# Patient Record
Sex: Female | Born: 1950 | Race: White | Hispanic: No | Smoking: Never smoker
Health system: Southern US, Community
[De-identification: ages and names within clinical notes are randomized; demographics above are authoritative.]

## PROBLEM LIST (undated history)

## (undated) DIAGNOSIS — D649 Anemia, unspecified: Secondary | ICD-10-CM

## (undated) DIAGNOSIS — A0472 Enterocolitis due to Clostridium difficile, not specified as recurrent: Secondary | ICD-10-CM

## (undated) DIAGNOSIS — E039 Hypothyroidism, unspecified: Secondary | ICD-10-CM

## (undated) DIAGNOSIS — T783XXA Angioneurotic edema, initial encounter: Secondary | ICD-10-CM

## (undated) DIAGNOSIS — M542 Cervicalgia: Secondary | ICD-10-CM

## (undated) DIAGNOSIS — T4145XA Adverse effect of unspecified anesthetic, initial encounter: Secondary | ICD-10-CM

## (undated) DIAGNOSIS — K589 Irritable bowel syndrome without diarrhea: Secondary | ICD-10-CM

## (undated) DIAGNOSIS — E7800 Pure hypercholesterolemia, unspecified: Secondary | ICD-10-CM

## (undated) DIAGNOSIS — R0902 Hypoxemia: Secondary | ICD-10-CM

## (undated) DIAGNOSIS — G4730 Sleep apnea, unspecified: Secondary | ICD-10-CM

## (undated) DIAGNOSIS — R6510 Systemic inflammatory response syndrome (SIRS) of non-infectious origin without acute organ dysfunction: Secondary | ICD-10-CM

## (undated) DIAGNOSIS — R51 Headache: Secondary | ICD-10-CM

## (undated) DIAGNOSIS — S92352D Displaced fracture of fifth metatarsal bone, left foot, subsequent encounter for fracture with routine healing: Secondary | ICD-10-CM

## (undated) DIAGNOSIS — G8929 Other chronic pain: Secondary | ICD-10-CM

## (undated) DIAGNOSIS — Z86010 Personal history of colonic polyps: Secondary | ICD-10-CM

## (undated) DIAGNOSIS — J479 Bronchiectasis, uncomplicated: Secondary | ICD-10-CM

## (undated) DIAGNOSIS — I82409 Acute embolism and thrombosis of unspecified deep veins of unspecified lower extremity: Secondary | ICD-10-CM

## (undated) DIAGNOSIS — M545 Low back pain: Secondary | ICD-10-CM

## (undated) DIAGNOSIS — T8859XA Other complications of anesthesia, initial encounter: Secondary | ICD-10-CM

## (undated) DIAGNOSIS — K31819 Angiodysplasia of stomach and duodenum without bleeding: Secondary | ICD-10-CM

## (undated) DIAGNOSIS — J189 Pneumonia, unspecified organism: Secondary | ICD-10-CM

## (undated) DIAGNOSIS — F32A Depression, unspecified: Secondary | ICD-10-CM

## (undated) DIAGNOSIS — C50919 Malignant neoplasm of unspecified site of unspecified female breast: Secondary | ICD-10-CM

## (undated) DIAGNOSIS — J45909 Unspecified asthma, uncomplicated: Secondary | ICD-10-CM

## (undated) DIAGNOSIS — F329 Major depressive disorder, single episode, unspecified: Secondary | ICD-10-CM

## (undated) DIAGNOSIS — M1990 Unspecified osteoarthritis, unspecified site: Secondary | ICD-10-CM

## (undated) DIAGNOSIS — K5790 Diverticulosis of intestine, part unspecified, without perforation or abscess without bleeding: Secondary | ICD-10-CM

## (undated) DIAGNOSIS — R0609 Other forms of dyspnea: Secondary | ICD-10-CM

## (undated) DIAGNOSIS — T7840XA Allergy, unspecified, initial encounter: Secondary | ICD-10-CM

## (undated) DIAGNOSIS — E0500 Thyrotoxicosis with diffuse goiter without thyrotoxic crisis or storm: Secondary | ICD-10-CM

## (undated) DIAGNOSIS — M797 Fibromyalgia: Secondary | ICD-10-CM

## (undated) DIAGNOSIS — R0600 Dyspnea, unspecified: Secondary | ICD-10-CM

## (undated) DIAGNOSIS — M0600 Rheumatoid arthritis without rheumatoid factor, unspecified site: Secondary | ICD-10-CM

## (undated) DIAGNOSIS — K219 Gastro-esophageal reflux disease without esophagitis: Secondary | ICD-10-CM

## (undated) DIAGNOSIS — J398 Other specified diseases of upper respiratory tract: Secondary | ICD-10-CM

## (undated) DIAGNOSIS — K449 Diaphragmatic hernia without obstruction or gangrene: Secondary | ICD-10-CM

## (undated) DIAGNOSIS — I82401 Acute embolism and thrombosis of unspecified deep veins of right lower extremity: Secondary | ICD-10-CM

## (undated) DIAGNOSIS — J069 Acute upper respiratory infection, unspecified: Secondary | ICD-10-CM

## (undated) DIAGNOSIS — H269 Unspecified cataract: Secondary | ICD-10-CM

## (undated) DIAGNOSIS — F419 Anxiety disorder, unspecified: Secondary | ICD-10-CM

## (undated) DIAGNOSIS — I2510 Atherosclerotic heart disease of native coronary artery without angina pectoris: Secondary | ICD-10-CM

## (undated) DIAGNOSIS — J4540 Moderate persistent asthma, uncomplicated: Secondary | ICD-10-CM

## (undated) DIAGNOSIS — I509 Heart failure, unspecified: Secondary | ICD-10-CM

## (undated) HISTORY — DX: Anxiety disorder, unspecified: F41.9

## (undated) HISTORY — DX: Rheumatoid arthritis without rheumatoid factor, unspecified site: M06.00

## (undated) HISTORY — PX: SPINE SURGERY: SHX786

## (undated) HISTORY — PX: BREAST SURGERY: SHX581

## (undated) HISTORY — DX: Irritable bowel syndrome, unspecified: K58.9

## (undated) HISTORY — DX: Allergy, unspecified, initial encounter: T78.40XA

## (undated) HISTORY — PX: OTHER SURGICAL HISTORY: SHX169

## (undated) HISTORY — DX: Diverticulosis of intestine, part unspecified, without perforation or abscess without bleeding: K57.90

## (undated) HISTORY — PX: EYE SURGERY: SHX253

## (undated) HISTORY — PX: KNEE ARTHROPLASTY: SHX992

## (undated) HISTORY — PX: NASAL SEPTUM SURGERY: SHX37

## (undated) HISTORY — DX: Personal history of colonic polyps: Z86.010

## (undated) HISTORY — DX: Other chronic pain: G89.29

## (undated) HISTORY — PX: CARPOMETACARPEL (CMC) FUSION OF THUMB WITH AUTOGRAFT FROM RADIUS: SHX5767

## (undated) HISTORY — DX: Gastro-esophageal reflux disease without esophagitis: K21.9

## (undated) HISTORY — DX: Diaphragmatic hernia without obstruction or gangrene: K44.9

## (undated) HISTORY — DX: Moderate persistent asthma, uncomplicated: J45.40

## (undated) HISTORY — PX: COLONOSCOPY: SHX174

## (undated) HISTORY — PX: ABDOMINAL HYSTERECTOMY: SHX81

## (undated) HISTORY — DX: Angiodysplasia of stomach and duodenum without bleeding: K31.819

## (undated) HISTORY — DX: Hypothyroidism, unspecified: E03.9

## (undated) HISTORY — DX: Angioneurotic edema, initial encounter: T78.3XXA

## (undated) HISTORY — PX: ANTERIOR AND POSTERIOR REPAIR: SHX5121

## (undated) HISTORY — DX: Acute upper respiratory infection, unspecified: J06.9

## (undated) HISTORY — PX: CHOLECYSTECTOMY: SHX55

## (undated) HISTORY — PX: TONSILLECTOMY: SUR1361

## (undated) HISTORY — DX: Other specified diseases of upper respiratory tract: J39.8

## (undated) HISTORY — PX: DEBRIDEMENT TENNIS ELBOW: SHX1442

## (undated) HISTORY — DX: Acute embolism and thrombosis of unspecified deep veins of unspecified lower extremity: I82.409

## (undated) HISTORY — DX: Unspecified asthma, uncomplicated: J45.909

## (undated) HISTORY — DX: Hypoxemia: R09.02

## (undated) HISTORY — DX: Cervicalgia: M54.2

## (undated) HISTORY — DX: Bronchiectasis, uncomplicated: J47.9

## (undated) HISTORY — DX: Anemia, unspecified: D64.9

## (undated) HISTORY — DX: Heart failure, unspecified: I50.9

## (undated) HISTORY — PX: APPENDECTOMY: SHX54

## (undated) HISTORY — PX: ESOPHAGOGASTRODUODENOSCOPY: SHX1529

## (undated) HISTORY — DX: Unspecified cataract: H26.9

## (undated) HISTORY — PX: JOINT REPLACEMENT: SHX530

## (undated) HISTORY — DX: Malignant neoplasm of unspecified site of unspecified female breast: C50.919

---
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
## 1978-10-21 HISTORY — PX: NASAL SEPTUM SURGERY: SHX37

## 1986-02-19 HISTORY — PX: VESICOVAGINAL FISTULA CLOSURE W/ TAH: SUR271

## 1988-10-20 HISTORY — PX: ANTERIOR AND POSTERIOR REPAIR: SHX5121

## 1996-02-20 DIAGNOSIS — C50919 Malignant neoplasm of unspecified site of unspecified female breast: Secondary | ICD-10-CM

## 1996-02-20 HISTORY — DX: Malignant neoplasm of unspecified site of unspecified female breast: C50.919

## 1996-02-20 HISTORY — PX: BREAST LUMPECTOMY: SHX2

## 1997-07-30 ENCOUNTER — Other Ambulatory Visit: Admission: RE | Admit: 1997-07-30 | Discharge: 1997-07-30 | Payer: Self-pay | Admitting: Obstetrics and Gynecology

## 1997-10-27 ENCOUNTER — Encounter: Admission: RE | Admit: 1997-10-27 | Discharge: 1998-01-25 | Payer: Self-pay | Admitting: *Deleted

## 1998-01-27 ENCOUNTER — Encounter: Admission: RE | Admit: 1998-01-27 | Discharge: 1998-04-27 | Payer: Self-pay | Admitting: *Deleted

## 1998-08-02 ENCOUNTER — Other Ambulatory Visit: Admission: RE | Admit: 1998-08-02 | Discharge: 1998-08-02 | Payer: Self-pay | Admitting: Obstetrics and Gynecology

## 1998-08-03 ENCOUNTER — Encounter: Admission: RE | Admit: 1998-08-03 | Discharge: 1998-08-18 | Payer: Self-pay | Admitting: *Deleted

## 1999-02-23 ENCOUNTER — Ambulatory Visit: Admission: RE | Admit: 1999-02-23 | Discharge: 1999-02-23 | Payer: Self-pay | Admitting: Internal Medicine

## 1999-07-26 ENCOUNTER — Encounter: Payer: Self-pay | Admitting: Hematology and Oncology

## 1999-07-26 ENCOUNTER — Ambulatory Visit (HOSPITAL_COMMUNITY): Admission: RE | Admit: 1999-07-26 | Discharge: 1999-07-26 | Payer: Self-pay | Admitting: Hematology and Oncology

## 1999-08-03 ENCOUNTER — Encounter: Admission: RE | Admit: 1999-08-03 | Discharge: 1999-08-03 | Payer: Self-pay | Admitting: Hematology and Oncology

## 1999-08-03 ENCOUNTER — Encounter: Payer: Self-pay | Admitting: Hematology and Oncology

## 1999-08-03 ENCOUNTER — Other Ambulatory Visit: Admission: RE | Admit: 1999-08-03 | Discharge: 1999-08-03 | Payer: Self-pay | Admitting: *Deleted

## 1999-10-18 ENCOUNTER — Encounter: Payer: Self-pay | Admitting: Hematology and Oncology

## 1999-10-18 ENCOUNTER — Ambulatory Visit (HOSPITAL_COMMUNITY): Admission: RE | Admit: 1999-10-18 | Discharge: 1999-10-18 | Payer: Self-pay | Admitting: Hematology and Oncology

## 2000-06-05 ENCOUNTER — Encounter: Admission: RE | Admit: 2000-06-05 | Discharge: 2000-09-03 | Payer: Self-pay | Admitting: Neurological Surgery

## 2000-08-05 ENCOUNTER — Encounter: Payer: Self-pay | Admitting: *Deleted

## 2000-08-05 ENCOUNTER — Encounter: Admission: RE | Admit: 2000-08-05 | Discharge: 2000-08-05 | Payer: Self-pay | Admitting: *Deleted

## 2000-08-05 ENCOUNTER — Other Ambulatory Visit: Admission: RE | Admit: 2000-08-05 | Discharge: 2000-08-05 | Payer: Self-pay | Admitting: *Deleted

## 2000-09-04 ENCOUNTER — Encounter: Admission: RE | Admit: 2000-09-04 | Discharge: 2000-09-17 | Payer: Self-pay | Admitting: Neurological Surgery

## 2000-10-22 ENCOUNTER — Encounter: Admission: RE | Admit: 2000-10-22 | Discharge: 2000-11-11 | Payer: Self-pay | Admitting: *Deleted

## 2000-11-29 ENCOUNTER — Encounter: Payer: Self-pay | Admitting: Neurological Surgery

## 2000-11-29 ENCOUNTER — Ambulatory Visit (HOSPITAL_COMMUNITY): Admission: RE | Admit: 2000-11-29 | Discharge: 2000-11-29 | Payer: Self-pay | Admitting: Neurological Surgery

## 2000-12-03 ENCOUNTER — Encounter: Payer: Self-pay | Admitting: Internal Medicine

## 2001-01-17 ENCOUNTER — Encounter: Payer: Self-pay | Admitting: Internal Medicine

## 2001-02-03 ENCOUNTER — Encounter: Payer: Self-pay | Admitting: Neurosurgery

## 2001-02-03 ENCOUNTER — Ambulatory Visit (HOSPITAL_COMMUNITY): Admission: RE | Admit: 2001-02-03 | Discharge: 2001-02-03 | Payer: Self-pay | Admitting: Neurosurgery

## 2001-02-10 ENCOUNTER — Encounter: Payer: Self-pay | Admitting: Internal Medicine

## 2001-02-10 ENCOUNTER — Ambulatory Visit (HOSPITAL_COMMUNITY): Admission: RE | Admit: 2001-02-10 | Discharge: 2001-02-10 | Payer: Self-pay | Admitting: Gastroenterology

## 2001-02-19 HISTORY — PX: CERVICAL FUSION: SHX112

## 2001-03-05 ENCOUNTER — Encounter: Payer: Self-pay | Admitting: Neurosurgery

## 2001-03-10 ENCOUNTER — Encounter: Payer: Self-pay | Admitting: Neurosurgery

## 2001-03-10 ENCOUNTER — Ambulatory Visit (HOSPITAL_COMMUNITY): Admission: RE | Admit: 2001-03-10 | Discharge: 2001-03-11 | Payer: Self-pay | Admitting: Neurosurgery

## 2001-05-13 ENCOUNTER — Encounter: Payer: Self-pay | Admitting: Neurosurgery

## 2001-05-13 ENCOUNTER — Ambulatory Visit (HOSPITAL_COMMUNITY): Admission: RE | Admit: 2001-05-13 | Discharge: 2001-05-13 | Payer: Self-pay | Admitting: Neurosurgery

## 2001-08-06 ENCOUNTER — Encounter: Admission: RE | Admit: 2001-08-06 | Discharge: 2001-08-06 | Payer: Self-pay | Admitting: *Deleted

## 2001-08-06 ENCOUNTER — Other Ambulatory Visit: Admission: RE | Admit: 2001-08-06 | Discharge: 2001-08-06 | Payer: Self-pay | Admitting: Obstetrics and Gynecology

## 2001-08-06 ENCOUNTER — Encounter: Payer: Self-pay | Admitting: *Deleted

## 2001-10-20 HISTORY — PX: CERVICAL DISCECTOMY: SHX98

## 2002-02-19 HISTORY — PX: POSTERIOR CERVICAL FUSION/FORAMINOTOMY: SHX5038

## 2002-08-10 ENCOUNTER — Encounter: Admission: RE | Admit: 2002-08-10 | Discharge: 2002-08-10 | Payer: Self-pay | Admitting: Obstetrics and Gynecology

## 2002-08-10 ENCOUNTER — Other Ambulatory Visit: Admission: RE | Admit: 2002-08-10 | Discharge: 2002-08-10 | Payer: Self-pay | Admitting: Obstetrics and Gynecology

## 2002-08-10 ENCOUNTER — Encounter: Payer: Self-pay | Admitting: Obstetrics and Gynecology

## 2002-11-19 ENCOUNTER — Encounter: Admission: RE | Admit: 2002-11-19 | Discharge: 2002-11-19 | Payer: Self-pay | Admitting: Neurosurgery

## 2002-11-19 ENCOUNTER — Encounter: Payer: Self-pay | Admitting: Neurosurgery

## 2003-04-12 ENCOUNTER — Encounter: Admission: RE | Admit: 2003-04-12 | Discharge: 2003-04-12 | Payer: Self-pay | Admitting: Neurosurgery

## 2003-08-12 ENCOUNTER — Encounter: Admission: RE | Admit: 2003-08-12 | Discharge: 2003-08-12 | Payer: Self-pay | Admitting: Obstetrics and Gynecology

## 2004-02-29 ENCOUNTER — Encounter: Admission: RE | Admit: 2004-02-29 | Discharge: 2004-02-29 | Payer: Self-pay | Admitting: Neurosurgery

## 2004-03-08 ENCOUNTER — Ambulatory Visit: Payer: Self-pay | Admitting: Internal Medicine

## 2004-04-26 ENCOUNTER — Ambulatory Visit: Payer: Self-pay | Admitting: Internal Medicine

## 2004-05-05 ENCOUNTER — Ambulatory Visit: Payer: Self-pay | Admitting: Internal Medicine

## 2004-06-05 ENCOUNTER — Ambulatory Visit: Payer: Self-pay | Admitting: Internal Medicine

## 2004-08-14 ENCOUNTER — Encounter: Admission: RE | Admit: 2004-08-14 | Discharge: 2004-08-14 | Payer: Self-pay | Admitting: Obstetrics and Gynecology

## 2004-11-06 ENCOUNTER — Ambulatory Visit: Payer: Self-pay | Admitting: Internal Medicine

## 2005-03-14 ENCOUNTER — Encounter: Payer: Self-pay | Admitting: Internal Medicine

## 2005-04-20 ENCOUNTER — Encounter: Payer: Self-pay | Admitting: Internal Medicine

## 2005-04-20 LAB — HM COLONOSCOPY

## 2005-06-05 ENCOUNTER — Ambulatory Visit (HOSPITAL_COMMUNITY): Admission: RE | Admit: 2005-06-05 | Discharge: 2005-06-05 | Payer: Self-pay | Admitting: Family Medicine

## 2005-08-23 ENCOUNTER — Encounter: Admission: RE | Admit: 2005-08-23 | Discharge: 2005-08-23 | Payer: Self-pay | Admitting: Obstetrics and Gynecology

## 2005-10-27 IMAGING — CR DG MYELOGRAM CERVICAL
5 series · 5 of 5 positions shown · IV contrast (omnipaque)
Comparison: none

CLINICAL DATA: Neck pain.  Bilateral shoulder and arm pain.  Back pain.  Bilateral leg pain in a patient with prior cervical fusion.
 CERVICAL MYELOGRAM
 A lumbar puncture was performed from a left sided approach to the midline at L2-3 using a 22 gauge spinal needle.  8 cc of Omnipaque 300 were instilled and directed into the cervical region by patient positioning.  
 The spinal canal appears widely patent.   I do not see any anterior extradural defects.  Ample subarachnoid space surrounds the cervical spinal cord.  The patient has a fusion done via anterior cervical diskectomy with anterior plate and screws at C3-4 which appears grossly solid.  The patient has had prior anterior and posterior fusion at C6-7 which appears grossly solid.  The nerve root sleeves fill normally bilaterally.  
 IMPRESSION
 Normal cervical myelogram in a patient who has had prior fusion at C3-4 and C6-7.  The spinal canal appears widely patent and there is no evidence of nerve root compression.
 POST MYELOGRAM CT SCAN ? CERVICAL SPINE
 Spiral scanning was performed from the skull base to T1.
 Foramen magnum and C1-2:  No abnormality.
 C2-3:  Normal interspace.  No facet disease.
 C3-4:  Prior anterior cervical diskectomy and fusion with an anterior plate.  The fusion appears solid.  The spinal canal and neural foramen are widely patent.  The facet joint on the left is solidly fused. There may also be fusion of the facet joint on the right.  
 C4-5:  There is a minor disk bulge that causes slight indentation upon the ventral subarachnoid space, but does not compress or deform the cord.  I do not see any foraminal compromise. There appears to be free exiting of the C5 nerve roots.  The facets do not show any degenerative change.  
 C5-6:  There is no disk bulge or herniation.  The canal and foramina are widely patent.  No evidence of facet disease. 
 C6-7:  The patient has had an old anterior fusion which appears solid.  There is a posterior fusion with wires around the spinous processes.  The facets appear solidly fused.  The spinal canal and foramina are widely patent.  
 C7-T1:  Normal interspace without evidence of canal or foraminal compromise.  The facets appear normal. 
 1.  Solid fusion at C3-4 with wide patency of the canal and foramina.
 2.  Small disk bulge at C4-5, but no herniation, stenosis, or foraminal compromise.  The facets appear normal. 
 3.  The C5-6 interspace appears normal.
 4.  Good appearance at the C6-7 level which appears solidly fused both anterior and posterior with wide patency of the canal and foramina.
 5.  The C7-T1 disk space is normal without evidence of secondary degenerative changes.
 CT MULTIPLANAR REFORMATION
 Sagittal and coronal reformations were done which aid in depiction of the above described findings.

[view not recorded (1 of 5)]
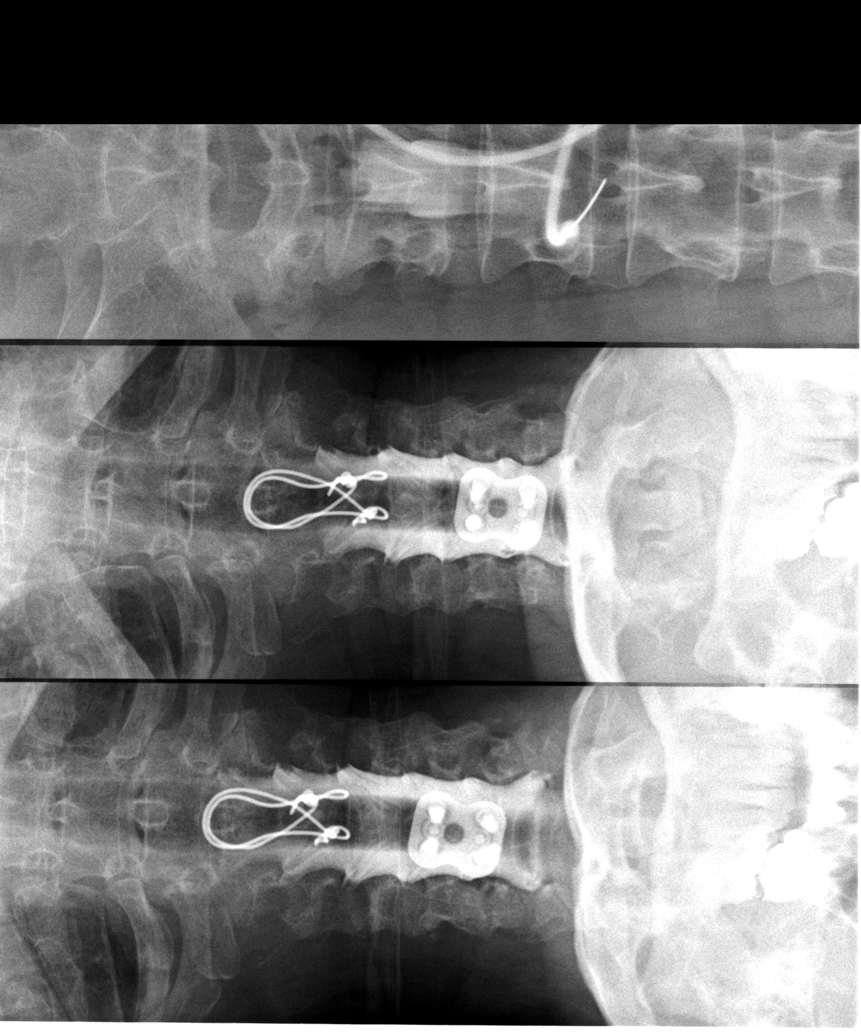

[view not recorded (2 of 5)]
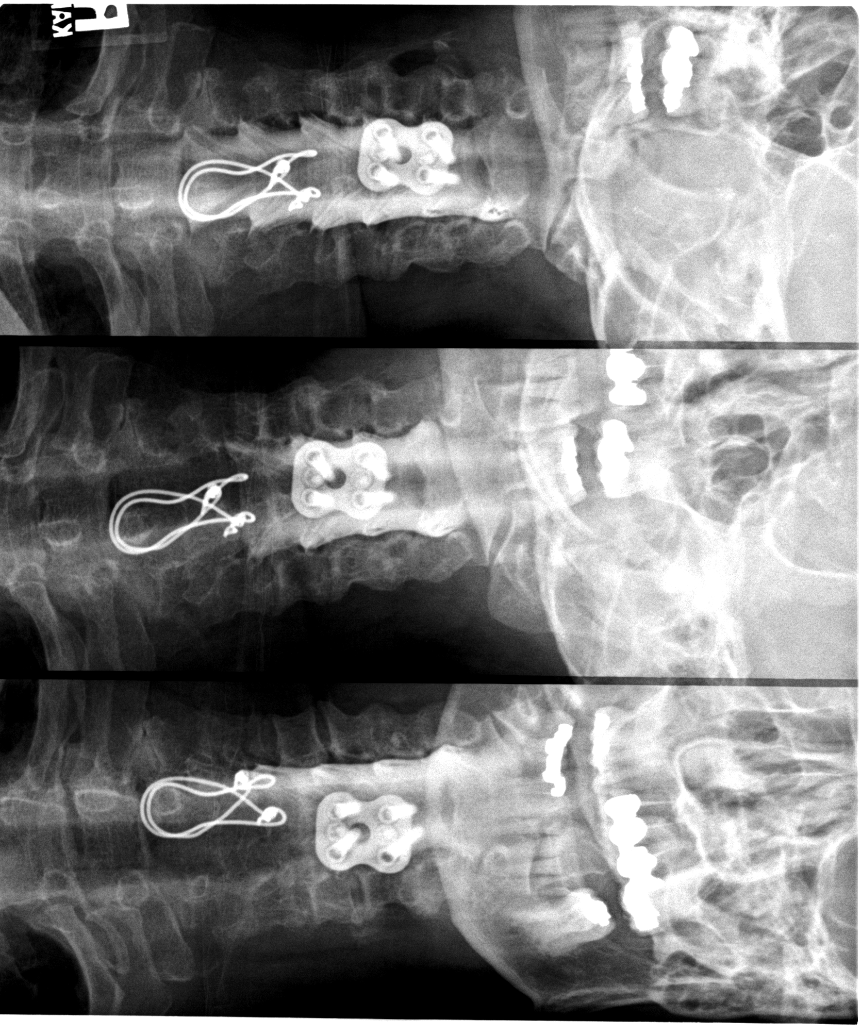

[view not recorded (3 of 5)]
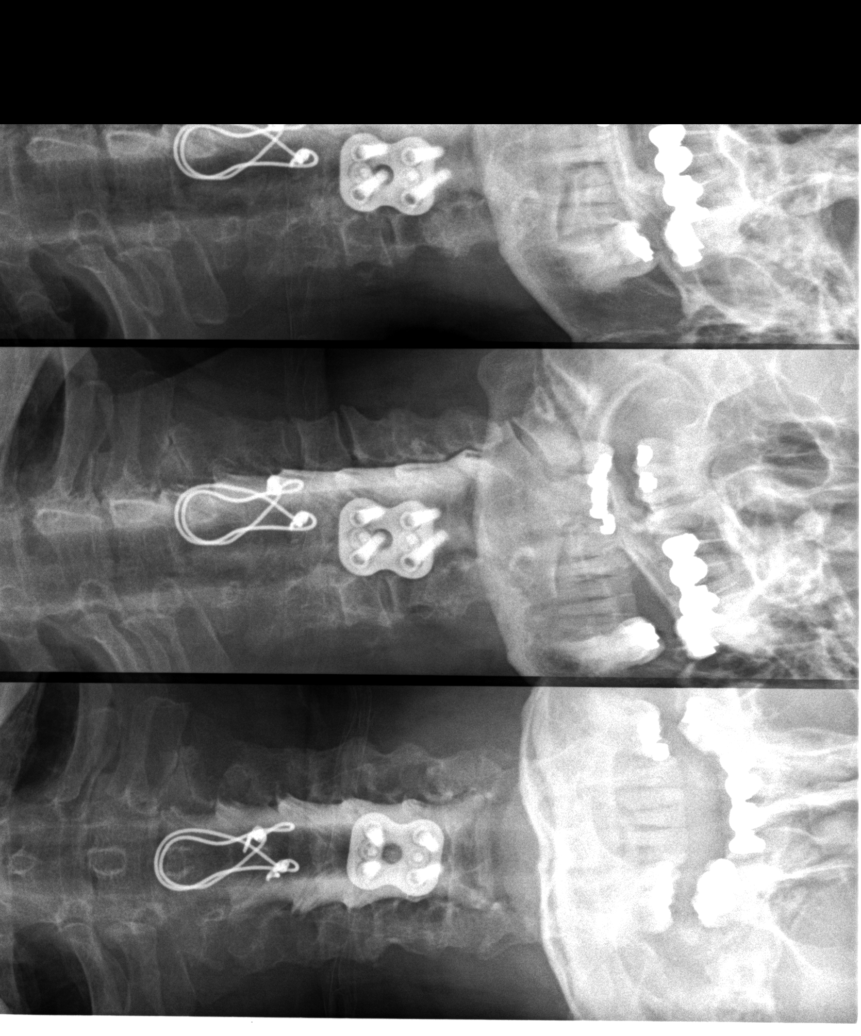

[view not recorded (4 of 5)]
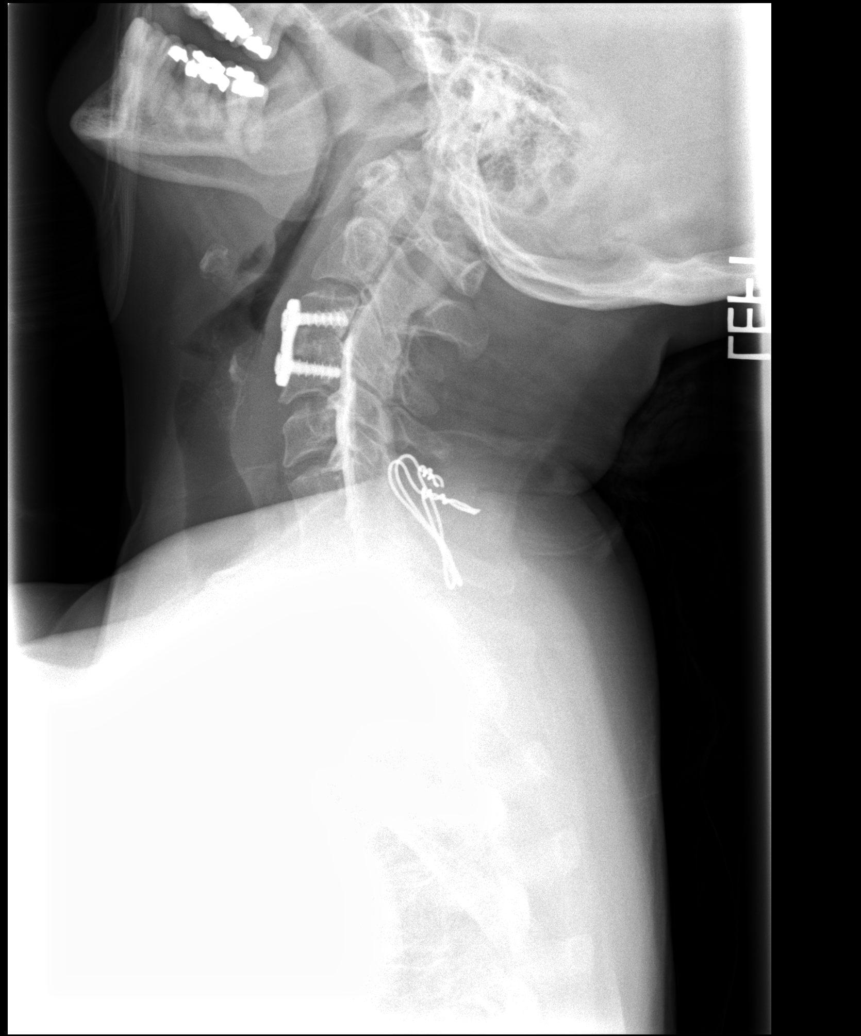

[view not recorded (5 of 5)]
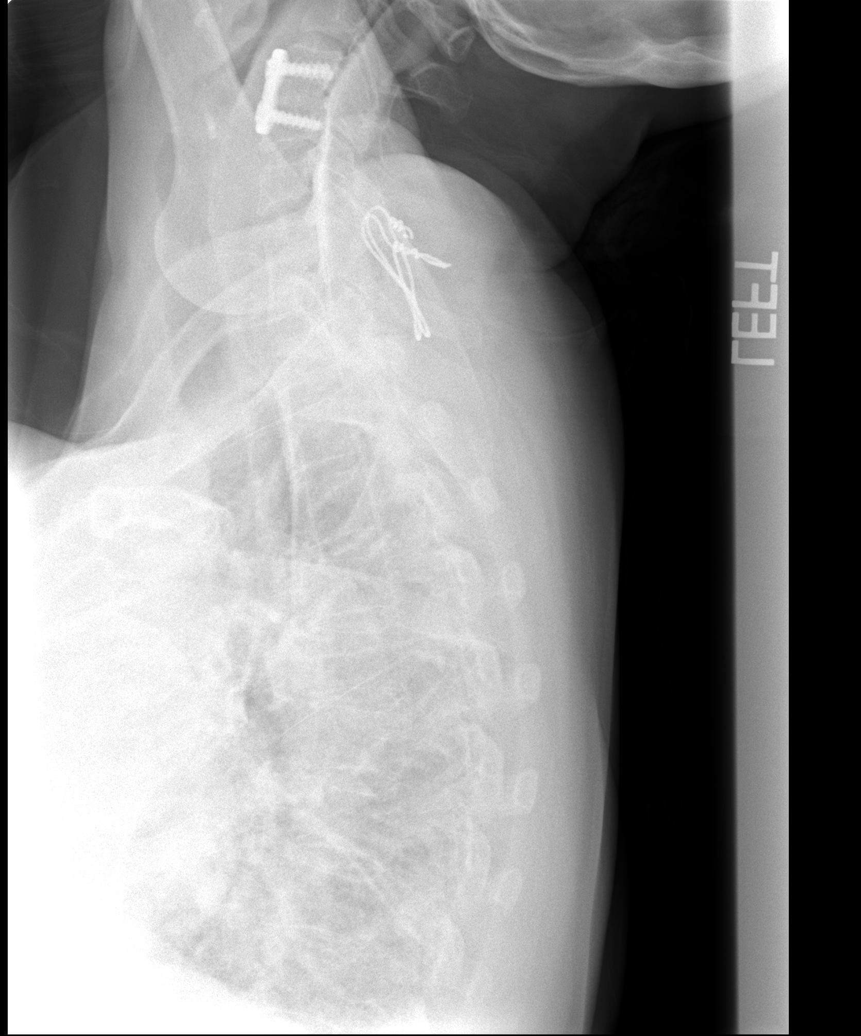

[5 of 5 positions shown; findings below may reference images not displayed]

## 2005-12-10 ENCOUNTER — Ambulatory Visit (HOSPITAL_COMMUNITY): Admission: RE | Admit: 2005-12-10 | Discharge: 2005-12-10 | Payer: Self-pay | Admitting: Family Medicine

## 2006-02-06 ENCOUNTER — Ambulatory Visit (HOSPITAL_COMMUNITY): Admission: RE | Admit: 2006-02-06 | Discharge: 2006-02-06 | Payer: Self-pay | Admitting: Anesthesiology

## 2006-09-02 ENCOUNTER — Encounter: Admission: RE | Admit: 2006-09-02 | Discharge: 2006-09-02 | Payer: Self-pay | Admitting: Obstetrics and Gynecology

## 2006-09-05 ENCOUNTER — Ambulatory Visit (HOSPITAL_COMMUNITY): Admission: RE | Admit: 2006-09-05 | Discharge: 2006-09-05 | Payer: Self-pay | Admitting: Family Medicine
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
## 2006-10-02 ENCOUNTER — Telehealth: Payer: Self-pay | Admitting: Internal Medicine

## 2006-11-12 DIAGNOSIS — M810 Age-related osteoporosis without current pathological fracture: Secondary | ICD-10-CM | POA: Insufficient documentation

## 2006-11-12 DIAGNOSIS — K219 Gastro-esophageal reflux disease without esophagitis: Secondary | ICD-10-CM | POA: Insufficient documentation

## 2006-11-12 DIAGNOSIS — J3089 Other allergic rhinitis: Secondary | ICD-10-CM | POA: Insufficient documentation

## 2006-11-12 DIAGNOSIS — E0590 Thyrotoxicosis, unspecified without thyrotoxic crisis or storm: Secondary | ICD-10-CM | POA: Insufficient documentation

## 2006-11-12 DIAGNOSIS — Z853 Personal history of malignant neoplasm of breast: Secondary | ICD-10-CM | POA: Insufficient documentation

## 2006-11-12 DIAGNOSIS — M542 Cervicalgia: Secondary | ICD-10-CM | POA: Insufficient documentation

## 2006-11-12 DIAGNOSIS — J302 Other seasonal allergic rhinitis: Secondary | ICD-10-CM | POA: Insufficient documentation

## 2007-01-06 ENCOUNTER — Encounter: Payer: Self-pay | Admitting: Internal Medicine

## 2007-03-27 ENCOUNTER — Encounter: Payer: Self-pay | Admitting: Internal Medicine

## 2007-03-31 ENCOUNTER — Ambulatory Visit (HOSPITAL_COMMUNITY): Admission: RE | Admit: 2007-03-31 | Discharge: 2007-03-31 | Payer: Self-pay | Admitting: Gastroenterology

## 2007-03-31 ENCOUNTER — Encounter: Payer: Self-pay | Admitting: Internal Medicine

## 2007-04-02 ENCOUNTER — Encounter: Payer: Self-pay | Admitting: Internal Medicine

## 2007-04-02 ENCOUNTER — Ambulatory Visit (HOSPITAL_COMMUNITY): Admission: RE | Admit: 2007-04-02 | Discharge: 2007-04-02 | Payer: Self-pay | Admitting: Gastroenterology

## 2007-04-11 ENCOUNTER — Encounter: Payer: Self-pay | Admitting: Internal Medicine

## 2007-04-11 ENCOUNTER — Ambulatory Visit (HOSPITAL_COMMUNITY): Admission: RE | Admit: 2007-04-11 | Discharge: 2007-04-11 | Payer: Self-pay | Admitting: Gastroenterology

## 2007-04-15 ENCOUNTER — Encounter: Payer: Self-pay | Admitting: Internal Medicine

## 2007-04-22 ENCOUNTER — Encounter: Payer: Self-pay | Admitting: Internal Medicine

## 2007-04-24 ENCOUNTER — Encounter: Payer: Self-pay | Admitting: Internal Medicine

## 2007-05-02 ENCOUNTER — Encounter: Payer: Self-pay | Admitting: Internal Medicine

## 2007-06-11 ENCOUNTER — Ambulatory Visit (HOSPITAL_COMMUNITY): Admission: RE | Admit: 2007-06-11 | Discharge: 2007-06-12 | Payer: Self-pay | Admitting: Surgery

## 2007-06-11 ENCOUNTER — Encounter (INDEPENDENT_AMBULATORY_CARE_PROVIDER_SITE_OTHER): Payer: Self-pay | Admitting: Surgery

## 2007-06-11 ENCOUNTER — Encounter: Payer: Self-pay | Admitting: Internal Medicine

## 2007-07-02 ENCOUNTER — Encounter: Payer: Self-pay | Admitting: Internal Medicine

## 2007-07-08 ENCOUNTER — Encounter: Payer: Self-pay | Admitting: Internal Medicine

## 2007-08-19 ENCOUNTER — Encounter: Payer: Self-pay | Admitting: Internal Medicine

## 2007-09-03 ENCOUNTER — Encounter: Admission: RE | Admit: 2007-09-03 | Discharge: 2007-09-03 | Payer: Self-pay | Admitting: Obstetrics and Gynecology

## 2007-09-10 ENCOUNTER — Encounter: Payer: Self-pay | Admitting: Internal Medicine

## 2007-09-10 ENCOUNTER — Encounter: Admission: RE | Admit: 2007-09-10 | Discharge: 2007-09-10 | Payer: Self-pay | Admitting: Obstetrics and Gynecology

## 2007-09-18 ENCOUNTER — Encounter: Admission: RE | Admit: 2007-09-18 | Discharge: 2007-09-18 | Payer: Self-pay | Admitting: Obstetrics and Gynecology

## 2007-10-21 ENCOUNTER — Ambulatory Visit (HOSPITAL_COMMUNITY): Admission: RE | Admit: 2007-10-21 | Discharge: 2007-10-21 | Payer: Self-pay | Admitting: Family Medicine

## 2007-12-16 ENCOUNTER — Encounter: Payer: Self-pay | Admitting: Internal Medicine
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
## 2008-02-03 ENCOUNTER — Encounter: Payer: Self-pay | Admitting: Internal Medicine

## 2008-02-20 LAB — HM DEXA SCAN

## 2008-03-12 ENCOUNTER — Encounter: Admission: RE | Admit: 2008-03-12 | Discharge: 2008-03-12 | Payer: Self-pay | Admitting: Obstetrics and Gynecology

## 2008-04-20 ENCOUNTER — Ambulatory Visit (HOSPITAL_BASED_OUTPATIENT_CLINIC_OR_DEPARTMENT_OTHER): Admission: RE | Admit: 2008-04-20 | Discharge: 2008-04-21 | Payer: Self-pay | Admitting: Orthopedic Surgery

## 2008-06-04 ENCOUNTER — Encounter: Payer: Self-pay | Admitting: Internal Medicine

## 2008-06-26 IMAGING — CT CT CHEST W/O CM
2 of 4 series · 15 of 36 positions shown, 18 images · IV contrast (agent unspecified)
Comparison: none

CLINICAL DATA: Multiple lung nodules seen on prior CT scan of 06/05/05. 
 CHEST CT WITHOUT CONTRAST:
TECHNIQUE: Multidetector CT imaging of the chest was performed following the standard protocol without IV contrast.

[Series 2: chest routine 5.0 b40f · axial · 0.59mm/px · z∈[-338,-68]mm · 12 of 64 slices shown, 15 images]
[im 5/64  mediastinal]
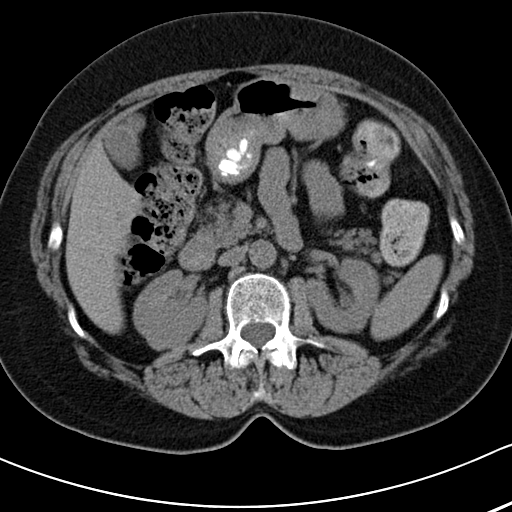
[im 5/64  lung]
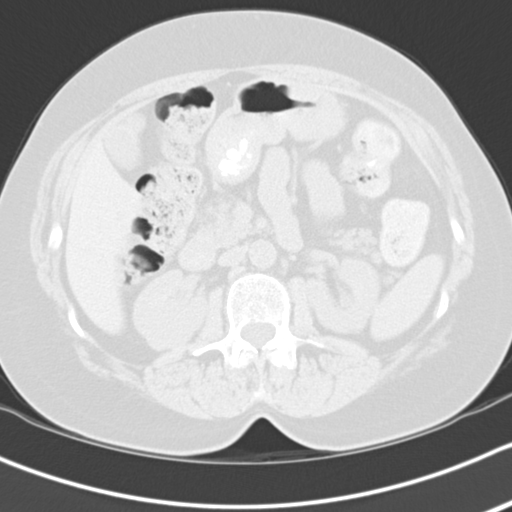
[im 10/64  lung]
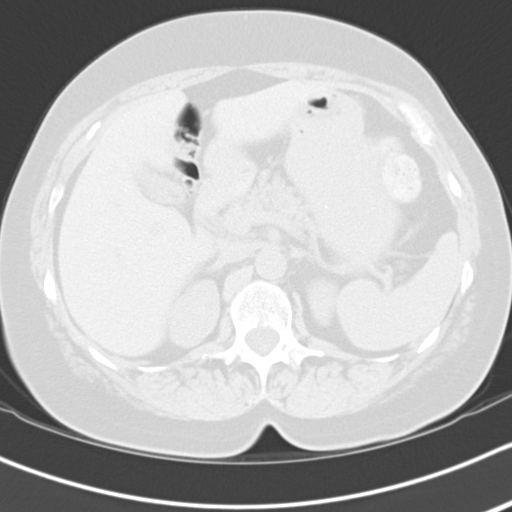
[im 14/64  lung]
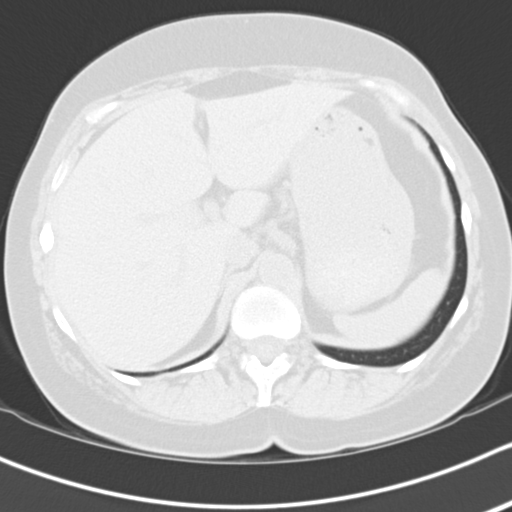
[im 19/64  lung]
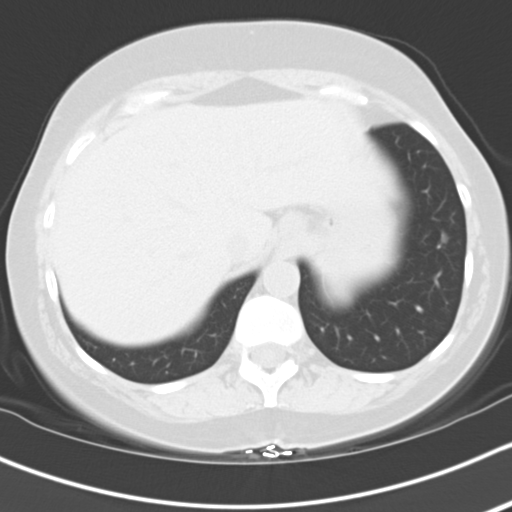
[im 23/64  mediastinal]
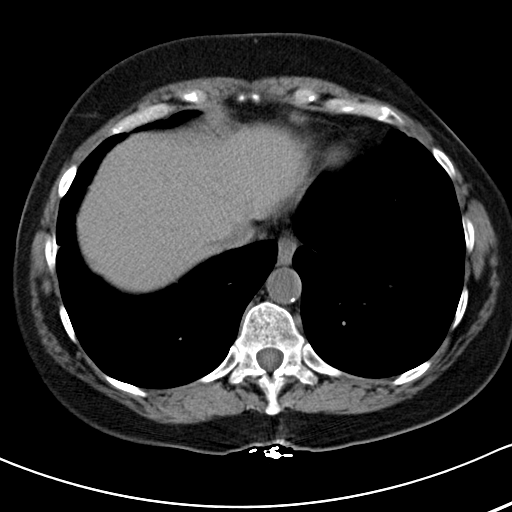
[im 23/64  lung]
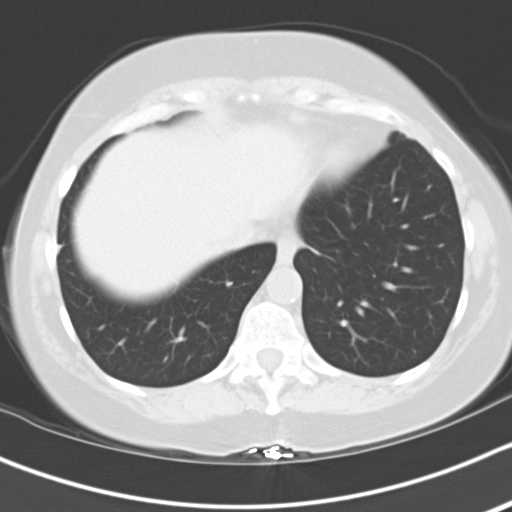
[im 28/64  lung]
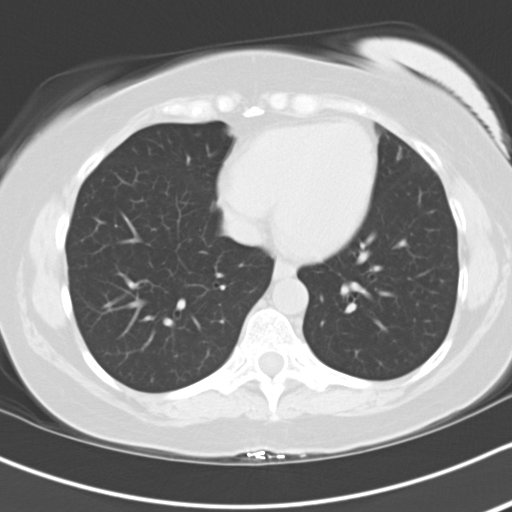
[im 37/64  lung]
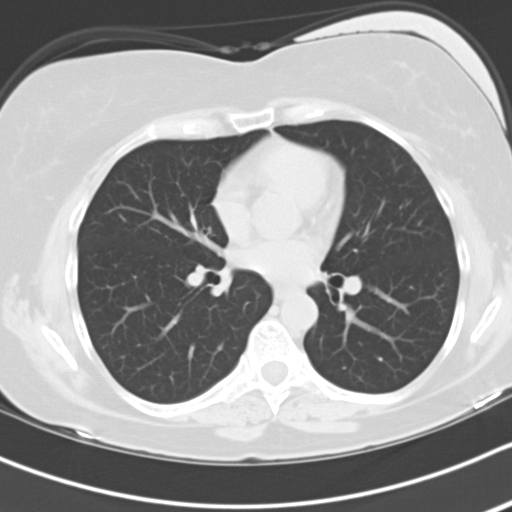
[im 41/64  lung]
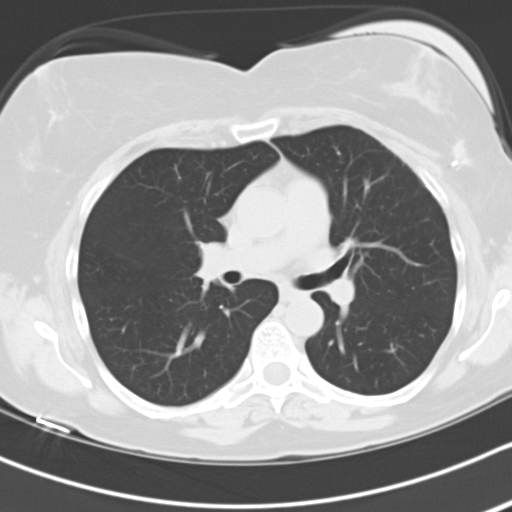
[im 46/64  mediastinal]
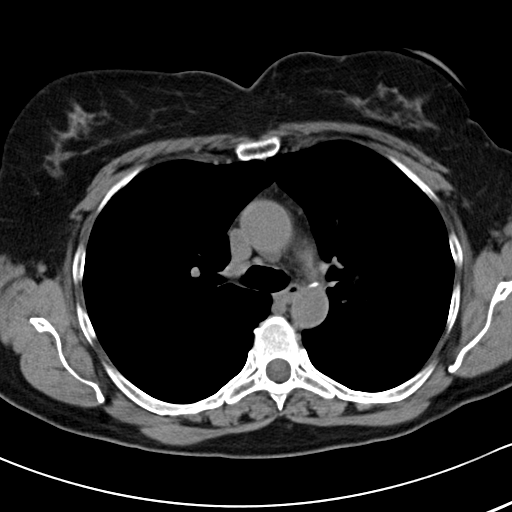
[im 46/64  lung]
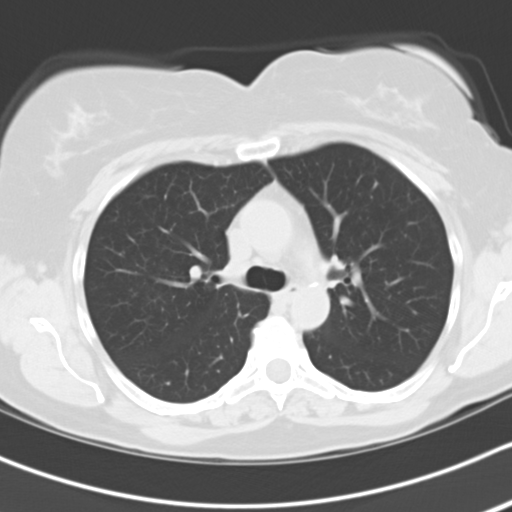
[im 50/64  lung]
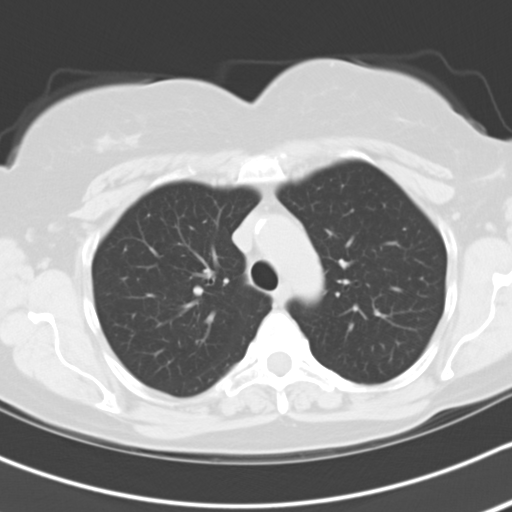
[im 55/64  lung]
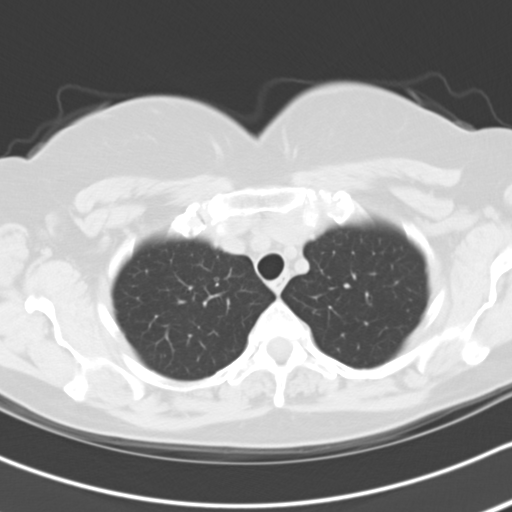
[im 59/64  lung]
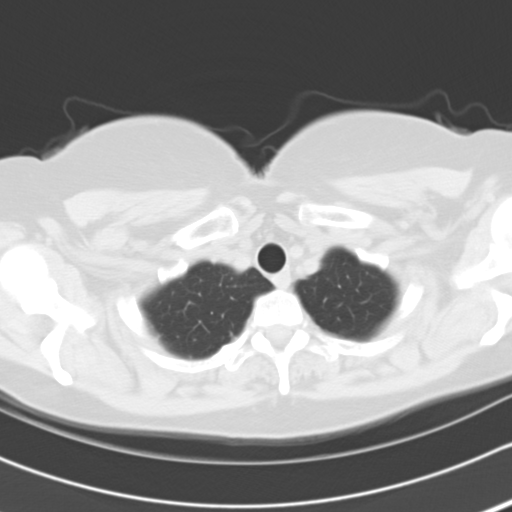

[Series 5: mpr coronal chest · coronal · 0.63mm/px · 3 of 64 slices shown]
[im 13/64  lung]
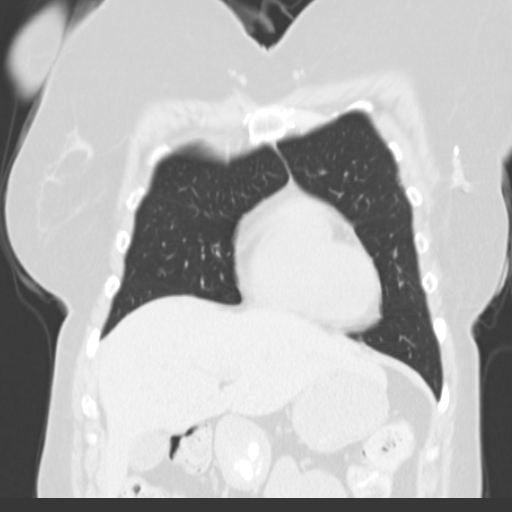
[im 26/64  lung]
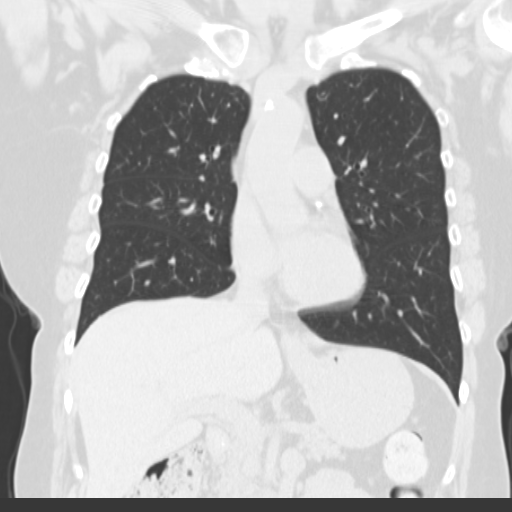
[im 38/64  lung]
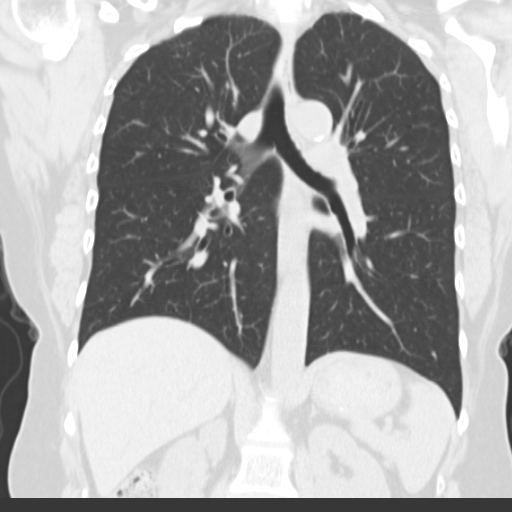

[15 of 36 positions shown; findings below may reference images not displayed]

FINDINGS: Again noted are several small, poorly defined, pulmonary nodules bilaterally with the largest measuring 7 mm in diameter in the left lower lobe on image #47.  There is a stable 4 mm nodule in the lateral aspect of the right lower lobe on image #36.  There are several other vague tiny areas of density in both lungs.  Evidence of previous left breast lumpectomy.  No hilar or mediastinal adenopathy.  Heart size is normal.  Old, well healed fracture of the lateral aspect of the right 9th rib.
IMPRESSION: Stable small pulmonary nodules.  Recommend one additional follow-up in nine months to determine ongoing stability.

## 2008-07-02 ENCOUNTER — Encounter: Payer: Self-pay | Admitting: Internal Medicine

## 2008-08-03 ENCOUNTER — Ambulatory Visit (HOSPITAL_BASED_OUTPATIENT_CLINIC_OR_DEPARTMENT_OTHER): Admission: RE | Admit: 2008-08-03 | Discharge: 2008-08-04 | Payer: Self-pay | Admitting: Orthopedic Surgery

## 2008-08-03 ENCOUNTER — Encounter (INDEPENDENT_AMBULATORY_CARE_PROVIDER_SITE_OTHER): Payer: Self-pay | Admitting: Orthopedic Surgery

## 2008-08-20 ENCOUNTER — Encounter: Admission: RE | Admit: 2008-08-20 | Discharge: 2008-08-20 | Payer: Self-pay | Admitting: Family Medicine

## 2008-08-23 IMAGING — CR DG HAND COMPLETE 3+V*L*
2 series · 2 of 2 positions shown · non-contrast
Comparison: None.

CLINICAL DATA: Bilateral hand pain, especially in the thumbs.
 LEFT HAND ? 3 VIEW:

[view not recorded (1 of 2)]
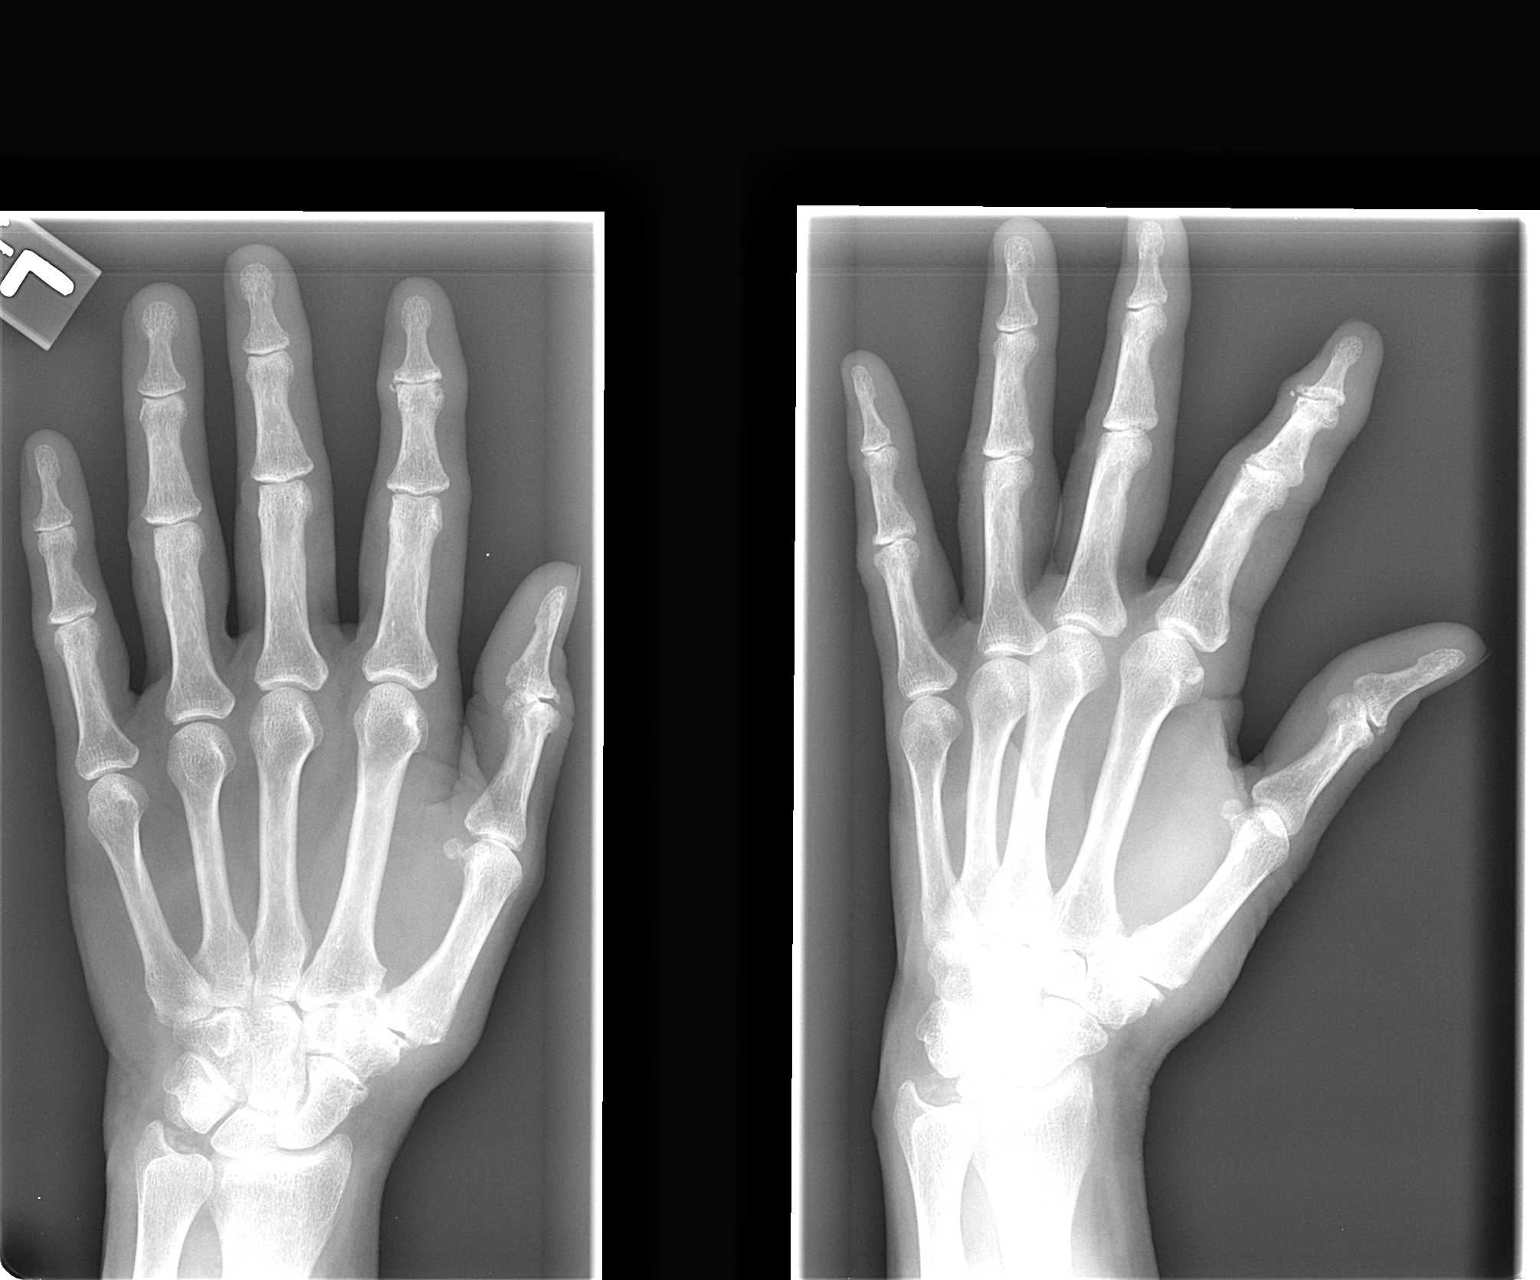

[view not recorded (2 of 2)]
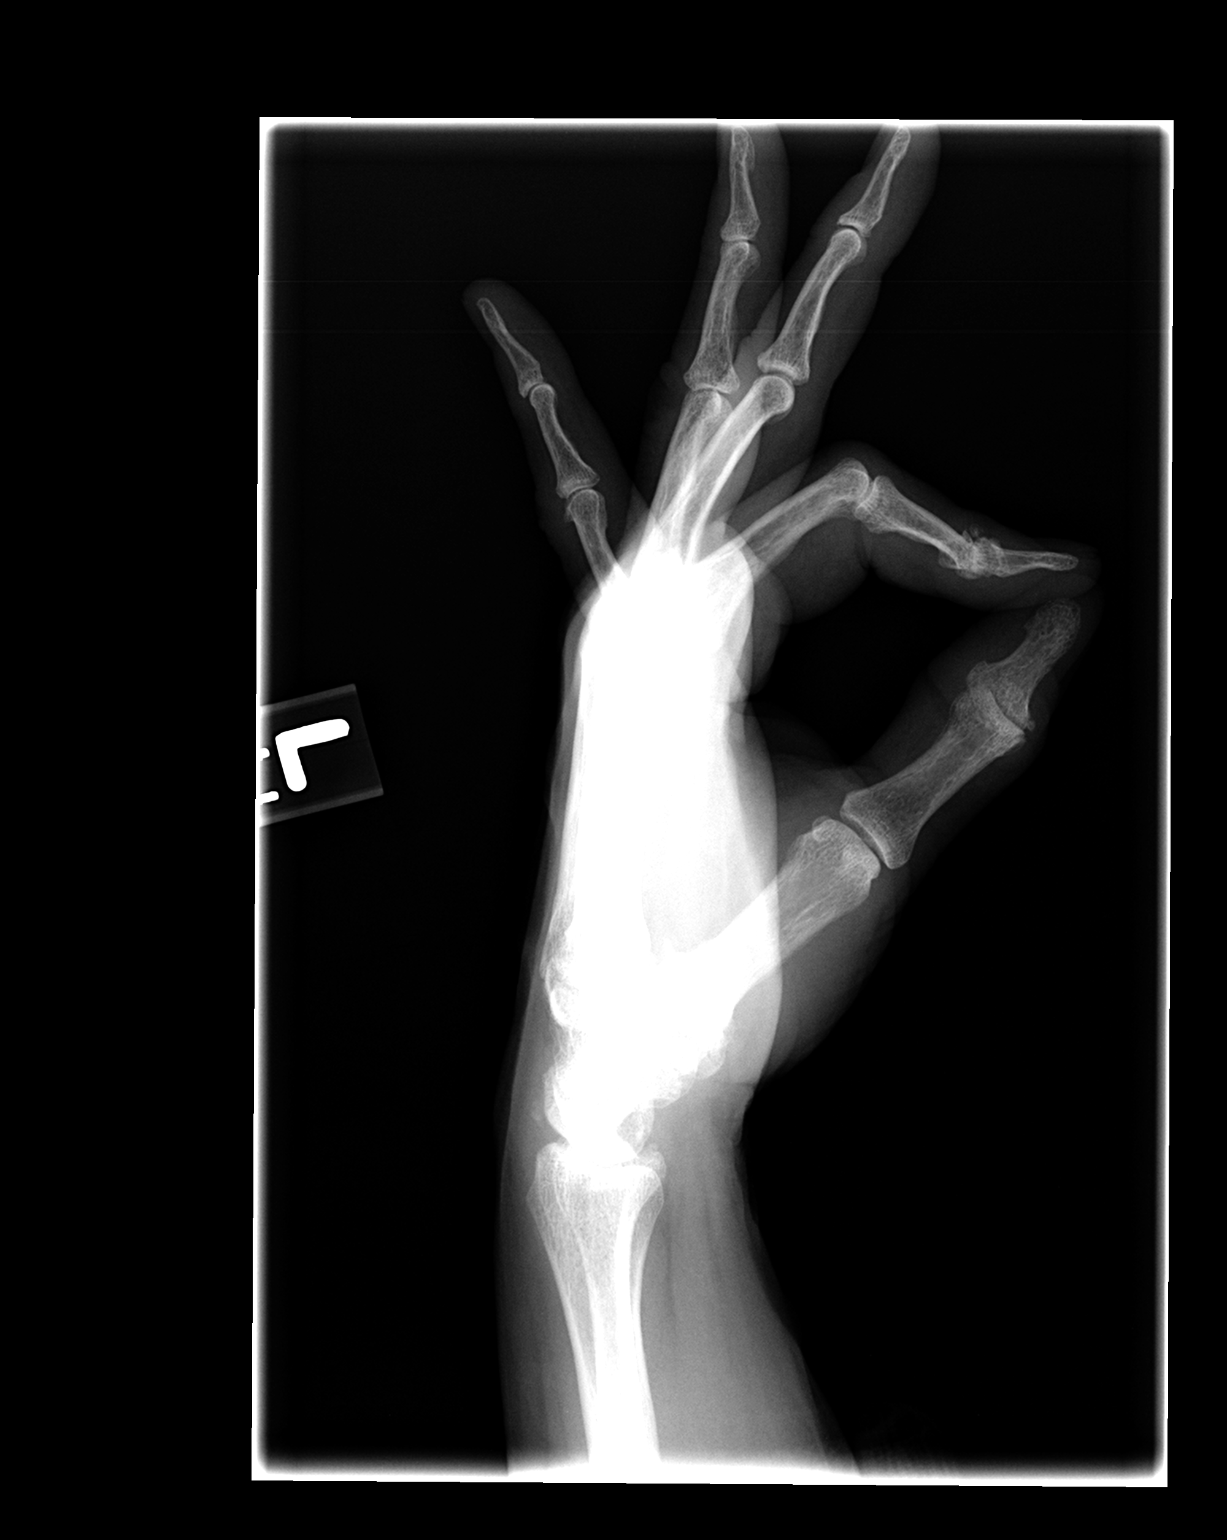

[2 of 2 positions shown; findings below may reference images not displayed]

FINDINGS: There are mild to moderate degenerative changes of the first carpometacarpal joint. There are also some early degenerative changes of the DIP joints, especially the second finger. No evidence for inflammatory arthritis. Soft tissues normal. 
 Also noted is some chondrocalcinosis and/or periarticular calcifications between the ulna and the adjacent carpal bones.  This raises the question of pseudogout.
IMPRESSION: 1.  There are changes of osteoarthritis as described above.
 2.  There are articular or periarticular calcifications raising the question of pseudogout.
 RIGHT HAND ? 3 VIEW:
FINDINGS: There are mild degenerative changes of the first MCP joint and some degenerative changes of the DIP joints, similar to the left hand. There are no soft tissue calcifications as were noted on the left.  There are no erosions to suggest synovitis or inflammatory arthritis.
IMPRESSION: Mild to moderate changes of osteoarthritis as described above.

## 2008-08-23 IMAGING — CR DG HAND COMPLETE 3+V*R*
2 series · 2 of 2 positions shown · non-contrast
Comparison: None.

CLINICAL DATA: Bilateral hand pain, especially in the thumbs.
 LEFT HAND ? 3 VIEW:

[view not recorded (1 of 2)]
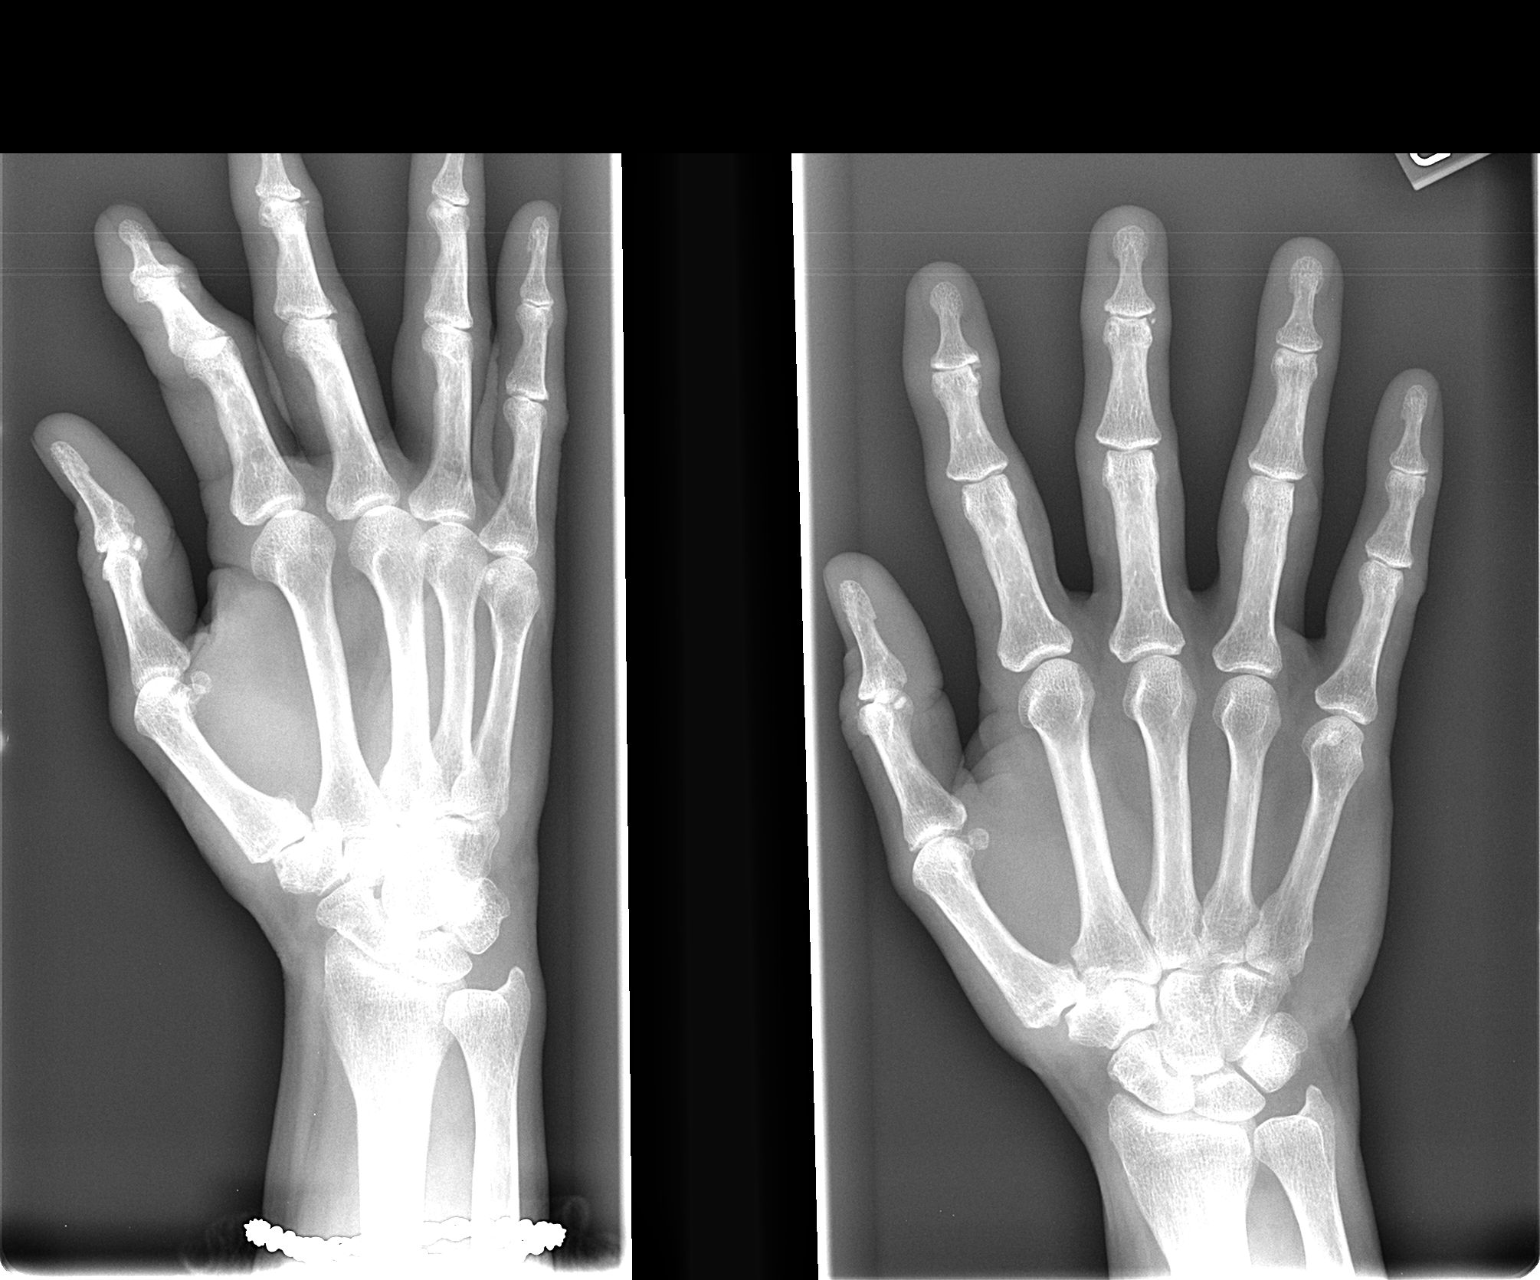

[view not recorded (2 of 2)]
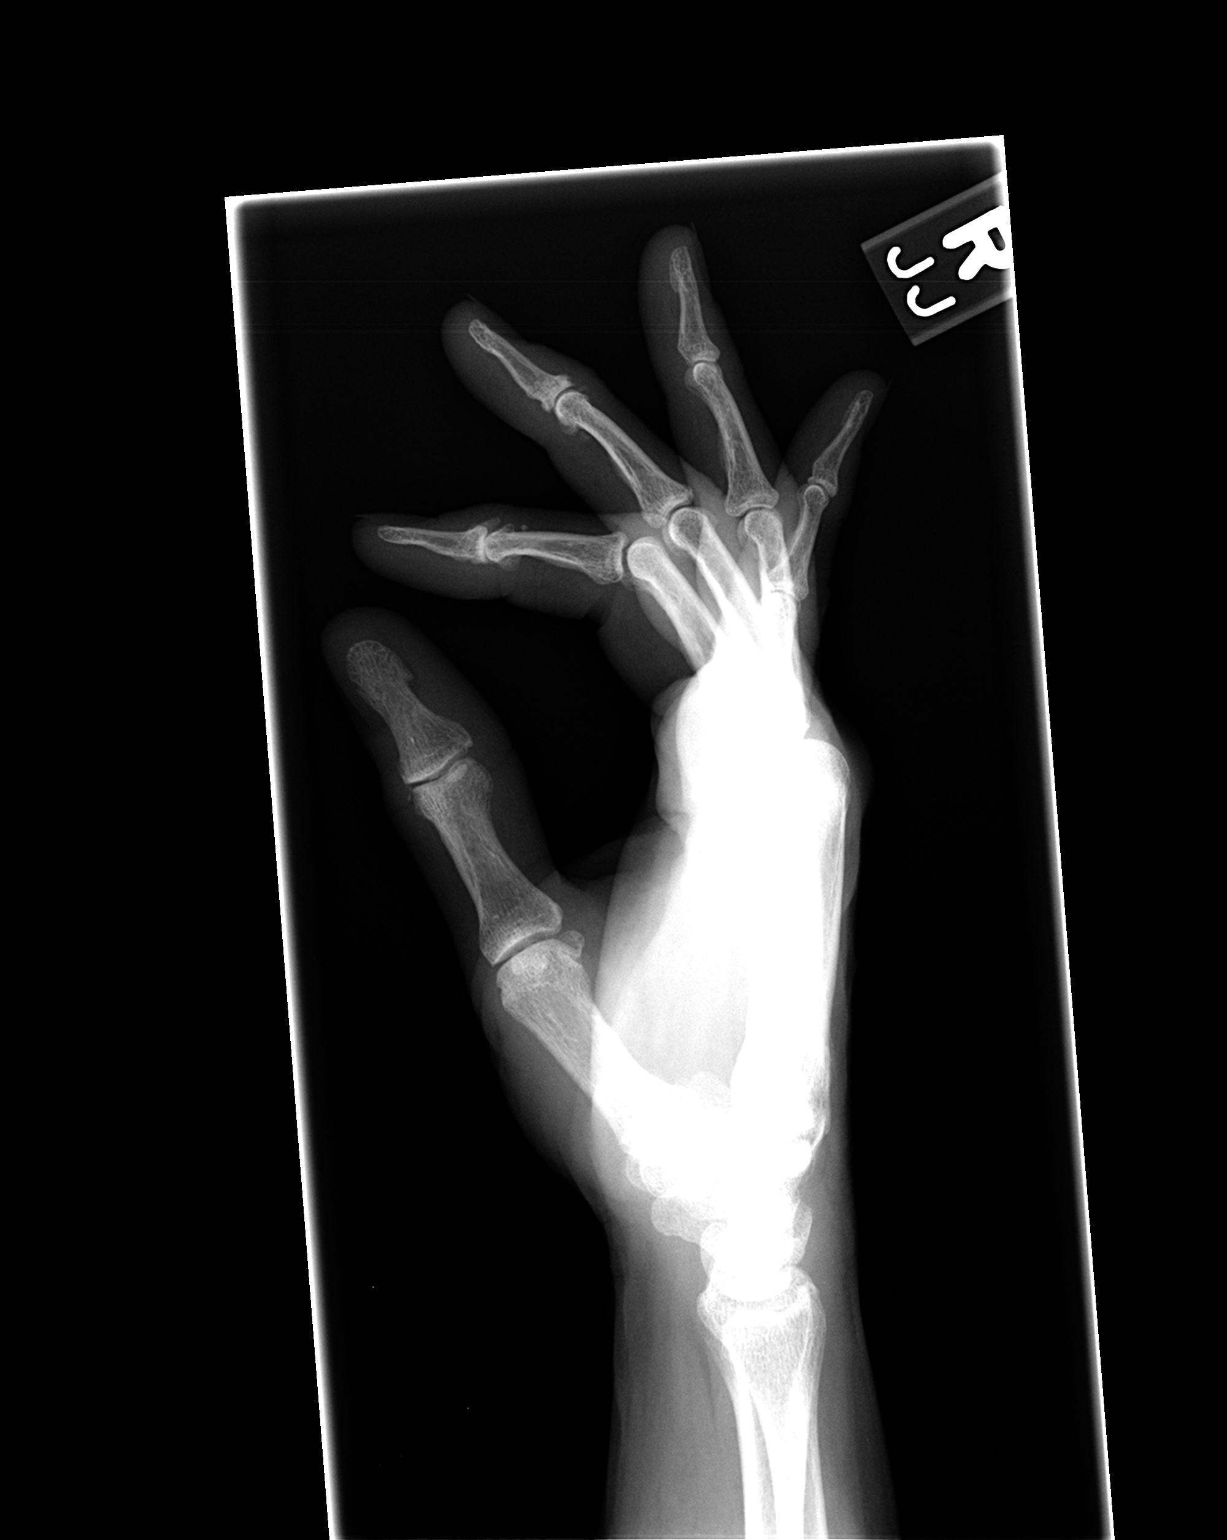

[2 of 2 positions shown; findings below may reference images not displayed]

FINDINGS: There are mild to moderate degenerative changes of the first carpometacarpal joint. There are also some early degenerative changes of the DIP joints, especially the second finger. No evidence for inflammatory arthritis. Soft tissues normal. 
 Also noted is some chondrocalcinosis and/or periarticular calcifications between the ulna and the adjacent carpal bones.  This raises the question of pseudogout.
IMPRESSION: 1.  There are changes of osteoarthritis as described above.
 2.  There are articular or periarticular calcifications raising the question of pseudogout.
 RIGHT HAND ? 3 VIEW:
FINDINGS: There are mild degenerative changes of the first MCP joint and some degenerative changes of the DIP joints, similar to the left hand. There are no soft tissue calcifications as were noted on the left.  There are no erosions to suggest synovitis or inflammatory arthritis.
IMPRESSION: Mild to moderate changes of osteoarthritis as described above.

## 2008-09-03 ENCOUNTER — Encounter: Admission: RE | Admit: 2008-09-03 | Discharge: 2008-09-03 | Payer: Self-pay | Admitting: Obstetrics and Gynecology

## 2008-11-24 ENCOUNTER — Ambulatory Visit (HOSPITAL_COMMUNITY): Admission: RE | Admit: 2008-11-24 | Discharge: 2008-11-24 | Payer: Self-pay | Admitting: Family Medicine

## 2009-02-17 ENCOUNTER — Ambulatory Visit (HOSPITAL_COMMUNITY): Admission: RE | Admit: 2009-02-17 | Discharge: 2009-02-17 | Payer: Self-pay | Admitting: Internal Medicine

## 2009-03-22 IMAGING — CT CT CHEST W/O CM
2 of 3 series · 16 of 36 positions shown, 20 images · non-contrast
Comparison: 12/10/2005.

CLINICAL DATA: Followup lung nodules

CHEST CT WITHOUT CONTRAST

[Series 2: chest routine 5.0 b40f · axial · 0.63mm/px · z∈[-300,-25]mm · 13 of 63 slices shown, 17 images]
[im 5/63  mediastinal]
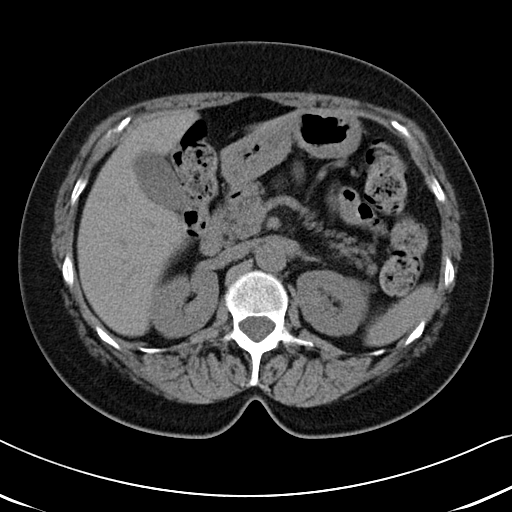
[im 5/63  lung]
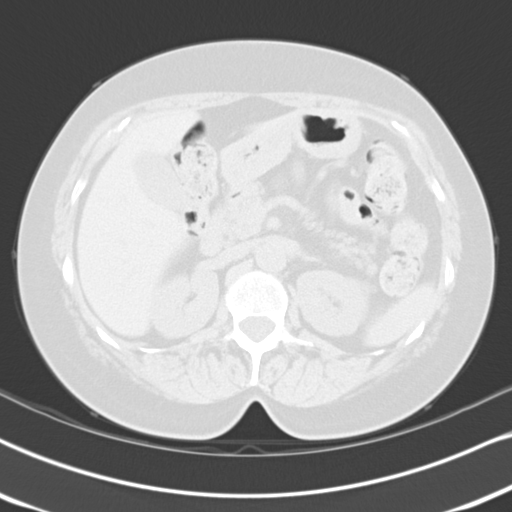
[im 10/63  lung]
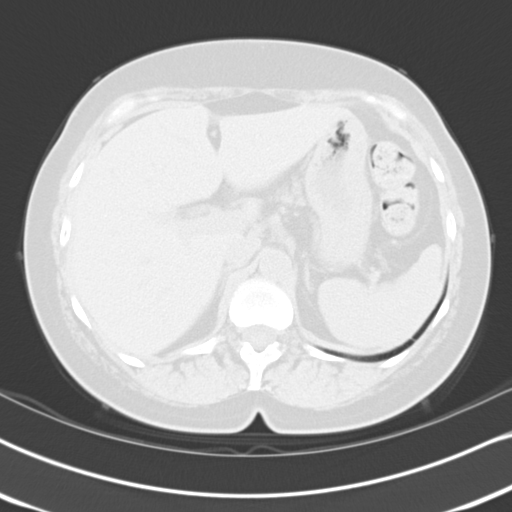
[im 14/63  lung]
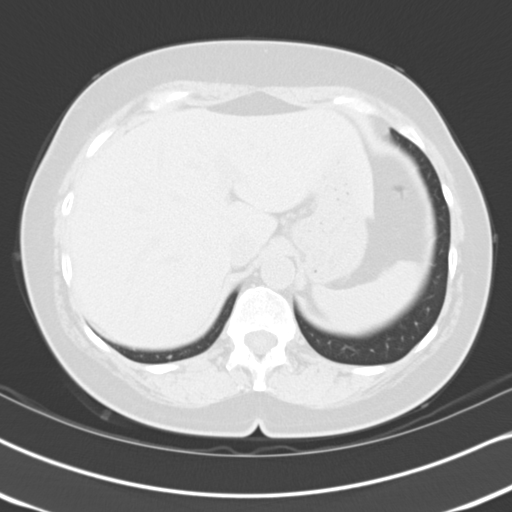
[im 19/63  lung]
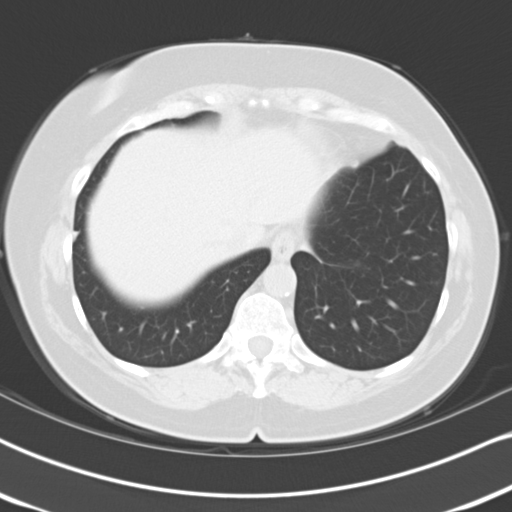
[im 23/63  mediastinal]
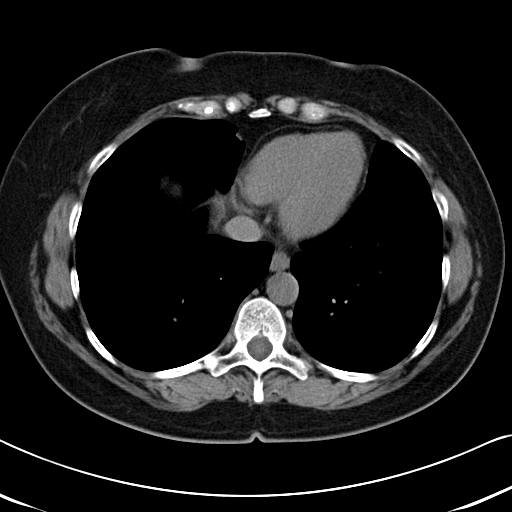
[im 23/63  lung]
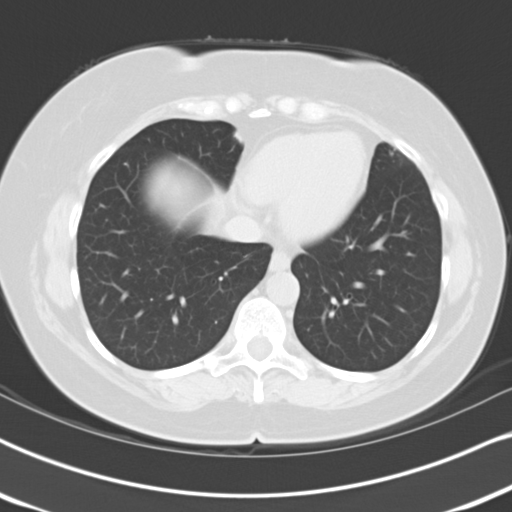
[im 28/63  lung]
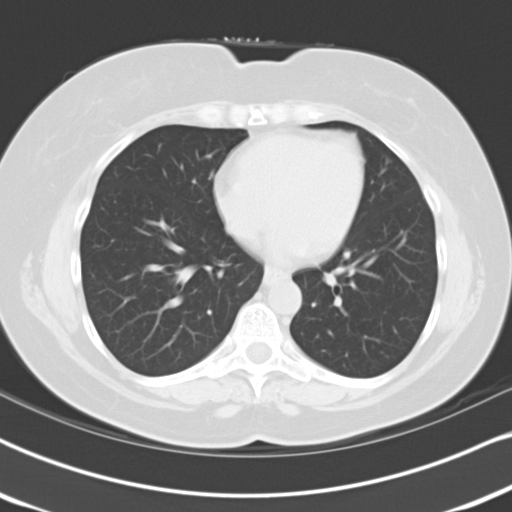
[im 33/63  lung]
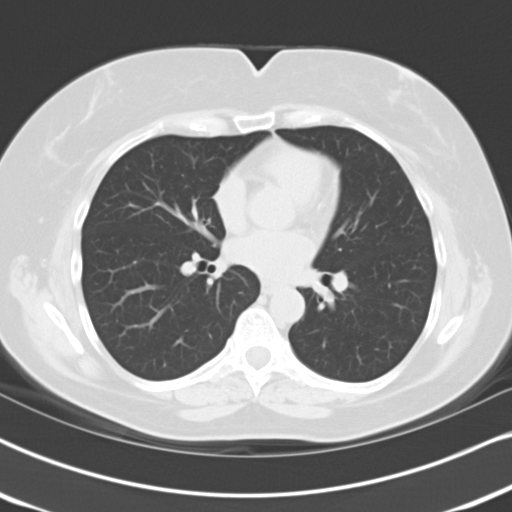
[im 37/63  lung]
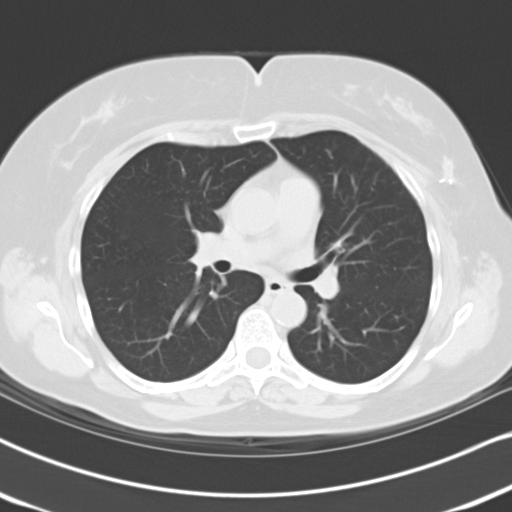
[im 42/63  mediastinal]
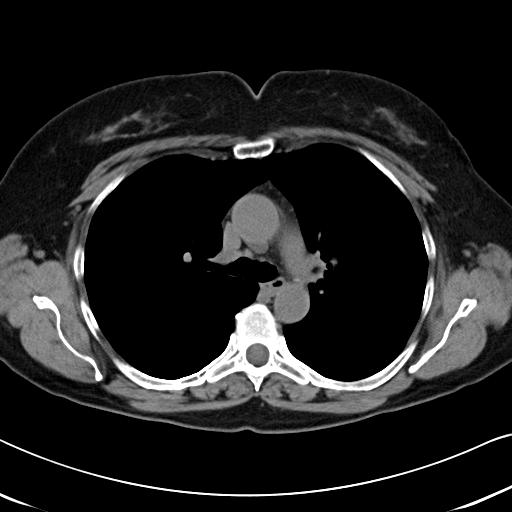
[im 42/63  lung]
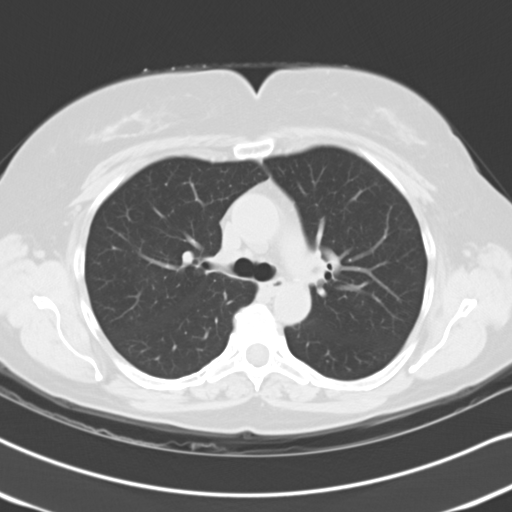
[im 46/63  lung]
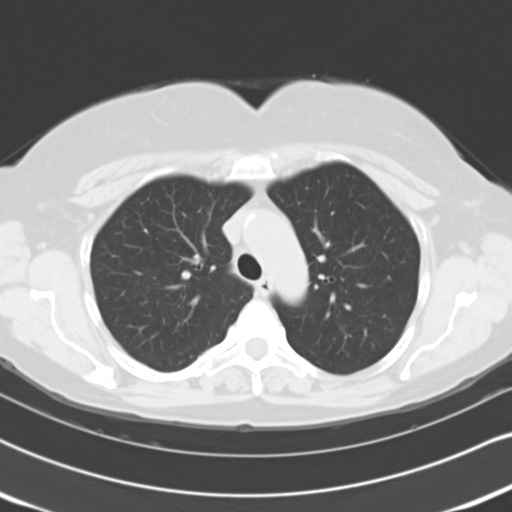
[im 51/63  lung]
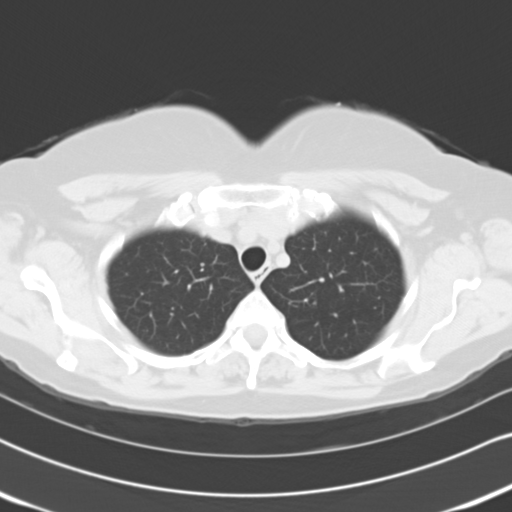
[im 56/63  lung]
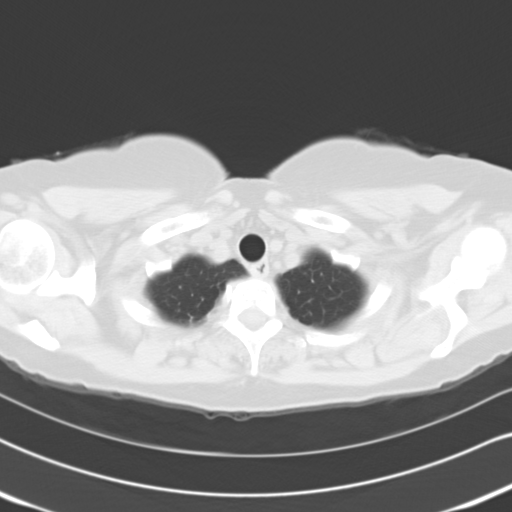
[im 60/63  mediastinal]
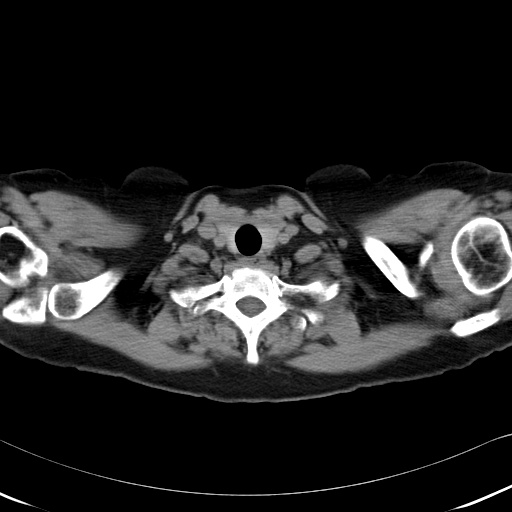
[im 60/63  lung]
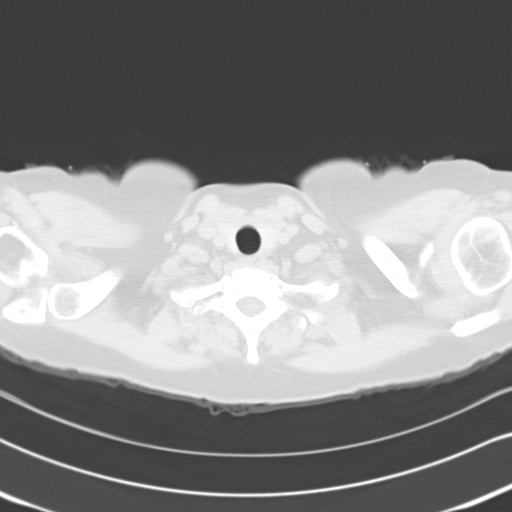

[Series 602: <mpr thick range> · coronal · 0.63mm/px · 3 of 66 slices shown]
[im 14/66  lung]
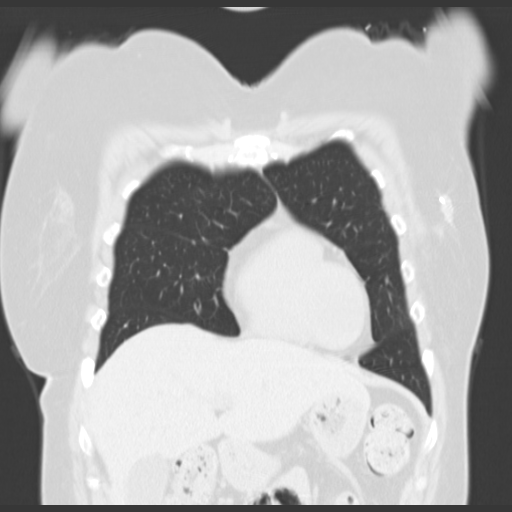
[im 27/66  lung]
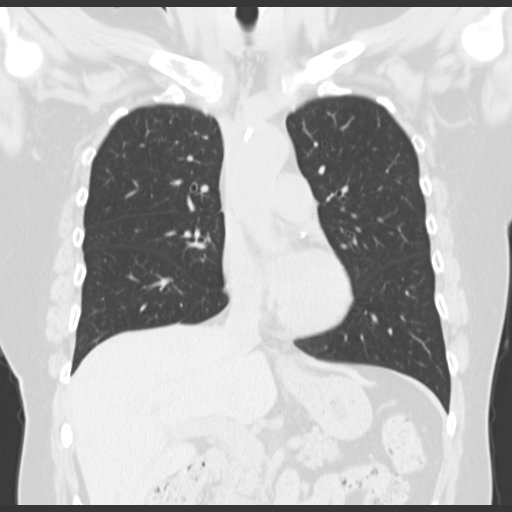
[im 40/66  lung]
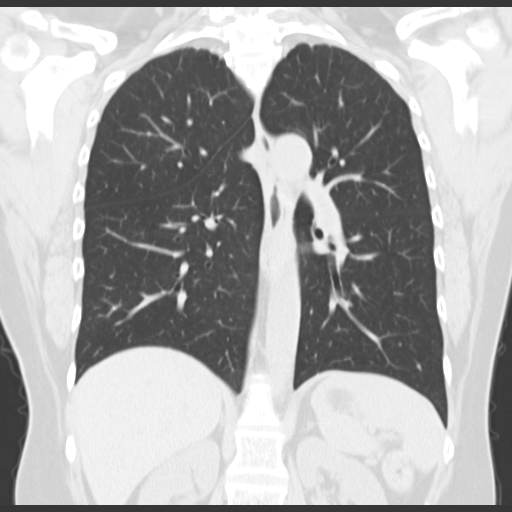

[16 of 36 positions shown; findings below may reference images not displayed]

FINDINGS: The patient is noted to be status post left axillary lymph node
dissection. 

No enlarged axillary, supraclavicular, mediastinal, or hilar adenopathy.

No pericardial or pleural effusion.

Again noted are several small pulmonary nodules bilaterally. Right lower lobe
pulmonary nodule measures 4 mm, image 38. Unchanged from prior exam. 7.1 mm left
lower lobe nodule, image 47. This is unchanged compared to prior exam. Several
other small tiny densities are noted in both lungs, similar to prior exam.

Gallstone is incidentally noted in the upper abdomen.

IMPRESSION

Stable small pulmonary nodules. Followup examination in 12 to 18 months is
recommended.

## 2009-08-19 LAB — HM MAMMOGRAPHY

## 2009-08-19 LAB — HM PAP SMEAR

## 2009-09-05 ENCOUNTER — Encounter: Admission: RE | Admit: 2009-09-05 | Discharge: 2009-09-05 | Payer: Self-pay | Admitting: Obstetrics and Gynecology

## 2009-10-15 IMAGING — CT CT ABDOMEN W/ CM
2 of 6 series · 16 of 46 positions shown, 18 images · IV contrast (omnipaque)
Comparison: None.

CLINICAL DATA: Nausea and weight loss. 
 ABDOMEN CT WITH CONTRAST:
TECHNIQUE: Multidetector CT imaging of the abdomen was performed following the standard protocol during bolus administration of intravenous contrast.
 Contrast:  Oral contrast and 100 cc Omnipaque 300 IV.

[Series 2: abd 5.0 b40s · axial · 0.62mm/px · z∈[-246,-66]mm · 13 of 44 slices shown, 15 images]
[im 4/44  soft-tissue]
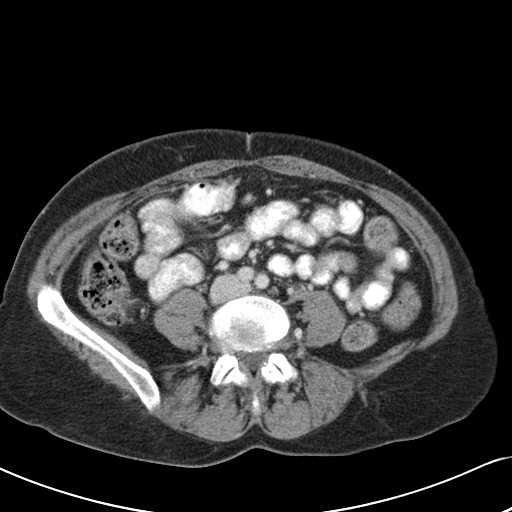
[im 4/44  bone]
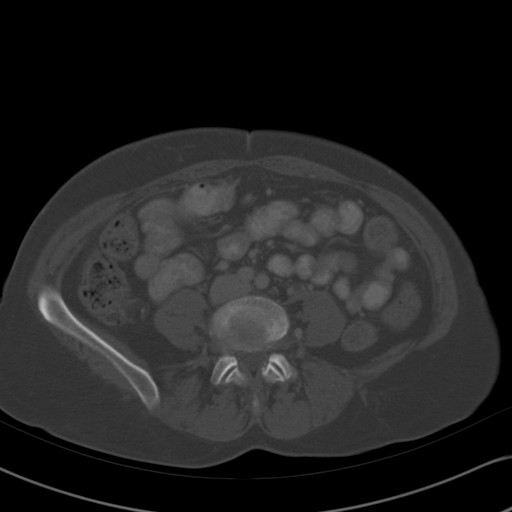
[im 7/44  soft-tissue]
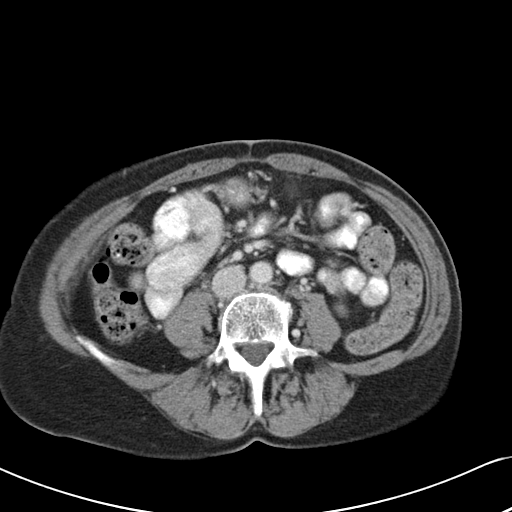
[im 10/44  soft-tissue]
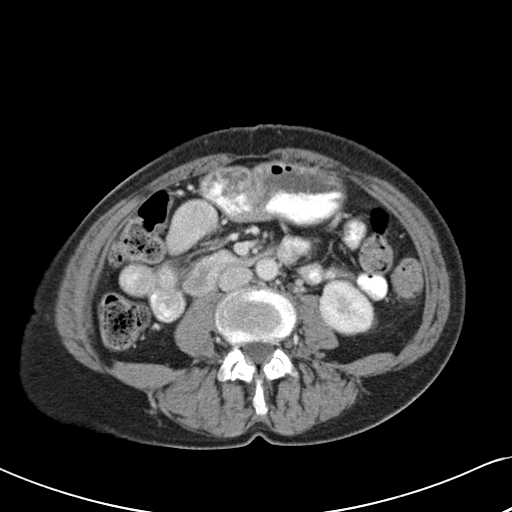
[im 13/44  soft-tissue]
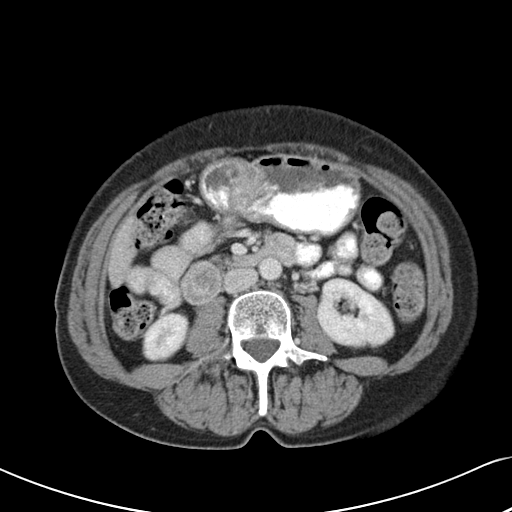
[im 16/44  soft-tissue]
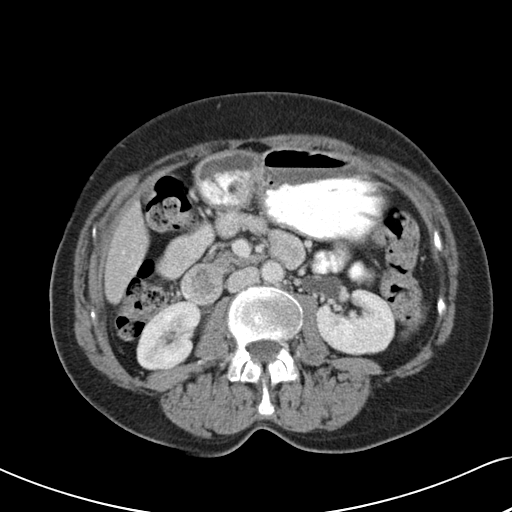
[im 19/44  soft-tissue]
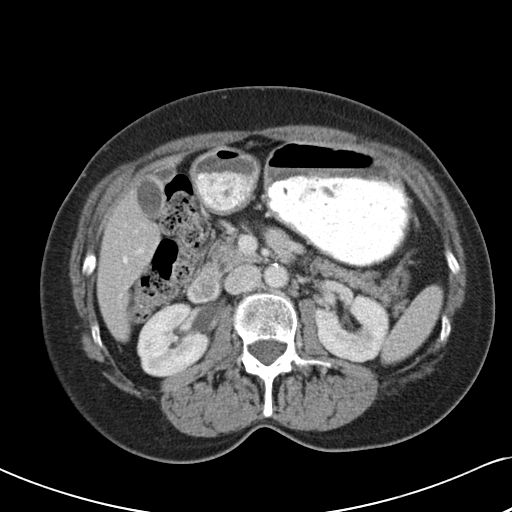
[im 22/44  soft-tissue]
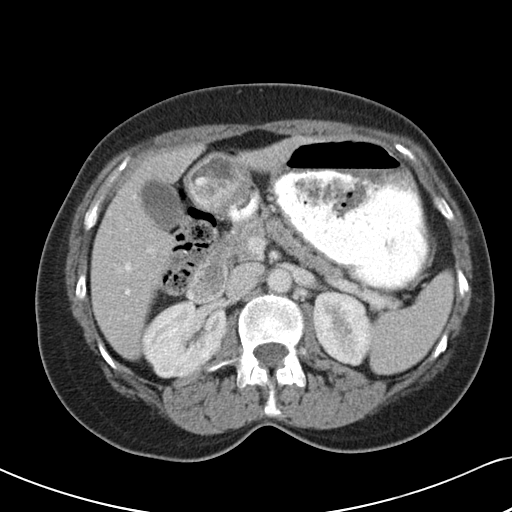
[im 25/44  soft-tissue]
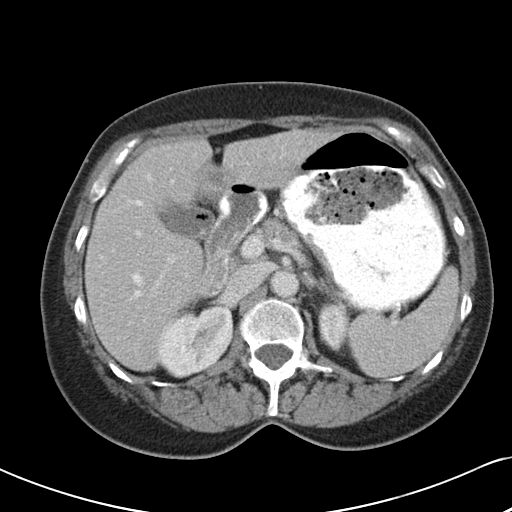
[im 28/44  soft-tissue]
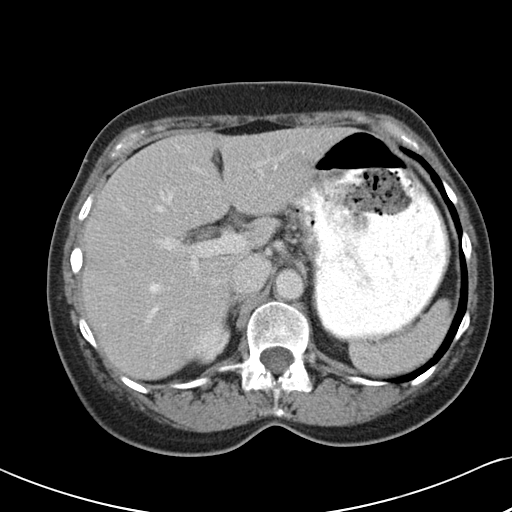
[im 28/44  bone]
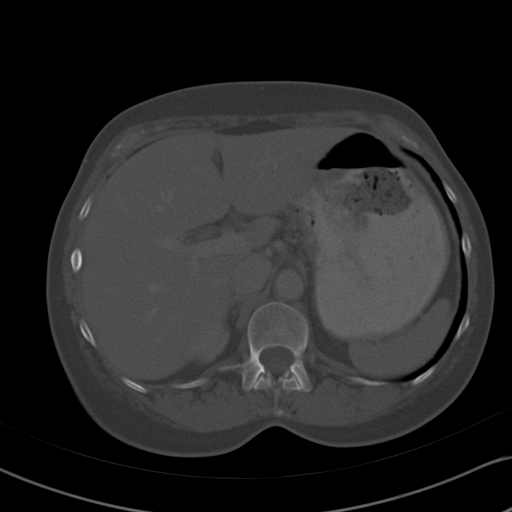
[im 31/44  soft-tissue]
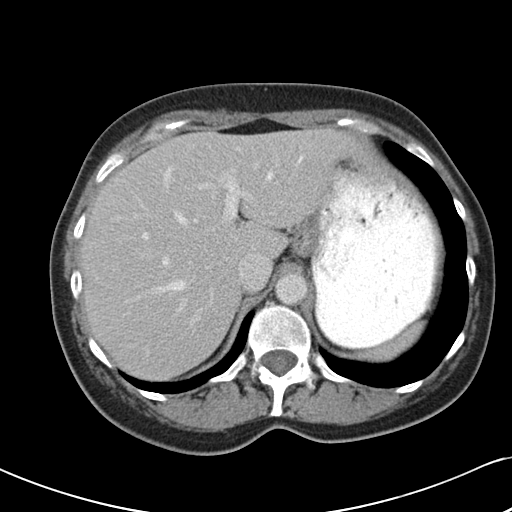
[im 34/44  soft-tissue]
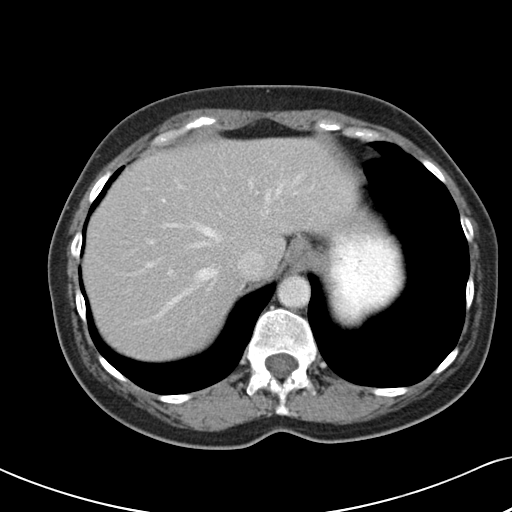
[im 37/44  soft-tissue]
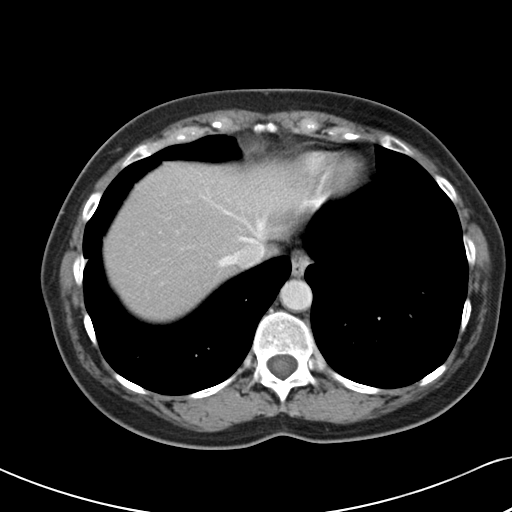
[im 40/44  soft-tissue]
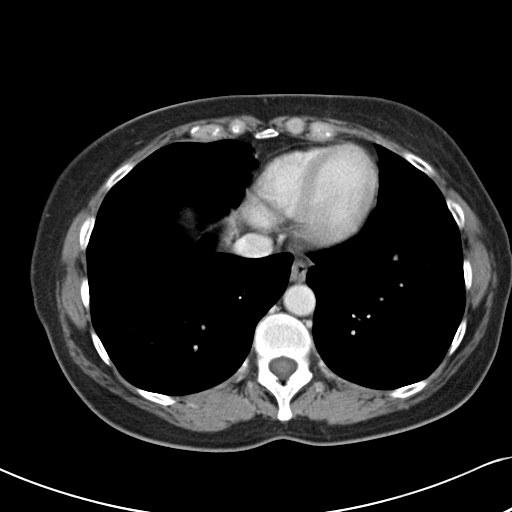

[Series 602: <mpr thick range> · coronal · 0.62mm/px · 3 of 74 slices shown]
[im 19/74  soft-tissue]
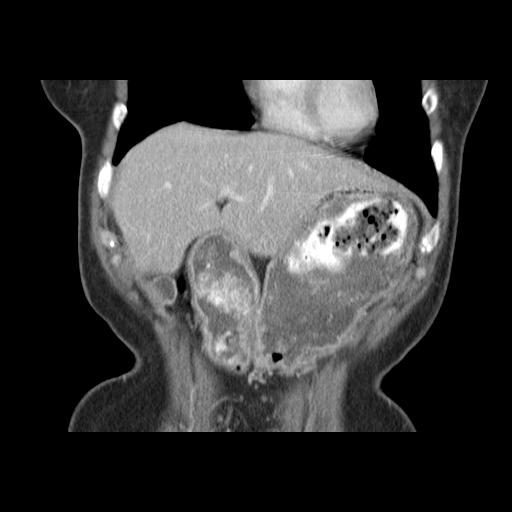
[im 37/74  soft-tissue]
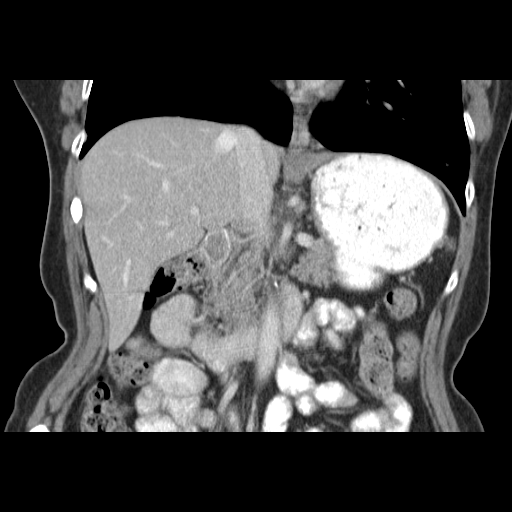
[im 55/74  soft-tissue]
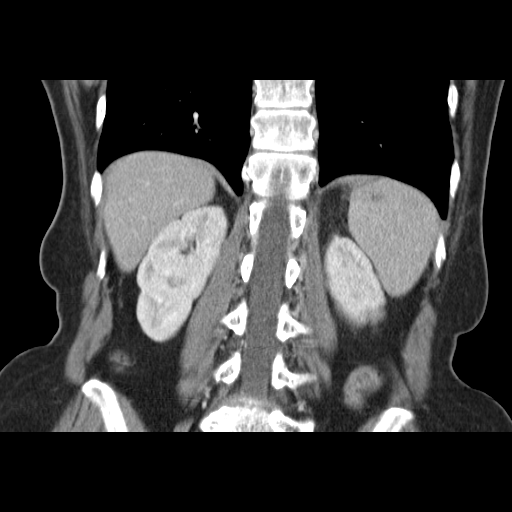

[16 of 46 positions shown; findings below may reference images not displayed]

FINDINGS: Images of the lung bases are unremarkable.  The enhanced liver, spleen, pancreas and adrenal glands are unremarkable.  The kidneys show symmetrical size and enhancement without evidence of focal mass.  Mild bilateral prominent extrarenal collecting system is noted without evidence of hydronephrosis or hydroureter.  Moderate amount of debris and contrast within stomach without evidence of gastric outlet obstruction. The gallbladder shows small amount of air and two foci measures about 6 mm. I cannot exclude air trapped within noncalcified gallstones.  Correlation with gallbladder ultrasound is recommended. No gallbladder wall thickening or pericholecystic fluid is noted.
 There is no bowel obstruction.   There is no ascites or free air.  Stool noted throughout the colon.   There is no adenopathy.  
 No destructive bony lesions are noted.
IMPRESSION: 1.  There is no bowel obstruction.   There is no ascites or free air.  There is no adenopathy.  
 2.  Tiny amount of air noted and two foci within gallbladder. I cannot exclude air trapped within noncalcified gallstones.  Correlation with gallbladder ultrasound is recommended. 
 3.  There is no thickening of gallbladder wall or pericholecystic fluid.  
 4.  No destructive bony lesions are seen.

## 2009-10-25 ENCOUNTER — Encounter: Payer: Self-pay | Admitting: Internal Medicine

## 2009-10-26 IMAGING — US US ABDOMEN COMPLETE
1 series · 14 of 25 positions shown · non-contrast
Comparison: CT 03/31/07

CLINICAL DATA: Nausea.  Evaluate gallstones.
 ABDOMEN ULTRASOUND:
TECHNIQUE: Complete abdominal ultrasound examination was performed including evaluation of the liver, gallbladder, bile ducts, pancreas, kidneys, spleen, IVC, and abdominal aorta.

[Series 1: unknown · 0.25mm/px · 14 of 61 slices shown]
[im 1/61]
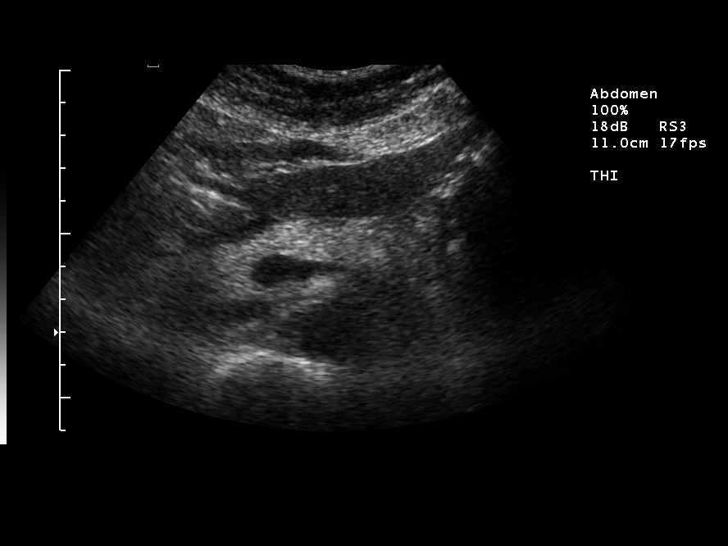
[im 6/61]
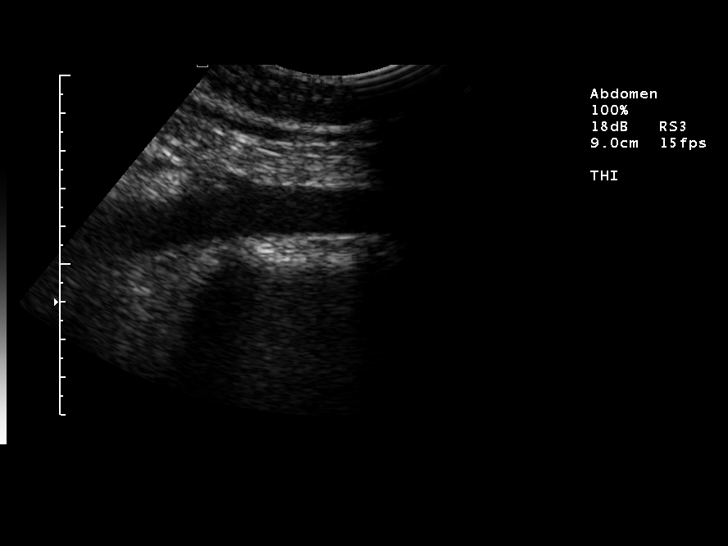
[im 11/61]
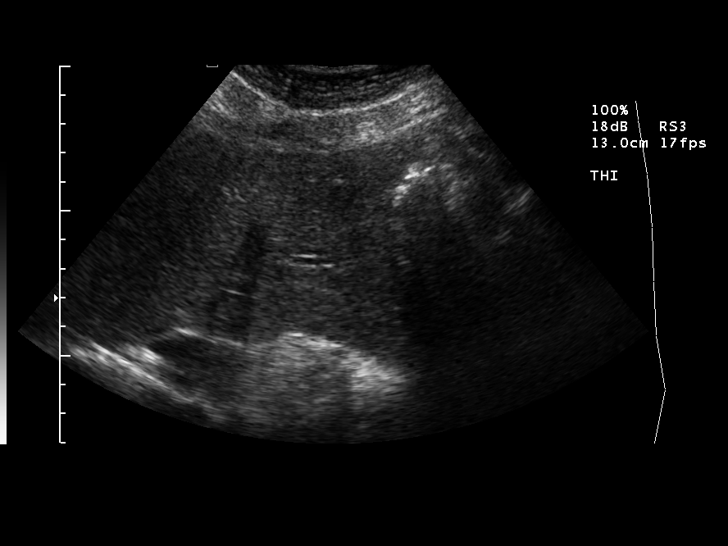
[im 16/61]
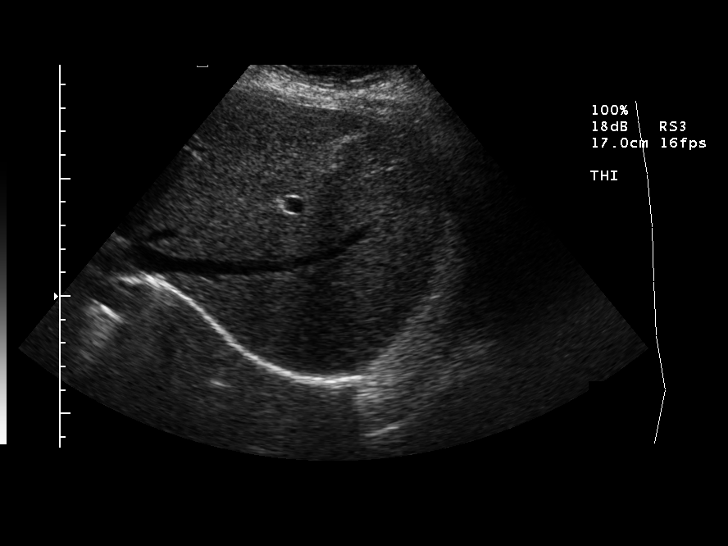
[im 21/61]
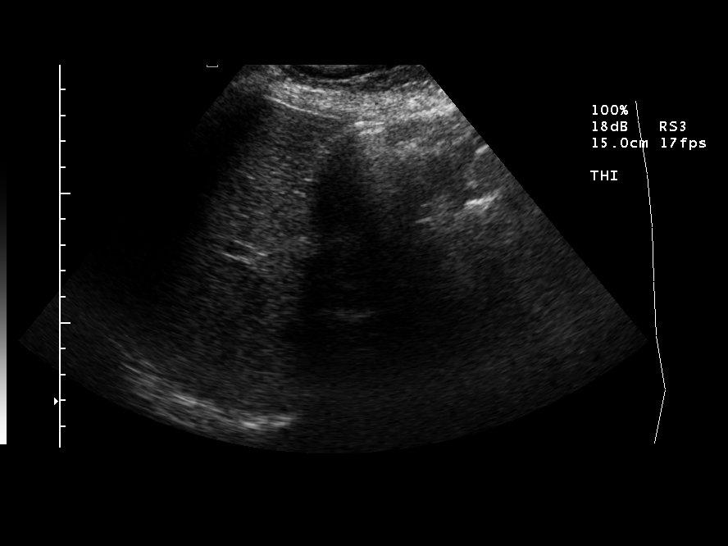
[im 23/61]
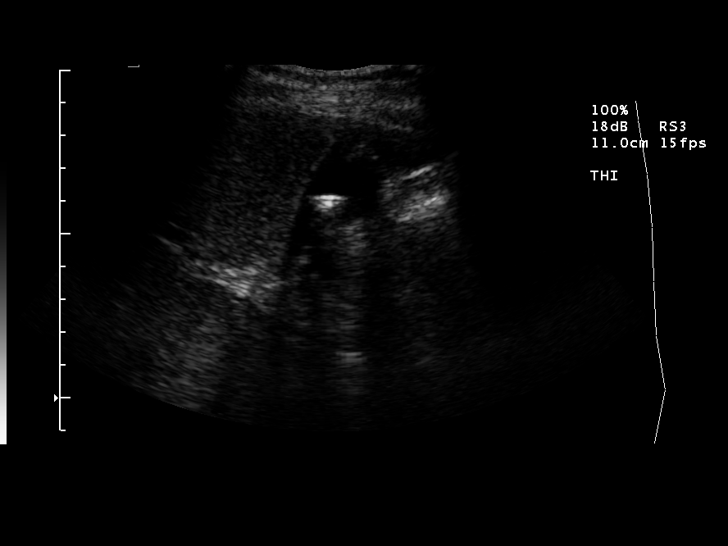
[im 28/61]
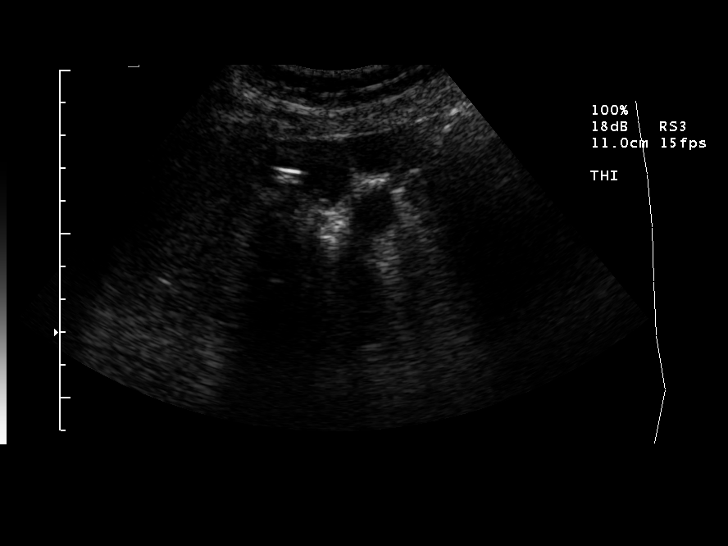
[im 33/61]
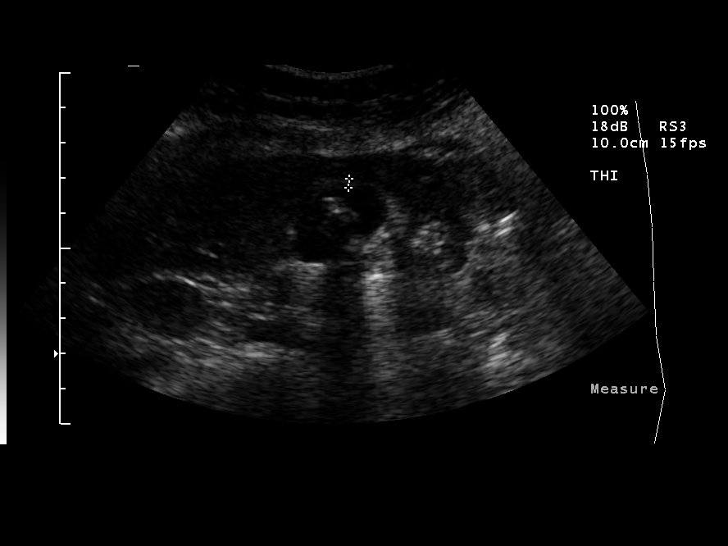
[im 38/61]
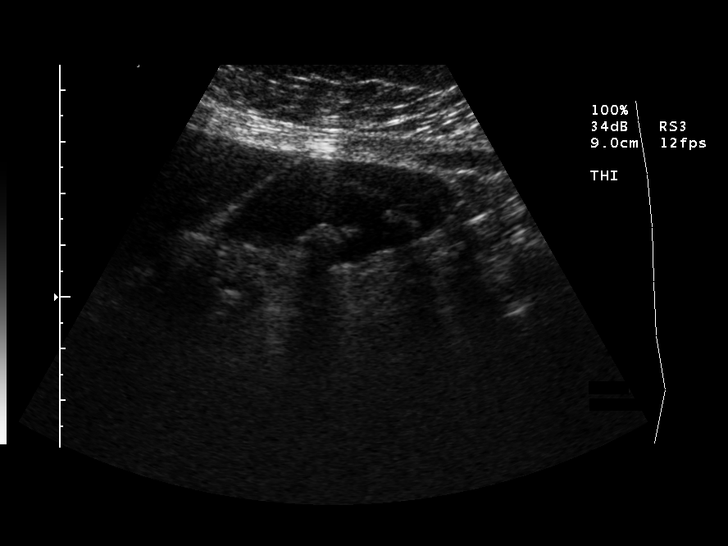
[im 41/61]
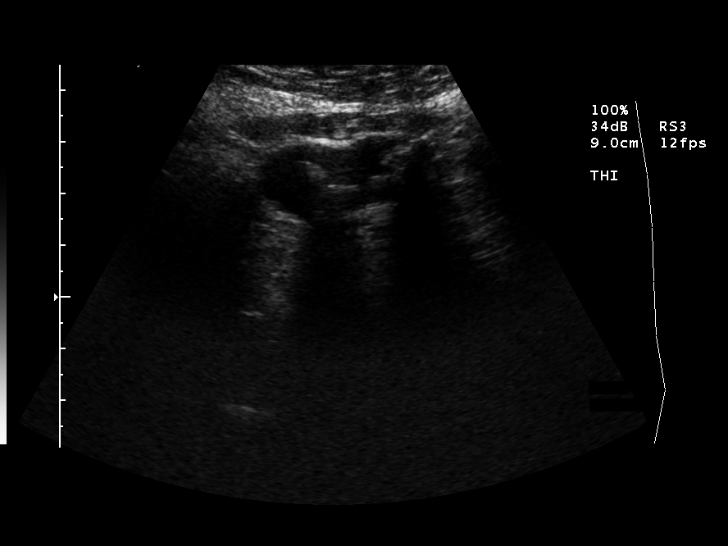
[im 46/61]
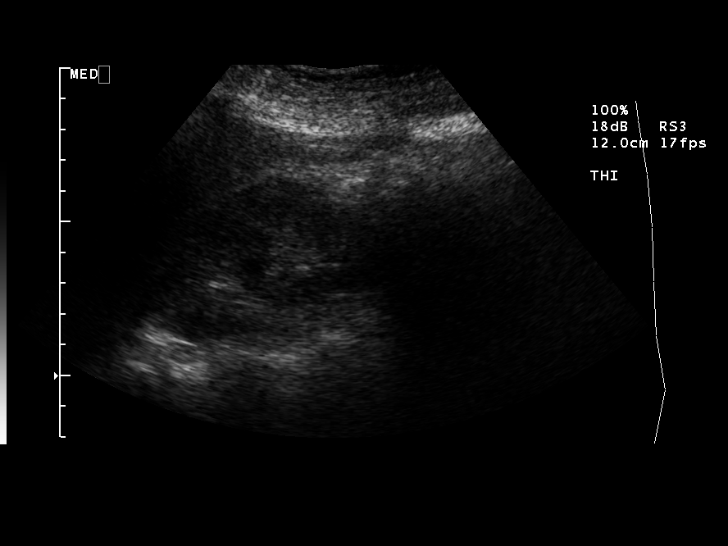
[im 51/61]
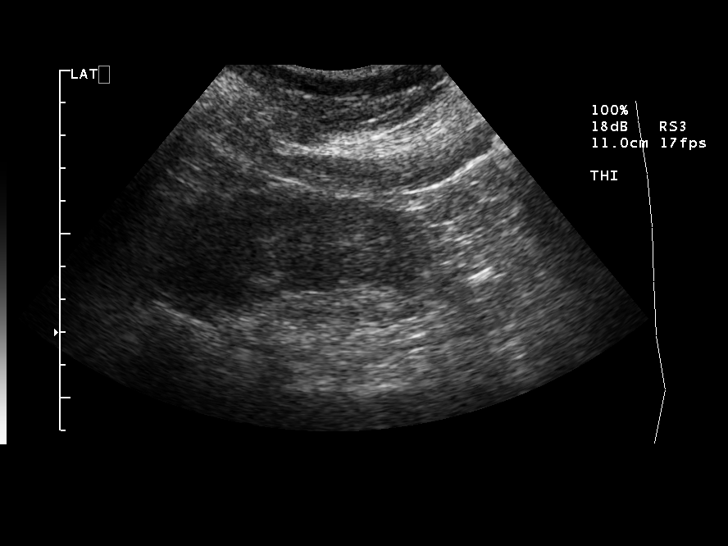
[im 56/61]
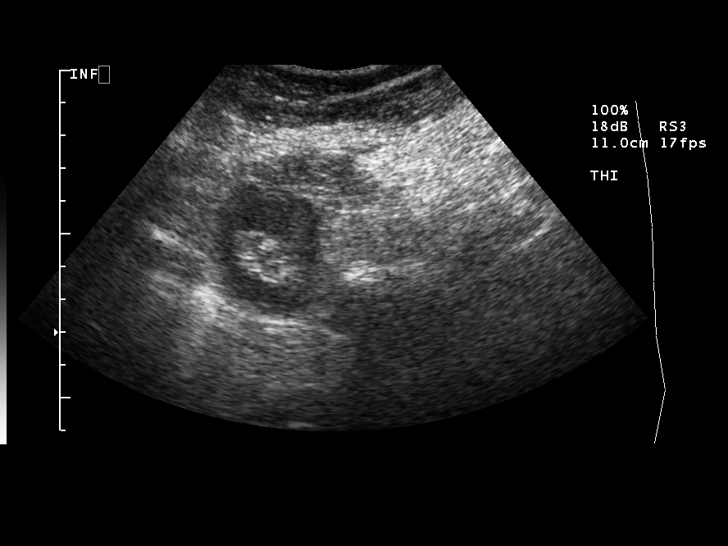
[im 61/61]
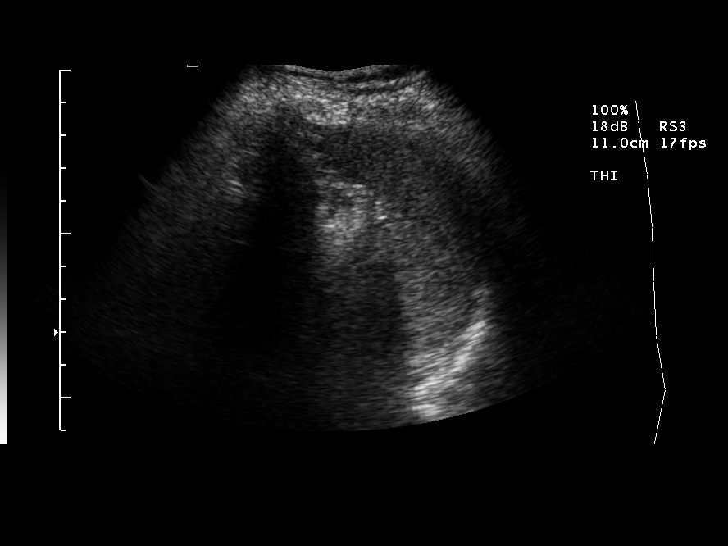

[14 of 25 positions shown; findings below may reference images not displayed]

FINDINGS: Multiple shadowing stones are identified within the gallbladder lumen.  
 No gallbladder wall thickening.  The gallbladder wall measures 2.6 mm.  No pericholecystic fluid.  
 The sonographer reports tenderness over the gallbladder area.  
 Common bile duct normal in caliber measuring 4.2 mm. 
 Liver negative.
 IVC patent.
 Pancreas is unremarkable.
 Spleen normal size measuring 7.7 cm.
 The right and left kidneys are unremarkable. 
 The abdominal aorta is nondilated.
IMPRESSION: 1.  Gallstones.  
 2.  Positive sonographic Murphy?s sign.

## 2009-10-27 ENCOUNTER — Encounter: Payer: Self-pay | Admitting: Cardiology

## 2009-10-27 LAB — CONVERTED CEMR LAB
ALT: 26 units/L
AST: 25 units/L
Albumin: 4.4 g/dL
Alkaline Phosphatase: 42 units/L
Total Bilirubin: 0.5 mg/dL

## 2009-11-01 ENCOUNTER — Encounter: Payer: Self-pay | Admitting: Internal Medicine

## 2009-11-01 LAB — CONVERTED CEMR LAB
Hemoglobin: 12.4 g/dL
MCV: 9 fL
Platelets: 203 10*3/uL
WBC: 9.2 10*3/uL

## 2009-11-10 ENCOUNTER — Encounter (INDEPENDENT_AMBULATORY_CARE_PROVIDER_SITE_OTHER): Payer: Self-pay | Admitting: *Deleted

## 2009-11-24 ENCOUNTER — Ambulatory Visit: Payer: Self-pay | Admitting: Cardiology

## 2009-11-24 ENCOUNTER — Telehealth: Payer: Self-pay | Admitting: Cardiology

## 2009-11-24 ENCOUNTER — Encounter: Payer: Self-pay | Admitting: Cardiology

## 2009-12-07 ENCOUNTER — Encounter: Payer: Self-pay | Admitting: Internal Medicine

## 2009-12-12 ENCOUNTER — Encounter: Admission: RE | Admit: 2009-12-12 | Discharge: 2009-12-12 | Payer: Self-pay | Admitting: Family Medicine

## 2009-12-14 ENCOUNTER — Telehealth (INDEPENDENT_AMBULATORY_CARE_PROVIDER_SITE_OTHER): Payer: Self-pay

## 2009-12-15 ENCOUNTER — Ambulatory Visit: Payer: Self-pay

## 2009-12-15 ENCOUNTER — Ambulatory Visit (HOSPITAL_COMMUNITY): Admission: RE | Admit: 2009-12-15 | Discharge: 2009-12-15 | Payer: Self-pay | Admitting: Cardiology

## 2009-12-15 ENCOUNTER — Encounter: Payer: Self-pay | Admitting: Cardiology

## 2009-12-15 ENCOUNTER — Ambulatory Visit: Payer: Self-pay | Admitting: Cardiology

## 2009-12-23 ENCOUNTER — Telehealth: Payer: Self-pay | Admitting: Critical Care Medicine

## 2009-12-23 ENCOUNTER — Ambulatory Visit: Payer: Self-pay | Admitting: Critical Care Medicine

## 2009-12-23 DIAGNOSIS — E785 Hyperlipidemia, unspecified: Secondary | ICD-10-CM | POA: Insufficient documentation

## 2009-12-26 ENCOUNTER — Telehealth (INDEPENDENT_AMBULATORY_CARE_PROVIDER_SITE_OTHER): Payer: Self-pay | Admitting: *Deleted

## 2009-12-26 IMAGING — RF DG CHOLANGIOGRAM OPERATIVE
1 series · 4 of 4 positions shown · non-contrast
Comparison: None

CLINICAL DATA: Gallstones.

INTRAOPERATIVE CHOLANGIOGRAM
TECHNIQUE: Multiple fluoroscopic spot radiographs were obtained
during intraoperative cholangiogram and are submitted for
interpretation post-operatively.

[Series 1: run · 4 of 106 frames shown]
[frame 16/106]
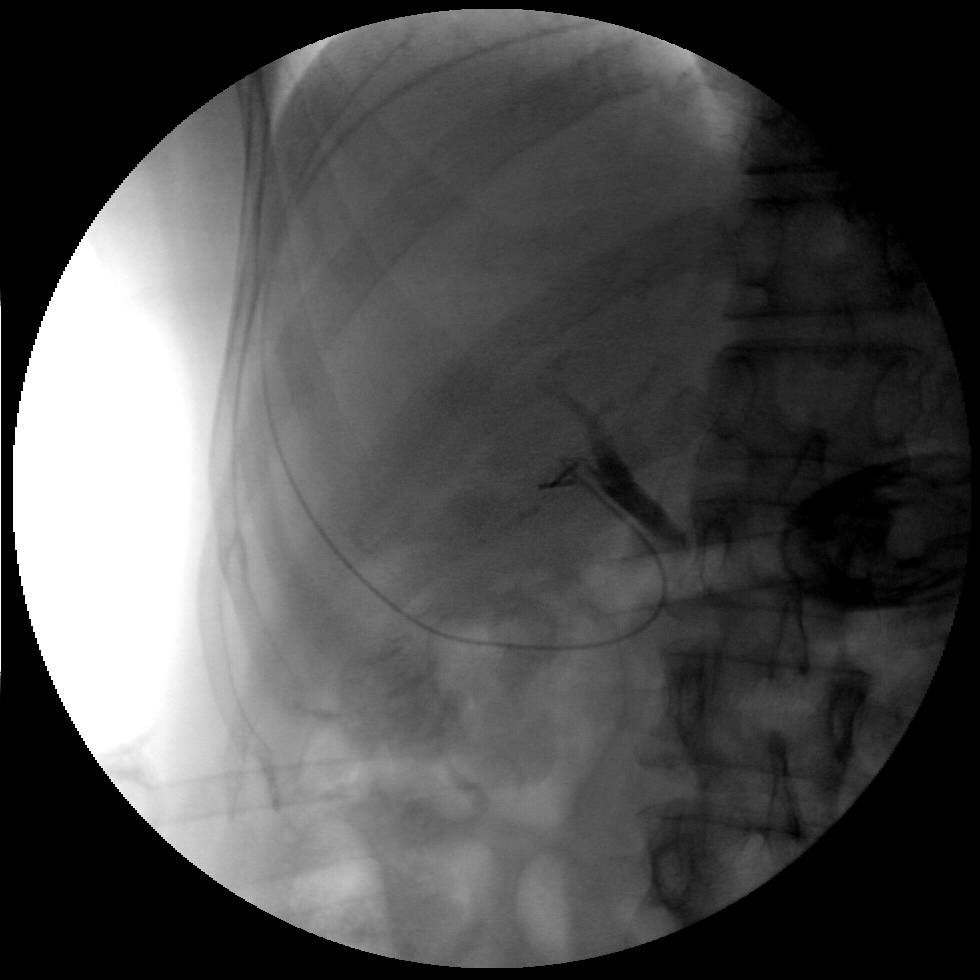
[frame 54/106]
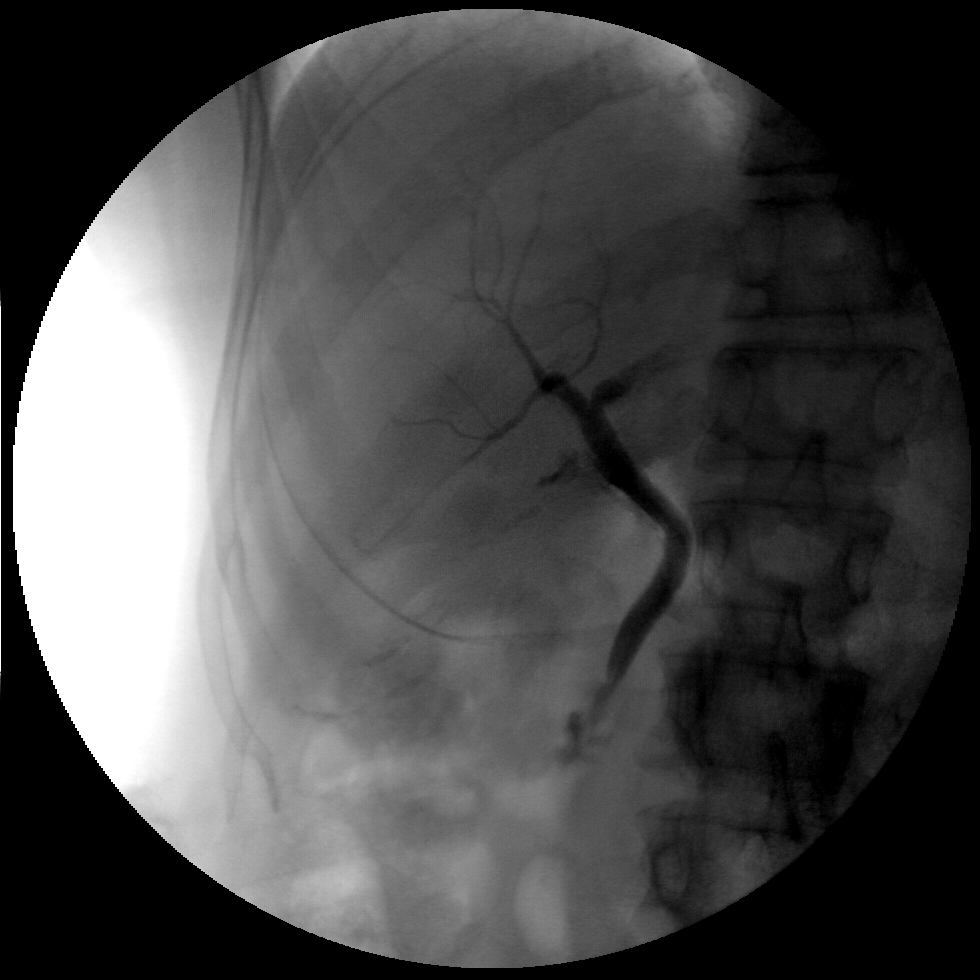
[frame 58/106]
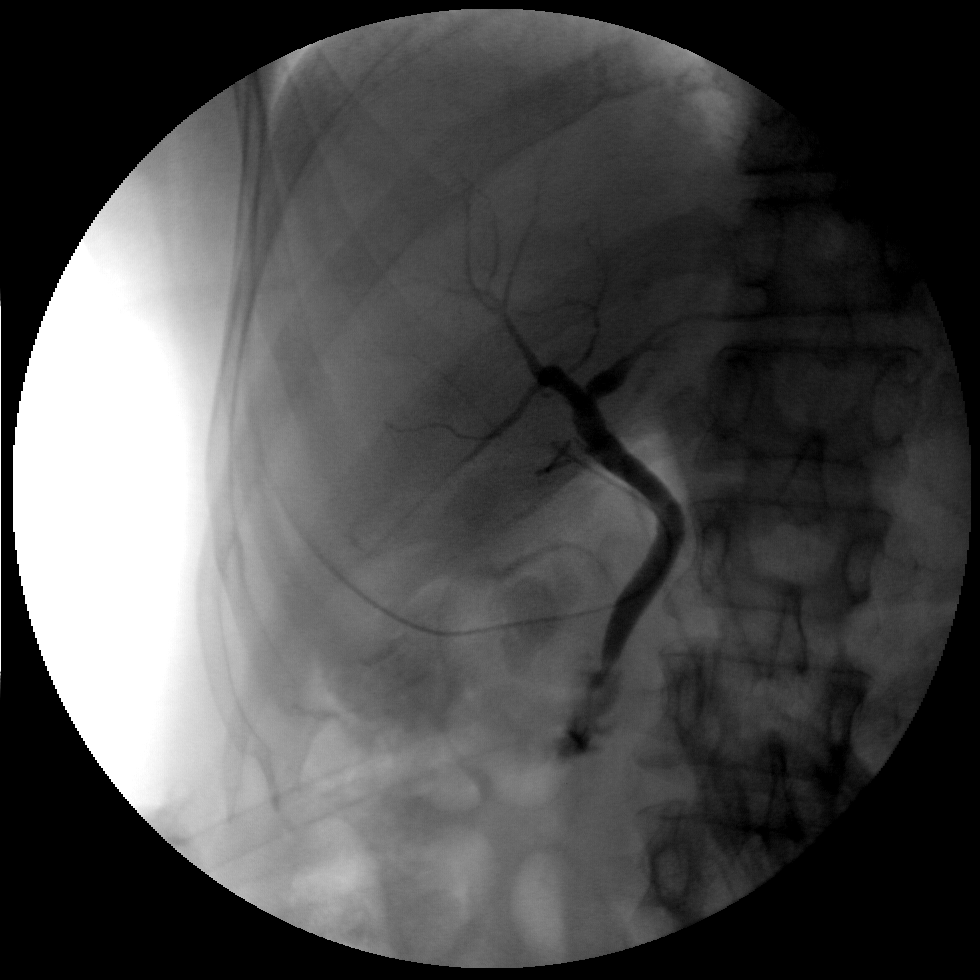
[frame 91/106]
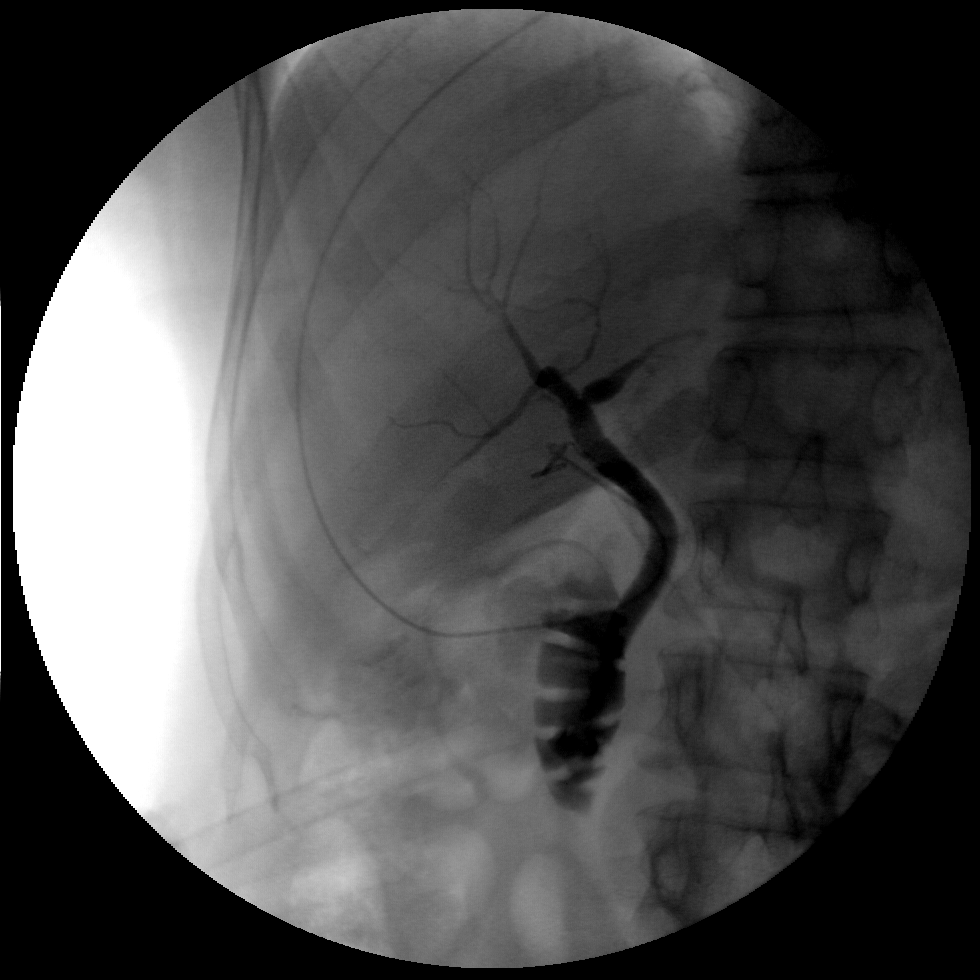

[4 of 4 positions shown; findings below may reference images not displayed]

FINDINGS: Intraoperative cholangiogram shows opacification of the
biliary tree.  No filling defects.
IMPRESSION: Please see above.

## 2009-12-27 ENCOUNTER — Encounter: Payer: Self-pay | Admitting: Cardiology

## 2009-12-27 ENCOUNTER — Ambulatory Visit: Payer: Self-pay | Admitting: Cardiology

## 2009-12-27 ENCOUNTER — Ambulatory Visit: Payer: Self-pay | Admitting: Internal Medicine

## 2009-12-27 ENCOUNTER — Encounter (HOSPITAL_COMMUNITY)
Admission: RE | Admit: 2009-12-27 | Discharge: 2010-02-18 | Payer: Self-pay | Source: Home / Self Care | Attending: Cardiology | Admitting: Cardiology

## 2009-12-27 ENCOUNTER — Ambulatory Visit: Payer: Self-pay

## 2009-12-29 LAB — CONVERTED CEMR LAB: IgE (Immunoglobulin E), Serum: 102.8 intl units/mL (ref 0.0–180.0)

## 2010-01-02 ENCOUNTER — Ambulatory Visit: Payer: Self-pay | Admitting: Critical Care Medicine

## 2010-01-02 ENCOUNTER — Encounter: Payer: Self-pay | Admitting: Adult Health

## 2010-01-02 ENCOUNTER — Ambulatory Visit: Payer: Self-pay | Admitting: Internal Medicine

## 2010-01-02 DIAGNOSIS — K589 Irritable bowel syndrome without diarrhea: Secondary | ICD-10-CM | POA: Insufficient documentation

## 2010-01-04 ENCOUNTER — Telehealth: Payer: Self-pay | Admitting: Internal Medicine

## 2010-01-23 ENCOUNTER — Ambulatory Visit: Payer: Self-pay | Admitting: Critical Care Medicine

## 2010-01-23 ENCOUNTER — Encounter: Payer: Self-pay | Admitting: Critical Care Medicine

## 2010-01-23 DIAGNOSIS — J45901 Unspecified asthma with (acute) exacerbation: Secondary | ICD-10-CM | POA: Insufficient documentation

## 2010-01-30 ENCOUNTER — Telehealth (INDEPENDENT_AMBULATORY_CARE_PROVIDER_SITE_OTHER): Payer: Self-pay | Admitting: *Deleted

## 2010-02-07 ENCOUNTER — Telehealth (INDEPENDENT_AMBULATORY_CARE_PROVIDER_SITE_OTHER): Payer: Self-pay | Admitting: *Deleted

## 2010-02-17 ENCOUNTER — Ambulatory Visit (HOSPITAL_COMMUNITY)
Admission: RE | Admit: 2010-02-17 | Discharge: 2010-02-17 | Payer: Self-pay | Source: Home / Self Care | Attending: Family Medicine | Admitting: Family Medicine

## 2010-02-19 HISTORY — PX: CATARACT EXTRACTION W/ INTRAOCULAR LENS  IMPLANT, BILATERAL: SHX1307

## 2010-02-21 ENCOUNTER — Telehealth (INDEPENDENT_AMBULATORY_CARE_PROVIDER_SITE_OTHER): Payer: Self-pay | Admitting: *Deleted

## 2010-02-21 ENCOUNTER — Encounter: Payer: Self-pay | Admitting: Internal Medicine

## 2010-02-22 ENCOUNTER — Ambulatory Visit
Admission: RE | Admit: 2010-02-22 | Discharge: 2010-02-22 | Payer: Self-pay | Source: Home / Self Care | Attending: Internal Medicine | Admitting: Internal Medicine

## 2010-02-27 ENCOUNTER — Ambulatory Visit
Admission: RE | Admit: 2010-02-27 | Discharge: 2010-02-27 | Payer: Self-pay | Source: Home / Self Care | Attending: Critical Care Medicine | Admitting: Critical Care Medicine

## 2010-02-27 ENCOUNTER — Telehealth (INDEPENDENT_AMBULATORY_CARE_PROVIDER_SITE_OTHER): Payer: Self-pay | Admitting: *Deleted

## 2010-03-07 ENCOUNTER — Telehealth (INDEPENDENT_AMBULATORY_CARE_PROVIDER_SITE_OTHER): Payer: Self-pay | Admitting: *Deleted

## 2010-03-09 ENCOUNTER — Ambulatory Visit
Admission: RE | Admit: 2010-03-09 | Discharge: 2010-03-09 | Payer: Self-pay | Source: Home / Self Care | Attending: Critical Care Medicine | Admitting: Critical Care Medicine

## 2010-03-10 ENCOUNTER — Encounter: Payer: Self-pay | Admitting: Critical Care Medicine

## 2010-03-11 ENCOUNTER — Encounter: Payer: Self-pay | Admitting: Neurosurgery

## 2010-03-13 ENCOUNTER — Encounter: Payer: Self-pay | Admitting: Critical Care Medicine

## 2010-03-21 NOTE — Letter (Signed)
Summary: [facility]   Imported By: Lester Runnells 01/09/2010 11:22:12  _____________________________________________________________________  External Attachment:    Type:   Image     Comment:   External Document

## 2010-03-21 NOTE — Progress Notes (Signed)
Summary: Stress Echo Pre-Procedure  Phone Note Outgoing Call Call back at Home Phone [phone]   Call placed by: Irean Hong, RN,  December 14, 2009 2:00 PM Summary of Call: Left message with information on Stress Echo for tomorrow. Patsy Edwards,RN.

## 2010-03-21 NOTE — Letter (Signed)
Summary: Phone call/[facility]  Phone call/[facility]   Imported By: Lester Storrs 01/09/2010 11:27:55  _____________________________________________________________________  External Attachment:    Type:   Image     Comment:   External Document

## 2010-03-21 NOTE — Assessment & Plan Note (Signed)
Summary: Cardiology Nuclear Testing  Nuclear Med Background Indications for Stress Test: Evaluation for Ischemia   History: Echo  History Comments: 12/15/09 Stress Echo:nondiagnostic, EF=75%  Symptoms: Chest Pressure, Dizziness, DOE, Fatigue, Light-Headedness, Palpitations, SOB    Nuclear Pre-Procedure Caffeine/Decaff Intake: NONE NPO After: 7:00 AM Lungs: clear IV 0.9% NS with Angio Cath: 22g     IV Site: R Wrist IV Started by: Cathlyn Parsons, RN Chest Size (in) 34     Cup Size B     Height (in): 61 Weight (lb): 110 BMI: 20.86  Nuclear Med Study 1 or 2 day study:  1 day     Stress Test Type:  Treadmill/Lexiscan Reading MD:  Willa Rough, MD     Referring MD:  D.McLean Resting Radionuclide:  Technetium 33m Tetrofosmin     Resting Radionuclide Dose:  10.9 mCi  Stress Radionuclide:  Technetium 16m Tetrofosmin     Stress Radionuclide Dose:  33 mCi   Stress Protocol   Lexiscan: 0.4 mg   Stress Test Technologist:  Milana Na, EMT-P     Nuclear Technologist:  Doyne Keel, CNMT  Rest Procedure  Myocardial perfusion imaging was performed at rest 45 minutes following the intravenous administration of Technetium 54m Tetrofosmin.  Stress Procedure  The patient received IV Lexiscan 0.4 mg over 15-seconds with concurrent low level exercise and then Technetium 46m Tetrofosmin was injected at 30-seconds while the patient continued walking one more minute.  There were no significant changes with Lexiscan.  Quantitative spect images were obtained after a 45 minute delay.  QPS Raw Data Images:  Normal; no motion artifact; normal heart/lung ratio. Stress Images:  Normal homogeneous uptake in all areas of the myocardium. Rest Images:  Normal homogeneous uptake in all areas of the myocardium. Subtraction (SDS):  No evidence of ischemia. Transient Ischemic Dilatation:  1.15  (Normal <1.22)  Lung/Heart Ratio:  .27  (Normal <0.45)  Quantitative Gated Spect Images QGS EDV:  52  ml QGS ESV:  13 ml QGS EF:  75 % QGS cine images:  Normal motion  Findings Normal nuclear study      Overall Impression  Exercise Capacity: Lexiscan with no exercise. BP Response: Normal blood pressure response. Clinical Symptoms: SOB ECG Impression: No significant ST segment change suggestive of ischemia. Overall Impression: Normal stress nuclear study.  Appended Document: Cardiology Nuclear Testing normal study  Appended Document: Cardiology Nuclear Testing [facility]  Appended Document: Cardiology Nuclear Testing voice mail  Appended Document: Cardiology Nuclear Testing [facility]  Appended Document: Cardiology Nuclear Testing [facility]   Appended Document: Cardiology Nuclear Testing pt given results

## 2010-03-21 NOTE — Letter (Signed)
Summary: Ensure letter/[facility]  Ensure letter/[facility]   Imported By: Lester Liberty 01/09/2010 11:31:52  _____________________________________________________________________  External Attachment:    Type:   Image     Comment:   External Document
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
## 2010-03-21 NOTE — Letter (Signed)
Summary: [name]  [facility]   Imported By: Lester Farragut 01/09/2010 11:03:17  _____________________________________________________________________  External Attachment:    Type:   Image     Comment:   External Document

## 2010-03-21 NOTE — Letter (Signed)
Summary: Ebony Scott   Imported By: Lester  01/09/2010 11:16:26  _____________________________________________________________________  External Attachment:    Type:   Image     Comment:   External Document

## 2010-03-21 NOTE — Progress Notes (Signed)
Summary: Ebony [facility] [place] office visit     New Problems: DYSPNEA ON EXERTION (ICD-786.09)   New Problems: DYSPNEA ON EXERTION (ICD-786.09) New/Updated Medications: PROPYLTHIOURACIL 50 MG TABS (PROPYLTHIOURACIL) two times a day ULTRA NATALCARE 90-1 MG TABS (PRENATAL VIT-DSS-FE CBN-FA) once daily CREON 12000 UNIT CPEP (PANCRELIPASE (LIP-PROT-AMYL)) once daily ALLEGRA-D ALLERGY & CONGESTION 60-120 MG XR12H-TAB (FEXOFENADINE-PSEUDOEPHEDRINE) once daily VITAMIN D3 1000 UNIT CAPS (CHOLECALCIFEROL) once daily NEURONTIN 800 MG TABS (GABAPENTIN) once dail (pt has 900mg  on Rx list) NYSTATIN-TRIAMCINOLONE 100000-0.1 UNIT/GM-% CREA (NYSTATIN-TRIAMCINOLONE) once daily, as needed LOVAZA 1 GM CAPS (OMEGA-3-ACID ETHYL ESTERS) once daily LIDODERM 5 % PTCH (LIDOCAINE) evry 24 hr, as needed ALPHA-LIPOIC ACID 600 MG CAPS (ALPHA-LIPOIC ACID)  MELATONIN 3 MG CAPS (MELATONIN) (pt taking 9mg ) once daily KOREAN GINSENG 1000 MG TABS (GINSENG) once daily CHELATED POTASSIUM 99 MG TABS (POTASSIUM) once daily (dose unkown) * MAGIC MOUTH WASH once daily, as needed VITAMIN B COMPLEX  CAPS (B COMPLEX VITAMINS) once daily, (1000mg ) OLIVE LEAF EXTRACT 250 MG CAPS (OLIVE LEAF) once daily (dose unknown) TYLENOL EXTRA STRENGTH 500 MG TABS (ACETAMINOPHEN) as needed CITRACAL MAXIMUM 315-250 MG-UNIT TABS (CALCIUM CITRATE-VITAMIN D) 2 once daily * BIOFREEZE as needed as needed for pain PRO-FLORA CONCENTRATE  CAPS (PROBIOTIC PRODUCT) once daily   Patient Instructions: 1)  Your physician has requested that you have a stress echocardiogram. For further information please visit [email or web address]  Please follow instruction sheet as given. 2)  Your physician recommends that you schedule a follow-up appointment as needed with Dr Shirlee Latch.    Visit Type:  Follow-up  CC:  tired and sob.     Current Medications (verified): 1)  Astelin 137 Mcg/spray  Soln (Azelastine Hcl) .... As Needed 2)  Nasonex 50 Mcg/act  Susp  (Mometasone Furoate) .... As Needed 3)  Vagifem 25 Mcg  Tabs (Estradiol) .... 2-3 Times Per Week 4)  Crestor 10 Mg  Tabs (Rosuvastatin Calcium) .... Take 1 Tablet By Mouth At Bedtime 5)  Propylthiouracil 50 Mg Tabs (Propylthiouracil) .... Two Times A Day 6)  Ultra Natalcare 90-1 Mg Tabs (Prenatal Vit-Dss-Fe Cbn-Fa) .... Once Daily 7)  Creon 12000 Unit Cpep (Pancrelipase (Lip-Prot-Amyl)) .... Once Daily 8)  Allegra-D Allergy & Congestion 60-120 Mg Xr12h-Tab (Fexofenadine-Pseudoephedrine) .... Once Daily 9)  Vitamin D3 1000 Unit Caps (Cholecalciferol) .... Once Daily 10)  Neurontin 800 Mg Tabs (Gabapentin) .... Once Dail (Pt Has 900mg  On Rx List) 11)  Nystatin-Triamcinolone 100000-0.1 Unit/gm-% Crea (Nystatin-Triamcinolone) .... Once Daily, As Needed 12)  Lovaza 1 Gm Caps (Omega-3-Acid Ethyl Esters) .... Once Daily 13)  Lidoderm 5 % Ptch (Lidocaine) .... Evry 24 Hr, As Needed 14)  Alpha-Lipoic Acid 600 Mg Caps (Alpha-Lipoic Acid) 15)  Melatonin 3 Mg Caps (Melatonin) .... (Pt Taking 9mg ) Once Daily 16)  Korean Ginseng 1000 Mg Tabs (Ginseng) .... Once Daily 17)  Chelated Potassium 99 Mg Tabs (Potassium) .... Once Daily (Dose Unkown) 18)  Magic Mouth Wash .... Once Daily, As Needed 19)  Vitamin B Complex  Caps (B Complex Vitamins) .... Once Daily, (1000mg ) 20)  Olive Leaf Extract 250 Mg Caps (Olive Leaf) .... Once Daily (Dose Unknown) 21)  Tylenol Extra Strength 500 Mg Tabs (Acetaminophen) .... As Needed 22)  Citracal Maximum 315-250 Mg-Unit Tabs (Calcium Citrate-Vitamin D) .... 2 Once Daily 23)  Biofreeze .... As Needed As Needed For Pain 24)  Pro-Flora Concentrate  Caps (Probiotic Product) .... Once Daily  Allergies (verified): 1)  ! Morphine Sulfate Cr (Morphine Sulfate) 2)  ! Penicillin V Potassium (Penicillin V Potassium) 3)  !  Tetracycline Hcl (Tetracycline Hcl) 4)  ! Lortab 10 (Hydrocodone-Acetaminophen) 5)  ! Paxil (Paroxetine Hcl)   Vital Signs:  Patient profile:   60 year old  female Height:      62 inches Weight:      114 pounds BMI:     20.93 Pulse rate:   72 / minute BP sitting:   122 / 64  (left arm) Cuff size:   regular  Vitals Entered By: Caralee Ates CMA (November 24, 2009 4:13 PM)

## 2010-03-21 NOTE — Procedures (Signed)
Summary: EGD: Gastritis  EGD: Gastritis   Imported By: Francee Piccolo CMA (AAMA) 12/30/2009 09:58:21  _____________________________________________________________________  External Attachment:    Type:   Image     Comment:   External Document

## 2010-03-21 NOTE — Assessment & Plan Note (Signed)
Summary: epigastric pain, gas...e.m   History of Present Illness Visit Type: Initial Consult Primary GI MD: Stan Head MD [facility] Primary Provider: Rudi Heap, MD  Requesting Provider: Leo Grosser, MD Chief Complaint: Bloating  History of Present Illness:   60 yo ww with IBS and predominant problems with gas and bloating over the years. She has been seen and treated by Dr. Matthias Hughs over the years. extensive records reviewed.  In early September 2011 she had a respiratory illness treated with antibiotics and steroids. She returned and had more antibiotics. Subsequently she started an inhaler - Symbicort. Failed stress test but passed nuclear test. She has seen Dr. Delford Field. ? Hypersensitivity, allergy problems.  Avelox rx 11/4, upset her stomach quite a bit.  She does not eat a lot due to gas and bloating. she drinks a mixture of apple juice and water instead of straight water. Align (1 year) and  ultimate probiotic formula (3 years) used. Still has bloating and belching problems. She thinks she is lactose intolerant but has not been tested.  Using lactose-free milk if at all.  She self-started potasium gluconate and some OTC homeopathibg supplement with quinine for leg cramps recently. If she takes her medications on an empty stomach her stomach will bother her.   GI Review of Systems    Reports belching, bloating, and  chest pain.      Denies abdominal pain, acid reflux, dysphagia with liquids, dysphagia with solids, heartburn, loss of appetite, nausea, vomiting, vomiting blood, weight loss, and  weight gain.        Denies anal fissure, black tarry stools, change in bowel habit, constipation, diarrhea, diverticulosis, fecal incontinence, heme positive stool, hemorrhoids, irritable bowel syndrome, jaundice, light color stool, liver problems, rectal bleeding, and  rectal pain.    Clinical Reports Reviewed:  Colonoscopy:  04/20/2005:  Normal Performed by Dr.  Matthias Hughs  02/10/2001:  Internal hemorrhoids Performed by Dr. Matthias Hughs  EGD:  01/06/2007:  Normal Biopsies taken in gastric antrum and Duodenum-BENIGN GASTRIC/DUODENAL MUCOSAL  04/20/2005:  Gastritis Performed by Dr. Matthias Hughs  [place] of Abdomen  Procedure date:  04/11/2007  Findings:      Gallstones + sono Murphy's sign  Gastric Emptying Study  Procedure date:  04/02/2007  Findings:      26.6% retention at 2 hours  NORMAL  CT of Abdomen  Procedure date:  03/31/2007  Findings:      Two foci in gallbladder with ? of air in gallbladder or suspected stones Otherwise negative   Current Medications (verified): 1)  Astelin 137 Mcg/spray  Soln (Azelastine Hcl) .... As Needed 2)  Nasonex 50 Mcg/act  Susp (Mometasone Furoate) .... 2 Sprays Each Nostril Once Daily 3)  Vagifem 25 Mcg  Tabs (Estradiol) .... As Needed 4)  Crestor 10 Mg  Tabs (Rosuvastatin Calcium) .... Take 1 Tablet By Mouth At Bedtime 5)  Propylthiouracil 50 Mg Tabs (Propylthiouracil) .... Two Times A Day 6)  Creon 12000 Unit Cpep (Pancrelipase (Lip-Prot-Amyl)) .... Once Daily 7)  Allegra-D Allergy & Congestion 60-120 Mg Xr12h-Tab (Fexofenadine-Pseudoephedrine) .... Once Daily 8)  Vitamin D3 1000 Unit Caps (Cholecalciferol) .... Once Daily 9)  Neurontin 600 Mg Tabs (Gabapentin) .... Once Daily 10)  Nystatin-Triamcinolone 100000-0.1 Unit/gm-% Crea (Nystatin-Triamcinolone) .... Once Daily, As Needed 11)  Lidoderm 5 % Ptch (Lidocaine) .... Evry 24 Hr, As Needed 12)  Alpha-Lipoic Acid 600 Mg Caps (Alpha-Lipoic Acid) .... Once Daily 13)  Melatonin 3 Mg Caps (Melatonin) .... (Pt Taking 9mg ) Once Daily 14)  Ra Potassium  Gluconate 595 Mg Tabs (Potassium Gluconate) .... One Tablet By Mouth Once Daily 15)  Magic Mouth Wash .... Once Daily, As Needed 16)  Vitamin B Complex  Caps (B Complex Vitamins) .... Once Daily, (1000mg ) 17)  Tylenol Extra Strength 500 Mg Tabs (Acetaminophen) .... 2 Two Times A Day 18)  Citracal Maximum  315-250 Mg-Unit Tabs (Calcium Citrate-Vitamin D) .... 2 Once Daily 19)  Biofreeze .... As Needed As Needed For Pain 20)  Pro-Flora Concentrate  Caps (Probiotic Product) .... Once Daily 21)  Systane Ultra 0.4-0.3 % Soln (Polyethyl Glycol-Propyl Glycol) .... 2 Gtts Each Eye Two Times A Day 22)  Align  Caps (Probiotic Product) .... Once Daily 23)  Ayr Saline Nasal  Gel (Saline) .... Once Daily 24)  Hyland's Leg Cramps .... Once Daily 25)  Qvar 40 Mcg/act  Aers (Beclomethasone Dipropionate) .... Two Puffs Twice Daily 26)  Aerochamber Mv  Misc (Spacer/aero-Holding Chambers) .... Use With Qvar 27)  Ginseng Root   Powd (Ginseng Root) .... Once Daily 28)  Super Probiotic  Caps (Probiotic Product) .... Will Take One Tablet By Mouth When Not Taking Align 29)  Vinate Multi Tablet(Dosage Unknown) .... One Tablet By Mouth Once Daily 30)  Lactose Fast Acting Relief 9000 Unit Tabs (Lactase) .... As Directed 31)  Glucosamine-Chondroitin  Caps (Glucosamine-Chondroit-Vit C-Mn) .... One Tablet By Mouth Once Daily  Allergies (verified): 1)  ! Morphine Sulfate Cr (Morphine Sulfate) 2)  ! Penicillin V Potassium (Penicillin V Potassium) 3)  ! Tetracycline Hcl (Tetracycline Hcl) 4)  ! Lortab 10 (Hydrocodone-Acetaminophen) 5)  ! Paxil (Paroxetine Hcl) 6)  ! Vioxx 7)  ! Oxycodone Hcl 8)  ! * Shrimp 9)  ! * Milk 10)  ! Levaquin 11)  ! * Cotton Seed 12)  ! * Egg 13)  ! * Strawberries 14)  ! * Soy  Past History:  Past Medical History: chronic neck pain Allergic rhinitis Breast cancer--Left GERD Hypothyroidism Osteoporosis - Reclast Tx Hiatal Hernia Diverticulosis Hemorrhoids Depression Hyperlipidemia  Past Surgical History: Reviewed history from 12/30/2009 and no changes required. discectomy  C3-C4  2003 discectomy  C6-T  09/03 Hysterectomy  1988 Tonsillectomy, adenoidectomy as child deviated septum  1980's ant post repair  1990's Lumpectomy  1998 L breast Cholecystectomy R. Elbow  surgery L. Knee surgery R. 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  Daily Caffeine Use: 2-3 daily   Review of Systems       The patient complains of allergy/sinus, arthritis/joint pain, back pain, change in vision, fatigue, muscle pains/cramps, shortness of breath, and vision changes.         All other ROS negative except as per HPI.   Vital Signs:  Patient profile:   60 year old female Height:      61 inches Weight:      112 pounds BMI:     21.24 BSA:     1.48 Pulse rate:   76 / minute Pulse rhythm:   regular BP sitting:   124 / 68  (left arm) Cuff size:   regular  Vitals Entered By: Ok Anis CMA (January 02, 2010 9:00 AM)  Physical  Exam  General:  Well developed, well nourished, no acute distress. Head:  Normocephalic and atraumatic. Eyes:  PERRLA, no icterus. Mouth:  No deformity or lesions, dentition normal. Neck:  Supple; no masses or thyromegaly. Lungs:  Clear throughout to auscultation. Heart:  Regular rate and rhythm; no murmurs, rubs,  or bruits. Abdomen:  Soft, nontender and nondistended. No masses, hepatosplenomegaly or hernias noted. Normal bowel sounds. Extremities:  no edema Cervical Nodes:  No significant cervical or supraclavicular adenopathy.  Inguinal Nodes:  No significant inguinal adenopathy. Psych:  Alert and cooperative. Normal mood and affect.  extensive records of operations, office notes (primary and GI care), xray reports, EGD, colonoscopy, labs, pathology reviwed  Impression & Recommendations:  Problem # 1:  IRRITABLE BOWEL SYNDROME  (ICD-564.1) Assessment New This is a complicated issue. Previously followed by Dr. Matthias Hughs with excellent care as far as I can tell. However when I told her she had this she said "I do?". she is on ancreatic enzymes, celiac testing looks negative but did not see an IgA levels. She now has respiratory allergy issues.  I think bacterial overgrowth, food sensitivity (FODMAPS) , ? lactose - I will recommend she try low FODMAPS diet. consider nreath testing for bacterial overgrowth vs. empiric Abx trial pending results of diet changes. She is on too many supplements Will ask her to stop what was not prescribed  and regroup. i will call her with recommendations. I think she should   Problem # 2:  ABDOMINAL BLOATING (ICD-787.3) Assessment: New A prominent part of her IBS problems. FODMAP diet seems worth a try.  Problem # 3:  LOOSE STOOLS (ICD-787.91) Soft stools chronically. IBS seems most likely.  Patient Instructions: 1)  Irritable Bowel Syndrome seems to be your problem. There are multiple possible causes as we discussed.  2)  Look at FODMAP diet information on the internet as we discussed. 3)  Keep a food diary with associated symptoms over the next week. 4)  Dr. Leone Payor will call you to discuss the plan for you by next week. 5)  Hold all supplements unless recommended or prescribed by a health care provider (MD, Nurse Practitioner, Physician Assistant). 6)  Please make a list today of all of the supplements you have at home and are taking. 7)  Copy sent to : Rudi Heap, MD 8)  The medication list was reviewed and reconciled.  All changed / newly prescribed medications were explained.  A complete medication list was provided to the patient / caregiver.

## 2010-03-21 NOTE — Letter (Signed)
Summary: [facility]   Imported By: Lester Pima 01/09/2010 11:12:28  _____________________________________________________________________  External Attachment:    Type:   Image     Comment:   External Document

## 2010-03-21 NOTE — Letter (Signed)
Summary: [facility]   Imported By: Lester Islandia 01/09/2010 11:33:31  _____________________________________________________________________  External Attachment:    Type:   Image     Comment:   External Document

## 2010-03-21 NOTE — Assessment & Plan Note (Signed)
Summary: Pulmonary OV   Copy to:  Dr. Lynnea Ferrier Primary Marjo Grosvenor/Referring Klay Sobotka:  Dr. Candie Echevaria  CC:  Follow up.  Pt states breathing is unchanged.  States she still has SOB with very little activity.  Cough - prod at times with yellow/gray mucus..  History of Present Illness:  Initial Pulmonary Consultation 12/23/09   12/23/09-     This is a 60 year old female who presents with dyspnea.  The patient complains of shortness of breath,   The patient reports a history of allergic rhinitis and cancer:.  The pt is dyspneic if does any activity There is a productive cough,  was present in early sept.  rx abx and pred and stopped the cough The pred  helped some but still heavy in chest There is hx of mold exposure in church basement 2/ to 04/2009.  felt more fatigued  8hr shift.  There was  wetness in the basement.  pipe backup  09/2009 in church basement The pt has been on symbicort for 3 weeks without much improvement. >>symbicort changed to QVAR, CT sinus neg. RAST /IGE, Hypersensitivty/fungal panel pending  01/02/10--Presents for follow up and med review. Pt presents today with >30 med w/ rx and otc/herbal products. Was seen by GI the am w/ recs to hold  all herbal products that have not been recommended by medical providers. We reviewed all her meds and organized them into a med calendar. She has seen no change in her breathing since last ov. Denies chest pain,  orthopnea, hemoptysis, fever, n/v/d, edema, headache. She has an extensive list of herbals, we left on the ones rec by her MD and held all other one  January 23, 2010 10:50 AM At the last OV was pt initial consult 12/23/09 then pt went to NP for med calendar. Now cough is less.  Cough is not as often.  Had skin testing at [facility]yrs : reactions to mold, dust, gress , trees, cat dander,  various food allergies.  Has had allergy immunotherapy for years and helped then stopped.   Dyspnea is unchanged. cannot vacuum a room without  dyspnea. If dusts wears a mask and is no different.  Asthma History    Initial Asthma Severity Rating:    Age range: 12+ years    Symptoms: >2 days/week; not daily    Nighttime Awakenings: 0-2/month    Interferes w/ normal activity: minor limitations    SABA use (not for EIB): >2 days/week but not >1X/day    Exacerbations requiring oral systemic steroids: 0-1/year    Asthma Severity Assessment: Mild Persistent   Current Medications (verified): 1)  Qvar 40 Mcg/act  Aers (Beclomethasone Dipropionate) .... Two Puffs Twice Daily 2)  Nasonex 50 Mcg/act  Susp (Mometasone Furoate) .... 2 Sprays Each Nostril  Every Morning 3)  Allegra-D Allergy & Congestion 60-120 Mg Xr12h-Tab (Fexofenadine-Pseudoephedrine) .... Once Daily 4)  Vinate Ultra  Tabs (Prenatal Vit-Dss-Fe Cbn-Fa) .... Take 1 Tablet By Mouth Once A Day 5)  Mucinex 600 Mg Xr12h-Tab (Guaifenesin) .... Take 1 Tablet By Mouth Once A Day 6)  Citracal Maximum 315-250 Mg-Unit Tabs (Calcium Citrate-Vitamin D) .... Take 1 Tablet By Mouth Two Times A Day 7)  Vitamin D 2000 Unit Tabs (Cholecalciferol) .... 1/2 Tab By Mouth At Bedtime 8)  Melatonin 3 Mg Tabs (Melatonin) .... 3 Tabs By Mouth At Bedtime 9)  Propylthiouracil 50 Mg Tabs (Propylthiouracil) .... Take 1 Tablet By Mouth Two Times A Day 10)  Crestor 20 Mg  Tabs (Rosuvastatin Calcium) .... 1/2 Tab By Mouth At Bedtime 11)  Alpha-Lipoic Acid 600 Mg Caps (Alpha-Lipoic Acid) .... Once Daily 12)  Gabapentin 600 Mg Tabs (Gabapentin) .... Take 1 Tablet By Mouth Once A Day 13)  Creon 12000 Unit Cpep (Pancrelipase (Lip-Prot-Amyl)) .... Take 1 Capsule By Mouth Three Times A Day 14)  Align  Caps (Probiotic Product) .... Once Daily 15)  Tylenol Extra Strength 500 Mg Tabs (Acetaminophen) .... 2 Tabs By Mouth Two Times A Day 16)  Systane Ultra 0.4-0.3 % Soln (Polyethyl Glycol-Propyl Glycol) .Marland Kitchen.. 1 Drop Each Eye  Every Morning and At Bedtime 17)  Lidoderm 5 % Ptch (Lidocaine) .... Apply Patch in The  Morning and Remove At Bedtime As Needed 18)  Lactaid 3000 Unit Tabs (Lactase) .... Per Box As Needed 19)  Astelin 137 Mcg/spray  Soln (Azelastine Hcl) .... 2 Puffs Each Nostril Two Times A Day As Needed 20)  Nystatin-Triamcinolone 100000-0.1 Unit/gm-% Crea (Nystatin-Triamcinolone) .... Apply As Needed 21)  Ayr Saline Nasal  Gel (Saline) .... Apply As Needed 22)  Beano  Tabs (Alpha-D-Galactosidase) .... Per Box 23)  Gas-X Extra Strength 125 Mg Caps (Simethicone) .... Per Box 24)  Proctozone-Hc 2.5 % Crea (Hydrocortisone) .... As Needed 25)  Aerochamber Mv  Misc (Spacer/aero-Holding Chambers) .... Use With Qvar 26)  Metronidazole 250 Mg Tabs (Metronidazole) .... Take 1 Tablet By Mouth Three Times A Day 27)  Clotrimazole 1 % Soln (Clotrimazole) .... Two Times A Day  Allergies (verified): 1)  ! Morphine Sulfate Cr (Morphine Sulfate) 2)  ! Penicillin V Potassium (Penicillin V Potassium) 3)  ! Tetracycline Hcl (Tetracycline Hcl) 4)  ! Lortab 10 (Hydrocodone-Acetaminophen) 5)  ! Paxil (Paroxetine Hcl) 6)  ! Vioxx 7)  ! Oxycodone Hcl 8)  ! * Shrimp 9)  ! * Milk 10)  ! Levaquin 11)  ! * Cotton Seed 12)  ! * Egg 13)  ! * Strawberries 14)  ! * Soy  Past History:  Past medical, surgical, family and social histories (including risk factors) reviewed, and no changes noted (except as noted below).  Past Medical History: chronic neck pain Allergic rhinitis moderate persistent asthma    -FeV1 72% 2011    -IgE 102 2011    -CT sinus Neg 2011 Breast cancer GERD Hypothyroidism Osteoporosis Hiatal Hernia Diverticulosis Hemorrhoids COMPLEX MED REGIMEN-Meds reviewed with pt education and computerized med calendar completed 01/02/10   Past Surgical History: Reviewed history from 12/30/2009 and no changes required. discectomy  C3-C4  2003 discectomy  C6-T  09/03 Hysterectomy  1988 Tonsillectomy, adenoidectomy as child deviated septum  1980's ant post repair  1990's Lumpectomy  1998 L  breast Cholecystectomy R. Elbow surgery L. Knee surgery R. Hand surgery  Family History: Reviewed history from 01/02/2010 and no changes required. allergies - mother, father heart disease - mother, father arthritis - mother, father mother deceased from lung ca Family History of Colon Cancer: Maternal Half Uncle, and Half Maternal Aunt  Family History of Colitis: Daughter   Social History: Reviewed history from 01/02/2010 and no changes required. 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  Daily Caffeine Use: 2-3 daily   Review of Systems       The patient complains of shortness of breath with activity.  The patient denies shortness of breath at rest, productive cough, non-productive cough, coughing up blood, chest pain, irregular heartbeats, acid heartburn, indigestion, loss  of appetite, weight change, abdominal pain, difficulty swallowing, sore throat, tooth/dental problems, headaches, nasal congestion/difficulty breathing through nose, sneezing, itching, ear ache, anxiety, depression, hand/feet swelling, joint stiffness or pain, rash, change in color of mucus, and fever.    Vital Signs:  Patient profile:   60 year old female Height:      61 inches Weight:      111.13 pounds BMI:     21.07 O2 Sat:      99 % on Room air Temp:     98.0 degrees F oral Pulse rate:   75 / minute BP sitting:   130 / 84  (right arm) Cuff size:   regular  Vitals Entered By: Gweneth Dimitri RN (January 23, 2010 10:43 AM)  O2 Flow:  Room air CC: Follow up.  Pt states breathing is unchanged.  States she still has SOB with very little activity.  Cough - prod at times with yellow/gray mucus. Comments Medications reviewed with patient Daytime contact number verified with patient. Gweneth Dimitri RN  January 23, 2010 10:44 AM    Physical Exam  Additional Exam:  Gen: Pleasant,  well-nourished, in no distress,  normal affect ENT: No lesions,  mouth clear,  oropharynx clear, no postnasal drip Neck: No JVD, no TMG, no carotid bruits Lungs: No use of accessory muscles, no dullness to percussion, distant BS Cardiovascular: RRR, heart sounds normal, no murmur or gallops, no peripheral edema Abdomen: soft and NT, no HSM,  BS normal Musculoskeletal: No deformities, no cyanosis or clubbing Neuro: alert, non focal Skin: Warm, no lesions or rashes    Impression & Recommendations:  Problem # 1:  EXTRINSIC ASTHMA, UNSPECIFIED (ICD-493.00) Assessment Deteriorated Moderate persistent asthma with mild airflow obstruction and elevated IgE on testing , not improved despite ICS plan d/c all oil based vitamins>>>done simplify med regimen/obtain med calendar:  done d/c qvar start Dulera 200  two puff two times a day pulse prednisone  may yet need xolair  Medications Added to Medication List This Visit: 1)  Dulera 200-5 Mcg/act Aero (Mometasone furo-formoterol fum) .... 2 puffs twice per day 2)  Nasonex 50 Mcg/act Susp (Mometasone furoate) .... 2 sprays each nostril  every morning 3)  Melatonin 3 Mg Tabs (Melatonin) .... 3 tabs by mouth at bedtime 4)  Metronidazole 250 Mg Tabs (Metronidazole) .... Take 1 tablet by mouth three times a day 5)  Clotrimazole 1 % Soln (Clotrimazole) .... Two times a day 6)  Prednisone 10 Mg Tabs (Prednisone) .... Take as directed 4 each am x3days, 3 x 3days, 2 x 3days, 1 x 3days then stop  Complete Medication List: 1)  Dulera 200-5 Mcg/act Aero (Mometasone furo-formoterol fum) .... 2 puffs twice per day 2)  Nasonex 50 Mcg/act Susp (Mometasone furoate) .... 2 sprays each nostril  every morning 3)  Allegra-d Allergy & Congestion 60-120 Mg Xr12h-tab (Fexofenadine-pseudoephedrine) .... Once daily 4)  Vinate Ultra Tabs (Prenatal vit-dss-fe cbn-fa) .... Take 1 tablet by mouth once a day 5)  Mucinex 600 Mg Xr12h-tab (Guaifenesin) .... Take 1 tablet by  mouth once a day 6)  Citracal Maximum 315-250 Mg-unit Tabs (Calcium citrate-vitamin d) .... Take 1 tablet by mouth two times a day 7)  Vitamin D 2000 Unit Tabs (Cholecalciferol) .... 1/2 tab by mouth at bedtime 8)  Melatonin 3 Mg Tabs (Melatonin) .... 3 tabs by mouth at bedtime 9)  Propylthiouracil 50 Mg Tabs (Propylthiouracil) .... Take 1 tablet by mouth two times a day 10)  Crestor 20  Mg Tabs (Rosuvastatin calcium) .... 1/2 tab by mouth at bedtime 11)  Alpha-lipoic Acid 600 Mg Caps (Alpha-lipoic acid) .... Once daily 12)  Gabapentin 600 Mg Tabs (Gabapentin) .... Take 1 tablet by mouth once a day 13)  Creon 12000 Unit Cpep (Pancrelipase (lip-prot-amyl)) .... Take 1 capsule by mouth three times a day 14)  Align Caps (Probiotic product) .... Once daily 15)  Tylenol Extra Strength 500 Mg Tabs (Acetaminophen) .... 2 tabs by mouth two times a day 16)  Systane Ultra 0.4-0.3 % Soln (Polyethyl glycol-propyl glycol) .Marland Kitchen.. 1 drop each eye  every morning and at bedtime 17)  Lidoderm 5 % Ptch (Lidocaine) .... Apply patch in the morning and remove at bedtime as needed 18)  Lactaid 3000 Unit Tabs (Lactase) .... Per box as needed 19)  Astelin 137 Mcg/spray Soln (Azelastine hcl) .... 2 puffs each nostril two times a day as needed 20)  Nystatin-triamcinolone 100000-0.1 Unit/gm-% Crea (Nystatin-triamcinolone) .... Apply as needed 21)  Ayr Saline Nasal Gel (Saline) .... Apply as needed 22)  Beano Tabs (Alpha-d-galactosidase) .... Per box 23)  Gas-x Extra Strength 125 Mg Caps (Simethicone) .... Per box 24)  Proctozone-hc 2.5 % Crea (Hydrocortisone) .... As needed 25)  Aerochamber Mv Misc (Spacer/aero-holding chambers) .... Use with qvar 26)  Clotrimazole 1 % Soln (Clotrimazole) .... Two times a day 27)  Prednisone 10 Mg Tabs (Prednisone) .... Take as directed 4 each am x3days, 3 x 3days, 2 x 3days, 1 x 3days then stop  Other Orders: Spirometry w/Graph (94010) Est. Patient Level V (16109)  Patient  Instructions: 1)  Stop Qvar 2)  Start Dulera two puff twice a day, use aerochamber 3)  check peak flow rates twice a day and record results 4)  Prednisone 10mg  4 each am x3days, 3 x 3days, 2 x 3days, 1 x 3days then stop 5)  We may consider Xolair for you , will see how you respond to [facility] 6)  Return 1 month  Prescriptions: PREDNISONE 10 MG  TABS (PREDNISONE) Take as directed 4 each am x3days, 3 x 3days, 2 x 3days, 1 x 3days then stop  #30 x 6   Entered and Authorized by:   Storm Frisk MD   Signed by:   Storm Frisk MD on 01/23/2010   Method used:   Electronically to        [facility]* (retail)       [address] [place], [place]  [address]       Ph: [phone] or [phone]       Fax: [phone]   RxID:   [id number] DULERA 200-5 MCG/ACT AERO (MOMETASONE FURO-FORMOTEROL FUM) 2 puffs twice per day  #1 x 6   Entered and Authorized by:   Storm Frisk MD   Signed by:   Storm Frisk MD on 01/23/2010   Method used:   Electronically to        [facility]* (retail)       [address] [place], [place]  [address]       Ph: [phone] or [phone]       Fax: [phone]   RxID:   [id number]     Appended Document: Pulmonary OV fax don Christell Constant

## 2010-03-21 NOTE — Letter (Signed)
Summary: [facility]   Imported By: Lester Garnet 01/09/2010 11:15:20  _____________________________________________________________________  External Attachment:    Type:   Image     Comment:   External Document

## 2010-03-21 NOTE — Assessment & Plan Note (Signed)
Summary: Pulmonary Consultation   Copy to:  Dr. Lynnea Ferrier Primary Provider/Referring Provider:  Dr. Candie Echevaria  CC:  Pulmonary Consult for DOE. and dyspnea.  History of Present Illness: Pulmonary Consultation      This is a 60 year old female who presents with dyspnea.  The patient complains of shortness of breath, but denies history of diagnosed COPD, chest tightness, chest pain worse with breathing and coughing, wheezing, cough, mucous production, nocturnal awakening, exercise induced symptoms, and congestion.  The cough is described as insignificant.  The dyspnea is described as dyspnea at rest and dyspnea with exertion.  The patient reports a history of allergic rhinitis and cancer:.  The patient denies any history of asthma, COPD, sleep disordered breathing, obstructive sleep apnea, heart disease, thyroid disease, anemia, collagen vascular disease, osteporosis, kyphoscoliosis, occupational exposure: , and cancer: .  Prior effective therapies include oral inhaled steroids and antibiotics.    The pt is dyspneic if does any activity There is a productive cough,  was present in early sept.  rx abx and pred and stopped the cough The pred  helped some but still heavy in chest There is hx of mold exposure in church basement 2/ to 04/2009.  felt more fatigued  8hr shift.  There was  wetness in the basement.  pipe backup  09/2009 in church basement The pt has been on symbicort for 3 weeks without much improvement.   Preventive Screening-Counseling & Management  Alcohol-Tobacco     Smoking Status: never     Passive Smoke Exposure: yes  Current Medications (verified): 1)  Astelin 137 Mcg/spray  Soln (Azelastine Hcl) .... As Needed 2)  Nasonex 50 Mcg/act  Susp (Mometasone Furoate) .... 2 Sprays Each Nostril Once Daily 3)  Vagifem 25 Mcg  Tabs (Estradiol) .... As Needed 4)  Crestor 10 Mg  Tabs (Rosuvastatin Calcium) .... Take 1 Tablet By Mouth At Bedtime 5)  Propylthiouracil 50 Mg Tabs  (Propylthiouracil) .... Two Times A Day 6)  Ultra Natalcare 90-1 Mg Tabs (Prenatal Vit-Dss-Fe Cbn-Fa) .... Once Daily 7)  Creon 12000 Unit Cpep (Pancrelipase (Lip-Prot-Amyl)) .... Once Daily 8)  Allegra-D Allergy & Congestion 60-120 Mg Xr12h-Tab (Fexofenadine-Pseudoephedrine) .... Once Daily 9)  Vitamin D3 1000 Unit Caps (Cholecalciferol) .... Once Daily 10)  Neurontin 600 Mg Tabs (Gabapentin) .... Once Daily 11)  Nystatin-Triamcinolone 100000-0.1 Unit/gm-% Crea (Nystatin-Triamcinolone) .... Once Daily, As Needed 12)  Lovaza 1 Gm Caps (Omega-3-Acid Ethyl Esters) .... Once Daily 13)  Lidoderm 5 % Ptch (Lidocaine) .... Evry 24 Hr, As Needed 14)  Alpha-Lipoic Acid 600 Mg Caps (Alpha-Lipoic Acid) .... Once Daily 15)  Melatonin 3 Mg Caps (Melatonin) .... (Pt Taking 9mg ) Once Daily 16)  Chelated Potassium 99 Mg Tabs (Potassium) .... Once Daily (Dose Unkown) 17)  Magic Mouth Wash .... Once Daily, As Needed 18)  Vitamin B Complex  Caps (B Complex Vitamins) .... Once Daily, (1000mg ) 19)  Olive Leaf Extract 250 Mg Caps (Olive Leaf) .... Once Daily (Dose Unknown) 20)  Tylenol Extra Strength 500 Mg Tabs (Acetaminophen) .... 2 Two Times A Day 21)  Citracal Maximum 315-250 Mg-Unit Tabs (Calcium Citrate-Vitamin D) .... 2 Once Daily 22)  Biofreeze .... As Needed As Needed For Pain 23)  Pro-Flora Concentrate  Caps (Probiotic Product) .... Once Daily 24)  Systane Ultra 0.4-0.3 % Soln (Polyethyl Glycol-Propyl Glycol) .... 2 Gtts Each Eye Two Times A Day 25)  Evening Primrose Oil 1000 Mg Caps (Evening Primrose Oil) .... Two Times A Day 26)  Align  Caps (Probiotic Product) .... Once Daily 27)  Ayr Saline Nasal  Gel (Saline) .... Once Daily 28)  Hyland's Leg Cramps .... Once Daily 29)  Symbicort 160-4.5 Mcg/act Aero (Budesonide-Formoterol Fumarate) .... Inhale 2 Puffs Two Times A Day  Allergies (verified): 1)  ! Morphine Sulfate Cr (Morphine Sulfate) 2)  ! Penicillin V Potassium (Penicillin V Potassium) 3)  !  Tetracycline Hcl (Tetracycline Hcl) 4)  ! Lortab 10 (Hydrocodone-Acetaminophen) 5)  ! Paxil (Paroxetine Hcl) 6)  ! Vioxx 7)  ! * Oxycodone  Past History:  Past medical, surgical, family and social histories (including risk factors) reviewed, and no changes noted (except as noted below).  Past Medical History: Reviewed history from 11/12/2006 and no changes required. chronic neck pain Allergic rhinitis Breast cancer, hx of GERD Hypothyroidism Osteoporosis  Past Surgical History: discectomy  C3-C4  2003 discectomy  C6-T  09/03 Hysterectomy  1988 Tonsillectomy, adenoidectomy as child deviated septum  1980's ant post repair  1990's Lumpectomy  1998 L breast Cholecystectomy  Family History: Reviewed history and no changes required. allergies - mother, father heart disease - mother, father arthritis - mother, father mother deceased from lung ca  Social History: Reviewed history and no changes required. 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  The patient denies shortness of breath with activity, non-productive cough, coughing up blood, chest pain, irregular heartbeats, acid heartburn, indigestion, difficulty swallowing, sore throat, tooth/dental problems, headaches, nasal congestion/difficulty breathing through nose, itching, hand/feet swelling, rash, change in color of mucus, and fever.        See HPI for Pulmonary, Cardiac, General, and ENT review of systems.  Vital Signs:  Patient profile:   60 year old female Height:      61 inches Weight:      114.13 pounds BMI:     21.64 O2 Sat:      100 % on Room air Temp:     97.4 degrees F oral Pulse  rate:   76 / minute BP sitting:   118 / 72  (right arm) Cuff size:   regular  Vitals Entered By: Gweneth Dimitri RN (December 23, 2009 8:54 AM)  O2 Flow:  Room air CC: Pulmonary Consult for DOE., dyspnea Comments Medications reviewed with patient Daytime contact number verified with patient. Gweneth Dimitri RN  December 23, 2009 8:54 AM    Physical Exam  Additional Exam:  Gen: Pleasant, well-nourished, in no distress,  normal affect ENT: No lesions,  mouth clear,  oropharynx clear, no postnasal drip Neck: No JVD, no TMG, no carotid bruits Lungs: No use of accessory muscles, no dullness to percussion, distant BSi Cardiovascular: RRR, heart sounds normal, no murmur or gallops, no peripheral edema Abdomen: soft and NT, no HSM,  BS normal Musculoskeletal: No deformities, no cyanosis or clubbing Neuro: alert, non focal Skin: Warm, no lesions or rashes    Echocardiogram  Procedure date:  12/15/2009  Findings:      Normal Lv function and  non dx  stress echo need stress myoview  CXR  Procedure date:  11/08/2009  Findings:      NAD  CT of Chest  Procedure date:  10/12/2009  Findings:      Neg for pulmonary embolus, no other acute process. small bilat pulm nodules unchanged over time   Pulmonary Function Test Date: 12/07/2009 Gender: Female  Pre-Spirometry FVC    Value: 2.30 L/min   % Pred: 86 % FEV1    Value: 1.81 L     % Pred: 83 % FEV1/FVC  Value: 79 %     Pred: 98 %    FEF 25-75  Value: 1.83 L/min   % Pred: 77 %  Evaluation: normal  Impression & Recommendations:  Problem # 1:  DYSPNEA ON EXERTION (ICD-786.09) Assessment Deteriorated Suspect dyspnea on basis of reactive airway disease on basis of atopic factors.  Hx of mold exposure.  RAST neg but IgE elevated >100  Pt with chronic inflammation in sinuses also an issue plan d/c symbicort trial qvar check sinus CT  check hypersens profile note rast neg but IgE elevated stop all olive leaf extracts and  all oil based vitamins  Her updated medication list for this problem includes:    Qvar 40 Mcg/act Aers (Beclomethasone dipropionate) .Marland Kitchen..Marland Kitchen Two puffs twice daily  Orders: New Patient Level V [id number]) T-"RAST" (Allergy Full Profile) IGE [phone]) T-Fungal Panel Immuno ([id number]) T-Hypersens Panel ([id number])  Medications Added to Medication List This Visit: 1)  Nasonex 50 Mcg/act Susp (Mometasone furoate) .... 2 sprays each nostril once daily 2)  Vagifem 25 Mcg Tabs (Estradiol) .... As needed 3)  Neurontin 600 Mg Tabs (Gabapentin) .... Once daily 4)  Lovaza 1 Gm Caps (Omega-3-acid ethyl esters) .... Hold 5)  Alpha-lipoic Acid 600 Mg Caps (Alpha-lipoic acid) .... Once daily 6)  Olive Leaf Extract 250 Mg Caps (Olive leaf) .... Hold 7)  Tylenol Extra Strength 500 Mg Tabs (Acetaminophen) .... 2 two times a day 8)  Systane Ultra 0.4-0.3 % Soln (Polyethyl glycol-propyl glycol) .... 2 gtts each eye two times a day 9)  Evening Primrose Oil 1000 Mg Caps (Evening primrose oil) .... Two times a day 10)  Align Caps (Probiotic product) .... Once daily 11)  Ayr Saline Nasal Gel (Saline) .... Once daily 12)  Hyland's Leg Cramps  .... Once daily 13)  Qvar 40 Mcg/act Aers (Beclomethasone dipropionate) .... Two puffs twice daily 14)  Aerochamber Mv Misc (Spacer/aero-holding chambers) .... Use with qvar  Complete Medication List: 1)  Astelin 137 Mcg/spray Soln (Azelastine hcl) .... As needed 2)  Nasonex 50 Mcg/act Susp (Mometasone furoate) .... 2 sprays each nostril once daily 3)  Vagifem 25 Mcg Tabs (Estradiol) .... As needed 4)  Crestor 10 Mg Tabs (Rosuvastatin calcium) .... Take 1 tablet by mouth at bedtime 5)  Propylthiouracil 50 Mg Tabs (Propylthiouracil) .... Two times a day 6)  Ultra Natalcare 90-1 Mg Tabs (Prenatal vit-dss-fe cbn-fa) .... Once daily 7)  Creon 12000 Unit Cpep (Pancrelipase (lip-prot-amyl)) .... Once daily 8)  Allegra-d Allergy & Congestion 60-120 Mg Xr12h-tab  (Fexofenadine-pseudoephedrine) .... Once daily 9)  Vitamin D3 1000 Unit Caps (Cholecalciferol) .... Once daily 10)  Neurontin 600 Mg Tabs (Gabapentin) .... Once daily 11)  Nystatin-triamcinolone 100000-0.1 Unit/gm-% Crea (Nystatin-triamcinolone) .... Once daily, as needed 12)  Lovaza 1 Gm Caps (Omega-3-acid ethyl esters) .... Hold 13)  Lidoderm 5 % Ptch (Lidocaine) .... Evry 24 hr, as needed 14)  Alpha-lipoic Acid 600 Mg Caps (Alpha-lipoic acid) .... Once daily 15)  Melatonin 3 Mg Caps (Melatonin) .... (pt taking 9mg ) once daily 16)  Chelated Potassium 99 Mg Tabs (Potassium) .... Once daily (dose unkown) 17)  Magic Mouth Wash  .Marland KitchenMarland KitchenMarland Kitchen  Once daily, as needed 18)  Vitamin B Complex Caps (B complex vitamins) .... Once daily, (1000mg ) 19)  Olive Leaf Extract 250 Mg Caps (Olive leaf) .... Hold 20)  Tylenol Extra Strength 500 Mg Tabs (Acetaminophen) .... 2 two times a day 21)  Citracal Maximum 315-250 Mg-unit Tabs (Calcium citrate-vitamin d) .... 2 once daily 22)  Biofreeze  .... As needed as needed for pain 23)  Pro-flora Concentrate Caps (Probiotic product) .... Once daily 24)  Systane Ultra 0.4-0.3 % Soln (Polyethyl glycol-propyl glycol) .... 2 gtts each eye two times a day 25)  Evening Primrose Oil 1000 Mg Caps (Evening primrose oil) .... Two times a day 26)  Align Caps (Probiotic product) .... Once daily 27)  Ayr Saline Nasal Gel (Saline) .... Once daily 28)  Hyland's Leg Cramps  .... Once daily 29)  Qvar 40 Mcg/act Aers (Beclomethasone dipropionate) .... Two puffs twice daily 30)  Aerochamber Mv Misc (Spacer/aero-holding chambers) .... Use with qvar 31)  Avelox 400 Mg Tabs (Moxifloxacin hcl) .... By mouth daily  Other Orders: Radiology Referral (Radiology)  Patient Instructions: 1)  Stop symbicort 2)  Start Qvar two puff twice daily, use aerochamber 3)  Stop olive leaf, lovaza capsules 4)  use non oil based vitamin D 5)  A Sinus CT scan will be obtained 6)  Labs today 7)  Return one  month, I will call with results Prescriptions: AEROCHAMBER MV  MISC (SPACER/AERO-HOLDING CHAMBERS) Use with qvar  #1 x 0   Entered and Authorized by:   Storm Frisk MD   Signed by:   Storm Frisk MD on 12/23/2009   Method used:   Print then Give to Patient   RxID:   [id number] QVAR 40 MCG/ACT  AERS (BECLOMETHASONE DIPROPIONATE) Two puffs twice daily  #1 x 6   Entered and Authorized by:   Storm Frisk MD   Signed by:   Storm Frisk MD on 12/23/2009   Method used:   Print then Give to Patient   RxID:   [id number]    Immunization History:  Influenza Immunization History:    Influenza:  historical (11/08/2009)   Appended Document: Pulmonary Consultation fax Candie Echevaria  Appended Document: Pulmonary Consultation Ms. Akter sees Dr. Vernon Prey at the [facility].  Not Dr. Candie Echevaria.

## 2010-03-21 NOTE — Letter (Signed)
Summary: [facility]  [facility]   Imported By: Lester [place] 01/09/2010 11:29:11  _____________________________________________________________________  External Attachment:    Type:   Image     Comment:   External Document

## 2010-03-21 NOTE — Letter (Signed)
Summary: Ebony Scott   Imported By: Lester [place] 01/09/2010 11:34:39  _____________________________________________________________________  External Attachment:    Type:   Image     Comment:   External Document

## 2010-03-21 NOTE — Progress Notes (Signed)
Summary: Avelox RX  Phone Note Outgoing Call   Reason for Call: Get patient information Summary of Call: Pt left without her antibiotic she needs avelox 400mg  daily x 5 days  I sent rx to her pharmacy Initial call taken by: Storm Frisk MD,  December 23, 2009 11:49 AM  Follow-up for Phone Call        Called, spoke with pt.  She was informed of above and verbalized understanding.   Follow-up by: Gweneth Dimitri RN,  December 23, 2009 12:08 PM    New/Updated Medications: AVELOX 400 MG  TABS (MOXIFLOXACIN HCL) By mouth daily Prescriptions: AVELOX 400 MG  TABS (MOXIFLOXACIN HCL) By mouth daily  #5 x 0   Entered and Authorized by:   Storm Frisk MD   Signed by:   Storm Frisk MD on 12/23/2009   Method used:   Electronically to        [facility]* (retail)       [address] [place], [place]  [address]       Ph: [phone] or [phone]       Fax: [phone]   RxID:   [id number]

## 2010-03-21 NOTE — Letter (Signed)
Summary: Ebony Scott   Imported By: Lester [place] 01/09/2010 11:23:32  _____________________________________________________________________  External Attachment:    Type:   Image     Comment:   External Document

## 2010-03-21 NOTE — Letter (Signed)
Summary: Treatment/[facility]  Treatment/[facility]   Imported By: Lester [place] 01/09/2010 11:26:19  _____________________________________________________________________  External Attachment:    Type:   Image     Comment:   External Document

## 2010-03-21 NOTE — Progress Notes (Signed)
Summary: IBS follow-up  Phone Note Outgoing Call Call back at [phone]   Summary of Call: Please call patient - let her know I need to see if dietitian can help [place] with FODMAPS diet for IBS ask her if she has had a chance to read about that and understand any of it Iva Boop MD, [facility]  January 06, 2010 1:48 PM    Follow-up for Phone Call        Left message for patient to call back Darcey Nora RN, [facility]  January 06, 2010 1:56 PM  Left message for patient to call back Darcey Nora RN, [facility]  January 09, 2010 4:24 PM  She reports she has done some reading on it, and has a little understanding of it.  She is still reading up on it.  She did register for [email or web address] for their newsletter.  She doesn't feel that according to the website she really has IBS.  She says her main complaint is bloating.  She has started a food diary.  She is doing the best she can with it. She has adjusted her diet the best as she can with it.  Doesn't seem to be helping with her symptoms. Does she need a REV Follow-up by: Darcey Nora RN, CGRN,  January 10, 2010 1:18 PM  Additional Follow-up for Phone Call Additional follow up Details #1::        remind her that IBS has a variety of manifestations and is a clinical dx - the label is not so important and we can disagree - its ok Bloating can be due to bacterial overgrowth etc I think it is sensible to try metronidazole 250 mg three times a day x 7 days to see if it helps her if she agrees REV 6 weeks or so Additional Follow-up by: Iva Boop MD, Clementeen Graham,  January 10, 2010 3:18 PM    Additional Follow-up for Phone Call Additional follow up Details #2::    Patient  aware of Dr. Marvell Fuller recommendations. Patient advised to avoid alcohol while on Flagyl. REV scheduled for 02/22/2010 at 1:30pm. Follow-up by: Darcey Nora RN, CGRN,  January 10, 2010 4:47 PM  New/Updated Medications: METRONIDAZOLE 250 MG TABS (METRONIDAZOLE) 1 by mouth three times a  day Prescriptions: METRONIDAZOLE 250 MG TABS (METRONIDAZOLE) 1 by mouth three times a day  #21 x 0   Entered by:   Darcey Nora RN, CGRN   Authorized by:   Iva Boop MD, [facility]   Signed by:   Darcey Nora RN, CGRN on 01/10/2010   Method used:   Electronically to        [facility]* (retail)       [address] [place], [place]  [address]       Ph: [phone] or [phone]       Fax: [phone]   RxID:   [id number]

## 2010-03-21 NOTE — Letter (Signed)
Summary: Ebony Scott   Imported By: Lester [place] 01/09/2010 11:17:51  _____________________________________________________________________  External Attachment:    Type:   Image     Comment:   External Document

## 2010-03-21 NOTE — Procedures (Signed)
Summary: Colonoscopy: Normal  Colonoscopy: Normal   Imported By: Francee Piccolo CMA (AAMA) 12/30/2009 09:57:17  _____________________________________________________________________  External Attachment:    Type:   Image     Comment:   External Document

## 2010-03-21 NOTE — Progress Notes (Signed)
Summary: Nuclear pre procedure  Phone Note Outgoing Call Call back at [facility] Phone [phone]   Call placed by: Rea College, CMA,  December 26, 2009 4:15 PM Call placed to: Patient Summary of Call: Reviewed information on Myoview Information Sheet (see scanned document for further details).  Arline Asp spoke with patient.      Nuclear Med Background Indications for Stress Test: Evaluation for Ischemia   History: Echo  History Comments: 12/15/09 Stress Echo:nondiagnostic, EF=75%  Symptoms: DOE    Nuclear Pre-Procedure Height (in): 62

## 2010-03-21 NOTE — Procedures (Signed)
Summary: EGD: Normal  EGD: Normal   Imported By: Francee Piccolo CMA (AAMA) 12/30/2009 09:59:29  _____________________________________________________________________  External Attachment:    Type:   Image     Comment:   External Document

## 2010-03-21 NOTE — Letter (Signed)
Summary: Med List & Allergies  Med List & Allergies   Imported By: Marylou Mccoy 12/08/2009 11:45:35  _____________________________________________________________________  External Attachment:    Type:   Image     Comment:   External Document

## 2010-03-21 NOTE — Op Note (Signed)
Summary: Colonoscopy: Dr. Matthias Hughs:                          [facility]  Patient:    Ebony Scott, Ebony Scott Visit Number: [id number] MRN: [id number]          Service Type: END Location: ENDO Attending Physician:  Rich Brave Dictated by:   Florencia Reasons, M.D. Proc. Date: 02/10/01 Admit Date:  02/10/2001   CC:         Cordelia Pen A. Rosalio Macadamia, M.D.   Operative Report  PROCEDURE:  Colonoscopy.  INDICATION:  This is a 60 year old female with intermittent rectal bleeding.  FINDINGS:  Small internal hemorrhoids.  DESCRIPTION OF PROCEDURE:  The nature, purpose, and risks of the procedure have been discussed with the patient and provided written consent.  SEDATION:  Fentanyl 100 mcg and Versed 10 mg IV prior to and during the course of the procedure without arrhythmias or desaturation.  The Olympus adjustable tension pediatric video colonoscope was advanced in a cautious fashion around the colon, taking out loops as I went, so as to conserve scope and reduce trauma to the bowel wall. Ultimately, the cecum was reached and the small intestine was entered for a distance of perhaps 15 cm after which pullback was performed. The quality of the prep was excellent so it was felt that all areas were seen.  This was a normal examination. No polyps, cancer, colitis, vascular malformations, or diverticulosis were noted. Retroflexion in the rectum was basically normal. Prominent hypertrophy to anal papillae and mild to moderate internal hemorrhoids were observed during pullout through the anal canal.  The patient tolerated the procedure well and there were no apparent complications.  IMPRESSION:  Internal hemorrhoids, otherwise normal screening colonoscopy.  PLAN:  I would consider a followup screening colonoscopy in five years, especially if the patient continues to have small volume hematochezia. Dictated by:   Florencia Reasons, M.D. Attending Physician:  Rich Brave DD:  02/10/01 TD:  02/11/01 Job: [id number]   Colonoscopy  Procedure date:  02/10/2001  Findings:      Internal hemorrhoids Performed by Dr. Matthias Hughs

## 2010-03-21 NOTE — Letter (Signed)
Summary: Rhoderick Moody   Imported By: Lester [place] 01/09/2010 11:30:33  _____________________________________________________________________  External Attachment:    Type:   Image     Comment:   External Document

## 2010-03-21 NOTE — Letter (Signed)
Summary: Ebony Scott   Imported By: Lester [place] 01/09/2010 11:24:37  _____________________________________________________________________  External Attachment:    Type:   Image     Comment:   External Document

## 2010-03-21 NOTE — Letter (Signed)
Summary: New Patient letter  [facility]  [address] [place], [place] [address]   Phone: [phone]  Fax: [phone]       11/10/2009 MRN: [id number]  Ebony Scott [address] [place], [place]  [address]  Dear Ebony Scott,  Welcome to the Gastroenterology Division at [facility].    You are scheduled to see Dr.  Stan Head on January 02, 2010 at 9:00am on the 3rd floor at [facility], [address]  We ask that you try to arrive at our office 15 minutes prior to your appointment time to allow for check-in.  We would like you to complete the enclosed self-administered evaluation form prior to your visit and bring it with you on the day of your appointment.  We will review it with you.  Also, please bring a complete list of all your medications or, if you prefer, bring the medication bottles and we will list them.  Please bring your insurance card so that we may make a copy of it.  If your insurance requires a referral to see a specialist, please bring your referral form from your primary care physician.  Co-payments are due at the time of your visit and may be paid by cash, check or credit card.     Your office visit will consist of a consult with your physician (includes a physical exam), any laboratory testing he/she may order, scheduling of any necessary diagnostic testing (e.g. x-ray, ultrasound, CT-scan), and scheduling of a procedure (e.g. Endoscopy, Colonoscopy) if required.  Please allow enough time on your schedule to allow for any/all of these possibilities.    If you cannot keep your appointment, please call [phone] to cancel or reschedule prior to your appointment date.  This allows [place] the opportunity to schedule an appointment for another patient in need of care.  If you do not cancel or reschedule by 5 p.m. the business day prior to your appointment date, you will be charged a $50.00 late cancellation/no-show fee.    Thank you for  choosing [facility] for your medical needs.  We appreciate the opportunity to care for you.  Please visit [place] at our website  to learn more about our practice.                     Sincerely,                                                             The Gastroenterology Division

## 2010-03-21 NOTE — Letter (Signed)
Summary: [facility]   Imported By: Lester [place] 01/09/2010 11:13:49  _____________________________________________________________________  External Attachment:    Type:   Image     Comment:   External Document

## 2010-03-21 NOTE — Letter (Signed)
Summary: Ebony Scott   Imported By: Lester [place] 01/09/2010 11:36:24  _____________________________________________________________________  External Attachment:    Type:   Image     Comment:   External Document

## 2010-03-21 NOTE — Letter (Signed)
Summary: Medications/Patient  Medications/Patient   Imported By: Sherian Rein 01/10/2010 07:55:52  _____________________________________________________________________  External Attachment:    Type:   Image     Comment:   External Document

## 2010-03-21 NOTE — Progress Notes (Signed)
Summary: Dyspnea not improving on qvar  Phone Note Call from Patient Call back at [phone]   Caller: Patient Call For: wright Summary of Call: Pt states she saw PW on 11/4 and was given qvar , not feeling any better, pls advise. Also wants to know if her fax was received Friday.//[facility] OKAY TO LEAVE A MSG  Initial call taken by: Darletta Moll,  December 26, 2009 2:00 PM  Follow-up for Phone Call        Fax was recieved.  It is regarding olive leaf extract being d/c'ed adn questioning why this was done b/c this is not an oil- I placed in PW lookat.  I called the pt- had to [facility]. Vernie Murders  December 26, 2009 2:58 PM  Pt called back.  She was seen on 12/23/09 and was started on qvar  40 and states that this is not helping at all.  Breathing is just the same, no better or worse.  I offered ov for tommorrow for eval, she refused stating that she just wants to know how long it typically takes to show improvement on qvar. Also she wants to know why olive leaf cap was stopped b/c it does not contain oil (this is what fax in your lookat is regarding). Pls advise thanks! Follow-up by: Vernie Murders,  December 26, 2009 3:06 PM  Additional Follow-up for Phone Call Additional follow up Details #1::        she should still stay off olive leaf extract regardless she has not been on qvar long enough schedule her with Tammy parrett to go over all meds bring all meds in a bag for review with NP needs more instruction on Qvar technique as well  tell her rast study shows severe allergies as well with high IgE levels from mold exposure.  this will not be fixed overnite   Additional Follow-up by: Storm Frisk MD,  December 26, 2009 5:22 PM    Additional Follow-up for Phone Call Additional follow up Details #2::    called spoke with patient, advised of PEW's recs as stated above.  pt verbalized her understanding.  appt scheduled with TP 11.14.11 for med calendar.  pt aware  to bring all med bottles.  pt verbalized her understanding on the lab results and the continued use of qvar. Boone Master CNA/MA  December 26, 2009 5:40 PM

## 2010-03-21 NOTE — Letter (Signed)
Summary: Phone note/[facility]  Phone note/[facility]   Imported By: Lester [place] 01/09/2010 11:20:42  _____________________________________________________________________  External Attachment:    Type:   Image     Comment:   External Document

## 2010-03-23 ENCOUNTER — Telehealth: Payer: Self-pay | Admitting: Critical Care Medicine

## 2010-03-23 NOTE — Progress Notes (Signed)
Summary: mouth soreness > MMW and f/u w/ PEW  Phone Note Call from Patient Call back at [phone]   Caller: Patient Call For: wright Summary of Call: Pt states her mouth stays sore all the time ever since she starting taking dulera on 11/4 (her initial ov) wants to know if there's anything she can take to help the soreness pls addvise.//[facility] Initial call taken by: Darletta Moll,  February 07, 2010 8:50 AM  Follow-up for Phone Call        called spoke with patient.  she c/o persistant mouth soreness since beginning dulera 12.12.11.  states flosses and brushes everyday; rinsing and brushing 2-3x daily. also c/o  prod cough with dark yellow/green mucus that she states began "loosening up" when she began the dulera.  throat becomes sore and "swollen" in the evenings - declines difficulty breathing / swallowing w/ the swelling.  PEW not in office today, will forward to doc of the day.  Allergies (verified):  1)  ! Morphine Sulfate Cr (Morphine Sulfate) 2)  ! Penicillin V Potassium (Penicillin V Potassium) 3)  ! Tetracycline Hcl (Tetracycline Hcl) 4)  ! Lortab 10 (Hydrocodone-Acetaminophen) 5)  ! Paxil (Paroxetine Hcl) 6)  ! Vioxx 7)  ! Oxycodone Hcl 8)  ! * Shrimp 9)  ! * Milk 10)  ! Levaquin 11)  ! * Cotton Seed 12)  ! * Egg 13)  ! * Strawberries 14)  ! * Soy Follow-up by: Boone Master CNA/MA,  February 07, 2010 10:31 AM  Additional Follow-up for Phone Call Additional follow up Details #1::        per SN: rec MMW #4oz, 1 tsp gargle and swallow four times a day as needed refill x5.  have her f/u w/ PEW. Boone Master CNA/MA  February 07, 2010 12:13 PM   next appt w/ PEW scheduled for 1.9.12.  called spoke with patient, advised of SN's recs as stated above.  rx sent to pt's verified pharmacy.  pt asked if she may take the plain mucinex or if she should try one with a decongestant in it.  advised pt that decongestants can raise BP, and pt stated that her BP was elevated  at last ov.  pt stated she will continue with the plain mucinex.  i did advise pt that she may use delsym otc for her cough.  pt okay with this.  she will keep her 1.9.12 appt w/ PEW and call sooner if needed.  pt also asked if she could call back if it will be more cost effective for her to have a larger quantity of the MMW as opposed to the 4oz. Boone Master CNA/MA  February 07, 2010 12:25 PM     New/Updated Medications: FIRST-DUKES MOUTHWASH  SUSP (DIPHENHYD-HYDROCORT-NYSTATIN) gargle and swallow 1 teaspoon four times a day as needed Prescriptions: FIRST-DUKES MOUTHWASH  SUSP (DIPHENHYD-HYDROCORT-NYSTATIN) gargle and swallow 1 teaspoon four times a day as needed  #4oz x 5   Entered by:   Boone Master CNA/MA   Authorized by:   Storm Frisk MD   Signed by:   Boone Master CNA/MA on 02/07/2010   Method used:   Electronically to        [facility]* (retail)       [address] [place], [place]  [address]       Ph: [phone] or [phone]  Fax: [phone]   RxID:   [id number]   Appended Document: mouth soreness > MMW and f/u w/ PEW Patient said pharmacy requests that she be prescribed one month supply of Magic Mouthwash.    Appended Document: mouth soreness > MMW and f/u w/ PEW also obtain an aerochamber for the pt if not one already obtained  Appended Document: mouth soreness > MMW and f/u w/ PEW Called and spoke with pt.  She states already has an aerochamber and is using this.  I also called the pharmacy and advised that they should just give her 1 month supply- qs to help save on copays. Pt aware this was done.

## 2010-03-23 NOTE — Progress Notes (Signed)
Summary: ?s for PW-LMOMTCB x 1  Phone Note Call from Patient   Caller: Patient Call For: wright Summary of Call: pt is taking prednisone and dulera (among other meds). ankles were swollen last night but better today. her skin, however is really dry now and nails are breaking/ splitting. can meds be the cause of this? pt says she is also experiencing short term memory. cell [id number]. note: pt also wants to know if dr Delford Field thinks it's ok for her to get a dog.  Initial call taken by: Tivis Ringer, CNA,  January 30, 2010 9:31 AM  Follow-up for Phone Call        Called, spoke with pt.  She has several questions for PW: 1.  Since starting dulera she is shaking "terrible" for several hours after each dose.  Would like to know if this should improve over time. 2.  Still coughing up mucus.  The amount of mucus is unchanged but it does have more yellow in it now.  Would like to know if she needs abx for this. 3.  Still having "a lot of SOB," very dry skin, and nails are breaking more.  ? if this could be side effect from dulera and prednisone. 4.  Swollen ankles last night.  Swelling better this am.  Would like to know if this is normal for her condition bc she has "never had this happen before." 5.  Thinking about getting an inside dog.  ? if PW thinks this will be ok or not at this time.    Informed PW is out of the office until tomorrow evening but will forward message to him to address.  Pt ok with this and has an appt with PCP tomorrow and will also discuss this with him as well. Follow-up by: Gweneth Dimitri RN,  January 30, 2010 11:41 AM  Additional Follow-up for Phone Call Additional follow up Details #1::        1. Swelling is from prednisone, should resolve as dose is reduced  2. cough is from dulera,  and pred.  we want this, it helps get mucus out.  No ABX indicated  3. Dry skin and nail issues are unrelated to med or condition.  speak to PCP re that issue  4. shaking is side  effect of dulera.  if persists reduce dulera to one puff two times a day  Additional Follow-up by: Storm Frisk MD,  January 30, 2010 11:52 AM    Additional Follow-up for Phone Call Additional follow up Details #2::    Called, spoke with pt.  She was informed of above per PW.  She verbalized understanding.  States she will reduce dulera to one puff two times a day today.  Will call office back if sxs do not improve.  She also would like to know PW's recs on getting the inside dog and forgot to mention earilier that she is having short term memory loss.  Onset late Sept - Oct and has gotten worse.  Advised she should speak with PCP at tomorrow's appt re: memory loss.  She verbalized understanding.    Dr. Delford Field, pls advise on your thoughts about pt getting a dog.  Thanks!  do NOT advise pt getting a dog just yet  PW Follow-up by: Gweneth Dimitri RN,  January 30, 2010 12:02 PM  Additional Follow-up for Phone Call Additional follow up Details #3:: Details for Additional Follow-up Action Taken: [facility]  January 31, 2010 1:54 PM  Spoke with pt and notified to not get a dog now per PW.  Pt verbalized understanding. Additional Follow-up by: Vernie Murders,  January 31, 2010 4:35 PM  New/Updated Medications: DULERA 200-5 MCG/ACT AERO (MOMETASONE FURO-FORMOTEROL FUM) 1 puff twice per day

## 2010-03-23 NOTE — Op Note (Signed)
Summary: Velora Heckler MD  Velora Heckler MD   Imported By: Lester [place] 02/08/2010 10:05:25  _____________________________________________________________________  External Attachment:    Type:   Image     Comment:   External Document

## 2010-03-23 NOTE — Letter (Signed)
Summary: Food Diary/Patient  Food Diary/Patient   Imported By: Sherian Rein 03/06/2010 07:44:49  _____________________________________________________________________  External Attachment:    Type:   Image     Comment:   External Document

## 2010-03-23 NOTE — Progress Notes (Signed)
Summary: laryngitis > stop dulera x7days and ov  Phone Note Call from Patient Call back at [facility] Phone [phone]   Caller: Patient Call For: Dr. Delford Field Summary of Call: [name] phoned and stated that she has had laryngitis for 3 days. She isn't sure if it is a side effect from her [employer]. Patient can be reached on her cell  [phone] Initial call taken by: Vedia Coffer,  February 21, 2010 9:23 AM  Follow-up for Phone Call        Spoke with pt and she is c/o laryngitis x 3 days. She was given MMW on 02-07-10 for sores inhermouth. THe sores have gotten better. She states she rinses, brushes and gargles after each dose of dulera. Pt does has some MMW at home. Pt staets he feels fine otherwise. She wants to know could this be due to dulera.   Please advise. Carron Curie CMA  February 21, 2010 10:52 AM   Additional Follow-up for Phone Call Additional follow up Details #1::        this could be due to dulera stop dulera for 7days will need ov in jan to regroup Additional Follow-up by: Storm Frisk MD,  February 21, 2010 1:19 PM    Additional Follow-up for Phone Call Additional follow up Details #2::    called spoke with patient, advised of PEW's recs as stated above.  pt verbalized her understanding.  pt has upcoming appt w/ PEW on 1.9.12 and she requests to keep this appt rather than rsc for a later date this month. Boone Master CNA/MA  February 21, 2010 2:13 PM

## 2010-03-23 NOTE — Assessment & Plan Note (Addendum)
Summary: Pulmonary OV   Copy to:  Dr. Lynnea Ferrier Primary Provider/Referring Provider:  Dr. Candie Echevaria  CC:  Follow up to regroup.  Marland Kitchen  History of Present Illness:  Initial Pulmonary Consultation 12/23/09   12/23/09-     This is a 60 year old female who presents with dyspnea.  The patient complains of shortness of breath,   The patient reports a history of allergic rhinitis and cancer:.  The pt is dyspneic if does any activity There is a productive cough,  was present in early sept.  rx abx and pred and stopped the cough The pred  helped some but still heavy in chest There is hx of [place] exposure [place] 2/ to 04/2009.  felt more fatigued  8hr shift.  There was  wetness in the basement.  pipe backup  09/2009 in [facility] [place] The pt has been on symbicort for 3 weeks without much improvement. >>symbicort changed to QVAR, CT sinus neg. RAST /IGE, Hypersensitivty/fungal panel pending  01/02/10--Presents for follow up and med review. Pt presents today with >30 med w/ rx and otc/herbal products. Was seen by GI the am w/ recs to hold  all herbal products that have not been recommended by medical providers. We reviewed all her meds and organized them into a med calendar. She has seen no change in her breathing since last ov. Denies chest pain,  orthopnea, hemoptysis, fever, n/v/d, edema, headache. She has an extensive list of herbals, we left on the ones rec by her MD and held all other one  January 23, 2010 10:50 AM At the last OV was pt initial consult 12/23/09 then pt went to NP for med calendar. Now cough is less.  Cough is not as often.  Had skin testing at [facility] 65yrs : reactions to mold, dust, gress , trees, cat dander,  various food allergies.  Has had allergy immunotherapy for years and helped then stopped.   Dyspnea is unchanged. cannot vacuum a room without dyspnea. If dusts wears a mask and is no different.February 27, 2010 11:45 AM Pt still with nose bleeds and gets out clots and  is hoarse Pt is worse since stopped dulera Pt was improving on dulera first of jan.   Pt notes pfr 200s  then increased to close to 300 while on dulera but not able to tolerate dulera.  did reduce dose to 1 puff two times a day on dulera wiht less jitteriness.  March 09, 2010 2:29 PM Still dyspneic,  used qvar last night.  was ok on dulera. symbicort>>qvar>>>dulera>>qvar no time off  home assessment: no mold found.  windex issue,  liquids or wipes better vs powders ,  mask if dust and vacuums,  hepafilters on vacuums, washed with soap and water  vacuum,  mattress issues  qvar does not cause as much throat irritation  as dulera   Current Medications (verified): 1)  Allegra-D Allergy & Congestion 60-120 Mg Xr12h-Tab (Fexofenadine-Pseudoephedrine) .... Once Daily 2)  Vinate Ultra  Tabs (Prenatal Vit-Dss-Fe Cbn-Fa) .... Take 1 Tablet By Mouth Once A Day 3)  Mucinex 600 Mg Xr12h-Tab (Guaifenesin) .... Take 1 Tablet By Mouth Two Times A Day 4)  Citracal Maximum 315-250 Mg-Unit Tabs (Calcium Citrate-Vitamin D) .... Take 1 Tablet By Mouth Two Times A Day 5)  Vitamin D 2000 Unit Tabs (Cholecalciferol) .... 1/2 Tab By Mouth At Bedtime 6)  Melatonin 3 Mg Tabs (Melatonin) .... 3 Tabs By Mouth At Bedtime 7)  Propylthiouracil  50 Mg Tabs (Propylthiouracil) .... Take 1 Tablet By Mouth Two Times A Day 8)  Crestor 20 Mg Tabs (Rosuvastatin Calcium) .... 1/2 Tab By Mouth At Bedtime 9)  Alpha-Lipoic Acid 600 Mg Caps (Alpha-Lipoic Acid) .... Once Daily 10)  Gabapentin 600 Mg Tabs (Gabapentin) .... Take 1 Tablet By Mouth Once A Day 11)  Align  Caps (Probiotic Product) .... Once Daily 12)  Tylenol Extra Strength 500 Mg Tabs (Acetaminophen) .... 2 Tabs By Mouth Two Times A Day 13)  Systane Ultra 0.4-0.3 % Soln (Polyethyl Glycol-Propyl Glycol) .Marland Kitchen.. 1-2 Drops Each Eye  Every Morning and At Bedtime 14)  Lidoderm 5 % Ptch (Lidocaine) .... Apply Patch in The Morning and Remove At Bedtime As Needed 15)  Lactaid 3000  Unit Tabs (Lactase) .... Take 3-4 Tabs With Dairy Liquids and Solids 16)  Nystatin-Triamcinolone 100000-0.1 Unit/gm-% Crea (Nystatin-Triamcinolone) .... Apply As Needed 17)  Ayr Saline Nasal  Gel (Saline) .... At Bedtime 18)  Beano  Tabs (Alpha-D-Galactosidase) .... Per Box 19)  Gas-X Extra Strength 125 Mg Caps (Simethicone) .... Per Box 20)  Proctozone-Hc 2.5 % Crea (Hydrocortisone) .... As Needed 21)  Clotrimazole 1 % Soln (Clotrimazole) .... Two Times A Day 22)  First-Dukes Mouthwash  Susp (Diphenhyd-Hydrocort-Nystatin) .... Gargle and Swallow 1 Teaspoon Four Times A Day As Needed 23)  Refresh P.m.  Oint (Artificial Tear Ointment) .... Apply Small Amount Under Eye Lids At Bedtime 24)  Triple Antibiotic [id number] Oint (Neomycin-Bacitracin-Polymyxin) .... Apply in Eash Nostril At Bedtime 25)  Cool Mist Humidifier  Misc (Humidifiers) .... At Bedtime 26)  Creon 24000 Unit Cpep (Pancrelipase (Lip-Prot-Amyl)) .... Once With Each Mea  Allergies (verified): 1)  ! Morphine Sulfate Cr (Morphine Sulfate) 2)  ! Penicillin V Potassium (Penicillin V Potassium) 3)  ! Tetracycline Hcl (Tetracycline Hcl) 4)  ! Lortab 10 (Hydrocodone-Acetaminophen) 5)  ! Paxil (Paroxetine Hcl) 6)  ! Vioxx 7)  ! Oxycodone Hcl 8)  ! * Shrimp 9)  ! * Milk 10)  ! Levaquin 11)  ! * Cotton Seed 12)  ! * Dust 13)  ! * Molds 14)  ! * Grasses 15)  ! * Trees 16)  ! * Weeds 17)  ! * Egg 18)  ! * Strawberries 19)  ! * Soy  Past History:  Past medical, surgical, family and social histories (including risk factors) reviewed, and no changes noted (except as noted below).  Past Medical History: Reviewed history from 02/22/2010 and no changes required. chronic neck pain Allergic rhinitis moderate persistent asthma    -FeV1 72% 2011    -IgE 102 2011    -CT sinus Neg 2011 Breast cancer GERD Hypothyroidism Osteoporosis Hiatal Hernia Diverticulosis Hemorrhoids COMPLEX MED REGIMEN-Meds reviewed with pt education and  computerized med calendar completed 01/02/10 asthma  IBS  Past Surgical History: Reviewed history from 12/30/2009 and no changes required. discectomy  C3-C4  2003 discectomy  C6-T  09/03 Hysterectomy  1988 Tonsillectomy, adenoidectomy as child deviated septum  1980's ant post repair  1990's Lumpectomy  1998 L breast Cholecystectomy R. Elbow surgery L. Knee surgery R. Hand surgery  Family History: Reviewed history from 01/02/2010 and no changes required. allergies - mother, father heart disease - mother, father arthritis - mother, father mother deceased from lung ca Family History of Colon Cancer: Maternal Half Uncle, and Half Maternal Aunt  Family History of Colitis: Daughter   Social History: Reviewed history from 02/22/2010 and no changes required. Single, 2 biological, 1 step son Disabled, retired Nurse, mental health  Dental Hygentist 1 glasses of wine at bedtime twice a week Patient has never smoked.  Daily Caffeine Use: 1 per day  Review of Systems       The patient complains of shortness of breath with activity, productive cough, and non-productive cough.  The patient denies shortness of breath at rest, coughing up blood, chest pain, irregular heartbeats, acid heartburn, indigestion, loss of appetite, weight change, abdominal pain, difficulty swallowing, sore throat, tooth/dental problems, headaches, nasal congestion/difficulty breathing through nose, sneezing, itching, ear ache, anxiety, depression, hand/feet swelling, joint stiffness or pain, rash, change in color of mucus, and fever.    Vital Signs:  Patient profile:   60 year old female Height:      61 inches Weight:      110 pounds BMI:     20.86 O2 Sat:      100 % on Room air Temp:     97.7 degrees F oral Pulse rate:   75 / minute BP sitting:   140 / 72  (right arm) Cuff size:   regular  Vitals Entered By: Gweneth Dimitri RN (March 09, 2010 2:13 PM)  O2 Flow:  Room air CC: Follow up to regroup.   Comments  Medications reviewed with patient Daytime contact number verified with patient. Gweneth Dimitri RN  March 09, 2010 2:13 PM    Physical Exam  Additional Exam:  Gen: Pleasant, well-nourished, in no distress,  normal affect ENT: ulcerations on ant nasal septum bilateral ,   mouth clear,  oropharynx clear, no postnasal drip Neck: No JVD, no TMG, no carotid bruits Lungs: No use of accessory muscles, no dullness to percussion, distant BS Cardiovascular: RRR, heart sounds normal, no murmur or gallops, no peripheral edema Abdomen: soft and NT, no HSM,  BS normal Musculoskeletal: No deformities, no cyanosis or clubbing Neuro: alert, non focal Skin: Warm, no lesions or rashes    Impression & Recommendations:  Problem # 1:  EXTRINSIC ASTHMA, UNSPECIFIED (ICD-493.00) Assessment Deteriorated  moderate persistent asthma with good response to dulera, but side effects of upper airway irritation even with spacer, qvar also caused hoarseness and not as effective as dulera plan resume dulera one puff daily home environ assessment revealed dust issues, no mold start xolair wiht high IgE levels and mult pos allergies   Medications Added to Medication List This Visit: 1)  Ayr Saline Nasal Gel (Saline) .... At bedtime 2)  Triple Antibiotic [id number] Oint (Neomycin-bacitracin-polymyxin) .... Apply in eash nostril at bedtime 3)  Cool Mist Humidifier Misc (Humidifiers) .... At bedtime 4)  Creon 24000 Unit Cpep (Pancrelipase (lip-prot-amyl)) .... Once with each mea 5)  Dulera 200-5 Mcg/act Aero (Mometasone furo-formoterol fum) .... One puff daily  Complete Medication List: 1)  Allegra-d Allergy & Congestion 60-120 Mg Xr12h-tab (Fexofenadine-pseudoephedrine) .... Once daily 2)  Vinate Ultra Tabs (Prenatal vit-dss-fe cbn-fa) .... Take 1 tablet by mouth once a day 3)  Mucinex 600 Mg Xr12h-tab (Guaifenesin) .... Take 1 tablet by mouth two times a day 4)  Citracal Maximum 315-250 Mg-unit Tabs (Calcium  citrate-vitamin d) .... Take 1 tablet by mouth two times a day 5)  Vitamin D 2000 Unit Tabs (Cholecalciferol) .... 1/2 tab by mouth at bedtime 6)  Melatonin 3 Mg Tabs (Melatonin) .... 3 tabs by mouth at bedtime 7)  Propylthiouracil 50 Mg Tabs (Propylthiouracil) .... Take 1 tablet by mouth two times a day 8)  Crestor 20 Mg Tabs (Rosuvastatin calcium) .... 1/2 tab by mouth at bedtime 9)  Alpha-lipoic Acid 600  Mg Caps (Alpha-lipoic acid) .... Once daily 10)  Gabapentin 600 Mg Tabs (Gabapentin) .... Take 1 tablet by mouth once a day 11)  Align Caps (Probiotic product) .... Once daily 12)  Tylenol Extra Strength 500 Mg Tabs (Acetaminophen) .... 2 tabs by mouth two times a day 13)  Systane Ultra 0.4-0.3 % Soln (Polyethyl glycol-propyl glycol) .Marland Kitchen.. 1-2 drops each eye  every morning and at bedtime 14)  Lidoderm 5 % Ptch (Lidocaine) .... Apply patch in the morning and remove at bedtime as needed 15)  Lactaid 3000 Unit Tabs (Lactase) .... Take 3-4 tabs with dairy liquids and solids 16)  Nystatin-triamcinolone 100000-0.1 Unit/gm-% Crea (Nystatin-triamcinolone) .... Apply as needed 17)  Ayr Saline Nasal Gel (Saline) .... At bedtime 18)  Beano Tabs (Alpha-d-galactosidase) .... Per box 19)  Gas-x Extra Strength 125 Mg Caps (Simethicone) .... Per box 20)  Proctozone-hc 2.5 % Crea (Hydrocortisone) .... As needed 21)  Clotrimazole 1 % Soln (Clotrimazole) .... Two times a day 22)  First-dukes Mouthwash Susp (Diphenhyd-hydrocort-nystatin) .... Gargle and swallow 1 teaspoon four times a day as needed 23)  Refresh P.m. Oint (Artificial tear ointment) .... Apply small amount under eye lids at bedtime 24)  Triple Antibiotic [id number] Oint (Neomycin-bacitracin-polymyxin) .... Apply in eash nostril at bedtime 25)  Cool Mist Humidifier Misc (Humidifiers) .... At bedtime 26)  Creon 24000 Unit Cpep (Pancrelipase (lip-prot-amyl)) .... Once with each mea 27)  Dulera 200-5 Mcg/act Aero (Mometasone furo-formoterol fum) ....  One puff daily  Other Orders: Est. Patient Level V (16109) Misc. Referral (Misc. Ref)  Patient Instructions: 1)  Start Dulera one puff daily 2)  Stop Qvar 3)  Change allegra D to plain allegra 4)  Use mucinex D as needed  5)  Xolair injections will be ordered 6)  Return two months  Appended Document: Pulmonary OV fax Candie Echevaria

## 2010-03-23 NOTE — Progress Notes (Signed)
Summary: ? recommendations for asthma  Phone Note Call from Patient Call back at [phone]   Caller: Patient Call For: wright Reason for Call: Talk to Nurse Summary of Call: Home environmental evaluation with Minerva Fester w/ [facility] was done and showed no signs of mold.  Patient calling to ask what next step would be for asthma?  Does she need to start xolair? Initial call taken by: Lehman Prom,  March 07, 2010 9:30 AM  Follow-up for Phone Call        Spoke withpt and she staes [facility] came out yesterday and did home evaluation and did not find any mold. She states there recs were to wear a mask when she dusts and vacuums.  Pt has an appt on 04-14-10 as a 6 week follow-up, but she wants to know what to do in the meantime.  She also states that she has not seen any difference on the qvar. She states she still has hoarseness and has trouble "catching her breath." with the qvar. Pt states seh is frustrated because she is not getting better. Please advsie does  pt need a sooner appt then 2-24 to review? Carron Curie CMA  March 07, 2010 11:59 AM  Pt called again says she forgot to mention that on ov 1/9 Dr. Delford Field noticed something on the left side of her throat and she can't remember what he said it was, also states that her left ear has been hurting for the last few days pls advise.   Additional Follow-up for Phone Call Additional follow up Details #1::        lets bring pt in to regroup and so I can recheck ears/throat have her stop qvar for now she may benefit from [place].  will discuss at OV      Additional Follow-up for Phone Call Additional follow up Details #2::    Called, spoke with pt.  She was informed of above and verbalized understanding.  ROV scheduled for tomorrow at 2:30pm in [place] with PW to regroup -- pt aware.  Gweneth Dimitri RN  March 08, 2010 11:19 AM

## 2010-03-23 NOTE — Assessment & Plan Note (Signed)
Summary: Pulmonary OV   Copy to:  Dr. Lynnea Ferrier Primary Provider/Referring Provider:  Dr. Candie Echevaria  CC:  1 month follow up.  hoarseness and heaviness in chest x 3-4 days.  Prod cough with dark yellow mucus.  nose bleeds x several wks..  History of Present Illness:  Initial Pulmonary Consultation 12/23/09   12/23/09-     This is a 60 year old female who presents with dyspnea.  The patient complains of shortness of breath,   The patient reports a history of allergic rhinitis and cancer:.  The pt is dyspneic if does any activity There is a productive cough,  was present in early sept.  rx abx and pred and stopped the cough The pred  helped some but still heavy in chest There is hx [place].  felt more fatigued  8hr shift.  There was  wetness in the basement.  pipe backup  09[place]2009 [place] The pt has been on symbicort for 3 weeks without much improvement. >>symbicort changed to QVAR, CT sinus neg. RAST /IGE, Hypersensitivty/fungal panel pending  01/02/10--Presents for follow up and med review. Pt presents today with >30 med w/ rx and otc/herbal products. Was seen by GI the am w/ recs to hold  all herbal products that have not been recommended by medical providers. We reviewed all her meds and organized them into a med calendar. She has seen no change in her breathing since last ov. Denies chest pain,  orthopnea, hemoptysis, fever, n/v/d, edema, headache. She has an extensive list of herbals, we left on the ones rec by her MD and held all other one  January 23, 2010 10:50 AM At the last OV was pt initial consult 12/23/09 then pt went to NP for med calendar. Now cough is less.  Cough is not as often.  Had skin testing at [facility] 78yrs : reactions to mold, dust, gress , trees, cat dander,  various food allergies.  Has had allergy immunotherapy for years and helped then stopped.   Dyspnea is unchanged. cannot vacuum a room without dyspnea. If dusts  wears a mask and is no different.February 27, 2010 11:45 AM Pt still with nose bleeds and gets out clots and is hoarse Pt is worse since stopped dulera Pt was improving on dulera first of jan.   Pt notes pfr 200s  then increased to close to 300 while on dulera but not able to tolerate dulera.  did reduce dose to 1 puff two times a day on dulera wiht less jitteriness.    Preventive Screening-Counseling & Management  Alcohol-Tobacco     Smoking Status: never     Passive Smoke Exposure: yes  Current Medications (verified): 1)  Nasonex 50 Mcg/act  Susp (Mometasone Furoate) .... 2 Sprays Each Nostril  Every Morning 2)  Allegra-D Allergy & Congestion 60-120 Mg Xr12h-Tab (Fexofenadine-Pseudoephedrine) .... Once Daily 3)  Vinate Ultra  Tabs (Prenatal Vit-Dss-Fe Cbn-Fa) .... Take 1 Tablet By Mouth Once A Day 4)  Mucinex 600 Mg Xr12h-Tab (Guaifenesin) .... Take 1 Tablet By Mouth Two Times A Day 5)  Citracal Maximum 315-250 Mg-Unit Tabs (Calcium Citrate-Vitamin D) .... Take 1 Tablet By Mouth Two Times A Day 6)  Vitamin D 2000 Unit Tabs (Cholecalciferol) .... 1/2 Tab By Mouth At Bedtime 7)  Melatonin 3 Mg Tabs (Melatonin) .... 3 Tabs By Mouth At Bedtime 8)  Propylthiouracil 50 Mg Tabs (Propylthiouracil) .... Take 1 Tablet By Mouth Two  Times A Day 9)  Crestor 20 Mg Tabs (Rosuvastatin Calcium) .... 1/2 Tab By Mouth At Bedtime 10)  Alpha-Lipoic Acid 600 Mg Caps (Alpha-Lipoic Acid) .... Once Daily 11)  Gabapentin 600 Mg Tabs (Gabapentin) .... Take 1 Tablet By Mouth Once A Day 12)  Align  Caps (Probiotic Product) .... Once Daily 13)  Tylenol Extra Strength 500 Mg Tabs (Acetaminophen) .... 2 Tabs By Mouth Two Times A Day 14)  Systane Ultra 0.4-0.3 % Soln (Polyethyl Glycol-Propyl Glycol) .Marland Kitchen.. 1-2 Drops Each Eye  Every Morning and At Bedtime 15)  Lidoderm 5 % Ptch (Lidocaine) .... Apply Patch in The Morning and Remove At Bedtime As Needed 16)  Lactaid 3000 Unit Tabs (Lactase) .... Take 3-4 Tabs With Dairy  Liquids and Solids 17)  Astelin 137 Mcg/spray  Soln (Azelastine Hcl) .... 2 Puffs Each Nostril Two Times A Day As Needed 18)  Nystatin-Triamcinolone 100000-0.1 Unit/gm-% Crea (Nystatin-Triamcinolone) .... Apply As Needed 19)  Ayr Saline Nasal  Gel (Saline) .... Apply As Needed 20)  Beano  Tabs (Alpha-D-Galactosidase) .... Per Box 21)  Gas-X Extra Strength 125 Mg Caps (Simethicone) .... Per Box 22)  Proctozone-Hc 2.5 % Crea (Hydrocortisone) .... As Needed 23)  Clotrimazole 1 % Soln (Clotrimazole) .... Two Times A Day 24)  First-Dukes Mouthwash  Susp (Diphenhyd-Hydrocort-Nystatin) .... Gargle and Swallow 1 Teaspoon Four Times A Day As Needed 25)  Refresh P.m.  Oint (Artificial Tear Ointment) .... Apply Small Amount Under Eye Lids At Bedtime  Allergies (verified): 1)  ! Morphine Sulfate Cr (Morphine Sulfate) 2)  ! Penicillin V Potassium (Penicillin V Potassium) 3)  ! Tetracycline Hcl (Tetracycline Hcl) 4)  ! Lortab 10 (Hydrocodone-Acetaminophen) 5)  ! Paxil (Paroxetine Hcl) 6)  ! Vioxx 7)  ! Oxycodone Hcl 8)  ! * Shrimp 9)  ! * Milk 10)  ! Levaquin 11)  ! * Cotton Seed 12)  ! * Dust 13)  ! * Molds 14)  ! * Grasses 15)  ! * Trees 16)  ! * Weeds 17)  ! * Egg 18)  ! * Strawberries 19)  ! * Soy  Past History:  Past medical, surgical, family and social histories (including risk factors) reviewed, and no changes noted (except as noted below).  Past Medical History: Reviewed history from 02/22/2010 and no changes required. chronic neck pain Allergic rhinitis moderate persistent asthma    -FeV1 72% 2011    -IgE 102 2011    -CT sinus Neg 2011 Breast cancer GERD Hypothyroidism Osteoporosis Hiatal Hernia Diverticulosis Hemorrhoids COMPLEX MED REGIMEN-Meds reviewed with pt education and computerized med calendar completed 01/02/10 asthma  IBS  Past Surgical History: Reviewed history from 12/30/2009 and no changes required. discectomy  C3-C4  2003 discectomy  C6-T   09/03 Hysterectomy  1988 Tonsillectomy, adenoidectomy as child deviated septum  1980's ant post repair  1990's Lumpectomy  1998 L breast Cholecystectomy R. Elbow surgery L. Knee surgery R. Hand surgery  Family History: Reviewed history from 01/02/2010 and no changes required. allergies - mother, father heart disease - mother, father arthritis - mother, father mother deceased from lung ca Family History of Colon Cancer: Maternal Half Uncle, and Half Maternal Aunt  Family History of Colitis: Daughter   Social History: Reviewed history from 02/22/2010 and no changes required. 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  Daily Caffeine Use: 1 per day  Review of Systems  The patient complains of shortness of breath with activity and non-productive cough.  The patient denies shortness of breath at rest, productive cough, coughing up blood, chest pain, irregular heartbeats, acid heartburn, indigestion, loss of appetite, weight change, abdominal pain, difficulty swallowing, sore throat, tooth/dental problems, headaches, nasal congestion/difficulty breathing through nose, sneezing, itching, ear ache, anxiety, depression, hand/feet swelling, joint stiffness or pain, rash, change in color of mucus, and fever.    Vital Signs:  Patient profile:   60 year old female Height:      61 inches Weight:      112 pounds BMI:     21.24 O2 Sat:      98 % on Room air Temp:     97.7 degrees F oral Pulse rate:   76 / minute BP sitting:   116 / 76  (right arm) Cuff size:   regular  Vitals Entered By: Gweneth Dimitri RN (February 27, 2010 11:41 AM)  O2 Flow:  Room air CC: 1 month follow up.  hoarseness and heaviness in chest x 3-4 days.  Prod cough with dark yellow mucus.  nose bleeds x several wks.  Does patient need assistance? Functional Status Social activities Comments Medications  reviewed with patient Daytime contact number verified with patient. Gweneth Dimitri RN  February 27, 2010 11:41 AM    Physical Exam  Additional Exam:  Gen: Pleasant, well-nourished, in no distress,  normal affect ENT: ulcerations on ant nasal septum bilateral ,   mouth clear,  oropharynx clear, no postnasal drip Neck: No JVD, no TMG, no carotid bruits Lungs: No use of accessory muscles, no dullness to percussion, distant BS Cardiovascular: RRR, heart sounds normal, no murmur or gallops, no peripheral edema Abdomen: soft and NT, no HSM,  BS normal Musculoskeletal: No deformities, no cyanosis or clubbing Neuro: alert, non focal Skin: Warm, no lesions or rashes    Impression & Recommendations:  Problem # 1:  EXTRINSIC ASTHMA, UNSPECIFIED (ICD-493.00) Assessment Unchanged moderate persistent asthma with good response to dulera, but side effects of upper airway irritation even with spacer plan  trial on qvar again and stop dulera  stop nasonex with nasal irritation home environmental study for mold check  Medications Added to Medication List This Visit: 1)  Qvar 40 Mcg/act Aers (Beclomethasone dipropionate) .... Two puffs twice daily use spacer  Other Orders: Est. Patient Level V (04540) DME Referral (DME)  Patient Instructions: 1)  A home environmental evaluation with Minerva Fester of [facility] will be obtained 2)  Stop Dulera 3)  Try Qvar one  puff twice daily  use sample 4)  Stop nasonex 5)  Continue to use saline salt based nasal gel at least twice daily 6)  Return 6 weeks  Appended Document: Pulmonary OV fax Candie Echevaria

## 2010-03-23 NOTE — Assessment & Plan Note (Signed)
Summary: FU/IBS/cb   History of Present Illness Visit Type: Follow-up Visit Primary GI MD: Stan Head MD [facility] Primary Provider: Dr. Candie Echevaria Requesting Provider: na Chief Complaint: Follow up IBS History of Present Illness:   Patient states that she is doing much better and she has been keeping a diary of her foods since November. Patient has a list of questions.  She believes that the metronidazole made things better.  She had been on Preavcid for years due to a diagnosis of acid reflux but stopped that over a year ago and has not had heartburn or signifant indigestion.       GI Review of Systems      Denies abdominal pain, acid reflux, belching, bloating, chest pain, dysphagia with liquids, dysphagia with solids, heartburn, loss of appetite, nausea, vomiting, vomiting blood, weight loss, and  weight gain.        Denies anal fissure, black tarry stools, change in bowel habit, constipation, diarrhea, diverticulosis, fecal incontinence, heme positive stool, hemorrhoids, irritable bowel syndrome, jaundice, light color stool, liver problems, rectal bleeding, and  rectal pain.    Current Medications (verified): 1)  Dulera 200-5 Mcg/act Aero (Mometasone Furo-Formoterol Fum) .Marland Kitchen.. 1 Puff Twice Per Day (Holding) 2)  Nasonex 50 Mcg/act  Susp (Mometasone Furoate) .... 2 Sprays Each Nostril  Every Morning 3)  Allegra-D Allergy & Congestion 60-120 Mg Xr12h-Tab (Fexofenadine-Pseudoephedrine) .... Once Daily 4)  Vinate Ultra  Tabs (Prenatal Vit-Dss-Fe Cbn-Fa) .... Take 1 Tablet By Mouth Once A Day 5)  Mucinex 600 Mg Xr12h-Tab (Guaifenesin) .... Take 1 Tablet By Mouth Two Times A Day 6)  Citracal Maximum 315-250 Mg-Unit Tabs (Calcium Citrate-Vitamin D) .... Take 1 Tablet By Mouth Two Times A Day 7)  Vitamin D 2000 Unit Tabs (Cholecalciferol) .... 1/2 Tab By Mouth At Bedtime 8)  Melatonin 3 Mg Tabs (Melatonin) .... 3 Tabs By Mouth At Bedtime 9)  Propylthiouracil 50 Mg Tabs  (Propylthiouracil) .... Take 1 Tablet By Mouth Two Times A Day 10)  Crestor 20 Mg Tabs (Rosuvastatin Calcium) .... 1/2 Tab By Mouth At Bedtime 11)  Alpha-Lipoic Acid 600 Mg Caps (Alpha-Lipoic Acid) .... Once Daily 12)  Gabapentin 600 Mg Tabs (Gabapentin) .... Take 1 Tablet By Mouth Once A Day 13)  Creon 12000 Unit Cpep (Pancrelipase (Lip-Prot-Amyl)) .... Take 1 Capsule By Mouth Three Times A Day 14)  Align  Caps (Probiotic Product) .... Once Daily 15)  Tylenol Extra Strength 500 Mg Tabs (Acetaminophen) .... 2 Tabs By Mouth Two Times A Day 16)  Systane Ultra 0.4-0.3 % Soln (Polyethyl Glycol-Propyl Glycol) .Marland Kitchen.. 1-2 Drops Each Eye  Every Morning and At Bedtime 17)  Lidoderm 5 % Ptch (Lidocaine) .... Apply Patch in The Morning and Remove At Bedtime As Needed 18)  Lactaid 3000 Unit Tabs (Lactase) .... Take 3-4 Tabs With Dairy Liquids and Solids 19)  Astelin 137 Mcg/spray  Soln (Azelastine Hcl) .... 2 Puffs Each Nostril Two Times A Day As Needed 20)  Nystatin-Triamcinolone 100000-0.1 Unit/gm-% Crea (Nystatin-Triamcinolone) .... Apply As Needed 21)  Ayr Saline Nasal  Gel (Saline) .... Apply As Needed 22)  Beano  Tabs (Alpha-D-Galactosidase) .... Per Box 23)  Gas-X Extra Strength 125 Mg Caps (Simethicone) .... Per Box 24)  Proctozone-Hc 2.5 % Crea (Hydrocortisone) .... As Needed 25)  Aerochamber Mv  Misc (Spacer/aero-Holding Chambers) .... Use With Qvar 26)  Clotrimazole 1 % Soln (Clotrimazole) .... Two Times A Day 27)  First-Dukes Mouthwash  Susp (Diphenhyd-Hydrocort-Nystatin) .... Gargle and Swallow 1 Teaspoon Four Times  A Day As Needed 28)  Refresh P.m.  Oint (Artificial Tear Ointment) .... Apply Small Amount Under Eye Lids At Bedtime  Allergies (verified): 1)  ! Morphine Sulfate Cr (Morphine Sulfate) 2)  ! Penicillin V Potassium (Penicillin V Potassium) 3)  ! Tetracycline Hcl (Tetracycline Hcl) 4)  ! Lortab 10 (Hydrocodone-Acetaminophen) 5)  ! Paxil (Paroxetine Hcl) 6)  ! Vioxx 7)  !  Oxycodone Hcl 8)  ! * Shrimp 9)  ! * Milk 10)  ! Levaquin 11)  ! * Cotton Seed 12)  ! * Dust 13)  ! * Molds 14)  ! * Grasses 15)  ! * Trees 16)  ! * Weeds 17)  ! * Egg 18)  ! * Strawberries 19)  ! * Soy  Past History:  Past Medical History: chronic neck pain Allergic rhinitis moderate persistent asthma    -FeV1 72% 2011    -IgE 102 2011    -CT sinus Neg 2011 Breast cancer GERD Hypothyroidism Osteoporosis Hiatal Hernia Diverticulosis Hemorrhoids COMPLEX MED REGIMEN-Meds reviewed with pt education and computerized med calendar completed 01/02/10 asthma  IBS  Past Surgical History: Reviewed history from 12/30/2009 and no changes required. discectomy  C3-C4  2003 discectomy  C6-T  09/03 Hysterectomy  1988 Tonsillectomy, adenoidectomy as child deviated septum  1980's ant post repair  1990's Lumpectomy  1998 L breast Cholecystectomy R. Elbow surgery L. Knee surgery R. Hand surgery  Family History: Reviewed history from 01/02/2010 and no changes required. 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  Daily Caffeine Use: 1 per day  Review of Systems        recent cough and now with laryngitis attributed to [facility]  Vital Signs:  Patient profile:   60 year old female Height:      61 inches Weight:      112.2 pounds BMI:     21.28 Pulse rate:   88 / minute Pulse rhythm:   regular BP sitting:   140 / 60  (left arm) Cuff size:   regular  Vitals Entered By: Harlow Mares CMA Duncan Dull) (February 22, 2010 1:31 PM)  Physical Exam  General:  Well developed, well nourished, no acute distress.   Impression &  Recommendations:  Problem # 1:  IRRITABLE BOWEL SYNDROME (ICD-564.1) Assessment Improved She appears to have improved due to metronidazole therapy. this was an empiric treatment for possible small bowel bacterial overgrowth. she will see if she can stop Creon, continue align for now. I gacve her an rx for metronidazole 250 mg three times a day x 7 days to use when/if significant and persistent gas and bloating recur. She will call us to let me know when and if she uses that. She could need a cyclical regimen and we will determine that pending her overall course.  Problem # 2:  ABDOMINAL BLOATING (ICD-787.3) Assessment: Improved  Patient Instructions: 1)  Please stop Creon.  2)  If abdominal pain, bloating, and diarrhea occur please resume Creon along with the prescription for metranidazole. Please contact our office when you start metranidazole but otherwise follow-up as needed.  3)  The medication list was reviewed and reconciled.  All  changed / newly prescribed medications were explained.  A complete medication list was provided to the patient / caregiver. Prescriptions: METRONIDAZOLE 250 MG  TABS (METRONIDAZOLE) Take 1 three times a day for 7 days for bloating and gas  #30 x 0   Entered and Authorized by:   Iva Boop MD, [facility]   Signed by:   Iva Boop MD, [facility] on 02/22/2010   Method used:   Print then Give to Patient   RxID:   [id number]

## 2010-03-23 NOTE — Progress Notes (Signed)
Summary: Qvar same as in original prescript  Phone Note Call from Patient   Caller: Patient Call For: dr Delford Field Summary of Call: Patient was seen today and Dr. Delford Field gave her the Qvar 40 micro grams inhailer, she stated that this was the same inhailer that she was put on originally.  The Qvar did not help at time and this is the samed dose. She wants to know if you want her to try this agian or if he wants her to take something else. Patient can reached [phone] Initial call taken by: Vedia Coffer,  February 27, 2010 1:32 PM  Follow-up for Phone Call        Pt wants to let Dr. Delford Field know that she has tried Qvar 40 in the past and did not have any relief. Pt has tried and failed Symbicort, Dulera, and Qvar Please advise. Thanks. Zackery Barefoot CMA  February 27, 2010 3:05 PM.  Additional Follow-up for Phone Call Additional follow up Details #1::        try the qvar again Additional Follow-up by: Storm Frisk MD,  February 27, 2010 8:20 PM    Additional Follow-up for Phone Call Additional follow up Details #2::    Pt instructed  to try Qvar again per Dr Delford Field. Abigail Miyamoto RN  February 28, 2010 8:42 AM

## 2010-03-29 NOTE — Miscellaneous (Signed)
Summary: [facility]  [facility]   Imported By: Lester [place] 03/24/2010 09:24:02  _____________________________________________________________________  External Attachment:    Type:   Image     Comment:   External Document

## 2010-03-29 NOTE — Progress Notes (Signed)
Summary: regarding zolair injections  Phone Note Call from Patient   Caller: Patient Call For: Corisa Montini Summary of Call: Patient phoned stated that Dr Delford Field wants her to start taking Zolair injections and she has spoken to several people one from [facility] and one from [employer] regarding this medication. Viviann Spare with [employer] called her back last night and he told her that he faxed the forms last night. The patient wants to know about how much the Zolair injections will cost her. She can be reached [phone] Initial call taken by: Vedia Coffer,  March 23, 2010 9:40 AM  Follow-up for Phone Call        Called and spoke with patient and advised her of the xolair process. Mailed pt out a [employer]-Pay Assistance form along with a DVD on Xolair. Pt will complete the form and mail this back in to me. Advised pt that xolair approval process with [place] is going to be approx. 2 wks, b/c [place] a prior [place]. Once all this has been approved, I will get pt in for demostration of the epi pen, have pt sign waiver and tour the allergy area. Pt is aware of above and will contact me if she has any questions once she reviews the DVD. Rhonda Cobb  March 23, 2010 11:40 AM

## 2010-04-04 ENCOUNTER — Telehealth (INDEPENDENT_AMBULATORY_CARE_PROVIDER_SITE_OTHER): Payer: Self-pay | Admitting: *Deleted

## 2010-04-04 IMAGING — MR MR BREAST BILAT WO/W CM
4 of 13 series · 11 of 48 positions shown · IV contrast (multihance)
Comparison: Studies from the [REDACTED] [HOSPITAL]
09/10/2007, 09/03/2007, 09/02/2006, 08/23/2005, 08/14/2004

CLINICAL DATA: History of left breast cancer 0882.  Possible mass
in the right breast on recent screening mammogram.

BILATERAL BREAST MRI WITH AND WITHOUT CONTRAST
TECHNIQUE: Multiplanar, multisequence MR images of both breasts
were obtained prior to and following the intravenous administration
of 10cc of Multihance.  Three dimensional images were evaluated at
the independent DynaCad workstation.

[Series 2: T2 · axial · 3.0mm · 0.42mm/px · z∈[-92,+70]mm · 3 of 55 slices shown]
[im 1/55]
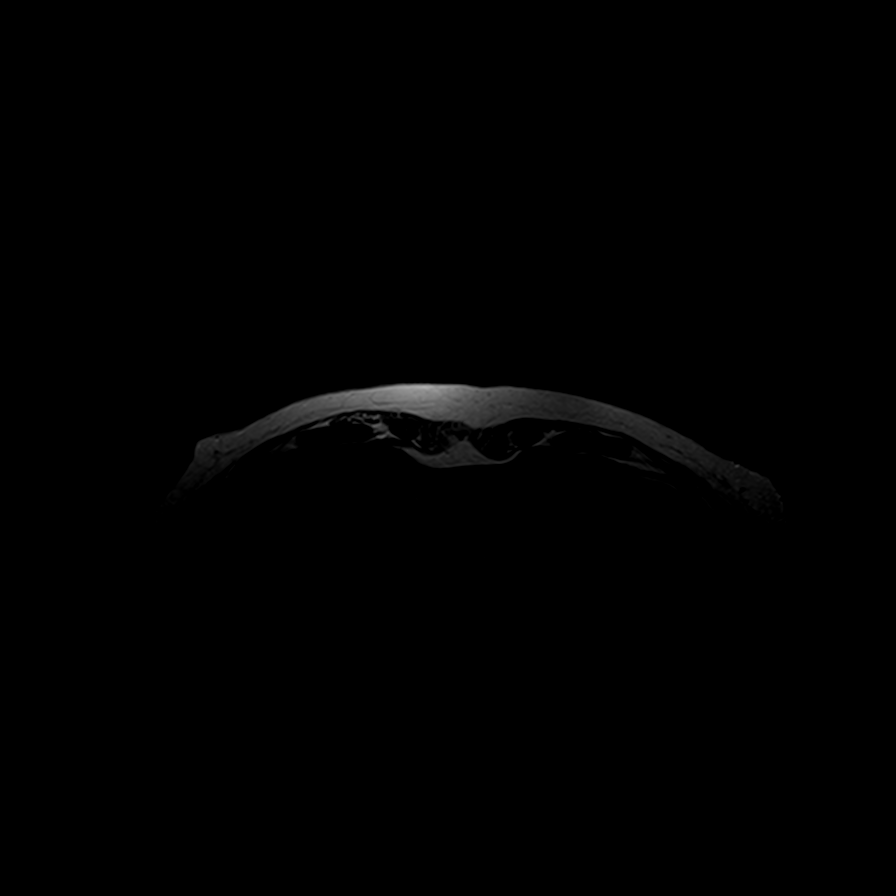
[im 28/55]
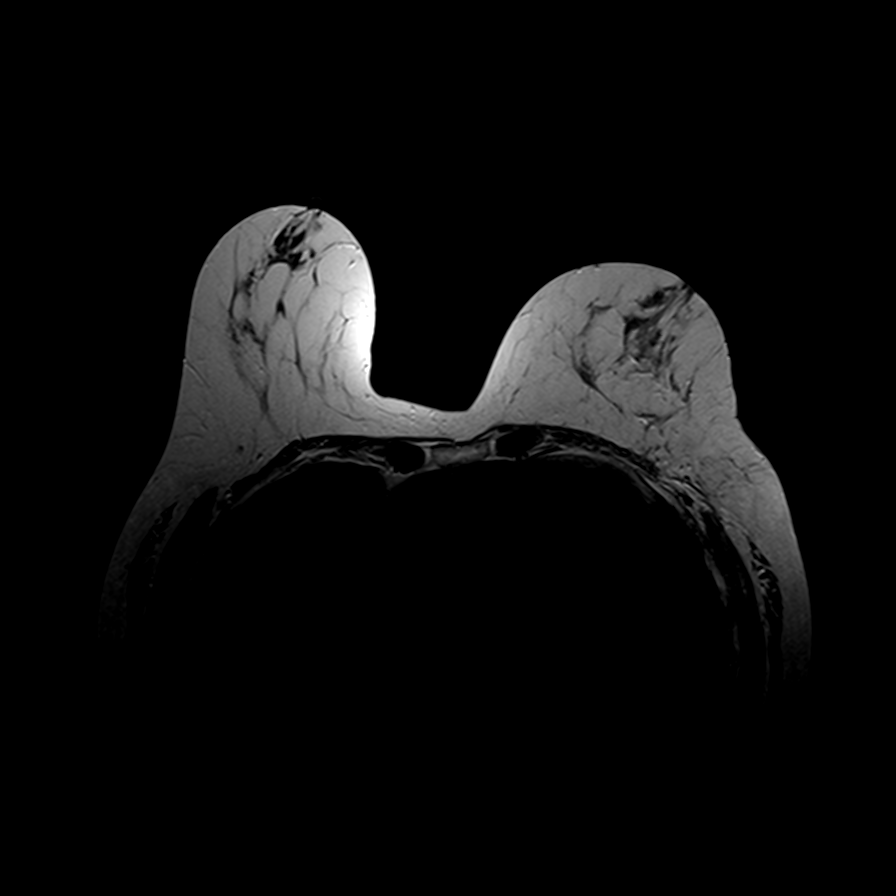
[im 55/55]
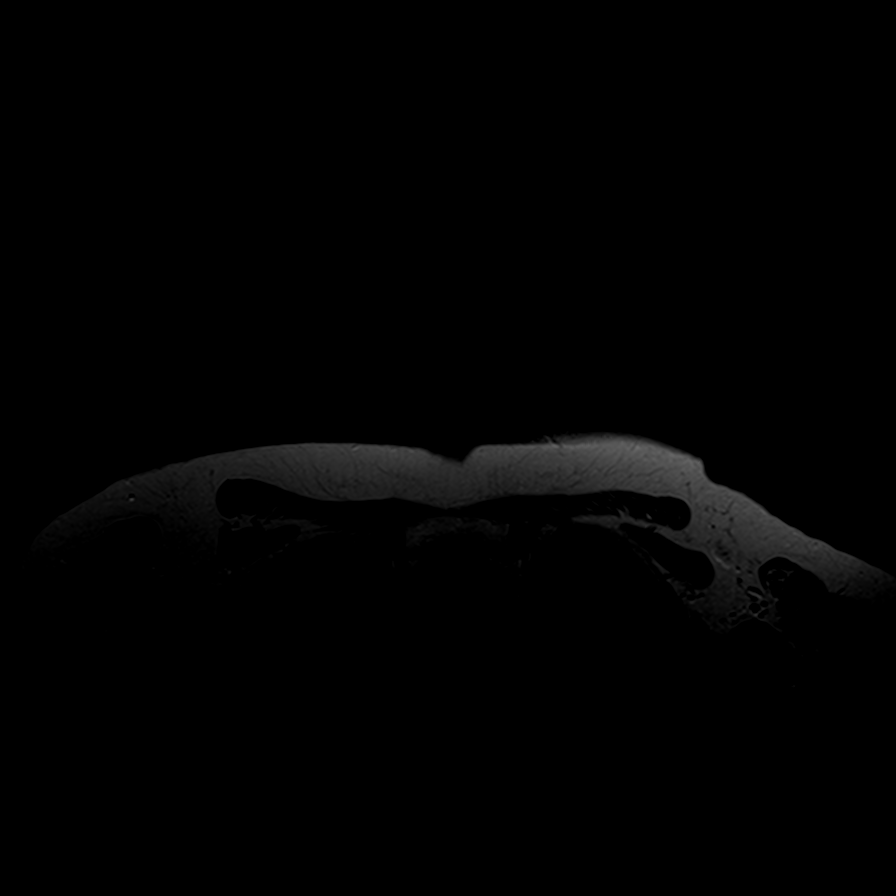

[Series 3: t2_tirm_tra ipat (a-p) · axial · 3.0mm · 0.74mm/px · z∈[-92,+70]mm · 2 of 55 slices shown]
[im 1/55]
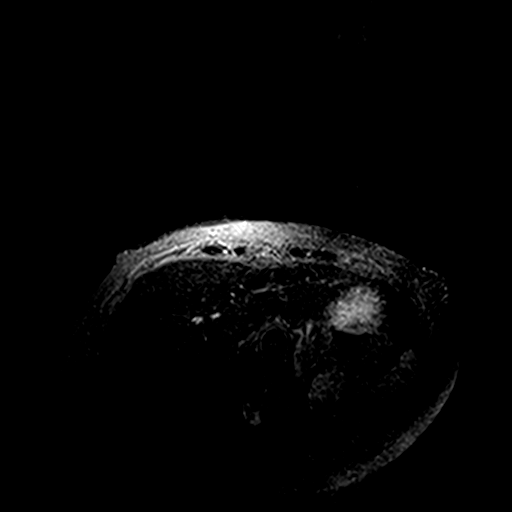
[im 55/55]
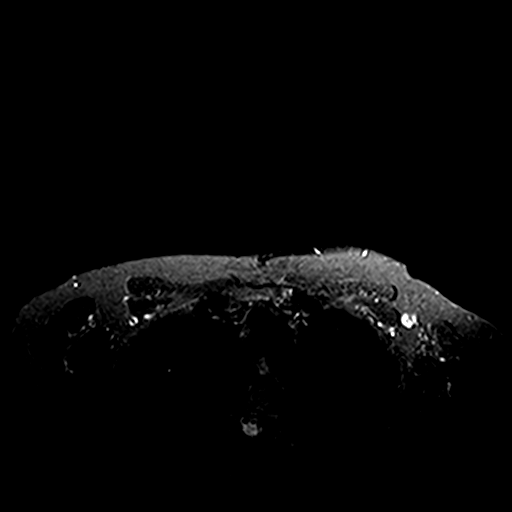

[Series 4: fl3d pre-cm no · axial · non-contrast · 1.2mm · 0.59mm/px · z∈[-96,+75]mm · 3 of 144 slices shown]
[im 1/144]
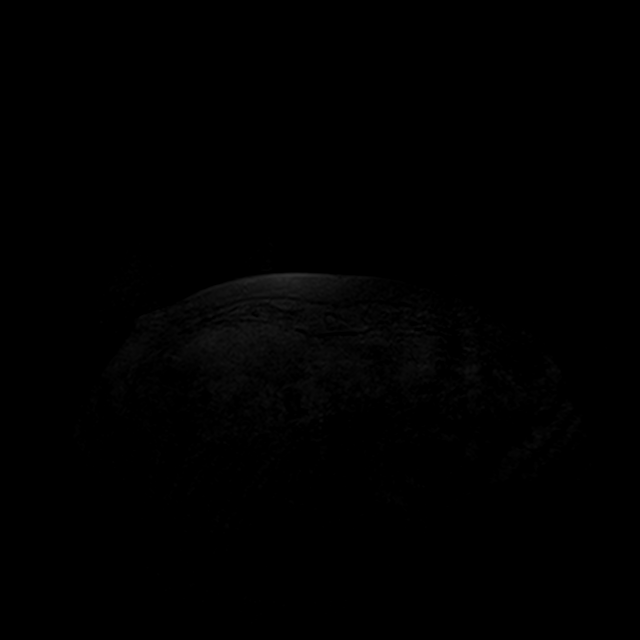
[im 72/144]
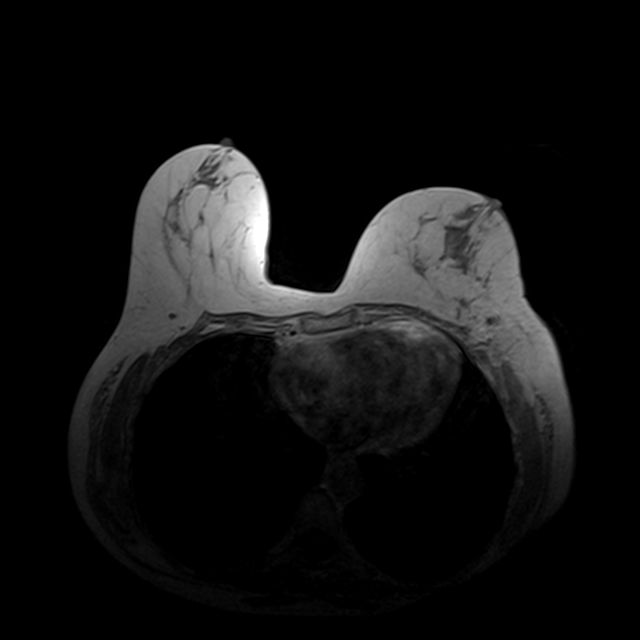
[im 144/144]
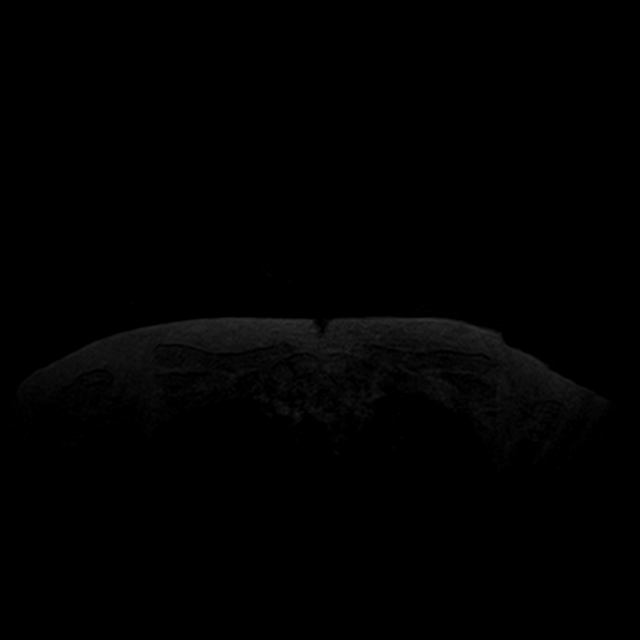

[Series 5: fl3d pre fs-cm · axial · non-contrast · 1.2mm · 0.59mm/px · z∈[-96,+75]mm · 3 of 144 slices shown]
[im 1/144]
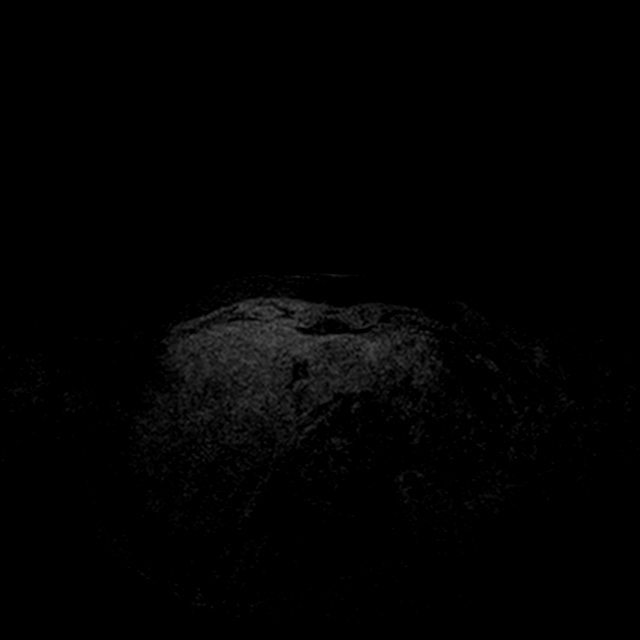
[im 72/144]
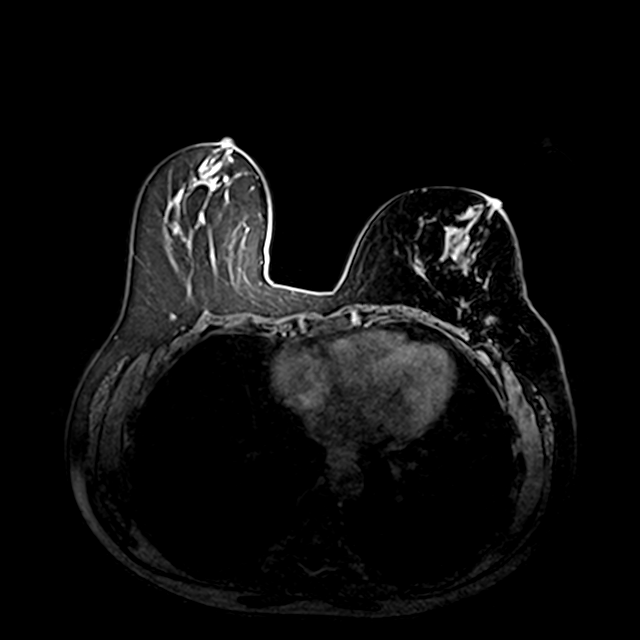
[im 144/144]
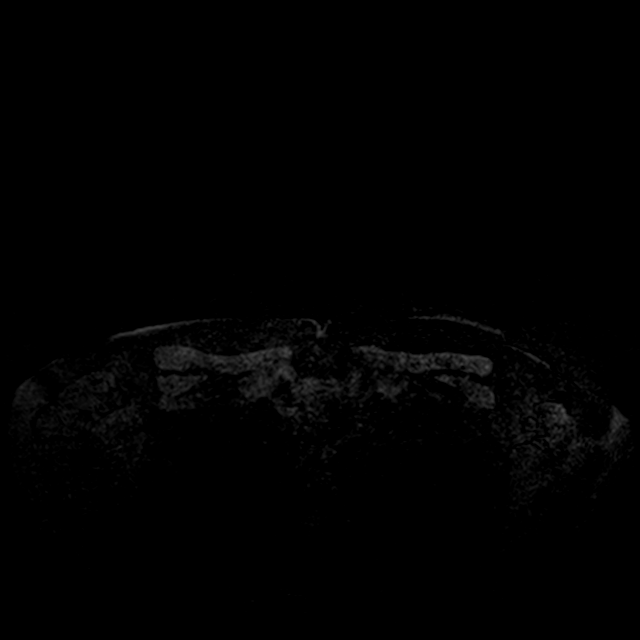

[11 of 48 positions shown; findings below may reference images not displayed]

FINDINGS: Postoperative change is seen in the lateral portion of
the left breast.  No suspicious enhancement is identified within
either breast.  Specifically, in the upper portion of the right
breast, no suspicious enhancement or mass is identified.  There is
minimal background parenchymal enhancement.  No abnormal internal
mammary or axillary lymph nodes are identified.
IMPRESSION: Expected postoperative changes on the left.  No evidence for
malignancy in either breast.  Recommendation of Dr. Monrroy, [place]
up right mammogram is recommended in 6 months.

THREE-DIMENSIONAL MR IMAGE RENDERING ON INDEPENDENT WORKSTATION:

Three-dimensional MR images were rendered by post-processing of the
original MR data on an independent workstation.  The three-
dimensional MR images were interpreted, and findings were reported
in the accompanying complete MRI report for this study.

BI-RADS CATEGORY 1:  Negative.

## 2010-04-11 ENCOUNTER — Encounter: Payer: Self-pay | Admitting: Critical Care Medicine

## 2010-04-12 ENCOUNTER — Telehealth (INDEPENDENT_AMBULATORY_CARE_PROVIDER_SITE_OTHER): Payer: Self-pay | Admitting: *Deleted

## 2010-04-12 NOTE — Progress Notes (Addendum)
Summary: jury duty  Phone Note Call from Patient   Caller: Patient Call For: wright Summary of Call: pt needs a letter asap "briefly stating" that dr Delford Field does not advise pt to do jury duty. pt is scheduled for duty 3/21 but needs this w/in a week please. mail to pt's home address (which i have verified with pt). then please call her to confirm this has been done. pt cell [phone] Initial call taken by: Tivis Ringer, CNA,  April 04, 2010 3:00 PM  Follow-up for Phone Call        Dr Delford Field, are you willing to do a letter for this pt to excuse her from jury duty? Pls advise, and if so I will have her bring her jury summons.  Follow-up by: Vernie Murders,  April 04, 2010 3:21 PM  Additional Follow-up for Phone Call Additional follow up Details #1::        yes I will do so Additional Follow-up by: Storm Frisk MD,  April 04, 2010 3:33 PM    Additional Follow-up for Phone Call Additional follow up Details #2::    Spoke with pt and advised that this is okay per PW, and we need her to bring the jury summons here to the office.  She states that she can not stop by, but will fax to [phone] number to Dr Lynelle Doctor attn.  Follow-up by: Vernie Murders,  April 04, 2010 3:37 PM   Appended Document: jury duty Letter completed and signed by Dr. Delford Field.  Letter mailed to the address provided on jury summons paperwork -- [employer], [address], [place], [place] [address] along with jury summons papers that were faxed by pt.  Pt is aware of this and requesting copy of letter to be mailed to her home address -- address verified.  Letter placed in mail to pt's home address as well -- pt aware.  Copy of jury summons paperwork given to front desk staff to scan into pt's chart.

## 2010-04-14 ENCOUNTER — Ambulatory Visit: Payer: Self-pay | Admitting: Critical Care Medicine

## 2010-04-14 ENCOUNTER — Encounter: Payer: Self-pay | Admitting: Critical Care Medicine

## 2010-04-18 NOTE — Letter (Signed)
Summary: [facility] for Xolair/[employer]  [facility] for X[facility]   Imported By: Sherian Rein 04/12/2010 12:40:43  _____________________________________________________________________  External Attachment:    Type:   Image     Comment:   External Document

## 2010-04-18 NOTE — Letter (Signed)
Summary: [facility]  [address]   [place], [place] [address]   Phone: [phone]  Fax: [phone]    04/14/2010  To whom it may concern:  Re:  Ebony Scott      [address]      [place], [place]  [address]  Ms. Yanes is a patient of mine I am following for moderate to severe asthma.  Due to her pulmonary disease, it would difficult for her to sit in a confined room for a long period of time for jury duty.  Please take into consideration when reviewing her request for jury duty.  If I can be of any further assistance, please do not hesitate to call my office.     Sincerely,      Shan Levans, MD

## 2010-04-18 NOTE — Medication Information (Signed)
Summary: Xolair/[employer]  Xolair/[employer]   Imported By: Sherian Rein 04/12/2010 12:41:41  _____________________________________________________________________  External Attachment:    Type:   Image     Comment:   External Document

## 2010-04-18 NOTE — Letter (Signed)
Summary: External Correspondence  External Correspondence   Imported By: Valinda Hoar 04/14/2010 11:42:06  _____________________________________________________________________  External Attachment:    Type:   Image     Comment:   External Document

## 2010-04-18 NOTE — Progress Notes (Signed)
Summary: epipen rx  Phone Note Call from Patient   Caller: Patient Call For: wright Summary of Call: pt has an appt 3/14. she wants to begin her xolair shot at that time- (per rhonda this will be ok). pt says she needs a rx for epipen to have at that time. [facility]. pt # [phone] Initial call taken by: Tivis Ringer, CNA,  April 12, 2010 4:59 PM  Follow-up for Phone Call        PW is this ok to send in for pt to have epipen?  please advise. thanks Randell Loop CMA  April 12, 2010 5:03 PM   Additional Follow-up for Phone Call Additional follow up Details #1::        this is ok Additional Follow-up by: Storm Frisk MD,  April 12, 2010 5:49 PM    Additional Follow-up for Phone Call Additional follow up Details #2::    called and spoke with pt.  pt aware rx sent to pharmacy. Megan Reynolds LPN  April 13, 2010 9:30 AM   New/Updated Medications: EPIPEN 2-PAK 0.3 MG/0.3ML DEVI (EPINEPHRINE) use as directed Prescriptions: EPIPEN 2-PAK 0.3 MG/0.3ML DEVI (EPINEPHRINE) use as directed  #1 x 2   Entered by:   Arman Filter LPN   Authorized by:   Storm Frisk MD   Signed by:   Arman Filter LPN on 09/81/1914   Method used:   Electronically to        [facility]* (retail)       [address] [place], [place]  [address]       Ph: [phone] or [phone]       Fax: [phone]   RxID:   [id number]

## 2010-05-03 ENCOUNTER — Encounter: Payer: Self-pay | Admitting: Critical Care Medicine

## 2010-05-03 ENCOUNTER — Ambulatory Visit (INDEPENDENT_AMBULATORY_CARE_PROVIDER_SITE_OTHER): Payer: Medicare Other | Admitting: Critical Care Medicine

## 2010-05-03 ENCOUNTER — Telehealth (INDEPENDENT_AMBULATORY_CARE_PROVIDER_SITE_OTHER): Payer: Self-pay | Admitting: *Deleted

## 2010-05-03 DIAGNOSIS — J309 Allergic rhinitis, unspecified: Secondary | ICD-10-CM

## 2010-05-03 DIAGNOSIS — J45909 Unspecified asthma, uncomplicated: Secondary | ICD-10-CM

## 2010-05-04 ENCOUNTER — Telehealth (INDEPENDENT_AMBULATORY_CARE_PROVIDER_SITE_OTHER): Payer: Self-pay | Admitting: *Deleted

## 2010-05-05 ENCOUNTER — Encounter: Payer: Self-pay | Admitting: Critical Care Medicine

## 2010-05-07 IMAGING — CT CT CHEST W/O CM
2 of 4 series · 15 of 36 positions shown, 18 images · non-contrast
Comparison: 09/05/2006

CLINICAL DATA: Evaluate lung nodules

CT CHEST WITHOUT CONTRAST
TECHNIQUE: Multidetector CT imaging of the chest was performed
following the standard protocol without IV contrast.

[Series 2: chest routine 5.0 b40f · axial · 0.61mm/px · z∈[-421,-156]mm · 12 of 63 slices shown, 15 images]
[im 5/63  mediastinal]
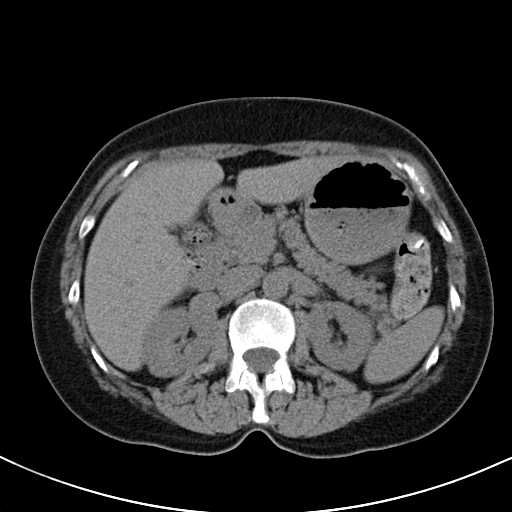
[im 5/63  lung]
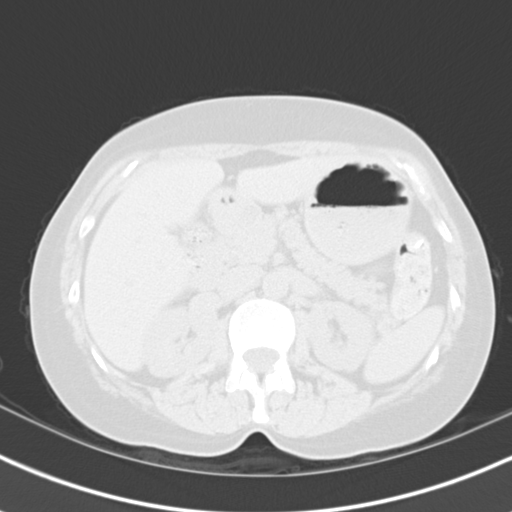
[im 9/63  lung]
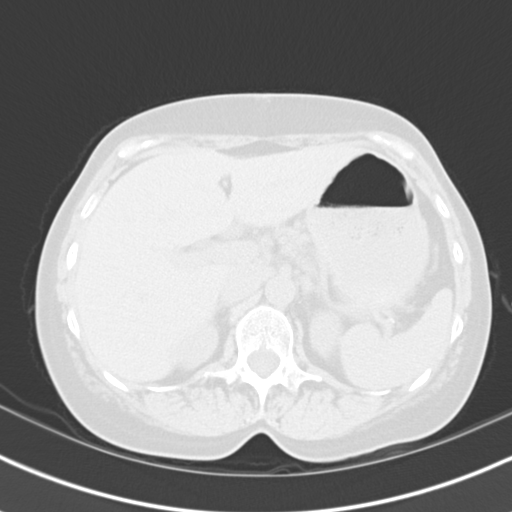
[im 14/63  lung]
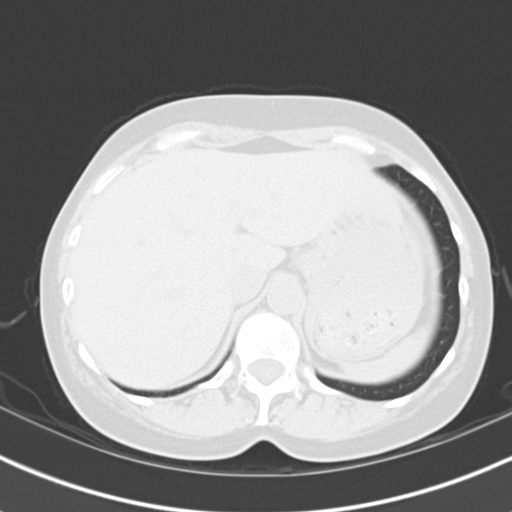
[im 18/63  lung]
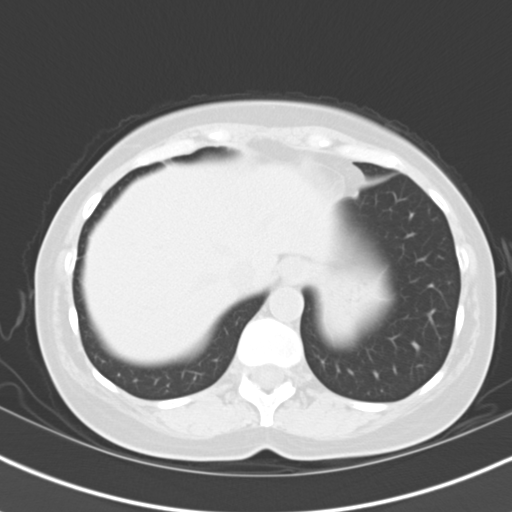
[im 23/63  mediastinal]
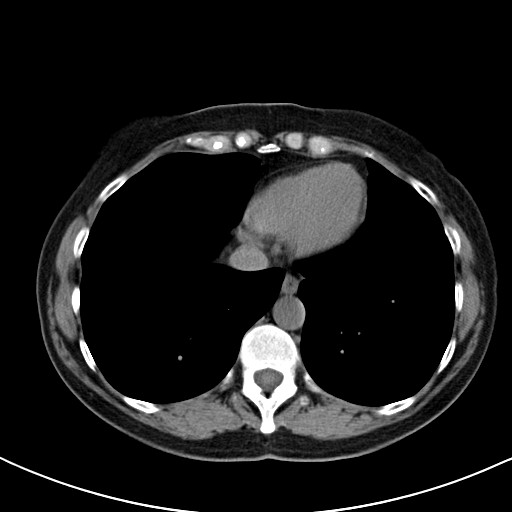
[im 23/63  lung]
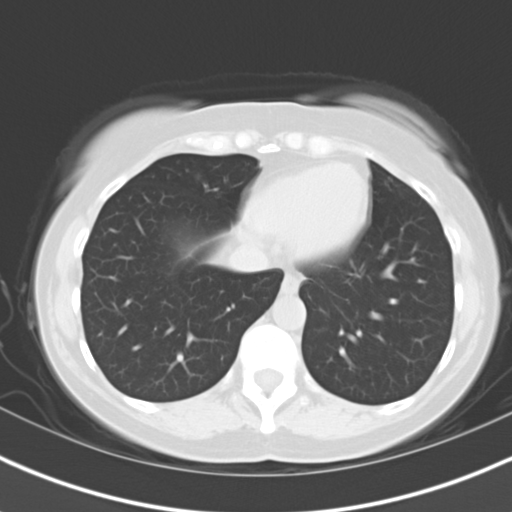
[im 27/63  lung]
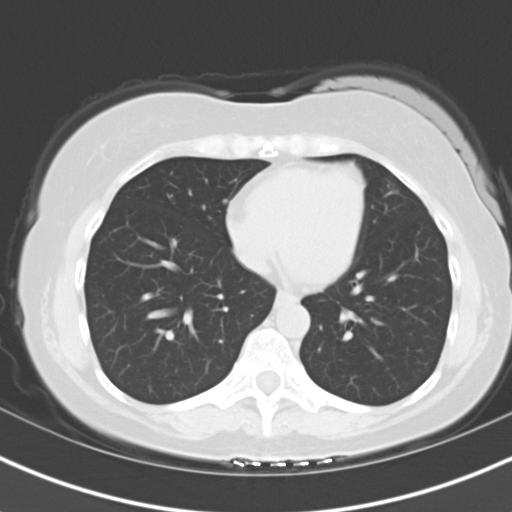
[im 36/63  lung]
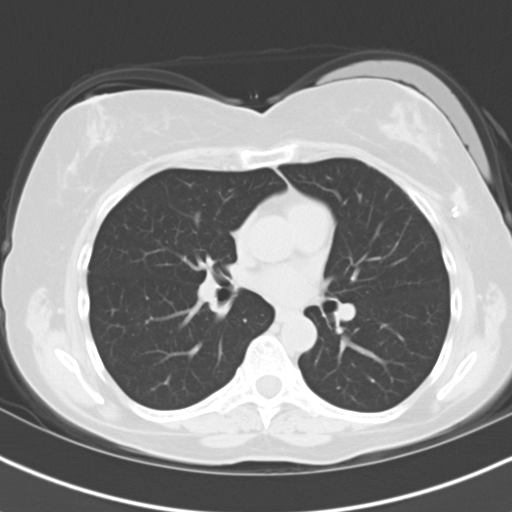
[im 40/63  lung]
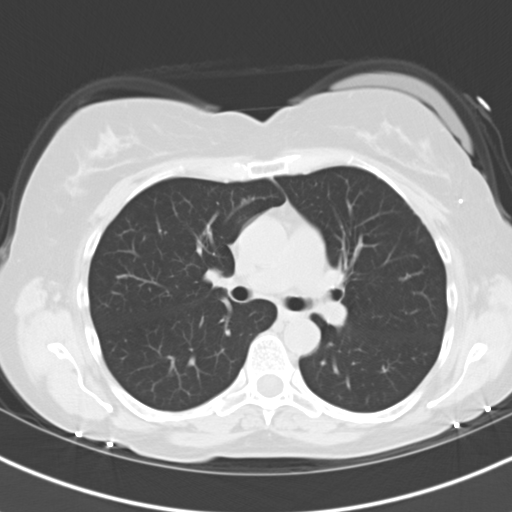
[im 45/63  mediastinal]
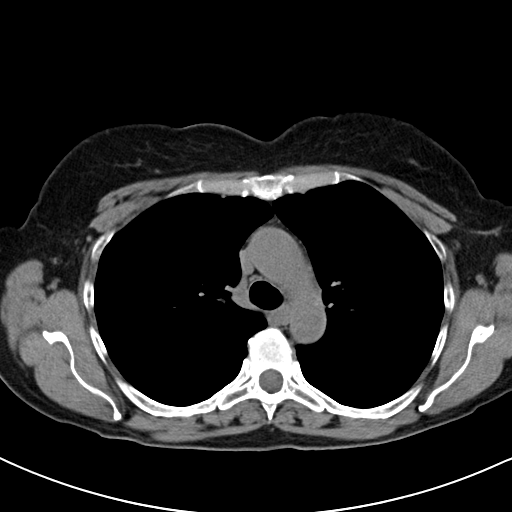
[im 45/63  lung]
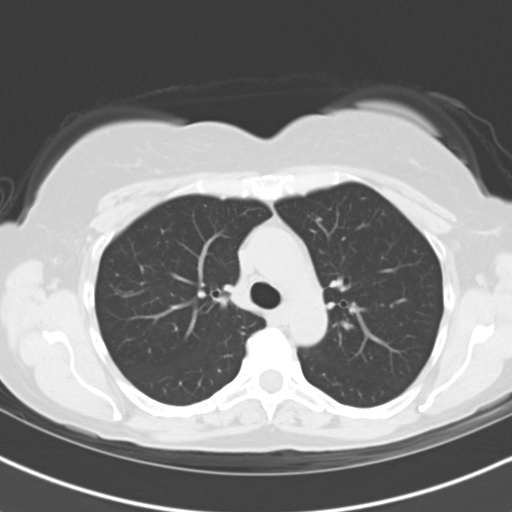
[im 49/63  lung]
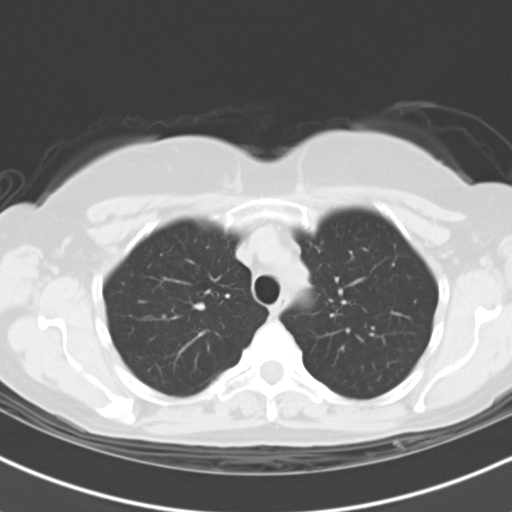
[im 54/63  lung]
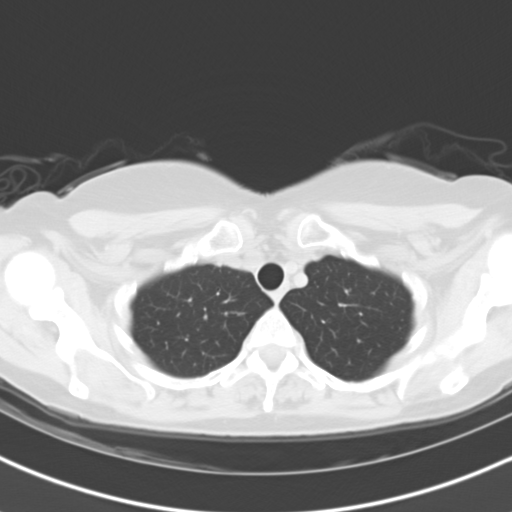
[im 58/63  lung]
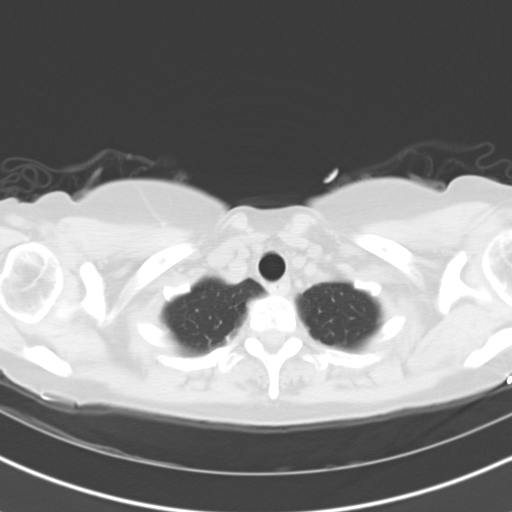

[Series 602: <mpr thick range> · coronal · 0.61mm/px · 3 of 71 slices shown]
[im 15/71  lung]
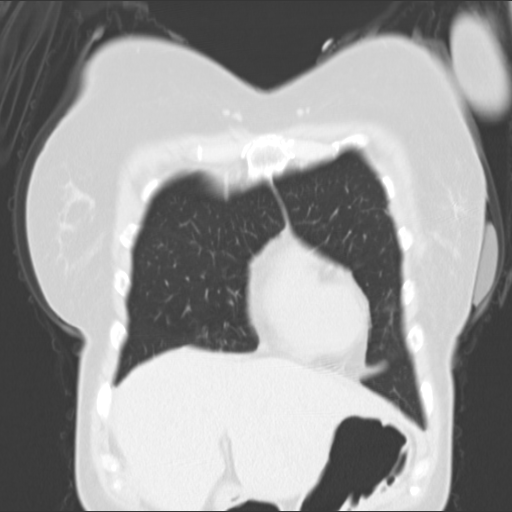
[im 29/71  lung]
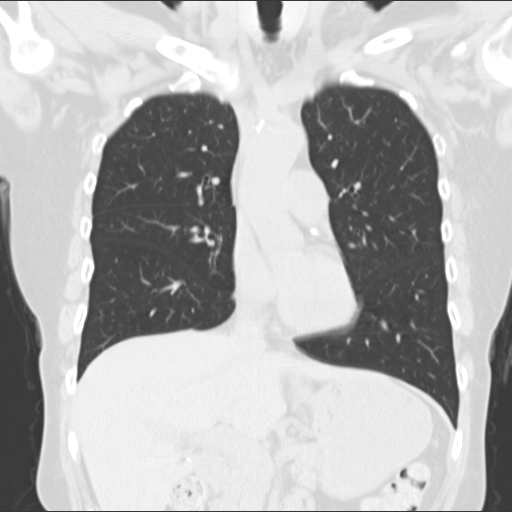
[im 43/71  lung]
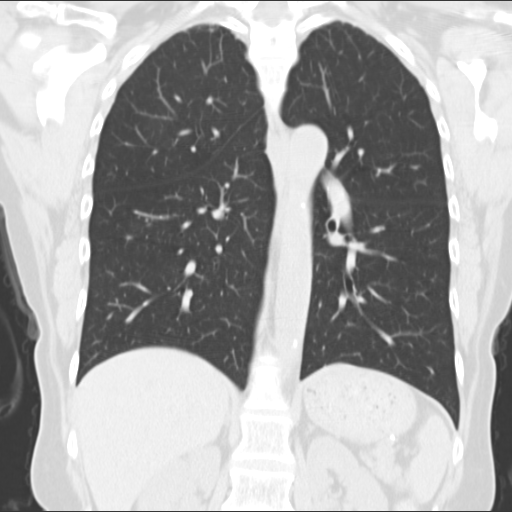

[15 of 36 positions shown; findings below may reference images not displayed]

FINDINGS: There are no enlarged axillary or supraclavicular lymph
nodes.

There is no enlarged mediastinal or hilar lymph nodes.

No enlarged mediastinal or hilar lymph nodes.

No pericardial or pleural fluid.

Stable noncalcified nodule within the left upper lobe measuring 2
mm, image 15.

Lingular nodule measures 2 mm, image 33.  This is unchanged from
previous exam.

Left base nodule is stable measuring 5-6 mm, image 47.

Right base nodule is also stable measuring 45 mm, image 38.

No new or enlarging pulmonary nodules or masses.

Review of the visualized osseous structures is unremarkable.

The the patient is status post cholecystectomy.
IMPRESSION: 1.  Stable small bilateral pulmonary nodules.  These remain
nonspecific. Given the stability of these nodules these are likely
benign.  A follow-up examination in 12 months is advised to ensure
continued stability.

## 2010-05-08 ENCOUNTER — Telehealth: Payer: Self-pay | Admitting: Critical Care Medicine

## 2010-05-09 NOTE — Assessment & Plan Note (Signed)
Summary: Pulmonary OV   Copy to:  Dr. Lynnea Ferrier Primary Provider/Referring Provider:  Dr. Candie Echevaria  CC:  2 month follow up/first xolair injection.  Pt states breathing is slightly worse at times.  Chest tightness.  Nonprod cough.  .  History of Present Illness:  Initial Pulmonary Consultation 12/23/09   February 27, 2010 11:45 AM Pt still with nose bleeds and gets out clots and is hoarse Pt is worse since stopped dulera Pt was improving on dulera first of jan.   Pt notes pfr 200s  then increased to close to 300 while on dulera but not able to tolerate dulera.  did reduce dose to 1 puff two times a day on dulera wiht less jitteriness.  March 09, 2010 2:29 PM Still dyspneic,  used qvar last night.  was ok on dulera. symbicort>>qvar>>>dulera>>qvar no time off  home assessment: no mold found.  windex issue,  liquids or wipes better vs powders ,  mask if dust and vacuums,  hepafilters on vacuums, washed with soap and water  vacuum,  mattress issues  qvar does not cause as much throat irritation  as dulera   May 03, 2010 2:18 PM Since January, still dyspneic,  now pulled back muscle.  Not doing much. some cough, no mucus.  Pt is dyspneic  dulera one puff daily.  Now is better on dulera but not as good on twice daily but is less hoarse on one puff daily Uses reclast once a year, usu dec.  Had breast CA in 1998    Current Medications (verified): 1)  Benadryl 25 Mg Caps (Diphenhydramine Hcl) .... Take 1 Capsule By Mouth Three Times A Day 2)  Vinate Ultra  Tabs (Prenatal Vit-Dss-Fe Cbn-Fa) .... Take 1 Tablet By Mouth Once A Day 3)  Mucinex 600 Mg Xr12h-Tab (Guaifenesin) .... Take 1 Tablet By Mouth Two Times A Day 4)  Citracal Maximum 315-250 Mg-Unit Tabs (Calcium Citrate-Vitamin D) .... Take 1 Tablet By Mouth Two Times A Day 5)  Vitamin D 2000 Unit Tabs (Cholecalciferol) .... 1/2 Tab By Mouth At Bedtime 6)  Melatonin 3 Mg Tabs (Melatonin) .... 3 Tabs By Mouth At Bedtime 7)   Propylthiouracil 50 Mg Tabs (Propylthiouracil) .... Take 1 Tablet By Mouth Two Times A Day 8)  Crestor 20 Mg Tabs (Rosuvastatin Calcium) .... 1/2 Tab By Mouth At Bedtime 9)  Alpha-Lipoic Acid 600 Mg Caps (Alpha-Lipoic Acid) .... Once Daily 10)  Gabapentin 600 Mg Tabs (Gabapentin) .... Take 1 Tablet By Mouth Once A Day 11)  Align  Caps (Probiotic Product) .... Once Daily 12)  Tylenol Extra Strength 500 Mg Tabs (Acetaminophen) .... 2 Tabs By Mouth Three Times A Day 13)  Systane Ultra 0.4-0.3 % Soln (Polyethyl Glycol-Propyl Glycol) .Marland Kitchen.. 1-2 Drops Each Eye  Every Morning and At Bedtime 14)  Lidoderm 5 % Ptch (Lidocaine) .... Apply Patch in The Morning and Remove At Bedtime As Needed 15)  Lactaid 3000 Unit Tabs (Lactase) .... Take 3-4 Tabs With Dairy Liquids and Solids 16)  Nystatin-Triamcinolone 100000-0.1 Unit/gm-% Crea (Nystatin-Triamcinolone) .... Apply As Needed 17)  Ayr Saline Nasal  Gel (Saline) .... At Bedtime 18)  Beano  Tabs (Alpha-D-Galactosidase) .... Per Box 19)  Gas-X Extra Strength 125 Mg Caps (Simethicone) .... Per Box 20)  Proctozone-Hc 2.5 % Crea (Hydrocortisone) .... As Needed 21)  Tea Tree Oil  Oil (Tea Tree Oil) .Marland Kitchen.. 1-2 Times Daily 22)  First-Dukes Mouthwash  Susp (Diphenhyd-Hydrocort-Nystatin) .... Gargle and Swallow 1 Teaspoon Four Times A  Day As Needed 23)  Refresh P.m.  Oint (Artificial Tear Ointment) .... Apply Small Amount Under Eye Lids At Bedtime 24)  Triple Antibiotic [phone] Oint (Neomycin-Bacitracin-Polymyxin) .... Apply in Eash Nostril At Bedtime As Needed 25)  Cool Mist Humidifier  Misc (Humidifiers) .... At Bedtime 26)  Creon 24000 Unit Cpep (Pancrelipase (Lip-Prot-Amyl)) .... Once With Each Mea 27)  Dulera 200-5 Mcg/act Aero (Mometasone Furo-Formoterol Fum) .... One Puff Daily 28)  Epipen 2-Pak 0.3 Mg/0.60ml Devi (Epinephrine) .... Use As Directed 29)  Skelaxin 800 Mg Tabs (Metaxalone) .... 2-4 Times Daily As Needed 30)  Naproxen Sodium 220 Mg Tabs (Naproxen  Sodium) .... 2-3 Times Daily As Needed  Allergies (verified): 1)  ! Morphine Sulfate Cr (Morphine Sulfate) 2)  ! Penicillin V Potassium (Penicillin V Potassium) 3)  ! Tetracycline Hcl (Tetracycline Hcl) 4)  ! Lortab 10 (Hydrocodone-Acetaminophen) 5)  ! Paxil (Paroxetine Hcl) 6)  ! Vioxx 7)  ! Oxycodone Hcl 8)  ! * Shrimp 9)  ! * Milk 10)  ! Levaquin 11)  ! * Cotton Seed 12)  ! * Dust 13)  ! * Molds 14)  ! * Grasses 15)  ! * Trees 16)  ! * Weeds 17)  ! * Egg 18)  ! * Strawberries 19)  ! * Soy  Past History:  Past medical, surgical, family and social histories (including risk factors) reviewed, and no changes noted (except as noted below).  Past Medical History: chronic neck pain Allergic rhinitis moderate persistent asthma    -FeV1 72% 2011    -IgE 102 2011    -CT sinus Neg 2011 Breast cancer   -in remission GERD Hypothyroidism Osteoporosis   -on reclast yearly Hiatal Hernia Diverticulosis Hemorrhoids COMPLEX MED REGIMEN-Meds reviewed with pt education and computerized med calendar completed 01/02/10 asthma  IBS  Past Surgical History: Reviewed history from 12/30/2009 and no changes required. discectomy  C3-C4  2003 discectomy  C6-T  09/03 Hysterectomy  1988 Tonsillectomy, adenoidectomy as child deviated septum  1980's ant post repair  1990's Lumpectomy  1998 L breast Cholecystectomy R. Elbow surgery L. Knee surgery R. Hand surgery  Family History: Reviewed history from 01/02/2010 and no changes required. allergies - mother, father heart disease - mother, father arthritis - mother, father mother deceased from lung ca Family History of Colon Cancer: Maternal Half Uncle, and Half Maternal Aunt  Family History of Colitis: Daughter   Social History: Reviewed history from 02/22/2010 and no changes required. Single, 2 biological, 1 step son Disabled, retired Aeronautical engineer Hygentist 1 glasses of wine at bedtime twice a week Patient has never  smoked.  Daily Caffeine Use: 1 per day  Review of Systems       The patient complains of shortness of breath with activity and non-productive cough.  The patient denies shortness of breath at rest, productive cough, coughing up blood, chest pain, irregular heartbeats, acid heartburn, indigestion, loss of appetite, weight change, abdominal pain, difficulty swallowing, sore throat, tooth/dental problems, headaches, nasal congestion/difficulty breathing through nose, sneezing, itching, ear ache, anxiety, depression, hand/feet swelling, joint stiffness or pain, rash, change in color of mucus, and fever.    Vital Signs:  Patient profile:   60 year old female Height:      61 inches Weight:      113.25 pounds BMI:     21.48 O2 Sat:      97 % on Room air Temp:     98.0 degrees F oral Pulse rate:   70 /  minute BP sitting:   114 / 60  (right arm) Cuff size:   regular  Vitals Entered By: Gweneth Dimitri RN (May 03, 2010 2:09 PM)  O2 Flow:  Room air CC: 2 month follow up/first xolair injection.  Pt states breathing is slightly worse at times.  Chest tightness.  Nonprod cough.   Comments Medications reviewed with patient Daytime contact number verified with patient. Gweneth Dimitri RN  May 03, 2010 2:10 PM    Physical Exam  Additional Exam:  Gen: Pleasant, well-nourished, in no distress,  normal affect ENT: ulcerations on ant nasal septum bilateral ,   mouth clear,  oropharynx clear, no postnasal drip Neck: No JVD, no TMG, no carotid bruits Lungs: No use of accessory muscles, no dullness to percussion, distant BS Cardiovascular: RRR, heart sounds normal, no murmur or gallops, no peripheral edema Abdomen: soft and NT, no HSM,  BS normal Musculoskeletal: No deformities, no cyanosis or clubbing Neuro: alert, non focal Skin: Warm, no lesions or rashes    Impression & Recommendations:  Problem # 1:  EXTRINSIC ASTHMA, UNSPECIFIED (ICD-493.00) Assessment Unchanged  moderate persistent  asthma with good response to dulera, but side effects of upper airway irritation even with spacer, qvar also caused hoarseness and not as effective as dulera plan cont dulera start xolair today  Medications Added to Medication List This Visit: 1)  Benadryl 25 Mg Caps (Diphenhydramine hcl) .... Take 1 capsule by mouth three times a day 2)  Tylenol Extra Strength 500 Mg Tabs (Acetaminophen) .... 2 tabs by mouth three times a day 3)  Tea Tree Oil Oil (Tea tree oil) .Marland Kitchen.. 1-2 times daily 4)  Triple Antibiotic [phone] Oint (Neomycin-bacitracin-polymyxin) .... Apply in eash nostril at bedtime as needed 5)  Skelaxin 800 Mg Tabs (Metaxalone) .... 2-4 times daily as needed 6)  Naproxen Sodium 220 Mg Tabs (Naproxen sodium) .... 2-3 times daily as needed 7)  Reclast 5 Mg/169ml Soln (Zoledronic acid) .... Once yearly 8)  Mucinex D 60-600 Mg Xr12h-tab (Pseudoephedrine-guaifenesin) .... One by mouth two times a day as needed 9)  Xolair 150 Mg Solr (Omalizumab) .Marland KitchenMarland KitchenMarland Kitchen 150   sq every  4 weeks  Complete Medication List: 1)  Benadryl 25 Mg Caps (Diphenhydramine hcl) .... Take 1 capsule by mouth three times a day 2)  Vinate Ultra Tabs (Prenatal vit-dss-fe cbn-fa) .... Take 1 tablet by mouth once a day 3)  Mucinex 600 Mg Xr12h-tab (Guaifenesin) .... Take 1 tablet by mouth two times a day 4)  Citracal Maximum 315-250 Mg-unit Tabs (Calcium citrate-vitamin d) .... Take 1 tablet by mouth two times a day 5)  Vitamin D 2000 Unit Tabs (Cholecalciferol) .... 1/2 tab by mouth at bedtime 6)  Melatonin 3 Mg Tabs (Melatonin) .... 3 tabs by mouth at bedtime 7)  Propylthiouracil 50 Mg Tabs (Propylthiouracil) .... Take 1 tablet by mouth two times a day 8)  Crestor 20 Mg Tabs (Rosuvastatin calcium) .... 1/2 tab by mouth at bedtime 9)  Alpha-lipoic Acid 600 Mg Caps (Alpha-lipoic acid) .... Once daily 10)  Gabapentin 600 Mg Tabs (Gabapentin) .... Take 1 tablet by mouth once a day 11)  Align Caps (Probiotic product) .... Once  daily 12)  Tylenol Extra Strength 500 Mg Tabs (Acetaminophen) .... 2 tabs by mouth three times a day 13)  Systane Ultra 0.4-0.3 % Soln (Polyethyl glycol-propyl glycol) .Marland Kitchen.. 1-2 drops each eye  every morning and at bedtime 14)  Lidoderm 5 % Ptch (Lidocaine) .... Apply patch in the morning and remove  at bedtime as needed 15)  Lactaid 3000 Unit Tabs (Lactase) .... Take 3-4 tabs with dairy liquids and solids 16)  Nystatin-triamcinolone 100000-0.1 Unit/gm-% Crea (Nystatin-triamcinolone) .... Apply as needed 17)  Ayr Saline Nasal Gel (Saline) .... At bedtime 18)  Beano Tabs (Alpha-d-galactosidase) .... Per box 19)  Gas-x Extra Strength 125 Mg Caps (Simethicone) .... Per box 20)  Proctozone-hc 2.5 % Crea (Hydrocortisone) .... As needed 21)  Tea Tree Oil Oil (Tea tree oil) .Marland Kitchen.. 1-2 times daily 22)  First-dukes Mouthwash Susp (Diphenhyd-hydrocort-nystatin) .... Gargle and swallow 1 teaspoon four times a day as needed 23)  Refresh P.m. Oint (Artificial tear ointment) .... Apply small amount under eye lids at bedtime 24)  Triple Antibiotic [phone] Oint (Neomycin-bacitracin-polymyxin) .... Apply in eash nostril at bedtime as needed 25)  Cool Mist Humidifier Misc (Humidifiers) .... At bedtime 26)  Creon 24000 Unit Cpep (Pancrelipase (lip-prot-amyl)) .... Once with each mea 27)  Dulera 200-5 Mcg/act Aero (Mometasone furo-formoterol fum) .... One puff daily 28)  Epipen 2-pak 0.3 Mg/0.60ml Devi (Epinephrine) .... Use as directed 29)  Skelaxin 800 Mg Tabs (Metaxalone) .... 2-4 times daily as needed 30)  Naproxen Sodium 220 Mg Tabs (Naproxen sodium) .... 2-3 times daily as needed 31)  Reclast 5 Mg/172ml Soln (Zoledronic acid) .... Once yearly 32)  Mucinex D 60-600 Mg Xr12h-tab (Pseudoephedrine-guaifenesin) .... One by mouth two times a day as needed 33)  Xolair 150 Mg Solr (Omalizumab) .Marland KitchenMarland KitchenMarland Kitchen 150   sq every  4 weeks  Other Orders: Est. Patient Level IV (16109) Xolair (omalizumab) 150mg   (U0454) Administration xolair injection [phone])  Patient Instructions: 1)  Use Hepa filter masks to clean 2)  No change in medications 3)  Return 2 months Prescriptions: MUCINEX D 60-600 MG XR12H-TAB (PSEUDOEPHEDRINE-GUAIFENESIN) one by mouth two times a day as needed  #60 x 4   Entered and Authorized by:   Storm Frisk MD   Signed by:   Storm Frisk MD on 05/03/2010   Method used:   Print then Give to Patient   RxID:   [id number]    Medication Administration  Injection # 1:    Medication: Xolair (omalizumab) 150mg     Diagnosis: EXTRINSIC ASTHMA, UNSPECIFIED (ICD-493.00)    Route: SQ    Site: L deltoid    Exp Date: 04/2013    Lot #: [id number]    Mfr: Salome Spotted    Comments: 1.2 ml in left arm charged 150 mg charged [id number] and  29528    Given by: TAMMY SCOTT IN [facility]  Orders Added: 1)  Est. Patient Level IV [41324] 2)  Xolair (omalizumab) 150mg  [J2357] 3)  Administration xolair injection [40102]

## 2010-05-09 NOTE — Letter (Signed)
Summary: [facility]  [address]   [place], [place] [address]   Phone: [phone]  Fax: [phone]    05/05/2010  Ebony Scott [address] [place], [place]  [address]  [place]  To whom it may concern:  The above patient will benefit from a Meile vacuum cleaner, meile filters, and Meile bags.   Sincerely yours,      Shan Levans, MD

## 2010-05-09 NOTE — Miscellaneous (Signed)
Summary: Letter for vacuum cleaner  Clinical Lists Changes To whom it may concern:   This pt would benefit from the Miele hepa filters for her vacuum cleaner.   Sincerely yours     Shan Levans MD

## 2010-05-09 NOTE — Progress Notes (Signed)
Summary: wants clarification on mask type--75m okay to use  Phone Note Call from Patient   Caller: Patient Call For: WRIGHT Summary of Call: Patient phoned stated that she could not find a mask with a Hepa filter and the only one that [employer] had made her look like an alien. She is at [facility] and they have a N95 made by 48M and she wants to know if this is what he had in mind. Dhe can be reached at [phone] Initial call taken by: Vedia Coffer,  May 04, 2010 12:01 PM  Follow-up for Phone Call        Called and spoke with pt and she states she has been to every hardware store to find a hepa filter mask and has been unable to find it. Pt states Dr. Delford Field recommended this type of mask for using whe she dusts/ vacuum's around the house. 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 Pt wants clarification on what type of mask she needs and where she can buy it from or if Dr. Delford Field has other recommendations on what type of mask she can use. Dr. Delford Field please advise. Thanks Carver Fila  May 04, 2010 2:52 PM   Additional Follow-up for Phone Call Additional follow up Details #1::        the 48M mask is fine Additional Follow-up by: Storm Frisk MD,  May 04, 2010 3:07 PM    Additional Follow-up for Phone Call Additional follow up Details #2::    called and informed pt 48M was fine to use and she verbalized understanding and had no other questions Carver Fila  May 04, 2010 3:15 PM

## 2010-05-09 NOTE — Progress Notes (Signed)
Summary: re: letter for vacuum cleaner  Phone Note Call from Patient Call back at Home Phone [phone] P PH     Caller: Patient Call For: wright Summary of Call: pt asks that dr Delford Field add the following to the letter re: vaccum. she says: the Meile vaccum cleaner, Miel filter and 2 bags. pt does not have vaccum cleaner yet. mail letter to her home address. call pt if needed at home # Initial call taken by: Tivis Ringer, CNA,  May 03, 2010 3:07 PM  Follow-up for Phone Call        [facility].Michel Bickers [facility]  May 03, 2010 4:28 PM pt returned call. call cell [phone]. Tivis Ringer, CNA  May 04, 2010 8:45 AM  Spoke with pt and she staes she has not bought the Meile vacuum yet so she needs the following info added to letter written yesterday: The above patient will benefit from a Meile vacuum cleaner, meile filters and Meile bags. Please advsie if ok to add this to letter. Thanks.Carron Curie CMA  May 04, 2010 9:31 AM   Additional Follow-up for Phone Call Additional follow up Details #1::        crystal  I will not do do this, i do not have the time for that if you want to copy paste letter into new space and add ok with me Additional Follow-up by: Storm Frisk MD,  May 04, 2010 1:14 PM    Additional Follow-up for Phone Call Additional follow up Details #2::    Letter completed and placed in PW's to do folder for signature. Gweneth Dimitri RN  May 05, 2010 9:26 AM  Letter signed by Dr. Delford Field.  Pt requesting letter to be mailed to home address -- verified.  Letter placed in mail -- pt aware. Follow-up by: Gweneth Dimitri RN,  May 05, 2010 2:48 PM

## 2010-05-18 NOTE — Progress Notes (Signed)
Summary: asthma/tylenol  Phone Note Call from Patient Call back at Home Phone [phone] Call back at [phone]   Caller: Patient Call For: Daveigh Batty Reason for Call: Talk to Nurse Summary of Call: Patient states she read an article over the weekend which stated that tylenol can cause asthma.  Since she takes a lot of tylenol arthritis could this have had an effect on her asthma dx. Initial call taken by: Lehman Prom,  May 08, 2010 11:27 AM  Follow-up for Phone Call        Called and spoke with pt and she states she read an article in the newspaper over the weekend that states that too much intake of tylenol can icnrease your risk of developing asthma. Pt states she takes extra strength tylenol arthritis 4 tablets a day and has taken thsi for years. Pt wants to know if this could have caused her to have asthma and as well as if she needs to be taking something other than the tylenol. Dr. Delford Field please advise thanks Carver Fila  May 08, 2010 1:34 PM   Additional Follow-up for Phone Call Additional follow up Details #1::        i doubt this has anything to do with her asthma Additional Follow-up by: Storm Frisk MD,  May 08, 2010 1:41 PM    Additional Follow-up for Phone Call Additional follow up Details #2::    called and spoke with pt and she is aware of PW recs of the tylenol.  pt voiced her understanding and stated that she just wanted to make sure before she cont with the tylenol Randell Loop CMA  May 08, 2010 1:56 PM

## 2010-05-18 NOTE — Medication Information (Signed)
Summary: Xolair order/[facility]  Xolair order/[facility]   Imported By: Lester [place] 05/08/2010 10:59:27  _____________________________________________________________________  External Attachment:    Type:   Image     Comment:   External Document

## 2010-05-18 NOTE — Miscellaneous (Signed)
Summary: Xolair  Program  Xolair  Program   Imported By: Lester [place] 05/08/2010 11:06:06  _____________________________________________________________________  External Attachment:    Type:   Image     Comment:   External Document

## 2010-05-29 LAB — POCT HEMOGLOBIN-HEMACUE: Hemoglobin: 11.8 g/dL — ABNORMAL LOW (ref 12.0–15.0)

## 2010-06-01 LAB — POCT HEMOGLOBIN-HEMACUE: Hemoglobin: 12 g/dL (ref 12.0–15.0)

## 2010-06-07 ENCOUNTER — Ambulatory Visit (INDEPENDENT_AMBULATORY_CARE_PROVIDER_SITE_OTHER): Payer: Medicare Other

## 2010-06-07 DIAGNOSIS — J45909 Unspecified asthma, uncomplicated: Secondary | ICD-10-CM

## 2010-06-07 MED ORDER — OMALIZUMAB 150 MG ~~LOC~~ SOLR
150.0000 mg | Freq: Once | SUBCUTANEOUS | Status: AC
  Administered 2010-06-07: 150 mg via SUBCUTANEOUS

## 2010-06-20 ENCOUNTER — Telehealth: Payer: Self-pay | Admitting: Critical Care Medicine

## 2010-06-20 NOTE — Telephone Encounter (Signed)
Per Dr. Kriste Basque, pt should see Dr. Delford Field beofre next scheduled xolair injection. Follow-up appts for PW and xolair are on 5/18 and I have instructed pt to see PW first on that day and he will decide if she should continue on xolair. Per Sn she can use Tylenol PM or try OTC melatonin to help her sleep. Pt understood all instructions and will call if she has any further problems. Will forward to PW so he is aware.

## 2010-06-20 NOTE — Telephone Encounter (Signed)
Pt states she had her 2nd xolair injection on April 18th and has noticed that she feels more fatigued, slight increase in sob and increased hair loss. She would like to know if this is normal when starting xolair or if she should stop medication. She is due for next injection on 5/16. Dr. Delford Field is out of the office and will forward to doc of the day, Dr. Kriste Basque for recs.

## 2010-06-21 NOTE — Telephone Encounter (Signed)
Spoke w/ pt and advised her of this. Pt verbalized understanding and had no further questions

## 2010-06-21 NOTE — Telephone Encounter (Signed)
None of these are side effects of xolair, will discuss at next ov

## 2010-06-25 ENCOUNTER — Telehealth: Payer: Self-pay | Admitting: Pulmonary Disease

## 2010-06-25 NOTE — Telephone Encounter (Signed)
Sick with sore throat, cough , green phlegm Z-pak & prednisone called in to pharmacy

## 2010-07-03 ENCOUNTER — Telehealth: Payer: Self-pay | Admitting: Critical Care Medicine

## 2010-07-03 ENCOUNTER — Encounter: Payer: Self-pay | Admitting: *Deleted

## 2010-07-03 MED ORDER — MOXIFLOXACIN HCL 400 MG PO TABS: 400.0000 mg | ORAL_TABLET | Freq: Every day | ORAL | Status: AC

## 2010-07-03 NOTE — Telephone Encounter (Signed)
Spoke w/ pt and she c/o coughing up thick yellow to green phlem, scratchy throat, chest tightness, ear popping when she swallows. Pt states she has been drinking de caffinated hot tea w/ honey. Pt states she was rx'd pednisone and a zpak 06/24/10 by RA w/ the same symptoms. Pt states she still is not getting any better. Pt is coming in 5/16 at 1:45 to see Dr. Delford Field Please advise Dr. Delford Field. Thanks  Allergies  Allergen Reactions  . Eggs Or Egg-Derived Products   . Hydrocodone-Acetaminophen     REACTION: unspecified  . Levofloxacin     REACTION: GI upset  . Milk-Related Compounds   . Molds & Smuts   . Morphine Sulfate     REACTION: pruritis  . Oxycodone Hcl   . Paroxetine     REACTION: unspecified  . Penicillins     REACTION: rash  . Rofecoxib     REACTION: feet swelling  . Tetracycline Hcl     REACTION: unspecified    Carver Fila, CMA

## 2010-07-03 NOTE — Telephone Encounter (Signed)
Spoke with pt and notified that rx will be sent for avelox. Pt verbalized understanding.

## 2010-07-03 NOTE — Telephone Encounter (Signed)
Call in avelox 400mg /d x 5days 

## 2010-07-04 ENCOUNTER — Encounter: Payer: Self-pay | Admitting: Critical Care Medicine

## 2010-07-04 NOTE — Op Note (Signed)
NAME:  Ebony Scott, Ebony Scott                  ACCOUNT NO.:  [id number]   MEDICAL RECORD NO.:  [id number]          PATIENT TYPE:  AMB   LOCATION:  DSC                          FACILITY:  [facility]   PHYSICIAN:  Katy Fitch. Sypher, M.D. DATE OF BIRTH:  [birth year]   DATE OF PROCEDURE:  08/03/2008  DATE OF DISCHARGE:                               OPERATIVE REPORT   PREOPERATIVE DIAGNOSIS:  Pantrapezial arthritis, left thumb.   POSTOPERATIVE DIAGNOSIS:  Pantrapezial arthritis.   OPERATION:  1. Resection of left trapezium with synovectomy and removal of loose      bodies, left thumb carpometacarpal joint.  2. Suture suspensionplasty with palmaris longus knotted interposition      graft.  3. Flexor carpi radialis intermetacarpal ligament reconstruction with      interposition of remaining flexor carpi radialis tendon tail.   OPERATING SURGEON:  Katy Fitch. Sypher, MD   ASSISTANT:  Marveen Reeks Dasnoit, PA-C   ANESTHESIA:  General by LMA supplemented by a left interscalene block.   SUPERVISING ANESTHESIOLOGIST:  Sheldon Silvan, MD   INDICATIONS:  Roger Shelter. 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 Thyra Breed for evaluation and  management of bilateral thumb pain.   We treated Ms. Pierotti for nearly 2 years with nonoperative management;  however, her pain persisted and she ultimately reached a point where she  was ready to proceed with reconstruction of her thumb carpometacarpal  joints.   She is status post reconstruction of her right thumb carpometacarpal  joint and is quite pleased with her early result.  She requested that a  similar procedure be performed on the left.   On the right, due to the fact that she is right-hand dominant, we did  use a GraftJacket autograft into position.   On the left, she has a significant palmaris longus tendon and we will  likely use her palmaris as an alternative tendon interposition graft.   Preoperatively, she was advised  of potential risks and benefits of  surgery.   She was interviewed by Dr. Ivin Booty in the holding area and had a  preoperative regional block placed to provide postoperative analgesia  for the left upper extremity.   After informed consent, she is brought to the operating room at this  time.   PROCEDURE:  Treana Lacour. Marquis is brought to the operating room and placed in  supine position on the operating table.   Due to a PENICILLIN allergy, she is provided vancomycin 1 g IV slowly  beginning in the holding area.  She was brought to room 8 of the [facility] and placed in supine position on the operating table and  under Dr. Ivin Booty' supervision, general anesthesia by LMA technique  induced.   The left arm was prepped with Betadine soap solution and sterilely  draped.  After exsanguination of left arm with an Esmarch bandage, the  arterial tourniquet was applied to the proximal brachium and inflated to  220 mmHg.  The procedure commenced with a curvilinear Wagner incision  exposing the thenar muscles.  Care was taken to gently retract the  radial superficial sensory branches.  The interval between the extensor  pollicis brevis and abductor pollicis longus tendon slips was sharply  incised and the capsule of the [facility] joint identified.  With great care,  the capsule of the [facility] joint was elevated off the trapezium followed by  subperiosteal exposure of the trapezium.  The trapezium was morselized  and removed piecemeal.  There are large osteophytes presenting between  the thumb and index metacarpals.  After complete synovectomy, the flexor  carpi radialis was identified and carefully protected.   Two drill holes were created, one to the base of the index metacarpal  aiming dorsal ulnarly towards the insertion of the extensor carpi  radialis longus and one to the base of thumb metacarpal with a 45-degree  angle from 1 cm distal to the articular surface on its dorsal side  through the  base of the thumb metacarpal.   One-half of the flexor carpi radialis was harvested from the  musculotendinous junction through a second incision, brought distally  with blunt technique, being split to the insertion at the index  metacarpal and the cavity created by trapezium excision.  The palmaris  longus was harvested with extension of the Wagner incision utilizing a  Brand tendon stripper.  The palmaris longus was stripped off muscle  fiber, knotted repeatedly, creating about 1.2 cubic mL of tendon  material.  This was sutured repeatedly to increase its bulk and  strength.   The flexor carpi radialis was invaginated into the index metacarpal  drill hole using a 3-0 FiberWire suture ultimately anchored to the  extensor carpi radialis longus.  A #1 Vicryl was placed through the  drill hole through the index metacarpal after being secured at the  extensor carpi radialis longus and was used as a suture suspension to  support the thumb metacarpal in an anatomic height.  This suture was  brought through the base of the thumb metacarpal from proximal to distal  and ultimately tensioned and sutured to the periosteum of the thumb  metacarpal.  The flexor carpi radialis free tail was then brought to the  base of thumb metacarpal, sutured also to the thenar musculature and the  periosteum of the thumb metacarpal and the abductor pollicis longus  tendon insertion.  This created a very satisfactory intermetacarpal  ligament reconstruction, maintaining the thumb in an anatomic height.  The tail of flexor carpi radialis was then brought through the knotted  palmaris longus wrapped around the intact flexor carpi radialis, and  used to interpose the knotted tendon graft inferior to the metacarpal  base.  Multiple sutures of 3-0 Ethibond were used to position the  knotted palmaris longus as a proper interposition graft.   After irrigation and ensuring that no bone scrap remained behind, the   capsule was meticulously repaired with figure-of-eight sutures of 3-0  Ethibond.  The insertion of the abductor pollicis longus was reinforced  with the tail of the Vicryl suture at the base of the thumb metacarpal.   The wounds were irrigated and repaired with intradermal 3-0 Prolene and  Steri-Strips.  There were no apparent complications.   Ms. Canlas tolerated the surgery and anesthesia well.  She was placed in a  compressive dressing after release of the tourniquet followed by thumb  spica splint.  She was transferred to recovery room with stable signs.  For aftercare,  she will be provided analgesic medication in the form of p.o. and  IV  Dilaudid with special attention to Benadryl due to the fact she has a  histamine response to narcotic analgesics.  She will also be provided a  second dose of vancomycin in the morning.      Katy Fitch Sypher, M.D.  Electronically Signed     RVS/MEDQ  D:  08/03/2008  T:  08/04/2008  Job:  [id number]   cc:   Thyra Breed, MD  Ernestina Penna, M.D.

## 2010-07-04 NOTE — Assessment & Plan Note (Signed)
[facility]                            CARDIOLOGY OFFICE NOTE   Ebony Scott, Ebony Scott                         MRN:          [id number]  DATE:11/28/2009                            DOB:          [birth year]    PRIMARY CARE PHYSICIAN:  Ernestina Penna, MD, [facility].   HISTORY OF PRESENT ILLNESS:  This is a 60 year old with a history of  hyperthyroidism, prior breast cancer, hyperlipidemia, and irritable  bowel syndrome who presents for cardiology evaluation.  The patient says  that over the last year or so, she has noted shortness of breath with  less exertion.  She is able to walk 1.5 miles at a time for exercise and  she does not get short of breath if she walks slowly.  However, she does  somewhat heavier activity such as carrying groceries up steps or  vacuuming.  She does get significant dyspnea.  This has been  progressively worse especially over the last several months.  She  attributes this to breathing mold while working on a [employer] for  [employer].  She says she has also noted labored breathing when she  has been working on the [employer] for [employer].  She also does have  generalized fatigue.  She thinks this is worse over the last few months  as well.  She had TSH done recently that was normal.  She has not had  any acute illnesses recently except for an upper respiratory infection  in September 2011, for which she was treated with antibiotics and  steroids.  She did have a stress test about 2 years ago that was a  treadmill stress test that was normal.   PAST MEDICAL HISTORY:  1. Hypothyroidism.  2. Chronic neck pain.  3. Gastritis.  4. Prior breast cancer.  5. Depression.  6. History of pulmonary nodules.  7. Cholecystectomy.  8. Hyperlipidemia.  9. Irritable bowel syndrome.  10.Exercise treadmill test 2 years ago that was normal.   FAMILY HISTORY:  The patient father had a stroke.  He also had  bypass  surgery.  She thinks in his 59s.  He had a ruptured AAA.  Mother had  diabetes.  Brother had bypass surgery and heart attack at age of 18.   SOCIAL HISTORY:  The patient is a retired IT consultant.  She  does not smoke.  She does have children.   LABORATORY DATA:  Most recent labs September 2011, LDL 59, HDL 96, TSH  normal.  Her potassium 4.4, creatinine 0.95.  EKG was reviewed today  shows normal sinus rhythm.  It is a normal EKG.   REVIEW OF SYSTEMS:  All systems were reviewed and were negative except  as noted in history of present illness.   CURRENT MEDICATIONS:  1. Propylthiouracil 50 mg b.i.d.  2. Creon.  3. Neurontin.  4. Crestor 10 mg daily.  5. Lovaza.   PHYSICAL EXAMINATION:  VITALS:  Blood pressure 122/64, heart rate 72 and  regular.  GENERAL:  This is a well-developed female, in no apparent distress.  NEUROLOGIC:  Alert and oriented x3.  Normal affect.  NECK:  There is no JVD.  No thyromegaly or thyroid nodule.  HEENT:  Normal exam.  CARDIOVASCULAR:  Heart regular, S1 and S2.  No S3.  No S4.  No murmur.  There is normal PMI, 2+ posterior tibial pulses bilaterally.  No carotid  bruit.  ABDOMEN:  Soft and nontender.  No hepatosplenomegaly.  Normal bowel  sounds.  EXTREMITIES:  No clubbing or cyanosis.  SKIN:  Normal exam.  MUSCULOSKELETAL:  Normal exam.  LUNGS:  Clear to auscultation bilaterally with normal respiratory  effort.   IMPRESSION:  This is a 60 year old with a history of hyperlipidemia and  hyperthyroidism and depression who presents for evaluation of gradually  worsening exertional dyspnea.  She also has significant fatigue.  Her  cardiac risk factors include as mentioned hyperlipidemia.  Also, she  does have a family history of premature coronary artery disease and the  brother had a heart attack when he was about 62.  The patient's lipids  are under good control with an LDL of 59 and HDL of 96.  We will  continue her on her current  doses of Crestor and Lovaza.  She does have  rather nonspecific symptoms.  However, she does feel like she has  developed more exertional dyspnea that occurs with fairly heavy exertion  and certainly this could be due to deconditioning and weight gain.  Also, it is possible that she could have had an allergic reaction to  mold [employer].  She has been working on.  However,  given her risk factors, I do think it would be reasonable to do a stress  echocardiogram to make sure that she does not have evidence for coronary  artery disease.  We will set this up for her.     Marca Ancona, MD     DM/MedQ  DD: 11/28/2009  DT: 11/29/2009  Job #: [id number]   cc:   Ernestina Penna, M.D.

## 2010-07-04 NOTE — Op Note (Signed)
NAME:  Ebony Scott, Ebony Scott                  ACCOUNT NO.:  [id number]   MEDICAL RECORD NO.:  [id number]          PATIENT TYPE:  AMB   LOCATION:  DSC                          FACILITY:  [facility]   PHYSICIAN:  Katy Fitch. Sypher, M.D. DATE OF BIRTH:  07-15-1950   DATE OF PROCEDURE:  04/20/2008  DATE OF DISCHARGE:                               OPERATIVE REPORT   PREOPERATIVE DIAGNOSIS:  End-stage pantrapezial arthrosis right thumb  carpometacarpal, scaphotrapeziotrapezoid and trapezium-trapezoid joint.   POSTOPERATIVE DIAGNOSIS:  End-stage pantrapezial arthrosis right thumb  carpometacarpal, scaphotrapeziotrapezoid and trapezium-trapezoid joint  with identification of multiple loose bodies.   OPERATION:  1. Excision of right trapezium.  2. Synovectomy and removal of multiple loose bodies from      carpometacarpal joint right thumb.  3. Reconstruction of intermetacarpal ligament between index metacarpal      and thumb metacarpal utilizing a distally based one-half flexor      carpi radialis tendon graft harvested at the musculotendinous      junction.  4. A 4 x 2 cm jelly roll graft jacket into position with augmentation      with the free tail of the flexor carpi radialis.   SURGEON:  Katy Fitch. Sypher, MD   ASSISTANT:  Annye Rusk, PA-C   ANESTHESIA:  General by LMA supplemented by a right infraclavicular  block.   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   She has had a chronically painful right thumb CMC joint for more than 3  years.   We have had a lengthy dialogue regarding a thumb pain for 2 years.  She  was initially referred by her primary pain management physician, Dr.  Thyra Breed for evaluation and management of her hands.  We had  successfully treated her thumb pain for 2 years with serial steroid  injection, splinting and activity  modification.   Ebony Scott has multiple consequential drug allergies and has had  difficulties managing her pain with medication.  She ultimately  requested that we proceed with reconstruction of her thumb at this time.   Preoperatively, we advised her that we would perform a conventional  trapeziectomy, intermetacarpal ligament reconstruction and essentially a  hematoma distraction arthroplasty.  However, given her history of  chronic pain and inability to tolerate nonsteroidal medication, I  suggested that we also had a graft jacket interposition arthroplasty  secured to the trapezoid with a suture anchor.   This was in anticipation of a possible gradual attenuation of  intermetacarpal ligament and with few treatment options for her pain, we  essentially are performing a reconstruction using a belt, suspenders and  glue.  We will create a fall back position should she have a failure of  her intermetacarpal ligament reconstruction.   After informed consent, she is brought to the operating room at this  time.   PROCEDURE:  Ebony Scott is brought to the operating room and placed in  supine position upon the operating table.   Dr.  Crews had placed an infraclavicular block in the holding area.  She  was brought to room 8 of the [facility], placed in supine  position upon the operating table and under Dr. Ivin Booty direct  supervision, general anesthesia by LMA technique induced.   The right arm was prepped with Betadine soap solution and sterilely  draped.  A pneumatic tourniquet was applied to the proximal right  brachium.  Following exsanguination of the right arm with an Esmarch  bandage, the arterial tourniquet was inflated to 220 mmHg.  Vancomycin 1  gram had been started in the holding area as a prophylactic antibiotic  and was being dipped in over 90 minutes.   The procedure commenced with exposure of the carpometacarpal joint with  a standard Wagner incision.  Care was  taken to identify the abductor  pollicis longus tendon slips, the radial superficial sensory branches  and the extensor pollicis brevis.  The interval between the extensor  pollicis brevis and abductor pollicis longus was elevated sharply,  exposing the carpometacarpal joint of the thumb.  The trapezium was  exposed subperiosteally, morselized with an osteotome and removed in  piecemeal with a rongeur.   Multiple loose bodies were extracted from the carpometacarpal joint.  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   After complete trapeziectomy, the wound was inspected for ligament  injury and necrotic ligament was removed as well as remnants from prior  steroid injections.   A 3-mm drill hole was created through the base of the index metacarpal  through the articular facet for the thumb and a similar drill hole  created through the base of the thumb metacarpal.  These were started  with a bone awl and enlarged to 3-mm with sequential hand drilling.  A  one-half flexor carpi radialis tendon graft measuring 5 mm in width was  harvested from the musculotendinous junction and brought with a Noralyn Pick  tendon-passing forceps into the cavity created by trapeziectomy.  It was  split to its insertion at the index metacarpal and invaginated into a  drill hole in the metacarpal with a 3-0 FiberWire suture attached to the  extensor carpi radialis longus dorsally.  This invaginated the flexor  carpi radialis a centimeter into the index metacarpal creating a stable  anchor followed by threading of the distal tail of the flexor carpi  radialis through the base of the thumb metacarpal from proximal to  distal looping back and through the abductor pollicis longus insertion.  This was tensioned to provide a suspension plasty in intermetacarpal  ligament.   The free tail will be incorporated with a graft jacket construct.   A  Biomet suture anchor was then placed with great care into the  trapezoid to anchor a jelly roll anchovy of graft jacket.  A 2 x 4 cm  graft jacket was soaked for 16 minutes of sterile saline followed by  rolling it over triple thickness and rolling it into a jelly roll.  This  was secured with corner sutures of 3-0 Ethibond and then placed with the  two-tailed Biomet anchor against the trapezoid beneath the thumb  metacarpal and deep to the flexor carpi radialis intact tendon.   This was properly positioned and secured by knotting the Biomet anchor.  The flexor carpi radialis was then woven around itself back through a lo  left in the suture on the graft jacket and secured beneath the  thumb  metacarpal with a second set of square knots securing the anchovy  directly beneath the thumb metacarpal.   A very satisfactory construct was achieved with a 1.5 cubic centimeter  graft jacket interposition material and the remnant of the tail of the  flexor carpi radialis woven around itself and secured to the graft  jacket with suture.  A very satisfactory soft tissue interposition that  should revascularize was created.  Very satisfactory suspension of the  thumb and an anatomic height was achieved.   The wound was irrigated followed by repair of the periosteum and closure  of the CMC capsule with interrupted suture of 3-0 Ethibond.  The skin  wounds were then repaired with intradermal 3-0 Prolene and Steri-Strips.  Ms. Pruden was placed in compressive dressing with Xeroflo sterile gauze  and a thumb spica splint.  There were no apparent complications.   I had a lengthy interview with Ms. Haldeman prior to surgery in the holding  area.  We tried to identify pain medications that would be tolerable for  her.  She reported experiencing no nausea or difficult side effects on  Tylenol with Codeine No. 3.   I advised her that we will try Tylenol with Codeine No. 3 as a primary  pain medication and will  consider using Dilaudid with premedication of  Benadryl.   Her medical records do document that she has received Dilaudid both  orally and IV without serious consequence despite the fact that it is  listed on her chart.  She does not have discrete recollection of her  response to Dilaudid in the past.   We will not provide additional antibiotics due to her multiple drug  allergies but she should be adequately covered with 1 gram of vancomycin  preoperatively.      Katy Fitch Sypher, M.D.  Electronically Signed     RVS/MEDQ  D:  04/20/2008  T:  04/21/2008  Job:  [id number]   cc:   Loraine Leriche L. Vear Clock, M.D.

## 2010-07-04 NOTE — Op Note (Signed)
Ebony Scott, Ebony Scott                  ACCOUNT NO.:  [id number]   MEDICAL RECORD NO.:  [id number]          PATIENT TYPE:  OIB   LOCATION:  0098                         FACILITY:  [facility]   PHYSICIAN:  Velora Heckler, MD      DATE OF BIRTH:  08-27-1950   DATE OF PROCEDURE:  06/11/2007  DATE OF DISCHARGE:                               OPERATIVE REPORT   PREOPERATIVE DIAGNOSIS:  Symptomatic cholelithiasis.   POSTOPERATIVE DIAGNOSIS:  Symptomatic cholelithiasis.   PROCEDURE:  Laparoscopic cholecystectomy with intraoperative  cholangiography.   SURGEON:  Velora Heckler, MD, FACS   ASSISTANT:  Anselm Pancoast. Zachery Dakins, MD, FACS   ANESTHESIA:  General.   ESTIMATED BLOOD LOSS:  Minimal.   PREPARATION:  Betadine.   COMPLICATIONS:  None.   INDICATIONS:  The patient is a 60 year old white female from [place],  [place].  She developed nausea in November 2008.  She has  experienced a 15-20 pound weight loss over 4 months.  She had an  extensive workup including laboratory studies, abdominal ultrasound, CT  scan, and upper endoscopy.  The only significant pathologic finding was  cholelithiasis.  The patient was referred to general surgery for  cholecystectomy.   BODY OF REPORT:  The procedure is done in OR #11 at the [facility].  The patient is brought to the operating room,  placed in a supine position on the operating room table.  Following  administration of general anesthesia, the patient is prepped and draped  in the usual strict aseptic fashion.  After ascertaining that an  adequate level of anesthesia had been achieved, a previous  infraumbilical incision is reopened with a #15 blade.  Dissection is  carried through subcutaneous tissues.  Fascia is incised in the midline  and the peritoneal cavity is entered cautiously.  There are a few  adhesions to the anterior abdominal wall, which are gently taken down.  A 0 Vicryl pursestring suture is placed in the  fascia.  A Hasson cannula  is introduced and secured with a pursestring suture.  Abdomen is  insufflated with carbon dioxide.  Laparoscope is introduced and the  abdomen explored.  Operative ports are placed along the right costal  margin in the midline, midclavicular line, and the anterior axillary  line.  Fundus of the gallbladder is grasped and retracted cephalad.  There are no significant adhesions to the gallbladder.  Peritoneum is  incised at the neck of the gallbladder and cystic duct is dissected out  along its length.  A clip is placed at the neck of the gallbladder.  Cystic duct is incised and clear yellow bile emanates from the cystic  duct.  A Cook cholangiography catheter is introduced through a stab  wound in the right upper quadrant.  It is inserted into the cystic duct  and secured with a Ligaclip.  Using C-arm fluoroscopy, real-time  cholangiography was performed.  There is rapid filling of a normal-  caliber common bile duct.  There is free flow distally into the duodenum  without filling defect or obstruction.  There  is reflux of contrast into  the right and left hepatic ductal systems.  The clip is withdrawn and  Cook catheter is removed from the peritoneal cavity.  The cystic duct is  triply clipped and divided.  The cystic artery is dissected out, doubly  clipped, and divided.  Gallbladder is then excised from the gallbladder  bed using the hook electrocautery for hemostasis.  Gallbladder is placed  in an EndoCatch bag and withdrawn through the umbilical port without  difficulty.  It contains multiple gallstones.  The right upper quadrant  is irrigated with warm saline, which is evacuated.  Good hemostasis is  noted.  Ports are removed under direct vision and good hemostasis was  noted at all port sites.  Pneumoperitoneum is released.  The 0 Vicryl  pursestring suture at the umbilicus is tied securely.  All wounds are  anesthetized with local anesthetic.  All wounds  are closed with  interrupted 4-0 Monocryl subcuticular sutures.  The wounds are washed  and dried and Benzoin and Steri-Strips are applied.  Sterile dressings  are applied.  The patient is awakened from anesthesia and brought to the  recovery room in stable condition.  The patient tolerated the procedure  well.      Vel[place] Heckler, MD  Electronically Signed     TMG/MEDQ  D:  06/11/2007  T:  06/11/2007  Job:  [id number]   cc:   Bernette Redbird, M.D.  Fax: [phone]   Ernestina Penna, M.D.  Fax: [phone]   Cordelia Pen A. Rosalio Macadamia, M.D.  Fax: [phone]

## 2010-07-05 ENCOUNTER — Encounter: Payer: Self-pay | Admitting: *Deleted

## 2010-07-05 ENCOUNTER — Encounter: Payer: Self-pay | Admitting: Critical Care Medicine

## 2010-07-05 ENCOUNTER — Ambulatory Visit (INDEPENDENT_AMBULATORY_CARE_PROVIDER_SITE_OTHER): Payer: Medicare Other

## 2010-07-05 ENCOUNTER — Ambulatory Visit (INDEPENDENT_AMBULATORY_CARE_PROVIDER_SITE_OTHER): Payer: Medicare Other | Admitting: Critical Care Medicine

## 2010-07-05 DIAGNOSIS — J45909 Unspecified asthma, uncomplicated: Secondary | ICD-10-CM

## 2010-07-05 DIAGNOSIS — J309 Allergic rhinitis, unspecified: Secondary | ICD-10-CM

## 2010-07-05 MED ORDER — AZELASTINE HCL 0.15 % NA SOLN: 2.0000 | Freq: Every day | NASAL | Status: DC

## 2010-07-05 MED ORDER — OMALIZUMAB 150 MG ~~LOC~~ SOLR
150.0000 mg | Freq: Once | SUBCUTANEOUS | Status: AC
  Administered 2010-07-05: 150 mg via SUBCUTANEOUS

## 2010-07-05 MED ORDER — MOMETASONE FUROATE 50 MCG/ACT NA SUSP: 2.0000 | Freq: Every day | NASAL | Status: DC

## 2010-07-05 NOTE — Patient Instructions (Signed)
Finish avelox [employer] three times a day   Until off avelox No other medication changes Return 2 months

## 2010-07-05 NOTE — Progress Notes (Signed)
Subjective:    Patient ID: Ebony Scott, female    DOB: 1950/12/26, 60 y.o.   MRN: [id number]  HPI 60 y.o. WF atopic moderate persistent asthma, elevated IgE. Started Xolair 05/03/10 Initial Pulmonary Consultation 12/23/09   February 27, 2010 11:45 AM Pt still with nose bleeds and gets out clots and is hoarse Pt is worse since stopped dulera Pt was improving on dulera first of jan.   Pt notes pfr 200s  then increased to close to 300 while on dulera but not able to tolerate dulera.  did reduce dose to 1 puff two times a day on dulera wiht less jitteriness.  March 09, 2010 2:29 PM Still dyspneic,  used qvar last night.  was ok on dulera. symbicort>>qvar>>>dulera>>qvar no time off  home assessment: no mold found.  windex issue,  liquids or wipes better vs powders ,  mask if dust and vacuums,  hepafilters on vacuums, washed with soap and water  vacuum,  mattress issues  qvar does not cause as much throat irritation  as dulera   May 03, 2010 2:18 PM Since January, still dyspneic,  now pulled back muscle.  Not doing much. some cough, no mucus.  Pt is dyspneic  dulera one puff daily.  Now is better on dulera but not as good on twice daily but is less hoarse on one puff daily Uses reclast once a year, usu dec.  Had breast CA in 1998 moderate persistent asthma    -FeV1 72% 2011    -IgE 102 2011    -CT sinus Neg 2011 07/06/2010 Pt first got ill about 06/23/10 with increase cough ?allergies.  Then by 06/25/10 worse, Called in and [facility] called in zpak and pred. Pt not much better with that , then expanded to avelox x 5 days.   Prior to this PFR 250-290.   Still with dyspnea.   Review of Systems Constitutional:   No  weight loss, night sweats,  Fevers, chills, fatigue, lassitude. HEENT:   No headaches,  Difficulty swallowing,  Tooth/dental problems,  Sore throat,                No sneezing, itching, ear ache, nasal congestion, post nasal drip,   CV:  No chest pain,  Orthopnea, PND, swelling in  lower extremities, anasarca, dizziness, palpitations  GI  No heartburn, indigestion, abdominal pain, nausea, vomiting, diarrhea, change in bowel habits, loss of appetite  Resp: No shortness of breath with exertion or at rest.  No excess mucus, no productive cough,  No non-productive cough,  No coughing up of blood.  No change in color of mucus.  No wheezing.  No chest wall deformity  Skin: no rash or lesions.  GU: no dysuria, change in color of urine, no urgency or frequency.  No flank pain.  MS:  No joint pain or swelling.  No decreased range of motion.  No back pain.  Psych:  No change in mood or affect. No depression or anxiety.  No memory loss.     Objective:   Physical Exam Filed Vitals:   07/05/10 1359  BP: 134/68  Pulse: 75  Temp: 98.1 F (36.7 C)  TempSrc: Oral  Height: 5\' 1"  (1.549 m)  Weight: 111 lb 9.6 oz (50.621 kg)  SpO2: 98%    Gen: Pleasant, well-nourished, in no distress,  normal affect  ENT: No lesions,  mouth clear,  oropharynx clear, mod  postnasal drip, nares with nasal inflammation  Neck: No JVD, no TMG,  no carotid bruits  Lungs: No use of accessory muscles, no dullness to percussion, exp wheezes, poor airflow  Cardiovascular: RRR, heart sounds normal, no murmur or gallops, no peripheral edema  Abdomen: soft and NT, no HSM,  BS normal  Musculoskeletal: No deformities, no cyanosis or clubbing  Neuro: alert, non focal  Skin: Warm, no lesions or rashes      PFT Conversion 01/23/2010  FVC   FVC PREDICT 2.920  FVC  % Predicted 69.78  FEV1   FEV1 PREDICT 2.251  FEV % Predicted 72.77  FEV1/FVC   FEV1/FVC PRE 77.846  FEV1/FVC%EXP 103.280  FeF 25-75   FEF % EXPEC 79.429  PFT RSLT     Assessment & Plan:   Asthma with allergic rhinitis Moderate persistent asthma with flare d/t recent tracheobronchitis exacerbated by allergic rhinitis and nasal inflammation Plan 5days avelox 400mg /d Align tid while on abx then qd No change in inhaled or  maintenance medications. Return in   2months      Updated Medication List Outpatient Encounter Prescriptions as of 07/05/2010  Medication Sig Dispense Refill  . acetaminophen (TYLENOL) 500 MG tablet 2 tablet 2 times a day      . Aloe-Sodium Chloride (AYR SALINE NASAL GEL NA) by Nasal route. Per box        . Alpha-D-Galactosidase (BEANO) TABS Take by mouth. As needed      . Alpha-Lipoic Acid 600 MG CAPS Take by mouth. Once a day       . Alum & Mag Hydroxide-Simeth (MAGIC MOUTHWASH) SOLN Take by mouth. Gargle and swallow 1 tsp 4 times a day as needed       . Artificial Tear Ointment (REFRESH P.M. OP) Apply to eye. Apply small amount under eye lids at bedtime       . Calcium Citrate-Vitamin D (CITRACAL MAXIMUM) 315-250 MG-UNIT TABS Take by mouth. 1 capsule twice a day       . Cholecalciferol (VITAMIN D) 2000 UNITS CAPS Take by mouth. 1/2 at bedtime       . EPINEPHrine (EPIPEN 2-PAK) 0.3 mg/0.3 mL DEVI As directed       . fexofenadine (ALLEGRA) 180 MG tablet Take 180 mg by mouth daily.        Marland Kitchen gabapentin (NEURONTIN) 600 MG tablet Once a day       . Humidifiers (COOL MIST HUMIDIFIER) MISC by Does not apply route. At bedtime       . hydrocortisone (PROCTOZONE-HC) 2.5 % rectal cream As needed       . lactase (LACTAID) 3000 UNITS tablet 3-4 tabs with dairy liquids and solids       . lidocaine (LIDODERM) 5 % Place 1 patch onto the skin daily as needed. Remove & Discard patch within 12 hours or as directed by MD      . Melatonin 3 MG CAPS Take by mouth. 3 tabs at bedtime      . Mometasone Furo-Formoterol Fum (DULERA) 100-5 MCG/ACT AERO Inhale into the lungs. 1 puff daily       . moxifloxacin (AVELOX) 400 MG tablet Take 1 tablet (400 mg total) by mouth daily.  5 tablet  0  . neomycin-bacitracin-polymyxin (TRIPLE ANTIBIOTIC) ophthalmic ointment Apply as needed       . nystatin-triamcinolone (MYCOLOG II) cream Apply as needed       . omalizumab (XOLAIR) 150 MG injection 150 SQ every 4 weeks      .  Pancrelipase, Lip-Prot-Amyl, (CREON) 24000 UNITS CPEP Take by mouth.  1 with each meal       . Polyethyl Glycol-Propyl Glycol (SYSTANE ULTRA) 0.4-0.3 % SOLN Apply to eye. 1-2 drops each eye every morning and at bedtime       . Prenatal Vit-DSS-Fe Cbn-FA (VINATE ULTRA) TABS Take by mouth. Once a day       . Probiotic Product (ALIGN) 4 MG CAPS Take by mouth. Once a day       . propylthiouracil (PTU) 50 MG tablet 1 tablet twice a day       . pseudoephedrine-guaifenesin (MUCINEX D) 60-600 MG per tablet Take 1 tablet by mouth every 12 (twelve) hours.        . rosuvastatin (CRESTOR) 20 MG tablet Take 10 mg by mouth daily.        . Simethicone (GAS-X EXTRA STRENGTH) 125 MG CAPS Take by mouth. Per box       . zoledronic acid (RECLAST) 5 MG/100ML SOLN Once a year       . Azelastine HCl (ASTEPRO) 0.15 % SOLN 2 sprays by Nasal route daily.  30 mL  6  . metaxalone (SKELAXIN) 800 MG tablet 2-4 times daily as needed       . mometasone (NASONEX) 50 MCG/ACT nasal spray 2 sprays by Nasal route daily.  17 g  6  . DISCONTD: diphenhydrAMINE (BENADRYL) 25 mg capsule 1 tablet twice a day       . DISCONTD: guaiFENesin (MUCINEX) 600 MG 12 hr tablet Take 600 mg by mouth 2 (two) times daily.        Marland Kitchen DISCONTD: Naproxen Sodium 220 MG CAPS Take by mouth. 2-3 tablets as needed       . DISCONTD: pseudoephedrine-guaifenesin (MUCINEX D) 60-600 MG per tablet 1 tablet twice a day as needed       . DISCONTD: Tea Tree Oil OIL by Does not apply route. Apply as needed        No facility-administered encounter medications on file as of 07/05/2010.

## 2010-07-06 NOTE — Assessment & Plan Note (Addendum)
Moderate persistent asthma with flare d/t recent tracheobronchitis exacerbated by allergic rhinitis and nasal inflammation Plan 5days avelox 400mg /d Align tid while on abx then qd No change in inhaled or maintenance medications. Return in   2months

## 2010-07-07 NOTE — Op Note (Signed)
[facility]  Patient:    Ebony Scott, Ebony Scott Visit Number: [id number] MRN: [id number]          Service Type: END Location: ENDO Attending Physician:  Rich Brave Dictated by:   Florencia Reasons, M.D. Proc. Date: 02/10/01 Admit Date:  02/10/2001   CC:         Cordelia Pen A. Rosalio Macadamia, M.D.   Operative Report  PROCEDURE:  Colonoscopy.  INDICATION:  This is a 60 year old female with intermittent rectal bleeding.  FINDINGS:  Small internal hemorrhoids.  DESCRIPTION OF PROCEDURE:  The nature, purpose, and risks of the procedure have been discussed with the patient and provided written consent.  SEDATION:  Fentanyl 100 mcg and Versed 10 mg IV prior to and during the course of the procedure without arrhythmias or desaturation.  The Olympus adjustable tension pediatric video colonoscope was advanced in a cautious fashion around the colon, taking out loops as I went, so as to conserve scope and reduce trauma to the bowel wall. Ultimately, the cecum was reached and the small intestine was entered for a distance of perhaps 15 cm after which pullback was performed. The quality of the prep was excellent so it was felt that all areas were seen.  This was a normal examination. No polyps, cancer, colitis, vascular malformations, or diverticulosis were noted. Retroflexion in the rectum was basically normal. Prominent hypertrophy to anal papillae and mild to moderate internal hemorrhoids were observed during pullout through the anal canal.  The patient tolerated the procedure well and there were no apparent complications.  IMPRESSION:  Internal hemorrhoids, otherwise normal screening colonoscopy.  PLAN:  I would consider a followup screening colonoscopy in five years, especially if the patient continues to have small volume hematochezia. Dictated by:   Florencia Reasons, M.D. Attending Physician:  Rich Brave DD:  02/10/01 TD:   02/11/01 Job: [id number]

## 2010-07-07 NOTE — Op Note (Signed)
[place]  Patient:    Ebony Scott, Ebony Scott Visit Number: [id number] MRN: [id number]          Service Type: DSU Location: 3000 [id number] Attending Physician:  Donn Pierini Dictated by:   Julio Sicks, M.D. Proc. 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  OPERATION PERFORMED:  C3-4 anterior cervical diskectomy and fusion with allograft and anterior plating.  SURGEON:  Julio Sicks, M.D.  ASSISTANT:  Reinaldo Meeker, M.D.  ANESTHESIA:  General orotracheal.  INDICATIONS FOR PROCEDURE:  The patient is a 60 year old female who has been suffering neck pain chronically for greater than one year failing all conservative management.  MRI scanning demonstrates evidence of small central disk herniation at C3-4 with some associated spondylosis.  There was high signal posteriorly within her posterior cervical ligaments.  On bending films, the patient has evidence of ligamentous instability at C3-4 with 3 mm of subluxation between flexion and extension.  She also has evidence of sclerosis of her facet joints at C3-4 also suggestive of instability.  We discussed options available for management including the possibility of undergoing C3-4 anterior cervical diskectomy and fusion, allograft anterior plating for hopeful improvement of her symptoms.  The patient is aware of her risks and benefits and wishes to proceed.  DESCRIPTION OF PROCEDURE:  The patient was taken to the operating room and placed on the operating table in supine position.  After an adequate level of anesthesia was achieved, the patient was positioned supine with the neck slightly extended and held in place with halter traction.  The patients anterior cervical region was shaved  and prepped sterilely.  A 10 blade was used to make a linear incision overlying the C3-4 interspace.  This was carried down sharply to the platysma.  The platysma was then divided vertically and dissection proceeded along the medial border of the sternocleidomastoid muscle and carotid sheath on the right.  The trachea and esophagus were mobilized and retracted towards the left.  Prevertebral fascia was stripped off the anterior spinal column.  The longus colli muscles were then elevated bilaterally using electrocautery.  Deep self-retaining retractor was placed.  Intraoperative fluoroscopy was used and the C3-4 level was confirmed.  The disk space was then incised with a 15 blade in a rectangular fashion.  A wide disk space clean-out was then achieved using pituitary rongeurs, forward and backward angled Carlens curets, Kerrison rongeurs and a high speed drill.  All elements of the disk were removed down to the posterior annulus.  Microscope was brought into the field and used throughout the remainder of the diskectomy.  The remaining aspects of annulus and osteophytes were removed using a high speed drill down to the level of the posterior longitudinal ligament.  The posterior longitudinal ligament was then elevated and resected in piecemeal fashion using Kerrison rongeurs.  The underlying thecal sac was identified.  A decompression then proceeded out to the neural foramina bilaterally.  Both C4 nerve roots were identified and found to be free of any compression.  A wide central decompression was achieved.  There was no evidence of injury to thecal sac or nerve roots.  The wound was then  copiously irrigated with antibiotic solution.  Gelfoam was placed topically for hemostasis which was found to be good.  A 7 mm patellar wedge allograft was then impacted into place, recessed approximately 1 mm from the anterior vertical surfaces.  The microscope was removed.  A 21 mm Atlantis  anterior cervical plate was then placed over the C3 and C4 level.  It was then attached under fluoroscopic guidance by using two 13 mm variable angle screws at C3 and at C4.  All four screws were given a final tightening and found to be solidly within bone.  Locking screws were engaged at both levels.  Retraction system was then removed.  Final images revealed good position of the bone grafts and hardware at the proper operative level with normal alignment of the spine.  Wound was inspected for hemostasis which was found to be good.  It was then irrigated one final time and then closed in a routine fashion. Steri-Strips and sterile dressings were then applied.  There were no apparent complications.  The patient tolerated the procedure well and he returned to the recovery room postoperatively. Dictated by:   Julio Sicks, M.D. Attending Physician:  Donn Pierini DD:  03/10/01 TD:  03/11/01 Job: [id number]

## 2010-07-10 ENCOUNTER — Other Ambulatory Visit: Payer: Self-pay | Admitting: Obstetrics and Gynecology

## 2010-07-10 DIAGNOSIS — Z1231 Encounter for screening mammogram for malignant neoplasm of breast: Secondary | ICD-10-CM

## 2010-07-18 ENCOUNTER — Telehealth: Payer: Self-pay | Admitting: Critical Care Medicine

## 2010-07-18 NOTE — Telephone Encounter (Signed)
Pt states she has noticed pain in her back and neck area since she took her xolair shot on 5/16, pt states any area she has arthritis has been more painful, also pt states she has a dry cough occ she will get something up but not often at all when she does it is mostly clear, she would like to know your opinion on volunteering at the hospital. Pt aware dr Delford Field not back until tomorrow and she is fine to wait until then--pls advise

## 2010-07-18 NOTE — Telephone Encounter (Signed)
Ok to Agricultural consultant and this is NOT due to [employer].  True/true  But unrelated

## 2010-07-19 NOTE — Telephone Encounter (Signed)
Pt aware of recs. Ebony Scott, CMA  

## 2010-08-02 ENCOUNTER — Ambulatory Visit (INDEPENDENT_AMBULATORY_CARE_PROVIDER_SITE_OTHER): Payer: Medicare Other

## 2010-08-02 DIAGNOSIS — J45909 Unspecified asthma, uncomplicated: Secondary | ICD-10-CM

## 2010-08-02 MED ORDER — OMALIZUMAB 150 MG ~~LOC~~ SOLR
150.0000 mg | Freq: Once | SUBCUTANEOUS | Status: AC
  Administered 2010-08-02: 150 mg via SUBCUTANEOUS

## 2010-08-09 ENCOUNTER — Encounter: Payer: Self-pay | Admitting: Family Medicine

## 2010-08-30 ENCOUNTER — Ambulatory Visit: Payer: Medicare Other | Admitting: Critical Care Medicine

## 2010-08-30 ENCOUNTER — Ambulatory Visit: Payer: Medicare Other

## 2010-09-04 ENCOUNTER — Ambulatory Visit (INDEPENDENT_AMBULATORY_CARE_PROVIDER_SITE_OTHER): Payer: Medicare Other

## 2010-09-04 DIAGNOSIS — J45909 Unspecified asthma, uncomplicated: Secondary | ICD-10-CM

## 2010-09-04 MED ORDER — OMALIZUMAB 150 MG ~~LOC~~ SOLR
150.0000 mg | Freq: Once | SUBCUTANEOUS | Status: AC
  Administered 2010-09-04: 150 mg via SUBCUTANEOUS

## 2010-09-08 ENCOUNTER — Ambulatory Visit: Payer: Medicare Other

## 2010-09-14 ENCOUNTER — Other Ambulatory Visit: Payer: Self-pay | Admitting: Critical Care Medicine

## 2010-09-18 ENCOUNTER — Ambulatory Visit
Admission: RE | Admit: 2010-09-18 | Discharge: 2010-09-18 | Disposition: A | Discharge status: Home / Self Care | Payer: Medicare Other | Source: Ambulatory Visit | Attending: Obstetrics and Gynecology | Admitting: Obstetrics and Gynecology

## 2010-09-18 DIAGNOSIS — Z1231 Encounter for screening mammogram for malignant neoplasm of breast: Secondary | ICD-10-CM

## 2010-09-20 ENCOUNTER — Ambulatory Visit (INDEPENDENT_AMBULATORY_CARE_PROVIDER_SITE_OTHER): Payer: Medicare Other | Admitting: Critical Care Medicine

## 2010-09-20 ENCOUNTER — Encounter: Payer: Self-pay | Admitting: *Deleted

## 2010-09-20 ENCOUNTER — Encounter: Payer: Self-pay | Admitting: Critical Care Medicine

## 2010-09-20 VITALS — BP 120/68 | HR 78 | Temp 98.5°F | Ht 60.0 in | Wt 113.8 lb

## 2010-09-20 DIAGNOSIS — J45909 Unspecified asthma, uncomplicated: Secondary | ICD-10-CM

## 2010-09-20 MED ORDER — MOMETASONE FURO-FORMOTEROL FUM 100-5 MCG/ACT IN AERO: 1.0000 | INHALATION_SPRAY | Freq: Two times a day (BID) | RESPIRATORY_TRACT | Status: DC

## 2010-09-20 NOTE — Progress Notes (Signed)
Subjective:    Patient ID: Ebony Scott, female    DOB: 07-Jul-1950, 60 y.o.   MRN: [id number]  HPI  60 y.o. WF atopic moderate persistent asthma, elevated IgE. Started Xolair 05/03/10 Initial Pulmonary Consultation 12/23/09   February 27, 2010 11:45 AM Pt still with nose bleeds and gets out clots and is hoarse Pt is worse since stopped dulera Pt was improving on dulera first of jan.   Pt notes pfr 200s  then increased to close to 300 while on dulera but not able to tolerate dulera.  did reduce dose to 1 puff two times a day on dulera wiht less jitteriness.  March 09, 2010 2:29 PM Still dyspneic,  used qvar last night.  was ok on dulera. symbicort>>qvar>>>dulera>>qvar no time off  home assessment: no mold found.  windex issue,  liquids or wipes better vs powders ,  mask if dust and vacuums,  hepafilters on vacuums, washed with soap and water  vacuum,  mattress issues  qvar does not cause as much throat irritation  as dulera   May 03, 2010 2:18 PM Since January, still dyspneic,  now pulled back muscle.  Not doing much. some cough, no mucus.  Pt is dyspneic  dulera one puff daily.  Now is better on dulera but not as good on twice daily but is less hoarse on one puff daily Uses reclast once a year, usu dec.  Had breast CA in 1998 moderate persistent asthma    -FeV1 72% 2011    -IgE 102 2011    -CT sinus Neg 2011 07/05/10 Pt first got ill about 06/23/10 with increase cough ?allergies.  Then by 06/25/10 worse, Called in and [facility] called in zpak and pred. Pt not much better with that , then expanded to avelox x 5 days.   Prior to this PFR 250-290.   Still with dyspnea.   09/20/2010 ? If better or worse.  PFR 250-300    Still feels dyspneic exertion only.  Still fatigued.  Code Orange stays inside.  Tries to water the plants. Coughs daily worse in the evenings.  7/29 had excess mucus only time. Lots of fatigue after xolair and achiness On Xolair x 5 months.  Past Medical History  Diagnosis  Date  . Chronic neck pain   . Allergic rhinitis   . Moderate persistent asthma     -FeV1 72% 2011, -IgE 102 2011, CT sinus Neg 2011  . Breast cancer     in remission  . GERD (gastroesophageal reflux disease)   . Hypothyroidism   . Osteoporosis     on reclast yearly  . Hiatal hernia   . Diverticulosis   . Hemorrhoids   . Asthma   . IBS (irritable bowel syndrome)      Family History  Problem Relation Age of Onset  . Allergies Mother   . Allergies Father   . Heart disease Mother   . Heart disease Father   . Arthritis Mother   . Arthritis Father   . Lung cancer Mother   . Colon cancer      Maternal half uncle  . Colon cancer      Maternal half aunt  . Colitis Daughter      History   Social History  . Marital Status: Divorced    Spouse Name: N/A    Number of Children: N/A  . Years of Education: N/A   Occupational History  . 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 Smoking status: Never Smoker   . Smokeless tobacco: Never Used  . Alcohol Use: 1.2 oz/week    2 Glasses of wine per week  . Drug Use: Not on file  . Sexually Active: Not on file   Other Topics Concern  . Not on file   Social History Narrative  . No narrative on file     Allergies  Allergen Reactions  . Dust Mite Extract   . Eggs Or Egg-Derived Products   . Hydrocodone-Acetaminophen     REACTION: unspecified  . Levofloxacin     REACTION: GI upset  . Milk-Related Compounds   . Molds & Smuts   . Morphine Sulfate     REACTION: pruritis  . Oxycodone Hcl   . Paroxetine     REACTION: unspecified  . Penicillins     REACTION: rash  . Rofecoxib     REACTION: feet swelling  . Shrimp Flavor   . Strawberry   . Tetracycline Hcl     REACTION: unspecified  . Tree Extract      Outpatient Prescriptions Prior to Visit  Medication Sig Dispense Refill  . Aloe-Sodium Chloride (AYR SALINE NASAL GEL NA) by Nasal route. Per box        .  Alpha-D-Galactosidase (BEANO) TABS Take by mouth. As needed      . Alpha-Lipoic Acid 600 MG CAPS Take by mouth. Once a day       . Alum & Mag Hydroxide-Simeth (MAGIC MOUTHWASH) SOLN Take by mouth. Gargle and swallow 1 tsp 4 times a day as needed       . Artificial Tear Ointment (REFRESH P.M. OP) Apply to eye. Apply small amount under eye lids at bedtime       . Calcium Citrate-Vitamin D (CITRACAL MAXIMUM) 315-250 MG-UNIT TABS Take by mouth. 1 capsule twice a day       . Cholecalciferol (VITAMIN D) 2000 UNITS CAPS Take by mouth. 1/2 at bedtime       . EPINEPHrine (EPIPEN 2-PAK) 0.3 mg/0.3 mL DEVI As directed       . fexofenadine (ALLEGRA) 180 MG tablet Take 180 mg by mouth daily.        Marland Kitchen gabapentin (NEURONTIN) 600 MG tablet Once a day       . Humidifiers (COOL MIST HUMIDIFIER) MISC by Does not apply route. At bedtime       . hydrocortisone (PROCTOZONE-HC) 2.5 % rectal cream As needed       . lactase (LACTAID) 3000 UNITS tablet 3-4 tabs with dairy liquids and solids       . lidocaine (LIDODERM) 5 % Place 1 patch onto the skin daily as needed. Remove & Discard patch within 12 hours or as directed by MD      . Melatonin 3 MG CAPS Take by mouth. 3 tabs at bedtime      . mometasone (NASONEX) 50 MCG/ACT nasal spray 2 sprays by Nasal route daily.  17 g  6  . MUCINEX D 60-600 MG per tablet TAKE 1 TABLET BY MOUTH TWICE DAILY AS   NEEDED  60 each  1  . neomycin-bacitracin-polymyxin (TRIPLE ANTIBIOTIC) ophthalmic ointment Apply as needed       . nystatin-triamcinolone (MYCOLOG II) cream Apply as needed       . omalizumab (XOLAIR) 150 MG injection 150 SQ every 4 weeks      . Pancrelipase, Lip-Prot-Amyl, (CREON) 24000 UNITS CPEP Take by mouth.  1 with each meal       . Polyethyl Glycol-Propyl Glycol (SYSTANE ULTRA) 0.4-0.3 % SOLN Apply to eye. 1-2 drops each eye every morning and at bedtime       . Prenatal Vit-DSS-Fe Cbn-FA (VINATE ULTRA) TABS Take by mouth. Once a day       . Probiotic Product (ALIGN) 4 MG  CAPS Take by mouth. Once a day       . propylthiouracil (PTU) 50 MG tablet 1 tablet twice a day       . rosuvastatin (CRESTOR) 20 MG tablet Take 10 mg by mouth daily.        . Simethicone (GAS-X EXTRA STRENGTH) 125 MG CAPS Take by mouth. Per box       . zoledronic acid (RECLAST) 5 MG/100ML SOLN Once a year       . Azelastine HCl (ASTEPRO) 0.15 % SOLN 2 sprays by Nasal route daily.  30 mL  6  . Mometasone Furo-Formoterol Fum (DULERA) 100-5 MCG/ACT AERO Inhale into the lungs. 1 puff daily       . acetaminophen (TYLENOL) 500 MG tablet 2 tablet 2 times a day      . metaxalone (SKELAXIN) 800 MG tablet 2-4 times daily as needed          Review of Systems  Constitutional:   No  weight loss, night sweats,  Fevers, chills, fatigue, lassitude. HEENT:   No headaches,  Difficulty swallowing,  Tooth/dental problems,  Sore throat,                No sneezing, itching, ear ache, nasal congestion, post nasal drip,   CV:  No chest pain,  Orthopnea, PND, swelling in lower extremities, anasarca, dizziness, palpitations  GI  No heartburn, indigestion, abdominal pain, nausea, vomiting, diarrhea, change in bowel habits, loss of appetite  Resp: Notes  shortness of breath with exertion or at rest.  No excess mucus, no productive cough,  No non-productive cough,  No coughing up of blood.  No change in color of mucus.  No wheezing.  No chest wall deformity  Skin: no rash or lesions.  GU: no dysuria, change in color of urine, no urgency or frequency.  No flank pain.  MS:  No joint pain or swelling.  No decreased range of motion.  No back pain.  Psych:  No change in mood or affect. No depression or anxiety.  No memory loss.     Objective:   Physical Exam  Filed Vitals:   09/20/10 1142  BP: 120/68  Pulse: 78  Temp: 98.5 F (36.9 C)  TempSrc: Oral  Height: 5' (1.524 m)  Weight: 113 lb 12.8 oz (51.619 kg)  SpO2: 96%    Gen: Pleasant, well-nourished, in no distress,  normal affect  ENT: No lesions,   mouth clear,  oropharynx clear, mod  postnasal drip, nares with nasal inflammation  Neck: No JVD, no TMG, no carotid bruits  Lungs: No use of accessory muscles, no dullness to percussion, exp wheezes, poor airflow  Cardiovascular: RRR, heart sounds normal, no murmur or gallops, no peripheral edema  Abdomen: soft and NT, no HSM,  BS normal  Musculoskeletal: No deformities, no cyanosis or clubbing  Neuro: alert, non focal  Skin: Warm, no lesions or rashes      PFT Conversion 01/23/2010  FVC   FVC PREDICT 2.920  FVC  % Predicted 69.78  FEV1   FEV1 PREDICT 2.251  FEV % Predicted 72.77  FEV1/FVC  FEV1/FVC PRE 77.846  FEV1/FVC%EXP 103.280  FeF 25-75   FEF % EXPEC 79.429  PFT RSLT     Assessment & Plan:   Asthma with allergic rhinitis Improved with Nathanial Rancher and environmental controls Plan Cont xolair x 4months Discussed environmental issues Change dulera to one puff bid      Updated Medication List Outpatient Encounter Prescriptions as of 09/20/2010  Medication Sig Dispense Refill  . acetaminophen (TYLENOL ARTHRITIS PAIN) 650 MG CR tablet Take 1,300 mg by mouth 2 (two) times daily.        . Aloe-Sodium Chloride (AYR SALINE NASAL GEL NA) by Nasal route. Per box        . Alpha-D-Galactosidase (BEANO) TABS Take by mouth. As needed      . Alpha-Lipoic Acid 600 MG CAPS Take by mouth. Once a day       . Alum & Mag Hydroxide-Simeth (MAGIC MOUTHWASH) SOLN Take by mouth. Gargle and swallow 1 tsp 4 times a day as needed       . Artificial Tear Ointment (REFRESH P.M. OP) Apply to eye. Apply small amount under eye lids at bedtime       . Azelastine HCl 0.15 % SOLN Place 1 spray into the nose daily.        . Calcium Citrate-Vitamin D (CITRACAL MAXIMUM) 315-250 MG-UNIT TABS Take by mouth. 1 capsule twice a day       . Cholecalciferol (VITAMIN D) 2000 UNITS CAPS Take by mouth. 1/2 at bedtime       . EPINEPHrine (EPIPEN 2-PAK) 0.3 mg/0.3 mL DEVI As directed       .  fexofenadine (ALLEGRA) 180 MG tablet Take 180 mg by mouth daily.        Marland Kitchen gabapentin (NEURONTIN) 600 MG tablet Once a day       . Humidifiers (COOL MIST HUMIDIFIER) MISC by Does not apply route. At bedtime       . hydrocortisone (PROCTOZONE-HC) 2.5 % rectal cream As needed       . lactase (LACTAID) 3000 UNITS tablet 3-4 tabs with dairy liquids and solids       . lidocaine (LIDODERM) 5 % Place 1 patch onto the skin daily as needed. Remove & Discard patch within 12 hours or as directed by MD      . Melatonin 3 MG CAPS Take by mouth. 3 tabs at bedtime      . mometasone (NASONEX) 50 MCG/ACT nasal spray 2 sprays by Nasal route daily.  17 g  6  . mometasone-formoterol (DULERA) 100-5 MCG/ACT AERO Inhale 1 puff into the lungs 2 (two) times daily. 1 puff daily      . MUCINEX D 60-600 MG per tablet TAKE 1 TABLET BY MOUTH TWICE DAILY AS   NEEDED  60 each  1  . neomycin-bacitracin-polymyxin (TRIPLE ANTIBIOTIC) ophthalmic ointment Apply as needed       . nystatin-triamcinolone (MYCOLOG II) cream Apply as needed       . omalizumab (XOLAIR) 150 MG injection 150 SQ every 4 weeks      . Pancrelipase, Lip-Prot-Amyl, (CREON) 24000 UNITS CPEP Take by mouth. 1 with each meal       . Polyethyl Glycol-Propyl Glycol (SYSTANE ULTRA) 0.4-0.3 % SOLN Apply to eye. 1-2 drops each eye every morning and at bedtime       . Prenatal Vit-DSS-Fe Cbn-FA (VINATE ULTRA) TABS Take by mouth. Once a day       . Probiotic Product (ALIGN) 4 MG CAPS  Take by mouth. Once a day       . propylthiouracil (PTU) 50 MG tablet 1 tablet twice a day       . rosuvastatin (CRESTOR) 20 MG tablet Take 10 mg by mouth daily.        . Simethicone (GAS-X EXTRA STRENGTH) 125 MG CAPS Take by mouth. Per box       . zoledronic acid (RECLAST) 5 MG/100ML SOLN Once a year       . DISCONTD: Azelastine HCl (ASTEPRO) 0.15 % SOLN 2 sprays by Nasal route daily.  30 mL  6  . DISCONTD: Mometasone Furo-Formoterol Fum (DULERA) 100-5 MCG/ACT AERO Inhale into the lungs. 1  puff daily       . DISCONTD: acetaminophen (TYLENOL) 500 MG tablet 2 tablet 2 times a day      . DISCONTD: metaxalone (SKELAXIN) 800 MG tablet 2-4 times daily as needed

## 2010-09-20 NOTE — Assessment & Plan Note (Signed)
Improved with Nathanial Rancher and environmental controls Plan Cont xolair x 4months Discussed environmental issues Change dulera to one puff bid

## 2010-09-20 NOTE — Patient Instructions (Signed)
Change dulera to one puff twice daily Stay on xolair for now Return 4 months

## 2010-09-27 ENCOUNTER — Ambulatory Visit: Payer: Medicare Other

## 2010-10-04 ENCOUNTER — Ambulatory Visit (INDEPENDENT_AMBULATORY_CARE_PROVIDER_SITE_OTHER): Payer: Medicare Other

## 2010-10-04 DIAGNOSIS — J45909 Unspecified asthma, uncomplicated: Secondary | ICD-10-CM

## 2010-10-05 MED ORDER — OMALIZUMAB 150 MG ~~LOC~~ SOLR
150.0000 mg | Freq: Once | SUBCUTANEOUS | Status: AC
  Administered 2010-10-05: 150 mg via SUBCUTANEOUS

## 2010-10-06 ENCOUNTER — Telehealth: Payer: Self-pay | Admitting: Critical Care Medicine

## 2010-10-06 ENCOUNTER — Encounter: Payer: Self-pay | Admitting: Pulmonary Disease

## 2010-10-06 ENCOUNTER — Ambulatory Visit (INDEPENDENT_AMBULATORY_CARE_PROVIDER_SITE_OTHER): Payer: Medicare Other | Admitting: Pulmonary Disease

## 2010-10-06 VITALS — BP 96/62 | HR 70 | Temp 98.1°F | Ht 61.0 in | Wt 110.8 lb

## 2010-10-06 DIAGNOSIS — J45909 Unspecified asthma, uncomplicated: Secondary | ICD-10-CM

## 2010-10-06 NOTE — Telephone Encounter (Signed)
Called, spoke with pt.  She c/o prod cough with thick, dark green mucus.  Started yesterday evening.  Has noticed chills as well.  OV scheduled with VS for today at 1:30 -- pt aware.

## 2010-10-06 NOTE — Progress Notes (Signed)
Subjective:    Patient ID: Ebony Scott, female    DOB: [birth year], 60 y.o.   MRN: [id number]  HPI 60 y.o. WF atopic moderate persistent asthma, elevated IgE. Started Xolair 05/03/10  Initial Pulmonary Consultation 12/23/09  She was mowing the lawn yesterday.  She wore a mask.  She developed a cough with yellow to green sputum today.  She also had sinus fullness over her left eye.  She denied fever, wheeze, hemoptysis, chest pain, abdominal pain, rash, or gland swelling.  She has been using dulera one puff bid but not nasal irrigation.  Past Medical History  Diagnosis Date  . Chronic neck pain   . Allergic rhinitis   . Moderate persistent asthma     -FeV1 72% 2011, -IgE 102 2011, CT sinus Neg 2011  . Breast cancer     in remission  . GERD (gastroesophageal reflux disease)   . Hypothyroidism   . Osteoporosis     on reclast yearly  . Hiatal hernia   . Diverticulosis   . Hemorrhoids   . Asthma   . IBS (irritable bowel syndrome)      Family History  Problem Relation Age of Onset  . Allergies Mother   . Allergies Father   . Heart disease Mother   . Heart disease Father   . Arthritis Mother   . Arthritis Father   . Lung cancer Mother   . Colon cancer      Maternal half uncle  . Colon cancer      Maternal half aunt  . Colitis Daughter     Review of Systems     Objective:   Physical Exam  BP 96/62  Pulse 70  Temp(Src) 98.1 F (36.7 C) (Oral)  Ht 5\' 1"  (1.549 m)  Wt 110 lb 12.8 oz (50.259 kg)  BMI 20.94 kg/m2  SpO2 99%  HEENT - pale nasal mucosa, no sinus tenderness, TM clear, no oral exudate, no LAN Chest - no wheeze/rales Cardiac - s1 s2 regular Abd - soft, nontender Ext - no edema Neuro - normal strength, CN intact Skin - no rash Psych - normal mood/behavior     Assessment & Plan:   Asthma with allergic rhinitis She has mild flare of her asthma and rhinits after mowing lawn.  I don't think she has a bacterial infection.  She does not need antibiotics,  prednisone, or chest xray at this time.  Advised her to use dulera two puffs bid and nasal irrigation for next two to three days.  She is to continue her other allergy and asthma regimen.  She is to call if her symptoms progress.    Updated Medication List Outpatient Encounter Prescriptions as of 10/06/2010  Medication Sig Dispense Refill  . Aloe-Sodium Chloride (AYR SALINE NASAL GEL NA) by Nasal route. Per box        . Alpha-D-Galactosidase (BEANO) TABS Take by mouth. As needed      . Alpha-Lipoic Acid 600 MG CAPS Take by mouth. Once a day       . Alum & Mag Hydroxide-Simeth (MAGIC MOUTHWASH) SOLN Take by mouth. Gargle and swallow 1 tsp 4 times a day as needed       . Artificial Tear Ointment (REFRESH P.M. OP) Apply to eye. Apply small amount under eye lids at bedtime       . Azelastine HCl 0.15 % SOLN Place 1 spray into the nose daily.        . Calcium Citrate-Vitamin D (CITRACAL  MAXIMUM) 315-250 MG-UNIT TABS Take by mouth. 1 capsule twice a day       . Cholecalciferol (VITAMIN D) 2000 UNITS CAPS Take by mouth. 1/2 at bedtime       . conjugated estrogens (PREMARIN) vaginal cream Once a  week       . EPINEPHrine (EPIPEN 2-PAK) 0.3 mg/0.3 mL DEVI As directed       . fexofenadine (ALLEGRA) 180 MG tablet Take 180 mg by mouth daily.        Marland Kitchen gabapentin (NEURONTIN) 600 MG tablet Once a day       . Humidifiers (COOL MIST HUMIDIFIER) MISC by Does not apply route. At bedtime       . hydrocortisone (PROCTOZONE-HC) 2.5 % rectal cream As needed       . lactase (LACTAID) 3000 UNITS tablet 3-4 tabs with dairy liquids and solids       . Melatonin 3 MG CAPS Take by mouth. 3 tabs at bedtime      . mometasone (NASONEX) 50 MCG/ACT nasal spray 2 sprays by Nasal route daily.  17 g  6  . mometasone-formoterol (DULERA) 100-5 MCG/ACT AERO Inhale 1 puff into the lungs 2 (two) times daily. 1 puff daily      . MUCINEX D 60-600 MG per tablet TAKE 1 TABLET BY MOUTH TWICE DAILY AS   NEEDED  60 each  1  .  neomycin-bacitracin-polymyxin (TRIPLE ANTIBIOTIC) ophthalmic ointment Apply as needed       . nystatin-triamcinolone (MYCOLOG II) cream Apply as needed       . omalizumab (XOLAIR) 150 MG injection 150 SQ every 4 weeks      . Pancrelipase, Lip-Prot-Amyl, (CREON) 24000 UNITS CPEP Take by mouth. 1 with each meal       . Polyethyl Glycol-Propyl Glycol (SYSTANE ULTRA) 0.4-0.3 % SOLN Apply to eye. 1-2 drops each eye every morning and at bedtime       . Prenatal Vit-DSS-Fe Cbn-FA (VINATE ULTRA) TABS Take by mouth. Once a day       . Probiotic Product (ALIGN) 4 MG CAPS Take by mouth. Once a day       . propylthiouracil (PTU) 50 MG tablet 1 tablet twice a day       . rosuvastatin (CRESTOR) 20 MG tablet Take 10 mg by mouth daily.        . Simethicone (GAS-X EXTRA STRENGTH) 125 MG CAPS Take by mouth. Per box       . zoledronic acid (RECLAST) 5 MG/100ML SOLN Once a year       . acetaminophen (TYLENOL ARTHRITIS PAIN) 650 MG CR tablet Take 1,300 mg by mouth 2 (two) times daily.        Marland Kitchen lidocaine (LIDODERM) 5 % Place 1 patch onto the skin daily as needed. Remove & Discard patch within 12 hours or as directed by MD       Facility-Administered Encounter Medications as of 10/06/2010  Medication Dose Route Frequency Provider Last Rate Last Dose  . omalizumab Geoffry Paradise) injection 150 mg  150 mg Subcutaneous Once Shan Levans, MD   150 mg at 10/05/10 1629

## 2010-10-06 NOTE — Assessment & Plan Note (Signed)
She has mild flare of her asthma and rhinits after mowing lawn.  I don't think she has a bacterial infection.  She does not need antibiotics, prednisone, or chest xray at this time.  Advised her to use dulera two puffs bid and nasal irrigation for next two to three days.  She is to continue her other allergy and asthma regimen.  She is to call if her symptoms progress.

## 2010-10-06 NOTE — Patient Instructions (Signed)
Dulera two puffs twice per day for next two days Nasal irrigation once or twice per day for next two days Call if symptoms get worse Follow up with Dr. Delford Field in December

## 2010-11-08 ENCOUNTER — Ambulatory Visit (INDEPENDENT_AMBULATORY_CARE_PROVIDER_SITE_OTHER): Payer: Medicare Other

## 2010-11-08 DIAGNOSIS — J45909 Unspecified asthma, uncomplicated: Secondary | ICD-10-CM

## 2010-11-08 MED ORDER — OMALIZUMAB 150 MG ~~LOC~~ SOLR
150.0000 mg | Freq: Once | SUBCUTANEOUS | Status: AC
  Administered 2010-11-08: 150 mg via SUBCUTANEOUS

## 2010-11-14 LAB — URINALYSIS, ROUTINE W REFLEX MICROSCOPIC
Bilirubin Urine: NEGATIVE
Glucose, UA: NEGATIVE
Hgb urine dipstick: NEGATIVE
Ketones, ur: 15 — AB
Nitrite: NEGATIVE
Protein, ur: NEGATIVE
Specific Gravity, Urine: 1.041 — ABNORMAL HIGH
Urobilinogen, UA: 0.2
pH: 5.5

## 2010-11-14 LAB — COMPREHENSIVE METABOLIC PANEL
ALT: 26
AST: 28
Albumin: 3.8
Alkaline Phosphatase: 39
BUN: 23
CO2: 28
Calcium: 9.5
Chloride: 105
Creatinine, Ser: 0.89
GFR calc Af Amer: >60
GFR calc non Af Amer: >60
Glucose, Bld: 64 — ABNORMAL LOW
Potassium: 4
Sodium: 141
Total Bilirubin: 1.2
Total Protein: 6.6

## 2010-11-14 LAB — CBC
HCT: 36.3
Hemoglobin: 12.3
MCHC: 33.8
MCV: 97.7
Platelets: 202
RBC: 3.72 — ABNORMAL LOW
RDW: 12.3
WBC: 4.6

## 2010-11-14 LAB — DIFFERENTIAL
Basophils Absolute: 0
Basophils Relative: 1
Eosinophils Absolute: 0.4
Eosinophils Relative: 8 — ABNORMAL HIGH
Lymphocytes Relative: 34
Lymphs Abs: 1.6
Monocytes Absolute: 0.5
Monocytes Relative: 12
Neutro Abs: 2.1
Neutrophils Relative %: 46

## 2010-11-14 LAB — PREGNANCY, URINE: Preg Test, Ur: NEGATIVE

## 2010-11-14 LAB — PROTIME-INR
INR: 0.9
Prothrombin Time: 12.4

## 2010-11-22 ENCOUNTER — Other Ambulatory Visit: Payer: Self-pay | Admitting: Critical Care Medicine

## 2010-12-13 ENCOUNTER — Telehealth: Payer: Self-pay | Admitting: Critical Care Medicine

## 2010-12-13 ENCOUNTER — Ambulatory Visit (INDEPENDENT_AMBULATORY_CARE_PROVIDER_SITE_OTHER): Payer: Medicare Other

## 2010-12-13 DIAGNOSIS — J45909 Unspecified asthma, uncomplicated: Secondary | ICD-10-CM

## 2010-12-13 MED ORDER — OMALIZUMAB 150 MG ~~LOC~~ SOLR
150.0000 mg | Freq: Once | SUBCUTANEOUS | Status: AC
  Administered 2010-12-13: 150 mg via SUBCUTANEOUS

## 2010-12-13 NOTE — Telephone Encounter (Signed)
Called, spoke with pt.  States next month she may be out of town and would like to know if it would mess her up getting the xolair injection either 1 week early or 1 week late.  States when we call back with this answer - ok to leave a detailed message on cell VM in she does not answer.  Dr. Delford Field, pls advise if this would be ok.  Thanks!

## 2010-12-13 NOTE — Telephone Encounter (Signed)
Not a problem either way

## 2010-12-14 NOTE — Telephone Encounter (Signed)
Called and spoke with pt.  Pt aware of PW's response.

## 2010-12-20 ENCOUNTER — Telehealth: Payer: Self-pay | Admitting: Critical Care Medicine

## 2010-12-20 MED ORDER — MAGIC MOUTHWASH: 5.0000 mL | Freq: Three times a day (TID) | ORAL | Status: DC

## 2010-12-20 NOTE — Telephone Encounter (Signed)
Called in medication to [facility] and pt is aware.

## 2010-12-20 NOTE — Telephone Encounter (Signed)
I am ok with this call in #[id number]  5-10ML swish gargle expectorate tid

## 2010-12-20 NOTE — Telephone Encounter (Signed)
Pt states she is rinsing after use, using her aero chamber,brushes teeth and still having thrush and sores in mouth. Would like to have MMW called to pharmacy. Pt aware PW is still seeing patients. Please advise.

## 2010-12-25 ENCOUNTER — Telehealth: Payer: Self-pay | Admitting: Critical Care Medicine

## 2010-12-25 NOTE — Telephone Encounter (Signed)
Spoke with Rhonda-she and I will work on this matter together in the morning as nothing can be done at this time.

## 2010-12-27 ENCOUNTER — Telehealth: Payer: Self-pay | Admitting: Critical Care Medicine

## 2010-12-27 NOTE — Telephone Encounter (Signed)
Pt aware Florentina Addison and Bjorn Loser are working on this. Pls advise.

## 2010-12-27 NOTE — Telephone Encounter (Signed)
Pt calling again in reference to previous message can be reached at [phone].Ebony Scott

## 2010-12-27 NOTE — Telephone Encounter (Signed)
Error.Ebony Scott ° °

## 2011-01-09 ENCOUNTER — Ambulatory Visit (INDEPENDENT_AMBULATORY_CARE_PROVIDER_SITE_OTHER): Payer: Medicare Other

## 2011-01-09 DIAGNOSIS — J45909 Unspecified asthma, uncomplicated: Secondary | ICD-10-CM

## 2011-01-09 MED ORDER — OMALIZUMAB 150 MG ~~LOC~~ SOLR
150.0000 mg | Freq: Once | SUBCUTANEOUS | Status: AC
  Administered 2011-01-09: 150 mg via SUBCUTANEOUS

## 2011-01-09 NOTE — Telephone Encounter (Signed)
Katie, do you know if anything has been done regarding PA for Xolair?

## 2011-01-09 NOTE — Telephone Encounter (Signed)
I spoke with patient today in our office; she is aware that I am working on this for her as Ebony Scott is out of the office at this time. Pt is understanding of this.

## 2011-01-17 NOTE — Telephone Encounter (Signed)
Called and spoke with [facility], who stated that they did not have a prior authorization on file or even started for this patient's xolair. Information given and clinical information was faxed in to [facility]. Waiting on authorization. Rhonda J Cobb

## 2011-01-17 NOTE — Telephone Encounter (Signed)
I spoke with the patient and she wants to know about  The bills she has received for 7/12, 8/12 and 9/12. She said that we were working to see if this would be covered since her authorization ran out at that time. Rhonda pls advise.

## 2011-01-17 NOTE — Telephone Encounter (Signed)
Authorization obtained for Xolair from 01/16/11-01/06/12 for 13 visits. Copy of authorization notification sent down to be scanned. Tammy S given copy as well and instructed that she may go ahead and order xolair. Pt will need to be scheduled an appointment for xolair injection once medication has been ordered.

## 2011-01-19 NOTE — Telephone Encounter (Signed)
Called and spoke with patient and advised her of the above. Advised patient that if she was sent a statement from [place] in regards to her xolair injection to bring it in to [place] and we will file any balance with [employer]. Pt voiced understanding.

## 2011-01-19 NOTE — Telephone Encounter (Signed)
Called and spoke with Ebony Scott at [facility]. Per [employer] will not allow them to back date authorization. New authorization has been obtained for DOS: 01/16/11-01/16/12. Prior authorization was from 03/24/10 - 09/20/10. Patient has [employer] assistance which may cover any amount that is not covered under Medicare.

## 2011-01-29 ENCOUNTER — Ambulatory Visit: Payer: Medicare Other | Admitting: Critical Care Medicine

## 2011-01-30 ENCOUNTER — Other Ambulatory Visit (HOSPITAL_COMMUNITY): Payer: Self-pay | Admitting: *Deleted

## 2011-01-31 ENCOUNTER — Encounter (HOSPITAL_COMMUNITY)
Admission: RE | Admit: 2011-01-31 | Discharge: 2011-01-31 | Disposition: A | Discharge status: Home / Self Care | Payer: Medicare Other | Source: Ambulatory Visit | Attending: Critical Care Medicine | Admitting: Critical Care Medicine

## 2011-01-31 ENCOUNTER — Encounter: Payer: Self-pay | Admitting: Critical Care Medicine

## 2011-01-31 ENCOUNTER — Ambulatory Visit (INDEPENDENT_AMBULATORY_CARE_PROVIDER_SITE_OTHER): Payer: Medicare Other | Admitting: Critical Care Medicine

## 2011-01-31 ENCOUNTER — Ambulatory Visit: Payer: Medicare Other | Admitting: Critical Care Medicine

## 2011-01-31 VITALS — BP 130/70 | HR 80 | Temp 98.0°F | Ht 61.0 in | Wt 104.2 lb

## 2011-01-31 DIAGNOSIS — M810 Age-related osteoporosis without current pathological fracture: Secondary | ICD-10-CM | POA: Insufficient documentation

## 2011-01-31 DIAGNOSIS — J45909 Unspecified asthma, uncomplicated: Secondary | ICD-10-CM

## 2011-01-31 MED ORDER — ZOLEDRONIC ACID 5 MG/100ML IV SOLN
5.0000 mg | Freq: Once | INTRAVENOUS | Status: AC
Start: 2011-01-31 — End: 2011-01-31
  Administered 2011-01-31: 5 mg via INTRAVENOUS
  Filled 2011-01-31: qty 100

## 2011-01-31 MED ORDER — AZITHROMYCIN 250 MG PO TABS: 250.0000 mg | ORAL_TABLET | Freq: Every day | ORAL | Status: AC

## 2011-01-31 MED ORDER — MOMETASONE FURO-FORMOTEROL FUM 100-5 MCG/ACT IN AERO: 2.0000 | INHALATION_SPRAY | Freq: Two times a day (BID) | RESPIRATORY_TRACT | Status: DC

## 2011-01-31 MED ORDER — MAGIC MOUTHWASH: 5.0000 mL | Freq: Three times a day (TID) | ORAL | Status: DC

## 2011-01-31 NOTE — Patient Instructions (Signed)
Azithromycin 250mg  Take two once then one daily until gone Refills on dulera and magic mouthwash will be obtained Return 3 months

## 2011-01-31 NOTE — Progress Notes (Signed)
Subjective:    Patient ID: Ebony Scott, female    DOB: 04-04-1950, 60 y.o.   MRN: [id number]  Cough Associated symptoms include shortness of breath.  Shortness of Breath   60 y.o. WF atopic moderate persistent asthma, elevated IgE. Started Xolair 05/03/10  Initial Pulmonary Consultation 12/23/09  She was mowing the lawn yesterday.  She wore a mask.  She developed a cough with yellow to green sputum today.  She also had sinus fullness over her left eye.  She denied fever, wheeze, hemoptysis, chest pain, abdominal pain, rash, or gland swelling.  She has been using dulera one puff bid but not nasal irrigation.  01/31/2011 Pt last seen 8/12 by VS for flare . Now is coughing more, notes more sinus drainage.  Has drops on L eye and coughs every evening. Mucus now is gray to yellow.  Changed color two weeks ago. Notes pndrip and ear pain with sinus pressure.  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   PFR are better   Past Medical History  Diagnosis Date  . Chronic neck pain   . Allergic rhinitis   . Moderate persistent asthma     -FeV1 72% 2011, -IgE 102 2011, CT sinus Neg 2011  . Breast cancer     in remission  . GERD (gastroesophageal reflux disease)   . Hypothyroidism   . Osteoporosis     on reclast yearly  . Hiatal hernia   . Diverticulosis   . Hemorrhoids   . Asthma   . IBS (irritable bowel syndrome)      Family History  Problem Relation Age of Onset  . Allergies Mother   . Allergies Father   . Heart disease Mother   . Heart disease Father   . Arthritis Mother   . Arthritis Father   . Lung cancer Mother   . Colon cancer      Maternal half uncle  . Colon cancer      Maternal half aunt  . Colitis Daughter     Review of Systems  Respiratory: Positive for cough and shortness of breath.        Objective:   Physical Exam   BP 130/70  Pulse 80  Temp(Src) 98 F (36.7 C) (Oral)  Ht 5\' 1"  (1.549 m)  Wt 104 lb 3.2 oz (47.265 kg)  BMI 19.69 kg/m2   SpO2 96%  HEENT - pale nasal mucosa, no sinus tenderness, TM clear, no oral exudate, no LAN Chest - no wheeze/rales Cardiac - s1 s2 regular Abd - soft, nontender Ext - no edema Neuro - normal strength, CN intact Skin - no rash Psych - normal mood/behavior     Assessment & Plan:   Asthma with allergic rhinitis Mild asthma flare d/t sinusitis R>L Plan Azithromycin x 5days Nasal rinse  Cont xolair, this has helped      Updated Medication List Outpatient Encounter Prescriptions as of 01/31/2011  Medication Sig Dispense Refill  . acetaminophen (TYLENOL ARTHRITIS PAIN) 650 MG CR tablet Take 1,300 mg by mouth 2 (two) times daily.        . Aloe-Sodium Chloride (AYR SALINE NASAL GEL NA) by Nasal route. Per box        . Alpha-D-Galactosidase (BEANO) TABS Take by mouth. As needed      . Alpha-Lipoic Acid 600 MG CAPS Take by mouth. Once a day       . Alum & Mag Hydroxide-Simeth (MAGIC MOUTHWASH) SOLN Take by mouth. Gargle  and swallow 1 tsp 4 times a day as needed       . Alum & Mag Hydroxide-Simeth (MAGIC MOUTHWASH) SOLN Take 5-10 mLs by mouth 3 (three) times daily.  180 mL  2  . Artificial Tear Ointment (REFRESH P.M. OP) Apply to eye. Apply small amount under eye lids at bedtime       . Azelastine HCl 0.15 % SOLN Place 1 spray into the nose daily.        . brimonidine (ALPHAGAN) 0.2 % ophthalmic solution Place 1 drop into the left eye Twice daily.      Marland Kitchen BROMDAY 0.09 % SOLN Place 1 drop into the left eye Once daily.      . Calcium Citrate-Vitamin D (CITRACAL MAXIMUM) 315-250 MG-UNIT TABS Take by mouth. 1 capsule twice a day       . Cholecalciferol (VITAMIN D) 2000 UNITS CAPS Take by mouth. 1/2 at bedtime       . EPINEPHrine (EPIPEN 2-PAK) 0.3 mg/0.3 mL DEVI As directed       . ESTRACE VAGINAL 0.1 MG/GM vaginal cream Twice a week      . fexofenadine (ALLEGRA) 180 MG tablet Take 180 mg by mouth daily.        Marland Kitchen gabapentin (NEURONTIN) 600 MG tablet Once a day       . hydrocortisone  (PROCTOZONE-HC) 2.5 % rectal cream As needed       . lactase (LACTAID) 3000 UNITS tablet 3-4 tabs with dairy liquids and solids       . lidocaine (LIDODERM) 5 % Place 1 patch onto the skin daily as needed. Remove & Discard patch within 12 hours or as directed by MD      . Melatonin 3 MG CAPS Take by mouth. 3 tabs at bedtime      . methimazole (TAPAZOLE) 5 MG tablet Take 5 mg by mouth 3 (three) times daily.        . mometasone (NASONEX) 50 MCG/ACT nasal spray 2 sprays by Nasal route daily.  17 g  6  . mometasone-formoterol (DULERA) 100-5 MCG/ACT AERO Inhale 2 puffs into the lungs 2 (two) times daily. 1 puff daily  1 Inhaler  6  . neomycin-bacitracin-polymyxin (TRIPLE ANTIBIOTIC) ophthalmic ointment Apply as needed       . nystatin-triamcinolone (MYCOLOG II) cream Apply as needed       . omalizumab (XOLAIR) 150 MG injection 150 SQ every 4 weeks      . Pancrelipase, Lip-Prot-Amyl, (CREON) 24000 UNITS CPEP Take by mouth. 1 with each meal       . Polyethyl Glycol-Propyl Glycol (SYSTANE ULTRA) 0.4-0.3 % SOLN Apply to eye. 1-2 drops each eye every morning and at bedtime       . Prenatal Vit-DSS-Fe Cbn-FA (VINATE ULTRA) TABS Take by mouth. Once a day       . Probiotic Product (ALIGN) 4 MG CAPS Take by mouth. Once a day       . pseudoephedrine-guaifenesin (MUCINEX D) 60-600 MG per tablet        . rosuvastatin (CRESTOR) 20 MG tablet Take 10 mg by mouth daily.        . Simethicone (GAS-X EXTRA STRENGTH) 125 MG CAPS Take by mouth. Per box       . zoledronic acid (RECLAST) 5 MG/100ML SOLN Once a year       . DISCONTD: Alum & Mag Hydroxide-Simeth (MAGIC MOUTHWASH) SOLN Take 5-10 mLs by mouth 3 (three) times daily.  180 mL  0  . DISCONTD: mometasone-formoterol (DULERA) 100-5 MCG/ACT AERO Inhale 1 puff into the lungs 2 (two) times daily. 1 puff daily      . DISCONTD: MUCINEX D 60-600 MG per tablet TAKE 1 TABLET BY MOUTH TWICE DAILY AS   NEEDED  60 each  3  . azithromycin (ZITHROMAX) 250 MG tablet Take 1 tablet  (250 mg total) by mouth daily. Take two once then one daily until gone  6 each  0  . propylthiouracil (PTU) 50 MG tablet 1 tablet twice a day       . DISCONTD: conjugated estrogens (PREMARIN) vaginal cream Once a  week       . DISCONTD: Humidifiers (COOL MIST HUMIDIFIER) MISC by Does not apply route. At bedtime        Facility-Administered Encounter Medications as of 01/31/2011  Medication Dose Route Frequency Provider Last Rate Last Dose  . zoledronic acid (RECLAST) injection 5 mg  5 mg Intravenous Once Leo Grosser, MD   5 mg at 01/31/11 1203

## 2011-01-31 NOTE — Assessment & Plan Note (Signed)
Mild asthma flare d/t sinusitis R>L Plan Azithromycin x 5days Nasal rinse  Cont xolair, this has helped

## 2011-02-01 ENCOUNTER — Telehealth: Payer: Self-pay | Admitting: Critical Care Medicine

## 2011-02-01 MED ORDER — MAGIC MOUTHWASH: 5.0000 mL | Freq: Three times a day (TID) | ORAL | Status: DC

## 2011-02-01 NOTE — Telephone Encounter (Signed)
Pt states she saw PW on 12/12 and she received all of her prescriptions but the MMW. The pharmacy did not have this one. I have resent the rx to her pharmacy. Pt aware.

## 2011-02-05 ENCOUNTER — Telehealth: Payer: Self-pay | Admitting: Critical Care Medicine

## 2011-02-05 MED ORDER — DOXYCYCLINE HYCLATE 100 MG PO TABS: 100.0000 mg | ORAL_TABLET | Freq: Two times a day (BID) | ORAL | Status: AC

## 2011-02-05 NOTE — Telephone Encounter (Signed)
I spoke with pt and made her aware of PW recs. She voiced her understanding and was fine with doxycyline being called in for her. Pt aware if not improving then she needs OV. rx has been sent

## 2011-02-05 NOTE — Telephone Encounter (Signed)
I spoke with pt and she c/o sore throat, cough w/ yellow thick phlem, left ear pain, lymph node on left side is swollen/tender, chills and sweats, feels exhausted, also feels like "bricks is sitting her chest". Pt states she finished the zpak yesterday but feels she needs a different abx called in for her. Pt denies any nausea, vomiting, and doesn;t know if she is running a fever. Please advise Dr. Delford Field, thanks  Allergies  Allergen Reactions  . Dust Mite Extract   . Eggs Or Egg-Derived Products   . Hydrocodone-Acetaminophen     REACTION: unspecified  . Levofloxacin     REACTION: GI upset  . Milk-Related Compounds   . Molds & Smuts   . Morphine Sulfate     REACTION: pruritis  . Oxycodone Hcl   . Paroxetine     REACTION: unspecified  . Penicillins     REACTION: rash  . Rofecoxib     REACTION: feet swelling  . Shrimp Flavor   . Strawberry   . Tetracycline Hcl     REACTION: unspecified  . Tree Extract

## 2011-02-05 NOTE — Telephone Encounter (Signed)
Call in doxycycline 100mg  bid x 7days OV with TP if unimproving

## 2011-02-07 ENCOUNTER — Ambulatory Visit (INDEPENDENT_AMBULATORY_CARE_PROVIDER_SITE_OTHER): Payer: Medicare Other

## 2011-02-07 DIAGNOSIS — J45909 Unspecified asthma, uncomplicated: Secondary | ICD-10-CM

## 2011-02-07 MED ORDER — OMALIZUMAB 150 MG ~~LOC~~ SOLR
150.0000 mg | Freq: Once | SUBCUTANEOUS | Status: AC
  Administered 2011-02-07: 150 mg via SUBCUTANEOUS

## 2011-03-05 ENCOUNTER — Telehealth: Payer: Self-pay | Admitting: Critical Care Medicine

## 2011-03-05 NOTE — Telephone Encounter (Signed)
Spoke with patient-aware that our PCC's would have this information;l will forward to their box to help assist [place] in this matter. Pt was very understanding and will await a phone call back.

## 2011-03-07 ENCOUNTER — Ambulatory Visit (INDEPENDENT_AMBULATORY_CARE_PROVIDER_SITE_OTHER): Payer: Medicare Other

## 2011-03-07 DIAGNOSIS — J45909 Unspecified asthma, uncomplicated: Secondary | ICD-10-CM

## 2011-03-07 IMAGING — MR MR HEAD WO/W CM
10 of 13 series · 30 of 48 positions shown · IV contrast (multihance)
Comparison: None available

CLINICAL DATA: Vertigo and dizziness.  Normal hearing.  History of
breast cancer.

MRI HEAD WITHOUT AND WITH CONTRAST
TECHNIQUE: Multiplanar, multiecho pulse sequences of the brain and
surrounding structures were obtained according to standard protocol
without and with intravenous contrast
Contrast: 9 ml Multihance

[Series 2: t1_se_sag · sagittal · 5.0mm · 0.45mm/px · 1 of 19 slices shown]
[im 1/19]
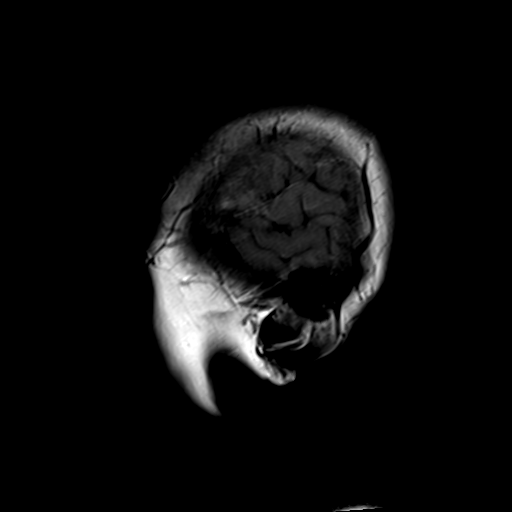

[Series 3: ep2d_diff_(id)_trace · axial · 5.0mm · 1.80mm/px · z∈[-24,+106]mm · 4 of 42 slices shown]
[im 1/42]
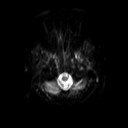
[im 14/42]
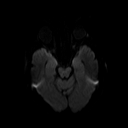
[im 28/42]
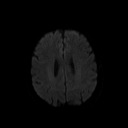
[im 42/42]
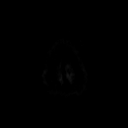

[Series 4: ep2d_diff_(id)_trace_adc · axial · 5.0mm · 1.80mm/px · z∈[-24,+106]mm · 3 of 21 slices shown]
[im 1/21]
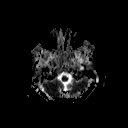
[im 11/21]
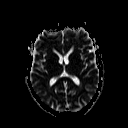
[im 21/21]
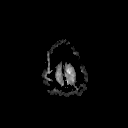

[Series 5: FLAIR · axial · 5.0mm · 0.45mm/px · z∈[-24,+106]mm · 3 of 21 slices shown]
[im 1/21]
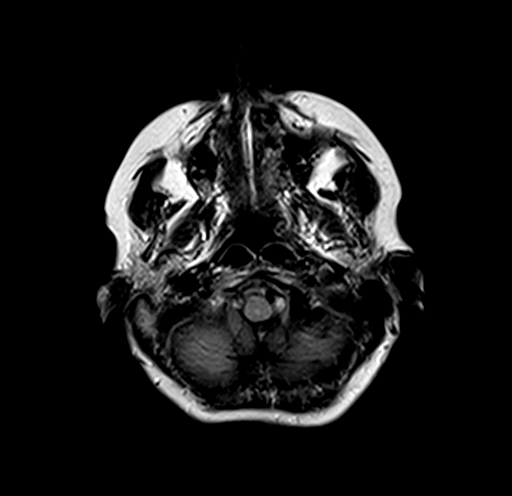
[im 11/21]
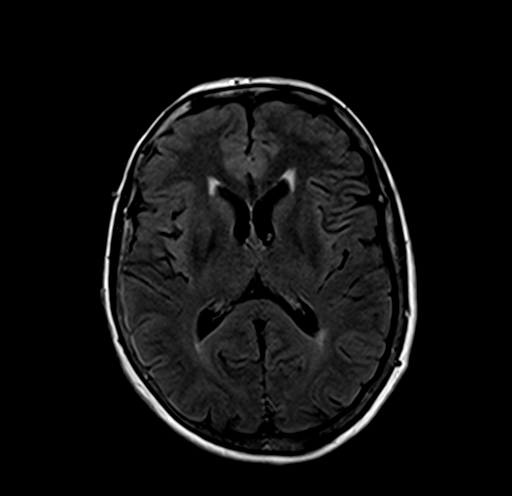
[im 21/21]
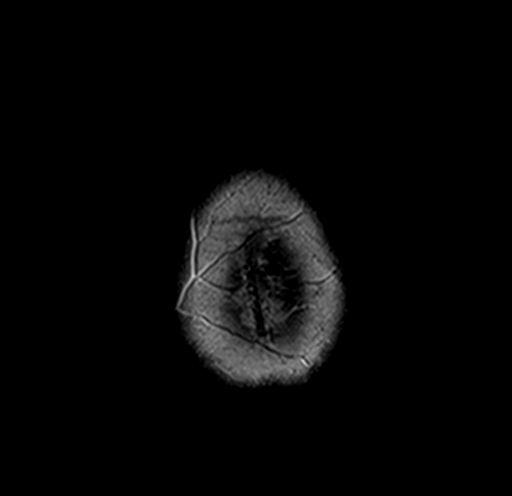

[Series 6: t2_tse_tra_512 · axial · 5.0mm · 0.60mm/px · z∈[-25,+105]mm · 3 of 21 slices shown]
[im 1/21]
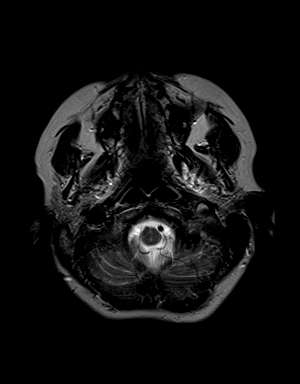
[im 11/21]
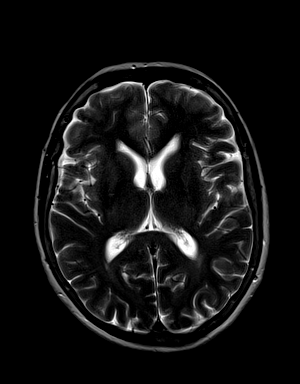
[im 21/21]
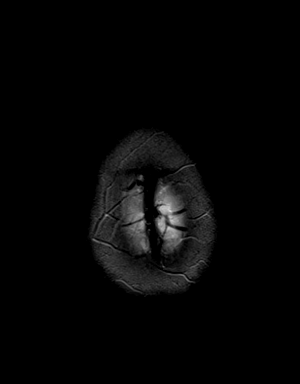

[Series 7: t1_mpr_tra · axial · 2.0mm · 0.45mm/px · z∈[-31,+111]mm · 8 of 72 slices shown]
[im 1/72]
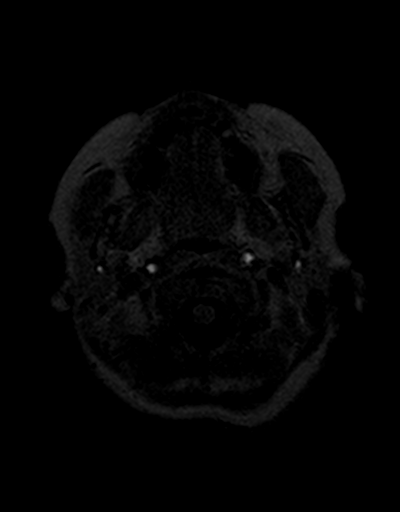
[im 9/72]
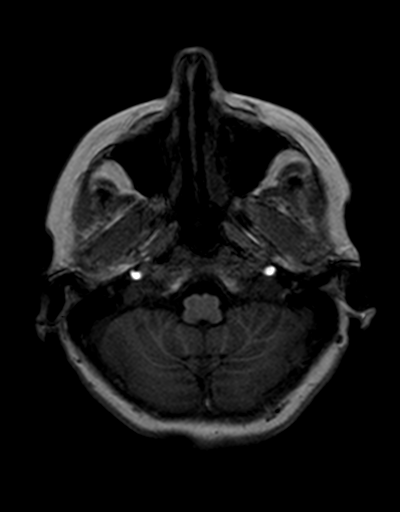
[im 18/72]
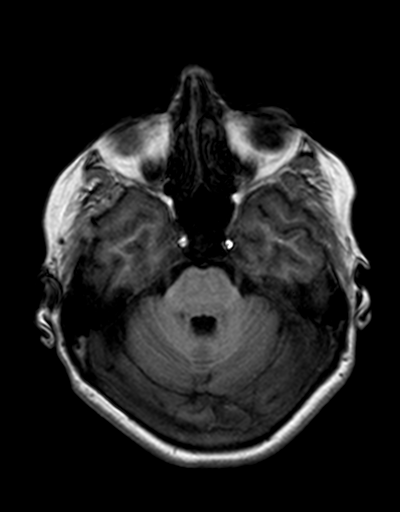
[im 27/72]
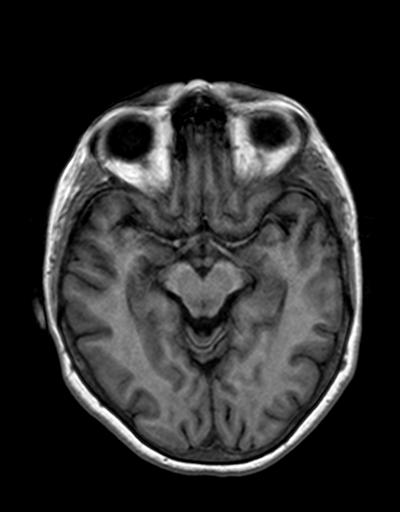
[im 45/72]
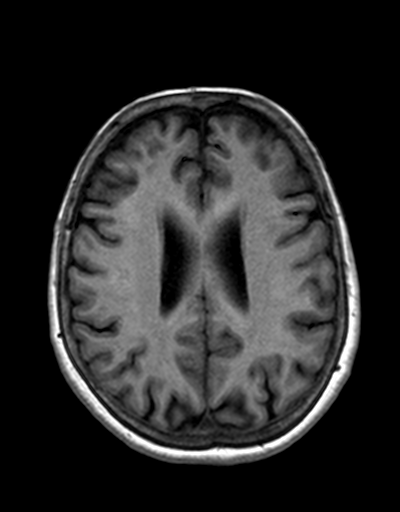
[im 54/72]
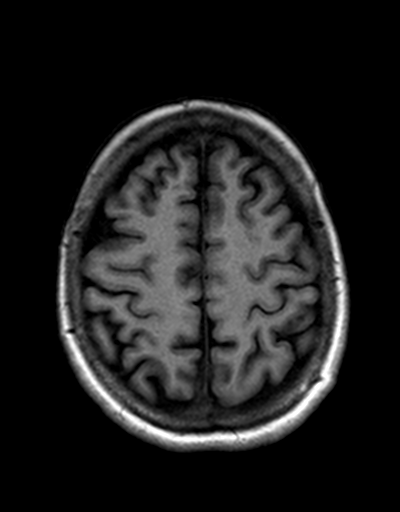
[im 63/72]
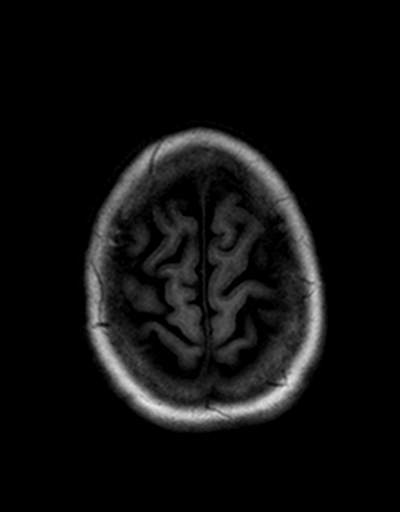
[im 72/72]
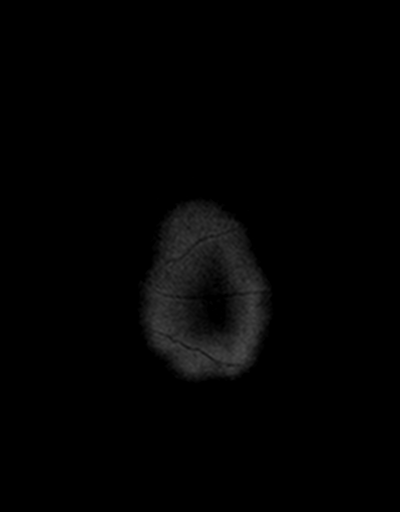

[Series 8: GRE · axial · 5.0mm · 0.45mm/px · z∈[-25,+105]mm · 3 of 21 slices shown]
[im 1/21]
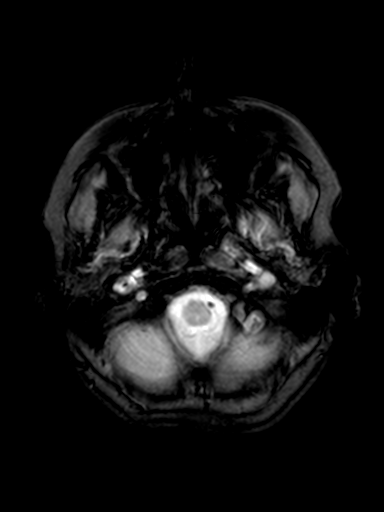
[im 11/21]
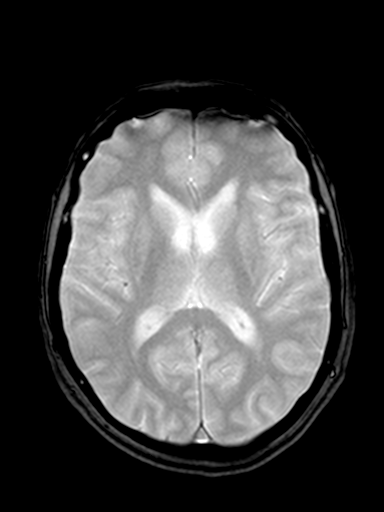
[im 21/21]
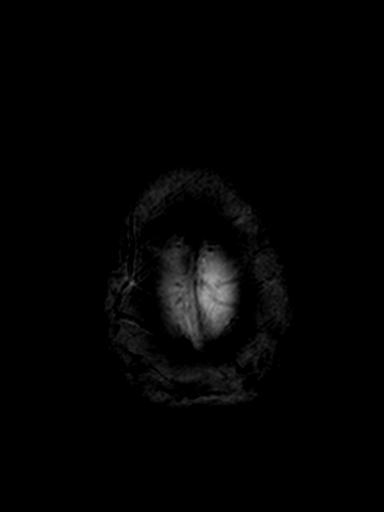

[Series 9: T2 · coronal · non-contrast · 5.0mm · 0.45mm/px · 3 of 22 slices shown]
[im 1/22]
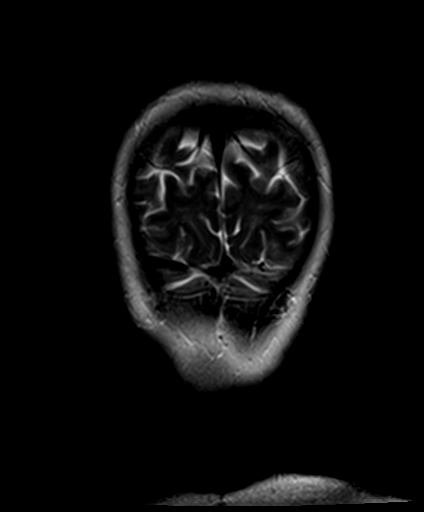
[im 11/22]
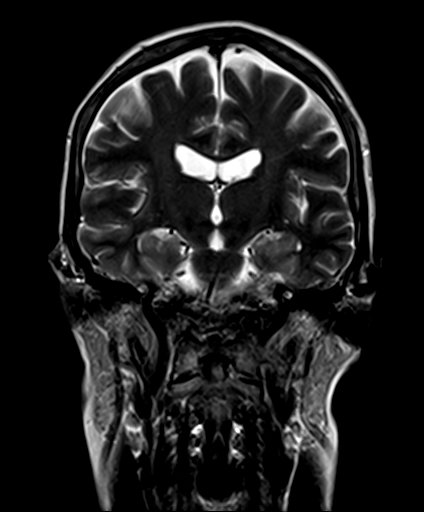
[im 22/22]
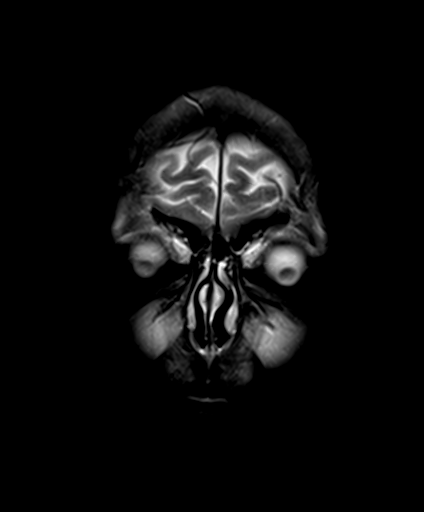

[Series 13: T1 post-contrast · axial · 3.0mm · 0.35mm/px · 1 of 12 slices shown (1 of 2)]
[im 1/12]
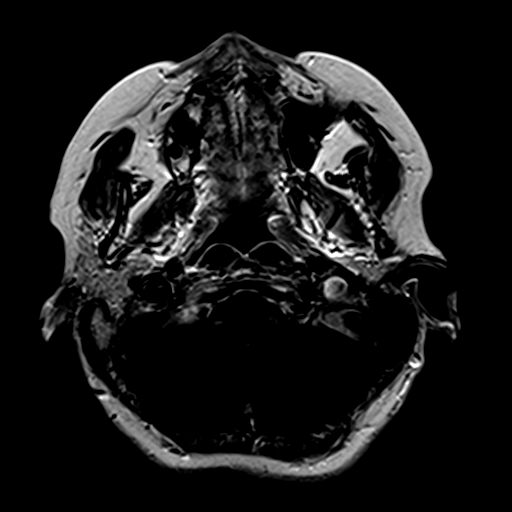

[Series 14: T1 post-contrast · coronal · 3.0mm · 0.35mm/px · 1 of 12 slices shown (2 of 2)]
[im 1/12]
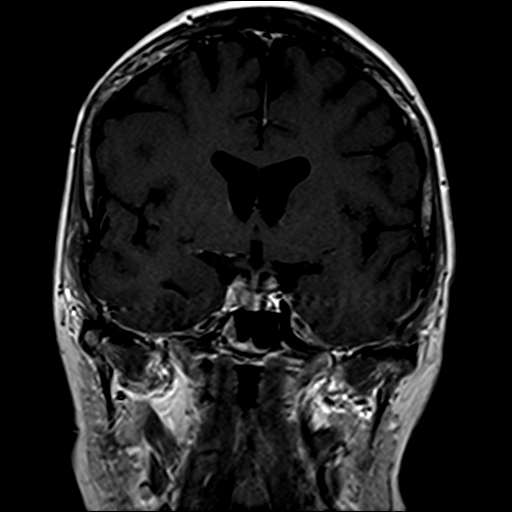

[30 of 48 positions shown; findings below may reference images not displayed]

FINDINGS: None available.  No acute intracranial abnormality is
present.  Specifically, there is no evidence for acute infarct,
hemorrhage, mass, hydrocephalus, or extra-axial fluid collection.
Minimal periventricular subcortical white matter changes likely
within normal limits for age.  Flow is present in the major
intracranial arteries.

Thin cut, high-resolution images demonstrate no definite
enhancement within the labyrinthine structures or vestibule.  No
other pathologic enhancement is seen.  The globes and orbits are
intact.  The paranasal sinuses and mastoid air cells are clear.
IMPRESSION: 1.  No acute intracranial abnormality.
2.  No focal lesion to explain dizziness.

## 2011-03-07 MED ORDER — OMALIZUMAB 150 MG ~~LOC~~ SOLR
150.0000 mg | Freq: Once | SUBCUTANEOUS | Status: AC
  Administered 2011-03-07: 150 mg via SUBCUTANEOUS

## 2011-03-08 NOTE — Telephone Encounter (Signed)
Spoke with patient and advised her that paperwork for [employer] was received and faxed to [employer] this morning. Confirmation received. Pt advised that she should receive notification from [employer] with 7-10 business days. Advised her that if she hasn't received this to call us back. Pt voiced understanding.

## 2011-03-20 ENCOUNTER — Other Ambulatory Visit: Payer: Self-pay | Admitting: Critical Care Medicine

## 2011-04-11 ENCOUNTER — Ambulatory Visit (INDEPENDENT_AMBULATORY_CARE_PROVIDER_SITE_OTHER): Payer: Medicare Other

## 2011-04-11 DIAGNOSIS — J45909 Unspecified asthma, uncomplicated: Secondary | ICD-10-CM

## 2011-04-11 MED ORDER — OMALIZUMAB 150 MG ~~LOC~~ SOLR
150.0000 mg | Freq: Once | SUBCUTANEOUS | Status: AC
  Administered 2011-04-11: 150 mg via SUBCUTANEOUS

## 2011-05-04 ENCOUNTER — Ambulatory Visit (INDEPENDENT_AMBULATORY_CARE_PROVIDER_SITE_OTHER): Payer: Medicare Other

## 2011-05-04 ENCOUNTER — Ambulatory Visit (INDEPENDENT_AMBULATORY_CARE_PROVIDER_SITE_OTHER): Payer: Medicare Other | Admitting: Critical Care Medicine

## 2011-05-04 ENCOUNTER — Encounter: Payer: Self-pay | Admitting: Critical Care Medicine

## 2011-05-04 VITALS — BP 140/78 | HR 71 | Temp 97.7°F | Ht 61.0 in | Wt 103.8 lb

## 2011-05-04 DIAGNOSIS — J45909 Unspecified asthma, uncomplicated: Secondary | ICD-10-CM

## 2011-05-04 MED ORDER — MAGIC MOUTHWASH: 5.0000 mL | Freq: Three times a day (TID) | ORAL | Status: DC | PRN

## 2011-05-04 MED ORDER — AZELASTINE HCL 0.15 % NA SOLN: 1.0000 | Freq: Every day | NASAL | Status: DC

## 2011-05-04 MED ORDER — FEXOFENADINE HCL 180 MG PO TABS: ORAL_TABLET | ORAL | Status: DC

## 2011-05-04 MED ORDER — OMALIZUMAB 150 MG ~~LOC~~ SOLR
300.0000 mg | Freq: Once | SUBCUTANEOUS | Status: AC
  Administered 2011-05-04: 300 mg via SUBCUTANEOUS

## 2011-05-04 MED ORDER — AZITHROMYCIN 250 MG PO TABS: 250.0000 mg | ORAL_TABLET | Freq: Every day | ORAL | Status: DC

## 2011-05-04 NOTE — Patient Instructions (Signed)
Use mouth wash as needed three times a day Hold Allegra for 7days Use nasonex Hold astelin Stay on Xolair Use Dulera 1 puff twice a day Check peak flows twice daily Return 4 months

## 2011-05-04 NOTE — Progress Notes (Signed)
Subjective:    Patient ID: Ebony Scott, female    DOB: August 04, 1950, 61 y.o.   MRN: [id number]  Cough Associated symptoms include shortness of breath.  Shortness of Breath   61 y.o. WF atopic moderate persistent asthma, elevated IgE. Started Xolair 05/03/10  Initial Pulmonary Consultation 12/23/09  She was mowing the lawn yesterday.  She wore a mask.  She developed a cough with yellow to green sputum today.  She also had sinus fullness over her left eye.  She denied fever, wheeze, hemoptysis, chest pain, abdominal pain, rash, or gland swelling.  She has been using dulera one puff bid but not nasal irrigation.  01/31/11 Pt last seen 8/12 by VS for flare . Now is coughing more, notes more sinus drainage.  Has drops on L eye and coughs every evening. Mucus now is gray to yellow.  Changed color two weeks ago. Notes pndrip and ear pain with sinus pressure.  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   PFR are better   05/04/2011 Worse over the past few weeks.  Notes more chest tightness, more dyspnea daily. Had to stop the astelin at night.  Notes facial pressure. Notes some headaches. Notes pn drip and cough up yellow.  Notes more dyspnea up and down steps PFR 280-300   Past Medical History  Diagnosis Date  . Chronic neck pain   . Allergic rhinitis   . Moderate persistent asthma     -FeV1 72% 2011, -IgE 102 2011, CT sinus Neg 2011  . Breast cancer     in remission  . GERD (gastroesophageal reflux disease)   . Hypothyroidism   . Osteoporosis     on reclast yearly  . Hiatal hernia   . Diverticulosis   . Hemorrhoids   . Asthma   . IBS (irritable bowel syndrome)      Family History  Problem Relation Age of Onset  . Allergies Mother   . Allergies Father   . Heart disease Mother   . Heart disease Father   . Arthritis Mother   . Arthritis Father   . Lung cancer Mother   . Colon cancer      Maternal half uncle  . Colon cancer      Maternal half aunt  . Colitis  Daughter     Review of Systems  Respiratory: Positive for cough and shortness of breath.        Objective:   Physical Exam   BP 140/78  Pulse 71  Temp(Src) 97.7 F (36.5 C) (Oral)  Ht 5\' 1"  (1.549 m)  Wt 103 lb 12.8 oz (47.083 kg)  BMI 19.61 kg/m2  SpO2 100%  HEENT - pale nasal mucosa, no sinus tenderness, TM clear, no oral exudate, no LAN Chest - no wheeze/rales Cardiac - s1 s2 regular Abd - soft, nontender Ext - no edema Neuro - normal strength, CN intact Skin - no rash Psych - normal mood/behavior     Assessment & Plan:   Asthma with allergic rhinitis Severe persistent asthma with positive response to Xolair therapy resulting in reduction in exacerbation rate Plan Use mouth wash as needed three times a day Hold Allegra for 7days Use nasonex Hold astelin Stay on Xolair Use Dulera 1 puff twice a day Check peak flows twice daily Return 4 months     note early sinusitis Plan Administer azithromycin for 5 days  Updated Medication List Outpatient Encounter Prescriptions as of 05/04/2011  Medication Sig Dispense Refill  .  acetaminophen (TYLENOL ARTHRITIS PAIN) 650 MG CR tablet Take 1,300 mg by mouth 2 (two) times daily.        . Aloe-Sodium Chloride (AYR SALINE NASAL GEL NA) by Nasal route. Per box        . Alpha-D-Galactosidase (BEANO) TABS Take by mouth. As needed      . Alpha-Lipoic Acid 600 MG CAPS Take by mouth. Once a day       . Alum & Mag Hydroxide-Simeth (MAGIC MOUTHWASH) SOLN Take 5-10 mLs by mouth 3 (three) times daily as needed.  240 mL  3  . Artificial Tear Ointment (REFRESH P.M. OP) Apply to eye. Apply small amount under eye lids at bedtime       . Calcium Citrate-Vitamin D (CITRACAL MAXIMUM) 315-250 MG-UNIT TABS Take by mouth. 1 capsule twice a day       . Cholecalciferol (VITAMIN D) 2000 UNITS CAPS Take by mouth. 1/2 at bedtime       . EPINEPHrine (EPIPEN 2-PAK) 0.3 mg/0.3 mL DEVI As directed       . ESTRACE VAGINAL 0.1 MG/GM vaginal cream  once a week      . fexofenadine (ALLEGRA) 180 MG tablet HOLD for one week      . gabapentin (NEURONTIN) 600 MG tablet Once a day       . hydrocortisone (PROCTOZONE-HC) 2.5 % rectal cream As needed       . lactase (LACTAID) 3000 UNITS tablet 3-4 tabs with dairy liquids and solids       . lidocaine (LIDODERM) 5 % Place 1 patch onto the skin daily as needed. Remove & Discard patch within 12 hours or as directed by MD      . Melatonin 3 MG CAPS Take by mouth. 3 tabs at bedtime      . mometasone (NASONEX) 50 MCG/ACT nasal spray 2 sprays by Nasal route daily.  17 g  6  . mometasone-formoterol (DULERA) 100-5 MCG/ACT AERO Inhale 1 puff into the lungs 2 (two) times daily.      Marland Kitchen MUCINEX D 60-600 MG per tablet TAKE 1 TABLET BY MOUTH TWICE A DAY AS   NEEDED.  72 each  3  . neomycin-bacitracin-polymyxin (TRIPLE ANTIBIOTIC) ophthalmic ointment Apply as needed       . nystatin-triamcinolone (MYCOLOG II) cream Apply as needed       . omalizumab (XOLAIR) 150 MG injection 150 SQ every 4 weeks      . Pancrelipase, Lip-Prot-Amyl, (CREON) 24000 UNITS CPEP Take by mouth. 1 with each meal       . Polyethyl Glycol-Propyl Glycol (SYSTANE ULTRA) 0.4-0.3 % SOLN Apply to eye. 1-2 drops each eye every morning and at bedtime       . Prenatal Vit-DSS-Fe Cbn-FA (VINATE ULTRA) TABS Take by mouth. Once a day       . Probiotic Product (ALIGN) 4 MG CAPS Take by mouth. Once a day       . propylthiouracil (PTU) 50 MG tablet 1 tablet twice a day       . rosuvastatin (CRESTOR) 20 MG tablet Take 10 mg by mouth daily.        . Simethicone (GAS-X EXTRA STRENGTH) 125 MG CAPS Take by mouth. Per box       . zoledronic acid (RECLAST) 5 MG/100ML SOLN Once a year       . DISCONTD: Alum & Mag Hydroxide-Simeth (MAGIC MOUTHWASH) SOLN Take by mouth. Gargle and swallow 1 tsp 4 times a day as needed       .  DISCONTD: Alum & Mag Hydroxide-Simeth (MAGIC MOUTHWASH) SOLN Take 5-10 mLs by mouth 3 (three) times daily.  180 mL  2  . DISCONTD: Alum & Mag  Hydroxide-Simeth (MAGIC MOUTHWASH) SOLN Take 5-10 mLs by mouth 3 (three) times daily as needed.      Marland Kitchen DISCONTD: fexofenadine (ALLEGRA) 180 MG tablet Take 180 mg by mouth daily.        Marland Kitchen DISCONTD: mometasone-formoterol (DULERA) 100-5 MCG/ACT AERO Inhale 2 puffs into the lungs 2 (two) times daily. 1 puff daily  1 Inhaler  6  . DISCONTD: pseudoephedrine-guaifenesin (MUCINEX D) 60-600 MG per tablet        . Azelastine HCl 0.15 % SOLN Place 1 spray into the nose daily. HOLD for two weeks      . azithromycin (ZITHROMAX) 250 MG tablet Take 1 tablet (250 mg total) by mouth daily. Take two once then one daily until gone  6 each  0  . DISCONTD: Azelastine HCl 0.15 % SOLN Place 1 spray into the nose daily.        Marland Kitchen DISCONTD: brimonidine (ALPHAGAN) 0.2 % ophthalmic solution Place 1 drop into the left eye Twice daily.      Marland Kitchen DISCONTD: BROMDAY 0.09 % SOLN Place 1 drop into the left eye Once daily.      Marland Kitchen DISCONTD: methimazole (TAPAZOLE) 5 MG tablet Take 5 mg by mouth 3 (three) times daily.         No facility-administered encounter medications on file as of 05/04/2011.

## 2011-05-04 NOTE — Assessment & Plan Note (Signed)
Severe persistent asthma with positive response to Xolair therapy resulting in reduction in exacerbation rate Plan Use mouth wash as needed three times a day Hold Allegra for 7days Use nasonex Hold astelin Stay on Xolair Use Dulera 1 puff twice a day Check peak flows twice daily Return 4 months

## 2011-05-07 ENCOUNTER — Telehealth: Payer: Self-pay | Admitting: Critical Care Medicine

## 2011-05-07 MED ORDER — AZITHROMYCIN 250 MG PO TABS: 250.0000 mg | ORAL_TABLET | Freq: Every day | ORAL | Status: AC

## 2011-05-07 NOTE — Telephone Encounter (Signed)
I spoke with patient-she is aware to take RX as directed on package-2 on day 1 and 1 there after for 4 days.

## 2011-05-07 NOTE — Telephone Encounter (Signed)
Called and spoke with pt. Pt states she was just seen by PW on Friday 3/15.  States she was dx with sinus infection and given rx for z pak which she will finish tomorrow.  Pt is wondering if she needs another round sent into pharmacy.  States she is still having a lot of pain/pressure in face/eyes, head and chest congestion, coughing up gray to yellow colored sputum, hot flashes but unsure if she has a fever, tightness in chest and increased sob.  Please advise if ok to refill z pak?  Also, pt is wanting to know if she should be using a neti pot.  States PW recommended she use nasal saline spray tid x 2 weeks but was unsure if the netipot would be better.  Should she use this after her nasonex?    Pt also wanted to know how long she "should wait when she gets sick" before contacting the office for an appt.  PW, please advise.  Thanks!

## 2011-05-07 NOTE — Telephone Encounter (Signed)
refil zpak x 1 Neti pot is better, use before nasonex

## 2011-05-07 NOTE — Telephone Encounter (Signed)
I spoke with pt and is aware of pw recs and verbalized understanding and zpak has been sent to the pharmacy

## 2011-05-11 ENCOUNTER — Telehealth: Payer: Self-pay | Admitting: Critical Care Medicine

## 2011-05-11 NOTE — Telephone Encounter (Signed)
Pt advised and she states she will continue with meds and see how she feels next week. Carron Curie, CMA

## 2011-05-11 NOTE — Telephone Encounter (Signed)
No other ideas.  ?urgent care ? Work in [employer] this PM   Use neti pot only once daily

## 2011-05-11 NOTE — Telephone Encounter (Signed)
Pt still having SOB, no worse, chest tightness,ears feel full, prod cough. She says bloody nasal drainage is better. Using humidifier at night, Netti Pot before Nasonex and on 3rd day of zpak. She wants to know if she should be using Netti Pot twice daily or just once and if PW has any other recs because she feels she is not improving. Pt has not started the Allegra. Pls advise. Allergies  Allergen Reactions  . Dust Mite Extract   . Eggs Or Egg-Derived Products   . Hydrocodone-Acetaminophen     REACTION: unspecified  . Levofloxacin     REACTION: GI upset  . Milk-Related Compounds   . Molds & Smuts   . Morphine Sulfate     REACTION: pruritis  . Oxycodone Hcl   . Paroxetine     REACTION: unspecified  . Penicillins     REACTION: rash  . Rofecoxib     REACTION: feet swelling  . Shrimp Flavor   . Soy Allergy     Limited soy d/t cancer  . Strawberry   . Tetracycline Hcl     REACTION: unspecified  . Tree Extract

## 2011-05-17 ENCOUNTER — Telehealth: Payer: Self-pay | Admitting: Critical Care Medicine

## 2011-05-17 MED ORDER — CLARITHROMYCIN 500 MG PO TABS: 500.0000 mg | ORAL_TABLET | Freq: Two times a day (BID) | ORAL | Status: AC

## 2011-05-17 NOTE — Telephone Encounter (Signed)
Choice: see t parrett today  Or call in biaxin 500mg  bid x 7days

## 2011-05-17 NOTE — Telephone Encounter (Signed)
I spoke with pt and she would like the abx called in for her and if she is not feeling any better she will call for ov. I have sent rx into the pharmacy and needed nothing further

## 2011-05-17 NOTE — Telephone Encounter (Signed)
Pt c/o sinus pain and pressure on the left side of her face, still has blood-streaked nasal drainage. She finished the Zpak on Sun., 3/24 and is still using the [employer] daily and Nasonex. She has not started Astepro. She says her left ear also feels full, like there is fluid in there. Pls advise. Allergies  Allergen Reactions  . Dust Mite Extract   . Eggs Or Egg-Derived Products   . Hydrocodone-Acetaminophen     REACTION: unspecified  . Levofloxacin     REACTION: GI upset  . Milk-Related Compounds   . Molds & Smuts   . Morphine Sulfate     REACTION: pruritis  . Oxycodone Hcl   . Paroxetine     REACTION: unspecified  . Penicillins     REACTION: rash  . Rofecoxib     REACTION: feet swelling  . Shrimp Flavor   . Soy Allergy     Limited soy d/t cancer  . Strawberry   . Tetracycline Hcl     REACTION: unspecified  . Tree Extract

## 2011-05-22 ENCOUNTER — Other Ambulatory Visit: Payer: Self-pay | Admitting: Obstetrics and Gynecology

## 2011-05-22 DIAGNOSIS — Z1231 Encounter for screening mammogram for malignant neoplasm of breast: Secondary | ICD-10-CM

## 2011-05-29 ENCOUNTER — Ambulatory Visit (HOSPITAL_BASED_OUTPATIENT_CLINIC_OR_DEPARTMENT_OTHER)
Admission: RE | Admit: 2011-05-29 | Discharge: 2011-05-29 | Disposition: A | Discharge status: Home / Self Care | Payer: Medicare Other | Source: Ambulatory Visit | Attending: Adult Health | Admitting: Adult Health

## 2011-05-29 ENCOUNTER — Ambulatory Visit (INDEPENDENT_AMBULATORY_CARE_PROVIDER_SITE_OTHER): Payer: Medicare Other | Admitting: Adult Health

## 2011-05-29 ENCOUNTER — Telehealth: Payer: Self-pay | Admitting: Critical Care Medicine

## 2011-05-29 ENCOUNTER — Other Ambulatory Visit: Payer: Self-pay | Admitting: Adult Health

## 2011-05-29 ENCOUNTER — Encounter: Payer: Self-pay | Admitting: Adult Health

## 2011-05-29 DIAGNOSIS — R059 Cough, unspecified: Secondary | ICD-10-CM

## 2011-05-29 DIAGNOSIS — R935 Abnormal findings on diagnostic imaging of other abdominal regions, including retroperitoneum: Secondary | ICD-10-CM

## 2011-05-29 DIAGNOSIS — R05 Cough: Secondary | ICD-10-CM | POA: Insufficient documentation

## 2011-05-29 DIAGNOSIS — Z853 Personal history of malignant neoplasm of breast: Secondary | ICD-10-CM

## 2011-05-29 DIAGNOSIS — J45909 Unspecified asthma, uncomplicated: Secondary | ICD-10-CM

## 2011-05-29 DIAGNOSIS — R9389 Abnormal findings on diagnostic imaging of other specified body structures: Secondary | ICD-10-CM

## 2011-05-29 MED ORDER — CEFDINIR 300 MG PO CAPS: 300.0000 mg | ORAL_CAPSULE | Freq: Two times a day (BID) | ORAL | Status: AC

## 2011-05-29 MED ORDER — FIRST-DUKES MOUTHWASH MT SUSP: OROMUCOSAL | Status: DC

## 2011-05-29 MED ORDER — PREDNISONE 10 MG PO TABS: ORAL_TABLET | ORAL | Status: DC

## 2011-05-29 NOTE — Assessment & Plan Note (Signed)
Recurrent flare with associated sinusitis and bronchitis Check xray today  If not improving after abx, would repeat CT sinus and /or refer to ENT   Plan;  Omnicef 300mg  Twice daily  For 10 days  Mucinex DM Twice daily  As needed  Cough/congestion  Saline nasal rinses As needed   Prednisone taper over next week.  Please contact office for sooner follow up if symptoms do not improve or worsen or seek emergency care  follow up Dr. Delford Field  As planned and As needed   Increase Dulera 200/64mcg 2 puffs Twice daily  Until sample is done then back to dose 2 puffs Twice daily

## 2011-05-29 NOTE — Progress Notes (Signed)
Orders only encounter to order the following:  Result Note     Will need to set up for CT chest with IV contrast to evaluate ? Left lung mass  Pt aware .  Please call with time/date.  Please send [employer] Mouthwash # 8 oz 1 tsp every 6 hr As needed , swish/swallow , 1 refill

## 2011-05-29 NOTE — Telephone Encounter (Signed)
I spoke with pt and she stated she is very SOB. I advised pt PW was not in the office but TP was in our [facility] office if she would like to go out there and see her this AM. She is scheduled to see her at 11:30 for an evaluation I gave pt directions and phone # just in case. She voiced her understanding and needed nothing further

## 2011-05-29 NOTE — Progress Notes (Signed)
Subjective:    Patient ID: Ebony Scott, female    DOB: 10/02/1950, 61 y.o.   MRN: [id number]  Cough Associated symptoms include shortness of breath.  Shortness of Breath   61 y.o. WF atopic moderate persistent asthma, elevated IgE. Started Xolair 05/03/10  Initial Pulmonary Consultation 12/23/09  She was mowing the lawn yesterday.  She wore a mask.  She developed a cough with yellow to green sputum today.  She also had sinus fullness over her left eye.  She denied fever, wheeze, hemoptysis, chest pain, abdominal pain, rash, or gland swelling.  She has been using dulera one puff bid but not nasal irrigation.  01/31/11 Pt last seen 8/12 by VS for flare . Now is coughing more, notes more sinus drainage.  Has drops on L eye and coughs every evening. Mucus now is gray to yellow.  Changed color two weeks ago. Notes pndrip and ear pain with sinus pressure.  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   PFR are better   05/04/11  Worse over the past few weeks.  Notes more chest tightness, more dyspnea daily. Had to stop the astelin at night.  Notes facial pressure. Notes some headaches. Notes pn drip and cough up yellow.  Notes more dyspnea up and down steps PFR 280-300   >>Rx zpack   05/29/2011 Acute OV  Complains of sob, sinus pressure, nose bleeds, and cough with yellow/green mucus mostly in the morning. Also pain under right breast and right side of her back that started x 1 week ago.  OTC not helping. Finished zpack with some help but never cleared completely.  Had CT sinus in 12/2009 which was nml.  No hemoptysis , chest pain or hemoptysis.      Past Medical History  Diagnosis Date  . Chronic neck pain   . Allergic rhinitis   . Moderate persistent asthma     -FeV1 72% 2011, -IgE 102 2011, CT sinus Neg 2011  . Breast cancer     in remission  . GERD (gastroesophageal reflux disease)   . Hypothyroidism   . Osteoporosis     on reclast yearly  . Hiatal hernia   .  Diverticulosis   . Hemorrhoids   . Asthma   . IBS (irritable bowel syndrome)      Family History  Problem Relation Age of Onset  . Allergies Mother   . Allergies Father   . Heart disease Mother   . Heart disease Father   . Arthritis Mother   . Arthritis Father   . Lung cancer Mother   . Colon cancer      Maternal half uncle  . Colon cancer      Maternal half aunt  . Colitis Daughter     Review of Systems  Respiratory: Positive for cough and shortness of breath.   Constitutional:   No  weight loss, night sweats,  Fevers, chills,  +fatigue, or  lassitude.  HEENT:   No headaches,  Difficulty swallowing,  Tooth/dental problems, or  Sore throat,                No sneezing, itching, ear ache,  +nasal congestion, post nasal drip,   CV:  No chest pain,  Orthopnea, PND, swelling in lower extremities, anasarca, dizziness, palpitations, syncope.   GI  No heartburn, indigestion, abdominal pain, nausea, vomiting, diarrhea, change in bowel habits, loss of appetite, bloody stools.   Resp:  ,  No coughing up  of blood.   No chest wall deformity  Skin: no rash or lesions.  GU: no dysuria, change in color of urine, no urgency or frequency.  No flank pain, no hematuria   MS:  No joint pain or swelling.  No decreased range of motion.  No back pain.  Psych:  No change in mood or affect. No depression or anxiety.  No memory loss.    '     Objective:   Physical Exam   BP 128/70  Pulse 69  Temp(Src) 97.7 F (36.5 C) (Oral)  Ht 5\' 1"  (1.549 m)  Wt 104 lb (47.174 kg)  BMI 19.65 kg/m2  SpO2 97%  HEENT - pale nasal mucosa, no sinus tenderness, TM clear, no oral exudate, no LAN Chest - coarse BS w/ no wheezing.  Cardiac - s1 s2 regular Abd - soft, nontender Ext - no edema Neuro - normal strength,  Skin - no rash Psych - normal mood/behavior     Assessment & Plan:   No problem-specific assessment & plan notes found for this encounter.   Updated Medication List Outpatient  Encounter Prescriptions as of 05/29/2011  Medication Sig Dispense Refill  . acetaminophen (TYLENOL ARTHRITIS PAIN) 650 MG CR tablet Take 1,300 mg by mouth 2 (two) times daily.        . Aloe-Sodium Chloride (AYR SALINE NASAL GEL NA) by Nasal route. Per box        . Alpha-D-Galactosidase (BEANO) TABS Take by mouth. As needed      . Alpha-Lipoic Acid 600 MG CAPS Take by mouth. Once a day       . Alum & Mag Hydroxide-Simeth (MAGIC MOUTHWASH) SOLN Take 5-10 mLs by mouth 3 (three) times daily as needed.  240 mL  3  . Artificial Tear Ointment (REFRESH P.M. OP) Apply to eye. Apply small amount under eye lids at bedtime       . Azelastine HCl 0.15 % SOLN Place 1 spray into the nose daily.      . Calcium Citrate-Vitamin D (CITRACAL MAXIMUM) 315-250 MG-UNIT TABS Take by mouth. 1 capsule twice a day       . Cholecalciferol (VITAMIN D) 2000 UNITS CAPS Take by mouth. 1/2 at bedtime       . EPINEPHrine (EPIPEN 2-PAK) 0.3 mg/0.3 mL DEVI As directed       . ESTRACE VAGINAL 0.1 MG/GM vaginal cream once a week      . fexofenadine (ALLEGRA) 180 MG tablet       . gabapentin (NEURONTIN) 600 MG tablet Once a day       . hydrocortisone (PROCTOZONE-HC) 2.5 % rectal cream As needed       . lactase (LACTAID) 3000 UNITS tablet 3-4 tabs with dairy liquids and solids       . lidocaine (LIDODERM) 5 % Place 1 patch onto the skin daily as needed. Remove & Discard patch within 12 hours or as directed by MD      . Melatonin 3 MG CAPS Take by mouth. 3 tabs at bedtime      . mometasone (NASONEX) 50 MCG/ACT nasal spray 2 sprays by Nasal route daily.  17 g  6  . mometasone-formoterol (DULERA) 100-5 MCG/ACT AERO Inhale 1 puff into the lungs 2 (two) times daily.      Marland Kitchen MUCINEX D 60-600 MG per tablet TAKE 1 TABLET BY MOUTH TWICE A DAY AS   NEEDED.  72 each  3  . neomycin-bacitracin-polymyxin (TRIPLE ANTIBIOTIC) ophthalmic ointment Apply  as needed       . nystatin-triamcinolone (MYCOLOG II) cream Apply as needed       . omalizumab  (XOLAIR) 150 MG injection 150 SQ every 4 weeks      . Pancrelipase, Lip-Prot-Amyl, (CREON) 24000 UNITS CPEP Take by mouth. 1 with each meal       . Polyethyl Glycol-Propyl Glycol (SYSTANE ULTRA) 0.4-0.3 % SOLN Apply to eye. 1-2 drops each eye every morning and at bedtime       . Prenatal Vit-DSS-Fe Cbn-FA (VINATE ULTRA) TABS Take by mouth. Once a day       . Probiotic Product (ALIGN) 4 MG CAPS Take by mouth. Once a day       . propylthiouracil (PTU) 50 MG tablet 1 tablet twice a day       . rosuvastatin (CRESTOR) 20 MG tablet Take 10 mg by mouth daily.        . Simethicone (GAS-X EXTRA STRENGTH) 125 MG CAPS Take by mouth. Per box       . zoledronic acid (RECLAST) 5 MG/100ML SOLN Once a year       . DISCONTD: Azelastine HCl 0.15 % SOLN Place 1 spray into the nose daily. HOLD for two weeks      . DISCONTD: fexofenadine (ALLEGRA) 180 MG tablet HOLD for one week      . clarithromycin (BIAXIN) 500 MG tablet Take 1 tablet (500 mg total) by mouth 2 (two) times daily.  14 tablet  0

## 2011-05-29 NOTE — Patient Instructions (Signed)
Omnicef 300mg  Twice daily  For 10 days  Mucinex DM Twice daily  As needed  Cough/congestion  Saline nasal rinses As needed   Prednisone taper over next week.  Please contact office for sooner follow up if symptoms do not improve or worsen or seek emergency care  follow up Dr. Delford Field  As planned and As needed   Increase Dulera 200/5mcg 2 puffs Twice daily  Until sample is done then back to dose 2 puffs Twice daily

## 2011-05-30 ENCOUNTER — Telehealth: Payer: Self-pay | Admitting: *Deleted

## 2011-05-30 ENCOUNTER — Other Ambulatory Visit (INDEPENDENT_AMBULATORY_CARE_PROVIDER_SITE_OTHER): Payer: Medicare Other

## 2011-05-30 DIAGNOSIS — R9389 Abnormal findings on diagnostic imaging of other specified body structures: Secondary | ICD-10-CM

## 2011-05-30 DIAGNOSIS — R918 Other nonspecific abnormal finding of lung field: Secondary | ICD-10-CM

## 2011-05-30 LAB — BASIC METABOLIC PANEL
BUN: 23 mg/dL (ref 6–23)
CO2: 26 mEq/L (ref 19–32)
Calcium: 9.7 mg/dL (ref 8.4–10.5)
Chloride: 107 mEq/L (ref 96–112)
Creatinine, Ser: 0.9 mg/dL (ref 0.4–1.2)
GFR: 70.78 mL/min (ref >60.00)
Glucose, Bld: 97 mg/dL (ref 70–99)
Potassium: 3.6 mEq/L (ref 3.5–5.1)
Sodium: 144 mEq/L (ref 135–145)

## 2011-05-30 NOTE — Telephone Encounter (Signed)
Patient called and left voice message stating she was seen by Tammy Parrett yesterday. Her message states that she has a yeast infection and she would like to know if a Rx could be sent to her pharmacy.

## 2011-05-31 ENCOUNTER — Other Ambulatory Visit: Payer: Self-pay | Admitting: Adult Health

## 2011-05-31 ENCOUNTER — Ambulatory Visit (INDEPENDENT_AMBULATORY_CARE_PROVIDER_SITE_OTHER)
Admission: RE | Admit: 2011-05-31 | Discharge: 2011-05-31 | Disposition: A | Discharge status: Home / Self Care | Payer: Medicare Other | Source: Ambulatory Visit | Attending: Adult Health | Admitting: Adult Health

## 2011-05-31 ENCOUNTER — Telehealth: Payer: Self-pay | Admitting: Critical Care Medicine

## 2011-05-31 ENCOUNTER — Encounter: Payer: Self-pay | Admitting: Adult Health

## 2011-05-31 DIAGNOSIS — J189 Pneumonia, unspecified organism: Secondary | ICD-10-CM | POA: Insufficient documentation

## 2011-05-31 DIAGNOSIS — R918 Other nonspecific abnormal finding of lung field: Secondary | ICD-10-CM

## 2011-05-31 DIAGNOSIS — R9389 Abnormal findings on diagnostic imaging of other specified body structures: Secondary | ICD-10-CM

## 2011-05-31 MED ORDER — MOXIFLOXACIN HCL 400 MG PO TABS: 400.0000 mg | ORAL_TABLET | Freq: Every day | ORAL | Status: AC

## 2011-05-31 MED ORDER — IOHEXOL 300 MG/ML  SOLN
80.0000 mL | Freq: Once | INTRAMUSCULAR | Status: AC | PRN
  Administered 2011-05-31: 80 mL via INTRAVENOUS

## 2011-05-31 NOTE — Telephone Encounter (Signed)
Spoke with pt and she called her GYN and has an rx for the diflucan.  Nothing further needed.

## 2011-05-31 NOTE — Telephone Encounter (Signed)
Called and spoke with pt and she just wanted to make sure that it was ok to cont to use the delsym for the cough.  Explained to the pt to follow the bottle instructions for the delsym.  She also stated that once the rain blows over that she may feel up to getting out and doing a little walking.  She wanted to make PW aware.  Will sign and forward this message to PW.

## 2011-05-31 NOTE — Telephone Encounter (Signed)
LMOM for pt TCB 

## 2011-05-31 NOTE — Telephone Encounter (Signed)
Yeast infection ?Vaginal   If yes Diflucan 150mg  x 1 dose # 1 no refills If not improving will need GYN follow up  Please contact office for sooner follow up if symptoms do not improve or worsen or seek emergency care

## 2011-06-01 ENCOUNTER — Telehealth: Payer: Self-pay | Admitting: Critical Care Medicine

## 2011-06-01 NOTE — Telephone Encounter (Signed)
We do not know the pna type She should stop the neti pot and astelin She should stay inside for the weekend

## 2011-06-01 NOTE — Telephone Encounter (Signed)
Called and spoke with pt and she had some questions about her PNA.  She wanted to know what type of bacteria she has and if she should stay inside and not be around people.  She stated that  She was confused about the PNA and if she has bacterial or not.  She has been using the neti pot but this has started to burn in the last few days and wanted to know if this was normal with the PNA.  She also stated that she has stopped the astelin due to this burning her nose. Pt requested that these questions be forwarded to PW and she is aware that he is not in the office today and it may be Monday before she gets a call back and was ok with this.  PW please advise.  Thanks  Allergies  Allergen Reactions  . Dust Mite Extract   . Eggs Or Egg-Derived Products   . Hydrocodone-Acetaminophen     REACTION: unspecified  . Levofloxacin     REACTION: GI upset  . Milk-Related Compounds   . Molds & Smuts   . Morphine Sulfate     REACTION: pruritis  . Oxycodone Hcl   . Paroxetine     REACTION: unspecified  . Penicillins     REACTION: rash  . Rofecoxib     REACTION: feet swelling  . Shrimp Flavor   . Soy Allergy     Limited soy d/t cancer  . Strawberry   . Tetracycline Hcl     REACTION: unspecified  . Tree Extract

## 2011-06-01 NOTE — Telephone Encounter (Signed)
Spoke with pt and notified of recs per PW. Pt verbalized understanding and states nothing further needed.  

## 2011-06-05 ENCOUNTER — Encounter: Payer: Self-pay | Admitting: Adult Health

## 2011-06-05 ENCOUNTER — Ambulatory Visit (INDEPENDENT_AMBULATORY_CARE_PROVIDER_SITE_OTHER): Payer: Medicare Other | Admitting: Adult Health

## 2011-06-05 ENCOUNTER — Ambulatory Visit (HOSPITAL_BASED_OUTPATIENT_CLINIC_OR_DEPARTMENT_OTHER)
Admission: RE | Admit: 2011-06-05 | Discharge: 2011-06-05 | Disposition: A | Discharge status: Home / Self Care | Payer: Medicare Other | Source: Ambulatory Visit | Attending: Adult Health | Admitting: Adult Health

## 2011-06-05 ENCOUNTER — Telehealth: Payer: Self-pay | Admitting: Critical Care Medicine

## 2011-06-05 VITALS — BP 122/70 | HR 78 | Temp 97.8°F | Ht 61.0 in | Wt 108.0 lb

## 2011-06-05 DIAGNOSIS — R0602 Shortness of breath: Secondary | ICD-10-CM | POA: Insufficient documentation

## 2011-06-05 DIAGNOSIS — Z09 Encounter for follow-up examination after completed treatment for conditions other than malignant neoplasm: Secondary | ICD-10-CM | POA: Insufficient documentation

## 2011-06-05 DIAGNOSIS — J449 Chronic obstructive pulmonary disease, unspecified: Secondary | ICD-10-CM

## 2011-06-05 DIAGNOSIS — J4489 Other specified chronic obstructive pulmonary disease: Secondary | ICD-10-CM | POA: Insufficient documentation

## 2011-06-05 DIAGNOSIS — J189 Pneumonia, unspecified organism: Secondary | ICD-10-CM

## 2011-06-05 NOTE — Assessment & Plan Note (Addendum)
Bilateral pneumonia  Patient is having a slow recovery after a prolonged asthmatic exacerbation. She has now finished her steroid taper. She is continuing her asthmatic regimen. On the chest x-ray today. Her infiltrates have completely cleared. Have advised her to finish her Avelox to 2 persistent symptomology. Continue with good nutrition, sleep, and advance activity as tolerated. She is to follow back up with Dr. Delford Field in 2 weeks and as needed.

## 2011-06-05 NOTE — Telephone Encounter (Signed)
Called, spoke with pt.  Pt states she finished the prednisone this morning and has taken 5 days of avelox but still hasn't improved "much if any."  Reports she has a heavy feeling in chest, chest and shoulders feel tender to touch, coughing but not much - has been taking delsym and mucinex dm, feels weak, feels hot but not sure if this is from age or fever, and still having SOB.  OV scheduled with TP for this am at 10:15 in [place] -- pt aware.  Pt also has questions regarding xolair injection scheduled for tomorrow -- advised to discuss this with TP during visit.

## 2011-06-05 NOTE — Progress Notes (Signed)
Subjective:    Patient ID: Ebony Scott, female    DOB: 08-26-1950, 61 y.o.   MRN: [id number]  Cough Associated symptoms include shortness of breath.  Shortness of Breath   61 y.o. WF atopic moderate persistent asthma, elevated IgE. Started Xolair 05/03/10  Initial Pulmonary Consultation 12/23/09  She was mowing the lawn yesterday.  She wore a mask.  She developed a cough with yellow to green sputum today.  She also had sinus fullness over her left eye.  She denied fever, wheeze, hemoptysis, chest pain, abdominal pain, rash, or gland swelling.  She has been using dulera one puff bid but not nasal irrigation.  01/31/11 Pt last seen 8/12 by VS for flare . Now is coughing more, notes more sinus drainage.  Has drops on L eye and coughs every evening. Mucus now is gray to yellow.  Changed color two weeks ago. Notes pndrip and ear pain with sinus pressure.  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   PFR are better   05/04/11  Worse over the past few weeks.  Notes more chest tightness, more dyspnea daily. Had to stop the astelin at night.  Notes facial pressure. Notes some headaches. Notes pn drip and cough up yellow.  Notes more dyspnea up and down steps PFR 280-300   >>Rx zpack   05/29/11  Acute OV  Complains of sob, sinus pressure, nose bleeds, and cough with yellow/green mucus mostly in the morning. Also pain under right breast and right side of her back that started x 1 week ago.  OTC not helping. Finished zpack with some help but never cleared completely.  Had CT sinus in 12/2009 which was nml.  No hemoptysis , chest pain or hemoptysis.  >>CXR done , abn >CT chest w/ multilobar PNA >Rx Avelox   06/05/2011 Acute OV  Returns for persistent symptoms with cough , congestion . The patient was seen last week for asthmatic exacerbation. Despite having multiple antibiotic therapies. Chest x-ray showed a left-sided consolidation. A subsequent CT of the chest was done and showed multi  lobar infiltrates, consistent with pneumonia. Patient was treated with a ten-day course of Avelox along with a prednisone taper. A. She returns today reporting that she is feeling some better however, continues to feel very weak, with achiness along her shoulders, neck. She still has not regained her energy and feels like her taste buds are not back to normal. She denies any hemoptysis, chest pain, orthopnea, PND, or leg swelling. Patient has a good appetite, with no nausea, vomiting. Chest x-ray today, shows complete clearance of the, pulmonary infiltrates.    Past Medical History  Diagnosis Date  . Chronic neck pain   . Allergic rhinitis   . Moderate persistent asthma     -FeV1 72% 2011, -IgE 102 2011, CT sinus Neg 2011  . Breast cancer     in remission  . GERD (gastroesophageal reflux disease)   . Hypothyroidism   . Osteoporosis     on reclast yearly  . Hiatal hernia   . Diverticulosis   . Hemorrhoids   . Asthma   . IBS (irritable bowel syndrome)      Family History  Problem Relation Age of Onset  . Allergies Mother   . Allergies Father   . Heart disease Mother   . Heart disease Father   . Arthritis Mother   . Arthritis Father   . Lung cancer Mother   . Colon cancer  Maternal half uncle  . Colon cancer      Maternal half aunt  . Colitis Daughter     Review of Systems  Respiratory: Positive for cough and shortness of breath.   Constitutional:   No  weight loss, night sweats,  Fevers, chills,  +fatigue, or  lassitude.  HEENT:   No headaches,  Difficulty swallowing,  Tooth/dental problems, or  Sore throat,                No sneezing, itching, ear ache,  +nasal congestion, post nasal drip,   CV:  No chest pain,  Orthopnea, PND, swelling in lower extremities, anasarca, dizziness, palpitations, syncope.   GI  No heartburn, indigestion, abdominal pain, nausea, vomiting, diarrhea, change in bowel habits, loss of appetite, bloody stools.   Resp:  ,  No coughing up of  blood.   No chest wall deformity  Skin: no rash or lesions.  GU: no dysuria, change in color of urine, no urgency or frequency.  No flank pain, no hematuria   MS:  No joint pain or swelling.  No decreased range of motion.  No back pain.  Psych:  No change in mood or affect. No depression or anxiety.  No memory loss.    '     Objective:   Physical Exam   BP 122/70  Pulse 78  Temp(Src) 97.8 F (36.6 C) (Oral)  Ht 5\' 1"  (1.549 m)  Wt 108 lb (48.988 kg)  BMI 20.41 kg/m2  SpO2 100%  HEENT - pale nasal mucosa, no sinus tenderness, TM clear, no oral exudate, no LAN Chest - coarse BS w/ no wheezing.  Cardiac - s1 s2 regular Abd - soft, nontender Ext - no edema Neuro - normal strength,  Skin - no rash Psych - normal mood/behavior     Assessment & Plan:   No problem-specific assessment & plan notes found for this encounter.   Updated Medication List Outpatient Encounter Prescriptions as of 06/05/2011  Medication Sig Dispense Refill  . acetaminophen (TYLENOL ARTHRITIS PAIN) 650 MG CR tablet Take 1,300 mg by mouth 2 (two) times daily.        . Aloe-Sodium Chloride (AYR SALINE NASAL GEL NA) by Nasal route. Per box        . Alpha-D-Galactosidase (BEANO) TABS Take by mouth daily.       . Alpha-Lipoic Acid 600 MG CAPS Take by mouth. Once a day       . Alum & Mag Hydroxide-Simeth (MAGIC MOUTHWASH) SOLN Take 5-10 mLs by mouth 3 (three) times daily as needed.  240 mL  3  . Artificial Tear Ointment (REFRESH P.M. OP) Apply to eye. Apply small amount under eye lids at bedtime       . Azelastine HCl 0.15 % SOLN Place 1 spray into the nose daily.      . Calcium Citrate-Vitamin D (CITRACAL MAXIMUM) 315-250 MG-UNIT TABS Take by mouth. 1 capsule twice a day       . Cholecalciferol (VITAMIN D) 2000 UNITS CAPS Take by mouth. 1/2 at bedtime       . EPINEPHrine (EPIPEN 2-PAK) 0.3 mg/0.3 mL DEVI As directed       . ESTRACE VAGINAL 0.1 MG/GM vaginal cream once a week      . fexofenadine  (ALLEGRA) 180 MG tablet Take 180 mg by mouth daily.       Marland Kitchen gabapentin (NEURONTIN) 600 MG tablet Once a day       . hydrocortisone (PROCTOZONE-HC)  2.5 % rectal cream As needed       . lactase (LACTAID) 3000 UNITS tablet 3-4 tabs with dairy liquids and solids       . lidocaine (LIDODERM) 5 % Place 1 patch onto the skin daily as needed. Remove & Discard patch within 12 hours or as directed by MD      . Melatonin 3 MG CAPS Take 9 mg by mouth. 3 tabs at bedtime      . mometasone (NASONEX) 50 MCG/ACT nasal spray 2 sprays by Nasal route daily.  17 g  6  . mometasone-formoterol (DULERA) 100-5 MCG/ACT AERO Inhale 1 puff into the lungs 2 (two) times daily.      Marland Kitchen moxifloxacin (AVELOX) 400 MG tablet Take 1 tablet (400 mg total) by mouth daily.  10 tablet  0  . neomycin-bacitracin-polymyxin (TRIPLE ANTIBIOTIC) ophthalmic ointment Apply as needed       . nystatin-triamcinolone (MYCOLOG II) cream Apply as needed       . omalizumab (XOLAIR) 150 MG injection 150 SQ every 4 weeks      . Pancrelipase, Lip-Prot-Amyl, (CREON) 24000 UNITS CPEP Take by mouth. 1 with each meal       . Polyethyl Glycol-Propyl Glycol (SYSTANE ULTRA) 0.4-0.3 % SOLN Apply to eye. 1-2 drops each eye every morning and at bedtime       . Prenatal Vit-DSS-Fe Cbn-FA (VINATE ULTRA) TABS Take by mouth. Once a day       . Probiotic Product (ALIGN) 4 MG CAPS Take by mouth. Once a day       . propylthiouracil (PTU) 50 MG tablet 1 tablet twice a day       . Pseudoephedrine HCl (SUDAFED PO) Take by mouth daily.      . pseudoephedrine-guaifenesin (MUCINEX D) 60-600 MG per tablet       . rosuvastatin (CRESTOR) 20 MG tablet Take 10 mg by mouth daily.        . Simethicone (GAS-X EXTRA STRENGTH) 125 MG CAPS Take by mouth. Per box       . zoledronic acid (RECLAST) 5 MG/100ML SOLN Once a year       . DISCONTD: MUCINEX D 60-600 MG per tablet TAKE 1 TABLET BY MOUTH TWICE A DAY AS   NEEDED.  72 each  3  . cefdinir (OMNICEF) 300 MG capsule Take 1 capsule  (300 mg total) by mouth 2 (two) times daily.  20 capsule  0  . Diphenhyd-Hydrocort-Nystatin (FIRST-DUKES MOUTHWASH) SUSP Swish, gargle and swallow every 6 hours as needed.  240 mL  1  . predniSONE (DELTASONE) 10 MG tablet 4 tabs for 2 days, then 3 tabs for 2 days, 2 tabs for 2 days, then 1 tab for 2 days, then stop  20 tablet  0

## 2011-06-05 NOTE — Patient Instructions (Signed)
Finish Avelox .  Mucinex DM Twice daily  As needed  Cough/congestion  Saline nasal rinses As needed   Increase fluid intake .  Advance activity as tolerated.  Please contact office for sooner follow up if symptoms do not improve or worsen or seek emergency care  follow up Dr. Delford Field  As planned in 2 weeks and As needed

## 2011-06-06 ENCOUNTER — Ambulatory Visit: Payer: Medicare Other

## 2011-06-07 ENCOUNTER — Telehealth: Payer: Self-pay | Admitting: Critical Care Medicine

## 2011-06-07 MED ORDER — FLUCONAZOLE 100 MG PO TABS: ORAL_TABLET | ORAL | Status: DC

## 2011-06-07 NOTE — Telephone Encounter (Signed)
Patient notified of prescription and instructions for use. Pt requests this be sent to [facility].

## 2011-06-07 NOTE — Telephone Encounter (Signed)
Pt states she is still having a "raw" feeling in her throat and chest. She wonders if there is thrush in her throat or esophagus. She states TP looked at this when she saw her on 06/05/11 but did not see anything. She also says the soft tissue of her upper chest and shoulders feel like someone has beat her and she really thinks this was all caused by the Prednisone she had been taking. She wants recs from Dr. Delford Field. Pls advise.  Allergies  Allergen Reactions  . Dust Mite Extract   . Eggs Or Egg-Derived Products   . Hydrocodone-Acetaminophen     REACTION: unspecified  . Levofloxacin     REACTION: GI upset  . Milk-Related Compounds   . Molds & Smuts   . Morphine Sulfate     REACTION: pruritis  . Oxycodone Hcl   . Paroxetine     REACTION: unspecified  . Penicillins     REACTION: rash  . Rofecoxib     REACTION: feet swelling  . Shrimp Flavor   . Soy Allergy     Limited soy d/t cancer  . Strawberry   . Tetracycline Hcl     REACTION: unspecified  . Tree Extract

## 2011-06-07 NOTE — Telephone Encounter (Signed)
Call in empiric fluconazole 100mg  Take two once then one daily until gone  #6 Return TP if not better as I am out until May

## 2011-06-11 IMAGING — CT CT CHEST W/O CM
2 of 4 series · 15 of 36 positions shown, 18 images · non-contrast
Comparison: 10/21/2007 and other prior studies dating back to
06/05/2005

CLINICAL DATA: Breast carcinoma.  Follow up indeterminate pulmonary
nodules.

CT CHEST WITHOUT CONTRAST
TECHNIQUE: Multidetector CT imaging of the chest was performed
following the standard protocol without IV contrast.

[Series 2: chest w/o st · axial · non-contrast · 0.55mm/px · z∈[-281,-11]mm · 12 of 64 slices shown, 15 images]
[im 5/64  mediastinal]
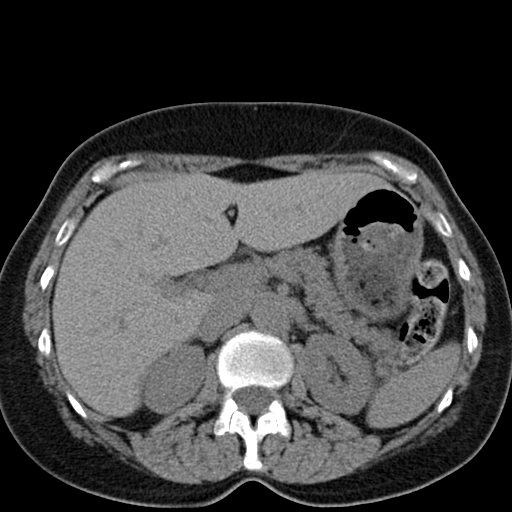
[im 5/64  lung]
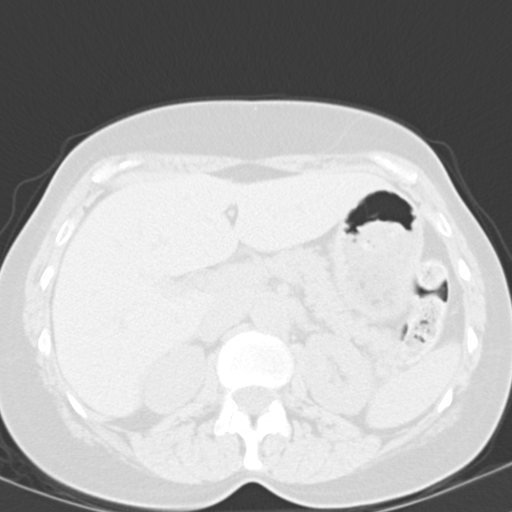
[im 10/64  lung]
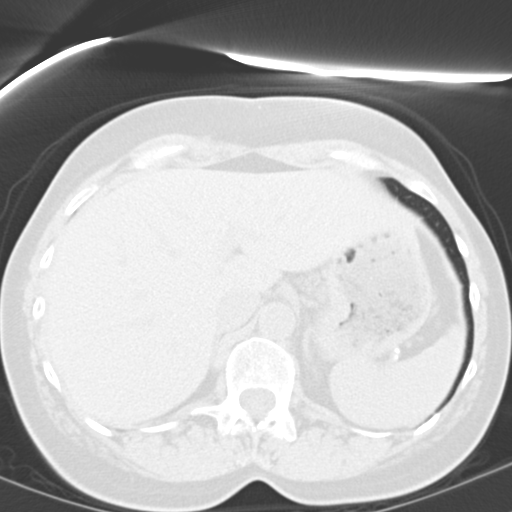
[im 15/64  lung]
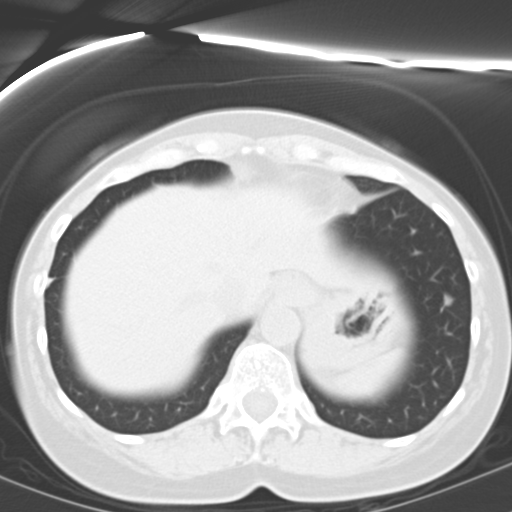
[im 20/64  lung]
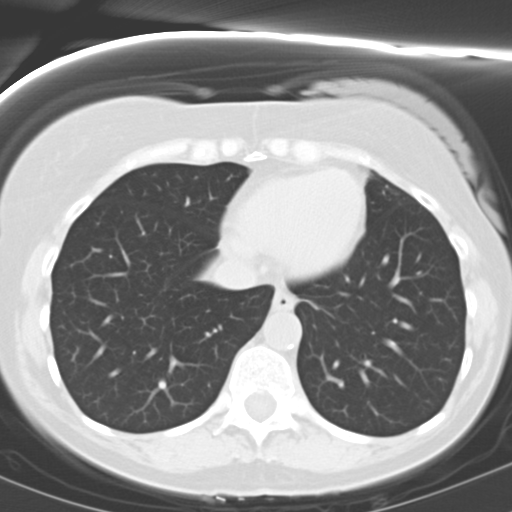
[im 25/64  mediastinal]
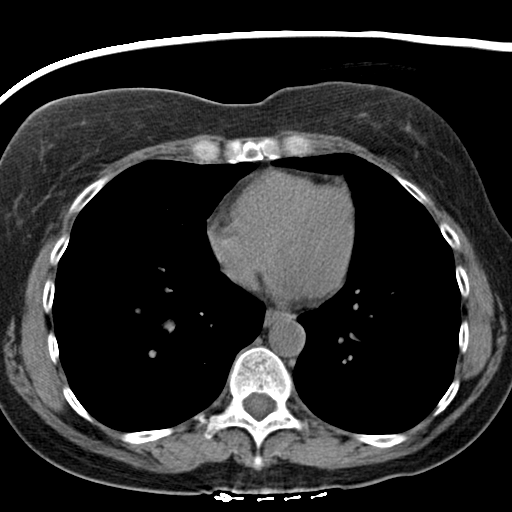
[im 25/64  lung]
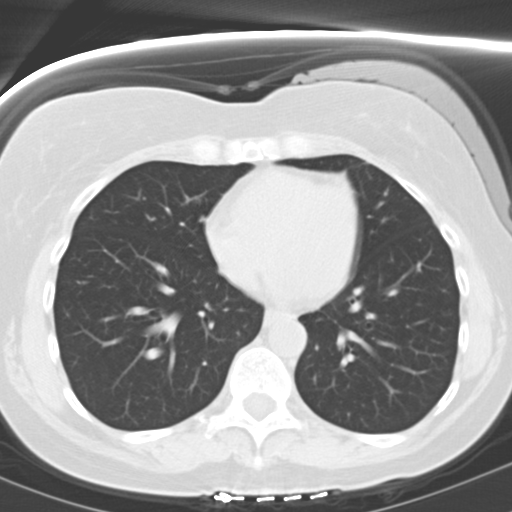
[im 30/64  lung]
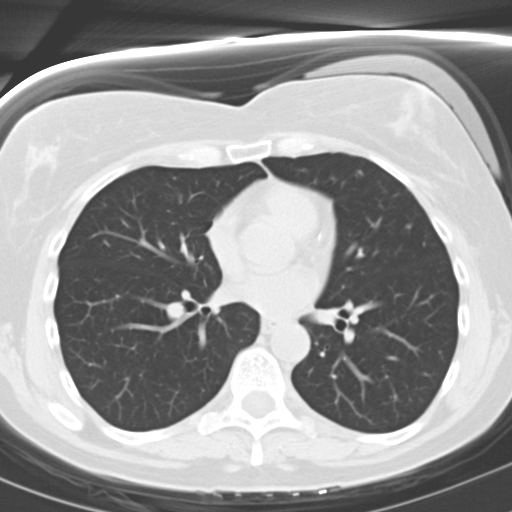
[im 34/64  lung]
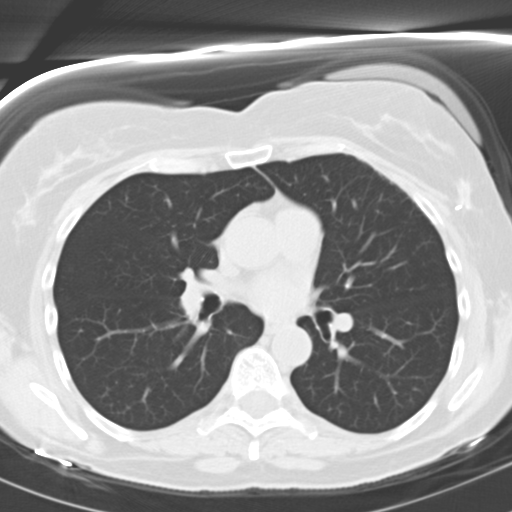
[im 39/64  lung]
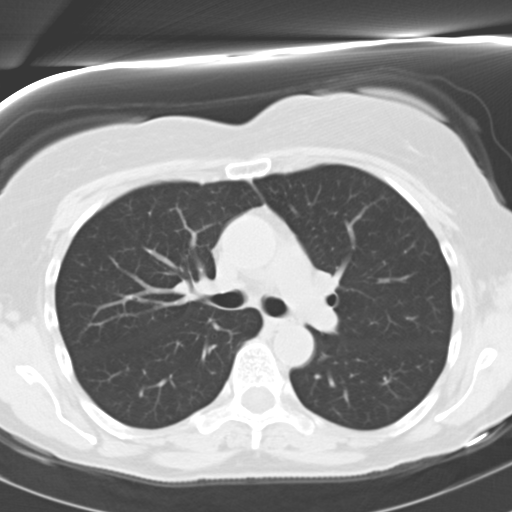
[im 44/64  mediastinal]
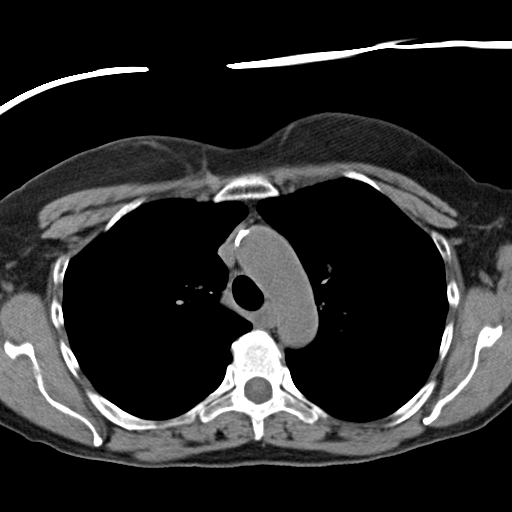
[im 44/64  lung]
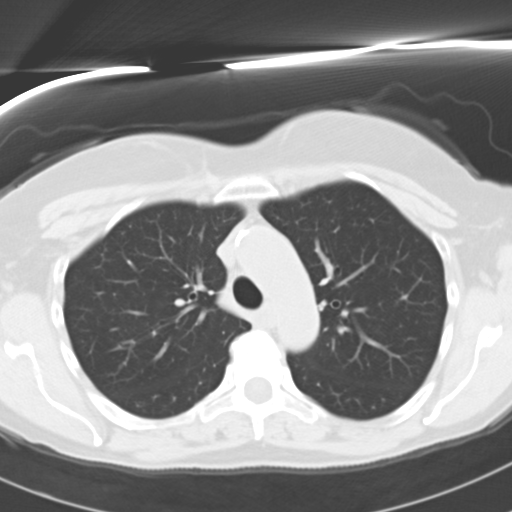
[im 49/64  lung]
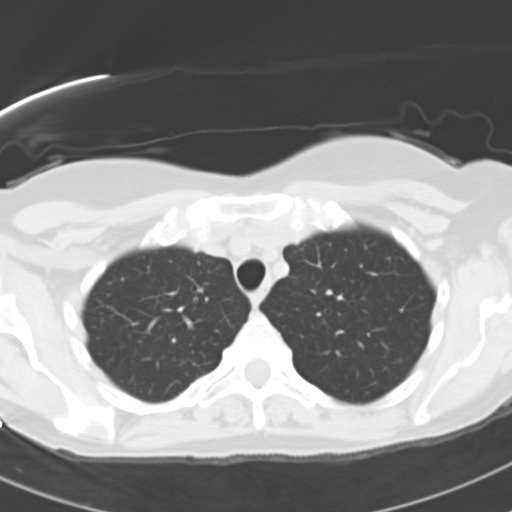
[im 54/64  lung]
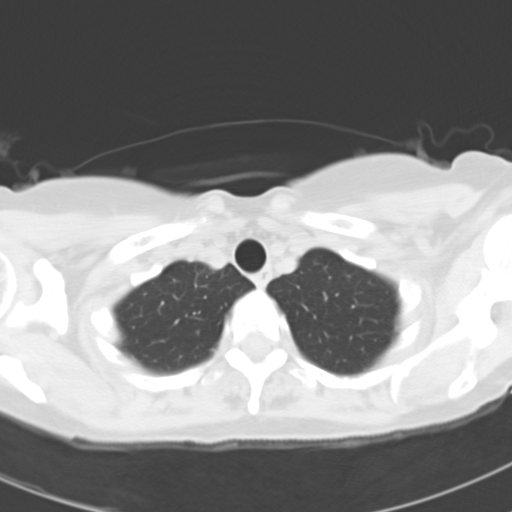
[im 59/64  lung]
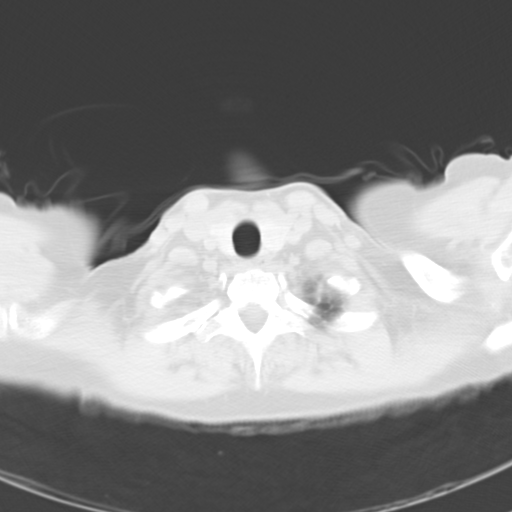

[Series 602: <mpr thick range> · coronal · 0.62mm/px · 3 of 69 slices shown]
[im 14/69  lung]
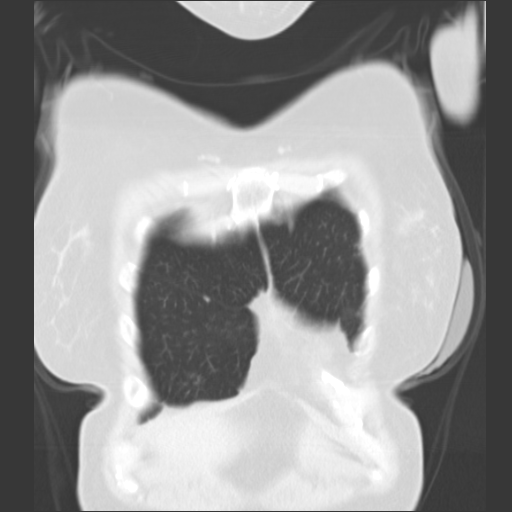
[im 28/69  lung]
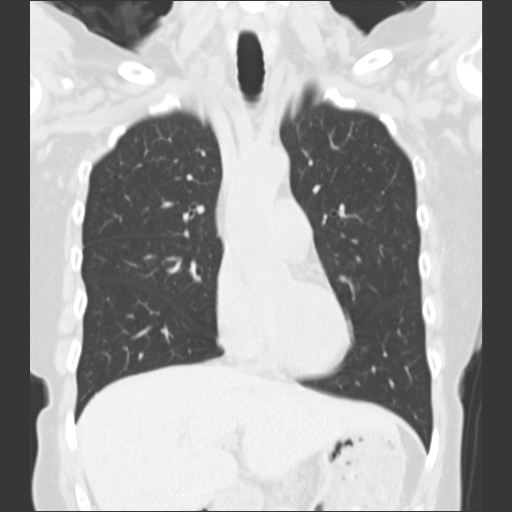
[im 41/69  lung]
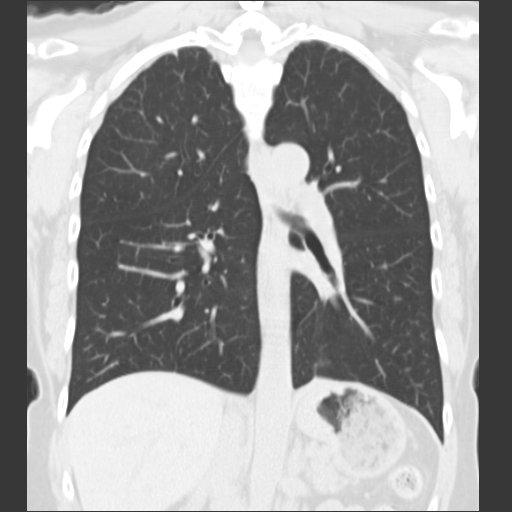

[15 of 36 positions shown; findings below may reference images not displayed]

FINDINGS: Small bilateral noncalcified pulmonary nodules are again
seen, largest in the left lower lobe measuring 6 mm.  These remain
stable since previous studies dating back to 3774, consistent with
a benign, postinflammatory etiology.  No new or enlarging pulmonary
nodules or masses are identified.  There is no evidence of acute
pulmonary infiltrate.

There is no evidence of pleural or pericardial effusion.  The no
hilar or mediastinal masses are identified on this noncontrast
study.  There is no evidence of pleural or pericardial effusion.
Visualized upper abdominal structures including adrenal glands are
normal in appearance.
IMPRESSION: Stable tiny benign pulmonary nodules.  No evidence of metastatic
disease or other acute findings.

## 2011-06-12 ENCOUNTER — Other Ambulatory Visit: Payer: Self-pay | Admitting: Critical Care Medicine

## 2011-06-13 ENCOUNTER — Ambulatory Visit (INDEPENDENT_AMBULATORY_CARE_PROVIDER_SITE_OTHER): Payer: Medicare Other

## 2011-06-13 DIAGNOSIS — J45909 Unspecified asthma, uncomplicated: Secondary | ICD-10-CM

## 2011-06-15 MED ORDER — OMALIZUMAB 150 MG ~~LOC~~ SOLR
150.0000 mg | Freq: Once | SUBCUTANEOUS | Status: AC
  Administered 2011-06-15: 150 mg via SUBCUTANEOUS

## 2011-06-21 ENCOUNTER — Telehealth: Payer: Self-pay | Admitting: Critical Care Medicine

## 2011-06-21 ENCOUNTER — Encounter: Payer: Self-pay | Admitting: Critical Care Medicine

## 2011-06-21 ENCOUNTER — Ambulatory Visit (INDEPENDENT_AMBULATORY_CARE_PROVIDER_SITE_OTHER): Payer: Medicare Other | Admitting: Critical Care Medicine

## 2011-06-21 VITALS — BP 128/72 | HR 86 | Temp 97.9°F | Ht 61.0 in | Wt 106.0 lb

## 2011-06-21 DIAGNOSIS — J45909 Unspecified asthma, uncomplicated: Secondary | ICD-10-CM

## 2011-06-21 DIAGNOSIS — J189 Pneumonia, unspecified organism: Secondary | ICD-10-CM

## 2011-06-21 NOTE — Patient Instructions (Addendum)
No more antibiotics When magic mouthwash runs out, begin Biotene mouth rinse daily Reduce Netipot use to three times weekly, once during those days Consider a job not in the hospital environment Stay on [place], [place], nasonex Return 2 months [place] office

## 2011-06-21 NOTE — Telephone Encounter (Signed)
Called, spoke with pt.  I apologized for any confusion on this earlier.  Advised, per PW, she does need to have labs.  Order has been placed.  She is aware to go to [place] office for this.

## 2011-06-21 NOTE — Telephone Encounter (Signed)
She needs IgG, IgA  IgM assay Please put in order and she can come to [facility] for this

## 2011-06-21 NOTE — Assessment & Plan Note (Signed)
Bilateral atypical bronchopneumonia now resolved No further antibiotics indicated

## 2011-06-21 NOTE — Telephone Encounter (Signed)
I spoke with pt and she states that PW had mentioned for her to have blood work but the nurse never mentioned it to her so she forgot until now. Dr. Delford Field was pt to have labs done, please advise thanks

## 2011-06-21 NOTE — Assessment & Plan Note (Addendum)
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

## 2011-06-21 NOTE — Progress Notes (Signed)
Subjective:    Patient ID: Ebony Scott, female    DOB: 05/16/1950, 61 y.o.   MRN: [id number]  HPI 61 y.o.. WF atopic moderate persistent asthma, elevated IgE. Started Xolair 05/03/10  Initial Pulmonary Consultation 12/23/09  She was mowing the lawn yesterday.  She wore a mask.  She developed a cough with yellow to green sputum today.  She also had sinus fullness over her left eye.  She denied fever, wheeze, hemoptysis, chest pain, abdominal pain, rash, or gland swelling.  She has been using dulera one puff bid but not nasal irrigation.  01/31/11 Pt last seen 8/12 by VS for flare . Now is coughing more, notes more sinus drainage.  Has drops on L eye and coughs every evening. Mucus now is gray to yellow.  Changed color two weeks ago. Notes pndrip and ear pain with sinus pressure.  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   PFR are better   05/04/11  Worse over the past few weeks.  Notes more chest tightness, more dyspnea daily. Had to stop the astelin at night.  Notes facial pressure. Notes some headaches. Notes pn drip and cough up yellow.  Notes more dyspnea up and down steps PFR 280-300   >>Rx zpack   05/29/11  Acute OV  Complains of sob, sinus pressure, nose bleeds, and cough with yellow/green mucus mostly in the morning. Also pain under right breast and right side of her back that started x 1 week ago.  OTC not helping. Finished zpack with some help but never cleared completely.  Had CT sinus in 12/2009 which was nml.  No hemoptysis , chest pain or hemoptysis.  >>CXR done , abn >CT chest w/ multilobar PNA >Rx Avelox   06/05/2011 Acute OV  Returns for persistent symptoms with cough , congestion . The patient was seen last week for asthmatic exacerbation. Despite having multiple antibiotic therapies. Chest x-ray showed a left-sided consolidation. A subsequent CT of the chest was done and showed multi lobar infiltrates, consistent with pneumonia. Patient was treated with a  ten-day course of Avelox along with a prednisone taper. A. She returns today reporting that she is feeling some better however, continues to feel very weak, with achiness along her shoulders, neck. She still has not regained her energy and feels like her taste buds are not back to normal. She denies any hemoptysis, chest pain, orthopnea, PND, or leg swelling. Patient has a good appetite, with no nausea, vomiting. Chest x-ray today, shows complete clearance of the, pulmonary infiltrates.   06/21/2011 Pt was dx bilateral pna in 3/13 and  4/13 and saw NP several occasions.  Rx: Zpak, then avelox plus pred pulse Last CXR 4/16 : clear Then added diflucan and this helped the mouth. Now still with dyspnea.  Pt will stay inside.  Occ cough,  No more yellow green mucus. Did not sl specks that are yellow.  PFM: 300 to 280.    No real chest pain.  IF breathe in feels sl tightness and pain.  Pt had pleurisy with pna.      Past Medical History  Diagnosis Date  . Chronic neck pain   . Allergic rhinitis   . Moderate persistent asthma     -FeV1 72% 2011, -IgE 102 2011, CT sinus Neg 2011  . Breast cancer     in remission  . GERD (gastroesophageal reflux disease)   . Hypothyroidism   . Osteoporosis     on reclast  yearly  . Hiatal hernia   . Diverticulosis   . Hemorrhoids   . Asthma   . IBS (irritable bowel syndrome)      Family History  Problem Relation Age of Onset  . Allergies Mother   . Allergies Father   . Heart disease Mother   . Heart disease Father   . Arthritis Mother   . Arthritis Father   . Lung cancer Mother   . Colon cancer      Maternal half uncle  . Colon cancer      Maternal half aunt  . Colitis Daughter     Review of SystemsConstitutional:   No  weight loss, night sweats,  Fevers, chills,  +fatigue, or  lassitude.  HEENT:   No headaches,  Difficulty swallowing,  Tooth/dental problems, or  Sore throat,                No sneezing, itching, ear ache,  +nasal congestion,  post nasal drip,   CV:  No chest pain,  Orthopnea, PND, swelling in lower extremities, anasarca, dizziness, palpitations, syncope.   GI  No heartburn, indigestion, abdominal pain, nausea, vomiting, diarrhea, change in bowel habits, loss of appetite, bloody stools.   Resp:  ,  No coughing up of blood.   No chest wall deformity  Skin: no rash or lesions.  GU: no dysuria, change in color of urine, no urgency or frequency.  No flank pain, no hematuria   MS:  No joint pain or swelling.  No decreased range of motion.  No back pain.  Psych:  No change in mood or affect. No depression or anxiety.  No memory loss.    '     Objective:   Physical Exam   BP 128/72  Pulse 86  Temp(Src) 97.9 F (36.6 C) (Oral)  Ht 5\' 1"  (1.549 m)  Wt 106 lb (48.081 kg)  BMI 20.03 kg/m2  SpO2 97%  HEENT - pale nasal mucosa, no sinus tenderness, TM clear, no oral exudate, no LAN Chest - coarse BS w/ no wheezing.  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 acetaminophen (TYLENOL ARTHRITIS PAIN) 650 MG CR tablet Take 1,300 mg by mouth 2 (two) times daily.        . Aloe-Sodium Chloride (AYR SALINE NASAL GEL NA) by Nasal route. Per box        . Alpha-D-Galactosidase (BEANO) TABS Take by mouth daily.       . Alpha-Lipoic Acid 600 MG CAPS Take by mouth. Once a day       . Alum & Mag Hydroxide-Simeth (MAGIC MOUTHWASH) SOLN Take 5-10 mLs by mouth 3 (three) times daily as needed.  240 mL  3  . Artificial Tear  Ointment (REFRESH P.M. OP) Apply to eye. Apply small amount under eye lids at bedtime       . Azelastine HCl 0.15 % SOLN Place 1 spray into the nose daily.      . Calcium Citrate-Vitamin D (CITRACAL MAXIMUM) 315-250 MG-UNIT TABS Take  by mouth. 1 capsule twice a day       . Cholecalciferol (VITAMIN D) 2000 UNITS CAPS Take by mouth. 1/2 at bedtime       . EPINEPHrine (EPIPEN 2-PAK) 0.3 mg/0.3 mL DEVI As directed       . ESTRACE VAGINAL 0.1 MG/GM vaginal cream once a week      . fexofenadine (ALLEGRA) 180 MG tablet Take 180 mg by mouth daily.       Marland Kitchen gabapentin (NEURONTIN) 600 MG tablet Once a day       . hydrocortisone (PROCTOZONE-HC) 2.5 % rectal cream As needed       . lactase (LACTAID) 3000 UNITS tablet 3-4 tabs with dairy liquids and solids       . lidocaine (LIDODERM) 5 % Place 1 patch onto the skin daily as needed. Remove & Discard patch within 12 hours or as directed by MD      . Melatonin 3 MG CAPS Take 9 mg by mouth. 3 tabs at bedtime      . mometasone-formoterol (DULERA) 100-5 MCG/ACT AERO Inhale 1 puff into the lungs 2 (two) times daily.      Marland Kitchen NASONEX 50 MCG/ACT nasal spray USE 2 SPRAYS IN EACH NOSTRIL DAILY  17 g  2  . neomycin-bacitracin-polymyxin (TRIPLE ANTIBIOTIC) ophthalmic ointment Apply as needed       . nystatin-triamcinolone (MYCOLOG II) cream Apply as needed       . omalizumab (XOLAIR) 150 MG injection 150 SQ every 4 weeks      . Pancrelipase, Lip-Prot-Amyl, (CREON) 24000 UNITS CPEP Take by mouth. 1 with each meal       . Polyethyl Glycol-Propyl Glycol (SYSTANE ULTRA) 0.4-0.3 % SOLN Apply to eye. 1-2 drops each eye every morning and at bedtime       . Prenatal Vit-DSS-Fe Cbn-FA (VINATE ULTRA) TABS Take by mouth. Once a day       . Probiotic Product (ALIGN) 4 MG CAPS Take by mouth. Once a day       . propylthiouracil (PTU) 50 MG tablet 1 tablet twice a day       . Pseudoephedrine HCl (SUDAFED PO) Take by mouth daily.      . pseudoephedrine-guaifenesin (MUCINEX D) 60-600 MG  per tablet Take 1 tablet by mouth 2 (two) times daily.       . rosuvastatin (CRESTOR) 20 MG tablet Take 10 mg by mouth daily.        . Simethicone (GAS-X EXTRA STRENGTH) 125 MG CAPS Take by mouth. Per box       . zoledronic acid (RECLAST) 5 MG/100ML SOLN Once a year       . DISCONTD: Diphenhyd-Hydrocort-Nystatin (FIRST-DUKES MOUTHWASH) SUSP Swish, gargle and swallow every 6 hours as needed.  240 mL  1  . DISCONTD: fluconazole (DIFLUCAN) 100 MG tablet Take 2 tablets on day 1 then 1 tablet daily until gone  6 tablet  0  . DISCONTD: predniSONE (DELTASONE) 10 MG tablet 4 tabs for 2 days, then 3 tabs for 2 days, 2 tabs for 2 days, then 1 tab for 2 days, then stop  20 tablet  0

## 2011-06-28 ENCOUNTER — Other Ambulatory Visit: Payer: Medicare Other

## 2011-06-28 DIAGNOSIS — J45909 Unspecified asthma, uncomplicated: Secondary | ICD-10-CM

## 2011-06-29 LAB — IGG, IGA, IGM
IgA: 134 mg/dL (ref 69–380)
IgG (Immunoglobin G), Serum: 623 mg/dL — ABNORMAL LOW (ref 690–1700)
IgM, Serum: 125 mg/dL (ref 52–322)

## 2011-06-29 NOTE — Progress Notes (Signed)
Quick Note:  Lab results printed and faxed to Dr. Caren Macadam office at [phone]. ______

## 2011-06-29 NOTE — Progress Notes (Signed)
Quick Note:  Called, spoke with pt. I informed her of lab results and recs per Dr. Delford Field. She verbalized understanding of this and requesting these results to be faxed to Dr. Tanya Nones, PCP. ______

## 2011-06-29 NOTE — Progress Notes (Signed)
Quick Note:  Call pt and tell her labs are ok, No change in medications Her immunoglobulins are normal ______

## 2011-07-11 ENCOUNTER — Ambulatory Visit (INDEPENDENT_AMBULATORY_CARE_PROVIDER_SITE_OTHER): Payer: Medicare Other

## 2011-07-11 ENCOUNTER — Other Ambulatory Visit: Payer: Self-pay | Admitting: Critical Care Medicine

## 2011-07-11 DIAGNOSIS — J45909 Unspecified asthma, uncomplicated: Secondary | ICD-10-CM

## 2011-07-11 MED ORDER — OMALIZUMAB 150 MG ~~LOC~~ SOLR
150.0000 mg | Freq: Once | SUBCUTANEOUS | Status: AC
  Administered 2011-07-11: 150 mg via SUBCUTANEOUS

## 2011-08-07 ENCOUNTER — Telehealth: Payer: Self-pay | Admitting: Critical Care Medicine

## 2011-08-07 NOTE — Telephone Encounter (Signed)
Called spoke with patient who stated that she faxed a request to our office (at [phone]) on 6.11.13 requesting a letter be composed regarding her past pulmonary issues and the resolution/improvement of her symptoms for long-term care insurance.  Informed pt that PEW is not in the office today but will return tomorrow afternoon.  Pt asked if she could fax another copy - additional copy received via the triage fax.    Placed in PEW's lookat and message forwarded to PEW and Crystal.

## 2011-08-08 ENCOUNTER — Ambulatory Visit (INDEPENDENT_AMBULATORY_CARE_PROVIDER_SITE_OTHER): Payer: Medicare Other

## 2011-08-08 DIAGNOSIS — J45909 Unspecified asthma, uncomplicated: Secondary | ICD-10-CM

## 2011-08-08 NOTE — Telephone Encounter (Signed)
i responded to this and it is in crystal folder

## 2011-08-09 ENCOUNTER — Encounter: Payer: Self-pay | Admitting: *Deleted

## 2011-08-09 MED ORDER — OMALIZUMAB 150 MG ~~LOC~~ SOLR
150.0000 mg | Freq: Once | SUBCUTANEOUS | Status: AC
  Administered 2011-08-09: 150 mg via SUBCUTANEOUS

## 2011-08-09 NOTE — Telephone Encounter (Signed)
Letter has been completed and placed in mail.  Pt is aware and I have verified her home address.  Nothing further needed at this time.

## 2011-08-14 ENCOUNTER — Telehealth: Payer: Self-pay | Admitting: Critical Care Medicine

## 2011-08-14 NOTE — Telephone Encounter (Signed)
Pt states never received the letter we mailed her on 08-09-11 so I verified address, mailed another copy and faxed to her at [phone] per her request.    Spoke with pt. She states that she had xolair inj on 08/08/11 and since then has had severe pain- starts in the nape of her neck and radiates down her spine. She states that she is also having pain in her hips the past several days and also relates this to inj. She has been on xolair for approx 1 yr, and states that she has always noticed some joint pain, but not to this extent and not in her back. Please advise CDY, thanks!

## 2011-08-14 NOTE — Telephone Encounter (Signed)
Spoke with pt and notified of recs per CDY. She verbalized understanding and states no questions. She will keep planned rov with PW for 08-22-11 and call sooner if needed. Will forward to PW as FYI.

## 2011-08-14 NOTE — Telephone Encounter (Signed)
Pain like this sounds more like a pinched nerve, rather than a generalized arthritis. I doubt it is from her xolair.  However- recommend no more Xolair until she sees Dr Delford Field- scheduled in July. Meanwhile suggest heating pad, ibuprofen.

## 2011-08-22 ENCOUNTER — Ambulatory Visit (INDEPENDENT_AMBULATORY_CARE_PROVIDER_SITE_OTHER): Payer: Medicare Other | Admitting: Critical Care Medicine

## 2011-08-22 ENCOUNTER — Encounter: Payer: Self-pay | Admitting: Critical Care Medicine

## 2011-08-22 VITALS — BP 120/88 | HR 89 | Temp 98.0°F | Ht 61.0 in | Wt 107.8 lb

## 2011-08-22 DIAGNOSIS — J45909 Unspecified asthma, uncomplicated: Secondary | ICD-10-CM

## 2011-08-22 NOTE — Progress Notes (Signed)
Subjective:    Patient ID: Ebony Scott, female    DOB: 19-Jan-1951, 61 y.o.   MRN: [id number]  HPI  61 y.o.. WF atopic moderate persistent asthma, elevated IgE. Started Xolair 05/03/10  Initial Pulmonary Consultation 12/23/09   08/22/2011 Pt had a letter given for LTC insurance.  Memory is better.  Since last OV PFR 280-300.  Pt felt better then last few days is coughing more.  Mold issues. Yellow mucus, cough is more.  Dyspnea is harder for few days with all the rain.   Past Medical History  Diagnosis Date  . Chronic neck pain   . Allergic rhinitis   . Moderate persistent asthma     -FeV1 72% 2011, -IgE 102 2011, CT sinus Neg 2011  . Breast cancer     in remission  . GERD (gastroesophageal reflux disease)   . Hypothyroidism   . Osteoporosis     on reclast yearly  . Hiatal hernia   . Diverticulosis   . Hemorrhoids   . Asthma   . IBS (irritable bowel syndrome)      Family History  Problem Relation Age of Onset  . Allergies Mother   . Allergies Father   . Heart disease Mother   . Heart disease Father   . Arthritis Mother   . Arthritis Father   . Lung cancer Mother   . Colon cancer      Maternal half uncle  . Colon cancer      Maternal half aunt  . Colitis Daughter     Review of Systems Constitutional:   No  weight loss, night sweats,  Fevers, chills,  +fatigue, or  lassitude.  HEENT:   No headaches,  Difficulty swallowing,  Tooth/dental problems, or  Sore throat,                No sneezing, itching, ear ache,  +nasal congestion, post nasal drip,   CV:  No chest pain,  Orthopnea, PND, swelling in lower extremities, anasarca, dizziness, palpitations, syncope.   GI  No heartburn, indigestion, abdominal pain, nausea, vomiting, diarrhea, change in bowel habits, loss of appetite, bloody stools.   Resp:  ,  No coughing up of blood.   No chest wall deformity  Skin: no rash or lesions.  GU: no dysuria, change in color of urine, no urgency or frequency.  No flank  pain, no hematuria   MS:  No joint pain or swelling.  No decreased range of motion.  No back pain.  Psych:  No change in mood or affect. No depression or anxiety.  No memory loss.    '     Objective:   Physical Exam   BP 120/88  Pulse 89  Temp 98 F (36.7 C) (Oral)  Ht 5\' 1"  (1.549 m)  Wt 107 lb 12.8 oz (48.898 kg)  BMI 20.37 kg/m2  SpO2 90%  HEENT - pale nasal mucosa, no sinus tenderness, TM clear, no oral exudate, no LAN Chest - coarse BS w/ no wheezing.  Cardiac - s1 s2 regular Abd - soft, nontender Ext - no edema Neuro - normal strength,  Skin - no rash Psych - normal mood/behavior     Assessment & Plan:   Asthma with allergic rhinitis Severe persistent asthma with significant atopic features positive response to Xolair therapy Plan Maintain inhaled medications as prescribed Maintain Xolair therapy Note the patient questioned whether she had thrush in the oral cavity but she does not have thrush at this time  and does not require therapy at this time    Updated Medication List Outpatient Encounter Prescriptions as of 08/22/2011  Medication Sig Dispense Refill  . acetaminophen (TYLENOL ARTHRITIS PAIN) 650 MG CR tablet Take 1,300 mg by mouth 2 (two) times daily.        . Aloe-Sodium Chloride (AYR SALINE NASAL GEL NA) by Nasal route. Per box        . Alpha-D-Galactosidase (BEANO) TABS Take by mouth daily.       . Alpha-Lipoic Acid 600 MG CAPS Take by mouth. Once a day       . Alum & Mag Hydroxide-Simeth (MAGIC MOUTHWASH) SOLN Take 5-10 mLs by mouth 3 (three) times daily as needed.  240 mL  3  . Artificial Tear Ointment (REFRESH P.M. OP) Apply to eye as needed. Apply small amount under eye lids at bedtime      . Azelastine HCl 0.15 % SOLN Place 1 spray into the nose daily.      . Calcium Citrate-Vitamin D (CITRACAL MAXIMUM) 315-250 MG-UNIT TABS Take by mouth. 1 capsule twice a day       . Cholecalciferol (VITAMIN D) 2000 UNITS CAPS Take by mouth. 1/2 at bedtime        . EPIPEN 2-PAK 0.3 MG/0.3ML DEVI USE AS DIRECTED  2 each  2  . ESTRACE VAGINAL 0.1 MG/GM vaginal cream once a week      . gabapentin (NEURONTIN) 600 MG tablet Once a day       . hydrocortisone (PROCTOZONE-HC) 2.5 % rectal cream As needed       . lactase (LACTAID) 3000 UNITS tablet 3-4 tabs with dairy liquids and solids       . levocetirizine (XYZAL) 5 MG tablet Take 5 mg by mouth daily.      Marland Kitchen lidocaine (LIDODERM) 5 % Place 1 patch onto the skin daily as needed. Remove & Discard patch within 12 hours or as directed by MD      . Melatonin 3 MG CAPS Take 9 mg by mouth. 3 tabs at bedtime      . mometasone-formoterol (DULERA) 100-5 MCG/ACT AERO Inhale 1 puff into the lungs 2 (two) times daily.      . montelukast (SINGULAIR) 10 MG tablet Daily.      Marland Kitchen NASONEX 50 MCG/ACT nasal spray USE 2 SPRAYS IN EACH NOSTRIL DAILY  17 g  2  . neomycin-bacitracin-polymyxin (TRIPLE ANTIBIOTIC) ophthalmic ointment Apply as needed       . nystatin-triamcinolone (MYCOLOG II) cream Apply as needed       . omalizumab (XOLAIR) 150 MG injection 150 SQ every 4 weeks      . Pancrelipase, Lip-Prot-Amyl, (CREON) 24000 UNITS CPEP Take by mouth. 1 with each meal       . Polyethyl Glycol-Propyl Glycol (SYSTANE ULTRA) 0.4-0.3 % SOLN Apply to eye. 1-2 drops each eye every morning and at bedtime       . Prenatal Vit-DSS-Fe Cbn-FA (VINATE ULTRA) TABS Take by mouth. Once a day       . Probiotic Product (ALIGN) 4 MG CAPS Take by mouth. Once a day       . propylthiouracil (PTU) 50 MG tablet 1 tablet twice a day       . Pseudoephedrine HCl (SUDAFED PO) Take by mouth as needed.       . pseudoephedrine-guaifenesin (MUCINEX D) 60-600 MG per tablet       . Simethicone (GAS-X EXTRA STRENGTH) 125 MG CAPS Take by mouth.  Per box       . zoledronic acid (RECLAST) 5 MG/100ML SOLN Once a year       . DISCONTD: MUCINEX D 60-600 MG per tablet TAKE 1 TABLET BY MOUTH TWICE DAILY AS   NEEDED  60 each  2  . DISCONTD: fexofenadine (ALLEGRA) 180 MG  tablet Take 180 mg by mouth daily.       Marland Kitchen DISCONTD: pseudoephedrine-guaifenesin (MUCINEX D) 60-600 MG per tablet Take 1 tablet by mouth 2 (two) times daily.       Marland Kitchen DISCONTD: rosuvastatin (CRESTOR) 20 MG tablet Take 10 mg by mouth daily. ON HOLD

## 2011-08-22 NOTE — Patient Instructions (Addendum)
No change in medications. Return in   3 months 

## 2011-08-22 NOTE — Assessment & Plan Note (Signed)
Severe persistent asthma with significant atopic features positive response to Xolair therapy Plan Maintain inhaled medications as prescribed Maintain Xolair therapy Note the patient questioned whether she had thrush in the oral cavity but she does not have thrush at this time and does not require therapy at this time

## 2011-08-24 ENCOUNTER — Telehealth: Payer: Self-pay | Admitting: Critical Care Medicine

## 2011-08-24 NOTE — Telephone Encounter (Signed)
I spoke with pt and she states she has been having a lot of joint pain, hand and wrist pain x yesterday. Pt states she could hardly walk. She is taking 3-4 tablets of aleve daily. I advised pt she needed to call her PCP for recs. She states she will call them. Nothing further was neede

## 2011-08-27 ENCOUNTER — Other Ambulatory Visit: Payer: Self-pay | Admitting: Family Medicine

## 2011-08-27 ENCOUNTER — Ambulatory Visit
Admission: RE | Admit: 2011-08-27 | Discharge: 2011-08-27 | Disposition: A | Discharge status: Home / Self Care | Payer: Medicare Other | Source: Ambulatory Visit | Attending: Family Medicine | Admitting: Family Medicine

## 2011-08-27 DIAGNOSIS — R52 Pain, unspecified: Secondary | ICD-10-CM

## 2011-09-05 ENCOUNTER — Encounter: Payer: Self-pay | Admitting: *Deleted

## 2011-09-05 ENCOUNTER — Telehealth: Payer: Self-pay | Admitting: *Deleted

## 2011-09-05 NOTE — Telephone Encounter (Signed)
Received fax from pt requesting a letter for an stating she can join an exercise facility.  I spoke with pt -- she wanted this letter to mention that she may not exercise at times throughout the year when the allergens are high.  I have redone the letter to include this and mailed to pt's home address which I did verify.  She is aware of this.  Note:  Pt also requested a letter stating it is necessary for her to pay for smoking cessation supplies for her son to quit smoking.  Per Dr. Delford Field, we cannot do this letter. Pt is aware.

## 2011-09-11 ENCOUNTER — Ambulatory Visit (INDEPENDENT_AMBULATORY_CARE_PROVIDER_SITE_OTHER): Payer: Medicare Other

## 2011-09-11 DIAGNOSIS — J45909 Unspecified asthma, uncomplicated: Secondary | ICD-10-CM

## 2011-09-11 MED ORDER — OMALIZUMAB 150 MG ~~LOC~~ SOLR
150.0000 mg | Freq: Once | SUBCUTANEOUS | Status: AC
  Administered 2011-09-11: 150 mg via SUBCUTANEOUS

## 2011-09-19 ENCOUNTER — Ambulatory Visit
Admission: RE | Admit: 2011-09-19 | Discharge: 2011-09-19 | Disposition: A | Discharge status: Home / Self Care | Payer: Medicare Other | Source: Ambulatory Visit | Attending: Obstetrics and Gynecology | Admitting: Obstetrics and Gynecology

## 2011-09-19 DIAGNOSIS — Z1231 Encounter for screening mammogram for malignant neoplasm of breast: Secondary | ICD-10-CM

## 2011-09-19 LAB — HM MAMMOGRAPHY

## 2011-10-03 ENCOUNTER — Other Ambulatory Visit: Payer: Self-pay | Admitting: Critical Care Medicine

## 2011-10-08 ENCOUNTER — Ambulatory Visit: Payer: Medicare Other

## 2011-10-09 ENCOUNTER — Ambulatory Visit (INDEPENDENT_AMBULATORY_CARE_PROVIDER_SITE_OTHER): Payer: Medicare Other

## 2011-10-09 DIAGNOSIS — J45909 Unspecified asthma, uncomplicated: Secondary | ICD-10-CM

## 2011-10-09 MED ORDER — OMALIZUMAB 150 MG ~~LOC~~ SOLR
150.0000 mg | Freq: Once | SUBCUTANEOUS | Status: AC
  Administered 2011-10-09: 150 mg via SUBCUTANEOUS

## 2011-11-06 ENCOUNTER — Encounter: Payer: Self-pay | Admitting: Critical Care Medicine

## 2011-11-07 ENCOUNTER — Ambulatory Visit (INDEPENDENT_AMBULATORY_CARE_PROVIDER_SITE_OTHER): Payer: Medicare Other

## 2011-11-07 DIAGNOSIS — J45909 Unspecified asthma, uncomplicated: Secondary | ICD-10-CM

## 2011-11-07 MED ORDER — OMALIZUMAB 150 MG ~~LOC~~ SOLR
150.0000 mg | Freq: Once | SUBCUTANEOUS | Status: AC
  Administered 2011-11-07: 150 mg via SUBCUTANEOUS

## 2011-11-20 DIAGNOSIS — M797 Fibromyalgia: Secondary | ICD-10-CM

## 2011-11-20 HISTORY — DX: Fibromyalgia: M79.7

## 2011-11-23 ENCOUNTER — Telehealth: Payer: Self-pay | Admitting: Critical Care Medicine

## 2011-11-23 ENCOUNTER — Encounter: Payer: Self-pay | Admitting: *Deleted

## 2011-11-23 NOTE — Telephone Encounter (Signed)
letter completed.  Spoke with pt.  She would like it mailed to her home address.  I have verified this and placed letter in mail -- pt aware.

## 2011-11-23 NOTE — Telephone Encounter (Signed)
Pt states she had the mold testing done in her home and a lot of mold was found in her ducts. She states the company is highly recommending she have an Museum/gallery curator team come out to due further testing. She states they will get samples if needed to see what kind of mold she has. Pt is requesting a letter stating the Air quality testing is medically necessary and that all recommendations from the Air Quality Team should be followed through with as well. Pt has an upcoming appt on 11-28-11 with PW and will bring report with her at that time. Please advise if ok to do letter. Carron Curie, CMA

## 2011-11-23 NOTE — Telephone Encounter (Signed)
This is ok, crystal, can you compose such letter?

## 2011-11-28 ENCOUNTER — Ambulatory Visit (INDEPENDENT_AMBULATORY_CARE_PROVIDER_SITE_OTHER): Payer: Medicare Other | Admitting: Critical Care Medicine

## 2011-11-28 ENCOUNTER — Telehealth: Payer: Self-pay | Admitting: Critical Care Medicine

## 2011-11-28 ENCOUNTER — Ambulatory Visit (INDEPENDENT_AMBULATORY_CARE_PROVIDER_SITE_OTHER)
Admission: RE | Admit: 2011-11-28 | Discharge: 2011-11-28 | Disposition: A | Discharge status: Home / Self Care | Payer: Medicare Other | Source: Ambulatory Visit | Attending: Critical Care Medicine | Admitting: Critical Care Medicine

## 2011-11-28 ENCOUNTER — Encounter: Payer: Self-pay | Admitting: Critical Care Medicine

## 2011-11-28 VITALS — BP 130/80 | HR 69 | Temp 98.4°F | Ht 61.25 in | Wt 107.8 lb

## 2011-11-28 DIAGNOSIS — J189 Pneumonia, unspecified organism: Secondary | ICD-10-CM | POA: Insufficient documentation

## 2011-11-28 DIAGNOSIS — J209 Acute bronchitis, unspecified: Secondary | ICD-10-CM

## 2011-11-28 DIAGNOSIS — Z23 Encounter for immunization: Secondary | ICD-10-CM

## 2011-11-28 MED ORDER — MOXIFLOXACIN HCL 400 MG PO TABS: 400.0000 mg | ORAL_TABLET | Freq: Every day | ORAL | Status: DC

## 2011-11-28 MED ORDER — GUAIFENESIN ER 600 MG PO TB12: 1200.0000 mg | ORAL_TABLET | Freq: Two times a day (BID) | ORAL | Status: DC

## 2011-11-28 NOTE — Assessment & Plan Note (Addendum)
Recurrent RUL pneumonia with asthma flare ?atopic or mold component?  Plan Avelox daily for 5days No other changes in medications No prednisone Pneumovax

## 2011-11-28 NOTE — Telephone Encounter (Signed)
cxr showed early pneumonia RUL.  Stay on meds as Rx this OV

## 2011-11-28 NOTE — Progress Notes (Signed)
Subjective:    Patient ID: Ebony Scott, female    DOB: [birth year], 61 y.o.   MRN: [id number]  HPI  61 y.o.. WF atopic moderate persistent asthma, elevated IgE. Started Xolair 05/03/10  Initial Pulmonary Consultation 12/23/09   08/22/2011 Pt had a letter given for LTC insurance.  Memory is better.  Since last OV PFR 280-300.  Pt felt better then last few days is coughing more.  Mold issues. Yellow mucus, cough is more.  Dyspnea is harder for few days with all the rain.    11/28/2011 Dark yellow mucus, more cough before furnace cleaned.  HVAC cleaned one week ago.   More dysppnea and worse.  PFR:  260-300 , generally lower in past few weeks. Mold spore levels high now.   No real nasal congestion, chest feels tight and mucus is from lungs. Mucus out of nose is clear.    Past Medical History  Diagnosis Date  . Chronic neck pain   . Allergic rhinitis   . Moderate persistent asthma     -FeV1 72% 2011, -IgE 102 2011, CT sinus Neg 2011  . Breast cancer     in remission  . GERD (gastroesophageal reflux disease)   . Hypothyroidism   . Osteoporosis     on reclast yearly  . Hiatal hernia   . Diverticulosis   . Hemorrhoids   . Asthma   . IBS (irritable bowel syndrome)      Family History  Problem Relation Age of Onset  . Allergies Mother   . Allergies Father   . Heart disease Mother   . Heart disease Father   . Arthritis Mother   . Arthritis Father   . Lung cancer Mother   . Colon cancer      Maternal half uncle  . Colon cancer      Maternal half aunt  . Colitis Daughter     Review of Systems Constitutional:   No  weight loss, night sweats,  Fevers, chills,  +fatigue, or  lassitude.  HEENT:   No headaches,  Difficulty swallowing,  Tooth/dental problems, or  Sore throat,                No sneezing, itching, ear ache,  +nasal congestion, post nasal drip,   CV:  No chest pain,  Orthopnea, PND, swelling in lower extremities, anasarca, dizziness, palpitations, syncope.    GI  No heartburn, indigestion, abdominal pain, nausea, vomiting, diarrhea, change in bowel habits, loss of appetite, bloody stools.   Resp:  ,  No coughing up of blood.   No chest wall deformity  Skin: no rash or lesions.  GU: no dysuria, change in color of urine, no urgency or frequency.  No flank pain, no hematuria   MS:  No joint pain or swelling.  No decreased range of motion.  No back pain.  Psych:  No change in mood or affect. No depression or anxiety.  No memory loss.    '     Objective:   Physical Exam   BP 130/80  Pulse 69  Temp 98.4 F (36.9 C) (Oral)  Ht 5' 1.25" (1.556 m)  Wt 107 lb 12.8 oz (48.898 kg)  BMI 20.20 kg/m2  SpO2 100%  HEENT - pale nasal mucosa, no sinus tenderness, TM clear, no oral exudate, no LAN Chest - wheeze RUL and scattered rales Cardiac - s1 s2 regular Abd - soft, nontender Ext - no edema Neuro - normal strength,  Skin - no  rash Psych - normal mood/behavior  Dg Chest 2 View  11/28/2011  *RADIOLOGY REPORT*  Clinical Data: Cough, shortness of breath, congestion, asthma, bronchitis  CHEST - 2 VIEW  Comparison: 06/05/2011  Findings: Normal heart size, mediastinal contours, and pulmonary vascularity. Lungs minimally hyperexpanded with a vague area of opacity in the right upper lobe, could represent subtle infiltrate though underlying etiologies not excluded. Minimal interstitial prominence at the lower lungs. No pleural effusion or pneumothorax. No acute osseous findings. Surgical clips upper outer left breast.  IMPRESSION: Questionable vague area of infiltrate in the right upper lobe. However radiographic follow-up until resolution is recommended to ensure resolution and exclude underlying pulmonary nodule.   Original Report Authenticated By: Lollie Marrow, M.D.        Assessment & Plan:   Pneumonia Recurrent RUL pneumonia with asthma flare ?atopic or mold component?  Plan Avelox daily for 5days No other changes in medications No  prednisone Pneumovax    Updated Medication List Outpatient Encounter Prescriptions as of 11/28/2011  Medication Sig Dispense Refill  . acetaminophen (TYLENOL ARTHRITIS PAIN) 650 MG CR tablet Take 1,300 mg by mouth 2 (two) times daily.        . Aloe-Sodium Chloride (AYR SALINE NASAL GEL NA) by Nasal route. Per box        . Alpha-D-Galactosidase (BEANO) TABS Take by mouth daily.       . Alpha-Lipoic Acid 600 MG CAPS Take by mouth. Once a day       . Alum & Mag Hydroxide-Simeth (MAGIC MOUTHWASH) SOLN Take 5-10 mLs by mouth 3 (three) times daily as needed.  240 mL  3  . Artificial Tear Ointment (REFRESH P.M. OP) Apply to eye as needed. Apply small amount under eye lids at bedtime      . Azelastine HCl 0.15 % SOLN Place 1 spray into the nose daily.      . Calcium Citrate-Vitamin D (CITRACAL MAXIMUM) 315-250 MG-UNIT TABS Take by mouth. 1 capsule twice a day       . Cholecalciferol (VITAMIN D) 2000 UNITS CAPS Take by mouth. 1/2 at bedtime       . DULERA 100-5 MCG/ACT AERO INHALE 2 PUFFS BY MOUTH TWICE A DAY.  1 Inhaler  5  . EPIPEN 2-PAK 0.3 MG/0.3ML DEVI USE AS DIRECTED  2 each  2  . ESTRACE VAGINAL 0.1 MG/GM vaginal cream once a week      . gabapentin (NEURONTIN) 600 MG tablet Once a day       . hydrocortisone (PROCTOZONE-HC) 2.5 % rectal cream As needed       . lactase (LACTAID) 3000 UNITS tablet 3-4 tabs with dairy liquids and solids       . levocetirizine (XYZAL) 5 MG tablet Take 5 mg by mouth daily.      Marland Kitchen lidocaine (LIDODERM) 5 % Place 1 patch onto the skin daily as needed. Remove & Discard patch within 12 hours or as directed by MD      . Melatonin 3 MG CAPS Take 9 mg by mouth. 3 tabs at bedtime      . montelukast (SINGULAIR) 10 MG tablet Daily.      Marland Kitchen NASONEX 50 MCG/ACT nasal spray USE 2 SPRAYS IN EACH NOSTRIL DAILY  17 g  2  . neomycin-bacitracin-polymyxin (TRIPLE ANTIBIOTIC) ophthalmic ointment Apply as needed       . nystatin-triamcinolone (MYCOLOG II) cream Apply as needed       .  omalizumab (XOLAIR) 150 MG injection  150 SQ every 4 weeks      . Pancrelipase, Lip-Prot-Amyl, (CREON) 24000 UNITS CPEP Take by mouth. 1 with each meal       . Polyethyl Glycol-Propyl Glycol (SYSTANE ULTRA) 0.4-0.3 % SOLN Apply to eye. 1-2 drops each eye every morning and at bedtime       . Prenatal Vit-DSS-Fe Cbn-FA (VINATE ULTRA) TABS Take by mouth. Once a day       . Probiotic Product (ALIGN) 4 MG CAPS Take by mouth. Once a day       . propylthiouracil (PTU) 50 MG tablet 1 tablet twice a day       . Pseudoephedrine HCl (SUDAFED PO) Take by mouth as needed.       . Simethicone (GAS-X EXTRA STRENGTH) 125 MG CAPS Take by mouth. Per box       . zoledronic acid (RECLAST) 5 MG/100ML SOLN Once a year       . DISCONTD: mometasone-formoterol (DULERA) 100-5 MCG/ACT AERO Inhale 2 puffs into the lungs 2 (two) times daily.       Marland Kitchen DISCONTD: pseudoephedrine-guaifenesin (MUCINEX D) 60-600 MG per tablet       . guaiFENesin (MUCINEX) 600 MG 12 hr tablet Take 2 tablets (1,200 mg total) by mouth 2 (two) times daily.  60 tablet  2  . moxifloxacin (AVELOX) 400 MG tablet Take 1 tablet (400 mg total) by mouth daily.  5 tablet  0

## 2011-11-28 NOTE — Patient Instructions (Addendum)
Avelox daily for 5days No other changes in medications Chest xray today Change to plain mucinex Pneumovax today Return 1 month

## 2011-11-28 NOTE — Telephone Encounter (Signed)
Spoke with patient- aware that PW will review and advise as soon as possible. Pt was very understanding of this and will await a phone call from our office.   PW please advise. Thanks

## 2011-11-28 NOTE — Telephone Encounter (Signed)
I spoke with patient about results and she verbalized understanding and had no questions 

## 2011-11-29 ENCOUNTER — Encounter: Payer: Self-pay | Admitting: Family Medicine

## 2011-11-30 ENCOUNTER — Encounter: Payer: Self-pay | Admitting: Family Medicine

## 2011-12-04 ENCOUNTER — Telehealth: Payer: Self-pay | Admitting: Critical Care Medicine

## 2011-12-04 NOTE — Telephone Encounter (Addendum)
Last OV 11-28-11 with Dr. Delford Field. I spoke with the pt and she states she finished course of Avelox yesterday on 12-03-11. Pt states SOB is improved, but she is still having a dry cough, chest tightness, fatigue, and overall "does not feel well." Pt is concerned that she is still having symptoms because " this is her second case of pneumonia in the last 6 months.  Pt wanted to know should she wait a few more days for symptoms to resolve or is more medication needed.  Pt also states she is scheduled to get her xolair shot tomorrow and wanted to know if it was ok to receive it with these symptoms. Please advise.Carron Curie, CMA Allergies  Allergen Reactions  . Dust Mite Extract   . Eggs Or Egg-Derived Products   . Hydrocodone-Acetaminophen     REACTION: unspecified  . Levofloxacin     REACTION: GI upset  . Milk-Related Compounds   . Molds & Smuts   . Morphine Sulfate     REACTION: pruritis  . Other     Grass and weeds  . Oxycodone Hcl   . Paroxetine     REACTION: unspecified  . Penicillins     REACTION: rash  . Rofecoxib     REACTION: feet swelling  . Shrimp Flavor   . Soy Allergy     Limited soy d/t cancer  . Strawberry   . Tetracycline Hcl     REACTION: unspecified  . Tree Extract

## 2011-12-04 NOTE — Telephone Encounter (Signed)
Can have Xolair as she has finished full course of ABX  follow up as planned and As needed   Please contact office for sooner follow up if symptoms do not improve or worsen or seek emergency care

## 2011-12-04 NOTE — Telephone Encounter (Signed)
Called spoke with patient, advised of TP's recs as stated below.  Pt okay with these recs and verbalized her understanding.  Pt to call if her symptoms do not improve or worsen.  Will sign and forward to PW as FYI.

## 2011-12-05 ENCOUNTER — Ambulatory Visit (INDEPENDENT_AMBULATORY_CARE_PROVIDER_SITE_OTHER): Payer: Medicare Other

## 2011-12-05 DIAGNOSIS — J45909 Unspecified asthma, uncomplicated: Secondary | ICD-10-CM

## 2011-12-05 NOTE — Telephone Encounter (Signed)
i agree with TP recs

## 2011-12-06 ENCOUNTER — Encounter: Payer: Self-pay | Admitting: Critical Care Medicine

## 2011-12-06 MED ORDER — OMALIZUMAB 150 MG ~~LOC~~ SOLR
150.0000 mg | Freq: Once | SUBCUTANEOUS | Status: AC
  Administered 2011-12-06: 150 mg via SUBCUTANEOUS

## 2011-12-06 NOTE — Telephone Encounter (Signed)
Per last ov w/ PW: Patient Instructions     Avelox daily for 5days  No other changes in medications  Chest xray today  Change to plain mucinex  Pneumovax today  Return 1 month    Dr Delford Field, per pt's email  I am still not feeling very well. Doing a lot of coughing, not much yellow coming up.  Am very tired as well. Will this pass in a few days or do I need to do more.  I am following all procedures as Dr Delford Field instructed, including the nettie pot daily and have changed to regular Mucinex. Chest is till tight and shortness of breath continues.  Any advise?   Please advise, thanks.

## 2011-12-07 ENCOUNTER — Encounter: Payer: Self-pay | Admitting: Critical Care Medicine

## 2011-12-07 MED ORDER — GEMIFLOXACIN MESYLATE 320 MG PO TABS: 320.0000 mg | ORAL_TABLET | Freq: Every day | ORAL | Status: DC

## 2011-12-07 NOTE — Telephone Encounter (Signed)
Ok to add these allergies to the pt allergy list  Call in [place] 320mg  daily for 5 more days

## 2011-12-07 NOTE — Telephone Encounter (Signed)
I have sent this into the pharmacy. 

## 2012-01-01 ENCOUNTER — Ambulatory Visit: Payer: Medicare Other | Admitting: Critical Care Medicine

## 2012-01-02 ENCOUNTER — Ambulatory Visit (INDEPENDENT_AMBULATORY_CARE_PROVIDER_SITE_OTHER): Payer: Medicare Other | Admitting: Critical Care Medicine

## 2012-01-02 ENCOUNTER — Ambulatory Visit (INDEPENDENT_AMBULATORY_CARE_PROVIDER_SITE_OTHER): Payer: Medicare Other

## 2012-01-02 ENCOUNTER — Other Ambulatory Visit: Payer: Medicare Other

## 2012-01-02 ENCOUNTER — Encounter: Payer: Self-pay | Admitting: Critical Care Medicine

## 2012-01-02 VITALS — BP 118/68 | HR 74 | Temp 98.1°F | Ht 61.25 in | Wt 110.0 lb

## 2012-01-02 DIAGNOSIS — J45909 Unspecified asthma, uncomplicated: Secondary | ICD-10-CM

## 2012-01-02 DIAGNOSIS — J189 Pneumonia, unspecified organism: Secondary | ICD-10-CM

## 2012-01-02 MED ORDER — MAGIC MOUTHWASH: 5.0000 mL | Freq: Three times a day (TID) | ORAL | Status: DC | PRN

## 2012-01-02 MED ORDER — OMALIZUMAB 150 MG ~~LOC~~ SOLR
150.0000 mg | Freq: Once | SUBCUTANEOUS | Status: AC
  Administered 2012-01-02: 150 mg via SUBCUTANEOUS

## 2012-01-02 NOTE — Assessment & Plan Note (Signed)
Moderate to severe persistent asthma with significant atopic features stable at this time Significant prior mold exposure in the home environment Plan Magic mouthwash refilled Labs today to check mold sensitivity No change in medications Return 2 months

## 2012-01-02 NOTE — Progress Notes (Signed)
Subjective:    Patient ID: Ebony Scott, female    DOB: [birth year], 61 y.o.   MRN: [id number]  HPI  61 y.o.. WF atopic moderate persistent asthma, elevated IgE. Started Xolair 05/03/10  Initial Pulmonary Consultation 12/23/09  01/02/2012 Pt is improved. Still with cough not as much.  Rx Factive: 2weeks ago finished. No chest pain. No wheezing  PFR 250>>>300. No f/c/s.   Past Medical History  Diagnosis Date  . Chronic neck pain   . Allergic rhinitis   . Moderate persistent asthma     -FeV1 72% 2011, -IgE 102 2011, CT sinus Neg 2011  . Breast cancer     in remission  . GERD (gastroesophageal reflux disease)   . Hypothyroidism   . Osteoporosis     on reclast yearly  . Hiatal hernia   . Diverticulosis   . Hemorrhoids   . Asthma   . IBS (irritable bowel syndrome)      Family History  Problem Relation Age of Onset  . Allergies Mother   . Allergies Father   . Heart disease Mother   . Heart disease Father   . Arthritis Mother   . Arthritis Father   . Lung cancer Mother   . Colon cancer      Maternal half uncle  . Colon cancer      Maternal half aunt  . Colitis Daughter     Review of Systems Constitutional:   No  weight loss, night sweats,  Fevers, chills,  +fatigue, or  lassitude.  HEENT:   No headaches,  Difficulty swallowing,  Tooth/dental problems, or  Sore throat,                No sneezing, itching, ear ache,  +nasal congestion, post nasal drip,   CV:  No chest pain,  Orthopnea, PND, swelling in lower extremities, anasarca, dizziness, palpitations, syncope.   GI  No heartburn, indigestion, abdominal pain, nausea, vomiting, diarrhea, change in bowel habits, loss of appetite, bloody stools.   Resp:  ,  No coughing up of blood.   No chest wall deformity  Skin: no rash or lesions.  GU: no dysuria, change in color of urine, no urgency or frequency.  No flank pain, no hematuria   MS:  No joint pain or swelling.  No decreased range of motion.  No back  pain.  Psych:  No change in mood or affect. No depression or anxiety.  No memory loss.    '     Objective:   Physical Exam   BP 118/68  Pulse 74  Temp 98.1 F (36.7 C) (Oral)  Ht 5' 1.25" (1.556 m)  Wt 110 lb (49.896 kg)  BMI 20.62 kg/m2  SpO2 98%  HEENT - pale nasal mucosa, no sinus tenderness, TM clear, no oral exudate, no LAN Chest - clearer Cardiac - s1 s2 regular Abd - soft, nontender Ext - no edema Neuro - normal strength,  Skin - no rash Psych - normal mood/behavior  No results found.     Assessment & Plan:   Asthma with allergic rhinitis Moderate to severe persistent asthma with significant atopic features stable at this time Significant prior mold exposure in the home environment Plan Magic mouthwash refilled Labs today to check mold sensitivity No change in medications Return 2 months   Pneumonia History of pneumonia right lower lobe now resolved    Updated Medication List Outpatient Encounter Prescriptions as of 01/02/2012  Medication Sig Dispense Refill  . acetaminophen (  TYLENOL ARTHRITIS PAIN) 650 MG CR tablet Take 1,300 mg by mouth 2 (two) times daily.        . Aloe-Sodium Chloride (AYR SALINE NASAL GEL NA) by Nasal route. Per box        . Alpha-D-Galactosidase (BEANO) TABS Take by mouth daily.       . Alpha-Lipoic Acid 600 MG CAPS Take by mouth. Once a day       . Alum & Mag Hydroxide-Simeth (MAGIC MOUTHWASH) SOLN Take 5-10 mLs by mouth 3 (three) times daily as needed.  240 mL  3  . Artificial Tear Ointment (REFRESH P.M. OP) Apply to eye as needed. Apply small amount under eye lids at bedtime      . Azelastine HCl 0.15 % SOLN Place 1 spray into the nose daily.      . Calcium Citrate-Vitamin D (CITRACAL MAXIMUM) 315-250 MG-UNIT TABS Take by mouth. 1 capsule twice a day       . Cholecalciferol (VITAMIN D) 2000 UNITS CAPS Take by mouth. 1/2 at bedtime       . EPIPEN 2-PAK 0.3 MG/0.3ML DEVI USE AS DIRECTED  2 each  2  . ESTRACE VAGINAL 0.1  MG/GM vaginal cream once a week      . gabapentin (NEURONTIN) 600 MG tablet Once a day      . guaiFENesin (MUCINEX) 600 MG 12 hr tablet Take 600 mg by mouth 2 (two) times daily.      . hydrocortisone (PROCTOZONE-HC) 2.5 % rectal cream As needed       . lactase (LACTAID) 3000 UNITS tablet 3-4 tabs with dairy liquids and solids       . levocetirizine (XYZAL) 5 MG tablet Take 5 mg by mouth daily.      Marland Kitchen lidocaine (LIDODERM) 5 % Place 1 patch onto the skin daily as needed. Remove & Discard patch within 12 hours or as directed by MD      . Melatonin 3 MG CAPS Take 9 mg by mouth. 3 tabs at bedtime      . mometasone-formoterol (DULERA) 100-5 MCG/ACT AERO       . montelukast (SINGULAIR) 10 MG tablet Daily.      Marland Kitchen NASONEX 50 MCG/ACT nasal spray USE 2 SPRAYS IN EACH NOSTRIL DAILY  17 g  2  . neomycin-bacitracin-polymyxin (TRIPLE ANTIBIOTIC) ophthalmic ointment Apply as needed       . nystatin-triamcinolone (MYCOLOG II) cream Apply as needed       . omalizumab (XOLAIR) 150 MG injection 150 SQ every 4 weeks      . Pancrelipase, Lip-Prot-Amyl, (CREON) 24000 UNITS CPEP Take by mouth. 1 with each meal       . Polyethyl Glycol-Propyl Glycol (SYSTANE ULTRA) 0.4-0.3 % SOLN Apply to eye. 1-2 drops each eye every morning and at bedtime       . Prenatal Vit-Sel-Fe Fum-FA (VINATE M) 27-1 MG TABS Take 1 tablet by mouth daily.      . Probiotic Product (ALIGN) 4 MG CAPS Take by mouth. Once a day       . propylthiouracil (PTU) 50 MG tablet 1 tablet twice a day       . Simethicone (GAS-X EXTRA STRENGTH) 125 MG CAPS Take by mouth. Per box       . zoledronic acid (RECLAST) 5 MG/100ML SOLN Once a year       . [DISCONTINUED] Alum & Mag Hydroxide-Simeth (MAGIC MOUTHWASH) SOLN Take 5-10 mLs by mouth 3 (three) times daily as needed.  240 mL  3  . [DISCONTINUED] DULERA 100-5 MCG/ACT AERO INHALE 2 PUFFS BY MOUTH TWICE A DAY.  1 Inhaler  5  . [DISCONTINUED] guaiFENesin (MUCINEX) 600 MG 12 hr tablet Take 2 tablets (1,200 mg total)  by mouth 2 (two) times daily.  60 tablet  2  . [DISCONTINUED] Prenatal Vit-DSS-Fe Cbn-FA (VINATE ULTRA) TABS Take by mouth. Once a day       . [DISCONTINUED] gemifloxacin (FACTIVE) 320 MG tablet Take 1 tablet (320 mg total) by mouth daily.  5 tablet  0  . [DISCONTINUED] moxifloxacin (AVELOX) 400 MG tablet Take 1 tablet (400 mg total) by mouth daily.  5 tablet  0  . [DISCONTINUED] Pseudoephedrine HCl (SUDAFED PO) Take by mouth as needed.

## 2012-01-02 NOTE — Patient Instructions (Addendum)
Magic mouthwash refilled Labs today to check mold sensitivity No change in medications Return 2 months

## 2012-01-02 NOTE — Assessment & Plan Note (Signed)
History of pneumonia right lower lobe now resolved

## 2012-01-03 LAB — ALLERGY FULL PROFILE
Allergen, D pternoyssinus,d7: 0.32 kU/L — ABNORMAL HIGH
Allergen,Goose feathers, e70: 0.21 kU/L — ABNORMAL HIGH
Alternaria Alternata: 0.2 kU/L — ABNORMAL HIGH
Aspergillus fumigatus, IgG: 3.92 kU/L — ABNORMAL HIGH
Bahia Grass: 0.56 kU/L — ABNORMAL HIGH
Bermuda Grass: 0.27 kU/L — ABNORMAL HIGH
Box Elder IgE: 0.25 kU/L — ABNORMAL HIGH
Candida Albicans: 0.25 kU/L — ABNORMAL HIGH
Cat Dander: <0.1 kU/L
Common Ragweed: 0.42 kU/L — ABNORMAL HIGH
Curvularia lunata: 0.14 kU/L — ABNORMAL HIGH
D. farinae: 0.25 kU/L — ABNORMAL HIGH
Dog Dander: 0.15 kU/L — ABNORMAL HIGH
Elm IgE: 0.21 kU/L — ABNORMAL HIGH
Fescue: 0.55 kU/L — ABNORMAL HIGH
G005 Rye, Perennial: 0.57 kU/L — ABNORMAL HIGH
G009 Red Top: 0.47 kU/L — ABNORMAL HIGH
Goldenrod: 0.24 kU/L — ABNORMAL HIGH
Helminthosporium halodes: 0.14 kU/L — ABNORMAL HIGH
House Dust Hollister: 0.17 kU/L — ABNORMAL HIGH
IgE (Immunoglobulin E), Serum: 825.8 IU/mL — ABNORMAL HIGH (ref 0.0–180.0)
Lamb's Quarters: 0.3 kU/L — ABNORMAL HIGH
Oak: 0.3 kU/L — ABNORMAL HIGH
Plantain: 0.31 kU/L — ABNORMAL HIGH
Stemphylium Botryosum: 0.14 kU/L — ABNORMAL HIGH
Sycamore Tree: 0.34 kU/L — ABNORMAL HIGH
Timothy Grass: 0.49 kU/L — ABNORMAL HIGH

## 2012-01-07 LAB — FUNGAL ANTIBODIES PANEL, ID-BLOOD
Aspergillus Flavus Antibodies: NEGATIVE
Aspergillus Niger Antibodies: NEGATIVE
Aspergillus fumigatus: NEGATIVE
Blastomyces Abs, Qn, DID: NEGATIVE
Coccidioides Antibody ID: NEGATIVE
Histoplasma Antibody, ID: NEGATIVE

## 2012-01-24 ENCOUNTER — Telehealth: Payer: Self-pay | Admitting: Critical Care Medicine

## 2012-01-24 NOTE — Telephone Encounter (Signed)
Needs CXR and OV tomorrow  ??TP

## 2012-01-24 NOTE — Telephone Encounter (Signed)
Called and spoke with pt and she stated that she is having nosebleeds, cough with yellow sputum, chest tightness, feeling sick and she is really worried about getting PNA again since she has already had this 2 times this year.  Pt is requesting recs from PW on how to proceed with this.  PW please advise. Thanks  Allergies  Allergen Reactions  . Dust Mite Extract   . Eggs Or Egg-Derived Products   . Hydrocodone-Acetaminophen     REACTION: unspecified  . Levofloxacin     REACTION: GI upset  . Milk-Related Compounds   . Molds & Smuts   . Morphine Sulfate     REACTION: pruritis  . Other     Grass and weeds  . Oxycodone Hcl   . Paroxetine     REACTION: unspecified  . Penicillins     REACTION: rash  . Rofecoxib     REACTION: feet swelling  . Shrimp Flavor   . Soy Allergy     Limited soy d/t cancer  . Strawberry   . Tetracycline Hcl     REACTION: unspecified  . Tree Extract

## 2012-01-24 NOTE — Telephone Encounter (Signed)
Talked with pt and she agrees to come in and be seen. Appointment tomorrow with TP at 11:45am.

## 2012-01-25 ENCOUNTER — Ambulatory Visit (INDEPENDENT_AMBULATORY_CARE_PROVIDER_SITE_OTHER)
Admission: RE | Admit: 2012-01-25 | Discharge: 2012-01-25 | Disposition: A | Discharge status: Home / Self Care | Payer: Medicare Other | Source: Ambulatory Visit | Attending: Adult Health | Admitting: Adult Health

## 2012-01-25 ENCOUNTER — Encounter: Payer: Self-pay | Admitting: Adult Health

## 2012-01-25 ENCOUNTER — Ambulatory Visit (INDEPENDENT_AMBULATORY_CARE_PROVIDER_SITE_OTHER): Payer: Medicare Other | Admitting: Adult Health

## 2012-01-25 VITALS — BP 122/64 | HR 64 | Temp 97.3°F | Ht 61.25 in | Wt 110.2 lb

## 2012-01-25 DIAGNOSIS — J189 Pneumonia, unspecified organism: Secondary | ICD-10-CM

## 2012-01-25 DIAGNOSIS — J209 Acute bronchitis, unspecified: Secondary | ICD-10-CM

## 2012-01-25 MED ORDER — CEFDINIR 300 MG PO CAPS: 300.0000 mg | ORAL_CAPSULE | Freq: Two times a day (BID) | ORAL | Status: DC

## 2012-01-25 NOTE — Patient Instructions (Addendum)
Omnicef 300mg  Twice daily for 7 days .  Mucinex Twice daily  As needed  Congestion  Delsym 2 tsp Twice daily  As needed  Cough  Saline nasal rinses As needed   Please contact office for sooner follow up if symptoms do not improve or worsen or seek emergency care

## 2012-01-25 NOTE — Progress Notes (Signed)
Subjective:    Patient ID: Ebony Scott, female    DOB: [birth year], 61 y.o.   MRN: [id number]  HPI  61 y.o.. WF atopic moderate persistent asthma, elevated IgE. Started Xolair 05/03/10  Initial Pulmonary Consultation 12/23/09  01/25/2012 Acute OV  Complains of tightness/heaviness in chest, head congestion/pressure w/ yellow mucus, prod cough w/ yellow mucus x3days. No hemoptysis or fever. No edema.  Is having her carpet removed in her house  Recent labs last month with elevated IGE at ~800 and very positive RAST  Past Medical History  Diagnosis Date  . Chronic neck pain   . Allergic rhinitis   . Moderate persistent asthma     -FeV1 72% 2011, -IgE 102 2011, CT sinus Neg 2011  . Breast cancer     in remission  . GERD (gastroesophageal reflux disease)   . Hypothyroidism   . Osteoporosis     on reclast yearly  . Hiatal hernia   . Diverticulosis   . Hemorrhoids   . Asthma   . IBS (irritable bowel syndrome)      Family History  Problem Relation Age of Onset  . Allergies Mother   . Allergies Father   . Heart disease Mother   . Heart disease Father   . Arthritis Mother   . Arthritis Father   . Lung cancer Mother   . Colon cancer      Maternal half uncle  . Colon cancer      Maternal half aunt  . Colitis Daughter     Review of Systems Constitutional:   No  weight loss, night sweats,  Fevers, chills,  +fatigue, or  lassitude.  HEENT:   No headaches,  Difficulty swallowing,  Tooth/dental problems, or  Sore throat,                No sneezing, itching, ear ache,  +nasal congestion, post nasal drip,   CV:  No chest pain,  Orthopnea, PND, swelling in lower extremities, anasarca, dizziness, palpitations, syncope.   GI  No heartburn, indigestion, abdominal pain, nausea, vomiting, diarrhea, change in bowel habits, loss of appetite, bloody stools.   Resp:  ,  No coughing up of blood.   No chest wall deformity  Skin: no rash or lesions.  GU: no dysuria, change in color of  urine, no urgency or frequency.  No flank pain, no hematuria   MS:  No joint pain or swelling.  No decreased range of motion.  No back pain.  Psych:  No change in mood or affect. No depression or anxiety.  No memory loss.    '     Objective:   Physical Exam   BP 122/64  Pulse 64  Temp 97.3 F (36.3 C) (Oral)  Ht 5' 1.25" (1.556 m)  Wt 110 lb 3.2 oz (49.986 kg)  BMI 20.65 kg/m2  SpO2 97%  GEN: A/Ox3; pleasant , NAD, well nourished   HEENT:  [place]/AT,  EACs-clear, TMs-wnl, NOSE-clear drainage , THROAT-clear, no lesions, no postnasal drip or exudate noted.   NECK:  Supple w/ fair ROM; no JVD; normal carotid impulses w/o bruits; no thyromegaly or nodules palpated; no lymphadenopathy.  RESP  Clear  P & A; w/o, wheezes/ rales/ or rhonchi.no accessory muscle use, no dullness to percussion  CARD:  RRR, no m/r/g  , no peripheral edema, pulses intact, no cyanosis or clubbing.  GI:   Soft & nt; nml bowel sounds; no organomegaly or masses detected.  Musco: Warm bil, no  deformities or joint swelling noted.   Neuro: alert, no focal deficits noted.    Skin: Warm, no lesions or rashes   Dg Chest 2 View  01/25/2012  *RADIOLOGY REPORT*  Clinical Data: Cough, chest heaviness, dizziness, asthma, question pneumonia  CHEST - 2 VIEW  Comparison: 11/28/2011  Findings: Normal heart size, mediastinal contours, and pulmonary vascularity. Atherosclerotic calcification aorta. Lungs appear slightly hyperinflated with minimal peribronchial thickening likely related to patient's history of asthma. No pulmonary infiltrate, pleural effusion or pneumothorax. Surgical clips left breast. Bones appear demineralized. Prior cervical spine surgery.  IMPRESSION: No acute abnormalities.   Original Report Authenticated By: Ulyses Southward, M.D.        Assessment & Plan:

## 2012-01-25 NOTE — Assessment & Plan Note (Signed)
Flare with AR flare  She has multiple drug allergies, has taken omnicef in past without difficulty  Plan  Omnicef 300mg  Twice daily for 7 days .  Mucinex Twice daily  As needed  Congestion  Delsym 2 tsp Twice daily  As needed  Cough  Saline nasal rinses As needed   Please contact office for sooner follow up if symptoms do not improve or worsen or seek emergency care

## 2012-01-30 ENCOUNTER — Ambulatory Visit (INDEPENDENT_AMBULATORY_CARE_PROVIDER_SITE_OTHER): Payer: Medicare Other

## 2012-01-30 DIAGNOSIS — J45909 Unspecified asthma, uncomplicated: Secondary | ICD-10-CM

## 2012-01-31 DIAGNOSIS — J45909 Unspecified asthma, uncomplicated: Secondary | ICD-10-CM

## 2012-01-31 MED ORDER — OMALIZUMAB 150 MG ~~LOC~~ SOLR
150.0000 mg | Freq: Once | SUBCUTANEOUS | Status: AC
  Administered 2012-01-31: 150 mg via SUBCUTANEOUS

## 2012-02-14 ENCOUNTER — Other Ambulatory Visit (HOSPITAL_COMMUNITY): Payer: Self-pay | Admitting: *Deleted

## 2012-02-15 ENCOUNTER — Encounter (HOSPITAL_COMMUNITY)
Admission: RE | Admit: 2012-02-15 | Discharge: 2012-02-15 | Disposition: A | Discharge status: Home / Self Care | Payer: Medicare Other | Source: Ambulatory Visit | Attending: Family Medicine | Admitting: Family Medicine

## 2012-02-15 DIAGNOSIS — M810 Age-related osteoporosis without current pathological fracture: Secondary | ICD-10-CM | POA: Insufficient documentation

## 2012-02-15 MED ORDER — ZOLEDRONIC ACID 5 MG/100ML IV SOLN
INTRAVENOUS | Status: AC
  Administered 2012-02-15: 5 mg via INTRAVENOUS
  Filled 2012-02-15: qty 100

## 2012-02-15 MED ORDER — ZOLEDRONIC ACID 5 MG/100ML IV SOLN
5.0000 mg | Freq: Once | INTRAVENOUS | Status: AC
  Administered 2012-02-15: 5 mg via INTRAVENOUS

## 2012-02-20 ENCOUNTER — Inpatient Hospital Stay (HOSPITAL_COMMUNITY)
Admission: EM | Admit: 2012-02-20 | Discharge: 2012-02-26 | DRG: 312 | Disposition: A | Discharge status: Home Health | Payer: Medicare Other | Attending: Internal Medicine | Admitting: Internal Medicine

## 2012-02-20 ENCOUNTER — Emergency Department (HOSPITAL_COMMUNITY): Payer: Medicare Other

## 2012-02-20 ENCOUNTER — Encounter (HOSPITAL_COMMUNITY): Payer: Self-pay | Admitting: Emergency Medicine

## 2012-02-20 DIAGNOSIS — K219 Gastro-esophageal reflux disease without esophagitis: Secondary | ICD-10-CM | POA: Diagnosis present

## 2012-02-20 DIAGNOSIS — M109 Gout, unspecified: Secondary | ICD-10-CM

## 2012-02-20 DIAGNOSIS — IMO0001 Reserved for inherently not codable concepts without codable children: Secondary | ICD-10-CM | POA: Diagnosis present

## 2012-02-20 DIAGNOSIS — J309 Allergic rhinitis, unspecified: Secondary | ICD-10-CM

## 2012-02-20 DIAGNOSIS — Z981 Arthrodesis status: Secondary | ICD-10-CM

## 2012-02-20 DIAGNOSIS — K589 Irritable bowel syndrome without diarrhea: Secondary | ICD-10-CM

## 2012-02-20 DIAGNOSIS — M1120 Other chondrocalcinosis, unspecified site: Secondary | ICD-10-CM | POA: Diagnosis present

## 2012-02-20 DIAGNOSIS — E0590 Thyrotoxicosis, unspecified without thyrotoxic crisis or storm: Secondary | ICD-10-CM | POA: Diagnosis present

## 2012-02-20 DIAGNOSIS — E871 Hypo-osmolality and hyponatremia: Secondary | ICD-10-CM | POA: Diagnosis not present

## 2012-02-20 DIAGNOSIS — K449 Diaphragmatic hernia without obstruction or gangrene: Secondary | ICD-10-CM | POA: Diagnosis present

## 2012-02-20 DIAGNOSIS — E7800 Pure hypercholesterolemia, unspecified: Secondary | ICD-10-CM | POA: Diagnosis present

## 2012-02-20 DIAGNOSIS — Z79899 Other long term (current) drug therapy: Secondary | ICD-10-CM

## 2012-02-20 DIAGNOSIS — D649 Anemia, unspecified: Secondary | ICD-10-CM | POA: Diagnosis present

## 2012-02-20 DIAGNOSIS — J069 Acute upper respiratory infection, unspecified: Secondary | ICD-10-CM

## 2012-02-20 DIAGNOSIS — J209 Acute bronchitis, unspecified: Secondary | ICD-10-CM

## 2012-02-20 DIAGNOSIS — M542 Cervicalgia: Secondary | ICD-10-CM

## 2012-02-20 DIAGNOSIS — M810 Age-related osteoporosis without current pathological fracture: Secondary | ICD-10-CM

## 2012-02-20 DIAGNOSIS — R55 Syncope and collapse: Principal | ICD-10-CM | POA: Diagnosis present

## 2012-02-20 DIAGNOSIS — I82409 Acute embolism and thrombosis of unspecified deep veins of unspecified lower extremity: Secondary | ICD-10-CM | POA: Diagnosis present

## 2012-02-20 DIAGNOSIS — Z853 Personal history of malignant neoplasm of breast: Secondary | ICD-10-CM

## 2012-02-20 DIAGNOSIS — M2550 Pain in unspecified joint: Secondary | ICD-10-CM

## 2012-02-20 DIAGNOSIS — I824Z9 Acute embolism and thrombosis of unspecified deep veins of unspecified distal lower extremity: Secondary | ICD-10-CM | POA: Diagnosis not present

## 2012-02-20 DIAGNOSIS — J45909 Unspecified asthma, uncomplicated: Secondary | ICD-10-CM

## 2012-02-20 DIAGNOSIS — E039 Hypothyroidism, unspecified: Secondary | ICD-10-CM

## 2012-02-20 DIAGNOSIS — M79605 Pain in left leg: Secondary | ICD-10-CM | POA: Diagnosis present

## 2012-02-20 DIAGNOSIS — E785 Hyperlipidemia, unspecified: Secondary | ICD-10-CM | POA: Diagnosis present

## 2012-02-20 DIAGNOSIS — M79673 Pain in unspecified foot: Secondary | ICD-10-CM

## 2012-02-20 HISTORY — DX: Low back pain: M54.5

## 2012-02-20 HISTORY — DX: Fibromyalgia: M79.7

## 2012-02-20 HISTORY — DX: Pneumonia, unspecified organism: J18.9

## 2012-02-20 HISTORY — DX: Dyspnea, unspecified: R06.00

## 2012-02-20 HISTORY — DX: Thyrotoxicosis with diffuse goiter without thyrotoxic crisis or storm: E05.00

## 2012-02-20 HISTORY — DX: Headache: R51

## 2012-02-20 HISTORY — DX: Unspecified osteoarthritis, unspecified site: M19.90

## 2012-02-20 HISTORY — DX: Other complications of anesthesia, initial encounter: T88.59XA

## 2012-02-20 HISTORY — DX: Major depressive disorder, single episode, unspecified: F32.9

## 2012-02-20 HISTORY — DX: Other forms of dyspnea: R06.09

## 2012-02-20 HISTORY — DX: Pure hypercholesterolemia, unspecified: E78.00

## 2012-02-20 HISTORY — DX: Depression, unspecified: F32.A

## 2012-02-20 HISTORY — DX: Adverse effect of unspecified anesthetic, initial encounter: T41.45XA

## 2012-02-20 HISTORY — DX: Other chronic pain: G89.29

## 2012-02-20 LAB — COMPREHENSIVE METABOLIC PANEL
ALT: 32 U/L (ref 0–35)
AST: 29 U/L (ref 0–37)
Albumin: 3.6 g/dL (ref 3.5–5.2)
Alkaline Phosphatase: 50 U/L (ref 39–117)
BUN: 14 mg/dL (ref 6–23)
CO2: 22 mEq/L (ref 19–32)
Calcium: 9.8 mg/dL (ref 8.4–10.5)
Chloride: 102 mEq/L (ref 96–112)
Creatinine, Ser: 1.02 mg/dL (ref 0.50–1.10)
GFR calc Af Amer: 66 mL/min — ABNORMAL LOW (ref >90)
GFR calc non Af Amer: 57 mL/min — ABNORMAL LOW (ref >90)
Glucose, Bld: 90 mg/dL (ref 70–99)
Potassium: 4.1 mEq/L (ref 3.5–5.1)
Sodium: 138 mEq/L (ref 135–145)
Total Bilirubin: 0.9 mg/dL (ref 0.3–1.2)
Total Protein: 7.5 g/dL (ref 6.0–8.3)

## 2012-02-20 LAB — CBC
HCT: 34.1 % — ABNORMAL LOW (ref 36.0–46.0)
Hemoglobin: 11 g/dL — ABNORMAL LOW (ref 12.0–15.0)
MCH: 31.3 pg (ref 26.0–34.0)
MCHC: 32.3 g/dL (ref 30.0–36.0)
MCV: 96.9 fL (ref 78.0–100.0)
Platelets: 207 10*3/uL (ref 150–400)
RBC: 3.52 MIL/uL — ABNORMAL LOW (ref 3.87–5.11)
RDW: 13.1 % (ref 11.5–15.5)
WBC: 10 10*3/uL (ref 4.0–10.5)

## 2012-02-20 LAB — RAPID URINE DRUG SCREEN, HOSP PERFORMED
Amphetamines: NOT DETECTED
Barbiturates: NOT DETECTED
Benzodiazepines: NOT DETECTED
Cocaine: NOT DETECTED
Opiates: POSITIVE — AB
Tetrahydrocannabinol: NOT DETECTED

## 2012-02-20 LAB — URINALYSIS, ROUTINE W REFLEX MICROSCOPIC
Bilirubin Urine: NEGATIVE
Glucose, UA: NEGATIVE mg/dL
Hgb urine dipstick: NEGATIVE
Ketones, ur: 15 mg/dL — AB
Nitrite: NEGATIVE
Protein, ur: NEGATIVE mg/dL
Specific Gravity, Urine: 1.018 (ref 1.005–1.030)
Urobilinogen, UA: 0.2 mg/dL (ref 0.0–1.0)
pH: 7 (ref 5.0–8.0)

## 2012-02-20 LAB — TROPONIN I: Troponin I: <0.3 ng/mL (ref <0.30)

## 2012-02-20 LAB — URINE MICROSCOPIC-ADD ON

## 2012-02-20 LAB — INFLUENZA PANEL BY PCR (TYPE A & B)
H1N1 flu by pcr: NOT DETECTED
Influenza A By PCR: NEGATIVE
Influenza B By PCR: NEGATIVE

## 2012-02-20 MED ORDER — ENOXAPARIN SODIUM 30 MG/0.3ML ~~LOC~~ SOLN
30.0000 mg | SUBCUTANEOUS | Status: DC
  Administered 2012-02-20: 30 mg via SUBCUTANEOUS
  Filled 2012-02-20 (×2): qty 0.3

## 2012-02-20 MED ORDER — ONDANSETRON HCL 4 MG/2ML IJ SOLN: 4.0000 mg | Freq: Four times a day (QID) | INTRAMUSCULAR | Status: DC | PRN

## 2012-02-20 MED ORDER — HYDROMORPHONE HCL PF 1 MG/ML IJ SOLN
1.0000 mg | Freq: Once | INTRAMUSCULAR | Status: AC
  Administered 2012-02-20: 1 mg via INTRAVENOUS
  Filled 2012-02-20: qty 1

## 2012-02-20 MED ORDER — VINATE M 27-1 MG PO TABS: 1.0000 | ORAL_TABLET | Freq: Every day | ORAL | Status: DC

## 2012-02-20 MED ORDER — AZELASTINE HCL 0.15 % NA SOLN: 1.0000 | Freq: Every day | NASAL | Status: DC

## 2012-02-20 MED ORDER — GUAIFENESIN ER 600 MG PO TB12
600.0000 mg | ORAL_TABLET | Freq: Two times a day (BID) | ORAL | Status: DC
  Administered 2012-02-20 – 2012-02-26 (×12): 600 mg via ORAL
  Filled 2012-02-20 (×14): qty 1

## 2012-02-20 MED ORDER — AZELASTINE HCL 0.1 % NA SOLN
1.0000 | Freq: Every day | NASAL | Status: DC
  Administered 2012-02-20 – 2012-02-22 (×2): 1 via NASAL
  Filled 2012-02-20: qty 30

## 2012-02-20 MED ORDER — SODIUM CHLORIDE 0.9 % IJ SOLN
3.0000 mL | Freq: Two times a day (BID) | INTRAMUSCULAR | Status: DC
  Administered 2012-02-20 – 2012-02-26 (×11): 3 mL via INTRAVENOUS

## 2012-02-20 MED ORDER — ADULT MULTIVITAMIN W/MINERALS CH
1.0000 | ORAL_TABLET | Freq: Every day | ORAL | Status: DC
  Administered 2012-02-21 – 2012-02-26 (×6): 1 via ORAL
  Filled 2012-02-20 (×8): qty 1

## 2012-02-20 MED ORDER — FLUTICASONE PROPIONATE 50 MCG/ACT NA SUSP
2.0000 | Freq: Every day | NASAL | Status: DC
  Administered 2012-02-21 – 2012-02-26 (×6): 2 via NASAL
  Filled 2012-02-20: qty 16

## 2012-02-20 MED ORDER — PANCRELIPASE (LIP-PROT-AMYL) 12000-38000 UNITS PO CPEP
1.0000 | ORAL_CAPSULE | Freq: Three times a day (TID) | ORAL | Status: DC
  Administered 2012-02-21 – 2012-02-25 (×9): 1 via ORAL
  Filled 2012-02-20 (×19): qty 1

## 2012-02-20 MED ORDER — ONDANSETRON HCL 4 MG PO TABS
4.0000 mg | ORAL_TABLET | Freq: Four times a day (QID) | ORAL | Status: DC | PRN
  Administered 2012-02-21 – 2012-02-22 (×3): 4 mg via ORAL
  Filled 2012-02-20 (×3): qty 1

## 2012-02-20 MED ORDER — SODIUM CHLORIDE 0.9 % IV BOLUS (SEPSIS)
1000.0000 mL | Freq: Once | INTRAVENOUS | Status: AC
  Administered 2012-02-20: 1000 mL via INTRAVENOUS

## 2012-02-20 MED ORDER — GABAPENTIN 600 MG PO TABS
600.0000 mg | ORAL_TABLET | Freq: Every day | ORAL | Status: DC
  Administered 2012-02-20 – 2012-02-25 (×5): 600 mg via ORAL
  Filled 2012-02-20 (×9): qty 1

## 2012-02-20 MED ORDER — ACETAMINOPHEN 500 MG PO TABS
1000.0000 mg | ORAL_TABLET | Freq: Two times a day (BID) | ORAL | Status: DC
  Administered 2012-02-20 – 2012-02-26 (×12): 1000 mg via ORAL
  Filled 2012-02-20 (×15): qty 2

## 2012-02-20 MED ORDER — MAGIC MOUTHWASH
5.0000 mL | Freq: Three times a day (TID) | ORAL | Status: DC | PRN
  Administered 2012-02-20 – 2012-02-22 (×2): 10 mL via ORAL
  Administered 2012-02-23: 5 mL via ORAL
  Administered 2012-02-23: 10 mL via ORAL
  Administered 2012-02-24: 5 mL via ORAL
  Filled 2012-02-20 (×6): qty 10

## 2012-02-20 MED ORDER — ACETAMINOPHEN ER 650 MG PO TBCR: 1300.0000 mg | EXTENDED_RELEASE_TABLET | Freq: Two times a day (BID) | ORAL | Status: DC

## 2012-02-20 MED ORDER — LIDOCAINE 5 % EX PTCH
1.0000 | MEDICATED_PATCH | Freq: Every day | CUTANEOUS | Status: DC | PRN
  Administered 2012-02-20: 1 via TRANSDERMAL
  Filled 2012-02-20 (×2): qty 1

## 2012-02-20 MED ORDER — SODIUM CHLORIDE 0.9 % IV BOLUS (SEPSIS)
500.0000 mL | Freq: Once | INTRAVENOUS | Status: AC
  Administered 2012-02-20: 500 mL via INTRAVENOUS

## 2012-02-20 MED ORDER — SODIUM CHLORIDE 0.9 % IV SOLN: INTRAVENOUS | Status: DC

## 2012-02-20 MED ORDER — IBUPROFEN 200 MG PO TABS
600.0000 mg | ORAL_TABLET | Freq: Once | ORAL | Status: AC
  Administered 2012-02-20: 600 mg via ORAL
  Filled 2012-02-20: qty 1

## 2012-02-20 MED ORDER — DIPHENHYDRAMINE HCL 50 MG/ML IJ SOLN
25.0000 mg | Freq: Once | INTRAMUSCULAR | Status: AC
  Administered 2012-02-20: 25 mg via INTRAVENOUS
  Filled 2012-02-20: qty 1

## 2012-02-20 MED ORDER — TRAMADOL HCL 50 MG PO TABS
50.0000 mg | ORAL_TABLET | ORAL | Status: DC | PRN
  Administered 2012-02-20 – 2012-02-26 (×19): 50 mg via ORAL
  Filled 2012-02-20 (×20): qty 1

## 2012-02-20 MED ORDER — PROPYLTHIOURACIL 50 MG PO TABS
50.0000 mg | ORAL_TABLET | Freq: Two times a day (BID) | ORAL | Status: DC
  Administered 2012-02-20 – 2012-02-26 (×12): 50 mg via ORAL
  Filled 2012-02-20 (×14): qty 1

## 2012-02-20 MED ORDER — LEVOCETIRIZINE DIHYDROCHLORIDE 5 MG PO TABS: 5.0000 mg | ORAL_TABLET | Freq: Every day | ORAL | Status: DC

## 2012-02-20 MED ORDER — MOMETASONE FURO-FORMOTEROL FUM 100-5 MCG/ACT IN AERO
1.0000 | INHALATION_SPRAY | Freq: Two times a day (BID) | RESPIRATORY_TRACT | Status: DC
  Administered 2012-02-20 – 2012-02-26 (×11): 1 via RESPIRATORY_TRACT
  Filled 2012-02-20: qty 8.8

## 2012-02-20 MED ORDER — LORATADINE 10 MG PO TABS
10.0000 mg | ORAL_TABLET | Freq: Every day | ORAL | Status: DC
  Administered 2012-02-20 – 2012-02-22 (×3): 10 mg via ORAL
  Filled 2012-02-20 (×5): qty 1

## 2012-02-20 MED ORDER — ONDANSETRON HCL 4 MG/2ML IJ SOLN
4.0000 mg | Freq: Once | INTRAMUSCULAR | Status: AC
  Administered 2012-02-20: 4 mg via INTRAVENOUS
  Filled 2012-02-20: qty 2

## 2012-02-20 MED ORDER — MONTELUKAST SODIUM 10 MG PO TABS
10.0000 mg | ORAL_TABLET | Freq: Every day | ORAL | Status: DC
  Administered 2012-02-22 – 2012-02-23 (×2): 10 mg via ORAL
  Filled 2012-02-20 (×4): qty 1

## 2012-02-20 NOTE — H&P (Signed)
[facility] History and Physical  Ebony Scott:[id number] DOB: 1950-03-29 DOA: 02/20/2012  Referring physician: ED physician PCP: Leo Grosser, MD   Chief Complaint: Syncope  HPI:  Pt is 80 female who presents to ED with main concern of new onset lightheadedness and subsequent fainting that occurred earlier today, prior to the admission. Pt denies any specific prodromal symptoms, or any similar symptoms in the past. She denies chest pain, shortness of breath, no fevers, chills, no abdominal or urinary concerns. She reports feeling well at the moment and denies any focal neurological deficits. She reports no recent changes in medications but endorses getting over recent flu like illness.   Assessment and Plan:  Syncopal event - unclear etiology at this time - will admit the pt to telemetry floor - obtain CE's, TSH, UDS, 12 lead EKG - no electrolyte abnormalities noted - PT evaluation in AM  Code Status: Full Family Communication: Pt at bedside Disposition Plan: PT evaluation   Review of Systems:  Constitutional: Negative for fever, chills and malaise/fatigue. Negative for diaphoresis.  HENT: Negative for hearing loss, ear pain, nosebleeds, congestion, sore throat, neck pain, tinnitus and ear discharge.   Eyes: Negative for blurred vision, double vision, photophobia, pain, discharge and redness.  Respiratory: Negative for cough, hemoptysis, sputum production, shortness of breath, wheezing and stridor.   Cardiovascular: Negative for chest pain, palpitations, orthopnea, claudication and leg swelling.  Gastrointestinal: Negative for nausea, vomiting and abdominal pain. Negative for heartburn, constipation, blood in stool and melena.  Genitourinary: Negative for dysuria, urgency, frequency, hematuria and flank pain.  Musculoskeletal: Negative for myalgias, back pain, joint pain and falls.  Skin: Negative for itching and rash.  Neurological: Positive for dizziness. Negative  for tingling, tremors, sensory change, speech change, focal weakness, loss of consciousness and headaches.  Endo/Heme/Allergies: Negative for environmental allergies and polydipsia. Does not bruise/bleed easily.  Psychiatric/Behavioral: Negative for suicidal ideas. The patient is not nervous/anxious.      Past Medical History  Diagnosis Date  . Chronic neck pain   . Allergic rhinitis   . Moderate persistent asthma     -FeV1 72% 2011, -IgE 102 2011, CT sinus Neg 2011  . Breast cancer     in remission  . GERD (gastroesophageal reflux disease)   . Hypothyroidism   . Osteoporosis     on reclast yearly  . Hiatal hernia   . Diverticulosis   . Hemorrhoids   . Asthma   . IBS (irritable bowel syndrome)     Past Surgical History  Procedure Date  . Cervical discectomy 2003    C3-C4  . Cervical discectomy 10/2001    C6-T  . Vesicovaginal fistula closure w/ tah 1988  . Tonsillectomy and adenoidectomy     as a child  . Nasal septum surgery 1980's  . Anterior and posterior repair 1990's  . Breast lumpectomy 1998    left  . Elbow surgery     right  . Knee surgery     left  . Hand surgery     right  . Cataract extraction 2012    left eye    Social History:  reports that she has never smoked. She has never used smokeless tobacco. She reports that she drinks about 1.2 ounces of alcohol per week. Her drug history not on file.  Allergies  Allergen Reactions  . Dust Mite Extract Other (See Comments)    Reaction unknown  . Eggs Or Egg-Derived Products   . Hydrocodone-Acetaminophen  REACTION: unspecified  . Levofloxacin     REACTION: GI upset  . Milk-Related Compounds   . Molds & Smuts   . Morphine Sulfate     REACTION: pruritis  . Other     Grass and weeds  . Oxycodone Hcl   . Paroxetine     REACTION: unspecified  . Penicillins     REACTION: rash  . Rofecoxib     REACTION: feet swelling  . Shrimp Flavor   . Soy Allergy     Limited soy d/t cancer  . Strawberry     . Tetracycline Hcl     REACTION: unspecified  . Tree Extract     Family History  Problem Relation Age of Onset  . Allergies Mother   . Allergies Father   . Heart disease Mother   . Heart disease Father   . Arthritis Mother   . Arthritis Father   . Lung cancer Mother   . Colon cancer      Maternal half uncle  . Colon cancer      Maternal half aunt  . Colitis Daughter     Prior to Admission medications   Medication Sig Start Date End Date Taking? Authorizing Provider  acetaminophen (TYLENOL ARTHRITIS PAIN) 650 MG CR tablet Take 1,300 mg by mouth 2 (two) times daily.     Yes Historical Provider, MD  Alpha-D-Galactosidase Charlyne Quale) TABS Take 1 tablet by mouth daily.    Yes Historical Provider, MD  Alpha-Lipoic Acid 600 MG CAPS Take 600 mg by mouth daily. Once a day   Yes Historical Provider, MD  Alum & Mag Hydroxide-Simeth (MAGIC MOUTHWASH) SOLN Take 5-10 mLs by mouth 3 (three) times daily as needed. For irritation 01/02/12  Yes Storm Frisk, MD  Azelastine HCl 0.15 % SOLN Place 1 spray into the nose daily. 05/04/11  Yes Storm Frisk, MD  Calcium Citrate-Vitamin D (CITRACAL MAXIMUM) 315-250 MG-UNIT TABS Take 1 tablet by mouth 2 (two) times daily. 1 capsule twice a day   Yes Historical Provider, MD  Cholecalciferol (VITAMIN D) 2000 UNITS CAPS Take 0.5 capsules by mouth at bedtime. 1/2 at bedtime   Yes Historical Provider, MD  EPINEPHrine (EPIPEN JR) 0.15 MG/0.3ML injection Inject 0.15 mg into the muscle as needed. For reaction   Yes Historical Provider, MD  gabapentin (NEURONTIN) 600 MG tablet Take 600 mg by mouth daily. Once a day   Yes Historical Provider, MD  guaiFENesin (MUCINEX) 600 MG 12 hr tablet Take 600 mg by mouth 2 (two) times daily. 11/28/11  Yes Storm Frisk, MD  lactase (LACTAID) 3000 UNITS tablet Take 3 tablets by mouth as needed. 3-4 tabs with dairy liquids and solids   Yes Historical Provider, MD  levocetirizine (XYZAL) 5 MG tablet Take 5 mg by mouth daily.    Yes Historical Provider, MD  lidocaine (LIDODERM) 5 % Place 1 patch onto the skin daily as needed. Remove & Discard patch within 12 hours or as directed by MD   Yes Historical Provider, MD  Melatonin 3 MG CAPS Take 9 mg by mouth at bedtime. 3 tabs at bedtime   Yes Historical Provider, MD  mometasone (NASONEX) 50 MCG/ACT nasal spray Place 2 sprays into the nose daily.   Yes Historical Provider, MD  mometasone-formoterol (DULERA) 100-5 MCG/ACT AERO Inhale 1 puff into the lungs 2 (two) times daily.  10/03/11  Yes Storm Frisk, MD  montelukast (SINGULAIR) 10 MG tablet Take 10 mg by mouth Daily.  08/08/11  Yes Historical Provider, MD  neomycin-bacitracin-polymyxin (TRIPLE ANTIBIOTIC) ophthalmic ointment Apply as needed    Yes Historical Provider, MD  nystatin-triamcinolone (MYCOLOG II) cream Apply 1 application topically 2 (two) times daily as needed. For irritation   Yes Historical Provider, MD  omalizumab Geoffry Paradise) 150 MG injection Inject 150 mg into the skin every 28 (twenty-eight) days. 150 SQ every 4 weeks 05/03/10  Yes Historical Provider, MD  Pancrelipase, Lip-Prot-Amyl, (CREON) 24000 UNITS CPEP Take by mouth. 1 with each meal    Yes Historical Provider, MD  Polyethyl Glycol-Propyl Glycol (SYSTANE ULTRA) 0.4-0.3 % SOLN Apply 1 drop to eye 2 (two) times daily as needed. 1-2 drops each eye every morning and at bedtime   Yes Historical Provider, MD  Prenatal Vit-Sel-Fe Fum-FA (VINATE M) 27-1 MG TABS Take 1 tablet by mouth daily.   Yes Historical Provider, MD  Probiotic Product (ALIGN) 4 MG CAPS Take 1 tablet by mouth daily. Once a day   Yes Historical Provider, MD  propylthiouracil (PTU) 50 MG tablet Take 50 mg by mouth 2 (two) times daily.    Yes Historical Provider, MD  Simethicone (GAS-X EXTRA STRENGTH) 125 MG CAPS Take 2 each by mouth daily as needed. For gas relief   Yes Historical Provider, MD  zoledronic acid (RECLAST) 5 MG/100ML SOLN Inject 5 mg into the vein once. Once a year   Yes Historical  Provider, MD    Physical Exam: Filed Vitals:   02/20/12 1430 02/20/12 1431 02/20/12 1530 02/20/12 1537  BP: 124/63  113/49 113/49  Pulse: 83  80   Temp:  98.4 F (36.9 C)    TempSrc:  Oral    Resp:    16  SpO2: 98%  93% 94%    Physical Exam  Constitutional: Appears well-developed and well-nourished. No distress.  HENT: Normocephalic. External right and left ear normal. Oropharynx is clear and moist.  Eyes: Conjunctivae and EOM are normal. PERRLA, no scleral icterus.  Neck: Normal ROM. Neck supple. No JVD. No tracheal deviation. No thyromegaly.  CVS: RRR, S1/S2 +, no murmurs, no gallops, no carotid bruit.  Pulmonary: Effort and breath sounds normal, no stridor, rhonchi, wheezes, rales.  Abdominal: Soft. BS +,  no distension, tenderness, rebound or guarding.  Musculoskeletal: Normal range of motion. No edema and no tenderness.  Lymphadenopathy: No lymphadenopathy noted, cervical, inguinal. Neuro: Alert. Normal reflexes, muscle tone coordination. No cranial nerve deficit. Skin: Skin is warm and dry. No rash noted. Not diaphoretic. No erythema. No pallor.  Psychiatric: Normal mood and affect. Behavior, judgment, thought content normal.   Labs on Admission:  Basic Metabolic Panel:  Lab 02/20/12 1610  NA 138  K 4.1  CL 102  CO2 22  GLUCOSE 90  BUN 14  CREATININE 1.02  CALCIUM 9.8  MG --  PHOS --   Liver Function Tests:  Lab 02/20/12 1152  AST 29  ALT 32  ALKPHOS 50  BILITOT 0.9  PROT 7.5  ALBUMIN 3.6   CBC:  Lab 02/20/12 1152  WBC 10.0  NEUTROABS --  HGB 11.0*  HCT 34.1*  MCV 96.9  PLT 207   Radiological Exams on Admission: Dg Chest 2 View 02/20/2012   No evidence for acute cardiopulmonary abnormality.    Dg Foot Complete Right 02/20/2012   1.  Degenerative changes.  2. No evidence for acute  abnormality.       EKG: Normal sinus rhythm, no ST/T wave changes  Debbora Presto, MD  [facility] Pager [phone]  If 7PM-7AM,  please contact  night-coverage [email or web address] Password [facility] 02/20/2012, 4:48 PM

## 2012-02-20 NOTE — ED Notes (Signed)
Pt knows that urine is needed 

## 2012-02-20 NOTE — ED Notes (Signed)
Pt arrives via EMS, with reportedly flu-like symptoms since Thursday. Had reclast shot on Friday and reports severe pain that began on Saturday. Pt c/o pain to right knee, ankle, foot and hip. States she was walking to the kitchen this AM to get pain medication and woke up in the floor. Pt does not recall event. Awake, alert, oriented at present, moving all extremities. NAD

## 2012-02-20 NOTE — ED Provider Notes (Addendum)
History     CSN: [id number]  Arrival date & time 02/20/12  1119   First MD Initiated Contact with Patient 02/20/12 1140      Chief Complaint  Patient presents with  . Loss of Consciousness    (Consider location/radiation/quality/duration/timing/severity/associated sxs/prior treatment) Patient is a 62 y.o. female presenting with syncope. The history is provided by the patient.  Loss of Consciousness Pertinent negatives include no chest pain, no abdominal pain, no headaches and no shortness of breath.  pt states had fainting episode at home today. States recent 'flu' symptoms w non productive cough, nasal congestion, achy all over. ?fevers. States symptoms present for past week. States has been eating and drinking relatively little. States this am got up to walk to kitchen, felt lightheaded/faint, ?brief loc. No palpitations or fast heart beat. No chest pain or discomfort. No unusual doe or fatigue. States compliant w meds no recent change in meds. Had reclast iv last week - states has had multiple x in past without adverse rxn. C/o right foot pain x few days. Constant dull. Mid foot. No numbness/weakness. Hx arthritis. No hx gout. Denies chills/sweats.   Past Medical History  Diagnosis Date  . Chronic neck pain   . Allergic rhinitis   . Moderate persistent asthma     -FeV1 72% 2011, -IgE 102 2011, CT sinus Neg 2011  . Breast cancer     in remission  . GERD (gastroesophageal reflux disease)   . Hypothyroidism   . Osteoporosis     on reclast yearly  . Hiatal hernia   . Diverticulosis   . Hemorrhoids   . Asthma   . IBS (irritable bowel syndrome)     Past Surgical History  Procedure Date  . Cervical discectomy 2003    C3-C4  . Cervical discectomy 10/2001    C6-T  . Vesicovaginal fistula closure w/ tah 1988  . Tonsillectomy and adenoidectomy     as a child  . Nasal septum surgery 1980's  . Anterior and posterior repair 1990's  . Breast lumpectomy 1998    left  . Elbow  surgery     right  . Knee surgery     left  . Hand surgery     right  . Cataract extraction 2012    left eye    Family History  Problem Relation Age of Onset  . Allergies Mother   . Allergies Father   . Heart disease Mother   . Heart disease Father   . Arthritis Mother   . Arthritis Father   . Lung cancer Mother   . Colon cancer      Maternal half uncle  . Colon cancer      Maternal half aunt  . Colitis Daughter     History  Substance Use Topics  . Smoking status: Never Smoker   . Smokeless tobacco: Never Used  . Alcohol Use: 1.2 oz/week    2 Glasses of wine per week    OB History    Grav Para Term Preterm Abortions TAB SAB Ect Mult Living                  Review of Systems  Constitutional: Negative for fever and chills.  HENT: Negative for neck pain and neck stiffness.   Eyes: Negative for redness and visual disturbance.  Respiratory: Positive for cough. Negative for shortness of breath.   Cardiovascular: Positive for syncope. Negative for chest pain.  Gastrointestinal: Negative for vomiting, abdominal pain and  diarrhea.  Genitourinary: Negative for dysuria and flank pain.  Musculoskeletal: Negative for back pain.  Skin: Negative for rash.  Neurological: Negative for weakness, numbness and headaches.  Hematological: Does not bruise/bleed easily.  Psychiatric/Behavioral: Negative for confusion.    Allergies  Dust mite extract; Eggs or egg-derived products; Hydrocodone-acetaminophen; Levofloxacin; Milk-related compounds; Molds & smuts; Morphine sulfate; Other; Oxycodone hcl; Paroxetine; Penicillins; Rofecoxib; Shrimp flavor; Soy allergy; Strawberry; Tetracycline hcl; and Tree extract  Home Medications   Current Outpatient Rx  Name  Route  Sig  Dispense  Refill  . ACETAMINOPHEN ER 650 MG PO TBCR   Oral   Take 1,300 mg by mouth 2 (two) times daily.           Marland Kitchen BEANO PO TABS   Oral   Take by mouth daily.          . ALPHA-LIPOIC ACID 600 MG PO  CAPS   Oral   Take by mouth. Once a day          . MAGIC MOUTHWASH   Oral   Take 5-10 mLs by mouth 3 (three) times daily as needed.   240 mL   3   . AZELASTINE HCL 0.15 % NA SOLN   Nasal   Place 1 spray into the nose daily.         Marland Kitchen CALCIUM CITRATE-VITAMIN D 315-250 MG-UNIT PO TABS   Oral   Take by mouth. 1 capsule twice a day          . VITAMIN D 2000 UNITS PO CAPS   Oral   Take by mouth. 1/2 at bedtime          . EPIPEN 2-PAK 0.3 MG/0.3ML IJ DEVI      USE AS DIRECTED   2 each   2   . ESTRACE 0.1 MG/GM VA CREA      once a week         . GABAPENTIN 600 MG PO TABS      Once a day         . GUAIFENESIN ER 600 MG PO TB12   Oral   Take 600 mg by mouth 2 (two) times daily.         Marland Kitchen HYDROCORTISONE 2.5 % RE CREA      As needed          . LACTASE 3000 UNITS PO TABS      3-4 tabs with dairy liquids and solids          . LEVOCETIRIZINE DIHYDROCHLORIDE 5 MG PO TABS   Oral   Take 5 mg by mouth daily.         Marland Kitchen LIDOCAINE 5 % EX PTCH   Transdermal   Place 1 patch onto the skin daily as needed. Remove & Discard patch within 12 hours or as directed by MD         . MELATONIN 3 MG PO CAPS   Oral   Take 9 mg by mouth. 3 tabs at bedtime         . MOMETASONE FURO-FORMOTEROL FUM 100-5 MCG/ACT IN AERO               . MONTELUKAST SODIUM 10 MG PO TABS      Daily.         . NEOMYCIN-BACITRACIN ZN-POLYMYX 5-400-10000 OP OINT      Apply as needed          . NYSTATIN-TRIAMCINOLONE 100000-0.1 UNIT/GM-% EX CREA  Apply as needed          . OMALIZUMAB 150 MG [place] SOLR      150 SQ every 4 weeks         . PANCRELIPASE (LIP-PROT-AMYL) 24000 UNITS PO CPEP   Oral   Take by mouth. 1 with each meal          . POLYETHYL GLYCOL-PROPYL GLYCOL 0.4-0.3 % OP SOLN   Ophthalmic   Apply to eye. 1-2 drops each eye every morning and at bedtime          . VINATE M 27-1 MG PO TABS   Oral   Take 1 tablet by mouth daily.         Marland Kitchen ALIGN  4 MG PO CAPS   Oral   Take by mouth. Once a day          . PROPYLTHIOURACIL 50 MG PO TABS      1 tablet twice a day          . SIMETHICONE 125 MG PO CAPS   Oral   Take by mouth. Per box          . ZOLEDRONIC ACID 5 MG/100ML IV SOLN      Once a year            BP 105/69  Temp 98.9 F (37.2 C) (Oral)  Resp 16  SpO2 100%  Physical Exam  Nursing note and vitals reviewed. Constitutional: She is oriented to person, place, and time. She appears well-developed and well-nourished. No distress.  HENT:  Head: Atraumatic.  Nose: Nose normal.  Mouth/Throat: Oropharynx is clear and moist.  Eyes: Conjunctivae normal are normal. Pupils are equal, round, and reactive to light. No scleral icterus.  Neck: Neck supple. No tracheal deviation present.       No bruit  Cardiovascular: Normal rate, regular rhythm, normal heart sounds and intact distal pulses.   Pulmonary/Chest: Effort normal and breath sounds normal. No respiratory distress. She exhibits no tenderness.  Abdominal: Soft. Normal appearance and bowel sounds are normal. She exhibits no distension and no mass. There is no tenderness. There is no rebound and no guarding.  Genitourinary:       No cva tenderness  Musculoskeletal: She exhibits no edema and no tenderness.       Mild inc warmth and tenderness mid right foot. Skin intact. Dp/pt palp. Normal cap refill in toes. Good rom bil extremities, no other focal bony tenderness. CTLS spine, non tender, aligned, no step off.   Neurological: She is alert and oriented to person, place, and time.       Motor intact bil.   Skin: Skin is warm and dry. No rash noted. She is not diaphoretic.  Psychiatric: She has a normal mood and affect.    ED Course  Procedures (including critical care time)  Results for orders placed during the hospital encounter of 02/20/12  CBC      Component Value Range   WBC 10.0  4.0 - 10.5 K/uL   RBC 3.52 (*) 3.87 - 5.11 MIL/uL   Hemoglobin 11.0 (*)  12.0 - 15.0 g/dL   HCT 40.9 (*) 81.1 - 91.4 %   MCV 96.9  78.0 - 100.0 fL   MCH 31.3  26.0 - 34.0 pg   MCHC 32.3  30.0 - 36.0 g/dL   RDW 78.2  95.6 - 21.3 %   Platelets 207  150 - 400 K/uL  COMPREHENSIVE METABOLIC PANEL  Component Value Range   Sodium 138  135 - 145 mEq/L   Potassium 4.1  3.5 - 5.1 mEq/L   Chloride 102  96 - 112 mEq/L   CO2 22  19 - 32 mEq/L   Glucose, Bld 90  70 - 99 mg/dL   BUN 14  6 - 23 mg/dL   Creatinine, Ser 7.82  0.50 - 1.10 mg/dL   Calcium 9.8  8.4 - 95.6 mg/dL   Total Protein 7.5  6.0 - 8.3 g/dL   Albumin 3.6  3.5 - 5.2 g/dL   AST 29  0 - 37 U/L   ALT 32  0 - 35 U/L   Alkaline Phosphatase 50  39 - 117 U/L   Total Bilirubin 0.9  0.3 - 1.2 mg/dL   GFR calc non Af Amer 57 (*) >90 mL/min   GFR calc Af Amer 66 (*) >90 mL/min  URINALYSIS, ROUTINE W REFLEX MICROSCOPIC      Component Value Range   Color, Urine YELLOW  YELLOW   APPearance CLEAR  CLEAR   Specific Gravity, Urine 1.018  1.005 - 1.030   pH 7.0  5.0 - 8.0   Glucose, UA NEGATIVE  NEGATIVE mg/dL   Hgb urine dipstick NEGATIVE  NEGATIVE   Bilirubin Urine NEGATIVE  NEGATIVE   Ketones, ur 15 (*) NEGATIVE mg/dL   Protein, ur NEGATIVE  NEGATIVE mg/dL   Urobilinogen, UA 0.2  0.0 - 1.0 mg/dL   Nitrite NEGATIVE  NEGATIVE   Leukocytes, UA MODERATE (*) NEGATIVE  URINE MICROSCOPIC-ADD ON      Component Value Range   Squamous Epithelial / LPF MANY (*) RARE   WBC, UA 3-6  <3 WBC/hpf   Dg Chest 2 View  02/20/2012  *RADIOLOGY REPORT*  Clinical Data: Cough and chills.  Pain down right leg and right foot.  History of breast cancer.  Symptoms began on Friday night.  CHEST - 2 VIEW  Comparison: 01/25/2012  Findings: Cardiomediastinal silhouette is within normal limits. The lungs are free of focal consolidations and pleural effusions. No pulmonary edema.  Surgical clips overlie the left anterior chest wall/breast.  Surgical clips are identified in the upper abdomen. Visualized osseous structures have a normal  appearance.  IMPRESSION: No evidence for acute cardiopulmonary abnormality.   Original Report Authenticated By: Norva Pavlov, M.D.    Dg Chest 2 View  01/25/2012  *RADIOLOGY REPORT*  Clinical Data: Cough, chest heaviness, dizziness, asthma, question pneumonia  CHEST - 2 VIEW  Comparison: 11/28/2011  Findings: Normal heart size, mediastinal contours, and pulmonary vascularity. Atherosclerotic calcification aorta. Lungs appear slightly hyperinflated with minimal peribronchial thickening likely related to patient's history of asthma. No pulmonary infiltrate, pleural effusion or pneumothorax. Surgical clips left breast. Bones appear demineralized. Prior cervical spine surgery.  IMPRESSION: No acute abnormalities.   Original Report Authenticated By: Ulyses Southward, M.D.    Dg Foot Complete Right  02/20/2012  *RADIOLOGY REPORT*  Clinical Data: Pain in the right leg and foot.  History of breast cancer.  Chills.  RIGHT FOOT COMPLETE - 3+ VIEW  Comparison: 08/27/2011  Findings: There is no evidence for acute fracture or dislocation. No soft tissue foreign body or gas identified.  There is mild degenerative change in the hind foot at the first tarsometatarsal joint.  Small plantar are and Achilles spurs are identified.  IMPRESSION:  1.  Degenerative changes. 2. No evidence for acute  abnormality.   Original Report Authenticated By: Norva Pavlov, M.D.  MDM  Iv ns bolus. Dilaudid 1 mg iv for pain. zofran iv.  Reviewed nursing notes and prior charts for additional history.   Labs.   Date: 02/20/2012  Rate: 78  Rhythm: normal sinus rhythm  QRS Axis: normal  Intervals: normal  ST/T Wave abnormalities: normal  Conduction Disutrbances:none  Narrative Interpretation:   Old EKG Reviewed: unchanged  Pt denies any previous adverse rxn/acute phase rxn to reclast.   Discussed allergies w pt, pt states she can take nsaids.  ?inflamm arthritis right foot.   Motrin po.  Pt requests additional pain  meds, dilaudid 1 mg iv.    Vitals normal. Flu neg. cxr neg.   Discussed labs w pt. Pt requests additional ivf, 1 liter ns.   Requests medication for itching. No rash or hives noted. No wheezing. Benadryl iv.   Given syncope will admit for obs.   [facility] 8 tele     Suzi Roots, MD 02/20/12 1559  Suzi Roots, MD 02/20/12 [phone]

## 2012-02-21 ENCOUNTER — Observation Stay (HOSPITAL_COMMUNITY): Payer: Medicare Other

## 2012-02-21 DIAGNOSIS — M79605 Pain in left leg: Secondary | ICD-10-CM | POA: Diagnosis present

## 2012-02-21 DIAGNOSIS — R55 Syncope and collapse: Secondary | ICD-10-CM | POA: Diagnosis present

## 2012-02-21 LAB — URIC ACID: Uric Acid, Serum: 3.5 mg/dL (ref 2.4–7.0)

## 2012-02-21 LAB — BASIC METABOLIC PANEL
BUN: 16 mg/dL (ref 6–23)
CO2: 22 mEq/L (ref 19–32)
Calcium: 8.4 mg/dL (ref 8.4–10.5)
Chloride: 100 mEq/L (ref 96–112)
Creatinine, Ser: 1.04 mg/dL (ref 0.50–1.10)
GFR calc Af Amer: 65 mL/min — ABNORMAL LOW (ref >90)
GFR calc non Af Amer: 56 mL/min — ABNORMAL LOW (ref >90)
Glucose, Bld: 118 mg/dL — ABNORMAL HIGH (ref 70–99)
Potassium: 3.7 mEq/L (ref 3.5–5.1)
Sodium: 133 mEq/L — ABNORMAL LOW (ref 135–145)

## 2012-02-21 LAB — CBC
HCT: 30.5 % — ABNORMAL LOW (ref 36.0–46.0)
Hemoglobin: 10.2 g/dL — ABNORMAL LOW (ref 12.0–15.0)
MCH: 32.5 pg (ref 26.0–34.0)
MCHC: 33.4 g/dL (ref 30.0–36.0)
MCV: 97.1 fL (ref 78.0–100.0)
Platelets: 189 10*3/uL (ref 150–400)
RBC: 3.14 MIL/uL — ABNORMAL LOW (ref 3.87–5.11)
RDW: 13.2 % (ref 11.5–15.5)
WBC: 9.1 10*3/uL (ref 4.0–10.5)

## 2012-02-21 LAB — T4, FREE: Free T4: 1.02 ng/dL (ref 0.80–1.80)

## 2012-02-21 LAB — INFLUENZA PANEL BY PCR (TYPE A & B)
H1N1 flu by pcr: NOT DETECTED
Influenza A By PCR: NEGATIVE
Influenza B By PCR: NEGATIVE

## 2012-02-21 LAB — TSH: TSH: 5.295 u[IU]/mL — ABNORMAL HIGH (ref 0.350–4.500)

## 2012-02-21 LAB — TROPONIN I
Troponin I: <0.3 ng/mL (ref <0.30)
Troponin I: <0.3 ng/mL (ref <0.30)

## 2012-02-21 MED ORDER — ENOXAPARIN SODIUM 40 MG/0.4ML ~~LOC~~ SOLN
40.0000 mg | Freq: Every day | SUBCUTANEOUS | Status: DC
  Administered 2012-02-21 – 2012-02-22 (×2): 40 mg via SUBCUTANEOUS
  Filled 2012-02-21 (×4): qty 0.4

## 2012-02-21 MED ORDER — ACETAMINOPHEN 325 MG PO TABS
650.0000 mg | ORAL_TABLET | Freq: Four times a day (QID) | ORAL | Status: DC | PRN
  Administered 2012-02-22 – 2012-02-23 (×2): 650 mg via ORAL
  Filled 2012-02-21 (×2): qty 2

## 2012-02-21 MED ORDER — ENSURE COMPLETE PO LIQD
237.0000 mL | Freq: Every day | ORAL | Status: DC
  Administered 2012-02-21 – 2012-02-23 (×3): 237 mL via ORAL

## 2012-02-21 NOTE — Progress Notes (Signed)
INITIAL NUTRITION ASSESSMENT  DOCUMENTATION CODES Per approved criteria  -Not Applicable   INTERVENTION:  Ensure Complete daily (350 kcals, 13 gm protein per 8 fl oz bottle) RD to follow for nutrition care plan  NUTRITION DIAGNOSIS: Inadequate oral intake related to poor appetite as evidenced by patient report  Goal: Oral intake with meals & supplements to meet >/= 90% of estimated nutrition needs  Monitor:  PO & supplemental intake, weight, labs, I/O's  Reason for Assessment: Malnutrition Screening Tool Report  62 y.o. female  Admitting Dx: syncope  ASSESSMENT: Patient presented to ED with main concern of new onset lightheadedness and subsequent fainting; reports her appetite is poor; PTA had some nausea & vomiting; very little consumed per breakfast tray observation; no recent weight loss reported; amenable to Ensure Complete daily -- RD to order.  Height: Ht Readings from Last 1 Encounters:  02/20/12 5\' 1"  (1.549 m)    Weight: Wt Readings from Last 1 Encounters:  02/20/12 105 lb 12.8 oz (47.991 kg)    Ideal Body Weight: 105 lb  % Ideal Body Weight: 100%  Wt Readings from Last 10 Encounters:  02/20/12 105 lb 12.8 oz (47.991 kg)  02/15/12 110 lb (49.896 kg)  01/25/12 110 lb 3.2 oz (49.986 kg)  01/02/12 110 lb (49.896 kg)  11/28/11 107 lb 12.8 oz (48.898 kg)  08/22/11 107 lb 12.8 oz (48.898 kg)  06/21/11 106 lb (48.081 kg)  06/05/11 108 lb (48.988 kg)  05/29/11 104 lb (47.174 kg)  05/04/11 103 lb 12.8 oz (47.083 kg)    Usual Body Weight: 110 lb  % Usual Body Weight: 95%  BMI:  Body mass index is 19.99 kg/(m^2).  Estimated Nutritional Needs: Kcal: 1300-1500 Protein: 65-75 gm Fluid: > 1.5 L  Skin: Intact  Diet Order: General  EDUCATION NEEDS: -No education needs identified at this time   Intake/Output Summary (Last 24 hours) at 02/21/12 1058 Last data filed at 02/21/12 0800  Gross per 24 hour  Intake    720 ml  Output    350 ml  Net    370  ml    Last BM: 1/1  Labs:   Lab 02/21/12 0208 02/20/12 1152  NA 133* 138  K 3.7 4.1  CL 100 102  CO2 22 22  BUN 16 14  CREATININE 1.04 1.02  CALCIUM 8.4 9.8  MG -- --  PHOS -- --  GLUCOSE 118* 90    Scheduled Meds:   . acetaminophen  1,000 mg Oral BID  . azelastine  1 spray Each Nare Daily  . enoxaparin (LOVENOX) injection  30 mg Subcutaneous Q24H  . fluticasone  2 spray Each Nare Daily  . gabapentin  600 mg Oral Daily  . guaiFENesin  600 mg Oral BID  . lipase/protease/amylase  1 capsule Oral TID AC  . loratadine  10 mg Oral Daily  . mometasone-formoterol  1 puff Inhalation BID  . montelukast  10 mg Oral QHS  . multivitamin with minerals  1 tablet Oral Daily  . propylthiouracil  50 mg Oral BID  . sodium chloride  3 mL Intravenous Q12H    Continuous Infusions:   Past Medical History  Diagnosis Date  . Chronic neck pain   . Allergic rhinitis   . Moderate persistent asthma     -FeV1 72% 2011, -IgE 102 2011, CT sinus Neg 2011  . GERD (gastroesophageal reflux disease)   . Hypothyroidism   . Osteoporosis     on reclast yearly  . Hiatal  hernia   . Diverticulosis   . Hemorrhoids   . Asthma   . IBS (irritable bowel syndrome)   . Complication of anesthesia     "had hard time waking up from it several times" (02/20/2012)  . Hypercholesteremia   . Pneumonia 04/2011; ~ 11/2011    "double; single" (02/20/2012)  . Exertional dyspnea   . Graves disease   . Headache     "related to allergies; more at different times during the year" (02/20/2012)  . Arthritis     "mostly the hands" (02/20/2012)  . Fibromyalgia 11/2011  . Chronic lower back pain   . Depression     "some; don't take anything for it" (02/20/2012)  . Breast cancer 1998    in remission    Past Surgical History  Procedure Date  . Cervical fusion 2003    C3-C4  . Cervical discectomy 10/2001    C5-C6  . Vesicovaginal fistula closure w/ tah 1988  . Nasal septum surgery 1980's  . Anterior and posterior  repair 1990's  . Breast lumpectomy 1998    left  . Debridement tennis elbow ?1970's    right  . Knee arthroplasty ?1990's    "?right; w/cartilage repair" (02/20/2012)  . Carpometacarpel ([facility]) fusion of thumb with autograft from radius ~ 2009    "both thumbs" (02/20/2012)  . Cataract extraction w/ intraocular lens  implant, bilateral 2012  . Tonsillectomy ~ 1953  . Posterior cervical fusion/foraminotomy 2004    "failed initial fusion; rewired  anterior neck" (02/20/2012)    Kirkland Hun, RD, LDN Pager #: [phone] After-Hours Pager #: [phone]

## 2012-02-21 NOTE — Progress Notes (Signed)
[facility] PROGRESS NOTE  Ebony Scott KYH:[id number] DOB: 03/07/1950 DOA: 02/20/2012 PCP: Leo Grosser, MD  Assessment/Plan: Syncopal event  - Unclear etiology at this time  - No events on telemetry.  - Request 2-D echocardiogram.  - PT evaluation pending.  Right knee and ankle pain - X-ray of the right foot negative. - Will get x-ray of the right knee. - Suspicious for possible gout.  Check uric acid.   Fever - Etiology unclear. - Send for blood cultures x2. - Influenza PCR negative on 02/20/2012, strong clinical suspicion patient likely has upper respiratory viral illness. - Patient had a therapeutic window for Tamiflu.  If patient is hemodynamically unstable consider starting Tamiflu.  Abnormal TSH Check free T4.  Anemia Check anemia panel in the morning.  Hyponatremia Continue to monitor.  Code Status: Full code Family Communication: Patient updated, no family at bedside. Disposition Plan: Pending.   Consultants:  None.  Procedures:  None.  Antibiotics:  None.  HPI/Subjective: Complaining of right knee and ankle pain.  Objective: Filed Vitals:   02/20/12 2016 02/20/12 2035 02/21/12 0500 02/21/12 0817  BP: 123/62  118/60   Pulse: 80  80   Temp: 98.5 F (36.9 C)  99.5 F (37.5 C)   TempSrc: Oral  Oral   Resp: 17  18   Height:      Weight:      SpO2: 99% 91% 99% 98%    Intake/Output Summary (Last 24 hours) at 02/21/12 1319 Last data filed at 02/21/12 0800  Gross per 24 hour  Intake    720 ml  Output    350 ml  Net    370 ml   Filed Weights   02/20/12 1724  Weight: 47.991 kg (105 lb 12.8 oz)    Exam: Physical Exam: General: Awake, Oriented, No acute distress. HEENT: EOMI. Neck: Supple CV: S1 and S2 Lungs: Clear to ascultation bilaterally Abdomen: Soft, Nontender, Nondistended, +bowel sounds. Ext: Good pulses. Trace edema.  Right knee and ankle warm to touch.  No erythema noted.  Data Reviewed: Basic Metabolic  Panel:  Lab 02/21/12 0208 02/20/12 1152  NA 133* 138  K 3.7 4.1  CL 100 102  CO2 22 22  GLUCOSE 118* 90  BUN 16 14  CREATININE 1.04 1.02  CALCIUM 8.4 9.8  MG -- --  PHOS -- --   Liver Function Tests:  Lab 02/20/12 1152  AST 29  ALT 32  ALKPHOS 50  BILITOT 0.9  PROT 7.5  ALBUMIN 3.6   No results found for this basename: LIPASE:5,AMYLASE:5 in the last 168 hours No results found for this basename: AMMONIA:5 in the last 168 hours CBC:  Lab 02/21/12 0208 02/20/12 1152  WBC 9.1 10.0  NEUTROABS -- --  HGB 10.2* 11.0*  HCT 30.5* 34.1*  MCV 97.1 96.9  PLT 189 207   Cardiac Enzymes:  Lab 02/21/12 0208 02/20/12 2013  CKTOTAL -- --  CKMB -- --  CKMBINDEX -- --  TROPONINI <0.30 <0.30   BNP (last 3 results) No results found for this basename: PROBNP:3 in the last 8760 hours CBG: No results found for this basename: GLUCAP:5 in the last 168 hours  No results found for this or any previous visit (from the past 240 hour(s)).   Studies: Dg Chest 2 View  02/20/2012  *RADIOLOGY REPORT*  Clinical Data: Cough and chills.  Pain down right leg and right foot.  History of breast cancer.  Symptoms began on Friday night.  CHEST -  2 VIEW  Comparison: 01/25/2012  Findings: Cardiomediastinal silhouette is within normal limits. The lungs are free of focal consolidations and pleural effusions. No pulmonary edema.  Surgical clips overlie the left anterior chest wall/breast.  Surgical clips are identified in the upper abdomen. Visualized osseous structures have a normal appearance.  IMPRESSION: No evidence for acute cardiopulmonary abnormality.   Original Report Authenticated By: Norva Pavlov, M.D.    Dg Foot Complete Right  02/20/2012  *RADIOLOGY REPORT*  Clinical Data: Pain in the right leg and foot.  History of breast cancer.  Chills.  RIGHT FOOT COMPLETE - 3+ VIEW  Comparison: 08/27/2011  Findings: There is no evidence for acute fracture or dislocation. No soft tissue foreign body or gas  identified.  There is mild degenerative change in the hind foot at the first tarsometatarsal joint.  Small plantar are and Achilles spurs are identified.  IMPRESSION:  1.  Degenerative changes. 2. No evidence for acute  abnormality.   Original Report Authenticated By: Norva Pavlov, M.D.     Scheduled Meds:   . acetaminophen  1,000 mg Oral BID  . azelastine  1 spray Each Nare Daily  . enoxaparin (LOVENOX) injection  40 mg Subcutaneous QHS  . feeding supplement  237 mL Oral Q1500  . fluticasone  2 spray Each Nare Daily  . gabapentin  600 mg Oral Daily  . guaiFENesin  600 mg Oral BID  . lipase/protease/amylase  1 capsule Oral TID AC  . loratadine  10 mg Oral Daily  . mometasone-formoterol  1 puff Inhalation BID  . montelukast  10 mg Oral QHS  . multivitamin with minerals  1 tablet Oral Daily  . propylthiouracil  50 mg Oral BID  . sodium chloride  3 mL Intravenous Q12H   Continuous Infusions:   Principal Problem:  *Syncope Active Problems:  HYPOTHYROIDISM  HYPERLIPIDEMIA  GERD  Left leg pain    Time spent: 25 mins    Auden Tatar A  [facility] Pager [phone]. If 8PM-8AM, please contact night-coverage at [email or web address], password [facility] 02/21/2012, 1:19 PM  LOS: 1 day

## 2012-02-22 DIAGNOSIS — K589 Irritable bowel syndrome without diarrhea: Secondary | ICD-10-CM

## 2012-02-22 DIAGNOSIS — M79609 Pain in unspecified limb: Secondary | ICD-10-CM

## 2012-02-22 DIAGNOSIS — E039 Hypothyroidism, unspecified: Secondary | ICD-10-CM

## 2012-02-22 DIAGNOSIS — E785 Hyperlipidemia, unspecified: Secondary | ICD-10-CM

## 2012-02-22 DIAGNOSIS — I82409 Acute embolism and thrombosis of unspecified deep veins of unspecified lower extremity: Secondary | ICD-10-CM

## 2012-02-22 DIAGNOSIS — I517 Cardiomegaly: Secondary | ICD-10-CM

## 2012-02-22 DIAGNOSIS — J069 Acute upper respiratory infection, unspecified: Secondary | ICD-10-CM

## 2012-02-22 LAB — BASIC METABOLIC PANEL
BUN: 11 mg/dL (ref 6–23)
CO2: 23 mEq/L (ref 19–32)
Calcium: 8.7 mg/dL (ref 8.4–10.5)
Chloride: 98 mEq/L (ref 96–112)
Creatinine, Ser: 1.1 mg/dL (ref 0.50–1.10)
GFR calc Af Amer: 61 mL/min — ABNORMAL LOW (ref >90)
GFR calc non Af Amer: 52 mL/min — ABNORMAL LOW (ref >90)
Glucose, Bld: 102 mg/dL — ABNORMAL HIGH (ref 70–99)
Potassium: 3.9 mEq/L (ref 3.5–5.1)
Sodium: 134 mEq/L — ABNORMAL LOW (ref 135–145)

## 2012-02-22 LAB — CBC
HCT: 31.6 % — ABNORMAL LOW (ref 36.0–46.0)
Hemoglobin: 10.5 g/dL — ABNORMAL LOW (ref 12.0–15.0)
MCH: 32 pg (ref 26.0–34.0)
MCHC: 33.2 g/dL (ref 30.0–36.0)
MCV: 96.3 fL (ref 78.0–100.0)
Platelets: 197 10*3/uL (ref 150–400)
RBC: 3.28 MIL/uL — ABNORMAL LOW (ref 3.87–5.11)
RDW: 13.1 % (ref 11.5–15.5)
WBC: 10.9 10*3/uL — ABNORMAL HIGH (ref 4.0–10.5)

## 2012-02-22 LAB — IRON AND TIBC
Iron: <10 ug/dL — ABNORMAL LOW (ref 42–135)
UIBC: 241 ug/dL (ref 125–400)

## 2012-02-22 LAB — SYNOVIAL CELL COUNT + DIFF, W/ CRYSTALS
Lymphocytes-Synovial Fld: 1 % (ref 0–20)
Monocyte-Macrophage-Synovial Fluid: 25 % — ABNORMAL LOW (ref 50–90)
Neutrophil, Synovial: 74 % — ABNORMAL HIGH (ref 0–25)
WBC, Synovial: 42500 /mm3 — ABNORMAL HIGH (ref 0–200)

## 2012-02-22 LAB — URINALYSIS, ROUTINE W REFLEX MICROSCOPIC
Bilirubin Urine: NEGATIVE
Glucose, UA: NEGATIVE mg/dL
Hgb urine dipstick: NEGATIVE
Ketones, ur: NEGATIVE mg/dL
Leukocytes, UA: NEGATIVE
Nitrite: NEGATIVE
Protein, ur: 100 mg/dL — AB
Specific Gravity, Urine: 1.019 (ref 1.005–1.030)
Urobilinogen, UA: 1 mg/dL (ref 0.0–1.0)
pH: 6.5 (ref 5.0–8.0)

## 2012-02-22 LAB — RETICULOCYTES
RBC.: 3.28 MIL/uL — ABNORMAL LOW (ref 3.87–5.11)
Retic Count, Absolute: 26.2 10*3/uL (ref 19.0–186.0)
Retic Ct Pct: 0.8 % (ref 0.4–3.1)

## 2012-02-22 LAB — VITAMIN B12: Vitamin B-12: 648 pg/mL (ref 211–911)

## 2012-02-22 LAB — URINE MICROSCOPIC-ADD ON

## 2012-02-22 LAB — FOLATE: Folate: >20 ng/mL

## 2012-02-22 LAB — FERRITIN: Ferritin: 289 ng/mL (ref 10–291)

## 2012-02-22 LAB — SEDIMENTATION RATE: Sed Rate: 130 mm/hr — ABNORMAL HIGH (ref 0–22)

## 2012-02-22 MED ORDER — LIDOCAINE HCL 1 % IJ SOLN
20.0000 mL | Freq: Once | INTRAMUSCULAR | Status: AC
  Administered 2012-02-23: 20 mL
  Filled 2012-02-22: qty 20

## 2012-02-22 MED ORDER — TRIAMCINOLONE ACETONIDE 40 MG/ML IJ SUSP
40.0000 mg | Freq: Once | INTRAMUSCULAR | Status: AC
  Administered 2012-02-23: 40 mg via INTRA_ARTICULAR
  Filled 2012-02-22: qty 1

## 2012-02-22 MED ORDER — FENTANYL CITRATE 0.05 MG/ML IJ SOLN
25.0000 ug | INTRAMUSCULAR | Status: DC | PRN
  Administered 2012-02-22 – 2012-02-23 (×4): 25 ug via INTRAVENOUS
  Filled 2012-02-22 (×5): qty 2

## 2012-02-22 MED ORDER — LIDOCAINE HCL (PF) 1 % IJ SOLN
INTRAMUSCULAR | Status: AC
  Filled 2012-02-22: qty 5

## 2012-02-22 MED ORDER — LIDOCAINE HCL (PF) 1 % IJ SOLN
30.0000 mL | Freq: Once | INTRAMUSCULAR | Status: AC
  Administered 2012-02-22: 30 mL
  Filled 2012-02-22: qty 30

## 2012-02-22 NOTE — Evaluation (Signed)
Physical Therapy Evaluation Patient Details Name: Ebony Scott MRN: [id number] DOB: [birth year] Today's Date: 02/22/2012 Time: 4098-1191 PT Time Calculation (min): 20 min  PT Assessment / Plan / Recommendation Clinical Impression  Pt admitted s/p syncopal episode as well as RLE pain. X-rays negative thus far, possible gout infection? Evaluation limited secondary to patient with 10/10 pain with all LE weightbearing. Pt will benefit from skilled PT in the acute care setting in order to maximize functional mobility prior to d/c    PT Assessment  Patient needs continued PT services    Follow Up Recommendations  Other (comment) (TBD)    Does the patient have the potential to tolerate intense rehabilitation      Barriers to Discharge        Equipment Recommendations  Other (comment) (TBD)    Recommendations for Other Services     Frequency Min 3X/week    Precautions / Restrictions Precautions Precautions: Fall Restrictions Weight Bearing Restrictions: No   Pertinent Vitals/Pain 10/10 pain with LE weightbearing. RN aware      Mobility  Bed Mobility Bed Mobility: Supine to Sit;Sitting - Scoot to Edge of Bed Supine to Sit: 4: Min assist Sitting - Scoot to [place] of Bed: 4: Min guard Details for Bed Mobility Assistance: Min assist with RLE out of bed secondary to pain Transfers Transfers: Sit to Stand;Stand to Sit;Stand Pivot Transfers Sit to Stand: 2: Max assist;With upper extremity assist;From bed Stand to Sit: 2: Max assist;With upper extremity assist;To chair/3-in-1 Stand Pivot Transfers: 2: Max assist;With armrests Details for Transfer Assistance: Assist for support as pt unable to tolerate much weightbearing through LEs secondary to pain. Cues for hand placement and technique. Pt with good UE strength for transfer Ambulation/Gait Ambulation/Gait Assistance: Not tested (comment)    Shoulder Instructions     Exercises     PT Diagnosis: Acute pain;Difficulty walking  PT  Problem List: Decreased activity tolerance;Decreased mobility;Decreased knowledge of use of DME;Decreased safety awareness;Decreased knowledge of precautions;Pain PT Treatment Interventions: DME instruction;Gait training;Stair training;Functional mobility training;Patient/family education   PT Goals Acute Rehab PT Goals PT Goal Formulation: With patient Time For Goal Achievement: 03/07/12 Potential to Achieve Goals: Fair Pt will go Sit to Stand: with modified independence PT Goal: Sit to Stand - Progress: Goal set today Pt will go Stand to Sit: with modified independence PT Goal: Stand to Sit - Progress: Goal set today Pt will Transfer Bed to Chair/Chair to Bed: with supervision PT Transfer Goal: Bed to Chair/Chair to Bed - Progress: Goal set today Pt will Ambulate: 51 - 150 feet;with supervision;with least restrictive assistive device PT Goal: Ambulate - Progress: Goal set today Pt will Go Up / Down Stairs: Flight;with supervision;with rail(s) PT Goal: Up/Down Stairs - Progress: Goal set today  Visit Information  Last PT Received On: 02/22/12 Assistance Needed: +2    Subjective Data  Patient Stated Goal: to decrease leg pain   Prior Functioning  Home Living Lives With: Alone Available Help at Discharge: Family;Other (Comment) (son works at night) Type of Home: House Home Access: Stairs to enter Secretary/administrator of Steps: 8 Entrance Stairs-Rails: Right;Left Home Layout: Two level Alternate Level Stairs-Number of Steps: 12 Alternate Level Stairs-Rails: Right Bathroom Shower/Tub: Tub/shower unit;Curtain Bathroom Toilet: Handicapped height Bathroom Accessibility: Yes How Accessible: Accessible via walker Home Adaptive Equipment: Grab bars in shower Prior Function Level of Independence: Independent Able to Take Stairs?: Yes Driving: Yes Vocation: Retired Musician: No difficulties Dominant Hand: Right    Cognition  Overall  Cognitive Status:  Appears within functional limits for tasks assessed/performed Arousal/Alertness: Awake/alert Orientation Level: Appears intact for tasks assessed Behavior During Session: [facility] for tasks performed    Extremity/Trunk Assessment Right Lower Extremity Assessment RLE ROM/Strength/Tone: Unable to fully assess;Due to pain Left Lower Extremity Assessment LLE ROM/Strength/Tone: Unable to fully assess;Due to pain   Balance    End of Session PT - End of Session Equipment Utilized During Treatment: Gait belt Activity Tolerance: Patient limited by pain Patient left: in chair;with call bell/phone within reach;with nursing in room Nurse Communication: Mobility status  GP Functional Assessment Tool Used: clinical judgement Functional Limitation: Mobility: Walking and moving around Mobility: Walking and Moving Around Current Status ([id number]): At least 60 percent but less than 80 percent impaired, limited or restricted Mobility: Walking and Moving Around Goal Status [phone]): At least 1 percent but less than 20 percent impaired, limited or restricted   Milana Kidney 02/22/2012, 9:56 AM  02/22/2012 Milana Kidney DPT PAGER: [phone] OFFICE: [phone]

## 2012-02-22 NOTE — Consult Note (Signed)
Reason for Consult:Evaluate R knee swelling  Referring Physician: Dr. Idamae Lusher AAVA DELAND is an 62 y.o. female.  HPI: 3-4 day h/o r knee pain and swelling. Started in R ankle after reclast injection 1 wk ago.  Now with fevers/chills.  No h/o gout or pseudogout.  L knee also swollen and painful.  No recent cuts scrapes etc.  No other recent infection.  Past Medical History  Diagnosis Date  . Chronic neck pain   . Allergic rhinitis   . Moderate persistent asthma     -FeV1 72% 2011, -IgE 102 2011, CT sinus Neg 2011  . GERD (gastroesophageal reflux disease)   . Hypothyroidism   . Osteoporosis     on reclast yearly  . Hiatal hernia   . Diverticulosis   . Hemorrhoids   . Asthma   . IBS (irritable bowel syndrome)   . Complication of anesthesia     "had hard time waking up from it several times" (02/20/2012)  . Hypercholesteremia   . Pneumonia 04/2011; ~ 11/2011    "double; single" (02/20/2012)  . Exertional dyspnea   . Graves disease   . Headache     "related to allergies; more at different times during the year" (02/20/2012)  . Arthritis     "mostly the hands" (02/20/2012)  . Fibromyalgia 11/2011  . Chronic lower back pain   . Depression     "some; don't take anything for it" (02/20/2012)  . Breast cancer 1998    in remission    Past Surgical History  Procedure Date  . Cervical fusion 2003    C3-C4  . Cervical discectomy 10/2001    C5-C6  . Vesicovaginal fistula closure w/ tah 1988  . Nasal septum surgery 1980's  . Anterior and posterior repair 1990's  . Breast lumpectomy 1998    left  . Debridement tennis elbow ?1970's    right  . Knee arthroplasty ?1990's    "?right; w/cartilage repair" (02/20/2012)  . Carpometacarpel ([facility]) fusion of thumb with autograft from radius ~ 2009    "both thumbs" (02/20/2012)  . Cataract extraction w/ intraocular lens  implant, bilateral 2012  . Tonsillectomy ~ 1953  . Posterior cervical fusion/foraminotomy 2004    "failed initial fusion; rewired   anterior neck" (02/20/2012)    Family History  Problem Relation Age of Onset  . Allergies Mother   . Allergies Father   . Heart disease Mother   . Heart disease Father   . Arthritis Mother   . Arthritis Father   . Lung cancer Mother   . Colon cancer      Maternal half uncle  . Colon cancer      Maternal half aunt  . Colitis Daughter     Social History:  reports that she has never smoked. She has never used smokeless tobacco. She reports that she drinks alcohol. She reports that she does not use illicit drugs.  Allergies:  Allergies  Allergen Reactions  . Dust Mite Extract Shortness Of Breath and Other (See Comments)    "sneezing" (02/20/2012)  . Molds & Smuts Shortness Of Breath  . Morphine Sulfate Itching  . Other Shortness Of Breath and Other (See Comments)    Grass and weeds "sneezing; filled sinuses" (02/20/2012)  . Penicillins Rash and Other (See Comments)    "welts" (02/20/2012)  . Rofecoxib Swelling    Celebrex REACTION: feet swelling  . Shrimp Flavor Anaphylaxis    ALL SHELLFISH  . Tetracycline Hcl Nausea And Vomiting  .  Dilaudid (Hydromorphone Hcl) Itching  . Hydrocodone-Acetaminophen Nausea And Vomiting  . Levofloxacin Other (See Comments)    REACTION: GI upset  . Milk-Related Compounds Other (See Comments)    "I'm lactose intolerant" (02/20/2012)  . Oxycodone Hcl Nausea And Vomiting  . Paroxetine Nausea And Vomiting    Paxil   . Soy Allergy Other (See Comments)    Limited soy d/t "ESTROGEN POSITIVE" cancer  . Eggs Or Egg-Derived Products Other (See Comments)    "can eat fried egg q once in awhile; allergist told me I'm allergic; never had a reaction after eating an egg" (02/20/2012)  . Strawberry Other (See Comments)    "tested and told I was allergic to it; never experienced a reaction to it" (02/20/2012)  . Tree Extract Other (See Comments)    "tested and told I was allergic to it; never experienced a reaction to it" (02/20/2012)    Medications: I have reviewed  the patient's current medications.  Results for orders placed during the hospital encounter of 02/20/12 (from the past 48 hour(s))  TSH     Status: Abnormal   Collection Time   02/20/12  8:12 PM      Component Value Range Comment   TSH 5.295 (*) 0.350 - 4.500 uIU/mL   TROPONIN I     Status: Normal   Collection Time   02/20/12  8:13 PM      Component Value Range Comment   Troponin I <0.30  <0.30 ng/mL   URINE RAPID DRUG SCREEN (HOSP PERFORMED)     Status: Abnormal   Collection Time   02/20/12  8:49 PM      Component Value Range Comment   Opiates POSITIVE (*) NONE DETECTED    Cocaine NONE DETECTED  NONE DETECTED    Benzodiazepines NONE DETECTED  NONE DETECTED    Amphetamines NONE DETECTED  NONE DETECTED    Tetrahydrocannabinol NONE DETECTED  NONE DETECTED    Barbiturates NONE DETECTED  NONE DETECTED   BASIC METABOLIC PANEL     Status: Abnormal   Collection Time   02/21/12  2:08 AM      Component Value Range Comment   Sodium 133 (*) 135 - 145 mEq/L    Potassium 3.7  3.5 - 5.1 mEq/L    Chloride 100  96 - 112 mEq/L    CO2 22  19 - 32 mEq/L    Glucose, Bld 118 (*) 70 - 99 mg/dL    BUN 16  6 - 23 mg/dL    Creatinine, Ser 1.61  0.50 - 1.10 mg/dL    Calcium 8.4  8.4 - 09.6 mg/dL    GFR calc non Af Amer 56 (*) >90 mL/min    GFR calc Af Amer 65 (*) >90 mL/min   CBC     Status: Abnormal   Collection Time   02/21/12  2:08 AM      Component Value Range Comment   WBC 9.1  4.0 - 10.5 K/uL    RBC 3.14 (*) 3.87 - 5.11 MIL/uL    Hemoglobin 10.2 (*) 12.0 - 15.0 g/dL    HCT 04.5 (*) 40.9 - 46.0 %    MCV 97.1  78.0 - 100.0 fL    MCH 32.5  26.0 - 34.0 pg    MCHC 33.4  30.0 - 36.0 g/dL    RDW 81.1  91.4 - 78.2 %    Platelets 189  150 - 400 K/uL   TROPONIN I     Status: Normal  Collection Time   02/21/12  2:08 AM      Component Value Range Comment   Troponin I <0.30  <0.30 ng/mL   TROPONIN I     Status: Normal   Collection Time   02/21/12 12:20 PM      Component Value Range Comment   Troponin I  <0.30  <0.30 ng/mL   INFLUENZA PANEL BY PCR     Status: Normal   Collection Time   02/21/12  3:17 PM      Component Value Range Comment   Influenza A By PCR NEGATIVE  NEGATIVE    Influenza B By PCR NEGATIVE  NEGATIVE    H1N1 flu by pcr NOT DETECTED  NOT DETECTED   T4, FREE     Status: Normal   Collection Time   02/21/12  4:26 PM      Component Value Range Comment   Free T4 1.02  0.80 - 1.80 ng/dL   URIC ACID     Status: Normal   Collection Time   02/21/12  4:26 PM      Component Value Range Comment   Uric Acid, Serum 3.5  2.4 - 7.0 mg/dL   CULTURE, BLOOD (ROUTINE X 2)     Status: Normal (Preliminary result)   Collection Time   02/21/12  4:26 PM      Component Value Range Comment   Specimen Description BLOOD RIGHT HAND      Special Requests BOTTLES DRAWN AEROBIC ONLY 10CC      Culture  Setup Time 02/21/2012 21:39      Culture        Value:        BLOOD CULTURE RECEIVED NO GROWTH TO DATE CULTURE WILL BE HELD FOR 5 DAYS BEFORE ISSUING A FINAL NEGATIVE REPORT   Report Status PENDING     CBC     Status: Abnormal   Collection Time   02/22/12  4:50 AM      Component Value Range Comment   WBC 10.9 (*) 4.0 - 10.5 K/uL    RBC 3.28 (*) 3.87 - 5.11 MIL/uL    Hemoglobin 10.5 (*) 12.0 - 15.0 g/dL    HCT 16.1 (*) 09.6 - 46.0 %    MCV 96.3  78.0 - 100.0 fL    MCH 32.0  26.0 - 34.0 pg    MCHC 33.2  30.0 - 36.0 g/dL    RDW 04.5  40.9 - 81.1 %    Platelets 197  150 - 400 K/uL   BASIC METABOLIC PANEL     Status: Abnormal   Collection Time   02/22/12  4:50 AM      Component Value Range Comment   Sodium 134 (*) 135 - 145 mEq/L    Potassium 3.9  3.5 - 5.1 mEq/L    Chloride 98  96 - 112 mEq/L    CO2 23  19 - 32 mEq/L    Glucose, Bld 102 (*) 70 - 99 mg/dL    BUN 11  6 - 23 mg/dL    Creatinine, Ser 9.14  0.50 - 1.10 mg/dL    Calcium 8.7  8.4 - 78.2 mg/dL    GFR calc non Af Amer 52 (*) >90 mL/min    GFR calc Af Amer 61 (*) >90 mL/min   VITAMIN B12     Status: Normal   Collection Time   02/22/12  4:50  AM      Component Value Range Comment   Vitamin B-12  648  211 - 911 pg/mL   FOLATE     Status: Normal   Collection Time   02/22/12  4:50 AM      Component Value Range Comment   Folate >20.0     IRON AND TIBC     Status: Abnormal   Collection Time   02/22/12  4:50 AM      Component Value Range Comment   Iron <10 (*) 42 - 135 ug/dL    TIBC Not calculated due to Iron <10.  250 - 470 ug/dL    Saturation Ratios Not calculated due to Iron <10.  20 - 55 %    UIBC 241  125 - 400 ug/dL   FERRITIN     Status: Normal   Collection Time   02/22/12  4:50 AM      Component Value Range Comment   Ferritin 289  10 - 291 ng/mL   RETICULOCYTES     Status: Abnormal   Collection Time   02/22/12  4:50 AM      Component Value Range Comment   Retic Ct Pct 0.8  0.4 - 3.1 %    RBC. 3.28 (*) 3.87 - 5.11 MIL/uL    Retic Count, Manual 26.2  19.0 - 186.0 K/uL   SEDIMENTATION RATE     Status: Abnormal   Collection Time   02/22/12  3:40 PM      Component Value Range Comment   Sed Rate 130 (*) 0 - 22 mm/hr     Dg Knee Complete 4 Views Right  02/21/2012  *RADIOLOGY REPORT*  Clinical Data: Right knee pain and swelling.  RIGHT KNEE - COMPLETE 4+ VIEW  Comparison: None.  Findings: There is extensive chondrocalcinosis.  There is a joint effusion.  There is soft tissue swelling of the distal quadriceps tendon.  There are calcifications in the posterior aspect of the joint which may represent loose bodies.  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  Joint effusion.  Abnormal enlargement of the distal quadriceps tendon.   Original Report Authenticated By: Francene Boyers, M.D.     Review of Systems  Constitutional: Positive for fever and chills.  Musculoskeletal: Positive for joint pain.  All other systems reviewed and are negative.   Blood pressure 126/64, pulse 93, temperature 102.7 F (39.3 C), temperature source Oral, resp. rate 20, height 5\' 1"  (1.549 m), weight 47.991 kg (105 lb 12.8 oz),  SpO2 97.00%. Physical Exam  Constitutional: She is oriented to person, place, and time. She appears well-developed and well-nourished.  HENT:  Head: Atraumatic.  Eyes: EOM are normal.  Cardiovascular: Intact distal pulses.   Respiratory: Effort normal.  Musculoskeletal:       Right knee: She exhibits decreased range of motion, swelling, effusion and erythema. tenderness found.       Left knee: She exhibits swelling and effusion. tenderness found.       Right ankle: She exhibits decreased range of motion and swelling. tenderness.  Neurological: She is alert and oriented to person, place, and time.  Skin: Skin is warm and dry.  Psychiatric: She has a normal mood and affect.    Assessment/Plan: R knee swelling/warmth likely gout/pseudogout flair.  ? Precipitated by reclast? Aspirated at bedside, sent for cx/gs/crystals/cell count STAT NPO p MN in case infected though clinical suspicion low Could do oral or intraarticular steroid if gout/pseudogout. Will follow closely  Procedure: after sterile prep sup lat portal position infiltrated with 3 cc lidocaine 1%.  18G needle used  to asp 50cc cloudy yellow fluid.  Light bandage applied, with ACE. Tolerate procedure well.    Ebony Scott 02/22/2012, 5:31 PM

## 2012-02-22 NOTE — Progress Notes (Signed)
  Echocardiogram 2D Echocardiogram has been performed.  Ellender Hose A 02/22/2012, 11:47 AM

## 2012-02-22 NOTE — Progress Notes (Signed)
Patient ID: Ebony Scott  female  ZOX:[id number]    DOB: 1950-10-25    DOA: 02/20/2012  PCP: Leo Grosser, MD  Assessment/Plan: Principal Problem:  *Syncope - 2-D echo showed EF of 60-65% with normal wall motion  Fevers: unclear etiology: possibly viral illness, r/o septic arthritis  - Influenza panel negative, CXR neg for PNA - ordered ESR, CRP, UA and culture - hold off on antibiotics until right knee joint tap for cultures, crystals  - she also had reclast injection this week before this admission which also can cause fevers/  influenza like sx/ arthalgias/ myalgias as side effect (per patient never had these symptoms before with reclast injections)    Active Problems: Hyperthyroidism: - cont PTU   GERD: cont PPI   FEVERS WITH RIGHT KNEE pain with effusion - Ortho consulted, d/w Dr Ave Filter, could be gout/pseudogout vs septic joint (spiking temps, leukocytosis) - hold off on steroids, antibiotics until the tap    Left leg pain - ordered Dopplers for r/o DVT - multiple drug allergies, ordered fentanyl PRN  DVT Prophylaxis: lovenox  Code Status: FC  Disposition:    Subjective: C/o significant pain in right knee and left leg, states cannot ambulate due to pain  Objective: Weight change:   Intake/Output Summary (Last 24 hours) at 02/22/12 1520 Last data filed at 02/22/12 1404  Gross per 24 hour  Intake    720 ml  Output    150 ml  Net    570 ml   Blood pressure 126/64, pulse 93, temperature 102.7 F (39.3 C), temperature source Oral, resp. rate 20, height 5\' 1"  (1.549 m), weight 47.991 kg (105 lb 12.8 oz), SpO2 97.00%.  Physical Exam: General: Alert and awake, oriented x3, not in any acute distress. HEENT: anicteric sclera, pupils reactive to light and accommodation, EOMI CVS: S1-S2 clear, no murmur rubs or gallops Chest: clear to auscultation bilaterally, no wheezing, rales or rhonchi Abdomen: soft nontender, nondistended, normal bowel sounds, no  organomegaly Extremities: right knee tender and swollen Neuro: Cranial nerves II-XII intact, no focal neurological deficits  Lab Results: Basic Metabolic Panel:  Lab 02/22/12 8119 02/21/12 0208  NA 134* 133*  K 3.9 3.7  CL 98 100  CO2 23 22  GLUCOSE 102* 118*  BUN 11 16  CREATININE 1.10 1.04  CALCIUM 8.7 8.4  MG -- --  PHOS -- --   Liver Function Tests:  Lab 02/20/12 1152  AST 29  ALT 32  ALKPHOS 50  BILITOT 0.9  PROT 7.5  ALBUMIN 3.6   CBC:  Lab 02/22/12 0450 02/21/12 0208  WBC 10.9* 9.1  NEUTROABS -- --  HGB 10.5* 10.2*  HCT 31.6* 30.5*  MCV 96.3 97.1  PLT 197 189   Cardiac Enzymes:  Lab 02/21/12 1220 02/21/12 0208 02/20/12 2013  CKTOTAL -- -- --  CKMB -- -- --  CKMBINDEX -- -- --  TROPONINI <0.30 <0.30 <0.30   BNP: No components found with this basename: POCBNP:2 CBG: No results found for this basename: GLUCAP:5 in the last 168 hours   Micro Results: Recent Results (from the past 240 hour(s))  CULTURE, BLOOD (ROUTINE X 2)     Status: Normal (Preliminary result)   Collection Time   02/21/12  4:26 PM      Component Value Range Status Comment   Specimen Description BLOOD RIGHT HAND   Final    Special Requests BOTTLES DRAWN AEROBIC ONLY 10CC   Final    Culture  Setup Time 02/21/2012 21:39  Final    Culture     Final    Value:        BLOOD CULTURE RECEIVED NO GROWTH TO DATE CULTURE WILL BE HELD FOR 5 DAYS BEFORE ISSUING A FINAL NEGATIVE REPORT   Report Status PENDING   Incomplete     Studies/Results: Dg Chest 2 View  02/20/2012  *RADIOLOGY REPORT*  Clinical Data: Cough and chills.  Pain down right leg and right foot.  History of breast cancer.  Symptoms began on Friday night.  CHEST - 2 VIEW  Comparison: 01/25/2012  Findings: Cardiomediastinal silhouette is within normal limits. The lungs are free of focal consolidations and pleural effusions. No pulmonary edema.  Surgical clips overlie the left anterior chest wall/breast.  Surgical clips are  identified in the upper abdomen. Visualized osseous structures have a normal appearance.  IMPRESSION: No evidence for acute cardiopulmonary abnormality.   Original Report Authenticated By: Norva Pavlov, M.D.    Dg Chest 2 View  01/25/2012  *RADIOLOGY REPORT*  Clinical Data: Cough, chest heaviness, dizziness, asthma, question pneumonia  CHEST - 2 VIEW  Comparison: 11/28/2011  Findings: Normal heart size, mediastinal contours, and pulmonary vascularity. Atherosclerotic calcification aorta. Lungs appear slightly hyperinflated with minimal peribronchial thickening likely related to patient's history of asthma. No pulmonary infiltrate, pleural effusion or pneumothorax. Surgical clips left breast. Bones appear demineralized. Prior cervical spine surgery.  IMPRESSION: No acute abnormalities.   Original Report Authenticated By: Ulyses Southward, M.D.    Dg Knee Complete 4 Views Right  02/21/2012  *RADIOLOGY REPORT*  Clinical Data: Right knee pain and swelling.  RIGHT KNEE - COMPLETE 4+ VIEW  Comparison: None.  Findings: There is extensive chondrocalcinosis.  There is a joint effusion.  There is soft tissue swelling of the distal quadriceps tendon.  There are calcifications in the posterior aspect of the joint which may represent loose bodies.  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  Joint effusion.  Abnormal enlargement of the distal quadriceps tendon.   Original Report Authenticated By: Francene Boyers, M.D.    Dg Foot Complete Right  02/20/2012  *RADIOLOGY REPORT*  Clinical Data: Pain in the right leg and foot.  History of breast cancer.  Chills.  RIGHT FOOT COMPLETE - 3+ VIEW  Comparison: 08/27/2011  Findings: There is no evidence for acute fracture or dislocation. No soft tissue foreign body or gas identified.  There is mild degenerative change in the hind foot at the first tarsometatarsal joint.  Small plantar are and Achilles spurs are identified.  IMPRESSION:  1.  Degenerative  changes. 2. No evidence for acute  abnormality.   Original Report Authenticated By: Norva Pavlov, M.D.     Medications: Scheduled Meds:   . acetaminophen  1,000 mg Oral BID  . azelastine  1 spray Each Nare Daily  . enoxaparin (LOVENOX) injection  40 mg Subcutaneous QHS  . feeding supplement  237 mL Oral Q1500  . fluticasone  2 spray Each Nare Daily  . gabapentin  600 mg Oral Daily  . guaiFENesin  600 mg Oral BID  . lipase/protease/amylase  1 capsule Oral TID AC  . loratadine  10 mg Oral Daily  . mometasone-formoterol  1 puff Inhalation BID  . montelukast  10 mg Oral QHS  . multivitamin with minerals  1 tablet Oral Daily  . propylthiouracil  50 mg Oral BID  . sodium chloride  3 mL Intravenous Q12H      LOS: 2 days   Greco Gastelum M.D. [facility] 02/22/2012,  3:20 PM Pager: [phone]  If 7PM-7AM, please contact night-coverage [email or web address] Password [email or web address]

## 2012-02-23 DIAGNOSIS — M79609 Pain in unspecified limb: Secondary | ICD-10-CM

## 2012-02-23 LAB — URINE CULTURE
Colony Count: NO GROWTH
Culture: NO GROWTH

## 2012-02-23 LAB — BASIC METABOLIC PANEL
BUN: 13 mg/dL (ref 6–23)
CO2: 23 mEq/L (ref 19–32)
Calcium: 8.3 mg/dL — ABNORMAL LOW (ref 8.4–10.5)
Chloride: 99 mEq/L (ref 96–112)
Creatinine, Ser: 1.12 mg/dL — ABNORMAL HIGH (ref 0.50–1.10)
GFR calc Af Amer: 59 mL/min — ABNORMAL LOW (ref >90)
GFR calc non Af Amer: 51 mL/min — ABNORMAL LOW (ref >90)
Glucose, Bld: 99 mg/dL (ref 70–99)
Potassium: 4.2 mEq/L (ref 3.5–5.1)
Sodium: 136 mEq/L (ref 135–145)

## 2012-02-23 LAB — CBC
HCT: 31.1 % — ABNORMAL LOW (ref 36.0–46.0)
Hemoglobin: 10.4 g/dL — ABNORMAL LOW (ref 12.0–15.0)
MCH: 32 pg (ref 26.0–34.0)
MCHC: 33.4 g/dL (ref 30.0–36.0)
MCV: 95.7 fL (ref 78.0–100.0)
Platelets: 227 10*3/uL (ref 150–400)
RBC: 3.25 MIL/uL — ABNORMAL LOW (ref 3.87–5.11)
RDW: 12.8 % (ref 11.5–15.5)
WBC: 11.2 10*3/uL — ABNORMAL HIGH (ref 4.0–10.5)

## 2012-02-23 LAB — PROTIME-INR
INR: 1.17 (ref 0.00–1.49)
Prothrombin Time: 14.7 seconds (ref 11.6–15.2)

## 2012-02-23 LAB — C-REACTIVE PROTEIN: CRP: 23.8 mg/dL — ABNORMAL HIGH (ref <0.60)

## 2012-02-23 MED ORDER — PATIENT'S GUIDE TO USING COUMADIN BOOK
Freq: Once | Status: AC
  Administered 2012-02-23: 20:00:00
  Filled 2012-02-23: qty 1

## 2012-02-23 MED ORDER — SIMETHICONE 80 MG PO CHEW
80.0000 mg | CHEWABLE_TABLET | Freq: Four times a day (QID) | ORAL | Status: DC | PRN
  Administered 2012-02-23 – 2012-02-25 (×4): 80 mg via ORAL
  Filled 2012-02-23 (×5): qty 1

## 2012-02-23 MED ORDER — MONTELUKAST SODIUM 10 MG PO TABS
10.0000 mg | ORAL_TABLET | Freq: Every day | ORAL | Status: DC
  Administered 2012-02-24 – 2012-02-26 (×3): 10 mg via ORAL
  Filled 2012-02-23 (×3): qty 1

## 2012-02-23 MED ORDER — AZELASTINE HCL 0.1 % NA SOLN
1.0000 | Freq: Every day | NASAL | Status: DC
  Administered 2012-02-24 – 2012-02-25 (×2): 1 via NASAL
  Filled 2012-02-23: qty 30

## 2012-02-23 MED ORDER — ENOXAPARIN SODIUM 60 MG/0.6ML ~~LOC~~ SOLN
1.0000 mg/kg | Freq: Two times a day (BID) | SUBCUTANEOUS | Status: DC
  Administered 2012-02-23 – 2012-02-25 (×4): 50 mg via SUBCUTANEOUS
  Filled 2012-02-23 (×7): qty 0.6

## 2012-02-23 MED ORDER — LORATADINE 10 MG PO TABS
10.0000 mg | ORAL_TABLET | Freq: Every day | ORAL | Status: DC
  Administered 2012-02-23 – 2012-02-25 (×3): 10 mg via ORAL
  Filled 2012-02-23 (×4): qty 1

## 2012-02-23 MED ORDER — WARFARIN - PHARMACIST DOSING INPATIENT: Freq: Every day | Status: DC

## 2012-02-23 MED ORDER — WARFARIN SODIUM 5 MG PO TABS
5.0000 mg | ORAL_TABLET | Freq: Every day | ORAL | Status: DC
  Administered 2012-02-23 – 2012-02-24 (×2): 5 mg via ORAL
  Filled 2012-02-23 (×3): qty 1

## 2012-02-23 MED ORDER — TRIAMCINOLONE ACETONIDE 40 MG/ML IJ SUSP
80.0000 mg | Freq: Once | INTRAMUSCULAR | Status: AC
  Administered 2012-02-23: 80 mg via INTRA_ARTICULAR
  Filled 2012-02-23: qty 2

## 2012-02-23 MED ORDER — WARFARIN VIDEO
Freq: Once | Status: AC
  Administered 2012-02-23: 20:00:00

## 2012-02-23 NOTE — Progress Notes (Signed)
Patient ID: Ebony Scott  female  RUE:[id number]    DOB: [birth year]    DOA: 02/20/2012  PCP: Leo Grosser, MD  Assessment/Plan: Principal Problem:  *Syncope: likely vasovagal - 2-D echo showed EF of 60-65% with normal wall motion  Fevers: unclear etiology, possibly viral illness, vs sec to reclast side effect - Influenza panel negative, CXR neg for PNA - ordered ESR, CRP, UA and culture - hold off on antibiotics until right knee joint tap for cultures, crystals  - she also had reclast injection this week before this admission which also can cause fevers/  influenza like sx/ arthalgias/ myalgias as side effect (per patient never had these symptoms before with reclast injections)    Hyperthyroidism: - cont PTU   GERD: cont PPI   RIGHT KNEE, left knee and Rt ankle pain with effusion - Highly appreciate Dr Veda Canning assistance, multiple joint involvement, 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 Less likely septic joint, s/p steroid injections today.  - will start PT tomorrow   Left leg pain - ordered Dopplers for r/o DVT, still pending - multiple drug allergies, ordered fentanyl PRN  DVT Prophylaxis: lovenox  Code Status: FC  Disposition:    Subjective: C/o still significant pain in right knee and left knee, right ankle pain, feels miserable today, does not want PT to work with her until she feels better  Objective: Weight change:   Intake/Output Summary (Last 24 hours) at 02/23/12 1316 Last data filed at 02/23/12 0700  Gross per 24 hour  Intake    720 ml  Output    500 ml  Net    220 ml   Blood pressure 111/40, pulse 78, temperature 99.1 F (37.3 C), temperature source Oral, resp. rate 20, height 5\' 1"  (1.549 m), weight 47.991 kg (105 lb 12.8 oz), SpO2 99.00%.  Physical Exam: General: Alert and awake, oriented x3, not in any acute distress. HEENT: anicteric sclera, pupils reactive to light and accommodation, EOMI CVS: S1-S2 clear, no murmur rubs or gallops Chest: clear to  auscultation bilaterally, no wheezing, rales or rhonchi Abdomen: soft nontender, nondistended, normal bowel sounds, no organomegaly Extremities: right knee bandage intact, left knee tender and swollen   Lab Results: Basic Metabolic Panel:  Lab 02/23/12 1478 02/22/12 0450  NA 136 134*  K 4.2 3.9  CL 99 98  CO2 23 23  GLUCOSE 99 102*  BUN 13 11  CREATININE 1.12* 1.10  CALCIUM 8.3* 8.7  MG -- --  PHOS -- --   Liver Function Tests:  Lab 02/20/12 1152  AST 29  ALT 32  ALKPHOS 50  BILITOT 0.9  PROT 7.5  ALBUMIN 3.6   CBC:  Lab 02/23/12 0640 02/22/12 0450  WBC 11.2* 10.9*  NEUTROABS -- --  HGB 10.4* 10.5*  HCT 31.1* 31.6*  MCV 95.7 96.3  PLT 227 197   Cardiac Enzymes:  Lab 02/21/12 1220 02/21/12 0208 02/20/12 2013  CKTOTAL -- -- --  CKMB -- -- --  CKMBINDEX -- -- --  TROPONINI <0.30 <0.30 <0.30   BNP: No components found with this basename: POCBNP:2 CBG: No results found for this basename: GLUCAP:5 in the last 168 hours   Micro Results: Recent Results (from the past 240 hour(s))  CULTURE, BLOOD (ROUTINE X 2)     Status: Normal (Preliminary result)   Collection Time   02/21/12  4:26 PM      Component Value Range Status Comment   Specimen Description BLOOD RIGHT HAND   Final    Special Requests BOTTLES  DRAWN AEROBIC ONLY 10CC   Final    Culture  Setup Time 02/21/2012 21:39   Final    Culture     Final    Value:        BLOOD CULTURE RECEIVED NO GROWTH TO DATE CULTURE WILL BE HELD FOR 5 DAYS BEFORE ISSUING A FINAL NEGATIVE REPORT   Report Status PENDING   Incomplete   BODY FLUID CULTURE     Status: Normal (Preliminary result)   Collection Time   02/22/12  5:57 PM      Component Value Range Status Comment   Specimen Description FLUID SYNOVIAL RIGHT KNEE   Final    Special Requests NONE   Final    Gram Stain     Final    Value: ABUNDANT WBC WBC PRESENT,BOTH PMN AND MONONUCLEAR     NO SQUAMOUS EPITHELIAL CELLS SEEN     NO ORGANISMS SEEN   Culture PENDING    Incomplete    Report Status PENDING   Incomplete     Studies/Results: Dg Chest 2 View  02/20/2012  *RADIOLOGY REPORT*  Clinical Data: Cough and chills.  Pain down right leg and right foot.  History of breast cancer.  Symptoms began on Friday night.  CHEST - 2 VIEW  Comparison: 01/25/2012  Findings: Cardiomediastinal silhouette is within normal limits. The lungs are free of focal consolidations and pleural effusions. No pulmonary edema.  Surgical clips overlie the left anterior chest wall/breast.  Surgical clips are identified in the upper abdomen. Visualized osseous structures have a normal appearance.  IMPRESSION: No evidence for acute cardiopulmonary abnormality.   Original Report Authenticated By: Norva Pavlov, M.D.    Dg Chest 2 View  01/25/2012  *RADIOLOGY REPORT*  Clinical Data: Cough, chest heaviness, dizziness, asthma, question pneumonia  CHEST - 2 VIEW  Comparison: 11/28/2011  Findings: Normal heart size, mediastinal contours, and pulmonary vascularity. Atherosclerotic calcification aorta. Lungs appear slightly hyperinflated with minimal peribronchial thickening likely related to patient's history of asthma. No pulmonary infiltrate, pleural effusion or pneumothorax. Surgical clips left breast. Bones appear demineralized. Prior cervical spine surgery.  IMPRESSION: No acute abnormalities.   Original Report Authenticated By: Ulyses Southward, M.D.    Dg Knee Complete 4 Views Right  02/21/2012  *RADIOLOGY REPORT*  Clinical Data: Right knee pain and swelling.  RIGHT KNEE - COMPLETE 4+ VIEW  Comparison: None.  Findings: There is extensive chondrocalcinosis.  There is a joint effusion.  There is soft tissue swelling of the distal quadriceps tendon.  There are calcifications in the posterior aspect of the joint which may represent loose bodies.  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  Joint effusion.  Abnormal enlargement of the distal quadriceps tendon.   Original Report  Authenticated By: Francene Boyers, M.D.    Dg Foot Complete Right  02/20/2012  *RADIOLOGY REPORT*  Clinical Data: Pain in the right leg and foot.  History of breast cancer.  Chills.  RIGHT FOOT COMPLETE - 3+ VIEW  Comparison: 08/27/2011  Findings: There is no evidence for acute fracture or dislocation. No soft tissue foreign body or gas identified.  There is mild degenerative change in the hind foot at the first tarsometatarsal joint.  Small plantar are and Achilles spurs are identified.  IMPRESSION:  1.  Degenerative changes. 2. No evidence for acute  abnormality.   Original Report Authenticated By: Norva Pavlov, M.D.     Medications: Scheduled Meds:    . acetaminophen  1,000 mg Oral BID  . azelastine  1 spray Each Nare QHS  . enoxaparin (LOVENOX) injection  40 mg Subcutaneous QHS  . feeding supplement  237 mL Oral Q1500  . fluticasone  2 spray Each Nare Daily  . gabapentin  600 mg Oral Daily  . guaiFENesin  600 mg Oral BID  . lidocaine  20 mL Other Once  . lipase/protease/amylase  1 capsule Oral TID AC  . loratadine  10 mg Oral QHS  . mometasone-formoterol  1 puff Inhalation BID  . montelukast  10 mg Oral Daily  . multivitamin with minerals  1 tablet Oral Daily  . propylthiouracil  50 mg Oral BID  . sodium chloride  3 mL Intravenous Q12H  . triamcinolone acetonide  40 mg Intra-articular Once  . triamcinolone acetonide  80 mg Intra-articular Once      LOS: 3 days   Shonique Pelphrey M.D. [facility] 02/23/2012, 1:16 PM Pager: [phone]  If 7PM-7AM, please contact night-coverage [email or web address] Password [email or web address]

## 2012-02-23 NOTE — Progress Notes (Signed)
PATIENT ID: Ebony Scott        Subjective: Reports right knee feeling much better today. Continued pain and swelling in left knee and right ankle. Difficulty sleeping last night due to pain. No other complaints or concerns.  Objective:  Filed Vitals:   02/23/12 0404  BP: 111/40  Pulse: 78  Temp:   Resp: 20    No skin color changes Right knee with no swelling, erythema, warmth. Nontender to palpation.  Right ankle with mod swelling. No erythema, warmth. Exquisitely tender to palpation, especially lateral aspect.  Left knee with mod swelling, lrg effusion. No erythema, warmth. Diffusely tender to palpation.  NVID     Labs:   [facility] 02/23/12 0640 02/22/12 0450 02/21/12 0208 02/20/12 1152  HGB 10.4* 10.5* 10.2* 11.0*   Basename 02/23/12 0640 02/22/12 0450  WBC 11.2* 10.9*  RBC 3.25* 3.28*  HCT 31.1* 31.6*  PLT 227 197   Basename 02/23/12 0640 02/22/12 0450  NA 136 134*  K 4.2 3.9  CL 99 98  CO2 23 23  BUN 13 11  CREATININE 1.12* 1.10  GLUCOSE 99 102*  CALCIUM 8.3* 8.7    Assessment and Plan: Right knee symptoms improved after aspiration yesterday, will inject with corticosteroid today. Synovial fluid consistent with pseudogout, no organisms seen. Left knee pain and swelling, also likely pseudogout, no evidence of infection, will aspirate and inject with corticosteroid today. Wrap with ace. 60cc aspirated, cloudy yellow fluid, will not send for labs Right ankle pain and swelling, no evidence of infection, will aspirate and inject with corticosteroid today and wrap with ace for compression. 5cc aspirate, cloudy yellow, will not send for labs Will continue to follow and see if symptoms improve with aspiration and steroid injection.  Procedure note: Right knee corticosteroid injection The right knee was sterily prepped and landmarks identified. A mixture of 1cc Kenalog and 7cc lidocaine 1% was injected into the joint through the superior lateral portal position without  resistance. The needle was removed. Light bandage applied. Tolerated procedure well.   Procedure note: Left knee aspiration and corticosteroid injection The left knee was sterily prepped and landmarks identified.  3 cc of lidocaine 1% was injected into the knee joint through the sup lateral portal position. A 18g needed was then introduced into the joint and 60cc of cloudy yellow fluid was aspirated. A mixture of 1cc Kenalog and 7cc lidocaine 1% was injected into the joint through the superior lateral portal position without resistance. The needle was removed. Light bandage applied, wrapped with ACE. Tolerated procedure well.  Prodecure note: Right ankle aspiration and corticosteroid injection The right ankle was sterily prepped and landmarks identified.  A 18g needed was then introduced into the joint and 5cc of cloudy yellow fluid was aspirated. A mixture of 1cc Kenalog and 5cc lidocaine 1% was injected into the joint through the lateral portal position.The needle was removed. Light bandage applied, wrapped with ACE. Tolerated procedure well.   VTE proph: per primary team

## 2012-02-23 NOTE — Progress Notes (Signed)
VASCULAR LAB PRELIMINARY  PRELIMINARY  PRELIMINARY  PRELIMINARY  Bilateral lower extremity venous Dopplers completed.    Preliminary report:  There is acute, occlusive DVT noted in the left posterior tibial and peroneal veins.  Kareem Cathey, RVT 02/23/2012, 3:11 PM

## 2012-02-23 NOTE — Progress Notes (Signed)
ANTICOAGULATION CONSULT NOTE - Initial Consult  Pharmacy Consult for coumadin Indication: DVT  Allergies  Allergen Reactions  . Dust Mite Extract Shortness Of Breath and Other (See Comments)    "sneezing" (02/20/2012)  . Molds & Smuts Shortness Of Breath  . Morphine Sulfate Itching  . Other Shortness Of Breath and Other (See Comments)    Grass and weeds "sneezing; filled sinuses" (02/20/2012)  . Penicillins Rash and Other (See Comments)    "welts" (02/20/2012)  . Rofecoxib Swelling    Celebrex REACTION: feet swelling  . Shrimp Flavor Anaphylaxis    ALL SHELLFISH  . Tetracycline Hcl Nausea And Vomiting  . Dilaudid (Hydromorphone Hcl) Itching  . Hydrocodone-Acetaminophen Nausea And Vomiting  . Levofloxacin Other (See Comments)    REACTION: GI upset  . Milk-Related Compounds Other (See Comments)    "I'm lactose intolerant" (02/20/2012)  . Oxycodone Hcl Nausea And Vomiting  . Paroxetine Nausea And Vomiting    Paxil   . Soy Allergy Other (See Comments)    Limited soy d/t "ESTROGEN POSITIVE" cancer  . Eggs Or Egg-Derived Products Other (See Comments)    "can eat fried egg q once in awhile; allergist told me I'm allergic; never had a reaction after eating an egg" (02/20/2012)  . Strawberry Other (See Comments)    "tested and told I was allergic to it; never experienced a reaction to it" (02/20/2012)  . Tree Extract Other (See Comments)    "tested and told I was allergic to it; never experienced a reaction to it" (02/20/2012)    Patient Measurements: Height: 5\' 1"  (154.9 cm) Weight: 105 lb 12.8 oz (47.991 kg) IBW/kg (Calculated) : 47.8    Vital Signs: Temp: 99.1 F (37.3 C) (01/04 1447) Temp src: Oral (01/04 1447) BP: 120/70 mmHg (01/04 1447) Pulse Rate: 80  (01/04 1447)  Labs:  Basename 02/23/12 0640 02/22/12 0450 02/21/12 1220 02/21/12 0208 02/20/12 2013  HGB 10.4* 10.5* -- -- --  HCT 31.1* 31.6* -- 30.5* --  PLT 227 197 -- 189 --  APTT -- -- -- -- --  LABPROT -- -- -- -- --   INR -- -- -- -- --  HEPARINUNFRC -- -- -- -- --  CREATININE 1.12* 1.10 -- 1.04 --  CKTOTAL -- -- -- -- --  CKMB -- -- -- -- --  TROPONINI -- -- <0.30 <0.30 <0.30    Estimated Creatinine Clearance: 38.8 ml/min (by C-G formula based on Cr of 1.12).   Medical History: Past Medical History  Diagnosis Date  . Chronic neck pain   . Allergic rhinitis   . Moderate persistent asthma     -FeV1 72% 2011, -IgE 102 2011, CT sinus Neg 2011  . GERD (gastroesophageal reflux disease)   . Hypothyroidism   . Osteoporosis     on reclast yearly  . Hiatal hernia   . Diverticulosis   . Hemorrhoids   . Asthma   . IBS (irritable bowel syndrome)   . Complication of anesthesia     "had hard time waking up from it several times" (02/20/2012)  . Hypercholesteremia   . Pneumonia 04/2011; ~ 11/2011    "double; single" (02/20/2012)  . Exertional dyspnea   . Graves disease   . Headache     "related to allergies; more at different times during the year" (02/20/2012)  . Arthritis     "mostly the hands" (02/20/2012)  . Fibromyalgia 11/2011  . Chronic lower back pain   . Depression     "some; don't  take anything for it" (02/20/2012)  . Breast cancer 1998    in remission    Medications:  Prescriptions prior to admission  Medication Sig Dispense Refill  . acetaminophen (TYLENOL ARTHRITIS PAIN) 650 MG CR tablet Take 1,300 mg by mouth 2 (two) times daily.        . Alpha-D-Galactosidase (BEANO) TABS Take 1 tablet by mouth daily.       . Alpha-Lipoic Acid 600 MG CAPS Take 600 mg by mouth daily. Once a day      . Alum & Mag Hydroxide-Simeth (MAGIC MOUTHWASH) SOLN Take 5-10 mLs by mouth 3 (three) times daily as needed. For irritation      . Azelastine HCl 0.15 % SOLN Place 1 spray into the nose daily.      . Calcium Citrate-Vitamin D (CITRACAL MAXIMUM) 315-250 MG-UNIT TABS Take 1 tablet by mouth 2 (two) times daily. 1 capsule twice a day      . Cholecalciferol (VITAMIN D) 2000 UNITS CAPS Take 0.5 capsules by mouth  at bedtime. 1/2 at bedtime      . EPINEPHrine (EPIPEN JR) 0.15 MG/0.3ML injection Inject 0.15 mg into the muscle as needed. For reaction      . gabapentin (NEURONTIN) 600 MG tablet Take 600 mg by mouth daily. Once a day      . guaiFENesin (MUCINEX) 600 MG 12 hr tablet Take 600 mg by mouth 2 (two) times daily.      Marland Kitchen lactase (LACTAID) 3000 UNITS tablet Take 3 tablets by mouth as needed. 3-4 tabs with dairy liquids and solids      . levocetirizine (XYZAL) 5 MG tablet Take 5 mg by mouth daily.      Marland Kitchen lidocaine (LIDODERM) 5 % Place 1 patch onto the skin daily as needed. Remove & Discard patch within 12 hours or as directed by MD      . Melatonin 3 MG CAPS Take 9 mg by mouth at bedtime. 3 tabs at bedtime      . mometasone (NASONEX) 50 MCG/ACT nasal spray Place 2 sprays into the nose daily.      . mometasone-formoterol (DULERA) 100-5 MCG/ACT AERO Inhale 1 puff into the lungs 2 (two) times daily.       . montelukast (SINGULAIR) 10 MG tablet Take 10 mg by mouth Daily.       Marland Kitchen neomycin-bacitracin-polymyxin (TRIPLE ANTIBIOTIC) ophthalmic ointment Apply as needed       . nystatin-triamcinolone (MYCOLOG II) cream Apply 1 application topically 2 (two) times daily as needed. For irritation      . omalizumab (XOLAIR) 150 MG injection Inject 150 mg into the skin every 28 (twenty-eight) days. 150 SQ every 4 weeks      . Pancrelipase, Lip-Prot-Amyl, (CREON) 24000 UNITS CPEP Take by mouth. 1 with each meal       . Polyethyl Glycol-Propyl Glycol (SYSTANE ULTRA) 0.4-0.3 % SOLN Apply 1 drop to eye 2 (two) times daily as needed. 1-2 drops each eye every morning and at bedtime      . Prenatal Vit-Sel-Fe Fum-FA (VINATE M) 27-1 MG TABS Take 1 tablet by mouth daily.      . Probiotic Product (ALIGN) 4 MG CAPS Take 1 tablet by mouth daily. Once a day      . propylthiouracil (PTU) 50 MG tablet Take 50 mg by mouth 2 (two) times daily.       . Simethicone (GAS-X EXTRA STRENGTH) 125 MG CAPS Take 2 each by mouth daily as needed.  For  gas relief      . zoledronic acid (RECLAST) 5 MG/100ML SOLN Inject 5 mg into the vein once. Once a year        Assessment: Ms. Bonneville is a 62 yo F admitted with syncope to start full dose LMWH and coumadin today for new L DVT.  She is s/p aspiration of her R knee for swelling/warmth on 02/22/12.  She had a reclast injection earlier this week prior to admission.  She has been on DVT prophylaxis dose of LMWH since 1/1 at 2200. Now she will start full dose for DVT treatment. Today is day # 1/5 of minimum required overlap for VTE treatment.  Her H/H is 10.4/31.1 which is stable.  Her PLTC is 227 which is stable.  Her coumadin score =2.    Goal of Therapy:  INR 2-3 Monitor platelets by anticoagulation protocol: Yes   Plan:  1. Coumadin 5 mg po daily 2. LMWH 1 mg/kg sq q12h per MD 3. Daily INR, including today for baseline 4. Coumadin book and video for pt education Herby Abraham, Pharm.D. [phone] 02/23/2012 5:07 PM

## 2012-02-24 ENCOUNTER — Observation Stay (HOSPITAL_COMMUNITY): Payer: Medicare Other

## 2012-02-24 DIAGNOSIS — M1120 Other chondrocalcinosis, unspecified site: Secondary | ICD-10-CM | POA: Diagnosis present

## 2012-02-24 DIAGNOSIS — I82409 Acute embolism and thrombosis of unspecified deep veins of unspecified lower extremity: Secondary | ICD-10-CM | POA: Diagnosis present

## 2012-02-24 DIAGNOSIS — M109 Gout, unspecified: Secondary | ICD-10-CM

## 2012-02-24 DIAGNOSIS — K219 Gastro-esophageal reflux disease without esophagitis: Secondary | ICD-10-CM

## 2012-02-24 LAB — BASIC METABOLIC PANEL
BUN: 25 mg/dL — ABNORMAL HIGH (ref 6–23)
CO2: 23 mEq/L (ref 19–32)
Calcium: 9.1 mg/dL (ref 8.4–10.5)
Chloride: 98 mEq/L (ref 96–112)
Creatinine, Ser: 0.89 mg/dL (ref 0.50–1.10)
GFR calc Af Amer: 78 mL/min — ABNORMAL LOW (ref >90)
GFR calc non Af Amer: 68 mL/min — ABNORMAL LOW (ref >90)
Glucose, Bld: 164 mg/dL — ABNORMAL HIGH (ref 70–99)
Potassium: 4.1 mEq/L (ref 3.5–5.1)
Sodium: 135 mEq/L (ref 135–145)

## 2012-02-24 LAB — CBC
HCT: 30.8 % — ABNORMAL LOW (ref 36.0–46.0)
Hemoglobin: 10.6 g/dL — ABNORMAL LOW (ref 12.0–15.0)
MCH: 32.5 pg (ref 26.0–34.0)
MCHC: 34.4 g/dL (ref 30.0–36.0)
MCV: 94.5 fL (ref 78.0–100.0)
Platelets: 306 10*3/uL (ref 150–400)
RBC: 3.26 MIL/uL — ABNORMAL LOW (ref 3.87–5.11)
RDW: 12.6 % (ref 11.5–15.5)
WBC: 14.8 10*3/uL — ABNORMAL HIGH (ref 4.0–10.5)

## 2012-02-24 LAB — PROTIME-INR
INR: 1.07 (ref 0.00–1.49)
Prothrombin Time: 13.8 seconds (ref 11.6–15.2)

## 2012-02-24 MED ORDER — IOHEXOL 350 MG/ML SOLN
65.0000 mL | Freq: Once | INTRAVENOUS | Status: AC | PRN
  Administered 2012-02-24: 65 mL via INTRAVENOUS

## 2012-02-24 MED ORDER — MAGIC MOUTHWASH
5.0000 mL | Freq: Four times a day (QID) | ORAL | Status: DC
  Administered 2012-02-24 – 2012-02-26 (×9): 5 mL via ORAL
  Filled 2012-02-24 (×11): qty 5

## 2012-02-24 NOTE — Progress Notes (Signed)
NCM sent benefit check for Xarelto.    CARE MANAGEMENT NOTE 02/24/2012  Patient:  Ebony Scott, Ebony Scott   Account Number:  [id number]  Date Initiated:  02/24/2012  Documentation initiated by:  [facility]  Subjective/Objective Assessment:     Action/Plan:   lives alone, HH   Anticipated DC Date:  02/24/2012   Anticipated DC Plan:  HOME/SELF CARE      DC Planning Services  CM consult      Choice offered to / List presented to:             Status of service:  In process, will continue to follow Medicare Important Message given?   (If response is "NO", the following Medicare IM given date fields will be blank) Date Medicare IM given:   Date Additional Medicare IM given:    Discharge Disposition:    Per UR Regulation:    If discussed at Long Length of Stay Meetings, dates discussed:    Comments:  02/24/2011 1830 NCM spoke to pt and she was able to provide insurance info. NCM requested Unit RN fax new insurance info down to admission. States she lives at home alone and son helps at home but he works at night. Spoke to pt about home alert bracelet in case of emergency. States she had it for her mother. Pt states she uses [employer], [phone] or [address] [facility] [place] # [phone]. Will need to verify [facility] has Xarelto at d/c.

## 2012-02-24 NOTE — Progress Notes (Signed)
Patient ID: Ebony Scott  female  YNW:[id number]    DOB: 1950/04/03    DOA: 02/20/2012  PCP: Leo Grosser, MD  Assessment/Plan: Principal Problem:  *Syncope: likely vasovagal - 2-D echo showed EF of 60-65% with normal wall motion  Fevers: unclear etiology, possibly viral illness, vs sec to reclast side effect, resolved - Influenza panel negative, CXR neg for PNA - ordered ESR, CRP, Urine cultures showed no growth - she also had reclast injection this week before this admission which also can cause fevers/  influenza like sx/ arthalgias/ myalgias as side effect (per patient never had these symptoms before with reclast injections)    Hyperthyroidism: - cont PTU   GERD: cont PPI   RIGHT KNEE, left knee and Rt ankle pain with effusion: feels better - Highly appreciate Dr Veda Canning assistance, multiple joint involvement, 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 Less likely septic joint, s/p steroid injections  Left leg acute DVT - Started on Coumadin and full dose Lovenox - CT angiogram of the chest did not show any acute PE, did show right apical groundglass opacity, followup CT in 3 months. May need biopsy and further workup, placed pulmonology referral patient at the time of discharge. - case management for looking into xarelto  DVT Prophylaxis: lovenox  Code Status: FC  Disposition:    Subjective: Pain significantly improved in the knee joints however patient is complaining of chest pain  Objective: Weight change:   Intake/Output Summary (Last 24 hours) at 02/24/12 1204 Last data filed at 02/24/12 0300  Gross per 24 hour  Intake    480 ml  Output    700 ml  Net   -220 ml   Blood pressure 124/73, pulse 80, temperature 97.7 F (36.5 C), temperature source Oral, resp. rate 18, height 5\' 1"  (1.549 m), weight 47.991 kg (105 lb 12.8 oz), SpO2 98.00%.  Physical Exam: General: Alert and awake, oriented x3, not in any acute distress. HEENT: anicteric sclera, pupils reactive to light and  accommodation, EOMI CVS: S1-S2 clear, no murmur rubs or gallops Chest: CTAB Abdomen: soft NT, ND, NBS Extremities: right knee bandage intact, left knee and right ankle bandage intact   Lab Results: Basic Metabolic Panel:  Lab 02/24/12 6578 02/23/12 0640  NA 135 136  K 4.1 4.2  CL 98 99  CO2 23 23  GLUCOSE 164* 99  BUN 25* 13  CREATININE 0.89 1.12*  CALCIUM 9.1 8.3*  MG -- --  PHOS -- --   Liver Function Tests:  Lab 02/20/12 1152  AST 29  ALT 32  ALKPHOS 50  BILITOT 0.9  PROT 7.5  ALBUMIN 3.6   CBC:  Lab 02/24/12 0730 02/23/12 0640  WBC 14.8* 11.2*  NEUTROABS -- --  HGB 10.6* 10.4*  HCT 30.8* 31.1*  MCV 94.5 95.7  PLT 306 227   Cardiac Enzymes:  Lab 02/21/12 1220 02/21/12 0208 02/20/12 2013  CKTOTAL -- -- --  CKMB -- -- --  CKMBINDEX -- -- --  TROPONINI <0.30 <0.30 <0.30   BNP: No components found with this basename: POCBNP:2 CBG: No results found for this basename: GLUCAP:5 in the last 168 hours   Micro Results: Recent Results (from the past 240 hour(s))  CULTURE, BLOOD (ROUTINE X 2)     Status: Normal (Preliminary result)   Collection Time   02/21/12  4:18 PM      Component Value Range Status Comment   Specimen Description BLOOD RIGHT FOREARM   Final    Special Requests BOTTLES DRAWN AEROBIC ONLY  5CC   Final    Culture  Setup Time 02/22/2012 03:46   Final    Culture     Final    Value:        BLOOD CULTURE RECEIVED NO GROWTH TO DATE CULTURE WILL BE HELD FOR 5 DAYS BEFORE ISSUING A FINAL NEGATIVE REPORT   Report Status PENDING   Incomplete   CULTURE, BLOOD (ROUTINE X 2)     Status: Normal (Preliminary result)   Collection Time   02/21/12  4:26 PM      Component Value Range Status Comment   Specimen Description BLOOD RIGHT HAND   Final    Special Requests BOTTLES DRAWN AEROBIC ONLY 10CC   Final    Culture  Setup Time 02/21/2012 21:39   Final    Culture     Final    Value:        BLOOD CULTURE RECEIVED NO GROWTH TO DATE CULTURE WILL BE HELD FOR 5  DAYS BEFORE ISSUING A FINAL NEGATIVE REPORT   Report Status PENDING   Incomplete   URINE CULTURE     Status: Normal   Collection Time   02/22/12  4:52 PM      Component Value Range Status Comment   Specimen Description URINE, RANDOM   Final    Special Requests NONE   Final    Culture  Setup Time 02/22/2012 18:23   Final    Colony Count NO GROWTH   Final    Culture NO GROWTH   Final    Report Status 02/23/2012 FINAL   Final   BODY FLUID CULTURE     Status: Normal (Preliminary result)   Collection Time   02/22/12  5:57 PM      Component Value Range Status Comment   Specimen Description FLUID SYNOVIAL RIGHT KNEE   Final    Special Requests NONE   Final    Gram Stain     Final    Value: ABUNDANT WBC WBC PRESENT,BOTH PMN AND MONONUCLEAR     NO SQUAMOUS EPITHELIAL CELLS SEEN     NO ORGANISMS SEEN   Culture NO GROWTH 1 DAY   Final    Report Status PENDING   Incomplete     Studies/Results: Dg Chest 2 View  02/20/2012  *RADIOLOGY REPORT*  Clinical Data: Cough and chills.  Pain down right leg and right foot.  History of breast cancer.  Symptoms began on Friday night.  CHEST - 2 VIEW  Comparison: 01/25/2012  Findings: Cardiomediastinal silhouette is within normal limits. The lungs are free of focal consolidations and pleural effusions. No pulmonary edema.  Surgical clips overlie the left anterior chest wall/breast.  Surgical clips are identified in the upper abdomen. Visualized osseous structures have a normal appearance.  IMPRESSION: No evidence for acute cardiopulmonary abnormality.   Original Report Authenticated By: Norva Pavlov, M.D.    Dg Chest 2 View  01/25/2012  *RADIOLOGY REPORT*  Clinical Data: Cough, chest heaviness, dizziness, asthma, question pneumonia  CHEST - 2 VIEW  Comparison: 11/28/2011  Findings: Normal heart size, mediastinal contours, and pulmonary vascularity. Atherosclerotic calcification aorta. Lungs appear slightly hyperinflated with minimal peribronchial thickening likely  related to patient's history of asthma. No pulmonary infiltrate, pleural effusion or pneumothorax. Surgical clips left breast. Bones appear demineralized. Prior cervical spine surgery.  IMPRESSION: No acute abnormalities.   Original Report Authenticated By: Ulyses Southward, M.D.    Dg Knee Complete 4 Views Right  02/21/2012  *RADIOLOGY REPORT*  Clinical Data: Right  knee pain and swelling.  RIGHT KNEE - COMPLETE 4+ VIEW  Comparison: None.  Findings: There is extensive chondrocalcinosis.  There is a joint effusion.  There is soft tissue swelling of the distal quadriceps tendon.  There are calcifications in the posterior aspect of the joint which may represent loose bodies.  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  Joint effusion.  Abnormal enlargement of the distal quadriceps tendon.   Original Report Authenticated By: Francene Boyers, M.D.    Dg Foot Complete Right  02/20/2012  *RADIOLOGY REPORT*  Clinical Data: Pain in the right leg and foot.  History of breast cancer.  Chills.  RIGHT FOOT COMPLETE - 3+ VIEW  Comparison: 08/27/2011  Findings: There is no evidence for acute fracture or dislocation. No soft tissue foreign body or gas identified.  There is mild degenerative change in the hind foot at the first tarsometatarsal joint.  Small plantar are and Achilles spurs are identified.  IMPRESSION:  1.  Degenerative changes. 2. No evidence for acute  abnormality.   Original Report Authenticated By: Norva Pavlov, M.D.     Medications: Scheduled Meds:    . acetaminophen  1,000 mg Oral BID  . azelastine  1 spray Each Nare QHS  . enoxaparin (LOVENOX) injection  1 mg/kg Subcutaneous Q12H  . feeding supplement  237 mL Oral Q1500  . fluticasone  2 spray Each Nare Daily  . gabapentin  600 mg Oral Daily  . guaiFENesin  600 mg Oral BID  . lipase/protease/amylase  1 capsule Oral TID AC  . loratadine  10 mg Oral QHS  . magic mouthwash  5 mL Oral QID  . mometasone-formoterol  1 puff  Inhalation BID  . montelukast  10 mg Oral Daily  . multivitamin with minerals  1 tablet Oral Daily  . propylthiouracil  50 mg Oral BID  . sodium chloride  3 mL Intravenous Q12H  . warfarin  5 mg Oral q1800  . Warfarin - Pharmacist Dosing Inpatient   Does not apply q1800      LOS: 4 days   Anavictoria Wilk M.D. [facility] 02/24/2012, 12:04 PM Pager: [phone]  If 7PM-7AM, please contact night-coverage [email or web address] Password [email or web address]

## 2012-02-24 NOTE — Progress Notes (Signed)
PATIENT ID: Ebony Scott        Subjective: Reports doing well today. Told me it was found she had a DVT in left lower leg, denies calf soreness/pain. Reports that bilat knee pain and right ankle pain has significantly improved since injections. Reports left knee and right foot feel "tight" but not painful. Denies fever, chills, symptoms of infection.   Objective:  Filed Vitals:   02/24/12 0441  BP: 124/73  Pulse: 80  Temp: 97.7 F (36.5 C)  Resp: 18     Well developed well-nourished, NAD Awake alert orientatedx3 R knee with no swelling, erythema, warmth. Near full ROM with minimal discomfort. nontender to palpation R ankle with no swelling, ACE removed and reapplied. There is some mod swelling distal to placement of the ACE wrap, near toes. No erythema, warmth. Minimal tenderness to palpation. Near full ROM of ankles, limited by stiffness. Wiggles toes. L knee with some swelling, small effusion. Mild discomfort to palpation medial joint line. Near full ROM and minimal discomfort. No erythema, warmth.  No calf tenderness to palpation bilat.   Labs:   [facility] 02/24/12 0730 02/23/12 0640 02/22/12 0450  HGB 10.6* 10.4* 10.5*   Basename 02/24/12 0730 02/23/12 0640  WBC 14.8* 11.2*  RBC 3.26* 3.25*  HCT 30.8* 31.1*  PLT 306 227   Basename 02/24/12 0730 02/23/12 0640  NA 135 136  K 4.1 4.2  CL 98 99  CO2 23 23  BUN 25* 13  CREATININE 0.89 1.12*  GLUCOSE 164* 99  CALCIUM 9.1 8.3*    Assessment and Plan: Right knee, left knee and right ankle pseudogout s/p aspiration and corticosteroid injection, doing well with improvement of symptoms.  Some left knee and right foot swelling, much improved from previous but will continue to monitor. ACE reapplied WBC count up to 14.8, asymptomatic and no evidence of infxn on exam, no fever, will continue to monitor  L LE DVT- now on full dose DVT tx per primary team  VTE proph: per primary team

## 2012-02-24 NOTE — Progress Notes (Signed)
ANTICOAGULATION CONSULT NOTE -follow up  Pharmacy Consult for coumadin Indication: DVT  Allergies  Allergen Reactions  . Dust Mite Extract Shortness Of Breath and Other (See Comments)    "sneezing" (02/20/2012)  . Molds & Smuts Shortness Of Breath  . Morphine Sulfate Itching  . Other Shortness Of Breath and Other (See Comments)    Grass and weeds "sneezing; filled sinuses" (02/20/2012)  . Penicillins Rash and Other (See Comments)    "welts" (02/20/2012)  . Rofecoxib Swelling    Celebrex REACTION: feet swelling  . Shrimp Flavor Anaphylaxis    ALL SHELLFISH  . Tetracycline Hcl Nausea And Vomiting  . Dilaudid (Hydromorphone Hcl) Itching  . Hydrocodone-Acetaminophen Nausea And Vomiting  . Levofloxacin Other (See Comments)    REACTION: GI upset  . Oxycodone Hcl Nausea And Vomiting  . Paroxetine Nausea And Vomiting    Paxil   . Soy Allergy Other (See Comments)    Limited soy d/t "ESTROGEN POSITIVE" cancer  . Tree Extract Other (See Comments)    "tested and told I was allergic to it; never experienced a reaction to it" (02/20/2012)    Patient Measurements: Height: 5\' 1"  (154.9 cm) Weight: 105 lb 12.8 oz (47.991 kg) IBW/kg (Calculated) : 47.8    Vital Signs: Temp: 97.6 F (36.4 C) (01/05 1434) Temp src: Oral (01/05 1434) BP: 128/54 mmHg (01/05 1434) Pulse Rate: 71  (01/05 1434)  Labs:  Basename 02/24/12 0730 02/23/12 1748 02/23/12 0640 02/22/12 0450  HGB 10.6* -- 10.4* --  HCT 30.8* -- 31.1* 31.6*  PLT 306 -- 227 197  APTT -- -- -- --  LABPROT 13.8 14.7 -- --  INR 1.07 1.17 -- --  HEPARINUNFRC -- -- -- --  CREATININE 0.89 -- 1.12* 1.10  CKTOTAL -- -- -- --  CKMB -- -- -- --  TROPONINI -- -- -- --    Estimated Creatinine Clearance: 48.8 ml/min (by C-G formula based on Cr of 0.89).    Assessment: Ebony Scott is a 62 yo F admitted with syncope. She was started full dose LMWH and coumadin 02/23/12  for new L DVT.  She is s/p aspiration of her R knee for swelling/warmth on  02/22/12.  She had a reclast injection earlier this week prior to admission.  Her CT was negative for PE. Today is day # 2/5 of minimum required overlap for VTE treatment.  Her H/H and PLTC are stable with no reports of bleeding She watched the coumadin video and was given the coumadin education booklet last night Her INR is still baseline today which is expected after only 1 dose of coumadin.    Her coumadin score =2.    Goal of Therapy:  INR 2-3 Monitor platelets by anticoagulation protocol: Yes   Plan:  1. Coumadin 5 mg po daily 2. LMWH 1 mg/kg sq q12h per MD 3. Daily INR  Herby Abraham, Pharm.D. [phone] 02/24/2012 2:53 PM

## 2012-02-24 NOTE — Progress Notes (Signed)
Patient complained of abdominal discomfort and gas mainly on her left side.  She states that she has been actively passing gas but needed something to relieve her discomfort. MD notified and orders received.  Patient was given simethicone which she stated reduced her discomfort. Will continue to monitor.

## 2012-02-25 DIAGNOSIS — M810 Age-related osteoporosis without current pathological fracture: Secondary | ICD-10-CM

## 2012-02-25 LAB — BASIC METABOLIC PANEL
BUN: 33 mg/dL — ABNORMAL HIGH (ref 6–23)
CO2: 22 mEq/L (ref 19–32)
Calcium: 9.2 mg/dL (ref 8.4–10.5)
Chloride: 99 mEq/L (ref 96–112)
Creatinine, Ser: 0.93 mg/dL (ref 0.50–1.10)
GFR calc Af Amer: 74 mL/min — ABNORMAL LOW (ref >90)
GFR calc non Af Amer: 64 mL/min — ABNORMAL LOW (ref >90)
Glucose, Bld: 141 mg/dL — ABNORMAL HIGH (ref 70–99)
Potassium: 4.3 mEq/L (ref 3.5–5.1)
Sodium: 134 mEq/L — ABNORMAL LOW (ref 135–145)

## 2012-02-25 LAB — CBC
HCT: 30.1 % — ABNORMAL LOW (ref 36.0–46.0)
Hemoglobin: 10.2 g/dL — ABNORMAL LOW (ref 12.0–15.0)
MCH: 32.3 pg (ref 26.0–34.0)
MCHC: 33.9 g/dL (ref 30.0–36.0)
MCV: 95.3 fL (ref 78.0–100.0)
Platelets: 317 10*3/uL (ref 150–400)
RBC: 3.16 MIL/uL — ABNORMAL LOW (ref 3.87–5.11)
RDW: 13.3 % (ref 11.5–15.5)
WBC: 15.7 10*3/uL — ABNORMAL HIGH (ref 4.0–10.5)

## 2012-02-25 LAB — PROTIME-INR
INR: 1.29 (ref 0.00–1.49)
Prothrombin Time: 15.8 seconds — ABNORMAL HIGH (ref 11.6–15.2)

## 2012-02-25 MED ORDER — WARFARIN SODIUM 5 MG PO TABS
5.0000 mg | ORAL_TABLET | Freq: Once | ORAL | Status: DC
  Filled 2012-02-25: qty 1

## 2012-02-25 MED ORDER — RIVAROXABAN 15 MG PO TABS
15.0000 mg | ORAL_TABLET | Freq: Two times a day (BID) | ORAL | Status: DC
  Administered 2012-02-25 – 2012-02-26 (×2): 15 mg via ORAL
  Filled 2012-02-25 (×4): qty 1

## 2012-02-25 MED ORDER — RIVAROXABAN 20 MG PO TABS: 20.0000 mg | ORAL_TABLET | Freq: Every day | ORAL | Status: DC

## 2012-02-25 NOTE — Progress Notes (Signed)
ANTICOAGULATION CONSULT NOTE - Follow Up Consult  Pharmacy Consult for coumadin Indication: DVT  Allergies  Allergen Reactions  . Dust Mite Extract Shortness Of Breath and Other (See Comments)    "sneezing" (02/20/2012)  . Molds & Smuts Shortness Of Breath  . Morphine Sulfate Itching  . Other Shortness Of Breath and Other (See Comments)    Grass and weeds "sneezing; filled sinuses" (02/20/2012)  . Penicillins Rash and Other (See Comments)    "welts" (02/20/2012)  . Rofecoxib Swelling    Celebrex REACTION: feet swelling  . Shrimp Flavor Anaphylaxis    ALL SHELLFISH  . Tetracycline Hcl Nausea And Vomiting  . Dilaudid (Hydromorphone Hcl) Itching  . Hydrocodone-Acetaminophen Nausea And Vomiting  . Levofloxacin Other (See Comments)    REACTION: GI upset  . Oxycodone Hcl Nausea And Vomiting  . Paroxetine Nausea And Vomiting    Paxil   . Soy Allergy Other (See Comments)    Limited soy d/t "ESTROGEN POSITIVE" cancer  . Tree Extract Other (See Comments)    "tested and told I was allergic to it; never experienced a reaction to it" (02/20/2012)    Patient Measurements: Height: 5\' 1"  (154.9 cm) Weight: 105 lb 12.8 oz (47.991 kg) IBW/kg (Calculated) : 47.8    Vital Signs: Temp: 97.9 F (36.6 C) (01/06 0421) Temp src: Oral (01/06 0421) BP: 121/56 mmHg (01/06 0421) Pulse Rate: 68  (01/06 0421)  Labs:  Basename 02/25/12 0458 02/24/12 0730 02/23/12 1748 02/23/12 0640  HGB 10.2* 10.6* -- --  HCT 30.1* 30.8* -- 31.1*  PLT 317 306 -- 227  APTT -- -- -- --  LABPROT 15.8* 13.8 14.7 --  INR 1.29 1.07 1.17 --  HEPARINUNFRC -- -- -- --  CREATININE 0.93 0.89 -- 1.12*  CKTOTAL -- -- -- --  CKMB -- -- -- --  TROPONINI -- -- -- --    Estimated Creatinine Clearance: 46.7 ml/min (by C-G formula based on Cr of 0.93).  Assessment: Patient is a 62 y.o F on anticoagulation overlap day #3 for new DVT.  INR is subtherapeutic at 1.29.  Patient may be transitioned to xarelto if insurance  approves medication.  Will dose current anticoagulations for now until further orders from MD to change to xarelto.  Goal of Therapy:  INR 2-3 Monitor platelets by anticoagulation protocol: Yes   Plan:  1) coumadin 5mg  PO x1 2) no change for lovenox  Elnoria Livingston P 02/25/2012,10:51 AM

## 2012-02-25 NOTE — Progress Notes (Signed)
PATIENT ID: Ebony Scott        Subjective: Patient reports she continues to feel better. Has been up and walking to bathroom etc without problems. Denies fever, chills, or symptoms of infection. Denies knee and ankle pain.   Objective:  Filed Vitals:   02/25/12 0421  BP: 121/56  Pulse: 68  Temp: 97.9 F (36.6 C)  Resp: 18     Well developed well nourished Right and left knee with no swelling, erythema, warmth. No skin color changes. Full, painless ROM. Nontender to palpation.  Right ankle/foot with mild swelling, much improved from previous. No erythema, warmth, skin color changes. Near full ROM, painless. Nontender to palpation.  No calf tenderness Ambulates with nonantalgic gait  Labs:   Basename 02/25/12 0458 02/24/12 0730 02/23/12 0640  HGB 10.2* 10.6* 10.4*   Basename 02/25/12 0458 02/24/12 0730  WBC 15.7* 14.8*  RBC 3.16* 3.26*  HCT 30.1* 30.8*  PLT 317 306   Basename 02/25/12 0458 02/24/12 0730  NA 134* 135  K 4.3 4.1  CL 99 98  CO2 22 23  BUN 33* 25*  CREATININE 0.93 0.89  GLUCOSE 141* 164*  CALCIUM 9.2 9.1    Assessment and Plan: L and R knee and R ankle pain and swelling, likely pseudogout- much improved, ambulating in room, near full ROM, no pain Per patient will get up with PT today Continued elevated WBC, asymptomatic and with improvement of joints it is unlikely that it is related, no concern for joint infection  weightbearing as tolerated Does not need to f/u with [place], can contact with problems or concerns.  VTE proph: per primary team

## 2012-02-25 NOTE — Progress Notes (Signed)
Physical Therapy Treatment Patient Details Name: Ebony Scott MRN: [id number] DOB: [birth year] Today's Date: 02/25/2012 Time: 1518-1600 PT Time Calculation (min): 42 min  PT Assessment / Plan / Recommendation Comments on Treatment Session  Patient anxious about her current condition and dependence despite much improvement over last session.  Now able to tolerate ambulation and even stairs.  Still unsteady and complained of increased knee pain after ambulation, but with cane acceptable to d/c home with son assist for stairs and with HHPT.  Will also need seat for her tub.    Follow Up Recommendations  Home health PT;Supervision - Intermittent (assist for stairs)                 Equipment Recommendations  Cane;Other (comment) (tub seat)        Frequency Min 3X/week   Plan Discharge plan needs to be updated    Precautions / Restrictions Precautions Precautions: Fall   Pertinent Vitals/Pain 6/10 in knees after activity    Mobility  Bed Mobility Details for Bed Mobility Assistance: Not tested, patient in bathroom upon my arrival Transfers Sit to Stand: 6: Modified independent (Device/Increase time) Stand to Sit: 6: Modified independent (Device/Increase time) Ambulation/Gait Ambulation/Gait Assistance: 4: Min assist;4: Min guard Ambulation Distance (Feet): 200 Feet (and 120') Assistive device: None;Straight cane Ambulation/Gait Assistance Details: ambulated without device, unsteady so min assist provided with occasional hand held assist; then with single point cane with minguard with good sequencing and cues for posture, eyes forward. Gait Pattern: Decreased stride length;Trunk flexed;Narrow base of support General Gait Details: Patient educated on footwear, use of cane and lighting for decreased fall risk.  Also encouraged to slow down and pre plan activities so son can help her when he is there. Stairs: Yes Stairs Assistance: 4: Min assist Stair Management Technique: One rail  Left;Other (comment);Step to pattern;Forwards (and hand held on right) Number of Stairs: 13        PT Goals Acute Rehab PT Goals Pt will go Sit to Stand: with modified independence PT Goal: Sit to Stand - Progress: Met Pt will go Stand to Sit: with modified independence PT Goal: Stand to Sit - Progress: Met Pt will Ambulate: 51 - 150 feet;with supervision;with least restrictive assistive device PT Goal: Ambulate - Progress: Progressing toward goal Pt will Go Up / Down Stairs: Flight;with supervision;with rail(s) PT Goal: Up/Down Stairs - Progress: Progressing toward goal  Visit Information  Last PT Received On: 02/25/12    Subjective Data  Subjective: Think they will send me home tomorrow.  Able to walk, just unsteady.  Son will help me in, but will need to manage without him mostly.   Cognition  Overall Cognitive Status: Appears within functional limits for tasks assessed/performed Arousal/Alertness: Awake/alert Orientation Level: Appears intact for tasks assessed Behavior During Session: Anxious         End of Session PT - End of Session Equipment Utilized During Treatment: Gait belt Activity Tolerance: Patient tolerated treatment well Patient left: in bed;with call bell/phone within reach   GP     [facility] 02/25/2012, 5:28 PM [place], [place] [phone] 02/25/2012

## 2012-02-25 NOTE — Care Management Note (Addendum)
    Page 1 of 2   02/26/2012     11:55:07 AM   CARE MANAGEMENT NOTE 02/26/2012  Patient:  Ebony Scott, Ebony Scott   Account Number:  [id number]  Date Initiated:  02/24/2012  Documentation initiated by:  [facility]  Subjective/Objective Assessment:     Action/Plan:   lives alone, HH   Anticipated DC Date:  02/26/2012   Anticipated DC Plan:  HOME W HOME HEALTH SERVICES      DC Planning Services  CM consult      [facility] Choice  HOME HEALTH   Choice offered to / List presented to:  C-1 Patient        HH arranged  HH-1 RN  HH-2 PT      [facility] agency  [facility]   Status of service:  Completed, signed off Medicare Important Message given?   (If response is "NO", the following Medicare IM given date fields will be blank) Date Medicare IM given:   Date Additional Medicare IM given:    Discharge Disposition:    Per UR Regulation:    If discussed at Long Length of Stay Meetings, dates discussed:    Comments:  02/26/12 1154 debbie Lavern Maslow rn,bsn gave pt copy of approval letter from [facility] for xarelto. copay also left in chart.  02/25/12 12:40p debbie Zyla Dascenzo rn,bsn pt has 45.00 copay for xarelto but will need prior [id number] wil leave sticky not for md.he [facility] has 15mg  adn 20mg  in stock. went over list of [facility] agencies and no pref. ref to [facility] for hhrn and phyt ther eval.  02/24/2011 1830 NCM spoke to pt and she was able to provide insurance info. NCM requested Unit RN fax new insurance info down to admission. States she lives at home alone and son helps at home but he works at night. Spoke to pt about home alert bracelet in case of emergency. States she had it for her mother. Pt states she uses [employer], [phone] or [address] [facility] # [phone]. Will need to verify [facility] has Xarelto at d/c.

## 2012-02-25 NOTE — Progress Notes (Signed)
ANTICOAGULATION CONSULT NOTE - Follow Up Consult  Pharmacy Consult for xarelto Indication: DVT  Allergies  Allergen Reactions  . Dust Mite Extract Shortness Of Breath and Other (See Comments)    "sneezing" (02/20/2012)  . Molds & Smuts Shortness Of Breath  . Morphine Sulfate Itching  . Other Shortness Of Breath and Other (See Comments)    Grass and weeds "sneezing; filled sinuses" (02/20/2012)  . Penicillins Rash and Other (See Comments)    "welts" (02/20/2012)  . Rofecoxib Swelling    Celebrex REACTION: feet swelling  . Shrimp Flavor Anaphylaxis    ALL SHELLFISH  . Tetracycline Hcl Nausea And Vomiting  . Dilaudid (Hydromorphone Hcl) Itching  . Hydrocodone-Acetaminophen Nausea And Vomiting  . Levofloxacin Other (See Comments)    REACTION: GI upset  . Oxycodone Hcl Nausea And Vomiting  . Paroxetine Nausea And Vomiting    Paxil   . Soy Allergy Other (See Comments)    Limited soy d/t "ESTROGEN POSITIVE" cancer  . Tree Extract Other (See Comments)    "tested and told I was allergic to it; never experienced a reaction to it" (02/20/2012)    Patient Measurements: Height: 5\' 1"  (154.9 cm) Weight: 105 lb 12.8 oz (47.991 kg) IBW/kg (Calculated) : 47.8    Vital Signs: Temp: 98 F (36.7 C) (01/06 1343) Temp src: Oral (01/06 1343) BP: 145/69 mmHg (01/06 1343) Pulse Rate: 78  (01/06 1343)  Labs:  Basename 02/25/12 0458 02/24/12 0730 02/23/12 1748 02/23/12 0640  HGB 10.2* 10.6* -- --  HCT 30.1* 30.8* -- 31.1*  PLT 317 306 -- 227  APTT -- -- -- --  LABPROT 15.8* 13.8 14.7 --  INR 1.29 1.07 1.17 --  HEPARINUNFRC -- -- -- --  CREATININE 0.93 0.89 -- 1.12*  CKTOTAL -- -- -- --  CKMB -- -- -- --  TROPONINI -- -- -- --    Estimated Creatinine Clearance: 46.7 ml/min (by C-G formula based on Cr of 0.93).  Assessment: Patient is a 62 y.o F on coumadin/enoxaparin change to change to xarelto.  Goal of Therapy:  INR 2-3 Monitor platelets by anticoagulation protocol: Yes     Plan:  - Xarelto 15mg  bid for 3 weeks followed by 20mg  once daily (with meals) -Will discontinue lovenox and daily INRs  Harland German, Pharm D 02/25/2012 5:26 PM

## 2012-02-25 NOTE — Progress Notes (Addendum)
Patient ID: Ebony Scott  female  ZOX:[id number]    DOB: Sep 19, 1950    DOA: 02/20/2012  PCP: Leo Grosser, MD   I called the prior-auth number and patient is now approved for xarelto # [id number] approved through Jan 6th, 2015  Will start Xarelto tonight. D/w pharmacy, PT eval pending. DC in AM.   Yousof Alderman M.D. [facility] 02/25/2012, 5:01 PM  Pager: [phone]     Assessment/Plan: Principal Problem:  *Syncope: likely vasovagal - 2-D echo showed EF of 60-65% with normal wall motion  Fevers: unclear etiology, possibly viral illness, vs sec to reclast side effect, resolved - Influenza panel negative, CXR neg for PNA - ordered ESR, CRP, Urine cultures showed no growth - she also had reclast injection this week before this admission which also can cause fevers/  influenza like sx/ arthalgias/ myalgias as side effect (per patient never had these symptoms before with reclast injections)    Hyperthyroidism: - cont PTU   GERD: cont PPI   RIGHT KNEE, left knee and Rt ankle pain with effusion: feels a whole lot better, ambulating in the room - Highly appreciate Dr Veda Canning assistance, multiple joint involvement, 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 Less likely septic joint, s/p steroid injections  Left leg acute DVT - Started on Coumadin and full dose Lovenox - CT angiogram of the chest did not show any acute PE, did show right apical groundglass opacity, followup CT in 3 months. May need biopsy and further workup, placed pulmonology referral patient at the time of discharge. - Discussed with case management today for xarelto approval through her insurance. If xarelto is improved we'll start her on Xarelto (15mg  BID x 21 days-> 15mg  daily) today  DVT Prophylaxis: lovenox + coumadin currently  Code Status: FC  Disposition: tomorrow AM, start PT today    Subjective: Pain significantly improved in the knee joints, swelling down, ambulating in the room  Objective: Weight change:    Intake/Output Summary (Last 24 hours) at 02/25/12 1040 Last data filed at 02/25/12 0300  Gross per 24 hour  Intake    360 ml  Output   1100 ml  Net   -740 ml   Blood pressure 121/56, pulse 68, temperature 97.9 F (36.6 C), temperature source Oral, resp. rate 18, height 5\' 1"  (1.549 m), weight 47.991 kg (105 lb 12.8 oz), SpO2 99.00%.  Physical Exam: General: Alert and awake, oriented x3, not in any acute distress. HEENT: anicteric sclera, pupils reactive to light and accommodation, EOMI CVS: S1-S2 clear, no murmur rubs or gallops Chest: CTAB Abdomen: soft NT, ND, NBS Extremities: Bilaterally knees swelling down   Lab Results: Basic Metabolic Panel:  Lab 02/25/12 8657 02/24/12 0730  NA 134* 135  K 4.3 4.1  CL 99 98  CO2 22 23  GLUCOSE 141* 164*  BUN 33* 25*  CREATININE 0.93 0.89  CALCIUM 9.2 9.1  MG -- --  PHOS -- --   Liver Function Tests:  Lab 02/20/12 1152  AST 29  ALT 32  ALKPHOS 50  BILITOT 0.9  PROT 7.5  ALBUMIN 3.6   CBC:  Lab 02/25/12 0458 02/24/12 0730  WBC 15.7* 14.8*  NEUTROABS -- --  HGB 10.2* 10.6*  HCT 30.1* 30.8*  MCV 95.3 94.5  PLT 317 306   Cardiac Enzymes:  Lab 02/21/12 1220 02/21/12 0208 02/20/12 2013  CKTOTAL -- -- --  CKMB -- -- --  CKMBINDEX -- -- --  TROPONINI <0.30 <0.30 <0.30   BNP: No components found with this basename: POCBNP:2  CBG: No results found for this basename: GLUCAP:5 in the last 168 hours   Micro Results: Recent Results (from the past 240 hour(s))  CULTURE, BLOOD (ROUTINE X 2)     Status: Normal (Preliminary result)   Collection Time   02/21/12  4:18 PM      Component Value Range Status Comment   Specimen Description BLOOD RIGHT FOREARM   Final    Special Requests BOTTLES DRAWN AEROBIC ONLY 5CC   Final    Culture  Setup Time 02/22/2012 03:46   Final    Culture     Final    Value:        BLOOD CULTURE RECEIVED NO GROWTH TO DATE CULTURE WILL BE HELD FOR 5 DAYS BEFORE ISSUING A FINAL NEGATIVE REPORT    Report Status PENDING   Incomplete   CULTURE, BLOOD (ROUTINE X 2)     Status: Normal (Preliminary result)   Collection Time   02/21/12  4:26 PM      Component Value Range Status Comment   Specimen Description BLOOD RIGHT HAND   Final    Special Requests BOTTLES DRAWN AEROBIC ONLY 10CC   Final    Culture  Setup Time 02/21/2012 21:39   Final    Culture     Final    Value:        BLOOD CULTURE RECEIVED NO GROWTH TO DATE CULTURE WILL BE HELD FOR 5 DAYS BEFORE ISSUING A FINAL NEGATIVE REPORT   Report Status PENDING   Incomplete   URINE CULTURE     Status: Normal   Collection Time   02/22/12  4:52 PM      Component Value Range Status Comment   Specimen Description URINE, RANDOM   Final    Special Requests NONE   Final    Culture  Setup Time 02/22/2012 18:23   Final    Colony Count NO GROWTH   Final    Culture NO GROWTH   Final    Report Status 02/23/2012 FINAL   Final   BODY FLUID CULTURE     Status: Normal (Preliminary result)   Collection Time   02/22/12  5:57 PM      Component Value Range Status Comment   Specimen Description FLUID SYNOVIAL RIGHT KNEE   Final    Special Requests NONE   Final    Gram Stain     Final    Value: ABUNDANT WBC WBC PRESENT,BOTH PMN AND MONONUCLEAR     NO SQUAMOUS EPITHELIAL CELLS SEEN     NO ORGANISMS SEEN   Culture NO GROWTH 2 DAYS   Final    Report Status PENDING   Incomplete     Studies/Results: Dg Chest 2 View  02/20/2012  *RADIOLOGY REPORT*  Clinical Data: Cough and chills.  Pain down right leg and right foot.  History of breast cancer.  Symptoms began on Friday night.  CHEST - 2 VIEW  Comparison: 01/25/2012  Findings: Cardiomediastinal silhouette is within normal limits. The lungs are free of focal consolidations and pleural effusions. No pulmonary edema.  Surgical clips overlie the left anterior chest wall/breast.  Surgical clips are identified in the upper abdomen. Visualized osseous structures have a normal appearance.  IMPRESSION: No evidence for acute  cardiopulmonary abnormality.   Original Report Authenticated By: Norva Pavlov, M.D.    Dg Chest 2 View  01/25/2012  *RADIOLOGY REPORT*  Clinical Data: Cough, chest heaviness, dizziness, asthma, question pneumonia  CHEST - 2 VIEW  Comparison: 11/28/2011  Findings: Normal heart size, mediastinal contours, and pulmonary vascularity. Atherosclerotic calcification aorta. Lungs appear slightly hyperinflated with minimal peribronchial thickening likely related to patient's history of asthma. No pulmonary infiltrate, pleural effusion or pneumothorax. Surgical clips left breast. Bones appear demineralized. Prior cervical spine surgery.  IMPRESSION: No acute abnormalities.   Original Report Authenticated By: Ulyses Southward, M.D.    Dg Knee Complete 4 Views Right  02/21/2012  *RADIOLOGY REPORT*  Clinical Data: Right knee pain and swelling.  RIGHT KNEE - COMPLETE 4+ VIEW  Comparison: None.  Findings: There is extensive chondrocalcinosis.  There is a joint effusion.  There is soft tissue swelling of the distal quadriceps tendon.  There are calcifications in the posterior aspect of the joint which may represent loose bodies.  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  Joint effusion.  Abnormal enlargement of the distal quadriceps tendon.   Original Report Authenticated By: Francene Boyers, M.D.    Dg Foot Complete Right  02/20/2012  *RADIOLOGY REPORT*  Clinical Data: Pain in the right leg and foot.  History of breast cancer.  Chills.  RIGHT FOOT COMPLETE - 3+ VIEW  Comparison: 08/27/2011  Findings: There is no evidence for acute fracture or dislocation. No soft tissue foreign body or gas identified.  There is mild degenerative change in the hind foot at the first tarsometatarsal joint.  Small plantar are and Achilles spurs are identified.  IMPRESSION:  1.  Degenerative changes. 2. No evidence for acute  abnormality.   Original Report Authenticated By: Norva Pavlov, M.D.      Medications: Scheduled Meds:    . acetaminophen  1,000 mg Oral BID  . azelastine  1 spray Each Nare QHS  . enoxaparin (LOVENOX) injection  1 mg/kg Subcutaneous Q12H  . feeding supplement  237 mL Oral Q1500  . fluticasone  2 spray Each Nare Daily  . gabapentin  600 mg Oral Daily  . guaiFENesin  600 mg Oral BID  . lipase/protease/amylase  1 capsule Oral TID AC  . loratadine  10 mg Oral QHS  . magic mouthwash  5 mL Oral QID  . mometasone-formoterol  1 puff Inhalation BID  . montelukast  10 mg Oral Daily  . multivitamin with minerals  1 tablet Oral Daily  . propylthiouracil  50 mg Oral BID  . sodium chloride  3 mL Intravenous Q12H  . warfarin  5 mg Oral q1800  . Warfarin - Pharmacist Dosing Inpatient   Does not apply q1800      LOS: 5 days   Ebony Scott M.D. [facility] 02/25/2012, 10:40 AM Pager: [phone]  If 7PM-7AM, please contact night-coverage [email or web address] Password [email or web address]

## 2012-02-26 DIAGNOSIS — M1120 Other chondrocalcinosis, unspecified site: Secondary | ICD-10-CM

## 2012-02-26 LAB — BODY FLUID CULTURE: Culture: NO GROWTH

## 2012-02-26 MED ORDER — RIVAROXABAN 15 MG PO TABS: 15.0000 mg | ORAL_TABLET | Freq: Two times a day (BID) | ORAL | Status: DC

## 2012-02-26 MED ORDER — RIVAROXABAN 20 MG PO TABS: 20.0000 mg | ORAL_TABLET | Freq: Every day | ORAL | Status: DC

## 2012-02-26 MED ORDER — MECLIZINE HCL 32 MG PO TABS: 32.0000 mg | ORAL_TABLET | Freq: Three times a day (TID) | ORAL | Status: DC | PRN

## 2012-02-26 NOTE — Evaluation (Signed)
Occupational Therapy Evaluation Patient Details Name: Ebony Scott MRN: [id number] DOB: [birth year] Today's Date: 02/26/2012 Time: 2130-8657 OT Time Calculation (min): 40 min  OT Assessment / Plan / Recommendation Clinical Impression  Pleasant 62 yr old femal admitted with pseudogout in her knees and ankles.  Now has somewaht resolved and pt presents with general weakness and slight a slight decrease in dynamic balance.  Overall feel with use of a cane for balance and shower seat in her tub she could be modified independent.  No further OT needs.  Feel PT can address higher level balance deficits.    OT Assessment  Patient does not need any further OT services    Follow Up Recommendations  No OT follow up       Equipment Recommendations  Tub/shower seat          Precautions / Restrictions Precautions Precautions: Fall Restrictions Weight Bearing Restrictions: No   Pertinent Vitals/Pain Pain 2/10 in the right knee, O2 sats 97% and HR 72 BPM    ADL  Eating/Feeding: Performed;Independent Where Assessed - Eating/Feeding: Edge of bed Grooming: Performed;Supervision/safety Where Assessed - Grooming: Unsupported standing Upper Body Bathing: Simulated;Set up Where Assessed - Upper Body Bathing: Unsupported sitting Lower Body Bathing: Simulated;Set up Where Assessed - Lower Body Bathing: Unsupported sit to stand Upper Body Dressing: Simulated;Set up Where Assessed - Upper Body Dressing: Unsupported sit to stand Lower Body Dressing: Simulated;Set up Where Assessed - Lower Body Dressing: Unsupported sit to stand Toilet Transfer: Performed;Minimal assistance Toilet Transfer Method: Other (comment) (ambulate without assistive device) Toilet Transfer Equipment: Comfort height toilet Toileting - Clothing Manipulation and Hygiene: Performed;Supervision/safety Where Assessed - Toileting Clothing Manipulation and Hygiene: Sit to stand from 3-in-1 or toilet Tub/Shower Transfer: Simulated;Min  guard Tub/Shower Transfer Method: Ambulating Transfers/Ambulation Related to ADLs: Pt is overall min assist hand held for mobility in the room. ADL Comments: Pt overall supervision level for most selfcare tasks, with min assist for balance without use of assistive device.  Feel she could be modified independent with use of DME and cane.  Recommend shower seat for home.      Visit Information  Last OT Received On: 02/26/12 Assistance Needed: +1    Subjective Data  Subjective: I feel a lot better but I was dizzy last night it could have been the medicine. Patient Stated Goal: Get back to doing everything for hersellf   Prior Functioning     Home Living Lives With: Alone Available Help at Discharge: Family;Other (Comment) (son works at night) Type of Home: House Home Access: Stairs to enter Secretary/administrator of Steps: 8 Entrance Stairs-Rails: Right;Left Home Layout: Two level Alternate Level Stairs-Number of Steps: 12 Alternate Level Stairs-Rails: Right Bathroom Shower/Tub: Tub/shower unit;Curtain Bathroom Toilet: Handicapped height Bathroom Accessibility: Yes How Accessible: Accessible via walker Home Adaptive Equipment: Grab bars in shower Prior Function Level of Independence: Independent Able to Take Stairs?: Yes Driving: Yes Vocation: Retired Musician: No difficulties Dominant Hand: Right         Vision/Perception Vision - Assessment Eye Alignment: Within Functional Limits Vision Assessment: Vision not tested Perception Perception: Within Functional Limits Praxis Praxis: Intact   Cognition  Overall Cognitive Status: Appears within functional limits for tasks assessed/performed Arousal/Alertness: Awake/alert Orientation Level: Appears intact for tasks assessed Behavior During Session: [facility] for tasks performed    Extremity/Trunk Assessment Right Upper Extremity Assessment RUE ROM/Strength/Tone: Within functional levels RUE  Sensation: WFL - Light Touch RUE Coordination: WFL - gross/fine motor Left Upper Extremity Assessment LUE  ROM/Strength/Tone: Within functional levels LUE Sensation: WFL - Light Touch LUE Coordination: WFL - gross/fine motor Trunk Assessment Trunk Assessment: Normal     Mobility Bed Mobility Bed Mobility: Supine to Sit Supine to Sit: 7: Independent Transfers Transfers: Sit to Stand Sit to Stand: 6: Modified independent (Device/Increase time);Without upper extremity assist;From toilet Stand to Sit: 6: Modified independent (Device/Increase time);To toilet;Without upper extremity assist           Balance Balance Balance Assessed: Yes Dynamic Standing Balance Dynamic Standing - Balance Support: No upper extremity supported Dynamic Standing - Level of Assistance: 4: Min assist High Level Balance High Level Balance Comments: Pt needs min steady assist for quick turns.   End of Session OT - End of Session Equipment Utilized During Treatment: Gait belt Activity Tolerance: Patient tolerated treatment well Patient left: Other (comment) (Left on toilet, nursing aware) Nurse Communication: Mobility status     Marolyn Urschel OTR/L Pager number [phone] 02/26/2012, 9:08 AM

## 2012-02-26 NOTE — Progress Notes (Signed)
Pharmacy: Xarelto  Educated patient on Xarelto (indication, dosing instructions, side effects, etc).  Patient verbalized understanding.  Dorna Leitz, PharmD, BCPS

## 2012-02-26 NOTE — Progress Notes (Signed)
Discharge instructions given to pt along with prescriptions. Pt verbalized understanding. Pt is stable for discharge. Will arrange for volunteers to wheel pt out to her ride.

## 2012-02-26 NOTE — Discharge Summary (Signed)
Physician Discharge Summary  Patient ID: Ebony Scott MRN: [id number] DOB/AGE: 1950-09-13 62 y.o.  Admit date: 02/20/2012 Discharge date: 02/26/2012  Discharge Diagnoses:  Principal Problem:  *Syncope Active Problems:  HYPOTHYROIDISM  HYPERLIPIDEMIA  GERD  Left leg pain  Pseudogout  DVT left leg     Medication List     As of 02/26/2012  9:39 AM    TAKE these medications         ALIGN 4 MG Caps   Take 1 tablet by mouth daily. Once a day      Alpha-Lipoic Acid 600 MG Caps   Take 600 mg by mouth daily. Once a day      Azelastine HCl 0.15 % Soln   Place 1 spray into the nose daily.      BEANO Tabs   Take 1 tablet by mouth daily.      CITRACAL MAXIMUM 315-250 MG-UNIT Tabs   Generic drug: Calcium Citrate-Vitamin D   Take 1 tablet by mouth 2 (two) times daily. 1 capsule twice a day      CREON 24000 UNITS Cpep   Generic drug: Pancrelipase (Lip-Prot-Amyl)   Take by mouth. 1 with each meal      DULERA 100-5 MCG/ACT Aero   Generic drug: mometasone-formoterol   Inhale 1 puff into the lungs 2 (two) times daily.      EPINEPHrine 0.15 MG/0.3ML injection   Commonly known as: EPIPEN JR   Inject 0.15 mg into the muscle as needed. For reaction      gabapentin 600 MG tablet   Commonly known as: NEURONTIN   Take 600 mg by mouth daily. Once a day      GAS-X EXTRA STRENGTH 125 MG Caps   Generic drug: Simethicone   Take 2 each by mouth daily as needed. For gas relief      guaiFENesin 600 MG 12 hr tablet   Commonly known as: MUCINEX   Take 600 mg by mouth 2 (two) times daily.      lactase 3000 UNITS tablet   Commonly known as: LACTAID   Take 3 tablets by mouth as needed. 3-4 tabs with dairy liquids and solids      levocetirizine 5 MG tablet   Commonly known as: XYZAL   Take 5 mg by mouth daily.      lidocaine 5 %   Commonly known as: LIDODERM   Place 1 patch onto the skin daily as needed. Remove & Discard patch within 12 hours or as directed by MD      magic mouthwash  Soln   Take 5-10 mLs by mouth 3 (three) times daily as needed. For irritation      Melatonin 3 MG Caps   Take 9 mg by mouth at bedtime. 3 tabs at bedtime      mometasone 50 MCG/ACT nasal spray   Commonly known as: NASONEX   Place 2 sprays into the nose daily.      montelukast 10 MG tablet   Commonly known as: SINGULAIR   Take 10 mg by mouth Daily.      nystatin-triamcinolone cream   Commonly known as: MYCOLOG II   Apply 1 application topically 2 (two) times daily as needed. For irritation      propylthiouracil 50 MG tablet   Commonly known as: PTU   Take 50 mg by mouth 2 (two) times daily.      RECLAST 5 MG/100ML Soln   Generic drug: zoledronic acid   Inject 5  mg into the vein once. Once a year      Rivaroxaban 15 MG Tabs tablet   Commonly known as: XARELTO   Take 1 tablet (15 mg total) by mouth 2 (two) times daily with a meal.      Rivaroxaban 20 MG Tabs   Commonly known as: XARELTO   Take 1 tablet (20 mg total) by mouth daily with supper.      SYSTANE ULTRA 0.4-0.3 % Soln   Generic drug: Polyethyl Glycol-Propyl Glycol   Apply 1 drop to eye 2 (two) times daily as needed. 1-2 drops each eye every morning and at bedtime      TRIPLE ANTIBIOTIC ophthalmic ointment   Generic drug: neomycin-bacitracin-polymyxin   Apply as needed      TYLENOL ARTHRITIS PAIN 650 MG CR tablet   Generic drug: acetaminophen   Take 1,300 mg by mouth 2 (two) times daily.      Vinate M 27-1 MG Tabs   Take 1 tablet by mouth daily.      Vitamin D 2000 UNITS Caps   Take 0.5 capsules by mouth at bedtime. 1/2 at bedtime      XOLAIR 150 MG injection   Generic drug: omalizumab   Inject 150 mg into the skin every 28 (twenty-eight) days. 150 SQ every 4 weeks            Discharge Orders    Future Appointments: Provider: Department: Dept Phone: Center:   03/05/2012 11:00 AM [facility] [phone] None   03/05/2012 11:30 AM Storm Frisk, MD [facility] [phone] None      Disposition: home  Discharged Condition: stable  Consults: Treatment Team:  Mable Paris, MD  Labs:   Results for orders placed during the hospital encounter of 02/20/12 (from the past 48 hour(s))  CBC     Status: Abnormal   Collection Time   02/25/12  4:58 AM      Component Value Range Comment   WBC 15.7 (*) 4.0 - 10.5 K/uL    RBC 3.16 (*) 3.87 - 5.11 MIL/uL    Hemoglobin 10.2 (*) 12.0 - 15.0 g/dL    HCT 41.3 (*) 24.4 - 46.0 %    MCV 95.3  78.0 - 100.0 fL    MCH 32.3  26.0 - 34.0 pg    MCHC 33.9  30.0 - 36.0 g/dL    RDW 01.0  27.2 - 53.6 %    Platelets 317  150 - 400 K/uL   BASIC METABOLIC PANEL     Status: Abnormal   Collection Time   02/25/12  4:58 AM      Component Value Range Comment   Sodium 134 (*) 135 - 145 mEq/L    Potassium 4.3  3.5 - 5.1 mEq/L    Chloride 99  96 - 112 mEq/L    CO2 22  19 - 32 mEq/L    Glucose, Bld 141 (*) 70 - 99 mg/dL    BUN 33 (*) 6 - 23 mg/dL    Creatinine, Ser 6.44  0.50 - 1.10 mg/dL    Calcium 9.2  8.4 - 03.4 mg/dL    GFR calc non Af Amer 64 (*) >90 mL/min    GFR calc Af Amer 74 (*) >90 mL/min   PROTIME-INR     Status: Abnormal   Collection Time   02/25/12  4:58 AM      Component Value Range Comment   Prothrombin Time 15.8 (*) 11.6 -  15.2 seconds    INR 1.29  0.00 - 1.49     Diagnostics:  Dg Chest 2 View  02/20/2012  *RADIOLOGY REPORT*  Clinical Data: Cough and chills.  Pain down right leg and right foot.  History of breast cancer.  Symptoms began on Friday night.  CHEST - 2 VIEW  Comparison: 01/25/2012  Findings: Cardiomediastinal silhouette is within normal limits. The lungs are free of focal consolidations and pleural effusions. No pulmonary edema.  Surgical clips overlie the left anterior chest wall/breast.  Surgical clips are identified in the upper abdomen. Visualized osseous structures have a normal appearance.  IMPRESSION: No evidence for acute cardiopulmonary abnormality.   Original  Report Authenticated By: Norva Pavlov, M.D.    Ct Angio Chest Pe W/cm &/or Wo Cm  02/24/2012  *RADIOLOGY REPORT*  Clinical Data: Chest discomfort in a patient with confirmed deep venous thrombosis.  Prior history of breast cancer.  Current history of Graves disease.  CT ANGIOGRAPHY CHEST  Technique:  Multidetector CT imaging of the chest using the standard protocol during bolus administration of intravenous contrast. Multiplanar reconstructed images including MIPs were obtained and reviewed to evaluate the vascular anatomy.  Lung windows were reconstructed at contiguous 1 mm intervals.  Contrast: .  Comparison: CT chest 05/31/2011, 11/24/2009, 10/21/2007.  CTA chest 12/12/2009.  Findings: Contrast opacification of pulmonary arteries is very good. No filling defects within either main pulmonary artery or their branches in either lung to suggest pulmonary embolism.  Heart size upper normal and stable.  Moderate LAD coronary atherosclerosis.  Moderate atherosclerosis involving the thoracic and upper abdominal aorta without aneurysm or dissection.  Focal ground-glass opacity in the right apex measuring approximately 1.4 x 1.7 x 2.5 cm (series 5, image 13 and coronal image 68); this does appear to have a pure ground-glass appearance on the 1 mm reconstructed images.  Micronodular peribronchovascular opacity in the inferior right upper lobe in the site of the prior bronchopneumonia in April, 2013; there is less opacity on the current examination.  Similar changes in the lingula which have improved since the prior examination.  No pleural effusions.  No pulmonary parenchymal nodules or masses.  No significant mediastinal, hilar, or axillary lymphadenopathy. Visualized thyroid gland unremarkable.  Visualized upper abdomen unremarkable for the early arterial phase of enhancement.  Bone window images unremarkable.  IMPRESSION:  1.  No evidence of pulmonary embolism. 2.  Right apical ground-glass opacity with measurements  given above, new since the most recent examination from April, 2013. This is likely inflammatory.  However, adenocarcinoma cannot be excluded.  Followup CT is recommended in 3 months, with continued annual surveillance for a minimum of 3 years if it persists on that examination.  These recommendations are taken from "Recommendations for the Management of Subsolid Pulmonary Nodules Detected at CT:  A Statement from the Fleischner Society" Radiology 2013; 266:1, 304- 317.  3.  Residual slight peribronchovascular opacity in the inferior right upper lobe and in the lingula at the site of prior bronchopneumonia in April, 2013, consistent with post-inflammatory change.   Original Report Authenticated By: Hulan Saas, M.D.    Dg Knee Complete 4 Views Right  02/21/2012  *RADIOLOGY REPORT*  Clinical Data: Right knee pain and swelling.  RIGHT KNEE - COMPLETE 4+ VIEW  Comparison: None.  Findings: There is extensive chondrocalcinosis.  There is a joint effusion.  There is soft tissue swelling of the distal quadriceps tendon.  There are calcifications in the posterior aspect of the joint which may represent  loose bodies.  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  Joint effusion.  Abnormal enlargement of the distal quadriceps tendon.   Original Report Authenticated By: Francene Boyers, M.D.    Dg Foot Complete Right  02/20/2012  *RADIOLOGY REPORT*  Clinical Data: Pain in the right leg and foot.  History of breast cancer.  Chills.  RIGHT FOOT COMPLETE - 3+ VIEW  Comparison: 08/27/2011  Findings: There is no evidence for acute fracture or dislocation. No soft tissue foreign body or gas identified.  There is mild degenerative change in the hind foot at the first tarsometatarsal joint.  Small plantar are and Achilles spurs are identified.  IMPRESSION:  1.  Degenerative changes. 2. No evidence for acute  abnormality.   Original Report Authenticated By: Norva Pavlov, M.D.    Procedures:  Right  knee arthrocentesis showing INTRACELLULAR CALCIUM PYROPHOSPHATE CRYSTALS. Procedure note: Right knee corticosteroid injection  The right knee was sterily prepped and landmarks identified. A mixture of 1cc Kenalog and 7cc lidocaine 1% was injected into the joint through the superior lateral portal position without resistance. The needle was removed. Light bandage applied. Tolerated procedure well.  Procedure note: Left knee aspiration and corticosteroid injection  The left knee was sterily prepped and landmarks identified. 3 cc of lidocaine 1% was injected into the knee joint through the sup lateral portal position. A 18g needed was then introduced into the joint and 60cc of cloudy yellow fluid was aspirated. A mixture of 1cc Kenalog and 7cc lidocaine 1% was injected into the joint through the superior lateral portal position without resistance. The needle was removed. Light bandage applied, wrapped with ACE. Tolerated procedure well.  Prodecure note: Right ankle aspiration and corticosteroid injection  The right ankle was sterily prepped and landmarks identified. A 18g needed was then introduced into the joint and 5cc of cloudy yellow fluid was aspirated. A mixture of 1cc Kenalog and 5cc lidocaine 1% was injected into the joint through the lateral portal position.The needle was removed. Light bandage applied, wrapped with ACE. Tolerated procedure well.   Preliminary Doppler of the legs showed acute occlusive DVT in the left posterior tibial and peroneal veins.   echocardiogram  - Left ventricle: The cavity size was normal. Systolic function was normal. The estimated ejection fraction was in the range of 60% to 65%. Wall motion was normal; there were no regional wall motion abnormalities. Left ventricular diastolic function parameters were normal. - Aortic valve: There was no stenosis. - Mitral valve: Trivial regurgitation. - Left atrium: The atrium was mildly dilated. - Right ventricle: The  cavity size was normal. Systolic function was normal. - Pulmonary arteries: No complete TR doppler jet so unable to estimate PA systolic pressure. - Inferior vena cava: The vessel was normal in size; the respirophasic diameter changes were in the normal range (= 50%); findings are consistent with normal central venous pressure. Impressions:  - No significant abnormalities.  EKG: NSR, age indeterminate anterior infarct  Full Code   Hospital Course: See H&P for complete admission details. The patient is a 62 year old white female who presented to the emergency room with a syncopal episode. Echocardiogram showed normal ejection fraction. Ultimately she was felt to have vasovagal syncope.  During the hospitalization, she developed knee and ankle pain and fever. Orthopedics was consulted and tapped then injected the involved joints. Crystal exam was consistent with pseudogout. After injection, her pain improved. Fever workup was essentially negative. The fever may have been related to the previous week last injection  prior to admission. Patient also had Dopplers of the legs and was found to have a DVT of the left leg. She was started on xarelto by Dr. Isidoro Donning. By the time of discharge she was able to ambulate care. Her various pains had improved. She was afebrile. She had had no further syncopal episodes. Total time on the day of discharge greater than 30 minutes per  Discharge Exam:  Blood pressure 134/58, pulse 72, temperature 97.7 F (36.5 C), temperature source Oral, resp. rate 17, height 5\' 1"  (1.549 m), weight 47.991 kg (105 lb 12.8 oz), SpO2 96.00%. Lungs clear to auscultation bilaterally without wheeze rhonchi or rales Cardiovascular regular rate rhythm without murmurs calcium   Signed: Cera Rorke L 02/26/2012, 9:39 AM

## 2012-02-27 LAB — CULTURE, BLOOD (ROUTINE X 2): Culture: NO GROWTH

## 2012-02-28 LAB — CULTURE, BLOOD (ROUTINE X 2): Culture: NO GROWTH

## 2012-03-03 ENCOUNTER — Telehealth: Payer: Self-pay | Admitting: Critical Care Medicine

## 2012-03-03 NOTE — Telephone Encounter (Signed)
Pt had reaction to reclast injection an was put in hospital  Worried about getting the xolair injeciton advised pt to wait until After she see's CY and see what he wants her to do about her  xolair injeciton the patient verbally understood nothing further needed.

## 2012-03-05 ENCOUNTER — Ambulatory Visit (INDEPENDENT_AMBULATORY_CARE_PROVIDER_SITE_OTHER): Payer: Medicare Other | Admitting: Critical Care Medicine

## 2012-03-05 ENCOUNTER — Ambulatory Visit: Payer: Medicare Other

## 2012-03-05 ENCOUNTER — Encounter: Payer: Self-pay | Admitting: Critical Care Medicine

## 2012-03-05 VITALS — BP 146/78 | HR 67 | Temp 98.5°F | Ht 61.0 in | Wt 104.2 lb

## 2012-03-05 DIAGNOSIS — J019 Acute sinusitis, unspecified: Secondary | ICD-10-CM

## 2012-03-05 DIAGNOSIS — J45909 Unspecified asthma, uncomplicated: Secondary | ICD-10-CM

## 2012-03-05 MED ORDER — AZITHROMYCIN 250 MG PO TABS: 250.0000 mg | ORAL_TABLET | Freq: Every day | ORAL | Status: DC

## 2012-03-05 MED ORDER — MOMETASONE FUROATE 50 MCG/ACT NA SUSP: NASAL | Status: DC

## 2012-03-05 NOTE — Progress Notes (Signed)
Subjective:    Patient ID: Ebony Scott, female    DOB: 1950-06-03, 62 y.o.   MRN: [id number]  HPI  62 y.o.. WF atopic moderate persistent asthma, elevated IgE. Started Xolair 05/03/10  Initial Pulmonary Consultation 12/23/09  01/25/12 Acute OV  Complains of tightness/heaviness in chest, head congestion/pressure w/ yellow mucus, prod cough w/ yellow mucus x3days. No hemoptysis or fever. No edema.  Is having her carpet removed in her house  Recent labs last month with elevated IGE at ~800 and very positive RAST   03/05/2012 Pt in hosp 1/1- 1/7 Pt had reclast on 12/27, then severe chills. Assoc GI issues, R foot swollen Left leg developed DVT in hospital. Pt with sores in mouth and nose and nasal discharge. Mucus now is minimal. No yellow mucus. Pt feels dried out.  No real wheezes. No fever now. Pt notes night sweats. PFR 200- 250   Past Medical History  Diagnosis Date  . Chronic neck pain   . Allergic rhinitis   . Moderate persistent asthma     -FeV1 72% 2011, -IgE 102 2011, CT sinus Neg 2011  . GERD (gastroesophageal reflux disease)   . Hypothyroidism   . Osteoporosis     on reclast yearly  . Hiatal hernia   . Diverticulosis   . Hemorrhoids   . Asthma   . IBS (irritable bowel syndrome)   . Complication of anesthesia     "had hard time waking up from it several times" (02/20/2012)  . Hypercholesteremia   . Pneumonia 04/2011; ~ 11/2011    "double; single" (02/20/2012)  . Exertional dyspnea   . Graves disease   . Headache     "related to allergies; more at different times during the year" (02/20/2012)  . Arthritis     "mostly the hands" (02/20/2012)  . Fibromyalgia 11/2011  . Chronic lower back pain   . Depression     "some; don't take anything for it" (02/20/2012)  . Breast cancer 1998    in remission     Family History  Problem Relation Age of Onset  . Allergies Mother   . Allergies Father   . Heart disease Mother   . Heart disease Father   . Arthritis Mother   .  Arthritis Father   . Lung cancer Mother   . Colon cancer      Maternal half uncle  . Colon cancer      Maternal half aunt  . Colitis Daughter     Review of Systems Constitutional:   No  weight loss, night sweats,  Fevers, chills,  +fatigue, or  lassitude.  HEENT:   No headaches,  Difficulty swallowing,  Tooth/dental problems, or  Sore throat,                No sneezing, itching, ear ache,  +nasal congestion, post nasal drip,   CV:  No chest pain,  Orthopnea, PND, swelling in lower extremities, anasarca, dizziness, palpitations, syncope.   GI  No heartburn, indigestion, abdominal pain, nausea, vomiting, diarrhea, change in bowel habits, loss of appetite, bloody stools.   Resp:  ,  No coughing up of blood.   No chest wall deformity  Skin: no rash or lesions.  GU: no dysuria, change in color of urine, no urgency or frequency.  No flank pain, no hematuria   MS:  No joint pain or swelling.  No decreased range of motion.  No back pain.  Psych:  No change in mood or affect.  No depression or anxiety.  No memory loss.    '     Objective:   Physical Exam   BP 146/78  Pulse 67  Temp 98.5 F (36.9 C) (Oral)  Ht 5\' 1"  (1.549 m)  Wt 104 lb 3.2 oz (47.265 kg)  BMI 19.69 kg/m2  SpO2 97%  GEN: A/Ox3; pleasant , NAD, well nourished   HEENT:  [place]/AT,  EACs-clear, TMs-wnl, NOSE-clear drainage , THROAT-clear, no lesions, no postnasal drip or exudate noted.   NECK:  Supple w/ fair ROM; no JVD; normal carotid impulses w/o bruits; no thyromegaly or nodules palpated; no lymphadenopathy.  RESP  Clear  P & A; w/o, wheezes/ rales/ or rhonchi.no accessory muscle use, no dullness to percussion  CARD:  RRR, no m/r/g  , no peripheral edema, pulses intact, no cyanosis or clubbing.  GI:   Soft & nt; nml bowel sounds; no organomegaly or masses detected.  Musco: Warm bil, no deformities or joint swelling noted.   Neuro: alert, no focal deficits noted.    Skin: Warm, no lesions or  rashes   No results found.     Assessment & Plan:   Severe persistent asthma with significant atopic features Severe persistent asthma with significant atopic features and mold hypersensitivity Chronic mold exposure in the home environment Very high IgE levels noted Partial response to Xolair therapy Residual  tracheobronchitis Plan Hold nasonex for one week then resume Take azithromycin 250 mg Take two once then one daily until gone Use magic mouth wash only once daily A referral to allergy dr young will be made Resume Xolair end of January Return 2 months    Updated Medication List Outpatient Encounter Prescriptions as of 03/05/2012  Medication Sig Dispense Refill  . acetaminophen (TYLENOL ARTHRITIS PAIN) 650 MG CR tablet Take 1,300 mg by mouth 2 (two) times daily.        . Alpha-D-Galactosidase (BEANO) TABS Take 1 tablet by mouth daily.       . Alpha-Lipoic Acid 600 MG CAPS Take 600 mg by mouth daily. Once a day      . Alum & Mag Hydroxide-Simeth (MAGIC MOUTHWASH) SOLN Take 5-10 mLs by mouth 3 (three) times daily as needed. For irritation      . Azelastine HCl 0.15 % SOLN Place 1 spray into the nose daily.      . Calcium Citrate-Vitamin D (CITRACAL MAXIMUM) 315-250 MG-UNIT TABS Take 1 tablet by mouth 2 (two) times daily. 1 capsule twice a day      . Cholecalciferol (VITAMIN D) 2000 UNITS CAPS Take 0.5 capsules by mouth at bedtime. 1/2 at bedtime      . EPINEPHrine (EPIPEN JR) 0.15 MG/0.3ML injection Inject 0.15 mg into the muscle as needed. For reaction      . gabapentin (NEURONTIN) 600 MG tablet Take 600 mg by mouth daily. Once a day      . guaiFENesin (MUCINEX) 600 MG 12 hr tablet Take 600 mg by mouth 2 (two) times daily.      Marland Kitchen lactase (LACTAID) 3000 UNITS tablet Take 3 tablets by mouth as needed. 3-4 tabs with dairy liquids and solids      . levocetirizine (XYZAL) 5 MG tablet Take 5 mg by mouth daily.      Marland Kitchen lidocaine (LIDODERM) 5 % Place 1 patch onto the skin daily as  needed. Remove & Discard patch within 12 hours or as directed by MD      . meclizine (ANTIVERT) 32 MG tablet Take 1  tablet (32 mg total) by mouth 3 (three) times daily as needed.  10 tablet  0  . Melatonin 3 MG CAPS Take 9 mg by mouth at bedtime. 3 tabs at bedtime      . mometasone (NASONEX) 50 MCG/ACT nasal spray HOLD for one week then resume  17 g    . mometasone-formoterol (DULERA) 100-5 MCG/ACT AERO Inhale 1 puff into the lungs 2 (two) times daily.       . montelukast (SINGULAIR) 10 MG tablet Take 10 mg by mouth Daily.       Marland Kitchen neomycin-bacitracin-polymyxin (TRIPLE ANTIBIOTIC) ophthalmic ointment Apply as needed       . nystatin-triamcinolone (MYCOLOG II) cream Apply 1 application topically 2 (two) times daily as needed. For irritation      . omalizumab (XOLAIR) 150 MG injection Inject 150 mg into the skin every 28 (twenty-eight) days. 150 SQ every 4 weeks      . Pancrelipase, Lip-Prot-Amyl, (CREON) 24000 UNITS CPEP Take by mouth. 1 with each meal       . Polyethyl Glycol-Propyl Glycol (SYSTANE ULTRA) 0.4-0.3 % SOLN Apply 1 drop to eye 2 (two) times daily as needed. 1-2 drops each eye every morning and at bedtime      . Prenatal Vit-Sel-Fe Fum-FA (VINATE M) 27-1 MG TABS Take 1 tablet by mouth daily.      . Probiotic Product (ALIGN) 4 MG CAPS Take 1 tablet by mouth daily. Once a day      . propylthiouracil (PTU) 50 MG tablet Take 50 mg by mouth 2 (two) times daily.       . Rivaroxaban (XARELTO) 15 MG TABS tablet Take 1 tablet (15 mg total) by mouth 2 (two) times daily with a meal.  21 tablet  0  . Simethicone (GAS-X EXTRA STRENGTH) 125 MG CAPS Take 2 each by mouth daily as needed. For gas relief      . zoledronic acid (RECLAST) 5 MG/100ML SOLN Inject 5 mg into the vein once. Once a year      . [DISCONTINUED] mometasone (NASONEX) 50 MCG/ACT nasal spray Place 2 sprays into the nose daily.      Marland Kitchen azithromycin (ZITHROMAX) 250 MG tablet Take 1 tablet (250 mg total) by mouth daily. Take two once then  one daily until gone  6 each  0  . Rivaroxaban (XARELTO) 20 MG TABS Take 1 tablet (20 mg total) by mouth daily with supper.  30 tablet  0

## 2012-03-05 NOTE — Assessment & Plan Note (Signed)
Severe persistent asthma with significant atopic features and mold hypersensitivity Chronic mold exposure in the home environment Very high IgE levels noted Partial response to Xolair therapy Residual  tracheobronchitis Plan Hold nasonex for one week then resume Take azithromycin 250 mg Take two once then one daily until gone Use magic mouth wash only once daily A referral to allergy dr young will be made Resume Xolair end of January Return 2 months

## 2012-03-05 NOTE — Patient Instructions (Addendum)
Hold nasonex for one week then resume Take azithromycin 250 mg Take two once then one daily until gone Use magic mouth wash only once daily A referral to allergy dr young will be made Resume Xolair end of January Return 2 months

## 2012-03-10 ENCOUNTER — Telehealth: Payer: Self-pay | Admitting: Critical Care Medicine

## 2012-03-10 NOTE — Telephone Encounter (Signed)
No further recs:  See PCP MD Re this

## 2012-03-10 NOTE — Telephone Encounter (Signed)
Pt is aware. Nothing further was needed 

## 2012-03-10 NOTE — Telephone Encounter (Signed)
Pt was last seen on 03/06/11:  Patient Instructions     Hold nasonex for one week then resume  Take azithromycin 250 mg Take two once then one daily until gone  Use magic mouth wash only once daily  A referral to allergy dr young will be made  Resume Xolair end of January  Return 2 months    ----  Pt states she has not been using the nasonex (will restart tomorrow), finished the z pak yesterday, and used the MMW qd but the sores in her mouth and nose are not getting better.  Pt reports she is now using MMW 3-4 times daily with only a short period of relief and triple antibiotic ointment in her nose.  States she has tried peroxide for both her mouth and nose sores with no relief and states she is brushing and rinsing mouth really well after using dulera.  She would like to know if Dr. Delford Field has any further recs on these sores.  Dr. Delford Field, pls advise.  Thank you.

## 2012-03-12 ENCOUNTER — Ambulatory Visit (INDEPENDENT_AMBULATORY_CARE_PROVIDER_SITE_OTHER): Payer: Medicare Other | Admitting: Pulmonary Disease

## 2012-03-12 ENCOUNTER — Encounter: Payer: Self-pay | Admitting: Pulmonary Disease

## 2012-03-12 ENCOUNTER — Ambulatory Visit (INDEPENDENT_AMBULATORY_CARE_PROVIDER_SITE_OTHER)
Admission: RE | Admit: 2012-03-12 | Discharge: 2012-03-12 | Disposition: A | Discharge status: Home / Self Care | Payer: Medicare Other | Source: Ambulatory Visit | Attending: Pulmonary Disease | Admitting: Pulmonary Disease

## 2012-03-12 ENCOUNTER — Telehealth: Payer: Self-pay | Admitting: Critical Care Medicine

## 2012-03-12 VITALS — BP 110/60 | HR 73 | Temp 98.1°F | Ht 61.25 in | Wt 108.8 lb

## 2012-03-12 DIAGNOSIS — J189 Pneumonia, unspecified organism: Secondary | ICD-10-CM

## 2012-03-12 DIAGNOSIS — J45909 Unspecified asthma, uncomplicated: Secondary | ICD-10-CM

## 2012-03-12 DIAGNOSIS — J209 Acute bronchitis, unspecified: Secondary | ICD-10-CM

## 2012-03-12 MED ORDER — PREDNISONE 10 MG PO TABS: ORAL_TABLET | ORAL | Status: DC

## 2012-03-12 MED ORDER — CEFDINIR 300 MG PO CAPS: 600.0000 mg | ORAL_CAPSULE | Freq: Two times a day (BID) | ORAL | Status: DC

## 2012-03-12 NOTE — Telephone Encounter (Signed)
Spoke with pt She states that she is still having prod cough since the last visit with PW She states bringing up large amounts of yellow sputum, increased SOB and has discomfort in her back that makes her think she has PNA OV with KC at 4:15 pm today

## 2012-03-12 NOTE — Assessment & Plan Note (Signed)
The patient's history is most consistent with acute bronchitis, and possibly a superimposed sinusitis.  Her history really does not fit with influenza at this time.  I would like to treat her with a course of antibiotics and see if she improves.

## 2012-03-12 NOTE — Patient Instructions (Addendum)
Will treat with omnicef 300mg  take 2 each day for 7 days. Will treat with a short course of prednisone to help your breathing, Will check cxr to make sure no pneumonia Keep followup with Dr. Delford Field, but call if you are not getting better.

## 2012-03-12 NOTE — Assessment & Plan Note (Signed)
The patient does not have any acute bronchospasm on exam, but does feel like her breathing is more labored than usual.  I will check a chest x-ray today, and give her a rapid prednisone taper given the severity of her known asthma.

## 2012-03-12 NOTE — Progress Notes (Signed)
  Subjective:    Patient ID: Ebony Scott, female    DOB: 1950/09/09, 62 y.o.   MRN: [id number]  HPI The patient comes in today for an acute sick visit.  She was in the hospital earlier in the month with a questionable reaction to reclast.  She gives a one-week history of increasing chest congestion, cough with discolored mucus, as well as purulent sinus drainage and pressure.  She has been compliant with her sinus rinses, and staying on her asthma medication.  She states that her temperature has been 1-1/2 above baseline, but it is is not meet the definition of a true fever.  She does not have a sore throat, nor typical aches consistent with influenza.   Review of Systems  Constitutional: Positive for fever ( low grade) and fatigue. Negative for unexpected weight change.  HENT: Positive for nosebleeds ( d/t sores in nose), congestion, rhinorrhea, sneezing, mouth sores ( and in nose.Marland KitchenMarland KitchenUsing Magic MouthWash), postnasal drip and sinus pressure. Negative for ear pain, sore throat, trouble swallowing and dental problem.   Eyes: Negative for redness and itching.  Respiratory: Positive for cough and shortness of breath. Negative for chest tightness and wheezing.   Cardiovascular: Negative for palpitations and leg swelling.  Gastrointestinal: Negative for nausea and vomiting.  Genitourinary: Negative for dysuria.  Musculoskeletal: Positive for back pain, joint swelling and arthralgias.  Skin: Negative for rash.  Neurological: Positive for weakness. Negative for headaches.  Hematological: Does not bruise/bleed easily.  Psychiatric/Behavioral: Negative for dysphoric mood. The patient is not nervous/anxious.        Objective:   Physical Exam Well-developed female in no acute distress Nose without purulence, mild inflammation involving the nasal mucosa, but no definite crusting. Oropharynx clear, no exudates Neck without lymphadenopathy or thyromegaly Chest with totally clear breath sounds, no active  wheezing Cardiac exam with regular rate and rhythm Lower extremities without significant edema, no cyanosis Alert and oriented, moves all 4 extremities.       Assessment & Plan:

## 2012-03-13 ENCOUNTER — Telehealth: Payer: Self-pay | Admitting: Critical Care Medicine

## 2012-03-13 NOTE — Telephone Encounter (Signed)
i spoke with pt and is aware of PW response. She voiced her understanding and needed nothing further

## 2012-03-13 NOTE — Telephone Encounter (Signed)
There is no data to suggest an increase in dosing. Stay the same

## 2012-03-13 NOTE — Telephone Encounter (Signed)
Called spoke with patient, advised of 1.22.14 cxr results as stated by [facility].  Pt stated that d/t the increase in her IgE levels from 108 61yrs ago to 825 this past November, if her xolair dose should be increased.  Has pending injection next week.  Dr Delford Field please advise, thanks.

## 2012-03-14 ENCOUNTER — Telehealth: Payer: Self-pay | Admitting: Internal Medicine

## 2012-03-14 NOTE — Telephone Encounter (Signed)
Health maintenance updated

## 2012-03-18 ENCOUNTER — Ambulatory Visit: Payer: Medicare Other

## 2012-03-19 ENCOUNTER — Ambulatory Visit: Payer: Medicare Other

## 2012-03-19 ENCOUNTER — Telehealth: Payer: Self-pay | Admitting: *Deleted

## 2012-03-19 NOTE — Telephone Encounter (Signed)
Pt came in for monthly xolair on Mar 18, 2012.  Pt was asked by Tammy S the routine xolair injection questions: 1.  Have you had an insurance change? 2.  Have you been in the hospital in the past 10 days? 3.  Do you have a fever? 4.  Do you have a cough? Pt replied no to all 4 of these questions and signed the form.  Tammy S then advised pt that she was going to mix the injection and would be back to give it.  Pt did not voice any further questions or concerns until Tammy S was getting ready to give pt the injection.  Pt then stated, "Do you think I should wait after this injection since I had a severe reaction to reclast?"  Tammy S then found out that pt had a reclast injection 1-2 wks ago and had a severe reaction to it.  Pt stated to Tammy that she ran a fever for several days and was put on 2 abx bc of it.  Pt reported last fever was on Friday, Mar 14, 2012.  Tammy S then came to discuss this with Dr. Delford Field and myself.  Per Dr. Delford Field, pt needed to wait 1 more wk before getting the xolair injection.  Tammy S notified pt of this and scheduled pt to come back in on Tues, Mar 25, 2012 for xolair injection.  Xolair was mixed but was not given.

## 2012-03-21 ENCOUNTER — Telehealth: Payer: Self-pay | Admitting: Critical Care Medicine

## 2012-03-21 NOTE — Telephone Encounter (Signed)
Postpone xolair x 2 more weeks

## 2012-03-21 NOTE — Telephone Encounter (Signed)
Called and spoke with pt and she is aware of PW recs to hold the xolair x 2 wks.   Pt stated that she has appt with CY on 2/11 and she will schedule her xolair on 2-12.  Pt voiced her understanding and nothing further is needed.

## 2012-03-21 NOTE — Telephone Encounter (Signed)
I spoke with pt and she stated she is due for xolair injection next week. She is wanting to know if she should post pone this bc she feels she is still having a reaction to the reclast. C/o body aches, cough, feels exhausted, temp ranges from 98.5-99.0. Please advise Dr. Delford Field thanks

## 2012-03-25 ENCOUNTER — Ambulatory Visit: Payer: Medicare Other

## 2012-04-01 ENCOUNTER — Ambulatory Visit (INDEPENDENT_AMBULATORY_CARE_PROVIDER_SITE_OTHER): Payer: Medicare Other | Admitting: Internal Medicine

## 2012-04-01 ENCOUNTER — Encounter: Payer: Self-pay | Admitting: Internal Medicine

## 2012-04-01 ENCOUNTER — Other Ambulatory Visit (INDEPENDENT_AMBULATORY_CARE_PROVIDER_SITE_OTHER): Payer: Medicare Other

## 2012-04-01 ENCOUNTER — Ambulatory Visit: Payer: Medicare Other

## 2012-04-01 VITALS — BP 142/62 | HR 73 | Ht 61.25 in | Wt 111.8 lb

## 2012-04-01 DIAGNOSIS — J45909 Unspecified asthma, uncomplicated: Secondary | ICD-10-CM

## 2012-04-01 LAB — CBC WITH DIFFERENTIAL/PLATELET
Basophils Absolute: 0 10*3/uL (ref 0.0–0.1)
Basophils Relative: 0.3 % (ref 0.0–3.0)
Eosinophils Absolute: 0.2 10*3/uL (ref 0.0–0.7)
Eosinophils Relative: 2.6 % (ref 0.0–5.0)
HCT: 33.7 % — ABNORMAL LOW (ref 36.0–46.0)
Hemoglobin: 11.3 g/dL — ABNORMAL LOW (ref 12.0–15.0)
Lymphocytes Relative: 30.2 % (ref 12.0–46.0)
Lymphs Abs: 2.1 10*3/uL (ref 0.7–4.0)
MCHC: 33.6 g/dL (ref 30.0–36.0)
MCV: 99.2 fl (ref 78.0–100.0)
Monocytes Absolute: 0.8 10*3/uL (ref 0.1–1.0)
Monocytes Relative: 11.6 % (ref 3.0–12.0)
Neutro Abs: 3.8 10*3/uL (ref 1.4–7.7)
Neutrophils Relative %: 55.3 % (ref 43.0–77.0)
Platelets: 221 10*3/uL (ref 150.0–400.0)
RBC: 3.4 Mil/uL — ABNORMAL LOW (ref 3.87–5.11)
RDW: 16 % — ABNORMAL HIGH (ref 11.5–14.6)
WBC: 6.8 10*3/uL (ref 4.5–10.5)

## 2012-04-01 NOTE — Progress Notes (Signed)
Quick Note:  Spoke with patient-aware of results. ______ 

## 2012-04-01 NOTE — Patient Instructions (Addendum)
Order- lab- Allergy Profile, CBC w diff                    Dx allergic asthma  Order- Schedule PFT  I suggest you stay off Xolair now and we will watch as the season progresses, till next office visit with me.  You and Dr Delford Field should talk about whether a follow-up CT chest is needed for the Right Upper Lobe density seen in January.

## 2012-04-01 NOTE — Progress Notes (Signed)
04/01/12- 18 yoF never smoker referred courtesy of Dr Delford Field for allergy evaluation  Has been tested previously by Dr Elesa Massed in early 80's([facility]); Dr Charolotte Eke in [place](took 14 shots per week for allergies) Atopic moderate persistent asthma, elevated IgE. Started Xolair 05/03/10 She refers to allergies and asthma diagnosed 12/23/2009 with elevated IgE. She is trying to avoid all allergens she is aware of. Perennial rhinitis-"  mold -the old school and  her church is musty". Elevated IgE. She stays in. Had mold in the furnace so had ducts cleaned, carpet removed, hardwood floor. Complains of feeling exhausted, short of breath with easy fatigue. Hospitalized in December 2013 for reaction attributed to Reclast with chills, fever, foot and knee pains, diarrhea, vomiting, knee effusion diagnosis pseudogout, DVT. Chronic antihistamines. Was on Xolair for 2 years question helped, stopped 2 months ago.. Afraid to go out of the house or to be in crowds. No recent wheeze. Normal spirometry 10/01/2010. Complains of shortness of breath, not wheeze. Using Dulera 1puff twice daily with spacer. Allergy Profile 01/02/2012-total IgE 825.8 with specific elevations for all common allergens tested. Fungal Antibodies 01/02/2012-negative for fungal allergens  Prior to Admission medications   Medication Sig Start Date End Date Taking? Authorizing Provider  acetaminophen (TYLENOL ARTHRITIS PAIN) 650 MG CR tablet Take 1,300 mg by mouth 2 (two) times daily.     Yes Historical Provider, MD  Alpha-D-Galactosidase Charlyne Quale) TABS Take 1 tablet by mouth daily.    Yes Historical Provider, MD  Alpha-Lipoic Acid 600 MG CAPS Take 600 mg by mouth daily. Once a day   Yes Historical Provider, MD  Alum & Mag Hydroxide-Simeth (MAGIC MOUTHWASH) SOLN Take 5-10 mLs by mouth 3 (three) times daily as needed. For irritation 01/02/12  Yes Storm Frisk, MD  Azelastine HCl 0.15 % SOLN Place 1 spray into the nose daily. 05/04/11  Yes  Storm Frisk, MD  Calcium Citrate-Vitamin D (CITRACAL MAXIMUM) 315-250 MG-UNIT TABS Take 1 tablet by mouth 2 (two) times daily. 1 capsule twice a day   Yes Historical Provider, MD  Cholecalciferol (VITAMIN D) 2000 UNITS CAPS Take 0.5 capsules by mouth at bedtime. 1/2 at bedtime   Yes Historical Provider, MD  EPINEPHrine (EPIPEN JR) 0.15 MG/0.3ML injection Inject 0.15 mg into the muscle as needed. For reaction   Yes Historical Provider, MD  gabapentin (NEURONTIN) 600 MG tablet Take 600 mg by mouth daily. Once a day   Yes Historical Provider, MD  guaiFENesin (MUCINEX) 600 MG 12 hr tablet Take 600 mg by mouth 2 (two) times daily. 11/28/11  Yes Storm Frisk, MD  lactase (LACTAID) 3000 UNITS tablet Take 3 tablets by mouth as needed. 3-4 tabs with dairy liquids and solids   Yes Historical Provider, MD  levocetirizine (XYZAL) 5 MG tablet Take 5 mg by mouth daily.   Yes Historical Provider, MD  lidocaine (LIDODERM) 5 % Place 1 patch onto the skin daily as needed. Remove & Discard patch within 12 hours or as directed by MD   Yes Historical Provider, MD  Melatonin 3 MG CAPS Take 9 mg by mouth at bedtime. 3 tabs at bedtime   Yes Historical Provider, MD  mometasone-formoterol (DULERA) 100-5 MCG/ACT AERO Inhale 1 puff into the lungs 2 (two) times daily.  10/03/11  Yes Storm Frisk, MD  montelukast (SINGULAIR) 10 MG tablet Take 10 mg by mouth Daily.  08/08/11  Yes Historical Provider, MD  neomycin-bacitracin-polymyxin (NEOSPORIN) ointment Apply 1 application topically every 12 (twelve) hours. apply to eye  Yes Historical Provider, MD  nystatin-triamcinolone (MYCOLOG II) cream Apply 1 application topically 2 (two) times daily as needed. For irritation   Yes Historical Provider, MD  Pancrelipase, Lip-Prot-Amyl, (CREON) 24000 UNITS CPEP Take by mouth. 1 with each meal    Yes Historical Provider, MD  Polyethyl Glycol-Propyl Glycol (SYSTANE ULTRA) 0.4-0.3 % SOLN Apply 1 drop to eye 2 (two) times daily as  needed. 1-2 drops each eye every morning and at bedtime   Yes Historical Provider, MD  Prenatal Vit-Sel-Fe Fum-FA (VINATE M) 27-1 MG TABS Take 1 tablet by mouth daily.   Yes Historical Provider, MD  Probiotic Product (ALIGN) 4 MG CAPS Take 1 tablet by mouth daily. Once a day   Yes Historical Provider, MD  PROCTOZONE-HC 2.5 % rectal cream Apply as needed as directed 03/27/12  Yes Historical Provider, MD  propylthiouracil (PTU) 50 MG tablet Take 50 mg by mouth 2 (two) times daily.    Yes Historical Provider, MD  Rivaroxaban (XARELTO) 20 MG TABS Take 1 tablet (20 mg total) by mouth daily with supper. 02/26/12  Yes Christiane Ha, MD  Simethicone (GAS-X EXTRA STRENGTH) 125 MG CAPS Take 2 each by mouth daily as needed. For gas relief   Yes Historical Provider, MD  mometasone (NASONEX) 50 MCG/ACT nasal spray HOLD for one week then resume 03/05/12   Storm Frisk, MD  omalizumab Geoffry Paradise) 150 MG injection Inject 150 mg into the skin every 28 (twenty-eight) days. 150 SQ every 4 weeks 05/03/10   Historical Provider, MD   Past Medical History  Diagnosis Date  . Chronic neck pain   . Allergic rhinitis   . Moderate persistent asthma     -FeV1 72% 2011, -IgE 102 2011, CT sinus Neg 2011  . GERD (gastroesophageal reflux disease)   . Hypothyroidism   . Osteoporosis     on reclast yearly  . Hiatal hernia   . Diverticulosis   . Hemorrhoids   . Asthma   . IBS (irritable bowel syndrome)   . Complication of anesthesia     "had hard time waking up from it several times" (02/20/2012)  . Hypercholesteremia   . Pneumonia 04/2011; ~ 11/2011    "double; single" (02/20/2012)  . Exertional dyspnea   . Graves disease   . Headache     "related to allergies; more at different times during the year" (02/20/2012)  . Arthritis     "mostly the hands" (02/20/2012)  . Fibromyalgia 11/2011  . Chronic lower back pain   . Depression     "some; don't take anything for it" (02/20/2012)  . Breast cancer 1998    in remission    Past Surgical History  Procedure Laterality Date  . Cervical fusion  2003    C3-C4  . Cervical discectomy  10/2001    C5-C6  . Vesicovaginal fistula closure w/ tah  1988  . Nasal septum surgery  1980's  . Anterior and posterior repair  1990's  . Breast lumpectomy  1998    left  . Debridement tennis elbow  ?1970's    right  . Knee arthroplasty  ?1990's    "?right; w/cartilage repair" (02/20/2012)  . Carpometacarpel ([facility]) fusion of thumb with autograft from radius  ~ 2009    "both thumbs" (02/20/2012)  . Cataract extraction w/ intraocular lens  implant, bilateral  2012  . Tonsillectomy  ~ 1953  . Posterior cervical fusion/foraminotomy  2004    "failed initial fusion; rewired  anterior neck" (02/20/2012)  Family History  Problem Relation Age of Onset  . Allergies Mother   . Allergies Father   . Heart disease Mother   . Heart disease Father   . Arthritis Mother   . Arthritis Father   . Lung cancer Mother   . Colon cancer      Maternal half uncle  . Colon cancer      Maternal half aunt  . Colitis Daughter    History   Social History  . Marital Status: Divorced    Spouse Name: N/A    Number of Children: N/A  . Years of Education: N/A   Occupational History  . Disabled     Retired Audiological scientist   Social History Main Topics  . Smoking status: Never Smoker   . Smokeless tobacco: Never Used  . Alcohol Use: 0.0 oz/week     Comment: 02/20/2012 "couple glasses of wine/6 months"  . Drug Use: No  . Sexually Active: No   Other Topics Concern  . Not on file   Social History Narrative  . No narrative on file   ROS-see HPI Constitutional:   No-   weight loss, night sweats, fevers, chills, +fatigue, +lassitude. HEENT:   No-  headaches, difficulty swallowing, tooth/dental problems, sore throat,       + sneezing, itching, +ear ache, +nasal congestion, post nasal drip,  CV:  No-   chest pain, orthopnea, PND, swelling in lower extremities, anasarca,                                   dizziness, palpitations Resp: +  shortness of breath with exertion or at rest.              +  productive cough,  No non-productive cough,  No- coughing up of blood.              No-   change in color of mucus.  No- wheezing.   Skin: No-   rash or lesions. GI:  No-   heartburn, indigestion, abdominal pain, nausea, vomiting, diarrhea,                 change in bowel habits, loss of appetite GU: No-   dysuria, change in color of urine, no urgency or frequency.  No- flank pain. MS:  No-   joint pain or swelling.  No- decreased range of motion.  No- back pain. Neuro-     nothing unusual Psych:  No- change in mood or affect. No depression or anxiety.  No memory loss.  OBJ- Physical Exam General- Alert, Oriented, Affect-appropriate, Distress- none acute. Medium build. Skin- rash-none, lesions- none, excoriation- none Lymphadenopathy- none Head- atraumatic            Eyes- Gross vision intact, PERRLA, conjunctivae and secretions clear            Ears- Hearing, canals-normal            Nose- Clear, no-Septal dev, mucus, polyps, erosion, perforation             Throat- Mallampati II , mucosa clear , drainage- none, tonsils- atrophic Neck- flexible , trachea midline, no stridor , thyroid nl, carotid no bruit Chest - symmetrical excursion , unlabored           Heart/CV- RRR , no murmur , no gallop  , no rub, nl s1 s2                           -  JVD- none , edema- none, stasis changes- none, varices- none           Lung- clear to P&A, wheeze- none, +mild dry cough , dullness-none, rub- none           Chest wall-  Abd- tender-no, distended-no, bowel sounds-present, HSM- no Br/ Gen/ Rectal- Not done, not indicated Extrem- cyanosis- none, clubbing, none, atrophy- none, strength- nl Neuro- grossly intact to observation

## 2012-04-02 LAB — ALLERGY FULL PROFILE
Allergen, D pternoyssinus,d7: <0.1 kU/L
Allergen,Goose feathers, e70: <0.1 kU/L
Alternaria Alternata: <0.1 kU/L
Aspergillus fumigatus, m3: 1.92 kU/L — ABNORMAL HIGH
Bahia Grass: <0.1 kU/L
Bermuda Grass: <0.1 kU/L
Box Elder IgE: <0.1 kU/L
Candida Albicans: <0.1 kU/L
Cat Dander: <0.1 kU/L
Common Ragweed: 0.14 kU/L — ABNORMAL HIGH
Curvularia lunata: <0.1 kU/L
D. farinae: <0.1 kU/L
Dog Dander: <0.1 kU/L
Elm IgE: <0.1 kU/L
Fescue: <0.1 kU/L
G005 Rye, Perennial: <0.1 kU/L
G009 Red Top: <0.1 kU/L
Goldenrod: <0.1 kU/L
Helminthosporium halodes: <0.1 kU/L
House Dust Hollister: <0.1 kU/L
IgE (Immunoglobulin E), Serum: 409.9 IU/mL — ABNORMAL HIGH (ref 0.0–180.0)
Lamb's Quarters: <0.1 kU/L
Oak: <0.1 kU/L
Plantain: 0.11 kU/L — ABNORMAL HIGH
Stemphylium Botryosum: <0.1 kU/L
Sycamore Tree: <0.1 kU/L
Timothy Grass: <0.1 kU/L

## 2012-04-06 ENCOUNTER — Encounter: Payer: Self-pay | Admitting: Internal Medicine

## 2012-04-06 NOTE — Assessment & Plan Note (Signed)
Physical exam today is less impressive than her description of symptoms or her measured elevated IgE levels. I think her elevated IgE may have been measuring Xolair-bound IgE. Plan-PFT, allergy profile after 2 months off of Xolair for reassessment.

## 2012-04-08 ENCOUNTER — Telehealth: Payer: Self-pay | Admitting: *Deleted

## 2012-04-08 NOTE — Progress Notes (Signed)
Quick Note:  lmomtcb on pt's home and cell #s ______ 

## 2012-04-08 NOTE — Progress Notes (Signed)
Quick Note:  Spoke with pt regarding lab results and recs per Dr. Delford Field. She verbalized understanding of this and had further questions on xolair. I have created a phone msg to address this. ______

## 2012-04-08 NOTE — Telephone Encounter (Signed)
Spoke with pt to inform her of lab results per Dr. Delford Field:  Notes Recorded by Storm Frisk, MD on 04/03/2012 at 1:58 PM Call pt and tell her allergy testing still shows mold allergy, May need to stay on xolair therapy   ------  Pt states it has been 2 months since her last xoliar injection and would like to know if she should go ahead and get another one.  She would like to get it this coming Thursday when here for PFT if she needs it.  Dr. Delford Field, pls advise.  Thank you.

## 2012-04-08 NOTE — Telephone Encounter (Signed)
She needs to restart Xolair

## 2012-04-09 NOTE — Telephone Encounter (Signed)
Called, spoke with pt.  Informed her PW states she does need to restart Xolair.  Pt would like to get this done tomorrow.  I have placed her of xolair scheduled.  She is aware and voiced no further questions or concerns at this time.

## 2012-04-10 ENCOUNTER — Ambulatory Visit (INDEPENDENT_AMBULATORY_CARE_PROVIDER_SITE_OTHER): Payer: Medicare Other

## 2012-04-10 ENCOUNTER — Ambulatory Visit (INDEPENDENT_AMBULATORY_CARE_PROVIDER_SITE_OTHER): Payer: Medicare Other | Admitting: Critical Care Medicine

## 2012-04-10 DIAGNOSIS — J45909 Unspecified asthma, uncomplicated: Secondary | ICD-10-CM

## 2012-04-10 LAB — PULMONARY FUNCTION TEST

## 2012-04-10 MED ORDER — OMALIZUMAB 150 MG ~~LOC~~ SOLR
150.0000 mg | Freq: Once | SUBCUTANEOUS | Status: AC
  Administered 2012-04-10: 150 mg via SUBCUTANEOUS

## 2012-04-10 NOTE — Progress Notes (Signed)
PFT done today. 

## 2012-04-17 ENCOUNTER — Telehealth: Payer: Self-pay | Admitting: Critical Care Medicine

## 2012-04-17 DIAGNOSIS — J45909 Unspecified asthma, uncomplicated: Secondary | ICD-10-CM

## 2012-04-17 NOTE — Telephone Encounter (Signed)
Call pt and tell her PFTs show obstruction in airways due to asthma Stay on all therapies as prescribed

## 2012-04-17 NOTE — Telephone Encounter (Signed)
Spoke with pt and notified of results per Dr. Wright. Pt verbalized understanding and denied any questions.  

## 2012-04-17 NOTE — Telephone Encounter (Signed)
LMTCB

## 2012-04-18 ENCOUNTER — Encounter: Payer: Self-pay | Admitting: Family Medicine

## 2012-04-22 ENCOUNTER — Encounter: Payer: Self-pay | Admitting: Critical Care Medicine

## 2012-04-22 ENCOUNTER — Encounter: Payer: Self-pay | Admitting: Family Medicine

## 2012-04-22 NOTE — Telephone Encounter (Signed)
PW  Pt sent in an email and wanted to know about the reclast and she had some questions. i did reply to the pt and she is aware that you are out of the office until 04/28/12.  Please advise on the following:  Pt wanted to know if there are any particular problems with reclast? Also wanted to know why she would have a reaction that sent her to the hospital after her 3rd infusion.    Please advise. thanks

## 2012-04-25 NOTE — Telephone Encounter (Signed)
Spoke with Altamease Oiler, he has already spoken with patient regarding Reclast

## 2012-04-28 ENCOUNTER — Encounter: Payer: Self-pay | Admitting: Critical Care Medicine

## 2012-05-02 ENCOUNTER — Encounter: Payer: Self-pay | Admitting: Critical Care Medicine

## 2012-05-02 ENCOUNTER — Ambulatory Visit (INDEPENDENT_AMBULATORY_CARE_PROVIDER_SITE_OTHER): Payer: Medicare Other | Admitting: Critical Care Medicine

## 2012-05-02 VITALS — BP 118/72 | HR 80 | Temp 97.5°F | Ht 61.25 in | Wt 111.0 lb

## 2012-05-02 DIAGNOSIS — J189 Pneumonia, unspecified organism: Secondary | ICD-10-CM

## 2012-05-02 DIAGNOSIS — J45909 Unspecified asthma, uncomplicated: Secondary | ICD-10-CM

## 2012-05-02 MED ORDER — EPINEPHRINE 0.15 MG/0.3ML IJ DEVI: 0.1500 mg | INTRAMUSCULAR | Status: DC | PRN

## 2012-05-02 NOTE — Patient Instructions (Addendum)
Stay on Xolair No other medication changes Return 3 months CT Scan in April 2014
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
## 2012-05-03 NOTE — Assessment & Plan Note (Addendum)
severe persistent asthma with atopic features. Mold exposure, high IgE, mold hyper sensitivity Lung nodule needs f/u CT Scan Plan  no change in inhaled meds Cont xolair F/u Ct chest

## 2012-05-06 ENCOUNTER — Encounter: Payer: Self-pay | Admitting: Internal Medicine

## 2012-05-06 ENCOUNTER — Ambulatory Visit (INDEPENDENT_AMBULATORY_CARE_PROVIDER_SITE_OTHER): Payer: Medicare Other | Admitting: Internal Medicine

## 2012-05-06 VITALS — BP 110/68 | Ht 61.25 in | Wt 111.8 lb

## 2012-05-06 DIAGNOSIS — J3089 Other allergic rhinitis: Secondary | ICD-10-CM

## 2012-05-06 DIAGNOSIS — J309 Allergic rhinitis, unspecified: Secondary | ICD-10-CM

## 2012-05-06 DIAGNOSIS — J302 Other seasonal allergic rhinitis: Secondary | ICD-10-CM

## 2012-05-06 DIAGNOSIS — J45909 Unspecified asthma, uncomplicated: Secondary | ICD-10-CM
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
## 2012-05-06 NOTE — Patient Instructions (Addendum)
Watch for water damage/ mold in your home.  Follow up with Dr Delford Field on the PFT and CT scan results.  Agree with dust mask for house cleaning and dusty yard work  Agree with continuing Xolair  Try a nasal decongestant occasionally, like Sudafed-PE/ phenylephrine otc, for sinus pressure and headache

## 2012-05-08 ENCOUNTER — Ambulatory Visit (INDEPENDENT_AMBULATORY_CARE_PROVIDER_SITE_OTHER): Payer: Medicare Other

## 2012-05-08 DIAGNOSIS — J45909 Unspecified asthma, uncomplicated: Secondary | ICD-10-CM

## 2012-05-09 ENCOUNTER — Encounter: Payer: Self-pay | Admitting: Family Medicine

## 2012-05-09 ENCOUNTER — Ambulatory Visit (INDEPENDENT_AMBULATORY_CARE_PROVIDER_SITE_OTHER): Payer: Medicare Other | Admitting: Family Medicine

## 2012-05-09 VITALS — BP 120/70 | HR 72 | Temp 98.1°F | Resp 16 | Wt 110.0 lb

## 2012-05-09 DIAGNOSIS — E039 Hypothyroidism, unspecified: Secondary | ICD-10-CM

## 2012-05-09 DIAGNOSIS — E785 Hyperlipidemia, unspecified: Secondary | ICD-10-CM

## 2012-05-09 DIAGNOSIS — E0590 Thyrotoxicosis, unspecified without thyrotoxic crisis or storm: Secondary | ICD-10-CM

## 2012-05-09 DIAGNOSIS — I82409 Acute embolism and thrombosis of unspecified deep veins of unspecified lower extremity: Secondary | ICD-10-CM

## 2012-05-09 DIAGNOSIS — M11871 Other specified crystal arthropathies, right ankle and foot: Secondary | ICD-10-CM

## 2012-05-09 DIAGNOSIS — M11279 Other chondrocalcinosis, unspecified ankle and foot: Secondary | ICD-10-CM

## 2012-05-09 DIAGNOSIS — I82402 Acute embolism and thrombosis of unspecified deep veins of left lower extremity: Secondary | ICD-10-CM

## 2012-05-09 MED ORDER — OMALIZUMAB 150 MG ~~LOC~~ SOLR
150.0000 mg | Freq: Once | SUBCUTANEOUS | Status: AC
  Administered 2012-05-09: 150 mg via SUBCUTANEOUS

## 2012-05-09 NOTE — Progress Notes (Signed)
Subjective:     Patient ID: Ebony Scott, female   DOB: 1950/10/15, 62 y.o.   MRN: [id number]  HPI Pleasant 62 year old female here to discuss several medical problems #1 DVT-she was diagnosed with a left leg DVT during hospitalization in January of 2014.  She is on 3 months so xarelto 20 mg by mouth daily.  She is having occasional hematochezia.  However guaiac stool cards are negative x3 at the end of February.  She will complete therapy for DVT in the first week of April.  She denies chest pain or shortness of breath. #2 pseudogout-  during that hospitalization she developed an acute pseudogout attack in the right knee and right ankle.  Was confirmed with joint aspiration.  She has continued to have pain in the right ankle ever since.  The pain is mild, made worse with prolonged standing or walking.  She is currently on no therapy or preventative for pseudogout. #3 hyperthyroidism-she is currently on PTU 50 mg twice a day.  She denies any palpitations tachycardia or increased anxiety. #4 hyperlipidemia-we discontinued Crestor approximately 6 months ago, been monitoring her diet ever since and is due to recheck a CMP and fasting lipid panel now.  Review of Systems  Constitutional: Positive for fever, chills and fatigue. Negative for activity change.  HENT: Positive for congestion, facial swelling, rhinorrhea and postnasal drip.   Eyes: Negative.   Respiratory: Negative.   Cardiovascular: Negative.   Gastrointestinal: Negative.   Musculoskeletal: Positive for joint swelling and arthralgias. Negative for myalgias.  Allergic/Immunologic: Positive for immunocompromised state.       Objective:   Physical Exam  Constitutional: She appears well-developed and well-nourished.  HENT:  Head: Normocephalic and atraumatic.  Eyes: Conjunctivae and EOM are normal. Pupils are equal, round, and reactive to light.  Neck: Normal range of motion. Neck supple. No thyromegaly present.  Cardiovascular: Normal  rate, regular rhythm and normal heart sounds.   No murmur heard. Pulmonary/Chest: Effort normal and breath sounds normal.  Abdominal: Soft.  Musculoskeletal: Normal range of motion. She exhibits no edema and no tenderness.       Assessment:     Hyperthyroidism Pseudogout Hyperlipidemia Trace leg edema     Plan:    1. Pseudogout of ankle or foot, right Discussed daily prevention with colchicine.  However I warned patient about GI side effects including nausea vomiting and diarrhea.  She elects to take no therapy at the present time.  She'll use when necessary naproxen - CBC with Differential; Future  2. Other and unspecified hyperlipidemia Goal LDL is less than 130 - Basic Metabolic Panel; Future - Hepatic Function Panel; Future - Lipid Panel; Future  3. DVT (deep venous thrombosis), left Discontinues xarelto in April having successfully completed 3 months of anti-coagulation. - CBC with Differential; Future  4. Unspecified hypothyroidism  - TSH; Future Patient has hyperthyroidism managed with PTU 50 mg by mouth twice a day.  She is asymptomatic we will follow the TSH to assure proper dose.

## 2012-05-10 NOTE — Assessment & Plan Note (Signed)
Not sure of the significance of elevated aspergillus IgE but question possibility of chronic allergic fungal sinusitis. Okay to try off and on antihistamines and decongestants as discussed

## 2012-05-10 NOTE — Assessment & Plan Note (Signed)
Consider possibility of allergic bronchopulmonary aspergillosis noting elevated Aspergillus IgE. Pending followup chest CT.

## 2012-05-14 ENCOUNTER — Encounter: Payer: Self-pay | Admitting: Critical Care Medicine

## 2012-05-22 ENCOUNTER — Other Ambulatory Visit: Payer: Self-pay | Admitting: Obstetrics and Gynecology

## 2012-05-22 DIAGNOSIS — M8580 Other specified disorders of bone density and structure, unspecified site: Secondary | ICD-10-CM

## 2012-05-22 DIAGNOSIS — Z9889 Other specified postprocedural states: Secondary | ICD-10-CM

## 2012-05-22 DIAGNOSIS — M810 Age-related osteoporosis without current pathological fracture: Secondary | ICD-10-CM

## 2012-05-22 DIAGNOSIS — Z853 Personal history of malignant neoplasm of breast: Secondary | ICD-10-CM

## 2012-05-22 DIAGNOSIS — Z1231 Encounter for screening mammogram for malignant neoplasm of breast: Secondary | ICD-10-CM

## 2012-05-26 ENCOUNTER — Encounter: Payer: Self-pay | Admitting: Family Medicine

## 2012-06-05 ENCOUNTER — Ambulatory Visit (INDEPENDENT_AMBULATORY_CARE_PROVIDER_SITE_OTHER)
Admission: RE | Admit: 2012-06-05 | Discharge: 2012-06-05 | Disposition: A | Discharge status: Home / Self Care | Payer: Medicare Other | Source: Ambulatory Visit | Attending: Critical Care Medicine | Admitting: Critical Care Medicine

## 2012-06-05 ENCOUNTER — Other Ambulatory Visit (INDEPENDENT_AMBULATORY_CARE_PROVIDER_SITE_OTHER): Payer: Medicare Other

## 2012-06-05 ENCOUNTER — Telehealth: Payer: Self-pay | Admitting: Critical Care Medicine

## 2012-06-05 DIAGNOSIS — M11871 Other specified crystal arthropathies, right ankle and foot: Secondary | ICD-10-CM

## 2012-06-05 DIAGNOSIS — E039 Hypothyroidism, unspecified: Secondary | ICD-10-CM

## 2012-06-05 DIAGNOSIS — Z0000 Encounter for general adult medical examination without abnormal findings: Secondary | ICD-10-CM

## 2012-06-05 DIAGNOSIS — J45909 Unspecified asthma, uncomplicated: Secondary | ICD-10-CM

## 2012-06-05 DIAGNOSIS — J189 Pneumonia, unspecified organism: Secondary | ICD-10-CM

## 2012-06-05 DIAGNOSIS — E785 Hyperlipidemia, unspecified: Secondary | ICD-10-CM

## 2012-06-05 DIAGNOSIS — I82402 Acute embolism and thrombosis of unspecified deep veins of left lower extremity: Secondary | ICD-10-CM

## 2012-06-05 LAB — HEPATIC FUNCTION PANEL
ALT: 21 U/L (ref 0–35)
AST: 31 U/L (ref 0–37)
Albumin: 4.4 g/dL (ref 3.5–5.2)
Alkaline Phosphatase: 41 U/L (ref 39–117)
Bilirubin, Direct: 0.1 mg/dL (ref 0.0–0.3)
Indirect Bilirubin: 0.5 mg/dL (ref 0.0–0.9)
Total Bilirubin: 0.6 mg/dL (ref 0.3–1.2)
Total Protein: 6.9 g/dL (ref 6.0–8.3)

## 2012-06-05 LAB — CBC WITH DIFFERENTIAL/PLATELET
Basophils Absolute: 0.1 10*3/uL (ref 0.0–0.1)
Basophils Relative: 2 % — ABNORMAL HIGH (ref 0–1)
Eosinophils Absolute: 0.6 10*3/uL (ref 0.0–0.7)
Eosinophils Relative: 11 % — ABNORMAL HIGH (ref 0–5)
HCT: 37.1 % (ref 36.0–46.0)
Hemoglobin: 12.4 g/dL (ref 12.0–15.0)
Lymphocytes Relative: 33 % (ref 12–46)
Lymphs Abs: 1.9 10*3/uL (ref 0.7–4.0)
MCH: 32.5 pg (ref 26.0–34.0)
MCHC: 33.4 g/dL (ref 30.0–36.0)
MCV: 97.4 fL (ref 78.0–100.0)
Monocytes Absolute: 0.7 10*3/uL (ref 0.1–1.0)
Monocytes Relative: 12 % (ref 3–12)
Neutro Abs: 2.5 10*3/uL (ref 1.7–7.7)
Neutrophils Relative %: 42 % — ABNORMAL LOW (ref 43–77)
Platelets: 311 10*3/uL (ref 150–400)
RBC: 3.81 MIL/uL — ABNORMAL LOW (ref 3.87–5.11)
RDW: 13.8 % (ref 11.5–15.5)
WBC: 5.8 10*3/uL (ref 4.0–10.5)

## 2012-06-05 LAB — LIPID PANEL
Cholesterol: 221 mg/dL — ABNORMAL HIGH (ref 0–200)
HDL: 79 mg/dL (ref >39)
LDL Cholesterol: 115 mg/dL — ABNORMAL HIGH (ref 0–99)
Total CHOL/HDL Ratio: 2.8 Ratio
Triglycerides: 134 mg/dL (ref <150)
VLDL: 27 mg/dL (ref 0–40)

## 2012-06-05 LAB — BASIC METABOLIC PANEL
BUN: 19 mg/dL (ref 6–23)
CO2: 27 mEq/L (ref 19–32)
Calcium: 10.6 mg/dL — ABNORMAL HIGH (ref 8.4–10.5)
Chloride: 105 mEq/L (ref 96–112)
Creat: 0.98 mg/dL (ref 0.50–1.10)
Glucose, Bld: 82 mg/dL (ref 70–99)
Potassium: 5.4 mEq/L — ABNORMAL HIGH (ref 3.5–5.3)
Sodium: 141 mEq/L (ref 135–145)

## 2012-06-05 LAB — TSH: TSH: 3.741 u[IU]/mL (ref 0.350–4.500)

## 2012-06-05 NOTE — Telephone Encounter (Signed)
Pt given results of CT Chest.

## 2012-06-09 ENCOUNTER — Other Ambulatory Visit: Payer: Self-pay | Admitting: Family Medicine

## 2012-06-09 ENCOUNTER — Ambulatory Visit: Payer: Medicare Other

## 2012-06-09 ENCOUNTER — Encounter: Payer: Self-pay | Admitting: Family Medicine

## 2012-06-10 ENCOUNTER — Encounter: Payer: Self-pay | Admitting: *Deleted

## 2012-06-10 ENCOUNTER — Telehealth: Payer: Self-pay | Admitting: *Deleted

## 2012-06-10 ENCOUNTER — Ambulatory Visit: Payer: Self-pay | Admitting: Family Medicine

## 2012-06-10 ENCOUNTER — Ambulatory Visit (INDEPENDENT_AMBULATORY_CARE_PROVIDER_SITE_OTHER): Payer: Medicare Other

## 2012-06-10 ENCOUNTER — Ambulatory Visit
Admission: RE | Admit: 2012-06-10 | Discharge: 2012-06-10 | Disposition: A | Discharge status: Home / Self Care | Payer: Medicare Other | Source: Ambulatory Visit | Attending: Family Medicine | Admitting: Family Medicine

## 2012-06-10 ENCOUNTER — Ambulatory Visit (INDEPENDENT_AMBULATORY_CARE_PROVIDER_SITE_OTHER): Payer: Medicare Other | Admitting: *Deleted

## 2012-06-10 VITALS — BP 102/68 | HR 62 | Temp 98.8°F | Resp 18 | Ht 61.25 in | Wt 108.0 lb

## 2012-06-10 DIAGNOSIS — J45909 Unspecified asthma, uncomplicated: Secondary | ICD-10-CM

## 2012-06-10 DIAGNOSIS — M25571 Pain in right ankle and joints of right foot: Secondary | ICD-10-CM

## 2012-06-10 DIAGNOSIS — M25579 Pain in unspecified ankle and joints of unspecified foot: Secondary | ICD-10-CM

## 2012-06-10 DIAGNOSIS — M1120 Other chondrocalcinosis, unspecified site: Secondary | ICD-10-CM

## 2012-06-10 DIAGNOSIS — M1189 Other specified crystal arthropathies, multiple sites: Secondary | ICD-10-CM

## 2012-06-10 DIAGNOSIS — E0590 Thyrotoxicosis, unspecified without thyrotoxic crisis or storm: Secondary | ICD-10-CM

## 2012-06-10 DIAGNOSIS — E785 Hyperlipidemia, unspecified: Secondary | ICD-10-CM

## 2012-06-10 MED ORDER — COLCHICINE 0.6 MG PO TABS: 0.6000 mg | ORAL_TABLET | Freq: Every day | ORAL | Status: DC

## 2012-06-10 MED ORDER — PSEUDOEPHEDRINE-GUAIFENESIN ER 60-600 MG PO TB12: 1.0000 | ORAL_TABLET | Freq: Two times a day (BID) | ORAL | Status: DC

## 2012-06-10 NOTE — Telephone Encounter (Signed)
Pt faxed letter requesting [place] to write letter for her to purchase a HVAC system and a home generator. Per letter, she would like this mailed to her home address on file Per PW: this is ok.   Letter has been completed and placed in PW's look at at [facility] office for signature. Will inform pt once completed.

## 2012-06-10 NOTE — Progress Notes (Signed)
Subjective:    Patient ID: Ebony Scott, female    DOB: [birth year], 62 y.o.   MRN: [id number]  HPI  Patient continues to complain of pain in her right ankle. She was hospitalized for DVT in her left leg. During that hospital admission she developed severe pain in her knee as well as her ankle. Orthopedics consult. They performed an arthrocentesis which revealed pseudogout. She's continued to have pain in that ankle a daily basis and presents. She reports sharp stinging pain in the ankle with prolonged standing and walking. She also complains of like burning paresthesias in the foot.  She is here today for followup of her hyperlipidemia. We have held Crestor now for several months. She recently had a fasting lipid panel which revealed an elevated total cholesterol, but an HDL of 79 and LDL of 115.  Chest has hypothyroidism. She takes PTU twice a day to control that. We recently obtained a TSH which was in therapeutic limits. She denies any palpitations, heat intolerance, fever, or weight loss. Past Medical History  Diagnosis Date  . Chronic neck pain   . Allergic rhinitis   . Moderate persistent asthma     -FeV1 72% 2011, -IgE 102 2011, CT sinus Neg 2011  . GERD (gastroesophageal reflux disease)   . Hypothyroidism   . Osteoporosis     on reclast yearly  . Hiatal hernia   . Diverticulosis   . Hemorrhoids   . Asthma   . IBS (irritable bowel syndrome)   . Complication of anesthesia     "had hard time waking up from it several times" (02/20/2012)  . Hypercholesteremia   . Pneumonia 04/2011; ~ 11/2011    "double; single" (02/20/2012)  . Exertional dyspnea   . Graves disease   . Headache     "related to allergies; more at different times during the year" (02/20/2012)  . Arthritis     "mostly the hands" (02/20/2012)  . Fibromyalgia 11/2011  . Chronic lower back pain   . Depression     "some; don't take anything for it" (02/20/2012)  . Breast cancer 1998    in remission  . Anxiety    Current  Outpatient Prescriptions on File Prior to Visit  Medication Sig Dispense Refill  . acetaminophen (TYLENOL ARTHRITIS PAIN) 650 MG CR tablet Take 1,300 mg by mouth 2 (two) times daily.        . Alpha-D-Galactosidase (BEANO) TABS Take 1 tablet by mouth daily.       . Alpha-Lipoic Acid 600 MG CAPS Take 600 mg by mouth daily. Once a day      . Alum & Mag Hydroxide-Simeth (MAGIC MOUTHWASH) SOLN Take 5-10 mLs by mouth 3 (three) times daily as needed. For irritation      . Azelastine HCl 0.15 % SOLN Place 1 spray into the nose daily.      . Calcium Citrate-Vitamin D (CITRACAL MAXIMUM) 315-250 MG-UNIT TABS Take 1 tablet by mouth 2 (two) times daily.       . Cholecalciferol (VITAMIN D) 2000 UNITS CAPS Take 0.5 capsules by mouth at bedtime. 1/2 at bedtime      . EPINEPHrine (EPIPEN JR) 0.15 MG/0.3ML injection Inject 0.3 mLs (0.15 mg total) into the muscle as needed. For reaction  1 each  4  . gabapentin (NEURONTIN) 600 MG tablet Take 600 mg by mouth daily. Once a day      . guaiFENesin (MUCINEX) 600 MG 12 hr tablet Take 600 mg by mouth 2 (two)  times daily.      Marland Kitchen lactase (LACTAID) 3000 UNITS tablet Take 3 tablets by mouth as needed. 3-4 tabs with dairy liquids and solids      . levocetirizine (XYZAL) 5 MG tablet Take 5 mg by mouth daily.      Marland Kitchen lidocaine (LIDODERM) 5 % Place 1 patch onto the skin daily as needed. Remove & Discard patch within 12 hours or as directed by MD      . Melatonin 3 MG CAPS Take 9 mg by mouth at bedtime. 3 tabs at bedtime      . mometasone (NASONEX) 50 MCG/ACT nasal spray HOLD for one week then resume  17 g    . mometasone-formoterol (DULERA) 100-5 MCG/ACT AERO Inhale 1 puff into the lungs 2 (two) times daily.       . montelukast (SINGULAIR) 10 MG tablet Take 10 mg by mouth Daily.       . naproxen sodium (ANAPROX) 220 MG tablet Take 220 mg by mouth at bedtime as needed.      . neomycin-bacitracin-polymyxin (NEOSPORIN) ointment Apply 1 application topically every 12 (twelve) hours.  Use on/in nose as directed as needed      . nystatin-triamcinolone (MYCOLOG II) cream Apply 1 application topically 2 (two) times daily as needed. For irritation      . omalizumab (XOLAIR) 150 MG injection Inject 150 mg into the skin every 28 (twenty-eight) days. 150 SQ every 4 weeks      . Pancrelipase, Lip-Prot-Amyl, (CREON) 24000 UNITS CPEP Take by mouth. 1 with each meal       . Polyethyl Glycol-Propyl Glycol (SYSTANE ULTRA) 0.4-0.3 % SOLN Apply 1 drop to eye 2 (two) times daily as needed. 1-2 drops each eye every morning and at bedtime      . Prenatal Vit-Sel-Fe Fum-FA (VINATE M) 27-1 MG TABS Take 1 tablet by mouth daily.      . Probiotic Product (ALIGN) 4 MG CAPS Take 1 tablet by mouth daily. Once a day      . PROCTOZONE-HC 2.5 % rectal cream Apply as needed as directed      . propylthiouracil (PTU) 50 MG tablet TAKE ONE TABLET BY MOUTH TWICE DAILY  60 tablet  5  . Rivaroxaban (XARELTO) 20 MG TABS Take 1 tablet (20 mg total) by mouth daily with supper.  30 tablet  0  . Simethicone (GAS-X EXTRA STRENGTH) 125 MG CAPS Take 2 each by mouth daily as needed. For gas relief      . Sodium Chloride-Sodium Bicarb (CLASSIC NETI POT SINUS WASH NA) Place into the nose.      . [DISCONTINUED] methimazole (TAPAZOLE) 5 MG tablet Take 5 mg by mouth 3 (three) times daily.         No current facility-administered medications on file prior to visit.   scoliosis Allergies  Allergen Reactions  . Dust Mite Extract Shortness Of Breath and Other (See Comments)    "sneezing" (02/20/2012)  . Molds & Smuts Shortness Of Breath  . Morphine Sulfate Itching  . Other Shortness Of Breath and Other (See Comments)    Grass and weeds "sneezing; filled sinuses" (02/20/2012)  . Penicillins Rash and Other (See Comments)    "welts" (02/20/2012)  . Reclast (Zoledronic Acid)     Put in hospital  . Rofecoxib Swelling    Celebrex REACTION: feet swelling  . Shrimp Flavor Anaphylaxis    ALL SHELLFISH  . Tetracycline Hcl Nausea  And Vomiting  . Dilaudid (Hydromorphone Hcl) Itching  .  Hydrocodone-Acetaminophen Nausea And Vomiting  . Levofloxacin Other (See Comments)    REACTION: GI upset  . Oxycodone Hcl Nausea And Vomiting  . Paroxetine Nausea And Vomiting    Paxil   . Soy Allergy Other (See Comments)    Limited soy d/t "ESTROGEN POSITIVE" cancer  . Tree Extract Other (See Comments)    "tested and told I was allergic to it; never experienced a reaction to it" (02/20/2012)     Review of Systems Review of systems is otherwise negative.    Objective:   Physical Exam  Constitutional: She appears well-developed and well-nourished.  Cardiovascular: Normal rate, regular rhythm and normal heart sounds.  Exam reveals no gallop and no friction rub.   No murmur heard. Pulmonary/Chest: Effort normal and breath sounds normal. No respiratory distress. She has no wheezes. She has no rales.  Abdominal: Soft. Bowel sounds are normal. She exhibits no distension and no mass. There is no tenderness. There is no rebound and no guarding.  Musculoskeletal:       Right ankle: She exhibits normal range of motion, no swelling and no ecchymosis. Tenderness. AITFL tenderness found. No lateral malleolus, no medial malleolus, no posterior TFL, no head of 5th metatarsal and no proximal fibula tenderness found. Achilles tendon normal.          Assessment & Plan:  1. Pain in joint, ankle and foot, right I suspect pseudogout. We'll obtain x-rays to rule out skeletal pathology. If x-rays are negative, would consider starting daily colchicine 0.6 mg to suppress pseudogout - DG Ankle Complete Right; Future  2. Pseudogout involving multiple joints See plan dictated above.  3. HLD (hyperlipidemia) Fasting lipid panel shows excellent control. I see no indication to continue statin therapy. We will recheck lipid panel in 6 months. The present time continue low-fat diet with daily aerobic exercise  4. Hyperthyroidism Currently well  controlled with propylthiouracil.  Continue the present dose and recheck a TSH in 6 months.

## 2012-06-10 NOTE — Addendum Note (Signed)
Addended by: Lynnea Ferrier T on: 06/10/2012 01:14 PM   Modules accepted: Orders

## 2012-06-11 MED ORDER — OMALIZUMAB 150 MG ~~LOC~~ SOLR
150.0000 mg | Freq: Once | SUBCUTANEOUS | Status: AC
  Administered 2012-06-11: 150 mg via SUBCUTANEOUS

## 2012-06-11 NOTE — Telephone Encounter (Signed)
No changes offered at this time

## 2012-06-11 NOTE — Telephone Encounter (Signed)
Called, spoke with pt.  Informed her of below per Dr. Delford Field. She verbalized understanding of this and voiced no further questions or concerns at this time.

## 2012-06-11 NOTE — Telephone Encounter (Signed)
Letter signed by PW and placed in mail to pt's home address.  She is aware.  Pt states she was advised there is more inflammation on the last CT done a few weeks ago.  States she is having noticing more SOB when trying to do activities around her house like vacuuming, her chest feels heavier, and is coughing more in the afternoons and evenings.  Reports cough has only been prod about 2-3 times with a small amount of yellow mucus.  I offered OV .  Pt states she is unable to come in now and would like to know if PW thinks there is anything she can do to help with this.  Dr. Delford Field, pls advise.  Thank you.

## 2012-06-16 ENCOUNTER — Encounter: Payer: Self-pay | Admitting: Family Medicine

## 2012-06-28 IMAGING — CT CT ANGIO CHEST
2 of 6 series · 18 of 36 positions shown · IV contrast ([ID] [id number])
Comparison: CT chest 11/24/2008 and 10/21/2007.

CLINICAL DATA: Shortness of breath.  Remote history of breast
cancer.

CT ANGIOGRAPHY CHEST WITH CONTRAST
TECHNIQUE: Multidetector CT imaging of the chest was performed
using the standard protocol during bolus administration of
intravenous contrast.  Multiplanar CT image reconstructions
including MIPs were obtained to evaluate the vascular anatomy.
Contrast:  100 ml Pmnipaque-VGG.

[Series 6: pe thin 1.25 · axial · 0.57mm/px · z∈[-300,-56]mm · 17 of 221 slices shown]
[im 13/221  lung]
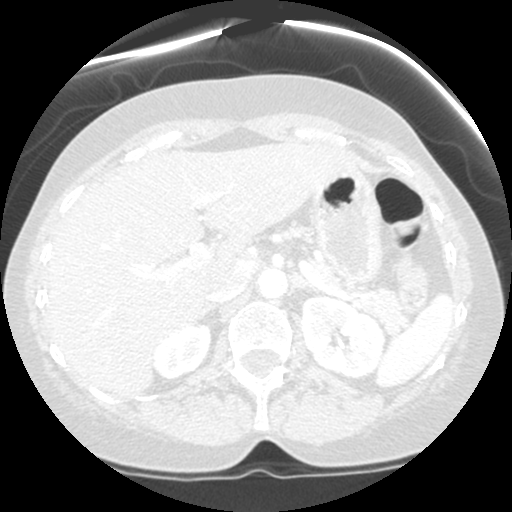
[im 25/221  mediastinal]
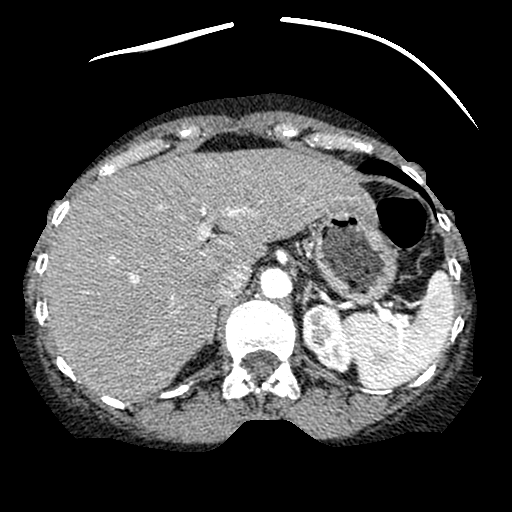
[im 37/221  lung]
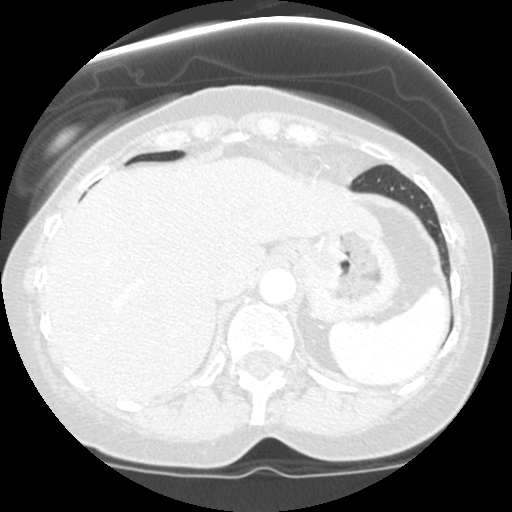
[im 49/221  mediastinal]
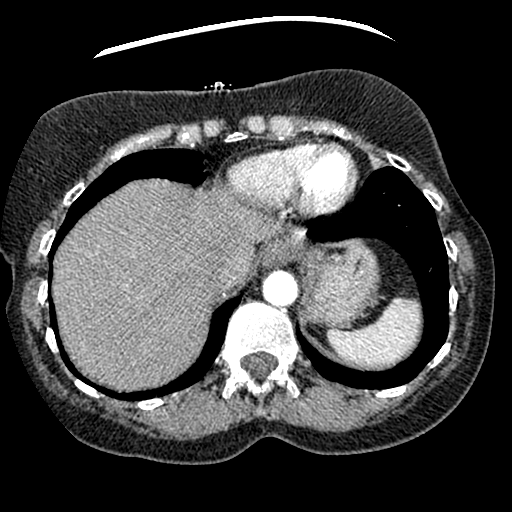
[im 62/221  lung]
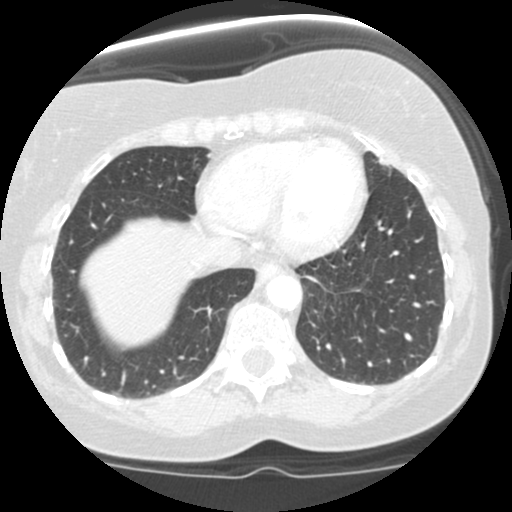
[im 74/221  mediastinal]
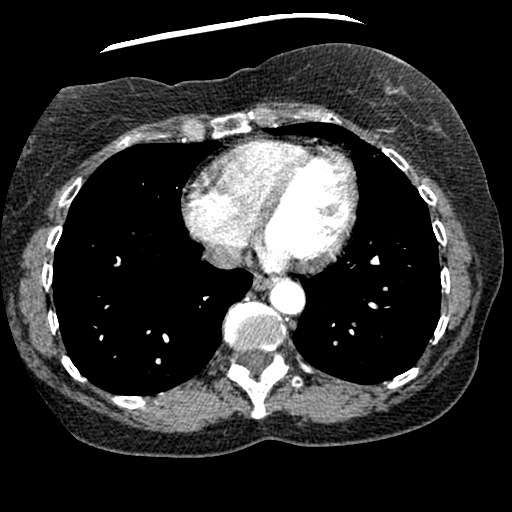
[im 86/221  lung]
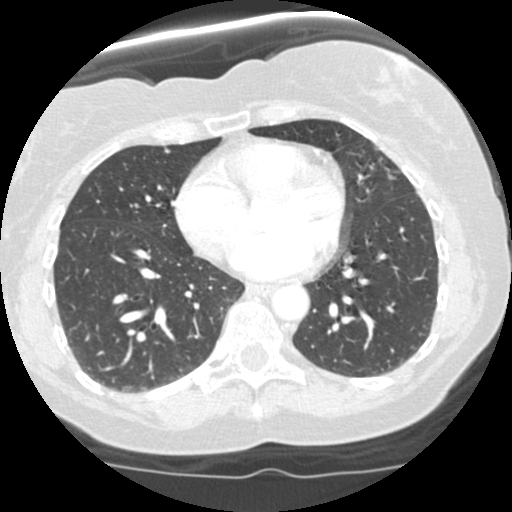
[im 98/221  mediastinal]
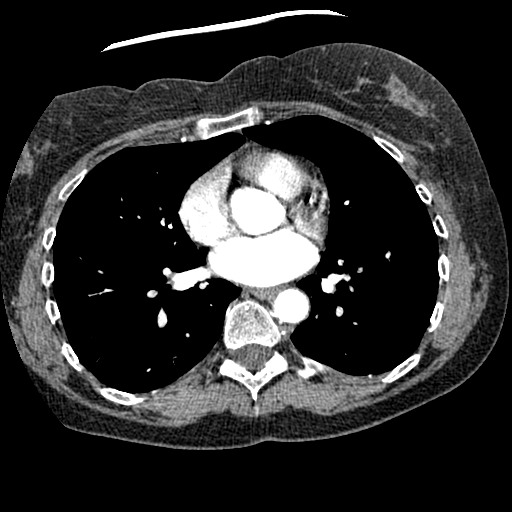
[im 111/221  lung]
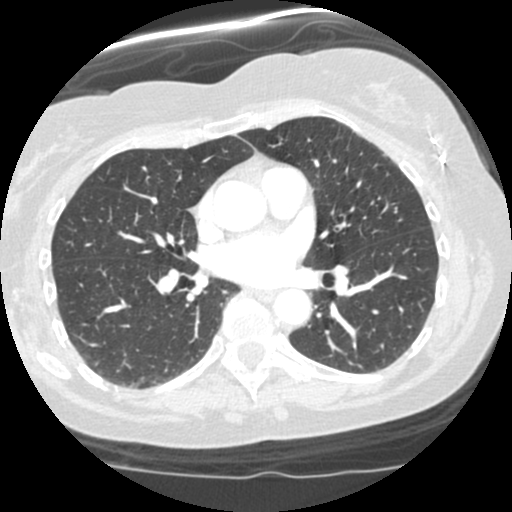
[im 123/221  mediastinal]
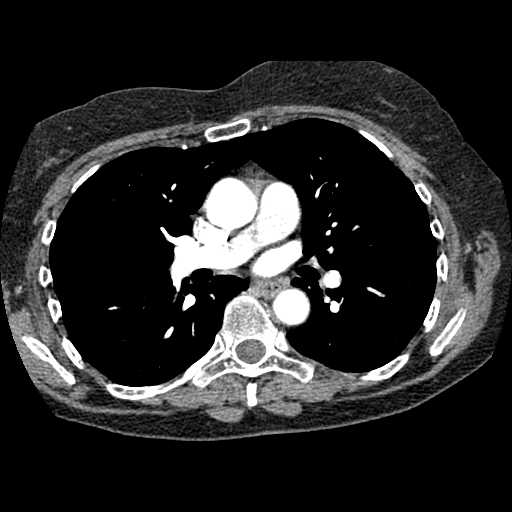
[im 135/221  lung]
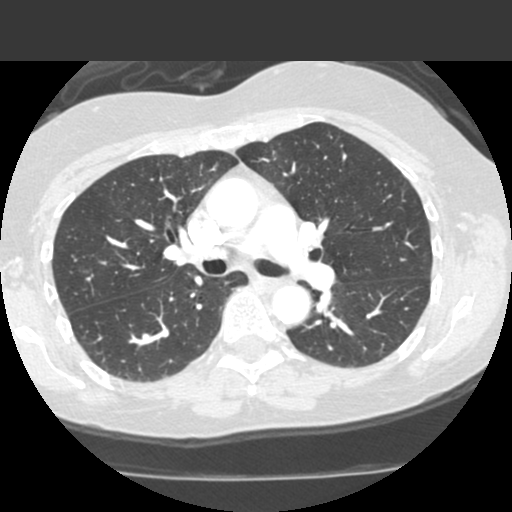
[im 147/221  mediastinal]
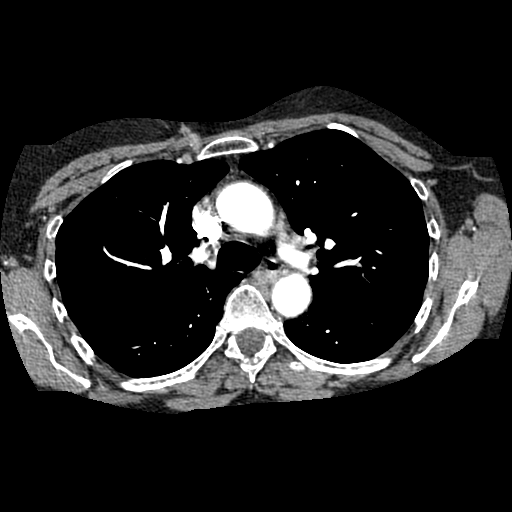
[im 159/221  lung]
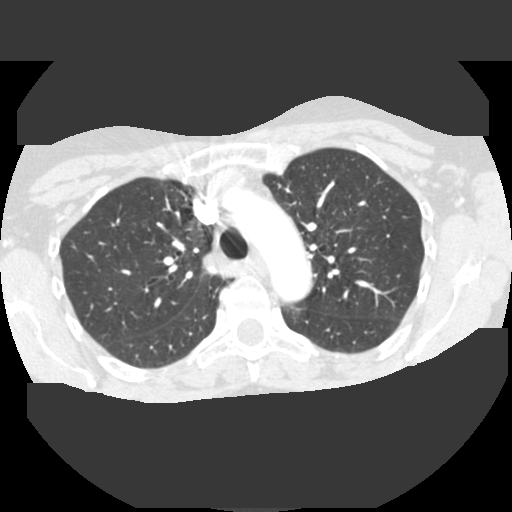
[im 172/221  mediastinal]
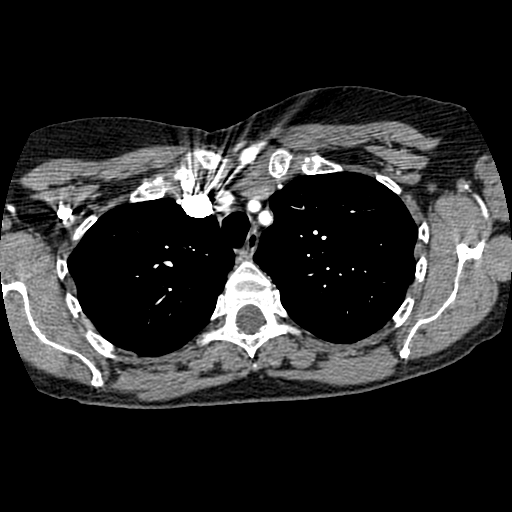
[im 184/221  lung]
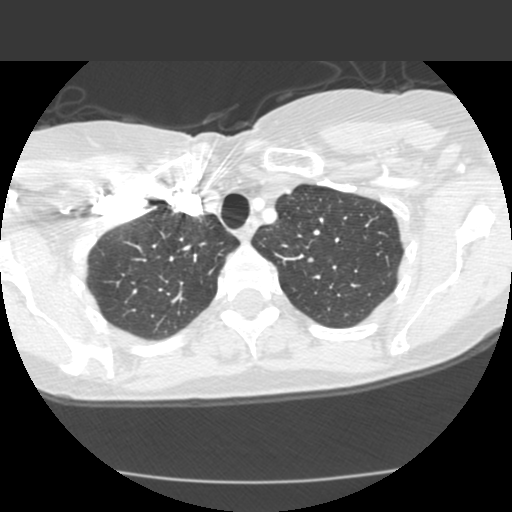
[im 196/221  mediastinal]
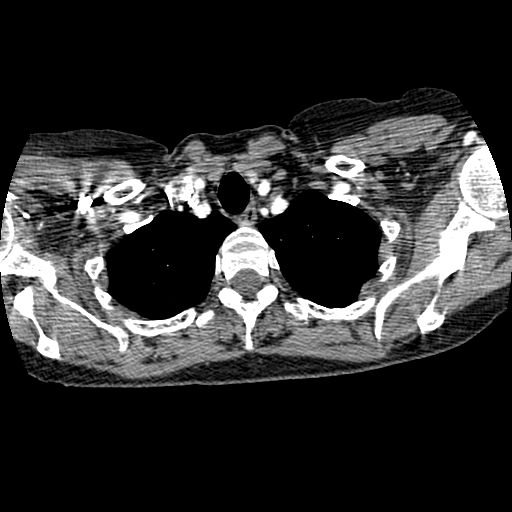
[im 208/221  lung]
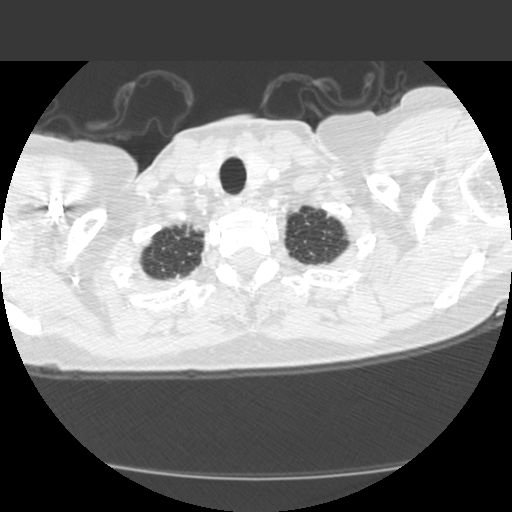

[Series 105: cor · coronal · 0.57mm/px · 1 of 96 slices shown]
[im 48/96  mediastinal]
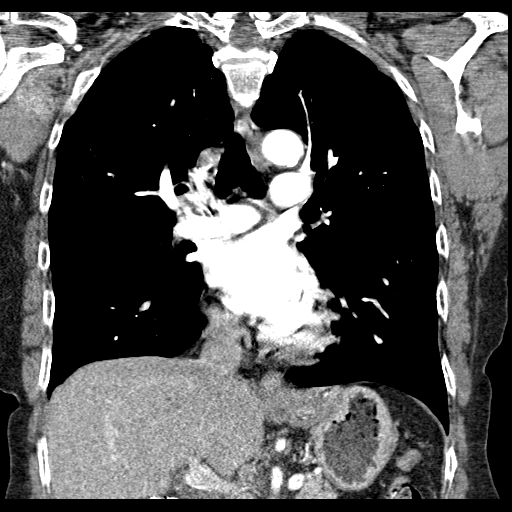

[18 of 36 positions shown; findings below may reference images not displayed]

FINDINGS: No pulmonary embolus is identified.  There is no
axillary, hilar or mediastinal lymphadenopathy.  No pleural or
pericardial effusion.

Lungs demonstrate an unchanged 0.6 cm left lower lobe nodule on
image 83 and and unchanged 0.5 cm right lower lobe nodule on image
72.  No new or enlarging pulmonary nodules are identified.  The
lungs are otherwise unremarkable.

Incidentally imaged upper abdomen shows postoperative change of
cholecystectomy.  The liver is low attenuating compatible with
fatty change.  Abdominal contents are otherwise unremarkable.  No
focal bony abnormality.

Review of the MIP images confirms the above findings.
IMPRESSION: 1.  Negative for pulmonary embolus or acute cardiopulmonary
disease.
2.  Unchanged small bilateral pulmonary nodules.  Given stability
for over 2 years, no follow-up is indicated.
3.  Mild appearing fatty infiltration of the liver.

## 2012-07-02 ENCOUNTER — Other Ambulatory Visit: Payer: Self-pay | Admitting: Family Medicine

## 2012-07-02 MED ORDER — PANCRELIPASE (LIP-PROT-AMYL) 24000-76000 UNITS PO CPEP: 24000.0000 [IU] | ORAL_CAPSULE | Freq: Every day | ORAL | Status: DC

## 2012-07-02 NOTE — Telephone Encounter (Signed)
Script refilled

## 2012-07-08 ENCOUNTER — Ambulatory Visit (INDEPENDENT_AMBULATORY_CARE_PROVIDER_SITE_OTHER): Payer: Medicare Other

## 2012-07-08 DIAGNOSIS — J45909 Unspecified asthma, uncomplicated: Secondary | ICD-10-CM

## 2012-07-09 MED ORDER — OMALIZUMAB 150 MG ~~LOC~~ SOLR
300.0000 mg | Freq: Once | SUBCUTANEOUS | Status: AC
  Administered 2012-07-09: 300 mg via SUBCUTANEOUS

## 2012-07-09 MED ORDER — OMALIZUMAB 150 MG ~~LOC~~ SOLR
150.0000 mg | Freq: Once | SUBCUTANEOUS | Status: AC
  Administered 2012-07-09: 150 mg via SUBCUTANEOUS

## 2012-07-13 IMAGING — CT CT PARANASAL SINUSES LIMITED
1 series · 16 of 24 positions shown, 20 images · non-contrast
Comparison: None.

CLINICAL DATA: Maxillary and frontal pain.  History of nasal septum
repair and sinus surgery.

CT PARANASAL SINUS LIMITED WITHOUT CONTRAST
TECHNIQUE: Multidetector CT images of the paranasal sinuses were
obtained in a single plane without contrast.

[Series 3: ltd sinus 3.0 h30s · axial · 0.20mm/px · z∈[+1112,+1210]mm · 16 of 24 slices shown, 20 images]
[im 2/24  brain]
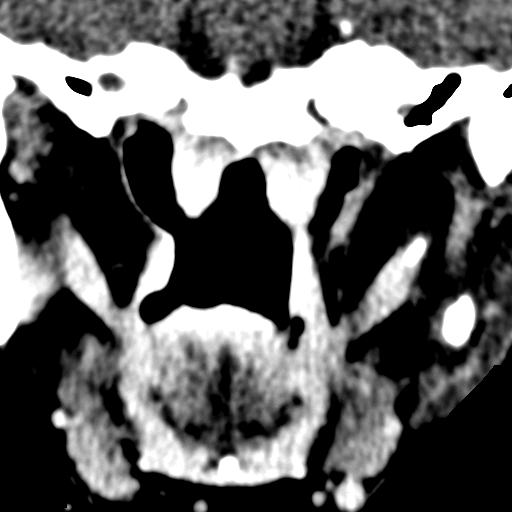
[im 2/24  bone]
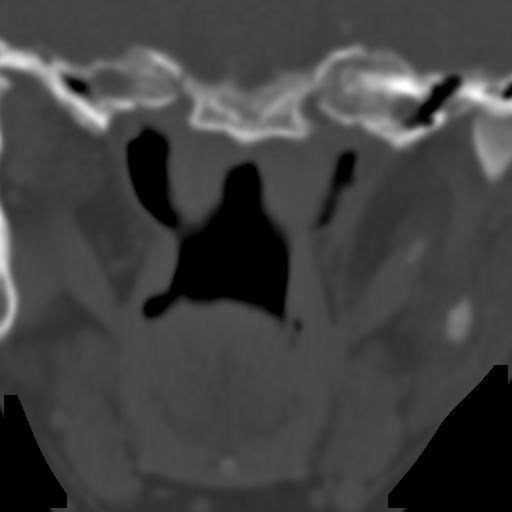
[im 4/24  bone]
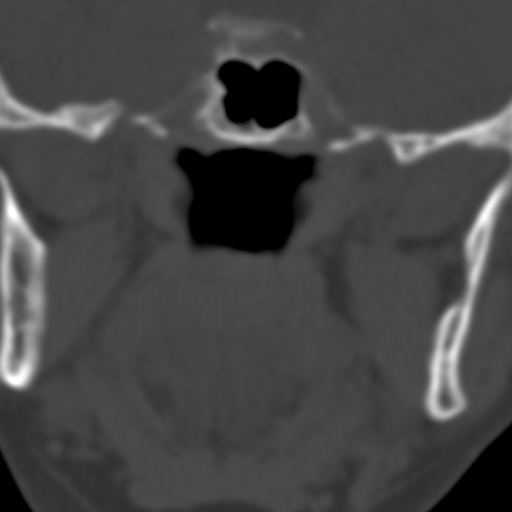
[im 5/24  bone]
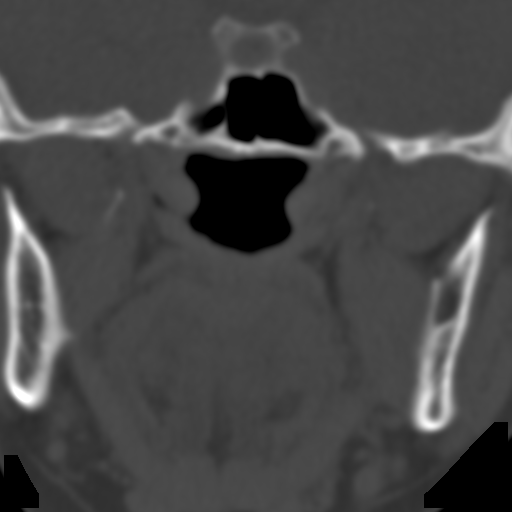
[im 6/24  bone]
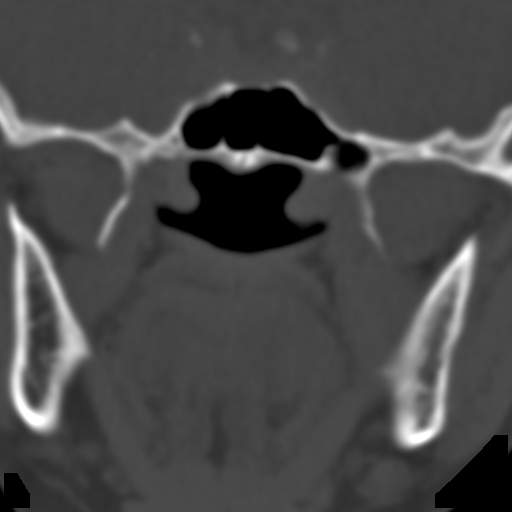
[im 8/24  brain]
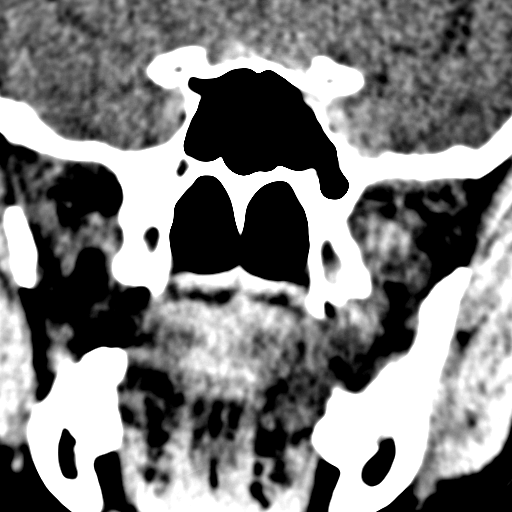
[im 8/24  bone]
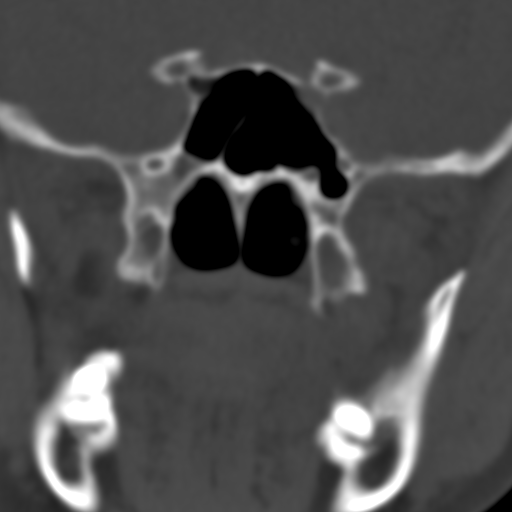
[im 9/24  bone]
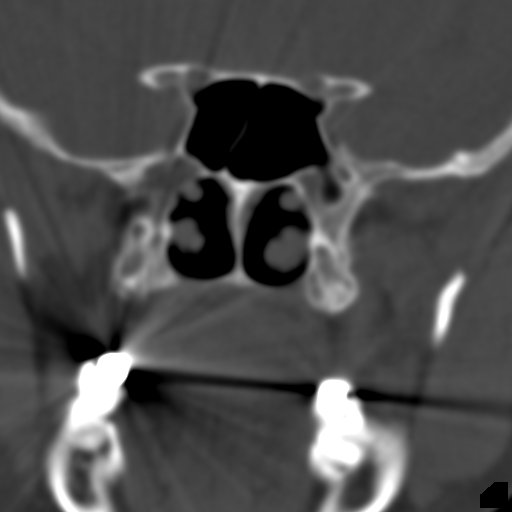
[im 10/24  bone]
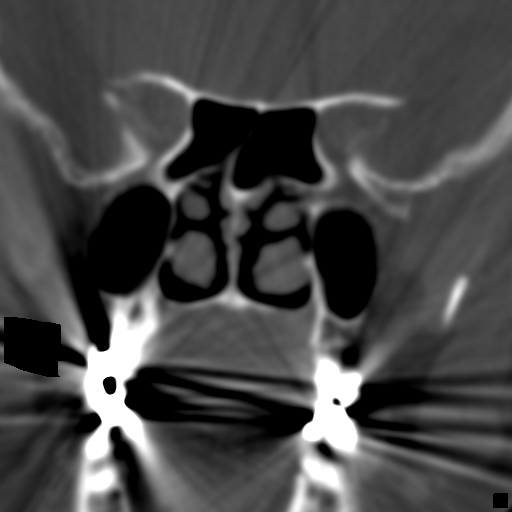
[im 12/24  bone]
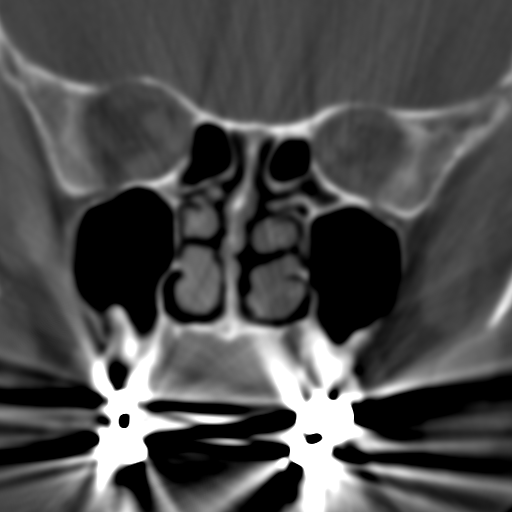
[im 13/24  brain]
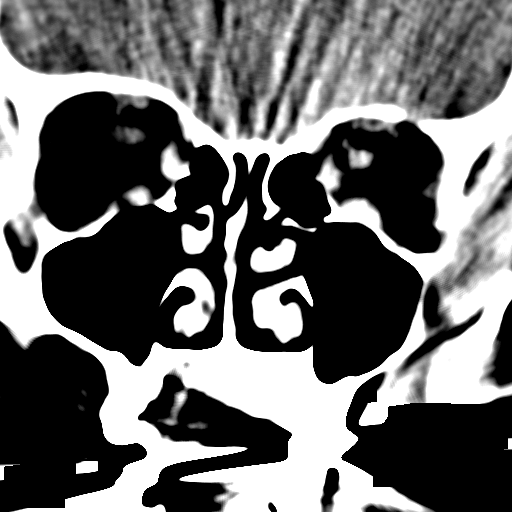
[im 13/24  bone]
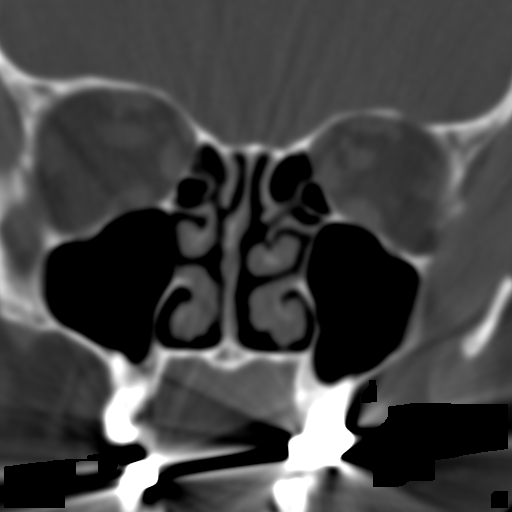
[im 15/24  bone]
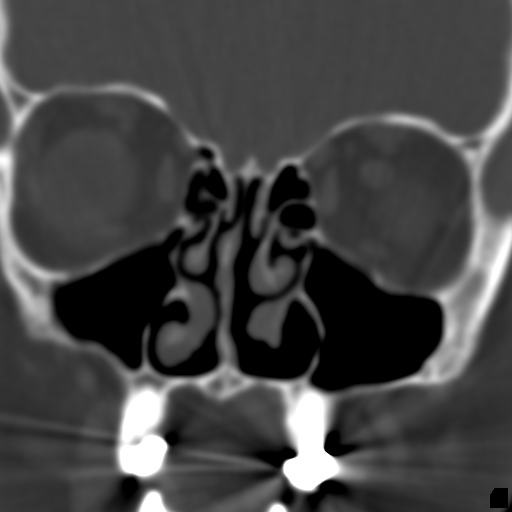
[im 16/24  bone]
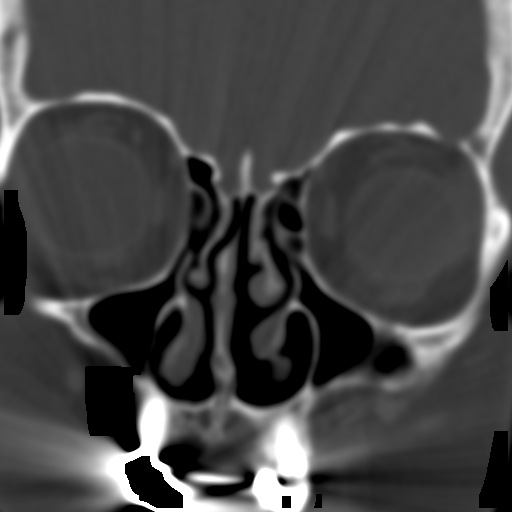
[im 17/24  bone]
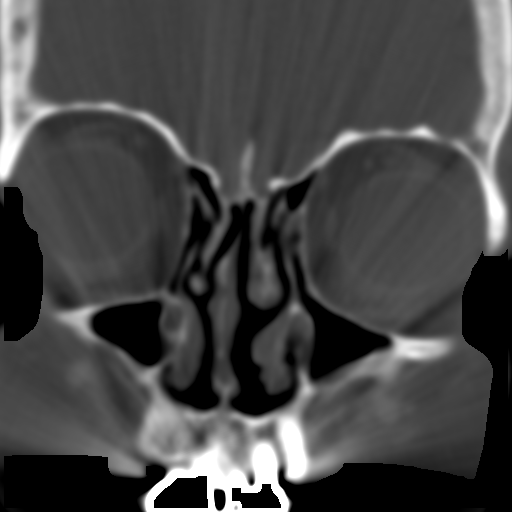
[im 19/24  brain]
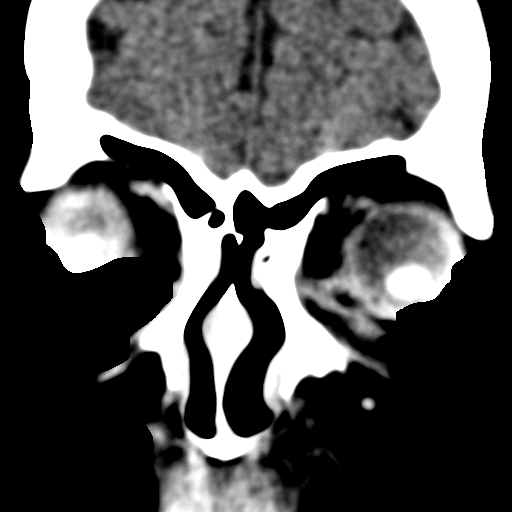
[im 19/24  bone]
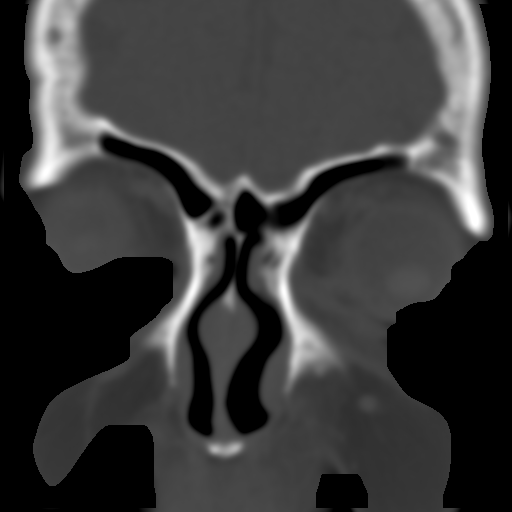
[im 20/24  bone]
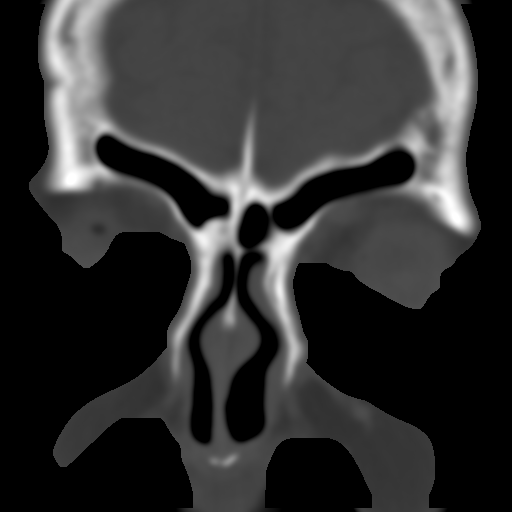
[im 21/24  bone]
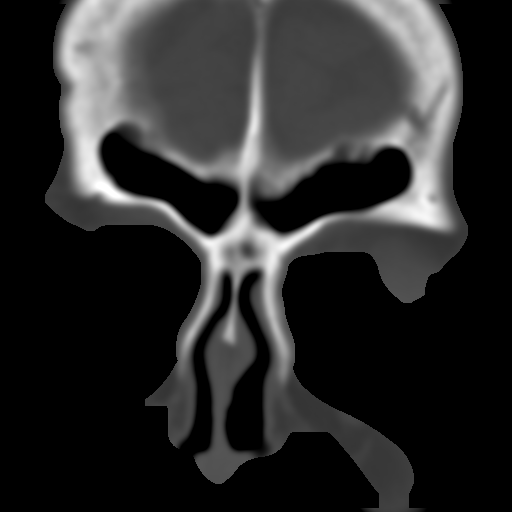
[im 23/24  bone]
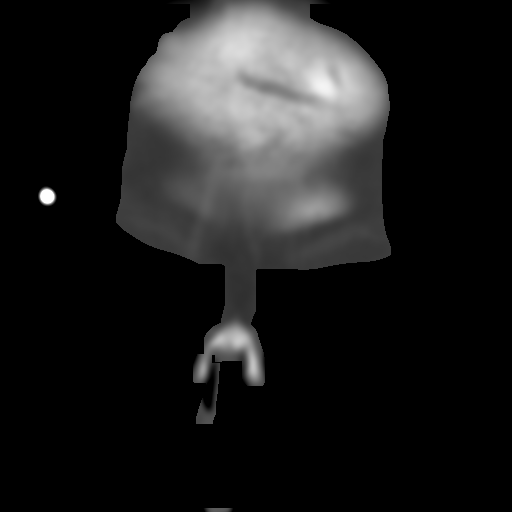

[16 of 24 positions shown; findings below may reference images not displayed]

FINDINGS: Dental amalgam obscures detail.  The paranasal sinuses
are clear.  Ostiomeatal units are patent bilaterally.  No
visualized fracture line.  Orbits are unremarkable.
IMPRESSION: Normal exam.

## 2012-07-30 ENCOUNTER — Other Ambulatory Visit: Payer: Self-pay | Admitting: Critical Care Medicine

## 2012-08-05 ENCOUNTER — Encounter: Payer: Self-pay | Admitting: Critical Care Medicine

## 2012-08-05 ENCOUNTER — Ambulatory Visit (INDEPENDENT_AMBULATORY_CARE_PROVIDER_SITE_OTHER): Payer: Medicare Other

## 2012-08-05 ENCOUNTER — Ambulatory Visit (INDEPENDENT_AMBULATORY_CARE_PROVIDER_SITE_OTHER): Payer: Medicare Other | Admitting: Critical Care Medicine

## 2012-08-05 VITALS — BP 102/68 | HR 89 | Temp 98.2°F | Ht 61.0 in | Wt 109.0 lb

## 2012-08-05 DIAGNOSIS — J4550 Severe persistent asthma, uncomplicated: Secondary | ICD-10-CM

## 2012-08-05 DIAGNOSIS — J45909 Unspecified asthma, uncomplicated: Secondary | ICD-10-CM

## 2012-08-05 NOTE — Patient Instructions (Addendum)
Trial Dymista one puff twice  Daily in place of flonase/nasonex and astepro No other medication changes Return 3 months

## 2012-08-05 NOTE — Progress Notes (Signed)
Subjective:    Patient ID: Ebony Scott, female    DOB: 08-Sep-1950, 62 y.o.   MRN: [id number]  HPI 62 y.o.. WF atopic moderate persistent asthma, elevated IgE. Started Xolair 05/03/10  Initial Pulmonary Consultation 12/23/09  08/05/2012 Chief Complaint  Patient presents with  . Follow-up    Pt c/o productive cough with yellow phlegm in mornings.    CT 06/05/12: Resolved R apical GG infiltrate but new area in base of RUL  Working on the house except steps and flooring.  Porch leaking>>repaired. Wall out of bathroom full of mold. Near bedroom (not the pts)  Under neath dining room All repaired now.    Past Medical History  Diagnosis Date  . Chronic neck pain   . Allergic rhinitis   . Moderate persistent asthma     -FeV1 72% 2011, -IgE 102 2011, CT sinus Neg 2011  . GERD (gastroesophageal reflux disease)   . Hypothyroidism   . Osteoporosis     on reclast yearly  . Hiatal hernia   . Diverticulosis   . Hemorrhoids   . Asthma   . IBS (irritable bowel syndrome)   . Complication of anesthesia     "had hard time waking up from it several times" (02/20/2012)  . Hypercholesteremia   . Pneumonia 04/2011; ~ 11/2011    "double; single" (02/20/2012)  . Exertional dyspnea   . Graves disease   . Headache(784.0)     "related to allergies; more at different times during the year" (02/20/2012)  . Arthritis     "mostly the hands" (02/20/2012)  . Fibromyalgia 11/2011  . Chronic lower back pain   . Depression     "some; don't take anything for it" (02/20/2012)  . Breast cancer 1998    in remission  . Anxiety      Family History  Problem Relation Age of Onset  . Allergies Mother   . Allergies Father   . Heart disease Mother   . Heart disease Father   . Arthritis Mother   . Arthritis Father   . Lung cancer Mother   . Colon cancer      Maternal half uncle  . Colon cancer      Maternal half aunt  . Colitis Daughter     Review of SystemsConstitutional:   No  weight loss, night sweats,   Fevers, chills,  +fatigue, or  lassitude.  HEENT:   No headaches,  Difficulty swallowing,  Tooth/dental problems, or  Sore throat,                No sneezing, itching, ear ache,  +nasal congestion, post nasal drip,   CV:  No chest pain,  Orthopnea, PND, swelling in lower extremities, anasarca, dizziness, palpitations, syncope.   GI  No heartburn, indigestion, abdominal pain, nausea, vomiting, diarrhea, change in bowel habits, loss of appetite, bloody stools.   Resp:  ,  No coughing up of blood.   No chest wall deformity  Skin: no rash or lesions.  GU: no dysuria, change in color of urine, no urgency or frequency.  No flank pain, no hematuria   MS:  No joint pain or swelling.  No decreased range of motion.  No back pain.  Psych:  No change in mood or affect. No depression or anxiety.  No memory loss.    '     Objective:   Physical Exam   BP 102/68  Pulse 89  Temp(Src) 98.2 F (36.8 C) (Oral)  Ht 5'  1" (1.549 m)  Wt 109 lb (49.442 kg)  BMI 20.61 kg/m2  SpO2 96%  GEN: A/Ox3; pleasant , NAD, well nourished   HEENT:  [place]/AT,  EACs-clear, TMs-wnl, NOSE-clear drainage , THROAT-clear, no lesions, no postnasal drip or exudate noted.   NECK:  Supple w/ fair ROM; no JVD; normal carotid impulses w/o bruits; no thyromegaly or nodules palpated; no lymphadenopathy.  RESP  Clear  P & A; w/o, wheezes/ rales/ or rhonchi.no accessory muscle use, no dullness to percussion  CARD:  RRR, no m/r/g  , no peripheral edema, pulses intact, no cyanosis or clubbing.  GI:   Soft & nt; nml bowel sounds; no organomegaly or masses detected.  Musco: Warm bil, no deformities or joint swelling noted.   Neuro: alert, no focal deficits noted.    Skin: Warm, no lesions or rashes   No results found.     Assessment & Plan:   Severe persistent asthma with significant atopic features Severe persistent asthma with significant atopic features and elevated IgE levels with specific antibodies toward  Aspergillus and associated right upper lobe tree in bud inflammatory pattern. No significant environmental exposure with Aspergillus in the home. Significant mold seen in the home with recent efforts to improve this situation The patient is somewhat better now back on Xolair Plan Switch Flonase and Astelin to Dymista a 1 inhalation twice daily No additional medication changes Continue Xolair    Updated Medication List Outpatient Encounter Prescriptions as of 08/05/2012  Medication Sig Dispense Refill  . acetaminophen (TYLENOL ARTHRITIS PAIN) 650 MG CR tablet Take 1,300 mg by mouth 2 (two) times daily.        . Alpha-D-Galactosidase (BEANO) TABS Take 1 tablet by mouth daily.       . Alpha-Lipoic Acid 600 MG CAPS Take 600 mg by mouth daily. Once a day      . Alum & Mag Hydroxide-Simeth (MAGIC MOUTHWASH) SOLN Take 5-10 mLs by mouth 3 (three) times daily as needed. For irritation      . Azelastine HCl 0.15 % SOLN Place 1 spray into the nose daily.      . Calcium Citrate-Vitamin D (CITRACAL MAXIMUM) 315-250 MG-UNIT TABS Take 1 tablet by mouth 2 (two) times daily.       . Cholecalciferol (VITAMIN D) 2000 UNITS CAPS Take 0.5 capsules by mouth at bedtime. 1/2 at bedtime      . EPINEPHrine (EPIPEN JR) 0.15 MG/0.3ML injection Inject 0.3 mLs (0.15 mg total) into the muscle as needed. For reaction  1 each  4  . gabapentin (NEURONTIN) 600 MG tablet Take 600 mg by mouth daily. Once a day      . guaiFENesin (MUCINEX) 600 MG 12 hr tablet Take 600 mg by mouth 2 (two) times daily. Mucinex D      . lactase (LACTAID) 3000 UNITS tablet Take 3 tablets by mouth as needed. 3-4 tabs with dairy liquids and solids      . levocetirizine (XYZAL) 5 MG tablet Take 5 mg by mouth daily.      Marland Kitchen lidocaine (LIDODERM) 5 % Place 1 patch onto the skin daily as needed. Remove & Discard patch within 12 hours or as directed by MD      . Melatonin 3 MG CAPS Take 9 mg by mouth at bedtime. 3 tabs at bedtime      . mometasone (NASONEX)  50 MCG/ACT nasal spray Place 2 sprays into the nose daily. HOLD for one week then resume      .  mometasone-formoterol (DULERA) 100-5 MCG/ACT AERO Inhale 1 puff into the lungs 2 (two) times daily.       . montelukast (SINGULAIR) 10 MG tablet Take 10 mg by mouth Daily.       Marland Kitchen neomycin-bacitracin-polymyxin (NEOSPORIN) ointment Apply 1 application topically every 12 (twelve) hours. Use on/in nose as directed as needed      . nystatin-triamcinolone (MYCOLOG II) cream Apply 1 application topically 2 (two) times daily as needed. For irritation      . omalizumab (XOLAIR) 150 MG injection Inject 150 mg into the skin every 28 (twenty-eight) days. 150 SQ every 4 weeks      . Pancrelipase, Lip-Prot-Amyl, (CREON) 24000 UNITS CPEP Take 1 capsule (24,000 Units total) by mouth daily. 1 with each meal  180 capsule  1  . Polyethyl Glycol-Propyl Glycol (SYSTANE ULTRA) 0.4-0.3 % SOLN Apply 1 drop to eye 2 (two) times daily as needed. 1-2 drops each eye every morning and at bedtime      . Prenatal Vit-Sel-Fe Fum-FA (VINATE M) 27-1 MG TABS Take 1 tablet by mouth daily.      . Probiotic Product (ALIGN) 4 MG CAPS Take 1 tablet by mouth daily. Once a day      . PROCTOZONE-HC 2.5 % rectal cream Apply as needed as directed      . propylthiouracil (PTU) 50 MG tablet TAKE ONE TABLET BY MOUTH TWICE DAILY  60 tablet  5  . pseudoephedrine-guaifenesin (MUCINEX D) 60-600 MG per tablet Take 1 tablet by mouth every 12 (twelve) hours.  30 tablet  3  . Simethicone (GAS-X EXTRA STRENGTH) 125 MG CAPS Take 2 each by mouth daily as needed. For gas relief      . Sodium Chloride-Sodium Bicarb (CLASSIC NETI POT SINUS WASH NA) Place into the nose. Daily After AM walks      . [DISCONTINUED] DULERA 100-5 MCG/ACT AERO INHALE 2 PUFFS BY MOUTH TWICE DAILY  13 g  5  . [DISCONTINUED] mometasone (NASONEX) 50 MCG/ACT nasal spray HOLD for one week then resume  17 g    . [DISCONTINUED] colchicine 0.6 MG tablet Take 1 tablet (0.6 mg total) by mouth daily.   30 tablet  1  . [DISCONTINUED] naproxen sodium (ANAPROX) 220 MG tablet Take 220 mg by mouth at bedtime as needed.      . [DISCONTINUED] Rivaroxaban (XARELTO) 20 MG TABS Take 1 tablet (20 mg total) by mouth daily with supper.  30 tablet  0   No facility-administered encounter medications on file as of 08/05/2012.

## 2012-08-05 NOTE — Assessment & Plan Note (Signed)
Severe persistent asthma with significant atopic features and elevated IgE levels with specific antibodies toward Aspergillus and associated right upper lobe tree in bud inflammatory pattern. No significant environmental exposure with Aspergillus in the home. Significant mold seen in the home with recent efforts to improve this situation The patient is somewhat better now back on Xolair Plan Switch Flonase and Astelin to Dymista a 1 inhalation twice daily No additional medication changes Continue Xolair

## 2012-08-06 MED ORDER — OMALIZUMAB 150 MG ~~LOC~~ SOLR
150.0000 mg | Freq: Once | SUBCUTANEOUS | Status: AC
  Administered 2012-08-06: 150 mg via SUBCUTANEOUS

## 2012-08-18 ENCOUNTER — Encounter: Payer: Self-pay | Admitting: Critical Care Medicine

## 2012-09-03 ENCOUNTER — Ambulatory Visit (INDEPENDENT_AMBULATORY_CARE_PROVIDER_SITE_OTHER): Payer: Medicare Other

## 2012-09-03 DIAGNOSIS — J45909 Unspecified asthma, uncomplicated: Secondary | ICD-10-CM

## 2012-09-08 MED ORDER — OMALIZUMAB 150 MG ~~LOC~~ SOLR
150.0000 mg | Freq: Once | SUBCUTANEOUS | Status: AC
  Administered 2012-09-08: 150 mg via SUBCUTANEOUS

## 2012-09-19 ENCOUNTER — Ambulatory Visit
Admission: RE | Admit: 2012-09-19 | Discharge: 2012-09-19 | Disposition: A | Discharge status: Home / Self Care | Payer: Medicare Other | Source: Ambulatory Visit | Attending: Obstetrics and Gynecology | Admitting: Obstetrics and Gynecology

## 2012-09-19 DIAGNOSIS — M810 Age-related osteoporosis without current pathological fracture: Secondary | ICD-10-CM

## 2012-09-19 DIAGNOSIS — Z1231 Encounter for screening mammogram for malignant neoplasm of breast: Secondary | ICD-10-CM

## 2012-09-19 DIAGNOSIS — Z853 Personal history of malignant neoplasm of breast: Secondary | ICD-10-CM

## 2012-09-19 DIAGNOSIS — Z9889 Other specified postprocedural states: Secondary | ICD-10-CM

## 2012-09-19 DIAGNOSIS — M8580 Other specified disorders of bone density and structure, unspecified site: Secondary | ICD-10-CM

## 2012-09-23 ENCOUNTER — Encounter: Payer: Self-pay | Admitting: Family Medicine

## 2012-09-29 ENCOUNTER — Telehealth: Payer: Self-pay | Admitting: Family Medicine

## 2012-09-29 NOTE — Telephone Encounter (Signed)
1000 is sufficient

## 2012-09-29 NOTE — Telephone Encounter (Signed)
Pt received a letter from [place] about her bone density telling her to take 2000 units of Vit D3 and 1200 of Calcium. She also received a letter from [facility] about her bone density telling her to take 1000 units of Vit D3 and 1200-1500 of Calcium.  She used to be on 2000 units of Vit D3, but then Dr. Christell Constant changed her to 1000 units of Vit D3 because of some study that came out saying that too much a day of it could be harmful to her.  She would like some clarification on what you think she should be taking.

## 2012-09-29 NOTE — Telephone Encounter (Signed)
Patient aware.

## 2012-10-01 ENCOUNTER — Ambulatory Visit: Payer: Medicare Other

## 2012-10-02 ENCOUNTER — Ambulatory Visit (INDEPENDENT_AMBULATORY_CARE_PROVIDER_SITE_OTHER): Payer: Medicare Other

## 2012-10-02 DIAGNOSIS — J45909 Unspecified asthma, uncomplicated: Secondary | ICD-10-CM

## 2012-10-13 MED ORDER — OMALIZUMAB 150 MG ~~LOC~~ SOLR
150.0000 mg | Freq: Once | SUBCUTANEOUS | Status: AC
  Administered 2012-10-13: 150 mg via SUBCUTANEOUS

## 2012-10-28 ENCOUNTER — Other Ambulatory Visit: Payer: Self-pay | Admitting: Family Medicine

## 2012-10-29 ENCOUNTER — Encounter: Payer: Self-pay | Admitting: Family Medicine

## 2012-10-29 ENCOUNTER — Ambulatory Visit (INDEPENDENT_AMBULATORY_CARE_PROVIDER_SITE_OTHER): Payer: Medicare Other | Admitting: Family Medicine

## 2012-10-29 VITALS — BP 120/60 | HR 80 | Temp 97.3°F | Resp 20 | Wt 109.0 lb

## 2012-10-29 DIAGNOSIS — J019 Acute sinusitis, unspecified: Secondary | ICD-10-CM | POA: Insufficient documentation

## 2012-10-29 DIAGNOSIS — R42 Dizziness and giddiness: Secondary | ICD-10-CM | POA: Insufficient documentation

## 2012-10-29 MED ORDER — AZITHROMYCIN 250 MG PO TABS: ORAL_TABLET | ORAL | Status: DC

## 2012-10-29 NOTE — Assessment & Plan Note (Signed)
I think that this is secondary to the sinus pressure and drainage. She does have Antivert at home but she is to hold off on this as this causes a lot of somnolence for her and we will treat the sinuses first.

## 2012-10-29 NOTE — Assessment & Plan Note (Signed)
I think her sinus pressure and drainage is causing some of her dizzy spells. I think this is started out secondary to the dust in irritants in the air as she is very susceptible to allergens. I will have her use her Mucinex D twice a day she typically takes in the morning. Continue all other allergy medications. If she does not improve by Friday I have sent over azithromycin which is one of the few antibiotics it she's not allergic to.

## 2012-10-29 NOTE — Patient Instructions (Signed)
Use Mucinex D twice a day for next 3 days Put off xolair shot for 1 week Start zpak Friday if not improved Plenty of fluids

## 2012-10-29 NOTE — Progress Notes (Signed)
  Subjective:    Patient ID: Ebony Scott, female    DOB: 09/25/1950, 62 y.o.   MRN: [id number]  HPI  Patient here with sinus pressure and drainage dizziness for the past 4 days. She has history of severe asthma as well as severe allergies she's currently on Xolair and multiple other allergy medications including nasal sprays as well as Singulair and allergy pills. She's had her home cleaned out secondary to some damage and there was mold noted in the floor and on the X. The home that which is been cleaned up by professionals. She states she was on the other side of the home cleaning up some dust and debris when she bent over and had a dizzy episode on Monday and Tuesday. She's not had any dizzy episodes today. She denies any headache or shortness of breath the   Review of Systems - per above    GEN- + fatigue, fever, weight loss,weakness, recent illness HEENT- denies eye drainage, change in vision, +nasal discharge, CVS- denies chest pain, palpitations RESP- denies SOB, cough, wheeze Neuro- denies headache, +dizziness, syncope, seizure activity       Objective:   Physical Exam  GEN- NAD, alert and oriented x3, fatigued appearing HEENT- PERRL, EOMI, non injected sclera, pink conjunctiva, MMM, oropharynxclear, TM clear bilat no effusion, + maxillary sinus tenderness, inflammed turbinates,  Nasal drainage  Neck- Supple, no LAD CVS- RRR, no murmur RESP-CTAB, no wheeze EXT- No edema Pulses- Radial 2+ Neuro- CNII-XII in tact, no focal deficits        Assessment & Plan:

## 2012-10-30 ENCOUNTER — Ambulatory Visit: Payer: Medicare Other

## 2012-10-31 ENCOUNTER — Encounter: Payer: Self-pay | Admitting: Family Medicine

## 2012-10-31 ENCOUNTER — Ambulatory Visit (INDEPENDENT_AMBULATORY_CARE_PROVIDER_SITE_OTHER): Payer: Medicare Other | Admitting: Family Medicine

## 2012-10-31 VITALS — BP 136/68 | HR 86 | Temp 98.3°F | Resp 16 | Wt 109.0 lb

## 2012-10-31 DIAGNOSIS — H8110 Benign paroxysmal vertigo, unspecified ear: Secondary | ICD-10-CM

## 2012-10-31 NOTE — Progress Notes (Signed)
Subjective:    Patient ID: Ebony Scott, female    DOB: 01/09/1951, 62 y.o.   MRN: [id number]  HPI  Patient reports one week of vertigo with turning her head or standing up. The room begins to spin she becomes nauseated. She experiences no nystagmus. The symptoms last seconds to minutes. They've been gradually subside. They're then re\re triggered by head motion or position changes. She denies any unilateral hearing loss. She denies any tinnitus. She denies any fever. She denies any ear pain. Past Medical History  Diagnosis Date  . Chronic neck pain   . Allergic rhinitis   . Moderate persistent asthma     -FeV1 72% 2011, -IgE 102 2011, CT sinus Neg 2011  . GERD (gastroesophageal reflux disease)   . Hypothyroidism   . Osteoporosis     on reclast yearly  . Hiatal hernia   . Diverticulosis   . Hemorrhoids   . Asthma   . IBS (irritable bowel syndrome)   . Complication of anesthesia     "had hard time waking up from it several times" (02/20/2012)  . Hypercholesteremia   . Pneumonia 04/2011; ~ 11/2011    "double; single" (02/20/2012)  . Exertional dyspnea   . Graves disease   . Headache(784.0)     "related to allergies; more at different times during the year" (02/20/2012)  . Arthritis     "mostly the hands" (02/20/2012)  . Fibromyalgia 11/2011  . Chronic lower back pain   . Depression     "some; don't take anything for it" (02/20/2012)  . Breast cancer 1998    in remission  . Anxiety   . DVT (deep venous thrombosis)    Current Outpatient Prescriptions on File Prior to Visit  Medication Sig Dispense Refill  . acetaminophen (TYLENOL ARTHRITIS PAIN) 650 MG CR tablet Take 1,300 mg by mouth 2 (two) times daily.        . Alpha-D-Galactosidase (BEANO) TABS Take 1 tablet by mouth daily.       . Alpha-Lipoic Acid 600 MG CAPS Take 600 mg by mouth daily. Once a day      . Alum & Mag Hydroxide-Simeth (MAGIC MOUTHWASH) SOLN Take 5-10 mLs by mouth 3 (three) times daily as needed. For irritation       . Azelastine HCl 0.15 % SOLN Place 1 spray into the nose daily.      Marland Kitchen azithromycin (ZITHROMAX) 250 MG tablet Take 2 tablet x 1 day, then 1 tablet daily for 4 days  6 tablet  1  . Calcium Citrate-Vitamin D (CITRACAL MAXIMUM) 315-250 MG-UNIT TABS Take 1 tablet by mouth 2 (two) times daily.       . Cholecalciferol (VITAMIN D) 2000 UNITS CAPS Take 0.5 capsules by mouth at bedtime. 1/2 at bedtime      . CREON 24000 UNITS CPEP TAKE 1 CAPSULE BY MOUTH DAILY 3-4 TIMES DAILY(WITH EACH MEAL)  180 each  2  . EPINEPHrine (EPIPEN JR) 0.15 MG/0.3ML injection Inject 0.3 mLs (0.15 mg total) into the muscle as needed. For reaction  1 each  4  . gabapentin (NEURONTIN) 600 MG tablet Take 600 mg by mouth daily. Once a day      . guaiFENesin (MUCINEX) 600 MG 12 hr tablet Take 600 mg by mouth daily at 2 PM daily at 2 PM. PM      . lactase (LACTAID) 3000 UNITS tablet Take 3 tablets by mouth as needed. 3-4 tabs with dairy liquids and solids      .  levocetirizine (XYZAL) 5 MG tablet Take 5 mg by mouth daily.      Marland Kitchen lidocaine (LIDODERM) 5 % Place 1 patch onto the skin daily as needed. Remove & Discard patch within 12 hours or as directed by MD      . Melatonin 3 MG CAPS Take 9 mg by mouth at bedtime. 3 tabs at bedtime      . mometasone (NASONEX) 50 MCG/ACT nasal spray Place 2 sprays into the nose daily. HOLD for one week then resume      . mometasone-formoterol (DULERA) 100-5 MCG/ACT AERO Inhale 1 puff into the lungs 2 (two) times daily.       . montelukast (SINGULAIR) 10 MG tablet Take 10 mg by mouth Daily.       Marland Kitchen neomycin-bacitracin-polymyxin (NEOSPORIN) ointment Apply 1 application topically every 12 (twelve) hours. Use on/in nose as directed as needed      . nystatin-triamcinolone (MYCOLOG II) cream Apply 1 application topically 2 (two) times daily as needed. For irritation      . omalizumab (XOLAIR) 150 MG injection Inject 150 mg into the skin every 28 (twenty-eight) days. 150 SQ every 4 weeks      . Polyethyl  Glycol-Propyl Glycol (SYSTANE ULTRA) 0.4-0.3 % SOLN Apply 1 drop to eye 2 (two) times daily as needed. 1-2 drops each eye every morning and at bedtime      . Prenatal Vit-Sel-Fe Fum-FA (VINATE M) 27-1 MG TABS Take 1 tablet by mouth daily.      . Probiotic Product (ALIGN) 4 MG CAPS Take 1 tablet by mouth daily. Once a day      . PROCTOZONE-HC 2.5 % rectal cream Apply as needed as directed      . propylthiouracil (PTU) 50 MG tablet TAKE ONE TABLET BY MOUTH TWICE DAILY  60 tablet  5  . pseudoephedrine-guaifenesin (MUCINEX D) 60-600 MG per tablet Take 1 tablet by mouth daily.      . Simethicone (GAS-X EXTRA STRENGTH) 125 MG CAPS Take 2 each by mouth daily as needed. For gas relief      . Sodium Chloride-Sodium Bicarb (CLASSIC NETI POT SINUS WASH NA) Place into the nose. Daily After AM walks      . [DISCONTINUED] methimazole (TAPAZOLE) 5 MG tablet Take 5 mg by mouth 3 (three) times daily.         No current facility-administered medications on file prior to visit.   Allergies  Allergen Reactions  . Dust Mite Extract Shortness Of Breath and Other (See Comments)    "sneezing" (02/20/2012)  . Molds & Smuts Shortness Of Breath  . Morphine Sulfate Itching  . Other Shortness Of Breath and Other (See Comments)    Grass and weeds "sneezing; filled sinuses" (02/20/2012)  . Penicillins Rash and Other (See Comments)    "welts" (02/20/2012)  . Reclast [Zoledronic Acid]     Put in hospital  . Rofecoxib Swelling    Celebrex REACTION: feet swelling  . Shrimp Flavor Anaphylaxis    ALL SHELLFISH  . Tetracycline Hcl Nausea And Vomiting  . Dilaudid [Hydromorphone Hcl] Itching  . Hydrocodone-Acetaminophen Nausea And Vomiting  . Levofloxacin Other (See Comments)    REACTION: GI upset  . Oxycodone Hcl Nausea And Vomiting  . Paroxetine Nausea And Vomiting    Paxil   . Soy Allergy Other (See Comments)    Limited soy d/t "ESTROGEN POSITIVE" cancer  . Tree Extract Other (See Comments)    "tested and told I was  allergic to  it; never experienced a reaction to it" (02/20/2012)   History   Social History  . Marital Status: Divorced    Spouse Name: N/A    Number of Children: N/A  . Years of Education: N/A   Occupational History  . Disabled     Retired Audiological scientist   Social History Main Topics  . Smoking status: Never Smoker   . Smokeless tobacco: Never Used  . Alcohol Use: 0.0 oz/week     Comment: 02/20/2012 "couple glasses of wine/6 months"  . Drug Use: No  . Sexual Activity: No   Other Topics Concern  . Not on file   Social History Narrative  . No narrative on file     Review of Systems  All other systems reviewed and are negative.       Objective:   Physical Exam  Vitals reviewed. Constitutional: She is oriented to person, place, and time.  HENT:  Right Ear: External ear normal.  Left Ear: External ear normal.  Nose: Nose normal.  Mouth/Throat: Oropharynx is clear and moist. No oropharyngeal exudate.  Eyes: Conjunctivae and EOM are normal. Pupils are equal, round, and reactive to light.  Neck: Neck supple.  Cardiovascular: Normal rate, regular rhythm and normal heart sounds.   Pulmonary/Chest: Effort normal and breath sounds normal. No respiratory distress. She has no wheezes. She has no rales.  Lymphadenopathy:    She has no cervical adenopathy.  Neurological: She is alert and oriented to person, place, and time. She has normal reflexes. She displays normal reflexes. No cranial nerve deficit. She exhibits normal muscle tone. Coordination normal.   Dix-Hallpike maneuver is positive by turning her head to the left. She experiences vertical nystagmus with the maneuver..        Assessment & Plan:  1. BPPV (benign paroxysmal positional vertigo) I tried to reassure the patient that she would be okay. I anticipate self-limiting resolution of her period of one to 2 weeks. I recommended meclizine 25 mg by mouth every 6 hours when necessary vertigo. Also gave  the patient a handout on how to perform Epley maneuvers at home.  She would still like to see her ENT doctor for a second opinion which I will gladly arrange.

## 2012-11-05 ENCOUNTER — Ambulatory Visit (INDEPENDENT_AMBULATORY_CARE_PROVIDER_SITE_OTHER): Payer: Medicare Other

## 2012-11-05 ENCOUNTER — Telehealth: Payer: Self-pay | Admitting: Internal Medicine

## 2012-11-05 DIAGNOSIS — J45909 Unspecified asthma, uncomplicated: Secondary | ICD-10-CM

## 2012-11-05 MED ORDER — OMALIZUMAB 150 MG ~~LOC~~ SOLR
150.0000 mg | Freq: Once | SUBCUTANEOUS | Status: AC
  Administered 2012-11-05: 150 mg via SUBCUTANEOUS

## 2012-11-05 NOTE — Telephone Encounter (Signed)
Pt is aware that she can come in for her Xolair injection. Nothing further was needed.

## 2012-11-05 NOTE — Telephone Encounter (Signed)
Called, spoke with pt.  She is on her 2nd zpak from Dr. Caren Macadam office for a sinus infection, has recently started meclizine 25 mg 1 tab qid, and took 1 difflucan this morning d/t signs of a yeast infection from abx.  Pt is scheduled to come in this morning for xolair.  She would like to know, prior to coming in, if it is still ok to get xolair injection given above meds she is taking.  Dr. Maple Hudson, pls advise.  Thank you.

## 2012-11-18 ENCOUNTER — Other Ambulatory Visit: Payer: Self-pay | Admitting: Family Medicine

## 2012-11-18 DIAGNOSIS — H8110 Benign paroxysmal vertigo, unspecified ear: Secondary | ICD-10-CM

## 2012-11-25 ENCOUNTER — Other Ambulatory Visit: Payer: Self-pay | Admitting: Family Medicine

## 2012-11-25 NOTE — Telephone Encounter (Signed)
Medication refilled per protocol. 

## 2012-12-03 ENCOUNTER — Ambulatory Visit (INDEPENDENT_AMBULATORY_CARE_PROVIDER_SITE_OTHER): Payer: Medicare Other | Admitting: Neurology

## 2012-12-03 ENCOUNTER — Encounter: Payer: Self-pay | Admitting: Neurology

## 2012-12-03 ENCOUNTER — Ambulatory Visit (INDEPENDENT_AMBULATORY_CARE_PROVIDER_SITE_OTHER): Payer: Medicare Other

## 2012-12-03 VITALS — BP 136/68 | HR 63 | Ht 62.0 in | Wt 111.0 lb

## 2012-12-03 DIAGNOSIS — H8110 Benign paroxysmal vertigo, unspecified ear: Secondary | ICD-10-CM

## 2012-12-03 DIAGNOSIS — J45909 Unspecified asthma, uncomplicated: Secondary | ICD-10-CM

## 2012-12-03 MED ORDER — OMALIZUMAB 150 MG ~~LOC~~ SOLR
150.0000 mg | Freq: Once | SUBCUTANEOUS | Status: AC
  Administered 2012-12-03: 150 mg via SUBCUTANEOUS

## 2012-12-03 NOTE — Patient Instructions (Signed)
Benign Positional Vertigo Vertigo means you feel like you or your surroundings are moving when they are not. Benign positional vertigo is the most common form of vertigo. Benign means that the cause of your condition is not serious. Benign positional vertigo is more common in older adults. CAUSES  Benign positional vertigo is the result of an upset in the labyrinth system. This is an area in the middle ear that helps control your balance. This may be caused by a viral infection, head injury, or repetitive motion. However, often no specific cause is found. SYMPTOMS  Symptoms of benign positional vertigo occur when you move your head or eyes in different directions. Some of the symptoms may include:  Loss of balance and falls.  Vomiting.  Blurred vision.  Dizziness.  Nausea.  Involuntary eye movements (nystagmus). DIAGNOSIS  Benign positional vertigo is usually diagnosed by physical exam. If the specific cause of your benign positional vertigo is unknown, your caregiver may perform imaging tests, such as magnetic resonance imaging (MRI) or computed tomography (CT). TREATMENT  Your caregiver may recommend movements or procedures to correct the benign positional vertigo. Medicines such as meclizine, benzodiazepines, and medicines for nausea may be used to treat your symptoms. In rare cases, if your symptoms are caused by certain conditions that affect the inner ear, you may need surgery. HOME CARE INSTRUCTIONS   Follow your caregiver's instructions.  Move slowly. Do not make sudden body or head movements.  Avoid driving.  Avoid operating heavy machinery.  Avoid performing any tasks that would be dangerous to you or others during a vertigo episode.  Drink enough fluids to keep your urine clear or pale yellow. SEEK IMMEDIATE MEDICAL CARE IF:   You develop problems with walking, weakness, numbness, or using your arms, hands, or legs.  You have difficulty speaking.  You develop  severe headaches.  Your nausea or vomiting continues or gets worse.  You develop visual changes.  Your family or friends notice any behavioral changes.  Your condition gets worse.  You have a fever.  You develop a stiff neck or sensitivity to light. MAKE SURE YOU:   Understand these instructions.  Will watch your condition.  Will get help right away if you are not doing well or get worse. Document Released: 11/13/2005 Document Revised: 04/30/2011 Document Reviewed: 10/26/2010 [employer] Patient Information 2014 [employer]  

## 2012-12-03 NOTE — Progress Notes (Signed)
[facility]  PATIENT: Ebony Scott DOB: 02/07/1951  HISTORICAL  Ebony Scott is a 62 years old right-handed Caucasian female, referred by her primary care physician Dr. Tanya Nones for evaluation of vertigo.  She had a past medical history of asthma, hypothyroidism, history of breast cancer,  She began to have vertigo since September 2014, she got up, leaning towards the left side, had a sudden onset of dizzy spells, nystagmus, improved by lying still, she was given Meclazine, her symptoms have much improved afterwards.  But with sudden positional change, such as turning her head leaning her body forward, turning to the left side,  she still has transient vertigo, she denies hearing loss, no significant gait difficulty.  She had a similar episode about couple years ago, lasting for a few weeks, it was also happened when she was turning towards the left side,   REVIEW OF SYSTEMS: Full 14 system review of systems performed and notable only for fatigue, spinning sensation shortness of breath, easy bruising, joint pain, joint swelling, allergy, headaches, dizziness, insomnia, decreased energy, disinterested in activities  ALLERGIES: Allergies  Allergen Reactions  . Dust Mite Extract Shortness Of Breath and Other (See Comments)    "sneezing" (02/20/2012)  . Molds & Smuts Shortness Of Breath  . Morphine Sulfate Itching  . Other Shortness Of Breath and Other (See Comments)    Grass and weeds "sneezing; filled sinuses" (02/20/2012)  . Penicillins Rash and Other (See Comments)    "welts" (02/20/2012)  . Reclast [Zoledronic Acid]     Put in hospital  . Rofecoxib Swelling    Celebrex REACTION: feet swelling  . Shrimp Flavor Anaphylaxis    ALL SHELLFISH  . Tetracycline Hcl Nausea And Vomiting  . Dilaudid [Hydromorphone Hcl] Itching  . Hydrocodone-Acetaminophen Nausea And Vomiting  . Levofloxacin Other (See Comments)    REACTION: GI upset  . Oxycodone Hcl Nausea And Vomiting  .  Paroxetine Nausea And Vomiting    Paxil   . Soy Allergy Other (See Comments)    Limited soy d/t "ESTROGEN POSITIVE" cancer  . Tree Extract Other (See Comments)    "tested and told I was allergic to it; never experienced a reaction to it" (02/20/2012)    HOME MEDICATIONS: Outpatient Prescriptions Prior to Visit  Medication Sig Dispense Refill  . acetaminophen (TYLENOL ARTHRITIS PAIN) 650 MG CR tablet Take 1,300 mg by mouth 2 (two) times daily.        . Alpha-D-Galactosidase (BEANO) TABS Take 1 tablet by mouth daily.       . Alpha-Lipoic Acid 600 MG CAPS Take 600 mg by mouth daily. Once a day      . Alum & Mag Hydroxide-Simeth (MAGIC MOUTHWASH) SOLN Take 5-10 mLs by mouth 3 (three) times daily as needed. For irritation      . Azelastine HCl 0.15 % SOLN Place 1 spray into the nose daily.      Marland Kitchen azithromycin (ZITHROMAX) 250 MG tablet Take 2 tablet x 1 day, then 1 tablet daily for 4 days  6 tablet  1  . Calcium Citrate-Vitamin D (CITRACAL MAXIMUM) 315-250 MG-UNIT TABS Take 1 tablet by mouth 2 (two) times daily.       . Cholecalciferol (VITAMIN D) 2000 UNITS CAPS Take 0.5 capsules by mouth at bedtime. 1/2 at bedtime      . CREON 24000 UNITS CPEP TAKE 1 CAPSULE BY MOUTH DAILY 3-4 TIMES DAILY(WITH EACH MEAL)  180 each  2  . CREON 24000 UNITS CPEP TAKE  1 CAPSULE BY MOUTH DAILY 3-4 TIMES DAILY(WITH EACH MEAL)  180 each  11  . EPINEPHrine (EPIPEN JR) 0.15 MG/0.3ML injection Inject 0.3 mLs (0.15 mg total) into the muscle as needed. For reaction  1 each  4  . gabapentin (NEURONTIN) 600 MG tablet Take 600 mg by mouth daily. Once a day      . guaiFENesin (MUCINEX) 600 MG 12 hr tablet Take 600 mg by mouth daily at 2 PM daily at 2 PM. PM      . lactase (LACTAID) 3000 UNITS tablet Take 3 tablets by mouth as needed. 3-4 tabs with dairy liquids and solids      . levocetirizine (XYZAL) 5 MG tablet Take 5 mg by mouth daily.      Marland Kitchen lidocaine (LIDODERM) 5 % Place 1 patch onto the skin daily as needed. Remove &  Discard patch within 12 hours or as directed by MD      . Melatonin 3 MG CAPS Take 9 mg by mouth at bedtime. 3 tabs at bedtime      . mometasone (NASONEX) 50 MCG/ACT nasal spray Place 2 sprays into the nose daily. HOLD for one week then resume      . mometasone-formoterol (DULERA) 100-5 MCG/ACT AERO Inhale 1 puff into the lungs 2 (two) times daily.       . montelukast (SINGULAIR) 10 MG tablet TAKE 1 TABLET BY MOUTH ONCE DAILY  30 tablet  11  . neomycin-bacitracin-polymyxin (NEOSPORIN) ointment Apply 1 application topically every 12 (twelve) hours. Use on/in nose as directed as needed      . nystatin-triamcinolone (MYCOLOG II) cream Apply 1 application topically 2 (two) times daily as needed. For irritation      . omalizumab (XOLAIR) 150 MG injection Inject 150 mg into the skin every 28 (twenty-eight) days. 150 SQ every 4 weeks      . Polyethyl Glycol-Propyl Glycol (SYSTANE ULTRA) 0.4-0.3 % SOLN Apply 1 drop to eye 2 (two) times daily as needed. 1-2 drops each eye every morning and at bedtime      . Prenatal Vit-Sel-Fe Fum-FA (VINATE M) 27-1 MG TABS Take 1 tablet by mouth daily.      . Probiotic Product (ALIGN) 4 MG CAPS Take 1 tablet by mouth daily. Once a day      . PROCTOZONE-HC 2.5 % rectal cream Apply as needed as directed      . propylthiouracil (PTU) 50 MG tablet TAKE 1 TABLET BY MOUTH TWICE DAILY  60 tablet  11  . pseudoephedrine-guaifenesin (MUCINEX D) 60-600 MG per tablet Take 1 tablet by mouth daily.      . Simethicone (GAS-X EXTRA STRENGTH) 125 MG CAPS Take 2 each by mouth daily as needed. For gas relief      . Sodium Chloride-Sodium Bicarb (CLASSIC NETI POT SINUS WASH NA) Place into the nose. Daily After AM walks       No facility-administered medications prior to visit.    PAST MEDICAL HISTORY: Past Medical History  Diagnosis Date  . Chronic neck pain   . Allergic rhinitis   . Moderate persistent asthma     -FeV1 72% 2011, -IgE 102 2011, CT sinus Neg 2011  . GERD  (gastroesophageal reflux disease)   . Hypothyroidism   . Osteoporosis     on reclast yearly  . Hiatal hernia   . Diverticulosis   . Hemorrhoids   . Asthma   . IBS (irritable bowel syndrome)   . Complication of anesthesia     "  had hard time waking up from it several times" (02/20/2012)  . Hypercholesteremia   . Pneumonia 04/2011; ~ 11/2011    "double; single" (02/20/2012)  . Exertional dyspnea   . Graves disease   . Headache(784.0)     "related to allergies; more at different times during the year" (02/20/2012)  . Arthritis     "mostly the hands" (02/20/2012)  . Fibromyalgia 11/2011  . Chronic lower back pain   . Depression     "some; don't take anything for it" (02/20/2012)  . Breast cancer 1998    in remission  . Anxiety   . DVT (deep venous thrombosis)     PAST SURGICAL HISTORY: Past Surgical History  Procedure Laterality Date  . Cervical fusion  2003    C3-C4  . Cervical discectomy  10/2001    C5-C6  . Vesicovaginal fistula closure w/ tah  1988  . Nasal septum surgery  1980's  . Anterior and posterior repair  1990's  . Breast lumpectomy  1998    left  . Debridement tennis elbow  ?1970's    right  . Knee arthroplasty  ?1990's    "?right; w/cartilage repair" (02/20/2012)  . Carpometacarpel (cmc) fusion of thumb with autograft from radius  ~ 2009    "both thumbs" (02/20/2012)  . Cataract extraction w/ intraocular lens  implant, bilateral  2012  . Tonsillectomy  ~ 1953  . Posterior cervical fusion/foraminotomy  2004    "failed initial fusion; rewired  anterior neck" (02/20/2012)    FAMILY HISTORY: Family History  Problem Relation Age of Onset  . Allergies Mother   . Allergies Father   . Heart disease Mother   . Heart disease Father   . Arthritis Mother   . Arthritis Father   . Lung cancer Mother   . Colon cancer      Maternal half uncle  . Colon cancer      Maternal half aunt  . Colitis Daughter     SOCIAL HISTORY:  History   Social History  . Marital Status:  Divorced    Spouse Name: N/A    Number of Children: 2  . Years of Education: college   Occupational History  . Disabled     Retired Audiological scientist  .     Social History Main Topics  . Smoking status: Never Smoker   . Smokeless tobacco: Never Used  . Alcohol Use: 0.0 oz/week     Comment: 02/20/2012 "couple glasses of wine/6 months"  . Drug Use: No  . Sexual Activity: No   Other Topics Concern  . Not on file   Social History Narrative   Patient lives at home alone. Patient  divorced.    Patient has her BS degree.           PHYSICAL EXAM   Filed Vitals:   12/03/12 1118  BP: 136/68  Pulse: 63  Height: 5\' 2"  (1.575 m)  Weight: 111 lb (50.349 kg)   Body mass index is 20.3 kg/(m^2).   Generalized: In no acute distress  Neck: Supple, no carotid bruits   Cardiac: Regular rate rhythm  Pulmonary: Clear to auscultation bilaterally  Musculoskeletal: No deformity  Neurological examination  Repositional maneuver: With her left ear dependent position, she had transient rotatory nystagmus, vertigo, habituate quickly,  Mentation: Alert oriented to time, place, history taking, and causual conversation  Cranial nerve II-XII: Pupils were equal round reactive to light extraocular movements were full, visual field were full on  confrontational test. facial sensation and strength were normal. hearing was intact to finger rubbing bilaterally. Uvula tongue midline.  head turning and shoulder shrug and were normal and symmetric.Tongue protrusion into cheek strength was normal.  Motor: normal tone, bulk and strength.  Sensory: Intact to fine touch, pinprick, preserved vibratory sensation, and proprioception at toes.  Coordination: Normal finger to nose, heel-to-shin bilaterally there was no truncal ataxia  Gait: Rising up from seated position without assistance, normal stance, without trunk ataxia, moderate stride, good arm swing, smooth turning, able to perform  tiptoe, and heel walking without difficulty.   Romberg signs: Negative  Deep tendon reflexes: Brachioradialis 2/2, biceps 2/2, triceps 2/2, patellar 2/2, Achilles 2/2, plantar responses were flexor bilaterally.   DIAGNOSTIC DATA (LABS, IMAGING, TESTING) - I reviewed patient records, labs, notes, testing and imaging myself where available.  Lab Results  Component Value Date   WBC 5.8 06/05/2012   HGB 12.4 06/05/2012   HCT 37.1 06/05/2012   MCV 97.4 06/05/2012   PLT 311 06/05/2012      Component Value Date/Time   NA 141 06/05/2012 0857   K 5.4* 06/05/2012 0857   CL 105 06/05/2012 0857   CO2 27 06/05/2012 0857   GLUCOSE 82 06/05/2012 0857   BUN 19 06/05/2012 0857   CREATININE 0.98 06/05/2012 0857   CREATININE 0.93 02/25/2012 0458   CALCIUM 10.6* 06/05/2012 0857   PROT 6.9 06/05/2012 0857   ALBUMIN 4.4 06/05/2012 0857   AST 31 06/05/2012 0857   ALT 21 06/05/2012 0857   ALKPHOS 41 06/05/2012 0857   BILITOT 0.6 06/05/2012 0857   GFRNONAA 64* 02/25/2012 0458   GFRAA 74* 02/25/2012 0458   Lab Results  Component Value Date   CHOL 221* 06/05/2012   HDL 79 06/05/2012   LDLCALC 115* 06/05/2012   TRIG 134 06/05/2012   CHOLHDL 2.8 06/05/2012   No results found for this basename: HGBA1C   Lab Results  Component Value Date   VITAMINB12 648 02/22/2012   Lab Results  Component Value Date   TSH 3.741 06/05/2012      ASSESSMENT AND PLAN   63 years old Caucasian female, with acute onset of vertigo, triggered by turning her head to the left side, she also complains of dizzines with sudden positional change, I was able to induce vertigo, with repositional maneuver.  1. Most consistent with benign positional vertigo. 2 I will complete evaluation with MRI of the brain with her vascular risk factor to rule out brainstem, cerebellum structural lesion 3. I will also refer her to vestibular rehabilitation    Levert Feinstein, M.D. Ph.D.  [facility] [address], [address] [place], [place]  [address] [phone]

## 2012-12-08 ENCOUNTER — Other Ambulatory Visit: Payer: Medicare Other

## 2012-12-08 DIAGNOSIS — M810 Age-related osteoporosis without current pathological fracture: Secondary | ICD-10-CM

## 2012-12-08 DIAGNOSIS — E785 Hyperlipidemia, unspecified: Secondary | ICD-10-CM

## 2012-12-08 DIAGNOSIS — E039 Hypothyroidism, unspecified: Secondary | ICD-10-CM

## 2012-12-08 DIAGNOSIS — Z79899 Other long term (current) drug therapy: Secondary | ICD-10-CM

## 2012-12-08 LAB — COMPLETE METABOLIC PANEL WITH GFR
ALT: 24 U/L (ref 0–35)
AST: 29 U/L (ref 0–37)
Albumin: 4.3 g/dL (ref 3.5–5.2)
Alkaline Phosphatase: 39 U/L (ref 39–117)
BUN: 16 mg/dL (ref 6–23)
CO2: 28 mEq/L (ref 19–32)
Calcium: 9.9 mg/dL (ref 8.4–10.5)
Chloride: 106 mEq/L (ref 96–112)
Creat: 1.03 mg/dL (ref 0.50–1.10)
GFR, Est African American: 66 mL/min
GFR, Est Non African American: 58 mL/min — ABNORMAL LOW
Glucose, Bld: 85 mg/dL (ref 70–99)
Potassium: 4.4 mEq/L (ref 3.5–5.3)
Sodium: 141 mEq/L (ref 135–145)
Total Bilirubin: 0.6 mg/dL (ref 0.3–1.2)
Total Protein: 6.8 g/dL (ref 6.0–8.3)

## 2012-12-08 LAB — LIPID PANEL
Cholesterol: 272 mg/dL — ABNORMAL HIGH (ref 0–200)
HDL: 73 mg/dL (ref >39)
LDL Cholesterol: 158 mg/dL — ABNORMAL HIGH (ref 0–99)
Total CHOL/HDL Ratio: 3.7 Ratio
Triglycerides: 205 mg/dL — ABNORMAL HIGH (ref <150)
VLDL: 41 mg/dL — ABNORMAL HIGH (ref 0–40)

## 2012-12-08 LAB — CBC WITH DIFFERENTIAL/PLATELET
Basophils Absolute: 0.1 10*3/uL (ref 0.0–0.1)
Basophils Relative: 1 % (ref 0–1)
Eosinophils Absolute: 0.4 10*3/uL (ref 0.0–0.7)
Eosinophils Relative: 7 % — ABNORMAL HIGH (ref 0–5)
HCT: 34.3 % — ABNORMAL LOW (ref 36.0–46.0)
Hemoglobin: 11.6 g/dL — ABNORMAL LOW (ref 12.0–15.0)
Lymphocytes Relative: 40 % (ref 12–46)
Lymphs Abs: 2.2 10*3/uL (ref 0.7–4.0)
MCH: 32.8 pg (ref 26.0–34.0)
MCHC: 33.8 g/dL (ref 30.0–36.0)
MCV: 96.9 fL (ref 78.0–100.0)
Monocytes Absolute: 0.7 10*3/uL (ref 0.1–1.0)
Monocytes Relative: 13 % — ABNORMAL HIGH (ref 3–12)
Neutro Abs: 2.1 10*3/uL (ref 1.7–7.7)
Neutrophils Relative %: 39 % — ABNORMAL LOW (ref 43–77)
Platelets: 232 10*3/uL (ref 150–400)
RBC: 3.54 MIL/uL — ABNORMAL LOW (ref 3.87–5.11)
RDW: 13.6 % (ref 11.5–15.5)
WBC: 5.5 10*3/uL (ref 4.0–10.5)

## 2012-12-08 LAB — TSH: TSH: 6.839 u[IU]/mL — ABNORMAL HIGH (ref 0.350–4.500)

## 2012-12-09 ENCOUNTER — Ambulatory Visit: Payer: Medicare Other | Attending: Neurology | Admitting: Physical Therapy

## 2012-12-09 DIAGNOSIS — H8110 Benign paroxysmal vertigo, unspecified ear: Secondary | ICD-10-CM | POA: Insufficient documentation

## 2012-12-09 DIAGNOSIS — IMO0001 Reserved for inherently not codable concepts without codable children: Secondary | ICD-10-CM | POA: Insufficient documentation

## 2012-12-09 DIAGNOSIS — R269 Unspecified abnormalities of gait and mobility: Secondary | ICD-10-CM | POA: Insufficient documentation

## 2012-12-09 LAB — VITAMIN D 25 HYDROXY (VIT D DEFICIENCY, FRACTURES): Vit D, 25-Hydroxy: 67 ng/mL (ref 30–89)

## 2012-12-11 ENCOUNTER — Ambulatory Visit: Payer: Medicare Other | Admitting: Physical Therapy

## 2012-12-15 ENCOUNTER — Telehealth: Payer: Self-pay | Admitting: Family Medicine

## 2012-12-15 DIAGNOSIS — E785 Hyperlipidemia, unspecified: Secondary | ICD-10-CM

## 2012-12-15 DIAGNOSIS — Z79899 Other long term (current) drug therapy: Secondary | ICD-10-CM

## 2012-12-15 DIAGNOSIS — E039 Hypothyroidism, unspecified: Secondary | ICD-10-CM

## 2012-12-15 MED ORDER — ROSUVASTATIN CALCIUM 10 MG PO TABS: 10.0000 mg | ORAL_TABLET | Freq: Every day | ORAL | Status: DC

## 2012-12-15 NOTE — Telephone Encounter (Signed)
Pt aware of labs and new meds.  Has appt tomorrow

## 2012-12-15 NOTE — Telephone Encounter (Signed)
Message copied by Donne Anon on Mon Dec 15, 2012  3:37 PM ------      Message from: Lynnea Ferrier      Created: Tue Dec 09, 2012  7:30 AM       Patient is on too much PTU.  Decrease to 25 bid and recheck in 6 weeks.  Also, LDL is up to 158.  Would she be willing to resume crestor? ------

## 2012-12-16 ENCOUNTER — Encounter: Payer: Self-pay | Admitting: Critical Care Medicine

## 2012-12-16 ENCOUNTER — Other Ambulatory Visit: Payer: Medicare Other

## 2012-12-16 ENCOUNTER — Ambulatory Visit (INDEPENDENT_AMBULATORY_CARE_PROVIDER_SITE_OTHER): Payer: Medicare Other | Admitting: Family Medicine

## 2012-12-16 ENCOUNTER — Encounter: Payer: Self-pay | Admitting: Family Medicine

## 2012-12-16 ENCOUNTER — Ambulatory Visit (INDEPENDENT_AMBULATORY_CARE_PROVIDER_SITE_OTHER): Payer: Medicare Other | Admitting: Critical Care Medicine

## 2012-12-16 VITALS — BP 142/86 | HR 68 | Temp 98.2°F | Resp 18 | Wt 110.0 lb

## 2012-12-16 VITALS — BP 116/70 | HR 70 | Temp 98.0°F | Ht 61.25 in | Wt 111.4 lb

## 2012-12-16 DIAGNOSIS — E785 Hyperlipidemia, unspecified: Secondary | ICD-10-CM

## 2012-12-16 DIAGNOSIS — J45909 Unspecified asthma, uncomplicated: Secondary | ICD-10-CM

## 2012-12-16 DIAGNOSIS — E0590 Thyrotoxicosis, unspecified without thyrotoxic crisis or storm: Secondary | ICD-10-CM

## 2012-12-16 MED ORDER — MOMETASONE FURO-FORMOTEROL FUM 100-5 MCG/ACT IN AERO: 1.0000 | INHALATION_SPRAY | Freq: Two times a day (BID) | RESPIRATORY_TRACT | Status: DC

## 2012-12-16 NOTE — Patient Instructions (Signed)
No change in medications Return 4 months 

## 2012-12-16 NOTE — Progress Notes (Signed)
Subjective:    Patient ID: Ebony Scott, female    DOB: April 25, 1950, 62 y.o.   MRN: [id number]  HPI 7 history of hyperlipidemia, K. by family history of coronary artery disease. She had quit taking the Crestor approximately one year ago. Her most recent laboratory results revealed an LDL cholesterol of 158. She also has a history of hypothyroidism. She is currently taking PTU 50 mg by mouth twice a day. Her most recent TSH was elevated. She reports fatigue. Lab on 12/08/2012  Component Date Value Range Status  . Cholesterol 12/08/2012 272* 0 - 200 mg/dL Final   Comment: ATP III Classification:                                < 200        mg/dL        Desirable                               200 - 239     mg/dL        Borderline High                               >= 240        mg/dL        High                             . Triglycerides 12/08/2012 205* <150 mg/dL Final  . HDL 57/84/6962 73  >39 mg/dL Final  . Total CHOL/HDL Ratio 12/08/2012 3.7   Final  . VLDL 12/08/2012 41* 0 - 40 mg/dL Final  . LDL Cholesterol 12/08/2012 158* 0 - 99 mg/dL Final   Comment:                            Total Cholesterol/HDL Ratio:CHD Risk                                                 Coronary Heart Disease Risk Table                                                                 Men       Women                                   1/2 Average Risk              3.4        3.3                                       Average Risk              5.0  4.4                                    2X Average Risk              9.6        7.1                                    3X Average Risk             23.4       11.0                          Use the calculated Patient Ratio above and the CHD Risk table                           to determine the patient's CHD Risk.                          ATP III Classification (LDL):                                < 100        mg/dL         Optimal                               100 -  129     mg/dL         Near or Above Optimal                               130 - 159     mg/dL         Borderline High                               160 - 189     mg/dL         High                                > 190        mg/dL         Very High                             . WBC 12/08/2012 5.5  4.0 - 10.5 K/uL Final  . RBC 12/08/2012 3.54* 3.87 - 5.11 MIL/uL Final  . Hemoglobin 12/08/2012 11.6* 12.0 - 15.0 g/dL Final  . HCT 16/11/9602 34.3* 36.0 - 46.0 % Final  . MCV 12/08/2012 96.9  78.0 - 100.0 fL Final  . MCH 12/08/2012 32.8  26.0 - 34.0 pg Final  . MCHC 12/08/2012 33.8  30.0 - 36.0 g/dL Final  . RDW 54/10/8117 13.6  11.5 - 15.5 % Final  . Platelets 12/08/2012 232  150 - 400 K/uL Final  . Neutrophils Relative % 12/08/2012 39* 43 - 77 % Final  .  Neutro Abs 12/08/2012 2.1  1.7 - 7.7 K/uL Final  . Lymphocytes Relative 12/08/2012 40  12 - 46 % Final  . Lymphs Abs 12/08/2012 2.2  0.7 - 4.0 K/uL Final  . Monocytes Relative 12/08/2012 13* 3 - 12 % Final  . Monocytes Absolute 12/08/2012 0.7  0.1 - 1.0 K/uL Final  . Eosinophils Relative 12/08/2012 7* 0 - 5 % Final  . Eosinophils Absolute 12/08/2012 0.4  0.0 - 0.7 K/uL Final  . Basophils Relative 12/08/2012 1  0 - 1 % Final  . Basophils Absolute 12/08/2012 0.1  0.0 - 0.1 K/uL Final  . Smear Review 12/08/2012 Criteria for review not met   Final  . TSH 12/08/2012 6.839* 0.350 - 4.500 uIU/mL Final  . Vit D, 25-Hydroxy 12/08/2012 67  30 - 89 ng/mL Final   Comment: This assay accurately quantifies Vitamin D, which is the sum of the                          25-Hydroxy forms of Vitamin D2 and D3.  Studies have shown that the                          optimum concentration of 25-Hydroxy Vitamin D is 30 ng/mL or higher.                           Concentrations of Vitamin D between 20 and 29 ng/mL are considered to                          be insufficient and concentrations less than 20 ng/mL are considered                          to be deficient  for Vitamin D.  . Sodium 12/08/2012 141  135 - 145 mEq/L Final  . Potassium 12/08/2012 4.4  3.5 - 5.3 mEq/L Final  . Chloride 12/08/2012 106  96 - 112 mEq/L Final  . CO2 12/08/2012 28  19 - 32 mEq/L Final  . Glucose, Bld 12/08/2012 85  70 - 99 mg/dL Final  . BUN 16/11/9602 16  6 - 23 mg/dL Final  . Creat 54/10/8117 1.03  0.50 - 1.10 mg/dL Final  . Total Bilirubin 12/08/2012 0.6  0.3 - 1.2 mg/dL Final  . Alkaline Phosphatase 12/08/2012 39  39 - 117 U/L Final  . AST 12/08/2012 29  0 - 37 U/L Final  . ALT 12/08/2012 24  0 - 35 U/L Final  . Total Protein 12/08/2012 6.8  6.0 - 8.3 g/dL Final  . Albumin 14/78/2956 4.3  3.5 - 5.2 g/dL Final  . Calcium 21/30/8657 9.9  8.4 - 10.5 mg/dL Final  . GFR, Est African American 12/08/2012 66   Final  . GFR, Est Non African American 12/08/2012 58*  Final   Comment:                            The estimated GFR is a calculation valid for adults (>=90 years old)                          that uses the CKD-EPI algorithm to adjust for age and sex. It is  not to be used for children, pregnant women, hospitalized patients,                             patients on dialysis, or with rapidly changing kidney function.                          According to the NKDEP, eGFR >89 is normal, 60-89 shows mild                          impairment, 30-59 shows moderate impairment, 15-29 shows severe                          impairment and <15 is ESRD.                              Past Medical History  Diagnosis Date  . Chronic neck pain   . Allergic rhinitis   . Moderate persistent asthma     -FeV1 72% 2011, -IgE 102 2011, CT sinus Neg 2011  . GERD (gastroesophageal reflux disease)   . Hypothyroidism   . Osteoporosis     on reclast yearly  . Hiatal hernia   . Diverticulosis   . Hemorrhoids   . Asthma   . IBS (irritable bowel syndrome)   . Complication of anesthesia     "had hard time waking up from it several times" (02/20/2012)  .  Hypercholesteremia   . Pneumonia 04/2011; ~ 11/2011    "double; single" (02/20/2012)  . Exertional dyspnea   . Graves disease   . Headache(784.0)     "related to allergies; more at different times during the year" (02/20/2012)  . Arthritis     "mostly the hands" (02/20/2012)  . Fibromyalgia 11/2011  . Chronic lower back pain   . Depression     "some; don't take anything for it" (02/20/2012)  . Breast cancer 1998    in remission  . Anxiety   . DVT (deep venous thrombosis)    Current Outpatient Prescriptions on File Prior to Visit  Medication Sig Dispense Refill  . acetaminophen (TYLENOL ARTHRITIS PAIN) 650 MG CR tablet Take 1,300 mg by mouth 2 (two) times daily.        . Alpha-D-Galactosidase (BEANO) TABS Take 1 tablet by mouth daily.       . Alpha-Lipoic Acid 600 MG CAPS Take 600 mg by mouth daily. Once a day      . Alum & Mag Hydroxide-Simeth (MAGIC MOUTHWASH) SOLN Take 5-10 mLs by mouth 3 (three) times daily as needed. For irritation      . Azelastine HCl 0.15 % SOLN Place 1 spray into the nose daily.      . Calcium Citrate-Vitamin D (CITRACAL MAXIMUM) 315-250 MG-UNIT TABS Take 1 tablet by mouth 2 (two) times daily.       . Cholecalciferol (VITAMIN D) 2000 UNITS CAPS Take 0.5 capsules by mouth at bedtime. 1/2 at bedtime      . CREON 24000 UNITS CPEP TAKE 1 CAPSULE BY MOUTH DAILY 3-4 TIMES DAILY(WITH EACH MEAL)  180 each  11  . EPINEPHrine (EPIPEN JR) 0.15 MG/0.3ML injection Inject 0.3 mLs (0.15 mg total) into the muscle as needed. For reaction  1 each  4  . fluconazole (DIFLUCAN) 150 MG tablet Take 150  mg by mouth daily as needed.       . gabapentin (NEURONTIN) 600 MG tablet Take 600 mg by mouth daily as needed.       . lactase (LACTAID) 3000 UNITS tablet Take 3 tablets by mouth as needed. 3-4 tabs with dairy liquids and solids      . levocetirizine (XYZAL) 5 MG tablet Take 5 mg by mouth daily.      Marland Kitchen lidocaine (LIDODERM) 5 % Place 1 patch onto the skin daily as needed. Remove & Discard  patch within 12 hours or as directed by MD      . Melatonin 3 MG CAPS Take 9 mg by mouth at bedtime. 3 tabs at bedtime      . mometasone (NASONEX) 50 MCG/ACT nasal spray Place 2 sprays into the nose daily.       . mometasone-formoterol (DULERA) 100-5 MCG/ACT AERO Inhale 1 puff into the lungs 2 (two) times daily.  1 Inhaler  6  . montelukast (SINGULAIR) 10 MG tablet TAKE 1 TABLET BY MOUTH ONCE DAILY  30 tablet  11  . neomycin-bacitracin-polymyxin (NEOSPORIN) ointment Apply 1 application topically every 12 (twelve) hours. Use on/in nose as directed as needed      . nystatin-triamcinolone (MYCOLOG II) cream Apply 1 application topically 2 (two) times daily as needed. For irritation      . omalizumab (XOLAIR) 150 MG injection Inject 150 mg into the skin every 28 (twenty-eight) days. 150 SQ every 4 weeks      . Polyethyl Glycol-Propyl Glycol (SYSTANE ULTRA) 0.4-0.3 % SOLN Apply 1 drop to eye 2 (two) times daily as needed. 1-2 drops each eye every morning and at bedtime      . Prenatal Vit-Sel-Fe Fum-FA (VINATE M) 27-1 MG TABS Take 1 tablet by mouth daily.      . Probiotic Product (ALIGN) 4 MG CAPS Take 1 tablet by mouth daily. Once a day      . PROCTOZONE-HC 2.5 % rectal cream Apply as needed as directed      . propylthiouracil (PTU) 50 MG tablet take 1/2 tablet (25 mg) twice daily      . pseudoephedrine-guaifenesin (MUCINEX D) 60-600 MG per tablet Take 1 tablet by mouth every 12 (twelve) hours.       . rosuvastatin (CRESTOR) 10 MG tablet Take 1 tablet (10 mg total) by mouth daily.  30 tablet  1  . Simethicone (GAS-X EXTRA STRENGTH) 125 MG CAPS Take 2 each by mouth daily as needed. For gas relief      . Sodium Chloride-Sodium Bicarb (CLASSIC NETI POT SINUS WASH NA) Place into the nose. Daily After AM walks      . [DISCONTINUED] methimazole (TAPAZOLE) 5 MG tablet Take 5 mg by mouth 3 (three) times daily.         No current facility-administered medications on file prior to visit.   Allergies   Allergen Reactions  . Dust Mite Extract Shortness Of Breath and Other (See Comments)    "sneezing" (02/20/2012)  . Molds & Smuts Shortness Of Breath  . Morphine Sulfate Itching  . Other Shortness Of Breath and Other (See Comments)    Grass and weeds "sneezing; filled sinuses" (02/20/2012)  . Penicillins Rash and Other (See Comments)    "welts" (02/20/2012)  . Reclast [Zoledronic Acid]     Put in hospital  . Rofecoxib Swelling    Celebrex REACTION: feet swelling  . Shrimp Flavor Anaphylaxis    ALL SHELLFISH  . Tetracycline Hcl  Nausea And Vomiting  . Dilaudid [Hydromorphone Hcl] Itching  . Hydrocodone-Acetaminophen Nausea And Vomiting  . Levofloxacin Other (See Comments)    REACTION: GI upset  . Oxycodone Hcl Nausea And Vomiting  . Paroxetine Nausea And Vomiting    Paxil   . Soy Allergy Other (See Comments)    Limited soy d/t "ESTROGEN POSITIVE" cancer  . Tree Extract Other (See Comments)    "tested and told I was allergic to it; never experienced a reaction to it" (02/20/2012)   History   Social History  . Marital Status: Divorced    Spouse Name: N/A    Number of Children: 2  . Years of Education: college   Occupational History  . Disabled     Retired Audiological scientist  .     Social History Main Topics  . Smoking status: Never Smoker   . Smokeless tobacco: Never Used  . Alcohol Use: 0.0 oz/week     Comment: 02/20/2012 "couple glasses of wine/6 months"  . Drug Use: No  . Sexual Activity: No   Other Topics Concern  . Not on file   Social History Narrative   Patient lives at home alone. Patient  divorced.    Patient has her BS degree.            Review of Systems  All other systems reviewed and are negative.       Objective:   Physical Exam  Vitals reviewed. Constitutional: She appears well-developed and well-nourished.  Neck: Neck supple. No thyromegaly present.  Cardiovascular: Normal rate, regular rhythm and normal heart sounds.   No  murmur heard. Pulmonary/Chest: Effort normal and breath sounds normal. No respiratory distress. She has no wheezes. She has no rales.  Abdominal: Soft. Bowel sounds are normal.  Musculoskeletal: She exhibits no edema.          Assessment & Plan:  1. HLD (hyperlipidemia) Resume Crestor. Recheck fasting lipid panel in 3 months. Decrease saturated fat in diet. Decrease consumption of fatty meats. 2. Hyperthyroidism Decreased PTU 25 mg by mouth twice a day. Recheck TSH in 6 weeks. If still elevated discontinue PTU.

## 2012-12-16 NOTE — Progress Notes (Signed)
Subjective:    Patient ID: Ebony Scott, female    DOB: 08-04-1950, 62 y.o.   MRN: [id number]  HPI  62 y.o.. WF atopic moderate persistent asthma, elevated IgE. Started Xolair 05/03/10  Initial Pulmonary Consultation 12/23/09  12/16/2012 Chief Complaint  Patient presents with  . Follow-up    Has good days and bad days.  Peak flow readings range from 250-280.  Has left side back sharp/dull pain with inhalation and chest tightness.  Coughing some in the evenings - occas prod with yellow mucus.  Pt notes L sided pain sharp, worse with deep breath.  No muscle soreness. Coughs some every night. Most of house repairs repeat, more mold in carpet.   Best PFR 300, drifting into 250.  Not using humidifier yet.   Pt got emycin/diflucan in 10/2012 per PCP.  Now off meclizine   Past Medical History  Diagnosis Date  . Chronic neck pain   . Allergic rhinitis   . Moderate persistent asthma     -FeV1 72% 2011, -IgE 102 2011, CT sinus Neg 2011  . GERD (gastroesophageal reflux disease)   . Hypothyroidism   . Osteoporosis     on reclast yearly  . Hiatal hernia   . Diverticulosis   . Hemorrhoids   . Asthma   . IBS (irritable bowel syndrome)   . Complication of anesthesia     "had hard time waking up from it several times" (02/20/2012)  . Hypercholesteremia   . Pneumonia 04/2011; ~ 11/2011    "double; single" (02/20/2012)  . Exertional dyspnea   . Graves disease   . Headache(784.0)     "related to allergies; more at different times during the year" (02/20/2012)  . Arthritis     "mostly the hands" (02/20/2012)  . Fibromyalgia 11/2011  . Chronic lower back pain   . Depression     "some; don't take anything for it" (02/20/2012)  . Breast cancer 1998    in remission  . Anxiety   . DVT (deep venous thrombosis)      Family History  Problem Relation Age of Onset  . Allergies Mother   . Allergies Father   . Heart disease Mother   . Heart disease Father   . Arthritis Mother   . Arthritis Father   .  Lung cancer Mother   . Colon cancer      Maternal half uncle  . Colon cancer      Maternal half aunt  . Colitis Daughter     Review of Systems Constitutional:   No  weight loss, night sweats,  Fevers, chills,  +fatigue, or  lassitude.  HEENT:   No headaches,  Difficulty swallowing,  Tooth/dental problems, or  Sore throat,                No sneezing, itching, ear ache,  +nasal congestion, post nasal drip,   CV:  No chest pain,  Orthopnea, PND, swelling in lower extremities, anasarca, dizziness, palpitations, syncope.   GI  No heartburn, indigestion, abdominal pain, nausea, vomiting, diarrhea, change in bowel habits, loss of appetite, bloody stools.   Resp:  ,  No coughing up of blood.   No chest wall deformity  Skin: no rash or lesions.  GU: no dysuria, change in color of urine, no urgency or frequency.  No flank pain, no hematuria   MS:  No joint pain or swelling.  No decreased range of motion.  No back pain.  Psych:  No change in  mood or affect. No depression or anxiety.  No memory loss.      Objective:   Physical Exam   BP 116/70  Pulse 70  Temp(Src) 98 F (36.7 C) (Oral)  Ht 5' 1.25" (1.556 m)  Wt 111 lb 6.4 oz (50.531 kg)  BMI 20.87 kg/m2  SpO2 97%  GEN: A/Ox3; pleasant , NAD, well nourished   HEENT:  [place]/AT,  EACs-clear, TMs-wnl, NOSE-clear drainage , THROAT-clear, no lesions, no postnasal drip or exudate noted.   NECK:  Supple w/ fair ROM; no JVD; normal carotid impulses w/o bruits; no thyromegaly or nodules palpated; no lymphadenopathy.  RESP  Clear  P & A; w/o, wheezes/ rales/ or rhonchi.no accessory muscle use, no dullness to percussion  CARD:  RRR, no m/r/g  , no peripheral edema, pulses intact, no cyanosis or clubbing.  GI:   Soft & nt; nml bowel sounds; no organomegaly or masses detected.  Musco: Warm bil, no deformities or joint swelling noted.   Neuro: alert, no focal deficits noted.    Skin: Warm, no lesions or rashes   No results  found.     Assessment & Plan:   Severe persistent asthma with significant atopic features Severe persistent asthma improved with mold abatement in the home Plan No change in inhaled or maintenance medications. Return in  3 months    Updated Medication List Outpatient Encounter Prescriptions as of 12/16/2012  Medication Sig Dispense Refill  . acetaminophen (TYLENOL ARTHRITIS PAIN) 650 MG CR tablet Take 1,300 mg by mouth 2 (two) times daily.        . Alpha-D-Galactosidase (BEANO) TABS Take 1 tablet by mouth daily.       . Alpha-Lipoic Acid 600 MG CAPS Take 600 mg by mouth daily. Once a day      . Alum & Mag Hydroxide-Simeth (MAGIC MOUTHWASH) SOLN Take 5-10 mLs by mouth 3 (three) times daily as needed. For irritation      . Azelastine HCl 0.15 % SOLN Place 1 spray into the nose daily.      . Calcium Citrate-Vitamin D (CITRACAL MAXIMUM) 315-250 MG-UNIT TABS Take 1 tablet by mouth 2 (two) times daily.       . Cholecalciferol (VITAMIN D) 2000 UNITS CAPS Take 0.5 capsules by mouth at bedtime. 1/2 at bedtime      . CREON 24000 UNITS CPEP TAKE 1 CAPSULE BY MOUTH DAILY 3-4 TIMES DAILY(WITH EACH MEAL)  180 each  11  . EPINEPHrine (EPIPEN JR) 0.15 MG/0.3ML injection Inject 0.3 mLs (0.15 mg total) into the muscle as needed. For reaction  1 each  4  . gabapentin (NEURONTIN) 600 MG tablet Take 600 mg by mouth daily as needed.       . lactase (LACTAID) 3000 UNITS tablet Take 3 tablets by mouth as needed. 3-4 tabs with dairy liquids and solids      . levocetirizine (XYZAL) 5 MG tablet Take 5 mg by mouth daily.      Marland Kitchen lidocaine (LIDODERM) 5 % Place 1 patch onto the skin daily as needed. Remove & Discard patch within 12 hours or as directed by MD      . Melatonin 3 MG CAPS Take 9 mg by mouth at bedtime. 3 tabs at bedtime      . mometasone (NASONEX) 50 MCG/ACT nasal spray Place 2 sprays into the nose daily.       . mometasone-formoterol (DULERA) 100-5 MCG/ACT AERO Inhale 1 puff into the lungs 2 (two)  times daily.  1  Inhaler  6  . montelukast (SINGULAIR) 10 MG tablet TAKE 1 TABLET BY MOUTH ONCE DAILY  30 tablet  11  . neomycin-bacitracin-polymyxin (NEOSPORIN) ointment Apply 1 application topically every 12 (twelve) hours. Use on/in nose as directed as needed      . nystatin-triamcinolone (MYCOLOG II) cream Apply 1 application topically 2 (two) times daily as needed. For irritation      . omalizumab (XOLAIR) 150 MG injection Inject 150 mg into the skin every 28 (twenty-eight) days. 150 SQ every 4 weeks      . Polyethyl Glycol-Propyl Glycol (SYSTANE ULTRA) 0.4-0.3 % SOLN Apply 1 drop to eye 2 (two) times daily as needed. 1-2 drops each eye every morning and at bedtime      . Prenatal Vit-Sel-Fe Fum-FA (VINATE M) 27-1 MG TABS Take 1 tablet by mouth daily.      . Probiotic Product (ALIGN) 4 MG CAPS Take 1 tablet by mouth daily. Once a day      . PROCTOZONE-HC 2.5 % rectal cream Apply as needed as directed      . propylthiouracil (PTU) 50 MG tablet take 1/2 tablet (25 mg) twice daily      . pseudoephedrine-guaifenesin (MUCINEX D) 60-600 MG per tablet Take 1 tablet by mouth every 12 (twelve) hours.       . rosuvastatin (CRESTOR) 10 MG tablet Take 1 tablet (10 mg total) by mouth daily.  30 tablet  1  . Simethicone (GAS-X EXTRA STRENGTH) 125 MG CAPS Take 2 each by mouth daily as needed. For gas relief      . Sodium Chloride-Sodium Bicarb (CLASSIC NETI POT SINUS WASH NA) Place into the nose. Daily After AM walks      . [DISCONTINUED] CREON 24000 UNITS CPEP TAKE 1 CAPSULE BY MOUTH DAILY 3-4 TIMES DAILY(WITH EACH MEAL)  180 each  2  . [DISCONTINUED] mometasone-formoterol (DULERA) 100-5 MCG/ACT AERO Inhale 1 puff into the lungs 2 (two) times daily.       . fluconazole (DIFLUCAN) 150 MG tablet Take 150 mg by mouth daily as needed.       . [DISCONTINUED] azithromycin (ZITHROMAX) 250 MG tablet Take 2 tablet x 1 day, then 1 tablet daily for 4 days  6 tablet  1  . [DISCONTINUED] guaiFENesin (MUCINEX) 600 MG 12  hr tablet Take 600 mg by mouth daily at 2 PM daily at 2 PM. PM      . [DISCONTINUED] meclizine (ANTIVERT) 25 MG tablet Take 25 mg by mouth 3 (three) times daily as needed.       No facility-administered encounter medications on file as of 12/16/2012.

## 2012-12-17 ENCOUNTER — Ambulatory Visit
Admission: RE | Admit: 2012-12-17 | Discharge: 2012-12-17 | Disposition: A | Discharge status: Home / Self Care | Payer: Medicare Other | Source: Ambulatory Visit | Attending: Neurology | Admitting: Neurology

## 2012-12-17 DIAGNOSIS — R42 Dizziness and giddiness: Secondary | ICD-10-CM

## 2012-12-17 DIAGNOSIS — H8110 Benign paroxysmal vertigo, unspecified ear: Secondary | ICD-10-CM

## 2012-12-17 LAB — IGE: IgE (Immunoglobulin E), Serum: 206.6 IU/mL — ABNORMAL HIGH (ref 0.0–180.0)

## 2012-12-18 ENCOUNTER — Telehealth: Payer: Self-pay | Admitting: Neurology

## 2012-12-18 NOTE — Assessment & Plan Note (Signed)
Severe persistent asthma improved with mold abatement in the home Plan No change in inhaled or maintenance medications. Return in  3 months

## 2012-12-18 NOTE — Telephone Encounter (Signed)
Thanks for participate in the survey. MRI brain was essentially normal. You were seen by your pulmonologist Dr. Delford Field recently, who can better address your questions of possible pneumonia, Please also check with your primary care Dr. Tanya Nones for your thyroid issues.   Dr. Terrace Arabia   ----- Message -----  From: Ebony Scott  Sent: 12/18/2012 9:54 AM EDT  To: Levert Feinstein, MD  Subject: Questionnaire Submission   Patient Questionnaire Submission  --------------------------------   Questionnaire: Questionnaire   Question: How are you feeling after your recent visit?  Answer: okay, still have back and side pain   Question: Does the recommended course of treatment seem to be helping your symptoms?  Answer: I am also seeming to have some confusion and not able to focus as well as I have in the past., could change in ptu dose cause this???   Question: Are you experiencing any side effects from your recommended treatment?  Answer: no, but worry I have early pneumonia statring   Question: is there anything else you would like to ask your physician?  Answer: should I have xray to rule out pneumonia??

## 2012-12-22 ENCOUNTER — Telehealth: Payer: Self-pay | Admitting: Critical Care Medicine

## 2012-12-22 ENCOUNTER — Telehealth: Payer: Self-pay | Admitting: *Deleted

## 2012-12-23 NOTE — Telephone Encounter (Signed)
Will forward to Chantel to assist with this as I am unaware of any information that [facility] needs.

## 2012-12-23 NOTE — Telephone Encounter (Signed)
LMTRC

## 2012-12-23 NOTE — Telephone Encounter (Signed)
Pt wanted to know if she should be taking any iron or any other supplements due to her hgb being low or her monocytes being elevated?

## 2012-12-25 ENCOUNTER — Telehealth: Payer: Self-pay | Admitting: Critical Care Medicine

## 2012-12-25 ENCOUNTER — Telehealth: Payer: Self-pay | Admitting: Family Medicine

## 2012-12-25 ENCOUNTER — Telehealth: Payer: Self-pay | Admitting: *Deleted

## 2012-12-25 DIAGNOSIS — R0789 Other chest pain: Secondary | ICD-10-CM

## 2012-12-25 MED ORDER — VINATE M 27-1 MG PO TABS: 1.0000 | ORAL_TABLET | Freq: Two times a day (BID) | ORAL | Status: DC

## 2012-12-25 NOTE — Telephone Encounter (Signed)
I spoke with pt. She reports she is still having the pain on left side-goes into back. It is better than from last OV with Dr. Delford Field. She has been taking naproxen, tylenol and using a heating pad. Her PCP did not know what this was related too. Pt is wanting to know if she needs a CXR? Please advise Dr. Delford Field thanks

## 2012-12-25 NOTE — Telephone Encounter (Signed)
The monocyctes are mildly elevated and not related to iron or any medical problem.  Hgb is 11.6 and likely related to chronic disease.  She could try ferous sulfate 325 poqday.

## 2012-12-25 NOTE — Telephone Encounter (Signed)
Ebony Scott - Questionnaire Submission ','[facility] Detail >>          Questionnaire Submission  Doretha Goding  MRN: [id number] DOB: [birth year]     Pt Home: [phone]     Sent: Morey Hummingbird December 25, 2012 12:33 PM   Entered: [phone]                           Current View: Showing all answers Show Only Relevant Answers    Legend: Scores, Non-relevant Questions     Patient Responses     Chl Mychart After Visit Questionnaire    Question 12/25/2012 12:33 PM   How are you feeling after your recent visit? left side still hurting   Does the recommended course of treatment seem to be helping your symptoms? not really   Are you experiencing any side effects from your recommended treatment? not taking any medication   is there anything else you would like to ask your physician? do I need to have an xray since symptom is not going away??         Message    Patient Questionnaire Submission   --------------------------------      Questionnaire: Questionnaire      Question: How are you feeling after your recent visit?   Answer: left side still hurting      Question: Does the recommended course of treatment seem to be helping your symptoms?   Answer: not really      Question: Are you experiencing any side effects from your recommended treatment?   Answer: not taking any medication      Question: is there anything else you would like to ask your physician?   Answer: do I need to have an xray since symptom is not going away??      ----- Message -----   From: Shan Levans, MD   Sent: 12/18/2012 6:30 AM   To: Ebony Scott   Subject: Questionnaire       To ensure we are providing you the highest quality healthcare, we'd like to know how you are feeling after your recent visit. At your earliest convenience, please complete the brief follow-up assessment by clicking the Task: Questionnaire link listed above.      Thank you for your time in helping [place] improve our  services and for partnering with [place] in your wellness and care.      Sincerely,      Your Care Team

## 2012-12-25 NOTE — Telephone Encounter (Signed)
Pt is aware forms have been faxed. 

## 2012-12-25 NOTE — Telephone Encounter (Signed)
Medication refilled per protocol. 

## 2012-12-25 NOTE — Telephone Encounter (Signed)
We received a [facility] fax form from pt.  This has been completed.  Pw has signed it.  I have given this to [place], per our discussion.  She will f/u with [facility] on this.

## 2012-12-25 NOTE — Telephone Encounter (Signed)
error 

## 2012-12-25 NOTE — Telephone Encounter (Signed)
PW is off this afternoon. Please advise Dr. Maple Hudson thanks

## 2012-12-25 NOTE — Telephone Encounter (Signed)
Pt advised and order placed. Emmary Culbreath, CMA  

## 2012-12-25 NOTE — Telephone Encounter (Signed)
Ok to order CXR- result to Dr Delford Field- for Dx atypical Left chest pain

## 2012-12-26 ENCOUNTER — Ambulatory Visit (INDEPENDENT_AMBULATORY_CARE_PROVIDER_SITE_OTHER)
Admission: RE | Admit: 2012-12-26 | Discharge: 2012-12-26 | Disposition: A | Discharge status: Home / Self Care | Payer: Medicare Other | Source: Ambulatory Visit | Attending: Critical Care Medicine | Admitting: Critical Care Medicine

## 2012-12-26 DIAGNOSIS — R0789 Other chest pain: Secondary | ICD-10-CM

## 2012-12-26 NOTE — Telephone Encounter (Signed)
..  Patient aware per vm 

## 2012-12-29 ENCOUNTER — Telehealth: Payer: Self-pay | Admitting: Critical Care Medicine

## 2012-12-29 NOTE — Telephone Encounter (Signed)
Tell her CXR is normal    She needs an OV this week with Tammy parrett

## 2012-12-29 NOTE — Telephone Encounter (Signed)
Pt aware that once PW results the cxr we will contact her. Pt would like Dr Delford Field to know that she is having increased SOB and fatigue. No better since last OV.   Dr Delford Field, please advise on results. Thanks.

## 2012-12-30 ENCOUNTER — Other Ambulatory Visit: Payer: Self-pay | Admitting: Family Medicine

## 2012-12-30 ENCOUNTER — Encounter: Payer: Self-pay | Admitting: Adult Health

## 2012-12-30 ENCOUNTER — Ambulatory Visit (INDEPENDENT_AMBULATORY_CARE_PROVIDER_SITE_OTHER): Payer: Medicare Other | Admitting: Adult Health

## 2012-12-30 VITALS — BP 122/64 | HR 77 | Temp 98.7°F | Ht 61.25 in | Wt 112.0 lb

## 2012-12-30 DIAGNOSIS — J45909 Unspecified asthma, uncomplicated: Secondary | ICD-10-CM

## 2012-12-30 MED ORDER — AZITHROMYCIN 250 MG PO TABS: ORAL_TABLET | ORAL | Status: AC

## 2012-12-30 MED ORDER — AZELASTINE HCL 0.15 % NA SOLN: 2.0000 | Freq: Every day | NASAL | Status: DC

## 2012-12-30 MED ORDER — MOMETASONE FUROATE 50 MCG/ACT NA SUSP: 2.0000 | Freq: Every day | NASAL | Status: DC

## 2012-12-30 NOTE — Telephone Encounter (Signed)
Pt aware of results. appt made for today. Nothing further needed

## 2012-12-30 NOTE — Progress Notes (Signed)
Subjective:    Patient ID: Ebony Scott, female    DOB: [birth year], 62 y.o.   MRN: [id number]  HPI  62 y.o.. WF atopic moderate persistent asthma, elevated IgE. Started Xolair 05/03/10  Initial Pulmonary Consultation 12/23/09   12/30/2012 Acute OV  Complains of prod cough with thick yellow mucus, chest tightness, increased DOE, increased fatigue x6days - denies f/c/s, hemoptysis, nausea, vomiting, edema Mucinex is not helping No chest pain or orthpnea.     Past Medical History  Diagnosis Date  . Chronic neck pain   . Allergic rhinitis   . Moderate persistent asthma     -FeV1 72% 2011, -IgE 102 2011, CT sinus Neg 2011  . GERD (gastroesophageal reflux disease)   . Hypothyroidism   . Osteoporosis     on reclast yearly  . Hiatal hernia   . Diverticulosis   . Hemorrhoids   . Asthma   . IBS (irritable bowel syndrome)   . Complication of anesthesia     "had hard time waking up from it several times" (02/20/2012)  . Hypercholesteremia   . Pneumonia 04/2011; ~ 11/2011    "double; single" (02/20/2012)  . Exertional dyspnea   . Graves disease   . Headache(784.0)     "related to allergies; more at different times during the year" (02/20/2012)  . Arthritis     "mostly the hands" (02/20/2012)  . Fibromyalgia 11/2011  . Chronic lower back pain   . Depression     "some; don't take anything for it" (02/20/2012)  . Breast cancer 1998    in remission  . Anxiety   . DVT (deep venous thrombosis)      Family History  Problem Relation Age of Onset  . Allergies Mother   . Allergies Father   . Heart disease Mother   . Heart disease Father   . Arthritis Mother   . Arthritis Father   . Lung cancer Mother   . Colon cancer      Maternal half uncle  . Colon cancer      Maternal half aunt  . Colitis Daughter     Review of Systems Constitutional:   No  weight loss, night sweats,  Fevers, chills,  +fatigue, or  lassitude.  HEENT:   No headaches,  Difficulty swallowing,  Tooth/dental problems,  or  Sore throat,                No sneezing, itching, ear ache,  +nasal congestion, post nasal drip,   CV:  No chest pain,  Orthopnea, PND, swelling in lower extremities, anasarca, dizziness, palpitations, syncope.   GI  No heartburn, indigestion, abdominal pain, nausea, vomiting, diarrhea, change in bowel habits, loss of appetite, bloody stools.   Resp:  ,  No coughing up of blood.   No chest wall deformity  Skin: no rash or lesions.  GU: no dysuria, change in color of urine, no urgency or frequency.  No flank pain, no hematuria   MS:  No joint pain or swelling.  No decreased range of motion.  No back pain.  Psych:  No change in mood or affect. No depression or anxiety.  No memory loss.      Objective:   Physical Exam   BP 122/64  Pulse 77  Temp(Src) 98.7 F (37.1 C) (Oral)  Ht 5' 1.25" (1.556 m)  Wt 112 lb (50.803 kg)  BMI 20.98 kg/m2  SpO2 96%  GEN: A/Ox3; pleasant , NAD, well nourished   HEENT:  [place]/AT,  EACs-clear, TMs-wnl, NOSE-clear drainage , THROAT-clear, no lesions, no postnasal drip or exudate noted.   NECK:  Supple w/ fair ROM; no JVD; normal carotid impulses w/o bruits; no thyromegaly or nodules palpated; no lymphadenopathy.  RESP  Clear  P & A; w/o, wheezes/ rales/ or rhonchi.no accessory muscle use, no dullness to percussion  CARD:  RRR, no m/r/g  , no peripheral edema, pulses intact, no cyanosis or clubbing.  GI:   Soft & nt; nml bowel sounds; no organomegaly or masses detected.  Musco: Warm bil, no deformities or joint swelling noted.   Neuro: alert, no focal deficits noted.    Skin: Warm, no lesions or rashes   No results found.     Assessment & Plan:   Severe persistent asthma with significant atopic features Mild flare with URI /AR   Plan  Zpack Take as directed.  Mucinex Twice daily  As needed  Congestion  Delsym 2 tsp Twice daily  As needed  Cough  Saline nasal rinses As needed   Please contact office for sooner follow up if  symptoms do not improve or worsen or seek emergency care       Updated Medication List Outpatient Encounter Prescriptions as of 12/30/2012  Medication Sig  . acetaminophen (TYLENOL ARTHRITIS PAIN) 650 MG CR tablet Take 1,300 mg by mouth 2 (two) times daily.    . Alpha-D-Galactosidase (BEANO) TABS Take 1 tablet by mouth daily.   . Alpha-Lipoic Acid 600 MG CAPS Take 600 mg by mouth daily. Once a day  . Alum & Mag Hydroxide-Simeth (MAGIC MOUTHWASH) SOLN Take 5-10 mLs by mouth 3 (three) times daily as needed. For irritation  . Azelastine HCl (ASTEPRO) 0.15 % SOLN Place 2 sprays into the nose daily.  . Azelastine HCl 0.15 % SOLN Place 1 spray into the nose daily.  . Calcium Citrate-Vitamin D (CITRACAL MAXIMUM) 315-250 MG-UNIT TABS Take 1 tablet by mouth 2 (two) times daily.   . Cholecalciferol (VITAMIN D) 2000 UNITS CAPS 1/2 at bedtime  . CREON 24000 UNITS CPEP TAKE 1 CAPSULE BY MOUTH DAILY 3-4 TIMES DAILY(WITH EACH MEAL)  . EPINEPHrine (EPIPEN JR) 0.15 MG/0.3ML injection Inject 0.3 mLs (0.15 mg total) into the muscle as needed. For reaction  . fluconazole (DIFLUCAN) 150 MG tablet Take 150 mg by mouth daily as needed.   . gabapentin (NEURONTIN) 600 MG tablet Take 600 mg by mouth daily as needed.   . lactase (LACTAID) 3000 UNITS tablet Take 3 tablets by mouth as needed. 3-4 tabs with dairy liquids and solids  . levocetirizine (XYZAL) 5 MG tablet Take 5 mg by mouth daily.  Marland Kitchen lidocaine (LIDODERM) 5 % Place 1 patch onto the skin daily as needed. Remove & Discard patch within 12 hours or as directed by MD  . Melatonin 3 MG CAPS Take 9 mg by mouth at bedtime. 3 tabs at bedtime  . mometasone (NASONEX) 50 MCG/ACT nasal spray Place 2 sprays into the nose daily.  . mometasone-formoterol (DULERA) 100-5 MCG/ACT AERO Inhale 1 puff into the lungs 2 (two) times daily.  . montelukast (SINGULAIR) 10 MG tablet TAKE 1 TABLET BY MOUTH ONCE DAILY  . neomycin-bacitracin-polymyxin (NEOSPORIN) ointment Apply 1  application topically every 12 (twelve) hours. Use on/in nose as directed as needed  . nystatin-triamcinolone (MYCOLOG II) cream Apply 1 application topically 2 (two) times daily as needed. For irritation  . omalizumab (XOLAIR) 150 MG injection Inject 150 mg into the skin every 28 (twenty-eight) days. 150 SQ every  4 weeks  . Polyethyl Glycol-Propyl Glycol (SYSTANE ULTRA) 0.4-0.3 % SOLN Apply 1 drop to eye 2 (two) times daily as needed. 1-2 drops each eye every morning and at bedtime  . Prenatal Vit-Sel-Fe Fum-FA (VINATE M) 27-1 MG TABS Take 1 tablet by mouth 2 (two) times daily.  . Probiotic Product (ALIGN) 4 MG CAPS Take 1 tablet by mouth daily. Once a day  . PROCTOZONE-HC 2.5 % rectal cream Apply as needed as directed  . propylthiouracil (PTU) 50 MG tablet take 1/2 tablet (25 mg) twice daily  . pseudoephedrine-guaifenesin (MUCINEX D) 60-600 MG per tablet Take 1 tablet by mouth every 12 (twelve) hours.   . rosuvastatin (CRESTOR) 10 MG tablet Take 1 tablet (10 mg total) by mouth daily.  . Simethicone (GAS-X EXTRA STRENGTH) 125 MG CAPS Take 2 each by mouth daily as needed. For gas relief  . Sodium Chloride-Sodium Bicarb (CLASSIC NETI POT SINUS WASH NA) Place into the nose. Daily After AM walks  . azithromycin (ZITHROMAX Z-PAK) 250 MG tablet Take 2 tablets (500 mg) on  Day 1,  followed by 1 tablet (250 mg) once daily on Days 2 through 5.

## 2012-12-30 NOTE — Telephone Encounter (Signed)
meds refilled 

## 2012-12-30 NOTE — Assessment & Plan Note (Signed)
Mild flare with URI /AR   Plan  Zpack Take as directed.  Mucinex Twice daily  As needed  Congestion  Delsym 2 tsp Twice daily  As needed  Cough  Saline nasal rinses As needed   Please contact office for sooner follow up if symptoms do not improve or worsen or seek emergency care

## 2012-12-30 NOTE — Patient Instructions (Signed)
Zpack Take as directed.  Mucinex Twice daily  As needed  Congestion  Delsym 2 tsp Twice daily  As needed  Cough  Saline nasal rinses As needed   Please contact office for sooner follow up if symptoms do not improve or worsen or seek emergency care    

## 2013-01-01 ENCOUNTER — Ambulatory Visit (INDEPENDENT_AMBULATORY_CARE_PROVIDER_SITE_OTHER): Payer: Medicare Other

## 2013-01-01 DIAGNOSIS — J45909 Unspecified asthma, uncomplicated: Secondary | ICD-10-CM

## 2013-01-02 MED ORDER — OMALIZUMAB 150 MG ~~LOC~~ SOLR
150.0000 mg | Freq: Once | SUBCUTANEOUS | Status: AC
  Administered 2013-01-02: 150 mg via SUBCUTANEOUS

## 2013-01-27 ENCOUNTER — Telehealth: Payer: Self-pay | Admitting: Family Medicine

## 2013-01-27 NOTE — Telephone Encounter (Signed)
Dr Tanya Nones has ordered Prolia injection for patient.  Authorization has gone through and medication is in office.  Patient called to come get injection.  She states coming for lab work on Monday and will get injection then.

## 2013-01-28 ENCOUNTER — Telehealth: Payer: Self-pay | Admitting: *Deleted

## 2013-01-29 ENCOUNTER — Ambulatory Visit: Payer: Medicare Other

## 2013-01-29 ENCOUNTER — Other Ambulatory Visit: Payer: Self-pay | Admitting: Family Medicine

## 2013-01-30 ENCOUNTER — Other Ambulatory Visit: Payer: Self-pay | Admitting: *Deleted

## 2013-01-30 MED ORDER — MAGIC MOUTHWASH: 5.0000 mL | Freq: Three times a day (TID) | ORAL | Status: DC | PRN

## 2013-02-01 ENCOUNTER — Other Ambulatory Visit: Payer: Self-pay | Admitting: Family Medicine

## 2013-02-01 MED ORDER — HYDROCORTISONE 2.5 % RE CREA: TOPICAL_CREAM | RECTAL | Status: DC

## 2013-02-01 NOTE — Telephone Encounter (Signed)
Rx Refilled  

## 2013-02-02 ENCOUNTER — Encounter: Payer: Self-pay | Admitting: Neurology

## 2013-02-02 ENCOUNTER — Ambulatory Visit (INDEPENDENT_AMBULATORY_CARE_PROVIDER_SITE_OTHER): Payer: Medicare Other | Admitting: Neurology

## 2013-02-02 ENCOUNTER — Other Ambulatory Visit: Payer: Medicare Other

## 2013-02-02 ENCOUNTER — Ambulatory Visit (INDEPENDENT_AMBULATORY_CARE_PROVIDER_SITE_OTHER): Payer: Medicare Other

## 2013-02-02 VITALS — BP 122/74 | HR 76 | Ht 62.0 in | Wt 111.0 lb

## 2013-02-02 DIAGNOSIS — E785 Hyperlipidemia, unspecified: Secondary | ICD-10-CM

## 2013-02-02 DIAGNOSIS — Z79899 Other long term (current) drug therapy: Secondary | ICD-10-CM

## 2013-02-02 DIAGNOSIS — R42 Dizziness and giddiness: Secondary | ICD-10-CM

## 2013-02-02 DIAGNOSIS — E039 Hypothyroidism, unspecified: Secondary | ICD-10-CM

## 2013-02-02 DIAGNOSIS — J45909 Unspecified asthma, uncomplicated: Secondary | ICD-10-CM

## 2013-02-02 LAB — HEPATIC FUNCTION PANEL
ALT: 18 U/L (ref 0–35)
AST: 23 U/L (ref 0–37)
Albumin: 4.3 g/dL (ref 3.5–5.2)
Alkaline Phosphatase: 38 U/L — ABNORMAL LOW (ref 39–117)
Bilirubin, Direct: 0.1 mg/dL (ref 0.0–0.3)
Indirect Bilirubin: 0.4 mg/dL (ref 0.0–0.9)
Total Bilirubin: 0.5 mg/dL (ref 0.3–1.2)
Total Protein: 6.7 g/dL (ref 6.0–8.3)

## 2013-02-02 LAB — LIPID PANEL
Cholesterol: 181 mg/dL (ref 0–200)
HDL: 74 mg/dL (ref >39)
LDL Cholesterol: 82 mg/dL (ref 0–99)
Total CHOL/HDL Ratio: 2.4 Ratio
Triglycerides: 126 mg/dL (ref <150)
VLDL: 25 mg/dL (ref 0–40)

## 2013-02-02 LAB — TSH: TSH: 4.374 u[IU]/mL (ref 0.350–4.500)

## 2013-02-02 MED ORDER — OMALIZUMAB 150 MG ~~LOC~~ SOLR
150.0000 mg | Freq: Once | SUBCUTANEOUS | Status: AC
  Administered 2013-02-02: 150 mg via SUBCUTANEOUS

## 2013-02-02 NOTE — Progress Notes (Signed)
[facility]  PATIENT: Ebony Scott DOB: 11/01/1950  HISTORICAL  [name] is a 62 years old right-handed Caucasian female, referred by her primary care physician Dr. Tanya Nones for evaluation of vertigo.  She had a past medical history of asthma, hypothyroidism, history of breast cancer,  She began to have vertigo since September 2014, she got up, leaning towards the left side, had a sudden onset of dizzy spells, nystagmus, improved by lying still, she was given Meclazine, her symptoms have much improved afterwards.  But with sudden positional change, such as turning her head leaning her body forward, turning to the left side,  she still has transient vertigo, she denies hearing loss, no significant gait difficulty.  She had a similar episode about couple years ago, lasting for a few weeks, it was also happened when she was turning towards the left side.  UPDATE 02/02/2013:  She reported total recovery, after vestibular rehab, she had repositional manauver. She no longer has dizziness, we have reviewed MRI films together, there is mild subcortical gliosis, no acute lesions,   REVIEW OF SYSTEMS: Full 14 system review of systems performed and notable only for  fatigue, neck pain, neck stiffness, loss of vision, shortness of breath, cold intolerance, flushing, insomnia, foot energy, joint swelling,  ALLERGIES: Allergies  Allergen Reactions  . Dust Mite Extract Shortness Of Breath and Other (See Comments)    "sneezing" (02/20/2012)  . Molds & Smuts Shortness Of Breath  . Morphine Sulfate Itching  . Other Shortness Of Breath and Other (See Comments)    Grass and weeds "sneezing; filled sinuses" (02/20/2012)  . Penicillins Rash and Other (See Comments)    "welts" (02/20/2012)  . Reclast [Zoledronic Acid]     Put in hospital  . Rofecoxib Swelling    Celebrex REACTION: feet swelling  . Shrimp Flavor Anaphylaxis    ALL SHELLFISH  . Tetracycline Hcl Nausea And Vomiting  .  Dilaudid [Hydromorphone Hcl] Itching  . Hydrocodone-Acetaminophen Nausea And Vomiting  . Levofloxacin Other (See Comments)    REACTION: GI upset  . Oxycodone Hcl Nausea And Vomiting  . Paroxetine Nausea And Vomiting    Paxil   . Soy Allergy Other (See Comments)    Limited soy d/t "ESTROGEN POSITIVE" cancer  . Tree Extract Other (See Comments)    "tested and told I was allergic to it; never experienced a reaction to it" (02/20/2012)    HOME MEDICATIONS: Outpatient Prescriptions Prior to Visit  Medication Sig Dispense Refill  . acetaminophen (TYLENOL ARTHRITIS PAIN) 650 MG CR tablet Take 1,300 mg by mouth 2 (two) times daily.        . Alpha-D-Galactosidase (BEANO) TABS Take 1 tablet by mouth daily.       . Alpha-Lipoic Acid 600 MG CAPS Take 600 mg by mouth daily. Once a day      . Alum & Mag Hydroxide-Simeth (MAGIC MOUTHWASH) SOLN Take 5-10 mLs by mouth 3 (three) times daily as needed. For irritation  240 mL  0  . Azelastine HCl (ASTEPRO) 0.15 % SOLN Place 2 sprays into the nose daily.  30 mL  11  . Calcium Citrate-Vitamin D (CITRACAL MAXIMUM) 315-250 MG-UNIT TABS Take 1 tablet by mouth 2 (two) times daily.       . Cholecalciferol (VITAMIN D) 2000 UNITS CAPS 1/2 at bedtime      . CREON 24000 UNITS CPEP TAKE 1 CAPSULE BY MOUTH DAILY 3-4 TIMES DAILY(WITH EACH MEAL)  180 each  11  . CRESTOR  10 MG tablet TAKE 1 TABLET BY MOUTH DAILY  30 tablet  5  . denosumab (PROLIA) 60 MG/ML SOLN injection Inject 60 mg into the skin every 6 (six) months. Administer in upper arm, thigh, or abdomen      . EPINEPHrine (EPIPEN JR) 0.15 MG/0.3ML injection Inject 0.3 mLs (0.15 mg total) into the muscle as needed. For reaction  1 each  4  . fluconazole (DIFLUCAN) 150 MG tablet Take 150 mg by mouth daily as needed.       . gabapentin (NEURONTIN) 600 MG tablet Take 600 mg by mouth daily as needed.       . hydrocortisone (PROCTOZONE-HC) 2.5 % rectal cream Apply as needed as directed  30 g  5  . lactase (LACTAID) 3000  UNITS tablet Take 3 tablets by mouth as needed. 3-4 tabs with dairy liquids and solids      . levocetirizine (XYZAL) 5 MG tablet TAKE 1 TABLET BY MOUTH ONCE DAILY  30 tablet  11  . lidocaine (LIDODERM) 5 % Place 1 patch onto the skin daily as needed. Remove & Discard patch within 12 hours or as directed by MD      . Melatonin 3 MG CAPS Take 9 mg by mouth at bedtime. 3 tabs at bedtime      . mometasone (NASONEX) 50 MCG/ACT nasal spray Place 2 sprays into the nose daily.  17 g  11  . mometasone-formoterol (DULERA) 100-5 MCG/ACT AERO Inhale 1 puff into the lungs 2 (two) times daily.  1 Inhaler  6  . montelukast (SINGULAIR) 10 MG tablet TAKE 1 TABLET BY MOUTH ONCE DAILY  30 tablet  11  . neomycin-bacitracin-polymyxin (NEOSPORIN) ointment Apply 1 application topically every 12 (twelve) hours. Use on/in nose as directed as needed      . nystatin-triamcinolone ointment (MYCOLOG) APPLY TWICE DAILY UP TO 10 DAYS  30 g  0  . omalizumab (XOLAIR) 150 MG injection Inject 150 mg into the skin every 28 (twenty-eight) days. 150 SQ every 4 weeks      . Polyethyl Glycol-Propyl Glycol (SYSTANE ULTRA) 0.4-0.3 % SOLN Apply 1 drop to eye 2 (two) times daily as needed. 1-2 drops each eye every morning and at bedtime      . Prenatal Vit-Sel-Fe Fum-FA (VINATE M) 27-1 MG TABS Take 1 tablet by mouth 2 (two) times daily.  60 each  11  . Probiotic Product (ALIGN) 4 MG CAPS Take 1 tablet by mouth daily. Once a day      . propylthiouracil (PTU) 50 MG tablet take 1/2 tablet (25 mg) twice daily      . pseudoephedrine-guaifenesin (MUCINEX D) 60-600 MG per tablet Take 1 tablet by mouth every 12 (twelve) hours.       . Simethicone (GAS-X EXTRA STRENGTH) 125 MG CAPS Take 2 each by mouth daily as needed. For gas relief      . Sodium Chloride-Sodium Bicarb (CLASSIC NETI POT SINUS WASH NA) Place into the nose. Daily After AM walks      . Azelastine HCl 0.15 % SOLN Place 1 spray into the nose daily.      Marland Kitchen nystatin-triamcinolone (MYCOLOG  II) cream Apply 1 application topically 2 (two) times daily as needed. For irritation       No facility-administered medications prior to visit.    PAST MEDICAL HISTORY: Past Medical History  Diagnosis Date  . Chronic neck pain   . Allergic rhinitis   . Moderate persistent asthma     -  FeV1 72% 2011, -IgE 102 2011, CT sinus Neg 2011  . GERD (gastroesophageal reflux disease)   . Hypothyroidism   . Osteoporosis     on reclast yearly  . Hiatal hernia   . Diverticulosis   . Hemorrhoids   . Asthma   . IBS (irritable bowel syndrome)   . Complication of anesthesia     "had hard time waking up from it several times" (02/20/2012)  . Hypercholesteremia   . Pneumonia 04/2011; ~ 11/2011    "double; single" (02/20/2012)  . Exertional dyspnea   . Graves disease   . Headache(784.0)     "related to allergies; more at different times during the year" (02/20/2012)  . Arthritis     "mostly the hands" (02/20/2012)  . Fibromyalgia 11/2011  . Chronic lower back pain   . Depression     "some; don't take anything for it" (02/20/2012)  . Breast cancer 1998    in remission  . Anxiety   . DVT (deep venous thrombosis)     PAST SURGICAL HISTORY: Past Surgical History  Procedure Laterality Date  . Cervical fusion  2003    C3-C4  . Cervical discectomy  10/2001    C5-C6  . Vesicovaginal fistula closure w/ tah  1988  . Nasal septum surgery  1980's  . Anterior and posterior repair  1990's  . Breast lumpectomy  1998    left  . Debridement tennis elbow  ?1970's    right  . Knee arthroplasty  ?1990's    "?right; w/cartilage repair" (02/20/2012)  . Carpometacarpel (cmc) fusion of thumb with autograft from radius  ~ 2009    "both thumbs" (02/20/2012)  . Cataract extraction w/ intraocular lens  implant, bilateral  2012  . Tonsillectomy  ~ 1953  . Posterior cervical fusion/foraminotomy  2004    "failed initial fusion; rewired  anterior neck" (02/20/2012)    FAMILY HISTORY: Family History  Problem Relation  Age of Onset  . Allergies Mother   . Heart disease Mother   . Arthritis Mother   . Lung cancer Mother   . Allergies Father   . Heart disease Father   . Arthritis Father   . Colon cancer      Maternal half aunt/Maternal half uncle  . Colitis Daughter     SOCIAL HISTORY:  History   Social History  . Marital Status: Divorced    Spouse Name: N/A    Number of Children: 2  . Years of Education: college   Occupational History  . Disabled     Retired Audiological scientist  .     Social History Main Topics  . Smoking status: Never Smoker   . Smokeless tobacco: Never Used  . Alcohol Use: 0.0 oz/week     Comment: 02/20/2012 "couple glasses of wine/6 months"  . Drug Use: No  . Sexual Activity: No   Other Topics Concern  . Not on file   Social History Narrative   Patient lives at home alone. Patient  divorced.    Patient has her BS degree.   Right handed.   Caffeine- sometimes coffee.              PHYSICAL EXAM   Filed Vitals:   02/02/13 1045  BP: 122/74  Pulse: 76  Height: 5\' 2"  (1.575 m)  Weight: 111 lb (50.349 kg)   Body mass index is 20.3 kg/(m^2).   Generalized: In no acute distress  Neck: Supple, no carotid bruits  Cardiac: Regular rate rhythm  Pulmonary: Clear to auscultation bilaterally  Musculoskeletal: No deformity  Neurological examination  Mentation: Alert oriented to time, place, history taking, and causual conversation  Cranial nerve II-XII: Pupils were equal round reactive to light extraocular movements were full, visual field were full on confrontational test. facial sensation and strength were normal. hearing was intact to finger rubbing bilaterally. Uvula tongue midline.  head turning and shoulder shrug and were normal and symmetric.Tongue protrusion into cheek strength was normal.  Motor: normal tone, bulk and strength.  Sensory: Intact to fine touch, pinprick, preserved vibratory sensation, and proprioception at  toes.  Coordination: Normal finger to nose, heel-to-shin bilaterally there was no truncal ataxia  Gait: Rising up from seated position without assistance, normal stance, without trunk ataxia, moderate stride, good arm swing, smooth turning, able to perform tiptoe, and heel walking without difficulty.   Romberg signs: Negative  Deep tendon reflexes: Brachioradialis 2/2, biceps 2/2, triceps 2/2, patellar 2/2, Achilles 2/2, plantar responses were flexor bilaterally.   DIAGNOSTIC DATA (LABS, IMAGING, TESTING) - I reviewed patient records, labs, notes, testing and imaging myself where available.  Lab Results  Component Value Date   WBC 5.5 12/08/2012   HGB 11.6* 12/08/2012   HCT 34.3* 12/08/2012   MCV 96.9 12/08/2012   PLT 232 12/08/2012      Component Value Date/Time   NA 141 12/08/2012 0950   K 4.4 12/08/2012 0950   CL 106 12/08/2012 0950   CO2 28 12/08/2012 0950   GLUCOSE 85 12/08/2012 0950   BUN 16 12/08/2012 0950   CREATININE 1.03 12/08/2012 0950   CREATININE 0.93 02/25/2012 0458   CALCIUM 9.9 12/08/2012 0950   PROT 6.8 12/08/2012 0950   ALBUMIN 4.3 12/08/2012 0950   AST 29 12/08/2012 0950   ALT 24 12/08/2012 0950   ALKPHOS 39 12/08/2012 0950   BILITOT 0.6 12/08/2012 0950   GFRNONAA 64* 02/25/2012 0458   GFRAA 74* 02/25/2012 0458   Lab Results  Component Value Date   CHOL 272* 12/08/2012   HDL 73 12/08/2012   LDLCALC 158* 12/08/2012   TRIG 205* 12/08/2012   CHOLHDL 3.7 12/08/2012   No results found for this basename: HGBA1C   Lab Results  Component Value Date   VITAMINB12 648 02/22/2012   Lab Results  Component Value Date   TSH 6.839* 12/08/2012      ASSESSMENT AND PLAN   62 years old Caucasian female, with acute onset of vertigo, triggered by turning her head to the left side, she also complains of dizzines with sudden positional change, I was able to induce vertigo, with repositional maneuver, this is most consistent with benign positional vertigo. her  symptoms has much improved with repositioning maneuver,    we have reviewed MRI films together, there is mild subcortical gliosis, no acute lesions,  Will only return to clinic if new issues arise,     Levert Feinstein, M.D. Ph.D.  [facility] [address], [address] [place], [place] [address] [phone]

## 2013-02-04 ENCOUNTER — Encounter: Payer: Self-pay | Admitting: Family Medicine

## 2013-02-17 ENCOUNTER — Telehealth: Payer: Self-pay | Admitting: Family Medicine

## 2013-02-17 ENCOUNTER — Other Ambulatory Visit: Payer: Self-pay | Admitting: Family Medicine

## 2013-02-17 MED ORDER — MAGIC MOUTHWASH: 5.0000 mL | Freq: Three times a day (TID) | ORAL | Status: DC | PRN

## 2013-02-17 MED ORDER — GABAPENTIN 600 MG PO TABS: 600.0000 mg | ORAL_TABLET | Freq: Every day | ORAL | Status: DC | PRN

## 2013-02-17 NOTE — Telephone Encounter (Signed)
Medication refilled per protocol. 

## 2013-02-24 ENCOUNTER — Ambulatory Visit: Payer: Medicare Other | Admitting: *Deleted

## 2013-02-24 DIAGNOSIS — M810 Age-related osteoporosis without current pathological fracture: Secondary | ICD-10-CM

## 2013-02-27 ENCOUNTER — Telehealth: Payer: Self-pay | Admitting: Critical Care Medicine

## 2013-02-27 NOTE — Telephone Encounter (Signed)
According tot he pt records she had pneumonia vaccine in 11-2011. I advised the notice happened because she just turned 41 yesterday and that the recs are to have a booster after 63. I advised the pt of the new pna vaccine that is available now and advised that she should wait until OV on 04-09-13 and discuss all of this with him at that time. I advised the nurse can give her any vaccines she needs at that time. Wales Bing, CMA

## 2013-03-02 ENCOUNTER — Ambulatory Visit (INDEPENDENT_AMBULATORY_CARE_PROVIDER_SITE_OTHER): Payer: Medicare Other

## 2013-03-02 DIAGNOSIS — J45909 Unspecified asthma, uncomplicated: Secondary | ICD-10-CM

## 2013-03-04 ENCOUNTER — Telehealth: Payer: Self-pay | Admitting: *Deleted

## 2013-03-05 ENCOUNTER — Telehealth: Payer: Self-pay | Admitting: Critical Care Medicine

## 2013-03-05 MED ORDER — VINATE M 27-1 MG PO TABS: 1.0000 | ORAL_TABLET | Freq: Two times a day (BID) | ORAL | Status: DC

## 2013-03-05 MED ORDER — HYDROCORTISONE 2.5 % RE CREA: TOPICAL_CREAM | RECTAL | Status: DC

## 2013-03-05 MED ORDER — BUDESONIDE-FORMOTEROL FUMARATE 160-4.5 MCG/ACT IN AERO: 2.0000 | INHALATION_SPRAY | Freq: Two times a day (BID) | RESPIRATORY_TRACT | Status: DC

## 2013-03-05 MED ORDER — MOMETASONE FURO-FORMOTEROL FUM 100-5 MCG/ACT IN AERO: 1.0000 | INHALATION_SPRAY | Freq: Two times a day (BID) | RESPIRATORY_TRACT | Status: DC

## 2013-03-05 NOTE — Telephone Encounter (Signed)
Try symbicort 160 two puff bid

## 2013-03-05 NOTE — Telephone Encounter (Signed)
Error in escribe.  Rx's called in

## 2013-03-05 NOTE — Telephone Encounter (Signed)
Called [facility] and they stated that the [place] just states it is not covered.  i have called the pts insurance company and have been changed to 4 different people and have been on the phone for almost 30 minutes.  They stated that the medication is not covered ---PW please advise if we can change to another inhaler.  Thanks  Allergies  Allergen Reactions  . Dust Mite Extract Shortness Of Breath and Other (See Comments)    "sneezing" (02/20/2012)  . Molds & Smuts Shortness Of Breath  . Morphine Sulfate Itching  . Other Shortness Of Breath and Other (See Comments)    Grass and weeds "sneezing; filled sinuses" (02/20/2012)  . Penicillins Rash and Other (See Comments)    "welts" (02/20/2012)  . Reclast [Zoledronic Acid]     Put in hospital  . Rofecoxib Swelling    Celebrex REACTION: feet swelling  . Shrimp Flavor Anaphylaxis    ALL SHELLFISH  . Tetracycline Hcl Nausea And Vomiting  . Dilaudid [Hydromorphone Hcl] Itching  . Hydrocodone-Acetaminophen Nausea And Vomiting  . Levofloxacin Other (See Comments)    REACTION: GI upset  . Oxycodone Hcl Nausea And Vomiting  . Paroxetine Nausea And Vomiting    Paxil   . Soy Allergy Other (See Comments)    Limited soy d/t "ESTROGEN POSITIVE" cancer  . Tree Extract Other (See Comments)    "tested and told I was allergic to it; never experienced a reaction to it" (02/20/2012)

## 2013-03-05 NOTE — Telephone Encounter (Signed)
Rx Refilled  

## 2013-03-05 NOTE — Telephone Encounter (Signed)
I called the pt to let her know that her insurance will not cover [facility] and that we will need to change her inhaler. She argued with me that she herself had called her insurance company and they told her that if we completed a PA, Dulera would be covered.   I spoke with Marliss Czar, who called the pt's insurance company this morning and she states that her insurance company would not even do a PA, THEY WILL NOT COVER DULERA.  I advised the pt again, that Ruthe Mannan will not be covered by her insurance plan. She continued to argue that she talked with her insurance company and they told her that they would. I explained to the pt that Leigh spent 30 minutes and spoke to 5 different people and they would not let her even initiate a PA. Pt then stated that Symbicort didn't help her the last time she was on it, what will we do if it doesn't work this time? Advised her that we will discuss that with PW when the time comes.  Symbicort will be sent to her pharmacy.

## 2013-03-06 ENCOUNTER — Telehealth: Payer: Self-pay | Admitting: Critical Care Medicine

## 2013-03-06 MED ORDER — OMALIZUMAB 150 MG ~~LOC~~ SOLR
150.0000 mg | Freq: Once | SUBCUTANEOUS | Status: AC
  Administered 2013-03-06: 150 mg via SUBCUTANEOUS

## 2013-03-06 NOTE — Telephone Encounter (Signed)
Spoke with pt. She reports she spoke with her insurance this AM and was advised by [place] in customer service that [place] just needs a PA. I advised her a nurse had already called yesterday regarding the PA 1/151/5. She reports I need to call [phone] and speak with someone bc she was told a different story. She reports she has already tried symbicort and it did not work for her.   ATC # and was on hold x 15 min and never reached anyone wcb

## 2013-03-09 NOTE — Telephone Encounter (Signed)
I called and spoke with Ebony Scott at [phone]; he states that the patient has a benefit max of 13 doses of Dulera in a 24 day period;the only way to get this changed is to call [employer] customer service directly. I have the number from patients insurance card in [facility] and will call later this afternoon to get this worked out if possible.

## 2013-03-10 NOTE — Telephone Encounter (Signed)
Calling to check on the status of prior authorization for dulera.

## 2013-03-10 NOTE — Telephone Encounter (Signed)
After holding for 16 min. I spoke with Lattie Haw from [facility]. I was advised we needed to speak with [facility] pharmacy benefits department to get an override on this. That # is [phone].  I spoke with Jasmine from [employer]. She reports this medication does not need an override. She is showing it needs a PA.  I was then transferred to [place] a PA rep. This medication was approved from 03/10/13-03/10/14. Case #: [id number].  Called pt and LMTCB x1 to make her aware of approval.

## 2013-03-10 NOTE — Telephone Encounter (Signed)
Called customer service # on card [phone] after getting through prompts I was on hold x 15 minutes, no answer. I called and advised the pt that we are working on this and asked if she needs samples. She states she has several days of medication so she will wait. I advised we will let her know once we get this issue straight. WCB. [place] Bing, CMA

## 2013-03-12 ENCOUNTER — Encounter: Payer: Self-pay | Admitting: Internal Medicine

## 2013-03-12 NOTE — Telephone Encounter (Signed)
Thank you :)

## 2013-03-13 NOTE — Telephone Encounter (Signed)
Spoke with pt and notified of approval  Nothing further needed

## 2013-03-19 ENCOUNTER — Encounter: Payer: Self-pay | Admitting: Critical Care Medicine

## 2013-03-19 NOTE — Telephone Encounter (Signed)
Dr. Joya Gaskins, in patient chart it is documented pt was giving PNA vaccine 11/28/2011. Please advise thanks

## 2013-04-01 ENCOUNTER — Encounter: Payer: Self-pay | Admitting: Family Medicine

## 2013-04-01 ENCOUNTER — Ambulatory Visit (INDEPENDENT_AMBULATORY_CARE_PROVIDER_SITE_OTHER): Payer: Medicare Other | Admitting: Family Medicine

## 2013-04-01 VITALS — BP 120/62 | HR 76 | Temp 98.3°F | Resp 16 | Ht 62.0 in | Wt 112.0 lb

## 2013-04-01 DIAGNOSIS — J019 Acute sinusitis, unspecified: Secondary | ICD-10-CM

## 2013-04-01 MED ORDER — PREDNISONE 20 MG PO TABS: ORAL_TABLET | ORAL | Status: DC

## 2013-04-01 MED ORDER — AZITHROMYCIN 250 MG PO TABS: ORAL_TABLET | ORAL | Status: DC

## 2013-04-01 MED ORDER — PSEUDOEPHEDRINE-GUAIFENESIN ER 60-600 MG PO TB12: 1.0000 | ORAL_TABLET | Freq: Two times a day (BID) | ORAL | Status: DC

## 2013-04-01 NOTE — Progress Notes (Signed)
Subjective:    Patient ID: Ebony Scott, female    DOB: [birth year], 63 y.o.   MRN: [id number]  HPI  Last 5-6 days the patient has had pressure and pain in her left greater than right maxillary sinus, rhinorrhea and congestion, postnasal drip, and cough productive of yellow sputum. The intensity of the cough is worsening. The cough itself as well as the. The patient reports subjective fevers and chills. She reports headaches. She denies any nausea vomiting or diarrhea. She denies any sore throat with nausea. She denies any flulike muscle aches.  She is wheezing slightly on exam. Past Medical History  Diagnosis Date  . Chronic neck pain   . Allergic rhinitis   . Moderate persistent asthma     -FeV1 72% 2011, -IgE 102 2011, CT sinus Neg 2011  . GERD (gastroesophageal reflux disease)   . Hypothyroidism   . Osteoporosis     on reclast yearly  . Hiatal hernia   . Diverticulosis   . Hemorrhoids   . Asthma   . IBS (irritable bowel syndrome)   . Complication of anesthesia     "had hard time waking up from it several times" (02/20/2012)  . Hypercholesteremia   . Pneumonia 04/2011; ~ 11/2011    "double; single" (02/20/2012)  . Exertional dyspnea   . Graves disease   . Headache(784.0)     "related to allergies; more at different times during the year" (02/20/2012)  . Arthritis     "mostly the hands" (02/20/2012)  . Fibromyalgia 11/2011  . Chronic lower back pain   . Depression     "some; don't take anything for it" (02/20/2012)  . Breast cancer 1998    in remission  . Anxiety   . DVT (deep venous thrombosis)    Current Outpatient Prescriptions on File Prior to Visit  Medication Sig Dispense Refill  . acetaminophen (TYLENOL ARTHRITIS PAIN) 650 MG CR tablet Take 1,300 mg by mouth 2 (two) times daily.        . Alpha-D-Galactosidase (BEANO) TABS Take 1 tablet by mouth daily.       . Alpha-Lipoic Acid 600 MG CAPS Take 600 mg by mouth daily. Once a day      . Alum & Mag Hydroxide-Simeth (MAGIC  MOUTHWASH) SOLN Take 5-10 mLs by mouth 3 (three) times daily as needed. For irritation  240 mL  0  . Azelastine HCl (ASTEPRO) 0.15 % SOLN Place 2 sprays into the nose daily.  30 mL  11  . budesonide-formoterol (SYMBICORT) 160-4.5 MCG/ACT inhaler Inhale 2 puffs into the lungs 2 (two) times daily.  1 Inhaler  6  . Calcium Citrate-Vitamin D (CITRACAL MAXIMUM) 315-250 MG-UNIT TABS Take 1 tablet by mouth 2 (two) times daily.       . Cholecalciferol (VITAMIN D) 2000 UNITS CAPS 1/2 at bedtime      . CREON 24000 UNITS CPEP TAKE 1 CAPSULE BY MOUTH DAILY 3-4 TIMES DAILY(WITH EACH MEAL)  180 each  11  . CRESTOR 10 MG tablet TAKE 1 TABLET BY MOUTH DAILY  30 tablet  5  . denosumab (PROLIA) 60 MG/ML SOLN injection Inject 60 mg into the skin every 6 (six) months. Administer in upper arm, thigh, or abdomen      . EPINEPHrine (EPIPEN JR) 0.15 MG/0.3ML injection Inject 0.3 mLs (0.15 mg total) into the muscle as needed. For reaction  1 each  4  . fluconazole (DIFLUCAN) 150 MG tablet Take 150 mg by mouth daily as needed.       Marland Kitchen  gabapentin (NEURONTIN) 600 MG tablet Take 1 tablet (600 mg total) by mouth daily as needed.  90 tablet  0  . hydrocortisone (PROCTOZONE-HC) 2.5 % rectal cream Apply as needed as directed  30 g  5  . lactase (LACTAID) 3000 UNITS tablet Take 3 tablets by mouth as needed. 3-4 tabs with dairy liquids and solids      . levocetirizine (XYZAL) 5 MG tablet TAKE 1 TABLET BY MOUTH ONCE DAILY  30 tablet  11  . lidocaine (LIDODERM) 5 % Place 1 patch onto the skin daily as needed. Remove & Discard patch within 12 hours or as directed by MD      . Melatonin 3 MG CAPS Take 9 mg by mouth at bedtime. 3 tabs at bedtime      . mometasone (NASONEX) 50 MCG/ACT nasal spray Place 2 sprays into the nose daily.  17 g  11  . mometasone-formoterol (DULERA) 100-5 MCG/ACT AERO Inhale 1 puff into the lungs 2 (two) times daily.  1 Inhaler  6  . montelukast (SINGULAIR) 10 MG tablet TAKE 1 TABLET BY MOUTH ONCE DAILY  30  tablet  11  . neomycin-bacitracin-polymyxin (NEOSPORIN) ointment Apply 1 application topically every 12 (twelve) hours. Use on/in nose as directed as needed      . nystatin-triamcinolone ointment (MYCOLOG) APPLY TWICE DAILY UP TO 10 DAYS  30 g  0  . omalizumab (XOLAIR) 150 MG injection Inject 150 mg into the skin every 28 (twenty-eight) days. 150 SQ every 4 weeks      . Polyethyl Glycol-Propyl Glycol (SYSTANE ULTRA) 0.4-0.3 % SOLN Apply 1 drop to eye 2 (two) times daily as needed. 1-2 drops each eye every morning and at bedtime      . Prenatal Vit-Sel-Fe Fum-FA (VINATE M) 27-1 MG TABS Take 1 tablet by mouth 2 (two) times daily.  60 each  11  . Probiotic Product (ALIGN) 4 MG CAPS Take 1 tablet by mouth daily. Once a day      . propylthiouracil (PTU) 50 MG tablet take 1/2 tablet (25 mg) twice daily      . pseudoephedrine-guaifenesin (MUCINEX D) 60-600 MG per tablet Take 1 tablet by mouth every 12 (twelve) hours.       . Simethicone (GAS-X EXTRA STRENGTH) 125 MG CAPS Take 2 each by mouth daily as needed. For gas relief      . Sodium Chloride-Sodium Bicarb (CLASSIC NETI POT SINUS WASH NA) Place into the nose. Daily After AM walks      . [DISCONTINUED] methimazole (TAPAZOLE) 5 MG tablet Take 5 mg by mouth 3 (three) times daily.         No current facility-administered medications on file prior to visit.   Allergies  Allergen Reactions  . Dust Mite Extract Shortness Of Breath and Other (See Comments)    "sneezing" (02/20/2012)  . Molds & Smuts Shortness Of Breath  . Morphine Sulfate Itching  . Other Shortness Of Breath and Other (See Comments)    Grass and weeds "sneezing; filled sinuses" (02/20/2012)  . Penicillins Rash and Other (See Comments)    "welts" (02/20/2012)  . Reclast [Zoledronic Acid]     Put in hospital  . Rofecoxib Swelling    Celebrex REACTION: feet swelling  . Shrimp Flavor Anaphylaxis    ALL SHELLFISH  . Tetracycline Hcl Nausea And Vomiting  . Dilaudid [Hydromorphone Hcl]  Itching  . Hydrocodone-Acetaminophen Nausea And Vomiting  . Levofloxacin Other (See Comments)    REACTION: GI upset  .  Oxycodone Hcl Nausea And Vomiting  . Paroxetine Nausea And Vomiting    Paxil   . Soy Allergy Other (See Comments)    Limited soy d/t "ESTROGEN POSITIVE" cancer  . Tree Extract Other (See Comments)    "tested and told I was allergic to it; never experienced a reaction to it" (02/20/2012)   History   Social History  . Marital Status: Divorced    Spouse Name: N/A    Number of Children: 2  . Years of Education: college   Occupational History  . Disabled     Retired Print production planner  .     Social History Main Topics  . Smoking status: Never Smoker   . Smokeless tobacco: Never Used  . Alcohol Use: 0.0 oz/week     Comment: 02/20/2012 "couple glasses of wine/6 months"  . Drug Use: No  . Sexual Activity: No   Other Topics Concern  . Not on file   Social History Narrative   Patient lives at home alone. Patient  divorced.    Patient has her BS degree.   Right handed.   Caffeine- sometimes coffee.              Review of Systems  All other systems reviewed and are negative.       Objective:   Physical Exam  Vitals reviewed. HENT:  Right Ear: Tympanic membrane, external ear and ear canal normal.  Left Ear: Tympanic membrane, external ear and ear canal normal.  Nose: Mucosal edema and rhinorrhea present. Right sinus exhibits maxillary sinus tenderness and frontal sinus tenderness. Left sinus exhibits maxillary sinus tenderness.  Mouth/Throat: Oropharynx is clear and moist. No oropharyngeal exudate.  Neck: Neck supple. No thyromegaly present.  Cardiovascular: Normal rate, regular rhythm and normal heart sounds.   No murmur heard. Pulmonary/Chest: Effort normal. No respiratory distress. She has wheezes. She has no rales. She exhibits no tenderness.  Lymphadenopathy:    She has no cervical adenopathy.          Assessment & Plan:  1.  Acute rhinosinusitis I gave the patient is a Z pack. Also recommended Mucinex D 1 tablet by mouth twice a day. I recommended nasal saline 4 times a day. I gave the patient a prescription for prednisone and instructed her to fill this prescription only if her wheezing worsens. However, she will to avoid filling that prescription unless necessary. - azithromycin (ZITHROMAX) 250 MG tablet; 2 tabs poqday1, 1 tab poqday 2-5  Dispense: 6 tablet; Refill: 0 - predniSONE (DELTASONE) 20 MG tablet; 3 tabs poqday 1-2, 2 tabs poqday 3-4, 1 tab poqday 5-6  Dispense: 12 tablet; Refill: 0 - pseudoephedrine-guaifenesin (MUCINEX D) 60-600 MG per tablet; Take 1 tablet by mouth every 12 (twelve) hours.  Dispense: 60 tablet; Refill: 1

## 2013-04-06 ENCOUNTER — Telehealth: Payer: Self-pay | Admitting: Family Medicine

## 2013-04-06 MED ORDER — AZITHROMYCIN 250 MG PO TABS: ORAL_TABLET | ORAL | Status: DC

## 2013-04-06 NOTE — Telephone Encounter (Signed)
She can have another z-pack.  Did she take prednisone, I would recommend that she does.

## 2013-04-06 NOTE — Telephone Encounter (Signed)
Med sent to pharm and pt aware per vm

## 2013-04-06 NOTE — Telephone Encounter (Signed)
Call back number is [phone] Pharmacy [facility] PT is needing another round of the anitbotics (alot of allergies, not feeling better from last week,finished zpac on Sunday)

## 2013-04-08 ENCOUNTER — Encounter: Payer: Self-pay | Admitting: Family Medicine

## 2013-04-09 ENCOUNTER — Ambulatory Visit: Payer: Medicare Other | Admitting: Critical Care Medicine

## 2013-04-09 ENCOUNTER — Ambulatory Visit: Payer: Medicare Other

## 2013-04-13 ENCOUNTER — Telehealth: Payer: Self-pay | Admitting: Critical Care Medicine

## 2013-04-13 ENCOUNTER — Other Ambulatory Visit: Payer: Self-pay | Admitting: Family Medicine

## 2013-04-13 ENCOUNTER — Telehealth: Payer: Self-pay | Admitting: Family Medicine

## 2013-04-13 MED ORDER — FLUCONAZOLE 150 MG PO TABS: 150.0000 mg | ORAL_TABLET | Freq: Once | ORAL | Status: DC

## 2013-04-13 NOTE — Telephone Encounter (Signed)
I e-scribed diflucan.

## 2013-04-13 NOTE — Telephone Encounter (Signed)
Called and spoke w/ pt. She is due for xolair on Thursday. She has a yeast infection and will start on diflucan. She may come in sooner to get this not sure yet. Depends on if she is up to it. FYI for [place].

## 2013-04-13 NOTE — Telephone Encounter (Signed)
Has completed both rounds of ABX.  Now has terrible yeast infection.  Needs Diflucan.  Can she have more than #1

## 2013-04-13 NOTE — Telephone Encounter (Signed)
Left patient message that Rx sent by provider

## 2013-04-14 ENCOUNTER — Other Ambulatory Visit: Payer: Self-pay | Admitting: Family Medicine

## 2013-04-14 MED ORDER — MAGIC MOUTHWASH: 5.0000 mL | Freq: Three times a day (TID) | ORAL | Status: DC | PRN

## 2013-04-14 NOTE — Telephone Encounter (Signed)
Rx Refilled  

## 2013-04-15 ENCOUNTER — Ambulatory Visit (INDEPENDENT_AMBULATORY_CARE_PROVIDER_SITE_OTHER): Payer: Medicare Other

## 2013-04-15 DIAGNOSIS — J45909 Unspecified asthma, uncomplicated: Secondary | ICD-10-CM

## 2013-04-16 ENCOUNTER — Ambulatory Visit: Payer: Medicare Other

## 2013-04-17 MED ORDER — OMALIZUMAB 150 MG ~~LOC~~ SOLR
150.0000 mg | Freq: Once | SUBCUTANEOUS | Status: AC
  Administered 2013-04-17: 150 mg via SUBCUTANEOUS

## 2013-04-20 ENCOUNTER — Encounter: Payer: Self-pay | Admitting: Family Medicine

## 2013-04-20 ENCOUNTER — Ambulatory Visit (INDEPENDENT_AMBULATORY_CARE_PROVIDER_SITE_OTHER): Payer: Medicare Other | Admitting: Family Medicine

## 2013-04-20 ENCOUNTER — Ambulatory Visit: Payer: Medicare Other | Admitting: Critical Care Medicine

## 2013-04-20 ENCOUNTER — Telehealth: Payer: Self-pay | Admitting: Family Medicine

## 2013-04-20 VITALS — BP 118/58 | HR 82 | Temp 98.1°F | Resp 22 | Ht 60.5 in | Wt 111.0 lb

## 2013-04-20 DIAGNOSIS — J45909 Unspecified asthma, uncomplicated: Secondary | ICD-10-CM

## 2013-04-20 DIAGNOSIS — H60399 Other infective otitis externa, unspecified ear: Secondary | ICD-10-CM

## 2013-04-20 DIAGNOSIS — H6092 Unspecified otitis externa, left ear: Secondary | ICD-10-CM

## 2013-04-20 MED ORDER — NEOMYCIN-POLYMYXIN-HC 3.5-10000-1 OT SOLN: 3.0000 [drp] | Freq: Four times a day (QID) | OTIC | Status: DC

## 2013-04-20 MED ORDER — FLUCONAZOLE 150 MG PO TABS: ORAL_TABLET | ORAL | Status: DC

## 2013-04-20 MED ORDER — ANTIPYRINE-BENZOCAINE 5.4-1.4 % OT SOLN: 3.0000 [drp] | OTIC | Status: DC | PRN

## 2013-04-20 NOTE — Patient Instructions (Signed)
Use ear drops as prescribed for 1 week  Continue the mucinex D  Call if ear not better- [facility] Nose [facility] Throat  F/U with Dr. Caren Griffins as needed

## 2013-04-20 NOTE — Telephone Encounter (Signed)
Called pt back and states that her ear estatchian tube is inflammed and irrated and sore and goes down to her throat, feels worse at night, has been dealing with for several weeks. Having SOB, pt has appt today to see Dr. Buelah Manis.

## 2013-04-20 NOTE — Progress Notes (Signed)
Patient ID: Ebony Scott, female   DOB: 1950-03-20, 63 y.o.   MRN: [id number]     Subjective:    Patient ID: Ebony Scott, female    DOB: 08-03-1950, 63 y.o.   MRN: [id number]  Patient presents for Illness  Patient here with left ear pain for the past 3 weeks. She has a history of chronic recurrent sinusitis and always has difficulty with sinus drainage and pressure. She was treated for sinusitis back in February with 2 rounds of Z-Pak as she recently completed prednisone about a week ago. She's not had any discharge from her ear but states that the pain has been worsening and feels muffled on that side. She did use some sweet oil in the ear which helped a little. She's not had any fever associated. She also has a history of asthma with allergic rhinitis and atopy. She's had some mild wheezing but otherwise no cough or shortness of breath.   Review Of Systems: per above  GEN- denies fatigue, fever, weight loss,weakness, recent illness HEENT- denies eye drainage, change in vision, nasal discharge, CVS- denies chest pain, palpitations RESP- denies SOB, cough, +wheeze Neuro- denies headache, dizziness, syncope, seizure activity       Objective:    BP 118/58  Pulse 82  Temp(Src) 98.1 F (36.7 C)  Resp 22  Ht 5' 0.5" (1.537 m)  Wt 111 lb (50.349 kg)  BMI 21.31 kg/m2  SpO2 96% GEN- NAD, alert and oriented x3 HEENT- PERRL, EOMI, non injected sclera, pink conjunctiva, MMM, oropharynx clear, TM clear bilat no effusion, Left ear canal mild erythema small amount of  white discharge + mild maxillary sinus tenderness,nares clear Neck- Supple, no LAD CVS- RRR, no murmur RESP-CTAB EXT- No edema Pulses- Radial 2+          Assessment & Plan:      Problem List Items Addressed This Visit   Severe persistent asthma with significant atopic features     I see no evidence of exacerbation and she is status post a course of prednisone as well as azithromycin. She can continue her routine  inhaler     Other Visit Diagnoses   Left otitis externa    -  Primary    She does have some mild erythema and discharge in the left ear. I do not see any otitis media. Ear drops given, refer ENT if not better    Relevant Medications       neomycin-polymyxin-hydrocortisone (CORTISPORIN) otic solution       antipyrine-benzocaine (AURALGAN) otic solution       Note: This dictation was prepared with Dragon dictation along with smaller phrase technology. Any transcriptional errors that result from this process are unintentional.

## 2013-04-20 NOTE — Assessment & Plan Note (Signed)
I see no evidence of exacerbation and she is status post a course of prednisone as well as azithromycin. She can continue her routine inhaler

## 2013-04-20 NOTE — Telephone Encounter (Signed)
Call back number is [phone] Pharmacy [facility] Pt left ear is bothering her more than normal and she seems to think that the gland is still swollen

## 2013-04-27 ENCOUNTER — Encounter: Payer: Self-pay | Admitting: Critical Care Medicine

## 2013-04-27 ENCOUNTER — Ambulatory Visit (INDEPENDENT_AMBULATORY_CARE_PROVIDER_SITE_OTHER): Payer: Medicare Other | Admitting: Critical Care Medicine

## 2013-04-27 ENCOUNTER — Telehealth: Payer: Self-pay | Admitting: Family Medicine

## 2013-04-27 ENCOUNTER — Ambulatory Visit (INDEPENDENT_AMBULATORY_CARE_PROVIDER_SITE_OTHER)
Admission: RE | Admit: 2013-04-27 | Discharge: 2013-04-27 | Disposition: A | Discharge status: Home / Self Care | Payer: Medicare Other | Source: Ambulatory Visit | Attending: Critical Care Medicine | Admitting: Critical Care Medicine

## 2013-04-27 VITALS — BP 118/80 | HR 79 | Temp 97.5°F | Ht 61.75 in | Wt 112.2 lb

## 2013-04-27 DIAGNOSIS — H6090 Unspecified otitis externa, unspecified ear: Secondary | ICD-10-CM

## 2013-04-27 DIAGNOSIS — J45909 Unspecified asthma, uncomplicated: Secondary | ICD-10-CM

## 2013-04-27 DIAGNOSIS — J189 Pneumonia, unspecified organism: Secondary | ICD-10-CM

## 2013-04-27 DIAGNOSIS — J019 Acute sinusitis, unspecified: Secondary | ICD-10-CM

## 2013-04-27 DIAGNOSIS — B370 Candidal stomatitis: Secondary | ICD-10-CM

## 2013-04-27 DIAGNOSIS — J209 Acute bronchitis, unspecified: Secondary | ICD-10-CM

## 2013-04-27 MED ORDER — CLINDAMYCIN HCL 300 MG PO CAPS: 300.0000 mg | ORAL_CAPSULE | Freq: Three times a day (TID) | ORAL | Status: DC

## 2013-04-27 MED ORDER — PREDNISONE 10 MG PO TABS: ORAL_TABLET | ORAL | Status: DC

## 2013-04-27 MED ORDER — FLUCONAZOLE 100 MG PO TABS: ORAL_TABLET | ORAL | Status: DC

## 2013-04-27 NOTE — Telephone Encounter (Signed)
ok 

## 2013-04-27 NOTE — Telephone Encounter (Signed)
Pt is wanting to be referred to an ENT Dr. Judeen Hammans or Thornell Mule (not sure of spelling) she was told by Dr Buelah Manis if she wasn't any better to go to an ENT Call back number is [phone]

## 2013-04-27 NOTE — Telephone Encounter (Signed)
MD please advise

## 2013-04-27 NOTE — Patient Instructions (Signed)
Clindamycin 300mg  3 times daily for 7days Increase Align to three times daily at meals while on clindamycin Diflucan 100mg  Take two once then one daily until gone Prednisone 10mg  Take 4 for two days three for two days two for two days one for two days A Chest xray was obtained Obtain ENT evaluation Return 2-3 weeks

## 2013-04-27 NOTE — Progress Notes (Signed)
Subjective:    Patient ID: Ebony Scott, female    DOB: 1950-11-29, 63 y.o.   MRN: [id number]  HPI 63 y.o.. WF atopic moderate persistent asthma, elevated IgE. Started Xolair 05/03/10  Initial Pulmonary Consultation 12/23/09   04/27/2013 Chief Complaint  Patient presents with  . Asthma    follow-up. Pt states she has not felt well for over 1 month. Pt has been on antibiotics from PCP.  Pt states that she has sores in her mouth and her tongue is white, requesting a refil on Magic Mouthwash. Pt states she has been having increased SOB, productive cough with yellow phelgm, discomfort in her back and neck, wheezing as well.   Pt ill for one month. Symptoms : sinus infxn, LN swollen, wheezing, Rx Zpak, then later pred pulse, and another Zpak. Pt not well.  Rx ear drop L ear qid.  L ear pain.   Pt cont to wheeze, still dyspnea, sores on mouth, coughing up mucus, pain into back area.  No CXR. Mucus now is yellow to clear. Blood out of nose. Notes sores on tongue. Notes fever>>100degrees.   L ear still hurts.   [facility]  Past Medical History  Diagnosis Date  . Chronic neck pain   . Allergic rhinitis   . Moderate persistent asthma     -FeV1 72% 2011, -IgE 102 2011, CT sinus Neg 2011  . GERD (gastroesophageal reflux disease)   . Hypothyroidism   . Osteoporosis     on reclast yearly  . Hiatal hernia   . Diverticulosis   . Hemorrhoids   . Asthma   . IBS (irritable bowel syndrome)   . Complication of anesthesia     "had hard time waking up from it several times" (02/20/2012)  . Hypercholesteremia   . Pneumonia 04/2011; ~ 11/2011    "double; single" (02/20/2012)  . Exertional dyspnea   . Graves disease   . Headache(784.0)     "related to allergies; more at different times during the year" (02/20/2012)  . Arthritis     "mostly the hands" (02/20/2012)  . Fibromyalgia 11/2011  . Chronic lower back pain   . Depression     "some; don't take anything for it" (02/20/2012)  . Breast cancer 1998    in  remission  . Anxiety   . DVT (deep venous thrombosis)      Family History  Problem Relation Age of Onset  . Allergies Mother   . Heart disease Mother   . Arthritis Mother   . Lung cancer Mother   . Allergies Father   . Heart disease Father   . Arthritis Father   . Colon cancer      Maternal half aunt/Maternal half uncle  . Colitis Daughter     Review of SystemsConstitutional:   No  weight loss, night sweats,  Fevers, chills,  +fatigue, or  lassitude.  HEENT:   No headaches,  Difficulty swallowing,  Tooth/dental problems, or  Sore throat,                No sneezing, itching, ear ache,  +nasal congestion, post nasal drip,   CV:  No chest pain,  Orthopnea, PND, swelling in lower extremities, anasarca, dizziness, palpitations, syncope.   GI  No heartburn, indigestion, abdominal pain, nausea, vomiting, diarrhea, change in bowel habits, loss of appetite, bloody stools.   Resp:  ,  No coughing up of blood.   No chest wall deformity  Skin: no rash or lesions.  GU: no dysuria, change in color of urine, no urgency or frequency.  No flank pain, no hematuria   MS:  No joint pain or swelling.  No decreased range of motion.  No back pain.  Psych:  No change in mood or affect. No depression or anxiety.  No memory loss.      Objective:   Physical Exam   BP 118/80  Pulse 79  Temp(Src) 97.5 F (36.4 C) (Oral)  Ht 5' 1.75" (1.568 m)  Wt 112 lb 3.2 oz (50.894 kg)  BMI 20.70 kg/m2  SpO2 97%  GEN: A/Ox3; pleasant , NAD, well nourished   HEENT:  [place]/AT,  EACs-clear, TMs-wnl, NOSE-clear drainage , THROAT, nasal purulence Oral candidiasis  NECK:  Supple w/ fair ROM; no JVD; normal carotid impulses w/o bruits; no thyromegaly or nodules palpated; no lymphadenopathy.  RESP  Clear  P & A; expired wheeze, no accessory muscle use, no dullness to percussion  CARD:  RRR, no m/r/g  , no peripheral edema, pulses intact, no cyanosis or clubbing.  GI:   Soft & nt; nml bowel sounds; no  organomegaly or masses detected.  Musco: Warm bil, no deformities or joint swelling noted.   Neuro: alert, no focal deficits noted.    Skin: Warm, no lesions or rashes   Dg Chest 2 View  04/27/2013   CLINICAL DATA:  Cough and shortness of breath  EXAM: CHEST  2 VIEW  COMPARISON:  12/26/2012  FINDINGS: The cardiac shadow is stable. The lungs are clear bilaterally. Postsurgical changes are noted in the left breast and cervical spine. No acute bony abnormality is seen.  IMPRESSION: No active cardiopulmonary disease.   Electronically Signed   By: Inez Catalina M.D.   On: 04/27/2013 16:12       Assessment & Plan:   Severe persistent asthma with significant atopic features Severe persistent asthma , neg CXR for PNA Recent exacerbation, post viral Now mild acute sinusitis Mild oral candidiasis Plan Clindamycin 300mg  3 times daily for 7days Increase Align to three times daily at meals while on clindamycin Diflucan 100mg  Take two once then one daily until gone Prednisone 10mg  Take 4 for two days three for two days two for two days one for two days     Updated Medication List Outpatient Encounter Prescriptions as of 04/27/2013  Medication Sig  . acetaminophen (TYLENOL ARTHRITIS PAIN) 650 MG CR tablet Take 1,300 mg by mouth 2 (two) times daily.    . Alpha-D-Galactosidase (BEANO) TABS Take 1 tablet by mouth daily.   . Alpha-Lipoic Acid 600 MG CAPS Take 600 mg by mouth daily. Once a day  . Alum & Mag Hydroxide-Simeth (MAGIC MOUTHWASH) SOLN Take 5-10 mLs by mouth 3 (three) times daily as needed. For irritation  . Azelastine HCl (ASTEPRO) 0.15 % SOLN Place 2 sprays into the nose daily.  . Calcium Citrate-Vitamin D (CITRACAL MAXIMUM) 315-250 MG-UNIT TABS Take 1 tablet by mouth 2 (two) times daily.   . Cholecalciferol (VITAMIN D) 2000 UNITS CAPS 1/2 at bedtime  . CREON 24000 UNITS CPEP TAKE 1 CAPSULE BY MOUTH DAILY 3-4 TIMES DAILY(WITH EACH MEAL)  . CRESTOR 10 MG tablet TAKE 1 TABLET BY MOUTH  DAILY  . denosumab (PROLIA) 60 MG/ML SOLN injection Inject 60 mg into the skin every 6 (six) months. Administer in upper arm, thigh, or abdomen  . EPINEPHrine (EPIPEN JR) 0.15 MG/0.3ML injection Inject 0.3 mLs (0.15 mg total) into the muscle as needed. For reaction  . gabapentin (NEURONTIN) 600  MG tablet Take 1 tablet (600 mg total) by mouth daily as needed.  . hydrocortisone (PROCTOZONE-HC) 2.5 % rectal cream Apply as needed as directed  . lactase (LACTAID) 3000 UNITS tablet Take 3 tablets by mouth as needed. 3-4 tabs with dairy liquids and solids  . levocetirizine (XYZAL) 5 MG tablet TAKE 1 TABLET BY MOUTH ONCE DAILY  . lidocaine (LIDODERM) 5 % Place 1 patch onto the skin daily as needed. Remove & Discard patch within 12 hours or as directed by MD  . Melatonin 3 MG CAPS Take 9 mg by mouth at bedtime. 3 tabs at bedtime  . mometasone (NASONEX) 50 MCG/ACT nasal spray Place 2 sprays into the nose daily.  . mometasone-formoterol (DULERA) 100-5 MCG/ACT AERO Inhale 1 puff into the lungs 2 (two) times daily.  . montelukast (SINGULAIR) 10 MG tablet TAKE 1 TABLET BY MOUTH ONCE DAILY  . neomycin-bacitracin-polymyxin (NEOSPORIN) ointment Apply 1 application topically every 12 (twelve) hours. Use on/in nose as directed as needed  . omalizumab (XOLAIR) 150 MG injection Inject 150 mg into the skin every 28 (twenty-eight) days. 150 SQ every 4 weeks  . Polyethyl Glycol-Propyl Glycol (SYSTANE ULTRA) 0.4-0.3 % SOLN Apply 1 drop to eye 2 (two) times daily as needed. 1-2 drops each eye every morning and at bedtime  . Prenatal Vit-Sel-Fe Fum-FA (VINATE M) 27-1 MG TABS Take 1 tablet by mouth 2 (two) times daily.  . Probiotic Product (ALIGN) 4 MG CAPS Take 1 tablet by mouth daily. Once a day  . propylthiouracil (PTU) 50 MG tablet take 1/2 tablet (25 mg) twice daily  . pseudoephedrine-guaifenesin (MUCINEX D) 60-600 MG per tablet Take 1 tablet by mouth every 12 (twelve) hours.   . Simethicone (GAS-X EXTRA STRENGTH) 125  MG CAPS Take 2 each by mouth daily as needed. For gas relief  . Sodium Chloride-Sodium Bicarb (CLASSIC NETI POT SINUS WASH NA) Place into the nose. Daily After AM walks  . clindamycin (CLEOCIN) 300 MG capsule Take 1 capsule (300 mg total) by mouth 3 (three) times daily.  . fluconazole (DIFLUCAN) 100 MG tablet Take two once then one daily until gone  . predniSONE (DELTASONE) 10 MG tablet Take 4 for two days three for two days two for two days one for two days  . [DISCONTINUED] antipyrine-benzocaine (AURALGAN) otic solution Place 3-4 drops into the left ear every 2 (two) hours as needed for ear pain.  . [DISCONTINUED] fluconazole (DIFLUCAN) 150 MG tablet Take 1 tablet x 1 day, then repeat on day 3 and 7  . [DISCONTINUED] neomycin-polymyxin-hydrocortisone (CORTISPORIN) otic solution Place 3 drops into the left ear 4 (four) times daily.  . [DISCONTINUED] pseudoephedrine-guaifenesin (MUCINEX D) 60-600 MG per tablet Take 1 tablet by mouth every 12 (twelve) hours.

## 2013-04-28 NOTE — Telephone Encounter (Signed)
Referral ordered

## 2013-04-28 NOTE — Assessment & Plan Note (Signed)
Severe persistent asthma , neg CXR for PNA Recent exacerbation, post viral Now mild acute sinusitis Mild oral candidiasis Plan Clindamycin 300mg  3 times daily for 7days Increase Align to three times daily at meals while on clindamycin Diflucan 100mg  Take two once then one daily until gone Prednisone 10mg  Take 4 for two days three for two days two for two days one for two days

## 2013-04-30 ENCOUNTER — Telehealth: Payer: Self-pay | Admitting: Critical Care Medicine

## 2013-04-30 NOTE — Telephone Encounter (Signed)
Called spoke with pt. She reports she is still having a lot of chest tx, ears still bothering her, still coughing (but not bad enough for cough syrup). She has been wearing mask when outside but wind aggravates her cough/ears. Can't use netti pot bc it puts pressure on ears. She is taking the prednisone, abx and diflucan. Using her inhalers as directed. She also uses her nasal sprays and the nose bleeds are better now. Still mucinex D. Any other recs? Please advise Dr. Annamaria Boots in PW's absence. thanks  Allergies  Allergen Reactions  . Dust Mite Extract Shortness Of Breath and Other (See Comments)    "sneezing" (02/20/2012)  . Molds & Smuts Shortness Of Breath  . Morphine Sulfate Itching  . Other Shortness Of Breath and Other (See Comments)    Grass and weeds "sneezing; filled sinuses" (02/20/2012)  . Penicillins Rash and Other (See Comments)    "welts" (02/20/2012)  . Reclast [Zoledronic Acid]     Put in hospital  . Rofecoxib Swelling    Celebrex REACTION: feet swelling  . Shrimp Flavor Anaphylaxis    ALL SHELLFISH  . Tetracycline Hcl Nausea And Vomiting  . Dilaudid [Hydromorphone Hcl] Itching  . Hydrocodone-Acetaminophen Nausea And Vomiting  . Levofloxacin Other (See Comments)    REACTION: GI upset  . Oxycodone Hcl Nausea And Vomiting  . Paroxetine Nausea And Vomiting    Paxil   . Soy Allergy Other (See Comments)    Limited soy d/t "ESTROGEN POSITIVE" cancer  . Tree Extract Other (See Comments)    "tested and told I was allergic to it; never experienced a reaction to it" (02/20/2012)    Current Outpatient Prescriptions on File Prior to Visit  Medication Sig Dispense Refill  . acetaminophen (TYLENOL ARTHRITIS PAIN) 650 MG CR tablet Take 1,300 mg by mouth 2 (two) times daily.        . Alpha-D-Galactosidase (BEANO) TABS Take 1 tablet by mouth daily.       . Alpha-Lipoic Acid 600 MG CAPS Take 600 mg by mouth daily. Once a day      . Alum & Mag Hydroxide-Simeth (MAGIC MOUTHWASH) SOLN Take  5-10 mLs by mouth 3 (three) times daily as needed. For irritation  240 mL  0  . Azelastine HCl (ASTEPRO) 0.15 % SOLN Place 2 sprays into the nose daily.  30 mL  11  . Calcium Citrate-Vitamin D (CITRACAL MAXIMUM) 315-250 MG-UNIT TABS Take 1 tablet by mouth 2 (two) times daily.       . Cholecalciferol (VITAMIN D) 2000 UNITS CAPS 1/2 at bedtime      . clindamycin (CLEOCIN) 300 MG capsule Take 1 capsule (300 mg total) by mouth 3 (three) times daily.  21 capsule  0  . CREON 24000 UNITS CPEP TAKE 1 CAPSULE BY MOUTH DAILY 3-4 TIMES DAILY(WITH EACH MEAL)  180 each  11  . CRESTOR 10 MG tablet TAKE 1 TABLET BY MOUTH DAILY  30 tablet  5  . denosumab (PROLIA) 60 MG/ML SOLN injection Inject 60 mg into the skin every 6 (six) months. Administer in upper arm, thigh, or abdomen      . EPINEPHrine (EPIPEN JR) 0.15 MG/0.3ML injection Inject 0.3 mLs (0.15 mg total) into the muscle as needed. For reaction  1 each  4  . fluconazole (DIFLUCAN) 100 MG tablet Take two once then one daily until gone  7 tablet  0  . gabapentin (NEURONTIN) 600 MG tablet Take 1 tablet (600 mg total) by mouth daily as  needed.  90 tablet  0  . hydrocortisone (PROCTOZONE-HC) 2.5 % rectal cream Apply as needed as directed  30 g  5  . lactase (LACTAID) 3000 UNITS tablet Take 3 tablets by mouth as needed. 3-4 tabs with dairy liquids and solids      . levocetirizine (XYZAL) 5 MG tablet TAKE 1 TABLET BY MOUTH ONCE DAILY  30 tablet  11  . lidocaine (LIDODERM) 5 % Place 1 patch onto the skin daily as needed. Remove & Discard patch within 12 hours or as directed by MD      . Melatonin 3 MG CAPS Take 9 mg by mouth at bedtime. 3 tabs at bedtime      . mometasone (NASONEX) 50 MCG/ACT nasal spray Place 2 sprays into the nose daily.  17 g  11  . mometasone-formoterol (DULERA) 100-5 MCG/ACT AERO Inhale 1 puff into the lungs 2 (two) times daily.  1 Inhaler  6  . montelukast (SINGULAIR) 10 MG tablet TAKE 1 TABLET BY MOUTH ONCE DAILY  30 tablet  11  .  neomycin-bacitracin-polymyxin (NEOSPORIN) ointment Apply 1 application topically every 12 (twelve) hours. Use on/in nose as directed as needed      . omalizumab (XOLAIR) 150 MG injection Inject 150 mg into the skin every 28 (twenty-eight) days. 150 SQ every 4 weeks      . Polyethyl Glycol-Propyl Glycol (SYSTANE ULTRA) 0.4-0.3 % SOLN Apply 1 drop to eye 2 (two) times daily as needed. 1-2 drops each eye every morning and at bedtime      . predniSONE (DELTASONE) 10 MG tablet Take 4 for two days three for two days two for two days one for two days  20 tablet  0  . Prenatal Vit-Sel-Fe Fum-FA (VINATE M) 27-1 MG TABS Take 1 tablet by mouth 2 (two) times daily.  60 each  11  . Probiotic Product (ALIGN) 4 MG CAPS Take 1 tablet by mouth daily. Once a day      . propylthiouracil (PTU) 50 MG tablet take 1/2 tablet (25 mg) twice daily      . pseudoephedrine-guaifenesin (MUCINEX D) 60-600 MG per tablet Take 1 tablet by mouth every 12 (twelve) hours.       . Simethicone (GAS-X EXTRA STRENGTH) 125 MG CAPS Take 2 each by mouth daily as needed. For gas relief      . Sodium Chloride-Sodium Bicarb (CLASSIC NETI POT SINUS WASH NA) Place into the nose. Daily After AM walks      . [DISCONTINUED] methimazole (TAPAZOLE) 5 MG tablet Take 5 mg by mouth 3 (three) times daily.         No current facility-administered medications on file prior to visit.

## 2013-04-30 NOTE — Telephone Encounter (Signed)
Pt aware of recs per [facility] Pt is requesting another rec of decongestant--pt has been using Mucinex-D x 2-54months--no improvement Pt wanting to know if there is another type of decongestant that can be recommended or anything to speed up the process of her congestion.  Pt okay with waiting for response until tomorrow morning ( 05/01/13) Please advise Dr Annamaria Boots. Thanks.

## 2013-04-30 NOTE — Telephone Encounter (Signed)
She is on the right medicines. Appropriate to wear a mask when outdoors. Would wear cotton in ears if the outside wind bothers. See if this helps through the weekend. Consider a decongestant like Mucinex-D

## 2013-04-30 NOTE — Telephone Encounter (Signed)
Questionnaire copied below:    Ebony Scott - Questionnaire Submission ','[facility] Detail >>          Questionnaire Submission  Ebony Scott  MRN: [id number] DOB: Apr 23, 1950     Pt Home: [phone]     Sent: Thu April 30, 2013 7:32 AM   Entered: [phone]                           Current View: Showing all answers Show Only Relevant Answers    Legend: Scores, Non-relevant Questions     Patient Responses     Chl Mychart After Visit Questionnaire    Question 04/30/2013 7:32 AM   How are you feeling after your recent visit? not much better, yet.Still short of breath and sinus are still pretty bad.   Does the recommended course of treatment seem to be helping your symptoms? somewhat, maybe just need more time   Are you experiencing any side effects from your recommended treatment? yes,GI and they have started already!   is there anything else you would like to ask your physician? Do I need anything else for my heavy chest and difficulty breathing? or do I just need to wait a little longer?         Message    Patient Questionnaire Submission   --------------------------------      Questionnaire: Questionnaire      Question: How are you feeling after your recent visit?   Answer: not much better, yet.Still short of breath and sinus are still pretty bad.      Question: Does the recommended course of treatment seem to be helping your symptoms?   Answer: somewhat, maybe just need more time      Question: Are you experiencing any side effects from your recommended treatment?   Answer: yes,GI and they have started already!      Question: is there anything else you would like to ask your physician?   Answer: Do I need anything else for my heavy chest and difficulty breathing? or do I just need to wait a little longer?      ----- Message -----   From: Asencion Noble, MD   Sent: 04/28/2013 6:44 AM   To: Ebony Scott   Subject: Questionnaire       To ensure  we are providing you the highest quality healthcare, we'd like to know how you are feeling after your recent visit. At your earliest convenience, please complete the brief follow-up assessment by clicking the Task: Questionnaire link listed above.      Thank you for your time in helping [place] improve our services and for partnering with [place] in your wellness and care.      Sincerely,      Your Care Team

## 2013-05-01 NOTE — Telephone Encounter (Signed)
Pt aware. Nothing further needed 

## 2013-05-01 NOTE — Telephone Encounter (Signed)
i have no other suggestions. mucinex D best there is

## 2013-05-04 ENCOUNTER — Other Ambulatory Visit: Payer: Self-pay | Admitting: Critical Care Medicine

## 2013-05-06 NOTE — Telephone Encounter (Signed)
Received refill request for Diflucan.  Did pt mean to request? Called, spoke with pt.  Pt states the pharm filled this yesterday -- was authorized by Dr. Samella Parr office.  Pt also states she was seen Dr. Thornell Mule who also gave her nystatin suspension to use along with the Diflucan.  Pt states nothing further is needed at this time.  She will call back if anything further is needed.

## 2013-05-13 ENCOUNTER — Ambulatory Visit (INDEPENDENT_AMBULATORY_CARE_PROVIDER_SITE_OTHER): Payer: Medicare Other

## 2013-05-13 DIAGNOSIS — J45909 Unspecified asthma, uncomplicated: Secondary | ICD-10-CM

## 2013-05-18 ENCOUNTER — Encounter: Payer: Self-pay | Admitting: *Deleted

## 2013-05-18 ENCOUNTER — Encounter: Payer: Self-pay | Admitting: Critical Care Medicine

## 2013-05-18 ENCOUNTER — Ambulatory Visit (INDEPENDENT_AMBULATORY_CARE_PROVIDER_SITE_OTHER): Payer: Medicare Other | Admitting: Critical Care Medicine

## 2013-05-18 VITALS — BP 120/80 | HR 80 | Temp 97.8°F | Ht 61.25 in | Wt 116.0 lb

## 2013-05-18 DIAGNOSIS — J45909 Unspecified asthma, uncomplicated: Secondary | ICD-10-CM

## 2013-05-18 DIAGNOSIS — Z23 Encounter for immunization: Secondary | ICD-10-CM

## 2013-05-18 DIAGNOSIS — J329 Chronic sinusitis, unspecified: Secondary | ICD-10-CM

## 2013-05-18 MED ORDER — OMALIZUMAB 150 MG ~~LOC~~ SOLR
150.0000 mg | Freq: Once | SUBCUTANEOUS | Status: AC
  Administered 2013-05-18: 150 mg via SUBCUTANEOUS

## 2013-05-18 NOTE — Assessment & Plan Note (Signed)
Severe persistent asthma with significant atopic features Recent exacerbation precipitated by acute on chronic sinusitis rule out persistent sinusitis at this time Plan Prevnar 13 today A CT Sinus will be obtained No change in medications

## 2013-05-18 NOTE — Progress Notes (Signed)
Subjective:    Patient ID: Ebony Scott, female    DOB: Oct 26, 1950, 63 y.o.   MRN: [id number]  HPI 63 y.o.. WF atopic moderate persistent asthma, elevated IgE. Started Xolair 05/03/10  Initial Pulmonary Consultation 12/23/09   04/27/2013 Chief Complaint  Patient presents with  . Asthma    follow-up. Pt states she has not felt well for over 1 month. Pt has been on antibiotics from PCP.  Pt states that she has sores in her mouth and her tongue is white, requesting a refil on Magic Mouthwash. Pt states she has been having increased SOB, productive cough with yellow phelgm, discomfort in her back and neck, wheezing as well.   Pt ill for one month. Symptoms : sinus infxn, LN swollen, wheezing, Rx Zpak, then later pred pulse, and another Zpak. Pt not well.  Rx ear drop L ear qid.  L ear pain.   Pt cont to wheeze, still dyspnea, sores on mouth, coughing up mucus, pain into back area.  No CXR. Mucus now is yellow to clear. Blood out of nose. Notes sores on tongue. Notes fever>>100degrees.   L ear still hurts.   [facility]  05/18/2013 Chief Complaint  Patient presents with  . Asthma    Breathing is worse since last OV. Reports SOB, chest tightness, coughing and sneezing.  At last ov we rec: Clindamycin 300mg  3 times daily for 7days Increase Align to three times daily at meals while on clindamycin Diflucan 100mg  Take two once then one daily until gone Prednisone 10mg  Take 4 for two days three for two days two for two days one for two days  Pt finished ABX and prednisone.  Now is worse.  Pt saw ENT: ear was ok, was inflammed.  Rx Eye drops to the ear.  This did not help the ear pain f/u 05/26/13. neti pot makes the L ear worse.  Pt notes pain over L eye and face and L ear.   Pt notes more dyspnea .   The cleocin helped the sinuses.  Did have frequent stools on the cleocin PFR 250-275 (low)    Diflucan helped but still got candida in mouth  Needs prevnar 13     Past Medical History  Diagnosis  Date  . Chronic neck pain   . Allergic rhinitis   . Moderate persistent asthma     -FeV1 72% 2011, -IgE 102 2011, CT sinus Neg 2011  . GERD (gastroesophageal reflux disease)   . Hypothyroidism   . Osteoporosis     on reclast yearly  . Hiatal hernia   . Diverticulosis   . Hemorrhoids   . Asthma   . IBS (irritable bowel syndrome)   . Complication of anesthesia     "had hard time waking up from it several times" (02/20/2012)  . Hypercholesteremia   . Pneumonia 04/2011; ~ 11/2011    "double; single" (02/20/2012)  . Exertional dyspnea   . Graves disease   . Headache(784.0)     "related to allergies; more at different times during the year" (02/20/2012)  . Arthritis     "mostly the hands" (02/20/2012)  . Fibromyalgia 11/2011  . Chronic lower back pain   . Depression     "some; don't take anything for it" (02/20/2012)  . Breast cancer 1998    in remission  . Anxiety   . DVT (deep venous thrombosis)      Family History  Problem Relation Age of Onset  . Allergies Mother   .  Heart disease Mother   . Arthritis Mother   . Lung cancer Mother   . Allergies Father   . Heart disease Father   . Arthritis Father   . Colon cancer      Maternal half aunt/Maternal half uncle  . Colitis Daughter     Review of SystemsConstitutional:   No  weight loss, night sweats,  Fevers, chills,  +fatigue, or  lassitude.  HEENT:   No headaches,  Difficulty swallowing,  Tooth/dental problems, or  Sore throat,                No sneezing, itching, ear ache,  +nasal congestion, post nasal drip,   CV:  No chest pain,  Orthopnea, PND, swelling in lower extremities, anasarca, dizziness, palpitations, syncope.   GI  No heartburn, indigestion, abdominal pain, nausea, vomiting, diarrhea, change in bowel habits, loss of appetite, bloody stools.   Resp:  ,  No coughing up of blood.   No chest wall deformity  Skin: no rash or lesions.  GU: no dysuria, change in color of urine, no urgency or frequency.  No flank  pain, no hematuria   MS:  No joint pain or swelling.  No decreased range of motion.  No back pain.  Psych:  No change in mood or affect. No depression or anxiety.  No memory loss.      Objective:   Physical Exam   BP 120/80  Pulse 80  Temp(Src) 97.8 F (36.6 C) (Oral)  Ht 5' 1.25" (1.556 m)  Wt 116 lb (52.617 kg)  BMI 21.73 kg/m2  SpO2 95%  GEN: A/Ox3; pleasant , NAD, well nourished   HEENT:  [place]/AT,  EACs-clear, TMs-wnl, NOSE-clear drainage , THROAT, nasal purulence Oral candidiasis  NECK:  Supple w/ fair ROM; no JVD; normal carotid impulses w/o bruits; no thyromegaly or nodules palpated; no lymphadenopathy.  RESP  Clear  P & A; expired wheeze, no accessory muscle use, no dullness to percussion  CARD:  RRR, no m/r/g  , no peripheral edema, pulses intact, no cyanosis or clubbing.  GI:   Soft & nt; nml bowel sounds; no organomegaly or masses detected.  Musco: Warm bil, no deformities or joint swelling noted.   Neuro: alert, no focal deficits noted.    Skin: Warm, no lesions or rashes   No results found.     Assessment & Plan:   Severe persistent asthma with significant atopic features Severe persistent asthma with significant atopic features Recent exacerbation precipitated by acute on chronic sinusitis rule out persistent sinusitis at this time Plan Prevnar 13 today A CT Sinus will be obtained No change in medications     Updated Medication List Outpatient Encounter Prescriptions as of 05/18/2013  Medication Sig  . acetaminophen (TYLENOL ARTHRITIS PAIN) 650 MG CR tablet Take 1,300 mg by mouth 2 (two) times daily.    . Alpha-D-Galactosidase (BEANO) TABS Take 1 tablet by mouth daily.   . Alpha-Lipoic Acid 600 MG CAPS Take 600 mg by mouth daily. Once a day  . Alum & Mag Hydroxide-Simeth (MAGIC MOUTHWASH) SOLN Take 5-10 mLs by mouth 3 (three) times daily as needed. For irritation  . Azelastine HCl (ASTEPRO) 0.15 % SOLN Place 2 sprays into the nose daily.  .  Calcium Citrate-Vitamin D (CITRACAL MAXIMUM) 315-250 MG-UNIT TABS Take 1 tablet by mouth 2 (two) times daily.   . Cholecalciferol (VITAMIN D) 2000 UNITS CAPS 1/2 at bedtime  . CREON 24000 UNITS CPEP TAKE 1 CAPSULE BY MOUTH DAILY  3-4 TIMES DAILY(WITH EACH MEAL)  . CRESTOR 10 MG tablet TAKE 1 TABLET BY MOUTH DAILY  . denosumab (PROLIA) 60 MG/ML SOLN injection Inject 60 mg into the skin every 6 (six) months. Administer in upper arm, thigh, or abdomen  . EPINEPHrine (EPIPEN JR) 0.15 MG/0.3ML injection Inject 0.3 mLs (0.15 mg total) into the muscle as needed. For reaction  . gabapentin (NEURONTIN) 600 MG tablet Take 1 tablet (600 mg total) by mouth daily as needed.  . hydrocortisone (PROCTOZONE-HC) 2.5 % rectal cream Apply as needed as directed  . lactase (LACTAID) 3000 UNITS tablet Take 3 tablets by mouth as needed. 3-4 tabs with dairy liquids and solids  . levocetirizine (XYZAL) 5 MG tablet TAKE 1 TABLET BY MOUTH ONCE DAILY  . lidocaine (LIDODERM) 5 % Place 1 patch onto the skin daily as needed. Remove & Discard patch within 12 hours or as directed by MD  . Melatonin 3 MG CAPS Take 9 mg by mouth at bedtime. 3 tabs at bedtime  . mometasone (NASONEX) 50 MCG/ACT nasal spray Place 2 sprays into the nose daily.  . mometasone-formoterol (DULERA) 100-5 MCG/ACT AERO Inhale 1 puff into the lungs 2 (two) times daily.  . montelukast (SINGULAIR) 10 MG tablet TAKE 1 TABLET BY MOUTH ONCE DAILY  . neomycin-bacitracin-polymyxin (NEOSPORIN) ointment Apply 1 application topically every 12 (twelve) hours. Use on/in nose as directed as needed  . omalizumab (XOLAIR) 150 MG injection Inject 150 mg into the skin every 28 (twenty-eight) days. 150 SQ every 4 weeks  . Polyethyl Glycol-Propyl Glycol (SYSTANE ULTRA) 0.4-0.3 % SOLN Apply 1 drop to eye 2 (two) times daily as needed. 1-2 drops each eye every morning and at bedtime  . Prenatal Vit-Sel-Fe Fum-FA (VINATE M) 27-1 MG TABS Take 1 tablet by mouth 2 (two) times daily.  .  Probiotic Product (ALIGN) 4 MG CAPS Take 1 tablet by mouth daily. Once a day  . propylthiouracil (PTU) 50 MG tablet take 1/2 tablet (25 mg) twice daily  . pseudoephedrine-guaifenesin (MUCINEX D) 60-600 MG per tablet Take 1 tablet by mouth every 12 (twelve) hours.   . Simethicone (GAS-X EXTRA STRENGTH) 125 MG CAPS Take 2 each by mouth daily as needed. For gas relief  . Sodium Chloride-Sodium Bicarb (CLASSIC NETI POT SINUS WASH NA) Place into the nose. Daily After AM walks  . [DISCONTINUED] clindamycin (CLEOCIN) 300 MG capsule Take 1 capsule (300 mg total) by mouth 3 (three) times daily.  . [DISCONTINUED] fluconazole (DIFLUCAN) 100 MG tablet Take two once then one daily until gone  . [DISCONTINUED] predniSONE (DELTASONE) 10 MG tablet Take 4 for two days three for two days two for two days one for two days

## 2013-05-18 NOTE — Patient Instructions (Addendum)
Prevnar 13 today A CT Sinus will be obtained No change in medications I will call with result, will likely need Sinus Specialist referral ( since Dr Elwyn Reach does not do sinuses) Return 2 months on schedule

## 2013-05-21 ENCOUNTER — Ambulatory Visit (INDEPENDENT_AMBULATORY_CARE_PROVIDER_SITE_OTHER)
Admission: RE | Admit: 2013-05-21 | Discharge: 2013-05-21 | Disposition: A | Discharge status: Home / Self Care | Payer: Medicare Other | Source: Ambulatory Visit | Attending: Critical Care Medicine | Admitting: Critical Care Medicine

## 2013-05-21 DIAGNOSIS — J329 Chronic sinusitis, unspecified: Secondary | ICD-10-CM

## 2013-05-21 NOTE — Progress Notes (Signed)
Quick Note:  Notify the patient that the CT Scan Sinus Xray is negative for chronic sinusitis. No change in medications are recommended. Continue current meds as prescribed at last office visit ______

## 2013-05-25 ENCOUNTER — Telehealth: Payer: Self-pay | Admitting: Critical Care Medicine

## 2013-05-25 DIAGNOSIS — J302 Other seasonal allergic rhinitis: Secondary | ICD-10-CM

## 2013-05-25 DIAGNOSIS — J3089 Other allergic rhinitis: Secondary | ICD-10-CM

## 2013-05-25 NOTE — Telephone Encounter (Signed)
Called and made pt aware.  She wants to know if we can refer to see someone. She reports she can't keep going on like this and symptoms not getting better. Please advise Dr. Joya Gaskins thanks

## 2013-05-25 NOTE — Telephone Encounter (Signed)
Called made pt aware of recs. Referral placed

## 2013-05-25 NOTE — Telephone Encounter (Signed)
She does not have a sinus infection Likely inflammation from allergies  Stay on current meds No new ideas or Rx to offer this pt

## 2013-05-25 NOTE — Telephone Encounter (Signed)
Ok with me to refer to allergy MD...suggest Kozlow I dont have anything else I can offer her

## 2013-05-25 NOTE — Telephone Encounter (Signed)
      Result Notes    Notes Recorded by Elsie Stain, MD on 05/21/2013 at 3:31 PM Notify the patient that the CT Scan Sinus Xray is negative for chronic sinusitis. No change in medications are recommended. Continue current meds as prescribed at last office visit   --------  Called, spoke with pt.  Informed her of above results and recs.  She verbalized understanding.  Reports her symptoms have not improved since last OV.  Still having sinus pressure around eyes, SOB, feels extremely fatigued, and is coughing more at times from baseline.  Cough is prod but unsure of color or mucus.  Pt would like to know how she should proceed.  Dr. Joya Gaskins, pls advise. Thank you.

## 2013-05-25 NOTE — Progress Notes (Signed)
Quick Note:  Called, spoke with pt. Informed her of results and recs per Dr. Joya Gaskins. She verbalized understanding. Symptoms have not improved. Please see phone msg from 05/25/13 for further information. ______

## 2013-05-31 ENCOUNTER — Emergency Department (HOSPITAL_COMMUNITY)
Admission: EM | Admit: 2013-05-31 | Discharge: 2013-05-31 | Disposition: A | Discharge status: Home / Self Care | Payer: Medicare Other | Source: Home / Self Care | Attending: Family Medicine | Admitting: Family Medicine

## 2013-05-31 ENCOUNTER — Encounter (HOSPITAL_COMMUNITY): Payer: Self-pay | Admitting: Emergency Medicine

## 2013-05-31 DIAGNOSIS — S60012A Contusion of left thumb without damage to nail, initial encounter: Secondary | ICD-10-CM

## 2013-05-31 DIAGNOSIS — IMO0002 Reserved for concepts with insufficient information to code with codable children: Secondary | ICD-10-CM

## 2013-05-31 DIAGNOSIS — S6000XA Contusion of unspecified finger without damage to nail, initial encounter: Secondary | ICD-10-CM

## 2013-05-31 MED ORDER — TRAMADOL HCL 50 MG PO TABS
50.0000 mg | ORAL_TABLET | Freq: Four times a day (QID) | ORAL | Status: DC | PRN
Start: 2013-05-31 — End: 2013-07-17

## 2013-05-31 NOTE — ED Notes (Signed)
Pt reports injury to left hand between thumb and wrist onset yesterday. Pt reports she hit her hand on a table. Hx of thumb surgery. Pt uses hand brace with no relief. Pt is alert and oriented and in no acute distress.

## 2013-05-31 NOTE — Discharge Instructions (Signed)
Wear splint for comfort, use ice and medication as needed, see dr sypher if further problems.

## 2013-05-31 NOTE — ED Provider Notes (Signed)
CSN: [id number]     Arrival date & time 05/31/13  5361 History   First MD Initiated Contact with Patient 05/31/13 0920     Chief Complaint  Patient presents with  . Hand Pain   (Consider location/radiation/quality/duration/timing/severity/associated sxs/prior Treatment) Patient is a 63 y.o. female presenting with hand injury. The history is provided by the patient.  Hand Injury Location:  Hand Hand location:  [place] of L hand Pain details:    Quality:  Aching   Radiates to:  Does not radiate   Severity:  Moderate   Onset quality:  Sudden (struck hand on under side of cabinet yest, , can't take the pain.)   Timing:  Constant Chronicity:  New Dislocation: no   Associated symptoms: no numbness     Past Medical History  Diagnosis Date  . Chronic neck pain   . Allergic rhinitis   . Moderate persistent asthma     -FeV1 72% 2011, -IgE 102 2011, CT sinus Neg 2011  . GERD (gastroesophageal reflux disease)   . Hypothyroidism   . Osteoporosis     on reclast yearly  . Hiatal hernia   . Diverticulosis   . Hemorrhoids   . Asthma   . IBS (irritable bowel syndrome)   . Complication of anesthesia     "had hard time waking up from it several times" (02/20/2012)  . Hypercholesteremia   . Pneumonia 04/2011; ~ 11/2011    "double; single" (02/20/2012)  . Exertional dyspnea   . Graves disease   . Headache(784.0)     "related to allergies; more at different times during the year" (02/20/2012)  . Arthritis     "mostly the hands" (02/20/2012)  . Fibromyalgia 11/2011  . Chronic lower back pain   . Depression     "some; don't take anything for it" (02/20/2012)  . Breast cancer 1998    in remission  . Anxiety   . DVT (deep venous thrombosis)    Past Surgical History  Procedure Laterality Date  . Cervical fusion  2003    C3-C4  . Cervical discectomy  10/2001    C5-C6  . Vesicovaginal fistula closure w/ tah  1988  . Nasal septum surgery  1980's  . Anterior and posterior repair  1990's  .  Breast lumpectomy  1998    left  . Debridement tennis elbow  ?1970's    right  . Knee arthroplasty  ?1990's    "?right; w/cartilage repair" (02/20/2012)  . Carpometacarpel ([facility]) fusion of thumb with autograft from radius  ~ 2009    "both thumbs" (02/20/2012)  . Cataract extraction w/ intraocular lens  implant, bilateral  2012  . Tonsillectomy  ~ 1953  . Posterior cervical fusion/foraminotomy  2004    "failed initial fusion; rewired  anterior neck" (02/20/2012)   Family History  Problem Relation Age of Onset  . Allergies Mother   . Heart disease Mother   . Arthritis Mother   . Lung cancer Mother   . Allergies Father   . Heart disease Father   . Arthritis Father   . Colon cancer      Maternal half aunt/Maternal half uncle  . Colitis Daughter    History  Substance Use Topics  . Smoking status: Never Smoker   . Smokeless tobacco: Never Used  . Alcohol Use: 0.0 oz/week     Comment: 02/20/2012 "couple glasses of wine/6 months"   OB History   Grav Para Term Preterm Abortions TAB SAB Ect Mult Living  Review of Systems  Constitutional: Negative.   Musculoskeletal: Negative for joint swelling.  Skin: Positive for wound.    Allergies  Dust mite extract; Molds & smuts; Morphine sulfate; Other; Penicillins; Reclast; Rofecoxib; Shrimp flavor; Tetracycline hcl; Dilaudid; Hydrocodone-acetaminophen; Levofloxacin; Oxycodone hcl; Paroxetine; Soy allergy; and Tree extract  Home Medications   Current Outpatient Rx  Name  Route  Sig  Dispense  Refill  . acetaminophen (TYLENOL ARTHRITIS PAIN) 650 MG CR tablet   Oral   Take 1,300 mg by mouth 2 (two) times daily.           . Alpha-D-Galactosidase (BEANO) TABS   Oral   Take 1 tablet by mouth daily.          . Alpha-Lipoic Acid 600 MG CAPS   Oral   Take 600 mg by mouth daily. Once a day         . Alum & Mag Hydroxide-Simeth (MAGIC MOUTHWASH) SOLN   Oral   Take 5-10 mLs by mouth 3 (three) times daily as needed. For  irritation   240 mL   0   . Azelastine HCl (ASTEPRO) 0.15 % SOLN   Nasal   Place 2 sprays into the nose daily.   30 mL   11   . Calcium Citrate-Vitamin D (CITRACAL MAXIMUM) 315-250 MG-UNIT TABS   Oral   Take 1 tablet by mouth 2 (two) times daily.          . Cholecalciferol (VITAMIN D) 2000 UNITS CAPS      1/2 at bedtime         . CREON 24000 UNITS CPEP      TAKE 1 CAPSULE BY MOUTH DAILY 3-4 TIMES DAILY(WITH EACH MEAL)   180 each   11   . CRESTOR 10 MG tablet      TAKE 1 TABLET BY MOUTH DAILY   30 tablet   5   . denosumab (PROLIA) 60 MG/ML SOLN injection   Subcutaneous   Inject 60 mg into the skin every 6 (six) months. Administer in upper arm, thigh, or abdomen         . EPINEPHrine (EPIPEN JR) 0.15 MG/0.3ML injection   Intramuscular   Inject 0.3 mLs (0.15 mg total) into the muscle as needed. For reaction   1 each   4   . gabapentin (NEURONTIN) 600 MG tablet   Oral   Take 1 tablet (600 mg total) by mouth daily as needed.   90 tablet   0   . hydrocortisone (PROCTOZONE-HC) 2.5 % rectal cream      Apply as needed as directed   30 g   5   . lactase (LACTAID) 3000 UNITS tablet   Oral   Take 3 tablets by mouth as needed. 3-4 tabs with dairy liquids and solids         . levocetirizine (XYZAL) 5 MG tablet      TAKE 1 TABLET BY MOUTH ONCE DAILY   30 tablet   11   . lidocaine (LIDODERM) 5 %   Transdermal   Place 1 patch onto the skin daily as needed. Remove & Discard patch within 12 hours or as directed by MD         . Melatonin 3 MG CAPS   Oral   Take 9 mg by mouth at bedtime. 3 tabs at bedtime         . mometasone (NASONEX) 50 MCG/ACT nasal spray   Nasal   Place 2 sprays into  the nose daily.   17 g   11   . mometasone-formoterol (DULERA) 100-5 MCG/ACT AERO   Inhalation   Inhale 1 puff into the lungs 2 (two) times daily.   1 Inhaler   6   . montelukast (SINGULAIR) 10 MG tablet      TAKE 1 TABLET BY MOUTH ONCE DAILY   30 tablet    11   . neomycin-bacitracin-polymyxin (NEOSPORIN) ointment   Topical   Apply 1 application topically every 12 (twelve) hours. Use on/in nose as directed as needed         . omalizumab (XOLAIR) 150 MG injection   Subcutaneous   Inject 150 mg into the skin every 28 (twenty-eight) days. 150 SQ every 4 weeks         . Polyethyl Glycol-Propyl Glycol (SYSTANE ULTRA) 0.4-0.3 % SOLN   Ophthalmic   Apply 1 drop to eye 2 (two) times daily as needed. 1-2 drops each eye every morning and at bedtime         . Prenatal Vit-Sel-Fe Fum-FA (VINATE M) 27-1 MG TABS   Oral   Take 1 tablet by mouth 2 (two) times daily.   60 each   11   . Probiotic Product (ALIGN) 4 MG CAPS   Oral   Take 1 tablet by mouth daily. Once a day         . propylthiouracil (PTU) 50 MG tablet      take 1/2 tablet (25 mg) twice daily         . pseudoephedrine-guaifenesin (MUCINEX D) 60-600 MG per tablet   Oral   Take 1 tablet by mouth every 12 (twelve) hours.          . Simethicone (GAS-X EXTRA STRENGTH) 125 MG CAPS   Oral   Take 2 each by mouth daily as needed. For gas relief         . Sodium Chloride-Sodium Bicarb (CLASSIC NETI POT SINUS WASH NA)   Nasal   Place into the nose. Daily After AM walks         . traMADol (ULTRAM) 50 MG tablet   Oral   Take 1 tablet (50 mg total) by mouth every 6 (six) hours as needed. For pain   8 tablet   0    BP 148/77  Pulse 74  Temp(Src) 97.8 F (36.6 C) (Oral)  Resp 16  SpO2 95% Physical Exam  Nursing note and vitals reviewed. Constitutional: She is oriented to person, place, and time. She appears well-developed and well-nourished. She appears distressed.  Musculoskeletal: She exhibits tenderness.       Hands: Neurological: She is alert and oriented to person, place, and time.  Skin: Skin is warm and dry.    ED Course  Procedures (including critical care time) Labs Review Labs Reviewed - No data to display Imaging Review No results found.   MDM    1. Contusion of left thumb without damage to nail        Billy Fischer, MD 05/31/13 [phone]

## 2013-06-02 ENCOUNTER — Other Ambulatory Visit: Payer: Self-pay | Admitting: Family Medicine

## 2013-06-02 MED ORDER — PSEUDOEPHEDRINE-GUAIFENESIN ER 60-600 MG PO TB12: 1.0000 | ORAL_TABLET | Freq: Two times a day (BID) | ORAL | Status: DC

## 2013-06-02 NOTE — Telephone Encounter (Signed)
Rx Refilled  

## 2013-06-04 ENCOUNTER — Other Ambulatory Visit: Payer: Self-pay | Admitting: Family Medicine

## 2013-06-04 MED ORDER — GABAPENTIN 600 MG PO TABS: 600.0000 mg | ORAL_TABLET | Freq: Every day | ORAL | Status: DC | PRN

## 2013-06-04 NOTE — Telephone Encounter (Signed)
Med refilled.

## 2013-06-08 ENCOUNTER — Encounter: Payer: Self-pay | Admitting: Critical Care Medicine

## 2013-06-15 ENCOUNTER — Ambulatory Visit (INDEPENDENT_AMBULATORY_CARE_PROVIDER_SITE_OTHER): Payer: Medicare Other

## 2013-06-15 DIAGNOSIS — J45909 Unspecified asthma, uncomplicated: Secondary | ICD-10-CM

## 2013-06-16 ENCOUNTER — Encounter: Payer: Self-pay | Admitting: Critical Care Medicine

## 2013-06-17 MED ORDER — OMALIZUMAB 150 MG ~~LOC~~ SOLR
150.0000 mg | Freq: Once | SUBCUTANEOUS | Status: AC
  Administered 2013-06-17: 150 mg via SUBCUTANEOUS

## 2013-07-07 ENCOUNTER — Telehealth: Payer: Self-pay | Admitting: Family Medicine

## 2013-07-07 ENCOUNTER — Telehealth: Payer: Self-pay | Admitting: Critical Care Medicine

## 2013-07-07 MED ORDER — FLUCONAZOLE 100 MG PO TABS: ORAL_TABLET | ORAL | Status: DC

## 2013-07-07 NOTE — Telephone Encounter (Signed)
Wanted to verify due date for next Prolia injection.  Next injection due 08/24/13.

## 2013-07-07 NOTE — Telephone Encounter (Signed)
There is no relation ship between [employer] and allergy test results Call in diflucan 100mg  Take two once then one daily until gone #7 No other recs

## 2013-07-07 NOTE — Telephone Encounter (Signed)
Message copied by Olena Mater on Tue Jul 07, 2013 11:16 AM ------      Message from: Lenore Manner      Created: Tue Jul 07, 2013 10:26 AM      Regarding: Prolia      Contact: [phone]       Pt has a question about the prolia ------

## 2013-07-07 NOTE — Telephone Encounter (Signed)
Spoke with the pt and notified of recs per PW  She verbalized understanding  Nothing further needed

## 2013-07-07 NOTE — Telephone Encounter (Signed)
Pt states that she is having a humidity issues in her home. Bought dehumidifier over there weekend. Pt states that humidity/moisture in bedroom is 70%-- pt reports that her peak flow meter levels are around 250 which is decreased from her normal levels. Pt feels her increased breathing issues are d/t humidity level in home being to so high. Pt reports that technician is coming out to check HVAC system to make sure there is no blockage in the system and to also check for mold. Pt states that she is wearing a mask when she goes outside to avoid upsetting her airways further.  Pt states that she still has yeast in her mouth--would like recs -- Nystatin Mouthwash?  Patient saw Dr Vanita Ingles had allergy testing 06/16/13 -- only allergen that showed up on test was an allergy to mold Pt states that she had Xolair injection 2 days prior to having allergy testing done. Pt states that 1-2 years ago she had a lot more allergen that showed up on the allergy testing and this time there is only 1 allergen. Could getting Xolair so close to allergy testing have caused "false" results?  Please advise Dr Joya Gaskins. Thanks.

## 2013-07-09 ENCOUNTER — Other Ambulatory Visit: Payer: Self-pay

## 2013-07-09 DIAGNOSIS — Z1231 Encounter for screening mammogram for malignant neoplasm of breast: Secondary | ICD-10-CM

## 2013-07-15 ENCOUNTER — Ambulatory Visit: Payer: Medicare Other

## 2013-07-16 ENCOUNTER — Ambulatory Visit: Payer: Medicare Other

## 2013-07-17 ENCOUNTER — Ambulatory Visit (INDEPENDENT_AMBULATORY_CARE_PROVIDER_SITE_OTHER): Payer: Medicare Other | Admitting: Critical Care Medicine

## 2013-07-17 ENCOUNTER — Encounter: Payer: Self-pay | Admitting: Critical Care Medicine

## 2013-07-17 ENCOUNTER — Encounter: Payer: Self-pay | Admitting: *Deleted

## 2013-07-17 ENCOUNTER — Ambulatory Visit (INDEPENDENT_AMBULATORY_CARE_PROVIDER_SITE_OTHER): Payer: Medicare Other

## 2013-07-17 VITALS — BP 144/74 | HR 75 | Temp 98.1°F | Ht 61.25 in | Wt 111.0 lb

## 2013-07-17 DIAGNOSIS — J45909 Unspecified asthma, uncomplicated: Secondary | ICD-10-CM

## 2013-07-17 MED ORDER — ALBUTEROL SULFATE HFA 108 (90 BASE) MCG/ACT IN AERS: 1.0000 | INHALATION_SPRAY | Freq: Four times a day (QID) | RESPIRATORY_TRACT | Status: DC | PRN

## 2013-07-17 MED ORDER — NYSTATIN 100000 UNIT/ML MT SUSP: 5.0000 mL | Freq: Four times a day (QID) | OROMUCOSAL | Status: DC | PRN

## 2013-07-17 NOTE — Patient Instructions (Addendum)
Nystatin as needed No other changes Return 3 months

## 2013-07-17 NOTE — Progress Notes (Signed)
Subjective:    Patient ID: Ebony Scott, female    DOB: 03-12-1950, 63 y.o.   MRN: [id number]  HPI 63 y.o.. WF atopic moderate persistent asthma, elevated IgE. Started Xolair 05/03/10  Initial Pulmonary Consultation 12/23/09   04/27/2013 Chief Complaint  Patient presents with  . Asthma    follow-up. Pt states she has not felt well for over 1 month. Pt has been on antibiotics from PCP.  Pt states that she has sores in her mouth and her tongue is white, requesting a refil on Magic Mouthwash. Pt states she has been having increased SOB, productive cough with yellow phelgm, discomfort in her back and neck, wheezing as well.   Pt ill for one month. Symptoms : sinus infxn, LN swollen, wheezing, Rx Zpak, then later pred pulse, and another Zpak. Pt not well.  Rx ear drop L ear qid.  L ear pain.   Pt cont to wheeze, still dyspnea, sores on mouth, coughing up mucus, pain into back area.  No CXR. Mucus now is yellow to clear. Blood out of nose. Notes sores on tongue. Notes fever>>100degrees.   L ear still hurts.   [facility]  05/18/2013 Chief Complaint  Patient presents with  . Asthma    Breathing is worse since last OV. Reports SOB, chest tightness, coughing and sneezing.  At last ov we rec: Clindamycin 300mg  3 times daily for 7days Increase Align to three times daily at meals while on clindamycin Diflucan 100mg  Take two once then one daily until gone Prednisone 10mg  Take 4 for two days three for two days two for two days one for two days  Pt finished ABX and prednisone.  Now is worse.  Pt saw ENT: ear was ok, was inflammed.  Rx Eye drops to the ear.  This did not help the ear pain f/u 05/26/13. neti pot makes the L ear worse.  Pt notes pain over L eye and face and L ear.   Pt notes more dyspnea .   The cleocin helped the sinuses.  Did have frequent stools on the cleocin PFR 250-275 (low)    Diflucan helped but still got candida in mouth  Needs prevnar 13  07/17/2013 Chief Complaint  Patient  presents with  . Asthma    Breathing has good days and bad days. Reports hot weather causes issues at time. Doesn't know if Xolair is working.  Good and bad days now.  To the end of April, hot water leaking in garage, had this replaced.  Humidity increased during this period. Now has dehumidifiers running.  45-50% range.   Needs a letter for this equipment.   PFR 250-275 range.   CT Sinus was done.   Pt saw [facility] and only showed mold allergies PUL ASTHMA HISTORY 07/17/2013 08/22/2011 01/31/2011 09/20/2010  Symptoms 0-2 days/week Daily 0-2 days/week Daily  Nighttime awakenings 0-2/month 0-2/month 0-2/month -  Interference with activity Minor limitations Some limitations No limitations -  SABA use 0-2 days/wk 0-2 days/wk 0-2 days/wk -  Exacerbations requiring oral steroids 0-1 / year 0-1 / year 0-1 / year -     Review of SystemsConstitutional:   No  weight loss, night sweats,  Fevers, chills,  +fatigue, or  lassitude.  HEENT:   No headaches,  Difficulty swallowing,  Tooth/dental problems, or  Sore throat,                No sneezing, itching, ear ache,  +nasal congestion, post nasal drip,   CV:  No chest pain,  Orthopnea, PND, swelling in lower extremities, anasarca, dizziness, palpitations, syncope.   GI  No heartburn, indigestion, abdominal pain, nausea, vomiting, diarrhea, change in bowel habits, loss of appetite, bloody stools.   Resp:  ,  No coughing up of blood.   No chest wall deformity  Skin: no rash or lesions.  GU: no dysuria, change in color of urine, no urgency or frequency.  No flank pain, no hematuria   MS:  No joint pain or swelling.  No decreased range of motion.  No back pain.  Psych:  No change in mood or affect. No depression or anxiety.  No memory loss.      Objective:   Physical Exam   BP 144/74  Pulse 75  Temp(Src) 98.1 F (36.7 C) (Oral)  Ht 5' 1.25" (1.556 m)  Wt 111 lb (50.349 kg)  BMI 20.80 kg/m2  SpO2 97%  GEN: A/Ox3; pleasant , NAD, well  nourished   HEENT:  [place]/AT,  EACs-clear, TMs-wnl, NOSE-clear drainage , THROAT, nasal purulence Oral candidiasis  NECK:  Supple w/ fair ROM; no JVD; normal carotid impulses w/o bruits; no thyromegaly or nodules palpated; no lymphadenopathy.  RESP  Clear  P & A; expired wheeze, no accessory muscle use, no dullness to percussion  CARD:  RRR, no m/r/g  , no peripheral edema, pulses intact, no cyanosis or clubbing.  GI:   Soft & nt; nml bowel sounds; no organomegaly or masses detected.  Musco: Warm bil, no deformities or joint swelling noted.   Neuro: alert, no focal deficits noted.    Skin: Warm, no lesions or rashes   No results found.     Assessment & Plan:   Severe persistent asthma with significant atopic features Severe persistent asthma with significant atopic features Plan No change in inhaled or maintenance medications. Nystatin oral mouthwash    Updated Medication List Outpatient Encounter Prescriptions as of 07/17/2013  Medication Sig  . acetaminophen (TYLENOL ARTHRITIS PAIN) 650 MG CR tablet Take 1,300 mg by mouth 2 (two) times daily.    . Alpha-D-Galactosidase (BEANO) TABS Take 1 tablet by mouth daily.   . Alpha-Lipoic Acid 600 MG CAPS Take 600 mg by mouth daily. Once a day  . Azelastine HCl (ASTEPRO) 0.15 % SOLN Place 2 sprays into the nose daily.  . Calcium Citrate-Vitamin D (CITRACAL MAXIMUM) 315-250 MG-UNIT TABS Take 1 tablet by mouth 2 (two) times daily.   . Cholecalciferol (VITAMIN D) 2000 UNITS CAPS 1/2 at bedtime  . CREON 24000 UNITS CPEP TAKE 1 CAPSULE BY MOUTH DAILY 3-4 TIMES DAILY(WITH EACH MEAL)  . CRESTOR 10 MG tablet TAKE 1 TABLET BY MOUTH DAILY  . cyproheptadine (PERIACTIN) 4 MG tablet Take 2 tablets by mouth daily.  Marland Kitchen denosumab (PROLIA) 60 MG/ML SOLN injection Inject 60 mg into the skin every 6 (six) months. Administer in upper arm, thigh, or abdomen  . EPINEPHrine (EPIPEN JR) 0.15 MG/0.3ML injection Inject 0.3 mLs (0.15 mg total) into the muscle  as needed. For reaction  . gabapentin (NEURONTIN) 600 MG tablet Take 1 tablet (600 mg total) by mouth daily as needed.  . hydrocortisone (PROCTOZONE-HC) 2.5 % rectal cream Apply as needed as directed  . lactase (LACTAID) 3000 UNITS tablet Take 3 tablets by mouth as needed. 3-4 tabs with dairy liquids and solids  . lidocaine (LIDODERM) 5 % Place 1 patch onto the skin daily as needed. Remove & Discard patch within 12 hours or as directed by MD  . Melatonin 3  MG CAPS Take 9 mg by mouth at bedtime. 3 tabs at bedtime  . mometasone (NASONEX) 50 MCG/ACT nasal spray Place 2 sprays into the nose daily.  . mometasone-formoterol (DULERA) 100-5 MCG/ACT AERO Inhale 1 puff into the lungs 2 (two) times daily.  . montelukast (SINGULAIR) 10 MG tablet TAKE 1 TABLET BY MOUTH ONCE DAILY  . neomycin-bacitracin-polymyxin (NEOSPORIN) ointment Apply 1 application topically every 12 (twelve) hours. Use on/in nose as directed as needed  . omalizumab (XOLAIR) 150 MG injection Inject 150 mg into the skin every 28 (twenty-eight) days. 150 SQ every 4 weeks  . Polyethyl Glycol-Propyl Glycol (SYSTANE ULTRA) 0.4-0.3 % SOLN Apply 1 drop to eye 2 (two) times daily as needed. 1-2 drops each eye every morning and at bedtime  . Prenatal Vit-Sel-Fe Fum-FA (VINATE M) 27-1 MG TABS Take 1 tablet by mouth 2 (two) times daily.  . Probiotic Product (ALIGN) 4 MG CAPS Take 1 tablet by mouth daily. Once a day  . propylthiouracil (PTU) 50 MG tablet take 1/2 tablet (25 mg) twice daily  . pseudoephedrine-guaifenesin (MUCINEX D) 60-600 MG per tablet Take 1 tablet by mouth every 12 (twelve) hours.  . Simethicone (GAS-X EXTRA STRENGTH) 125 MG CAPS Take 2 each by mouth daily as needed. For gas relief  . albuterol (PROAIR HFA) 108 (90 BASE) MCG/ACT inhaler Inhale 1-2 puffs into the lungs every 6 (six) hours as needed for wheezing or shortness of breath.  . nystatin (MYCOSTATIN) 100000 UNIT/ML suspension Take 5 mLs (500,000 Units total) by mouth 4 (four)  times daily as needed.  . [DISCONTINUED] Alum & Mag Hydroxide-Simeth (MAGIC MOUTHWASH) SOLN Take 5-10 mLs by mouth 3 (three) times daily as needed. For irritation  . [DISCONTINUED] fluconazole (DIFLUCAN) 100 MG tablet 2 today, then 1 daily  . [DISCONTINUED] levocetirizine (XYZAL) 5 MG tablet TAKE 1 TABLET BY MOUTH ONCE DAILY  . [DISCONTINUED] Sodium Chloride-Sodium Bicarb (CLASSIC NETI POT SINUS [place] NA) Place into the nose. Daily After AM walks  . [DISCONTINUED] traMADol (ULTRAM) 50 MG tablet Take 1 tablet (50 mg total) by mouth every 6 (six) hours as needed. For pain

## 2013-07-18 NOTE — Assessment & Plan Note (Signed)
Severe persistent asthma with significant atopic features Plan No change in inhaled or maintenance medications. Nystatin oral mouthwash

## 2013-07-21 MED ORDER — OMALIZUMAB 150 MG ~~LOC~~ SOLR
150.0000 mg | Freq: Once | SUBCUTANEOUS | Status: AC
  Administered 2013-07-21: 150 mg via SUBCUTANEOUS

## 2013-07-22 ENCOUNTER — Encounter: Payer: Self-pay | Admitting: Internal Medicine

## 2013-07-27 ENCOUNTER — Other Ambulatory Visit: Payer: Self-pay | Admitting: Critical Care Medicine

## 2013-07-27 ENCOUNTER — Other Ambulatory Visit: Payer: Self-pay | Admitting: Family Medicine

## 2013-07-27 MED ORDER — PSEUDOEPHEDRINE-GUAIFENESIN ER 60-600 MG PO TB12: 1.0000 | ORAL_TABLET | Freq: Two times a day (BID) | ORAL | Status: DC

## 2013-07-27 MED ORDER — ROSUVASTATIN CALCIUM 10 MG PO TABS: ORAL_TABLET | ORAL | Status: DC

## 2013-07-27 NOTE — Telephone Encounter (Signed)
Rx Refilled  

## 2013-08-04 ENCOUNTER — Other Ambulatory Visit: Payer: Medicare Other

## 2013-08-04 ENCOUNTER — Telehealth: Payer: Self-pay | Admitting: Family Medicine

## 2013-08-04 DIAGNOSIS — Z79899 Other long term (current) drug therapy: Secondary | ICD-10-CM

## 2013-08-04 DIAGNOSIS — E785 Hyperlipidemia, unspecified: Secondary | ICD-10-CM

## 2013-08-04 DIAGNOSIS — Z0000 Encounter for general adult medical examination without abnormal findings: Secondary | ICD-10-CM

## 2013-08-04 DIAGNOSIS — E038 Other specified hypothyroidism: Secondary | ICD-10-CM

## 2013-08-04 LAB — CBC WITH DIFFERENTIAL/PLATELET
Basophils Absolute: 0.1 10*3/uL (ref 0.0–0.1)
Basophils Relative: 1 % (ref 0–1)
Eosinophils Absolute: 0.6 10*3/uL (ref 0.0–0.7)
Eosinophils Relative: 10 % — ABNORMAL HIGH (ref 0–5)
HCT: 33.5 % — ABNORMAL LOW (ref 36.0–46.0)
Hemoglobin: 11.3 g/dL — ABNORMAL LOW (ref 12.0–15.0)
Lymphocytes Relative: 31 % (ref 12–46)
Lymphs Abs: 1.9 10*3/uL (ref 0.7–4.0)
MCH: 32.8 pg (ref 26.0–34.0)
MCHC: 33.7 g/dL (ref 30.0–36.0)
MCV: 97.1 fL (ref 78.0–100.0)
Monocytes Absolute: 0.7 10*3/uL (ref 0.1–1.0)
Monocytes Relative: 12 % (ref 3–12)
Neutro Abs: 2.8 10*3/uL (ref 1.7–7.7)
Neutrophils Relative %: 46 % (ref 43–77)
Platelets: 221 10*3/uL (ref 150–400)
RBC: 3.45 MIL/uL — ABNORMAL LOW (ref 3.87–5.11)
RDW: 13.9 % (ref 11.5–15.5)
WBC: 6.1 10*3/uL (ref 4.0–10.5)

## 2013-08-04 LAB — LIPID PANEL
Cholesterol: 169 mg/dL (ref 0–200)
HDL: 94 mg/dL (ref >39)
LDL Cholesterol: 62 mg/dL (ref 0–99)
Total CHOL/HDL Ratio: 1.8 Ratio
Triglycerides: 65 mg/dL (ref <150)
VLDL: 13 mg/dL (ref 0–40)

## 2013-08-04 LAB — BASIC METABOLIC PANEL
BUN: 29 mg/dL — ABNORMAL HIGH (ref 6–23)
CO2: 28 mEq/L (ref 19–32)
Calcium: 9.7 mg/dL (ref 8.4–10.5)
Chloride: 103 mEq/L (ref 96–112)
Creat: 1.03 mg/dL (ref 0.50–1.10)
Glucose, Bld: 88 mg/dL (ref 70–99)
Potassium: 4.7 mEq/L (ref 3.5–5.3)
Sodium: 139 mEq/L (ref 135–145)

## 2013-08-04 LAB — TSH: TSH: 4.036 u[IU]/mL (ref 0.350–4.500)

## 2013-08-04 MED ORDER — HYDROCORTISONE 2.5 % RE CREA: TOPICAL_CREAM | RECTAL | Status: DC

## 2013-08-04 NOTE — Telephone Encounter (Signed)
°  [facility]   pT is needing a refill on hydrocortisone (PROCTOZONE-HC) 2.5 % rectal cream   Please call once sent to pharmacy

## 2013-08-04 NOTE — Telephone Encounter (Signed)
Med sent to pharm 

## 2013-08-14 ENCOUNTER — Ambulatory Visit (INDEPENDENT_AMBULATORY_CARE_PROVIDER_SITE_OTHER): Payer: Medicare Other

## 2013-08-14 ENCOUNTER — Encounter: Payer: Self-pay | Admitting: Family Medicine

## 2013-08-14 ENCOUNTER — Ambulatory Visit (INDEPENDENT_AMBULATORY_CARE_PROVIDER_SITE_OTHER): Payer: Medicare Other | Admitting: Family Medicine

## 2013-08-14 VITALS — BP 130/78 | HR 72 | Temp 98.0°F | Resp 14 | Ht 62.0 in | Wt 111.0 lb

## 2013-08-14 DIAGNOSIS — J45909 Unspecified asthma, uncomplicated: Secondary | ICD-10-CM

## 2013-08-14 DIAGNOSIS — M810 Age-related osteoporosis without current pathological fracture: Secondary | ICD-10-CM

## 2013-08-14 DIAGNOSIS — Z0000 Encounter for general adult medical examination without abnormal findings: Secondary | ICD-10-CM

## 2013-08-14 MED ORDER — DENOSUMAB 60 MG/ML ~~LOC~~ SOLN
60.0000 mg | Freq: Once | SUBCUTANEOUS | Status: AC
  Administered 2013-08-14: 60 mg via SUBCUTANEOUS

## 2013-08-14 MED ORDER — OMALIZUMAB 150 MG ~~LOC~~ SOLR
150.0000 mg | Freq: Once | SUBCUTANEOUS | Status: AC
  Administered 2013-08-14: 150 mg via SUBCUTANEOUS

## 2013-08-14 NOTE — Progress Notes (Signed)
Subjective:    Patient ID: Ebony Scott, female    DOB: [birth year], 63 y.o.   MRN: [id number]  HPI Subjective:   Patient is here today for complete physical exam. Her preventive care is up to date. Her next colonoscopy is not due until 2017. She's not due for a pneumonia vaccine until 2018. She has had her shingles vaccine as well as her tetanus vaccine. Her bone density is due in 2016. Her mammogram and Pap smear are performed her gynecologist. She has no other medical concerns today. The remainder of her review of systems is negative.  Review Past Medical/Family/Social: Past Medical History  Diagnosis Date  . Chronic neck pain   . Allergic rhinitis   . Moderate persistent asthma     -FeV1 72% 2011, -IgE 102 2011, CT sinus Neg 2011  . GERD (gastroesophageal reflux disease)   . Hypothyroidism   . Osteoporosis     on reclast yearly  . Hiatal hernia   . Diverticulosis   . Hemorrhoids   . Asthma   . IBS (irritable bowel syndrome)   . Complication of anesthesia     "had hard time waking up from it several times" (02/20/2012)  . Hypercholesteremia   . Pneumonia 04/2011; ~ 11/2011    "double; single" (02/20/2012)  . Exertional dyspnea   . Graves disease   . Headache(784.0)     "related to allergies; more at different times during the year" (02/20/2012)  . Arthritis     "mostly the hands" (02/20/2012)  . Fibromyalgia 11/2011  . Chronic lower back pain   . Depression     "some; don't take anything for it" (02/20/2012)  . Breast cancer 1998    in remission  . Anxiety   . DVT (deep venous thrombosis)    Past Surgical History  Procedure Laterality Date  . Cervical fusion  2003    C3-C4  . Cervical discectomy  10/2001    C5-C6  . Vesicovaginal fistula closure w/ tah  1988  . Nasal septum surgery  1980's  . Anterior and posterior repair  1990's  . Breast lumpectomy  1998    left  . Debridement tennis elbow  ?1970's    right  . Knee arthroplasty  ?1990's    "?right; w/cartilage  repair" (02/20/2012)  . Carpometacarpel ([facility]) fusion of thumb with autograft from radius  ~ 2009    "both thumbs" (02/20/2012)  . Cataract extraction w/ intraocular lens  implant, bilateral  2012  . Tonsillectomy  ~ 1953  . Posterior cervical fusion/foraminotomy  2004    "failed initial fusion; rewired  anterior neck" (02/20/2012)   Current Outpatient Prescriptions on File Prior to Visit  Medication Sig Dispense Refill  . acetaminophen (TYLENOL ARTHRITIS PAIN) 650 MG CR tablet Take 1,300 mg by mouth 2 (two) times daily.        Marland Kitchen albuterol (PROAIR HFA) 108 (90 BASE) MCG/ACT inhaler Inhale 1-2 puffs into the lungs every 6 (six) hours as needed for wheezing or shortness of breath.  1 Inhaler  3  . Alpha-D-Galactosidase (BEANO) TABS Take 1 tablet by mouth daily.       . Alpha-Lipoic Acid 600 MG CAPS Take 600 mg by mouth daily. Once a day      . Azelastine HCl (ASTEPRO) 0.15 % SOLN Place 2 sprays into the nose daily.  30 mL  11  . Calcium Citrate-Vitamin D (CITRACAL MAXIMUM) 315-250 MG-UNIT TABS Take 1 tablet by mouth 2 (two) times daily.       Marland Kitchen  Cholecalciferol (VITAMIN D) 2000 UNITS CAPS 1/2 at bedtime      . CREON 24000 UNITS CPEP TAKE 1 CAPSULE BY MOUTH DAILY 3-4 TIMES DAILY(WITH EACH MEAL)  180 each  11  . cyproheptadine (PERIACTIN) 4 MG tablet Take 2 tablets by mouth daily.      Marland Kitchen denosumab (PROLIA) 60 MG/ML SOLN injection Inject 60 mg into the skin every 6 (six) months. Administer in upper arm, thigh, or abdomen      . DULERA 100-5 MCG/ACT AERO INHALE 1 PUFF BY MOUTH TWICE DAILY  13 g  5  . EPINEPHrine (EPIPEN JR) 0.15 MG/0.3ML injection Inject 0.3 mLs (0.15 mg total) into the muscle as needed. For reaction  1 each  4  . gabapentin (NEURONTIN) 600 MG tablet Take 1 tablet (600 mg total) by mouth daily as needed.  90 tablet  3  . hydrocortisone (PROCTOZONE-HC) 2.5 % rectal cream Apply as needed as directed  30 g  11  . lactase (LACTAID) 3000 UNITS tablet Take 3 tablets by mouth as needed. 3-4 tabs  with dairy liquids and solids      . lidocaine (LIDODERM) 5 % Place 1 patch onto the skin daily as needed. Remove & Discard patch within 12 hours or as directed by MD      . Melatonin 3 MG CAPS Take 9 mg by mouth at bedtime. 3 tabs at bedtime      . mometasone (NASONEX) 50 MCG/ACT nasal spray Place 2 sprays into the nose daily.  17 g  11  . mometasone-formoterol (DULERA) 100-5 MCG/ACT AERO Inhale 1 puff into the lungs 2 (two) times daily.  1 Inhaler  6  . montelukast (SINGULAIR) 10 MG tablet TAKE 1 TABLET BY MOUTH ONCE DAILY  30 tablet  11  . neomycin-bacitracin-polymyxin (NEOSPORIN) ointment Apply 1 application topically every 12 (twelve) hours. Use on/in nose as directed as needed      . nystatin (MYCOSTATIN) 100000 UNIT/ML suspension Take 5 mLs (500,000 Units total) by mouth 4 (four) times daily as needed.  180 mL  4  . omalizumab (XOLAIR) 150 MG injection Inject 150 mg into the skin every 28 (twenty-eight) days. 150 SQ every 4 weeks      . Polyethyl Glycol-Propyl Glycol (SYSTANE ULTRA) 0.4-0.3 % SOLN Apply 1 drop to eye 2 (two) times daily as needed. 1-2 drops each eye every morning and at bedtime      . Prenatal Vit-Sel-Fe Fum-FA (VINATE M) 27-1 MG TABS Take 1 tablet by mouth 2 (two) times daily.  60 each  11  . Probiotic Product (ALIGN) 4 MG CAPS Take 1 tablet by mouth daily. Once a day      . propylthiouracil (PTU) 50 MG tablet take 1/2 tablet (25 mg) twice daily      . pseudoephedrine-guaifenesin (MUCINEX D) 60-600 MG per tablet Take 1 tablet by mouth every 12 (twelve) hours.  60 tablet  3  . rosuvastatin (CRESTOR) 10 MG tablet TAKE 1 TABLET BY MOUTH DAILY  30 tablet  11  . Simethicone (GAS-X EXTRA STRENGTH) 125 MG CAPS Take 2 each by mouth daily as needed. For gas relief      . [DISCONTINUED] methimazole (TAPAZOLE) 5 MG tablet Take 5 mg by mouth 3 (three) times daily.         No current facility-administered medications on file prior to visit.   Allergies  Allergen Reactions  . Dust  Mite Extract Shortness Of Breath and Other (See Comments)    "sneezing" (  02/20/2012)  . Molds & Smuts Shortness Of Breath  . Morphine Sulfate Itching  . Other Shortness Of Breath and Other (See Comments)    Grass and weeds "sneezing; filled sinuses" (02/20/2012)  . Penicillins Rash and Other (See Comments)    "welts" (02/20/2012)  . Reclast [Zoledronic Acid]     Put in hospital  . Rofecoxib Swelling    Celebrex REACTION: feet swelling  . Shrimp Flavor Anaphylaxis    ALL SHELLFISH  . Tetracycline Hcl Nausea And Vomiting  . Dilaudid [Hydromorphone Hcl] Itching  . Hydrocodone-Acetaminophen Nausea And Vomiting  . Levofloxacin Other (See Comments)    REACTION: GI upset  . Oxycodone Hcl Nausea And Vomiting  . Paroxetine Nausea And Vomiting    Paxil   . Soy Allergy Other (See Comments)    Limited soy d/t "ESTROGEN POSITIVE" cancer  . Tree Extract Other (See Comments)    "tested and told I was allergic to it; never experienced a reaction to it" (02/20/2012)   History   Social History  . Marital Status: Divorced    Spouse Name: N/A    Number of Children: 2  . Years of Education: college   Occupational History  . Disabled     Retired Print production planner  .     Social History Main Topics  . Smoking status: Never Smoker   . Smokeless tobacco: Never Used  . Alcohol Use: 0.0 oz/week     Comment: 02/20/2012 "couple glasses of wine/6 months"  . Drug Use: No  . Sexual Activity: No   Other Topics Concern  . Not on file   Social History Narrative   Patient lives at home alone. Patient  divorced.    Patient has her BS degree.   Right handed.   Caffeine- sometimes coffee.            Family History  Problem Relation Age of Onset  . Allergies Mother   . Heart disease Mother   . Arthritis Mother   . Lung cancer Mother   . Allergies Father   . Heart disease Father   . Arthritis Father   . Colon cancer      Maternal half aunt/Maternal half uncle  . Colitis Daughter        Risk Factors  Current exercise habits:  Dietary issues discussed:   Cardiac risk factors: Obesity (BMI >= 30 kg/m2).   Depression Screen  (Note: if answer to either of the following is "Yes", a more complete depression screening is indicated)  Over the past two weeks, have you felt down, depressed or hopeless? No Over the past two weeks, have you felt little interest or pleasure in doing things? No Have you lost interest or pleasure in daily life? No Do you often feel hopeless? No Do you cry easily over simple problems? No   Activities of Daily Living  In your present state of health, do you have any difficulty performing the following activities?:  Driving? No  Managing money? No  Feeding yourself? No  Getting from bed to chair? No  Climbing a flight of stairs? No  Preparing food and eating?: No  Bathing or showering? No  Getting dressed: No  Getting to the toilet? No  Using the toilet:No  Moving around from place to place: No  In the past year have you fallen or had a near fall?:No  Are you sexually active? No  Do you have more than one partner? No   Hearing Difficulties: No  Do you often ask people to speak up or repeat themselves? No  Do you experience ringing or noises in your ears? No Do you have difficulty understanding soft or whispered voices? No  Do you feel that you have a problem with memory? No Do you often misplace items? No  Do you feel safe at home? Yes  Cognitive Testing  Alert? Yes Normal Appearance?Yes  Oriented to person? Yes Place? Yes  Time? Yes  Recall of three objects? Yes  Can perform simple calculations? Yes  Displays appropriate judgment?Yes  Can read the correct time from a watch face?Yes    Screening Tests / Date Colonoscopy           2007          Zostavax UTD Mammogram 09/2012 Influenza Vaccine UTD Tetanus/tdap 2007 Pneumovax 2013 Prevnar 2015       Review of Systems  All other systems reviewed and are  negative.      Objective:   Physical Exam  Vitals reviewed. Constitutional: She is oriented to person, place, and time. She appears well-developed and well-nourished. No distress.  HENT:  Head: Normocephalic and atraumatic.  Right Ear: External ear normal.  Left Ear: External ear normal.  Nose: Nose normal.  Mouth/Throat: Oropharynx is clear and moist. No oropharyngeal exudate.  Eyes: Conjunctivae and EOM are normal. Pupils are equal, round, and reactive to light. Right eye exhibits no discharge. Left eye exhibits no discharge. No scleral icterus.  Neck: Normal range of motion. Neck supple. No JVD present. No tracheal deviation present. No thyromegaly present.  Cardiovascular: Normal rate, regular rhythm, normal heart sounds and intact distal pulses.  Exam reveals no gallop and no friction rub.   No murmur heard. Pulmonary/Chest: Effort normal and breath sounds normal. No stridor. No respiratory distress. She has no wheezes. She has no rales. She exhibits no tenderness.  Abdominal: Soft. Bowel sounds are normal. She exhibits no distension and no mass. There is no tenderness. There is no rebound and no guarding.  Musculoskeletal: Normal range of motion. She exhibits no edema and no tenderness.  Lymphadenopathy:    She has no cervical adenopathy.  Neurological: She is alert and oriented to person, place, and time. She has normal reflexes. She displays normal reflexes. No cranial nerve deficit. She exhibits normal muscle tone. Coordination normal.  Skin: Skin is warm. No rash noted. She is not diaphoretic. No erythema. No pallor.  Psychiatric: She has a normal mood and affect. Her behavior is normal. Judgment and thought content normal.          Assessment & Plan:  1. Routine general medical examination at a health care facility Patient's physical exam is completely normal.  Preventative care is up-to-date. date. Her next colonoscopy is not due until 2017. She's not due for a pneumonia  vaccine until 2018. She has had her shingles vaccine as well as her tetanus vaccine. Her bone density is due in 2016. Her mammogram and Pap smear are performed her gynecologist. Her lab work is listed below and is excellent: Lab on 08/04/2013  Component Date Value Ref Range Status  . WBC 08/04/2013 6.1  4.0 - 10.5 K/uL Final  . RBC 08/04/2013 3.45* 3.87 - 5.11 MIL/uL Final  . Hemoglobin 08/04/2013 11.3* 12.0 - 15.0 g/dL Final  . HCT 08/04/2013 33.5* 36.0 - 46.0 % Final  . MCV 08/04/2013 97.1  78.0 - 100.0 fL Final  . MCH 08/04/2013 32.8  26.0 - 34.0 pg Final  . MCHC  08/04/2013 33.7  30.0 - 36.0 g/dL Final  . RDW 08/04/2013 13.9  11.5 - 15.5 % Final  . Platelets 08/04/2013 221  150 - 400 K/uL Final  . Neutrophils Relative % 08/04/2013 46  43 - 77 % Final  . Neutro Abs 08/04/2013 2.8  1.7 - 7.7 K/uL Final  . Lymphocytes Relative 08/04/2013 31  12 - 46 % Final  . Lymphs Abs 08/04/2013 1.9  0.7 - 4.0 K/uL Final  . Monocytes Relative 08/04/2013 12  3 - 12 % Final  . Monocytes Absolute 08/04/2013 0.7  0.1 - 1.0 K/uL Final  . Eosinophils Relative 08/04/2013 10* 0 - 5 % Final  . Eosinophils Absolute 08/04/2013 0.6  0.0 - 0.7 K/uL Final  . Basophils Relative 08/04/2013 1  0 - 1 % Final  . Basophils Absolute 08/04/2013 0.1  0.0 - 0.1 K/uL Final  . Smear Review 08/04/2013 Criteria for review not met   Final  . Cholesterol 08/04/2013 169  0 - 200 mg/dL Final   Comment: ATP III Classification:                                < 200        mg/dL        Desirable                               200 - 239     mg/dL        Borderline High                               >= 240        mg/dL        High                             . Triglycerides 08/04/2013 65  <150 mg/dL Final  . HDL 08/04/2013 94  >39 mg/dL Final  . Total CHOL/HDL Ratio 08/04/2013 1.8   Final  . VLDL 08/04/2013 13  0 - 40 mg/dL Final  . LDL Cholesterol 08/04/2013 62  0 - 99 mg/dL Final   Comment:                            Total  Cholesterol/HDL Ratio:CHD Risk                                                 Coronary Heart Disease Risk Table                                                                 Men       Women                                   1/2 Average Risk  3.4        3.3                                       Average Risk              5.0        4.4                                    2X Average Risk              9.6        7.1                                    3X Average Risk             23.4       11.0                          Use the calculated Patient Ratio above and the CHD Risk table                           to determine the patient's CHD Risk.                          ATP III Classification (LDL):                                < 100        mg/dL         Optimal                               100 - 129     mg/dL         Near or Above Optimal                               130 - 159     mg/dL         Borderline High                               160 - 189     mg/dL         High                                > 190        mg/dL         Very High                             . TSH 08/04/2013 4.036  0.350 - 4.500 uIU/mL Final  . Sodium 08/04/2013 139  135 - 145 mEq/L Final  . Potassium 08/04/2013 4.7  3.5 - 5.3 mEq/L Final  . Chloride 08/04/2013 103  96 -  112 mEq/L Final  . CO2 08/04/2013 28  19 - 32 mEq/L Final  . Glucose, Bld 08/04/2013 88  70 - 99 mg/dL Final  . BUN 08/04/2013 29* 6 - 23 mg/dL Final  . Creat 08/04/2013 1.03  0.50 - 1.10 mg/dL Final  . Calcium 08/04/2013 9.7  8.4 - 10.5 mg/dL Final   Regular anticipatory guidance is provided. Followup in 6 months to recheck cholesterol.  Medicare Attestation  I have personally reviewed:  The patient's medical and social history  Their use of alcohol, tobacco or illicit drugs  Their current medications and supplements  The patient's functional ability including ADLs,fall risks, home safety risks, cognitive, and hearing and visual  impairment  Diet and physical activities  Evidence for depression or mood disorders  The patient's weight, height, BMI, and visual acuity have been recorded in the chart. I have made referrals, counseling, and provided education to the patient based on review of the above and I have provided the patient with a written personalized care plan for preventive services.

## 2013-08-31 ENCOUNTER — Other Ambulatory Visit: Payer: Self-pay | Admitting: Family Medicine

## 2013-08-31 ENCOUNTER — Telehealth: Payer: Self-pay | Admitting: *Deleted

## 2013-08-31 MED ORDER — LIDOCAINE 5 % EX PTCH: 1.0000 | MEDICATED_PATCH | Freq: Every day | CUTANEOUS | Status: DC | PRN

## 2013-08-31 NOTE — Telephone Encounter (Signed)
Received PA determination.   PA denied.   Please advise.  

## 2013-08-31 NOTE — Telephone Encounter (Signed)
Received e-mail requesting PA for Lidoderm Patches.   PA submitted.

## 2013-09-01 ENCOUNTER — Encounter: Payer: Self-pay | Admitting: *Deleted

## 2013-09-01 NOTE — Telephone Encounter (Signed)
Notify patient lidoderm has been refused by insurance and PA failed.

## 2013-09-01 NOTE — Telephone Encounter (Signed)
Call placed to patient and patient made aware.  

## 2013-09-01 NOTE — Telephone Encounter (Signed)
Patient requesting appeal for Lidoderm patches.   Letter sent to insurance.

## 2013-09-02 NOTE — Telephone Encounter (Signed)
Received call requesting more information about PA.   Information given via phone call.

## 2013-09-10 NOTE — Telephone Encounter (Signed)
Received PA appeal determination.   Appeal denied.   Patient made aware and requested MD to advise on alternative.

## 2013-09-11 ENCOUNTER — Ambulatory Visit (INDEPENDENT_AMBULATORY_CARE_PROVIDER_SITE_OTHER): Payer: Medicare Other

## 2013-09-11 DIAGNOSIS — J45901 Unspecified asthma with (acute) exacerbation: Secondary | ICD-10-CM

## 2013-09-11 DIAGNOSIS — J4531 Mild persistent asthma with (acute) exacerbation: Secondary | ICD-10-CM

## 2013-09-11 NOTE — Telephone Encounter (Signed)
There is no equivalent alternative to lidoderm patches.  She can use NSAIDS for analgesia or tylenol.

## 2013-09-11 NOTE — Telephone Encounter (Signed)
Call placed to patient and patient made aware.   Patient states that she has already filed a new appeal with insurance.   Insurance will contact office if they need more information.

## 2013-09-15 ENCOUNTER — Telehealth: Payer: Self-pay | Admitting: Critical Care Medicine

## 2013-09-15 ENCOUNTER — Encounter: Payer: Self-pay | Admitting: Critical Care Medicine

## 2013-09-15 ENCOUNTER — Encounter: Payer: Self-pay | Admitting: Adult Health

## 2013-09-15 ENCOUNTER — Ambulatory Visit (INDEPENDENT_AMBULATORY_CARE_PROVIDER_SITE_OTHER): Payer: Medicare Other | Admitting: Adult Health

## 2013-09-15 ENCOUNTER — Ambulatory Visit (INDEPENDENT_AMBULATORY_CARE_PROVIDER_SITE_OTHER)
Admission: RE | Admit: 2013-09-15 | Discharge: 2013-09-15 | Disposition: A | Discharge status: Home / Self Care | Payer: Medicare Other | Source: Ambulatory Visit | Attending: Adult Health | Admitting: Adult Health

## 2013-09-15 VITALS — BP 124/74 | HR 89 | Temp 98.0°F | Ht 61.25 in | Wt 110.2 lb

## 2013-09-15 DIAGNOSIS — J4531 Mild persistent asthma with (acute) exacerbation: Secondary | ICD-10-CM

## 2013-09-15 DIAGNOSIS — J45901 Unspecified asthma with (acute) exacerbation: Secondary | ICD-10-CM

## 2013-09-15 MED ORDER — OMALIZUMAB 150 MG ~~LOC~~ SOLR
150.0000 mg | Freq: Once | SUBCUTANEOUS | Status: AC
  Administered 2013-09-15: 150 mg via SUBCUTANEOUS

## 2013-09-15 MED ORDER — AZITHROMYCIN 250 MG PO TABS: ORAL_TABLET | ORAL | Status: AC

## 2013-09-15 MED ORDER — AZELASTINE HCL 0.1 % NA SOLN: 2.0000 | Freq: Every day | NASAL | Status: DC

## 2013-09-15 NOTE — Telephone Encounter (Signed)
7.28.15 mychart msg from pt: Message     Please add that I have had the Pneumonia vaccine in your office. It is still sowing on my chart that it is past due.    Thank you, Ebony Scott    This was taken care of at the 7.28.15 ov w/ TP Will sign off

## 2013-09-15 NOTE — Telephone Encounter (Signed)
Pt c/o pain in right side of back lung area.  Worse with deep breath.  Chest tightness.  Prod cough (yellow).  Pt given appt with TP today at 2:30

## 2013-09-15 NOTE — Patient Instructions (Addendum)
Zpack Take as directed.  Mucinex Twice daily  As needed  Congestion  Delsym 2 tsp Twice daily  As needed  Cough  Saline nasal rinses As needed   Please contact office for sooner follow up if symptoms do not improve or worsen or seek emergency care

## 2013-09-15 NOTE — Progress Notes (Signed)
Subjective:    Patient ID: Ebony Scott, female    DOB: 07/31/1950, 63 y.o.   MRN: [id number]  HPI  62 y.o.. WF atopic moderate persistent asthma, elevated IgE. Started Xolair 05/03/10  Initial Pulmonary Consultation 12/23/09   04/27/2013 Chief Complaint  Patient presents with  . Asthma    follow-up. Pt states she has not felt well for over 1 month. Pt has been on antibiotics from PCP.  Pt states that she has sores in her mouth and her tongue is white, requesting a refil on Magic Mouthwash. Pt states she has been having increased SOB, productive cough with yellow phelgm, discomfort in her back and neck, wheezing as well.   Pt ill for one month. Symptoms : sinus infxn, LN swollen, wheezing, Rx Zpak, then later pred pulse, and another Zpak. Pt not well.  Rx ear drop L ear qid.  L ear pain.   Pt cont to wheeze, still dyspnea, sores on mouth, coughing up mucus, pain into back area.  No CXR. Mucus now is yellow to clear. Blood out of nose. Notes sores on tongue. Notes fever>>100degrees.   L ear still hurts.   [facility]  05/18/2013 Chief Complaint  Patient presents with  . Asthma    Breathing is worse since last OV. Reports SOB, chest tightness, coughing and sneezing.  At last ov we rec: Clindamycin 300mg  3 times daily for 7days Increase Align to three times daily at meals while on clindamycin Diflucan 100mg  Take two once then one daily until gone Prednisone 10mg  Take 4 for two days three for two days two for two days one for two days  Pt finished ABX and prednisone.  Now is worse.  Pt saw ENT: ear was ok, was inflammed.  Rx Eye drops to the ear.  This did not help the ear pain f/u 05/26/13. neti pot makes the L ear worse.  Pt notes pain over L eye and face and L ear.   Pt notes more dyspnea .   The cleocin helped the sinuses.  Did have frequent stools on the cleocin PFR 250-275 (low)    Diflucan helped but still got candida in mouth  Needs prevnar 13  07/17/2013 Chief Complaint  Patient  presents with  . Asthma    Breathing has good days and bad days. Reports hot weather causes issues at time. Doesn't know if Xolair is working.  Good and bad days now.  To the end of April, hot water leaking in garage, had this replaced.  Humidity increased during this period. Now has dehumidifiers running.  45-50% range.   Needs a letter for this equipment.   PFR 250-275 range.   CT Sinus was done.   Pt saw [facility] and only showed mold allergies PUL ASTHMA HISTORY 07/17/2013 08/22/2011 01/31/2011 09/20/2010  Symptoms 0-2 days/week Daily 0-2 days/week Daily  Nighttime awakenings 0-2/month 0-2/month 0-2/month -  Interference with activity Minor limitations Some limitations No limitations -  SABA use 0-2 days/wk 0-2 days/wk 0-2 days/wk -  Exacerbations requiring oral steroids 0-1 / year 0-1 / year 0-1 / year -    09/15/2013 Acute OV  Complains of increased SOB, right lung discomfort x2-3 days with prod cough with yellow mucus, chills/sweats at night.  Denies any hemoptysis, wheezing, tightness, fever.  No recent travel or abx use Concerned she has PNA.  No otc used for tx.  CXR w/ no acute process noted    Review of Systems Constitutional:   No  weight  loss, night sweats,  Fevers, chills,  +fatigue, or  lassitude.  HEENT:   No headaches,  Difficulty swallowing,  Tooth/dental problems, or  Sore throat,                No sneezing, itching, ear ache,  +nasal congestion, post nasal drip,   CV:  No chest pain,  Orthopnea, PND, swelling in lower extremities, anasarca, dizziness, palpitations, syncope.   GI  No heartburn, indigestion, abdominal pain, nausea, vomiting, diarrhea, change in bowel habits, loss of appetite, bloody stools.   Resp:  ,  No coughing up of blood.   No chest wall deformity  Skin: no rash or lesions.  GU: no dysuria, change in color of urine, no urgency or frequency.  No flank pain, no hematuria   MS:  No joint pain or swelling.  No decreased range of motion.  No back  pain.  Psych:  No change in mood or affect. No depression or anxiety.  No memory loss.      Objective:   Physical Exam   BP 124/74  Pulse 89  Temp(Src) 98 F (36.7 C) (Oral)  Ht 5' 1.25" (1.556 m)  Wt 110 lb 3.2 oz (49.986 kg)  BMI 20.65 kg/m2  SpO2 96%  GEN: A/Ox3; pleasant , NAD, well nourished   HEENT:  [place]/AT,  EACs-clear, TMs-wnl, NOSE-clear drainage , THROAT, nasal purulence   NECK:  Supple w/ fair ROM; no JVD; normal carotid impulses w/o bruits; no thyromegaly or nodules palpated; no lymphadenopathy.  RESP  Clear  P & A; no accessory muscle use, no dullness to percussion  CARD:  RRR, no m/r/g  , no peripheral edema, pulses intact, no cyanosis or clubbing.  GI:   Soft & nt; nml bowel sounds; no organomegaly or masses detected.  Musco: Warm bil, no deformities or joint swelling noted.   Neuro: alert, no focal deficits noted.    Skin: Warm, no lesions or rashes  CXR 09/15/13 >No radiographic evidence of active cardiopulmonary disease.   No results found.     Assessment & Plan:   No problem-specific assessment & plan notes found for this encounter.   Updated Medication List Outpatient Encounter Prescriptions as of 09/15/2013  Medication Sig  . acetaminophen (TYLENOL ARTHRITIS PAIN) 650 MG CR tablet Take 650 mg by mouth 2 (two) times daily.   Marland Kitchen albuterol (PROAIR HFA) 108 (90 BASE) MCG/ACT inhaler Inhale 1-2 puffs into the lungs every 6 (six) hours as needed for wheezing or shortness of breath.  . Alpha-D-Galactosidase (BEANO) TABS Take 1 tablet by mouth daily.   . Alpha-Lipoic Acid 600 MG CAPS Take 600 mg by mouth daily. Once a day  . Azelastine HCl (ASTEPRO) 0.15 % SOLN Place 2 sprays into the nose daily.  . Calcium Citrate-Vitamin D (CITRACAL MAXIMUM) 315-250 MG-UNIT TABS Take 1 tablet by mouth 2 (two) times daily.   . Cholecalciferol (VITAMIN D) 2000 UNITS CAPS 1/2 at bedtime  . CREON 24000 UNITS CPEP TAKE 1 CAPSULE BY MOUTH DAILY 3-4 TIMES DAILY(WITH EACH  MEAL)  . cyproheptadine (PERIACTIN) 4 MG tablet Take 4 mg by mouth daily.   Marland Kitchen denosumab (PROLIA) 60 MG/ML SOLN injection Inject 60 mg into the skin every 6 (six) months. Administer in upper arm, thigh, or abdomen  . dexlansoprazole (DEXILANT) 60 MG capsule Take 60 mg by mouth daily.  . DULERA 100-5 MCG/ACT AERO INHALE 1 PUFF BY MOUTH TWICE DAILY  . EPINEPHrine (EPIPEN JR) 0.15 MG/0.3ML injection Inject 0.3 mLs (0.15  mg total) into the muscle as needed. For reaction  . gabapentin (NEURONTIN) 600 MG tablet Take 1 tablet (600 mg total) by mouth daily as needed.  . hydrocortisone (PROCTOZONE-HC) 2.5 % rectal cream Apply as needed as directed  . lactase (LACTAID) 3000 UNITS tablet Take 3 tablets by mouth as needed. 3-4 tabs with dairy liquids and solids  . lidocaine (LIDODERM) 5 % Place 1 patch onto the skin daily as needed. Remove & Discard patch within 12 hours or as directed by MD  . Melatonin 3 MG CAPS Take 9 mg by mouth at bedtime. 3 tabs at bedtime  . mometasone (NASONEX) 50 MCG/ACT nasal spray Place 2 sprays into the nose daily.  . montelukast (SINGULAIR) 10 MG tablet TAKE 1 TABLET BY MOUTH ONCE DAILY  . neomycin-bacitracin-polymyxin (NEOSPORIN) ointment Apply 1 application topically every 12 (twelve) hours. Use on/in nose as directed as needed  . nystatin (MYCOSTATIN) 100000 UNIT/ML suspension Take 5 mLs (500,000 Units total) by mouth 4 (four) times daily as needed.  Marland Kitchen omalizumab (XOLAIR) 150 MG injection Inject 150 mg into the skin every 28 (twenty-eight) days. 150 SQ every 4 weeks  . Polyethyl Glycol-Propyl Glycol (SYSTANE ULTRA) 0.4-0.3 % SOLN Apply 1 drop to eye 2 (two) times daily as needed. 1-2 drops each eye every morning and at bedtime  . Prenatal Vit-Sel-Fe Fum-FA (VINATE M) 27-1 MG TABS Take 1 tablet by mouth daily.  . Probiotic Product (ALIGN) 4 MG CAPS Take 1 tablet by mouth daily. Once a day  . propylthiouracil (PTU) 50 MG tablet take 1/2 tablet (25 mg) twice daily  .  pseudoephedrine-guaifenesin (MUCINEX D) 60-600 MG per tablet Take 1 tablet by mouth every 12 (twelve) hours.  . rosuvastatin (CRESTOR) 10 MG tablet TAKE 1 TABLET BY MOUTH DAILY  . Simethicone (GAS-X EXTRA STRENGTH) 125 MG CAPS Take 2 each by mouth daily as needed. For gas relief  . [DISCONTINUED] Prenatal Vit-Sel-Fe Fum-FA (VINATE M) 27-1 MG TABS Take 1 tablet by mouth 2 (two) times daily.  . [DISCONTINUED] mometasone-formoterol (DULERA) 100-5 MCG/ACT AERO Inhale 1 puff into the lungs 2 (two) times daily.

## 2013-09-18 NOTE — Assessment & Plan Note (Signed)
Mild flare with URI  No sign of PNA on cxr   Plan  Zpack Take as directed.  Mucinex Twice daily  As needed  Congestion  Delsym 2 tsp Twice daily  As needed  Cough  Saline nasal rinses As needed   Please contact office for sooner follow up if symptoms do not improve or worsen or seek emergency care

## 2013-09-21 ENCOUNTER — Telehealth: Payer: Self-pay | Admitting: Critical Care Medicine

## 2013-09-21 ENCOUNTER — Ambulatory Visit
Admission: RE | Admit: 2013-09-21 | Discharge: 2013-09-21 | Disposition: A | Discharge status: Home / Self Care | Payer: Medicare Other | Source: Ambulatory Visit

## 2013-09-21 DIAGNOSIS — Z1231 Encounter for screening mammogram for malignant neoplasm of breast: Secondary | ICD-10-CM

## 2013-09-21 MED ORDER — FLUCONAZOLE 100 MG PO TABS: ORAL_TABLET | ORAL | Status: DC

## 2013-09-21 MED ORDER — DOXYCYCLINE HYCLATE 100 MG PO TABS: 100.0000 mg | ORAL_TABLET | Freq: Two times a day (BID) | ORAL | Status: DC

## 2013-09-21 MED ORDER — PREDNISONE 10 MG PO TABS: ORAL_TABLET | ORAL | Status: DC

## 2013-09-21 NOTE — Telephone Encounter (Signed)
Call in doxycycline 100mg  bid x 7days Call in prednisone 10mg  Take 4 for two days three for two days two for two days one for two days #20

## 2013-09-21 NOTE — Telephone Encounter (Signed)
Please advise regarding diflucan rx- is it 100 mg or 150 mg? Also quantity? As pt has been on both in the past. Thanks Dr. Joya Gaskins

## 2013-09-21 NOTE — Telephone Encounter (Signed)
Pt is also schd for a xolair on 8/21. Pt would like to know if she can get her flu shot that day as well.  Please call back on. [phone]

## 2013-09-21 NOTE — Telephone Encounter (Signed)
Yes to both.

## 2013-09-21 NOTE — Telephone Encounter (Signed)
All requested meds have been sent to pt pharmacy Doxycycline 100mg  bid x 7days #14 Prednisone 10mg  Take 4 x 2 days, then 3 x 2, then 2 x 2, then 1 x 2 then stop #20 Diflucan 100mg  Take 2 today, then 1 daily until complete #7  Pt aware that Rx's sent to [facility]. Pt aware also that Dr Joya Gaskins okay'd her to have flu vaccine with Xolair.  Nothing further needed.

## 2013-09-21 NOTE — Telephone Encounter (Signed)
Called made pt aware of recs. She reports since this is her second ABX being called in she wants RX called in for yeast as well. Please advise thanks

## 2013-09-21 NOTE — Telephone Encounter (Signed)
Per OV w/ TP 09/15/13; Patient Instructions      Zpack Take as directed.   Mucinex Twice daily  As needed  Congestion   Delsym 2 tsp Twice daily  As needed  Cough   Saline nasal rinses As needed    Please contact office for sooner follow up if symptoms do not improve or worsen or seek emergency ca  ---  Called spoke with pt. Pt finished ZPAK Saturday. Pt reports when she still takes a deep breathe she has pain right lung into her back side. Still c/o chest tx, slight prod cough w/ clear phlem. Pt wants to know what to do since she is still having the pain. Pt did have CXR done same day and did not show PNA. Please advise Dr. Joya Gaskins thanks  Allergies  Allergen Reactions  . Dust Mite Extract Shortness Of Breath and Other (See Comments)    "sneezing" (02/20/2012)  . Molds & Smuts Shortness Of Breath  . Morphine Sulfate Itching  . Other Shortness Of Breath and Other (See Comments)    Grass and weeds "sneezing; filled sinuses" (02/20/2012)  . Penicillins Rash and Other (See Comments)    "welts" (02/20/2012)  . Reclast [Zoledronic Acid]     Put in hospital  . Rofecoxib Swelling    Celebrex REACTION: feet swelling  . Shrimp Flavor Anaphylaxis    ALL SHELLFISH  . Tetracycline Hcl Nausea And Vomiting  . Dilaudid [Hydromorphone Hcl] Itching  . Hydrocodone-Acetaminophen Nausea And Vomiting  . Levofloxacin Other (See Comments)    REACTION: GI upset  . Oxycodone Hcl Nausea And Vomiting  . Paroxetine Nausea And Vomiting    Paxil   . Soy Allergy Other (See Comments)    Limited soy d/t "ESTROGEN POSITIVE" cancer  . Tree Extract Other (See Comments)    "tested and told I was allergic to it; never experienced a reaction to it" (02/20/2012)

## 2013-09-21 NOTE — Telephone Encounter (Signed)
100mg  diflucan

## 2013-09-22 NOTE — Telephone Encounter (Signed)
[employer] planner completed for Nasonex.  No additional rx needed as part of form is the nasonex rx for this program. Application and form signed by PW.   I have mailed this to the address on Form per pt's request - this program does not accept faxed applications. Copy of allergy list and med list mailed with application. Pt aware and voiced no further questions or concerns at this time.

## 2013-09-29 ENCOUNTER — Ambulatory Visit: Payer: Medicare Other | Admitting: Family Medicine

## 2013-09-30 ENCOUNTER — Telehealth: Payer: Self-pay | Admitting: Critical Care Medicine

## 2013-09-30 MED ORDER — NYSTATIN 100000 UNIT/ML MT SUSP: 5.0000 mL | Freq: Four times a day (QID) | OROMUCOSAL | Status: DC | PRN

## 2013-09-30 MED ORDER — FLUCONAZOLE 100 MG PO TABS: ORAL_TABLET | ORAL | Status: DC

## 2013-09-30 NOTE — Telephone Encounter (Signed)
Per 09/21/13; Diflucan 100mg  Take 2 today, then 1 daily until complete #7 --  Called spoke w/ pt. She reports she needs a refill nystatin and diflucan. C/o yeast in left ear (eustacian tube), back of throat, and vaginal area. She reports she has pain in her upper torso which she reports is associated when she has fungus/yeast. She reports she always gets this when she finishes abx's. Pt was given doxy 09/21/13. PW on vacation. Please advise TP thanks  [facility]  Allergies  Allergen Reactions  . Dust Mite Extract Shortness Of Breath and Other (See Comments)    "sneezing" (02/20/2012)  . Molds & Smuts Shortness Of Breath  . Morphine Sulfate Itching  . Other Shortness Of Breath and Other (See Comments)    Grass and weeds "sneezing; filled sinuses" (02/20/2012)  . Penicillins Rash and Other (See Comments)    "welts" (02/20/2012)  . Reclast [Zoledronic Acid]     Put in hospital  . Rofecoxib Swelling    Celebrex REACTION: feet swelling  . Shrimp Flavor Anaphylaxis    ALL SHELLFISH  . Tetracycline Hcl Nausea And Vomiting  . Dilaudid [Hydromorphone Hcl] Itching  . Hydrocodone-Acetaminophen Nausea And Vomiting  . Levofloxacin Other (See Comments)    REACTION: GI upset  . Oxycodone Hcl Nausea And Vomiting  . Paroxetine Nausea And Vomiting    Paxil   . Soy Allergy Other (See Comments)    Limited soy d/t "ESTROGEN POSITIVE" cancer  . Tree Extract Other (See Comments)    "tested and told I was allergic to it; never experienced a reaction to it" (02/20/2012)

## 2013-09-30 NOTE — Telephone Encounter (Signed)
Per TP: okay to refill the nystatin suspension and diflucan as last filled by PW, no refills on either.  If symptoms do not improve, will need to follow up with PCP.  Thanks.

## 2013-09-30 NOTE — Telephone Encounter (Signed)
Pt aware that refills of Nystatin and Diflucan 100mg  have been sent to [facility].  Aware of rec's per TP. Nothing further needed.

## 2013-10-01 ENCOUNTER — Telehealth: Payer: Self-pay | Admitting: *Deleted

## 2013-10-01 NOTE — Telephone Encounter (Signed)
Pt called wanting to know if she can get referral to Ebony Scott for pain mgmt for the chronic pain in spine. Would you like to see her first to discuss? Or proceed with referral?

## 2013-10-01 NOTE — Telephone Encounter (Signed)
i'd like to see her to discuss first.

## 2013-10-07 ENCOUNTER — Other Ambulatory Visit (HOSPITAL_COMMUNITY): Payer: Self-pay | Admitting: Allergy and Immunology

## 2013-10-07 DIAGNOSIS — R1310 Dysphagia, unspecified: Secondary | ICD-10-CM

## 2013-10-08 ENCOUNTER — Ambulatory Visit (HOSPITAL_COMMUNITY)
Admission: RE | Admit: 2013-10-08 | Discharge: 2013-10-08 | Disposition: A | Discharge status: Home / Self Care | Payer: Medicare Other | Source: Ambulatory Visit | Attending: Allergy and Immunology | Admitting: Allergy and Immunology

## 2013-10-08 DIAGNOSIS — Z9889 Other specified postprocedural states: Secondary | ICD-10-CM | POA: Diagnosis not present

## 2013-10-08 DIAGNOSIS — R1313 Dysphagia, pharyngeal phase: Secondary | ICD-10-CM | POA: Diagnosis not present

## 2013-10-08 DIAGNOSIS — K219 Gastro-esophageal reflux disease without esophagitis: Secondary | ICD-10-CM | POA: Diagnosis not present

## 2013-10-08 DIAGNOSIS — J45909 Unspecified asthma, uncomplicated: Secondary | ICD-10-CM | POA: Diagnosis not present

## 2013-10-08 DIAGNOSIS — R1310 Dysphagia, unspecified: Secondary | ICD-10-CM | POA: Diagnosis present

## 2013-10-09 ENCOUNTER — Ambulatory Visit (INDEPENDENT_AMBULATORY_CARE_PROVIDER_SITE_OTHER): Payer: Medicare Other | Admitting: Family Medicine

## 2013-10-09 ENCOUNTER — Encounter: Payer: Self-pay | Admitting: Family Medicine

## 2013-10-09 ENCOUNTER — Ambulatory Visit
Admission: RE | Admit: 2013-10-09 | Discharge: 2013-10-09 | Disposition: A | Discharge status: Home / Self Care | Payer: Medicare Other | Source: Ambulatory Visit | Attending: Family Medicine | Admitting: Family Medicine

## 2013-10-09 ENCOUNTER — Ambulatory Visit (INDEPENDENT_AMBULATORY_CARE_PROVIDER_SITE_OTHER): Payer: Medicare Other

## 2013-10-09 VITALS — BP 130/68 | HR 78 | Temp 97.9°F | Resp 16 | Ht 62.0 in | Wt 112.0 lb

## 2013-10-09 DIAGNOSIS — M797 Fibromyalgia: Secondary | ICD-10-CM

## 2013-10-09 DIAGNOSIS — IMO0001 Reserved for inherently not codable concepts without codable children: Secondary | ICD-10-CM

## 2013-10-09 DIAGNOSIS — M545 Low back pain, unspecified: Secondary | ICD-10-CM

## 2013-10-09 DIAGNOSIS — J45909 Unspecified asthma, uncomplicated: Secondary | ICD-10-CM

## 2013-10-09 DIAGNOSIS — M542 Cervicalgia: Secondary | ICD-10-CM

## 2013-10-09 MED ORDER — OMALIZUMAB 150 MG ~~LOC~~ SOLR
150.0000 mg | Freq: Once | SUBCUTANEOUS | Status: AC
  Administered 2013-10-09: 150 mg via SUBCUTANEOUS

## 2013-10-09 NOTE — Progress Notes (Signed)
Subjective:    Patient ID: Ebony Scott, female    DOB: 1950/12/14, 63 y.o.   MRN: [id number]  HPI Patient has a history of chronic neck pain. She has a history of a fusion at C3-C4 as well as a discectomy at C5-C6.  At that time she sought assistance with pain management in a pain clinic where she received epidural steroid injections. Patient has been relatively well controlled for years on lidoderm patches.  Recently her insurance refuse Lidoderm patches. Ever since that time the patient reports continued chronic constant neck pain. She's also complaining of chronic pain in her low back. The pain does not radiate into her arms or legs. She has no symptoms of cauda equina syndrome. She has no symptoms of weakness in her arms or her legs. The pain is constant dull. She also complains of pains all over her body. She does have a history of fibromyalgia.  However the pain did not dramatically return until she was no longer able to give her Lidoderm patches.  Past Medical History  Diagnosis Date  . Chronic neck pain   . Allergic rhinitis   . Moderate persistent asthma     -FeV1 72% 2011, -IgE 102 2011, CT sinus Neg 2011  . GERD (gastroesophageal reflux disease)   . Hypothyroidism   . Osteoporosis     on reclast yearly  . Hiatal hernia   . Diverticulosis   . Hemorrhoids   . Asthma   . IBS (irritable bowel syndrome)   . Complication of anesthesia     "had hard time waking up from it several times" (02/20/2012)  . Hypercholesteremia   . Pneumonia 04/2011; ~ 11/2011    "double; single" (02/20/2012)  . Exertional dyspnea   . Graves disease   . Headache(784.0)     "related to allergies; more at different times during the year" (02/20/2012)  . Arthritis     "mostly the hands" (02/20/2012)  . Fibromyalgia 11/2011  . Chronic lower back pain   . Depression     "some; don't take anything for it" (02/20/2012)  . Breast cancer 1998    in remission  . Anxiety   . DVT (deep venous thrombosis)    Past  Surgical History  Procedure Laterality Date  . Cervical fusion  2003    C3-C4  . Cervical discectomy  10/2001    C5-C6  . Vesicovaginal fistula closure w/ tah  1988  . Nasal septum surgery  1980's  . Anterior and posterior repair  1990's  . Breast lumpectomy  1998    left  . Debridement tennis elbow  ?1970's    right  . Knee arthroplasty  ?1990's    "?right; w/cartilage repair" (02/20/2012)  . Carpometacarpel (cm[facility]) fusion of thumb with autograft from radius  ~ 2009    "both thumbs" (02/20/2012)  . Cataract extraction w/ intraocular lens  implant, bilateral  2012  . Tonsillectomy  ~ 1953  . Posterior cervical fusion/foraminotomy  2004    "failed initial fusion; rewired  anterior neck" (02/20/2012)   Current Outpatient Prescriptions on File Prior to Visit  Medication Sig Dispense Refill  . acetaminophen (TYLENOL ARTHRITIS PAIN) 650 MG CR tablet Take 650 mg by mouth 2 (two) times daily.       Marland Kitchen albuterol (PROAIR HFA) 108 (90 BASE) MCG/ACT inhaler Inhale 1-2 puffs into the lungs every 6 (six) hours as needed for wheezing or shortness of breath.  1 Inhaler  3  . Alpha-D-Galactosidase (BEANO)  TABS Take 1 tablet by mouth daily.       . Alpha-Lipoic Acid 600 MG CAPS Take 600 mg by mouth daily. Once a day      . azelastine (ASTELIN) 0.1 % nasal spray Place 2 sprays into both nostrils daily. Use in each nostril as directed  30 mL  6  . Calcium Citrate-Vitamin D (CITRACAL MAXIMUM) 315-250 MG-UNIT TABS Take 1 tablet by mouth 2 (two) times daily.       . Cholecalciferol (VITAMIN D) 2000 UNITS CAPS 1/2 at bedtime      . CREON 24000 UNITS CPEP TAKE 1 CAPSULE BY MOUTH DAILY 3-4 TIMES DAILY(WITH EACH MEAL)  180 each  11  . cyproheptadine (PERIACTIN) 4 MG tablet Take 4 mg by mouth daily.       Marland Kitchen denosumab (PROLIA) 60 MG/ML SOLN injection Inject 60 mg into the skin every 6 (six) months. Administer in upper arm, thigh, or abdomen      . dexlansoprazole (DEXILANT) 60 MG capsule Take 60 mg by mouth daily.        . DULERA 100-5 MCG/ACT AERO INHALE 1 PUFF BY MOUTH TWICE DAILY  13 g  5  . EPINEPHrine (EPIPEN JR) 0.15 MG/0.3ML injection Inject 0.3 mLs (0.15 mg total) into the muscle as needed. For reaction  1 each  4  . fluconazole (DIFLUCAN) 100 MG tablet Take 2 tabs today, then 1 daily until complete  7 tablet  0  . fluconazole (DIFLUCAN) 100 MG tablet Take 2 today and then 1 daily until complete.  7 tablet  0  . gabapentin (NEURONTIN) 600 MG tablet Take 1 tablet (600 mg total) by mouth daily as needed.  90 tablet  3  . hydrocortisone (PROCTOZONE-HC) 2.5 % rectal cream Apply as needed as directed  30 g  11  . lactase (LACTAID) 3000 UNITS tablet Take 3 tablets by mouth as needed. 3-4 tabs with dairy liquids and solids      . lidocaine (LIDODERM) 5 % Place 1 patch onto the skin daily as needed. Remove & Discard patch within 12 hours or as directed by MD  30 patch  11  . Melatonin 3 MG CAPS Take 9 mg by mouth at bedtime. 3 tabs at bedtime      . mometasone (NASONEX) 50 MCG/ACT nasal spray Place 2 sprays into the nose daily.  17 g  11  . montelukast (SINGULAIR) 10 MG tablet TAKE 1 TABLET BY MOUTH ONCE DAILY  30 tablet  11  . neomycin-bacitracin-polymyxin (NEOSPORIN) ointment Apply 1 application topically every 12 (twelve) hours. Use on/in nose as directed as needed      . nystatin (MYCOSTATIN) 100000 UNIT/ML suspension Take 5 mLs (500,000 Units total) by mouth 4 (four) times daily as needed.  180 mL  0  . omalizumab (XOLAIR) 150 MG injection Inject 150 mg into the skin every 28 (twenty-eight) days. 150 SQ every 4 weeks      . Polyethyl Glycol-Propyl Glycol (SYSTANE ULTRA) 0.4-0.3 % SOLN Apply 1 drop to eye 2 (two) times daily as needed. 1-2 drops each eye every morning and at bedtime      . Prenatal Vit-Sel-Fe Fum-FA (VINATE M) 27-1 MG TABS Take 1 tablet by mouth daily.      . Probiotic Product (ALIGN) 4 MG CAPS Take 1 tablet by mouth daily. Once a day      . propylthiouracil (PTU) 50 MG tablet take 1/2 tablet  (25 mg) twice daily      .  pseudoephedrine-guaifenesin (MUCINEX D) 60-600 MG per tablet Take 1 tablet by mouth every 12 (twelve) hours.  60 tablet  3  . rosuvastatin (CRESTOR) 10 MG tablet TAKE 1 TABLET BY MOUTH DAILY  30 tablet  11  . Simethicone (GAS-X EXTRA STRENGTH) 125 MG CAPS Take 2 each by mouth daily as needed. For gas relief      . [DISCONTINUED] methimazole (TAPAZOLE) 5 MG tablet Take 5 mg by mouth 3 (three) times daily.         No current facility-administered medications on file prior to visit.   Allergies  Allergen Reactions  . Dust Mite Extract Shortness Of Breath and Other (See Comments)    "sneezing" (02/20/2012)  . Molds & Smuts Shortness Of Breath  . Morphine Sulfate Itching  . Other Shortness Of Breath and Other (See Comments)    Grass and weeds "sneezing; filled sinuses" (02/20/2012)  . Penicillins Rash and Other (See Comments)    "welts" (02/20/2012)  . Reclast [Zoledronic Acid]     Put in hospital  . Rofecoxib Swelling    Celebrex REACTION: feet swelling  . Shrimp Flavor Anaphylaxis    ALL SHELLFISH  . Tetracycline Hcl Nausea And Vomiting  . Dilaudid [Hydromorphone Hcl] Itching  . Hydrocodone-Acetaminophen Nausea And Vomiting  . Levofloxacin Other (See Comments)    REACTION: GI upset  . Oxycodone Hcl Nausea And Vomiting  . Paroxetine Nausea And Vomiting    Paxil   . Soy Allergy Other (See Comments)    Limited soy d/t "ESTROGEN POSITIVE" cancer  . Tree Extract Other (See Comments)    "tested and told I was allergic to it; never experienced a reaction to it" (02/20/2012)   History   Social History  . Marital Status: Divorced    Spouse Name: N/A    Number of Children: 2  . Years of Education: college   Occupational History  . Disabled     Retired Print production planner  .     Social History Main Topics  . Smoking status: Never Smoker   . Smokeless tobacco: Never Used  . Alcohol Use: 0.0 oz/week     Comment: 02/20/2012 "couple glasses of wine/6  months"  . Drug Use: No  . Sexual Activity: No   Other Topics Concern  . Not on file   Social History Narrative   Patient lives at home alone. Patient  divorced.    Patient has her BS degree.   Right handed.   Caffeine- sometimes coffee.               Review of Systems  All other systems reviewed and are negative.      Objective:   Physical Exam  Vitals reviewed. Cardiovascular: Normal rate, regular rhythm and normal heart sounds.   Pulmonary/Chest: Effort normal and breath sounds normal. No respiratory distress. She has no wheezes. She has no rales.  Musculoskeletal:       Cervical back: She exhibits decreased range of motion, tenderness and pain. She exhibits no bony tenderness.       Lumbar back: She exhibits decreased range of motion, tenderness and pain. She exhibits no bony tenderness.          Assessment & Plan:  Fibromyalgia - Plan: DG Cervical Spine Complete, DG Lumbar Spine Complete  Neck pain  Midline low back pain without sciatica  I really believe the patient's pain is more likely fibromyalgia given the fact that only recently worsened after she discontinued Lidoderm. I do  not believe that the Lidoderm to be successful in treating any severe cervical degenerative disc disease. Furthermore would not explain the pain worsened in her lower back primary issue she had when her upper back. Therefore I recommended x-rays of the cervical spine and lumbar spine. Hip x-ray showed a definite areas of where spondylosis and degenerative disease could be causing pain I would be glad to refer the patient to pain clinic for possible steroid injections. However, his x-rays are relatively benign, I would consider Lyrica or Cymbalta for management of her chronic pain.  We will switch the patient from gabapentin to Lyrica.  Further plan depending upon the results of her xrays.

## 2013-10-12 ENCOUNTER — Other Ambulatory Visit: Payer: Self-pay | Admitting: *Deleted

## 2013-10-12 ENCOUNTER — Ambulatory Visit
Admission: RE | Admit: 2013-10-12 | Discharge: 2013-10-12 | Disposition: A | Discharge status: Home / Self Care | Payer: Medicare Other | Source: Ambulatory Visit | Attending: Family Medicine | Admitting: Family Medicine

## 2013-10-12 DIAGNOSIS — M545 Low back pain, unspecified: Secondary | ICD-10-CM

## 2013-10-12 NOTE — Procedures (Signed)
Objective Swallowing Evaluation: Modified Barium Swallowing Study  Patient Details  Name: Ebony Scott MRN: [id number] Date of Birth: 1950-12-25  Today's Date: 10/08/2013 Time: 1200-1230 SLP Time Calculation (min): 30 min  Past Medical History:  Past Medical History  Diagnosis Date  . Chronic neck pain   . Allergic rhinitis   . Moderate persistent asthma     -FeV1 72% 2011, -IgE 102 2011, CT sinus Neg 2011  . GERD (gastroesophageal reflux disease)   . Hypothyroidism   . Osteoporosis     on reclast yearly  . Hiatal hernia   . Diverticulosis   . Hemorrhoids   . Asthma   . IBS (irritable bowel syndrome)   . Complication of anesthesia     "had hard time waking up from it several times" (02/20/2012)  . Hypercholesteremia   . Pneumonia 04/2011; ~ 11/2011    "double; single" (02/20/2012)  . Exertional dyspnea   . Graves disease   . Headache(784.0)     "related to allergies; more at different times during the year" (02/20/2012)  . Arthritis     "mostly the hands" (02/20/2012)  . Fibromyalgia 11/2011  . Chronic lower back pain   . Depression     "some; don't take anything for it" (02/20/2012)  . Breast cancer 1998    in remission  . Anxiety   . DVT (deep venous thrombosis)    Past Surgical History:  Past Surgical History  Procedure Laterality Date  . Cervical fusion  2003    C3-C4  . Cervical discectomy  10/2001    C5-C6  . Vesicovaginal fistula closure w/ tah  1988  . Nasal septum surgery  1980's  . Anterior and posterior repair  1990's  . Breast lumpectomy  1998    left  . Debridement tennis elbow  ?1970's    right  . Knee arthroplasty  ?1990's    "?right; w/cartilage repair" (02/20/2012)  . Carpometacarpel (cmc) fusion of thumb with autograft from radius  ~ 2009    "both thumbs" (02/20/2012)  . Cataract extraction w/ intraocular lens  implant, bilateral  2012  . Tonsillectomy  ~ 1953  . Posterior cervical fusion/foraminotomy  2004    "failed initial fusion; rewired  anterior  neck" (02/20/2012)   HPI:        Assessment / Plan / Recommendation Clinical Impression  Dysphagia Diagnosis: Mild pharyngeal phase dysphagia Clinical impression: Pt demonstrates a mild structural oropharyngeal dysphagia resulting from presence of cervical hardware narrowing pharyngeal cavity and impeding epiglottic deflection, In 25% of trials epiglottic does not pass contact with the posterior pharyngeal wall, making airway closure slightly incomplete resulting in very trace silent penetration to the cords of thin liquids. Penetrates clear with subsequent swallows. There were no significant weakness or residuals post swallow. Oral and velar function WNL. No obvious cause for pt report of nasal regurgitation of solids. Did discuss avoiding dry, grainy textures, consuming soft moist solids, following solids with liquids to aid in transit of bolus and following pills with a bite of puree or pudding texture to aid in esophageal clearance. Overall, risk of significant aspiration is low and pt is recommended to continue consuming thin liquids and solids of her choice.     Treatment Recommendation  No treatment recommended at this time    Diet Recommendation Regular;Thin liquid   Liquid Administration via: Cup Medication Administration: Whole meds with liquid (follow iwth a bite of puree) Supervision: Patient able to self feed Compensations: Small sips/bites;Follow solids with  liquid Postural Changes and/or Swallow Maneuvers: Seated upright 90 degrees    Other  Recommendations Oral Care Recommendations: Patient independent with oral care   Follow Up Recommendations  None    Frequency and Duration        Pertinent Vitals/Pain NA    SLP Swallow Goals     General Type of Study: Modified Barium Swallowing Study Reason for Referral: Objectively evaluate swallowing function Diet Prior to this Study: Regular Temperature Spikes Noted: N/A Respiratory Status: Room air History of Recent  Intubation: No Behavior/Cognition: Alert;Cooperative;Pleasant mood Oral Cavity - Dentition: Adequate natural dentition Oral Motor / Sensory Function: Within functional limits Self-Feeding Abilities: Able to feed self Patient Positioning: Upright in chair Baseline Vocal Quality: Clear Volitional Cough: Strong Volitional Swallow: Able to elicit Anatomy:  (Cervical hardwre at C3 -C4) Pharyngeal Secretions: Not observed secondary MBS    Reason for Referral Objectively evaluate swallowing function   Oral Phase Oral Preparation/Oral Phase Oral Phase: WFL   Pharyngeal Phase Pharyngeal Phase Pharyngeal Phase: Impaired Pharyngeal - Thin Pharyngeal - Thin Cup: Reduced epiglottic inversion;Reduced airway/laryngeal closure;Penetration/Aspiration during swallow Penetration/Aspiration details (thin cup): Material enters airway, CONTACTS cords and not ejected out;Material does not enter airway Pharyngeal - Thin Straw: Reduced epiglottic inversion;Reduced airway/laryngeal closure;Penetration/Aspiration during swallow Penetration/Aspiration details (thin straw): Material enters airway, CONTACTS cords and not ejected out;Material does not enter airway Pharyngeal - Solids Pharyngeal - Puree: Within functional limits Pharyngeal - Regular: Within functional limits Pharyngeal - Pill: Within functional limits  Cervical Esophageal Phase    GO    Cervical Esophageal Phase Cervical Esophageal Phase: Impaired Cervical Esophageal Phase - Comment Cervical Esophageal Comment: Esophageal sweep showed pill lodged mid esophagus, transited with bite of puree, but not sips of liquids. No radiologist present to confirm.     Functional Assessment Tool Used: clinical judgement Functional Limitations: Swallowing Swallow Current Status (T9030): At least 1 percent but less than 20 percent impaired, limited or restricted Swallow Goal Status [phone]): At least 1 percent but less than 20 percent impaired, limited or  restricted Swallow Discharge Status [phone]): At least 1 percent but less than 20 percent impaired, limited or restricted    Meliah Appleman, Julina Altmann 10/12/2013, 7:52 AM

## 2013-10-13 ENCOUNTER — Other Ambulatory Visit: Payer: Self-pay | Admitting: Family Medicine

## 2013-10-13 ENCOUNTER — Encounter: Payer: Self-pay | Admitting: Family Medicine

## 2013-10-13 MED ORDER — PREGABALIN 75 MG PO CAPS: 75.0000 mg | ORAL_CAPSULE | Freq: Three times a day (TID) | ORAL | Status: DC

## 2013-10-14 ENCOUNTER — Telehealth: Payer: Self-pay | Admitting: Family Medicine

## 2013-10-14 NOTE — Telephone Encounter (Signed)
Called and spoke to pt and informed her that WTP would be in in the morning and she should get a reply back from him then

## 2013-10-14 NOTE — Telephone Encounter (Signed)
Patient left dr pickard an email about maybe having a kidney stone. She says she is in continuous pain. Would like a call back from you . [phone]

## 2013-10-27 ENCOUNTER — Telehealth: Payer: Self-pay | Admitting: Family Medicine

## 2013-10-27 DIAGNOSIS — M542 Cervicalgia: Principal | ICD-10-CM

## 2013-10-27 DIAGNOSIS — G8929 Other chronic pain: Secondary | ICD-10-CM

## 2013-10-27 NOTE — Telephone Encounter (Signed)
Patient would like referral to dr mark phillips she says the lyrica is not working  Please call her back at [phone]

## 2013-10-27 NOTE — Telephone Encounter (Signed)
OK to do-

## 2013-10-27 NOTE — Telephone Encounter (Signed)
ok 

## 2013-10-27 NOTE — Telephone Encounter (Signed)
Referral placed.

## 2013-10-29 ENCOUNTER — Telehealth: Payer: Self-pay | Admitting: Family Medicine

## 2013-10-29 NOTE — Telephone Encounter (Signed)
Patient is calling to check on her referral to dr mark phillips for pain management  [phone]

## 2013-10-29 NOTE — Telephone Encounter (Signed)
Called pt back and she is aware that referral has been placed to [facility] on the 10/27/13 and she should be hearing from them about appt.Pt is going to call herself and see if she can make appt

## 2013-11-02 ENCOUNTER — Telehealth: Payer: Self-pay | Admitting: Family Medicine

## 2013-11-02 NOTE — Telephone Encounter (Signed)
I am not sure what they would be referring to other than her blood sugar.

## 2013-11-02 NOTE — Telephone Encounter (Signed)
She was not sure either and discussed several possibilities and pt will make an appt for further evaluation.

## 2013-11-02 NOTE — Telephone Encounter (Signed)
Patient is calling about recurrent yeast infections, and she talked to her pharmacy, her pharmacy told her to call us and ask about some certain bloodwork she may or may not have had here  [phone]

## 2013-11-06 ENCOUNTER — Ambulatory Visit: Payer: Medicare Other

## 2013-11-06 ENCOUNTER — Encounter: Payer: Self-pay | Admitting: Critical Care Medicine

## 2013-11-06 ENCOUNTER — Ambulatory Visit (INDEPENDENT_AMBULATORY_CARE_PROVIDER_SITE_OTHER): Payer: Medicare Other | Admitting: Critical Care Medicine

## 2013-11-06 VITALS — BP 126/74 | HR 70 | Temp 97.0°F | Ht 61.25 in | Wt 114.0 lb

## 2013-11-06 DIAGNOSIS — J4531 Mild persistent asthma with (acute) exacerbation: Secondary | ICD-10-CM

## 2013-11-06 DIAGNOSIS — J45901 Unspecified asthma with (acute) exacerbation: Secondary | ICD-10-CM

## 2013-11-06 DIAGNOSIS — Z23 Encounter for immunization: Secondary | ICD-10-CM

## 2013-11-06 MED ORDER — NYSTATIN 100000 UNIT/ML MT SUSP: OROMUCOSAL | Status: DC

## 2013-11-06 MED ORDER — MOMETASONE FUROATE 50 MCG/ACT NA SUSP: 2.0000 | Freq: Two times a day (BID) | NASAL | Status: DC

## 2013-11-06 MED ORDER — METHYLPREDNISOLONE ACETATE 80 MG/ML IJ SUSP
80.0000 mg | Freq: Once | INTRAMUSCULAR | Status: AC
  Administered 2013-11-06: 80 mg via INTRAMUSCULAR

## 2013-11-06 NOTE — Patient Instructions (Signed)
Depomedrol 80mg  IM given Flu shot given Increase nasonex two puff twice daily No other medication changes Continue to rinse sinuses twice daily Wear mask outside Return 2 months

## 2013-11-06 NOTE — Progress Notes (Signed)
Subjective:    Patient ID: Ebony Scott, female    DOB: 1950/08/07, 63 y.o.   MRN: [id number]  HPI  63 y.o.. WF atopic moderate persistent asthma, elevated IgE. Started Xolair 05/03/10  Initial Pulmonary Consultation 12/23/09    PUL ASTHMA HISTORY 11/07/2013 07/17/2013 08/22/2011 01/31/2011 09/20/2010  Symptoms Throughout the day 0-2 days/week Daily 0-2 days/week Daily  Nighttime awakenings 3-4/month 0-2/month 0-2/month 0-2/month -  Interference with activity Some limitations Minor limitations Some limitations No limitations -  SABA use Daily 0-2 days/wk 0-2 days/wk 0-2 days/wk -  Exacerbations requiring oral steroids 2 or more / year 0-1 / year 0-1 / year 0-1 / year -   11/06/2013 Chief Complaint  Patient presents with  . Follow-up    Sinus pressure, HA, hoarseness, increased SOB, chest  and fatigue x 1-2 wks.    Rx ABX 08/2013 and neg CXR.  Pt did better, but now pressure in face, forehead, more labored breathing and more fatigued, not sleeping. Notes hoarsness.  Now for 1-2 weeks.  No nasal discharge, some min yellow mucus. Notes some R pain but not as bad as before.   PFR 250 straightline   Review of Systems Constitutional:   No  weight loss, night sweats,  Fevers, chills,  ++fatigue, or  lassitude.  HEENT:   No headaches,  Difficulty swallowing,  Tooth/dental problems, or  Sore throat,                +++ sneezing, itching, ear ache,  ++nasal congestion, +++post nasal drip,   CV:  No chest pain,  Orthopnea, PND, swelling in lower extremities, anasarca, dizziness, palpitations, syncope.   GI  No heartburn, indigestion, abdominal pain, nausea, vomiting, diarrhea, change in bowel habits, loss of appetite, bloody stools.   Resp:  ,  No coughing up of blood.   No chest wall deformity  Skin: no rash or lesions.  GU: no dysuria, change in color of urine, no urgency or frequency.  No flank pain, no hematuria   MS:  No joint pain or swelling.  No decreased range of motion.  No back  pain.  Psych:  No change in mood or affect. No depression or anxiety.  No memory loss.      Objective:   Physical Exam   BP 126/74  Pulse 70  Temp(Src) 97 F (36.1 C) (Oral)  Ht 5' 1.25" (1.556 m)  Wt 114 lb (51.71 kg)  BMI 21.36 kg/m2  SpO2 99%  GEN: A/Ox3; pleasant , NAD, well nourished   HEENT:  [employer]/AT,  EACs-clear, TMs-wnl, NOSE-clear drainage , THROAT, nasal purulence   NECK:  Supple w/ fair ROM; no JVD; normal carotid impulses w/o bruits; no thyromegaly or nodules palpated; no lymphadenopathy.  RESP  Clear  P & A; no accessory muscle use, no dullness to percussion  CARD:  RRR, no m/r/g  , no peripheral edema, pulses intact, no cyanosis or clubbing.  GI:   Soft & nt; nml bowel sounds; no organomegaly or masses detected.  Musco: Warm bil, no deformities or joint swelling noted.   Neuro: alert, no focal deficits noted.    Skin: Warm, no lesions or rashes  CXR 09/15/13 >No radiographic evidence of active cardiopulmonary disease.   No results found.     Assessment & Plan:   Severe persistent asthma with significant atopic features Severe persistent asthma with significant atopic features Now with mild exacerbation secondary to allergic trigger Plan Depomedrol 80mg  IM given Flu shot given Increase nasonex  two puff twice daily No other medication changes Continue to rinse sinuses twice daily Wear mask outside Return 2 months     Updated Medication List Outpatient Encounter Prescriptions as of 11/06/2013  Medication Sig  . acetaminophen (TYLENOL ARTHRITIS PAIN) 650 MG CR tablet Take 650 mg by mouth 2 (two) times daily.   Marland Kitchen albuterol (PROAIR HFA) 108 (90 BASE) MCG/ACT inhaler Inhale 1-2 puffs into the lungs every 6 (six) hours as needed for wheezing or shortness of breath.  . Alpha-D-Galactosidase (BEANO) TABS Take 1 tablet by mouth daily.   . Alpha-Lipoic Acid 600 MG CAPS Take 600 mg by mouth daily. Once a day  . azelastine (ASTELIN) 0.1 % nasal spray  Place 2 sprays into both nostrils daily. Use in each nostril as directed  . Calcium Citrate-Vitamin D (CITRACAL MAXIMUM) 315-250 MG-UNIT TABS Take 1 tablet by mouth 2 (two) times daily.   . Cholecalciferol (VITAMIN D) 2000 UNITS CAPS 1/2 at bedtime  . CREON 24000 UNITS CPEP TAKE 1 CAPSULE BY MOUTH DAILY 3-4 TIMES DAILY(WITH EACH MEAL)  . cyproheptadine (PERIACTIN) 4 MG tablet Take 4 mg by mouth daily.   Marland Kitchen denosumab (PROLIA) 60 MG/ML SOLN injection Inject 60 mg into the skin every 6 (six) months. Administer in upper arm, thigh, or abdomen  . dexlansoprazole (DEXILANT) 60 MG capsule Take 60 mg by mouth daily.  . DULERA 100-5 MCG/ACT AERO INHALE 1 PUFF BY MOUTH TWICE DAILY  . EPINEPHrine (EPIPEN JR) 0.15 MG/0.3ML injection Inject 0.3 mLs (0.15 mg total) into the muscle as needed. For reaction  . gabapentin (NEURONTIN) 600 MG tablet Take 1 tablet by mouth daily.  . hydrocortisone (PROCTOZONE-HC) 2.5 % rectal cream Apply as needed as directed  . lactase (LACTAID) 3000 UNITS tablet Take 3 tablets by mouth as needed. 3-4 tabs with dairy liquids and solids  . Melatonin 3 MG CAPS Take 9 mg by mouth at bedtime. 3 tabs at bedtime  . mometasone (NASONEX) 50 MCG/ACT nasal spray Place 2 sprays into the nose 2 (two) times daily.  . montelukast (SINGULAIR) 10 MG tablet TAKE 1 TABLET BY MOUTH ONCE DAILY  . neomycin-bacitracin-polymyxin (NEOSPORIN) ointment Apply 1 application topically every 12 (twelve) hours. Use on/in nose as directed as needed  . nystatin-triamcinolone ointment (MYCOLOG) as needed.  Marland Kitchen omalizumab (XOLAIR) 150 MG injection Inject 150 mg into the skin every 28 (twenty-eight) days. 150 SQ every 4 weeks  . Polyethyl Glycol-Propyl Glycol (SYSTANE ULTRA) 0.4-0.3 % SOLN Apply 1 drop to eye 2 (two) times daily as needed. 1-2 drops each eye every morning and at bedtime  . Prenatal Vit-Sel-Fe Fum-FA (VINATE M) 27-1 MG TABS Take 1 tablet by mouth daily.  . Probiotic Product (ALIGN) 4 MG CAPS Take 1 tablet  by mouth daily. Once a day  . propylthiouracil (PTU) 50 MG tablet take 1/2 tablet (25 mg) twice daily  . pseudoephedrine-guaifenesin (MUCINEX D) 60-600 MG per tablet Take 1 tablet by mouth every 12 (twelve) hours.  . rosuvastatin (CRESTOR) 10 MG tablet TAKE 1 TABLET BY MOUTH DAILY  . Simethicone (GAS-X EXTRA STRENGTH) 125 MG CAPS Take 2 each by mouth daily as needed. For gas relief  . [DISCONTINUED] mometasone (NASONEX) 50 MCG/ACT nasal spray Place 2 sprays into the nose daily.  Marland Kitchen lidocaine (LIDODERM) 5 % Place 1 patch onto the skin daily as needed. Remove & Discard patch within 12 hours or as directed by MD  . nystatin (MYCOSTATIN) 100000 UNIT/ML suspension Swish, Gargle, and Expectorate 10 mL three times  daily  . [DISCONTINUED] fluconazole (DIFLUCAN) 100 MG tablet Take 2 tabs today, then 1 daily until complete  . [DISCONTINUED] fluconazole (DIFLUCAN) 100 MG tablet Take 2 today and then 1 daily until complete.  . [DISCONTINUED] nystatin (MYCOSTATIN) 100000 UNIT/ML suspension Take 5 mLs (500,000 Units total) by mouth 4 (four) times daily as needed.  . [DISCONTINUED] pregabalin (LYRICA) 75 MG capsule Take 1 capsule (75 mg total) by mouth 3 (three) times daily.  . [EXPIRED] methylPREDNISolone acetate (DEPO-MEDROL) injection 80 mg

## 2013-11-07 NOTE — Assessment & Plan Note (Signed)
Severe persistent asthma with significant atopic features Now with mild exacerbation secondary to allergic trigger Plan Depomedrol 80mg  IM given Flu shot given Increase nasonex two puff twice daily No other medication changes Continue to rinse sinuses twice daily Wear mask outside Return 2 months

## 2013-11-10 ENCOUNTER — Telehealth: Payer: Self-pay | Admitting: Internal Medicine

## 2013-11-10 NOTE — Telephone Encounter (Signed)
Spoke with the pt  She states that she was using her netti pot bid per PW's instructions, but this was causing ear pain and pressure and so she decreased to once per day and this has helped some  She states that she was advised a while back by ENT to not use netti pot anymore due to ear pain, but she wants to follow PW's recs  She is going to not use again until she hears back from [place]  PW- please advise, thanks!

## 2013-11-11 NOTE — Telephone Encounter (Signed)
Stop neti pot Use simple saline nasal spray two spray ea nostril tid

## 2013-11-11 NOTE — Telephone Encounter (Signed)
Called pt. Aware of recs. Nothing further needed

## 2013-11-16 ENCOUNTER — Other Ambulatory Visit: Payer: Self-pay | Admitting: Family Medicine

## 2013-11-16 MED ORDER — MONTELUKAST SODIUM 10 MG PO TABS: ORAL_TABLET | ORAL | Status: DC

## 2013-11-16 MED ORDER — PSEUDOEPHEDRINE-GUAIFENESIN ER 60-600 MG PO TB12: 1.0000 | ORAL_TABLET | Freq: Two times a day (BID) | ORAL | Status: DC

## 2013-11-16 NOTE — Telephone Encounter (Signed)
Med sent to pharm 

## 2013-11-18 ENCOUNTER — Other Ambulatory Visit: Payer: Self-pay | Admitting: Critical Care Medicine

## 2013-11-18 MED ORDER — OMALIZUMAB 150 MG ~~LOC~~ SOLR
150.0000 mg | Freq: Once | SUBCUTANEOUS | Status: AC
  Administered 2013-11-18: 150 mg via SUBCUTANEOUS

## 2013-11-18 NOTE — Addendum Note (Signed)
Addended by: Clayborne Dana C on: 11/18/2013 02:20 PM   Modules accepted: Orders

## 2013-12-04 ENCOUNTER — Ambulatory Visit (INDEPENDENT_AMBULATORY_CARE_PROVIDER_SITE_OTHER): Payer: Medicare Other

## 2013-12-04 DIAGNOSIS — J4531 Mild persistent asthma with (acute) exacerbation: Secondary | ICD-10-CM

## 2013-12-08 ENCOUNTER — Other Ambulatory Visit: Payer: Self-pay | Admitting: *Deleted

## 2013-12-08 MED ORDER — PANCRELIPASE (LIP-PROT-AMYL) 24000-76000 UNITS PO CPEP: ORAL_CAPSULE | ORAL | Status: DC

## 2013-12-08 NOTE — Telephone Encounter (Signed)
Received fax requesting refill on Creon.   Refill appropriate and filled per protocol.

## 2013-12-09 MED ORDER — OMALIZUMAB 150 MG ~~LOC~~ SOLR
150.0000 mg | Freq: Once | SUBCUTANEOUS | Status: AC
  Administered 2013-12-09: 150 mg via SUBCUTANEOUS

## 2013-12-13 IMAGING — CR DG CHEST 2V
2 series · 2 of 2 positions shown · non-contrast
Comparison: CT 12/12/2009.

CLINICAL DATA: History of asthma.  History of increasing coughing.
Remote history of breast carcinoma.

CHEST - 2 VIEW

[w chest pa]
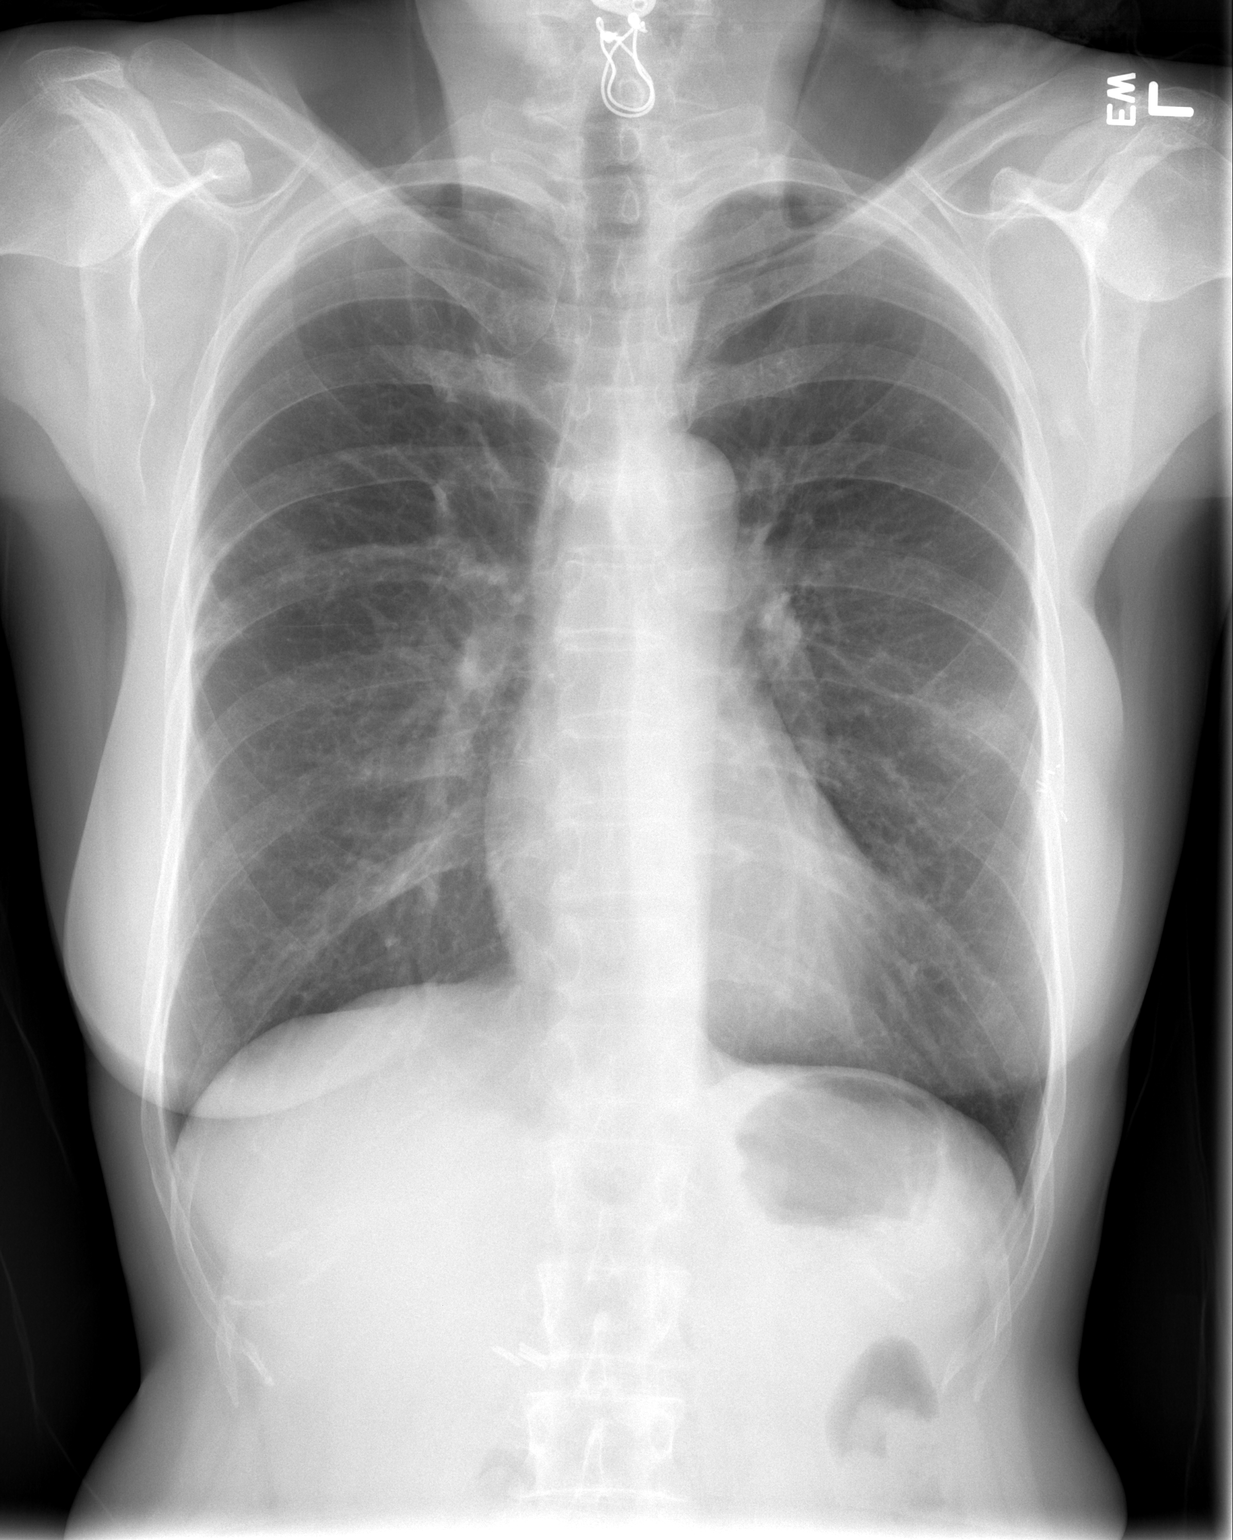

[w chest lat]
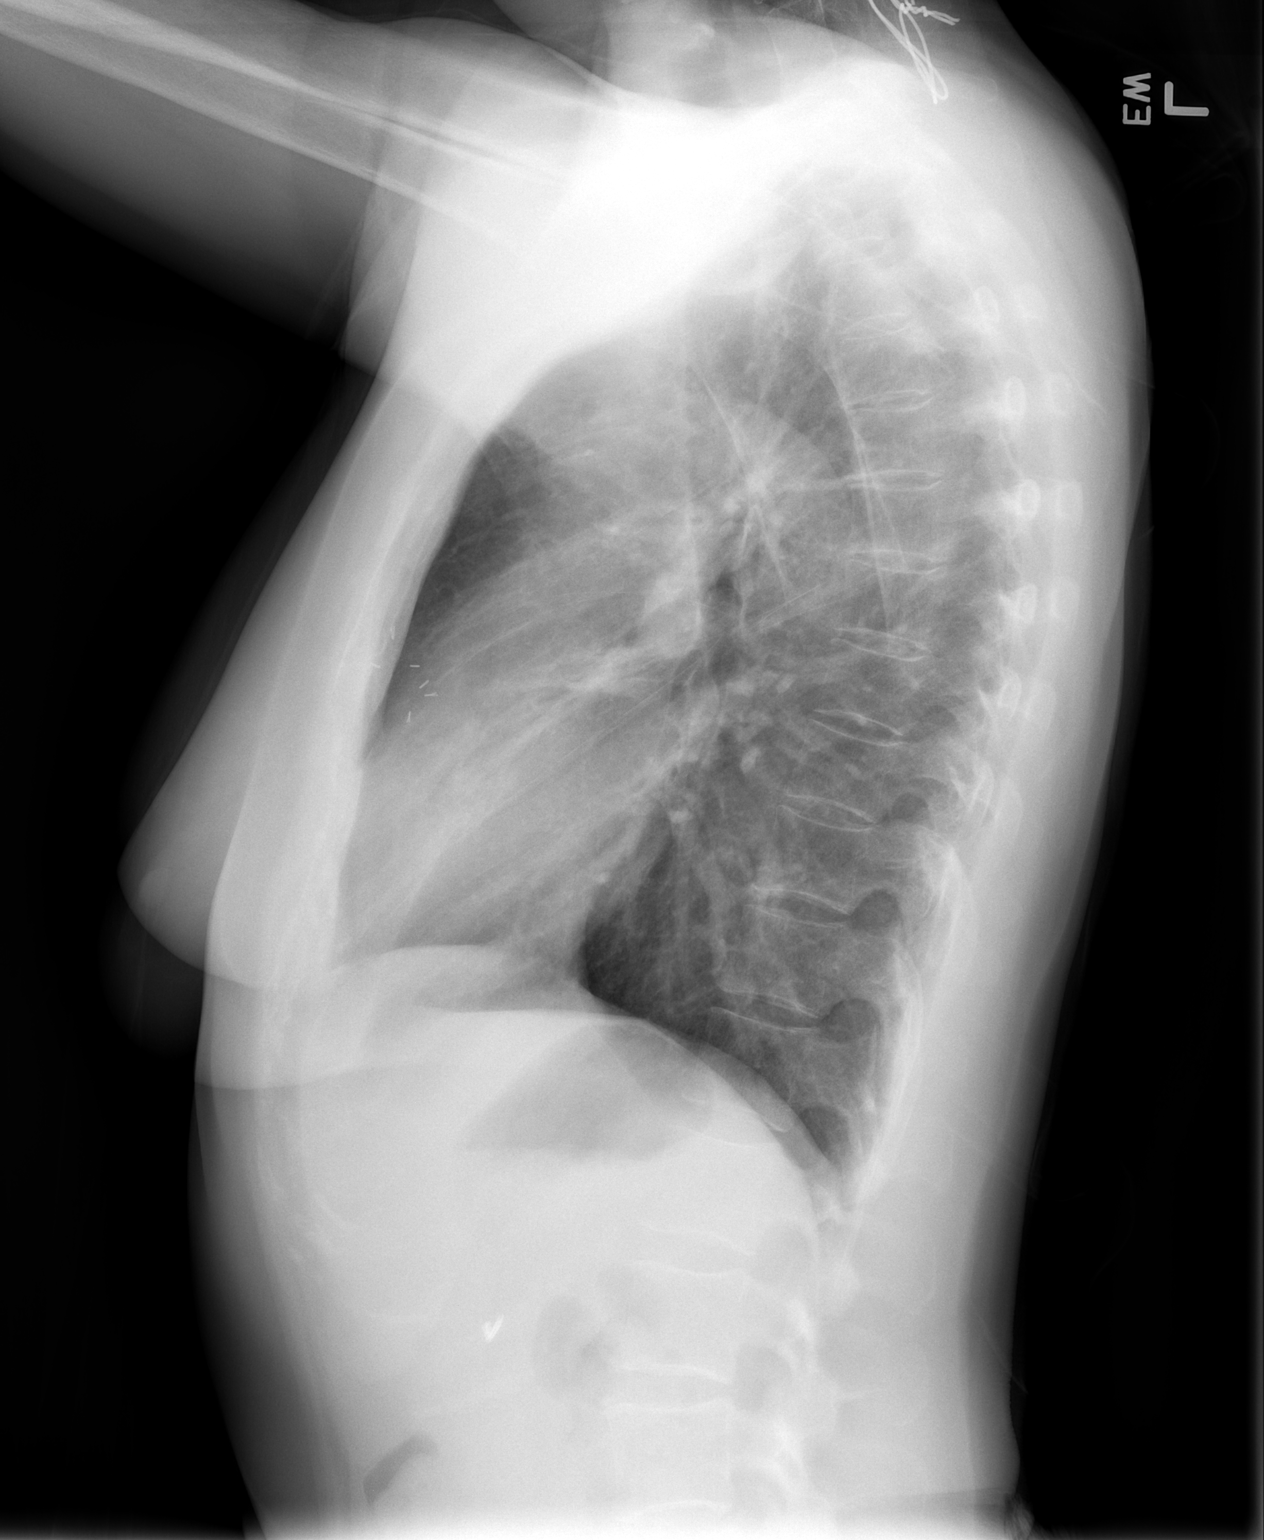

[2 of 2 positions shown; findings below may reference images not displayed]

FINDINGS: The cardiac silhouette is normal size and shape.
Mediastinal and hilar contours appear stable.  No right pulmonary
infiltrates or nodules were evident. On the PA image there is a
vague 2 cm area of rounded opacity projecting over the posterior
lateral aspect of the left eighth rib. No pleural abnormality is
evident.  Previous lower cervical surgery been performed with wires
in place.  Surgical clips are present in the left breast.  There is
slightly osteopenic appearance of bones.  Cholecystectomy clips are
present.
IMPRESSION: History of increasing coughing, breast carcinoma, and asthma.  On
the PA image there is a somewhat vague rounded area of opacity
projecting over the posterior aspect of the left eighth rib. It is
possible this could reflect superimposition of ribs, breast tissue,
and pulmonary vessels. However, a mass or rounded infiltrative
density cannot be excluded. Recommend CT of the chest with IV
contrast for additional evaluation.

## 2013-12-15 ENCOUNTER — Encounter: Payer: Self-pay | Admitting: Family Medicine

## 2013-12-15 ENCOUNTER — Ambulatory Visit (INDEPENDENT_AMBULATORY_CARE_PROVIDER_SITE_OTHER): Payer: Medicare Other | Admitting: Family Medicine

## 2013-12-15 VITALS — BP 128/68 | HR 60 | Temp 97.9°F | Resp 16 | Ht 62.0 in | Wt 112.0 lb

## 2013-12-15 DIAGNOSIS — B852 Pediculosis, unspecified: Secondary | ICD-10-CM

## 2013-12-15 DIAGNOSIS — M545 Low back pain, unspecified: Secondary | ICD-10-CM

## 2013-12-15 IMAGING — CT CT CHEST W/ CM
2 of 6 series · 15 of 36 positions shown, 19 images · IV contrast (Omnipaque 300)
Comparison: Chest radiograph 05/29/2011 and CT chest 12/12/2009.

CLINICAL DATA: Productive cough and shortness of breath with chest
tightness.  Possible lung mass.  History of breast cancer.

CT CHEST WITH CONTRAST
TECHNIQUE: Multidetector CT imaging of the chest was performed
following the standard protocol during bolus administration of
intravenous contrast.
Contrast: 80mL OMNIPAQUE IOHEXOL 300 MG/ML  SOLN

[Series 2: chest routine with · axial · 0.61mm/px · z∈[-224,+16]mm · 13 of 54 slices shown, 17 images]
[im 3/54  mediastinal]
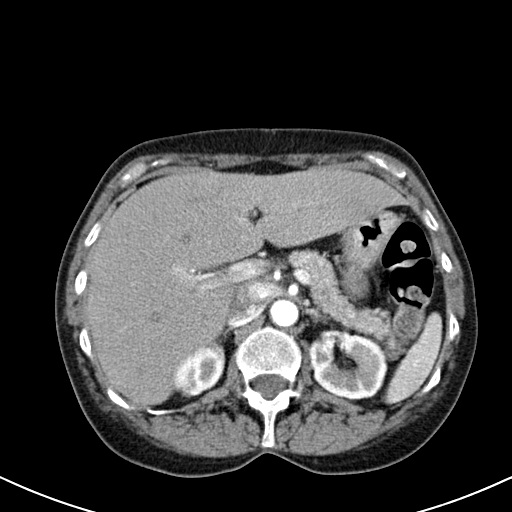
[im 3/54  lung]
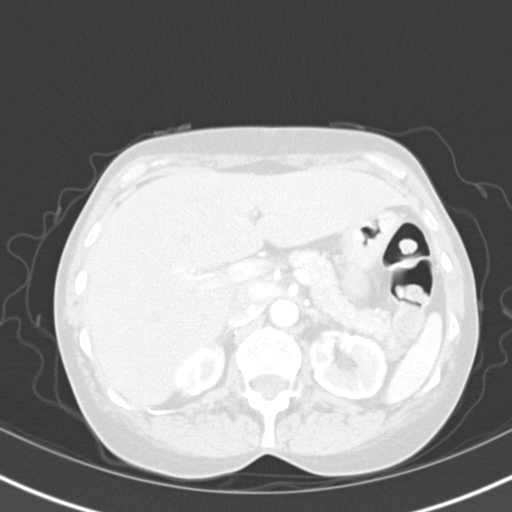
[im 8/54  lung]
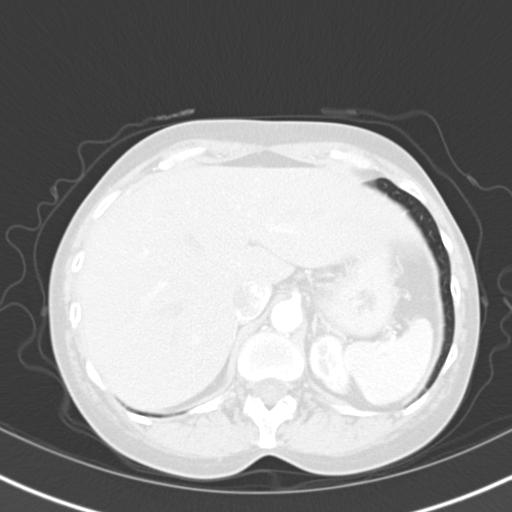
[im 13/54  lung]
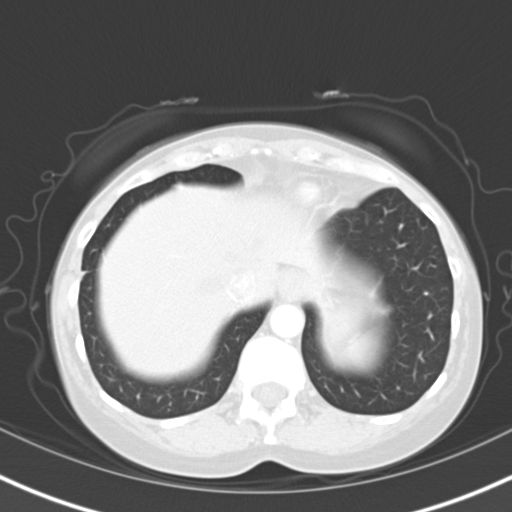
[im 15/54  lung]
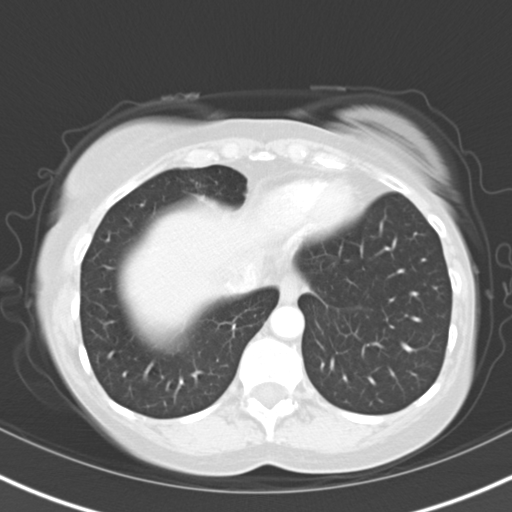
[im 20/54  mediastinal]
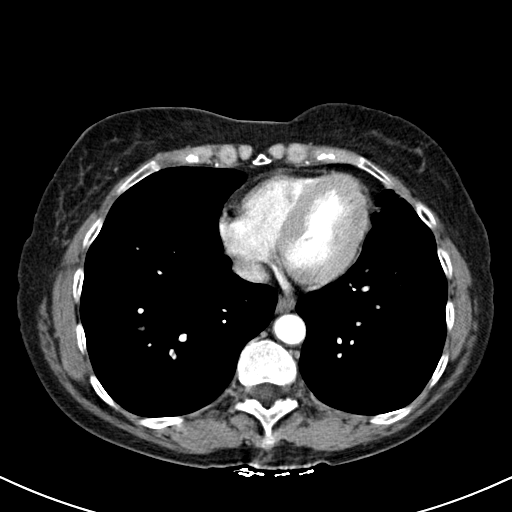
[im 20/54  lung]
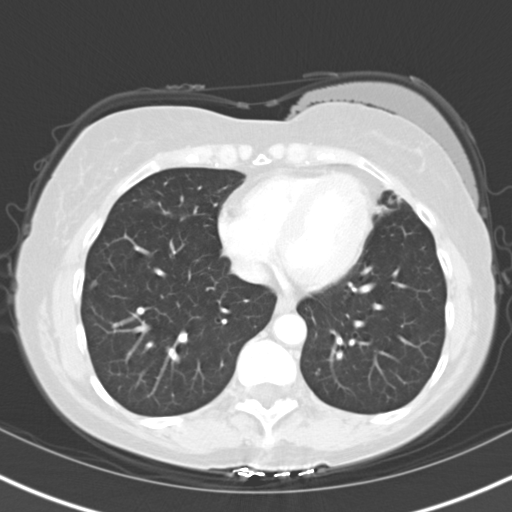
[im 22/54  lung]
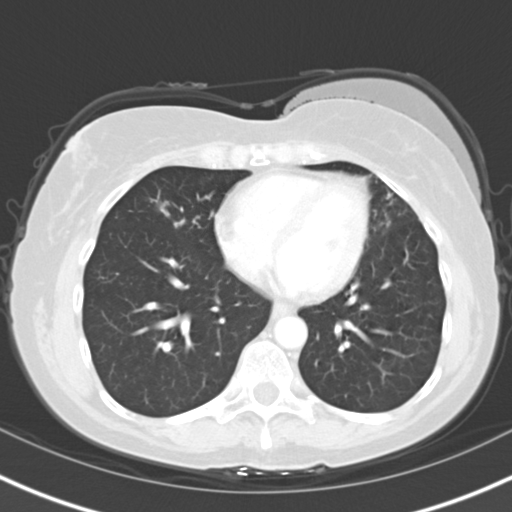
[im 27/54  lung]
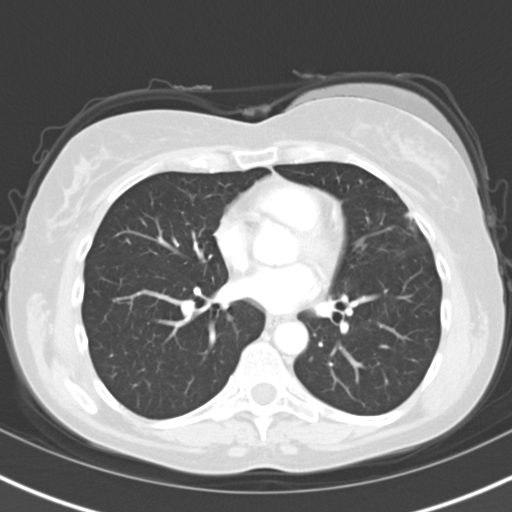
[im 32/54  lung]
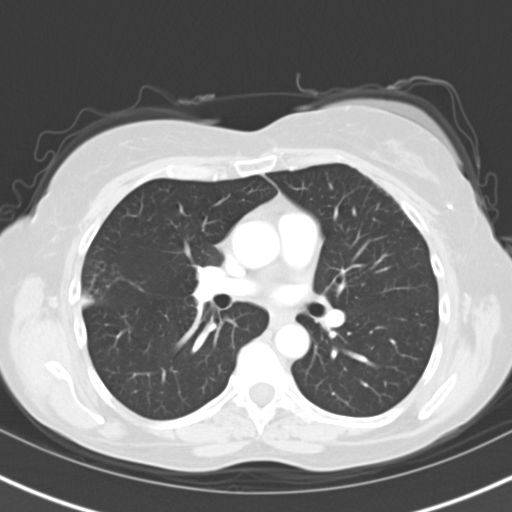
[im 34/54  mediastinal]
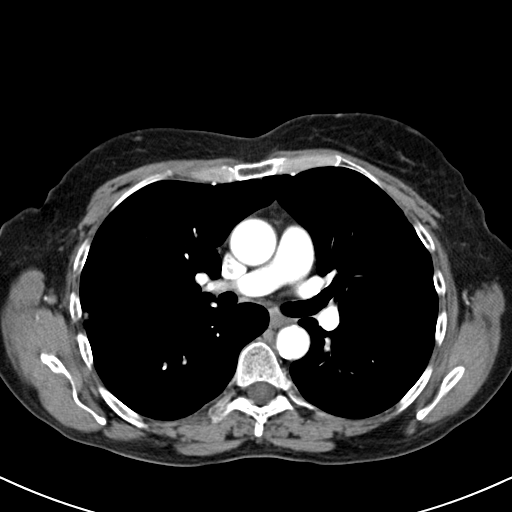
[im 34/54  lung]
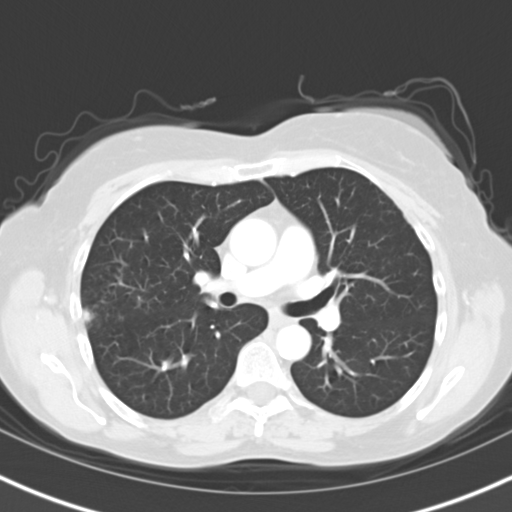
[im 39/54  lung]
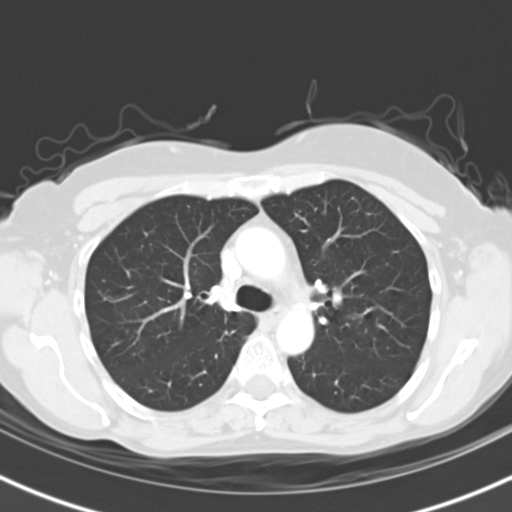
[im 41/54  lung]
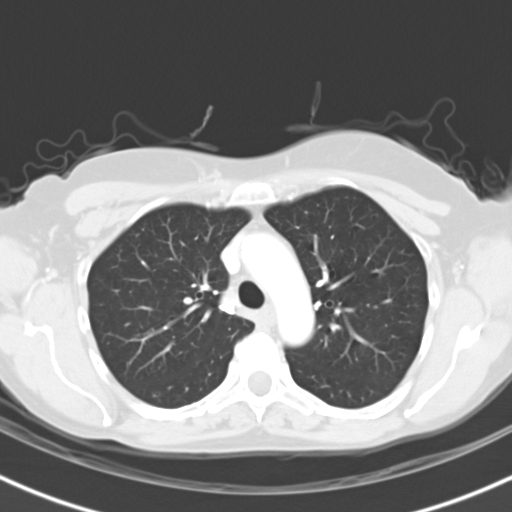
[im 46/54  lung]
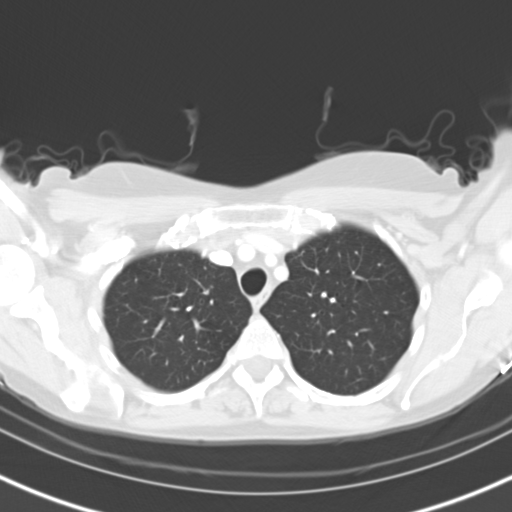
[im 51/54  mediastinal]
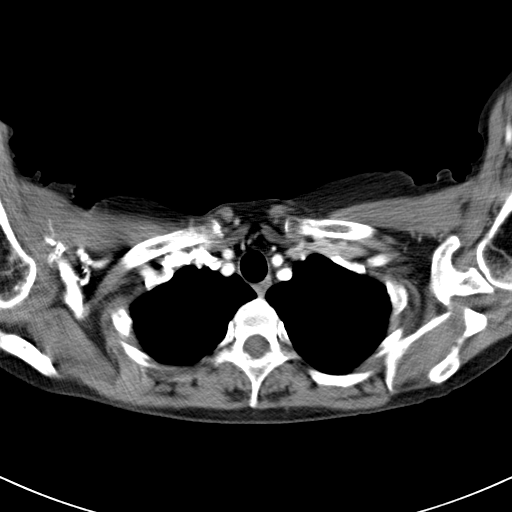
[im 51/54  lung]
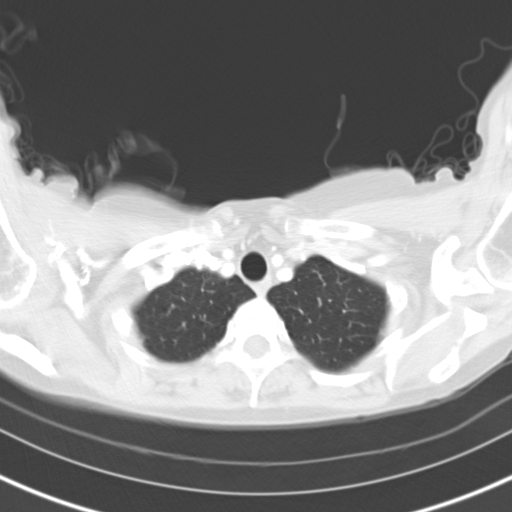

[Series 602: cor · coronal · 0.61mm/px · 2 of 85 slices shown]
[im 29/85  lung]
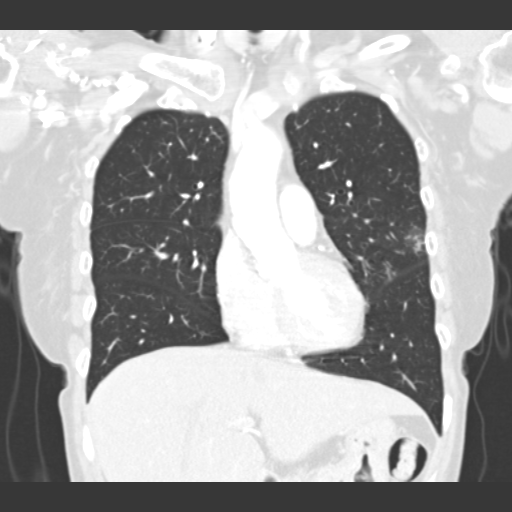
[im 57/85  lung]
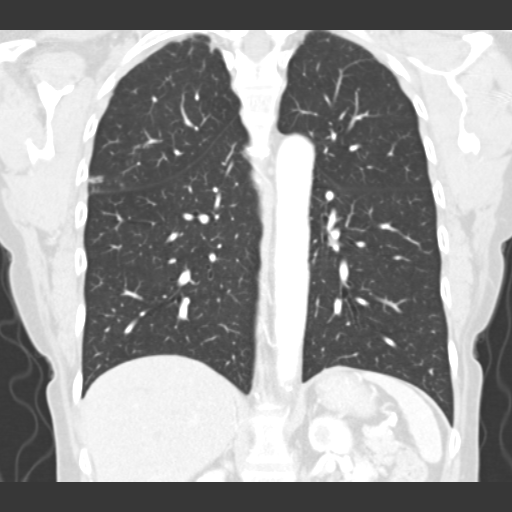

[15 of 36 positions shown; findings below may reference images not displayed]

FINDINGS: No pathologically enlarged mediastinal, hilar, axillary
or internal mammary lymph nodes.  Coronary artery calcification.
Heart size normal.  No pericardial effusion.

Minimal biapical pleural parenchymal scarring.  Peribronchovascular
nodularity and airspace disease is seen in all lobes of both lungs,
with the exception of the left lower lobe.  Findings are worst in
the lingula and account for the abnormality on recent chest
radiograph.  No worrisome pulmonary nodules.  No pleural fluid.
Airway is unremarkable.

Incidental imaging of the upper abdomen shows no acute findings.
No worrisome lytic or sclerotic lesions.
IMPRESSION: Multilobar peribronchovascular nodularity and airspace disease is
indicative of bronchopneumonia.  Atypical etiologies are
considered.

## 2013-12-15 MED ORDER — KETOROLAC TROMETHAMINE 10 MG PO TABS: 10.0000 mg | ORAL_TABLET | Freq: Four times a day (QID) | ORAL | Status: DC | PRN

## 2013-12-15 MED ORDER — PERMETHRIN 0.5 % AERO: INHALATION_SPRAY | Freq: Once | Status: DC

## 2013-12-15 NOTE — Progress Notes (Signed)
Subjective:    Patient ID: Ebony Scott, female    DOB: 16-Nov-1950, 63 y.o.   MRN: [id number]  HPI Patient was exposed to lice at her grandchild's birthday party. She did hug the affected individual. They did not share any items. On examination today I see no evidence of lice or nits in her hair. Patient does have dandruff but this is the only thing present on examination. She does complain of midline low back pain to the left of her lower paraspinal area. She also has symptoms of piriformis syndrome with sciatica-like symptoms whenever she crosses her left leg over her right leg. She denies any recent injury. She denies any numbness or weakness in the legs. Past Medical History  Diagnosis Date  . Chronic neck pain   . Allergic rhinitis   . Moderate persistent asthma     -FeV1 72% 2011, -IgE 102 2011, CT sinus Neg 2011  . GERD (gastroesophageal reflux disease)   . Hypothyroidism   . Osteoporosis     on reclast yearly  . Hiatal hernia   . Diverticulosis   . Hemorrhoids   . Asthma   . IBS (irritable bowel syndrome)   . Complication of anesthesia     "had hard time waking up from it several times" (02/20/2012)  . Hypercholesteremia   . Pneumonia 04/2011; ~ 11/2011    "double; single" (02/20/2012)  . Exertional dyspnea   . Graves disease   . Headache(784.0)     "related to allergies; more at different times during the year" (02/20/2012)  . Arthritis     "mostly the hands" (02/20/2012)  . Fibromyalgia 11/2011  . Chronic lower back pain   . Depression     "some; don't take anything for it" (02/20/2012)  . Breast cancer 1998    in remission  . Anxiety   . DVT (deep venous thrombosis)    Past Surgical History  Procedure Laterality Date  . Cervical fusion  2003    C3-C4  . Cervical discectomy  10/2001    C5-C6  . Vesicovaginal fistula closure w/ tah  1988  . Nasal septum surgery  1980's  . Anterior and posterior repair  1990's  . Breast lumpectomy  1998    left  . Debridement tennis  elbow  ?1970's    right  . Knee arthroplasty  ?1990's    "?right; w/cartilage repair" (02/20/2012)  . Carpometacarpel ([facility]) fusion of thumb with autograft from radius  ~ 2009    "both thumbs" (02/20/2012)  . Cataract extraction w/ intraocular lens  implant, bilateral  2012  . Tonsillectomy  ~ 1953  . Posterior cervical fusion/foraminotomy  2004    "failed initial fusion; rewired  anterior neck" (02/20/2012)   Current Outpatient Prescriptions on File Prior to Visit  Medication Sig Dispense Refill  . acetaminophen (TYLENOL ARTHRITIS PAIN) 650 MG CR tablet Take 650 mg by mouth 2 (two) times daily.       Marland Kitchen albuterol (PROAIR HFA) 108 (90 BASE) MCG/ACT inhaler Inhale 1-2 puffs into the lungs every 6 (six) hours as needed for wheezing or shortness of breath.  1 Inhaler  3  . Alpha-D-Galactosidase (BEANO) TABS Take 1 tablet by mouth daily.       . Alpha-Lipoic Acid 600 MG CAPS Take 600 mg by mouth daily. Once a day      . azelastine (ASTELIN) 0.1 % nasal spray Place 2 sprays into both nostrils daily. Use in each nostril as directed  30 mL  6  . Calcium Citrate-Vitamin D (CITRACAL MAXIMUM) 315-250 MG-UNIT TABS Take 1 tablet by mouth 2 (two) times daily.       . Cholecalciferol (VITAMIN D) 2000 UNITS CAPS 1/2 at bedtime      . cyproheptadine (PERIACTIN) 4 MG tablet Take 4 mg by mouth daily.       Marland Kitchen denosumab (PROLIA) 60 MG/ML SOLN injection Inject 60 mg into the skin every 6 (six) months. Administer in upper arm, thigh, or abdomen      . dexlansoprazole (DEXILANT) 60 MG capsule Take 60 mg by mouth daily.      . DULERA 100-5 MCG/ACT AERO INHALE 1 PUFF BY MOUTH TWICE DAILY  13 g  5  . EPINEPHrine (EPIPEN JR) 0.15 MG/0.3ML injection Inject 0.3 mLs (0.15 mg total) into the muscle as needed. For reaction  1 each  4  . gabapentin (NEURONTIN) 600 MG tablet Take 1 tablet by mouth daily.      . hydrocortisone (PROCTOZONE-HC) 2.5 % rectal cream Apply as needed as directed  30 g  11  . lactase (LACTAID) 3000 UNITS  tablet Take 3 tablets by mouth as needed. 3-4 tabs with dairy liquids and solids      . lidocaine (LIDODERM) 5 % Place 1 patch onto the skin daily as needed. Remove & Discard patch within 12 hours or as directed by MD  30 patch  11  . Melatonin 3 MG CAPS Take 9 mg by mouth at bedtime. 3 tabs at bedtime      . mometasone (NASONEX) 50 MCG/ACT nasal spray Place 2 sprays into the nose 2 (two) times daily.  17 g  11  . montelukast (SINGULAIR) 10 MG tablet TAKE 1 TABLET BY MOUTH ONCE DAILY  30 tablet  11  . neomycin-bacitracin-polymyxin (NEOSPORIN) ointment Apply 1 application topically every 12 (twelve) hours. Use on/in nose as directed as needed      . nystatin (MYCOSTATIN) 100000 UNIT/ML suspension SWISH,GARLE AND SPIT WITH 10ML(2 TEASPOONSFUL) BY MOUTH THREE TIMES DAILY  180 mL  0  . nystatin-triamcinolone ointment (MYCOLOG) as needed.      Marland Kitchen omalizumab (XOLAIR) 150 MG injection Inject 150 mg into the skin every 28 (twenty-eight) days. 150 SQ every 4 weeks      . Pancrelipase, Lip-Prot-Amyl, (CREON) 24000 UNITS CPEP TAKE 1 CAPSULE BY MOUTH DAILY 3-4 TIMES DAILY(WITH EACH MEAL)  180 each  11  . Polyethyl Glycol-Propyl Glycol (SYSTANE ULTRA) 0.4-0.3 % SOLN Apply 1 drop to eye 2 (two) times daily as needed. 1-2 drops each eye every morning and at bedtime      . Prenatal Vit-Sel-Fe Fum-FA (VINATE M) 27-1 MG TABS Take 1 tablet by mouth daily.      . Probiotic Product (ALIGN) 4 MG CAPS Take 1 tablet by mouth daily. Once a day      . propylthiouracil (PTU) 50 MG tablet take 1/2 tablet (25 mg) twice daily      . pseudoephedrine-guaifenesin (MUCINEX D) 60-600 MG per tablet Take 1 tablet by mouth every 12 (twelve) hours.  60 tablet  3  . rosuvastatin (CRESTOR) 10 MG tablet TAKE 1 TABLET BY MOUTH DAILY  30 tablet  11  . Simethicone (GAS-X EXTRA STRENGTH) 125 MG CAPS Take 2 each by mouth daily as needed. For gas relief      . [DISCONTINUED] methimazole (TAPAZOLE) 5 MG tablet Take 5 mg by mouth 3 (three) times  daily.         No current facility-administered medications  on file prior to visit.   Allergies  Allergen Reactions  . Dust Mite Extract Shortness Of Breath and Other (See Comments)    "sneezing" (02/20/2012)  . Molds & Smuts Shortness Of Breath  . Morphine Sulfate Itching  . Other Shortness Of Breath and Other (See Comments)    Grass and weeds "sneezing; filled sinuses" (02/20/2012)  . Penicillins Rash and Other (See Comments)    "welts" (02/20/2012)  . Reclast [Zoledronic Acid]     Put in hospital  . Rofecoxib Swelling    Celebrex REACTION: feet swelling  . Shrimp Flavor Anaphylaxis    ALL SHELLFISH  . Tetracycline Hcl Nausea And Vomiting  . Dilaudid [Hydromorphone Hcl] Itching  . Hydrocodone-Acetaminophen Nausea And Vomiting  . Levofloxacin Other (See Comments)    REACTION: GI upset  . Oxycodone Hcl Nausea And Vomiting  . Paroxetine Nausea And Vomiting    Paxil   . Soy Allergy Other (See Comments)    Limited soy d/t "ESTROGEN POSITIVE" cancer  . Tree Extract Other (See Comments)    "tested and told I was allergic to it; never experienced a reaction to it" (02/20/2012)   History   Social History  . Marital Status: Divorced    Spouse Name: N/A    Number of Children: 2  . Years of Education: college   Occupational History  . Disabled     Retired Print production planner  .     Social History Main Topics  . Smoking status: Never Smoker   . Smokeless tobacco: Never Used  . Alcohol Use: 0.0 oz/week     Comment: 02/20/2012 "couple glasses of wine/6 months"  . Drug Use: No  . Sexual Activity: No   Other Topics Concern  . Not on file   Social History Narrative   Patient lives at home alone. Patient  divorced.    Patient has her BS degree.   Right handed.   Caffeine- sometimes coffee.               Review of Systems  All other systems reviewed and are negative.      Objective:   Physical Exam  Vitals reviewed. Cardiovascular: Normal rate and  regular rhythm.   Pulmonary/Chest: Effort normal and breath sounds normal.  Musculoskeletal:       Lumbar back: She exhibits decreased range of motion, tenderness, pain and spasm. She exhibits no bony tenderness.          Assessment & Plan:  Lice - Plan: pyrethrins-piperonyl butoxide 0.5 % bottle  Midline low back pain without sciatica - Plan: ketorolac (TORADOL) 10 MG tablet  I see no evidence of lice today on examination but I will give the patient a prescription for RID in the event that she discovers any infestation later.  A believe the patient has injured a muscle in her lower back. She may also be dealing with piriformis syndrome area and also the patient on Toradol 10 mg every 6 hours when necessary pain. I advised her to avoid all other NSAIDs while she is on this medication.Marland Kitchen

## 2013-12-20 IMAGING — CR DG CHEST 2V
2 series · 2 of 2 positions shown · non-contrast
Comparison: 05/29/2011

CLINICAL DATA: Follow-up pneumonia.  Shortness of breath.

CHEST - 2 VIEW

[w chest pa]
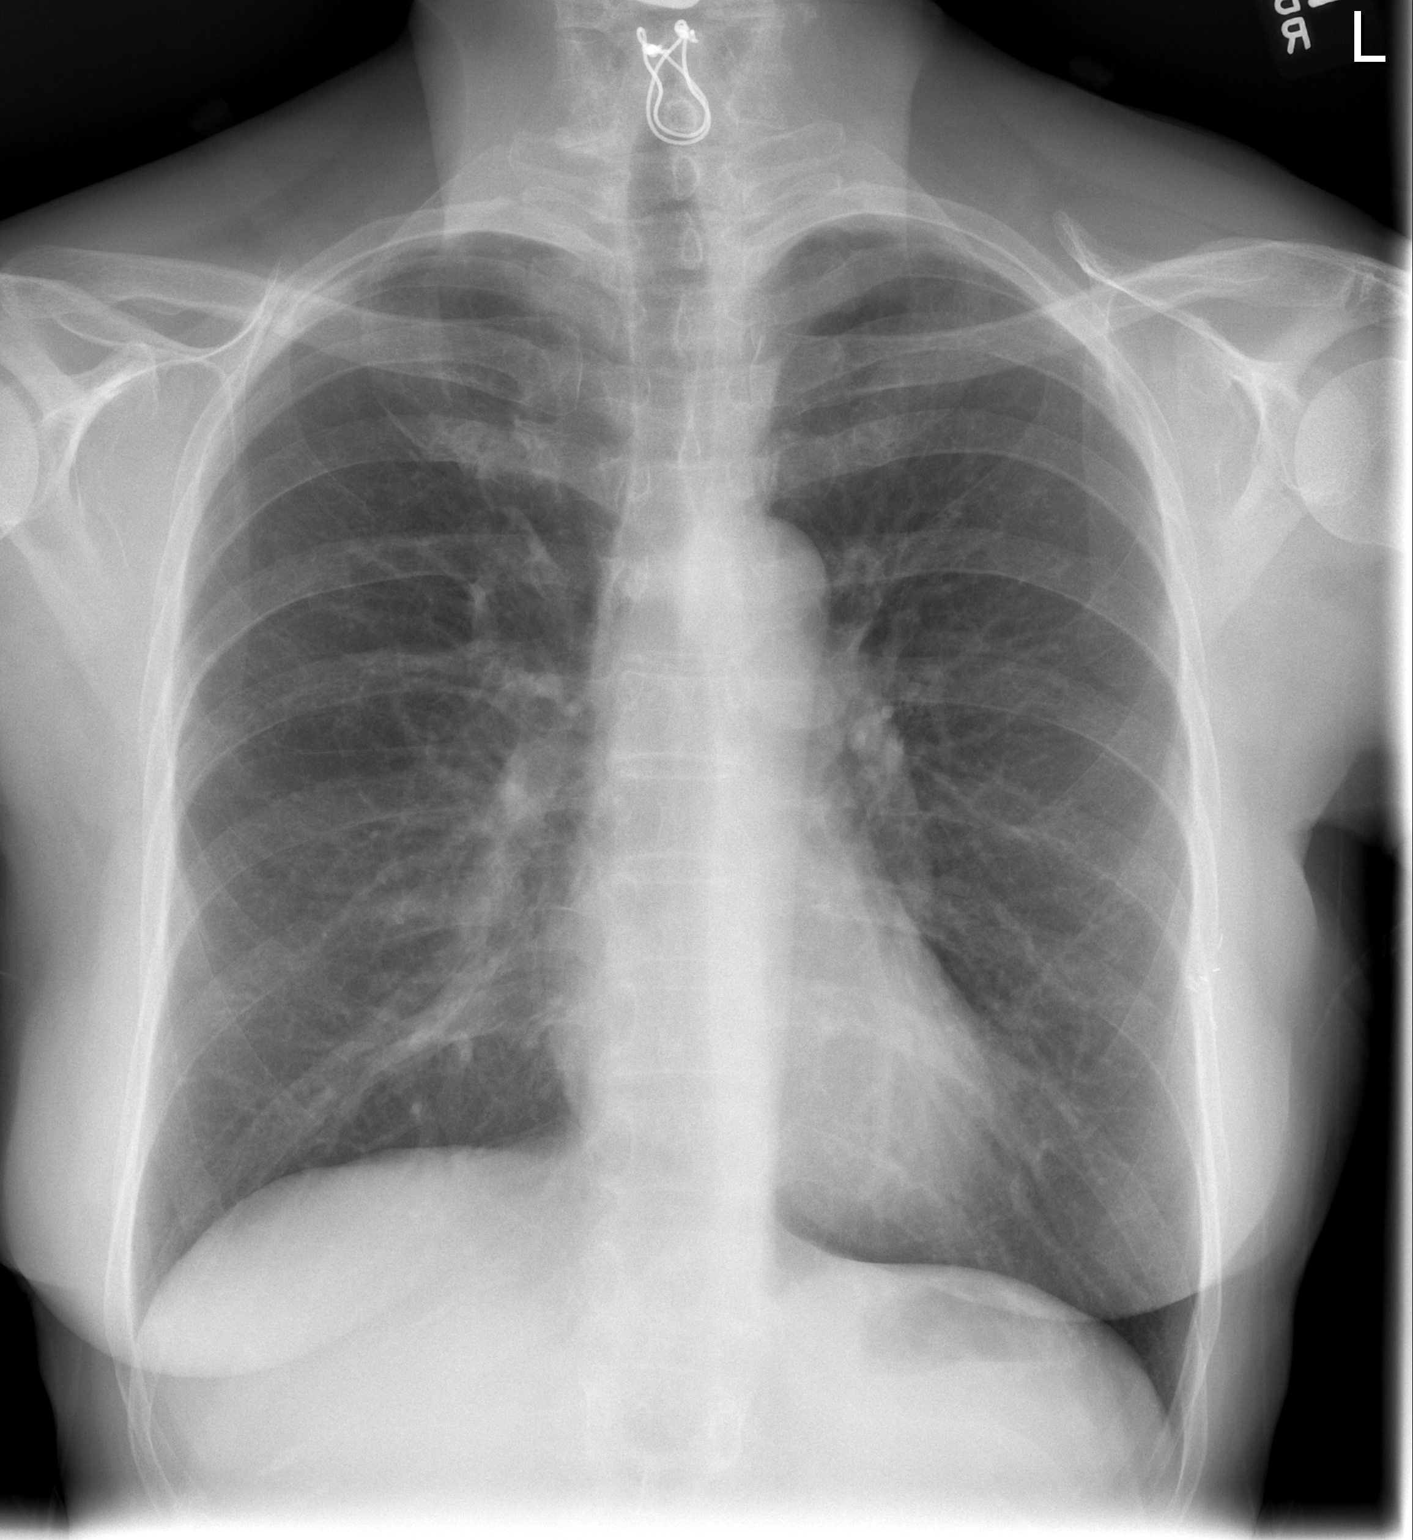

[w chest lat]
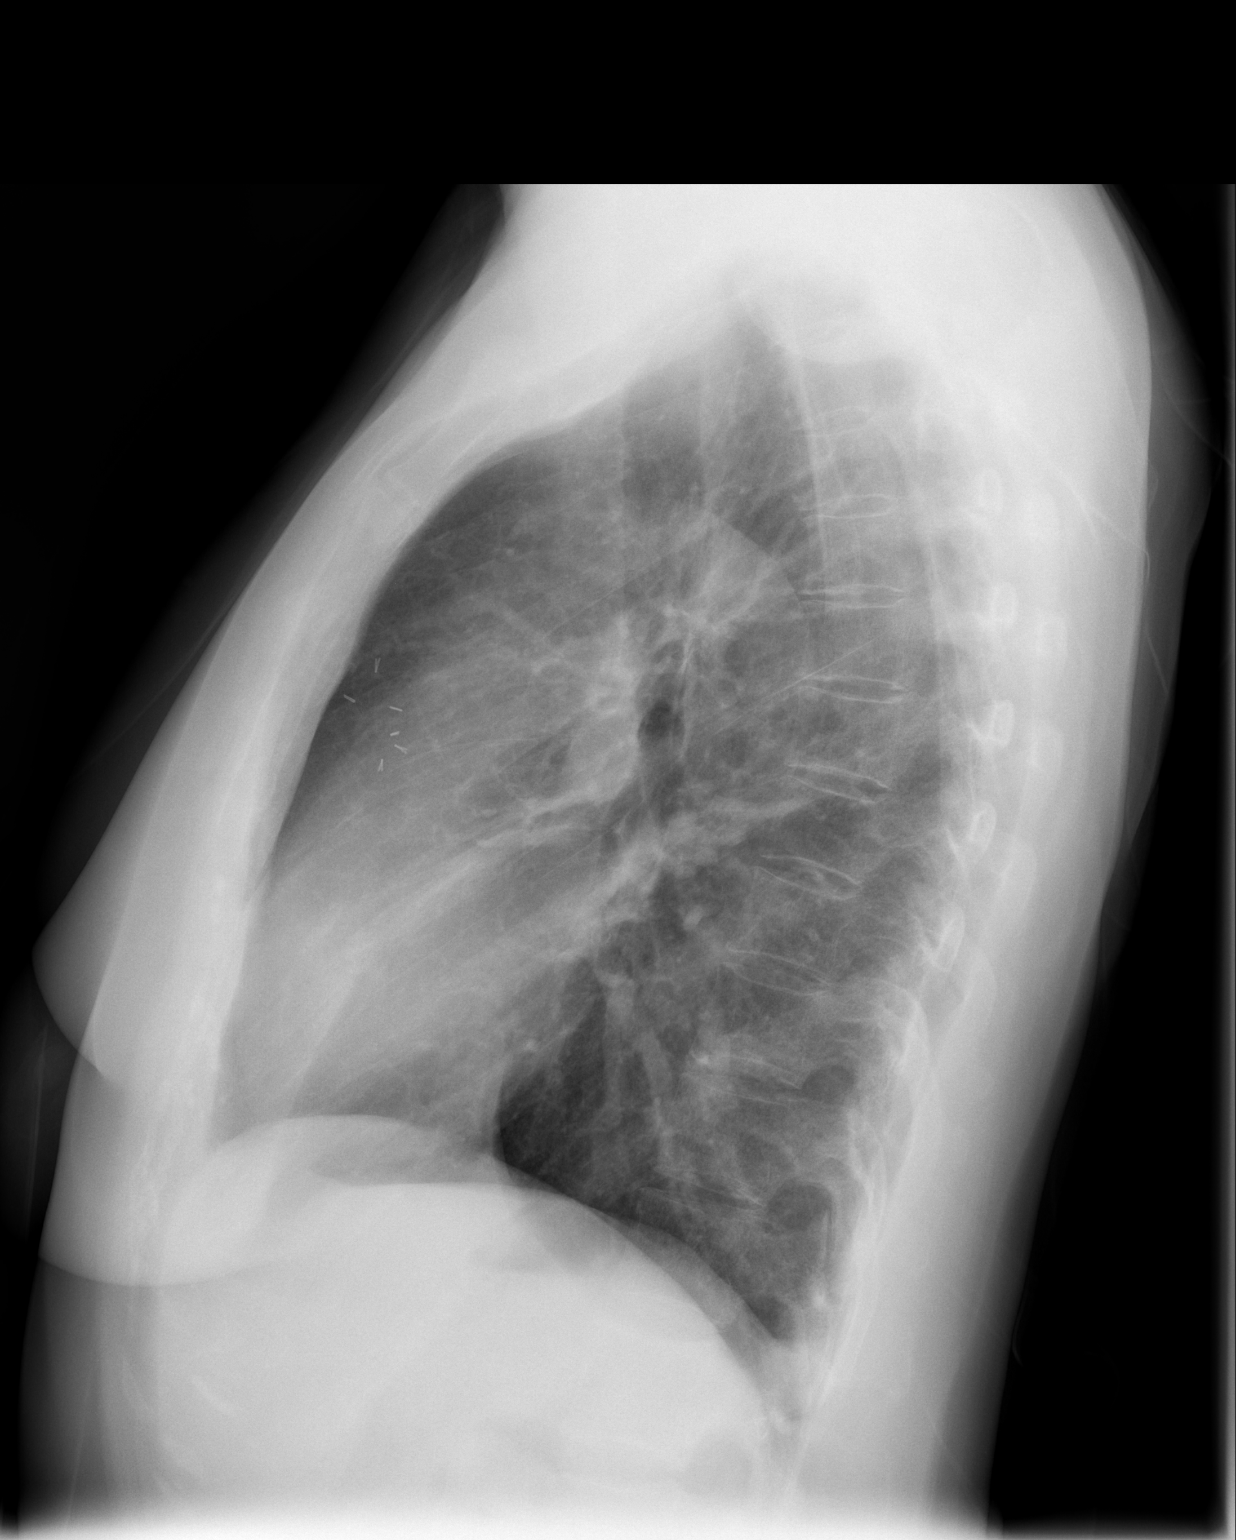

[2 of 2 positions shown; findings below may reference images not displayed]

FINDINGS: Mid lung airspace opacities bilaterally have resolved
since prior study. There is hyperinflation of the lungs compatible
with COPD.  Heart and mediastinal contours are within normal
limits.  No focal opacities or effusions.  No acute bony
abnormality.
IMPRESSION: Interval clearance of bilateral mid lung airspace
opacities/pneumonia.

COPD.

No active disease.

## 2013-12-23 ENCOUNTER — Ambulatory Visit (INDEPENDENT_AMBULATORY_CARE_PROVIDER_SITE_OTHER): Payer: Medicare Other | Admitting: Physician Assistant

## 2013-12-23 ENCOUNTER — Encounter: Payer: Self-pay | Admitting: Physician Assistant

## 2013-12-23 VITALS — BP 152/76 | HR 80 | Temp 98.0°F | Resp 18 | Wt 115.0 lb

## 2013-12-23 DIAGNOSIS — J020 Streptococcal pharyngitis: Secondary | ICD-10-CM

## 2013-12-23 DIAGNOSIS — J029 Acute pharyngitis, unspecified: Secondary | ICD-10-CM

## 2013-12-23 LAB — RAPID STREP SCREEN (MED CTR MEBANE ONLY): Streptococcus, Group A Screen (Direct): POSITIVE — AB

## 2013-12-23 MED ORDER — FLUCONAZOLE 150 MG PO TABS: 150.0000 mg | ORAL_TABLET | Freq: Once | ORAL | Status: DC

## 2013-12-23 MED ORDER — AZITHROMYCIN 500 MG PO TABS: 500.0000 mg | ORAL_TABLET | Freq: Every day | ORAL | Status: DC

## 2013-12-23 NOTE — Progress Notes (Signed)
Patient ID: Ebony Scott MRN: [id number], DOB: [birth year], 63 y.o. Date of Encounter: 12/23/2013, 3:08 PM    Chief Complaint:  Chief Complaint  Patient presents with  . sore throat     HPI: 63 y.o. year old female H she has had sore throat for several days. Only has mild nasal congestion. A little bit of postnasal drainage but not any chest congestion or deep cough. Has had symptomatic fevers and chills but has not actually checked it with a thermometer. No other complaints.     Home Meds:   Outpatient Prescriptions Prior to Visit  Medication Sig Dispense Refill  . acetaminophen (TYLENOL ARTHRITIS PAIN) 650 MG CR tablet Take 650 mg by mouth 2 (two) times daily.     Marland Kitchen albuterol (PROAIR HFA) 108 (90 BASE) MCG/ACT inhaler Inhale 1-2 puffs into the lungs every 6 (six) hours as needed for wheezing or shortness of breath. 1 Inhaler 3  . Alpha-D-Galactosidase (BEANO) TABS Take 1 tablet by mouth daily.     . Alpha-Lipoic Acid 600 MG CAPS Take 600 mg by mouth daily. Once a day    . azelastine (ASTELIN) 0.1 % nasal spray Place 2 sprays into both nostrils daily. Use in each nostril as directed 30 mL 6  . Calcium Citrate-Vitamin D (CITRACAL MAXIMUM) 315-250 MG-UNIT TABS Take 1 tablet by mouth 2 (two) times daily.     . Cholecalciferol (VITAMIN D) 2000 UNITS CAPS 1/2 at bedtime    . cyproheptadine (PERIACTIN) 4 MG tablet Take 4 mg by mouth daily.     Marland Kitchen denosumab (PROLIA) 60 MG/ML SOLN injection Inject 60 mg into the skin every 6 (six) months. Administer in upper arm, thigh, or abdomen    . dexlansoprazole (DEXILANT) 60 MG capsule Take 60 mg by mouth daily.    . DULERA 100-5 MCG/ACT AERO INHALE 1 PUFF BY MOUTH TWICE DAILY 13 g 5  . EPINEPHrine (EPIPEN JR) 0.15 MG/0.3ML injection Inject 0.3 mLs (0.15 mg total) into the muscle as needed. For reaction 1 each 4  . gabapentin (NEURONTIN) 600 MG tablet Take 1 tablet by mouth daily.    . hydrocortisone (PROCTOZONE-HC) 2.5 % rectal cream Apply as needed  as directed 30 g 11  . ketorolac (TORADOL) 10 MG tablet Take 1 tablet (10 mg total) by mouth every 6 (six) hours as needed. 20 tablet 0  . lactase (LACTAID) 3000 UNITS tablet Take 3 tablets by mouth as needed. 3-4 tabs with dairy liquids and solids    . lidocaine (LIDODERM) 5 % Place 1 patch onto the skin daily as needed. Remove & Discard patch within 12 hours or as directed by MD 30 patch 11  . Melatonin 3 MG CAPS Take 9 mg by mouth at bedtime. 3 tabs at bedtime    . mometasone (NASONEX) 50 MCG/ACT nasal spray Place 2 sprays into the nose 2 (two) times daily. 17 g 11  . montelukast (SINGULAIR) 10 MG tablet TAKE 1 TABLET BY MOUTH ONCE DAILY 30 tablet 11  . neomycin-bacitracin-polymyxin (NEOSPORIN) ointment Apply 1 application topically every 12 (twelve) hours. Use on/in nose as directed as needed    . nystatin (MYCOSTATIN) 100000 UNIT/ML suspension SWISH,GARLE AND SPIT WITH 10ML(2 TEASPOONSFUL) BY MOUTH THREE TIMES DAILY 180 mL 0  . nystatin-triamcinolone ointment (MYCOLOG) as needed.    Marland Kitchen omalizumab (XOLAIR) 150 MG injection Inject 150 mg into the skin every 28 (twenty-eight) days. 150 SQ every 4 weeks    . Pancrelipase, Lip-Prot-Amyl, (CREON) 24000 UNITS  CPEP TAKE 1 CAPSULE BY MOUTH DAILY 3-4 TIMES DAILY(WITH EACH MEAL) 180 each 11  . Polyethyl Glycol-Propyl Glycol (SYSTANE ULTRA) 0.4-0.3 % SOLN Apply 1 drop to eye 2 (two) times daily as needed. 1-2 drops each eye every morning and at bedtime    . Prenatal Vit-Sel-Fe Fum-FA (VINATE M) 27-1 MG TABS Take 1 tablet by mouth daily.    . Probiotic Product (ALIGN) 4 MG CAPS Take 1 tablet by mouth daily. Once a day    . propylthiouracil (PTU) 50 MG tablet take 1/2 tablet (25 mg) twice daily    . pseudoephedrine-guaifenesin (MUCINEX D) 60-600 MG per tablet Take 1 tablet by mouth every 12 (twelve) hours. 60 tablet 3  . pyrethrins-piperonyl butoxide 0.5 % bottle Apply topically once. 150 mL 0  . rosuvastatin (CRESTOR) 10 MG tablet TAKE 1 TABLET BY MOUTH  DAILY 30 tablet 11  . Simethicone (GAS-X EXTRA STRENGTH) 125 MG CAPS Take 2 each by mouth daily as needed. For gas relief     No facility-administered medications prior to visit.    Allergies:  Allergies  Allergen Reactions  . Dust Mite Extract Shortness Of Breath and Other (See Comments)    "sneezing" (02/20/2012)  . Molds & Smuts Shortness Of Breath  . Morphine Sulfate Itching  . Other Shortness Of Breath and Other (See Comments)    Grass and weeds "sneezing; filled sinuses" (02/20/2012)  . Penicillins Rash and Other (See Comments)    "welts" (02/20/2012)  . Reclast [Zoledronic Acid]     Put in hospital  . Rofecoxib Swelling    Celebrex REACTION: feet swelling  . Shrimp Flavor Anaphylaxis    ALL SHELLFISH  . Tetracycline Hcl Nausea And Vomiting  . Dilaudid [Hydromorphone Hcl] Itching  . Hydrocodone-Acetaminophen Nausea And Vomiting  . Levofloxacin Other (See Comments)    REACTION: GI upset  . Oxycodone Hcl Nausea And Vomiting  . Paroxetine Nausea And Vomiting    Paxil   . Soy Allergy Other (See Comments)    Limited soy d/t "ESTROGEN POSITIVE" cancer  . Tree Extract Other (See Comments)    "tested and told I was allergic to it; never experienced a reaction to it" (02/20/2012)      Review of Systems: See HPI for pertinent ROS. All other ROS negative.    Physical Exam: Blood pressure 152/76, pulse 80, temperature 98 F (36.7 C), temperature source Oral, resp. rate 18, weight 115 lb (52.164 kg)., Body mass index is 21.03 kg/(m^2). General: WNWD WF. Appears in no acute distress. HEENT: Normocephalic, atraumatic, eyes without discharge, sclera non-icteric, nares are without discharge. Bilateral auditory canals clear, TM's are without perforation, pearly grey and translucent with reflective cone of light bilaterally. Oral cavity moist, posterior pharynx with very mild erythema, no exudate, and no peritonsillar abscess.  Neck: Supple. No thyromegaly. She reports tenderness with  palpation of bilateral tonsillar and cervical nodes but I feel no enlarged nodes. Lungs: Clear bilaterally to auscultation without wheezes, rales, or rhonchi. Breathing is unlabored. Heart: Regular rhythm. No murmurs, rubs, or gallops. Msk:  Strength and tone normal for age. Extremities/Skin: Warm and dry.  No rashes. Neuro: Alert and oriented X 3. Moves all extremities spontaneously. Gait is normal. CNII-XII grossly in tact. Psych:  Responds to questions appropriately with a normal affect.   Results for orders placed or performed in visit on 12/23/13  Rapid Strep Screen  Result Value Ref Range   Source THROAT    Streptococcus, Group A Screen (Direct) POS (A) NEGATIVE  ASSESSMENT AND PLAN:  63 y.o. year old female with  1. Strep pharyngitis She has penicillin allergy so we'll have to use azithromycin which is second line treatment for strep. She is to take the antibiotic as directed and completed. Can use over-the-counter lozenges sprays Tylenol or Motrin as needed for pain relief. She is also concerned of Yeast infection and needs something for this so I have sent and Diflucan 1 to take at the end of the antibiotic. Follow up as needed. - azithromycin (ZITHROMAX) 500 MG tablet; Take 1 tablet (500 mg total) by mouth daily.  Dispense: 5 tablet; Refill: 0 - fluconazole (DIFLUCAN) 150 MG tablet; Take 1 tablet (150 mg total) by mouth once.  Dispense: 1 tablet; Refill: 0  2. Sorethroat - Rapid Strep Screen   Signed, Olean Ree [place], [place], [facility] 12/23/2013 3:08 PM

## 2013-12-28 ENCOUNTER — Telehealth: Payer: Self-pay | Admitting: Family Medicine

## 2013-12-28 DIAGNOSIS — J020 Streptococcal pharyngitis: Secondary | ICD-10-CM

## 2013-12-28 NOTE — Telephone Encounter (Signed)
Any suggestions before return call?

## 2013-12-28 NOTE — Telephone Encounter (Signed)
Patient is still having issues with her throat would like a call back as to what to do  [phone]

## 2013-12-28 NOTE — Telephone Encounter (Signed)
I am not sure as to what she is referring.

## 2013-12-29 ENCOUNTER — Telehealth: Payer: Self-pay | Admitting: Family Medicine

## 2013-12-29 MED ORDER — AZITHROMYCIN 500 MG PO TABS: 500.0000 mg | ORAL_TABLET | Freq: Every day | ORAL | Status: DC

## 2013-12-29 MED ORDER — FLUCONAZOLE 150 MG PO TABS: 150.0000 mg | ORAL_TABLET | Freq: Once | ORAL | Status: DC

## 2013-12-29 NOTE — Telephone Encounter (Signed)
Pt called questions answered

## 2013-12-29 NOTE — Telephone Encounter (Signed)
RX's to pharmacy and pt aware of provider recommendations

## 2013-12-29 NOTE — Telephone Encounter (Signed)
Patient has some questions about the diflucan that was called in for her please call her back at [phone]

## 2013-12-29 NOTE — Telephone Encounter (Signed)
Pt saw MBD for this - sending note to MBD. Pt states that she finished the antibx Sunday and Monday she started with the sore throat again. Not sure if she has a fever or not because her forehead thermometer would not register. She is also wondering if she is contagious or not at this point. Please call on cell [phone]

## 2013-12-29 NOTE — Telephone Encounter (Signed)
Tell her to take another round of treatment.  Send Rx for:  Azithromycin 500mg  po QD x 5 days. Send 2 more Diflucan.  Regarding being contagious; Tell her to stay at home for 2 days.

## 2013-12-30 ENCOUNTER — Other Ambulatory Visit: Payer: Self-pay | Admitting: Critical Care Medicine

## 2014-01-01 ENCOUNTER — Other Ambulatory Visit: Payer: Self-pay | Admitting: Family Medicine

## 2014-01-01 MED ORDER — NYSTATIN 100000 UNIT/ML MT SUSP: OROMUCOSAL | Status: DC

## 2014-01-01 MED ORDER — PROPYLTHIOURACIL 50 MG PO TABS: ORAL_TABLET | ORAL | Status: DC

## 2014-01-01 NOTE — Telephone Encounter (Signed)
Meed sent to pharm

## 2014-01-04 ENCOUNTER — Encounter: Payer: Self-pay | Admitting: Critical Care Medicine

## 2014-01-04 ENCOUNTER — Telehealth: Payer: Self-pay | Admitting: Family Medicine

## 2014-01-04 ENCOUNTER — Ambulatory Visit (INDEPENDENT_AMBULATORY_CARE_PROVIDER_SITE_OTHER): Payer: Medicare Other

## 2014-01-04 ENCOUNTER — Ambulatory Visit (INDEPENDENT_AMBULATORY_CARE_PROVIDER_SITE_OTHER): Payer: Medicare Other | Admitting: Critical Care Medicine

## 2014-01-04 VITALS — BP 124/68 | HR 75 | Temp 98.2°F | Ht 61.25 in | Wt 115.2 lb

## 2014-01-04 DIAGNOSIS — E039 Hypothyroidism, unspecified: Secondary | ICD-10-CM

## 2014-01-04 DIAGNOSIS — J309 Allergic rhinitis, unspecified: Secondary | ICD-10-CM

## 2014-01-04 DIAGNOSIS — M810 Age-related osteoporosis without current pathological fracture: Secondary | ICD-10-CM

## 2014-01-04 DIAGNOSIS — M1120 Other chondrocalcinosis, unspecified site: Secondary | ICD-10-CM

## 2014-01-04 DIAGNOSIS — E785 Hyperlipidemia, unspecified: Secondary | ICD-10-CM

## 2014-01-04 DIAGNOSIS — J302 Other seasonal allergic rhinitis: Secondary | ICD-10-CM

## 2014-01-04 DIAGNOSIS — Z79899 Other long term (current) drug therapy: Secondary | ICD-10-CM

## 2014-01-04 DIAGNOSIS — J3089 Other allergic rhinitis: Secondary | ICD-10-CM

## 2014-01-04 DIAGNOSIS — J4550 Severe persistent asthma, uncomplicated: Secondary | ICD-10-CM

## 2014-01-04 NOTE — Telephone Encounter (Signed)
Patient is calling to talk with you about her prolia shot and about when she needs to come in for her bloodowork  [phone]

## 2014-01-04 NOTE — Progress Notes (Signed)
Subjective:    Patient ID: Ebony Scott, female    DOB: 1950/03/15, 63 y.o.   MRN: [id number]  HPI 62 y.o.. WF atopic moderate persistent asthma, elevated IgE. Started Xolair 05/03/10     PUL ASTHMA HISTORY 01/05/2014 11/07/2013 07/17/2013 08/22/2011 01/31/2011 09/20/2010  Symptoms Daily Throughout the day 0-2 days/week Daily 0-2 days/week Daily  Nighttime awakenings 3-4/month 3-4/month 0-2/month 0-2/month 0-2/month -  Interference with activity Some limitations Some limitations Minor limitations Some limitations No limitations -  SABA use > 2 days/wk--not > 1 x/day Daily 0-2 days/wk 0-2 days/wk 0-2 days/wk -  Exacerbations requiring oral steroids 0-1 / year 2 or more / year 0-1 / year 0-1 / year 0-1 / year -    01/04/2014 Chief Complaint  Patient presents with  . 2 month follow up    increased SOB and chest tightness x 1 month.  Cough with occas yellow mucus.    Notes more dyspnea past few weeks, had strep throat and Rx abx and diflucan.  Notes some cough with yellow mucus. Notes some sneezing.  Saw ENT: ear issues. Allergies issues. Zyrtec: in addition to other meds Nose is running.    Review of SystemsConstitutional:   No  weight loss, night sweats,  Fevers, chills,  +fatigue, or  lassitude.  HEENT:   No headaches,  Difficulty swallowing,  Tooth/dental problems, or  Sore throat,                +sneezing, itching, ear ache,  +nasal congestion, +post nasal drip,   CV:  No chest pain,  Orthopnea, PND, swelling in lower extremities, anasarca, dizziness, palpitations, syncope.   GI  No heartburn, indigestion, abdominal pain, nausea, vomiting, diarrhea, change in bowel habits, loss of appetite, bloody stools.   Resp:  ,  No coughing up of blood.   No chest wall deformity  Skin: no rash or lesions.  GU: no dysuria, change in color of urine, no urgency or frequency.  No flank pain, no hematuria   MS:  No joint pain or swelling.  No decreased range of motion.  No back pain.  Psych:  No  change in mood or affect. No depression or anxiety.  No memory loss.      Objective:   Physical Exam  BP 124/68 mmHg  Pulse 75  Temp(Src) 98.2 F (36.8 C) (Oral)  Ht 5' 1.25" (1.556 m)  Wt 115 lb 3.2 oz (52.254 kg)  BMI 21.58 kg/m2  SpO2 97%  GEN: A/Ox3; pleasant , NAD, well nourished   HEENT:  [place]/AT,  EACs-clear, TMs-wnl, NOSE-clear drainage , THROAT, nasal purulence   NECK:  Supple w/ fair ROM; no JVD; normal carotid impulses w/o bruits; no thyromegaly or nodules palpated; no lymphadenopathy.  RESP  Clear  P & A; no accessory muscle use, no dullness to percussion  CARD:  RRR, no m/r/g  , no peripheral edema, pulses intact, no cyanosis or clubbing.  GI:   Soft & nt; nml bowel sounds; no organomegaly or masses detected.  Musco: Warm bil, no deformities or joint swelling noted.   Neuro: alert, no focal deficits noted.    Skin: Warm, no lesions or rashes   No results found.     Assessment & Plan:   Severe persistent asthma with significant atopic features Severe persistent asthma with significant atopic features now with increasing postnasal drainage and dry nares mucosa Plan Use saline nasal spray three sprays each nostril three times daily for one week then reduce to  as needed Increase nasonex two puff ea nostril twice daily No other changes Return 3 months     Updated Medication List Outpatient Encounter Prescriptions as of 01/04/2014  Medication Sig  . acetaminophen (TYLENOL ARTHRITIS PAIN) 650 MG CR tablet Take 650 mg by mouth 2 (two) times daily.   Marland Kitchen albuterol (PROAIR HFA) 108 (90 BASE) MCG/ACT inhaler Inhale 1-2 puffs into the lungs every 6 (six) hours as needed for wheezing or shortness of breath.  . Alpha-D-Galactosidase (BEANO) TABS Take 1 tablet by mouth daily.   . Alpha-Lipoic Acid 600 MG CAPS Take 600 mg by mouth daily. Once a day  . azelastine (ASTELIN) 0.1 % nasal spray Place 2 sprays into both nostrils daily. Use in each nostril as directed  .  Calcium Citrate-Vitamin D (CITRACAL MAXIMUM) 315-250 MG-UNIT TABS Take 1 tablet by mouth 2 (two) times daily.   . cetirizine (ZYRTEC) 10 MG tablet Take 10 mg by mouth daily.  . Cholecalciferol (VITAMIN D) 2000 UNITS CAPS 1/2 at bedtime  . cyproheptadine (PERIACTIN) 4 MG tablet Take 4 mg by mouth daily.   Marland Kitchen denosumab (PROLIA) 60 MG/ML SOLN injection Inject 60 mg into the skin every 6 (six) months. Administer in upper arm, thigh, or abdomen  . dexlansoprazole (DEXILANT) 60 MG capsule Take 60 mg by mouth daily.  . DULERA 100-5 MCG/ACT AERO INHALE 1 PUFF BY MOUTH TWICE DAILY  . EPINEPHrine (EPIPEN JR) 0.15 MG/0.3ML injection Inject 0.3 mLs (0.15 mg total) into the muscle as needed. For reaction  . gabapentin (NEURONTIN) 600 MG tablet Take 1 tablet by mouth daily.  . hydrocortisone (PROCTOZONE-HC) 2.5 % rectal cream Apply as needed as directed  . lactase (LACTAID) 3000 UNITS tablet Take 3 tablets by mouth as needed. 3-4 tabs with dairy liquids and solids  . Melatonin 3 MG CAPS Take 9 mg by mouth at bedtime. 3 tabs at bedtime  . mometasone (NASONEX) 50 MCG/ACT nasal spray Place 2 sprays into the nose 2 (two) times daily.  . montelukast (SINGULAIR) 10 MG tablet TAKE 1 TABLET BY MOUTH ONCE DAILY  . neomycin-bacitracin-polymyxin (NEOSPORIN) ointment Apply 1 application topically every 12 (twelve) hours. Use on/in nose as directed as needed  . nystatin-triamcinolone ointment (MYCOLOG) as needed.  Marland Kitchen omalizumab (XOLAIR) 150 MG injection Inject 150 mg into the skin every 28 (twenty-eight) days. 150 SQ every 4 weeks  . Pancrelipase, Lip-Prot-Amyl, (CREON) 24000 UNITS CPEP TAKE 1 CAPSULE BY MOUTH DAILY 3-4 TIMES DAILY(WITH EACH MEAL)  . Polyethyl Glycol-Propyl Glycol (SYSTANE ULTRA) 0.4-0.3 % SOLN Apply 1 drop to eye 2 (two) times daily as needed. 1-2 drops each eye every morning and at bedtime  . Prenatal Vit-Sel-Fe Fum-FA (VINATE M) 27-1 MG TABS Take 1 tablet by mouth daily.  . Probiotic Product (ALIGN) 4 MG  CAPS Take 1 tablet by mouth daily. Once a day  . propylthiouracil (PTU) 50 MG tablet take 1/2 tablet (25 mg) twice daily  . pseudoephedrine-guaifenesin (MUCINEX D) 60-600 MG per tablet Take 1 tablet by mouth every 12 (twelve) hours.  . rosuvastatin (CRESTOR) 10 MG tablet TAKE 1 TABLET BY MOUTH DAILY  . Simethicone (GAS-X EXTRA STRENGTH) 125 MG CAPS Take 2 each by mouth daily as needed. For gas relief  . lidocaine (LIDODERM) 5 % Place 1 patch onto the skin daily as needed. Remove & Discard patch within 12 hours or as directed by MD  . [DISCONTINUED] azithromycin (ZITHROMAX) 500 MG tablet Take 1 tablet (500 mg total) by mouth daily.  . [DISCONTINUED]  fluconazole (DIFLUCAN) 150 MG tablet Take 1 tablet (150 mg total) by mouth once. As directed  . [DISCONTINUED] ketorolac (TORADOL) 10 MG tablet Take 1 tablet (10 mg total) by mouth every 6 (six) hours as needed.  . [DISCONTINUED] nystatin (MYCOSTATIN) 100000 UNIT/ML suspension SWISH,GARLE AND SPIT WITH 10ML(2 TEASPOONSFUL) BY MOUTH THREE TIMES DAILY  . [DISCONTINUED] pyrethrins-piperonyl butoxide 0.5 % bottle Apply topically once.

## 2014-01-04 NOTE — Patient Instructions (Signed)
Use saline nasal spray three sprays each nostril three times daily for one week then reduce to as needed Increase nasonex two puff ea nostril twice daily No other changes Return 3 months

## 2014-01-05 NOTE — Telephone Encounter (Signed)
Wanted to know when due to be seen by provider and have routine labs.  Also when next Prolia shot due.  She is due for 6 month visit mid December.  Made appt for her lab work (future orders placed) and appt with provider.  Prolia due 12/26, as this falls over the holiday we can give to her at time of OV.

## 2014-01-05 NOTE — Assessment & Plan Note (Signed)
Severe persistent asthma with significant atopic features now with increasing postnasal drainage and dry nares mucosa Plan Use saline nasal spray three sprays each nostril three times daily for one week then reduce to as needed Increase nasonex two puff ea nostril twice daily No other changes Return 3 months

## 2014-01-06 ENCOUNTER — Ambulatory Visit: Payer: Medicare Other | Admitting: Critical Care Medicine

## 2014-01-08 MED ORDER — OMALIZUMAB 150 MG ~~LOC~~ SOLR
150.0000 mg | Freq: Once | SUBCUTANEOUS | Status: AC
  Administered 2014-01-08: 150 mg via SUBCUTANEOUS

## 2014-01-28 ENCOUNTER — Other Ambulatory Visit: Payer: Self-pay | Admitting: *Deleted

## 2014-01-28 MED ORDER — VINATE M 27-1 MG PO TABS: 1.0000 | ORAL_TABLET | Freq: Every day | ORAL | Status: DC

## 2014-01-28 NOTE — Telephone Encounter (Signed)
Received fax requesting refill on MVI.  Refill appropriate and filled per protocol. 

## 2014-02-03 ENCOUNTER — Ambulatory Visit: Payer: Medicare Other

## 2014-02-04 ENCOUNTER — Ambulatory Visit (INDEPENDENT_AMBULATORY_CARE_PROVIDER_SITE_OTHER): Payer: Medicare Other

## 2014-02-04 ENCOUNTER — Telehealth: Payer: Self-pay | Admitting: Critical Care Medicine

## 2014-02-04 ENCOUNTER — Ambulatory Visit: Payer: Medicare Other | Admitting: Family Medicine

## 2014-02-04 ENCOUNTER — Other Ambulatory Visit (INDEPENDENT_AMBULATORY_CARE_PROVIDER_SITE_OTHER): Payer: Medicare Other | Admitting: Family Medicine

## 2014-02-04 DIAGNOSIS — J4520 Mild intermittent asthma, uncomplicated: Secondary | ICD-10-CM

## 2014-02-04 DIAGNOSIS — Z79899 Other long term (current) drug therapy: Secondary | ICD-10-CM

## 2014-02-04 DIAGNOSIS — M1120 Other chondrocalcinosis, unspecified site: Secondary | ICD-10-CM

## 2014-02-04 DIAGNOSIS — E785 Hyperlipidemia, unspecified: Secondary | ICD-10-CM

## 2014-02-04 DIAGNOSIS — M810 Age-related osteoporosis without current pathological fracture: Secondary | ICD-10-CM

## 2014-02-04 DIAGNOSIS — E039 Hypothyroidism, unspecified: Secondary | ICD-10-CM

## 2014-02-04 LAB — CBC WITH DIFFERENTIAL/PLATELET
Basophils Absolute: 0.1 10*3/uL (ref 0.0–0.1)
Basophils Relative: 1 % (ref 0–1)
Eosinophils Absolute: 0.3 10*3/uL (ref 0.0–0.7)
Eosinophils Relative: 4 % (ref 0–5)
HCT: 36.4 % (ref 36.0–46.0)
Hemoglobin: 11.8 g/dL — ABNORMAL LOW (ref 12.0–15.0)
Lymphocytes Relative: 37 % (ref 12–46)
Lymphs Abs: 2.4 10*3/uL (ref 0.7–4.0)
MCH: 32.3 pg (ref 26.0–34.0)
MCHC: 32.4 g/dL (ref 30.0–36.0)
MCV: 99.7 fL (ref 78.0–100.0)
MPV: 10.1 fL (ref 9.4–12.4)
Monocytes Absolute: 0.6 10*3/uL (ref 0.1–1.0)
Monocytes Relative: 10 % (ref 3–12)
Neutro Abs: 3.1 10*3/uL (ref 1.7–7.7)
Neutrophils Relative %: 48 % (ref 43–77)
Platelets: 224 10*3/uL (ref 150–400)
RBC: 3.65 MIL/uL — ABNORMAL LOW (ref 3.87–5.11)
RDW: 13.3 % (ref 11.5–15.5)
WBC: 6.4 10*3/uL (ref 4.0–10.5)

## 2014-02-04 LAB — COMPLETE METABOLIC PANEL WITH GFR
ALT: 32 U/L (ref 0–35)
AST: 40 U/L — ABNORMAL HIGH (ref 0–37)
Albumin: 4.3 g/dL (ref 3.5–5.2)
Alkaline Phosphatase: 29 U/L — ABNORMAL LOW (ref 39–117)
BUN: 19 mg/dL (ref 6–23)
CO2: 24 mEq/L (ref 19–32)
Calcium: 10 mg/dL (ref 8.4–10.5)
Chloride: 104 mEq/L (ref 96–112)
Creat: 1.03 mg/dL (ref 0.50–1.10)
GFR, Est African American: 66 mL/min
GFR, Est Non African American: 57 mL/min — ABNORMAL LOW
Glucose, Bld: 87 mg/dL (ref 70–99)
Potassium: 4.1 mEq/L (ref 3.5–5.3)
Sodium: 141 mEq/L (ref 135–145)
Total Bilirubin: 0.6 mg/dL (ref 0.2–1.2)
Total Protein: 6.7 g/dL (ref 6.0–8.3)

## 2014-02-04 LAB — TSH: TSH: 3.87 u[IU]/mL (ref 0.350–4.500)

## 2014-02-04 LAB — LIPID PANEL
Cholesterol: 172 mg/dL (ref 0–200)
HDL: 85 mg/dL (ref >39)
LDL Cholesterol: 68 mg/dL (ref 0–99)
Total CHOL/HDL Ratio: 2 Ratio
Triglycerides: 97 mg/dL (ref <150)
VLDL: 19 mg/dL (ref 0–40)

## 2014-02-04 MED ORDER — DENOSUMAB 60 MG/ML ~~LOC~~ SOLN
60.0000 mg | Freq: Once | SUBCUTANEOUS | Status: AC
  Administered 2014-02-04: 60 mg via SUBCUTANEOUS

## 2014-02-05 MED ORDER — OMALIZUMAB 150 MG ~~LOC~~ SOLR
150.0000 mg | Freq: Once | SUBCUTANEOUS | Status: AC
  Administered 2014-02-04: 150 mg via SUBCUTANEOUS

## 2014-02-05 NOTE — Telephone Encounter (Addendum)
Received [employer] Geophysicist/field seismologist for [employer]. Form placed in Dr. Ileana Roup folder to sign.

## 2014-02-08 ENCOUNTER — Encounter: Payer: Self-pay | Admitting: Family Medicine

## 2014-02-08 ENCOUNTER — Telehealth: Payer: Self-pay | Admitting: Critical Care Medicine

## 2014-02-08 ENCOUNTER — Ambulatory Visit (INDEPENDENT_AMBULATORY_CARE_PROVIDER_SITE_OTHER): Payer: Medicare Other | Admitting: Family Medicine

## 2014-02-08 VITALS — BP 128/76 | HR 76 | Temp 98.0°F | Resp 14 | Ht 62.0 in | Wt 113.0 lb

## 2014-02-08 DIAGNOSIS — E785 Hyperlipidemia, unspecified: Secondary | ICD-10-CM

## 2014-02-08 MED ORDER — AZITHROMYCIN 250 MG PO TABS: ORAL_TABLET | ORAL | Status: DC

## 2014-02-08 MED ORDER — OXYCODONE HCL 5 MG PO TABS: 5.0000 mg | ORAL_TABLET | ORAL | Status: DC | PRN

## 2014-02-08 NOTE — Progress Notes (Signed)
Subjective:    Patient ID: Ebony Scott, female    DOB: 1950-05-17, 63 y.o.   MRN: [id number]  HPI A she is here today for follow-up of her hyperlipidemia. She is currently on Crestor 10 mg by mouth daily. Her most recent lab work as listed below: Lab on 02/04/2014  Component Date Value Ref Range Status  . TSH 02/04/2014 3.870  0.350 - 4.500 uIU/mL Final  . Sodium 02/04/2014 141  135 - 145 mEq/L Final  . Potassium 02/04/2014 4.1  3.5 - 5.3 mEq/L Final  . Chloride 02/04/2014 104  96 - 112 mEq/L Final  . CO2 02/04/2014 24  19 - 32 mEq/L Final  . Glucose, Bld 02/04/2014 87  70 - 99 mg/dL Final  . BUN 02/04/2014 19  6 - 23 mg/dL Final  . Creat 02/04/2014 1.03  0.50 - 1.10 mg/dL Final  . Total Bilirubin 02/04/2014 0.6  0.2 - 1.2 mg/dL Final  . Alkaline Phosphatase 02/04/2014 29* 39 - 117 U/L Final  . AST 02/04/2014 40* 0 - 37 U/L Final  . ALT 02/04/2014 32  0 - 35 U/L Final  . Total Protein 02/04/2014 6.7  6.0 - 8.3 g/dL Final  . Albumin 02/04/2014 4.3  3.5 - 5.2 g/dL Final  . Calcium 02/04/2014 10.0  8.4 - 10.5 mg/dL Final  . GFR, Est African American 02/04/2014 66   Final  . GFR, Est Non African American 02/04/2014 57*  Final   Comment:   The estimated GFR is a calculation valid for adults (>=41 years old) that uses the CKD-EPI algorithm to adjust for age and sex. It is   not to be used for children, pregnant women, hospitalized patients,    patients on dialysis, or with rapidly changing kidney function. According to the NKDEP, eGFR >89 is normal, 60-89 shows mild impairment, 30-59 shows moderate impairment, 15-29 shows severe impairment and <15 is ESRD.     Marland Kitchen Cholesterol 02/04/2014 172  0 - 200 mg/dL Final   Comment: ATP III Classification:       < 200        mg/dL        Desirable      200 - 239     mg/dL        Borderline High      >= 240        mg/dL        High     . Triglycerides 02/04/2014 97  <150 mg/dL Final  . HDL 02/04/2014 85  >39 mg/dL Final  . Total CHOL/HDL  Ratio 02/04/2014 2.0   Final  . VLDL 02/04/2014 19  0 - 40 mg/dL Final  . LDL Cholesterol 02/04/2014 68  0 - 99 mg/dL Final   Comment:   Total Cholesterol/HDL Ratio:CHD Risk                        Coronary Heart Disease Risk Table                                        Men       Women          1/2 Average Risk              3.4        3.3  Average Risk              5.0        4.4           2X Average Risk              9.6        7.1           3X Average Risk             23.4       11.0 Use the calculated Patient Ratio above and the CHD Risk table  to determine the patient's CHD Risk. ATP III Classification (LDL):       < 100        mg/dL         Optimal      100 - 129     mg/dL         Near or Above Optimal      130 - 159     mg/dL         Borderline High      160 - 189     mg/dL         High       > 190        mg/dL         Very High     . WBC 02/04/2014 6.4  4.0 - 10.5 K/uL Final  . RBC 02/04/2014 3.65* 3.87 - 5.11 MIL/uL Final  . Hemoglobin 02/04/2014 11.8* 12.0 - 15.0 g/dL Final  . HCT 02/04/2014 36.4  36.0 - 46.0 % Final  . MCV 02/04/2014 99.7  78.0 - 100.0 fL Final  . MCH 02/04/2014 32.3  26.0 - 34.0 pg Final  . MCHC 02/04/2014 32.4  30.0 - 36.0 g/dL Final  . RDW 02/04/2014 13.3  11.5 - 15.5 % Final  . Platelets 02/04/2014 224  150 - 400 K/uL Final  . MPV 02/04/2014 10.1  9.4 - 12.4 fL Final  . Neutrophils Relative % 02/04/2014 48  43 - 77 % Final  . Neutro Abs 02/04/2014 3.1  1.7 - 7.7 K/uL Final  . Lymphocytes Relative 02/04/2014 37  12 - 46 % Final  . Lymphs Abs 02/04/2014 2.4  0.7 - 4.0 K/uL Final  . Monocytes Relative 02/04/2014 10  3 - 12 % Final  . Monocytes Absolute 02/04/2014 0.6  0.1 - 1.0 K/uL Final  . Eosinophils Relative 02/04/2014 4  0 - 5 % Final  . Eosinophils Absolute 02/04/2014 0.3  0.0 - 0.7 K/uL Final  . Basophils Relative 02/04/2014 1  0 - 1 % Final  . Basophils Absolute 02/04/2014 0.1  0.0 - 0.1 K/uL Final  . Smear Review 02/04/2014  Criteria for review not met   Final   Her liver function tests have risen slightly. In addition to the Crestor, she is taking Tylenol as well as naproxen on a daily basis. She is taking Tylenol and naproxen due to her chronic low back pain and neck pain. She is yet to hear from her pain clinic. Otherwise she is doing well. Her mammogram is up-to-date. Her bone density is up-to-date. Her colonoscopy is not due until 2007. Her immunizations are up-to-date. Past Medical History  Diagnosis Date  . Chronic neck pain   . Allergic rhinitis   . Moderate persistent asthma     -FeV1 72% 2011, -IgE 102 2011, CT sinus Neg 2011  . GERD (gastroesophageal reflux disease)   .  Hypothyroidism   . Osteoporosis     on reclast yearly  . Hiatal hernia   . Diverticulosis   . Hemorrhoids   . Asthma   . IBS (irritable bowel syndrome)   . Complication of anesthesia     "had hard time waking up from it several times" (02/20/2012)  . Hypercholesteremia   . Pneumonia 04/2011; ~ 11/2011    "double; single" (02/20/2012)  . Exertional dyspnea   . Graves disease   . Headache(784.0)     "related to allergies; more at different times during the year" (02/20/2012)  . Arthritis     "mostly the hands" (02/20/2012)  . Fibromyalgia 11/2011  . Chronic lower back pain   . Depression     "some; don't take anything for it" (02/20/2012)  . Breast cancer 1998    in remission  . Anxiety   . DVT (deep venous thrombosis)    Past Surgical History  Procedure Laterality Date  . Cervical fusion  2003    C3-C4  . Cervical discectomy  10/2001    C5-C6  . Vesicovaginal fistula closure w/ tah  1988  . Nasal septum surgery  1980's  . Anterior and posterior repair  1990's  . Breast lumpectomy  1998    left  . Debridement tennis elbow  ?1970's    right  . Knee arthroplasty  ?1990's    "?right; w/cartilage repair" (02/20/2012)  . Carpometacarpel ([facility]) fusion of thumb with autograft from radius  ~ 2009    "both thumbs" (02/20/2012)  .  Cataract extraction w/ intraocular lens  implant, bilateral  2012  . Tonsillectomy  ~ 1953  . Posterior cervical fusion/foraminotomy  2004    "failed initial fusion; rewired  anterior neck" (02/20/2012)   Current Outpatient Prescriptions on File Prior to Visit  Medication Sig Dispense Refill  . acetaminophen (TYLENOL ARTHRITIS PAIN) 650 MG CR tablet Take 650 mg by mouth 2 (two) times daily.     Marland Kitchen albuterol (PROAIR HFA) 108 (90 BASE) MCG/ACT inhaler Inhale 1-2 puffs into the lungs every 6 (six) hours as needed for wheezing or shortness of breath. 1 Inhaler 3  . Alpha-D-Galactosidase (BEANO) TABS Take 1 tablet by mouth daily.     . Alpha-Lipoic Acid 600 MG CAPS Take 600 mg by mouth daily. Once a day    . azelastine (ASTELIN) 0.1 % nasal spray Place 2 sprays into both nostrils daily. Use in each nostril as directed 30 mL 6  . Calcium Citrate-Vitamin D (CITRACAL MAXIMUM) 315-250 MG-UNIT TABS Take 1 tablet by mouth 2 (two) times daily.     . cetirizine (ZYRTEC) 10 MG tablet Take 10 mg by mouth daily.    . Cholecalciferol (VITAMIN D) 2000 UNITS CAPS 1/2 at bedtime    . cyproheptadine (PERIACTIN) 4 MG tablet Take 4 mg by mouth daily.     Marland Kitchen denosumab (PROLIA) 60 MG/ML SOLN injection Inject 60 mg into the skin every 6 (six) months. Administer in upper arm, thigh, or abdomen    . dexlansoprazole (DEXILANT) 60 MG capsule Take 60 mg by mouth daily.    . DULERA 100-5 MCG/ACT AERO INHALE 1 PUFF BY MOUTH TWICE DAILY 13 g 6  . EPINEPHrine (EPIPEN JR) 0.15 MG/0.3ML injection Inject 0.3 mLs (0.15 mg total) into the muscle as needed. For reaction 1 each 4  . gabapentin (NEURONTIN) 600 MG tablet Take 1 tablet by mouth daily.    . hydrocortisone (PROCTOZONE-HC) 2.5 % rectal cream Apply as needed as directed  30 g 11  . lactase (LACTAID) 3000 UNITS tablet Take 3 tablets by mouth as needed. 3-4 tabs with dairy liquids and solids    . lidocaine (LIDODERM) 5 % Place 1 patch onto the skin daily as needed. Remove & Discard  patch within 12 hours or as directed by MD 30 patch 11  . Melatonin 3 MG CAPS Take 9 mg by mouth at bedtime. 3 tabs at bedtime    . mometasone (NASONEX) 50 MCG/ACT nasal spray Place 2 sprays into the nose 2 (two) times daily. 17 g 11  . montelukast (SINGULAIR) 10 MG tablet TAKE 1 TABLET BY MOUTH ONCE DAILY 30 tablet 11  . neomycin-bacitracin-polymyxin (NEOSPORIN) ointment Apply 1 application topically every 12 (twelve) hours. Use on/in nose as directed as needed    . nystatin-triamcinolone ointment (MYCOLOG) as needed.    Marland Kitchen omalizumab (XOLAIR) 150 MG injection Inject 150 mg into the skin every 28 (twenty-eight) days. 150 SQ every 4 weeks    . Pancrelipase, Lip-Prot-Amyl, (CREON) 24000 UNITS CPEP TAKE 1 CAPSULE BY MOUTH DAILY 3-4 TIMES DAILY(WITH EACH MEAL) 180 each 11  . Polyethyl Glycol-Propyl Glycol (SYSTANE ULTRA) 0.4-0.3 % SOLN Apply 1 drop to eye 2 (two) times daily as needed. 1-2 drops each eye every morning and at bedtime    . Prenatal Vit-Sel-Fe Fum-FA (VINATE M) 27-1 MG TABS Take 1 tablet by mouth daily. 30 each 11  . Probiotic Product (ALIGN) 4 MG CAPS Take 1 tablet by mouth daily. Once a day    . propylthiouracil (PTU) 50 MG tablet take 1/2 tablet (25 mg) twice daily 30 tablet 5  . pseudoephedrine-guaifenesin (MUCINEX D) 60-600 MG per tablet Take 1 tablet by mouth every 12 (twelve) hours. 60 tablet 3  . rosuvastatin (CRESTOR) 10 MG tablet TAKE 1 TABLET BY MOUTH DAILY 30 tablet 11  . Simethicone (GAS-X EXTRA STRENGTH) 125 MG CAPS Take 2 each by mouth daily as needed. For gas relief    . [DISCONTINUED] methimazole (TAPAZOLE) 5 MG tablet Take 5 mg by mouth 3 (three) times daily.       No current facility-administered medications on file prior to visit.   Allergies  Allergen Reactions  . Dust Mite Extract Shortness Of Breath and Other (See Comments)    "sneezing" (02/20/2012)  . Molds & Smuts Shortness Of Breath  . Morphine Sulfate Itching  . Other Shortness Of Breath and Other (See  Comments)    Grass and weeds "sneezing; filled sinuses" (02/20/2012)  . Penicillins Rash and Other (See Comments)    "welts" (02/20/2012)  . Reclast [Zoledronic Acid]     Put in hospital  . Rofecoxib Swelling    Celebrex REACTION: feet swelling  . Shrimp Flavor Anaphylaxis    ALL SHELLFISH  . Tetracycline Hcl Nausea And Vomiting  . Dilaudid [Hydromorphone Hcl] Itching  . Hydrocodone-Acetaminophen Nausea And Vomiting  . Levofloxacin Other (See Comments)    REACTION: GI upset  . Oxycodone Hcl Nausea And Vomiting  . Paroxetine Nausea And Vomiting    Paxil   . Soy Allergy Other (See Comments)    Limited soy d/t "ESTROGEN POSITIVE" cancer  . Tree Extract Other (See Comments)    "tested and told I was allergic to it; never experienced a reaction to it" (02/20/2012)   History   Social History  . Marital Status: Divorced    Spouse Name: N/A    Number of Children: 2  . Years of Education: college   Occupational History  . Disabled  Retired Print production planner  .     Social History Main Topics  . Smoking status: Never Smoker   . Smokeless tobacco: Never Used  . Alcohol Use: 0.0 oz/week     Comment: 02/20/2012 "couple glasses of wine/6 months"  . Drug Use: No  . Sexual Activity: No   Other Topics Concern  . Not on file   Social History Narrative   Patient lives at home alone. Patient  divorced.    Patient has her BS degree.   Right handed.   Caffeine- sometimes coffee.               Review of Systems  All other systems reviewed and are negative.      Objective:   Physical Exam  Constitutional: She appears well-developed and well-nourished.  Cardiovascular: Normal rate, regular rhythm, normal heart sounds and intact distal pulses.   No murmur heard. Pulmonary/Chest: Effort normal and breath sounds normal. No respiratory distress. She has no wheezes. She has no rales.  Abdominal: Soft. Bowel sounds are normal. She exhibits no distension. There is no  tenderness. There is no rebound and no guarding.  Musculoskeletal: She exhibits no edema.  Vitals reviewed.         Assessment & Plan:  HLD (hyperlipidemia)  I believe the slight elevation in her liver function tests is due to polypharmacy from accommodation of the Crestor, Tylenol, naproxen. I recommended she decrease the Crestor to 5 mg a day. I would also like her to try to reduce her use of Tylenol as much as possible. I did give the patient prescription for oxycodone 5 mg to be taken every 6 hours as needed for severe pain. I gave her 30 tablets with 0 refills until she can get in to see the pain clinic. Recheck fasting lab work in 3 months. Patient also appears to have a viral upper respiratory infection. I cautioned the patient to allow tincture of time. I do not believe she needs antibiotics at this point. I did give her a prescription for a Z-Pak given the fact is the Christmas holiday in case her symptoms worsen but I explained to the patient she is not to take that yet

## 2014-02-08 NOTE — Telephone Encounter (Signed)
Dr. Joya Gaskins, Received faxed letter request from pt.  She is requesting a letter recommending that she close in the back of her home to stop the intrusion of mold that grows in that area.  She has attached a sample letter request.  Please review and advise if you are able to provide this.  Thank you.

## 2014-02-08 NOTE — Telephone Encounter (Signed)
Spoke with pt.  Calling to let us know she faxed a letter over the weekend regarding mold and having her porch closed in.  She would like this to be reviewed by Dr. Joya Gaskins.  Pt aware I will him review letter once received.  She verbalized understanding.

## 2014-02-08 NOTE — Telephone Encounter (Signed)
Spoke with pt.  She will refax the sample letter.  Will await fax.

## 2014-02-08 NOTE — Telephone Encounter (Signed)
Nasonex pt assistance application placed in the mail to: [place] [address]. This was mailed per pt's request.  Pt aware.

## 2014-02-08 NOTE — Telephone Encounter (Signed)
i am ok with the letter and can sign if it is produced

## 2014-02-09 ENCOUNTER — Encounter: Payer: Self-pay | Admitting: *Deleted

## 2014-02-09 NOTE — Telephone Encounter (Signed)
Pt calling stating that she sent in info requested and mad some revisions to it.Ebony Scott

## 2014-02-09 NOTE — Telephone Encounter (Signed)
Letter composed.  Will have PW review and sign when he returns to office next wk.  Once completed, will place in mail to pt's verified home address per pt's request.

## 2014-02-15 NOTE — Telephone Encounter (Signed)
Pt sheduled for OV with PW 04/19/14 at [place] Pt aware that PW not back in office until 02/17/14 - will place in mail as soon as it is signed. Pt expressed understanding. Will send to [place] as FYI. Nothing further needed.

## 2014-02-15 NOTE — Telephone Encounter (Signed)
Pt is calling back about a letter needs a hardcopy    [phone]

## 2014-02-17 MED ORDER — EPINEPHRINE 0.3 MG/0.3ML IJ SOAJ: 0.3000 mg | Freq: Once | INTRAMUSCULAR | Status: DC

## 2014-02-17 NOTE — Telephone Encounter (Addendum)
Letter signed by Dr. Joya Gaskins.  I have placed this in the mail.  Pt aware.  Pt requesting refill for epipen to [place].  Epipen Jr on pt's med list.  Rx should be as Epipen 0.3mg  (ok'd per Dr. Joya Gaskins).  Rx sent -- pt aware and voiced no further questions or concerns at this time.

## 2014-02-17 NOTE — Addendum Note (Signed)
Addended by: Raymondo Band D on: 02/17/2014 10:46 AM   Modules accepted: Orders, Medications

## 2014-02-22 ENCOUNTER — Other Ambulatory Visit: Payer: Self-pay | Admitting: Family Medicine

## 2014-02-22 MED ORDER — PSEUDOEPHEDRINE-GUAIFENESIN ER 60-600 MG PO TB12: 1.0000 | ORAL_TABLET | Freq: Two times a day (BID) | ORAL | Status: DC

## 2014-02-22 MED ORDER — PROPYLTHIOURACIL 50 MG PO TABS: ORAL_TABLET | ORAL | Status: DC

## 2014-02-22 MED ORDER — VINATE M 27-1 MG PO TABS: 1.0000 | ORAL_TABLET | Freq: Every day | ORAL | Status: DC

## 2014-02-22 NOTE — Telephone Encounter (Signed)
Meds requested sent to pharm

## 2014-03-01 ENCOUNTER — Telehealth: Payer: Self-pay | Admitting: Family Medicine

## 2014-03-01 NOTE — Telephone Encounter (Signed)
[phone] PT was referred to one pain clinic that she has been to years ago, and they called her and told her that they would not take her as a pt and she is needing to be referred to another pain clinic

## 2014-03-02 ENCOUNTER — Telehealth: Payer: Self-pay | Admitting: Family Medicine

## 2014-03-02 NOTE — Telephone Encounter (Signed)
I called patient back after seeing the summary of benefits that her insurance faxed to [place] on 01/22/2014. It states on here that he patients responsibility would be $50 for one syringe of prolia $0 office visit and $10 Administration fee. Patient wanted to make sure I wasn't using her insurance information that she sent into [place] on 02/22/14. I tried to explain to the patient that we submit the pre-authorization prior to her getting the shot and the summary of benefit was received back to our office on 01/22/14. She said it was ridiculous that she had to pay the $10 for the administration fee but since she owed it she would pay it. She thought we were trying to say she didn't pay copay at her last visit. I tried to explain to her no the $10 that was due was on this summary of benefit and it was the administration fee.

## 2014-03-02 NOTE — Telephone Encounter (Signed)
Referred to [facility] they work EPIC wq, and contacted pt to inform her of referral sent to someone else.

## 2014-03-02 NOTE — Telephone Encounter (Signed)
Patient is calling to speak with you regarding her bill she says she does not owe the copay for the prolia shot she received  [phone]

## 2014-03-03 ENCOUNTER — Encounter: Payer: Self-pay | Admitting: Family Medicine

## 2014-03-04 ENCOUNTER — Telehealth: Payer: Self-pay | Admitting: Critical Care Medicine

## 2014-03-04 ENCOUNTER — Ambulatory Visit (INDEPENDENT_AMBULATORY_CARE_PROVIDER_SITE_OTHER): Payer: Medicare Other

## 2014-03-04 DIAGNOSIS — J4550 Severe persistent asthma, uncomplicated: Secondary | ICD-10-CM | POA: Diagnosis not present

## 2014-03-04 NOTE — Telephone Encounter (Signed)
Received fax for PA for [facility]. Completed form and faxed back ( [phone]) to initiate a PA for [facility] as this is for continuation of therapy and pt has tried and failed Symbicort.   Will forward to Crystal follow.

## 2014-03-05 MED ORDER — OMALIZUMAB 150 MG ~~LOC~~ SOLR
150.0000 mg | Freq: Once | SUBCUTANEOUS | Status: AC
  Administered 2014-03-04: 150 mg via SUBCUTANEOUS

## 2014-03-05 NOTE — Telephone Encounter (Signed)
Received APPROVAL letter for [facility] - placed in scan folder. Approved through 03/05/2015 under Medicare Part D Pt and [facility] pharm aware of approval. Pt is to let office know if she has trouble when trying to pick up next rx from pharm.

## 2014-03-13 IMAGING — CR DG FOOT COMPLETE 3+V*R*
3 series · 3 of 3 positions shown · non-contrast
Comparison: None.

CLINICAL DATA: Pain at first metatarsal phalangeal joint.  No known
injury.

RIGHT FOOT COMPLETE - 3+ VIEW

[view not recorded (1 of 3)]
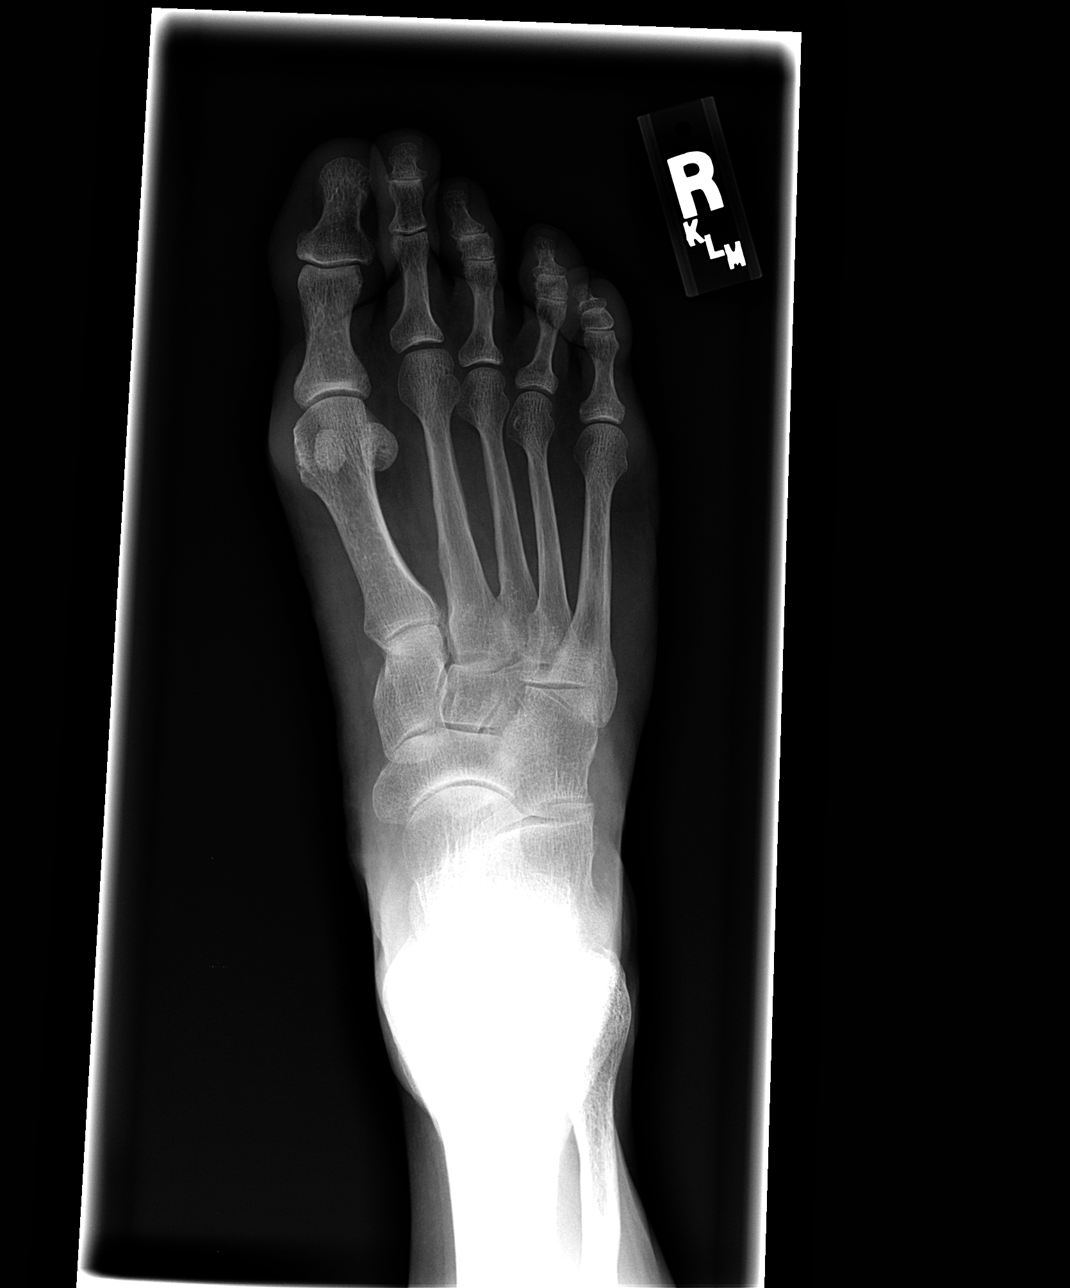

[view not recorded (2 of 3)]
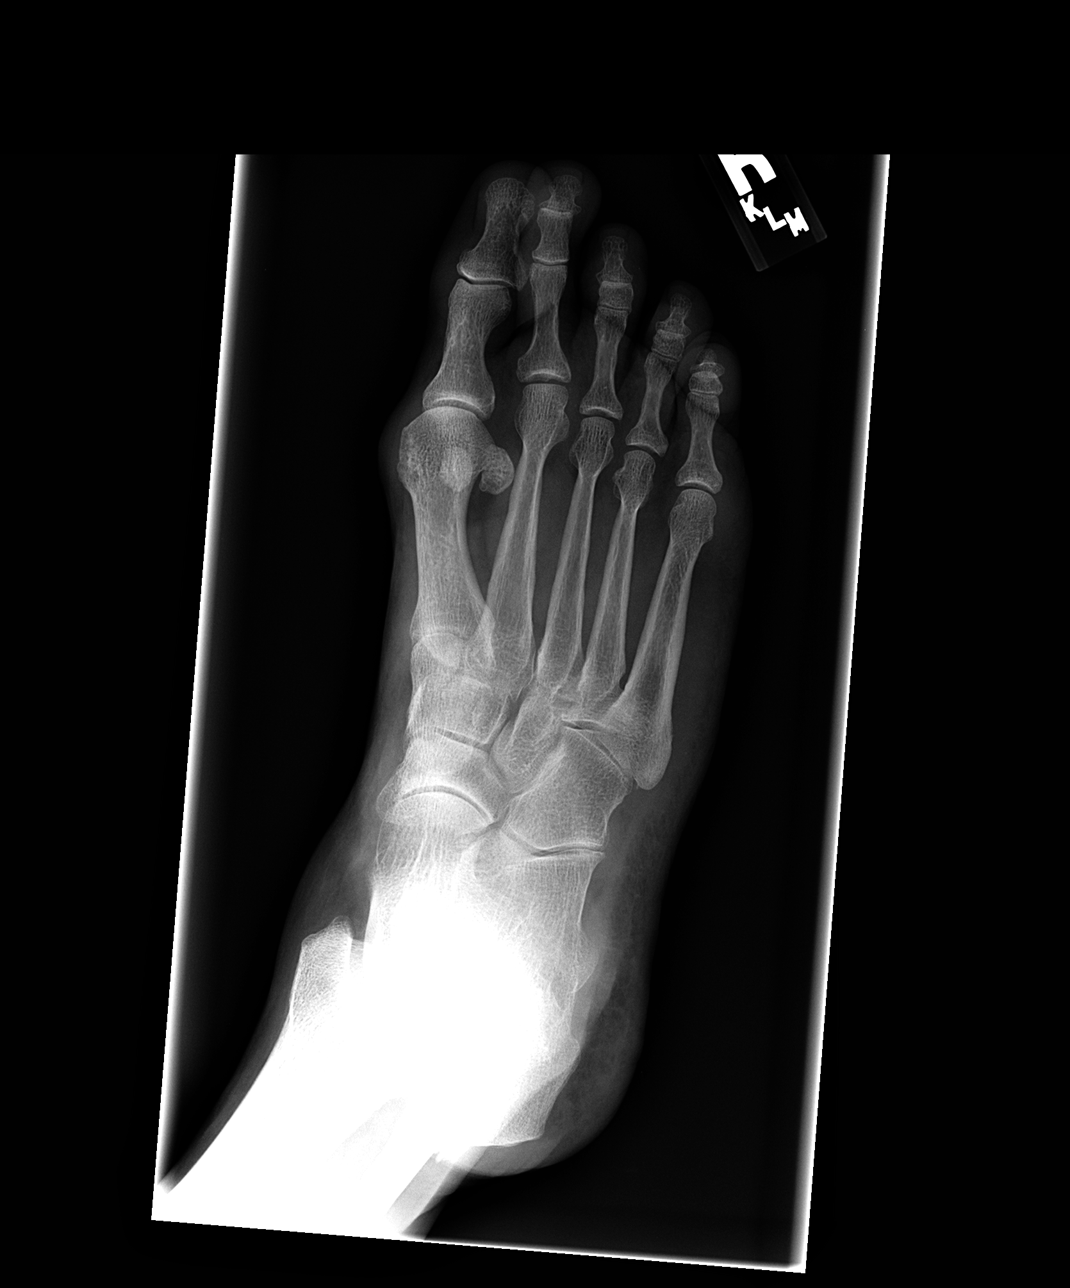

[view not recorded (3 of 3)]
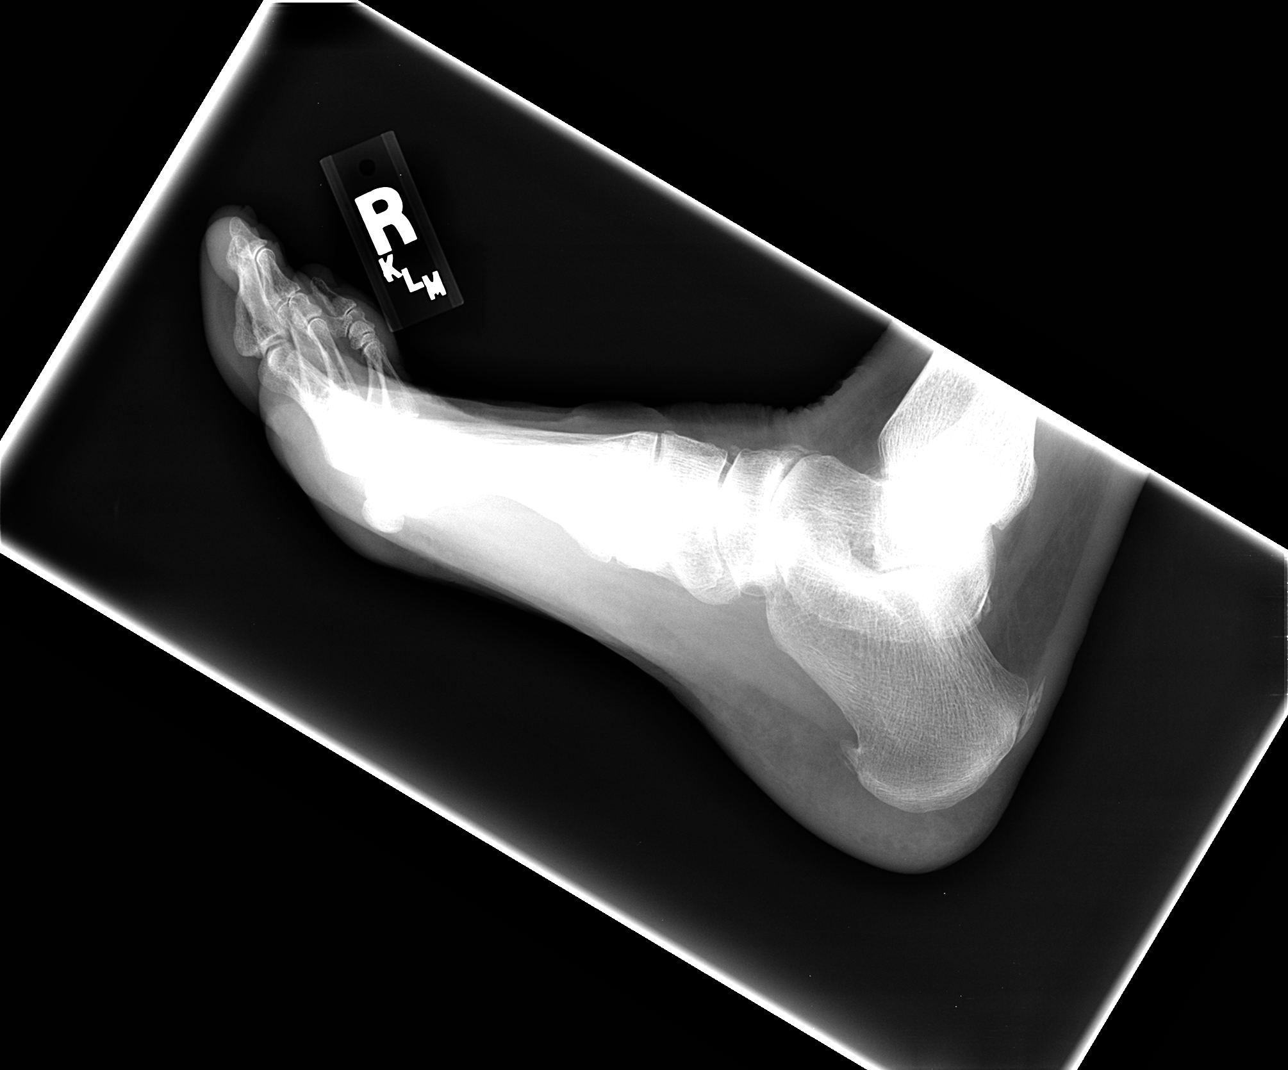

[3 of 3 positions shown; findings below may reference images not displayed]

FINDINGS: The mineralization and alignment are normal.  There is no
evidence of acute fracture or dislocation.  There are mild
degenerative changes of the first metatarsal phalangeal joint.
There is medial soft tissue prominence which may represent early
bunion formation.  There is no significant hallux valgus deformity.
There are small calcaneal spurs.
IMPRESSION: Mild degenerative changes at the first metatarsal phalangeal joint.
No acute osseous findings.

## 2014-03-17 ENCOUNTER — Telehealth: Payer: Self-pay | Admitting: Critical Care Medicine

## 2014-03-17 NOTE — Telephone Encounter (Signed)
Sample is up front for pick up. Pt is aware.

## 2014-03-22 ENCOUNTER — Telehealth: Payer: Self-pay | Admitting: Family Medicine

## 2014-03-22 NOTE — Telephone Encounter (Signed)
Patient would like to ask some questions about a stomach virus she has  [phone]

## 2014-03-23 NOTE — Telephone Encounter (Signed)
Called pt and she had spoken to [facility] yesterday and was advised on the virus.

## 2014-03-24 ENCOUNTER — Telehealth: Payer: Self-pay | Admitting: Family Medicine

## 2014-03-24 MED ORDER — COLCHICINE 0.6 MG PO TABS: 0.6000 mg | ORAL_TABLET | Freq: Every day | ORAL | Status: DC

## 2014-03-24 NOTE — Telephone Encounter (Signed)
Med sent to pharm 

## 2014-03-26 ENCOUNTER — Telehealth: Payer: Self-pay | Admitting: Critical Care Medicine

## 2014-03-26 MED ORDER — MOMETASONE FUROATE 50 MCG/ACT NA SUSP: 2.0000 | Freq: Two times a day (BID) | NASAL | Status: DC

## 2014-03-26 NOTE — Telephone Encounter (Signed)
There was a fax that was also sent over from the company

## 2014-03-26 NOTE — Telephone Encounter (Signed)
Spoke with pt, states the pharmacy called her stating that the date on her rx was questionable if it was from December 2015 or 2016 and needed this resent to them.  Fax and order number listed below.  States requests for this have been faxed to our office with no response.  I have printed this and put it in PWs look at for PW to sign and to be faxed back.  Forwarding to Crystal to follow up on.

## 2014-03-29 NOTE — Telephone Encounter (Signed)
Signed nasonex rx faxed to pt assistance.  Pt aware.

## 2014-03-31 ENCOUNTER — Ambulatory Visit: Payer: Medicare Other | Admitting: Critical Care Medicine

## 2014-03-31 ENCOUNTER — Encounter: Payer: Self-pay | Admitting: Physician Assistant

## 2014-03-31 ENCOUNTER — Ambulatory Visit (INDEPENDENT_AMBULATORY_CARE_PROVIDER_SITE_OTHER): Payer: 59 | Admitting: Physician Assistant

## 2014-03-31 VITALS — BP 114/66 | HR 80 | Temp 98.1°F | Resp 18 | Wt 108.0 lb

## 2014-03-31 DIAGNOSIS — R197 Diarrhea, unspecified: Secondary | ICD-10-CM

## 2014-03-31 DIAGNOSIS — K589 Irritable bowel syndrome without diarrhea: Secondary | ICD-10-CM

## 2014-03-31 DIAGNOSIS — R110 Nausea: Secondary | ICD-10-CM

## 2014-03-31 DIAGNOSIS — R5383 Other fatigue: Secondary | ICD-10-CM

## 2014-03-31 LAB — URINALYSIS, ROUTINE W REFLEX MICROSCOPIC
Bilirubin Urine: NEGATIVE
Glucose, UA: NEGATIVE mg/dL
Hgb urine dipstick: NEGATIVE
Ketones, ur: 15 mg/dL — AB
Leukocytes, UA: NEGATIVE
Nitrite: NEGATIVE
Protein, ur: NEGATIVE mg/dL
Specific Gravity, Urine: 1.01 (ref 1.005–1.030)
Urobilinogen, UA: 0.2 mg/dL (ref 0.0–1.0)
pH: 5.5 (ref 5.0–8.0)

## 2014-03-31 MED ORDER — PROMETHAZINE HCL 25 MG PO TABS: 25.0000 mg | ORAL_TABLET | Freq: Three times a day (TID) | ORAL | Status: DC | PRN

## 2014-03-31 NOTE — Progress Notes (Signed)
Patient ID: Ebony Scott MRN: [id number], DOB: January 02, 1951, 64 y.o. Date of Encounter: @DATE @  Chief Complaint:  Chief Complaint  Patient presents with  . sick x 13 days    bouts of nausea, diarrhea, fever    HPI: 64 y.o. year old female  presents with above complaints.   Says in the beginning of this illness she had some diarrhea. At that point started eating just chicken noodle soup and crackers and applesauce. The diarrhea resolved. However during this whole time she has felt very fatigued with very little energy. "Just hasn't felt well." Says that she does have an appetite and wants to eat but then is afraid to eat much because is afraid she will feel sick. Has had some nausea but the nausea has not been really bad. Has had no vomiting. Got tired of eating nothing but chicken noodle soup crackers etc. so then started eating some other foods. She has been keeping a log of all of this reviews those notes to tell me today. Says that when she started eating some chicken green beans and carrots then this when the diarrhea started to come back again. Also having a lot of gas.   Past Medical History  Diagnosis Date  . Chronic neck pain   . Allergic rhinitis   . Moderate persistent asthma     -FeV1 72% 2011, -IgE 102 2011, CT sinus Neg 2011  . GERD (gastroesophageal reflux disease)   . Hypothyroidism   . Osteoporosis     on reclast yearly  . Hiatal hernia   . Diverticulosis   . Hemorrhoids   . Asthma   . IBS (irritable bowel syndrome)   . Complication of anesthesia     "had hard time waking up from it several times" (02/20/2012)  . Hypercholesteremia   . Pneumonia 04/2011; ~ 11/2011    "double; single" (02/20/2012)  . Exertional dyspnea   . Graves disease   . Headache(784.0)     "related to allergies; more at different times during the year" (02/20/2012)  . Arthritis     "mostly the hands" (02/20/2012)  . Fibromyalgia 11/2011  . Chronic lower back pain   . Depression     "some;  don't take anything for it" (02/20/2012)  . Breast cancer 1998    in remission  . Anxiety   . DVT (deep venous thrombosis)      Home Meds: Outpatient Prescriptions Prior to Visit  Medication Sig Dispense Refill  . acetaminophen (TYLENOL ARTHRITIS PAIN) 650 MG CR tablet Take 650 mg by mouth 2 (two) times daily.     Marland Kitchen albuterol (PROAIR HFA) 108 (90 BASE) MCG/ACT inhaler Inhale 1-2 puffs into the lungs every 6 (six) hours as needed for wheezing or shortness of breath. 1 Inhaler 3  . Alpha-D-Galactosidase (BEANO) TABS Take 1 tablet by mouth daily.     . Alpha-Lipoic Acid 600 MG CAPS Take 600 mg by mouth daily. Once a day    . azelastine (ASTELIN) 0.1 % nasal spray Place 2 sprays into both nostrils daily. Use in each nostril as directed 30 mL 6  . azithromycin (ZITHROMAX) 250 MG tablet 2 tabs poqday1, 1 tab poqday 2-5 6 tablet 0  . Calcium Citrate-Vitamin D (CITRACAL MAXIMUM) 315-250 MG-UNIT TABS Take 1 tablet by mouth 2 (two) times daily.     . cetirizine (ZYRTEC) 10 MG tablet Take 10 mg by mouth daily.    . Cholecalciferol (VITAMIN D) 2000 UNITS CAPS 1/2 at bedtime    .  colchicine 0.6 MG tablet Take 1 tablet (0.6 mg total) by mouth daily. 30 tablet 1  . cyproheptadine (PERIACTIN) 4 MG tablet Take 4 mg by mouth daily.     Marland Kitchen denosumab (PROLIA) 60 MG/ML SOLN injection Inject 60 mg into the skin every 6 (six) months. Administer in upper arm, thigh, or abdomen    . dexlansoprazole (DEXILANT) 60 MG capsule Take 60 mg by mouth daily.    . DULERA 100-5 MCG/ACT AERO INHALE 1 PUFF BY MOUTH TWICE DAILY 13 g 6  . EPINEPHrine (EPIPEN 2-PAK) 0.3 mg/0.3 mL IJ SOAJ injection Inject 0.3 mLs (0.3 mg total) into the muscle once. 1 Device 4  . gabapentin (NEURONTIN) 600 MG tablet Take 1 tablet by mouth daily.    . hydrocortisone (PROCTOZONE-HC) 2.5 % rectal cream Apply as needed as directed 30 g 11  . lactase (LACTAID) 3000 UNITS tablet Take 3 tablets by mouth as needed. 3-4 tabs with dairy liquids and solids      . lidocaine (LIDODERM) 5 % Place 1 patch onto the skin daily as needed. Remove & Discard patch within 12 hours or as directed by MD 30 patch 11  . Melatonin 3 MG CAPS Take 9 mg by mouth at bedtime. 3 tabs at bedtime    . mometasone (NASONEX) 50 MCG/ACT nasal spray Place 2 sprays into the nose 2 (two) times daily. 17 g 11  . montelukast (SINGULAIR) 10 MG tablet TAKE 1 TABLET BY MOUTH ONCE DAILY 30 tablet 11  . neomycin-bacitracin-polymyxin (NEOSPORIN) ointment Apply 1 application topically every 12 (twelve) hours. Use on/in nose as directed as needed    . nystatin-triamcinolone ointment (MYCOLOG) as needed.    Marland Kitchen omalizumab (XOLAIR) 150 MG injection Inject 150 mg into the skin every 28 (twenty-eight) days. 150 SQ every 4 weeks    . oxyCODONE (ROXICODONE) 5 MG immediate release tablet Take 1 tablet (5 mg total) by mouth every 4 (four) hours as needed for severe pain. 30 tablet 0  . Pancrelipase, Lip-Prot-Amyl, (CREON) 24000 UNITS CPEP TAKE 1 CAPSULE BY MOUTH DAILY 3-4 TIMES DAILY(WITH EACH MEAL) 180 each 11  . Polyethyl Glycol-Propyl Glycol (SYSTANE ULTRA) 0.4-0.3 % SOLN Apply 1 drop to eye 2 (two) times daily as needed. 1-2 drops each eye every morning and at bedtime    . Prenatal Vit-Sel-Fe Fum-FA (VINATE M) 27-1 MG TABS Take 1 tablet by mouth daily. 30 each 11  . Probiotic Product (ALIGN) 4 MG CAPS Take 1 tablet by mouth daily. Once a day    . propylthiouracil (PTU) 50 MG tablet take 1/2 tablet (25 mg) twice daily 30 tablet 5  . pseudoephedrine-guaifenesin (MUCINEX D) 60-600 MG per tablet Take 1 tablet by mouth every 12 (twelve) hours. 60 tablet 3  . rosuvastatin (CRESTOR) 10 MG tablet TAKE 1 TABLET BY MOUTH DAILY 30 tablet 11  . Simethicone (GAS-X EXTRA STRENGTH) 125 MG CAPS Take 2 each by mouth daily as needed. For gas relief     No facility-administered medications prior to visit.    Allergies:  Allergies  Allergen Reactions  . Dust Mite Extract Shortness Of Breath and Other (See  Comments)    "sneezing" (02/20/2012)  . Molds & Smuts Shortness Of Breath  . Morphine Sulfate Itching  . Other Shortness Of Breath and Other (See Comments)    Grass and weeds "sneezing; filled sinuses" (02/20/2012)  . Penicillins Rash and Other (See Comments)    "welts" (02/20/2012)  . Reclast [Zoledronic Acid]     Put  in hospital  . Rofecoxib Swelling    Celebrex REACTION: feet swelling  . Shrimp Flavor Anaphylaxis    ALL SHELLFISH  . Tetracycline Hcl Nausea And Vomiting  . Dilaudid [Hydromorphone Hcl] Itching  . Hydrocodone-Acetaminophen Nausea And Vomiting  . Levofloxacin Other (See Comments)    REACTION: GI upset  . Oxycodone Hcl Nausea And Vomiting  . Paroxetine Nausea And Vomiting    Paxil   . Soy Allergy Other (See Comments)    Limited soy d/t "ESTROGEN POSITIVE" cancer  . Tree Extract Other (See Comments)    "tested and told I was allergic to it; never experienced a reaction to it" (02/20/2012)    History   Social History  . Marital Status: Divorced    Spouse Name: N/A  . Number of Children: 2  . Years of Education: college   Occupational History  . Disabled     Retired Print production planner  .     Social History Main Topics  . Smoking status: Never Smoker   . Smokeless tobacco: Never Used  . Alcohol Use: 0.0 oz/week     Comment: 02/20/2012 "couple glasses of wine/6 months"  . Drug Use: No  . Sexual Activity: No   Other Topics Concern  . Not on file   Social History Narrative   Patient lives at home alone. Patient  divorced.    Patient has her BS degree.   Right handed.   Caffeine- sometimes coffee.             Family History  Problem Relation Age of Onset  . Allergies Mother   . Heart disease Mother   . Arthritis Mother   . Lung cancer Mother   . Allergies Father   . Heart disease Father   . Arthritis Father   . Colon cancer      Maternal half aunt/Maternal half uncle  . Colitis Daughter      Review of Systems:  See HPI for  pertinent ROS. All other ROS negative.    Physical Exam: Blood pressure 114/66, pulse 80, temperature 98.1 F (36.7 C), temperature source Oral, resp. rate 18, weight 108 lb (48.988 kg)., Body mass index is 19.75 kg/(m^2). General: WNWD WF. Appears in no acute distress. Neck: Supple. No thyromegaly. No lymphadenopathy. Lungs: Clear bilaterally to auscultation without wheezes, rales, or rhonchi. Breathing is unlabored. Heart: RRR with S1 S2. No murmurs, rubs, or gallops. Abdomen: Soft,  non-distended with normoactive bowel sounds. No hepatomegaly. No rebound/guarding. No obvious abdominal masses.VERY MINIMAL tenderness with palpation --just left of and superior to--umbilicus. NO other area of tenderness.  Musculoskeletal:  Strength and tone normal for age. Extremities/Skin: Warm and dry.  Neuro: Alert and oriented X 3. Moves all extremities spontaneously. Gait is normal. CNII-XII grossly in tact. Psych:  Responds to questions appropriately with a normal affect.     ASSESSMENT AND PLAN:  64 y.o. year old female with  1. Diarrhea - CBC with Differential/Platelet - COMPLETE METABOLIC PANEL WITH GFR - TSH - H. pylori breath test - Celiac panel 10  2. Nausea without vomiting - CBC with Differential/Platelet - COMPLETE METABOLIC PANEL WITH GFR - TSH - H. pylori breath test - Celiac panel 10 - promethazine (PHENERGAN) 25 MG tablet; Take 1 tablet (25 mg total) by mouth every 8 (eight) hours as needed for nausea or vomiting.  Dispense: 30 tablet; Refill: 0 - Urinalysis, Routine w reflex microscopic  3. Other fatigue - CBC with Differential/Platelet - COMPLETE METABOLIC PANEL  WITH GFR - TSH - H. pylori breath test - Celiac panel 10 - Urinalysis, Routine w reflex microscopic  4. Irritable bowel syndrome  She has history of irritable bowel syndrome. He has a very long history of medical problems. He has a very long list of medications. She has a very long list of allergy  issues  On her current chronic medicine list she is already on Dexilant and is on align probiotic. Also on her list are Lactaid, Gas-X, and Beano. We'll check labs to check for other types of medical problems. Otherwise this may just be a flare of her IBS. Will follow up with her once we get lab results. In the meantime she can use Phenergan as needed for nausea and can use over-the-counter Imodium as needed for diarrhea. I have told her to stay with chicken noodle soup,  ginger ale. Avoid heavy foods that her system has to digest.   Signed, [address] [place], [place], [facility] 03/31/2014 4:01 PM

## 2014-04-01 ENCOUNTER — Telehealth: Payer: Self-pay | Admitting: Family Medicine

## 2014-04-01 DIAGNOSIS — R197 Diarrhea, unspecified: Secondary | ICD-10-CM

## 2014-04-01 DIAGNOSIS — K589 Irritable bowel syndrome without diarrhea: Secondary | ICD-10-CM

## 2014-04-01 LAB — CBC WITH DIFFERENTIAL/PLATELET
Basophils Absolute: 0 10*3/uL (ref 0.0–0.1)
Basophils Relative: 0 % (ref 0–1)
Eosinophils Absolute: 0.2 10*3/uL (ref 0.0–0.7)
Eosinophils Relative: 2 % (ref 0–5)
HCT: 35.2 % — ABNORMAL LOW (ref 36.0–46.0)
Hemoglobin: 11.5 g/dL — ABNORMAL LOW (ref 12.0–15.0)
Lymphocytes Relative: 24 % (ref 12–46)
Lymphs Abs: 2 10*3/uL (ref 0.7–4.0)
MCH: 32 pg (ref 26.0–34.0)
MCHC: 32.7 g/dL (ref 30.0–36.0)
MCV: 98.1 fL (ref 78.0–100.0)
MPV: 10.6 fL (ref 8.6–12.4)
Monocytes Absolute: 0.8 10*3/uL (ref 0.1–1.0)
Monocytes Relative: 9 % (ref 3–12)
Neutro Abs: 5.5 10*3/uL (ref 1.7–7.7)
Neutrophils Relative %: 65 % (ref 43–77)
Platelets: 249 10*3/uL (ref 150–400)
RBC: 3.59 MIL/uL — ABNORMAL LOW (ref 3.87–5.11)
RDW: 12.7 % (ref 11.5–15.5)
WBC: 8.5 10*3/uL (ref 4.0–10.5)

## 2014-04-01 LAB — COMPLETE METABOLIC PANEL WITH GFR
ALT: 27 U/L (ref 0–35)
AST: 29 U/L (ref 0–37)
Albumin: 4.2 g/dL (ref 3.5–5.2)
Alkaline Phosphatase: 35 U/L — ABNORMAL LOW (ref 39–117)
BUN: 16 mg/dL (ref 6–23)
CO2: 23 mEq/L (ref 19–32)
Calcium: 9.7 mg/dL (ref 8.4–10.5)
Chloride: 105 mEq/L (ref 96–112)
Creat: 1.01 mg/dL (ref 0.50–1.10)
GFR, Est African American: 67 mL/min
GFR, Est Non African American: 58 mL/min — ABNORMAL LOW
Glucose, Bld: 89 mg/dL (ref 70–99)
Potassium: 4.4 mEq/L (ref 3.5–5.3)
Sodium: 141 mEq/L (ref 135–145)
Total Bilirubin: 0.6 mg/dL (ref 0.2–1.2)
Total Protein: 6.6 g/dL (ref 6.0–8.3)

## 2014-04-01 LAB — CELIAC PANEL 10
Endomysial Screen: NEGATIVE
Gliadin IgA: 3 Units (ref <20)
Gliadin IgG: 2 Units (ref <20)
IgA: 136 mg/dL (ref 69–380)
Tissue Transglut Ab: 1 U/mL (ref <6)
Tissue Transglutaminase Ab, IgA: 1 U/mL (ref <4)

## 2014-04-01 LAB — H. PYLORI BREATH TEST: H. pylori Breath Test: NOT DETECTED

## 2014-04-01 LAB — TSH: TSH: 1.527 u[IU]/mL (ref 0.350–4.500)

## 2014-04-01 MED ORDER — LIDOCAINE 5 % EX PTCH: 1.0000 | MEDICATED_PATCH | Freq: Every day | CUTANEOUS | Status: DC | PRN

## 2014-04-01 MED ORDER — ELUXADOLINE 100 MG PO TABS: 1.0000 | ORAL_TABLET | Freq: Two times a day (BID) | ORAL | Status: DC

## 2014-04-01 NOTE — Telephone Encounter (Signed)
rx sent

## 2014-04-01 NOTE — Telephone Encounter (Signed)
-----   Message from Orlena Sheldon, PA-C sent at 04/01/2014  2:51 PM EST ----- Anemia stable. All other labs normal. She has a history of irritable bowel syndrome documented in her chart. Her symptoms may just be a flare of this IBS. There is a new prescription medication that can be used for diarrhea predominant IBS. Can see if patient wants to try using this medication.-- Viberzi 100mg  1 po BID  # 60 +2 If she does not want to try this prescription medication,  she can simply continue using over-the-counter medicines as needed for the diarrhea and continue ginger ale and chicken noodle soup type foods for symptom management.

## 2014-04-01 NOTE — Telephone Encounter (Signed)
Pt aware of lab results.  Does want to try new medication.  Rx to pharmacy.

## 2014-04-01 NOTE — Telephone Encounter (Signed)
Patient is calling to see if blood work results were in  [phone]

## 2014-04-01 NOTE — Telephone Encounter (Signed)
Ordered aptient last year Lidoderm patches for her back pain.  Insurance would not approve (condition did not warrant medication) and was not on formulary.  Today states, per her insurance, is now on formulary.  Wants an new Rx sent for the Lidoderm patches.

## 2014-04-01 NOTE — Telephone Encounter (Signed)
Ok please send RX.

## 2014-04-02 ENCOUNTER — Telehealth: Payer: Self-pay | Admitting: Family Medicine

## 2014-04-02 NOTE — Telephone Encounter (Signed)
Pt told by insurance Viberzi denied and if approved cost would $1,000.  They recommended Amitiza or Linzess.  Pt cost only $35.  Pt prefers Linzess due to once daily dose.

## 2014-04-02 NOTE — Telephone Encounter (Signed)
rec'd PA request for Viberzi.  Request submitted through "CoverMyMeds"  Case [id number].

## 2014-04-02 NOTE — Telephone Encounter (Signed)
rx printed instead of going escribed, called into pharmacy this morning and pt made aware

## 2014-04-02 NOTE — Telephone Encounter (Signed)
Patient is calling to let you know that the rx that you discussed and needed to be called in yesterday, was not at pharmacy please call her back at [phone]

## 2014-04-02 NOTE — Telephone Encounter (Signed)
rec'd DENIAL for the Lidoderm patches.  Insurance states patient does not have a qualifying diagnosis.  Pt made aware.  Pharmacy made aware.

## 2014-04-02 NOTE — Telephone Encounter (Signed)
rec'd PA request for Lidoderm patches.  Has been submitted through "CoverMyMeds"  Case [id number]

## 2014-04-06 ENCOUNTER — Telehealth: Payer: Self-pay | Admitting: Internal Medicine

## 2014-04-06 ENCOUNTER — Ambulatory Visit (INDEPENDENT_AMBULATORY_CARE_PROVIDER_SITE_OTHER): Payer: Medicare Other

## 2014-04-06 DIAGNOSIS — J4550 Severe persistent asthma, uncomplicated: Secondary | ICD-10-CM

## 2014-04-06 NOTE — Telephone Encounter (Signed)
Patient with 2 1/2 weeks of nausea.  She has had a workup at her primary care and they can't find a cause.  She will come see Tye Savoy RNP tomorrow at 2:00

## 2014-04-06 NOTE — Telephone Encounter (Signed)
Pt calling about Rx for medication for IBS.  Is miserable.

## 2014-04-06 NOTE — Telephone Encounter (Signed)
Told about Viberzi vs Amitiza/Linzess.  Pt concern was something for nausea.  Asked her about phenergan ordered at office visit.  Stated she never got that.  Pharmacist said it would react with one of her other meds and might cause heart palpitations.  I asked her what med was that.  She seemed to think it was the Mucinex D.  I told her don't take that if need to use nausea med.  Be sure with pharmacist to be sure that was the one and then try the phenergan for nausea.  She also asked dif we thought good idea to see her GI provider.  Yes, by all means call them and schedule a visit.

## 2014-04-06 NOTE — Telephone Encounter (Signed)
Viberzi is for Diarrhea. The other meds treat constipation. She definitely cannot use those!!! There are no other Rx meds available to treat diarrhea. Will have to use otc meds for diarrhea. Can order GI Referral if she wants .

## 2014-04-07 ENCOUNTER — Ambulatory Visit (INDEPENDENT_AMBULATORY_CARE_PROVIDER_SITE_OTHER): Payer: Medicare Other | Admitting: Nurse Practitioner

## 2014-04-07 ENCOUNTER — Encounter: Payer: Self-pay | Admitting: Nurse Practitioner

## 2014-04-07 VITALS — BP 110/58 | HR 69 | Ht 61.0 in | Wt 107.0 lb

## 2014-04-07 DIAGNOSIS — R110 Nausea: Secondary | ICD-10-CM

## 2014-04-07 NOTE — Progress Notes (Signed)
HPI :  Patient is a 64 year old female known remotely to Dr. Carlean Purl. She is on multiple medications / supplements and has multiple drug allergies.  On Jan 30th patient had diarrhea and nausea. No further diarrhea but still has daily nausea resulting in a 5 pound weight loss. Not able to eat or drink much because of the nausea. No recent med changes. Given phenergan prescription but pharmacy advised not to take it because she was on Mucinex. Tried some OTC anti-nausea med but caused too soft of stools. She has taken Naproxen a few times recently but no significant NSAID use.    On Dexilant for two months (Allergist started it) but paitent doesn't know exactly why. She had a modified barium swallowing study recently. Occasionally blows rice from sinus. Occasional solid food dysphagia.  H.pylori breath test negative. Hgb 11.5 (baseline). Celiac panel negative . TSH normal.   Patient had an upper endoscopy for nausea and weight loss in 2008 by [facility]. Exam basically normal. Patient tells me she had been fine between then and now   Past Medical History  Diagnosis Date  . Chronic neck pain   . Allergic rhinitis   . Moderate persistent asthma     -FeV1 72% 2011, -IgE 102 2011, CT sinus Neg 2011  . GERD (gastroesophageal reflux disease)   . Hypothyroidism   . Osteoporosis     on reclast yearly  . Hiatal hernia   . Diverticulosis   . Hemorrhoids   . Asthma   . IBS (irritable bowel syndrome)   . Complication of anesthesia     "had hard time waking up from it several times" (02/20/2012)  . Hypercholesteremia   . Pneumonia 04/2011; ~ 11/2011    "double; single" (02/20/2012)  . Exertional dyspnea   . Graves disease   . Headache(784.0)     "related to allergies; more at different times during the year" (02/20/2012)  . Arthritis     "mostly the hands" (02/20/2012)  . Fibromyalgia 11/2011  . Chronic lower back pain   . Depression     "some; don't take anything for it" (02/20/2012)    . Breast cancer 1998    in remission  . Anxiety   . DVT (deep venous thrombosis)     Family History  Problem Relation Age of Onset  . Allergies Mother   . Heart disease Mother   . Arthritis Mother   . Lung cancer Mother   . Allergies Father   . Heart disease Father   . Arthritis Father   . Colon cancer      Maternal half aunt/Maternal half uncle  . Colitis Daughter    History  Substance Use Topics  . Smoking status: Never Smoker   . Smokeless tobacco: Never Used  . Alcohol Use: 0.0 oz/week     Comment: 02/20/2012 "couple glasses of wine/6 months"   Current Outpatient Prescriptions  Medication Sig Dispense Refill  . acetaminophen (TYLENOL ARTHRITIS PAIN) 650 MG CR tablet Take 650 mg by mouth 2 (two) times daily.     Marland Kitchen albuterol (PROAIR HFA) 108 (90 BASE) MCG/ACT inhaler Inhale 1-2 puffs into the lungs every 6 (six) hours as needed for wheezing or shortness of breath. 1 Inhaler 3  . Alpha-D-Galactosidase (BEANO) TABS Take 1 tablet by mouth daily.     . Alpha-Lipoic Acid 600 MG CAPS Take 600 mg by mouth daily. Once a day    . azelastine (ASTELIN) 0.1 % nasal spray Place  2 sprays into both nostrils daily. Use in each nostril as directed 30 mL 6  . Calcium Citrate-Vitamin D (CITRACAL MAXIMUM) 315-250 MG-UNIT TABS Take 1 tablet by mouth 2 (two) times daily.     . cetirizine (ZYRTEC) 10 MG tablet Take 10 mg by mouth daily.    . Cholecalciferol (VITAMIN D) 2000 UNITS CAPS 1/2 at bedtime    . cyproheptadine (PERIACTIN) 4 MG tablet Take 4 mg by mouth daily.     Marland Kitchen denosumab (PROLIA) 60 MG/ML SOLN injection Inject 60 mg into the skin every 6 (six) months. Administer in upper arm, thigh, or abdomen    . dexlansoprazole (DEXILANT) 60 MG capsule Take 60 mg by mouth daily.    . DULERA 100-5 MCG/ACT AERO INHALE 1 PUFF BY MOUTH TWICE DAILY 13 g 6  . EPINEPHrine (EPIPEN 2-PAK) 0.3 mg/0.3 mL IJ SOAJ injection Inject 0.3 mLs (0.3 mg total) into the muscle once. 1 Device 4  . gabapentin  (NEURONTIN) 600 MG tablet Take 1 tablet by mouth daily.    . hydrocortisone (PROCTOZONE-HC) 2.5 % rectal cream Apply as needed as directed 30 g 11  . lactase (LACTAID) 3000 UNITS tablet Take 3 tablets by mouth as needed. 3-4 tabs with dairy liquids and solids    . lidocaine (LIDODERM) 5 % Place 1 patch onto the skin daily as needed. Remove & Discard patch within 12 hours or as directed by MD 30 patch 11  . Melatonin 3 MG CAPS Take 9 mg by mouth at bedtime. 3 tabs at bedtime    . mometasone (NASONEX) 50 MCG/ACT nasal spray Place 2 sprays into the nose 2 (two) times daily. 17 g 11  . montelukast (SINGULAIR) 10 MG tablet TAKE 1 TABLET BY MOUTH ONCE DAILY 30 tablet 11  . neomycin-bacitracin-polymyxin (NEOSPORIN) ointment Apply 1 application topically every 12 (twelve) hours. Use on/in nose as directed as needed    . nystatin-triamcinolone ointment (MYCOLOG) as needed.    Marland Kitchen omalizumab (XOLAIR) 150 MG injection Inject 150 mg into the skin every 28 (twenty-eight) days. 150 SQ every 4 weeks    . Pancrelipase, Lip-Prot-Amyl, (CREON) 24000 UNITS CPEP TAKE 1 CAPSULE BY MOUTH DAILY 3-4 TIMES DAILY(WITH EACH MEAL) 180 each 11  . Polyethyl Glycol-Propyl Glycol (SYSTANE ULTRA) 0.4-0.3 % SOLN Apply 1 drop to eye 2 (two) times daily as needed. 1-2 drops each eye every morning and at bedtime    . Prenatal Vit-Sel-Fe Fum-FA (VINATE M) 27-1 MG TABS Take 1 tablet by mouth daily. 30 each 11  . Probiotic Product (ALIGN) 4 MG CAPS Take 1 tablet by mouth daily. Once a day    . propylthiouracil (PTU) 50 MG tablet take 1/2 tablet (25 mg) twice daily 30 tablet 5  . pseudoephedrine-guaifenesin (MUCINEX D) 60-600 MG per tablet Take 1 tablet by mouth every 12 (twelve) hours. 60 tablet 3  . rosuvastatin (CRESTOR) 10 MG tablet TAKE 1 TABLET BY MOUTH DAILY 30 tablet 11  . Simethicone (GAS-X EXTRA STRENGTH) 125 MG CAPS Take 2 each by mouth daily as needed. For gas relief    . promethazine (PHENERGAN) 25 MG tablet Take 1 tablet (25  mg total) by mouth every 8 (eight) hours as needed for nausea or vomiting. (Patient not taking: Reported on 04/07/2014) 30 tablet 0  . [DISCONTINUED] methimazole (TAPAZOLE) 5 MG tablet Take 5 mg by mouth 3 (three) times daily.       No current facility-administered medications for this visit.   Allergies  Allergen Reactions  .  Dust Mite Extract Shortness Of Breath and Other (See Comments)    "sneezing" (02/20/2012)  . Molds & Smuts Shortness Of Breath  . Morphine Sulfate Itching  . Other Shortness Of Breath and Other (See Comments)    Grass and weeds "sneezing; filled sinuses" (02/20/2012)  . Penicillins Rash and Other (See Comments)    "welts" (02/20/2012)  . Reclast [Zoledronic Acid]     Put in hospital  . Rofecoxib Swelling    Celebrex REACTION: feet swelling  . Shrimp Flavor Anaphylaxis    ALL SHELLFISH  . Tetracycline Hcl Nausea And Vomiting  . Dilaudid [Hydromorphone Hcl] Itching  . Hydrocodone-Acetaminophen Nausea And Vomiting  . Levofloxacin Other (See Comments)    REACTION: GI upset  . Oxycodone Hcl Nausea And Vomiting  . Paroxetine Nausea And Vomiting    Paxil   . Soy Allergy Other (See Comments)    Limited soy d/t "ESTROGEN POSITIVE" cancer  . Tree Extract Other (See Comments)    "tested and told I was allergic to it; never experienced a reaction to it" (02/20/2012)    Review of Systems: All systems reviewed and negative except where noted in HPI.   Physical Exam: BP 110/58 mmHg  Pulse 69  Ht 5\' 1"  (1.549 m)  Wt 107 lb (48.535 kg)  BMI 20.23 kg/m2  SpO2 99% Constitutional: Pleasant,well-developed, white female in no acute distress. HEENT: Normocephalic and atraumatic. Conjunctivae are normal. No scleral icterus. Neck supple.  Cardiovascular: Normal rate, regular rhythm.  Pulmonary/chest: Effort normal and breath sounds normal. No wheezing, rales or rhonchi. Abdominal: Soft, nondistended, nontender. Bowel sounds active throughout. There are no masses palpable. No  hepatomegaly. Extremities: no edema Lymphadenopathy: No cervical adenopathy noted. Neurological: Alert and oriented to person place and time. Skin: Skin is warm and dry. No rashes noted. Psychiatric: Normal mood and affect. Behavior is normal.   ASSESSMENT AND PLAN:   64 year old female referred for evaluation of nausea. She is on a chronic PPI. Etiology unclear . She is status post remote cholecystectomy . No recent medication changes. Recent labs unrevealing. For further evaluation will schedule her for upper endoscopy.  The benefits, risks, and potential complications of EGD with possible biopsies were discussed with the patient and she agrees to proceed.

## 2014-04-07 NOTE — Patient Instructions (Addendum)
You have been scheduled for an endoscopy. Please follow written instructions given to you at your visit today. If you use inhalers (even only as needed), please bring them with you on the day of your procedure. Your physician has requested that you go to www.[email or web address].com and enter the access code given to you at your visit today. This web site gives a general overview about your procedure. However, you should still follow specific instructions given to you by our office regarding your preparation for the procedure.   PO:EUMPNT [name]

## 2014-04-08 DIAGNOSIS — R110 Nausea: Secondary | ICD-10-CM | POA: Insufficient documentation

## 2014-04-08 MED ORDER — OMALIZUMAB 150 MG ~~LOC~~ SOLR
150.0000 mg | Freq: Once | SUBCUTANEOUS | Status: AC
  Administered 2014-04-06: 150 mg via SUBCUTANEOUS

## 2014-04-08 NOTE — Progress Notes (Signed)
Agree with Ms. Guenther's assessment and plan. Gatha Mayer, MD, [facility]  EGD to look for GI cause of nausea and reassure is ok. Medications, anxiety/depression are other possible causes of the nausea. Fibromyalgia increases chances of functional GI disturbance.

## 2014-04-09 ENCOUNTER — Telehealth: Payer: Self-pay | Admitting: Family Medicine

## 2014-04-09 NOTE — Telephone Encounter (Signed)
No, those are high dose narcotic pain patches.  That would be best handled by a pain clinic.

## 2014-04-09 NOTE — Telephone Encounter (Signed)
Has been calling around to different patient assistance programs about her Lidoderm patches.  Has found out that they just will not be covered by her insurance unless she has post herpatic pain.  Then someone at [facility] program suggested she try Fentanyl patches for her back pain.  Now she wants to try that???  Please advise!!

## 2014-04-12 NOTE — Telephone Encounter (Signed)
Pt understands reason for denial.  Is waiting on appt for pain management now.

## 2014-04-19 ENCOUNTER — Ambulatory Visit: Payer: Medicare Other | Admitting: Critical Care Medicine

## 2014-04-21 ENCOUNTER — Telehealth: Payer: Self-pay | Admitting: Family Medicine

## 2014-04-21 MED ORDER — GABAPENTIN 600 MG PO TABS: 600.0000 mg | ORAL_TABLET | Freq: Every day | ORAL | Status: DC

## 2014-04-21 NOTE — Telephone Encounter (Signed)
Medication refilled per protocol. 

## 2014-04-22 ENCOUNTER — Telehealth: Payer: Self-pay | Admitting: Family Medicine

## 2014-04-22 DIAGNOSIS — R110 Nausea: Secondary | ICD-10-CM

## 2014-04-22 MED ORDER — PROMETHAZINE HCL 25 MG PO TABS: 25.0000 mg | ORAL_TABLET | Freq: Three times a day (TID) | ORAL | Status: DC | PRN

## 2014-04-22 NOTE — Telephone Encounter (Signed)
Medication refilled per protocol. 

## 2014-04-26 ENCOUNTER — Telehealth: Payer: Self-pay | Admitting: Internal Medicine

## 2014-04-26 ENCOUNTER — Ambulatory Visit: Payer: Medicare Other | Admitting: Critical Care Medicine

## 2014-04-26 NOTE — Telephone Encounter (Signed)
Patient advised that she should move up her appt.  She wants to discuss with her ride prior to rescheduling.  She is advised to continue the phenergan and a bland diet.  She will check with her ride and call back to reschedule.

## 2014-04-30 ENCOUNTER — Ambulatory Visit (AMBULATORY_SURGERY_CENTER): Payer: Medicare Other | Admitting: Internal Medicine

## 2014-04-30 ENCOUNTER — Encounter: Payer: Self-pay | Admitting: Internal Medicine

## 2014-04-30 VITALS — BP 141/68 | HR 63 | Temp 97.2°F | Resp 20 | Ht 61.0 in | Wt 107.0 lb

## 2014-04-30 DIAGNOSIS — R110 Nausea: Secondary | ICD-10-CM

## 2014-04-30 MED ORDER — SODIUM CHLORIDE 0.9 % IV SOLN: 500.0000 mL | INTRAVENOUS | Status: DC

## 2014-04-30 MED ORDER — MIRTAZAPINE 15 MG PO TBDP: 15.0000 mg | ORAL_TABLET | Freq: Every day | ORAL | Status: DC

## 2014-04-30 NOTE — Progress Notes (Signed)
Report to PACU, RN, vss, BBS= Clear.  

## 2014-04-30 NOTE — Op Note (Signed)
[place]  [address] [place], [id number]   ENDOSCOPY PROCEDURE REPORT  PATIENT: Ebony Scott, Ebony Scott  MR#: [id number] BIRTHDATE: 1950/09/03 , 52  yrs. old GENDER: female ENDOSCOPIST: Gatha Mayer, MD, [facility] PROCEDURE DATE:  04/30/2014 PROCEDURE:  EGD, diagnostic ASA CLASS:     Class III INDICATIONS:  nausea. MEDICATIONS: Propofol 100 mg IV, Monitored anesthesia care, and Lidocaine 100 mg IV TOPICAL ANESTHETIC: none  DESCRIPTION OF PROCEDURE: After the risks benefits and alternatives of the procedure were thoroughly explained, informed consent was obtained.  The LB [id number] endoscope was introduced through the mouth and advanced to the second portion of the duodenum , Without limitations.  The instrument was slowly withdrawn as the mucosa was fully examined.    EXAM: The esophagus and gastroesophageal junction were completely normal in appearance.  The stomach was entered and closely examined.The antrum, angularis, and lesser curvature were well visualized, including a retroflexed view of the cardia and fundus. The stomach wall was normally distensable.  The scope passed easily through the pylorus into the duodenum.  Retroflexed views revealed no abnormalities.     The scope was then withdrawn from the patient and the procedure completed.  COMPLICATIONS: There were no immediate complications.  ENDOSCOPIC IMPRESSION: Normal appearing esophagus and GE junction, the stomach was well visualized and normal in appearance, normal appearing duodenum  RECOMMENDATIONS: Mirtazipine 15 mg qhs to treat insomnia, nausea and anorexia see me in 6- 8 weeks stop melatonin   eSigned:  Gatha Mayer, MD, [facility] 04/30/2014 9:52 AM    CC: Karis Juba, PA, Allena Katz, MD and The Patient

## 2014-04-30 NOTE — Patient Instructions (Addendum)
The endoscopy was normal. I suspect the nausea is not originating from the gastrointestinal tract. Could be medication side effects, or possibly stress reaction causing physical symptoms.  I want you to try a medication called mirtazipine (Remeron). It will help you sleep, stop nausea and improve appetite. I am sending prescription to the pharmacy. STOP MELATONIN (I took it off your list) You could add it back but I bet you will sleep well on the new medication. Please call next week and get an appointment to see me in about 6-8 weeks. Call back sooner as needed.  I appreciate the opportunity to care for you. Gatha Mayer, MD, FACG   YOU HAD AN ENDOSCOPIC PROCEDURE TODAY AT [facility]:   Refer to the procedure report that was given to you for any specific questions about what was found during the examination.  If the procedure report does not answer your questions, please call your gastroenterologist to clarify.  If you requested that your care partner not be given the details of your procedure findings, then the procedure report has been included in a sealed envelope for you to review at your convenience later.  YOU SHOULD EXPECT: Some feelings of bloating in the abdomen. Passage of more gas than usual.  Walking can help get rid of the air that was put into your GI tract during the procedure and reduce the bloating. If you had a lower endoscopy (such as a colonoscopy or flexible sigmoidoscopy) you may notice spotting of blood in your stool or on the toilet paper. If you underwent a bowel prep for your procedure, you may not have a normal bowel movement for a few days.  Please Note:  You might notice some irritation and congestion in your nose or some drainage.  This is from the oxygen used during your procedure.  There is no need for concern and it should clear up in a day or so.  SYMPTOMS TO REPORT IMMEDIATELY:     Following upper endoscopy (EGD)  Vomiting  of blood or coffee ground material  New chest pain or pain under the shoulder blades  Painful or persistently difficult swallowing  New shortness of breath  Fever of 100F or higher  Black, tarry-looking stools  For urgent or emergent issues, a gastroenterologist can be reached at any hour by calling [phone].   DIET: Your first meal following the procedure should be a small meal and then it is ok to progress to your normal diet. Heavy or fried foods are harder to digest and may make you feel nauseous or bloated.  Likewise, meals heavy in dairy and vegetables can increase bloating.  Drink plenty of fluids but you should avoid alcoholic beverages for 24 hours.  ACTIVITY:  You should plan to take it easy for the rest of today and you should NOT DRIVE or use heavy machinery until tomorrow (because of the sedation medicines used during the test).    FOLLOW UP: Our staff will call the number listed on your records the next business day following your procedure to check on you and address any questions or concerns that you may have regarding the information given to you following your procedure. If we do not reach you, we will leave a message.  However, if you are feeling well and you are not experiencing any problems, there is no need to return our call.  We will assume that you have returned to your regular daily activities without incident.  If any biopsies were taken you will be contacted by phone or by letter within the next 1-3 weeks.  Please call us at [phone] if you have not heard about the biopsies in 3 weeks.    SIGNATURES/CONFIDENTIALITY: You and/or your care partner have signed paperwork which will be entered into your electronic medical record.  These signatures attest to the fact that that the information above on your After Visit Summary has been reviewed and is understood.  Full responsibility of the confidentiality of this discharge information lies with you and/or your  care-partner.

## 2014-05-03 ENCOUNTER — Telehealth: Payer: Self-pay | Admitting: *Deleted

## 2014-05-03 ENCOUNTER — Ambulatory Visit (INDEPENDENT_AMBULATORY_CARE_PROVIDER_SITE_OTHER): Payer: Medicare Other | Admitting: Critical Care Medicine

## 2014-05-03 ENCOUNTER — Encounter: Payer: Self-pay | Admitting: Critical Care Medicine

## 2014-05-03 VITALS — BP 142/78 | HR 67 | Temp 97.1°F | Ht 61.75 in | Wt 106.8 lb

## 2014-05-03 DIAGNOSIS — R112 Nausea with vomiting, unspecified: Secondary | ICD-10-CM

## 2014-05-03 DIAGNOSIS — J4550 Severe persistent asthma, uncomplicated: Secondary | ICD-10-CM

## 2014-05-03 NOTE — Telephone Encounter (Signed)
Would not expect the mirtazipine to relieve sxs over a weekend Needs to build up in system

## 2014-05-03 NOTE — Patient Instructions (Signed)
Stop Creon and periactin to see if nausea is any better No other changes Return 3 months Ask Dr Neldon Mc for any swallow ?s since he ordered the swallow study

## 2014-05-03 NOTE — Telephone Encounter (Signed)
Called pt. Back and informed her that Dr. Carlean Purl stated that the medication he gave her had to build up in her system before she would see results., pt. Verbalized understanding.

## 2014-05-03 NOTE — Progress Notes (Signed)
Subjective:    Patient ID: Ebony Scott, female    DOB: 09/26/1950, 64 y.o.   MRN: [id number]  HPI 64 y.o.. WF atopic moderate persistent asthma, elevated IgE. Started Xolair 05/03/10     PUL ASTHMA HISTORY 05/03/2014 01/05/2014 11/07/2013 07/17/2013 08/22/2011 01/31/2011 09/20/2010  Symptoms >2 days/week Daily Throughout the day 0-2 days/week Daily 0-2 days/week Daily  Nighttime awakenings 0-2/month 3-4/month 3-4/month 0-2/month 0-2/month 0-2/month -  Interference with activity Minor limitations Some limitations Some limitations Minor limitations Some limitations No limitations -  SABA use - > 2 days/wk--not > 1 x/day Daily 0-2 days/wk 0-2 days/wk 0-2 days/wk -  Exacerbations requiring oral steroids - 0-1 / year 2 or more / year 0-1 / year 0-1 / year 0-1 / year -   05/03/2014 Chief Complaint  Patient presents with  . Follow-up    SOB, had endoscopic done 04/30/2014, still feeling nauseated.  Some tightness in chest.  some cough, especially in the mornings.  A lot of yellow mucus comes out in the morning  Not seen since 12/2013.  Has been followed by [facility] and Rx several ABX.  All labs redone for allergy testing: pos mold only.  Rx additional allergy med.   Hx of GI issues 03/20/14; Nausea, GI symptoms.  Recent EGD normal.  Mirtazipine rx for insomnia and nausea.  No GI pathology    Review of SystemsConstitutional:   No  weight loss, night sweats,  Fevers, chills,  +fatigue, or  lassitude.  HEENT:   No headaches,  Difficulty swallowing,  Tooth/dental problems, or  Sore throat,                +sneezing, itching, ear ache,  +nasal congestion, +post nasal drip,   CV:  No chest pain,  Orthopnea, PND, swelling in lower extremities, anasarca, dizziness, palpitations, syncope.   GI  No heartburn, indigestion, abdominal pain.    ++++nausea,    NO vomiting, diarrhea, change in bowel habits, ++++loss of appetite,   Resp:  ,  No coughing up of blood.   No chest wall deformity  Skin: no rash or  lesions.  GU: no dysuria, change in color of urine, no urgency or frequency.  No flank pain, no hematuria   MS:  No joint pain or swelling.  No decreased range of motion.  No back pain.  Psych:  No change in mood or affect. No depression or anxiety.  No memory loss.      Objective:   Physical Exam  BP 142/78 mmHg  Pulse 67  Temp(Src) 97.1 F (36.2 C) (Oral)  Ht 5' 1.75" (1.568 m)  Wt 106 lb 12.8 oz (48.444 kg)  BMI 19.70 kg/m2  SpO2 99%  GEN: A/Ox3; pleasant , NAD, well nourished   HEENT:  [place]/AT,  EACs-clear, TMs-wnl, NOSE-clear drainage , THROAT, nasal purulence   NECK:  Supple w/ fair ROM; no JVD; normal carotid impulses w/o bruits; no thyromegaly or nodules palpated; no lymphadenopathy.  RESP  Clear  P & A; no accessory muscle use, no dullness to percussion  CARD:  RRR, no m/r/g  , no peripheral edema, pulses intact, no cyanosis or clubbing.  GI:   Soft & nt; nml bowel sounds; no organomegaly or masses detected.  Musco: Warm bil, no deformities or joint swelling noted.   Neuro: alert, no focal deficits noted.    Skin: Warm, no lesions or rashes   No results found.     Assessment & Plan:   Severe persistent asthma  with significant atopic features Severe persistent asthma with multiple atopic features but primarily mold-induced allergies with moderate airway obstruction and elevated IgE levels on Xolair therapy Patient on multiple antihistamines at this time and now has developed increased nausea and I'm concerned that the Periactin and the Creon is contributing to this nausea There is no overt history of pancreatitis or pancreatic insufficiency that I can discern Patient's airway function is relatively stable at this time Plan Stop Creon and periactin to see if nausea is any better No other changes Return 3 months      Updated Medication List Outpatient Encounter Prescriptions as of 05/03/2014  Medication Sig  . acetaminophen (TYLENOL ARTHRITIS PAIN)  650 MG CR tablet Take 650 mg by mouth 2 (two) times daily.   Marland Kitchen albuterol (PROAIR HFA) 108 (90 BASE) MCG/ACT inhaler Inhale 1-2 puffs into the lungs every 6 (six) hours as needed for wheezing or shortness of breath.  . Alpha-D-Galactosidase (BEANO) TABS Take 1 tablet by mouth daily.   . Alpha-Lipoic Acid 600 MG CAPS Take 600 mg by mouth daily. Once a day  . azelastine (ASTELIN) 0.1 % nasal spray Place 2 sprays into both nostrils daily. Use in each nostril as directed  . Calcium Citrate-Vitamin D (CITRACAL MAXIMUM) 315-250 MG-UNIT TABS Take 1 tablet by mouth 2 (two) times daily.   . cetirizine (ZYRTEC) 10 MG tablet Take 10 mg by mouth daily.  . Cholecalciferol (VITAMIN D) 2000 UNITS CAPS 1/2 at bedtime  . colchicine 0.6 MG tablet   . denosumab (PROLIA) 60 MG/ML SOLN injection Inject 60 mg into the skin every 6 (six) months. Administer in upper arm, thigh, or abdomen  . dexlansoprazole (DEXILANT) 60 MG capsule Take 60 mg by mouth daily.  . DULERA 100-5 MCG/ACT AERO INHALE 1 PUFF BY MOUTH TWICE DAILY  . EPINEPHrine (EPIPEN 2-PAK) 0.3 mg/0.3 mL IJ SOAJ injection Inject 0.3 mLs (0.3 mg total) into the muscle once.  . gabapentin (NEURONTIN) 600 MG tablet Take 1 tablet (600 mg total) by mouth daily.  . hydrocortisone (PROCTOZONE-HC) 2.5 % rectal cream Apply as needed as directed  . lactase (LACTAID) 3000 UNITS tablet Take 3 tablets by mouth as needed. 3-4 tabs with dairy liquids and solids  . lidocaine (LIDODERM) 5 % Place 1 patch onto the skin daily as needed. Remove & Discard patch within 12 hours or as directed by MD  . mirtazapine (REMERON SOL-TAB) 15 MG disintegrating tablet Take 1 tablet (15 mg total) by mouth at bedtime.  . mometasone (NASONEX) 50 MCG/ACT nasal spray Place 2 sprays into the nose 2 (two) times daily.  . montelukast (SINGULAIR) 10 MG tablet TAKE 1 TABLET BY MOUTH ONCE DAILY  . neomycin-bacitracin-polymyxin (NEOSPORIN) ointment Apply 1 application topically every 12 (twelve) hours.  Use on/in nose as directed as needed  . nystatin (MYCOSTATIN) 100000 UNIT/ML suspension Use as directed 5 mLs in the mouth or throat. Swish and spit out twice daily  . nystatin-triamcinolone ointment (MYCOLOG) as needed.  Marland Kitchen omalizumab (XOLAIR) 150 MG injection Inject 150 mg into the skin every 28 (twenty-eight) days. 150 SQ every 4 weeks  . Polyethyl Glycol-Propyl Glycol (SYSTANE ULTRA) 0.4-0.3 % SOLN Apply 1 drop to eye 2 (two) times daily as needed. 1-2 drops each eye every morning and at bedtime  . Prenatal Vit-Sel-Fe Fum-FA (VINATE M) 27-1 MG TABS Take 1 tablet by mouth daily.  . Probiotic Product (ALIGN) 4 MG CAPS Take 1 tablet by mouth daily. Once a day  . promethazine (PHENERGAN)  25 MG tablet Take 1 tablet (25 mg total) by mouth every 8 (eight) hours as needed for nausea or vomiting.  . propylthiouracil (PTU) 50 MG tablet take 1/2 tablet (25 mg) twice daily  . pseudoephedrine-guaifenesin (MUCINEX D) 60-600 MG per tablet Take 1 tablet by mouth every 12 (twelve) hours.  . rosuvastatin (CRESTOR) 10 MG tablet TAKE 1 TABLET BY MOUTH DAILY  . Simethicone (GAS-X EXTRA STRENGTH) 125 MG CAPS Take 2 each by mouth daily as needed. For gas relief  . [DISCONTINUED] cyproheptadine (PERIACTIN) 4 MG tablet Take 4 mg by mouth daily.   . [DISCONTINUED] Pancrelipase, Lip-Prot-Amyl, (CREON) 24000 UNITS CPEP TAKE 1 CAPSULE BY MOUTH DAILY 3-4 TIMES DAILY(WITH EACH MEAL)

## 2014-05-03 NOTE — Telephone Encounter (Signed)
  Follow up Call-  Call back number 04/30/2014  Post procedure Call Back phone  # [phone]  Permission to leave phone message Yes     Patient questions:  Do you have a fever, pain , or abdominal swelling? No. Pain Score  0 *  Have you tolerated food without any problems? No.  Have you been able to return to your normal activities? Yes.    Do you have any questions about your discharge instructions: Diet   No. Medications  No. Follow up visit  No.  Do you have questions or concerns about your Care? No.  Actions: * If pain score is 4 or above: No action needed, pain <4.  Pt. Stated that she is still having nausea with regular food,but denies abdominal pain and swelling.

## 2014-05-04 NOTE — Assessment & Plan Note (Signed)
Severe persistent asthma with multiple atopic features but primarily mold-induced allergies with moderate airway obstruction and elevated IgE levels on Xolair therapy Patient on multiple antihistamines at this time and now has developed increased nausea and I'm concerned that the Periactin and the Creon is contributing to this nausea There is no overt history of pancreatitis or pancreatic insufficiency that I can discern Patient's airway function is relatively stable at this time Plan Stop Creon and periactin to see if nausea is any better No other changes Return 3 months

## 2014-05-07 ENCOUNTER — Other Ambulatory Visit: Payer: Medicare Other

## 2014-05-07 DIAGNOSIS — Z0000 Encounter for general adult medical examination without abnormal findings: Secondary | ICD-10-CM

## 2014-05-07 DIAGNOSIS — E785 Hyperlipidemia, unspecified: Secondary | ICD-10-CM

## 2014-05-07 DIAGNOSIS — E559 Vitamin D deficiency, unspecified: Secondary | ICD-10-CM

## 2014-05-07 LAB — LIPID PANEL
Cholesterol: 154 mg/dL (ref 0–200)
HDL: 68 mg/dL (ref >=46)
LDL Cholesterol: 70 mg/dL (ref 0–99)
Total CHOL/HDL Ratio: 2.3 Ratio
Triglycerides: 80 mg/dL (ref <150)
VLDL: 16 mg/dL (ref 0–40)

## 2014-05-07 LAB — COMPREHENSIVE METABOLIC PANEL
ALT: 22 U/L (ref 0–35)
AST: 25 U/L (ref 0–37)
Albumin: 4 g/dL (ref 3.5–5.2)
Alkaline Phosphatase: 31 U/L — ABNORMAL LOW (ref 39–117)
BUN: 14 mg/dL (ref 6–23)
CO2: 25 mEq/L (ref 19–32)
Calcium: 9 mg/dL (ref 8.4–10.5)
Chloride: 105 mEq/L (ref 96–112)
Creat: 0.72 mg/dL (ref 0.50–1.10)
Glucose, Bld: 82 mg/dL (ref 70–99)
Potassium: 4.1 mEq/L (ref 3.5–5.3)
Sodium: 141 mEq/L (ref 135–145)
Total Bilirubin: 0.4 mg/dL (ref 0.2–1.2)
Total Protein: 6.5 g/dL (ref 6.0–8.3)

## 2014-05-07 LAB — CBC WITH DIFFERENTIAL/PLATELET
Basophils Absolute: 0.1 10*3/uL (ref 0.0–0.1)
Basophils Relative: 1 % (ref 0–1)
Eosinophils Absolute: 0.5 10*3/uL (ref 0.0–0.7)
Eosinophils Relative: 9 % — ABNORMAL HIGH (ref 0–5)
HCT: 36.1 % (ref 36.0–46.0)
Hemoglobin: 11.7 g/dL — ABNORMAL LOW (ref 12.0–15.0)
Lymphocytes Relative: 39 % (ref 12–46)
Lymphs Abs: 2.3 10*3/uL (ref 0.7–4.0)
MCH: 32.3 pg (ref 26.0–34.0)
MCHC: 32.4 g/dL (ref 30.0–36.0)
MCV: 99.7 fL (ref 78.0–100.0)
MPV: 10.7 fL (ref 8.6–12.4)
Monocytes Absolute: 0.9 10*3/uL (ref 0.1–1.0)
Monocytes Relative: 15 % — ABNORMAL HIGH (ref 3–12)
Neutro Abs: 2.2 10*3/uL (ref 1.7–7.7)
Neutrophils Relative %: 36 % — ABNORMAL LOW (ref 43–77)
Platelets: 206 10*3/uL (ref 150–400)
RBC: 3.62 MIL/uL — ABNORMAL LOW (ref 3.87–5.11)
RDW: 13.7 % (ref 11.5–15.5)
WBC: 6 10*3/uL (ref 4.0–10.5)

## 2014-05-07 LAB — TSH: TSH: 3.264 u[IU]/mL (ref 0.350–4.500)

## 2014-05-08 LAB — VITAMIN D 25 HYDROXY (VIT D DEFICIENCY, FRACTURES): Vit D, 25-Hydroxy: 53 ng/mL (ref 30–100)

## 2014-05-11 ENCOUNTER — Encounter: Payer: Self-pay | Admitting: Family Medicine

## 2014-05-11 ENCOUNTER — Ambulatory Visit: Payer: Medicare Other | Admitting: Family Medicine

## 2014-05-11 ENCOUNTER — Ambulatory Visit (INDEPENDENT_AMBULATORY_CARE_PROVIDER_SITE_OTHER): Payer: Medicare Other | Admitting: Family Medicine

## 2014-05-11 ENCOUNTER — Telehealth: Payer: Self-pay | Admitting: Critical Care Medicine

## 2014-05-11 ENCOUNTER — Ambulatory Visit (INDEPENDENT_AMBULATORY_CARE_PROVIDER_SITE_OTHER): Payer: Medicare Other

## 2014-05-11 VITALS — BP 128/60 | HR 82 | Temp 98.0°F | Resp 18 | Ht 61.7 in | Wt 108.0 lb

## 2014-05-11 DIAGNOSIS — Z0000 Encounter for general adult medical examination without abnormal findings: Secondary | ICD-10-CM

## 2014-05-11 DIAGNOSIS — J4550 Severe persistent asthma, uncomplicated: Secondary | ICD-10-CM | POA: Diagnosis not present

## 2014-05-11 NOTE — Progress Notes (Signed)
Subjective:    Patient ID: Ebony Scott, female    DOB: Nov 13, 1950, 64 y.o.   MRN: [id number]  HPI Has recently been seen by her gastroenterologist and has undergone an endoscopy due for early satiety and nausea. The endoscopy was relatively normal. Her gastroenterologist started her on Remeron at night to try to stimulate her appetite. She is only been on the medication for 11 days. Her weight is relatively stable at the present time. She denies any fevers or chills weight loss melena or hematochezia. Her mammogram and bone density tests are scheduled for later in August. She is also scheduled for a colonoscopy later this year. Her Pap smear is up-to-date. Wt Readings from Last 3 Encounters:  05/11/14 108 lb (48.988 kg)  05/03/14 106 lb 12.8 oz (48.444 kg)  04/30/14 107 lb (48.535 kg)  Her most recent lab work as listed below: Lab on 05/07/2014  Component Date Value Ref Range Status  . WBC 05/07/2014 6.0  4.0 - 10.5 K/uL Final  . RBC 05/07/2014 3.62* 3.87 - 5.11 MIL/uL Final  . Hemoglobin 05/07/2014 11.7* 12.0 - 15.0 g/dL Final  . HCT 05/07/2014 36.1  36.0 - 46.0 % Final  . MCV 05/07/2014 99.7  78.0 - 100.0 fL Final  . MCH 05/07/2014 32.3  26.0 - 34.0 pg Final  . MCHC 05/07/2014 32.4  30.0 - 36.0 g/dL Final  . RDW 05/07/2014 13.7  11.5 - 15.5 % Final  . Platelets 05/07/2014 206  150 - 400 K/uL Final  . MPV 05/07/2014 10.7  8.6 - 12.4 fL Final  . Neutrophils Relative % 05/07/2014 36* 43 - 77 % Final  . Neutro Abs 05/07/2014 2.2  1.7 - 7.7 K/uL Final  . Lymphocytes Relative 05/07/2014 39  12 - 46 % Final  . Lymphs Abs 05/07/2014 2.3  0.7 - 4.0 K/uL Final  . Monocytes Relative 05/07/2014 15* 3 - 12 % Final  . Monocytes Absolute 05/07/2014 0.9  0.1 - 1.0 K/uL Final  . Eosinophils Relative 05/07/2014 9* 0 - 5 % Final  . Eosinophils Absolute 05/07/2014 0.5  0.0 - 0.7 K/uL Final  . Basophils Relative 05/07/2014 1  0 - 1 % Final  . Basophils Absolute 05/07/2014 0.1  0.0 - 0.1 K/uL Final  .  Smear Review 05/07/2014 Criteria for review not met   Final  . Sodium 05/07/2014 141  135 - 145 mEq/L Final  . Potassium 05/07/2014 4.1  3.5 - 5.3 mEq/L Final  . Chloride 05/07/2014 105  96 - 112 mEq/L Final  . CO2 05/07/2014 25  19 - 32 mEq/L Final  . Glucose, Bld 05/07/2014 82  70 - 99 mg/dL Final  . BUN 05/07/2014 14  6 - 23 mg/dL Final  . Creat 05/07/2014 0.72  0.50 - 1.10 mg/dL Final  . Total Bilirubin 05/07/2014 0.4  0.2 - 1.2 mg/dL Final  . Alkaline Phosphatase 05/07/2014 31* 39 - 117 U/L Final  . AST 05/07/2014 25  0 - 37 U/L Final  . ALT 05/07/2014 22  0 - 35 U/L Final  . Total Protein 05/07/2014 6.5  6.0 - 8.3 g/dL Final  . Albumin 05/07/2014 4.0  3.5 - 5.2 g/dL Final  . Calcium 05/07/2014 9.0  8.4 - 10.5 mg/dL Final  . Cholesterol 05/07/2014 154  0 - 200 mg/dL Final   Comment: ATP III Classification:       < 200        mg/dL  Desirable      200 - 239     mg/dL        Borderline High      >= 240        mg/dL        High     . Triglycerides 05/07/2014 80  <150 mg/dL Final  . HDL 05/07/2014 68  >=46 mg/dL Final   ** Please note change in reference range(s). **  . Total CHOL/HDL Ratio 05/07/2014 2.3   Final  . VLDL 05/07/2014 16  0 - 40 mg/dL Final  . LDL Cholesterol 05/07/2014 70  0 - 99 mg/dL Final   Comment:   Total Cholesterol/HDL Ratio:CHD Risk                        Coronary Heart Disease Risk Table                                        Men       Women          1/2 Average Risk              3.4        3.3              Average Risk              5.0        4.4           2X Average Risk              9.6        7.1           3X Average Risk             23.4       11.0 Use the calculated Patient Ratio above and the CHD Risk table  to determine the patient's CHD Risk. ATP III Classification (LDL):       < 100        mg/dL         Optimal      100 - 129     mg/dL         Near or Above Optimal      130 - 159     mg/dL         Borderline High      160 - 189      mg/dL         High       > 190        mg/dL         Very High     . TSH 05/07/2014 3.264  0.350 - 4.500 uIU/mL Final  . Vit D, 25-Hydroxy 05/07/2014 53  30 - 100 ng/mL Final   Comment: Vitamin D Status           25-OH Vitamin D        Deficiency                <20 ng/mL        Insufficiency         20 - 29 ng/mL        Optimal             > or = 30 ng/mL   For 25-OH Vitamin D testing  on patients on D2-supplementation and patients for whom quantitation of D2 and D3 fractions is required, the [facility] 25-OH VIT D, (D2,D3), LC/MS/MS is recommended: order code [phone] (patients > 2 yrs).    Past Medical History  Diagnosis Date  . Chronic neck pain   . Allergic rhinitis   . Moderate persistent asthma     -FeV1 72% 2011, -IgE 102 2011, CT sinus Neg 2011  . GERD (gastroesophageal reflux disease)   . Hypothyroidism   . Osteoporosis     on reclast yearly  . Hiatal hernia   . Diverticulosis   . Hemorrhoids   . Asthma   . IBS (irritable bowel syndrome)   . Complication of anesthesia     "had hard time waking up from it several times" (02/20/2012)  . Hypercholesteremia   . Pneumonia 04/2011; ~ 11/2011    "double; single" (02/20/2012)  . Exertional dyspnea   . Graves disease   . Headache(784.0)     "related to allergies; more at different times during the year" (02/20/2012)  . Arthritis     "mostly the hands" (02/20/2012)  . Fibromyalgia 11/2011  . Chronic lower back pain   . Depression     "some; don't take anything for it" (02/20/2012)  . Breast cancer 1998    in remission  . Anxiety   . DVT (deep venous thrombosis)   . Allergy     SEASONAL  . Anemia   . Cataract     REMOVED   Past Surgical History  Procedure Laterality Date  . Cervical fusion  2003    C3-C4  . Cervical discectomy  10/2001    C5-C6  . Vesicovaginal fistula closure w/ tah  1988  . Nasal septum surgery  1980's  . Anterior and posterior repair  1990's  . Breast lumpectomy  1998    left  . Debridement  tennis elbow  ?1970's    right  . Knee arthroplasty  ?1990's    "?right; w/cartilage repair" (02/20/2012)  . Carpometacarpel ([facility]) fusion of thumb with autograft from radius  ~ 2009    "both thumbs" (02/20/2012)  . Cataract extraction w/ intraocular lens  implant, bilateral  2012  . Tonsillectomy  ~ 1953  . Posterior cervical fusion/foraminotomy  2004    "failed initial fusion; rewired  anterior neck" (02/20/2012)  . Colonoscopy    . Appendectomy    . Cholecystectomy    . Hysterectomy     Current Outpatient Prescriptions on File Prior to Visit  Medication Sig Dispense Refill  . acetaminophen (TYLENOL ARTHRITIS PAIN) 650 MG CR tablet Take 650 mg by mouth 2 (two) times daily.     Marland Kitchen albuterol (PROAIR HFA) 108 (90 BASE) MCG/ACT inhaler Inhale 1-2 puffs into the lungs every 6 (six) hours as needed for wheezing or shortness of breath. 1 Inhaler 3  . Alpha-D-Galactosidase (BEANO) TABS Take 1 tablet by mouth daily.     . Alpha-Lipoic Acid 600 MG CAPS Take 600 mg by mouth daily. Once a day    . azelastine (ASTELIN) 0.1 % nasal spray Place 2 sprays into both nostrils daily. Use in each nostril as directed 30 mL 6  . Calcium Citrate-Vitamin D (CITRACAL MAXIMUM) 315-250 MG-UNIT TABS Take 1 tablet by mouth 2 (two) times daily.     . cetirizine (ZYRTEC) 10 MG tablet Take 10 mg by mouth daily.    . Cholecalciferol (VITAMIN D) 2000 UNITS CAPS 1/2 at bedtime    . colchicine 0.6 MG tablet     .  denosumab (PROLIA) 60 MG/ML SOLN injection Inject 60 mg into the skin every 6 (six) months. Administer in upper arm, thigh, or abdomen    . dexlansoprazole (DEXILANT) 60 MG capsule Take 60 mg by mouth daily.    . DULERA 100-5 MCG/ACT AERO INHALE 1 PUFF BY MOUTH TWICE DAILY 13 g 6  . EPINEPHrine (EPIPEN 2-PAK) 0.3 mg/0.3 mL IJ SOAJ injection Inject 0.3 mLs (0.3 mg total) into the muscle once. 1 Device 4  . gabapentin (NEURONTIN) 600 MG tablet Take 1 tablet (600 mg total) by mouth daily. 90 tablet 3  . hydrocortisone  (PROCTOZONE-HC) 2.5 % rectal cream Apply as needed as directed 30 g 11  . lactase (LACTAID) 3000 UNITS tablet Take 3 tablets by mouth as needed. 3-4 tabs with dairy liquids and solids    . lidocaine (LIDODERM) 5 % Place 1 patch onto the skin daily as needed. Remove & Discard patch within 12 hours or as directed by MD 30 patch 11  . mirtazapine (REMERON SOL-TAB) 15 MG disintegrating tablet Take 1 tablet (15 mg total) by mouth at bedtime. 30 tablet 2  . mometasone (NASONEX) 50 MCG/ACT nasal spray Place 2 sprays into the nose 2 (two) times daily. 17 g 11  . montelukast (SINGULAIR) 10 MG tablet TAKE 1 TABLET BY MOUTH ONCE DAILY 30 tablet 11  . neomycin-bacitracin-polymyxin (NEOSPORIN) ointment Apply 1 application topically every 12 (twelve) hours. Use on/in nose as directed as needed    . nystatin (MYCOSTATIN) 100000 UNIT/ML suspension Use as directed 5 mLs in the mouth or throat. Swish and spit out twice daily    . nystatin-triamcinolone ointment (MYCOLOG) as needed.    Marland Kitchen omalizumab (XOLAIR) 150 MG injection Inject 150 mg into the skin every 28 (twenty-eight) days. 150 SQ every 4 weeks    . Polyethyl Glycol-Propyl Glycol (SYSTANE ULTRA) 0.4-0.3 % SOLN Apply 1 drop to eye 2 (two) times daily as needed. 1-2 drops each eye every morning and at bedtime    . Prenatal Vit-Sel-Fe Fum-FA (VINATE M) 27-1 MG TABS Take 1 tablet by mouth daily. 30 each 11  . Probiotic Product (ALIGN) 4 MG CAPS Take 1 tablet by mouth daily. Once a day    . promethazine (PHENERGAN) 25 MG tablet Take 1 tablet (25 mg total) by mouth every 8 (eight) hours as needed for nausea or vomiting. 30 tablet 0  . propylthiouracil (PTU) 50 MG tablet take 1/2 tablet (25 mg) twice daily 30 tablet 5  . pseudoephedrine-guaifenesin (MUCINEX D) 60-600 MG per tablet Take 1 tablet by mouth every 12 (twelve) hours. 60 tablet 3  . rosuvastatin (CRESTOR) 10 MG tablet TAKE 1 TABLET BY MOUTH DAILY 30 tablet 11  . Simethicone (GAS-X EXTRA STRENGTH) 125 MG CAPS  Take 2 each by mouth daily as needed. For gas relief    . [DISCONTINUED] methimazole (TAPAZOLE) 5 MG tablet Take 5 mg by mouth 3 (three) times daily.       No current facility-administered medications on file prior to visit.   Allergies  Allergen Reactions  . Dust Mite Extract Shortness Of Breath and Other (See Comments)    "sneezing" (02/20/2012)  . Molds & Smuts Shortness Of Breath  . Morphine Sulfate Itching  . Other Shortness Of Breath and Other (See Comments)    Grass and weeds "sneezing; filled sinuses" (02/20/2012)  . Penicillins Rash and Other (See Comments)    "welts" (02/20/2012)  . Reclast [Zoledronic Acid]     Put in hospital  . Rofecoxib  Swelling    Celebrex REACTION: feet swelling  . Shrimp Flavor Anaphylaxis    ALL SHELLFISH  . Tetracycline Hcl Nausea And Vomiting  . Dilaudid [Hydromorphone Hcl] Itching  . Hydrocodone-Acetaminophen Nausea And Vomiting  . Levofloxacin Other (See Comments)    REACTION: GI upset  . Oxycodone Hcl Nausea And Vomiting  . Paroxetine Nausea And Vomiting    Paxil   . Tree Extract Other (See Comments)    "tested and told I was allergic to it; never experienced a reaction to it" (02/20/2012)   History   Social History  . Marital Status: Divorced    Spouse Name: N/A  . Number of Children: 2  . Years of Education: college   Occupational History  . Disabled     Retired Print production planner  .     Social History Main Topics  . Smoking status: Never Smoker   . Smokeless tobacco: Never Used  . Alcohol Use: 0.0 oz/week    0 Standard drinks or equivalent per week     Comment: 02/20/2012 "couple glasses of wine/6 months"  . Drug Use: No  . Sexual Activity: No   Other Topics Concern  . Not on file   Social History Narrative   Patient lives at home alone. Patient  divorced.    Patient has her BS degree.   Right handed.   Caffeine- sometimes coffee.            Family History  Problem Relation Age of Onset  . Allergies  Mother   . Heart disease Mother   . Arthritis Mother   . Lung cancer Mother   . Diabetes Mother   . Allergies Father   . Heart disease Father   . Arthritis Father   . Colon cancer      Maternal half aunt/Maternal half uncle  . Colitis Daughter   . Diabetes Maternal Grandfather      Review of Systems  All other systems reviewed and are negative.      Objective:   Physical Exam  Constitutional: She is oriented to person, place, and time. She appears well-developed and well-nourished. No distress.  HENT:  Head: Normocephalic and atraumatic.  Right Ear: External ear normal.  Left Ear: External ear normal.  Nose: Nose normal.  Mouth/Throat: Oropharynx is clear and moist. No oropharyngeal exudate.  Eyes: Conjunctivae and EOM are normal. Pupils are equal, round, and reactive to light. Right eye exhibits no discharge. Left eye exhibits no discharge. No scleral icterus.  Neck: Normal range of motion. Neck supple. No JVD present. No tracheal deviation present. No thyromegaly present.  Cardiovascular: Normal rate, regular rhythm and intact distal pulses.  Exam reveals no gallop and no friction rub.   Murmur heard. Pulmonary/Chest: Effort normal and breath sounds normal. No stridor. No respiratory distress. She has no wheezes. She has no rales. She exhibits no tenderness.  Abdominal: Soft. Bowel sounds are normal. She exhibits no distension and no mass. There is no tenderness. There is no rebound and no guarding.  Musculoskeletal: Normal range of motion. She exhibits no edema or tenderness.  Lymphadenopathy:    She has no cervical adenopathy.  Neurological: She is alert and oriented to person, place, and time. She has normal reflexes. She displays normal reflexes. No cranial nerve deficit. She exhibits normal muscle tone. Coordination normal.  Skin: Skin is warm. No rash noted. She is not diaphoretic. No erythema. No pallor.  Psychiatric: She has a normal mood and affect. Her  behavior is  normal. Judgment and thought content normal.  Vitals reviewed.         Assessment & Plan:  Routine general medical examination at a health care facility  Since physical exam is completely normal. Her immunizations are up-to-date. Her cancer screening is up-to-date. Patient will get her mammogram and bone density in August. Her Pap smear is up-to-date. Her colonoscopy is scheduled for later this year. Patient's lab work is excellent. I recommended she discontinue Crestor. Also recommended she discontinue gabapentin. I would like to try to decrease the polypharmacy which may be contributing to her nausea. I believe her nausea and early satiety is likely anxiety related. If he continues to worsen we may need to focus our efforts on better controlling her anxiety.

## 2014-05-11 NOTE — Telephone Encounter (Signed)
Noted  

## 2014-05-12 ENCOUNTER — Encounter: Payer: Self-pay | Admitting: Physical Medicine & Rehabilitation

## 2014-05-12 MED ORDER — OMALIZUMAB 150 MG ~~LOC~~ SOLR
150.0000 mg | Freq: Once | SUBCUTANEOUS | Status: AC
  Administered 2014-05-11: 150 mg via SUBCUTANEOUS

## 2014-05-17 ENCOUNTER — Ambulatory Visit (INDEPENDENT_AMBULATORY_CARE_PROVIDER_SITE_OTHER): Payer: Medicare Other | Admitting: Family Medicine

## 2014-05-17 ENCOUNTER — Encounter: Payer: Self-pay | Admitting: Family Medicine

## 2014-05-17 VITALS — BP 110/60 | HR 76 | Temp 98.1°F | Resp 18 | Ht 62.0 in | Wt 106.0 lb

## 2014-05-17 DIAGNOSIS — M545 Low back pain, unspecified: Secondary | ICD-10-CM

## 2014-05-17 LAB — URINALYSIS, ROUTINE W REFLEX MICROSCOPIC
Bilirubin Urine: NEGATIVE
Glucose, UA: NEGATIVE mg/dL
Hgb urine dipstick: NEGATIVE
Leukocytes, UA: NEGATIVE
Nitrite: NEGATIVE
Protein, ur: NEGATIVE mg/dL
Specific Gravity, Urine: 1.01 (ref 1.005–1.030)
Urobilinogen, UA: 0.2 mg/dL (ref 0.0–1.0)
pH: 6.5 (ref 5.0–8.0)

## 2014-05-17 MED ORDER — PREDNISONE 20 MG PO TABS: ORAL_TABLET | ORAL | Status: DC

## 2014-05-17 MED ORDER — TIZANIDINE HCL 4 MG PO CAPS: 4.0000 mg | ORAL_CAPSULE | Freq: Three times a day (TID) | ORAL | Status: DC | PRN

## 2014-05-17 NOTE — Progress Notes (Signed)
Subjective:    Patient ID: Ebony Scott, female    DOB: 1950/12/12, 63 y.o.   MRN: [id number]  HPI Patient symptoms began Friday. She developed the sudden onset of right-sided lower back pain. It hurts to flex at the waist. It hurts to bend over. It hurts to twist at the waist. She denies any falls or injuries. She also has pain behind her right knee. She has no pain with flexion or extension of the knee. She does have some pain and tenderness to palpation in the posterior hamstring. She has pain in the right hip with internal and ext rotation. She has a negative straight leg raise. She has normal reflexes in the right leg. She has no muscle weakness in the right leg. There is no rash or evidence of shingles. She also has pain in the plantar aspect of the left foot near the insertion of the plantar fascia on the calcaneus. Urinalysis is negative for any sign of urinary tract infection. There is no evidence of blood, leukocyte esterase, or nitrites. Past Medical History  Diagnosis Date  . Chronic neck pain   . Allergic rhinitis   . Moderate persistent asthma     -FeV1 72% 2011, -IgE 102 2011, CT sinus Neg 2011  . GERD (gastroesophageal reflux disease)   . Hypothyroidism   . Osteoporosis     on reclast yearly  . Hiatal hernia   . Diverticulosis   . Hemorrhoids   . Asthma   . IBS (irritable bowel syndrome)   . Complication of anesthesia     "had hard time waking up from it several times" (02/20/2012)  . Hypercholesteremia   . Pneumonia 04/2011; ~ 11/2011    "double; single" (02/20/2012)  . Exertional dyspnea   . Graves disease   . Headache(784.0)     "related to allergies; more at different times during the year" (02/20/2012)  . Arthritis     "mostly the hands" (02/20/2012)  . Fibromyalgia 11/2011  . Chronic lower back pain   . Depression     "some; don't take anything for it" (02/20/2012)  . Breast cancer 1998    in remission  . Anxiety   . DVT (deep venous thrombosis)   . Allergy    SEASONAL  . Anemia   . Cataract     REMOVED   Past Surgical History  Procedure Laterality Date  . Cervical fusion  2003    C3-C4  . Cervical discectomy  10/2001    C5-C6  . Vesicovaginal fistula closure w/ tah  1988  . Nasal septum surgery  1980's  . Anterior and posterior repair  1990's  . Breast lumpectomy  1998    left  . Debridement tennis elbow  ?1970's    right  . Knee arthroplasty  ?1990's    "?right; w/cartilage repair" (02/20/2012)  . Carpometacarpel (cmc) fusion of thumb with autograft from radius  ~ 2009    "both thumbs" (02/20/2012)  . Cataract extraction w/ intraocular lens  implant, bilateral  2012  . Tonsillectomy  ~ 1953  . Posterior cervical fusion/foraminotomy  2004    "failed initial fusion; rewired  anterior neck" (02/20/2012)  . Colonoscopy    . Appendectomy    . Cholecystectomy    . Hysterectomy     Current Outpatient Prescriptions on File Prior to Visit  Medication Sig Dispense Refill  . acetaminophen (TYLENOL ARTHRITIS PAIN) 650 MG CR tablet Take 650 mg by mouth 2 (two) times daily.     Marland Kitchen  albuterol (PROAIR HFA) 108 (90 BASE) MCG/ACT inhaler Inhale 1-2 puffs into the lungs every 6 (six) hours as needed for wheezing or shortness of breath. 1 Inhaler 3  . Alpha-D-Galactosidase (BEANO) TABS Take 1 tablet by mouth daily.     . Alpha-Lipoic Acid 600 MG CAPS Take 600 mg by mouth daily. Once a day    . azelastine (ASTELIN) 0.1 % nasal spray Place 2 sprays into both nostrils daily. Use in each nostril as directed 30 mL 6  . Calcium Citrate-Vitamin D (CITRACAL MAXIMUM) 315-250 MG-UNIT TABS Take 1 tablet by mouth 2 (two) times daily.     . cetirizine (ZYRTEC) 10 MG tablet Take 10 mg by mouth daily.    . Cholecalciferol (VITAMIN D) 2000 UNITS CAPS 1/2 at bedtime    . colchicine 0.6 MG tablet     . cyproheptadine (PERIACTIN) 4 MG tablet     . denosumab (PROLIA) 60 MG/ML SOLN injection Inject 60 mg into the skin every 6 (six) months. Administer in upper arm, thigh, or  abdomen    . dexlansoprazole (DEXILANT) 60 MG capsule Take 60 mg by mouth daily.    . DULERA 100-5 MCG/ACT AERO INHALE 1 PUFF BY MOUTH TWICE DAILY 13 g 6  . EPINEPHrine (EPIPEN 2-PAK) 0.3 mg/0.3 mL IJ SOAJ injection Inject 0.3 mLs (0.3 mg total) into the muscle once. 1 Device 4  . gabapentin (NEURONTIN) 600 MG tablet Take 1 tablet (600 mg total) by mouth daily. 90 tablet 3  . hydrocortisone (PROCTOZONE-HC) 2.5 % rectal cream Apply as needed as directed 30 g 11  . lactase (LACTAID) 3000 UNITS tablet Take 3 tablets by mouth as needed. 3-4 tabs with dairy liquids and solids    . lidocaine (LIDODERM) 5 % Place 1 patch onto the skin daily as needed. Remove & Discard patch within 12 hours or as directed by MD 30 patch 11  . mirtazapine (REMERON SOL-TAB) 15 MG disintegrating tablet Take 1 tablet (15 mg total) by mouth at bedtime. 30 tablet 2  . mometasone (NASONEX) 50 MCG/ACT nasal spray Place 2 sprays into the nose 2 (two) times daily. 17 g 11  . montelukast (SINGULAIR) 10 MG tablet TAKE 1 TABLET BY MOUTH ONCE DAILY 30 tablet 11  . neomycin-bacitracin-polymyxin (NEOSPORIN) ointment Apply 1 application topically every 12 (twelve) hours. Use on/in nose as directed as needed    . nystatin (MYCOSTATIN) 100000 UNIT/ML suspension Use as directed 5 mLs in the mouth or throat. Swish and spit out twice daily    . nystatin-triamcinolone ointment (MYCOLOG) as needed.    Marland Kitchen omalizumab (XOLAIR) 150 MG injection Inject 150 mg into the skin every 28 (twenty-eight) days. 150 SQ every 4 weeks    . Polyethyl Glycol-Propyl Glycol (SYSTANE ULTRA) 0.4-0.3 % SOLN Apply 1 drop to eye 2 (two) times daily as needed. 1-2 drops each eye every morning and at bedtime    . Prenatal Vit-Sel-Fe Fum-FA (VINATE M) 27-1 MG TABS Take 1 tablet by mouth daily. 30 each 11  . Probiotic Product (ALIGN) 4 MG CAPS Take 1 tablet by mouth daily. Once a day    . promethazine (PHENERGAN) 25 MG tablet Take 1 tablet (25 mg total) by mouth every 8  (eight) hours as needed for nausea or vomiting. 30 tablet 0  . propylthiouracil (PTU) 50 MG tablet take 1/2 tablet (25 mg) twice daily 30 tablet 5  . pseudoephedrine-guaifenesin (MUCINEX D) 60-600 MG per tablet Take 1 tablet by mouth every 12 (twelve) hours. [place]  tablet 3  . rosuvastatin (CRESTOR) 10 MG tablet TAKE 1 TABLET BY MOUTH DAILY 30 tablet 11  . Simethicone (GAS-X EXTRA STRENGTH) 125 MG CAPS Take 2 each by mouth daily as needed. For gas relief    . [DISCONTINUED] methimazole (TAPAZOLE) 5 MG tablet Take 5 mg by mouth 3 (three) times daily.       No current facility-administered medications on file prior to visit.   Allergies  Allergen Reactions  . Dust Mite Extract Shortness Of Breath and Other (See Comments)    "sneezing" (02/20/2012)  . Molds & Smuts Shortness Of Breath  . Morphine Sulfate Itching  . Other Shortness Of Breath and Other (See Comments)    Grass and weeds "sneezing; filled sinuses" (02/20/2012)  . Penicillins Rash and Other (See Comments)    "welts" (02/20/2012)  . Reclast [Zoledronic Acid]     Put in hospital  . Rofecoxib Swelling    Celebrex REACTION: feet swelling  . Shrimp Flavor Anaphylaxis    ALL SHELLFISH  . Tetracycline Hcl Nausea And Vomiting  . Dilaudid [Hydromorphone Hcl] Itching  . Hydrocodone-Acetaminophen Nausea And Vomiting  . Levofloxacin Other (See Comments)    REACTION: GI upset  . Oxycodone Hcl Nausea And Vomiting  . Paroxetine Nausea And Vomiting    Paxil   . Tree Extract Other (See Comments)    "tested and told I was allergic to it; never experienced a reaction to it" (02/20/2012)   History   Social History  . Marital Status: Divorced    Spouse Name: N/A  . Number of Children: 2  . Years of Education: college   Occupational History  . Disabled     Retired Print production planner  .     Social History Main Topics  . Smoking status: Never Smoker   . Smokeless tobacco: Never Used  . Alcohol Use: 0.0 oz/week    0 Standard  drinks or equivalent per week     Comment: 02/20/2012 "couple glasses of wine/6 months"  . Drug Use: No  . Sexual Activity: No   Other Topics Concern  . Not on file   Social History Narrative   Patient lives at home alone. Patient  divorced.    Patient has her BS degree.   Right handed.   Caffeine- sometimes coffee.               Review of Systems  All other systems reviewed and are negative.      Objective:   Physical Exam  Cardiovascular: Normal rate, regular rhythm and normal heart sounds.   Pulmonary/Chest: Effort normal and breath sounds normal. No respiratory distress. She has no wheezes. She has no rales.  Abdominal: Soft. Bowel sounds are normal. She exhibits no distension. There is no tenderness. There is no rebound and no guarding.  Musculoskeletal:       Right knee: She exhibits normal range of motion, no swelling, no effusion, no erythema, normal alignment, no LCL laxity, no bony tenderness, normal meniscus and no MCL laxity. No tenderness found. No medial joint line, no lateral joint line, no MCL and no LCL tenderness noted.       Left ankle: She exhibits normal range of motion.       Lumbar back: She exhibits decreased range of motion, tenderness, pain and spasm. She exhibits no bony tenderness.       Right upper leg: She exhibits tenderness. She exhibits no bony tenderness.       Left foot:  There is tenderness. There is normal range of motion.  Vitals reviewed.         Assessment & Plan:  Bilateral low back pain without sciatica - Plan: Urinalysis, Routine w reflex microscopic, tiZANidine (ZANAFLEX) 4 MG capsule, predniSONE (DELTASONE) 20 MG tablet  I believe the patient has strained her lower back and has strained a lumbar paraspinal muscle. Her pain is severe. It is so severe she consider going to the emergency room. Therefore I recommended she use oxycodone 1 pill every 6 hours as needed for pain. I will also start the patient on prednisone taper pack as  an anti-inflammatory for the muscle irritation and inflammation that I believe she is suffering from. She can also use tizanidine 4 mg every 8 hours as needed for muscle spasms. I recommended relative rest and heat be applied to the area. A blue she also has plantar fasciitis in her left foot which is likely developed due to compensating as she walks for the pain in her right lower back and in her right upper  thigh.

## 2014-05-18 ENCOUNTER — Telehealth: Payer: Self-pay | Admitting: Critical Care Medicine

## 2014-05-18 NOTE — Telephone Encounter (Signed)
Rec'd from [facility] forward 5 pages to Dr. Joya Gaskins

## 2014-05-31 ENCOUNTER — Encounter: Payer: Medicare Other | Admitting: Internal Medicine

## 2014-05-31 ENCOUNTER — Encounter: Payer: Self-pay | Admitting: Physical Medicine & Rehabilitation

## 2014-05-31 ENCOUNTER — Encounter: Payer: Medicare Other | Attending: Physical Medicine & Rehabilitation

## 2014-05-31 ENCOUNTER — Other Ambulatory Visit: Payer: Self-pay | Admitting: Physical Medicine & Rehabilitation

## 2014-05-31 ENCOUNTER — Ambulatory Visit (HOSPITAL_BASED_OUTPATIENT_CLINIC_OR_DEPARTMENT_OTHER): Payer: Medicare Other | Admitting: Physical Medicine & Rehabilitation

## 2014-05-31 VITALS — BP 128/66 | HR 79 | Resp 14

## 2014-05-31 DIAGNOSIS — M961 Postlaminectomy syndrome, not elsewhere classified: Secondary | ICD-10-CM

## 2014-05-31 DIAGNOSIS — Z79899 Other long term (current) drug therapy: Secondary | ICD-10-CM | POA: Diagnosis not present

## 2014-05-31 DIAGNOSIS — Z5181 Encounter for therapeutic drug level monitoring: Secondary | ICD-10-CM | POA: Diagnosis not present

## 2014-05-31 DIAGNOSIS — G894 Chronic pain syndrome: Secondary | ICD-10-CM

## 2014-05-31 DIAGNOSIS — M47816 Spondylosis without myelopathy or radiculopathy, lumbar region: Secondary | ICD-10-CM | POA: Insufficient documentation

## 2014-05-31 NOTE — Patient Instructions (Signed)
We'll try physical therapy for back and neck symptoms  We'll schedule for lumbar medial branch blocks, we'll check for insurance approval and estimate out of pocket costs
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
## 2014-06-01 ENCOUNTER — Telehealth: Payer: Self-pay | Admitting: *Deleted

## 2014-06-01 LAB — PRESCRIPTION MONITORING PROFILE (SOLSTAS)
Amphetamine/Meth: NEGATIVE ng/mL
Barbiturate Screen, Urine: NEGATIVE ng/mL
Benzodiazepine Screen, Urine: NEGATIVE ng/mL
Buprenorphine, Urine: NEGATIVE ng/mL
Cannabinoid Scrn, Ur: NEGATIVE ng/mL
Carisoprodol, Urine: NEGATIVE ng/mL
Cocaine Metabolites: NEGATIVE ng/mL
Creatinine, Urine: 17.81 mg/dL — ABNORMAL LOW (ref >20.0)
Fentanyl, Ur: NEGATIVE ng/mL
MDMA URINE: NEGATIVE ng/mL
Meperidine, Ur: NEGATIVE ng/mL
Methadone Screen, Urine: NEGATIVE ng/mL
Nitrites, Initial: NEGATIVE ug/mL
Opiate Screen, Urine: NEGATIVE ng/mL
Oxycodone Screen, Ur: NEGATIVE ng/mL
Propoxyphene: NEGATIVE ng/mL
Tapentadol, urine: NEGATIVE ng/mL
Tramadol Scrn, Ur: NEGATIVE ng/mL
Zolpidem, Urine: NEGATIVE ng/mL
pH, Initial: 5.5 pH (ref 4.5–8.9)

## 2014-06-01 LAB — PMP ALCOHOL METABOLITE (ETG): Ethyl Glucuronide (EtG): NEGATIVE ng/mL

## 2014-06-01 MED ORDER — LIDOCAINE 5 % EX OINT: 1.0000 "application " | TOPICAL_OINTMENT | CUTANEOUS | Status: DC | PRN

## 2014-06-01 NOTE — Telephone Encounter (Signed)
Ebony Scott had appt with Ebony Scott yesterday and he plans to do a back injection next month.  She is having pain in her neck an it has worsened.  She is taking gabapentin and arthritis strength tylenol and using heat.  It is not working She is asking what he has in mind about her neck.  She used to use lidocaine patches but insurance will not cover them and she does not want narcotics.

## 2014-06-01 NOTE — Telephone Encounter (Signed)
I notified Meyer.  She said you were not going to do trigger points in her neck because of all the hardware (was she talking about cervical nerve blocks?)  She will try the lidocaine ointment (I placed order and appears to agree with insurance plan). If this does not work perhaps we can try for the lidoderm patches again after a failure.

## 2014-06-01 NOTE — Telephone Encounter (Signed)
I'm planning trigger point injections for the neck She could try some lidocaine cream or ointment 5% applied to the neck 2 or 3 times per day

## 2014-06-04 ENCOUNTER — Telehealth: Payer: Self-pay | Admitting: Family Medicine

## 2014-06-04 DIAGNOSIS — G8929 Other chronic pain: Secondary | ICD-10-CM

## 2014-06-04 DIAGNOSIS — M542 Cervicalgia: Principal | ICD-10-CM

## 2014-06-04 NOTE — Telephone Encounter (Signed)
Pt called stating that she went to her pain mgmt appointment with Dr. Letta Pate and stated that she does not want to go back to them d/t having to pay a facility and doctor fee of 211.00+each time she goes to them and can not afford this. Pt wants to know if you can refer to another pain mgmt that does not charge this kind of fee. Pt called her insurance co and they told her a couple of places that would not charge. But wants your opinion on these providers before referral is made.  Dr. Leandrew Koyanagi [address]  Dr. Arvilla Market at [facility]  Or [facility] Dr.Scheuler or [facility]  Please advise!

## 2014-06-07 NOTE — Telephone Encounter (Signed)
We have patients who see Arvilla Market and like that clinic.

## 2014-06-07 NOTE — Telephone Encounter (Signed)
Referral placed to Dr. Ace Gins

## 2014-06-08 ENCOUNTER — Ambulatory Visit (INDEPENDENT_AMBULATORY_CARE_PROVIDER_SITE_OTHER): Payer: Medicare Other

## 2014-06-08 DIAGNOSIS — J4550 Severe persistent asthma, uncomplicated: Secondary | ICD-10-CM | POA: Diagnosis not present

## 2014-06-09 MED ORDER — OMALIZUMAB 150 MG ~~LOC~~ SOLR
150.0000 mg | Freq: Once | SUBCUTANEOUS | Status: AC
  Administered 2014-06-08: 150 mg via SUBCUTANEOUS

## 2014-06-09 NOTE — Progress Notes (Signed)
Urine drug screen for this encounter is consistent for having no prescribed medication

## 2014-06-14 IMAGING — CR DG CHEST 2V
2 series · 2 of 2 positions shown · non-contrast
Comparison: 06/05/2011

CLINICAL DATA: Cough, shortness of breath, congestion, asthma,
bronchitis

CHEST - 2 VIEW

[view not recorded (1 of 2)]
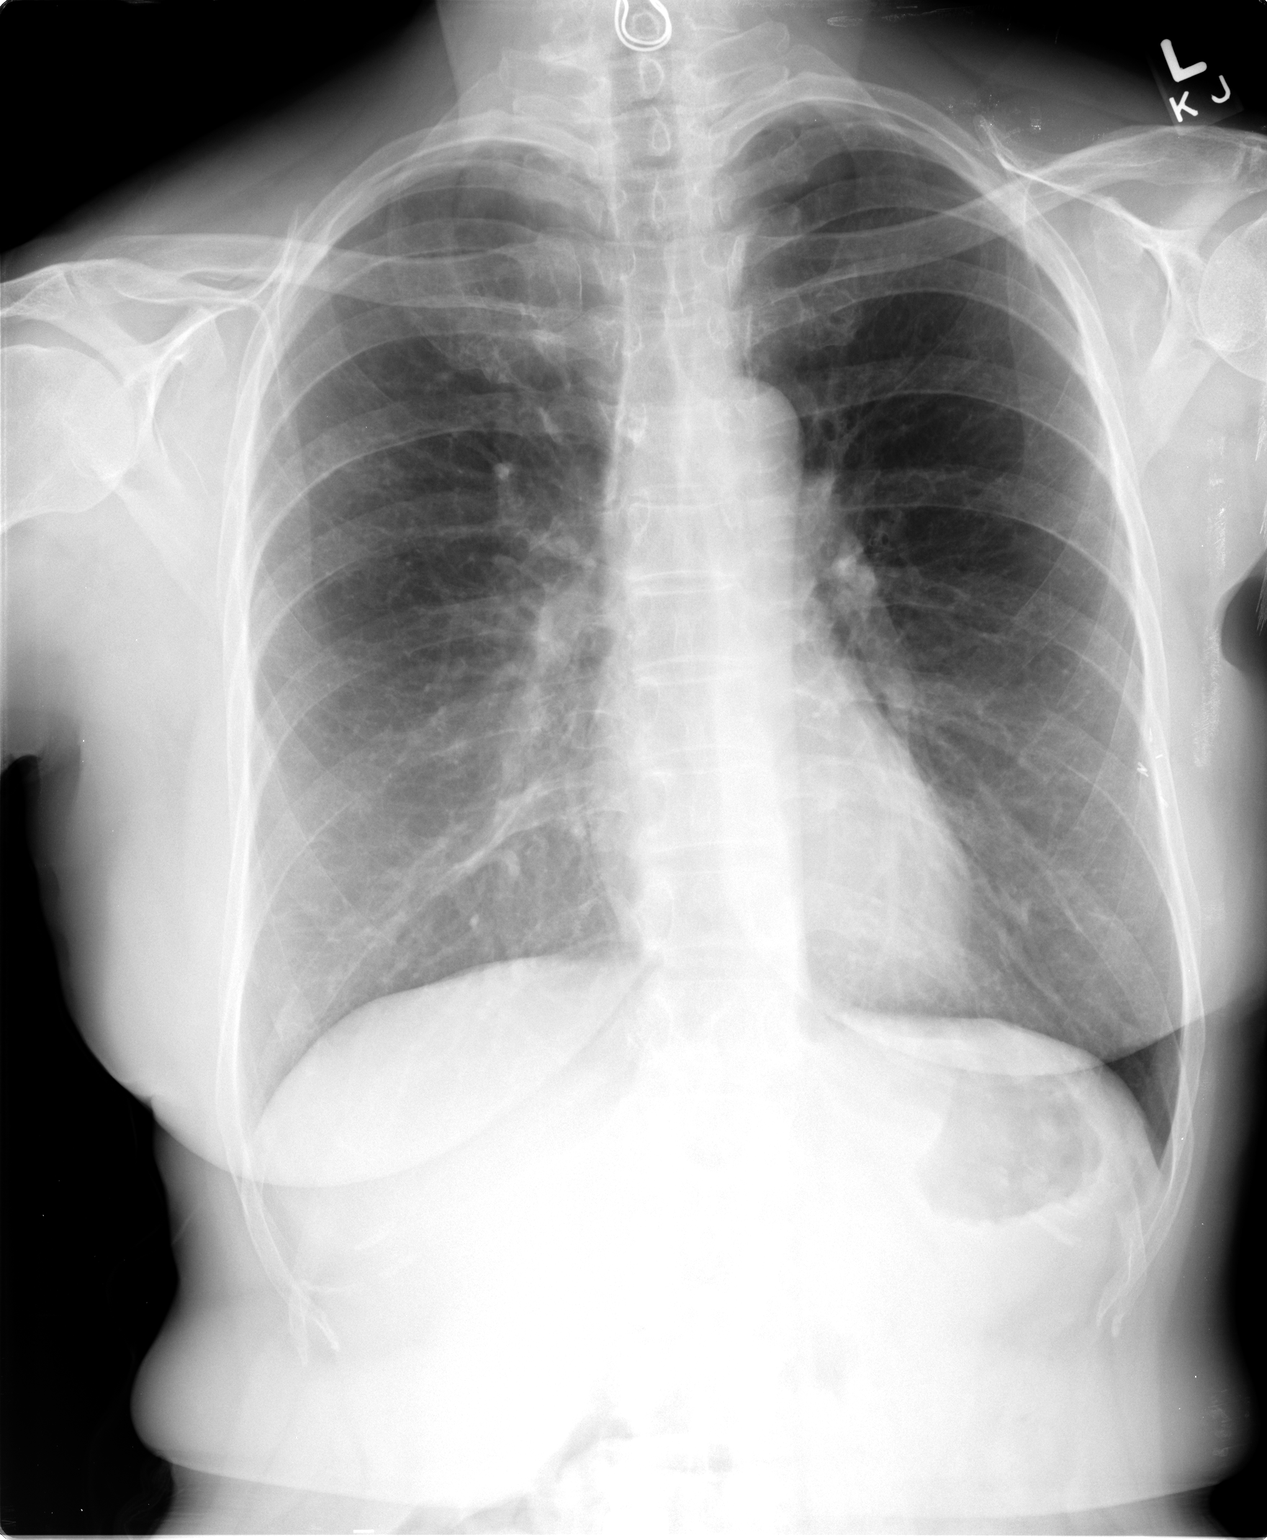

[view not recorded (2 of 2)]
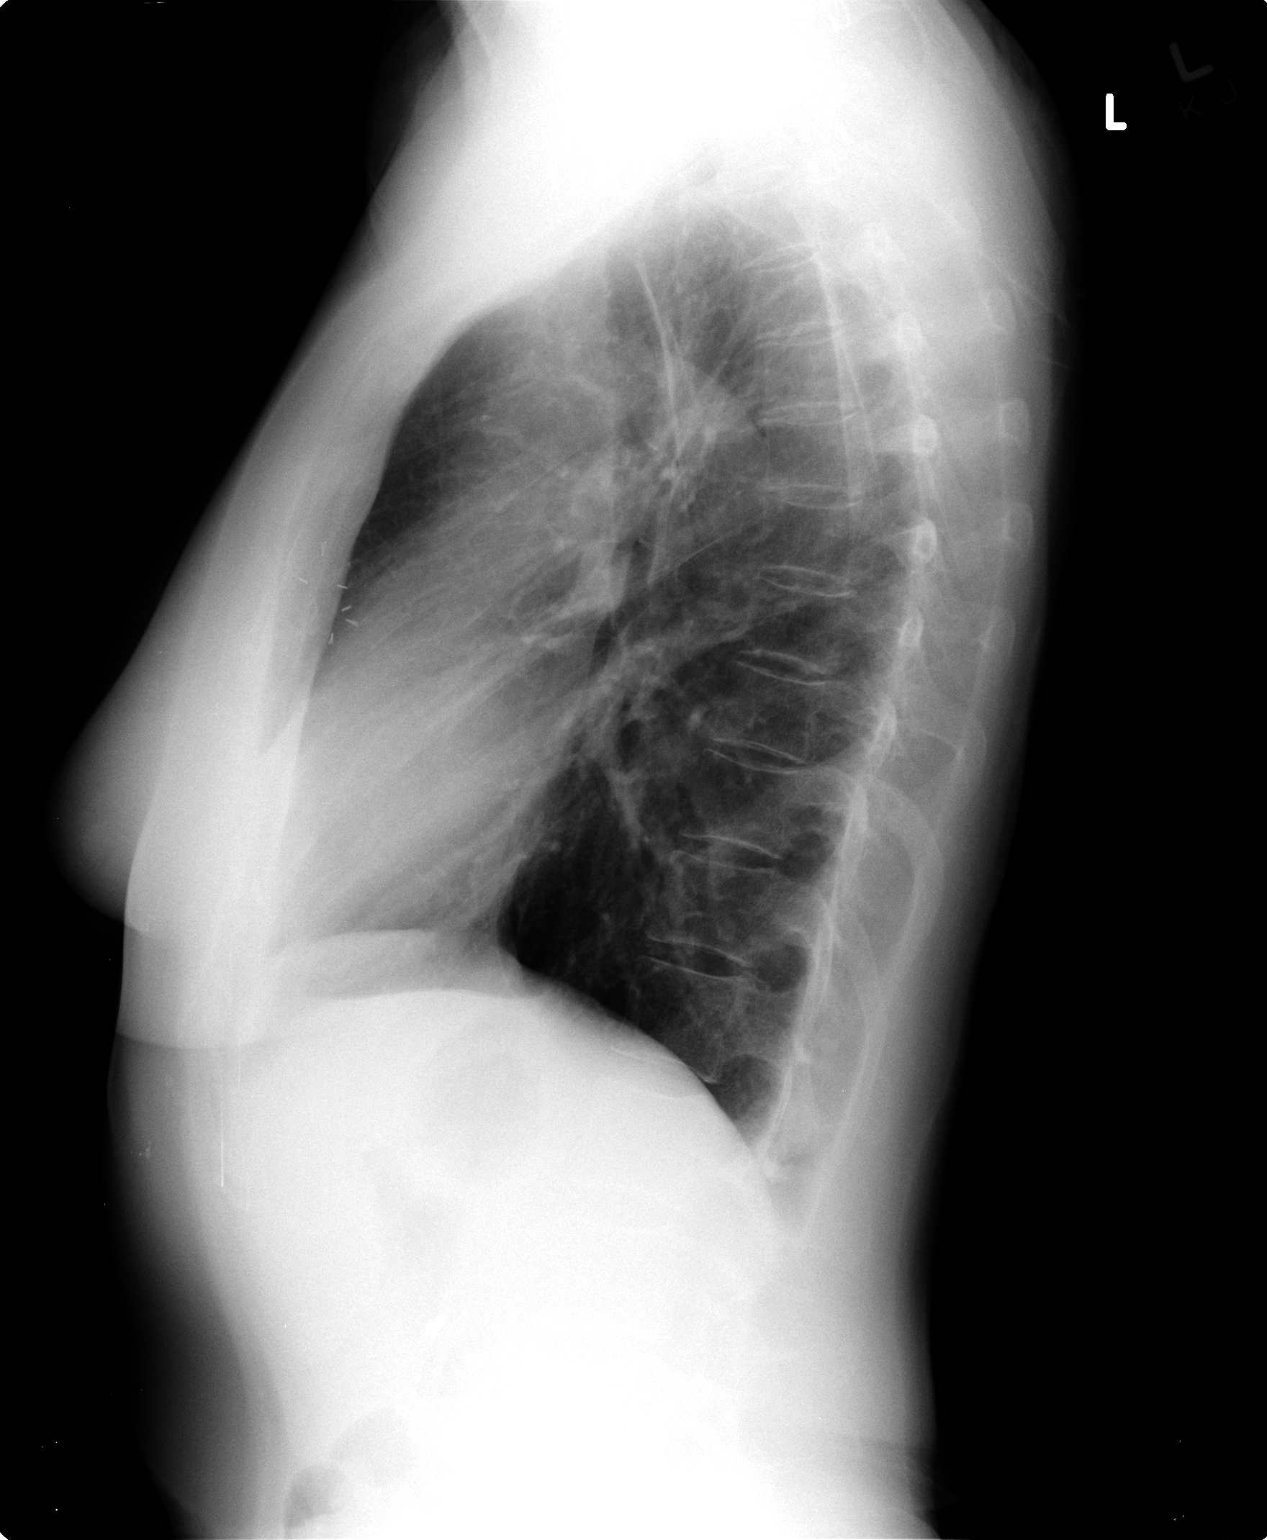

[2 of 2 positions shown; findings below may reference images not displayed]

FINDINGS: Normal heart size, mediastinal contours, and pulmonary vascularity.
Lungs minimally hyperexpanded with a vague area of opacity in the
right upper lobe, could represent subtle infiltrate though
underlying etiologies not excluded.
Minimal interstitial prominence at the lower lungs.
No pleural effusion or pneumothorax.
No acute osseous findings.
Surgical clips upper outer left breast.
IMPRESSION: Questionable vague area of infiltrate in the right upper lobe.
However radiographic follow-up until resolution is recommended to
ensure resolution and exclude underlying pulmonary nodule.

## 2014-06-15 ENCOUNTER — Other Ambulatory Visit: Payer: Self-pay | Admitting: Obstetrics and Gynecology

## 2014-06-15 DIAGNOSIS — M8580 Other specified disorders of bone density and structure, unspecified site: Secondary | ICD-10-CM

## 2014-06-16 ENCOUNTER — Other Ambulatory Visit: Payer: Self-pay | Admitting: Family Medicine

## 2014-06-16 MED ORDER — HYDROCORTISONE 2.5 % RE CREA: TOPICAL_CREAM | RECTAL | Status: DC

## 2014-06-17 ENCOUNTER — Telehealth: Payer: Self-pay | Admitting: Family Medicine

## 2014-06-17 ENCOUNTER — Telehealth: Payer: Self-pay | Admitting: *Deleted

## 2014-06-17 NOTE — Telephone Encounter (Signed)
Patient is calling to speak with you about a referral and sending records to another doctor  [phone]

## 2014-06-17 NOTE — Telephone Encounter (Signed)
Derinda has called back to let Zella Ball know that the lidocaine ointment that she prescribed is not working for her because she is unable to reach the spot that needs to be addressed.  At least with the lidoderm patched she was able to lean up against a wall to get them in place on her back.  She would like [place] to try again to get them approved as they improved the quality of her life.

## 2014-06-21 NOTE — Telephone Encounter (Signed)
Contacted pt back and she had inquired about her records to make sure they were sent to Dr. Ace Gins office for review before her appt on Wednesday may 4. I informed the pt that all the records that pertinent to her visit was already faxed when I faxed her referral to Dr. Ace Gins.

## 2014-06-23 ENCOUNTER — Encounter: Payer: Self-pay | Admitting: Internal Medicine

## 2014-06-23 ENCOUNTER — Encounter: Payer: Self-pay | Admitting: *Deleted

## 2014-06-23 ENCOUNTER — Ambulatory Visit (INDEPENDENT_AMBULATORY_CARE_PROVIDER_SITE_OTHER): Payer: Medicare Other | Admitting: Internal Medicine

## 2014-06-23 ENCOUNTER — Telehealth: Payer: Self-pay | Admitting: *Deleted

## 2014-06-23 VITALS — BP 152/70 | HR 88 | Ht 61.25 in | Wt 110.6 lb

## 2014-06-23 DIAGNOSIS — K589 Irritable bowel syndrome without diarrhea: Secondary | ICD-10-CM | POA: Diagnosis not present

## 2014-06-23 DIAGNOSIS — R110 Nausea: Secondary | ICD-10-CM

## 2014-06-23 DIAGNOSIS — G4700 Insomnia, unspecified: Secondary | ICD-10-CM

## 2014-06-23 MED ORDER — MIRTAZAPINE 30 MG PO TABS
30.0000 mg | ORAL_TABLET | Freq: Every day | ORAL | Status: DC
Start: 2014-06-23 — End: 2014-07-12

## 2014-06-23 NOTE — Progress Notes (Signed)
Subjective:    Patient ID: Ebony Scott, female    DOB: 1950-02-29, 64 y.o.   MRN: [id number] Chief complaint: Nausea follow-up, IBS with multiple bowel movements HPI The patient is seen after upper endoscopy in March. It was normal. Because of insomnia anorexia and nausea I started Remeron 15 mg generic at bedtime. She has improved sleep. Nausea is less. She is complaining about having up to 7 bowel movements though formed often small stools in a day at times. She recently saw a new pain specialist, Dr. Ace Gins and is due for possible injections into her spine tomorrow. She was given samples of a 3 mg doxepin medication to help her sleep and reduce pain. She is also been given samples of a diclofenac to take. Prescription is for both of these.  Wt Readings from Last 3 Encounters:  06/23/14 110 lb 9.6 oz (50.168 kg)  05/17/14 106 lb (48.081 kg)  05/11/14 108 lb (48.988 kg)   Allergies  Allergen Reactions  . Dust Mite Extract Shortness Of Breath and Other (See Comments)    "sneezing" (02/20/2012)  . Molds & Smuts Shortness Of Breath  . Morphine Sulfate Itching  . Other Shortness Of Breath and Other (See Comments)    Grass and weeds "sneezing; filled sinuses" (02/20/2012)  . Penicillins Rash and Other (See Comments)    "welts" (02/20/2012)  . Reclast [Zoledronic Acid]     Put in hospital  . Rofecoxib Swelling    Celebrex REACTION: feet swelling  . Shrimp Flavor Anaphylaxis    ALL SHELLFISH  . Tetracycline Hcl Nausea And Vomiting  . Dilaudid [Hydromorphone Hcl] Itching  . Hydrocodone-Acetaminophen Nausea And Vomiting  . Levofloxacin Other (See Comments)    REACTION: GI upset  . Oxycodone Hcl Nausea And Vomiting  . Paroxetine Nausea And Vomiting    Paxil   . Tree Extract Other (See Comments)    "tested and told I was allergic to it; never experienced a reaction to it" (02/20/2012)   Outpatient Prescriptions Prior to Visit  Medication Sig Dispense Refill  . acetaminophen (TYLENOL  ARTHRITIS PAIN) 650 MG CR tablet Take 650 mg by mouth 2 (two) times daily.     Marland Kitchen albuterol (PROAIR HFA) 108 (90 BASE) MCG/ACT inhaler Inhale 1-2 puffs into the lungs every 6 (six) hours as needed for wheezing or shortness of breath. 1 Inhaler 3  . Alpha-D-Galactosidase (BEANO) TABS Take 1 tablet by mouth daily.     . Alpha-Lipoic Acid 600 MG CAPS Take 600 mg by mouth daily. Once a day    . azelastine (ASTELIN) 0.1 % nasal spray Place 2 sprays into both nostrils daily. Use in each nostril as directed 30 mL 6  . Calcium Citrate-Vitamin D (CITRACAL MAXIMUM) 315-250 MG-UNIT TABS Take 1 tablet by mouth 2 (two) times daily.     . cetirizine (ZYRTEC) 10 MG tablet Take 10 mg by mouth daily.    . Cholecalciferol (VITAMIN D) 2000 UNITS CAPS 1/2 at bedtime    . colchicine 0.6 MG tablet     . denosumab (PROLIA) 60 MG/ML SOLN injection Inject 60 mg into the skin every 6 (six) months. Administer in upper arm, thigh, or abdomen    . dexlansoprazole (DEXILANT) 60 MG capsule Take 60 mg by mouth daily.    . DULERA 100-5 MCG/ACT AERO INHALE 1 PUFF BY MOUTH TWICE DAILY 13 g 6  . EPINEPHrine (EPIPEN 2-PAK) 0.3 mg/0.3 mL IJ SOAJ injection Inject 0.3 mLs (0.3 mg total) into the muscle  once. 1 Device 4  . gabapentin (NEURONTIN) 600 MG tablet Take 1 tablet (600 mg total) by mouth daily. 90 tablet 3  . hydrocortisone (PROCTOZONE-HC) 2.5 % rectal cream Apply as needed as directed 30 g 11  . lactase (LACTAID) 3000 UNITS tablet Take 3 tablets by mouth as needed. 3-4 tabs with dairy liquids and solids    . lidocaine (XYLOCAINE) 5 % ointment Apply 1 application topically as needed. To neck 35.44 g 0  . mometasone (NASONEX) 50 MCG/ACT nasal spray Place 2 sprays into the nose 2 (two) times daily. 17 g 11  . montelukast (SINGULAIR) 10 MG tablet TAKE 1 TABLET BY MOUTH ONCE DAILY 30 tablet 11  . neomycin-bacitracin-polymyxin (NEOSPORIN) ointment Apply 1 application topically every 12 (twelve) hours. Use on/in nose as directed as  needed    . nystatin (MYCOSTATIN) 100000 UNIT/ML suspension Use as directed 5 mLs in the mouth or throat. Swish and spit out twice daily    . nystatin-triamcinolone ointment (MYCOLOG) as needed.    Marland Kitchen omalizumab (XOLAIR) 150 MG injection Inject 150 mg into the skin every 28 (twenty-eight) days. 150 SQ every 4 weeks    . Polyethyl Glycol-Propyl Glycol (SYSTANE ULTRA) 0.4-0.3 % SOLN Apply 1 drop to eye 2 (two) times daily as needed. 1-2 drops each eye every morning and at bedtime    . Prenatal Vit-Sel-Fe Fum-FA (VINATE M) 27-1 MG TABS Take 1 tablet by mouth daily. 30 each 11  . Probiotic Product (ALIGN) 4 MG CAPS Take 1 tablet by mouth daily. Once a day    . propylthiouracil (PTU) 50 MG tablet take 1/2 tablet (25 mg) twice daily 30 tablet 5  . pseudoephedrine-guaifenesin (MUCINEX D) 60-600 MG per tablet Take 1 tablet by mouth every 12 (twelve) hours. 60 tablet 3  . Simethicone (GAS-X EXTRA STRENGTH) 125 MG CAPS Take 2 each by mouth daily as needed. For gas relief    . tiZANidine (ZANAFLEX) 4 MG capsule Take 1 capsule (4 mg total) by mouth 3 (three) times daily as needed for muscle spasms. 30 capsule 0  . mirtazapine (REMERON SOL-TAB) 15 MG disintegrating tablet Take 1 tablet (15 mg total) by mouth at bedtime. 30 tablet 2  . cyproheptadine (PERIACTIN) 4 MG tablet     . predniSONE (DELTASONE) 20 MG tablet 3 tabs poqday 1-2, 2 tabs poqday 3-4, 1 tab poqday 5-6 (Patient not taking: Reported on 05/31/2014) 12 tablet 0  . promethazine (PHENERGAN) 25 MG tablet Take 1 tablet (25 mg total) by mouth every 8 (eight) hours as needed for nausea or vomiting. 30 tablet 0  . rosuvastatin (CRESTOR) 10 MG tablet TAKE 1 TABLET BY MOUTH DAILY (Patient not taking: Reported on 05/31/2014) 30 tablet 11   No facility-administered medications prior to visit.   Past Medical History  Diagnosis Date  . Chronic neck pain   . Allergic rhinitis   . Moderate persistent asthma     -FeV1 72% 2011, -IgE 102 2011, CT sinus Neg 2011    . GERD (gastroesophageal reflux disease)   . Hypothyroidism   . Osteoporosis     on reclast yearly  . Hiatal hernia   . Diverticulosis   . Hemorrhoids   . Asthma   . IBS (irritable bowel syndrome)   . Complication of anesthesia     "had hard time waking up from it several times" (02/20/2012)  . Hypercholesteremia   . Pneumonia 04/2011; ~ 11/2011    "double; single" (02/20/2012)  . Exertional dyspnea   .  Graves disease   . Headache(784.0)     "related to allergies; more at different times during the year" (02/20/2012)  . Arthritis     "mostly the hands" (02/20/2012)  . Fibromyalgia 11/2011  . Chronic lower back pain   . Depression     "some; don't take anything for it" (02/20/2012)  . Breast cancer 1998    in remission  . Anxiety   . DVT (deep venous thrombosis)   . Allergy     SEASONAL  . Anemia   . Cataract     REMOVED   Past Surgical History  Procedure Laterality Date  . Cervical fusion  2003    C3-C4  . Cervical discectomy  10/2001    C5-C6  . Vesicovaginal fistula closure w/ tah  1988  . Nasal septum surgery  1980's  . Anterior and posterior repair  1990's  . Breast lumpectomy  1998    left  . Debridement tennis elbow  ?1970's    right  . Knee arthroplasty  ?1990's    "?right; w/cartilage repair" (02/20/2012)  . Carpometacarpel ([facility]) fusion of thumb with autograft from radius  ~ 2009    "both thumbs" (02/20/2012)  . Cataract extraction w/ intraocular lens  implant, bilateral  2012  . Tonsillectomy  ~ 1953  . Posterior cervical fusion/foraminotomy  2004    "failed initial fusion; rewired  anterior neck" (02/20/2012)  . Colonoscopy    . Appendectomy    . Cholecystectomy    . Hysterectomy    . Esophagogastroduodenoscopy     History   Social History  . Marital Status: Divorced    Spouse Name: N/A  . Number of Children: 2  . Years of Education: college   Occupational History  . Disabled     Retired Print production planner  .     Social History Main  Topics  . Smoking status: Never Smoker   . Smokeless tobacco: Never Used  . Alcohol Use: 0.0 oz/week    0 Standard drinks or equivalent per week     Comment: 02/20/2012 "couple glasses of wine/6 months"  . Drug Use: No  . Sexual Activity: No   Other Topics Concern  . None   Social History Narrative   Patient lives at home alone. Patient  divorced.    Patient has her BS degree.   Right handed.   Caffeine- sometimes coffee.            Family History  Problem Relation Age of Onset  . Allergies Mother   . Heart disease Mother   . Arthritis Mother   . Lung cancer Mother   . Diabetes Mother   . Allergies Father   . Heart disease Father   . Arthritis Father   . Colon cancer      Maternal half aunt/Maternal half uncle  . Colitis Daughter   . Diabetes Maternal Grandfather     Review of Systems Still has a fair amount of anxiety summer which is about medical bills, she is concerned that our billing did not get it right for her recent EGD and has questions about that.    Objective:   Physical Exam BP 152/70 mmHg  Pulse 88  Ht 5' 1.25" (1.556 m)  Wt 110 lb 9.6 oz (50.168 kg)  BMI 20.72 kg/m2 Thin elderly white woman looking younger than stated age in no acute distress       Assessment & Plan:   1. Nausea without vomiting  2. Insomnia   3. IBS (irritable bowel syndrome)    I think she is improved and will increase Remeron generic to 30 mg at bedtime to help her sleep. She may not need the doxepin with this and I told her to talk to Dr. Ace Gins about this. Would try to reduce polypharmacy which is an issue with her.  She will try FODMAPS diet for what I think is IBS problems.  I will see her again in about 2-3 months sooner if needed.  Cc: Dr. Ace Gins

## 2014-06-23 NOTE — Telephone Encounter (Signed)
Submitted PA for Lidoderm patches # 60 - apply one patch to back q12h - patient cannot tolerate NSAIDs or narcotics.

## 2014-06-23 NOTE — Patient Instructions (Addendum)
Today you have been given a FODMAP to read and follow.   Dr. Carlean Purl is changing your dosage on your mirtazapine, increasing it to 30mg  every day at bedtime.    We will see you at your next appointment on July 12th at 10:30AM    I appreciate the opportunity to care for you. Silvano Rusk, M.D., [facility]

## 2014-06-23 NOTE — Telephone Encounter (Signed)
Recd fax denial for lidoderm patches - will try for an appeal due to the patients inability to tolerate NSAIDs and Narcotics.

## 2014-06-29 ENCOUNTER — Telehealth: Payer: Self-pay | Admitting: Internal Medicine

## 2014-06-29 NOTE — Telephone Encounter (Signed)
Answered questions about the FODMAP. She will call back with any other questions regarding the diet.

## 2014-07-05 ENCOUNTER — Telehealth: Payer: Self-pay | Admitting: Internal Medicine

## 2014-07-05 NOTE — Telephone Encounter (Signed)
Patient wanted to let Dr. Carlean Purl know that Dr. Joya Gaskins took her off Creon back in March. She forgot to tell him. She wants to know if she should restart this. Please, advise.

## 2014-07-06 ENCOUNTER — Ambulatory Visit: Payer: Medicare Other | Admitting: Physical Medicine & Rehabilitation

## 2014-07-06 ENCOUNTER — Ambulatory Visit (INDEPENDENT_AMBULATORY_CARE_PROVIDER_SITE_OTHER): Payer: Medicare Other

## 2014-07-06 DIAGNOSIS — J4520 Mild intermittent asthma, uncomplicated: Secondary | ICD-10-CM | POA: Diagnosis not present

## 2014-07-06 MED ORDER — OMALIZUMAB 150 MG ~~LOC~~ SOLR
150.0000 mg | Freq: Once | SUBCUTANEOUS | Status: AC
  Administered 2014-07-06: 150 mg via SUBCUTANEOUS

## 2014-07-06 NOTE — Telephone Encounter (Signed)
Left a message for patient to call back. 

## 2014-07-06 NOTE — Telephone Encounter (Signed)
Pt called back. Can nurse please call back tomorrow around 9ish or after 3pm?

## 2014-07-06 NOTE — Telephone Encounter (Signed)
Restarting that makes sense since nausea still a problem (he stopped to see if nausea would be better) Ebony Scott can help with bloating

## 2014-07-07 ENCOUNTER — Telehealth: Payer: Self-pay | Admitting: Critical Care Medicine

## 2014-07-07 NOTE — Telephone Encounter (Signed)
Left message for patient to call back  

## 2014-07-07 NOTE — Telephone Encounter (Signed)
Patient notified

## 2014-07-07 NOTE — Telephone Encounter (Signed)
#   vials:1 Ordered date:07/07/14 Shipping Date:07/09/14

## 2014-07-09 NOTE — Telephone Encounter (Signed)
#   Vials:1 Arrival Date:07/08/14 Lot #:[id number] Exp Date:12/19

## 2014-07-12 ENCOUNTER — Telehealth: Payer: Self-pay

## 2014-07-12 DIAGNOSIS — G4700 Insomnia, unspecified: Secondary | ICD-10-CM

## 2014-07-12 DIAGNOSIS — R110 Nausea: Secondary | ICD-10-CM

## 2014-07-12 MED ORDER — MIRTAZAPINE 30 MG PO TABS
30.0000 mg | ORAL_TABLET | Freq: Every day | ORAL | Status: DC
Start: 2014-07-12 — End: 2014-08-19

## 2014-07-12 NOTE — Telephone Encounter (Signed)
Received fax requesting refill on generic remeron 15mg  , will route to Dr Carlean Purl to advise.

## 2014-07-12 NOTE — Telephone Encounter (Signed)
Refill 6

## 2014-07-20 ENCOUNTER — Telehealth: Payer: Self-pay | Admitting: Internal Medicine

## 2014-07-20 MED ORDER — RIFAXIMIN 550 MG PO TABS: 550.0000 mg | ORAL_TABLET | Freq: Three times a day (TID) | ORAL | Status: DC

## 2014-07-20 NOTE — Telephone Encounter (Signed)
Patient reports that she has tried the FODMAP diet and has spent "hundreds of dollars" purchasing the foods and is not any better at all.  She reports that she has terrible bloating, going to the bathroom 3-4 times a day.  "I can't leave the house I am miserable".  Dr. Carlean Purl please advise the next step

## 2014-07-20 NOTE — Telephone Encounter (Signed)
Patient notified of recommendations except lotronex Patient was already terribly worried about potential side effects of xifaxan I did not discuss the possibility of lotronex with her.  Dr. Carlean Purl notified that I did not discuss with her due to how upset she was over her current symptoms and possible side effects.  She is notified that she will be contacted by [facility] or myself once they hear back from her insurance company.

## 2014-07-20 NOTE — Telephone Encounter (Signed)
OK let's try the [employer] route  She has limited funds so may still be too expensive but can see.  If she does not have a f/u please add one next available I think  She is on so many medications and supplements and some of those could be playing a role.  I would also send her Lotronex info of refer her to the website as that may be needed for her if these next steps do not work.

## 2014-07-20 NOTE — Telephone Encounter (Signed)
Please see if we have enough Xifaxan samples to treat her 550 mg tid x 14 d

## 2014-07-20 NOTE — Telephone Encounter (Signed)
We have about 10 and the rep does not have any more for a while.  We can send it through [facility] and see what happens.  They are very limited in the samples they are providing for xifaxan and are not giving [place] more than about 4 boxes a month.

## 2014-07-23 ENCOUNTER — Telehealth: Payer: Self-pay | Admitting: Internal Medicine

## 2014-07-23 NOTE — Telephone Encounter (Signed)
Metronidazole 250 mg tid x 14 days

## 2014-07-23 NOTE — Telephone Encounter (Signed)
Dr., Carlean Purl please advise alternative to xifaxan.

## 2014-07-26 MED ORDER — METRONIDAZOLE 250 MG PO TABS: 250.0000 mg | ORAL_TABLET | Freq: Three times a day (TID) | ORAL | Status: DC

## 2014-07-26 NOTE — Telephone Encounter (Signed)
Patient notified of recommendations Sent rx to her pharmacy

## 2014-07-29 ENCOUNTER — Telehealth: Payer: Self-pay | Admitting: Internal Medicine

## 2014-07-29 NOTE — Telephone Encounter (Signed)
Patient is asking about billing question she discussed with you.  Did you get a chance to look into this? She states that she has sores in her mouth that she takes nystatin for.  Now they are worse since starting on the metronidazole.   I advised her to continue on the metronidazole since this is a problem that she has had prior to staring the medication She feels that the antibiotics are killing off too much and letting the yeast grow in her mouth She is also concerned that she is having large BMs since starting on metronidazole.   I questioned if she was having diarrhea and she denies. She is aware that you are out of town until next week.  Please advise also about billing question.  I sent a staff message to Alisia Ferrari to make sure that the billing and coding was correct and have CCd you on it

## 2014-07-30 ENCOUNTER — Telehealth: Payer: Self-pay

## 2014-07-30 NOTE — Telephone Encounter (Signed)
Patient is upset about her bill from her EGD.  She states that the problem with the billing is that we are billing it as an "[facility]" and this has gone to her deductible.   I explained to the patient that we are a freestanding outpatient surgery center and reviewed with her the section of her paperwork that she still had at home.  I reviewed under the section "Your financial responsibility" where is indicates it is the patients responsibility to contact their insurance and inform them that the procedure will be performed at a ambulatory surgery center.  She says her insurance quoted her $100, I questioned if she informed them of the ambulatory surgery center.  She states that she did not see that part.  She has filed an appeal with her insurance.  I told her I hope that the appeal goes her way.

## 2014-07-30 NOTE — Telephone Encounter (Signed)
I will call her next week about all of this Thanks

## 2014-07-30 NOTE — Telephone Encounter (Signed)
-----   Message from Gatha Mayer, MD sent at 07/30/2014 10:50 AM EDT ----- We have done this and she had a deductible for the facility fee   PJ had explained that to her for me ----- Message -----    From: Marlon Pel, RN    Sent: 07/29/2014  11:51 AM      To: Jeris Penta, MD  Will you please look at the billing and coding of her procedure in the [facility] from March.  Patient is questioning the coding.  She says she was charged incorrectly.     Thanks    [employer]

## 2014-08-03 ENCOUNTER — Ambulatory Visit (INDEPENDENT_AMBULATORY_CARE_PROVIDER_SITE_OTHER): Payer: Medicare Other | Admitting: Critical Care Medicine

## 2014-08-03 ENCOUNTER — Encounter: Payer: Self-pay | Admitting: Critical Care Medicine

## 2014-08-03 ENCOUNTER — Ambulatory Visit: Payer: Medicare Other

## 2014-08-03 VITALS — BP 138/64 | HR 83 | Temp 98.3°F | Ht 61.25 in | Wt 114.4 lb

## 2014-08-03 DIAGNOSIS — J4540 Moderate persistent asthma, uncomplicated: Secondary | ICD-10-CM

## 2014-08-03 DIAGNOSIS — J4551 Severe persistent asthma with (acute) exacerbation: Secondary | ICD-10-CM | POA: Diagnosis not present

## 2014-08-03 MED ORDER — MOMETASONE FURO-FORMOTEROL FUM 100-5 MCG/ACT IN AERO: 2.0000 | INHALATION_SPRAY | Freq: Two times a day (BID) | RESPIRATORY_TRACT | Status: DC

## 2014-08-03 NOTE — Progress Notes (Signed)
Subjective:    Patient ID: Ebony Scott, female    DOB: [birth year], 64 y.o.   MRN: [id number]  HPI 08/03/2014 Chief Complaint  Patient presents with  . 3 month follow up    increased SOB with any exertion x 2 wks, chest tightness with deep inhalations, cough with yellow mucus, sinus pressure, and HA.   Noting more dyspnea for 2 weeks with poor air quality.  Mold evaluation:  Only allergy.  PFR down from 320 to 250  Pt denies any significant sore throat, nasal congestion or excess secretions, fever, chills, sweats, unintended weight loss, pleurtic or exertional chest pain, orthopnea PND, or leg swelling Pt denies any increase in rescue therapy over baseline, denies waking up needing it or having any early am or nocturnal exacerbations of coughing/wheezing/or dyspnea. Pt also denies any obvious fluctuation in symptoms with  weather or environmental change or other alleviating or aggravating factors   Current Medications, Allergies, Complete Past Medical History, Past Surgical History, Family History, and Social History were reviewed in [place] record per todays encounter:  08/03/2014   Review of Systems  Constitutional: Positive for fatigue.  HENT: Positive for postnasal drip, rhinorrhea and voice change. Negative for ear pain, sinus pressure, sore throat and trouble swallowing.   Eyes: Negative.   Respiratory: Positive for cough, choking, chest tightness, shortness of breath and wheezing. Negative for apnea and stridor.   Cardiovascular: Negative.  Negative for chest pain, palpitations and leg swelling.  Gastrointestinal: Negative.  Negative for nausea, vomiting, abdominal pain and abdominal distention.  Genitourinary: Negative.   Musculoskeletal: Negative.  Negative for myalgias and arthralgias.  Skin: Negative.  Negative for rash.  Allergic/Immunologic: Negative.  Negative for environmental allergies and food allergies.  Neurological: Negative.  Negative for  dizziness, syncope, weakness and headaches.  Hematological: Negative.  Negative for adenopathy. Does not bruise/bleed easily.  Psychiatric/Behavioral: Negative.  Negative for sleep disturbance and agitation. The patient is not nervous/anxious.        Objective:   Physical Exam Filed Vitals:   08/03/14 1159  BP: 138/64  Pulse: 83  Temp: 98.3 F (36.8 C)  TempSrc: Oral  Height: 5' 1.25" (1.556 m)  Weight: 114 lb 6.4 oz (51.891 kg)  SpO2: 97%    Gen: Pleasant, well-nourished, in no distress,  normal affect  ENT: No lesions,  mouth clear,  oropharynx clear, no postnasal drip  Neck: No JVD, no TMG, no carotid bruits  Lungs: No use of accessory muscles, no dullness to percussion,exp wheezes  Cardiovascular: RRR, heart sounds normal, no murmur or gallops, no peripheral edema  Abdomen: soft and NT, no HSM,  BS normal  Musculoskeletal: No deformities, no cyanosis or clubbing  Neuro: alert, non focal  Skin: Warm, no lesions or rashes  No results found.        Assessment & Plan:  I personally reviewed all images and lab data in the [facility] system as well as any outside material available during this office visit and agree with the  radiology impressions.   Severe persistent asthma with significant atopic features Severe persistent asthma with flare d/t environmental triggers with high ozone levels and mold exposure  Note very unstable airway Plan Increase dulera 100 two puff bid with spacer  No systemic steroids needed    Nichele was seen today for 3 month follow up.  Diagnoses and all orders for this visit:  Asthma with allergic rhinitis, severe persistent, with acute exacerbation  Other orders -  mometasone-formoterol (DULERA) 100-5 MCG/ACT AERO; Inhale 2 puffs into the lungs 2 (two) times daily.    I had an extended discussion with the patient and or family lasting 10 minutes of a 25 minute visit including:  tx options, need to increase inhaled meds, proper use  of pfm and associated need for monitoring : yellow zone 250  Green zone 320+

## 2014-08-03 NOTE — Patient Instructions (Signed)
Increase dulera two puff twice daily, use new spacer No other medication changes Return 2 months

## 2014-08-04 ENCOUNTER — Telehealth: Payer: Self-pay | Admitting: *Deleted

## 2014-08-04 ENCOUNTER — Encounter: Payer: Self-pay | Admitting: *Deleted

## 2014-08-04 MED ORDER — OMALIZUMAB 150 MG ~~LOC~~ SOLR
150.0000 mg | Freq: Once | SUBCUTANEOUS | Status: AC
  Administered 2014-08-03: 150 mg via SUBCUTANEOUS

## 2014-08-04 NOTE — Telephone Encounter (Signed)
Patient called to report that lidocaine ointment does not offer relief for her neck pain. She says it lasts for approx 20 minutes after each application. :Pt was denied the lidoderm patch.  She is personally filing an appeal. She has asked her PCP to file an appeal. She is asking if we would write a letter of appeal too.

## 2014-08-04 NOTE — Addendum Note (Signed)
Addended by: Len Blalock on: 08/04/2014 12:13 PM   Modules accepted: Orders

## 2014-08-04 NOTE — Assessment & Plan Note (Addendum)
Severe persistent asthma with flare d/t environmental triggers with high ozone levels and mold exposure  Note very unstable airway Plan Increase dulera 100 two puff bid with spacer  No systemic steroids needed

## 2014-08-05 ENCOUNTER — Ambulatory Visit (INDEPENDENT_AMBULATORY_CARE_PROVIDER_SITE_OTHER): Payer: Medicare Other | Admitting: *Deleted

## 2014-08-05 ENCOUNTER — Telehealth: Payer: Self-pay | Admitting: Internal Medicine

## 2014-08-05 DIAGNOSIS — M810 Age-related osteoporosis without current pathological fracture: Secondary | ICD-10-CM

## 2014-08-05 MED ORDER — DENOSUMAB 60 MG/ML ~~LOC~~ SOLN
60.0000 mg | Freq: Once | SUBCUTANEOUS | Status: AC
  Administered 2014-08-05: 60 mg via SUBCUTANEOUS

## 2014-08-05 NOTE — Patient Instructions (Signed)
Return in 6 months for next injection.  

## 2014-08-05 NOTE — Telephone Encounter (Signed)
Appealing denial for Lidoderm patches for patient.

## 2014-08-05 NOTE — Telephone Encounter (Signed)
My Chart message to patient Would not take supplement

## 2014-08-06 ENCOUNTER — Telehealth: Payer: Self-pay | Admitting: Critical Care Medicine

## 2014-08-06 NOTE — Telephone Encounter (Signed)
Clarification for pt on Dulera to do 2 puffs BID and that it can take a couple of weeks to see a difference. Also informed pt that if she is having SOB she can use her Proair as needed but for her to call us if needed or if she feels Ruthe Mannan is not helping her. Nothing further needed at this time.

## 2014-08-09 ENCOUNTER — Telehealth: Payer: Self-pay | Admitting: Family Medicine

## 2014-08-09 ENCOUNTER — Telehealth: Payer: Self-pay | Admitting: Internal Medicine

## 2014-08-09 ENCOUNTER — Other Ambulatory Visit: Payer: Self-pay | Admitting: Family Medicine

## 2014-08-09 MED ORDER — HYDROCORTISONE 2.5 % RE CREA: TOPICAL_CREAM | RECTAL | Status: DC

## 2014-08-09 MED ORDER — COLCHICINE 0.6 MG PO TABS: 0.6000 mg | ORAL_TABLET | ORAL | Status: DC | PRN

## 2014-08-09 NOTE — Telephone Encounter (Signed)
Sent me a letter - dissatisfied that I recommended counseling as part of her treatment.  Thinks I am telling her its "all in her head"  I am in no way suggesting that but think some of her sxs could be related to stressors.

## 2014-08-09 NOTE — Telephone Encounter (Signed)
Medication called/sent to requested pharmacy  

## 2014-08-09 NOTE — Telephone Encounter (Signed)
Patient would like dr pickard to call in rx for proctozon if possible  In the ointment if possible  [facility]  [phone]

## 2014-08-09 NOTE — Telephone Encounter (Signed)
Medication refilled per protocol. 

## 2014-08-09 NOTE — Telephone Encounter (Signed)
#   vials:1 Ordered date:08/06/14 Shipping Date:08/09/14

## 2014-08-10 NOTE — Telephone Encounter (Signed)
#   Vials:1 Arrival Date:08/10/14 Lot #:[id number] Exp Date:1/20

## 2014-08-11 ENCOUNTER — Other Ambulatory Visit: Payer: Self-pay | Admitting: Family Medicine

## 2014-08-11 IMAGING — CR DG CHEST 2V
2 series · 2 of 2 positions shown · non-contrast
Comparison: 11/28/2011

CLINICAL DATA: Cough, chest heaviness, dizziness, asthma, question
pneumonia

CHEST - 2 VIEW

[view not recorded (1 of 2)]
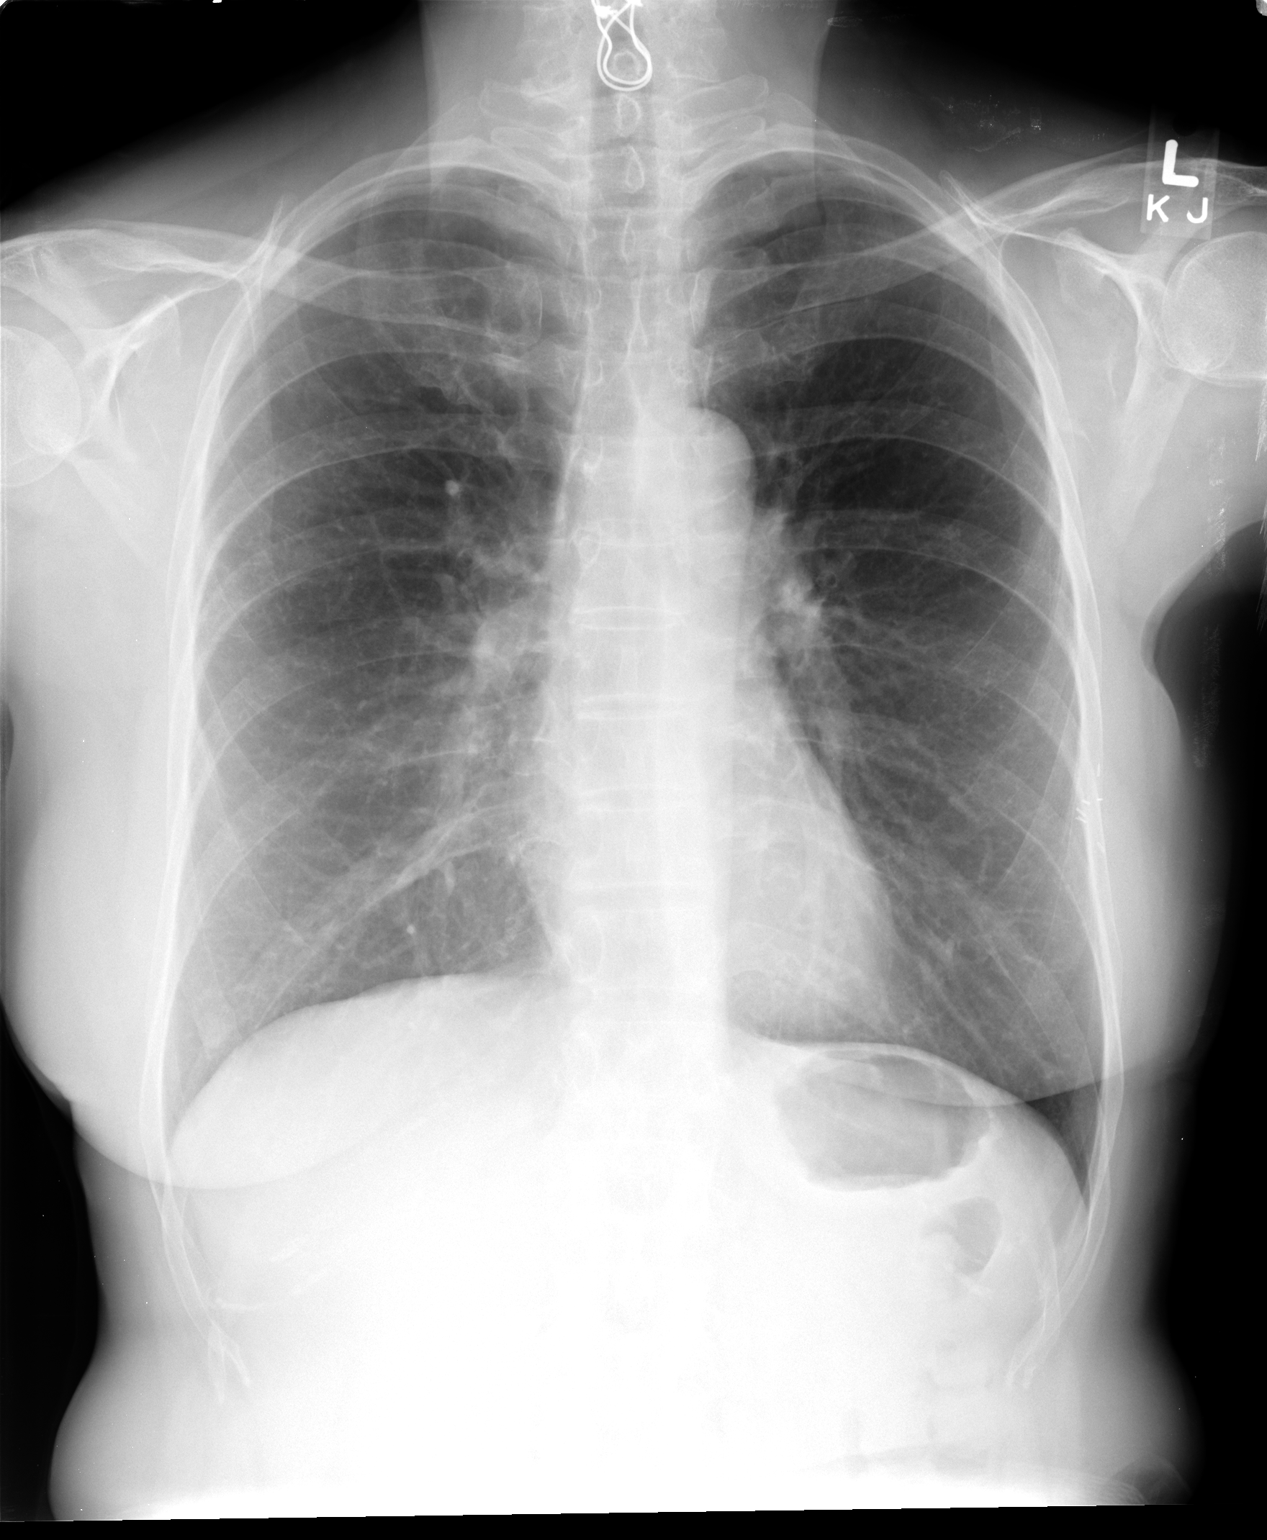

[view not recorded (2 of 2)]
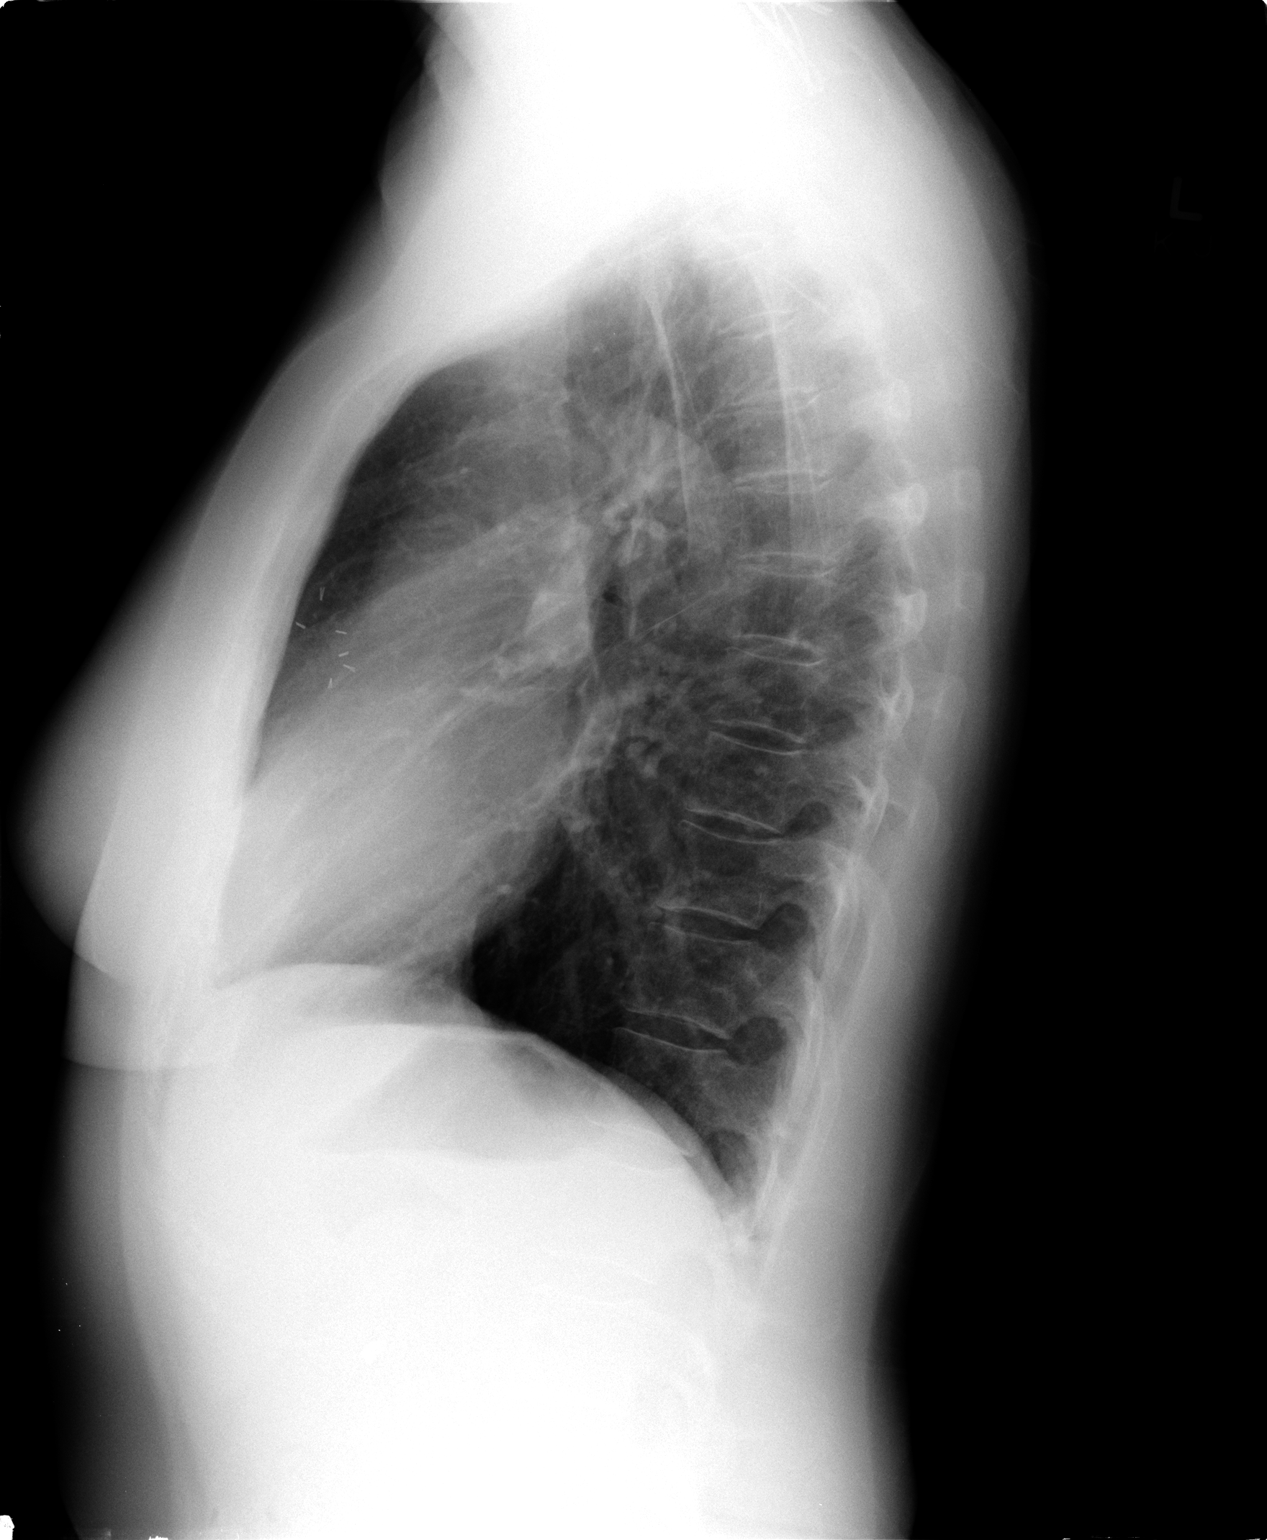

[2 of 2 positions shown; findings below may reference images not displayed]

FINDINGS: Normal heart size, mediastinal contours, and pulmonary vascularity.
Atherosclerotic calcification aorta.
Lungs appear slightly hyperinflated with minimal peribronchial
thickening likely related to patient's history of asthma.
No pulmonary infiltrate, pleural effusion or pneumothorax.
Surgical clips left breast.
Bones appear demineralized.
Prior cervical spine surgery.
IMPRESSION: No acute abnormalities.

## 2014-08-11 MED ORDER — PSEUDOEPHEDRINE-GUAIFENESIN ER 60-600 MG PO TB12: 1.0000 | ORAL_TABLET | Freq: Two times a day (BID) | ORAL | Status: DC

## 2014-08-11 NOTE — Telephone Encounter (Signed)
Medication refilled per protocol. 

## 2014-08-12 ENCOUNTER — Other Ambulatory Visit: Payer: Self-pay | Admitting: Family Medicine

## 2014-08-12 MED ORDER — LIDOCAINE 5 % EX OINT
1.0000 | TOPICAL_OINTMENT | CUTANEOUS | Status: DC | PRN
Start: 2014-08-12 — End: 2014-08-31

## 2014-08-12 NOTE — Telephone Encounter (Signed)
Medication refilled per protocol. 

## 2014-08-13 ENCOUNTER — Encounter: Payer: Self-pay | Admitting: Family Medicine

## 2014-08-16 NOTE — Telephone Encounter (Signed)
Ms Vandervoort called because she got her denial letter in the mail.  She says that she has gone to talk with the Birder Robson for this district and he took her information and wanted to know if we wanted his name to talk with him.  I told her no that the appeals have been exhausted on our part and if she does not meet the criteria for the medication there is nothing more we can do, and if he can help her that will be great but we would not be in contact with him.  I suggested there are patches otc that have lidocaine in them now and she should try and look for them if she would like to try them.

## 2014-08-19 ENCOUNTER — Telehealth: Payer: Self-pay | Admitting: Internal Medicine

## 2014-08-19 MED ORDER — MIRTAZAPINE 30 MG PO TBDP: 30.0000 mg | ORAL_TABLET | Freq: Every day | ORAL | Status: DC

## 2014-08-19 NOTE — Telephone Encounter (Signed)
Patient informed that rx sent in and she said thank you Dr. Carlean Purl.

## 2014-08-19 NOTE — Telephone Encounter (Signed)
Please advise Sir? 

## 2014-08-19 NOTE — Telephone Encounter (Signed)
Yes - if that is an option or she could do 2 15 mg dissolving

## 2014-08-27 ENCOUNTER — Telehealth: Payer: Self-pay | Admitting: Family Medicine

## 2014-08-27 MED ORDER — LIDOCAINE 5 % EX PTCH: 1.0000 | MEDICATED_PATCH | CUTANEOUS | Status: DC

## 2014-08-27 NOTE — Telephone Encounter (Signed)
Pt wants [place] to try to order her Lidoderm patches again.  Will need to do PA.  Has been working with her Birder Robson to get approved.  Needs denial letter to have them continue to assist her.  Will reorder patches and proceed with process at patient request.

## 2014-08-27 NOTE — Telephone Encounter (Signed)
ok 

## 2014-08-31 ENCOUNTER — Encounter: Payer: Self-pay | Admitting: Internal Medicine

## 2014-08-31 ENCOUNTER — Ambulatory Visit (INDEPENDENT_AMBULATORY_CARE_PROVIDER_SITE_OTHER): Payer: Medicare Other | Admitting: Internal Medicine

## 2014-08-31 ENCOUNTER — Ambulatory Visit (INDEPENDENT_AMBULATORY_CARE_PROVIDER_SITE_OTHER): Payer: Medicare Other

## 2014-08-31 VITALS — BP 140/76 | HR 68 | Ht 61.25 in | Wt 118.8 lb

## 2014-08-31 DIAGNOSIS — R110 Nausea: Secondary | ICD-10-CM

## 2014-08-31 DIAGNOSIS — J4520 Mild intermittent asthma, uncomplicated: Secondary | ICD-10-CM

## 2014-08-31 DIAGNOSIS — K589 Irritable bowel syndrome without diarrhea: Secondary | ICD-10-CM

## 2014-08-31 MED ORDER — OMALIZUMAB 150 MG ~~LOC~~ SOLR
150.0000 mg | Freq: Once | SUBCUTANEOUS | Status: AC
  Administered 2014-08-31: 150 mg via SUBCUTANEOUS

## 2014-08-31 NOTE — Progress Notes (Signed)
   Subjective:    Patient ID: Ebony Scott, female    DOB: 1951-02-02, 64 y.o.   MRN: [id number] Cc: diarrhea HPI Better off FODMAP and after metronidazole Rx. Could not afford Xifaxan $200 copay Wt Readings from Last 3 Encounters:  08/31/14 118 lb 12.8 oz (53.887 kg)  08/03/14 114 lb 6.4 oz (51.891 kg)  06/23/14 110 lb 9.6 oz (50.168 kg)   Hemorrhoid issues with diarrhea - Tx w/ ointment Has to help defecation - hx hysterectomy and A and P repair Gets hot and fainty rare at times with defaction  BM's formed and less frequent 1x day  Denies sig new stressors A little down from breathing problems "very labored x 1 month"  Brought extensive food diaries - lists gas and burps and stools  Gets some epigastric pressure with eating Medications, allergies, past medical history, past surgical history, family history and social history are reviewed and updated in the EMR.  Review of Systems As above Mouth sores with inhaler rinses w/ nystatin    Objective:   Physical Exam @BP  140/76 mmHg  Pulse 68  Ht 5' 1.25" (1.556 m)  Wt 118 lb 12.8 oz (53.887 kg)  BMI 22.26 kg/m2@  General:  NAD Eyes:   anicteric Lungs:  clear Heart:: S1S2 no rubs, murmurs or gallops Abdomen:  soft and nontender, BS+ Ext:   no edema, cyanosis or clubbing    Data Reviewed:  Pulmonary notes     Assessment & Plan:  IRRITABLE BOWEL SYNDROME better overall, question from metronidazole or changing off the FODMAPS diet. At any rate she is significantly improved. We'll return in 3 months.  Nausea without vomiting Much better Continue mirtazipine

## 2014-08-31 NOTE — Patient Instructions (Addendum)
   I am glad you are feeling better.   Follow up with Dr .Carlean Purl in 3 months, Recall put in.    I appreciate the opportunity to care for you. Silvano Rusk, MD, [facility]

## 2014-08-31 NOTE — Assessment & Plan Note (Signed)
Much better Continue mirtazipine

## 2014-08-31 NOTE — Assessment & Plan Note (Addendum)
better overall, question from metronidazole or changing off the FODMAPS diet. At any rate she is significantly improved. We'll return in 3 months.

## 2014-08-31 NOTE — Telephone Encounter (Signed)
Unable to submit through Cover My meds.  [employer] form sent to me by [facility] and has been faxed to [employer].  Awaiting response

## 2014-09-01 ENCOUNTER — Encounter: Payer: Self-pay | Admitting: Critical Care Medicine

## 2014-09-01 ENCOUNTER — Encounter: Payer: Self-pay | Admitting: Internal Medicine

## 2014-09-01 NOTE — Telephone Encounter (Signed)
[facility] office has contacted me back.  Faxing denial to his Constituent Liaison, Osa Craver, at [phone].

## 2014-09-01 NOTE — Telephone Encounter (Signed)
Rec'd denial for Lidoderm patches.  I contacted patient.  She gave me name of contact at her Congressman's office to inform.  Will contact them and see what we need to do with denial so they can help her.

## 2014-09-02 NOTE — Telephone Encounter (Signed)
PW please advise. Thanks! 

## 2014-09-03 ENCOUNTER — Encounter: Payer: Self-pay | Admitting: Internal Medicine

## 2014-09-03 DIAGNOSIS — R1310 Dysphagia, unspecified: Secondary | ICD-10-CM | POA: Insufficient documentation

## 2014-09-06 IMAGING — CR DG FOOT COMPLETE 3+V*R*
3 series · 3 of 3 positions shown · non-contrast
Comparison: 08/27/2011

CLINICAL DATA: Pain in the right leg and foot.  History of breast
cancer.  Chills.

RIGHT FOOT COMPLETE - 3+ VIEW

[x foot ap right]
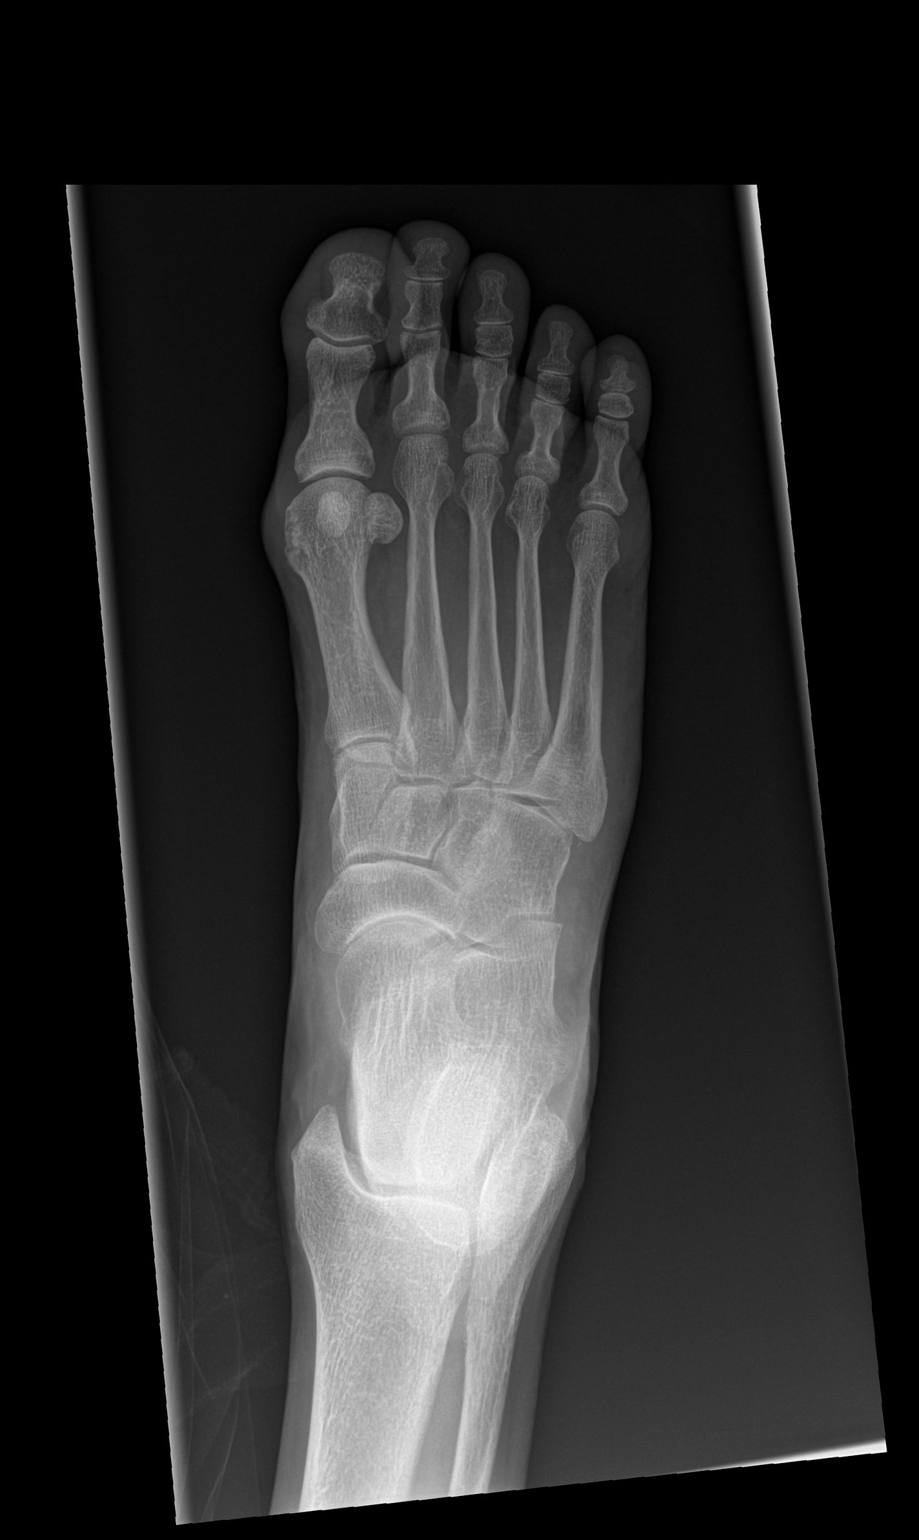

[x foot obl right]
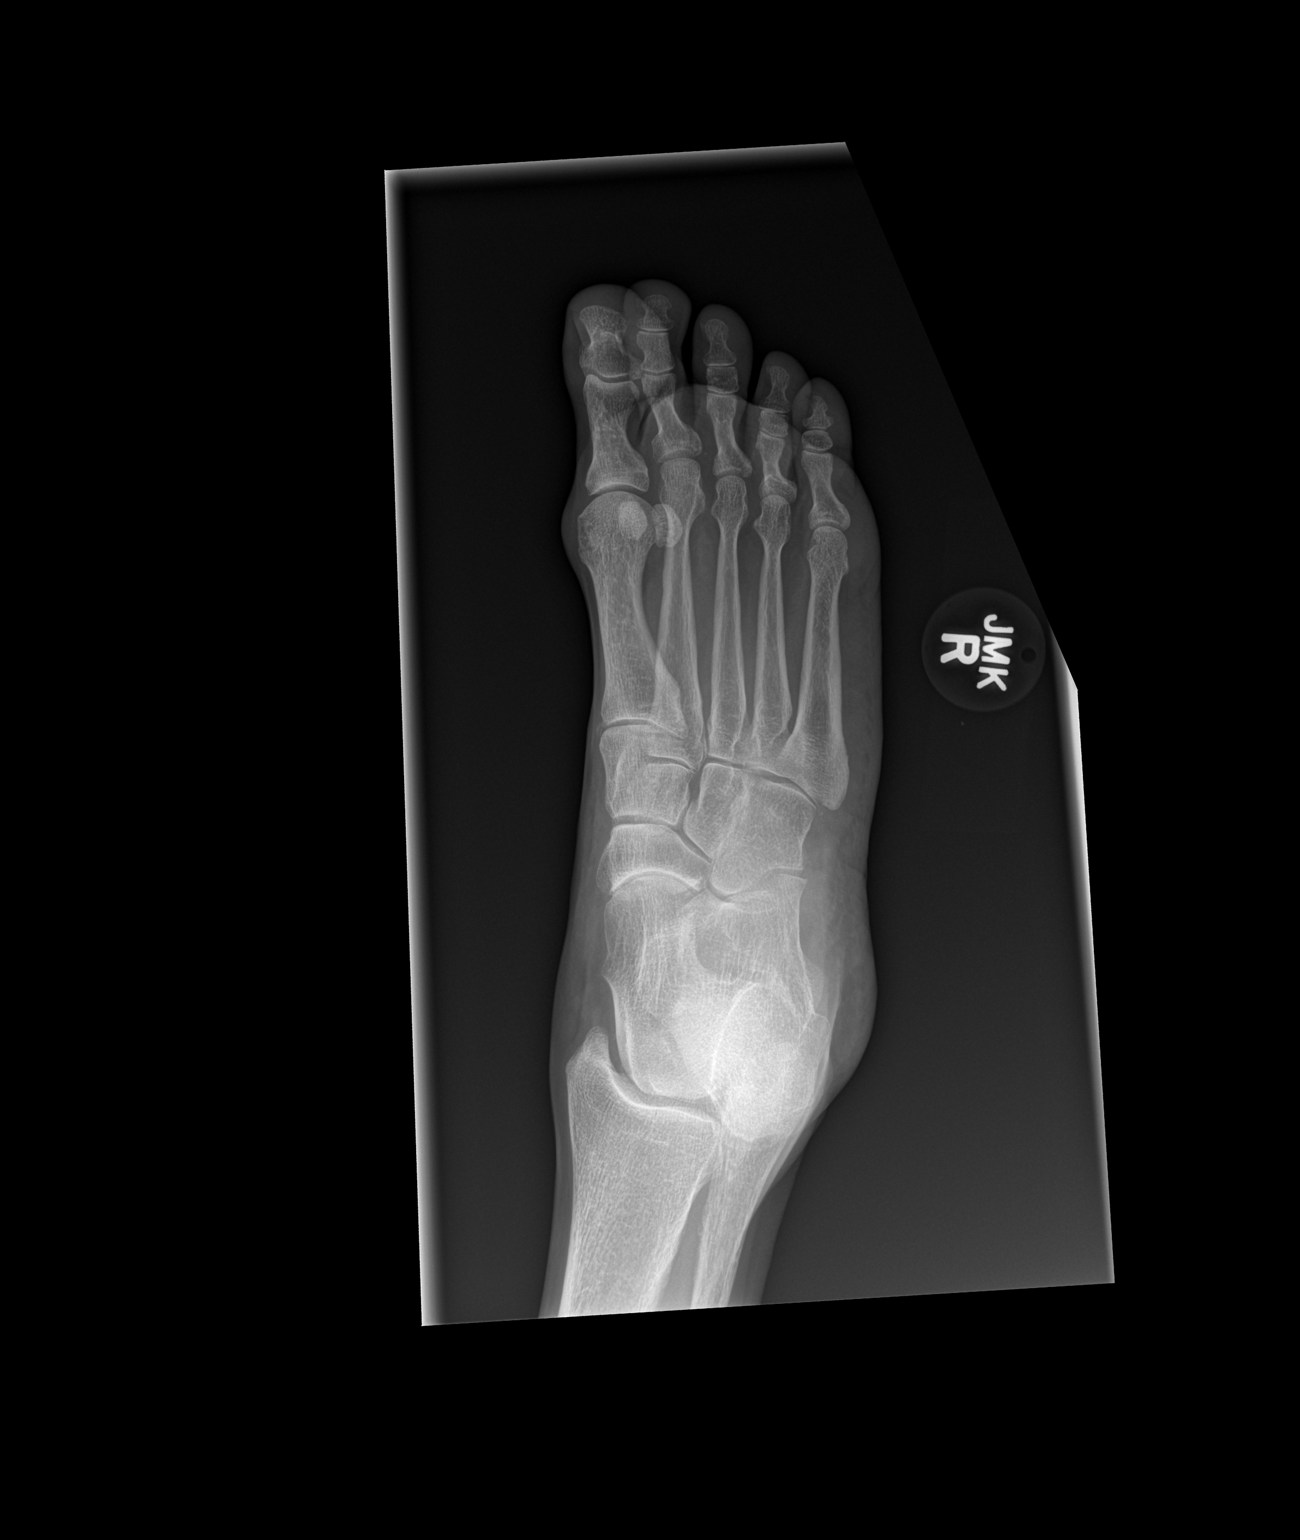

[x foot lat right]
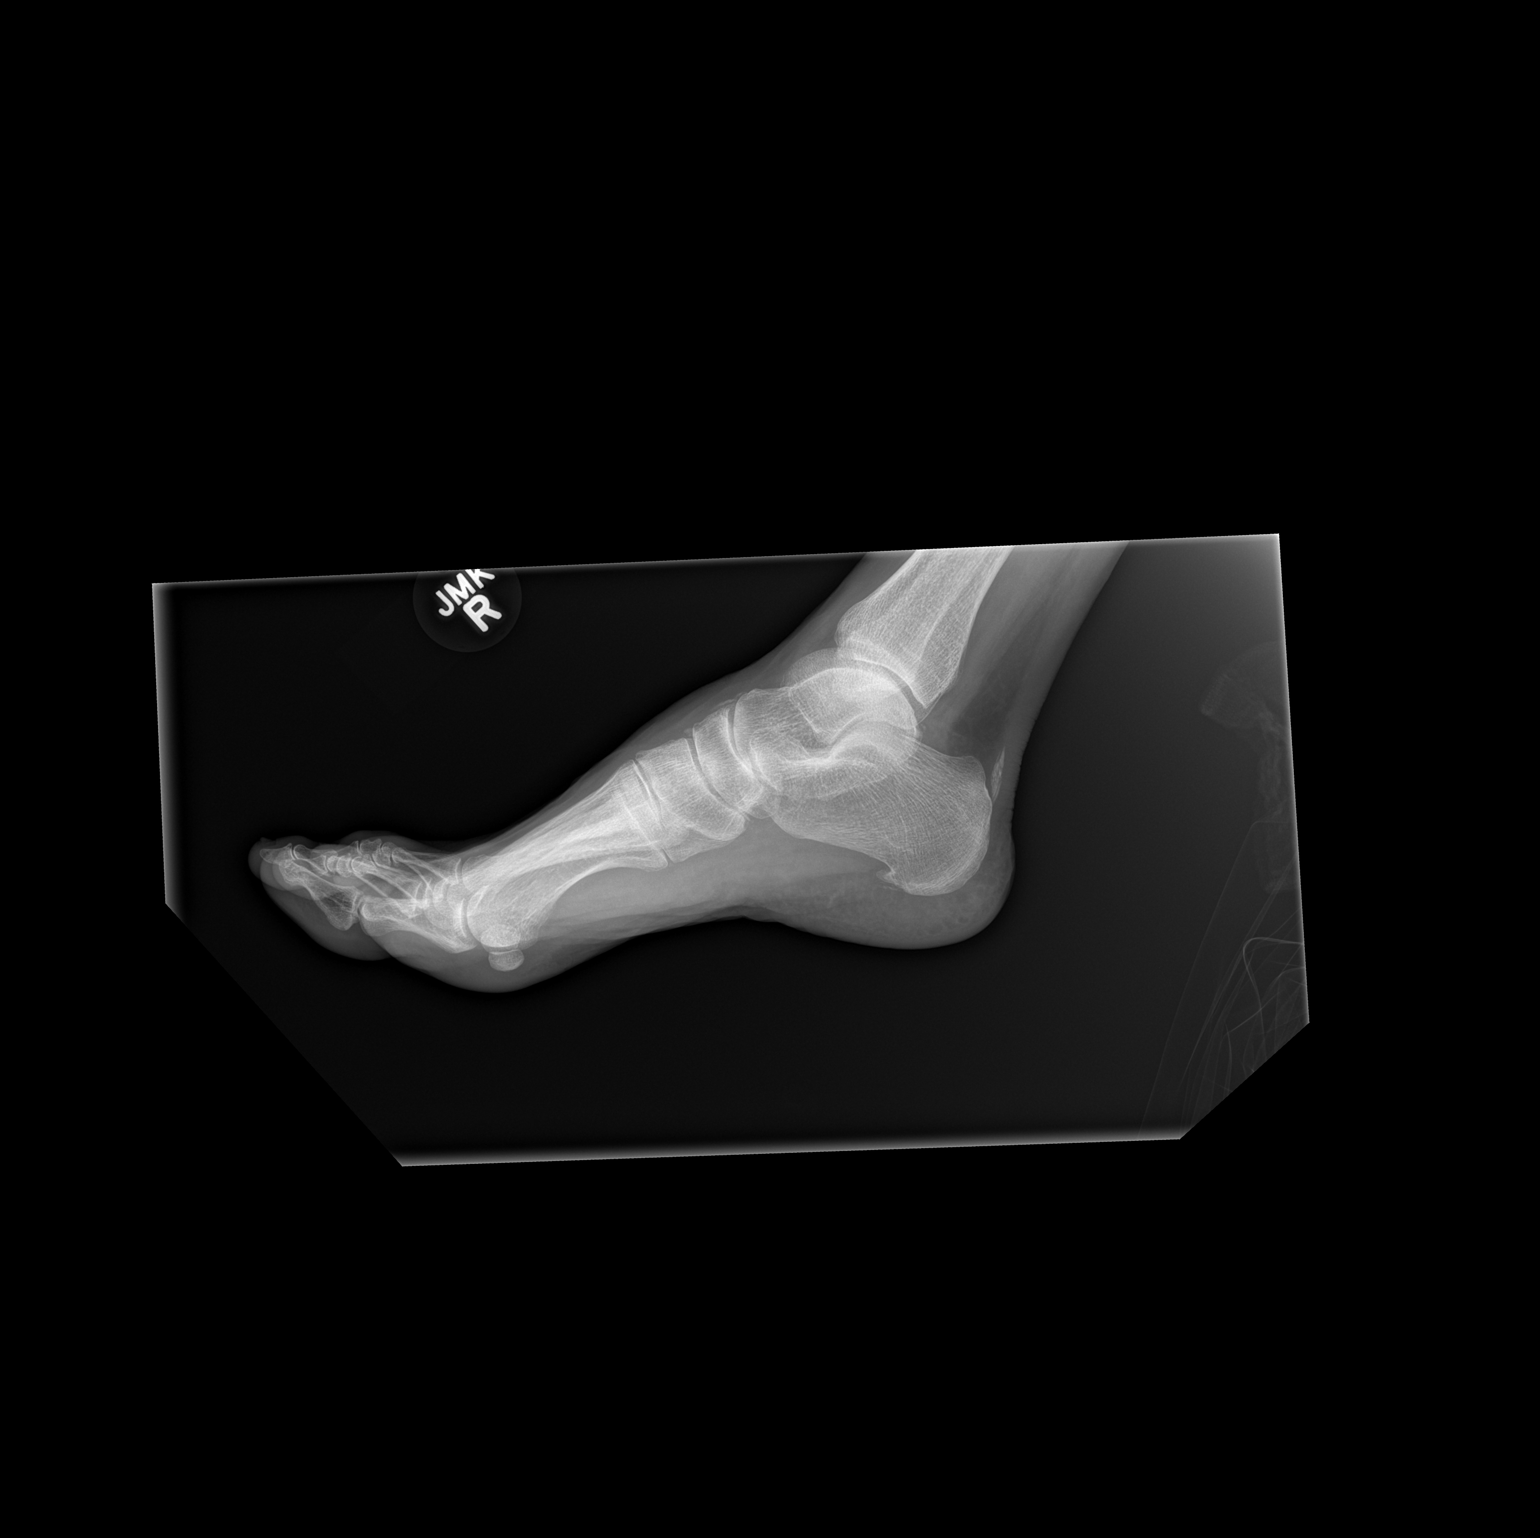

[3 of 3 positions shown; findings below may reference images not displayed]

FINDINGS: There is no evidence for acute fracture or dislocation.
No soft tissue foreign body or gas identified.  There is mild
degenerative change in the hind foot at the first tarsometatarsal
joint.  Small plantar are and Achilles spurs are identified.
IMPRESSION: 1.  Degenerative changes.
2. No evidence for acute  abnormality.

## 2014-09-06 IMAGING — CR DG CHEST 2V
2 series · 2 of 2 positions shown · non-contrast
Comparison: 01/25/2012

CLINICAL DATA: Cough and chills.  Pain down right leg and right
foot.  History of breast cancer.  Symptoms began on [REDACTED] night.

CHEST - 2 VIEW

[w chest lat]
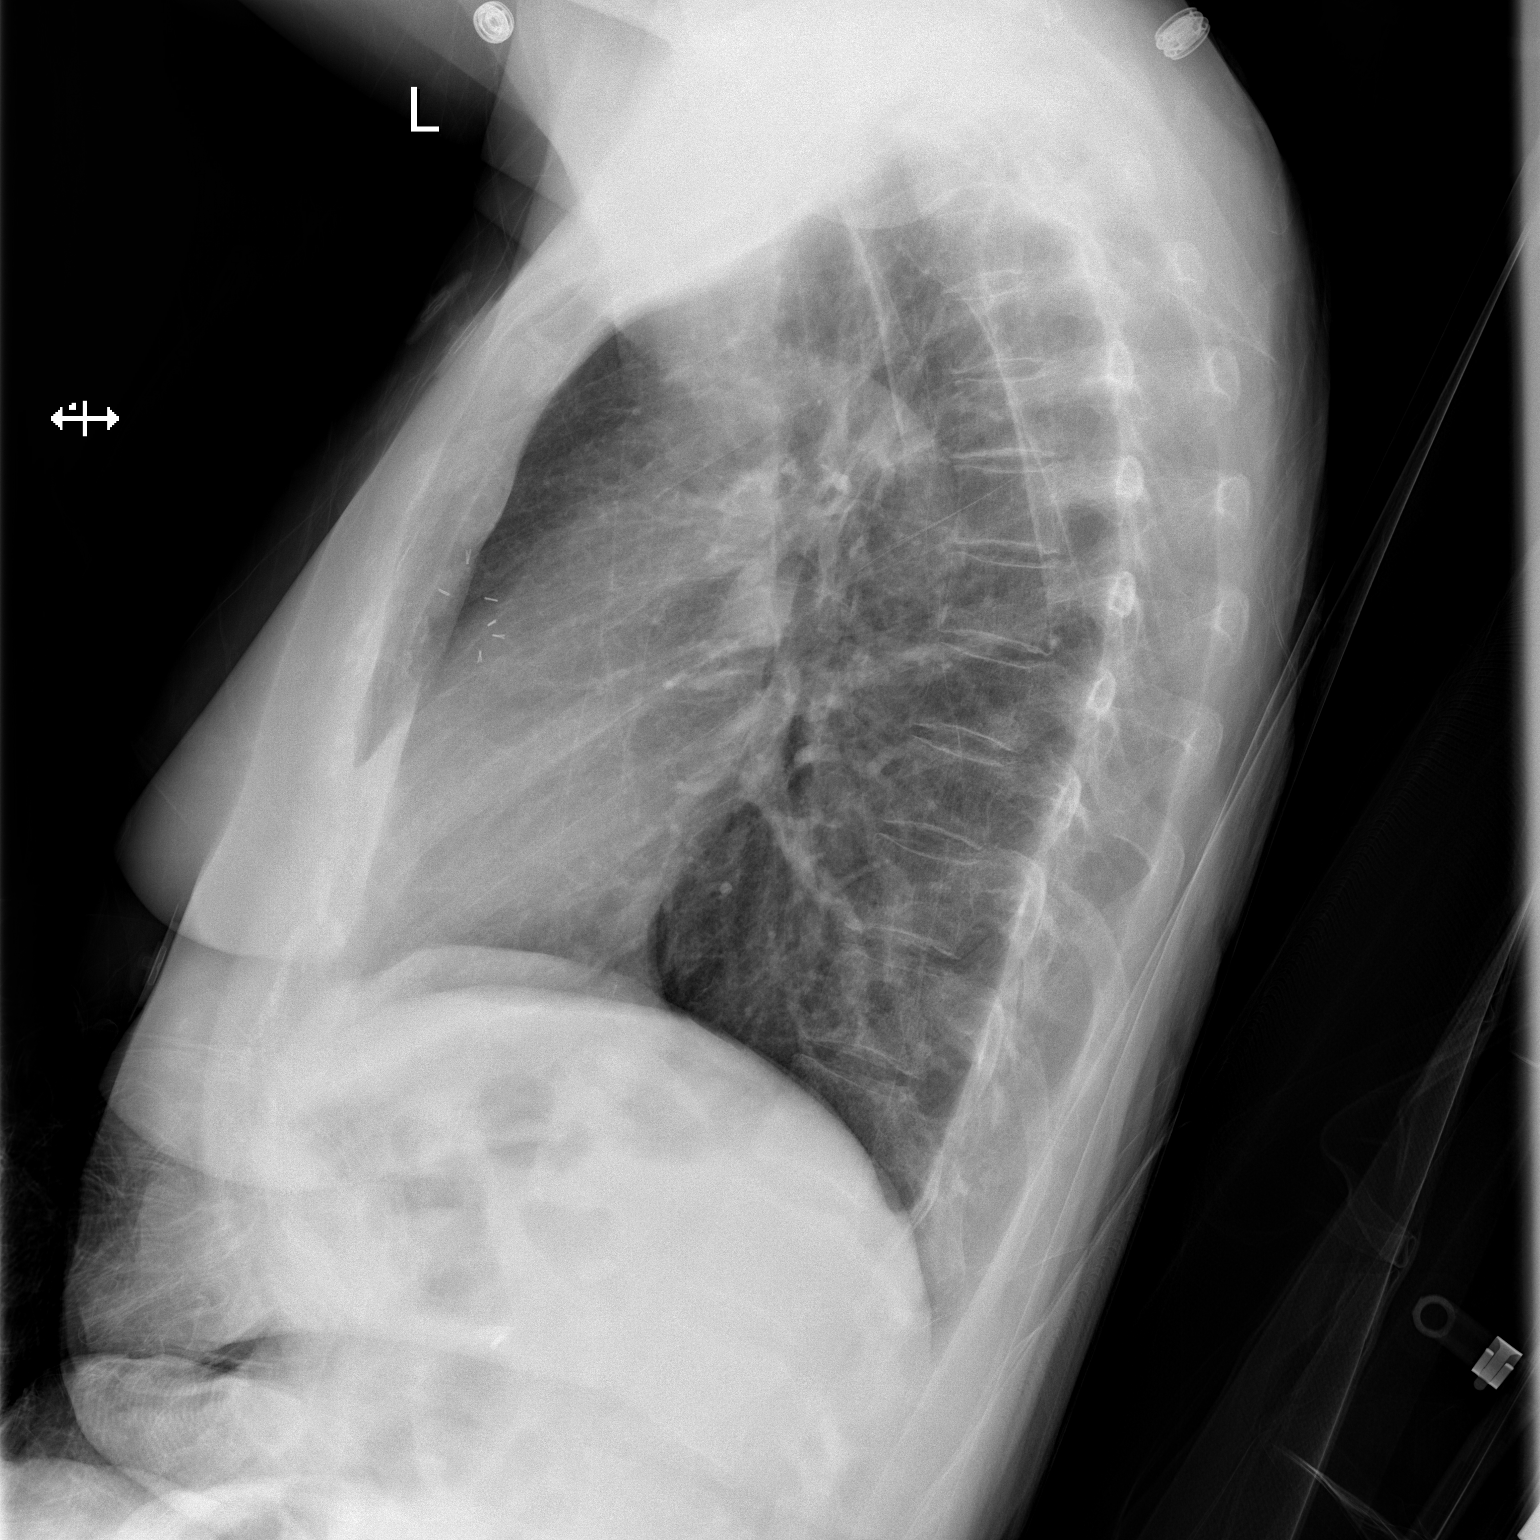

[x chest ap]
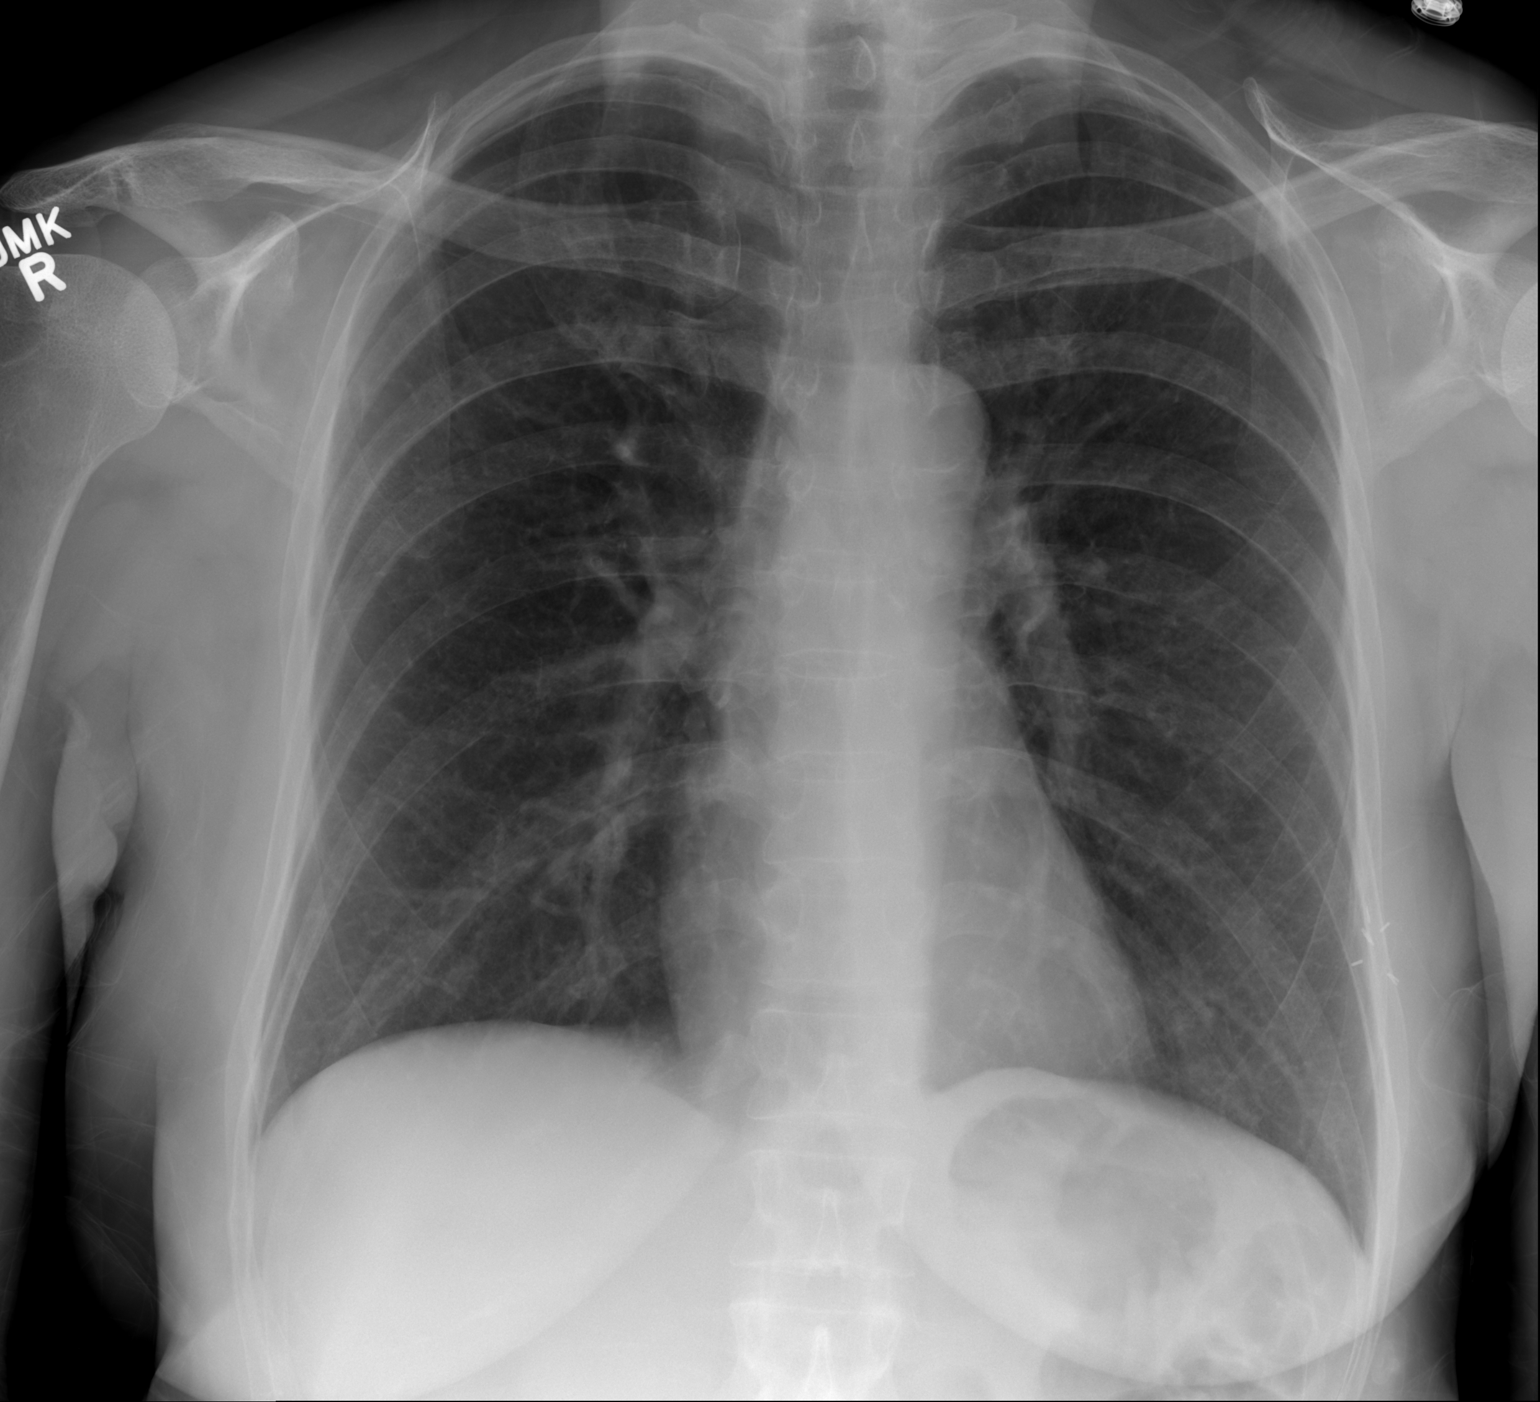

[2 of 2 positions shown; findings below may reference images not displayed]

FINDINGS: Cardiomediastinal silhouette is within normal limits.
The lungs are free of focal consolidations and pleural effusions.
No pulmonary edema.  Surgical clips overlie the left anterior chest
wall/breast.  Surgical clips are identified in the upper abdomen.
Visualized osseous structures have a normal appearance.
IMPRESSION: No evidence for acute cardiopulmonary abnormality.

## 2014-09-07 IMAGING — CR DG KNEE COMPLETE 4+V*R*
4 series · 4 of 4 positions shown · non-contrast
Comparison: None.

CLINICAL DATA: Right knee pain and swelling.

RIGHT KNEE - COMPLETE 4+ VIEW

[x knee ap right (1 of 3)]
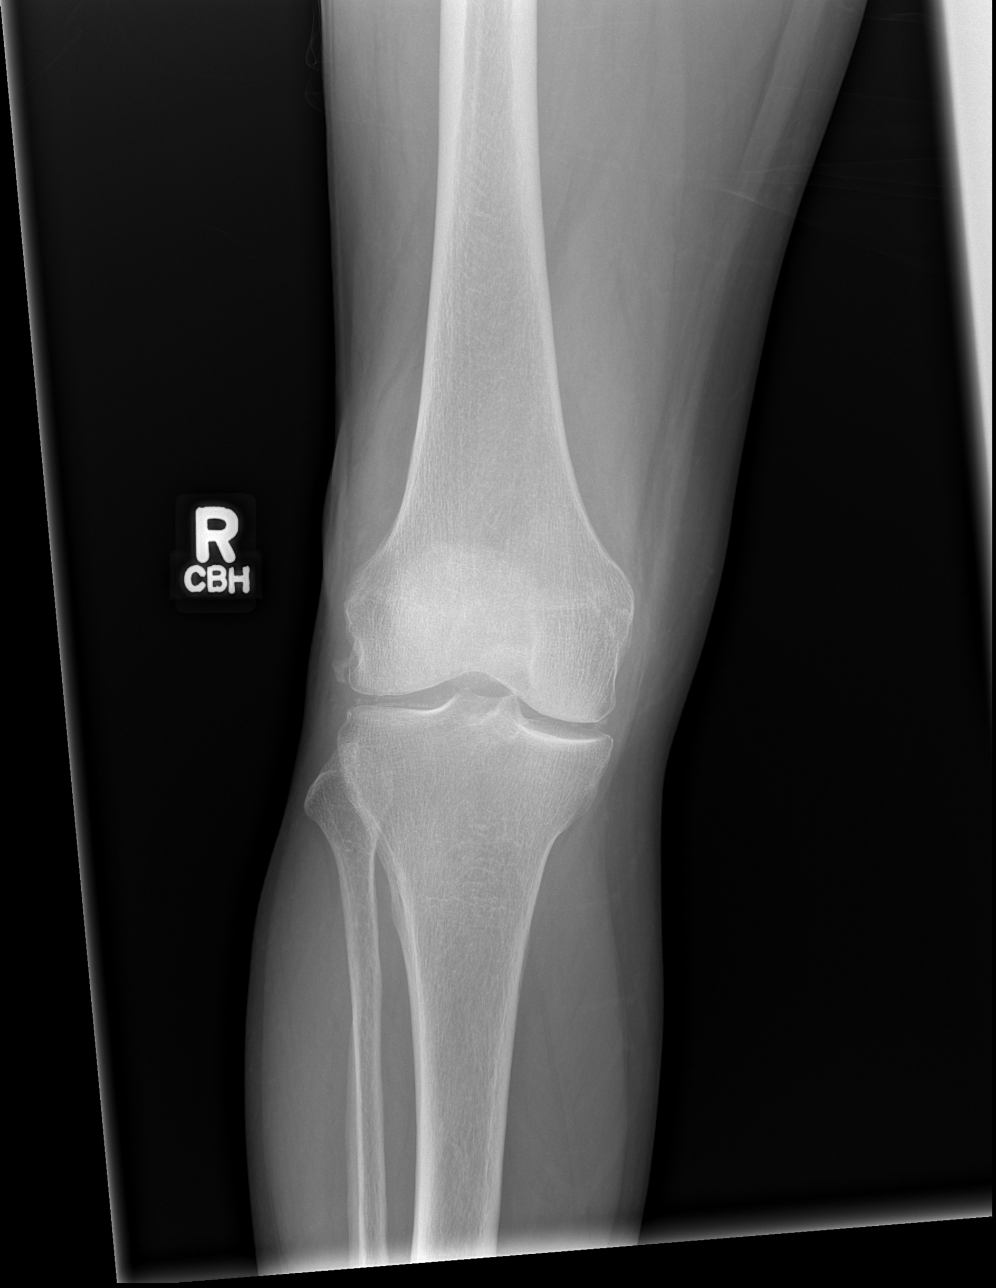

[x knee ap right (2 of 3)]
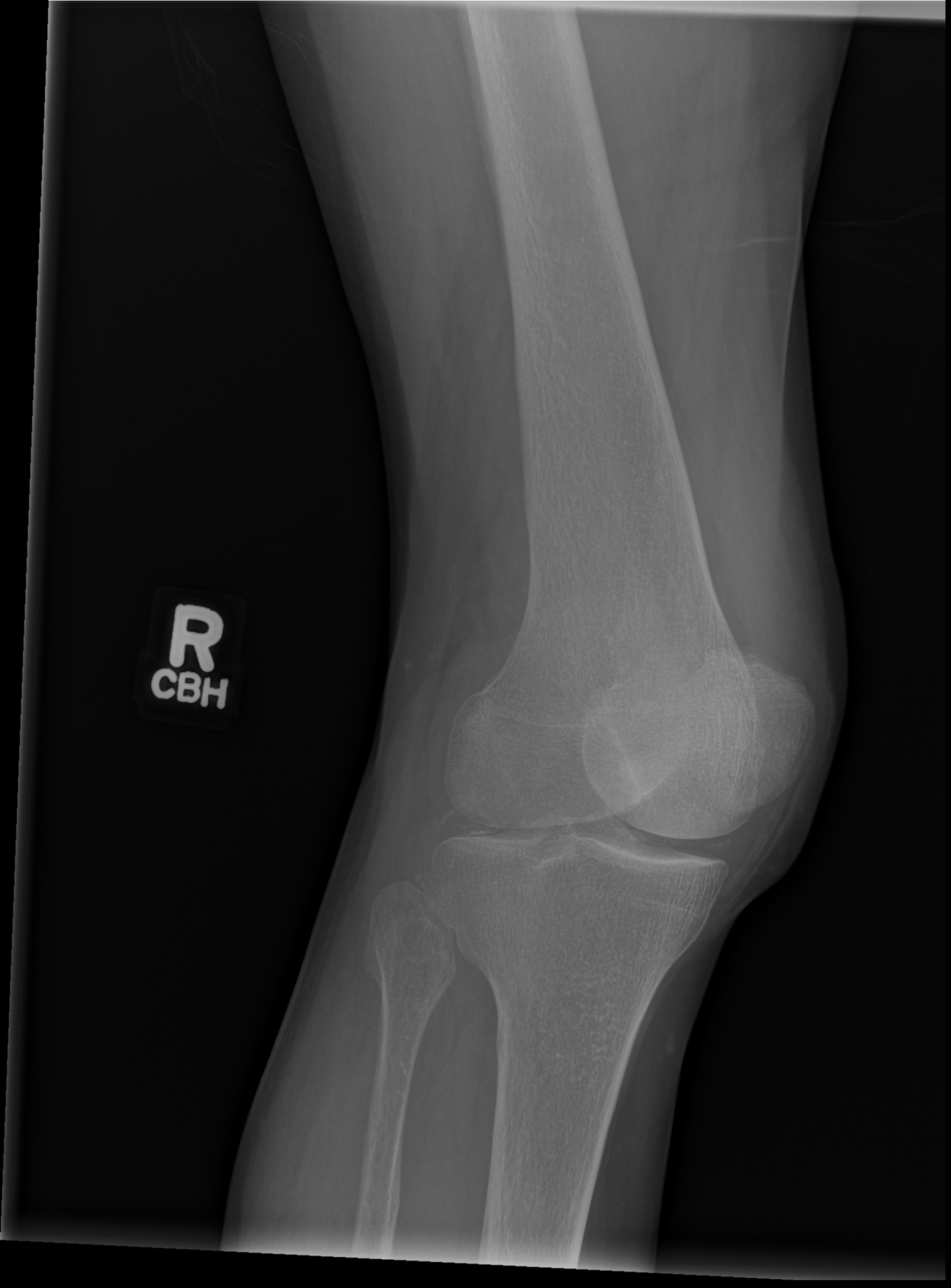

[x knee ap right (3 of 3)]
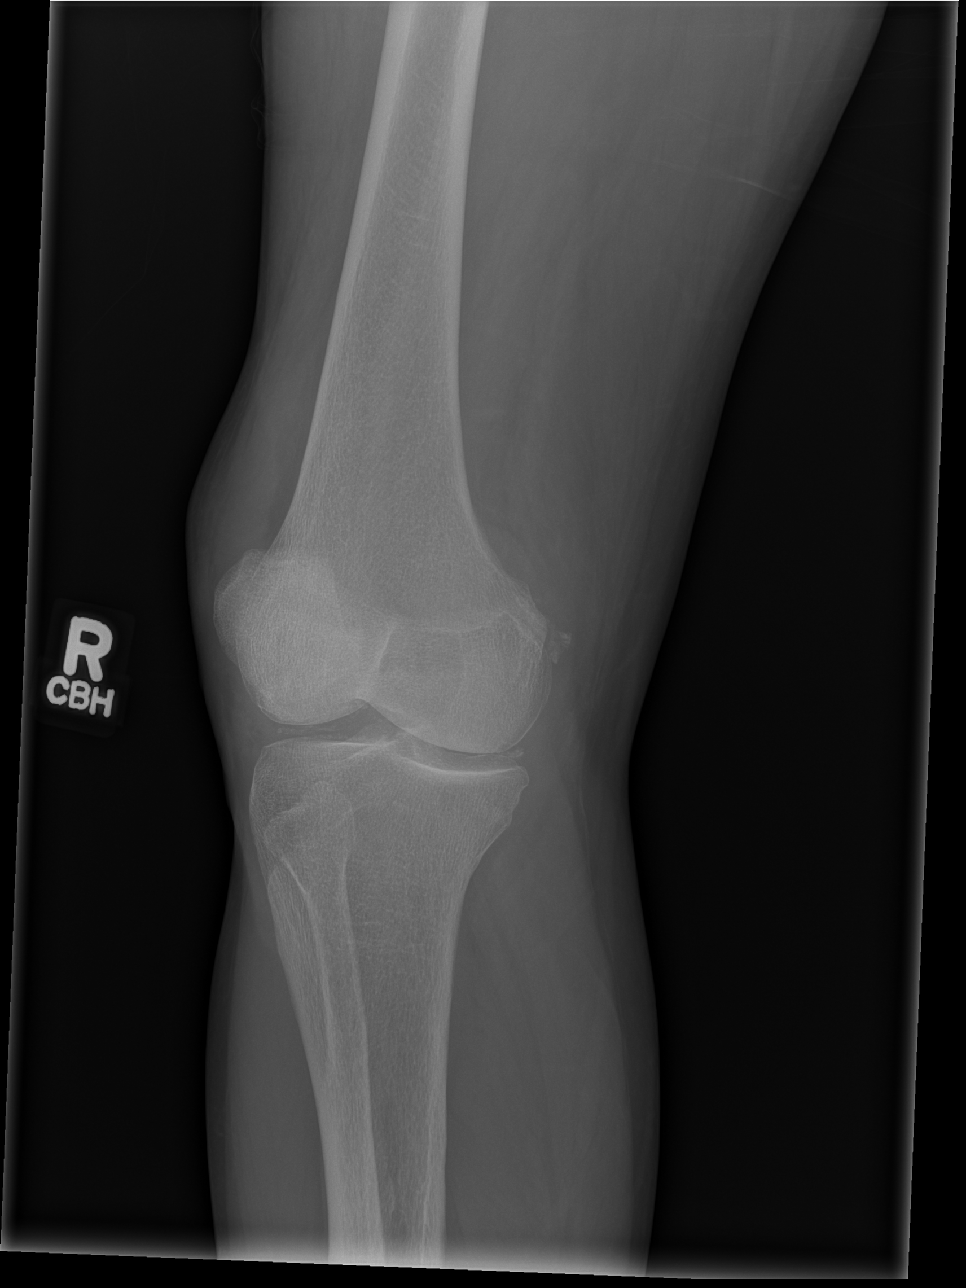

[x knee lat right]
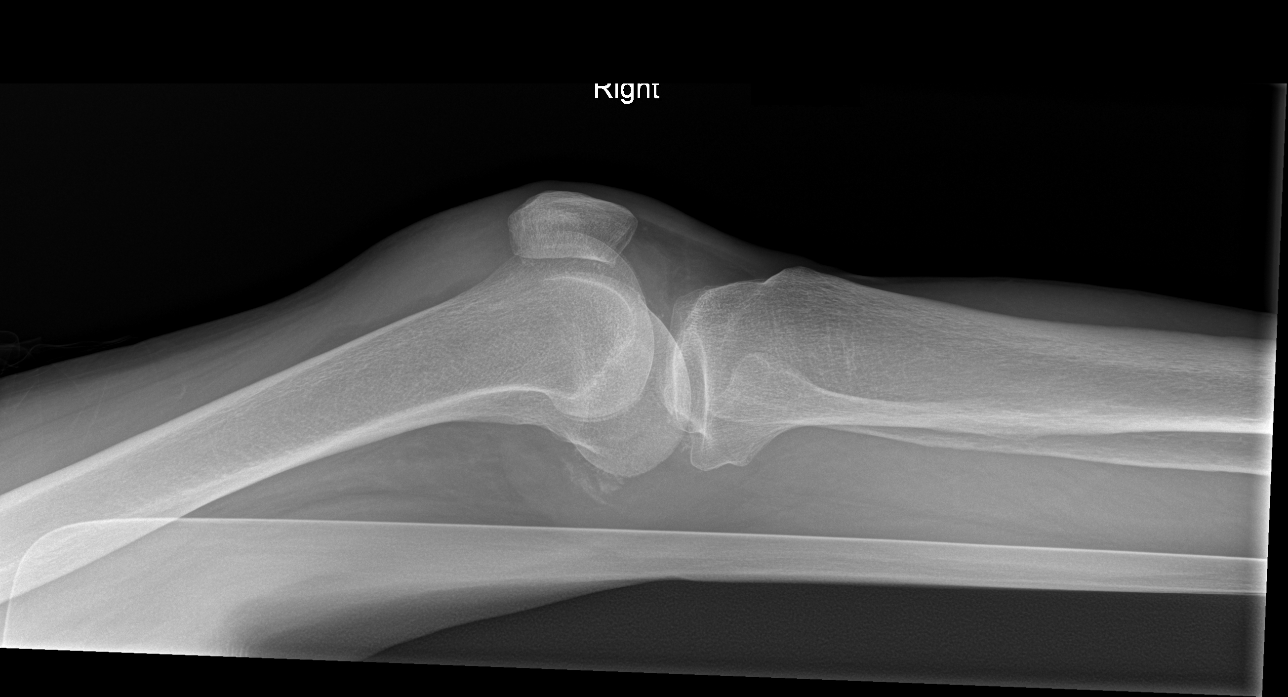

[4 of 4 positions shown; findings below may reference images not displayed]

FINDINGS: There is extensive chondrocalcinosis.  There is a joint
effusion.  There is soft tissue swelling of the distal quadriceps
tendon.  There are calcifications in the posterior aspect of the
joint which may represent loose bodies.
IMPRESSION: Probable calcium pyrophosphate deposition disease arthritis.  Joint
effusion.  Abnormal enlargement of the distal quadriceps tendon.

## 2014-09-08 ENCOUNTER — Other Ambulatory Visit: Payer: Self-pay | Admitting: Adult Health

## 2014-09-10 IMAGING — CT CT ANGIO CHEST
2 of 8 series · 16 of 36 positions shown · IV contrast (APPLIED)
Comparison: CT chest 05/31/2011, 11/24/2009, 10/21/2007.  CTA chest
12/12/2009.

CLINICAL DATA: Chest discomfort in a patient with confirmed deep
venous thrombosis.  Prior history of breast cancer.  Current
history of Graves disease.

CT ANGIOGRAPHY CHEST
TECHNIQUE: Multidetector CT imaging of the chest using the
standard protocol during bolus administration of intravenous
contrast. Multiplanar reconstructed images including MIPs were
obtained and reviewed to evaluate the vascular anatomy.  Lung
windows were reconstructed at contiguous 1 mm intervals.
Contrast: .

[Series 6: pulm embolism 1.0 b25f st · axial · 0.49mm/px · z∈[-232,-6]mm · 15 of 260 slices shown]
[im 17/260  lung]
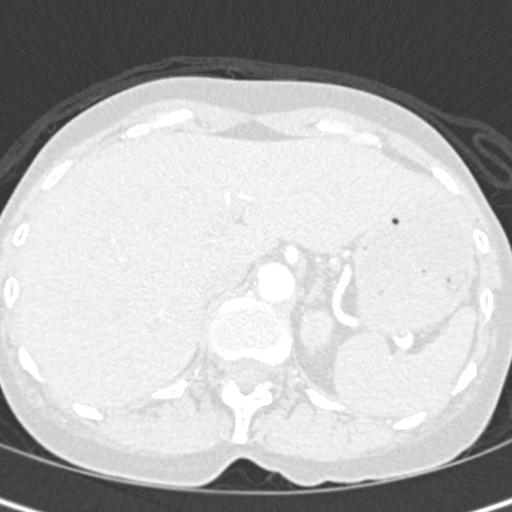
[im 33/260  mediastinal]
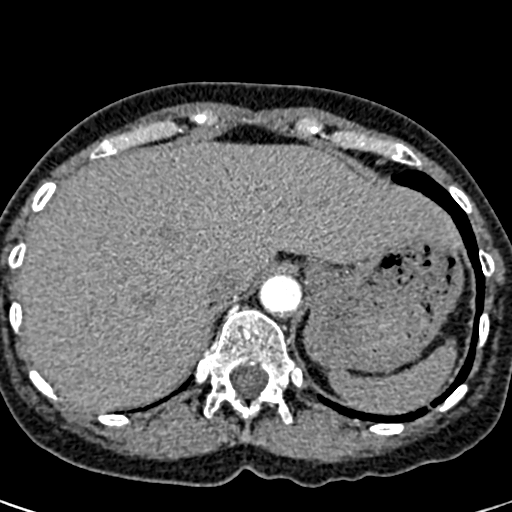
[im 49/260  lung]
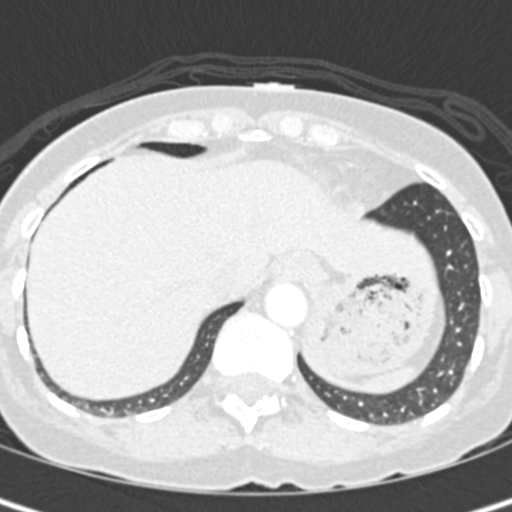
[im 65/260  mediastinal]
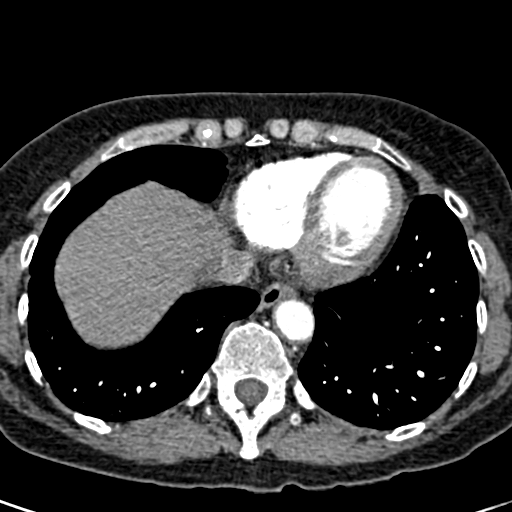
[im 81/260  lung]
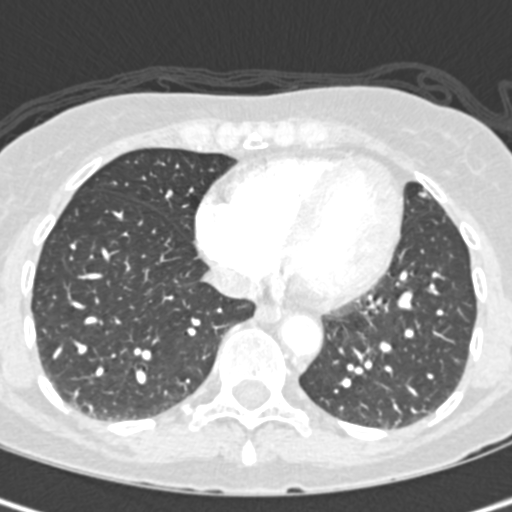
[im 98/260  mediastinal]
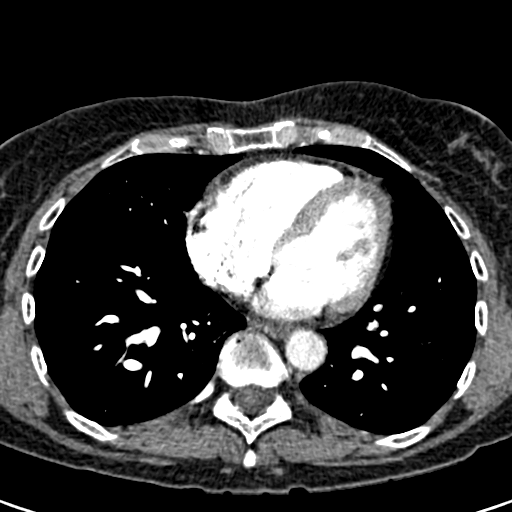
[im 114/260  lung]
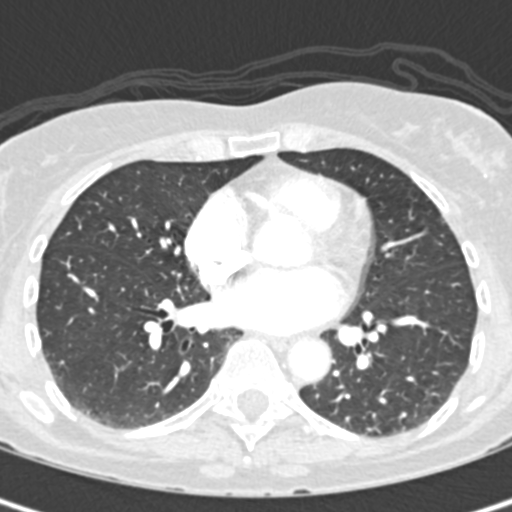
[im 130/260  mediastinal]
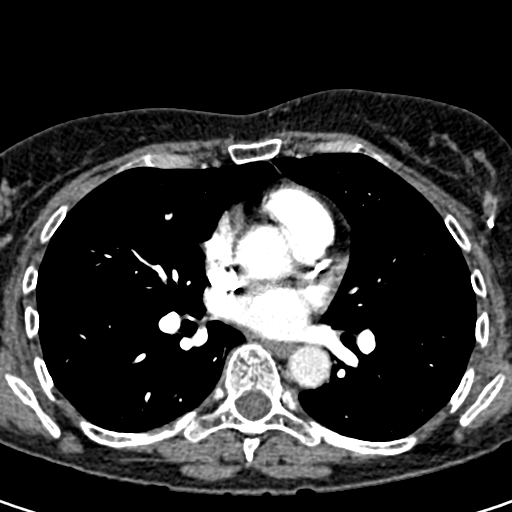
[im 146/260  lung]
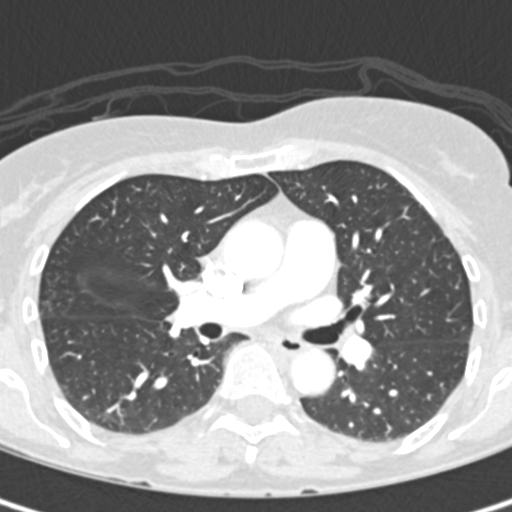
[im 162/260  mediastinal]
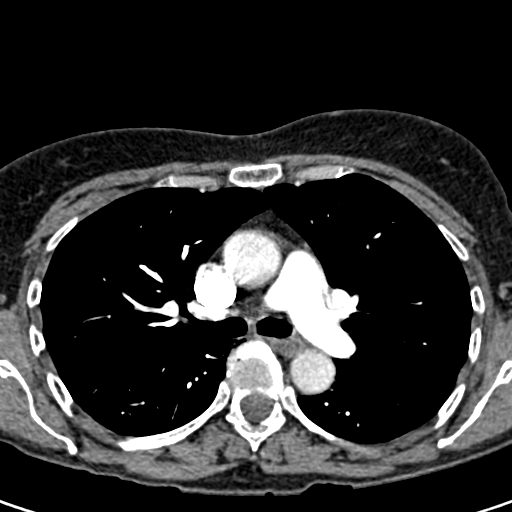
[im 179/260  lung]
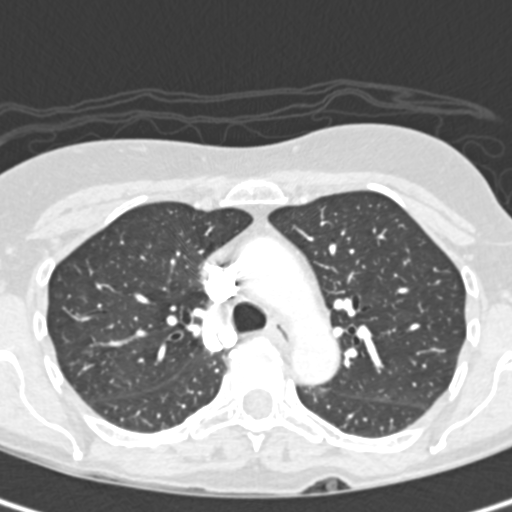
[im 195/260  mediastinal]
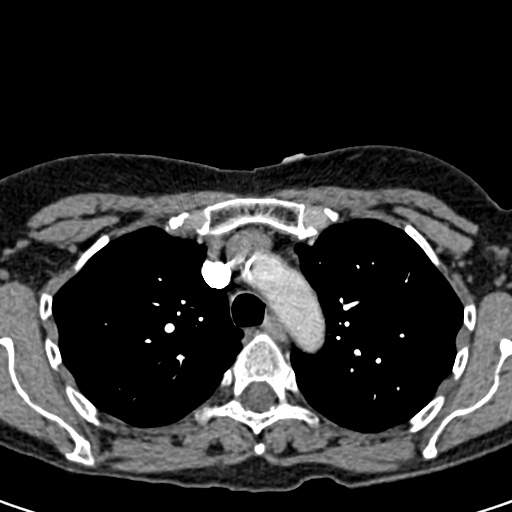
[im 211/260  lung]
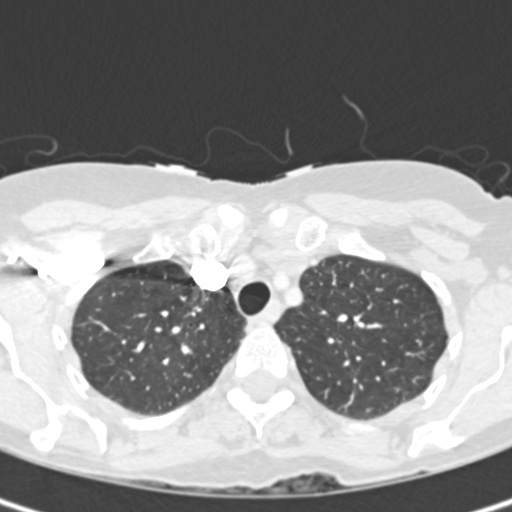
[im 227/260  mediastinal]
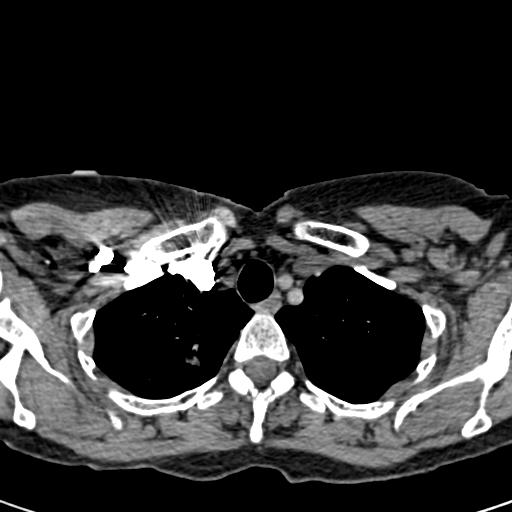
[im 243/260  lung]
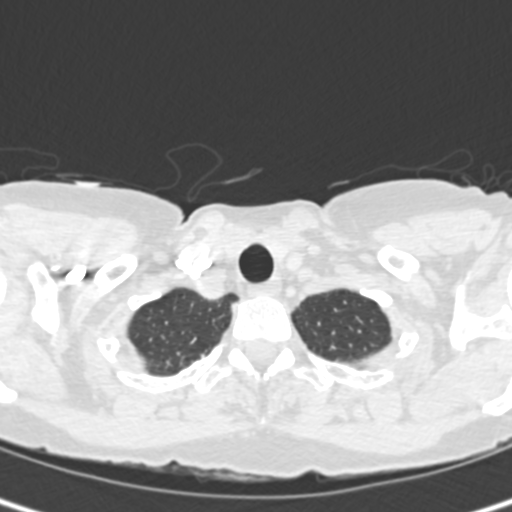

[Series 8: coronals · coronal · 0.63mm/px · 1 of 97 slices shown]
[im 49/97  mediastinal]
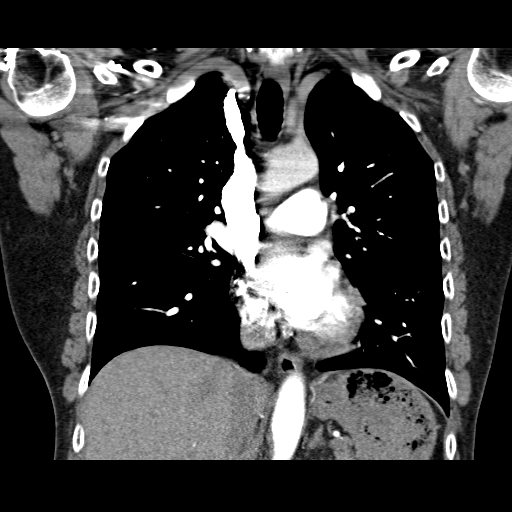

[16 of 36 positions shown; findings below may reference images not displayed]

FINDINGS: Contrast opacification of pulmonary arteries is very
good. No filling defects within either main pulmonary artery or
their branches in either lung to suggest pulmonary embolism.  Heart
size upper normal and stable.  Moderate LAD coronary
atherosclerosis.  Moderate atherosclerosis involving the thoracic
and upper abdominal aorta without aneurysm or dissection.

Focal ground-glass opacity in the right apex measuring
approximately 1.4 x 1.7 x 2.5 cm (series 5, image 13 and coronal
image 68); this does appear to have a pure ground-glass appearance
on the 1 mm reconstructed images.  Micronodular peribronchovascular
opacity in the inferior right upper lobe in the site of the prior
bronchopneumonia in May 2011; there is less opacity on the
current examination.  Similar changes in the lingula which have
improved since the prior examination.  No pleural effusions.  No
pulmonary parenchymal nodules or masses.

No significant mediastinal, hilar, or axillary lymphadenopathy.
Visualized thyroid gland unremarkable.  Visualized upper abdomen
unremarkable for the early arterial phase of enhancement.  Bone
window images unremarkable.
IMPRESSION: 1.  No evidence of pulmonary embolism.
2.  Right apical ground-glass opacity with measurements given
above, new since the most recent examination from May 2011. This
is likely inflammatory.  However, adenocarcinoma cannot be
excluded.  Followup CT is recommended in 3 months, with continued
annual surveillance for a minimum of 3 years if it persists on that
examination.

These recommendations are taken from "Recommendations for the
Management of Subsolid Pulmonary Nodules Detected at CT:  A
[DATE].  Residual slight peribronchovascular opacity in the inferior
right upper lobe and in the lingula at the site of prior
bronchopneumonia in May 2011, consistent with post-inflammatory
change.

## 2014-09-13 ENCOUNTER — Encounter: Payer: Self-pay | Admitting: Internal Medicine

## 2014-09-22 ENCOUNTER — Telehealth: Payer: Self-pay | Admitting: Critical Care Medicine

## 2014-09-22 NOTE — Telephone Encounter (Signed)
#   vials:1 Ordered date:09/22/14 Shipping Date:09/23/14

## 2014-09-23 ENCOUNTER — Ambulatory Visit
Admission: RE | Admit: 2014-09-23 | Discharge: 2014-09-23 | Disposition: A | Discharge status: Home / Self Care | Payer: Medicare Other | Source: Ambulatory Visit | Attending: Obstetrics and Gynecology | Admitting: Obstetrics and Gynecology

## 2014-09-23 DIAGNOSIS — M8580 Other specified disorders of bone density and structure, unspecified site: Secondary | ICD-10-CM

## 2014-09-23 LAB — HM DEXA SCAN

## 2014-09-23 NOTE — Telephone Encounter (Signed)
#   Vials:1 Arrival Date:09/23/14 Lot #:[id number] Exp Date:2/20

## 2014-09-27 ENCOUNTER — Encounter: Payer: Self-pay | Admitting: Family Medicine

## 2014-09-27 IMAGING — CR DG CHEST 2V
2 series · 2 of 2 positions shown · non-contrast
Comparison: Chest CT dated 02/24/2012 and chest x-ray dated
02/20/2012

CLINICAL DATA: Pneumonia.  Cough, fever, and shortness of breath.
Back pain.

CHEST - 2 VIEW

[view not recorded (1 of 2)]
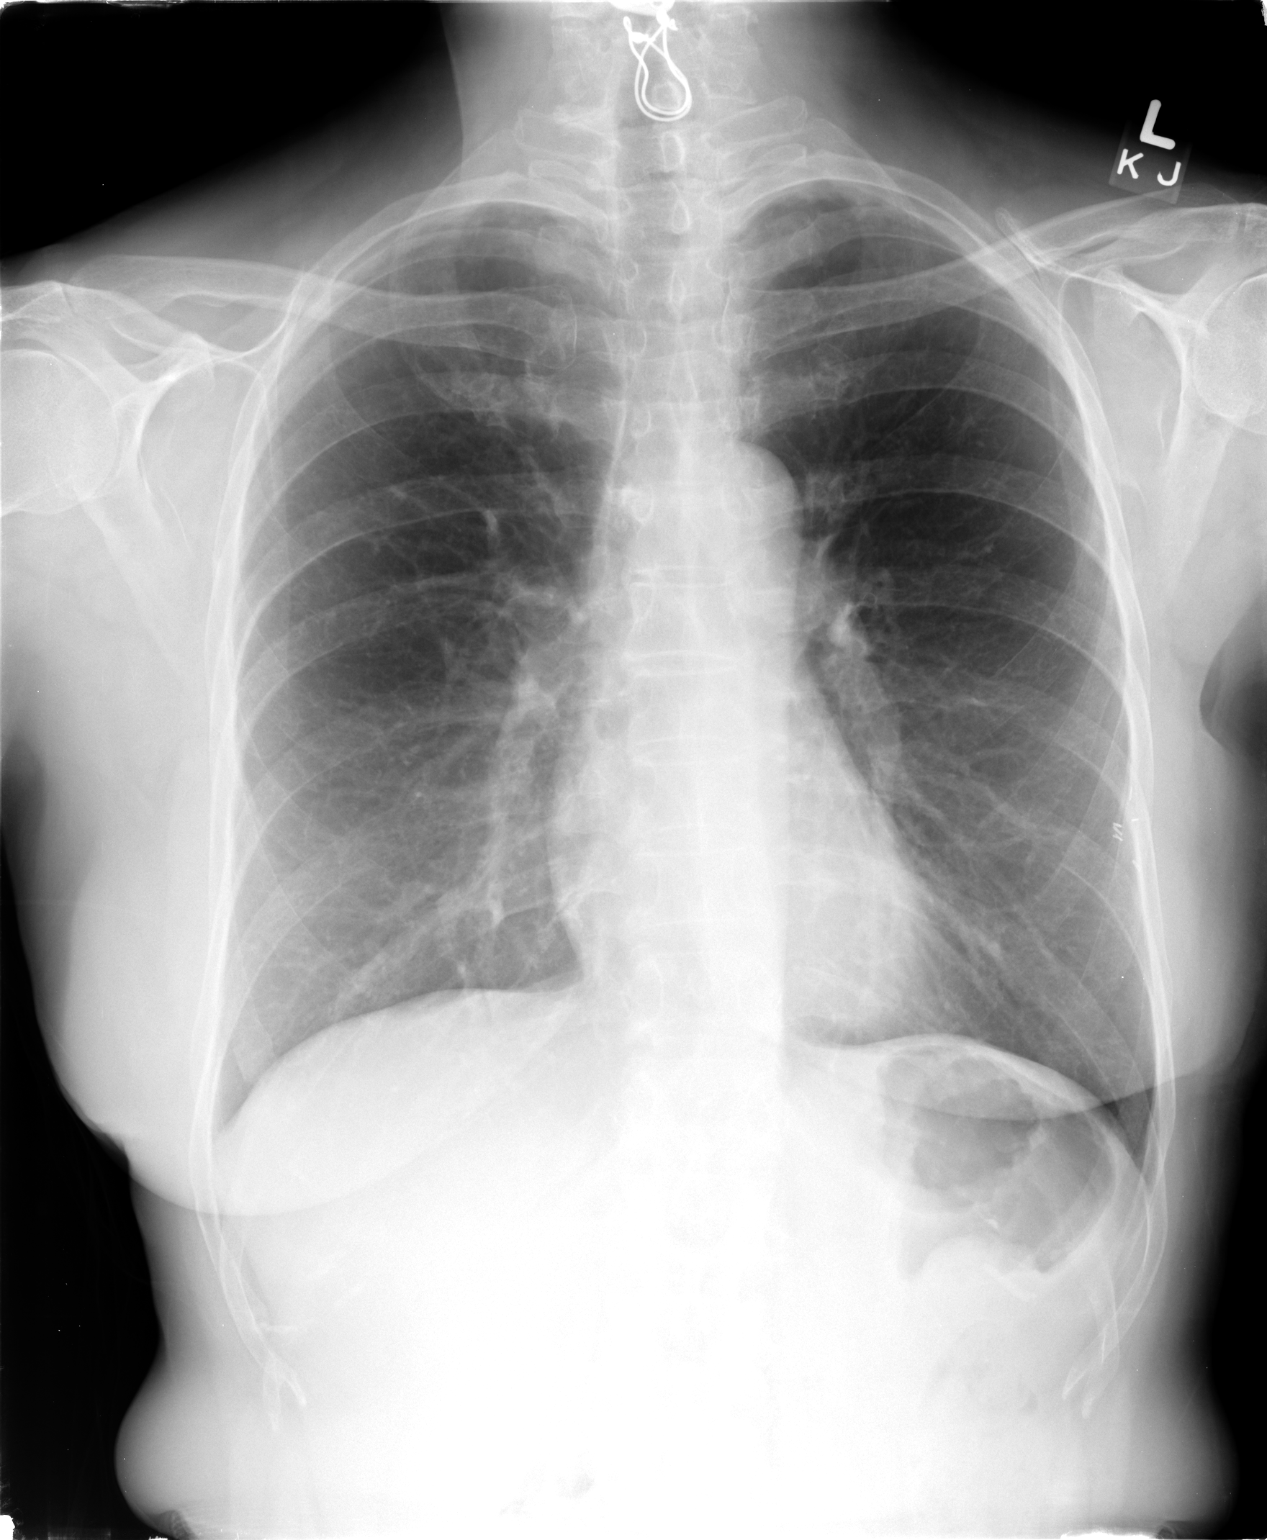

[view not recorded (2 of 2)]
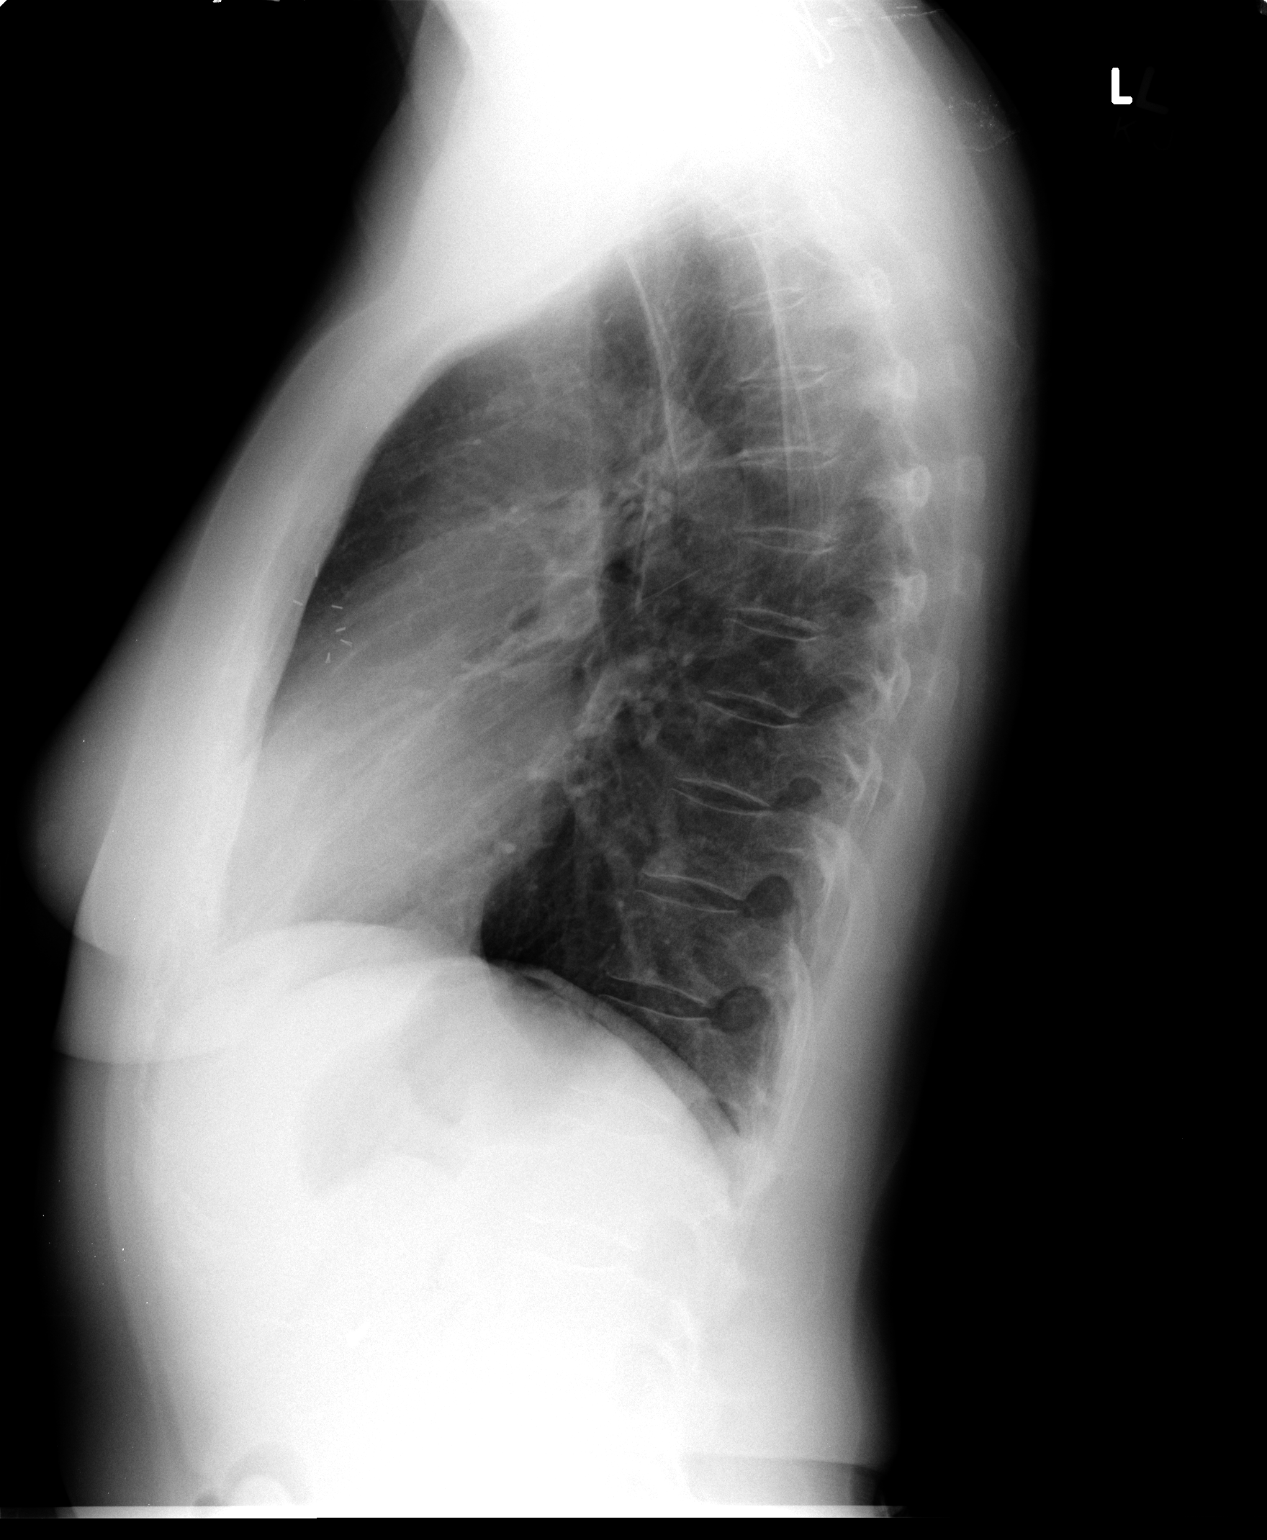

[2 of 2 positions shown; findings below may reference images not displayed]

FINDINGS: There are no infiltrates or effusions.  Heart size and
vascularity are normal.  No osseous abnormality.  The small patchy
infiltrate present in the right lung apex on the prior CT scan is
not visible on the current exam.
IMPRESSION: No acute disease.

## 2014-09-29 ENCOUNTER — Other Ambulatory Visit: Payer: Self-pay | Admitting: Family Medicine

## 2014-09-29 NOTE — Telephone Encounter (Signed)
[employer] calling with clinical questions about Lidoderm patches.  Denial is being appealed. Questions answered.

## 2014-09-30 ENCOUNTER — Encounter: Payer: Self-pay | Admitting: Family Medicine

## 2014-10-04 ENCOUNTER — Ambulatory Visit (INDEPENDENT_AMBULATORY_CARE_PROVIDER_SITE_OTHER): Payer: Medicare Other | Admitting: Critical Care Medicine

## 2014-10-04 ENCOUNTER — Encounter: Payer: Self-pay | Admitting: Critical Care Medicine

## 2014-10-04 VITALS — BP 140/72 | HR 76 | Temp 97.7°F | Ht 61.0 in | Wt 114.6 lb

## 2014-10-04 DIAGNOSIS — J4540 Moderate persistent asthma, uncomplicated: Secondary | ICD-10-CM | POA: Diagnosis not present

## 2014-10-04 DIAGNOSIS — J4551 Severe persistent asthma with (acute) exacerbation: Secondary | ICD-10-CM | POA: Diagnosis not present

## 2014-10-04 MED ORDER — MOMETASONE FUROATE 50 MCG/ACT NA SUSP: 2.0000 | Freq: Two times a day (BID) | NASAL | Status: DC

## 2014-10-04 MED ORDER — CETIRIZINE HCL 10 MG PO TABS: ORAL_TABLET | ORAL | Status: DC

## 2014-10-04 MED ORDER — AZITHROMYCIN 250 MG PO TABS: ORAL_TABLET | ORAL | Status: DC

## 2014-10-04 MED ORDER — AZELASTINE HCL 0.1 % NA SOLN: NASAL | Status: DC

## 2014-10-04 MED ORDER — OMALIZUMAB 150 MG ~~LOC~~ SOLR
150.0000 mg | Freq: Once | SUBCUTANEOUS | Status: AC
  Administered 2014-10-04: 150 mg via SUBCUTANEOUS

## 2014-10-04 NOTE — Patient Instructions (Signed)
Azithromycin Take two once then one daily until gone Hold astelin and zyrtec while on antibiotic then resume when finish antibiotic Work on [employer] inhaler technique Return 3 months with Dr Ashok Cordia

## 2014-10-04 NOTE — Progress Notes (Signed)
Subjective:    Patient ID: Ebony Scott, female    DOB: [birth year], 64 y.o.   MRN: [id number]  HPI 10/04/2014 Chief Complaint  Patient presents with  . 2 month follow up    Pt states her breathing is unchanged since last OV. Pt c/o DOE, prod cough, PND, sore throat, maloderous mucus in nasal cavity.    Not going out of the house, stays exhausted.  No real wheeze, remains severely dyspneic on exertion.  Notes gagging in the AM with the inhaler. Coughs up thick mucus.   Notes foul odor in nasal cavity.tongue is sore. Noted chills no fever.  Pt note significant sore throat, nasal congestion or excess secretions, but NO  fever, chills, sweats, unintended weight loss, pleurtic or exertional chest pain, orthopnea PND, or leg swelling Pt denies any increase in rescue therapy over baseline, denies waking up needing it or having any early am or nocturnal exacerbations of coughing/wheezing/or dyspnea. Pt also denies any obvious fluctuation in symptoms with  weather or environmental change or other alleviating or aggravating factors  pfr 250  Current Medications, Allergies, Complete Past Medical History, Past Surgical History, Family History, and Social History were reviewed in [place] record per todays encounter:  10/04/2014  Review of Systems  Constitutional: Positive for fatigue.  HENT: Positive for postnasal drip, sinus pressure and sore throat. Negative for ear pain, rhinorrhea, trouble swallowing and voice change.   Eyes: Negative.   Respiratory: Positive for cough, chest tightness, shortness of breath and wheezing. Negative for apnea, choking and stridor.   Cardiovascular: Negative.  Negative for chest pain, palpitations and leg swelling.  Gastrointestinal: Negative.  Negative for nausea, vomiting, abdominal pain and abdominal distention.  Genitourinary: Negative.   Musculoskeletal: Negative.  Negative for myalgias and arthralgias.  Skin: Negative.  Negative for  rash.  Allergic/Immunologic: Negative.  Negative for environmental allergies and food allergies.  Neurological: Negative.  Negative for dizziness, syncope, weakness and headaches.  Hematological: Negative.  Negative for adenopathy. Does not bruise/bleed easily.  Psychiatric/Behavioral: Negative.  Negative for sleep disturbance and agitation. The patient is not nervous/anxious.        Objective:   Physical Exam Filed Vitals:   10/04/14 1028  BP: 140/72  Pulse: 76  Temp: 97.7 F (36.5 C)  TempSrc: Oral  Height: 5\' 1"  (1.549 m)  Weight: 114 lb 9.6 oz (51.982 kg)  SpO2: 100%    Gen: Pleasant, well-nourished, in no distress,  normal affect  ENT: No lesions,  mouth clear,  oropharynx clear,+++ postnasal drip, R nares purulence  Neck: No JVD, no TMG, no carotid bruits  Lungs: No use of accessory muscles, no dullness to percussion,insp exp wheezes  Cardiovascular: RRR, heart sounds normal, no murmur or gallops, no peripheral edema  Abdomen: soft and NT, no HSM,  BS normal  Musculoskeletal: No deformities, no cyanosis or clubbing  Neuro: alert, non focal  Skin: Warm, no lesions or rashes  No results found.        Assessment & Plan:  I personally reviewed all images and lab data in the [facility] system as well as any outside material available during this office visit and agree with the  radiology impressions.   Severe persistent asthma with significant atopic features actue sinusitis and bronchitis with asthma flare Plan Azithromycin x 5d Hold antihistamines while on abx Cont inhaled meds    Ebony Scott was seen today for 2 month follow up.  Diagnoses and all orders for this visit:  Extrinsic asthma, moderate persistent, uncomplicated -     omalizumab Arvid Right) injection 150 mg; Inject 150 mg into the skin once.  Asthma with allergic rhinitis, severe persistent, with acute exacerbation  Other orders -     azithromycin (ZITHROMAX) 250 MG tablet; Take two once then one  daily until gone -     cetirizine (ZYRTEC) 10 MG tablet; HOLD while on azithromycin -     azelastine (ASTELIN) 0.1 % nasal spray; HOLD while on antibiotic then resume Use in each nostril as directed -     Discontinue: mometasone (NASONEX) 50 MCG/ACT nasal spray; Place 2 sprays into the nose 2 (two) times daily. -     Discontinue: mometasone (NASONEX) 50 MCG/ACT nasal spray; Place 2 sprays into the nose 2 (two) times daily. -     mometasone (NASONEX) 50 MCG/ACT nasal spray; Place 2 sprays into the nose 2 (two) times daily.

## 2014-10-05 ENCOUNTER — Other Ambulatory Visit (HOSPITAL_COMMUNITY): Payer: Self-pay | Admitting: Allergy and Immunology

## 2014-10-05 ENCOUNTER — Encounter: Payer: Self-pay | Admitting: Internal Medicine

## 2014-10-05 DIAGNOSIS — R0602 Shortness of breath: Secondary | ICD-10-CM

## 2014-10-05 NOTE — Assessment & Plan Note (Signed)
actue sinusitis and bronchitis with asthma flare Plan Azithromycin x 5d Hold antihistamines while on abx Cont inhaled meds

## 2014-10-06 ENCOUNTER — Ambulatory Visit (HOSPITAL_COMMUNITY): Payer: Medicare Other | Attending: Cardiovascular Disease

## 2014-10-06 ENCOUNTER — Other Ambulatory Visit: Payer: Self-pay

## 2014-10-06 DIAGNOSIS — I351 Nonrheumatic aortic (valve) insufficiency: Secondary | ICD-10-CM | POA: Diagnosis not present

## 2014-10-06 DIAGNOSIS — R0602 Shortness of breath: Secondary | ICD-10-CM

## 2014-10-08 ENCOUNTER — Other Ambulatory Visit: Payer: Self-pay

## 2014-10-08 ENCOUNTER — Other Ambulatory Visit: Payer: Self-pay | Admitting: *Deleted

## 2014-10-08 ENCOUNTER — Other Ambulatory Visit: Payer: Self-pay | Admitting: Critical Care Medicine

## 2014-10-08 DIAGNOSIS — J302 Other seasonal allergic rhinitis: Secondary | ICD-10-CM

## 2014-10-08 DIAGNOSIS — J3089 Other allergic rhinitis: Secondary | ICD-10-CM

## 2014-10-08 MED ORDER — MONTELUKAST SODIUM 10 MG PO TABS: ORAL_TABLET | ORAL | Status: DC

## 2014-10-08 MED ORDER — MIRTAZAPINE 30 MG PO TBDP: 30.0000 mg | ORAL_TABLET | Freq: Every day | ORAL | Status: DC

## 2014-10-08 NOTE — Telephone Encounter (Signed)
Refill approved by Barb Merino, RN, [place]

## 2014-10-11 NOTE — Telephone Encounter (Signed)
Appeal also denied.  Pt aware.  Have faxed copy of denial to her Birder Robson per patient request

## 2014-10-18 ENCOUNTER — Telehealth: Payer: Self-pay | Admitting: Critical Care Medicine

## 2014-10-18 NOTE — Telephone Encounter (Addendum)
#   vials:1 Ordered date:10/19/14 Shipping Date:10/20/14

## 2014-10-21 ENCOUNTER — Telehealth: Payer: Self-pay | Admitting: Critical Care Medicine

## 2014-10-21 NOTE — Telephone Encounter (Signed)
ATC pt and received fast busy signal wcb 

## 2014-10-22 MED ORDER — FLUTTER DEVI: Status: DC

## 2014-10-22 NOTE — Telephone Encounter (Signed)
Pt returned call [phone]

## 2014-10-22 NOTE — Telephone Encounter (Signed)
Spoke with pt, aware of recs.  Pt does not have a flutter valve, so I've placed one up front for pt.  She is aware to pick this up at front desk.    Forwarding to Chantel to discuss [employer] billing info.

## 2014-10-22 NOTE — Telephone Encounter (Signed)
Chantel helps with the [employer] billing information; will need to speak with her on Tuesday 10-26-14.

## 2014-10-22 NOTE — Telephone Encounter (Signed)
Regarding pt's concern about congestion, per Dr. Joya Gaskins, please make sure she has a flutter valve to use.  If not, please issue her one.

## 2014-10-22 NOTE — Telephone Encounter (Signed)
Patient received bill from [employer].   Regarding 08/31/14 Xolair injection  They are requesting copy of primary plans EOD  To Katie ------------------------------------  Patient says she has a lot of thick congestion in throat, constantly clearing throat and trying to cough up the mucus, draining all the time making her feel like she is going to choke.  She went to see Dr. Roswell Nickel, he doubled the strength of the [facility] to 228mcg.  She wants to know if there is anything that will help with the congestion?    Pharmacy: [facility]  Allergies  Allergen Reactions  . Dust Mite Extract Shortness Of Breath and Other (See Comments)    "sneezing" (02/20/2012)  . Molds & Smuts Shortness Of Breath  . Morphine Sulfate Itching  . Other Shortness Of Breath and Other (See Comments)    Grass and weeds "sneezing; filled sinuses" (02/20/2012)  . Penicillins Rash and Other (See Comments)    "welts" (02/20/2012)  . Reclast [Zoledronic Acid]     Put in hospital  . Rofecoxib Swelling    Celebrex REACTION: feet swelling  . Shrimp Flavor Anaphylaxis    ALL SHELLFISH  . Tetracycline Hcl Nausea And Vomiting  . Dilaudid [Hydromorphone Hcl] Itching  . Hydrocodone-Acetaminophen Nausea And Vomiting  . Levofloxacin Other (See Comments)    REACTION: GI upset  . Oxycodone Hcl Nausea And Vomiting  . Paroxetine Nausea And Vomiting    Paxil   . Tree Extract Other (See Comments)    "tested and told I was allergic to it; never experienced a reaction to it" (02/20/2012)   Current Outpatient Prescriptions on File Prior to Visit  Medication Sig Dispense Refill  . acetaminophen (TYLENOL ARTHRITIS PAIN) 650 MG CR tablet Take 650 mg by mouth 2 (two) times daily.     Marland Kitchen albuterol (PROAIR HFA) 108 (90 BASE) MCG/ACT inhaler Inhale 1-2 puffs into the lungs every 6 (six) hours as needed for wheezing or shortness of breath. 1 Inhaler 3  . Alpha-D-Galactosidase (BEANO) TABS Take 1 tablet by mouth daily.      . Alpha-Lipoic Acid 600 MG CAPS Take 600 mg by mouth daily. Once a day    . azelastine (ASTELIN) 0.1 % nasal spray HOLD while on antibiotic then resume Use in each nostril as directed 30 mL 2  . azithromycin (ZITHROMAX) 250 MG tablet Take two once then one daily until gone 6 tablet 0  . Calcium Citrate-Vitamin D (CITRACAL MAXIMUM) 315-250 MG-UNIT TABS Take 1 tablet by mouth 2 (two) times daily.     . cetirizine (ZYRTEC) 10 MG tablet HOLD while on azithromycin    . Cholecalciferol (VITAMIN D) 2000 UNITS CAPS 1/2 at bedtime    . colchicine 0.6 MG tablet Take 1 tablet (0.6 mg total) by mouth as needed. 30 tablet 2  . denosumab (PROLIA) 60 MG/ML SOLN injection Inject 60 mg into the skin every 6 (six) months. Administer in upper arm, thigh, or abdomen    . dexlansoprazole (DEXILANT) 60 MG capsule Take 60 mg by mouth daily.    . DULERA 100-5 MCG/ACT AERO INHALE 1 PUFF BY MOUTH TWICE DAILY 13 g 3  . EPINEPHrine (EPIPEN 2-PAK) 0.3 mg/0.3 mL IJ SOAJ injection Inject 0.3 mLs (0.3 mg total) into the muscle once. 1 Device 4  . gabapentin (NEURONTIN) 600 MG tablet Take 1 tablet (600 mg total) by mouth daily. 90 tablet 3  . hydrocortisone (PROCTOZONE-HC) 2.5 % rectal cream Apply as needed as directed 30 g 11  .  lactase (LACTAID) 3000 UNITS tablet Take 3 tablets by mouth as needed. 3-4 tabs with dairy liquids and solids    . mirtazapine (REMERON SOLTAB) 30 MG disintegrating tablet Take 1 tablet (30 mg total) by mouth at bedtime. 30 tablet 1  . mometasone (NASONEX) 50 MCG/ACT nasal spray Place 2 sprays into the nose 2 (two) times daily. 102 g 3  . mometasone-formoterol (DULERA) 100-5 MCG/ACT AERO Inhale 2 puffs into the lungs 2 (two) times daily. 13 g 6  . montelukast (SINGULAIR) 10 MG tablet TAKE 1 TABLET BY MOUTH ONCE DAILY 30 tablet 11  . neomycin-bacitracin-polymyxin (NEOSPORIN) ointment Apply 1 application topically every 12 (twelve) hours. Use on/in nose as directed as needed    . nystatin (MYCOSTATIN)  100000 UNIT/ML suspension Use as directed 5 mLs in the mouth or throat. Swish and spit out twice daily    . nystatin-triamcinolone ointment (MYCOLOG) as needed.    Marland Kitchen omalizumab (XOLAIR) 150 MG injection Inject 150 mg into the skin every 28 (twenty-eight) days. 150 SQ every 4 weeks    . Pancrelipase, Lip-Prot-Amyl, (CREON PO) Take 1 tablet by mouth 3 (three) times daily. CREON 24000-76000-120000 U Takes with meals    . Polyethyl Glycol-Propyl Glycol (SYSTANE ULTRA) 0.4-0.3 % SOLN Apply 1 drop to eye 2 (two) times daily as needed. 1-2 drops each eye every morning and at bedtime    . Prenatal Vit-Sel-Fe Fum-FA (VINATE M) 27-1 MG TABS Take 1 tablet by mouth daily. 30 each 11  . Probiotic Product (ALIGN) 4 MG CAPS Take 1 tablet by mouth daily. Once a day    . propylthiouracil (PTU) 50 MG tablet take 1/2 tablet (25 mg) twice daily 30 tablet 5  . pseudoephedrine-guaifenesin (MUCINEX D) 60-600 MG per tablet Take 1 tablet by mouth every 12 (twelve) hours. 60 tablet 3  . SILENOR 6 MG TABS     . Simethicone (GAS-X EXTRA STRENGTH) 125 MG CAPS Take 2 each by mouth daily as needed. For gas relief    . ZORVOLEX 18 MG CAPS Take 1 capsule by mouth as needed.    . [DISCONTINUED] methimazole (TAPAZOLE) 5 MG tablet Take 5 mg by mouth 3 (three) times daily.       No current facility-administered medications on file prior to visit.

## 2014-10-22 NOTE — Telephone Encounter (Signed)
lmtcb

## 2014-10-25 NOTE — Telephone Encounter (Signed)
Noted and agree. 

## 2014-10-26 ENCOUNTER — Telehealth: Payer: Self-pay | Admitting: Critical Care Medicine

## 2014-10-26 NOTE — Telephone Encounter (Signed)
Reviewed how to use flutter valve with pt.   rx for flutter valve placed in PW's look-at cubby. Will forward to Crystal to make aware and ensure the rx is signed.

## 2014-10-27 ENCOUNTER — Telehealth: Payer: Self-pay | Admitting: Critical Care Medicine

## 2014-10-27 NOTE — Telephone Encounter (Signed)
Will forward to [facility] per her request.

## 2014-10-27 NOTE — Telephone Encounter (Signed)
Made in error

## 2014-10-27 NOTE — Telephone Encounter (Signed)
I have spoken with Chantel and the information to [employer] has been faxed. Thanks.

## 2014-10-27 NOTE — Telephone Encounter (Signed)
Pt has not heard anything and would like to talk to [facility] in regards to PAN. For the 7/12 visit, the need more information.

## 2014-10-27 NOTE — Telephone Encounter (Signed)
Spoke with patient-she is aware that Ebony Scott faxed all information to [employer] today. Nothing more needed at this time.

## 2014-10-27 NOTE — Telephone Encounter (Signed)
Flutter Valve rx signed by PW and placed in [facility] folder.

## 2014-10-29 ENCOUNTER — Encounter: Payer: Self-pay | Admitting: Internal Medicine

## 2014-10-29 ENCOUNTER — Telehealth: Payer: Self-pay | Admitting: Internal Medicine

## 2014-11-01 ENCOUNTER — Ambulatory Visit (INDEPENDENT_AMBULATORY_CARE_PROVIDER_SITE_OTHER): Payer: Medicare Other

## 2014-11-01 DIAGNOSIS — J4520 Mild intermittent asthma, uncomplicated: Secondary | ICD-10-CM

## 2014-11-01 MED ORDER — OMALIZUMAB 150 MG ~~LOC~~ SOLR
150.0000 mg | Freq: Once | SUBCUTANEOUS | Status: AC
  Administered 2014-11-01: 150 mg via SUBCUTANEOUS

## 2014-11-01 NOTE — Telephone Encounter (Signed)
If she is still having dysphagia a Ba swallow w/ tablet

## 2014-11-01 NOTE — Telephone Encounter (Signed)
Left message for patient to call back  

## 2014-11-01 NOTE — Telephone Encounter (Signed)
Dr. Carlean Purl she is asking about a swallowing test.  Just want to confirm it is was a barium swallow you wanted? Please advise

## 2014-11-01 NOTE — Telephone Encounter (Signed)
Patient wants wait on BS until she pays off her bill.  She wants to know if she could have done at another facility not owned by [facility].  She will check with her insurance and call us back if she finds a facility that has better insurance coverage for her.  She will call back if she is interested

## 2014-11-02 ENCOUNTER — Telehealth: Payer: Self-pay | Admitting: Family Medicine

## 2014-11-02 NOTE — Telephone Encounter (Signed)
PA submitted through [email or web address]  

## 2014-11-03 NOTE — Telephone Encounter (Signed)
PA was denied - faxed to [facility]

## 2014-11-04 ENCOUNTER — Telehealth: Payer: Self-pay | Admitting: Critical Care Medicine

## 2014-11-05 NOTE — Telephone Encounter (Signed)
#   Vials:1 Arrival Date:11-05-14 Lot #:[id number] [phone]

## 2014-11-05 NOTE — Telephone Encounter (Signed)
#   vials:1  Ordered date:11-04-14 Shipping Date:11-05-14

## 2014-11-09 ENCOUNTER — Ambulatory Visit (INDEPENDENT_AMBULATORY_CARE_PROVIDER_SITE_OTHER): Payer: Medicare Other | Admitting: Family Medicine

## 2014-11-09 ENCOUNTER — Other Ambulatory Visit: Payer: Medicare Other

## 2014-11-09 DIAGNOSIS — Z23 Encounter for immunization: Secondary | ICD-10-CM | POA: Diagnosis not present

## 2014-11-09 DIAGNOSIS — E039 Hypothyroidism, unspecified: Secondary | ICD-10-CM

## 2014-11-09 DIAGNOSIS — E785 Hyperlipidemia, unspecified: Secondary | ICD-10-CM

## 2014-11-09 DIAGNOSIS — Z79899 Other long term (current) drug therapy: Secondary | ICD-10-CM

## 2014-11-09 LAB — CBC WITH DIFFERENTIAL/PLATELET
Basophils Absolute: 0 10*3/uL (ref 0.0–0.1)
Basophils Relative: 0 % (ref 0–1)
Eosinophils Absolute: 0.2 10*3/uL (ref 0.0–0.7)
Eosinophils Relative: 3 % (ref 0–5)
HCT: 36.9 % (ref 36.0–46.0)
Hemoglobin: 12.4 g/dL (ref 12.0–15.0)
Lymphocytes Relative: 30 % (ref 12–46)
Lymphs Abs: 2 10*3/uL (ref 0.7–4.0)
MCH: 33.2 pg (ref 26.0–34.0)
MCHC: 33.6 g/dL (ref 30.0–36.0)
MCV: 98.9 fL (ref 78.0–100.0)
MPV: 9.5 fL (ref 8.6–12.4)
Monocytes Absolute: 0.8 10*3/uL (ref 0.1–1.0)
Monocytes Relative: 12 % (ref 3–12)
Neutro Abs: 3.7 10*3/uL (ref 1.7–7.7)
Neutrophils Relative %: 55 % (ref 43–77)
Platelets: 233 10*3/uL (ref 150–400)
RBC: 3.73 MIL/uL — ABNORMAL LOW (ref 3.87–5.11)
RDW: 13.3 % (ref 11.5–15.5)
WBC: 6.7 10*3/uL (ref 4.0–10.5)

## 2014-11-09 LAB — COMPLETE METABOLIC PANEL WITH GFR
ALT: 31 U/L — ABNORMAL HIGH (ref 6–29)
AST: 30 U/L (ref 10–35)
Albumin: 4.1 g/dL (ref 3.6–5.1)
Alkaline Phosphatase: 29 U/L — ABNORMAL LOW (ref 33–130)
BUN: 27 mg/dL — ABNORMAL HIGH (ref 7–25)
CO2: 27 mmol/L (ref 20–31)
Calcium: 9.3 mg/dL (ref 8.6–10.4)
Chloride: 104 mmol/L (ref 98–110)
Creat: 0.98 mg/dL (ref 0.50–0.99)
GFR, Est African American: 70 mL/min (ref >=60)
GFR, Est Non African American: 60 mL/min (ref >=60)
Glucose, Bld: 82 mg/dL (ref 70–99)
Potassium: 4.4 mmol/L (ref 3.5–5.3)
Sodium: 141 mmol/L (ref 135–146)
Total Bilirubin: 0.6 mg/dL (ref 0.2–1.2)
Total Protein: 6.5 g/dL (ref 6.1–8.1)

## 2014-11-09 LAB — LIPID PANEL
Cholesterol: 282 mg/dL — ABNORMAL HIGH (ref 125–200)
HDL: 80 mg/dL (ref >=46)
LDL Cholesterol: 181 mg/dL — ABNORMAL HIGH (ref <130)
Total CHOL/HDL Ratio: 3.5 Ratio (ref <=5.0)
Triglycerides: 103 mg/dL (ref <150)
VLDL: 21 mg/dL (ref <30)

## 2014-11-09 LAB — TSH: TSH: 4.374 u[IU]/mL (ref 0.350–4.500)

## 2014-11-11 ENCOUNTER — Telehealth: Payer: Self-pay | Admitting: Critical Care Medicine

## 2014-11-11 ENCOUNTER — Encounter: Payer: Self-pay | Admitting: Family Medicine

## 2014-11-11 ENCOUNTER — Ambulatory Visit (INDEPENDENT_AMBULATORY_CARE_PROVIDER_SITE_OTHER): Payer: Medicare Other | Admitting: Family Medicine

## 2014-11-11 VITALS — BP 118/70 | HR 74 | Temp 98.5°F | Resp 18 | Ht 62.0 in | Wt 116.0 lb

## 2014-11-11 DIAGNOSIS — E0590 Thyrotoxicosis, unspecified without thyrotoxic crisis or storm: Secondary | ICD-10-CM | POA: Diagnosis not present

## 2014-11-11 DIAGNOSIS — E785 Hyperlipidemia, unspecified: Secondary | ICD-10-CM | POA: Diagnosis not present

## 2014-11-11 MED ORDER — ATORVASTATIN CALCIUM 40 MG PO TABS: 40.0000 mg | ORAL_TABLET | Freq: Every day | ORAL | Status: DC

## 2014-11-11 NOTE — Telephone Encounter (Signed)
Spoke with the pt  She states that she spoke with [employer] today regarding her pt assistance for Nasonex, and that they advised her they had to fax form back due to missing info  She believes that they just need him License number added  She also wants the prescription to be for 6 devices rather than the usual 3 since she takes 2 sprays bid  CJ, please advise if you have received form thanks

## 2014-11-11 NOTE — Progress Notes (Signed)
Subjective:    Patient ID: Ebony Scott, female    DOB: 09-May-1950, 64 y.o.   MRN: [id number]  HPI 05/11/14 Has recently been seen by her gastroenterologist and has undergone an endoscopy due for early satiety and nausea. The endoscopy was relatively normal. Her gastroenterologist started her on Remeron at night to try to stimulate her appetite. She is only been on the medication for 11 days. Her weight is relatively stable at the present time. She denies any fevers or chills weight loss melena or hematochezia. Her mammogram and bone density tests are scheduled for later in August. She is also scheduled for a colonoscopy later this year. Her Pap smear is up-to-date. Wt Readings from Last 3 Encounters:  10/04/14 114 lb 9.6 oz (51.982 kg)  08/31/14 118 lb 12.8 oz (53.887 kg)  08/03/14 114 lb 6.4 oz (51.891 kg)  At that time, my plan was: Physical exam is completely normal. Her immunizations are up-to-date. Her cancer screening is up-to-date. Patient will get her mammogram and bone density in August. Her Pap smear is up-to-date. Her colonoscopy is scheduled for later this year. Patient's lab work is excellent. I recommended she discontinue Crestor. Also recommended she discontinue gabapentin. I would like to try to decrease the polypharmacy which may be contributing to her nausea. I believe her nausea and early satiety is likely anxiety related. If he continues to worsen we may need to focus our efforts on better controlling her anxiety.  11/11/14 Patient is here today for follow up.  Labs show a significant elevation in her LDL cholesterol, more than I would have expected.  Otherwise she is doing well. She continues to complain of stomach upset which she is working with her GI doctor on. She also complains of chronic shortness of breath. She recently had an echocardiogram performed which revealed grade 1 diastolic dysfunction. Patient has no substantial swelling in her legs. I still believe her shortness  of breath is primarily due to her respiratory issues, complicated by anxiety Her most recent lab work as listed below: Lab on 11/09/2014  Component Date Value Ref Range Status  . Sodium 11/09/2014 141  135 - 146 mmol/L Final  . Potassium 11/09/2014 4.4  3.5 - 5.3 mmol/L Final  . Chloride 11/09/2014 104  98 - 110 mmol/L Final  . CO2 11/09/2014 27  20 - 31 mmol/L Final  . Glucose, Bld 11/09/2014 82  70 - 99 mg/dL Final  . BUN 11/09/2014 27* 7 - 25 mg/dL Final  . Creat 11/09/2014 0.98  0.50 - 0.99 mg/dL Final  . Total Bilirubin 11/09/2014 0.6  0.2 - 1.2 mg/dL Final  . Alkaline Phosphatase 11/09/2014 29* 33 - 130 U/L Final  . AST 11/09/2014 30  10 - 35 U/L Final  . ALT 11/09/2014 31* 6 - 29 U/L Final  . Total Protein 11/09/2014 6.5  6.1 - 8.1 g/dL Final  . Albumin 11/09/2014 4.1  3.6 - 5.1 g/dL Final  . Calcium 11/09/2014 9.3  8.6 - 10.4 mg/dL Final  . GFR, Est African American 11/09/2014 70  >=60 mL/min Final  . GFR, Est Non African American 11/09/2014 60  >=60 mL/min Final   Comment:   The estimated GFR is a calculation valid for adults (>=41 years old) that uses the CKD-EPI algorithm to adjust for age and sex. It is   not to be used for children, pregnant women, hospitalized patients,    patients on dialysis, or with rapidly changing kidney function. According to the  NKDEP, eGFR >89 is normal, 60-89 shows mild impairment, 30-59 shows moderate impairment, 15-29 shows severe impairment and <15 is ESRD.     . TSH 11/09/2014 4.374  0.350 - 4.500 uIU/mL Final   Comment:   Footnotes:  (1) ** Please note change in unit of measure and reference range(s). **     . Cholesterol 11/09/2014 282* 125 - 200 mg/dL Final  . Triglycerides 11/09/2014 103  <150 mg/dL Final  . HDL 61/68/3729 80  >=46 mg/dL Final  . Total CHOL/HDL Ratio 11/09/2014 3.5  <=0.2 Ratio Final  . VLDL 11/09/2014 21  <30 mg/dL Final  . LDL Cholesterol 11/09/2014 181* <130 mg/dL Final   Comment:   Total Cholesterol/HDL  Ratio:CHD Risk                        Coronary Heart Disease Risk Table                                        Men       Women          1/2 Average Risk              3.4        3.3              Average Risk              5.0        4.4           2X Average Risk              9.6        7.1           3X Average Risk             23.4       11.0 Use the calculated Patient Ratio above and the CHD Risk table  to determine the patient's CHD Risk.   . WBC 11/09/2014 6.7  4.0 - 10.5 K/uL Final  . RBC 11/09/2014 3.73* 3.87 - 5.11 MIL/uL Final  . Hemoglobin 11/09/2014 12.4  12.0 - 15.0 g/dL Final  . HCT 01/04/5207 36.9  36.0 - 46.0 % Final  . MCV 11/09/2014 98.9  78.0 - 100.0 fL Final  . MCH 11/09/2014 33.2  26.0 - 34.0 pg Final  . MCHC 11/09/2014 33.6  30.0 - 36.0 g/dL Final  . RDW 04/13/3610 13.3  11.5 - 15.5 % Final  . Platelets 11/09/2014 233  150 - 400 K/uL Final  . MPV 11/09/2014 9.5  8.6 - 12.4 fL Final  . Neutrophils Relative % 11/09/2014 55  43 - 77 % Final  . Neutro Abs 11/09/2014 3.7  1.7 - 7.7 K/uL Final  . Lymphocytes Relative 11/09/2014 30  12 - 46 % Final  . Lymphs Abs 11/09/2014 2.0  0.7 - 4.0 K/uL Final  . Monocytes Relative 11/09/2014 12  3 - 12 % Final  . Monocytes Absolute 11/09/2014 0.8  0.1 - 1.0 K/uL Final  . Eosinophils Relative 11/09/2014 3  0 - 5 % Final  . Eosinophils Absolute 11/09/2014 0.2  0.0 - 0.7 K/uL Final  . Basophils Relative 11/09/2014 0  0 - 1 % Final  . Basophils Absolute 11/09/2014 0.0  0.0 - 0.1 K/uL Final  . Smear Review 11/09/2014 Criteria for review not met  Final   Past Medical History  Diagnosis Date  . Chronic neck pain   . Allergic rhinitis   . Moderate persistent asthma     -FeV1 72% 2011, -IgE 102 2011, CT sinus Neg 2011  . GERD (gastroesophageal reflux disease)   . Hypothyroidism   . Osteoporosis     on reclast yearly  . Hiatal hernia   . Diverticulosis   . Hemorrhoids   . Asthma   . IBS (irritable bowel syndrome)   . Complication  of anesthesia     "had hard time waking up from it several times" (02/20/2012)  . Hypercholesteremia   . Pneumonia 04/2011; ~ 11/2011    "double; single" (02/20/2012)  . Exertional dyspnea   . Graves disease   . Headache(784.0)     "related to allergies; more at different times during the year" (02/20/2012)  . Arthritis     "mostly the hands" (02/20/2012)  . Fibromyalgia 11/2011  . Chronic lower back pain   . Depression     "some; don't take anything for it" (02/20/2012)  . Breast cancer 1998    in remission  . Anxiety   . DVT (deep venous thrombosis)   . Allergy     SEASONAL  . Anemia   . Cataract     REMOVED   Past Surgical History  Procedure Laterality Date  . Cervical fusion  2003    C3-C4  . Cervical discectomy  10/2001    C5-C6  . Vesicovaginal fistula closure w/ tah  1988  . Nasal septum surgery  1980's  . Anterior and posterior repair  1990's  . Breast lumpectomy  1998    left  . Debridement tennis elbow  ?1970's    right  . Knee arthroplasty  ?1990's    "?right; w/cartilage repair" (02/20/2012)  . Carpometacarpel ([facility]) fusion of thumb with autograft from radius  ~ 2009    "both thumbs" (02/20/2012)  . Cataract extraction w/ intraocular lens  implant, bilateral  2012  . Tonsillectomy  ~ 1953  . Posterior cervical fusion/foraminotomy  2004    "failed initial fusion; rewired  anterior neck" (02/20/2012)  . Colonoscopy    . Appendectomy    . Cholecystectomy    . Hysterectomy    . Esophagogastroduodenoscopy     Current Outpatient Prescriptions on File Prior to Visit  Medication Sig Dispense Refill  . acetaminophen (TYLENOL ARTHRITIS PAIN) 650 MG CR tablet Take 650 mg by mouth 2 (two) times daily.     Marland Kitchen albuterol (PROAIR HFA) 108 (90 BASE) MCG/ACT inhaler Inhale 1-2 puffs into the lungs every 6 (six) hours as needed for wheezing or shortness of breath. 1 Inhaler 3  . Alpha-D-Galactosidase (BEANO) TABS Take 1 tablet by mouth daily.     . Alpha-Lipoic Acid 600 MG CAPS Take  600 mg by mouth daily. Once a day    . azelastine (ASTELIN) 0.1 % nasal spray HOLD while on antibiotic then resume Use in each nostril as directed 30 mL 2  . azithromycin (ZITHROMAX) 250 MG tablet Take two once then one daily until gone 6 tablet 0  . Calcium Citrate-Vitamin D (CITRACAL MAXIMUM) 315-250 MG-UNIT TABS Take 1 tablet by mouth 2 (two) times daily.     . cetirizine (ZYRTEC) 10 MG tablet HOLD while on azithromycin    . Cholecalciferol (VITAMIN D) 2000 UNITS CAPS 1/2 at bedtime    . colchicine 0.6 MG tablet Take 1 tablet (0.6 mg total) by mouth as needed. [place]  tablet 2  . denosumab (PROLIA) 60 MG/ML SOLN injection Inject 60 mg into the skin every 6 (six) months. Administer in upper arm, thigh, or abdomen    . dexlansoprazole (DEXILANT) 60 MG capsule Take 60 mg by mouth daily.    . DULERA 100-5 MCG/ACT AERO INHALE 1 PUFF BY MOUTH TWICE DAILY 13 g 3  . EPINEPHrine (EPIPEN 2-PAK) 0.3 mg/0.3 mL IJ SOAJ injection Inject 0.3 mLs (0.3 mg total) into the muscle once. 1 Device 4  . gabapentin (NEURONTIN) 600 MG tablet Take 1 tablet (600 mg total) by mouth daily. 90 tablet 3  . hydrocortisone (PROCTOZONE-HC) 2.5 % rectal cream Apply as needed as directed 30 g 11  . lactase (LACTAID) 3000 UNITS tablet Take 3 tablets by mouth as needed. 3-4 tabs with dairy liquids and solids    . mirtazapine (REMERON SOLTAB) 30 MG disintegrating tablet Take 1 tablet (30 mg total) by mouth at bedtime. 30 tablet 1  . mometasone (NASONEX) 50 MCG/ACT nasal spray Place 2 sprays into the nose 2 (two) times daily. 102 g 3  . mometasone-formoterol (DULERA) 100-5 MCG/ACT AERO Inhale 2 puffs into the lungs 2 (two) times daily. 13 g 6  . montelukast (SINGULAIR) 10 MG tablet TAKE 1 TABLET BY MOUTH ONCE DAILY 30 tablet 11  . neomycin-bacitracin-polymyxin (NEOSPORIN) ointment Apply 1 application topically every 12 (twelve) hours. Use on/in nose as directed as needed    . nystatin (MYCOSTATIN) 100000 UNIT/ML suspension Use as  directed 5 mLs in the mouth or throat. Swish and spit out twice daily    . nystatin-triamcinolone ointment (MYCOLOG) as needed.    Marland Kitchen omalizumab (XOLAIR) 150 MG injection Inject 150 mg into the skin every 28 (twenty-eight) days. 150 SQ every 4 weeks    . Pancrelipase, Lip-Prot-Amyl, (CREON PO) Take 1 tablet by mouth 3 (three) times daily. CREON 24000-76000-120000 U Takes with meals    . Polyethyl Glycol-Propyl Glycol (SYSTANE ULTRA) 0.4-0.3 % SOLN Apply 1 drop to eye 2 (two) times daily as needed. 1-2 drops each eye every morning and at bedtime    . Prenatal Vit-Sel-Fe Fum-FA (VINATE M) 27-1 MG TABS Take 1 tablet by mouth daily. 30 each 11  . Probiotic Product (ALIGN) 4 MG CAPS Take 1 tablet by mouth daily. Once a day    . propylthiouracil (PTU) 50 MG tablet take 1/2 tablet (25 mg) twice daily 30 tablet 5  . pseudoephedrine-guaifenesin (MUCINEX D) 60-600 MG per tablet Take 1 tablet by mouth every 12 (twelve) hours. 60 tablet 3  . Respiratory Therapy Supplies (FLUTTER) DEVI Use as directed 1 each 0  . SILENOR 6 MG TABS     . Simethicone (GAS-X EXTRA STRENGTH) 125 MG CAPS Take 2 each by mouth daily as needed. For gas relief    . ZORVOLEX 18 MG CAPS Take 1 capsule by mouth as needed.    . [DISCONTINUED] methimazole (TAPAZOLE) 5 MG tablet Take 5 mg by mouth 3 (three) times daily.       No current facility-administered medications on file prior to visit.   Allergies  Allergen Reactions  . Dust Mite Extract Shortness Of Breath and Other (See Comments)    "sneezing" (02/20/2012)  . Molds & Smuts Shortness Of Breath  . Morphine Sulfate Itching  . Other Shortness Of Breath and Other (See Comments)    Grass and weeds "sneezing; filled sinuses" (02/20/2012)  . Penicillins Rash and Other (See Comments)    "welts" (02/20/2012)  . Reclast [Zoledronic Acid]  Put in hospital  . Rofecoxib Swelling    Celebrex REACTION: feet swelling  . Shrimp Flavor Anaphylaxis    ALL SHELLFISH  . Tetracycline Hcl  Nausea And Vomiting  . Dilaudid [Hydromorphone Hcl] Itching  . Hydrocodone-Acetaminophen Nausea And Vomiting  . Levofloxacin Other (See Comments)    REACTION: GI upset  . Oxycodone Hcl Nausea And Vomiting  . Paroxetine Nausea And Vomiting    Paxil   . Tree Extract Other (See Comments)    "tested and told I was allergic to it; never experienced a reaction to it" (02/20/2012)   Social History   Social History  . Marital Status: Divorced    Spouse Name: N/A  . Number of Children: 2  . Years of Education: college   Occupational History  . Disabled     Retired Print production planner  .     Social History Main Topics  . Smoking status: Never Smoker   . Smokeless tobacco: Never Used  . Alcohol Use: 0.0 oz/week    0 Standard drinks or equivalent per week     Comment: 02/20/2012 "couple glasses of wine/6 months"  . Drug Use: No  . Sexual Activity: No   Other Topics Concern  . Not on file   Social History Narrative   Patient lives at home alone. Patient  divorced.    Patient has her BS degree.   Right handed.   Caffeine- sometimes coffee.            Family History  Problem Relation Age of Onset  . Allergies Mother   . Heart disease Mother   . Arthritis Mother   . Lung cancer Mother   . Diabetes Mother   . Allergies Father   . Heart disease Father   . Arthritis Father   . Colon cancer      Maternal half aunt/Maternal half uncle  . Colitis Daughter   . Diabetes Maternal Grandfather      Review of Systems  All other systems reviewed and are negative.      Objective:   Physical Exam  Constitutional: She is oriented to person, place, and time. She appears well-developed and well-nourished. No distress.  HENT:  Head: Normocephalic and atraumatic.  Right Ear: External ear normal.  Left Ear: External ear normal.  Nose: Nose normal.  Mouth/Throat: Oropharynx is clear and moist. No oropharyngeal exudate.  Eyes: Conjunctivae and EOM are normal. Pupils are  equal, round, and reactive to light. Right eye exhibits no discharge. Left eye exhibits no discharge. No scleral icterus.  Neck: Normal range of motion. Neck supple. No JVD present. No tracheal deviation present. No thyromegaly present.  Cardiovascular: Normal rate, regular rhythm and intact distal pulses.  Exam reveals no gallop and no friction rub.   Murmur heard. Pulmonary/Chest: Effort normal and breath sounds normal. No stridor. No respiratory distress. She has no wheezes. She has no rales. She exhibits no tenderness.  Abdominal: Soft. Bowel sounds are normal. She exhibits no distension and no mass. There is no tenderness. There is no rebound and no guarding.  Musculoskeletal: Normal range of motion. She exhibits no edema or tenderness.  Lymphadenopathy:    She has no cervical adenopathy.  Neurological: She is alert and oriented to person, place, and time. She has normal reflexes. No cranial nerve deficit. She exhibits normal muscle tone. Coordination normal.  Skin: Skin is warm. No rash noted. She is not diaphoretic. No erythema. No pallor.  Psychiatric: She has a normal  mood and affect. Her behavior is normal. Judgment and thought content normal.  Vitals reviewed.         Assessment & Plan:  HLD (hyperlipidemia) - Plan: atorvastatin (LIPITOR) 40 MG tablet  Hyperthyroidism  Start Lipitor 40 mg by mouth daily and recheck fasting lipid panel in 3 months. TSH is within the therapeutic range and therefore I'll make no changes in her propylthiouracil.

## 2014-11-12 ENCOUNTER — Other Ambulatory Visit: Payer: Self-pay | Admitting: *Deleted

## 2014-11-12 MED ORDER — ALBUTEROL SULFATE HFA 108 (90 BASE) MCG/ACT IN AERS: 1.0000 | INHALATION_SPRAY | Freq: Four times a day (QID) | RESPIRATORY_TRACT | Status: DC | PRN

## 2014-11-12 NOTE — Telephone Encounter (Signed)
Refill appropriate and filled per protocol. 

## 2014-11-17 NOTE — Telephone Encounter (Signed)
Cystal, did you receive this form? thanks

## 2014-11-17 NOTE — Telephone Encounter (Signed)
I have not received a fax from [employer].   Called [employer] at [phone], spoke with [place].  Was advised they mailed the original to our office in Aug.  They are not able to fax these forms.  Per Vivi, we can reprint the form, have pt and MD completely fill out and sign and send back to them.   Forms reprinted.  Pt will need to sign. Forms left at front for pt signature.  lmomtcb for pt to advise of this.    Note: After pt signs form, please return to me to complete.

## 2014-11-18 NOTE — Telephone Encounter (Signed)
LMTCB

## 2014-11-18 NOTE — Telephone Encounter (Signed)
Patient came in and signed [employer] Patient Assistance Program Enrollment Form and left with me.  I will place this in Dr. Bettina Gavia folder for [employer].

## 2014-11-18 NOTE — Telephone Encounter (Signed)
Will forward to [place] per her request

## 2014-11-18 NOTE — Telephone Encounter (Signed)
Patient came in earlier today and filled out paperwork.  She just has called back and has questions for Crystal.  She is looking for appeal form?  States foundation sent this to Dr. Joya Gaskins. Please call her at [phone].

## 2014-11-19 NOTE — Telephone Encounter (Signed)
Pt assistance application for nasonex was signed by TP in PW's absence.  I mailed this to [employer].  Pt is aware.  Nothing further needed at this time.

## 2014-11-26 ENCOUNTER — Telehealth: Payer: Self-pay

## 2014-11-26 NOTE — Telephone Encounter (Signed)
Patient called to let us know she didn't have the rhinoscopy done, Dr Redmond Baseman was able to look down her nose instead. She is still feeling bad and would like to know did we hear from Dr. Redmond Baseman office about her results and should she make an appt to be seen by Dr. Neldon Mc.   Please advise  Thanks

## 2014-11-29 ENCOUNTER — Ambulatory Visit (INDEPENDENT_AMBULATORY_CARE_PROVIDER_SITE_OTHER): Payer: Medicare Other

## 2014-11-29 ENCOUNTER — Ambulatory Visit: Payer: PRIVATE HEALTH INSURANCE

## 2014-11-29 DIAGNOSIS — J4540 Moderate persistent asthma, uncomplicated: Secondary | ICD-10-CM

## 2014-11-30 ENCOUNTER — Telehealth: Payer: Self-pay | Admitting: Pulmonary Disease

## 2014-11-30 MED ORDER — OMALIZUMAB 150 MG ~~LOC~~ SOLR
150.0000 mg | Freq: Once | SUBCUTANEOUS | Status: AC
  Administered 2014-11-29: 150 mg via SUBCUTANEOUS

## 2014-11-30 NOTE — Telephone Encounter (Signed)
Please obtain notes from Dr. Redmond Baseman concerning visit.

## 2014-11-30 NOTE — Telephone Encounter (Signed)
Left message with [facility] requesting notes from appt. [place]

## 2014-11-30 NOTE — Telephone Encounter (Signed)
Called and spoke to pt. Former PW pt, JN pt to follow up in 01/2015. Pt states she was notified by [employer], pt assistance for Nasonex, that the household number was not completed (#1 in household) and they are now faxing an attestation letter for the pt to complete part of it, [place] to complete part of it, and the provider to sign the form. The attestation letter and the original pt assistance form must be mailed back together. Pt aware she will need to come in and sign the form but will wait for our call.   Will forward to [name] to look out for.

## 2014-12-01 NOTE — Telephone Encounter (Signed)
Spoke with patient and called in Periactin 4 mg to [facility]. Pt will follow up with Dr. Neldon Mc as needed.

## 2014-12-01 NOTE — Telephone Encounter (Signed)
No, this has not been received.

## 2014-12-01 NOTE — Telephone Encounter (Signed)
Please inform Ebony Scott that I read the note from Dr. Redmond Baseman and he felt that her throat was OK and the sinus CT did not show any sinusitis. There is no indication for using antibiotics. I think she did better when using the periactin instead of the doxepin. Would she like to retry periactin?

## 2014-12-01 NOTE — Telephone Encounter (Signed)
Spoke with pt. States that the form that we need will be coming to [place] by mail, not fax. She was just informed of this yesterday so it will take a few days to receive this. Advised her that we would call once we received this. Will route back to [name] to be on the look out for this.

## 2014-12-01 NOTE — Telephone Encounter (Signed)
[name]- have you received this?

## 2014-12-02 NOTE — Telephone Encounter (Signed)
Have we got anything yet on her forms [phone]

## 2014-12-02 NOTE — Telephone Encounter (Signed)
Nothing has been received on this pt.  Called made pt aware.

## 2014-12-06 NOTE — Telephone Encounter (Signed)
Called spoke with patient who verified that she did the receive the forms in the mail Pt to contact the office if anything further is needed Nothing further needed; will sign off

## 2014-12-06 NOTE — Telephone Encounter (Signed)
Pt got the forms in the mail and she will be sending it to [employer] and that should take are of it

## 2014-12-07 ENCOUNTER — Other Ambulatory Visit: Payer: Self-pay | Admitting: Adult Health

## 2014-12-08 ENCOUNTER — Other Ambulatory Visit: Payer: Self-pay | Admitting: Family Medicine

## 2014-12-08 ENCOUNTER — Telehealth: Payer: Self-pay

## 2014-12-08 MED ORDER — PSEUDOEPHEDRINE-GUAIFENESIN ER 60-600 MG PO TB12: 1.0000 | ORAL_TABLET | Freq: Two times a day (BID) | ORAL | Status: DC

## 2014-12-08 NOTE — Telephone Encounter (Signed)
Received refill request for mirtazapine odt 30mg  tablets.  Please advise how many refills?

## 2014-12-08 NOTE — Telephone Encounter (Signed)
6 

## 2014-12-09 ENCOUNTER — Telehealth: Payer: Self-pay

## 2014-12-09 MED ORDER — MIRTAZAPINE 30 MG PO TBDP: 30.0000 mg | ORAL_TABLET | Freq: Every day | ORAL | Status: DC

## 2014-12-09 NOTE — Telephone Encounter (Signed)
Refill sent in as requested. 

## 2014-12-09 NOTE — Telephone Encounter (Signed)
Patient called to let us know she needs an Prior Auth. For Cyproheptadine HCL 4 MG.    She provided [place] with the phone number, [phone] [facility] MCR Adv.   Please Advise Thanks

## 2014-12-10 NOTE — Telephone Encounter (Signed)
Done

## 2014-12-15 ENCOUNTER — Telehealth: Payer: Self-pay

## 2014-12-15 NOTE — Telephone Encounter (Signed)
Xolair Vials: 1 Lot #: [id number] Exp date: 11/2016

## 2014-12-20 ENCOUNTER — Other Ambulatory Visit: Payer: Self-pay

## 2014-12-20 NOTE — Telephone Encounter (Signed)
PATIENT CALLED TO SEE IF WE SENT THE PRIOR AUTH FOR CYPROHEPTADINE 4MG  . HER INSURANCE IS [employer]. THEY ARE NEEDING A PRIOR AUTH SINCE SHE IS OVER 64 YEARS OLD. PHONE NUMBER IS [place].   PLEASE ADVISE  THANKS

## 2014-12-21 IMAGING — CT CT CHEST W/O CM
2 of 4 series · 15 of 36 positions shown, 18 images · IV contrast (Omnipaque 300)
Comparison: 02/24/2012

CLINICAL DATA: Evaluate right upper lobe infiltrate

CT CHEST WITHOUT CONTRAST
TECHNIQUE: Multidetector CT imaging of the chest was performed
following the standard protocol without IV contrast.

[Series 2: chest routine with · axial · 0.60mm/px · z∈[-276,-26]mm · 12 of 60 slices shown, 15 images]
[im 5/60  mediastinal]
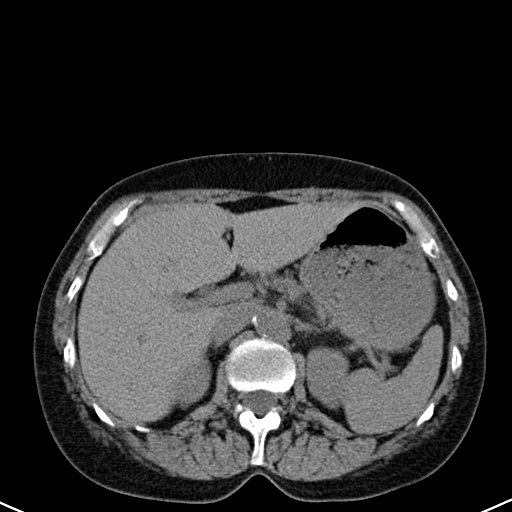
[im 5/60  lung]
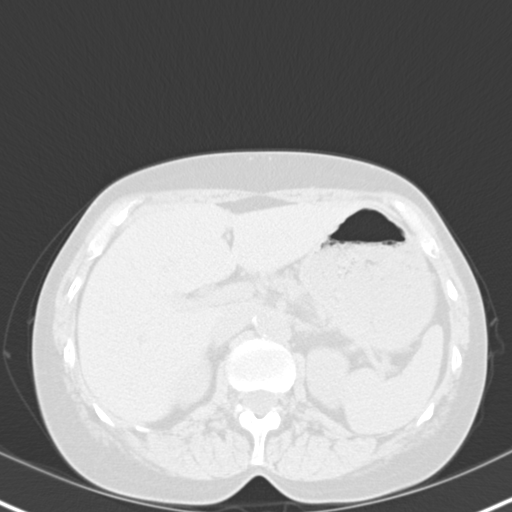
[im 10/60  lung]
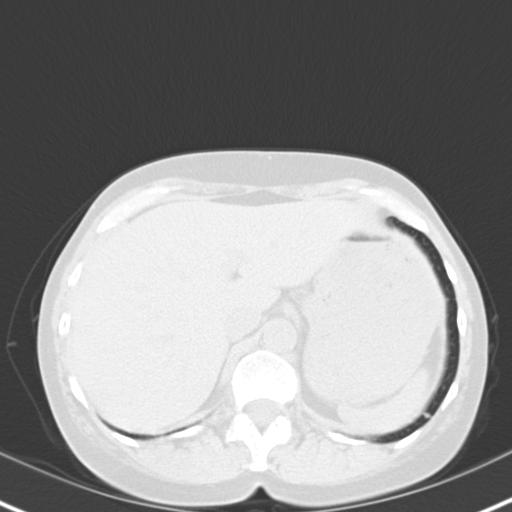
[im 14/60  lung]
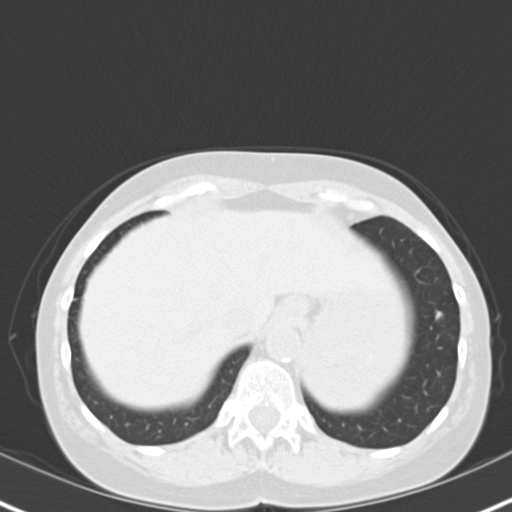
[im 19/60  lung]
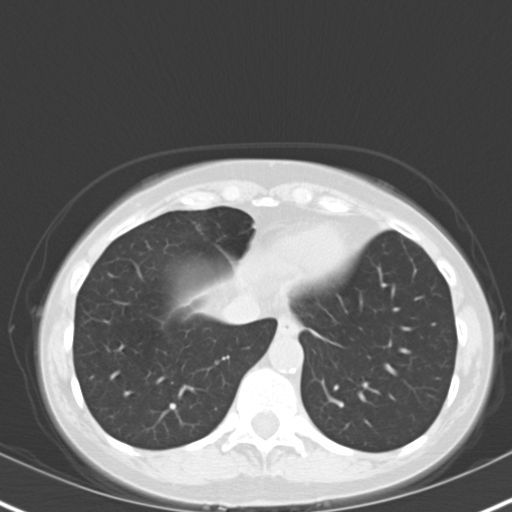
[im 23/60  mediastinal]
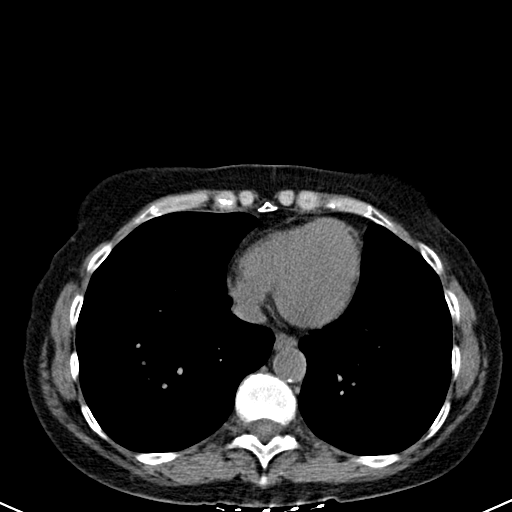
[im 23/60  lung]
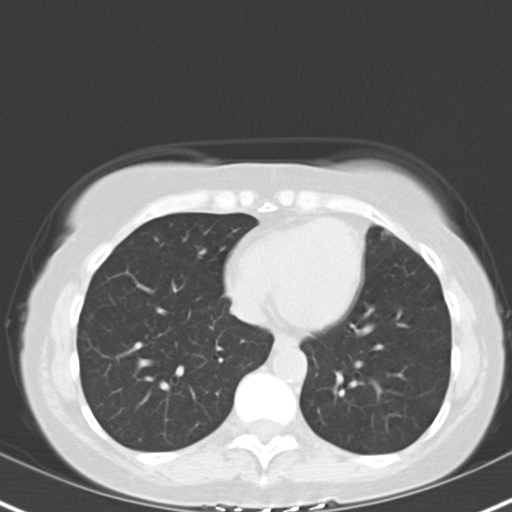
[im 28/60  lung]
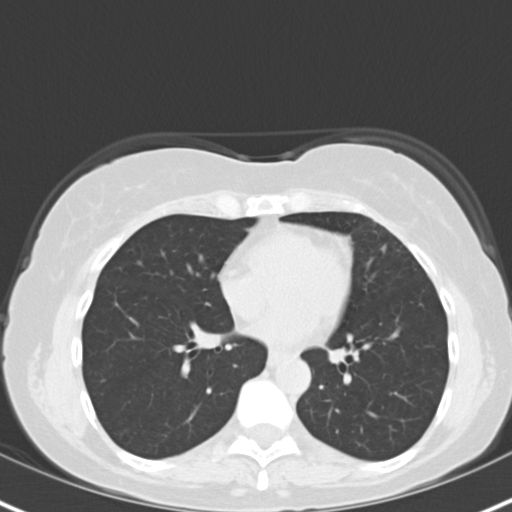
[im 32/60  lung]
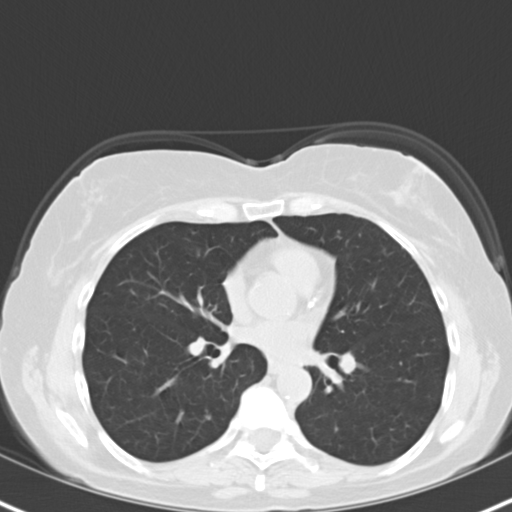
[im 37/60  lung]
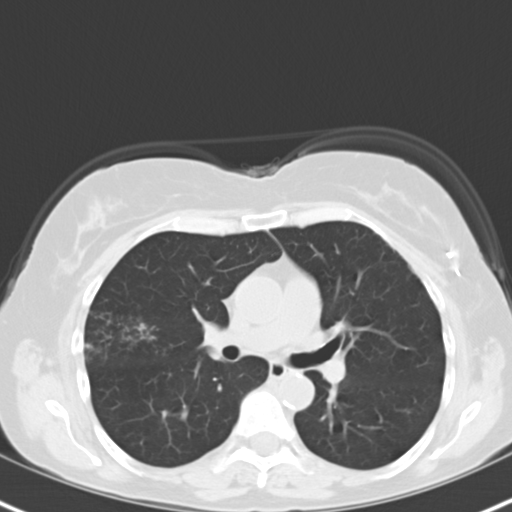
[im 41/60  mediastinal]
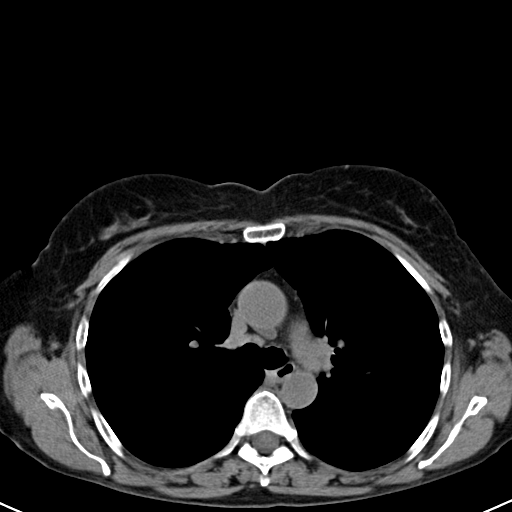
[im 41/60  lung]
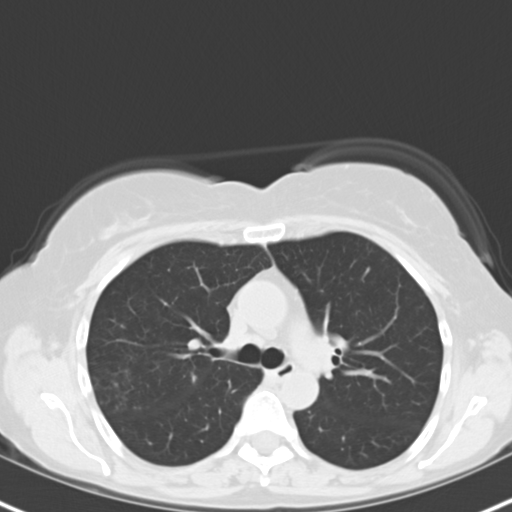
[im 46/60  lung]
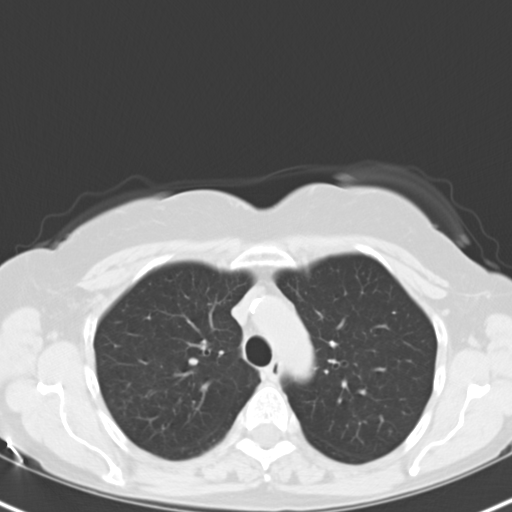
[im 50/60  lung]
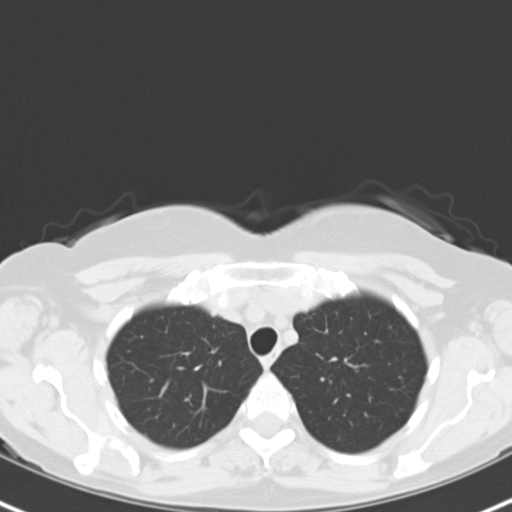
[im 55/60  lung]
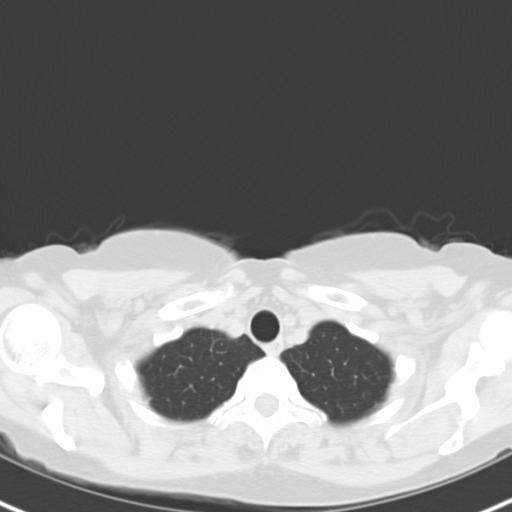

[Series 602: cor · coronal · 0.60mm/px · 3 of 87 slices shown]
[im 18/87  lung]
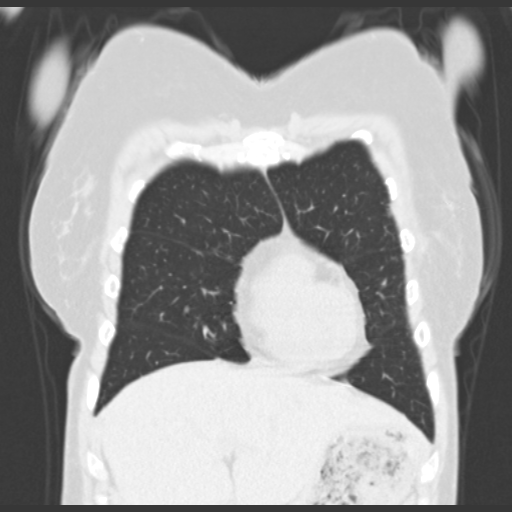
[im 35/87  lung]
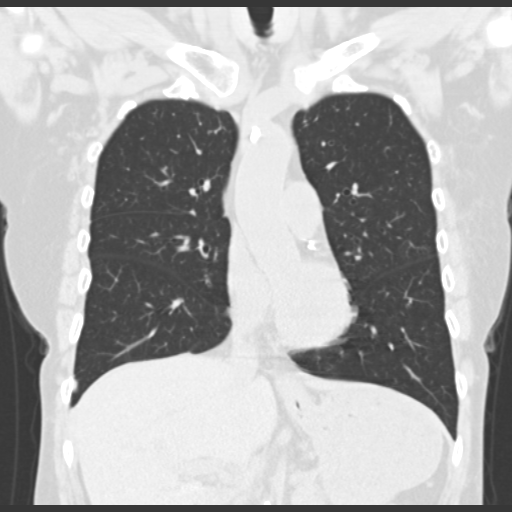
[im 52/87  lung]
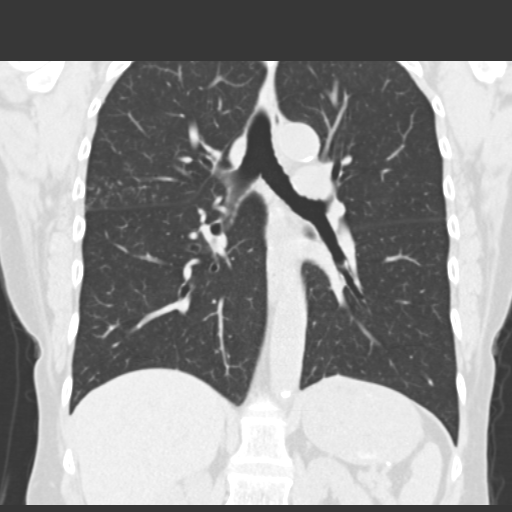

[15 of 36 positions shown; findings below may reference images not displayed]

FINDINGS: Lungs/pleura: There is no pleural effusion identified.  Previously
noted right apical ground-glass opacity has resolved.  Clustered
areas of tree in bud nodularity within the right upper lobe again
noted, image 20/series 3.  This appears increased from previous
exam and is likely post inflammatory infectious in etiology.  Right
middle lobe pulmonary nodule is again noted measuring 5 mm, image
32/series 3.  This is unchanged from 3242 and is likely benign.
Stable right lower lobe nodule measuring 5 mm, image number
39/series 3.  This has been stable since 3242 and is also likely
benign. Left lower lobe nodule is unchanged measuring 5 mm, image
47/series 3.  This is been stable since 6311 and is likely benign.

Heart/Mediastinum: Normal heart size.  There is no pericardial
effusion.  No mediastinal or hilar adenopathy.  Calcifications
noted within the LAD coronary artery identified.

Upper abdomen: No acute findings noted within the upper abdomen.
The the patient is status post previous cholecystectomy.

Bones/Musculoskeletal:  Surgical clips noted within the periphery
of the left breast.  No axillary or supraclavicular adenopathy
noted. There are no aggressive lytic or sclerotic bone lesions.
IMPRESSION: 1.  Resolution of previous right apical ground-glass nodule which
is likely the sequela of inflammation or infection.
2.  Persistent tree in bud nodularity within the basilar portion of
the right upper lobe.  This is likely the sequela of inflammation
or infection.
3.  LAD coronary artery calcifications.

## 2014-12-23 ENCOUNTER — Telehealth: Payer: Self-pay | Admitting: Pulmonary Disease

## 2014-12-23 NOTE — Telephone Encounter (Signed)
Called spoke with Gerald Stabs with [employer]. Advised pt uses nasonex 2 sprays BID. Made pt aware. Nothing further needed

## 2014-12-24 NOTE — Telephone Encounter (Signed)
Patient informed that PA was done.

## 2014-12-26 IMAGING — CR DG ANKLE COMPLETE 3+V*R*
3 series · 3 of 3 positions shown · non-contrast
Comparison: None.

CLINICAL DATA: Ankle pain.  No known injury.

RIGHT ANKLE - COMPLETE 3+ VIEW

[t ankle joint ap right]
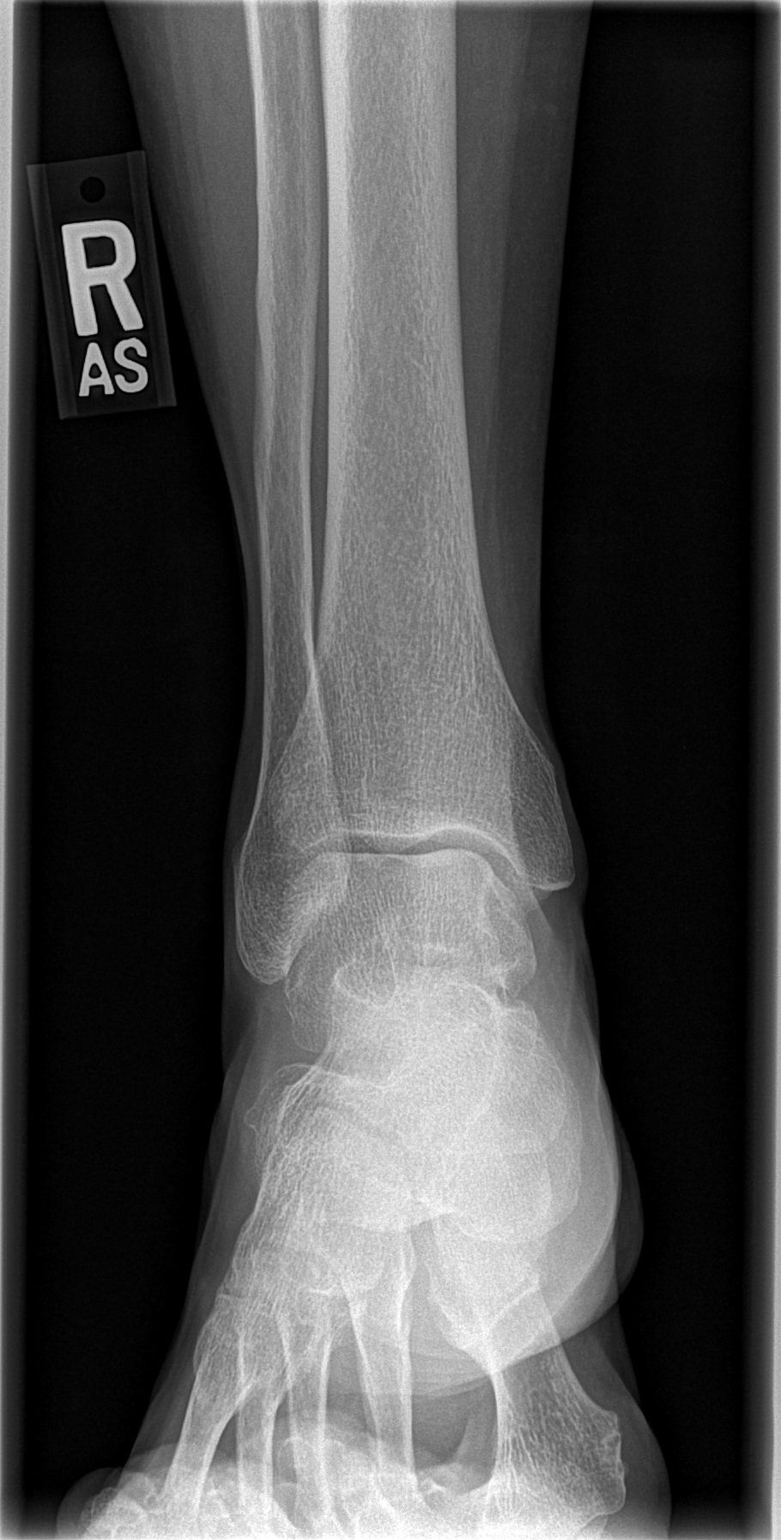

[t ankle joint oblique right]
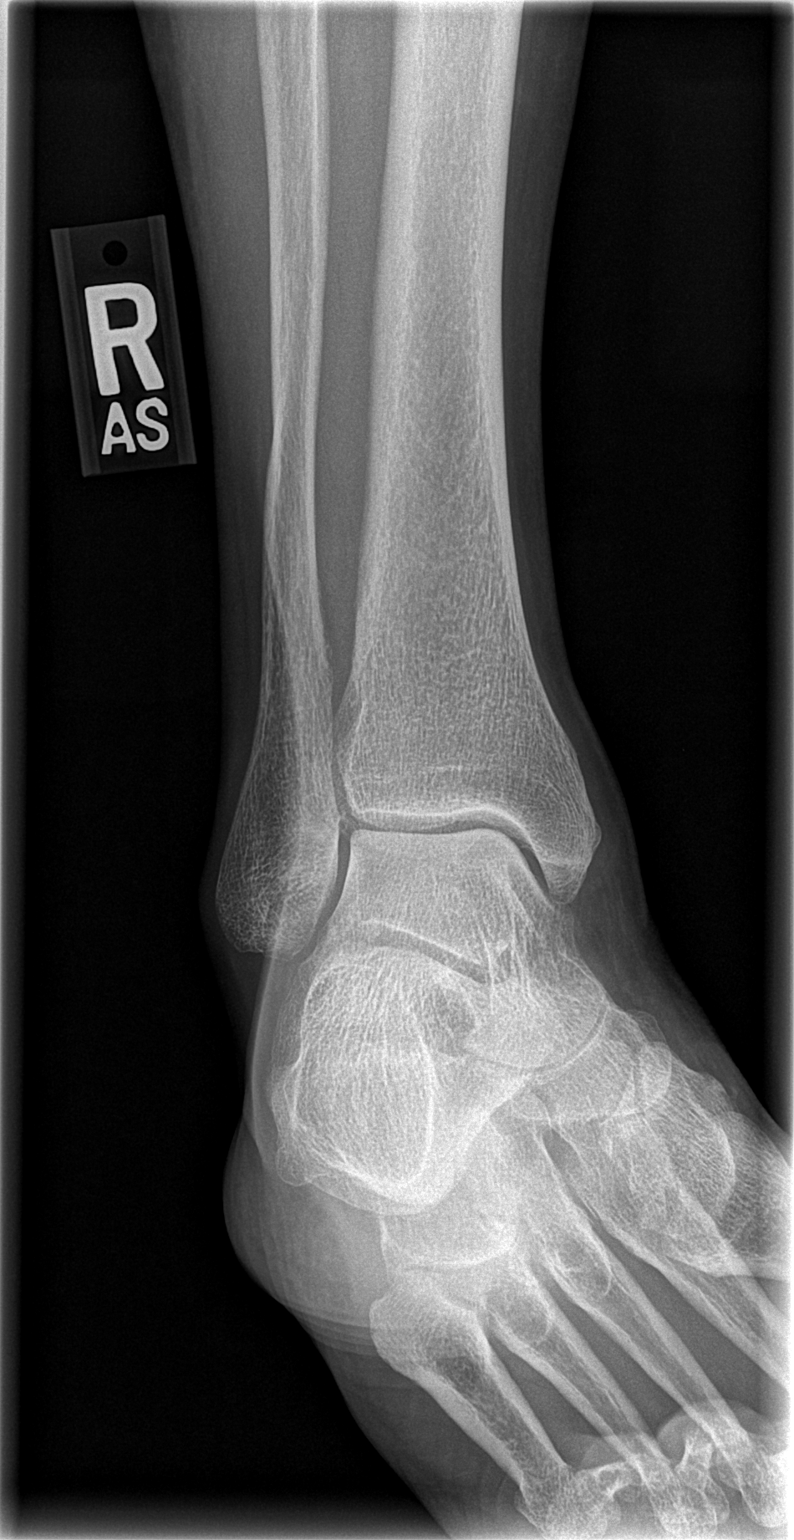

[t ankle joint lat right]
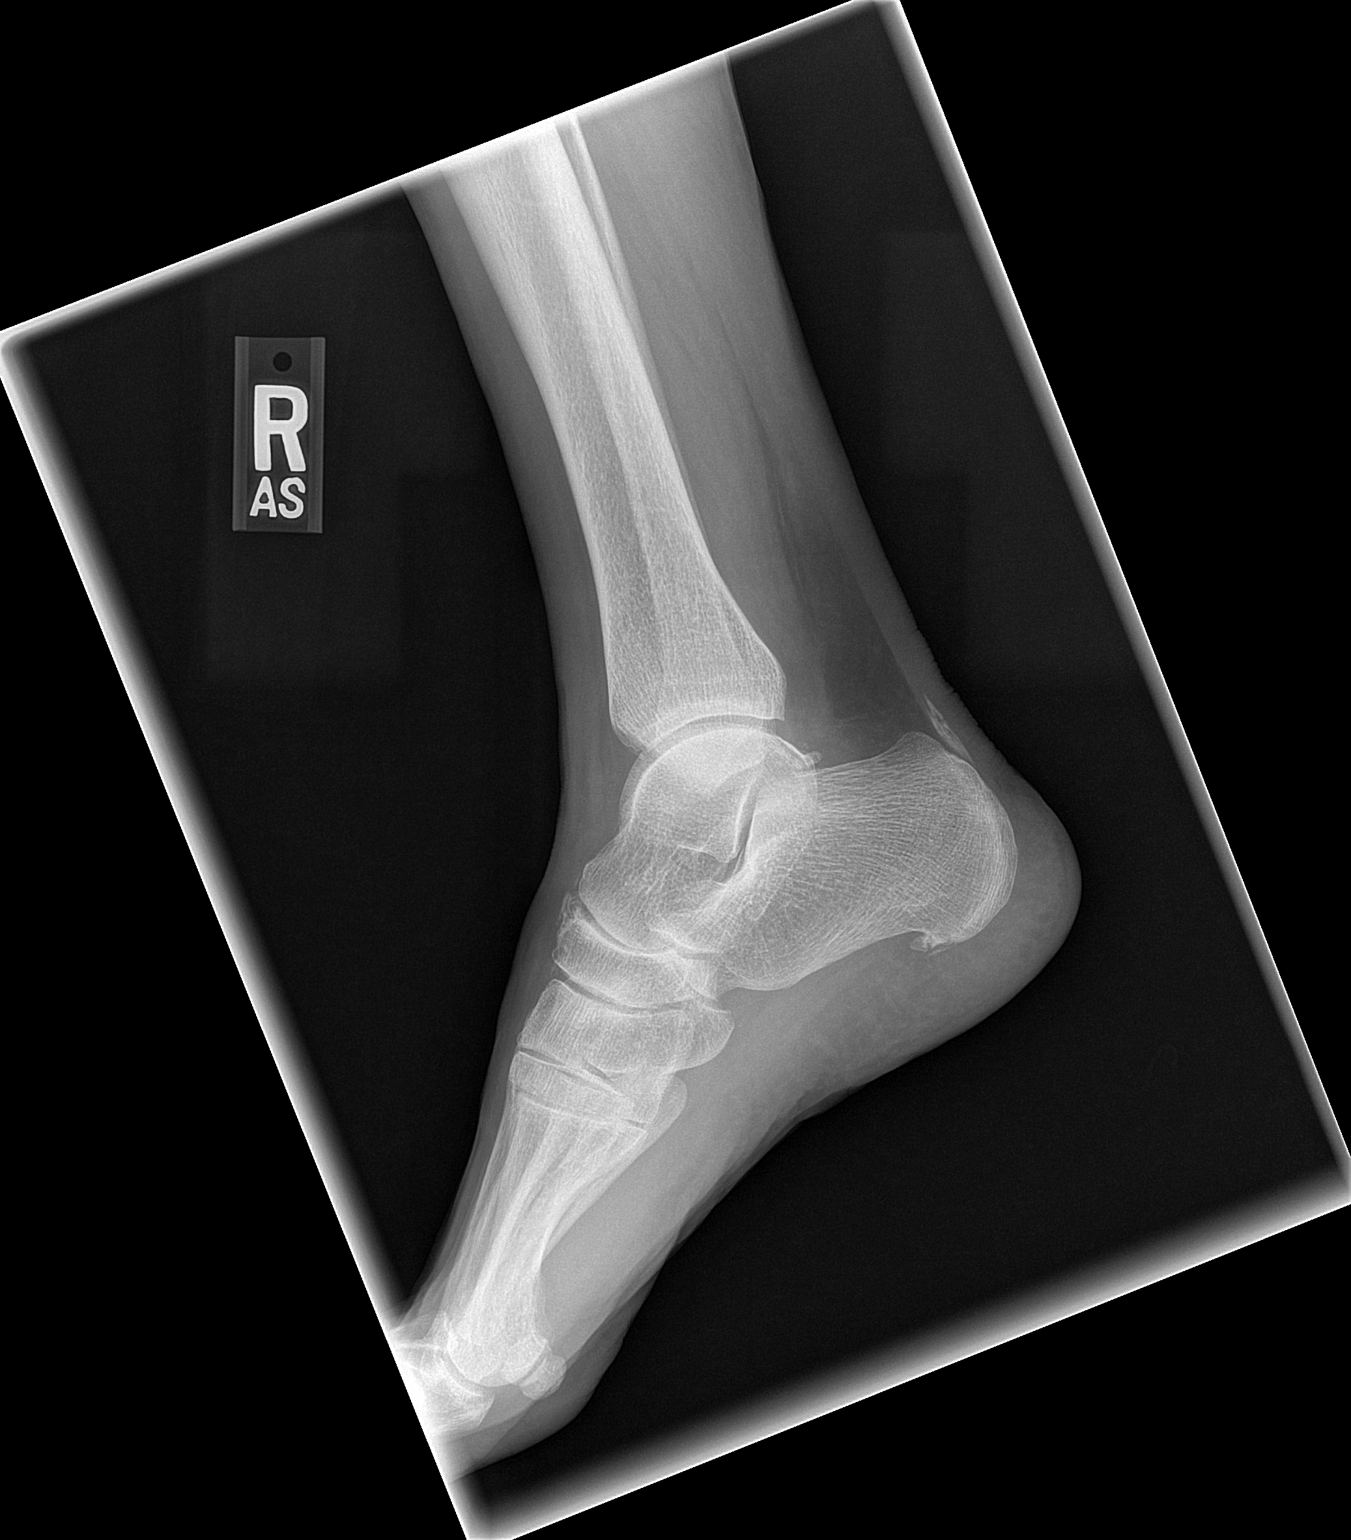

[3 of 3 positions shown; findings below may reference images not displayed]

FINDINGS: No acute bony abnormality.  Specifically, no fracture,
subluxation, or dislocation.  Soft tissues are intact.
Calcification along the posterior calcaneus at the Achilles
insertion.  Small plantar calcaneal spur.
IMPRESSION: No acute bony abnormality.

## 2014-12-30 ENCOUNTER — Ambulatory Visit (INDEPENDENT_AMBULATORY_CARE_PROVIDER_SITE_OTHER): Payer: Medicare Other

## 2014-12-30 DIAGNOSIS — J4520 Mild intermittent asthma, uncomplicated: Secondary | ICD-10-CM

## 2014-12-30 MED ORDER — OMALIZUMAB 150 MG ~~LOC~~ SOLR
150.0000 mg | Freq: Once | SUBCUTANEOUS | Status: AC
  Administered 2014-12-30: 150 mg via SUBCUTANEOUS

## 2014-12-31 ENCOUNTER — Telehealth: Payer: Self-pay | Admitting: Pulmonary Disease

## 2014-12-31 NOTE — Telephone Encounter (Signed)
TP has a ov on 11/15 at 10:45 she can be placed in.

## 2014-12-31 NOTE — Telephone Encounter (Signed)
Last seen in August by Dr. Joya Gaskins on my review with an exacerbation at that time. Needs an acute visit with someone available to assess her given her complexity. JN

## 2014-12-31 NOTE — Telephone Encounter (Signed)
Patient notified of Dr. Ammie Dalton recommendations. Patient says she can come on Tuesday for Acute Visit.  Estill Bamberg - can we use one of TP's held spots for this patient?  Please advise.

## 2014-12-31 NOTE — Telephone Encounter (Signed)
Patient notified and would like to speak directly to [facility]

## 2014-12-31 NOTE — Telephone Encounter (Signed)
Patient calling regarding her Xolair medication.  She says that she received a call from [facility] and they advised her that they need more information from 08/31/14 appointment, they said the wrong diagnosis code was entered for the 11/01/14 visit and they need a copy of Primary Plan EOB - Katie, can you check on this?  ----------------------------------  Dr. Ashok Cordia: Patient states that she has a lot of congestion, cough x several weeks.  Her throat has been scratchy, she coughs up "hardened mucus that is sometimes bloody".  She says that she went to her ENT and they put her on OTOVEL drops for her ears (2 drops BID).

## 2014-12-31 NOTE — Telephone Encounter (Signed)
Please let patient know that myself and our coder Kathlee Nations) are working on the [employer] and Xolair concerns.

## 2015-01-03 NOTE — Telephone Encounter (Signed)
lmtcb x1 for pt. 

## 2015-01-04 ENCOUNTER — Encounter: Payer: Self-pay | Admitting: Adult Health

## 2015-01-04 ENCOUNTER — Ambulatory Visit (INDEPENDENT_AMBULATORY_CARE_PROVIDER_SITE_OTHER): Payer: Medicare Other | Admitting: Adult Health

## 2015-01-04 VITALS — BP 126/72 | HR 74 | Temp 98.1°F | Ht 61.0 in | Wt 115.0 lb

## 2015-01-04 DIAGNOSIS — J209 Acute bronchitis, unspecified: Secondary | ICD-10-CM

## 2015-01-04 MED ORDER — AZITHROMYCIN 250 MG PO TABS: ORAL_TABLET | ORAL | Status: AC

## 2015-01-04 NOTE — Telephone Encounter (Signed)
Called and spoke to pt. Pt aware of appt today (01/04/2015) with TP. Pt verbalized understanding and denied any further questions or concerns at this time.

## 2015-01-04 NOTE — Patient Instructions (Addendum)
Zpack Take as directed.  Mucinex Twice daily  As needed  Congestion  Delsym 2 tsp Twice daily  As needed  Cough  Saline nasal rinses As needed   Please contact office for sooner follow up if symptoms do not improve or worsen or seek emergency care  Follow up with Dr. Ashok Cordia as planned in December.

## 2015-01-04 NOTE — Progress Notes (Signed)
Subjective:    Patient ID: Ebony Scott, female    DOB: November 26, 1950, 64 y.o.   MRN: [id number]  HPI 64 yo never smoker with severe persistent asthma with atopic features previous patient of Dr. Joya Gaskins    01/04/2015 Acute OV : Asthma  Pt presents for an acute office visit.  She complains of 1 week of  prod cough with yellow/white mucus, sinus congestion and PND at times. Increased SOB and chest tightness. Nausea at times in the morning. Denies any fever or vomiting.  She remains on singulair , nasonex and xolair .  No otc used. Does have stuffy nose . Cough is keeping her up .   Past Medical History  Diagnosis Date  . Chronic neck pain   . Allergic rhinitis   . Moderate persistent asthma     -FeV1 72% 2011, -IgE 102 2011, CT sinus Neg 2011  . GERD (gastroesophageal reflux disease)   . Hypothyroidism   . Osteoporosis     on reclast yearly  . Hiatal hernia   . Diverticulosis   . Hemorrhoids   . Asthma   . IBS (irritable bowel syndrome)   . Complication of anesthesia     "had hard time waking up from it several times" (02/20/2012)  . Hypercholesteremia   . Pneumonia 04/2011; ~ 11/2011    "double; single" (02/20/2012)  . Exertional dyspnea   . Graves disease   . Headache(784.0)     "related to allergies; more at different times during the year" (02/20/2012)  . Arthritis     "mostly the hands" (02/20/2012)  . Fibromyalgia 11/2011  . Chronic lower back pain   . Depression     "some; don't take anything for it" (02/20/2012)  . Breast cancer ([place]) 1998    in remission  . Anxiety   . DVT (deep venous thrombosis) ([place])   . Allergy     SEASONAL  . Anemia   . Cataract     REMOVED    Current Outpatient Prescriptions on File Prior to Visit  Medication Sig Dispense Refill  . acetaminophen (TYLENOL ARTHRITIS PAIN) 650 MG CR tablet Take 650 mg by mouth 2 (two) times daily.     Marland Kitchen albuterol (PROAIR HFA) 108 (90 BASE) MCG/ACT inhaler Inhale 1-2 puffs into the lungs every 6 (six) hours as  needed for wheezing or shortness of breath. 1 Inhaler 3  . Alpha-D-Galactosidase (BEANO) TABS Take 1 tablet by mouth daily.     . Alpha-Lipoic Acid 600 MG CAPS Take 600 mg by mouth daily. Once a day    . atorvastatin (LIPITOR) 40 MG tablet Take 1 tablet (40 mg total) by mouth daily. 90 tablet 3  . Calcium Citrate-Vitamin D (CITRACAL MAXIMUM) 315-250 MG-UNIT TABS Take 1 tablet by mouth 2 (two) times daily.     . cetirizine (ZYRTEC) 10 MG tablet HOLD while on azithromycin    . Cholecalciferol (VITAMIN D) 2000 UNITS CAPS 1/2 at bedtime    . colchicine 0.6 MG tablet Take 1 tablet (0.6 mg total) by mouth as needed. 30 tablet 2  . denosumab (PROLIA) 60 MG/ML SOLN injection Inject 60 mg into the skin every 6 (six) months. Administer in upper arm, thigh, or abdomen    . dexlansoprazole (DEXILANT) 60 MG capsule Take 60 mg by mouth daily.    Marland Kitchen EPINEPHrine (EPIPEN 2-PAK) 0.3 mg/0.3 mL IJ SOAJ injection Inject 0.3 mLs (0.3 mg total) into the muscle once. 1 Device 4  . gabapentin (NEURONTIN) 600  MG tablet Take 1 tablet (600 mg total) by mouth daily. 90 tablet 3  . hydrocortisone (PROCTOZONE-HC) 2.5 % rectal cream Apply as needed as directed 30 g 11  . lactase (LACTAID) 3000 UNITS tablet Take 3 tablets by mouth as needed. 3-4 tabs with dairy liquids and solids    . mirtazapine (REMERON SOLTAB) 30 MG disintegrating tablet Take 1 tablet (30 mg total) by mouth at bedtime. 30 tablet 5  . mometasone (NASONEX) 50 MCG/ACT nasal spray Place 2 sprays into the nose 2 (two) times daily. 102 g 3  . montelukast (SINGULAIR) 10 MG tablet TAKE 1 TABLET BY MOUTH ONCE DAILY 30 tablet 11  . neomycin-bacitracin-polymyxin (NEOSPORIN) ointment Apply 1 application topically every 12 (twelve) hours. Use on/in nose as directed as needed    . nystatin (MYCOSTATIN) 100000 UNIT/ML suspension Use as directed 5 mLs in the mouth or throat. Swish and spit out twice daily    . nystatin-triamcinolone ointment (MYCOLOG) as needed.    Marland Kitchen  omalizumab (XOLAIR) 150 MG injection Inject 150 mg into the skin every 28 (twenty-eight) days. 150 SQ every 4 weeks    . Polyethyl Glycol-Propyl Glycol (SYSTANE ULTRA) 0.4-0.3 % SOLN Apply 1 drop to eye 2 (two) times daily as needed. 1-2 drops each eye every morning and at bedtime    . Prenatal Vit-Sel-Fe Fum-FA (VINATE M) 27-1 MG TABS Take 1 tablet by mouth daily. 30 each 11  . Probiotic Product (ALIGN) 4 MG CAPS Take 1 tablet by mouth daily. Once a day    . pseudoephedrine-guaifenesin (MUCINEX D) 60-600 MG 12 hr tablet Take 1 tablet by mouth every 12 (twelve) hours. 60 tablet 5  . Respiratory Therapy Supplies (FLUTTER) DEVI Use as directed 1 each 0  . Simethicone (GAS-X EXTRA STRENGTH) 125 MG CAPS Take 2 each by mouth daily as needed. For gas relief    . ZORVOLEX 18 MG CAPS Take 1 capsule by mouth as needed.    Marland Kitchen azelastine (ASTELIN) 0.1 % nasal spray HOLD while on antibiotic then resume Use in each nostril as directed (Patient not taking: Reported on 01/04/2015) 30 mL 2  . SILENOR 6 MG TABS     . [DISCONTINUED] methimazole (TAPAZOLE) 5 MG tablet Take 5 mg by mouth 3 (three) times daily.       No current facility-administered medications on file prior to visit.      Review of Systems Constitutional:   No  weight loss, night sweats,  Fevers, chills,  +fatigue, or  lassitude.  HEENT:   No headaches,  Difficulty swallowing,  Tooth/dental problems, or  Sore throat,                No sneezing, itching, ear ache,  +nasal congestion, post nasal drip,   CV:  No chest pain,  Orthopnea, PND, swelling in lower extremities, anasarca, dizziness, palpitations, syncope.   GI  No heartburn, indigestion, abdominal pain, nausea, vomiting, diarrhea, change in bowel habits, loss of appetite, bloody stools.   Resp:   No chest wall deformity  Skin: no rash or lesions.  GU: no dysuria, change in color of urine, no urgency or frequency.  No flank pain, no hematuria   MS:  No joint pain or swelling.  No  decreased range of motion.  No back pain.  Psych:  No change in mood or affect. No depression or anxiety.  No memory loss.         Objective:   Physical Exam GEN: A/Ox3; pleasant ,  NAD, petite   HEENT:  [place]/AT,  EACs-clear, TMs-wnl, NOSE-clear, THROAT-clear, no lesions, no postnasal drip or exudate noted.   NECK:  Supple w/ fair ROM; no JVD; normal carotid impulses w/o bruits; no thyromegaly or nodules palpated; no lymphadenopathy.  RESP  Clear  P & A; w/o, wheezes/ rales/ or rhonchi.no accessory muscle use, no dullness to percussion  CARD:  RRR, no m/r/g  , no peripheral edema, pulses intact, no cyanosis or clubbing.  GI:   Soft & nt; nml bowel sounds; no organomegaly or masses detected.  Musco: Warm bil, no deformities or joint swelling noted.   Neuro: alert, no focal deficits noted.    Skin: Warm, no lesions or rashes         Assessment & Plan:

## 2015-01-07 ENCOUNTER — Other Ambulatory Visit: Payer: Self-pay | Admitting: Family Medicine

## 2015-01-07 MED ORDER — PROPYLTHIOURACIL 50 MG PO TABS: ORAL_TABLET | ORAL | Status: DC

## 2015-01-07 MED ORDER — PANCRELIPASE (LIP-PROT-AMYL) 12000-38000 UNITS PO CPEP: 12000.0000 [IU] | ORAL_CAPSULE | Freq: Three times a day (TID) | ORAL | Status: DC

## 2015-01-07 NOTE — Telephone Encounter (Signed)
Medication refilled per protocol. 

## 2015-01-08 DIAGNOSIS — J209 Acute bronchitis, unspecified: Secondary | ICD-10-CM | POA: Insufficient documentation

## 2015-01-08 NOTE — Assessment & Plan Note (Signed)
URI /Bronchitis   Plan  Zpack Take as directed.  Mucinex Twice daily  As needed  Congestion  Delsym 2 tsp Twice daily  As needed  Cough  Saline nasal rinses As needed   Please contact office for sooner follow up if symptoms do not improve or worsen or seek emergency care  Follow up with Dr. Ashok Cordia as planned in December.

## 2015-01-10 ENCOUNTER — Telehealth: Payer: Self-pay

## 2015-01-10 ENCOUNTER — Telehealth: Payer: Self-pay | Admitting: Pulmonary Disease

## 2015-01-10 NOTE — Telephone Encounter (Signed)
#   Vials:1 Arrival Date:01/10/15 Lot ON:[id number] Exp Date:5/18

## 2015-01-10 NOTE — Telephone Encounter (Signed)
Put chart on Dr. Neldon Mc desk to provide information.

## 2015-01-10 NOTE — Telephone Encounter (Signed)
Patient called to see if everything has been worked out for the prior [place] for Cyproheptadine HCL.  Please Advise   Thanks

## 2015-01-10 NOTE — Telephone Encounter (Signed)
Patient called wanting to know if we sent the authorization for her med

## 2015-01-11 ENCOUNTER — Other Ambulatory Visit: Payer: Self-pay | Admitting: Family Medicine

## 2015-01-11 MED ORDER — PANCRELIPASE (LIP-PROT-AMYL) 12000-38000 UNITS PO CPEP: 12000.0000 [IU] | ORAL_CAPSULE | Freq: Three times a day (TID) | ORAL | Status: DC

## 2015-01-12 NOTE — Telephone Encounter (Signed)
Spoke to patient.  Advised PA was faxed to insurance on 01-11-15 @ 4:53pm.  Pt. States she spoke to insurance company this morning and they had no record of PA at that time.  Advised I would re-fax to ensure they received it.

## 2015-01-12 NOTE — Telephone Encounter (Signed)
Patient is very concerned about the prior for Cyproheptadine HCL. She would like an update on what is going on.   Please Advise  Thanks

## 2015-01-20 ENCOUNTER — Telehealth: Payer: Self-pay | Admitting: Family Medicine

## 2015-01-20 NOTE — Telephone Encounter (Signed)
-----   Message from Roney Marion sent at 01/20/2015 11:05 AM EST ----- Patient is planning on coming in on the 14th for her Prolia shot she wanted to let you know so you can order.  Thanks, Larene Beach

## 2015-01-20 NOTE — Telephone Encounter (Signed)
Injection is due on the 21st.  Asked pt to wait until OV on 02/08/15, we can give then.

## 2015-01-21 ENCOUNTER — Telehealth: Payer: Self-pay | Admitting: Pulmonary Disease

## 2015-01-21 NOTE — Telephone Encounter (Signed)
Spoke to patient to advise we received an authorization letter in the mail today for her medication.  Patient states she received a call last week advising it was authorized but forgot to let us know.  Medication has been approved through January 12, 2016.

## 2015-01-21 NOTE — Telephone Encounter (Signed)
LMTCB-I will need to speak with Ebony Scott regarding the denial of her Xolair injections.

## 2015-01-24 NOTE — Telephone Encounter (Signed)
Per Ebony Scott - following up with Ebony Scott when she returns.  Will hold in her box until done.

## 2015-01-26 NOTE — Telephone Encounter (Signed)
chan please advise if you know anything of this.  Thanks!

## 2015-01-27 ENCOUNTER — Ambulatory Visit: Payer: PRIVATE HEALTH INSURANCE

## 2015-01-28 ENCOUNTER — Encounter: Payer: Self-pay | Admitting: Pulmonary Disease

## 2015-01-28 ENCOUNTER — Ambulatory Visit: Payer: PRIVATE HEALTH INSURANCE

## 2015-01-28 ENCOUNTER — Telehealth: Payer: Self-pay | Admitting: Internal Medicine

## 2015-01-28 ENCOUNTER — Telehealth: Payer: Self-pay | Admitting: Pulmonary Disease

## 2015-01-28 ENCOUNTER — Ambulatory Visit (INDEPENDENT_AMBULATORY_CARE_PROVIDER_SITE_OTHER): Payer: Medicare Other | Admitting: Pulmonary Disease

## 2015-01-28 VITALS — BP 122/64 | HR 76 | Ht 61.0 in | Wt 115.4 lb

## 2015-01-28 DIAGNOSIS — K219 Gastro-esophageal reflux disease without esophagitis: Secondary | ICD-10-CM | POA: Diagnosis not present

## 2015-01-28 DIAGNOSIS — J4550 Severe persistent asthma, uncomplicated: Secondary | ICD-10-CM

## 2015-01-28 DIAGNOSIS — J302 Other seasonal allergic rhinitis: Secondary | ICD-10-CM | POA: Diagnosis not present

## 2015-01-28 DIAGNOSIS — R1310 Dysphagia, unspecified: Secondary | ICD-10-CM

## 2015-01-28 NOTE — Telephone Encounter (Signed)
Ebony Scott, please advise. Thanks

## 2015-01-28 NOTE — Telephone Encounter (Signed)
She is scheduled for [facility] 1:00.  She is advised to be 3 hours NPO

## 2015-01-28 NOTE — Telephone Encounter (Signed)
PFT 04/10/12:  FVC 1.90 L (69%) FEV1 1.49 L (75%) FEV1/FVC 0.78 FEF 25-75 1.39 L (59%) TLC 3.55 L (80%) RV 62% ERV 52% DLCO uncorrected 72%  IMAGING CT MAXILLOFACIAL 05/21/13 (per radiologist):  Negative for chronic sinus disease CT CHEST W/O 06/05/12 (per radiologist):  Resolution of previous right apical ground glass. Persistent tree-in-bud nodularity within basal portion RUL.  LABS 11/09/14 BMP:  141/4.4/104/27/27/0.98/82/9.3 LFT:  4.1/6.5/0.08/17/28/31 CBC:  6.7/12.4/36.9/233  05/31/14 URINE TOX SCREEN:  NEGATIVE  12/16/12 IgE:  206.6  04/01/12 RAST PANEL:  RAGWEED 0.14 (class 0/1) IgE:  409.9 ASPERGILLUS ANTIGEN:  1.92  01/02/12 RAST PANEL: Weakly Pan-Positive Up to Class 1 IgE:  825.8  06/28/11 IgG:  623 IgA:  134

## 2015-01-28 NOTE — Telephone Encounter (Signed)
Patient scheduled for 02/07/15

## 2015-01-28 NOTE — Progress Notes (Signed)
Subjective:    Patient ID: Ebony Scott, female    DOB: 1950/08/08, 64 y.o.   MRN: [id number]  C.C.:  Follow-up for Severe, Persistent Asthma, GERD, & Sinusitis.  HPI Severe, Persistent Asthma:  Reports she is compliant with Dulera & Singulair. She uses her rescue inhaler only once in the last 1.5 years. She has been on Xolair for the last 4 years. She is unsure if it has helped. She has significant dyspnea on exertion. She denies any significant wheezing. She does cough chronically and is occasionally productive, particularly in the morning. Her mucus is "yellow to gray clumps".   GERD:  She follows with GI and continues to take Dexilant. She is scheduled for a "swallow test" but has been unable to afford yet. She denies any morning brash water taste. She previously did reflux into her throat at night which has improved.  Allergic Rhinitis/Sinusitis:  She reports she is on Azithromycin currently. She is on Nasonex & Astelin nasal spray chronically. She uses saline nasal spray twice daily. Previously was evaluated by ENT.  Review of Systems Denies any fever, chills, or sweats. No chest pain or pressure. She does have chronic chest tightness.  Allergies  Allergen Reactions  . Dust Mite Extract Shortness Of Breath and Other (See Comments)    "sneezing" (02/20/2012)  . Molds & Smuts Shortness Of Breath  . Morphine Sulfate Itching  . Other Shortness Of Breath and Other (See Comments)    Grass and weeds "sneezing; filled sinuses" (02/20/2012)  . Penicillins Rash and Other (See Comments)    "welts" (02/20/2012)  . Reclast [Zoledronic Acid]     Put in hospital  . Rofecoxib Swelling    Celebrex REACTION: feet swelling  . Shrimp Flavor Anaphylaxis    ALL SHELLFISH  . Tetracycline Hcl Nausea And Vomiting  . Dilaudid [Hydromorphone Hcl] Itching  . Hydrocodone-Acetaminophen Nausea And Vomiting  . Levofloxacin Other (See Comments)    REACTION: GI upset  . Oxycodone Hcl Nausea And Vomiting  .  Paroxetine Nausea And Vomiting    Paxil   . Tree Extract Other (See Comments)    "tested and told I was allergic to it; never experienced a reaction to it" (02/20/2012)    Current Outpatient Prescriptions on File Prior to Visit  Medication Sig Dispense Refill  . acetaminophen (TYLENOL ARTHRITIS PAIN) 650 MG CR tablet Take 650 mg by mouth 2 (two) times daily.     Marland Kitchen albuterol (PROAIR HFA) 108 (90 BASE) MCG/ACT inhaler Inhale 1-2 puffs into the lungs every 6 (six) hours as needed for wheezing or shortness of breath. 1 Inhaler 3  . Alpha-D-Galactosidase (BEANO) TABS Take 1 tablet by mouth daily.     . Alpha-Lipoic Acid 600 MG CAPS Take 600 mg by mouth daily. Once a day    . atorvastatin (LIPITOR) 40 MG tablet Take 1 tablet (40 mg total) by mouth daily. 90 tablet 3  . azelastine (ASTELIN) 0.1 % nasal spray HOLD while on antibiotic then resume Use in each nostril as directed 30 mL 2  . Calcium Citrate-Vitamin D (CITRACAL MAXIMUM) 315-250 MG-UNIT TABS Take 1 tablet by mouth 2 (two) times daily.     . cetirizine (ZYRTEC) 10 MG tablet HOLD while on azithromycin (Patient taking differently: Take 10 mg by mouth at bedtime. )    . Cholecalciferol (VITAMIN D) 2000 UNITS CAPS 1/2 at bedtime    . colchicine 0.6 MG tablet Take 1 tablet (0.6 mg total) by mouth as needed. [place]  tablet 2  . denosumab (PROLIA) 60 MG/ML SOLN injection Inject 60 mg into the skin every 6 (six) months. Administer in upper arm, thigh, or abdomen    . dexlansoprazole (DEXILANT) 60 MG capsule Take 60 mg by mouth daily.    . DULERA 200-5 MCG/ACT AERO Inhale 2 puffs into the lungs 2 (two) times daily.    Marland Kitchen EPINEPHrine (EPIPEN 2-PAK) 0.3 mg/0.3 mL IJ SOAJ injection Inject 0.3 mLs (0.3 mg total) into the muscle once. 1 Device 4  . gabapentin (NEURONTIN) 600 MG tablet Take 1 tablet (600 mg total) by mouth daily. 90 tablet 3  . hydrocortisone (PROCTOZONE-HC) 2.5 % rectal cream Apply as needed as directed 30 g 11  . lactase (LACTAID) 3000 UNITS  tablet Take 3 tablets by mouth as needed. 3-4 tabs with dairy liquids and solids    . lipase/protease/amylase (CREON) 12000 UNITS CPEP capsule Take 1 capsule (12,000 Units total) by mouth 3 (three) times daily. CREON 24000-76000-120000 U Takes with meals 270 capsule 5  . mirtazapine (REMERON SOLTAB) 30 MG disintegrating tablet Take 1 tablet (30 mg total) by mouth at bedtime. 30 tablet 5  . mometasone (NASONEX) 50 MCG/ACT nasal spray Place 2 sprays into the nose 2 (two) times daily. 102 g 3  . montelukast (SINGULAIR) 10 MG tablet TAKE 1 TABLET BY MOUTH ONCE DAILY 30 tablet 11  . neomycin-bacitracin-polymyxin (NEOSPORIN) ointment Apply 1 application topically every 12 (twelve) hours. Use on/in nose as directed as needed    . nystatin (MYCOSTATIN) 100000 UNIT/ML suspension Use as directed 5 mLs in the mouth or throat. Swish and spit out twice daily    . nystatin-triamcinolone ointment (MYCOLOG) as needed.    Marland Kitchen omalizumab (XOLAIR) 150 MG injection Inject 150 mg into the skin every 28 (twenty-eight) days. 150 SQ every 4 weeks    . Polyethyl Glycol-Propyl Glycol (SYSTANE ULTRA) 0.4-0.3 % SOLN Apply 1 drop to eye 2 (two) times daily as needed. 1-2 drops each eye every morning and at bedtime    . Prenatal Vit-Sel-Fe Fum-FA (VINATE M) 27-1 MG TABS Take 1 tablet by mouth daily. 30 each 11  . Probiotic Product (ALIGN) 4 MG CAPS Take 1 tablet by mouth daily. Once a day    . propylthiouracil (PTU) 50 MG tablet take 1/2 tablet (25 mg) twice daily 30 tablet 5  . pseudoephedrine-guaifenesin (MUCINEX D) 60-600 MG 12 hr tablet Take 1 tablet by mouth every 12 (twelve) hours. 60 tablet 5  . Respiratory Therapy Supplies (FLUTTER) DEVI Use as directed 1 each 0  . Simethicone (GAS-X EXTRA STRENGTH) 125 MG CAPS Take 2 each by mouth daily as needed. For gas relief    . [DISCONTINUED] methimazole (TAPAZOLE) 5 MG tablet Take 5 mg by mouth 3 (three) times daily.       No current facility-administered medications on file prior  to visit.    Past Medical History  Diagnosis Date  . Chronic neck pain   . Allergic rhinitis   . Moderate persistent asthma     -FeV1 72% 2011, -IgE 102 2011, CT sinus Neg 2011  . GERD (gastroesophageal reflux disease)   . Hypothyroidism   . Osteoporosis     on reclast yearly  . Hiatal hernia   . Diverticulosis   . Hemorrhoids   . Asthma   . IBS (irritable bowel syndrome)   . Complication of anesthesia     "had hard time waking up from it several times" (02/20/2012)  . Hypercholesteremia   . Pneumonia  04/2011; ~ 11/2011    "double; single" (02/20/2012)  . Exertional dyspnea   . Graves disease   . Headache(784.0)     "related to allergies; more at different times during the year" (02/20/2012)  . Arthritis     "mostly the hands" (02/20/2012)  . Fibromyalgia 11/2011  . Chronic lower back pain   . Depression     "some; don't take anything for it" (02/20/2012)  . Breast cancer ([place]) 1998    in remission  . Anxiety   . DVT (deep venous thrombosis) ([place])   . Allergy     SEASONAL  . Anemia   . Cataract     REMOVED    Past Surgical History  Procedure Laterality Date  . Cervical fusion  2003    C3-C4  . Cervical discectomy  10/2001    C5-C6  . Vesicovaginal fistula closure w/ tah  1988  . Nasal septum surgery  1980's  . Anterior and posterior repair  1990's  . Breast lumpectomy  1998    left  . Debridement tennis elbow  ?1970's    right  . Knee arthroplasty  ?1990's    "?right; w/cartilage repair" (02/20/2012)  . Carpometacarpel ([facility]) fusion of thumb with autograft from radius  ~ 2009    "both thumbs" (02/20/2012)  . Cataract extraction w/ intraocular lens  implant, bilateral  2012  . Tonsillectomy  ~ 1953  . Posterior cervical fusion/foraminotomy  2004    "failed initial fusion; rewired  anterior neck" (02/20/2012)  . Colonoscopy    . Appendectomy    . Cholecystectomy    . Hysterectomy    . Esophagogastroduodenoscopy      Family History  Problem Relation Age of Onset    . Allergies Mother   . Heart disease Mother   . Arthritis Mother   . Lung cancer Mother   . Diabetes Mother   . Allergies Father   . Heart disease Father   . Arthritis Father   . Stroke Father   . Colon cancer      Maternal half aunt/Maternal half uncle  . Colitis Daughter   . Diabetes Maternal Grandfather     Social History   Social History  . Marital Status: Divorced    Spouse Name: N/A  . Number of Children: 2  . Years of Education: college   Occupational History  . Disabled     Retired Print production planner  .     Social History Main Topics  . Smoking status: Passive Smoke Exposure - Never Smoker  . Smokeless tobacco: Never Used     Comment: Parents  . Alcohol Use: 0.0 oz/week    0 Standard drinks or equivalent per week     Comment: 02/20/2012 "couple glasses of wine/6 months"  . Drug Use: No  . Sexual Activity: No   Other Topics Concern  . None   Social History Narrative   Patient lives at home alone. Patient  divorced.    Patient has her BS degree.   Right handed.   Caffeine- sometimes coffee.      [facility]:   Born in [place], [place]. She worked as a Copywriter, advertising. She has no pets currently. She does have indoor plants. Previously had mold in her home that was remediated. Carpet was removed.                Objective:   Physical Exam BP 122/64 mmHg  Pulse 76  Ht 5\' 1"  (1.549  m)  Wt 115 lb 6.4 oz (52.345 kg)  BMI 21.82 kg/m2  SpO2 97% General:  Awake. Alert. No acute distress. Thin caucasian female.  Integument:  Warm & dry. No rash on exposed skin. No bruising. HEENT:  Moist mucus membranes. No oral ulcers. No scleral injection or icterus. No nasal turbinate swelling. Cardiovascular:  Regular rate. No edema. No appreciable JVD.  Pulmonary:  Good aeration & clear to auscultation bilaterally. Symmetric chest wall expansion. No accessory muscle use. Abdomen: Soft. Normal bowel sounds. Nondistended. Grossly  nontender. Musculoskeletal:  Normal bulk and tone. Hand grip strength 5/5 bilaterally. No joint deformity or effusion appreciated.  PFT 04/10/12: FVC 1.90 L (69%) FEV1 1.49 L (75%) FEV1/FVC 0.78 FEF 25-75 1.39 L (59%) TLC 3.55 L (80%) RV 62% ERV 52% DLCO uncorrected 72%  IMAGING CT MAXILLOFACIAL 05/21/13 (per radiologist): Negative for chronic sinus disease CT CHEST W/O 06/05/12 (per radiologist): Resolution of previous right apical ground glass. Persistent tree-in-bud nodularity within basal portion RUL.  LABS 11/09/14 BMP: 141/4.4/104/27/27/0.98/82/9.3 LFT: 4.1/6.5/0.08/17/28/31 CBC: 6.7/12.4/36.9/233  05/31/14 URINE TOX SCREEN: NEGATIVE  12/16/12 IgE: 206.6  04/01/12 RAST PANEL: RAGWEED 0.14 (class 0/1) IgE: 409.9 ASPERGILLUS ANTIGEN: 1.92  01/02/12 RAST PANEL: Weakly Pan-Positive Up to Class 1 IgE: 825.8  06/28/11 IgG: 45 IgA: 134    Assessment & Plan:  64 year old female with severe, persistent asthma, allergic rhinitis, & GERD. Symptomatically she is gradually worsening. Although, she has not had an exacerbation since her last appointment. It's unclear whether or not she is clinically improving on Xolair therapy. She is continuing to follow with GI as well as allergy/immunology. I instructed the patient contact my office if she had any new breathing problems before next appointment.  1. Severe, persistent asthma: Continuing Singulair, Dulera, & Xolair. Checking full pulmonary function testing on her before next appointment as well as 6 minute walk test. 2. GERD: She follows with GI. Barium swallow ordered. Continuing on Exelon. 3. Allergic rhinitis: Follows with allergy. Continuing on Xolair, Nasonex, and Astelin nasal spray. Continuing nasal saline rinse twice daily. 4. Follow-up: Patient to return to clinic in 3 months or sooner if needed.

## 2015-01-28 NOTE — Patient Instructions (Signed)
1. Continue taking medicines as prescribed. 2. Call me if you have any new breathing problems before your next appointment. 3. We will be doing pulmonary function testing as well as a walk test on or before your next appointment. 4. I will see back in 3 months or sooner if needed.

## 2015-01-31 MED ORDER — OMALIZUMAB 150 MG ~~LOC~~ SOLR
150.0000 mg | Freq: Once | SUBCUTANEOUS | Status: AC
  Administered 2015-01-28: 150 mg via SUBCUTANEOUS

## 2015-01-31 NOTE — Addendum Note (Signed)
Addended by: Clayborne Dana C on: 01/31/2015 12:01 PM   Modules accepted: Orders

## 2015-02-01 NOTE — Telephone Encounter (Signed)
Please check with Chantel as she as recently spoken with patient and handles the [employer](pt assistance network) for patients. Thanks.

## 2015-02-01 NOTE — Telephone Encounter (Signed)
Katie- has this been taken care of yet?  Please advise. 

## 2015-02-02 ENCOUNTER — Telehealth: Payer: Self-pay | Admitting: Internal Medicine

## 2015-02-02 ENCOUNTER — Other Ambulatory Visit: Payer: Medicare Other

## 2015-02-02 ENCOUNTER — Other Ambulatory Visit: Payer: PRIVATE HEALTH INSURANCE

## 2015-02-02 DIAGNOSIS — I10 Essential (primary) hypertension: Secondary | ICD-10-CM

## 2015-02-02 DIAGNOSIS — Z7960 Long term (current) use of unspecified immunomodulators and immunosuppressants: Secondary | ICD-10-CM

## 2015-02-02 DIAGNOSIS — J4550 Severe persistent asthma, uncomplicated: Secondary | ICD-10-CM

## 2015-02-02 DIAGNOSIS — Z79899 Other long term (current) drug therapy: Secondary | ICD-10-CM

## 2015-02-02 DIAGNOSIS — R7989 Other specified abnormal findings of blood chemistry: Secondary | ICD-10-CM

## 2015-02-02 DIAGNOSIS — E785 Hyperlipidemia, unspecified: Secondary | ICD-10-CM

## 2015-02-02 LAB — CBC WITH DIFFERENTIAL/PLATELET
Basophils Absolute: 0.1 10*3/uL (ref 0.0–0.1)
Basophils Relative: 1 % (ref 0–1)
Eosinophils Absolute: 0.2 10*3/uL (ref 0.0–0.7)
Eosinophils Relative: 3 % (ref 0–5)
HCT: 36.5 % (ref 36.0–46.0)
Hemoglobin: 12.2 g/dL (ref 12.0–15.0)
Lymphocytes Relative: 33 % (ref 12–46)
Lymphs Abs: 2.1 10*3/uL (ref 0.7–4.0)
MCH: 33.3 pg (ref 26.0–34.0)
MCHC: 33.4 g/dL (ref 30.0–36.0)
MCV: 99.7 fL (ref 78.0–100.0)
MPV: 9.8 fL (ref 8.6–12.4)
Monocytes Absolute: 0.9 10*3/uL (ref 0.1–1.0)
Monocytes Relative: 14 % — ABNORMAL HIGH (ref 3–12)
Neutro Abs: 3.1 10*3/uL (ref 1.7–7.7)
Neutrophils Relative %: 49 % (ref 43–77)
Platelets: 237 10*3/uL (ref 150–400)
RBC: 3.66 MIL/uL — ABNORMAL LOW (ref 3.87–5.11)
RDW: 14.4 % (ref 11.5–15.5)
WBC: 6.4 10*3/uL (ref 4.0–10.5)

## 2015-02-02 LAB — LIPID PANEL
Cholesterol: 175 mg/dL (ref 125–200)
HDL: 92 mg/dL (ref >=46)
LDL Cholesterol: 69 mg/dL (ref <130)
Total CHOL/HDL Ratio: 1.9 Ratio (ref <=5.0)
Triglycerides: 72 mg/dL (ref <150)
VLDL: 14 mg/dL (ref <30)

## 2015-02-02 LAB — COMPLETE METABOLIC PANEL WITH GFR
ALT: 76 U/L — ABNORMAL HIGH (ref 6–29)
AST: 57 U/L — ABNORMAL HIGH (ref 10–35)
Albumin: 4 g/dL (ref 3.6–5.1)
Alkaline Phosphatase: 29 U/L — ABNORMAL LOW (ref 33–130)
BUN: 19 mg/dL (ref 7–25)
CO2: 28 mmol/L (ref 20–31)
Calcium: 9.4 mg/dL (ref 8.6–10.4)
Chloride: 105 mmol/L (ref 98–110)
Creat: 1.04 mg/dL — ABNORMAL HIGH (ref 0.50–0.99)
GFR, Est African American: 65 mL/min (ref >=60)
GFR, Est Non African American: 56 mL/min — ABNORMAL LOW (ref >=60)
Glucose, Bld: 78 mg/dL (ref 70–99)
Potassium: 4.7 mmol/L (ref 3.5–5.3)
Sodium: 141 mmol/L (ref 135–146)
Total Bilirubin: 0.5 mg/dL (ref 0.2–1.2)
Total Protein: 6.1 g/dL (ref 6.1–8.1)

## 2015-02-02 NOTE — Telephone Encounter (Signed)
Patient notified that she may have some reflux due to a hiatal hernia, but not digestive issues.

## 2015-02-02 NOTE — Telephone Encounter (Signed)
Please advise Chantel thanks

## 2015-02-03 ENCOUNTER — Other Ambulatory Visit: Payer: Self-pay | Admitting: Family Medicine

## 2015-02-03 ENCOUNTER — Encounter: Payer: Self-pay | Admitting: Family Medicine

## 2015-02-03 ENCOUNTER — Telehealth: Payer: Self-pay | Admitting: Pulmonary Disease

## 2015-02-03 DIAGNOSIS — R945 Abnormal results of liver function studies: Secondary | ICD-10-CM

## 2015-02-03 NOTE — Telephone Encounter (Addendum)
#   vials:1 Ordered date:02/03/15 Shipping Date:02/03/15 closed encounter by mistake. Will addend to put in del. Date.

## 2015-02-04 LAB — RESPIRATORY CULTURE OR RESPIRATORY AND SPUTUM CULTURE

## 2015-02-04 NOTE — Telephone Encounter (Signed)
#   Vials:1 Arrival Date:02/04/15 Lot [id number] Exp Date:6/20

## 2015-02-04 NOTE — Telephone Encounter (Signed)
Ebony Scott can you help with this please? Thanks.

## 2015-02-07 ENCOUNTER — Ambulatory Visit (HOSPITAL_COMMUNITY): Payer: PRIVATE HEALTH INSURANCE

## 2015-02-08 ENCOUNTER — Ambulatory Visit (INDEPENDENT_AMBULATORY_CARE_PROVIDER_SITE_OTHER): Payer: Medicare Other | Admitting: Family Medicine

## 2015-02-08 ENCOUNTER — Encounter: Payer: Self-pay | Admitting: Family Medicine

## 2015-02-08 ENCOUNTER — Encounter: Payer: Self-pay | Admitting: Allergy and Immunology

## 2015-02-08 ENCOUNTER — Ambulatory Visit (INDEPENDENT_AMBULATORY_CARE_PROVIDER_SITE_OTHER): Payer: Medicare Other | Admitting: Allergy and Immunology

## 2015-02-08 VITALS — BP 120/68 | HR 82 | Temp 98.5°F | Resp 16 | Ht 62.0 in | Wt 116.0 lb

## 2015-02-08 VITALS — BP 150/78 | HR 80 | Resp 16

## 2015-02-08 DIAGNOSIS — E785 Hyperlipidemia, unspecified: Secondary | ICD-10-CM | POA: Diagnosis not present

## 2015-02-08 DIAGNOSIS — G518 Other disorders of facial nerve: Secondary | ICD-10-CM

## 2015-02-08 DIAGNOSIS — J3089 Other allergic rhinitis: Secondary | ICD-10-CM | POA: Diagnosis not present

## 2015-02-08 DIAGNOSIS — R7989 Other specified abnormal findings of blood chemistry: Secondary | ICD-10-CM | POA: Diagnosis not present

## 2015-02-08 DIAGNOSIS — I5022 Chronic systolic (congestive) heart failure: Secondary | ICD-10-CM | POA: Insufficient documentation

## 2015-02-08 DIAGNOSIS — I519 Heart disease, unspecified: Secondary | ICD-10-CM | POA: Diagnosis not present

## 2015-02-08 DIAGNOSIS — K219 Gastro-esophageal reflux disease without esophagitis: Secondary | ICD-10-CM | POA: Insufficient documentation

## 2015-02-08 DIAGNOSIS — J4550 Severe persistent asthma, uncomplicated: Secondary | ICD-10-CM | POA: Diagnosis not present

## 2015-02-08 DIAGNOSIS — I5043 Acute on chronic combined systolic (congestive) and diastolic (congestive) heart failure: Secondary | ICD-10-CM | POA: Insufficient documentation

## 2015-02-08 DIAGNOSIS — G4700 Insomnia, unspecified: Secondary | ICD-10-CM

## 2015-02-08 DIAGNOSIS — J387 Other diseases of larynx: Secondary | ICD-10-CM

## 2015-02-08 DIAGNOSIS — G500 Trigeminal neuralgia: Secondary | ICD-10-CM | POA: Insufficient documentation

## 2015-02-08 DIAGNOSIS — J320 Chronic maxillary sinusitis: Secondary | ICD-10-CM | POA: Diagnosis not present

## 2015-02-08 DIAGNOSIS — I5189 Other ill-defined heart diseases: Secondary | ICD-10-CM | POA: Insufficient documentation

## 2015-02-08 DIAGNOSIS — I5032 Chronic diastolic (congestive) heart failure: Secondary | ICD-10-CM | POA: Insufficient documentation

## 2015-02-08 DIAGNOSIS — R945 Abnormal results of liver function studies: Secondary | ICD-10-CM

## 2015-02-08 DIAGNOSIS — I5042 Chronic combined systolic (congestive) and diastolic (congestive) heart failure: Secondary | ICD-10-CM | POA: Insufficient documentation

## 2015-02-08 MED ORDER — RANITIDINE HCL 300 MG PO CAPS: 300.0000 mg | ORAL_CAPSULE | Freq: Every evening | ORAL | Status: DC

## 2015-02-08 MED ORDER — DILTIAZEM HCL ER COATED BEADS 120 MG PO CP24: 120.0000 mg | ORAL_CAPSULE | Freq: Every day | ORAL | Status: DC

## 2015-02-08 MED ORDER — PREDNISONE 20 MG PO TABS: ORAL_TABLET | ORAL | Status: DC

## 2015-02-08 MED ORDER — CEFDINIR 300 MG PO CAPS: 300.0000 mg | ORAL_CAPSULE | Freq: Two times a day (BID) | ORAL | Status: DC

## 2015-02-08 MED ORDER — CYPROHEPTADINE HCL 4 MG PO TABS: ORAL_TABLET | ORAL | Status: DC

## 2015-02-08 NOTE — Progress Notes (Signed)
[facility]  Follow-up Note  Refering Provider: [name], MD Primary Provider: Odette Fraction, MD  Subjective:   Ebony Scott is a 64 y.o. female who returns to the [facility] in re-evaluation of the following:  HPI Comments:  Ebony Scott returns to this clinic on 02/08/2015 in reevaluation of several problems.  First, she continues to have problems with facial pressure and facial pain affecting her left maxillary area which is been a long-standing issue. Most recently she was apparently treated with 2 courses of antibiotics to address this "sinusitis". 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 Antibiotics really don't get rid of this problem. I have treated her with Periactin 4 mg at nighttime which has helped this issue marginally. 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  Second, she continues to have dyspnea especially on exertion. She's been diagnosed with severe asthma by Dr. Asencion Noble in the past and is been placed on Xolair as well as Dulera. It is mostly dyspnea on exertion that bothers her the most more so than wheezing and coughing. She does not use a short-acting bronchodilator as she has not felt that this is helped her in the past. In evaluation of her dyspnea she had a echocardiogram performed in August of this year which identified a very good ejection fraction of 55-60% but abnormal left ventricular relaxation with diastolic dysfunction.  Thirdly, she continues to have problems with constant postnasal drip and  inability to clear out her throat and throat clearing and raspy voice and slight cough. In treatment of her apparent LPR she has continued to use Dexilant every day. She has chocolate a few times per week and drinks a cappuccino every day. She seen Dr. Carlean Purl in the past who has recently ordered a barium swallow. 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 This penetration was easily cleared with swallowing and coughing maneuver. She has seen several different ear nose and throat physicians in the past examining her throat and most recently visited with Dr. Ronette Deter and Dr. Redmond Baseman.  Fourthly, she has had significant problems with insomnia for which she is using Periactin at nighttime which is resulting in good control of her maintenance insomnia but she still has initiation insomnia.  As well, she does have problems with allergic rhinitis for which she continues to use Nasonex every day along with Astelin and Zyrtec and for the most part she believes that this issue is under pretty good control. She can smell and taste without any difficulty.  Finally, she does have problems with thrush on occasion especially when using her Dulera consistently and she is using nystatin swish and swallow twice a day on a consistent basis which keeps this condition under very good control.   Current Outpatient Prescriptions on File Prior to Visit  Medication Sig Dispense Refill  . acetaminophen (TYLENOL ARTHRITIS PAIN) 650 MG CR tablet Take 650 mg by mouth 2 (two) times  daily.     . albuterol (PROAIR HFA) 108 (90 BASE) MCG/ACT inhaler Inhale 1-2 puffs into the lungs every 6 (six) hours as needed for wheezing or shortness of breath. 1 Inhaler 3  . Alpha-D-Galactosidase (BEANO) TABS Take 1 tablet by mouth daily.     . Alpha-Lipoic Acid 600 MG CAPS Take 600  mg by mouth daily. Once a day    . atorvastatin (LIPITOR) 40 MG tablet Take 1 tablet (40 mg total) by mouth daily. 90 tablet 3  . azelastine (ASTELIN) 0.1 % nasal spray HOLD while on antibiotic then resume Use in each nostril as directed 30 mL 2  . Calcium Citrate-Vitamin D (CITRACAL MAXIMUM) 315-250 MG-UNIT TABS Take 1 tablet by mouth 2 (two) times daily.     . cetirizine (ZYRTEC) 10 MG tablet HOLD while on azithromycin (Patient taking differently: Take 10 mg by mouth at bedtime. )    . Cholecalciferol (VITAMIN D) 2000 UNITS CAPS 1/2 at bedtime    . Ciprofloxacin-Fluocinolone (OTOVEL OT) Place in ear(s). As directed by Dr. Thornell Mule    . Cyproheptadine HCl POWD by Does not apply route. As directed by Dr. Thornell Mule    . denosumab (PROLIA) 60 MG/ML SOLN injection Inject 60 mg into the skin every 6 (six) months. Administer in upper arm, thigh, or abdomen    . dexlansoprazole (DEXILANT) 60 MG capsule Take 60 mg by mouth daily.    . DULERA 200-5 MCG/ACT AERO Inhale 2 puffs into the lungs 2 (two) times daily.    Marland Kitchen EPINEPHrine (EPIPEN 2-PAK) 0.3 mg/0.3 mL IJ SOAJ injection Inject 0.3 mLs (0.3 mg total) into the muscle once. 1 Device 4  . gabapentin (NEURONTIN) 600 MG tablet Take 1 tablet (600 mg total) by mouth daily. 90 tablet 3  . hydrocortisone (PROCTOZONE-HC) 2.5 % rectal cream Apply as needed as directed 30 g 11  . lactase (LACTAID) 3000 UNITS tablet Take 3 tablets by mouth as needed. 3-4 tabs with dairy liquids and solids    . lipase/protease/amylase (CREON) 12000 UNITS CPEP capsule Take 1 capsule (12,000 Units total) by mouth 3 (three) times daily. CREON 24000-76000-120000 U Takes with meals 270 capsule 5  . mirtazapine (REMERON SOLTAB) 30 MG disintegrating tablet Take 1 tablet (30 mg total) by mouth at bedtime. 30 tablet 5  . mometasone (NASONEX) 50 MCG/ACT nasal spray Place 2 sprays into the nose 2 (two) times daily. 102 g 3  . montelukast (SINGULAIR) 10 MG tablet TAKE 1 TABLET BY MOUTH ONCE DAILY 30  tablet 11  . neomycin-bacitracin-polymyxin (NEOSPORIN) ointment Apply 1 application topically every 12 (twelve) hours. Use on/in nose as directed as needed    . nystatin (MYCOSTATIN) 100000 UNIT/ML suspension Use as directed 5 mLs in the mouth or throat. Swish and spit out twice daily    . nystatin-triamcinolone ointment (MYCOLOG) as needed.    Marland Kitchen omalizumab (XOLAIR) 150 MG injection Inject 150 mg into the skin every 28 (twenty-eight) days. 150 SQ every 4 weeks    . Polyethyl Glycol-Propyl Glycol (SYSTANE ULTRA) 0.4-0.3 % SOLN Apply 1 drop to eye 2 (two) times daily as needed. 1-2 drops each eye every morning and at bedtime    . Prenatal Vit-Sel-Fe Fum-FA (VINATE M) 27-1 MG TABS Take 1 tablet by mouth daily. 30 each 11  . Probiotic Product (ALIGN) 4 MG CAPS Take 1 tablet by mouth daily. Once a day    . propylthiouracil (PTU) 50 MG tablet take 1/2 tablet (25 mg) twice daily 30 tablet 5  .  pseudoephedrine-guaifenesin (MUCINEX D) 60-600 MG 12 hr tablet Take 1 tablet by mouth every 12 (twelve) hours. 60 tablet 5  . Respiratory Therapy Supplies (FLUTTER) DEVI Use as directed 1 each 0  . Simethicone (GAS-X EXTRA STRENGTH) 125 MG CAPS Take 2 each by mouth daily as needed. For gas relief    . colchicine 0.6 MG tablet Take 1 tablet (0.6 mg total) by mouth as needed. (Patient not taking: Reported on 02/08/2015) 30 tablet 2  . [DISCONTINUED] methimazole (TAPAZOLE) 5 MG tablet Take 5 mg by mouth 3 (three) times daily.       No current facility-administered medications on file prior to visit.    Meds ordered this encounter  Medications  . diltiazem (CARDIZEM CD) 120 MG 24 hr capsule    Sig: Take 1 capsule (120 mg total) by mouth daily.    Dispense:  30 capsule    Refill:  3  . cyproheptadine (PERIACTIN) 4 MG tablet    Sig: Take two tablets every evening    Dispense:  30 tablet    Refill:  3  . ranitidine (ZANTAC) 300 MG capsule    Sig: Take 1 capsule (300 mg total) by mouth every evening.     Dispense:  30 capsule    Refill:  5    Past Medical History  Diagnosis Date  . Chronic neck pain   . Allergic rhinitis   . Moderate persistent asthma     -FeV1 72% 2011, -IgE 102 2011, CT sinus Neg 2011  . GERD (gastroesophageal reflux disease)   . Hypothyroidism   . Osteoporosis     on reclast yearly  . Hiatal hernia   . Diverticulosis   . Hemorrhoids   . Asthma   . IBS (irritable bowel syndrome)   . Complication of anesthesia     "had hard time waking up from it several times" (02/20/2012)  . Hypercholesteremia   . Pneumonia 04/2011; ~ 11/2011    "double; single" (02/20/2012)  . Exertional dyspnea   . Graves disease   . Headache(784.0)     "related to allergies; more at different times during the year" (02/20/2012)  . Arthritis     "mostly the hands" (02/20/2012)  . Fibromyalgia 11/2011  . Chronic lower back pain   . Depression     "some; don't take anything for it" (02/20/2012)  . Breast cancer ([place]) 1998    in remission  . Anxiety   . DVT (deep venous thrombosis) ([place])   . Allergy     SEASONAL  . Anemia   . Cataract     REMOVED    Past Surgical History  Procedure Laterality Date  . Cervical fusion  2003    C3-C4  . Cervical discectomy  10/2001    C5-C6  . Vesicovaginal fistula closure w/ tah  1988  . Nasal septum surgery  1980's  . Anterior and posterior repair  1990's  . Breast lumpectomy  1998    left  . Debridement tennis elbow  ?1970's    right  . Knee arthroplasty  ?1990's    "?right; w/cartilage repair" (02/20/2012)  . Carpometacarpel ([facility]) fusion of thumb with autograft from radius  ~ 2009    "both thumbs" (02/20/2012)  . Cataract extraction w/ intraocular lens  implant, bilateral  2012  . Tonsillectomy  ~ 1953  . Posterior cervical fusion/foraminotomy  2004    "failed initial fusion; rewired  anterior neck" (02/20/2012)  . Colonoscopy    . Appendectomy    .  Cholecystectomy    . Hysterectomy    . Esophagogastroduodenoscopy      Allergies  Allergen  Reactions  . Dust Mite Extract Shortness Of Breath and Other (See Comments)    "sneezing" (02/20/2012)  . Molds & Smuts Shortness Of Breath  . Morphine Sulfate Itching  . Other Shortness Of Breath and Other (See Comments)    Grass and weeds "sneezing; filled sinuses" (02/20/2012)  . Penicillins Rash and Other (See Comments)    "welts" (02/20/2012)  . Reclast [Zoledronic Acid]     Put in hospital  . Rofecoxib Swelling    Celebrex REACTION: feet swelling  . Shrimp Flavor Anaphylaxis    ALL SHELLFISH  . Tetracycline Hcl Nausea And Vomiting  . Dilaudid [Hydromorphone Hcl] Itching  . Hydrocodone-Acetaminophen Nausea And Vomiting  . Levofloxacin Other (See Comments)    REACTION: GI upset  . Oxycodone Hcl Nausea And Vomiting  . Paroxetine Nausea And Vomiting    Paxil   . Tree Extract Other (See Comments)    "tested and told I was allergic to it; never experienced a reaction to it" (02/20/2012)    Review of Systems  Constitutional: Negative for fever, chills and fatigue.  HENT: Positive for congestion and sinus pressure. Negative for ear pain, facial swelling, hearing loss, nosebleeds, postnasal drip, rhinorrhea, sneezing, sore throat, tinnitus, trouble swallowing and voice change.   Eyes: Negative for pain, discharge, redness and itching.  Respiratory: Positive for cough and shortness of breath. Negative for chest tightness and wheezing.   Cardiovascular: Negative for chest pain and leg swelling.  Gastrointestinal: Negative for nausea, vomiting and abdominal pain.  Skin: Negative for rash.  Allergic/Immunologic: Negative.   Neurological: Positive for headaches. Negative for dizziness.  Hematological: Negative for adenopathy. Does not bruise/bleed easily.     Objective:   Filed Vitals:   02/08/15 1014  BP: 150/78  Pulse: 80  Resp: 16          Physical Exam  Constitutional: She appears well-developed and well-nourished. No distress.  Slightly raspy voice  HENT:  Head:  Normocephalic and atraumatic. Head is without right periorbital erythema and without left periorbital erythema.  Right Ear: Tympanic membrane, external ear and ear canal normal. No drainage or tenderness. No foreign bodies. Tympanic membrane is not injected, not scarred, not perforated, not erythematous, not retracted and not bulging. No middle ear effusion.  Left Ear: Tympanic membrane, external ear and ear canal normal. No drainage or tenderness. No foreign bodies. Tympanic membrane is not injected, not scarred, not perforated, not erythematous, not retracted and not bulging.  No middle ear effusion.  Nose: Nose normal. No mucosal edema, rhinorrhea, nose lacerations or sinus tenderness.  No foreign bodies.  Mouth/Throat: Oropharynx is clear and moist. No oropharyngeal exudate, posterior oropharyngeal edema, posterior oropharyngeal erythema or tonsillar abscesses.  Eyes: Lids are normal. Right eye exhibits no chemosis, no discharge and no exudate. No foreign body present in the right eye. Left eye exhibits no chemosis, no discharge and no exudate. No foreign body present in the left eye. Right conjunctiva is not injected. Left conjunctiva is not injected.  Neck: Neck supple. No tracheal tenderness present. No tracheal deviation and no edema present. No thyroid mass and no thyromegaly present.  Cardiovascular: Normal rate, regular rhythm, S1 normal and S2 normal.  Exam reveals no gallop.   No murmur heard. Pulmonary/Chest: No accessory muscle usage or stridor. No respiratory distress. She has no wheezes. She has no rhonchi. She has no rales.  Abdominal:  Soft.  Lymphadenopathy:       Head (right side): No tonsillar adenopathy present.       Head (left side): No tonsillar adenopathy present.    She has no cervical adenopathy.  Neurological: She is alert.  Skin: No rash noted. She is not diaphoretic.  Psychiatric: She has a normal mood and affect. Her behavior is normal.    Diagnostics:     Spirometry was performed and demonstrated an FEV1 of 1.66 at 78 % of predicted.  The patient had an Asthma Control Test with the following results: ACT Total Score: 13.    Assessment and Plan:   1. Severe persistent asthma, uncomplicated   2. Facial pain syndrome   3. Insomnia   4. Other allergic rhinitis   5. LPRD (laryngopharyngeal reflux disease)   6. Diastolic dysfunction      1. Treat facial pain and sleep dysfunction:   A. increase Periactin 4 mg tablet to 2 tablets at bedtime  2. Treat laryngopharyngeal reflux:   A. continue Dexilant 60 mg in the morning  B. start ranitidine 300 mg in the evening  3. Treat diastolic dysfunction:   A. Diltiazem CD 120 mg one tablet one time per day. Check blood pressure  4. Continue Dulera 200 2 inhalations twice a day  5. Continue Nasonex one spray each nostril twice a day  6. Continue montelukast 10 mg daily  7. Continue nystatin swish and swallow twice a day  8. Continue Xolair and EpiPen as prescribed by Dr. Ashok Cordia  9. Use bronchodilator and Astelin and Zyrtec if needed  10. Return to clinic in 4 weeks or earlier if problem  I have made an attempt to address each one of Carroll's problems during today's visit. Her chronic facial pain syndrome will be addressed by increasing her Periactin and we'll address her LPR by adding and ranitidine to her Dexilant. I will give her a trial of diltiazem to see if this helps her diastolic dysfunction and her shortness of breath when she exerts herself. She can continue to use anti-inflammatory medications for her atopic respiratory disease and we'll see what type of response we get over the course of the next 4 weeks.   Allena Katz, MD [place]

## 2015-02-08 NOTE — Patient Instructions (Addendum)
  1. Treat facial pain and sleep dysfunction:   A. increase Periactin 4 mg tablet to 2 tablets at bedtime  2. Treat laryngopharyngeal reflux:   A. continue Dexilant 60 mg in the morning  B. start ranitidine 300 mg in the evening  3. Treat diastolic dysfunction:   A. Diltiazem CD 120 mg one tablet one time per day. Check blood pressure  4. Continue Dulera 200 2 inhalations twice a day  5. Continue Nasonex one spray each nostril twice a day  6. Continue montelukast 10 mg daily  7. Continue nystatin swish and swallow twice a day  8. Continue Xolair and EpiPen as prescribed by Dr. Ashok Cordia  9. Use bronchodilator and Astelin and Zyrtec if needed  10. Return to clinic in 4 weeks or earlier if problem

## 2015-02-08 NOTE — Progress Notes (Signed)
Subjective:    Patient ID: Ebony Scott, female    DOB: 11/13/1950, 64 y.o.   MRN: [id number]  HPI 05/11/14 Has recently been seen by her gastroenterologist and has undergone an endoscopy due for early satiety and nausea. The endoscopy was relatively normal. Her gastroenterologist started her on Remeron at night to try to stimulate her appetite. She is only been on the medication for 11 days. Her weight is relatively stable at the present time. She denies any fevers or chills weight loss melena or hematochezia. Her mammogram and bone density tests are scheduled for later in August. She is also scheduled for a colonoscopy later this year. Her Pap smear is up-to-date. Wt Readings from Last 3 Encounters:  02/08/15 116 lb (52.617 kg)  01/28/15 115 lb 6.4 oz (52.345 kg)  01/04/15 115 lb (52.164 kg)  At that time, my plan was: Physical exam is completely normal. Her immunizations are up-to-date. Her cancer screening is up-to-date. Patient will get her mammogram and bone density in August. Her Pap smear is up-to-date. Her colonoscopy is scheduled for later this year. Patient's lab work is excellent. I recommended she discontinue Crestor. Also recommended she discontinue gabapentin. I would like to try to decrease the polypharmacy which may be contributing to her nausea. I believe her nausea and early satiety is likely anxiety related. If he continues to worsen we may need to focus our efforts on better controlling her anxiety.  11/11/14 Patient is here today for follow up.  Labs show a significant elevation in her LDL cholesterol, more than I would have expected.  Otherwise she is doing well. She continues to complain of stomach upset which she is working with her GI doctor on. She also complains of chronic shortness of breath. She recently had an echocardiogram performed which revealed grade 1 diastolic dysfunction. Patient has no substantial swelling in her legs. I still believe her shortness of breath is  primarily due to her respiratory issues, complicated by anxiety.  At that time, my plan was: Start Lipitor 40 mg by mouth daily and recheck fasting lipid panel in 3 months. TSH is within the therapeutic range and therefore I'll make no changes in her propylthiouracil.  02/08/15 Patient is here today for recheck. Her cholesterol is much better since starting Lipitor. Unfortunately her liver function tests have risen slightly. She does not drink any alcohol. She denies any use of Tylenol. The only change in her medication regimen has been Lipitor.  She states that she has had a sinus infection since November. She's been treated with 2 different rounds of azithromycin without success. She continues to have pain in her right maxillary sinus. She is having postnasal drip. She reports a sinus headache and subjective fevers and chills. She also reports occasional epistaxis and near constant pressure in her right maxillary sinus.   she does have a listed allergy to penicillin however this was just a rash and not an anaphylactic reaction. She is never tried cephalosporins  Her most recent lab work as listed below: Lab on 02/02/2015  Component Date Value Ref Range Status  . Sodium 02/02/2015 141  135 - 146 mmol/L Final  . Potassium 02/02/2015 4.7  3.5 - 5.3 mmol/L Final  . Chloride 02/02/2015 105  98 - 110 mmol/L Final  . CO2 02/02/2015 28  20 - 31 mmol/L Final  . Glucose, Bld 02/02/2015 78  70 - 99 mg/dL Final  . BUN 02/02/2015 19  7 - 25 mg/dL Final  .  Creat 02/02/2015 1.04* 0.50 - 0.99 mg/dL Final  . Total Bilirubin 02/02/2015 0.5  0.2 - 1.2 mg/dL Final  . Alkaline Phosphatase 02/02/2015 29* 33 - 130 U/L Final  . AST 02/02/2015 57* 10 - 35 U/L Final  . ALT 02/02/2015 76* 6 - 29 U/L Final  . Total Protein 02/02/2015 6.1  6.1 - 8.1 g/dL Final  . Albumin 02/02/2015 4.0  3.6 - 5.1 g/dL Final  . Calcium 02/02/2015 9.4  8.6 - 10.4 mg/dL Final  . GFR, Est African American 02/02/2015 65  >=60 mL/min Final  .  GFR, Est Non African American 02/02/2015 56* >=60 mL/min Final   Comment:   The estimated GFR is a calculation valid for adults (>=64 years old) that uses the CKD-EPI algorithm to adjust for age and sex. It is   not to be used for children, pregnant women, hospitalized patients,    patients on dialysis, or with rapidly changing kidney function. According to the NKDEP, eGFR >89 is normal, 60-89 shows mild impairment, 30-59 shows moderate impairment, 15-29 shows severe impairment and <15 is ESRD.     Marland Kitchen Cholesterol 02/02/2015 175  125 - 200 mg/dL Final  . Triglycerides 02/02/2015 72  <150 mg/dL Final  . HDL 02/02/2015 92  >=46 mg/dL Final  . Total CHOL/HDL Ratio 02/02/2015 1.9  <=5.0 Ratio Final  . VLDL 02/02/2015 14  <30 mg/dL Final  . LDL Cholesterol 02/02/2015 69  <130 mg/dL Final   Comment:   Total Cholesterol/HDL Ratio:CHD Risk                        Coronary Heart Disease Risk Table                                        Men       Women          1/2 Average Risk              3.4        3.3              Average Risk              5.0        4.4           2X Average Risk              9.6        7.1           3X Average Risk             23.4       11.0 Use the calculated Patient Ratio above and the CHD Risk table  to determine the patient's CHD Risk.   . WBC 02/02/2015 6.4  4.0 - 10.5 K/uL Final  . RBC 02/02/2015 3.66* 3.87 - 5.11 MIL/uL Final  . Hemoglobin 02/02/2015 12.2  12.0 - 15.0 g/dL Final  . HCT 02/02/2015 36.5  36.0 - 46.0 % Final  . MCV 02/02/2015 99.7  78.0 - 100.0 fL Final  . MCH 02/02/2015 33.3  26.0 - 34.0 pg Final  . MCHC 02/02/2015 33.4  30.0 - 36.0 g/dL Final  . RDW 02/02/2015 14.4  11.5 - 15.5 % Final  . Platelets 02/02/2015 237  150 - 400 K/uL Final  . MPV 02/02/2015 9.8  8.6 - 12.4 fL  Final  . Neutrophils Relative % 02/02/2015 49  43 - 77 % Final  . Neutro Abs 02/02/2015 3.1  1.7 - 7.7 K/uL Final  . Lymphocytes Relative 02/02/2015 33  12 - 46 % Final  .  Lymphs Abs 02/02/2015 2.1  0.7 - 4.0 K/uL Final  . Monocytes Relative 02/02/2015 14* 3 - 12 % Final  . Monocytes Absolute 02/02/2015 0.9  0.1 - 1.0 K/uL Final  . Eosinophils Relative 02/02/2015 3  0 - 5 % Final  . Eosinophils Absolute 02/02/2015 0.2  0.0 - 0.7 K/uL Final  . Basophils Relative 02/02/2015 1  0 - 1 % Final  . Basophils Absolute 02/02/2015 0.1  0.0 - 0.1 K/uL Final  . Smear Review 02/02/2015 Criteria for review not met   Final  Lab on 02/02/2015  Component Date Value Ref Range Status  . Preliminary Report 02/02/2015 Culture will be examined for 6 weeks before    Preliminary  . Preliminary Report 02/02/2015 issuing a Final Report.   Preliminary  . Acid Fast Smear 02/02/2015 No Acid Fast Bacilli Seen   Preliminary  . Culture 02/02/2015    Preliminary                   Value:  Microscopic findings suggest that this specimen   is not representative of lower respiratory   secretions.Suggest recollection if indicated.   . Gram Stain 02/02/2015 Abundant Squamous Epithelial Cells Present   Final  . Organism ID, Bacteria 02/02/2015   Microscopic findings suggest that this specimen   Final  . Organism ID, Bacteria 02/02/2015   is not representative of lower respiratory   Final   Comment: Critical Results Called to,Read Back By and Verified With: JOYCE AT [id number] ON [id number] BY [place]   . Organism ID, Bacteria 02/02/2015   secretions.Suggest recollection if indicated.   Final  . Smear Result 02/02/2015 No Yeast or Fungal Elements Seen   Preliminary  . Preliminary Report 02/02/2015 [facility]   Preliminary   Past Medical History  Diagnosis Date  . Chronic neck pain   . Allergic rhinitis   . Moderate persistent asthma     -FeV1 72% 2011, -IgE 102 2011, CT sinus Neg 2011  . GERD (gastroesophageal reflux disease)   . Hypothyroidism   . Osteoporosis     on reclast yearly  . Hiatal hernia   . Diverticulosis   . Hemorrhoids   . Asthma   . IBS (irritable bowel syndrome)   .  Complication of anesthesia     "had hard time waking up from it several times" (02/20/2012)  . Hypercholesteremia   . Pneumonia 04/2011; ~ 11/2011    "double; single" (02/20/2012)  . Exertional dyspnea   . Graves disease   . Headache(784.0)     "related to allergies; more at different times during the year" (02/20/2012)  . Arthritis     "mostly the hands" (02/20/2012)  . Fibromyalgia 11/2011  . Chronic lower back pain   . Depression     "some; don't take anything for it" (02/20/2012)  . Breast cancer ([place]) 1998    in remission  . Anxiety   . DVT (deep venous thrombosis) ([place])   . Allergy     SEASONAL  . Anemia   . Cataract     REMOVED   Past Surgical History  Procedure Laterality Date  . Cervical fusion  2003    C3-C4  . Cervical discectomy  10/2001    C5-C6  .  Vesicovaginal fistula closure w/ tah  1988  . Nasal septum surgery  1980's  . Anterior and posterior repair  1990's  . Breast lumpectomy  1998    left  . Debridement tennis elbow  ?1970's    right  . Knee arthroplasty  ?1990's    "?right; w/cartilage repair" (02/20/2012)  . Carpometacarpel ([facility]) fusion of thumb with autograft from radius  ~ 2009    "both thumbs" (02/20/2012)  . Cataract extraction w/ intraocular lens  implant, bilateral  2012  . Tonsillectomy  ~ 1953  . Posterior cervical fusion/foraminotomy  2004    "failed initial fusion; rewired  anterior neck" (02/20/2012)  . Colonoscopy    . Appendectomy    . Cholecystectomy    . Hysterectomy    . Esophagogastroduodenoscopy     Current Outpatient Prescriptions on File Prior to Visit  Medication Sig Dispense Refill  . acetaminophen (TYLENOL ARTHRITIS PAIN) 650 MG CR tablet Take 650 mg by mouth 2 (two) times daily.     Marland Kitchen albuterol (PROAIR HFA) 108 (90 BASE) MCG/ACT inhaler Inhale 1-2 puffs into the lungs every 6 (six) hours as needed for wheezing or shortness of breath. 1 Inhaler 3  . Alpha-D-Galactosidase (BEANO) TABS Take 1 tablet by mouth daily.     . Alpha-Lipoic  Acid 600 MG CAPS Take 600 mg by mouth daily. Once a day    . azelastine (ASTELIN) 0.1 % nasal spray HOLD while on antibiotic then resume Use in each nostril as directed 30 mL 2  . Calcium Citrate-Vitamin D (CITRACAL MAXIMUM) 315-250 MG-UNIT TABS Take 1 tablet by mouth 2 (two) times daily.     . cetirizine (ZYRTEC) 10 MG tablet HOLD while on azithromycin (Patient taking differently: Take 10 mg by mouth at bedtime. )    . Cholecalciferol (VITAMIN D) 2000 UNITS CAPS 1/2 at bedtime    . Ciprofloxacin-Fluocinolone (OTOVEL OT) Place in ear(s). As directed by Dr. Thornell Mule    . colchicine 0.6 MG tablet Take 1 tablet (0.6 mg total) by mouth as needed. 30 tablet 2  . Cyproheptadine HCl POWD by Does not apply route. As directed by Dr. Thornell Mule    . denosumab (PROLIA) 60 MG/ML SOLN injection Inject 60 mg into the skin every 6 (six) months. Administer in upper arm, thigh, or abdomen    . dexlansoprazole (DEXILANT) 60 MG capsule Take 60 mg by mouth daily.    . DULERA 200-5 MCG/ACT AERO Inhale 2 puffs into the lungs 2 (two) times daily.    Marland Kitchen EPINEPHrine (EPIPEN 2-PAK) 0.3 mg/0.3 mL IJ SOAJ injection Inject 0.3 mLs (0.3 mg total) into the muscle once. 1 Device 4  . gabapentin (NEURONTIN) 600 MG tablet Take 1 tablet (600 mg total) by mouth daily. 90 tablet 3  . hydrocortisone (PROCTOZONE-HC) 2.5 % rectal cream Apply as needed as directed 30 g 11  . lactase (LACTAID) 3000 UNITS tablet Take 3 tablets by mouth as needed. 3-4 tabs with dairy liquids and solids    . lipase/protease/amylase (CREON) 12000 UNITS CPEP capsule Take 1 capsule (12,000 Units total) by mouth 3 (three) times daily. CREON 24000-76000-120000 U Takes with meals 270 capsule 5  . mirtazapine (REMERON SOLTAB) 30 MG disintegrating tablet Take 1 tablet (30 mg total) by mouth at bedtime. 30 tablet 5  . mometasone (NASONEX) 50 MCG/ACT nasal spray Place 2 sprays into the nose 2 (two) times daily. 102 g 3  . montelukast (SINGULAIR) 10 MG tablet TAKE 1 TABLET BY  MOUTH ONCE DAILY 30  tablet 11  . neomycin-bacitracin-polymyxin (NEOSPORIN) ointment Apply 1 application topically every 12 (twelve) hours. Use on/in nose as directed as needed    . nystatin (MYCOSTATIN) 100000 UNIT/ML suspension Use as directed 5 mLs in the mouth or throat. Swish and spit out twice daily    . nystatin-triamcinolone ointment (MYCOLOG) as needed.    Marland Kitchen omalizumab (XOLAIR) 150 MG injection Inject 150 mg into the skin every 28 (twenty-eight) days. 150 SQ every 4 weeks    . Polyethyl Glycol-Propyl Glycol (SYSTANE ULTRA) 0.4-0.3 % SOLN Apply 1 drop to eye 2 (two) times daily as needed. 1-2 drops each eye every morning and at bedtime    . Prenatal Vit-Sel-Fe Fum-FA (VINATE M) 27-1 MG TABS Take 1 tablet by mouth daily. 30 each 11  . Probiotic Product (ALIGN) 4 MG CAPS Take 1 tablet by mouth daily. Once a day    . propylthiouracil (PTU) 50 MG tablet take 1/2 tablet (25 mg) twice daily 30 tablet 5  . pseudoephedrine-guaifenesin (MUCINEX D) 60-600 MG 12 hr tablet Take 1 tablet by mouth every 12 (twelve) hours. 60 tablet 5  . Respiratory Therapy Supplies (FLUTTER) DEVI Use as directed 1 each 0  . Simethicone (GAS-X EXTRA STRENGTH) 125 MG CAPS Take 2 each by mouth daily as needed. For gas relief    . atorvastatin (LIPITOR) 40 MG tablet Take 1 tablet (40 mg total) by mouth daily. (Patient not taking: Reported on 02/08/2015) 90 tablet 3  . [DISCONTINUED] methimazole (TAPAZOLE) 5 MG tablet Take 5 mg by mouth 3 (three) times daily.       No current facility-administered medications on file prior to visit.   Allergies  Allergen Reactions  . Dust Mite Extract Shortness Of Breath and Other (See Comments)    "sneezing" (02/20/2012)  . Molds & Smuts Shortness Of Breath  . Morphine Sulfate Itching  . Other Shortness Of Breath and Other (See Comments)    Grass and weeds "sneezing; filled sinuses" (02/20/2012)  . Penicillins Rash and Other (See Comments)    "welts" (02/20/2012)  . Reclast [Zoledronic  Acid]     Put in hospital  . Rofecoxib Swelling    Celebrex REACTION: feet swelling  . Shrimp Flavor Anaphylaxis    ALL SHELLFISH  . Tetracycline Hcl Nausea And Vomiting  . Dilaudid [Hydromorphone Hcl] Itching  . Hydrocodone-Acetaminophen Nausea And Vomiting  . Levofloxacin Other (See Comments)    REACTION: GI upset  . Oxycodone Hcl Nausea And Vomiting  . Paroxetine Nausea And Vomiting    Paxil   . Tree Extract Other (See Comments)    "tested and told I was allergic to it; never experienced a reaction to it" (02/20/2012)   Social History   Social History  . Marital Status: Divorced    Spouse Name: N/A  . Number of Children: 2  . Years of Education: college   Occupational History  . Disabled     Retired Print production planner  .     Social History Main Topics  . Smoking status: Passive Smoke Exposure - Never Smoker  . Smokeless tobacco: Never Used     Comment: Parents  . Alcohol Use: 0.0 oz/week    0 Standard drinks or equivalent per week     Comment: 02/20/2012 "couple glasses of wine/6 months"  . Drug Use: No  . Sexual Activity: No   Other Topics Concern  . Not on file   Social History Narrative   Patient lives at home alone. Patient  divorced.    Patient has her BS degree.   Right handed.   Caffeine- sometimes coffee.      [facility]:   Born in [place], [place]. She worked as a Copywriter, advertising. She has no pets currently. She does have indoor plants. Previously had mold in her home that was remediated. Carpet was removed.             Family History  Problem Relation Age of Onset  . Allergies Mother   . Heart disease Mother   . Arthritis Mother   . Lung cancer Mother   . Diabetes Mother   . Allergies Father   . Heart disease Father   . Arthritis Father   . Stroke Father   . Colon cancer      Maternal half aunt/Maternal half uncle  . Colitis Daughter   . Diabetes Maternal Grandfather      Review of Systems  All other systems reviewed  and are negative.      Objective:   Physical Exam  Constitutional: She is oriented to person, place, and time. She appears well-developed and well-nourished. No distress.  HENT:  Head: Normocephalic and atraumatic.  Right Ear: External ear normal.  Left Ear: External ear normal.  Nose: Mucosal edema and rhinorrhea present. Right sinus exhibits no maxillary sinus tenderness and no frontal sinus tenderness. Left sinus exhibits maxillary sinus tenderness and frontal sinus tenderness.  Mouth/Throat: Oropharynx is clear and moist. No oropharyngeal exudate.  Eyes: Conjunctivae and EOM are normal. Pupils are equal, round, and reactive to light. Right eye exhibits no discharge. Left eye exhibits no discharge. No scleral icterus.  Neck: Normal range of motion. Neck supple. No thyromegaly present.  Cardiovascular: Normal rate, regular rhythm and intact distal pulses.  Exam reveals no gallop and no friction rub.   Murmur heard. Pulmonary/Chest: Effort normal and breath sounds normal. No respiratory distress. She has no wheezes. She has no rales. She exhibits no tenderness.  Abdominal: Soft. Bowel sounds are normal. She exhibits no distension. There is no tenderness. There is no rebound.  Musculoskeletal: Normal range of motion. She exhibits no edema or tenderness.  Neurological: She is alert and oriented to person, place, and time. She has normal reflexes. No cranial nerve deficit. She exhibits normal muscle tone. Coordination normal.  Skin: Skin is warm. She is not diaphoretic.  Vitals reviewed.         Assessment & Plan:  Chronic maxillary sinusitis - Plan: cefdinir (OMNICEF) 300 MG capsule, predniSONE (DELTASONE) 20 MG tablet  HLD (hyperlipidemia)  Elevated LFTs  I am concerned that the elevations in her liver function test or possibly due to the Lipitor. Therefore I will have the patient temporarily discontinue Lipitor and recheck liver function test after 2 weeks. That being said the  Lipitor seems to be controlling her cholesterol.  If liver function test persistently remain elevated, I will also obtain a viral hepatitis panel and consider a right upper quadrant ultrasound. I will treat the patient with Omnicef and a prednisone taper pack for her chronic sinusitis. Symptoms persist I would proceed with a CT scan of her sinuses. Due to her long allergy list, my choices are limited as to how to treat her sinus infection. I will try Omnicef but I warned the patient to watch closely for any signs of allergic reaction

## 2015-02-08 NOTE — Telephone Encounter (Signed)
Ebony Scott states that she is no longer doing handling this type of problem. [name] can you please advise.

## 2015-02-09 NOTE — Telephone Encounter (Signed)
Ebony Scott continues to follow patients with PAN; she and I are meeting this afternoon regarding this patient.

## 2015-02-10 NOTE — Telephone Encounter (Signed)
Pt calling again to check on status if pan update pt can be reached @ [phone] wanting to know if dec charges have been made to the pan foundation please advise.Ebony Scott

## 2015-02-10 NOTE — Telephone Encounter (Signed)
I called the patient and told her we are working on this unfortunatly billing has added PAN as one of her Insurance's and it is causing issues

## 2015-02-10 NOTE — Telephone Encounter (Signed)
Patient says she received another EOB for November 10th.   She said she needs to re-apply for PAN, grant runs out 02/26/15 ------------------------------------ Advised patient that [facility] are still working on this.  To Raquel James for follow up

## 2015-02-14 ENCOUNTER — Encounter: Payer: Self-pay | Admitting: Family Medicine

## 2015-02-14 ENCOUNTER — Encounter: Payer: Self-pay | Admitting: Allergy and Immunology

## 2015-02-15 ENCOUNTER — Other Ambulatory Visit: Payer: Self-pay | Admitting: Family Medicine

## 2015-02-15 ENCOUNTER — Ambulatory Visit (INDEPENDENT_AMBULATORY_CARE_PROVIDER_SITE_OTHER): Payer: Medicare Other | Admitting: Family Medicine

## 2015-02-15 ENCOUNTER — Ambulatory Visit (HOSPITAL_COMMUNITY)
Admission: RE | Admit: 2015-02-15 | Discharge: 2015-02-15 | Disposition: A | Discharge status: Home / Self Care | Payer: Medicare Other | Source: Ambulatory Visit | Attending: Internal Medicine | Admitting: Internal Medicine

## 2015-02-15 ENCOUNTER — Other Ambulatory Visit: Payer: Medicare Other

## 2015-02-15 DIAGNOSIS — K449 Diaphragmatic hernia without obstruction or gangrene: Secondary | ICD-10-CM | POA: Diagnosis not present

## 2015-02-15 DIAGNOSIS — R1310 Dysphagia, unspecified: Secondary | ICD-10-CM | POA: Insufficient documentation

## 2015-02-15 DIAGNOSIS — R945 Abnormal results of liver function studies: Secondary | ICD-10-CM

## 2015-02-15 DIAGNOSIS — M810 Age-related osteoporosis without current pathological fracture: Secondary | ICD-10-CM

## 2015-02-15 DIAGNOSIS — M4312 Spondylolisthesis, cervical region: Secondary | ICD-10-CM | POA: Diagnosis not present

## 2015-02-15 LAB — BASIC METABOLIC PANEL
BUN: 23 mg/dL (ref 7–25)
CO2: 28 mmol/L (ref 20–31)
Calcium: 9.5 mg/dL (ref 8.6–10.4)
Chloride: 102 mmol/L (ref 98–110)
Creat: 1.18 mg/dL — ABNORMAL HIGH (ref 0.50–0.99)
Glucose, Bld: 115 mg/dL — ABNORMAL HIGH (ref 70–99)
Potassium: 3.4 mmol/L — ABNORMAL LOW (ref 3.5–5.3)
Sodium: 141 mmol/L (ref 135–146)

## 2015-02-15 MED ORDER — MAGNESIUM HYDROXIDE 400 MG/5ML PO SUSP
ORAL | Status: AC
  Filled 2015-02-15: qty 60

## 2015-02-15 MED ORDER — DENOSUMAB 60 MG/ML ~~LOC~~ SOLN
60.0000 mg | Freq: Once | SUBCUTANEOUS | Status: AC
  Administered 2015-02-15: 60 mg via SUBCUTANEOUS

## 2015-02-15 NOTE — Telephone Encounter (Signed)
INFORMED PT DR Neldon Mc OUT OF OFFICE WILL ADDRESS WHEN HE RETURNS

## 2015-02-16 ENCOUNTER — Telehealth: Payer: Self-pay | Admitting: Family Medicine

## 2015-02-16 ENCOUNTER — Encounter: Payer: Self-pay | Admitting: Family Medicine

## 2015-02-16 MED ORDER — FLUCONAZOLE 150 MG PO TABS: 150.0000 mg | ORAL_TABLET | Freq: Once | ORAL | Status: DC

## 2015-02-16 NOTE — Telephone Encounter (Signed)
Patient is needing a rx for diflucan if possible     [facility]

## 2015-02-16 NOTE — Telephone Encounter (Signed)
Er WTP OK to refill - med sent to [facility]

## 2015-02-17 LAB — HEPATIC FUNCTION PANEL
ALT: 40 U/L — ABNORMAL HIGH (ref 6–29)
AST: 28 U/L (ref 10–35)
Albumin: 4.1 g/dL (ref 3.6–5.1)
Alkaline Phosphatase: 31 U/L — ABNORMAL LOW (ref 33–130)
Bilirubin, Direct: 0.1 mg/dL (ref <=0.2)
Indirect Bilirubin: 0.4 mg/dL (ref 0.2–1.2)
Total Bilirubin: 0.5 mg/dL (ref 0.2–1.2)
Total Protein: 6.4 g/dL (ref 6.1–8.1)

## 2015-02-17 NOTE — Progress Notes (Signed)
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

## 2015-02-18 ENCOUNTER — Encounter: Payer: Self-pay | Admitting: Family Medicine

## 2015-02-18 ENCOUNTER — Other Ambulatory Visit: Payer: Self-pay | Admitting: Family Medicine

## 2015-02-18 DIAGNOSIS — E785 Hyperlipidemia, unspecified: Secondary | ICD-10-CM

## 2015-02-18 DIAGNOSIS — Z79899 Other long term (current) drug therapy: Secondary | ICD-10-CM

## 2015-02-18 MED ORDER — PRAVASTATIN SODIUM 20 MG PO TABS: 20.0000 mg | ORAL_TABLET | Freq: Every day | ORAL | Status: DC

## 2015-02-18 NOTE — Telephone Encounter (Addendum)
Pt rec'd her lab results via [facility] and has a few questions about them. Please call [phone]

## 2015-02-20 NOTE — Progress Notes (Signed)
Quick Note:  My chart message re: MBS showed same - 2015 ______

## 2015-02-25 ENCOUNTER — Encounter: Payer: Self-pay | Admitting: Family Medicine

## 2015-02-25 ENCOUNTER — Ambulatory Visit (INDEPENDENT_AMBULATORY_CARE_PROVIDER_SITE_OTHER): Payer: Medicare Other

## 2015-02-25 DIAGNOSIS — J4540 Moderate persistent asthma, uncomplicated: Secondary | ICD-10-CM

## 2015-02-25 NOTE — Telephone Encounter (Signed)
This encounter was created in error - please disregard.

## 2015-02-28 LAB — FUNGUS CULTURE W SMEAR: Smear Result: NONE SEEN

## 2015-02-28 MED ORDER — OMALIZUMAB 150 MG ~~LOC~~ SOLR
150.0000 mg | Freq: Once | SUBCUTANEOUS | Status: AC
  Administered 2015-02-25: 150 mg via SUBCUTANEOUS

## 2015-03-01 ENCOUNTER — Encounter: Payer: Self-pay | Admitting: Family Medicine

## 2015-03-04 ENCOUNTER — Encounter: Payer: Self-pay | Admitting: Pulmonary Disease

## 2015-03-07 NOTE — Telephone Encounter (Deleted)
JN can you please advise.

## 2015-03-08 ENCOUNTER — Encounter: Payer: Self-pay | Admitting: Internal Medicine

## 2015-03-08 ENCOUNTER — Ambulatory Visit (INDEPENDENT_AMBULATORY_CARE_PROVIDER_SITE_OTHER): Payer: Medicare Other | Admitting: Allergy and Immunology

## 2015-03-08 ENCOUNTER — Encounter: Payer: Self-pay | Admitting: Allergy and Immunology

## 2015-03-08 VITALS — BP 138/72 | HR 80 | Resp 16

## 2015-03-08 DIAGNOSIS — J3089 Other allergic rhinitis: Secondary | ICD-10-CM

## 2015-03-08 DIAGNOSIS — I5189 Other ill-defined heart diseases: Secondary | ICD-10-CM

## 2015-03-08 DIAGNOSIS — G4700 Insomnia, unspecified: Secondary | ICD-10-CM

## 2015-03-08 DIAGNOSIS — J4550 Severe persistent asthma, uncomplicated: Secondary | ICD-10-CM

## 2015-03-08 DIAGNOSIS — J387 Other diseases of larynx: Secondary | ICD-10-CM | POA: Diagnosis not present

## 2015-03-08 DIAGNOSIS — I519 Heart disease, unspecified: Secondary | ICD-10-CM

## 2015-03-08 DIAGNOSIS — G500 Trigeminal neuralgia: Secondary | ICD-10-CM

## 2015-03-08 DIAGNOSIS — G518 Other disorders of facial nerve: Secondary | ICD-10-CM

## 2015-03-08 DIAGNOSIS — K219 Gastro-esophageal reflux disease without esophagitis: Secondary | ICD-10-CM

## 2015-03-08 NOTE — Patient Instructions (Signed)
  1. Treat facial pain and sleep dysfunction:   A. Continue Periactin 4 mg tablet to 2 tablets at bedtime  2. Treat laryngopharyngeal reflux:   A. Continue Dexilant 60 mg in the morning  B. Continue ranitidine 300 mg in the evening  4. Continue Dulera 200 2 inhalations twice a day  5. Continue Nasonex one spray each nostril twice a day  6. Continue montelukast 10 mg daily  7. Continue nystatin swish and swallow twice a day  8. Continue Xolair and EpiPen as prescribed by Dr. Ashok Cordia  9. Use bronchodilator and Astelin and Zyrtec if needed  10. Return to clinic in 12 weeks or earlier if problem

## 2015-03-08 NOTE — Telephone Encounter (Signed)
I contacted the patient directly.  Dr. Carlean Purl did send her a message on the results from 02/15/15.  I read her the response that was sent on 02/20/2015 at 5:30 pm.  All questions answered.  She is very concerned about her episode of "nasal regurgitation" .  She reports she had food in her nose that she blew out.  Dr. Carlean Purl she asks if there is anyone she should see for this?  She has not had another episode since 02/17/15, but is "very disturbed by it.

## 2015-03-08 NOTE — Progress Notes (Signed)
[facility]  Follow-up Note  Referring Provider: [name], MD Primary Provider: Odette Fraction, MD Date of Office Visit: 03/08/2015  Subjective:   Ebony Scott is a 65 y.o. female who returns to the [facility] in re-evaluation of the following:  HPI Comments: Ebony Scott returns to this clinic on 03/08/2015 in reevaluation of multiple issues. 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 Ebony Scott did have prednisone and Ceftin ear administered by Dr. Dennard Schaumann. Ebony Scott has continued to use her higher dose of Periactin. Ebony Scott thinks that Ebony Scott has less throat clearing and raspy voice and coughing. Ebony Scott continues to use a combination of DEXA wants and ranitidine for her reflux. Ebony Scott thinks her asthma is stable. Ebony Scott continues to use her Dulera 200  2 inhalations twice a day. Ebony Scott still can exert herself very well. Ebony Scott gets dyspneic whenever Ebony Scott exerts herself. Ebony Scott was intolerant of diltiazem administered for her diastolic dysfunction syndrome 4 Ebony Scott developed significant swelling of her lower extremities when using this medication. Ebony Scott thinks that her insomnia is better. As stated above Ebony Scott continues on Periactin 8 mg at nighttime. Ebony Scott's not been having any problems with her nose recently. Ebony Scott did have an episode of nasal regurgitation recently. This is a relatively infrequent problem but certainly has happened in the past. Her barium swallow obtained in December identified Incomplete epiglottic inversion during swallowing, with laryngeal penetration of thick liquid to the level of the vocal cords which basically correlates with the modified barium swallow Ebony Scott had performed back in August 2015 area.   Current Outpatient Prescriptions on File Prior to Visit  Medication Sig Dispense Refill  . acetaminophen (TYLENOL ARTHRITIS PAIN) 650 MG CR tablet Take 650 mg by mouth 2 (two) times daily.     Marland Kitchen albuterol (PROAIR HFA)  108 (90 BASE) MCG/ACT inhaler Inhale 1-2 puffs into the lungs every 6 (six) hours as needed for wheezing or shortness of breath. 1 Inhaler 3  . Alpha-D-Galactosidase (BEANO) TABS Take 1 tablet by mouth daily.     . Alpha-Lipoic Acid 600 MG CAPS Take 600 mg by mouth daily. Once a day    . Calcium Citrate-Vitamin D (CITRACAL MAXIMUM) 315-250 MG-UNIT TABS Take 1 tablet by mouth 2 (two) times daily.     . Cholecalciferol (VITAMIN D) 2000 UNITS CAPS 1/2 at bedtime    . Ciprofloxacin-Fluocinolone (OTOVEL OT) Place in ear(s). As directed by Dr. Thornell Mule    . colchicine 0.6 MG tablet Take 1 tablet (0.6 mg total) by mouth as needed. 30 tablet 2  . cyproheptadine (PERIACTIN) 4 MG tablet Take two tablets every evening 30 tablet 3  . Cyproheptadine HCl POWD by Does not apply route. As directed by Dr. Thornell Mule    . denosumab (PROLIA) 60 MG/ML SOLN injection Inject 60 mg into the skin every 6 (six) months. Administer in upper arm, thigh, or abdomen    . dexlansoprazole (DEXILANT) 60 MG capsule Take 60 mg by mouth daily.    . DULERA 200-5 MCG/ACT AERO Inhale 2 puffs into the lungs 2 (two) times daily.    Marland Kitchen EPINEPHrine (EPIPEN 2-PAK) 0.3 mg/0.3 mL IJ SOAJ injection Inject 0.3 mLs (0.3 mg total) into the muscle once. 1 Device 4  . gabapentin (NEURONTIN) 600 MG tablet Take 1 tablet (600 mg total) by mouth daily. 90 tablet 3  . lactase (LACTAID) 3000 UNITS tablet Take 3 tablets by mouth as needed. 3-4 tabs  with dairy liquids and solids    . lipase/protease/amylase (CREON) 12000 UNITS CPEP capsule Take 1 capsule (12,000 Units total) by mouth 3 (three) times daily. CREON 24000-76000-120000 U Takes with meals 270 capsule 5  . mirtazapine (REMERON SOLTAB) 30 MG disintegrating tablet Take 1 tablet (30 mg total) by mouth at bedtime. 30 tablet 5  . mometasone (NASONEX) 50 MCG/ACT nasal spray Place 2 sprays into the nose 2 (two) times daily. 102 g 3  . montelukast (SINGULAIR) 10 MG tablet TAKE 1 TABLET BY MOUTH ONCE DAILY 30 tablet  11  . omalizumab (XOLAIR) 150 MG injection Inject 150 mg into the skin every 28 (twenty-eight) days. 150 SQ every 4 weeks    . Polyethyl Glycol-Propyl Glycol (SYSTANE ULTRA) 0.4-0.3 % SOLN Apply 1 drop to eye 2 (two) times daily as needed. 1-2 drops each eye every morning and at bedtime    . pravastatin (PRAVACHOL) 20 MG tablet Take 1 tablet (20 mg total) by mouth daily. 90 tablet 3  . predniSONE (DELTASONE) 20 MG tablet 3 tabs poqday 1-2, 2 tabs poqday 3-4, 1 tab poqday 5-6 12 tablet 0  . Prenatal Vit-Sel-Fe Fum-FA (VINATE M) 27-1 MG TABS Take 1 tablet by mouth daily. 30 each 11  . Probiotic Product (ALIGN) 4 MG CAPS Take 1 tablet by mouth daily. Once a day    . propylthiouracil (PTU) 50 MG tablet take 1/2 tablet (25 mg) twice daily 30 tablet 5  . pseudoephedrine-guaifenesin (MUCINEX D) 60-600 MG 12 hr tablet Take 1 tablet by mouth every 12 (twelve) hours. 60 tablet 5  . Respiratory Therapy Supplies (FLUTTER) DEVI Use as directed 1 each 0  . Simethicone (GAS-X EXTRA STRENGTH) 125 MG CAPS Take 2 each by mouth daily as needed. For gas relief    . azelastine (ASTELIN) 0.1 % nasal spray HOLD while on antibiotic then resume Use in each nostril as directed (Patient not taking: Reported on 03/08/2015) 30 mL 2  . cetirizine (ZYRTEC) 10 MG tablet HOLD while on azithromycin (Patient not taking: Reported on 03/08/2015)    . fluconazole (DIFLUCAN) 150 MG tablet Take 1 tablet (150 mg total) by mouth once. (Patient not taking: Reported on 03/08/2015) 1 tablet 0  . hydrocortisone (PROCTOZONE-HC) 2.5 % rectal cream Apply as needed as directed (Patient not taking: Reported on 03/08/2015) 30 g 11  . neomycin-bacitracin-polymyxin (NEOSPORIN) ointment Apply 1 application topically every 12 (twelve) hours. Use on/in nose as directed as needed    . nystatin (MYCOSTATIN) 100000 UNIT/ML suspension Use as directed 5 mLs in the mouth or throat. Reported on 03/08/2015    . nystatin-triamcinolone ointment (MYCOLOG) as needed.  Reported on 03/08/2015    . [DISCONTINUED] methimazole (TAPAZOLE) 5 MG tablet Take 5 mg by mouth 3 (three) times daily.       No current facility-administered medications on file prior to visit.    No orders of the defined types were placed in this encounter.    Past Medical History  Diagnosis Date  . Chronic neck pain   . Allergic rhinitis   . Moderate persistent asthma     -FeV1 72% 2011, -IgE 102 2011, CT sinus Neg 2011  . GERD (gastroesophageal reflux disease)   . Hypothyroidism   . Osteoporosis     on reclast yearly  . Hiatal hernia   . Diverticulosis   . Hemorrhoids   . Asthma   . IBS (irritable bowel syndrome)   . Complication of anesthesia     "had hard time waking  up from it several times" (02/20/2012)  . Hypercholesteremia   . Pneumonia 04/2011; ~ 11/2011    "double; single" (02/20/2012)  . Exertional dyspnea   . Graves disease   . Headache(784.0)     "related to allergies; more at different times during the year" (02/20/2012)  . Arthritis     "mostly the hands" (02/20/2012)  . Fibromyalgia 11/2011  . Chronic lower back pain   . Depression     "some; don't take anything for it" (02/20/2012)  . Breast cancer ([place]) 1998    in remission  . Anxiety   . DVT (deep venous thrombosis) ([place])   . Allergy     SEASONAL  . Anemia   . Cataract     REMOVED    Past Surgical History  Procedure Laterality Date  . Cervical fusion  2003    C3-C4  . Cervical discectomy  10/2001    C5-C6  . Vesicovaginal fistula closure w/ tah  1988  . Nasal septum surgery  1980's  . Anterior and posterior repair  1990's  . Breast lumpectomy  1998    left  . Debridement tennis elbow  ?1970's    right  . Knee arthroplasty  ?1990's    "?right; w/cartilage repair" (02/20/2012)  . Carpometacarpel ([facility]) fusion of thumb with autograft from radius  ~ 2009    "both thumbs" (02/20/2012)  . Cataract extraction w/ intraocular lens  implant, bilateral  2012  . Tonsillectomy  ~ 1953  . Posterior cervical  fusion/foraminotomy  2004    "failed initial fusion; rewired  anterior neck" (02/20/2012)  . Colonoscopy    . Appendectomy    . Cholecystectomy    . Hysterectomy    . Esophagogastroduodenoscopy      Allergies  Allergen Reactions  . Dust Mite Extract Shortness Of Breath and Other (See Comments)    "sneezing" (02/20/2012)  . Molds & Smuts Shortness Of Breath  . Morphine Sulfate Itching  . Other Shortness Of Breath and Other (See Comments)    Grass and weeds "sneezing; filled sinuses" (02/20/2012)  . Penicillins Rash and Other (See Comments)    "welts" (02/20/2012)  . Reclast [Zoledronic Acid]     Put in hospital  . Rofecoxib Swelling    Celebrex REACTION: feet swelling  . Shrimp Flavor Anaphylaxis    ALL SHELLFISH  . Tetracycline Hcl Nausea And Vomiting  . Dilaudid [Hydromorphone Hcl] Itching  . Hydrocodone-Acetaminophen Nausea And Vomiting  . Levofloxacin Other (See Comments)    REACTION: GI upset  . Oxycodone Hcl Nausea And Vomiting  . Paroxetine Nausea And Vomiting    Paxil   . Diltiazem Swelling  . Tree Extract Other (See Comments)    "tested and told I was allergic to it; never experienced a reaction to it" (02/20/2012)    Review of systems negative except as noted in HPI / PMHx or noted below:  Review of Systems  Constitutional: Negative.   HENT: Negative.   Eyes: Negative.   Respiratory: Negative.   Cardiovascular: Negative.   Gastrointestinal: Negative.   Genitourinary: Negative.   Musculoskeletal: Negative.   Skin: Negative.   Neurological: Negative.   Endo/Heme/Allergies: Negative.   Psychiatric/Behavioral: Negative.      Objective:   Filed Vitals:   03/08/15 1113  BP: 138/72  Pulse: 80  Resp: 16          Physical Exam  Constitutional: Ebony Scott is well-developed, well-nourished, and in no distress. No distress.  Slightly raspy voice  HENT:  Head: Normocephalic.  Right Ear: Tympanic membrane, external ear and ear canal normal.  Left Ear: Tympanic  membrane, external ear and ear canal normal.  Nose: Nose normal. No mucosal edema or rhinorrhea.  Mouth/Throat: Uvula is midline, oropharynx is clear and moist and mucous membranes are normal. No oropharyngeal exudate.  Eyes: Conjunctivae are normal.  Neck: Trachea normal. No tracheal tenderness present. No tracheal deviation present. No thyromegaly present.  Cardiovascular: Normal rate, regular rhythm, S1 normal, S2 normal and normal heart sounds.   No murmur heard. Pulmonary/Chest: Breath sounds normal. No stridor. No respiratory distress. Ebony Scott has no wheezes. Ebony Scott has no rales.  Musculoskeletal: Ebony Scott exhibits no edema.  Lymphadenopathy:       Head (right side): No tonsillar adenopathy present.       Head (left side): No tonsillar adenopathy present.    Ebony Scott has no cervical adenopathy.    Ebony Scott has no axillary adenopathy.  Neurological: Ebony Scott is alert. Gait normal.  Skin: No rash noted. Ebony Scott is not diaphoretic. No erythema. Nails show no clubbing.  Psychiatric: Mood and affect normal.    Diagnostics:    Spirometry was performed and demonstrated an FEV1 of 1.56 at 76 % of predicted.  The patient had an Asthma Control Test with the following results: ACT Total Score: 17.    Assessment and Plan:   1. Severe persistent asthma, uncomplicated   2. Facial pain syndrome   3. Insomnia   4. Other allergic rhinitis   5. LPRD (laryngopharyngeal reflux disease)   6. Diastolic dysfunction     1. Treat facial pain and sleep dysfunction:   A. Continue Periactin 4 mg tablet to 2 tablets at bedtime  2. Treat laryngopharyngeal reflux:   A. Continue Dexilant 60 mg in the morning  B. Continue ranitidine 300 mg in the evening  4. Continue Dulera 200 2 inhalations twice a day  5. Continue Nasonex one spray each nostril twice a day  6. Continue montelukast 10 mg daily  7. Continue nystatin swish and swallow twice a day  8. Continue Xolair and EpiPen as prescribed by Dr. Ashok Cordia  9. Use  bronchodilator and Astelin and Zyrtec if needed  10. Return to clinic in 12 weeks or earlier if problem  It appears as though Ebony Scott is doing better in some regard concerning her facial pain syndrome and insomnia and allergic rhinitis and reflux. The main issue that probably needs a little bit more evaluation at some point is the fact that Ebony Scott still very dyspneic on exertion which may be or may not be tied up with diastolic dysfunction. Initial attempts at addressing this issue were not very successful secondary to significant angioedema of her lower extremities when using her calcium channel blocker. Ebony Scott is not very interested in pursuing further evaluation or treatment for this issue at this point. The other main issue is the fact that Ebony Scott had nasal regurgitation recently. Her modified barium swallow and her most recent esophageal study did suggest that Ebony Scott has some degree of missed coordination regarding her pharyngeal and laryngeal structures. I'm not sure that anything can be done for this condition at this point in time. Ebony Scott's going to touch base with Dr. Carlean Purl about whether or not there is anything that needs to be done further concerning her esophageal issue. Allena Katz, MD [place]

## 2015-03-15 ENCOUNTER — Other Ambulatory Visit: Payer: Self-pay

## 2015-03-15 MED ORDER — MOMETASONE FURO-FORMOTEROL FUM 200-5 MCG/ACT IN AERO: 2.0000 | INHALATION_SPRAY | Freq: Two times a day (BID) | RESPIRATORY_TRACT | Status: DC

## 2015-03-16 ENCOUNTER — Telehealth: Payer: Self-pay | Admitting: Pulmonary Disease

## 2015-03-16 ENCOUNTER — Other Ambulatory Visit: Payer: Self-pay | Admitting: Family Medicine

## 2015-03-16 ENCOUNTER — Encounter: Payer: Self-pay | Admitting: Allergy and Immunology

## 2015-03-16 MED ORDER — VINATE M 27-1 MG PO TABS: 1.0000 | ORAL_TABLET | Freq: Every day | ORAL | Status: DC

## 2015-03-16 NOTE — Telephone Encounter (Signed)
Medication called/sent to requested pharmacy  

## 2015-03-16 NOTE — Telephone Encounter (Signed)
#   vials:1 Ordered date:01/24/16 Shipping Date:01/25/16 

## 2015-03-17 NOTE — Telephone Encounter (Signed)
#   Vials:1 Arrival Date:03/17/15 Lot [id number] Exp Date:6/20

## 2015-03-18 ENCOUNTER — Telehealth: Payer: Self-pay | Admitting: Pulmonary Disease

## 2015-03-18 LAB — AFB CULTURE WITH SMEAR (NOT AT ARMC): Acid Fast Smear: NONE SEEN

## 2015-03-18 NOTE — Telephone Encounter (Signed)
Patient notified of Dr. Ammie Dalton recommendations. Patient says that she will wait and schedule an appointment next week if she is not better after the weekend. Advised patient that if she gets worse during the weekend to seek out urgent care. Nothing further needed.

## 2015-03-18 NOTE — Telephone Encounter (Signed)
Needs to be evaluated given her multiple allergies and chronic problems with GI upset & sinusitis. JN

## 2015-03-18 NOTE — Telephone Encounter (Signed)
Spoke with pt, c/o increased sinus and chest congestion, PND, hoarseness, prod cough with some yellow mucus, nausea, upset stomach, diarrhea Xfew days.  Has taken delsym to help with cough, nothing to help with stomach.  Pt uses [facility].  JN please advise on recs.  Thanks.

## 2015-03-25 ENCOUNTER — Ambulatory Visit (INDEPENDENT_AMBULATORY_CARE_PROVIDER_SITE_OTHER): Payer: Medicare Other

## 2015-03-25 DIAGNOSIS — J4540 Moderate persistent asthma, uncomplicated: Secondary | ICD-10-CM

## 2015-03-25 MED ORDER — OMALIZUMAB 150 MG ~~LOC~~ SOLR
150.0000 mg | SUBCUTANEOUS | Status: DC
  Administered 2015-03-25: 150 mg via SUBCUTANEOUS

## 2015-03-25 NOTE — Telephone Encounter (Signed)
Spoke with pt, states that her allergist suggested that a medication could be given to her to help her "cough deeper" to get a better sputum sample.   JN please advise on your recs.  Thanks!

## 2015-03-25 NOTE — Telephone Encounter (Signed)
Pt has not heard anything and following up on this. [phone]

## 2015-04-06 ENCOUNTER — Telehealth: Payer: Self-pay | Admitting: Pulmonary Disease

## 2015-04-06 NOTE — Telephone Encounter (Signed)
#   vials:1 Ordered date:04/06/15 Shipping Date:04/07/15

## 2015-04-07 NOTE — Telephone Encounter (Signed)
#   Vials:1 Arrival Date:04/07/15 Lot [id number] Exp Date:11/20

## 2015-04-08 ENCOUNTER — Other Ambulatory Visit: Payer: Self-pay | Admitting: Neurology

## 2015-04-08 MED ORDER — NYSTATIN 100000 UNIT/ML MT SUSP: 5.0000 mL | Freq: Four times a day (QID) | OROMUCOSAL | Status: DC

## 2015-04-08 MED ORDER — DEXLANSOPRAZOLE 60 MG PO CPDR: 60.0000 mg | DELAYED_RELEASE_CAPSULE | Freq: Every day | ORAL | Status: DC

## 2015-04-11 ENCOUNTER — Telehealth: Payer: Self-pay | Admitting: Pulmonary Disease

## 2015-04-14 NOTE — Telephone Encounter (Signed)
Liz please advise if you have spoken with this pt.  Thanks  

## 2015-04-14 NOTE — Telephone Encounter (Signed)
Called, left msg on her voicemail to call back.

## 2015-04-18 NOTE — Telephone Encounter (Signed)
Kathlee Nations, has this been taken care of yet?  Please advise.

## 2015-04-19 NOTE — Telephone Encounter (Signed)
This has been taken care of.  Ebony Scott

## 2015-04-22 ENCOUNTER — Ambulatory Visit: Payer: Medicare Other

## 2015-04-25 ENCOUNTER — Ambulatory Visit (INDEPENDENT_AMBULATORY_CARE_PROVIDER_SITE_OTHER): Payer: Medicare Other

## 2015-04-25 DIAGNOSIS — J4540 Moderate persistent asthma, uncomplicated: Secondary | ICD-10-CM

## 2015-04-25 MED ORDER — OMALIZUMAB 150 MG ~~LOC~~ SOLR
150.0000 mg | Freq: Once | SUBCUTANEOUS | Status: AC
  Administered 2015-04-25: 150 mg via SUBCUTANEOUS

## 2015-04-29 ENCOUNTER — Ambulatory Visit (INDEPENDENT_AMBULATORY_CARE_PROVIDER_SITE_OTHER): Payer: Medicare Other | Admitting: Pulmonary Disease

## 2015-04-29 ENCOUNTER — Encounter: Payer: Self-pay | Admitting: Pulmonary Disease

## 2015-04-29 VITALS — BP 142/78 | HR 82 | Ht 62.0 in | Wt 116.0 lb

## 2015-04-29 DIAGNOSIS — J3089 Other allergic rhinitis: Secondary | ICD-10-CM

## 2015-04-29 DIAGNOSIS — K219 Gastro-esophageal reflux disease without esophagitis: Secondary | ICD-10-CM

## 2015-04-29 DIAGNOSIS — J4550 Severe persistent asthma, uncomplicated: Secondary | ICD-10-CM | POA: Diagnosis not present

## 2015-04-29 LAB — PULMONARY FUNCTION TEST
DL/VA % pred: 81 %
DL/VA: 3.61 ml/min/mmHg/L
DLCO cor % pred: 62 %
DLCO cor: 12.87 ml/min/mmHg
DLCO unc % pred: 63 %
DLCO unc: 12.99 ml/min/mmHg
FEF 25-75 Post: 2.23 L/sec
FEF 25-75 Pre: 2.59 L/sec
FEF2575-%Change-Post: -13 %
FEF2575-%Pred-Post: 118 %
FEF2575-%Pred-Pre: 137 %
FEV1-%Change-Post: -3 %
FEV1-%Pred-Post: 90 %
FEV1-%Pred-Pre: 92 %
FEV1-Post: 1.89 L
FEV1-Pre: 1.95 L
FEV1FVC-%Change-Post: -3 %
FEV1FVC-%Pred-Pre: 112 %
FEV6-%Change-Post: 0 %
FEV6-%Pred-Post: 85 %
FEV6-%Pred-Pre: 84 %
FEV6-Post: 2.25 L
FEV6-Pre: 2.24 L
FEV6FVC-%Pred-Post: 104 %
FEV6FVC-%Pred-Pre: 104 %
FVC-%Change-Post: 0 %
FVC-%Pred-Post: 81 %
FVC-%Pred-Pre: 81 %
FVC-Post: 2.25 L
FVC-Pre: 2.24 L
Post FEV1/FVC ratio: 84 %
Post FEV6/FVC ratio: 100 %
Pre FEV1/FVC ratio: 87 %
Pre FEV6/FVC Ratio: 100 %
RV % pred: 83 %
RV: 1.65 L
TLC % pred: 84 %
TLC: 3.91 L

## 2015-04-29 NOTE — Patient Instructions (Addendum)
   Restart your Zyrtec daily.  Continue taking your Singulair & Dulera as prescribed.  I will see you back in 3 months or sooner if needed. Call me if you have any new breathing problems before your next appointment.  TESTS ORDERED: 1. Spirometry with bronchodilator challenge & DLCO at next appointment 2. Six-minute walk test at next appointment

## 2015-04-29 NOTE — Progress Notes (Signed)
PFT done today. 

## 2015-04-29 NOTE — Progress Notes (Signed)
Subjective:    Patient ID: Ebony Scott, female    DOB: 11-24-1950, 65 y.o.   MRN: [id number]  C.C.:  Follow-up for Severe, Persistent Asthma, GERD, & Allergic Rhinitis.  HPI Severe, Persistent Asthma:  Currently on Xolair for 4 years with questionable symptomatic benefit. Peak flows are consistently around 275. She reports nocturnal awakening with coughing productive of a yellow mucus. She is unsure if she is having actual wheezing. She does report her dyspnea has worsened some. Hasn't used her rescue inhaler but once since she got it. She continues to be compliant with her [facility]. She has been taking Delsym at night to help with her cough. No exacerbations since the end of December.   GERD:  Follows w/ GI & is treated with Dexilant & Ranitidine. She denies any significant reflux or dyspepsia. No morning brash water taste.   Allergic Rhinitis:  Previously was evaluated by ENT & follows with Dr. Neldon Mc. She came off [place] in December. She has continued to take Singulair & Nasonex. She has had increased sinus congestion & pressure starting in the last few weeks.   Review of Systems No chest pain or pressure. Denies any subjective fever but does have occasional chills & rare sweats.   Allergies  Allergen Reactions  . Dust Mite Extract Shortness Of Breath and Other (See Comments)    "sneezing" (02/20/2012)  . Molds & Smuts Shortness Of Breath  . Morphine Sulfate Itching  . Other Shortness Of Breath and Other (See Comments)    Grass and weeds "sneezing; filled sinuses" (02/20/2012)  . Penicillins Rash and Other (See Comments)    "welts" (02/20/2012)  . Reclast [Zoledronic Acid]     Put in hospital  . Rofecoxib Swelling    Celebrex REACTION: feet swelling  . Shrimp Flavor Anaphylaxis    ALL SHELLFISH  . Tetracycline Hcl Nausea And Vomiting  . Dilaudid [Hydromorphone Hcl] Itching  . Hydrocodone-Acetaminophen Nausea And Vomiting  . Levofloxacin Other (See Comments)    REACTION: GI  upset  . Oxycodone Hcl Nausea And Vomiting  . Paroxetine Nausea And Vomiting    Paxil   . Diltiazem Swelling  . Tree Extract Other (See Comments)    "tested and told I was allergic to it; never experienced a reaction to it" (02/20/2012)    Current Outpatient Prescriptions on File Prior to Visit  Medication Sig Dispense Refill  . acetaminophen (TYLENOL ARTHRITIS PAIN) 650 MG CR tablet Take 650 mg by mouth 2 (two) times daily.     Marland Kitchen albuterol (PROAIR HFA) 108 (90 BASE) MCG/ACT inhaler Inhale 1-2 puffs into the lungs every 6 (six) hours as needed for wheezing or shortness of breath. 1 Inhaler 3  . Alpha-D-Galactosidase (BEANO) TABS Take 1 tablet by mouth daily.     . Alpha-Lipoic Acid 600 MG CAPS Take 600 mg by mouth daily. Once a day    . Calcium Citrate-Vitamin D (CITRACAL MAXIMUM) 315-250 MG-UNIT TABS Take 1 tablet by mouth 2 (two) times daily.     . Cholecalciferol (VITAMIN D) 2000 UNITS CAPS 1/2 at bedtime    . Ciprofloxacin-Fluocinolone (OTOVEL OT) Place in ear(s). As directed by Dr. Thornell Mule    . colchicine 0.6 MG tablet Take 1 tablet (0.6 mg total) by mouth as needed. 30 tablet 2  . cyproheptadine (PERIACTIN) 4 MG tablet Take two tablets every evening 30 tablet 3  . Cyproheptadine HCl POWD by Does not apply route. As directed by Dr. Thornell Mule    .  denosumab (PROLIA) 60 MG/ML SOLN injection Inject 60 mg into the skin every 6 (six) months. Administer in upper arm, thigh, or abdomen    . dexlansoprazole (DEXILANT) 60 MG capsule Take 1 capsule (60 mg total) by mouth daily. 30 capsule 5  . EPINEPHrine (EPIPEN 2-PAK) 0.3 mg/0.3 mL IJ SOAJ injection Inject 0.3 mLs (0.3 mg total) into the muscle once. 1 Device 4  . gabapentin (NEURONTIN) 600 MG tablet Take 1 tablet (600 mg total) by mouth daily. 90 tablet 3  . hydrocortisone (PROCTOZONE-HC) 2.5 % rectal cream Apply as needed as directed 30 g 11  . lactase (LACTAID) 3000 UNITS tablet Take 3 tablets by mouth as needed. 3-4 tabs with dairy liquids and  solids    . lipase/protease/amylase (CREON) 12000 UNITS CPEP capsule Take 1 capsule (12,000 Units total) by mouth 3 (three) times daily. CREON 24000-76000-120000 U Takes with meals 270 capsule 5  . mirtazapine (REMERON SOLTAB) 30 MG disintegrating tablet Take 1 tablet (30 mg total) by mouth at bedtime. 30 tablet 5  . mometasone (NASONEX) 50 MCG/ACT nasal spray Place 2 sprays into the nose 2 (two) times daily. 102 g 3  . mometasone-formoterol (DULERA) 200-5 MCG/ACT AERO Inhale 2 puffs into the lungs 2 (two) times daily. 1 Inhaler 5  . montelukast (SINGULAIR) 10 MG tablet TAKE 1 TABLET BY MOUTH ONCE DAILY 30 tablet 11  . neomycin-bacitracin-polymyxin (NEOSPORIN) ointment Apply 1 application topically every 12 (twelve) hours. Use on/in nose as directed as needed    . nystatin (MYCOSTATIN) 100000 UNIT/ML suspension Use as directed 5 mLs (500,000 Units total) in the mouth or throat 4 (four) times daily. Reported on 03/08/2015 473 mL 3  . nystatin-triamcinolone ointment (MYCOLOG) as needed. Reported on 03/08/2015    . omalizumab (XOLAIR) 150 MG injection Inject 150 mg into the skin every 28 (twenty-eight) days. 150 SQ every 4 weeks    . Polyethyl Glycol-Propyl Glycol (SYSTANE ULTRA) 0.4-0.3 % SOLN Apply 1 drop to eye 2 (two) times daily as needed. 1-2 drops each eye every morning and at bedtime    . pravastatin (PRAVACHOL) 20 MG tablet Take 1 tablet (20 mg total) by mouth daily. 90 tablet 3  . Prenatal Vit-Sel-Fe Fum-FA (VINATE M) 27-1 MG TABS Take 1 tablet by mouth daily. 30 each 11  . Probiotic Product (ALIGN) 4 MG CAPS Take 1 tablet by mouth daily. Once a day    . propylthiouracil (PTU) 50 MG tablet take 1/2 tablet (25 mg) twice daily 30 tablet 5  . pseudoephedrine-guaifenesin (MUCINEX D) 60-600 MG 12 hr tablet Take 1 tablet by mouth every 12 (twelve) hours. 60 tablet 5  . ranitidine (ZANTAC) 300 MG tablet Take 1 tablet by mouth daily.    Marland Kitchen Respiratory Therapy Supplies (FLUTTER) DEVI Use as directed 1  each 0  . Simethicone (GAS-X EXTRA STRENGTH) 125 MG CAPS Take 2 each by mouth daily as needed. For gas relief    . [DISCONTINUED] methimazole (TAPAZOLE) 5 MG tablet Take 5 mg by mouth 3 (three) times daily.       No current facility-administered medications on file prior to visit.    Past Medical History  Diagnosis Date  . Chronic neck pain   . Allergic rhinitis   . Moderate persistent asthma     -FeV1 72% 2011, -IgE 102 2011, CT sinus Neg 2011  . GERD (gastroesophageal reflux disease)   . Hypothyroidism   . Osteoporosis     on reclast yearly  . Hiatal hernia   .  Diverticulosis   . Hemorrhoids   . Asthma   . IBS (irritable bowel syndrome)   . Complication of anesthesia     "had hard time waking up from it several times" (02/20/2012)  . Hypercholesteremia   . Pneumonia 04/2011; ~ 11/2011    "double; single" (02/20/2012)  . Exertional dyspnea   . Graves disease   . Headache(784.0)     "related to allergies; more at different times during the year" (02/20/2012)  . Arthritis     "mostly the hands" (02/20/2012)  . Fibromyalgia 11/2011  . Chronic lower back pain   . Depression     "some; don't take anything for it" (02/20/2012)  . Breast cancer ([place]) 1998    in remission  . Anxiety   . DVT (deep venous thrombosis) ([place])   . Allergy     SEASONAL  . Anemia   . Cataract     REMOVED    Past Surgical History  Procedure Laterality Date  . Cervical fusion  2003    C3-C4  . Cervical discectomy  10/2001    C5-C6  . Vesicovaginal fistula closure w/ tah  1988  . Nasal septum surgery  1980's  . Anterior and posterior repair  1990's  . Breast lumpectomy  1998    left  . Debridement tennis elbow  ?1970's    right  . Knee arthroplasty  ?1990's    "?right; w/cartilage repair" (02/20/2012)  . Carpometacarpel ([facility]) fusion of thumb with autograft from radius  ~ 2009    "both thumbs" (02/20/2012)  . Cataract extraction w/ intraocular lens  implant, bilateral  2012  . Tonsillectomy  ~ 1953    . Posterior cervical fusion/foraminotomy  2004    "failed initial fusion; rewired  anterior neck" (02/20/2012)  . Colonoscopy    . Appendectomy    . Cholecystectomy    . Hysterectomy    . Esophagogastroduodenoscopy      Family History  Problem Relation Age of Onset  . Allergies Mother   . Heart disease Mother   . Arthritis Mother   . Lung cancer Mother   . Diabetes Mother   . Allergies Father   . Heart disease Father   . Arthritis Father   . Stroke Father   . Colon cancer      Maternal half aunt/Maternal half uncle  . Colitis Daughter   . Diabetes Maternal Grandfather     Social History   Social History  . Marital Status: Divorced    Spouse Name: N/A  . Number of Children: 2  . Years of Education: college   Occupational History  . Disabled     Retired Print production planner  .     Social History Main Topics  . Smoking status: Passive Smoke Exposure - Never Smoker  . Smokeless tobacco: Never Used     Comment: Parents  . Alcohol Use: 0.0 oz/week    0 Standard drinks or equivalent per week     Comment: 02/20/2012 "couple glasses of wine/6 months"  . Drug Use: No  . Sexual Activity: No   Other Topics Concern  . None   Social History Narrative   Patient lives at home alone. Patient  divorced.    Patient has her BS degree.   Right handed.   Caffeine- sometimes coffee.      [facility]:   Born in [place], [place]. She worked as a Copywriter, advertising. She has no pets currently. She does have indoor plants.  Previously had mold in her home that was remediated. Carpet was removed.                Objective:   Physical Exam BP 142/78 mmHg  Pulse 82  Ht 5\' 2"  (1.575 m)  Wt 116 lb (52.617 kg)  BMI 21.21 kg/m2  SpO2 96% General:  Awake.No acute distress. Thin, caucasian female.  Integument:  Warm & dry. No rash on exposed skin.  HEENT:  Moist mucus membranes. No oral ulcers. Minimal nasal turbinate swelling. Cardiovascular:  Regular rate. No edema. No  appreciable JVD.  Pulmonary:  Clear bilaterally to auscultation. Symmetric chest wall expansion. Normal work of breathing on room air. Abdomen: Soft. Normal bowel sounds. Nondistended. Grossly nontender. Musculoskeletal:  Normal bulk and tone. No joint deformity or effusion appreciated.  PFT 04/29/15: FVC 2.24 L (81%) FEV1 1.95 L (92%) FEV1/FVC 0.87 FEF 25-75 2.59 L (137%) no bronchodilator response TLC 3.91 L (84%) RV 83% ERV 61% DLCO corrected 62% (hemoglobin 13.7) 04/10/12:FVC 1.90 L (69%) FEV1 1.49 L (75%) FEV1/FVC 0.78 FEF 25-75 1.39 L (59%)                                                TLC 3.55 L (80%) RV 62% ERV 52% DLCO uncorrected 72%  IMAGING CT MAXILLOFACIAL 05/21/13 (per radiologist): Negative for chronic sinus disease CT CHEST W/O 06/05/12 (per radiologist): Resolution of previous right apical ground glass. Persistent tree-in-bud nodularity within basal portion RUL.  LABS 11/09/14 BMP: 141/4.4/104/27/27/0.98/82/9.3 LFT: 4.1/6.5/0.08/17/28/31 CBC: 6.7/12.4/36.9/233  05/31/14 URINE TOX SCREEN: NEGATIVE  12/16/12 IgE: 206.6  04/01/12 RAST PANEL: RAGWEED 0.14 (class 0/1) IgE: 409.9 ASPERGILLUS ANTIGEN: 1.92  01/02/12 RAST PANEL: Weakly Pan-Positive Up to Class 1 IgE: 825.8  06/28/11 IgG: 53 IgA: 134    Assessment & Plan:  65 year old female with severe, persistent asthma, allergic rhinitis, & GERD. Patient is appearance increasing cough which is likely due to her allergic rhinitis from increasing pollen counts. Zyrtec was stopped in December and I suspect she would benefit from restarting this medication now. Reflux seems to be relatively well-controlled at this time. I do believe that her asthma is well-controlled as well given the stability in her peak flows as well as the improvement in her spirometry when compared with previous testing from 2014 which I believe is likely secondary to Xolair use. She continues to exhibit no evidence of airway obstruction or  significant bronchodilator response. Interestingly her lung volumes are normal but her carbon monoxide diffusion capacity corrected for hemoglobin is slightly decreased which is of unclear significance. I instructed the patient to contact my office if she had any new breathing problems before her next appointment as I would be happy to see her sooner.  1. Severe, persistent asthma: Continuing Singulair, Dulera, & Xolair without change. Checking spirometry with bronchodilator challenge & DLCO next appointment. Consider decreasing dose of inhaled steroid at next appointment if peak flows and spirometry remains stable. 6 minute walk test at next appointment. 2. Allergic rhinitis: Patient continuing on Xolair, Nasonex, & Singulair. Advised her to restart Zyrtec daily. She is concerned about prior nasal aspiration event and plans on scheduling an appointment with ENT for further evaluation. 3. GERD: Patient continuing on Dexilant Zantac. Follows with GI. & 4. Immunizations: Patient received influenza vaccine September 2016, Prevnar March 2015, & Pneumovax October 2013.  5. Follow-up: return  to clinic in 3 months or sooner if needed.   Sonia Baller Ashok Cordia, M.D. [facility] Pager:  [phone] After 3pm or if no response, call [phone] 11:39 AM 04/29/2015

## 2015-05-03 ENCOUNTER — Other Ambulatory Visit: Payer: Self-pay | Admitting: *Deleted

## 2015-05-03 ENCOUNTER — Telehealth: Payer: Self-pay

## 2015-05-03 ENCOUNTER — Other Ambulatory Visit: Payer: Self-pay

## 2015-05-03 MED ORDER — CYPROHEPTADINE HCL 4 MG PO TABS: ORAL_TABLET | ORAL | Status: DC

## 2015-05-03 MED ORDER — GABAPENTIN 600 MG PO TABS: 600.0000 mg | ORAL_TABLET | Freq: Every day | ORAL | Status: DC

## 2015-05-03 NOTE — Telephone Encounter (Signed)
Patient last seen in January. She went to the pharmacy to pick up cyproheptadine (PERIACTIN) 4 MG tablet and seen that it was only 30 Days instead of 60 days. Dr. Neldon Mc wants her to take it 2xs a day and a 30 day supply would only last 15 days.  Please Advise  Thanks  [facility]

## 2015-05-03 NOTE — Telephone Encounter (Signed)
Received fax requesting refill on gabapentin.   Refill appropriate and filled per protocol. 

## 2015-05-03 NOTE — Telephone Encounter (Signed)
Sent in refill for periactin qt of 60 informed pt of it.

## 2015-05-12 ENCOUNTER — Telehealth: Payer: Self-pay | Admitting: Pulmonary Disease

## 2015-05-12 NOTE — Telephone Encounter (Signed)
#   vials:1  Ordered date:05/12/15 Shipping Date:3/24 or 05/16/15

## 2015-05-13 NOTE — Telephone Encounter (Signed)
#   Vials:1 Arrival Date:05/13/2015  Lot [id number] Exp Date:12/2018

## 2015-05-23 ENCOUNTER — Ambulatory Visit: Payer: Medicare Other

## 2015-05-26 ENCOUNTER — Encounter: Payer: Self-pay | Admitting: Family Medicine

## 2015-05-26 ENCOUNTER — Ambulatory Visit (INDEPENDENT_AMBULATORY_CARE_PROVIDER_SITE_OTHER): Payer: Medicare Other | Admitting: Family Medicine

## 2015-05-26 VITALS — BP 102/50 | HR 76 | Temp 99.0°F | Resp 16 | Ht 62.0 in | Wt 112.0 lb

## 2015-05-26 DIAGNOSIS — E785 Hyperlipidemia, unspecified: Secondary | ICD-10-CM | POA: Diagnosis not present

## 2015-05-26 DIAGNOSIS — M25562 Pain in left knee: Secondary | ICD-10-CM

## 2015-05-26 NOTE — Progress Notes (Signed)
Subjective:    Patient ID: Ebony Scott, female    DOB: 1950/04/27, 65 y.o.   MRN: [id number]  HPI  She presents with one-week of severe left knee pain. The pain began shortly after she received epidural steroid injections at her pain specialist Dr. She waited a week but the pain has intensified in her left knee. Now the left knee is swollen with an effusion and also very warm to the touch. She can barely walk on it. It hurts to put weight on it. She denies any injury. Patient had similar symptoms in the past. She was hospitalized in 2014 and joint fluid analysis revealed calcium pyrophosphate crystals consistent with pseudogout. The reaction she was experiencing at that time is very much similar to the reaction she is experiencing now. There is no evidence of septic arthritis in that the patient has not had any instrumentation done to the knee. She has no symptoms of a systemic infection. There is no erythema in the knee. Past Medical History  Diagnosis Date  . Chronic neck pain   . Allergic rhinitis   . Moderate persistent asthma     -FeV1 72% 2011, -IgE 102 2011, CT sinus Neg 2011  . GERD (gastroesophageal reflux disease)   . Hypothyroidism   . Osteoporosis     on reclast yearly  . Hiatal hernia   . Diverticulosis   . Hemorrhoids   . Asthma   . IBS (irritable bowel syndrome)   . Complication of anesthesia     "had hard time waking up from it several times" (02/20/2012)  . Hypercholesteremia   . Pneumonia 04/2011; ~ 11/2011    "double; single" (02/20/2012)  . Exertional dyspnea   . Graves disease   . Headache(784.0)     "related to allergies; more at different times during the year" (02/20/2012)  . Arthritis     "mostly the hands" (02/20/2012)  . Fibromyalgia 11/2011  . Chronic lower back pain   . Depression     "some; don't take anything for it" (02/20/2012)  . Breast cancer ([place]) 1998    in remission  . Anxiety   . DVT (deep venous thrombosis) ([place])   . Allergy     SEASONAL  .  Anemia   . Cataract     REMOVED   Past Surgical History  Procedure Laterality Date  . Cervical fusion  2003    C3-C4  . Cervical discectomy  10/2001    C5-C6  . Vesicovaginal fistula closure w/ tah  1988  . Nasal septum surgery  1980's  . Anterior and posterior repair  1990's  . Breast lumpectomy  1998    left  . Debridement tennis elbow  ?1970's    right  . Knee arthroplasty  ?1990's    "?right; w/cartilage repair" (02/20/2012)  . Carpometacarpel ([facility]) fusion of thumb with autograft from radius  ~ 2009    "both thumbs" (02/20/2012)  . Cataract extraction w/ intraocular lens  implant, bilateral  2012  . Tonsillectomy  ~ 1953  . Posterior cervical fusion/foraminotomy  2004    "failed initial fusion; rewired  anterior neck" (02/20/2012)  . Colonoscopy    . Appendectomy    . Cholecystectomy    . Hysterectomy    . Esophagogastroduodenoscopy     Current Outpatient Prescriptions on File Prior to Visit  Medication Sig Dispense Refill  . acetaminophen (TYLENOL ARTHRITIS PAIN) 650 MG CR tablet Take 650 mg by mouth 2 (two) times daily.     Marland Kitchen  albuterol (PROAIR HFA) 108 (90 BASE) MCG/ACT inhaler Inhale 1-2 puffs into the lungs every 6 (six) hours as needed for wheezing or shortness of breath. 1 Inhaler 3  . Alpha-D-Galactosidase (BEANO) TABS Take 1 tablet by mouth daily.     . Alpha-Lipoic Acid 600 MG CAPS Take 600 mg by mouth daily. Once a day    . Calcium Citrate-Vitamin D (CITRACAL MAXIMUM) 315-250 MG-UNIT TABS Take 1 tablet by mouth 2 (two) times daily.     . Cholecalciferol (VITAMIN D) 2000 UNITS CAPS 1/2 at bedtime    . Ciprofloxacin-Fluocinolone (OTOVEL OT) Place in ear(s). As directed by Dr. Thornell Mule    . colchicine 0.6 MG tablet Take 1 tablet (0.6 mg total) by mouth as needed. 30 tablet 2  . cyproheptadine (PERIACTIN) 4 MG tablet Take two tablets every evening 60 tablet 3  . denosumab (PROLIA) 60 MG/ML SOLN injection Inject 60 mg into the skin every 6 (six) months. Administer in upper  arm, thigh, or abdomen    . dexlansoprazole (DEXILANT) 60 MG capsule Take 1 capsule (60 mg total) by mouth daily. 30 capsule 5  . EPINEPHrine (EPIPEN 2-PAK) 0.3 mg/0.3 mL IJ SOAJ injection Inject 0.3 mLs (0.3 mg total) into the muscle once. 1 Device 4  . gabapentin (NEURONTIN) 600 MG tablet Take 1 tablet (600 mg total) by mouth daily. 90 tablet 3  . hydrocortisone (PROCTOZONE-HC) 2.5 % rectal cream Apply as needed as directed 30 g 11  . lactase (LACTAID) 3000 UNITS tablet Take 3 tablets by mouth as needed. 3-4 tabs with dairy liquids and solids    . lipase/protease/amylase (CREON) 12000 UNITS CPEP capsule Take 1 capsule (12,000 Units total) by mouth 3 (three) times daily. CREON 24000-76000-120000 U Takes with meals 270 capsule 5  . mirtazapine (REMERON SOLTAB) 30 MG disintegrating tablet Take 1 tablet (30 mg total) by mouth at bedtime. 30 tablet 5  . mometasone (NASONEX) 50 MCG/ACT nasal spray Place 2 sprays into the nose 2 (two) times daily. 102 g 3  . mometasone-formoterol (DULERA) 200-5 MCG/ACT AERO Inhale 2 puffs into the lungs 2 (two) times daily. 1 Inhaler 5  . montelukast (SINGULAIR) 10 MG tablet TAKE 1 TABLET BY MOUTH ONCE DAILY 30 tablet 11  . neomycin-bacitracin-polymyxin (NEOSPORIN) ointment Apply 1 application topically every 12 (twelve) hours. Use on/in nose as directed as needed    . nystatin (MYCOSTATIN) 100000 UNIT/ML suspension Use as directed 5 mLs (500,000 Units total) in the mouth or throat 4 (four) times daily. Reported on 03/08/2015 473 mL 3  . nystatin-triamcinolone ointment (MYCOLOG) as needed. Reported on 03/08/2015    . omalizumab (XOLAIR) 150 MG injection Inject 150 mg into the skin every 28 (twenty-eight) days. 150 SQ every 4 weeks    . Polyethyl Glycol-Propyl Glycol (SYSTANE ULTRA) 0.4-0.3 % SOLN Apply 1 drop to eye 2 (two) times daily as needed. 1-2 drops each eye every morning and at bedtime    . pravastatin (PRAVACHOL) 20 MG tablet Take 1 tablet (20 mg total) by mouth  daily. 90 tablet 3  . Prenatal Vit-Sel-Fe Fum-FA (VINATE M) 27-1 MG TABS Take 1 tablet by mouth daily. 30 each 11  . Probiotic Product (ALIGN) 4 MG CAPS Take 1 tablet by mouth daily. Once a day    . propylthiouracil (PTU) 50 MG tablet take 1/2 tablet (25 mg) twice daily 30 tablet 5  . pseudoephedrine-guaifenesin (MUCINEX D) 60-600 MG 12 hr tablet Take 1 tablet by mouth every 12 (twelve) hours. 60 tablet 5  .  ranitidine (ZANTAC) 300 MG tablet Take 1 tablet by mouth daily.    Marland Kitchen Respiratory Therapy Supplies (FLUTTER) DEVI Use as directed 1 each 0  . Simethicone (GAS-X EXTRA STRENGTH) 125 MG CAPS Take 2 each by mouth daily as needed. For gas relief    . [DISCONTINUED] methimazole (TAPAZOLE) 5 MG tablet Take 5 mg by mouth 3 (three) times daily.       No current facility-administered medications on file prior to visit.   Allergies  Allergen Reactions  . Dust Mite Extract Shortness Of Breath and Other (See Comments)    "sneezing" (02/20/2012)  . Molds & Smuts Shortness Of Breath  . Morphine Sulfate Itching  . Other Shortness Of Breath and Other (See Comments)    Grass and weeds "sneezing; filled sinuses" (02/20/2012)  . Penicillins Rash and Other (See Comments)    "welts" (02/20/2012)  . Reclast [Zoledronic Acid]     Put in hospital  . Rofecoxib Swelling    Celebrex REACTION: feet swelling  . Shrimp Flavor Anaphylaxis    ALL SHELLFISH  . Tetracycline Hcl Nausea And Vomiting  . Dilaudid [Hydromorphone Hcl] Itching  . Hydrocodone-Acetaminophen Nausea And Vomiting  . Levofloxacin Other (See Comments)    REACTION: GI upset  . Oxycodone Hcl Nausea And Vomiting  . Paroxetine Nausea And Vomiting    Paxil   . Diltiazem Swelling  . Tree Extract Other (See Comments)    "tested and told I was allergic to it; never experienced a reaction to it" (02/20/2012)   Social History   Social History  . Marital Status: Divorced    Spouse Name: N/A  . Number of Children: 2  . Years of Education: college     Occupational History  . Disabled     Retired Print production planner  .     Social History Main Topics  . Smoking status: Passive Smoke Exposure - Never Smoker  . Smokeless tobacco: Never Used     Comment: Parents  . Alcohol Use: 0.0 oz/week    0 Standard drinks or equivalent per week     Comment: 02/20/2012 "couple glasses of wine/6 months"  . Drug Use: No  . Sexual Activity: No   Other Topics Concern  . Not on file   Social History Narrative   Patient lives at home alone. Patient  divorced.    Patient has her BS degree.   Right handed.   Caffeine- sometimes coffee.       Pulmonary:   Born in [place], [place]. She worked as a Copywriter, advertising. She has no pets currently. She does have indoor plants. Previously had mold in her home that was remediated. Carpet was removed.               Review of Systems  All other systems reviewed and are negative.      Objective:   Physical Exam  Cardiovascular: Normal rate and regular rhythm.   Pulmonary/Chest: Effort normal and breath sounds normal.  Musculoskeletal:       Left knee: She exhibits decreased range of motion, swelling and effusion. She exhibits no erythema. Tenderness found. Medial joint line and lateral joint line tenderness noted.  Vitals reviewed.         Assessment & Plan:  Left knee pain  Hyperlipidemia - Plan: COMPLETE METABOLIC PANEL WITH GFR, Lipid panel  Given the level of the patient's pain and her past medical history, I believe this is acute pseudogout. Using sterile technique, I aspirated the  left knee through an 18-gauge needle and was able to aspirate 10 mL of yellow serous fluid. This fluid will be sent for cell count, crystal analysis, and culture. Through the same needle then, I injected 2 mL of 0.1% lidocaine, 2 mL of Marcaine, and 2 mL of 40 mg per mL Kenalog. I explained to the patient that it would be dangerous to go ahead and inject her knee until we have ruled out infection.  However the patient is willing to do this because it is hard for her to come back to the clinic as she does not have transportation and I believe this is truly pseudogout. Patient tolerated the procedure well with no complication. Return fasting for a CMP and fasting lipid panel to monitor her cholesterol and her liver function test.

## 2015-05-27 ENCOUNTER — Encounter: Payer: Self-pay | Admitting: Pulmonary Disease

## 2015-05-27 LAB — SYNOVIAL CELL COUNT + DIFF, W/ CRYSTALS
Basophils, %: 0 %
Eosinophils-Synovial: 0 % (ref 0–2)
Lymphocytes-Synovial Fld: 2 % (ref 0–74)
Monocyte/Macrophage: 4 % (ref 0–69)
Neutrophil, Synovial: 94 % — ABNORMAL HIGH (ref 0–24)
Synoviocytes, %: 0 % (ref 0–15)
WBC, Synovial: 13325 cells/uL — ABNORMAL HIGH (ref <150)

## 2015-05-30 ENCOUNTER — Other Ambulatory Visit: Payer: Medicare Other

## 2015-05-30 ENCOUNTER — Ambulatory Visit (INDEPENDENT_AMBULATORY_CARE_PROVIDER_SITE_OTHER): Payer: Medicare Other

## 2015-05-30 DIAGNOSIS — J4540 Moderate persistent asthma, uncomplicated: Secondary | ICD-10-CM | POA: Diagnosis not present

## 2015-05-30 LAB — BODY FLUID CULTURE
Gram Stain: NONE SEEN
Organism ID, Bacteria: NO GROWTH

## 2015-05-31 ENCOUNTER — Encounter: Payer: Self-pay | Admitting: Family Medicine

## 2015-05-31 LAB — COMPLETE METABOLIC PANEL WITH GFR
ALT: 33 U/L — ABNORMAL HIGH (ref 6–29)
AST: 26 U/L (ref 10–35)
Albumin: 3.8 g/dL (ref 3.6–5.1)
Alkaline Phosphatase: 40 U/L (ref 33–130)
BUN: 27 mg/dL — ABNORMAL HIGH (ref 7–25)
CO2: 25 mmol/L (ref 20–31)
Calcium: 9.1 mg/dL (ref 8.6–10.4)
Chloride: 104 mmol/L (ref 98–110)
Creat: 1.09 mg/dL — ABNORMAL HIGH (ref 0.50–0.99)
GFR, Est African American: 61 mL/min (ref >=60)
GFR, Est Non African American: 53 mL/min — ABNORMAL LOW (ref >=60)
Glucose, Bld: 78 mg/dL (ref 70–99)
Potassium: 4.6 mmol/L (ref 3.5–5.3)
Sodium: 142 mmol/L (ref 135–146)
Total Bilirubin: 0.4 mg/dL (ref 0.2–1.2)
Total Protein: 6.7 g/dL (ref 6.1–8.1)

## 2015-05-31 LAB — LIPID PANEL
Cholesterol: 210 mg/dL — ABNORMAL HIGH (ref 125–200)
HDL: 101 mg/dL (ref >=46)
LDL Cholesterol: 86 mg/dL (ref <130)
Total CHOL/HDL Ratio: 2.1 Ratio (ref <=5.0)
Triglycerides: 116 mg/dL (ref <150)
VLDL: 23 mg/dL (ref <30)

## 2015-05-31 MED ORDER — OMALIZUMAB 150 MG ~~LOC~~ SOLR
150.0000 mg | Freq: Once | SUBCUTANEOUS | Status: AC
  Administered 2015-05-30: 150 mg via SUBCUTANEOUS

## 2015-06-02 ENCOUNTER — Telehealth: Payer: Self-pay

## 2015-06-02 ENCOUNTER — Other Ambulatory Visit: Payer: Self-pay | Admitting: *Deleted

## 2015-06-02 MED ORDER — PSEUDOEPHEDRINE-GUAIFENESIN ER 60-600 MG PO TB12: 1.0000 | ORAL_TABLET | Freq: Two times a day (BID) | ORAL | Status: DC

## 2015-06-02 NOTE — Telephone Encounter (Signed)
Requesting refill on her Mirtazapine, will forward to Dr Carlean Purl to advise.

## 2015-06-02 NOTE — Telephone Encounter (Signed)
Received fax requesting refill on Mucinex D.  Refill appropriate and filled per protocol.

## 2015-06-03 ENCOUNTER — Encounter: Payer: Self-pay | Admitting: Family Medicine

## 2015-06-03 ENCOUNTER — Ambulatory Visit (INDEPENDENT_AMBULATORY_CARE_PROVIDER_SITE_OTHER): Payer: Medicare Other | Admitting: Family Medicine

## 2015-06-03 VITALS — BP 110/68 | HR 92 | Temp 100.1°F | Resp 18 | Ht 62.0 in

## 2015-06-03 DIAGNOSIS — R509 Fever, unspecified: Secondary | ICD-10-CM

## 2015-06-03 LAB — COMPLETE METABOLIC PANEL WITH GFR
ALT: 35 U/L — ABNORMAL HIGH (ref 6–29)
AST: 26 U/L (ref 10–35)
Albumin: 3.7 g/dL (ref 3.6–5.1)
Alkaline Phosphatase: 43 U/L (ref 33–130)
BUN: 21 mg/dL (ref 7–25)
CO2: 26 mmol/L (ref 20–31)
Calcium: 9.3 mg/dL (ref 8.6–10.4)
Chloride: 101 mmol/L (ref 98–110)
Creat: 1.15 mg/dL — ABNORMAL HIGH (ref 0.50–0.99)
GFR, Est African American: 57 mL/min — ABNORMAL LOW (ref >=60)
GFR, Est Non African American: 49 mL/min — ABNORMAL LOW (ref >=60)
Glucose, Bld: 102 mg/dL — ABNORMAL HIGH (ref 70–99)
Potassium: 4 mmol/L (ref 3.5–5.3)
Sodium: 138 mmol/L (ref 135–146)
Total Bilirubin: 0.9 mg/dL (ref 0.2–1.2)
Total Protein: 6.4 g/dL (ref 6.1–8.1)

## 2015-06-03 LAB — CBC
HCT: 33.2 % — ABNORMAL LOW (ref 35.0–45.0)
Hemoglobin: 11.1 g/dL — ABNORMAL LOW (ref 12.0–15.0)
MCH: 33.2 pg — ABNORMAL HIGH (ref 27.0–33.0)
MCHC: 33.4 g/dL (ref 32.0–36.0)
MCV: 99.4 fL (ref 80.0–100.0)
Platelets: 279 10*3/uL (ref 140–400)
RBC: 3.34 MIL/uL — ABNORMAL LOW (ref 3.80–5.10)
RDW: 13.1 % (ref 11.0–15.0)
WBC: 16.4 10*3/uL — ABNORMAL HIGH (ref 3.8–10.8)

## 2015-06-03 MED ORDER — DOXYCYCLINE HYCLATE 100 MG PO TABS: 100.0000 mg | ORAL_TABLET | Freq: Two times a day (BID) | ORAL | Status: DC

## 2015-06-03 MED ORDER — METHYLPREDNISOLONE ACETATE 40 MG/ML IJ SUSP
60.0000 mg | Freq: Once | INTRAMUSCULAR | Status: AC
  Administered 2015-06-03: 60 mg via INTRAMUSCULAR

## 2015-06-03 MED ORDER — PREDNISONE 20 MG PO TABS: ORAL_TABLET | ORAL | Status: DC

## 2015-06-03 NOTE — Progress Notes (Signed)
Subjective:    Patient ID: Ebony Scott, female    DOB: October 22, 1950, 65 y.o.   MRN: [id number]  HPI  05/26/15 She presents with one-week of severe left knee pain. The pain began shortly after she received epidural steroid injections at her pain specialist. She waited a week but the pain has intensified in her left knee. Now the left knee is swollen with an effusion and also very warm to the touch. She can barely walk on it. It hurts to put weight on it. She denies any injury. Patient had similar symptoms in the past. She was hospitalized in 2014 and joint fluid analysis revealed calcium pyrophosphate crystals consistent with pseudogout. The reaction she was experiencing at that time is very much similar to the reaction she is experiencing now. There is no evidence of septic arthritis in that the patient has not had any instrumentation done to the knee. She has no symptoms of a systemic infection. There is no erythema in the knee.  At that time, my plan was: Given the level of the patient's pain and her past medical history, I believe this is acute pseudogout. Using sterile technique, I aspirated the left knee through an 18-gauge needle and was able to aspirate 10 mL of yellow serous fluid. This fluid will be sent for cell count, crystal analysis, and culture. Through the same needle then, I injected 2 mL of 0.1% lidocaine, 2 mL of Marcaine, and 2 mL of 40 mg per mL Kenalog. I explained to the patient that it would be dangerous to go ahead and inject her knee until we have ruled out infection. However the patient is willing to do this because it is hard for her to come back to the clinic as she does not have transportation and I believe this is truly pseudogout. Patient tolerated the procedure well with no complication. Return fasting for a CMP and fasting lipid panel to monitor her cholesterol and her liver function test.  06/03/15 Culture and graim stain were negative for septic arthritis.    Patient states  that her left knee became much better after the cortisone shot.  however 2 days ago, the patient developed diffuse pain all over her body particularly in her neck and shoulders and lower back and tailbone. She is also now hurting in her knees and her ankles. She has a low-grade fever to 100.1. She denies any recent tick bites. She denies any rashes. She does have normal range of motion in her neck but it is sore and painful. She has had a similar systemic reaction to pseudogout in the past. Please see the hospitalization from 2014 area and however I am also concerned about systemic illness such as possible tickborne illness as well as complications from epidural steroid injections. Clinically she does not appear to have meningitis and she does not appear toxic. I believe the likelihood of meningitis secondary to an epidural steroid injection is highly unlikely given her presentation. Suspect pseudogout particularly given the reaction the patient had to the shot in her knee. Polymyalgia rheumatica is also a possibility Past Medical History  Diagnosis Date  . Chronic neck pain   . Allergic rhinitis   . Moderate persistent asthma     -FeV1 72% 2011, -IgE 102 2011, CT sinus Neg 2011  . GERD (gastroesophageal reflux disease)   . Hypothyroidism   . Osteoporosis     on reclast yearly  . Hiatal hernia   . Diverticulosis   . Hemorrhoids   .  Asthma   . IBS (irritable bowel syndrome)   . Complication of anesthesia     "had hard time waking up from it several times" (02/20/2012)  . Hypercholesteremia   . Pneumonia 04/2011; ~ 11/2011    "double; single" (02/20/2012)  . Exertional dyspnea   . Graves disease   . Headache(784.0)     "related to allergies; more at different times during the year" (02/20/2012)  . Arthritis     "mostly the hands" (02/20/2012)  . Fibromyalgia 11/2011  . Chronic lower back pain   . Depression     "some; don't take anything for it" (02/20/2012)  . Breast cancer ([facility]) 1998    in  remission  . Anxiety   . DVT (deep venous thrombosis) ([employer])   . Allergy     SEASONAL  . Anemia   . Cataract     REMOVED   Past Surgical History  Procedure Laterality Date  . Cervical fusion  2003    C3-C4  . Cervical discectomy  10/2001    C5-C6  . Vesicovaginal fistula closure w/ tah  1988  . Nasal septum surgery  1980's  . Anterior and posterior repair  1990's  . Breast lumpectomy  1998    left  . Debridement tennis elbow  ?1970's    right  . Knee arthroplasty  ?1990's    "?right; w/cartilage repair" (02/20/2012)  . Carpometacarpel ([facility]) fusion of thumb with autograft from radius  ~ 2009    "both thumbs" (02/20/2012)  . Cataract extraction w/ intraocular lens  implant, bilateral  2012  . Tonsillectomy  ~ 1953  . Posterior cervical fusion/foraminotomy  2004    "failed initial fusion; rewired  anterior neck" (02/20/2012)  . Colonoscopy    . Appendectomy    . Cholecystectomy    . Hysterectomy    . Esophagogastroduodenoscopy     Current Outpatient Prescriptions on File Prior to Visit  Medication Sig Dispense Refill  . acetaminophen (TYLENOL ARTHRITIS PAIN) 650 MG CR tablet Take 650 mg by mouth 2 (two) times daily.     Marland Kitchen albuterol (PROAIR HFA) 108 (90 BASE) MCG/ACT inhaler Inhale 1-2 puffs into the lungs every 6 (six) hours as needed for wheezing or shortness of breath. 1 Inhaler 3  . Alpha-D-Galactosidase (BEANO) TABS Take 1 tablet by mouth daily.     . Alpha-Lipoic Acid 600 MG CAPS Take 600 mg by mouth daily. Once a day    . Calcium Citrate-Vitamin D (CITRACAL MAXIMUM) 315-250 MG-UNIT TABS Take 1 tablet by mouth 2 (two) times daily.     . Cholecalciferol (VITAMIN D) 2000 UNITS CAPS 1/2 at bedtime    . Ciprofloxacin-Fluocinolone (OTOVEL OT) Place in ear(s). As directed by Dr. Thornell Mule    . colchicine 0.6 MG tablet Take 1 tablet (0.6 mg total) by mouth as needed. 30 tablet 2  . cyproheptadine (PERIACTIN) 4 MG tablet Take two tablets every evening 60 tablet 3  . denosumab (PROLIA)  60 MG/ML SOLN injection Inject 60 mg into the skin every 6 (six) months. Administer in upper arm, thigh, or abdomen    . dexlansoprazole (DEXILANT) 60 MG capsule Take 1 capsule (60 mg total) by mouth daily. 30 capsule 5  . EPINEPHrine (EPIPEN 2-PAK) 0.3 mg/0.3 mL IJ SOAJ injection Inject 0.3 mLs (0.3 mg total) into the muscle once. 1 Device 4  . gabapentin (NEURONTIN) 600 MG tablet Take 1 tablet (600 mg total) by mouth daily. 90 tablet 3  . hydrocortisone (PROCTOZONE-HC) 2.5 %  rectal cream Apply as needed as directed 30 g 11  . lactase (LACTAID) 3000 UNITS tablet Take 3 tablets by mouth as needed. 3-4 tabs with dairy liquids and solids    . lipase/protease/amylase (CREON) 12000 UNITS CPEP capsule Take 1 capsule (12,000 Units total) by mouth 3 (three) times daily. CREON 24000-76000-120000 U Takes with meals 270 capsule 5  . mirtazapine (REMERON SOLTAB) 30 MG disintegrating tablet Take 1 tablet (30 mg total) by mouth at bedtime. 30 tablet 5  . mometasone (NASONEX) 50 MCG/ACT nasal spray Place 2 sprays into the nose 2 (two) times daily. 102 g 3  . mometasone-formoterol (DULERA) 200-5 MCG/ACT AERO Inhale 2 puffs into the lungs 2 (two) times daily. 1 Inhaler 5  . montelukast (SINGULAIR) 10 MG tablet TAKE 1 TABLET BY MOUTH ONCE DAILY 30 tablet 11  . neomycin-bacitracin-polymyxin (NEOSPORIN) ointment Apply 1 application topically every 12 (twelve) hours. Use on/in nose as directed as needed    . nystatin (MYCOSTATIN) 100000 UNIT/ML suspension Use as directed 5 mLs (500,000 Units total) in the mouth or throat 4 (four) times daily. Reported on 03/08/2015 473 mL 3  . nystatin-triamcinolone ointment (MYCOLOG) as needed. Reported on 03/08/2015    . omalizumab (XOLAIR) 150 MG injection Inject 150 mg into the skin every 28 (twenty-eight) days. 150 SQ every 4 weeks    . Polyethyl Glycol-Propyl Glycol (SYSTANE ULTRA) 0.4-0.3 % SOLN Apply 1 drop to eye 2 (two) times daily as needed. 1-2 drops each eye every morning and  at bedtime    . pravastatin (PRAVACHOL) 20 MG tablet Take 1 tablet (20 mg total) by mouth daily. 90 tablet 3  . Prenatal Vit-Sel-Fe Fum-FA (VINATE M) 27-1 MG TABS Take 1 tablet by mouth daily. 30 each 11  . Probiotic Product (ALIGN) 4 MG CAPS Take 1 tablet by mouth daily. Once a day    . propylthiouracil (PTU) 50 MG tablet take 1/2 tablet (25 mg) twice daily 30 tablet 5  . pseudoephedrine-guaifenesin (MUCINEX D) 60-600 MG 12 hr tablet Take 1 tablet by mouth every 12 (twelve) hours. 60 tablet 5  . ranitidine (ZANTAC) 300 MG tablet Take 1 tablet by mouth daily.    Marland Kitchen Respiratory Therapy Supplies (FLUTTER) DEVI Use as directed 1 each 0  . Simethicone (GAS-X EXTRA STRENGTH) 125 MG CAPS Take 2 each by mouth daily as needed. For gas relief    . [DISCONTINUED] methimazole (TAPAZOLE) 5 MG tablet Take 5 mg by mouth 3 (three) times daily.       No current facility-administered medications on file prior to visit.   Allergies  Allergen Reactions  . Dust Mite Extract Shortness Of Breath and Other (See Comments)    "sneezing" (02/20/2012)  . Molds & Smuts Shortness Of Breath  . Morphine Sulfate Itching  . Other Shortness Of Breath and Other (See Comments)    Grass and weeds "sneezing; filled sinuses" (02/20/2012)  . Penicillins Rash and Other (See Comments)    "welts" (02/20/2012)  . Reclast [Zoledronic Acid]     Put in hospital  . Rofecoxib Swelling    Celebrex REACTION: feet swelling  . Shrimp Flavor Anaphylaxis    ALL SHELLFISH  . Tetracycline Hcl Nausea And Vomiting  . Dilaudid [Hydromorphone Hcl] Itching  . Hydrocodone-Acetaminophen Nausea And Vomiting  . Levofloxacin Other (See Comments)    REACTION: GI upset  . Oxycodone Hcl Nausea And Vomiting  . Paroxetine Nausea And Vomiting    Paxil   . Diltiazem Swelling  . Tree Extract Other (  See Comments)    "tested and told I was allergic to it; never experienced a reaction to it" (02/20/2012)   Social History   Social History  . Marital  Status: Divorced    Spouse Name: N/A  . Number of Children: 2  . Years of Education: college   Occupational History  . Disabled     Retired Print production planner  .     Social History Main Topics  . Smoking status: Passive Smoke Exposure - Never Smoker  . Smokeless tobacco: Never Used     Comment: Parents  . Alcohol Use: 0.0 oz/week    0 Standard drinks or equivalent per week     Comment: 02/20/2012 "couple glasses of wine/6 months"  . Drug Use: No  . Sexual Activity: No   Other Topics Concern  . Not on file   Social History Narrative   Patient lives at home alone. Patient  divorced.    Patient has her BS degree.   Right handed.   Caffeine- sometimes coffee.      [facility]:   Born in [place], [place]. She worked as a Copywriter, advertising. She has no pets currently. She does have indoor plants. Previously had mold in her home that was remediated. Carpet was removed.               Review of Systems  All other systems reviewed and are negative.      Objective:   Physical Exam  Cardiovascular: Normal rate and regular rhythm.   Pulmonary/Chest: Effort normal and breath sounds normal.  Musculoskeletal:       Right shoulder: She exhibits decreased range of motion.       Left shoulder: She exhibits decreased range of motion.       Right knee: She exhibits no erythema. Tenderness found. Medial joint line and lateral joint line tenderness noted.       Left knee: She exhibits decreased range of motion. She exhibits no swelling, no effusion and no erythema. Tenderness found. Medial joint line and lateral joint line tenderness noted.       Right ankle: She exhibits decreased range of motion.       Left ankle: She exhibits decreased range of motion.       Cervical back: She exhibits decreased range of motion and tenderness.       Lumbar back: She exhibits decreased range of motion and pain. She exhibits no bony tenderness and no spasm.  Vitals reviewed.           Assessment & Plan:  Fever, unspecified - Plan: COMPLETE METABOLIC PANEL WITH GFR, B. burgdorfi antibodies by WB, [place] spotted fvr abs pnl(IgG+IgM), CBC w/[facility] & 3 Part Diff, Culture, blood (single), Culture, blood (single)  Ultimately we are going to have to make a decision based on incomplete information. The question is whether this is pseudogout similar to what she experienced in 2014 which would benefit from systemic steroids, polymyalgia rheumatica which would benefit from systemic steroids, or some type of systemic infection such as a tickborne illness versus bacteremia. I will check blood cultures today. I will check Lyme titers along with [facility] spotted fever titers. I asked pointed the patient that this will not be back in any time frame to help [place] make a decision. I will obtain a stat CBC. If her white blood cell count is elevated, I would recommend ER evaluation and imaging of the involved joints to evaluate for possible possible  signs of osteomyelitis and bacteremia. If her white blood cell count is normal, I would suggest trying systemic steroids over the weekend with a low threshold to go to the hospital should her symptoms worsen for presumptive treatment of pseudogout  White blood cell count is elevated at 16. We discussed the differential diagnosis including bacteremia with developing osteomyelitis versus tickborne illness versus pseudogout. Clinically the patient does not appear toxic or sick. I truly believe them a hard this is pseudogout. Therefore I started the patient on a prednisone taper pack in addition to doxycycline 100 mg by mouth twice a day until we receive the tickborne titers back. Patient is aware of the risk of doing this including worsening of a systemic infection. She will go the hospital immediately over the weekend if she worsens. However she is in agreement to try prednisone and she also agrees this feels like her previous systemic pseudogout.

## 2015-06-03 NOTE — Addendum Note (Signed)
Addended by: Haskel Khan A on: 06/03/2015 03:58 PM   Modules accepted: Orders

## 2015-06-06 ENCOUNTER — Encounter: Payer: Self-pay | Admitting: Family Medicine

## 2015-06-06 ENCOUNTER — Ambulatory Visit (INDEPENDENT_AMBULATORY_CARE_PROVIDER_SITE_OTHER): Payer: Medicare Other | Admitting: Family Medicine

## 2015-06-06 VITALS — BP 130/60 | HR 78 | Temp 98.4°F | Resp 18 | Ht 62.0 in | Wt 116.0 lb

## 2015-06-06 DIAGNOSIS — M1120 Other chondrocalcinosis, unspecified site: Secondary | ICD-10-CM

## 2015-06-06 DIAGNOSIS — M1180 Other specified crystal arthropathies, unspecified site: Secondary | ICD-10-CM | POA: Diagnosis not present

## 2015-06-06 LAB — ROCKY MTN SPOTTED FVR ABS PNL(IGG+IGM)
RMSF IgG: NOT DETECTED
RMSF IgM: NOT DETECTED

## 2015-06-06 MED ORDER — MIRTAZAPINE 30 MG PO TBDP: 30.0000 mg | ORAL_TABLET | Freq: Every day | ORAL | Status: DC

## 2015-06-06 NOTE — Progress Notes (Signed)
Subjective:    Patient ID: Ebony Scott, female    DOB: 1950/07/23, 65 y.o.   MRN: [id number]  HPI  05/26/15 She presents with one-week of severe left knee pain. The pain began shortly after she received epidural steroid injections at her pain specialist. She waited a week but the pain has intensified in her left knee. Now the left knee is swollen with an effusion and also very warm to the touch. She can barely walk on it. It hurts to put weight on it. She denies any injury. Patient had similar symptoms in the past. She was hospitalized in 2014 and joint fluid analysis revealed calcium pyrophosphate crystals consistent with pseudogout. The reaction she was experiencing at that time is very much similar to the reaction she is experiencing now. There is no evidence of septic arthritis in that the patient has not had any instrumentation done to the knee. She has no symptoms of a systemic infection. There is no erythema in the knee.  At that time, my plan was: Given the level of the patient's pain and her past medical history, I believe this is acute pseudogout. Using sterile technique, I aspirated the left knee through an 18-gauge needle and was able to aspirate 10 mL of yellow serous fluid. This fluid will be sent for cell count, crystal analysis, and culture. Through the same needle then, I injected 2 mL of 0.1% lidocaine, 2 mL of Marcaine, and 2 mL of 40 mg per mL Kenalog. I explained to the patient that it would be dangerous to go ahead and inject her knee until we have ruled out infection. However the patient is willing to do this because it is hard for her to come back to the clinic as she does not have transportation and I believe this is truly pseudogout. Patient tolerated the procedure well with no complication. Return fasting for a CMP and fasting lipid panel to monitor her cholesterol and her liver function test.  06/03/15 Culture and graim stain were negative for septic arthritis.    Patient states  that her left knee became much better after the cortisone shot.  however 2 days ago, the patient developed diffuse pain all over her body particularly in her neck and shoulders and lower back and tailbone. She is also now hurting in her knees and her ankles. She has a low-grade fever to 100.1. She denies any recent tick bites. She denies any rashes. She does have normal range of motion in her neck but it is sore and painful. She has had a similar systemic reaction to pseudogout in the past. Please see the hospitalization from 2014 area and however I am also concerned about systemic illness such as possible tickborne illness as well as complications from epidural steroid injections. Clinically she does not appear to have meningitis and she does not appear toxic. I believe the likelihood of meningitis secondary to an epidural steroid injection is highly unlikely given her presentation. Suspect pseudogout particularly given the reaction the patient had to the shot in her knee. Polymyalgia rheumatica is also a possibility.  At that time, my plan was: Ultimately we are going to have to make a decision based on incomplete information. The question is whether this is pseudogout similar to what she experienced in 2014 which would benefit from systemic steroids, polymyalgia rheumatica which would benefit from systemic steroids, or some type of systemic infection such as a tickborne illness versus bacteremia. I will check blood cultures today. I will  check Lyme titers along with [facility] spotted fever titers. I asked pointed the patient that this will not be back in any time frame to help [place] make a decision. I will obtain a stat CBC. If her white blood cell count is elevated, I would recommend ER evaluation and imaging of the involved joints to evaluate for possible possible signs of osteomyelitis and bacteremia. If her white blood cell count is normal, I would suggest trying systemic steroids over the weekend with a  low threshold to go to the hospital should her symptoms worsen for presumptive treatment of pseudogout  White blood cell count is elevated at 16. We discussed the differential diagnosis including bacteremia with developing osteomyelitis versus tickborne illness versus pseudogout. Clinically the patient does not appear toxic or sick. I truly believe them a hard this is pseudogout. Therefore I started the patient on a prednisone taper pack in addition to doxycycline 100 mg by mouth twice a day until we receive the tickborne titers back. Patient is aware of the risk of doing this including worsening of a systemic infection. She will go the hospital immediately over the weekend if she worsens. However she is in agreement to try prednisone and she also agrees this feels like her previous systemic pseudogout.  06/06/15 Thankfully, the patient is almost 100% better. Blood cultures are negative. Tickborne titers are still pending. Past Medical History  Diagnosis Date  . Chronic neck pain   . Allergic rhinitis   . Moderate persistent asthma     -FeV1 72% 2011, -IgE 102 2011, CT sinus Neg 2011  . GERD (gastroesophageal reflux disease)   . Hypothyroidism   . Osteoporosis     on reclast yearly  . Hiatal hernia   . Diverticulosis   . Hemorrhoids   . Asthma   . IBS (irritable bowel syndrome)   . Complication of anesthesia     "had hard time waking up from it several times" (02/20/2012)  . Hypercholesteremia   . Pneumonia 04/2011; ~ 11/2011    "double; single" (02/20/2012)  . Exertional dyspnea   . Graves disease   . Headache(784.0)     "related to allergies; more at different times during the year" (02/20/2012)  . Arthritis     "mostly the hands" (02/20/2012)  . Fibromyalgia 11/2011  . Chronic lower back pain   . Depression     "some; don't take anything for it" (02/20/2012)  . Breast cancer ([place]) 1998    in remission  . Anxiety   . DVT (deep venous thrombosis) ([place])   . Allergy     SEASONAL  .  Anemia   . Cataract     REMOVED   Past Surgical History  Procedure Laterality Date  . Cervical fusion  2003    C3-C4  . Cervical discectomy  10/2001    C5-C6  . Vesicovaginal fistula closure w/ tah  1988  . Nasal septum surgery  1980's  . Anterior and posterior repair  1990's  . Breast lumpectomy  1998    left  . Debridement tennis elbow  ?1970's    right  . Knee arthroplasty  ?1990's    "?right; w/cartilage repair" (02/20/2012)  . Carpometacarpel ([facility]) fusion of thumb with autograft from radius  ~ 2009    "both thumbs" (02/20/2012)  . Cataract extraction w/ intraocular lens  implant, bilateral  2012  . Tonsillectomy  ~ 1953  . Posterior cervical fusion/foraminotomy  2004    "failed initial fusion; rewired  anterior neck" (02/20/2012)  . Colonoscopy    . Appendectomy    . Cholecystectomy    . Hysterectomy    . Esophagogastroduodenoscopy     Current Outpatient Prescriptions on File Prior to Visit  Medication Sig Dispense Refill  . acetaminophen (TYLENOL ARTHRITIS PAIN) 650 MG CR tablet Take 650 mg by mouth 2 (two) times daily.     Marland Kitchen albuterol (PROAIR HFA) 108 (90 BASE) MCG/ACT inhaler Inhale 1-2 puffs into the lungs every 6 (six) hours as needed for wheezing or shortness of breath. 1 Inhaler 3  . Alpha-D-Galactosidase (BEANO) TABS Take 1 tablet by mouth daily.     . Alpha-Lipoic Acid 600 MG CAPS Take 600 mg by mouth daily. Once a day    . Calcium Citrate-Vitamin D (CITRACAL MAXIMUM) 315-250 MG-UNIT TABS Take 1 tablet by mouth 2 (two) times daily.     . Cholecalciferol (VITAMIN D) 2000 UNITS CAPS 1/2 at bedtime    . Ciprofloxacin-Fluocinolone (OTOVEL OT) Place in ear(s). As directed by Dr. Thornell Mule    . colchicine 0.6 MG tablet Take 1 tablet (0.6 mg total) by mouth as needed. 30 tablet 2  . cyproheptadine (PERIACTIN) 4 MG tablet Take two tablets every evening 60 tablet 3  . denosumab (PROLIA) 60 MG/ML SOLN injection Inject 60 mg into the skin every 6 (six) months. Administer in upper  arm, thigh, or abdomen    . dexlansoprazole (DEXILANT) 60 MG capsule Take 1 capsule (60 mg total) by mouth daily. 30 capsule 5  . doxycycline (VIBRA-TABS) 100 MG tablet Take 1 tablet (100 mg total) by mouth 2 (two) times daily. 20 tablet 0  . EPINEPHrine (EPIPEN 2-PAK) 0.3 mg/0.3 mL IJ SOAJ injection Inject 0.3 mLs (0.3 mg total) into the muscle once. 1 Device 4  . gabapentin (NEURONTIN) 600 MG tablet Take 1 tablet (600 mg total) by mouth daily. 90 tablet 3  . hydrocortisone (PROCTOZONE-HC) 2.5 % rectal cream Apply as needed as directed 30 g 11  . lactase (LACTAID) 3000 UNITS tablet Take 3 tablets by mouth as needed. 3-4 tabs with dairy liquids and solids    . lipase/protease/amylase (CREON) 12000 UNITS CPEP capsule Take 1 capsule (12,000 Units total) by mouth 3 (three) times daily. CREON 24000-76000-120000 U Takes with meals 270 capsule 5  . mirtazapine (REMERON SOLTAB) 30 MG disintegrating tablet Take 1 tablet (30 mg total) by mouth at bedtime. 30 tablet 11  . mometasone (NASONEX) 50 MCG/ACT nasal spray Place 2 sprays into the nose 2 (two) times daily. 102 g 3  . mometasone-formoterol (DULERA) 200-5 MCG/ACT AERO Inhale 2 puffs into the lungs 2 (two) times daily. 1 Inhaler 5  . montelukast (SINGULAIR) 10 MG tablet TAKE 1 TABLET BY MOUTH ONCE DAILY 30 tablet 11  . neomycin-bacitracin-polymyxin (NEOSPORIN) ointment Apply 1 application topically every 12 (twelve) hours. Use on/in nose as directed as needed    . nystatin (MYCOSTATIN) 100000 UNIT/ML suspension Use as directed 5 mLs (500,000 Units total) in the mouth or throat 4 (four) times daily. Reported on 03/08/2015 473 mL 3  . nystatin-triamcinolone ointment (MYCOLOG) as needed. Reported on 03/08/2015    . omalizumab (XOLAIR) 150 MG injection Inject 150 mg into the skin every 28 (twenty-eight) days. 150 SQ every 4 weeks    . Polyethyl Glycol-Propyl Glycol (SYSTANE ULTRA) 0.4-0.3 % SOLN Apply 1 drop to eye 2 (two) times daily as needed. 1-2 drops each  eye every morning and at bedtime    . pravastatin (PRAVACHOL) 20 MG tablet Take  1 tablet (20 mg total) by mouth daily. 90 tablet 3  . predniSONE (DELTASONE) 20 MG tablet 3 tabs poqday 1-2, 2 tabs poqday 3-4, 1 tab poqday 5-6 12 tablet 0  . Prenatal Vit-Sel-Fe Fum-FA (VINATE M) 27-1 MG TABS Take 1 tablet by mouth daily. 30 each 11  . Probiotic Product (ALIGN) 4 MG CAPS Take 1 tablet by mouth daily. Once a day    . propylthiouracil (PTU) 50 MG tablet take 1/2 tablet (25 mg) twice daily 30 tablet 5  . pseudoephedrine-guaifenesin (MUCINEX D) 60-600 MG 12 hr tablet Take 1 tablet by mouth every 12 (twelve) hours. 60 tablet 5  . ranitidine (ZANTAC) 300 MG tablet Take 1 tablet by mouth daily.    Marland Kitchen Respiratory Therapy Supplies (FLUTTER) DEVI Use as directed 1 each 0  . Simethicone (GAS-X EXTRA STRENGTH) 125 MG CAPS Take 2 each by mouth daily as needed. For gas relief    . [DISCONTINUED] methimazole (TAPAZOLE) 5 MG tablet Take 5 mg by mouth 3 (three) times daily.       No current facility-administered medications on file prior to visit.   Allergies  Allergen Reactions  . Dust Mite Extract Shortness Of Breath and Other (See Comments)    "sneezing" (02/20/2012)  . Molds & Smuts Shortness Of Breath  . Morphine Sulfate Itching  . Other Shortness Of Breath and Other (See Comments)    Grass and weeds "sneezing; filled sinuses" (02/20/2012)  . Penicillins Rash and Other (See Comments)    "welts" (02/20/2012)  . Reclast [Zoledronic Acid]     Put in hospital  . Rofecoxib Swelling    Celebrex REACTION: feet swelling  . Shrimp Flavor Anaphylaxis    ALL SHELLFISH  . Tetracycline Hcl Nausea And Vomiting  . Dilaudid [Hydromorphone Hcl] Itching  . Hydrocodone-Acetaminophen Nausea And Vomiting  . Levofloxacin Other (See Comments)    REACTION: GI upset  . Oxycodone Hcl Nausea And Vomiting  . Paroxetine Nausea And Vomiting    Paxil   . Diltiazem Swelling  . Tree Extract Other (See Comments)    "tested and  told I was allergic to it; never experienced a reaction to it" (02/20/2012)   Social History   Social History  . Marital Status: Divorced    Spouse Name: N/A  . Number of Children: 2  . Years of Education: college   Occupational History  . Disabled     Retired Print production planner  .     Social History Main Topics  . Smoking status: Passive Smoke Exposure - Never Smoker  . Smokeless tobacco: Never Used     Comment: Parents  . Alcohol Use: 0.0 oz/week    0 Standard drinks or equivalent per week     Comment: 02/20/2012 "couple glasses of wine/6 months"  . Drug Use: No  . Sexual Activity: No   Other Topics Concern  . Not on file   Social History Narrative   Patient lives at home alone. Patient  divorced.    Patient has her BS degree.   Right handed.   Caffeine- sometimes coffee.      [facility]:   Born in [place], [place]. She worked as a Copywriter, advertising. She has no pets currently. She does have indoor plants. Previously had mold in her home that was remediated. Carpet was removed.               Review of Systems  All other systems reviewed and are negative.  Objective:   Physical Exam  Cardiovascular: Normal rate and regular rhythm.   Pulmonary/Chest: Effort normal and breath sounds normal.  Musculoskeletal:       Right shoulder: She exhibits normal range of motion.       Left shoulder: She exhibits normal range of motion.       Right knee: She exhibits no erythema. No tenderness found. No medial joint line and no lateral joint line tenderness noted.       Left knee: She exhibits normal range of motion, no swelling, no effusion and no erythema. No tenderness found. No medial joint line and no lateral joint line tenderness noted.       Right ankle: She exhibits normal range of motion.       Left ankle: She exhibits normal range of motion.  Vitals reviewed.         Assessment & Plan:  Patient appears to be having an acute flare of  pseudogout which is now essentially resolved. Complete prednisone. Begin colchicine 0.6 mg by mouth daily for the next 2 weeks and then discontinue colchicine if pain does not return. Continue doxycycline until tick borne titers have returned however I do not believe this is likely the cause

## 2015-06-06 NOTE — Telephone Encounter (Signed)
Refill x 1 year 

## 2015-06-07 ENCOUNTER — Encounter: Payer: Self-pay | Admitting: Allergy and Immunology

## 2015-06-07 ENCOUNTER — Ambulatory Visit (INDEPENDENT_AMBULATORY_CARE_PROVIDER_SITE_OTHER): Payer: Medicare Other | Admitting: Allergy and Immunology

## 2015-06-07 VITALS — BP 140/70 | HR 80 | Resp 18

## 2015-06-07 DIAGNOSIS — J4550 Severe persistent asthma, uncomplicated: Secondary | ICD-10-CM

## 2015-06-07 DIAGNOSIS — J3089 Other allergic rhinitis: Secondary | ICD-10-CM | POA: Diagnosis not present

## 2015-06-07 DIAGNOSIS — G500 Trigeminal neuralgia: Secondary | ICD-10-CM

## 2015-06-07 DIAGNOSIS — J387 Other diseases of larynx: Secondary | ICD-10-CM | POA: Diagnosis not present

## 2015-06-07 DIAGNOSIS — G518 Other disorders of facial nerve: Secondary | ICD-10-CM | POA: Diagnosis not present

## 2015-06-07 DIAGNOSIS — K219 Gastro-esophageal reflux disease without esophagitis: Secondary | ICD-10-CM

## 2015-06-07 LAB — LYME ABY, WSTRN BLT IGG & IGM W/BANDS
B burgdorferi IgG Abs (IB): NEGATIVE
B burgdorferi IgM Abs (IB): NEGATIVE
Lyme Disease 18 kD IgG: NONREACTIVE
Lyme Disease 23 kD IgG: NONREACTIVE
Lyme Disease 23 kD IgM: NONREACTIVE
Lyme Disease 28 kD IgG: NONREACTIVE
Lyme Disease 30 kD IgG: NONREACTIVE
Lyme Disease 39 kD IgG: NONREACTIVE
Lyme Disease 39 kD IgM: NONREACTIVE
Lyme Disease 41 kD IgG: NONREACTIVE
Lyme Disease 41 kD IgM: NONREACTIVE
Lyme Disease 45 kD IgG: NONREACTIVE
Lyme Disease 58 kD IgG: NONREACTIVE
Lyme Disease 66 kD IgG: NONREACTIVE
Lyme Disease 93 kD IgG: NONREACTIVE

## 2015-06-07 NOTE — Progress Notes (Signed)
Follow-up Note  Referring Provider: [name], MD Primary Provider: Odette Fraction, MD Date of Office Visit: 06/07/2015  Subjective:   Ebony Scott (DOB: 05/30/1950) is a 65 y.o. female who returns to the [facility] on 06/07/2015 in re-evaluation of the following:  HPI: Ebony Scott returns to this clinic in reevaluation of her multiple issues revolving around her respiratory tract and GI tract. I last saw her in this clinic on 03/08/2015.  Her sleeping has been much better while consistently using cyproheptadine 8 mg at bedtime. As well, the issues revolving around her face and sinus pain are much better while utilizing this dose of cyproheptadine. She still occasionally has some aching affecting the left side of her face overriding her left maxillary sinus cavity but it is certainly better on this medication.  She has had some issues with nasal congestion and some sneezing and some occasional rhinorrhea for which Dr. Ashok Cordia gave her Zyrtec but a full 10 mg of Zyrtec makes her extremely dry and she cannot tolerate this dryness and she's going to try 5 mg to see if this helps with her symptoms. She continues on a nasal steroid consistently.  Her GI issue is going okay although she certainly continues to have intermittent bouts of nasopharyngeal regurgitation. Fortunately, these are relatively infrequent and she does not have any suggestion of recurrent aspiration that's ongoing at this point in time. She's discussed this issue with Dr. Thornell Mule, Dr. Redmond Baseman, and Dr. Carlean Purl who have discussed various etiologic factors giving rise to this issue including possible Zenker's diverticulum, severe esophageal dysmotility such as achalasia or scleroderma, and possible neurological source of inadequate coordination of swallowing maneuver. Given the fact that she has been stable regarding this issue and there is never been an issue tied up with any life-threatening aspiration events it  does not sound as though anyone is extremely interested in having her undergo any further evaluation or therapy for this condition other than to treat her with her usual reflux medications.   She continues to use nystatin swish and swallow to treat what appears to be recurrent thrush.  Her asthma is been doing quite well and she has not required a systemic steroid to treat this condition while consistently using Xolair and a combination ICS/long-acting bronchodilator prescribed by Dr. Milinda Hirschfeld. She still has pretty significant dyspnea on exertion which may also be tied up with her diastolic dysfunction but at this point time she does not want any further evaluation or treatment for this condition. She is somewhat limited in her ability to exercise because of musculoskeletal issues in any regard. Most recently she did have a flareup of what appears to be pseudogout that is required the administration of systemic steroids and colchicine and coverage for possible Lyme disease with doxycycline.    Medication List           albuterol 108 (90 Base) MCG/ACT inhaler  Commonly known as:  PROAIR HFA  Inhale 1-2 puffs into the lungs every 6 (six) hours as needed for wheezing or shortness of breath.     ALIGN 4 MG Caps  Take 1 tablet by mouth daily. Once a day     Alpha-Lipoic Acid 600 MG Caps  Take 600 mg by mouth daily. Once a day     BEANO Tabs  Take 1 tablet by mouth daily.     CITRACAL MAXIMUM 315-250 MG-UNIT Tabs  Generic drug:  Calcium Citrate-Vitamin D  Take 1 tablet by mouth  2 (two) times daily.     colchicine 0.6 MG tablet  Take 1 tablet (0.6 mg total) by mouth as needed.     cyproheptadine 4 MG tablet  Commonly known as:  PERIACTIN  Take two tablets every evening     denosumab 60 MG/ML Soln injection  Commonly known as:  PROLIA  Inject 60 mg into the skin every 6 (six) months. Administer in upper arm, thigh, or abdomen     doxycycline 100 MG tablet  Commonly known as:  VIBRA-TABS   Take 1 tablet (100 mg total) by mouth 2 (two) times daily.     EPINEPHrine 0.3 mg/0.3 mL Soaj injection  Commonly known as:  EPIPEN 2-PAK  Inject 0.3 mLs (0.3 mg total) into the muscle once.     FLUTTER Devi  Use as directed     gabapentin 600 MG tablet  Commonly known as:  NEURONTIN  Take 1 tablet (600 mg total) by mouth daily.     GAS-X EXTRA STRENGTH 125 MG Caps  Generic drug:  Simethicone  Take 2 each by mouth daily as needed. For gas relief     hydrocortisone 2.5 % rectal cream  Commonly known as:  PROCTOZONE-HC  Apply as needed as directed     lactase 3000 units tablet  Commonly known as:  LACTAID  Take 3 tablets by mouth as needed. 3-4 tabs with dairy liquids and solids     lipase/protease/amylase 12000 units Cpep capsule  Commonly known as:  CREON  Take 1 capsule (12,000 Units total) by mouth 3 (three) times daily. CREON 24000-76000-120000 U Takes with meals     mirtazapine 30 MG disintegrating tablet  Commonly known as:  REMERON SOLTAB  Take 1 tablet (30 mg total) by mouth at bedtime.     mometasone 50 MCG/ACT nasal spray  Commonly known as:  NASONEX  Place 2 sprays into the nose 2 (two) times daily.     mometasone-formoterol 200-5 MCG/ACT Aero  Commonly known as:  DULERA  Inhale 2 puffs into the lungs 2 (two) times daily.     montelukast 10 MG tablet  Commonly known as:  SINGULAIR  TAKE 1 TABLET BY MOUTH ONCE DAILY     neomycin-bacitracin-polymyxin ointment  Commonly known as:  NEOSPORIN  Apply 1 application topically every 12 (twelve) hours. Use on/in nose as directed as needed     nystatin 100000 UNIT/ML suspension  Commonly known as:  MYCOSTATIN  Use as directed 5 mLs (500,000 Units total) in the mouth or throat 4 (four) times daily. Reported on 03/08/2015     nystatin-triamcinolone ointment  Commonly known as:  MYCOLOG  as needed. Reported on 03/08/2015     OTOVEL OT  Place in ear(s). As directed by Dr. Thornell Mule     propylthiouracil 50 MG tablet    Commonly known as:  PTU  take 1/2 tablet (25 mg) twice daily     pseudoephedrine-guaifenesin 60-600 MG 12 hr tablet  Commonly known as:  MUCINEX D  Take 1 tablet by mouth every 12 (twelve) hours.     ranitidine 300 MG tablet  Commonly known as:  ZANTAC  Take 1 tablet by mouth daily.     SYSTANE ULTRA 0.4-0.3 % Soln  Generic drug:  Polyethyl Glycol-Propyl Glycol  Apply 1 drop to eye 2 (two) times daily as needed. 1-2 drops each eye every morning and at bedtime     TYLENOL ARTHRITIS PAIN 650 MG CR tablet  Generic drug:  acetaminophen  Take 650  mg by mouth 2 (two) times daily.     VINATE M 27-1 MG Tabs  Take 1 tablet by mouth daily.     Vitamin D 2000 units Caps  1/2 at bedtime     XOLAIR 150 MG injection  Generic drug:  omalizumab  Inject 150 mg into the skin every 28 (twenty-eight) days. 150 SQ every 4 weeks        Past Medical History  Diagnosis Date  . Chronic neck pain   . Allergic rhinitis   . Moderate persistent asthma     -FeV1 72% 2011, -IgE 102 2011, CT sinus Neg 2011  . GERD (gastroesophageal reflux disease)   . Hypothyroidism   . Osteoporosis     on reclast yearly  . Hiatal hernia   . Diverticulosis   . Hemorrhoids   . Asthma   . IBS (irritable bowel syndrome)   . Complication of anesthesia     "had hard time waking up from it several times" (02/20/2012)  . Hypercholesteremia   . Pneumonia 04/2011; ~ 11/2011    "double; single" (02/20/2012)  . Exertional dyspnea   . Graves disease   . Headache(784.0)     "related to allergies; more at different times during the year" (02/20/2012)  . Arthritis     "mostly the hands" (02/20/2012)  . Fibromyalgia 11/2011  . Chronic lower back pain   . Depression     "some; don't take anything for it" (02/20/2012)  . Breast cancer ([place]) 1998    in remission  . Anxiety   . DVT (deep venous thrombosis) ([place])   . Allergy     SEASONAL  . Anemia   . Cataract     REMOVED    Past Surgical History  Procedure Laterality  Date  . Cervical fusion  2003    C3-C4  . Cervical discectomy  10/2001    C5-C6  . Vesicovaginal fistula closure w/ tah  1988  . Nasal septum surgery  1980's  . Anterior and posterior repair  1990's  . Breast lumpectomy  1998    left  . Debridement tennis elbow  ?1970's    right  . Knee arthroplasty  ?1990's    "?right; w/cartilage repair" (02/20/2012)  . Carpometacarpel ([facility]) fusion of thumb with autograft from radius  ~ 2009    "both thumbs" (02/20/2012)  . Cataract extraction w/ intraocular lens  implant, bilateral  2012  . Tonsillectomy  ~ 1953  . Posterior cervical fusion/foraminotomy  2004    "failed initial fusion; rewired  anterior neck" (02/20/2012)  . Colonoscopy    . Appendectomy    . Cholecystectomy    . Hysterectomy    . Esophagogastroduodenoscopy      Allergies  Allergen Reactions  . Dust Mite Extract Shortness Of Breath and Other (See Comments)    "sneezing" (02/20/2012)  . Molds & Smuts Shortness Of Breath  . Morphine Sulfate Itching  . Other Shortness Of Breath and Other (See Comments)    Grass and weeds "sneezing; filled sinuses" (02/20/2012)  . Penicillins Rash and Other (See Comments)    "welts" (02/20/2012)  . Reclast [Zoledronic Acid]     Put in hospital  . Rofecoxib Swelling    Celebrex REACTION: feet swelling  . Shrimp Flavor Anaphylaxis    ALL SHELLFISH  . Tetracycline Hcl Nausea And Vomiting  . Dilaudid [Hydromorphone Hcl] Itching  . Hydrocodone-Acetaminophen Nausea And Vomiting  . Levofloxacin Other (See Comments)    REACTION: GI upset  .  Oxycodone Hcl Nausea And Vomiting  . Paroxetine Nausea And Vomiting    Paxil   . Diltiazem Swelling  . Tree Extract Other (See Comments)    "tested and told I was allergic to it; never experienced a reaction to it" (02/20/2012)    Review of systems negative except as noted in HPI / PMHx or noted below:  Review of Systems  Constitutional: Negative.   HENT: Negative.   Eyes: Negative.   Respiratory:  Negative.   Cardiovascular: Negative.   Gastrointestinal: Negative.   Genitourinary: Negative.   Musculoskeletal: Negative.   Skin: Negative.   Neurological: Negative.   Endo/Heme/Allergies: Negative.   Psychiatric/Behavioral: Negative.      Objective:   Filed Vitals:   06/07/15 1014  BP: 140/70  Pulse: 80  Resp: 18          Physical Exam  Constitutional: She is well-developed, well-nourished, and in no distress.  HENT:  Head: Normocephalic.  Right Ear: Tympanic membrane, external ear and ear canal normal.  Left Ear: Tympanic membrane, external ear and ear canal normal.  Nose: Nose normal. No mucosal edema or rhinorrhea.  Mouth/Throat: Uvula is midline, oropharynx is clear and moist and mucous membranes are normal. No oropharyngeal exudate.  Eyes: Conjunctivae are normal.  Neck: Trachea normal. No tracheal tenderness present. No tracheal deviation present. No thyromegaly present.  Cardiovascular: Normal rate, regular rhythm, S1 normal, S2 normal and normal heart sounds.   No murmur heard. Pulmonary/Chest: Breath sounds normal. No stridor. No respiratory distress. She has no wheezes. She has no rales.  Musculoskeletal: She exhibits no edema.  Lymphadenopathy:       Head (right side): No tonsillar adenopathy present.       Head (left side): No tonsillar adenopathy present.    She has no cervical adenopathy.  Neurological: She is alert. Gait normal.  Skin: No rash noted. She is not diaphoretic. No erythema. Nails show no clubbing.  Psychiatric: Mood and affect normal.    Diagnostics:    Spirometry was performed and demonstrated an FEV1 of 1.66 at 79 % of predicted.  The patient had an Asthma Control Test with the following results:  .    Assessment and Plan:   1. Severe persistent asthma, uncomplicated   2. Facial pain syndrome   3. Other allergic rhinitis   4. LPRD (laryngopharyngeal reflux disease)     1. Treat facial pain and sleep dysfunction:   A.  Continue Periactin 4 mg tablet  2 tablets at bedtime  2. Treat laryngopharyngeal reflux:   A. Continue Dexilant 60 mg in the morning  B. Continue ranitidine 300 mg in the evening  4. Continue Dulera 200 2 inhalations twice a day  5. Continue Nasonex one spray each nostril twice a day  6. Continue montelukast 10 mg daily  7. Continue nystatin swish and swallow twice a day  8. Continue Xolair and EpiPen as prescribed by Dr. Ashok Cordia  9. Use bronchodilator and Astelin and Zyrtec if needed  10. Return to clinic in 12 weeks or earlier if problem  Overall I think that Spikes is doing relatively well and I'm going to continue to have her use Periactin and her other medications as noted above and regroup to see her back in this clinic in approximately 12 weeks or earlier if there is a problem. She appears to have a very good understanding of her disease state and how to properly use her medications. She will contact me during the interval should there  be a problem.  Allena Katz, MD [place]

## 2015-06-07 NOTE — Patient Instructions (Signed)
  1. Treat facial pain and sleep dysfunction:   A. Continue Periactin 4 mg tablet  2 tablets at bedtime  2. Treat laryngopharyngeal reflux:   A. Continue Dexilant 60 mg in the morning  B. Continue ranitidine 300 mg in the evening  4. Continue Dulera 200 2 inhalations twice a day  5. Continue Nasonex one spray each nostril twice a day  6. Continue montelukast 10 mg daily  7. Continue nystatin swish and swallow twice a day  8. Continue Xolair and EpiPen as prescribed by Dr. Ashok Cordia  9. Use bronchodilator and Astelin and Zyrtec if needed  10. Return to clinic in 12 weeks or earlier if problem

## 2015-06-09 ENCOUNTER — Encounter: Payer: Self-pay | Admitting: Family Medicine

## 2015-06-10 LAB — CULTURE, BLOOD (SINGLE): Organism ID, Bacteria: NO GROWTH

## 2015-06-14 ENCOUNTER — Telehealth: Payer: Self-pay | Admitting: Pulmonary Disease

## 2015-06-14 ENCOUNTER — Telehealth: Payer: Self-pay | Admitting: Family Medicine

## 2015-06-14 MED ORDER — NYSTATIN 100000 UNIT/ML MT SUSP: 5.0000 mL | Freq: Four times a day (QID) | OROMUCOSAL | Status: DC

## 2015-06-14 MED ORDER — FLUCONAZOLE 150 MG PO TABS: 150.0000 mg | ORAL_TABLET | Freq: Once | ORAL | Status: DC

## 2015-06-14 NOTE — Telephone Encounter (Signed)
[facility] patient is getting a yeast infection all over including mouth, because of all the meds she has been taking, can something be called in, she is requesting a 3 dose rx if possible, she says one is not enough  [phone] (H) If any questions

## 2015-06-14 NOTE — Telephone Encounter (Signed)
rx 's to pharmacy and pt aware

## 2015-06-14 NOTE — Telephone Encounter (Signed)
Ok with diflucan 150 pox 1, may repeat in 1 week.  I would use nystati swixh and swallow for thrush 1 tsp poq6 hrs for 1 week.

## 2015-06-15 NOTE — Telephone Encounter (Addendum)
#   Vials:1 Arrival Date:06/15/15 Lot [id number] Exp Date:11/20  I didn't realize the xolair order sheet was in here until it was too late to put the order in. Katie aware.

## 2015-06-15 NOTE — Telephone Encounter (Addendum)
#   vials:1 Ordered date:06/14/15 Shipping Date:06/15/15

## 2015-06-27 ENCOUNTER — Ambulatory Visit (INDEPENDENT_AMBULATORY_CARE_PROVIDER_SITE_OTHER): Payer: Medicare Other

## 2015-06-27 DIAGNOSIS — J4540 Moderate persistent asthma, uncomplicated: Secondary | ICD-10-CM | POA: Diagnosis not present

## 2015-06-27 MED ORDER — OMALIZUMAB 150 MG ~~LOC~~ SOLR
150.0000 mg | Freq: Once | SUBCUTANEOUS | Status: AC
  Administered 2015-06-27: 150 mg via SUBCUTANEOUS

## 2015-07-04 ENCOUNTER — Telehealth: Payer: Self-pay | Admitting: Pulmonary Disease

## 2015-07-04 NOTE — Telephone Encounter (Signed)
#   vials:1 Ordered date:07/01/15 Per [name] ok to wait. Shipping Date:07/05/15

## 2015-07-05 NOTE — Telephone Encounter (Signed)
#   Vials:1 Arrival Date:07/05/15 Lot [id number] Exp Date:12//20

## 2015-07-08 ENCOUNTER — Ambulatory Visit (INDEPENDENT_AMBULATORY_CARE_PROVIDER_SITE_OTHER): Payer: Medicare Other | Admitting: Family Medicine

## 2015-07-08 ENCOUNTER — Encounter: Payer: Self-pay | Admitting: Family Medicine

## 2015-07-08 VITALS — BP 136/80 | HR 72 | Temp 98.1°F | Resp 18 | Wt 116.0 lb

## 2015-07-08 DIAGNOSIS — M1120 Other chondrocalcinosis, unspecified site: Secondary | ICD-10-CM

## 2015-07-08 DIAGNOSIS — M1180 Other specified crystal arthropathies, unspecified site: Secondary | ICD-10-CM

## 2015-07-08 MED ORDER — NYSTATIN-TRIAMCINOLONE 100000-0.1 UNIT/GM-% EX OINT: TOPICAL_OINTMENT | Freq: Two times a day (BID) | CUTANEOUS | Status: DC

## 2015-07-08 MED ORDER — COLCHICINE 0.6 MG PO TABS: 0.6000 mg | ORAL_TABLET | Freq: Every day | ORAL | Status: DC

## 2015-07-08 MED ORDER — FLUCONAZOLE 150 MG PO TABS: 150.0000 mg | ORAL_TABLET | Freq: Once | ORAL | Status: DC

## 2015-07-08 NOTE — Progress Notes (Signed)
Subjective:    Patient ID: Ebony Scott, female    DOB: 1950/07/23, 65 y.o.   MRN: [id number]  HPI  05/26/15 She presents with one-week of severe left knee pain. The pain began shortly after she received epidural steroid injections at her pain specialist. She waited a week but the pain has intensified in her left knee. Now the left knee is swollen with an effusion and also very warm to the touch. She can barely walk on it. It hurts to put weight on it. She denies any injury. Patient had similar symptoms in the past. She was hospitalized in 2014 and joint fluid analysis revealed calcium pyrophosphate crystals consistent with pseudogout. The reaction she was experiencing at that time is very much similar to the reaction she is experiencing now. There is no evidence of septic arthritis in that the patient has not had any instrumentation done to the knee. She has no symptoms of a systemic infection. There is no erythema in the knee.  At that time, my plan was: Given the level of the patient's pain and her past medical history, I believe this is acute pseudogout. Using sterile technique, I aspirated the left knee through an 18-gauge needle and was able to aspirate 10 mL of yellow serous fluid. This fluid will be sent for cell count, crystal analysis, and culture. Through the same needle then, I injected 2 mL of 0.1% lidocaine, 2 mL of Marcaine, and 2 mL of 40 mg per mL Kenalog. I explained to the patient that it would be dangerous to go ahead and inject her knee until we have ruled out infection. However the patient is willing to do this because it is hard for her to come back to the clinic as she does not have transportation and I believe this is truly pseudogout. Patient tolerated the procedure well with no complication. Return fasting for a CMP and fasting lipid panel to monitor her cholesterol and her liver function test.  06/03/15 Culture and graim stain were negative for septic arthritis.    Patient states  that her left knee became much better after the cortisone shot.  however 2 days ago, the patient developed diffuse pain all over her body particularly in her neck and shoulders and lower back and tailbone. She is also now hurting in her knees and her ankles. She has a low-grade fever to 100.1. She denies any recent tick bites. She denies any rashes. She does have normal range of motion in her neck but it is sore and painful. She has had a similar systemic reaction to pseudogout in the past. Please see the hospitalization from 2014 area and however I am also concerned about systemic illness such as possible tickborne illness as well as complications from epidural steroid injections. Clinically she does not appear to have meningitis and she does not appear toxic. I believe the likelihood of meningitis secondary to an epidural steroid injection is highly unlikely given her presentation. Suspect pseudogout particularly given the reaction the patient had to the shot in her knee. Polymyalgia rheumatica is also a possibility.  At that time, my plan was: Ultimately we are going to have to make a decision based on incomplete information. The question is whether this is pseudogout similar to what she experienced in 2014 which would benefit from systemic steroids, polymyalgia rheumatica which would benefit from systemic steroids, or some type of systemic infection such as a tickborne illness versus bacteremia. I will check blood cultures today. I will  check Lyme titers along with [facility] spotted fever titers. I asked pointed the patient that this will not be back in any time frame to help [place] make a decision. I will obtain a stat CBC. If her white blood cell count is elevated, I would recommend ER evaluation and imaging of the involved joints to evaluate for possible possible signs of osteomyelitis and bacteremia. If her white blood cell count is normal, I would suggest trying systemic steroids over the weekend with a  low threshold to go to the hospital should her symptoms worsen for presumptive treatment of pseudogout  White blood cell count is elevated at 16. We discussed the differential diagnosis including bacteremia with developing osteomyelitis versus tickborne illness versus pseudogout. Clinically the patient does not appear toxic or sick. I truly believe them a hard this is pseudogout. Therefore I started the patient on a prednisone taper pack in addition to doxycycline 100 mg by mouth twice a day until we receive the tickborne titers back. Patient is aware of the risk of doing this including worsening of a systemic infection. She will go the hospital immediately over the weekend if she worsens. However she is in agreement to try prednisone and she also agrees this feels like her previous systemic pseudogout.  06/06/15 Thankfully, the patient is almost 100% better. Blood cultures are negative. Tickborne titers are still pending.  At that time, my plan was: Patient appears to be having an acute flare of pseudogout which is now essentially resolved. Complete prednisone. Begin colchicine 0.6 mg by mouth daily for the next 2 weeks and then discontinue colchicine if pain does not return. Continue doxycycline until tick borne titers have returned however I do not believe this is likely the cause  07/08/15 Symptoms completely went away after finishing prednisone. However recently she started having pain again in both feet and in both knees. She started taking colchicine immediately 1 pill a day and the pain now is completely better. This occurred 2 days ago. But she believes she was having another episode of pseudogout beginning to start. Past Medical History  Diagnosis Date  . Chronic neck pain   . Allergic rhinitis   . Moderate persistent asthma     -FeV1 72% 2011, -IgE 102 2011, CT sinus Neg 2011  . GERD (gastroesophageal reflux disease)   . Hypothyroidism   . Osteoporosis     on reclast yearly  . Hiatal  hernia   . Diverticulosis   . Hemorrhoids   . Asthma   . IBS (irritable bowel syndrome)   . Complication of anesthesia     "had hard time waking up from it several times" (02/20/2012)  . Hypercholesteremia   . Pneumonia 04/2011; ~ 11/2011    "double; single" (02/20/2012)  . Exertional dyspnea   . Graves disease   . Headache(784.0)     "related to allergies; more at different times during the year" (02/20/2012)  . Arthritis     "mostly the hands" (02/20/2012)  . Fibromyalgia 11/2011  . Chronic lower back pain   . Depression     "some; don't take anything for it" (02/20/2012)  . Breast cancer ([place]) 1998    in remission  . Anxiety   . DVT (deep venous thrombosis) ([place])   . Allergy     SEASONAL  . Anemia   . Cataract     REMOVED   Past Surgical History  Procedure Laterality Date  . Cervical fusion  2003    C3-C4  .  Cervical discectomy  10/2001    C5-C6  . Vesicovaginal fistula closure w/ tah  1988  . Nasal septum surgery  1980's  . Anterior and posterior repair  1990's  . Breast lumpectomy  1998    left  . Debridement tennis elbow  ?1970's    right  . Knee arthroplasty  ?1990's    "?right; w/cartilage repair" (02/20/2012)  . Carpometacarpel ([facility]) fusion of thumb with autograft from radius  ~ 2009    "both thumbs" (02/20/2012)  . Cataract extraction w/ intraocular lens  implant, bilateral  2012  . Tonsillectomy  ~ 1953  . Posterior cervical fusion/foraminotomy  2004    "failed initial fusion; rewired  anterior neck" (02/20/2012)  . Colonoscopy    . Appendectomy    . Cholecystectomy    . Hysterectomy    . Esophagogastroduodenoscopy     Current Outpatient Prescriptions on File Prior to Visit  Medication Sig Dispense Refill  . acetaminophen (TYLENOL ARTHRITIS PAIN) 650 MG CR tablet Take 650 mg by mouth 2 (two) times daily.     Marland Kitchen albuterol (PROAIR HFA) 108 (90 BASE) MCG/ACT inhaler Inhale 1-2 puffs into the lungs every 6 (six) hours as needed for wheezing or shortness of breath. 1  Inhaler 3  . Alpha-D-Galactosidase (BEANO) TABS Take 1 tablet by mouth daily.     . Alpha-Lipoic Acid 600 MG CAPS Take 600 mg by mouth daily. Once a day    . Calcium Citrate-Vitamin D (CITRACAL MAXIMUM) 315-250 MG-UNIT TABS Take 1 tablet by mouth 2 (two) times daily.     . Cholecalciferol (VITAMIN D) 2000 UNITS CAPS 1/2 at bedtime    . Ciprofloxacin-Fluocinolone (OTOVEL OT) Place in ear(s). As directed by Dr. Thornell Mule    . colchicine 0.6 MG tablet Take 1 tablet (0.6 mg total) by mouth as needed. 30 tablet 2  . cyproheptadine (PERIACTIN) 4 MG tablet Take two tablets every evening 60 tablet 3  . denosumab (PROLIA) 60 MG/ML SOLN injection Inject 60 mg into the skin every 6 (six) months. Administer in upper arm, thigh, or abdomen    . doxycycline (VIBRA-TABS) 100 MG tablet Take 1 tablet (100 mg total) by mouth 2 (two) times daily. 20 tablet 0  . EPINEPHrine (EPIPEN 2-PAK) 0.3 mg/0.3 mL IJ SOAJ injection Inject 0.3 mLs (0.3 mg total) into the muscle once. 1 Device 4  . fluconazole (DIFLUCAN) 150 MG tablet Take 1 tablet (150 mg total) by mouth once. And repeat in 1 week 2 tablet 0  . gabapentin (NEURONTIN) 600 MG tablet Take 1 tablet (600 mg total) by mouth daily. 90 tablet 3  . hydrocortisone (PROCTOZONE-HC) 2.5 % rectal cream Apply as needed as directed 30 g 11  . lactase (LACTAID) 3000 UNITS tablet Take 3 tablets by mouth as needed. 3-4 tabs with dairy liquids and solids    . lipase/protease/amylase (CREON) 12000 UNITS CPEP capsule Take 1 capsule (12,000 Units total) by mouth 3 (three) times daily. CREON 24000-76000-120000 U Takes with meals 270 capsule 5  . mirtazapine (REMERON SOLTAB) 30 MG disintegrating tablet Take 1 tablet (30 mg total) by mouth at bedtime. 30 tablet 11  . mometasone (NASONEX) 50 MCG/ACT nasal spray Place 2 sprays into the nose 2 (two) times daily. 102 g 3  . mometasone-formoterol (DULERA) 200-5 MCG/ACT AERO Inhale 2 puffs into the lungs 2 (two) times daily. 1 Inhaler 5  .  montelukast (SINGULAIR) 10 MG tablet TAKE 1 TABLET BY MOUTH ONCE DAILY 30 tablet 11  . neomycin-bacitracin-polymyxin (NEOSPORIN)  ointment Apply 1 application topically every 12 (twelve) hours. Use on/in nose as directed as needed    . nystatin (MYCOSTATIN) 100000 UNIT/ML suspension Use as directed 5 mLs (500,000 Units total) in the mouth or throat 4 (four) times daily. Reported on 03/08/2015 473 mL 3  . nystatin-triamcinolone ointment (MYCOLOG) as needed. Reported on 03/08/2015    . omalizumab (XOLAIR) 150 MG injection Inject 150 mg into the skin every 28 (twenty-eight) days. 150 SQ every 4 weeks    . Polyethyl Glycol-Propyl Glycol (SYSTANE ULTRA) 0.4-0.3 % SOLN Apply 1 drop to eye 2 (two) times daily as needed. 1-2 drops each eye every morning and at bedtime    . Prenatal Vit-Sel-Fe Fum-FA (VINATE M) 27-1 MG TABS Take 1 tablet by mouth daily. 30 each 11  . Probiotic Product (ALIGN) 4 MG CAPS Take 1 tablet by mouth daily. Once a day    . propylthiouracil (PTU) 50 MG tablet take 1/2 tablet (25 mg) twice daily 30 tablet 5  . pseudoephedrine-guaifenesin (MUCINEX D) 60-600 MG 12 hr tablet Take 1 tablet by mouth every 12 (twelve) hours. 60 tablet 5  . ranitidine (ZANTAC) 300 MG tablet Take 1 tablet by mouth daily.    Marland Kitchen Respiratory Therapy Supplies (FLUTTER) DEVI Use as directed 1 each 0  . Simethicone (GAS-X EXTRA STRENGTH) 125 MG CAPS Take 2 each by mouth daily as needed. For gas relief    . [DISCONTINUED] methimazole (TAPAZOLE) 5 MG tablet Take 5 mg by mouth 3 (three) times daily.       No current facility-administered medications on file prior to visit.   Allergies  Allergen Reactions  . Dust Mite Extract Shortness Of Breath and Other (See Comments)    "sneezing" (02/20/2012)  . Molds & Smuts Shortness Of Breath  . Morphine Sulfate Itching  . Other Shortness Of Breath and Other (See Comments)    Grass and weeds "sneezing; filled sinuses" (02/20/2012)  . Penicillins Rash and Other (See Comments)     "welts" (02/20/2012)  . Reclast [Zoledronic Acid]     Put in hospital  . Rofecoxib Swelling    Celebrex REACTION: feet swelling  . Shrimp Flavor Anaphylaxis    ALL SHELLFISH  . Tetracycline Hcl Nausea And Vomiting  . Dilaudid [Hydromorphone Hcl] Itching  . Hydrocodone-Acetaminophen Nausea And Vomiting  . Levofloxacin Other (See Comments)    REACTION: GI upset  . Oxycodone Hcl Nausea And Vomiting  . Paroxetine Nausea And Vomiting    Paxil   . Diltiazem Swelling  . Tree Extract Other (See Comments)    "tested and told I was allergic to it; never experienced a reaction to it" (02/20/2012)   Social History   Social History  . Marital Status: Divorced    Spouse Name: N/A  . Number of Children: 2  . Years of Education: college   Occupational History  . Disabled     Retired Print production planner  .     Social History Main Topics  . Smoking status: Passive Smoke Exposure - Never Smoker  . Smokeless tobacco: Never Used     Comment: Parents  . Alcohol Use: 0.0 oz/week    0 Standard drinks or equivalent per week     Comment: 02/20/2012 "couple glasses of wine/6 months"  . Drug Use: No  . Sexual Activity: No   Other Topics Concern  . Not on file   Social History Narrative   Patient lives at home alone. Patient  divorced.  Patient has her BS degree.   Right handed.   Caffeine- sometimes coffee.      [facility]:   Born in [place], [place]. She worked as a Copywriter, advertising. She has no pets currently. She does have indoor plants. Previously had mold in her home that was remediated. Carpet was removed.               Review of Systems  All other systems reviewed and are negative.      Objective:   Physical Exam  Cardiovascular: Normal rate and regular rhythm.   Pulmonary/Chest: Effort normal and breath sounds normal.  Musculoskeletal:       Right shoulder: She exhibits normal range of motion.       Left shoulder: She exhibits normal range of motion.        Right knee: She exhibits no erythema. No tenderness found. No medial joint line and no lateral joint line tenderness noted.       Left knee: She exhibits normal range of motion, no swelling, no effusion and no erythema. No tenderness found. No medial joint line and no lateral joint line tenderness noted.       Right ankle: She exhibits normal range of motion.       Left ankle: She exhibits normal range of motion.  Vitals reviewed.         Assessment & Plan:  Pseudogout I recommended colchicine 0.6 mg by mouth daily as a prophylaxis. During outbreaks she is to take 1.2 mg immediately and then another 0.6 mg tablet within 2 hours if the pain persists. She is then to take 0.6 mg by mouth twice a day every day until the pain completely resolves and then resume 0.6 mg by mouth daily as a prophylaxis. She continues to have outbreaks even on this strategy, I recommend a rheumatology consultation. She also has a yeast infection for which she is requesting Diflucan and Lotrisone cream.

## 2015-07-13 IMAGING — CR DG CHEST 2V
2 series · 2 of 2 positions shown · non-contrast
Comparison: CT chest 06/05/2012.

CLINICAL DATA: Left-sided chest pain.

EXAM:
CHEST  2 VIEW

[view not recorded (1 of 2)]
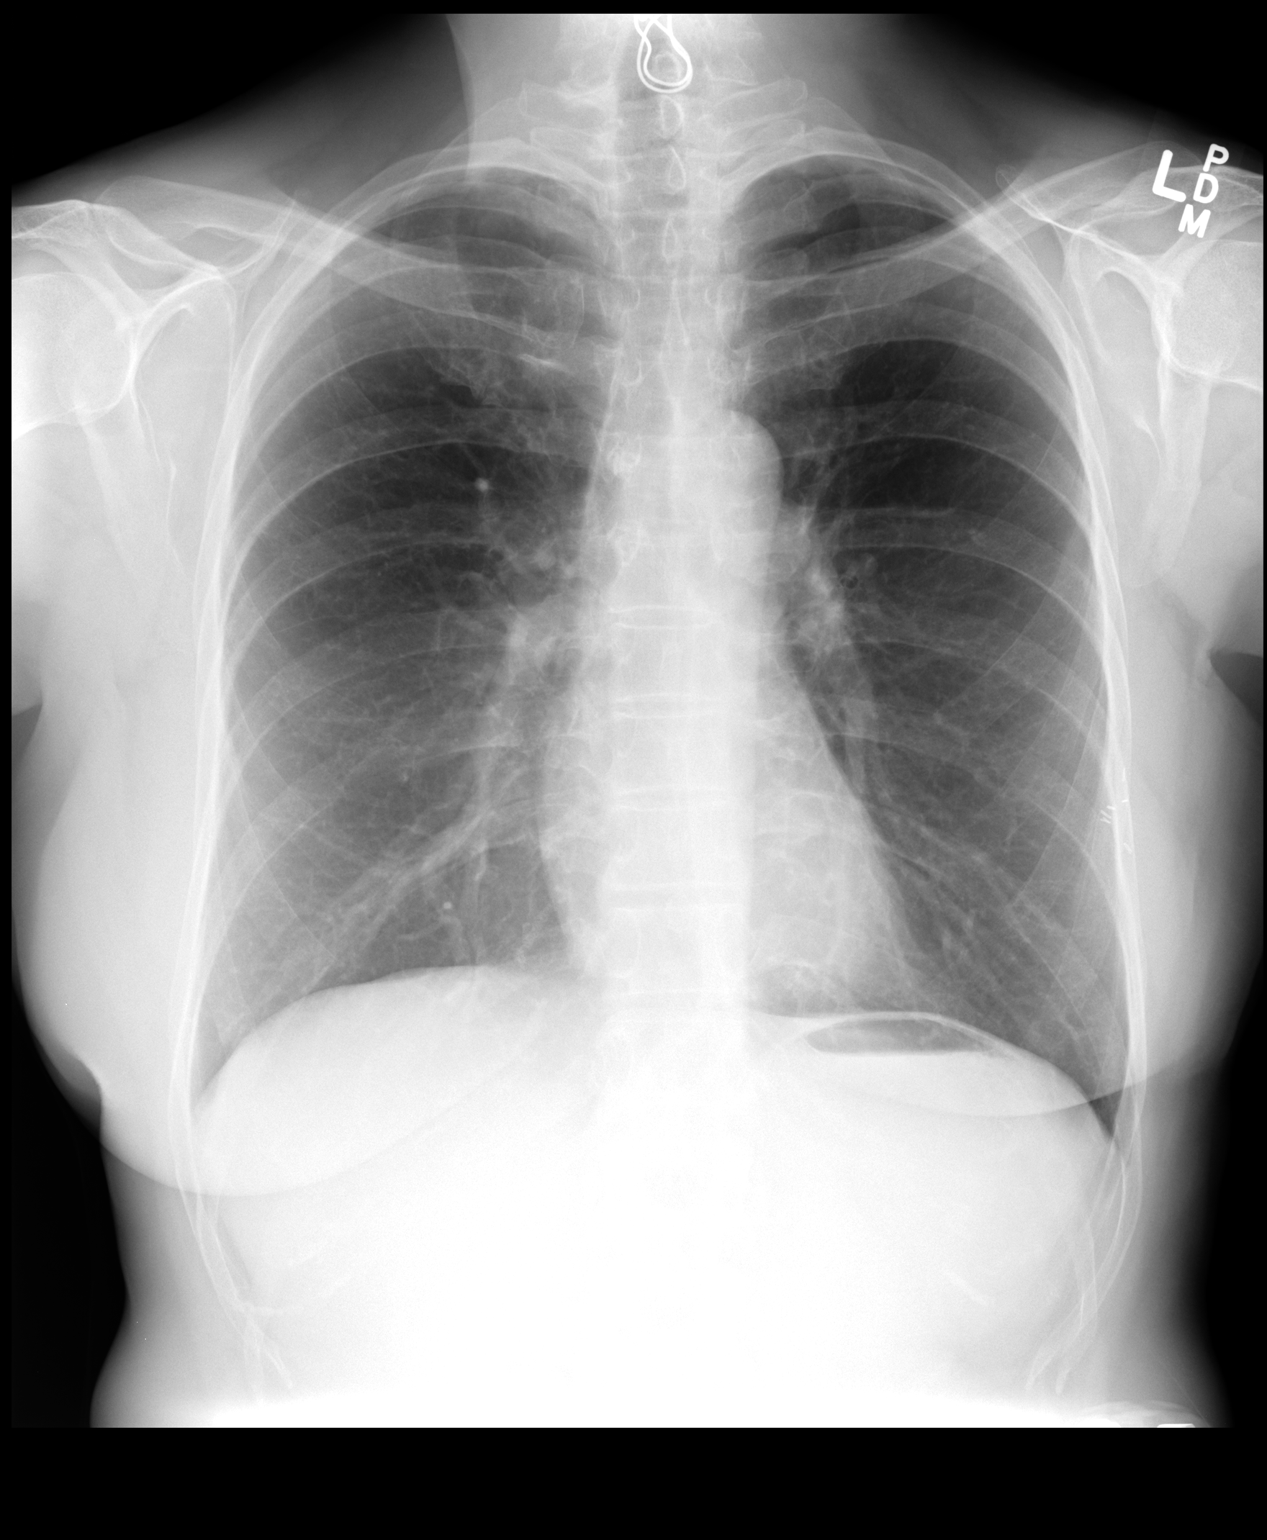

[view not recorded (2 of 2)]
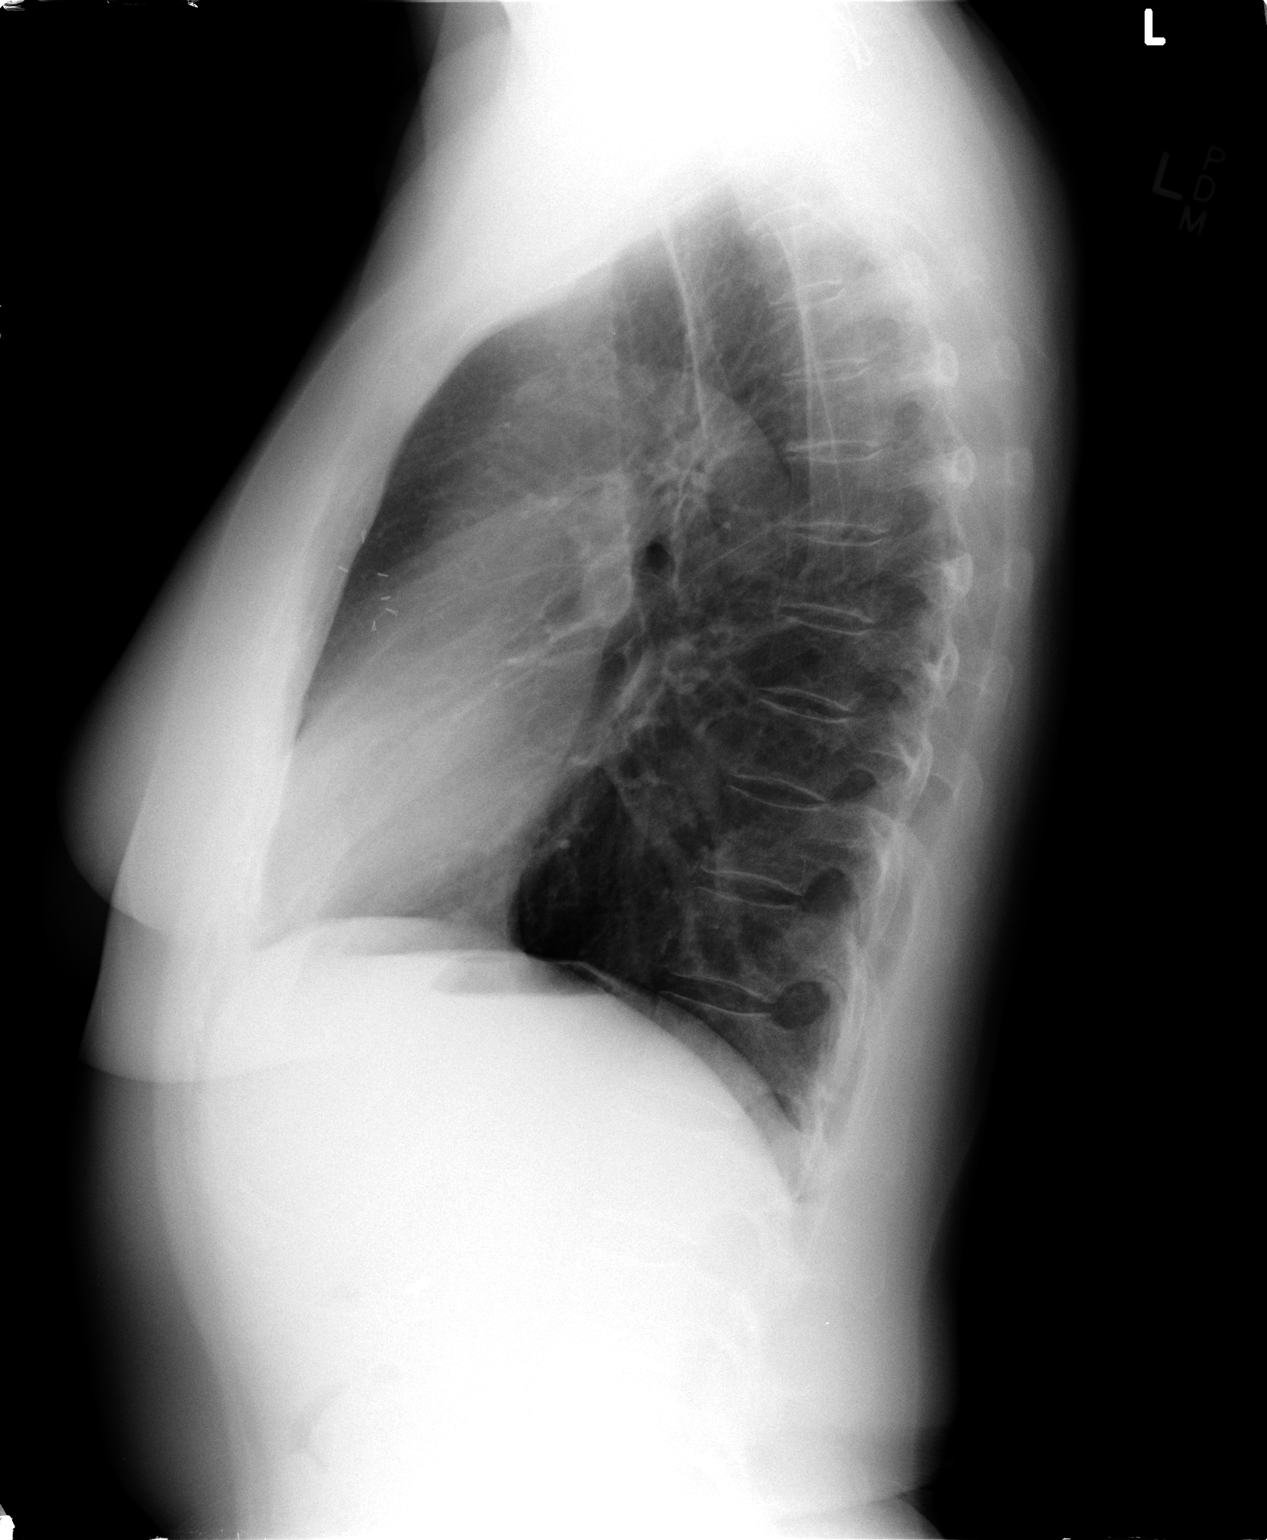

[2 of 2 positions shown; findings below may reference images not displayed]

FINDINGS: The heart size and mediastinal contours are within normal limits.
Both lungs are clear. The visualized skeletal structures are
unremarkable.
IMPRESSION: No active cardiopulmonary disease.

## 2015-07-25 ENCOUNTER — Telehealth: Payer: Self-pay | Admitting: Family Medicine

## 2015-07-25 ENCOUNTER — Ambulatory Visit (INDEPENDENT_AMBULATORY_CARE_PROVIDER_SITE_OTHER): Payer: Medicare Other

## 2015-07-25 DIAGNOSIS — J4540 Moderate persistent asthma, uncomplicated: Secondary | ICD-10-CM | POA: Diagnosis not present

## 2015-07-25 MED ORDER — OMALIZUMAB 150 MG ~~LOC~~ SOLR
150.0000 mg | Freq: Once | SUBCUTANEOUS | Status: AC
  Administered 2015-07-25: 150 mg via SUBCUTANEOUS

## 2015-07-25 NOTE — Telephone Encounter (Signed)
Patient calling to see if a specific day would be ok to get her prolia shot  Please call her at [phone]

## 2015-07-25 NOTE — Telephone Encounter (Signed)
Any day is fine.   Please call patient.

## 2015-07-28 ENCOUNTER — Telehealth: Payer: Self-pay | Admitting: Family Medicine

## 2015-07-28 NOTE — Telephone Encounter (Signed)
Pt would like to have her Prolia shot ordered so that she can have the injection when she comes in for her CPE on 6/23.

## 2015-07-29 NOTE — Telephone Encounter (Signed)
Prolia is available at this time.

## 2015-08-01 ENCOUNTER — Telehealth: Payer: Self-pay | Admitting: Pulmonary Disease

## 2015-08-01 ENCOUNTER — Encounter: Payer: Self-pay | Admitting: Family Medicine

## 2015-08-01 NOTE — Telephone Encounter (Signed)
#   vials:1 Ordered date:08/01/15 Shipping Date:08/02/15

## 2015-08-03 NOTE — Telephone Encounter (Signed)
#   Vials:1 Arrival Date:08/03/15 Lot [id number] Exp Date:12/20

## 2015-08-04 ENCOUNTER — Other Ambulatory Visit: Payer: Self-pay | Admitting: *Deleted

## 2015-08-04 MED ORDER — RANITIDINE HCL 300 MG PO TABS: 300.0000 mg | ORAL_TABLET | Freq: Every day | ORAL | Status: DC

## 2015-08-08 ENCOUNTER — Ambulatory Visit (INDEPENDENT_AMBULATORY_CARE_PROVIDER_SITE_OTHER): Payer: Medicare Other | Admitting: Pulmonary Disease

## 2015-08-08 ENCOUNTER — Encounter: Payer: Self-pay | Admitting: Family Medicine

## 2015-08-08 VITALS — BP 132/76 | HR 80 | Temp 97.3°F | Ht 61.25 in | Wt 117.0 lb

## 2015-08-08 DIAGNOSIS — K219 Gastro-esophageal reflux disease without esophagitis: Secondary | ICD-10-CM

## 2015-08-08 DIAGNOSIS — J309 Allergic rhinitis, unspecified: Secondary | ICD-10-CM

## 2015-08-08 DIAGNOSIS — J4550 Severe persistent asthma, uncomplicated: Secondary | ICD-10-CM

## 2015-08-08 LAB — PULMONARY FUNCTION TEST
DL/VA % pred: 94 %
DL/VA: 4.19 ml/min/mmHg/L
DLCO cor % pred: 66 %
DLCO cor: 13.76 ml/min/mmHg
DLCO unc % pred: 60 %
DLCO unc: 12.36 ml/min/mmHg
FEF 25-75 Post: 2.74 L/sec
FEF 25-75 Pre: 2.41 L/sec
FEF2575-%Change-Post: 13 %
FEF2575-%Pred-Post: 147 %
FEF2575-%Pred-Pre: 129 %
FEV1-%Change-Post: 8 %
FEV1-%Pred-Post: 90 %
FEV1-%Pred-Pre: 82 %
FEV1-Post: 1.89 L
FEV1-Pre: 1.73 L
FEV1FVC-%Change-Post: 4 %
FEV1FVC-%Pred-Pre: 109 %
FEV6-%Change-Post: 4 %
FEV6-%Pred-Post: 82 %
FEV6-%Pred-Pre: 79 %
FEV6-Post: 2.17 L
FEV6-Pre: 2.08 L
FEV6FVC-%Pred-Post: 104 %
FEV6FVC-%Pred-Pre: 104 %
FVC-%Change-Post: 4 %
FVC-%Pred-Post: 78 %
FVC-%Pred-Pre: 75 %
FVC-Post: 2.17 L
FVC-Pre: 2.08 L
Post FEV1/FVC ratio: 87 %
Post FEV6/FVC ratio: 100 %
Pre FEV1/FVC ratio: 83 %
Pre FEV6/FVC Ratio: 100 %

## 2015-08-08 NOTE — Progress Notes (Signed)
PFT done today. 

## 2015-08-08 NOTE — Progress Notes (Signed)
Test reviewed.  

## 2015-08-08 NOTE — Patient Instructions (Addendum)
   Continue taking your inhalers and medications as prescribed.  I'm starting you on Spiriva. Inhale 2 puffs once daily. Call me if you have a problem on this medication. Call me for prescription if this seems to significantly help your cough.  Make sure your primary care physician down a blood count to test your hemoglobin (CBC) at your appointment this week.  Try using Guaifenesin 600mg  instead of Mucinex D twice daily to help with your mucus and secretions.  I will see you back in 3 months or sooner if needed.  Call me if you have any new questions or concerns.   TESTS ORDERED: 1. Spirometry with bronchodilator response at next appointment 2. Maxillofacial CT Scan w/o
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
## 2015-08-12 ENCOUNTER — Encounter: Payer: Self-pay | Admitting: Family Medicine

## 2015-08-12 ENCOUNTER — Encounter: Payer: Medicare Other | Admitting: Family Medicine

## 2015-08-12 ENCOUNTER — Ambulatory Visit (INDEPENDENT_AMBULATORY_CARE_PROVIDER_SITE_OTHER): Payer: Medicare Other | Admitting: Family Medicine

## 2015-08-12 ENCOUNTER — Ambulatory Visit (INDEPENDENT_AMBULATORY_CARE_PROVIDER_SITE_OTHER)
Admission: RE | Admit: 2015-08-12 | Discharge: 2015-08-12 | Disposition: A | Discharge status: Home / Self Care | Payer: Medicare Other | Source: Ambulatory Visit | Attending: Pulmonary Disease | Admitting: Pulmonary Disease

## 2015-08-12 VITALS — BP 136/80 | HR 76 | Temp 98.7°F | Resp 16 | Ht 62.0 in | Wt 116.0 lb

## 2015-08-12 DIAGNOSIS — J309 Allergic rhinitis, unspecified: Secondary | ICD-10-CM

## 2015-08-12 DIAGNOSIS — Z0000 Encounter for general adult medical examination without abnormal findings: Secondary | ICD-10-CM | POA: Diagnosis not present

## 2015-08-12 DIAGNOSIS — E0590 Thyrotoxicosis, unspecified without thyrotoxic crisis or storm: Secondary | ICD-10-CM

## 2015-08-12 DIAGNOSIS — M810 Age-related osteoporosis without current pathological fracture: Secondary | ICD-10-CM | POA: Diagnosis not present

## 2015-08-12 DIAGNOSIS — E785 Hyperlipidemia, unspecified: Secondary | ICD-10-CM

## 2015-08-12 LAB — LIPID PANEL
Cholesterol: 200 mg/dL (ref 125–200)
HDL: 79 mg/dL (ref >=46)
LDL Cholesterol: 97 mg/dL (ref <130)
Total CHOL/HDL Ratio: 2.5 Ratio (ref <=5.0)
Triglycerides: 120 mg/dL (ref <150)
VLDL: 24 mg/dL (ref <30)

## 2015-08-12 LAB — CBC WITH DIFFERENTIAL/PLATELET
Basophils Absolute: 0 cells/uL (ref 0–200)
Basophils Relative: 0 %
Eosinophils Absolute: 608 cells/uL — ABNORMAL HIGH (ref 15–500)
Eosinophils Relative: 8 %
HCT: 38.9 % (ref 35.0–45.0)
Hemoglobin: 12.5 g/dL (ref 12.0–15.0)
Lymphocytes Relative: 26 %
Lymphs Abs: 1976 cells/uL (ref 850–3900)
MCH: 32.4 pg (ref 27.0–33.0)
MCHC: 32.1 g/dL (ref 32.0–36.0)
MCV: 100.8 fL — ABNORMAL HIGH (ref 80.0–100.0)
MPV: 10.5 fL (ref 7.5–12.5)
Monocytes Absolute: 988 cells/uL — ABNORMAL HIGH (ref 200–950)
Monocytes Relative: 13 %
Neutro Abs: 4028 cells/uL (ref 1500–7800)
Neutrophils Relative %: 53 %
Platelets: 250 10*3/uL (ref 140–400)
RBC: 3.86 MIL/uL (ref 3.80–5.10)
RDW: 14.2 % (ref 11.0–15.0)
WBC: 7.6 10*3/uL (ref 3.8–10.8)

## 2015-08-12 LAB — COMPLETE METABOLIC PANEL WITH GFR
ALT: 34 U/L — ABNORMAL HIGH (ref 6–29)
AST: 31 U/L (ref 10–35)
Albumin: 4.1 g/dL (ref 3.6–5.1)
Alkaline Phosphatase: 36 U/L (ref 33–130)
BUN: 22 mg/dL (ref 7–25)
CO2: 25 mmol/L (ref 20–31)
Calcium: 9 mg/dL (ref 8.6–10.4)
Chloride: 103 mmol/L (ref 98–110)
Creat: 1.04 mg/dL — ABNORMAL HIGH (ref 0.50–0.99)
GFR, Est African American: 64 mL/min (ref >=60)
GFR, Est Non African American: 56 mL/min — ABNORMAL LOW (ref >=60)
Glucose, Bld: 87 mg/dL (ref 70–99)
Potassium: 4.5 mmol/L (ref 3.5–5.3)
Sodium: 141 mmol/L (ref 135–146)
Total Bilirubin: 0.4 mg/dL (ref 0.2–1.2)
Total Protein: 6.5 g/dL (ref 6.1–8.1)

## 2015-08-12 LAB — TSH: TSH: 2.76 mIU/L

## 2015-08-12 MED ORDER — DENOSUMAB 60 MG/ML ~~LOC~~ SOLN
60.0000 mg | Freq: Once | SUBCUTANEOUS | Status: AC
  Administered 2015-08-12: 60 mg via SUBCUTANEOUS

## 2015-08-12 MED ORDER — HYDROCORTISONE 2.5 % RE CREA: TOPICAL_CREAM | RECTAL | Status: DC

## 2015-08-12 MED ORDER — PREDNISONE 20 MG PO TABS: ORAL_TABLET | ORAL | Status: DC

## 2015-08-12 MED ORDER — NYSTATIN-TRIAMCINOLONE 100000-0.1 UNIT/GM-% EX OINT: TOPICAL_OINTMENT | Freq: Two times a day (BID) | CUTANEOUS | Status: DC

## 2015-08-12 NOTE — Addendum Note (Signed)
Addended by: Shary Decamp B on: 08/12/2015 09:46 AM   Modules accepted: Orders

## 2015-08-12 NOTE — Progress Notes (Signed)
Subjective:    Patient ID: Ebony Scott, female    DOB: 08-22-1950, 65 y.o.   MRN: [id number]  HPI Patient is here today for complete physical exam. She declines hepatitis C screening. She also declines a colonoscopy due to cost. Her mammogram is scheduled for August with her GYN. Past medical history significant for hysterectomy and therefore she does not require a Pap smear. Bone density test is up-to-date and she is due for her poorly injection today. Immunizations are up-to-date.: Immunization History  Administered Date(s) Administered  . DTaP 08/18/2013  . Hepatitis A 09/04/2007, 03/02/2008  . Hepatitis B 01/07/1985, 02/06/1985, 08/17/1985  . Influenza Split 11/13/2010, 11/22/2011, 10/20/2012  . Influenza Whole 11/14/2009, 11/21/2011  . Influenza,inj,Quad PF,36+ Mos 11/06/2013, 11/09/2014  . Meningococcal Conjugate 09/04/2007  . Pneumococcal Conjugate-13 05/18/2013  . Pneumococcal Polysaccharide-23 01/05/1994, 11/28/2011  . Td 07/24/1995, 03/16/2005  . Tdap 08/18/2013  . Zoster 03/02/2008   Past Medical History  Diagnosis Date  . Chronic neck pain   . Allergic rhinitis   . Moderate persistent asthma     -FeV1 72% 2011, -IgE 102 2011, CT sinus Neg 2011  . GERD (gastroesophageal reflux disease)   . Hypothyroidism   . Osteoporosis     on reclast yearly  . Hiatal hernia   . Diverticulosis   . Hemorrhoids   . Asthma   . IBS (irritable bowel syndrome)   . Complication of anesthesia     "had hard time waking up from it several times" (02/20/2012)  . Hypercholesteremia   . Pneumonia 04/2011; ~ 11/2011    "double; single" (02/20/2012)  . Exertional dyspnea   . Graves disease   . Headache(784.0)     "related to allergies; more at different times during the year" (02/20/2012)  . Arthritis     "mostly the hands" (02/20/2012)  . Fibromyalgia 11/2011  . Chronic lower back pain   . Depression     "some; don't take anything for it" (02/20/2012)  . Breast cancer ([place]) 1998    in  remission  . Anxiety   . DVT (deep venous thrombosis) ([place])   . Allergy     SEASONAL  . Anemia   . Cataract     REMOVED   Past Surgical History  Procedure Laterality Date  . Cervical fusion  2003    C3-C4  . Cervical discectomy  10/2001    C5-C6  . Vesicovaginal fistula closure w/ tah  1988  . Nasal septum surgery  1980's  . Anterior and posterior repair  1990's  . Breast lumpectomy  1998    left  . Debridement tennis elbow  ?1970's    right  . Knee arthroplasty  ?1990's    "?right; w/cartilage repair" (02/20/2012)  . Carpometacarpel ([facility]) fusion of thumb with autograft from radius  ~ 2009    "both thumbs" (02/20/2012)  . Cataract extraction w/ intraocular lens  implant, bilateral  2012  . Tonsillectomy  ~ 1953  . Posterior cervical fusion/foraminotomy  2004    "failed initial fusion; rewired  anterior neck" (02/20/2012)  . Colonoscopy    . Appendectomy    . Cholecystectomy    . Hysterectomy    . Esophagogastroduodenoscopy     Current Outpatient Prescriptions on File Prior to Visit  Medication Sig Dispense Refill  . acetaminophen (TYLENOL ARTHRITIS PAIN) 650 MG CR tablet Take 650 mg by mouth 2 (two) times daily.     Marland Kitchen albuterol (PROAIR HFA) 108 (90 BASE) MCG/ACT inhaler Inhale  1-2 puffs into the lungs every 6 (six) hours as needed for wheezing or shortness of breath. 1 Inhaler 3  . Alpha-D-Galactosidase (BEANO) TABS Take 1 tablet by mouth daily.     . Alpha-Lipoic Acid 600 MG CAPS Take 600 mg by mouth daily. Once a day    . Calcium Citrate-Vitamin D (CITRACAL MAXIMUM) 315-250 MG-UNIT TABS Take 1 tablet by mouth 2 (two) times daily.     . cetirizine (ZYRTEC) 10 MG tablet Take 1/2 tablet daily    . Cholecalciferol (VITAMIN D) 2000 UNITS CAPS 1/2 at bedtime    . Ciprofloxacin-Fluocinolone (OTOVEL OT) Place in ear(s). As directed by Dr. Thornell Mule    . colchicine 0.6 MG tablet Take 1 tablet (0.6 mg total) by mouth daily. 90 tablet 3  . cyproheptadine (PERIACTIN) 4 MG tablet Take two  tablets every evening 60 tablet 3  . denosumab (PROLIA) 60 MG/ML SOLN injection Inject 60 mg into the skin every 6 (six) months. Administer in upper arm, thigh, or abdomen    . dexlansoprazole (DEXILANT) 60 MG capsule Take 60 mg by mouth daily.    Marland Kitchen EPINEPHrine (EPIPEN 2-PAK) 0.3 mg/0.3 mL IJ SOAJ injection Inject 0.3 mLs (0.3 mg total) into the muscle once. 1 Device 4  . gabapentin (NEURONTIN) 600 MG tablet Take 1 tablet (600 mg total) by mouth daily. 90 tablet 3  . lactase (LACTAID) 3000 UNITS tablet Take 3 tablets by mouth as needed. 3-4 tabs with dairy liquids and solids    . lipase/protease/amylase (CREON) 12000 UNITS CPEP capsule Take 1 capsule (12,000 Units total) by mouth 3 (three) times daily. CREON 24000-76000-120000 U Takes with meals 270 capsule 5  . mirtazapine (REMERON SOLTAB) 30 MG disintegrating tablet Take 1 tablet (30 mg total) by mouth at bedtime. 30 tablet 11  . mometasone (NASONEX) 50 MCG/ACT nasal spray Place 2 sprays into the nose 2 (two) times daily. 102 g 3  . mometasone-formoterol (DULERA) 200-5 MCG/ACT AERO Inhale 2 puffs into the lungs 2 (two) times daily. 1 Inhaler 5  . montelukast (SINGULAIR) 10 MG tablet TAKE 1 TABLET BY MOUTH ONCE DAILY 30 tablet 11  . neomycin-bacitracin-polymyxin (NEOSPORIN) ointment Apply 1 application topically every 12 (twelve) hours. Use on/in nose as directed as needed    . nystatin (MYCOSTATIN) 100000 UNIT/ML suspension Use as directed 5 mLs (500,000 Units total) in the mouth or throat 4 (four) times daily. Reported on 03/08/2015 473 mL 3  . omalizumab (XOLAIR) 150 MG injection Inject 150 mg into the skin every 28 (twenty-eight) days. 150 SQ every 4 weeks    . Polyethyl Glycol-Propyl Glycol (SYSTANE ULTRA) 0.4-0.3 % SOLN Apply 1 drop to eye 2 (two) times daily as needed. 1-2 drops each eye every morning and at bedtime    . pravastatin (PRAVACHOL) 20 MG tablet Take 20 mg by mouth daily.    . Prenatal Vit-Sel-Fe Fum-FA (VINATE M) 27-1 MG TABS Take  1 tablet by mouth daily. 30 each 11  . Probiotic Product (ALIGN) 4 MG CAPS Take 1 tablet by mouth daily. Once a day    . propylthiouracil (PTU) 50 MG tablet take 1/2 tablet (25 mg) twice daily 30 tablet 5  . pseudoephedrine-guaifenesin (MUCINEX D) 60-600 MG 12 hr tablet Take 1 tablet by mouth every 12 (twelve) hours. 60 tablet 5  . ranitidine (ZANTAC) 300 MG tablet Take 1 tablet (300 mg total) by mouth daily. 30 tablet 5  . Respiratory Therapy Supplies (FLUTTER) DEVI Use as directed 1 each 0  .  Simethicone (GAS-X EXTRA STRENGTH) 125 MG CAPS Take 2 each by mouth daily as needed. For gas relief    . [DISCONTINUED] methimazole (TAPAZOLE) 5 MG tablet Take 5 mg by mouth 3 (three) times daily.       No current facility-administered medications on file prior to visit.   Allergies  Allergen Reactions  . Dust Mite Extract Shortness Of Breath and Other (See Comments)    "sneezing" (02/20/2012)  . Molds & Smuts Shortness Of Breath  . Morphine Sulfate Itching  . Other Shortness Of Breath and Other (See Comments)    Grass and weeds "sneezing; filled sinuses" (02/20/2012)  . Penicillins Rash and Other (See Comments)    "welts" (02/20/2012)  . Reclast [Zoledronic Acid]     Put in hospital  . Rofecoxib Swelling    Celebrex REACTION: feet swelling  . Shrimp Flavor Anaphylaxis    ALL SHELLFISH  . Tetracycline Hcl Nausea And Vomiting  . Dilaudid [Hydromorphone Hcl] Itching  . Hydrocodone-Acetaminophen Nausea And Vomiting  . Levofloxacin Other (See Comments)    REACTION: GI upset  . Oxycodone Hcl Nausea And Vomiting  . Paroxetine Nausea And Vomiting    Paxil   . Diltiazem Swelling  . Tree Extract Other (See Comments)    "tested and told I was allergic to it; never experienced a reaction to it" (02/20/2012)   Social History   Social History  . Marital Status: Divorced    Spouse Name: N/A  . Number of Children: 2  . Years of Education: college   Occupational History  . Disabled     Retired  Print production planner  .     Social History Main Topics  . Smoking status: Passive Smoke Exposure - Never Smoker  . Smokeless tobacco: Never Used     Comment: Parents  . Alcohol Use: 0.0 oz/week    0 Standard drinks or equivalent per week     Comment: 02/20/2012 "couple glasses of wine/6 months"  . Drug Use: No  . Sexual Activity: No   Other Topics Concern  . Not on file   Social History Narrative   Patient lives at home alone. Patient  divorced.    Patient has her BS degree.   Right handed.   Caffeine- sometimes coffee.      [facility]:   Born in [place], [place]. She worked as a Copywriter, advertising. She has no pets currently. She does have indoor plants. Previously had mold in her home that was remediated. Carpet was removed.             Family History  Problem Relation Age of Onset  . Allergies Mother   . Heart disease Mother   . Arthritis Mother   . Lung cancer Mother   . Diabetes Mother   . Allergies Father   . Heart disease Father   . Arthritis Father   . Stroke Father   . Colon cancer      Maternal half aunt/Maternal half uncle  . Colitis Daughter   . Diabetes Maternal Grandfather       Review of Systems  All other systems reviewed and are negative.      Objective:   Physical Exam  Constitutional: She is oriented to person, place, and time. She appears well-developed and well-nourished.  HENT:  Head: Normocephalic and atraumatic.  Right Ear: External ear normal.  Left Ear: External ear normal.  Nose: Nose normal.  Mouth/Throat: Oropharynx is clear and moist. No oropharyngeal exudate.  Eyes: Conjunctivae and EOM are normal. Pupils are equal, round, and reactive to light. Right eye exhibits no discharge. Left eye exhibits no discharge. No scleral icterus.  Neck: Normal range of motion. Neck supple. No JVD present. No tracheal deviation present. No thyromegaly present.  Cardiovascular: Normal rate, regular rhythm, normal heart sounds and  intact distal pulses.  Exam reveals no gallop and no friction rub.   No murmur heard. Pulmonary/Chest: Effort normal and breath sounds normal. No stridor. No respiratory distress. She has no wheezes. She has no rales. She exhibits no tenderness.  Abdominal: Soft. Bowel sounds are normal. She exhibits no distension and no mass. There is no tenderness. There is no rebound and no guarding.  Musculoskeletal: Normal range of motion. She exhibits no edema or tenderness.  Lymphadenopathy:    She has no cervical adenopathy.  Neurological: She is alert and oriented to person, place, and time. She has normal reflexes. She displays normal reflexes. No cranial nerve deficit. She exhibits normal muscle tone. Coordination normal.  Skin: Skin is warm. No rash noted. She is not diaphoretic. No erythema. No pallor.  Psychiatric: She has a normal mood and affect. Her behavior is normal. Judgment and thought content normal.  Vitals reviewed.         Assessment & Plan:  Routine general medical examination at a health care facility - Plan: CBC with Differential/Platelet, COMPLETE METABOLIC PANEL WITH GFR  Hyperthyroidism - Plan: TSH  HLD (hyperlipidemia) - Plan: CBC with Differential/Platelet, COMPLETE METABOLIC PANEL WITH GFR, Lipid panel  Physical exam today is normal. Regular anticipatory guidance is provided. Cancer screening is up-to-date. Immunizations are up-to-date. She declines a colonoscopy and hepatitis C screening. I will check a CBC, CMP, fasting lipid panel. I will monitor her TSH and her treatment for hyperthyroidism. I did give the patient a prescription for prednisone in case she gets a pseudogout flare while I'm out of the office next week. I believe the likelihood of this is unlikely. Also refilled her hemorrhoid cream.

## 2015-08-15 ENCOUNTER — Telehealth: Payer: Self-pay

## 2015-08-15 NOTE — Telephone Encounter (Signed)
Spoke with pt to give her CT results. She states that she has been using Spiriva since her LOV and her breathing has not improved. Pt would like to know what else she can do or if there is another medication she should try.   JN please advise. Thanks!

## 2015-08-16 ENCOUNTER — Encounter: Payer: Self-pay | Admitting: Family Medicine

## 2015-08-16 NOTE — Telephone Encounter (Signed)
Please ask her if she is having any snoring or witnessed apneic spells while sleeping. Ask her if she is having any morning headaches, taking daytime naps, or dozes off easily. I'm suspicious she may have sleep apnea that may be making her asthma worse. Let me know then I will decide on an alternative inhaler. Thanks.

## 2015-08-17 NOTE — Telephone Encounter (Signed)
Called spoke with pt. She reports she does not believe she snores and has not had any witnessed apneic spells. She denies having any morning HA, does not take daytime naps and does not doze off easily. Please advise Dr. Ashok Cordia thanks

## 2015-08-18 NOTE — Telephone Encounter (Signed)
If we have a sample of Tudorza give her one to try with instructions to do 1 inhalation twice daily. Thanks.

## 2015-08-18 NOTE — Telephone Encounter (Signed)
Called spoke with pt. Left sample of tudorza for pick up. Pt will need to be shown how to use this. Nothing further needed

## 2015-08-22 ENCOUNTER — Ambulatory Visit (INDEPENDENT_AMBULATORY_CARE_PROVIDER_SITE_OTHER): Payer: Medicare Other

## 2015-08-22 DIAGNOSIS — J4540 Moderate persistent asthma, uncomplicated: Secondary | ICD-10-CM

## 2015-08-22 MED ORDER — OMALIZUMAB 150 MG ~~LOC~~ SOLR
150.0000 mg | Freq: Once | SUBCUTANEOUS | Status: AC
  Administered 2015-08-22: 150 mg via SUBCUTANEOUS

## 2015-09-01 ENCOUNTER — Telehealth: Payer: Self-pay | Admitting: Pulmonary Disease

## 2015-09-01 NOTE — Telephone Encounter (Signed)
#   vials:1 Ordered date:09/01/15 Shipping Date:09/01/15

## 2015-09-02 ENCOUNTER — Telehealth: Payer: Self-pay | Admitting: Pulmonary Disease

## 2015-09-02 DIAGNOSIS — J302 Other seasonal allergic rhinitis: Secondary | ICD-10-CM

## 2015-09-02 DIAGNOSIS — J3089 Other allergic rhinitis: Secondary | ICD-10-CM

## 2015-09-02 MED ORDER — PREDNISONE 10 MG PO TABS: ORAL_TABLET | ORAL | Status: DC

## 2015-09-02 NOTE — Telephone Encounter (Signed)
Patient aware of Dr. Ammie Dalton recommendations. Orders for Sputum Cx/ AFB/ Fungus has been placed. Patient will collect sputum and bring to lab same day per lab instructions. Rx for Prednisone sent to pharmacy. Patient aware. Nothing further needed.

## 2015-09-02 NOTE — Telephone Encounter (Signed)
Spoke with pt to relay below recs- pt refused, saying she could not afford another office visit and is requesting recs over the phone. JN please advise.  Thanks

## 2015-09-02 NOTE — Telephone Encounter (Signed)
Let's schedule the patient to be seen next week by available provider. If her breathing gets worse over the weekend she should call back or be seen in the emergency department. Thanks.

## 2015-09-02 NOTE — Telephone Encounter (Signed)
#   Vials:1 Arrival Date:09/02/15 Lot [id number] Exp Date:12/20

## 2015-09-02 NOTE — Telephone Encounter (Signed)
Let's order a sputum culture for AFB, Fungus, & Bacteria again. Please send in a Rx for Prednisone 40mg  daily x 4 days, then 30mg  daily x4 days, then 20mg  daily x4 days, then 10mg  daily x4 days. Thanks.

## 2015-09-02 NOTE — Telephone Encounter (Signed)
Called and spoke with pt and she stated that the tudorza is not helping her.  She stated that she feels that her breathing is a little bit worse.  She is still coughing with yellow sputum.  Pt stated that she has not had any fever.  She stated that she was to call on Monday to give Dr. Ashok Cordia an update but the pt felt that she could not wait until then.  Please advise. Thanks  Allergies  Allergen Reactions  . Dust Mite Extract Shortness Of Breath and Other (See Comments)    "sneezing" (02/20/2012)  . Molds & Smuts Shortness Of Breath  . Morphine Sulfate Itching  . Other Shortness Of Breath and Other (See Comments)    Grass and weeds "sneezing; filled sinuses" (02/20/2012)  . Penicillins Rash and Other (See Comments)    "welts" (02/20/2012)  . Reclast [Zoledronic Acid]     Put in hospital  . Rofecoxib Swelling    Celebrex REACTION: feet swelling  . Shrimp Flavor Anaphylaxis    ALL SHELLFISH  . Tetracycline Hcl Nausea And Vomiting  . Dilaudid [Hydromorphone Hcl] Itching  . Hydrocodone-Acetaminophen Nausea And Vomiting  . Levofloxacin Other (See Comments)    REACTION: GI upset  . Oxycodone Hcl Nausea And Vomiting  . Paroxetine Nausea And Vomiting    Paxil   . Diltiazem Swelling  . Tree Extract Other (See Comments)    "tested and told I was allergic to it; never experienced a reaction to it" (02/20/2012)

## 2015-09-05 ENCOUNTER — Telehealth: Payer: Self-pay | Admitting: Pulmonary Disease

## 2015-09-05 NOTE — Telephone Encounter (Signed)
Spoke with pt, states that she is having trouble producing 1 oz of mucus for her sputum sample, requesting something to help her produce more mucus.  I advised that she did not need 1 oz of mucus, just needed to produce approx a quarter sized amount of mucus.  Pt stated that I was incorrect and that she was told by the lab to produce 1 oz of mucus, and asked me to double check with the lab.  I spoke with Cassie in the lab who verified that a quarter sized amount of mucus is sufficient. lmtcb X1 for pt to make aware of recs.

## 2015-09-06 ENCOUNTER — Encounter: Payer: Self-pay | Admitting: Allergy and Immunology

## 2015-09-06 ENCOUNTER — Ambulatory Visit (INDEPENDENT_AMBULATORY_CARE_PROVIDER_SITE_OTHER): Payer: Medicare Other | Admitting: Allergy and Immunology

## 2015-09-06 ENCOUNTER — Other Ambulatory Visit: Payer: Medicare Other

## 2015-09-06 VITALS — BP 172/78 | HR 84 | Resp 20

## 2015-09-06 DIAGNOSIS — J3089 Other allergic rhinitis: Secondary | ICD-10-CM

## 2015-09-06 DIAGNOSIS — J4551 Severe persistent asthma with (acute) exacerbation: Secondary | ICD-10-CM

## 2015-09-06 DIAGNOSIS — G500 Trigeminal neuralgia: Secondary | ICD-10-CM

## 2015-09-06 DIAGNOSIS — G518 Other disorders of facial nerve: Secondary | ICD-10-CM | POA: Diagnosis not present

## 2015-09-06 DIAGNOSIS — K219 Gastro-esophageal reflux disease without esophagitis: Secondary | ICD-10-CM

## 2015-09-06 DIAGNOSIS — J302 Other seasonal allergic rhinitis: Secondary | ICD-10-CM

## 2015-09-06 DIAGNOSIS — J387 Other diseases of larynx: Secondary | ICD-10-CM | POA: Diagnosis not present

## 2015-09-06 MED ORDER — MUPIROCIN 2 % EX OINT: TOPICAL_OINTMENT | CUTANEOUS | Status: DC

## 2015-09-06 NOTE — Patient Instructions (Addendum)
  1. 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 Continue Periactin 4 mg tablet  2 tablets at bedtime  2. 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 Continue Dexilant 60 mg in the morning  B. Continue ranitidine 300 mg in the evening  4. Continue Dulera 200 2 inhalations twice a day  5. Continue Nasonex one spray each nostril twice a day but decrease to 1 time per day if nasal irritation or bleeding  6. Continue montelukast 10 mg daily  7. Continue nystatin swish and spit twice a day  8. Continue Xolair and EpiPen as prescribed by Dr. Ashok Cordia  9. Use bronchodilator and Astelin and Zyrtec if needed  10. Use prednisone 10 mg tablet, 2 tablets once a day for 10 days  11. Use Bactroban ointment applied inside nose 2-3 times a day when irritated preceded by nasal saline spray  12. Return to clinic in 12 weeks or earlier if problem  13. Obtain fall flu vaccine

## 2015-09-06 NOTE — Telephone Encounter (Signed)
Called and spoke with pt. Aware of below. Nothing further needed

## 2015-09-06 NOTE — Progress Notes (Signed)
Follow-up Note  Referring Provider: [name], MD Primary Provider: Odette Fraction, MD Date of Office Visit: 09/06/2015  Subjective:   Ebony Scott (DOB: 06-07-1950) is a 65 y.o. female who returns to the [facility] on 09/06/2015 in re-evaluation of the following:  HPI: Aysia returns to this clinic in evaluation of her facial pain syndrome, allergic rhinoconjunctivitis, reflux-induced respiratory disease, and asthma followed by Dr. Ashok Cordia treated with Xolair. I have not seen her in his clinic since April 2017.  The big issue for [name] at this point in time his loss of control regarding her asthma. Apparently sometime in the early part of June she started to develop problems with coughing and being out of breath and she would not have the energy to really do much and cannot really exert herself to any large degree. She has been working with Dr. Ashok Cordia who has added Spiriva, added Caprice Renshaw, has examined her sputum, and recommended prednisone. The 2 anticholinergic agents actually made her worse. She has not used her prednisone because she does not want to use such a high dose of prednisone as recommended by Dr. Milinda Hirschfeld. She does not use a short acting bronchodilator because her previous pulmonologist recommended that she only uses a short acting bronchodilator whenever her peak flow drops below 250 and she presents a chart today with measurements of her peak flow on a consistent basis graft out showing that she never dropped below 250. She continues on Dulera 200 consistently. She still occasionally uses nystatin swish and spit to treat what appears to be recurrent thrush.  Regarding her other issues, Edie is actually had very good control of her sleep and facial pain issue while using cyproheptadine 8 mg at bedtime. She still occasionally has a little bit of irritation and tenderness affecting her left maxillary sinus. She sometimes gets circles around her eyes. She has  had some problems with epistaxis recently. She does continue to use a nasal steroid twice a day and continues on montelukast as well.  Her reflux is actually doing relatively well. She's not had any episodes of nasopharyngeal regurgitation. She continues on the Dexilant and ranitidine.    Medication List           albuterol 108 (90 Base) MCG/ACT inhaler  Commonly known as:  PROAIR HFA  Inhale 1-2 puffs into the lungs every 6 (six) hours as needed for wheezing or shortness of breath.     ALIGN 4 MG Caps  Take 1 tablet by mouth daily. Once a day     Alpha-Lipoic Acid 600 MG Caps  Take 600 mg by mouth daily. Once a day     BEANO Tabs  Take 1 tablet by mouth daily.     cetirizine 10 MG tablet  Commonly known as:  ZYRTEC  Take 1/2 tablet daily     CITRACAL MAXIMUM 315-250 MG-UNIT Tabs  Generic drug:  Calcium Citrate-Vitamin D  Take 1 tablet by mouth 2 (two) times daily.     colchicine 0.6 MG tablet  Take 1 tablet (0.6 mg total) by mouth daily.     cyproheptadine 4 MG tablet  Commonly known as:  PERIACTIN  Take two tablets every evening     denosumab 60 MG/ML Soln injection  Commonly known as:  PROLIA  Inject 60 mg into the skin every 6 (six) months. Administer in upper arm, thigh, or abdomen     DEXILANT 60 MG capsule  Generic drug:  dexlansoprazole  Take 60 mg by mouth daily.     EPINEPHrine 0.3 mg/0.3 mL Soaj injection  Commonly known as:  EPIPEN 2-PAK  Inject 0.3 mLs (0.3 mg total) into the muscle once.     EYE DROPS OP  Apply to eye.     FLUTTER Devi  Use as directed     gabapentin 600 MG tablet  Commonly known as:  NEURONTIN  Take 1 tablet (600 mg total) by mouth daily.     GAS-X EXTRA STRENGTH 125 MG Caps  Generic drug:  Simethicone  Take 2 each by mouth daily as needed. For gas relief     hydrocortisone 2.5 % rectal cream  Commonly known as:  PROCTOZONE-HC  Apply as needed as directed     lactase 3000 units tablet  Commonly known as:  LACTAID    Take 3 tablets by mouth as needed. 3-4 tabs with dairy liquids and solids     lipase/protease/amylase 12000 units Cpep capsule  Commonly known as:  CREON  Take 1 capsule (12,000 Units total) by mouth 3 (three) times daily. CREON 24000-76000-120000 U Takes with meals     mirtazapine 30 MG disintegrating tablet  Commonly known as:  REMERON SOLTAB  Take 1 tablet (30 mg total) by mouth at bedtime.     mometasone 50 MCG/ACT nasal spray  Commonly known as:  NASONEX  Place 2 sprays into the nose 2 (two) times daily.     mometasone-formoterol 200-5 MCG/ACT Aero  Commonly known as:  DULERA  Inhale 2 puffs into the lungs 2 (two) times daily.     montelukast 10 MG tablet  Commonly known as:  SINGULAIR  TAKE 1 TABLET BY MOUTH ONCE DAILY     MUCINEX PO  Take by mouth 2 (two) times daily.     neomycin-bacitracin-polymyxin ointment  Commonly known as:  NEOSPORIN  Apply 1 application topically every 12 (twelve) hours. Use on/in nose as directed as needed     nystatin 100000 UNIT/ML suspension  Commonly known as:  MYCOSTATIN  Use as directed 5 mLs (500,000 Units total) in the mouth or throat 4 (four) times daily. Reported on 03/08/2015     nystatin-triamcinolone ointment  Commonly known as:  MYCOLOG  Apply topically 2 (two) times daily. Reported on 03/08/2015     OTOVEL OT  Place in ear(s). As directed by Dr. Thornell Mule     pravastatin 20 MG tablet  Commonly known as:  PRAVACHOL  Take 20 mg by mouth daily.     predniSONE 20 MG tablet  Commonly known as:  DELTASONE  3 tabs poqday 1-2, 2 tabs poqday 3-4, 1 tab poqday 5-6     predniSONE 10 MG tablet  Commonly known as:  DELTASONE  Take 40mg  daily x 4 days, 30mg  x 4 days, 20mg  x 4 days, 10mg  x 4 days, then STOP     propylthiouracil 50 MG tablet  Commonly known as:  PTU  take 1/2 tablet (25 mg) twice daily     ranitidine 300 MG tablet  Commonly known as:  ZANTAC  Take 1 tablet (300 mg total) by mouth daily.     SYSTANE ULTRA 0.4-0.3 %  Soln  Generic drug:  Polyethyl Glycol-Propyl Glycol  Apply 1 drop to eye 2 (two) times daily as needed. 1-2 drops each eye every morning and at bedtime     TYLENOL ARTHRITIS PAIN 650 MG CR tablet  Generic drug:  acetaminophen  Take 650 mg by mouth 2 (two) times daily.  VINATE M 27-1 MG Tabs  Take 1 tablet by mouth daily.     Vitamin D 2000 units Caps  1/2 at bedtime     XOLAIR 150 MG injection  Generic drug:  omalizumab  Inject 150 mg into the skin every 28 (twenty-eight) days. 150 SQ every 4 weeks        Past Medical History  Diagnosis Date  . Chronic neck pain   . Allergic rhinitis   . Moderate persistent asthma     -FeV1 72% 2011, -IgE 102 2011, CT sinus Neg 2011  . GERD (gastroesophageal reflux disease)   . Hypothyroidism   . Osteoporosis     on reclast yearly  . Hiatal hernia   . Diverticulosis   . Hemorrhoids   . Asthma   . IBS (irritable bowel syndrome)   . Complication of anesthesia     "had hard time waking up from it several times" (02/20/2012)  . Hypercholesteremia   . Pneumonia 04/2011; ~ 11/2011    "double; single" (02/20/2012)  . Exertional dyspnea   . Graves disease   . Headache(784.0)     "related to allergies; more at different times during the year" (02/20/2012)  . Arthritis     "mostly the hands" (02/20/2012)  . Fibromyalgia 11/2011  . Chronic lower back pain   . Depression     "some; don't take anything for it" (02/20/2012)  . Breast cancer ([place]) 1998    in remission  . Anxiety   . DVT (deep venous thrombosis) ([place])   . Allergy     SEASONAL  . Anemia   . Cataract     REMOVED    Past Surgical History  Procedure Laterality Date  . Cervical fusion  2003    C3-C4  . Cervical discectomy  10/2001    C5-C6  . Vesicovaginal fistula closure w/ tah  1988  . Nasal septum surgery  1980's  . Anterior and posterior repair  1990's  . Breast lumpectomy  1998    left  . Debridement tennis elbow  ?1970's    right  . Knee arthroplasty  ?1990's     "?right; w/cartilage repair" (02/20/2012)  . Carpometacarpel ([facility]) fusion of thumb with autograft from radius  ~ 2009    "both thumbs" (02/20/2012)  . Cataract extraction w/ intraocular lens  implant, bilateral  2012  . Tonsillectomy  ~ 1953  . Posterior cervical fusion/foraminotomy  2004    "failed initial fusion; rewired  anterior neck" (02/20/2012)  . Colonoscopy    . Appendectomy    . Cholecystectomy    . Hysterectomy    . Esophagogastroduodenoscopy      Allergies  Allergen Reactions  . Dust Mite Extract Shortness Of Breath and Other (See Comments)    "sneezing" (02/20/2012)  . Molds & Smuts Shortness Of Breath  . Morphine Sulfate Itching  . Other Shortness Of Breath and Other (See Comments)    Grass and weeds "sneezing; filled sinuses" (02/20/2012)  . Penicillins Rash and Other (See Comments)    "welts" (02/20/2012)  . Reclast [Zoledronic Acid]     Put in hospital  . Rofecoxib Swelling    Celebrex REACTION: feet swelling  . Shrimp Flavor Anaphylaxis    ALL SHELLFISH  . Tetracycline Hcl Nausea And Vomiting  . Dilaudid [Hydromorphone Hcl] Itching  . Hydrocodone-Acetaminophen Nausea And Vomiting  . Levofloxacin Other (See Comments)    REACTION: GI upset  . Oxycodone Hcl Nausea And Vomiting  . Paroxetine Nausea And  Vomiting    Paxil   . Diltiazem Swelling  . Tree Extract Other (See Comments)    "tested and told I was allergic to it; never experienced a reaction to it" (02/20/2012)    Review of systems negative except as noted in HPI / PMHx or noted below:  Review of Systems  Constitutional: Negative.   HENT: Negative.   Eyes: Negative.   Respiratory: Negative.   Cardiovascular: Negative.   Gastrointestinal: Negative.   Genitourinary: Negative.   Musculoskeletal: Negative.   Skin: Negative.   Neurological: Negative.   Endo/Heme/Allergies: Negative.   Psychiatric/Behavioral: Negative.      Objective:   Filed Vitals:   09/06/15 1115  BP: 172/78  Pulse: 84  Resp:  20          Physical Exam  Constitutional: She is well-developed, well-nourished, and in no distress.  HENT:  Head: Normocephalic.  Right Ear: Tympanic membrane, external ear and ear canal normal.  Left Ear: Tympanic membrane, external ear and ear canal normal.  Nose: Mucosal edema (Erythematous excoriated nasal mucosa bilaterally) present. No rhinorrhea.  Mouth/Throat: Uvula is midline, oropharynx is clear and moist and mucous membranes are normal. No oropharyngeal exudate.  Eyes: Conjunctivae are normal.  Neck: Trachea normal. No tracheal tenderness present. No tracheal deviation present. No thyromegaly present.  Cardiovascular: Normal rate, regular rhythm, S1 normal, S2 normal and normal heart sounds.   No murmur heard. Pulmonary/Chest: Breath sounds normal. No stridor. No respiratory distress. She has no wheezes. She has no rales.  Musculoskeletal: She exhibits no edema.  Lymphadenopathy:       Head (right side): No tonsillar adenopathy present.       Head (left side): No tonsillar adenopathy present.    She has no cervical adenopathy.  Neurological: She is alert. Gait normal.  Skin: No rash noted. She is not diaphoretic. No erythema. Nails show no clubbing.  Psychiatric: Mood and affect normal.    Diagnostics:    Spirometry was performed and demonstrated an FEV1 of 1.68 at 80 % of predicted.  The patient had an Asthma Control Test with the following results: ACT Total Score: 10.    Assessment and Plan:   1. Asthma, not well controlled, severe persistent, with acute exacerbation   2. Other allergic rhinitis   3. Facial pain syndrome   4. LPRD (laryngopharyngeal reflux disease)     1. 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 Continue Periactin 4 mg tablet  2 tablets at bedtime  2. 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 Continue Dexilant 60 mg in the morning  B. Continue ranitidine 300 mg in the evening  4. Continue Dulera 200 2  inhalations twice a day  5. Continue Nasonex one spray each nostril twice a day but decrease to 1 time per day if nasal irritation or bleeding  6. Continue montelukast 10 mg daily  7. Continue nystatin swish and spit twice a day If needed  8. Continue Xolair and EpiPen as prescribed by Dr. Ashok Cordia  9. Use bronchodilator and Astelin and Zyrtec if needed  10. Use prednisone 10 mg tablet, 2 tablets once a day for 10 days  11. Use Bactroban ointment applied inside nose 2-3 times a day when irritated preceded by nasal saline spray  12. Return to clinic in 12 weeks or earlier if problem  13. Obtain fall flu vaccine  Several issues with Onie need to be addressed today. First, I've given her a systemic steroids  at a relatively low dose and she appeared to be agreeable to using this dose instead of the high dose recommended by Dr. Ashok Cordia. She has significant problems with systemic steroids and she would like to minimize the side effects by not taking a dose above 20 mg. Second, she appears to have some irritation of her nasal mucosa and she will decrease her dose of Nasonex and I will have her use Bactroban ointment to clear up this issue. All of her other issues appear to be stable and she'll continue to use Periactin for her facial pain and sleep dysfunction and continue to use a proton pump inhibitor and an H2 receptor blocker for her LPR and we'll continue to use medical therapy prescribed by Dr. Ashok Cordia for her asthma. I'll see her back in this clinic in 12 weeks or earlier if there is a problem.  Allena Katz, MD [facility]

## 2015-09-06 NOTE — Telephone Encounter (Signed)
Pt returning call would like a call back before you start seeing pt can be reached @ hm # [phone] has a doc appoint this morning ok to leave a msg if no answer.Ebony Scott

## 2015-09-07 ENCOUNTER — Telehealth: Payer: Self-pay | Admitting: Pulmonary Disease

## 2015-09-07 ENCOUNTER — Other Ambulatory Visit: Payer: Self-pay | Admitting: *Deleted

## 2015-09-07 MED ORDER — MOMETASONE FURO-FORMOTEROL FUM 200-5 MCG/ACT IN AERO
2.0000 | INHALATION_SPRAY | Freq: Two times a day (BID) | RESPIRATORY_TRACT | Status: DC
Start: 2015-09-07 — End: 2016-03-16

## 2015-09-07 NOTE — Telephone Encounter (Signed)
Spoke with pt and she thought we had called her with sputum culture results. Advised pt that since this was just given to the lab yesterday there will not be any results for several weeks. Pt voiced understanding. Nothing further needed.

## 2015-09-09 LAB — RESPIRATORY CULTURE OR RESPIRATORY AND SPUTUM CULTURE: Organism ID, Bacteria: NORMAL

## 2015-09-12 ENCOUNTER — Telehealth: Payer: Self-pay | Admitting: Pulmonary Disease

## 2015-09-12 NOTE — Telephone Encounter (Signed)
Called spoke with Terri at [facility]. She states that they needed clarification that Sputum is the only speciemen sent. I reviewed the orders with her. She voiced understanding and had no further questions. Nothing further needed.

## 2015-09-20 ENCOUNTER — Ambulatory Visit (INDEPENDENT_AMBULATORY_CARE_PROVIDER_SITE_OTHER): Payer: Medicare Other

## 2015-09-20 DIAGNOSIS — J4540 Moderate persistent asthma, uncomplicated: Secondary | ICD-10-CM

## 2015-09-20 MED ORDER — OMALIZUMAB 150 MG ~~LOC~~ SOLR
150.0000 mg | SUBCUTANEOUS | Status: DC
  Administered 2015-09-20: 150 mg via SUBCUTANEOUS

## 2015-09-22 ENCOUNTER — Telehealth: Payer: Self-pay | Admitting: Family Medicine

## 2015-09-22 NOTE — Telephone Encounter (Signed)
error 

## 2015-09-26 LAB — HM MAMMOGRAPHY

## 2015-09-28 ENCOUNTER — Telehealth: Payer: Self-pay | Admitting: Pulmonary Disease

## 2015-09-28 NOTE — Telephone Encounter (Signed)
#   vials:1 Ordered date:09/28/15 Shipping Date:09/29/15

## 2015-09-29 NOTE — Telephone Encounter (Signed)
#   Vials:1 Arrival Date:09/1015 Lot [id number] Exp Date:04/2019

## 2015-10-07 ENCOUNTER — Encounter: Payer: Self-pay | Admitting: *Deleted

## 2015-10-08 ENCOUNTER — Telehealth: Payer: Self-pay | Admitting: Pulmonary Disease

## 2015-10-08 NOTE — Telephone Encounter (Addendum)
Called by [facility] lab that the sputum cultures from 09/06/15 is positive for AFB in liquid culture. Final ID is pending. A review of the chart does not show any risk factors for TB. I will send this message to Dr. Ashok Cordia for follow up.

## 2015-10-11 ENCOUNTER — Other Ambulatory Visit: Payer: Self-pay | Admitting: *Deleted

## 2015-10-11 MED ORDER — DEXLANSOPRAZOLE 60 MG PO CPDR: 60.0000 mg | DELAYED_RELEASE_CAPSULE | Freq: Every day | ORAL | 5 refills | Status: DC

## 2015-10-11 MED ORDER — CYPROHEPTADINE HCL 4 MG PO TABS: ORAL_TABLET | ORAL | 5 refills | Status: DC

## 2015-10-12 ENCOUNTER — Other Ambulatory Visit: Payer: Self-pay | Admitting: Family Medicine

## 2015-10-12 DIAGNOSIS — J302 Other seasonal allergic rhinitis: Secondary | ICD-10-CM

## 2015-10-12 DIAGNOSIS — J3089 Other allergic rhinitis: Secondary | ICD-10-CM

## 2015-10-12 LAB — AFB CULTURE WITH SMEAR (NOT AT ARMC)

## 2015-10-12 LAB — FUNGUS CULTURE W SMEAR

## 2015-10-12 MED ORDER — PROPYLTHIOURACIL 50 MG PO TABS: ORAL_TABLET | ORAL | 5 refills | Status: DC

## 2015-10-12 MED ORDER — MONTELUKAST SODIUM 10 MG PO TABS: ORAL_TABLET | ORAL | 11 refills | Status: DC

## 2015-10-12 MED ORDER — PRAVASTATIN SODIUM 20 MG PO TABS: 20.0000 mg | ORAL_TABLET | Freq: Every day | ORAL | 1 refills | Status: DC

## 2015-10-12 NOTE — Telephone Encounter (Signed)
Medication called/sent to requested pharmacy  

## 2015-10-19 ENCOUNTER — Ambulatory Visit (INDEPENDENT_AMBULATORY_CARE_PROVIDER_SITE_OTHER): Payer: Medicare Other

## 2015-10-19 DIAGNOSIS — J4540 Moderate persistent asthma, uncomplicated: Secondary | ICD-10-CM | POA: Diagnosis not present

## 2015-10-19 MED ORDER — OMALIZUMAB 150 MG ~~LOC~~ SOLR
150.0000 mg | SUBCUTANEOUS | Status: DC
  Administered 2015-10-19: 150 mg via SUBCUTANEOUS

## 2015-10-21 ENCOUNTER — Telehealth: Payer: Self-pay | Admitting: Pulmonary Disease

## 2015-10-21 DIAGNOSIS — J4550 Severe persistent asthma, uncomplicated: Secondary | ICD-10-CM

## 2015-10-21 NOTE — Telephone Encounter (Signed)
Patient had this done 08/12/15

## 2015-10-21 NOTE — Telephone Encounter (Signed)
[phone], pt cb

## 2015-10-21 NOTE — Telephone Encounter (Signed)
Called and spoke with pt about her results.  She voiced her understanding and I have placed the order for the CT scan.  Pt is asking if she needs to keep the appt on 9/20 for the PFT?  She stated that she is having all of the tests done and they are coming up negative and she is having lots of costs out of pocket.  She stated that she cannot afford these tests.    JN please advise. thanks

## 2015-10-21 NOTE — Telephone Encounter (Signed)
Patient returning call. She can be reached at [phone]

## 2015-10-21 NOTE — Telephone Encounter (Signed)
Per result note from [facility]:  Please let the patient know that her sputum grew out a non-tuberculous mycobacteria. This could certainly be contributing to some of her symptoms. Her last CT Chest was in April 2014. If she's ok with it then I would like to repeat a CT Chest w/o Contrast to take a look at her lung tissue and see if there is any suggestion of this infection to guide our next step which could involve a bronchoscopy to get a more direct culture from her lungs and confirm her sputum culture since this is the first time this organism has been cultured. Thanks.  I have called and lmomtcb for pt.

## 2015-10-22 NOTE — Telephone Encounter (Signed)
That's fine we can hold off on her PFTs for now.

## 2015-10-25 ENCOUNTER — Telehealth: Payer: Self-pay | Admitting: Pulmonary Disease

## 2015-10-25 NOTE — Telephone Encounter (Signed)
Spoke with patient, cancelled PFT on 11/09/15.  Appt with JN kept as scheduled at 10:15 Nothing further needed.

## 2015-10-25 NOTE — Telephone Encounter (Signed)
Spoke with pt and clarified her sputum results as Mycobacteria per Dr Ashok Cordia.  PT would like to know if she is contagious In any way and if she is ok to be around her 67 yr old great nephew.  Please advise.

## 2015-10-26 ENCOUNTER — Telehealth: Payer: Self-pay | Admitting: Pulmonary Disease

## 2015-10-26 NOTE — Telephone Encounter (Signed)
Right now the only recommendations as far as being contagious is that you should not be around people who are immunosuppressed on medications for cancer, arthritis, etc. Or if they have an underlying lung disease such as cystic fibrosis.

## 2015-10-26 NOTE — Telephone Encounter (Signed)
Spoke with pt, aware of JN's recs.  Nothing further needed.

## 2015-10-27 NOTE — Telephone Encounter (Signed)
#   vials:1 Ordered date:10/26/15 Shipping Date:10/26/15 I didn't get the sheet to them in until today.

## 2015-10-27 NOTE — Telephone Encounter (Signed)
#   Vials:1 Arrival Date:10/27/15 Lot [id number] Exp Date:3/21

## 2015-11-03 ENCOUNTER — Ambulatory Visit (INDEPENDENT_AMBULATORY_CARE_PROVIDER_SITE_OTHER)
Admission: RE | Admit: 2015-11-03 | Discharge: 2015-11-03 | Disposition: A | Discharge status: Home / Self Care | Payer: Medicare Other | Source: Ambulatory Visit | Attending: Pulmonary Disease | Admitting: Pulmonary Disease

## 2015-11-03 DIAGNOSIS — R918 Other nonspecific abnormal finding of lung field: Secondary | ICD-10-CM | POA: Insufficient documentation

## 2015-11-03 DIAGNOSIS — J4550 Severe persistent asthma, uncomplicated: Secondary | ICD-10-CM | POA: Diagnosis not present

## 2015-11-09 ENCOUNTER — Ambulatory Visit (INDEPENDENT_AMBULATORY_CARE_PROVIDER_SITE_OTHER): Payer: Medicare Other | Admitting: Pulmonary Disease

## 2015-11-09 ENCOUNTER — Telehealth: Payer: Self-pay | Admitting: Pulmonary Disease

## 2015-11-09 VITALS — BP 150/70 | HR 67 | Wt 125.0 lb

## 2015-11-09 DIAGNOSIS — Z23 Encounter for immunization: Secondary | ICD-10-CM

## 2015-11-09 DIAGNOSIS — K219 Gastro-esophageal reflux disease without esophagitis: Secondary | ICD-10-CM

## 2015-11-09 DIAGNOSIS — R918 Other nonspecific abnormal finding of lung field: Secondary | ICD-10-CM

## 2015-11-09 DIAGNOSIS — J309 Allergic rhinitis, unspecified: Secondary | ICD-10-CM | POA: Diagnosis not present

## 2015-11-09 DIAGNOSIS — J4550 Severe persistent asthma, uncomplicated: Secondary | ICD-10-CM | POA: Diagnosis not present

## 2015-11-09 NOTE — Patient Instructions (Signed)
   Call me if you have any new breathing problems or questions before your next appointment.  Your sputum specimen should not be refrigerated & should be dropped off at the lab within 4 hours.  The procedure we are talking about is a bronchoscopy with lavage and transbronchial biopsies. It may require general anesthesia given your Morphine allergy.   I will see you back in 3 months or sooner if needed.   TESTS ORDERED: 1. Sputum Culture for AFB, Fungus, & Bacteria.

## 2015-11-09 NOTE — Telephone Encounter (Signed)
Called spoke with pt. JN answered patient questions regarding biopsy as she was on the phone. Nothing further needed

## 2015-11-09 NOTE — Progress Notes (Signed)
Subjective:    Patient ID: Ebony Scott, female    DOB: Jun 26, 1950, 65 y.o.   MRN: [id number]  C.C.:  Follow-up for Lung Opacities, Severe, Persistent Asthma, GERD, & Allergic Rhinitis.  HPI  Lung Opacities: Tree-in-bud seen in right upper lobe as well as linear opacities and right middle lobe & lingula. Sputum culture with significant epithelial cells but AFB culture positive for Mycobacterium gordonae. Denies any hemoptysis.   Severe, Persistent Asthma:  On Xolair therapy. Given sample of Spiriva Respimat to try at last appointment and Tudorza following that. She reports these don't seem to help her cough. Patient had increasing cough & was given a Rx for a short Prednisone taper in July. She only took 20mg  daily & feels it did help some. She reports she does continue to have a cough that is productive and seems to be worsening. She continues to have dyspnea. She hasn't used her rescue inhaler very frequently. She reports her mucus is yellow at times.  GERD:  Follows w/ GI & is treated with Dexilant & Ranitidine. She denies any reflux or dyspepsia. No morning brash water taste. Compliant with her medications.   Allergic Rhinitis:  Previously was evaluated by ENT & follows with Dr. Neldon Mc. On Zyrtec, Singulair, & Nasonex. Patient recommended to try Guaifenesin at last appointment to help with mucus.  Review of Systems She reports she has had sweats but no subjective fever or chills. She is continuing to have hot flashes. She denies any chest tightness, pain, or pressure. She does report some sores in her mouth as well as intermittent thrush.   Allergies  Allergen Reactions  . Dust Mite Extract Shortness Of Breath and Other (See Comments)    "sneezing" (02/20/2012)  . Molds & Smuts Shortness Of Breath  . Morphine Sulfate Itching  . Other Shortness Of Breath and Other (See Comments)    Grass and weeds "sneezing; filled sinuses" (02/20/2012)  . Penicillins Rash and Other (See Comments)   "welts" (02/20/2012)  . Reclast [Zoledronic Acid]     Put in hospital  . Rofecoxib Swelling    Celebrex REACTION: feet swelling  . Shrimp Flavor Anaphylaxis    ALL SHELLFISH  . Tetracycline Hcl Nausea And Vomiting  . Dilaudid [Hydromorphone Hcl] Itching  . Hydrocodone-Acetaminophen Nausea And Vomiting  . Levofloxacin Other (See Comments)    REACTION: GI upset  . Oxycodone Hcl Nausea And Vomiting  . Paroxetine Nausea And Vomiting    Paxil   . Diltiazem Swelling  . Tree Extract Other (See Comments)    "tested and told I was allergic to it; never experienced a reaction to it" (02/20/2012)    Current Outpatient Prescriptions on File Prior to Visit  Medication Sig Dispense Refill  . acetaminophen (TYLENOL ARTHRITIS PAIN) 650 MG CR tablet Take 650 mg by mouth 2 (two) times daily.     Marland Kitchen albuterol (PROAIR HFA) 108 (90 BASE) MCG/ACT inhaler Inhale 1-2 puffs into the lungs every 6 (six) hours as needed for wheezing or shortness of breath. 1 Inhaler 3  . Alpha-D-Galactosidase (BEANO) TABS Take 1 tablet by mouth daily.     . Alpha-Lipoic Acid 600 MG CAPS Take 600 mg by mouth daily. Once a day    . Calcium Citrate-Vitamin D (CITRACAL MAXIMUM) 315-250 MG-UNIT TABS Take 1 tablet by mouth 2 (two) times daily.     . cetirizine (ZYRTEC) 10 MG tablet Take 1/2 tablet daily    . Cholecalciferol (VITAMIN D) 2000 UNITS CAPS 1/2 at  bedtime    . Ciprofloxacin-Fluocinolone (OTOVEL OT) Place in ear(s). As directed by Dr. Thornell Mule    . colchicine 0.6 MG tablet Take 1 tablet (0.6 mg total) by mouth daily. 90 tablet 3  . cyproheptadine (PERIACTIN) 4 MG tablet Take two tablets every evening 60 tablet 5  . denosumab (PROLIA) 60 MG/ML SOLN injection Inject 60 mg into the skin every 6 (six) months. Administer in upper arm, thigh, or abdomen    . dexlansoprazole (DEXILANT) 60 MG capsule Take 1 capsule (60 mg total) by mouth daily. 30 capsule 5  . EPINEPHrine (EPIPEN 2-PAK) 0.3 mg/0.3 mL IJ SOAJ injection Inject 0.3 mLs  (0.3 mg total) into the muscle once. 1 Device 4  . gabapentin (NEURONTIN) 600 MG tablet Take 1 tablet (600 mg total) by mouth daily. 90 tablet 3  . GuaiFENesin (MUCINEX PO) Take by mouth 2 (two) times daily.    . hydrocortisone (PROCTOZONE-HC) 2.5 % rectal cream Apply as needed as directed 30 g 11  . lactase (LACTAID) 3000 UNITS tablet Take 3 tablets by mouth as needed. 3-4 tabs with dairy liquids and solids    . lipase/protease/amylase (CREON) 12000 UNITS CPEP capsule Take 1 capsule (12,000 Units total) by mouth 3 (three) times daily. CREON 24000-76000-120000 U Takes with meals 270 capsule 5  . mirtazapine (REMERON SOLTAB) 30 MG disintegrating tablet Take 1 tablet (30 mg total) by mouth at bedtime. 30 tablet 11  . mometasone (NASONEX) 50 MCG/ACT nasal spray Place 2 sprays into the nose 2 (two) times daily. 102 g 3  . mometasone-formoterol (DULERA) 200-5 MCG/ACT AERO Inhale 2 puffs into the lungs 2 (two) times daily. 1 Inhaler 5  . montelukast (SINGULAIR) 10 MG tablet TAKE 1 TABLET BY MOUTH ONCE DAILY 30 tablet 11  . mupirocin ointment (BACTROBAN) 2 % Apply inside nose two to three times a day when needed. 22 g 0  . neomycin-bacitracin-polymyxin (NEOSPORIN) ointment Apply 1 application topically every 12 (twelve) hours. Use on/in nose as directed as needed    . nystatin (MYCOSTATIN) 100000 UNIT/ML suspension Use as directed 5 mLs (500,000 Units total) in the mouth or throat 4 (four) times daily. Reported on 03/08/2015 473 mL 3  . nystatin-triamcinolone ointment (MYCOLOG) Apply topically 2 (two) times daily. Reported on 03/08/2015 30 g 1  . omalizumab (XOLAIR) 150 MG injection Inject 150 mg into the skin every 28 (twenty-eight) days. 150 SQ every 4 weeks    . Polyethyl Glycol-Propyl Glycol (SYSTANE ULTRA) 0.4-0.3 % SOLN Apply 1 drop to eye 2 (two) times daily as needed. 1-2 drops each eye every morning and at bedtime    . pravastatin (PRAVACHOL) 20 MG tablet Take 1 tablet (20 mg total) by mouth daily. 90  tablet 1  . Prenatal Vit-Sel-Fe Fum-FA (VINATE M) 27-1 MG TABS Take 1 tablet by mouth daily. 30 each 11  . Probiotic Product (ALIGN) 4 MG CAPS Take 1 tablet by mouth daily. Once a day    . propylthiouracil (PTU) 50 MG tablet take 1/2 tablet (25 mg) twice daily 30 tablet 5  . ranitidine (ZANTAC) 300 MG tablet Take 1 tablet (300 mg total) by mouth daily. 30 tablet 5  . Respiratory Therapy Supplies (FLUTTER) DEVI Use as directed 1 each 0  . Simethicone (GAS-X EXTRA STRENGTH) 125 MG CAPS Take 2 each by mouth daily as needed. For gas relief    . [DISCONTINUED] methimazole (TAPAZOLE) 5 MG tablet Take 5 mg by mouth 3 (three) times daily.  Current Facility-Administered Medications on File Prior to Visit  Medication Dose Route Frequency Provider Last Rate Last Dose  . omalizumab Arvid Right) injection 150 mg  150 mg Subcutaneous Q14 Days Javier Glazier, MD   150 mg at 09/20/15 1633  . omalizumab Arvid Right) injection 150 mg  150 mg Subcutaneous Q14 Days Javier Glazier, MD   150 mg at 10/19/15 1618    Past Medical History:  Diagnosis Date  . Allergic rhinitis   . Allergy    SEASONAL  . Anemia   . Anxiety   . Arthritis    "mostly the hands" (02/20/2012)  . Asthma   . Breast cancer ([place]) 1998   in remission  . Cataract    REMOVED  . Chronic lower back pain   . Chronic neck pain   . Complication of anesthesia    "had hard time waking up from it several times" (02/20/2012)  . Depression    "some; don't take anything for it" (02/20/2012)  . Diverticulosis   . DVT (deep venous thrombosis) ([place])   . Exertional dyspnea   . Fibromyalgia 11/2011  . GERD (gastroesophageal reflux disease)   . Graves disease   . Headache(784.0)    "related to allergies; more at different times during the year" (02/20/2012)  . Hemorrhoids   . Hiatal hernia   . Hypercholesteremia   . Hypothyroidism   . IBS (irritable bowel syndrome)   . Moderate persistent asthma    -FeV1 72% 2011, -IgE 102 2011, CT sinus Neg  2011  . Osteoporosis    on reclast yearly  . Pneumonia 04/2011; ~ 11/2011   "double; single" (02/20/2012)    Past Surgical History:  Procedure Laterality Date  . ANTERIOR AND POSTERIOR REPAIR  1990's  . APPENDECTOMY    . BREAST LUMPECTOMY  1998   left  . CARPOMETACARPEL ([place]) FUSION OF THUMB WITH AUTOGRAFT FROM RADIUS  ~ 2009   "both thumbs" (02/20/2012)  . CATARACT EXTRACTION W/ INTRAOCULAR LENS  IMPLANT, BILATERAL  2012  . CERVICAL DISCECTOMY  10/2001   C5-C6  . CERVICAL FUSION  2003   C3-C4  . CHOLECYSTECTOMY    . COLONOSCOPY    . DEBRIDEMENT TENNIS ELBOW  ?1970's   right  . ESOPHAGOGASTRODUODENOSCOPY    . HYSTERECTOMY    . KNEE ARTHROPLASTY  ?1990's   "?right; w/cartilage repair" (02/20/2012)  . NASAL SEPTUM SURGERY  1980's  . POSTERIOR CERVICAL FUSION/FORAMINOTOMY  2004   "failed initial fusion; rewired  anterior neck" (02/20/2012)  . TONSILLECTOMY  ~ 1953  . VESICOVAGINAL FISTULA CLOSURE W/ TAH  1988    Family History  Problem Relation Age of Onset  . Allergies Mother   . Heart disease Mother   . Arthritis Mother   . Lung cancer Mother   . Diabetes Mother   . Allergies Father   . Heart disease Father   . Arthritis Father   . Stroke Father   . Colon cancer      Maternal half aunt/Maternal half uncle  . Colitis Daughter   . Diabetes Maternal Grandfather     Social History   Social History  . Marital status: Divorced    Spouse name: N/A  . Number of children: 2  . Years of education: college   Occupational History  . Disabled     Retired Print production planner  .  Retired   Social History Main Topics  . Smoking status: Passive Smoke Exposure - Never Smoker  . Smokeless  tobacco: Never Used     Comment: Parents  . Alcohol use 0.0 oz/week     Comment: 02/20/2012 "couple glasses of wine/6 months"  . Drug use: No  . Sexual activity: No   Other Topics Concern  . Not on file   Social History Narrative   Patient lives at home alone. Patient   divorced.    Patient has her BS degree.   Right handed.   Caffeine- sometimes coffee.       Pulmonary:   Born in [place], [place]. She worked as a Copywriter, advertising. She has no pets currently. She does have indoor plants. Previously had mold in her home that was remediated. Carpet was removed.                Objective:   Physical Exam BP (!) 150/70 (BP Location: Left Arm, Cuff Size: Normal)   Pulse 67   Wt 125 lb (56.7 kg)   SpO2 99%   BMI 22.86 kg/m  General:  Awake. Alert. Thin, caucasian female.  Integument:  Warm & dry. No rash on exposed skin.  HEENT:  Moist mucus membranes. No oral ulcers. Minimal nasal turbinate swelling bilaterally. Cardiovascular:  Regular rate & rhythm. No edema. Normal S1 & S2. Pulmonary:  No accessory muscle use on room air. Clear on auscultation. Speaking in complete sentences. Abdomen: Soft. Normal bowel sounds. Nontender. Musculoskeletal:  Normal bulk and tone. No joint deformity or effusion appreciated.  PFT 08/08/15: FVC 2.08 L (75%) FEV1 1.73 L (82%) FEV1/FVC 0.83 FEF 25-75 2.41 L (129%) no bronchodilator response                                                                 DLCO corrected 66% (hemoglobin 10.5) 04/29/15: FVC 2.24 L (81%) FEV1 1.95 L (92%) FEV1/FVC 0.87 FEF 25-75 2.59 L (137%) no bronchodilator response TLC 3.91 L (84%) RV 83% ERV 61% DLCO corrected 62% (hemoglobin 13.7) 04/10/12:FVC 1.90 L (69%) FEV1 1.49 L (75%) FEV1/FVC 0.78 FEF 25-75 1.39 L (59%)                                                TLC 3.55 L (80%) RV 62% ERV 52% DLCO uncorrected 72%  6MWT 08/08/15:  Walked 267 meters / Baseline Sat 99% on RA / Nadir Sat 97% on RA  IMAGING CT CHEST W/O 11/03/15 (personally reviewed by me): No pleural effusion or thickening. No pathologic mediastinal adenopathy. No pericardial effusion. Tree-in-bud opacification within right upper lobe as well as patchy opacification linearly in right middle lobe and inferior lingula.  CT  MAXILLOFACIAL W/O 08/12/15 (per radiologist): Paranasal sinuses clear without mucosal thickening or air-fluid level.  CT MAXILLOFACIAL 05/21/13 (per radiologist): Negative for chronic sinus disease.  CT CHEST W/O 06/05/12 (per radiologist): Resolution of previous right apical ground glass. Persistent tree-in-bud nodularity within basal portion RUL.  MICROBIOLOGY Sputum Ctx (09/06/15):  Abundant Squamous Epithelials / Oral Flora / Mycobacterium gordonae / Fungus negative   LABS 11/09/14 BMP: 141/4.4/104/27/27/0.98/82/9.3 LFT: 4.1/6.5/0.08/17/28/31 CBC: 6.7/12.4/36.9/233  05/31/14 URINE TOX SCREEN: NEGATIVE  12/16/12 IgE: 206.6  04/01/12 RAST PANEL: RAGWEED 0.14 (class 0/1) IgE:  409.9 ASPERGILLUS ANTIGEN: 1.92  01/02/12 RAST PANEL: Weakly Pan-Positive Up to Class 1 IgE: 825.8  06/28/11 IgG: 27 IgA: 134    Assessment & Plan:  65 y.o. female with severe, persistent asthma, allergic rhinitis, & GERD. Patient's lung opacities and tree-in-bud opacity within right upper lobe could likely represent an atypical infection such as nontuberculous mycobacteria. 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 We did discuss performing bronchoscopy and I reviewed her chest CT scan with her today. Given her allergy to morphine this would need to be performed with general anesthesia. Patient is going to check with her insurance company to determine what caused her would be.  1. 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 Repeating sputum culture for AFB, fungus, and bacteria. Patient inquiring as to cost for bronchoscopy with lavage, & transbronchial biopsies under general anesthesia prior to scheduling. 2. Severe, Persistent Asthma: Continuing Singulair, Xolair, & Pro Air as prescribed. No changes. 3. Allergic Rhinitis: Patient continuing on Zyrtec, Nasonex, &  Singulair. No changes. 4. GERD: Continuing on Dexilant Zantac. Follows with GI. 5. Health Maintenance: S/P Prevnar March 2015, Tdap June 2015, & Pneumovax October 2013.  Administering high-dose influenza vaccine today. 6. Follow-up: Return to clinic in  3 months or sooner if needed.  Sonia Baller Ashok Cordia, M.D. [facility] Pager:  [phone] After 3pm or if no response, call [phone] 10:57 AM 11/09/15

## 2015-11-10 ENCOUNTER — Other Ambulatory Visit: Payer: PRIVATE HEALTH INSURANCE

## 2015-11-11 ENCOUNTER — Telehealth: Payer: Self-pay | Admitting: Pulmonary Disease

## 2015-11-11 NOTE — Telephone Encounter (Signed)
Spoke with pt. She reports she turned in a sputum sample yesterday. She wants to know if we should wait until these results come back or go ahead and schedule bronch? Pt reports she can do the date of 10/5. Please advise Dr. Ashok Cordia thanks

## 2015-11-14 NOTE — Telephone Encounter (Signed)
We can wait to see what the result returns with for now. Have her check back in 2-3 weeks to see if we have anything. Thanks.

## 2015-11-14 NOTE — Telephone Encounter (Signed)
Spoke with pt. She is aware of JN's recommendation. Nothing further was needed at this time.

## 2015-11-15 LAB — RESPIRATORY CULTURE OR RESPIRATORY AND SPUTUM CULTURE

## 2015-11-16 ENCOUNTER — Ambulatory Visit (INDEPENDENT_AMBULATORY_CARE_PROVIDER_SITE_OTHER): Payer: Medicare Other

## 2015-11-16 ENCOUNTER — Telehealth: Payer: Self-pay | Admitting: Pulmonary Disease

## 2015-11-16 DIAGNOSIS — J4540 Moderate persistent asthma, uncomplicated: Secondary | ICD-10-CM | POA: Diagnosis not present

## 2015-11-16 NOTE — Telephone Encounter (Signed)
Ebony Scott with [place] lab called with a report from [employer] as unable to run sputum culture d/t not having enough sputum in the sample.  Ebony Scott stated that there was too much saliva but not enough mucus.   Will send to Dr Ashok Cordia as Ebony Scott,

## 2015-11-17 NOTE — Telephone Encounter (Signed)
Called and spoke with the pt and notified of response from Sylvia  She states that the hospital cost is what is concerning her and keeping her from wanting to do procedure at this time  She is wondering if he could prescribe a med to help with producing sputum easier  She is taking mucinex bid already, and had a very hard time with trying to produce sample for [place]  JN, please advise thanks!

## 2015-11-17 NOTE — Telephone Encounter (Signed)
Please let the patient know her sputum specimen wasn't representative of material from her lower airways. If she's ok with it then we should proceed with bronchoscopy as planned but it is up to her. She will need a driver as she will not be able to drive after the procedure and it will be with general anesthesia as we discussed. Ask her for a list of days that will work over the next couple of weeks and I will look at my schedule. Thanks.

## 2015-11-18 ENCOUNTER — Encounter: Payer: Self-pay | Admitting: Pulmonary Disease

## 2015-11-18 MED ORDER — OMALIZUMAB 150 MG ~~LOC~~ SOLR
150.0000 mg | Freq: Once | SUBCUTANEOUS | Status: AC
  Administered 2015-11-16: 150 mg via SUBCUTANEOUS

## 2015-11-18 NOTE — Telephone Encounter (Signed)
LMTCB x1 for pt.  

## 2015-11-18 NOTE — Telephone Encounter (Signed)
I'm not sure if she has a flutter valve or acapella at home but if she doesn't we should prescribe her one and have her use this twice daily.

## 2015-11-21 NOTE — Telephone Encounter (Signed)
Spoke with pt. She is aware of JN's recommendation. She is already has a flutter valve. Pt was very upset with me. She is frustrated with JN. Feels like he is determined to do this procedure and not help her in any other way. She had several questions about the procedure and I answered them to be best of my ability. Pt continued to ask questions that would need to be answered by respiratory and when I advised her of this she became upset with me again. Will route this message to JN to make him aware of how unhappy this pt is.

## 2015-11-21 NOTE — Telephone Encounter (Signed)
LMTCB

## 2015-11-21 NOTE — Telephone Encounter (Signed)
Personally called the patient to address her multiple concerns. Firstly she questioned the need for an purpose of the bronchoscopy which I have discussed with her previously. We then addressed the fact that she could have an underlying atypical mycobacterial infection causing 80/contributing to her symptoms. She does report that she see started her flutter valve she has had improved mucus clearance. Because of this we will repeat a sputum culture for AFB, fungus, and bacteria. With regards to her concerns over her ongoing Xolair therapy, she questions the potential side effects of Xolair including possible TIA, blood clots, malignancy, etc. With her lengthy medical history the patient has had a DVT in the past and is concerned that Xolair poses a risk to her. I did advise her that she could stop Xolair any time that she wishes to given the cost/benefit ratio. We also discussed a referral to a tertiary care center to have her case evaluated by an outside pulmonologist. She is going to consider both stopping Xolair and a referral to a tertiary center for a second opinion.

## 2015-11-21 NOTE — Telephone Encounter (Signed)
Patient returning our call - pr  °

## 2015-11-21 NOTE — Telephone Encounter (Signed)
This message has been handled by telephone. Nothing further was needed.

## 2015-11-22 ENCOUNTER — Other Ambulatory Visit: Payer: PRIVATE HEALTH INSURANCE

## 2015-11-22 ENCOUNTER — Other Ambulatory Visit: Payer: Self-pay

## 2015-11-22 ENCOUNTER — Other Ambulatory Visit: Payer: Self-pay | Admitting: Pulmonary Disease

## 2015-11-22 DIAGNOSIS — J4550 Severe persistent asthma, uncomplicated: Secondary | ICD-10-CM

## 2015-11-23 LAB — AFB CULTURE WITH SMEAR (NOT AT ARMC)

## 2015-11-24 ENCOUNTER — Telehealth: Payer: Self-pay | Admitting: Pulmonary Disease

## 2015-11-24 ENCOUNTER — Encounter: Payer: Self-pay | Admitting: Pulmonary Disease

## 2015-11-24 NOTE — Telephone Encounter (Signed)
JN - Please see pt's email. Thanks!

## 2015-11-24 NOTE — Telephone Encounter (Signed)
#   vials:1 Ordered date:11/24/15 Shipping Date:11/25/15

## 2015-11-25 LAB — RESPIRATORY CULTURE OR RESPIRATORY AND SPUTUM CULTURE: Organism ID, Bacteria: NORMAL

## 2015-11-25 NOTE — Telephone Encounter (Signed)
#   Vials:1 Arrival Date:11/25/15 Lot [id number] Exp Date:4/21

## 2015-11-28 ENCOUNTER — Encounter: Payer: Self-pay | Admitting: Family Medicine

## 2015-11-28 DIAGNOSIS — Z79899 Other long term (current) drug therapy: Secondary | ICD-10-CM

## 2015-11-28 DIAGNOSIS — Z1159 Encounter for screening for other viral diseases: Secondary | ICD-10-CM

## 2015-11-28 DIAGNOSIS — E7849 Other hyperlipidemia: Secondary | ICD-10-CM

## 2015-11-28 DIAGNOSIS — E038 Other specified hypothyroidism: Secondary | ICD-10-CM

## 2015-11-28 DIAGNOSIS — I10 Essential (primary) hypertension: Secondary | ICD-10-CM

## 2015-11-30 ENCOUNTER — Telehealth: Payer: Self-pay | Admitting: Pulmonary Disease

## 2015-11-30 ENCOUNTER — Encounter: Payer: Self-pay | Admitting: Family Medicine

## 2015-11-30 NOTE — Addendum Note (Signed)
Addended by: Shary Decamp B on: 11/30/2015 08:02 AM   Modules accepted: Orders

## 2015-11-30 NOTE — Telephone Encounter (Signed)
Called spoke with pt. She is requesting her sputum sample results. I explained to her that we have not received any recs from [facility] and that I would send a message to [facility] for results and recs. She voiced understanding and had no further questions.    JN please advise

## 2015-12-01 NOTE — Telephone Encounter (Signed)
Spoke with pt and advised of Sputum results.  Pt verbalized understanding.  Pt wants to know if the Mycobacterium could affect other parts of the body as well.  She has been having some tooth sensitivity and was curious about this. Please advise

## 2015-12-01 NOTE — Telephone Encounter (Signed)
Still nothing has grown. We will contact her if it does. She should wait a couple of weeks before checking back unless she has new or worsening symptoms.

## 2015-12-02 ENCOUNTER — Encounter: Payer: Self-pay | Admitting: Family Medicine

## 2015-12-06 IMAGING — CT CT PARANASAL SINUSES LIMITED
1 of 5 series · 11 of 30 positions shown, 14 images · non-contrast
Comparison: None.

CLINICAL DATA: Chronic sinus like symptoms.

EXAM:
CT PARANASAL SINUS LIMITED WITHOUT CONTRAST
TECHNIQUE: Non-contiguous multidetector CT images of the paranasal sinuses were
obtained in a single plane without contrast.

[Series 7: ltd sinus 3.0 h30s · axial · 0.35mm/px · z∈[-58,+34]mm · 11 of 18 slices shown, 14 images]
[im 2/18  brain]
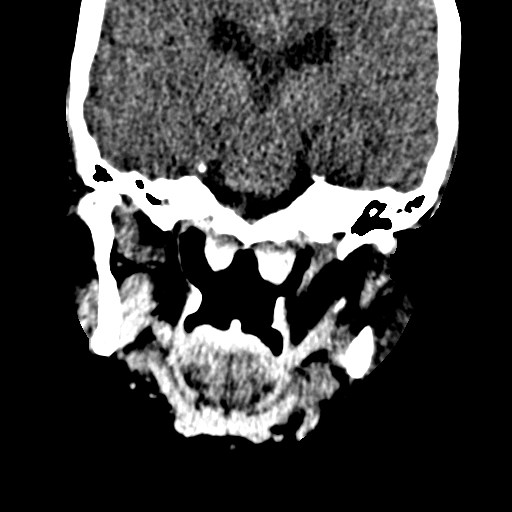
[im 2/18  bone]
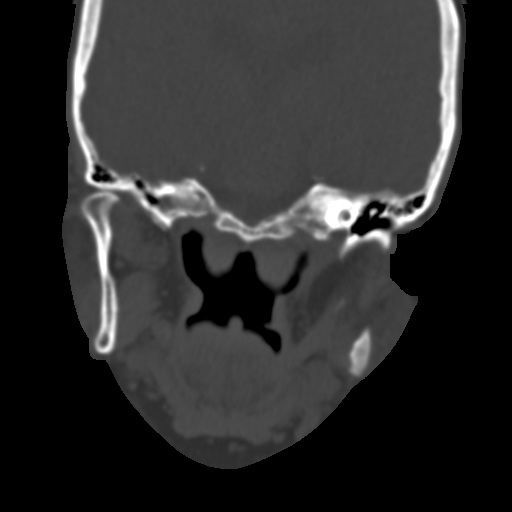
[im 3/18  bone]
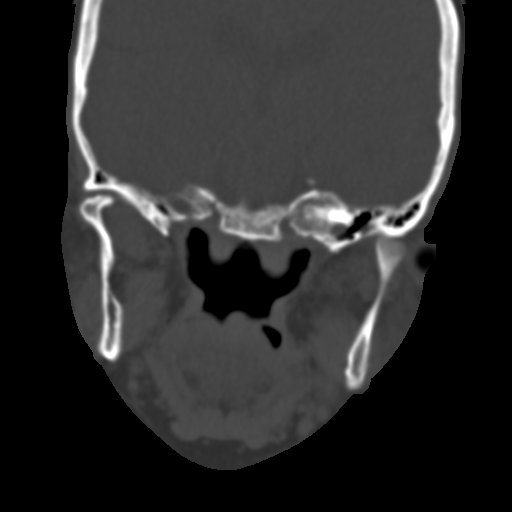
[im 5/18  bone]
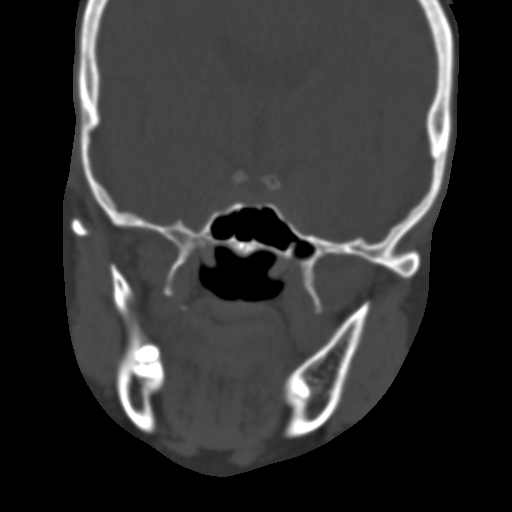
[im 6/18  bone]
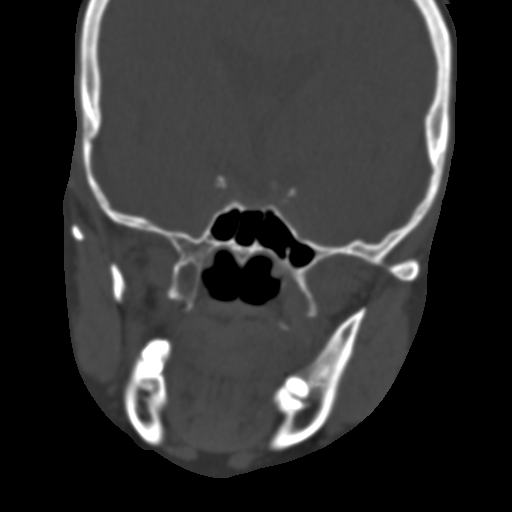
[im 8/18  brain]
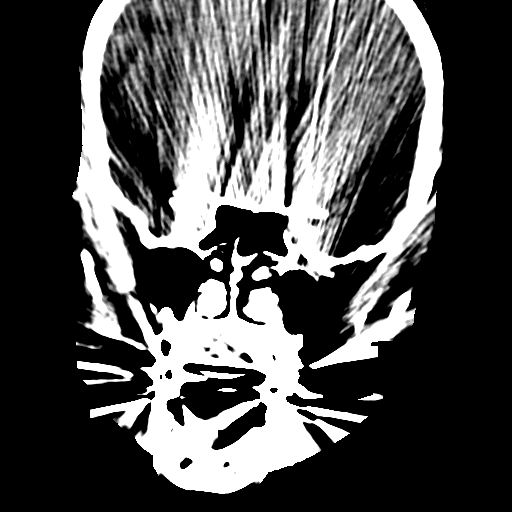
[im 8/18  bone]
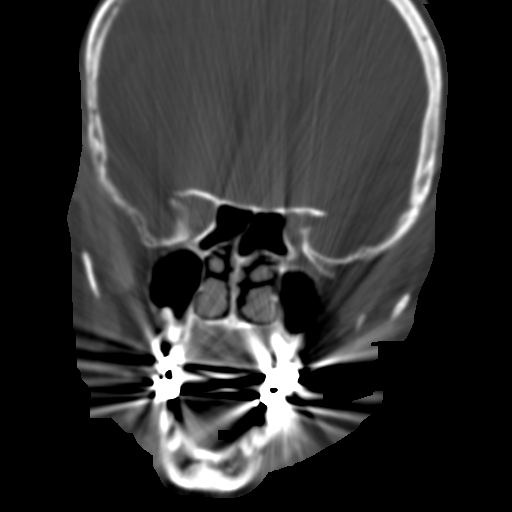
[im 9/18  bone]
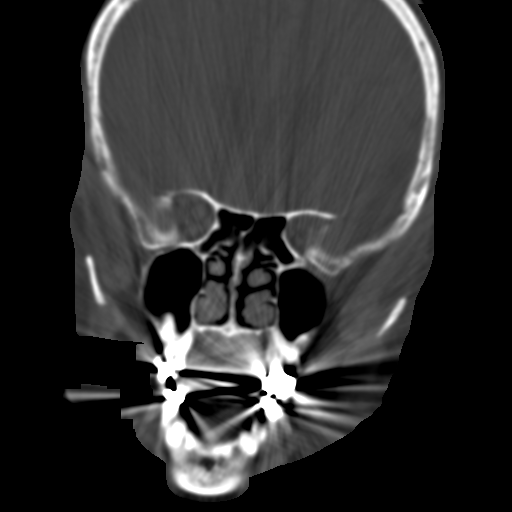
[im 10/18  bone]
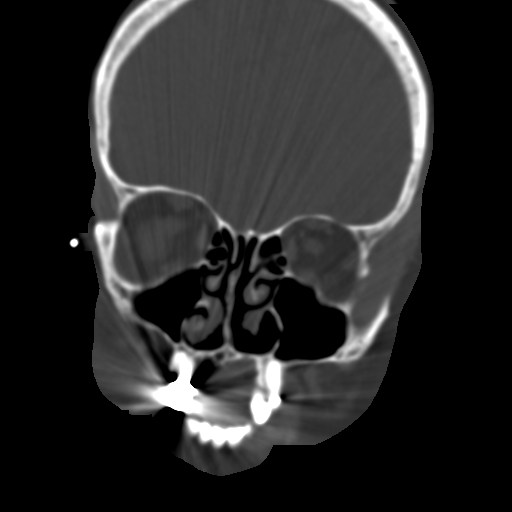
[im 12/18  bone]
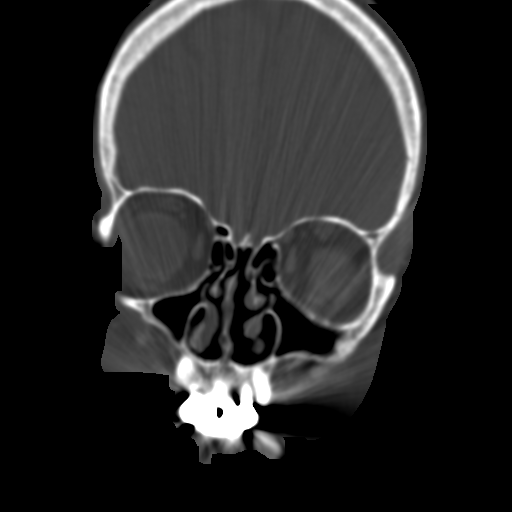
[im 13/18  brain]
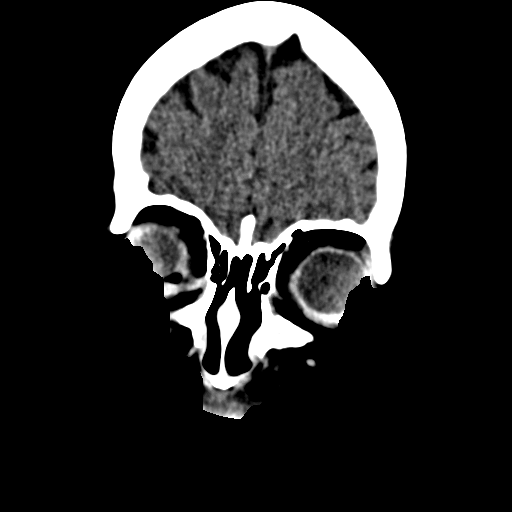
[im 13/18  bone]
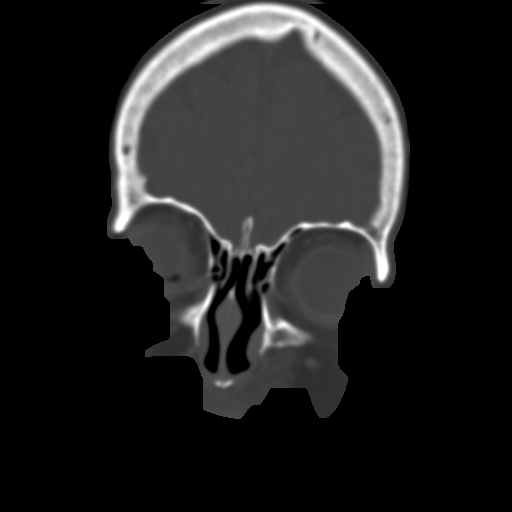
[im 15/18  bone]
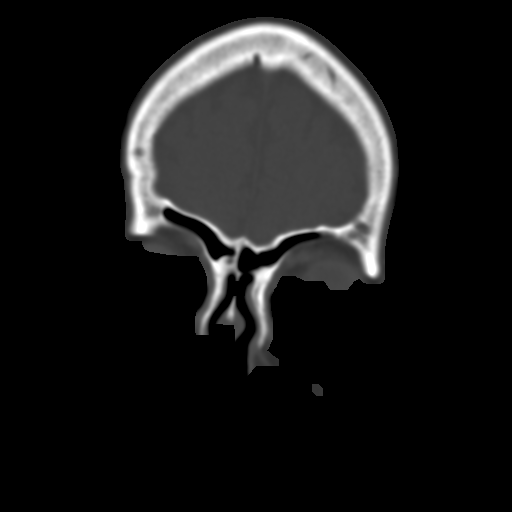
[im 16/18  bone]
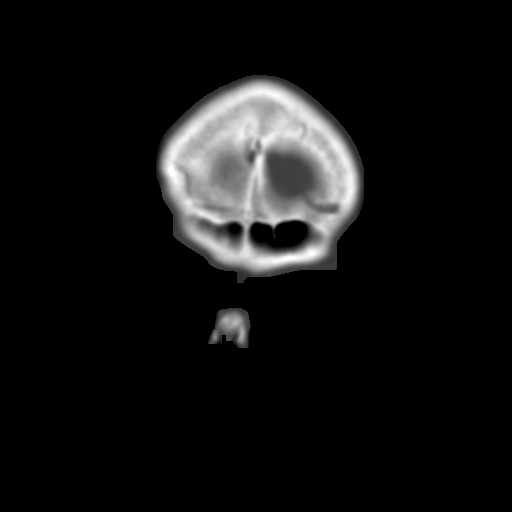

[11 of 30 positions shown; findings below may reference images not displayed]

FINDINGS: Both divisions of the sphenoid sinus are clear.

Frontal sinuses are clear.

Maxillary sinuses are clear

No ethmoid sinus fluid.

Mild nasal septal deviation left-to-right. No visible concha
bullosa. Grossly negative intracranial compartment.
IMPRESSION: Negative for acute or significant chronic sinus disease.

## 2015-12-06 NOTE — Telephone Encounter (Signed)
Spoke with pt. She is wanting results from her sputum sample and would also like her other question answered from 12/01/15 (see documentation below).

## 2015-12-06 NOTE — Telephone Encounter (Signed)
Have we heard anything else on her sputum test [phone]

## 2015-12-07 ENCOUNTER — Other Ambulatory Visit: Payer: Medicare Other

## 2015-12-07 ENCOUNTER — Encounter: Payer: Self-pay | Admitting: Allergy and Immunology

## 2015-12-07 ENCOUNTER — Ambulatory Visit (INDEPENDENT_AMBULATORY_CARE_PROVIDER_SITE_OTHER): Payer: Medicare Other | Admitting: Allergy and Immunology

## 2015-12-07 VITALS — BP 130/70 | HR 72 | Resp 18

## 2015-12-07 DIAGNOSIS — Z79899 Other long term (current) drug therapy: Secondary | ICD-10-CM

## 2015-12-07 DIAGNOSIS — G500 Trigeminal neuralgia: Secondary | ICD-10-CM | POA: Diagnosis not present

## 2015-12-07 DIAGNOSIS — K219 Gastro-esophageal reflux disease without esophagitis: Secondary | ICD-10-CM

## 2015-12-07 DIAGNOSIS — J4550 Severe persistent asthma, uncomplicated: Secondary | ICD-10-CM | POA: Diagnosis not present

## 2015-12-07 DIAGNOSIS — E038 Other specified hypothyroidism: Secondary | ICD-10-CM

## 2015-12-07 DIAGNOSIS — E7849 Other hyperlipidemia: Secondary | ICD-10-CM

## 2015-12-07 DIAGNOSIS — I10 Essential (primary) hypertension: Secondary | ICD-10-CM

## 2015-12-07 DIAGNOSIS — R635 Abnormal weight gain: Secondary | ICD-10-CM | POA: Diagnosis not present

## 2015-12-07 DIAGNOSIS — J3089 Other allergic rhinitis: Secondary | ICD-10-CM | POA: Diagnosis not present

## 2015-12-07 DIAGNOSIS — Z1159 Encounter for screening for other viral diseases: Secondary | ICD-10-CM

## 2015-12-07 LAB — CBC WITH DIFFERENTIAL/PLATELET
Basophils Absolute: 65 cells/uL (ref 0–200)
Basophils Relative: 1 %
Eosinophils Absolute: 455 cells/uL (ref 15–500)
Eosinophils Relative: 7 %
HCT: 36 % (ref 35.0–45.0)
Hemoglobin: 11.9 g/dL — ABNORMAL LOW (ref 12.0–15.0)
Lymphocytes Relative: 37 %
Lymphs Abs: 2405 cells/uL (ref 850–3900)
MCH: 32.6 pg (ref 27.0–33.0)
MCHC: 33.1 g/dL (ref 32.0–36.0)
MCV: 98.6 fL (ref 80.0–100.0)
MPV: 10.3 fL (ref 7.5–12.5)
Monocytes Absolute: 715 cells/uL (ref 200–950)
Monocytes Relative: 11 %
Neutro Abs: 2860 cells/uL (ref 1500–7800)
Neutrophils Relative %: 44 %
Platelets: 228 10*3/uL (ref 140–400)
RBC: 3.65 MIL/uL — ABNORMAL LOW (ref 3.80–5.10)
RDW: 14 % (ref 11.0–15.0)
WBC: 6.5 10*3/uL (ref 3.8–10.8)

## 2015-12-07 LAB — COMPREHENSIVE METABOLIC PANEL
ALT: 36 U/L — ABNORMAL HIGH (ref 6–29)
AST: 36 U/L — ABNORMAL HIGH (ref 10–35)
Albumin: 3.9 g/dL (ref 3.6–5.1)
Alkaline Phosphatase: 39 U/L (ref 33–130)
BUN: 21 mg/dL (ref 7–25)
CO2: 28 mmol/L (ref 20–31)
Calcium: 9.2 mg/dL (ref 8.6–10.4)
Chloride: 106 mmol/L (ref 98–110)
Creat: 1.22 mg/dL — ABNORMAL HIGH (ref 0.50–0.99)
Glucose, Bld: 88 mg/dL (ref 70–99)
Potassium: 4.2 mmol/L (ref 3.5–5.3)
Sodium: 140 mmol/L (ref 135–146)
Total Bilirubin: 0.6 mg/dL (ref 0.2–1.2)
Total Protein: 6.2 g/dL (ref 6.1–8.1)

## 2015-12-07 LAB — LIPID PANEL
Cholesterol: 243 mg/dL — ABNORMAL HIGH (ref 125–200)
HDL: 78 mg/dL (ref >=46)
LDL Cholesterol: 127 mg/dL (ref <130)
Total CHOL/HDL Ratio: 3.1 Ratio (ref <=5.0)
Triglycerides: 188 mg/dL — ABNORMAL HIGH (ref <150)
VLDL: 38 mg/dL — ABNORMAL HIGH (ref <30)

## 2015-12-07 LAB — TSH: TSH: 2.83 mIU/L

## 2015-12-07 LAB — HEPATITIS C ANTIBODY: HCV Ab: NEGATIVE

## 2015-12-07 NOTE — Telephone Encounter (Signed)
Pt called back about results...pt [phone].Ebony Scott

## 2015-12-07 NOTE — Patient Instructions (Addendum)
  1. 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 Continue Periactin 4 mg tablet  2 tablets at bedtime  2. 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 Continue Dexilant 60 mg in the morning  B. Continue ranitidine 300 mg in the evening  4. Continue to treat inflammation:   A. Continue Dulera 200 2 inhalations twice a day  B. Continue Nasonex one spray each nostril 1-2 times a day   C. Continue montelukast 10 mg daily  5. Continue nystatin swish and spit twice a day  6. Continue Xolair and EpiPen as prescribed by Dr. Ashok Cordia  7. Use bronchodilator and Astelin and Zyrtec if needed  8. Return to clinic in 12 weeks or earlier if problem  9. Obtain fall flu vaccine  9. Use bronchodilator and Astelin and Zyrtec if needed  10. Return to clinic in 12 weeks or earlier if problem  11. Obtain fall flu vaccine

## 2015-12-07 NOTE — Telephone Encounter (Signed)
Spoke with pt and made her aware of JN recommendations.  Pt states call next week to f/u. Nothing further needed.

## 2015-12-07 NOTE — Progress Notes (Signed)
Follow-up Note  Referring Provider: [name], MD Primary Provider: Odette Fraction, MD Date of Office Visit: 12/07/2015  Subjective:   Ebony Scott (DOB: [birth year]) is a 65 y.o. 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 Ashok Cordia treated with Xolair. I've not seen her in this clinic since July 2017.  Ebony Scott is undergoing evaluation with Dr. 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 Apparently she had a positive Mycobacterium sputum smear on one occasion and then 2 negatives all with no growth to date. She does have a fair amount of shortness of breath especially dyspnea on exertion. She's gained 15 pounds of weight because she does not exercise. She states that she cannot exercise because she gets "exhausted". Certainly there is a period of shortness of breath when she exercises but she can't really describe a recovery time and when she does exercises she just gets exhausted and gives up.  Her nose is doing relatively well. It does not sound as though she has required an antibiotic for an episode of sinusitis. Her facial pain issue is actually under pretty good control while using her cyproheptadine at nighttime. As well, her sleep is going well while using this medication.  Her reflux is under pretty good control at this point in time.  She's been having a lot of bruising and thin skin and this is becoming an upsetting issue for her at this point. She has not had any systemic steroids in several months. She does continue to use [facility] on a consistent basis.    Medication List      albuterol 108 (90 Base) MCG/ACT inhaler Commonly known as:  PROAIR HFA Inhale  1-2 puffs into the lungs every 6 (six) hours as needed for wheezing or shortness of breath.   ALIGN 4 MG Caps Take 1 tablet by mouth daily. Once a day   Alpha-Lipoic Acid 600 MG Caps Take 600 mg by mouth daily. Once a day   ASTEPRO 0.15 % Soln Generic drug:  Azelastine HCl Place 1 spray into both nostrils at bedtime.   BEANO Tabs Take 1 tablet by mouth daily.   cetirizine 10 MG tablet Commonly known as:  ZYRTEC Take 1/2 tablet daily   CITRACAL MAXIMUM 315-250 MG-UNIT Tabs Generic drug:  Calcium Citrate-Vitamin D Take 1 tablet by mouth 2 (two) times daily.   colchicine 0.6 MG tablet Take 1 tablet (0.6 mg total) by mouth daily.   cyproheptadine 4 MG tablet Commonly known as:  PERIACTIN Take two tablets every evening   denosumab 60 MG/ML Soln injection Commonly known as:  PROLIA Inject 60 mg into the skin every 6 (six) months. Administer in upper arm, thigh, or abdomen   dexlansoprazole 60 MG capsule Commonly known as:  DEXILANT Take 1 capsule (60 mg total) by mouth daily.   EPINEPHrine 0.3 mg/0.3 mL Soaj injection Commonly known as:  EPIPEN 2-PAK Inject 0.3 mLs (0.3 mg total) into the muscle once.   FLUTTER Devi Use as directed   gabapentin 600 MG tablet Commonly known as:  NEURONTIN Take 1 tablet (600 mg total) by mouth daily.   GAS-X EXTRA STRENGTH 125 MG Caps Generic drug:  Simethicone Take 2 each by mouth daily as  needed. For gas relief   hydrocortisone 2.5 % rectal cream Commonly known as:  PROCTOZONE-HC Apply as needed as directed   lactase 3000 units tablet Commonly known as:  LACTAID Take 3 tablets by mouth as needed. 3-4 tabs with dairy liquids and solids   lipase/protease/amylase 12000 units Cpep capsule Commonly known as:  CREON Take 1 capsule (12,000 Units total) by mouth 3 (three) times daily. CREON 24000-76000-120000 U Takes with meals   mirtazapine 30 MG disintegrating tablet Commonly known as:  REMERON SOLTAB Take 1 tablet (30 mg total)  by mouth at bedtime.   mometasone 50 MCG/ACT nasal spray Commonly known as:  NASONEX Place 2 sprays into the nose 2 (two) times daily.   mometasone-formoterol 200-5 MCG/ACT Aero Commonly known as:  DULERA Inhale 2 puffs into the lungs 2 (two) times daily.   montelukast 10 MG tablet Commonly known as:  SINGULAIR TAKE 1 TABLET BY MOUTH ONCE DAILY   MUCINEX PO Take by mouth 2 (two) times daily.   mupirocin ointment 2 % Commonly known as:  BACTROBAN Apply inside nose two to three times a day when needed.   neomycin-bacitracin-polymyxin ointment Commonly known as:  NEOSPORIN Apply 1 application topically every 12 (twelve) hours. Use on/in nose as directed as needed   nystatin 100000 UNIT/ML suspension Commonly known as:  MYCOSTATIN Use as directed 5 mLs (500,000 Units total) in the mouth or throat 4 (four) times daily. Reported on 03/08/2015   nystatin-triamcinolone ointment Commonly known as:  MYCOLOG Apply topically 2 (two) times daily. Reported on 03/08/2015   OTOVEL OT Place in ear(s). As directed by Dr. Thornell Mule   pravastatin 20 MG tablet Commonly known as:  PRAVACHOL Take 1 tablet (20 mg total) by mouth daily.   propylthiouracil 50 MG tablet Commonly known as:  PTU take 1/2 tablet (25 mg) twice daily   ranitidine 300 MG tablet Commonly known as:  ZANTAC Take 1 tablet (300 mg total) by mouth daily.   SOOTHE OP Apply to eye as needed.   TYLENOL ARTHRITIS PAIN 650 MG CR tablet Generic drug:  acetaminophen Take 650 mg by mouth 2 (two) times daily.   VINATE M 27-1 MG Tabs Take 1 tablet by mouth daily.   Vitamin D 2000 units Caps 1/2 at bedtime   XOLAIR 150 MG injection Generic drug:  omalizumab Inject 150 mg into the skin every 28 (twenty-eight) days. 150 SQ every 4 weeks       Past Medical History:  Diagnosis Date  . Allergic rhinitis   . Allergy    SEASONAL  . Anemia   . Anxiety   . Arthritis    "mostly the hands" (02/20/2012)  . Asthma   . Breast  cancer ([place]) 1998   in remission  . Cataract    REMOVED  . Chronic lower back pain   . Chronic neck pain   . Complication of anesthesia    "had hard time waking up from it several times" (02/20/2012)  . Depression    "some; don't take anything for it" (02/20/2012)  . Diverticulosis   . DVT (deep venous thrombosis) ([place])   . Exertional dyspnea   . Fibromyalgia 11/2011  . GERD (gastroesophageal reflux disease)   . Graves disease   . Headache(784.0)    "related to allergies; more at different times during the year" (02/20/2012)  . Hemorrhoids   . Hiatal hernia   . Hypercholesteremia   . Hypothyroidism   . IBS (irritable bowel syndrome)   . Moderate  persistent asthma    -FeV1 72% 2011, -IgE 102 2011, CT sinus Neg 2011  . Osteoporosis    on reclast yearly  . Pneumonia 04/2011; ~ 11/2011   "double; single" (02/20/2012)    Past Surgical History:  Procedure Laterality Date  . ANTERIOR AND POSTERIOR REPAIR  1990's  . APPENDECTOMY    . BREAST LUMPECTOMY  1998   left  . CARPOMETACARPEL ([place]) FUSION OF THUMB WITH AUTOGRAFT FROM RADIUS  ~ 2009   "both thumbs" (02/20/2012)  . CATARACT EXTRACTION W/ INTRAOCULAR LENS  IMPLANT, BILATERAL  2012  . CERVICAL DISCECTOMY  10/2001   C5-C6  . CERVICAL FUSION  2003   C3-C4  . CHOLECYSTECTOMY    . COLONOSCOPY    . DEBRIDEMENT TENNIS ELBOW  ?1970's   right  . ESOPHAGOGASTRODUODENOSCOPY    . HYSTERECTOMY    . KNEE ARTHROPLASTY  ?1990's   "?right; w/cartilage repair" (02/20/2012)  . NASAL SEPTUM SURGERY  1980's  . POSTERIOR CERVICAL FUSION/FORAMINOTOMY  2004   "failed initial fusion; rewired  anterior neck" (02/20/2012)  . TONSILLECTOMY  ~ 1953  . VESICOVAGINAL FISTULA CLOSURE W/ TAH  1988    Allergies  Allergen Reactions  . Dust Mite Extract Shortness Of Breath and Other (See Comments)    "sneezing" (02/20/2012)  . Molds & Smuts Shortness Of Breath  . Morphine Sulfate Itching  . Other Shortness Of Breath and Other (See Comments)    Grass and  weeds "sneezing; filled sinuses" (02/20/2012)  . Penicillins Rash and Other (See Comments)    "welts" (02/20/2012)  . Reclast [Zoledronic Acid]     Put in hospital  . Rofecoxib Swelling    Celebrex REACTION: feet swelling  . Shrimp Flavor Anaphylaxis    ALL SHELLFISH  . Tetracycline Hcl Nausea And Vomiting  . Dilaudid [Hydromorphone Hcl] Itching  . Hydrocodone-Acetaminophen Nausea And Vomiting  . Levofloxacin Other (See Comments)    REACTION: GI upset  . Oxycodone Hcl Nausea And Vomiting  . Paroxetine Nausea And Vomiting    Paxil   . Diltiazem Swelling  . Tree Extract Other (See Comments)    "tested and told I was allergic to it; never experienced a reaction to it" (02/20/2012)    Review of systems negative except as noted in HPI / PMHx or noted below:  Review of Systems  Constitutional: Negative.   HENT: Negative.   Eyes: Negative.   Respiratory: Negative.   Cardiovascular: Negative.   Gastrointestinal: Negative.   Genitourinary: Negative.   Musculoskeletal: Negative.   Skin: Negative.   Neurological: Negative.   Endo/Heme/Allergies: Negative.   Psychiatric/Behavioral: Negative.      Objective:   Vitals:   12/07/15 1042  BP: 130/70  Pulse: 72  Resp: 18          Physical Exam  Constitutional: She is well-developed, well-nourished, and in no distress.  HENT:  Head: Normocephalic.  Right Ear: Tympanic membrane, external ear and ear canal normal.  Left Ear: Tympanic membrane, external ear and ear canal normal.  Nose: Nose normal. No mucosal edema or rhinorrhea.  Mouth/Throat: Uvula is midline, oropharynx is clear and moist and mucous membranes are normal. No oropharyngeal exudate.  Eyes: Conjunctivae are normal.  Neck: Trachea normal. No tracheal tenderness present. No tracheal deviation present. No thyromegaly present.  Cardiovascular: Normal rate, regular rhythm, S1 normal, S2 normal and normal heart sounds.   No murmur heard. Pulmonary/Chest: Breath sounds  normal. No stridor. No respiratory distress. She has no wheezes. She has no  rales.  Musculoskeletal: She exhibits no edema.  Lymphadenopathy:       Head (right side): No tonsillar adenopathy present.       Head (left side): No tonsillar adenopathy present.    She has no cervical adenopathy.  Neurological: She is alert. Gait normal.  Skin: No rash noted. She is not diaphoretic. No erythema. Nails show no clubbing.  Psychiatric: Mood and affect normal.    Diagnostics:    Spirometry was performed and demonstrated an FEV1 of 1.49 at 71 % of predicted.  Assessment and Plan:   1. Severe persistent asthma, uncomplicated   2. Other allergic rhinitis   3. Facial pain syndrome   4. LPRD (laryngopharyngeal reflux disease)   5. Weight gain     1. 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 Continue Periactin 4 mg tablet  2 tablets at bedtime  2. 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 Continue Dexilant 60 mg in the morning  B. Continue ranitidine 300 mg in the evening  4. Continue to treat inflammation:   A. Continue Dulera 200 2 inhalations twice a day  B. Continue Nasonex one spray each nostril 1-2 times a day   C. Continue montelukast 10 mg daily  5. Continue nystatin swish and spit twice a day  6. Continue Xolair and EpiPen as prescribed by Dr. Ashok Cordia  7. Use bronchodilator and Astelin and Zyrtec if needed  8. Return to clinic in 12 weeks or earlier if problem  9. Obtain fall flu vaccine  Matina appears to be stable from the standpoint of her facial pain syndrome and her reflux disease although certainly some of her other eosinophil driven respiratory tract inflammation and possible infectious disease is still an active issue being addressed by Dr. Ashok Cordia. She has gained 15 pounds of weight and unfortunately this may be secondary to the use of her Periactin and I did have a talk with her today about how this drug can act as a appetite stimulant. In  addition, she's been having bruising and some of that may be secondary to the use of Dulera and I once again informed her about the potential side effect of using this medication. Given that she's had a good response to both medications I think she needs to remain on both of these medications at this point. Of course, receiving systemic steroids 3 months ago also may have contributed to some of her skin fragility and I did inform her about that possibility as well. I will see her back in this clinic in 12 weeks or earlier if there is a problem.  Allena Katz, MD [place]

## 2015-12-12 LAB — FUNGUS CULTURE W SMEAR

## 2015-12-14 ENCOUNTER — Ambulatory Visit (INDEPENDENT_AMBULATORY_CARE_PROVIDER_SITE_OTHER): Payer: Medicare Other

## 2015-12-14 ENCOUNTER — Telehealth: Payer: Self-pay | Admitting: Pulmonary Disease

## 2015-12-14 ENCOUNTER — Encounter: Payer: Self-pay | Admitting: Family Medicine

## 2015-12-14 DIAGNOSIS — J4520 Mild intermittent asthma, uncomplicated: Secondary | ICD-10-CM | POA: Diagnosis not present

## 2015-12-14 MED ORDER — OMALIZUMAB 150 MG ~~LOC~~ SOLR
150.0000 mg | SUBCUTANEOUS | Status: DC
  Administered 2015-12-14: 150 mg via SUBCUTANEOUS

## 2015-12-14 NOTE — Telephone Encounter (Signed)
  Javier Glazier, MD  10:56 AM  Note    Still nothing has grown. We will contact her if it does. She should wait a couple of weeks before checking back unless she has new or worsening symptoms.     Pt aware that we will call her as soon as we hear back about her results. Per the Micro results, these are still in the preliminary stage and we wont know anything for another 1-3 weeks. Pt expressed understanding. Nothing further needed.

## 2015-12-16 ENCOUNTER — Telehealth: Payer: Self-pay | Admitting: Pulmonary Disease

## 2015-12-16 NOTE — Telephone Encounter (Signed)
#   vials:1 Ordered date:12/16/15 Shipping Date:12/19/15

## 2015-12-19 NOTE — Telephone Encounter (Signed)
#   Vials:1 Arrival Date:12/19/15 Lot [id number] Exp Date:4/21

## 2015-12-22 ENCOUNTER — Encounter: Payer: Self-pay | Admitting: Family Medicine

## 2015-12-22 ENCOUNTER — Ambulatory Visit (INDEPENDENT_AMBULATORY_CARE_PROVIDER_SITE_OTHER): Payer: Medicare Other | Admitting: Family Medicine

## 2015-12-22 VITALS — BP 112/70 | HR 64 | Temp 98.6°F | Resp 18 | Ht 62.0 in | Wt 128.0 lb

## 2015-12-22 DIAGNOSIS — E0590 Thyrotoxicosis, unspecified without thyrotoxic crisis or storm: Secondary | ICD-10-CM

## 2015-12-22 DIAGNOSIS — I10 Essential (primary) hypertension: Secondary | ICD-10-CM | POA: Diagnosis not present

## 2015-12-22 DIAGNOSIS — E7800 Pure hypercholesterolemia, unspecified: Secondary | ICD-10-CM

## 2015-12-22 DIAGNOSIS — M2550 Pain in unspecified joint: Secondary | ICD-10-CM

## 2015-12-22 MED ORDER — HYDROCORTISONE 2.5 % RE CREA: TOPICAL_CREAM | RECTAL | 11 refills | Status: DC

## 2015-12-22 MED ORDER — PREDNISONE 20 MG PO TABS: ORAL_TABLET | ORAL | 0 refills | Status: DC

## 2015-12-22 NOTE — Progress Notes (Signed)
Subjective:    Patient ID: Ebony Scott, female    DOB: 18-Jul-1950, 65 y.o.   MRN: [id number]  HPI 05/26/15 She presents with one-week of severe left knee pain. The pain began shortly after she received epidural steroid injections at her pain specialist. She waited a week but the pain has intensified in her left knee. Now the left knee is swollen with an effusion and also very warm to the touch. She can barely walk on it. It hurts to put weight on it. She denies any injury. Patient had similar symptoms in the past. She was hospitalized in 2014 and joint fluid analysis revealed calcium pyrophosphate crystals consistent with pseudogout. The reaction she was experiencing at that time is very much similar to the reaction she is experiencing now. There is no evidence of septic arthritis in that the patient has not had any instrumentation done to the knee. She has no symptoms of a systemic infection. There is no erythema in the knee.  At that time, my plan was: Given the level of the patient's pain and her past medical history, I believe this is acute pseudogout. Using sterile technique, I aspirated the left knee through an 18-gauge needle and was able to aspirate 10 mL of yellow serous fluid. This fluid will be sent for cell count, crystal analysis, and culture. Through the same needle then, I injected 2 mL of 0.1% lidocaine, 2 mL of Marcaine, and 2 mL of 40 mg per mL Kenalog. I explained to the patient that it would be dangerous to go ahead and inject her knee until we have ruled out infection. However the patient is willing to do this because it is hard for her to come back to the clinic as she does not have transportation and I believe this is truly pseudogout. Patient tolerated the procedure well with no complication. Return fasting for a CMP and fasting lipid panel to monitor her cholesterol and her liver function test.  06/03/15 Culture and graim stain were negative for septic arthritis.    Patient states  that her left knee became much better after the cortisone shot.  however 2 days ago, the patient developed diffuse pain all over her body particularly in her neck and shoulders and lower back and tailbone. She is also now hurting in her knees and her ankles. She has a low-grade fever to 100.1. She denies any recent tick bites. She denies any rashes. She does have normal range of motion in her neck but it is sore and painful. She has had a similar systemic reaction to pseudogout in the past. Please see the hospitalization from 2014 area and however I am also concerned about systemic illness such as possible tickborne illness as well as complications from epidural steroid injections. Clinically she does not appear to have meningitis and she does not appear toxic. I believe the likelihood of meningitis secondary to an epidural steroid injection is highly unlikely given her presentation. Suspect pseudogout particularly given the reaction the patient had to the shot in her knee. Polymyalgia rheumatica is also a possibility.  At that time, my plan was: Ultimately we are going to have to make a decision based on incomplete information. The question is whether this is pseudogout similar to what she experienced in 2014 which would benefit from systemic steroids, polymyalgia rheumatica which would benefit from systemic steroids, or some type of systemic infection such as a tickborne illness versus bacteremia. I will check blood cultures today. I will check  Lyme titers along with [facility] spotted fever titers. I asked pointed the patient that this will not be back in any time frame to help [place] make a decision. I will obtain a stat CBC. If her white blood cell count is elevated, I would recommend ER evaluation and imaging of the involved joints to evaluate for possible possible signs of osteomyelitis and bacteremia. If her white blood cell count is normal, I would suggest trying systemic steroids over the weekend with a  low threshold to go to the hospital should her symptoms worsen for presumptive treatment of pseudogout  White blood cell count is elevated at 16. We discussed the differential diagnosis including bacteremia with developing osteomyelitis versus tickborne illness versus pseudogout. Clinically the patient does not appear toxic or sick. I truly believe them a hard this is pseudogout. Therefore I started the patient on a prednisone taper pack in addition to doxycycline 100 mg by mouth twice a day until we receive the tickborne titers back. Patient is aware of the risk of doing this including worsening of a systemic infection. She will go the hospital immediately over the weekend if she worsens. However she is in agreement to try prednisone and she also agrees this feels like her previous systemic pseudogout.  06/06/15 Thankfully, the patient is almost 100% better. Blood cultures are negative. Tickborne titers are still pending.  At that time, my plan was: Patient appears to be having an acute flare of pseudogout which is now essentially resolved. Complete prednisone. Begin colchicine 0.6 mg by mouth daily for the next 2 weeks and then discontinue colchicine if pain does not return. Continue doxycycline until tick borne titers have returned however I do not believe this is likely the cause  07/08/15 Symptoms completely went away after finishing prednisone. However recently she started having pain again in both feet and in both knees. She started taking colchicine immediately 1 pill a day and the pain now is completely better. This occurred 2 days ago. But she believes she was having another episode of pseudogout beginning to start.  At that time, my plan was: I recommended colchicine 0.6 mg by mouth daily as a prophylaxis. During outbreaks she is to take 1.2 mg immediately and then another 0.6 mg tablet within 2 hours if the pain persists. She is then to take 0.6 mg by mouth twice a day every day until the pain  completely resolves and then resume 0.6 mg by mouth daily as a prophylaxis. She continues to have outbreaks even on this strategy, I recommend a rheumatology consultation. She also has a yeast infection for which she is requesting Diflucan and Lotrisone cream.  12/22/15 Over the last 3 weeks, the patient has developed a nagging pain in her lower back, lateral left hip, and posterior left knee. The joints ache and throb. They will wake her up from sleep. The pain is more than simple arthritis. She denies any fevers and chills. There is no edema in the left leg. There is no erythema. She has pain with range of motion in the left hip as well as range of motion in the left knee. Pain is similar to her previous exacerbations of pseudogout although the symptoms could also possibly be sciatica. She is also here today to follow-up her chronic medical conditions including hyperlipidemia and hyperthyroidism. Her blood pressure today is well controlled. Her most recent lab work as listed below: Appointment on 12/07/2015  Component Date Value Ref Range Status  . WBC 12/07/2015 6.5  3.8 - 10.8 K/uL Final  . RBC 12/07/2015 3.65* 3.80 - 5.10 MIL/uL Final  . Hemoglobin 12/07/2015 11.9* 12.0 - 15.0 g/dL Final  . HCT 12/07/2015 36.0  35.0 - 45.0 % Final  . MCV 12/07/2015 98.6  80.0 - 100.0 fL Final  . MCH 12/07/2015 32.6  27.0 - 33.0 pg Final  . MCHC 12/07/2015 33.1  32.0 - 36.0 g/dL Final  . RDW 12/07/2015 14.0  11.0 - 15.0 % Final  . Platelets 12/07/2015 228  140 - 400 K/uL Final  . MPV 12/07/2015 10.3  7.5 - 12.5 fL Final  . Neutro Abs 12/07/2015 2860  1,500 - 7,800 cells/uL Final  . Lymphs Abs 12/07/2015 2405  850 - 3,900 cells/uL Final  . Monocytes Absolute 12/07/2015 715  200 - 950 cells/uL Final  . Eosinophils Absolute 12/07/2015 455  15 - 500 cells/uL Final  . Basophils Absolute 12/07/2015 65  0 - 200 cells/uL Final  . Neutrophils Relative % 12/07/2015 44  % Final  . Lymphocytes Relative 12/07/2015 37  %  Final  . Monocytes Relative 12/07/2015 11  % Final  . Eosinophils Relative 12/07/2015 7  % Final  . Basophils Relative 12/07/2015 1  % Final  . Smear Review 12/07/2015 Criteria for review not met   Final  . Sodium 12/07/2015 140  135 - 146 mmol/L Final  . Potassium 12/07/2015 4.2  3.5 - 5.3 mmol/L Final  . Chloride 12/07/2015 106  98 - 110 mmol/L Final  . CO2 12/07/2015 28  20 - 31 mmol/L Final  . Glucose, Bld 12/07/2015 88  70 - 99 mg/dL Final  . BUN 12/07/2015 21  7 - 25 mg/dL Final  . Creat 12/07/2015 1.22* 0.50 - 0.99 mg/dL Final   Comment:   For patients > or = 67 years of age: The upper reference limit for Creatinine is approximately 13% higher for people identified as African-American.     . Total Bilirubin 12/07/2015 0.6  0.2 - 1.2 mg/dL Final  . Alkaline Phosphatase 12/07/2015 39  33 - 130 U/L Final  . AST 12/07/2015 36* 10 - 35 U/L Final  . ALT 12/07/2015 36* 6 - 29 U/L Final  . Total Protein 12/07/2015 6.2  6.1 - 8.1 g/dL Final  . Albumin 12/07/2015 3.9  3.6 - 5.1 g/dL Final  . Calcium 12/07/2015 9.2  8.6 - 10.4 mg/dL Final  . Cholesterol 12/07/2015 243* 125 - 200 mg/dL Final  . Triglycerides 12/07/2015 188* <150 mg/dL Final  . HDL 12/07/2015 78  >=46 mg/dL Final  . Total CHOL/HDL Ratio 12/07/2015 3.1  <=5.0 Ratio Final  . VLDL 12/07/2015 38* <30 mg/dL Final  . LDL Cholesterol 12/07/2015 127  <130 mg/dL Final   Comment:   Total Cholesterol/HDL Ratio:CHD Risk                        Coronary Heart Disease Risk Table                                        Men       Women          1/2 Average Risk              3.4        3.3              Average Risk  5.0        4.4           2X Average Risk              9.6        7.1           3X Average Risk             23.4       11.0 Use the calculated Patient Ratio above and the CHD Risk table  to determine the patient's CHD Risk.   Marland Kitchen TSH 12/07/2015 2.83  mIU/L Final   Comment:   Reference Range   > or = 20 Years   0.40-4.50   Pregnancy Range First trimester  0.26-2.66 Second trimester 0.55-2.73 Third trimester  0.43-2.91     . HCV Ab 12/07/2015 NEGATIVE  NEGATIVE Final  Orders Only on 11/22/2015  Component Date Value Ref Range Status  . Gram Stain 11/25/2015 Few   Final  . Gram Stain 11/25/2015 WBC present-predominately PMN   Final  . Gram Stain 11/25/2015 Few Squamous Epithelial Cells Present   Final  . Gram Stain 11/25/2015 Moderate Gram Positive Cocci In Pairs In Chains This specimen is acceptable for Sputum Culture   Final  . Organism ID, Bacteria 11/25/2015 Normal Oropharyngeal Flora   Final  . Source: 11/28/2015 SPUTUM   Final  . Acid Fast Bacilli (AFB) Culture 11/28/2015     Final   Comment:   MYCOBACTERIA, CULTURE, WITH FLUOROCHROME SMEAR       MICRO NUMBER:      [id number]   TEST STATUS:       PRELIMINARY   SPECIMEN SOURCE:   SPUTUM   SPECIMEN QUALITY:  ADEQUATE   SMEAR:             No acid fast bacilli seen.   RESULT:            Culture results to follow. Final reports of                      negative cultures can be expected in approximately                      six weeks. Positive cultures are reported                      immediately.   Appointment on 11/22/2015  Component Date Value Ref Range Status  . Source: 11/28/2015 Not Provided   Final  . Fungus (Mycology) Culture 11/28/2015     Preliminary   Comment:   CULTURE, FUNGUS W/SMEAR NOT HAIR, SKIN, BLOOD       MICRO NUMBER:      [id number]   TEST STATUS:       PRELIMINARY   SPECIMEN SOURCE:   BRONCHIAL WASHING   SPECIMEN QUALITY:  ADEQUATE   SMEAR:             No fungal elements seen.   RESULT:            Culture in progress     Past Medical History:  Diagnosis Date  . Allergic rhinitis   . Allergy    SEASONAL  . Anemia   . Anxiety   . Arthritis    "mostly the hands" (02/20/2012)  . Asthma   . Breast cancer (HCC) 1998   in remission  . Cataract    REMOVED  . Chronic lower back  pain   . Chronic neck pain   .  Complication of anesthesia    "had hard time waking up from it several times" (02/20/2012)  . Depression    "some; don't take anything for it" (02/20/2012)  . Diverticulosis   . DVT (deep venous thrombosis) ([place])   . Exertional dyspnea   . Fibromyalgia 11/2011  . GERD (gastroesophageal reflux disease)   . Graves disease   . Headache(784.0)    "related to allergies; more at different times during the year" (02/20/2012)  . Hemorrhoids   . Hiatal hernia   . Hypercholesteremia   . Hypothyroidism   . IBS (irritable bowel syndrome)   . Moderate persistent asthma    -FeV1 72% 2011, -IgE 102 2011, CT sinus Neg 2011  . Osteoporosis    on reclast yearly  . Pneumonia 04/2011; ~ 11/2011   "double; single" (02/20/2012)   Past Surgical History:  Procedure Laterality Date  . ANTERIOR AND POSTERIOR REPAIR  1990's  . APPENDECTOMY    . BREAST LUMPECTOMY  1998   left  . CARPOMETACARPEL ([place]) FUSION OF THUMB WITH AUTOGRAFT FROM RADIUS  ~ 2009   "both thumbs" (02/20/2012)  . CATARACT EXTRACTION W/ INTRAOCULAR LENS  IMPLANT, BILATERAL  2012  . CERVICAL DISCECTOMY  10/2001   C5-C6  . CERVICAL FUSION  2003   C3-C4  . CHOLECYSTECTOMY    . COLONOSCOPY    . DEBRIDEMENT TENNIS ELBOW  ?1970's   right  . ESOPHAGOGASTRODUODENOSCOPY    . HYSTERECTOMY    . KNEE ARTHROPLASTY  ?1990's   "?right; w/cartilage repair" (02/20/2012)  . NASAL SEPTUM SURGERY  1980's  . POSTERIOR CERVICAL FUSION/FORAMINOTOMY  2004   "failed initial fusion; rewired  anterior neck" (02/20/2012)  . TONSILLECTOMY  ~ 1953  . VESICOVAGINAL FISTULA CLOSURE W/ TAH  1988   Current Outpatient Prescriptions on File Prior to Visit  Medication Sig Dispense Refill  . acetaminophen (TYLENOL ARTHRITIS PAIN) 650 MG CR tablet Take 650 mg by mouth 2 (two) times daily.     Marland Kitchen albuterol (PROAIR HFA) 108 (90 BASE) MCG/ACT inhaler Inhale 1-2 puffs into the lungs every 6 (six) hours as needed for wheezing or shortness of breath. 1 Inhaler 3  .  Alpha-D-Galactosidase (BEANO) TABS Take 1 tablet by mouth daily.     . Alpha-Lipoic Acid 600 MG CAPS Take 600 mg by mouth daily. Once a day    . Azelastine HCl (ASTEPRO) 0.15 % SOLN Place 1 spray into both nostrils at bedtime.    . Calcium Citrate-Vitamin D (CITRACAL MAXIMUM) 315-250 MG-UNIT TABS Take 1 tablet by mouth 2 (two) times daily.     . cetirizine (ZYRTEC) 10 MG tablet Take 1/2 tablet daily    . Cholecalciferol (VITAMIN D) 2000 UNITS CAPS 1/2 at bedtime    . Ciprofloxacin-Fluocinolone (OTOVEL OT) Place in ear(s). As directed by Dr. Thornell Mule    . colchicine 0.6 MG tablet Take 1 tablet (0.6 mg total) by mouth daily. 90 tablet 3  . cyproheptadine (PERIACTIN) 4 MG tablet Take two tablets every evening 60 tablet 5  . denosumab (PROLIA) 60 MG/ML SOLN injection Inject 60 mg into the skin every 6 (six) months. Administer in upper arm, thigh, or abdomen    . dexlansoprazole (DEXILANT) 60 MG capsule Take 1 capsule (60 mg total) by mouth daily. 30 capsule 5  . EPINEPHrine (EPIPEN 2-PAK) 0.3 mg/0.3 mL IJ SOAJ injection Inject 0.3 mLs (0.3 mg total) into the muscle once. 1 Device 4  .  gabapentin (NEURONTIN) 600 MG tablet Take 1 tablet (600 mg total) by mouth daily. 90 tablet 3  . GuaiFENesin (MUCINEX PO) Take by mouth 2 (two) times daily.    . hydrocortisone (PROCTOZONE-HC) 2.5 % rectal cream Apply as needed as directed 30 g 11  . lactase (LACTAID) 3000 UNITS tablet Take 3 tablets by mouth as needed. 3-4 tabs with dairy liquids and solids    . lipase/protease/amylase (CREON) 12000 UNITS CPEP capsule Take 1 capsule (12,000 Units total) by mouth 3 (three) times daily. CREON 24000-76000-120000 U Takes with meals 270 capsule 5  . mirtazapine (REMERON SOLTAB) 30 MG disintegrating tablet Take 1 tablet (30 mg total) by mouth at bedtime. 30 tablet 11  . mometasone (NASONEX) 50 MCG/ACT nasal spray Place 2 sprays into the nose 2 (two) times daily. 102 g 3  . mometasone-formoterol (DULERA) 200-5 MCG/ACT AERO Inhale  2 puffs into the lungs 2 (two) times daily. 1 Inhaler 5  . montelukast (SINGULAIR) 10 MG tablet TAKE 1 TABLET BY MOUTH ONCE DAILY 30 tablet 11  . mupirocin ointment (BACTROBAN) 2 % Apply inside nose two to three times a day when needed. 22 g 0  . neomycin-bacitracin-polymyxin (NEOSPORIN) ointment Apply 1 application topically every 12 (twelve) hours. Use on/in nose as directed as needed    . nystatin (MYCOSTATIN) 100000 UNIT/ML suspension Use as directed 5 mLs (500,000 Units total) in the mouth or throat 4 (four) times daily. Reported on 03/08/2015 473 mL 3  . nystatin-triamcinolone ointment (MYCOLOG) Apply topically 2 (two) times daily. Reported on 03/08/2015 30 g 1  . omalizumab (XOLAIR) 150 MG injection Inject 150 mg into the skin every 28 (twenty-eight) days. 150 SQ every 4 weeks    . pravastatin (PRAVACHOL) 20 MG tablet Take 1 tablet (20 mg total) by mouth daily. 90 tablet 1  . Prenatal Vit-Sel-Fe Fum-FA (VINATE M) 27-1 MG TABS Take 1 tablet by mouth daily. 30 each 11  . Probiotic Product (ALIGN) 4 MG CAPS Take 1 tablet by mouth daily. Once a day    . Propylene Glycol-Glycerin (SOOTHE OP) Apply to eye as needed.    . propylthiouracil (PTU) 50 MG tablet take 1/2 tablet (25 mg) twice daily (Patient taking differently: Take 50 mg by mouth daily. take 1/2 tablet (25 mg) twice daily) 30 tablet 5  . ranitidine (ZANTAC) 300 MG tablet Take 1 tablet (300 mg total) by mouth daily. 30 tablet 5  . Respiratory Therapy Supplies (FLUTTER) DEVI Use as directed 1 each 0  . Simethicone (GAS-X EXTRA STRENGTH) 125 MG CAPS Take 2 each by mouth daily as needed. For gas relief    . [DISCONTINUED] methimazole (TAPAZOLE) 5 MG tablet Take 5 mg by mouth 3 (three) times daily.       Current Facility-Administered Medications on File Prior to Visit  Medication Dose Route Frequency Provider Last Rate Last Dose  . omalizumab Arvid Right) injection 150 mg  150 mg Subcutaneous Q14 Days Javier Glazier, MD   150 mg at 09/20/15  1633  . omalizumab Arvid Right) injection 150 mg  150 mg Subcutaneous Q14 Days Javier Glazier, MD   150 mg at 10/19/15 1618  . omalizumab Arvid Right) injection 150 mg  150 mg Subcutaneous Q14 Days Javier Glazier, MD   150 mg at 12/14/15 1217   Allergies  Allergen Reactions  . Dust Mite Extract Shortness Of Breath and Other (See Comments)    "sneezing" (02/20/2012)  . Molds & Smuts Shortness Of Breath  . Morphine Sulfate Itching  .  Other Shortness Of Breath and Other (See Comments)    Grass and weeds "sneezing; filled sinuses" (02/20/2012)  . Penicillins Rash and Other (See Comments)    "welts" (02/20/2012)  . Reclast [Zoledronic Acid]     Put in hospital  . Rofecoxib Swelling    Celebrex REACTION: feet swelling  . Shrimp Flavor Anaphylaxis    ALL SHELLFISH  . Tetracycline Hcl Nausea And Vomiting  . Dilaudid [Hydromorphone Hcl] Itching  . Hydrocodone-Acetaminophen Nausea And Vomiting  . Levofloxacin Other (See Comments)    REACTION: GI upset  . Oxycodone Hcl Nausea And Vomiting  . Paroxetine Nausea And Vomiting    Paxil   . Diltiazem Swelling  . Tree Extract Other (See Comments)    "tested and told I was allergic to it; never experienced a reaction to it" (02/20/2012)   Social History   Social History  . Marital status: Divorced    Spouse name: N/A  . Number of children: 2  . Years of education: college   Occupational History  . Disabled     Retired Print production planner  .  Retired   Social History Main Topics  . Smoking status: Passive Smoke Exposure - Never Smoker  . Smokeless tobacco: Never Used     Comment: Parents  . Alcohol use 0.0 oz/week     Comment: 02/20/2012 "couple glasses of wine/6 months"  . Drug use: No  . Sexual activity: No   Other Topics Concern  . Not on file   Social History Narrative   Patient lives at home alone. Patient  divorced.    Patient has her BS degree.   Right handed.   Caffeine- sometimes coffee.      [facility]:     Born in [place], [place]. She worked as a Copywriter, advertising. She has no pets currently. She does have indoor plants. Previously had mold in her home that was remediated. Carpet was removed.               Review of Systems  All other systems reviewed and are negative.      Objective:   Physical Exam  Cardiovascular: Normal rate and regular rhythm.   Pulmonary/Chest: Effort normal and breath sounds normal.  Musculoskeletal:       Right shoulder: She exhibits normal range of motion.       Left shoulder: She exhibits normal range of motion.       Right knee: She exhibits no erythema. No tenderness found. No medial joint line and no lateral joint line tenderness noted.       Left knee: She exhibits normal range of motion, no swelling, no effusion and no erythema. No tenderness found. No medial joint line and no lateral joint line tenderness noted.       Right ankle: She exhibits normal range of motion.       Left ankle: She exhibits normal range of motion.  Vitals reviewed.         Assessment & Plan:  Polyarthralgia  Essential hypertension  Pure hypercholesterolemia  Hyperthyroidism  The polyarthralgias could be due to pseudogout versus left-sided sciatica with neuropathic pain. I recommended trying a prednisone taper pack and reassessing in 2 weeks. Blood pressures well controlled. Cholesterol is outstanding. TSH reveals that her current dose of propylthiouracil 50 mg daily is sufficient. I will make no changes in her medication and recheck in 2 weeks

## 2015-12-26 ENCOUNTER — Encounter: Payer: Self-pay | Admitting: Pulmonary Disease

## 2015-12-26 ENCOUNTER — Telehealth: Payer: Self-pay | Admitting: Pulmonary Disease

## 2015-12-26 ENCOUNTER — Encounter: Payer: Self-pay | Admitting: Family Medicine

## 2015-12-26 MED ORDER — EPINEPHRINE 0.3 MG/0.3ML IJ SOAJ: 0.3000 mg | Freq: Once | INTRAMUSCULAR | 11 refills | Status: AC

## 2015-12-26 NOTE — Telephone Encounter (Signed)
Pt is aware that Rx has been sent to [facility] as requested. Nothing more needed at this time.

## 2015-12-26 NOTE — Telephone Encounter (Signed)
Pt asking for sputum culture results >> AFB results are preliminary, sputum culture and fungus are final JN please advise, thank you.

## 2015-12-26 NOTE — Telephone Encounter (Signed)
Yes the results have not yet finalized.

## 2015-12-27 ENCOUNTER — Telehealth: Payer: Self-pay | Admitting: Pulmonary Disease

## 2015-12-27 NOTE — Telephone Encounter (Signed)
lmtcb

## 2015-12-28 ENCOUNTER — Other Ambulatory Visit: Payer: Self-pay | Admitting: Family Medicine

## 2015-12-28 NOTE — Telephone Encounter (Signed)
Spoke with pt who states she dropped nasonex paper work in early October. Pt states she spoke with [employer] yesterday who states they have not received any paper work on this pt.  Will route to [facility] to f/u on this.

## 2015-12-28 NOTE — Telephone Encounter (Signed)
error 

## 2015-12-28 NOTE — Telephone Encounter (Signed)
lmtcb x2 for pt. 

## 2015-12-29 NOTE — Telephone Encounter (Signed)
Patient would like to speak to someone about the paperwork, pt states the paperwork was dropped off, and will be out of medication soon..the paperwork is to stay in the original state, no copies can be sent. Patient can be reached at..[phone]...ok to leave a message on voicemail.Ebony Scott

## 2015-12-29 NOTE — Telephone Encounter (Signed)
lmtcb X1 for pt. Original forms were mailed approx 1week ago.  These original forms were mailed because they cannot be faxed to [employer].   JN had been out of office for a couple of weeks and the delay was d/t him not being in office to sign forms until 12/06/15.

## 2015-12-29 NOTE — Telephone Encounter (Signed)
Spoke with pt. States that she is going to call [employer] patient assistance on Monday to see if they have received the forms.

## 2015-12-29 NOTE — Telephone Encounter (Signed)
Pt returned call let her know that we don't carry nasonex sample and she was fine, but was concerned about being out of med's soon, please advise.Hillery Hunter

## 2015-12-29 NOTE — Telephone Encounter (Signed)
Patient called back advised that forms were mailed - pt upset it took so long and that she is going to be out of medicine before this application can be processed. She would like samples if possible -pr

## 2015-12-29 NOTE — Telephone Encounter (Addendum)
lmtcb x1 for pt. Unfortunately we do not carry samples of Nasonex anymore.

## 2015-12-30 ENCOUNTER — Encounter: Payer: Self-pay | Admitting: Family Medicine

## 2016-01-01 ENCOUNTER — Encounter: Payer: Self-pay | Admitting: Family Medicine

## 2016-01-02 ENCOUNTER — Encounter: Payer: Self-pay | Admitting: Physician Assistant

## 2016-01-02 ENCOUNTER — Ambulatory Visit (INDEPENDENT_AMBULATORY_CARE_PROVIDER_SITE_OTHER): Payer: Medicare Other | Admitting: Physician Assistant

## 2016-01-02 VITALS — BP 118/52 | HR 92 | Temp 99.2°F | Resp 18 | Wt 126.0 lb

## 2016-01-02 DIAGNOSIS — B9689 Other specified bacterial agents as the cause of diseases classified elsewhere: Secondary | ICD-10-CM

## 2016-01-02 DIAGNOSIS — J988 Other specified respiratory disorders: Secondary | ICD-10-CM | POA: Diagnosis not present

## 2016-01-02 MED ORDER — AZITHROMYCIN 250 MG PO TABS: ORAL_TABLET | ORAL | 0 refills | Status: DC

## 2016-01-02 MED ORDER — FLUCONAZOLE 150 MG PO TABS: 150.0000 mg | ORAL_TABLET | Freq: Once | ORAL | 0 refills | Status: AC

## 2016-01-02 NOTE — Progress Notes (Signed)
Patient ID: Ebony Scott MRN: [id number], DOB: 09-12-1950, 65 y.o. Date of Encounter: 01/02/2016, 11:12 AM    Chief Complaint:  Chief Complaint  Patient presents with  . Cough    yellowish green mucus  . Fever    x5 days     HPI: 65 y.o. year old female presents with above.   Says she has been having congestion in her head and nose as well as her chest. Says that her temperature has been reading 2.6 higher than normal. She is getting yellowish-green mucus out of her nose and also coughing up yellowish-green mucus/phlegm. She's had no significant sore throat or ear ache or sinus pain. Reviewed with her that she has multiple allergies and antibiotic allergies include penicillin tetracycline and Levaquin. She says that azithromycin usually works well for her.     Home Meds:   Outpatient Medications Prior to Visit  Medication Sig Dispense Refill  . acetaminophen (TYLENOL ARTHRITIS PAIN) 650 MG CR tablet Take 650 mg by mouth 2 (two) times daily.     Marland Kitchen albuterol (PROAIR HFA) 108 (90 BASE) MCG/ACT inhaler Inhale 1-2 puffs into the lungs every 6 (six) hours as needed for wheezing or shortness of breath. 1 Inhaler 3  . Alpha-D-Galactosidase (BEANO) TABS Take 1 tablet by mouth daily.     . Alpha-Lipoic Acid 600 MG CAPS Take 600 mg by mouth daily. Once a day    . Azelastine HCl (ASTEPRO) 0.15 % SOLN Place 1 spray into both nostrils at bedtime.    . Calcium Citrate-Vitamin D (CITRACAL MAXIMUM) 315-250 MG-UNIT TABS Take 1 tablet by mouth 2 (two) times daily.     . cetirizine (ZYRTEC) 10 MG tablet Take 1/2 tablet daily    . Cholecalciferol (VITAMIN D) 2000 UNITS CAPS 1/2 at bedtime    . Ciprofloxacin-Fluocinolone (OTOVEL OT) Place in ear(s). As directed by Dr. Thornell Mule    . colchicine 0.6 MG tablet Take 1 tablet (0.6 mg total) by mouth daily. 90 tablet 3  . cyproheptadine (PERIACTIN) 4 MG tablet Take two tablets every evening 60 tablet 5  . denosumab (PROLIA) 60 MG/ML SOLN injection Inject  60 mg into the skin every 6 (six) months. Administer in upper arm, thigh, or abdomen    . dexlansoprazole (DEXILANT) 60 MG capsule Take 1 capsule (60 mg total) by mouth daily. 30 capsule 5  . gabapentin (NEURONTIN) 600 MG tablet Take 1 tablet (600 mg total) by mouth daily. 90 tablet 3  . GuaiFENesin (MUCINEX PO) Take by mouth 2 (two) times daily.    . hydrocortisone (PROCTOZONE-HC) 2.5 % rectal cream Apply as needed as directed 30 g 11  . lactase (LACTAID) 3000 UNITS tablet Take 3 tablets by mouth as needed. 3-4 tabs with dairy liquids and solids    . lipase/protease/amylase (CREON) 12000 UNITS CPEP capsule Take 1 capsule (12,000 Units total) by mouth 3 (three) times daily. CREON 24000-76000-120000 U Takes with meals 270 capsule 5  . mirtazapine (REMERON SOLTAB) 30 MG disintegrating tablet Take 1 tablet (30 mg total) by mouth at bedtime. 30 tablet 11  . mometasone (NASONEX) 50 MCG/ACT nasal spray Place 2 sprays into the nose 2 (two) times daily. 102 g 3  . mometasone-formoterol (DULERA) 200-5 MCG/ACT AERO Inhale 2 puffs into the lungs 2 (two) times daily. 1 Inhaler 5  . montelukast (SINGULAIR) 10 MG tablet TAKE 1 TABLET BY MOUTH ONCE DAILY 30 tablet 11  . mupirocin ointment (BACTROBAN) 2 % Apply inside nose two to three times  a day when needed. 22 g 0  . neomycin-bacitracin-polymyxin (NEOSPORIN) ointment Apply 1 application topically every 12 (twelve) hours. Use on/in nose as directed as needed    . nystatin (MYCOSTATIN) 100000 UNIT/ML suspension USE AS DIRECTED 5MLS IN MOUTH OR THROAT 4 TIMES DAILY 473 mL 1  . nystatin-triamcinolone ointment (MYCOLOG) Apply topically 2 (two) times daily. Reported on 03/08/2015 30 g 1  . omalizumab (XOLAIR) 150 MG injection Inject 150 mg into the skin every 28 (twenty-eight) days. 150 SQ every 4 weeks    . pravastatin (PRAVACHOL) 20 MG tablet Take 1 tablet (20 mg total) by mouth daily. 90 tablet 1  . Prenatal Vit-Sel-Fe Fum-FA (VINATE M) 27-1 MG TABS Take 1 tablet by  mouth daily. 30 each 11  . Probiotic Product (ALIGN) 4 MG CAPS Take 1 tablet by mouth daily. Once a day    . Propylene Glycol-Glycerin (SOOTHE OP) Apply to eye as needed.    . propylthiouracil (PTU) 50 MG tablet take 1/2 tablet (25 mg) twice daily (Patient taking differently: Take 50 mg by mouth daily. take 1/2 tablet (25 mg) twice daily) 30 tablet 5  . ranitidine (ZANTAC) 300 MG tablet Take 1 tablet (300 mg total) by mouth daily. 30 tablet 5  . Respiratory Therapy Supplies (FLUTTER) DEVI Use as directed 1 each 0  . Simethicone (GAS-X EXTRA STRENGTH) 125 MG CAPS Take 2 each by mouth daily as needed. For gas relief    . predniSONE (DELTASONE) 20 MG tablet 3 tabs poqday 1-2, 2 tabs poqday 3-4, 1 tab poqday 5-6 (Patient not taking: Reported on 01/02/2016) 12 tablet 0   Facility-Administered Medications Prior to Visit  Medication Dose Route Frequency Provider Last Rate Last Dose  . omalizumab Arvid Right) injection 150 mg  150 mg Subcutaneous Q14 Days Javier Glazier, MD   150 mg at 09/20/15 1633  . omalizumab Arvid Right) injection 150 mg  150 mg Subcutaneous Q14 Days Javier Glazier, MD   150 mg at 10/19/15 1618  . omalizumab Arvid Right) injection 150 mg  150 mg Subcutaneous Q14 Days Javier Glazier, MD   150 mg at 12/14/15 1217    Allergies:  Allergies  Allergen Reactions  . Dust Mite Extract Shortness Of Breath and Other (See Comments)    "sneezing" (02/20/2012)  . Molds & Smuts Shortness Of Breath  . Morphine Sulfate Itching  . Other Shortness Of Breath and Other (See Comments)    Grass and weeds "sneezing; filled sinuses" (02/20/2012)  . Penicillins Rash and Other (See Comments)    "welts" (02/20/2012)  . Reclast [Zoledronic Acid]     Put in hospital  . Rofecoxib Swelling    Celebrex REACTION: feet swelling  . Shrimp Flavor Anaphylaxis    ALL SHELLFISH  . Tetracycline Hcl Nausea And Vomiting  . Dilaudid [Hydromorphone Hcl] Itching  . Hydrocodone-Acetaminophen Nausea And Vomiting  .  Levofloxacin Other (See Comments)    REACTION: GI upset  . Oxycodone Hcl Nausea And Vomiting  . Paroxetine Nausea And Vomiting    Paxil   . Diltiazem Swelling  . Tree Extract Other (See Comments)    "tested and told I was allergic to it; never experienced a reaction to it" (02/20/2012)      Review of Systems: See HPI for pertinent ROS. All other ROS negative.    Physical Exam: Blood pressure (!) 118/52, pulse 92, temperature 99.2 F (37.3 C), temperature source Oral, resp. rate 18, weight 126 lb (57.2 kg), SpO2 97 %., Body mass index  is 23.05 kg/m. General:  WNWD WF. Appears in no acute distress. HEENT: Normocephalic, atraumatic, eyes without discharge, sclera non-icteric, nares are without discharge. Bilateral auditory canals clear, TM's are without perforation, pearly grey and translucent with reflective cone of light bilaterally. Oral cavity moist, posterior pharynx without exudate, erythema, peritonsillar abscess, or post nasal drip. No tenderness with percussion to frontal or maxillary sinuses bilaterally.  Neck: Supple. No thyromegaly. No lymphadenopathy. Lungs: Clear bilaterally to auscultation without wheezes, rales, or rhonchi. Breathing is unlabored. Heart: Regular rhythm. No murmurs, rubs, or gallops. Msk:  Strength and tone normal for age. Extremities/Skin: Warm and dry.  Neuro: Alert and oriented X 3. Moves all extremities spontaneously. Gait is normal. CNII-XII grossly in tact. Psych:  Responds to questions appropriately with a normal affect.     ASSESSMENT AND PLAN:  65 y.o. year old female with  1. Bacterial respiratory infection She is to take the azithromycin as directed. She states that antibiotic usually causes yeast infection so giving her Diflucan to take at the completion of antibiotic if needed. She is to follow-up if symptoms do not resolve within 1 week after completion of antibiotic. - azithromycin (ZITHROMAX) 250 MG tablet; Day 1: Take 2 daily. Days 2-5:  Take 1 daily.  Dispense: 6 tablet; Refill: 0 - fluconazole (DIFLUCAN) 150 MG tablet; Take 1 tablet (150 mg total) by mouth once.  Dispense: 1 tablet; Refill: 0   Signed, [address] [place], [place], [facility] 01/02/2016 11:12 AM

## 2016-01-04 ENCOUNTER — Encounter: Payer: Self-pay | Admitting: Pulmonary Disease

## 2016-01-04 MED ORDER — ALBUTEROL SULFATE HFA 108 (90 BASE) MCG/ACT IN AERS: 1.0000 | INHALATION_SPRAY | Freq: Four times a day (QID) | RESPIRATORY_TRACT | 5 refills | Status: DC | PRN

## 2016-01-04 NOTE — Telephone Encounter (Signed)
Will forward to [facility] for lab results Proair sent to pharmacy E-mail sent to pt informing her of the above

## 2016-01-04 NOTE — Telephone Encounter (Signed)
The fungal culture and bacterial cultures have finalized as negative. I am still awaiting the final report from the AFB culture. Please let the patient know this. Thank you for sending in the Pro-Air.

## 2016-01-05 ENCOUNTER — Telehealth: Payer: Self-pay

## 2016-01-05 MED ORDER — PREDNISONE 20 MG PO TABS: ORAL_TABLET | ORAL | 0 refills | Status: DC

## 2016-01-05 MED ORDER — CEFDINIR 300 MG PO CAPS: 300.0000 mg | ORAL_CAPSULE | Freq: Two times a day (BID) | ORAL | 0 refills | Status: DC

## 2016-01-05 NOTE — Telephone Encounter (Signed)
Rx filled pt aware 

## 2016-01-05 NOTE — Telephone Encounter (Signed)
Pt states she does not feel any better has not noticed any improvement in her symptoms and is asking for another round of antibiotics.  Still coughing up green mucus

## 2016-01-05 NOTE — Telephone Encounter (Signed)
Omnicef 300 mg 1 by mouth twice a day #20+0 Prednisone 20 mg--take 3 daily for 2 days then take 2 daily for 2 days then take 1 daily for 2 days dispense #12 +0

## 2016-01-06 NOTE — Telephone Encounter (Signed)
JN please advise. Thanks. 

## 2016-01-09 ENCOUNTER — Telehealth: Payer: Self-pay | Admitting: Pulmonary Disease

## 2016-01-09 NOTE — Telephone Encounter (Signed)
Spoke with who states she is currently on prednisone & abx for UTI. Pt is due for her xolair on 01/11/16, pt will not be finished with abx until 01/17/16. Pt also is concerned as to what her next steps are since the sputum culture did not show anything.   JN please advise if it's okay for pt to have injection on 01/11/16. Thanks.

## 2016-01-09 NOTE — Telephone Encounter (Signed)
Called and spoke to pt. Pt upset that she has not recevieved her Nasonex from [employer] pt assistance. Per previous phone note from 11.7.17, the forms were mailed out early November. I informed pt that it may take up to several weeks for [employer] to receive and process forms. Called [employer] at [phone] and was advised they have not received the forms yet but to call back in a few days to check for an update. Called and spoke to pt. Informed her of the update, pt verbalized understanding and states she will call [employer] back on 11.24.17 and see if it was received then. Pt aware to contact us back if they have not received the forms. Will forward to [facility] as FYI. Will sign off.

## 2016-01-10 NOTE — Telephone Encounter (Signed)
LMOMTCB x 1 

## 2016-01-10 NOTE — Telephone Encounter (Signed)
The Xolair injection should be fine. My next step in her workup would be a bronchoscopy as we had discussed before depending on how her cough is doing.

## 2016-01-11 ENCOUNTER — Ambulatory Visit (INDEPENDENT_AMBULATORY_CARE_PROVIDER_SITE_OTHER): Payer: Medicare Other

## 2016-01-11 DIAGNOSIS — J4520 Mild intermittent asthma, uncomplicated: Secondary | ICD-10-CM | POA: Diagnosis not present

## 2016-01-11 NOTE — Telephone Encounter (Signed)
Pt returned call and I let her know that it was fine for her to take injection, she was also checking on status of bronch, she said that cough was better. Pt also said that she needed a spacer for her dulera inhaler when she comes in for her injection if we have them please advise.Ebony Scott

## 2016-01-11 NOTE — Telephone Encounter (Signed)
Called and spoke to pt. Informed her that the next step is the bronch per JN. Pt verbalized understanding and states she is willing to do the bronch. She states her cough is much improved, no longer producing green mucus since she has been taking prednisone x 7 days. Pt states she has also taken 2 rounds of abx recently. Pt is also requesting a spacer.   Dr. Ashok Cordia please advise on bronch specifics if ready to schedule and if for pt to have spacer. Thanks.

## 2016-01-11 NOTE — Telephone Encounter (Signed)
I would hold off on bronchoscopy if her cough has improved. If it returns/worsens then she should call and we will proceed.

## 2016-01-13 ENCOUNTER — Telehealth: Payer: Self-pay

## 2016-01-13 NOTE — Telephone Encounter (Signed)
Fine by me 

## 2016-01-13 NOTE — Telephone Encounter (Signed)
Spoke with pt. And gave her JN recc. Pt. Understood and nothing further is needed at this time.

## 2016-01-13 NOTE — Telephone Encounter (Signed)
JN  Pt. Called wanting to know if it was ok with you, if she get a rx for a new spacer

## 2016-01-16 LAB — AFB CULTURE WITH SMEAR (NOT AT ARMC)

## 2016-01-16 LAB — FUNGUS CULTURE W SMEAR

## 2016-01-16 MED ORDER — OMALIZUMAB 150 MG ~~LOC~~ SOLR
150.0000 mg | SUBCUTANEOUS | Status: DC
  Administered 2016-01-11: 150 mg via SUBCUTANEOUS

## 2016-01-16 MED ORDER — AEROCHAMBER MV MISC: 0 refills | Status: DC

## 2016-01-16 NOTE — Telephone Encounter (Addendum)
Spoke with pt. She is aware that JN is fine with her having a new spacer. Pt would like this sent to her local pharmacy. This has been sent in. Nothing further was needed.

## 2016-01-16 NOTE — Addendum Note (Signed)
Addended by: Desmond Dike C on: 01/16/2016 09:36 AM   Modules accepted: Orders

## 2016-01-19 ENCOUNTER — Telehealth: Payer: Self-pay | Admitting: Pulmonary Disease

## 2016-01-19 NOTE — Telephone Encounter (Signed)
lmtcb

## 2016-01-20 NOTE — Telephone Encounter (Signed)
JN  Please advise  Pt. Called and stated the script for her spacer was going to cost her $13 out of pocket. Is it ok if we give her one from our office? Also she wanted the results from her AFB culture.

## 2016-01-20 NOTE — Telephone Encounter (Signed)
Patient called back - can be reached at [phone] -pr

## 2016-01-22 NOTE — Telephone Encounter (Signed)
If we have more than a couple extra then ok. The AFB from her sputum culture on 10/3 was negative. I don't see a new one since then.

## 2016-01-23 MED ORDER — AEROCHAMBER MV MISC: 0 refills | Status: DC

## 2016-01-23 NOTE — Telephone Encounter (Signed)
Spoke with pt, advised patient that we can leave a spacer for pickup for her, but these are still billed through insurance and she will still be responsible for any amount her insurance does not cover.  Pt expressed understanding.  Spacer left up front for pickup.  Patient also aware of AFB culture results.  Pt is confused as to why her breathing is worsening if she does not have AFB in sputum.  Pt is requesting further explanation/recs from [place].    JN please advise.  Thanks.

## 2016-01-24 ENCOUNTER — Telehealth: Payer: Self-pay | Admitting: Pulmonary Disease

## 2016-01-24 NOTE — Telephone Encounter (Signed)
#   vials:1 Ordered date:01/24/16 Shipping Date:01/25/16

## 2016-01-25 ENCOUNTER — Encounter: Payer: Self-pay | Admitting: Pulmonary Disease

## 2016-01-25 NOTE — Telephone Encounter (Signed)
Dr Ashok Cordia, Pt sent the following email:  Thank you for approving the new spacer. When your nurse told my that you got a new test back from my sputum test was negative. I have reveal questions: 1. The nurse said you got back the newest lab back today, does the mean that I Definitely do not have mycobacteria in my lungs?? What was the white showed you showed me on my lungscan??? 2. Did the 4th sputumhave enough lung cells,/ other bacteria to give you what you needed for myresults. Did the  Does that mean s.,to give you what you need for them,' 3)Since my lung tests, my breathing hasn't got any better. What is your plan to get me better? I hope you have a very blessed day' Ebony Scott  Please advise what to tell the pt, thanks

## 2016-01-25 NOTE — Telephone Encounter (Signed)
#   Vials:1 Arrival Date:01/25/16 Lot [id number] Exp Date:5/21

## 2016-01-26 ENCOUNTER — Other Ambulatory Visit: Payer: Self-pay | Admitting: Adult Health

## 2016-01-26 ENCOUNTER — Telehealth: Payer: Self-pay | Admitting: Family Medicine

## 2016-01-26 ENCOUNTER — Other Ambulatory Visit: Payer: Self-pay

## 2016-01-26 ENCOUNTER — Telehealth: Payer: Self-pay | Admitting: *Deleted

## 2016-01-26 MED ORDER — RANITIDINE HCL 300 MG PO TABS: 300.0000 mg | ORAL_TABLET | Freq: Every day | ORAL | 5 refills | Status: DC

## 2016-01-26 MED ORDER — PANCRELIPASE (LIP-PROT-AMYL) 12000-38000 UNITS PO CPEP: 12000.0000 [IU] | ORAL_CAPSULE | Freq: Three times a day (TID) | ORAL | 5 refills | Status: DC

## 2016-01-26 NOTE — Telephone Encounter (Signed)
See the response in my earlier note.

## 2016-01-26 NOTE — Telephone Encounter (Signed)
Received fax requesting refill on Creon.   Refill appropriate and filled per protocol.

## 2016-01-26 NOTE — Telephone Encounter (Signed)
Analyce CALLING TO MAKE SURE WE ARE PREPARED TO GIVE THE PROLIA SHOT TO HER  [phone]

## 2016-01-26 NOTE — Telephone Encounter (Signed)
She has severe, persistent asthma. I do not ever expect that to go away. Without symptoms her NTM is unlikely to be active but this too is not impossible. At this time it may be helpful to have her evaluated at a tertiary center as a second opinion. My bias would be to refer her to [facility] or [place], but it's entirely up to her.

## 2016-01-26 NOTE — Telephone Encounter (Signed)
Spoke with the pt and notified of recs per JN  She will keep next ov to discuss in further detail

## 2016-01-26 NOTE — Telephone Encounter (Signed)
See other phone note  Pt aware of all recs per JN and they will be discussed in more detail at upcoming visit

## 2016-01-27 ENCOUNTER — Encounter: Payer: Self-pay | Admitting: Family Medicine

## 2016-01-27 ENCOUNTER — Encounter: Payer: Self-pay | Admitting: Internal Medicine

## 2016-01-27 NOTE — Telephone Encounter (Signed)
Left pt message no problem to get her Prolia the week in between Christmas and New years

## 2016-02-02 ENCOUNTER — Ambulatory Visit: Payer: PRIVATE HEALTH INSURANCE | Admitting: Pulmonary Disease

## 2016-02-03 ENCOUNTER — Telehealth: Payer: Self-pay | Admitting: Pulmonary Disease

## 2016-02-03 MED ORDER — MOMETASONE FUROATE 50 MCG/ACT NA SUSP: 2.0000 | Freq: Two times a day (BID) | NASAL | 0 refills | Status: DC

## 2016-02-03 NOTE — Telephone Encounter (Signed)
Patient stated she need Nasonex today.

## 2016-02-03 NOTE — Telephone Encounter (Signed)
Spoke with pt. She needs to have a prescription sent to her local pharmacy for Nasonex. This has been taken care of. Nothing further was needed.

## 2016-02-08 ENCOUNTER — Ambulatory Visit: Payer: PRIVATE HEALTH INSURANCE

## 2016-02-09 ENCOUNTER — Other Ambulatory Visit: Payer: Self-pay | Admitting: Family Medicine

## 2016-02-09 ENCOUNTER — Ambulatory Visit (INDEPENDENT_AMBULATORY_CARE_PROVIDER_SITE_OTHER): Payer: Medicare Other | Admitting: Pulmonary Disease

## 2016-02-09 ENCOUNTER — Telehealth: Payer: Self-pay | Admitting: Allergy and Immunology

## 2016-02-09 ENCOUNTER — Encounter: Payer: Self-pay | Admitting: Pulmonary Disease

## 2016-02-09 ENCOUNTER — Ambulatory Visit (INDEPENDENT_AMBULATORY_CARE_PROVIDER_SITE_OTHER)
Admission: RE | Admit: 2016-02-09 | Discharge: 2016-02-09 | Disposition: A | Discharge status: Home / Self Care | Payer: Medicare Other | Source: Ambulatory Visit | Attending: Pulmonary Disease | Admitting: Pulmonary Disease

## 2016-02-09 ENCOUNTER — Ambulatory Visit (INDEPENDENT_AMBULATORY_CARE_PROVIDER_SITE_OTHER): Payer: Medicare Other

## 2016-02-09 VITALS — BP 144/82 | HR 85 | Temp 98.1°F | Ht 62.0 in | Wt 128.0 lb

## 2016-02-09 DIAGNOSIS — J4550 Severe persistent asthma, uncomplicated: Secondary | ICD-10-CM | POA: Diagnosis not present

## 2016-02-09 DIAGNOSIS — R918 Other nonspecific abnormal finding of lung field: Secondary | ICD-10-CM

## 2016-02-09 DIAGNOSIS — K219 Gastro-esophageal reflux disease without esophagitis: Secondary | ICD-10-CM | POA: Diagnosis not present

## 2016-02-09 DIAGNOSIS — J4520 Mild intermittent asthma, uncomplicated: Secondary | ICD-10-CM

## 2016-02-09 DIAGNOSIS — R05 Cough: Secondary | ICD-10-CM | POA: Diagnosis not present

## 2016-02-09 DIAGNOSIS — R059 Cough, unspecified: Secondary | ICD-10-CM

## 2016-02-09 NOTE — Patient Instructions (Signed)
   Continue taking your medications as prescribed.  I will see you back in 3 months.  We are referring you to [facility] for a second-opinion on your asthma.  Call me if your cough gets worse or becomes more productive.  TESTS ORDERED: 1. Sputum Culture for AFB, Fungus, & Bacteria (hospital lab) 2. CXR PA/LAT today

## 2016-02-09 NOTE — Progress Notes (Signed)
Subjective:    Patient ID: Ebony Scott, female    DOB: [birth year], 65 y.o.   MRN: [id number]  C.C.:  Follow-up for Lung Opacities, Severe, Persistent Asthma, GERD, & Chronic Allergic Rhinitis.  HPI  Tree-in-Bud Lung Opacities: Seen in right upper lobe as well as linear opacities and right middle lobe & lingula. 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 She reports her cough resolved after 2 courses of steroids and antibiotics. Denies any hemoptysis.   Severe, Persistent Asthma:  On Xolair, Dulera, and Singulair therapy. Previously tried [place] without help. She started to notice a "raspy cough" the last couple of nights that seems to be progressively worsening. She reports she is producing minimal mucus and a "trace yellow" tinge. She reports her usual dyspnea. Denies any increased wheezing above her baseline. She will wake up at night coughing frequently. She reports she has only used her rescue inhaler once. Patient's peak flows are still in the upper 200s and at her baseline by her chart.   GERD:  Follows w/ GI & is treated with Dexilant & Ranitidine. She reports she has had some dysphagia lately. No reflux or dyspepsia. No morning brash water taste.   Chronic Allergic Rhinitis:  Previously was evaluated by ENT & follows with Dr. Neldon Mc. On Zyrtec, Singulair, & Nasonex. Reports some intermittent sinus drainage the last week with occasional epistaxis. No sinus pressure or pain.   Review of Systems She reports some hot flashes the last few nights. No fever or sweats. She reports her usual chest tightness. No chest pain or pressure.   Allergies  Allergen Reactions  . Dust Mite Extract Shortness Of Breath and Other (See Comments)    "sneezing" (02/20/2012)  . Molds & Smuts Shortness Of Breath  . Morphine Sulfate Itching  . Other Shortness Of Breath and Other (See Comments)   Grass and weeds "sneezing; filled sinuses" (02/20/2012)  . Penicillins Rash and Other (See Comments)    "welts" (02/20/2012)  . Reclast [Zoledronic Acid]     Put in hospital  . Rofecoxib Swelling    Celebrex REACTION: feet swelling  . Shrimp Flavor Anaphylaxis    ALL SHELLFISH  . Tetracycline Hcl Nausea And Vomiting  . Dilaudid [Hydromorphone Hcl] Itching  . Hydrocodone-Acetaminophen Nausea And Vomiting  . Levofloxacin Other (See Comments)    REACTION: GI upset  . Oxycodone Hcl Nausea And Vomiting  . Paroxetine Nausea And Vomiting    Paxil   . Diltiazem Swelling  . Tree Extract Other (See Comments)    "tested and told I was allergic to it; never experienced a reaction to it" (02/20/2012)    Current Outpatient Prescriptions on File Prior to Visit  Medication Sig Dispense Refill  . acetaminophen (TYLENOL ARTHRITIS PAIN) 650 MG CR tablet Take 650 mg by mouth 2 (two) times daily.     Marland Kitchen albuterol (PROAIR HFA) 108 (90 Base) MCG/ACT inhaler Inhale 1-2 puffs into the lungs every 6 (six) hours as needed for wheezing or shortness of breath. 1 Inhaler 5  . Alpha-D-Galactosidase (BEANO) TABS Take 1 tablet by mouth daily.     . Alpha-Lipoic Acid 600 MG CAPS Take 600 mg by mouth daily. Once a day    . azelastine (ASTELIN) 0.1 % nasal spray USE 2 SPRAYS IN EACH NOSTRIL DAILY AS DIRECTED 30 mL 5  . Calcium Citrate-Vitamin D (CITRACAL MAXIMUM) 315-250 MG-UNIT TABS Take 1 tablet  by mouth 2 (two) times daily.     . cetirizine (ZYRTEC) 10 MG tablet Take 1/2 tablet daily    . Cholecalciferol (VITAMIN D) 2000 UNITS CAPS 1/2 at bedtime    . Ciprofloxacin-Fluocinolone (OTOVEL OT) Place in ear(s). As directed by Dr. Thornell Mule    . colchicine 0.6 MG tablet Take 1 tablet (0.6 mg total) by mouth daily. 90 tablet 3  . cyproheptadine (PERIACTIN) 4 MG tablet Take two tablets every evening 60 tablet 5  . denosumab (PROLIA) 60 MG/ML SOLN injection Inject 60 mg into the skin every 6 (six) months. Administer in upper arm,  thigh, or abdomen    . dexlansoprazole (DEXILANT) 60 MG capsule Take 1 capsule (60 mg total) by mouth daily. 30 capsule 5  . gabapentin (NEURONTIN) 600 MG tablet Take 1 tablet (600 mg total) by mouth daily. 90 tablet 3  . GuaiFENesin (MUCINEX PO) Take by mouth 2 (two) times daily.    . hydrocortisone (PROCTOZONE-HC) 2.5 % rectal cream Apply as needed as directed 30 g 11  . lactase (LACTAID) 3000 UNITS tablet Take 3 tablets by mouth as needed. 3-4 tabs with dairy liquids and solids    . lipase/protease/amylase (CREON) 12000 units CPEP capsule Take 1 capsule (12,000 Units total) by mouth 3 (three) times daily. CREON 24000-76000-120000 U Takes with meals 270 capsule 5  . mirtazapine (REMERON SOLTAB) 30 MG disintegrating tablet Take 1 tablet (30 mg total) by mouth at bedtime. 30 tablet 11  . mometasone (NASONEX) 50 MCG/ACT nasal spray Place 2 sprays into the nose 2 (two) times daily. 17 g 0  . mometasone-formoterol (DULERA) 200-5 MCG/ACT AERO Inhale 2 puffs into the lungs 2 (two) times daily. 1 Inhaler 5  . montelukast (SINGULAIR) 10 MG tablet TAKE 1 TABLET BY MOUTH ONCE DAILY 30 tablet 11  . mupirocin ointment (BACTROBAN) 2 % Apply inside nose two to three times a day when needed. 22 g 0  . nystatin (MYCOSTATIN) 100000 UNIT/ML suspension USE AS DIRECTED 5MLS IN MOUTH OR THROAT 4 TIMES DAILY 473 mL 1  . omalizumab (XOLAIR) 150 MG injection Inject 150 mg into the skin every 28 (twenty-eight) days. 150 SQ every 4 weeks    . pravastatin (PRAVACHOL) 20 MG tablet Take 1 tablet (20 mg total) by mouth daily. 90 tablet 1  . Prenatal Vit-Sel-Fe Fum-FA (VINATE M) 27-1 MG TABS Take 1 tablet by mouth daily. 30 each 11  . Probiotic Product (ALIGN) 4 MG CAPS Take 1 tablet by mouth daily. Once a day    . Propylene Glycol-Glycerin (SOOTHE OP) Apply to eye as needed.    . propylthiouracil (PTU) 50 MG tablet take 1/2 tablet (25 mg) twice daily (Patient taking differently: Take 50 mg by mouth daily. take 1/2 tablet (25  mg) twice daily) 30 tablet 5  . ranitidine (ZANTAC) 300 MG tablet Take 1 tablet (300 mg total) by mouth daily. 30 tablet 5  . Respiratory Therapy Supplies (FLUTTER) DEVI Use as directed 1 each 0  . Simethicone (GAS-X EXTRA STRENGTH) 125 MG CAPS Take 2 each by mouth daily as needed. For gas relief    . Spacer/Aero-Holding Chambers (AEROCHAMBER MV) inhaler Use as instructed 1 each 0  . neomycin-bacitracin-polymyxin (NEOSPORIN) ointment Apply 1 application topically every 12 (twelve) hours. Use on/in nose as directed as needed    . [DISCONTINUED] methimazole (TAPAZOLE) 5 MG tablet Take 5 mg by mouth 3 (three) times daily.       Current Facility-Administered Medications on File Prior to Visit  Medication Dose Route Frequency Provider Last Rate Last Dose  . omalizumab Arvid Right) injection 150 mg  150 mg Subcutaneous Q14 Days Javier Glazier, MD   150 mg at 09/20/15 1633  . omalizumab Arvid Right) injection 150 mg  150 mg Subcutaneous Q14 Days Javier Glazier, MD   150 mg at 10/19/15 1618  . omalizumab Arvid Right) injection 150 mg  150 mg Subcutaneous Q14 Days Javier Glazier, MD   150 mg at 12/14/15 1217  . omalizumab Arvid Right) injection 150 mg  150 mg Subcutaneous Q14 Days Javier Glazier, MD   150 mg at 01/11/16 1156    Past Medical History:  Diagnosis Date  . Allergic rhinitis   . Allergy    SEASONAL  . Anemia   . Anxiety   . Arthritis    "mostly the hands" (02/20/2012)  . Asthma   . Breast cancer ([place]) 1998   in remission  . Cataract    REMOVED  . Chronic lower back pain   . Chronic neck pain   . Complication of anesthesia    "had hard time waking up from it several times" (02/20/2012)  . Depression    "some; don't take anything for it" (02/20/2012)  . Diverticulosis   . DVT (deep venous thrombosis) ([place])   . Exertional dyspnea   . Fibromyalgia 11/2011  . GERD (gastroesophageal reflux disease)   . Graves disease   . Headache(784.0)    "related to allergies; more at different times  during the year" (02/20/2012)  . Hemorrhoids   . Hiatal hernia   . Hypercholesteremia   . Hypothyroidism   . IBS (irritable bowel syndrome)   . Moderate persistent asthma    -FeV1 72% 2011, -IgE 102 2011, CT sinus Neg 2011  . Osteoporosis    on reclast yearly  . Pneumonia 04/2011; ~ 11/2011   "double; single" (02/20/2012)    Past Surgical History:  Procedure Laterality Date  . ANTERIOR AND POSTERIOR REPAIR  1990's  . APPENDECTOMY    . BREAST LUMPECTOMY  1998   left  . CARPOMETACARPEL ([place]) FUSION OF THUMB WITH AUTOGRAFT FROM RADIUS  ~ 2009   "both thumbs" (02/20/2012)  . CATARACT EXTRACTION W/ INTRAOCULAR LENS  IMPLANT, BILATERAL  2012  . CERVICAL DISCECTOMY  10/2001   C5-C6  . CERVICAL FUSION  2003   C3-C4  . CHOLECYSTECTOMY    . COLONOSCOPY    . DEBRIDEMENT TENNIS ELBOW  ?1970's   right  . ESOPHAGOGASTRODUODENOSCOPY    . HYSTERECTOMY    . KNEE ARTHROPLASTY  ?1990's   "?right; w/cartilage repair" (02/20/2012)  . NASAL SEPTUM SURGERY  1980's  . POSTERIOR CERVICAL FUSION/FORAMINOTOMY  2004   "failed initial fusion; rewired  anterior neck" (02/20/2012)  . TONSILLECTOMY  ~ 1953  . VESICOVAGINAL FISTULA CLOSURE W/ TAH  1988    Family History  Problem Relation Age of Onset  . Allergies Mother   . Heart disease Mother   . Arthritis Mother   . Lung cancer Mother   . Diabetes Mother   . Allergies Father   . Heart disease Father   . Arthritis Father   . Stroke Father   . Colon cancer      Maternal half aunt/Maternal half uncle  . Colitis Daughter   . Diabetes Maternal Grandfather     Social History   Social History  . Marital status: Divorced    Spouse name: N/A  . Number of children: 2  . Years of education: college  Occupational History  . Disabled     Retired Print production planner  .  Retired   Social History Main Topics  . Smoking status: Passive Smoke Exposure - Never Smoker  . Smokeless tobacco: Never Used     Comment: Parents  . Alcohol use 0.0  oz/week     Comment: 02/20/2012 "couple glasses of wine/6 months"  . Drug use: No  . Sexual activity: No   Other Topics Concern  . None   Social History Narrative   Patient lives at home alone. Patient  divorced.    Patient has her BS degree.   Right handed.   Caffeine- sometimes coffee.      [facility]:   Born in [place], [place]. She worked as a Copywriter, advertising. She has no pets currently. She does have indoor plants. Previously had mold in her home that was remediated. Carpet was removed.                Objective:   Physical Exam BP (!) 144/82 (BP Location: Left Arm, Cuff Size: Normal)   Pulse 85   Temp 98.1 F (36.7 C) (Oral)   Ht 5\' 2"  (1.575 m)   Wt 128 lb (58.1 kg)   SpO2 98%   BMI 23.41 kg/m  General:  Awake. Caucasian female. Increasing central obesity. Integument:  Warm & dry. No rash on exposed skin.  HEENT:  Moist mucus membranes. No oral ulcers. No significant nasal turbinate swelling bilaterally. Cardiovascular:  Regular rate & rhythm. No edema. Normal S1 & S2. Pulmonary:  Good aeration bilaterally. Clear with auscultation. Normal work of breathing on room air. Abdomen: Soft. Normal bowel sounds. Nontender. Musculoskeletal:  Normal bulk and tone. No joint deformity or effusion appreciated.  PFT 08/08/15: FVC 2.08 L (75%) FEV1 1.73 L (82%) FEV1/FVC 0.83 FEF 25-75 2.41 L (129%) no bronchodilator response                                                                 DLCO corrected 66% (hemoglobin 10.5) 04/29/15: FVC 2.24 L (81%) FEV1 1.95 L (92%) FEV1/FVC 0.87 FEF 25-75 2.59 L (137%) no bronchodilator response TLC 3.91 L (84%) RV 83% ERV 61% DLCO corrected 62% (hemoglobin 13.7) 04/10/12:FVC 1.90 L (69%) FEV1 1.49 L (75%) FEV1/FVC 0.78 FEF 25-75 1.39 L (59%)                                                TLC 3.55 L (80%) RV 62% ERV 52% DLCO uncorrected 72%  6MWT 08/08/15:  Walked 267 meters / Baseline Sat 99% on RA / Nadir Sat 97% on RA  IMAGING CT  CHEST W/O 11/03/15 (previously reviewed by me): No pleural effusion or thickening. No pathologic mediastinal adenopathy. No pericardial effusion. Tree-in-bud opacification within right upper lobe as well as patchy opacification linearly in right middle lobe and inferior lingula.  CT MAXILLOFACIAL W/O 08/12/15 (per radiologist): Paranasal sinuses clear without mucosal thickening or air-fluid level.  CT MAXILLOFACIAL 05/21/13 (per radiologist): Negative for chronic sinus disease.  CT CHEST W/O 06/05/12 (per radiologist): Resolution of previous right apical ground glass. Persistent tree-in-bud nodularity within  basal portion RUL.  MICROBIOLOGY Sputum Ctx (11/22/15):  Normal Oral Flora / AFB negative / Fungus negative  Sputum Ctx (09/06/15):  Abundant Squamous Epithelials / Oral Flora / Mycobacterium gordonae / Fungus negative   LABS 11/09/14 BMP: 141/4.4/104/27/27/0.98/82/9.3 LFT: 4.1/6.5/0.08/17/28/31 CBC: 6.7/12.4/36.9/233  05/31/14 URINE TOX SCREEN: NEGATIVE  12/16/12 IgE: 206.6  04/01/12 RAST PANEL: RAGWEED 0.14 (class 0/1) IgE: 409.9 ASPERGILLUS ANTIGEN: 1.92  01/02/12 RAST PANEL: Weakly Pan-Positive Up to Class 1 IgE: 825.8  06/28/11 IgG: 623 IgA: 134    Assessment & Plan:  65 y.o. female with severe, persistent asthma, chronic allergic rhinitis, & GERD. Patient does have return of her cough which could indicate underlying atypical infection versus simple worsening of her underlying asthma for unclear reasons. I again reviewed the patient's home environment which has not yielded a potential source for her worsening. Her repeat sputum culture was reviewed again today and did not show any nontuberculous mycobacterial infection. We discussed possible bronchoscopy but given the patient is now and again producing mucus with her cough I feel that repeating a sputum culture is reasonable. I will hold off on performing bronchoscopy until the patient is evaluated by [facility]. I  instructed the patient contact my office if she had any new breathing problems or questions before next appointment.  1. Cough: Checking sputum culture for AFB, fungus, and bacteria. Checking chest x-ray PA/LAT today. Holding off on antibiotic therapy at this time.  2. Tree-in-Bud Lung Opacities: Possible nontuberculous mycobacterial colonization versus infection. Repeat chest x-ray PA/LAT today. Repeating sputum AFB and fungal cultures. 3. Severe, Persistent Asthma: Continuing Singulair, Dulera, Xolair, & Pro Air as prescribed. No changes at this time. Referring patient to [facility] for a second opinion and outside evaluation. 4. Chronic Allergic Rhinitis: Patient continuing on Zyrtec, Nasonex, & Singulair. No changes. 5. GERD: Continuing on Dexilant Zantac. Follows with GI. 6. Health Maintenance: S/P Influenza Vaccine September 2017, Prevnar March 2015, Tdap June 2015, & Pneumovax October 2013.   7. Follow-up: Return to clinic in 3 months or sooner if needed.  Sonia Baller Ashok Cordia, M.D. [facility] Pager:  [phone] After 3pm or if no response, call [phone] 1:50 PM 02/09/16

## 2016-02-09 NOTE — Telephone Encounter (Signed)
Please advise 

## 2016-02-09 NOTE — Telephone Encounter (Signed)
She need refills on Nystatin oral suspension. Can this be sent to the [facility] please? If you have questions please call her at [phone].

## 2016-02-10 ENCOUNTER — Telehealth: Payer: Self-pay | Admitting: Pulmonary Disease

## 2016-02-10 MED ORDER — OMALIZUMAB 150 MG ~~LOC~~ SOLR
150.0000 mg | SUBCUTANEOUS | Status: DC
Start: 2016-02-09 — End: 2016-05-09
  Administered 2016-02-10: 150 mg via SUBCUTANEOUS

## 2016-02-10 MED ORDER — NYSTATIN 100000 UNIT/ML MT SUSP: 5.0000 mL | Freq: Four times a day (QID) | OROMUCOSAL | 0 refills | Status: DC

## 2016-02-10 NOTE — Telephone Encounter (Signed)
Nystatin refilled. Made pt aware. Made sure she is rinsing/garling after use and using spacer. She states she is, but she continues to have the fungal issue.

## 2016-02-10 NOTE — Telephone Encounter (Signed)
Called and spoke with pt and she is aware of cxr results.

## 2016-02-10 NOTE — Telephone Encounter (Signed)
Please refill Nystatin oral solution.

## 2016-02-15 ENCOUNTER — Ambulatory Visit (INDEPENDENT_AMBULATORY_CARE_PROVIDER_SITE_OTHER): Payer: Medicare Other | Admitting: Family Medicine

## 2016-02-15 DIAGNOSIS — M810 Age-related osteoporosis without current pathological fracture: Secondary | ICD-10-CM | POA: Diagnosis not present

## 2016-02-15 MED ORDER — DENOSUMAB 60 MG/ML ~~LOC~~ SOLN
60.0000 mg | Freq: Once | SUBCUTANEOUS | Status: AC
  Administered 2016-02-15: 60 mg via SUBCUTANEOUS

## 2016-02-17 ENCOUNTER — Other Ambulatory Visit: Payer: Self-pay | Admitting: Family Medicine

## 2016-02-17 MED ORDER — GABAPENTIN 600 MG PO TABS: 600.0000 mg | ORAL_TABLET | Freq: Every day | ORAL | 3 refills | Status: DC

## 2016-02-23 ENCOUNTER — Telehealth: Payer: Self-pay | Admitting: Pulmonary Disease

## 2016-02-24 NOTE — Telephone Encounter (Signed)
#   Vials:1 Arrival Date:02/24/16 Lot [id number] Exp Date:8/21

## 2016-02-24 NOTE — Telephone Encounter (Signed)
#   vials:1 Ordered date:02/23/16 Shipping Date:02/24/16 Forgot to put it in.

## 2016-02-29 ENCOUNTER — Telehealth: Payer: Self-pay | Admitting: Family Medicine

## 2016-02-29 ENCOUNTER — Encounter: Payer: Self-pay | Admitting: Family Medicine

## 2016-02-29 NOTE — Telephone Encounter (Signed)
Pt requesting her yearly letter for insurance concerning all her OTC medications.  Letter written and to provider for signature.

## 2016-02-29 NOTE — Telephone Encounter (Signed)
Patient inquiring about a letter that you write for her every year regarding her visits here would like to speak to you regarding this [phone] (M)

## 2016-03-01 ENCOUNTER — Telehealth: Payer: Self-pay | Admitting: Pulmonary Disease

## 2016-03-01 NOTE — Telephone Encounter (Signed)
Letter has been signed by provider and I have left message for patient as to how she wants to get it.

## 2016-03-01 NOTE — Telephone Encounter (Signed)
Pt states that she needs a disc made of her most recent chest scans from the last 6 months. I advised that she needed to contact the Medical Records Dept to have this disc made. Pt given the address to the office as well as the phone number to call and make the request. Pt states that the nurse that was working with Dr Ashok Cordia that last time she was here was supposed to fax all her scan reports down to [facility] and the patient is wanting to check and make sure this was done.   Daneil Dan you were working with Dr Ashok Cordia on this day, do you recall faxing any records? The patient states that she remembers the nurse having everything printed and ready to fax. If you do not recall we can contact [facility] (between 8am-5p) to see if any records were received. Thanks.

## 2016-03-02 NOTE — Telephone Encounter (Signed)
Spoke with pt. She states that she contacted our medical records department. Pt was told that she would need to fill out another records release in order to have her most recent CT and CXR's placed on discs. States that she will be going this morning to take care of this. Copies of the reports of these scans will be faxed to [facility] at [phone].

## 2016-03-06 ENCOUNTER — Encounter: Payer: Self-pay | Admitting: Family Medicine

## 2016-03-08 ENCOUNTER — Ambulatory Visit: Payer: PRIVATE HEALTH INSURANCE

## 2016-03-12 ENCOUNTER — Other Ambulatory Visit: Payer: Self-pay | Admitting: Family Medicine

## 2016-03-13 ENCOUNTER — Encounter: Payer: Self-pay | Admitting: Allergy and Immunology

## 2016-03-13 ENCOUNTER — Ambulatory Visit: Payer: PRIVATE HEALTH INSURANCE

## 2016-03-13 ENCOUNTER — Ambulatory Visit (INDEPENDENT_AMBULATORY_CARE_PROVIDER_SITE_OTHER): Payer: Medicare Other | Admitting: Allergy and Immunology

## 2016-03-13 ENCOUNTER — Ambulatory Visit (INDEPENDENT_AMBULATORY_CARE_PROVIDER_SITE_OTHER): Payer: Medicare Other

## 2016-03-13 VITALS — BP 110/70 | HR 80 | Resp 18

## 2016-03-13 DIAGNOSIS — G500 Trigeminal neuralgia: Secondary | ICD-10-CM

## 2016-03-13 DIAGNOSIS — J4550 Severe persistent asthma, uncomplicated: Secondary | ICD-10-CM | POA: Diagnosis not present

## 2016-03-13 DIAGNOSIS — J3089 Other allergic rhinitis: Secondary | ICD-10-CM

## 2016-03-13 DIAGNOSIS — K219 Gastro-esophageal reflux disease without esophagitis: Secondary | ICD-10-CM

## 2016-03-13 DIAGNOSIS — J4520 Mild intermittent asthma, uncomplicated: Secondary | ICD-10-CM

## 2016-03-13 MED ORDER — OMALIZUMAB 150 MG ~~LOC~~ SOLR
150.0000 mg | SUBCUTANEOUS | Status: DC
  Administered 2016-03-13: 150 mg via SUBCUTANEOUS

## 2016-03-13 NOTE — Progress Notes (Signed)
Follow-up Note  Referring Provider: [name], MD Primary Provider: Odette Fraction, MD Date of Office Visit: 03/13/2016  Subjective:   Ebony Scott (DOB: Dec 24, 1950) is a 66 y.o. female who returns to the [facility] on 03/13/2016 in re-evaluation of the following:  HPI: Mekisha returns to this clinic in reevaluation of her allergic rhinoconjunctivitis, facial pain syndrome, reflux-induced respiratory disease, and asthma followed by Dr. Ashok Cordia and treated with Xolair. I last saw her in this clinic in October 2017.  She did require a systemic steroid and antibiotic in December for a head and chest flare that apparently was quite contagious as her friend contracted it at the same time that she did. She had a low-grade fever and some chills and it seems as though she responded appropriately to this therapy.   Otherwise, she is done relatively well with her respiratory tract and has not had a tremendous amount of problems with coughing. As well, she's not really been having any sputum production or chest pain but she still is somewhat short of breath on occasion. 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 She's now been referred to [facility] pulmonary Department for further evaluation regarding this issue and has an appointment on February 12 at [facility]. She still continues to use therapy directed by Dr. Ashok Cordia including the administration of Xolair. Rarely does she use a short acting bronchodilator if ever.  Her nose has been doing relatively well. Her facial pain issue is doing well. She continues on cyproheptadine at nighttime and her sleep is doing well. She takes her cyproheptadine around 8 PM and she gets to bed around 12 PM or so and has a pretty good sleep on most nights.  She still  has some intermittent ear popping on occasion. She's going to be seeing Dr. Thornell Mule in May for this issue. She does not have any associated vertigo or tinnitus or hearing loss.  Her reflux is under very good control at this point in time. She's only had 2 obstructive events with swallowing since I've last seen in this clinic which resolved after several minutes.  The issue with her oral yeast overgrowth is well controlled while consistently using her nystatin oral solution 1 time per day.  She did receive the flu vaccine this year.  Allergies as of 03/13/2016      Reactions   Dust Mite Extract Shortness Of Breath, Other (See Comments)   "sneezing" (02/20/2012)   Molds & Smuts Shortness Of Breath   Morphine Sulfate Itching   Other Shortness Of Breath, Other (See Comments)   Grass and weeds "sneezing; filled sinuses" (02/20/2012)   Penicillins Rash, Other (See Comments)   "welts" (02/20/2012)   Reclast [zoledronic Acid]    Put in hospital   Rofecoxib Swelling   Celebrex REACTION: feet swelling   Shrimp Flavor Anaphylaxis   ALL SHELLFISH   Tetracycline Hcl Nausea And Vomiting   Dilaudid [hydromorphone Hcl] Itching   Hydrocodone-acetaminophen Nausea And Vomiting   Levofloxacin Other (See Comments)   REACTION: GI upset   Oxycodone Hcl Nausea And Vomiting   Paroxetine Nausea And Vomiting   Paxil   Diltiazem Swelling   Tree Extract Other (See Comments)   "tested and told I was allergic to it; never experienced a reaction to it" (02/20/2012)  Medication List      AEROCHAMBER MV inhaler Use as instructed   albuterol 108 (90 Base) MCG/ACT inhaler Commonly known as:  PROAIR HFA Inhale 1-2 puffs into the lungs every 6 (six) hours as needed for wheezing or shortness of breath.   ALIGN 4 MG Caps Take 1 tablet by mouth daily. Once a day   Alpha-Lipoic Acid 600 MG Caps Take 600 mg by mouth daily. Once a day   azelastine 0.1 % nasal spray Commonly known as:  ASTELIN USE 2 SPRAYS IN  EACH NOSTRIL DAILY AS DIRECTED   BEANO Tabs Take 1 tablet by mouth daily.   cetirizine 10 MG tablet Commonly known as:  ZYRTEC Take 1/2 tablet daily   CITRACAL MAXIMUM 315-250 MG-UNIT Tabs Generic drug:  Calcium Citrate-Vitamin D Take 1 tablet by mouth 2 (two) times daily.   colchicine 0.6 MG tablet TAKE 1 TABLET BY MOUTH DAILY   cyproheptadine 4 MG tablet Commonly known as:  PERIACTIN Take two tablets every evening   denosumab 60 MG/ML Soln injection Commonly known as:  PROLIA Inject 60 mg into the skin every 6 (six) months. Administer in upper arm, thigh, or abdomen   dexlansoprazole 60 MG capsule Commonly known as:  DEXILANT Take 1 capsule (60 mg total) by mouth daily.   FLUTTER Devi Use as directed   gabapentin 600 MG tablet Commonly known as:  NEURONTIN Take 1 tablet (600 mg total) by mouth daily.   GAS-X EXTRA STRENGTH 125 MG Caps Generic drug:  Simethicone Take 2 each by mouth daily as needed. For gas relief   hydrocortisone 2.5 % rectal cream Commonly known as:  PROCTOZONE-HC Apply as needed as directed   lactase 3000 units tablet Commonly known as:  LACTAID Take 3 tablets by mouth as needed. 3-4 tabs with dairy liquids and solids   lipase/protease/amylase 12000 units Cpep capsule Commonly known as:  CREON Take 1 capsule (12,000 Units total) by mouth 3 (three) times daily. CREON 24000-76000-120000 U Takes with meals   mirtazapine 30 MG disintegrating tablet Commonly known as:  REMERON SOLTAB Take 1 tablet (30 mg total) by mouth at bedtime.   mometasone 50 MCG/ACT nasal spray Commonly known as:  NASONEX Place 2 sprays into the nose 2 (two) times daily.   mometasone-formoterol 200-5 MCG/ACT Aero Commonly known as:  DULERA Inhale 2 puffs into the lungs 2 (two) times daily.   montelukast 10 MG tablet Commonly known as:  SINGULAIR TAKE 1 TABLET BY MOUTH ONCE DAILY   MUCINEX PO Take by mouth 2 (two) times daily.   mupirocin ointment 2 % Commonly  known as:  BACTROBAN Apply inside nose two to three times a day when needed.   nystatin 100000 UNIT/ML suspension Commonly known as:  MYCOSTATIN Use as directed 5 mLs (500,000 Units total) in the mouth or throat 4 (four) times daily.   nystatin-triamcinolone ointment Commonly known as:  MYCOLOG APPLY TOPICALLY TWICE DAILY   pravastatin 20 MG tablet Commonly known as:  PRAVACHOL Take 1 tablet (20 mg total) by mouth daily.   propylthiouracil 50 MG tablet Commonly known as:  PTU take 1/2 tablet (25 mg) twice daily   ranitidine 300 MG tablet Commonly known as:  ZANTAC Take 1 tablet (300 mg total) by mouth daily.   SIMPLY SALINE 0.9 % Aers Generic drug:  Saline Place into the nose.   SOOTHE OP Apply to eye as needed.   TYLENOL ARTHRITIS PAIN 650 MG CR tablet Generic drug:  acetaminophen Take 650 mg  by mouth 2 (two) times daily.   VINATE M 27-1 MG Tabs Take 1 tablet by mouth daily.   Vitamin D 2000 units Caps 1/2 at bedtime   XOLAIR 150 MG injection Generic drug:  omalizumab Inject 150 mg into the skin every 28 (twenty-eight) days. 150 SQ every 4 weeks       Past Medical History:  Diagnosis Date  . Allergic rhinitis   . Allergy    SEASONAL  . Anemia   . Anxiety   . Arthritis    "mostly the hands" (02/20/2012)  . Asthma   . Breast cancer ([place]) 1998   in remission  . Cataract    REMOVED  . Chronic lower back pain   . Chronic neck pain   . Complication of anesthesia    "had hard time waking up from it several times" (02/20/2012)  . Depression    "some; don't take anything for it" (02/20/2012)  . Diverticulosis   . DVT (deep venous thrombosis) ([place])   . Exertional dyspnea   . Fibromyalgia 11/2011  . GERD (gastroesophageal reflux disease)   . Graves disease   . Headache(784.0)    "related to allergies; more at different times during the year" (02/20/2012)  . Hemorrhoids   . Hiatal hernia   . Hypercholesteremia   . Hypothyroidism   . IBS (irritable bowel  syndrome)   . Moderate persistent asthma    -FeV1 72% 2011, -IgE 102 2011, CT sinus Neg 2011  . Osteoporosis    on reclast yearly  . Pneumonia 04/2011; ~ 11/2011   "double; single" (02/20/2012)    Past Surgical History:  Procedure Laterality Date  . ANTERIOR AND POSTERIOR REPAIR  1990's  . APPENDECTOMY    . BREAST LUMPECTOMY  1998   left  . CARPOMETACARPEL ([place]) FUSION OF THUMB WITH AUTOGRAFT FROM RADIUS  ~ 2009   "both thumbs" (02/20/2012)  . CATARACT EXTRACTION W/ INTRAOCULAR LENS  IMPLANT, BILATERAL  2012  . CERVICAL DISCECTOMY  10/2001   C5-C6  . CERVICAL FUSION  2003   C3-C4  . CHOLECYSTECTOMY    . COLONOSCOPY    . DEBRIDEMENT TENNIS ELBOW  ?1970's   right  . ESOPHAGOGASTRODUODENOSCOPY    . HYSTERECTOMY    . KNEE ARTHROPLASTY  ?1990's   "?right; w/cartilage repair" (02/20/2012)  . NASAL SEPTUM SURGERY  1980's  . POSTERIOR CERVICAL FUSION/FORAMINOTOMY  2004   "failed initial fusion; rewired  anterior neck" (02/20/2012)  . TONSILLECTOMY  ~ 1953  . VESICOVAGINAL FISTULA CLOSURE W/ TAH  1988    Review of systems negative except as noted in HPI / PMHx or noted below:  Review of Systems  Constitutional: Negative.   HENT: Negative.   Eyes: Negative.   Respiratory: Negative.   Cardiovascular: Negative.   Gastrointestinal: Negative.   Genitourinary: Negative.   Musculoskeletal: Negative.   Skin: Negative.   Neurological: Negative.   Endo/Heme/Allergies: Negative.   Psychiatric/Behavioral: Negative.      Objective:   Vitals:   03/13/16 0946  BP: 110/70  Pulse: 80  Resp: 18          Physical Exam  Constitutional: She is well-developed, well-nourished, and in no distress.  HENT:  Head: Normocephalic.  Right Ear: Tympanic membrane, external ear and ear canal normal.  Left Ear: Tympanic membrane, external ear and ear canal normal.  Nose: Nose normal. No mucosal edema or rhinorrhea.  Mouth/Throat: Uvula is midline, oropharynx is clear and moist and mucous  membranes are normal. No  oropharyngeal exudate.  Eyes: Conjunctivae are normal.  Neck: Trachea normal. No tracheal tenderness present. No tracheal deviation present. No thyromegaly present.  Cardiovascular: Normal rate, regular rhythm, S1 normal, S2 normal and normal heart sounds.   No murmur heard. Pulmonary/Chest: Breath sounds normal. No stridor. No respiratory distress. She has no wheezes. She has no rales.  Musculoskeletal: She exhibits no edema.  Lymphadenopathy:       Head (right side): No tonsillar adenopathy present.       Head (left side): No tonsillar adenopathy present.    She has no cervical adenopathy.  Neurological: She is alert. Gait normal.  Skin: No rash noted. She is not diaphoretic. No erythema. Nails show no clubbing.  Psychiatric: Mood and affect normal.    Diagnostics:    Spirometry was performed and demonstrated an FEV1 of 1.46 at 69 % of predicted.  Assessment and Plan:   1. Severe persistent asthma, uncomplicated   2. Other allergic rhinitis   3. Facial pain syndrome   4. LPRD (laryngopharyngeal reflux disease)     1. 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 Continue Periactin 4 mg tablet  2 tablets at bedtime  2. 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 Continue Dexilant 60 mg in the morning  B. Continue ranitidine 300 mg in the evening  4. Continue to treat inflammation:   A. Continue Dulera 200 2 inhalations twice a day  B. Continue Nasonex one spray each nostril 1-2 times a day   C. Continue montelukast 10 mg daily  5. Continue nystatin swish and spit twice a day  6. Continue Xolair and EpiPen as prescribed by Dr. Ashok Cordia  7. Use bronchodilator and Astelin and Zyrtec if needed  8. Return to clinic in June 2018 or earlier if problem   Overall Mayona appears to be doing relatively well regarding her upper airway disease and her facial pain syndrome and her reflux. We'll continue to have her use therapy for reflux  and her cyproheptadine and a nasal steroid and montelukast to address the inflammatory component and the pain component of this issue. Her asthma also appears to be stable with therapy prescribed by Dr. Ashok Cordia. She'll be undergoing further evaluation for her chest CT scan abnormality and positive Mycobacterium culture at [facility] this upcoming month. I will see her back in this clinic in approximately 6 months or earlier if there is a problem.  Allena Katz, MD [place]

## 2016-03-13 NOTE — Progress Notes (Signed)
Documentation and charges have been completed by Lindsay Lemons, CMA based on the Xolair documentation sheet completed by Tammy Scott.  

## 2016-03-13 NOTE — Patient Instructions (Addendum)
  1. 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 Continue Periactin 4 mg tablet  2 tablets at bedtime  2. 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 Continue Dexilant 60 mg in the morning  B. Continue ranitidine 300 mg in the evening  4. Continue to treat inflammation:   A. Continue Dulera 200 2 inhalations twice a day  B. Continue Nasonex one spray each nostril 1-2 times a day   C. Continue montelukast 10 mg daily  5. Continue nystatin swish and spit twice a day  6. Continue Xolair and EpiPen as prescribed by Dr. Ashok Cordia  7. Use bronchodilator and Astelin and Zyrtec if needed  8. Return to clinic in June 2018 or earlier if problem

## 2016-03-15 ENCOUNTER — Telehealth: Payer: Self-pay | Admitting: Internal Medicine

## 2016-03-15 ENCOUNTER — Ambulatory Visit (AMBULATORY_SURGERY_CENTER): Payer: Self-pay | Admitting: *Deleted

## 2016-03-15 DIAGNOSIS — Z1211 Encounter for screening for malignant neoplasm of colon: Secondary | ICD-10-CM

## 2016-03-15 MED ORDER — POLYETHYLENE GLYCOL 3350 17 GM/SCOOP PO POWD: 1.0000 | Freq: Once | ORAL | Status: DC

## 2016-03-15 NOTE — Progress Notes (Signed)
Pt denies allergies to eggs or soy products. Denies difficulty with sedation or anesthesia. Denies any diet or weight loss medications. Denies use of supplemental oxygen.  Emmi instructions given for procedure.  

## 2016-03-16 ENCOUNTER — Other Ambulatory Visit: Payer: Self-pay

## 2016-03-16 MED ORDER — POLYETHYLENE GLYCOL 3350 17 GM/SCOOP PO POWD: 1.0000 | Freq: Once | ORAL | 0 refills | Status: AC

## 2016-03-16 MED ORDER — MOMETASONE FURO-FORMOTEROL FUM 200-5 MCG/ACT IN AERO: 2.0000 | INHALATION_SPRAY | Freq: Two times a day (BID) | RESPIRATORY_TRACT | 5 refills | Status: DC

## 2016-03-16 MED ORDER — BISACODYL 5 MG PO TBEC: 5.0000 mg | DELAYED_RELEASE_TABLET | ORAL | 0 refills | Status: DC

## 2016-03-16 NOTE — Telephone Encounter (Signed)
Pt notified that Miralax and Dulcolax have been sent to her pharmacy

## 2016-03-21 ENCOUNTER — Telehealth: Payer: Self-pay | Admitting: Pulmonary Disease

## 2016-03-21 NOTE — Telephone Encounter (Signed)
#   vials:1 Ordered date:03/21/16 Shipping Date:03/21/16

## 2016-03-22 NOTE — Telephone Encounter (Signed)
#   Vials:1 Arrival Date:03/22/16 Lot [id number] Exp Date:8/21

## 2016-03-29 ENCOUNTER — Encounter: Payer: PRIVATE HEALTH INSURANCE | Admitting: Internal Medicine

## 2016-04-04 ENCOUNTER — Ambulatory Visit (AMBULATORY_SURGERY_CENTER): Payer: Medicare Other | Admitting: Internal Medicine

## 2016-04-04 ENCOUNTER — Encounter: Payer: Self-pay | Admitting: Internal Medicine

## 2016-04-04 VITALS — BP 151/63 | HR 75 | Temp 98.4°F | Resp 14 | Ht 62.0 in | Wt 124.0 lb

## 2016-04-04 DIAGNOSIS — Z1212 Encounter for screening for malignant neoplasm of rectum: Secondary | ICD-10-CM | POA: Diagnosis not present

## 2016-04-04 DIAGNOSIS — Z1211 Encounter for screening for malignant neoplasm of colon: Secondary | ICD-10-CM

## 2016-04-04 DIAGNOSIS — D124 Benign neoplasm of descending colon: Secondary | ICD-10-CM | POA: Diagnosis not present

## 2016-04-04 DIAGNOSIS — D123 Benign neoplasm of transverse colon: Secondary | ICD-10-CM | POA: Diagnosis not present

## 2016-04-04 MED ORDER — SODIUM CHLORIDE 0.9 % IV SOLN: 500.0000 mL | INTRAVENOUS | Status: DC

## 2016-04-04 NOTE — Progress Notes (Signed)
A/ox3, pleased with MAC, report to RN 

## 2016-04-04 NOTE — Op Note (Signed)
[facility] Patient Name: Ebony Scott Procedure Date: 04/04/2016 11:41 AM MRN: [id number] Endoscopist: Gatha Mayer , MD Age: 66 Referring MD:  Date of Birth: 1950/12/26 Gender: Female Account #: [id number] Procedure:                Colonoscopy Indications:              Screening for colorectal malignant neoplasm, Last                            colonoscopy: 2008 Medicines:                Propofol per Anesthesia, Monitored Anesthesia Care Procedure:                Pre-Anesthesia Assessment:                           - Prior to the procedure, a History and Physical                            was performed, and patient medications and                            allergies were reviewed. The patient's tolerance of                            previous anesthesia was also reviewed. The risks                            and benefits of the procedure and the sedation                            options and risks were discussed with the patient.                            All questions were answered, and informed consent                            was obtained. Prior Anticoagulants: The patient has                            taken no previous anticoagulant or antiplatelet                            agents. ASA Grade Assessment: II - A patient with                            mild systemic disease. After reviewing the risks                            and benefits, the patient was deemed in                            satisfactory condition to undergo the procedure.  After obtaining informed consent, the colonoscope                            was passed under direct vision. Throughout the                            procedure, the patient's blood pressure, pulse, and                            oxygen saturations were monitored continuously. The                            Model PCF-H190L [id number]) scope was introduced                            through the anus and  advanced to the the cecum,                            identified by appendiceal orifice and ileocecal                            valve. The colonoscopy was performed without                            difficulty. The patient tolerated the procedure                            well. The quality of the bowel preparation was                            good. The bowel preparation used was Miralax. The                            ileocecal valve, appendiceal orifice, and rectum                            were photographed. Scope In: 11:46:01 AM Scope Out: 12:04:37 PM Scope Withdrawal Time: 0 hours 14 minutes 14 seconds  Total Procedure Duration: 0 hours 18 minutes 36 seconds  Findings:                 The perianal and digital rectal examinations were                            normal.                           Three sessile polyps were found in the descending                            colon and transverse colon. The polyps were                            diminutive in size. These polyps were removed with  a cold snare. Resection and retrieval were                            complete. Verification of patient identification                            for the specimen was done. Estimated blood loss was                            minimal.                           The exam was otherwise without abnormality on                            direct and retroflexion views. Complications:            No immediate complications. Estimated Blood Loss:     Estimated blood loss was minimal. Impression:               - Three diminutive polyps in the descending colon                            and in the transverse colon, removed with a cold                            snare. Resected and retrieved.                           - The examination was otherwise normal on direct                            and retroflexion views. Recommendation:           - Patient has a contact number  available for                            emergencies. The signs and symptoms of potential                            delayed complications were discussed with the                            patient. Return to normal activities tomorrow.                            Written discharge instructions were provided to the                            patient.                           - Resume previous diet.                           - Continue present medications.                           -  Repeat colonoscopy is recommended. The                            colonoscopy date will be determined after pathology                            results from today's exam become available for                            review. Gatha Mayer, MD 04/04/2016 12:08:56 PM This report has been signed electronically.

## 2016-04-04 NOTE — Progress Notes (Signed)
Called to room to assist during endoscopic procedure.  Patient ID and intended procedure confirmed with present staff. Received instructions for my participation in the procedure from the performing physician.  

## 2016-04-04 NOTE — Patient Instructions (Addendum)
   I found and removed 3 small polyps that look benign.  I will let you know pathology results and when to have another routine colonoscopy by mail.  I appreciate the opportunity to care for you. Gatha Mayer, MD, FACG  YOU HAD AN ENDOSCOPIC PROCEDURE TODAY AT [facility]:   Refer to the procedure report that was given to you for any specific questions about what was found during the examination.  If the procedure report does not answer your questions, please call your gastroenterologist to clarify.  If you requested that your care partner not be given the details of your procedure findings, then the procedure report has been included in a sealed envelope for you to review at your convenience later.  YOU SHOULD EXPECT: Some feelings of bloating in the abdomen. Passage of more gas than usual.  Walking can help get rid of the air that was put into your GI tract during the procedure and reduce the bloating. If you had a lower endoscopy (such as a colonoscopy or flexible sigmoidoscopy) you may notice spotting of blood in your stool or on the toilet paper. If you underwent a bowel prep for your procedure, you may not have a normal bowel movement for a few days.  Please Note:  You might notice some irritation and congestion in your nose or some drainage.  This is from the oxygen used during your procedure.  There is no need for concern and it should clear up in a day or so.  SYMPTOMS TO REPORT IMMEDIATELY:   Following lower endoscopy (colonoscopy or flexible sigmoidoscopy):  Excessive amounts of blood in the stool  Significant tenderness or worsening of abdominal pains  Swelling of the abdomen that is new, acute  Fever of 100F or higher  For urgent or emergent issues, a gastroenterologist can be reached at any hour by calling [phone].   DIET:  We do recommend a small meal at first, but then you may proceed to your regular diet.  Drink plenty of fluids but you  should avoid alcoholic beverages for 24 hours.  ACTIVITY:  You should plan to take it easy for the rest of today and you should NOT DRIVE or use heavy machinery until tomorrow (because of the sedation medicines used during the test).    FOLLOW UP: Our staff will call the number listed on your records the next business day following your procedure to check on you and address any questions or concerns that you may have regarding the information given to you following your procedure. If we do not reach you, we will leave a message.  However, if you are feeling well and you are not experiencing any problems, there is no need to return our call.  We will assume that you have returned to your regular daily activities without incident.  If any biopsies were taken you will be contacted by phone or by letter within the next 1-3 weeks.  Please call us at [phone] if you have not heard about the biopsies in 3 weeks.    SIGNATURES/CONFIDENTIALITY: You and/or your care partner have signed paperwork which will be entered into your electronic medical record.  These signatures attest to the fact that that the information above on your After Visit Summary has been reviewed and is understood.  Full responsibility of the confidentiality of this discharge information lies with you and/or your care-partner.

## 2016-04-05 ENCOUNTER — Telehealth: Payer: Self-pay

## 2016-04-05 ENCOUNTER — Telehealth: Payer: Self-pay | Admitting: Internal Medicine

## 2016-04-05 NOTE — Telephone Encounter (Signed)
  Follow up Call-  Call back number 04/04/2016 04/30/2014  Post procedure Call Back phone  # [phone]  Permission to leave phone message Yes Yes  Some recent data might be hidden    Patient was called for follow up after her procedure on 04/04/2016. I spoke with the patient who reports that she was having neck and back pain from a previous injury. Irisa called Dr. Celesta Aver office and was advised which medications she could take that would not interfere with her post procedure. Please see today's office note.

## 2016-04-05 NOTE — Telephone Encounter (Signed)
If she can tale Aleve or ibuprofen she should try that - 2 Aleve or 3-4 ibuprofen  She has an allergy to celebrex so I am not sure if she can take these  I do not think her sxs should be from the procedure - I know temporal association but what she describes not consistent with what we usu see  ? Coming down with a virus

## 2016-04-05 NOTE — Telephone Encounter (Signed)
Patient reports severe pain from her "my neck to the bottom of my feet".  She reports that this pain is all over. "It hurts to move".  She reports that she has tried Tylenol with no relief.  She is asking what else she can take.

## 2016-04-05 NOTE — Telephone Encounter (Signed)
Patient notified

## 2016-04-05 NOTE — Telephone Encounter (Signed)
No answer. Left message that we will try to reach her again this afternoon.

## 2016-04-06 ENCOUNTER — Encounter: Payer: Self-pay | Admitting: Allergy and Immunology

## 2016-04-10 ENCOUNTER — Ambulatory Visit: Payer: PRIVATE HEALTH INSURANCE

## 2016-04-10 ENCOUNTER — Telehealth: Payer: Self-pay | Admitting: Pulmonary Disease

## 2016-04-10 MED ORDER — AZITHROMYCIN 250 MG PO TABS: ORAL_TABLET | ORAL | 0 refills | Status: DC

## 2016-04-10 MED ORDER — FLUCONAZOLE 150 MG PO TABS: 150.0000 mg | ORAL_TABLET | Freq: Every day | ORAL | 0 refills | Status: DC

## 2016-04-10 NOTE — Telephone Encounter (Signed)
Spoke with pt. Stated, "I am not physically well enough to get ready and drive over to your office."  JN - please advise. Thanks.

## 2016-04-10 NOTE — Telephone Encounter (Signed)
Please send in a Z-pack & Diflucan 150mg  tablet - take 1 po daily for a yeast infection (in case she needs it) - #1 pill without refills. She should call if she gets any worse. Thanks.

## 2016-04-10 NOTE — Telephone Encounter (Signed)
Spoke with pt. She is aware of JN's recommendations. Rxs have been sent in. Nothing further was needed.

## 2016-04-10 NOTE — Telephone Encounter (Signed)
Spoke with pt. States that she is not feeling well. Reports cough, fever, increased SOB. Cough is producing yellow/green mucus. Temp has been running around 101. Denies chest tightness, wheezing. Has been taking Delsym OTC with minimal relief. Pt would like to have something called in.  JN - please advise. Thanks.

## 2016-04-10 NOTE — Telephone Encounter (Signed)
Can we get her an appointment either today or tomorrow with someone rather than calling in a Rx? Thanks.

## 2016-04-12 ENCOUNTER — Encounter: Payer: Self-pay | Admitting: Internal Medicine

## 2016-04-12 DIAGNOSIS — Z86010 Personal history of colonic polyps: Secondary | ICD-10-CM

## 2016-04-12 DIAGNOSIS — Z860101 Personal history of adenomatous and serrated colon polyps: Secondary | ICD-10-CM | POA: Insufficient documentation

## 2016-04-12 HISTORY — DX: Personal history of colonic polyps: Z86.010

## 2016-04-12 HISTORY — DX: Personal history of adenomatous and serrated colon polyps: Z86.0101

## 2016-04-12 NOTE — Progress Notes (Signed)
3 small adenomas Recall 3-5 yrs (will send letter in 3) My Chart letter

## 2016-04-15 ENCOUNTER — Emergency Department (HOSPITAL_COMMUNITY): Payer: Medicare Other

## 2016-04-15 ENCOUNTER — Inpatient Hospital Stay (HOSPITAL_COMMUNITY)
Admission: EM | Admit: 2016-04-15 | Discharge: 2016-04-17 | DRG: 194 | Disposition: A | Discharge status: Home Health | Payer: Medicare Other | Attending: Family Medicine | Admitting: Family Medicine

## 2016-04-15 ENCOUNTER — Encounter (HOSPITAL_COMMUNITY): Payer: Self-pay

## 2016-04-15 DIAGNOSIS — J189 Pneumonia, unspecified organism: Secondary | ICD-10-CM | POA: Diagnosis present

## 2016-04-15 DIAGNOSIS — N183 Chronic kidney disease, stage 3 unspecified: Secondary | ICD-10-CM | POA: Diagnosis present

## 2016-04-15 DIAGNOSIS — E785 Hyperlipidemia, unspecified: Secondary | ICD-10-CM | POA: Diagnosis present

## 2016-04-15 DIAGNOSIS — Z888 Allergy status to other drugs, medicaments and biological substances status: Secondary | ICD-10-CM

## 2016-04-15 DIAGNOSIS — N1832 Chronic kidney disease, stage 3b: Secondary | ICD-10-CM | POA: Diagnosis present

## 2016-04-15 DIAGNOSIS — J1000 Influenza due to other identified influenza virus with unspecified type of pneumonia: Secondary | ICD-10-CM | POA: Diagnosis not present

## 2016-04-15 DIAGNOSIS — Z885 Allergy status to narcotic agent status: Secondary | ICD-10-CM

## 2016-04-15 DIAGNOSIS — Z880 Allergy status to penicillin: Secondary | ICD-10-CM

## 2016-04-15 DIAGNOSIS — Z853 Personal history of malignant neoplasm of breast: Secondary | ICD-10-CM | POA: Diagnosis not present

## 2016-04-15 DIAGNOSIS — Z96651 Presence of right artificial knee joint: Secondary | ICD-10-CM | POA: Diagnosis present

## 2016-04-15 DIAGNOSIS — J101 Influenza due to other identified influenza virus with other respiratory manifestations: Secondary | ICD-10-CM | POA: Diagnosis not present

## 2016-04-15 DIAGNOSIS — R531 Weakness: Secondary | ICD-10-CM

## 2016-04-15 DIAGNOSIS — J4551 Severe persistent asthma with (acute) exacerbation: Secondary | ICD-10-CM | POA: Diagnosis present

## 2016-04-15 DIAGNOSIS — Z8701 Personal history of pneumonia (recurrent): Secondary | ICD-10-CM

## 2016-04-15 DIAGNOSIS — J181 Lobar pneumonia, unspecified organism: Secondary | ICD-10-CM | POA: Diagnosis not present

## 2016-04-15 DIAGNOSIS — I5042 Chronic combined systolic (congestive) and diastolic (congestive) heart failure: Secondary | ICD-10-CM | POA: Diagnosis present

## 2016-04-15 DIAGNOSIS — Z86718 Personal history of other venous thrombosis and embolism: Secondary | ICD-10-CM | POA: Diagnosis not present

## 2016-04-15 DIAGNOSIS — M25562 Pain in left knee: Secondary | ICD-10-CM

## 2016-04-15 DIAGNOSIS — I5022 Chronic systolic (congestive) heart failure: Secondary | ICD-10-CM | POA: Diagnosis present

## 2016-04-15 DIAGNOSIS — E860 Dehydration: Secondary | ICD-10-CM | POA: Diagnosis present

## 2016-04-15 DIAGNOSIS — K219 Gastro-esophageal reflux disease without esophagitis: Secondary | ICD-10-CM | POA: Diagnosis present

## 2016-04-15 DIAGNOSIS — Z981 Arthrodesis status: Secondary | ICD-10-CM

## 2016-04-15 DIAGNOSIS — I5043 Acute on chronic combined systolic (congestive) and diastolic (congestive) heart failure: Secondary | ICD-10-CM | POA: Diagnosis present

## 2016-04-15 DIAGNOSIS — M797 Fibromyalgia: Secondary | ICD-10-CM | POA: Diagnosis present

## 2016-04-15 DIAGNOSIS — I5032 Chronic diastolic (congestive) heart failure: Secondary | ICD-10-CM | POA: Diagnosis present

## 2016-04-15 DIAGNOSIS — J45901 Unspecified asthma with (acute) exacerbation: Secondary | ICD-10-CM | POA: Diagnosis present

## 2016-04-15 DIAGNOSIS — M19049 Primary osteoarthritis, unspecified hand: Secondary | ICD-10-CM | POA: Diagnosis present

## 2016-04-15 DIAGNOSIS — Z91013 Allergy to seafood: Secondary | ICD-10-CM | POA: Diagnosis not present

## 2016-04-15 DIAGNOSIS — E0590 Thyrotoxicosis, unspecified without thyrotoxic crisis or storm: Secondary | ICD-10-CM | POA: Diagnosis present

## 2016-04-15 DIAGNOSIS — M1712 Unilateral primary osteoarthritis, left knee: Secondary | ICD-10-CM | POA: Diagnosis present

## 2016-04-15 DIAGNOSIS — E039 Hypothyroidism, unspecified: Secondary | ICD-10-CM | POA: Diagnosis present

## 2016-04-15 LAB — COMPREHENSIVE METABOLIC PANEL
ALT: 30 U/L (ref 14–54)
AST: 35 U/L (ref 15–41)
Albumin: 4 g/dL (ref 3.5–5.0)
Alkaline Phosphatase: 48 U/L (ref 38–126)
Anion gap: 10 (ref 5–15)
BUN: 21 mg/dL — ABNORMAL HIGH (ref 6–20)
CO2: 22 mmol/L (ref 22–32)
Calcium: 8.5 mg/dL — ABNORMAL LOW (ref 8.9–10.3)
Chloride: 106 mmol/L (ref 101–111)
Creatinine, Ser: 1.44 mg/dL — ABNORMAL HIGH (ref 0.44–1.00)
GFR calc Af Amer: 42 mL/min — ABNORMAL LOW (ref >60)
GFR calc non Af Amer: 36 mL/min — ABNORMAL LOW (ref >60)
Glucose, Bld: 116 mg/dL — ABNORMAL HIGH (ref 65–99)
Potassium: 3.7 mmol/L (ref 3.5–5.1)
Sodium: 138 mmol/L (ref 135–145)
Total Bilirubin: 0.5 mg/dL (ref 0.3–1.2)
Total Protein: 8.1 g/dL (ref 6.5–8.1)

## 2016-04-15 LAB — I-STAT TROPONIN, ED: Troponin i, poc: 0.01 ng/mL (ref 0.00–0.08)

## 2016-04-15 LAB — URINALYSIS, ROUTINE W REFLEX MICROSCOPIC
Bilirubin Urine: NEGATIVE
Glucose, UA: NEGATIVE mg/dL
Hgb urine dipstick: NEGATIVE
Ketones, ur: 5 mg/dL — AB
Leukocytes, UA: NEGATIVE
Nitrite: NEGATIVE
Protein, ur: NEGATIVE mg/dL
Specific Gravity, Urine: 1.017 (ref 1.005–1.030)
pH: 6 (ref 5.0–8.0)

## 2016-04-15 LAB — CBC WITH DIFFERENTIAL/PLATELET
Basophils Absolute: 0 10*3/uL (ref 0.0–0.1)
Basophils Relative: 0 %
Eosinophils Absolute: 0 10*3/uL (ref 0.0–0.7)
Eosinophils Relative: 0 %
HCT: 40.8 % (ref 36.0–46.0)
Hemoglobin: 14 g/dL (ref 12.0–15.0)
Lymphocytes Relative: 14 %
Lymphs Abs: 1.6 10*3/uL (ref 0.7–4.0)
MCH: 33 pg (ref 26.0–34.0)
MCHC: 34.3 g/dL (ref 30.0–36.0)
MCV: 96.2 fL (ref 78.0–100.0)
Monocytes Absolute: 1.2 10*3/uL — ABNORMAL HIGH (ref 0.1–1.0)
Monocytes Relative: 11 %
Neutro Abs: 8.5 10*3/uL — ABNORMAL HIGH (ref 1.7–7.7)
Neutrophils Relative %: 75 %
Platelets: 274 10*3/uL (ref 150–400)
RBC: 4.24 MIL/uL (ref 3.87–5.11)
RDW: 13.2 % (ref 11.5–15.5)
WBC: 11.3 10*3/uL — ABNORMAL HIGH (ref 4.0–10.5)

## 2016-04-15 LAB — INFLUENZA PANEL BY PCR (TYPE A & B)
Influenza A By PCR: NEGATIVE
Influenza B By PCR: POSITIVE — AB

## 2016-04-15 LAB — MAGNESIUM: Magnesium: 1.9 mg/dL (ref 1.7–2.4)

## 2016-04-15 LAB — BRAIN NATRIURETIC PEPTIDE: B Natriuretic Peptide: 15.6 pg/mL (ref 0.0–100.0)

## 2016-04-15 LAB — I-STAT CG4 LACTIC ACID, ED: Lactic Acid, Venous: 1.2 mmol/L (ref 0.5–1.9)

## 2016-04-15 LAB — D-DIMER, QUANTITATIVE: D-Dimer, Quant: 2.07 ug/mL-FEU — ABNORMAL HIGH (ref 0.00–0.50)

## 2016-04-15 MED ORDER — MONTELUKAST SODIUM 10 MG PO TABS
10.0000 mg | ORAL_TABLET | Freq: Every day | ORAL | Status: DC
  Administered 2016-04-16: 10 mg via ORAL
  Filled 2016-04-15: qty 1

## 2016-04-15 MED ORDER — RISAQUAD PO CAPS
1.0000 | ORAL_CAPSULE | Freq: Every day | ORAL | Status: DC
  Administered 2016-04-16 – 2016-04-17 (×2): 1 via ORAL
  Filled 2016-04-15 (×2): qty 1

## 2016-04-15 MED ORDER — PANTOPRAZOLE SODIUM 40 MG PO TBEC
40.0000 mg | DELAYED_RELEASE_TABLET | Freq: Every day | ORAL | Status: DC
  Administered 2016-04-16 – 2016-04-17 (×2): 40 mg via ORAL
  Filled 2016-04-15 (×2): qty 1

## 2016-04-15 MED ORDER — SODIUM CHLORIDE 0.9 % IV BOLUS (SEPSIS)
1000.0000 mL | Freq: Once | INTRAVENOUS | Status: AC
  Administered 2016-04-15: 1000 mL via INTRAVENOUS

## 2016-04-15 MED ORDER — GABAPENTIN 300 MG PO CAPS
600.0000 mg | ORAL_CAPSULE | Freq: Every day | ORAL | Status: DC
  Administered 2016-04-16: 600 mg via ORAL
  Filled 2016-04-15: qty 2

## 2016-04-15 MED ORDER — CYPROHEPTADINE HCL 4 MG PO TABS
8.0000 mg | ORAL_TABLET | Freq: Every day | ORAL | Status: DC
  Administered 2016-04-16: 8 mg via ORAL
  Filled 2016-04-15 (×2): qty 2

## 2016-04-15 MED ORDER — LORATADINE 10 MG PO TABS
10.0000 mg | ORAL_TABLET | Freq: Every day | ORAL | Status: DC
  Administered 2016-04-16 – 2016-04-17 (×2): 10 mg via ORAL
  Filled 2016-04-15 (×2): qty 1

## 2016-04-15 MED ORDER — FAMOTIDINE 20 MG PO TABS
40.0000 mg | ORAL_TABLET | Freq: Every day | ORAL | Status: DC
  Administered 2016-04-16: 40 mg via ORAL
  Filled 2016-04-15: qty 2

## 2016-04-15 MED ORDER — VANCOMYCIN HCL IN DEXTROSE 1-5 GM/200ML-% IV SOLN: 1000.0000 mg | Freq: Once | INTRAVENOUS | Status: DC

## 2016-04-15 MED ORDER — SODIUM CHLORIDE 0.9 % IV SOLN
INTRAVENOUS | Status: AC
  Administered 2016-04-15: 23:00:00 via INTRAVENOUS

## 2016-04-15 MED ORDER — HYDROCODONE-ACETAMINOPHEN 5-325 MG PO TABS
1.0000 | ORAL_TABLET | Freq: Four times a day (QID) | ORAL | Status: DC | PRN
  Administered 2016-04-15 – 2016-04-16 (×2): 1 via ORAL
  Filled 2016-04-15 (×2): qty 1

## 2016-04-15 MED ORDER — PROPYLTHIOURACIL 50 MG PO TABS
50.0000 mg | ORAL_TABLET | Freq: Every day | ORAL | Status: DC
  Administered 2016-04-16 – 2016-04-17 (×2): 50 mg via ORAL
  Filled 2016-04-15 (×2): qty 1

## 2016-04-15 MED ORDER — LEVOFLOXACIN IN D5W 750 MG/150ML IV SOLN
750.0000 mg | INTRAVENOUS | Status: DC
  Administered 2016-04-15: 750 mg via INTRAVENOUS
  Filled 2016-04-15: qty 150

## 2016-04-15 MED ORDER — CYCLOBENZAPRINE HCL 10 MG PO TABS
10.0000 mg | ORAL_TABLET | Freq: Once | ORAL | Status: AC
  Administered 2016-04-15: 10 mg via ORAL
  Filled 2016-04-15: qty 1

## 2016-04-15 MED ORDER — ALBUTEROL SULFATE (2.5 MG/3ML) 0.083% IN NEBU
2.5000 mg | INHALATION_SOLUTION | RESPIRATORY_TRACT | Status: DC | PRN
  Administered 2016-04-16 – 2016-04-17 (×2): 2.5 mg via RESPIRATORY_TRACT
  Filled 2016-04-15 (×2): qty 3

## 2016-04-15 MED ORDER — POLYVINYL ALCOHOL 1.4 % OP SOLN
1.0000 [drp] | OPHTHALMIC | Status: DC | PRN
  Filled 2016-04-15: qty 15

## 2016-04-15 MED ORDER — MOMETASONE FURO-FORMOTEROL FUM 200-5 MCG/ACT IN AERO
2.0000 | INHALATION_SPRAY | Freq: Two times a day (BID) | RESPIRATORY_TRACT | Status: DC
  Administered 2016-04-15 – 2016-04-17 (×4): 2 via RESPIRATORY_TRACT
  Filled 2016-04-15: qty 8.8

## 2016-04-15 MED ORDER — VANCOMYCIN HCL 500 MG IV SOLR: 500.0000 mg | INTRAVENOUS | Status: DC

## 2016-04-15 MED ORDER — ONDANSETRON HCL 4 MG/2ML IJ SOLN
4.0000 mg | Freq: Four times a day (QID) | INTRAMUSCULAR | Status: DC | PRN
  Administered 2016-04-15 – 2016-04-16 (×4): 4 mg via INTRAVENOUS
  Filled 2016-04-15 (×4): qty 2

## 2016-04-15 MED ORDER — GUAIFENESIN ER 600 MG PO TB12: 600.0000 mg | ORAL_TABLET | Freq: Two times a day (BID) | ORAL | Status: DC | PRN

## 2016-04-15 MED ORDER — IOPAMIDOL (ISOVUE-370) INJECTION 76%
INTRAVENOUS | Status: AC
  Administered 2016-04-15: 80 mL
  Filled 2016-04-15: qty 100

## 2016-04-15 MED ORDER — DEXTROSE 5 % IV SOLN: 1.0000 g | INTRAVENOUS | Status: DC

## 2016-04-15 MED ORDER — PANCRELIPASE (LIP-PROT-AMYL) 12000-38000 UNITS PO CPEP
12000.0000 [IU] | ORAL_CAPSULE | Freq: Three times a day (TID) | ORAL | Status: DC
  Administered 2016-04-16 – 2016-04-17 (×3): 12000 [IU] via ORAL
  Filled 2016-04-15 (×3): qty 1

## 2016-04-15 MED ORDER — METHYLPREDNISOLONE SODIUM SUCC 125 MG IJ SOLR
60.0000 mg | Freq: Three times a day (TID) | INTRAMUSCULAR | Status: DC
  Administered 2016-04-16 – 2016-04-17 (×4): 60 mg via INTRAVENOUS
  Filled 2016-04-15 (×4): qty 2

## 2016-04-15 MED ORDER — FLUTICASONE PROPIONATE 50 MCG/ACT NA SUSP
2.0000 | Freq: Every day | NASAL | Status: DC
  Administered 2016-04-16 – 2016-04-17 (×2): 2 via NASAL
  Filled 2016-04-15: qty 16

## 2016-04-15 MED ORDER — IPRATROPIUM-ALBUTEROL 0.5-2.5 (3) MG/3ML IN SOLN
3.0000 mL | Freq: Once | RESPIRATORY_TRACT | Status: AC
  Administered 2016-04-15: 3 mL via RESPIRATORY_TRACT
  Filled 2016-04-15: qty 3

## 2016-04-15 MED ORDER — ACETAMINOPHEN 325 MG PO TABS: 650.0000 mg | ORAL_TABLET | Freq: Four times a day (QID) | ORAL | Status: DC | PRN

## 2016-04-15 MED ORDER — ONDANSETRON HCL 4 MG/2ML IJ SOLN
4.0000 mg | Freq: Once | INTRAMUSCULAR | Status: AC
  Administered 2016-04-15: 4 mg via INTRAVENOUS
  Filled 2016-04-15: qty 2

## 2016-04-15 MED ORDER — METHYLPREDNISOLONE SODIUM SUCC 125 MG IJ SOLR
125.0000 mg | Freq: Once | INTRAMUSCULAR | Status: AC
  Administered 2016-04-15: 125 mg via INTRAVENOUS
  Filled 2016-04-15: qty 2

## 2016-04-15 MED ORDER — DEXTROSE 5 % IV SOLN: 2.0000 g | Freq: Once | INTRAVENOUS | Status: DC

## 2016-04-15 MED ORDER — HEPARIN SODIUM (PORCINE) 5000 UNIT/ML IJ SOLN
5000.0000 [IU] | Freq: Three times a day (TID) | INTRAMUSCULAR | Status: DC
  Administered 2016-04-15 – 2016-04-17 (×5): 5000 [IU] via SUBCUTANEOUS
  Filled 2016-04-15 (×5): qty 1

## 2016-04-15 MED ORDER — SIMETHICONE 80 MG PO CHEW
160.0000 mg | CHEWABLE_TABLET | Freq: Every day | ORAL | Status: DC | PRN
  Administered 2016-04-16: 160 mg via ORAL
  Filled 2016-04-15: qty 2

## 2016-04-15 MED ORDER — VITAMIN D3 25 MCG (1000 UNIT) PO TABS
1000.0000 [IU] | ORAL_TABLET | Freq: Every day | ORAL | Status: DC
  Administered 2016-04-16 – 2016-04-17 (×2): 1000 [IU] via ORAL
  Filled 2016-04-15 (×2): qty 1

## 2016-04-15 MED ORDER — PRAVASTATIN SODIUM 20 MG PO TABS
20.0000 mg | ORAL_TABLET | Freq: Every day | ORAL | Status: DC
  Administered 2016-04-16: 20 mg via ORAL
  Filled 2016-04-15: qty 1

## 2016-04-15 MED ORDER — MIRTAZAPINE 30 MG PO TBDP
30.0000 mg | ORAL_TABLET | Freq: Every day | ORAL | Status: DC
  Administered 2016-04-16: 30 mg via ORAL
  Filled 2016-04-15 (×2): qty 1

## 2016-04-15 NOTE — ED Provider Notes (Signed)
[facility] DEPT Provider Note   CSN: [id number] Arrival date & time: 04/15/16  1231     History   Chief Complaint Chief Complaint  Patient presents with  . Cough  . Generalized Body Aches    HPI Ebony Scott is a 66 y.o. female.  HPI   Presents with concern for shortness of breath. Has not been able to eat much this week, low appetite, nausea. Has been in bed, fatigued. Severe cough productive of yellow-green.  Finished azithromycin last night but didn't feel like it helped. Dr. Ashok Cordia was going to follow up with her if this did not work. Also seeing allergy/asthma physician.  Having pain in hands, ankles, back. Haven't taken regular medications because feeling sick.  Has had fevers for a week, over 100-102. Has been taking tylenol every 6 hours.  Last night had severe generalized weakness.  Breathing problems have been ongoing, worse over last 12 months and worse over the last week.  Daughter sick one week ago.    Past Medical History:  Diagnosis Date  . Allergic rhinitis   . Allergy    SEASONAL  . Anemia   . Anxiety    pt denies  . Arthritis    "mostly the hands" (02/20/2012)  . Asthma   . Breast cancer ([place]) 1998   in remission  . Cataract    REMOVED  . Chronic lower back pain   . Chronic neck pain   . Complication of anesthesia    "had hard time waking up from it several times" (02/20/2012)  . Depression    "some; don't take anything for it" (02/20/2012)  . Diverticulosis   . DVT (deep venous thrombosis) ([place])   . Exertional dyspnea   . Fibromyalgia 11/2011  . GERD (gastroesophageal reflux disease)   . Graves disease   . Headache(784.0)    "related to allergies; more at different times during the year" (02/20/2012)  . Hemorrhoids   . Hiatal hernia    back and neck  . Hx of adenomatous colonic polyps 04/12/2016  . Hypercholesteremia    good cholesterol is high  . Hypothyroidism   . IBS (irritable bowel syndrome)   . Moderate persistent asthma    -FeV1 72%  2011, -IgE 102 2011, CT sinus Neg 2011  . Osteoporosis    on reclast yearly  . Pneumonia 04/2011; ~ 11/2011   "double; single" (02/20/2012)    Patient Active Problem List   Diagnosis Date Noted  . Influenza B 04/15/2016  . CKD (chronic kidney disease), stage III 04/15/2016  . Hx of adenomatous colonic polyps 04/12/2016  . Cough 02/09/2016  . Opacity of lung on imaging study 11/03/2015  . Severe persistent asthma 02/08/2015  . Facial pain syndrome 02/08/2015  . Insomnia 02/08/2015  . Other allergic rhinitis 02/08/2015  . LPRD (laryngopharyngeal reflux disease) 02/08/2015  . Chronic diastolic CHF (congestive heart failure) ([place]) 02/08/2015  . Nausea without vomiting 04/08/2014  . Benign paroxysmal positional vertigo 12/03/2012  . Dizziness and giddiness 10/29/2012  . Pseudogout 02/24/2012  . CAP (community acquired pneumonia) 11/28/2011  . Pneumonia 05/31/2011  . Severe asthma with acute exacerbation 01/23/2010  . IRRITABLE BOWEL SYNDROME 01/02/2010  . HYPERLIPIDEMIA 12/23/2009  . Hyperthyroidism 11/12/2006  . Seasonal and perennial allergic rhinitis 11/12/2006  . GERD 11/12/2006  . NECK PAIN, CHRONIC 11/12/2006  . OSTEOPOROSIS 11/12/2006  . BREAST CANCER, HX OF 11/12/2006    Past Surgical History:  Procedure Laterality Date  . ANTERIOR AND POSTERIOR REPAIR  1990's  . APPENDECTOMY    . BREAST LUMPECTOMY  1998   left  . CARPOMETACARPEL ([place]) FUSION OF THUMB WITH AUTOGRAFT FROM RADIUS  ~ 2009   "both thumbs" (02/20/2012)  . CATARACT EXTRACTION W/ INTRAOCULAR LENS  IMPLANT, BILATERAL  2012  . CERVICAL DISCECTOMY  10/2001   C5-C6  . CERVICAL FUSION  2003   C3-C4  . CHOLECYSTECTOMY    . COLONOSCOPY    . DEBRIDEMENT TENNIS ELBOW  ?1970's   right  . ESOPHAGOGASTRODUODENOSCOPY    . HYSTERECTOMY    . KNEE ARTHROPLASTY  ?1990's   "?right; w/cartilage repair" (02/20/2012)  . NASAL SEPTUM SURGERY  1980's  . POSTERIOR CERVICAL FUSION/FORAMINOTOMY  2004   "failed initial fusion;  rewired  anterior neck" (02/20/2012)  . TONSILLECTOMY  ~ 1953  . VESICOVAGINAL FISTULA CLOSURE W/ TAH  1988    OB History    No data available       Home Medications    Prior to Admission medications   Medication Sig Start Date End Date Taking? Authorizing Provider  acetaminophen (TYLENOL ARTHRITIS PAIN) 650 MG CR tablet Take 1,300 mg by mouth 2 (two) times daily.    Yes Historical Provider, MD  Alpha-D-Galactosidase Satira Mccallum) TABS Take 1 tablet by mouth daily.    Yes Historical Provider, MD  Alpha-Lipoic Acid 600 MG CAPS Take 600 mg by mouth daily. Once a day   Yes Historical Provider, MD  azelastine (ASTELIN) 0.1 % nasal spray USE 2 SPRAYS IN EACH NOSTRIL DAILY AS DIRECTED 01/27/16  Yes Javier Glazier, MD  cetirizine (ZYRTEC) 10 MG tablet Take 1/2 tablet daily   Yes Historical Provider, MD  Cholecalciferol (VITAMIN D3) 2000 units capsule 1/2 at bedtime   Yes Historical Provider, MD  colchicine 0.6 MG tablet TAKE 1 TABLET BY MOUTH DAILY 03/12/16  Yes [name], MD  cyproheptadine (PERIACTIN) 4 MG tablet Take two tablets every evening 10/11/15  Yes Jiles Prows, MD  denosumab (PROLIA) 60 MG/ML SOLN injection Inject 60 mg into the skin every 6 (six) months. Administer in upper arm, thigh, or abdomen   Yes Historical Provider, MD  dexlansoprazole (DEXILANT) 60 MG capsule Take 1 capsule (60 mg total) by mouth daily. 10/11/15  Yes Jiles Prows, MD  gabapentin (NEURONTIN) 600 MG tablet Take 1 tablet (600 mg total) by mouth daily. 02/17/16  Yes [name], MD  GuaiFENesin (MUCINEX PO) Take by mouth 2 (two) times daily.   Yes Historical Provider, MD  lactase (LACTAID) 3000 UNITS tablet Take 3 tablets by mouth as needed. 3-4 tabs with dairy liquids and solids   Yes Historical Provider, MD  lipase/protease/amylase (CREON) 12000 units CPEP capsule Take 1 capsule (12,000 Units total) by mouth 3 (three) times daily. CREON 24000-76000-120000 U Takes with meals 01/26/16  Yes [name],  MD  mirtazapine (REMERON SOLTAB) 30 MG disintegrating tablet Take 1 tablet (30 mg total) by mouth at bedtime. 06/06/15  Yes Gatha Mayer, MD  mometasone (NASONEX) 50 MCG/ACT nasal spray Place 2 sprays into the nose 2 (two) times daily. 02/03/16  Yes Javier Glazier, MD  mometasone-formoterol [facility]) 200-5 MCG/ACT AERO Inhale 2 puffs into the lungs 2 (two) times daily. 03/16/16  Yes Jiles Prows, MD  montelukast (SINGULAIR) 10 MG tablet TAKE 1 TABLET BY MOUTH ONCE DAILY 10/12/15  Yes [name], MD  omalizumab Arvid Right) 150 MG injection Inject 150 mg into the skin every 28 (twenty-eight) days. 150 SQ every 4 weeks 05/03/10  Yes Historical Provider, MD  pravastatin (PRAVACHOL) 20 MG tablet Take 1 tablet (20 mg total) by mouth daily. 10/12/15  Yes [name], MD  Prenatal Vit-Sel-Fe Fum-FA (VINATE M) 27-1 MG TABS Take 1 tablet by mouth daily. 03/16/15  Yes [name], MD  PRESCRIPTION MEDICATION Place 1 capsule into both ears daily as needed. Pt has compounded capsule (powder) that she uses In her ears as needed;  CHLORAMP/SULFU/AMPHO B/ H DROC 100-100-5 mg capsule   Yes Historical Provider, MD  Probiotic Product (ALIGN) 4 MG CAPS Take 1 tablet by mouth daily. Once a day   Yes Historical Provider, MD  Propylene Glycol-Glycerin (SOOTHE OP) Apply to eye as needed.   Yes Historical Provider, MD  propylthiouracil (PTU) 50 MG tablet take 1/2 tablet (25 mg) twice daily Patient taking differently: Take 50 mg by mouth daily. take 1/2 tablet (25 mg) twice daily 10/12/15  Yes [name], MD  ranitidine (ZANTAC) 300 MG tablet Take 1 tablet (300 mg total) by mouth daily. 01/26/16  Yes Jiles Prows, MD  Simethicone (GAS-X EXTRA STRENGTH) 125 MG CAPS Take 2 each by mouth daily as needed. For gas relief   Yes Historical Provider, MD  fluconazole (DIFLUCAN) 150 MG tablet Take 1 tablet (150 mg total) by mouth daily. 04/10/16   Javier Glazier, MD  Respiratory Therapy Supplies (FLUTTER) DEVI Use as  directed Patient not taking: Reported on 03/15/2016 10/22/14   Elsie Stain, MD  Spacer/Aero-Holding Chambers (AEROCHAMBER MV) inhaler Use as instructed 01/23/16   Javier Glazier, MD    Family History Family History  Problem Relation Age of Onset  . Allergies Mother   . Heart disease Mother   . Arthritis Mother   . Lung cancer Mother   . Diabetes Mother   . Allergies Father   . Heart disease Father   . Arthritis Father   . Stroke Father   . Colon cancer      Maternal half aunt/Maternal half uncle  . Colitis Daughter   . Diabetes Maternal Grandfather     Social History Social History  Substance Use Topics  . Smoking status: Passive Smoke Exposure - Never Smoker  . Smokeless tobacco: Never Used     Comment: Parents  . Alcohol use 0.0 oz/week     Comment: 02/20/2012 "couple glasses of wine/6 months"     Allergies   Dust mite extract; Molds & smuts; Morphine sulfate; Other; Penicillins; Reclast [zoledronic acid]; Rofecoxib; Shrimp flavor; Tetracycline hcl; Dilaudid [hydromorphone hcl]; Hydrocodone-acetaminophen; Levofloxacin; Oxycodone hcl; Paroxetine; Diltiazem; and Tree extract   Review of Systems Review of Systems  Constitutional: Positive for appetite change, fatigue and fever.  HENT: Positive for congestion and sore throat (yesterday).   Respiratory: Positive for cough and shortness of breath. Negative for wheezing.   Cardiovascular: Negative for chest pain and leg swelling.  Gastrointestinal: Positive for diarrhea (3 times, liquid x 1 week) and nausea. Negative for abdominal pain, constipation and vomiting.  Genitourinary: Negative for dysuria.  Musculoskeletal: Positive for arthralgias and back pain (has chronic). Negative for myalgias.  Skin: Negative for rash.  Neurological: Negative for headaches.     Physical Exam Updated Vital Signs BP 114/88 (BP Location: Right Arm)   Pulse 75   Temp 98.4 F (36.9 C) (Oral)   Resp 18   Ht 5\' 1"  (1.549 m)   Wt 119  lb 7.8 oz (54.2 kg)   SpO2 95%   BMI 22.58 kg/m   Physical Exam  Constitutional: She is oriented to person, place, and time. She appears well-developed and well-nourished. No distress.  Appears fatigued  HENT:  Head: Normocephalic and atraumatic.  Mouth/Throat: No oropharyngeal exudate.  Normal TM  Eyes: Conjunctivae and EOM are normal. Pupils are equal, round, and reactive to light.  Neck: Normal range of motion. Neck supple.  Cardiovascular: Normal rate, regular rhythm, normal heart sounds and intact distal pulses.  Exam reveals no gallop and no friction rub.   No murmur heard. Pulmonary/Chest: Effort normal and breath sounds normal. No respiratory distress. She has no wheezes. She has no rales.  Abdominal: Soft. She exhibits no distension. There is no tenderness. There is no guarding.  Musculoskeletal: She exhibits no edema.       Left knee: She exhibits normal range of motion, no swelling, no effusion, no ecchymosis, no deformity and no erythema. Tenderness found.  Neurological: She is alert and oriented to person, place, and time.  Skin: Skin is warm and dry. No rash noted. She is not diaphoretic. No erythema.  Psychiatric: She has a normal mood and affect.  Nursing note and vitals reviewed.    ED Treatments / Results  Labs (all labs ordered are listed, but only abnormal results are displayed) Labs Reviewed  CBC WITH DIFFERENTIAL/PLATELET - Abnormal; Notable for the following:       Result Value   WBC 11.3 (*)    Neutro Abs 8.5 (*)    Monocytes Absolute 1.2 (*)    All other components within normal limits  COMPREHENSIVE METABOLIC PANEL - Abnormal; Notable for the following:    Glucose, Bld 116 (*)    BUN 21 (*)    Creatinine, Ser 1.44 (*)    Calcium 8.5 (*)    GFR calc non Af Amer 36 (*)    GFR calc Af Amer 42 (*)    All other components within normal limits  URINALYSIS, ROUTINE W REFLEX MICROSCOPIC - Abnormal; Notable for the following:    Ketones, ur 5 (*)    All  other components within normal limits  INFLUENZA PANEL BY PCR (TYPE A & B) - Abnormal; Notable for the following:    Influenza B By PCR POSITIVE (*)    All other components within normal limits  D-DIMER, QUANTITATIVE (NOT AT [facility]) - Abnormal; Notable for the following:    D-Dimer, Quant 2.07 (*)    All other components within normal limits  BASIC METABOLIC PANEL - Abnormal; Notable for the following:    Chloride 113 (*)    CO2 19 (*)    Glucose, Bld 168 (*)    Creatinine, Ser 1.02 (*)    Calcium 7.4 (*)    GFR calc non Af Amer 55 (*)    All other components within normal limits  CULTURE, BLOOD (ROUTINE X 2)  CULTURE, BLOOD (ROUTINE X 2)  CULTURE, EXPECTORATED SPUTUM-ASSESSMENT  BRAIN NATRIURETIC PEPTIDE  MAGNESIUM  STREP PNEUMONIAE URINARY ANTIGEN  HIV ANTIBODY (ROUTINE TESTING)  I-STAT CG4 LACTIC ACID, ED  Randolm Idol, ED    EKG  EKG Interpretation None       Radiology Dg Chest 2 View  Result Date: 04/15/2016 CLINICAL DATA:  Cough, congestion and body aches for 1 week. EXAM: CHEST  2 VIEW COMPARISON:  02/09/2016 and prior chest radiographs FINDINGS: The cardiomediastinal silhouette is unremarkable. There is no evidence of focal airspace disease, pulmonary edema, suspicious pulmonary nodule/mass, pleural effusion, or pneumothorax. No acute bony abnormalities are identified. Surgical clips overlying the left breast and  evidence of cervical spine fusion noted. IMPRESSION: No evidence of acute cardiopulmonary disease. Electronically Signed   By: Margarette Canada M.D.   On: 04/15/2016 14:08   Ct Angio Chest Pe W And/or Wo Contrast  Result Date: 04/15/2016 CLINICAL DATA:  Flu-like symptoms. Knees, nausea, diarrhea and cough. Generalized body aches 1 week. EXAM: CT ANGIOGRAPHY CHEST WITH CONTRAST TECHNIQUE: Multidetector CT imaging of the chest was performed using the standard protocol during bolus administration of intravenous contrast. Multiplanar CT image reconstructions and MIPs  were obtained to evaluate the vascular anatomy. CONTRAST:  80 mL Isovue 370 IV COMPARISON:  11/03/2015 and 06/05/2012 FINDINGS: Cardiovascular: Heart is normal in size. Calcified plaque over the left anterior descending coronary artery. Calcified plaque over the thoracic aorta. No evidence of pulmonary embolism. Mediastinum/Nodes: There is no evidence of hilar or mediastinal adenopathy. Remaining mediastinal structures are within normal. Lungs/Pleura: Lungs are adequately inflated and demonstrate patchy airspace opacification over the right lower lobe likely pneumonia. Minimal density over the medial right middle lobe likely atelectasis. Also minimal bibasilar dependent atelectasis. No evidence of pleural effusion. Airways are within normal. Upper Abdomen: Calcified plaque over the abdominal aorta. Musculoskeletal: Mild degenerate change of the spine. Review of the MIP images confirms the above findings. IMPRESSION: No evidence of pulmonary embolism. Patchy airspace process over the right lower lobe likely a pneumonia. Atherosclerotic coronary artery disease.  Aortic atherosclerosis. Electronically Signed   By: Marin Olp M.D.   On: 04/15/2016 19:17   Dg Knee Complete 4 Views Left  Result Date: 04/16/2016 CLINICAL DATA:  Left knee pain without recent injury. EXAM: LEFT KNEE - COMPLETE 4+ VIEW COMPARISON:  None. FINDINGS: No evidence of fracture, dislocation, or joint effusion. Moderate narrowing of medial joint space is noted. Chondrocalcinosis is noted medially and laterally. Soft tissues are unremarkable. IMPRESSION: Moderate degenerative joint disease. No acute abnormality seen in the left knee. Electronically Signed   By: Marijo Conception, M.D.   On: 04/16/2016 09:28    Procedures Procedures (including critical care time)  Medications Ordered in ED Medications  cholecalciferol (VITAMIN D) tablet 1,000 Units (1,000 Units Oral Given 04/16/16 1044)  mometasone-formoterol (DULERA) 200-5 MCG/ACT inhaler  2 puff (2 puffs Inhalation Given 04/16/16 0923)  fluticasone (FLONASE) 50 MCG/ACT nasal spray 2 spray (2 sprays Each Nare Given 04/16/16 1040)  famotidine (PEPCID) tablet 40 mg (40 mg Oral Not Given 04/15/16 2224)  lipase/protease/amylase (CREON) capsule 12,000 Units (12,000 Units Oral Given 04/16/16 1041)  polyvinyl alcohol (LIQUIFILM TEARS) 1.4 % ophthalmic solution 1 drop (not administered)  montelukast (SINGULAIR) tablet 10 mg (10 mg Oral Not Given 04/15/16 2225)  pravastatin (PRAVACHOL) tablet 20 mg (20 mg Oral Not Given 04/15/16 2225)  propylthiouracil (PTU) tablet 50 mg (50 mg Oral Given 04/16/16 1043)  cyproheptadine (PERIACTIN) 4 MG tablet 8 mg (8 mg Oral Not Given 04/15/16 2224)  pantoprazole (PROTONIX) EC tablet 40 mg (40 mg Oral Given 04/16/16 1043)  loratadine (CLARITIN) tablet 10 mg (10 mg Oral Given 04/16/16 1044)  mirtazapine (REMERON SOL-TAB) disintegrating tablet 30 mg (30 mg Oral Not Given 04/15/16 2225)  acidophilus (RISAQUAD) capsule 1 capsule (1 capsule Oral Given 04/16/16 1045)  simethicone (MYLICON) chewable tablet 160 mg (not administered)  heparin injection 5,000 Units (5,000 Units Subcutaneous Given 04/16/16 0605)  0.9 %  sodium chloride infusion ( Intravenous Stopped 04/16/16 1053)  albuterol (PROVENTIL) (2.5 MG/3ML) 0.083% nebulizer solution 2.5 mg (not administered)  methylPREDNISolone sodium succinate (SOLU-MEDROL) 125 mg/2 mL injection 60 mg (60 mg Intravenous Given  04/16/16 0137)  HYDROcodone-acetaminophen (NORCO/VICODIN) 5-325 MG per tablet 1-2 tablet (1 tablet Oral Given 04/16/16 0604)  ondansetron (ZOFRAN) injection 4 mg (4 mg Intravenous Given 04/16/16 1216)  guaiFENesin-dextromethorphan (ROBITUSSIN DM) 100-10 MG/5ML syrup 10 mL (10 mLs Oral Given 04/16/16 1053)  oseltamivir (TAMIFLU) capsule 30 mg (30 mg Oral Given 04/16/16 1045)  levofloxacin (LEVAQUIN) IVPB 750 mg (not administered)  gabapentin (NEURONTIN) capsule 600 mg (not administered)  diclofenac sodium (VOLTAREN) 1  % transdermal gel 2 g (not administered)  sodium chloride 0.9 % bolus 1,000 mL (0 mLs Intravenous Stopped 04/15/16 1922)  ipratropium-albuterol (DUONEB) 0.5-2.5 (3) MG/3ML nebulizer solution 3 mL (3 mLs Nebulization Given 04/15/16 1727)  ondansetron (ZOFRAN) injection 4 mg (4 mg Intravenous Given 04/15/16 1700)  methylPREDNISolone sodium succinate (SOLU-MEDROL) 125 mg/2 mL injection 125 mg (125 mg Intravenous Given 04/15/16 1700)  cyclobenzaprine (FLEXERIL) tablet 10 mg (10 mg Oral Given 04/15/16 1758)  iopamidol (ISOVUE-370) 76 % injection (80 mLs  Contrast Given 04/15/16 1853)     Initial Impression / Assessment and Plan / ED Course  I have reviewed the triage vital signs and the nursing notes.  Pertinent labs & imaging results that were available during my care of the patient were reviewed by me and considered in my medical decision making (see chart for details).    66 year old female with a history of asthma, breast cancer in remission, DVT in 2013 no longer on anticoagulation, hypercholesterolemia, hypothyroidism, chronic back pain, chronic respiratory problems for which she sees pulmonology and was referred to asthma and immunology, presents with concern for 1 week of worsening cough, shortness of breath, severe generalized weakness, diarrhea, decreased appetite.  For shortness of breath, patient without significant wheezing on exam however given hx of asthma and report of some improvement with nebulizers, will provide duoneb, steroid at this time.  Given dyspnea, checked ECG, troponin, BNP which were WNL.  DDimer elevated.  Given patient reporting fever every day for 1 week, did order blood cx, urine cx.  CXR shows no acute abnormality. Labs done given generalized weakness show no significant findings, borderline low Ca.    CT PE study shows no PE, however concern for pneumonia.  Influenza B test positive. Patient had trial of azithromycin as outpatient, and while she is not septic, she has  severe generalized weakness secondary to infection and may have post-flu pneumonia versus other failure of outpatient abx. Will cover for staph with vanc/cefepime. Pt admitted for further care.    Final Clinical Impressions(s) / ED Diagnoses   Final diagnoses:  Left knee pain  Pneumonia of right lower lobe due to infectious organism [facility])  Influenza B  Generalized weakness    New Prescriptions Current Discharge Medication List       Gareth Morgan, MD 04/16/16 1343

## 2016-04-15 NOTE — ED Notes (Signed)
Hospitalist at bedside 

## 2016-04-15 NOTE — ED Triage Notes (Signed)
Pt here with flu like symptoms.  Pt with weakness, nausea, diarrhea, cough.  Today with generalized body aches.  X 1 week.  Called MD last week and given abx treatment.  Not improved.

## 2016-04-15 NOTE — ED Notes (Signed)
ED Provider at bedside. 

## 2016-04-15 NOTE — Progress Notes (Signed)
Pharmacy Antibiotic Note  Ebony Scott is a 66 y.o. female admitted on 04/15/2016 with pneumonia.  Pharmacy has been consulted for cefepime and vancomycin dosing.  Noted penicillin allergy but appears to have tolerated cephalosporins in the past (cefdinir several courses).  Plan: Vancomycin 1g x 1 then 500 mg q24h.  Cefepime 2g x 1 then 1g q24h. F/u SCr for dose adjustments.     Temp (24hrs), Avg:98.2 F (36.8 C), Min:98.2 F (36.8 C), Max:98.2 F (36.8 C)   Recent Labs Lab 04/15/16 1652 04/15/16 1700 04/15/16 1706  WBC  --  11.3*  --   CREATININE 1.44*  --   --   LATICACIDVEN  --   --  1.20    Estimated Creatinine Clearance: 29.6 mL/min (by C-G formula based on SCr of 1.44 mg/dL (H)).    Allergies  Allergen Reactions  . Dust Mite Extract Shortness Of Breath and Other (See Comments)    "sneezing" (02/20/2012)  . Molds & Smuts Shortness Of Breath  . Morphine Sulfate Itching  . Other Shortness Of Breath and Other (See Comments)    Grass and weeds "sneezing; filled sinuses" (02/20/2012)  . Penicillins Rash and Other (See Comments)    "welts" (02/20/2012)  . Reclast [Zoledronic Acid]     Put in hospital  . Rofecoxib Swelling    Celebrex REACTION: feet swelling  . Shrimp Flavor Anaphylaxis    ALL SHELLFISH  . Tetracycline Hcl Nausea And Vomiting  . Dilaudid [Hydromorphone Hcl] Itching  . Hydrocodone-Acetaminophen Nausea And Vomiting  . Levofloxacin Other (See Comments)    REACTION: GI upset  . Oxycodone Hcl Nausea And Vomiting  . Paroxetine Nausea And Vomiting    Paxil   . Diltiazem Swelling  . Tree Extract Other (See Comments)    "tested and told I was allergic to it; never experienced a reaction to it" (02/20/2012)    Antimicrobials this admission: 2/25 vanc >>  2/25 cefepime >>   Dose adjustments this admission:   Microbiology results: 2/25 BCx: sent  Thank you for allowing pharmacy to be a part of this patient's care.  Hershal Coria 04/15/2016 9:04 PM

## 2016-04-15 NOTE — ED Notes (Signed)
Pt attempted to give a urine sample but was unable to, will try again after the pt gets more fluids

## 2016-04-15 NOTE — H&P (Signed)
History and Physical    Ebony Scott [id number] DOB: 12/24/1950 DOA: 04/15/2016  PCP: Odette Fraction, MD   Patient coming from: Home  Chief Complaint: Cough, dyspnea, fever, gen weakness, aches  HPI: Ebony Scott is a 66 y.o. female with medical history significant for severe persistent asthma with strong atopic component, chronic diastolic CHF, hyperthyroidism, and GERD who presents the emergency department with fevers, cough, generalized weakness, nausea, body aches, and exertional dyspnea. Patient reports that she developed fevers, chills, and upper respiratory symptoms approximately one week ago. This was accompanied by a generalized malaise and aches. Over the ensuing week, the aches and fevers have improved minimally, but her dyspnea, weakness, and cough has progressively worsened. She discussed this with her pulmonologist by phone and was prescribed a Z-Pak which she completed last night. She has not noted any significant improvement with the antibiotic. Patient denies any chest pain or palpitations and denies any lower extremity swelling or tenderness. She denies any headache, change in vision or hearing, loss of coordination, or focal numbness or weakness. Site from azithromycin, the patient has also tried Mucinex and her inhalers without significant improvement.  ED Course: Upon arrival to the ED, patient is found to be afebrile, saturating adequately on room air, with soft blood pressure, and vitals otherwise stable. Patient was dyspneic and working to breathe. Chest x-ray was negative for acute cardiopulmonary disease and chemistry panels notable for a serum creatinine 1.44, up from a baseline of roughly 1.2. CBC features a mild leukocytosis to 11,300. Lactic acid is reassuring at 1.20, troponin is within the normal limits, BNP is 15, and urine is unremarkable. Influenza PCR is positive for flu B. d-dimer is elevated to 2.07. CT AP study was obtained and notable for patchy airspace  disease in the right lower lobe concerning for pneumonia, but negative for PE. Cultures were obtained in the ED, 1 L of normal saline was given, duo nebs were administered, and she was also treated with 125 mg IV Solu-Medrol. Antibiotics were started as well. Blood pressure improved with the IV fluids and the patient continued to oxygenate adequately, but was significantly dyspneic with minimal exertion and demonstrates increased work of breathing while at rest. She will be admitted to the medical/surgical unit on evaluation and management of community-acquired pneumonia, positive for influenza PCR, and resulting exacerbation and her asthma.  Review of Systems:  All other systems reviewed and apart from HPI, are negative.  Past Medical History:  Diagnosis Date  . Allergic rhinitis   . Allergy    SEASONAL  . Anemia   . Anxiety    pt denies  . Arthritis    "mostly the hands" (02/20/2012)  . Asthma   . Breast cancer ([place]) 1998   in remission  . Cataract    REMOVED  . Chronic lower back pain   . Chronic neck pain   . Complication of anesthesia    "had hard time waking up from it several times" (02/20/2012)  . Depression    "some; don't take anything for it" (02/20/2012)  . Diverticulosis   . DVT (deep venous thrombosis) ([place])   . Exertional dyspnea   . Fibromyalgia 11/2011  . GERD (gastroesophageal reflux disease)   . Graves disease   . Headache(784.0)    "related to allergies; more at different times during the year" (02/20/2012)  . Hemorrhoids   . Hiatal hernia    back and neck  . Hx of adenomatous colonic polyps 04/12/2016  . Hypercholesteremia  good cholesterol is high  . Hypothyroidism   . IBS (irritable bowel syndrome)   . Moderate persistent asthma    -FeV1 72% 2011, -IgE 102 2011, CT sinus Neg 2011  . Osteoporosis    on reclast yearly  . Pneumonia 04/2011; ~ 11/2011   "double; single" (02/20/2012)    Past Surgical History:  Procedure Laterality Date  . ANTERIOR AND  POSTERIOR REPAIR  1990's  . APPENDECTOMY    . BREAST LUMPECTOMY  1998   left  . CARPOMETACARPEL ([place]) FUSION OF THUMB WITH AUTOGRAFT FROM RADIUS  ~ 2009   "both thumbs" (02/20/2012)  . CATARACT EXTRACTION W/ INTRAOCULAR LENS  IMPLANT, BILATERAL  2012  . CERVICAL DISCECTOMY  10/2001   C5-C6  . CERVICAL FUSION  2003   C3-C4  . CHOLECYSTECTOMY    . COLONOSCOPY    . DEBRIDEMENT TENNIS ELBOW  ?1970's   right  . ESOPHAGOGASTRODUODENOSCOPY    . HYSTERECTOMY    . KNEE ARTHROPLASTY  ?1990's   "?right; w/cartilage repair" (02/20/2012)  . NASAL SEPTUM SURGERY  1980's  . POSTERIOR CERVICAL FUSION/FORAMINOTOMY  2004   "failed initial fusion; rewired  anterior neck" (02/20/2012)  . TONSILLECTOMY  ~ 1953  . VESICOVAGINAL FISTULA CLOSURE W/ TAH  1988     reports that she is a non-smoker but has been exposed to tobacco smoke. She has never used smokeless tobacco. She reports that she drinks alcohol. She reports that she does not use drugs.  Allergies  Allergen Reactions  . Dust Mite Extract Shortness Of Breath and Other (See Comments)    "sneezing" (02/20/2012)  . Molds & Smuts Shortness Of Breath  . Morphine Sulfate Itching  . Other Shortness Of Breath and Other (See Comments)    Grass and weeds "sneezing; filled sinuses" (02/20/2012)  . Penicillins Rash and Other (See Comments)    "welts" (02/20/2012)  . Reclast [Zoledronic Acid]     Put in hospital  . Rofecoxib Swelling    Celebrex REACTION: feet swelling  . Shrimp Flavor Anaphylaxis    ALL SHELLFISH  . Tetracycline Hcl Nausea And Vomiting  . Dilaudid [Hydromorphone Hcl] Itching  . Hydrocodone-Acetaminophen Nausea And Vomiting  . Levofloxacin Other (See Comments)    REACTION: GI upset  . Oxycodone Hcl Nausea And Vomiting  . Paroxetine Nausea And Vomiting    Paxil   . Diltiazem Swelling  . Tree Extract Other (See Comments)    "tested and told I was allergic to it; never experienced a reaction to it" (02/20/2012)    Family History    Problem Relation Age of Onset  . Allergies Mother   . Heart disease Mother   . Arthritis Mother   . Lung cancer Mother   . Diabetes Mother   . Allergies Father   . Heart disease Father   . Arthritis Father   . Stroke Father   . Colon cancer      Maternal half aunt/Maternal half uncle  . Colitis Daughter   . Diabetes Maternal Grandfather      Prior to Admission medications   Medication Sig Start Date End Date Taking? Authorizing Provider  acetaminophen (TYLENOL ARTHRITIS PAIN) 650 MG CR tablet Take 1,300 mg by mouth 2 (two) times daily.    Yes Historical Provider, MD  Alpha-D-Galactosidase Satira Mccallum) TABS Take 1 tablet by mouth daily.    Yes Historical Provider, MD  Alpha-Lipoic Acid 600 MG CAPS Take 600 mg by mouth daily. Once a day   Yes Historical Provider, MD  azelastine (ASTELIN) 0.1 % nasal spray USE 2 SPRAYS IN EACH NOSTRIL DAILY AS DIRECTED 01/27/16  Yes Javier Glazier, MD  cetirizine (ZYRTEC) 10 MG tablet Take 1/2 tablet daily   Yes Historical Provider, MD  Cholecalciferol (VITAMIN D3) 2000 units capsule 1/2 at bedtime   Yes Historical Provider, MD  colchicine 0.6 MG tablet TAKE 1 TABLET BY MOUTH DAILY 03/12/16  Yes [name], MD  cyproheptadine (PERIACTIN) 4 MG tablet Take two tablets every evening 10/11/15  Yes Jiles Prows, MD  denosumab (PROLIA) 60 MG/ML SOLN injection Inject 60 mg into the skin every 6 (six) months. Administer in upper arm, thigh, or abdomen   Yes Historical Provider, MD  dexlansoprazole (DEXILANT) 60 MG capsule Take 1 capsule (60 mg total) by mouth daily. 10/11/15  Yes Jiles Prows, MD  gabapentin (NEURONTIN) 600 MG tablet Take 1 tablet (600 mg total) by mouth daily. 02/17/16  Yes [name], MD  GuaiFENesin (MUCINEX PO) Take by mouth 2 (two) times daily.   Yes Historical Provider, MD  lactase (LACTAID) 3000 UNITS tablet Take 3 tablets by mouth as needed. 3-4 tabs with dairy liquids and solids   Yes Historical Provider, MD   lipase/protease/amylase (CREON) 12000 units CPEP capsule Take 1 capsule (12,000 Units total) by mouth 3 (three) times daily. CREON 24000-76000-120000 U Takes with meals 01/26/16  Yes [name], MD  mirtazapine (REMERON SOLTAB) 30 MG disintegrating tablet Take 1 tablet (30 mg total) by mouth at bedtime. 06/06/15  Yes Gatha Mayer, MD  mometasone (NASONEX) 50 MCG/ACT nasal spray Place 2 sprays into the nose 2 (two) times daily. 02/03/16  Yes Javier Glazier, MD  mometasone-formoterol [facility]) 200-5 MCG/ACT AERO Inhale 2 puffs into the lungs 2 (two) times daily. 03/16/16  Yes Jiles Prows, MD  montelukast (SINGULAIR) 10 MG tablet TAKE 1 TABLET BY MOUTH ONCE DAILY 10/12/15  Yes [name], MD  omalizumab Arvid Right) 150 MG injection Inject 150 mg into the skin every 28 (twenty-eight) days. 150 SQ every 4 weeks 05/03/10  Yes Historical Provider, MD  pravastatin (PRAVACHOL) 20 MG tablet Take 1 tablet (20 mg total) by mouth daily. 10/12/15  Yes [name], MD  Prenatal Vit-Sel-Fe Fum-FA (VINATE M) 27-1 MG TABS Take 1 tablet by mouth daily. 03/16/15  Yes [name], MD  PRESCRIPTION MEDICATION Place 1 capsule into both ears daily as needed. Pt has compounded capsule (powder) that she uses In her ears as needed;  CHLORAMP/SULFU/AMPHO B/ H DROC 100-100-5 mg capsule   Yes Historical Provider, MD  Probiotic Product (ALIGN) 4 MG CAPS Take 1 tablet by mouth daily. Once a day   Yes Historical Provider, MD  Propylene Glycol-Glycerin (SOOTHE OP) Apply to eye as needed.   Yes Historical Provider, MD  propylthiouracil (PTU) 50 MG tablet take 1/2 tablet (25 mg) twice daily Patient taking differently: Take 50 mg by mouth daily. take 1/2 tablet (25 mg) twice daily 10/12/15  Yes [name], MD  ranitidine (ZANTAC) 300 MG tablet Take 1 tablet (300 mg total) by mouth daily. 01/26/16  Yes Jiles Prows, MD  Simethicone (GAS-X EXTRA STRENGTH) 125 MG CAPS Take 2 each by mouth daily as needed. For gas  relief   Yes Historical Provider, MD  fluconazole (DIFLUCAN) 150 MG tablet Take 1 tablet (150 mg total) by mouth daily. 04/10/16   Javier Glazier, MD  Respiratory Therapy Supplies (FLUTTER) DEVI Use as directed Patient not taking: Reported on 03/15/2016  10/22/14   Elsie Stain, MD  Spacer/Aero-Holding Josiah Lobo (AEROCHAMBER MV) inhaler Use as instructed 01/23/16   Javier Glazier, MD    Physical Exam: Vitals:   04/15/16 1451 04/15/16 1700 04/15/16 1727 04/15/16 1830  BP: (!) 114/50 (!) 127/50  (!) 129/54  Pulse: 78 78  86  Resp: 14 19  20   Temp:      TempSrc:      SpO2: 99% 98% 97% 94%      Constitutional: No acute distress, calm, in apparent discomfort Eyes: PERTLA, lids and conjunctivae normal ENMT: Mucous membranes are moist. Posterior pharynx clear of any exudate or lesions.   Neck: normal, supple, no masses, no thyromegaly Respiratory: Expiratory wheezes, prolonged expiratory phase. Coughing. No accessory muscle use.  Cardiovascular: S1 & S2 heard, regular rate and rhythm. No extremity edema. No significant JVD. Abdomen: No distension, no tenderness, no masses palpated. Bowel sounds normal.  Musculoskeletal: no clubbing / cyanosis. No joint deformity upper and lower extremities.   Skin: no significant rashes, lesions, ulcers. Warm, dry, well-perfused. Neurologic: CN 2-12 grossly intact. Sensation intact, DTR normal. Strength 5/5 in all 4 limbs.  Psychiatric: Normal judgment and insight. Alert and oriented x 3. Normal mood and affect.     Labs on Admission: I have personally reviewed following labs and imaging studies  CBC:  Recent Labs Lab 04/15/16 1700  WBC 11.3*  NEUTROABS 8.5*  HGB 14.0  HCT 40.8  MCV 96.2  PLT 123456   Basic Metabolic Panel:  Recent Labs Lab 04/15/16 1652  NA 138  K 3.7  CL 106  CO2 22  GLUCOSE 116*  BUN 21*  CREATININE 1.44*  CALCIUM 8.5*  MG 1.9   GFR: Estimated Creatinine Clearance: 29.6 mL/min (by C-G formula based on SCr  of 1.44 mg/dL (H)). Liver Function Tests:  Recent Labs Lab 04/15/16 1652  AST 35  ALT 30  ALKPHOS 48  BILITOT 0.5  PROT 8.1  ALBUMIN 4.0   No results for input(s): LIPASE, AMYLASE in the last 168 hours. No results for input(s): AMMONIA in the last 168 hours. Coagulation Profile: No results for input(s): INR, PROTIME in the last 168 hours. Cardiac Enzymes: No results for input(s): CKTOTAL, CKMB, CKMBINDEX, TROPONINI in the last 168 hours. BNP (last 3 results) No results for input(s): PROBNP in the last 8760 hours. HbA1C: No results for input(s): HGBA1C in the last 72 hours. CBG: No results for input(s): GLUCAP in the last 168 hours. Lipid Profile: No results for input(s): CHOL, HDL, LDLCALC, TRIG, CHOLHDL, LDLDIRECT in the last 72 hours. Thyroid Function Tests: No results for input(s): TSH, T4TOTAL, FREET4, T3FREE, THYROIDAB in the last 72 hours. Anemia Panel: No results for input(s): VITAMINB12, FOLATE, FERRITIN, TIBC, IRON, RETICCTPCT in the last 72 hours. Urine analysis:    Component Value Date/Time   COLORURINE YELLOW 04/15/2016 1919   APPEARANCEUR CLEAR 04/15/2016 1919   LABSPEC 1.017 04/15/2016 1919   PHURINE 6.0 04/15/2016 1919   GLUCOSEU NEGATIVE 04/15/2016 1919   HGBUR NEGATIVE 04/15/2016 [place] NEGATIVE 04/15/2016 1919   KETONESUR 5 (A) 04/15/2016 1919   PROTEINUR NEGATIVE 04/15/2016 1919   UROBILINOGEN 0.2 05/17/2014 1113   NITRITE NEGATIVE 04/15/2016 1919   LEUKOCYTESUR NEGATIVE 04/15/2016 1919   Sepsis Labs: @LABRCNTIP (procalcitonin:4,lacticidven:4) )No results found for this or any previous visit (from the past 240 hour(s)).   Radiological Exams on Admission: Dg Chest 2 View  Result Date: 04/15/2016 CLINICAL DATA:  Cough, congestion and body aches for 1 week.  EXAM: CHEST  2 VIEW COMPARISON:  02/09/2016 and prior chest radiographs FINDINGS: The cardiomediastinal silhouette is unremarkable. There is no evidence of focal airspace disease,  pulmonary edema, suspicious pulmonary nodule/mass, pleural effusion, or pneumothorax. No acute bony abnormalities are identified. Surgical clips overlying the left breast and evidence of cervical spine fusion noted. IMPRESSION: No evidence of acute cardiopulmonary disease. Electronically Signed   By: Margarette Canada M.D.   On: 04/15/2016 14:08   Ct Angio Chest Pe W And/or Wo Contrast  Result Date: 04/15/2016 CLINICAL DATA:  Flu-like symptoms. Knees, nausea, diarrhea and cough. Generalized body aches 1 week. EXAM: CT ANGIOGRAPHY CHEST WITH CONTRAST TECHNIQUE: Multidetector CT imaging of the chest was performed using the standard protocol during bolus administration of intravenous contrast. Multiplanar CT image reconstructions and MIPs were obtained to evaluate the vascular anatomy. CONTRAST:  80 mL Isovue 370 IV COMPARISON:  11/03/2015 and 06/05/2012 FINDINGS: Cardiovascular: Heart is normal in size. Calcified plaque over the left anterior descending coronary artery. Calcified plaque over the thoracic aorta. No evidence of pulmonary embolism. Mediastinum/Nodes: There is no evidence of hilar or mediastinal adenopathy. Remaining mediastinal structures are within normal. Lungs/Pleura: Lungs are adequately inflated and demonstrate patchy airspace opacification over the right lower lobe likely pneumonia. Minimal density over the medial right middle lobe likely atelectasis. Also minimal bibasilar dependent atelectasis. No evidence of pleural effusion. Airways are within normal. Upper Abdomen: Calcified plaque over the abdominal aorta. Musculoskeletal: Mild degenerate change of the spine. Review of the MIP images confirms the above findings. IMPRESSION: No evidence of pulmonary embolism. Patchy airspace process over the right lower lobe likely a pneumonia. Atherosclerotic coronary artery disease.  Aortic atherosclerosis. Electronically Signed   By: Marin Olp M.D.   On: 04/15/2016 19:17    EKG: Not performed, will  obtain as appropriate.   Assessment/Plan  1. CAP, influenza B - Pt developed flu-like illness a week ago with fevers, aches, malaise, cough, nausea - Over the ensuing days, the cough became more severe and she has become increasingly dyspneic  - She was given azithromycin by an outpatient physician, she completed it 04/14/16, but with no appreciable improvement - RLL pneumonia identified on imaging, mild leukocytosis noted, lactic acid reassuring  - Blood cultures were obtained in ED and patient is started on Levaquin in light of penicillin allergy  - She has tested positive for influenza B, but as symptoms began 1 week prior, Tamiflu not started; will maintain droplet precautions   - Supportive care with APAP, prn supplemental O2, nebs  2. Asthma with acute exacerbation  - Pt has severe persistent asthma with strong atopic component, followed by pulmonology  - She presents with exacerbation, likely precipitated by the infection  - She was given 125 mg IV Solu-Medrol in ED and will be continued on Solu-Medrol 60 mg IV q8h for now   - Continue nebs, Singulair, antihistamines   3. Chronic diastolic CHF  - Appears a little dehydrated on admission, was given 1 liter NS in ED and has been continued on gentle IVF hydration  - TTE (10/08/14) with EF 0000000, grade 1 diastolic dysfunction, no significant WMA or valvular disease - Does not require diuretic at home  - Follow wts and I/O    4. CKD stage III  - SCr is 1.44 on admission, up from 1.22 last October, and 1-1.1 prior to that  - She was given 1 liter NS in ED and is continued on gentle NS hydration  - Avoid nephrotoxins where  possible, renally-dose medications as appropriate, repeat chemistries in am   5. GERD - EGD (04/30/14) with normal-appearing esophagus, stomach, and duodenum - Managed at home with Zantac and Dexilant; will continue H2-blocker and PPI    6. Hyperthyroidism - Appears to be stable, will continue PTU   DVT  prophylaxis: sq heparin  Code Status: Full  Family Communication: Son updated at bedside with patient's permission  Disposition Plan: Admit to med-surg Consults called: None Admission status: Inpatient    Vianne Bulls, MD [facility] Pager [phone]  If 7PM-7AM, please contact night-coverage [email or web address] Password [facility]  04/15/2016, 9:27 PM

## 2016-04-15 NOTE — ED Notes (Signed)
Respiratory called for breathing treatment.

## 2016-04-15 NOTE — ED Notes (Signed)
Patient transported to CT 

## 2016-04-15 NOTE — Progress Notes (Signed)
New Admission Note:   Arrival Method: via stretcher from ED Mental Orientation: A&O x4 Assessment: Completed Skin: warm, dry, and intact IV: Left Antecubital 20g Peripheral IV with NS @ 174mL/hr Pain: c/o 7/10 sharp pain to Left knee Tubes: N/A Safety Measures: Educated on fall prevention safety plan, patient acknowledged and understood. Admission: Completed [address] Orientation: Patient has been orientated to the room, unit and staff.  Family: Daughter updated via phone  Orders have been reviewed and implemented. Will continue to monitor the patient. Call light has been placed within reach and bed alarm has been activated.   Dorothea Glassman, RN

## 2016-04-16 ENCOUNTER — Inpatient Hospital Stay (HOSPITAL_COMMUNITY): Payer: Medicare Other

## 2016-04-16 DIAGNOSIS — J181 Lobar pneumonia, unspecified organism: Secondary | ICD-10-CM

## 2016-04-16 DIAGNOSIS — E0590 Thyrotoxicosis, unspecified without thyrotoxic crisis or storm: Secondary | ICD-10-CM

## 2016-04-16 DIAGNOSIS — I5032 Chronic diastolic (congestive) heart failure: Secondary | ICD-10-CM

## 2016-04-16 DIAGNOSIS — K219 Gastro-esophageal reflux disease without esophagitis: Secondary | ICD-10-CM

## 2016-04-16 DIAGNOSIS — N183 Chronic kidney disease, stage 3 (moderate): Secondary | ICD-10-CM

## 2016-04-16 DIAGNOSIS — J4551 Severe persistent asthma with (acute) exacerbation: Secondary | ICD-10-CM

## 2016-04-16 DIAGNOSIS — J101 Influenza due to other identified influenza virus with other respiratory manifestations: Secondary | ICD-10-CM

## 2016-04-16 LAB — STREP PNEUMONIAE URINARY ANTIGEN: Strep Pneumo Urinary Antigen: NEGATIVE

## 2016-04-16 LAB — BASIC METABOLIC PANEL
Anion gap: 8 (ref 5–15)
BUN: 17 mg/dL (ref 6–20)
CO2: 19 mmol/L — ABNORMAL LOW (ref 22–32)
Calcium: 7.4 mg/dL — ABNORMAL LOW (ref 8.9–10.3)
Chloride: 113 mmol/L — ABNORMAL HIGH (ref 101–111)
Creatinine, Ser: 1.02 mg/dL — ABNORMAL HIGH (ref 0.44–1.00)
GFR calc Af Amer: >60 mL/min (ref >60)
GFR calc non Af Amer: 55 mL/min — ABNORMAL LOW (ref >60)
Glucose, Bld: 168 mg/dL — ABNORMAL HIGH (ref 65–99)
Potassium: 3.7 mmol/L (ref 3.5–5.1)
Sodium: 140 mmol/L (ref 135–145)

## 2016-04-16 MED ORDER — OSELTAMIVIR PHOSPHATE 30 MG PO CAPS
30.0000 mg | ORAL_CAPSULE | Freq: Two times a day (BID) | ORAL | Status: DC
  Filled 2016-04-16: qty 1

## 2016-04-16 MED ORDER — GUAIFENESIN-DM 100-10 MG/5ML PO SYRP
10.0000 mL | ORAL_SOLUTION | ORAL | Status: DC | PRN
  Administered 2016-04-16 – 2016-04-17 (×5): 10 mL via ORAL
  Filled 2016-04-16 (×5): qty 10

## 2016-04-16 MED ORDER — GABAPENTIN 300 MG PO CAPS: 600.0000 mg | ORAL_CAPSULE | Freq: Every day | ORAL | Status: DC

## 2016-04-16 MED ORDER — OSELTAMIVIR PHOSPHATE 30 MG PO CAPS
30.0000 mg | ORAL_CAPSULE | Freq: Every day | ORAL | Status: DC
  Administered 2016-04-16: 30 mg via ORAL
  Filled 2016-04-16 (×2): qty 1

## 2016-04-16 MED ORDER — DICLOFENAC SODIUM 1 % TD GEL
2.0000 g | Freq: Four times a day (QID) | TRANSDERMAL | Status: DC
  Administered 2016-04-16 – 2016-04-17 (×3): 2 g via TOPICAL
  Filled 2016-04-16: qty 100

## 2016-04-16 MED ORDER — DICLOFENAC SODIUM 50 MG PO TBEC: 50.0000 mg | DELAYED_RELEASE_TABLET | Freq: Two times a day (BID) | ORAL | Status: DC

## 2016-04-16 MED ORDER — LEVOFLOXACIN IN D5W 750 MG/150ML IV SOLN: 750.0000 mg | INTRAVENOUS | Status: DC

## 2016-04-16 MED ORDER — IBUPROFEN 200 MG PO TABS: 600.0000 mg | ORAL_TABLET | Freq: Four times a day (QID) | ORAL | Status: DC | PRN

## 2016-04-16 NOTE — Progress Notes (Signed)
PHARMACY NOTE:  ANTIMICROBIAL RENAL DOSAGE ADJUSTMENT  Current antimicrobial regimen includes a mismatch between antimicrobial dosage and estimated renal function.  As per policy approved by the Pharmacy & Therapeutics and Medical Executive Committees, the antimicrobial dosage will be adjusted accordingly.  Current antimicrobial dosage:  Levaquin 750mg  IV q24 and Tamfilu 30mg  PO BID  Indication: CAP and Flu  Renal Function:  Estimated Creatinine Clearance: 28.2 mL/min (by C-G formula based on SCr of 1.44 mg/dL (H)). []      On intermittent HD, scheduled: []      On CRRT    Antimicrobial dosage has been changed to:  Levaquin changed to 750mg  IV q48 and Tamiflu changed to 30mg  once daily  Additional comments:   Thank you for allowing pharmacy to be a part of this patient's care.  Kara Mead, [facility] 04/16/2016 8:38 AM

## 2016-04-16 NOTE — Progress Notes (Signed)
PROGRESS NOTE    Ebony Scott  [id number] DOB: 08-10-1950 DOA: 04/15/2016 PCP: Odette Fraction, MD   Brief Narrative:  Ebony Scott is a 66 y.o. -year-old female history significant for   Assessment & Plan:   Principal Problem:   Pneumonia Active Problems:   Hyperthyroidism   Severe asthma with acute exacerbation   GERD   CAP (community acquired pneumonia)   Chronic diastolic CHF (congestive heart failure) ([place])   Influenza B   CKD (chronic kidney disease), stage III   Community acquired pneumonia Right lower lobe. On room air. -continue Levaquin -flutter valve -sputum gram/culture pending -Robitussin  Influenza infection -start Tamiflu since patient has been admitted (renally adjusted)  Asthma with acute exacerbation Improving but not at baseline. Fear patient has a high risk of decompensation with asthma exacerbation in setting of two respiratory infections -continue IV steroids -continue albuterol -continue Dulera  Left knee pain Likely arthritis. No injury. Patient not wanting to bear weight because of pain -left knee x-ray -ibuprofen 600mg  q6 hours -PT/OT eval -Voltaren gel  Hyperlipidemia -continue pravastatin  CKD stage III Creatinine improved with IV fluids -will continue to avoid nephrotoxic agents and renally adjust medications  GERD -continue H2-blocker and PPI  Hyperthyroidism Stable. -continue PTU   DVT prophylaxis: Heparin Code Status: Full code Family Communication: Daughter on telephone Disposition Plan: Anticipate discharge home tomorrow   Consultants:   None  Procedures:   None  Antimicrobials:  Levaquin   Subjective: Patient still feels short of breath. She reports left knee pain and is unable to bear weight from pain.   Objective: Vitals:   04/15/16 2240 04/15/16 2246 04/16/16 0446 04/16/16 0923  BP:   114/88   Pulse:   75   Resp:   18   Temp:   98.4 F (36.9 C)   TempSrc:   Oral   SpO2:  98% 99% 95%   Weight: 54.2 kg (119 lb 7.8 oz)     Height: 5\' 1"  (1.549 m)       Intake/Output Summary (Last 24 hours) at 04/16/16 1054 Last data filed at 04/15/16 1922  Gross per 24 hour  Intake             1000 ml  Output                0 ml  Net             1000 ml   Filed Weights   04/15/16 2240  Weight: 54.2 kg (119 lb 7.8 oz)    Examination:  General exam: Appears calm and comfortable Respiratory system: Clear to auscultation. Respiratory effort normal. Cardiovascular system: S1 & S2 heard, RRR. No murmurs, rubs, gallops or clicks. Gastrointestinal system: Abdomen is nondistended, soft and nontender. Normal bowel sounds heard. Central nervous system: Alert and oriented. No focal neurological deficits. Extremities: No edema. No calf tenderness. Some medial joint tenderness of left knee. No laxity. Skin: No cyanosis. No rashes Psychiatry: Judgement and insight appear normal. Mood & affect appropriate.     Data Reviewed: I have personally reviewed following labs and imaging studies  CBC:  Recent Labs Lab 04/15/16 1700  WBC 11.3*  NEUTROABS 8.5*  HGB 14.0  HCT 40.8  MCV 96.2  PLT 123456   Basic Metabolic Panel:  Recent Labs Lab 04/15/16 1652 04/16/16 0935  NA 138 140  K 3.7 3.7  CL 106 113*  CO2 22 19*  GLUCOSE 116* 168*  BUN 21* 17  CREATININE  1.44* 1.02*  CALCIUM 8.5* 7.4*  MG 1.9  --    GFR: Estimated Creatinine Clearance: 39.8 mL/min (by C-G formula based on SCr of 1.02 mg/dL (H)). Liver Function Tests:  Recent Labs Lab 04/15/16 1652  AST 35  ALT 30  ALKPHOS 48  BILITOT 0.5  PROT 8.1  ALBUMIN 4.0   No results for input(s): LIPASE, AMYLASE in the last 168 hours. No results for input(s): AMMONIA in the last 168 hours. Coagulation Profile: No results for input(s): INR, PROTIME in the last 168 hours. Cardiac Enzymes: No results for input(s): CKTOTAL, CKMB, CKMBINDEX, TROPONINI in the last 168 hours. BNP (last 3 results) No results for input(s): PROBNP  in the last 8760 hours. HbA1C: No results for input(s): HGBA1C in the last 72 hours. CBG: No results for input(s): GLUCAP in the last 168 hours. Lipid Profile: No results for input(s): CHOL, HDL, LDLCALC, TRIG, CHOLHDL, LDLDIRECT in the last 72 hours. Thyroid Function Tests: No results for input(s): TSH, T4TOTAL, FREET4, T3FREE, THYROIDAB in the last 72 hours. Anemia Panel: No results for input(s): VITAMINB12, FOLATE, FERRITIN, TIBC, IRON, RETICCTPCT in the last 72 hours. Sepsis Labs:  Recent Labs Lab 04/15/16 1706  LATICACIDVEN 1.20    Recent Results (from the past 240 hour(s))  Blood culture (routine x 2)     Status: None (Preliminary result)   Collection Time: 04/15/16  4:55 PM  Result Value Ref Range Status   Specimen Description BLOOD LEFT ARM  Final   Special Requests BOTTLES DRAWN AEROBIC AND ANAEROBIC 5CC EA  Final   Culture PENDING  Incomplete   Report Status PENDING  Incomplete  Blood culture (routine x 2)     Status: None (Preliminary result)   Collection Time: 04/15/16  5:00 PM  Result Value Ref Range Status   Specimen Description BLOOD RIGHT FOREARM  Final   Special Requests IN PEDIATRIC BOTTLE 3CC  Final   Culture PENDING  Incomplete   Report Status PENDING  Incomplete         Radiology Studies: Dg Chest 2 View  Result Date: 04/15/2016 CLINICAL DATA:  Cough, congestion and body aches for 1 week. EXAM: CHEST  2 VIEW COMPARISON:  02/09/2016 and prior chest radiographs FINDINGS: The cardiomediastinal silhouette is unremarkable. There is no evidence of focal airspace disease, pulmonary edema, suspicious pulmonary nodule/mass, pleural effusion, or pneumothorax. No acute bony abnormalities are identified. Surgical clips overlying the left breast and evidence of cervical spine fusion noted. IMPRESSION: No evidence of acute cardiopulmonary disease. Electronically Signed   By: Margarette Canada M.D.   On: 04/15/2016 14:08   Ct Angio Chest Pe W And/or Wo Contrast  Result  Date: 04/15/2016 CLINICAL DATA:  Flu-like symptoms. Knees, nausea, diarrhea and cough. Generalized body aches 1 week. EXAM: CT ANGIOGRAPHY CHEST WITH CONTRAST TECHNIQUE: Multidetector CT imaging of the chest was performed using the standard protocol during bolus administration of intravenous contrast. Multiplanar CT image reconstructions and MIPs were obtained to evaluate the vascular anatomy. CONTRAST:  80 mL Isovue 370 IV COMPARISON:  11/03/2015 and 06/05/2012 FINDINGS: Cardiovascular: Heart is normal in size. Calcified plaque over the left anterior descending coronary artery. Calcified plaque over the thoracic aorta. No evidence of pulmonary embolism. Mediastinum/Nodes: There is no evidence of hilar or mediastinal adenopathy. Remaining mediastinal structures are within normal. Lungs/Pleura: Lungs are adequately inflated and demonstrate patchy airspace opacification over the right lower lobe likely pneumonia. Minimal density over the medial right middle lobe likely atelectasis. Also minimal bibasilar dependent atelectasis. No  evidence of pleural effusion. Airways are within normal. Upper Abdomen: Calcified plaque over the abdominal aorta. Musculoskeletal: Mild degenerate change of the spine. Review of the MIP images confirms the above findings. IMPRESSION: No evidence of pulmonary embolism. Patchy airspace process over the right lower lobe likely a pneumonia. Atherosclerotic coronary artery disease.  Aortic atherosclerosis. Electronically Signed   By: Marin Olp M.D.   On: 04/15/2016 19:17   Dg Knee Complete 4 Views Left  Result Date: 04/16/2016 CLINICAL DATA:  Left knee pain without recent injury. EXAM: LEFT KNEE - COMPLETE 4+ VIEW COMPARISON:  None. FINDINGS: No evidence of fracture, dislocation, or joint effusion. Moderate narrowing of medial joint space is noted. Chondrocalcinosis is noted medially and laterally. Soft tissues are unremarkable. IMPRESSION: Moderate degenerative joint disease. No acute  abnormality seen in the left knee. Electronically Signed   By: Marijo Conception, M.D.   On: 04/16/2016 09:28        Scheduled Meds: . acidophilus  1 capsule Oral Daily  . cholecalciferol  1,000 Units Oral Daily  . cyproheptadine  8 mg Oral QHS  . famotidine  40 mg Oral QHS  . fluticasone  2 spray Each Nare Daily  . gabapentin  600 mg Oral Daily  . heparin  5,000 Units Subcutaneous Q8H  . [START ON 04/17/2016] levofloxacin (LEVAQUIN) IV  750 mg Intravenous Q48H  . lipase/protease/amylase  12,000 Units Oral TID  . loratadine  10 mg Oral Daily  . methylPREDNISolone (SOLU-MEDROL) injection  60 mg Intravenous Q8H  . mirtazapine  30 mg Oral QHS  . mometasone-formoterol  2 puff Inhalation BID  . montelukast  10 mg Oral QHS  . oseltamivir  30 mg Oral Daily  . pantoprazole  40 mg Oral Daily  . pravastatin  20 mg Oral Daily  . propylthiouracil  50 mg Oral Daily   Continuous Infusions:   LOS: 1 day     Cordelia Poche, MD [facility] 04/16/2016, 10:54 AM Pager: [phone]  If 7PM-7AM, please contact night-coverage [email or web address] Password [facility] 04/16/2016, 10:54 AM

## 2016-04-17 LAB — HIV ANTIBODY (ROUTINE TESTING W REFLEX): HIV Screen 4th Generation wRfx: NONREACTIVE

## 2016-04-17 MED ORDER — GUAIFENESIN-DM 100-10 MG/5ML PO SYRP: 10.0000 mL | ORAL_SOLUTION | ORAL | 0 refills | Status: DC | PRN

## 2016-04-17 MED ORDER — ALBUTEROL SULFATE (2.5 MG/3ML) 0.083% IN NEBU: 2.5000 mg | INHALATION_SOLUTION | RESPIRATORY_TRACT | 0 refills | Status: DC | PRN

## 2016-04-17 MED ORDER — PREDNISONE 50 MG PO TABS: 50.0000 mg | ORAL_TABLET | Freq: Every day | ORAL | 0 refills | Status: AC

## 2016-04-17 MED ORDER — OSELTAMIVIR PHOSPHATE 30 MG PO CAPS: 30.0000 mg | ORAL_CAPSULE | Freq: Two times a day (BID) | ORAL | 0 refills | Status: AC

## 2016-04-17 MED ORDER — OSELTAMIVIR PHOSPHATE 30 MG PO CAPS
30.0000 mg | ORAL_CAPSULE | Freq: Two times a day (BID) | ORAL | Status: DC
  Administered 2016-04-17: 30 mg via ORAL
  Filled 2016-04-17: qty 1

## 2016-04-17 MED ORDER — LEVOFLOXACIN 750 MG PO TABS: 750.0000 mg | ORAL_TABLET | ORAL | 0 refills | Status: AC

## 2016-04-17 MED ORDER — DICLOFENAC SODIUM 1 % TD GEL: 2.0000 g | Freq: Four times a day (QID) | TRANSDERMAL | 0 refills | Status: DC

## 2016-04-17 NOTE — Discharge Summary (Signed)
Physician Discharge Summary  Ebony Scott [id number] DOB: 05-06-1950 DOA: 04/15/2016  PCP: Odette Fraction, MD  Admit date: 04/15/2016 Discharge date: 04/17/2016  Admitted From: Home Disposition: Home  Recommendations for Outpatient Follow-up:  1. Follow up with PCP in 1 week 2. Follow-up left knee pain 3. Please follow up on the following pending results: Blood culture  Home Health: Physical therapy Equipment/Devices: Nebulizer  Discharge Condition: Stable CODE STATUS: Full code   Brief/Interim Summary:  Admission HPI written by Mitzi Hansen, MD   Chief Complaint: Cough, dyspnea, fever, gen weakness, aches  HPI: Ebony Scott is a 66 y.o. female with medical history significant for severe persistent asthma with strong atopic component, chronic diastolic CHF, hyperthyroidism, and GERD who presents the emergency department with fevers, cough, generalized weakness, nausea, body aches, and exertional dyspnea. Patient reports that she developed fevers, chills, and upper respiratory symptoms approximately one week ago. This was accompanied by a generalized malaise and aches. Over the ensuing week, the aches and fevers have improved minimally, but her dyspnea, weakness, and cough has progressively worsened. She discussed this with her pulmonologist by phone and was prescribed a Z-Pak which she completed last night. She has not noted any significant improvement with the antibiotic. Patient denies any chest pain or palpitations and denies any lower extremity swelling or tenderness. She denies any headache, change in vision or hearing, loss of coordination, or focal numbness or weakness. Site from azithromycin, the patient has also tried Mucinex and her inhalers without significant improvement.  ED Course: Upon arrival to the ED, patient is found to be afebrile, saturating adequately on room air, with soft blood pressure, and vitals otherwise stable. Patient was dyspneic and working to  breathe. Chest x-ray was negative for acute cardiopulmonary disease and chemistry panels notable for a serum creatinine 1.44, up from a baseline of roughly 1.2. CBC features a mild leukocytosis to 11,300. Lactic acid is reassuring at 1.20, troponin is within the normal limits, BNP is 15, and urine is unremarkable. Influenza PCR is positive for flu B. d-dimer is elevated to 2.07. CT AP study was obtained and notable for patchy airspace disease in the right lower lobe concerning for pneumonia, but negative for PE. Cultures were obtained in the ED, 1 L of normal saline was given, duo nebs were administered, and she was also treated with 125 mg IV Solu-Medrol. Antibiotics were started as well. Blood pressure improved with the IV fluids and the patient continued to oxygenate adequately, but was significantly dyspneic with minimal exertion and demonstrates increased work of breathing while at rest. She will be admitted to the medical/surgical unit on evaluation and management of community-acquired pneumonia, positive for influenza PCR, and resulting exacerbation and her asthma.   Hospital course:  Community acquired pneumonia Right lower lobe. On room air. Started on Levaquin for treatment. This was dosed renally. Robitussin for symptomatic management. Afebrile. To continue advise regimen as an outpatient.  Influenza infection Symptoms started at least one week ago, however, since patient was admitted for this presentation, Tamiflu started. This was renally adjusted.  Asthma with acute exacerbation Significantly improved with albuterol treatments and steroids. Patient is still not quite at baseline. No wheezing on exam. Continued the liver. Wrote prescription for nebulizer discharge with albuterol vials.  Left knee pain Patient had issues barely on this knee secondary to pain. Knee x-ray was significant for moderate amounts of arthritis. Exam only significant for medial joint tenderness. Patient started  on Voltaren gel with improvement in symptoms.  PT evaluated patient and recommended home health physical therapy  Hyperlipidemia Continued pravastatin  CKD stage III Creatinine improved with IV fluids. 1.44 and admission down to 1.02 before discharge  GERD Continued H2-blocker and PPI  Hyperthyroidism Stable. Continued PTU  Discharge Diagnoses:  Principal Problem:   Pneumonia Active Problems:   Hyperthyroidism   Severe asthma with acute exacerbation   GERD   CAP (community acquired pneumonia)   Chronic diastolic CHF (congestive heart failure) ([place])   Influenza B   CKD (chronic kidney disease), stage III    Discharge Instructions   Allergies as of 04/17/2016      Reactions   Dust Mite Extract Shortness Of Breath, Other (See Comments)   "sneezing" (02/20/2012)   Molds & Smuts Shortness Of Breath   Morphine Sulfate Itching   Other Shortness Of Breath, Other (See Comments)   Grass and weeds "sneezing; filled sinuses" (02/20/2012)   Penicillins Rash, Other (See Comments)   "welts" (02/20/2012)   Reclast [zoledronic Acid]    Put in hospital   Rofecoxib Swelling   Celebrex REACTION: feet swelling   Shrimp Flavor Anaphylaxis   ALL SHELLFISH   Tetracycline Hcl Nausea And Vomiting   Dilaudid [hydromorphone Hcl] Itching   Hydrocodone-acetaminophen Nausea And Vomiting   Levofloxacin Other (See Comments)   REACTION: GI upset   Oxycodone Hcl Nausea And Vomiting   Paroxetine Nausea And Vomiting   Paxil   Diltiazem Swelling   Tree Extract Other (See Comments)   "tested and told I was allergic to it; never experienced a reaction to it" (02/20/2012)      Medication List    STOP taking these medications   MUCINEX PO   TYLENOL ARTHRITIS PAIN 650 MG CR tablet Generic drug:  acetaminophen     TAKE these medications   AEROCHAMBER MV inhaler Use as instructed   albuterol (2.5 MG/3ML) 0.083% nebulizer solution Commonly known as:  PROVENTIL Take 3 mLs (2.5 mg total) by  nebulization every 4 (four) hours as needed for wheezing or shortness of breath.   ALIGN 4 MG Caps Take 1 tablet by mouth daily. Once a day   Alpha-Lipoic Acid 600 MG Caps Take 600 mg by mouth daily. Once a day   azelastine 0.1 % nasal spray Commonly known as:  ASTELIN USE 2 SPRAYS IN EACH NOSTRIL DAILY AS DIRECTED   BEANO Tabs Take 1 tablet by mouth daily.   cetirizine 10 MG tablet Commonly known as:  ZYRTEC Take 1/2 tablet daily   colchicine 0.6 MG tablet TAKE 1 TABLET BY MOUTH DAILY   cyproheptadine 4 MG tablet Commonly known as:  PERIACTIN Take two tablets every evening   denosumab 60 MG/ML Soln injection Commonly known as:  PROLIA Inject 60 mg into the skin every 6 (six) months. Administer in upper arm, thigh, or abdomen   dexlansoprazole 60 MG capsule Commonly known as:  DEXILANT Take 1 capsule (60 mg total) by mouth daily.   diclofenac sodium 1 % Gel Commonly known as:  VOLTAREN Apply 2 g topically 4 (four) times daily.   fluconazole 150 MG tablet Commonly known as:  DIFLUCAN Take 1 tablet (150 mg total) by mouth daily.   FLUTTER Devi Use as directed   gabapentin 600 MG tablet Commonly known as:  NEURONTIN Take 1 tablet (600 mg total) by mouth daily.   GAS-X EXTRA STRENGTH 125 MG Caps Generic drug:  Simethicone Take 2 each by mouth daily as needed. For gas relief   guaiFENesin-dextromethorphan 100-10 MG/5ML syrup  Commonly known as:  ROBITUSSIN DM Take 10 mLs by mouth every 4 (four) hours as needed for cough.   lactase 3000 units tablet Commonly known as:  LACTAID Take 3 tablets by mouth as needed. 3-4 tabs with dairy liquids and solids   levofloxacin 750 MG tablet Commonly known as:  LEVAQUIN Take 1 tablet (750 mg total) by mouth every other day.   lipase/protease/amylase 12000 units Cpep capsule Commonly known as:  CREON Take 1 capsule (12,000 Units total) by mouth 3 (three) times daily. CREON 24000-76000-120000 U Takes with meals    mirtazapine 30 MG disintegrating tablet Commonly known as:  REMERON SOLTAB Take 1 tablet (30 mg total) by mouth at bedtime.   mometasone 50 MCG/ACT nasal spray Commonly known as:  NASONEX Place 2 sprays into the nose 2 (two) times daily.   mometasone-formoterol 200-5 MCG/ACT Aero Commonly known as:  DULERA Inhale 2 puffs into the lungs 2 (two) times daily.   montelukast 10 MG tablet Commonly known as:  SINGULAIR TAKE 1 TABLET BY MOUTH ONCE DAILY   oseltamivir 30 MG capsule Commonly known as:  TAMIFLU Take 1 capsule (30 mg total) by mouth 2 (two) times daily.   pravastatin 20 MG tablet Commonly known as:  PRAVACHOL Take 1 tablet (20 mg total) by mouth daily.   predniSONE 50 MG tablet Commonly known as:  DELTASONE Take 1 tablet (50 mg total) by mouth daily with breakfast. Start taking on:  04/18/2016   PRESCRIPTION MEDICATION Place 1 capsule into both ears daily as needed. Pt has compounded capsule (powder) that she uses In her ears as needed;  CHLORAMP/SULFU/AMPHO B/ H DROC 100-100-5 mg capsule   propylthiouracil 50 MG tablet Commonly known as:  PTU take 1/2 tablet (25 mg) twice daily What changed:  how much to take  how to take this  when to take this  additional instructions   ranitidine 300 MG tablet Commonly known as:  ZANTAC Take 1 tablet (300 mg total) by mouth daily.   SOOTHE OP Apply to eye as needed.   VINATE M 27-1 MG Tabs Take 1 tablet by mouth daily.   Vitamin D3 2000 units capsule 1/2 at bedtime   XOLAIR 150 MG injection Generic drug:  omalizumab Inject 150 mg into the skin every 28 (twenty-eight) days. 150 SQ every 4 weeks            Durable Medical Equipment        Start     Ordered   04/17/16 1403  For home use only DME Nebulizer machine  Once    Question:  Patient needs a nebulizer to treat with the following condition  Answer:  Asthma   04/17/16 1403     Follow-up Information    [facility] Follow up.    Contact information: [place] [address] [place], [place]. Schedule an appointment as soon as possible for a visit in 1 week(s).   Specialty:  Family Medicine Contact information: [id number] [place] [address] [phone]          Allergies  Allergen Reactions  . Dust Mite Extract Shortness Of Breath and Other (See Comments)    "sneezing" (02/20/2012)  . Molds & Smuts Shortness Of Breath  . Morphine Sulfate Itching  . Other Shortness Of Breath and Other (See Comments)    Grass and weeds "sneezing; filled sinuses" (02/20/2012)  . Penicillins Rash and  Other (See Comments)    "welts" (02/20/2012)  . Reclast [Zoledronic Acid]     Put in hospital  . Rofecoxib Swelling    Celebrex REACTION: feet swelling  . Shrimp Flavor Anaphylaxis    ALL SHELLFISH  . Tetracycline Hcl Nausea And Vomiting  . Dilaudid [Hydromorphone Hcl] Itching  . Hydrocodone-Acetaminophen Nausea And Vomiting  . Levofloxacin Other (See Comments)    REACTION: GI upset  . Oxycodone Hcl Nausea And Vomiting  . Paroxetine Nausea And Vomiting    Paxil   . Diltiazem Swelling  . Tree Extract Other (See Comments)    "tested and told I was allergic to it; never experienced a reaction to it" (02/20/2012)    Consultations:  None   Procedures/Studies: Dg Chest 2 View  Result Date: 04/15/2016 CLINICAL DATA:  Cough, congestion and body aches for 1 week. EXAM: CHEST  2 VIEW COMPARISON:  02/09/2016 and prior chest radiographs FINDINGS: The cardiomediastinal silhouette is unremarkable. There is no evidence of focal airspace disease, pulmonary edema, suspicious pulmonary nodule/mass, pleural effusion, or pneumothorax. No acute bony abnormalities are identified. Surgical clips overlying the left breast and evidence of cervical spine fusion noted. IMPRESSION: No evidence of acute cardiopulmonary disease. Electronically Signed   By: Margarette Canada M.D.   On:  04/15/2016 14:08   Ct Angio Chest Pe W And/or Wo Contrast  Result Date: 04/15/2016 CLINICAL DATA:  Flu-like symptoms. Knees, nausea, diarrhea and cough. Generalized body aches 1 week. EXAM: CT ANGIOGRAPHY CHEST WITH CONTRAST TECHNIQUE: Multidetector CT imaging of the chest was performed using the standard protocol during bolus administration of intravenous contrast. Multiplanar CT image reconstructions and MIPs were obtained to evaluate the vascular anatomy. CONTRAST:  80 mL Isovue 370 IV COMPARISON:  11/03/2015 and 06/05/2012 FINDINGS: Cardiovascular: Heart is normal in size. Calcified plaque over the left anterior descending coronary artery. Calcified plaque over the thoracic aorta. No evidence of pulmonary embolism. Mediastinum/Nodes: There is no evidence of hilar or mediastinal adenopathy. Remaining mediastinal structures are within normal. Lungs/Pleura: Lungs are adequately inflated and demonstrate patchy airspace opacification over the right lower lobe likely pneumonia. Minimal density over the medial right middle lobe likely atelectasis. Also minimal bibasilar dependent atelectasis. No evidence of pleural effusion. Airways are within normal. Upper Abdomen: Calcified plaque over the abdominal aorta. Musculoskeletal: Mild degenerate change of the spine. Review of the MIP images confirms the above findings. IMPRESSION: No evidence of pulmonary embolism. Patchy airspace process over the right lower lobe likely a pneumonia. Atherosclerotic coronary artery disease.  Aortic atherosclerosis. Electronically Signed   By: Marin Olp M.D.   On: 04/15/2016 19:17   Dg Knee Complete 4 Views Left  Result Date: 04/16/2016 CLINICAL DATA:  Left knee pain without recent injury. EXAM: LEFT KNEE - COMPLETE 4+ VIEW COMPARISON:  None. FINDINGS: No evidence of fracture, dislocation, or joint effusion. Moderate narrowing of medial joint space is noted. Chondrocalcinosis is noted medially and laterally. Soft tissues are  unremarkable. IMPRESSION: Moderate degenerative joint disease. No acute abnormality seen in the left knee. Electronically Signed   By: Marijo Conception, M.D.   On: 04/16/2016 09:28        Subjective: Patient reports improvement in cough. Still productive. No dyspnea at rest.  Discharge Exam: Vitals:   04/17/16 0624 04/17/16 1013  BP: (!) 147/74   Pulse: 90 78  Resp: 18 18  Temp: 98 F (36.7 C)    Vitals:   04/16/16 2153 04/16/16 2239 04/17/16 0624 04/17/16 1013  BP: 124/69  Marland Kitchen)  147/74   Pulse: 80  90 78  Resp: 16  18 18   Temp: 98.1 F (36.7 C)  98 F (36.7 C)   TempSrc: Oral  Oral   SpO2: 96% 97% 97% 96%  Weight:      Height:        General exam: Appears calm and comfortable Respiratory system: Clear to auscultation. Respiratory effort normal. Cardiovascular system: S1 & S2 heard, RRR. No murmurs, rubs, gallops or clicks. Gastrointestinal system: Abdomen is nondistended, soft and nontender. Normal bowel sounds heard. Central nervous system: Alert and oriented. No focal neurological deficits. Extremities: No edema. No calf tenderness. Some medial joint tenderness of left knee. No laxity. Skin: No cyanosis. No rashes Psychiatry: Judgement and insight appear normal. Mood & affect appropriate.   The results of significant diagnostics from this hospitalization (including imaging, microbiology, ancillary and laboratory) are listed below for reference.     Microbiology: Recent Results (from the past 240 hour(s))  Blood culture (routine x 2)     Status: None (Preliminary result)   Collection Time: 04/15/16  4:55 PM  Result Value Ref Range Status   Specimen Description BLOOD LEFT ARM  Final   Special Requests BOTTLES DRAWN AEROBIC AND ANAEROBIC 5CC EA  Final   Culture   Final    NO GROWTH 2 DAYS Performed at [facility], [address], [place], [place] [address]    Report Status PENDING  Incomplete  Blood culture (routine x 2)     Status: None (Preliminary  result)   Collection Time: 04/15/16  5:00 PM  Result Value Ref Range Status   Specimen Description BLOOD RIGHT FOREARM  Final   Special Requests IN PEDIATRIC BOTTLE 3CC  Final   Culture   Final    NO GROWTH 2 DAYS Performed at [facility], [place] [address], [place], [place] [address]    Report Status PENDING  Incomplete     Labs: BNP (last 3 results)  Recent Labs  04/15/16 1700  BNP [id number]   Basic Metabolic Panel:  Recent Labs Lab 04/15/16 1652 04/16/16 0935  NA 138 140  K 3.7 3.7  CL 106 113*  CO2 22 19*  GLUCOSE 116* 168*  BUN 21* 17  CREATININE 1.44* 1.02*  CALCIUM 8.5* 7.4*  MG 1.9  --    Liver Function Tests:  Recent Labs Lab 04/15/16 1652  AST 35  ALT 30  ALKPHOS 48  BILITOT 0.5  PROT 8.1  ALBUMIN 4.0   CBC:  Recent Labs Lab 04/15/16 1700  WBC 11.3*  NEUTROABS 8.5*  HGB 14.0  HCT 40.8  MCV 96.2  PLT 274   D-Dimer  Recent Labs  04/15/16 1652  DDIMER 2.07*   Urinalysis    Component Value Date/Time   COLORURINE YELLOW 04/15/2016 1919   APPEARANCEUR CLEAR 04/15/2016 1919   LABSPEC 1.017 04/15/2016 1919   PHURINE 6.0 04/15/2016 [place] NEGATIVE 04/15/2016 [place] NEGATIVE 04/15/2016 [place] NEGATIVE 04/15/2016 1919   KETONESUR 5 (A) 04/15/2016 1919   PROTEINUR NEGATIVE 04/15/2016 1919   UROBILINOGEN 0.2 05/17/2014 1113   NITRITE NEGATIVE 04/15/2016 1919   LEUKOCYTESUR NEGATIVE 04/15/2016 1919   Microbiology Recent Results (from the past 240 hour(s))  Blood culture (routine x 2)     Status: None (Preliminary result)   Collection Time: 04/15/16  4:55 PM  Result Value Ref Range Status   Specimen Description BLOOD LEFT ARM  Final   Special  Requests BOTTLES DRAWN AEROBIC AND ANAEROBIC 5CC EA  Final   Culture   Final    NO GROWTH 2 DAYS Performed at [facility], [place] [address], [place], [place] [address]    Report Status PENDING  Incomplete  Blood culture (routine x 2)     Status: None  (Preliminary result)   Collection Time: 04/15/16  5:00 PM  Result Value Ref Range Status   Specimen Description BLOOD RIGHT FOREARM  Final   Special Requests IN PEDIATRIC BOTTLE 3CC  Final   Culture   Final    NO GROWTH 2 DAYS Performed at [facility], [place] [address] [place], [place] [address]    Report Status PENDING  Incomplete     Time coordinating discharge: Over 30 minutes  SIGNED:   Cordelia Poche, MD [facility] 04/17/2016, 2:25 PM Pager [phone]  If 7PM-7AM, please contact night-coverage [email or web address] Password [email or web address]

## 2016-04-17 NOTE — Evaluation (Signed)
Occupational Therapy Evaluation Patient Details Name: Ebony Scott MRN: [id number] DOB: 23-Mar-1950 Today's Date: 04/17/2016    History of Present Illness Ebony Scott is a 66 y.o. female with medical history significant for severe persistent asthma with strong atopic component, chronic diastolic CHF, hyperthyroidism, and GERD who presents the emergency department with fevers, cough, generalized weakness, nausea, body aches, and exertional dyspnea, +flu B   Clinical Impression   OT eval and education complete. Pt has all needed DME     Follow Up Recommendations  No OT follow up;Supervision - Intermittent    Equipment Recommendations  None recommended by OT    Recommendations for Other Services       Precautions / Restrictions Precautions Precautions: Fall Restrictions Weight Bearing Restrictions: No      Mobility Bed Mobility Overal bed mobility: Modified Independent                Transfers Overall transfer level: Needs assistance   Transfers: Sit to/from Stand Sit to Stand: Supervision         General transfer comment: for safety    Balance Overall balance assessment: Needs assistance   Sitting balance-Leahy Scale: Good       Standing balance-Leahy Scale: Fair Standing balance comment: pt is very unsteady with wt shifting, LOB when reaching outside BOS but independent although delayed recovery             High level balance activites: Turns;Head turns High Level Balance Comments: min/guard for safety            ADL Overall ADL's : Needs assistance/impaired                     Lower Body Dressing: Supervision/safety;Sit to/from stand;Cueing for safety;Cueing for sequencing;Cueing for compensatory techniques   Toilet Transfer: Supervision/safety;RW;Ambulation;Cueing for sequencing;Cueing for safety   Toileting- Clothing Manipulation and Hygiene: Supervision/safety;Sit to/from stand;Cueing for safety;Cueing for sequencing          General ADL Comments: Pt overall S with ADL activity . OT educated pt on energy conservation strategies including taking breaks during ADL activity. Pt verbalized understanding.                    Pertinent Vitals/Pain Pain Assessment: 0-10 Pain Score: 3  Pain Location: with coughing Pain Descriptors / Indicators: Discomfort Pain Intervention(s): Monitored during session        Extremity/Trunk Assessment Upper Extremity Assessment Upper Extremity Assessment: Generalized weakness   Lower Extremity Assessment Lower Extremity Assessment: Generalized weakness   Cervical / Trunk Assessment Cervical / Trunk Assessment: Kyphotic   Communication Communication Communication: No difficulties   Cognition Arousal/Alertness: Awake/alert Behavior During Therapy: WFL for tasks assessed/performed Overall Cognitive Status: Within Functional Limits for tasks assessed                                Home Living Family/patient expects to be discharged to:: Private residence Living Arrangements: Alone   Type of Home: House Home Access: Stairs to enter Technical brewer of Steps: 14   Home Layout: Two level;Laundry or work area in Building surveyor of Steps: 12 Alternate Level Stairs-Rails: Right Bathroom Shower/Tub: Occupational psychologist: Handicapped height     Home Equipment: Nurse, children's - 2 wheels;Shower seat - built in          Prior Functioning/Environment Level of Independence: Independent  OT Goals(Current goals can be found in the care plan section) Acute Rehab OT Goals Patient Stated Goal: home soon, bulid back up to normal Time For Goal Achievement: 05/01/16  OT Frequency:                End of Session Equipment Utilized During Treatment: Rolling walker Nurse Communication: Mobility status  Activity Tolerance: Patient limited by fatigue Patient left: in  chair;with call bell/phone within reach  OT Visit Diagnosis: Unsteadiness on feet (R26.81)                ADL either performed or assessed with clinical judgement  Time: 1350-1412 OT Time Calculation (min): 22 min Charges:  OT General Charges $OT Visit: 1 Procedure OT Evaluation $OT Eval Moderate Complexity: 1 Procedure G-Codes:     Kari Baars, [employer]  Payton Mccallum D 04/17/2016, 2:26 PM

## 2016-04-17 NOTE — Care Management Note (Signed)
Case Management Note  Patient Details  Name: KAREM WEEDMAN MRN: [id number] Date of Birth: 1950-06-25  Subjective/Objective:     66 yo admitted with PNA               Action/Plan: Pt from home alone. PT recommendations reviewed with pt and choice offered for HHPT services. [facility] was chosen and [facility] rep contacted for referral. Order received. No other DC needs communicated.  Expected Discharge Date:                  Expected Discharge Plan:  [place]  In-House Referral:     Discharge planning Services  CM Consult  Post Acute Care Choice:  Home Health Choice offered to:  Patient  DME Arranged:    DME Agency:     HH Arranged:  PT [place]:  [place]  Status of Service:  In process, will continue to follow  If discussed at Long Length of Stay Meetings, dates discussed:    Additional CommentsLynnell Catalan, RN 04/17/2016, 1:14 PM  [phone]

## 2016-04-17 NOTE — Evaluation (Signed)
Physical Therapy Evaluation Patient Details Name: Ebony Scott MRN: [id number] DOB: August 07, 1950 Today's Date: 04/17/2016   History of Present Illness  Ebony Scott is a 66 y.o. female with medical history significant for severe persistent asthma with strong atopic component, chronic diastolic CHF, hyperthyroidism, and GERD who presents the emergency department with fevers, cough, generalized weakness, nausea, body aches, and exertional dyspnea, +flu B  Clinical Impression  Patient evaluated by Physical Therapy with no further acute PT needs identified. All education has been completed and the patient has no further questions. * See below for any follow-up Physical Therapy or equipment needs. PT is signing off. Thank you for this referral.  Pt is quite dyspneic today with minimal exertion; pt reports her son will be there to help with laundry and to help get "settled"; recommend pt use her RW for added gait stability and pt verbalizes agreement with this; also recommend HHPT at D/C; RN aware     Follow Up Recommendations Home health PT    Equipment Recommendations  None recommended by PT    Recommendations for Other Services       Precautions / Restrictions Precautions Precautions: Fall Restrictions Weight Bearing Restrictions: No      Mobility  Bed Mobility Overal bed mobility: Modified Independent                Transfers Overall transfer level: Needs assistance   Transfers: Sit to/from Stand Sit to Stand: Supervision         General transfer comment: for safety  Ambulation/Gait Ambulation/Gait assistance: Supervision Ambulation Distance (Feet): 50 Feet Assistive device: None;1 person hand held assist Gait Pattern/deviations: Step-through pattern;Narrow base of support;Drifts right/left;Trunk flexed     General Gait Details: cues for posture  Stairs            Wheelchair Mobility    Modified Rankin (Stroke Patients Only)       Balance Overall  balance assessment: Needs assistance   Sitting balance-Leahy Scale: Good       Standing balance-Leahy Scale: Fair Standing balance comment: pt is very unsteady with wt shifting, LOB when reaching outside BOS but independent although delayed recovery             High level balance activites: Turns;Head turns High Level Balance Comments: min/guard for safety             Pertinent Vitals/Pain Pain Assessment: 0-10 Pain Score: 2  Pain Location: with coughing Pain Descriptors / Indicators: Discomfort Pain Intervention(s): Monitored during session    Home Living Family/patient expects to be discharged to:: Private residence Living Arrangements: Alone   Type of Home: House Home Access: Stairs to enter   Technical brewer of Steps: 14 Home Layout: Two level;Laundry or work area in [place]: Nurse, children's - 2 wheels;Shower seat - built in      Prior Function Level of Independence: Independent               Journalist, newspaper        Extremity/Trunk Assessment   Upper Extremity Assessment Upper Extremity Assessment: Generalized weakness    Lower Extremity Assessment Lower Extremity Assessment: Generalized weakness    Cervical / Trunk Assessment Cervical / Trunk Assessment: Kyphotic  Communication   Communication: No difficulties  Cognition Arousal/Alertness: Awake/alert Behavior During Therapy: WFL for tasks assessed/performed Overall Cognitive Status: Within Functional Limits for tasks assessed  General Comments      Exercises     Assessment/Plan    PT Assessment All further PT needs can be met in the next venue of care  PT Problem List         PT Treatment Interventions      PT Goals (Current goals can be found in the Care Plan section)  Acute Rehab PT Goals Patient Stated Goal: home soon, bulid back up to normal PT Goal Formulation: All assessment and education complete, DC  therapy    Frequency     Barriers to discharge        Co-evaluation               End of Session Equipment Utilized During Treatment: Gait belt Activity Tolerance: Patient tolerated treatment well;Patient limited by fatigue Patient left: with call bell/phone within reach;in bed   PT Visit Diagnosis: Unsteadiness on feet (R26.81)         Time: 8350-7573 PT Time Calculation (min) (ACUTE ONLY): 20 min   Charges:   PT Evaluation $PT Eval Low Complexity: 1 Procedure     PT G Codes:         Ewart Carrera May 03, 2016, 10:51 AM

## 2016-04-17 NOTE — Progress Notes (Signed)
PHARMACY NOTE:  ANTIMICROBIAL RENAL DOSAGE ADJUSTMENT  Current antimicrobial regimen includes a mismatch between antimicrobial dosage and estimated renal function.  As per policy approved by the Pharmacy & Therapeutics and Medical Executive Committees, the antimicrobial dosage will be adjusted accordingly.  Current antimicrobial dosage: Tamfilu 30mg  PO BID  Indication: +Flu  Renal Function:  Estimated Creatinine Clearance: 39.8 mL/min (by C-G formula based on SCr of 1.02 mg/dL (H)). []      On intermittent HD, scheduled: []      On CRRT    Antimicrobial dosage has been changed to: Tamiflu changed to 30mg  for CrCl 30-60 ml/min  Additional comments:   Thank you for allowing pharmacy to be a part of this patient's care.  Kara Mead, [facility] 04/17/2016 8:37 AM

## 2016-04-17 NOTE — Discharge Instructions (Signed)
[name],  Your admitted to the hospital for management of your influenza and asthma exacerbation. Given treatment for this infection with Tamiflu. He will continue this as an outpatient. You were also given steroids and breathing treatments to help with her asthma. Physical therapy evaluate you and recommended home health physical therapy. Please follow-up with her primary care physician

## 2016-04-18 ENCOUNTER — Encounter: Payer: Self-pay | Admitting: *Deleted

## 2016-04-20 LAB — CULTURE, BLOOD (ROUTINE X 2)
Culture: NO GROWTH
Culture: NO GROWTH

## 2016-04-25 ENCOUNTER — Encounter: Payer: Self-pay | Admitting: Physician Assistant

## 2016-04-25 ENCOUNTER — Ambulatory Visit (INDEPENDENT_AMBULATORY_CARE_PROVIDER_SITE_OTHER): Payer: Medicare Other | Admitting: Physician Assistant

## 2016-04-25 VITALS — BP 130/70 | HR 80 | Temp 97.8°F | Resp 14 | Wt 125.0 lb

## 2016-04-25 DIAGNOSIS — J189 Pneumonia, unspecified organism: Secondary | ICD-10-CM

## 2016-04-25 DIAGNOSIS — Z09 Encounter for follow-up examination after completed treatment for conditions other than malignant neoplasm: Secondary | ICD-10-CM | POA: Diagnosis not present
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
## 2016-04-25 MED ORDER — HYDROCORTISONE 2.5 % RE CREA: 1.0000 "application " | TOPICAL_CREAM | Freq: Two times a day (BID) | RECTAL | 5 refills | Status: DC

## 2016-04-25 MED ORDER — COLCHICINE 0.6 MG PO TABS: 0.6000 mg | ORAL_TABLET | Freq: Two times a day (BID) | ORAL | 1 refills | Status: DC

## 2016-04-25 NOTE — Progress Notes (Signed)
Patient ID: LEVI CRASS MRN: [id number], DOB: [birth year], 66 y.o. Date of Encounter: @DATE @  Chief Complaint:  Chief Complaint  Patient presents with  . Hospitalization Follow-up    pneumonia/flu    HPI: 66 y.o. year old female  presents for f/u after recent hospitalization.   The following is copied from her hospital discharge summary: I have reviewed the following information:  Admit date: 04/15/2016 Discharge date: 04/17/2016  Admitted From: Home Disposition: Home  Recommendations for Outpatient Follow-up:  1. Follow up with PCP in 1 week 2. Follow-up left knee pain 3. Please follow up on the following pending results: Blood culture  Home Health: Physical therapy Equipment/Devices: Nebulizer  Discharge Condition: Stable CODE STATUS: Full code   Brief/Interim Summary:  Admission HPI written by Mitzi Hansen, MD   Chief Complaint: Cough, dyspnea, fever, gen weakness, aches  HPI: Ivyana Locey Reidis a 66 y.o.femalewith medical history significant for severe persistent asthma with strong atopic component, chronic diastolic CHF, hyperthyroidism, and GERD who presents the emergency department with fevers, cough, generalized weakness, nausea, body aches, and exertional dyspnea. Patient reports that she developed fevers, chills, and upper respiratory symptoms approximately one week ago. This was accompanied by a generalized malaise and aches. Over the ensuing week, the aches and fevers have improved minimally, but her dyspnea, weakness, and cough has progressively worsened. She discussed this with her pulmonologist by phone and was prescribed a Z-Pak which she completed last night. She has not noted any significant improvement with the antibiotic. Patient denies any chest pain or palpitations and denies any lower extremity swelling or tenderness. She denies any headache, change in vision or hearing, loss of coordination, or focal numbness or weakness. Site from  azithromycin, the patient has also tried Mucinex and her inhalers without significant improvement.  ED Course:Upon arrival to the ED, patient is found to be afebrile, saturating adequately on room air, with soft blood pressure, and vitals otherwise stable. Patient was dyspneic and working to breathe. Chest x-ray was negative for acute cardiopulmonary disease and chemistry panels notable for a serum creatinine 1.44, up from a baseline of roughly 1.2. CBC features a mild leukocytosis to 11,300. Lactic acid is reassuring at 1.20, troponin is within the normal limits, BNP is 15, and urine is unremarkable. Influenza PCR is positive for flu B. d-dimer is elevated to 2.07. CT AP study was obtained and notable for patchy airspace disease in the right lower lobe concerning for pneumonia, but negative for PE. Cultures were obtained in the ED, 1 L of normal saline was given, duo nebs were administered, and she was also treated with 125 mg IV Solu-Medrol. Antibiotics were started as well. Blood pressure improved with the IV fluids and the patient continued to oxygenate adequately, but was significantly dyspneic with minimal exertion and demonstrates increased work of breathing while at rest. She will be admitted to the medical/surgical unit on evaluation and management of community-acquired pneumonia, positive for influenza PCR, and resulting exacerbation and her asthma.  Hospital course:  Community acquired pneumonia Right lower lobe. On room air. Started on Levaquin for treatment. This was dosed renally. Robitussin for symptomatic management. Afebrile. To continue advise regimen as an outpatient.  Influenza infection Symptoms started at least one week ago, however, since patient was admitted for this presentation, Tamiflu started. This was renally adjusted.  Asthma with acute exacerbation Significantly improved with albuterol treatments and steroids. Patient is still not quite at baseline. No wheezing on  exam. Continued the liver. Wrote  prescription for nebulizer discharge with albuterol vials.  Left knee pain Patient had issues barely on this knee secondary to pain. Knee x-ray was significant for moderate amounts of arthritis. Exam only significant for medial joint tenderness. Patient started on Voltaren gel with improvement in symptoms. PT evaluated patient and recommended home health physical therapy  Hyperlipidemia Continued pravastatin  CKD stage III Creatinine improved with IV fluids. 1.44 and admission down to 1.02 before discharge  GERD Continued H2-blocker and PPI  Hyperthyroidism Stable. Continued PTU   TODAY----04/25/2016: Today patient states that she has taken all medications as directed at time of discharge home. She reports that she still has some mucus and phlegm but it is much better and significantly improving.  She brings in a note Book where she has written some questions to ask me. I have answered all of these questions.  She reports that she has noticed some swelling of her feet for me to look at. She reports that she has been feeling sore across her mid back and has been applying heat and using her Tylenol arthritis.  Also I did review the blood culture result which was pending at the time of discharge and this is negative.    Past Medical History:  Diagnosis Date  . Allergic rhinitis   . Allergy    SEASONAL  . Anemia   . Anxiety    pt denies  . Arthritis    "mostly the hands" (02/20/2012)  . Asthma   . Breast cancer ([place]) 1998   in remission  . Cataract    REMOVED  . Chronic lower back pain   . Chronic neck pain   . Complication of anesthesia    "had hard time waking up from it several times" (02/20/2012)  . Depression    "some; don't take anything for it" (02/20/2012)  . Diverticulosis   . DVT (deep venous thrombosis) ([place])   . Exertional dyspnea   . Fibromyalgia 11/2011  . GERD (gastroesophageal reflux disease)   . Graves disease   .  Headache(784.0)    "related to allergies; more at different times during the year" (02/20/2012)  . Hemorrhoids   . Hiatal hernia    back and neck  . Hx of adenomatous colonic polyps 04/12/2016  . Hypercholesteremia    good cholesterol is high  . Hypothyroidism   . IBS (irritable bowel syndrome)   . Moderate persistent asthma    -FeV1 72% 2011, -IgE 102 2011, CT sinus Neg 2011  . Osteoporosis    on reclast yearly  . Pneumonia 04/2011; ~ 11/2011   "double; single" (02/20/2012)     Home Meds: Outpatient Medications Prior to Visit  Medication Sig Dispense Refill  . albuterol (PROVENTIL) (2.5 MG/3ML) 0.083% nebulizer solution Take 3 mLs (2.5 mg total) by nebulization every 4 (four) hours as needed for wheezing or shortness of breath. 75 mL 0  . Alpha-D-Galactosidase (BEANO) TABS Take 1 tablet by mouth daily.     . Alpha-Lipoic Acid 600 MG CAPS Take 600 mg by mouth daily. Once a day    . azelastine (ASTELIN) 0.1 % nasal spray USE 2 SPRAYS IN EACH NOSTRIL DAILY AS DIRECTED 30 mL 5  . cetirizine (ZYRTEC) 10 MG tablet Take 1/2 tablet daily    . Cholecalciferol (VITAMIN D3) 2000 units capsule 1/2 at bedtime    . colchicine 0.6 MG tablet TAKE 1 TABLET BY MOUTH DAILY 90 tablet 1  . cyproheptadine (PERIACTIN) 4 MG tablet Take two tablets every evening  60 tablet 5  . denosumab (PROLIA) 60 MG/ML SOLN injection Inject 60 mg into the skin every 6 (six) months. Administer in upper arm, thigh, or abdomen    . dexlansoprazole (DEXILANT) 60 MG capsule Take 1 capsule (60 mg total) by mouth daily. 30 capsule 5  . diclofenac sodium (VOLTAREN) 1 % GEL Apply 2 g topically 4 (four) times daily. 1 Tube 0  . fluconazole (DIFLUCAN) 150 MG tablet Take 1 tablet (150 mg total) by mouth daily. 1 tablet 0  . gabapentin (NEURONTIN) 600 MG tablet Take 1 tablet (600 mg total) by mouth daily. 90 tablet 3  . guaiFENesin-dextromethorphan (ROBITUSSIN DM) 100-10 MG/5ML syrup Take 10 mLs by mouth every 4 (four) hours as needed for  cough. 118 mL 0  . lactase (LACTAID) 3000 UNITS tablet Take 3 tablets by mouth as needed. 3-4 tabs with dairy liquids and solids    . lipase/protease/amylase (CREON) 12000 units CPEP capsule Take 1 capsule (12,000 Units total) by mouth 3 (three) times daily. CREON 24000-76000-120000 U Takes with meals 270 capsule 5  . mirtazapine (REMERON SOLTAB) 30 MG disintegrating tablet Take 1 tablet (30 mg total) by mouth at bedtime. 30 tablet 11  . mometasone (NASONEX) 50 MCG/ACT nasal spray Place 2 sprays into the nose 2 (two) times daily. 17 g 0  . mometasone-formoterol (DULERA) 200-5 MCG/ACT AERO Inhale 2 puffs into the lungs 2 (two) times daily. 1 Inhaler 5  . montelukast (SINGULAIR) 10 MG tablet TAKE 1 TABLET BY MOUTH ONCE DAILY 30 tablet 11  . omalizumab (XOLAIR) 150 MG injection Inject 150 mg into the skin every 28 (twenty-eight) days. 150 SQ every 4 weeks    . pravastatin (PRAVACHOL) 20 MG tablet Take 1 tablet (20 mg total) by mouth daily. 90 tablet 1  . Prenatal Vit-Sel-Fe Fum-FA (VINATE M) 27-1 MG TABS Take 1 tablet by mouth daily. 30 each 11  . PRESCRIPTION MEDICATION Place 1 capsule into both ears daily as needed. Pt has compounded capsule (powder) that she uses In her ears as needed;  CHLORAMP/SULFU/AMPHO B/ H DROC 100-100-5 mg capsule    . Probiotic Product (ALIGN) 4 MG CAPS Take 1 tablet by mouth daily. Once a day    . Propylene Glycol-Glycerin (SOOTHE OP) Apply to eye as needed.    . propylthiouracil (PTU) 50 MG tablet take 1/2 tablet (25 mg) twice daily (Patient taking differently: Take 50 mg by mouth daily. take 1/2 tablet (25 mg) twice daily) 30 tablet 5  . ranitidine (ZANTAC) 300 MG tablet Take 1 tablet (300 mg total) by mouth daily. 30 tablet 5  . Respiratory Therapy Supplies (FLUTTER) DEVI Use as directed (Patient not taking: Reported on 03/15/2016) 1 each 0  . Simethicone (GAS-X EXTRA STRENGTH) 125 MG CAPS Take 2 each by mouth daily as needed. For gas relief    . Spacer/Aero-Holding  Chambers (AEROCHAMBER MV) inhaler Use as instructed 1 each 0   Facility-Administered Medications Prior to Visit  Medication Dose Route Frequency Provider Last Rate Last Dose  . omalizumab Arvid Right) injection 150 mg  150 mg Subcutaneous Q14 Days Javier Glazier, MD   150 mg at 09/20/15 1633  . omalizumab Arvid Right) injection 150 mg  150 mg Subcutaneous Q14 Days Javier Glazier, MD   150 mg at 10/19/15 1618  . omalizumab Arvid Right) injection 150 mg  150 mg Subcutaneous Q14 Days Javier Glazier, MD   150 mg at 12/14/15 1217  . omalizumab Arvid Right) injection 150 mg  150 mg Subcutaneous Q14  Days Javier Glazier, MD   150 mg at 01/11/16 1156  . omalizumab Arvid Right) injection 150 mg  150 mg Subcutaneous Q14 Days Javier Glazier, MD   150 mg at 02/10/16 1156  . omalizumab Arvid Right) injection 150 mg  150 mg Subcutaneous Q14 Days Collene Gobble, MD   150 mg at 03/13/16 1531    Allergies:  Allergies  Allergen Reactions  . Dust Mite Extract Shortness Of Breath and Other (See Comments)    "sneezing" (02/20/2012)  . Molds & Smuts Shortness Of Breath  . Morphine Sulfate Itching  . Other Shortness Of Breath and Other (See Comments)    Grass and weeds "sneezing; filled sinuses" (02/20/2012)  . Penicillins Rash and Other (See Comments)    "welts" (02/20/2012)  . Reclast [Zoledronic Acid]     Put in hospital  . Rofecoxib Swelling    Celebrex REACTION: feet swelling  . Shrimp Flavor Anaphylaxis    ALL SHELLFISH  . Tetracycline Hcl Nausea And Vomiting  . Dilaudid [Hydromorphone Hcl] Itching  . Hydrocodone-Acetaminophen Nausea And Vomiting  . Levofloxacin Other (See Comments)    REACTION: GI upset  . Oxycodone Hcl Nausea And Vomiting  . Paroxetine Nausea And Vomiting    Paxil   . Diltiazem Swelling  . Tree Extract Other (See Comments)    "tested and told I was allergic to it; never experienced a reaction to it" (02/20/2012)    Social History   Social History  . Marital status: Divorced    Spouse  name: N/A  . Number of children: 2  . Years of education: college   Occupational History  . Disabled     Retired Print production planner  .  Retired   Social History Main Topics  . Smoking status: Passive Smoke Exposure - Never Smoker  . Smokeless tobacco: Never Used     Comment: Parents  . Alcohol use 0.0 oz/week     Comment: 02/20/2012 "couple glasses of wine/6 months"  . Drug use: No  . Sexual activity: No   Other Topics Concern  . Not on file   Social History Narrative   Patient lives at home alone. Patient  divorced.    Patient has her BS degree.   Right handed.   Caffeine- sometimes coffee.      [facility]:   Born in [place], [place]. She worked as a Copywriter, advertising. She has no pets currently. She does have indoor plants. Previously had mold in her home that was remediated. Carpet was removed.              Family History  Problem Relation Age of Onset  . Allergies Mother   . Heart disease Mother   . Arthritis Mother   . Lung cancer Mother   . Diabetes Mother   . Allergies Father   . Heart disease Father   . Arthritis Father   . Stroke Father   . Colon cancer      Maternal half aunt/Maternal half uncle  . Colitis Daughter   . Diabetes Maternal Grandfather      Review of Systems:  See HPI for pertinent ROS. All other ROS negative.    Physical Exam: Blood pressure 130/70, pulse 80, temperature 97.8 F (36.6 C), temperature source Oral, resp. rate 14, weight 125 lb (56.7 kg), SpO2 98 %., Body mass index is 23.62 kg/m. General: WNWD WF. Appears in no acute distress. Neck: Supple. No thyromegaly. No lymphadenopathy. Lungs: Clear bilaterally to auscultation without  wheezes, rales, or rhonchi. Breathing is unlabored. Heart: RRR with S1 S2. No murmurs, rubs, or gallops. Musculoskeletal:  Strength and tone normal for age. Extremities/Skin: She has focal area of puffy swelling on same area on both feet----just posterior to medial malleolus. No edema  on dorsum of feet or up calf/shin.  Neuro: Alert and oriented X 3. Moves all extremities spontaneously. Gait is normal. CNII-XII grossly in tact. Psych:  Responds to questions appropriately with a normal affect.     ASSESSMENT AND PLAN:  66 y.o. year old female with   1. Hospital discharge follow-up  2. Community acquired pneumonia, unspecified laterality  Blood culture negative Reassured her that the area of puffiness on her feet is most likely related to the high-dose prednisone that was given at the hospital as well as IV fluids given at hospital. Should gradually resorb. Oxygen saturation is 98% on room air. Lungs are clear on exam. No changes in meds at this time.  F/U when due for routine OV, routine f/u.   [address] [place], [place], [facility] 04/25/2016 3:34 PM

## 2016-04-28 IMAGING — CR DG LUMBAR SPINE COMPLETE 4+V
5 series · 5 of 5 positions shown · non-contrast
Comparison: CT 03/31/2007

CLINICAL DATA: Back pain for several weeks with lower extremity
radiculopathy bilaterally. No trauma.

EXAM:
LUMBAR SPINE - COMPLETE 4+ VIEW

[t l-spine a.p.]
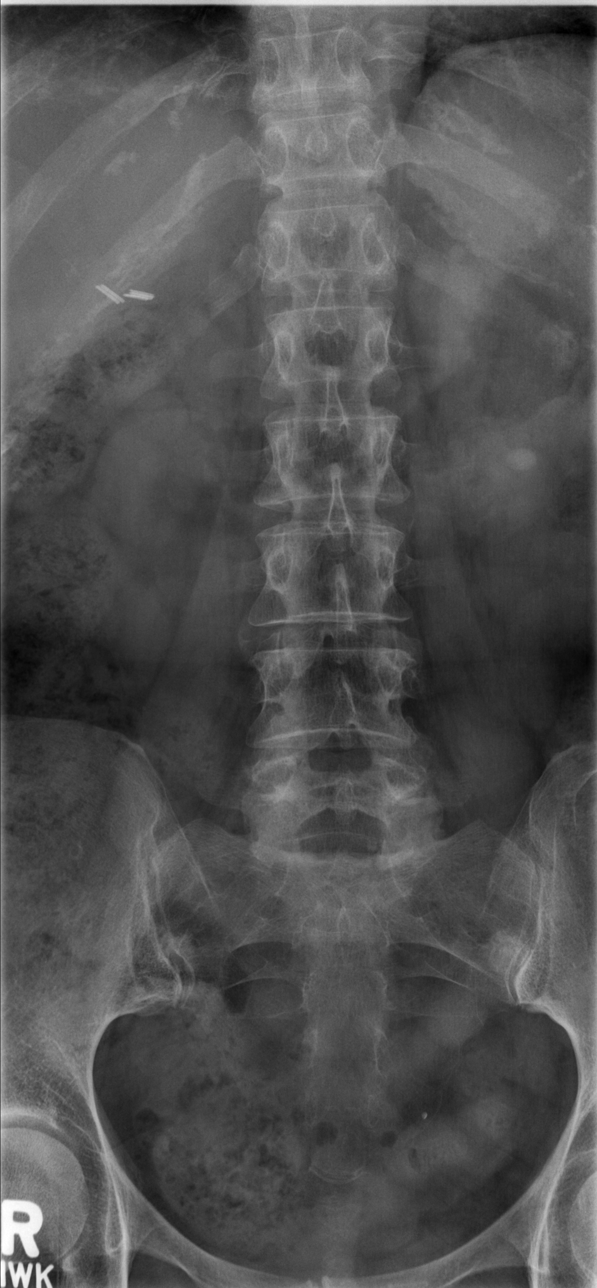

[t l-spine oblique exposure (1 of 2)]
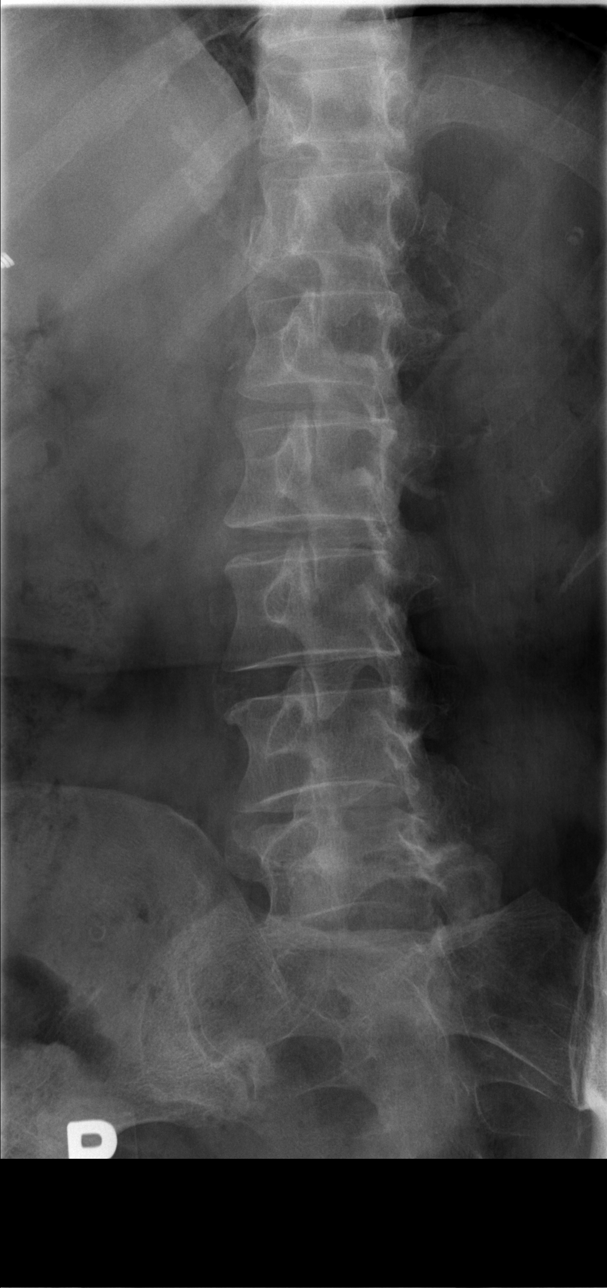

[t l-spine oblique exposure (2 of 2)]
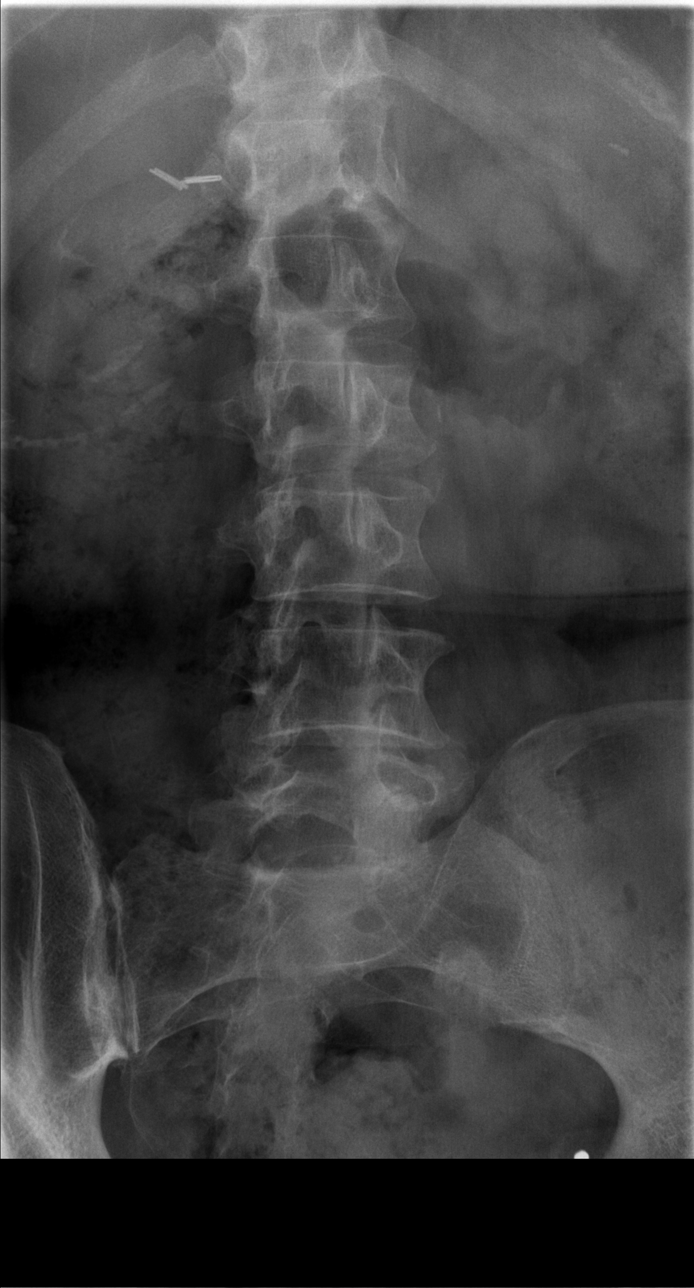

[t l-spine lat *]
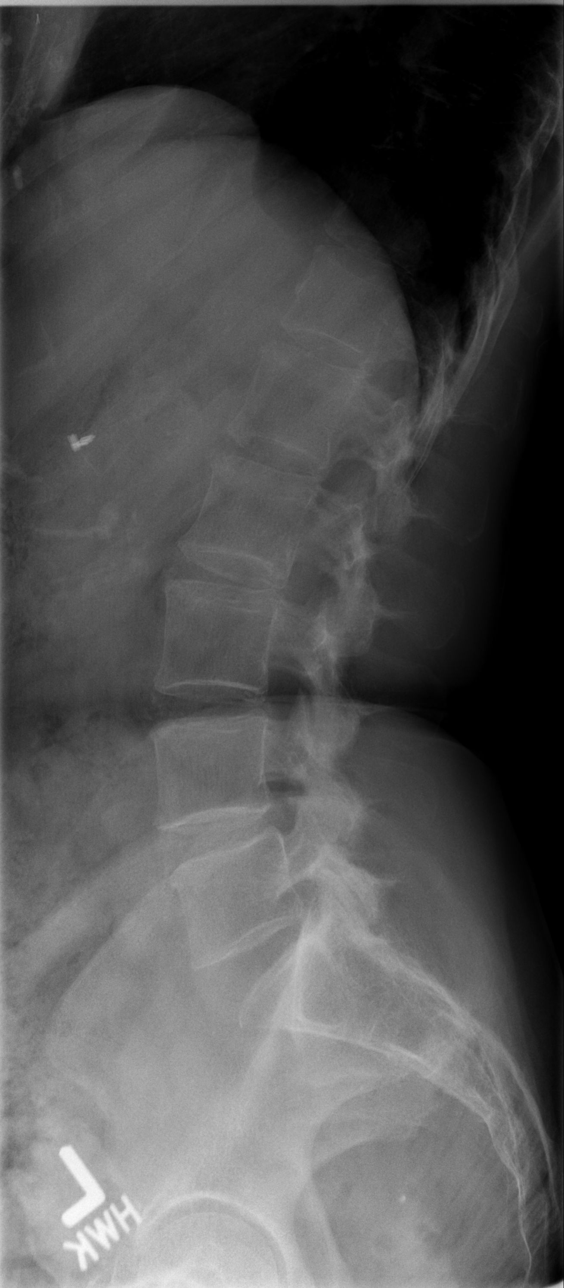

[t l-spine l5-s1 spot]
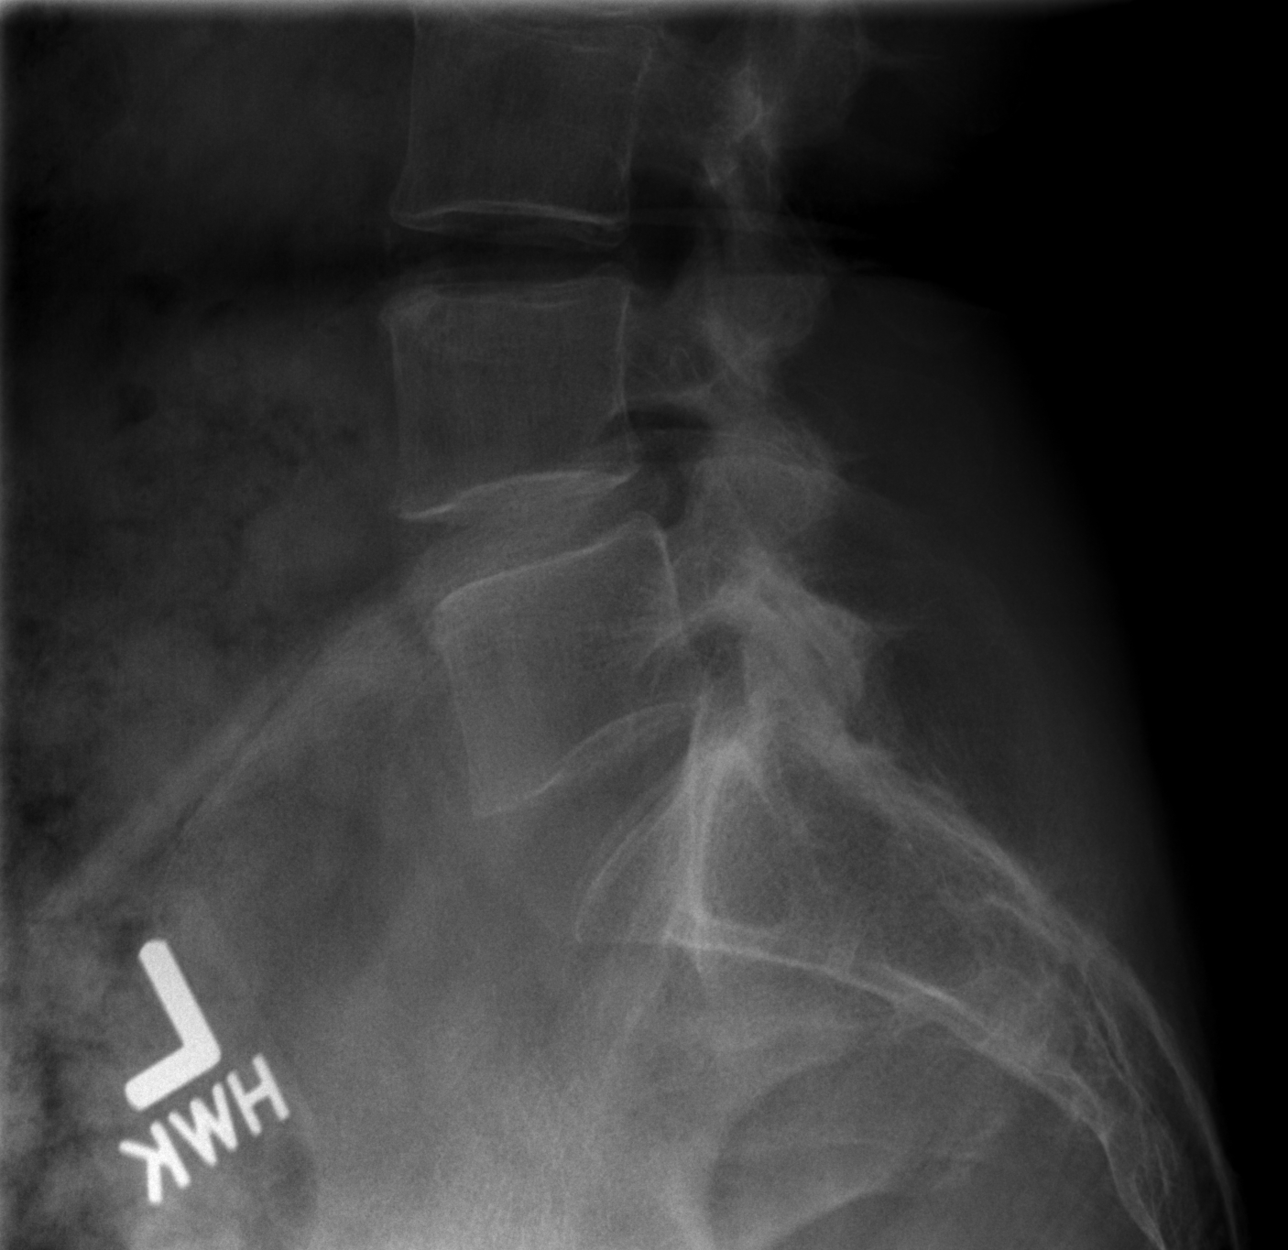

[5 of 5 positions shown; findings below may reference images not displayed]

FINDINGS: Vertebral body alignment, heights and disc space heights are within
normal. Facet arthropathy is present. There is minimal spondylosis
present. There is no compression fracture or subluxation. There are
surgical clips over the right upper quadrant. There is an oval 1 cm
density projected over the lower pole of the left kidney as cannot
exclude a stone.
IMPRESSION: Minimal spondylosis.  No acute findings.

Possible left renal stone.

## 2016-05-04 ENCOUNTER — Other Ambulatory Visit: Payer: Self-pay | Admitting: *Deleted

## 2016-05-04 ENCOUNTER — Other Ambulatory Visit: Payer: Self-pay | Admitting: Family Medicine

## 2016-05-04 MED ORDER — DEXLANSOPRAZOLE 60 MG PO CPDR: 60.0000 mg | DELAYED_RELEASE_CAPSULE | Freq: Every day | ORAL | 5 refills | Status: DC

## 2016-05-09 ENCOUNTER — Ambulatory Visit (INDEPENDENT_AMBULATORY_CARE_PROVIDER_SITE_OTHER): Payer: Medicare Other | Admitting: Pulmonary Disease

## 2016-05-09 ENCOUNTER — Encounter: Payer: Self-pay | Admitting: Pulmonary Disease

## 2016-05-09 ENCOUNTER — Other Ambulatory Visit: Payer: Self-pay | Admitting: Family Medicine

## 2016-05-09 ENCOUNTER — Other Ambulatory Visit: Payer: Self-pay | Admitting: *Deleted

## 2016-05-09 VITALS — BP 130/62 | HR 69 | Ht 62.0 in | Wt 125.0 lb

## 2016-05-09 DIAGNOSIS — R918 Other nonspecific abnormal finding of lung field: Secondary | ICD-10-CM | POA: Diagnosis not present

## 2016-05-09 DIAGNOSIS — R05 Cough: Secondary | ICD-10-CM

## 2016-05-09 DIAGNOSIS — J309 Allergic rhinitis, unspecified: Secondary | ICD-10-CM | POA: Diagnosis not present

## 2016-05-09 DIAGNOSIS — K219 Gastro-esophageal reflux disease without esophagitis: Secondary | ICD-10-CM | POA: Diagnosis not present

## 2016-05-09 DIAGNOSIS — J4550 Severe persistent asthma, uncomplicated: Secondary | ICD-10-CM

## 2016-05-09 DIAGNOSIS — R059 Cough, unspecified: Secondary | ICD-10-CM

## 2016-05-09 MED ORDER — NYSTATIN 100000 UNIT/ML MT SUSP: 5.0000 mL | Freq: Four times a day (QID) | OROMUCOSAL | 2 refills | Status: DC

## 2016-05-09 MED ORDER — VINATE M 27-1 MG PO TABS: 1.0000 | ORAL_TABLET | Freq: Every day | ORAL | 11 refills | Status: DC

## 2016-05-09 NOTE — Telephone Encounter (Signed)
Medication sent in. 

## 2016-05-09 NOTE — Telephone Encounter (Signed)
Patient is requesting a refill on her Nystatin? Is this okay? Please advise.

## 2016-05-09 NOTE — Telephone Encounter (Signed)
Please refill enough until next visit.

## 2016-05-09 NOTE — Patient Instructions (Signed)
   We are going to stop Xolair and start you on Nucala. Let me know if you have any new problems with the new medication.  Call me if you have any new breathing problems or questions before your next appointment.  TESTS ORDERED: 1. CXR PA/LAT in 2 weeks

## 2016-05-09 NOTE — Progress Notes (Signed)
Subjective:    Patient ID: Ebony Scott, female    DOB: 30-Jan-1951, 66 y.o.   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  HPI  Patient tested positive for influenza B in February and did have a nodular opacity in her right lower lobe on CT angiogram of the chest at that time.  Severe, persistent asthma: Patient previously on Xolair, Dulera, and Singulair. Previously tried on [place] without significant symptomatic benefit. Referred to [facility] at last appointment. Reviewing note from physician at [facility] they were unsure whether the referral was for her asthma or possible NTM infection. [facility] per her back on Spiriva Respimat. She feels like her dyspnea is worse than prior to her Influenza/Pneumonia that she had recently. She has been walking to her mailbox 150 feet which she is able to do in one trip but then has to rest. She is having trouble with dyspnea simply with vacuuming her house. She denies any wheezing. She hasn't had Xolair since January.   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 Not as evident on repeat CT angiogram of the chest. 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 She reports intermittent cough that is productive of a "viscous, white, foamy, thick" mucus.   Chronic allergic rhinitis: Previously evaluated by ENT & follows with Dr. Neldon Mc. Previously prescribed Zyrtec, Singulair, and Nasonex. She does feel her sinus congestion is more than her usual baseline. She reports she is producing mucus, predominantly from her left side. She does intermittently have epistaxis, mostly from the left side as well.   GERD: Currently follows with GI. Prescribed Dexilant and Zantac. She reports she is still having some intermittent dysphagia. Denies  any worsening in her baseline reflux or dyspepsia.   Review of Systems She reports some pain from her posterior thorax to around the side. She reports decreased energy & fatigue. No recent fever or chills.   Allergies  Allergen Reactions  . Dust Mite Extract Shortness Of Breath and Other (See Comments)    "sneezing" (02/20/2012)  . Molds & Smuts Shortness Of Breath  . Morphine Sulfate Itching  . Other Shortness Of Breath and Other (See Comments)    Grass and weeds "sneezing; filled sinuses" (02/20/2012)  . Penicillins Rash and Other (See Comments)    "welts" (02/20/2012)  . Reclast [Zoledronic Acid]     Put in hospital  . Rofecoxib Swelling    Celebrex REACTION: feet swelling  . Shrimp Flavor Anaphylaxis    ALL SHELLFISH  . Tetracycline Hcl Nausea And Vomiting  . Dilaudid [Hydromorphone Hcl] Itching  . Hydrocodone-Acetaminophen Nausea And Vomiting  . Levofloxacin Other (See Comments)    REACTION: GI upset  . Oxycodone Hcl Nausea And Vomiting  . Paroxetine Nausea And Vomiting    Paxil   . Diltiazem Swelling  . Tree Extract Other (See Comments)    "tested and told I was allergic to it; never experienced a reaction to it" (02/20/2012)    Current Outpatient Prescriptions on File Prior to Visit  Medication Sig Dispense Refill  . albuterol (PROVENTIL) (2.5 MG/3ML) 0.083% nebulizer solution Take 3 mLs (2.5 mg total) by nebulization every 4 (four) hours as needed for wheezing or shortness of breath. 75 mL 0  . Alpha-D-Galactosidase (BEANO) TABS Take 1 tablet by mouth daily.     . Alpha-Lipoic  Acid 600 MG CAPS Take 600 mg by mouth daily. Once a day    . azelastine (ASTELIN) 0.1 % nasal spray USE 2 SPRAYS IN EACH NOSTRIL DAILY AS DIRECTED 30 mL 5  . cetirizine (ZYRTEC) 10 MG tablet Take 1/2 tablet daily    . Cholecalciferol (VITAMIN D3) 2000 units capsule 1/2 at bedtime    . colchicine 0.6 MG tablet TAKE 1 TABLET BY MOUTH DAILY 90 tablet 1  . cyproheptadine (PERIACTIN) 4 MG tablet Take two  tablets every evening 60 tablet 5  . denosumab (PROLIA) 60 MG/ML SOLN injection Inject 60 mg into the skin every 6 (six) months. Administer in upper arm, thigh, or abdomen    . dexlansoprazole (DEXILANT) 60 MG capsule Take 1 capsule (60 mg total) by mouth daily. 30 capsule 5  . diclofenac sodium (VOLTAREN) 1 % GEL Apply 2 g topically 4 (four) times daily. 1 Tube 0  . gabapentin (NEURONTIN) 600 MG tablet Take 1 tablet (600 mg total) by mouth daily. 90 tablet 3  . lactase (LACTAID) 3000 UNITS tablet Take 3 tablets by mouth as needed. 3-4 tabs with dairy liquids and solids    . lipase/protease/amylase (CREON) 12000 units CPEP capsule Take 1 capsule (12,000 Units total) by mouth 3 (three) times daily. CREON 24000-76000-120000 U Takes with meals 270 capsule 5  . mirtazapine (REMERON SOLTAB) 30 MG disintegrating tablet Take 1 tablet (30 mg total) by mouth at bedtime. 30 tablet 11  . mometasone (NASONEX) 50 MCG/ACT nasal spray Place 2 sprays into the nose 2 (two) times daily. 17 g 0  . mometasone-formoterol (DULERA) 200-5 MCG/ACT AERO Inhale 2 puffs into the lungs 2 (two) times daily. 1 Inhaler 5  . montelukast (SINGULAIR) 10 MG tablet TAKE 1 TABLET BY MOUTH ONCE DAILY 30 tablet 11  . pravastatin (PRAVACHOL) 20 MG tablet TAKE 1 TABLET BY MOUTH DAILY 90 tablet 3  . Prenatal Vit-Sel-Fe Fum-FA (VINATE M) 27-1 MG TABS Take 1 tablet by mouth daily. 30 each 11  . PRESCRIPTION MEDICATION Place 1 capsule into both ears daily as needed. Pt has compounded capsule (powder) that she uses In her ears as needed;  CHLORAMP/SULFU/AMPHO B/ H DROC 100-100-5 mg capsule    . Probiotic Product (ALIGN) 4 MG CAPS Take 1 tablet by mouth daily. Once a day    . PROCTO-MED HC 2.5 % rectal cream Place 2.5 Tubes rectally daily.    Marland Kitchen Propylene Glycol-Glycerin (SOOTHE OP) Apply to eye as needed.    . propylthiouracil (PTU) 50 MG tablet take 1/2 tablet (25 mg) twice daily (Patient taking differently: Take 50 mg by mouth daily. take 1/2  tablet (25 mg) twice daily) 30 tablet 5  . ranitidine (ZANTAC) 300 MG tablet Take 1 tablet (300 mg total) by mouth daily. 30 tablet 5  . Respiratory Therapy Supplies (FLUTTER) DEVI Use as directed 1 each 0  . Simethicone (GAS-X EXTRA STRENGTH) 125 MG CAPS Take 2 each by mouth daily as needed. For gas relief    . Spacer/Aero-Holding Chambers (AEROCHAMBER MV) inhaler Use as instructed 1 each 0  . omalizumab (XOLAIR) 150 MG injection Inject 150 mg into the skin every 28 (twenty-eight) days. 150 SQ every 4 weeks    . [DISCONTINUED] methimazole (TAPAZOLE) 5 MG tablet Take 5 mg by mouth 3 (three) times daily.       Current Facility-Administered Medications on File Prior to Visit  Medication Dose Route Frequency Provider Last Rate Last Dose  . omalizumab Arvid Right) injection 150 mg  150  mg Subcutaneous Q14 Days Javier Glazier, MD   150 mg at 09/20/15 1633  . omalizumab Arvid Right) injection 150 mg  150 mg Subcutaneous Q14 Days Javier Glazier, MD   150 mg at 10/19/15 1618  . omalizumab Arvid Right) injection 150 mg  150 mg Subcutaneous Q14 Days Javier Glazier, MD   150 mg at 12/14/15 1217  . omalizumab Arvid Right) injection 150 mg  150 mg Subcutaneous Q14 Days Javier Glazier, MD   150 mg at 01/11/16 1156  . omalizumab Arvid Right) injection 150 mg  150 mg Subcutaneous Q14 Days Javier Glazier, MD   150 mg at 02/10/16 1156  . omalizumab Arvid Right) injection 150 mg  150 mg Subcutaneous Q14 Days Collene Gobble, MD   150 mg at 03/13/16 1531    Past Medical History:  Diagnosis Date  . Allergic rhinitis   . Allergy    SEASONAL  . Anemia   . Anxiety    pt denies  . Arthritis    "mostly the hands" (02/20/2012)  . Asthma   . Breast cancer ([place]) 1998   in remission  . Cataract    REMOVED  . Chronic lower back pain   . Chronic neck pain   . Complication of anesthesia    "had hard time waking up from it several times" (02/20/2012)  . Depression    "some; don't take anything for it" (02/20/2012)  .  Diverticulosis   . DVT (deep venous thrombosis) ([place])   . Exertional dyspnea   . Fibromyalgia 11/2011  . GERD (gastroesophageal reflux disease)   . Graves disease   . Headache(784.0)    "related to allergies; more at different times during the year" (02/20/2012)  . Hemorrhoids   . Hiatal hernia    back and neck  . Hx of adenomatous colonic polyps 04/12/2016  . Hypercholesteremia    good cholesterol is high  . Hypothyroidism   . IBS (irritable bowel syndrome)   . Moderate persistent asthma    -FeV1 72% 2011, -IgE 102 2011, CT sinus Neg 2011  . Osteoporosis    on reclast yearly  . Pneumonia 04/2011; ~ 11/2011   "double; single" (02/20/2012)    Past Surgical History:  Procedure Laterality Date  . ANTERIOR AND POSTERIOR REPAIR  1990's  . APPENDECTOMY    . BREAST LUMPECTOMY  1998   left  . CARPOMETACARPEL ([place]) FUSION OF THUMB WITH AUTOGRAFT FROM RADIUS  ~ 2009   "both thumbs" (02/20/2012)  . CATARACT EXTRACTION W/ INTRAOCULAR LENS  IMPLANT, BILATERAL  2012  . CERVICAL DISCECTOMY  10/2001   C5-C6  . CERVICAL FUSION  2003   C3-C4  . CHOLECYSTECTOMY    . COLONOSCOPY    . DEBRIDEMENT TENNIS ELBOW  ?1970's   right  . ESOPHAGOGASTRODUODENOSCOPY    . HYSTERECTOMY    . KNEE ARTHROPLASTY  ?1990's   "?right; w/cartilage repair" (02/20/2012)  . NASAL SEPTUM SURGERY  1980's  . POSTERIOR CERVICAL FUSION/FORAMINOTOMY  2004   "failed initial fusion; rewired  anterior neck" (02/20/2012)  . TONSILLECTOMY  ~ 1953  . VESICOVAGINAL FISTULA CLOSURE W/ TAH  1988    Family History  Problem Relation Age of Onset  . Allergies Mother   . Heart disease Mother   . Arthritis Mother   . Lung cancer Mother   . Diabetes Mother   . Allergies Father   . Heart disease Father   . Arthritis Father   . Stroke Father   . Colon cancer  Maternal half aunt/Maternal half uncle  . Colitis Daughter   . Diabetes Maternal Grandfather     Social History   Social History  . Marital status: Divorced     Spouse name: N/A  . Number of children: 2  . Years of education: college   Occupational History  . Disabled     Retired Print production planner  .  Retired   Social History Main Topics  . Smoking status: Passive Smoke Exposure - Never Smoker  . Smokeless tobacco: Never Used     Comment: Parents  . Alcohol use 0.0 oz/week     Comment: 02/20/2012 "couple glasses of wine/6 months"  . Drug use: No  . Sexual activity: No   Other Topics Concern  . None   Social History Narrative   Patient lives at home alone. Patient  divorced.    Patient has her BS degree.   Right handed.   Caffeine- sometimes coffee.      [facility]:   Born in [place], [place]. She worked as a Copywriter, advertising. She has no pets currently. She does have indoor plants. Previously had mold in her home that was remediated. Carpet was removed.                Objective:   Physical Exam BP 130/62 (BP Location: Right Arm, Cuff Size: Normal)   Pulse 69   Ht 5\' 2"  (1.575 m)   Wt 125 lb (56.7 kg)   SpO2 98%   BMI 22.86 kg/m   Gen.: No distress. Comfortable. Central obesity noted. Integument: No rash or bruising on exposed skin. Warm and dry. HEENT: Minimal nasal turbinate swelling. No oral ulcers. Moist mucous membranes. Abdominal: Protuberant. Normal bowel sounds. Soft. Cardiovascular: Regular rate and rhythm. No edema. Unable to appreciate JVD. Pulmonary: Good aeration bilaterally and clear to auscultation. No accessory muscle use on room air.  PFT 08/08/15: FVC 2.08 L (75%) FEV1 1.73 L (82%) FEV1/FVC 0.83 FEF 25-75 2.41 L (129%) no bronchodilator response                                                                 DLCO corrected 66% (hemoglobin 10.5) 04/29/15: FVC 2.24 L (81%) FEV1 1.95 L (92%) FEV1/FVC 0.87 FEF 25-75 2.59 L (137%) no bronchodilator response TLC 3.91 L (84%) RV 83% ERV 61% DLCO corrected 62% (hemoglobin 13.7) 04/10/12:FVC 1.90 L (69%) FEV1 1.49 L (75%) FEV1/FVC 0.78 FEF 25-75  1.39 L (59%)                                                TLC 3.55 L (80%) RV 62% ERV 52% DLCO uncorrected 72%  6MWT 08/08/15:  Walked 267 meters / Baseline Sat 99% on RA / Nadir Sat 97% on RA  IMAGING CTA CHEST 04/15/16 (personally reviewed by me):  No pulmonary embolism. Dependent subpleural atelectasis in the lower lobes. Patient does have patchy nodular opacification within right lower lobe. No pleural effusion appreciated. No pathologic mediastinal adenopathy. No pericardial effusion.  CT CHEST W/O 11/03/15 (previously reviewed by me): No pleural effusion or thickening. No pathologic mediastinal adenopathy. No pericardial  effusion. Tree-in-bud opacification within right upper lobe as well as patchy opacification linearly in right middle lobe and inferior lingula.  CT MAXILLOFACIAL W/O 08/12/15 (per radiologist): Paranasal sinuses clear without mucosal thickening or air-fluid level.  CT MAXILLOFACIAL 05/21/13 (per radiologist): Negative for chronic sinus disease.  CT CHEST W/O 06/05/12 (per radiologist): Resolution of previous right apical ground glass. Persistent tree-in-bud nodularity within basal portion RUL.  MICROBIOLOGY Sputum Ctx (11/22/15):  Normal Oral Flora / AFB negative / Fungus negative  Sputum Ctx (09/06/15):  Abundant Squamous Epithelials / Oral Flora / Mycobacterium gordonae / Fungus negative   LABS 12/07/15 CBC:  6.5/11.9/36.0/228 Eos:  455  11/09/14 BMP: 141/4.4/104/27/27/0.98/82/9.3 LFT: 4.1/6.5/0.08/17/28/31 CBC: 6.7/12.4/36.9/233  05/31/14 URINE TOX SCREEN: NEGATIVE  12/16/12 IgE: 206.6  04/01/12 RAST PANEL: RAGWEED 0.14 (class 0/1) IgE: 409.9 ASPERGILLUS ANTIGEN: 1.92  01/02/12 RAST PANEL: Weakly Pan-Positive Up to Class 1 IgE: 825.8  06/28/11 IgG: 623 IgA: 134    Assessment & Plan:  66 y.o. female with severe, persistent asthma, tree-in-bud opacities, chronic allergic rhinitis, & GERD. With the right lower lobe opacities on CT imaging  performed in February we will need to ensure that these resolved with repeat chest x-ray imaging. Her worsening in respiratory status is likely due to her recent influenza and pneumonia. I did inform the patient she would likely be slow to recovery. I feel it's reasonable to transition the patient over to an alternative biologic agent from [place]. I did caution the patient that there can be side effects with this medication class as well. Overall her chronic allergic rhinitis and reflux seem to be well-controlled. I instructed the patient contact my office if she had any new breathing problems or questions before next appointment.  1. Severe, persistent asthma:  Continuing patient on Spiriva, Dulera, and Singulair. Stopping Xolair. Starting [place] patient educated on potential risk of side effects and adverse reactions 2. 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 Question possible ABPA/Aspergillus. Holding off on bronchoscopy and repeat chest CT imaging at this time. 3. Chronic allergic rhinitis:  Continuing to follow with Dr. Neldon Mc. Continuing on Nasonex, Singulair, and Zyrtec. 4. GERD: minimal symptoms. Continuing on Exelon and Zantac.  5. Recent pneumonia: Repeat chest x-ray PA/LAT in 2 weeks to ensure resolution of right lower lobe opacities.  6. Health maintenance: Status post Influenza Vaccine September 2017, Prevnar March 2015, Tdap June 2015, & Pneumovax October 2013.   7. Follow-up: Return to clinic in  2 months or sooner if needed.  Sonia Baller Ashok Cordia, M.D. [facility] Pager:  [phone] After 3pm or if no response, call [phone] 11:38 AM 05/09/16

## 2016-05-10 ENCOUNTER — Telehealth: Payer: Self-pay | Admitting: Pulmonary Disease

## 2016-05-10 NOTE — Telephone Encounter (Signed)
Pt dropped off pt assistance forms that will need to be completed for the pt to get help with her medications.  These have been placed in the folder up front for JN.  Will forward to Carleigh to follow up on these forms.

## 2016-05-13 ENCOUNTER — Encounter: Payer: Self-pay | Admitting: Pulmonary Disease

## 2016-05-14 NOTE — Telephone Encounter (Signed)
Dear Dr. Ashok Cordia,   I have a few questions about the Bronchoscopy.  1. What are the risks of the bronchscopy?   2. In what percentage of patients do these risks occur?"   3. What are the risks of NOT doing a bronchscopy if I have one or both of the infections?  4. If I do have an active infection or mold, and it go unchecked, can it do any damage to my lungs?  5. In what percentage of patients do these infections eventually lead to debilitation or mortality?  6. Will these infections typically show up on an MRI?  The doctor at [facility] indicated that since my first MRI was clear she didn't recommend doing a bronochscopy.   I also dropped by the application for the financial aid for the [place], and left a message for your nurse that it needed to have immediate attention and so I hope she got up with you to finish filling out your part and has gotten it mailed in so we will know if I qualify for assistance or not. If I can't get help; I won't be able to take this new medication.   Thanks    JN please advise on pt's above listed questions.  Thanks!

## 2016-05-15 NOTE — Telephone Encounter (Signed)
Patient called checking on status of financial aid application for N[place] - she can be reached at [phone] - pt is okay to have message left on vm -pr

## 2016-05-15 NOTE — Telephone Encounter (Signed)
Dr. Ashok Cordia  Please Advise- Please see most recent email    Message -----  From: Ebony Scott. Ebony Scott  Sent: 3/26/20186:10 PM EDT  To: Tera Partridge, MD Subject: RE: Non-Urgent Medical Question  When will you have time to call me and discuss these questions.  I am home most of the time since I am still recovering.  If I know you are available I will make sure I am near the phone. I know i will have at least a $250.00 co pay for the hospital part and then thelabs and doctor charges for reading the samples tests. Are there any other medications you can use to keep me from coughing so I don't have to be put to sleep??? (I am allergic to Morphine in case you for got.).  My home number is: [phone] Thanks so much for your time and consideration.  Ebony Scott

## 2016-05-15 NOTE — Telephone Encounter (Signed)
Carleigh, please advise on the forms that the pt had dropped off for JN to complete and sign for financial aid application for N[place].  thanks

## 2016-05-21 ENCOUNTER — Telehealth: Payer: Self-pay | Admitting: Family Medicine

## 2016-05-21 NOTE — Telephone Encounter (Signed)
CB# [phone] Patient called to see if she needed fasting blood work this month and if so when?

## 2016-05-21 NOTE — Telephone Encounter (Signed)
Ebony Scott has received forms and his awaiting JN to return to sign.

## 2016-05-25 ENCOUNTER — Other Ambulatory Visit: Payer: Self-pay

## 2016-05-25 MED ORDER — CYPROHEPTADINE HCL 4 MG PO TABS: ORAL_TABLET | ORAL | 5 refills | Status: DC

## 2016-05-30 ENCOUNTER — Ambulatory Visit (INDEPENDENT_AMBULATORY_CARE_PROVIDER_SITE_OTHER)
Admission: RE | Admit: 2016-05-30 | Discharge: 2016-05-30 | Disposition: A | Discharge status: Home / Self Care | Payer: Medicare Other | Source: Ambulatory Visit | Attending: Pulmonary Disease | Admitting: Pulmonary Disease

## 2016-05-30 ENCOUNTER — Other Ambulatory Visit: Payer: Self-pay | Admitting: Family Medicine

## 2016-05-30 DIAGNOSIS — R05 Cough: Secondary | ICD-10-CM | POA: Diagnosis not present

## 2016-05-30 DIAGNOSIS — R059 Cough, unspecified: Secondary | ICD-10-CM

## 2016-05-30 NOTE — Telephone Encounter (Signed)
Appt made for labs °

## 2016-05-31 NOTE — Telephone Encounter (Signed)
Ebony Scott has this been completed?  May we sign off of this message?  thanks

## 2016-06-04 ENCOUNTER — Telehealth: Payer: Self-pay | Admitting: Pulmonary Disease

## 2016-06-04 NOTE — Telephone Encounter (Signed)
Spoke with patient and informed her that her paperwork was faxed to [facility]. Verbalized understanding that we are awaiting a decision. Pt did not have any questions regarding paperwork.

## 2016-06-04 NOTE — Telephone Encounter (Signed)
Spoke with patient and informed her that paperwork was fa

## 2016-06-04 NOTE — Telephone Encounter (Signed)
While speaking to patient regarding paperwork, she asked what type anesthesia will be used when she has a bronch. She wanted to know if her husband needs to drive her for procedure appointment.  JN please advise if it will be general or local. Thank you!

## 2016-06-04 NOTE — Telephone Encounter (Signed)
JN please advise. Thanks. 

## 2016-06-04 NOTE — Telephone Encounter (Signed)
Information was completed and faxed to [facility]. Will await for fax confirmation before contacting patient about information being sent.

## 2016-06-07 NOTE — Progress Notes (Signed)
Spoke with patient and informed her of results. Pt did not have any questions and verbalized understanding. Nothing further is needed.

## 2016-06-11 ENCOUNTER — Other Ambulatory Visit: Payer: Self-pay | Admitting: Family Medicine

## 2016-06-11 ENCOUNTER — Other Ambulatory Visit: Payer: Self-pay | Admitting: Allergy

## 2016-06-11 NOTE — Telephone Encounter (Signed)
The pt.'s signature and Financial info. Was not copied and then faxed to [employer]. They called 06/08/16, and I faxed it to them. We're still having some problems with the fax & phones.They probably faxed the confirmation but we haven't been able to receive faxes. I called [employer] spoke to a person, apparently the call dropped, I couldn't hear anyone on the line after that. (hung up)

## 2016-06-12 ENCOUNTER — Other Ambulatory Visit: Payer: Medicare Other

## 2016-06-13 NOTE — Telephone Encounter (Signed)
Please contact the patient. Please postpone her appointment until after she has received 2 injections of the new medication please. Thank you.

## 2016-06-13 NOTE — Telephone Encounter (Signed)
Please see my note previously dictated and let the patient know. Thank you.

## 2016-06-13 NOTE — Telephone Encounter (Signed)
It will be conscious sedation and therefore she will not be able to operate a motor vehicle. 24 hours. She will require someone to drive her to the procedure as well as from the procedure and be with her following. Thank you.

## 2016-06-14 ENCOUNTER — Other Ambulatory Visit: Payer: Medicare Other

## 2016-06-14 ENCOUNTER — Telehealth: Payer: Self-pay | Admitting: Pulmonary Disease

## 2016-06-14 DIAGNOSIS — E785 Hyperlipidemia, unspecified: Secondary | ICD-10-CM

## 2016-06-14 DIAGNOSIS — Z79899 Other long term (current) drug therapy: Secondary | ICD-10-CM

## 2016-06-14 LAB — COMPREHENSIVE METABOLIC PANEL
ALT: 25 U/L (ref 6–29)
AST: 26 U/L (ref 10–35)
Albumin: 4 g/dL (ref 3.6–5.1)
Alkaline Phosphatase: 31 U/L — ABNORMAL LOW (ref 33–130)
BUN: 18 mg/dL (ref 7–25)
CO2: 23 mmol/L (ref 20–31)
Calcium: 9.4 mg/dL (ref 8.6–10.4)
Chloride: 108 mmol/L (ref 98–110)
Creat: 1.08 mg/dL — ABNORMAL HIGH (ref 0.50–0.99)
Glucose, Bld: 85 mg/dL (ref 70–99)
Potassium: 4.2 mmol/L (ref 3.5–5.3)
Sodium: 143 mmol/L (ref 135–146)
Total Bilirubin: 0.4 mg/dL (ref 0.2–1.2)
Total Protein: 6 g/dL — ABNORMAL LOW (ref 6.1–8.1)

## 2016-06-14 LAB — LIPID PANEL
Cholesterol: 192 mg/dL (ref <200)
HDL: 70 mg/dL (ref >50)
LDL Cholesterol: 90 mg/dL (ref <100)
Total CHOL/HDL Ratio: 2.7 Ratio (ref <5.0)
Triglycerides: 160 mg/dL — ABNORMAL HIGH (ref <150)
VLDL: 32 mg/dL — ABNORMAL HIGH (ref <30)

## 2016-06-14 NOTE — Telephone Encounter (Signed)
Called spoke with patient and advised of JN's recommendations  Pt okay with this and voiced her understanding Pt did ask if we have heard if she has been approved for the grant for her Nucala - advised pt will check on this for her and let her know.  Pt requests to be emailed back. Routing to [employer] to assist with this process

## 2016-06-14 NOTE — Telephone Encounter (Signed)
[place] was called for update on copay assistance. Per Marliss Coots [facility] representative), pt was denied copay assistance through [employer] due to her having medicare; in order for to qualify she can't have government insurance. Per representative she had three options:  1. Continue to pay her copayment of $35 until her out of pocket deductible of $3300 was met; she currently has met $580.71. 2. File a PA with [employer] at [phone]. 3. See if the Patient [place] at [phone] has any funds that could help.   Will route to Tammy to follow up.

## 2016-06-14 NOTE — Telephone Encounter (Signed)
Spoke with patient and informed her she will need a driver when she has her bronchoscopy. Pt verbalized understanding. Nothing further is needed.

## 2016-06-15 ENCOUNTER — Encounter: Payer: Self-pay | Admitting: Family Medicine

## 2016-06-15 ENCOUNTER — Telehealth: Payer: Self-pay | Admitting: Family Medicine

## 2016-06-15 ENCOUNTER — Other Ambulatory Visit: Payer: Self-pay | Admitting: Family Medicine

## 2016-06-15 MED ORDER — LIDOCAINE 5 % EX PTCH: 1.0000 | MEDICATED_PATCH | CUTANEOUS | 5 refills | Status: DC

## 2016-06-15 NOTE — Telephone Encounter (Signed)
Called pt. And explained what was going on nothing further needed.

## 2016-06-15 NOTE — Telephone Encounter (Signed)
PA submitted through [email or web address] and received the following: [employer] is reviewing your PA request. Typically an electronic response will be received within 72 hours. To check for an update later, open this request from your dashboard

## 2016-06-15 NOTE — Telephone Encounter (Signed)
I spoke with pt., I'm going to file a p/a with [employer], if the nucala is still too expensive I'll call [employer] for assistance. Pt. Understood and was very kind when I told her it may take a while since I have been paralyzed by the fax machine not working for the last wk. I will get to it as soon as I can.

## 2016-06-18 MED ORDER — LIDOCAINE 5 % EX PTCH: 1.0000 | MEDICATED_PATCH | CUTANEOUS | 5 refills | Status: DC

## 2016-06-18 NOTE — Telephone Encounter (Signed)
PA was denied through insurance - pharm aware

## 2016-06-20 NOTE — Telephone Encounter (Signed)
Called [facility] p/a dept. The rep said pt's nucala approved and faxed 06/15/16. We hadn't received it because our fax machine was down. Rep. refaxed 06/20/16. Called pt. To give her an update, lmom. I told her once I get the fax I should be able to call and have the nucala shipped soon. Will call [facility] back and set up del. Once I get the p/a # and dates.  Pt. Called back letting me know she got my message. She said  [facility] had called her a couple of times and left a message for her to call back. She didn't call them back she said b/c she knew we were taking care of her Nucala. I told her to call  b/c they probably need more pt. Info. Than they'll alloow [place] to give to them. She's going to call them. Will wait for fax to find out where to go from here.   Called pt. To let her know [employer] denied her request for co-pay assistance. She received the letter. I advise her to call [employer] and she said she would try calling them today. If she's not able to get in touch with today, she'll call Monday.  Ebony Scott, Ebony Scott is going to be able to get funding from [employer] for the remainder of this year but with $100 of pocket and $25 to $35 for her injections, she'll be about $80 over budget. Their giving her $1000. Roughly she'll miss a month, that is if she's able to get funding next year and assuming that nucala helps her. Her question to Dr. Ashok Cordia is if she has to stop nucula will there be any serious side effects. JN had told pt. He had to d/c a pt.'s nucala due to headache. This concerns her because she lives alone and she afraid of getting a migraine. Please advise.

## 2016-07-02 NOTE — Telephone Encounter (Signed)
Patient calling back, please call [phone].

## 2016-07-03 NOTE — Telephone Encounter (Signed)
Nucala can cause a headache. Abrupt discontinuation is not known to cause a headache.

## 2016-07-03 NOTE — Telephone Encounter (Signed)
Called pt. And gave her JN's recs.. She has decided to start nucala. Nothing further needed, at this time.

## 2016-07-03 NOTE — Telephone Encounter (Signed)
Received from [employer] "Ebony Scott, Mrs. Culverhouse is going to be able to get funding from [employer] for the remainder of this year but with $100 of pocket and $25 to $35 for her injections, she'll be about $80 over budget. Their giving her $1000. Roughly she'll miss a month, that is if she's able to get funding next year and assuming that nucala helps her. Her question to Dr. Ashok Cordia is if she has to stop nucula will there be any serious side effects. JN had told pt. He had to d/c a pt.'s nucala due to headache. This concerns her because she lives alone and she afraid of getting a migraine. Please advise." JN please advise. Thank you!

## 2016-07-04 ENCOUNTER — Telehealth: Payer: Self-pay | Admitting: Pulmonary Disease

## 2016-07-04 NOTE — Telephone Encounter (Signed)
States it may be next week until she knows about insurance appeal.

## 2016-07-04 NOTE — Telephone Encounter (Signed)
I  spoke to Ebony Scott  And she decided to go with specialty pharm. Thru [employer]: $50 is her part for the medication and the $35 co-pay for an ov. I called [employer], the only summary of benefits they had was Part B $ 100 co-pay (pt. Cost for med.) The rep. Said she'd call me back. Waiting to hear if the rep. Was able to get the specialty pharm.( [employer]).

## 2016-07-05 ENCOUNTER — Telehealth: Payer: Self-pay | Admitting: Pulmonary Disease

## 2016-07-05 NOTE — Telephone Encounter (Signed)
Routing to Tammy Scott for follow-up. 

## 2016-07-09 ENCOUNTER — Other Ambulatory Visit: Payer: Self-pay

## 2016-07-09 MED ORDER — MIRTAZAPINE 30 MG PO TBDP: 30.0000 mg | ORAL_TABLET | Freq: Every day | ORAL | 5 refills | Status: DC

## 2016-07-09 NOTE — Telephone Encounter (Signed)
Tammy please see phone message from 07/05/16

## 2016-07-09 NOTE — Telephone Encounter (Signed)
Per Dr Carlean Purl okay to refill her mirtazapine.

## 2016-07-10 NOTE — Telephone Encounter (Signed)
TS to give updates today.

## 2016-07-10 NOTE — Telephone Encounter (Addendum)
I got the message about the P/A before I left. I called and asked [facility] to fax the P/A, it hadn't come when I left at 5:30 Thurs.. I faxed it to [facility] today. (07/10/16 upon my arival)

## 2016-07-10 NOTE — Telephone Encounter (Addendum)
I got the message about the P/A before I left I just forgot to document it. I just received the fax 07/10/16, when I returned from being off Fri. & Mon.. Faxed to [facility] 07/10/16. This may take a few days, will close note and create a new one as needed. Nothing further needed at this time.

## 2016-07-12 ENCOUNTER — Telehealth: Payer: Self-pay | Admitting: Pulmonary Disease

## 2016-07-12 NOTE — Telephone Encounter (Signed)
[facility] fax [place] a letter saying med. Was approved thru 02/18/17 under Part D. Called pt. To make her aware, voiced understanding. She is going to go into My Chart and send JN a message. Will leave note open til she gets a response. His answer will determine if she starts the med. Or not.

## 2016-07-13 ENCOUNTER — Encounter: Payer: Self-pay | Admitting: Pulmonary Disease

## 2016-07-15 ENCOUNTER — Encounter: Payer: Self-pay | Admitting: Pulmonary Disease

## 2016-07-17 ENCOUNTER — Encounter: Payer: Self-pay | Admitting: Pulmonary Disease

## 2016-07-17 NOTE — Telephone Encounter (Signed)
Please advise Dr Ashok Cordia of any rec's for patient on issues with Nucala.  Pt cannot afford to pay for injections out of pocket -$135/injection PLUS her maint inhalers Dulera and Spiriva.  Pt is not able to get any funding to help assist in payment.

## 2016-07-17 NOTE — Telephone Encounter (Signed)
Please see patient e-mail, pt is asking about Fasenra vs Nucala.  Dr Ashok Cordia, please advise. Thanks.

## 2016-07-18 NOTE — Telephone Encounter (Signed)
Sorry to bother you again this evening, I forgot to tell you I got a letter form [facility] today stating that I could have my Rx filled for the Nucala at my pharmacy.  So does that mean I will have to find someone to give it to me, maybe my daughter?? Or bring it to your office to get the shot???? Could my Primary Doctor's office give it to me????  Have a blessed evening, Ebony Scott   [facility] please advise if she can bring the medication to our office to have the injection or get it at her PCP or can her daughter give this medication to her?  Thanks

## 2016-07-18 NOTE — Telephone Encounter (Signed)
Don't know there's a financial advantage to her PCP administering it. Also not sure there's an advantage to her pharmacy filling the Rx either. Given that we don't know any potential reactions this should be administered, at least initially, in our office where we are familiar with it and there are acute PCCM doctors available if needed.

## 2016-07-20 ENCOUNTER — Ambulatory Visit: Payer: Medicare Other | Admitting: Pulmonary Disease

## 2016-07-23 NOTE — Telephone Encounter (Signed)
Faxed P/A JN had to fill out, as soon as Marily Lente gave it to me this afternoon. Fax was sent ok. I will wait to hear from [facility]. Hopefully they'll call ready to set up shipment. If pt. Thinks she can afford. $100 co-pay q month.

## 2016-07-26 NOTE — Telephone Encounter (Signed)
Patient called regarding clarification of getting Nucala at the pharmacy and finding someone to administer the drug - She can be reached at [phone] -pr

## 2016-07-27 ENCOUNTER — Telehealth: Payer: Self-pay | Admitting: Pulmonary Disease

## 2016-07-27 NOTE — Telephone Encounter (Signed)
Spoke with pt. States that her symptoms have not changed and wants to start Nucala. I spoke with Alroy Bailiff and was advised that she has been approved for Nucala but she has a $100.00 copay. Pt was already aware of this. Before the conversation was ended, she changed her mind and told me that she would like to think about this a little more before she commits to it. Nothing further was needed at this time.

## 2016-07-27 NOTE — Telephone Encounter (Signed)
In ph. Pt. Left for JN 07/27/16 she is going to think about it for a little while longer before making a decision on wether to start her nucala or not. It's $100 co-pay and with her other medicines it's hard to manage.(fixed income) She can't get help from [employer] b/c of gov. Ins.. Nothing further needed at this time. Closing.

## 2016-07-27 NOTE — Telephone Encounter (Signed)
I spoke to the pt. And reminded her about the e-mail she sent and JN responded to . Patient Email Open    07/17/2016 [facility]  Ebony Scott  Pulmonary Disease   Conversation: Non-Urgent Medical Question  (Newest Message First)  Jul 18, 2016        3:32 PM  Ebony Scott routed this conversation to Ebony Scott, Ebony Scott      3:31 PM  Note    Don't know there's a financial advantage to her PCP administering it. Also not sure there's an advantage to her pharmacy filling the Rx either. Given that we don't know any potential reactions this should be administered, at least initially, in our office where we are familiar with it and there are acute PCCM doctors available if needed.          3:24 PM  Ebony Scott  Ebony Scott  to Ebony Scott       3:24 PM  Ebony Scott,   I will get your message over to Dr. Ashok Cordia and once we hear back from him, we will email you back with his response. If you have any further questions, please let us know. Have a good evening.   [place] [facility] Triage Team    Last read by Ebony Scott at 4:05 PM on 07/18/2016.  Ebony Scott      3:22 PM  Note    Sorry to bother you again this evening, I forgot to tell you I got a letter form [facility] today stating that I could have my Rx filled for the Nucala at my pharmacy.  So does that mean I will have to find someone to give it to me, maybe my daughter?? Or bring it to your office to get the shot???? Could my Primary Doctor's office give it to me????  Have a blessed evening, Ebony Scott   [facility] please advise if she can bring the medication to our office to have the injection or get it at her PCP or can her daughter give this medication to her?  Thanks     Jul 17, 2016  Ebony Scott  to Ebony Scott       8:07 PM  Sorry to bother you again this  evening,  I forgot to tell you I got a letter form [facility] today stating that I could have my Rx filled for the [place] at my pharmacy.  So does that mean I will have to find someone to give it to me, maybe my daughter?? Or bring it to your office to get the shot????  Could my Primary Doctor's office give it to me????  Have a blessed evening,  Ebony Scott   This encounter is not signed. The conversation may still be ongoing.  Additional Documentation   Encounter Info:   Billing Info,   History,   Allergies,   Detailed Report     Orders Placed    None  Medication Renewals and Changes     None    Medication List  Visit Diagnoses     None    Problem List  She said she'd have to go back and look at it. JN suggested she continued to get her injection here.   I just spoke with Ebony Scott there is no difference in the price wether nucala gets  sent here or at home. (I called the pharmacy for her peace of mind.) I transferred her up front so she could leave a message with JN  discussing wether she needs to take nucala or not. She done okay without a biologic for 5 months. She's not a 100% though.

## 2016-07-29 ENCOUNTER — Encounter: Payer: Self-pay | Admitting: Pulmonary Disease

## 2016-07-30 NOTE — Telephone Encounter (Signed)
I could do Thursday July 5 in the morning at [facility]. If so go ahead and schedule it. Will need to do lavage and transbronchial biopsies with fluoro. Thanks.

## 2016-07-30 NOTE — Telephone Encounter (Signed)
JN -  Do these dates work for you to do a bronch? Thanks!

## 2016-08-01 ENCOUNTER — Ambulatory Visit (INDEPENDENT_AMBULATORY_CARE_PROVIDER_SITE_OTHER): Payer: Medicare Other | Admitting: Allergy and Immunology

## 2016-08-01 ENCOUNTER — Encounter: Payer: Self-pay | Admitting: Allergy and Immunology

## 2016-08-01 VITALS — BP 164/76 | HR 94 | Resp 18

## 2016-08-01 DIAGNOSIS — J3089 Other allergic rhinitis: Secondary | ICD-10-CM

## 2016-08-01 DIAGNOSIS — G500 Trigeminal neuralgia: Secondary | ICD-10-CM | POA: Diagnosis not present

## 2016-08-01 DIAGNOSIS — J4550 Severe persistent asthma, uncomplicated: Secondary | ICD-10-CM

## 2016-08-01 DIAGNOSIS — K219 Gastro-esophageal reflux disease without esophagitis: Secondary | ICD-10-CM | POA: Diagnosis not present

## 2016-08-01 NOTE — Patient Instructions (Addendum)
  1. 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 Continue Periactin 4 mg tablet  2 tablets at bedtime  2. 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 Continue Dexilant 60 mg in the morning  B. Continue ranitidine 300 mg in the evening  4. Continue to treat inflammation:   A. Continue Dulera 200 2 inhalations twice a day  B. Continue Nasonex one spray each nostril 1-2 times a day   C. Continue montelukast 10 mg daily  5. Continue nystatin swish and spit twice a day  6. Continue Xolair and EpiPen as prescribed by Dr. Ashok Cordia  7. Use bronchodilator and Astelin and Zyrtec if needed  8. Return to clinic in 6 months or earlier if problem

## 2016-08-01 NOTE — Progress Notes (Signed)
Follow-up Note  Referring Provider: [name], MD Primary Provider: [name], MD Date of Office Visit: 08/01/2016  Subjective:   Ebony Scott (DOB: Dec 30, 1950) is a 66 y.o. female who returns to the [facility] on 08/01/2016 in re-evaluation of the following:  HPI: Ebony Scott returns to this clinic in reevaluation of her allergic rhinoconjunctivitis, facial pain syndrome, reflux-induced respiratory disease, and asthma followed by Dr. Ashok Cordia. I have not seen her in this clinic since January 2018.  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 There is still the question of whether or not a bronchoscopy should be performed and Ebony Scott is working through that issue with Dr. Ashok Cordia.  Her asthma is under excellent control and she does not need to use a bronchodilator. She believes that she has had excellent breathing over the course of the past several months and she has discontinued her Xolair injections in January. She may actually have had improvement regarding her respiratory tract disease because she was admitted to the hospital for influenza B infection complicated by a respiratory tract pneumonia in February and received very high doses and prolonged doses of systemic steroids. She believes that her last dose of systemic steroid was somewhere towards the middle of March. She continues to use her nystatin swish and spit twice a day to prevent oral thrush while using her Dulera.  She's had very little problems with her nose. She has not been having any problems with her facial pain syndrome.  She still continues to have intermittent esophageal dysmotility manifested as obstruction that she must dislodge from her esophagus when eating rice and bread and french fries and chicken tenders about one or 2 times per month. She continues on  therapy for reflux and other than these obstructive events believes that her reflux is under very good control.  Ebony Scott developed a acute pain on the lateral aspect of her left foot yesterday while moving some furniture in her garage. She took ibuprofen and put a wrap on this area as well as ice and she is a little bit better today.  Allergies as of 08/01/2016      Reactions   Dust Mite Extract Shortness Of Breath, Other (See Comments)   "sneezing" (02/20/2012)   Molds & Smuts Shortness Of Breath   Morphine Sulfate Itching   Other Shortness Of Breath, Other (See Comments)   Grass and weeds "sneezing; filled sinuses" (02/20/2012)   Penicillins Rash, Other (See Comments)   "welts" (02/20/2012)   Reclast [zoledronic Acid]    Put in hospital   Rofecoxib Swelling   Celebrex REACTION: feet swelling   Shrimp Flavor Anaphylaxis   ALL SHELLFISH   Tetracycline Hcl Nausea And Vomiting   Dilaudid [hydromorphone Hcl] Itching   Hydrocodone-acetaminophen Nausea And Vomiting   Levofloxacin Other (See Comments)   REACTION: GI upset   Oxycodone Hcl Nausea And Vomiting   Paroxetine Nausea And Vomiting   Paxil   Diltiazem Swelling   Tree Extract Other (See Comments)   "tested and told I was allergic to it; never experienced a reaction to it" (02/20/2012)      Medication List      AEROCHAMBER MV inhaler Use as instructed   PROAIR HFA 108 (90 Base) MCG/ACT inhaler Generic drug:  albuterol Inhale 2 puffs into the lungs every 4 (four) hours as needed  for wheezing or shortness of breath.   albuterol (2.5 MG/3ML) 0.083% nebulizer solution Commonly known as:  PROVENTIL Take 3 mLs (2.5 mg total) by nebulization every 4 (four) hours as needed for wheezing or shortness of breath.   ALIGN 4 MG Caps Take 1 tablet by mouth daily. Once a day   Alpha-Lipoic Acid 600 MG Caps Take 600 mg by mouth daily. Once a day   azelastine 0.1 % nasal spray Commonly known as:  ASTELIN USE 2 SPRAYS IN EACH NOSTRIL DAILY  AS DIRECTED   BACTROBAN 2 % Generic drug:  mupirocin ointment Place 1 application into the nose as needed.   BEANO Tabs Take 1 tablet by mouth daily.   cetirizine 10 MG tablet Commonly known as:  ZYRTEC Take 1/2 tablet daily   colchicine 0.6 MG tablet TAKE 1 TABLET BY MOUTH DAILY   cyproheptadine 4 MG tablet Commonly known as:  PERIACTIN Take two tablets every evening   denosumab 60 MG/ML Soln injection Commonly known as:  PROLIA Inject 60 mg into the skin every 6 (six) months. Administer in upper arm, thigh, or abdomen   dexlansoprazole 60 MG capsule Commonly known as:  DEXILANT Take 1 capsule (60 mg total) by mouth daily.   diclofenac sodium 1 % Gel Commonly known as:  VOLTAREN Apply 2 g topically 4 (four) times daily.   EPIPEN 2-PAK IJ Inject as directed.   FLUTTER Devi Use as directed   gabapentin 600 MG tablet Commonly known as:  NEURONTIN Take 1 tablet (600 mg total) by mouth daily.   lactase 3000 units tablet Commonly known as:  LACTAID Take 3 tablets by mouth as needed. 3-4 tabs with dairy liquids and solids   lipase/protease/amylase 12000 units Cpep capsule Commonly known as:  CREON Take 1 capsule (12,000 Units total) by mouth 3 (three) times daily. CREON 24000-76000-120000 U Takes with meals   meclizine 25 MG tablet Commonly known as:  ANTIVERT Take 25 mg by mouth every 6 (six) hours as needed for dizziness.   mirtazapine 30 MG disintegrating tablet Commonly known as:  REMERON SOLTAB Take 1 tablet (30 mg total) by mouth at bedtime.   mometasone 50 MCG/ACT nasal spray Commonly known as:  NASONEX Place 2 sprays into the nose 2 (two) times daily.   mometasone-formoterol 200-5 MCG/ACT Aero Commonly known as:  DULERA Inhale 2 puffs into the lungs 2 (two) times daily.   montelukast 10 MG tablet Commonly known as:  SINGULAIR TAKE 1 TABLET BY MOUTH ONCE DAILY   MUCINEX D 60-600 MG 12 hr tablet Generic drug:  pseudoephedrine-guaifenesin Take 1  tablet by mouth daily.   MUCINEX PO Take by mouth daily.   nystatin 100000 UNIT/ML suspension Commonly known as:  MYCOSTATIN USE AS DIRECTED 5MLS IN MOUTH OR THROAT 4 TIMES DAILY   nystatin-triamcinolone ointment Commonly known as:  MYCOLOG Apply 1 application topically as needed.   pravastatin 20 MG tablet Commonly known as:  PRAVACHOL TAKE 1 TABLET BY MOUTH DAILY   PRESCRIPTION MEDICATION Place 1 capsule into both ears daily as needed. Pt has compounded capsule (powder) that she uses In her ears as needed;  CHLORAMP/SULFU/AMPHO B/ H DROC 100-100-5 mg capsule   PROCTO-MED HC 2.5 % rectal cream Generic drug:  hydrocortisone Place 2.5 Tubes rectally daily.   propylthiouracil 50 MG tablet Commonly known as:  PTU TAKE 1/2 TABLET BY MOUTH TWICE DAILY   ranitidine 300 MG tablet Commonly known as:  ZANTAC Take 1 tablet (300 mg total) by mouth daily.  SOOTHE OP Apply to eye as needed.   SPIRIVA RESPIMAT 2.5 MCG/ACT Aers Generic drug:  Tiotropium Bromide Monohydrate Inhale 2 puffs into the lungs daily.   TYLENOL ARTHRITIS EXT RELIEF PO Take 2 tablets by mouth 2 (two) times daily.   VINATE M 27-1 MG Tabs Take 1 tablet by mouth daily.   Vitamin D3 2000 units capsule 1/2 at bedtime       Past Medical History:  Diagnosis Date  . Allergic rhinitis   . Allergy    SEASONAL  . Anemia   . Anxiety    pt denies  . Arthritis    "mostly the hands" (02/20/2012)  . Asthma   . Breast cancer ([place]) 1998   in remission  . Cataract    REMOVED  . Chronic lower back pain   . Chronic neck pain   . Complication of anesthesia    "had hard time waking up from it several times" (02/20/2012)  . Depression    "some; don't take anything for it" (02/20/2012)  . Diverticulosis   . DVT (deep venous thrombosis) ([place])   . Exertional dyspnea   . Fibromyalgia 11/2011  . GERD (gastroesophageal reflux disease)   . Graves disease   . Headache(784.0)    "related to allergies; more at  different times during the year" (02/20/2012)  . Hemorrhoids   . Hiatal hernia    back and neck  . Hx of adenomatous colonic polyps 04/12/2016  . Hypercholesteremia    good cholesterol is high  . Hypothyroidism   . IBS (irritable bowel syndrome)   . Moderate persistent asthma    -FeV1 72% 2011, -IgE 102 2011, CT sinus Neg 2011  . Osteoporosis    on reclast yearly  . Pneumonia 04/2011; ~ 11/2011   "double; single" (02/20/2012)    Past Surgical History:  Procedure Laterality Date  . ANTERIOR AND POSTERIOR REPAIR  1990's  . APPENDECTOMY    . BREAST LUMPECTOMY  1998   left  . CARPOMETACARPEL ([place]) FUSION OF THUMB WITH AUTOGRAFT FROM RADIUS  ~ 2009   "both thumbs" (02/20/2012)  . CATARACT EXTRACTION W/ INTRAOCULAR LENS  IMPLANT, BILATERAL  2012  . CERVICAL DISCECTOMY  10/2001   C5-C6  . CERVICAL FUSION  2003   C3-C4  . CHOLECYSTECTOMY    . COLONOSCOPY    . DEBRIDEMENT TENNIS ELBOW  ?1970's   right  . ESOPHAGOGASTRODUODENOSCOPY    . HYSTERECTOMY    . KNEE ARTHROPLASTY  ?1990's   "?right; w/cartilage repair" (02/20/2012)  . NASAL SEPTUM SURGERY  1980's  . POSTERIOR CERVICAL FUSION/FORAMINOTOMY  2004   "failed initial fusion; rewired  anterior neck" (02/20/2012)  . TONSILLECTOMY  ~ 1953  . VESICOVAGINAL FISTULA CLOSURE W/ TAH  1988    Review of systems negative except as noted in HPI / PMHx or noted below:  Review of Systems  Constitutional: Negative.   HENT: Negative.   Eyes: Negative.   Respiratory: Negative.   Cardiovascular: Negative.   Gastrointestinal: Negative.   Genitourinary: Negative.   Musculoskeletal: Negative.   Skin: Negative.   Neurological: Negative.   Endo/Heme/Allergies: Negative.   Psychiatric/Behavioral: Negative.      Objective:   Vitals:   08/01/16 1104  BP: (!) 164/76  Pulse: 94  Resp: 18          Physical Exam  Constitutional: She is well-developed, well-nourished, and in no distress.  Slight buffalo hump  HENT:  Head: Normocephalic.    Right Ear: Tympanic membrane, external  ear and ear canal normal.  Left Ear: Tympanic membrane, external ear and ear canal normal.  Nose: Nose normal. No mucosal edema or rhinorrhea.  Mouth/Throat: Uvula is midline, oropharynx is clear and moist and mucous membranes are normal. No oropharyngeal exudate.  Eyes: Conjunctivae are normal.  Neck: Trachea normal. No tracheal tenderness present. No tracheal deviation present. No thyromegaly present.  Cardiovascular: Normal rate, regular rhythm, S1 normal, S2 normal and normal heart sounds.   No murmur heard. Pulmonary/Chest: Breath sounds normal. No stridor. No respiratory distress. She has no wheezes. She has no rales.  Musculoskeletal: She exhibits tenderness (Slight left lateral metatarsal tenderness without any obvious bony deformity or bruising). She exhibits no edema.  Lymphadenopathy:       Head (right side): No tonsillar adenopathy present.       Head (left side): No tonsillar adenopathy present.    She has no cervical adenopathy.  Neurological: She is alert. Gait normal.  Skin: No rash noted. She is not diaphoretic. No erythema. Nails show no clubbing.  Psychiatric: Mood and affect normal.    Diagnostics:    Spirometry was performed and demonstrated an FEV1 of 1.76 at 85 % of predicted.  The patient had an Asthma Control Test with the following results: ACT Total Score: 14.    Assessment and Plan:   1. Severe persistent asthma, uncomplicated   2. Other allergic rhinitis   3. LPRD (laryngopharyngeal reflux disease)   4. Facial pain syndrome     1. Continue to treat facial pain and sleep dysfunction:   A. Continue Periactin 4 mg tablet  2 tablets at bedtime  2. 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 Continue Dexilant 60 mg in the morning  B. Continue ranitidine 300 mg in the evening  4. Continue to treat inflammation:   A. Continue Dulera 200 2 inhalations twice a day  B. Continue Nasonex one spray each nostril  1-2 times a day   C. Continue montelukast 10 mg daily  5. Continue nystatin swish and spit twice a day  6. Continue Xolair and EpiPen as prescribed by Dr. Ashok Cordia  7. Use bronchodilator and Astelin and Zyrtec if needed  8. Return to clinic in 6 months or earlier if problem   Mirna appears to be doing relatively well regarding most of her medical issues and in fact her asthma is under excellent control at this point in time and even in the face of not utilizing omalizumab injections she has very good control of this issue. I suspect that her improvement comes because of her high dose and prolonged use of systemic steroids during her hospitalization in March for influenza with secondary pneumonia. The question at hand is whether or not she will require a biological agent as she goes through the remainder of this year. I informed Adelei that she should consider not going back on a biological agent and see what happens over the course of the next 6 months as she gets away from her systemic steroid administration earlier this spring. She does appear to have a small buffalo hump which is probably also a reflection of that systemic steroid use this spring. Concerning her foot, she probably has a hairline fracture of her metatarsal and she appears to be doing relatively well at this point with diminishing pain and good ability to ambulate without much difficulty while using a nonsteroidal anti-inflammatory drug and I just asked her to be very careful over the course the next 10-14 days without  torquing her foot to any large degree. Certainly if she has worsening issues with her foot she may require an x-ray and immobilization. I will see her back in this clinic in 6 months or earlier if there is a problem.  Allena Katz, MD Allergy / Immunology [place]

## 2016-08-02 ENCOUNTER — Telehealth: Payer: Self-pay | Admitting: Pulmonary Disease

## 2016-08-02 NOTE — Telephone Encounter (Signed)
See Patient Email (07/29/16). Spoke with pt. Advised her that we will contact the respiratory department in the morning as they are already closed.

## 2016-08-03 ENCOUNTER — Encounter: Payer: Self-pay | Admitting: Pulmonary Disease

## 2016-08-03 ENCOUNTER — Telehealth: Payer: Self-pay | Admitting: Pulmonary Disease

## 2016-08-03 NOTE — Telephone Encounter (Signed)
Spoke with patient, requested to speak with Tammy - call transferred.  Will send to TS to document.

## 2016-08-03 NOTE — Telephone Encounter (Signed)
Pt scheduled for bronch 08/23/16 @ 8am at [facility] Will send to [facility] to make sure time is good.  Will then need to contact the patient to make her aware

## 2016-08-03 NOTE — Telephone Encounter (Signed)
I was able to call in a verbal rx for nucala. I called pt. She is going to wait till her Arvid Right is settled for she can pursue nacala.   JN, please disregard previous ph. Note. Sorry to bother you. Have a great weekend.

## 2016-08-03 NOTE — Telephone Encounter (Signed)
I had lm with Mobile voicemail. Pt. Called back. I was letting her know I had sent a message to [facility] about a 3 month rx for her nucala. Also Kathlee Nations is working on old [facility] surgeon.

## 2016-08-03 NOTE — Telephone Encounter (Signed)
Pt aware of appt date/time. Aware of instructions pre-op.   -NPO after 12am the night before -Hold all morning meds until after procedure -Arrive about 57min-1hr prior to appt time -Please have transportation to the hospital and home as you will not be able to drive after procedure.  You may brush your teeth the morning of procedure but do not drink any of the water.  Nothing further needed.

## 2016-08-03 NOTE — Telephone Encounter (Signed)
Fine with me. Just make sure that you notify me of the confirmation after you speak with her so I can add it to my schedule. Thanks.

## 2016-08-03 NOTE — Telephone Encounter (Signed)
Randa Spike, CMA  4:35 PM  Ms. Joneen Caraway,  Dr. Ashok Cordia says that July 5th would work for him. We will contact the Respiratory Department in the morning as they are already closed.   Javier Glazier, MD  4:31 PM  Note    I could do Thursday July 5 in the morning at [facility]. If so go ahead and schedule it. Will need to do lavage and transbronchial biopsies with fluoro. Thanks.

## 2016-08-03 NOTE — Telephone Encounter (Signed)
Pt calling stating that she received a call from our office? And can be reached @ [phone] .Ebony Scott

## 2016-08-03 NOTE — Telephone Encounter (Signed)
Hey Dr. Ashok Cordia, as you may or may not remember Ebony Scott has a $100 co-pay every month.(fixed income) If you could please write a nucala rx for a 90 day supply it would save her $100  (cost $200 instead of $300) I was looking over the initial paperwork there is no check box for this option. (FYI) (100mg  vial qty of one X 3 for a 90 day supply, inj. SubQ q 28 days) I will leave encounter open until you respond. Thanks! Alroy Bailiff         I called [name] back to let her know, I asked Kathlee Nations about her Dec. & Patrecia Pace charges. Kathlee Nations said they are working on it. I explained to her it takes a while, there is a process. I told her Kathlee Nations would call her when she finds out something.

## 2016-08-10 NOTE — Telephone Encounter (Signed)
I called in the rx nucala 100 mg qty of 1 for a 28 days supply, give SubQ, 25G and 18G needles, no sterile water needed. Pt. Doesn't want to create another bill until her xolair charges are resolved. Nothing further needed at this time.

## 2016-08-16 ENCOUNTER — Encounter: Payer: Self-pay | Admitting: Family Medicine

## 2016-08-16 ENCOUNTER — Ambulatory Visit: Payer: Self-pay

## 2016-08-16 DIAGNOSIS — M79672 Pain in left foot: Secondary | ICD-10-CM

## 2016-08-17 ENCOUNTER — Ambulatory Visit (INDEPENDENT_AMBULATORY_CARE_PROVIDER_SITE_OTHER): Payer: Medicare Other

## 2016-08-17 ENCOUNTER — Ambulatory Visit
Admission: RE | Admit: 2016-08-17 | Discharge: 2016-08-17 | Disposition: A | Discharge status: Home / Self Care | Payer: Medicare Other | Source: Ambulatory Visit | Attending: Family Medicine | Admitting: Family Medicine

## 2016-08-17 DIAGNOSIS — M810 Age-related osteoporosis without current pathological fracture: Secondary | ICD-10-CM | POA: Diagnosis not present

## 2016-08-17 DIAGNOSIS — M79672 Pain in left foot: Secondary | ICD-10-CM

## 2016-08-17 MED ORDER — DENOSUMAB 60 MG/ML ~~LOC~~ SOLN
60.0000 mg | Freq: Once | SUBCUTANEOUS | Status: AC
  Administered 2016-08-17: 60 mg via SUBCUTANEOUS

## 2016-08-17 NOTE — Progress Notes (Signed)
Pt received prolia injection in her right arm subcutaneous. Pt tolerated well

## 2016-08-20 ENCOUNTER — Encounter: Payer: Self-pay | Admitting: Pulmonary Disease

## 2016-08-20 ENCOUNTER — Ambulatory Visit (INDEPENDENT_AMBULATORY_CARE_PROVIDER_SITE_OTHER): Payer: Medicare Other | Admitting: Family Medicine

## 2016-08-20 ENCOUNTER — Encounter: Payer: Self-pay | Admitting: Family Medicine

## 2016-08-20 VITALS — BP 132/68 | HR 78 | Temp 98.2°F | Resp 14 | Ht 62.0 in | Wt 127.0 lb

## 2016-08-20 DIAGNOSIS — I10 Essential (primary) hypertension: Secondary | ICD-10-CM

## 2016-08-20 DIAGNOSIS — E0590 Thyrotoxicosis, unspecified without thyrotoxic crisis or storm: Secondary | ICD-10-CM

## 2016-08-20 DIAGNOSIS — Z0000 Encounter for general adult medical examination without abnormal findings: Secondary | ICD-10-CM

## 2016-08-20 DIAGNOSIS — E7800 Pure hypercholesterolemia, unspecified: Secondary | ICD-10-CM | POA: Diagnosis not present

## 2016-08-20 DIAGNOSIS — M810 Age-related osteoporosis without current pathological fracture: Secondary | ICD-10-CM

## 2016-08-20 LAB — CBC WITH DIFFERENTIAL/PLATELET
Basophils Absolute: 0 cells/uL (ref 0–200)
Basophils Relative: 0 %
Eosinophils Absolute: 237 cells/uL (ref 15–500)
Eosinophils Relative: 3 %
HCT: 38.1 % (ref 35.0–45.0)
Hemoglobin: 12.7 g/dL (ref 12.0–15.0)
Lymphocytes Relative: 30 %
Lymphs Abs: 2370 cells/uL (ref 850–3900)
MCH: 33.5 pg — ABNORMAL HIGH (ref 27.0–33.0)
MCHC: 33.3 g/dL (ref 32.0–36.0)
MCV: 100.5 fL — ABNORMAL HIGH (ref 80.0–100.0)
MPV: 10.1 fL (ref 7.5–12.5)
Monocytes Absolute: 1264 cells/uL — ABNORMAL HIGH (ref 200–950)
Monocytes Relative: 16 %
Neutro Abs: 4029 cells/uL (ref 1500–7800)
Neutrophils Relative %: 51 %
Platelets: 260 10*3/uL (ref 140–400)
RBC: 3.79 MIL/uL — ABNORMAL LOW (ref 3.80–5.10)
RDW: 13.5 % (ref 11.0–15.0)
WBC: 7.9 10*3/uL (ref 3.8–10.8)

## 2016-08-20 LAB — COMPLETE METABOLIC PANEL WITH GFR
ALT: 35 U/L — ABNORMAL HIGH (ref 6–29)
AST: 30 U/L (ref 10–35)
Albumin: 4.1 g/dL (ref 3.6–5.1)
Alkaline Phosphatase: 36 U/L (ref 33–130)
BUN: 19 mg/dL (ref 7–25)
CO2: 23 mmol/L (ref 20–31)
Calcium: 9.6 mg/dL (ref 8.6–10.4)
Chloride: 108 mmol/L (ref 98–110)
Creat: 1.42 mg/dL — ABNORMAL HIGH (ref 0.50–0.99)
GFR, Est African American: 44 mL/min — ABNORMAL LOW (ref >=60)
GFR, Est Non African American: 38 mL/min — ABNORMAL LOW (ref >=60)
Glucose, Bld: 77 mg/dL (ref 70–99)
Potassium: 3.9 mmol/L (ref 3.5–5.3)
Sodium: 143 mmol/L (ref 135–146)
Total Bilirubin: 0.6 mg/dL (ref 0.2–1.2)
Total Protein: 6.5 g/dL (ref 6.1–8.1)

## 2016-08-20 LAB — TSH: TSH: 2.05 mIU/L

## 2016-08-20 LAB — LIPID PANEL
Cholesterol: 242 mg/dL — ABNORMAL HIGH (ref <200)
HDL: 83 mg/dL (ref >50)
LDL Cholesterol: 132 mg/dL — ABNORMAL HIGH (ref <100)
Total CHOL/HDL Ratio: 2.9 Ratio (ref <5.0)
Triglycerides: 136 mg/dL (ref <150)
VLDL: 27 mg/dL (ref <30)

## 2016-08-20 NOTE — Progress Notes (Signed)
Subjective:    Patient ID: Ebony Scott, female    DOB: [birth year], 66 y.o.   MRN: [id number]  HPI Patient is here today for complete physical exam. Her most recent colonoscopy was performed in February and was significant for 3 tubular adenomas. Repeat colonoscopy is recommended in 3-5 years. Her mammogram is scheduled for August with her GYN. Past medical history significant for hysterectomy and therefore she does not require a Pap smear. Bone density test is due and scheduled for later this year.  Is on prolia.  Recently suffered a stress fracture to her left metatarsal.   Immunizations are up-to-date.: Immunization History  Administered Date(s) Administered  . DTaP 08/18/2013  . Hepatitis A 09/04/2007, 03/02/2008  . Hepatitis B 01/07/1985, 02/06/1985, 08/17/1985  . Influenza Split 11/13/2010, 11/22/2011, 10/20/2012  . Influenza Whole 11/14/2009, 11/21/2011  . Influenza, High Dose Seasonal PF 11/09/2015  . Influenza,inj,Quad PF,36+ Mos 11/06/2013, 11/09/2014  . Meningococcal Conjugate 09/04/2007  . Pneumococcal Conjugate-13 05/18/2013  . Pneumococcal Polysaccharide-23 01/05/1994, 11/28/2011  . Td 07/24/1995, 03/16/2005  . Tdap 08/18/2013  . Zoster 03/02/2008   Past Medical History:  Diagnosis Date  . Allergic rhinitis   . Allergy    SEASONAL  . Anemia   . Anxiety    pt denies  . Arthritis    "mostly the hands" (02/20/2012)  . Asthma   . Breast cancer ([place]) 1998   in remission  . Cataract    REMOVED  . Chronic lower back pain   . Chronic neck pain   . Complication of anesthesia    "had hard time waking up from it several times" (02/20/2012)  . Depression    "some; don't take anything for it" (02/20/2012)  . Diverticulosis   . DVT (deep venous thrombosis) ([place])   . Exertional dyspnea   . Fibromyalgia 11/2011  . GERD (gastroesophageal reflux disease)   . Graves disease   . Headache(784.0)    "related to allergies; more at different times during the year" (02/20/2012)  .  Hemorrhoids   . Hiatal hernia    back and neck  . Hx of adenomatous colonic polyps 04/12/2016  . Hypercholesteremia    good cholesterol is high  . Hypothyroidism   . IBS (irritable bowel syndrome)   . Moderate persistent asthma    -FeV1 72% 2011, -IgE 102 2011, CT sinus Neg 2011  . Osteoporosis    on reclast yearly  . Pneumonia 04/2011; ~ 11/2011   "double; single" (02/20/2012)   Past Surgical History:  Procedure Laterality Date  . ANTERIOR AND POSTERIOR REPAIR  1990's  . APPENDECTOMY    . BREAST LUMPECTOMY  1998   left  . CARPOMETACARPEL ([place]) FUSION OF THUMB WITH AUTOGRAFT FROM RADIUS  ~ 2009   "both thumbs" (02/20/2012)  . CATARACT EXTRACTION W/ INTRAOCULAR LENS  IMPLANT, BILATERAL  2012  . CERVICAL DISCECTOMY  10/2001   C5-C6  . CERVICAL FUSION  2003   C3-C4  . CHOLECYSTECTOMY    . COLONOSCOPY    . DEBRIDEMENT TENNIS ELBOW  ?1970's   right  . ESOPHAGOGASTRODUODENOSCOPY    . HYSTERECTOMY    . KNEE ARTHROPLASTY  ?1990's   "?right; w/cartilage repair" (02/20/2012)  . NASAL SEPTUM SURGERY  1980's  . POSTERIOR CERVICAL FUSION/FORAMINOTOMY  2004   "failed initial fusion; rewired  anterior neck" (02/20/2012)  . TONSILLECTOMY  ~ 1953  . VESICOVAGINAL FISTULA CLOSURE W/ TAH  1988   Current Outpatient Prescriptions on File Prior to Visit  Medication  Sig Dispense Refill  . acetaminophen (TYLENOL) 650 MG CR tablet Take 1,300 mg by mouth 2 (two) times daily.    Marland Kitchen albuterol (PROAIR HFA) 108 (90 Base) MCG/ACT inhaler Inhale 2 puffs into the lungs every 4 (four) hours as needed for wheezing or shortness of breath.    . Alpha-D-Galactosidase (BEANO PO) Take 1 capsule by mouth 3 (three) times daily with meals.    . Alpha-Lipoic Acid 600 MG CAPS Take 600 mg by mouth daily.     Marland Kitchen alum & mag hydroxide-simeth (GELUSIL) 200-200-25 MG chewable tablet Chew 1 tablet by mouth every 6 (six) hours as needed for flatulence.    Marland Kitchen azelastine (ASTELIN) 0.1 % nasal spray USE 2 SPRAYS IN EACH NOSTRIL DAILY  AS DIRECTED (Patient taking differently: USE 1 SPRAYS IN EACH NOSTRIL AT BEDTIME) 30 mL 5  . cetirizine (ZYRTEC) 10 MG tablet Take 5 mg by mouth every evening.     . Cholecalciferol (VITAMIN D3) 10000 units TABS Take 1,000 Units by mouth daily.    . colchicine 0.6 MG tablet TAKE 1 TABLET BY MOUTH DAILY (Patient taking differently: TAKE 1 TABLET BY MOUTH EVERY EVENING) 90 tablet 1  . cyproheptadine (PERIACTIN) 4 MG tablet Take two tablets every evening (Patient taking differently: Take 8 mg by mouth every evening. ) 60 tablet 5  . denosumab (PROLIA) 60 MG/ML SOLN injection Inject 60 mg into the skin every 6 (six) months. Administer in upper arm, thigh, or abdomen    . dexlansoprazole (DEXILANT) 60 MG capsule Take 1 capsule (60 mg total) by mouth daily. 30 capsule 5  . diclofenac sodium (VOLTAREN) 1 % GEL Apply 2 g topically 4 (four) times daily. (Patient taking differently: Apply 2 g topically 4 (four) times daily as needed (PAIN). ) 1 Tube 0  . EPINEPHrine (EPIPEN 2-PAK IJ) Inject as directed.    . gabapentin (NEURONTIN) 600 MG tablet Take 1 tablet (600 mg total) by mouth daily. 90 tablet 3  . guaifenesin (HUMIBID E) 400 MG TABS tablet Take 400 mg by mouth 2 (two) times daily.    . Lactase (LACTAID FAST ACT) 9000 units TABS Take 18,000 Units by mouth 4 (four) times daily as needed (DAIRY).    Marland Kitchen lipase/protease/amylase (CREON) 12000 units CPEP capsule Take 1 capsule (12,000 Units total) by mouth 3 (three) times daily. CREON 24000-76000-120000 U Takes with meals 270 capsule 5  . meclizine (ANTIVERT) 25 MG tablet Take 25 mg by mouth every 6 (six) hours as needed for dizziness.    . mirtazapine (REMERON SOLTAB) 30 MG disintegrating tablet Take 1 tablet (30 mg total) by mouth at bedtime. 30 tablet 5  . mometasone (NASONEX) 50 MCG/ACT nasal spray Place 2 sprays into the nose 2 (two) times daily. 17 g 0  . mometasone-formoterol (DULERA) 200-5 MCG/ACT AERO Inhale 2 puffs into the lungs 2 (two) times daily. 1  Inhaler 5  . montelukast (SINGULAIR) 10 MG tablet TAKE 1 TABLET BY MOUTH ONCE DAILY 30 tablet 11  . Multiple Minerals-Vitamins (CALCIUM CITRATE PLUS/MAGNESIUM PO) Take 1 tablet by mouth 2 (two) times daily.    . mupirocin ointment (BACTROBAN) 2 % Place 1 application into the nose 3 (three) times daily as needed (DRY NOSE).     Marland Kitchen neomycin-bacitracin-polymyxin (NEOSPORIN) ointment Apply 1 application topically 3 (three) times daily as needed for wound care. apply to eye    . nystatin (MYCOSTATIN) 100000 UNIT/ML suspension USE AS DIRECTED 5MLS IN MOUTH OR THROAT 4 TIMES DAILY (Patient taking differently: USE  AS DIRECTED 5MLS IN MOUTH OR THROAT EVERY EVENING) 473 mL 2  . nystatin-triamcinolone ointment (MYCOLOG) Apply 1 application topically 3 (three) times daily as needed (YEAST INFECTION).     Marland Kitchen Pancrelipase, Lip-Prot-Amyl, 24000-76000 units CPEP Take 1 capsule by mouth See admin instructions. Takes 1 capsule 3 times daily with meals and occasionally takes 1 capsule with large snacks  24000-76000-120000    . pravastatin (PRAVACHOL) 20 MG tablet TAKE 1 TABLET BY MOUTH DAILY 90 tablet 3  . Prenatal Vit-Sel-Fe Fum-FA (VINATE M) 27-1 MG TABS Take 1 tablet by mouth daily. 30 each 11  . PRESCRIPTION MEDICATION Place 2 puffs into both ears daily as needed. Pt has compounded capsule (powder) that she uses In her ears as needed; CS (X) AHCL    CHLORAMP/SULFU/AMPHO B/ H DROC 100-100-5 mg capsule    . Probiotic Product (ALIGN) 4 MG CAPS Take 1 tablet by mouth daily.     Marland Kitchen PROCTO-MED HC 2.5 % rectal cream Place 1 application rectally 4 (four) times daily as needed for hemorrhoids or itching.     Marland Kitchen Propylene Glycol-Glycerin (SOOTHE OP) Place 1 drop into both eyes 2 (two) times daily.     Marland Kitchen propylthiouracil (PTU) 50 MG tablet TAKE 1/2 TABLET BY MOUTH TWICE DAILY 30 tablet 3  . ranitidine (ZANTAC) 300 MG tablet Take 1 tablet (300 mg total) by mouth daily. (Patient taking differently: Take 300 mg by mouth every  evening. ) 30 tablet 5  . Respiratory Therapy Supplies (FLUTTER) DEVI Use as directed 1 each 0  . SIMPLY SALINE NA Place 1 spray into both nostrils at bedtime.    Marland Kitchen Spacer/Aero-Holding Chambers (AEROCHAMBER MV) inhaler Use as instructed 1 each 0  . Tiotropium Bromide Monohydrate (SPIRIVA RESPIMAT) 2.5 MCG/ACT AERS Inhale 2 puffs into the lungs daily.    . [DISCONTINUED] methimazole (TAPAZOLE) 5 MG tablet Take 5 mg by mouth 3 (three) times daily.       No current facility-administered medications on file prior to visit.    Allergies  Allergen Reactions  . Dust Mite Extract Shortness Of Breath and Other (See Comments)    "sneezing" (02/20/2012)  . Molds & Smuts Shortness Of Breath  . Morphine Sulfate Itching  . Other Shortness Of Breath and Other (See Comments)    Grass and weeds "sneezing; filled sinuses" (02/20/2012)  . Penicillins Rash and Other (See Comments)    "welts" (02/20/2012) Has patient had a PCN reaction causing immediate rash, facial/tongue/throat swelling, SOB or lightheadedness with hypotension: Unknown Has patient had a PCN reaction causing severe rash involving mucus membranes or skin necrosis: No Has patient had a PCN reaction that required hospitalization: No Has patient had a PCN reaction occurring within the last 10 years: No If all of the above answers are "NO", then may proceed with Cephalosporin use.   Marland Kitchen Reclast [Zoledronic Acid] Other (See Comments)    Fever, Put in hospital, dr said it was a reaction from a reaction   . Rofecoxib Swelling    Celebrex/vioxx REACTION: feet swelling  . Shrimp Flavor Anaphylaxis    ALL SHELLFISH  . Tetracycline Hcl Nausea And Vomiting  . Xolair [Omalizumab] Other (See Comments)    Caused Blood clot  . Dilaudid [Hydromorphone Hcl] Itching  . Hydrocodone-Acetaminophen Nausea And Vomiting  . Levofloxacin Other (See Comments)    REACTION: GI upset  . Oxycodone Hcl Nausea And Vomiting  . Paroxetine Nausea And Vomiting    Paxil     . Diltiazem Swelling  .  Tree Extract Other (See Comments)    "tested and told I was allergic to it; never experienced a reaction to it" (02/20/2012)   Social History   Social History  . Marital status: Divorced    Spouse name: N/A  . Number of children: 2  . Years of education: college   Occupational History  . Disabled     Retired Print production planner  .  Retired   Social History Main Topics  . Smoking status: Passive Smoke Exposure - Never Smoker  . Smokeless tobacco: Never Used     Comment: Parents  . Alcohol use 0.0 oz/week     Comment: 02/20/2012 "couple glasses of wine/6 months"  . Drug use: No  . Sexual activity: No   Other Topics Concern  . Not on file   Social History Narrative   Patient lives at home alone. Patient  divorced.    Patient has her BS degree.   Right handed.   Caffeine- sometimes coffee.      [facility]:   Born in [place], [place]. She worked as a Copywriter, advertising. She has no pets currently. She does have indoor plants. Previously had mold in her home that was remediated. Carpet was removed.             Family History  Problem Relation Age of Onset  . Allergies Mother   . Heart disease Mother   . Arthritis Mother   . Lung cancer Mother   . Diabetes Mother   . Allergies Father   . Heart disease Father   . Arthritis Father   . Stroke Father   . Colon cancer Unknown        Maternal half aunt/Maternal half uncle  . Colitis Daughter   . Diabetes Maternal Grandfather       Review of Systems  All other systems reviewed and are negative.      Objective:   Physical Exam  Constitutional: She is oriented to person, place, and time. She appears well-developed and well-nourished.  HENT:  Head: Normocephalic and atraumatic.  Right Ear: External ear normal.  Left Ear: External ear normal.  Nose: Nose normal.  Mouth/Throat: Oropharynx is clear and moist. No oropharyngeal exudate.  Eyes: Conjunctivae and EOM are normal.  Pupils are equal, round, and reactive to light. Right eye exhibits no discharge. Left eye exhibits no discharge. No scleral icterus.  Neck: Normal range of motion. Neck supple. No JVD present. No tracheal deviation present. No thyromegaly present.  Cardiovascular: Normal rate, regular rhythm, normal heart sounds and intact distal pulses.  Exam reveals no gallop and no friction rub.   No murmur heard. Pulmonary/Chest: Effort normal and breath sounds normal. No stridor. No respiratory distress. She has no wheezes. She has no rales. She exhibits no tenderness.  Abdominal: Soft. Bowel sounds are normal. She exhibits no distension and no mass. There is no tenderness. There is no rebound and no guarding.  Musculoskeletal: Normal range of motion. She exhibits no edema or tenderness.  Lymphadenopathy:    She has no cervical adenopathy.  Neurological: She is alert and oriented to person, place, and time. She has normal reflexes. No cranial nerve deficit. She exhibits normal muscle tone. Coordination normal.  Skin: Skin is warm. No rash noted. She is not diaphoretic. No erythema. No pallor.  Psychiatric: She has a normal mood and affect. Her behavior is normal. Judgment and thought content normal.  Vitals reviewed.         Assessment &  Plan:  Pure hypercholesterolemia - Plan: CBC with Differential/Platelet, COMPLETE METABOLIC PANEL WITH GFR, Lipid panel  Hyperthyroidism - Plan: TSH  Essential hypertension  Osteoporosis, unspecified osteoporosis type, unspecified pathological fracture presence  General medical exam  Physical exam today is normal. Regular anticipatory guidance is provided. Cancer screening is up-to-date. Immunizations are up-to-date. I will check a CBC, CMP, fasting lipid panel. I will monitor her TSH and her treatment for hyperthyroidism.  Her goal LDL cholesterol is less than 130. Her blood pressure today is well controlled. Mammogram is artery been scheduled. Bone density test  as early been scheduled. His not require Pap smear. Colonoscopy is up-to-date.

## 2016-08-21 ENCOUNTER — Other Ambulatory Visit: Payer: Self-pay | Admitting: Family Medicine

## 2016-08-21 ENCOUNTER — Encounter: Payer: Self-pay | Admitting: Family Medicine

## 2016-08-21 ENCOUNTER — Telehealth: Payer: Self-pay | Admitting: Family Medicine

## 2016-08-21 DIAGNOSIS — N289 Disorder of kidney and ureter, unspecified: Secondary | ICD-10-CM

## 2016-08-21 DIAGNOSIS — R7989 Other specified abnormal findings of blood chemistry: Secondary | ICD-10-CM

## 2016-08-21 DIAGNOSIS — R945 Abnormal results of liver function studies: Secondary | ICD-10-CM

## 2016-08-21 MED ORDER — LIDOCAINE 5 % EX PTCH: 1.0000 | MEDICATED_PATCH | CUTANEOUS | 3 refills | Status: DC

## 2016-08-21 NOTE — Telephone Encounter (Signed)
Called and spoke to pt and its $200 for 90 patches and she would like it to go to [facility]. Med sent.

## 2016-08-21 NOTE — Telephone Encounter (Signed)
Patient says this is very important  She says please send 200 lidocane patches to [facility]  [phone]

## 2016-08-23 ENCOUNTER — Encounter (HOSPITAL_COMMUNITY): Payer: Self-pay

## 2016-08-23 ENCOUNTER — Ambulatory Visit (HOSPITAL_COMMUNITY)
Admission: RE | Admit: 2016-08-23 | Discharge: 2016-08-23 | Disposition: A | Discharge status: Home / Self Care | Payer: Medicare Other | Source: Ambulatory Visit | Attending: Pulmonary Disease | Admitting: Pulmonary Disease

## 2016-08-23 ENCOUNTER — Encounter (HOSPITAL_COMMUNITY)
Admission: RE | Disposition: A | Discharge status: Home / Self Care | Payer: Self-pay | Source: Ambulatory Visit | Attending: Pulmonary Disease

## 2016-08-23 DIAGNOSIS — F329 Major depressive disorder, single episode, unspecified: Secondary | ICD-10-CM | POA: Insufficient documentation

## 2016-08-23 DIAGNOSIS — J4550 Severe persistent asthma, uncomplicated: Secondary | ICD-10-CM | POA: Insufficient documentation

## 2016-08-23 DIAGNOSIS — Z880 Allergy status to penicillin: Secondary | ICD-10-CM | POA: Diagnosis not present

## 2016-08-23 DIAGNOSIS — Z853 Personal history of malignant neoplasm of breast: Secondary | ICD-10-CM | POA: Insufficient documentation

## 2016-08-23 DIAGNOSIS — R918 Other nonspecific abnormal finding of lung field: Secondary | ICD-10-CM | POA: Diagnosis not present

## 2016-08-23 DIAGNOSIS — K219 Gastro-esophageal reflux disease without esophagitis: Secondary | ICD-10-CM | POA: Insufficient documentation

## 2016-08-23 DIAGNOSIS — Z86718 Personal history of other venous thrombosis and embolism: Secondary | ICD-10-CM | POA: Insufficient documentation

## 2016-08-23 DIAGNOSIS — E039 Hypothyroidism, unspecified: Secondary | ICD-10-CM | POA: Insufficient documentation

## 2016-08-23 DIAGNOSIS — M797 Fibromyalgia: Secondary | ICD-10-CM | POA: Insufficient documentation

## 2016-08-23 DIAGNOSIS — F419 Anxiety disorder, unspecified: Secondary | ICD-10-CM | POA: Diagnosis not present

## 2016-08-23 DIAGNOSIS — Z79899 Other long term (current) drug therapy: Secondary | ICD-10-CM | POA: Insufficient documentation

## 2016-08-23 HISTORY — PX: VIDEO BRONCHOSCOPY: SHX5072

## 2016-08-23 LAB — BODY FLUID CELL COUNT WITH DIFFERENTIAL
Lymphs, Fluid: 17 %
Lymphs, Fluid: 18 %
Monocyte-Macrophage-Serous Fluid: 56 % (ref 50–90)
Monocyte-Macrophage-Serous Fluid: 81 % (ref 50–90)
Neutrophil Count, Fluid: 1 % (ref 0–25)
Neutrophil Count, Fluid: 27 % — ABNORMAL HIGH (ref 0–25)
Total Nucleated Cell Count, Fluid: 54 cu mm (ref 0–1000)
Total Nucleated Cell Count, Fluid: 77 cu mm (ref 0–1000)

## 2016-08-23 SURGERY — BRONCHOSCOPY, WITH FLUOROSCOPY
Anesthesia: Moderate Sedation | Laterality: Bilateral

## 2016-08-23 MED ORDER — FENTANYL CITRATE (PF) 100 MCG/2ML IJ SOLN
INTRAMUSCULAR | Status: AC
  Filled 2016-08-23: qty 4

## 2016-08-23 MED ORDER — SODIUM CHLORIDE 0.9 % IV SOLN: Freq: Once | INTRAVENOUS | Status: DC

## 2016-08-23 MED ORDER — SODIUM CHLORIDE 0.9 % IV SOLN
Freq: Once | INTRAVENOUS | Status: AC
  Administered 2016-08-23: 10 mL/h via INTRAVENOUS

## 2016-08-23 MED ORDER — MIDAZOLAM HCL 10 MG/2ML IJ SOLN
INTRAMUSCULAR | Status: DC | PRN
  Administered 2016-08-23: 1 mg via INTRAVENOUS
  Administered 2016-08-23 (×2): 2 mg via INTRAVENOUS

## 2016-08-23 MED ORDER — LIDOCAINE HCL 1 % IJ SOLN
INTRAMUSCULAR | Status: DC | PRN
  Administered 2016-08-23: 10 mL

## 2016-08-23 MED ORDER — MIDAZOLAM HCL 5 MG/ML IJ SOLN
INTRAMUSCULAR | Status: AC
  Filled 2016-08-23: qty 2

## 2016-08-23 NOTE — Telephone Encounter (Signed)
Ebony Scott is due for pneumovax 23 after October

## 2016-08-23 NOTE — H&P (Signed)
[facility] Consult  Date of Procedure:  08/23/2016  Reason for Consult/Chief Complaint:  Severe, Persistent Asthma w/ Lung Nodules  History of Presenting Illness:  66 y.o. female with severe, persistent asthma. It has persisted cough or dyspnea. No chest pain or pressure. No belly pain or nausea. No diarrhea. No headache or vision changes.  Review of Systems:  No fever or chills. No rashes or bruising. A pertinent 14 point review of systems is negative except as per the history of presenting illness.  Allergies  Allergen Reactions  . Dust Mite Extract Shortness Of Breath and Other (See Comments)    "sneezing" (02/20/2012)  . Molds & Smuts Shortness Of Breath  . Morphine Sulfate Itching  . Other Shortness Of Breath and Other (See Comments)    Grass and weeds "sneezing; filled sinuses" (02/20/2012)  . Penicillins Rash and Other (See Comments)    "welts" (02/20/2012) Has patient had a PCN reaction causing immediate rash, facial/tongue/throat swelling, SOB or lightheadedness with hypotension: Unknown Has patient had a PCN reaction causing severe rash involving mucus membranes or skin necrosis: No Has patient had a PCN reaction that required hospitalization: No Has patient had a PCN reaction occurring within the last 10 years: No If all of the above answers are "NO", then may proceed with Cephalosporin use.   Marland Kitchen Reclast [Zoledronic Acid] Other (See Comments)    Fever, Put in hospital, dr said it was a reaction from a reaction   . Rofecoxib Swelling    Celebrex/vioxx REACTION: feet swelling  . Shrimp Flavor Anaphylaxis    ALL SHELLFISH  . Tetracycline Hcl Nausea And Vomiting  . Xolair [Omalizumab] Other (See Comments)    Caused Blood clot  . Dilaudid [Hydromorphone Hcl] Itching  . Hydrocodone-Acetaminophen Nausea And Vomiting  . Levofloxacin Other (See Comments)    REACTION: GI upset  . Oxycodone Hcl Nausea And Vomiting  . Paroxetine Nausea And Vomiting    Paxil   . Diltiazem Swelling  . Tree Extract Other (See Comments)    "tested and told I was allergic to it; never experienced a reaction to it" (02/20/2012)    No current facility-administered medications on file prior to encounter.    Current Outpatient Prescriptions on File Prior to Encounter  Medication Sig Dispense Refill  . Alpha-Lipoic Acid 600 MG CAPS Take 600 mg by mouth daily.     Marland Kitchen azelastine (ASTELIN) 0.1 % nasal spray USE 2 SPRAYS IN EACH NOSTRIL DAILY AS DIRECTED (Patient taking differently: USE 1 SPRAYS IN EACH NOSTRIL AT BEDTIME) 30 mL 5  . cetirizine (ZYRTEC) 10 MG tablet Take 5 mg by mouth every evening.     . colchicine 0.6 MG tablet TAKE 1 TABLET BY MOUTH DAILY (Patient taking differently: TAKE 1 TABLET BY MOUTH EVERY EVENING) 90 tablet 1  . cyproheptadine (PERIACTIN) 4 MG tablet Take two tablets every evening (Patient taking differently: Take 8 mg by mouth every evening. ) 60 tablet 5  . denosumab (PROLIA) 60 MG/ML SOLN injection Inject 60 mg into the skin every 6 (six) months. Administer in upper arm, thigh, or abdomen    . dexlansoprazole (DEXILANT) 60 MG capsule Take 1 capsule (60 mg total) by mouth daily. 30 capsule 5  . gabapentin (NEURONTIN) 600 MG tablet Take 1 tablet (600 mg total) by mouth daily. 90 tablet 3  . mirtazapine (REMERON SOLTAB) 30 MG disintegrating tablet Take 1 tablet (30 mg total) by mouth at bedtime. 30 tablet 5  . mometasone (NASONEX)  50 MCG/ACT nasal spray Place 2 sprays into the nose 2 (two) times daily. 17 g 0  . mometasone-formoterol (DULERA) 200-5 MCG/ACT AERO Inhale 2 puffs into the lungs 2 (two) times daily. 1 Inhaler 5  . montelukast (SINGULAIR) 10 MG tablet TAKE 1 TABLET BY MOUTH ONCE DAILY 30 tablet 11  . nystatin (MYCOSTATIN) 100000 UNIT/ML suspension USE AS DIRECTED 5MLS IN MOUTH OR THROAT 4 TIMES DAILY (Patient taking differently: USE AS DIRECTED 5MLS IN MOUTH OR THROAT EVERY EVENING) 473 mL 2  . pravastatin (PRAVACHOL) 20 MG tablet TAKE 1  TABLET BY MOUTH DAILY 90 tablet 3  . Prenatal Vit-Sel-Fe Fum-FA (VINATE M) 27-1 MG TABS Take 1 tablet by mouth daily. 30 each 11  . PRESCRIPTION MEDICATION Place 2 puffs into both ears daily as needed. Pt has compounded capsule (powder) that she uses In her ears as needed; CS (X) AHCL    CHLORAMP/SULFU/AMPHO B/ H DROC 100-100-5 mg capsule    . Probiotic Product (ALIGN) 4 MG CAPS Take 1 tablet by mouth daily.     Marland Kitchen PROCTO-MED HC 2.5 % rectal cream Place 1 application rectally 4 (four) times daily as needed for hemorrhoids or itching.     Marland Kitchen Propylene Glycol-Glycerin (SOOTHE OP) Place 1 drop into both eyes 2 (two) times daily.     Marland Kitchen propylthiouracil (PTU) 50 MG tablet TAKE 1/2 TABLET BY MOUTH TWICE DAILY 30 tablet 3  . ranitidine (ZANTAC) 300 MG tablet Take 1 tablet (300 mg total) by mouth daily. (Patient taking differently: Take 300 mg by mouth every evening. ) 30 tablet 5  . Tiotropium Bromide Monohydrate (SPIRIVA RESPIMAT) 2.5 MCG/ACT AERS Inhale 2 puffs into the lungs daily.    Marland Kitchen albuterol (PROAIR HFA) 108 (90 Base) MCG/ACT inhaler Inhale 2 puffs into the lungs every 4 (four) hours as needed for wheezing or shortness of breath.    . diclofenac sodium (VOLTAREN) 1 % GEL Apply 2 g topically 4 (four) times daily. (Patient taking differently: Apply 2 g topically 4 (four) times daily as needed (PAIN). ) 1 Tube 0  . EPINEPHrine (EPIPEN 2-PAK IJ) Inject as directed.    . lipase/protease/amylase (CREON) 12000 units CPEP capsule Take 1 capsule (12,000 Units total) by mouth 3 (three) times daily. CREON 24000-76000-120000 U Takes with meals 270 capsule 5  . meclizine (ANTIVERT) 25 MG tablet Take 25 mg by mouth every 6 (six) hours as needed for dizziness.    . mupirocin ointment (BACTROBAN) 2 % Place 1 application into the nose 3 (three) times daily as needed (DRY NOSE).     Marland Kitchen nystatin-triamcinolone ointment (MYCOLOG) Apply 1 application topically 3 (three) times daily as needed (YEAST INFECTION).     Marland Kitchen  Respiratory Therapy Supplies (FLUTTER) DEVI Use as directed 1 each 0  . Spacer/Aero-Holding Chambers (AEROCHAMBER MV) inhaler Use as instructed 1 each 0  . [DISCONTINUED] methimazole (TAPAZOLE) 5 MG tablet Take 5 mg by mouth 3 (three) times daily.        Past Medical History:  Diagnosis Date  . Allergic rhinitis   . Allergy    SEASONAL  . Anemia   . Anxiety    pt denies  . Arthritis    "mostly the hands" (02/20/2012)  . Asthma   . Breast cancer ([place]) 1998   in remission  . Cataract    REMOVED  . Chronic lower back pain   . Chronic neck pain   . Complication of anesthesia    "had hard time waking up from  it several times" (02/20/2012)  . Depression    "some; don't take anything for it" (02/20/2012)  . Diverticulosis   . DVT (deep venous thrombosis) ([place])   . Exertional dyspnea   . Fibromyalgia 11/2011  . GERD (gastroesophageal reflux disease)   . Graves disease   . Headache(784.0)    "related to allergies; more at different times during the year" (02/20/2012)  . Hemorrhoids   . Hiatal hernia    back and neck  . Hx of adenomatous colonic polyps 04/12/2016  . Hypercholesteremia    good cholesterol is high  . Hypothyroidism   . IBS (irritable bowel syndrome)   . Moderate persistent asthma    -FeV1 72% 2011, -IgE 102 2011, CT sinus Neg 2011  . Osteoporosis    on reclast yearly  . Pneumonia 04/2011; ~ 11/2011   "double; single" (02/20/2012)    Past Surgical History:  Procedure Laterality Date  . ANTERIOR AND POSTERIOR REPAIR  1990's  . APPENDECTOMY    . BREAST LUMPECTOMY  1998   left  . CARPOMETACARPEL ([place]) FUSION OF THUMB WITH AUTOGRAFT FROM RADIUS  ~ 2009   "both thumbs" (02/20/2012)  . CATARACT EXTRACTION W/ INTRAOCULAR LENS  IMPLANT, BILATERAL  2012  . CERVICAL DISCECTOMY  10/2001   C5-C6  . CERVICAL FUSION  2003   C3-C4  . CHOLECYSTECTOMY    . COLONOSCOPY    . DEBRIDEMENT TENNIS ELBOW  ?1970's   right  . ESOPHAGOGASTRODUODENOSCOPY    . HYSTERECTOMY    . KNEE  ARTHROPLASTY  ?1990's   "?right; w/cartilage repair" (02/20/2012)  . NASAL SEPTUM SURGERY  1980's  . POSTERIOR CERVICAL FUSION/FORAMINOTOMY  2004   "failed initial fusion; rewired  anterior neck" (02/20/2012)  . TONSILLECTOMY  ~ 1953  . VESICOVAGINAL FISTULA CLOSURE W/ TAH  1988    Family History  Problem Relation Age of Onset  . Allergies Mother   . Heart disease Mother   . Arthritis Mother   . Lung cancer Mother   . Diabetes Mother   . Allergies Father   . Heart disease Father   . Arthritis Father   . Stroke Father   . Colon cancer Unknown        Maternal half aunt/Maternal half uncle  . Colitis Daughter   . Diabetes Maternal Grandfather     Social History   Social History  . Marital status: Divorced    Spouse name: N/A  . Number of children: 2  . Years of education: college   Occupational History  . Disabled     Retired Print production planner  .  Retired   Social History Main Topics  . Smoking status: Passive Smoke Exposure - Never Smoker  . Smokeless tobacco: Never Used     Comment: Parents  . Alcohol use 0.0 oz/week     Comment: 02/20/2012 "couple glasses of wine/6 months"  . Drug use: No  . Sexual activity: No   Other Topics Concern  . None   Social History Narrative   Patient lives at home alone. Patient  divorced.    Patient has her BS degree.   Right handed.   Caffeine- sometimes coffee.      [facility]:   Born in [place], [place]. She worked as a Copywriter, advertising. She has no pets currently. She does have indoor plants. Previously had mold in her home that was remediated. Carpet was removed.              Temp:  [  98.6 F (37 C)] 98.6 F (37 C) (07/05 0745) Pulse Rate:  [82] 82 (07/05 0745) Resp:  [14-22] 14 (07/05 0805) BP: (137-209)/(82-91) 182/82 (07/05 0805) SpO2:  [97 %-98 %] 98 % (07/05 0805) Weight:  [127 lb (57.6 kg)] 127 lb (57.6 kg) (07/05 0745)  General:  Awake. Alert. No acute distress.  Integument:  Warm & dry. No  rash or bruising on exposed skin.  Extremities:  No cyanosis or clubbing.  Lymphatics:  No appreciated cervical or supraclavicular lymphadenoapthy. HEENT:  Moist mucus membranes. No oral ulcers. No scleral injection or icterus. Mallampati Class 2.  Cardiovascular:  Regular rate & rhythm. No edema. No JVD appreciated.  Pulmonary:  Good aeration & clear to auscultation bilaterally. Symmetric chest wall expansion. No accessory muscle use. Abdomen: Soft. Normal bowel sounds. Nondistended. Nontender. Musculoskeletal:  Normal bulk and tone. Hand grip strength 5/5 bilaterally. No joint deformity or effusion appreciated. Neurological:  Cranial nerves 2-12 grossly in tact. No meningismus. Moving all 4 extremities equally. Symmetric brachioradialis deep tendon reflexes. Psychiatric:  Mood and affect congruent. Speech normal rhythm, rate & tone.   CBC Latest Ref Rng & Units 08/20/2016 04/15/2016 12/07/2015  WBC 3.8 - 10.8 K/uL 7.9 11.3(H) 6.5  Hemoglobin 12.0 - 15.0 g/dL 12.7 14.0 11.9(L)  Hematocrit 35.0 - 45.0 % 38.1 40.8 36.0  Platelets 140 - 400 K/uL 260 274 228    BMP Latest Ref Rng & Units 08/20/2016 06/14/2016 04/16/2016  Glucose 70 - 99 mg/dL 77 85 168(H)  BUN 7 - 25 mg/dL 19 18 17   Creatinine 0.50 - 0.99 mg/dL 1.42(H) 1.08(H) 1.02(H)  Sodium 135 - 146 mmol/L 143 143 140  Potassium 3.5 - 5.3 mmol/L 3.9 4.2 3.7  Chloride 98 - 110 mmol/L 108 108 113(H)  CO2 20 - 31 mmol/L 23 23 19(L)  Calcium 8.6 - 10.4 mg/dL 9.6 9.4 7.4(L)   PFT 08/08/15: FVC 2.08 L (75%) FEV1 1.73 L (82%) FEV1/FVC 0.83 FEF 25-75 2.41 L (129%) no bronchodilator response                                                                 DLCO corrected 66% (hemoglobin 10.5) 04/29/15: FVC 2.24 L (81%) FEV1 1.95 L (92%) FEV1/FVC 0.87 FEF 25-75 2.59 L (137%) no bronchodilator response TLC 3.91 L (84%) RV 83% ERV 61% DLCO corrected 62% (hemoglobin 13.7) 04/10/12:FVC 1.90 L (69%) FEV1 1.49 L (75%) FEV1/FVC 0.78 FEF 25-75 1.39 L (59%)                                                 TLC 3.55 L (80%) RV 62% ERV 52% DLCO uncorrected 72%  6MWT 08/08/15:  Walked 267 meters / Baseline Sat 99% on RA / Nadir Sat 97% on RA  IMAGING CTA CHEST 04/15/16 (previously reviewed by me):  No pulmonary embolism. Dependent subpleural atelectasis in the lower lobes. Patient does have patchy nodular opacification within right lower lobe. No pleural effusion appreciated. No pathologic mediastinal adenopathy. No pericardial effusion.  CT CHEST W/O 11/03/15 (previously reviewed by me): No pleural effusion or thickening. No pathologic mediastinal adenopathy. No pericardial effusion. Tree-in-bud opacification  within right upper lobe as well as patchy opacification linearly in right middle lobe and inferior lingula.  CT MAXILLOFACIAL W/O 08/12/15 (per radiologist): Paranasal sinuses clear without mucosal thickening or air-fluid level.  CT MAXILLOFACIAL 05/21/13 (per radiologist): Negative for chronic sinus disease.  CT CHEST W/O 06/05/12 (per radiologist): Resolution of previous right apical ground glass. Persistent tree-in-bud nodularity within basal portion RUL.  MICROBIOLOGY Sputum Ctx (11/22/15):  Normal Oral Flora / AFB negative / Fungus negative  Sputum Ctx (09/06/15):  Abundant Squamous Epithelials / Oral Flora / Mycobacterium gordonae / Fungus negative   LABS 12/07/15 CBC:  6.5/11.9/36.0/228 Eos:  455  11/09/14 BMP: 141/4.4/104/27/27/0.98/82/9.3 LFT: 4.1/6.5/0.08/17/28/31 CBC: 6.7/12.4/36.9/233  05/31/14 URINE TOX SCREEN: NEGATIVE  12/16/12 IgE: 206.6  04/01/12 RAST PANEL: RAGWEED 0.14 (class 0/1) IgE: 409.9 ASPERGILLUS ANTIGEN: 1.92  01/02/12 RAST PANEL: Weakly Pan-Positive Up to Class 1 IgE: 825.8  06/28/11 IgG: 623 IgA: 134  ASSESSMENT/PLAN:  66 y.o. female with severe, persistent asthma and lung nodules. 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 Proceeding with bronchoscopy with lavage and  transbronchial biopsies. Patient with the risks of the procedure and consented to the procedure.  Sonia Baller Ashok Cordia, M.D. [facility] Pager:  [phone] After 3pm or if no response, call [phone] 8:08 AM 08/23/16

## 2016-08-23 NOTE — Progress Notes (Signed)
Video bronchoscopy performed.  Intervention bronchial washing.   No complications noted.  Will continue to monitor. 

## 2016-08-23 NOTE — Discharge Instructions (Signed)
Flexible Bronchoscopy, Care After These instructions give you information on caring for yourself after your procedure. Your doctor may also give you more specific instructions. Call your doctor if you have any problems or questions after your procedure. Follow these instructions at home:  Do not eat or drink anything for 2 hours after your procedure. If you try to eat or drink before the medicine wears off, food or drink could go into your lungs. You could also burn yourself.  After 2 hours have passed and when you can cough and gag normally, you may eat soft food and drink liquids slowly.  The day after the test, you may eat your normal diet.  You may do your normal activities.  Keep all doctor visits. Get help right away if:  You get more and more short of breath.  You get light-headed.  You feel like you are going to pass out (faint).  You have chest pain.  You have new problems that worry you.  You cough up more than a little blood.  You cough up more blood than before. This information is not intended to replace advice given to you by your health care provider. Make sure you discuss any questions you have with your health care provider. Document Released: 12/03/2008 Document Revised: 07/14/2015 Document Reviewed: 10/10/2012 Elsevier Interactive Patient Education  2017 [place] not eat or drink anything until 10:30am on 08/23/2016.

## 2016-08-23 NOTE — Op Note (Signed)
Video Bronchoscopy Procedure Note  Pre-Procedure Diagnoses: 1.  Severe, persistent asthma 2.  Lung nodules on chest CT/Tree-in-bud opacities  Post-Procedure Diagnoses: 1.  Severe, persistent asthma 2.  Lung nodules on chest CT/Tree-in-bud opacities  Procedures Performed: 1. Bronchoscopy with Airway Inspection 2. Bronchoalveolar Lavage  Consent:  Informed consent was obtained from the patient after discussing the risks and benefits of the procedure including bleeding, infection, pneumothorax, medication allergy, vocal chord injury, and potentially death.  Conscious Sedation:   Time of First Medication Administration:  8:12 am Time of Last Medication Administration:  8:27 am Time Physicial Left Room:  8:45 am  Medications Administered During Conscious Sedation: 1. Lidocaine 1% gargle 10cc 2. Lidocaine 1% 12cc via bronchoscope 3. Versed 5 mg IV  Description of Procedure: Patient was brought back to the endoscopy procedure room.  A time out was performed to identify the correct patient and procedure.  Lidocaine gargle was performed.  Patient was laid recumbent and conscious sedation was administered by respiratory therapist under my direction.  Bite block was inserted and towel was placed over the patient's eyes.  Flexible bronchoscope was then inserted into the posterior pharynx until vocal chords were in full view. There was no abnormality of the vocal chords or arytenoids.  A total of 6cc of Lidocaine was used to anesthetize the vocal chords.  The bronchoscope was then inserted between the vocal chords with ease into the proximal trachea.  Lidocaine was then used to anesthetize the patient's proximal airways.  An airway inspeciton was performed finding a moderate amount of thin, clear secretions. Patient's mucosa appeared largely normal with some small areas of petechial hemorrhage. There was some dilatation to the airways as well bilaterally.  The bronchoscope was then advanced into the  lateral segment of the right middle lobe.  A lavage was performed by instilling a total of 180 cc of sterile saline and aspirating a total of 120 cc of clear fluid.  The flexible bronchoscope was then repositioned to the patient's inferior segment of the lingula. A lavage was performed by instilling a total of 100 mL of sterile saline and aspirating a total of 88 mL of cloudy, pink-tinged fluid. Transbronchial biopsies were not performed due to the fluctuation in the patient's blood pressure and questionable accuracy of measurement from her leg due to the restriction on her upper extremities.  The flexible bronchoscope was then removed from the patient's airways after suctioning of the remaining secretions.  The bite block was removed and the patient was returned to the upright position.  Blood Loss:  None.  Complications:  None.  Bronchoalveolar Lavage: 1. Performed on the Right Middle Lobe:  Sent for cell count, cytology, Aspergillus Antigen, Fungal smear & culture, AFB smear & culture, and quantitative culture.  2. Performed on the Lingula:  Sent for cell count, cytology, Aspergillus Antigen, Fungal smear & culture, AFB smear & culture, and quantitative culture.  Post Procedure Instructions: Personally spoke with the patient's friend relaying the preliminary results of the procedure.  Instructed her that the patient is not to operate a car for 24 hours.  The patient should seek immediate medical attention if there is any persistent or progressive hemoptysis, difficulty breathing, or chest pain/discomfort.  The patient should also notify me immediately or seek medical attention for any purulent sputum production or fever occuring today or in the coming days.  The patient will be contacted by me once the final results of the studies are available.  The patient should call my  office if they have any questions.

## 2016-08-24 LAB — ACID FAST SMEAR (AFB, MYCOBACTERIA)
Acid Fast Smear: NEGATIVE
Acid Fast Smear: NEGATIVE

## 2016-08-25 LAB — CULTURE, BAL-QUANTITATIVE W GRAM STAIN: Culture: 6000 — AB

## 2016-08-25 LAB — CULTURE, BAL-QUANTITATIVE: Culture: 1000 — AB

## 2016-08-26 LAB — ASPERGILLUS ANTIGEN, BAL/SERUM: Aspergillus Ag, BAL/Serum: 0.09 Index (ref 0.00–0.49)

## 2016-08-27 ENCOUNTER — Encounter (HOSPITAL_COMMUNITY): Payer: Self-pay | Admitting: Pulmonary Disease

## 2016-08-28 ENCOUNTER — Telehealth: Payer: Self-pay | Admitting: Family Medicine

## 2016-08-28 DIAGNOSIS — S92902D Unspecified fracture of left foot, subsequent encounter for fracture with routine healing: Secondary | ICD-10-CM

## 2016-08-28 MED ORDER — DICLOFENAC SODIUM 75 MG PO TBEC: 75.0000 mg | DELAYED_RELEASE_TABLET | Freq: Two times a day (BID) | ORAL | 0 refills | Status: DC

## 2016-08-28 NOTE — Telephone Encounter (Signed)
For short period of time she can take diclofenac 75 bid and get repeat xray of foot

## 2016-08-28 NOTE — Telephone Encounter (Signed)
Pt called [facility] stating that she fell Sunday on her tile floor and now she is having back pain along with her broken foot pain and would like to know what she can take for the pain as Tylenol and the pain patches (lidocaine patch) are no help. (She can not take NSAIDs).

## 2016-08-28 NOTE — Telephone Encounter (Signed)
Patient aware of providers recommendations. Med sent to pharm and x-ray order entered.

## 2016-08-29 ENCOUNTER — Telehealth: Payer: Self-pay | Admitting: Pulmonary Disease

## 2016-08-29 DIAGNOSIS — R059 Cough, unspecified: Secondary | ICD-10-CM

## 2016-08-29 DIAGNOSIS — R05 Cough: Secondary | ICD-10-CM

## 2016-08-29 NOTE — Telephone Encounter (Signed)
Spoke with patient. She is aware of JN's recs. She is still concerned about the cough. Now she is saying the coughing has changed since the procedure. She's coughing more and it is productive with clear phlegm. She seems to think she has picked up a "super bug" from the hospital.    JN, please advise.

## 2016-08-29 NOTE — Telephone Encounter (Signed)
Pt had Bronch 08-23-16. Pt states she developed diarrhea Friday night and   a prod cough with clear mucus the day after procedure. Pt states she has felt a lot warmer with chills since procedure, but has not measured tempeture. Pt states her friend that brought her to her procedure also had diarrhea on Friday. Pt states diarrhea is improving, as she had a soft stool yesterday.   JN please advise. Thanks.

## 2016-08-29 NOTE — Telephone Encounter (Signed)
Spoke with patient-she is aware [facility] and will come by tomorrow to have tests done. Nothing more needed at this time.

## 2016-08-29 NOTE — Telephone Encounter (Signed)
Given what is described it's likely her friend shared a viral diarrheal illness with her. Nothing new to recommend at this time as long as her sputum quality and quantity isn't changing. Thanks.

## 2016-08-29 NOTE — Telephone Encounter (Signed)
Let's get a CXR PA/LAT and do a sputum culture for AFB, Fungus, & Bacteria please. Diagnosis Cough. Thanks.

## 2016-08-30 ENCOUNTER — Other Ambulatory Visit: Payer: Medicare Other

## 2016-08-30 ENCOUNTER — Ambulatory Visit (INDEPENDENT_AMBULATORY_CARE_PROVIDER_SITE_OTHER)
Admission: RE | Admit: 2016-08-30 | Discharge: 2016-08-30 | Disposition: A | Discharge status: Home / Self Care | Payer: Medicare Other | Source: Ambulatory Visit | Attending: Pulmonary Disease | Admitting: Pulmonary Disease

## 2016-08-30 ENCOUNTER — Telehealth: Payer: Self-pay | Admitting: Pulmonary Disease

## 2016-08-30 DIAGNOSIS — R059 Cough, unspecified: Secondary | ICD-10-CM

## 2016-08-30 DIAGNOSIS — R05 Cough: Secondary | ICD-10-CM

## 2016-08-30 NOTE — Telephone Encounter (Signed)
Ebony Scott, pt called and stated you two were in communication because their was a misprinted code for her xolair injections and she wanted to know if everything was taken care of

## 2016-08-30 NOTE — Telephone Encounter (Signed)
Thus far nothing has grown. J.

## 2016-08-30 NOTE — Telephone Encounter (Signed)
JN  Please Advise-  Pt called in wanting results of her bronch

## 2016-08-30 NOTE — Progress Notes (Signed)
Spoke with patient and informed her of results. Pt verbalized understanding. Nothing further is needed.

## 2016-08-31 NOTE — Telephone Encounter (Signed)
Called and spoke with pt and she is aware of results of bronch has not grown anything as of yet.  We will keep her posted on this  Pt also wanted to know if Kathlee Nations was able to check the charges of her xolair.  Will forward to Kathlee Nations to follow up on this and contact the pt.  Thanks

## 2016-09-02 ENCOUNTER — Encounter: Payer: Self-pay | Admitting: Pulmonary Disease

## 2016-09-02 ENCOUNTER — Encounter: Payer: Self-pay | Admitting: Family Medicine

## 2016-09-02 LAB — RESPIRATORY CULTURE OR RESPIRATORY AND SPUTUM CULTURE: Organism ID, Bacteria: NORMAL

## 2016-09-03 NOTE — Telephone Encounter (Signed)
I would not expect mouth sores as a result of her bronchoscopy this far out from her procedure. Thus far, nothing has grown except normal oral flora from her 7/12 sputum culture. Without an obvious pneumonia on X-ray imaging or truly purulent phlegm I do not think that the fever is coming from an infection from her procedure either. It may be that the same process causing her mouth ulcers is also subsequently causing this fever. As far as her hand swelling goes, is there any redness or erythema? It would probably be best to have her seen either in our office or with her PCP if another MD or NP cannot see her this week. Thanks.

## 2016-09-03 NOTE — Telephone Encounter (Signed)
JN please advise. Thanks. 

## 2016-09-04 ENCOUNTER — Other Ambulatory Visit: Payer: Medicare Other

## 2016-09-04 ENCOUNTER — Encounter: Payer: Self-pay | Admitting: Family Medicine

## 2016-09-04 ENCOUNTER — Ambulatory Visit
Admission: RE | Admit: 2016-09-04 | Discharge: 2016-09-04 | Disposition: A | Discharge status: Home / Self Care | Payer: Medicare Other | Source: Ambulatory Visit | Attending: Family Medicine | Admitting: Family Medicine

## 2016-09-04 ENCOUNTER — Ambulatory Visit (INDEPENDENT_AMBULATORY_CARE_PROVIDER_SITE_OTHER): Payer: Medicare Other | Admitting: Family Medicine

## 2016-09-04 VITALS — BP 140/80 | HR 78 | Temp 98.1°F | Resp 14 | Ht 62.0 in | Wt 129.0 lb

## 2016-09-04 DIAGNOSIS — M7989 Other specified soft tissue disorders: Secondary | ICD-10-CM

## 2016-09-04 DIAGNOSIS — H6012 Cellulitis of left external ear: Secondary | ICD-10-CM | POA: Diagnosis not present

## 2016-09-04 DIAGNOSIS — S92902D Unspecified fracture of left foot, subsequent encounter for fracture with routine healing: Secondary | ICD-10-CM

## 2016-09-04 LAB — COMPLETE METABOLIC PANEL WITH GFR
ALT: 33 U/L — ABNORMAL HIGH (ref 6–29)
AST: 37 U/L — ABNORMAL HIGH (ref 10–35)
Albumin: 3.7 g/dL (ref 3.6–5.1)
Alkaline Phosphatase: 47 U/L (ref 33–130)
BUN: 19 mg/dL (ref 7–25)
CO2: 22 mmol/L (ref 20–31)
Calcium: 9.3 mg/dL (ref 8.6–10.4)
Chloride: 108 mmol/L (ref 98–110)
Creat: 1.2 mg/dL — ABNORMAL HIGH (ref 0.50–0.99)
GFR, Est African American: 54 mL/min — ABNORMAL LOW (ref >=60)
GFR, Est Non African American: 47 mL/min — ABNORMAL LOW (ref >=60)
Glucose, Bld: 97 mg/dL (ref 70–99)
Potassium: 4.1 mmol/L (ref 3.5–5.3)
Sodium: 142 mmol/L (ref 135–146)
Total Bilirubin: 0.3 mg/dL (ref 0.2–1.2)
Total Protein: 6.1 g/dL (ref 6.1–8.1)

## 2016-09-04 MED ORDER — SULFAMETHOXAZOLE-TRIMETHOPRIM 800-160 MG PO TABS: 1.0000 | ORAL_TABLET | Freq: Two times a day (BID) | ORAL | 0 refills | Status: DC

## 2016-09-04 NOTE — Telephone Encounter (Signed)
Kathlee Nations please advise if you have spoken with this pt.  Thanks

## 2016-09-04 NOTE — Progress Notes (Signed)
Subjective:    Patient ID: Ebony Scott, female    DOB: 1950-12-10, 66 y.o.   MRN: [id number]  HPI Patient called yesterday stating that her left lower earlobe is red hot swollen and painful. She is here today to recheck this. She also reported significant swelling in both legs right greater than left. She was concerned that there may be kidney damage due to the diclofenac she's been taking for pain in her left foot. On examination today, there is no edema in either foot. There is no redness. There is no swelling. There is trace pretibial edema in the left leg above the Ace wrap that she has been wearing around her foot. But there is no pitting edema in the foot or ankles. Patient states that it's good in the morning and then accumulates by the end of the day. She has been drinking more fluid recently because she's been taking NSAIDs and she's been trying to "flush her kidneys"  Past Medical History:  Diagnosis Date  . Allergic rhinitis   . Allergy    SEASONAL  . Anemia   . Anxiety    pt denies  . Arthritis    "mostly the hands" (02/20/2012)  . Asthma   . Breast cancer ([place]) 1998   in remission  . Cataract    REMOVED  . Chronic lower back pain   . Chronic neck pain   . Complication of anesthesia    "had hard time waking up from it several times" (02/20/2012)  . Depression    "some; don't take anything for it" (02/20/2012)  . Diverticulosis   . DVT (deep venous thrombosis) ([place])   . Exertional dyspnea   . Fibromyalgia 11/2011  . GERD (gastroesophageal reflux disease)   . Graves disease   . Headache(784.0)    "related to allergies; more at different times during the year" (02/20/2012)  . Hemorrhoids   . Hiatal hernia    back and neck  . Hx of adenomatous colonic polyps 04/12/2016  . Hypercholesteremia    good cholesterol is high  . Hypothyroidism   . IBS (irritable bowel syndrome)   . Moderate persistent asthma    -FeV1 72% 2011, -IgE 102 2011, CT sinus Neg 2011  . Osteoporosis      on reclast yearly  . Pneumonia 04/2011; ~ 11/2011   "double; single" (02/20/2012)   Past Surgical History:  Procedure Laterality Date  . ANTERIOR AND POSTERIOR REPAIR  1990's  . APPENDECTOMY    . BREAST LUMPECTOMY  1998   left  . CARPOMETACARPEL ([place]) FUSION OF THUMB WITH AUTOGRAFT FROM RADIUS  ~ 2009   "both thumbs" (02/20/2012)  . CATARACT EXTRACTION W/ INTRAOCULAR LENS  IMPLANT, BILATERAL  2012  . CERVICAL DISCECTOMY  10/2001   C5-C6  . CERVICAL FUSION  2003   C3-C4  . CHOLECYSTECTOMY    . COLONOSCOPY    . DEBRIDEMENT TENNIS ELBOW  ?1970's   right  . ESOPHAGOGASTRODUODENOSCOPY    . HYSTERECTOMY    . KNEE ARTHROPLASTY  ?1990's   "?right; w/cartilage repair" (02/20/2012)  . NASAL SEPTUM SURGERY  1980's  . POSTERIOR CERVICAL FUSION/FORAMINOTOMY  2004   "failed initial fusion; rewired  anterior neck" (02/20/2012)  . TONSILLECTOMY  ~ 1953  . VESICOVAGINAL FISTULA CLOSURE W/ TAH  1988  . VIDEO BRONCHOSCOPY Bilateral 08/23/2016   Procedure: VIDEO BRONCHOSCOPY WITH FLUORO;  Surgeon: Javier Glazier, MD;  Location: [facility];  Service: Cardiopulmonary;  Laterality: Bilateral;   Current Outpatient  Prescriptions on File Prior to Visit  Medication Sig Dispense Refill  . acetaminophen (TYLENOL) 650 MG CR tablet Take 1,300 mg by mouth 2 (two) times daily.    Marland Kitchen albuterol (PROAIR HFA) 108 (90 Base) MCG/ACT inhaler Inhale 2 puffs into the lungs every 4 (four) hours as needed for wheezing or shortness of breath.    . Alpha-D-Galactosidase (BEANO PO) Take 1 capsule by mouth 3 (three) times daily with meals.    . Alpha-Lipoic Acid 600 MG CAPS Take 600 mg by mouth daily.     Marland Kitchen alum & mag hydroxide-simeth (GELUSIL) 200-200-25 MG chewable tablet Chew 1 tablet by mouth every 6 (six) hours as needed for flatulence.    Marland Kitchen azelastine (ASTELIN) 0.1 % nasal spray USE 2 SPRAYS IN EACH NOSTRIL DAILY AS DIRECTED (Patient taking differently: USE 1 SPRAYS IN EACH NOSTRIL AT BEDTIME) 30 mL 5  . cetirizine  (ZYRTEC) 10 MG tablet Take 5 mg by mouth every evening.     . Cholecalciferol (VITAMIN D3) 10000 units TABS Take 1,000 Units by mouth daily.    . colchicine 0.6 MG tablet TAKE 1 TABLET BY MOUTH DAILY (Patient taking differently: TAKE 1 TABLET BY MOUTH EVERY EVENING) 90 tablet 1  . cyproheptadine (PERIACTIN) 4 MG tablet Take two tablets every evening (Patient taking differently: Take 8 mg by mouth every evening. ) 60 tablet 5  . denosumab (PROLIA) 60 MG/ML SOLN injection Inject 60 mg into the skin every 6 (six) months. Administer in upper arm, thigh, or abdomen    . dexlansoprazole (DEXILANT) 60 MG capsule Take 1 capsule (60 mg total) by mouth daily. 30 capsule 5  . diclofenac (VOLTAREN) 75 MG EC tablet Take 1 tablet (75 mg total) by mouth 2 (two) times daily. 30 tablet 0  . gabapentin (NEURONTIN) 600 MG tablet Take 1 tablet (600 mg total) by mouth daily. 90 tablet 3  . guaifenesin (HUMIBID E) 400 MG TABS tablet Take 400 mg by mouth 2 (two) times daily.    . Lactase (LACTAID FAST ACT) 9000 units TABS Take 18,000 Units by mouth 4 (four) times daily as needed (DAIRY).    Marland Kitchen lidocaine (LIDODERM) 5 % Place 1 patch onto the skin daily. Remove & Discard patch within 12 hours or as directed by MD 90 patch 3  . lipase/protease/amylase (CREON) 12000 units CPEP capsule Take 1 capsule (12,000 Units total) by mouth 3 (three) times daily. CREON 24000-76000-120000 U Takes with meals 270 capsule 5  . meclizine (ANTIVERT) 25 MG tablet Take 25 mg by mouth every 6 (six) hours as needed for dizziness.    . mirtazapine (REMERON SOLTAB) 30 MG disintegrating tablet Take 1 tablet (30 mg total) by mouth at bedtime. 30 tablet 5  . mometasone (NASONEX) 50 MCG/ACT nasal spray Place 2 sprays into the nose 2 (two) times daily. 17 g 0  . mometasone-formoterol (DULERA) 200-5 MCG/ACT AERO Inhale 2 puffs into the lungs 2 (two) times daily. 1 Inhaler 5  . montelukast (SINGULAIR) 10 MG tablet TAKE 1 TABLET BY MOUTH ONCE DAILY 30 tablet  11  . Multiple Minerals-Vitamins (CALCIUM CITRATE PLUS/MAGNESIUM PO) Take 1 tablet by mouth 2 (two) times daily.    . mupirocin ointment (BACTROBAN) 2 % Place 1 application into the nose 3 (three) times daily as needed (DRY NOSE).     Marland Kitchen neomycin-bacitracin-polymyxin (NEOSPORIN) ointment Apply 1 application topically 3 (three) times daily as needed for wound care. apply to eye    . nystatin (MYCOSTATIN) 100000 UNIT/ML suspension USE  AS DIRECTED 5MLS IN MOUTH OR THROAT 4 TIMES DAILY (Patient taking differently: USE AS DIRECTED 5MLS IN MOUTH OR THROAT EVERY EVENING) 473 mL 2  . nystatin-triamcinolone ointment (MYCOLOG) Apply 1 application topically 3 (three) times daily as needed (YEAST INFECTION).     Marland Kitchen Pancrelipase, Lip-Prot-Amyl, 24000-76000 units CPEP Take 1 capsule by mouth See admin instructions. Takes 1 capsule 3 times daily with meals and occasionally takes 1 capsule with large snacks  24000-76000-120000    . pravastatin (PRAVACHOL) 20 MG tablet TAKE 1 TABLET BY MOUTH DAILY 90 tablet 3  . Prenatal Vit-Sel-Fe Fum-FA (VINATE M) 27-1 MG TABS Take 1 tablet by mouth daily. 30 each 11  . PRESCRIPTION MEDICATION Place 2 puffs into both ears daily as needed. Pt has compounded capsule (powder) that she uses In her ears as needed; CS (X) AHCL    CHLORAMP/SULFU/AMPHO B/ H DROC 100-100-5 mg capsule    . Probiotic Product (ALIGN) 4 MG CAPS Take 1 tablet by mouth daily.     Marland Kitchen PROCTO-MED HC 2.5 % rectal cream Place 1 application rectally 4 (four) times daily as needed for hemorrhoids or itching.     Marland Kitchen Propylene Glycol-Glycerin (SOOTHE OP) Place 1 drop into both eyes 2 (two) times daily.     Marland Kitchen propylthiouracil (PTU) 50 MG tablet TAKE 1/2 TABLET BY MOUTH TWICE DAILY 30 tablet 3  . ranitidine (ZANTAC) 300 MG tablet Take 1 tablet (300 mg total) by mouth daily. (Patient taking differently: Take 300 mg by mouth every evening. ) 30 tablet 5  . Respiratory Therapy Supplies (FLUTTER) DEVI Use as directed 1 each 0  .  SIMPLY SALINE NA Place 1 spray into both nostrils at bedtime.    Marland Kitchen Spacer/Aero-Holding Chambers (AEROCHAMBER MV) inhaler Use as instructed 1 each 0  . Tiotropium Bromide Monohydrate (SPIRIVA RESPIMAT) 2.5 MCG/ACT AERS Inhale 2 puffs into the lungs daily.    . diclofenac sodium (VOLTAREN) 1 % GEL Apply 2 g topically 4 (four) times daily. (Patient not taking: Reported on 09/04/2016) 1 Tube 0  . EPINEPHrine (EPIPEN 2-PAK IJ) Inject as directed.    . [DISCONTINUED] methimazole (TAPAZOLE) 5 MG tablet Take 5 mg by mouth 3 (three) times daily.       No current facility-administered medications on file prior to visit.    Allergies  Allergen Reactions  . Dust Mite Extract Shortness Of Breath and Other (See Comments)    "sneezing" (02/20/2012)  . Molds & Smuts Shortness Of Breath  . Morphine Sulfate Itching  . Other Shortness Of Breath and Other (See Comments)    Grass and weeds "sneezing; filled sinuses" (02/20/2012)  . Penicillins Rash and Other (See Comments)    "welts" (02/20/2012) Has patient had a PCN reaction causing immediate rash, facial/tongue/throat swelling, SOB or lightheadedness with hypotension: Unknown Has patient had a PCN reaction causing severe rash involving mucus membranes or skin necrosis: No Has patient had a PCN reaction that required hospitalization: No Has patient had a PCN reaction occurring within the last 10 years: No If all of the above answers are "NO", then may proceed with Cephalosporin use.   Marland Kitchen Reclast [Zoledronic Acid] Other (See Comments)    Fever, Put in hospital, dr said it was a reaction from a reaction   . Rofecoxib Swelling    Celebrex/vioxx REACTION: feet swelling  . Shrimp Flavor Anaphylaxis    ALL SHELLFISH  . Tetracycline Hcl Nausea And Vomiting  . Xolair [Omalizumab] Other (See Comments)    Caused Blood  clot  . Dilaudid [Hydromorphone Hcl] Itching  . Hydrocodone-Acetaminophen Nausea And Vomiting  . Levofloxacin Other (See Comments)    REACTION: GI  upset  . Oxycodone Hcl Nausea And Vomiting  . Paroxetine Nausea And Vomiting    Paxil   . Diltiazem Swelling  . Tree Extract Other (See Comments)    "tested and told I was allergic to it; never experienced a reaction to it" (02/20/2012)   Social History   Social History  . Marital status: Divorced    Spouse name: N/A  . Number of children: 2  . Years of education: college   Occupational History  . Disabled     Retired Print production planner  .  Retired   Social History Main Topics  . Smoking status: Passive Smoke Exposure - Never Smoker  . Smokeless tobacco: Never Used     Comment: Parents  . Alcohol use 0.0 oz/week     Comment: 02/20/2012 "couple glasses of wine/6 months"  . Drug use: No  . Sexual activity: No   Other Topics Concern  . Not on file   Social History Narrative   Patient lives at home alone. Patient  divorced.    Patient has her BS degree.   Right handed.   Caffeine- sometimes coffee.      [facility]:   Born in [place], [place]. She worked as a Copywriter, advertising. She has no pets currently. She does have indoor plants. Previously had mold in her home that was remediated. Carpet was removed.              Review of Systems  All other systems reviewed and are negative.      Objective:   Physical Exam  Constitutional: She appears well-developed and well-nourished.  HENT:  Head:    Cardiovascular: Normal rate, regular rhythm and normal heart sounds.   Pulmonary/Chest: Effort normal and breath sounds normal.  Musculoskeletal: She exhibits no edema.  Skin: No rash noted. There is erythema.  Vitals reviewed.   There is redness and swelling in the area outlined on the diagram      Assessment & Plan:  Leg swelling - Plan: COMPLETE METABOLIC PANEL WITH GFR  Cellulitis of left ear - Plan: sulfamethoxazole-trimethoprim (BACTRIM DS,SEPTRA DS) 800-160 MG tablet  I believe the patient has cellulitis in her left lower earlobe. Begin  Bactrim double strength tablets 1 by mouth twice a day for 7 days. I do not appreciate any significant edema in the legs. Patient's primary concern was regarding kidney failure or liver damage. We can certainly repeat a CMP to evaluate this however I believe the swelling is likely due to her increased fluid intake and venous insufficiency and dependent edema. Tried to reassure the patient that I see no significant edema on exam

## 2016-09-05 ENCOUNTER — Encounter: Payer: Self-pay | Admitting: Family Medicine

## 2016-09-05 DIAGNOSIS — S92902G Unspecified fracture of left foot, subsequent encounter for fracture with delayed healing: Secondary | ICD-10-CM

## 2016-09-06 ENCOUNTER — Encounter: Payer: Self-pay | Admitting: Family Medicine

## 2016-09-06 DIAGNOSIS — S92352A Displaced fracture of fifth metatarsal bone, left foot, initial encounter for closed fracture: Secondary | ICD-10-CM

## 2016-09-09 ENCOUNTER — Encounter: Payer: Self-pay | Admitting: Pulmonary Disease

## 2016-09-10 ENCOUNTER — Other Ambulatory Visit: Payer: Self-pay | Admitting: Family Medicine

## 2016-09-10 ENCOUNTER — Other Ambulatory Visit: Payer: Self-pay | Admitting: Allergy and Immunology

## 2016-09-10 MED ORDER — RANITIDINE HCL 300 MG PO TABS: 300.0000 mg | ORAL_TABLET | Freq: Every day | ORAL | 5 refills | Status: DC

## 2016-09-10 NOTE — Telephone Encounter (Signed)
1.  The reason your sputum culture is showing "normal orpharyngeal flora" is because the sample is primarily saliva. Even though you feel like it is coming from your lungs it is not because the Squamous Epithelial Cells do not exist in your lungs, only your mouth.  2.  I agree that attempting further sputum cultures would be of extremely low yield without an obvious pneumonia on chest x-ray in the future  3.  Thus far your cultures from your bronchoscopy have not grown any abnormal organisms. It will be a couple more weeks or so before they finalize.  4.  An abnormal colored fluid while by definition is abnormal but not to be concerned about unless you grow an organism from it. There can be different reasons for this.  5.  It could be the "sores" in your mouth were due to a reaction to the Lidocaine we had you gargle. Or it could have been due to superficial abrasions from the bronchoscope itself. As long as they're getting better, that's the main thing.   JN

## 2016-09-10 NOTE — Telephone Encounter (Signed)
Kathlee Nations please advise if you have spoken with the pt.  Thanks

## 2016-09-10 NOTE — Telephone Encounter (Signed)
JN  Please Advise-please see pt email

## 2016-09-11 ENCOUNTER — Other Ambulatory Visit: Payer: Self-pay | Admitting: *Deleted

## 2016-09-11 MED ORDER — COLCHICINE 0.6 MG PO TABS: 0.6000 mg | ORAL_TABLET | Freq: Every day | ORAL | 3 refills | Status: DC | PRN

## 2016-09-11 NOTE — Telephone Encounter (Signed)
Spoke to patient several times.  I am still working on her issue.

## 2016-09-11 NOTE — Telephone Encounter (Signed)
Received fax requesting refill on [place].   Prescription sent to pharmacy.

## 2016-09-12 ENCOUNTER — Telehealth: Payer: Self-pay | Admitting: Family Medicine

## 2016-09-12 NOTE — Telephone Encounter (Signed)
PA submitted through [email or web address] and received the following:   [facility] is reviewing your PA request. Typically an electronic response will be received within 72 hours. To check for an update later, open this request from your dashboard.

## 2016-09-13 MED ORDER — COLCHICINE 0.6 MG PO TABS: 0.6000 mg | ORAL_TABLET | Freq: Every day | ORAL | 5 refills | Status: DC | PRN

## 2016-09-13 NOTE — Telephone Encounter (Signed)
Received the following from insurance - N/A today  This medication was previously approved. You will be able to fill a prescription for this medication at your pharmacy. If your pharmacy has questions regarding the processing of your prescription, please have them call the [facility] pharmacy help desk at [phone].**Please note: Please contact your pharmacy to resubmit your claim using an alternative NDC which is properly listed with the FDA.   Information sent to pharm

## 2016-09-20 LAB — FUNGUS CULTURE WITH STAIN

## 2016-09-20 LAB — FUNGUS CULTURE RESULT

## 2016-09-20 LAB — FUNGAL ORGANISM REFLEX

## 2016-09-21 LAB — FUNGAL ORGANISM REFLEX

## 2016-09-21 LAB — FUNGUS CULTURE RESULT

## 2016-09-21 LAB — FUNGUS CULTURE WITH STAIN

## 2016-09-26 ENCOUNTER — Telehealth: Payer: Self-pay | Admitting: Pulmonary Disease

## 2016-09-26 NOTE — Telephone Encounter (Signed)
I'm sorry she doesn't recall this but there is clearly a phone note and my office note from 9/20 that documents my concern and I have had multiple discussions with the patient regarding these results. Her fungus cultures finalized as negative. Can you please call the lab to see where we are at with the AFB cultures from 7/5. They probably won't finalize for another 2 weeks given the 6 week culture time. Thanks.

## 2016-09-26 NOTE — Telephone Encounter (Signed)
Left message for patient to call back  

## 2016-09-26 NOTE — Telephone Encounter (Signed)
Spoke with patient. She stated that she had a sputum culture done last year on 09/06/15 and was advised that it came back negative. Since then, she has done numerous culture and was told that they are came back negative. She looked on MyChart at results from last year (09/06/15) and it mentioned a possible mycobacterium infection. She stated that no one has ever told her that and she is concerned.  She stated that she just had a bronchoscopy on July 5th of this year and she's still waiting on the results.   JN, please advise. Thanks!

## 2016-09-26 NOTE — Telephone Encounter (Signed)
Patient is returning phone call.  °

## 2016-09-27 LAB — HM DEXA SCAN

## 2016-09-27 NOTE — Telephone Encounter (Signed)
Had to call [facility] to find out when we should expect the results and they stated that it takes 6 weeks and it has only been 4 weeks. Previous message has been sent to [facility] in regards to the patient

## 2016-09-27 NOTE — Telephone Encounter (Signed)
JN  Spoke with pt who was very upset and has multiple questions and concerns and feeling like she has spent a lot of money for testing she would not actually need. She is requesting a phone call from you she is available today after 6pm and is available all day tomorrow. Tried to assist the patient and answer her questions but she would not allow me to.

## 2016-09-28 LAB — FUNGUS CULTURE W SMEAR

## 2016-10-01 NOTE — Telephone Encounter (Signed)
Attempted to call the patient today but there was no answer or voicemail. I will attempt again to contact the patient tomorrow.

## 2016-10-03 NOTE — Telephone Encounter (Signed)
Spoke with the patient at length discussing her culture results. Explained that it is likely a computer programming glitch that led to her sputum cultures from last year appearing on her "MyChart". We discussed the safety with doing transbronchial biopsies given the unstable nature of her blood pressure during her bronchoscopy and why they weren't done as well as the time necessary to allow for finalization of her culture results from her lavage. She is very upset and concerned about a bill for a Xolair injection from December of 2017 that evidently wasn't billed correctly and hasn't been covered under a grant due to the delay in appropriate documentation or rebilling and feels this was an error of the [facility] billing system which she should not be penalized for given that had it been submitted before July 20, 2016, it would have been covered. I expressed my frustration with our billing system and explained that as we are a part of the [facility] system there was no way to separate our office billing from them. She has already made Jeralyn Bennett in our office of aware of this issue. I will make our office manager aware of it as well, so that he can help assist with getting her an answer.   Please send this message over to [facility] directly and request he look into this and contact Mrs. Riera. Thank you.

## 2016-10-04 NOTE — Telephone Encounter (Signed)
Per Daneil Dan, message was discussed with Maryann Conners and placed on Keith's desk for when he returns to the office on 8.20.18

## 2016-10-05 ENCOUNTER — Encounter: Payer: Self-pay | Admitting: Family Medicine

## 2016-10-06 LAB — ACID FAST CULTURE WITH REFLEXED SENSITIVITIES (MYCOBACTERIA)
Acid Fast Culture: NEGATIVE
Acid Fast Culture: NEGATIVE

## 2016-10-07 ENCOUNTER — Encounter: Payer: Self-pay | Admitting: Family Medicine

## 2016-10-08 NOTE — Progress Notes (Signed)
Spoke with patient and made her aware of results. She verbalized understanding and did not have any questions. Nothing further is needed.

## 2016-10-09 ENCOUNTER — Encounter: Payer: Self-pay | Admitting: Pulmonary Disease

## 2016-10-09 NOTE — Telephone Encounter (Signed)
JN please advise. Thanks. 

## 2016-10-10 ENCOUNTER — Ambulatory Visit
Admission: RE | Admit: 2016-10-10 | Discharge: 2016-10-10 | Disposition: A | Discharge status: Home / Self Care | Payer: Medicare Other | Source: Ambulatory Visit | Attending: Family Medicine | Admitting: Family Medicine

## 2016-10-10 DIAGNOSIS — S92352A Displaced fracture of fifth metatarsal bone, left foot, initial encounter for closed fracture: Secondary | ICD-10-CM

## 2016-10-11 ENCOUNTER — Encounter: Payer: Self-pay | Admitting: Family Medicine

## 2016-10-11 ENCOUNTER — Other Ambulatory Visit: Payer: Self-pay

## 2016-10-11 MED ORDER — MOMETASONE FURO-FORMOTEROL FUM 200-5 MCG/ACT IN AERO: 2.0000 | INHALATION_SPRAY | Freq: Two times a day (BID) | RESPIRATORY_TRACT | 3 refills | Status: DC

## 2016-10-15 ENCOUNTER — Telehealth: Payer: Self-pay | Admitting: Allergy and Immunology

## 2016-10-15 NOTE — Telephone Encounter (Signed)
Just got done doing the pa

## 2016-10-15 NOTE — Telephone Encounter (Signed)
Pt called and said that she had to paid full price for her meds because we did not do approval for it she said she would like for [place] to work on that.

## 2016-10-15 NOTE — Telephone Encounter (Signed)
Lm for pt explaining that I just done the PA

## 2016-10-16 LAB — AFB CULTURE WITH SMEAR (NOT AT ARMC)

## 2016-10-25 NOTE — Telephone Encounter (Signed)
Spoke to patient and she stated that she will contact the insurance company to find out why they will not pay for her medication.

## 2016-10-25 NOTE — Telephone Encounter (Signed)
Prior auth for the cyproheptadine was denied. Please advise and thank you.

## 2016-10-25 NOTE — Telephone Encounter (Signed)
Please have Ebony Scott call around to independent pharmacies to check on cheaper prices for generic cyproheptadine and let her know that PA was denied by [employer].

## 2016-10-26 ENCOUNTER — Telehealth: Payer: Self-pay | Admitting: Allergy and Immunology

## 2016-10-26 NOTE — Telephone Encounter (Signed)
Patient called about a prescription for cyproheptadine. She called her insurance because it was denied. They told her she needed to file an appeal and she said she talked with a nurse last week about this and is wondering if this has been done yet.

## 2016-10-29 NOTE — Telephone Encounter (Signed)
Looks like Sherle Poe was working with this pt.

## 2016-10-30 NOTE — Telephone Encounter (Signed)
Patient has called in stating that she has some information that may be useful for her appeal. Please call patient at [phone]

## 2016-10-31 ENCOUNTER — Other Ambulatory Visit: Payer: Self-pay | Admitting: Family Medicine

## 2016-10-31 DIAGNOSIS — J302 Other seasonal allergic rhinitis: Secondary | ICD-10-CM

## 2016-10-31 DIAGNOSIS — J3089 Other allergic rhinitis: Secondary | ICD-10-CM

## 2016-11-06 ENCOUNTER — Other Ambulatory Visit: Payer: Self-pay | Admitting: Family Medicine

## 2016-11-06 ENCOUNTER — Ambulatory Visit
Admission: RE | Admit: 2016-11-06 | Discharge: 2016-11-06 | Disposition: A | Discharge status: Home / Self Care | Payer: Medicare Other | Source: Ambulatory Visit | Attending: Family Medicine | Admitting: Family Medicine

## 2016-11-06 ENCOUNTER — Ambulatory Visit (INDEPENDENT_AMBULATORY_CARE_PROVIDER_SITE_OTHER): Payer: Medicare Other

## 2016-11-06 ENCOUNTER — Encounter: Payer: Self-pay | Admitting: Family Medicine

## 2016-11-06 DIAGNOSIS — S92302D Fracture of unspecified metatarsal bone(s), left foot, subsequent encounter for fracture with routine healing: Secondary | ICD-10-CM

## 2016-11-06 DIAGNOSIS — Z23 Encounter for immunization: Secondary | ICD-10-CM | POA: Diagnosis not present

## 2016-11-06 NOTE — Progress Notes (Signed)
Patient received flu vaccine in right deltoid. Patient  tolerated well

## 2016-11-07 NOTE — Telephone Encounter (Signed)
Resubmitted the PA request online.

## 2016-11-08 ENCOUNTER — Other Ambulatory Visit: Payer: Self-pay

## 2016-11-08 ENCOUNTER — Telehealth: Payer: Self-pay | Admitting: Allergy and Immunology

## 2016-11-08 ENCOUNTER — Other Ambulatory Visit: Payer: Self-pay | Admitting: *Deleted

## 2016-11-08 MED ORDER — DEXLANSOPRAZOLE 60 MG PO CPDR: 60.0000 mg | DELAYED_RELEASE_CAPSULE | Freq: Every day | ORAL | 2 refills | Status: DC

## 2016-11-08 NOTE — Telephone Encounter (Signed)
Pt called and said pharmacy sent refills for Dexilant but has not heard anything. [facility] [phone].

## 2016-11-08 NOTE — Telephone Encounter (Signed)
Called and spoke with patient. Refills have been sent it to the pharmacy.

## 2016-11-09 ENCOUNTER — Other Ambulatory Visit: Payer: Self-pay | Admitting: Allergy and Immunology

## 2016-11-09 ENCOUNTER — Other Ambulatory Visit: Payer: Self-pay

## 2016-11-09 MED ORDER — MUPIROCIN 2 % EX OINT: 1.0000 "application " | TOPICAL_OINTMENT | Freq: Three times a day (TID) | CUTANEOUS | 1 refills | Status: DC | PRN

## 2016-11-09 NOTE — Telephone Encounter (Signed)
Patient needs a refill on mupirocin ointment called into [facility].

## 2016-11-09 NOTE — Telephone Encounter (Signed)
[place] and spoke with [place]. He advised that the patient had picked this prescription up 10-31-16 and insurance covered it. He also advised that they called the insurance company and spoke with them. It seems there was a mess up on the insurance company's end. This has been resolved, per [place], and should not require anything further on our end.

## 2016-11-09 NOTE — Telephone Encounter (Signed)
I called [facility] today because I had received a no PA required message this morning. When I checked back there was a message saying that a pa had been denied for the patient. The rep that I spoke with this afternoon said that she was showing a paid claim on today's date. She also advised that insurance was requiring the PA. She has sent me a denial packet so that I can complete and appeal. I will send that today and hopefully hear something back this afternoon.

## 2016-11-09 NOTE — Telephone Encounter (Signed)
Called patient this morning. Advised that per [facility] the medication is covered. Advised her to let pharmacy know this and have them contact insurance with any questions. I also let her know to call back with any further questions or concerns.

## 2016-11-09 NOTE — Telephone Encounter (Signed)
rx sent

## 2016-11-09 NOTE — Telephone Encounter (Signed)
Per covermymeds.[email or web address]:  N/A on September 19  This medication is on your plan's list of covered drugs. Prior authorization is not required at this time. If your pharmacy has questions regarding the processing of your prescription, please have them call the [facility] pharmacy help desk at [phone]. For additional information, the member can contact Member Services by calling the number on the back of their ID Card.   Patient needs to be notified of this. She will need to contact the pharmacy.

## 2016-11-09 NOTE — Telephone Encounter (Signed)
Received another PA request on covermymeds. I have resubmitted the PA again.

## 2016-11-19 ENCOUNTER — Encounter: Payer: Self-pay | Admitting: Family Medicine

## 2016-11-22 ENCOUNTER — Encounter: Payer: Self-pay | Admitting: Family Medicine

## 2016-11-22 ENCOUNTER — Other Ambulatory Visit: Payer: Medicare Other

## 2016-11-22 DIAGNOSIS — R945 Abnormal results of liver function studies: Secondary | ICD-10-CM

## 2016-11-22 DIAGNOSIS — R7989 Other specified abnormal findings of blood chemistry: Secondary | ICD-10-CM

## 2016-11-22 DIAGNOSIS — N289 Disorder of kidney and ureter, unspecified: Secondary | ICD-10-CM

## 2016-11-22 LAB — COMPREHENSIVE METABOLIC PANEL
AG Ratio: 1.6 (calc) (ref 1.0–2.5)
ALT: 34 U/L — ABNORMAL HIGH (ref 6–29)
AST: 29 U/L (ref 10–35)
Albumin: 3.9 g/dL (ref 3.6–5.1)
Alkaline phosphatase (APISO): 42 U/L (ref 33–130)
BUN/Creatinine Ratio: 20 (calc) (ref 6–22)
BUN: 26 mg/dL — ABNORMAL HIGH (ref 7–25)
CO2: 26 mmol/L (ref 20–32)
Calcium: 9.1 mg/dL (ref 8.6–10.4)
Chloride: 105 mmol/L (ref 98–110)
Creat: 1.31 mg/dL — ABNORMAL HIGH (ref 0.50–0.99)
Globulin: 2.5 g/dL (calc) (ref 1.9–3.7)
Glucose, Bld: 90 mg/dL (ref 65–99)
Potassium: 4.3 mmol/L (ref 3.5–5.3)
Sodium: 141 mmol/L (ref 135–146)
Total Bilirubin: 0.5 mg/dL (ref 0.2–1.2)
Total Protein: 6.4 g/dL (ref 6.1–8.1)

## 2016-11-26 ENCOUNTER — Encounter: Payer: Self-pay | Admitting: Family Medicine

## 2016-12-07 ENCOUNTER — Ambulatory Visit: Payer: Medicare Other | Admitting: Family Medicine

## 2016-12-07 ENCOUNTER — Other Ambulatory Visit: Payer: Self-pay | Admitting: Allergy and Immunology

## 2016-12-07 ENCOUNTER — Telehealth: Payer: Self-pay | Admitting: Pulmonary Disease

## 2016-12-07 ENCOUNTER — Other Ambulatory Visit: Payer: Self-pay | Admitting: Family Medicine

## 2016-12-07 MED ORDER — CYPROHEPTADINE HCL 4 MG PO TABS: ORAL_TABLET | ORAL | 0 refills | Status: DC

## 2016-12-07 NOTE — Telephone Encounter (Signed)
Spoke with Lanny Hurst and he has printed everything out for this pt.  I have called her and made her aware that these print outs have been placed in the mail to her.

## 2016-12-07 NOTE — Telephone Encounter (Addendum)
Received fax for refill on Cyproheptadine. Patient was last seen in June of 2018. Refill sent in

## 2016-12-07 NOTE — Telephone Encounter (Signed)
Will print this message out and give this information to [facility].

## 2016-12-10 ENCOUNTER — Encounter: Payer: Self-pay | Admitting: Allergy and Immunology

## 2016-12-14 ENCOUNTER — Other Ambulatory Visit: Payer: Self-pay

## 2016-12-14 MED ORDER — AZELASTINE HCL 0.1 % NA SOLN
NASAL | 5 refills | Status: DC
Start: 2016-12-14 — End: 2017-07-16

## 2016-12-14 NOTE — Telephone Encounter (Signed)
Patient called requesting refill of azelastine nasal spray.

## 2016-12-24 ENCOUNTER — Telehealth: Payer: Self-pay | Admitting: Pulmonary Disease

## 2016-12-24 NOTE — Telephone Encounter (Signed)
Per pt's chart, this has been an ongoing conversation since March of this year  Spoke with Alroy Bailiff who reported she has been waiting on patient's final decision on whether or not to begin [place].  Tammy will contact [facility] to see if pt's application/rx is still viable or if the process needs to be started again.  She will contact patient.  Called spoke with patient and discussed the above.  Pt voiced her understanding.  Will route to [employer].

## 2016-12-24 NOTE — Telephone Encounter (Signed)
Called [place] her p/a is good thru 02/18/17 apparently her rx is as well. Carley Hammed is going to call Mrs. Finnie and get her permission to ship. Then they should call us to set up a del.( 90 day supply) Date. Waiting.

## 2016-12-25 ENCOUNTER — Telehealth: Payer: Self-pay | Admitting: Pulmonary Disease

## 2016-12-26 ENCOUNTER — Telehealth: Payer: Self-pay | Admitting: Pulmonary Disease

## 2016-12-31 ENCOUNTER — Other Ambulatory Visit: Payer: Self-pay | Admitting: Internal Medicine

## 2016-12-31 ENCOUNTER — Telehealth: Payer: Self-pay | Admitting: Pulmonary Disease

## 2016-12-31 ENCOUNTER — Other Ambulatory Visit: Payer: Self-pay | Admitting: Family Medicine

## 2016-12-31 ENCOUNTER — Other Ambulatory Visit: Payer: Self-pay

## 2016-12-31 MED ORDER — CYPROHEPTADINE HCL 4 MG PO TABS: ORAL_TABLET | ORAL | 0 refills | Status: DC

## 2016-12-31 MED ORDER — MUPIROCIN 2 % EX OINT: 1.0000 "application " | TOPICAL_OINTMENT | Freq: Two times a day (BID) | CUTANEOUS | 0 refills | Status: DC

## 2016-12-31 NOTE — Telephone Encounter (Signed)
May I refill Sir, thank you. 

## 2016-12-31 NOTE — Telephone Encounter (Signed)
Refill x6

## 2016-12-31 NOTE — Telephone Encounter (Signed)
Mrs. Leap nucala came in 12/28/16. It was given to me this morning. I will call pt. And let her know her med. Came in.Will put in nucala smart text. Nothing further needed.

## 2016-12-31 NOTE — Telephone Encounter (Signed)
Arrival Date: 12/28/16   # of Vials: 3  3 month supply Lot #: [id number] (1), [id number] (2) Expiration Date: 10/2018 (1), 01/2019 (2)

## 2016-12-31 NOTE — Telephone Encounter (Signed)
Spoke with pt, aware of recs.  Pt asked several additional questions about headache mgmt, and I advised pt to follow up with her PCP regarding headache mgmt as we are a pulmonary clinic.  Pt expressed understanding.  Nothing further needed.

## 2016-12-31 NOTE — Telephone Encounter (Signed)
Vials: 3 Order Date: 12/26/16 Ship Date: 12/27/16 90 day supply. Busy with pt.s and p/a's, forgot to put order in.

## 2016-12-31 NOTE — Telephone Encounter (Signed)
Created in error

## 2016-12-31 NOTE — Telephone Encounter (Signed)
Tammy please if this has been taken care of.

## 2016-12-31 NOTE — Telephone Encounter (Signed)
Pt. Has decided to start her nucala. It has been approved since 07/2016. She was waiting for her xolair bill to be taken care of before she started nucala. The [employer] has been resolved. She has made her 1st nucala appt. For 01/04/17.  Mrs. Inscore asked about the headache that could be a side effect. She wants to know if there is a inj. She can get to counter a headache. She can only take otc for pain meds. For 2 days, after that her kidneys and liver start act up. She's concerned because she does all the cooking for Thanksgiving, she doesn't want deal with still having a headache too. (possibly)  Please advise.

## 2016-12-31 NOTE — Telephone Encounter (Signed)
There is no injectable pain medication that we can administer in the office to help with pain relief. Headache or otherwise.

## 2016-12-31 NOTE — Telephone Encounter (Signed)
Refilled mirtazapine as approved.

## 2017-01-02 ENCOUNTER — Telehealth: Payer: Self-pay | Admitting: Family Medicine

## 2017-01-02 NOTE — Telephone Encounter (Signed)
Patient calling to ask some questions about her prolia injections [phone]

## 2017-01-03 ENCOUNTER — Telehealth: Payer: Self-pay | Admitting: Pulmonary Disease

## 2017-01-03 NOTE — Telephone Encounter (Signed)
I would hold off on antibiotics for now. Go ahead with the injection. She hasn't wanted to do a repeat sputum culture and I cannot blame her because we haven't grown out anything from multiple previous attempts. For now I would just have her continue to monitor her symptoms and contact us if her volume of mucus increases or she develops any fever, hot/cold chills, or sweats.

## 2017-01-03 NOTE — Telephone Encounter (Signed)
Questions answered.
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
## 2017-01-04 ENCOUNTER — Ambulatory Visit (INDEPENDENT_AMBULATORY_CARE_PROVIDER_SITE_OTHER): Payer: Medicare Other

## 2017-01-04 DIAGNOSIS — J4550 Severe persistent asthma, uncomplicated: Secondary | ICD-10-CM

## 2017-01-04 NOTE — Telephone Encounter (Signed)
Pt is aware of JN's recommendations and voiced her understanding.  Nothing further needed.

## 2017-01-07 DIAGNOSIS — J4550 Severe persistent asthma, uncomplicated: Secondary | ICD-10-CM | POA: Diagnosis not present

## 2017-01-07 MED ORDER — MEPOLIZUMAB 100 MG ~~LOC~~ SOLR
100.0000 mg | SUBCUTANEOUS | Status: DC
  Administered 2017-01-07: 100 mg via SUBCUTANEOUS

## 2017-01-07 NOTE — Progress Notes (Signed)
Documentation of medication administration and charges of Nucala have been completed by Desmond Dike, CMA based on the [facility] documentation sheet completed by Maury Dus, RMA.

## 2017-01-13 ENCOUNTER — Encounter: Payer: Self-pay | Admitting: Pulmonary Disease

## 2017-01-14 ENCOUNTER — Encounter: Payer: Self-pay | Admitting: Family Medicine

## 2017-01-15 ENCOUNTER — Encounter: Payer: Self-pay | Admitting: Allergy and Immunology

## 2017-01-15 ENCOUNTER — Ambulatory Visit: Payer: Medicare Other | Admitting: Allergy and Immunology

## 2017-01-15 VITALS — BP 128/68 | HR 78 | Temp 97.8°F | Resp 19

## 2017-01-15 DIAGNOSIS — J4551 Severe persistent asthma with (acute) exacerbation: Secondary | ICD-10-CM | POA: Diagnosis not present

## 2017-01-15 DIAGNOSIS — G500 Trigeminal neuralgia: Secondary | ICD-10-CM | POA: Diagnosis not present

## 2017-01-15 DIAGNOSIS — J3089 Other allergic rhinitis: Secondary | ICD-10-CM

## 2017-01-15 DIAGNOSIS — J302 Other seasonal allergic rhinitis: Secondary | ICD-10-CM | POA: Diagnosis not present

## 2017-01-15 DIAGNOSIS — K219 Gastro-esophageal reflux disease without esophagitis: Secondary | ICD-10-CM | POA: Diagnosis not present

## 2017-01-15 MED ORDER — METHYLPREDNISOLONE ACETATE 80 MG/ML IJ SUSP
80.0000 mg | Freq: Once | INTRAMUSCULAR | Status: AC
  Administered 2017-01-15: 80 mg via INTRAMUSCULAR

## 2017-01-15 MED ORDER — MONTELUKAST SODIUM 10 MG PO TABS: 10.0000 mg | ORAL_TABLET | Freq: Every day | ORAL | 5 refills | Status: DC

## 2017-01-15 MED ORDER — CEFUROXIME AXETIL 250 MG PO TABS: 250.0000 mg | ORAL_TABLET | Freq: Two times a day (BID) | ORAL | 0 refills | Status: AC

## 2017-01-15 NOTE — Progress Notes (Signed)
Follow-up Note  Referring Provider: [name], MD Primary Provider: [name], MD Date of Office Visit: 01/15/2017  Subjective:   Ebony Scott (DOB: 1950/05/03) is a 65 y.o. female who returns to the [place] on 01/15/2017 in re-evaluation of the following:  HPI: Ebony Scott returns to this clinic in reevaluation of her allergic rhinoconjunctivitis, facial pain syndrome, reflux induced respiratory disease, and asthma followed by Dr. Ashok Cordia.  Her last visit to this clinic was 01 August 2016.  Unfortunately, over the course of the past month or so she has been having progressive problems with her asthma.  She is having more coughing and she is having more dyspnea on exertion and she has been having more issues with nasal congestion and itchy nose and yellow nasal discharge that is occasionally bloody.  She does not have any recurrent fevers or chest pain or significant headaches.  Her facial pain syndrome is under very good control on her current medical therapy.  She uses 8 mg of cyproheptadine every night which also helps with her sleep dysfunction.  She believes that her reflux is okay but she still has some degree of esophageal dysmotility with obstruction with consumption of rice and breads and several other types of foods that occasionally get hung up and take minutes to pass.  Overall prior to this event her asthma was under pretty good control and she has not required either a systemic steroid or an antibiotic to treat any type of respiratory tract issue.  Her chronic problem associated with thrush is under very good control with her daily use of oral nystatin.  She is now using NUCALA to replace her Xolair and has had one injection of NUCALA about 2 weeks ago.  Allergies as of 01/15/2017      Reactions   Dust Mite Extract Shortness Of Breath, Other (See Comments)   "sneezing" (02/20/2012)   Molds & Smuts Shortness Of Breath   Morphine Sulfate Itching   Other Shortness Of Breath, Other (See Comments)   Grass and weeds "sneezing; filled sinuses" (02/20/2012)   Penicillins Rash, Other (See Comments)   "welts" (02/20/2012) Has patient had a PCN reaction causing immediate rash, facial/tongue/throat swelling, SOB or lightheadedness with hypotension: Unknown Has patient had a PCN reaction causing severe rash involving mucus membranes or skin necrosis: No Has patient had a PCN reaction that required hospitalization: No Has patient had a PCN reaction occurring within the last 10 years: No If all of the above answers are "NO", then may proceed with Cephalosporin use.   Reclast [zoledronic Acid] Other (See Comments)   Fever, Put in hospital, dr said it was a reaction from a reaction    Rofecoxib Swelling   Celebrex/vioxx REACTION: feet swelling   Shrimp Flavor Anaphylaxis   ALL SHELLFISH   Tetracycline Hcl Nausea And Vomiting   Xolair [omalizumab] Other (See Comments)   Caused Blood clot   Dilaudid [hydromorphone Hcl] Itching   Hydrocodone-acetaminophen Nausea And Vomiting   Levofloxacin Other (See Comments)   REACTION: GI upset   Oxycodone Hcl Nausea And Vomiting   Paroxetine Nausea And Vomiting   Paxil   Diltiazem Swelling   Tree Extract Other (See Comments)   "tested and told I was allergic to it; never experienced a reaction to it" (02/20/2012)      Medication List      acetaminophen 650 MG CR tablet Commonly known as:  TYLENOL Take 1,300 mg by mouth 2 (two) times  daily.   AEROCHAMBER MV inhaler Use as instructed   ALIGN 4 MG Caps Take 1 tablet by mouth daily.   Alpha-Lipoic Acid 600 MG Caps Take 600 mg by mouth daily.   azelastine 0.1 % nasal spray Commonly known as:  ASTELIN USE 2 SPRAYS IN EACH NOSTRIL DAILY AS DIRECTED   BEANO PO Take 1 capsule by mouth 3 (three) times daily with meals.   CALCIUM CITRATE PLUS/MAGNESIUM PO Take 1 tablet by mouth 2 (two) times daily.   cetirizine 10 MG tablet Commonly known as:   ZYRTEC Take 5 mg by mouth every evening.   colchicine 0.6 MG tablet Take 1 tablet (0.6 mg total) by mouth daily as needed.   cyproheptadine 4 MG tablet Commonly known as:  PERIACTIN Take two tablets every evening   denosumab 60 MG/ML Soln injection Commonly known as:  PROLIA Inject 60 mg into the skin every 6 (six) months. Administer in upper arm, thigh, or abdomen   dexlansoprazole 60 MG capsule Commonly known as:  DEXILANT Take 1 capsule (60 mg total) by mouth daily.   diclofenac 75 MG EC tablet Commonly known as:  VOLTAREN Take 1 tablet (75 mg total) by mouth 2 (two) times daily.   diclofenac sodium 1 % Gel Commonly known as:  VOLTAREN Apply 2 g topically 4 (four) times daily.   EPIPEN 2-PAK IJ Inject as directed.   FLUTTER Devi Use as directed   gabapentin 600 MG tablet Commonly known as:  NEURONTIN Take 1 tablet (600 mg total) by mouth daily.   GELUSIL 200-200-25 MG chewable tablet Generic drug:  alum & mag hydroxide-simeth Chew 1 tablet by mouth every 6 (six) hours as needed for flatulence.   guaifenesin 400 MG Tabs tablet Commonly known as:  HUMIBID E Take 400 mg by mouth 2 (two) times daily.   LACTAID FAST ACT 9000 units Tabs Generic drug:  Lactase Take 18,000 Units by mouth 4 (four) times daily as needed (DAIRY).   lidocaine 5 % Commonly known as:  LIDODERM Place 1 patch onto the skin daily. Remove & Discard patch within 12 hours or as directed by MD   Pancrelipase (Lip-Prot-Amyl) 24000-76000 units Cpep Take 1 capsule by mouth See admin instructions. Takes 1 capsule 3 times daily with meals and occasionally takes 1 capsule with large snacks  24000-76000-120000   lipase/protease/amylase 12000 units Cpep capsule Commonly known as:  CREON Take 1 capsule (12,000 Units total) by mouth 3 (three) times daily. CREON 24000-76000-120000 U Takes with meals   meclizine 25 MG tablet Commonly known as:  ANTIVERT Take 25 mg by mouth every 6 (six) hours as needed  for dizziness.   mirtazapine 30 MG disintegrating tablet Commonly known as:  REMERON SOL-TAB DISOLVE 1 TABLET BY MOUTH EACH NIGHT AT BEDTIME   mometasone 50 MCG/ACT nasal spray Commonly known as:  NASONEX Place 2 sprays into the nose 2 (two) times daily.   mometasone-formoterol 200-5 MCG/ACT Aero Commonly known as:  DULERA Inhale 2 puffs into the lungs 2 (two) times daily.   montelukast 10 MG tablet Commonly known as:  SINGULAIR TAKE 1 TABLET BY MOUTH DAILY   MUCINEX D 60-600 MG 12 hr tablet Generic drug:  pseudoephedrine-guaifenesin TAKE 1 TABLET BY MOUTH EVERY 12 HOURS   mupirocin ointment 2 % Commonly known as:  BACTROBAN Place 1 application into the nose 3 (three) times daily as needed (DRY NOSE).   neomycin-bacitracin-polymyxin ointment Commonly known as:  NEOSPORIN Apply 1 application topically 3 (three) times daily as  needed for wound care. apply to eye   nystatin 100000 UNIT/ML suspension Commonly known as:  MYCOSTATIN USE AS DIRECTED 5MLS IN MOUTH OR THROAT 4 TIMES DAILY   nystatin-triamcinolone ointment Commonly known as:  MYCOLOG Apply 1 application topically 3 (three) times daily as needed (YEAST INFECTION).   pravastatin 20 MG tablet Commonly known as:  PRAVACHOL TAKE 1 TABLET BY MOUTH DAILY   PRESCRIPTION MEDICATION Place 2 puffs into both ears daily as needed. Pt has compounded capsule (powder) that she uses In her ears as needed; CS (X) AHCL    CHLORAMP/SULFU/AMPHO B/ H DROC 100-100-5 mg capsule   PROAIR HFA 108 (90 Base) MCG/ACT inhaler Generic drug:  albuterol Inhale 2 puffs into the lungs every 4 (four) hours as needed for wheezing or shortness of breath.   PROCTO-MED HC 2.5 % rectal cream Generic drug:  hydrocortisone APPLY AS NEEDED AS DIRECTED   propylthiouracil 50 MG tablet Commonly known as:  PTU TAKE 1/2 TABLET BY MOUTH TWICE DAILY   ranitidine 300 MG tablet Commonly known as:  ZANTAC Take 1 tablet (300 mg total) by mouth daily.     SIMPLY SALINE NA Place 1 spray into both nostrils at bedtime.   SOOTHE OP Place 1 drop into both eyes 2 (two) times daily.   SPIRIVA RESPIMAT 2.5 MCG/ACT Aers Generic drug:  Tiotropium Bromide Monohydrate Inhale 2 puffs into the lungs daily.   sulfamethoxazole-trimethoprim 800-160 MG tablet Commonly known as:  BACTRIM DS,SEPTRA DS Take 1 tablet by mouth 2 (two) times daily.   VINATE M 27-1 MG Tabs Take 1 tablet by mouth daily.   Vitamin D3 10000 units Tabs Take 1,000 Units by mouth daily.       Past Medical History:  Diagnosis Date  . Allergic rhinitis   . Allergy    SEASONAL  . Anemia   . Anxiety    pt denies  . Arthritis    "mostly the hands" (02/20/2012)  . Asthma   . Breast cancer ([place]) 1998   in remission  . Cataract    REMOVED  . Chronic lower back pain   . Chronic neck pain   . Complication of anesthesia    "had hard time waking up from it several times" (02/20/2012)  . Depression    "some; don't take anything for it" (02/20/2012)  . Diverticulosis   . DVT (deep venous thrombosis) ([place])   . Exertional dyspnea   . Fibromyalgia 11/2011  . GERD (gastroesophageal reflux disease)   . Graves disease   . Headache(784.0)    "related to allergies; more at different times during the year" (02/20/2012)  . Hemorrhoids   . Hiatal hernia    back and neck  . Hx of adenomatous colonic polyps 04/12/2016  . Hypercholesteremia    good cholesterol is high  . Hypothyroidism   . IBS (irritable bowel syndrome)   . Moderate persistent asthma    -FeV1 72% 2011, -IgE 102 2011, CT sinus Neg 2011  . Osteoporosis    on reclast yearly  . Pneumonia 04/2011; ~ 11/2011   "double; single" (02/20/2012)    Past Surgical History:  Procedure Laterality Date  . ANTERIOR AND POSTERIOR REPAIR  1990's  . APPENDECTOMY    . BREAST LUMPECTOMY  1998   left  . CARPOMETACARPEL ([place]) FUSION OF THUMB WITH AUTOGRAFT FROM RADIUS  ~ 2009   "both thumbs" (02/20/2012)  . CATARACT EXTRACTION W/  INTRAOCULAR LENS  IMPLANT, BILATERAL  2012  . CERVICAL DISCECTOMY  10/2001   C5-C6  . CERVICAL FUSION  2003   C3-C4  . CHOLECYSTECTOMY    . COLONOSCOPY    . DEBRIDEMENT TENNIS ELBOW  ?1970's   right  . ESOPHAGOGASTRODUODENOSCOPY    . HYSTERECTOMY    . KNEE ARTHROPLASTY  ?1990's   "?right; w/cartilage repair" (02/20/2012)  . NASAL SEPTUM SURGERY  1980's  . POSTERIOR CERVICAL FUSION/FORAMINOTOMY  2004   "failed initial fusion; rewired  anterior neck" (02/20/2012)  . TONSILLECTOMY  ~ 1953  . VESICOVAGINAL FISTULA CLOSURE W/ TAH  1988  . VIDEO BRONCHOSCOPY Bilateral 08/23/2016   Procedure: VIDEO BRONCHOSCOPY WITH FLUORO;  Surgeon: Javier Glazier, MD;  Location: [facility];  Service: Cardiopulmonary;  Laterality: Bilateral;    Review of systems negative except as noted in HPI / PMHx or noted below:  Review of Systems  Constitutional: Negative.   HENT: Negative.   Eyes: Negative.   Respiratory: Negative.   Cardiovascular: Negative.   Gastrointestinal: Negative.   Genitourinary: Negative.   Musculoskeletal: Negative.   Skin: Negative.   Neurological: Negative.   Endo/Heme/Allergies: Negative.   Psychiatric/Behavioral: Negative.      Objective:   Vitals:   01/15/17 0928  BP: 128/68  Pulse: 78  Resp: 19  Temp: 97.8 F (36.6 C)  SpO2: 97%          Physical Exam  Constitutional: She is well-developed, well-nourished, and in no distress.  Raspy voice  HENT:  Head: Normocephalic.  Right Ear: Tympanic membrane, external ear and ear canal normal.  Left Ear: Tympanic membrane, external ear and ear canal normal.  Nose: Nose normal. No mucosal edema or rhinorrhea.  Mouth/Throat: Uvula is midline, oropharynx is clear and moist and mucous membranes are normal. No oropharyngeal exudate.  Eyes: Conjunctivae are normal.  Neck: Trachea normal. No tracheal tenderness present. No tracheal deviation present. No thyromegaly present.  Cardiovascular: Normal rate, regular rhythm,  S1 normal, S2 normal and normal heart sounds.  No murmur heard. Pulmonary/Chest: Breath sounds normal. No stridor. No respiratory distress. She has no wheezes. She has no rales.  Musculoskeletal: She exhibits no edema.  Lymphadenopathy:       Head (right side): No tonsillar adenopathy present.       Head (left side): No tonsillar adenopathy present.    She has no cervical adenopathy.  Neurological: She is alert. Gait normal.  Skin: No rash noted. She is not diaphoretic. No erythema. Nails show no clubbing.  Psychiatric: Mood and affect normal.    Diagnostics:    Spirometry was performed and demonstrated an FEV1 of 1.16 at 55 % of predicted.  Following the administration of nebulized albuterol her FEV1 rose to 1.42 which was an increase in the FEV1 of 22%.  The patient had an Asthma Control Test with the following results: ACT Total Score: 10.    Assessment and Plan:   1. Asthma, not well controlled, severe persistent, with acute exacerbation   2. Seasonal and perennial allergic rhinitis   3. Facial pain syndrome   4. LPRD (laryngopharyngeal reflux disease)     1. 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 Continue Periactin 4 mg tablet  2 tablets at bedtime  2. 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 Continue Dexilant 60 mg in the morning  B. Continue ranitidine 300 mg in the evening  3. Continue to treat inflammation:   A. Continue Dulera 200 2 inhalations twice a day  B. Continue Nasonex one spray each nostril  1-2 times a day   C. Continue montelukast 10 mg daily  D. Spiriva respimat 2 inhalations one time per day  4. Continue to prevent thrush:   A. nystatin swish and spit twice a day  5. Continue Nucala and EpiPen as prescribed by Dr. Ashok Cordia  6. Use bronchodilator and Astelin and Zyrtec and nasal saline and nasal bactroban if needed  7. For this recent episode:   A. Depomedrol 80 IM delivered in clinic today  B. Ceftin 250 one tablet  two times per day for 10 days only  C.  Nebulized albuterol delivered in clinic today  8. Return to clinic in 6 months or earlier if problem   I am going to treat Archer for a exacerbation of her airway disease triggered by an infection with a systemic steroid and antibiotic as noted above and I will make the assumption that she will do well with this therapy as long as she continues to utilize a large collection of anti-inflammatory medications for her respiratory tract and treatment directed against reflux in addition to use of her biological agent.  She will continue on therapy for her facial pain syndrome and sleep dysfunction and also continue to prevent thrush with the therapy noted above.  I will see her back in this clinic in 6 months or earlier if there is a problem.  Allena Katz, MD Allergy / Immunology [place]

## 2017-01-15 NOTE — Patient Instructions (Addendum)
  1. 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 Continue Periactin 4 mg tablet  2 tablets at bedtime  2. 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 Continue Dexilant 60 mg in the morning  B. Continue ranitidine 300 mg in the evening  4. Continue to treat inflammation:   A. Continue Dulera 200 2 inhalations twice a day  B. Continue Nasonex one spray each nostril 1-2 times a day   C. Continue montelukast 10 mg daily  D. Spiriva respimat 2 inhalations one time per day  5. Continue to prevent thrush:   A. nystatin swish and spit twice a day  6. Continue Nucala and EpiPen as prescribed by Dr. Ashok Cordia  7. Use bronchodilator and Astelin and Zyrtec and nasal saline and nasal bactroban if needed  8. For this recent episode:   A. Depomedrol 80 IM delivered in clinic today  B. Ceftin 250 one tablet two times per day for 10 days only  C.  Nebulized albuterol delivered in clinic today  9. Return to clinic in 6 months or earlier if problem

## 2017-01-16 ENCOUNTER — Encounter: Payer: Self-pay | Admitting: Allergy and Immunology

## 2017-01-16 ENCOUNTER — Telehealth: Payer: Self-pay | Admitting: Allergy and Immunology

## 2017-01-16 NOTE — Telephone Encounter (Signed)
Patient received and antibiotic yesterday and a steroid shot - but it was never determined if she had pneumonia or not Does she?? Please call patient to answer any questions

## 2017-01-17 ENCOUNTER — Other Ambulatory Visit: Payer: Self-pay | Admitting: Allergy and Immunology

## 2017-01-17 ENCOUNTER — Other Ambulatory Visit: Payer: Self-pay | Admitting: Family Medicine

## 2017-01-17 ENCOUNTER — Encounter: Payer: Self-pay | Admitting: Family Medicine

## 2017-01-17 MED ORDER — MOMETASONE FURO-FORMOTEROL FUM 200-5 MCG/ACT IN AERO: 2.0000 | INHALATION_SPRAY | Freq: Two times a day (BID) | RESPIRATORY_TRACT | 3 refills | Status: DC

## 2017-01-17 MED ORDER — MUPIROCIN 2 % EX OINT: 1.0000 "application " | TOPICAL_OINTMENT | Freq: Three times a day (TID) | CUTANEOUS | 1 refills | Status: DC | PRN

## 2017-01-17 MED ORDER — DEXLANSOPRAZOLE 60 MG PO CPDR: 60.0000 mg | DELAYED_RELEASE_CAPSULE | Freq: Every day | ORAL | 2 refills | Status: DC

## 2017-01-17 MED ORDER — CYPROHEPTADINE HCL 4 MG PO TABS: ORAL_TABLET | ORAL | 0 refills | Status: DC

## 2017-01-17 MED ORDER — METHIMAZOLE 5 MG PO TABS: 5.0000 mg | ORAL_TABLET | Freq: Three times a day (TID) | ORAL | 1 refills | Status: DC

## 2017-01-17 NOTE — Telephone Encounter (Signed)
Please advise 

## 2017-01-17 NOTE — Telephone Encounter (Signed)
Please inform patient that there did not appear to be a pneumonia during her visit. A pneumonia means that one area of her lungs was involved and it appeared that most of her respiratory tracy was involved rather than just one area.

## 2017-01-17 NOTE — Telephone Encounter (Signed)
Received fax for several medication refills, patient was seen in office this week. Medications sent.

## 2017-01-17 NOTE — Telephone Encounter (Signed)
Spoke to patient advised of Kozlow's advise

## 2017-01-24 ENCOUNTER — Telehealth: Payer: Self-pay

## 2017-01-24 MED ORDER — AZITHROMYCIN 500 MG PO TABS: 500.0000 mg | ORAL_TABLET | Freq: Every day | ORAL | 0 refills | Status: DC

## 2017-01-24 NOTE — Telephone Encounter (Signed)
Dr Kozlow please advise 

## 2017-01-24 NOTE — Telephone Encounter (Signed)
Please have patient add azithromycin 500 mg one time per day for three days to her plan and see what happens at the end of 10 days

## 2017-01-24 NOTE — Telephone Encounter (Signed)
Patient was seen on 01/15/2017 by Dr. Neldon Mc. She is still not feeling well. She feels like she is still not breathing well. She has taken the 10 day round of antibiotics. She is wondering what else should she do.  Please Advise

## 2017-01-24 NOTE — Telephone Encounter (Signed)
Spoke to patient advised of plan prescription sent in

## 2017-01-29 ENCOUNTER — Ambulatory Visit: Payer: Medicare Other | Admitting: Allergy and Immunology

## 2017-02-01 ENCOUNTER — Ambulatory Visit (INDEPENDENT_AMBULATORY_CARE_PROVIDER_SITE_OTHER): Payer: Medicare Other

## 2017-02-01 DIAGNOSIS — J4550 Severe persistent asthma, uncomplicated: Secondary | ICD-10-CM | POA: Diagnosis not present

## 2017-02-04 MED ORDER — MEPOLIZUMAB 100 MG ~~LOC~~ SOLR
100.0000 mg | SUBCUTANEOUS | Status: DC
  Administered 2017-02-01: 100 mg via SUBCUTANEOUS

## 2017-02-04 NOTE — Progress Notes (Signed)
Documentation of medication administration and charges of Nucala have been completed by Lashaun Krapf, CMA based on the Nucala documentation sheet completed by Tammy Scott.   

## 2017-02-08 ENCOUNTER — Telehealth: Payer: Self-pay | Admitting: Adult Health

## 2017-02-08 ENCOUNTER — Ambulatory Visit: Payer: Medicare Other | Admitting: Adult Health

## 2017-02-08 ENCOUNTER — Encounter: Payer: Self-pay | Admitting: Adult Health

## 2017-02-08 DIAGNOSIS — J302 Other seasonal allergic rhinitis: Secondary | ICD-10-CM | POA: Diagnosis not present

## 2017-02-08 DIAGNOSIS — J4550 Severe persistent asthma, uncomplicated: Secondary | ICD-10-CM | POA: Diagnosis not present

## 2017-02-08 DIAGNOSIS — K219 Gastro-esophageal reflux disease without esophagitis: Secondary | ICD-10-CM | POA: Diagnosis not present

## 2017-02-08 DIAGNOSIS — J3089 Other allergic rhinitis: Secondary | ICD-10-CM | POA: Diagnosis not present

## 2017-02-08 NOTE — Progress Notes (Signed)
@Patient  ID: Ebony Scott, female    DOB: [birth year], 66 y.o.   MRN: [id number]  Chief Complaint  Patient presents with  . Follow-up    Asthma     Referring provider: [name], MD  HPI: 66 year old female followed for severe persistent asthma chronic rhinitis and abnormal CT chest  TEST  PFT 08/08/15: FVC 2.08 L (75%) FEV1 1.73 L (82%) FEV1/FVC 0.83 FEF 25-75 2.41 L (129%) no bronchodilator response                                                                 DLCO corrected 66% (hemoglobin 10.5) 04/29/15: FVC 2.24 L (81%) FEV1 1.95 L (92%) FEV1/FVC 0.87 FEF 25-75 2.59 L (137%) no bronchodilator response TLC 3.91 L (84%) RV 83% ERV 61% DLCO corrected 62% (hemoglobin 13.7) 04/10/12:FVC 1.90 L (69%) FEV1 1.49 L (75%) FEV1/FVC 0.78 FEF 25-75 1.39 L (59%)                                                TLC 3.55 L (80%) RV 62% ERV 52% DLCO uncorrected 72%  6MWT 08/08/15:  Walked 267 meters / Baseline Sat 99% on RA / Nadir Sat 97% on RA  IMAGING CTA CHEST 04/15/16 :  No pulmonary embolism. Dependent subpleural atelectasis in the lower lobes. Patient does have patchy nodular opacification within right lower lobe. No pleural effusion appreciated. No pathologic mediastinal adenopathy. No pericardial effusion.  CT CHEST W/O 11/03/15 : No pleural effusion or thickening. No pathologic mediastinal adenopathy. No pericardial effusion. Tree-in-bud opacification within right upper lobe as well as patchy opacification linearly in right middle lobe and inferior lingula.  CT MAXILLOFACIAL W/O 08/12/15 ( Paranasal sinuses clear without mucosal thickening or air-fluid level.  CT MAXILLOFACIAL 05/21/13 (): Negative for chronic sinus disease.  CT CHEST W/O 06/05/12 (): Resolution of previous right apical ground glass. Persistent tree-in-bud nodularity within basal portion RUL.  MICROBIOLOGY Sputum Ctx (11/22/15):  Normal Oral Flora / AFB negative / Fungus negative  Sputum Ctx (09/06/15):   Abundant Squamous Epithelials / Oral Flora / Mycobacterium gordonae / Fungus negative   LABS 12/07/15 CBC:  6.5/11.9/36.0/228 Eos:  455  11/09/14 BMP: 141/4.4/104/27/27/0.98/82/9.3 LFT: 4.1/6.5/0.08/17/28/31 CBC: 6.7/12.4/36.9/233  05/31/14 URINE TOX SCREEN: NEGATIVE  12/16/12 IgE: 206.6  04/01/12 RAST PANEL: RAGWEED 0.14 (class 0/1) IgE: 409.9 ASPERGILLUS ANTIGEN: 1.92  01/02/12 RAST PANEL: Weakly Pan-Positive Up to Class 1 IgE: 825.8  06/28/11 IgG: 623 IgA: 134   02/08/2017 Follow up : Asthma /Chronic rhinitis  Patient presents for a follow-up.  She was last seen March 2018.  Patient has known severe persistent asthma is on  [place].. She is on Nucala injections. Started Nov 2018 . Has not seen much change . Gets winded easily  Has asthma flare last month , tx with abx and steroids by allergist.  Cough and wheezing are resolved. But still feels weak and gets winded .  No fever, chest pain . Edema   Allergies  Allergen Reactions  . Dust Mite Extract Shortness Of Breath and Other (See Comments)    "sneezing" (02/20/2012)  . Molds &  Smuts Shortness Of Breath  . Morphine Sulfate Itching  . Other Shortness Of Breath and Other (See Comments)    Grass and weeds "sneezing; filled sinuses" (02/20/2012)  . Penicillins Rash and Other (See Comments)    "welts" (02/20/2012) Has patient had a PCN reaction causing immediate rash, facial/tongue/throat swelling, SOB or lightheadedness with hypotension: Unknown Has patient had a PCN reaction causing severe rash involving mucus membranes or skin necrosis: No Has patient had a PCN reaction that required hospitalization: No Has patient had a PCN reaction occurring within the last 10 years: No If all of the above answers are "NO", then may proceed with Cephalosporin use.   Marland Kitchen Reclast [Zoledronic Acid] Other (See Comments)    Fever, Put in hospital, dr said it was a reaction from a reaction   . Rofecoxib Swelling      Celebrex/vioxx REACTION: feet swelling  . Shrimp Flavor Anaphylaxis    ALL SHELLFISH  . Tetracycline Hcl Nausea And Vomiting  . Xolair [Omalizumab] Other (See Comments)    Caused Blood clot  . Dilaudid [Hydromorphone Hcl] Itching  . Hydrocodone-Acetaminophen Nausea And Vomiting  . Levofloxacin Other (See Comments)    REACTION: GI upset  . Oxycodone Hcl Nausea And Vomiting  . Paroxetine Nausea And Vomiting    Paxil   . Diltiazem Swelling  . Tree Extract Other (See Comments)    "tested and told I was allergic to it; never experienced a reaction to it" (02/20/2012)    Immunization History  Administered Date(s) Administered  . DTaP 08/18/2013  . Hepatitis A 09/04/2007, 03/02/2008  . Hepatitis B 01/07/1985, 02/06/1985, 08/17/1985  . Influenza Split 11/13/2010, 11/22/2011, 10/20/2012  . Influenza Whole 11/14/2009, 11/21/2011  . Influenza, High Dose Seasonal PF 11/09/2015  . Influenza,inj,Quad PF,6+ Mos 11/06/2013, 11/09/2014, 11/06/2016  . Meningococcal Conjugate 09/04/2007  . Pneumococcal Conjugate-13 05/18/2013  . Pneumococcal Polysaccharide-23 01/05/1994, 11/28/2011  . Td 07/24/1995, 03/16/2005  . Tdap 08/18/2013  . Zoster 03/02/2008    Past Medical History:  Diagnosis Date  . Allergic rhinitis   . Allergy    SEASONAL  . Anemia   . Anxiety    pt denies  . Arthritis    "mostly the hands" (02/20/2012)  . Asthma   . Breast cancer ([place]) 1998   in remission  . Cataract    REMOVED  . Chronic lower back pain   . Chronic neck pain   . Complication of anesthesia    "had hard time waking up from it several times" (02/20/2012)  . Depression    "some; don't take anything for it" (02/20/2012)  . Diverticulosis   . DVT (deep venous thrombosis) ([place])   . Exertional dyspnea   . Fibromyalgia 11/2011  . GERD (gastroesophageal reflux disease)   . Graves disease   . Headache(784.0)    "related to allergies; more at different times during the year" (02/20/2012)  . Hemorrhoids   .  Hiatal hernia    back and neck  . Hx of adenomatous colonic polyps 04/12/2016  . Hypercholesteremia    good cholesterol is high  . Hypothyroidism   . IBS (irritable bowel syndrome)   . Moderate persistent asthma    -FeV1 72% 2011, -IgE 102 2011, CT sinus Neg 2011  . Osteoporosis    on reclast yearly  . Pneumonia 04/2011; ~ 11/2011   "double; single" (02/20/2012)    Tobacco History: Social History   Tobacco Use  Smoking Status Passive Smoke Exposure - Never Smoker  Smokeless Tobacco Never  Used  Tobacco Comment   Parents   Counseling given: Not Answered Comment: Parents   Outpatient Encounter Medications as of 02/08/2017  Medication Sig  . acetaminophen (TYLENOL) 650 MG CR tablet Take 1,300 mg by mouth 2 (two) times daily.  Marland Kitchen albuterol (PROAIR HFA) 108 (90 Base) MCG/ACT inhaler Inhale 2 puffs into the lungs every 4 (four) hours as needed for wheezing or shortness of breath.  . Alpha-D-Galactosidase (BEANO PO) Take 1 capsule by mouth 3 (three) times daily with meals.  . Alpha-Lipoic Acid 600 MG CAPS Take 600 mg by mouth daily.   Marland Kitchen alum & mag hydroxide-simeth (GELUSIL) 200-200-25 MG chewable tablet Chew 1 tablet by mouth every 6 (six) hours as needed for flatulence.  Marland Kitchen azelastine (ASTELIN) 0.1 % nasal spray USE 2 SPRAYS IN EACH NOSTRIL DAILY AS DIRECTED  . cetirizine (ZYRTEC) 10 MG tablet Take 5 mg by mouth every evening.   . Cholecalciferol (VITAMIN D3) 10000 units TABS Take 1,000 Units by mouth daily.  . colchicine 0.6 MG tablet Take 1 tablet (0.6 mg total) by mouth daily as needed.  . cyproheptadine (PERIACTIN) 4 MG tablet Take two tablets every evening  . denosumab (PROLIA) 60 MG/ML SOLN injection Inject 60 mg into the skin every 6 (six) months. Administer in upper arm, thigh, or abdomen  . dexlansoprazole (DEXILANT) 60 MG capsule Take 1 capsule (60 mg total) by mouth daily.  . diclofenac (VOLTAREN) 75 MG EC tablet Take 1 tablet (75 mg total) by mouth 2 (two) times daily.  .  diclofenac sodium (VOLTAREN) 1 % GEL Apply 2 g topically 4 (four) times daily.  Marland Kitchen EPINEPHrine (EPIPEN 2-PAK IJ) Inject as directed.  . gabapentin (NEURONTIN) 600 MG tablet Take 1 tablet (600 mg total) by mouth daily.  Marland Kitchen guaifenesin (HUMIBID E) 400 MG TABS tablet Take 400 mg by mouth 2 (two) times daily.  . Lactase (LACTAID FAST ACT) 9000 units TABS Take 18,000 Units by mouth 4 (four) times daily as needed (DAIRY).  Marland Kitchen lidocaine (LIDODERM) 5 % Place 1 patch onto the skin daily. Remove & Discard patch within 12 hours or as directed by MD  . lipase/protease/amylase (CREON) 12000 units CPEP capsule Take 1 capsule (12,000 Units total) by mouth 3 (three) times daily. CREON 24000-76000-120000 U Takes with meals  . meclizine (ANTIVERT) 25 MG tablet Take 25 mg by mouth every 6 (six) hours as needed for dizziness.  . methimazole (TAPAZOLE) 5 MG tablet Take 1 tablet (5 mg total) by mouth 3 (three) times daily.  . mirtazapine (REMERON SOL-TAB) 30 MG disintegrating tablet DISOLVE 1 TABLET BY MOUTH EACH NIGHT AT BEDTIME  . mometasone (NASONEX) 50 MCG/ACT nasal spray Place 2 sprays into the nose 2 (two) times daily.  . mometasone-formoterol (DULERA) 200-5 MCG/ACT AERO Inhale 2 puffs into the lungs 2 (two) times daily.  . montelukast (SINGULAIR) 10 MG tablet Take 1 tablet (10 mg total) by mouth daily.  Marland Kitchen MUCINEX D 60-600 MG 12 hr tablet TAKE 1 TABLET BY MOUTH EVERY 12 HOURS  . Multiple Minerals-Vitamins (CALCIUM CITRATE PLUS/MAGNESIUM PO) Take 1 tablet by mouth 2 (two) times daily.  . mupirocin ointment (BACTROBAN) 2 % Place 1 application into the nose 3 (three) times daily as needed (DRY NOSE).  Marland Kitchen neomycin-bacitracin-polymyxin (NEOSPORIN) ointment Apply 1 application topically 3 (three) times daily as needed for wound care. apply to eye  . nystatin (MYCOSTATIN) 100000 UNIT/ML suspension USE AS DIRECTED 5MLS IN MOUTH OR THROAT 4 TIMES DAILY  . nystatin-triamcinolone ointment (MYCOLOG) Apply  1 application topically 3  (three) times daily as needed (YEAST INFECTION).   Marland Kitchen Pancrelipase, Lip-Prot-Amyl, 24000-76000 units CPEP Take 1 capsule by mouth See admin instructions. Takes 1 capsule 3 times daily with meals and occasionally takes 1 capsule with large snacks  24000-76000-120000  . pravastatin (PRAVACHOL) 20 MG tablet TAKE 1 TABLET BY MOUTH DAILY  . Prenatal Vit-Sel-Fe Fum-FA (VINATE M) 27-1 MG TABS Take 1 tablet by mouth daily.  Marland Kitchen PRESCRIPTION MEDICATION Place 2 puffs into both ears daily as needed. Pt has compounded capsule (powder) that she uses In her ears as needed; CS (X) AHCL    CHLORAMP/SULFU/AMPHO B/ H DROC 100-100-5 mg capsule  . Probiotic Product (ALIGN) 4 MG CAPS Take 1 tablet by mouth daily.   Marland Kitchen PROCTO-MED HC 2.5 % rectal cream APPLY AS NEEDED AS DIRECTED  . Propylene Glycol-Glycerin (SOOTHE OP) Place 1 drop into both eyes 2 (two) times daily.   Marland Kitchen propylthiouracil (PTU) 50 MG tablet TAKE 1/2 TABLET BY MOUTH TWICE DAILY  . ranitidine (ZANTAC) 300 MG tablet Take 1 tablet (300 mg total) by mouth daily.  Marland Kitchen Respiratory Therapy Supplies (FLUTTER) DEVI Use as directed  . SIMPLY SALINE NA Place 1 spray into both nostrils at bedtime.  Marland Kitchen Spacer/Aero-Holding Chambers (AEROCHAMBER MV) inhaler Use as instructed  . sulfamethoxazole-trimethoprim (BACTRIM DS,SEPTRA DS) 800-160 MG tablet Take 1 tablet by mouth 2 (two) times daily.  . Tiotropium Bromide Monohydrate (SPIRIVA RESPIMAT) 2.5 MCG/ACT AERS Inhale 2 puffs into the lungs daily.  . [DISCONTINUED] azithromycin (ZITHROMAX) 500 MG tablet Take 1 tablet (500 mg total) by mouth daily. (Patient not taking: Reported on 02/08/2017)   Facility-Administered Encounter Medications as of 02/08/2017  Medication  . Mepolizumab SOLR 100 mg  . Mepolizumab SOLR 100 mg     Review of Systems  Constitutional:   No  weight loss, night sweats,  Fevers, chills, fatigue, or  lassitude.  HEENT:   No headaches,  Difficulty swallowing,  Tooth/dental problems, or  Sore throat,                 No sneezing, itching, ear ache,  +nasal congestion, post nasal drip,   CV:  No chest pain,  Orthopnea, PND, swelling in lower extremities, anasarca, dizziness, palpitations, syncope.   GI  No heartburn, indigestion, abdominal pain, nausea, vomiting, diarrhea, change in bowel habits, loss of appetite, bloody stools.   Resp: .  No excess mucus, no productive cough,  No non-productive cough,  No coughing up of blood.  No change in color of mucus.  No wheezing.  No chest wall deformity  Skin: no rash or lesions.  GU: no dysuria, change in color of urine, no urgency or frequency.  No flank pain, no hematuria   MS:  No joint pain or swelling.  No decreased range of motion.  No back pain.    Physical Exam  BP 122/70 (BP Location: Right Arm, Cuff Size: Normal)   Pulse 78   Ht 5' 1.5" (1.562 m)   Wt 134 lb 3.2 oz (60.9 kg)   SpO2 97%   BMI 24.95 kg/m    GEN: A/Ox3; pleasant , NAD, well nourished    HEENT:  [place]/AT,  EACs-clear, TMs-wnl, NOSE-clear, THROAT-clear, no lesions, no postnasal drip or exudate noted.   NECK:  Supple w/ fair ROM; no JVD; normal carotid impulses w/o bruits; no thyromegaly or nodules palpated; no lymphadenopathy.    RESP  Clear  P & A; w/o, wheezes/ rales/ or rhonchi. no accessory  muscle use, no dullness to percussion  CARD:  RRR, no m/r/g, no peripheral edema, pulses intact, no cyanosis or clubbing.  GI:   Soft & nt; nml bowel sounds; no organomegaly or masses detected.   Musco: Warm bil, no deformities or joint swelling noted.   Neuro: alert, no focal deficits noted.    Skin: Warm, no lesions or rashes    Lab Results:  CBC  BMET   BNP    Component Value Date/Time   BNP 15.6 04/15/2016 1700    ProBNP No results found for: PROBNP  Imaging: No results found.   Assessment & Plan:   Severe persistent asthma Recent flare now resolving   Plan Patient Instructions  Continue on Dulera and Spiriva .  Continue on Nucala  injrections  Follow up with Dr. Lake Bells in 3 months and As needed  .      Seasonal and perennial allergic rhinitis Cont regimen  LPRD (laryngopharyngeal reflux disease) Cont on gerd diet and rx      Rexene Edison, NP 02/08/2017

## 2017-02-08 NOTE — Telephone Encounter (Signed)
Patient came to the office today for follow up w/ TP Brought patient assistance forms for her Nasonex - company requires it to be sent AFTER 12.31.18 for it to be valid for 2019  Forms placed in TP's lookat for signature and will send after the new year

## 2017-02-08 NOTE — Progress Notes (Signed)
Reviewed, agree 

## 2017-02-08 NOTE — Patient Instructions (Addendum)
Continue on Dulera and Spiriva .  Continue on Nucala injrections  Follow up with Dr. Lake Bells in 3 months and As needed  .

## 2017-02-08 NOTE — Assessment & Plan Note (Signed)
Recent flare now resolving   Plan Patient Instructions  Continue on Dulera and Spiriva .  Continue on Nucala injrections  Follow up with Dr. Lake Bells in 3 months and As needed  .

## 2017-02-08 NOTE — Telephone Encounter (Signed)
Nasonex patient assistance forms signed by TP Will keep in my lookat until after the new year as described below

## 2017-02-08 NOTE — Assessment & Plan Note (Signed)
Cont regimen

## 2017-02-08 NOTE — Assessment & Plan Note (Signed)
Cont on gerd diet and rx

## 2017-02-08 NOTE — Addendum Note (Signed)
Addended by: Parke Poisson E on: 02/08/2017 12:45 PM   Modules accepted: Orders

## 2017-02-10 ENCOUNTER — Encounter: Payer: Self-pay | Admitting: Adult Health

## 2017-02-11 ENCOUNTER — Other Ambulatory Visit: Payer: Self-pay | Admitting: Family Medicine

## 2017-02-11 ENCOUNTER — Telehealth: Payer: Self-pay

## 2017-02-11 NOTE — Telephone Encounter (Signed)
Hi Tammy,  Thanks for seeing me this week.   I have been trying to reapply for the [employer]. The asthma grant is still closed. I am supposed to get an email letting me know as soon as it opens, so I can reapply.   It would cover the copay for my Nucala and my Sprivia Resp.   I have been so blessed to have been accepted by this and the [employer].  When I called this week, the [employer] said that my [employer] should cover my Dulera, for no cost, too.  I gave your nurse the application for the [employer] and only listed the Nasonex at 6 for each refill.  I will need to do the same and for my Duler 4 per refill.  Can you please add Ruthe Mannan to my application and mail in on Feb 20, 2017?  Thanks so much for all your help. I pray that you have a very Merry Christmas and a blessed and Happy New Year.  Have a very blessed day,   Ebony Scott

## 2017-02-14 ENCOUNTER — Ambulatory Visit (INDEPENDENT_AMBULATORY_CARE_PROVIDER_SITE_OTHER): Payer: Medicare Other | Admitting: *Deleted

## 2017-02-14 ENCOUNTER — Other Ambulatory Visit: Payer: Self-pay

## 2017-02-14 DIAGNOSIS — M810 Age-related osteoporosis without current pathological fracture: Secondary | ICD-10-CM | POA: Diagnosis not present

## 2017-02-14 MED ORDER — DENOSUMAB 60 MG/ML ~~LOC~~ SOLN
60.0000 mg | Freq: Once | SUBCUTANEOUS | Status: AC
  Administered 2017-02-14: 60 mg via SUBCUTANEOUS

## 2017-02-14 NOTE — Telephone Encounter (Signed)
Dulera 200 added to the [employer] Patient Assistance form pt brought to her last ov Forms still in my lookat folder to be sent after Jan 1st 2019 Pt is aware the medication has been added Routing back to myself for continued follow up

## 2017-02-14 NOTE — Telephone Encounter (Signed)
Dulera 200 has been added to the [employer] Patient Assistance forms Forms still in my lookat to be faxed after Jan 1st 2019 E-mail sent to patient to make her aware

## 2017-02-15 ENCOUNTER — Ambulatory Visit: Payer: Medicare Other

## 2017-02-18 ENCOUNTER — Telehealth: Payer: Self-pay | Admitting: Family Medicine

## 2017-02-18 ENCOUNTER — Other Ambulatory Visit: Payer: Self-pay | Admitting: Family Medicine

## 2017-02-18 MED ORDER — ALBUTEROL SULFATE HFA 108 (90 BASE) MCG/ACT IN AERS: 2.0000 | INHALATION_SPRAY | RESPIRATORY_TRACT | 5 refills | Status: DC | PRN

## 2017-02-18 NOTE — Telephone Encounter (Signed)
Medication called/sent to requested pharmacy  

## 2017-02-18 NOTE — Telephone Encounter (Signed)
Pt also needs nystatin called in

## 2017-02-18 NOTE — Telephone Encounter (Signed)
Pt needs refill on gabapentin and proair sent to [facility]

## 2017-02-19 DIAGNOSIS — I739 Peripheral vascular disease, unspecified: Secondary | ICD-10-CM

## 2017-02-19 HISTORY — DX: Peripheral vascular disease, unspecified: I73.9

## 2017-02-20 ENCOUNTER — Other Ambulatory Visit: Payer: Medicare Other

## 2017-02-20 DIAGNOSIS — E785 Hyperlipidemia, unspecified: Secondary | ICD-10-CM

## 2017-02-20 DIAGNOSIS — Z79899 Other long term (current) drug therapy: Secondary | ICD-10-CM

## 2017-02-20 LAB — CBC WITH DIFFERENTIAL/PLATELET
Basophils Absolute: 38 cells/uL (ref 0–200)
Basophils Relative: 0.6 %
Eosinophils Absolute: 38 cells/uL (ref 15–500)
Eosinophils Relative: 0.6 %
HCT: 37.9 % (ref 35.0–45.0)
Hemoglobin: 12.8 g/dL (ref 11.7–15.5)
Lymphs Abs: 2234 cells/uL (ref 850–3900)
MCH: 32.9 pg (ref 27.0–33.0)
MCHC: 33.8 g/dL (ref 32.0–36.0)
MCV: 97.4 fL (ref 80.0–100.0)
MPV: 10.6 fL (ref 7.5–12.5)
Monocytes Relative: 14.8 %
Neutro Abs: 3142 cells/uL (ref 1500–7800)
Neutrophils Relative %: 49.1 %
Platelets: 230 10*3/uL (ref 140–400)
RBC: 3.89 10*6/uL (ref 3.80–5.10)
RDW: 13 % (ref 11.0–15.0)
Total Lymphocyte: 34.9 %
WBC mixed population: 947 cells/uL (ref 200–950)
WBC: 6.4 10*3/uL (ref 3.8–10.8)

## 2017-02-20 LAB — COMPREHENSIVE METABOLIC PANEL
AG Ratio: 1.9 (calc) (ref 1.0–2.5)
ALT: 45 U/L — ABNORMAL HIGH (ref 6–29)
AST: 49 U/L — ABNORMAL HIGH (ref 10–35)
Albumin: 4.3 g/dL (ref 3.6–5.1)
Alkaline phosphatase (APISO): 32 U/L — ABNORMAL LOW (ref 33–130)
BUN/Creatinine Ratio: 18 (calc) (ref 6–22)
BUN: 20 mg/dL (ref 7–25)
CO2: 27 mmol/L (ref 20–32)
Calcium: 9.5 mg/dL (ref 8.6–10.4)
Chloride: 106 mmol/L (ref 98–110)
Creat: 1.13 mg/dL — ABNORMAL HIGH (ref 0.50–0.99)
Globulin: 2.3 g/dL (calc) (ref 1.9–3.7)
Glucose, Bld: 85 mg/dL (ref 65–99)
Potassium: 4.5 mmol/L (ref 3.5–5.3)
Sodium: 141 mmol/L (ref 135–146)
Total Bilirubin: 0.9 mg/dL (ref 0.2–1.2)
Total Protein: 6.6 g/dL (ref 6.1–8.1)

## 2017-02-20 LAB — LIPID PANEL
Cholesterol: 278 mg/dL — ABNORMAL HIGH (ref <200)
HDL: 89 mg/dL (ref >50)
LDL Cholesterol (Calc): 159 mg/dL (calc) — ABNORMAL HIGH
Non-HDL Cholesterol (Calc): 189 mg/dL (calc) — ABNORMAL HIGH (ref <130)
Total CHOL/HDL Ratio: 3.1 (calc) (ref <5.0)
Triglycerides: 165 mg/dL — ABNORMAL HIGH (ref <150)

## 2017-02-21 ENCOUNTER — Encounter: Payer: Self-pay | Admitting: *Deleted

## 2017-02-21 NOTE — Telephone Encounter (Signed)
Forms have been faxed to [employer] sent to patient letting her know

## 2017-02-25 ENCOUNTER — Ambulatory Visit: Payer: Medicare Other | Admitting: Family Medicine

## 2017-02-25 ENCOUNTER — Encounter: Payer: Self-pay | Admitting: Family Medicine

## 2017-02-25 VITALS — BP 140/80 | HR 82 | Temp 98.1°F | Resp 16 | Ht 62.0 in | Wt 134.0 lb

## 2017-02-25 DIAGNOSIS — E0590 Thyrotoxicosis, unspecified without thyrotoxic crisis or storm: Secondary | ICD-10-CM | POA: Diagnosis not present

## 2017-02-25 DIAGNOSIS — E7800 Pure hypercholesterolemia, unspecified: Secondary | ICD-10-CM

## 2017-02-25 LAB — TSH: TSH: 2.86 mIU/L (ref 0.40–4.50)

## 2017-02-25 MED ORDER — DICLOFENAC SODIUM 75 MG PO TBEC: 75.0000 mg | DELAYED_RELEASE_TABLET | Freq: Two times a day (BID) | ORAL | 0 refills | Status: DC

## 2017-02-25 MED ORDER — ATORVASTATIN CALCIUM 20 MG PO TABS: 20.0000 mg | ORAL_TABLET | Freq: Every day | ORAL | 3 refills | Status: DC

## 2017-02-25 MED ORDER — DIAZEPAM 5 MG PO TABS: 5.0000 mg | ORAL_TABLET | Freq: Every evening | ORAL | 0 refills | Status: DC | PRN

## 2017-02-25 NOTE — Progress Notes (Signed)
Subjective:    Patient ID: Ebony Scott, female    DOB: 1950-04-05, 67 y.o.   MRN: [id number]  HPI Patient has a history of hyper thyroidism treated with PTU. Recently has switched to methimazole 5 mg 3 times a day. Since making the change, the patient reports weight gain although she has been on prednisone for her asthma in addition she also reports fatigue and decreased concentration with mild memory problems. She is due to recheck a TSH to see if her thyroid function is being overly suppressed by methimazole.  She is also on pravastatin 20 mg a day for hyperlipidemia. Her most recent liver first test and fasting lipid panel as listed below: Appointment on 02/20/2017  Component Date Value Ref Range Status  . Cholesterol 02/20/2017 278* <200 mg/dL Final  . HDL 02/20/2017 89  >50 mg/dL Final  . Triglycerides 02/20/2017 165* <150 mg/dL Final  . LDL Cholesterol (Calc) 02/20/2017 159* mg/dL (calc) Final   Comment: Reference range: <100 . Desirable range <100 mg/dL for primary prevention;   <70 mg/dL for patients with CHD or diabetic patients  with > or = 2 CHD risk factors. Marland Kitchen LDL-C is now calculated using the Martin-Hopkins  calculation, which is a validated novel method providing  better accuracy than the Friedewald equation in the  estimation of LDL-C.  Cresenciano Genre et al. Annamaria Helling. 2229;798(92): 2061-2068  (http://education.[facility].com/faq/FAQ164)   . Total CHOL/HDL Ratio 02/20/2017 3.1  <5.0 (calc) Final  . Non-HDL Cholesterol (Calc) 02/20/2017 189* <130 mg/dL (calc) Final   Comment: For patients with diabetes plus 1 major ASCVD risk  factor, treating to a non-HDL-C goal of <100 mg/dL  (LDL-C of <70 mg/dL) is considered a therapeutic  option.   . Glucose, Bld 02/20/2017 85  65 - 99 mg/dL Final   Comment: .            Fasting reference interval .   . BUN 02/20/2017 20  7 - 25 mg/dL Final  . Creat 02/20/2017 1.13* 0.50 - 0.99 mg/dL Final   Comment: For patients >49 years of  age, the reference limit for Creatinine is approximately 13% higher for people identified as African-American. .   Havery Moros Ratio 02/20/2017 18  6 - 22 (calc) Final  . Sodium 02/20/2017 141  135 - 146 mmol/L Final  . Potassium 02/20/2017 4.5  3.5 - 5.3 mmol/L Final  . Chloride 02/20/2017 106  98 - 110 mmol/L Final  . CO2 02/20/2017 27  20 - 32 mmol/L Final  . Calcium 02/20/2017 9.5  8.6 - 10.4 mg/dL Final  . Total Protein 02/20/2017 6.6  6.1 - 8.1 g/dL Final  . Albumin 02/20/2017 4.3  3.6 - 5.1 g/dL Final  . Globulin 02/20/2017 2.3  1.9 - 3.7 g/dL (calc) Final  . AG Ratio 02/20/2017 1.9  1.0 - 2.5 (calc) Final  . Total Bilirubin 02/20/2017 0.9  0.2 - 1.2 mg/dL Final  . Alkaline phosphatase (APISO) 02/20/2017 32* 33 - 130 U/L Final  . AST 02/20/2017 49* 10 - 35 U/L Final  . ALT 02/20/2017 45* 6 - 29 U/L Final  . WBC 02/20/2017 6.4  3.8 - 10.8 Thousand/uL Final  . RBC 02/20/2017 3.89  3.80 - 5.10 Million/uL Final  . Hemoglobin 02/20/2017 12.8  11.7 - 15.5 g/dL Final  . HCT 02/20/2017 37.9  35.0 - 45.0 % Final  . MCV 02/20/2017 97.4  80.0 - 100.0 fL Final  . MCH 02/20/2017 32.9  27.0 - 33.0 pg Final  .  MCHC 02/20/2017 33.8  32.0 - 36.0 g/dL Final  . RDW 02/20/2017 13.0  11.0 - 15.0 % Final  . Platelets 02/20/2017 230  140 - 400 Thousand/uL Final  . MPV 02/20/2017 10.6  7.5 - 12.5 fL Final  . Neutro Abs 02/20/2017 3,142  1,500 - 7,800 cells/uL Final  . Lymphs Abs 02/20/2017 2,234  850 - 3,900 cells/uL Final  . WBC mixed population 02/20/2017 947  200 - 950 cells/uL Final  . Eosinophils Absolute 02/20/2017 38  15 - 500 cells/uL Final  . Basophils Absolute 02/20/2017 38  0 - 200 cells/uL Final  . Neutrophils Relative % 02/20/2017 49.1  % Final  . Total Lymphocyte 02/20/2017 34.9  % Final  . Monocytes Relative 02/20/2017 14.8  % Final  . Eosinophils Relative 02/20/2017 0.6  % Final  . Basophils Relative 02/20/2017 0.6  % Final   Past Medical History:  Diagnosis Date  .  Allergic rhinitis   . Allergy    SEASONAL  . Anemia   . Anxiety    pt denies  . Arthritis    "mostly the hands" (02/20/2012)  . Asthma   . Breast cancer ([place]) 1998   in remission  . Cataract    REMOVED  . Chronic lower back pain   . Chronic neck pain   . Complication of anesthesia    "had hard time waking up from it several times" (02/20/2012)  . Depression    "some; don't take anything for it" (02/20/2012)  . Diverticulosis   . DVT (deep venous thrombosis) ([place])   . Exertional dyspnea   . Fibromyalgia 11/2011  . GERD (gastroesophageal reflux disease)   . Graves disease   . Headache(784.0)    "related to allergies; more at different times during the year" (02/20/2012)  . Hemorrhoids   . Hiatal hernia    back and neck  . Hx of adenomatous colonic polyps 04/12/2016  . Hypercholesteremia    good cholesterol is high  . Hypothyroidism   . IBS (irritable bowel syndrome)   . Moderate persistent asthma    -FeV1 72% 2011, -IgE 102 2011, CT sinus Neg 2011  . Osteoporosis    on reclast yearly  . Pneumonia 04/2011; ~ 11/2011   "double; single" (02/20/2012)   Past Surgical History:  Procedure Laterality Date  . ANTERIOR AND POSTERIOR REPAIR  1990's  . APPENDECTOMY    . BREAST LUMPECTOMY  1998   left  . CARPOMETACARPEL ([place]) FUSION OF THUMB WITH AUTOGRAFT FROM RADIUS  ~ 2009   "both thumbs" (02/20/2012)  . CATARACT EXTRACTION W/ INTRAOCULAR LENS  IMPLANT, BILATERAL  2012  . CERVICAL DISCECTOMY  10/2001   C5-C6  . CERVICAL FUSION  2003   C3-C4  . CHOLECYSTECTOMY    . COLONOSCOPY    . DEBRIDEMENT TENNIS ELBOW  ?1970's   right  . ESOPHAGOGASTRODUODENOSCOPY    . HYSTERECTOMY    . KNEE ARTHROPLASTY  ?1990's   "?right; w/cartilage repair" (02/20/2012)  . NASAL SEPTUM SURGERY  1980's  . POSTERIOR CERVICAL FUSION/FORAMINOTOMY  2004   "failed initial fusion; rewired  anterior neck" (02/20/2012)  . TONSILLECTOMY  ~ 1953  . VESICOVAGINAL FISTULA CLOSURE W/ TAH  1988  . VIDEO BRONCHOSCOPY  Bilateral 08/23/2016   Procedure: VIDEO BRONCHOSCOPY WITH FLUORO;  Surgeon: Javier Glazier, MD;  Location: [facility];  Service: Cardiopulmonary;  Laterality: Bilateral;    Current Outpatient Medications on File Prior to Visit  Medication Sig Dispense Refill  . acetaminophen (TYLENOL) 650  MG CR tablet Take 1,300 mg by mouth 2 (two) times daily.    Marland Kitchen albuterol (PROAIR HFA) 108 (90 Base) MCG/ACT inhaler Inhale 2 puffs into the lungs every 4 (four) hours as needed for wheezing or shortness of breath. 18 g 5  . Alpha-D-Galactosidase (BEANO PO) Take 1 capsule by mouth 3 (three) times daily with meals.    . Alpha-Lipoic Acid 600 MG CAPS Take 600 mg by mouth daily.     Marland Kitchen alum & mag hydroxide-simeth (GELUSIL) 200-200-25 MG chewable tablet Chew 1 tablet by mouth every 6 (six) hours as needed for flatulence.    Marland Kitchen azelastine (ASTELIN) 0.1 % nasal spray USE 2 SPRAYS IN EACH NOSTRIL DAILY AS DIRECTED 30 mL 5  . cetirizine (ZYRTEC) 10 MG tablet Take 5 mg by mouth every evening.     . Cholecalciferol (VITAMIN D3) 10000 units TABS Take 1,000 Units by mouth daily.    . Colchicine 0.6 MG CAPS TAKE 1 CAPSULE BY MOUTH DAILY AS NEEDED 30 capsule 2  . CREON 24000-76000 units CPEP TAKE 1 CAPSULE BY MOUTH THREE TIMES DAILY (WITH MEALS) 270 each 2  . cyproheptadine (PERIACTIN) 4 MG tablet Take two tablets every evening 60 tablet 0  . denosumab (PROLIA) 60 MG/ML SOLN injection Inject 60 mg into the skin every 6 (six) months. Administer in upper arm, thigh, or abdomen    . dexlansoprazole (DEXILANT) 60 MG capsule Take 1 capsule (60 mg total) by mouth daily. 30 capsule 2  . EPINEPHrine (EPIPEN 2-PAK IJ) Inject as directed.    . gabapentin (NEURONTIN) 600 MG tablet TAKE 1 TABLET BY MOUTH DAILY 90 tablet 3  . guaifenesin (HUMIBID E) 400 MG TABS tablet Take 400 mg by mouth 2 (two) times daily.    . Lactase (LACTAID FAST ACT) 9000 units TABS Take 18,000 Units by mouth 4 (four) times daily as needed (DAIRY).    Marland Kitchen  lidocaine (LIDODERM) 5 % Place 1 patch onto the skin daily. Remove & Discard patch within 12 hours or as directed by MD 90 patch 3  . meclizine (ANTIVERT) 25 MG tablet Take 25 mg by mouth every 6 (six) hours as needed for dizziness.    . Mepolizumab (NUCALA) 100 MG SOLR Inject 100 mg into the skin every 28 (twenty-eight) days.     . methimazole (TAPAZOLE) 5 MG tablet Take 1 tablet (5 mg total) by mouth 3 (three) times daily. 270 tablet 1  . mirtazapine (REMERON SOL-TAB) 30 MG disintegrating tablet DISOLVE 1 TABLET BY MOUTH EACH NIGHT AT BEDTIME 30 tablet 5  . mometasone (NASONEX) 50 MCG/ACT nasal spray Place 2 sprays into the nose 2 (two) times daily. 17 g 0  . mometasone-formoterol (DULERA) 200-5 MCG/ACT AERO Inhale 2 puffs into the lungs 2 (two) times daily. 1 Inhaler 3  . montelukast (SINGULAIR) 10 MG tablet Take 1 tablet (10 mg total) by mouth daily. 30 tablet 5  . Multiple Minerals-Vitamins (CALCIUM CITRATE PLUS/MAGNESIUM PO) Take 1 tablet by mouth 2 (two) times daily.    . mupirocin ointment (BACTROBAN) 2 % Place 1 application into the nose 3 (three) times daily as needed (DRY NOSE). 22 g 1  . nystatin (MYCOSTATIN) 100000 UNIT/ML suspension USE AS DIRECTED 5MLS IN MOUTH OR THROAT 4 TIMES DAILY 473 mL 2  . nystatin-triamcinolone ointment (MYCOLOG) Apply 1 application topically 3 (three) times daily as needed (YEAST INFECTION).     Marland Kitchen pravastatin (PRAVACHOL) 20 MG tablet TAKE 1 TABLET BY MOUTH DAILY 90 tablet 3  .  Prenatal Vit-Sel-Fe Fum-FA (VINATE M) 27-1 MG TABS Take 1 tablet by mouth daily. 30 each 11  . PRESCRIPTION MEDICATION Place 2 puffs into both ears daily as needed. Pt has compounded capsule (powder) that she uses In her ears as needed; CS (X) AHCL    CHLORAMP/SULFU/AMPHO B/ H DROC 100-100-5 mg capsule    . Probiotic Product (ALIGN) 4 MG CAPS Take 1 tablet by mouth daily.     Marland Kitchen PROCTO-MED HC 2.5 % rectal cream APPLY AS NEEDED AS DIRECTED 30 g 5  . Propylene Glycol-Glycerin (SOOTHE OP)  Place 1 drop into both eyes 2 (two) times daily.     . ranitidine (ZANTAC) 300 MG tablet Take 1 tablet (300 mg total) by mouth daily. 30 tablet 5  . Respiratory Therapy Supplies (FLUTTER) DEVI Use as directed 1 each 0  . SIMPLY SALINE NA Place 1 spray into both nostrils at bedtime.    Marland Kitchen Spacer/Aero-Holding Chambers (AEROCHAMBER MV) inhaler Use as instructed 1 each 0  . Tiotropium Bromide Monohydrate (SPIRIVA RESPIMAT) 2.5 MCG/ACT AERS Inhale 2 puffs into the lungs daily.     Current Facility-Administered Medications on File Prior to Visit  Medication Dose Route Frequency Provider Last Rate Last Dose  . Mepolizumab SOLR 100 mg  100 mg Subcutaneous Q28 days Javier Glazier, MD   100 mg at 01/07/17 0911  . Mepolizumab SOLR 100 mg  100 mg Subcutaneous Q28 days Parrett, Tammy S, NP   100 mg at 02/01/17 1351   Allergies  Allergen Reactions  . Dust Mite Extract Shortness Of Breath and Other (See Comments)    "sneezing" (02/20/2012)  . Molds & Smuts Shortness Of Breath  . Morphine Sulfate Itching  . Other Shortness Of Breath and Other (See Comments)    Grass and weeds "sneezing; filled sinuses" (02/20/2012)  . Penicillins Rash and Other (See Comments)    "welts" (02/20/2012) Has patient had a PCN reaction causing immediate rash, facial/tongue/throat swelling, SOB or lightheadedness with hypotension: Unknown Has patient had a PCN reaction causing severe rash involving mucus membranes or skin necrosis: No Has patient had a PCN reaction that required hospitalization: No Has patient had a PCN reaction occurring within the last 10 years: No If all of the above answers are "NO", then may proceed with Cephalosporin use.   Marland Kitchen Reclast [Zoledronic Acid] Other (See Comments)    Fever, Put in hospital, dr said it was a reaction from a reaction   . Rofecoxib Swelling    Celebrex/vioxx REACTION: feet swelling  . Shrimp Flavor Anaphylaxis    ALL SHELLFISH  . Tetracycline Hcl Nausea And Vomiting  . Xolair  [Omalizumab] Other (See Comments)    Caused Blood clot  . Dilaudid [Hydromorphone Hcl] Itching  . Hydrocodone-Acetaminophen Nausea And Vomiting  . Levofloxacin Other (See Comments)    REACTION: GI upset  . Oxycodone Hcl Nausea And Vomiting  . Paroxetine Nausea And Vomiting    Paxil   . Diltiazem Swelling  . Tree Extract Other (See Comments)    "tested and told I was allergic to it; never experienced a reaction to it" (02/20/2012)   Social History   Socioeconomic History  . Marital status: Divorced    Spouse name: Not on file  . Number of children: 2  . Years of education: college  . Highest education level: Not on file  Social Needs  . Financial resource strain: Not on file  . Food insecurity - worry: Not on file  . Food insecurity -  inability: Not on file  . Transportation needs - medical: Not on file  . Transportation needs - non-medical: Not on file  Occupational History  . Occupation: Disabled    Comment: Retired Engineer, production: RETIRED  Tobacco Use  . Smoking status: Passive Smoke Exposure - Never Smoker  . Smokeless tobacco: Never Used  . Tobacco comment: Parents  Substance and Sexual Activity  . Alcohol use: Yes    Alcohol/week: 0.0 oz    Comment: 02/20/2012 "couple glasses of wine/6 months"  . Drug use: No  . Sexual activity: No  Other Topics Concern  . Not on file  Social History Narrative   Patient lives at home alone. Patient  divorced.    Patient has her BS degree.   Right handed.   Caffeine- sometimes coffee.      [facility]:   Born in [place], [place]. She worked as a Copywriter, advertising. She has no pets currently. She does have indoor plants. Previously had mold in her home that was remediated. Carpet was removed.             Review of Systems  All other systems reviewed and are negative.      Objective:   Physical Exam  Constitutional: She appears well-developed and well-nourished. No distress.  HENT:  Nose:  Nose normal.  Mouth/Throat: Oropharynx is clear and moist. No oropharyngeal exudate.  Neck: Neck supple. No thyromegaly present.  Cardiovascular: Normal rate, regular rhythm and normal heart sounds.  Pulmonary/Chest: Effort normal. No respiratory distress. She has no wheezes. She has no rales.  Abdominal: Soft. Bowel sounds are normal.  Musculoskeletal: She exhibits no edema.  Skin: She is not diaphoretic.  Vitals reviewed.         Assessment & Plan:  Hyperthyroidism - Plan: TSH  Pure hypercholesterolemia  Check TSH. May need to discontinue methimazole if TSH is elevated suggesting over suppression. Over suppression would certainly explain the weight gain, fatigue, and maybe even contribute to some of her memory problems. LDL cholesterol is poorly controlled. Discontinue pravastatin and replace with Lipitor 20 mg a day and recheck liver function test and fasting lipid panel in 3 months

## 2017-02-27 ENCOUNTER — Encounter: Payer: Self-pay | Admitting: Family Medicine

## 2017-03-01 ENCOUNTER — Ambulatory Visit: Payer: Medicare Other

## 2017-03-06 ENCOUNTER — Ambulatory Visit (INDEPENDENT_AMBULATORY_CARE_PROVIDER_SITE_OTHER): Payer: Medicare Other

## 2017-03-06 ENCOUNTER — Encounter: Payer: Self-pay | Admitting: Allergy and Immunology

## 2017-03-06 ENCOUNTER — Telehealth: Payer: Self-pay | Admitting: Pulmonary Disease

## 2017-03-06 DIAGNOSIS — J4550 Severe persistent asthma, uncomplicated: Secondary | ICD-10-CM | POA: Diagnosis not present

## 2017-03-06 NOTE — Telephone Encounter (Signed)
LMTCB

## 2017-03-07 NOTE — Telephone Encounter (Signed)
I have located the pt's patient assistance forms. The Nasonex prescription has been updated. Pt is aware of this. Nothing further was needed.

## 2017-03-13 MED ORDER — MEPOLIZUMAB 100 MG ~~LOC~~ SOLR
100.0000 mg | SUBCUTANEOUS | Status: DC
  Administered 2017-03-06: 100 mg via SUBCUTANEOUS

## 2017-03-13 NOTE — Progress Notes (Signed)
Documentation of medication administration and charges of Nucala have been completed by Lindsay Lemons, CMA based on the Nucala documentation sheet completed by Tammy Scott.   

## 2017-03-29 ENCOUNTER — Telehealth: Payer: Self-pay | Admitting: Pulmonary Disease

## 2017-03-29 NOTE — Telephone Encounter (Signed)
Vials: 1 Order Date: 03/29/17 Ship Date: 03/31/17 This is not a typo, I checked with the rep. To make sure. Med. Is being shipped Sun. For Mon. Del.Marland Kitchen

## 2017-03-29 NOTE — Telephone Encounter (Signed)
Called pharm back, pt's nucala is coming in 04/01/17. I will put in the order in the comp.. Nothing further needed.

## 2017-04-01 ENCOUNTER — Other Ambulatory Visit: Payer: Self-pay

## 2017-04-01 MED ORDER — MUPIROCIN 2 % EX OINT: 1.0000 "application " | TOPICAL_OINTMENT | Freq: Three times a day (TID) | CUTANEOUS | 1 refills | Status: DC | PRN

## 2017-04-01 MED ORDER — CYPROHEPTADINE HCL 4 MG PO TABS: ORAL_TABLET | ORAL | 0 refills | Status: DC

## 2017-04-01 NOTE — Telephone Encounter (Signed)
Arrival Date: 04/01/17 # of Vials: 3 Lot #: [id number] Expiration Date: 04/2019

## 2017-04-02 ENCOUNTER — Telehealth: Payer: Self-pay | Admitting: Pulmonary Disease

## 2017-04-02 NOTE — Telephone Encounter (Signed)
Called pt. To let her know her nucala did come in. I answered her questions, pt is coming in tomorrow for her shot. Nothing further needed. Closing encounter.

## 2017-04-02 NOTE — Telephone Encounter (Signed)
TS please advise.  

## 2017-04-03 ENCOUNTER — Encounter: Payer: Self-pay | Admitting: Family Medicine

## 2017-04-03 ENCOUNTER — Ambulatory Visit (INDEPENDENT_AMBULATORY_CARE_PROVIDER_SITE_OTHER): Payer: Medicare Other

## 2017-04-03 DIAGNOSIS — J4550 Severe persistent asthma, uncomplicated: Secondary | ICD-10-CM

## 2017-04-04 MED ORDER — MEPOLIZUMAB 100 MG ~~LOC~~ SOLR
100.0000 mg | SUBCUTANEOUS | Status: DC
  Administered 2017-04-03: 100 mg via SUBCUTANEOUS

## 2017-04-04 NOTE — Progress Notes (Signed)
Documentation of medication administration and charges of Nucala have been completed by Lindsay Lemons, CMA based on the Nucala documentation sheet completed by Tammy Scott.   

## 2017-04-29 ENCOUNTER — Other Ambulatory Visit: Payer: Self-pay

## 2017-04-29 ENCOUNTER — Other Ambulatory Visit: Payer: Self-pay | Admitting: Family Medicine

## 2017-04-29 MED ORDER — CYPROHEPTADINE HCL 4 MG PO TABS: ORAL_TABLET | ORAL | 1 refills | Status: DC

## 2017-04-30 ENCOUNTER — Telehealth: Payer: Self-pay

## 2017-04-30 NOTE — Telephone Encounter (Signed)
Patient called regarding a rash she has that started on her right cheek a couple of days ago that has spread to her chin and around her neck. Patient states the center of the rash has a white spot in the middle. Patient initially stated she had gotten bit by an insect on her cheek, then  the patient states she was recently started on atorvastatin as well as methimazole in which she has now been on for about a month if not longer and feels the methimazole could be the cause of the rash.  Patient denies SOB but is having itching patient states she has not started using any new detergents, lotions or soaps.  Patient has tried otc cream for the rash but it is not working. Patient was instructed to stay on medication since she has been on it for over a month and the rash just started. Appointment has been scheduled for 05/01/2017 with Olean Ree.

## 2017-05-01 ENCOUNTER — Encounter: Payer: Self-pay | Admitting: Physician Assistant

## 2017-05-01 ENCOUNTER — Ambulatory Visit: Payer: Medicare Other

## 2017-05-01 ENCOUNTER — Ambulatory Visit: Payer: Medicare Other | Admitting: Physician Assistant

## 2017-05-01 VITALS — BP 130/80 | HR 71 | Temp 98.0°F | Resp 16 | Wt 135.0 lb

## 2017-05-01 DIAGNOSIS — L239 Allergic contact dermatitis, unspecified cause: Secondary | ICD-10-CM | POA: Diagnosis not present

## 2017-05-01 MED ORDER — PREDNISONE 20 MG PO TABS: ORAL_TABLET | ORAL | 0 refills | Status: DC

## 2017-05-01 NOTE — Progress Notes (Signed)
Patient ID: DAMIRA KEM MRN: [id number], DOB: 1950/03/31, 67 y.o. Date of Encounter: 05/01/2017, 12:10 PM    Chief Complaint:  Chief Complaint  Patient presents with  . Rash    on face and neck started 1 week ago      HPI: 67 y.o. year old female presents with above.  Reports that this started about 1 week ago.  Reports that at that time she noticed one little spot on her right cheek and at that time thought maybe it was a little bug bite. States that about 3-4 days ago started to get similar spots on her chin.  Then started to develop spots on her left cheek and then on her neck. Reports that there are no other areas of her skin affected.  Reports that the areas are itchy.  Reports that she has tried applying cortisone cream.  At another time tried some antibiotic ointment.  At another time put some alcohol on a swab and swabbed/wiped areas with alcohol.  Says that [employer] came to her house recently so she had them spray around her bed etc. in case there was any type of insects there causing this. She has no type of pets in her house.  We discussed the fact that she has asthma and multiple allergies. Reports that she has eaten no new or different foods.  Has used no new or different lotions soaps or other skin care products.       Home Meds:   Outpatient Medications Prior to Visit  Medication Sig Dispense Refill  . acetaminophen (TYLENOL) 650 MG CR tablet Take 1,300 mg by mouth 2 (two) times daily.    Marland Kitchen albuterol (PROAIR HFA) 108 (90 Base) MCG/ACT inhaler Inhale 2 puffs into the lungs every 4 (four) hours as needed for wheezing or shortness of breath. 18 g 5  . Alpha-D-Galactosidase (BEANO PO) Take 1 capsule by mouth 3 (three) times daily with meals.    . Alpha-Lipoic Acid 600 MG CAPS Take 600 mg by mouth daily.     Marland Kitchen alum & mag hydroxide-simeth (GELUSIL) 200-200-25 MG chewable tablet Chew 1 tablet by mouth every 6 (six) hours as needed for flatulence.    Marland Kitchen atorvastatin  (LIPITOR) 20 MG tablet Take 1 tablet (20 mg total) by mouth daily. 90 tablet 3  . azelastine (ASTELIN) 0.1 % nasal spray USE 2 SPRAYS IN EACH NOSTRIL DAILY AS DIRECTED 30 mL 5  . cetirizine (ZYRTEC) 10 MG tablet Take 5 mg by mouth every evening.     . Cholecalciferol (VITAMIN D3) 10000 units TABS Take 1,000 Units by mouth daily.    . Colchicine 0.6 MG CAPS TAKE 1 CAPSULE BY MOUTH DAILY AS NEEDED 30 capsule 2  . CREON 24000-76000 units CPEP TAKE 1 CAPSULE BY MOUTH THREE TIMES DAILY (WITH MEALS) 270 each 2  . cyproheptadine (PERIACTIN) 4 MG tablet Take two tablets every evening 60 tablet 1  . denosumab (PROLIA) 60 MG/ML SOLN injection Inject 60 mg into the skin every 6 (six) months. Administer in upper arm, thigh, or abdomen    . dexlansoprazole (DEXILANT) 60 MG capsule Take 1 capsule (60 mg total) by mouth daily. 30 capsule 2  . diclofenac (VOLTAREN) 75 MG EC tablet Take 1 tablet (75 mg total) by mouth 2 (two) times daily. 30 tablet 0  . EPINEPHrine (EPIPEN 2-PAK IJ) Inject as directed.    . gabapentin (NEURONTIN) 600 MG tablet TAKE 1 TABLET BY MOUTH DAILY 90 tablet 3  .  guaifenesin (HUMIBID E) 400 MG TABS tablet Take 400 mg by mouth 2 (two) times daily.    . Lactase (LACTAID FAST ACT) 9000 units TABS Take 18,000 Units by mouth 4 (four) times daily as needed (DAIRY).    Marland Kitchen lidocaine (LIDODERM) 5 % Place 1 patch onto the skin daily. Remove & Discard patch within 12 hours or as directed by MD 90 patch 3  . meclizine (ANTIVERT) 25 MG tablet Take 25 mg by mouth every 6 (six) hours as needed for dizziness.    . Mepolizumab (NUCALA) 100 MG SOLR Inject 100 mg into the skin every 28 (twenty-eight) days.     . methimazole (TAPAZOLE) 5 MG tablet Take 1 tablet (5 mg total) by mouth 3 (three) times daily. 270 tablet 1  . mirtazapine (REMERON SOL-TAB) 30 MG disintegrating tablet DISOLVE 1 TABLET BY MOUTH EACH NIGHT AT BEDTIME 30 tablet 5  . mometasone (NASONEX) 50 MCG/ACT nasal spray Place 2 sprays into the nose  2 (two) times daily. 17 g 0  . mometasone-formoterol (DULERA) 200-5 MCG/ACT AERO Inhale 2 puffs into the lungs 2 (two) times daily. 1 Inhaler 3  . montelukast (SINGULAIR) 10 MG tablet Take 1 tablet (10 mg total) by mouth daily. 30 tablet 5  . Multiple Minerals-Vitamins (CALCIUM CITRATE PLUS/MAGNESIUM PO) Take 1 tablet by mouth 2 (two) times daily.    Marland Kitchen nystatin (MYCOSTATIN) 100000 UNIT/ML suspension USE AS DIRECTED 5MLS IN MOUTH OR THROAT 4 TIMES DAILY 473 mL 2  . nystatin-triamcinolone ointment (MYCOLOG) Apply 1 application topically 3 (three) times daily as needed (YEAST INFECTION).     . Prenatal Vit-Fe Fumarate-FA (PREPLUS) 27-1 MG TABS TAKE 1 TABLET BY MOUTH DAILY 30 tablet 3  . PRESCRIPTION MEDICATION Place 2 puffs into both ears daily as needed. Pt has compounded capsule (powder) that she uses In her ears as needed; CS (X) AHCL    CHLORAMP/SULFU/AMPHO B/ H DROC 100-100-5 mg capsule    . Probiotic Product (ALIGN) 4 MG CAPS Take 1 tablet by mouth daily.     Marland Kitchen PROCTO-MED HC 2.5 % rectal cream APPLY AS NEEDED AS DIRECTED 30 g 5  . Propylene Glycol-Glycerin (SOOTHE OP) Place 1 drop into both eyes 2 (two) times daily.     . ranitidine (ZANTAC) 300 MG tablet Take 1 tablet (300 mg total) by mouth daily. 30 tablet 5  . Respiratory Therapy Supplies (FLUTTER) DEVI Use as directed 1 each 0  . SIMPLY SALINE NA Place 1 spray into both nostrils at bedtime.    Marland Kitchen Spacer/Aero-Holding Chambers (AEROCHAMBER MV) inhaler Use as instructed 1 each 0  . Tiotropium Bromide Monohydrate (SPIRIVA RESPIMAT) 2.5 MCG/ACT AERS Inhale 2 puffs into the lungs daily.    . diazepam (VALIUM) 5 MG tablet Take 1 tablet (5 mg total) by mouth at bedtime as needed (TMJ pain). (Patient not taking: Reported on 05/01/2017) 20 tablet 0  . mupirocin ointment (BACTROBAN) 2 % Place 1 application into the nose 3 (three) times daily as needed (DRY NOSE). (Patient not taking: Reported on 05/01/2017) 22 g 1   Facility-Administered Medications  Prior to Visit  Medication Dose Route Frequency Provider Last Rate Last Dose  . Mepolizumab SOLR 100 mg  100 mg Subcutaneous Q28 days Javier Glazier, MD   100 mg at 01/07/17 0911  . Mepolizumab SOLR 100 mg  100 mg Subcutaneous Q28 days Parrett, Tammy S, NP   100 mg at 02/01/17 1351  . Mepolizumab SOLR 100 mg  100 mg Subcutaneous Q28  days Juanito Doom, MD   100 mg at 03/06/17 1158  . Mepolizumab SOLR 100 mg  100 mg Subcutaneous Q28 days Simonne Maffucci B, MD   100 mg at 04/03/17 1122    Allergies:  Allergies  Allergen Reactions  . Dust Mite Extract Shortness Of Breath and Other (See Comments)    "sneezing" (02/20/2012)  . Molds & Smuts Shortness Of Breath  . Morphine Sulfate Itching  . Other Shortness Of Breath and Other (See Comments)    Grass and weeds "sneezing; filled sinuses" (02/20/2012)  . Penicillins Rash and Other (See Comments)    "welts" (02/20/2012) Has patient had a PCN reaction causing immediate rash, facial/tongue/throat swelling, SOB or lightheadedness with hypotension: Unknown Has patient had a PCN reaction causing severe rash involving mucus membranes or skin necrosis: No Has patient had a PCN reaction that required hospitalization: No Has patient had a PCN reaction occurring within the last 10 years: No If all of the above answers are "NO", then may proceed with Cephalosporin use.   Marland Kitchen Reclast [Zoledronic Acid] Other (See Comments)    Fever, Put in hospital, dr said it was a reaction from a reaction   . Rofecoxib Swelling    Celebrex/vioxx REACTION: feet swelling  . Shrimp Flavor Anaphylaxis    ALL SHELLFISH  . Tetracycline Hcl Nausea And Vomiting  . Xolair [Omalizumab] Other (See Comments)    Caused Blood clot  . Dilaudid [Hydromorphone Hcl] Itching  . Hydrocodone-Acetaminophen Nausea And Vomiting  . Levofloxacin Other (See Comments)    REACTION: GI upset  . Oxycodone Hcl Nausea And Vomiting  . Paroxetine Nausea And Vomiting    Paxil   . Diltiazem  Swelling  . Tree Extract Other (See Comments)    "tested and told I was allergic to it; never experienced a reaction to it" (02/20/2012)      Review of Systems: See HPI for pertinent ROS. All other ROS negative.    Physical Exam: Blood pressure 130/80, pulse 71, temperature 98 F (36.7 C), temperature source Oral, resp. rate 16, weight 61.2 kg (135 lb), SpO2 97 %., Body mass index is 24.69 kg/m. General:  WNWD WF. Appears in no acute distress. Neck: Supple. No thyromegaly. No lymphadenopathy. Lungs: Clear bilaterally to auscultation without wheezes, rales, or rhonchi. Breathing is unlabored. Heart: Regular rhythm. No murmurs, rubs, or gallops. Msk:  Strength and tone normal for age. Skin: She has some areas ( ~ 3 areas--each < 1cm diameter)) of pink erythema that are "splotchy" in appearance on her right cheek.  Similar areas on her left cheek ( ~ 2 -3 splotchy areas on left cheek).  Has a larger splotchy area on her left chin ( ~ 1 cm diameter).  Her left upper neck has multiple tiny pink papules ( each ~ 57mm) Neuro: Alert and oriented X 3. Moves all extremities spontaneously. Gait is normal. CNII-XII grossly in tact. Psych:  Responds to questions appropriately with a normal affect.     ASSESSMENT AND PLAN:  67 y.o. year old female with  1. Allergic dermatitis Reviewed that she is already on Zantac and also is on Zyrtec and Singulair. At this point will add prednisone taper.  Take the prednisone taper as directed.  Follow-up if rash worsens or does not resolve upon completion of this. - predniSONE (DELTASONE) 20 MG tablet; Take 3 daily for 2 days, then 2 daily for 2 days, then 1 daily for 2 days.  Dispense: 12 tablet; Refill: 0   Signed,  [address] [place], [place], [place] 05/01/2017 12:10 PM

## 2017-05-03 ENCOUNTER — Telehealth: Payer: Self-pay | Admitting: Pulmonary Disease

## 2017-05-03 MED ORDER — TIOTROPIUM BROMIDE MONOHYDRATE 2.5 MCG/ACT IN AERS: 2.0000 | INHALATION_SPRAY | Freq: Every day | RESPIRATORY_TRACT | 2 refills | Status: DC

## 2017-05-03 NOTE — Telephone Encounter (Signed)
Spoke with pt. She is needing a refill on Spiriva Respimat. Rx has been sent in. Nothing further was needed.

## 2017-05-06 NOTE — Telephone Encounter (Signed)
Patient aware nothing further needed. 

## 2017-05-06 NOTE — Telephone Encounter (Signed)
Needs OV for someone to look at the rash here to assess whether or not it could be related to the Nucala. Hold nucala until then

## 2017-05-06 NOTE — Telephone Encounter (Signed)
Take the shot on 3/20, see me on 3/28.  Hopefully no rash on that visit.

## 2017-05-06 NOTE — Telephone Encounter (Signed)
Called and spoke with patient, she states that her rash was on the neck and face. She has already had 4 injections and this will be her 5th. She is wanting to know if she needs to cancel the appointment for the injection or if it is ok to get it. Her PCP placed her on a medication and states that it is only dermatitis. BQ please advise, thanks.

## 2017-05-06 NOTE — Telephone Encounter (Signed)
Spoke with the pt and advised of Dr Anastasia Pall recs  She states that the rash is pretty much gone "just a couple of spots left" She feels sure it would be completely gone by tomorrow which is when we could see her  She has been on prednisone for it   She is scheduled for Nucala for 3/20 and consul with you on 05/16/17 Do you want her to just hold the nucala inj until she sees you and can discuss at her 3/28 visit?

## 2017-05-08 ENCOUNTER — Ambulatory Visit (INDEPENDENT_AMBULATORY_CARE_PROVIDER_SITE_OTHER): Payer: Medicare Other

## 2017-05-08 DIAGNOSIS — J4550 Severe persistent asthma, uncomplicated: Secondary | ICD-10-CM | POA: Diagnosis not present

## 2017-05-09 DIAGNOSIS — J4550 Severe persistent asthma, uncomplicated: Secondary | ICD-10-CM | POA: Diagnosis not present

## 2017-05-09 MED ORDER — MEPOLIZUMAB 100 MG ~~LOC~~ SOLR
100.0000 mg | SUBCUTANEOUS | Status: DC
  Administered 2017-05-09: 100 mg via SUBCUTANEOUS

## 2017-05-09 NOTE — Progress Notes (Signed)
Nucala injection documentation and charges entered by Maury Dus, RMA, based on injection sheet filled out by Alroy Bailiff during preparation and administration. This documentation process is due to office requirements.

## 2017-05-15 ENCOUNTER — Ambulatory Visit: Payer: Medicare Other | Admitting: Pulmonary Disease

## 2017-05-15 ENCOUNTER — Ambulatory Visit (INDEPENDENT_AMBULATORY_CARE_PROVIDER_SITE_OTHER)
Admission: RE | Admit: 2017-05-15 | Discharge: 2017-05-15 | Disposition: A | Discharge status: Home / Self Care | Payer: Medicare Other | Source: Ambulatory Visit | Attending: Pulmonary Disease | Admitting: Pulmonary Disease

## 2017-05-15 ENCOUNTER — Encounter: Payer: Self-pay | Admitting: Pulmonary Disease

## 2017-05-15 VITALS — BP 126/72 | HR 78 | Ht 61.5 in | Wt 134.0 lb

## 2017-05-15 DIAGNOSIS — R0600 Dyspnea, unspecified: Secondary | ICD-10-CM

## 2017-05-15 DIAGNOSIS — J4550 Severe persistent asthma, uncomplicated: Secondary | ICD-10-CM

## 2017-05-15 LAB — NITRIC OXIDE: Nitric Oxide: 15

## 2017-05-15 NOTE — Progress Notes (Signed)
Subjective:   PATIENT ID: Ebony Scott GENDER: female DOB: 06-Mar-1950, MRN: [id number]  Synopsis: Former patient of Dr. Ashok Cordia who has severe persistent asthma and chronic rhinitis.  She also has Dr  HPI  Chief Complaint  Patient presents with  . Follow-up    former [facility] pt being treated for severe asthma.      [name] was followed by my partner for years for severe persistent asthma.  She currently takes Spiriva, Symbicort, and injections with a biologic agent called Nucala.  She tells me that she has been keeping her peak flow readings at home and her peak flow has not been lower than 250 at home.  She says that she had trouble breathing in November and saw Dr. Carmelina Peal around that time and had some prednisone.  In February of last year she had influenza and was hospitalized for that.  She notes that she has diastolic heart failure.  She sees Dr. Aundra Dubin for this.    She says that in the last 2.5 years she has been struggling with DYSPNEA.  She notes exertional dyspnea when climbing stairs.  She has stopped exercising because of this and has gained weight.  She says that any walking she does is in the house.  She tries to stay busy in the house but she is not exercising routinely.  She says that she took some prednisone recently for some dermatitis.  She says that shen she took that she had more energy ans she was breathing better.    She says that she started [place] in November, perhaps her breahting is slighlty better. She says this is difficult to assess though because when she took prendisone she felt better.    She has been twisting and sucking on Spiriva BUT NEVER PRESSING THE BUTTON TO RELEASE THE MEDICINE.  Past Medical History:  Diagnosis Date  . Allergic rhinitis   . Allergy    SEASONAL  . Anemia   . Anxiety    pt denies  . Arthritis    "mostly the hands" (02/20/2012)  . Asthma   . Breast cancer ([place]) 1998   in remission  . Cataract    REMOVED  . Chronic lower back  pain   . Chronic neck pain   . Complication of anesthesia    "had hard time waking up from it several times" (02/20/2012)  . Depression    "some; don't take anything for it" (02/20/2012)  . Diverticulosis   . DVT (deep venous thrombosis) ([place])   . Exertional dyspnea   . Fibromyalgia 11/2011  . GERD (gastroesophageal reflux disease)   . Graves disease   . Headache(784.0)    "related to allergies; more at different times during the year" (02/20/2012)  . Hemorrhoids   . Hiatal hernia    back and neck  . Hx of adenomatous colonic polyps 04/12/2016  . Hypercholesteremia    good cholesterol is high  . Hypothyroidism   . IBS (irritable bowel syndrome)   . Moderate persistent asthma    -FeV1 72% 2011, -IgE 102 2011, CT sinus Neg 2011  . Osteoporosis    on reclast yearly  . Pneumonia 04/2011; ~ 11/2011   "double; single" (02/20/2012)     Family History  Problem Relation Age of Onset  . Allergies Mother   . Heart disease Mother   . Arthritis Mother   . Lung cancer Mother   . Diabetes Mother   . Allergies Father   . Heart  disease Father   . Arthritis Father   . Stroke Father   . Colon cancer Unknown        Maternal half aunt/Maternal half uncle  . Colitis Daughter   . Diabetes Maternal Grandfather      Social History   Socioeconomic History  . Marital status: Divorced    Spouse name: Not on file  . Number of children: 2  . Years of education: college  . Highest education level: Not on file  Occupational History  . Occupation: Disabled    Comment: Retired Engineer, production: RETIRED  Social Needs  . Financial resource strain: Not on file  . Food insecurity:    Worry: Not on file    Inability: Not on file  . Transportation needs:    Medical: Not on file    Non-medical: Not on file  Tobacco Use  . Smoking status: Passive Smoke Exposure - Never Smoker  . Smokeless tobacco: Never Used  . Tobacco comment: Parents  Substance and Sexual Activity  .  Alcohol use: Yes    Alcohol/week: 0.0 oz    Comment: 02/20/2012 "couple glasses of wine/6 months"  . Drug use: No  . Sexual activity: Never  Lifestyle  . Physical activity:    Days per week: Not on file    Minutes per session: Not on file  . Stress: Not on file  Relationships  . Social connections:    Talks on phone: Not on file    Gets together: Not on file    Attends religious service: Not on file    Active member of club or organization: Not on file    Attends meetings of clubs or organizations: Not on file    Relationship status: Not on file  . Intimate partner violence:    Fear of current or ex partner: Not on file    Emotionally abused: Not on file    Physically abused: Not on file    Forced sexual activity: Not on file  Other Topics Concern  . Not on file  Social History Narrative   Patient lives at home alone. Patient  divorced.    Patient has her BS degree.   Right handed.   Caffeine- sometimes coffee.      [facility]:   Born in [place], [place]. She worked as a Copywriter, advertising. She has no pets currently. She does have indoor plants. Previously had mold in her home that was remediated. Carpet was removed.            Allergies  Allergen Reactions  . Dust Mite Extract Shortness Of Breath and Other (See Comments)    "sneezing" (02/20/2012)  . Molds & Smuts Shortness Of Breath  . Morphine Sulfate Itching  . Other Shortness Of Breath and Other (See Comments)    Grass and weeds "sneezing; filled sinuses" (02/20/2012)  . Penicillins Rash and Other (See Comments)    "welts" (02/20/2012) Has patient had a PCN reaction causing immediate rash, facial/tongue/throat swelling, SOB or lightheadedness with hypotension: Unknown Has patient had a PCN reaction causing severe rash involving mucus membranes or skin necrosis: No Has patient had a PCN reaction that required hospitalization: No Has patient had a PCN reaction occurring within the last 10 years: No If all of the above  answers are "NO", then may proceed with Cephalosporin use.   Marland Kitchen Reclast [Zoledronic Acid] Other (See Comments)    Fever, Put in hospital, dr said it was a  reaction from a reaction   . Rofecoxib Swelling    Celebrex/vioxx REACTION: feet swelling  . Shrimp Flavor Anaphylaxis    ALL SHELLFISH  . Tetracycline Hcl Nausea And Vomiting  . Xolair [Omalizumab] Other (See Comments)    Caused Blood clot  . Dilaudid [Hydromorphone Hcl] Itching  . Hydrocodone-Acetaminophen Nausea And Vomiting  . Levofloxacin Other (See Comments)    REACTION: GI upset  . Oxycodone Hcl Nausea And Vomiting  . Paroxetine Nausea And Vomiting    Paxil   . Diltiazem Swelling  . Tree Extract Other (See Comments)    "tested and told I was allergic to it; never experienced a reaction to it" (02/20/2012)     Outpatient Medications Prior to Visit  Medication Sig Dispense Refill  . acetaminophen (TYLENOL) 650 MG CR tablet Take 1,300 mg by mouth 2 (two) times daily.    Marland Kitchen albuterol (PROAIR HFA) 108 (90 Base) MCG/ACT inhaler Inhale 2 puffs into the lungs every 4 (four) hours as needed for wheezing or shortness of breath. 18 g 5  . Alpha-D-Galactosidase (BEANO PO) Take 1 capsule by mouth 3 (three) times daily with meals.    . Alpha-Lipoic Acid 600 MG CAPS Take 600 mg by mouth daily.     Marland Kitchen alum & mag hydroxide-simeth (GELUSIL) 200-200-25 MG chewable tablet Chew 1 tablet by mouth every 6 (six) hours as needed for flatulence.    Marland Kitchen atorvastatin (LIPITOR) 20 MG tablet Take 1 tablet (20 mg total) by mouth daily. 90 tablet 3  . azelastine (ASTELIN) 0.1 % nasal spray USE 2 SPRAYS IN EACH NOSTRIL DAILY AS DIRECTED 30 mL 5  . cetirizine (ZYRTEC) 10 MG tablet Take 5 mg by mouth every evening.     . Cholecalciferol (VITAMIN D3) 10000 units TABS Take 1,000 Units by mouth daily.    . Colchicine 0.6 MG CAPS TAKE 1 CAPSULE BY MOUTH DAILY AS NEEDED 30 capsule 2  . CREON 24000-76000 units CPEP TAKE 1 CAPSULE BY MOUTH THREE TIMES DAILY (WITH  MEALS) 270 each 2  . cyproheptadine (PERIACTIN) 4 MG tablet Take two tablets every evening 60 tablet 1  . denosumab (PROLIA) 60 MG/ML SOLN injection Inject 60 mg into the skin every 6 (six) months. Administer in upper arm, thigh, or abdomen    . dexlansoprazole (DEXILANT) 60 MG capsule Take 1 capsule (60 mg total) by mouth daily. 30 capsule 2  . diazepam (VALIUM) 5 MG tablet Take 1 tablet (5 mg total) by mouth at bedtime as needed (TMJ pain). (Patient not taking: Reported on 05/01/2017) 20 tablet 0  . diclofenac (VOLTAREN) 75 MG EC tablet Take 1 tablet (75 mg total) by mouth 2 (two) times daily. 30 tablet 0  . EPINEPHrine (EPIPEN 2-PAK IJ) Inject as directed.    . gabapentin (NEURONTIN) 600 MG tablet TAKE 1 TABLET BY MOUTH DAILY 90 tablet 3  . guaifenesin (HUMIBID E) 400 MG TABS tablet Take 400 mg by mouth 2 (two) times daily.    . Lactase (LACTAID FAST ACT) 9000 units TABS Take 18,000 Units by mouth 4 (four) times daily as needed (DAIRY).    Marland Kitchen lidocaine (LIDODERM) 5 % Place 1 patch onto the skin daily. Remove & Discard patch within 12 hours or as directed by MD 90 patch 3  . meclizine (ANTIVERT) 25 MG tablet Take 25 mg by mouth every 6 (six) hours as needed for dizziness.    . methimazole (TAPAZOLE) 5 MG tablet Take 1 tablet (5 mg total) by mouth 3 (  three) times daily. 270 tablet 1  . mirtazapine (REMERON SOL-TAB) 30 MG disintegrating tablet DISOLVE 1 TABLET BY MOUTH EACH NIGHT AT BEDTIME 30 tablet 5  . mometasone (NASONEX) 50 MCG/ACT nasal spray Place 2 sprays into the nose 2 (two) times daily. 17 g 0  . mometasone-formoterol (DULERA) 200-5 MCG/ACT AERO Inhale 2 puffs into the lungs 2 (two) times daily. 1 Inhaler 3  . montelukast (SINGULAIR) 10 MG tablet Take 1 tablet (10 mg total) by mouth daily. 30 tablet 5  . Multiple Minerals-Vitamins (CALCIUM CITRATE PLUS/MAGNESIUM PO) Take 1 tablet by mouth 2 (two) times daily.    . mupirocin ointment (BACTROBAN) 2 % Place 1 application into the nose 3  (three) times daily as needed (DRY NOSE). (Patient not taking: Reported on 05/01/2017) 22 g 1  . nystatin (MYCOSTATIN) 100000 UNIT/ML suspension USE AS DIRECTED 5MLS IN MOUTH OR THROAT 4 TIMES DAILY 473 mL 2  . nystatin-triamcinolone ointment (MYCOLOG) Apply 1 application topically 3 (three) times daily as needed (YEAST INFECTION).     Marland Kitchen predniSONE (DELTASONE) 20 MG tablet Take 3 daily for 2 days, then 2 daily for 2 days, then 1 daily for 2 days. 12 tablet 0  . Prenatal Vit-Fe Fumarate-FA (PREPLUS) 27-1 MG TABS TAKE 1 TABLET BY MOUTH DAILY 30 tablet 3  . PRESCRIPTION MEDICATION Place 2 puffs into both ears daily as needed. Pt has compounded capsule (powder) that she uses In her ears as needed; CS (X) AHCL    CHLORAMP/SULFU/AMPHO B/ H DROC 100-100-5 mg capsule    . Probiotic Product (ALIGN) 4 MG CAPS Take 1 tablet by mouth daily.     Marland Kitchen PROCTO-MED HC 2.5 % rectal cream APPLY AS NEEDED AS DIRECTED 30 g 5  . Propylene Glycol-Glycerin (SOOTHE OP) Place 1 drop into both eyes 2 (two) times daily.     . ranitidine (ZANTAC) 300 MG tablet Take 1 tablet (300 mg total) by mouth daily. 30 tablet 5  . Respiratory Therapy Supplies (FLUTTER) DEVI Use as directed 1 each 0  . SIMPLY SALINE NA Place 1 spray into both nostrils at bedtime.    Marland Kitchen Spacer/Aero-Holding Chambers (AEROCHAMBER MV) inhaler Use as instructed 1 each 0  . Tiotropium Bromide Monohydrate (SPIRIVA RESPIMAT) 2.5 MCG/ACT AERS Inhale 2 puffs into the lungs daily. 1 Inhaler 2   Facility-Administered Medications Prior to Visit  Medication Dose Route Frequency Provider Last Rate Last Dose  . Mepolizumab SOLR 100 mg  100 mg Subcutaneous Q28 days Simonne Maffucci B, MD   100 mg at 05/09/17 0900    Review of Systems  Constitutional: Positive for malaise/fatigue. Negative for diaphoresis and weight loss.  HENT: Negative for congestion and ear pain.   Respiratory: Positive for shortness of breath. Negative for cough, wheezing and stridor.     Cardiovascular: Negative for palpitations, claudication and leg swelling.  Skin: Positive for rash.      Objective:  Physical Exam   Vitals:   05/15/17 0956  BP: 126/72  Pulse: 78  SpO2: 99%  Weight: 134 lb (60.8 kg)  Height: 5' 1.5" (1.562 m)    Gen: chronically ill, cushingoid appearing HENT: OP clear, TM's clear, neck supple PULM: CTA B, normal percussion CV: RRR, no mgr, trace edema GI: BS+, soft, nontender Derm: thin skin, no cyanosis or rash Psyche: normal mood and affect   CBC    Component Value Date/Time   WBC 6.4 02/20/2017 0808   RBC 3.89 02/20/2017 0808   HGB 12.8 02/20/2017 0808  HCT 37.9 02/20/2017 0808   PLT 230 02/20/2017 0808   MCV 97.4 02/20/2017 0808   MCH 32.9 02/20/2017 0808   MCHC 33.8 02/20/2017 0808   RDW 13.0 02/20/2017 0808   LYMPHSABS 2,234 02/20/2017 0808   MONOABS 1,264 (H) 08/20/2016 1103   EOSABS 38 02/20/2017 0808   BASOSABS 38 02/20/2017 0808   EXHALED NO: 04/2017 15 ppm  PFT 08/08/15: FVC 2.08 L (75%) FEV1 1.73 L (82%) FEV1/FVC 0.83 FEF 25-75 2.41 L (129%) no bronchodilator response DLCO corrected 66% (hemoglobin 10.5) 04/29/15: FVC 2.24 L (81%) FEV1 1.95 L (92%) FEV1/FVC 0.87 FEF 25-75 2.59 L (137%) no bronchodilator response TLC 3.91 L (84%) RV 83% ERV 61% DLCO corrected 62% (hemoglobin 13.7) 04/10/12:FVC 1.90 L (69%) FEV1 1.49 L (75%) FEV1/FVC 0.78 FEF 25-75 1.39 L (59%) TLC 3.55 L (80%) RV 62% ERV 52% DLCO uncorrected 72%  6MWT 08/08/15:  Walked 267 meters / Baseline Sat 99% on RA / Nadir Sat 97% on RA  IMAGING CT angiogram chest February 2018 images independently reviewed by me showing atelectatic changes in the bases of the lungs bilaterally, I question whether or not there is mild tracheobronchomalacia, no pulmonary embolism.  CT CHEST W/O 11/03/15: No pleural effusion or thickening. No pathologic mediastinal adenopathy. No pericardial effusion. Tree-in-bud opacification within right upper lobe as well as patchy  opacification linearly in right middle lobe and inferior lingula.  CT MAXILLOFACIAL W/O 08/12/15 (per radiologist): Paranasal sinuses clear without mucosal thickening or air-fluid level.  CT MAXILLOFACIAL 05/21/13 (per radiologist): Negative for chronic sinus disease.  CT CHEST W/O 06/05/12 (per radiologist): Resolution of previous right apical ground glass. Persistent tree-in-bud nodularity within basal portion RUL.  MICROBIOLOGY Sputum Ctx (11/22/15):  Normal Oral Flora / AFB negative / Fungus negative  Sputum Ctx (09/06/15):  Abundant Squamous Epithelials / Oral Flora / Mycobacterium gordonae / Fungus negative   LABS  February 2018 blood work at [facility]: IgE to Penicillium negative, cockroach negative, [place]berry negative, IgE to Aspergillus fumigatus was elevated, multiple other IgE labs were negative.  Alpha-1 antitrypsin testing that day was negative.  Apparently an Aspergillus panel was sent, though I cannot review these records in care everywhere.  11/09/14 BMP: 141/4.4/104/27/27/0.98/82/9.3 LFT: 4.1/6.5/0.08/17/28/31 CBC: 6.7/12.4/36.9/233  05/31/14 URINE TOX SCREEN: NEGATIVE  12/16/12 IgE: 206.6  04/01/12 RAST PANEL: RAGWEED 0.14 (class 0/1) IgE: 409.9 ASPERGILLUS ANTIGEN: 1.92  01/02/12 RAST PANEL: Weakly Pan-Positive Up to Class 1 IgE: 825.8  06/28/11 IgG: 623 IgA: 134  Records from her visit with Dr. Neldon Mc in November reviewed where she was treated for a mild flare of asthma with IM Solu-Medrol.  She had spirometry testing performed.  I looked at the data and the flow volume loop was not consistent with airflow obstruction.     Assessment & Plan:   Dyspnea, unspecified type - Plan: DG Chest 2 View  Severe persistent asthma, uncomplicated - Plan: Pulmonary function test, Nitric oxide  Discussion: Franziska presents to my clinic for evaluation of ongoing severe persistent asthma.  Objectively she has had mildly elevated serum eosinophils, essentially  normal-appearing chest imaging, and elevated serum IgE in the past, a specific IgE allergen to Aspergillus has been elevated as well.  Notably, I have never seen any evidence of airflow obstruction on my review of multiple pulmonary function testing including the raw data from the most recent spirometry testing.  She did have some sort of atelectasis noted on a CT angiogram of her chest from 2018.  I think it is reasonable to  continue treating her as if she has severe persistent asthma, though it would be nice to have at least some degree of airflow obstruction seen on lung function testing to make me feel more comfortable with this diagnosis.  Further, she has not responded to essentially the full force Western medicine has to offer for severe persistent asthma.  I think this has to beg the question whether or not there is something else causing her symptoms.  Plan: Severe persistent asthma: Continued Nucala injections as you are doing Continue Dulera Continue Spiriva, however moving forward make sure you press the button so that you actually get the medicine  Shortness of breath: As described today I think we need to get a repeat lung function test and chest imaging and question whether or not there could be something else making you short of breath. If either of these tests are abnormal I may repeat a high-resolution CT scan of your chest  It was nice meeting you today, I look forward to seeing you back in about 1-2 months to go over these results  25 minutes spent with her, 42 min visit.   Current Outpatient Medications:  .  acetaminophen (TYLENOL) 650 MG CR tablet, Take 1,300 mg by mouth 2 (two) times daily., Disp: , Rfl:  .  albuterol (PROAIR HFA) 108 (90 Base) MCG/ACT inhaler, Inhale 2 puffs into the lungs every 4 (four) hours as needed for wheezing or shortness of breath., Disp: 18 g, Rfl: 5 .  Alpha-D-Galactosidase (BEANO PO), Take 1 capsule by mouth 3 (three) times daily with meals.,  Disp: , Rfl:  .  Alpha-Lipoic Acid 600 MG CAPS, Take 600 mg by mouth daily. , Disp: , Rfl:  .  alum & mag hydroxide-simeth (GELUSIL) 200-200-25 MG chewable tablet, Chew 1 tablet by mouth every 6 (six) hours as needed for flatulence., Disp: , Rfl:  .  atorvastatin (LIPITOR) 20 MG tablet, Take 1 tablet (20 mg total) by mouth daily., Disp: 90 tablet, Rfl: 3 .  azelastine (ASTELIN) 0.1 % nasal spray, USE 2 SPRAYS IN EACH NOSTRIL DAILY AS DIRECTED, Disp: 30 mL, Rfl: 5 .  cetirizine (ZYRTEC) 10 MG tablet, Take 5 mg by mouth every evening. , Disp: , Rfl:  .  Cholecalciferol (VITAMIN D3) 10000 units TABS, Take 1,000 Units by mouth daily., Disp: , Rfl:  .  Colchicine 0.6 MG CAPS, TAKE 1 CAPSULE BY MOUTH DAILY AS NEEDED, Disp: 30 capsule, Rfl: 2 .  CREON 24000-76000 units CPEP, TAKE 1 CAPSULE BY MOUTH THREE TIMES DAILY (WITH MEALS), Disp: 270 each, Rfl: 2 .  cyproheptadine (PERIACTIN) 4 MG tablet, Take two tablets every evening, Disp: 60 tablet, Rfl: 1 .  denosumab (PROLIA) 60 MG/ML SOLN injection, Inject 60 mg into the skin every 6 (six) months. Administer in upper arm, thigh, or abdomen, Disp: , Rfl:  .  dexlansoprazole (DEXILANT) 60 MG capsule, Take 1 capsule (60 mg total) by mouth daily., Disp: 30 capsule, Rfl: 2 .  diazepam (VALIUM) 5 MG tablet, Take 1 tablet (5 mg total) by mouth at bedtime as needed (TMJ pain). (Patient not taking: Reported on 05/01/2017), Disp: 20 tablet, Rfl: 0 .  diclofenac (VOLTAREN) 75 MG EC tablet, Take 1 tablet (75 mg total) by mouth 2 (two) times daily., Disp: 30 tablet, Rfl: 0 .  EPINEPHrine (EPIPEN 2-PAK IJ), Inject as directed., Disp: , Rfl:  .  gabapentin (NEURONTIN) 600 MG tablet, TAKE 1 TABLET BY MOUTH DAILY, Disp: 90 tablet, Rfl: 3 .  guaifenesin (HUMIBID  E) 400 MG TABS tablet, Take 400 mg by mouth 2 (two) times daily., Disp: , Rfl:  .  Lactase (LACTAID FAST ACT) 9000 units TABS, Take 18,000 Units by mouth 4 (four) times daily as needed (DAIRY)., Disp: , Rfl:  .   lidocaine (LIDODERM) 5 %, Place 1 patch onto the skin daily. Remove & Discard patch within 12 hours or as directed by MD, Disp: 90 patch, Rfl: 3 .  meclizine (ANTIVERT) 25 MG tablet, Take 25 mg by mouth every 6 (six) hours as needed for dizziness., Disp: , Rfl:  .  methimazole (TAPAZOLE) 5 MG tablet, Take 1 tablet (5 mg total) by mouth 3 (three) times daily., Disp: 270 tablet, Rfl: 1 .  mirtazapine (REMERON SOL-TAB) 30 MG disintegrating tablet, DISOLVE 1 TABLET BY MOUTH EACH NIGHT AT BEDTIME, Disp: 30 tablet, Rfl: 5 .  mometasone (NASONEX) 50 MCG/ACT nasal spray, Place 2 sprays into the nose 2 (two) times daily., Disp: 17 g, Rfl: 0 .  mometasone-formoterol (DULERA) 200-5 MCG/ACT AERO, Inhale 2 puffs into the lungs 2 (two) times daily., Disp: 1 Inhaler, Rfl: 3 .  montelukast (SINGULAIR) 10 MG tablet, Take 1 tablet (10 mg total) by mouth daily., Disp: 30 tablet, Rfl: 5 .  Multiple Minerals-Vitamins (CALCIUM CITRATE PLUS/MAGNESIUM PO), Take 1 tablet by mouth 2 (two) times daily., Disp: , Rfl:  .  mupirocin ointment (BACTROBAN) 2 %, Place 1 application into the nose 3 (three) times daily as needed (DRY NOSE). (Patient not taking: Reported on 05/01/2017), Disp: 22 g, Rfl: 1 .  nystatin (MYCOSTATIN) 100000 UNIT/ML suspension, USE AS DIRECTED 5MLS IN MOUTH OR THROAT 4 TIMES DAILY, Disp: 473 mL, Rfl: 2 .  nystatin-triamcinolone ointment (MYCOLOG), Apply 1 application topically 3 (three) times daily as needed (YEAST INFECTION). , Disp: , Rfl:  .  predniSONE (DELTASONE) 20 MG tablet, Take 3 daily for 2 days, then 2 daily for 2 days, then 1 daily for 2 days., Disp: 12 tablet, Rfl: 0 .  Prenatal Vit-Fe Fumarate-FA (PREPLUS) 27-1 MG TABS, TAKE 1 TABLET BY MOUTH DAILY, Disp: 30 tablet, Rfl: 3 .  PRESCRIPTION MEDICATION, Place 2 puffs into both ears daily as needed. Pt has compounded capsule (powder) that she uses In her ears as needed; CS (X) AHCL    CHLORAMP/SULFU/AMPHO B/ H DROC 100-100-5 mg capsule, Disp: , Rfl:    .  Probiotic Product (ALIGN) 4 MG CAPS, Take 1 tablet by mouth daily. , Disp: , Rfl:  .  PROCTO-MED HC 2.5 % rectal cream, APPLY AS NEEDED AS DIRECTED, Disp: 30 g, Rfl: 5 .  Propylene Glycol-Glycerin (SOOTHE OP), Place 1 drop into both eyes 2 (two) times daily. , Disp: , Rfl:  .  ranitidine (ZANTAC) 300 MG tablet, Take 1 tablet (300 mg total) by mouth daily., Disp: 30 tablet, Rfl: 5 .  Respiratory Therapy Supplies (FLUTTER) DEVI, Use as directed, Disp: 1 each, Rfl: 0 .  SIMPLY SALINE NA, Place 1 spray into both nostrils at bedtime., Disp: , Rfl:  .  Spacer/Aero-Holding Chambers (AEROCHAMBER MV) inhaler, Use as instructed, Disp: 1 each, Rfl: 0 .  Tiotropium Bromide Monohydrate (SPIRIVA RESPIMAT) 2.5 MCG/ACT AERS, Inhale 2 puffs into the lungs daily., Disp: 1 Inhaler, Rfl: 2  Current Facility-Administered Medications:  Marland Kitchen  Mepolizumab SOLR 100 mg, 100 mg, Subcutaneous, Q28 days, Simonne Maffucci B, MD, 100 mg at 05/09/17 0900

## 2017-05-15 NOTE — Patient Instructions (Signed)
Severe persistent asthma: Continued Nucala injections as you are doing Continue Dulera Continue Spiriva, however moving forward make sure you press the button so that you actually get the medicine  Shortness of breath: As described today I think we need to get a repeat lung function test and chest imaging and question whether or not there could be something else making you short of breath. If either of these tests are abnormal I may repeat a high-resolution CT scan of your chest  It was nice meeting you today, I look forward to seeing you back in about 1-2 months to go over these results

## 2017-05-20 ENCOUNTER — Telehealth: Payer: Self-pay | Admitting: Pulmonary Disease

## 2017-05-20 NOTE — Telephone Encounter (Signed)
Called and spoke with patient. Patient stated that her order for Nasonex is not what she usually gets and would like for the Nasonex to be for 6 bottles and with 3 refills. Patient states that she uses 2 bottles of Nasonex a month.  Since this is not the typical amount. Dr. Lake Bells please advise about Rx. Thanks!

## 2017-05-20 NOTE — Telephone Encounter (Signed)
Called patient on numbers provided. Unable to reach. Left message for patient to give [place] a call back x1.

## 2017-05-20 NOTE — Telephone Encounter (Signed)
Pt is calling back [phone]

## 2017-05-21 ENCOUNTER — Encounter: Payer: Self-pay | Admitting: Pulmonary Disease

## 2017-05-21 NOTE — Telephone Encounter (Signed)
ATC pt, no answer. Left message for pt to call back.   I need the fax number for [employer]. Rx pended.

## 2017-05-21 NOTE — Telephone Encounter (Signed)
OK by me 

## 2017-05-22 MED ORDER — MOMETASONE FUROATE 50 MCG/ACT NA SUSP: 2.0000 | Freq: Two times a day (BID) | NASAL | 3 refills | Status: DC

## 2017-05-22 NOTE — Telephone Encounter (Signed)
Spoke with patient. She is aware that BQ is ok with the new prescription. Advised her that we can not fax RXs to the [employer] Patient Assistance program, so it will have to be mailed. Patient verbalized understanding.   RX has been placed in the mail to be sent to [employer].   Nothing else needed at time of call.

## 2017-05-27 ENCOUNTER — Telehealth: Payer: Self-pay | Admitting: Family Medicine

## 2017-05-27 ENCOUNTER — Encounter: Payer: Self-pay | Admitting: Family Medicine

## 2017-05-27 ENCOUNTER — Ambulatory Visit: Payer: Medicare Other | Admitting: Family Medicine

## 2017-05-27 VITALS — BP 136/76 | HR 80 | Temp 98.0°F | Resp 18 | Ht 62.0 in | Wt 134.0 lb

## 2017-05-27 DIAGNOSIS — M810 Age-related osteoporosis without current pathological fracture: Secondary | ICD-10-CM

## 2017-05-27 DIAGNOSIS — E0590 Thyrotoxicosis, unspecified without thyrotoxic crisis or storm: Secondary | ICD-10-CM | POA: Diagnosis not present

## 2017-05-27 DIAGNOSIS — E7800 Pure hypercholesterolemia, unspecified: Secondary | ICD-10-CM

## 2017-05-27 DIAGNOSIS — Z23 Encounter for immunization: Secondary | ICD-10-CM | POA: Diagnosis not present

## 2017-05-27 NOTE — Progress Notes (Signed)
Subjective:    Patient ID: Ebony Scott, female    DOB: 1950/05/30, 67 y.o.   MRN: [id number]  HPI  02/2017 Patient has a history of hyper thyroidism treated with PTU. Recently has switched to methimazole 5 mg 3 times a day. Since making the change, the patient reports weight gain although she has been on prednisone for her asthma in addition she also reports fatigue and decreased concentration with mild memory problems. She is due to recheck a TSH to see if her thyroid function is being overly suppressed by methimazole.  She is also on pravastatin 20 mg a day for hyperlipidemia. At that time, my plan was: Check TSH. May need to discontinue methimazole if TSH is elevated suggesting over suppression. Over suppression would certainly explain the weight gain, fatigue, and maybe even contribute to some of her memory problems. LDL cholesterol is poorly controlled. Discontinue pravastatin and replace with Lipitor 20 mg a day and recheck liver function test and fasting lipid panel in 3 months  05/27/17 Here for follow up.  Patient appears to be due for her final booster on Pneumovax 23.  Otherwise her preventative care is up-to-date.  She has been taking methimazole 5 mg twice a day.  She is due to recheck her TSH.  Previously she was on PTU.  Her TSH checked in January was actually a reflection of her taking PTU and not methimazole.  This will be the first TSH since making the change.  She denies any weight change, fatigue, palpitations.  She does report difficulty breathing that she is seeing her pulmonologist and has pulmonary function test scheduled for May 1.  At her last visit we did switch pravastatin to Lipitor to achieve better control of her LDL cholesterol.  She denies any myalgias or right upper quadrant pain.  She is due for her next Prolia injection for her osteoporosis in June. Past Medical History:  Diagnosis Date  . Allergic rhinitis   . Allergy    SEASONAL  . Anemia   . Anxiety    pt  denies  . Arthritis    "mostly the hands" (02/20/2012)  . Asthma   . Breast cancer ([place]) 1998   in remission  . Cataract    REMOVED  . Chronic lower back pain   . Chronic neck pain   . Complication of anesthesia    "had hard time waking up from it several times" (02/20/2012)  . Depression    "some; don't take anything for it" (02/20/2012)  . Diverticulosis   . DVT (deep venous thrombosis) ([place])   . Exertional dyspnea   . Fibromyalgia 11/2011  . GERD (gastroesophageal reflux disease)   . Graves disease   . Headache(784.0)    "related to allergies; more at different times during the year" (02/20/2012)  . Hemorrhoids   . Hiatal hernia    back and neck  . Hx of adenomatous colonic polyps 04/12/2016  . Hypercholesteremia    good cholesterol is high  . Hypothyroidism   . IBS (irritable bowel syndrome)   . Moderate persistent asthma    -FeV1 72% 2011, -IgE 102 2011, CT sinus Neg 2011  . Osteoporosis    on reclast yearly  . Pneumonia 04/2011; ~ 11/2011   "double; single" (02/20/2012)   Past Surgical History:  Procedure Laterality Date  . ANTERIOR AND POSTERIOR REPAIR  1990's  . APPENDECTOMY    . BREAST LUMPECTOMY  1998   left  . CARPOMETACARPEL ([place]) FUSION OF  THUMB WITH AUTOGRAFT FROM RADIUS  ~ 2009   "both thumbs" (02/20/2012)  . CATARACT EXTRACTION W/ INTRAOCULAR LENS  IMPLANT, BILATERAL  2012  . CERVICAL DISCECTOMY  10/2001   C5-C6  . CERVICAL FUSION  2003   C3-C4  . CHOLECYSTECTOMY    . COLONOSCOPY    . DEBRIDEMENT TENNIS ELBOW  ?1970's   right  . ESOPHAGOGASTRODUODENOSCOPY    . HYSTERECTOMY    . KNEE ARTHROPLASTY  ?1990's   "?right; w/cartilage repair" (02/20/2012)  . NASAL SEPTUM SURGERY  1980's  . POSTERIOR CERVICAL FUSION/FORAMINOTOMY  2004   "failed initial fusion; rewired  anterior neck" (02/20/2012)  . TONSILLECTOMY  ~ 1953  . VESICOVAGINAL FISTULA CLOSURE W/ TAH  1988  . VIDEO BRONCHOSCOPY Bilateral 08/23/2016   Procedure: VIDEO BRONCHOSCOPY WITH FLUORO;  Surgeon:  Javier Glazier, MD;  Location: [facility];  Service: Cardiopulmonary;  Laterality: Bilateral;    Current Outpatient Medications on File Prior to Visit  Medication Sig Dispense Refill  . acetaminophen (TYLENOL) 650 MG CR tablet Take 1,300 mg by mouth 2 (two) times daily.    Marland Kitchen albuterol (PROAIR HFA) 108 (90 Base) MCG/ACT inhaler Inhale 2 puffs into the lungs every 4 (four) hours as needed for wheezing or shortness of breath. 18 g 5  . Alpha-D-Galactosidase (BEANO PO) Take 1 capsule by mouth 3 (three) times daily with meals.    . Alpha-Lipoic Acid 600 MG CAPS Take 600 mg by mouth daily.     Marland Kitchen alum & mag hydroxide-simeth (GELUSIL) 200-200-25 MG chewable tablet Chew 1 tablet by mouth every 6 (six) hours as needed for flatulence.    Marland Kitchen atorvastatin (LIPITOR) 20 MG tablet Take 1 tablet (20 mg total) by mouth daily. 90 tablet 3  . azelastine (ASTELIN) 0.1 % nasal spray USE 2 SPRAYS IN EACH NOSTRIL DAILY AS DIRECTED 30 mL 5  . cetirizine (ZYRTEC) 10 MG tablet Take 5 mg by mouth every evening.     . Cholecalciferol (VITAMIN D3) 10000 units TABS Take 1,000 Units by mouth daily.    . Colchicine 0.6 MG CAPS TAKE 1 CAPSULE BY MOUTH DAILY AS NEEDED 30 capsule 2  . CREON 24000-76000 units CPEP TAKE 1 CAPSULE BY MOUTH THREE TIMES DAILY (WITH MEALS) 270 each 2  . cyproheptadine (PERIACTIN) 4 MG tablet Take two tablets every evening 60 tablet 1  . denosumab (PROLIA) 60 MG/ML SOLN injection Inject 60 mg into the skin every 6 (six) months. Administer in upper arm, thigh, or abdomen    . dexlansoprazole (DEXILANT) 60 MG capsule Take 1 capsule (60 mg total) by mouth daily. 30 capsule 2  . EPINEPHrine (EPIPEN 2-PAK IJ) Inject as directed.    . gabapentin (NEURONTIN) 600 MG tablet TAKE 1 TABLET BY MOUTH DAILY 90 tablet 3  . guaifenesin (HUMIBID E) 400 MG TABS tablet Take 400 mg by mouth 2 (two) times daily.    . Lactase (LACTAID FAST ACT) 9000 units TABS Take 18,000 Units by mouth 4 (four) times daily as needed  (DAIRY).    Marland Kitchen lidocaine (LIDODERM) 5 % Place 1 patch onto the skin daily. Remove & Discard patch within 12 hours or as directed by MD 90 patch 3  . meclizine (ANTIVERT) 25 MG tablet Take 25 mg by mouth every 6 (six) hours as needed for dizziness.    . methimazole (TAPAZOLE) 5 MG tablet Take 1 tablet (5 mg total) by mouth 3 (three) times daily. 270 tablet 1  . mirtazapine (REMERON SOL-TAB) 30 MG disintegrating  tablet DISOLVE 1 TABLET BY MOUTH EACH NIGHT AT BEDTIME 30 tablet 5  . mometasone (NASONEX) 50 MCG/ACT nasal spray Place 2 sprays into the nose 2 (two) times daily. 102 g 3  . mometasone-formoterol (DULERA) 200-5 MCG/ACT AERO Inhale 2 puffs into the lungs 2 (two) times daily. 1 Inhaler 3  . montelukast (SINGULAIR) 10 MG tablet Take 1 tablet (10 mg total) by mouth daily. 30 tablet 5  . Multiple Minerals-Vitamins (CALCIUM CITRATE PLUS/MAGNESIUM PO) Take 1 tablet by mouth 2 (two) times daily.    . mupirocin ointment (BACTROBAN) 2 % Place 1 application into the nose 3 (three) times daily as needed (DRY NOSE). 22 g 1  . nystatin (MYCOSTATIN) 100000 UNIT/ML suspension USE AS DIRECTED 5MLS IN MOUTH OR THROAT 4 TIMES DAILY 473 mL 2  . nystatin-triamcinolone ointment (MYCOLOG) Apply 1 application topically 3 (three) times daily as needed (YEAST INFECTION).     . Prenatal Vit-Fe Fumarate-FA (PREPLUS) 27-1 MG TABS TAKE 1 TABLET BY MOUTH DAILY 30 tablet 3  . PRESCRIPTION MEDICATION Place 2 puffs into both ears daily as needed. Pt has compounded capsule (powder) that she uses In her ears as needed; CS (X) AHCL    CHLORAMP/SULFU/AMPHO B/ H DROC 100-100-5 mg capsule    . Probiotic Product (ALIGN) 4 MG CAPS Take 1 tablet by mouth daily.     Marland Kitchen PROCTO-MED HC 2.5 % rectal cream APPLY AS NEEDED AS DIRECTED 30 g 5  . Propylene Glycol-Glycerin (SOOTHE OP) Place 1 drop into both eyes 2 (two) times daily.     . ranitidine (ZANTAC) 300 MG tablet Take 1 tablet (300 mg total) by mouth daily. 30 tablet 5  . Respiratory  Therapy Supplies (FLUTTER) DEVI Use as directed 1 each 0  . SIMPLY SALINE NA Place 1 spray into both nostrils at bedtime.    Marland Kitchen Spacer/Aero-Holding Chambers (AEROCHAMBER MV) inhaler Use as instructed 1 each 0  . Tiotropium Bromide Monohydrate (SPIRIVA RESPIMAT) 2.5 MCG/ACT AERS Inhale 2 puffs into the lungs daily. 1 Inhaler 2   Current Facility-Administered Medications on File Prior to Visit  Medication Dose Route Frequency Provider Last Rate Last Dose  . Mepolizumab SOLR 100 mg  100 mg Subcutaneous Q28 days Simonne Maffucci B, MD   100 mg at 05/09/17 0900   Allergies  Allergen Reactions  . Dust Mite Extract Shortness Of Breath and Other (See Comments)    "sneezing" (02/20/2012)  . Molds & Smuts Shortness Of Breath  . Morphine Sulfate Itching  . Other Shortness Of Breath and Other (See Comments)    Grass and weeds "sneezing; filled sinuses" (02/20/2012)  . Penicillins Rash and Other (See Comments)    "welts" (02/20/2012) Has patient had a PCN reaction causing immediate rash, facial/tongue/throat swelling, SOB or lightheadedness with hypotension: Unknown Has patient had a PCN reaction causing severe rash involving mucus membranes or skin necrosis: No Has patient had a PCN reaction that required hospitalization: No Has patient had a PCN reaction occurring within the last 10 years: No If all of the above answers are "NO", then may proceed with Cephalosporin use.   Marland Kitchen Reclast [Zoledronic Acid] Other (See Comments)    Fever, Put in hospital, dr said it was a reaction from a reaction   . Rofecoxib Swelling    Celebrex/vioxx REACTION: feet swelling  . Shrimp Flavor Anaphylaxis    ALL SHELLFISH  . Tetracycline Hcl Nausea And Vomiting  . Xolair [Omalizumab] Other (See Comments)    Caused Blood clot  .  Dilaudid [Hydromorphone Hcl] Itching  . Hydrocodone-Acetaminophen Nausea And Vomiting  . Levofloxacin Other (See Comments)    REACTION: GI upset  . Oxycodone Hcl Nausea And Vomiting  .  Paroxetine Nausea And Vomiting    Paxil   . Diltiazem Swelling  . Tree Extract Other (See Comments)    "tested and told I was allergic to it; never experienced a reaction to it" (02/20/2012)   Social History   Socioeconomic History  . Marital status: Divorced    Spouse name: Not on file  . Number of children: 2  . Years of education: college  . Highest education level: Not on file  Occupational History  . Occupation: Disabled    Comment: Retired Engineer, production: RETIRED  Social Needs  . Financial resource strain: Not on file  . Food insecurity:    Worry: Not on file    Inability: Not on file  . Transportation needs:    Medical: Not on file    Non-medical: Not on file  Tobacco Use  . Smoking status: Passive Smoke Exposure - Never Smoker  . Smokeless tobacco: Never Used  . Tobacco comment: Parents  Substance and Sexual Activity  . Alcohol use: Yes    Alcohol/week: 0.0 oz    Comment: 02/20/2012 "couple glasses of wine/6 months"  . Drug use: No  . Sexual activity: Never  Lifestyle  . Physical activity:    Days per week: Not on file    Minutes per session: Not on file  . Stress: Not on file  Relationships  . Social connections:    Talks on phone: Not on file    Gets together: Not on file    Attends religious service: Not on file    Active member of club or organization: Not on file    Attends meetings of clubs or organizations: Not on file    Relationship status: Not on file  . Intimate partner violence:    Fear of current or ex partner: Not on file    Emotionally abused: Not on file    Physically abused: Not on file    Forced sexual activity: Not on file  Other Topics Concern  . Not on file  Social History Narrative   Patient lives at home alone. Patient  divorced.    Patient has her BS degree.   Right handed.   Caffeine- sometimes coffee.      [facility]:   Born in [place], [place]. She worked as a Copywriter, advertising. She has no  pets currently. She does have indoor plants. Previously had mold in her home that was remediated. Carpet was removed.             Review of Systems  All other systems reviewed and are negative.      Objective:   Physical Exam  Constitutional: She appears well-developed and well-nourished. No distress.  HENT:  Nose: Nose normal.  Mouth/Throat: Oropharynx is clear and moist. No oropharyngeal exudate.  Neck: Neck supple. No thyromegaly present.  Cardiovascular: Normal rate, regular rhythm and normal heart sounds.  Pulmonary/Chest: Effort normal. No respiratory distress. She has no wheezes. She has no rales.  Abdominal: Soft. Bowel sounds are normal.  Musculoskeletal: She exhibits no edema.  Skin: She is not diaphoretic.  Vitals reviewed.         Assessment & Plan:  Hyperthyroidism - Plan: TSH  Pure hypercholesterolemia - Plan: COMPLETE METABOLIC PANEL WITH GFR, Lipid panel  Osteoporosis, unspecified  osteoporosis type, unspecified pathological fracture presence  Physical exam today is normal.  Check a TSH to ensure adequate suppression of her hyperthyroidism with methimazole 5 mg twice daily.  Check CMP and fasting lipid panel.  Goal LDL cholesterol be less than 130.  Patient will receive her Prolia injection in June.  She received a booster on Pneumovax 23 today.

## 2017-05-27 NOTE — Telephone Encounter (Signed)
Over the counter clearasil.

## 2017-05-27 NOTE — Telephone Encounter (Signed)
Patient called Ebony Scott stating that you had mentioned a medication for the spot on her face but you did not call anything in for it?

## 2017-05-27 NOTE — Addendum Note (Signed)
Addended by: Shary Decamp B on: 05/27/2017 11:25 AM   Modules accepted: Orders

## 2017-05-28 LAB — EXTRA LAV TOP TUBE

## 2017-05-28 LAB — COMPLETE METABOLIC PANEL WITH GFR
AG Ratio: 2.1 (calc) (ref 1.0–2.5)
ALT: 44 U/L — ABNORMAL HIGH (ref 6–29)
AST: 30 U/L (ref 10–35)
Albumin: 4.2 g/dL (ref 3.6–5.1)
Alkaline phosphatase (APISO): 41 U/L (ref 33–130)
BUN/Creatinine Ratio: 22 (calc) (ref 6–22)
BUN: 26 mg/dL — ABNORMAL HIGH (ref 7–25)
CO2: 24 mmol/L (ref 20–32)
Calcium: 9.8 mg/dL (ref 8.6–10.4)
Chloride: 109 mmol/L (ref 98–110)
Creat: 1.18 mg/dL — ABNORMAL HIGH (ref 0.50–0.99)
GFR, Est African American: 54 mL/min/{1.73_m2} — ABNORMAL LOW (ref >=60)
GFR, Est Non African American: 47 mL/min/{1.73_m2} — ABNORMAL LOW (ref >=60)
Globulin: 2 g/dL (calc) (ref 1.9–3.7)
Glucose, Bld: 87 mg/dL (ref 65–99)
Potassium: 4.2 mmol/L (ref 3.5–5.3)
Sodium: 142 mmol/L (ref 135–146)
Total Bilirubin: 0.4 mg/dL (ref 0.2–1.2)
Total Protein: 6.2 g/dL (ref 6.1–8.1)

## 2017-05-28 LAB — LIPID PANEL
Cholesterol: 194 mg/dL (ref <200)
HDL: 76 mg/dL (ref >50)
LDL Cholesterol (Calc): 92 mg/dL (calc)
Non-HDL Cholesterol (Calc): 118 mg/dL (calc) (ref <130)
Total CHOL/HDL Ratio: 2.6 (calc) (ref <5.0)
Triglycerides: 156 mg/dL — ABNORMAL HIGH (ref <150)

## 2017-05-28 LAB — TSH: TSH: 3 mIU/L (ref 0.40–4.50)

## 2017-05-28 NOTE — Telephone Encounter (Signed)
Pt aware of recommendation °

## 2017-05-29 ENCOUNTER — Other Ambulatory Visit: Payer: Self-pay | Admitting: Allergy and Immunology

## 2017-05-29 ENCOUNTER — Other Ambulatory Visit: Payer: Self-pay | Admitting: Family Medicine

## 2017-06-05 ENCOUNTER — Ambulatory Visit (INDEPENDENT_AMBULATORY_CARE_PROVIDER_SITE_OTHER): Payer: Medicare Other

## 2017-06-05 DIAGNOSIS — J4550 Severe persistent asthma, uncomplicated: Secondary | ICD-10-CM

## 2017-06-10 MED ORDER — MEPOLIZUMAB 100 MG ~~LOC~~ SOLR
100.0000 mg | Freq: Once | SUBCUTANEOUS | Status: AC
  Administered 2017-06-05: 100 mg via SUBCUTANEOUS

## 2017-06-17 ENCOUNTER — Telehealth: Payer: Self-pay | Admitting: Pulmonary Disease

## 2017-06-17 ENCOUNTER — Encounter: Payer: Self-pay | Admitting: Family Medicine

## 2017-06-17 NOTE — Telephone Encounter (Signed)
Called and spoke with patient, advised her that they do not want patients using inhalers 3 hours prior to the appointment time. She verbalized understanding.

## 2017-06-18 ENCOUNTER — Encounter: Payer: Self-pay | Admitting: *Deleted

## 2017-06-18 MED ORDER — ZOSTER VAC RECOMB ADJUVANTED 50 MCG/0.5ML IM SUSR: 0.5000 mL | Freq: Once | INTRAMUSCULAR | 1 refills | Status: AC

## 2017-06-19 ENCOUNTER — Ambulatory Visit: Payer: Medicare Other | Admitting: Pulmonary Disease

## 2017-06-19 ENCOUNTER — Ambulatory Visit (INDEPENDENT_AMBULATORY_CARE_PROVIDER_SITE_OTHER): Payer: Medicare Other | Admitting: Pulmonary Disease

## 2017-06-19 DIAGNOSIS — J4550 Severe persistent asthma, uncomplicated: Secondary | ICD-10-CM

## 2017-06-19 LAB — PULMONARY FUNCTION TEST
DL/VA % pred: 85 %
DL/VA: 3.78 ml/min/mmHg/L
DLCO unc % pred: 66 %
DLCO unc: 13.68 ml/min/mmHg
FEF 25-75 Post: 2.34 L/sec
FEF 25-75 Pre: 3.05 L/sec
FEF2575-%Change-Post: -23 %
FEF2575-%Pred-Post: 131 %
FEF2575-%Pred-Pre: 171 %
FEV1-%Change-Post: -4 %
FEV1-%Pred-Post: 84 %
FEV1-%Pred-Pre: 88 %
FEV1-Post: 1.72 L
FEV1-Pre: 1.8 L
FEV1FVC-%Change-Post: 2 %
FEV1FVC-%Pred-Pre: 118 %
FEV6-%Change-Post: -6 %
FEV6-%Pred-Post: 72 %
FEV6-%Pred-Pre: 77 %
FEV6-Post: 1.86 L
FEV6-Pre: 2 L
FEV6FVC-%Pred-Post: 104 %
FEV6FVC-%Pred-Pre: 104 %
FVC-%Change-Post: -6 %
FVC-%Pred-Post: 69 %
FVC-%Pred-Pre: 74 %
FVC-Post: 1.86 L
FVC-Pre: 2 L
Post FEV1/FVC ratio: 93 %
Post FEV6/FVC ratio: 100 %
Pre FEV1/FVC ratio: 90 %
Pre FEV6/FVC Ratio: 100 %
RV % pred: 86 %
RV: 1.76 L
TLC % pred: 85 %
TLC: 3.95 L

## 2017-06-19 NOTE — Progress Notes (Signed)
PFT completed today.  

## 2017-06-21 ENCOUNTER — Other Ambulatory Visit: Payer: Self-pay | Admitting: Allergy and Immunology

## 2017-06-21 ENCOUNTER — Other Ambulatory Visit: Payer: Self-pay | Admitting: Internal Medicine

## 2017-06-21 ENCOUNTER — Other Ambulatory Visit: Payer: Self-pay | Admitting: Family Medicine

## 2017-06-24 NOTE — Telephone Encounter (Signed)
May I refill Sir, you did colon 03/2016?

## 2017-06-26 NOTE — Telephone Encounter (Signed)
Dr Gessner refilled. 

## 2017-06-27 ENCOUNTER — Other Ambulatory Visit: Payer: Self-pay | Admitting: Family Medicine

## 2017-07-01 ENCOUNTER — Telehealth: Payer: Self-pay | Admitting: Pulmonary Disease

## 2017-07-01 NOTE — Telephone Encounter (Signed)
Spoke with nucala rep 1st. After 4 or 5 reps and 2 ph. Calls I was able to give the pharmacist a new rx with pt's new Dr. Edwina Barth. Rx will have to be processed, pharmacist said he would put a rush on it. I will call back 07/02/17 before 2:00 to see if they can get her med. Here by 07/03/17. I called pt. And made her aware. I told her if she didn't hear from me tomorrow to come in [place]. For her shot.

## 2017-07-02 ENCOUNTER — Telehealth: Payer: Self-pay | Admitting: Pulmonary Disease

## 2017-07-02 MED ORDER — PREDNISONE 10 MG PO TABS: ORAL_TABLET | ORAL | 0 refills | Status: DC

## 2017-07-02 NOTE — Telephone Encounter (Signed)
Called pt and advised message from the provider. Pt understood and verbalized understanding. Nothing further is needed.    Rx sent in. 

## 2017-07-02 NOTE — Telephone Encounter (Signed)
Continue nucala Prednisone taper: Take 40mg  po daily for 3 days, then take 30mg  po daily for 3 days, then take 20mg  po daily for two days, then take 10mg  po daily for 2 days Call if no ipmrovement

## 2017-07-02 NOTE — Telephone Encounter (Signed)
1 prefilled syringe Ordered date: 07/02/17 Shipping date: 07/03/17

## 2017-07-02 NOTE — Telephone Encounter (Signed)
Pt c/o prod cough with yellow mucus worsening X1 week.  Also notes stable sob with exertion.  Denies sinus congestion, fever, chest pain, chills, nausea, vomiting.  Has not taken anything to help with s/s.  Requesting an abx.   Pt is scheduled for a nucala injection tomorrow, wants to know if she can still come in to get this injection, or if this needs to be rescheduled.    Pharmacy: [facility].   BQ please advise.  Thanks.

## 2017-07-03 ENCOUNTER — Telehealth: Payer: Self-pay | Admitting: *Deleted

## 2017-07-03 ENCOUNTER — Ambulatory Visit: Payer: Medicare Other

## 2017-07-03 NOTE — Telephone Encounter (Signed)
Pt is calling back [phone]

## 2017-07-03 NOTE — Telephone Encounter (Signed)
1 Vial Order Date: 07/03/17 Shipping Date: 07/03/17

## 2017-07-03 NOTE — Telephone Encounter (Signed)
Called pt. Made her aware her nucala won't be in til tomorrow. Lmom, to call and ask one of the schedulers to reshedule her appt.Marland Kitchen

## 2017-07-03 NOTE — Telephone Encounter (Signed)
TS did you ever find out when the Nucala was coming in so we can advise pt?

## 2017-07-03 NOTE — Telephone Encounter (Signed)
Nucala has been ordered and is coming in 07/04/17. I made pt an appt for 07/04/17 at 3:30. Nothing further needed.

## 2017-07-04 ENCOUNTER — Telehealth: Payer: Self-pay | Admitting: Pulmonary Disease

## 2017-07-04 ENCOUNTER — Ambulatory Visit (INDEPENDENT_AMBULATORY_CARE_PROVIDER_SITE_OTHER): Payer: Medicare Other

## 2017-07-04 DIAGNOSIS — J4550 Severe persistent asthma, uncomplicated: Secondary | ICD-10-CM | POA: Diagnosis not present

## 2017-07-04 NOTE — Telephone Encounter (Signed)
1 vial Arrival Date:07/04/17 Lot #: [id number]

## 2017-07-04 NOTE — Telephone Encounter (Signed)
1 vial Arrival Date:07/04/2017 Lot #: [id number]

## 2017-07-05 MED ORDER — MEPOLIZUMAB 100 MG ~~LOC~~ SOLR
100.0000 mg | Freq: Once | SUBCUTANEOUS | Status: AC
  Administered 2017-07-04: 100 mg via SUBCUTANEOUS

## 2017-07-08 ENCOUNTER — Encounter: Payer: Self-pay | Admitting: Pulmonary Disease

## 2017-07-09 NOTE — Telephone Encounter (Signed)
BQ please advise on PFT results. Thanks

## 2017-07-11 NOTE — Telephone Encounter (Signed)
Called spoke with patient to offer appt with TP Pt declined appt d/t copay/finances Pt stated she will keep her appt with Dr Neldon Mc on 5/28 and of course the appt with BQ on 6/7  In the meantime, pt is wondering if there might be another test that might help pinpoint where her continued dyspnea is coming from? Pt stated that she had all mold removed from her house but is aware there is some mold on her back porch with the wet weather we had this fall/winter/spring.  She wonders if closing this in would help and if BQ would consider writing a letter for her explaining the medical necessity of this to help with cost.  Dr Lake Bells please advise, thank you.

## 2017-07-12 ENCOUNTER — Encounter: Payer: Self-pay | Admitting: Family Medicine

## 2017-07-16 ENCOUNTER — Encounter: Payer: Self-pay | Admitting: Allergy and Immunology

## 2017-07-16 ENCOUNTER — Ambulatory Visit: Payer: Medicare Other | Admitting: Allergy and Immunology

## 2017-07-16 VITALS — BP 120/78 | HR 76 | Temp 98.4°F | Resp 20

## 2017-07-16 DIAGNOSIS — J028 Acute pharyngitis due to other specified organisms: Secondary | ICD-10-CM

## 2017-07-16 DIAGNOSIS — J3089 Other allergic rhinitis: Secondary | ICD-10-CM

## 2017-07-16 DIAGNOSIS — G500 Trigeminal neuralgia: Secondary | ICD-10-CM

## 2017-07-16 DIAGNOSIS — K219 Gastro-esophageal reflux disease without esophagitis: Secondary | ICD-10-CM

## 2017-07-16 DIAGNOSIS — J4550 Severe persistent asthma, uncomplicated: Secondary | ICD-10-CM | POA: Diagnosis not present

## 2017-07-16 MED ORDER — MOMETASONE FURO-FORMOTEROL FUM 200-5 MCG/ACT IN AERO: 2.0000 | INHALATION_SPRAY | Freq: Two times a day (BID) | RESPIRATORY_TRACT | 5 refills | Status: DC

## 2017-07-16 MED ORDER — RANITIDINE HCL 300 MG PO TABS: 300.0000 mg | ORAL_TABLET | Freq: Every day | ORAL | 5 refills | Status: DC

## 2017-07-16 MED ORDER — DEXLANSOPRAZOLE 60 MG PO CPDR: 1.0000 | DELAYED_RELEASE_CAPSULE | Freq: Every day | ORAL | 5 refills | Status: DC

## 2017-07-16 MED ORDER — CYPROHEPTADINE HCL 4 MG PO TABS: ORAL_TABLET | ORAL | 5 refills | Status: DC

## 2017-07-16 MED ORDER — MONTELUKAST SODIUM 10 MG PO TABS: 10.0000 mg | ORAL_TABLET | Freq: Every day | ORAL | 5 refills | Status: DC

## 2017-07-16 MED ORDER — MUPIROCIN 2 % EX OINT: TOPICAL_OINTMENT | CUTANEOUS | 2 refills | Status: DC

## 2017-07-16 MED ORDER — TIOTROPIUM BROMIDE MONOHYDRATE 2.5 MCG/ACT IN AERS: 2.0000 | INHALATION_SPRAY | Freq: Every day | RESPIRATORY_TRACT | 5 refills | Status: DC

## 2017-07-16 MED ORDER — AZELASTINE HCL 0.1 % NA SOLN: NASAL | 5 refills | Status: DC

## 2017-07-16 MED ORDER — AZITHROMYCIN 250 MG PO TABS: ORAL_TABLET | ORAL | 0 refills | Status: DC

## 2017-07-16 NOTE — Patient Instructions (Addendum)
  1. 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 Continue Periactin 4 mg tablet  2 tablets at bedtime  2. 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 Continue Dexilant 60 mg in the morning  B. Continue ranitidine 300 mg in the evening  4. Continue to treat inflammation:   A. Dulera 200 2 inhalations twice a day  B. Nasonex one spray each nostril 1-2 times a day   C. montelukast 10 mg daily  D. Spiriva respimat 2 inhalations one time per day  5. Continue to prevent thrush:   A. nystatin swish and spit twice a day  6. Continue Nucala and EpiPen as prescribed by Dr. Lake Bells  7. Use bronchodilator and Astelin and Zyrtec and nasal saline and nasal bactroban if needed  8.  For this recent episode use azithromycin 250 mg 1 time per day for 6 days.  Further treatment?  9. Return to clinic in 6 months or earlier if problem

## 2017-07-16 NOTE — Progress Notes (Signed)
Follow-up Note  Referring Provider: [name], MD Primary Provider: [name], MD Date of Office Visit: 07/16/2017  Subjective:   Ebony Scott (DOB: May 19, 1950) is a 67 y.o. female who returns to the [facility] on 07/16/2017 in re-evaluation of the following:  HPI: Shakedra returns to this clinic in reevaluation of her allergic rhinoconjunctivitis and facial pain syndrome and reflux induced respiratory disease and asthma followed by Dr. Lake Bells and a recent issue with her throat.  Her last visit to this clinic was 15 January 2017.  She has developed a problem about 4 days ago with rather significant sore throat that has progressed the past several days.  It hurts to swallow.  She has burning in her throat and she has some burning in her upper chest and just feels bad in general.  She has not been having any fever and she has not been having any coughing and she has not been having any ugly nasal discharge.  2 weeks ago she was seen by Dr. Lake Bells for a cough with yellow sputum production and was given prednisone which she did finish this weekend.  That issue has improved tremendously.  She has visited with [facility] regarding second opinion of her pulmonary status.  She continues to use Nucala on a monthly basis and also continues on a large collection of anti-inflammatory medications for her airway.  She continues on daily nystatin to prevent thrush.  Her facial pain syndrome is doing great while using cyproheptadine.  She believes that her nose is doing relatively well at this point.  She still continues to use a nasal steroid and montelukast on a regular basis and also uses topical Bactroban in her nose just about every evening.  She believes that her reflux is under very good control at this point on her current therapy.  Allergies as of 07/16/2017      Reactions   Dust Mite Extract Shortness Of Breath, Other (See Comments)   "sneezing" (02/20/2012)   Molds & Smuts Shortness Of Breath   Morphine Sulfate Itching   Other Shortness Of Breath, Other (See Comments)   Grass and weeds "sneezing; filled sinuses" (02/20/2012)   Penicillins Rash, Other (See Comments)   "welts" (02/20/2012) Has patient had a PCN reaction causing immediate rash, facial/tongue/throat swelling, SOB or lightheadedness with hypotension: Unknown Has patient had a PCN reaction causing severe rash involving mucus membranes or skin necrosis: No Has patient had a PCN reaction that required hospitalization: No Has patient had a PCN reaction occurring within the last 10 years: No If all of the above answers are "NO", then may proceed with Cephalosporin use.   Reclast [zoledronic Acid] Other (See Comments)   Fever, Put in hospital, dr said it was a reaction from a reaction    Rofecoxib Swelling   Celebrex/vioxx REACTION: feet swelling   Shrimp Flavor Anaphylaxis   ALL SHELLFISH   Tetracycline Hcl Nausea And Vomiting   Xolair [omalizumab] Other (See Comments)   Caused Blood clot   Dilaudid [hydromorphone Hcl] Itching   Hydrocodone-acetaminophen Nausea And Vomiting   Levofloxacin Other (See Comments)   REACTION: GI upset   Oxycodone Hcl Nausea And Vomiting   Paroxetine Nausea And Vomiting   Paxil   Diltiazem Swelling   Tree Extract Other (See Comments)   "tested and told I was allergic to it; never experienced a reaction to it" (02/20/2012)      Medication List  acetaminophen 650 MG CR tablet Commonly known as:  TYLENOL Take 1,300 mg by mouth 2 (two) times daily.   AEROCHAMBER MV inhaler Use as instructed   albuterol 108 (90 Base) MCG/ACT inhaler Commonly known as:  PROAIR HFA Inhale 2 puffs into the lungs every 4 (four) hours as needed for wheezing or shortness of breath.   ALIGN 4 MG Caps Take 1 tablet by mouth daily.   Alpha-Lipoic Acid 600 MG Caps Take 600 mg by mouth daily.   atorvastatin 20 MG tablet Commonly known as:  LIPITOR Take 1 tablet (20  mg total) by mouth daily.   azelastine 0.1 % nasal spray Commonly known as:  ASTELIN USE 2 SPRAYS IN EACH NOSTRIL DAILY AS DIRECTED   BEANO PO Take 1 capsule by mouth 3 (three) times daily with meals.   CALCIUM CITRATE PLUS/MAGNESIUM PO Take 1 tablet by mouth 2 (two) times daily.   cetirizine 10 MG tablet Commonly known as:  ZYRTEC Take 5 mg by mouth every evening.   Colchicine 0.6 MG Caps TAKE 1 CAPSULE BY MOUTH DAILY AS NEEDED   CREON 24000-76000 units Cpep Generic drug:  Pancrelipase (Lip-Prot-Amyl) TAKE 1 CAPSULE BY MOUTH THREE TIMES DAILY (WITH MEALS)   cyproheptadine 4 MG tablet Commonly known as:  PERIACTIN TAKE 2 TABLETS BY MOUTH EVERY EVENING   denosumab 60 MG/ML Soln injection Commonly known as:  PROLIA Inject 60 mg into the skin every 6 (six) months. Administer in upper arm, thigh, or abdomen   dexlansoprazole 60 MG capsule Commonly known as:  DEXILANT Take 1 capsule (60 mg total) by mouth daily.   EPIPEN 2-PAK IJ Inject as directed.   FLUTTER Devi Use as directed   gabapentin 600 MG tablet Commonly known as:  NEURONTIN TAKE 1 TABLET BY MOUTH DAILY   GELUSIL 200-200-25 MG chewable tablet Generic drug:  alum & mag hydroxide-simeth Chew 1 tablet by mouth every 6 (six) hours as needed for flatulence.   guaifenesin 400 MG Tabs tablet Commonly known as:  HUMIBID E Take 400 mg by mouth 2 (two) times daily.   hydrocortisone 2.5 % rectal cream Commonly known as:  ANUSOL-HC APPLY AS NEEDED AS DIRECTED   LACTAID FAST ACT 9000 units Tabs Generic drug:  Lactase Take 18,000 Units by mouth 4 (four) times daily as needed (DAIRY).   lidocaine 5 % Commonly known as:  LIDODERM Place 1 patch onto the skin daily. Remove & Discard patch within 12 hours or as directed by MD   methimazole 5 MG tablet Commonly known as:  TAPAZOLE TAKE 1 TABLET BY MOUTH 3 TIMES DAILY   mirtazapine 30 MG disintegrating tablet Commonly known as:  REMERON SOL-TAB DISOLVE 1 TABLET  BY MOUTH DAILY AT BEDTIME   mometasone 50 MCG/ACT nasal spray Commonly known as:  NASONEX Place 2 sprays into the nose 2 (two) times daily.   mometasone-formoterol 200-5 MCG/ACT Aero Commonly known as:  DULERA Inhale 2 puffs into the lungs 2 (two) times daily.   montelukast 10 MG tablet Commonly known as:  SINGULAIR Take 1 tablet (10 mg total) by mouth daily.   mupirocin ointment 2 % Commonly known as:  BACTROBAN APPLY IN THE NOSE 3 TIMES DAILY AS NEEDED   nystatin 100000 UNIT/ML suspension Commonly known as:  MYCOSTATIN USE AS DIRECTED 5MLS IN MOUTH OR THROAT 4 TIMES DAILY   nystatin-triamcinolone ointment Commonly known as:  MYCOLOG Apply 1 application topically 3 (three) times daily as needed (YEAST INFECTION).   predniSONE 10 MG tablet  Commonly known as:  DELTASONE Take 40mg  po daily for 3 days, then take 30mg  po daily for 3 days, then take 20mg  po daily for two days, then take 10mg  po daily for 2 days   PREPLUS 27-1 MG Tabs TAKE 1 TABLET BY MOUTH DAILY   PRESCRIPTION MEDICATION Place 2 puffs into both ears daily as needed. Pt has compounded capsule (powder) that she uses In her ears as needed; CS (X) AHCL    CHLORAMP/SULFU/AMPHO B/ H DROC 100-100-5 mg capsule   ranitidine 300 MG tablet Commonly known as:  ZANTAC Take 1 tablet (300 mg total) by mouth daily.   SIMPLY SALINE NA Place 1 spray into both nostrils at bedtime.   SOOTHE OP Place 1 drop into both eyes 2 (two) times daily.   Tiotropium Bromide Monohydrate 2.5 MCG/ACT Aers Commonly known as:  SPIRIVA RESPIMAT Inhale 2 puffs into the lungs daily.   Vitamin D3 10000 units Tabs Take 1,000 Units by mouth daily.       Past Medical History:  Diagnosis Date  . Allergic rhinitis   . Allergy    SEASONAL  . Anemia   . Anxiety    pt denies  . Arthritis    "mostly the hands" (02/20/2012)  . Asthma   . Breast cancer ([place]) 1998   in remission  . Cataract    REMOVED  . Chronic lower back pain   .  Chronic neck pain   . Complication of anesthesia    "had hard time waking up from it several times" (02/20/2012)  . Depression    "some; don't take anything for it" (02/20/2012)  . Diverticulosis   . DVT (deep venous thrombosis) ([place])   . Exertional dyspnea   . Fibromyalgia 11/2011  . GERD (gastroesophageal reflux disease)   . Graves disease   . Headache(784.0)    "related to allergies; more at different times during the year" (02/20/2012)  . Hemorrhoids   . Hiatal hernia    back and neck  . Hx of adenomatous colonic polyps 04/12/2016  . Hypercholesteremia    good cholesterol is high  . Hypothyroidism   . IBS (irritable bowel syndrome)   . Moderate persistent asthma    -FeV1 72% 2011, -IgE 102 2011, CT sinus Neg 2011  . Osteoporosis    on reclast yearly  . Pneumonia 04/2011; ~ 11/2011   "double; single" (02/20/2012)    Past Surgical History:  Procedure Laterality Date  . ANTERIOR AND POSTERIOR REPAIR  1990's  . APPENDECTOMY    . BREAST LUMPECTOMY  1998   left  . CARPOMETACARPEL ([place]) FUSION OF THUMB WITH AUTOGRAFT FROM RADIUS  ~ 2009   "both thumbs" (02/20/2012)  . CATARACT EXTRACTION W/ INTRAOCULAR LENS  IMPLANT, BILATERAL  2012  . CERVICAL DISCECTOMY  10/2001   C5-C6  . CERVICAL FUSION  2003   C3-C4  . CHOLECYSTECTOMY    . COLONOSCOPY    . DEBRIDEMENT TENNIS ELBOW  ?1970's   right  . ESOPHAGOGASTRODUODENOSCOPY    . HYSTERECTOMY    . KNEE ARTHROPLASTY  ?1990's   "?right; w/cartilage repair" (02/20/2012)  . NASAL SEPTUM SURGERY  1980's  . POSTERIOR CERVICAL FUSION/FORAMINOTOMY  2004   "failed initial fusion; rewired  anterior neck" (02/20/2012)  . TONSILLECTOMY  ~ 1953  . VESICOVAGINAL FISTULA CLOSURE W/ TAH  1988  . VIDEO BRONCHOSCOPY Bilateral 08/23/2016   Procedure: VIDEO BRONCHOSCOPY WITH FLUORO;  Surgeon: Javier Glazier, MD;  Location: [facility];  Service: Cardiopulmonary;  Laterality: Bilateral;    Review of systems negative except as noted in HPI / PMHx or noted  below:  Review of Systems  Constitutional: Negative.   HENT: Negative.   Eyes: Negative.   Respiratory: Negative.   Cardiovascular: Negative.   Gastrointestinal: Negative.   Genitourinary: Negative.   Musculoskeletal: Negative.   Skin: Negative.   Neurological: Negative.   Endo/Heme/Allergies: Negative.   Psychiatric/Behavioral: Negative.      Objective:   Vitals:   07/16/17 1106  BP: 120/78  Pulse: 76  Resp: 20  Temp: 98.4 F (36.9 C)  SpO2: 96%          Physical Exam  HENT:  Head: Normocephalic.  Right Ear: Tympanic membrane, external ear and ear canal normal.  Left Ear: Tympanic membrane, external ear and ear canal normal.  Nose: Nose normal. No mucosal edema or rhinorrhea.  Mouth/Throat: Uvula is midline and mucous membranes are normal. Posterior oropharyngeal erythema present. No oropharyngeal exudate.  Eyes: Conjunctivae are normal.  Neck: Trachea normal. No tracheal tenderness present. No tracheal deviation present. No thyromegaly present.  Cardiovascular: Normal rate, regular rhythm, S1 normal, S2 normal and normal heart sounds.  No murmur heard. Pulmonary/Chest: Breath sounds normal. No stridor. No respiratory distress. She has no wheezes. She has no rales.  Musculoskeletal: She exhibits no edema.  Lymphadenopathy:       Head (right side): No tonsillar adenopathy present.       Head (left side): No tonsillar adenopathy present.    She has no cervical adenopathy.  Neurological: She is alert.  Skin: No rash noted. She is not diaphoretic. No erythema. Nails show no clubbing.    Diagnostics:    Spirometry was performed and demonstrated an FEV1 of 1.51 at 72 % of predicted.  The patient had an Asthma Control Test with the following results: ACT Total Score: 15.    Assessment and Plan:   1. Asthma, severe persistent, well-controlled   2. Pharyngitis due to other organism   3. Other allergic rhinitis   4. Facial pain syndrome   5. LPRD  (laryngopharyngeal reflux disease)     1. 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 Continue Periactin 4 mg tablet  2 tablets at bedtime  2. 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 Continue Dexilant 60 mg in the morning  B. Continue ranitidine 300 mg in the evening  4. Continue to treat inflammation:   A. Dulera 200 2 inhalations twice a day  B. Nasonex one spray each nostril 1-2 times a day   C. montelukast 10 mg daily  D. Spiriva respimat 2 inhalations one time per day  5. Continue to prevent thrush:   A. nystatin swish and spit twice a day  6. Continue Nucala and EpiPen as prescribed by Dr. Lake Bells  7. Use bronchodilator and Astelin and Zyrtec and nasal saline and nasal bactroban if needed  8.  For this recent episode use azithromycin 250 mg 1 time per day for 6 days.  Further treatment?  9. Return to clinic in 6 months or earlier if problem   Eleina appears to have developed an episode of pharyngitis.  She does have a fair amount of pain and redness in her throat and I will cover the possibility of streptococcal pharyngitis with azithromycin administration.  I am not really going to change any of her additional therapy which includes a large collection of anti-inflammatory medications for respiratory tract and treatment directed against reflux and  prevention for thrush as noted above.  She will keep in contact with me noting her response to this approach and I will see her back in this clinic in 6 months or earlier if there is a problem.   Allena Katz, MD Allergy / Immunology [place]

## 2017-07-17 ENCOUNTER — Encounter: Payer: Self-pay | Admitting: Allergy and Immunology

## 2017-07-22 ENCOUNTER — Encounter: Payer: Self-pay | Admitting: Family Medicine

## 2017-07-26 ENCOUNTER — Ambulatory Visit: Payer: Medicare Other | Admitting: Pulmonary Disease

## 2017-07-26 ENCOUNTER — Encounter: Payer: Self-pay | Admitting: Pulmonary Disease

## 2017-07-26 VITALS — BP 138/80 | HR 74 | Ht 61.0 in | Wt 137.0 lb

## 2017-07-26 DIAGNOSIS — J3089 Other allergic rhinitis: Secondary | ICD-10-CM

## 2017-07-26 DIAGNOSIS — R059 Cough, unspecified: Secondary | ICD-10-CM

## 2017-07-26 DIAGNOSIS — R0600 Dyspnea, unspecified: Secondary | ICD-10-CM | POA: Diagnosis not present

## 2017-07-26 DIAGNOSIS — R0602 Shortness of breath: Secondary | ICD-10-CM

## 2017-07-26 DIAGNOSIS — J4550 Severe persistent asthma, uncomplicated: Secondary | ICD-10-CM

## 2017-07-26 DIAGNOSIS — R942 Abnormal results of pulmonary function studies: Secondary | ICD-10-CM | POA: Diagnosis not present

## 2017-07-26 DIAGNOSIS — K219 Gastro-esophageal reflux disease without esophagitis: Secondary | ICD-10-CM

## 2017-07-26 DIAGNOSIS — R05 Cough: Secondary | ICD-10-CM

## 2017-07-26 DIAGNOSIS — J302 Other seasonal allergic rhinitis: Secondary | ICD-10-CM

## 2017-07-26 MED ORDER — VINATE M 27-1 MG PO TABS: 1.0000 | ORAL_TABLET | Freq: Every day | ORAL | 3 refills | Status: DC
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
## 2017-07-26 NOTE — Patient Instructions (Signed)
Severe persistent asthma: Continue Nucala for now Continue Dulera Continue Singulair Use albuterol as needed for chest tightness wheezing and shortness of breath  Dyspnea: This is the medical term for shortness of breath Because her lung function testing showed a decrease in the ability of oxygen to go from your lungs to the bloodstream and going to get a high-resolution CT scan of your chest as well as an echocardiogram to investigate this further  Sore throat: Continue taking your acid reflux treatment Rest your voice Recommend use hard candies and warm beverages.  Allergic rhinitis Continue nasonex  Follow up 2 months or sooner if needed

## 2017-07-28 ENCOUNTER — Encounter: Payer: Self-pay | Admitting: Pulmonary Disease

## 2017-07-29 NOTE — Telephone Encounter (Signed)
Received the following message from patient:    I am still having voice problems. I am drinking lots of warm liquids including decaff tea and honey, resting my voice and hard candy. No relief at this point. I ma coughing a lot and getting up some dis colored mucus a lot each day. My chest feels very heavy as well.   Do I need a new medication or just give it more time????  Thanks and have a very blessed day,  Ebony Scott   BQ, please advise. Thanks!

## 2017-08-01 ENCOUNTER — Encounter: Payer: Self-pay | Admitting: Allergy and Immunology

## 2017-08-01 ENCOUNTER — Ambulatory Visit: Payer: Medicare Other

## 2017-08-01 ENCOUNTER — Telehealth: Payer: Self-pay | Admitting: Allergy and Immunology

## 2017-08-01 DIAGNOSIS — J028 Acute pharyngitis due to other specified organisms: Secondary | ICD-10-CM

## 2017-08-01 NOTE — Telephone Encounter (Signed)
Please get a swab for strept in clinic

## 2017-08-01 NOTE — Telephone Encounter (Signed)
Spoke to patient states she having chest tightness, her chest feels raw, ear pain and sore throat. States she is the same like she was when she came in to see you on 07/16/17

## 2017-08-01 NOTE — Telephone Encounter (Signed)
Spoke to patient she is coming in today. Lab ordered and given to [facility]

## 2017-08-01 NOTE — Telephone Encounter (Signed)
Dr. Kozlow will you please advise?  °

## 2017-08-01 NOTE — Telephone Encounter (Signed)
Ebony Scott swabbed patient will send to lab

## 2017-08-01 NOTE — Telephone Encounter (Signed)
Patient was given antibiotic Patient is now experiencing hoarseness  What can she do?? She has completed meds - does she need a second round??

## 2017-08-01 NOTE — Telephone Encounter (Signed)
Please inform patient that if she no longer has a sore throat but just laryngitis we will wait for this to resolve.  Hopefully this will resolve over the course of the next 10 to 14 days.  If she still has a sore throat then we should get a swab for strep pharyngitis in the clinic.

## 2017-08-02 ENCOUNTER — Telehealth: Payer: Self-pay | Admitting: Allergy and Immunology

## 2017-08-02 NOTE — Telephone Encounter (Signed)
Pt called and would like a call as soon as the results of the strep test comes in

## 2017-08-04 LAB — RAPID STREP SCREEN (MED CTR MEBANE ONLY): Strep Gp A Ag, IA W/Reflex: NEGATIVE

## 2017-08-04 LAB — CULTURE, GROUP A STREP: Strep A Culture: NEGATIVE

## 2017-08-05 ENCOUNTER — Ambulatory Visit (INDEPENDENT_AMBULATORY_CARE_PROVIDER_SITE_OTHER)
Admission: RE | Admit: 2017-08-05 | Discharge: 2017-08-05 | Disposition: A | Discharge status: Home / Self Care | Payer: Medicare Other | Source: Ambulatory Visit | Attending: Pulmonary Disease | Admitting: Pulmonary Disease

## 2017-08-05 ENCOUNTER — Other Ambulatory Visit: Payer: Self-pay | Admitting: Family Medicine

## 2017-08-05 ENCOUNTER — Ambulatory Visit (HOSPITAL_COMMUNITY): Payer: Medicare Other | Attending: Pulmonary Disease

## 2017-08-05 ENCOUNTER — Ambulatory Visit: Payer: Medicare Other

## 2017-08-05 ENCOUNTER — Other Ambulatory Visit: Payer: Self-pay

## 2017-08-05 DIAGNOSIS — R942 Abnormal results of pulmonary function studies: Secondary | ICD-10-CM

## 2017-08-05 DIAGNOSIS — R0602 Shortness of breath: Secondary | ICD-10-CM | POA: Diagnosis not present

## 2017-08-05 MED ORDER — PERFLUTREN LIPID MICROSPHERE
1.0000 mL | INTRAVENOUS | Status: AC | PRN
  Administered 2017-08-05: 1 mL via INTRAVENOUS

## 2017-08-06 ENCOUNTER — Encounter: Payer: Self-pay | Admitting: Family Medicine

## 2017-08-10 DIAGNOSIS — R490 Dysphonia: Secondary | ICD-10-CM | POA: Insufficient documentation

## 2017-08-12 ENCOUNTER — Encounter: Payer: Self-pay | Admitting: Family Medicine

## 2017-08-12 ENCOUNTER — Telehealth: Payer: Self-pay | Admitting: *Deleted

## 2017-08-12 NOTE — Telephone Encounter (Signed)
I called pt back this afternoon.I asked SN, since he used to see Primary care pts.Ebony Scott pt who has an appt. Scheduled for Thurs. For her prolia shot, if she gets her nucala shot tomorrow is that enough time in between? "Yes"  Pt was on the phone so I told her SN said it would be fine. Nothing further needed.

## 2017-08-13 ENCOUNTER — Ambulatory Visit (INDEPENDENT_AMBULATORY_CARE_PROVIDER_SITE_OTHER): Payer: Medicare Other

## 2017-08-13 DIAGNOSIS — J4550 Severe persistent asthma, uncomplicated: Secondary | ICD-10-CM | POA: Diagnosis not present

## 2017-08-14 MED ORDER — MEPOLIZUMAB 100 MG ~~LOC~~ SOLR
100.0000 mg | Freq: Once | SUBCUTANEOUS | Status: AC
  Administered 2017-08-13: 100 mg via SUBCUTANEOUS

## 2017-08-15 ENCOUNTER — Ambulatory Visit: Payer: Medicare Other | Admitting: Family Medicine

## 2017-08-15 DIAGNOSIS — M810 Age-related osteoporosis without current pathological fracture: Secondary | ICD-10-CM | POA: Diagnosis not present

## 2017-08-15 MED ORDER — DENOSUMAB 60 MG/ML ~~LOC~~ SOSY
60.0000 mg | PREFILLED_SYRINGE | SUBCUTANEOUS | Status: DC
  Administered 2017-08-15: 60 mg via SUBCUTANEOUS

## 2017-08-16 ENCOUNTER — Ambulatory Visit: Payer: Medicare Other

## 2017-08-19 ENCOUNTER — Encounter: Payer: Self-pay | Admitting: Family Medicine

## 2017-08-19 ENCOUNTER — Ambulatory Visit: Payer: Medicare Other | Admitting: Family Medicine

## 2017-08-19 VITALS — BP 130/78 | HR 90 | Temp 98.2°F | Resp 16 | Ht 62.0 in | Wt 138.0 lb

## 2017-08-19 DIAGNOSIS — I2510 Atherosclerotic heart disease of native coronary artery without angina pectoris: Secondary | ICD-10-CM

## 2017-08-19 MED ORDER — ATORVASTATIN CALCIUM 40 MG PO TABS: 40.0000 mg | ORAL_TABLET | Freq: Every day | ORAL | 3 refills | Status: DC

## 2017-08-19 NOTE — Progress Notes (Signed)
Subjective:    Patient ID: Ebony Scott, female    DOB: [birth year], 67 y.o.   MRN: [id number]  HPI  Patient recently had a high-resolution CT scan performed of the chest for ongoing dyspnea on exertion ordered by her pulmonologist.  There was coincidental finding of coronary artery calcification.  The results are included below.  Patient denies any chest pain.  She does endorse some dyspnea on exertion.  She denies any orthopnea or paroxysmal nocturnal dyspnea.  IMPRESSION: 1. No findings to suggest interstitial lung disease. 2. Mild air trapping indicative of mild small airways disease. 3. Aortic atherosclerosis, in addition to left main and 2 vessel coronary artery disease. Please note that although the presence of coronary artery calcium documents the presence of coronary artery disease, the severity of this disease and any potential stenosis cannot be assessed on this non-gated CT examination. Assessment for potential risk factor modification, dietary therapy or pharmacologic therapy may be warranted, if clinically indicated. 4. Additional incidental findings, as above.    Past Medical History:  Diagnosis Date  . Allergic rhinitis   . Allergy    SEASONAL  . Anemia   . Anxiety    pt denies  . Arthritis    "mostly the hands" (02/20/2012)  . Asthma   . Breast cancer ([place]) 1998   in remission  . Cataract    REMOVED  . Chronic lower back pain   . Chronic neck pain   . Complication of anesthesia    "had hard time waking up from it several times" (02/20/2012)  . Depression    "some; don't take anything for it" (02/20/2012)  . Diverticulosis   . DVT (deep venous thrombosis) ([place])   . Exertional dyspnea   . Fibromyalgia 11/2011  . GERD (gastroesophageal reflux disease)   . Graves disease   . Headache(784.0)    "related to allergies; more at different times during the year" (02/20/2012)  . Hemorrhoids   . Hiatal hernia    back and neck  . Hx of adenomatous colonic polyps  04/12/2016  . Hypercholesteremia    good cholesterol is high  . Hypothyroidism   . IBS (irritable bowel syndrome)   . Moderate persistent asthma    -FeV1 72% 2011, -IgE 102 2011, CT sinus Neg 2011  . Osteoporosis    on reclast yearly  . Pneumonia 04/2011; ~ 11/2011   "double; single" (02/20/2012)   Past Surgical History:  Procedure Laterality Date  . ANTERIOR AND POSTERIOR REPAIR  1990's  . APPENDECTOMY    . BREAST LUMPECTOMY  1998   left  . CARPOMETACARPEL ([place]) FUSION OF THUMB WITH AUTOGRAFT FROM RADIUS  ~ 2009   "both thumbs" (02/20/2012)  . CATARACT EXTRACTION W/ INTRAOCULAR LENS  IMPLANT, BILATERAL  2012  . CERVICAL DISCECTOMY  10/2001   C5-C6  . CERVICAL FUSION  2003   C3-C4  . CHOLECYSTECTOMY    . COLONOSCOPY    . DEBRIDEMENT TENNIS ELBOW  ?1970's   right  . ESOPHAGOGASTRODUODENOSCOPY    . HYSTERECTOMY    . KNEE ARTHROPLASTY  ?1990's   "?right; w/cartilage repair" (02/20/2012)  . NASAL SEPTUM SURGERY  1980's  . POSTERIOR CERVICAL FUSION/FORAMINOTOMY  2004   "failed initial fusion; rewired  anterior neck" (02/20/2012)  . TONSILLECTOMY  ~ 1953  . VESICOVAGINAL FISTULA CLOSURE W/ TAH  1988  . VIDEO BRONCHOSCOPY Bilateral 08/23/2016   Procedure: VIDEO BRONCHOSCOPY WITH FLUORO;  Surgeon: Javier Glazier, MD;  Location: [facility];  Service:  Cardiopulmonary;  Laterality: Bilateral;    Current Outpatient Medications on File Prior to Visit  Medication Sig Dispense Refill  . acetaminophen (TYLENOL) 650 MG CR tablet Take 1,300 mg by mouth 2 (two) times daily.    Marland Kitchen albuterol (PROAIR HFA) 108 (90 Base) MCG/ACT inhaler Inhale 2 puffs into the lungs every 4 (four) hours as needed for wheezing or shortness of breath. 18 g 5  . Alpha-D-Galactosidase (BEANO PO) Take 1 capsule by mouth 3 (three) times daily with meals.    . Alpha-Lipoic Acid 600 MG CAPS Take 600 mg by mouth daily.     Marland Kitchen alum & mag hydroxide-simeth (GELUSIL) 200-200-25 MG chewable tablet Chew 1 tablet by mouth every 6  (six) hours as needed for flatulence.    Marland Kitchen atorvastatin (LIPITOR) 20 MG tablet Take 1 tablet (20 mg total) by mouth daily. 90 tablet 3  . azelastine (ASTELIN) 0.1 % nasal spray USE 2 SPRAYS IN EACH NOSTRIL DAILY AS DIRECTED 30 mL 5  . cetirizine (ZYRTEC) 10 MG tablet Take 5 mg by mouth every evening.     . Cholecalciferol (VITAMIN D3) 10000 units TABS Take 1,000 Units by mouth daily.    . Colchicine 0.6 MG CAPS TAKE 1 CAPSULE BY MOUTH DAILY AS NEEDED 30 capsule 2  . CREON 24000-76000 units CPEP TAKE 1 CAPSULE BY MOUTH THREE TIMES DAILY (WITH MEALS) 270 each 2  . cyproheptadine (PERIACTIN) 4 MG tablet TAKE 2 TABLETS BY MOUTH EVERY EVENING 60 tablet 5  . denosumab (PROLIA) 60 MG/ML SOLN injection Inject 60 mg into the skin every 6 (six) months. Administer in upper arm, thigh, or abdomen    . dexlansoprazole (DEXILANT) 60 MG capsule Take 1 capsule (60 mg total) by mouth daily. 30 capsule 5  . EPINEPHrine (EPIPEN 2-PAK IJ) Inject as directed.    . gabapentin (NEURONTIN) 600 MG tablet TAKE 1 TABLET BY MOUTH DAILY 90 tablet 3  . guaifenesin (HUMIBID E) 400 MG TABS tablet Take 400 mg by mouth 2 (two) times daily.    . hydrocortisone (ANUSOL-HC) 2.5 % rectal cream APPLY AS NEEDED AS DIRECTED 30 g 5  . Lactase (LACTAID FAST ACT) 9000 units TABS Take 18,000 Units by mouth 4 (four) times daily as needed (DAIRY).    Marland Kitchen lidocaine (LIDODERM) 5 % Place 1 patch onto the skin daily. Remove & Discard patch within 12 hours or as directed by MD 90 patch 3  . methimazole (TAPAZOLE) 5 MG tablet TAKE 1 TABLET BY MOUTH 3 TIMES DAILY 270 tablet 1  . mirtazapine (REMERON SOL-TAB) 30 MG disintegrating tablet DISOLVE 1 TABLET BY MOUTH DAILY AT BEDTIME 30 tablet 5  . mometasone (NASONEX) 50 MCG/ACT nasal spray Place 2 sprays into the nose 2 (two) times daily. 102 g 3  . mometasone-formoterol (DULERA) 200-5 MCG/ACT AERO Inhale 2 puffs into the lungs 2 (two) times daily. 1 Inhaler 5  . montelukast (SINGULAIR) 10 MG tablet Take  1 tablet (10 mg total) by mouth daily. 30 tablet 5  . Multiple Minerals-Vitamins (CALCIUM CITRATE PLUS/MAGNESIUM PO) Take 1 tablet by mouth 2 (two) times daily.    . mupirocin ointment (BACTROBAN) 2 % APPLY IN THE NOSE 3 TIMES DAILY AS NEEDED 22 g 2  . nystatin (MYCOSTATIN) 100000 UNIT/ML suspension USE AS DIRECTED 5MLS IN MOUTH OR THROAT 4 TIMES DAILY 473 mL 2  . nystatin-triamcinolone ointment (MYCOLOG) Apply 1 application topically 3 (three) times daily as needed (YEAST INFECTION).     . Prenatal Vit-Fe Fumarate-FA (PREPLUS)  27-1 MG TABS TAKE 1 TABLET BY MOUTH DAILY 30 tablet 3  . Prenatal Vit-Sel-Fe Fum-FA (VINATE M) 27-1 MG TABS Take 1 tablet by mouth daily. 90 each 3  . PRESCRIPTION MEDICATION Place 2 puffs into both ears daily as needed. Pt has compounded capsule (powder) that she uses In her ears as needed; CS (X) AHCL    CHLORAMP/SULFU/AMPHO B/ H DROC 100-100-5 mg capsule    . Probiotic Product (ALIGN) 4 MG CAPS Take 1 tablet by mouth daily.     Marland Kitchen Propylene Glycol-Glycerin (SOOTHE OP) Place 1 drop into both eyes 2 (two) times daily.     . ranitidine (ZANTAC) 300 MG tablet Take 1 tablet (300 mg total) by mouth daily. 30 tablet 5  . Respiratory Therapy Supplies (FLUTTER) DEVI Use as directed 1 each 0  . SIMPLY SALINE NA Place 1 spray into both nostrils at bedtime.    Marland Kitchen Spacer/Aero-Holding Chambers (AEROCHAMBER MV) inhaler Use as instructed 1 each 0  . Tiotropium Bromide Monohydrate (SPIRIVA RESPIMAT) 2.5 MCG/ACT AERS Inhale 2 puffs into the lungs daily. 1 Inhaler 5   Current Facility-Administered Medications on File Prior to Visit  Medication Dose Route Frequency Provider Last Rate Last Dose  . denosumab (PROLIA) injection 60 mg  60 mg Subcutaneous Q6 months [name], MD   60 mg at 08/15/17 1155  . Mepolizumab SOLR 100 mg  100 mg Subcutaneous Q28 days Simonne Maffucci B, MD   100 mg at 05/09/17 0900   Allergies  Allergen Reactions  . Dust Mite Extract Shortness Of Breath and  Other (See Comments)    "sneezing" (02/20/2012)  . Molds & Smuts Shortness Of Breath  . Morphine Sulfate Itching  . Other Shortness Of Breath and Other (See Comments)    Grass and weeds "sneezing; filled sinuses" (02/20/2012)  . Penicillins Rash and Other (See Comments)    "welts" (02/20/2012) Has patient had a PCN reaction causing immediate rash, facial/tongue/throat swelling, SOB or lightheadedness with hypotension: Unknown Has patient had a PCN reaction causing severe rash involving mucus membranes or skin necrosis: No Has patient had a PCN reaction that required hospitalization: No Has patient had a PCN reaction occurring within the last 10 years: No If all of the above answers are "NO", then may proceed with Cephalosporin use.   Marland Kitchen Reclast [Zoledronic Acid] Other (See Comments)    Fever, Put in hospital, dr said it was a reaction from a reaction   . Rofecoxib Swelling    Celebrex/vioxx REACTION: feet swelling  . Shrimp Flavor Anaphylaxis    ALL SHELLFISH  . Tetracycline Hcl Nausea And Vomiting  . Xolair [Omalizumab] Other (See Comments)    Caused Blood clot  . Dilaudid [Hydromorphone Hcl] Itching  . Hydrocodone-Acetaminophen Nausea And Vomiting  . Levofloxacin Other (See Comments)    REACTION: GI upset  . Oxycodone Hcl Nausea And Vomiting  . Paroxetine Nausea And Vomiting    Paxil   . Diltiazem Swelling  . Tree Extract Other (See Comments)    "tested and told I was allergic to it; never experienced a reaction to it" (02/20/2012)   Social History   Socioeconomic History  . Marital status: Divorced    Spouse name: Not on file  . Number of children: 2  . Years of education: college  . Highest education level: Not on file  Occupational History  . Occupation: Disabled    Comment: Retired Engineer, production: RETIRED  Social Needs  .  Financial resource strain: Not on file  . Food insecurity:    Worry: Not on file    Inability: Not on file  .  Transportation needs:    Medical: Not on file    Non-medical: Not on file  Tobacco Use  . Smoking status: Passive Smoke Exposure - Never Smoker  . Smokeless tobacco: Never Used  . Tobacco comment: Parents  Substance and Sexual Activity  . Alcohol use: Yes    Alcohol/week: 0.0 oz    Comment: 02/20/2012 "couple glasses of wine/6 months"  . Drug use: No  . Sexual activity: Never  Lifestyle  . Physical activity:    Days per week: Not on file    Minutes per session: Not on file  . Stress: Not on file  Relationships  . Social connections:    Talks on phone: Not on file    Gets together: Not on file    Attends religious service: Not on file    Active member of club or organization: Not on file    Attends meetings of clubs or organizations: Not on file    Relationship status: Not on file  . Intimate partner violence:    Fear of current or ex partner: Not on file    Emotionally abused: Not on file    Physically abused: Not on file    Forced sexual activity: Not on file  Other Topics Concern  . Not on file  Social History Narrative   Patient lives at home alone. Patient  divorced.    Patient has her BS degree.   Right handed.   Caffeine- sometimes coffee.      [facility]:   Born in [place], [place]. She worked as a Copywriter, advertising. She has no pets currently. She does have indoor plants. Previously had mold in her home that was remediated. Carpet was removed.             Review of Systems  All other systems reviewed and are negative.      Objective:   Physical Exam  Constitutional: She appears well-developed and well-nourished. No distress.  HENT:  Nose: Nose normal.  Mouth/Throat: Oropharynx is clear and moist. No oropharyngeal exudate.  Neck: Neck supple. No thyromegaly present.  Cardiovascular: Normal rate, regular rhythm and normal heart sounds.  Pulmonary/Chest: Effort normal. No respiratory distress. She has no wheezes. She has no rales.  Abdominal: Soft.  Bowel sounds are normal.  Musculoskeletal: She exhibits no edema.  Skin: She is not diaphoretic.  Vitals reviewed.         Assessment & Plan:  Coronary artery calcification seen on CAT scan - Plan: Ambulatory referral to Cardiology  Patient is otherwise asymptomatic.  I will consult cardiology given the calcification seen on her chest CT particularly in the left main, left circumflex, and LAD.  I recommended that she take aspirin 81 mg a day.  Also recommended that we intensify her statin and increase her Lipitor to 40 mg a day for plaque stabilization.  I also explained to the patient that we cannot categorize the severity of her coronary artery calcification on the CT scan and that most likely a stress test will be necessary to determine if these are flow-limiting lesions

## 2017-09-01 IMAGING — RF DG ESOPHAGUS
8 of 9 series · 20 of 24 positions shown · non-contrast
Comparison: 09/15/2013

CLINICAL DATA: Solid foods give sensation of sticking in the mid
esophagus, especially later within a meal.

EXAM:
ESOPHOGRAM / BARIUM SWALLOW / BARIUM TABLET STUDY
TECHNIQUE: Combined double contrast and single contrast examination performed
using effervescent crystals, thick barium liquid, and thin barium
liquid. The patient was observed with fluoroscopy swallowing a 13 mm
barium sulphate tablet.
FLUOROSCOPY TIME:  Radiation Exposure Index (as provided by the
fluoroscopic device): 168 microGy*m^2

[Series 1: cp_standard · 0.38mm/px · 2 of 90 frames shown (1 of 8)]
[frame 5/90]
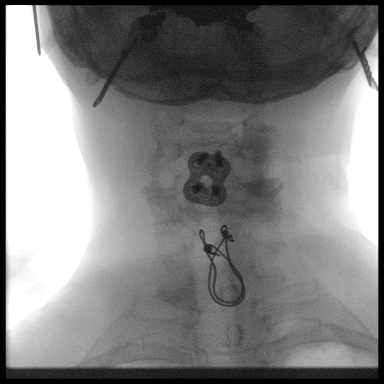
[frame 14/90]
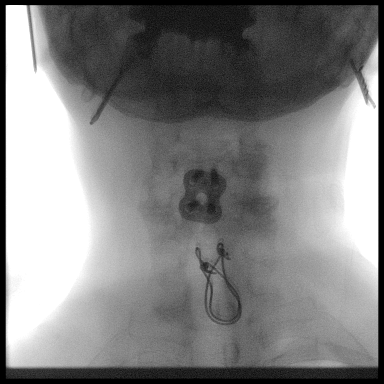

[Series 2: cp_standard · 0.38mm/px · 3 of 30 frames shown (2 of 8)]
[frame 5/30]
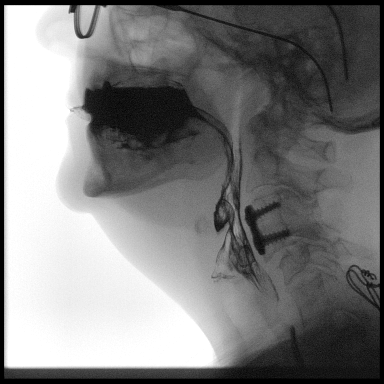
[frame 16/30]
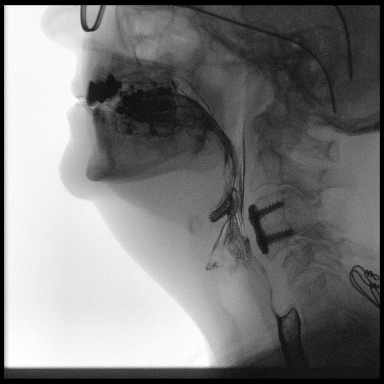
[frame 26/30]
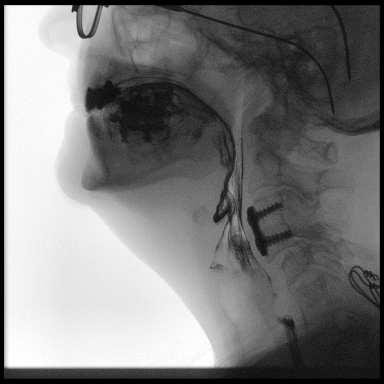

[Series 3: cp_standard · 0.37mm/px · 2 of 166 frames shown (3 of 8)]
[frame 15/166]
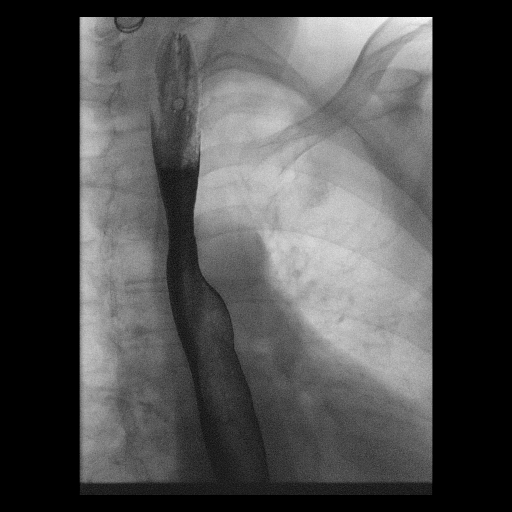
[frame 84/166]
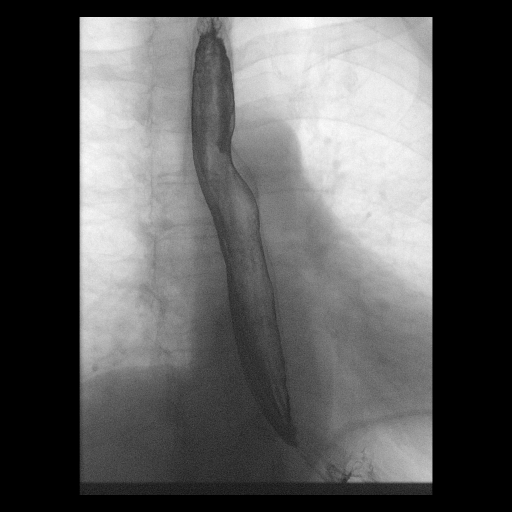

[Series 4: cp_standard · 0.38mm/px · 3 of 29 frames shown (4 of 8)]
[frame 15/29]
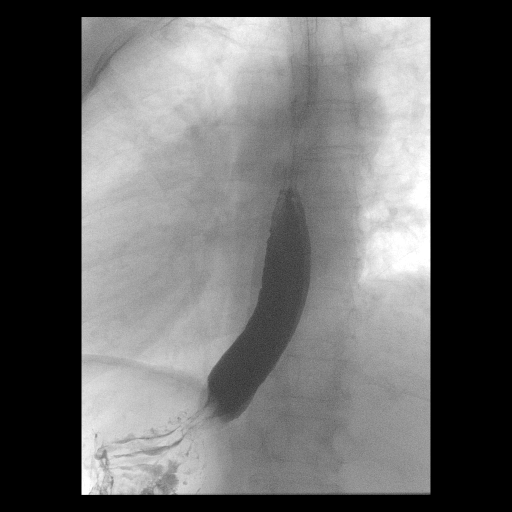
[frame 25/29]
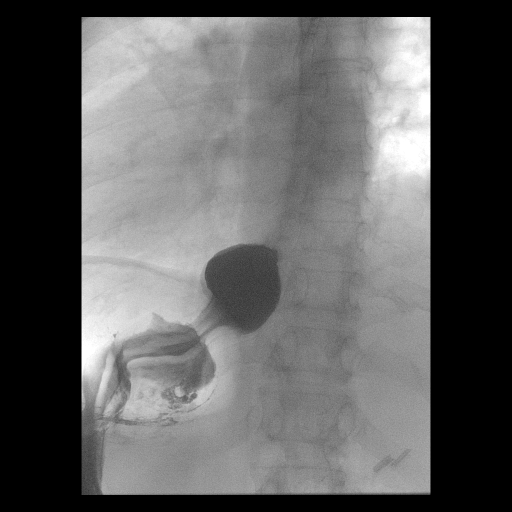
[frame 29/29]
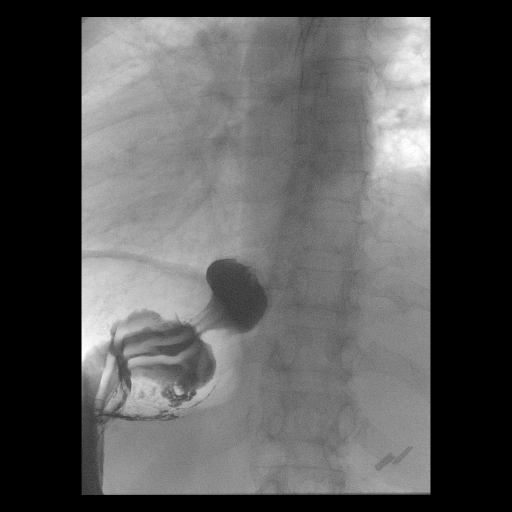

[Series 5: cp_standard · 0.38mm/px · 2 of 33 frames shown (5 of 8)]
[frame 5/33]
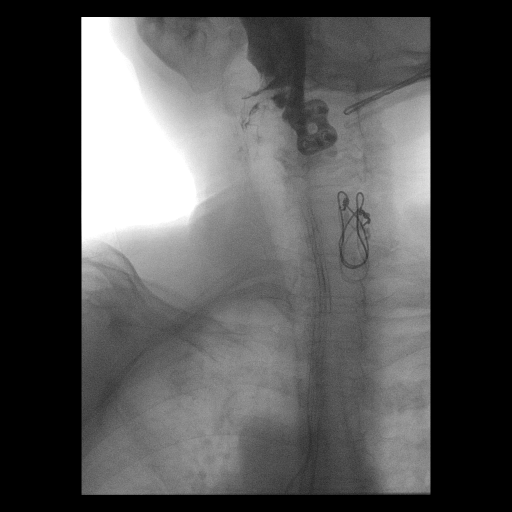
[frame 17/33]
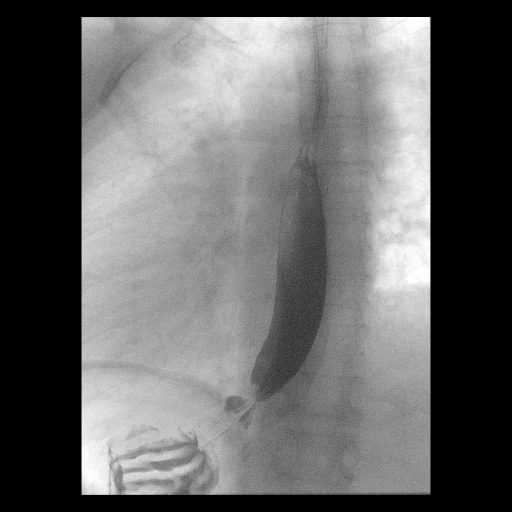

[Series 6: cp_standard · 0.38mm/px · 2 of 52 frames shown (6 of 8)]
[frame 27/52]
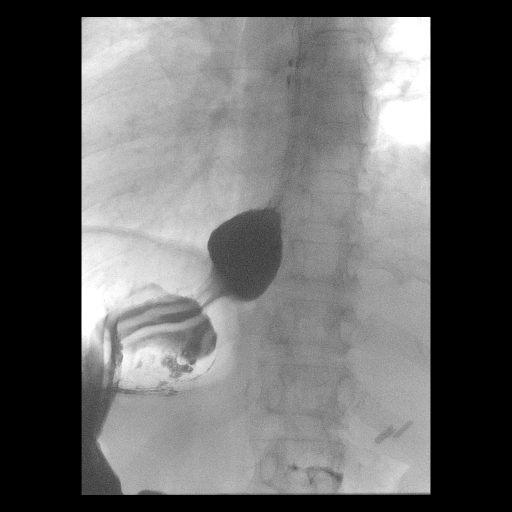
[frame 36/52]
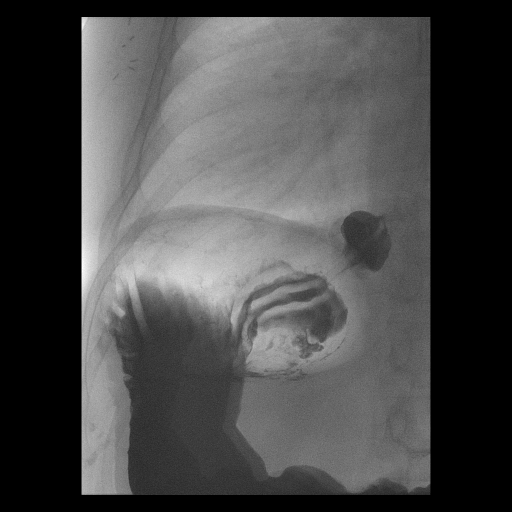

[Series 7: cp_standard · 0.38mm/px · 3 of 60 frames shown (7 of 8)]
[frame 8/60]
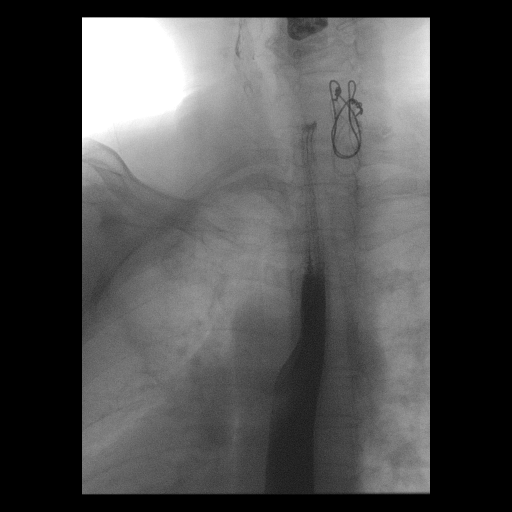
[frame 10/60]
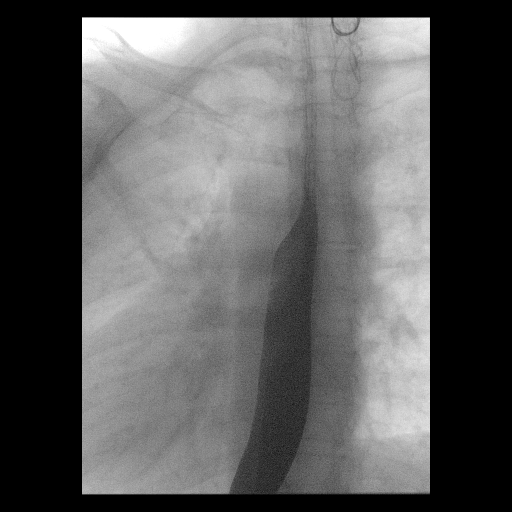
[frame 31/60]
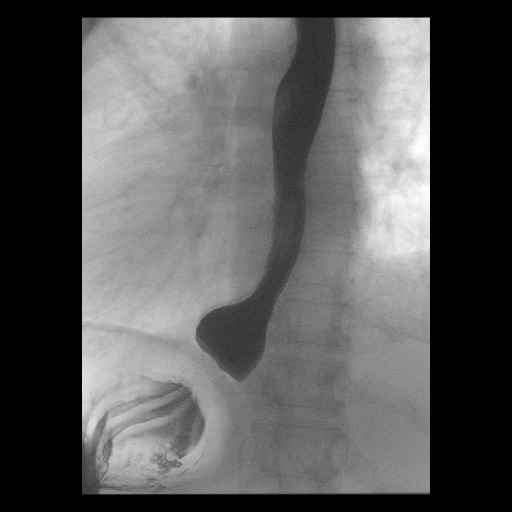

[Series 9: cp_standard · 0.40mm/px · 3 of 16 frames shown (8 of 8)]
[frame 3/16]
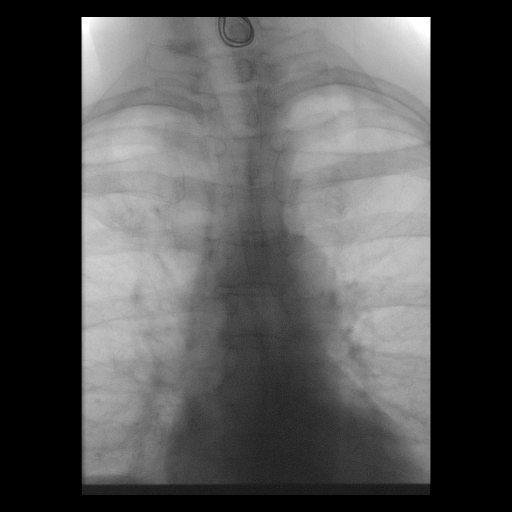
[frame 9/16]
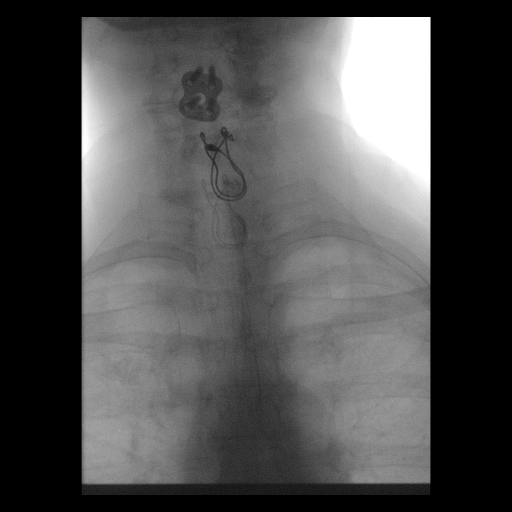
[frame 14/16]
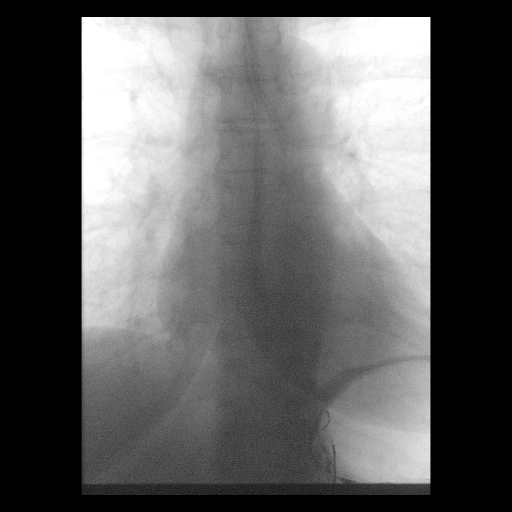

[20 of 24 positions shown; findings below may reference images not displayed]

FINDINGS: During the pharyngeal phase of swallowing, there is incomplete
epiglottic inversion with laryngeal penetration to the cords. No
frank tracheal aspiration is observed.

Anterior plate and screw fixation at C3-4. 4 mm of anterolisthesis
at C4-5. Interbody fusion at C5-6.

On double contrast esophagram, there is a small type 1 transient
hiatal hernia. No significant distal esophageal fold thickening,
esophageal ulceration, or significant esophageal mucosal
irregularity. No esophageal filling defect.

Esophageal caliber is normal. A 13 mm barium tablet passed without
difficulty into the stomach.

Primary peristaltic waves in the esophagus were normal on [DATE]
swallows.
IMPRESSION: 1. Small type 1 transient hiatal hernia.
2. Incomplete epiglottic inversion during swallowing, with laryngeal
penetration of thick liquid to the level of the vocal cords.
3. No stricture or dysmotility identified.
4. 4 mm anterolisthesis at C4-5.

## 2017-09-03 ENCOUNTER — Other Ambulatory Visit: Payer: Self-pay | Admitting: Family Medicine

## 2017-09-03 ENCOUNTER — Telehealth: Payer: Self-pay | Admitting: Pulmonary Disease

## 2017-09-03 NOTE — Telephone Encounter (Signed)
Patient called states she is returning Tammy Scott's call - She can be reached at [phone]

## 2017-09-03 NOTE — Telephone Encounter (Signed)
Called and lm with  mobile vm. I let her know she has a dose on hand for her next appt.. She won't need anymore until her Aug. Appt.. If I order it now it will be too soon. Will order nucala closer to Aug. Appt.. Nothing further needed.

## 2017-09-03 NOTE — Telephone Encounter (Signed)
Nothing further needed 

## 2017-09-03 NOTE — Telephone Encounter (Signed)
Pt called [facility] and told them I had left her a message telling her I have her dose for this month. I will  order her next inj. In Aug. Closer to that appt. I explained to the pt this is what I was told. I also told her as it was told to me that if the med was ordered too soon it would mess up the pt. Ins. (co-pays)

## 2017-09-04 ENCOUNTER — Ambulatory Visit: Payer: Medicare Other

## 2017-09-10 ENCOUNTER — Ambulatory Visit: Payer: Medicare Other

## 2017-09-12 ENCOUNTER — Ambulatory Visit (INDEPENDENT_AMBULATORY_CARE_PROVIDER_SITE_OTHER): Payer: Medicare Other

## 2017-09-12 DIAGNOSIS — J4550 Severe persistent asthma, uncomplicated: Secondary | ICD-10-CM

## 2017-09-17 MED ORDER — MEPOLIZUMAB 100 MG ~~LOC~~ SOLR
100.0000 mg | SUBCUTANEOUS | Status: DC
  Administered 2017-09-12: 100 mg via SUBCUTANEOUS

## 2017-09-17 NOTE — Progress Notes (Signed)
Documentation of medication administration and charges of Nucala have been completed by Desmond Dike, CMA based on the [facility] documentation sheet completed by Alroy Bailiff.

## 2017-09-20 ENCOUNTER — Telehealth: Payer: Self-pay | Admitting: Pulmonary Disease

## 2017-09-20 NOTE — Telephone Encounter (Signed)
Will route to TS to follow up on.

## 2017-09-20 NOTE — Telephone Encounter (Signed)
3 Vials Pt gets a 3 months supply. Order Date: 09/20/17 Shipping Date: 10/02/17

## 2017-09-24 NOTE — Telephone Encounter (Signed)
Arrival Date: 09/24/17 # of Vials: 3 (3 months supply) Lot #: [id number] Expiration Date: 02/2021

## 2017-09-26 ENCOUNTER — Ambulatory Visit (INDEPENDENT_AMBULATORY_CARE_PROVIDER_SITE_OTHER): Payer: Medicare Other | Admitting: Pulmonary Disease

## 2017-09-26 ENCOUNTER — Encounter: Payer: Self-pay | Admitting: Pulmonary Disease

## 2017-09-26 VITALS — BP 140/72 | HR 71 | Ht 61.0 in | Wt 138.0 lb

## 2017-09-26 DIAGNOSIS — K219 Gastro-esophageal reflux disease without esophagitis: Secondary | ICD-10-CM

## 2017-09-26 DIAGNOSIS — R05 Cough: Secondary | ICD-10-CM

## 2017-09-26 DIAGNOSIS — J4550 Severe persistent asthma, uncomplicated: Secondary | ICD-10-CM

## 2017-09-26 DIAGNOSIS — J3089 Other allergic rhinitis: Secondary | ICD-10-CM | POA: Diagnosis not present

## 2017-09-26 DIAGNOSIS — J302 Other seasonal allergic rhinitis: Secondary | ICD-10-CM

## 2017-09-26 DIAGNOSIS — R059 Cough, unspecified: Secondary | ICD-10-CM
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
## 2017-09-26 NOTE — Patient Instructions (Signed)
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

## 2017-09-30 ENCOUNTER — Other Ambulatory Visit: Payer: Self-pay | Admitting: Family Medicine

## 2017-10-07 NOTE — Telephone Encounter (Signed)
Redirect to [facility] who will be here 10/08/17 per elise who stated that he is here 10/08/17

## 2017-10-07 NOTE — Telephone Encounter (Signed)
Pt emailed BQ regarding upcoming appt on 10/10/17 Nucala injection this week. Pt reports rash on her face for the last week. Pt states she sent pictures for review, she has a rash that is red, raised on skin, itches and has worsened in last week. Pt has tried witch hazel, cortisone ointment, aloe gel and has washed with warm soapy water (white dial soap) and nothing is working at this time. Pt would like to know if she should keep her nucala injection this week, and what else to do about the rash?  BQ is out of the office today, routing message to DOD MR today.  MR please advise.

## 2017-10-09 ENCOUNTER — Telehealth: Payer: Self-pay | Admitting: Pulmonary Disease

## 2017-10-09 NOTE — Telephone Encounter (Signed)
Hold until next week

## 2017-10-09 NOTE — Telephone Encounter (Signed)
Spoke with pt, she has had a rash on her face for a week. She is scheduled for a Nucala shot tomorrow at 38: 62 but didn't know if she should take it  She has left a mychart message as well but MR wanted BQ to address when he got back. BQ please advise.   She has tried Witch hazel, cortisone, and aloe vera  Patient Instructions by Juanito Doom, MD at 09/26/2017 10:00 AM  Author: Juanito Doom, MD Author Type: Physician Filed: 09/26/2017 10:35 AM  Note Status: Signed Cosign: Cosign Not Required Encounter Date: 09/26/2017  Editor: Juanito Doom, MD (Physician)    Severe persistent asthma: Again, I am not convinced you have this When you come back in September if the cardiologist finds no evidence of a significant underlying cardiac problem we are going to stop Nucala In October, we will plan on stopping Dulera  Coronary calcification: Follow-up with cardiology  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

## 2017-10-09 NOTE — Telephone Encounter (Signed)
Called pt and advised message from the provider. Pt understood and verbalized understanding. Nothing further is needed.   Cancelled tomorrows appt for pt.

## 2017-10-10 ENCOUNTER — Ambulatory Visit: Payer: Medicare Other

## 2017-10-14 NOTE — Progress Notes (Signed)
Cardiology Office Note:    Date:  10/15/2017   ID:  Ebony Scott, DOB November 30, 1950, MRN [id number]  PCP:  [name], MD  Cardiologist:  Buford Dresser, MD PhD  Referring MD: [name], MD   Chief Complaint  Patient presents with  . New Patient (Initial Visit)    History of Present Illness:    Ebony Scott is a 67 y.o. female with a hx of asthma and other medical conditions listed below who is seen as a new consult at the request of Dr. Dennard Schaumann for evaluation and management of incidental coronary artery calcification seen on CT scan.  Patient concerns: shortness of breath (?asthma vs vocal cords vs heart, seeing pulmonary)--especially with bending over, concern re: issue with relaxation of her heart, calcium on CT scan.  Personal risk factors: Denies any history of MI, stroke, heart failure, PAD/PVD, diabetes. She denies any chest pain ever.   Patient thinks she had a stress test in the past just because of her age, though she is unsure of the indication. She saw Loralie Champagne in the past (not for heart failure, just because of location at [facility]). Per review of the notes, she was experiencing fatigue and shortness of breath. Attempted stress echo, but she was unable to reach her target heart rate. Lexiscan myocardial perfusion study done after was normal.  Has occasional leg cramps at night, but no claudication. Is very active at home, can climb flights of stairs 10-20 times/day. Vacuums and cleans her entire house. No chest pain. Does have shortness of breath. Two years ago she was walking 2 miles/day but since her throat issue hasn't been able to do that. She continues to have a cough productive of mucous, yellow in color. Coughs frequently throughout the day.  Father had 4V CABG and valve replacement at age 82; had a complication with ruptured aorta after surgery and passed away. Mother had lung cancer and diabetes. No other significant heart disease or risks.     Past Medical History:  Diagnosis Date  . Allergic rhinitis   . Allergy    SEASONAL  . Anemia   . Anxiety    pt denies  . Arthritis    "mostly the hands" (02/20/2012)  . Asthma   . Breast cancer ([place]) 1998   in remission  . Cataract    REMOVED  . Chronic lower back pain   . Chronic neck pain   . Complication of anesthesia    "had hard time waking up from it several times" (02/20/2012)  . Depression    "some; don't take anything for it" (02/20/2012)  . Diverticulosis   . DVT (deep venous thrombosis) ([place])   . Exertional dyspnea   . Fibromyalgia 11/2011  . GERD (gastroesophageal reflux disease)   . Graves disease   . Headache(784.0)    "related to allergies; more at different times during the year" (02/20/2012)  . Hemorrhoids   . Hiatal hernia    back and neck  . Hx of adenomatous colonic polyps 04/12/2016  . Hypercholesteremia    good cholesterol is high  . Hypothyroidism   . IBS (irritable bowel syndrome)   . Moderate persistent asthma    -FeV1 72% 2011, -IgE 102 2011, CT sinus Neg 2011  . Osteoporosis    on reclast yearly  . Pneumonia 04/2011; ~ 11/2011   "double; single" (02/20/2012)    Past Surgical History:  Procedure Laterality Date  . ANTERIOR AND POSTERIOR REPAIR  1990's  .  APPENDECTOMY    . BREAST LUMPECTOMY  1998   left  . CARPOMETACARPEL ([place]) FUSION OF THUMB WITH AUTOGRAFT FROM RADIUS  ~ 2009   "both thumbs" (02/20/2012)  . CATARACT EXTRACTION W/ INTRAOCULAR LENS  IMPLANT, BILATERAL  2012  . CERVICAL DISCECTOMY  10/2001   C5-C6  . CERVICAL FUSION  2003   C3-C4  . CHOLECYSTECTOMY    . COLONOSCOPY    . DEBRIDEMENT TENNIS ELBOW  ?1970's   right  . ESOPHAGOGASTRODUODENOSCOPY    . HYSTERECTOMY    . KNEE ARTHROPLASTY  ?1990's   "?right; w/cartilage repair" (02/20/2012)  . NASAL SEPTUM SURGERY  1980's  . POSTERIOR CERVICAL FUSION/FORAMINOTOMY  2004   "failed initial fusion; rewired  anterior neck" (02/20/2012)  . TONSILLECTOMY  ~ 1953  . VESICOVAGINAL  FISTULA CLOSURE W/ TAH  1988  . VIDEO BRONCHOSCOPY Bilateral 08/23/2016   Procedure: VIDEO BRONCHOSCOPY WITH FLUORO;  Surgeon: Javier Glazier, MD;  Location: [facility];  Service: Cardiopulmonary;  Laterality: Bilateral;    Current Medications: Current Outpatient Medications on File Prior to Visit  Medication Sig  . acetaminophen (TYLENOL) 650 MG CR tablet Take 1,300 mg by mouth 2 (two) times daily.  Marland Kitchen albuterol (PROAIR HFA) 108 (90 Base) MCG/ACT inhaler Inhale 2 puffs into the lungs every 4 (four) hours as needed for wheezing or shortness of breath.  . Alpha-D-Galactosidase (BEANO PO) Take 1 capsule by mouth 3 (three) times daily with meals.  . Alpha-Lipoic Acid 600 MG CAPS Take 600 mg by mouth daily.   Marland Kitchen alum & mag hydroxide-simeth (GELUSIL) 200-200-25 MG chewable tablet Chew 1 tablet by mouth every 6 (six) hours as needed for flatulence.  Marland Kitchen atorvastatin (LIPITOR) 40 MG tablet Take 1 tablet (40 mg total) by mouth daily.  Marland Kitchen azelastine (ASTELIN) 0.1 % nasal spray USE 2 SPRAYS IN EACH NOSTRIL DAILY AS DIRECTED  . calcium citrate-vitamin D (CITRACAL+D) 315-200 MG-UNIT tablet Take 1 tablet by mouth 2 (two) times daily.  . cetirizine (ZYRTEC) 10 MG tablet Take 5 mg by mouth every evening.   . Cholecalciferol (VITAMIN D3) 10000 units TABS Take 1,000 Units by mouth daily.  . Colchicine 0.6 MG CAPS TAKE 1 CAPSULE BY MOUTH DAILY AS NEEDED  . CREON 24000-76000 units CPEP TAKE 1 CAPSULE BY MOUTH THREE TIMES DAILY (WITH MEALS)  . cyproheptadine (PERIACTIN) 4 MG tablet TAKE 2 TABLETS BY MOUTH EVERY EVENING  . denosumab (PROLIA) 60 MG/ML SOLN injection Inject 60 mg into the skin every 6 (six) months. Administer in upper arm, thigh, or abdomen  . dexlansoprazole (DEXILANT) 60 MG capsule Take 1 capsule (60 mg total) by mouth daily.  Marland Kitchen EPINEPHrine (EPIPEN 2-PAK IJ) Inject as directed.  . gabapentin (NEURONTIN) 600 MG tablet TAKE 1 TABLET BY MOUTH DAILY  . guaifenesin (HUMIBID E) 400 MG TABS tablet Take  400 mg by mouth 2 (two) times daily.  . hydrocortisone (ANUSOL-HC) 2.5 % rectal cream APPLY AS NEEDED AS DIRECTED  . IRON PO TAKE 1 TABLET BY MOUTH ONCE DAILY  . Lactase (LACTAID FAST ACT) 9000 units TABS Take 18,000 Units by mouth 4 (four) times daily as needed (DAIRY).  Marland Kitchen lidocaine (LIDODERM) 5 % Place 1 patch onto the skin daily. Remove & Discard patch within 12 hours or as directed by MD  . methimazole (TAPAZOLE) 5 MG tablet TAKE 1 TABLET BY MOUTH 3 TIMES DAILY  . methimazole (TAPAZOLE) 5 MG tablet TAKE 1 TABLET BY MOUTH 3 TIMES DAILY  . mirtazapine (REMERON SOL-TAB) 30 MG  disintegrating tablet DISOLVE 1 TABLET BY MOUTH DAILY AT BEDTIME  . mometasone (NASONEX) 50 MCG/ACT nasal spray Place 2 sprays into the nose 2 (two) times daily.  . mometasone-formoterol (DULERA) 200-5 MCG/ACT AERO Inhale 2 puffs into the lungs 2 (two) times daily.  . montelukast (SINGULAIR) 10 MG tablet Take 1 tablet (10 mg total) by mouth daily.  . mupirocin ointment (BACTROBAN) 2 % APPLY IN THE NOSE 3 TIMES DAILY AS NEEDED  . nystatin (MYCOSTATIN) 100000 UNIT/ML suspension USE AS DIRECTED 5MLS IN MOUTH OR THROAT 4 TIMES DAILY  . nystatin-triamcinolone ointment (MYCOLOG) Apply 1 application topically 3 (three) times daily as needed (YEAST INFECTION).   . Prenatal Vit-Sel-Fe Fum-FA (VINATE M) 27-1 MG TABS Take 1 tablet by mouth daily.  Marland Kitchen PRESCRIPTION MEDICATION Place 2 puffs into both ears daily as needed. Pt has compounded capsule (powder) that she uses In her ears as needed; CS (X) AHCL    CHLORAMP/SULFU/AMPHO B/ H DROC 100-100-5 mg capsule  . Probiotic Product (ALIGN) 4 MG CAPS Take 1 tablet by mouth daily.   Marland Kitchen Propylene Glycol-Glycerin (SOOTHE OP) Place 1 drop into both eyes 2 (two) times daily.   . ranitidine (ZANTAC) 300 MG tablet Take 1 tablet (300 mg total) by mouth daily.  Marland Kitchen Respiratory Therapy Supplies (FLUTTER) DEVI Use as directed  . SIMPLY SALINE NA Place 1 spray into both nostrils at bedtime.  Marland Kitchen  Spacer/Aero-Holding Chambers (AEROCHAMBER MV) inhaler Use as instructed  . Tiotropium Bromide Monohydrate (SPIRIVA RESPIMAT) 2.5 MCG/ACT AERS Inhale 2 puffs into the lungs daily.   Current Facility-Administered Medications on File Prior to Visit  Medication  . denosumab (PROLIA) injection 60 mg  . Mepolizumab SOLR 100 mg  . Mepolizumab SOLR 100 mg     Allergies:   Dust mite extract; Molds & smuts; Morphine sulfate; Other; Penicillins; Reclast [zoledronic acid]; Rofecoxib; Shrimp flavor; Tetracycline hcl; Xolair [omalizumab]; Dilaudid [hydromorphone hcl]; Hydrocodone-acetaminophen; Levofloxacin; Oxycodone hcl; Paroxetine; Diltiazem; and Tree extract   Social History   Socioeconomic History  . Marital status: Divorced    Spouse name: Not on file  . Number of children: 2  . Years of education: college  . Highest education level: Not on file  Occupational History  . Occupation: Disabled    Comment: Retired Engineer, production: RETIRED  Social Needs  . Financial resource strain: Not on file  . Food insecurity:    Worry: Not on file    Inability: Not on file  . Transportation needs:    Medical: Not on file    Non-medical: Not on file  Tobacco Use  . Smoking status: Passive Smoke Exposure - Never Smoker  . Smokeless tobacco: Never Used  . Tobacco comment: Parents  Substance and Sexual Activity  . Alcohol use: Yes    Alcohol/week: 0.0 standard drinks    Comment: 02/20/2012 "couple glasses of wine/6 months"  . Drug use: No  . Sexual activity: Never  Lifestyle  . Physical activity:    Days per week: Not on file    Minutes per session: Not on file  . Stress: Not on file  Relationships  . Social connections:    Talks on phone: Not on file    Gets together: Not on file    Attends religious service: Not on file    Active member of club or organization: Not on file    Attends meetings of clubs or organizations: Not on file    Relationship status: Not on  file  Other Topics Concern  . Not on file  Social History Narrative   Patient lives at home alone. Patient  divorced.    Patient has her BS degree.   Right handed.   Caffeine- sometimes coffee.      [facility]:   Born in [place], [place]. She worked as a Copywriter, advertising. She has no pets currently. She does have indoor plants. Previously had mold in her home that was remediated. Carpet was removed.            Family History: The patient's family history includes Allergies in her father and mother; Arthritis in her father and mother; Colitis in her daughter; Colon cancer in her unknown relative; Diabetes in her maternal grandfather and mother; Heart disease in her father and mother; Lung cancer in her mother; Stroke in her father. Father had 4V CABG and valve replacement at age 39; had a complication with ruptured aorta after surgery and passed away. Mother had lung cancer and diabetes. No other significant heart disease or risks.   ROS:   Please see the history of present illness.  Additional pertinent ROS: Review of Systems  Constitutional: Negative for chills, diaphoresis and fever.  HENT: Positive for hearing loss. Negative for ear pain.   Eyes: Positive for blurred vision. Negative for pain.  Respiratory: Positive for cough and shortness of breath.   Cardiovascular: Negative for chest pain, palpitations, orthopnea, claudication, leg swelling and PND.  Gastrointestinal: Negative for abdominal pain, blood in stool and melena.  Genitourinary: Negative for dysuria and hematuria.  Musculoskeletal: Negative for falls and myalgias.  Skin: Negative for rash.  Neurological: Negative for focal weakness and loss of consciousness.  Endo/Heme/Allergies: Positive for environmental allergies. Does not bruise/bleed easily.    EKGs/Labs/Other Studies Reviewed:    The following studies were reviewed today: Echo 08/05/17 Study Conclusions  - Left ventricle: The cavity size was normal.  There was mild focal   basal hypertrophy of the septum. Systolic function was vigorous.   The estimated ejection fraction was in the range of 65% to 70%.   Wall motion was normal; there were no regional wall motion   abnormalities. Doppler parameters are consistent with abnormal   left ventricular relaxation (grade 1 diastolic dysfunction).  Impressions:  - Definity used; vigorous LV systolic function; mild diastolic   dysfunction.  CT chest high resolution 6.17.19 FINDINGS: Cardiovascular: Heart size is normal. There is no significant pericardial fluid, thickening or pericardial calcification. There is aortic atherosclerosis, as well as atherosclerosis of the great vessels of the mediastinum and the coronary arteries, including calcified atherosclerotic plaque in the left main, left anterior descending left circumflex coronary arteries.  IMPRESSION: 1. No findings to suggest interstitial lung disease. 2. Mild air trapping indicative of mild small airways disease. 3. Aortic atherosclerosis, in addition to left main and 2 vessel coronary artery disease. Please note that although the presence of coronary artery calcium documents the presence of coronary artery disease, the severity of this disease and any potential stenosis cannot be assessed on this non-gated CT examination. Assessment for potential risk factor modification, dietary therapy or pharmacologic therapy may be warranted, if clinically indicated. 4. Additional incidental findings, as above.  Stress 2011:  Stress echo 11/2009 Study Conclusions - Stress ECG conclusions: There were no stress arrhythmias or conduction abnormalities. The stress ECG was normal. - Staged echo: There was no echocardiographic evidence for stress-induced ischemia. Impressions: - Nondiagnostic stress echocardiogram with nochest pain, no ST changes; patient did not  achieve target heart rate; no stress-induced wall motion  abnormalities.  Had [place] 12/2009 due to nondiagnostic stress echo: Stress Procedure  The patient received IV Lexiscan 0.4 mg over 15-seconds with concurrent low level exercise and then Technetium 65mTetrofosmin was injected at 30-seconds while the patient continued walking one more minute.  There were no significant changes with Lexiscan.  Quantitative spect images were obtained after a 45 minute delay.  QPS Raw Data Images:  Normal; no motion artifact; normal heart/lung ratio. Stress Images:  Normal homogeneous uptake in all areas of the myocardium. Rest Images:  Normal homogeneous uptake in all areas of the myocardium. Subtraction (SDS):  No evidence of ischemia. Transient Ischemic Dilatation:  1.15  (Normal <1.22)  Lung/Heart Ratio:  .27  (Normal <0.45)  Quantitative Gated Spect Images QGS EDV:  52 ml QGS ESV:  13 ml QGS EF:  75 % QGS cine images:  Normal motion  Findings Normal nuclear study  EKG:  EKG is ordered today.  The ekg ordered today demonstrates normal sinus rhythm, pattern of prior septal infarct  Recent Labs: 02/20/2017: Hemoglobin 12.8; Platelets 230 05/27/2017: ALT 44; BUN 26; Creat 1.18; Potassium 4.2; Sodium 142; TSH 3.00  Recent Lipid Panel    Component Value Date/Time   CHOL 194 05/27/2017 0822   TRIG 156 (H) 05/27/2017 0822   HDL 76 05/27/2017 0822   CHOLHDL 2.6 05/27/2017 0822   VLDL 27 08/20/2016 1103   LDLCALC 92 05/27/2017 0822    Physical Exam:    VS:  BP 136/78   Pulse 72   Ht _0  (1.549 m)   Wt 138 lb 3.2 oz (62.7 kg)   BMI 26.11 kg/m     Wt Readings from Last 3 Encounters:  10/15/17 138 lb 3.2 oz (62.7 kg)  09/26/17 138 lb (62.6 kg)  08/19/17 138 lb (62.6 kg)     GEN: Well nourished, well developed in no acute distress. Slightly hoarse voice when speaking. HEENT: Normal NECK: No JVD; No carotid bruits LYMPHATICS: No lymphadenopathy CARDIAC: regular rhythm, normal S1 and S2, no murmurs, rubs, gallops. Radial and DP pulses  2+ bilaterally. RESPIRATORY:  Clear to auscultation without rales, wheezing or rhonchi  ABDOMEN: Soft, non-tender, non-distended MUSCULOSKELETAL:  No edema; No deformity  SKIN: Warm and dry NEUROLOGIC:  Alert and oriented x 3 PSYCHIATRIC:  Normal affect   ASSESSMENT:    1. SOB (shortness of breath)   2. Chronic diastolic CHF (congestive heart failure) ([place]   3. Coronary artery calcification seen on CT scan   4. Counseling on health promotion and disease prevention    PLAN:    1. Coronary calcium seen on CT scan, cardiovascular prevention: Coronary calcium seen on CT scan. Patient without symptoms of chest pain; shortness of breath (below) is multfactorial. She has had prior negative nuclear stress test, and her recent echo does not show any wall motion abnormalities. ASCVD risk score is below. No diabetes history.  We discussed statins for prevention, and she was recently adjusted from pravastatin to atorvastatin 40 mg. She is pending a repeat lipid panel for this. We discussed the current recommendations for aspirin and the fact that they are currently in flux. She currently prefers not to be on aspirin at this time.  -continue atorvastatin -no aspirin per patient preference -Lipids from April were on pravastatin. She is due for fasting lipid repeat In October. The 10-year ASCVD risk score (Mikey BussingDC Jr., et al., 2013) is: 8.8%   Values used to calculate  the score:     Age: 65 years     Sex: Female     Is Non-Hispanic African American: No     Diabetic: No     Tobacco smoker: No     Systolic Blood Pressure: 295 mmHg     Is BP treated: No     HDL Cholesterol: 76 mg/dL     Total Cholesterol: 194 mg/dL   2. Shortness of breath: she is still able to be very active, climbing multiple flights of stairs and doing significant housework, but she is not walking the 2 miles/day that she was previously. She chronologically relates her symptoms to the throat infection she has been dealing with.  She sees Dr. Lake Bells in pulmonology for possible severe persistent asthma (prior diagnosis) versus other etiology and is following with Dr. Joya Gaskins for dysphonia and laryngeal candidiasis.   I do not think that her coronary artery calcification is causing her symptoms. She has some dyspnea with exertion reported but is able to be extremely active when needed without limiting symptoms. Her HDL is excellent, and she was recently increased to a high intensity statin. Her echo is largely unremarkable except for some mild diastolic dysfunction.   She chronologically relates all of her worsening symptoms to her throat infection. Having just met her, I am not sure of her prior baseline, but she appears very functional overall.   If her speech therapy and treatment does not improve her breathing, it would be reasonable to stress her. We did discuss the likelihood of a false positive stress test, which is not inconsequential for her.   Not a good candidate for coronary CTA given the known calcifications. Would ideally like to exercise her to see what her tolerance is, but she has already stated that she had a bad experience on the treadmill and would only do a chemical stress test. She did not require aminophylline before and she understands the risk of exacerbation of her asthma if it is required.  Plan for follow up: Follow up in 2 mos. If symptoms not improving, will discuss chemical stress test.   Medication Adjustments/Labs and Tests Ordered: Current medicines are reviewed at length with the patient today.  Concerns regarding medicines are outlined above.  Orders Placed This Encounter  Procedures  . EKG 12-Lead   No orders of the defined types were placed in this encounter.   Patient Instructions  Medication Instructions: Your physician recommends that you continue on your current medications as directed.    If you need a refill on your cardiac medications before your next appointment, please  call your pharmacy.   Labwork: None  Procedures/Testing: None  Follow-Up: Your physician wants you to follow-up in 2 months with Dr. Harrell Gave.    Special Instructions:    Thank you for choosing Heartcare at [facility]!!       Signed, Buford Dresser, MD PhD 10/15/2017 2:07 PM    [place]

## 2017-10-15 ENCOUNTER — Encounter: Payer: Self-pay | Admitting: Cardiology

## 2017-10-15 ENCOUNTER — Ambulatory Visit: Payer: Medicare Other | Admitting: Cardiology

## 2017-10-15 VITALS — BP 136/78 | HR 72 | Ht 61.0 in | Wt 138.2 lb

## 2017-10-15 DIAGNOSIS — I5032 Chronic diastolic (congestive) heart failure: Secondary | ICD-10-CM

## 2017-10-15 DIAGNOSIS — I2510 Atherosclerotic heart disease of native coronary artery without angina pectoris: Secondary | ICD-10-CM | POA: Diagnosis not present

## 2017-10-15 DIAGNOSIS — R0602 Shortness of breath: Secondary | ICD-10-CM

## 2017-10-15 DIAGNOSIS — Z7189 Other specified counseling: Secondary | ICD-10-CM | POA: Diagnosis not present

## 2017-10-15 NOTE — Patient Instructions (Signed)
Medication Instructions: Your physician recommends that you continue on your current medications as directed.    If you need a refill on your cardiac medications before your next appointment, please call your pharmacy.   Labwork: None  Procedures/Testing: None  Follow-Up: Your physician wants you to follow-up in 2 months with Dr. Harrell Gave.    Special Instructions:    Thank you for choosing Heartcare at [facility]!!

## 2017-10-18 ENCOUNTER — Ambulatory Visit (INDEPENDENT_AMBULATORY_CARE_PROVIDER_SITE_OTHER): Payer: Medicare Other

## 2017-10-18 DIAGNOSIS — J4550 Severe persistent asthma, uncomplicated: Secondary | ICD-10-CM | POA: Diagnosis not present

## 2017-10-18 MED ORDER — MEPOLIZUMAB 100 MG ~~LOC~~ SOLR
100.0000 mg | SUBCUTANEOUS | Status: DC
  Administered 2017-10-18: 100 mg via SUBCUTANEOUS

## 2017-10-23 ENCOUNTER — Other Ambulatory Visit: Payer: Self-pay | Admitting: Family Medicine

## 2017-10-28 ENCOUNTER — Ambulatory Visit (INDEPENDENT_AMBULATORY_CARE_PROVIDER_SITE_OTHER): Payer: Medicare Other | Admitting: Pulmonary Disease

## 2017-10-28 ENCOUNTER — Encounter: Payer: Self-pay | Admitting: Pulmonary Disease

## 2017-10-28 ENCOUNTER — Encounter: Payer: Self-pay | Admitting: Family Medicine

## 2017-10-28 ENCOUNTER — Other Ambulatory Visit: Payer: Medicare Other

## 2017-10-28 VITALS — BP 128/76 | HR 72 | Ht 60.24 in | Wt 141.0 lb

## 2017-10-28 DIAGNOSIS — K219 Gastro-esophageal reflux disease without esophagitis: Secondary | ICD-10-CM

## 2017-10-28 DIAGNOSIS — J029 Acute pharyngitis, unspecified: Secondary | ICD-10-CM

## 2017-10-28 DIAGNOSIS — R0600 Dyspnea, unspecified: Secondary | ICD-10-CM

## 2017-10-28 DIAGNOSIS — J302 Other seasonal allergic rhinitis: Secondary | ICD-10-CM

## 2017-10-28 DIAGNOSIS — J45909 Unspecified asthma, uncomplicated: Secondary | ICD-10-CM

## 2017-10-28 DIAGNOSIS — J3089 Other allergic rhinitis: Secondary | ICD-10-CM

## 2017-10-28 LAB — STREP COMPLETE PANEL
Strep Pyogenes: NEGATIVE
Strep dysgalactiae: NEGATIVE

## 2017-10-28 LAB — NITRIC OXIDE: Nitric Oxide: 19
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
## 2017-10-28 NOTE — Addendum Note (Signed)
Addended by: Dolores Lory on: 10/28/2017 11:21 AM   Modules accepted: Orders

## 2017-10-28 NOTE — Patient Instructions (Signed)
Shortness of breath: I believe that this is due to deconditioning, otherwise being out of shape Start exercising more, you could try walking one quarter of a mile a day and gradually increasing that  Coronary calcification: Continue follow-up with cardiology  Acute sore throat: Today's physical exam seems to be consistent with a viral cause We will check his rapid strep test and a throat culture Increase use of salt water gargles  Asthma: As described today, I still do not think you have this condition. Stop Nucala Continue Dulera for now Spirometry test today Exhaled nitric oxide test today We will see you back in 1 month and we will repeat these to test.  If you have trouble breathing let us know.  Muscle tension dysphonia: Continue speech therapy as directed by Dr. Joya Gaskins  We will see you back in 1 month or sooner if needed  High-dose flu shot today

## 2017-10-28 NOTE — Addendum Note (Signed)
Addended by: Valerie Salts on: 10/28/2017 11:28 AM   Modules accepted: Orders

## 2017-10-28 NOTE — Addendum Note (Signed)
Addended by: Trenda Moots on: 0/04/7942 11:06 AM   Modules accepted: Orders

## 2017-11-15 ENCOUNTER — Ambulatory Visit: Payer: Medicare Other

## 2017-11-23 ENCOUNTER — Encounter: Payer: Self-pay | Admitting: Family Medicine

## 2017-11-25 ENCOUNTER — Other Ambulatory Visit: Payer: Self-pay | Admitting: Family Medicine

## 2017-11-27 ENCOUNTER — Ambulatory Visit (INDEPENDENT_AMBULATORY_CARE_PROVIDER_SITE_OTHER): Payer: Medicare Other | Admitting: Pulmonary Disease

## 2017-11-27 ENCOUNTER — Other Ambulatory Visit: Payer: Medicare Other

## 2017-11-27 ENCOUNTER — Encounter: Payer: Self-pay | Admitting: Pulmonary Disease

## 2017-11-27 ENCOUNTER — Encounter: Payer: Self-pay | Admitting: Family Medicine

## 2017-11-27 VITALS — BP 144/80 | HR 82 | Ht 61.0 in | Wt 134.8 lb

## 2017-11-27 DIAGNOSIS — J4550 Severe persistent asthma, uncomplicated: Secondary | ICD-10-CM | POA: Diagnosis not present

## 2017-11-27 DIAGNOSIS — R0600 Dyspnea, unspecified: Secondary | ICD-10-CM

## 2017-11-27 DIAGNOSIS — K219 Gastro-esophageal reflux disease without esophagitis: Secondary | ICD-10-CM

## 2017-11-27 DIAGNOSIS — Z23 Encounter for immunization: Secondary | ICD-10-CM

## 2017-11-27 DIAGNOSIS — J3089 Other allergic rhinitis: Secondary | ICD-10-CM

## 2017-11-27 DIAGNOSIS — J302 Other seasonal allergic rhinitis: Secondary | ICD-10-CM

## 2017-11-27 DIAGNOSIS — J45909 Unspecified asthma, uncomplicated: Secondary | ICD-10-CM | POA: Diagnosis not present

## 2017-11-27 LAB — NITRIC OXIDE: Nitric Oxide: 15
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
## 2017-11-27 NOTE — Patient Instructions (Signed)
Asthma: As stated previously I do not think you have this condition We will check spirometry testing today We will check exhaled nitric oxide testing today Decrease Dulera to 1 puff twice a day  Muscle tension dysphonia: Continue speech therapy as directed by Dr. Joya Gaskins  Shortness of breath: Multifactorial problem due to deconditioning, out of shape, muscle tension dysphonia  High-dose flu shot today  Follow-up 1 month, if symptoms are stable at that point we will further decrease Dulera.

## 2017-11-28 ENCOUNTER — Ambulatory Visit: Payer: Medicare Other | Admitting: Family Medicine

## 2017-12-02 ENCOUNTER — Other Ambulatory Visit: Payer: Self-pay | Admitting: Pulmonary Disease

## 2017-12-02 ENCOUNTER — Ambulatory Visit: Payer: Medicare Other | Admitting: Family Medicine

## 2017-12-02 ENCOUNTER — Other Ambulatory Visit: Payer: Medicare Other

## 2017-12-02 DIAGNOSIS — E0590 Thyrotoxicosis, unspecified without thyrotoxic crisis or storm: Secondary | ICD-10-CM

## 2017-12-02 DIAGNOSIS — I10 Essential (primary) hypertension: Secondary | ICD-10-CM

## 2017-12-02 DIAGNOSIS — E7800 Pure hypercholesterolemia, unspecified: Secondary | ICD-10-CM

## 2017-12-02 MED ORDER — EPINEPHRINE 0.3 MG/0.3ML IJ SOAJ: 0.3000 mg | Freq: Once | INTRAMUSCULAR | 1 refills | Status: DC | PRN

## 2017-12-03 LAB — LIPID PANEL
Cholesterol: 158 mg/dL (ref <200)
HDL: 64 mg/dL (ref >50)
LDL Cholesterol (Calc): 72 mg/dL (calc)
Non-HDL Cholesterol (Calc): 94 mg/dL (calc) (ref <130)
Total CHOL/HDL Ratio: 2.5 (calc) (ref <5.0)
Triglycerides: 134 mg/dL (ref <150)

## 2017-12-03 LAB — COMPLETE METABOLIC PANEL WITH GFR
AG Ratio: 1.8 (calc) (ref 1.0–2.5)
ALT: 49 U/L — ABNORMAL HIGH (ref 6–29)
AST: 42 U/L — ABNORMAL HIGH (ref 10–35)
Albumin: 3.9 g/dL (ref 3.6–5.1)
Alkaline phosphatase (APISO): 46 U/L (ref 33–130)
BUN/Creatinine Ratio: 18 (calc) (ref 6–22)
BUN: 22 mg/dL (ref 7–25)
CO2: 25 mmol/L (ref 20–32)
Calcium: 9.5 mg/dL (ref 8.6–10.4)
Chloride: 107 mmol/L (ref 98–110)
Creat: 1.19 mg/dL — ABNORMAL HIGH (ref 0.50–0.99)
GFR, Est African American: 54 mL/min/{1.73_m2} — ABNORMAL LOW (ref >=60)
GFR, Est Non African American: 47 mL/min/{1.73_m2} — ABNORMAL LOW (ref >=60)
Globulin: 2.2 g/dL (calc) (ref 1.9–3.7)
Glucose, Bld: 73 mg/dL (ref 65–99)
Potassium: 4 mmol/L (ref 3.5–5.3)
Sodium: 143 mmol/L (ref 135–146)
Total Bilirubin: 0.6 mg/dL (ref 0.2–1.2)
Total Protein: 6.1 g/dL (ref 6.1–8.1)

## 2017-12-03 LAB — CBC WITH DIFFERENTIAL/PLATELET
Basophils Absolute: 52 cells/uL (ref 0–200)
Basophils Relative: 0.8 %
Eosinophils Absolute: 72 cells/uL (ref 15–500)
Eosinophils Relative: 1.1 %
HCT: 39.2 % (ref 35.0–45.0)
Hemoglobin: 13.1 g/dL (ref 11.7–15.5)
Lymphs Abs: 2542 cells/uL (ref 850–3900)
MCH: 33.2 pg — ABNORMAL HIGH (ref 27.0–33.0)
MCHC: 33.4 g/dL (ref 32.0–36.0)
MCV: 99.2 fL (ref 80.0–100.0)
MPV: 10.4 fL (ref 7.5–12.5)
Monocytes Relative: 14.5 %
Neutro Abs: 2893 cells/uL (ref 1500–7800)
Neutrophils Relative %: 44.5 %
Platelets: 214 10*3/uL (ref 140–400)
RBC: 3.95 10*6/uL (ref 3.80–5.10)
RDW: 12.3 % (ref 11.0–15.0)
Total Lymphocyte: 39.1 %
WBC mixed population: 943 cells/uL (ref 200–950)
WBC: 6.5 10*3/uL (ref 3.8–10.8)

## 2017-12-03 LAB — TSH: TSH: 18.1 mIU/L — ABNORMAL HIGH (ref 0.40–4.50)

## 2017-12-05 ENCOUNTER — Encounter: Payer: Self-pay | Admitting: Family Medicine

## 2017-12-05 ENCOUNTER — Ambulatory Visit (INDEPENDENT_AMBULATORY_CARE_PROVIDER_SITE_OTHER): Payer: Medicare Other | Admitting: Family Medicine

## 2017-12-05 VITALS — BP 124/74 | HR 78 | Temp 98.3°F | Resp 14 | Ht 62.0 in | Wt 135.0 lb

## 2017-12-05 DIAGNOSIS — E0590 Thyrotoxicosis, unspecified without thyrotoxic crisis or storm: Secondary | ICD-10-CM | POA: Diagnosis not present

## 2017-12-05 DIAGNOSIS — R945 Abnormal results of liver function studies: Secondary | ICD-10-CM

## 2017-12-05 DIAGNOSIS — R7989 Other specified abnormal findings of blood chemistry: Secondary | ICD-10-CM

## 2017-12-05 DIAGNOSIS — E7800 Pure hypercholesterolemia, unspecified: Secondary | ICD-10-CM | POA: Diagnosis not present

## 2017-12-05 MED ORDER — DICLOFENAC SODIUM 1 % TD GEL: 2.0000 g | Freq: Four times a day (QID) | TRANSDERMAL | 3 refills | Status: DC

## 2017-12-05 NOTE — Progress Notes (Signed)
Subjective:    Patient ID: Ebony Scott, female    DOB: 08-14-1950, 67 y.o.   MRN: [id number]  HPI  Patient is here today for follow-up of her chronic medical problems.  Her most recent lab work is listed below: Lab on 12/02/2017  Component Date Value Ref Range Status  . WBC 12/02/2017 6.5  3.8 - 10.8 Thousand/uL Final  . RBC 12/02/2017 3.95  3.80 - 5.10 Million/uL Final  . Hemoglobin 12/02/2017 13.1  11.7 - 15.5 g/dL Final  . HCT 12/02/2017 39.2  35.0 - 45.0 % Final  . MCV 12/02/2017 99.2  80.0 - 100.0 fL Final  . MCH 12/02/2017 33.2* 27.0 - 33.0 pg Final  . MCHC 12/02/2017 33.4  32.0 - 36.0 g/dL Final  . RDW 12/02/2017 12.3  11.0 - 15.0 % Final  . Platelets 12/02/2017 214  140 - 400 Thousand/uL Final  . MPV 12/02/2017 10.4  7.5 - 12.5 fL Final  . Neutro Abs 12/02/2017 2,893  1,500 - 7,800 cells/uL Final  . Lymphs Abs 12/02/2017 2,542  850 - 3,900 cells/uL Final  . WBC mixed population 12/02/2017 943  200 - 950 cells/uL Final  . Eosinophils Absolute 12/02/2017 72  15 - 500 cells/uL Final  . Basophils Absolute 12/02/2017 52  0 - 200 cells/uL Final  . Neutrophils Relative % 12/02/2017 44.5  % Final  . Total Lymphocyte 12/02/2017 39.1  % Final  . Monocytes Relative 12/02/2017 14.5  % Final  . Eosinophils Relative 12/02/2017 1.1  % Final  . Basophils Relative 12/02/2017 0.8  % Final  . Glucose, Bld 12/02/2017 73  65 - 99 mg/dL Final   Comment: .            Fasting reference interval .   . BUN 12/02/2017 22  7 - 25 mg/dL Final  . Creat 12/02/2017 1.19* 0.50 - 0.99 mg/dL Final   Comment: For patients >69 years of age, the reference limit for Creatinine is approximately 13% higher for people identified as African-American. .   . GFR, Est Non African American 12/02/2017 47* > OR = 60 mL/min/1.40m2 Final  . GFR, Est African American 12/02/2017 54* > OR = 60 mL/min/1.81m2 Final  . BUN/Creatinine Ratio 12/02/2017 18  6 - 22 (calc) Final  . Sodium 12/02/2017 143  135 - 146 mmol/L  Final  . Potassium 12/02/2017 4.0  3.5 - 5.3 mmol/L Final  . Chloride 12/02/2017 107  98 - 110 mmol/L Final  . CO2 12/02/2017 25  20 - 32 mmol/L Final  . Calcium 12/02/2017 9.5  8.6 - 10.4 mg/dL Final  . Total Protein 12/02/2017 6.1  6.1 - 8.1 g/dL Final  . Albumin 12/02/2017 3.9  3.6 - 5.1 g/dL Final  . Globulin 12/02/2017 2.2  1.9 - 3.7 g/dL (calc) Final  . AG Ratio 12/02/2017 1.8  1.0 - 2.5 (calc) Final  . Total Bilirubin 12/02/2017 0.6  0.2 - 1.2 mg/dL Final  . Alkaline phosphatase (APISO) 12/02/2017 46  33 - 130 U/L Final  . AST 12/02/2017 42* 10 - 35 U/L Final  . ALT 12/02/2017 49* 6 - 29 U/L Final  . Cholesterol 12/02/2017 158  <200 mg/dL Final  . HDL 12/02/2017 64  >50 mg/dL Final  . Triglycerides 12/02/2017 134  <150 mg/dL Final  . LDL Cholesterol (Calc) 12/02/2017 72  mg/dL (calc) Final   Comment: Reference range: <100 . Desirable range <100 mg/dL for primary prevention;   <70 mg/dL for patients with CHD or diabetic  patients  with > or = 2 CHD risk factors. Marland Kitchen LDL-C is now calculated using the Martin-Hopkins  calculation, which is a validated novel method providing  better accuracy than the Friedewald equation in the  estimation of LDL-C.  Cresenciano Genre et al. Annamaria Helling. 3762;831(51): 2061-2068  (http://education.[facility].com/faq/FAQ164)   . Total CHOL/HDL Ratio 12/02/2017 2.5  <5.0 (calc) Final  . Non-HDL Cholesterol (Calc) 12/02/2017 94  <130 mg/dL (calc) Final   Comment: For patients with diabetes plus 1 major ASCVD risk  factor, treating to a non-HDL-C goal of <100 mg/dL  (LDL-C of <70 mg/dL) is considered a therapeutic  option.   Marland Kitchen TSH 12/02/2017 18.10* 0.40 - 4.50 mIU/L Final  Office Visit on 11/27/2017  Component Date Value Ref Range Status  . Nitric Oxide 11/27/2017 15   Final   TSH is substantially elevated.  She is taking methimazole twice a day for.  She has been on this medication for years long before I became her doctor.  This is the first time her TSH  is been elevated.  She reports fatigue.  She reports weight gain.  She reports some shortness of breath with activity.  She denies constipation.  Her liver function test are also elevated.  She is on Lipitor for hyperlipidemia.  Her cholesterol is excellent.  She is also taking Tylenol 2 extra strength tablets in the morning and 2 extra strength tablets in the evening.  She denies any alcohol use Past Medical History:  Diagnosis Date  . Allergic rhinitis   . Allergy    SEASONAL  . Anemia   . Anxiety    pt denies  . Arthritis    "mostly the hands" (02/20/2012)  . Asthma   . Breast cancer ([place]) 1998   in remission  . Cataract    REMOVED  . Chronic lower back pain   . Chronic neck pain   . Complication of anesthesia    "had hard time waking up from it several times" (02/20/2012)  . Depression    "some; don't take anything for it" (02/20/2012)  . Diverticulosis   . DVT (deep venous thrombosis) ([place])   . Exertional dyspnea   . Fibromyalgia 11/2011  . GERD (gastroesophageal reflux disease)   . Graves disease   . Headache(784.0)    "related to allergies; more at different times during the year" (02/20/2012)  . Hemorrhoids   . Hiatal hernia    back and neck  . Hx of adenomatous colonic polyps 04/12/2016  . Hypercholesteremia    good cholesterol is high  . Hypothyroidism   . IBS (irritable bowel syndrome)   . Moderate persistent asthma    -FeV1 72% 2011, -IgE 102 2011, CT sinus Neg 2011  . Osteoporosis    on reclast yearly  . Pneumonia 04/2011; ~ 11/2011   "double; single" (02/20/2012)   Past Surgical History:  Procedure Laterality Date  . ANTERIOR AND POSTERIOR REPAIR  1990's  . APPENDECTOMY    . BREAST LUMPECTOMY  1998   left  . CARPOMETACARPEL ([place]) FUSION OF THUMB WITH AUTOGRAFT FROM RADIUS  ~ 2009   "both thumbs" (02/20/2012)  . CATARACT EXTRACTION W/ INTRAOCULAR LENS  IMPLANT, BILATERAL  2012  . CERVICAL DISCECTOMY  10/2001   C5-C6  . CERVICAL FUSION  2003   C3-C4  .  CHOLECYSTECTOMY    . COLONOSCOPY    . DEBRIDEMENT TENNIS ELBOW  ?1970's   right  . ESOPHAGOGASTRODUODENOSCOPY    . HYSTERECTOMY    . KNEE  ARTHROPLASTY  ?1990's   "?right; w/cartilage repair" (02/20/2012)  . NASAL SEPTUM SURGERY  1980's  . POSTERIOR CERVICAL FUSION/FORAMINOTOMY  2004   "failed initial fusion; rewired  anterior neck" (02/20/2012)  . TONSILLECTOMY  ~ 1953  . VESICOVAGINAL FISTULA CLOSURE W/ TAH  1988  . VIDEO BRONCHOSCOPY Bilateral 08/23/2016   Procedure: VIDEO BRONCHOSCOPY WITH FLUORO;  Surgeon: Javier Glazier, MD;  Location: [facility];  Service: Cardiopulmonary;  Laterality: Bilateral;    Current Outpatient Medications on File Prior to Visit  Medication Sig Dispense Refill  . acetaminophen (TYLENOL) 650 MG CR tablet Take 1,300 mg by mouth 2 (two) times daily.    Marland Kitchen albuterol (PROAIR HFA) 108 (90 Base) MCG/ACT inhaler Inhale 2 puffs into the lungs every 4 (four) hours as needed for wheezing or shortness of breath. 18 g 5  . Alpha-D-Galactosidase (BEANO PO) Take 1 capsule by mouth 3 (three) times daily with meals.    . Alpha-Lipoic Acid 600 MG CAPS Take 600 mg by mouth daily.     Marland Kitchen alum & mag hydroxide-simeth (GELUSIL) 200-200-25 MG chewable tablet Chew 1 tablet by mouth every 6 (six) hours as needed for flatulence.    Marland Kitchen atorvastatin (LIPITOR) 40 MG tablet Take 1 tablet (40 mg total) by mouth daily. 90 tablet 3  . azelastine (ASTELIN) 0.1 % nasal spray USE 2 SPRAYS IN EACH NOSTRIL DAILY AS DIRECTED 30 mL 5  . calcium citrate-vitamin D (CITRACAL+D) 315-200 MG-UNIT tablet Take 1 tablet by mouth 2 (two) times daily.    . cetirizine (ZYRTEC) 10 MG tablet Take 5 mg by mouth every evening.     . Cholecalciferol (VITAMIN D3) 10000 units TABS Take 1,000 Units by mouth daily.    . Colchicine 0.6 MG CAPS TAKE 1 CAPSULE BY MOUTH DAILY AS NEEDED 30 capsule 2  . CREON 24000-76000 units CPEP TAKE 1 CAPSULE BY MOUTH THREE TIMES DAILY (WITH MEALS) 270 each 2  . cyproheptadine (PERIACTIN)  4 MG tablet TAKE 2 TABLETS BY MOUTH EVERY EVENING 60 tablet 5  . denosumab (PROLIA) 60 MG/ML SOLN injection Inject 60 mg into the skin every 6 (six) months. Administer in upper arm, thigh, or abdomen    . dexlansoprazole (DEXILANT) 60 MG capsule Take 1 capsule (60 mg total) by mouth daily. 30 capsule 5  . EPINEPHrine (EPIPEN 2-PAK) 0.3 mg/0.3 mL IJ SOAJ injection Inject 0.3 mLs (0.3 mg total) into the muscle Once PRN for up to 1 dose. 1 Device 1  . gabapentin (NEURONTIN) 600 MG tablet TAKE 1 TABLET BY MOUTH DAILY 90 tablet 3  . guaifenesin (HUMIBID E) 400 MG TABS tablet Take 400 mg by mouth 2 (two) times daily.    . hydrocortisone (ANUSOL-HC) 2.5 % rectal cream APPLY AS NEEDED AS DIRECTED 30 g 5  . IRON PO TAKE 1 TABLET BY MOUTH ONCE DAILY    . Lactase (LACTAID FAST ACT) 9000 units TABS Take 18,000 Units by mouth 4 (four) times daily as needed (DAIRY).    Marland Kitchen lidocaine (LIDODERM) 5 % Place 1 patch onto the skin daily. Remove & Discard patch within 12 hours or as directed by MD 90 patch 3  . methimazole (TAPAZOLE) 5 MG tablet TAKE 1 TABLET BY MOUTH 3 TIMES DAILY (Patient taking differently: 2 (two) times daily. ) 270 tablet 1  . mirtazapine (REMERON SOL-TAB) 30 MG disintegrating tablet DISOLVE 1 TABLET BY MOUTH DAILY AT BEDTIME 30 tablet 5  . mometasone (NASONEX) 50 MCG/ACT nasal spray Place 2 sprays into the  nose 2 (two) times daily. 102 g 3  . mometasone-formoterol (DULERA) 200-5 MCG/ACT AERO Inhale 2 puffs into the lungs 2 (two) times daily. (Patient taking differently: Inhale 1 puff into the lungs 2 (two) times daily. ) 1 Inhaler 5  . montelukast (SINGULAIR) 10 MG tablet Take 1 tablet (10 mg total) by mouth daily. 30 tablet 5  . mupirocin ointment (BACTROBAN) 2 % APPLY IN THE NOSE 3 TIMES DAILY AS NEEDED 22 g 2  . nystatin (MYCOSTATIN) 100000 UNIT/ML suspension USE AS DIRECTED 5MLS IN MOUTH OR THROAT 4 TIMES DAILY 473 mL 2  . nystatin-triamcinolone ointment (MYCOLOG) Apply 1 application topically 3  (three) times daily as needed (YEAST INFECTION).     . Prenatal Vit-Sel-Fe Fum-FA (VINATE M) 27-1 MG TABS Take 1 tablet by mouth daily. 90 each 3  . PRESCRIPTION MEDICATION Place 2 puffs into both ears daily as needed. Pt has compounded capsule (powder) that she uses In her ears as needed; CS (X) AHCL    CHLORAMP/SULFU/AMPHO B/ H DROC 100-100-5 mg capsule    . Probiotic Product (ALIGN) 4 MG CAPS Take 1 tablet by mouth daily.     Marland Kitchen Propylene Glycol-Glycerin (SOOTHE OP) Place 1 drop into both eyes 2 (two) times daily.     . ranitidine (ZANTAC) 300 MG tablet Take 1 tablet (300 mg total) by mouth daily. 30 tablet 5  . Respiratory Therapy Supplies (FLUTTER) DEVI Use as directed 1 each 0  . SIMPLY SALINE NA Place 1 spray into both nostrils at bedtime.    Marland Kitchen Spacer/Aero-Holding Chambers (AEROCHAMBER MV) inhaler Use as instructed 1 each 0  . Tiotropium Bromide Monohydrate (SPIRIVA RESPIMAT) 2.5 MCG/ACT AERS Inhale 2 puffs into the lungs daily. 1 Inhaler 5   Current Facility-Administered Medications on File Prior to Visit  Medication Dose Route Frequency Provider Last Rate Last Dose  . denosumab (PROLIA) injection 60 mg  60 mg Subcutaneous Q6 months [name], MD   60 mg at 08/15/17 1155  . Mepolizumab SOLR 100 mg  100 mg Subcutaneous Q28 days Simonne Maffucci B, MD   100 mg at 05/09/17 0900  . Mepolizumab SOLR 100 mg  100 mg Subcutaneous Q28 days Simonne Maffucci B, MD   100 mg at 09/12/17 1415  . Mepolizumab SOLR 100 mg  100 mg Subcutaneous Q28 days Simonne Maffucci B, MD   100 mg at 10/18/17 1224   Allergies  Allergen Reactions  . Dust Mite Extract Shortness Of Breath and Other (See Comments)    "sneezing" (02/20/2012)  . Molds & Smuts Shortness Of Breath  . Morphine Sulfate Itching  . Other Shortness Of Breath and Other (See Comments)    Grass and weeds "sneezing; filled sinuses" (02/20/2012)  . Penicillins Rash and Other (See Comments)    "welts" (02/20/2012) Has patient had a PCN  reaction causing immediate rash, facial/tongue/throat swelling, SOB or lightheadedness with hypotension: Unknown Has patient had a PCN reaction causing severe rash involving mucus membranes or skin necrosis: No Has patient had a PCN reaction that required hospitalization: No Has patient had a PCN reaction occurring within the last 10 years: No If all of the above answers are "NO", then may proceed with Cephalosporin use.   Marland Kitchen Reclast [Zoledronic Acid] Other (See Comments)    Fever, Put in hospital, dr said it was a reaction from a reaction   . Rofecoxib Swelling    Celebrex/vioxx REACTION: feet swelling  . Shrimp Flavor Anaphylaxis    ALL SHELLFISH  .  Tetracycline Hcl Nausea And Vomiting  . Xolair [Omalizumab] Other (See Comments)    Caused Blood clot  . Dilaudid [Hydromorphone Hcl] Itching  . Hydrocodone-Acetaminophen Nausea And Vomiting  . Levofloxacin Other (See Comments)    REACTION: GI upset  . Oxycodone Hcl Nausea And Vomiting  . Paroxetine Nausea And Vomiting    Paxil   . Diltiazem Swelling  . Tree Extract Other (See Comments)    "tested and told I was allergic to it; never experienced a reaction to it" (02/20/2012)   Social History   Socioeconomic History  . Marital status: Divorced    Spouse name: Not on file  . Number of children: 2  . Years of education: college  . Highest education level: Not on file  Occupational History  . Occupation: Disabled    Comment: Retired Engineer, production: RETIRED  Social Needs  . Financial resource strain: Not on file  . Food insecurity:    Worry: Not on file    Inability: Not on file  . Transportation needs:    Medical: Not on file    Non-medical: Not on file  Tobacco Use  . Smoking status: Passive Smoke Exposure - Never Smoker  . Smokeless tobacco: Never Used  . Tobacco comment: Parents  Substance and Sexual Activity  . Alcohol use: Yes    Alcohol/week: 0.0 standard drinks    Comment: 02/20/2012  "couple glasses of wine/6 months"  . Drug use: No  . Sexual activity: Never  Lifestyle  . Physical activity:    Days per week: Not on file    Minutes per session: Not on file  . Stress: Not on file  Relationships  . Social connections:    Talks on phone: Not on file    Gets together: Not on file    Attends religious service: Not on file    Active member of club or organization: Not on file    Attends meetings of clubs or organizations: Not on file    Relationship status: Not on file  . Intimate partner violence:    Fear of current or ex partner: Not on file    Emotionally abused: Not on file    Physically abused: Not on file    Forced sexual activity: Not on file  Other Topics Concern  . Not on file  Social History Narrative   Patient lives at home alone. Patient  divorced.    Patient has her BS degree.   Right handed.   Caffeine- sometimes coffee.      [facility]:   Born in [place], [place]. She worked as a Copywriter, advertising. She has no pets currently. She does have indoor plants. Previously had mold in her home that was remediated. Carpet was removed.             Review of Systems  All other systems reviewed and are negative.      Objective:   Physical Exam  Constitutional: She appears well-developed and well-nourished. No distress.  HENT:  Nose: Nose normal.  Mouth/Throat: Oropharynx is clear and moist. No oropharyngeal exudate.  Neck: Neck supple. No thyromegaly present.  Cardiovascular: Normal rate, regular rhythm and normal heart sounds.  Pulmonary/Chest: Effort normal. No respiratory distress. She has no wheezes. She has no rales.  Abdominal: Soft. Bowel sounds are normal.  Musculoskeletal: She exhibits no edema.  Skin: She is not diaphoretic.  Vitals reviewed.         Assessment & Plan:  Pure hypercholesterolemia  Hyperthyroidism  Elevated LFTs Cholesterol is now well managed.  I am not certain if the elevated LFTs are secondary to her  cholesterol medication versus Tylenol versus something else.  Therefore we will have the patient discontinue her Tylenol and recheck lab work in 1-1/2 weeks to recheck her ever function test.  Consider right upper quadrant ultrasound and viral hepatitis serologies if persistently elevated.  Discontinue methimazole and recheck TSH in 1 month.

## 2017-12-06 ENCOUNTER — Encounter: Payer: Self-pay | Admitting: Family Medicine

## 2017-12-12 MED ORDER — CYCLOBENZAPRINE HCL 5 MG PO TABS: 5.0000 mg | ORAL_TABLET | Freq: Three times a day (TID) | ORAL | 1 refills | Status: DC | PRN

## 2017-12-13 ENCOUNTER — Emergency Department (HOSPITAL_COMMUNITY): Payer: Medicare Other

## 2017-12-13 ENCOUNTER — Inpatient Hospital Stay (HOSPITAL_COMMUNITY): Payer: Medicare Other

## 2017-12-13 ENCOUNTER — Telehealth: Payer: Self-pay | Admitting: *Deleted

## 2017-12-13 ENCOUNTER — Other Ambulatory Visit: Payer: Self-pay

## 2017-12-13 ENCOUNTER — Inpatient Hospital Stay (HOSPITAL_COMMUNITY)
Admission: EM | Admit: 2017-12-13 | Discharge: 2017-12-24 | DRG: 175 | Disposition: A | Discharge status: Skilled Nursing Facility | Payer: Medicare Other | Attending: Internal Medicine | Admitting: Internal Medicine

## 2017-12-13 ENCOUNTER — Encounter (HOSPITAL_COMMUNITY): Payer: Self-pay | Admitting: Emergency Medicine

## 2017-12-13 DIAGNOSIS — S40211A Abrasion of right shoulder, initial encounter: Secondary | ICD-10-CM | POA: Diagnosis present

## 2017-12-13 DIAGNOSIS — D72829 Elevated white blood cell count, unspecified: Secondary | ICD-10-CM | POA: Diagnosis not present

## 2017-12-13 DIAGNOSIS — I82401 Acute embolism and thrombosis of unspecified deep veins of right lower extremity: Secondary | ICD-10-CM

## 2017-12-13 DIAGNOSIS — Z7951 Long term (current) use of inhaled steroids: Secondary | ICD-10-CM | POA: Diagnosis not present

## 2017-12-13 DIAGNOSIS — M1120 Other chondrocalcinosis, unspecified site: Secondary | ICD-10-CM | POA: Diagnosis present

## 2017-12-13 DIAGNOSIS — Z833 Family history of diabetes mellitus: Secondary | ICD-10-CM

## 2017-12-13 DIAGNOSIS — M25572 Pain in left ankle and joints of left foot: Secondary | ICD-10-CM

## 2017-12-13 DIAGNOSIS — I5032 Chronic diastolic (congestive) heart failure: Secondary | ICD-10-CM | POA: Diagnosis present

## 2017-12-13 DIAGNOSIS — Z853 Personal history of malignant neoplasm of breast: Secondary | ICD-10-CM

## 2017-12-13 DIAGNOSIS — L03116 Cellulitis of left lower limb: Secondary | ICD-10-CM | POA: Diagnosis present

## 2017-12-13 DIAGNOSIS — M11272 Other chondrocalcinosis, left ankle and foot: Secondary | ICD-10-CM | POA: Diagnosis present

## 2017-12-13 DIAGNOSIS — I5022 Chronic systolic (congestive) heart failure: Secondary | ICD-10-CM | POA: Diagnosis present

## 2017-12-13 DIAGNOSIS — A419 Sepsis, unspecified organism: Secondary | ICD-10-CM | POA: Diagnosis present

## 2017-12-13 DIAGNOSIS — E785 Hyperlipidemia, unspecified: Secondary | ICD-10-CM | POA: Diagnosis present

## 2017-12-13 DIAGNOSIS — Z7952 Long term (current) use of systemic steroids: Secondary | ICD-10-CM | POA: Diagnosis not present

## 2017-12-13 DIAGNOSIS — F329 Major depressive disorder, single episode, unspecified: Secondary | ICD-10-CM | POA: Diagnosis present

## 2017-12-13 DIAGNOSIS — R52 Pain, unspecified: Secondary | ICD-10-CM

## 2017-12-13 DIAGNOSIS — T796XXA Traumatic ischemia of muscle, initial encounter: Secondary | ICD-10-CM | POA: Diagnosis not present

## 2017-12-13 DIAGNOSIS — Y92009 Unspecified place in unspecified non-institutional (private) residence as the place of occurrence of the external cause: Secondary | ICD-10-CM | POA: Diagnosis not present

## 2017-12-13 DIAGNOSIS — Z90710 Acquired absence of both cervix and uterus: Secondary | ICD-10-CM

## 2017-12-13 DIAGNOSIS — D509 Iron deficiency anemia, unspecified: Secondary | ICD-10-CM | POA: Diagnosis present

## 2017-12-13 DIAGNOSIS — N939 Abnormal uterine and vaginal bleeding, unspecified: Secondary | ICD-10-CM

## 2017-12-13 DIAGNOSIS — F419 Anxiety disorder, unspecified: Secondary | ICD-10-CM | POA: Diagnosis present

## 2017-12-13 DIAGNOSIS — E0590 Thyrotoxicosis, unspecified without thyrotoxic crisis or storm: Secondary | ICD-10-CM | POA: Diagnosis not present

## 2017-12-13 DIAGNOSIS — I2699 Other pulmonary embolism without acute cor pulmonale: Secondary | ICD-10-CM | POA: Diagnosis present

## 2017-12-13 DIAGNOSIS — K219 Gastro-esophageal reflux disease without esophagitis: Secondary | ICD-10-CM | POA: Diagnosis present

## 2017-12-13 DIAGNOSIS — J45909 Unspecified asthma, uncomplicated: Secondary | ICD-10-CM | POA: Diagnosis present

## 2017-12-13 DIAGNOSIS — M25579 Pain in unspecified ankle and joints of unspecified foot: Secondary | ICD-10-CM

## 2017-12-13 DIAGNOSIS — M797 Fibromyalgia: Secondary | ICD-10-CM | POA: Diagnosis present

## 2017-12-13 DIAGNOSIS — R5381 Other malaise: Secondary | ICD-10-CM

## 2017-12-13 DIAGNOSIS — M6282 Rhabdomyolysis: Secondary | ICD-10-CM | POA: Diagnosis present

## 2017-12-13 DIAGNOSIS — G894 Chronic pain syndrome: Secondary | ICD-10-CM

## 2017-12-13 DIAGNOSIS — E039 Hypothyroidism, unspecified: Secondary | ICD-10-CM | POA: Diagnosis present

## 2017-12-13 DIAGNOSIS — R0902 Hypoxemia: Secondary | ICD-10-CM | POA: Diagnosis present

## 2017-12-13 DIAGNOSIS — K589 Irritable bowel syndrome without diarrhea: Secondary | ICD-10-CM | POA: Diagnosis present

## 2017-12-13 DIAGNOSIS — M25569 Pain in unspecified knee: Secondary | ICD-10-CM | POA: Diagnosis not present

## 2017-12-13 DIAGNOSIS — E0500 Thyrotoxicosis with diffuse goiter without thyrotoxic crisis or storm: Secondary | ICD-10-CM | POA: Diagnosis present

## 2017-12-13 DIAGNOSIS — N184 Chronic kidney disease, stage 4 (severe): Secondary | ICD-10-CM | POA: Diagnosis present

## 2017-12-13 DIAGNOSIS — E876 Hypokalemia: Secondary | ICD-10-CM | POA: Diagnosis present

## 2017-12-13 DIAGNOSIS — I82451 Acute embolism and thrombosis of right peroneal vein: Secondary | ICD-10-CM | POA: Diagnosis present

## 2017-12-13 DIAGNOSIS — E43 Unspecified severe protein-calorie malnutrition: Secondary | ICD-10-CM | POA: Diagnosis present

## 2017-12-13 DIAGNOSIS — Z791 Long term (current) use of non-steroidal anti-inflammatories (NSAID): Secondary | ICD-10-CM | POA: Diagnosis not present

## 2017-12-13 DIAGNOSIS — I5042 Chronic combined systolic (congestive) and diastolic (congestive) heart failure: Secondary | ICD-10-CM | POA: Diagnosis present

## 2017-12-13 DIAGNOSIS — E440 Moderate protein-calorie malnutrition: Secondary | ICD-10-CM

## 2017-12-13 DIAGNOSIS — W1830XA Fall on same level, unspecified, initial encounter: Secondary | ICD-10-CM | POA: Diagnosis present

## 2017-12-13 DIAGNOSIS — Z79899 Other long term (current) drug therapy: Secondary | ICD-10-CM | POA: Diagnosis not present

## 2017-12-13 DIAGNOSIS — I824Y1 Acute embolism and thrombosis of unspecified deep veins of right proximal lower extremity: Secondary | ICD-10-CM | POA: Diagnosis not present

## 2017-12-13 DIAGNOSIS — Z981 Arthrodesis status: Secondary | ICD-10-CM

## 2017-12-13 DIAGNOSIS — M810 Age-related osteoporosis without current pathological fracture: Secondary | ICD-10-CM | POA: Diagnosis present

## 2017-12-13 DIAGNOSIS — I776 Arteritis, unspecified: Secondary | ICD-10-CM | POA: Diagnosis present

## 2017-12-13 DIAGNOSIS — Z9049 Acquired absence of other specified parts of digestive tract: Secondary | ICD-10-CM

## 2017-12-13 DIAGNOSIS — I5043 Acute on chronic combined systolic (congestive) and diastolic (congestive) heart failure: Secondary | ICD-10-CM | POA: Diagnosis present

## 2017-12-13 DIAGNOSIS — R410 Disorientation, unspecified: Secondary | ICD-10-CM

## 2017-12-13 DIAGNOSIS — E739 Lactose intolerance, unspecified: Secondary | ICD-10-CM | POA: Diagnosis present

## 2017-12-13 DIAGNOSIS — T380X5A Adverse effect of glucocorticoids and synthetic analogues, initial encounter: Secondary | ICD-10-CM | POA: Diagnosis present

## 2017-12-13 DIAGNOSIS — E860 Dehydration: Secondary | ICD-10-CM | POA: Diagnosis present

## 2017-12-13 DIAGNOSIS — Z6824 Body mass index (BMI) 24.0-24.9, adult: Secondary | ICD-10-CM

## 2017-12-13 HISTORY — DX: Arteritis, unspecified: I77.6

## 2017-12-13 HISTORY — DX: Acute embolism and thrombosis of unspecified deep veins of right lower extremity: I82.401

## 2017-12-13 LAB — URINALYSIS, COMPLETE (UACMP) WITH MICROSCOPIC
Bilirubin Urine: NEGATIVE
Glucose, UA: NEGATIVE mg/dL
Ketones, ur: 20 mg/dL — AB
Leukocytes, UA: NEGATIVE
Nitrite: NEGATIVE
Protein, ur: 100 mg/dL — AB
Specific Gravity, Urine: 1.024 (ref 1.005–1.030)
pH: 5 (ref 5.0–8.0)

## 2017-12-13 LAB — CBC WITH DIFFERENTIAL/PLATELET
Abs Immature Granulocytes: 0.09 10*3/uL — ABNORMAL HIGH (ref 0.00–0.07)
Basophils Absolute: 0 10*3/uL (ref 0.0–0.1)
Basophils Relative: 0 %
Eosinophils Absolute: 0 10*3/uL (ref 0.0–0.5)
Eosinophils Relative: 0 %
HCT: 44.3 % (ref 36.0–46.0)
Hemoglobin: 14.5 g/dL (ref 12.0–15.0)
Immature Granulocytes: 1 %
Lymphocytes Relative: 6 %
Lymphs Abs: 0.8 10*3/uL (ref 0.7–4.0)
MCH: 32.5 pg (ref 26.0–34.0)
MCHC: 32.7 g/dL (ref 30.0–36.0)
MCV: 99.3 fL (ref 80.0–100.0)
Monocytes Absolute: 2.2 10*3/uL — ABNORMAL HIGH (ref 0.1–1.0)
Monocytes Relative: 17 %
Neutro Abs: 10.2 10*3/uL — ABNORMAL HIGH (ref 1.7–7.7)
Neutrophils Relative %: 76 %
Platelets: 201 10*3/uL (ref 150–400)
RBC: 4.46 MIL/uL (ref 3.87–5.11)
RDW: 12.8 % (ref 11.5–15.5)
WBC: 13.4 10*3/uL — ABNORMAL HIGH (ref 4.0–10.5)
nRBC: 0 % (ref 0.0–0.2)

## 2017-12-13 LAB — I-STAT CHEM 8, ED
BUN: 44 mg/dL — ABNORMAL HIGH (ref 8–23)
Calcium, Ion: 0.99 mmol/L — ABNORMAL LOW (ref 1.15–1.40)
Chloride: 103 mmol/L (ref 98–111)
Creatinine, Ser: 1.4 mg/dL — ABNORMAL HIGH (ref 0.44–1.00)
Glucose, Bld: 126 mg/dL — ABNORMAL HIGH (ref 70–99)
HCT: 46 % (ref 36.0–46.0)
Hemoglobin: 15.6 g/dL — ABNORMAL HIGH (ref 12.0–15.0)
Potassium: 4 mmol/L (ref 3.5–5.1)
Sodium: 138 mmol/L (ref 135–145)
TCO2: 27 mmol/L (ref 22–32)

## 2017-12-13 LAB — RAPID URINE DRUG SCREEN, HOSP PERFORMED
Amphetamines: NOT DETECTED
Barbiturates: NOT DETECTED
Benzodiazepines: NOT DETECTED
Cocaine: NOT DETECTED
Opiates: NOT DETECTED
Tetrahydrocannabinol: NOT DETECTED

## 2017-12-13 LAB — COMPREHENSIVE METABOLIC PANEL
ALT: 55 U/L — ABNORMAL HIGH (ref 0–44)
AST: 87 U/L — ABNORMAL HIGH (ref 15–41)
Albumin: 2.8 g/dL — ABNORMAL LOW (ref 3.5–5.0)
Alkaline Phosphatase: 72 U/L (ref 38–126)
Anion gap: 16 — ABNORMAL HIGH (ref 5–15)
BUN: 29 mg/dL — ABNORMAL HIGH (ref 8–23)
CO2: 22 mmol/L (ref 22–32)
Calcium: 8.5 mg/dL — ABNORMAL LOW (ref 8.9–10.3)
Chloride: 101 mmol/L (ref 98–111)
Creatinine, Ser: 1.47 mg/dL — ABNORMAL HIGH (ref 0.44–1.00)
GFR calc Af Amer: 41 mL/min — ABNORMAL LOW (ref >60)
GFR calc non Af Amer: 35 mL/min — ABNORMAL LOW (ref >60)
Glucose, Bld: 126 mg/dL — ABNORMAL HIGH (ref 70–99)
Potassium: 3.7 mmol/L (ref 3.5–5.1)
Sodium: 139 mmol/L (ref 135–145)
Total Bilirubin: 2.3 mg/dL — ABNORMAL HIGH (ref 0.3–1.2)
Total Protein: 6.7 g/dL (ref 6.5–8.1)

## 2017-12-13 LAB — I-STAT CG4 LACTIC ACID, ED
Lactic Acid, Venous: 2.28 mmol/L (ref 0.5–1.9)
Lactic Acid, Venous: 1.52 mmol/L (ref 0.5–1.9)

## 2017-12-13 LAB — CK: Total CK: 2446 U/L — ABNORMAL HIGH (ref 38–234)

## 2017-12-13 LAB — MAGNESIUM: Magnesium: 2.3 mg/dL (ref 1.7–2.4)

## 2017-12-13 LAB — PHOSPHORUS: Phosphorus: 2.1 mg/dL — ABNORMAL LOW (ref 2.5–4.6)

## 2017-12-13 LAB — TSH: TSH: 5.019 u[IU]/mL — ABNORMAL HIGH (ref 0.350–4.500)

## 2017-12-13 LAB — BRAIN NATRIURETIC PEPTIDE: B Natriuretic Peptide: 60.6 pg/mL (ref 0.0–100.0)

## 2017-12-13 MED ORDER — SODIUM CHLORIDE 0.9 % IV BOLUS
1000.0000 mL | Freq: Once | INTRAVENOUS | Status: AC
  Administered 2017-12-13: 1000 mL via INTRAVENOUS

## 2017-12-13 MED ORDER — ONDANSETRON HCL 4 MG PO TABS: 4.0000 mg | ORAL_TABLET | Freq: Four times a day (QID) | ORAL | Status: DC | PRN

## 2017-12-13 MED ORDER — METRONIDAZOLE IN NACL 5-0.79 MG/ML-% IV SOLN
500.0000 mg | Freq: Once | INTRAVENOUS | Status: AC
  Administered 2017-12-13: 500 mg via INTRAVENOUS
  Filled 2017-12-13: qty 100

## 2017-12-13 MED ORDER — METRONIDAZOLE IN NACL 5-0.79 MG/ML-% IV SOLN
500.0000 mg | Freq: Three times a day (TID) | INTRAVENOUS | Status: DC
  Administered 2017-12-13 – 2017-12-14 (×3): 500 mg via INTRAVENOUS
  Filled 2017-12-13 (×4): qty 100

## 2017-12-13 MED ORDER — SODIUM CHLORIDE 0.9 % IV SOLN
2.0000 g | Freq: Once | INTRAVENOUS | Status: AC
  Administered 2017-12-13: 2 g via INTRAVENOUS
  Filled 2017-12-13: qty 20

## 2017-12-13 MED ORDER — PANTOPRAZOLE SODIUM 40 MG PO TBEC
40.0000 mg | DELAYED_RELEASE_TABLET | Freq: Every day | ORAL | Status: DC
  Administered 2017-12-13 – 2017-12-24 (×12): 40 mg via ORAL
  Filled 2017-12-13 (×12): qty 1

## 2017-12-13 MED ORDER — HEPARIN BOLUS VIA INFUSION
4300.0000 [IU] | Freq: Once | INTRAVENOUS | Status: AC
  Administered 2017-12-13: 4300 [IU] via INTRAVENOUS
  Filled 2017-12-13: qty 4300

## 2017-12-13 MED ORDER — LACTATED RINGERS IV SOLN
INTRAVENOUS | Status: DC
  Administered 2017-12-13 – 2017-12-14 (×2): via INTRAVENOUS

## 2017-12-13 MED ORDER — SODIUM CHLORIDE 0.9 % IV SOLN
1.0000 g | INTRAVENOUS | Status: DC
  Administered 2017-12-14: 1 g via INTRAVENOUS
  Filled 2017-12-13: qty 10

## 2017-12-13 MED ORDER — SODIUM CHLORIDE 0.9 % IV SOLN
2.0000 g | INTRAVENOUS | Status: DC
  Filled 2017-12-13: qty 20

## 2017-12-13 MED ORDER — SODIUM CHLORIDE 0.9 % IV SOLN
Freq: Once | INTRAVENOUS | Status: AC
  Administered 2017-12-13: 13:00:00 via INTRAVENOUS

## 2017-12-13 MED ORDER — ONDANSETRON HCL 4 MG/2ML IJ SOLN: 4.0000 mg | Freq: Four times a day (QID) | INTRAMUSCULAR | Status: DC | PRN

## 2017-12-13 MED ORDER — ACETAMINOPHEN 650 MG RE SUPP: 650.0000 mg | Freq: Four times a day (QID) | RECTAL | Status: DC | PRN

## 2017-12-13 MED ORDER — SODIUM CHLORIDE 0.9 % IV SOLN: 500.0000 mg | INTRAVENOUS | Status: DC

## 2017-12-13 MED ORDER — PANCRELIPASE (LIP-PROT-AMYL) 12000-38000 UNITS PO CPEP
24000.0000 [IU] | ORAL_CAPSULE | Freq: Three times a day (TID) | ORAL | Status: DC
  Administered 2017-12-14 (×3): 24000 [IU] via ORAL
  Filled 2017-12-13 (×5): qty 2

## 2017-12-13 MED ORDER — FENTANYL CITRATE (PF) 100 MCG/2ML IJ SOLN
25.0000 ug | INTRAMUSCULAR | Status: AC | PRN
  Administered 2017-12-13 – 2017-12-14 (×3): 25 ug via INTRAVENOUS
  Filled 2017-12-13 (×3): qty 2

## 2017-12-13 MED ORDER — ACETAMINOPHEN 325 MG PO TABS
650.0000 mg | ORAL_TABLET | Freq: Four times a day (QID) | ORAL | Status: DC | PRN
  Administered 2017-12-14: 650 mg via ORAL
  Filled 2017-12-13: qty 2

## 2017-12-13 MED ORDER — IOPAMIDOL (ISOVUE-370) INJECTION 76%
100.0000 mL | Freq: Once | INTRAVENOUS | Status: AC | PRN
  Administered 2017-12-13: 100 mL via INTRAVENOUS

## 2017-12-13 MED ORDER — HEPARIN (PORCINE) IN NACL 100-0.45 UNIT/ML-% IJ SOLN
950.0000 [IU]/h | INTRAMUSCULAR | Status: DC
  Administered 2017-12-13: 1100 [IU]/h via INTRAVENOUS
  Administered 2017-12-14 – 2017-12-15 (×2): 950 [IU]/h via INTRAVENOUS
  Filled 2017-12-13 (×3): qty 250

## 2017-12-13 NOTE — H&P (Addendum)
History and Physical    Ebony Scott:[id number] DOB: [birth year] DOA: 12/13/2017  PCP: Ebony Frizzle, MD  Patient coming from: Home.  I have personally briefly reviewed patient's old medical records in [place]  Chief Complaint: Altered mental status.  HPI: Ebony Scott is a 67 y.o. female with medical history significant of seasonal allergies, anemia, anxiety, depression, asthma, history of breast cancer, history of cataracts, chronic back pain, chronic neck pain, diverticulosis, history history of DVT, fibromyalgia, GERD, Graves' disease, hyperthyroidism, history of headaches, hemorrhoids, hyperlipidemia, IBS, osteoporosis, history of pneumonia who was found by her daughter by her bedside confused, called her in vomit and feces.  She does not remember what happened or having any symptoms prior to losing consciousness.  She was last known to be well on Wednesday.  Today she would not answer the door for her daughter, who eventually was able to get in the house and called EMS.  ED Course: Initial vital signs temperature 100.4 F, pulse 93, respirations 19, blood pressure 174/76 mmHg and O2 sat 95% on room air.  The patient received a 1000 mL NS bolus, ceftriaxone and metronidazole IVPB in the ED.  Blood cultures x2 were drawn.  Her white count was 13.4 with 76% neutrophils, 6% lymphocytes and 17% monocytes.  Hemoglobin 14.5 g/dL and platelets 201.  Lactic acid was 2.28, but subsequently normalized at 1.52 mmol/L.  Troponin was normal, BNP was 60.6 pg/mL.  Total CK was 2446.  CMP shows normal electrolytes when calcium is corrected to albumin.  BUN 29, creatinine 1.47 and glucose 126 mg/dL.  Transaminases are both slightly elevated with AST at 87 and ALT of 55.  Total bilirubin was 2.3 mg/dL.  Chest radiograph did not show any significant abnormality.  CT head did not show any acute intracranial pathology.  Bilateral lower extremities duplex showed RLE DVT.  Review of  Systems: Unable to obtain.   Past Medical History:  Diagnosis Date  . Allergic rhinitis   . Allergy    SEASONAL  . Anemia   . Anxiety    pt denies  . Arthritis    "mostly the hands" (02/20/2012)  . Asthma   . Breast cancer ([place]) 1998   in remission  . Cataract    REMOVED  . Chronic lower back pain   . Chronic neck pain   . Complication of anesthesia    "had hard time waking up from it several times" (02/20/2012)  . Depression    "some; don't take anything for it" (02/20/2012)  . Diverticulosis   . DVT (deep venous thrombosis) ([place])   . Exertional dyspnea   . Fibromyalgia 11/2011  . GERD (gastroesophageal reflux disease)   . Graves disease   . Headache(784.0)    "related to allergies; more at different times during the year" (02/20/2012)  . Hemorrhoids   . Hiatal hernia    back and neck  . Hx of adenomatous colonic polyps 04/12/2016  . Hypercholesteremia    good cholesterol is high  . Hypothyroidism   . IBS (irritable bowel syndrome)   . Moderate persistent asthma    -FeV1 72% 2011, -IgE 102 2011, CT sinus Neg 2011  . Osteoporosis    on reclast yearly  . Pneumonia 04/2011; ~ 11/2011   "double; single" (02/20/2012)    Past Surgical History:  Procedure Laterality Date  . ANTERIOR AND POSTERIOR REPAIR  1990's  . APPENDECTOMY    . BREAST  LUMPECTOMY  1998   left  . CARPOMETACARPEL ([place]) FUSION OF THUMB WITH AUTOGRAFT FROM RADIUS  ~ 2009   "both thumbs" (02/20/2012)  . CATARACT EXTRACTION W/ INTRAOCULAR LENS  IMPLANT, BILATERAL  2012  . CERVICAL DISCECTOMY  10/2001   C5-C6  . CERVICAL FUSION  2003   C3-C4  . CHOLECYSTECTOMY    . COLONOSCOPY    . DEBRIDEMENT TENNIS ELBOW  ?1970's   right  . ESOPHAGOGASTRODUODENOSCOPY    . HYSTERECTOMY    . KNEE ARTHROPLASTY  ?1990's   "?right; w/cartilage repair" (02/20/2012)  . NASAL SEPTUM SURGERY  1980's  . POSTERIOR CERVICAL FUSION/FORAMINOTOMY  2004   "failed initial fusion; rewired  anterior neck" (02/20/2012)  . TONSILLECTOMY  ~  1953  . VESICOVAGINAL FISTULA CLOSURE W/ TAH  1988  . VIDEO BRONCHOSCOPY Bilateral 08/23/2016   Procedure: VIDEO BRONCHOSCOPY WITH FLUORO;  Surgeon: Javier Glazier, MD;  Location: [facility];  Service: Cardiopulmonary;  Laterality: Bilateral;     reports that she is a non-smoker but has been exposed to tobacco smoke. She has never used smokeless tobacco. She reports that she drinks alcohol. She reports that she does not use drugs.  Allergies  Allergen Reactions  . Dust Mite Extract Shortness Of Breath and Other (See Comments)    "sneezing" (02/20/2012)  . Molds & Smuts Shortness Of Breath  . Morphine Sulfate Itching  . Other Shortness Of Breath and Other (See Comments)    Grass and weeds "sneezing; filled sinuses" (02/20/2012)  . Penicillins Rash and Other (See Comments)    "welts" (02/20/2012) Has patient had a PCN reaction causing immediate rash, facial/tongue/throat swelling, SOB or lightheadedness with hypotension: Unknown Has patient had a PCN reaction causing severe rash involving mucus membranes or skin necrosis: No Has patient had a PCN reaction that required hospitalization: No Has patient had a PCN reaction occurring within the last 10 years: No If all of the above answers are "NO", then may proceed with Cephalosporin use.   Marland Kitchen Reclast [Zoledronic Acid] Other (See Comments)    Fever, Put in hospital, dr said it was a reaction from a reaction   . Rofecoxib Swelling    Celebrex/vioxx REACTION: feet swelling  . Shrimp Flavor Anaphylaxis    ALL SHELLFISH  . Tetracycline Hcl Nausea And Vomiting  . Xolair [Omalizumab] Other (See Comments)    Caused Blood clot  . Dilaudid [Hydromorphone Hcl] Itching  . Hydrocodone-Acetaminophen Nausea And Vomiting  . Levofloxacin Other (See Comments)    REACTION: GI upset  . Oxycodone Hcl Nausea And Vomiting  . Paroxetine Nausea And Vomiting    Paxil   . Diltiazem Swelling  . Tree Extract Other (See Comments)    "tested and told I was  allergic to it; never experienced a reaction to it" (02/20/2012)    Family History  Problem Relation Age of Onset  . Allergies Mother   . Heart disease Mother   . Arthritis Mother   . Lung cancer Mother   . Diabetes Mother   . Allergies Father   . Heart disease Father   . Arthritis Father   . Stroke Father   . Colon cancer Unknown        Maternal half aunt/Maternal half uncle  . Colitis Daughter   . Diabetes Maternal Grandfather    Prior to Admission medications   Medication Sig Start Date End Date Taking? Authorizing Provider  acetaminophen (TYLENOL) 650 MG CR tablet Take 1,300 mg by mouth 2 (two) times daily.  [provider]  albuterol (PROAIR HFA) 108 (90 Base) MCG/ACT inhaler Inhale 2 puffs into the lungs every 4 (four) hours as needed for wheezing or shortness of breath. 02/18/17   Ebony Frizzle, MD  Alpha-D-Galactosidase (BEANO PO) Take 1 capsule by mouth 3 (three) times daily with meals.    [provider]  Alpha-Lipoic Acid 600 MG CAPS Take 600 mg by mouth daily.     [provider]  alum & mag hydroxide-simeth (GELUSIL) 200-200-25 MG chewable tablet Chew 1 tablet by mouth every 6 (six) hours as needed for flatulence.    [provider]  atorvastatin (LIPITOR) 40 MG tablet Take 1 tablet (40 mg total) by mouth daily. 08/19/17   Ebony Frizzle, MD  azelastine (ASTELIN) 0.1 % nasal spray USE 2 SPRAYS IN EACH NOSTRIL DAILY AS DIRECTED Patient taking differently: Place 2 sprays into both nostrils daily.  07/16/17   Kozlow, Donnamarie Poag, MD  calcium citrate-vitamin D (CITRACAL+D) 315-200 MG-UNIT tablet Take 1 tablet by mouth 2 (two) times daily.    [provider]  cetirizine (ZYRTEC) 10 MG tablet Take 5 mg by mouth every evening.     [provider]  Cholecalciferol (VITAMIN D3) 10000 units TABS Take 1,000 Units by mouth daily.    [provider]  Colchicine 0.6 MG CAPS TAKE 1 CAPSULE BY MOUTH DAILY AS NEEDED Patient  taking differently: Take 0.6 mg by mouth daily.  11/25/17   Ebony Frizzle, MD  CREON 24000-76000 units CPEP TAKE 1 CAPSULE BY MOUTH THREE TIMES DAILY (WITH MEALS) Patient taking differently: Take 1 capsule by mouth 3 (three) times daily with meals.  02/11/17   Ebony Frizzle, MD  cyclobenzaprine (FLEXERIL) 5 MG tablet Take 1 tablet (5 mg total) by mouth 3 (three) times daily as needed for muscle spasms. 12/12/17   Ebony Frizzle, MD  cyproheptadine (PERIACTIN) 4 MG tablet TAKE 2 TABLETS BY MOUTH EVERY EVENING Patient taking differently: Take 8 mg by mouth every evening.  07/16/17   Kozlow, Donnamarie Poag, MD  denosumab (PROLIA) 60 MG/ML SOLN injection Inject 60 mg into the skin every 6 (six) months. Administer in upper arm, thigh, or abdomen    [provider]  dexlansoprazole (DEXILANT) 60 MG capsule Take 1 capsule (60 mg total) by mouth daily. 07/16/17   Kozlow, Donnamarie Poag, MD  diclofenac sodium (VOLTAREN) 1 % GEL Apply 2 g topically 4 (four) times daily. 12/05/17   Ebony Frizzle, MD  EPINEPHrine (EPIPEN 2-PAK) 0.3 mg/0.3 mL IJ SOAJ injection Inject 0.3 mLs (0.3 mg total) into the muscle Once PRN for up to 1 dose. Patient taking differently: Inject 0.3 mg into the muscle Once PRN (for allergic reaction).  12/02/17   Juanito Doom, MD  gabapentin (NEURONTIN) 600 MG tablet TAKE 1 TABLET BY MOUTH DAILY Patient taking differently: Take 600 mg by mouth daily.  02/18/17   Ebony Frizzle, MD  guaifenesin (HUMIBID E) 400 MG TABS tablet Take 400 mg by mouth 2 (two) times daily.    [provider]  hydrocortisone (ANUSOL-HC) 2.5 % rectal cream APPLY AS NEEDED AS DIRECTED Patient taking differently: Place 1 application rectally as needed for hemorrhoids.  06/28/17   Ebony Frizzle, MD  IRON PO Take 325 mg by mouth daily.  07/29/17   [provider]  Lactase (LACTAID FAST ACT) 9000 units TABS Take 18,000 Units by mouth 4 (four) times daily as needed (DAIRY).    [provider]  lidocaine (LIDODERM) 5 % Place 1 patch onto the skin daily. Remove & Discard patch within 12 hours or as directed by MD 08/21/16   Ebony Frizzle, MD  methimazole (TAPAZOLE) 5 MG tablet TAKE 1 TABLET BY MOUTH 3 TIMES DAILY Patient taking differently: Take 5 mg by mouth 3 (three) times daily.  09/30/17   Ebony Frizzle, MD  mirtazapine (REMERON SOL-TAB) 30 MG disintegrating tablet DISOLVE 1 TABLET BY MOUTH DAILY AT BEDTIME Patient taking differently: Take 30 mg by mouth at bedtime.  06/25/17   Gatha Mayer, MD  mometasone (NASONEX) 50 MCG/ACT nasal spray Place 2 sprays into the nose 2 (two) times daily. Patient taking differently: Place 2 sprays into the nose daily.  05/22/17   Juanito Doom, MD  mometasone-formoterol (DULERA) 200-5 MCG/ACT AERO Inhale 2 puffs into the lungs 2 (two) times daily. Patient taking differently: Inhale 1 puff into the lungs 2 (two) times daily.  07/16/17   Kozlow, Donnamarie Poag, MD  montelukast (SINGULAIR) 10 MG tablet Take 1 tablet (10 mg total) by mouth daily. 07/16/17   Kozlow, Donnamarie Poag, MD  mupirocin ointment (BACTROBAN) 2 % APPLY IN THE NOSE 3 TIMES DAILY AS NEEDED Patient taking differently: Place 1 application into the nose 3 (three) times daily as needed.  07/16/17   Kozlow, Donnamarie Poag, MD  nystatin (MYCOSTATIN) 100000 UNIT/ML suspension USE AS DIRECTED 5MLS IN MOUTH OR THROAT 4 TIMES DAILY Patient taking differently: Use as directed 5 mLs in the mouth or throat 4 (four) times daily.  10/23/17   Ebony Frizzle, MD  nystatin-triamcinolone ointment [facility]) Apply 1 application topically 3 (three) times daily as needed (YEAST INFECTION).     [provider]  Prenatal Vit-Sel-Fe Fum-FA (VINATE M) 27-1 MG TABS Take 1 tablet by mouth daily. 07/26/17   Ebony Frizzle, MD  PRESCRIPTION MEDICATION Place 2 puffs into both ears daily as needed. Pt has compounded capsule (powder) that she uses In her ears as needed; CS (X) AHCL    CHLORAMP/SULFU/AMPHO B/ H  DROC 100-100-5 mg capsule    [provider]  Probiotic Product (ALIGN) 4 MG CAPS Take 1 tablet by mouth daily.     [provider]  Propylene Glycol-Glycerin (SOOTHE OP) Place 1 drop into both eyes 2 (two) times daily.     [provider]  ranitidine (ZANTAC) 300 MG tablet Take 1 tablet (300 mg total) by mouth daily. 07/16/17   Kozlow, Donnamarie Poag, MD  Respiratory Therapy Supplies (FLUTTER) DEVI Use as directed 10/22/14   Elsie Stain, MD  SIMPLY SALINE NA Place 1 spray into both nostrils at bedtime.    [provider]  Spacer/Aero-Holding Chambers (AEROCHAMBER MV) inhaler Use as instructed 01/23/16   Javier Glazier, MD  Tiotropium Bromide Monohydrate (SPIRIVA RESPIMAT) 2.5 MCG/ACT AERS Inhale 2 puffs into the lungs daily. 07/16/17   Jiles Prows, MD    Physical Exam: Vitals:   12/13/17 1500 12/13/17 1600 12/13/17 1630 12/13/17 1701  BP: (!) 179/76 (!) 168/72 (!) 176/77 (!) 164/71  Pulse: 94 90 96 99  Resp: (!) 21 20 (!) 27 17  Temp:      TempSrc:      SpO2: 97% 96% 92% 98%  Weight:      Height:        Constitutional: NAD, calm, comfortable Eyes: PERRL, lids and conjunctivae normal ENMT: Mucous membranes and lips are very dry. Posterior pharynx clear of any exudate or lesions.  Neck: normal, supple, no masses, no thyromegaly Respiratory: Decreased breath sounds on bases, otherwise clear to auscultation bilaterally, no wheezing, no crackles. Normal respiratory effort. No accessory muscle use.  Cardiovascular: Regular rate and rhythm, no murmurs / rubs / gallops. No extremity edema. 2+ pedal pulses. No carotid bruits.  Abdomen: Soft, mild LLQ tenderness, no guarding/rebound/masses palpated. No hepatosplenomegaly. Bowel sounds positive.  Musculoskeletal: no clubbing / cyanosis. Good ROM, no contractures. Normal muscle tone.  Skin: Right shoulder erythema.  No rashes, lesions, ulcers. No induration on very limited dermatological examination.  Unable to  fully evaluate due to weakness. Neurologic: CN 2-12 grossly intact. Sensation intact, DTR normal. Strength 5/5 in all 4.  Psychiatric:  Alert and oriented x 2, partially oriented to time and situation.  Improving from earlier.  She knows who is the president.    Labs on Admission: I have personally reviewed following labs and imaging studies  CBC: Recent Labs  Lab 12/13/17 1051 12/13/17 1058  WBC 13.4*  --   NEUTROABS 10.2*  --   HGB 14.5 15.6*  HCT 44.3 46.0  MCV 99.3  --   PLT 201  --    Basic Metabolic Panel: Recent Labs  Lab 12/13/17 1051 12/13/17 1058  NA 139 138  K 3.7 4.0  CL 101 103  CO2 22  --   GLUCOSE 126* 126*  BUN 29* 44*  CREATININE 1.47* 1.40*  CALCIUM 8.5*  --    GFR: Estimated Creatinine Clearance: 32.6 mL/min (A) (by C-G formula based on SCr of 1.4 mg/dL (H)). Liver Function Tests: Recent Labs  Lab 12/13/17 1051  AST 87*  ALT 55*  ALKPHOS 72  BILITOT 2.3*  PROT 6.7  ALBUMIN 2.8*   No results for input(s): LIPASE, AMYLASE in the last 168 hours. No results for input(s): AMMONIA in the last 168 hours. Coagulation Profile: No results for input(s): INR, PROTIME in the last 168 hours. Cardiac Enzymes: Recent Labs  Lab 12/13/17 1051  CKTOTAL 2,446*   BNP (last 3 results) No results for input(s): PROBNP in the last 8760 hours. HbA1C: No results for input(s): HGBA1C in the last 72 hours. CBG: No results for input(s): GLUCAP in the last 168 hours. Lipid Profile: No results for input(s): CHOL, HDL, LDLCALC, TRIG, CHOLHDL, LDLDIRECT in the last 72 hours. Thyroid Function Tests: Recent Labs    12/13/17 1154  TSH 5.019*   Anemia Panel: No results for input(s): VITAMINB12, FOLATE, FERRITIN, TIBC, IRON, RETICCTPCT in the last 72 hours. Urine analysis:    Component Value Date/Time   COLORURINE AMBER (A) 12/13/2017 1441   APPEARANCEUR HAZY (A) 12/13/2017 1441   LABSPEC 1.024 12/13/2017 1441   PHURINE 5.0 12/13/2017 1441   GLUCOSEU NEGATIVE  12/13/2017 1441   HGBUR LARGE (A) 12/13/2017 1441   BILIRUBINUR NEGATIVE 12/13/2017 1441   KETONESUR 20 (A) 12/13/2017 1441   PROTEINUR 100 (A) 12/13/2017 1441   UROBILINOGEN 0.2 05/17/2014 1113   NITRITE NEGATIVE 12/13/2017 1441   LEUKOCYTESUR NEGATIVE 12/13/2017 1441    Radiological Exams on Admission: Ct Head Wo Contrast  Result Date: 12/13/2017 CLINICAL DATA:  Altered level of consciousness, unexplained EXAM: CT HEAD WITHOUT CONTRAST TECHNIQUE: Contiguous axial images were obtained from the base of the skull through the vertex without intravenous contrast. COMPARISON:  Brain MRI 12/17/2012 FINDINGS: Brain: No evidence of acute infarction, hemorrhage, hydrocephalus, extra-axial collection or mass lesion/mass effect. Cerebral volume loss, central predominant. Mild chronic small vessel ischemic change in the cerebral white matter. Vascular: No hyperdense vessel  or unexpected calcification. Skull: Negative Sinuses/Orbits: No acute finding.  Bilateral cataract resection. IMPRESSION: No acute or reversible finding. Mild atrophy and chronic small vessel ischemia. Electronically Signed   By: Monte Fantasia M.D.   On: 12/13/2017 13:13   Dg Chest Portable 1 View  Result Date: 12/13/2017 CLINICAL DATA:  Cough.  The patient was found down. EXAM: PORTABLE CHEST 1 VIEW COMPARISON:  Chest x-ray dated 05/15/2017 and chest CT dated 08/05/2017 FINDINGS: Heart size and vascularity are normal. No infiltrates or effusions. Slight elevation of the right hemidiaphragm without visible atelectasis. No bone abnormality. IMPRESSION: No significant abnormality. Electronically Signed   By: Lorriane Shire M.D.   On: 12/13/2017 11:57   08/05/2017 echocardiogram complete  ------------------------------------------------------------------- LV EF: 65% -   70%  ------------------------------------------------------------------- Indications:      SOB  (R06.02).  ------------------------------------------------------------------- History:   PMH:  Abnormal PFT.  ------------------------------------------------------------------- Study Conclusions  - Left ventricle: The cavity size was normal. There was mild focal   basal hypertrophy of the septum. Systolic function was vigorous.   The estimated ejection fraction was in the range of 65% to 70%.   Wall motion was normal; there were no regional wall motion   abnormalities. Doppler parameters are consistent with abnormal   left ventricular relaxation (grade 1 diastolic dysfunction).  Impressions:  - Definity used; vigorous LV systolic function; mild diastolic   dysfunction.  EKG: Independently reviewed.  Vent. rate 103 BPM PR interval * ms QRS duration 83 ms QT/QTc 335/439 ms P-R-T axes 55 21 -22 Sinus tachycardia Probable left atrial enlargement Anterior infarct, old Borderline repolarization abnormality  Assessment/Plan Principal Problem:   Sepsis due to undetermined organism ([place]) Suspicion for aspiration pneumonia. There is also suspicion for PE, CTA chest pending. Supplemental oxygen as needed. Continue IV fluids. Continue ceftriaxone and metronidazole for suspected aspiration. Monitor intake and output. Follow-up blood cultures and sensitivity.  Active Problems:   Acute deep vein thrombosis (DVT) of right lower extremity (HCC) Awaiting for CTA chest. Heparin infusion started.    Rhabdomyolysis Continue IV fluids. Monitor daily weights, intake and output. Consider furosemide as needed. Hold statin.    Chronic diastolic CHF (congestive heart failure) (HCC) No signs of decompensation at this time as the patient is actually dehydrated. Monitor daily weights, intake and output. Judicious use of IVF given rhabdomyolysis. Consider using furosemide as needed.    Hyperlipidemia Hold statin due to rhabdomyolysis.    GERD Famotidine 20 mg p.o. twice daily  once more alert. Protonix 40 mg p.o. daily once more alert.    Pseudogout Resume colchicine once more alert.    Hyperthyroidism Resume methimazole once more alert.    Hyperbilirubinemia Check RUQ ultrasound.     DVT prophylaxis: On heparin infusion. Code Status: Full code. Family Communication: Her daughter Cyril Mourning was present in the ED room. Disposition Plan: Admit for sepsis treatment, possible aspiration and rhabdomyolysis. Consults called: Admission status: Inpatient/stepdown.   Reubin Milan MD [facility] Pager [phone]  If 7PM-7AM, please contact night-coverage [email or web address] Password [email or web address]  12/13/2017, 5:12 PM

## 2017-12-13 NOTE — ED Notes (Signed)
Patient transported to CT 

## 2017-12-13 NOTE — ED Provider Notes (Signed)
[facility] EMERGENCY DEPARTMENT Provider Note   CSN: [id number] Arrival date & time: 12/13/17  6503     History   Chief Complaint Chief Complaint  Patient presents with  . Fall   Level 5 caveat due to altered mental status  HPI Ebony Scott is a 67 y.o. female with a PMH significant for anemia and Graves' disease who presents with altered mental status.  Her daughter reports that [name] last texted a friend of her daughter's on Tuesday night saying that her leg hurt.  Her daughter went to check on her this morning, and [name] did not answer as she was knocking on the door.  She called the alarm company, which enabled her to get into the house.  [name] was found on the floor beside her bed covered in vomit, urine, and feces.  She did not remember the events of the last 60 hours.  Patient's daughter noted abrasions along patient's bilateral posterior shoulders and back corresponding to the areas where she was lying on the floor.  No other signs of trauma were found.  Since then, patient has had varying levels of orientation but has been alert although sleepy.  She is reporting a cough and shoulder and back pain but no other symptoms.  Patient's daughter reports that [name] is fully functional at baseline, lives alone, and performs all IADLs independently.  She says this has never happened before, although she had significant weakness with some altered mentation when she had pneumonia in 2018.  Patient's daughter denies that her mother uses any drugs or alcohol.  She takes Flexeril at home, but does not use any opioids or benzodiazepines.    Past Medical History:  Diagnosis Date  . Allergic rhinitis   . Allergy    SEASONAL  . Anemia   . Anxiety    pt denies  . Arthritis    "mostly the hands" (02/20/2012)  . Asthma   . Breast cancer ([place]) 1998   in remission  . Cataract    REMOVED  . Chronic lower back pain   . Chronic neck pain   . Complication of  anesthesia    "had hard time waking up from it several times" (02/20/2012)  . Depression    "some; don't take anything for it" (02/20/2012)  . Diverticulosis   . DVT (deep venous thrombosis) ([place])   . Exertional dyspnea   . Fibromyalgia 11/2011  . GERD (gastroesophageal reflux disease)   . Graves disease   . Headache(784.0)    "related to allergies; more at different times during the year" (02/20/2012)  . Hemorrhoids   . Hiatal hernia    back and neck  . Hx of adenomatous colonic polyps 04/12/2016  . Hypercholesteremia    good cholesterol is high  . Hypothyroidism   . IBS (irritable bowel syndrome)   . Moderate persistent asthma    -FeV1 72% 2011, -IgE 102 2011, CT sinus Neg 2011  . Osteoporosis    on reclast yearly  . Pneumonia 04/2011; ~ 11/2011   "double; single" (02/20/2012)    Patient Active Problem List   Diagnosis Date Noted  . Sepsis due to undetermined organism ([place]) 12/13/2017  . Multiple lung nodules on CT   . CKD (chronic kidney disease), stage III ([place]) 04/15/2016  . Hx of adenomatous colonic polyps 04/12/2016  . Cough 02/09/2016  . Opacity of lung on imaging study 11/03/2015  . Severe persistent asthma 02/08/2015  . Facial pain  syndrome 02/08/2015  . Insomnia 02/08/2015  . Other allergic rhinitis 02/08/2015  . LPRD (laryngopharyngeal reflux disease) 02/08/2015  . Chronic diastolic CHF (congestive heart failure) ([place]) 02/08/2015  . Nausea without vomiting 04/08/2014  . Benign paroxysmal positional vertigo 12/03/2012  . Dizziness and giddiness 10/29/2012  . Pseudogout 02/24/2012  . Pneumonia 05/31/2011  . IRRITABLE BOWEL SYNDROME 01/02/2010  . HYPERLIPIDEMIA 12/23/2009  . Hyperthyroidism 11/12/2006  . Seasonal and perennial allergic rhinitis 11/12/2006  . GERD 11/12/2006  . NECK PAIN, CHRONIC 11/12/2006  . OSTEOPOROSIS 11/12/2006  . BREAST CANCER, HX OF 11/12/2006    Past Surgical History:  Procedure Laterality Date  . ANTERIOR AND POSTERIOR REPAIR  1990's   . APPENDECTOMY    . BREAST LUMPECTOMY  1998   left  . CARPOMETACARPEL ([place]) FUSION OF THUMB WITH AUTOGRAFT FROM RADIUS  ~ 2009   "both thumbs" (02/20/2012)  . CATARACT EXTRACTION W/ INTRAOCULAR LENS  IMPLANT, BILATERAL  2012  . CERVICAL DISCECTOMY  10/2001   C5-C6  . CERVICAL FUSION  2003   C3-C4  . CHOLECYSTECTOMY    . COLONOSCOPY    . DEBRIDEMENT TENNIS ELBOW  ?1970's   right  . ESOPHAGOGASTRODUODENOSCOPY    . HYSTERECTOMY    . KNEE ARTHROPLASTY  ?1990's   "?right; w/cartilage repair" (02/20/2012)  . NASAL SEPTUM SURGERY  1980's  . POSTERIOR CERVICAL FUSION/FORAMINOTOMY  2004   "failed initial fusion; rewired  anterior neck" (02/20/2012)  . TONSILLECTOMY  ~ 1953  . VESICOVAGINAL FISTULA CLOSURE W/ TAH  1988  . VIDEO BRONCHOSCOPY Bilateral 08/23/2016   Procedure: VIDEO BRONCHOSCOPY WITH FLUORO;  Surgeon: Javier Glazier, MD;  Location: [facility];  Service: Cardiopulmonary;  Laterality: Bilateral;     OB History   None      Home Medications    Prior to Admission medications   Medication Sig Start Date End Date Taking? Authorizing Provider  acetaminophen (TYLENOL) 650 MG CR tablet Take 1,300 mg by mouth 2 (two) times daily.    [provider]  albuterol (PROAIR HFA) 108 (90 Base) MCG/ACT inhaler Inhale 2 puffs into the lungs every 4 (four) hours as needed for wheezing or shortness of breath. 02/18/17   [name], MD  Alpha-D-Galactosidase (BEANO PO) Take 1 capsule by mouth 3 (three) times daily with meals.    [provider]  Alpha-Lipoic Acid 600 MG CAPS Take 600 mg by mouth daily.     [provider]  alum & mag hydroxide-simeth (GELUSIL) 200-200-25 MG chewable tablet Chew 1 tablet by mouth every 6 (six) hours as needed for flatulence.    [provider]  atorvastatin (LIPITOR) 40 MG tablet Take 1 tablet (40 mg total) by mouth daily. 08/19/17   [name], MD  azelastine (ASTELIN) 0.1 % nasal spray USE 2 SPRAYS IN EACH  NOSTRIL DAILY AS DIRECTED Patient taking differently: Place 2 sprays into both nostrils daily.  07/16/17   Kozlow, Donnamarie Poag, MD  calcium citrate-vitamin D (CITRACAL+D) 315-200 MG-UNIT tablet Take 1 tablet by mouth 2 (two) times daily.    [provider]  cetirizine (ZYRTEC) 10 MG tablet Take 5 mg by mouth every evening.     [provider]  Cholecalciferol (VITAMIN D3) 10000 units TABS Take 1,000 Units by mouth daily.    [provider]  Colchicine 0.6 MG CAPS TAKE 1 CAPSULE BY MOUTH DAILY AS NEEDED Patient taking differently: Take 0.6 mg by mouth daily.  11/25/17   [name], MD  CREON 24000-76000 units CPEP TAKE 1 CAPSULE BY MOUTH THREE TIMES DAILY (WITH MEALS) Patient taking differently: Take 1 capsule by mouth 3 (three) times daily with meals.  02/11/17   [name], MD  cyclobenzaprine (FLEXERIL) 5 MG tablet Take 1 tablet (5 mg total) by mouth 3 (three) times daily as needed for muscle spasms. 12/12/17   [name], MD  cyproheptadine (PERIACTIN) 4 MG tablet TAKE 2 TABLETS BY MOUTH EVERY EVENING Patient taking differently: Take 8 mg by mouth every evening.  07/16/17   Kozlow, Donnamarie Poag, MD  denosumab (PROLIA) 60 MG/ML SOLN injection Inject 60 mg into the skin every 6 (six) months. Administer in upper arm, thigh, or abdomen    [provider]  dexlansoprazole (DEXILANT) 60 MG capsule Take 1 capsule (60 mg total) by mouth daily. 07/16/17   Kozlow, Donnamarie Poag, MD  diclofenac sodium (VOLTAREN) 1 % GEL Apply 2 g topically 4 (four) times daily. 12/05/17   [name], MD  EPINEPHrine (EPIPEN 2-PAK) 0.3 mg/0.3 mL IJ SOAJ injection Inject 0.3 mLs (0.3 mg total) into the muscle Once PRN for up to 1 dose. Patient taking differently: Inject 0.3 mg into the muscle Once PRN (for allergic reaction).  12/02/17   Juanito Doom, MD  gabapentin (NEURONTIN) 600 MG tablet TAKE 1 TABLET BY MOUTH DAILY Patient taking differently: Take 600 mg by mouth daily.   02/18/17   [name], MD  guaifenesin (HUMIBID E) 400 MG TABS tablet Take 400 mg by mouth 2 (two) times daily.    [provider]  hydrocortisone (ANUSOL-HC) 2.5 % rectal cream APPLY AS NEEDED AS DIRECTED Patient taking differently: Place 1 application rectally as needed for hemorrhoids.  06/28/17   [name], MD  IRON PO Take 325 mg by mouth daily.  07/29/17   [provider]  Lactase (LACTAID FAST ACT) 9000 units TABS Take 18,000 Units by mouth 4 (four) times daily as needed (DAIRY).    [provider]  lidocaine (LIDODERM) 5 % Place 1 patch onto the skin daily. Remove & Discard patch within 12 hours or as directed by MD 08/21/16   [name], MD  methimazole (TAPAZOLE) 5 MG tablet TAKE 1 TABLET BY MOUTH 3 TIMES DAILY Patient taking differently: Take 5 mg by mouth 3 (three) times daily.  09/30/17   [name], MD  mirtazapine (REMERON SOL-TAB) 30 MG disintegrating tablet DISOLVE 1 TABLET BY MOUTH DAILY AT BEDTIME Patient taking differently: Take 30 mg by mouth at bedtime.  06/25/17   Gatha Mayer, MD  mometasone (NASONEX) 50 MCG/ACT nasal spray Place 2 sprays into the nose 2 (two) times daily. Patient taking differently: Place 2 sprays into the nose daily.  05/22/17   Juanito Doom, MD  mometasone-formoterol (DULERA) 200-5 MCG/ACT AERO Inhale 2 puffs into the lungs 2 (two) times daily. Patient taking differently: Inhale 1 puff into the lungs 2 (two) times daily.  07/16/17   Kozlow, Donnamarie Poag, MD  montelukast (SINGULAIR) 10 MG tablet Take 1 tablet (10 mg total) by mouth daily. 07/16/17   Kozlow, Donnamarie Poag, MD  mupirocin ointment (BACTROBAN) 2 % APPLY IN THE NOSE 3 TIMES DAILY AS NEEDED Patient taking differently: Place 1 application into the nose 3 (three) times daily as needed.  07/16/17   Kozlow, Donnamarie Poag, MD  nystatin (MYCOSTATIN) 100000 UNIT/ML suspension USE AS DIRECTED 5MLS IN MOUTH OR THROAT 4 TIMES DAILY Patient taking differently: Use as  directed  5 mLs in the mouth or throat 4 (four) times daily.  10/23/17   [name], MD  nystatin-triamcinolone ointment [facility]) Apply 1 application topically 3 (three) times daily as needed (YEAST INFECTION).     [provider]  Prenatal Vit-Sel-Fe Fum-FA (VINATE M) 27-1 MG TABS Take 1 tablet by mouth daily. 07/26/17   [name], MD  PRESCRIPTION MEDICATION Place 2 puffs into both ears daily as needed. Pt has compounded capsule (powder) that she uses In her ears as needed; CS (X) AHCL    CHLORAMP/SULFU/AMPHO B/ H DROC 100-100-5 mg capsule    [provider]  Probiotic Product (ALIGN) 4 MG CAPS Take 1 tablet by mouth daily.     [provider]  Propylene Glycol-Glycerin (SOOTHE OP) Place 1 drop into both eyes 2 (two) times daily.     [provider]  ranitidine (ZANTAC) 300 MG tablet Take 1 tablet (300 mg total) by mouth daily. 07/16/17   Kozlow, Donnamarie Poag, MD  Respiratory Therapy Supplies (FLUTTER) DEVI Use as directed 10/22/14   Elsie Stain, MD  SIMPLY SALINE NA Place 1 spray into both nostrils at bedtime.    [provider]  Spacer/Aero-Holding Chambers (AEROCHAMBER MV) inhaler Use as instructed 01/23/16   Javier Glazier, MD  Tiotropium Bromide Monohydrate (SPIRIVA RESPIMAT) 2.5 MCG/ACT AERS Inhale 2 puffs into the lungs daily. 07/16/17   Kozlow, Donnamarie Poag, MD    Family History Family History  Problem Relation Age of Onset  . Allergies Mother   . Heart disease Mother   . Arthritis Mother   . Lung cancer Mother   . Diabetes Mother   . Allergies Father   . Heart disease Father   . Arthritis Father   . Stroke Father   . Colon cancer Unknown        Maternal half aunt/Maternal half uncle  . Colitis Daughter   . Diabetes Maternal Grandfather     Social History Social History   Tobacco Use  . Smoking status: Passive Smoke Exposure - Never Smoker  . Smokeless tobacco: Never Used  . Tobacco comment: Parents  Substance Use  Topics  . Alcohol use: Yes    Alcohol/week: 0.0 standard drinks    Comment: 02/20/2012 "couple glasses of wine/6 months"  . Drug use: No     Allergies   Dust mite extract; Molds & smuts; Morphine sulfate; Other; Penicillins; Reclast [zoledronic acid]; Rofecoxib; Shrimp flavor; Tetracycline hcl; Xolair [omalizumab]; Dilaudid [hydromorphone hcl]; Hydrocodone-acetaminophen; Levofloxacin; Oxycodone hcl; Paroxetine; Diltiazem; and Tree extract   Review of Systems Review of Systems  Constitutional: Positive for activity change, chills, fatigue and fever.  HENT: Negative for congestion and rhinorrhea.   Respiratory: Positive for cough and shortness of breath.   Cardiovascular: Negative for chest pain.  Gastrointestinal: Positive for vomiting. Negative for abdominal pain.  Genitourinary: Negative for dysuria.  Musculoskeletal: Positive for back pain.  Neurological: Negative for speech difficulty.  Hematological: Bruises/bleeds easily.  Psychiatric/Behavioral: Positive for confusion.     Physical Exam Updated Vital Signs BP (!) 179/81   Pulse 97   Temp (!) 100.4 F (38 C) (Rectal)   Resp (!) 24   Ht 5\' 2"  (1.575 m)   Wt 61.2 kg   SpO2 95%   BMI 24.69 kg/m   Physical Exam  Constitutional: She appears well-developed and well-nourished. No distress.  HENT:  Head: Normocephalic and atraumatic.  Right Ear: External ear normal.  Left Ear: External ear normal.  Nose: Nose  normal.  Mouth/Throat: No oropharyngeal exudate.  Dry mucous membranes  Eyes: Pupils are equal, round, and reactive to light. Conjunctivae and EOM are normal. Right eye exhibits no discharge. Left eye exhibits no discharge. No scleral icterus.  Neck: Normal range of motion. No JVD present. No thyromegaly present.  Cardiovascular: Normal rate, regular rhythm, normal heart sounds and intact distal pulses.  No murmur heard. Homans sign positive on the right, no redness or swelling of right or left leg    Pulmonary/Chest: Effort normal. No respiratory distress. She has no wheezes. She has rales.  Abdominal: Soft. Bowel sounds are normal. She exhibits no distension. There is no tenderness.  Musculoskeletal: Normal range of motion. She exhibits no edema, tenderness or deformity.  Neurological: She is disoriented. No cranial nerve deficit or sensory deficit.  Full exam inhibited by patient's altered mental status.  Patient alert to self and partly to place.  Not oriented to time.  Frequently sleepy, intermittently alert.  Skin: Skin is warm and dry.  Bruising and superficial abrasions along patient's back and bilateral shoulders.  Psychiatric: She is slowed. Cognition and memory are impaired. She exhibits abnormal recent memory. She is inattentive.     ED Treatments / Results  Labs (all labs ordered are listed, but only abnormal results are displayed) Labs Reviewed  COMPREHENSIVE METABOLIC PANEL - Abnormal; Notable for the following components:      Result Value   Glucose, Bld 126 (*)    BUN 29 (*)    Creatinine, Ser 1.47 (*)    Calcium 8.5 (*)    Albumin 2.8 (*)    AST 87 (*)    ALT 55 (*)    Total Bilirubin 2.3 (*)    GFR calc non Af Amer 35 (*)    GFR calc Af Amer 41 (*)    Anion gap 16 (*)    All other components within normal limits  CBC WITH DIFFERENTIAL/PLATELET - Abnormal; Notable for the following components:   WBC 13.4 (*)    Neutro Abs 10.2 (*)    Monocytes Absolute 2.2 (*)    Abs Immature Granulocytes 0.09 (*)    All other components within normal limits  CK - Abnormal; Notable for the following components:   Total CK 2,446 (*)    All other components within normal limits  TSH - Abnormal; Notable for the following components:   TSH 5.019 (*)    All other components within normal limits  I-STAT CG4 LACTIC ACID, ED - Abnormal; Notable for the following components:   Lactic Acid, Venous 2.28 (*)    All other components within normal limits  I-STAT CHEM 8, ED -  Abnormal; Notable for the following components:   BUN 44 (*)    Creatinine, Ser 1.40 (*)    Glucose, Bld 126 (*)    Calcium, Ion 0.99 (*)    Hemoglobin 15.6 (*)    All other components within normal limits  URINE CULTURE  CULTURE, BLOOD (ROUTINE X 2)  CULTURE, BLOOD (ROUTINE X 2)  RAPID URINE DRUG SCREEN, HOSP PERFORMED  URINALYSIS, COMPLETE (UACMP) WITH MICROSCOPIC  BRAIN NATRIURETIC PEPTIDE  MAGNESIUM  PHOSPHORUS  I-STAT CG4 LACTIC ACID, ED    EKG None  Radiology Ct Head Wo Contrast  Result Date: 12/13/2017 CLINICAL DATA:  Altered level of consciousness, unexplained EXAM: CT HEAD WITHOUT CONTRAST TECHNIQUE: Contiguous axial images were obtained from the base of the skull through the vertex without intravenous contrast. COMPARISON:  Brain MRI 12/17/2012 FINDINGS: Brain:  No evidence of acute infarction, hemorrhage, hydrocephalus, extra-axial collection or mass lesion/mass effect. Cerebral volume loss, central predominant. Mild chronic small vessel ischemic change in the cerebral white matter. Vascular: No hyperdense vessel or unexpected calcification. Skull: Negative Sinuses/Orbits: No acute finding.  Bilateral cataract resection. IMPRESSION: No acute or reversible finding. Mild atrophy and chronic small vessel ischemia. Electronically Signed   By: Monte Fantasia M.D.   On: 12/13/2017 13:13   Dg Chest Portable 1 View  Result Date: 12/13/2017 CLINICAL DATA:  Cough.  The patient was found down. EXAM: PORTABLE CHEST 1 VIEW COMPARISON:  Chest x-ray dated 05/15/2017 and chest CT dated 08/05/2017 FINDINGS: Heart size and vascularity are normal. No infiltrates or effusions. Slight elevation of the right hemidiaphragm without visible atelectasis. No bone abnormality. IMPRESSION: No significant abnormality. Electronically Signed   By: Lorriane Shire M.D.   On: 12/13/2017 11:57    Procedures Procedures (including critical care time)  Medications Ordered in ED Medications  sodium chloride  0.9 % bolus 1,000 mL (0 mLs Intravenous Stopped 12/13/17 1255)  0.9 %  sodium chloride infusion ( Intravenous New Bag/Given 12/13/17 1302)  cefTRIAXone (ROCEPHIN) 2 g in sodium chloride 0.9 % 100 mL IVPB (0 g Intravenous Stopped 12/13/17 1412)  metroNIDAZOLE (FLAGYL) IVPB 500 mg (500 mg Intravenous New Bag/Given 12/13/17 1405)     Initial Impression / Assessment and Plan / ED Course  I have reviewed the triage vital signs and the nursing notes.  Pertinent labs & imaging results that were available during my care of the patient were reviewed by me and considered in my medical decision making (see chart for details).     Also patient's altered mental status is broad, but given her previous emesis, frequent cough, crackles heard on lung exam, and fever, we will cover for aspiration pneumonia with ceftriaxone and Flagyl, since patient has a penicillin allergy and cannot tolerate Zosyn.  Head CT is negative for acute abnormality.  CMP, CBC, CK, and lactic acid are consistent with mild rhabdomyolysis and dehydration.  We will follow UA, urine culture, blood cultures, and UDS.  Unfortunately, the antibiotics were administered before the urine was collected, so urine studies may be less helpful.  Given patient's daughter's report of patient mentioning leg pain a few days ago and positive Homans sign on exam, will obtain DVT studies of the bilateral lower extremities.  Patient began to have hypoxia into the mid to upper 80s on room air and continued to be hypoxic on 2 L of oxygen via nasal cannula.  She was placed on 3 L and had improvement in SPO2 to the 90s with periods in the upper 90s.  Given patient's leg pain, cough, and crackles with normal chest x-ray, will obtain a CTA with contrast to evaluate for lung or vascular abnormalities.  Tried hospitalist were consulted for admission of this patient.  Final Clinical Impressions(s) / ED Diagnoses   Final diagnoses:  Disorientation    ED Discharge  Orders    None       Kathrene Alu, MD 12/13/17 1537    Elnora Morrison, MD 12/13/17 1719

## 2017-12-13 NOTE — Progress Notes (Addendum)
ANTICOAGULATION CONSULT NOTE - Initial Consult  Pharmacy Consult for heparin Indication: DVT/small acute bilateral PE  Allergies  Allergen Reactions  . Dust Mite Extract Shortness Of Breath and Other (See Comments)    "sneezing" (02/20/2012)  . Molds & Smuts Shortness Of Breath  . Morphine Sulfate Itching  . Other Shortness Of Breath and Other (See Comments)    Grass and weeds "sneezing; filled sinuses" (02/20/2012)  . Penicillins Rash and Other (See Comments)    "welts" (02/20/2012) Has patient had a PCN reaction causing immediate rash, facial/tongue/throat swelling, SOB or lightheadedness with hypotension: Unknown Has patient had a PCN reaction causing severe rash involving mucus membranes or skin necrosis: No Has patient had a PCN reaction that required hospitalization: No Has patient had a PCN reaction occurring within the last 10 years: No If all of the above answers are "NO", then may proceed with Cephalosporin use.   Marland Kitchen Reclast [Zoledronic Acid] Other (See Comments)    Fever, Put in hospital, dr said it was a reaction from a reaction   . Rofecoxib Swelling    Celebrex/vioxx REACTION: feet swelling  . Shrimp Flavor Anaphylaxis    ALL SHELLFISH  . Tetracycline Hcl Nausea And Vomiting  . Xolair [Omalizumab] Other (See Comments)    Caused Blood clot  . Dilaudid [Hydromorphone Hcl] Itching  . Hydrocodone-Acetaminophen Nausea And Vomiting  . Levofloxacin Other (See Comments)    REACTION: GI upset  . Oxycodone Hcl Nausea And Vomiting  . Paroxetine Nausea And Vomiting    Paxil   . Diltiazem Swelling  . Tree Extract Other (See Comments)    "tested and told I was allergic to it; never experienced a reaction to it" (02/20/2012)    Patient Measurements: Height: 5\' 2"  (157.5 cm) Weight: 135 lb (61.2 kg) IBW/kg (Calculated) : 50.1 Heparin Dosing Weight:  61.2 kg  Vital Signs: Temp: 100.4 F (38 C) (10/25 1025) Temp Source: Rectal (10/25 1025) BP: 164/71 (10/25 1701) Pulse  Rate: 99 (10/25 1701)  Labs: Recent Labs    12/13/17 1051 12/13/17 1058  HGB 14.5 15.6*  HCT 44.3 46.0  PLT 201  --   CREATININE 1.47* 1.40*  CKTOTAL 2,446*  --     Estimated Creatinine Clearance: 32.6 mL/min (A) (by C-G formula based on SCr of 1.4 mg/dL (H)).   Medical History: Past Medical History:  Diagnosis Date  . Allergic rhinitis   . Allergy    SEASONAL  . Anemia   . Anxiety    pt denies  . Arthritis    "mostly the hands" (02/20/2012)  . Asthma   . Breast cancer ([place]) 1998   in remission  . Cataract    REMOVED  . Chronic lower back pain   . Chronic neck pain   . Complication of anesthesia    "had hard time waking up from it several times" (02/20/2012)  . Depression    "some; don't take anything for it" (02/20/2012)  . Diverticulosis   . DVT (deep venous thrombosis) ([place])   . Exertional dyspnea   . Fibromyalgia 11/2011  . GERD (gastroesophageal reflux disease)   . Graves disease   . Headache(784.0)    "related to allergies; more at different times during the year" (02/20/2012)  . Hemorrhoids   . Hiatal hernia    back and neck  . Hx of adenomatous colonic polyps 04/12/2016  . Hypercholesteremia    good cholesterol is high  . Hypothyroidism   . IBS (irritable bowel syndrome)   .  Moderate persistent asthma    -FeV1 72% 2011, -IgE 102 2011, CT sinus Neg 2011  . Osteoporosis    on reclast yearly  . Pneumonia 04/2011; ~ 11/2011   "double; single" (02/20/2012)    Medications:  Scheduled:  . denosumab  60 mg Subcutaneous Q6 months  . heparin  4,300 Units Intravenous Once  . [START ON 12/14/2017] lipase/protease/amylase  24,000 Units Oral TID AC  . Mepolizumab  100 mg Subcutaneous Q28 days  . Mepolizumab  100 mg Subcutaneous Q28 days  . Mepolizumab  100 mg Subcutaneous Q28 days  . pantoprazole  40 mg Oral Daily   Infusions:  . [START ON 12/14/2017] cefTRIAXone (ROCEPHIN)  IV    . heparin    . lactated ringers    . metronidazole      Assessment: Pt is  a 46 YOF presenting with altered mental status after falling at home. Pt does have history of DVT; no anticoagulation PTA. CTA reveals small acute pulmonary emboli in both lungs. Head CT is non-significant. Bilateral lower extremities duplex showed RLE DVT. Pharmacy has been consulted for heparin for DVT. Hgb - 15.6, Plt - 201  Goal of Therapy:  Heparin level 0.3-0.7 units/ml Monitor platelets by anticoagulation protocol: Yes   Plan:  Heparin 4300 unit bolus x1 then heparin gtt 1100 unit/hr 6-hour HL Daily HL and CBC  Mataya Kilduff J Dani Danis 12/13/2017,5:39 PM

## 2017-12-13 NOTE — Telephone Encounter (Signed)
Received PA determination.   PA denied.   Patient must try and fail (3) formulary alternatives, or provider must provide specific medical reasons why (3) of the covered drugs are not appropriate. Formulary alternatives include: Flurbiprofen Ketoprofen Lodine Nalfon Naprelan  MD please advise.

## 2017-12-13 NOTE — ED Triage Notes (Signed)
Per [facility] pt coming from home. Daughter found patient on floor right beside bed. Patient covered in emesis and feces. Unsure of how patient fell or when. Patient was last seen Tuesday by family. Pt had redness to right shoulder. C/o feeling cold and generalized pain.

## 2017-12-13 NOTE — Telephone Encounter (Signed)
Patient is currently in the hospital with what appears to be sepsis and rhabdomyolysis from unknown origin.  Please cancel any medication orders at the present time

## 2017-12-13 NOTE — Telephone Encounter (Signed)
Received request from pharmacy for PA on Flexeril.   PA submitted.   Dx: Muscle Spasms

## 2017-12-13 NOTE — Telephone Encounter (Signed)
[facility] is reviewing your PA request. Typically an electronic response will be received within 72 hours. To check for an update later, open this request from your dashboard.    You may close this dialog and return to your dashboard to perform other tasks.

## 2017-12-13 NOTE — Progress Notes (Addendum)
Bilateral lower extremity venous duplex completed. Right - Positive for an acut e DVT of the peroneal vein in the mid calf. Left - There is no evidence of  A DVT. Bilateral - There is no evidence of a Baker's cyst. Toma Copier, RVS 12/13/2017, 3:49 PM

## 2017-12-14 ENCOUNTER — Encounter (HOSPITAL_COMMUNITY): Payer: Self-pay

## 2017-12-14 LAB — HEPARIN LEVEL (UNFRACTIONATED)
Heparin Unfractionated: 0.94 IU/mL — ABNORMAL HIGH (ref 0.30–0.70)
Heparin Unfractionated: 0.61 IU/mL (ref 0.30–0.70)
Heparin Unfractionated: 0.57 IU/mL (ref 0.30–0.70)

## 2017-12-14 LAB — BASIC METABOLIC PANEL
Anion gap: 15 (ref 5–15)
BUN: 24 mg/dL — ABNORMAL HIGH (ref 8–23)
CO2: 21 mmol/L — ABNORMAL LOW (ref 22–32)
Calcium: 8 mg/dL — ABNORMAL LOW (ref 8.9–10.3)
Chloride: 105 mmol/L (ref 98–111)
Creatinine, Ser: 1.19 mg/dL — ABNORMAL HIGH (ref 0.44–1.00)
GFR calc Af Amer: 53 mL/min — ABNORMAL LOW (ref >60)
GFR calc non Af Amer: 45 mL/min — ABNORMAL LOW (ref >60)
Glucose, Bld: 103 mg/dL — ABNORMAL HIGH (ref 70–99)
Potassium: 3.7 mmol/L (ref 3.5–5.1)
Sodium: 141 mmol/L (ref 135–145)

## 2017-12-14 LAB — CBC WITH DIFFERENTIAL/PLATELET
Basophils Absolute: 0 10*3/uL (ref 0.0–0.1)
Basophils Relative: 0 %
Eosinophils Absolute: 0 10*3/uL (ref 0.0–0.5)
Eosinophils Relative: 0 %
HCT: 39.3 % (ref 36.0–46.0)
Hemoglobin: 12.5 g/dL (ref 12.0–15.0)
Lymphocytes Relative: 4 %
Lymphs Abs: 0.5 10*3/uL — ABNORMAL LOW (ref 0.7–4.0)
MCH: 31.8 pg (ref 26.0–34.0)
MCHC: 31.8 g/dL (ref 30.0–36.0)
MCV: 100 fL (ref 80.0–100.0)
Monocytes Absolute: 1.7 10*3/uL — ABNORMAL HIGH (ref 0.1–1.0)
Monocytes Relative: 14 %
Neutro Abs: 10.1 10*3/uL — ABNORMAL HIGH (ref 1.7–7.7)
Neutrophils Relative %: 82 %
Platelets: 203 10*3/uL (ref 150–400)
RBC: 3.93 MIL/uL (ref 3.87–5.11)
RDW: 13.1 % (ref 11.5–15.5)
WBC: 12.8 10*3/uL — ABNORMAL HIGH (ref 4.0–10.5)
nRBC: 0 % (ref 0.0–0.2)
nRBC: 0 /100 WBC

## 2017-12-14 LAB — URINE CULTURE: Culture: NO GROWTH

## 2017-12-14 LAB — MRSA PCR SCREENING: MRSA by PCR: NEGATIVE

## 2017-12-14 LAB — CK: Total CK: 1465 U/L — ABNORMAL HIGH (ref 38–234)

## 2017-12-14 LAB — HIV ANTIBODY (ROUTINE TESTING W REFLEX): HIV Screen 4th Generation wRfx: NONREACTIVE

## 2017-12-14 MED ORDER — CYCLOBENZAPRINE HCL 5 MG PO TABS
5.0000 mg | ORAL_TABLET | Freq: Three times a day (TID) | ORAL | Status: DC | PRN
  Administered 2017-12-15: 5 mg via ORAL
  Filled 2017-12-14: qty 1

## 2017-12-14 MED ORDER — GABAPENTIN 300 MG PO CAPS
300.0000 mg | ORAL_CAPSULE | Freq: Two times a day (BID) | ORAL | Status: DC
  Administered 2017-12-14 – 2017-12-15 (×2): 300 mg via ORAL
  Filled 2017-12-14 (×2): qty 1

## 2017-12-14 MED ORDER — METHIMAZOLE 5 MG PO TABS
5.0000 mg | ORAL_TABLET | Freq: Two times a day (BID) | ORAL | Status: DC
  Administered 2017-12-14 – 2017-12-15 (×2): 5 mg via ORAL
  Filled 2017-12-14 (×3): qty 1

## 2017-12-14 MED ORDER — MOMETASONE FURO-FORMOTEROL FUM 200-5 MCG/ACT IN AERO
1.0000 | INHALATION_SPRAY | Freq: Two times a day (BID) | RESPIRATORY_TRACT | Status: DC
  Administered 2017-12-15 – 2017-12-24 (×16): 1 via RESPIRATORY_TRACT
  Filled 2017-12-14: qty 8.8

## 2017-12-14 MED ORDER — TRAMADOL HCL 50 MG PO TABS
50.0000 mg | ORAL_TABLET | Freq: Four times a day (QID) | ORAL | Status: DC | PRN
  Administered 2017-12-15: 50 mg via ORAL
  Filled 2017-12-14: qty 1

## 2017-12-14 MED ORDER — SODIUM CHLORIDE 0.9 % IV SOLN
INTRAVENOUS | Status: DC
  Administered 2017-12-14 – 2017-12-16 (×5): via INTRAVENOUS

## 2017-12-14 MED ORDER — FENTANYL CITRATE (PF) 100 MCG/2ML IJ SOLN: 12.5000 ug | INTRAMUSCULAR | Status: DC | PRN

## 2017-12-14 MED ORDER — ACETAMINOPHEN 325 MG PO TABS
650.0000 mg | ORAL_TABLET | Freq: Four times a day (QID) | ORAL | Status: DC | PRN
  Administered 2017-12-15 – 2017-12-18 (×7): 650 mg via ORAL
  Filled 2017-12-14 (×7): qty 2

## 2017-12-14 MED ORDER — MIRTAZAPINE 30 MG PO TBDP
30.0000 mg | ORAL_TABLET | Freq: Every day | ORAL | Status: DC
  Administered 2017-12-14 – 2017-12-23 (×10): 30 mg via ORAL
  Filled 2017-12-14 (×12): qty 1

## 2017-12-14 MED ORDER — TIOTROPIUM BROMIDE MONOHYDRATE 2.5 MCG/ACT IN AERS: 2.0000 | INHALATION_SPRAY | Freq: Every day | RESPIRATORY_TRACT | Status: DC

## 2017-12-14 MED ORDER — LACTASE 3000 UNITS PO TABS
6000.0000 [IU] | ORAL_TABLET | Freq: Three times a day (TID) | ORAL | Status: DC
  Administered 2017-12-15 – 2017-12-24 (×24): 6000 [IU] via ORAL
  Filled 2017-12-14 (×32): qty 2

## 2017-12-14 MED ORDER — UMECLIDINIUM BROMIDE 62.5 MCG/INH IN AEPB
1.0000 | INHALATION_SPRAY | Freq: Every day | RESPIRATORY_TRACT | Status: DC
  Administered 2017-12-15 – 2017-12-24 (×9): 1 via RESPIRATORY_TRACT
  Filled 2017-12-14 (×2): qty 7

## 2017-12-14 MED ORDER — SIMETHICONE 80 MG PO CHEW
80.0000 mg | CHEWABLE_TABLET | Freq: Once | ORAL | Status: AC
  Administered 2017-12-14: 80 mg via ORAL
  Filled 2017-12-14: qty 1

## 2017-12-14 MED ORDER — MONTELUKAST SODIUM 10 MG PO TABS
10.0000 mg | ORAL_TABLET | Freq: Every day | ORAL | Status: DC
  Administered 2017-12-14 – 2017-12-24 (×11): 10 mg via ORAL
  Filled 2017-12-14 (×11): qty 1

## 2017-12-14 NOTE — Progress Notes (Signed)
RN paged Jeannette Corpus, NP to see if patient can have Simethicone for gas and Lactaid to help with dairy per patient's request, awaiting response.  P.J. Linus Mako, RN

## 2017-12-14 NOTE — Progress Notes (Addendum)
Pt arrived to unit by bed with daughter at bed side. Pt alert and oriented X 4. Pt restless with generalized pain of 7 on scale of 0-10. Pt have a laceration with bruise on the right shoulder and near the abdomen. Pt is resting after receiving pain med with daughter at bed side. Will continue to monitor.

## 2017-12-14 NOTE — Progress Notes (Signed)
[place] for Heparin Indication: DVT/small acute bilateral PE  Allergies  Allergen Reactions  . Dust Mite Extract Shortness Of Breath and Other (See Comments)    "sneezing" (02/20/2012)  . Molds & Smuts Shortness Of Breath  . Morphine Sulfate Itching  . Other Shortness Of Breath and Other (See Comments)    Grass and weeds "sneezing; filled sinuses" (02/20/2012)  . Penicillins Rash and Other (See Comments)    "welts" (02/20/2012) Has patient had a PCN reaction causing immediate rash, facial/tongue/throat swelling, SOB or lightheadedness with hypotension: Unknown Has patient had a PCN reaction causing severe rash involving mucus membranes or skin necrosis: No Has patient had a PCN reaction that required hospitalization: No Has patient had a PCN reaction occurring within the last 10 years: No If all of the above answers are "NO", then may proceed with Cephalosporin use.   Marland Kitchen Reclast [Zoledronic Acid] Other (See Comments)    Fever, Put in hospital, dr said it was a reaction from a reaction   . Rofecoxib Swelling    Vioxx REACTION: feet swelling  . Shrimp Flavor Anaphylaxis    ALL SHELLFISH  . Tetracycline Hcl Nausea And Vomiting  . Xolair [Omalizumab] Other (See Comments)    Caused Blood clot  . Dilaudid [Hydromorphone Hcl] Itching  . Hydrocodone-Acetaminophen Nausea And Vomiting  . Levofloxacin Other (See Comments)    REACTION: GI upset  . Oxycodone Hcl Nausea And Vomiting  . Paroxetine Nausea And Vomiting    Paxil   . Celecoxib Swelling    Feet swelling  . Diltiazem Swelling  . Tree Extract Other (See Comments)    "tested and told I was allergic to it; never experienced a reaction to it" (02/20/2012)    Patient Measurements: Height: 5\' 1"  (154.9 cm) Weight: 133 lb 2.5 oz (60.4 kg) IBW/kg (Calculated) : 47.8 Heparin Dosing Weight:  61.2 kg  Vital Signs: Temp: 98.6 F (37 C) (10/26 0207) Temp Source: Oral (10/26 0207) BP: 165/78  (10/26 0207) Pulse Rate: 93 (10/26 0207)  Labs: Recent Labs    12/13/17 1051 12/13/17 1058 12/14/17 0054  HGB 14.5 15.6*  --   HCT 44.3 46.0  --   PLT 201  --   --   HEPARINUNFRC  --   --  0.94*  CREATININE 1.47* 1.40*  --   CKTOTAL 2,446*  --   --     Estimated Creatinine Clearance: 31.6 mL/min (A) (by C-G formula based on SCr of 1.4 mg/dL (H)).   Medical History: Past Medical History:  Diagnosis Date  . Allergic rhinitis   . Allergy    SEASONAL  . Anemia   . Anxiety    pt denies  . Arthritis    "mostly the hands" (02/20/2012)  . Asthma   . Breast cancer ([place]) 1998   in remission  . Cataract    REMOVED  . Chronic lower back pain   . Chronic neck pain   . Complication of anesthesia    "had hard time waking up from it several times" (02/20/2012)  . Depression    "some; don't take anything for it" (02/20/2012)  . Diverticulosis   . DVT (deep venous thrombosis) ([place])   . Exertional dyspnea   . Fibromyalgia 11/2011  . GERD (gastroesophageal reflux disease)   . Graves disease   . Headache(784.0)    "related to allergies; more at different times during the year" (02/20/2012)  . Hemorrhoids   . Hiatal hernia  back and neck  . Hx of adenomatous colonic polyps 04/12/2016  . Hypercholesteremia    good cholesterol is high  . Hypothyroidism   . IBS (irritable bowel syndrome)   . Moderate persistent asthma    -FeV1 72% 2011, -IgE 102 2011, CT sinus Neg 2011  . Osteoporosis    on reclast yearly  . Pneumonia 04/2011; ~ 11/2011   "double; single" (02/20/2012)    Medications:  Scheduled:  . lipase/protease/amylase  24,000 Units Oral TID AC  . pantoprazole  40 mg Oral Daily   Infusions:  . cefTRIAXone (ROCEPHIN)  IV    . heparin 1,100 Units/hr (12/13/17 1811)  . lactated ringers 125 mL/hr at 12/13/17 1810  . metronidazole Stopped (12/14/17 0005)    Assessment: Pt is a 1 YOF presenting with altered mental status after falling at home. Pt does have history of DVT; no  anticoagulation PTA. CTA reveals small acute pulmonary emboli in both lungs. Head CT is non-significant. Bilateral lower extremities duplex showed RLE DVT. Pharmacy has been consulted for heparin for DVT. Hgb - 15.6, Plt - 201  10/26 AM update: initial heparin level high, no issues per RN.   Goal of Therapy:  Heparin level 0.3-0.7 units/ml Monitor platelets by anticoagulation protocol: Yes   Plan:  Dec heparin to 950 units/hr Check heparin level in 8 hours Daily HL and CBC  Ebony Scott 12/14/2017,2:11 AM

## 2017-12-14 NOTE — Progress Notes (Addendum)
[place] for Heparin Indication: DVT/small acute bilateral PE  Allergies  Allergen Reactions  . Dust Mite Extract Shortness Of Breath and Other (See Comments)    "sneezing" (02/20/2012)  . Molds & Smuts Shortness Of Breath  . Morphine Sulfate Itching  . Other Shortness Of Breath and Other (See Comments)    Grass and weeds "sneezing; filled sinuses" (02/20/2012)  . Penicillins Rash and Other (See Comments)    "welts" (02/20/2012) Has patient had a PCN reaction causing immediate rash, facial/tongue/throat swelling, SOB or lightheadedness with hypotension: Unknown Has patient had a PCN reaction causing severe rash involving mucus membranes or skin necrosis: No Has patient had a PCN reaction that required hospitalization: No Has patient had a PCN reaction occurring within the last 10 years: No If all of the above answers are "NO", then may proceed with Cephalosporin use.   Marland Kitchen Reclast [Zoledronic Acid] Other (See Comments)    Fever, Put in hospital, dr said it was a reaction from a reaction   . Rofecoxib Swelling    Vioxx REACTION: feet swelling  . Shrimp Flavor Anaphylaxis    ALL SHELLFISH  . Tetracycline Hcl Nausea And Vomiting  . Xolair [Omalizumab] Other (See Comments)    Caused Blood clot  . Dilaudid [Hydromorphone Hcl] Itching  . Hydrocodone-Acetaminophen Nausea And Vomiting  . Levofloxacin Other (See Comments)    REACTION: GI upset  . Oxycodone Hcl Nausea And Vomiting  . Paroxetine Nausea And Vomiting    Paxil   . Celecoxib Swelling    Feet swelling  . Diltiazem Swelling  . Tree Extract Other (See Comments)    "tested and told I was allergic to it; never experienced a reaction to it" (02/20/2012)    Patient Measurements: Height: 5\' 1"  (154.9 cm) Weight: 133 lb 2.5 oz (60.4 kg) IBW/kg (Calculated) : 47.8 Heparin Dosing Weight:  61.2 kg  Vital Signs: Temp: 98.8 F (37.1 C) (10/26 0800) Temp Source: Oral (10/26 0800) BP: 161/78  (10/26 0447) Pulse Rate: 87 (10/26 0447)  Labs: Recent Labs    12/13/17 1051 12/13/17 1058 12/14/17 0054 12/14/17 0434 12/14/17 1008  HGB 14.5 15.6*  --  12.5  --   HCT 44.3 46.0  --  39.3  --   PLT 201  --   --  203  --   HEPARINUNFRC  --   --  0.94*  --  0.61  CREATININE 1.47* 1.40*  --  1.19*  --   CKTOTAL 2,446*  --   --  1,465*  --     Estimated Creatinine Clearance: 37.2 mL/min (A) (by C-G formula based on SCr of 1.19 mg/dL (H)).   Medical History: Past Medical History:  Diagnosis Date  . Allergic rhinitis   . Allergy    SEASONAL  . Anemia   . Anxiety    pt denies  . Arthritis    "mostly the hands" (02/20/2012)  . Asthma   . Breast cancer ([place]) 1998   in remission  . Cataract    REMOVED  . Chronic lower back pain   . Chronic neck pain   . Complication of anesthesia    "had hard time waking up from it several times" (02/20/2012)  . Depression    "some; don't take anything for it" (02/20/2012)  . Diverticulosis   . DVT (deep venous thrombosis) ([place])   . Exertional dyspnea   . Fibromyalgia 11/2011  . GERD (gastroesophageal reflux disease)   . Graves disease   .  Headache(784.0)    "related to allergies; more at different times during the year" (02/20/2012)  . Hemorrhoids   . Hiatal hernia    back and neck  . Hx of adenomatous colonic polyps 04/12/2016  . Hypercholesteremia    good cholesterol is high  . Hypothyroidism   . IBS (irritable bowel syndrome)   . Moderate persistent asthma    -FeV1 72% 2011, -IgE 102 2011, CT sinus Neg 2011  . Osteoporosis    on reclast yearly  . Pneumonia 04/2011; ~ 11/2011   "double; single" (02/20/2012)    Medications:  Scheduled:  . lipase/protease/amylase  24,000 Units Oral TID AC  . pantoprazole  40 mg Oral Daily   Infusions:  . cefTRIAXone (ROCEPHIN)  IV    . heparin 950 Units/hr (12/14/17 0205)  . lactated ringers 125 mL/hr at 12/14/17 0448  . metronidazole 500 mg (12/14/17 0610)    Assessment: Pt is a 36 YOF  presenting with altered mental status after falling at home. Pt does have history of DVT; no anticoagulation PTA. CTA reveals small acute pulmonary emboli in both lungs. Head CT is non-significant. Bilateral lower extremities duplex showed RLE DVT.  Rate adjusted HL came back therapeutic this AM. We will repeat a level to confirm.   PM: HL came back therapeutic again this PM.  Cont same rate.   Goal of Therapy:  Heparin level 0.3-0.7 units/ml Monitor platelets by anticoagulation protocol: Yes   Plan:  Cont heparin to 950 units/hr Confirm heparin level in 8 hours Daily HL and CBC  Onnie Boer, PharmD, BCIDP, AAHIVP, CPP Infectious Disease Pharmacist Pager: [phone] 12/14/2017 11:07 AM

## 2017-12-14 NOTE — Progress Notes (Signed)
Day TEAM 1 - Stepdown/ICU TEAM  Ebony Scott  PZW:[id number] DOB: [birth year] DOA: 12/13/2017 PCP: [name], MD    Brief Narrative:  67 y.o. female with a hx of anxiety, depression, asthma, breast cancer, chronic back pain, chronic neck pain, diverticulosis, DVT, fibromyalgia, GERD, Graves' disease, hyperlipidemia, IBS, and osteoporosis who was found down by her daughter confused and soiled w/ vomit and feces. She does not remember what happened or having any symptoms prior to losing consciousness.  She was last known to be well on Wednesday.  Today she would not answer the door for her daughter, who eventually was able to get in the house and called EMS.  In the ED her vitals were unremarkable and a CXR did not show any significant abnormality.  CT head did not show any acute intracranial pathology.  Bilateral lower extremities duplex showed RLE DVT.  Significant Events: 10/25 admit after found down at home   Subjective: C/o diffuse generalized aching, as well as pain at site of abrasion to R posterior shoulder. Denies signif cp, n/v, or abdom pain aat this time. Is mildly SOB. Is hungry.   Assessment & Plan:  Acute B PE - Acute R LE DVT Cont heparin - transition to oral anticoagulant once clear pt has stabilized fully  SIRS - NOT SEPSIS No evidence of active infection - stop abx - SIRS due to PE and rhabdo    Low grade Rhabdomyolysis Cont to hydrate - watch renal function and UOP  Chronic diastolic CHF  No evidence of signif volume overload at this time - follow wgts and Is/Os as pt presently requires volume expansion   Hyperlipidemia Hold statin due to rhabdomyolysis.  GERD Cont home medical tx  Pseudogout Cont home medical tx   Hyperthyroidism Cont usual dose of methimazole   DVT prophylaxis: IV heparin  Code Status: FULL CODE Family Communication: spoke w/ daughter (an Therapist, sports at [employer]) at bedside  Disposition Plan: stable for transition to tele bed - begin  PT/OT 10/27  Consultants:  none  Antimicrobials:  Rocephin 10/25 > 10/26  Flagyl 10/25 > 10/26   Objective: Blood pressure (!) 161/78, pulse 89, temperature 98.3 F (36.8 C), temperature source Oral, resp. rate (!) 24, height 5\' 1"  (1.549 m), weight 60.4 kg, SpO2 96 %.  Intake/Output Summary (Last 24 hours) at 12/14/2017 1631 Last data filed at 12/14/2017 1300 Gross per 24 hour  Intake 1576.03 ml  Output -  Net 1576.03 ml   Filed Weights   12/13/17 1026 12/14/17 0207  Weight: 61.2 kg 60.4 kg    Examination: General: No acute respiratory distress Lungs: Clear to auscultation bilaterally without wheezes or crackles Cardiovascular: Regular rate and rhythm without murmur gallop or rub normal S1 and S2 Abdomen: Nontender, nondistended, soft, bowel sounds positive, no rebound, no ascites, no appreciable mass Extremities: No significant cyanosis, clubbing, or edema bilateral lower extremities  CBC: Recent Labs  Lab 12/13/17 1051 12/13/17 1058 12/14/17 0434  WBC 13.4*  --  12.8*  NEUTROABS 10.2*  --  10.1*  HGB 14.5 15.6* 12.5  HCT 44.3 46.0 39.3  MCV 99.3  --  100.0  PLT 201  --  423   Basic Metabolic Panel: Recent Labs  Lab 12/13/17 1051 12/13/17 1058 12/13/17 1849 12/14/17 0434  NA 139 138  --  141  K 3.7 4.0  --  3.7  CL 101 103  --  105  CO2 22  --   --  21*  GLUCOSE  126* 126*  --  103*  BUN 29* 44*  --  24*  CREATININE 1.47* 1.40*  --  1.19*  CALCIUM 8.5*  --   --  8.0*  MG  --   --  2.3  --   PHOS  --   --  2.1*  --    GFR: Estimated Creatinine Clearance: 37.2 mL/min (A) (by C-G formula based on SCr of 1.19 mg/dL (H)).  Liver Function Tests: Recent Labs  Lab 12/13/17 1051  AST 87*  ALT 55*  ALKPHOS 72  BILITOT 2.3*  PROT 6.7  ALBUMIN 2.8*    Cardiac Enzymes: Recent Labs  Lab 12/13/17 1051 12/14/17 0434  CKTOTAL 2,446* 1,465*    Recent Results (from the past 240 hour(s))  Blood Culture (routine x 2)     Status: None (Preliminary  result)   Collection Time: 12/13/17 11:38 AM  Result Value Ref Range Status   Specimen Description BLOOD RIGHT ANTECUBITAL  Final   Special Requests   Final    BOTTLES DRAWN AEROBIC AND ANAEROBIC Blood Culture results may not be optimal due to an excessive volume of blood received in culture bottles   Culture   Final    NO GROWTH 1 DAY Performed at [facility] [address], [place], [place] [address]    Report Status PENDING  Incomplete  Blood Culture (routine x 2)     Status: None (Preliminary result)   Collection Time: 12/13/17 11:53 AM  Result Value Ref Range Status   Specimen Description BLOOD BLOOD RIGHT HAND  Final   Special Requests   Final    BOTTLES DRAWN AEROBIC AND ANAEROBIC Blood Culture adequate volume   Culture   Final    NO GROWTH 1 DAY Performed at [facility], [place] [address] [place], [place] [address]    Report Status PENDING  Incomplete  Urine culture     Status: None   Collection Time: 12/13/17  2:41 PM  Result Value Ref Range Status   Specimen Description URINE, RANDOM  Final   Special Requests NONE  Final   Culture   Final    NO GROWTH Performed at [facility], [address] [place], [place] [address]    Report Status 12/14/2017 FINAL  Final  MRSA PCR Screening     Status: None   Collection Time: 12/14/17  2:45 AM  Result Value Ref Range Status   MRSA by PCR NEGATIVE NEGATIVE Final    Comment:        The GeneXpert MRSA Assay (FDA approved for NASAL specimens only), is one component of a comprehensive MRSA colonization surveillance program. It is not intended to diagnose MRSA infection nor to guide or monitor treatment for MRSA infections. Performed at [facility], [place] [address], [place], [place] [address]      Scheduled Meds: . lipase/protease/amylase  24,000 Units Oral TID AC  . pantoprazole  40 mg Oral Daily     LOS: 1 day   Cherene Altes, MD [facility] Office  [phone] Pager - Text  Page per Shea Evans  If 7PM-7AM, please contact night-coverage per Amion 12/14/2017, 4:31 PM

## 2017-12-15 ENCOUNTER — Inpatient Hospital Stay (HOSPITAL_COMMUNITY): Payer: Medicare Other

## 2017-12-15 DIAGNOSIS — I5032 Chronic diastolic (congestive) heart failure: Secondary | ICD-10-CM

## 2017-12-15 DIAGNOSIS — E0590 Thyrotoxicosis, unspecified without thyrotoxic crisis or storm: Secondary | ICD-10-CM

## 2017-12-15 DIAGNOSIS — K219 Gastro-esophageal reflux disease without esophagitis: Secondary | ICD-10-CM

## 2017-12-15 LAB — COMPREHENSIVE METABOLIC PANEL
ALT: 61 U/L — ABNORMAL HIGH (ref 0–44)
AST: 85 U/L — ABNORMAL HIGH (ref 15–41)
Albumin: 2 g/dL — ABNORMAL LOW (ref 3.5–5.0)
Alkaline Phosphatase: 58 U/L (ref 38–126)
Anion gap: 8 (ref 5–15)
BUN: 20 mg/dL (ref 8–23)
CO2: 21 mmol/L — ABNORMAL LOW (ref 22–32)
Calcium: 7.1 mg/dL — ABNORMAL LOW (ref 8.9–10.3)
Chloride: 109 mmol/L (ref 98–111)
Creatinine, Ser: 1.07 mg/dL — ABNORMAL HIGH (ref 0.44–1.00)
GFR calc Af Amer: 60 mL/min — ABNORMAL LOW (ref >60)
GFR calc non Af Amer: 52 mL/min — ABNORMAL LOW (ref >60)
Glucose, Bld: 98 mg/dL (ref 70–99)
Potassium: 3.3 mmol/L — ABNORMAL LOW (ref 3.5–5.1)
Sodium: 138 mmol/L (ref 135–145)
Total Bilirubin: 0.9 mg/dL (ref 0.3–1.2)
Total Protein: 5.2 g/dL — ABNORMAL LOW (ref 6.5–8.1)

## 2017-12-15 LAB — CBC
HCT: 32.3 % — ABNORMAL LOW (ref 36.0–46.0)
Hemoglobin: 10.7 g/dL — ABNORMAL LOW (ref 12.0–15.0)
MCH: 32.5 pg (ref 26.0–34.0)
MCHC: 33.1 g/dL (ref 30.0–36.0)
MCV: 98.2 fL (ref 80.0–100.0)
Platelets: 201 10*3/uL (ref 150–400)
RBC: 3.29 MIL/uL — ABNORMAL LOW (ref 3.87–5.11)
RDW: 13.4 % (ref 11.5–15.5)
WBC: 10.4 10*3/uL (ref 4.0–10.5)
nRBC: 0 % (ref 0.0–0.2)

## 2017-12-15 LAB — HEPARIN LEVEL (UNFRACTIONATED): Heparin Unfractionated: 0.47 IU/mL (ref 0.30–0.70)

## 2017-12-15 LAB — CK: Total CK: 559 U/L — ABNORMAL HIGH (ref 38–234)

## 2017-12-15 MED ORDER — POTASSIUM CHLORIDE CRYS ER 20 MEQ PO TBCR
40.0000 meq | EXTENDED_RELEASE_TABLET | Freq: Two times a day (BID) | ORAL | Status: AC
  Administered 2017-12-15 (×2): 40 meq via ORAL
  Filled 2017-12-15 (×2): qty 2

## 2017-12-15 MED ORDER — RIVAROXABAN 20 MG PO TABS: 20.0000 mg | ORAL_TABLET | Freq: Every day | ORAL | Status: DC

## 2017-12-15 MED ORDER — TRAMADOL HCL 50 MG PO TABS
100.0000 mg | ORAL_TABLET | Freq: Four times a day (QID) | ORAL | Status: DC | PRN
  Administered 2017-12-15 – 2017-12-16 (×3): 100 mg via ORAL
  Filled 2017-12-15 (×3): qty 2

## 2017-12-15 MED ORDER — GABAPENTIN 600 MG PO TABS
600.0000 mg | ORAL_TABLET | Freq: Every day | ORAL | Status: DC
  Administered 2017-12-15 – 2017-12-23 (×9): 600 mg via ORAL
  Filled 2017-12-15 (×9): qty 1

## 2017-12-15 MED ORDER — RIVAROXABAN 15 MG PO TABS
15.0000 mg | ORAL_TABLET | Freq: Two times a day (BID) | ORAL | Status: DC
  Administered 2017-12-15 – 2017-12-24 (×19): 15 mg via ORAL
  Filled 2017-12-15 (×19): qty 1

## 2017-12-15 NOTE — Progress Notes (Signed)
PROGRESS NOTE    Ebony Scott  FAO:[id number] DOB: [birth year] DOA: 12/13/2017 PCP: [name], MD    Brief Narrative:  67 y.o.femalewith a hx of anxiety, depression, asthma, breast cancer, chronic back pain, chronic neck pain, diverticulosis, DVT, fibromyalgia, GERD, Graves' disease, hyperlipidemia, IBS, and osteoporosis who was found down by her daughter confused and soiled w/ vomit and feces. She does not remember what happened or having any symptoms prior to losing consciousness. In the ED her vitals were unremarkable and a CXR did not show any significant abnormality. CT head did not show any acute intracranial pathology. Bilateral lower extremities duplex showed RLE DVT.  CT angiogram of the chest shows bilateral small pulmonary embolism.    Assessment & Plan:   Principal Problem: Acute bilateral PE Active Problems:   Hyperthyroidism   Hyperlipidemia   GERD   Pseudogout   Chronic diastolic CHF (congestive heart failure) (HCC)   Rhabdomyolysis   Acute deep vein thrombosis (DVT) of right lower extremity (HCC)   Acute bilateral PE and acute right lower extremity DVT She was started on IV heparin and transition to oral Xarelto today.   Sirs, sepsis ruled out No evidence of infection and antibiotics were stopped. There is probably from rhabdomyolysis and acute pulmonary embolism.   Acute rhabdomyolysis Renal function and urine output continues to improve with IV fluids recommend checking another CK level in the morning.  Pain control for lower extremity pain.    Chronic diastolic heart failure she appears euvolemic continue to monitor.   Hyper lipidemia holding statin due to acute rhabdomyolysis at this time.   History of hyper parathyroidism TSH is 5 patient's PCP recommended holding methimazole for 1 month from October 17.   GERD Continue with Protonix.      DVT prophylaxis: Heparin transition to Xarelto Code Status: Full code Family  Communication: Gust with daughter at bedside  disposition Plan: Pending clinical improvement  Consultants:   None   Procedures: None   Antimicrobials: None   Subjective: She reports persistent leg pain requesting extra strength Tylenol.  Discussed plan of care with the patient and her daughter at bedside  Objective: Vitals:   12/14/17 2132 12/15/17 0544 12/15/17 0819 12/15/17 1408  BP: 130/83 (!) 144/76  (!) 149/67  Pulse: 89 90  90  Resp:    20  Temp: 99.3 F (37.4 C) 99.1 F (37.3 C)  99.4 F (37.4 C)  TempSrc:    Oral  SpO2: 94% 94% 93% 93%  Weight:      Height:        Intake/Output Summary (Last 24 hours) at 12/15/2017 1645 Last data filed at 12/15/2017 1136 Gross per 24 hour  Intake 2449.98 ml  Output 600 ml  Net 1849.98 ml   Filed Weights   12/13/17 1026 12/14/17 0207  Weight: 61.2 kg 60.4 kg    Examination:  General exam: In mild to moderate distress from pain in the lower extremities Respiratory system: Clear to auscultation. Respiratory effort normal.  No wheezing or rhonchi Cardiovascular system: S1 & S2 heard, RRR. No JV No pedal edema. Gastrointestinal system: Abdomen is nondistended, soft and nontender. No organomegaly or masses felt. Normal bowel sounds heard. Central nervous system: Alert and oriented.  No focal deficits Extremities: Symmetric 5 x 5 power.  Bruised left knee, right calf swelling Skin: No rashes, lesions or ulcers Psychiatry: Anxious    Data Reviewed: I have personally reviewed following labs and imaging studies  CBC: Recent Labs  Lab  12/13/17 1051 12/13/17 1058 12/14/17 0434 12/15/17 0342  WBC 13.4*  --  12.8* 10.4  NEUTROABS 10.2*  --  10.1*  --   HGB 14.5 15.6* 12.5 10.7*  HCT 44.3 46.0 39.3 32.3*  MCV 99.3  --  100.0 98.2  PLT 201  --  203 916   Basic Metabolic Panel: Recent Labs  Lab 12/13/17 1051 12/13/17 1058 12/13/17 1849 12/14/17 0434 12/15/17 0342  NA 139 138  --  141 138  K 3.7 4.0  --  3.7  3.3*  CL 101 103  --  105 109  CO2 22  --   --  21* 21*  GLUCOSE 126* 126*  --  103* 98  BUN 29* 44*  --  24* 20  CREATININE 1.47* 1.40*  --  1.19* 1.07*  CALCIUM 8.5*  --   --  8.0* 7.1*  MG  --   --  2.3  --   --   PHOS  --   --  2.1*  --   --    GFR: Estimated Creatinine Clearance: 41.4 mL/min (A) (by C-G formula based on SCr of 1.07 mg/dL (H)). Liver Function Tests: Recent Labs  Lab 12/13/17 1051 12/15/17 0342  AST 87* 85*  ALT 55* 61*  ALKPHOS 72 58  BILITOT 2.3* 0.9  PROT 6.7 5.2*  ALBUMIN 2.8* 2.0*   No results for input(s): LIPASE, AMYLASE in the last 168 hours. No results for input(s): AMMONIA in the last 168 hours. Coagulation Profile: No results for input(s): INR, PROTIME in the last 168 hours. Cardiac Enzymes: Recent Labs  Lab 12/13/17 1051 12/14/17 0434 12/15/17 0342  CKTOTAL 2,446* 1,465* 559*   BNP (last 3 results) No results for input(s): PROBNP in the last 8760 hours. HbA1C: No results for input(s): HGBA1C in the last 72 hours. CBG: No results for input(s): GLUCAP in the last 168 hours. Lipid Profile: No results for input(s): CHOL, HDL, LDLCALC, TRIG, CHOLHDL, LDLDIRECT in the last 72 hours. Thyroid Function Tests: Recent Labs    12/13/17 1154  TSH 5.019*   Anemia Panel: No results for input(s): VITAMINB12, FOLATE, FERRITIN, TIBC, IRON, RETICCTPCT in the last 72 hours. Sepsis Labs: Recent Labs  Lab 12/13/17 1059 12/13/17 1629  LATICACIDVEN 2.28* 1.52    Recent Results (from the past 240 hour(s))  Blood Culture (routine x 2)     Status: None (Preliminary result)   Collection Time: 12/13/17 11:38 AM  Result Value Ref Range Status   Specimen Description BLOOD RIGHT ANTECUBITAL  Final   Special Requests   Final    BOTTLES DRAWN AEROBIC AND ANAEROBIC Blood Culture results may not be optimal due to an excessive volume of blood received in culture bottles   Culture   Final    NO GROWTH 2 DAYS Performed at [facility]  [address], [place], [place] [address]    Report Status PENDING  Incomplete  Blood Culture (routine x 2)     Status: None (Preliminary result)   Collection Time: 12/13/17 11:53 AM  Result Value Ref Range Status   Specimen Description BLOOD BLOOD RIGHT HAND  Final   Special Requests   Final    BOTTLES DRAWN AEROBIC AND ANAEROBIC Blood Culture adequate volume   Culture   Final    NO GROWTH 2 DAYS Performed at [facility], [place] [address] [place], [place] [address]    Report Status PENDING  Incomplete  Urine culture     Status:  None   Collection Time: 12/13/17  2:41 PM  Result Value Ref Range Status   Specimen Description URINE, RANDOM  Final   Special Requests NONE  Final   Culture   Final    NO GROWTH Performed at [facility], [address], [place], [place] [address]    Report Status 12/14/2017 FINAL  Final  MRSA PCR Screening     Status: None   Collection Time: 12/14/17  2:45 AM  Result Value Ref Range Status   MRSA by PCR NEGATIVE NEGATIVE Final    Comment:        The GeneXpert MRSA Assay (FDA approved for NASAL specimens only), is one component of a comprehensive MRSA colonization surveillance program. It is not intended to diagnose MRSA infection nor to guide or monitor treatment for MRSA infections. Performed at [facility], [place] [address], [place], [place] [address]          Radiology Studies: Ct Angio Chest Pe W And/or Wo Contrast  Addendum Date: 12/13/2017   ADDENDUM REPORT: 12/13/2017 18:40 ADDENDUM: Critical Value/emergent results were called by telephone at the time of interpretation on 12/13/2017 at 1804 hours to Dr. Tennis Must , who verbally acknowledged these results. Electronically Signed   By: Genevie Ann M.D.   On: 12/13/2017 18:40   Result Date: 12/13/2017 CLINICAL DATA:  67 year old female with right lower extremity DVT in the calf. Cough. Found down. EXAM: CT ANGIOGRAPHY CHEST WITH CONTRAST TECHNIQUE: Multidetector CT imaging of the  chest was performed using the standard protocol during bolus administration of intravenous contrast. Multiplanar CT image reconstructions and MIPs were obtained to evaluate the vascular anatomy. CONTRAST:  122mL ISOVUE-370 IOPAMIDOL (ISOVUE-370) INJECTION 76% COMPARISON:  High-resolution chest CT 08/05/2017 and earlier. FINDINGS: Cardiovascular: Adequate contrast bolus timing in the pulmonary arterial tree. Lower lobe respiratory motion. There is a small pulmonary embolus in a branch to the posterior left upper lobe seen on series 6, image 97. No other left lung PE is evident. No central or saddle embolus. There is positive thrombus in the right middle lobe pulmonary artery on series 6, image 122. No other right lung PE identified. Mild cardiomegaly. No pericardial effusion. Calcified aortic atherosclerosis. Calcified coronary artery atherosclerosis. Mediastinum/Nodes: Negative, no lymphadenopathy. Lungs/Pleura: Low lung volumes with atelectasis. No pulmonary infarct or consolidation. Trace if any pleural fluid. The major airways are patent. Upper Abdomen: Surgically absent gallbladder. Negative visible liver, spleen, right adrenal gland. Mild fluid and gas distended visible proximal stomach. Musculoskeletal: Partially visible postoperative changes to the C7 spinous process. Moderate to severe cervicothoracic junction facet degeneration is partially visible. No acute osseous abnormality identified. Review of the MIP images confirms the above findings. IMPRESSION: 1. Positive for small volume Acute Pulmonary Emboli in both lungs. 2. No pulmonary infarct identified. Trace if any pleural fluid. 3. Mild cardiomegaly. Calcified coronary artery and Aortic Atherosclerosis (ICD10-I70.0). Electronically Signed: By: Genevie Ann M.D. On: 12/13/2017 17:58   Dg Knee 3 Views Left  Result Date: 12/15/2017 CLINICAL DATA:  Patient reports knee pain . Says she fell on Friday. Says she twisted when she fell. EXAM: LEFT KNEE - 3 VIEW  COMPARISON:  None. FINDINGS: No fracture.  No bone lesion. Knee joint normally spaced and aligned. No significant arthropathic change. Chondrocalcinosis extends along the menisci. Minimal joint effusion. Soft tissues are unremarkable. IMPRESSION: 1. No fracture. 2. Minimal joint effusion. Electronically Signed   By: Lajean Manes M.D.   On: 12/15/2017 15:36   US  Abdomen Limited Ruq  Result Date: 12/13/2017 CLINICAL DATA:  67 year old female with hyperbilirubinemia. Cholecystectomy years ago. EXAM: ULTRASOUND ABDOMEN LIMITED RIGHT UPPER QUADRANT COMPARISON:  CTA chest today. FINDINGS: Gallbladder: Surgically absent Common bile duct: Diameter: 2-3 millimeters, normal. Liver: No focal lesion identified. Within normal limits in parenchymal echogenicity. No intrahepatic biliary ductal dilatation is evident on these images or the CTA chest today. Portal vein is patent on color Doppler imaging with normal direction of blood flow towards the liver. Other findings: Negative visible right kidney. IMPRESSION: Surgically absent gallbladder with no evidence of bile duct obstruction. Electronically Signed   By: Genevie Ann M.D.   On: 12/13/2017 18:48        Scheduled Meds: . gabapentin  300 mg Oral BID  . lactase  6,000 Units Oral TID WC  . methimazole  5 mg Oral BID  . mirtazapine  30 mg Oral QHS  . mometasone-formoterol  1 puff Inhalation BID  . montelukast  10 mg Oral Daily  . pantoprazole  40 mg Oral Daily  . potassium chloride  40 mEq Oral BID  . umeclidinium bromide  1 puff Inhalation Daily   Continuous Infusions: . sodium chloride 100 mL/hr at 12/15/17 0858  . heparin 950 Units/hr (12/14/17 1422)     LOS: 2 days    Time spent: 35 minutes.     Hosie Poisson, MD [facility] Pager [phone]   If 7PM-7AM, please contact night-coverage [email or web address] Password [email or web address] 12/15/2017, 4:45 PM

## 2017-12-15 NOTE — Plan of Care (Signed)
Patient sitting up in bed. Complaints of right leg hurting. Per Dr. Karleen Hampshire give Tylenol, order place for x-ray of right knee and Toradol has been increased to 100mg . Will continue to follow as needed.

## 2017-12-15 NOTE — Evaluation (Signed)
Occupational Therapy Evaluation Patient Details Name: Ebony Scott MRN: [id number] DOB: 07/25/1950 Today's Date: 12/15/2017    History of Present Illness PT is a 67 y.o. female with a hx of anxiety, depression, asthma, breast cancer, chronic back pain, chronic neck pain, diverticulosis, DVT, fibromyalgia, GERD, Graves' disease, hyperlipidemia, IBS, and osteoporosis who was found down by her daughter confused and soiled w/ vomit and feces. Last know to be well time on Wed. CT negative for intracranial pathology.  Found with R LE DVT and small B PE, SIRS and low grade rhabdomyolysis per internal medicine.    Clinical Impression   PTA patient independent and driving.  Admitted for above and limited by below decreased activity tolerance, pain, generalized weakness, and impaired balance.  Pt currently requires setup assist for grooming, min assist for UB ADLs, max to total assist for LB ADLs, and -mod max +2 assist for bed mobility (deferred transfers today for safety).  Believe patient will best benefit from intensive CIR level therapies in order to maximize return to PLOF and believe she will be able to progress towards a modified independent level.  Will continue to follow while admitted.    Follow Up Recommendations  CIR    Equipment Recommendations  Other (comment)(TBD at next venue of care)    Recommendations for Other Services Rehab consult     Precautions / Restrictions Precautions Precautions: Fall Restrictions Weight Bearing Restrictions: No      Mobility Bed Mobility Overal bed mobility: Needs Assistance Bed Mobility: Rolling;Sidelying to Sit;Sit to Sidelying Rolling: Mod assist;+2 for physical assistance Sidelying to sit: Max assist;+2 for physical assistance     Sit to sidelying: Total assist General bed mobility comments: assist for bed mobility due to weakness and pain in B LEs, mod assist for LEs rolling, max assist for LB movement and trunk control to  EOB  Transfers                 General transfer comment: deferred for safety, anticipate +2 assist    Balance Overall balance assessment: Needs assistance Sitting-balance support: No upper extremity supported;Feet supported Sitting balance-Leahy Scale: Fair Sitting balance - Comments: initally reliant on B UE support, able to progress towards 0 hand support statically                                    ADL either performed or assessed with clinical judgement   ADL Overall ADL's : Needs assistance/impaired     Grooming: Set up;Sitting   Upper Body Bathing: Minimal assistance;Sitting   Lower Body Bathing: Moderate assistance;Bed level   Upper Body Dressing : Minimal assistance;Sitting   Lower Body Dressing: Total assistance;Bed level;+2 for physical assistance     Toilet Transfer Details (indicate cue type and reason): deferred Toileting- Clothing Manipulation and Hygiene: Total assistance;+2 for physical assistance;Bed level       Functional mobility during ADLs: Maximal assistance;+2 for physical assistance(bed mobility only) General ADL Comments: pt significantly limited by decreased activity tolerance, pain in BLEs, and generalized weakness     Vision Baseline Vision/History: Wears glasses Wears Glasses: Reading only Patient Visual Report: Other (comment)(reports "black spot in vision" for 1 month ) Vision Assessment?: No apparent visual deficits Additional Comments: further assessment may be warrented     Perception     Praxis      Pertinent Vitals/Pain Pain Assessment: Faces Faces Pain Scale: Hurts even more Pain Location:  B LES Pain Descriptors / Indicators: Discomfort;Grimacing;Guarding;Moaning;Aching Pain Intervention(s): Limited activity within patient's tolerance;Repositioned;Monitored during session     Hand Dominance Right   Extremity/Trunk Assessment Upper Extremity Assessment Upper Extremity Assessment: Generalized  weakness   Lower Extremity Assessment Lower Extremity Assessment: Generalized weakness       Communication Communication Communication: No difficulties   Cognition Arousal/Alertness: Awake/alert Behavior During Therapy: WFL for tasks assessed/performed Overall Cognitive Status: Within Functional Limits for tasks assessed                                 General Comments: able to follow commands and engage with therapist, noted decreased recall of events leading to hospital.    General Comments  daughter present and supportive    Exercises     Shoulder Instructions      Home Living Family/patient expects to be discharged to:: Private residence Living Arrangements: Alone Available Help at Discharge: Family;Available PRN/intermittently Type of Home: House Home Access: Stairs to enter Entrance Stairs-Number of Steps: 14 Entrance Stairs-Rails: Left Home Layout: Two level;Able to live on main level with bedroom/bathroom Alternate Level Stairs-Number of Steps: flight  Alternate Level Stairs-Rails: Left Bathroom Shower/Tub: Walk-in shower   Bathroom Toilet: Handicapped height     Home Equipment: Environmental consultant - 2 wheels;Bedside commode;Shower seat - built in;Grab bars - tub/shower          Prior Functioning/Environment Level of Independence: Independent        Comments: retired, + driving, independent ADL/IADLs         OT Problem List: Decreased strength;Decreased range of motion;Decreased activity tolerance;Impaired balance (sitting and/or standing);Decreased safety awareness;Decreased knowledge of use of DME or AE;Decreased knowledge of precautions;Pain      OT Treatment/Interventions: Self-care/ADL training;Therapeutic exercise;Energy conservation;DME and/or AE instruction;Therapeutic activities;Patient/family education;Balance training    OT Goals(Current goals can be found in the care plan section) Acute Rehab OT Goals Patient Stated Goal: to feel  better OT Goal Formulation: With patient Time For Goal Achievement: 12/29/17 Potential to Achieve Goals: Good  OT Frequency: Min 2X/week   Barriers to D/C:            Co-evaluation              AM-PAC PT "6 Clicks" Daily Activity     Outcome Measure Help from another person eating meals?: A Little Help from another person taking care of personal grooming?: A Little Help from another person toileting, which includes using toliet, bedpan, or urinal?: Total Help from another person bathing (including washing, rinsing, drying)?: A Lot Help from another person to put on and taking off regular upper body clothing?: A Little Help from another person to put on and taking off regular lower body clothing?: Total 6 Click Score: 13   End of Session Nurse Communication: Mobility status  Activity Tolerance: Patient tolerated treatment well Patient left: in bed;with call bell/phone within reach;with bed alarm set;with family/visitor present  OT Visit Diagnosis: Other abnormalities of gait and mobility (R26.89);Muscle weakness (generalized) (M62.81);Pain Pain - Right/Left: (bil) Pain - part of body: Knee;Leg                Time: 6759-1638 OT Time Calculation (min): 30 min Charges:  OT General Charges $OT Visit: 1 Visit OT Evaluation $OT Eval Moderate Complexity: 1 Mod OT Treatments $Self Care/Home Management : 8-22 mins  Delight Stare, OT Acute Rehabilitation Services Pager [phone] Office [place] [address]  Shardea Cwynar 12/15/2017, 10:17 AM

## 2017-12-15 NOTE — Progress Notes (Addendum)
Rehab Admissions Coordinator Note:  Per PT and OT recommendations, Patient was screened by Jhonnie Garner for appropriateness for an Inpatient Acute Rehab Consult. Noted transfers OOB were deferred with workup ongoing for LLE pain. At this time, [facility] will follow for progress with therapies prior to requesting IP Rehab Consult Order.   Jhonnie Garner 12/15/2017, 4:16 PM  I can be reached at [phone].

## 2017-12-15 NOTE — Evaluation (Signed)
Physical Therapy Evaluation Patient Details Name: Ebony Scott MRN: [id number] DOB: August 09, 1950 Today's Date: 12/15/2017   History of Present Illness  PT is a 67 y.o. female with a hx of anxiety, depression, asthma, breast cancer, chronic back pain, chronic neck pain, diverticulosis, DVT, fibromyalgia, GERD, Graves' disease, hyperlipidemia, IBS, and osteoporosis who was found down by her daughter confused and soiled w/ vomit and feces. Last know to be well time on Wed. CT negative for intracranial pathology.  Found with R LE DVT and small B PE, SIRS and low grade rhabdomyolysis per internal medicine.     Clinical Impression  Pt presents with moderate to severe limitations to functional mobility due to pain, weakness, limited ROM, resulting in dependencies for basic mobility including changing positions and moving around.  Pt previously active and independent, motivated to return to same.  Will benefit from therapies to address deficits.  Recommend CIR consult to screen for potential admission; PT will initiate rehab in acute setting.      Follow Up Recommendations CIR    Equipment Recommendations  Rolling walker with 5" wheels    Recommendations for Other Services Rehab consult     Precautions / Restrictions Precautions Precautions: Fall Precaution Comments: bed alarm Restrictions Weight Bearing Restrictions: No      Mobility  Bed Mobility Overal bed mobility: Needs Assistance Bed Mobility: Rolling;Supine to Sit;Sit to Supine Rolling: Min assist(roll to left with rail assist) Sidelying to sit: Max assist;+2 for physical assistance Supine to sit: Max assist;HOB elevated Sit to supine: Max assist Sit to sidelying: Total assist General bed mobility comments: improved from earlier this date (see OT notes), with less severe pain and more localized to lower left leg.  assisted legs toward EOB and instructed on use of rails to roll/scoot to right EOB.  able to lift trunk into sitting  from [facility] at 40 degrees.  scooting to right with max assist, use of pads, and instruction for technique, with improveing use of UE to assist but still limited due to LE pain  Transfers                 General transfer comment: deferred as pt indicated MD wants to xray LLE; suspect will do well when premedicated and with RW or other assistive device once LLE cleared  Ambulation/Gait             General Gait Details: deferred  Stairs            Wheelchair Mobility    Modified Rankin (Stroke Patients Only)       Balance Overall balance assessment: Needs assistance Sitting-balance support: No upper extremity supported;Feet supported Sitting balance-Leahy Scale: Fair Sitting balance - Comments: able to sit unassisted with/without UE and no LOB, but needs guarding for weightshifting and scooting                                     Pertinent Vitals/Pain Pain Assessment: Faces Faces Pain Scale: Hurts even more Pain Location: mostly LLE at foot and ankle Pain Descriptors / Indicators: Aching;Guarding;Grimacing Pain Intervention(s): Limited activity within patient's tolerance;Monitored during session;Repositioned(limited WB as pt says xray to left LLE pending)    Home Living Family/patient expects to be discharged to:: Private residence Living Arrangements: Alone Available Help at Discharge: Family;Available PRN/intermittently Type of Home: House Home Access: Stairs to enter Entrance Stairs-Rails: Left Entrance Stairs-Number of Steps: 14 Home Layout: Two  level;Able to live on main level with bedroom/bathroom Home Equipment: Gilford Rile - 2 wheels;Bedside commode;Shower seat - built in;Grab bars - tub/shower Additional Comments: daughter willing to assist at dc per pt    Prior Function Level of Independence: Independent         Comments: retired, + driving, independent ADL/IADLs      Hand Dominance   Dominant Hand: Right    Extremity/Trunk  Assessment   Upper Extremity Assessment Upper Extremity Assessment: Defer to OT evaluation    Lower Extremity Assessment Lower Extremity Assessment: Generalized weakness;LLE deficits/detail LLE Deficits / Details: incr pain in ankle/foot and tender to touch and with any load bearing even in sitting LLE: Unable to fully assess due to pain    Cervical / Trunk Assessment Cervical / Trunk Assessment: Normal  Communication   Communication: No difficulties  Cognition Arousal/Alertness: Awake/alert Behavior During Therapy: WFL for tasks assessed/performed Overall Cognitive Status: Within Functional Limits for tasks assessed                                 General Comments: alert and interactive, has vague memories of events prior to admission,       General Comments General comments (skin integrity, edema, etc.): pt with multiple skin discoloration she attributes to fall;     Exercises     Assessment/Plan    PT Assessment Patient needs continued PT services  PT Problem List Pain;Decreased knowledge of use of DME;Decreased mobility;Decreased activity tolerance;Decreased range of motion;Decreased strength       PT Treatment Interventions Modalities;Patient/family education;Therapeutic exercise;Therapeutic activities;Functional mobility training;Gait training;DME instruction    PT Goals (Current goals can be found in the Care Plan section)  Acute Rehab PT Goals Patient Stated Goal: return to living alone/independently PT Goal Formulation: With patient Time For Goal Achievement: 12/29/17 Potential to Achieve Goals: Good    Frequency Min 3X/week   Barriers to discharge Decreased caregiver support lives alone    Co-evaluation               AM-PAC PT "6 Clicks" Daily Activity  Outcome Measure Difficulty turning over in bed (including adjusting bedclothes, sheets and blankets)?: A Lot Difficulty moving from lying on back to sitting on the side of the bed?  : A Lot Difficulty sitting down on and standing up from a chair with arms (e.g., wheelchair, bedside commode, etc,.)?: Unable Help needed moving to and from a bed to chair (including a wheelchair)?: Total Help needed walking in hospital room?: Total Help needed climbing 3-5 steps with a railing? : Total 6 Click Score: 8    End of Session   Activity Tolerance: Patient tolerated treatment well;Patient limited by pain Patient left: in bed;with call bell/phone within reach Nurse Communication: Mobility status PT Visit Diagnosis: Muscle weakness (generalized) (M62.81);History of falling (Z91.81);Difficulty in walking, not elsewhere classified (R26.2);Pain Pain - Right/Left: Left Pain - part of body: Ankle and joints of foot    Time: 1110-1141 PT Time Calculation (min) (ACUTE ONLY): 31 min   Charges:   PT Evaluation $PT Eval Low Complexity: 1 Low PT Treatments $Therapeutic Activity: 8-22 mins        Kearney Hard, PT, DPT, MS Board Certified Geriatric Clinical Specialist  Kearney Hard [facility] 12/15/2017, 11:59 AM

## 2017-12-15 NOTE — Progress Notes (Addendum)
[place] for Heparin Indication: DVT/small acute bilateral PE  Allergies  Allergen Reactions  . Dust Mite Extract Shortness Of Breath and Other (See Comments)    "sneezing" (02/20/2012)  . Molds & Smuts Shortness Of Breath  . Morphine Sulfate Itching  . Other Shortness Of Breath and Other (See Comments)    Grass and weeds "sneezing; filled sinuses" (02/20/2012)  . Penicillins Rash and Other (See Comments)    "welts" (02/20/2012) Has patient had a PCN reaction causing immediate rash, facial/tongue/throat swelling, SOB or lightheadedness with hypotension: Unknown Has patient had a PCN reaction causing severe rash involving mucus membranes or skin necrosis: No Has patient had a PCN reaction that required hospitalization: No Has patient had a PCN reaction occurring within the last 10 years: No If all of the above answers are "NO", then may proceed with Cephalosporin use.   Marland Kitchen Reclast [Zoledronic Acid] Other (See Comments)    Fever, Put in hospital, dr said it was a reaction from a reaction   . Rofecoxib Swelling    Vioxx REACTION: feet swelling  . Shrimp Flavor Anaphylaxis    ALL SHELLFISH  . Tetracycline Hcl Nausea And Vomiting  . Xolair [Omalizumab] Other (See Comments)    Caused Blood clot  . Dilaudid [Hydromorphone Hcl] Itching  . Hydrocodone-Acetaminophen Nausea And Vomiting  . Levofloxacin Other (See Comments)    REACTION: GI upset  . Oxycodone Hcl Nausea And Vomiting  . Paroxetine Nausea And Vomiting    Paxil   . Celecoxib Swelling    Feet swelling  . Diltiazem Swelling  . Lactose Intolerance (Gi) Other (See Comments)    Bloating and gas  . Tree Extract Other (See Comments)    "tested and told I was allergic to it; never experienced a reaction to it" (02/20/2012)    Patient Measurements: Height: 5\' 1"  (154.9 cm) Weight: 133 lb 2.5 oz (60.4 kg) IBW/kg (Calculated) : 47.8 Heparin Dosing Weight:  61.2 kg  Vital Signs: Temp: 99.4 F  (37.4 C) (10/27 1408) Temp Source: Oral (10/27 1408) BP: 149/67 (10/27 1408) Pulse Rate: 90 (10/27 1408)  Labs: Recent Labs    12/13/17 1051 12/13/17 1058  12/14/17 0434 12/14/17 1008 12/14/17 1817 12/15/17 0342  HGB 14.5 15.6*  --  12.5  --   --  10.7*  HCT 44.3 46.0  --  39.3  --   --  32.3*  PLT 201  --   --  203  --   --  201  HEPARINUNFRC  --   --    < >  --  0.61 0.57 0.47  CREATININE 1.47* 1.40*  --  1.19*  --   --  1.07*  CKTOTAL 2,446*  --   --  1,465*  --   --  559*   < > = values in this interval not displayed.    Estimated Creatinine Clearance: 41.4 mL/min (A) (by C-G formula based on SCr of 1.07 mg/dL (H)).   Medical History: Past Medical History:  Diagnosis Date  . Allergic rhinitis   . Allergy    SEASONAL  . Anemia   . Anxiety    pt denies  . Arthritis    "mostly the hands" (02/20/2012)  . Asthma   . Breast cancer ([place]) 1998   in remission  . Cataract    REMOVED  . Chronic lower back pain   . Chronic neck pain   . Complication of anesthesia    "had hard  time waking up from it several times" (02/20/2012)  . Depression    "some; don't take anything for it" (02/20/2012)  . Diverticulosis   . DVT (deep venous thrombosis) ([place])   . Exertional dyspnea   . Fibromyalgia 11/2011  . GERD (gastroesophageal reflux disease)   . Graves disease   . Headache(784.0)    "related to allergies; more at different times during the year" (02/20/2012)  . Hemorrhoids   . Hiatal hernia    back and neck  . Hx of adenomatous colonic polyps 04/12/2016  . Hypercholesteremia    good cholesterol is high  . Hypothyroidism   . IBS (irritable bowel syndrome)   . Moderate persistent asthma    -FeV1 72% 2011, -IgE 102 2011, CT sinus Neg 2011  . Osteoporosis    on reclast yearly  . Pneumonia 04/2011; ~ 11/2011   "double; single" (02/20/2012)    Medications:  Scheduled:  . gabapentin  300 mg Oral BID  . lactase  6,000 Units Oral TID WC  . methimazole  5 mg Oral BID  .  mirtazapine  30 mg Oral QHS  . mometasone-formoterol  1 puff Inhalation BID  . montelukast  10 mg Oral Daily  . pantoprazole  40 mg Oral Daily  . potassium chloride  40 mEq Oral BID  . umeclidinium bromide  1 puff Inhalation Daily   Infusions:  . sodium chloride 100 mL/hr at 12/15/17 0858  . heparin 950 Units/hr (12/14/17 1422)    Assessment: Pt is a 1 YOF presenting with altered mental status after falling at home. Pt does have history of DVT; no anticoagulation PTA. CTA reveals small acute pulmonary emboli in both lungs. Head CT is non-significant. Bilateral lower extremities duplex showed RLE DVT.  Heparin continues to be therapeutic. Plan to transition to PO once stable.   Goal of Therapy:  Heparin level 0.3-0.7 units/ml Monitor platelets by anticoagulation protocol: Yes   Plan:  Cont heparin to 950 units/hr Daily HL and CBC F/u with PO plan  Onnie Boer, PharmD, BCIDP, AAHIVP, CPP Infectious Disease Pharmacist Pager: [phone] 12/15/2017 2:24 PM  Addendum:  Plan to change to Xarelto today. Xarelto 15mg  BID x21d then 20mg  qday Monitor for bleeding  Onnie Boer, PharmD, BCIDP, AAHIVP, CPP Infectious Disease Pharmacist Pager: [phone] 12/15/2017 4:57 PM

## 2017-12-16 ENCOUNTER — Inpatient Hospital Stay (HOSPITAL_COMMUNITY): Payer: Medicare Other

## 2017-12-16 ENCOUNTER — Ambulatory Visit: Payer: Medicare Other | Admitting: Cardiology

## 2017-12-16 ENCOUNTER — Ambulatory Visit: Payer: Medicare Other | Admitting: Family Medicine

## 2017-12-16 DIAGNOSIS — M25569 Pain in unspecified knee: Secondary | ICD-10-CM

## 2017-12-16 DIAGNOSIS — T796XXA Traumatic ischemia of muscle, initial encounter: Secondary | ICD-10-CM

## 2017-12-16 DIAGNOSIS — E0500 Thyrotoxicosis with diffuse goiter without thyrotoxic crisis or storm: Secondary | ICD-10-CM

## 2017-12-16 DIAGNOSIS — G894 Chronic pain syndrome: Secondary | ICD-10-CM

## 2017-12-16 DIAGNOSIS — Z853 Personal history of malignant neoplasm of breast: Secondary | ICD-10-CM

## 2017-12-16 DIAGNOSIS — I824Y1 Acute embolism and thrombosis of unspecified deep veins of right proximal lower extremity: Secondary | ICD-10-CM

## 2017-12-16 DIAGNOSIS — D72829 Elevated white blood cell count, unspecified: Secondary | ICD-10-CM

## 2017-12-16 DIAGNOSIS — M797 Fibromyalgia: Secondary | ICD-10-CM

## 2017-12-16 LAB — IRON AND TIBC
Iron: 14 ug/dL — ABNORMAL LOW (ref 28–170)
Saturation Ratios: 10 % — ABNORMAL LOW (ref 10.4–31.8)
TIBC: 140 ug/dL — ABNORMAL LOW (ref 250–450)
UIBC: 126 ug/dL

## 2017-12-16 LAB — CBC
HCT: 31.7 % — ABNORMAL LOW (ref 36.0–46.0)
Hemoglobin: 10 g/dL — ABNORMAL LOW (ref 12.0–15.0)
MCH: 31.6 pg (ref 26.0–34.0)
MCHC: 31.5 g/dL (ref 30.0–36.0)
MCV: 100.3 fL — ABNORMAL HIGH (ref 80.0–100.0)
Platelets: 215 10*3/uL (ref 150–400)
RBC: 3.16 MIL/uL — ABNORMAL LOW (ref 3.87–5.11)
RDW: 14.3 % (ref 11.5–15.5)
WBC: 12.5 10*3/uL — ABNORMAL HIGH (ref 4.0–10.5)
nRBC: 0 % (ref 0.0–0.2)

## 2017-12-16 LAB — BASIC METABOLIC PANEL
Anion gap: 5 (ref 5–15)
BUN: 17 mg/dL (ref 8–23)
CO2: 21 mmol/L — ABNORMAL LOW (ref 22–32)
Calcium: 6.9 mg/dL — ABNORMAL LOW (ref 8.9–10.3)
Chloride: 111 mmol/L (ref 98–111)
Creatinine, Ser: 1.01 mg/dL — ABNORMAL HIGH (ref 0.44–1.00)
GFR calc Af Amer: >60 mL/min (ref >60)
GFR calc non Af Amer: 55 mL/min — ABNORMAL LOW (ref >60)
Glucose, Bld: 91 mg/dL (ref 70–99)
Potassium: 4.8 mmol/L (ref 3.5–5.1)
Sodium: 137 mmol/L (ref 135–145)

## 2017-12-16 LAB — RETICULOCYTES
Immature Retic Fract: 28.3 % — ABNORMAL HIGH (ref 2.3–15.9)
RBC.: 3.29 MIL/uL — ABNORMAL LOW (ref 3.87–5.11)
Retic Count, Absolute: 30.6 10*3/uL (ref 19.0–186.0)
Retic Ct Pct: 0.9 % (ref 0.4–3.1)

## 2017-12-16 LAB — FERRITIN: Ferritin: 712 ng/mL — ABNORMAL HIGH (ref 11–307)

## 2017-12-16 LAB — CK: Total CK: 327 U/L — ABNORMAL HIGH (ref 38–234)

## 2017-12-16 LAB — FOLATE: Folate: 23.3 ng/mL (ref >5.9)

## 2017-12-16 LAB — VITAMIN B12: Vitamin B-12: 3282 pg/mL — ABNORMAL HIGH (ref 180–914)

## 2017-12-16 LAB — PHOSPHORUS: Phosphorus: 1.2 mg/dL — ABNORMAL LOW (ref 2.5–4.6)

## 2017-12-16 MED ORDER — SODIUM PHOSPHATES 45 MMOLE/15ML IV SOLN
30.0000 mmol | Freq: Once | INTRAVENOUS | Status: AC
  Administered 2017-12-16: 30 mmol via INTRAVENOUS
  Filled 2017-12-16: qty 10

## 2017-12-16 MED ORDER — COLCHICINE 0.6 MG PO TABS
0.6000 mg | ORAL_TABLET | Freq: Two times a day (BID) | ORAL | Status: DC
  Administered 2017-12-16 – 2017-12-20 (×9): 0.6 mg via ORAL
  Filled 2017-12-16 (×8): qty 1

## 2017-12-16 NOTE — Discharge Instructions (Addendum)
Follow with Primary MD [name], MD in 7 days   Get CBC, CMP, 2 view Chest X ray -  checked  by Primary MD or SNF MD in 5-7 days    Activity: As tolerated with Full fall precautions use walker/cane & assistance as needed  Disposition SNF  Diet: Heart Healthy   Special Instructions: If you have smoked or chewed Tobacco  in the last 2 yrs please stop smoking, stop any regular Alcohol  and or any Recreational drug use.  On your next visit with your primary care physician please Get Medicines reviewed and adjusted.  Please request your Prim.MD to go over all Hospital Tests and Procedure/Radiological results at the follow up, please get all Hospital records sent to your Prim MD by signing hospital release before you go home.  If you experience worsening of your admission symptoms, develop shortness of breath, life threatening emergency, suicidal or homicidal thoughts you must seek medical attention immediately by calling 911 or calling your MD immediately  if symptoms less severe.  You Must read complete instructions/literature along with all the possible adverse reactions/side effects for all the Medicines you take and that have been prescribed to you. Take any new Medicines after you have completely understood and accpet all the possible adverse reactions/side effects.      Information on my medicine - XARELTO (rivaroxaban)   WHY WAS XARELTO PRESCRIBED FOR YOU? Xarelto was prescribed to treat blood clots that may have been found in the veins of your legs (deep vein thrombosis) or in your lungs (pulmonary embolism) and to reduce the risk of them occurring again.  What do you need to know about Xarelto? The starting dose is one 15 mg tablet taken TWICE daily with food for the FIRST 21 DAYS then on (enter date) 01/05/18  the dose is changed to one 20 mg tablet taken ONCE A DAY with your evening meal.  DO NOT stop taking Xarelto without talking to the health care provider who  prescribed the medication.  Refill your prescription for 20 mg tablets before you run out.  After discharge, you should have regular check-up appointments with your healthcare provider that is prescribing your Xarelto.  In the future your dose may need to be changed if your kidney function changes by a significant amount.  What do you do if you miss a dose? If you are taking Xarelto TWICE DAILY and you miss a dose, take it as soon as you remember. You may take two 15 mg tablets (total 30 mg) at the same time then resume your regularly scheduled 15 mg twice daily the next day.  If you are taking Xarelto ONCE DAILY and you miss a dose, take it as soon as you remember on the same day then continue your regularly scheduled once daily regimen the next day. Do not take two doses of Xarelto at the same time.   Important Safety Information Xarelto is a blood thinner medicine that can cause bleeding. You should call your healthcare provider right away if you experience any of the following: ? Bleeding from an injury or your nose that does not stop. ? Unusual colored urine (red or dark brown) or unusual colored stools (red or black). ? Unusual bruising for unknown reasons. ? A serious fall or if you hit your head (even if there is no bleeding).  Some medicines may interact with Xarelto and might increase your risk of bleeding while on Xarelto. To help avoid this, consult your  healthcare provider or pharmacist prior to using any new prescription or non-prescription medications, including herbals, vitamins, non-steroidal anti-inflammatory drugs (NSAIDs) and supplements.  This website has more information on Xarelto: [email or web address]

## 2017-12-16 NOTE — Progress Notes (Signed)
Physical Therapy Treatment Patient Details Name: Ebony Scott MRN: [id number] DOB: 08-09-1950 Today's Date: 12/16/2017    History of Present Illness PT is a 67 y.o. female with a hx of anxiety, depression, asthma, breast cancer, chronic back pain, chronic neck pain, diverticulosis, DVT, fibromyalgia, GERD, Graves' disease, hyperlipidemia, IBS, and osteoporosis who was found down by her daughter confused and soiled w/ vomit and feces. Last know to be well time on Wed. CT negative for intracranial pathology.  Found with R LE DVT and small B PE, SIRS and low grade rhabdomyolysis per internal medicine.     PT Comments    Pt limited again this session by pain in BLEs, L>R.  Continues to demo profound weakness in BLEs, decreased activity tolerance, and decreased sitting balance.  Was only able to come to EOB and complete minimal LE ROM at EOB 2/2 pain and weakness.  Would benefit from continued PT in acute setting to progress pt prior to f/u with rehab at next level of care.      Follow Up Recommendations  CIR     Equipment Recommendations  Other (comment)(to be determined at next level of care)    Recommendations for Other Services       Precautions / Restrictions Precautions Precautions: Fall Restrictions Weight Bearing Restrictions: No    Mobility  Bed Mobility Overal bed mobility: Needs Assistance Bed Mobility: Supine to Sit;Sit to Supine     Supine to sit: Max assist;HOB elevated Sit to supine: +2 for physical assistance(draw sheet)   General bed mobility comments: requiring significant time and assist to come to EOB 2/2 pain in BLEs  Transfers                 General transfer comment: deferred due to pain  Ambulation/Gait                 Stairs             Wheelchair Mobility    Modified Rankin (Stroke Patients Only)       Balance Overall balance assessment: Needs assistance Sitting-balance support: Bilateral upper extremity  supported Sitting balance-Leahy Scale: Poor Sitting balance - Comments: requires up to mod assist to maintain sitting balance EOB without LE support Postural control: Right lateral lean                                  Cognition Arousal/Alertness: Awake/alert Behavior During Therapy: WFL for tasks assessed/performed Overall Cognitive Status: Within Functional Limits for tasks assessed                                        Exercises      General Comments General comments (skin integrity, edema, etc.): daughter present during session      Pertinent Vitals/Pain Pain Assessment: 0-10 Pain Score: 4  Pain Location: BLEs, L>R Pain Descriptors / Indicators: Guarding;Grimacing;Moaning Pain Intervention(s): Limited activity within patient's tolerance;Monitored during session;Premedicated before session;Relaxation;Heat applied    Home Living                      Prior Function            PT Goals (current goals can now be found in the care plan section) Acute Rehab PT Goals Patient Stated Goal: to figure out why her ankles  hurt so badly PT Goal Formulation: With patient/family Time For Goal Achievement: 12/29/17 Potential to Achieve Goals: Good Progress towards PT goals: Progressing toward goals    Frequency    Min 4X/week      PT Plan Frequency needs to be updated    Co-evaluation              AM-PAC PT "6 Clicks" Daily Activity  Outcome Measure  Difficulty turning over in bed (including adjusting bedclothes, sheets and blankets)?: A Lot Difficulty moving from lying on back to sitting on the side of the bed? : Unable Difficulty sitting down on and standing up from a chair with arms (e.g., wheelchair, bedside commode, etc,.)?: Unable Help needed moving to and from a bed to chair (including a wheelchair)?: Total Help needed walking in hospital room?: Total Help needed climbing 3-5 steps with a railing? : Total 6 Click  Score: 7    End of Session   Activity Tolerance: Patient limited by fatigue;Patient limited by pain Patient left: in bed;with call bell/phone within reach;with family/visitor present Nurse Communication: Mobility status PT Visit Diagnosis: Unsteadiness on feet (R26.81);Muscle weakness (generalized) (M62.81);Difficulty in walking, not elsewhere classified (R26.2);History of falling (Z91.81) Pain - Right/Left: Left Pain - part of body: Ankle and joints of foot     Time: 1020-1055 PT Time Calculation (min) (ACUTE ONLY): 35 min  Charges:  $Therapeutic Exercise: 8-22 mins $Therapeutic Activity: 8-22 mins                        Michel Santee 12/16/2017, 11:04 AM

## 2017-12-16 NOTE — Care Management Important Message (Signed)
Important Message  Patient Details  Name: Ebony Scott MRN: [id number] Date of Birth: [birth year]   Medicare Important Message Given:  Yes    Orbie Pyo 12/16/2017, 4:26 PM

## 2017-12-16 NOTE — Progress Notes (Signed)
OT Cancellation Note  Patient Details Name: [name] MRN: [id number] DOB: Dec 03, 1950   Cancelled Treatment:    Reason Eval/Treat Not Completed: Fatigue/lethargy limiting ability to participate. Family present in room who reports pt has been sleeping for 2 hours but unable to remain alert for OT intervention.  OT will attempt again at next available time.   Gypsy Decant, MS, OTR/L 12/16/2017, 1:37 PM

## 2017-12-16 NOTE — Care Management (Signed)
WHO SPOKE WITH/ COMPANY/ PHONE NUMBER: [facility]  MEDICATION/ DOSE: Xarelto (ALL DOSES)  AUTH REQUIRED? IF SO, PHONE NUMBER: no  COVERED?: Yes, tier 2  CO-PAY FOR 30 DAY SUPPLY AT RETAIL PHARMACY: $0  CO-PAY FOR 90 DAY SUPPLY AT MAIL-ORDER PHARMACY: $0  ALTERNATIVES IF MEDICATION NOT COVERED:   ADDITIONAL NOTES:

## 2017-12-16 NOTE — Progress Notes (Signed)
PROGRESS NOTE    Ebony Scott  GGY:[id number] DOB: 1950/05/06 DOA: 12/13/2017 PCP: [name], MD    Brief Narrative:  67 y.o.femalewith a hx of anxiety, depression, asthma, breast cancer, chronic back pain, chronic neck pain, diverticulosis, DVT, fibromyalgia, GERD, Graves' disease, hyperlipidemia, IBS, and osteoporosis who was found down by her daughter confused and soiled w/ vomit and feces. She does not remember what happened or having any symptoms prior to losing consciousness. In the ED her vitals were unremarkable and a CXR did not show any significant abnormality. CT head did not show any acute intracranial pathology. Bilateral lower extremities duplex showed RLE DVT.  CT angiogram of the chest shows bilateral small pulmonary embolism.    Assessment & Plan:   Principal Problem: Acute bilateral PE Active Problems:   Hyperthyroidism   Hyperlipidemia   GERD   Pseudogout   Chronic diastolic CHF (congestive heart failure) (HCC)   Rhabdomyolysis   Acute deep vein thrombosis (DVT) of right lower extremity (HCC)   Acute bilateral PE and acute right lower extremity DVT She was started on IV heparin and transition to oral Xarelto today.   SIRS, sepsis ruled out No evidence of infection and antibiotics were stopped. There is probably from rhabdomyolysis and acute pulmonary embolism.   Acute rhabdomyolysis; Improving CK, but the levels have not normalized and she continues to have persistent bilateral  lower extremity pain and tenderness superimposed on left ankle pain and swelling.  Renal function and urine output continues to improve with IV fluids, recommend checking another CK level in the morning.  Pain control with oral tramadol and tylenol, will add IV fentanyl for severe and breakthrough pain.     Chronic diastolic heart failure she appears euvolemic continue to monitor.   Hyperlipidemia holding statin due to acute rhabdomyolysis at this time.   History  of hyper parathyroidism TSH is around 5,  patient's PCP recommended holding methimazole for 1 month from October 17.   GERD Continue with Protonix.   Severe left ankle pain and swelling,  Differential include, fracture vs gout vs pseudo gout flare up.  She will need x rays of the left ankle  to evaluate for fracture and check for effusion.  She was empirically started on colchicine, if her pain does not improve in the next 12 hours , she might need IV steroids.     Severe Hypophosphatemia: Ordered 3 runs of sodium phosphate and repeat phosphorus level in am. Nutrition will be consulted, will need to watch for refeeding syndrome.   Hypoalbuminemia:  Nutrition consulted for protein supplementation   Moderate to severe generalized weakness associated with lethargy  probably from laying on the floor from wednesday till Friday, continuous rhabdomyolysis and severe hypophosphatemia.  In view of the ongoing electrolyte deficiencies, requiring IV replacements, ongoing rhabdomyolysis and pt unable to bear any weight on the left ankle and unable to participate in therapy, due to lethargy, she will require in patient status.    Hypocalcemia:  Will need replacements    Elevated liver function panel: Due to rhabdomyolysis. Repeat panel in am.    Iron deficiency anemia:  Iron supplementation will be added.    Leukocytosis/ low grade fever:  Suspect from gout vs rule out septic arthritis. Get x rays of the left ankle to look for effusion,  CBC in am.    DVT prophylaxis: Heparin transitioned to Xarelto Code Status: Full code Family Communication: discussed with daughter at bedside  disposition Plan: Pending clinical improvement  Consultants:   None   Procedures: None   Antimicrobials: None   Subjective: Discussed the plan with the patient and the daughter over the phone.  Pt reports severe pain in the left ankle, not able to bear weight on the left ankle.  Lethargic,  briefly answers questions, and goes back to sleep, reports severe pain, and fatigue.   Objective: Vitals:   12/16/17 0658 12/16/17 0819 12/16/17 0853 12/16/17 1225  BP:   (!) 136/58   Pulse:   97   Resp:  17 18   Temp: 100.2 F (37.9 C) 100 F (37.8 C)  99.2 F (37.3 C)  TempSrc: Oral Oral  Oral  SpO2:  97% 97%   Weight:      Height:        Intake/Output Summary (Last 24 hours) at 12/16/2017 1346 Last data filed at 12/16/2017 0800 Gross per 24 hour  Intake -  Output 1100 ml  Net -1100 ml   Filed Weights   12/13/17 1026 12/14/17 0207  Weight: 61.2 kg 60.4 kg    Examination:  General exam: In mild to moderate distress from pain in the lleft ankle. Has a heating pad on the ankle.  Respiratory system: air entry diminished at bases, no wheezing or rhonchi.  Cardiovascular system: S1 & S2 heard, RRR. No JV No pedal edema. Gastrointestinal system: Abdomen is soft NT ND BS+ Central nervous system: lethargic, briefly opening eyes to answer questions, able to move all extremities.  Extremities: decreased strength in bilateral lower extremities. Left ankle swollen and tender .  Skin: No rashes, lesions or ulcers Psychiatry: tired.     Data Reviewed: I have personally reviewed following labs and imaging studies  CBC: Recent Labs  Lab 12/13/17 1051 12/13/17 1058 12/14/17 0434 12/15/17 0342 12/16/17 0414  WBC 13.4*  --  12.8* 10.4 12.5*  NEUTROABS 10.2*  --  10.1*  --   --   HGB 14.5 15.6* 12.5 10.7* 10.0*  HCT 44.3 46.0 39.3 32.3* 31.7*  MCV 99.3  --  100.0 98.2 100.3*  PLT 201  --  203 201 361   Basic Metabolic Panel: Recent Labs  Lab 12/13/17 1051 12/13/17 1058 12/13/17 1849 12/14/17 0434 12/15/17 0342 12/16/17 0414  NA 139 138  --  141 138 137  K 3.7 4.0  --  3.7 3.3* 4.8  CL 101 103  --  105 109 111  CO2 22  --   --  21* 21* 21*  GLUCOSE 126* 126*  --  103* 98 91  BUN 29* 44*  --  24* 20 17  CREATININE 1.47* 1.40*  --  1.19* 1.07* 1.01*  CALCIUM 8.5*   --   --  8.0* 7.1* 6.9*  MG  --   --  2.3  --   --   --   PHOS  --   --  2.1*  --   --  1.2*   GFR: Estimated Creatinine Clearance: 43.8 mL/min (A) (by C-G formula based on SCr of 1.01 mg/dL (H)). Liver Function Tests: Recent Labs  Lab 12/13/17 1051 12/15/17 0342  AST 87* 85*  ALT 55* 61*  ALKPHOS 72 58  BILITOT 2.3* 0.9  PROT 6.7 5.2*  ALBUMIN 2.8* 2.0*   No results for input(s): LIPASE, AMYLASE in the last 168 hours. No results for input(s): AMMONIA in the last 168 hours. Coagulation Profile: No results for input(s): INR, PROTIME in the last 168 hours. Cardiac Enzymes: Recent Labs  Lab 12/13/17 1051  12/14/17 0434 12/15/17 0342 12/16/17 0414  CKTOTAL 2,446* 1,465* 559* 327*   BNP (last 3 results) No results for input(s): PROBNP in the last 8760 hours. HbA1C: No results for input(s): HGBA1C in the last 72 hours. CBG: No results for input(s): GLUCAP in the last 168 hours. Lipid Profile: No results for input(s): CHOL, HDL, LDLCALC, TRIG, CHOLHDL, LDLDIRECT in the last 72 hours. Thyroid Function Tests: No results for input(s): TSH, T4TOTAL, FREET4, T3FREE, THYROIDAB in the last 72 hours. Anemia Panel: Recent Labs    12/16/17 0937 12/16/17 0953  VITAMINB12  --  3,282*  FOLATE  --  23.3  FERRITIN  --  712*  TIBC  --  140*  IRON  --  14*  RETICCTPCT 0.9  --    Sepsis Labs: Recent Labs  Lab 12/13/17 1059 12/13/17 1629  LATICACIDVEN 2.28* 1.52    Recent Results (from the past 240 hour(s))  Blood Culture (routine x 2)     Status: None (Preliminary result)   Collection Time: 12/13/17 11:38 AM  Result Value Ref Range Status   Specimen Description BLOOD RIGHT ANTECUBITAL  Final   Special Requests   Final    BOTTLES DRAWN AEROBIC AND ANAEROBIC Blood Culture results may not be optimal due to an excessive volume of blood received in culture bottles   Culture   Final    NO GROWTH 2 DAYS Performed at [facility], [place] [address], [place], [place]  [address]    Report Status PENDING  Incomplete  Blood Culture (routine x 2)     Status: None (Preliminary result)   Collection Time: 12/13/17 11:53 AM  Result Value Ref Range Status   Specimen Description BLOOD BLOOD RIGHT HAND  Final   Special Requests   Final    BOTTLES DRAWN AEROBIC AND ANAEROBIC Blood Culture adequate volume   Culture   Final    NO GROWTH 2 DAYS Performed at [facility], [place] [address] [place], [place] [address]    Report Status PENDING  Incomplete  Urine culture     Status: None   Collection Time: 12/13/17  2:41 PM  Result Value Ref Range Status   Specimen Description URINE, RANDOM  Final   Special Requests NONE  Final   Culture   Final    NO GROWTH Performed at [facility], [address] [place], [place] [address]    Report Status 12/14/2017 FINAL  Final  MRSA PCR Screening     Status: None   Collection Time: 12/14/17  2:45 AM  Result Value Ref Range Status   MRSA by PCR NEGATIVE NEGATIVE Final    Comment:        The GeneXpert MRSA Assay (FDA approved for NASAL specimens only), is one component of a comprehensive MRSA colonization surveillance program. It is not intended to diagnose MRSA infection nor to guide or monitor treatment for MRSA infections. Performed at [facility], [place] [address], [place],  [address]          Radiology Studies: Dg Knee 3 Views Left  Result Date: 12/15/2017 CLINICAL DATA:  Patient reports knee pain . Says she fell on Friday. Says she twisted when she fell. EXAM: LEFT KNEE - 3 VIEW COMPARISON:  None. FINDINGS: No fracture.  No bone lesion. Knee joint normally spaced and aligned. No significant arthropathic change. Chondrocalcinosis extends along the menisci. Minimal joint effusion. Soft tissues are unremarkable. IMPRESSION: 1. No fracture. 2. Minimal joint effusion.  Electronically Signed   By: Lajean Manes M.D.   On: 12/15/2017 15:36        Scheduled Meds: . colchicine  0.6 mg Oral  BID  . gabapentin  600 mg Oral QHS  . lactase  6,000 Units Oral TID WC  . mirtazapine  30 mg Oral QHS  . mometasone-formoterol  1 puff Inhalation BID  . montelukast  10 mg Oral Daily  . pantoprazole  40 mg Oral Daily  . rivaroxaban  15 mg Oral BID WC   Followed by  . [START ON 01/05/2018] rivaroxaban  20 mg Oral Q supper  . umeclidinium bromide  1 puff Inhalation Daily   Continuous Infusions: . sodium chloride 100 mL/hr at 12/16/17 0556  . sodium phosphate  Dextrose 5% IVPB 30 mmol (12/16/17 0951)     LOS: 3 days    Time spent: 35 minutes.     Hosie Poisson, MD [facility] Pager [phone]   If 7PM-7AM, please contact night-coverage [email or web address] Password [email or web address] 12/16/2017, 1:46 PM

## 2017-12-16 NOTE — Progress Notes (Signed)
IP rehab admissions - I met briefly with patient.  She is very sleepy and unable to sustain a conversation with me this afternoon.  She did say she would like to get her rehab in the hospital.  I will watch for tolerance and participation with therapies before I request acute inpatient rehab admission.  Call me for questions.  [phone]

## 2017-12-16 NOTE — Progress Notes (Signed)
Benefit check for Xarelto in progress; Aneta Mins [phone]

## 2017-12-16 NOTE — Consult Note (Signed)
Physical Medicine and Rehabilitation Consult Reason for Consult: Decreased functional mobility Referring Physician: [facility]   HPI: Ebony Scott is a 67 y.o. female with history of breast cancer, chronic back pain, history of DVT, fibromyalgia, Graves' disease, asthma.  Per chart review and family, patient lives alone.  2 level home with bed and bath on main 14 steps to entry.  Independent prior to admission and driving.  She does have a daughter in the area that works.  Presented 12/13/2017 with altered mental status covered in emesis and feces.  Temperature 100.4, blood pressure 174/76 lactic acid 2.28, troponin negative, total CK 2446, chest x-ray negative, cranial CT scan reviewed, unremarkable for acute intracranial process. Lower extremity Doppler showed DVT right lower extremity peroneal vein and mid calf.  Placed on intravenous heparin has been transitioned to Xarelto.  Placed on IV fluids for acute rhabdomyolysis.  She exhibited no other signs of fluid overload.  CK normalizing 327.  Therapy evaluation completed with recommendations of physical medicine rehab consult.   Review of Systems  Constitutional: Positive for fever and malaise/fatigue. Negative for chills.  HENT: Negative for hearing loss.   Eyes: Negative for blurred vision and double vision.  Respiratory: Negative for cough.        Shortness of breath with heavy exertion  Cardiovascular: Positive for leg swelling. Negative for chest pain and palpitations.  Gastrointestinal: Positive for constipation. Negative for nausea and vomiting.       GERD  Genitourinary: Negative for dysuria and hematuria.  Musculoskeletal: Positive for back pain, joint pain and myalgias.       Chronic neck pain  Skin: Negative for rash.  Neurological: Positive for weakness and headaches.  Psychiatric/Behavioral:       Anxiety  All other systems reviewed and are negative.  Past Medical History:  Diagnosis Date  . Allergic rhinitis   .  Allergy    SEASONAL  . Anemia   . Anxiety    pt denies  . Arthritis    "mostly the hands" (02/20/2012)  . Asthma   . Breast cancer ([place]) 1998   in remission  . Cataract    REMOVED  . Chronic lower back pain   . Chronic neck pain   . Complication of anesthesia    "had hard time waking up from it several times" (02/20/2012)  . Depression    "some; don't take anything for it" (02/20/2012)  . Diverticulosis   . DVT (deep venous thrombosis) ([place])   . Exertional dyspnea   . Fibromyalgia 11/2011  . GERD (gastroesophageal reflux disease)   . Graves disease   . Headache(784.0)    "related to allergies; more at different times during the year" (02/20/2012)  . Hemorrhoids   . Hiatal hernia    back and neck  . Hx of adenomatous colonic polyps 04/12/2016  . Hypercholesteremia    good cholesterol is high  . Hypothyroidism   . IBS (irritable bowel syndrome)   . Moderate persistent asthma    -FeV1 72% 2011, -IgE 102 2011, CT sinus Neg 2011  . Osteoporosis    on reclast yearly  . Pneumonia 04/2011; ~ 11/2011   "double; single" (02/20/2012)   Past Surgical History:  Procedure Laterality Date  . ANTERIOR AND POSTERIOR REPAIR  1990's  . APPENDECTOMY    . BREAST LUMPECTOMY  1998   left  . CARPOMETACARPEL ([place]) FUSION OF THUMB WITH AUTOGRAFT FROM RADIUS  ~ 2009   "both thumbs" (02/20/2012)  .  CATARACT EXTRACTION W/ INTRAOCULAR LENS  IMPLANT, BILATERAL  2012  . CERVICAL DISCECTOMY  10/2001   C5-C6  . CERVICAL FUSION  2003   C3-C4  . CHOLECYSTECTOMY    . COLONOSCOPY    . DEBRIDEMENT TENNIS ELBOW  ?1970's   right  . ESOPHAGOGASTRODUODENOSCOPY    . HYSTERECTOMY    . KNEE ARTHROPLASTY  ?1990's   "?right; w/cartilage repair" (02/20/2012)  . NASAL SEPTUM SURGERY  1980's  . POSTERIOR CERVICAL FUSION/FORAMINOTOMY  2004   "failed initial fusion; rewired  anterior neck" (02/20/2012)  . TONSILLECTOMY  ~ 1953  . VESICOVAGINAL FISTULA CLOSURE W/ TAH  1988  . VIDEO BRONCHOSCOPY Bilateral 08/23/2016    Procedure: VIDEO BRONCHOSCOPY WITH FLUORO;  Surgeon: Javier Glazier, MD;  Location: [facility];  Service: Cardiopulmonary;  Laterality: Bilateral;   Family History  Problem Relation Age of Onset  . Allergies Mother   . Heart disease Mother   . Arthritis Mother   . Lung cancer Mother   . Diabetes Mother   . Allergies Father   . Heart disease Father   . Arthritis Father   . Stroke Father   . Colon cancer Unknown        Maternal half aunt/Maternal half uncle  . Colitis Daughter   . Diabetes Maternal Grandfather    Social History:  reports that she is a non-smoker but has been exposed to tobacco smoke. She has never used smokeless tobacco. She reports that she drinks alcohol. She reports that she does not use drugs. Allergies:  Allergies  Allergen Reactions  . Dust Mite Extract Shortness Of Breath and Other (See Comments)    "sneezing" (02/20/2012)  . Molds & Smuts Shortness Of Breath  . Morphine Sulfate Itching  . Other Shortness Of Breath and Other (See Comments)    Grass and weeds "sneezing; filled sinuses" (02/20/2012)  . Penicillins Rash and Other (See Comments)    "welts" (02/20/2012) Has patient had a PCN reaction causing immediate rash, facial/tongue/throat swelling, SOB or lightheadedness with hypotension: Unknown Has patient had a PCN reaction causing severe rash involving mucus membranes or skin necrosis: No Has patient had a PCN reaction that required hospitalization: No Has patient had a PCN reaction occurring within the last 10 years: No If all of the above answers are "NO", then may proceed with Cephalosporin use.   Marland Kitchen Reclast [Zoledronic Acid] Other (See Comments)    Fever, Put in hospital, dr said it was a reaction from a reaction   . Rofecoxib Swelling    Vioxx REACTION: feet swelling  . Shrimp Flavor Anaphylaxis    ALL SHELLFISH  . Tetracycline Hcl Nausea And Vomiting  . Xolair [Omalizumab] Other (See Comments)    Caused Blood clot  . Dilaudid  [Hydromorphone Hcl] Itching  . Hydrocodone-Acetaminophen Nausea And Vomiting  . Levofloxacin Other (See Comments)    REACTION: GI upset  . Oxycodone Hcl Nausea And Vomiting  . Paroxetine Nausea And Vomiting    Paxil   . Celecoxib Swelling    Feet swelling  . Diltiazem Swelling  . Lactose Intolerance (Gi) Other (See Comments)    Bloating and gas  . Tree Extract Other (See Comments)    "tested and told I was allergic to it; never experienced a reaction to it" (02/20/2012)   Facility-Administered Medications Prior to Admission  Medication Dose Route Frequency Provider Last Rate Last Dose  . denosumab (PROLIA) injection 60 mg  60 mg Subcutaneous Q6 months [name], MD  60 mg at 08/15/17 1155  . [DISCONTINUED] Mepolizumab SOLR 100 mg  100 mg Subcutaneous Q28 days Simonne Maffucci B, MD   100 mg at 05/09/17 0900  . [DISCONTINUED] Mepolizumab SOLR 100 mg  100 mg Subcutaneous Q28 days Simonne Maffucci B, MD   100 mg at 09/12/17 1415  . [DISCONTINUED] Mepolizumab SOLR 100 mg  100 mg Subcutaneous Q28 days Simonne Maffucci B, MD   100 mg at 10/18/17 1224   Medications Prior to Admission  Medication Sig Dispense Refill  . albuterol (PROAIR HFA) 108 (90 Base) MCG/ACT inhaler Inhale 2 puffs into the lungs every 4 (four) hours as needed for wheezing or shortness of breath. 18 g 5  . Alpha-D-Galactosidase (BEANO PO) Take 1 capsule by mouth 3 (three) times daily as needed (gas/ bloating).     . Alpha-Lipoic Acid 600 MG CAPS Take 600 mg by mouth daily.     Marland Kitchen azelastine (ASTELIN) 0.1 % nasal spray USE 2 SPRAYS IN EACH NOSTRIL DAILY AS DIRECTED (Patient taking differently: Place 2 sprays into both nostrils daily. ) 30 mL 5  . CALCIUM-MAGNESIUM PO Take 1 tablet by mouth at bedtime.    . cetirizine (ZYRTEC) 10 MG tablet Take 5 mg by mouth every evening.     . Cholecalciferol (VITAMIN D3) 10000 units TABS Take 1,000 Units by mouth daily.    . Colchicine 0.6 MG CAPS TAKE 1 CAPSULE BY MOUTH DAILY AS  NEEDED (Patient taking differently: Take 0.6 mg by mouth every evening. ) 30 capsule 2  . CREON 24000-76000 units CPEP TAKE 1 CAPSULE BY MOUTH THREE TIMES DAILY (WITH MEALS) (Patient taking differently: Take 1 capsule by mouth 3 (three) times daily with meals. ) 270 each 2  . cyproheptadine (PERIACTIN) 4 MG tablet TAKE 2 TABLETS BY MOUTH EVERY EVENING (Patient taking differently: Take 8 mg by mouth at bedtime. ) 60 tablet 5  . denosumab (PROLIA) 60 MG/ML SOLN injection Inject 60 mg into the skin every 6 (six) months. Administer in upper arm, thigh, or abdomen    . dexlansoprazole (DEXILANT) 60 MG capsule Take 1 capsule (60 mg total) by mouth daily. 30 capsule 5  . diclofenac sodium (VOLTAREN) 1 % GEL Apply 2 g topically 4 (four) times daily. 100 g 3  . EPINEPHrine (EPIPEN 2-PAK) 0.3 mg/0.3 mL IJ SOAJ injection Inject 0.3 mLs (0.3 mg total) into the muscle Once PRN for up to 1 dose. (Patient taking differently: Inject 0.3 mg into the muscle once as needed (severe allergic reaction). ) 1 Device 1  . gabapentin (NEURONTIN) 300 MG capsule Take 300 mg by mouth 2 (two) times daily.    Marland Kitchen gabapentin (NEURONTIN) 600 MG tablet TAKE 1 TABLET BY MOUTH DAILY (Patient taking differently: Take 600 mg by mouth at bedtime. ) 90 tablet 3  . hydrocortisone (ANUSOL-HC) 2.5 % rectal cream APPLY AS NEEDED AS DIRECTED (Patient taking differently: Place 1 application rectally as needed for hemorrhoids. ) 30 g 5  . Lactase (LACTAID FAST ACT) 9000 units TABS Take 18,000 Units by mouth 4 (four) times daily as needed (dairy consumption).     Marland Kitchen lidocaine (LIDODERM) 5 % Place 1 patch onto the skin daily. Remove & Discard patch within 12 hours or as directed by MD (Patient taking differently: Place 1 patch onto the skin daily as needed (pain). Remove & Discard patch within 12 hours or as directed by MD) 90 patch 3  . mirtazapine (REMERON SOL-TAB) 30 MG disintegrating tablet DISOLVE 1 TABLET BY MOUTH  DAILY AT BEDTIME (Patient taking  differently: Take 30 mg by mouth at bedtime. ) 30 tablet 5  . mometasone (NASONEX) 50 MCG/ACT nasal spray Place 2 sprays into the nose 2 (two) times daily. (Patient taking differently: Place 2 sprays into the nose daily. ) 102 g 3  . mometasone-formoterol (DULERA) 200-5 MCG/ACT AERO Inhale 2 puffs into the lungs 2 (two) times daily. (Patient taking differently: Inhale 1 puff into the lungs 2 (two) times daily. ) 1 Inhaler 5  . montelukast (SINGULAIR) 10 MG tablet Take 1 tablet (10 mg total) by mouth daily. 30 tablet 5  . mupirocin ointment (BACTROBAN) 2 % APPLY IN THE NOSE 3 TIMES DAILY AS NEEDED (Patient taking differently: Place 1 application into the nose at bedtime. To prevent nose bleeds) 22 g 2  . nystatin (MYCOSTATIN) 100000 UNIT/ML suspension USE AS DIRECTED 5MLS IN MOUTH OR THROAT 4 TIMES DAILY (Patient taking differently: Use as directed 5 mLs in the mouth or throat at bedtime. Swish and spit) 473 mL 2  . nystatin-triamcinolone ointment (MYCOLOG) Apply 1 application topically 3 (three) times daily as needed (yeast infection).     . Prenatal Vit-Sel-Fe Fum-FA (VINATE M) 27-1 MG TABS Take 1 tablet by mouth daily. 90 each 3  . PRESCRIPTION MEDICATION Place 2 puffs into both ears once a week. Place capsule in dispenser and instill 2 puffs in each ear once a week after washing hair. CS (X) AHCL    CHLORAMP/SULFU/AMPHO B/ H DROC 100-100-5 mg capsule - compounded at [place]    . Probiotic Product (ALIGN) 4 MG CAPS Take 4 mg by mouth every evening.     Marland Kitchen Propylene Glycol-Glycerin (SOOTHE OP) Place 1 drop into both eyes 2 (two) times daily as needed (dry eyes).     . pseudoephedrine-guaifenesin (MUCINEX D) 60-600 MG 12 hr tablet Take 1 tablet by mouth 2 (two) times daily.    . ranitidine (ZANTAC) 300 MG tablet Take 1 tablet (300 mg total) by mouth daily. (Patient taking differently: Take 300 mg by mouth every evening. ) 30 tablet 5  . SIMPLY SALINE NA Place 1 spray into both nostrils at bedtime.     . Tiotropium Bromide Monohydrate (SPIRIVA RESPIMAT) 2.5 MCG/ACT AERS Inhale 2 puffs into the lungs daily. 1 Inhaler 5  . acetaminophen (TYLENOL) 650 MG CR tablet Take 1,300 mg by mouth 2 (two) times daily.     Marland Kitchen atorvastatin (LIPITOR) 40 MG tablet Take 1 tablet (40 mg total) by mouth daily. 90 tablet 3  . cyclobenzaprine (FLEXERIL) 5 MG tablet Take 1 tablet (5 mg total) by mouth 3 (three) times daily as needed for muscle spasms. 30 tablet 1  . methimazole (TAPAZOLE) 5 MG tablet TAKE 1 TABLET BY MOUTH 3 TIMES DAILY (Patient taking differently: Take 5 mg by mouth 2 (two) times daily. ) 270 tablet 1  . Respiratory Therapy Supplies (FLUTTER) DEVI Use as directed 1 each 0  . Spacer/Aero-Holding Chambers (AEROCHAMBER MV) inhaler Use as instructed 1 each 0    Home: Home Living Family/patient expects to be discharged to:: Private residence Living Arrangements: Alone Available Help at Discharge: Family, Available PRN/intermittently Type of Home: House Home Access: Stairs to enter Technical brewer of Steps: 14 Entrance Stairs-Rails: Left Home Layout: Two level, Able to live on main level with bedroom/bathroom Alternate Level Stairs-Number of Steps: flight  Alternate Level Stairs-Rails: Left Bathroom Shower/Tub: Multimedia programmer: Handicapped height Bathroom Accessibility: Yes Home Equipment: Environmental consultant - 2 wheels, Bedside commode, Shower  seat - built in, Grab bars - tub/shower Additional Comments: daughter willing to assist at dc per pt  Functional History: Prior Function Level of Independence: Independent Comments: retired, + driving, independent ADL/IADLs  Functional Status:  Mobility: Bed Mobility Overal bed mobility: Needs Assistance Bed Mobility: Rolling, Supine to Sit, Sit to Supine Rolling: Min assist(roll to left with rail assist) Sidelying to sit: Max assist, +2 for physical assistance Supine to sit: Max assist, HOB elevated Sit to supine: Max assist Sit to  sidelying: Total assist General bed mobility comments: improved from earlier this date (see OT notes), with less severe pain and more localized to lower left leg.  assisted legs toward EOB and instructed on use of rails to roll/scoot to right EOB.  able to lift trunk into sitting from [facility] at 40 degrees.  scooting to right with max assist, use of pads, and instruction for technique, with improveing use of UE to assist but still limited due to LE pain Transfers General transfer comment: deferred as pt indicated MD wants to xray LLE; suspect will do well when premedicated and with RW or other assistive device once LLE cleared Ambulation/Gait General Gait Details: deferred    ADL: ADL Overall ADL's : Needs assistance/impaired Grooming: Set up, Sitting Upper Body Bathing: Minimal assistance, Sitting Lower Body Bathing: Moderate assistance, Bed level Upper Body Dressing : Minimal assistance, Sitting Lower Body Dressing: Total assistance, Bed level, +2 for physical assistance Toilet Transfer Details (indicate cue type and reason): deferred Toileting- Clothing Manipulation and Hygiene: Total assistance, +2 for physical assistance, Bed level Functional mobility during ADLs: Maximal assistance, +2 for physical assistance(bed mobility only) General ADL Comments: pt significantly limited by decreased activity tolerance, pain in BLEs, and generalized weakness  Cognition: Cognition Overall Cognitive Status: Within Functional Limits for tasks assessed Orientation Level: Oriented to person, Oriented to place, Oriented to situation, Oriented to time Cognition Arousal/Alertness: Awake/alert Behavior During Therapy: [facility] for tasks assessed/performed Overall Cognitive Status: Within Functional Limits for tasks assessed General Comments: alert and interactive, has vague memories of events prior to admission,   Blood pressure 132/64, pulse 95, temperature 100.2 F (37.9 C), resp. rate 20, height 5\' 1"   (1.549 m), weight 60.4 kg, SpO2 96 %. Physical Exam  Vitals reviewed. Constitutional: She is oriented to person, place, and time. She appears well-developed and well-nourished. She appears distressed.  HENT:  Head: Normocephalic and atraumatic.  Eyes: EOM are normal. Right eye exhibits no discharge. Left eye exhibits no discharge.  Neck: Normal range of motion. Neck supple. No thyromegaly present.  Cardiovascular: Normal rate and regular rhythm.  Respiratory: Effort normal and breath sounds normal. No respiratory distress.  GI: Soft. Bowel sounds are normal. She exhibits no distension.  Musculoskeletal:  LE edema  Neurological: She is alert and oriented to person, place, and time.  Motor: B/l UE 4-/5 proximal to distal B/ LE: 2/5 proximal to distal (some pain inhibition)  Skin: Skin is warm and dry.  Scattered hematomas  Psychiatric: Her affect is blunt. She is slowed. She exhibits a depressed mood.    Results for orders placed or performed during the hospital encounter of 12/13/17 (from the past 24 hour(s))  CBC     Status: Abnormal   Collection Time: 12/16/17  4:14 AM  Result Value Ref Range   WBC 12.5 (H) 4.0 - 10.5 K/uL   RBC 3.16 (L) 3.87 - 5.11 MIL/uL   Hemoglobin 10.0 (L) 12.0 - 15.0 g/dL   HCT 31.7 (L) 36.0 - 46.0 %  MCV 100.3 (H) 80.0 - 100.0 fL   MCH 31.6 26.0 - 34.0 pg   MCHC 31.5 30.0 - 36.0 g/dL   RDW 14.3 11.5 - 15.5 %   Platelets 215 150 - 400 K/uL   nRBC 0.0 0.0 - 0.2 %  CK     Status: Abnormal   Collection Time: 12/16/17  4:14 AM  Result Value Ref Range   Total CK 327 (H) 38 - 234 U/L  Phosphorus     Status: Abnormal   Collection Time: 12/16/17  4:14 AM  Result Value Ref Range   Phosphorus 1.2 (L) 2.5 - 4.6 mg/dL  Basic metabolic panel     Status: Abnormal   Collection Time: 12/16/17  4:14 AM  Result Value Ref Range   Sodium 137 135 - 145 mmol/L   Potassium 4.8 3.5 - 5.1 mmol/L   Chloride 111 98 - 111 mmol/L   CO2 21 (L) 22 - 32 mmol/L   Glucose,  Bld 91 70 - 99 mg/dL   BUN 17 8 - 23 mg/dL   Creatinine, Ser 1.01 (H) 0.44 - 1.00 mg/dL   Calcium 6.9 (L) 8.9 - 10.3 mg/dL   GFR calc non Af Amer 55 (L) >60 mL/min   GFR calc Af Amer >60 >60 mL/min   Anion gap 5 5 - 15   *Note: Due to a large number of results and/or encounters for the requested time period, some results have not been displayed. A complete set of results can be found in Results Review.   Dg Knee 3 Views Left  Result Date: 12/15/2017 CLINICAL DATA:  Patient reports knee pain . Says she fell on Friday. Says she twisted when she fell. EXAM: LEFT KNEE - 3 VIEW COMPARISON:  None. FINDINGS: No fracture.  No bone lesion. Knee joint normally spaced and aligned. No significant arthropathic change. Chondrocalcinosis extends along the menisci. Minimal joint effusion. Soft tissues are unremarkable. IMPRESSION: 1. No fracture. 2. Minimal joint effusion. Electronically Signed   By: Lajean Manes M.D.   On: 12/15/2017 15:36    Assessment/Plan: Diagnosis: Debility Labs and images (see above) independently reviewed.  Records reviewed and summated above.  1. Does the need for close, 24 hr/day medical supervision in concert with the patient's rehab needs make it unreasonable for this patient to be served in a less intensive setting? Yes  2. Co-Morbidities requiring supervision/potential complications: rhabdomyolysis (cont to monitor, patient, continues to complain of pain), breast cancer, acute on chronic back pain (Biofeedback training with therapies to help reduce reliance on opiate pain medications, monitor pain control during therapies, and sedation at rest and titrate to maximum efficacy to ensure participation and gains in therapies), history of DVT (cont anticoagulation), fibromyalgia, Graves' disease, asthma, leukocytosis (repeat labs, cont to monitor for signs and symptoms of infection, further workup if indicated) 3. Due to safety, skin/wound care, disease management, medication  administration, pain management and patient education, does the patient require 24 hr/day rehab nursing? Yes 4. Does the patient require coordinated care of a physician, rehab nurse, PT (1-2 hrs/day, 5 days/week) and OT (1-2 hrs/day, 5 days/week) to address physical and functional deficits in the context of the above medical diagnosis(es)? Yes Addressing deficits in the following areas: balance, endurance, locomotion, strength, transferring, bathing, dressing, feeding, toileting and psychosocial support 5. Can the patient actively participate in an intensive therapy program of at least 3 hrs of therapy per day at least 5 days per week? Potentially 6. The potential for patient to  make measurable gains while on inpatient rehab is excellent 7. Anticipated functional outcomes upon discharge from inpatient rehab are min assist  with PT, min assist with OT, n/a with SLP. 8. Estimated rehab length of stay to reach the above functional goals is: 20-25 days. 9. Anticipated D/C setting: Other 10. Anticipated post D/C treatments: HH therapy and Home excercise program 11. Overall Rehab/Functional Prognosis: excellent  RECOMMENDATIONS: This patient's condition is appropriate for continued rehabilitative care in the following setting: CIR once able to tolerate 3 hours of therapy/day if caregiver support available as patient will require assistance. Patient has agreed to participate in recommended program. Potentially Note that insurance prior authorization may be required for reimbursement for recommended care.  Comment: Rehab Admissions Coordinator to follow up.   I have personally performed a face to face diagnostic evaluation, including, but not limited to relevant history and physical exam findings, of this patient and developed relevant assessment and plan.  Additionally, I have reviewed and concur with the physician assistant's documentation above.   Delice Lesch, MD, ABPMR Lavon Paganini Angiulli,  PA-C 12/16/2017

## 2017-12-17 ENCOUNTER — Inpatient Hospital Stay (HOSPITAL_COMMUNITY): Payer: Medicare Other

## 2017-12-17 DIAGNOSIS — M25572 Pain in left ankle and joints of left foot: Secondary | ICD-10-CM

## 2017-12-17 DIAGNOSIS — E440 Moderate protein-calorie malnutrition: Secondary | ICD-10-CM

## 2017-12-17 DIAGNOSIS — A419 Sepsis, unspecified organism: Secondary | ICD-10-CM

## 2017-12-17 LAB — CBC
HCT: 32.1 % — ABNORMAL LOW (ref 36.0–46.0)
Hemoglobin: 10.4 g/dL — ABNORMAL LOW (ref 12.0–15.0)
MCH: 32.2 pg (ref 26.0–34.0)
MCHC: 32.4 g/dL (ref 30.0–36.0)
MCV: 99.4 fL (ref 80.0–100.0)
Platelets: 263 10*3/uL (ref 150–400)
RBC: 3.23 MIL/uL — ABNORMAL LOW (ref 3.87–5.11)
RDW: 14.8 % (ref 11.5–15.5)
WBC: 17.3 10*3/uL — ABNORMAL HIGH (ref 4.0–10.5)
nRBC: 0.1 % (ref 0.0–0.2)

## 2017-12-17 LAB — HEMOGLOBIN AND HEMATOCRIT, BLOOD
HCT: 32.1 % — ABNORMAL LOW (ref 36.0–46.0)
Hemoglobin: 10.3 g/dL — ABNORMAL LOW (ref 12.0–15.0)

## 2017-12-17 LAB — BASIC METABOLIC PANEL
Anion gap: 8 (ref 5–15)
BUN: 13 mg/dL (ref 8–23)
CO2: 20 mmol/L — ABNORMAL LOW (ref 22–32)
Calcium: 6.5 mg/dL — ABNORMAL LOW (ref 8.9–10.3)
Chloride: 111 mmol/L (ref 98–111)
Creatinine, Ser: 1 mg/dL (ref 0.44–1.00)
GFR calc Af Amer: >60 mL/min (ref >60)
GFR calc non Af Amer: 56 mL/min — ABNORMAL LOW (ref >60)
Glucose, Bld: 96 mg/dL (ref 70–99)
Potassium: 4 mmol/L (ref 3.5–5.1)
Sodium: 139 mmol/L (ref 135–145)

## 2017-12-17 LAB — SYNOVIAL CELL COUNT + DIFF, W/ CRYSTALS
Eosinophils-Synovial: 0 % (ref 0–1)
Lymphocytes-Synovial Fld: 1 % (ref 0–20)
Monocyte-Macrophage-Synovial Fluid: 6 % — ABNORMAL LOW (ref 50–90)
Neutrophil, Synovial: 93 % — ABNORMAL HIGH (ref 0–25)
WBC, Synovial: 11500 /mm3 — ABNORMAL HIGH (ref 0–200)

## 2017-12-17 LAB — LACTIC ACID, PLASMA: Lactic Acid, Venous: 1 mmol/L (ref 0.5–1.9)

## 2017-12-17 LAB — MAGNESIUM: Magnesium: 1.7 mg/dL (ref 1.7–2.4)

## 2017-12-17 LAB — CK: Total CK: 162 U/L (ref 38–234)

## 2017-12-17 LAB — PROCALCITONIN: Procalcitonin: 6.03 ng/mL

## 2017-12-17 LAB — PHOSPHORUS: Phosphorus: 2.3 mg/dL — ABNORMAL LOW (ref 2.5–4.6)

## 2017-12-17 MED ORDER — TRAMADOL HCL 50 MG PO TABS
50.0000 mg | ORAL_TABLET | Freq: Four times a day (QID) | ORAL | Status: DC | PRN
  Administered 2017-12-23: 50 mg via ORAL
  Filled 2017-12-17 (×2): qty 1

## 2017-12-17 MED ORDER — CLINDAMYCIN PHOSPHATE 600 MG/50ML IV SOLN
600.0000 mg | Freq: Three times a day (TID) | INTRAVENOUS | Status: DC
  Administered 2017-12-17 – 2017-12-19 (×7): 600 mg via INTRAVENOUS
  Filled 2017-12-17 (×8): qty 50

## 2017-12-17 MED ORDER — FERROUS SULFATE 325 (65 FE) MG PO TABS
325.0000 mg | ORAL_TABLET | Freq: Every day | ORAL | Status: DC
  Administered 2017-12-18 – 2017-12-24 (×7): 325 mg via ORAL
  Filled 2017-12-17 (×8): qty 1

## 2017-12-17 MED ORDER — CEFAZOLIN SODIUM-DEXTROSE 1-4 GM/50ML-% IV SOLN: 1.0000 g | Freq: Three times a day (TID) | INTRAVENOUS | Status: DC

## 2017-12-17 MED ORDER — ENSURE ENLIVE PO LIQD
237.0000 mL | Freq: Three times a day (TID) | ORAL | Status: DC
  Administered 2017-12-17 – 2017-12-21 (×8): 237 mL via ORAL

## 2017-12-17 MED ORDER — SODIUM PHOSPHATES 45 MMOLE/15ML IV SOLN
30.0000 mmol | Freq: Once | INTRAVENOUS | Status: AC
  Administered 2017-12-17: 30 mmol via INTRAVENOUS
  Filled 2017-12-17: qty 10

## 2017-12-17 NOTE — Progress Notes (Addendum)
PROGRESS NOTE    Ebony Scott  LHT:[id number] DOB: 07/02/1950 DOA: 12/13/2017 PCP: [name], MD    Brief Narrative:  67 y.o.femalewith a hx of anxiety, depression, asthma, breast cancer, chronic back pain, chronic neck pain, diverticulosis, DVT, fibromyalgia, GERD, Graves' disease, hyperlipidemia, IBS, and osteoporosis who was found down by her daughter confused and soiled w/ vomit and feces. She does not remember what happened or having any symptoms prior to losing consciousness. In the ED her vitals were unremarkable and a CXR did not show any significant abnormality. CT head did not show any acute intracranial pathology. Bilateral lower extremities duplex showed RLE DVT.  CT angiogram of the chest shows bilateral small pulmonary embolism.    Assessment & Plan:   Principal Problem: Acute bilateral PE Active Problems:   Hyperthyroidism   Hyperlipidemia   GERD   Pseudogout   Chronic diastolic CHF (congestive heart failure) (HCC)   Rhabdomyolysis   Acute deep vein thrombosis (DVT) of right lower extremity (HCC)   Acute bilateral PE and acute right lower extremity DVT She was started on IV heparin and transition to oral Xarelto.    SIRS, sepsis ruled out  probably from rhabdomyolysis and acute pulmonary embolism.   Acute rhabdomyolysis; Improving CK, .  Renal function and urine output continues to improve with IV fluids, Pain control with oral tramadol and tylenol,.    Chronic diastolic heart failure  CXR shows small bilateral pleural effusions, with atelectasis, . She was slightly hypoxic last night requiring oxygen overnight. I would stop the fluids and monitor her and see if we can wean her off the oxygen in the next 24 hours.    Hyperlipidemia holding statin due to acute rhabdomyolysis at this time.   History of hyper parathyroidism TSH is around 5,  patient's PCP recommended holding methimazole for 1 month from October 17.   GERD Continue with  Protonix.   Severe left ankle pain and swelling,  Differential include, fracture vs gout vs pseudo gout flare up.  She will need x rays of the left ankle , done showed soft tissue swelling and chondrocalcinosis.  She was empirically started on colchicine, if her pain does not improve in the next 12 hours , she might need IV steroids.     Severe Hypophosphatemia:Moderate malnutrition :  Repleted, repeat level shows slight improvement but still low, replete with IV phos again today and monitor.  Nutrition will be consulted, will need to watch for refeeding syndrome.   Hypoalbuminemia:  Nutrition consulted for protein supplementation   Moderate to severe generalized weakness associated with lethargy  probably from laying on the floor from wednesday till Friday, continuous rhabdomyolysis and severe hypophosphatemia.  In view of the ongoing electrolyte deficiencies, requiring IV replacements, pt unable to bear any weight on the left ankle and unable to participate in therapy, due to lethargy, she will require in patient status.    Hypocalcemia:  Will need replacements    Elevated liver function panel: Pending panel today.    Iron deficiency anemia:  Iron supplementation will be added.    Leukocytosis/ low grade fever:  Suspect from gout, with superimposed cellulitis. . X rays show soft tissue swelling, will also add IV clindamycin for cellulitis .  CBC in am.    DVT prophylaxis: Heparin transitioned to Xarelto Code Status: Full code Family Communication: discussed with daughter over the phone.  disposition Plan: Pending clinical improvement  Consultants:   None   Procedures: None   Antimicrobials: None  Subjective: Discussed the plan with the patient and the daughter over the phone.  Pt reports severe pain in the left ankle, not able to bear weight on the left ankle.  Continues to have pain , still lethargic,but better than yesterday.  Needs to be oob to chair.  As per RN, she was hypoxic overnight requiring about 1 lit of [place] oxygen.  Incentive spirometry ordered for atelectasis.   Objective: Vitals:   12/16/17 1924 12/16/17 2155 12/17/17 0522 12/17/17 0742  BP:  (!) 160/64 (!) 145/76   Pulse:  (!) 112 (!) 101 97  Resp:  (!) 32 20 18  Temp:  98.1 F (36.7 C) 97.9 F (36.6 C)   TempSrc:  Oral Oral   SpO2: 91% 93% 95% 92%  Weight:      Height:        Intake/Output Summary (Last 24 hours) at 12/17/2017 1349 Last data filed at 12/17/2017 1012 Gross per 24 hour  Intake 980 ml  Output 1150 ml  Net -170 ml   Filed Weights   12/13/17 1026 12/14/17 0207  Weight: 61.2 kg 60.4 kg    Examination:  General exam: not in distress,but sleepy, and reports pain in the ankle has not resolved.  Respiratory system: diminished at bases, no wheezing though.  Cardiovascular system: S1 & S2 heard, RRR. No JVD  Gastrointestinal system: Abdomen is soft NT ND BS+ Central nervous system: lethargic, briefly opening eyes to answer questions, able to move all extremities.  Extremities: decreased strength in bilateral lower extremities. Left ankle swollen and tender with erythema . Marland Kitchen  Skin: No rashes, lesions or ulcers Psychiatry: sleepy, mood appropriate.     Data Reviewed: I have personally reviewed following labs and imaging studies  CBC: Recent Labs  Lab 12/13/17 1051 12/13/17 1058 12/14/17 0434 12/15/17 0342 12/16/17 0414 12/17/17 0530  WBC 13.4*  --  12.8* 10.4 12.5* 17.3*  NEUTROABS 10.2*  --  10.1*  --   --   --   HGB 14.5 15.6* 12.5 10.7* 10.0* 10.4*  HCT 44.3 46.0 39.3 32.3* 31.7* 32.1*  MCV 99.3  --  100.0 98.2 100.3* 99.4  PLT 201  --  203 201 215 458   Basic Metabolic Panel: Recent Labs  Lab 12/13/17 1051 12/13/17 1058 12/13/17 1849 12/14/17 0434 12/15/17 0342 12/16/17 0414 12/17/17 0530  NA 139 138  --  141 138 137 139  K 3.7 4.0  --  3.7 3.3* 4.8 4.0  CL 101 103  --  105 109 111 111  CO2 22  --   --  21* 21* 21* 20*    GLUCOSE 126* 126*  --  103* 98 91 96  BUN 29* 44*  --  24* 20 17 13   CREATININE 1.47* 1.40*  --  1.19* 1.07* 1.01* 1.00  CALCIUM 8.5*  --   --  8.0* 7.1* 6.9* 6.5*  MG  --   --  2.3  --   --   --  1.7  PHOS  --   --  2.1*  --   --  1.2* 2.3*   GFR: Estimated Creatinine Clearance: 44.3 mL/min (by C-G formula based on SCr of 1 mg/dL). Liver Function Tests: Recent Labs  Lab 12/13/17 1051 12/15/17 0342  AST 87* 85*  ALT 55* 61*  ALKPHOS 72 58  BILITOT 2.3* 0.9  PROT 6.7 5.2*  ALBUMIN 2.8* 2.0*   No results for input(s): LIPASE, AMYLASE in the last 168 hours. No results for input(s):  AMMONIA in the last 168 hours. Coagulation Profile: No results for input(s): INR, PROTIME in the last 168 hours. Cardiac Enzymes: Recent Labs  Lab 12/13/17 1051 12/14/17 0434 12/15/17 0342 12/16/17 0414 12/17/17 0530  CKTOTAL 2,446* 1,465* 559* 327* 162   BNP (last 3 results) No results for input(s): PROBNP in the last 8760 hours. HbA1C: No results for input(s): HGBA1C in the last 72 hours. CBG: No results for input(s): GLUCAP in the last 168 hours. Lipid Profile: No results for input(s): CHOL, HDL, LDLCALC, TRIG, CHOLHDL, LDLDIRECT in the last 72 hours. Thyroid Function Tests: No results for input(s): TSH, T4TOTAL, FREET4, T3FREE, THYROIDAB in the last 72 hours. Anemia Panel: Recent Labs    12/16/17 0937 12/16/17 0953  VITAMINB12  --  3,282*  FOLATE  --  23.3  FERRITIN  --  712*  TIBC  --  140*  IRON  --  14*  RETICCTPCT 0.9  --    Sepsis Labs: Recent Labs  Lab 12/13/17 1059 12/13/17 1629  LATICACIDVEN 2.28* 1.52    Recent Results (from the past 240 hour(s))  Blood Culture (routine x 2)     Status: None (Preliminary result)   Collection Time: 12/13/17 11:38 AM  Result Value Ref Range Status   Specimen Description BLOOD RIGHT ANTECUBITAL  Final   Special Requests   Final    BOTTLES DRAWN AEROBIC AND ANAEROBIC Blood Culture results may not be optimal due to an excessive  volume of blood received in culture bottles   Culture   Final    NO GROWTH 4 DAYS Performed at [facility], [place] [address], [place], [place] [address]    Report Status PENDING  Incomplete  Blood Culture (routine x 2)     Status: None (Preliminary result)   Collection Time: 12/13/17 11:53 AM  Result Value Ref Range Status   Specimen Description BLOOD BLOOD RIGHT HAND  Final   Special Requests   Final    BOTTLES DRAWN AEROBIC AND ANAEROBIC Blood Culture adequate volume   Culture   Final    NO GROWTH 4 DAYS Performed at [facility], [place] [address], [place], [place] [address]    Report Status PENDING  Incomplete  Urine culture     Status: None   Collection Time: 12/13/17  2:41 PM  Result Value Ref Range Status   Specimen Description URINE, RANDOM  Final   Special Requests NONE  Final   Culture   Final    NO GROWTH Performed at [facility], [address], [place], [place] [address]    Report Status 12/14/2017 FINAL  Final  MRSA PCR Screening     Status: None   Collection Time: 12/14/17  2:45 AM  Result Value Ref Range Status   MRSA by PCR NEGATIVE NEGATIVE Final    Comment:        The GeneXpert MRSA Assay (FDA approved for NASAL specimens only), is one component of a comprehensive MRSA colonization surveillance program. It is not intended to diagnose MRSA infection nor to guide or monitor treatment for MRSA infections. Performed at [facility], [place] [address] [place], [place] [address]          Radiology Studies: Dg Chest 2 View  Result Date: 12/17/2017 CLINICAL DATA:  Hypoxia. EXAM: CHEST - 2 VIEW COMPARISON:  Radiograph December 13, 2017. FINDINGS: The heart size and mediastinal contours are within normal limits. Minimal bibasilar subsegmental atelectasis is noted with small pleural effusions.  No pneumothorax is noted. The visualized skeletal structures are unremarkable. IMPRESSION: Minimal bibasilar subsegmental atelectasis with small  pleural effusions. Electronically Signed   By: Marijo Conception, M.D.   On: 12/17/2017 11:25   Dg Ankle Complete Left  Result Date: 12/16/2017 CLINICAL DATA:  Pain EXAM: LEFT ANKLE COMPLETE - 3+ VIEW COMPARISON:  None. FINDINGS: Frontal, oblique, and lateral views obtained. There is generalized soft tissue swelling. There is no acute fracture or joint effusion. There is mild generalized joint space narrowing. There is calcification adjacent to the distal tibial metaphysis medially which may represent residua of old trauma. There is mild chondrocalcinosis within the ankle joint medially. Ankle mortise appears intact. No erosive changes. IMPRESSION: Areas of osteoarthritic change noted. There is soft tissue swelling. Mild chondrocalcinosis is noted, a finding that may be seen with osteoarthritis or with calcium pyrophosphate deposition disease. No evident fracture.  Ankle mortise appears intact. Electronically Signed   By: Lowella Grip III M.D.   On: 12/16/2017 14:43   Dg Knee 3 Views Left  Result Date: 12/15/2017 CLINICAL DATA:  Patient reports knee pain . Says she fell on Friday. Says she twisted when she fell. EXAM: LEFT KNEE - 3 VIEW COMPARISON:  None. FINDINGS: No fracture.  No bone lesion. Knee joint normally spaced and aligned. No significant arthropathic change. Chondrocalcinosis extends along the menisci. Minimal joint effusion. Soft tissues are unremarkable. IMPRESSION: 1. No fracture. 2. Minimal joint effusion. Electronically Signed   By: Lajean Manes M.D.   On: 12/15/2017 15:36        Scheduled Meds: . colchicine  0.6 mg Oral BID  . feeding supplement (ENSURE ENLIVE)  237 mL Oral TID WC  . [START ON 12/18/2017] ferrous sulfate  325 mg Oral Q breakfast  . gabapentin  600 mg Oral QHS  . lactase  6,000 Units Oral TID WC  . mirtazapine  30 mg Oral QHS  . mometasone-formoterol  1 puff Inhalation BID  . montelukast  10 mg Oral Daily  . pantoprazole  40 mg Oral Daily  . rivaroxaban   15 mg Oral BID WC   Followed by  . [START ON 01/05/2018] rivaroxaban  20 mg Oral Q supper  . umeclidinium bromide  1 puff Inhalation Daily   Continuous Infusions: . clindamycin (CLEOCIN) IV 600 mg (12/17/17 1023)     LOS: 4 days    Time spent: 36 minutes.     Hosie Poisson, MD [facility] Pager [phone]   If 7PM-7AM, please contact night-coverage [email or web address] Password [facility] 12/17/2017, 1:49 PM    RN reported that she spiked fevers, .  Ordered Blood cultures, pro calcitonin and lactic acid.  Requested orthopedics for evaluation of septic arthritis of the left ankle.    Hosie Poisson MD [phone]

## 2017-12-17 NOTE — Procedures (Signed)
Procedure: Left ankle aspiration  Indication: Left ankle effusion(s)  Surgeon: Silvestre Gunner, PA-C  Assist: None  Anesthesia: None  EBL: None  Complications: None  Findings: After risks/benefits explained patient desires to undergo procedure. Consent obtained and time out performed. The left ankle was sterilely prepped and aspirated. 41ml cloudy yellow fluid obtained. Pt tolerated the procedure well.    Lisette Abu, PA-C Orthopedic Surgery [phone]

## 2017-12-17 NOTE — Progress Notes (Signed)
Initial Nutrition Assessment  DOCUMENTATION CODES:   Non-severe (moderate) malnutrition in context of chronic illness  INTERVENTION:   - Ensure Enlive po TID with meals, each supplement provides 350 kcal and 20 grams of protein  - Recommending feeding assistance with meals  Monitor magnesium, potassium, and phosphorus daily for at least 3 days, MD to replete as needed, as pt is at risk for refeeding syndrome given prolonged time without PO intake and protein-calorie malnutrition.  NUTRITION DIAGNOSIS:   Moderate Malnutrition related to chronic illness (Graves diease, IBS) as evidenced by moderate fat depletion, moderate muscle depletion.  GOAL:   Patient will meet greater than or equal to 90% of their needs  MONITOR:   PO intake, Supplement acceptance, Weight trends, I & O's, Labs  REASON FOR ASSESSMENT:   Consult Assessment of nutrition requirement/status  ASSESSMENT:   67 year old female who presented to the ED on 10/25 with AMS after a fall. PMH significant for anemia, Graves disease, breast cancer now in remission, DVT, GERD, hyperlipidemia, osteoporosis, and IBS. Pt found to have acute bilateral PE and acute right lower extremity DVT.  Noted therapies recommending CIR at this time.  Per MD note yesterday, will need to watch for refeeding syndrome.  Spoke with pt at bedside. Pt reports that she has not been eating much at meals due to pain medications making her doze off and miss meals. Pt reports that her daughter was upset with her yesterday as she did not finish one of her meal trays.  Pt reports that for breakfast, she consumed a few bites of eggs, orange juice, and a few bites of potatoes. Pt states that she has gout in both hands and both feet and therefore experiences pain when trying to feed herself. Recommend feeding assistance with meals.  Pt states that prior to admission, her appetite and eating habits were "normal." Pt reports typically eating 3 meals daily  and requiring no assistance with meal preparation or eating.  Breakfast: cappuccino with small donut or something sweet Lunch: sandwich Dinner: meat and vegetable  Pt shares that her weight has been higher than usual. Pt reports her UBW as 110 lbs. Per weight history in chart, pt's weight has been stable between 133-141 lbs over the past 10 months.  Meal Completion: 0-75% x last 5 meals  Medications reviewed and include: ferrous sulfate 325 mg daily, Lactaid 6000 units TID with meals, Remeron 30 mg daily, Protonix 40 mg , IV antibiotics  Labs reviewed: phosphorus 2.3 (L), vitamin B-12 3282 (H)  UOP: 1550 ml x 24 hours I/O's: +3.3 L since admit  NUTRITION - FOCUSED PHYSICAL EXAM:    Most Recent Value  Orbital Region  Moderate depletion  Upper Arm Region  Moderate depletion  Thoracic and Lumbar Region  Moderate depletion  Buccal Region  Moderate depletion  Temple Region  Mild depletion  Clavicle Bone Region  Moderate depletion  Clavicle and Acromion Bone Region  Moderate depletion  Scapular Bone Region  Unable to assess  Dorsal Hand  Moderate depletion  Patellar Region  Mild depletion  Anterior Thigh Region  Mild depletion  Posterior Calf Region  Mild depletion  Edema (RD Assessment)  Mild [BLE]  Hair  Reviewed  Eyes  Reviewed  Mouth  Reviewed  Skin  Reviewed  Nails  Reviewed       Diet Order:   Diet Order            Diet regular Room service appropriate? Yes; Fluid consistency: Thin  Diet effective  now              EDUCATION NEEDS:   No education needs have been identified at this time  Skin:  Skin Assessment: Reviewed RN Assessment  Last BM:  10/27 (medium type 5 )  Height:   Ht Readings from Last 1 Encounters:  12/14/17 5\' 1"  (1.549 m)    Weight:   Wt Readings from Last 1 Encounters:  12/14/17 60.4 kg    Ideal Body Weight:  47.73 kg  BMI:  Body mass index is 25.16 kg/m.  Estimated Nutritional Needs:   Kcal:  1550-1750  Protein:  75-90  grams  Fluid:  1.5-1.7 L    Gaynell Face, MS, RD, LDN Inpatient Clinical Dietitian Pager: [phone] Weekend/After Hours: [phone]

## 2017-12-17 NOTE — Progress Notes (Signed)
Physical Therapy Treatment Patient Details Name: Ebony Scott MRN: [id number] DOB: Aug 14, 1950 Today's Date: 12/17/2017    History of Present Illness PT is a 67 y.o. female with a hx of anxiety, depression, asthma, breast cancer, chronic back pain, chronic neck pain, diverticulosis, DVT, fibromyalgia, GERD, Graves' disease, hyperlipidemia, IBS, and osteoporosis who was found down by her daughter confused and soiled w/ vomit and feces. Last know to be well time on Wed. CT negative for intracranial pathology.  Found with R LE DVT and small B PE, SIRS and low grade rhabdomyolysis per internal medicine.     PT Comments    Pt was seen after aspiration and was expecting to feel much better.  However her L ankle was quite sensitive and did not get her up to side of bed as was attempted.  Her family worked with PT to scoot up in bed quickly which went fairly well.  Pt then was instructed in exercises to work on in bed in between therapy sessions.  Follow up acutely to progress with mobility including an AP transfer to the chair as tolerated, and will see how her pain can be managed to increase her stability with transfers.     Follow Up Recommendations  CIR     Equipment Recommendations       Recommendations for Other Services Rehab consult     Precautions / Restrictions Precautions Precautions: Fall Precaution Comments: bed alarm Restrictions Weight Bearing Restrictions: No    Mobility  Bed Mobility Overal bed mobility: Needs Assistance Bed Mobility: Supine to Sit   Sidelying to sit: Total assist Supine to sit: Total assist     General bed mobility comments: pt was not able to tolerate full transition to sit, unable to move LLE but had aspriation of joint this afternoon  Transfers                    Ambulation/Gait                 Stairs             Wheelchair Mobility    Modified Rankin (Stroke Patients Only)       Balance                                            Cognition Arousal/Alertness: Awake/alert Behavior During Therapy: WFL for tasks assessed/performed Overall Cognitive Status: Within Functional Limits for tasks assessed                                 General Comments: talked about her inability to remember what happened with her fall and being out      Exercises General Exercises - Lower Extremity Ankle Circles/Pumps: AROM;AAROM;Both;5 reps Quad Sets: AROM;Both;10 reps Gluteal Sets: AROM;Both;10 reps Hip ABduction/ADduction: AROM;AAROM;Both;10 reps    General Comments General comments (skin integrity, edema, etc.): had family present during the session      Pertinent Vitals/Pain Pain Assessment: 0-10 Pain Score: 7  Pain Location: BLEs, L>R Pain Descriptors / Indicators: Grimacing;Sore Pain Intervention(s): Limited activity within patient's tolerance;Monitored during session;Premedicated before session;Repositioned    Home Living                      Prior Function  PT Goals (current goals can now be found in the care plan section) Progress towards PT goals: Not progressing toward goals - comment    Frequency    Min 4X/week      PT Plan Current plan remains appropriate    Co-evaluation              AM-PAC PT "6 Clicks" Daily Activity  Outcome Measure  Difficulty turning over in bed (including adjusting bedclothes, sheets and blankets)?: Unable Difficulty moving from lying on back to sitting on the side of the bed? : Unable Difficulty sitting down on and standing up from a chair with arms (e.g., wheelchair, bedside commode, etc,.)?: Unable Help needed moving to and from a bed to chair (including a wheelchair)?: Total Help needed walking in hospital room?: Total Help needed climbing 3-5 steps with a railing? : Total 6 Click Score: 6    End of Session   Activity Tolerance: Patient limited by pain Patient left: in bed;with call  bell/phone within reach;with bed alarm set;with family/visitor present Nurse Communication: Mobility status PT Visit Diagnosis: Unsteadiness on feet (R26.81);Muscle weakness (generalized) (M62.81);Difficulty in walking, not elsewhere classified (R26.2);History of falling (Z91.81) Pain - Right/Left: Left Pain - part of body: Ankle and joints of foot     Time: 0569-7948 PT Time Calculation (min) (ACUTE ONLY): 25 min  Charges:  $Therapeutic Exercise: 8-22 mins $Therapeutic Activity: 8-22 mins                     Ramond Dial 12/17/2017, 5:47 PM   Mee Hives, PT MS Acute Rehab Dept. Number: [place] [facility] [place]

## 2017-12-17 NOTE — Consult Note (Signed)
Reason for Consult:Left ankle pain Referring Physician: V [name] is an 67 y.o. female.  HPI: Ebony Scott was admitted to the hospital 10/25 after she was found down by her daughter. She apparently had been down for up to 3d and was confused. She was diagnosed with SIRS and acute PE's. She notes that she has had left ankle pain since she regained awareness. X-rays showed nothing acute. Today her fever spike to 103 and her WBC rose to 17.3. Septic joint could not be ruled out and orthopedic surgery was consulted. 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  Past Medical History:  Diagnosis Date  . Allergic rhinitis   . Allergy    SEASONAL  . Anemia   . Anxiety    pt denies  . Arthritis    "mostly the hands" (02/20/2012)  . Asthma   . Breast cancer ([place]) 1998   in remission  . Cataract    REMOVED  . Chronic lower back pain   . Chronic neck pain   . Complication of anesthesia    "had hard time waking up from it several times" (02/20/2012)  . Depression    "some; don't take anything for it" (02/20/2012)  . Diverticulosis   . DVT (deep venous thrombosis) ([place])   . Exertional dyspnea   . Fibromyalgia 11/2011  . GERD (gastroesophageal reflux disease)   . Graves disease   . Headache(784.0)    "related to allergies; more at different times during the year" (02/20/2012)  . Hemorrhoids   . Hiatal hernia    back and neck  . Hx of adenomatous colonic polyps 04/12/2016  . Hypercholesteremia    good cholesterol is high  . Hypothyroidism   . IBS (irritable bowel syndrome)   . Moderate persistent asthma    -FeV1 72% 2011, -IgE 102 2011, CT sinus Neg 2011  . Osteoporosis    on reclast yearly  . Pneumonia 04/2011; ~ 11/2011   "double; single" (02/20/2012)    Past Surgical History:  Procedure Laterality Date  . ANTERIOR AND POSTERIOR REPAIR  1990's  . APPENDECTOMY    . BREAST LUMPECTOMY  1998   left  . CARPOMETACARPEL ([place]) FUSION OF THUMB WITH AUTOGRAFT  FROM RADIUS  ~ 2009   "both thumbs" (02/20/2012)  . CATARACT EXTRACTION W/ INTRAOCULAR LENS  IMPLANT, BILATERAL  2012  . CERVICAL DISCECTOMY  10/2001   C5-C6  . CERVICAL FUSION  2003   C3-C4  . CHOLECYSTECTOMY    . COLONOSCOPY    . DEBRIDEMENT TENNIS ELBOW  ?1970's   right  . ESOPHAGOGASTRODUODENOSCOPY    . HYSTERECTOMY    . KNEE ARTHROPLASTY  ?1990's   "?right; w/cartilage repair" (02/20/2012)  . NASAL SEPTUM SURGERY  1980's  . POSTERIOR CERVICAL FUSION/FORAMINOTOMY  2004   "failed initial fusion; rewired  anterior neck" (02/20/2012)  . TONSILLECTOMY  ~ 1953  . VESICOVAGINAL FISTULA CLOSURE W/ TAH  1988  . VIDEO BRONCHOSCOPY Bilateral 08/23/2016   Procedure: VIDEO BRONCHOSCOPY WITH FLUORO;  Surgeon: Javier Glazier, MD;  Location: [facility];  Service: Cardiopulmonary;  Laterality: Bilateral;    Family History  Problem Relation Age of Onset  . Allergies Mother   . Heart disease Mother   . Arthritis Mother   . Lung cancer Mother   . Diabetes Mother   . Allergies Father   . Heart disease Father   . Arthritis Father   . Stroke Father   . Colon cancer  Unknown        Maternal half aunt/Maternal half uncle  . Colitis Daughter   . Diabetes Maternal Grandfather     Social History:  reports that she is a non-smoker but has been exposed to tobacco smoke. She has never used smokeless tobacco. She reports that she drinks alcohol. She reports that she does not use drugs.  Allergies:  Allergies  Allergen Reactions  . Dust Mite Extract Shortness Of Breath and Other (See Comments)    "sneezing" (02/20/2012)  . Molds & Smuts Shortness Of Breath  . Morphine Sulfate Itching  . Other Shortness Of Breath and Other (See Comments)    Grass and weeds "sneezing; filled sinuses" (02/20/2012)  . Penicillins Rash and Other (See Comments)    "welts" (02/20/2012) Has patient had a PCN reaction causing immediate rash, facial/tongue/throat swelling, SOB or lightheadedness with hypotension:  Unknown Has patient had a PCN reaction causing severe rash involving mucus membranes or skin necrosis: No Has patient had a PCN reaction that required hospitalization: No Has patient had a PCN reaction occurring within the last 10 years: No If all of the above answers are "NO", then may proceed with Cephalosporin use.   Marland Kitchen Reclast [Zoledronic Acid] Other (See Comments)    Fever, Put in hospital, dr said it was a reaction from a reaction   . Rofecoxib Swelling    Vioxx REACTION: feet swelling  . Shrimp Flavor Anaphylaxis    ALL SHELLFISH  . Tetracycline Hcl Nausea And Vomiting  . Xolair [Omalizumab] Other (See Comments)    Caused Blood clot  . Dilaudid [Hydromorphone Hcl] Itching  . Hydrocodone-Acetaminophen Nausea And Vomiting  . Levofloxacin Other (See Comments)    REACTION: GI upset  . Oxycodone Hcl Nausea And Vomiting  . Paroxetine Nausea And Vomiting    Paxil   . Celecoxib Swelling    Feet swelling  . Diltiazem Swelling  . Lactose Intolerance (Gi) Other (See Comments)    Bloating and gas  . Tree Extract Other (See Comments)    "tested and told I was allergic to it; never experienced a reaction to it" (02/20/2012)    Medications: I have reviewed the patient's current medications.  Results for orders placed or performed during the hospital encounter of 12/13/17 (from the past 48 hour(s))  CBC     Status: Abnormal   Collection Time: 12/16/17  4:14 AM  Result Value Ref Range   WBC 12.5 (H) 4.0 - 10.5 K/uL   RBC 3.16 (L) 3.87 - 5.11 MIL/uL   Hemoglobin 10.0 (L) 12.0 - 15.0 g/dL   HCT 31.7 (L) 36.0 - 46.0 %   MCV 100.3 (H) 80.0 - 100.0 fL   MCH 31.6 26.0 - 34.0 pg   MCHC 31.5 30.0 - 36.0 g/dL   RDW 14.3 11.5 - 15.5 %   Platelets 215 150 - 400 K/uL   nRBC 0.0 0.0 - 0.2 %    Comment: Performed at [facility], [place] [address] [place], [place] [address]  CK     Status: Abnormal   Collection Time: 12/16/17  4:14 AM  Result Value Ref Range   Total CK 327 (H) 38 - 234  U/L    Comment: Performed at [facility], [place] [address], [place], [place] [address]  Phosphorus     Status: Abnormal   Collection Time: 12/16/17  4:14 AM  Result Value Ref Range   Phosphorus 1.2 (L) 2.5 - 4.6 mg/dL    Comment:  Performed at [facility], [place] [address] [place], [place] [address]  Basic metabolic panel     Status: Abnormal   Collection Time: 12/16/17  4:14 AM  Result Value Ref Range   Sodium 137 135 - 145 mmol/L   Potassium 4.8 3.5 - 5.1 mmol/L   Chloride 111 98 - 111 mmol/L   CO2 21 (L) 22 - 32 mmol/L   Glucose, Bld 91 70 - 99 mg/dL   BUN 17 8 - 23 mg/dL   Creatinine, Ser 1.01 (H) 0.44 - 1.00 mg/dL   Calcium 6.9 (L) 8.9 - 10.3 mg/dL   GFR calc non Af Amer 55 (L) >60 mL/min   GFR calc Af Amer >60 >60 mL/min    Comment: (NOTE) The eGFR has been calculated using the CKD EPI equation. This calculation has not been validated in all clinical situations. eGFR's persistently <60 mL/min signify possible Chronic Kidney Disease.    Anion gap 5 5 - 15    Comment: Performed at [facility] [address] [place], [place] [address]  Reticulocytes     Status: Abnormal   Collection Time: 12/16/17  9:37 AM  Result Value Ref Range   Retic Ct Pct 0.9 0.4 - 3.1 %   RBC. 3.29 (L) 3.87 - 5.11 MIL/uL   Retic Count, Absolute 30.6 19.0 - 186.0 K/uL   Immature Retic Fract 28.3 (H) 2.3 - 15.9 %    Comment: Performed at [facility] [address] [place], [place] [address]  Vitamin B12     Status: Abnormal   Collection Time: 12/16/17  9:53 AM  Result Value Ref Range   Vitamin B-12 3,282 (H) 180 - 914 pg/mL    Comment: (NOTE) This assay is not validated for testing neonatal or myeloproliferative syndrome specimens for Vitamin B12 levels. Performed at [facility], [place] [address] [place], [place] [address]   Folate     Status: None   Collection Time: 12/16/17  9:53 AM  Result Value Ref Range   Folate 23.3 >5.9 ng/mL    Comment: Performed at  [facility] [address], [place], [place] [address]  Iron and TIBC     Status: Abnormal   Collection Time: 12/16/17  9:53 AM  Result Value Ref Range   Iron 14 (L) 28 - 170 ug/dL   TIBC 140 (L) 250 - 450 ug/dL   Saturation Ratios 10 (L) 10.4 - 31.8 %   UIBC 126 ug/dL    Comment: Performed at [facility], [place] [address], [place], [place] [address]  Ferritin     Status: Abnormal   Collection Time: 12/16/17  9:53 AM  Result Value Ref Range   Ferritin 712 (H) 11 - 307 ng/mL    Comment: Performed at [facility], [place] [address] [place], [place] [address]  CBC     Status: Abnormal   Collection Time: 12/17/17  5:30 AM  Result Value Ref Range   WBC 17.3 (H) 4.0 - 10.5 K/uL   RBC 3.23 (L) 3.87 - 5.11 MIL/uL   Hemoglobin 10.4 (L) 12.0 - 15.0 g/dL   HCT 32.1 (L) 36.0 - 46.0 %   MCV 99.4 80.0 - 100.0 fL   MCH 32.2 26.0 - 34.0 pg   MCHC 32.4 30.0 - 36.0 g/dL   RDW 14.8 11.5 - 15.5 %   Platelets 263 150 - 400 K/uL   nRBC 0.1 0.0 - 0.2 %  Comment: Performed at [facility], [place] [address] [place], [place] [address]  CK     Status: None   Collection Time: 12/17/17  5:30 AM  Result Value Ref Range   Total CK 162 38 - 234 U/L    Comment: Performed at [facility], [place] [address], [place], [place] [address]  Basic metabolic panel     Status: Abnormal   Collection Time: 12/17/17  5:30 AM  Result Value Ref Range   Sodium 139 135 - 145 mmol/L   Potassium 4.0 3.5 - 5.1 mmol/L   Chloride 111 98 - 111 mmol/L   CO2 20 (L) 22 - 32 mmol/L   Glucose, Bld 96 70 - 99 mg/dL   BUN 13 8 - 23 mg/dL   Creatinine, Ser 1.00 0.44 - 1.00 mg/dL   Calcium 6.5 (L) 8.9 - 10.3 mg/dL   GFR calc non Af Amer 56 (L) >60 mL/min   GFR calc Af Amer >60 >60 mL/min    Comment: (NOTE) The eGFR has been calculated using the CKD EPI equation. This calculation has not been validated in all clinical situations. eGFR's persistently <60 mL/min signify possible Chronic Kidney Disease.     Anion gap 8 5 - 15    Comment: Performed at [facility] [address], [place], [place] [address]  Magnesium     Status: None   Collection Time: 12/17/17  5:30 AM  Result Value Ref Range   Magnesium 1.7 1.7 - 2.4 mg/dL    Comment: Performed at [facility] [address] [place], [place] [address]  Phosphorus     Status: Abnormal   Collection Time: 12/17/17  5:30 AM  Result Value Ref Range   Phosphorus 2.3 (L) 2.5 - 4.6 mg/dL    Comment: Performed at [facility] [address], [place], [place] [address]   *Note: Due to a large number of results and/or encounters for the requested time period, some results have not been displayed. A complete set of results can be found in Results Review.    Dg Chest 2 View  Result Date: 12/17/2017 CLINICAL DATA:  Hypoxia. EXAM: CHEST - 2 VIEW COMPARISON:  Radiograph December 13, 2017. FINDINGS: The heart size and mediastinal contours are within normal limits. Minimal bibasilar subsegmental atelectasis is noted with small pleural effusions. No pneumothorax is noted. The visualized skeletal structures are unremarkable. IMPRESSION: Minimal bibasilar subsegmental atelectasis with small pleural effusions. Electronically Signed   By: Marijo Conception, M.D.   On: 12/17/2017 11:25   Dg Ankle Complete Left  Result Date: 12/16/2017 CLINICAL DATA:  Pain EXAM: LEFT ANKLE COMPLETE - 3+ VIEW COMPARISON:  None. FINDINGS: Frontal, oblique, and lateral views obtained. There is generalized soft tissue swelling. There is no acute fracture or joint effusion. There is mild generalized joint space narrowing. There is calcification adjacent to the distal tibial metaphysis medially which may represent residua of old trauma. There is mild chondrocalcinosis within the ankle joint medially. Ankle mortise appears intact. No erosive changes. IMPRESSION: Areas of osteoarthritic change noted. There is soft tissue swelling. Mild chondrocalcinosis is noted, a finding  that may be seen with osteoarthritis or with calcium pyrophosphate deposition disease. No evident fracture.  Ankle mortise appears intact. Electronically Signed   By: Lowella Grip III M.D.   On: 12/16/2017 14:43    Review of Systems  Constitutional: Positive for fever. Negative for chills and weight loss.  HENT: Negative for  ear discharge, ear pain, hearing loss and tinnitus.   Eyes: Negative for blurred vision, double vision, photophobia and pain.  Respiratory: Negative for cough, sputum production and shortness of breath.   Cardiovascular: Negative for chest pain.  Gastrointestinal: Negative for abdominal pain, nausea and vomiting.  Genitourinary: Negative for dysuria, flank pain, frequency and urgency.  Musculoskeletal: Positive for joint pain (Left ankle). Negative for back pain, falls, myalgias and neck pain.  Neurological: Negative for dizziness, tingling, sensory change, focal weakness, loss of consciousness and headaches.  Endo/Heme/Allergies: Does not bruise/bleed easily.  Psychiatric/Behavioral: Negative for depression, memory loss and substance abuse. The patient is not nervous/anxious.    Blood pressure (!) 140/55, pulse (!) 102, temperature 100.3 F (37.9 C), temperature source Oral, resp. rate 18, height _0  (1.549 m), weight 60.4 kg, SpO2 96 %. Physical Exam  Constitutional: She appears well-developed and well-nourished. No distress.  HENT:  Head: Normocephalic and atraumatic.  Eyes: Conjunctivae are normal. Right eye exhibits no discharge. Left eye exhibits no discharge. No scleral icterus.  Neck: Normal range of motion.  Cardiovascular: Normal rate and regular rhythm.  Respiratory: Effort normal. No respiratory distress.  Musculoskeletal:  LLE No traumatic wounds, ecchymosis, or rash  Ankle severe TTP, severe pain with PROM (would only let me range <10 degrees), mild effusion. Some TTP extending to distal lower leg and onto forefoot.  No knee effusion  Knee  stable to varus/ valgus and anterior/posterior stress  Sens DPN, SPN, TN intact  Motor EHL, ext, flex, evers 2/5  DP 2+, PT 2+, 2+ edema, = BLE  Neurological: She is alert.  Skin: Skin is warm and dry. She is not diaphoretic.  Psychiatric: She has a normal mood and affect. Her behavior is normal.    Assessment/Plan: Left ankle pain -- Aspiration performed, cloudy but no outright pus. NPO for now until GS comes back. Suspect pseudogout/gout. Multiple medical problems including anxiety, depression, asthma, breast cancer, chronic back pain, chronic neck pain, diverticulosis, DVT, fibromyalgia, GERD, Graves' disease, hyperlipidemia, IBS,andosteoporosis -- per primary team    Lisette Abu, PA-C Orthopedic Surgery [phone] 12/17/2017, 3:10 PM

## 2017-12-17 NOTE — Progress Notes (Signed)
Pt's temp is 103. Gave her Tylenol. MD made aware.

## 2017-12-18 DIAGNOSIS — M25572 Pain in left ankle and joints of left foot: Secondary | ICD-10-CM

## 2017-12-18 LAB — COMPREHENSIVE METABOLIC PANEL
ALT: 122 U/L — ABNORMAL HIGH (ref 0–44)
AST: 203 U/L — ABNORMAL HIGH (ref 15–41)
Albumin: 1.4 g/dL — ABNORMAL LOW (ref 3.5–5.0)
Alkaline Phosphatase: 76 U/L (ref 38–126)
Anion gap: 6 (ref 5–15)
BUN: 16 mg/dL (ref 8–23)
CO2: 19 mmol/L — ABNORMAL LOW (ref 22–32)
Calcium: 6.4 mg/dL — CL (ref 8.9–10.3)
Chloride: 111 mmol/L (ref 98–111)
Creatinine, Ser: 0.93 mg/dL (ref 0.44–1.00)
GFR calc Af Amer: >60 mL/min (ref >60)
GFR calc non Af Amer: >60 mL/min (ref >60)
Glucose, Bld: 121 mg/dL — ABNORMAL HIGH (ref 70–99)
Potassium: 3.6 mmol/L (ref 3.5–5.1)
Sodium: 136 mmol/L (ref 135–145)
Total Bilirubin: 0.7 mg/dL (ref 0.3–1.2)
Total Protein: 5.2 g/dL — ABNORMAL LOW (ref 6.5–8.1)

## 2017-12-18 LAB — CBC
HCT: 30 % — ABNORMAL LOW (ref 36.0–46.0)
Hemoglobin: 10 g/dL — ABNORMAL LOW (ref 12.0–15.0)
MCH: 32.2 pg (ref 26.0–34.0)
MCHC: 33.3 g/dL (ref 30.0–36.0)
MCV: 96.5 fL (ref 80.0–100.0)
Platelets: 285 10*3/uL (ref 150–400)
RBC: 3.11 MIL/uL — ABNORMAL LOW (ref 3.87–5.11)
RDW: 15.1 % (ref 11.5–15.5)
WBC: 14.7 10*3/uL — ABNORMAL HIGH (ref 4.0–10.5)
nRBC: 0 % (ref 0.0–0.2)

## 2017-12-18 LAB — CULTURE, BLOOD (ROUTINE X 2)
Culture: NO GROWTH
Culture: NO GROWTH
Special Requests: ADEQUATE

## 2017-12-18 LAB — PHOSPHORUS: Phosphorus: 2.2 mg/dL — ABNORMAL LOW (ref 2.5–4.6)

## 2017-12-18 LAB — PROCALCITONIN: Procalcitonin: 6.25 ng/mL

## 2017-12-18 LAB — MAGNESIUM: Magnesium: 1.7 mg/dL (ref 1.7–2.4)

## 2017-12-18 MED ORDER — SODIUM PHOSPHATES 45 MMOLE/15ML IV SOLN
30.0000 mmol | Freq: Once | INTRAVENOUS | Status: AC
  Administered 2017-12-18: 30 mmol via INTRAVENOUS
  Filled 2017-12-18: qty 10

## 2017-12-18 MED ORDER — PREDNISONE 50 MG PO TABS
60.0000 mg | ORAL_TABLET | Freq: Every day | ORAL | Status: AC
  Administered 2017-12-19 – 2017-12-21 (×3): 60 mg via ORAL
  Filled 2017-12-18 (×3): qty 1

## 2017-12-18 MED ORDER — CALCIUM CARBONATE 1250 (500 CA) MG PO TABS
1.0000 | ORAL_TABLET | Freq: Two times a day (BID) | ORAL | Status: DC
  Administered 2017-12-18 – 2017-12-24 (×13): 500 mg via ORAL
  Filled 2017-12-18 (×13): qty 1

## 2017-12-18 MED ORDER — POTASSIUM & SODIUM PHOSPHATES 280-160-250 MG PO PACK
1.0000 | PACK | Freq: Three times a day (TID) | ORAL | Status: DC
  Administered 2017-12-18 – 2017-12-20 (×5): 1 via ORAL
  Filled 2017-12-18 (×9): qty 1

## 2017-12-18 MED ORDER — SIMETHICONE 80 MG PO CHEW
160.0000 mg | CHEWABLE_TABLET | Freq: Four times a day (QID) | ORAL | Status: DC | PRN
  Administered 2017-12-18 – 2017-12-19 (×2): 160 mg via ORAL
  Filled 2017-12-18 (×2): qty 2

## 2017-12-18 NOTE — Progress Notes (Signed)
Physical Therapy Treatment Patient Details Name: Ebony Scott MRN: [id number] DOB: Jun 22, 1950 Today's Date: 12/18/2017    History of Present Illness PT is a 67 y.o. female with a hx of anxiety, depression, asthma, breast cancer, chronic back pain, chronic neck pain, DVT, fibromyalgia, and Graves' disease, who was found down by her daughter confused and soiled w/ vomit and feces on 12/13/2017. Last know to be well time 12/11/2017. CT negative for intracranial pathology.  Found with R LE DVT and small B PE, SIRS and low grade rhabdomyolysis per internal medicine.     PT Comments    Pt presented alert, HOB elevated and sitting in the chair. Pt reported continued B LE pain with increased pain on the L. Pt stated that she had been in the chair for ~ 2 hours and still felt fatigued. Pt attempted sit to stand with +2 assist with the LLE held of the ground but was unable due to weakness and fatigue. Pt was then transferred via maximove. Pt required +2 max assist for rolling in the chair to apply lift pad under. Pt would benefit from skilled PT in order to increase strength, mobility, transfers, and functional activities.     Follow Up Recommendations  CIR     Equipment Recommendations  Rolling walker with 5" wheels    Recommendations for Other Services       Precautions / Restrictions Precautions Precautions: Fall Precaution Comments:  Hesitant to move/WB LLE Restrictions Weight Bearing Restrictions: No    Mobility  Bed Mobility Overal bed mobility: Needs Assistance Bed Mobility: Rolling Rolling: Max assist;+2 for physical assistance(one therapist holding LLE) Sidelying to sit: Max assist;+2 for physical assistance       General bed mobility comments: Perfromed rolling in chair to place lift pad under  Transfers Overall transfer level: Needs assistance   Transfers: Sit to/from Stand Sit to Stand: (unable from chair )          Ambulation/Gait                  Stairs             Wheelchair Mobility    Modified Rankin (Stroke Patients Only)       Balance Overall balance assessment: Needs assistance Sitting-balance support: Bilateral upper extremity supported Sitting balance-Leahy Scale: Poor Sitting balance - Comments: requires up to mod assist to maintain sitting balance EOB without LE support Postural control: Right lateral lean                                  Cognition Arousal/Alertness: Awake/alert Behavior During Therapy: WFL for tasks assessed/performed Overall Cognitive Status: Within Functional Limits for tasks assessed                                 General Comments: Pt was very fatigue from sitting in chair.       Exercises      General Comments        Pertinent Vitals/Pain Pain Assessment: Faces Pain Score: 7  Faces Pain Scale: Hurts even more Pain Location: BLEs, L>R Pain Descriptors / Indicators: Grimacing;Sore Pain Intervention(s): Limited activity within patient's tolerance;Monitored during session;Repositioned    Home Living                      Prior Function  PT Goals (current goals can now be found in the care plan section) Acute Rehab PT Goals Patient Stated Goal: To feel better.  PT Goal Formulation: With patient/family Time For Goal Achievement: 12/29/17 Potential to Achieve Goals: Fair Progress towards PT goals: Not progressing toward goals - comment(decreased mobility due to fatigue )    Frequency    Min 4X/week      PT Plan Current plan remains appropriate    Co-evaluation              AM-PAC PT "6 Clicks" Daily Activity  Outcome Measure  Difficulty turning over in bed (including adjusting bedclothes, sheets and blankets)?: Unable Difficulty moving from lying on back to sitting on the side of the bed? : Unable Difficulty sitting down on and standing up from a chair with arms (e.g., wheelchair, bedside commode,  etc,.)?: Unable Help needed moving to and from a bed to chair (including a wheelchair)?: Total Help needed walking in hospital room?: Total Help needed climbing 3-5 steps with a railing? : Total 6 Click Score: 6    End of Session Equipment Utilized During Treatment: Gait belt Activity Tolerance: Patient limited by pain;Patient limited by fatigue Patient left: in bed;with call bell/phone within reach;with bed alarm set Nurse Communication: Mobility status;Other (comment) PT Visit Diagnosis: Unsteadiness on feet (R26.81);Other abnormalities of gait and mobility (R26.89);Muscle weakness (generalized) (M62.81);History of falling (Z91.81);Pain Pain - Right/Left: Left Pain - part of body: Ankle and joints of foot     Time: 1359-1443 PT Time Calculation (min) (ACUTE ONLY): 44 min  Charges:  $Therapeutic Activity: 38-52 mins                     Wandra Feinstein, SPT Acute Rehab [phone] (pager) [phone] (office)   Lila Lufkin 12/18/2017, 5:32 PM

## 2017-12-18 NOTE — Clinical Social Work Note (Signed)
Clinical Social Work Assessment  Patient Details  Name: Ebony Scott MRN: [id number] Date of Birth: September 02, 1950  Date of referral:  12/18/17               Reason for consult:  Facility Placement                Permission sought to share information with:  Facility Sport and exercise psychologist, Family Supports Permission granted to share information::  Yes, Verbal Permission Granted  Name::        Agency::  SNFs  Relationship::     Contact Information:     Housing/Transportation Living arrangements for the past 2 months:  Single Family Home Source of Information:  Patient Patient Interpreter Needed:  None Criminal Activity/Legal Involvement Pertinent to Current Situation/Hospitalization:  No - Comment as needed Significant Relationships:  Adult Children, Friend Lives with:  Self Do you feel safe going back to the place where you live?  No Need for family participation in patient care:  No (Coment)  Care giving concerns:  CSW received consult for possible SNF placement at time of discharge if CIR is unable to accept her. CSW spoke with patient regarding PT recommendation of SNF placement at time of discharge. She gave CSW permission for her best friend and ex-husband to remain in the room. Patient reported that her family is currently unable to care for patient at their home given patient's current physical needs and fall risk. Patient expressed understanding of PT recommendation and is agreeable to SNF placement at time of discharge if she does not get accepted to CIR. CSW to continue to follow and assist with discharge planning needs.   Social Worker assessment / plan:  CSW spoke with patient concerning possibility of rehab at [facility] before returning home.  Employment status:  Retired Nurse, adult PT Recommendations:  [place] / Referral to community resources:  [place]  Patient/Family's Response to care:  Patient  recognizes need for rehab before returning home and is agreeable to a SNF in [facility] but prefers to go to [facility]. CSW explained CIR criteria. Her friend at bedside encouraged patient to star moving around more and eating so she can be approved for CIR.   Patient/Family's Understanding of and Emotional Response to Diagnosis, Current Treatment, and Prognosis:  Patient/family is realistic regarding therapy needs and expressed being hopeful for CIR placement. Patient expressed understanding of CSW role and discharge process as well as medical condition. No questions/concerns about plan or treatment.    Emotional Assessment Appearance:  Appears stated age Attitude/Demeanor/Rapport:  Gracious Affect (typically observed):  Accepting, Appropriate Orientation:  Oriented to Self, Oriented to Place, Oriented to  Time, Oriented to Situation Alcohol / Substance use:  Not Applicable Psych involvement (Current and /or in the community):  No (Comment)  Discharge Needs  Concerns to be addressed:  Care Coordination Readmission within the last 30 days:  No Current discharge risk:  Dependent with Mobility Barriers to Discharge:  Continued Medical Work up   [employer], LCSW 12/18/2017, 4:01 PM

## 2017-12-18 NOTE — Progress Notes (Signed)
Occupational Therapy Treatment Patient Details Name: Ebony Scott MRN: [id number] DOB: [birth year] Today's Date: 12/18/2017    History of present illness PT is a 67 y.o. female with a hx of anxiety, depression, asthma, breast cancer, chronic back pain, chronic neck pain, diverticulosis, DVT, fibromyalgia, GERD, Graves' disease, hyperlipidemia, IBS, and osteoporosis who was found down by her daughter confused and soiled w/ vomit and feces. Last know to be well time on Wed. CT negative for intracranial pathology.  Found with R LE DVT and small B PE, SIRS and low grade rhabdomyolysis per internal medicine.    OT comments  Pt progressing towards established OT goals and is highly motivated to participate in therapy. Pt requiring Max A +2 for bed mobility and then abel to maintain sitting balance at EOB with Min Guard A. Pt performing squat pivot to drop arm recliner with Max A and second person elevating LLE. Pt performing grooming tasks in recliner up supervision-Min A. Pt also reporting that she has pain and weakness in right hand; noting decreased grasp strength in right hand compared to left. Continue to recommend dc to CIR and will continue to follow acutely as admitted.    Follow Up Recommendations  CIR    Equipment Recommendations  Other (comment)(TBD at next venue of care)    Recommendations for Other Services Rehab consult    Precautions / Restrictions Precautions Precautions: Fall Precaution Comments: Gout at left foot/ankle Restrictions Weight Bearing Restrictions: No       Mobility Bed Mobility Overal bed mobility: Needs Assistance Bed Mobility: Rolling;Sidelying to Sit Rolling: Min assist(roll to left with rail assist) Sidelying to sit: Max assist;+2 for physical assistance       General bed mobility comments: Pt participating by reaching to left and rolling with Min A. Max A to bring BLEs over EOB and then elevate trunk  Transfers Overall transfer level: Needs  assistance   Transfers: Squat Pivot Transfers     Squat pivot transfers: Max assist;+2 physical assistance     General transfer comment: Max A to squat and scoot towards right with use of pad to lift hips off bed. Second person to elevate LLE. Pt able to use BUEs to push hips up and to right. scooting to drop arm recliner    Balance Overall balance assessment: Needs assistance Sitting-balance support: Bilateral upper extremity supported Sitting balance-Leahy Scale: Poor Sitting balance - Comments: requires up to mod assist to maintain sitting balance EOB without LE support Postural control: Right lateral lean                                 ADL either performed or assessed with clinical judgement   ADL Overall ADL's : Needs assistance/impaired     Grooming: Sitting;Brushing hair;Minimal assistance;Set up Grooming Details (indicate cue type and reason): While seated in recliner with support, pt brushing her hair. Min A to spray detangle spray in her hair. Pt brushing her hair and reporting difficulty maintaining grasp on comb with right hand. Noting decreased hand strength                 Toilet Transfer: Maximal assistance;+2 for physical assistance;Squat-pivot;Requires drop arm Toilet Transfer Details (indicate cue type and reason): Max A to squat and scoot towards right with use of pad to lift hips off bed. Second person to elevate LLE. Pt able to use BUEs to push hips up and to right. scooting to  drop arm recliner         Functional mobility during ADLs: Maximal assistance;+2 for physical assistance(squat and scoot) General ADL Comments: Pt highly motivated and wanting to get OOB.      Vision   Vision Assessment?: No apparent visual deficits   Perception     Praxis      Cognition Arousal/Alertness: Awake/alert Behavior During Therapy: WFL for tasks assessed/performed Overall Cognitive Status: Within Functional Limits for tasks assessed                                  General Comments: Following cues and highly motivated to participate in therapy        Exercises     Shoulder Instructions       General Comments      Pertinent Vitals/ Pain       Pain Assessment: 0-10 Pain Score: 7  Pain Location: BLEs, L>R Pain Descriptors / Indicators: Grimacing;Sore Pain Intervention(s): Monitored during session;Repositioned;Limited activity within patient's tolerance  Home Living                                          Prior Functioning/Environment              Frequency  Min 2X/week        Progress Toward Goals  OT Goals(current goals can now be found in the care plan section)  Progress towards OT goals: Progressing toward goals  Acute Rehab OT Goals Patient Stated Goal: to figure out why her ankles hurt so badly OT Goal Formulation: With patient Time For Goal Achievement: 12/29/17 Potential to Achieve Goals: Good ADL Goals Pt Will Perform Grooming: with modified independence;standing Pt Will Perform Lower Body Bathing: with modified independence;sit to/from stand;with adaptive equipment Pt Will Perform Lower Body Dressing: with modified independence;sit to/from stand;with adaptive equipment Pt Will Transfer to Toilet: with modified independence;stand pivot transfer;ambulating;bedside commode Pt Will Perform Toileting - Clothing Manipulation and hygiene: with modified independence;sit to/from stand Pt/caregiver will Perform Home Exercise Program: Both right and left upper extremity;With written HEP provided;Increased strength;With theraband  Plan Discharge plan remains appropriate    Co-evaluation                 AM-PAC PT "6 Clicks" Daily Activity     Outcome Measure   Help from another person eating meals?: A Little Help from another person taking care of personal grooming?: A Little Help from another person toileting, which includes using toliet, bedpan, or urinal?:  A Lot Help from another person bathing (including washing, rinsing, drying)?: A Lot Help from another person to put on and taking off regular upper body clothing?: A Little Help from another person to put on and taking off regular lower body clothing?: Total 6 Click Score: 14    End of Session Equipment Utilized During Treatment: Gait belt  OT Visit Diagnosis: Other abnormalities of gait and mobility (R26.89);Muscle weakness (generalized) (M62.81);Pain Pain - Right/Left: (bil) Pain - part of body: Knee;Leg   Activity Tolerance Patient tolerated treatment well   Patient Left with call bell/phone within reach;with family/visitor present;in chair   Nurse Communication Mobility status;Need for lift equipment;Other (comment)(right hand weakness and pain)        Time: 5465-6812 OT Time Calculation (min): 33 min  Charges: OT General Charges $OT Visit: 1  Visit OT Treatments $Self Care/Home Management : 23-37 mins  [place], OTR/L Acute Rehab Pager: [phone] Office: [place] 12/18/2017, 2:15 PM

## 2017-12-18 NOTE — Progress Notes (Signed)
PT Progress Note for Charges    12/18/17 1700  PT General Charges  $$ ACUTE PT VISIT 1 Visit  PT Treatments  $Therapeutic Activity 38-52 mins  Sherie Don, [place], DPT  Acute Rehabilitation Services Pager [phone] Office [phone]

## 2017-12-18 NOTE — Progress Notes (Signed)
PROGRESS NOTE    Ebony Scott  ZWC:[id number] DOB: 04/25/1950 DOA: 12/13/2017 PCP: [name], MD    Brief Narrative:  67 y.o.femalewith a hx of anxiety, depression, asthma, breast cancer, chronic back pain, chronic neck pain, diverticulosis, DVT, fibromyalgia, GERD, Graves' disease, hyperlipidemia, IBS, and osteoporosis who was found down by her daughter confused and soiled w/ vomit and feces. She does not remember what happened or having any symptoms prior to losing consciousness. In the ED her vitals were unremarkable and a CXR did not show any significant abnormality. CT head did not show any acute intracranial pathology. Bilateral lower extremities duplex showed RLE DVT.  CT angiogram of the chest shows bilateral small pulmonary embolism.    Assessment & Plan:   Principal Problem: Acute bilateral PE Active Problems:   Hyperthyroidism   Hyperlipidemia   GERD   Pseudogout   Chronic diastolic CHF (congestive heart failure) (HCC)   Rhabdomyolysis   Acute deep vein thrombosis (DVT) of right lower extremity (HCC)   Acute bilateral PE and acute right lower extremity DVT She was started on IV heparin and transition to oral Xarelto. She denies any sob or chest pain and has good sats on RA.    SIRS, sepsis ruled out  probably from rhabdomyolysis and acute pulmonary embolism.   Acute rhabdomyolysis; Improving CK, .  Renal function and urine output continues to improve with IV fluids, Pain control with oral tramadol and tylenol,. PT recommending CIR on discharge.     Chronic diastolic heart failure  CXR shows small bilateral pleural effusions, with atelectasis, she does not appear to be fluid overloaded.  Monitor.    Hyperlipidemia holding statin due to acute rhabdomyolysis at this time.   History of hyper parathyroidism TSH is around 5,  patient's PCP recommended holding methimazole for 1 month from October 17.   GERD Continue with Protonix.   Severe left  ankle pain and swelling,  Differential include, fracture vs gout vs pseudo gout flare up.   x rays of the left ankle , done showed soft tissue swelling and chondrocalcinosis.  She was empirically started on colchicine, if her pain does not improve in the next 12 hours , she might need IV steroids.     Severe Hypophosphatemia:Moderate malnutrition : Repleted, repeat levels are pending.   Nutrition will be consulted, will need to watch for refeeding syndrome.   Hypoalbuminemia:  Nutrition consulted for protein supplementation   Moderate to severe generalized weakness:  probably from laying on the floor from wednesday till Friday, continuous rhabdomyolysis and severe hypophosphatemia.  Improving, recommend PT evaluation today.    Hypocalcemia:  Will need replacements    Elevated liver function panel: Appear to be improving.    Iron deficiency anemia:  Iron supplementation will be added.    Leukocytosis fever of 103 yesterday, and left ankle swelling.  Suspect from gout, with superimposed cellulitis. . X rays show soft tissue swelling, will also add IV clindamycin for cellulitis . Orthopedics consulted and she underwent aspiration of the ankle joint, showing crystals and only 11,500, possibly pseudo vs gout flare up. Prednisone added to the regimen to as she continues to have severe pain in the ankle and unable to bear weight on the foot due to pain.    DVT prophylaxis: Heparin transitioned to Xarelto Code Status: Full code Family Communication: discussed with daughter over the phone.  disposition Plan: Pending clinical improvement  Consultants:  Orthopedics.    Procedures:  Left ankle aspiration.  Antimicrobials: clindamycin for cellulitis    Subjective: Discussed the plan with the patient and the daughter over the phone.  Pain is persistent in the left ankle, still swollen but erythema improved.  Incentive spirometry ordered for atelectasis.   Objective: Vitals:     12/18/17 0033 12/18/17 0542 12/18/17 0543 12/18/17 0812  BP:   135/61   Pulse:   87   Resp:   (!) 24   Temp: 99.7 F (37.6 C) (!) 100.4 F (38 C)    TempSrc: Oral Axillary    SpO2:   93% 94%  Weight:      Height:        Intake/Output Summary (Last 24 hours) at 12/18/2017 1033 Last data filed at 12/18/2017 0544 Gross per 24 hour  Intake 394.85 ml  Output 2350 ml  Net -1955.15 ml   Filed Weights   12/13/17 1026 12/14/17 0207  Weight: 61.2 kg 60.4 kg    Examination:  General exam: alert today, and still laying in the bed, not in distress.  Respiratory system: air entry fair, no wheezing or rhonchi.   Cardiovascular system: S1 & S2 heard, RRR. No JVD  Gastrointestinal system: Abdomen is soft non tender non distended bowel sounds good. Central nervous system: alert, oriented and answering questions appropriately.  Extremities: decreased strength in bilateral lower extremities. Ankle swelling and pain persistent, erythema over the ankle improving.  Skin: No rashes, lesions or ulcers Psychiatry: sleepy, mood appropriate.     Data Reviewed: I have personally reviewed following labs and imaging studies  CBC: Recent Labs  Lab 12/13/17 1051  12/14/17 0434 12/15/17 0342 12/16/17 0414 12/17/17 0530 12/17/17 2052 12/18/17 0256  WBC 13.4*  --  12.8* 10.4 12.5* 17.3*  --  14.7*  NEUTROABS 10.2*  --  10.1*  --   --   --   --   --   HGB 14.5   < > 12.5 10.7* 10.0* 10.4* 10.3* 10.0*  HCT 44.3   < > 39.3 32.3* 31.7* 32.1* 32.1* 30.0*  MCV 99.3  --  100.0 98.2 100.3* 99.4  --  96.5  PLT 201  --  203 201 215 263  --  285   < > = values in this interval not displayed.   Basic Metabolic Panel: Recent Labs  Lab 12/13/17 1051 12/13/17 1058 12/13/17 1849 12/14/17 0434 12/15/17 0342 12/16/17 0414 12/17/17 0530  NA 139 138  --  141 138 137 139  K 3.7 4.0  --  3.7 3.3* 4.8 4.0  CL 101 103  --  105 109 111 111  CO2 22  --   --  21* 21* 21* 20*  GLUCOSE 126* 126*  --  103*  98 91 96  BUN 29* 44*  --  24* 20 17 13   CREATININE 1.47* 1.40*  --  1.19* 1.07* 1.01* 1.00  CALCIUM 8.5*  --   --  8.0* 7.1* 6.9* 6.5*  MG  --   --  2.3  --   --   --  1.7  PHOS  --   --  2.1*  --   --  1.2* 2.3*   GFR: Estimated Creatinine Clearance: 44.3 mL/min (by C-G formula based on SCr of 1 mg/dL). Liver Function Tests: Recent Labs  Lab 12/13/17 1051 12/15/17 0342  AST 87* 85*  ALT 55* 61*  ALKPHOS 72 58  BILITOT 2.3* 0.9  PROT 6.7 5.2*  ALBUMIN 2.8* 2.0*   No results for input(s): LIPASE, AMYLASE in  the last 168 hours. No results for input(s): AMMONIA in the last 168 hours. Coagulation Profile: No results for input(s): INR, PROTIME in the last 168 hours. Cardiac Enzymes: Recent Labs  Lab 12/13/17 1051 12/14/17 0434 12/15/17 0342 12/16/17 0414 12/17/17 0530  CKTOTAL 2,446* 1,465* 559* 327* 162   BNP (last 3 results) No results for input(s): PROBNP in the last 8760 hours. HbA1C: No results for input(s): HGBA1C in the last 72 hours. CBG: No results for input(s): GLUCAP in the last 168 hours. Lipid Profile: No results for input(s): CHOL, HDL, LDLCALC, TRIG, CHOLHDL, LDLDIRECT in the last 72 hours. Thyroid Function Tests: No results for input(s): TSH, T4TOTAL, FREET4, T3FREE, THYROIDAB in the last 72 hours. Anemia Panel: Recent Labs    12/16/17 0937 12/16/17 0953  VITAMINB12  --  3,282*  FOLATE  --  23.3  FERRITIN  --  712*  TIBC  --  140*  IRON  --  14*  RETICCTPCT 0.9  --    Sepsis Labs: Recent Labs  Lab 12/13/17 1059 12/13/17 1629 12/17/17 1415 12/18/17 0256  PROCALCITON  --   --  6.03 6.25  LATICACIDVEN 2.28* 1.52 1.0  --     Recent Results (from the past 240 hour(s))  Blood Culture (routine x 2)     Status: None (Preliminary result)   Collection Time: 12/13/17 11:38 AM  Result Value Ref Range Status   Specimen Description BLOOD RIGHT ANTECUBITAL  Final   Special Requests   Final    BOTTLES DRAWN AEROBIC AND ANAEROBIC Blood Culture  results may not be optimal due to an excessive volume of blood received in culture bottles   Culture   Final    NO GROWTH 4 DAYS Performed at [facility], [place] [address], [place], [place] [address]    Report Status PENDING  Incomplete  Blood Culture (routine x 2)     Status: None (Preliminary result)   Collection Time: 12/13/17 11:53 AM  Result Value Ref Range Status   Specimen Description BLOOD BLOOD RIGHT HAND  Final   Special Requests   Final    BOTTLES DRAWN AEROBIC AND ANAEROBIC Blood Culture adequate volume   Culture   Final    NO GROWTH 4 DAYS Performed at [facility], [place] [address], [place], [place] [address]    Report Status PENDING  Incomplete  Urine culture     Status: None   Collection Time: 12/13/17  2:41 PM  Result Value Ref Range Status   Specimen Description URINE, RANDOM  Final   Special Requests NONE  Final   Culture   Final    NO GROWTH Performed at [facility], [address] [place], [place] [address]    Report Status 12/14/2017 FINAL  Final  MRSA PCR Screening     Status: None   Collection Time: 12/14/17  2:45 AM  Result Value Ref Range Status   MRSA by PCR NEGATIVE NEGATIVE Final    Comment:        The GeneXpert MRSA Assay (FDA approved for NASAL specimens only), is one component of a comprehensive MRSA colonization surveillance program. It is not intended to diagnose MRSA infection nor to guide or monitor treatment for MRSA infections. Performed at [facility], [place] [address] [place], [place] [address]   Body fluid culture     Status: None (Preliminary result)   Collection Time: 12/17/17  3:10 PM  Result Value Ref Range Status  Specimen Description SYNOVIAL  Final   Special Requests NONE  Final   Gram Stain   Final    FEW WBC PRESENT,BOTH PMN AND MONONUCLEAR NO ORGANISMS SEEN    Culture   Final    NO GROWTH < 24 HOURS Performed at [facility], [place] [address] [place], [place] [address]    Report  Status PENDING  Incomplete         Radiology Studies: Dg Chest 2 View  Result Date: 12/17/2017 CLINICAL DATA:  Hypoxia. EXAM: CHEST - 2 VIEW COMPARISON:  Radiograph December 13, 2017. FINDINGS: The heart size and mediastinal contours are within normal limits. Minimal bibasilar subsegmental atelectasis is noted with small pleural effusions. No pneumothorax is noted. The visualized skeletal structures are unremarkable. IMPRESSION: Minimal bibasilar subsegmental atelectasis with small pleural effusions. Electronically Signed   By: Marijo Conception, M.D.   On: 12/17/2017 11:25   Dg Ankle Complete Left  Result Date: 12/16/2017 CLINICAL DATA:  Pain EXAM: LEFT ANKLE COMPLETE - 3+ VIEW COMPARISON:  None. FINDINGS: Frontal, oblique, and lateral views obtained. There is generalized soft tissue swelling. There is no acute fracture or joint effusion. There is mild generalized joint space narrowing. There is calcification adjacent to the distal tibial metaphysis medially which may represent residua of old trauma. There is mild chondrocalcinosis within the ankle joint medially. Ankle mortise appears intact. No erosive changes. IMPRESSION: Areas of osteoarthritic change noted. There is soft tissue swelling. Mild chondrocalcinosis is noted, a finding that may be seen with osteoarthritis or with calcium pyrophosphate deposition disease. No evident fracture.  Ankle mortise appears intact. Electronically Signed   By: Lowella Grip III M.D.   On: 12/16/2017 14:43   US Pelvis Limited (transabdominal Only)  Result Date: 12/17/2017 CLINICAL DATA:  Vaginal bleeding. EXAM: TRANSABDOMINAL ULTRASOUND OF PELVIS TECHNIQUE: Transabdominal ultrasound examination of the pelvis was performed including evaluation of the uterus, ovaries, adnexal regions, and pelvic cul-de-sac. COMPARISON:  None. FINDINGS: Uterus Previous hysterectomy. Endometrium Previous hysterectomy. Right ovary Not visualized. Left ovary Not visualized.  Other findings: No abnormal free fluid. Possible echogenic debris within the dependent portion of the bladder which could be seen with cystitis. IMPRESSION: Prior hysterectomy. Nonvisualization of the ovaries. No free pelvic fluid. Possible echogenic debris within the dependent portion of the bladder which could be seen with cystitis. Electronically Signed   By: Marin Olp M.D.   On: 12/17/2017 23:46        Scheduled Meds: . colchicine  0.6 mg Oral BID  . feeding supplement (ENSURE ENLIVE)  237 mL Oral TID WC  . ferrous sulfate  325 mg Oral Q breakfast  . gabapentin  600 mg Oral QHS  . lactase  6,000 Units Oral TID WC  . mirtazapine  30 mg Oral QHS  . mometasone-formoterol  1 puff Inhalation BID  . montelukast  10 mg Oral Daily  . pantoprazole  40 mg Oral Daily  . rivaroxaban  15 mg Oral BID WC   Followed by  . [START ON 01/05/2018] rivaroxaban  20 mg Oral Q supper  . umeclidinium bromide  1 puff Inhalation Daily   Continuous Infusions: . clindamycin (CLEOCIN) IV 600 mg (12/18/17 0539)     LOS: 5 days    Time spent: 36 minutes.     Hosie Poisson, MD [facility] Pager [phone]   If 7PM-7AM, please contact night-coverage [email or web address] Password [facility] 12/18/2017, 10:33 AM    RN reported that she spiked fevers, .  Ordered Blood  cultures, pro calcitonin and lactic acid.  Requested orthopedics for evaluation of septic arthritis of the left ankle.    Hosie Poisson MD [phone]

## 2017-12-18 NOTE — Progress Notes (Signed)
Pt family approached desk stating that pt was very cold and shivering and would like to have her temperature taken. RN took temp twice both orally and axillary with results of both charted.   Oral: 98.3 F Axillary: 99.1 F  Primary RN notified

## 2017-12-18 NOTE — Progress Notes (Signed)
ANTICOAGULATION CONSULT NOTE - Initial Consult  Pharmacy Consult for rivaroxaban Indication: VTE  Allergies  Allergen Reactions  . Dust Mite Extract Shortness Of Breath and Other (See Comments)    "sneezing" (02/20/2012)  . Molds & Smuts Shortness Of Breath  . Morphine Sulfate Itching  . Other Shortness Of Breath and Other (See Comments)    Grass and weeds "sneezing; filled sinuses" (02/20/2012)  . Penicillins Rash and Other (See Comments)    "welts" (02/20/2012) Has patient had a PCN reaction causing immediate rash, facial/tongue/throat swelling, SOB or lightheadedness with hypotension: Unknown Has patient had a PCN reaction causing severe rash involving mucus membranes or skin necrosis: No Has patient had a PCN reaction that required hospitalization: No Has patient had a PCN reaction occurring within the last 10 years: No If all of the above answers are "NO", then may proceed with Cephalosporin use.   Marland Kitchen Reclast [Zoledronic Acid] Other (See Comments)    Fever, Put in hospital, dr said it was a reaction from a reaction   . Rofecoxib Swelling    Vioxx REACTION: feet swelling  . Shrimp Flavor Anaphylaxis    ALL SHELLFISH  . Tetracycline Hcl Nausea And Vomiting  . Xolair [Omalizumab] Other (See Comments)    Caused Blood clot  . Dilaudid [Hydromorphone Hcl] Itching  . Hydrocodone-Acetaminophen Nausea And Vomiting  . Levofloxacin Other (See Comments)    REACTION: GI upset  . Oxycodone Hcl Nausea And Vomiting  . Paroxetine Nausea And Vomiting    Paxil   . Celecoxib Swelling    Feet swelling  . Diltiazem Swelling  . Lactose Intolerance (Gi) Other (See Comments)    Bloating and gas  . Tree Extract Other (See Comments)    "tested and told I was allergic to it; never experienced a reaction to it" (02/20/2012)    Patient Measurements: Height: 5\' 1"  (154.9 cm) Weight: 133 lb 2.5 oz (60.4 kg) IBW/kg (Calculated) : 47.8   Vital Signs: Temp: 100.4 F (38 C) (10/30 0542) Temp  Source: Axillary (10/30 0542) BP: 135/61 (10/30 0543) Pulse Rate: 87 (10/30 0543)  Labs: Recent Labs    12/16/17 0414 12/17/17 0530 12/17/17 2052 12/18/17 0256  HGB 10.0* 10.4* 10.3* 10.0*  HCT 31.7* 32.1* 32.1* 30.0*  PLT 215 263  --  285  CREATININE 1.01* 1.00  --   --   CKTOTAL 327* 162  --   --     Estimated Creatinine Clearance: 44.3 mL/min (by C-G formula based on SCr of 1 mg/dL).   Medical History: Past Medical History:  Diagnosis Date  . Allergic rhinitis   . Allergy    SEASONAL  . Anemia   . Anxiety    pt denies  . Arthritis    "mostly the hands" (02/20/2012)  . Asthma   . Breast cancer ([place]) 1998   in remission  . Cataract    REMOVED  . Chronic lower back pain   . Chronic neck pain   . Complication of anesthesia    "had hard time waking up from it several times" (02/20/2012)  . Depression    "some; don't take anything for it" (02/20/2012)  . Diverticulosis   . DVT (deep venous thrombosis) ([place])   . Exertional dyspnea   . Fibromyalgia 11/2011  . GERD (gastroesophageal reflux disease)   . Graves disease   . Headache(784.0)    "related to allergies; more at different times during the year" (02/20/2012)  . Hemorrhoids   . Hiatal hernia  back and neck  . Hx of adenomatous colonic polyps 04/12/2016  . Hypercholesteremia    good cholesterol is high  . Hypothyroidism   . IBS (irritable bowel syndrome)   . Moderate persistent asthma    -FeV1 72% 2011, -IgE 102 2011, CT sinus Neg 2011  . Osteoporosis    on reclast yearly  . Pneumonia 04/2011; ~ 11/2011   "double; single" (02/20/2012)     Assessment: Pt is a 45 YOF presenting with altered mental status after falling at home. Pt does have history of DVT; no anticoagulation PTA. CTA reveals small acute pulmonary emboli in both lungs. Bilateral lower extremities duplex showed RLE DVT. Patient started on rivaroxaban per pharmacy. No bleeding noted.   Goal of Therapy:  Monitor platelets by anticoagulation  protocol: Yes   Plan:  Xarelto 15mg  BID x 21d then 20mg  qday Monitor for bleeding  Iona Stay A. Levada Dy, PharmD, [place] Pager: [phone] Please utilize Amion for appropriate phone number to reach the unit pharmacist ([place])   12/18/2017,9:57 AM

## 2017-12-18 NOTE — Progress Notes (Signed)
IP rehab admissions - I met briefly with patient.  She was busy with PT and had "toileting Needs" at the time.  I will see patient again in am to discuss rehab options.  Call me for questions.  [phone]

## 2017-12-18 NOTE — Progress Notes (Signed)
   Subjective:    Less pain in left ankle.  Has not been OOB with therapy yet.     Objective: Vital signs in last 24 hours: Temp:  [98.2 F (36.8 C)-103 F (39.4 C)] 100.4 F (38 C) (10/30 0542) Pulse Rate:  [87-102] 87 (10/30 0543) Resp:  [18-24] 24 (10/30 0543) BP: (131-151)/(55-74) 135/61 (10/30 0543) SpO2:  [92 %-96 %] 93 % (10/30 0543)  Intake/Output from previous day: 10/29 0701 - 10/30 0700 In: 574.9 [P.O.:350; IV Piggyback:224.9] Out: 3050 [Urine:3050] Intake/Output this shift: No intake/output data recorded.  Recent Labs    12/16/17 0414 12/17/17 0530 12/17/17 2052 12/18/17 0256  HGB 10.0* 10.4* 10.3* 10.0*   Recent Labs    12/17/17 0530 12/17/17 2052 12/18/17 0256  WBC 17.3*  --  14.7*  RBC 3.23*  --  3.11*  HCT 32.1* 32.1* 30.0*  PLT 263  --  285   Recent Labs    12/16/17 0414 12/17/17 0530  NA 137 139  K 4.8 4.0  CL 111 111  CO2 21* 20*  BUN 17 13  CREATININE 1.01* 1.00  GLUCOSE 91 96  CALCIUM 6.9* 6.5*   No results for input(s): LABPT, INR in the last 72 hours.  sensation intact to legs and feet. can wiggle toes. minimal ankle movement. left ankle not warm, no cellulitis.  Dg Chest 2 View  Result Date: 12/17/2017 CLINICAL DATA:  Hypoxia. EXAM: CHEST - 2 VIEW COMPARISON:  Radiograph December 13, 2017. FINDINGS: The heart size and mediastinal contours are within normal limits. Minimal bibasilar subsegmental atelectasis is noted with small pleural effusions. No pneumothorax is noted. The visualized skeletal structures are unremarkable. IMPRESSION: Minimal bibasilar subsegmental atelectasis with small pleural effusions. Electronically Signed   By: Marijo Conception, M.D.   On: 12/17/2017 11:25   US Pelvis Limited (transabdominal Only)  Result Date: 12/17/2017 CLINICAL DATA:  Vaginal bleeding. EXAM: TRANSABDOMINAL ULTRASOUND OF PELVIS TECHNIQUE: Transabdominal ultrasound examination of the pelvis was performed including evaluation of the uterus,  ovaries, adnexal regions, and pelvic cul-de-sac. COMPARISON:  None. FINDINGS: Uterus Previous hysterectomy. Endometrium Previous hysterectomy. Right ovary Not visualized. Left ovary Not visualized. Other findings: No abnormal free fluid. Possible echogenic debris within the dependent portion of the bladder which could be seen with cystitis. IMPRESSION: Prior hysterectomy. Nonvisualization of the ovaries. No free pelvic fluid. Possible echogenic debris within the dependent portion of the bladder which could be seen with cystitis. Electronically Signed   By: Marin Olp M.D.   On: 12/17/2017 23:46    Assessment/Plan:    Gram stain neg. Cultures neg.   Intracellular calcium pyrophosphate crystals noted on synovial fluid. Needs therapy for mobilization.   Ebony Scott 12/18/2017, 7:12 AM

## 2017-12-18 NOTE — Progress Notes (Signed)
CRITICAL VALUE ALERT  Critical Value:  6.4 calcium  Date & Time Notied:  12/18/17 at 1226  Provider Notified: Karleen Hampshire,   Orders Received/Actions taken: Okay. Continue to monitor pt.

## 2017-12-18 NOTE — Plan of Care (Signed)
  Problem: Activity: Goal: Risk for activity intolerance will decrease Outcome: Progressing   Problem: Elimination: Goal: Will not experience complications related to bowel motility Outcome: Progressing   Problem: Pain Managment: Goal: General experience of comfort will improve Outcome: Progressing   Problem: Safety: Goal: Ability to remain free from injury will improve Outcome: Progressing   

## 2017-12-18 NOTE — NC FL2 (Signed)
[place] LEVEL OF CARE SCREENING TOOL     IDENTIFICATION  Patient Name: Ebony Scott Birthdate: June 01, 1950 Sex: female Admission Date (Current Location): 12/13/2017  [facility] and [place] Number:  Herbalist and Address:  [facility] [place], [place] [address] [place], [place] [address]      Provider Number: [id number]  Attending Physician Name and Address:  Hosie Poisson, MD  Relative Name and Phone Number:  Herbie [place], son, [phone]    Current Level of Care: Hospital Recommended Level of Care: [place] Prior Approval Number:    Date Approved/Denied:   PASRR Number: [id number]  Discharge Plan: SNF    Current Diagnoses: Patient Active Problem List   Diagnosis Date Noted  . Acute left ankle pain   . Malnutrition of moderate degree 12/17/2017  . Knee pain   . History of breast cancer   . Chronic pain syndrome   . Fibromyalgia   . Graves disease   . Leukocytosis   . Sepsis due to undetermined organism ([place]) 12/13/2017  . Rhabdomyolysis 12/13/2017  . Acute deep vein thrombosis (DVT) of right lower extremity ([place]) 12/13/2017  . Multiple lung nodules on CT   . CKD (chronic kidney disease), stage III ([place]) 04/15/2016  . Hx of adenomatous colonic polyps 04/12/2016  . Cough 02/09/2016  . Opacity of lung on imaging study 11/03/2015  . Severe persistent asthma 02/08/2015  . Facial pain syndrome 02/08/2015  . Insomnia 02/08/2015  . Other allergic rhinitis 02/08/2015  . LPRD (laryngopharyngeal reflux disease) 02/08/2015  . Chronic diastolic CHF (congestive heart failure) ([place]) 02/08/2015  . Nausea without vomiting 04/08/2014  . Benign paroxysmal positional vertigo 12/03/2012  . Dizziness and giddiness 10/29/2012  . Pseudogout 02/24/2012  . Pneumonia 05/31/2011  . IRRITABLE BOWEL SYNDROME 01/02/2010  . Hyperlipidemia 12/23/2009  . Hyperthyroidism 11/12/2006  . Seasonal and perennial allergic rhinitis 11/12/2006  .  GERD 11/12/2006  . NECK PAIN, CHRONIC 11/12/2006  . OSTEOPOROSIS 11/12/2006  . BREAST CANCER, HX OF 11/12/2006    Orientation RESPIRATION BLADDER Height & Weight     Self, Time, Situation, Place  Normal Incontinent, External catheter Weight: 60.4 kg Height:  5\' 1"  (154.9 cm)  BEHAVIORAL SYMPTOMS/MOOD NEUROLOGICAL BOWEL NUTRITION STATUS      Continent Diet(Please see DC Summary)  AMBULATORY STATUS COMMUNICATION OF NEEDS Skin   Extensive Assist Verbally Normal                       Personal Care Assistance Level of Assistance  Bathing, Feeding, Dressing Bathing Assistance: Maximum assistance Feeding assistance: Independent Dressing Assistance: Limited assistance     Functional Limitations Info  Sight, Hearing, Speech Sight Info: Adequate Hearing Info: Adequate Speech Info: Adequate    SPECIAL CARE FACTORS FREQUENCY  PT (By licensed PT), OT (By licensed OT)     PT Frequency: 5x/week OT Frequency: 3x/week            Contractures Contractures Info: Not present    Additional Factors Info  Code Status, Allergies Code Status Info: Full Allergies Info: Allergies:  Dust Mite Extract, Molds & Smuts, Morphine Sulfate, Other, Penicillins, Reclast Zoledronic Acid, Rofecoxib, Shrimp Flavor, Tetracycline Hcl, Xolair Omalizumab, Dilaudid Hydromorphone Hcl, Hydrocodone-acetaminophen, Levofloxacin, Oxycodone Hcl, Paroxetine, Celecoxib, Diltiazem, Lactose Intolerance (Gi), Tree Extract           Current Medications (12/18/2017):  This is the current hospital active medication list Current Facility-Administered Medications  Medication Dose Route Frequency Provider Last  Rate Last Dose  . acetaminophen (TYLENOL) tablet 650 mg  650 mg Oral Q6H PRN Cherene Altes, MD   650 mg at 12/18/17 0607  . calcium carbonate (OS-CAL - dosed in mg of elemental calcium) tablet 500 mg of elemental calcium  1 tablet Oral BID WC Hosie Poisson, MD      . clindamycin (CLEOCIN) IVPB 600 mg  600  mg Intravenous Q8H Hosie Poisson, MD 100 mL/hr at 12/18/17 1506 600 mg at 12/18/17 1506  . colchicine tablet 0.6 mg  0.6 mg Oral BID Hosie Poisson, MD   0.6 mg at 12/18/17 0921  . feeding supplement (ENSURE ENLIVE) (ENSURE ENLIVE) liquid 237 mL  237 mL Oral TID WC Hosie Poisson, MD   237 mL at 12/18/17 1506  . ferrous sulfate tablet 325 mg  325 mg Oral Q breakfast Hosie Poisson, MD   325 mg at 12/18/17 0920  . gabapentin (NEURONTIN) tablet 600 mg  600 mg Oral QHS Hosie Poisson, MD   600 mg at 12/17/17 2146  . lactase (LACTAID) tablet 6,000 Units  6,000 Units Oral TID WC Blount, Lolita Cram, NP   6,000 Units at 12/17/17 0933  . mirtazapine (REMERON SOL-TAB) disintegrating tablet 30 mg  30 mg Oral QHS Cherene Altes, MD   30 mg at 12/17/17 2146  . mometasone-formoterol (DULERA) 200-5 MCG/ACT inhaler 1 puff  1 puff Inhalation BID Cherene Altes, MD   1 puff at 12/18/17 0809  . montelukast (SINGULAIR) tablet 10 mg  10 mg Oral Daily Cherene Altes, MD   10 mg at 12/18/17 0920  . ondansetron (ZOFRAN) tablet 4 mg  4 mg Oral Q6H PRN Reubin Milan, MD       Or  . ondansetron [facility]) injection 4 mg  4 mg Intravenous Q6H PRN Reubin Milan, MD      . pantoprazole (PROTONIX) EC tablet 40 mg  40 mg Oral Daily Reubin Milan, MD   40 mg at 12/18/17 0920  . potassium & sodium phosphates (PHOS-NAK) 280-160-250 MG packet 1 packet  1 packet Oral TID WC & HS Hosie Poisson, MD      . Derrill Memo ON 12/19/2017] predniSONE (DELTASONE) tablet 60 mg  60 mg Oral QAC breakfast Hosie Poisson, MD      . Rivaroxaban (XARELTO) tablet 15 mg  15 mg Oral BID WC Hosie Poisson, MD   15 mg at 12/18/17 0920   Followed by  . [START ON 01/05/2018] rivaroxaban (XARELTO) tablet 20 mg  20 mg Oral Q supper Hosie Poisson, MD      . simethicone (MYLICON) chewable tablet 160 mg  160 mg Oral QID PRN Schorr, Rhetta Mura, NP   160 mg at 12/18/17 0607  . sodium phosphate 30 mmol in dextrose 5 % 250 mL infusion  30 mmol  Intravenous Once Hosie Poisson, MD      . traMADol (ULTRAM) tablet 50 mg  50 mg Oral Q6H PRN Hosie Poisson, MD      . umeclidinium bromide (INCRUSE ELLIPTA) 62.5 MCG/INH 1 puff  1 puff Inhalation Daily Cherene Altes, MD   1 puff at 12/18/17 2409     Discharge Medications: Please see discharge summary for a list of discharge medications.  Relevant Imaging Results:  Relevant Lab Results:   Additional Information SSN: [id number]  [place], [place]

## 2017-12-18 NOTE — Care Management Note (Signed)
Case Management Note  Patient Details  Name: Ebony Scott MRN: [id number] Date of Birth: 09/03/1950  Subjective/Objective:    Acute bilateral PE, CHF, DVT                Action/Plan: Spoke to pt and states she lives alone. She has 14 steps in her home. She wants IP rehab. Notified attending for CIR consult. PT/OT recommended IP rehab. CSW following for SNF.   Expected Discharge Date:                Expected Discharge Plan:  [place]  In-House Referral:  Clinical Social Work  Discharge planning Services  CM Consult  Post Acute Care Choice:  NA Choice offered to:  NA  DME Arranged:  N/A DME Agency:  NA  HH Arranged:  NA HH Agency:  NA  Status of Service:  In process, will continue to follow  If discussed at Long Length of Stay Meetings, dates discussed:    Additional Comments:  Erenest Rasher, RN 12/18/2017, 2:20 PM

## 2017-12-19 LAB — CBC
HCT: 27.8 % — ABNORMAL LOW (ref 36.0–46.0)
Hemoglobin: 9.3 g/dL — ABNORMAL LOW (ref 12.0–15.0)
MCH: 31.8 pg (ref 26.0–34.0)
MCHC: 33.5 g/dL (ref 30.0–36.0)
MCV: 95.2 fL (ref 80.0–100.0)
Platelets: 327 10*3/uL (ref 150–400)
RBC: 2.92 MIL/uL — ABNORMAL LOW (ref 3.87–5.11)
RDW: 15.1 % (ref 11.5–15.5)
WBC: 13.2 10*3/uL — ABNORMAL HIGH (ref 4.0–10.5)
nRBC: 0 % (ref 0.0–0.2)

## 2017-12-19 LAB — URINALYSIS, ROUTINE W REFLEX MICROSCOPIC
Bilirubin Urine: NEGATIVE
Glucose, UA: NEGATIVE mg/dL
Ketones, ur: NEGATIVE mg/dL
Leukocytes, UA: NEGATIVE
Nitrite: NEGATIVE
Protein, ur: 30 mg/dL — AB
Specific Gravity, Urine: 1.017 (ref 1.005–1.030)
pH: 6 (ref 5.0–8.0)

## 2017-12-19 LAB — BASIC METABOLIC PANEL
Anion gap: 7 (ref 5–15)
BUN: 16 mg/dL (ref 8–23)
CO2: 20 mmol/L — ABNORMAL LOW (ref 22–32)
Calcium: 6.4 mg/dL — CL (ref 8.9–10.3)
Chloride: 110 mmol/L (ref 98–111)
Creatinine, Ser: 0.92 mg/dL (ref 0.44–1.00)
GFR calc Af Amer: >60 mL/min (ref >60)
GFR calc non Af Amer: >60 mL/min (ref >60)
Glucose, Bld: 97 mg/dL (ref 70–99)
Potassium: 3.7 mmol/L (ref 3.5–5.1)
Sodium: 137 mmol/L (ref 135–145)

## 2017-12-19 LAB — C DIFFICILE QUICK SCREEN W PCR REFLEX
C Diff antigen: NEGATIVE
C Diff interpretation: NOT DETECTED
C Diff toxin: NEGATIVE

## 2017-12-19 LAB — PHOSPHORUS: Phosphorus: 2.8 mg/dL (ref 2.5–4.6)

## 2017-12-19 LAB — PROCALCITONIN: Procalcitonin: 4.22 ng/mL

## 2017-12-19 MED ORDER — SACCHAROMYCES BOULARDII 250 MG PO CAPS
250.0000 mg | ORAL_CAPSULE | Freq: Two times a day (BID) | ORAL | Status: DC
  Administered 2017-12-19 – 2017-12-24 (×10): 250 mg via ORAL
  Filled 2017-12-19 (×10): qty 1

## 2017-12-19 MED ORDER — SALINE SPRAY 0.65 % NA SOLN
1.0000 | NASAL | Status: DC | PRN
  Administered 2017-12-19: 1 via NASAL
  Filled 2017-12-19: qty 44

## 2017-12-19 MED ORDER — CLINDAMYCIN HCL 300 MG PO CAPS
300.0000 mg | ORAL_CAPSULE | Freq: Three times a day (TID) | ORAL | Status: DC
  Administered 2017-12-20: 300 mg via ORAL
  Filled 2017-12-19 (×2): qty 1

## 2017-12-19 NOTE — Progress Notes (Signed)
Occupational Therapy Treatment Patient Details Name: Ebony Scott MRN: [id number] DOB: 30-Mar-1950 Today's Date: 12/19/2017    History of present illness PT is a 67 y.o. female with a hx of anxiety, depression, asthma, breast cancer, chronic back pain, chronic neck pain, DVT, fibromyalgia, and Graves' disease, who was found down by her daughter confused and soiled w/ vomit and feces on 12/13/2017. Last know to be well time 12/11/2017. CT negative for intracranial pathology.  Found with R LE DVT and small B PE, SIRS and low grade rhabdomyolysis per internal medicine.    OT comments  Session limited by diarrhea. Pt agreeable to OT then needed to have BM.  Pt requested bed pan due to urgency.  Pt did agree to BUE exercise but needed to stay on bed pan for extended time.    Follow Up Recommendations  CIR    Equipment Recommendations  Other (comment)(TBD at next venue of care)    Recommendations for Other Services Rehab consult    Precautions / Restrictions Precautions Precautions: Fall Precaution Comments:  Hesitant to move/WB LLE Restrictions Weight Bearing Restrictions: No       Mobility Bed Mobility Overal bed mobility: Needs Assistance Bed Mobility: Rolling Rolling: Mod assist(rolling to get on bed pan)          T               ADL either performed or assessed with clinical judgement   ADL Overall ADL's : Needs assistance/impaired                                       General ADL Comments: Upon OT entering room pt needed to have a BM. Encouraged pt to try [facility] but pt wanted bed pan due to fear of having an accident.  Pt mod A for rolling in bed to left for bed pan placement.               Cognition Arousal/Alertness: Awake/alert Behavior During Therapy: WFL for tasks assessed/performed Overall Cognitive Status: Within Functional Limits for tasks assessed                                          Exercises General Exercises  - Upper Extremity Shoulder Flexion: AROM;20 reps;Supine(HOB raised.  Performed while pt on bed pan) Elbow Flexion: AROM;20 reps Elbow Extension: AROM;20 reps;Supine   Shoulder Instructions       General Comments      Pertinent Vitals/ Pain       Pain Score: 4  Faces Pain Scale: Hurts little more Pain Location: L foot Pain Descriptors / Indicators: Discomfort;Sore Pain Intervention(s): Limited activity within patient's tolerance;Monitored during session     Prior Functioning/Environment              Frequency  Min 2X/week        Progress Toward Goals  OT Goals(current goals can now be found in the care plan section)  Progress towards OT goals: Progressing toward goals     Plan Discharge plan remains appropriate       AM-PAC PT "6 Clicks" Daily Activity     Outcome Measure   Help from another person eating meals?: A Little Help from another person taking care of personal grooming?: A Little Help from another person toileting, which  includes using toliet, bedpan, or urinal?: A Lot Help from another person bathing (including washing, rinsing, drying)?: A Lot Help from another person to put on and taking off regular upper body clothing?: A Little Help from another person to put on and taking off regular lower body clothing?: Total 6 Click Score: 14    End of Session    OT Visit Diagnosis: Other abnormalities of gait and mobility (R26.89);Muscle weakness (generalized) (M62.81);Pain Pain - Right/Left: (bil) Pain - part of body: Knee;Leg   Activity Tolerance Other (comment)(limited by diarrhea)   Patient Left with call bell/phone within reach;in bed(on bedpan- CNA aware)   Nurse Communication Mobility status(right hand weakness and pain)        Time: 6789-3810 OT Time Calculation (min): 10 min  Charges: OT General Charges $OT Visit: 1 Visit OT Treatments $Self Care/Home Management : 8-22 mins  Kari Baars, [place] [phone] Office- [place], Edwena Felty D 12/19/2017, 4:26 PM

## 2017-12-19 NOTE — Progress Notes (Signed)
CRITICAL VALUE ALERT  Critical Value:  Calcium 6.4  Date & Time Notied:  12/19/17 @ 1116  Provider Notified: Dr Karleen Hampshire @ 1120  Orders Received/Actions taken: Will await any new orders

## 2017-12-19 NOTE — Progress Notes (Addendum)
PROGRESS NOTE    Ebony Scott  GYB:[id number] DOB: 1950-07-24 DOA: 12/13/2017 PCP: [name], MD    Brief Narrative:  67 y.o.femalewith a hx of anxiety, depression, asthma, breast cancer, chronic back pain, chronic neck pain, diverticulosis, DVT, fibromyalgia, GERD, Graves' disease, hyperlipidemia, IBS, and osteoporosis who was found down by her daughter confused and soiled w/ vomit and feces. She does not remember what happened or having any symptoms prior to losing consciousness. In the ED her vitals were unremarkable and a CXR did not show any significant abnormality. CT head did not show any acute intracranial pathology. Bilateral lower extremities duplex showed RLE DVT.  CT angiogram of the chest shows bilateral small pulmonary embolism.    Assessment & Plan:   Principal Problem: Acute bilateral PE Active Problems:   Hyperthyroidism   Hyperlipidemia   GERD   Pseudogout   Chronic diastolic CHF (congestive heart failure) (HCC)   Rhabdomyolysis   Acute deep vein thrombosis (DVT) of right lower extremity (HCC)   Acute bilateral PE and acute right lower extremity DVT She was started on IV heparin and transition to oral Xarelto. She denies any sob or chest pain and has good sats on RA.    SIRS, sepsis ruled out  probably from rhabdomyolysis and acute pulmonary embolism.   Acute rhabdomyolysis; Improving CK, .  Renal function and urine output continues to improve with IV fluids, Pain control with oral tramadol and tylenol,. PT recommending CIR on discharge.     Chronic diastolic heart failure  CXR shows small bilateral pleural effusions, with atelectasis, she does not appear to be fluid overloaded.  Monitor.    Hyperlipidemia holding statin due to acute rhabdomyolysis at this time.   History of hyper parathyroidism TSH is around 5,  patient's PCP recommended holding methimazole for 1 month from October 17.   GERD Continue with Protonix.   Severe left  ankle pain and swelling,  Differential include, fracture vs gout vs pseudo gout flare up.   x rays of the left ankle , done showed soft tissue swelling and chondrocalcinosis.  She was empirically started on colchicine, with minimal improvement in the ankle pain, prednisone added to the regimen.  Pt reports her pain is better and is able to participate with therapy.  PT eval recommending CIR.  Consulted.     Severe Hypophosphatemia:Moderate malnutrition : Repleted, repeat levels are pending.   Nutrition will be consulted, will need to watch for refeeding syndrome.   Hypoalbuminemia:  Nutrition consulted for protein supplementation   Moderate to severe generalized weakness:  probably from laying on the floor from wednesday till Friday, continuous rhabdomyolysis and severe hypophosphatemia.  Improving, PT evaluation recommending CIR.     Hypocalcemia: corrected calcium level greater than 7.5.   Elevated liver function panel: US liver shows Surgically absent gallbladder with no evidence of bile duct obstruction.   Iron deficiency anemia:  Iron supplementation will be added.    Leukocytosis, fever  and left ankle swelling.  Suspect from gout, with superimposed cellulitis. . X rays show soft tissue swelling,  Added IV clindamycin for cellulitis . Orthopedics consulted and she underwent aspiration of the ankle joint, showing crystals and only 11,500, possibly pseudo vs gout flare up. Prednisone added to the regimen in addition to colchicine.  Day 3 of clindamycin, improving. Plan for a tleast 7 days of antibiotics.     DVT prophylaxis: Heparin transitioned to Xarelto Code Status: Full code Family Communication: discussed with daughter over the phone  on 10/29 .called daughter on 10/30, 10/31, but couldn't  reach daughter .  disposition Plan: Pending clinical improvement, pending CIR bed   Consultants:  Orthopedics.  CIR   Procedures:  Left ankle aspiration. BY Dr Lorin Mercy.     Antimicrobials: clindamycin for cellulitis    Subjective: Pt reports the  Ankle pain is better. No fever this am.    Objective: Vitals:   12/18/17 1931 12/18/17 2205 12/19/17 0545 12/19/17 0757  BP:  (!) 131/59 105/82   Pulse:  92 86   Resp:      Temp:  (!) 100.5 F (38.1 C) 98.2 F (36.8 C)   TempSrc:      SpO2: 98% 94% 95% 95%  Weight:      Height:        Intake/Output Summary (Last 24 hours) at 12/19/2017 0931 Last data filed at 12/19/2017 0300 Gross per 24 hour  Intake 1130 ml  Output 1600 ml  Net -470 ml   Filed Weights   12/13/17 1026 12/14/17 0207  Weight: 61.2 kg 60.4 kg    Examination:  General exam: alert and comfortable ,not in distress.  Respiratory system:clear to auscultation, no wheezing or rhonchi.  Cardiovascular system: S1 & S2 heard, RRR. No JVD , bilateral ankle edema.  Gastrointestinal system: Abdomen is soft NT ND BS+ Central nervous system: alert, oriented and answering questions appropriately.  Extremities: decreased strength in bilateral lower extremities.left ankle swelling more than rigth ankle swelling. Tenderness improving.  Skin: No rashes, lesions or ulcers Psychiatry:  mood appropriate.     Data Reviewed: I have personally reviewed following labs and imaging studies  CBC: Recent Labs  Lab 12/13/17 1051  12/14/17 0434 12/15/17 0342 12/16/17 0414 12/17/17 0530 12/17/17 2052 12/18/17 0256 12/19/17 0356  WBC 13.4*  --  12.8* 10.4 12.5* 17.3*  --  14.7* 13.2*  NEUTROABS 10.2*  --  10.1*  --   --   --   --   --   --   HGB 14.5   < > 12.5 10.7* 10.0* 10.4* 10.3* 10.0* 9.3*  HCT 44.3   < > 39.3 32.3* 31.7* 32.1* 32.1* 30.0* 27.8*  MCV 99.3  --  100.0 98.2 100.3* 99.4  --  96.5 95.2  PLT 201  --  203 201 215 263  --  285 327   < > = values in this interval not displayed.   Basic Metabolic Panel: Recent Labs  Lab 12/13/17 1849 12/14/17 0434 12/15/17 0342 12/16/17 0414 12/17/17 0530 12/18/17 1106  NA  --  141 138  137 139 136  K  --  3.7 3.3* 4.8 4.0 3.6  CL  --  105 109 111 111 111  CO2  --  21* 21* 21* 20* 19*  GLUCOSE  --  103* 98 91 96 121*  BUN  --  24* 20 17 13 16   CREATININE  --  1.19* 1.07* 1.01* 1.00 0.93  CALCIUM  --  8.0* 7.1* 6.9* 6.5* 6.4*  MG 2.3  --   --   --  1.7 1.7  PHOS 2.1*  --   --  1.2* 2.3* 2.2*   GFR: Estimated Creatinine Clearance: 47.6 mL/min (by C-G formula based on SCr of 0.93 mg/dL). Liver Function Tests: Recent Labs  Lab 12/13/17 1051 12/15/17 0342 12/18/17 1106  AST 87* 85* 203*  ALT 55* 61* 122*  ALKPHOS 72 58 76  BILITOT 2.3* 0.9 0.7  PROT 6.7 5.2* 5.2*  ALBUMIN 2.8* 2.0* 1.4*  No results for input(s): LIPASE, AMYLASE in the last 168 hours. No results for input(s): AMMONIA in the last 168 hours. Coagulation Profile: No results for input(s): INR, PROTIME in the last 168 hours. Cardiac Enzymes: Recent Labs  Lab 12/13/17 1051 12/14/17 0434 12/15/17 0342 12/16/17 0414 12/17/17 0530  CKTOTAL 2,446* 1,465* 559* 327* 162   BNP (last 3 results) No results for input(s): PROBNP in the last 8760 hours. HbA1C: No results for input(s): HGBA1C in the last 72 hours. CBG: No results for input(s): GLUCAP in the last 168 hours. Lipid Profile: No results for input(s): CHOL, HDL, LDLCALC, TRIG, CHOLHDL, LDLDIRECT in the last 72 hours. Thyroid Function Tests: No results for input(s): TSH, T4TOTAL, FREET4, T3FREE, THYROIDAB in the last 72 hours. Anemia Panel: Recent Labs    12/16/17 0937 12/16/17 0953  VITAMINB12  --  3,282*  FOLATE  --  23.3  FERRITIN  --  712*  TIBC  --  140*  IRON  --  14*  RETICCTPCT 0.9  --    Sepsis Labs: Recent Labs  Lab 12/13/17 1059 12/13/17 1629 12/17/17 1415 12/18/17 0256 12/19/17 0356  PROCALCITON  --   --  6.03 6.25 4.22  LATICACIDVEN 2.28* 1.52 1.0  --   --     Recent Results (from the past 240 hour(s))  Blood Culture (routine x 2)     Status: None   Collection Time: 12/13/17 11:38 AM  Result Value Ref Range  Status   Specimen Description BLOOD RIGHT ANTECUBITAL  Final   Special Requests   Final    BOTTLES DRAWN AEROBIC AND ANAEROBIC Blood Culture results may not be optimal due to an excessive volume of blood received in culture bottles   Culture   Final    NO GROWTH 5 DAYS Performed at [facility], [place] [address], [place], [place] [address]    Report Status 12/18/2017 FINAL  Final  Blood Culture (routine x 2)     Status: None   Collection Time: 12/13/17 11:53 AM  Result Value Ref Range Status   Specimen Description BLOOD BLOOD RIGHT HAND  Final   Special Requests   Final    BOTTLES DRAWN AEROBIC AND ANAEROBIC Blood Culture adequate volume   Culture   Final    NO GROWTH 5 DAYS Performed at [facility], [place] [address], [place], [place] [address]    Report Status 12/18/2017 FINAL  Final  Urine culture     Status: None   Collection Time: 12/13/17  2:41 PM  Result Value Ref Range Status   Specimen Description URINE, RANDOM  Final   Special Requests NONE  Final   Culture   Final    NO GROWTH Performed at [facility], [place] [address], [place], [place] [address]    Report Status 12/14/2017 FINAL  Final  MRSA PCR Screening     Status: None   Collection Time: 12/14/17  2:45 AM  Result Value Ref Range Status   MRSA by PCR NEGATIVE NEGATIVE Final    Comment:        The GeneXpert MRSA Assay (FDA approved for NASAL specimens only), is one component of a comprehensive MRSA colonization surveillance program. It is not intended to diagnose MRSA infection nor to guide or monitor treatment for MRSA infections. Performed at [facility], [place] [address] [place], [place] [address]   Culture, blood (Routine X 2) w Reflex to ID Panel     Status: None (  Preliminary result)   Collection Time: 12/17/17  2:20 PM  Result Value Ref Range Status   Specimen Description BLOOD LEFT ANTECUBITAL  Final   Special Requests   Final    BOTTLES DRAWN AEROBIC ONLY Blood Culture  adequate volume   Culture   Final    NO GROWTH < 24 HOURS Performed at [facility], [address], [place], [place] [address]    Report Status PENDING  Incomplete  Culture, blood (Routine X 2) w Reflex to ID Panel     Status: None (Preliminary result)   Collection Time: 12/17/17  2:26 PM  Result Value Ref Range Status   Specimen Description BLOOD LEFT HAND  Final   Special Requests   Final    BOTTLES DRAWN AEROBIC ONLY Blood Culture adequate volume   Culture   Final    NO GROWTH < 24 HOURS Performed at [facility], [place] [address], [place], [place] [address]    Report Status PENDING  Incomplete  Body fluid culture     Status: None (Preliminary result)   Collection Time: 12/17/17  3:10 PM  Result Value Ref Range Status   Specimen Description SYNOVIAL  Final   Special Requests NONE  Final   Gram Stain   Final    FEW WBC PRESENT,BOTH PMN AND MONONUCLEAR NO ORGANISMS SEEN    Culture   Final    NO GROWTH < 24 HOURS Performed at [facility], [place] [address], [place], [place] [address]    Report Status PENDING  Incomplete  C difficile quick scan w PCR reflex     Status: None   Collection Time: 12/19/17 12:30 AM  Result Value Ref Range Status   C Diff antigen NEGATIVE NEGATIVE Final   C Diff toxin NEGATIVE NEGATIVE Final   C Diff interpretation No C. difficile detected.  Final    Comment: Performed at [facility], [place] [address] [place], [place] [address]         Radiology Studies: Dg Chest 2 View  Result Date: 12/17/2017 CLINICAL DATA:  Hypoxia. EXAM: CHEST - 2 VIEW COMPARISON:  Radiograph December 13, 2017. FINDINGS: The heart size and mediastinal contours are within normal limits. Minimal bibasilar subsegmental atelectasis is noted with small pleural effusions. No pneumothorax is noted. The visualized skeletal structures are unremarkable. IMPRESSION: Minimal bibasilar subsegmental atelectasis with small pleural effusions. Electronically Signed    By: Marijo Conception, M.D.   On: 12/17/2017 11:25   US Pelvis Limited (transabdominal Only)  Result Date: 12/17/2017 CLINICAL DATA:  Vaginal bleeding. EXAM: TRANSABDOMINAL ULTRASOUND OF PELVIS TECHNIQUE: Transabdominal ultrasound examination of the pelvis was performed including evaluation of the uterus, ovaries, adnexal regions, and pelvic cul-de-sac. COMPARISON:  None. FINDINGS: Uterus Previous hysterectomy. Endometrium Previous hysterectomy. Right ovary Not visualized. Left ovary Not visualized. Other findings: No abnormal free fluid. Possible echogenic debris within the dependent portion of the bladder which could be seen with cystitis. IMPRESSION: Prior hysterectomy. Nonvisualization of the ovaries. No free pelvic fluid. Possible echogenic debris within the dependent portion of the bladder which could be seen with cystitis. Electronically Signed   By: Marin Olp M.D.   On: 12/17/2017 23:46        Scheduled Meds: . calcium carbonate  1 tablet Oral BID WC  . colchicine  0.6 mg Oral BID  . feeding supplement (ENSURE ENLIVE)  237 mL Oral TID WC  . ferrous sulfate  325 mg Oral Q breakfast  .  gabapentin  600 mg Oral QHS  . lactase  6,000 Units Oral TID WC  . mirtazapine  30 mg Oral QHS  . mometasone-formoterol  1 puff Inhalation BID  . montelukast  10 mg Oral Daily  . pantoprazole  40 mg Oral Daily  . potassium & sodium phosphates  1 packet Oral TID WC & HS  . predniSONE  60 mg Oral QAC breakfast  . rivaroxaban  15 mg Oral BID WC   Followed by  . [START ON 01/05/2018] rivaroxaban  20 mg Oral Q supper  . umeclidinium bromide  1 puff Inhalation Daily   Continuous Infusions: . clindamycin (CLEOCIN) IV 600 mg (12/19/17 0852)     LOS: 6 days    Time spent: 28 minutes.     Hosie Poisson, MD [facility] Pager [phone]   If 7PM-7AM, please contact night-coverage [email or web address] Password [email or web address] 12/19/2017, 9:31 AM

## 2017-12-19 NOTE — Progress Notes (Signed)
IP rehab admissions - I met with patient at the bedside.  She would like CIR.  I will open the case with insurance carrier and request acute inpatient rehab.  I will follow up after I hear back from insurance case manager.  Call me for questions.  [phone]

## 2017-12-20 LAB — URINE CULTURE

## 2017-12-20 LAB — BODY FLUID CULTURE: Culture: NO GROWTH

## 2017-12-20 LAB — URIC ACID: Uric Acid, Serum: 3.7 mg/dL (ref 2.5–7.1)

## 2017-12-20 MED ORDER — COLCHICINE 0.6 MG PO TABS
0.6000 mg | ORAL_TABLET | Freq: Every day | ORAL | Status: DC
  Administered 2017-12-21 – 2017-12-23 (×3): 0.6 mg via ORAL
  Filled 2017-12-20 (×4): qty 1

## 2017-12-20 MED ORDER — PRO-STAT SUGAR FREE PO LIQD
30.0000 mL | Freq: Three times a day (TID) | ORAL | Status: DC
  Administered 2017-12-20 – 2017-12-24 (×14): 30 mL via ORAL
  Filled 2017-12-20 (×13): qty 30

## 2017-12-20 MED ORDER — CALCIUM GLUCONATE-NACL 1-0.675 GM/50ML-% IV SOLN
1.0000 g | Freq: Once | INTRAVENOUS | Status: AC
  Administered 2017-12-20: 1000 mg via INTRAVENOUS
  Filled 2017-12-20: qty 50

## 2017-12-20 NOTE — Care Management Important Message (Signed)
Important Message  Patient Details  Name: Ebony Scott MRN: [id number] Date of Birth: 1951/02/03   Medicare Important Message Given:  Yes    Dalores Weger P Aviah Sorci 12/20/2017, 4:08 PM

## 2017-12-20 NOTE — Progress Notes (Addendum)
Physical Therapy Treatment Patient Details Name: Ebony Scott MRN: [id number] DOB: 1950/08/24 Today's Date: 12/20/2017    History of Present Illness PT is a 67 y.o. female with a hx of anxiety, depression, asthma, breast cancer, chronic back pain, chronic neck pain, DVT, fibromyalgia, and Graves' disease, who was found down by her daughter confused and soiled w/ vomit and feces on 12/13/2017. Last know to be well time 12/11/2017. CT negative for intracranial pathology.  Found with R LE DVT and small B PE, SIRS and low grade rhabdomyolysis per internal medicine.     PT Comments    Before PT arrival, nursing transferred pt to chair requiring Max A + 2 upon pt request. Pt seemed motivated and willing to participate in therapy today stating "I ready to get some strength back in my legs". Pt stated she had diarrhea for the past couple of days, and still has an upset stomach. Therapists transferred pt to bedside commode with Max A +2 to prevent pt LOB. Pt required max VC/TC to progress RW and for safety. Pt had diarrhea once on commode and stated she felt better after.  Pt participated in seated/reclined ex's once in chair. HEP was given to pt at end of session. Pt is making progress toward stated goals and would benefit from PT in order to maximize functional independence to be d/c to CIR.    Follow Up Recommendations  CIR     Equipment Recommendations  Rolling walker with 5" wheels    Recommendations for Other Services Rehab consult     Precautions / Restrictions Precautions Precautions: Fall Precaution Comments:  Hesitant to move/WB LLE Restrictions Weight Bearing Restrictions: No    Mobility  Bed Mobility                  Transfers Overall transfer level: Needs assistance Equipment used: Rolling walker (2 wheeled) Transfers: Sit to/from [employer] Transfers Sit to Stand: Max assist;+2 physical assistance Stand pivot transfers: Max assist;+2 physical assistance        General transfer comment: Max A + 2 to stand from chair and take steps toward bedside commode. VC/TC required for hand placement and progressing RW.  Ambulation/Gait Ambulation/Gait assistance: Max assist;+2 physical assistance   Assistive device: Rolling walker (2 wheeled) Gait Pattern/deviations: Step-to pattern;Decreased stride length;Narrow base of support;Trunk flexed;Decreased step length - left;Decreased stance time - right;Decreased step length - right;Decreased stance time - left;Decreased dorsiflexion - right     General Gait Details: Steps to and from [facility] 22ft x 2 assist to move RW.   Stairs             Wheelchair Mobility    Modified Rankin (Stroke Patients Only)       Balance Overall balance assessment: Needs assistance Sitting-balance support: Bilateral upper extremity supported Sitting balance-Leahy Scale: Poor       Standing balance-Leahy Scale: Poor Standing balance comment: Max A +2 to maintain balance with RW                            Cognition Arousal/Alertness: Awake/alert Behavior During Therapy: WFL for tasks assessed/performed Overall Cognitive Status: Within Functional Limits for tasks assessed                                        Exercises Total Joint Exercises Ankle Circles/Pumps: AROM;20 reps;Right;Left Quad  Sets: AROM;10 reps;Right;Left Gluteal Sets: AROM;10 reps;Right;Left Heel Slides: AROM;10 reps;Right;Left    General Comments General comments (skin integrity, edema, etc.): had family present during session      Pertinent Vitals/Pain Pain Assessment: No/denies pain(Stated her stomach felt uncomfortable and needed to use the restroom)    Home Living                      Prior Function            PT Goals (current goals can now be found in the care plan section) Acute Rehab PT Goals Patient Stated Goal: To feel better.  PT Goal Formulation: With patient/family Time For Goal  Achievement: 12/29/17 Potential to Achieve Goals: Fair Progress towards PT goals: Progressing toward goals    Frequency    Min 4X/week      PT Plan Current plan remains appropriate    Co-evaluation              AM-PAC PT "6 Clicks" Daily Activity  Outcome Measure  Difficulty turning over in bed (including adjusting bedclothes, sheets and blankets)?: Unable Difficulty moving from lying on back to sitting on the side of the bed? : Unable Difficulty sitting down on and standing up from a chair with arms (e.g., wheelchair, bedside commode, etc,.)?: A Lot Help needed moving to and from a bed to chair (including a wheelchair)?: A Lot Help needed walking in hospital room?: Total Help needed climbing 3-5 steps with a railing? : Total 6 Click Score: 8    End of Session Equipment Utilized During Treatment: Gait belt Activity Tolerance: Patient limited by fatigue Patient left: in chair;with call bell/phone within reach;with chair alarm set;with family/visitor present Nurse Communication: Mobility status PT Visit Diagnosis: Unsteadiness on feet (R26.81);Other abnormalities of gait and mobility (R26.89);Muscle weakness (generalized) (M62.81);History of falling (Z91.81);Pain Pain - Right/Left: Left Pain - part of body: Ankle and joints of foot     Time: 3903-0092 PT Time Calculation (min) (ACUTE ONLY): 30 min  Charges:  $Therapeutic Exercise: 8-22 mins $Therapeutic Activity: 8-22 mins                     [address] SPTA   [place] 12/20/2017, 1:04 PM

## 2017-12-20 NOTE — Progress Notes (Signed)
PROGRESS NOTE                                                                                                                                                                                                             Patient Demographics:    Ebony Scott, is a 67 y.o. female, DOB - Jan 05, 1951, WYO:[id number]  Admit date - 12/13/2017   Admitting Physician Reubin Milan, MD  Outpatient Primary MD for the patient is [name], Cammie Mcgee, MD  LOS - 7  Chief Complaint  Patient presents with  . Fall       Brief Narrative    67 y.o.femalewitha hx ofanxiety, depression, asthma, breast cancer, chronic back pain, chronic neck pain, diverticulosis, DVT, fibromyalgia, GERD, Graves' disease, hyperlipidemia, IBS,andosteoporosis who was founddownby her daughter confusedand soiled w/vomit and feces. She does not remember what happened or having any symptoms prior to losing consciousness.   In the ED her vitals were unremarkable and a CXRdid not show any significant abnormality. CT head did not show any acute intracranial pathology. Bilateral lower extremities duplex showed RLE DVT.  CT angiogram of the chest shows bilateral small pulmonary embolism.    Subjective:    Ebony Scott today has, No headache, No chest pain, No abdominal pain - No Nausea, No new weakness tingling or numbness, No Cough - SOB.    Assessment  & Plan :     1.  Right lower extremity acute DVT and small bilateral acute PEs.  Stable on Xarelto continue.  No shortness of breath or chest pain.  2.  SIRs.  Due to #1 above.  No infection stop on antibiotics.  3.  Fall at home, was on the floor for 2 to 3 days.  Patient does not have a good recollection of what happened, had rhabdomyolysis, head CT was nonacute, she is headache free with no focal deficits but does have generalized bilateral lower extremity weakness, CK have normalized, TSH is stable, commence aggressive PT mobilization and monitor.  If  diffuse weakness in the lower extremities persist will pursue MRI T and L-spine.  Upper extremity strength is stable.  Definitely require placement due to generalized weakness and deconditioning.  4.  History of hypothyroidism.  Recently taken off of methimazole.  TSH is stable at 5.  5.  Left foot/ankle pseudogout.  Improved with steroids and colchicine.  Stable uric acid.  X-ray stable.  6.  Severe protein calorie malnutrition.  Placed on pro-stat will monitor.  7.  Hypocalcemia.  Replaced will monitor.  8.  Hypophosphatemia.  Replaced and stable.  9.  Mildly elevated LFTs.  Due to rhabdomyolysis. Right upper quadrant ultrasound stable.  Will trend and monitor.  10.  Iron deficiency anemia.  On oral iron, outpatient PCP driven age-appropriate iron deficiency anemia work-up.  11.  Dyslipidemia started on hold due to elevated LFTs.  12.  Chronic diastolic CHF.  Recent EF 65% on recent echocardiogram.  Currently compensated will monitor.  13.  GERD.  On PPI continue.    Family Communication  : Granddaughter at bedside  Code Status : Full  Disposition Plan  : MedSurg  Consults  : None  Procedures  :    CT angiogram chest.  Showing small PE.  DVT Prophylaxis  : Xarelto  Lab Results  Component Value Date   PLT 327 12/19/2017    Diet :  Diet Order            Diet regular Room service appropriate? Yes; Fluid consistency: Thin  Diet effective now               Inpatient Medications Scheduled Meds: . calcium carbonate  1 tablet Oral BID WC  . clindamycin  300 mg Oral Q8H  . colchicine  0.6 mg Oral BID  . feeding supplement (ENSURE ENLIVE)  237 mL Oral TID WC  . feeding supplement (PRO-STAT SUGAR FREE 64)  30 mL Oral TID WC  . ferrous sulfate  325 mg Oral Q breakfast  . gabapentin  600 mg Oral QHS  . lactase  6,000 Units Oral TID WC  . mirtazapine  30 mg Oral QHS  . mometasone-formoterol  1 puff Inhalation BID  . montelukast  10 mg Oral Daily  . pantoprazole   40 mg Oral Daily  . potassium & sodium phosphates  1 packet Oral TID WC & HS  . predniSONE  60 mg Oral QAC breakfast  . rivaroxaban  15 mg Oral BID WC   Followed by  . [START ON 01/05/2018] rivaroxaban  20 mg Oral Q supper  . saccharomyces boulardii  250 mg Oral BID  . umeclidinium bromide  1 puff Inhalation Daily   Continuous Infusions: . calcium gluconate     PRN Meds:.acetaminophen, [DISCONTINUED] ondansetron **OR** ondansetron (ZOFRAN) IV, simethicone, sodium chloride, traMADol  Antibiotics  :   Anti-infectives (From admission, onward)   Start     Dose/Rate Route Frequency Ordered Stop   12/20/17 0600  clindamycin (CLEOCIN) capsule 300 mg     300 mg Oral Every 8 hours 12/19/17 1954     12/17/17 0930  clindamycin (CLEOCIN) IVPB 600 mg  Status:  Discontinued     600 mg 100 mL/hr over 30 Minutes Intravenous Every 8 hours 12/17/17 0929 12/19/17 1952   12/17/17 0915  ceFAZolin (ANCEF) IVPB 1 g/50 mL premix  Status:  Discontinued     1 g 100 mL/hr over 30 Minutes Intravenous Every 8 hours 12/17/17 0912 12/17/17 0928   12/14/17 1300  cefTRIAXone (ROCEPHIN) 1 g in sodium chloride 0.9 % 100 mL IVPB  Status:  Discontinued     1 g 200 mL/hr over 30 Minutes Intravenous Every 24 hours 12/13/17 1701 12/14/17 1639   12/13/17 2200  metroNIDAZOLE (FLAGYL) IVPB 500 mg  Status:  Discontinued     500 mg 100 mL/hr over 60 Minutes Intravenous Every 8 hours 12/13/17 1701 12/14/17 1639   12/13/17 1315  cefTRIAXone (ROCEPHIN) 2 g in sodium chloride 0.9 % 100 mL IVPB  2 g 200 mL/hr over 30 Minutes Intravenous  Once 12/13/17 1304 12/13/17 1412   12/13/17 1315  metroNIDAZOLE (FLAGYL) IVPB 500 mg     500 mg 100 mL/hr over 60 Minutes Intravenous  Once 12/13/17 1304 12/13/17 1512   12/13/17 1130  cefTRIAXone (ROCEPHIN) 2 g in sodium chloride 0.9 % 100 mL IVPB  Status:  Discontinued     2 g 200 mL/hr over 30 Minutes Intravenous Every 24 hours 12/13/17 1126 12/13/17 1204   12/13/17 1130  azithromycin  (ZITHROMAX) 500 mg in sodium chloride 0.9 % 250 mL IVPB  Status:  Discontinued     500 mg 250 mL/hr over 60 Minutes Intravenous Every 24 hours 12/13/17 1126 12/13/17 1204          Objective:   Vitals:   12/19/17 2200 12/20/17 0527 12/20/17 0801 12/20/17 0802  BP:  (!) 141/69    Pulse:  80    Resp:  17    Temp:  98.1 F (36.7 C)    TempSrc:  Oral    SpO2: 96% 94% 94% 94%  Weight:      Height:        Wt Readings from Last 3 Encounters:  12/14/17 60.4 kg  12/05/17 61.2 kg  11/27/17 61.1 kg    No intake or output data in the 24 hours ending 12/20/17 1158   Physical Exam  Awake Alert, Oriented X 3, No new F.N deficits, she is extremely weak in both lower extremity strength is 4/5, deep tendon reflexes are diminished, normal affect [place].AT,PERRAL Supple Neck,No JVD, No cervical lymphadenopathy appriciated.  Symmetrical Chest wall movement, Good air movement bilaterally, CTAB RRR,No Gallops,Rubs or new Murmurs, No Parasternal Heave +ve B.Sounds, Abd Soft, No tenderness, No organomegaly appriciated, No rebound - guarding or rigidity. No Cyanosis, Clubbing or edema, No new Rash or bruise       Data Review:    CBC Recent Labs  Lab 12/14/17 0434 12/15/17 0342 12/16/17 0414 12/17/17 0530 12/17/17 2052 12/18/17 0256 12/19/17 0356  WBC 12.8* 10.4 12.5* 17.3*  --  14.7* 13.2*  HGB 12.5 10.7* 10.0* 10.4* 10.3* 10.0* 9.3*  HCT 39.3 32.3* 31.7* 32.1* 32.1* 30.0* 27.8*  PLT 203 201 215 263  --  285 327  MCV 100.0 98.2 100.3* 99.4  --  96.5 95.2  MCH 31.8 32.5 31.6 32.2  --  32.2 31.8  MCHC 31.8 33.1 31.5 32.4  --  33.3 33.5  RDW 13.1 13.4 14.3 14.8  --  15.1 15.1  LYMPHSABS 0.5*  --   --   --   --   --   --   MONOABS 1.7*  --   --   --   --   --   --   EOSABS 0.0  --   --   --   --   --   --   BASOSABS 0.0  --   --   --   --   --   --     Chemistries  Recent Labs  Lab 12/13/17 1849  12/15/17 0342 12/16/17 0414 12/17/17 0530 12/18/17 1106 12/19/17 0944  NA  --     < > 138 137 139 136 137  K  --    < > 3.3* 4.8 4.0 3.6 3.7  CL  --    < > 109 111 111 111 110  CO2  --    < > 21* 21* 20* 19* 20*  GLUCOSE  --    < >  98 91 96 121* 97  BUN  --    < > 20 17 13 16 16   CREATININE  --    < > 1.07* 1.01* 1.00 0.93 0.92  CALCIUM  --    < > 7.1* 6.9* 6.5* 6.4* 6.4*  MG 2.3  --   --   --  1.7 1.7  --   AST  --   --  85*  --   --  203*  --   ALT  --   --  61*  --   --  122*  --   ALKPHOS  --   --  58  --   --  76  --   BILITOT  --   --  0.9  --   --  0.7  --    < > = values in this interval not displayed.   ------------------------------------------------------------------------------------------------------------------ No results for input(s): CHOL, HDL, LDLCALC, TRIG, CHOLHDL, LDLDIRECT in the last 72 hours.  No results found for: HGBA1C ------------------------------------------------------------------------------------------------------------------ No results for input(s): TSH, T4TOTAL, T3FREE, THYROIDAB in the last 72 hours.  Invalid input(s): FREET3 ------------------------------------------------------------------------------------------------------------------ No results for input(s): VITAMINB12, FOLATE, FERRITIN, TIBC, IRON, RETICCTPCT in the last 72 hours.  Coagulation profile No results for input(s): INR, PROTIME in the last 168 hours.  No results for input(s): DDIMER in the last 72 hours.  Cardiac Enzymes No results for input(s): CKMB, TROPONINI, MYOGLOBIN in the last 168 hours.  Invalid input(s): CK ------------------------------------------------------------------------------------------------------------------    Component Value Date/Time   BNP 60.6 12/13/2017 1051    Micro Results Recent Results (from the past 240 hour(s))  Blood Culture (routine x 2)     Status: None   Collection Time: 12/13/17 11:38 AM  Result Value Ref Range Status   Specimen Description BLOOD RIGHT ANTECUBITAL  Final   Special Requests   Final    BOTTLES  DRAWN AEROBIC AND ANAEROBIC Blood Culture results may not be optimal due to an excessive volume of blood received in culture bottles   Culture   Final    NO GROWTH 5 DAYS Performed at [facility], [place] [address], [place], [place] [address]    Report Status 12/18/2017 FINAL  Final  Blood Culture (routine x 2)     Status: None   Collection Time: 12/13/17 11:53 AM  Result Value Ref Range Status   Specimen Description BLOOD BLOOD RIGHT HAND  Final   Special Requests   Final    BOTTLES DRAWN AEROBIC AND ANAEROBIC Blood Culture adequate volume   Culture   Final    NO GROWTH 5 DAYS Performed at [facility], [place] [address], [place], [place] [address]    Report Status 12/18/2017 FINAL  Final  Urine culture     Status: None   Collection Time: 12/13/17  2:41 PM  Result Value Ref Range Status   Specimen Description URINE, RANDOM  Final   Special Requests NONE  Final   Culture   Final    NO GROWTH Performed at [facility], [place] [address], [place], [place] [address]    Report Status 12/14/2017 FINAL  Final  MRSA PCR Screening     Status: None   Collection Time: 12/14/17  2:45 AM  Result Value Ref Range Status   MRSA by PCR NEGATIVE NEGATIVE Final    Comment:        The GeneXpert MRSA Assay (FDA approved for NASAL specimens only), is one component of a comprehensive  MRSA colonization surveillance program. It is not intended to diagnose MRSA infection nor to guide or monitor treatment for MRSA infections. Performed at [facility], [place] [address], [place], [place] [address]   Culture, blood (Routine X 2) w Reflex to ID Panel     Status: None (Preliminary result)   Collection Time: 12/17/17  2:20 PM  Result Value Ref Range Status   Specimen Description BLOOD LEFT ANTECUBITAL  Final   Special Requests   Final    BOTTLES DRAWN AEROBIC ONLY Blood Culture adequate volume   Culture   Final    NO GROWTH 3 DAYS Performed at [facility], [place] [address], [place], [place] [address]    Report Status PENDING  Incomplete  Culture, blood (Routine X 2) w Reflex to ID Panel     Status: None (Preliminary result)   Collection Time: 12/17/17  2:26 PM  Result Value Ref Range Status   Specimen Description BLOOD LEFT HAND  Final   Special Requests   Final    BOTTLES DRAWN AEROBIC ONLY Blood Culture adequate volume   Culture   Final    NO GROWTH 3 DAYS Performed at [facility], [place] [address] [place], [place] [address]    Report Status PENDING  Incomplete  Body fluid culture     Status: None (Preliminary result)   Collection Time: 12/17/17  3:10 PM  Result Value Ref Range Status   Specimen Description SYNOVIAL  Final   Special Requests NONE  Final   Gram Stain   Final    FEW WBC PRESENT,BOTH PMN AND MONONUCLEAR NO ORGANISMS SEEN    Culture   Final    NO GROWTH 2 DAYS Performed at [facility], [place] [address], [place], [place] [address]    Report Status PENDING  Incomplete  Culture, Urine     Status: Abnormal   Collection Time: 12/18/17  8:58 AM  Result Value Ref Range Status   Specimen Description URINE, RANDOM  Final   Special Requests   Final    NONE Performed at [facility], [place] [address], [place], [place] [address]    Culture MULTIPLE SPECIES PRESENT, SUGGEST RECOLLECTION (A)  Final   Report Status 12/20/2017 FINAL  Final  C difficile quick scan w PCR reflex     Status: None   Collection Time: 12/19/17 12:30 AM  Result Value Ref Range Status   C Diff antigen NEGATIVE NEGATIVE Final   C Diff toxin NEGATIVE NEGATIVE Final   C Diff interpretation No C. difficile detected.  Final    Comment: Performed at [facility], [place] [address], [place], [place] [address]    Radiology Reports Dg Chest 2 View  Result Date: 12/17/2017 CLINICAL DATA:  Hypoxia. EXAM: CHEST - 2 VIEW COMPARISON:  Radiograph December 13, 2017. FINDINGS: The heart size and mediastinal contours are within normal limits. Minimal bibasilar  subsegmental atelectasis is noted with small pleural effusions. No pneumothorax is noted. The visualized skeletal structures are unremarkable. IMPRESSION: Minimal bibasilar subsegmental atelectasis with small pleural effusions. Electronically Signed   By: Marijo Conception, M.D.   On: 12/17/2017 11:25   Dg Ankle Complete Left  Result Date: 12/16/2017 CLINICAL DATA:  Pain EXAM: LEFT ANKLE COMPLETE - 3+ VIEW COMPARISON:  None. FINDINGS: Frontal, oblique, and lateral views obtained. There is generalized soft tissue swelling. There is no acute fracture or joint effusion. There is mild generalized joint space narrowing.  There is calcification adjacent to the distal tibial metaphysis medially which may represent residua of old trauma. There is mild chondrocalcinosis within the ankle joint medially. Ankle mortise appears intact. No erosive changes. IMPRESSION: Areas of osteoarthritic change noted. There is soft tissue swelling. Mild chondrocalcinosis is noted, a finding that may be seen with osteoarthritis or with calcium pyrophosphate deposition disease. No evident fracture.  Ankle mortise appears intact. Electronically Signed   By: Lowella Grip III M.D.   On: 12/16/2017 14:43   Ct Head Wo Contrast  Result Date: 12/13/2017 CLINICAL DATA:  Altered level of consciousness, unexplained EXAM: CT HEAD WITHOUT CONTRAST TECHNIQUE: Contiguous axial images were obtained from the base of the skull through the vertex without intravenous contrast. COMPARISON:  Brain MRI 12/17/2012 FINDINGS: Brain: No evidence of acute infarction, hemorrhage, hydrocephalus, extra-axial collection or mass lesion/mass effect. Cerebral volume loss, central predominant. Mild chronic small vessel ischemic change in the cerebral white matter. Vascular: No hyperdense vessel or unexpected calcification. Skull: Negative Sinuses/Orbits: No acute finding.  Bilateral cataract resection. IMPRESSION: No acute or reversible finding. Mild atrophy and  chronic small vessel ischemia. Electronically Signed   By: Monte Fantasia M.D.   On: 12/13/2017 13:13   Ct Angio Chest Pe W And/or Wo Contrast  Addendum Date: 12/13/2017   ADDENDUM REPORT: 12/13/2017 18:40 ADDENDUM: Critical Value/emergent results were called by telephone at the time of interpretation on 12/13/2017 at 1804 hours to Dr. Tennis Must , who verbally acknowledged these results. Electronically Signed   By: Genevie Ann M.D.   On: 12/13/2017 18:40   Result Date: 12/13/2017 CLINICAL DATA:  68 year old female with right lower extremity DVT in the calf. Cough. Found down. EXAM: CT ANGIOGRAPHY CHEST WITH CONTRAST TECHNIQUE: Multidetector CT imaging of the chest was performed using the standard protocol during bolus administration of intravenous contrast. Multiplanar CT image reconstructions and MIPs were obtained to evaluate the vascular anatomy. CONTRAST:  133mL ISOVUE-370 IOPAMIDOL (ISOVUE-370) INJECTION 76% COMPARISON:  High-resolution chest CT 08/05/2017 and earlier. FINDINGS: Cardiovascular: Adequate contrast bolus timing in the pulmonary arterial tree. Lower lobe respiratory motion. There is a small pulmonary embolus in a branch to the posterior left upper lobe seen on series 6, image 97. No other left lung PE is evident. No central or saddle embolus. There is positive thrombus in the right middle lobe pulmonary artery on series 6, image 122. No other right lung PE identified. Mild cardiomegaly. No pericardial effusion. Calcified aortic atherosclerosis. Calcified coronary artery atherosclerosis. Mediastinum/Nodes: Negative, no lymphadenopathy. Lungs/Pleura: Low lung volumes with atelectasis. No pulmonary infarct or consolidation. Trace if any pleural fluid. The major airways are patent. Upper Abdomen: Surgically absent gallbladder. Negative visible liver, spleen, right adrenal gland. Mild fluid and gas distended visible proximal stomach. Musculoskeletal: Partially visible postoperative changes to the  C7 spinous process. Moderate to severe cervicothoracic junction facet degeneration is partially visible. No acute osseous abnormality identified. Review of the MIP images confirms the above findings. IMPRESSION: 1. Positive for small volume Acute Pulmonary Emboli in both lungs. 2. No pulmonary infarct identified. Trace if any pleural fluid. 3. Mild cardiomegaly. Calcified coronary artery and Aortic Atherosclerosis (ICD10-I70.0). Electronically Signed: By: Genevie Ann M.D. On: 12/13/2017 17:58   US Pelvis Limited (transabdominal Only)  Result Date: 12/17/2017 CLINICAL DATA:  Vaginal bleeding. EXAM: TRANSABDOMINAL ULTRASOUND OF PELVIS TECHNIQUE: Transabdominal ultrasound examination of the pelvis was performed including evaluation of the uterus, ovaries, adnexal regions, and pelvic cul-de-sac. COMPARISON:  None. FINDINGS: Uterus Previous hysterectomy. Endometrium Previous hysterectomy. Right  ovary Not visualized. Left ovary Not visualized. Other findings: No abnormal free fluid. Possible echogenic debris within the dependent portion of the bladder which could be seen with cystitis. IMPRESSION: Prior hysterectomy. Nonvisualization of the ovaries. No free pelvic fluid. Possible echogenic debris within the dependent portion of the bladder which could be seen with cystitis. Electronically Signed   By: Marin Olp M.D.   On: 12/17/2017 23:46   Dg Chest Portable 1 View  Result Date: 12/13/2017 CLINICAL DATA:  Cough.  The patient was found down. EXAM: PORTABLE CHEST 1 VIEW COMPARISON:  Chest x-ray dated 05/15/2017 and chest CT dated 08/05/2017 FINDINGS: Heart size and vascularity are normal. No infiltrates or effusions. Slight elevation of the right hemidiaphragm without visible atelectasis. No bone abnormality. IMPRESSION: No significant abnormality. Electronically Signed   By: Lorriane Shire M.D.   On: 12/13/2017 11:57   Vas [place] Lower Extremity Venous (dvt) (only [facility])  Result Date: 12/13/2017  Lower Venous  Study Indications: Pain.  Performing Technologist: Toma Copier RVS  Examination Guidelines: A complete evaluation includes B-mode imaging, spectral Doppler, color Doppler, and power Doppler as needed of all accessible portions of each vessel. Bilateral testing is considered an integral part of a complete examination. Limited examinations for reoccurring indications may be performed as noted.  Right Venous Findings: +---------+---------------+---------+-----------+----------+-------+          CompressibilityPhasicitySpontaneityPropertiesSummary +---------+---------------+---------+-----------+----------+-------+ CFV      Full           Yes      Yes                          +---------+---------------+---------+-----------+----------+-------+ SFJ      Full                                                 +---------+---------------+---------+-----------+----------+-------+ FV Prox  Full           Yes      Yes                          +---------+---------------+---------+-----------+----------+-------+ FV Mid   Full                                                 +---------+---------------+---------+-----------+----------+-------+ FV DistalFull           Yes      Yes                          +---------+---------------+---------+-----------+----------+-------+ PFV      Full           Yes      Yes                          +---------+---------------+---------+-----------+----------+-------+ POP      Full           Yes      Yes                          +---------+---------------+---------+-----------+----------+-------+ PTV      Full                                                 +---------+---------------+---------+-----------+----------+-------+  PERO     Partial                                              +---------+---------------+---------+-----------+----------+-------+  Left Venous Findings:  +---------+---------------+---------+-----------+----------+-------+          CompressibilityPhasicitySpontaneityPropertiesSummary +---------+---------------+---------+-----------+----------+-------+ CFV      Full           Yes      Yes                          +---------+---------------+---------+-----------+----------+-------+ SFJ      Full                                                 +---------+---------------+---------+-----------+----------+-------+ FV Prox  Full           Yes      Yes                          +---------+---------------+---------+-----------+----------+-------+ FV Mid   Full                                                 +---------+---------------+---------+-----------+----------+-------+ FV DistalFull           Yes      Yes                          +---------+---------------+---------+-----------+----------+-------+ PFV      Full           Yes      Yes                          +---------+---------------+---------+-----------+----------+-------+ POP      Full           Yes      Yes                          +---------+---------------+---------+-----------+----------+-------+ PTV      Full                                                 +---------+---------------+---------+-----------+----------+-------+ PERO     Full                                                 +---------+---------------+---------+-----------+----------+-------+    Summary: Right: Findings consistent with acute deep vein thrombosis involving the right peroneal vein. No cystic structure found in the popliteal fossa. Left: There is no evidence of deep vein thrombosis in the lower extremity. No cystic structure found in the popliteal fossa.  *See table(s) above for measurements and observations. Electronically signed by Monica Martinez MD on 12/13/2017 at 6:03:15 PM.  Final    Dg Knee 3 Views Left  Result Date: 12/15/2017 CLINICAL DATA:  Patient  reports knee pain . Says she fell on Friday. Says she twisted when she fell. EXAM: LEFT KNEE - 3 VIEW COMPARISON:  None. FINDINGS: No fracture.  No bone lesion. Knee joint normally spaced and aligned. No significant arthropathic change. Chondrocalcinosis extends along the menisci. Minimal joint effusion. Soft tissues are unremarkable. IMPRESSION: 1. No fracture. 2. Minimal joint effusion. Electronically Signed   By: Lajean Manes M.D.   On: 12/15/2017 15:36   US Abdomen Limited Ruq  Result Date: 12/13/2017 CLINICAL DATA:  67 year old female with hyperbilirubinemia. Cholecystectomy years ago. EXAM: ULTRASOUND ABDOMEN LIMITED RIGHT UPPER QUADRANT COMPARISON:  CTA chest today. FINDINGS: Gallbladder: Surgically absent Common bile duct: Diameter: 2-3 millimeters, normal. Liver: No focal lesion identified. Within normal limits in parenchymal echogenicity. No intrahepatic biliary ductal dilatation is evident on these images or the CTA chest today. Portal vein is patent on color Doppler imaging with normal direction of blood flow towards the liver. Other findings: Negative visible right kidney. IMPRESSION: Surgically absent gallbladder with no evidence of bile duct obstruction. Electronically Signed   By: Genevie Ann M.D.   On: 12/13/2017 18:48    Time Spent in minutes  30   Lala Lund M.D on 12/20/2017 at 11:58 AM  To page go to [email or web address] - password [facility]

## 2017-12-21 LAB — COMPREHENSIVE METABOLIC PANEL
ALT: 262 U/L — ABNORMAL HIGH (ref 0–44)
AST: 223 U/L — ABNORMAL HIGH (ref 15–41)
Albumin: 1.6 g/dL — ABNORMAL LOW (ref 3.5–5.0)
Alkaline Phosphatase: 87 U/L (ref 38–126)
Anion gap: 6 (ref 5–15)
BUN: 27 mg/dL — ABNORMAL HIGH (ref 8–23)
CO2: 21 mmol/L — ABNORMAL LOW (ref 22–32)
Calcium: 7.9 mg/dL — ABNORMAL LOW (ref 8.9–10.3)
Chloride: 112 mmol/L — ABNORMAL HIGH (ref 98–111)
Creatinine, Ser: 0.93 mg/dL (ref 0.44–1.00)
GFR calc Af Amer: >60 mL/min (ref >60)
GFR calc non Af Amer: >60 mL/min (ref >60)
Glucose, Bld: 117 mg/dL — ABNORMAL HIGH (ref 70–99)
Potassium: 3.9 mmol/L (ref 3.5–5.1)
Sodium: 139 mmol/L (ref 135–145)
Total Bilirubin: 0.5 mg/dL (ref 0.3–1.2)
Total Protein: 5.3 g/dL — ABNORMAL LOW (ref 6.5–8.1)

## 2017-12-21 LAB — CBC
HCT: 29.4 % — ABNORMAL LOW (ref 36.0–46.0)
Hemoglobin: 9.8 g/dL — ABNORMAL LOW (ref 12.0–15.0)
MCH: 31.9 pg (ref 26.0–34.0)
MCHC: 33.3 g/dL (ref 30.0–36.0)
MCV: 95.8 fL (ref 80.0–100.0)
Platelets: 390 10*3/uL (ref 150–400)
RBC: 3.07 MIL/uL — ABNORMAL LOW (ref 3.87–5.11)
RDW: 15.4 % (ref 11.5–15.5)
WBC: 16 10*3/uL — ABNORMAL HIGH (ref 4.0–10.5)
nRBC: 0.1 % (ref 0.0–0.2)

## 2017-12-21 LAB — MAGNESIUM: Magnesium: 1.8 mg/dL (ref 1.7–2.4)

## 2017-12-21 LAB — PHOSPHORUS: Phosphorus: 2.3 mg/dL — ABNORMAL LOW (ref 2.5–4.6)

## 2017-12-21 LAB — CK: Total CK: 98 U/L (ref 38–234)

## 2017-12-21 LAB — BRAIN NATRIURETIC PEPTIDE: B Natriuretic Peptide: 117 pg/mL — ABNORMAL HIGH (ref 0.0–100.0)

## 2017-12-21 MED ORDER — K PHOS MONO-SOD PHOS DI & MONO 155-852-130 MG PO TABS
500.0000 mg | ORAL_TABLET | Freq: Three times a day (TID) | ORAL | Status: DC
  Administered 2017-12-21 – 2017-12-23 (×7): 500 mg via ORAL
  Filled 2017-12-21 (×8): qty 2

## 2017-12-21 MED ORDER — POTASSIUM PHOSPHATE MONOBASIC 500 MG PO TABS
500.0000 mg | ORAL_TABLET | Freq: Three times a day (TID) | ORAL | Status: DC
  Filled 2017-12-21 (×3): qty 1

## 2017-12-21 MED ORDER — POTASSIUM CHLORIDE CRYS ER 20 MEQ PO TBCR
20.0000 meq | EXTENDED_RELEASE_TABLET | Freq: Once | ORAL | Status: AC
  Administered 2017-12-21: 20 meq via ORAL
  Filled 2017-12-21: qty 1

## 2017-12-21 MED ORDER — FUROSEMIDE 10 MG/ML IJ SOLN
40.0000 mg | Freq: Once | INTRAMUSCULAR | Status: AC
  Administered 2017-12-21: 40 mg via INTRAVENOUS
  Filled 2017-12-21: qty 4

## 2017-12-21 MED ORDER — GLUCERNA SHAKE PO LIQD
237.0000 mL | Freq: Three times a day (TID) | ORAL | Status: DC
  Administered 2017-12-21 – 2017-12-24 (×6): 237 mL via ORAL

## 2017-12-21 NOTE — Progress Notes (Signed)
PROGRESS NOTE                                                                                                                                                                                                             Patient Demographics:    Ebony Scott, is a 67 y.o. female, DOB - 26-Sep-1950, XLK:[id number]  Admit date - 12/13/2017   Admitting Physician Reubin Milan, MD  Outpatient Primary MD for the patient is [name], Cammie Mcgee, MD  LOS - 8  Chief Complaint  Patient presents with  . Fall       Brief Narrative    67 y.o.femalewitha hx ofanxiety, depression, asthma, breast cancer, chronic back pain, chronic neck pain, diverticulosis, DVT, fibromyalgia, GERD, Graves' disease, hyperlipidemia, IBS,andosteoporosis who was founddownby her daughter confusedand soiled w/vomit and feces. She does not remember what happened or having any symptoms prior to losing consciousness.   In the ED her vitals were unremarkable and a CXRdid not show any significant abnormality. CT head did not show any acute intracranial pathology. Bilateral lower extremities duplex showed RLE DVT.  CT angiogram of the chest shows bilateral small pulmonary embolism.    Subjective:   Patient in bed, appears comfortable, denies any headache, no fever, no chest pain or pressure, no shortness of breath , no abdominal pain. No focal weakness.  Lower extremity weakness much improved.   Assessment  & Plan :     1.  Right lower extremity acute DVT and small bilateral acute PEs.  Stable on Xarelto continue.  No shortness of breath or chest pain.  2.  SIRs.  Due to #1 above.  No infection stop on antibiotics.  3.  Fall at home, was on the floor for 2 to 3 days.  Patient does not have a good recollection of what happened, had rhabdomyolysis, head CT was nonacute, she is headache free with no focal deficits but does have generalized bilateral lower extremity weakness, CK have normalized, TSH is  stable, she is now working with PT and lower extremity weakness has considerably improved, continue PT completely had ambulation and monitor, will require SNF for now.  4.  History of hypothyroidism.  Recently taken off of methimazole.  TSH is stable at 5.  5.  Left foot/ankle pseudogout.  Improved with steroids and colchicine.  Steroids stopped on 12/21/2017.  Stable uric acid.  X-ray stable.  6.  Severe protein calorie malnutrition.  Placed on pro-stat will monitor.  7.  Hypocalcemia.  Replaced  will monitor.  8.  Hypophosphatemia.  Replaced and stable.  9.  Mildly elevated LFTs.  Due to rhabdomyolysis. Right upper quadrant ultrasound stable.  Will trend and monitor.  10.  Iron deficiency anemia.  On oral iron, outpatient PCP driven age-appropriate iron deficiency anemia work-up.  11.  Dyslipidemia started on hold due to elevated LFTs.  12.  Chronic diastolic CHF.  Recent EF 65% on recent echocardiogram.  Currently compensated will monitor.  13.  GERD.  On PPI continue.  14.  Steroid induced leukocytosis.  Stable continue to monitor.  No fever.  No signs of infection.    Family Communication  : Granddaughter at bedside  Code Status : Full  Disposition Plan  : MedSurg  Consults  : None  Procedures  :    CT angiogram chest.  Showing small PE.  DVT Prophylaxis  : Xarelto  Lab Results  Component Value Date   PLT 390 12/21/2017    Diet :  Diet Order            Diet regular Room service appropriate? Yes; Fluid consistency: Thin  Diet effective now               Inpatient Medications Scheduled Meds: . calcium carbonate  1 tablet Oral BID WC  . colchicine  0.6 mg Oral Daily  . feeding supplement (ENSURE ENLIVE)  237 mL Oral TID WC  . feeding supplement (PRO-STAT SUGAR FREE 64)  30 mL Oral TID WC  . ferrous sulfate  325 mg Oral Q breakfast  . gabapentin  600 mg Oral QHS  . lactase  6,000 Units Oral TID WC  . mirtazapine  30 mg Oral QHS  . mometasone-formoterol   1 puff Inhalation BID  . montelukast  10 mg Oral Daily  . pantoprazole  40 mg Oral Daily  . rivaroxaban  15 mg Oral BID WC   Followed by  . [START ON 01/05/2018] rivaroxaban  20 mg Oral Q supper  . saccharomyces boulardii  250 mg Oral BID  . umeclidinium bromide  1 puff Inhalation Daily   Continuous Infusions:  PRN Meds:.acetaminophen, [DISCONTINUED] ondansetron **OR** ondansetron (ZOFRAN) IV, simethicone, sodium chloride, traMADol  Antibiotics  :   Anti-infectives (From admission, onward)   Start     Dose/Rate Route Frequency Ordered Stop   12/20/17 0600  clindamycin (CLEOCIN) capsule 300 mg  Status:  Discontinued     300 mg Oral Every 8 hours 12/19/17 1954 12/20/17 1200   12/17/17 0930  clindamycin (CLEOCIN) IVPB 600 mg  Status:  Discontinued     600 mg 100 mL/hr over 30 Minutes Intravenous Every 8 hours 12/17/17 0929 12/19/17 1952   12/17/17 0915  ceFAZolin (ANCEF) IVPB 1 g/50 mL premix  Status:  Discontinued     1 g 100 mL/hr over 30 Minutes Intravenous Every 8 hours 12/17/17 0912 12/17/17 0928   12/14/17 1300  cefTRIAXone (ROCEPHIN) 1 g in sodium chloride 0.9 % 100 mL IVPB  Status:  Discontinued     1 g 200 mL/hr over 30 Minutes Intravenous Every 24 hours 12/13/17 1701 12/14/17 1639   12/13/17 2200  metroNIDAZOLE (FLAGYL) IVPB 500 mg  Status:  Discontinued     500 mg 100 mL/hr over 60 Minutes Intravenous Every 8 hours 12/13/17 1701 12/14/17 1639   12/13/17 1315  cefTRIAXone (ROCEPHIN) 2 g in sodium chloride 0.9 % 100 mL IVPB     2 g 200 mL/hr over 30 Minutes Intravenous  Once 12/13/17 1304 12/13/17  1412   12/13/17 1315  metroNIDAZOLE (FLAGYL) IVPB 500 mg     500 mg 100 mL/hr over 60 Minutes Intravenous  Once 12/13/17 1304 12/13/17 1512   12/13/17 1130  cefTRIAXone (ROCEPHIN) 2 g in sodium chloride 0.9 % 100 mL IVPB  Status:  Discontinued     2 g 200 mL/hr over 30 Minutes Intravenous Every 24 hours 12/13/17 1126 12/13/17 1204   12/13/17 1130  azithromycin (ZITHROMAX) 500 mg  in sodium chloride 0.9 % 250 mL IVPB  Status:  Discontinued     500 mg 250 mL/hr over 60 Minutes Intravenous Every 24 hours 12/13/17 1126 12/13/17 1204          Objective:   Vitals:   12/20/17 2343 12/21/17 0531 12/21/17 0840 12/21/17 0843  BP: (!) 153/72 (!) 142/88    Pulse: 87 80    Resp:  12    Temp:  98.1 F (36.7 C)    TempSrc:      SpO2: 95% 98% 98% 98%  Weight:      Height:        Wt Readings from Last 3 Encounters:  12/14/17 60.4 kg  12/05/17 61.2 kg  11/27/17 61.1 kg     Intake/Output Summary (Last 24 hours) at 12/21/2017 1146 Last data filed at 12/21/2017 0532 Gross per 24 hour  Intake 410 ml  Output 603 ml  Net -193 ml     Physical Exam  Awake Alert, Oriented X 3, No new F.N deficits, strength much improved in her legs, normal affect Coleman.AT,PERRAL Supple Neck,No JVD, No cervical lymphadenopathy appriciated.  Symmetrical Chest wall movement, Good air movement bilaterally, CTAB RRR,No Gallops, Rubs or new Murmurs, No Parasternal Heave +ve B.Sounds, Abd Soft, No tenderness, No organomegaly appriciated, No rebound - guarding or rigidity. No Cyanosis, Clubbing or edema, No new Rash or bruise      Data Review:    CBC Recent Labs  Lab 12/16/17 0414 12/17/17 0530 12/17/17 2052 12/18/17 0256 12/19/17 0356 12/21/17 0237  WBC 12.5* 17.3*  --  14.7* 13.2* 16.0*  HGB 10.0* 10.4* 10.3* 10.0* 9.3* 9.8*  HCT 31.7* 32.1* 32.1* 30.0* 27.8* 29.4*  PLT 215 263  --  285 327 390  MCV 100.3* 99.4  --  96.5 95.2 95.8  MCH 31.6 32.2  --  32.2 31.8 31.9  MCHC 31.5 32.4  --  33.3 33.5 33.3  RDW 14.3 14.8  --  15.1 15.1 15.4    Chemistries  Recent Labs  Lab 12/15/17 0342 12/16/17 0414 12/17/17 0530 12/18/17 1106 12/19/17 0944 12/21/17 0237  NA 138 137 139 136 137 139  K 3.3* 4.8 4.0 3.6 3.7 3.9  CL 109 111 111 111 110 112*  CO2 21* 21* 20* 19* 20* 21*  GLUCOSE 98 91 96 121* 97 117*  BUN 20 17 13 16 16  27*  CREATININE 1.07* 1.01* 1.00 0.93 0.92 0.93    CALCIUM 7.1* 6.9* 6.5* 6.4* 6.4* 7.9*  MG  --   --  1.7 1.7  --  1.8  AST 85*  --   --  203*  --  223*  ALT 61*  --   --  122*  --  262*  ALKPHOS 58  --   --  76  --  87  BILITOT 0.9  --   --  0.7  --  0.5   ------------------------------------------------------------------------------------------------------------------ No results for input(s): CHOL, HDL, LDLCALC, TRIG, CHOLHDL, LDLDIRECT in the last 72 hours.  No results found for:  HGBA1C ------------------------------------------------------------------------------------------------------------------ No results for input(s): TSH, T4TOTAL, T3FREE, THYROIDAB in the last 72 hours.  Invalid input(s): FREET3 ------------------------------------------------------------------------------------------------------------------ No results for input(s): VITAMINB12, FOLATE, FERRITIN, TIBC, IRON, RETICCTPCT in the last 72 hours.  Coagulation profile No results for input(s): INR, PROTIME in the last 168 hours.  No results for input(s): DDIMER in the last 72 hours.  Cardiac Enzymes No results for input(s): CKMB, TROPONINI, MYOGLOBIN in the last 168 hours.  Invalid input(s): CK ------------------------------------------------------------------------------------------------------------------    Component Value Date/Time   BNP 117.0 (H) 12/21/2017 6962    Micro Results Recent Results (from the past 240 hour(s))  Blood Culture (routine x 2)     Status: None   Collection Time: 12/13/17 11:38 AM  Result Value Ref Range Status   Specimen Description BLOOD RIGHT ANTECUBITAL  Final   Special Requests   Final    BOTTLES DRAWN AEROBIC AND ANAEROBIC Blood Culture results may not be optimal due to an excessive volume of blood received in culture bottles   Culture   Final    NO GROWTH 5 DAYS Performed at [facility], [place] [address] [place], [place] [address]    Report Status 12/18/2017 FINAL  Final  Blood Culture (routine x 2)     Status:  None   Collection Time: 12/13/17 11:53 AM  Result Value Ref Range Status   Specimen Description BLOOD BLOOD RIGHT HAND  Final   Special Requests   Final    BOTTLES DRAWN AEROBIC AND ANAEROBIC Blood Culture adequate volume   Culture   Final    NO GROWTH 5 DAYS Performed at [facility], [place] [address], [place], [place] [address]    Report Status 12/18/2017 FINAL  Final  Urine culture     Status: None   Collection Time: 12/13/17  2:41 PM  Result Value Ref Range Status   Specimen Description URINE, RANDOM  Final   Special Requests NONE  Final   Culture   Final    NO GROWTH Performed at [facility], [place] [address], [place], [place] [address]    Report Status 12/14/2017 FINAL  Final  MRSA PCR Screening     Status: None   Collection Time: 12/14/17  2:45 AM  Result Value Ref Range Status   MRSA by PCR NEGATIVE NEGATIVE Final    Comment:        The GeneXpert MRSA Assay (FDA approved for NASAL specimens only), is one component of a comprehensive MRSA colonization surveillance program. It is not intended to diagnose MRSA infection nor to guide or monitor treatment for MRSA infections. Performed at [facility], [place] [address] [place], [place] [address]   Culture, blood (Routine X 2) w Reflex to ID Panel     Status: None (Preliminary result)   Collection Time: 12/17/17  2:20 PM  Result Value Ref Range Status   Specimen Description BLOOD LEFT ANTECUBITAL  Final   Special Requests   Final    BOTTLES DRAWN AEROBIC ONLY Blood Culture adequate volume   Culture   Final    NO GROWTH 4 DAYS Performed at [facility], [place] [address] [place], [place] [address]    Report Status PENDING  Incomplete  Culture, blood (Routine X 2) w Reflex to ID Panel     Status: None (Preliminary result)   Collection Time: 12/17/17  2:26 PM  Result Value Ref Range Status   Specimen Description BLOOD LEFT HAND  Final  Special Requests   Final    BOTTLES DRAWN AEROBIC ONLY Blood  Culture adequate volume   Culture   Final    NO GROWTH 4 DAYS Performed at [facility], [place] [address] [place], [place] [address]    Report Status PENDING  Incomplete  Body fluid culture     Status: None   Collection Time: 12/17/17  3:10 PM  Result Value Ref Range Status   Specimen Description SYNOVIAL  Final   Special Requests NONE  Final   Gram Stain   Final    FEW WBC PRESENT,BOTH PMN AND MONONUCLEAR NO ORGANISMS SEEN    Culture   Final    NO GROWTH 3 DAYS Performed at [facility], [place] [address] [place], [place] [address]    Report Status 12/20/2017 FINAL  Final  Culture, Urine     Status: Abnormal   Collection Time: 12/18/17  8:58 AM  Result Value Ref Range Status   Specimen Description URINE, RANDOM  Final   Special Requests   Final    NONE Performed at [facility], [place] [address], [place], [place] [address]    Culture MULTIPLE SPECIES PRESENT, SUGGEST RECOLLECTION (A)  Final   Report Status 12/20/2017 FINAL  Final  C difficile quick scan w PCR reflex     Status: None   Collection Time: 12/19/17 12:30 AM  Result Value Ref Range Status   C Diff antigen NEGATIVE NEGATIVE Final   C Diff toxin NEGATIVE NEGATIVE Final   C Diff interpretation No C. difficile detected.  Final    Comment: Performed at [facility], [place] [address] [place], [place] [address]    Radiology Reports Dg Chest 2 View  Result Date: 12/17/2017 CLINICAL DATA:  Hypoxia. EXAM: CHEST - 2 VIEW COMPARISON:  Radiograph December 13, 2017. FINDINGS: The heart size and mediastinal contours are within normal limits. Minimal bibasilar subsegmental atelectasis is noted with small pleural effusions. No pneumothorax is noted. The visualized skeletal structures are unremarkable. IMPRESSION: Minimal bibasilar subsegmental atelectasis with small pleural effusions. Electronically Signed   By: Marijo Conception, M.D.   On: 12/17/2017 11:25   Dg Ankle Complete Left  Result Date:  12/16/2017 CLINICAL DATA:  Pain EXAM: LEFT ANKLE COMPLETE - 3+ VIEW COMPARISON:  None. FINDINGS: Frontal, oblique, and lateral views obtained. There is generalized soft tissue swelling. There is no acute fracture or joint effusion. There is mild generalized joint space narrowing. There is calcification adjacent to the distal tibial metaphysis medially which may represent residua of old trauma. There is mild chondrocalcinosis within the ankle joint medially. Ankle mortise appears intact. No erosive changes. IMPRESSION: Areas of osteoarthritic change noted. There is soft tissue swelling. Mild chondrocalcinosis is noted, a finding that may be seen with osteoarthritis or with calcium pyrophosphate deposition disease. No evident fracture.  Ankle mortise appears intact. Electronically Signed   By: Lowella Grip III M.D.   On: 12/16/2017 14:43   Ct Head Wo Contrast  Result Date: 12/13/2017 CLINICAL DATA:  Altered level of consciousness, unexplained EXAM: CT HEAD WITHOUT CONTRAST TECHNIQUE: Contiguous axial images were obtained from the base of the skull through the vertex without intravenous contrast. COMPARISON:  Brain MRI 12/17/2012 FINDINGS: Brain: No evidence of acute infarction, hemorrhage, hydrocephalus, extra-axial collection or mass lesion/mass effect. Cerebral volume loss, central predominant. Mild chronic small vessel ischemic change in the cerebral white matter. Vascular: No hyperdense vessel or unexpected calcification. Skull: Negative Sinuses/Orbits: No acute  finding.  Bilateral cataract resection. IMPRESSION: No acute or reversible finding. Mild atrophy and chronic small vessel ischemia. Electronically Signed   By: Monte Fantasia M.D.   On: 12/13/2017 13:13   Ct Angio Chest Pe W And/or Wo Contrast  Addendum Date: 12/13/2017   ADDENDUM REPORT: 12/13/2017 18:40 ADDENDUM: Critical Value/emergent results were called by telephone at the time of interpretation on 12/13/2017 at 1804 hours to Dr.  Tennis Must , who verbally acknowledged these results. Electronically Signed   By: Genevie Ann M.D.   On: 12/13/2017 18:40   Result Date: 12/13/2017 CLINICAL DATA:  67 year old female with right lower extremity DVT in the calf. Cough. Found down. EXAM: CT ANGIOGRAPHY CHEST WITH CONTRAST TECHNIQUE: Multidetector CT imaging of the chest was performed using the standard protocol during bolus administration of intravenous contrast. Multiplanar CT image reconstructions and MIPs were obtained to evaluate the vascular anatomy. CONTRAST:  163mL ISOVUE-370 IOPAMIDOL (ISOVUE-370) INJECTION 76% COMPARISON:  High-resolution chest CT 08/05/2017 and earlier. FINDINGS: Cardiovascular: Adequate contrast bolus timing in the pulmonary arterial tree. Lower lobe respiratory motion. There is a small pulmonary embolus in a branch to the posterior left upper lobe seen on series 6, image 97. No other left lung PE is evident. No central or saddle embolus. There is positive thrombus in the right middle lobe pulmonary artery on series 6, image 122. No other right lung PE identified. Mild cardiomegaly. No pericardial effusion. Calcified aortic atherosclerosis. Calcified coronary artery atherosclerosis. Mediastinum/Nodes: Negative, no lymphadenopathy. Lungs/Pleura: Low lung volumes with atelectasis. No pulmonary infarct or consolidation. Trace if any pleural fluid. The major airways are patent. Upper Abdomen: Surgically absent gallbladder. Negative visible liver, spleen, right adrenal gland. Mild fluid and gas distended visible proximal stomach. Musculoskeletal: Partially visible postoperative changes to the C7 spinous process. Moderate to severe cervicothoracic junction facet degeneration is partially visible. No acute osseous abnormality identified. Review of the MIP images confirms the above findings. IMPRESSION: 1. Positive for small volume Acute Pulmonary Emboli in both lungs. 2. No pulmonary infarct identified. Trace if any pleural fluid.  3. Mild cardiomegaly. Calcified coronary artery and Aortic Atherosclerosis (ICD10-I70.0). Electronically Signed: By: Genevie Ann M.D. On: 12/13/2017 17:58   US Pelvis Limited (transabdominal Only)  Result Date: 12/17/2017 CLINICAL DATA:  Vaginal bleeding. EXAM: TRANSABDOMINAL ULTRASOUND OF PELVIS TECHNIQUE: Transabdominal ultrasound examination of the pelvis was performed including evaluation of the uterus, ovaries, adnexal regions, and pelvic cul-de-sac. COMPARISON:  None. FINDINGS: Uterus Previous hysterectomy. Endometrium Previous hysterectomy. Right ovary Not visualized. Left ovary Not visualized. Other findings: No abnormal free fluid. Possible echogenic debris within the dependent portion of the bladder which could be seen with cystitis. IMPRESSION: Prior hysterectomy. Nonvisualization of the ovaries. No free pelvic fluid. Possible echogenic debris within the dependent portion of the bladder which could be seen with cystitis. Electronically Signed   By: Marin Olp M.D.   On: 12/17/2017 23:46   Dg Chest Portable 1 View  Result Date: 12/13/2017 CLINICAL DATA:  Cough.  The patient was found down. EXAM: PORTABLE CHEST 1 VIEW COMPARISON:  Chest x-ray dated 05/15/2017 and chest CT dated 08/05/2017 FINDINGS: Heart size and vascularity are normal. No infiltrates or effusions. Slight elevation of the right hemidiaphragm without visible atelectasis. No bone abnormality. IMPRESSION: No significant abnormality. Electronically Signed   By: Lorriane Shire M.D.   On: 12/13/2017 11:57   Vas [place] Lower Extremity Venous (dvt) (only [facility])  Result Date: 12/13/2017  Lower Venous Study Indications: Pain.  Performing Technologist: [place]  Slaughter RVS  Examination Guidelines: A complete evaluation includes B-mode imaging, spectral Doppler, color Doppler, and power Doppler as needed of all accessible portions of each vessel. Bilateral testing is considered an integral part of a complete examination. Limited  examinations for reoccurring indications may be performed as noted.  Right Venous Findings: +---------+---------------+---------+-----------+----------+-------+          CompressibilityPhasicitySpontaneityPropertiesSummary +---------+---------------+---------+-----------+----------+-------+ CFV      Full           Yes      Yes                          +---------+---------------+---------+-----------+----------+-------+ SFJ      Full                                                 +---------+---------------+---------+-----------+----------+-------+ FV Prox  Full           Yes      Yes                          +---------+---------------+---------+-----------+----------+-------+ FV Mid   Full                                                 +---------+---------------+---------+-----------+----------+-------+ FV DistalFull           Yes      Yes                          +---------+---------------+---------+-----------+----------+-------+ PFV      Full           Yes      Yes                          +---------+---------------+---------+-----------+----------+-------+ POP      Full           Yes      Yes                          +---------+---------------+---------+-----------+----------+-------+ PTV      Full                                                 +---------+---------------+---------+-----------+----------+-------+ PERO     Partial                                              +---------+---------------+---------+-----------+----------+-------+  Left Venous Findings: +---------+---------------+---------+-----------+----------+-------+          CompressibilityPhasicitySpontaneityPropertiesSummary +---------+---------------+---------+-----------+----------+-------+ CFV      Full           Yes      Yes                          +---------+---------------+---------+-----------+----------+-------+ SFJ      Full                                                  +---------+---------------+---------+-----------+----------+-------+  FV Prox  Full           Yes      Yes                          +---------+---------------+---------+-----------+----------+-------+ FV Mid   Full                                                 +---------+---------------+---------+-----------+----------+-------+ FV DistalFull           Yes      Yes                          +---------+---------------+---------+-----------+----------+-------+ PFV      Full           Yes      Yes                          +---------+---------------+---------+-----------+----------+-------+ POP      Full           Yes      Yes                          +---------+---------------+---------+-----------+----------+-------+ PTV      Full                                                 +---------+---------------+---------+-----------+----------+-------+ PERO     Full                                                 +---------+---------------+---------+-----------+----------+-------+    Summary: Right: Findings consistent with acute deep vein thrombosis involving the right peroneal vein. No cystic structure found in the popliteal fossa. Left: There is no evidence of deep vein thrombosis in the lower extremity. No cystic structure found in the popliteal fossa.  *See table(s) above for measurements and observations. Electronically signed by Monica Martinez MD on 12/13/2017 at 6:03:15 PM.    Final    Dg Knee 3 Views Left  Result Date: 12/15/2017 CLINICAL DATA:  Patient reports knee pain . Says she fell on Friday. Says she twisted when she fell. EXAM: LEFT KNEE - 3 VIEW COMPARISON:  None. FINDINGS: No fracture.  No bone lesion. Knee joint normally spaced and aligned. No significant arthropathic change. Chondrocalcinosis extends along the menisci. Minimal joint effusion. Soft tissues are unremarkable. IMPRESSION: 1. No fracture. 2. Minimal joint  effusion. Electronically Signed   By: Lajean Manes M.D.   On: 12/15/2017 15:36   US Abdomen Limited Ruq  Result Date: 12/13/2017 CLINICAL DATA:  67 year old female with hyperbilirubinemia. Cholecystectomy years ago. EXAM: ULTRASOUND ABDOMEN LIMITED RIGHT UPPER QUADRANT COMPARISON:  CTA chest today. FINDINGS: Gallbladder: Surgically absent Common bile duct: Diameter: 2-3 millimeters, normal. Liver: No focal lesion identified. Within normal limits in parenchymal echogenicity. No intrahepatic biliary ductal dilatation is evident on these images or the CTA chest today. Portal vein is patent on color Doppler imaging with normal direction of blood  flow towards the liver. Other findings: Negative visible right kidney. IMPRESSION: Surgically absent gallbladder with no evidence of bile duct obstruction. Electronically Signed   By: Genevie Ann M.D.   On: 12/13/2017 18:48    Time Spent in minutes  30   Lala Lund M.D on 12/21/2017 at 11:46 AM  To page go to [email or web address] - password [facility]

## 2017-12-21 NOTE — Progress Notes (Signed)
CCMD called to notify that pt is brady in 30's. When pt assessed some of the lead sets were disconnected. Leads set placed back on pt and pt is in sinus rhythm HR in 80s. Pt asymptomatic entire time and VS stable. Will continue to monitor and treat pt per MD orders.

## 2017-12-21 NOTE — Progress Notes (Signed)
Physical Therapy Treatment Patient Details Name: Ebony Scott MRN: [id number] DOB: 03-Nov-1950 Today's Date: 12/21/2017    History of Present Illness PT is a 67 y.o. female with a hx of anxiety, depression, asthma, breast cancer, chronic back pain, chronic neck pain, DVT, fibromyalgia, and Graves' disease, who was found down by her daughter confused and soiled w/ vomit and feces on 12/13/2017. Last know to be well time 12/11/2017. CT negative for intracranial pathology.  Found with R LE DVT and small B PE, SIRS and low grade rhabdomyolysis per internal medicine.     PT Comments    Patient seen for mobility progression. Pt is making progress toward PT goals and tolerated ambulating  77ft with min/mod A +2 for safety. Continue to progress as tolerated.    Follow Up Recommendations  CIR     Equipment Recommendations  Rolling walker with 5" wheels    Recommendations for Other Services       Precautions / Restrictions Precautions Precautions: Fall    Mobility  Bed Mobility               General bed mobility comments: pt OOB in chair upon arrival  Transfers Overall transfer level: Needs assistance Equipment used: Rolling walker (2 wheeled) Transfers: Sit to/from Stand Sit to Stand: Min assist         General transfer comment: pt stood from recliner and BSC X2; cues for safe hand placement; assist to steady while standing; increased time to rise and achieve upright posture  Ambulation/Gait Ambulation/Gait assistance: Min assist;Mod assist;+2 safety/equipment Gait Distance (Feet): 25 Feet Assistive device: Rolling walker (2 wheeled) Gait Pattern/deviations: Decreased stride length;Trunk flexed;Decreased dorsiflexion - right;Step-through pattern;Decreased dorsiflexion - left Gait velocity: decreased   General Gait Details: multimodal cues for posture and vc for increased stride length and safe use of AD; pt limited by fatigue and SOB   Stairs              Wheelchair Mobility    Modified Rankin (Stroke Patients Only)       Balance Overall balance assessment: Needs assistance Sitting-balance support: Bilateral upper extremity supported;Feet supported Sitting balance-Leahy Scale: Poor     Standing balance support: Bilateral upper extremity supported Standing balance-Leahy Scale: Poor                              Cognition Arousal/Alertness: Awake/alert Behavior During Therapy: WFL for tasks assessed/performed Overall Cognitive Status: Within Functional Limits for tasks assessed                                        Exercises      General Comments        Pertinent Vitals/Pain Pain Assessment: Faces Faces Pain Scale: No hurt    Home Living                      Prior Function            PT Goals (current goals can now be found in the care plan section) Progress towards PT goals: Progressing toward goals    Frequency    Min 4X/week      PT Plan Current plan remains appropriate    Co-evaluation              AM-PAC PT "6 Clicks" Daily Activity  Outcome Measure  Difficulty turning over in bed (including adjusting bedclothes, sheets and blankets)?: Unable Difficulty moving from lying on back to sitting on the side of the bed? : Unable Difficulty sitting down on and standing up from a chair with arms (e.g., wheelchair, bedside commode, etc,.)?: Unable Help needed moving to and from a bed to chair (including a wheelchair)?: A Little Help needed walking in hospital room?: A Lot Help needed climbing 3-5 steps with a railing? : Total 6 Click Score: 9    End of Session Equipment Utilized During Treatment: Gait belt Activity Tolerance: Patient tolerated treatment well Patient left: in chair;with call bell/phone within reach;with chair alarm set;with family/visitor present Nurse Communication: Mobility status PT Visit Diagnosis: Unsteadiness on feet (R26.81);Other  abnormalities of gait and mobility (R26.89);Muscle weakness (generalized) (M62.81);History of falling (Z91.81);Pain Pain - Right/Left: Left Pain - part of body: Ankle and joints of foot     Time: 1047-1106 PT Time Calculation (min) (ACUTE ONLY): 19 min  Charges:  $Gait Training: 8-22 mins                     Earney Navy, PTA Acute Rehabilitation Services Pager: [phone] Office: [phone]     Darliss Cheney 12/21/2017, 1:09 PM

## 2017-12-22 LAB — COMPREHENSIVE METABOLIC PANEL
ALT: 231 U/L — ABNORMAL HIGH (ref 0–44)
AST: 125 U/L — ABNORMAL HIGH (ref 15–41)
Albumin: 1.9 g/dL — ABNORMAL LOW (ref 3.5–5.0)
Alkaline Phosphatase: 81 U/L (ref 38–126)
Anion gap: 9 (ref 5–15)
BUN: 34 mg/dL — ABNORMAL HIGH (ref 8–23)
CO2: 24 mmol/L (ref 22–32)
Calcium: 8.3 mg/dL — ABNORMAL LOW (ref 8.9–10.3)
Chloride: 106 mmol/L (ref 98–111)
Creatinine, Ser: 1.07 mg/dL — ABNORMAL HIGH (ref 0.44–1.00)
GFR calc Af Amer: 60 mL/min — ABNORMAL LOW (ref >60)
GFR calc non Af Amer: 52 mL/min — ABNORMAL LOW (ref >60)
Glucose, Bld: 108 mg/dL — ABNORMAL HIGH (ref 70–99)
Potassium: 3.5 mmol/L (ref 3.5–5.1)
Sodium: 139 mmol/L (ref 135–145)
Total Bilirubin: 0.6 mg/dL (ref 0.3–1.2)
Total Protein: 5.7 g/dL — ABNORMAL LOW (ref 6.5–8.1)

## 2017-12-22 LAB — CULTURE, BLOOD (ROUTINE X 2)
Culture: NO GROWTH
Culture: NO GROWTH
Special Requests: ADEQUATE
Special Requests: ADEQUATE

## 2017-12-22 LAB — PHOSPHORUS: Phosphorus: 4.2 mg/dL (ref 2.5–4.6)

## 2017-12-22 MED ORDER — HYDROCORTISONE 2.5 % RE CREA
TOPICAL_CREAM | Freq: Three times a day (TID) | RECTAL | Status: DC | PRN
  Administered 2017-12-23: 10:00:00 via RECTAL
  Filled 2017-12-22 (×2): qty 28.35

## 2017-12-22 MED ORDER — POTASSIUM CHLORIDE CRYS ER 20 MEQ PO TBCR
40.0000 meq | EXTENDED_RELEASE_TABLET | Freq: Once | ORAL | Status: AC
  Administered 2017-12-22: 40 meq via ORAL
  Filled 2017-12-22: qty 2

## 2017-12-22 MED ORDER — FUROSEMIDE 40 MG PO TABS
40.0000 mg | ORAL_TABLET | Freq: Once | ORAL | Status: AC
  Administered 2017-12-22: 40 mg via ORAL
  Filled 2017-12-22: qty 1

## 2017-12-22 NOTE — Progress Notes (Signed)
PROGRESS NOTE                                                                                                                                                                                                             Patient Demographics:    Ebony Scott, is a 67 y.o. female, DOB - Apr 17, 1950, GXQ:[id number]  Admit date - 12/13/2017   Admitting Physician Reubin Milan, MD  Outpatient Primary MD for the patient is [name], Cammie Mcgee, MD  LOS - 9  Chief Complaint  Patient presents with  . Fall       Brief Narrative    67 y.o.femalewitha hx ofanxiety, depression, asthma, breast cancer, chronic back pain, chronic neck pain, diverticulosis, DVT, fibromyalgia, GERD, Graves' disease, hyperlipidemia, IBS,andosteoporosis who was founddownby her daughter confusedand soiled w/vomit and feces. She does not remember what happened or having any symptoms prior to losing consciousness.   In the ED her vitals were unremarkable and a CXRdid not show any significant abnormality. CT head did not show any acute intracranial pathology. Bilateral lower extremities duplex showed RLE DVT.  CT angiogram of the chest shows bilateral small pulmonary embolism.    Subjective:   Patient in bed, appears comfortable, denies any headache, no fever, no chest pain or pressure, no shortness of breath , no abdominal pain. No focal weakness.   Assessment  & Plan :     1.  Right lower extremity acute DVT and small bilateral acute PEs.  Stable on Xarelto continue.  No shortness of breath or chest pain.  2.  SIRs.  Due to #1 above.  No infection stop on antibiotics.  3.  Fall at home, was on the floor for 2 to 3 days.  Patient does not have a good recollection of what happened, had rhabdomyolysis, head CT was nonacute, she is headache free with no focal deficits but does have generalized bilateral lower extremity weakness, CK have normalized, TSH is stable, she is now working with PT and lower  extremity weakness has considerably improved, continue PT completely had ambulation and monitor, will require SNF for now.  4.  History of hypothyroidism.  Recently taken off of methimazole.  TSH is stable at 5.  5.  Left foot/ankle pseudogout.  Improved with steroids and colchicine.  Steroids stopped on 12/21/2017.  Stable uric acid.  X-ray stable.  6.  Severe protein calorie malnutrition.  Placed on pro-stat will monitor.  7.  Hypocalcemia.  Replaced will monitor.  8.  Hypophosphatemia.  Replaced and stable.  9.  Mildly elevated LFTs.  Due to rhabdomyolysis + venous congestion, improved with Lasix, recheck in 1 week, RUQ [place] Stable.  10.  Iron deficiency anemia.  On oral iron, outpatient PCP driven age-appropriate iron deficiency anemia work-up.  11.  Dyslipidemia started on hold due to elevated LFTs.  12.  Chronic diastolic CHF.  Recent EF 65% on recent echocardiogram.  Currently compensated will monitor.  13.  GERD.  On PPI continue.  14.  Steroid induced leukocytosis.  Stable continue to monitor.  No fever.  No signs of infection.  15.  CKD 4.  Baseline creatinine close to 1.5 when back in the chart.  Monitor closely.     Family Communication  : Granddaughter at bedside  Code Status : Full  Disposition Plan  : MedSurg  Consults  : None  Procedures  :    CT angiogram chest.  Showing small PE.  DVT Prophylaxis  : Xarelto  Lab Results  Component Value Date   PLT 390 12/21/2017    Diet :  Diet Order            Diet regular Room service appropriate? Yes; Fluid consistency: Thin  Diet effective now               Inpatient Medications Scheduled Meds: . calcium carbonate  1 tablet Oral BID WC  . colchicine  0.6 mg Oral Daily  . feeding supplement (GLUCERNA SHAKE)  237 mL Oral TID BM  . feeding supplement (PRO-STAT SUGAR FREE 64)  30 mL Oral TID WC  . ferrous sulfate  325 mg Oral Q breakfast  . gabapentin  600 mg Oral QHS  . lactase  6,000 Units Oral TID WC   . mirtazapine  30 mg Oral QHS  . mometasone-formoterol  1 puff Inhalation BID  . montelukast  10 mg Oral Daily  . pantoprazole  40 mg Oral Daily  . phosphorus  500 mg Oral TID WC & HS  . rivaroxaban  15 mg Oral BID WC   Followed by  . [START ON 01/05/2018] rivaroxaban  20 mg Oral Q supper  . saccharomyces boulardii  250 mg Oral BID  . umeclidinium bromide  1 puff Inhalation Daily   Continuous Infusions:  PRN Meds:.acetaminophen, [DISCONTINUED] ondansetron **OR** ondansetron (ZOFRAN) IV, simethicone, sodium chloride, traMADol  Antibiotics  :   Anti-infectives (From admission, onward)   Start     Dose/Rate Route Frequency Ordered Stop   12/20/17 0600  clindamycin (CLEOCIN) capsule 300 mg  Status:  Discontinued     300 mg Oral Every 8 hours 12/19/17 1954 12/20/17 1200   12/17/17 0930  clindamycin (CLEOCIN) IVPB 600 mg  Status:  Discontinued     600 mg 100 mL/hr over 30 Minutes Intravenous Every 8 hours 12/17/17 0929 12/19/17 1952   12/17/17 0915  ceFAZolin (ANCEF) IVPB 1 g/50 mL premix  Status:  Discontinued     1 g 100 mL/hr over 30 Minutes Intravenous Every 8 hours 12/17/17 0912 12/17/17 0928   12/14/17 1300  cefTRIAXone (ROCEPHIN) 1 g in sodium chloride 0.9 % 100 mL IVPB  Status:  Discontinued     1 g 200 mL/hr over 30 Minutes Intravenous Every 24 hours 12/13/17 1701 12/14/17 1639   12/13/17 2200  metroNIDAZOLE (FLAGYL) IVPB 500 mg  Status:  Discontinued     500 mg 100 mL/hr over 60 Minutes Intravenous Every 8 hours 12/13/17 1701 12/14/17 1639   12/13/17 1315  cefTRIAXone (ROCEPHIN)  2 g in sodium chloride 0.9 % 100 mL IVPB     2 g 200 mL/hr over 30 Minutes Intravenous  Once 12/13/17 1304 12/13/17 1412   12/13/17 1315  metroNIDAZOLE (FLAGYL) IVPB 500 mg     500 mg 100 mL/hr over 60 Minutes Intravenous  Once 12/13/17 1304 12/13/17 1512   12/13/17 1130  cefTRIAXone (ROCEPHIN) 2 g in sodium chloride 0.9 % 100 mL IVPB  Status:  Discontinued     2 g 200 mL/hr over 30 Minutes  Intravenous Every 24 hours 12/13/17 1126 12/13/17 1204   12/13/17 1130  azithromycin (ZITHROMAX) 500 mg in sodium chloride 0.9 % 250 mL IVPB  Status:  Discontinued     500 mg 250 mL/hr over 60 Minutes Intravenous Every 24 hours 12/13/17 1126 12/13/17 1204          Objective:   Vitals:   12/21/17 2114 12/21/17 2237 12/22/17 0500 12/22/17 0759  BP:  131/69 (!) 144/82   Pulse:  84 75 83  Resp:  18  18  Temp:  98 F (36.7 C) 97.7 F (36.5 C)   TempSrc:  Oral Oral   SpO2: 95% 95% 97% 97%  Weight:      Height:        Wt Readings from Last 3 Encounters:  12/14/17 60.4 kg  12/05/17 61.2 kg  11/27/17 61.1 kg     Intake/Output Summary (Last 24 hours) at 12/22/2017 1024 Last data filed at 12/21/2017 1754 Gross per 24 hour  Intake -  Output 1800 ml  Net -1800 ml     Physical Exam  Awake Alert, Oriented X 3, No new F.N deficits, Normal affect, leg strength much improved 4/5 [place].AT,PERRAL Supple Neck,No JVD, No cervical lymphadenopathy appriciated.  Symmetrical Chest wall movement, Good air movement bilaterally, CTAB RRR,No Gallops, Rubs or new Murmurs, No Parasternal Heave +ve B.Sounds, Abd Soft, No tenderness, No organomegaly appriciated, No rebound - guarding or rigidity. No Cyanosis, Clubbing or edema, No new Rash or bruise    Data Review:    CBC Recent Labs  Lab 12/16/17 0414 12/17/17 0530 12/17/17 2052 12/18/17 0256 12/19/17 0356 12/21/17 0237  WBC 12.5* 17.3*  --  14.7* 13.2* 16.0*  HGB 10.0* 10.4* 10.3* 10.0* 9.3* 9.8*  HCT 31.7* 32.1* 32.1* 30.0* 27.8* 29.4*  PLT 215 263  --  285 327 390  MCV 100.3* 99.4  --  96.5 95.2 95.8  MCH 31.6 32.2  --  32.2 31.8 31.9  MCHC 31.5 32.4  --  33.3 33.5 33.3  RDW 14.3 14.8  --  15.1 15.1 15.4    Chemistries  Recent Labs  Lab 12/17/17 0530 12/18/17 1106 12/19/17 0944 12/21/17 0237 12/22/17 0314  NA 139 136 137 139 139  K 4.0 3.6 3.7 3.9 3.5  CL 111 111 110 112* 106  CO2 20* 19* 20* 21* 24  GLUCOSE 96 121*  97 117* 108*  BUN 13 16 16  27* 34*  CREATININE 1.00 0.93 0.92 0.93 1.07*  CALCIUM 6.5* 6.4* 6.4* 7.9* 8.3*  MG 1.7 1.7  --  1.8  --   AST  --  203*  --  223* 125*  ALT  --  122*  --  262* 231*  ALKPHOS  --  76  --  87 81  BILITOT  --  0.7  --  0.5 0.6   ------------------------------------------------------------------------------------------------------------------ No results for input(s): CHOL, HDL, LDLCALC, TRIG, CHOLHDL, LDLDIRECT in the last 72 hours.  No results found for: HGBA1C ------------------------------------------------------------------------------------------------------------------  No results for input(s): TSH, T4TOTAL, T3FREE, THYROIDAB in the last 72 hours.  Invalid input(s): FREET3 ------------------------------------------------------------------------------------------------------------------ No results for input(s): VITAMINB12, FOLATE, FERRITIN, TIBC, IRON, RETICCTPCT in the last 72 hours.  Coagulation profile No results for input(s): INR, PROTIME in the last 168 hours.  No results for input(s): DDIMER in the last 72 hours.  Cardiac Enzymes No results for input(s): CKMB, TROPONINI, MYOGLOBIN in the last 168 hours.  Invalid input(s): CK ------------------------------------------------------------------------------------------------------------------    Component Value Date/Time   BNP 117.0 (H) 12/21/2017 5456    Micro Results Recent Results (from the past 240 hour(s))  Blood Culture (routine x 2)     Status: None   Collection Time: 12/13/17 11:38 AM  Result Value Ref Range Status   Specimen Description BLOOD RIGHT ANTECUBITAL  Final   Special Requests   Final    BOTTLES DRAWN AEROBIC AND ANAEROBIC Blood Culture results may not be optimal due to an excessive volume of blood received in culture bottles   Culture   Final    NO GROWTH 5 DAYS Performed at [facility], [place] [address], [place], [place] [address]    Report Status 12/18/2017 FINAL   Final  Blood Culture (routine x 2)     Status: None   Collection Time: 12/13/17 11:53 AM  Result Value Ref Range Status   Specimen Description BLOOD BLOOD RIGHT HAND  Final   Special Requests   Final    BOTTLES DRAWN AEROBIC AND ANAEROBIC Blood Culture adequate volume   Culture   Final    NO GROWTH 5 DAYS Performed at [facility], [place] [address] [place], [place] [address]    Report Status 12/18/2017 FINAL  Final  Urine culture     Status: None   Collection Time: 12/13/17  2:41 PM  Result Value Ref Range Status   Specimen Description URINE, RANDOM  Final   Special Requests NONE  Final   Culture   Final    NO GROWTH Performed at [facility], [place] [address], [place], [place] [address]    Report Status 12/14/2017 FINAL  Final  MRSA PCR Screening     Status: None   Collection Time: 12/14/17  2:45 AM  Result Value Ref Range Status   MRSA by PCR NEGATIVE NEGATIVE Final    Comment:        The GeneXpert MRSA Assay (FDA approved for NASAL specimens only), is one component of a comprehensive MRSA colonization surveillance program. It is not intended to diagnose MRSA infection nor to guide or monitor treatment for MRSA infections. Performed at [facility], [place] [address] [place], [place] [address]   Culture, blood (Routine X 2) w Reflex to ID Panel     Status: None (Preliminary result)   Collection Time: 12/17/17  2:20 PM  Result Value Ref Range Status   Specimen Description BLOOD LEFT ANTECUBITAL  Final   Special Requests   Final    BOTTLES DRAWN AEROBIC ONLY Blood Culture adequate volume   Culture   Final    NO GROWTH 4 DAYS Performed at [facility], [place] [address] [place], [place] [address]    Report Status PENDING  Incomplete  Culture, blood (Routine X 2) w Reflex to ID Panel     Status: None (Preliminary result)   Collection Time: 12/17/17  2:26 PM  Result Value Ref Range Status   Specimen Description BLOOD LEFT HAND  Final   Special  Requests   Final    BOTTLES DRAWN AEROBIC ONLY Blood Culture adequate volume   Culture   Final    NO GROWTH 4 DAYS Performed at [facility], [place] [address], [place], [place] [address]    Report Status PENDING  Incomplete  Body fluid culture     Status: None   Collection Time: 12/17/17  3:10 PM  Result Value Ref Range Status   Specimen Description SYNOVIAL  Final   Special Requests NONE  Final   Gram Stain   Final    FEW WBC PRESENT,BOTH PMN AND MONONUCLEAR NO ORGANISMS SEEN    Culture   Final    NO GROWTH 3 DAYS Performed at [facility], [place] [address] [place], [place] [address]    Report Status 12/20/2017 FINAL  Final  Culture, Urine     Status: Abnormal   Collection Time: 12/18/17  8:58 AM  Result Value Ref Range Status   Specimen Description URINE, RANDOM  Final   Special Requests   Final    NONE Performed at [facility], [place] [address], [place], [place] [address]    Culture MULTIPLE SPECIES PRESENT, SUGGEST RECOLLECTION (A)  Final   Report Status 12/20/2017 FINAL  Final  C difficile quick scan w PCR reflex     Status: None   Collection Time: 12/19/17 12:30 AM  Result Value Ref Range Status   C Diff antigen NEGATIVE NEGATIVE Final   C Diff toxin NEGATIVE NEGATIVE Final   C Diff interpretation No C. difficile detected.  Final    Comment: Performed at [facility], [place] [address] [place], [place] [address]    Radiology Reports Dg Chest 2 View  Result Date: 12/17/2017 CLINICAL DATA:  Hypoxia. EXAM: CHEST - 2 VIEW COMPARISON:  Radiograph December 13, 2017. FINDINGS: The heart size and mediastinal contours are within normal limits. Minimal bibasilar subsegmental atelectasis is noted with small pleural effusions. No pneumothorax is noted. The visualized skeletal structures are unremarkable. IMPRESSION: Minimal bibasilar subsegmental atelectasis with small pleural effusions. Electronically Signed   By: Marijo Conception, M.D.   On: 12/17/2017 11:25    Dg Ankle Complete Left  Result Date: 12/16/2017 CLINICAL DATA:  Pain EXAM: LEFT ANKLE COMPLETE - 3+ VIEW COMPARISON:  None. FINDINGS: Frontal, oblique, and lateral views obtained. There is generalized soft tissue swelling. There is no acute fracture or joint effusion. There is mild generalized joint space narrowing. There is calcification adjacent to the distal tibial metaphysis medially which may represent residua of old trauma. There is mild chondrocalcinosis within the ankle joint medially. Ankle mortise appears intact. No erosive changes. IMPRESSION: Areas of osteoarthritic change noted. There is soft tissue swelling. Mild chondrocalcinosis is noted, a finding that may be seen with osteoarthritis or with calcium pyrophosphate deposition disease. No evident fracture.  Ankle mortise appears intact. Electronically Signed   By: Lowella Grip III M.D.   On: 12/16/2017 14:43   Ct Head Wo Contrast  Result Date: 12/13/2017 CLINICAL DATA:  Altered level of consciousness, unexplained EXAM: CT HEAD WITHOUT CONTRAST TECHNIQUE: Contiguous axial images were obtained from the base of the skull through the vertex without intravenous contrast. COMPARISON:  Brain MRI 12/17/2012 FINDINGS: Brain: No evidence of acute infarction, hemorrhage, hydrocephalus, extra-axial collection or mass lesion/mass effect. Cerebral volume loss, central predominant. Mild chronic small vessel ischemic change in the cerebral white matter. Vascular: No hyperdense vessel or unexpected calcification. Skull: Negative Sinuses/Orbits: No acute finding.  Bilateral cataract resection. IMPRESSION: No acute or reversible finding. Mild atrophy and chronic small vessel ischemia. Electronically Signed   By: Monte Fantasia M.D.   On: 12/13/2017 13:13   Ct Angio Chest Pe W And/or Wo Contrast  Addendum Date: 12/13/2017   ADDENDUM REPORT: 12/13/2017 18:40 ADDENDUM: Critical Value/emergent results were called by telephone at the time of  interpretation on 12/13/2017 at 1804 hours to Dr. Tennis Must , who verbally acknowledged these results. Electronically Signed   By: Genevie Ann M.D.   On: 12/13/2017 18:40   Result Date: 12/13/2017 CLINICAL DATA:  67 year old female with right lower extremity DVT in the calf. Cough. Found down. EXAM: CT ANGIOGRAPHY CHEST WITH CONTRAST TECHNIQUE: Multidetector CT imaging of the chest was performed using the standard protocol during bolus administration of intravenous contrast. Multiplanar CT image reconstructions and MIPs were obtained to evaluate the vascular anatomy. CONTRAST:  167mL ISOVUE-370 IOPAMIDOL (ISOVUE-370) INJECTION 76% COMPARISON:  High-resolution chest CT 08/05/2017 and earlier. FINDINGS: Cardiovascular: Adequate contrast bolus timing in the pulmonary arterial tree. Lower lobe respiratory motion. There is a small pulmonary embolus in a branch to the posterior left upper lobe seen on series 6, image 97. No other left lung PE is evident. No central or saddle embolus. There is positive thrombus in the right middle lobe pulmonary artery on series 6, image 122. No other right lung PE identified. Mild cardiomegaly. No pericardial effusion. Calcified aortic atherosclerosis. Calcified coronary artery atherosclerosis. Mediastinum/Nodes: Negative, no lymphadenopathy. Lungs/Pleura: Low lung volumes with atelectasis. No pulmonary infarct or consolidation. Trace if any pleural fluid. The major airways are patent. Upper Abdomen: Surgically absent gallbladder. Negative visible liver, spleen, right adrenal gland. Mild fluid and gas distended visible proximal stomach. Musculoskeletal: Partially visible postoperative changes to the C7 spinous process. Moderate to severe cervicothoracic junction facet degeneration is partially visible. No acute osseous abnormality identified. Review of the MIP images confirms the above findings. IMPRESSION: 1. Positive for small volume Acute Pulmonary Emboli in both lungs. 2. No  pulmonary infarct identified. Trace if any pleural fluid. 3. Mild cardiomegaly. Calcified coronary artery and Aortic Atherosclerosis (ICD10-I70.0). Electronically Signed: By: Genevie Ann M.D. On: 12/13/2017 17:58   US Pelvis Limited (transabdominal Only)  Result Date: 12/17/2017 CLINICAL DATA:  Vaginal bleeding. EXAM: TRANSABDOMINAL ULTRASOUND OF PELVIS TECHNIQUE: Transabdominal ultrasound examination of the pelvis was performed including evaluation of the uterus, ovaries, adnexal regions, and pelvic cul-de-sac. COMPARISON:  None. FINDINGS: Uterus Previous hysterectomy. Endometrium Previous hysterectomy. Right ovary Not visualized. Left ovary Not visualized. Other findings: No abnormal free fluid. Possible echogenic debris within the dependent portion of the bladder which could be seen with cystitis. IMPRESSION: Prior hysterectomy. Nonvisualization of the ovaries. No free pelvic fluid. Possible echogenic debris within the dependent portion of the bladder which could be seen with cystitis. Electronically Signed   By: Marin Olp M.D.   On: 12/17/2017 23:46   Dg Chest Portable 1 View  Result Date: 12/13/2017 CLINICAL DATA:  Cough.  The patient was found down. EXAM: PORTABLE CHEST 1 VIEW COMPARISON:  Chest x-ray dated 05/15/2017 and chest CT dated 08/05/2017 FINDINGS: Heart size and vascularity are normal. No infiltrates or effusions. Slight elevation of the right hemidiaphragm without visible atelectasis. No bone abnormality. IMPRESSION: No significant abnormality. Electronically Signed   By: Lorriane Shire M.D.   On: 12/13/2017 11:57   Vas [place] Lower Extremity Venous (dvt) (only [facility])  Result Date: 12/13/2017  Lower Venous Study Indications: Pain.  Performing Technologist: [place]  Examination Guidelines: A complete evaluation includes B-mode imaging, spectral Doppler, color Doppler, and power Doppler as needed of all accessible portions of each vessel. Bilateral testing is considered an  integral part of a complete examination. Limited examinations for reoccurring indications may be performed as noted.  Right Venous Findings: +---------+---------------+---------+-----------+----------+-------+          CompressibilityPhasicitySpontaneityPropertiesSummary +---------+---------------+---------+-----------+----------+-------+ CFV      Full           Yes      Yes                          +---------+---------------+---------+-----------+----------+-------+ SFJ      Full                                                 +---------+---------------+---------+-----------+----------+-------+ FV Prox  Full           Yes      Yes                          +---------+---------------+---------+-----------+----------+-------+ FV Mid   Full                                                 +---------+---------------+---------+-----------+----------+-------+ FV DistalFull           Yes      Yes                          +---------+---------------+---------+-----------+----------+-------+ PFV      Full           Yes      Yes                          +---------+---------------+---------+-----------+----------+-------+ POP      Full           Yes      Yes                          +---------+---------------+---------+-----------+----------+-------+ PTV      Full                                                 +---------+---------------+---------+-----------+----------+-------+ PERO     Partial                                              +---------+---------------+---------+-----------+----------+-------+  Left Venous Findings: +---------+---------------+---------+-----------+----------+-------+          CompressibilityPhasicitySpontaneityPropertiesSummary +---------+---------------+---------+-----------+----------+-------+ CFV      Full           Yes      Yes                           +---------+---------------+---------+-----------+----------+-------+ SFJ      Full                                                 +---------+---------------+---------+-----------+----------+-------+  FV Prox  Full           Yes      Yes                          +---------+---------------+---------+-----------+----------+-------+ FV Mid   Full                                                 +---------+---------------+---------+-----------+----------+-------+ FV DistalFull           Yes      Yes                          +---------+---------------+---------+-----------+----------+-------+ PFV      Full           Yes      Yes                          +---------+---------------+---------+-----------+----------+-------+ POP      Full           Yes      Yes                          +---------+---------------+---------+-----------+----------+-------+ PTV      Full                                                 +---------+---------------+---------+-----------+----------+-------+ PERO     Full                                                 +---------+---------------+---------+-----------+----------+-------+    Summary: Right: Findings consistent with acute deep vein thrombosis involving the right peroneal vein. No cystic structure found in the popliteal fossa. Left: There is no evidence of deep vein thrombosis in the lower extremity. No cystic structure found in the popliteal fossa.  *See table(s) above for measurements and observations. Electronically signed by Monica Martinez MD on 12/13/2017 at 6:03:15 PM.    Final    Dg Knee 3 Views Left  Result Date: 12/15/2017 CLINICAL DATA:  Patient reports knee pain . Says she fell on Friday. Says she twisted when she fell. EXAM: LEFT KNEE - 3 VIEW COMPARISON:  None. FINDINGS: No fracture.  No bone lesion. Knee joint normally spaced and aligned. No significant arthropathic change. Chondrocalcinosis extends along the  menisci. Minimal joint effusion. Soft tissues are unremarkable. IMPRESSION: 1. No fracture. 2. Minimal joint effusion. Electronically Signed   By: Lajean Manes M.D.   On: 12/15/2017 15:36   US Abdomen Limited Ruq  Result Date: 12/13/2017 CLINICAL DATA:  67 year old female with hyperbilirubinemia. Cholecystectomy years ago. EXAM: ULTRASOUND ABDOMEN LIMITED RIGHT UPPER QUADRANT COMPARISON:  CTA chest today. FINDINGS: Gallbladder: Surgically absent Common bile duct: Diameter: 2-3 millimeters, normal. Liver: No focal lesion identified. Within normal limits in parenchymal echogenicity. No intrahepatic biliary ductal dilatation is evident on these images or the CTA chest today. Portal vein is patent on color Doppler imaging with normal direction of blood  flow towards the liver. Other findings: Negative visible right kidney. IMPRESSION: Surgically absent gallbladder with no evidence of bile duct obstruction. Electronically Signed   By: Genevie Ann M.D.   On: 12/13/2017 18:48    Time Spent in minutes  30   Lala Lund M.D on 12/22/2017 at 10:24 AM  To page go to [email or web address] - password [email or web address] [facility]

## 2017-12-23 DIAGNOSIS — I82401 Acute embolism and thrombosis of unspecified deep veins of right lower extremity: Secondary | ICD-10-CM

## 2017-12-23 DIAGNOSIS — R5381 Other malaise: Secondary | ICD-10-CM

## 2017-12-23 LAB — PHOSPHORUS: Phosphorus: 5.6 mg/dL — ABNORMAL HIGH (ref 2.5–4.6)

## 2017-12-23 LAB — COMPREHENSIVE METABOLIC PANEL
ALT: 183 U/L — ABNORMAL HIGH (ref 0–44)
AST: 77 U/L — ABNORMAL HIGH (ref 15–41)
Albumin: 2.1 g/dL — ABNORMAL LOW (ref 3.5–5.0)
Alkaline Phosphatase: 73 U/L (ref 38–126)
Anion gap: 8 (ref 5–15)
BUN: 34 mg/dL — ABNORMAL HIGH (ref 8–23)
CO2: 28 mmol/L (ref 22–32)
Calcium: 7.8 mg/dL — ABNORMAL LOW (ref 8.9–10.3)
Chloride: 102 mmol/L (ref 98–111)
Creatinine, Ser: 1.08 mg/dL — ABNORMAL HIGH (ref 0.44–1.00)
GFR calc Af Amer: 59 mL/min — ABNORMAL LOW (ref >60)
GFR calc non Af Amer: 51 mL/min — ABNORMAL LOW (ref >60)
Glucose, Bld: 91 mg/dL (ref 70–99)
Potassium: 3.5 mmol/L (ref 3.5–5.1)
Sodium: 138 mmol/L (ref 135–145)
Total Bilirubin: 0.7 mg/dL (ref 0.3–1.2)
Total Protein: 5.6 g/dL — ABNORMAL LOW (ref 6.5–8.1)

## 2017-12-23 LAB — MAGNESIUM: Magnesium: 1.6 mg/dL — ABNORMAL LOW (ref 1.7–2.4)

## 2017-12-23 MED ORDER — RIVAROXABAN 20 MG PO TABS: 20.0000 mg | ORAL_TABLET | Freq: Every day | ORAL | Status: DC

## 2017-12-23 MED ORDER — RIVAROXABAN 15 MG PO TABS: 15.0000 mg | ORAL_TABLET | Freq: Two times a day (BID) | ORAL | 0 refills | Status: DC

## 2017-12-23 MED ORDER — POTASSIUM CHLORIDE CRYS ER 20 MEQ PO TBCR
20.0000 meq | EXTENDED_RELEASE_TABLET | Freq: Once | ORAL | Status: AC
  Administered 2017-12-23: 20 meq via ORAL
  Filled 2017-12-23: qty 1

## 2017-12-23 MED ORDER — METHYLPREDNISOLONE SODIUM SUCC 125 MG IJ SOLR
60.0000 mg | Freq: Once | INTRAMUSCULAR | Status: AC
  Administered 2017-12-23: 60 mg via INTRAVENOUS
  Filled 2017-12-23: qty 2

## 2017-12-23 MED ORDER — GLUCERNA SHAKE PO LIQD: 237.0000 mL | Freq: Three times a day (TID) | ORAL | 0 refills | Status: DC

## 2017-12-23 MED ORDER — MAGNESIUM SULFATE 2 GM/50ML IV SOLN
2.0000 g | Freq: Once | INTRAVENOUS | Status: AC
  Administered 2017-12-23: 2 g via INTRAVENOUS
  Filled 2017-12-23: qty 50

## 2017-12-23 MED ORDER — POTASSIUM CHLORIDE CRYS ER 20 MEQ PO TBCR
40.0000 meq | EXTENDED_RELEASE_TABLET | Freq: Once | ORAL | Status: AC
  Administered 2017-12-23: 40 meq via ORAL
  Filled 2017-12-23: qty 2

## 2017-12-23 MED ORDER — FUROSEMIDE 40 MG PO TABS
40.0000 mg | ORAL_TABLET | Freq: Once | ORAL | Status: AC
  Administered 2017-12-23: 40 mg via ORAL
  Filled 2017-12-23: qty 1

## 2017-12-23 NOTE — Progress Notes (Signed)
[facility] PHYSICAL MEDICINE & REHABILITATION PROGRESS NOTE  Subjective/Complaints: Patient seen sitting up in bed.  She states she slept well overnight.  She states she is hopeful about the prospect of coming to CIR.  ROS: Denies CP, SOB, nausea, vomiting, diarrhea.  Objective: Vital Signs: Blood pressure 133/65, pulse (!) 102, temperature 98.2 F (36.8 C), temperature source Oral, resp. rate 18, height 5\' 1"  (1.549 m), weight 60.4 kg, SpO2 100 %. No results found. Recent Labs    12/21/17 0237  WBC 16.0*  HGB 9.8*  HCT 29.4*  PLT 390   Recent Labs    12/22/17 0314 12/23/17 0638  NA 139 138  K 3.5 3.5  CL 106 102  CO2 24 28  GLUCOSE 108* 91  BUN 34* 34*  CREATININE 1.07* 1.08*  CALCIUM 8.3* 7.8*    Physical Exam: BP 133/65 (BP Location: Left Arm)   Pulse (!) 102   Temp 98.2 F (36.8 C) (Oral)   Resp 18   Ht 5\' 1"  (1.549 m)   Wt 60.4 kg   SpO2 100%   BMI 25.16 kg/m  Constitutional: No distress . Vital signs reviewed. HENT: Normocephalic.  Atraumatic. Eyes: EOMI. No discharge. Cardiovascular: RRR. No JVD. Respiratory: CTA Bilaterally. Normal effort. GI: BS +. Non-distended. Musc: No edema or tenderness in extremities. Neurology: Alert Motor: Bilateral upper extremities grossly 4-/5 proximal distant Bilateral lower extremities: Grossly 2+/5 proximal to distal  Assessment/Plan:  1.  Debility  Continue therapies  Discussed with Biomedical scientist for peer to peer.  Patient denied CIR.  Recommend SNF.  Patient prefers to go to [facility].  2.  Leukocytosis   Repeat labs, cont to monitor for signs and symptoms of infection, further workup if indicated  3.  DVT  Continue Xarelto  4.  Chronic back pain/fibromyalgia  Biofeedback training with therapies to help reduce reliance on opiate pain medications, monitor pain control during therapies, and sedation at rest and titrate to maximum efficacy to ensure participation and gains in therapies  LOS: 10 days A  FACE TO FACE EVALUATION WAS PERFORMED  Lareina Espino Lorie Phenix 12/23/2017, 4:20 PM

## 2017-12-23 NOTE — Progress Notes (Signed)
Physical Therapy Treatment Patient Details Name: Ebony Scott MRN: [id number] DOB: Jul 13, 1950 Today's Date: 12/23/2017    History of Present Illness PT is a 67 y.o. female with a hx of anxiety, depression, asthma, breast cancer, chronic back pain, chronic neck pain, DVT, fibromyalgia, and Graves' disease, who was found down by her daughter confused and soiled w/ vomit and feces on 12/13/2017. Last know to be well time 12/11/2017. CT negative for intracranial pathology.  Found with R LE DVT and small B PE, SIRS and low grade rhabdomyolysis per internal medicine.     PT Comments    Pt was limited today secondary to multiple episodes of diarrhea. Pt expressed that she is weak and wants to participate in PT to progress and get her strength back, but she has been limited due to constant BM's. Pt was able to transfer from bed to [facility] using RW and do four sit to stands from [facility]  that required Mod A + 2 only able to tolerate standing for 30 sec before asking to sit back down. With every sit to stand performed, pt would have another bout of diarrhea. Pt was left on [facility] with call bell in reach and NT was notified at end of session. Pt does seem motivated but is limited due to weakness, she would benefit from cont's PT in order to progress toward stated goals. Plan remains appropriate for CIR based on current function.      Follow Up Recommendations  CIR     Equipment Recommendations  Rolling walker with 5" wheels    Recommendations for Other Services Rehab consult     Precautions / Restrictions Precautions Precautions: Fall Precaution Comments:  Hesitant to move/WB LLE Restrictions Weight Bearing Restrictions: No    Mobility  Bed Mobility Overal bed mobility: Needs Assistance Bed Mobility: Rolling Rolling: Min assist Sidelying to sit: Mod assist Supine to sit: Mod assist     General bed mobility comments: pt laying in bed upon arrival. Pt required mod A to power up into sitting and  advancing LE off EOB  Transfers Overall transfer level: Needs assistance Equipment used: Rolling walker (2 wheeled) Transfers: Sit to/from Stand Sit to Stand: Mod assist;+2 physical assistance;+2 safety/equipment;From elevated surface(Bed was elevated to aide in standing) Stand pivot transfers: Max assist;+2 physical assistance       General transfer comment: Pt transfered from bed to [facility] with Mod A +2. Pt performed 3 sit to stands from [facility]; only able to stand for 30 sec each and requiring Mod A + 2 to prevent LOB.  Ambulation/Gait Ambulation/Gait assistance: Mod assist;+2 physical assistance;+2 safety/equipment Gait Distance (Feet): 4 Feet(x 2 from bed to [facility] then to chair) Assistive device: Rolling walker (2 wheeled) Gait Pattern/deviations: Decreased stride length;Trunk flexed;Decreased dorsiflexion - right;Step-through pattern;Decreased dorsiflexion - left     General Gait Details: multimodal cues for posture and safe use of AD; pt limited by fatigue and SOB. C/o of dizziness with standing that subsided    Marine scientist [place] (Stroke Patients Only)       Balance Overall balance assessment: Needs assistance Sitting-balance support: Bilateral upper extremity supported;Feet supported Sitting balance-Leahy Scale: Poor Sitting balance - Comments: requires up to mod assist to maintain sitting balance EOB without LE support Postural control: Right lateral lean Standing balance support: Bilateral upper extremity supported Standing balance-Leahy Scale: Poor Standing balance comment: Mod A +2 to maintain balance  with RW                            Cognition Arousal/Alertness: Awake/alert Behavior During Therapy: WFL for tasks assessed/performed Overall Cognitive Status: Within Functional Limits for tasks assessed                                 General Comments: Pt was limited today due to [facility]       Exercises      General Comments        Pertinent Vitals/Pain Pain Assessment: Faces Faces Pain Scale: Hurts little more Pain Location: L foot Pain Descriptors / Indicators: Discomfort;Sore Pain Intervention(s): Limited activity within patient's tolerance;Monitored during session;Repositioned    Home Living                      Prior Function            PT Goals (current goals can now be found in the care plan section) Acute Rehab PT Goals Patient Stated Goal: To feel better.  PT Goal Formulation: With patient/family Time For Goal Achievement: 12/29/17 Potential to Achieve Goals: Fair Progress towards PT goals: Progressing toward goals    Frequency    Min 4X/week      PT Plan Current plan remains appropriate    Co-evaluation              AM-PAC PT "6 Clicks" Daily Activity  Outcome Measure  Difficulty turning over in bed (including adjusting bedclothes, sheets and blankets)?: Unable Difficulty moving from lying on back to sitting on the side of the bed? : Unable Difficulty sitting down on and standing up from a chair with arms (e.g., wheelchair, bedside commode, etc,.)?: A Lot Help needed moving to and from a bed to chair (including a wheelchair)?: A Lot Help needed walking in hospital room?: A Lot Help needed climbing 3-5 steps with a railing? : Total 6 Click Score: 9    End of Session Equipment Utilized During Treatment: Gait belt Activity Tolerance: Patient limited by fatigue;Other (comment)(Treatment limited secondary to [facility]) Patient left: with call bell/phone within reach;Other (comment)(Pt left on BSC at end of session and NT called to assist pt when done with BM.) Nurse Communication: Mobility status PT Visit Diagnosis: Unsteadiness on feet (R26.81);Other abnormalities of gait and mobility (R26.89);Muscle weakness (generalized) (M62.81);History of falling (Z91.81);Pain Pain - Right/Left: Left Pain - part of body: Ankle and joints of  foot     Time: 2947-6546 PT Time Calculation (min) (ACUTE ONLY): 27 min  Charges:  $Therapeutic Activity: 23-37 mins                     [address], SPTA   [place] 12/23/2017, 12:00 PM

## 2017-12-23 NOTE — Discharge Summary (Signed)
Ebony Scott:[id number] DOB: 10-Sep-1950 DOA: 12/13/2017  PCP: [name], MD  Admit date: 12/13/2017  Discharge date: 12/23/2017  Admitted From: Home  Disposition:  SNF   Recommendations for Outpatient Follow-up:   Follow up with PCP in 1-2 weeks  PCP Please obtain BMP/CBC, 2 view CXR in 1week,  (see Discharge instructions)   PCP Please follow up on the following pending results:    Home Health: None   Equipment/Devices: None  Consultations: None Discharge Condition: Stable   CODE STATUS: Full   Diet Recommendation: Heart Healthy     Chief Complaint  Patient presents with  . Fall     Brief history of present illness from the day of admission and additional interim summary    67 y.o.femalewitha hx ofanxiety, depression, asthma, breast cancer, chronic back pain, chronic neck pain, diverticulosis, DVT, fibromyalgia, GERD, Graves' disease, hyperlipidemia, IBS,andosteoporosis who was founddownby her daughter confusedand soiled w/vomit and feces. She does not remember what happened or having any symptoms prior to losing consciousness.   In the ED her vitals were unremarkable and a CXRdid not show any significant abnormality. CT head did not show any acute intracranial pathology. Bilateral lower extremities duplex showed RLE DVT. CT angiogram of the chest shows bilateral small pulmonary embolism.                                                                 Hospital Course    1.  Right lower extremity acute DVT and small bilateral acute PEs.  Stable on Xarelto continue.  No shortness of breath or chest pain.  2.  SIRs.  Due to #1 above.  No infection stop on antibiotics.  3.  Fall at home, was on the floor for 2 to 3 days.  Patient does not have a good recollection of what  happened, had rhabdomyolysis, head CT was nonacute, she is headache free with no focal deficits but does have generalized bilateral lower extremity weakness, CK have normalized, TSH is stable, she is now working with PT and lower extremity weakness has considerably improved, continue PT her weakness is improving but will still qualify for SNF and will be discharged to SNF once bed is open.  4.  History of hypothyroidism.  Recently taken off of methimazole.  TSH is stable at 5.  To follow with PCP in 2 to 3 weeks for repeat TSH check.  5.  Left foot/ankle pseudogout.  Improved with steroids and colchicine.  Steroids stopped on 12/21/2017.  Stable uric acid.  X-ray stable.  Continue colchicine.  6.  Severe protein calorie malnutrition.  Placed on protein supplementation continue at SNF.  7.  Hypocalcemia, hypokalemia, hypomagnesemia, hypophosphatemia.    Adequately replaced kindly recheck at SNF in 2 to 3 days.  8.  Hypophosphatemia.  See #7 above.  9.  Mildly elevated LFTs.  Due to rhabdomyolysis + venous congestion, improved with Lasix, recheck in 1 week, RUQ [place] Stable.  10.  Iron deficiency anemia.  On oral iron, outpatient PCP driven age-appropriate iron deficiency anemia work-up.  11.  Dyslipidemia started on hold due to elevated LFTs.  12.  Chronic diastolic CHF.  Recent EF 65% on recent echocardiogram.  Currently compensated will monitor.  13.  GERD.  On PPI continue.  14.  Steroid induced leukocytosis.  Stable continue to monitor.  No fever.  No signs of infection.  15.  CKD 4.  Baseline creatinine close to 1.5 , stable  Monitor closely at SNF intermittently.    Discharge diagnosis     Principal Problem:   Sepsis due to undetermined organism [facility]) Active Problems:   Hyperthyroidism   Hyperlipidemia   GERD   Pseudogout   Chronic diastolic CHF (congestive heart failure) (HCC)   Rhabdomyolysis   Acute deep vein thrombosis (DVT) of right lower extremity (HCC)    Knee pain   History of breast cancer   Chronic pain syndrome   Fibromyalgia   Graves disease   Leukocytosis   Malnutrition of moderate degree   Acute left ankle pain    Discharge instructions    Discharge Instructions    Diet - low sodium heart healthy   Complete by:  As directed    Discharge instructions   Complete by:  As directed    Follow with Primary MD [name], MD in 7 days   Get CBC, CMP, 2 view Chest X ray -  checked  by Primary MD or SNF MD in 5-7 days    Activity: As tolerated with Full fall precautions use walker/cane & assistance as needed  Disposition SNF  Diet: Heart Healthy   Special Instructions: If you have smoked or chewed Tobacco  in the last 2 yrs please stop smoking, stop any regular Alcohol  and or any Recreational drug use.  On your next visit with your primary care physician please Get Medicines reviewed and adjusted.  Please request your Prim.MD to go over all Hospital Tests and Procedure/Radiological results at the follow up, please get all Hospital records sent to your Prim MD by signing hospital release before you go home.  If you experience worsening of your admission symptoms, develop shortness of breath, life threatening emergency, suicidal or homicidal thoughts you must seek medical attention immediately by calling 911 or calling your MD immediately  if symptoms less severe.  You Must read complete instructions/literature along with all the possible adverse reactions/side effects for all the Medicines you take and that have been prescribed to you. Take any new Medicines after you have completely understood and accpet all the possible adverse reactions/side effects.   Increase activity slowly   Complete by:  As directed       Discharge Medications   Allergies as of 12/23/2017      Reactions   Dust Mite Extract Shortness Of Breath, Other (See Comments)   "sneezing" (02/20/2012)   Molds & Smuts Shortness Of Breath   Morphine Sulfate  Itching   Other Shortness Of Breath, Other (See Comments)   Grass and weeds "sneezing; filled sinuses" (02/20/2012)   Penicillins Rash, Other (See Comments)   "welts" (02/20/2012) Has patient had a PCN reaction causing immediate rash, facial/tongue/throat swelling, SOB or lightheadedness with hypotension: Unknown Has patient had a PCN reaction causing severe rash involving mucus membranes or skin necrosis:  No Has patient had a PCN reaction that required hospitalization: No Has patient had a PCN reaction occurring within the last 10 years: No If all of the above answers are "NO", then may proceed with Cephalosporin use.   Reclast [zoledronic Acid] Other (See Comments)   Fever, Put in hospital, dr said it was a reaction from a reaction    Rofecoxib Swelling   Vioxx REACTION: feet swelling   Shrimp Flavor Anaphylaxis   ALL SHELLFISH   Tetracycline Hcl Nausea And Vomiting   Xolair [omalizumab] Other (See Comments)   Caused Blood clot   Dilaudid [hydromorphone Hcl] Itching   Hydrocodone-acetaminophen Nausea And Vomiting   Levofloxacin Other (See Comments)   REACTION: GI upset   Oxycodone Hcl Nausea And Vomiting   Paroxetine Nausea And Vomiting   Paxil   Celecoxib Swelling   Feet swelling   Diltiazem Swelling   Lactose Intolerance (gi) Other (See Comments)   Bloating and gas   Tree Extract Other (See Comments)   "tested and told I was allergic to it; never experienced a reaction to it" (02/20/2012)      Medication List    STOP taking these medications   cyclobenzaprine 5 MG tablet Commonly known as:  FLEXERIL   methimazole 5 MG tablet Commonly known as:  TAPAZOLE     TAKE these medications   acetaminophen 650 MG CR tablet Commonly known as:  TYLENOL Take 1,300 mg by mouth 2 (two) times daily.   AEROCHAMBER MV inhaler Use as instructed   albuterol 108 (90 Base) MCG/ACT inhaler Commonly known as:  PROVENTIL HFA;VENTOLIN HFA Inhale 2 puffs into the lungs every 4 (four) hours  as needed for wheezing or shortness of breath.   ALIGN 4 MG Caps Take 4 mg by mouth every evening.   Alpha-Lipoic Acid 600 MG Caps Take 600 mg by mouth daily.   atorvastatin 40 MG tablet Commonly known as:  LIPITOR Take 1 tablet (40 mg total) by mouth daily.   azelastine 0.1 % nasal spray Commonly known as:  ASTELIN USE 2 SPRAYS IN EACH NOSTRIL DAILY AS DIRECTED What changed:    how much to take  how to take this  when to take this  additional instructions   BEANO PO Take 1 capsule by mouth 3 (three) times daily as needed (gas/ bloating).   CALCIUM-MAGNESIUM PO Take 1 tablet by mouth at bedtime.   cetirizine 10 MG tablet Commonly known as:  ZYRTEC Take 5 mg by mouth every evening.   Colchicine 0.6 MG Caps TAKE 1 CAPSULE BY MOUTH DAILY AS NEEDED What changed:    how much to take  when to take this   CREON 24000-76000 units Cpep Generic drug:  Pancrelipase (Lip-Prot-Amyl) TAKE 1 CAPSULE BY MOUTH THREE TIMES DAILY (WITH MEALS) What changed:  See the new instructions.   cyproheptadine 4 MG tablet Commonly known as:  PERIACTIN TAKE 2 TABLETS BY MOUTH EVERY EVENING What changed:    how much to take  how to take this  when to take this  additional instructions   denosumab 60 MG/ML Soln injection Commonly known as:  PROLIA Inject 60 mg into the skin every 6 (six) months. Administer in upper arm, thigh, or abdomen   dexlansoprazole 60 MG capsule Commonly known as:  DEXILANT Take 1 capsule (60 mg total) by mouth daily.   diclofenac sodium 1 % Gel Commonly known as:  VOLTAREN Apply 2 g topically 4 (four) times daily.   EPINEPHrine 0.3 mg/0.3 mL  Soaj injection Commonly known as:  EPI-PEN Inject 0.3 mLs (0.3 mg total) into the muscle Once PRN for up to 1 dose. What changed:    when to take this  reasons to take this   feeding supplement (GLUCERNA SHAKE) Liqd Take 237 mLs by mouth 3 (three) times daily between meals.   FLUTTER Devi Use as  directed   gabapentin 300 MG capsule Commonly known as:  NEURONTIN Take 300 mg by mouth 2 (two) times daily. What changed:  Another medication with the same name was removed. Continue taking this medication, and follow the directions you see here.   hydrocortisone 2.5 % rectal cream Commonly known as:  ANUSOL-HC APPLY AS NEEDED AS DIRECTED What changed:  See the new instructions.   LACTAID FAST ACT 9000 units Tabs Generic drug:  Lactase Take 18,000 Units by mouth 4 (four) times daily as needed (dairy consumption).   lidocaine 5 % Commonly known as:  LIDODERM Place 1 patch onto the skin daily. Remove & Discard patch within 12 hours or as directed by MD What changed:    when to take this  reasons to take this   mirtazapine 30 MG disintegrating tablet Commonly known as:  REMERON SOL-TAB DISOLVE 1 TABLET BY MOUTH DAILY AT BEDTIME What changed:  See the new instructions.   mometasone 50 MCG/ACT nasal spray Commonly known as:  NASONEX Place 2 sprays into the nose 2 (two) times daily. What changed:  when to take this   mometasone-formoterol 200-5 MCG/ACT Aero Commonly known as:  DULERA Inhale 2 puffs into the lungs 2 (two) times daily. What changed:  how much to take   montelukast 10 MG tablet Commonly known as:  SINGULAIR Take 1 tablet (10 mg total) by mouth daily.   mupirocin ointment 2 % Commonly known as:  BACTROBAN APPLY IN THE NOSE 3 TIMES DAILY AS NEEDED What changed:    how much to take  how to take this  when to take this  additional instructions   nystatin 100000 UNIT/ML suspension Commonly known as:  MYCOSTATIN USE AS DIRECTED 5MLS IN MOUTH OR THROAT 4 TIMES DAILY What changed:  See the new instructions.   nystatin-triamcinolone ointment Commonly known as:  MYCOLOG Apply 1 application topically 3 (three) times daily as needed (yeast infection).   PRESCRIPTION MEDICATION Place 2 puffs into both ears once a week. Place capsule in dispenser and  instill 2 puffs in each ear once a week after washing hair. CS (X) AHCL    CHLORAMP/SULFU/AMPHO B/ H DROC 100-100-5 mg capsule - compounded at [facility]   pseudoephedrine-guaifenesin 60-600 MG 12 hr tablet Commonly known as:  MUCINEX D Take 1 tablet by mouth 2 (two) times daily.   ranitidine 300 MG tablet Commonly known as:  ZANTAC Take 1 tablet (300 mg total) by mouth daily. What changed:  when to take this   Rivaroxaban 15 MG Tabs tablet Commonly known as:  XARELTO Take 1 tablet (15 mg total) by mouth 2 (two) times daily with a meal for 21 days.   rivaroxaban 20 MG Tabs tablet Commonly known as:  XARELTO Take 1 tablet (20 mg total) by mouth daily with supper. Start taking on:  01/05/2018   SIMPLY SALINE NA Place 1 spray into both nostrils at bedtime.   SOOTHE OP Place 1 drop into both eyes 2 (two) times daily as needed (dry eyes).   Tiotropium Bromide Monohydrate 2.5 MCG/ACT Aers Inhale 2 puffs into the lungs daily.   VINATE  M 27-1 MG Tabs Take 1 tablet by mouth daily.   Vitamin D3 10000 units Tabs Take 1,000 Units by mouth daily.       Follow-up Information    [name], MD. Schedule an appointment as soon as possible for a visit in 1 week(s).   Specialty:  Family Medicine Contact information: [address] [place] [address] [phone]        Buford Dresser, MD .   Specialty:  Cardiology Contact information: [address] [place] [address] [phone]           Major procedures and Radiology Reports - PLEASE review detailed and final reports thoroughly  -     CT angiogram chest.  Showing small PE.  Dg Chest 2 View  Result Date: 12/17/2017 CLINICAL DATA:  Hypoxia. EXAM: CHEST - 2 VIEW COMPARISON:  Radiograph December 13, 2017. FINDINGS: The heart size and mediastinal contours are within normal limits. Minimal bibasilar subsegmental atelectasis is noted with small pleural effusions. No pneumothorax  is noted. The visualized skeletal structures are unremarkable. IMPRESSION: Minimal bibasilar subsegmental atelectasis with small pleural effusions. Electronically Signed   By: Marijo Conception, M.D.   On: 12/17/2017 11:25   Dg Ankle Complete Left  Result Date: 12/16/2017 CLINICAL DATA:  Pain EXAM: LEFT ANKLE COMPLETE - 3+ VIEW COMPARISON:  None. FINDINGS: Frontal, oblique, and lateral views obtained. There is generalized soft tissue swelling. There is no acute fracture or joint effusion. There is mild generalized joint space narrowing. There is calcification adjacent to the distal tibial metaphysis medially which may represent residua of old trauma. There is mild chondrocalcinosis within the ankle joint medially. Ankle mortise appears intact. No erosive changes. IMPRESSION: Areas of osteoarthritic change noted. There is soft tissue swelling. Mild chondrocalcinosis is noted, a finding that may be seen with osteoarthritis or with calcium pyrophosphate deposition disease. No evident fracture.  Ankle mortise appears intact. Electronically Signed   By: Lowella Grip III M.D.   On: 12/16/2017 14:43   Ct Head Wo Contrast  Result Date: 12/13/2017 CLINICAL DATA:  Altered level of consciousness, unexplained EXAM: CT HEAD WITHOUT CONTRAST TECHNIQUE: Contiguous axial images were obtained from the base of the skull through the vertex without intravenous contrast. COMPARISON:  Brain MRI 12/17/2012 FINDINGS: Brain: No evidence of acute infarction, hemorrhage, hydrocephalus, extra-axial collection or mass lesion/mass effect. Cerebral volume loss, central predominant. Mild chronic small vessel ischemic change in the cerebral white matter. Vascular: No hyperdense vessel or unexpected calcification. Skull: Negative Sinuses/Orbits: No acute finding.  Bilateral cataract resection. IMPRESSION: No acute or reversible finding. Mild atrophy and chronic small vessel ischemia. Electronically Signed   By: Monte Fantasia M.D.    On: 12/13/2017 13:13   Ct Angio Chest Pe W And/or Wo Contrast  Addendum Date: 12/13/2017   ADDENDUM REPORT: 12/13/2017 18:40 ADDENDUM: Critical Value/emergent results were called by telephone at the time of interpretation on 12/13/2017 at 1804 hours to Dr. Tennis Must , who verbally acknowledged these results. Electronically Signed   By: Genevie Ann M.D.   On: 12/13/2017 18:40   Result Date: 12/13/2017 CLINICAL DATA:  67 year old female with right lower extremity DVT in the calf. Cough. Found down. EXAM: CT ANGIOGRAPHY CHEST WITH CONTRAST TECHNIQUE: Multidetector CT imaging of the chest was performed using the standard protocol during bolus administration of intravenous contrast. Multiplanar CT image reconstructions and MIPs were obtained to evaluate the vascular anatomy. CONTRAST:  11mL ISOVUE-370 IOPAMIDOL (ISOVUE-370) INJECTION 76%  COMPARISON:  High-resolution chest CT 08/05/2017 and earlier. FINDINGS: Cardiovascular: Adequate contrast bolus timing in the pulmonary arterial tree. Lower lobe respiratory motion. There is a small pulmonary embolus in a branch to the posterior left upper lobe seen on series 6, image 97. No other left lung PE is evident. No central or saddle embolus. There is positive thrombus in the right middle lobe pulmonary artery on series 6, image 122. No other right lung PE identified. Mild cardiomegaly. No pericardial effusion. Calcified aortic atherosclerosis. Calcified coronary artery atherosclerosis. Mediastinum/Nodes: Negative, no lymphadenopathy. Lungs/Pleura: Low lung volumes with atelectasis. No pulmonary infarct or consolidation. Trace if any pleural fluid. The major airways are patent. Upper Abdomen: Surgically absent gallbladder. Negative visible liver, spleen, right adrenal gland. Mild fluid and gas distended visible proximal stomach. Musculoskeletal: Partially visible postoperative changes to the C7 spinous process. Moderate to severe cervicothoracic junction facet  degeneration is partially visible. No acute osseous abnormality identified. Review of the MIP images confirms the above findings. IMPRESSION: 1. Positive for small volume Acute Pulmonary Emboli in both lungs. 2. No pulmonary infarct identified. Trace if any pleural fluid. 3. Mild cardiomegaly. Calcified coronary artery and Aortic Atherosclerosis (ICD10-I70.0). Electronically Signed: By: Genevie Ann M.D. On: 12/13/2017 17:58   US Pelvis Limited (transabdominal Only)  Result Date: 12/17/2017 CLINICAL DATA:  Vaginal bleeding. EXAM: TRANSABDOMINAL ULTRASOUND OF PELVIS TECHNIQUE: Transabdominal ultrasound examination of the pelvis was performed including evaluation of the uterus, ovaries, adnexal regions, and pelvic cul-de-sac. COMPARISON:  None. FINDINGS: Uterus Previous hysterectomy. Endometrium Previous hysterectomy. Right ovary Not visualized. Left ovary Not visualized. Other findings: No abnormal free fluid. Possible echogenic debris within the dependent portion of the bladder which could be seen with cystitis. IMPRESSION: Prior hysterectomy. Nonvisualization of the ovaries. No free pelvic fluid. Possible echogenic debris within the dependent portion of the bladder which could be seen with cystitis. Electronically Signed   By: Marin Olp M.D.   On: 12/17/2017 23:46   Dg Chest Portable 1 View  Result Date: 12/13/2017 CLINICAL DATA:  Cough.  The patient was found down. EXAM: PORTABLE CHEST 1 VIEW COMPARISON:  Chest x-ray dated 05/15/2017 and chest CT dated 08/05/2017 FINDINGS: Heart size and vascularity are normal. No infiltrates or effusions. Slight elevation of the right hemidiaphragm without visible atelectasis. No bone abnormality. IMPRESSION: No significant abnormality. Electronically Signed   By: Lorriane Shire M.D.   On: 12/13/2017 11:57   Vas [place] Lower Extremity Venous (dvt) (only [facility])  Result Date: 12/13/2017  Lower Venous Study Indications: Pain.  Performing Technologist: Toma Copier  RVS  Examination Guidelines: A complete evaluation includes B-mode imaging, spectral Doppler, color Doppler, and power Doppler as needed of all accessible portions of each vessel. Bilateral testing is considered an integral part of a complete examination. Limited examinations for reoccurring indications may be performed as noted.  Right Venous Findings: +---------+---------------+---------+-----------+----------+-------+          CompressibilityPhasicitySpontaneityPropertiesSummary +---------+---------------+---------+-----------+----------+-------+ CFV      Full           Yes      Yes                          +---------+---------------+---------+-----------+----------+-------+ SFJ      Full                                                 +---------+---------------+---------+-----------+----------+-------+  FV Prox  Full           Yes      Yes                          +---------+---------------+---------+-----------+----------+-------+ FV Mid   Full                                                 +---------+---------------+---------+-----------+----------+-------+ FV DistalFull           Yes      Yes                          +---------+---------------+---------+-----------+----------+-------+ PFV      Full           Yes      Yes                          +---------+---------------+---------+-----------+----------+-------+ POP      Full           Yes      Yes                          +---------+---------------+---------+-----------+----------+-------+ PTV      Full                                                 +---------+---------------+---------+-----------+----------+-------+ PERO     Partial                                              +---------+---------------+---------+-----------+----------+-------+  Left Venous Findings: +---------+---------------+---------+-----------+----------+-------+           CompressibilityPhasicitySpontaneityPropertiesSummary +---------+---------------+---------+-----------+----------+-------+ CFV      Full           Yes      Yes                          +---------+---------------+---------+-----------+----------+-------+ SFJ      Full                                                 +---------+---------------+---------+-----------+----------+-------+ FV Prox  Full           Yes      Yes                          +---------+---------------+---------+-----------+----------+-------+ FV Mid   Full                                                 +---------+---------------+---------+-----------+----------+-------+ FV DistalFull           Yes      Yes                          +---------+---------------+---------+-----------+----------+-------+  PFV      Full           Yes      Yes                          +---------+---------------+---------+-----------+----------+-------+ POP      Full           Yes      Yes                          +---------+---------------+---------+-----------+----------+-------+ PTV      Full                                                 +---------+---------------+---------+-----------+----------+-------+ PERO     Full                                                 +---------+---------------+---------+-----------+----------+-------+    Summary: Right: Findings consistent with acute deep vein thrombosis involving the right peroneal vein. No cystic structure found in the popliteal fossa. Left: There is no evidence of deep vein thrombosis in the lower extremity. No cystic structure found in the popliteal fossa.  *See table(s) above for measurements and observations. Electronically signed by Monica Martinez MD on 12/13/2017 at 6:03:15 PM.    Final    Dg Knee 3 Views Left  Result Date: 12/15/2017 CLINICAL DATA:  Patient reports knee pain . Says she fell on Friday. Says she twisted when she fell.  EXAM: LEFT KNEE - 3 VIEW COMPARISON:  None. FINDINGS: No fracture.  No bone lesion. Knee joint normally spaced and aligned. No significant arthropathic change. Chondrocalcinosis extends along the menisci. Minimal joint effusion. Soft tissues are unremarkable. IMPRESSION: 1. No fracture. 2. Minimal joint effusion. Electronically Signed   By: Lajean Manes M.D.   On: 12/15/2017 15:36   US Abdomen Limited Ruq  Result Date: 12/13/2017 CLINICAL DATA:  67 year old female with hyperbilirubinemia. Cholecystectomy years ago. EXAM: ULTRASOUND ABDOMEN LIMITED RIGHT UPPER QUADRANT COMPARISON:  CTA chest today. FINDINGS: Gallbladder: Surgically absent Common bile duct: Diameter: 2-3 millimeters, normal. Liver: No focal lesion identified. Within normal limits in parenchymal echogenicity. No intrahepatic biliary ductal dilatation is evident on these images or the CTA chest today. Portal vein is patent on color Doppler imaging with normal direction of blood flow towards the liver. Other findings: Negative visible right kidney. IMPRESSION: Surgically absent gallbladder with no evidence of bile duct obstruction. Electronically Signed   By: Genevie Ann M.D.   On: 12/13/2017 18:48    Micro Results    Recent Results (from the past 240 hour(s))  Blood Culture (routine x 2)     Status: None   Collection Time: 12/13/17 11:38 AM  Result Value Ref Range Status   Specimen Description BLOOD RIGHT ANTECUBITAL  Final   Special Requests   Final    BOTTLES DRAWN AEROBIC AND ANAEROBIC Blood Culture results may not be optimal due to an excessive volume of blood received in culture bottles   Culture   Final    NO GROWTH 5 DAYS Performed at [facility] [address] [place], [place] [address]  Report Status 12/18/2017 FINAL  Final  Blood Culture (routine x 2)     Status: None   Collection Time: 12/13/17 11:53 AM  Result Value Ref Range Status   Specimen Description BLOOD BLOOD RIGHT HAND  Final   Special Requests    Final    BOTTLES DRAWN AEROBIC AND ANAEROBIC Blood Culture adequate volume   Culture   Final    NO GROWTH 5 DAYS Performed at [facility], [place] [address] [place], [place] [address]    Report Status 12/18/2017 FINAL  Final  Urine culture     Status: None   Collection Time: 12/13/17  2:41 PM  Result Value Ref Range Status   Specimen Description URINE, RANDOM  Final   Special Requests NONE  Final   Culture   Final    NO GROWTH Performed at [facility], [place] [address], [place], [place] [address]    Report Status 12/14/2017 FINAL  Final  MRSA PCR Screening     Status: None   Collection Time: 12/14/17  2:45 AM  Result Value Ref Range Status   MRSA by PCR NEGATIVE NEGATIVE Final    Comment:        The GeneXpert MRSA Assay (FDA approved for NASAL specimens only), is one component of a comprehensive MRSA colonization surveillance program. It is not intended to diagnose MRSA infection nor to guide or monitor treatment for MRSA infections. Performed at [facility], [place] [address] [place], [place] [address]   Culture, blood (Routine X 2) w Reflex to ID Panel     Status: None   Collection Time: 12/17/17  2:20 PM  Result Value Ref Range Status   Specimen Description BLOOD LEFT ANTECUBITAL  Final   Special Requests   Final    BOTTLES DRAWN AEROBIC ONLY Blood Culture adequate volume   Culture   Final    NO GROWTH 5 DAYS Performed at [facility], [place] [address] [place], [place] [address]    Report Status 12/22/2017 FINAL  Final  Culture, blood (Routine X 2) w Reflex to ID Panel     Status: None   Collection Time: 12/17/17  2:26 PM  Result Value Ref Range Status   Specimen Description BLOOD LEFT HAND  Final   Special Requests   Final    BOTTLES DRAWN AEROBIC ONLY Blood Culture adequate volume   Culture   Final    NO GROWTH 5 DAYS Performed at [facility], [place] [address], [place], [place] [address]    Report Status 12/22/2017 FINAL  Final    Body fluid culture     Status: None   Collection Time: 12/17/17  3:10 PM  Result Value Ref Range Status   Specimen Description SYNOVIAL  Final   Special Requests NONE  Final   Gram Stain   Final    FEW WBC PRESENT,BOTH PMN AND MONONUCLEAR NO ORGANISMS SEEN    Culture   Final    NO GROWTH 3 DAYS Performed at [facility], [place] [address], [place], [place] [address]    Report Status 12/20/2017 FINAL  Final  Culture, Urine     Status: Abnormal   Collection Time: 12/18/17  8:58 AM  Result Value Ref Range Status   Specimen Description URINE, RANDOM  Final   Special Requests   Final    NONE Performed at [facility], [place] [address], [place], [place] [address]  Culture MULTIPLE SPECIES PRESENT, SUGGEST RECOLLECTION (A)  Final   Report Status 12/20/2017 FINAL  Final  C difficile quick scan w PCR reflex     Status: None   Collection Time: 12/19/17 12:30 AM  Result Value Ref Range Status   C Diff antigen NEGATIVE NEGATIVE Final   C Diff toxin NEGATIVE NEGATIVE Final   C Diff interpretation No C. difficile detected.  Final    Comment: Performed at [facility], [place] [address] [place], [place] [address]    Today   Subjective    Ebony Scott today has no headache,no chest abdominal pain,no new weakness tingling or numbness, feels much better wants to go home today.    Objective   Blood pressure (!) 135/54, pulse 89, temperature 98.2 F (36.8 C), temperature source Oral, resp. rate 16, height 5\' 1"  (1.549 m), weight 60.4 kg, SpO2 93 %.   Intake/Output Summary (Last 24 hours) at 12/23/2017 0851 Last data filed at 12/23/2017 0700 Gross per 24 hour  Intake 120 ml  Output 1200 ml  Net -1080 ml    Exam Awake Alert, Oriented x 3, No new F.N deficits, Normal affect .AT,PERRAL Supple Neck,No JVD, No cervical lymphadenopathy appriciated.  Symmetrical Chest wall movement, Good air movement bilaterally, CTAB RRR,No Gallops,Rubs or new Murmurs, No Parasternal  Heave +ve B.Sounds, Abd Soft, Non tender, No organomegaly appriciated, No rebound -guarding or rigidity. No Cyanosis, Clubbing or edema, No new Rash or bruise   Data Review   CBC w Diff:  Lab Results  Component Value Date   WBC 16.0 (H) 12/21/2017   HGB 9.8 (L) 12/21/2017   HCT 29.4 (L) 12/21/2017   PLT 390 12/21/2017   LYMPHOPCT 4 12/14/2017   MONOPCT 14 12/14/2017   EOSPCT 0 12/14/2017   BASOPCT 0 12/14/2017    CMP:  Lab Results  Component Value Date   NA 138 12/23/2017   K 3.5 12/23/2017   CL 102 12/23/2017   CO2 28 12/23/2017   BUN 34 (H) 12/23/2017   CREATININE 1.08 (H) 12/23/2017   CREATININE 1.19 (H) 12/02/2017   PROT 5.6 (L) 12/23/2017   ALBUMIN 2.1 (L) 12/23/2017   BILITOT 0.7 12/23/2017   ALKPHOS 73 12/23/2017   AST 77 (H) 12/23/2017   ALT 183 (H) 12/23/2017  .   Total Time in preparing paper work, data evaluation and todays exam - 59 minutes  Lala Lund M.D on 12/23/2017 at 8:51 AM  [facility]   Office  [phone]

## 2017-12-23 NOTE — Progress Notes (Signed)
Physical Therapy Treatment Patient Details Name: Ebony Scott MRN: [id number] DOB: [birth year] Today's Date: 12/23/2017    History of Present Illness PT is a 67 y.o. female with a hx of anxiety, depression, asthma, breast cancer, chronic back pain, chronic neck pain, DVT, fibromyalgia, and Graves' disease, who was found down by her daughter confused and soiled w/ vomit and feces on 12/13/2017. Last know to be well time 12/11/2017. CT negative for intracranial pathology.  Found with R LE DVT and small B PE, SIRS and low grade rhabdomyolysis per internal medicine.     PT Comments    NT requested that therapist help to transfer pt from [facility] to chair. SPTA re-entered room to assist with transfer.Pt was able to participate in stand pivot transfer with Mod A +2  with RW to maintain balance and vc for safety with RW. Pt positioned in chair at end of session. Pt would benefit from cont'd PT in order to progress toward stated goals. Pt still seems appropriate for CIR d/c based on current function.   Follow Up Recommendations  CIR     Equipment Recommendations  Rolling walker with 5" wheels    Recommendations for Other Services Rehab consult     Precautions / Restrictions Precautions Precautions: Fall Precaution Comments:  Hesitant to move/WB LLE Restrictions Weight Bearing Restrictions: No    Mobility  Bed Mobility  Transfers Overall transfer level: Needs assistance Equipment used: Rolling walker (2 wheeled) Transfers: Sit to/from [employer] Transfers Sit to Stand: Mod assist;+2 physical assistance;+2 safety/equipment Stand pivot transfers: Mod assist;+2 physical assistance;+2 safety/equipment       General transfer comment: Stand pivot transfer from [facility] to chair, pt required Mod A + 2 to prevent LOB. Pt requiring vc for safety with AD.  Ambulation/Gait Ambulation/Gait assistance: Mod assist;+2 physical assistance;+2 safety/equipment Gait Distance (Feet): 4 Feet(x 2 from bed  to [facility] then to chair) Assistive device: Rolling walker (2 wheeled) Gait Pattern/deviations: Decreased stride length;Trunk flexed;Decreased dorsiflexion - right;Step-through pattern;Decreased dorsiflexion - left     General Gait Details: multimodal cues for posture and safe use of AD; pt limited by fatigue and SOB. C/o of dizziness with standing that subsided    Marine scientist [place] (Stroke Patients Only)       Balance Overall balance assessment: Needs assistance Sitting-balance support: Bilateral upper extremity supported;Feet supported Sitting balance-Leahy Scale: Poor Sitting balance - Comments: requires up to mod assist to maintain sitting balance EOB without LE support Postural control: Right lateral lean Standing balance support: Bilateral upper extremity supported Standing balance-Leahy Scale: Poor Standing balance comment: Mod A +2 to maintain balance with RW                            Cognition Arousal/Alertness: Awake/alert Behavior During Therapy: WFL for tasks assessed/performed Overall Cognitive Status: Within Functional Limits for tasks assessed                                 General Comments: Second treatment was completed because NT needed assist transfering pt from [facility] to chair.       Exercises      General Comments        Pertinent Vitals/Pain Pain Assessment: Faces Faces Pain Scale: Hurts little more Pain Location: L foot Pain Descriptors /  Indicators: Discomfort;Sore Pain Intervention(s): Limited activity within patient's tolerance;Monitored during session;Repositioned    Home Living                      Prior Function            PT Goals (current goals can now be found in the care plan section) Acute Rehab PT Goals Patient Stated Goal: To feel better.  PT Goal Formulation: With patient/family Time For Goal Achievement: 12/29/17 Potential to Achieve Goals:  Fair Progress towards PT goals: Progressing toward goals    Frequency    Min 4X/week      PT Plan Current plan remains appropriate    Co-evaluation              AM-PAC PT "6 Clicks" Daily Activity  Outcome Measure  Difficulty turning over in bed (including adjusting bedclothes, sheets and blankets)?: Unable Difficulty moving from lying on back to sitting on the side of the bed? : Unable Difficulty sitting down on and standing up from a chair with arms (e.g., wheelchair, bedside commode, etc,.)?: A Lot Help needed moving to and from a bed to chair (including a wheelchair)?: A Lot Help needed walking in hospital room?: A Lot Help needed climbing 3-5 steps with a railing? : Total 6 Click Score: 9    End of Session Equipment Utilized During Treatment: Gait belt Activity Tolerance: Patient limited by fatigue;Other (comment) Patient left: in chair;with chair alarm set;with call bell/phone within reach;with nursing/sitter in room Nurse Communication: Mobility status PT Visit Diagnosis: Unsteadiness on feet (R26.81);Other abnormalities of gait and mobility (R26.89);Muscle weakness (generalized) (M62.81);History of falling (Z91.81);Pain Pain - Right/Left: Left Pain - part of body: Ankle and joints of foot     Time: 6314-9702 PT Time Calculation (min) (ACUTE ONLY): 8 min  Charges:  $Therapeutic Activity: 8-22 mins                     [address], SPTA   [place] 12/23/2017, 2:11 PM

## 2017-12-23 NOTE — Progress Notes (Signed)
ANTICOAGULATION CONSULT NOTE - Follow Up Consult  Pharmacy Consult for Xarelto Indication: DVT and PE  Allergies  Allergen Reactions  . Dust Mite Extract Shortness Of Breath and Other (See Comments)    "sneezing" (02/20/2012)  . Molds & Smuts Shortness Of Breath  . Morphine Sulfate Itching  . Other Shortness Of Breath and Other (See Comments)    Grass and weeds "sneezing; filled sinuses" (02/20/2012)  . Penicillins Rash and Other (See Comments)    "welts" (02/20/2012) Has patient had a PCN reaction causing immediate rash, facial/tongue/throat swelling, SOB or lightheadedness with hypotension: Unknown Has patient had a PCN reaction causing severe rash involving mucus membranes or skin necrosis: No Has patient had a PCN reaction that required hospitalization: No Has patient had a PCN reaction occurring within the last 10 years: No If all of the above answers are "NO", then may proceed with Cephalosporin use.   Marland Kitchen Reclast [Zoledronic Acid] Other (See Comments)    Fever, Put in hospital, dr said it was a reaction from a reaction   . Rofecoxib Swelling    Vioxx REACTION: feet swelling  . Shrimp Flavor Anaphylaxis    ALL SHELLFISH  . Tetracycline Hcl Nausea And Vomiting  . Xolair [Omalizumab] Other (See Comments)    Caused Blood clot  . Dilaudid [Hydromorphone Hcl] Itching  . Hydrocodone-Acetaminophen Nausea And Vomiting  . Levofloxacin Other (See Comments)    REACTION: GI upset  . Oxycodone Hcl Nausea And Vomiting  . Paroxetine Nausea And Vomiting    Paxil   . Celecoxib Swelling    Feet swelling  . Diltiazem Swelling  . Lactose Intolerance (Gi) Other (See Comments)    Bloating and gas  . Tree Extract Other (See Comments)    "tested and told I was allergic to it; never experienced a reaction to it" (02/20/2012)    Patient Measurements: Height: 5\' 1"  (154.9 cm) Weight: 133 lb 2.5 oz (60.4 kg) IBW/kg (Calculated) : 47.8   Vital Signs: Temp: 98.2 F (36.8 C) (11/04 0531) Temp  Source: Oral (11/04 0531) BP: 135/54 (11/04 0531) Pulse Rate: 89 (11/04 0531)  Labs: Recent Labs    12/21/17 0237 12/22/17 0314 12/23/17 0638  HGB 9.8*  --   --   HCT 29.4*  --   --   PLT 390  --   --   CREATININE 0.93 1.07* 1.08*  CKTOTAL 98  --   --     Estimated Creatinine Clearance: 41 mL/min (A) (by C-G formula based on SCr of 1.08 mg/dL (H)).   Assessment:  Anticoag: new DVT/PE: none PTA. D/C heparin, new Xarelto start Hgb 9.8, PLT WNL,  Iron low - starting ferrous sulfate 325mg  10/30  Goal of Therapy:  Therapeutic oral anticoagulation   Plan:  Xarelto 15mg  BID x 21d then 20mg  qday. Awaiting rehab  Tiffaney Heimann S. Alford Highland, PharmD, [place] Clinical Staff Pharmacist Eilene Ghazi [place] 12/23/2017,1:50 PM

## 2017-12-23 NOTE — Progress Notes (Signed)
CSW received notification that insurance has denied CIR. CSW reached out to [facility] to see if they have bed available just in case CIR peer to peer is also denied.   Percell Locus Yvett Rossel LCSW [phone]

## 2017-12-23 NOTE — Progress Notes (Signed)
IP rehab admissions - We have received an initial denial for acute inpatient rehab admission.  I spoke with my rehab MD, Dr. Posey Pronto, and he will do a peer to peer review with insurance medical director to see if we can get approval for acute inpatient rehab admission.  I will update all once I hear back from insurance carrier.  Call me for questions.  [phone]

## 2017-12-24 NOTE — Progress Notes (Signed)
Nutrition Follow-up  DOCUMENTATION CODES:   Non-severe (moderate) malnutrition in context of chronic illness  INTERVENTION:   - Continue Glucerna Shake po TID, each supplement provides 220 kcal and 10 grams of protein  - Continue 30 ml Prostat BID, each supplement provides 100 kcals and 15 grams protein  NUTRITION DIAGNOSIS:   Moderate Malnutrition related to chronic illness (Graves diease, IBS) as evidenced by moderate fat depletion, moderate muscle depletion.  Ongoing  GOAL:   Patient will meet greater than or equal to 90% of their needs  Progressing  MONITOR:   PO intake, Supplement acceptance, Weight trends, I & O's, Labs  REASON FOR ASSESSMENT:   Consult Assessment of nutrition requirement/status  ASSESSMENT:   67 year old female who presented to the ED on 10/25 with AMS after a fall. PMH significant for anemia, Graves disease, breast cancer now in remission, DVT, GERD, hyperlipidemia, osteoporosis, and IBS. Pt found to have acute bilateral PE and acute right lower extremity DVT.  10/29 - aspiration of right ankle joint  Noted pt is awaiting SNF placement.  Spoke with pt at bedside. Pt resting in recliner with 75% completed lunch meal tray beside her.  Pt states that she is drinking Glucerna and Pro-stat. Pt prefers Glucerna over Ensure [place] as she believes Ensure Enlive caused her to have diarrhea. Will continue these supplements.  Pt very appreciative of the staff at [facility]. Pt states that she appreciates RD coming to check on her. Pt glad that she was able to walk 30 feet earlier today with PT.  Meal Completion: 10-100% x last 8 meals  Labs reviewed: Glucerna TID, Pro-stat 30 ml TID, ferrous sulfate 325 mg daily, Lactaid TID, calcium carbonate BID, Protonix 40 mg daily, Florastor daily  Labs reviewed: BUN 34 (H), creatinine 1.08 (H), phosphorus 5.6 (H), magnesium 1.6 (L)  Diet Order:   Diet Order            Diet - low sodium heart healthy        Diet  regular Room service appropriate? Yes; Fluid consistency: Thin  Diet effective now              EDUCATION NEEDS:   No education needs have been identified at this time  Skin:  Skin Assessment: Reviewed RN Assessment  Last BM:  11/4  Height:   Ht Readings from Last 1 Encounters:  12/14/17 5\' 1"  (1.549 m)    Weight:   Wt Readings from Last 1 Encounters:  12/14/17 60.4 kg    Ideal Body Weight:  47.73 kg  BMI:  Body mass index is 25.16 kg/m.  Estimated Nutritional Needs:   Kcal:  1550-1750  Protein:  75-90 grams  Fluid:  1.5-1.7 L    Gaynell Face, MS, RD, LDN Inpatient Clinical Dietitian Pager: [phone] Weekend/After Hours: [phone]

## 2017-12-24 NOTE — Clinical Social Work Placement (Signed)
   CLINICAL SOCIAL WORK PLACEMENT  NOTE  Date:  12/24/2017  Patient Details  Name: Ebony Scott MRN: [id number] Date of Birth: [birth year]  Clinical Social Work is seeking post-discharge placement for this patient at the [place] level of care (*CSW will initial, date and re-position this form in  chart as items are completed):  Yes   Patient/family provided with [facility] Work Department's list of facilities offering this level of care within the geographic area requested by the patient (or if unable, by the patient's family).  Yes   Patient/family informed of their freedom to choose among providers that offer the needed level of care, that participate in Medicare, Medicaid or managed care program needed by the patient, have an available bed and are willing to accept the patient.  Yes   Patient/family informed of [facility]'s ownership interest in [facility] and [facility], as well as of the fact that they are under no obligation to receive care at these facilities.  PASRR submitted to EDS on 12/20/17     PASRR number received on 12/20/17     Existing PASRR number confirmed on       FL2 transmitted to all facilities in geographic area requested by pt/family on 12/20/17     FL2 transmitted to all facilities within larger geographic area on       Patient informed that his/her managed care company has contracts with or will negotiate with certain facilities, including the following:        Yes   Patient/family informed of bed offers received.  Patient chooses bed at [facility]     Physician recommends and patient chooses bed at      Patient to be transferred to [facility] on 12/24/17.  Patient to be transferred to facility by [facility]     Patient family notified on 12/24/17 of transfer.  Name of family member notified:  Daughter, [name]     PHYSICIAN       Additional Comment:     _______________________________________________ Benard Halsted, LCSW 12/24/2017, 3:59 PM

## 2017-12-24 NOTE — Progress Notes (Signed)
Patient will DC to: [facility] Anticipated DC date: 12/24/17 Family notified: Daughter Transport by: Bess Kinds   Per MD patient ready for DC to [facility]. RN, patient, patient's family, and facility notified of DC. Discharge Summary and FL2 sent to facility. RN to call report prior to discharge [phone] Room 122A). DC packet on chart. Ambulance transport requested for patient.   CSW will sign off for now as social work intervention is no longer needed. Please consult [place] again if new needs arise.  Cedric Fishman, LCSW Clinical Social Worker [phone]

## 2017-12-24 NOTE — Progress Notes (Signed)
IP rehab admissions - Our rehab MD completed the peer to peer review yesterday afternoon.  The patient has been denied inpatient rehab by the medical director at the insurance carrier.  Her options will be SNF placement or home with [facility] therapies.  Call me for questions.  [phone]

## 2017-12-24 NOTE — Progress Notes (Signed)
[facility]                                                                                                                                                                         Patient Demographics  Ebony Scott, is a 67 y.o. female  WPY:[id number]  KNL:[id number]  DOB - [birth year]  Admit date - 12/13/2017  Admitting Physician Reubin Milan, MD  Outpatient Primary MD for the patient is [name], MD  LOS - 11   Chief Complaint  Patient presents with  . Fall        Assessment & Plan    Patient seen briefly today due for discharge soon per Discharge done yesterday by me, no further issues, Vital signs stable, patient feels fine. DC to SNF.    Medications  Scheduled Meds: . calcium carbonate  1 tablet Oral BID WC  . colchicine  0.6 mg Oral Daily  . feeding supplement (GLUCERNA SHAKE)  237 mL Oral TID BM  . feeding supplement (PRO-STAT SUGAR FREE 64)  30 mL Oral TID WC  . ferrous sulfate  325 mg Oral Q breakfast  . gabapentin  600 mg Oral QHS  . lactase  6,000 Units Oral TID WC  . mirtazapine  30 mg Oral QHS  . mometasone-formoterol  1 puff Inhalation BID  . montelukast  10 mg Oral Daily  . pantoprazole  40 mg Oral Daily  . rivaroxaban  15 mg Oral BID WC   Followed by  . [START ON 01/05/2018] rivaroxaban  20 mg Oral Q supper  . saccharomyces boulardii  250 mg Oral BID  . umeclidinium bromide  1 puff Inhalation Daily   Continuous Infusions: PRN Meds:.acetaminophen, hydrocortisone, [DISCONTINUED] ondansetron **OR** ondansetron (ZOFRAN) IV, simethicone, sodium chloride, traMADol    Time Spent in minutes   10 minutes   Ebony Scott M.D on 12/24/2017 at 7:13 AM  Between 7am to 7pm - Pager - [phone]  After 7pm go to [email or web address] - password [email or web address]  And look for the night coverage person covering for me after hours  [facility] Office  [phone]     Subjective:   Ebony Scott today has, No headache, No chest pain, No abdominal pain - No Nausea, No new weakness tingling or numbness, No Cough - SOB.   Objective:   Vitals:   12/23/17 0805 12/23/17 1518 12/23/17 2043 12/24/17 0412  BP:  133/65 131/65 (!) 155/75  Pulse:  (!) 102 92 87  Resp:  18 16 17   Temp:  98.2 F (36.8 C) 97.6 F (36.4 C) 97.6 F (36.4 C)  TempSrc:  Oral Oral Oral  SpO2: 93% 100% 95% 96%  Weight:      Height:  Wt Readings from Last 3 Encounters:  12/14/17 60.4 kg  12/05/17 61.2 kg  11/27/17 61.1 kg     Intake/Output Summary (Last 24 hours) at 12/24/2017 0713 Last data filed at 12/23/2017 2022 Gross per 24 hour  Intake -  Output 1600 ml  Net -1600 ml    Exam Awake Alert, Oriented X 3, No new F.N deficits, Normal affect [place].AT,PERRAL Supple Neck,No JVD, No cervical lymphadenopathy appriciated.  Symmetrical Chest wall movement, Good air movement bilaterally, CTAB RRR,No Gallops,Rubs or new Murmurs, No Parasternal Heave +ve B.Sounds, Abd Soft, Non tender, No organomegaly appriciated, No rebound - guarding or rigidity. No Cyanosis, Clubbing or edema, No new Rash or bruise     Data Review

## 2017-12-24 NOTE — Progress Notes (Signed)
CSW spoke with patient and her daughter at bedside to identify a SNF choice. CSW notified them that their first choice, [facility], is unable to offer a bed. They requested [facility] since it is the next closest to the patient's house. [facility] will start insurance authorization.  Percell Locus Nadina Fomby LCSW [phone]

## 2017-12-24 NOTE — Care Management Important Message (Signed)
Important Message  Patient Details  Name: Ebony Scott MRN: [id number] Date of Birth: 22-Feb-1950   Medicare Important Message Given:  Yes    Orbie Pyo 12/24/2017, 3:38 PM

## 2017-12-24 NOTE — Plan of Care (Signed)
Patient did very well with PT today. Walked out of room and transferred to the chair. She is smiling and states she feels so much better and is thankful for all of the help she has received since she has been admitted. She is anxious to go to rehab and is looking forward to continue to improve. Will continue to follow patient as needed.

## 2017-12-24 NOTE — Plan of Care (Addendum)
Nsg Discharge Note  Admit Date:  12/13/2017 Discharge date: 12/24/2017   Ebony Scott to be D/C'd Skilled nursing facility [facility]) per MD order.  AVS completed.  Copy for chart, and copy for patient signed, and dated. Patient/caregiver able to verbalize understanding.  Discharge Medication: Allergies as of 12/24/2017       Reactions   Dust Mite Extract Shortness Of Breath, Other (See Comments)   "sneezing" (02/20/2012)   Molds & Smuts Shortness Of Breath   Morphine Sulfate Itching   Other Shortness Of Breath, Other (See Comments)   Grass and weeds "sneezing; filled sinuses" (02/20/2012)   Penicillins Rash, Other (See Comments)   "welts" (02/20/2012) Has patient had a PCN reaction causing immediate rash, facial/tongue/throat swelling, SOB or lightheadedness with hypotension: Unknown Has patient had a PCN reaction causing severe rash involving mucus membranes or skin necrosis: No Has patient had a PCN reaction that required hospitalization: No Has patient had a PCN reaction occurring within the last 10 years: No If all of the above answers are "NO", then may proceed with Cephalosporin use.   Reclast [zoledronic Acid] Other (See Comments)   Fever, Put in hospital, dr said it was a reaction from a reaction    Rofecoxib Swelling   Vioxx REACTION: feet swelling   Shrimp Flavor Anaphylaxis   ALL SHELLFISH   Tetracycline Hcl Nausea And Vomiting   Xolair [omalizumab] Other (See Comments)   Caused Blood clot   Dilaudid [hydromorphone Hcl] Itching   Hydrocodone-acetaminophen Nausea And Vomiting   Levofloxacin Other (See Comments)   REACTION: GI upset   Oxycodone Hcl Nausea And Vomiting   Paroxetine Nausea And Vomiting   Paxil   Celecoxib Swelling   Feet swelling   Diltiazem Swelling   Lactose Intolerance (gi) Other (See Comments)   Bloating and gas   Tree Extract Other (See Comments)   "tested and told I was allergic to it; never experienced a reaction to it" (02/20/2012)         Medication List     STOP taking these medications    cyclobenzaprine 5 MG tablet Commonly known as:  FLEXERIL   methimazole 5 MG tablet Commonly known as:  TAPAZOLE       TAKE these medications    acetaminophen 650 MG CR tablet Commonly known as:  TYLENOL Take 1,300 mg by mouth 2 (two) times daily.   AEROCHAMBER MV inhaler Use as instructed   albuterol 108 (90 Base) MCG/ACT inhaler Commonly known as:  PROVENTIL HFA;VENTOLIN HFA Inhale 2 puffs into the lungs every 4 (four) hours as needed for wheezing or shortness of breath.   ALIGN 4 MG Caps Take 4 mg by mouth every evening.   Alpha-Lipoic Acid 600 MG Caps Take 600 mg by mouth daily.   atorvastatin 40 MG tablet Commonly known as:  LIPITOR Take 1 tablet (40 mg total) by mouth daily.   azelastine 0.1 % nasal spray Commonly known as:  ASTELIN USE 2 SPRAYS IN EACH NOSTRIL DAILY AS DIRECTED What changed:    how much to take  how to take this  when to take this  additional instructions   BEANO PO Take 1 capsule by mouth 3 (three) times daily as needed (gas/ bloating).   CALCIUM-MAGNESIUM PO Take 1 tablet by mouth at bedtime.   cetirizine 10 MG tablet Commonly known as:  ZYRTEC Take 5 mg by mouth every evening.   Colchicine 0.6 MG Caps TAKE 1 CAPSULE BY MOUTH DAILY AS NEEDED  What changed:    how much to take  when to take this   CREON 24000-76000 units Cpep Generic drug:  Pancrelipase (Lip-Prot-Amyl) TAKE 1 CAPSULE BY MOUTH THREE TIMES DAILY (WITH MEALS) What changed:  See the new instructions.   cyproheptadine 4 MG tablet Commonly known as:  PERIACTIN TAKE 2 TABLETS BY MOUTH EVERY EVENING What changed:    how much to take  how to take this  when to take this  additional instructions   denosumab 60 MG/ML Soln injection Commonly known as:  PROLIA Inject 60 mg into the skin every 6 (six) months. Administer in upper arm, thigh, or abdomen   dexlansoprazole 60 MG  capsule Commonly known as:  DEXILANT Take 1 capsule (60 mg total) by mouth daily.   diclofenac sodium 1 % Gel Commonly known as:  VOLTAREN Apply 2 g topically 4 (four) times daily.   EPINEPHrine 0.3 mg/0.3 mL Soaj injection Commonly known as:  EPI-PEN Inject 0.3 mLs (0.3 mg total) into the muscle Once PRN for up to 1 dose. What changed:    when to take this  reasons to take this   feeding supplement (GLUCERNA SHAKE) Liqd Take 237 mLs by mouth 3 (three) times daily between meals.   FLUTTER Devi Use as directed   gabapentin 300 MG capsule Commonly known as:  NEURONTIN Take 300 mg by mouth 2 (two) times daily. What changed:  Another medication with the same name was removed. Continue taking this medication, and follow the directions you see here.   hydrocortisone 2.5 % rectal cream Commonly known as:  ANUSOL-HC APPLY AS NEEDED AS DIRECTED What changed:  See the new instructions.   LACTAID FAST ACT 9000 units Tabs Generic drug:  Lactase Take 18,000 Units by mouth 4 (four) times daily as needed (dairy consumption).   lidocaine 5 % Commonly known as:  LIDODERM Place 1 patch onto the skin daily. Remove & Discard patch within 12 hours or as directed by MD What changed:    when to take this  reasons to take this   mirtazapine 30 MG disintegrating tablet Commonly known as:  REMERON SOL-TAB DISOLVE 1 TABLET BY MOUTH DAILY AT BEDTIME What changed:  See the new instructions.   mometasone 50 MCG/ACT nasal spray Commonly known as:  NASONEX Place 2 sprays into the nose 2 (two) times daily. What changed:  when to take this   mometasone-formoterol 200-5 MCG/ACT Aero Commonly known as:  DULERA Inhale 2 puffs into the lungs 2 (two) times daily. What changed:  how much to take   montelukast 10 MG tablet Commonly known as:  SINGULAIR Take 1 tablet (10 mg total) by mouth daily.   mupirocin ointment 2 % Commonly known as:  BACTROBAN APPLY IN THE NOSE 3 TIMES DAILY AS  NEEDED What changed:    how much to take  how to take this  when to take this  additional instructions   nystatin 100000 UNIT/ML suspension Commonly known as:  MYCOSTATIN USE AS DIRECTED 5MLS IN MOUTH OR THROAT 4 TIMES DAILY What changed:  See the new instructions.   nystatin-triamcinolone ointment Commonly known as:  MYCOLOG Apply 1 application topically 3 (three) times daily as needed (yeast infection).   PRESCRIPTION MEDICATION Place 2 puffs into both ears once a week. Place capsule in dispenser and instill 2 puffs in each ear once a week after washing hair. CS (X) AHCL    CHLORAMP/SULFU/AMPHO B/ H DROC 100-100-5 mg capsule - compounded at [facility]  pseudoephedrine-guaifenesin 60-600 MG 12 hr tablet Commonly known as:  MUCINEX D Take 1 tablet by mouth 2 (two) times daily.   ranitidine 300 MG tablet Commonly known as:  ZANTAC Take 1 tablet (300 mg total) by mouth daily. What changed:  when to take this   Rivaroxaban 15 MG Tabs tablet Commonly known as:  XARELTO Take 1 tablet (15 mg total) by mouth 2 (two) times daily with a meal for 21 days.   rivaroxaban 20 MG Tabs tablet Commonly known as:  XARELTO Take 1 tablet (20 mg total) by mouth daily with supper. Start taking on:  01/05/2018   SIMPLY SALINE NA Place 1 spray into both nostrils at bedtime.   SOOTHE OP Place 1 drop into both eyes 2 (two) times daily as needed (dry eyes).   Tiotropium Bromide Monohydrate 2.5 MCG/ACT Aers Inhale 2 puffs into the lungs daily.   VINATE M 27-1 MG Tabs Take 1 tablet by mouth daily.   Vitamin D3 10000 units Tabs Take 1,000 Units by mouth daily.        Discharge Assessment: Vitals:   12/24/17 0833 12/24/17 1435  BP:  140/72  Pulse:  89  Resp:  17  Temp:    SpO2: 96% 95%   Skin clean, dry and intact without evidence of skin break down, no evidence of skin tears noted. IV catheter discontinued intact. Site without signs and symptoms of complications - no  redness or edema noted at insertion site, patient denies c/o pain - only slight tenderness at site.  Dressing with slight pressure applied.  D/c Instructions-Education: Discharge instructions given to patient/family with verbalized understanding. D/c education completed with patient/family including follow up instructions, medication list, d/c activities limitations if indicated, with other d/c instructions as indicated by MD - patient able to verbalize understanding, all questions fully answered. Patient instructed to return to ED, call 911, or call MD for any changes in condition.  Patient discharging to SNF. Transport via [facility]. Report given to [facility], RN 12/24/2017 4:35 PM

## 2017-12-24 NOTE — Progress Notes (Signed)
[place] has insurance approval to admit patient today. RN notified.  Percell Locus Caldwell Kronenberger LCSW [phone]

## 2017-12-24 NOTE — Progress Notes (Signed)
Physical Therapy Treatment Patient Details Name: Ebony Scott MRN: [id number] DOB: April 16, 1950 Today's Date: 12/24/2017    History of Present Illness PT is a 67 y.o. female with a hx of anxiety, depression, asthma, breast cancer, chronic back pain, chronic neck pain, DVT, fibromyalgia, and Graves' disease, who was found down by her daughter confused and soiled w/ vomit and feces on 12/13/2017. Last know to be well time 12/11/2017. CT negative for intracranial pathology.  Found with R LE DVT and small B PE, SIRS and low grade rhabdomyolysis per internal medicine.     PT Comments    Pt tolerated treatment well. Therapist applied a brief on pt prior to gait training. Pt was able to ambulate 25 ft with Mod A +2 with a RW this session. Pt required VC/TC for safety with AD and maintaining upright posture. Pt was very motivated and appeared to be feeling better than pervious sessions. NT was called at end of session for purewick placement. Pt would benefit from continued PT in order to maximize function. Pt remains appropriate for CIR based on current functional status particularly with progression to gt training.    Follow Up Recommendations  CIR     Equipment Recommendations  Rolling walker with 5" wheels    Recommendations for Other Services Rehab consult     Precautions / Restrictions Precautions Precautions: Fall Precaution Comments:  Hesitant to move/WB LLE Restrictions Weight Bearing Restrictions: No    Mobility  Bed Mobility Overal bed mobility: Needs Assistance Bed Mobility: Rolling Rolling: Min assist Sidelying to sit: Min assist;HOB elevated       General bed mobility comments: Pt in bed upon arrival. Required VC for hand placement and assist advancing LE off EOB  Transfers Overall transfer level: Needs assistance Equipment used: Rolling walker (2 wheeled) Transfers: Sit to/from Stand Sit to Stand: Mod assist;+2 physical assistance         General transfer comment:   pt required Mod A + 2 to prevent LOB. Pt requiring vc for safety with AD.  Ambulation/Gait Ambulation/Gait assistance: Mod assist;+2 physical assistance;+2 safety/equipment Gait Distance (Feet): 25 Feet Assistive device: Rolling walker (2 wheeled) Gait Pattern/deviations: Decreased stride length;Trunk flexed;Decreased dorsiflexion - right;Decreased dorsiflexion - left;Antalgic;Decreased stance time - right;Decreased stance time - left;Step-to pattern;Decreased step length - right;Decreased step length - left;Narrow base of support     General Gait Details: multimodal cues for posture and safe use of AD. Close chair follow due to pt's fatigue and SOB. Pt very motivated to particpate this session.   Stairs             Wheelchair Mobility    Modified Rankin (Stroke Patients Only)       Balance Overall balance assessment: Needs assistance Sitting-balance support: Bilateral upper extremity supported;Feet supported Sitting balance-Leahy Scale: Fair Sitting balance - Comments: required min gaurd to maintain sitting balance on EOB  Postural control: Right lateral lean Standing balance support: Bilateral upper extremity supported Standing balance-Leahy Scale: Poor Standing balance comment: Mod A +2 to maintain balance with RW                            Cognition Arousal/Alertness: Awake/alert Behavior During Therapy: WFL for tasks assessed/performed Overall Cognitive Status: Within Functional Limits for tasks assessed  General Comments: Pt was very motivated this session      Exercises Total Joint Exercises Quad Sets: AROM;10 reps;Right;Left;Seated Gluteal Sets: AROM;10 reps;Right;Left;Seated Hip ABduction/ADduction: AROM;10 reps;Right;Left;Seated Long Arc Quad: AROM;10 reps;Seated;Right;Left Marching in Standing: AROM;10 reps;Seated;Right;Left    General Comments        Pertinent Vitals/Pain Pain Assessment:  No/denies pain Faces Pain Scale: Hurts a little bit Pain Location: L foot Pain Descriptors / Indicators: Discomfort;Sore Pain Intervention(s): Limited activity within patient's tolerance;Monitored during session    Home Living                      Prior Function            PT Goals (current goals can now be found in the care plan section) Acute Rehab PT Goals Patient Stated Goal: To feel better.  PT Goal Formulation: With patient/family Time For Goal Achievement: 12/29/17 Potential to Achieve Goals: Fair    Frequency    Min 4X/week      PT Plan Current plan remains appropriate    Co-evaluation              AM-PAC PT "6 Clicks" Daily Activity  Outcome Measure  Difficulty turning over in bed (including adjusting bedclothes, sheets and blankets)?: A Lot Difficulty moving from lying on back to sitting on the side of the bed? : A Lot Difficulty sitting down on and standing up from a chair with arms (e.g., wheelchair, bedside commode, etc,.)?: A Lot Help needed moving to and from a bed to chair (including a wheelchair)?: A Lot Help needed walking in hospital room?: A Lot Help needed climbing 3-5 steps with a railing? : Total 6 Click Score: 11    End of Session Equipment Utilized During Treatment: Gait belt Activity Tolerance: Patient tolerated treatment well Patient left: in chair;with chair alarm set;with call bell/phone within reach;with nursing/sitter in room Nurse Communication: Mobility status PT Visit Diagnosis: Unsteadiness on feet (R26.81);Other abnormalities of gait and mobility (R26.89);Muscle weakness (generalized) (M62.81);History of falling (Z91.81);Pain Pain - Right/Left: Left Pain - part of body: Ankle and joints of foot     Time: 0912-0935 PT Time Calculation (min) (ACUTE ONLY): 23 min  Charges:  $Gait Training: 8-22 mins $Therapeutic Activity: 8-22 mins                     [address] SPTA   [place] 12/24/2017, 1:06  PM

## 2018-01-03 ENCOUNTER — Ambulatory Visit: Payer: Medicare Other | Admitting: Pulmonary Disease

## 2018-01-03 ENCOUNTER — Ambulatory Visit: Payer: Medicare Other | Admitting: Family Medicine

## 2018-01-08 ENCOUNTER — Encounter: Payer: Self-pay | Admitting: Family Medicine

## 2018-01-08 ENCOUNTER — Other Ambulatory Visit: Payer: Self-pay | Admitting: Internal Medicine

## 2018-01-08 NOTE — Telephone Encounter (Signed)
May I refill Sir? 

## 2018-01-10 ENCOUNTER — Telehealth: Payer: Self-pay | Admitting: Pulmonary Disease

## 2018-01-10 NOTE — Telephone Encounter (Signed)
Per Dr Gessner okay to refill. 

## 2018-01-10 NOTE — Telephone Encounter (Signed)
I'll call Ebony Scott back Mon.Marland Kitchen

## 2018-01-14 ENCOUNTER — Encounter: Payer: Self-pay | Admitting: Pulmonary Disease

## 2018-01-14 ENCOUNTER — Observation Stay (HOSPITAL_COMMUNITY): Payer: Medicare Other

## 2018-01-14 ENCOUNTER — Other Ambulatory Visit: Payer: Self-pay

## 2018-01-14 ENCOUNTER — Telehealth: Payer: Self-pay | Admitting: Family Medicine

## 2018-01-14 ENCOUNTER — Inpatient Hospital Stay (HOSPITAL_COMMUNITY)
Admission: EM | Admit: 2018-01-14 | Discharge: 2018-01-23 | DRG: 371 | Disposition: A | Discharge status: Skilled Nursing Facility | Payer: Medicare Other | Attending: Internal Medicine | Admitting: Internal Medicine

## 2018-01-14 ENCOUNTER — Ambulatory Visit: Payer: Medicare Other | Admitting: Allergy and Immunology

## 2018-01-14 ENCOUNTER — Emergency Department (HOSPITAL_COMMUNITY): Payer: Medicare Other

## 2018-01-14 DIAGNOSIS — Z86010 Personal history of colonic polyps: Secondary | ICD-10-CM

## 2018-01-14 DIAGNOSIS — R17 Unspecified jaundice: Secondary | ICD-10-CM | POA: Diagnosis present

## 2018-01-14 DIAGNOSIS — M797 Fibromyalgia: Secondary | ICD-10-CM | POA: Diagnosis present

## 2018-01-14 DIAGNOSIS — M545 Low back pain: Secondary | ICD-10-CM | POA: Diagnosis present

## 2018-01-14 DIAGNOSIS — R059 Cough, unspecified: Secondary | ICD-10-CM

## 2018-01-14 DIAGNOSIS — E739 Lactose intolerance, unspecified: Secondary | ICD-10-CM | POA: Diagnosis present

## 2018-01-14 DIAGNOSIS — Z853 Personal history of malignant neoplasm of breast: Secondary | ICD-10-CM

## 2018-01-14 DIAGNOSIS — M25559 Pain in unspecified hip: Secondary | ICD-10-CM

## 2018-01-14 DIAGNOSIS — F32A Depression, unspecified: Secondary | ICD-10-CM | POA: Diagnosis present

## 2018-01-14 DIAGNOSIS — K219 Gastro-esophageal reflux disease without esophagitis: Secondary | ICD-10-CM | POA: Diagnosis present

## 2018-01-14 DIAGNOSIS — E43 Unspecified severe protein-calorie malnutrition: Secondary | ICD-10-CM | POA: Diagnosis present

## 2018-01-14 DIAGNOSIS — R402252 Coma scale, best verbal response, oriented, at arrival to emergency department: Secondary | ICD-10-CM | POA: Diagnosis present

## 2018-01-14 DIAGNOSIS — N183 Chronic kidney disease, stage 3 unspecified: Secondary | ICD-10-CM | POA: Diagnosis present

## 2018-01-14 DIAGNOSIS — I5042 Chronic combined systolic (congestive) and diastolic (congestive) heart failure: Secondary | ICD-10-CM | POA: Diagnosis present

## 2018-01-14 DIAGNOSIS — S92352D Displaced fracture of fifth metatarsal bone, left foot, subsequent encounter for fracture with routine healing: Secondary | ICD-10-CM

## 2018-01-14 DIAGNOSIS — Z880 Allergy status to penicillin: Secondary | ICD-10-CM

## 2018-01-14 DIAGNOSIS — I5022 Chronic systolic (congestive) heart failure: Secondary | ICD-10-CM | POA: Diagnosis present

## 2018-01-14 DIAGNOSIS — A0472 Enterocolitis due to Clostridium difficile, not specified as recurrent: Secondary | ICD-10-CM | POA: Diagnosis not present

## 2018-01-14 DIAGNOSIS — R402142 Coma scale, eyes open, spontaneous, at arrival to emergency department: Secondary | ICD-10-CM | POA: Diagnosis present

## 2018-01-14 DIAGNOSIS — E785 Hyperlipidemia, unspecified: Secondary | ICD-10-CM | POA: Diagnosis present

## 2018-01-14 DIAGNOSIS — Z9842 Cataract extraction status, left eye: Secondary | ICD-10-CM

## 2018-01-14 DIAGNOSIS — D539 Nutritional anemia, unspecified: Secondary | ICD-10-CM | POA: Diagnosis present

## 2018-01-14 DIAGNOSIS — S92351A Displaced fracture of fifth metatarsal bone, right foot, initial encounter for closed fracture: Secondary | ICD-10-CM | POA: Diagnosis present

## 2018-01-14 DIAGNOSIS — M19042 Primary osteoarthritis, left hand: Secondary | ICD-10-CM | POA: Diagnosis present

## 2018-01-14 DIAGNOSIS — Z91048 Other nonmedicinal substance allergy status: Secondary | ICD-10-CM

## 2018-01-14 DIAGNOSIS — Z8249 Family history of ischemic heart disease and other diseases of the circulatory system: Secondary | ICD-10-CM

## 2018-01-14 DIAGNOSIS — R5381 Other malaise: Secondary | ICD-10-CM | POA: Diagnosis present

## 2018-01-14 DIAGNOSIS — I82409 Acute embolism and thrombosis of unspecified deep veins of unspecified lower extremity: Secondary | ICD-10-CM | POA: Diagnosis present

## 2018-01-14 DIAGNOSIS — Z801 Family history of malignant neoplasm of trachea, bronchus and lung: Secondary | ICD-10-CM

## 2018-01-14 DIAGNOSIS — J3089 Other allergic rhinitis: Secondary | ICD-10-CM

## 2018-01-14 DIAGNOSIS — I5032 Chronic diastolic (congestive) heart failure: Secondary | ICD-10-CM | POA: Diagnosis present

## 2018-01-14 DIAGNOSIS — Z6825 Body mass index (BMI) 25.0-25.9, adult: Secondary | ICD-10-CM

## 2018-01-14 DIAGNOSIS — Z96651 Presence of right artificial knee joint: Secondary | ICD-10-CM | POA: Diagnosis present

## 2018-01-14 DIAGNOSIS — Z7901 Long term (current) use of anticoagulants: Secondary | ICD-10-CM

## 2018-01-14 DIAGNOSIS — E869 Volume depletion, unspecified: Secondary | ICD-10-CM | POA: Diagnosis present

## 2018-01-14 DIAGNOSIS — Z981 Arthrodesis status: Secondary | ICD-10-CM

## 2018-01-14 DIAGNOSIS — N1832 Chronic kidney disease, stage 3b: Secondary | ICD-10-CM | POA: Diagnosis present

## 2018-01-14 DIAGNOSIS — F329 Major depressive disorder, single episode, unspecified: Secondary | ICD-10-CM | POA: Diagnosis present

## 2018-01-14 DIAGNOSIS — Z823 Family history of stroke: Secondary | ICD-10-CM

## 2018-01-14 DIAGNOSIS — M25579 Pain in unspecified ankle and joints of unspecified foot: Secondary | ICD-10-CM | POA: Diagnosis not present

## 2018-01-14 DIAGNOSIS — E039 Hypothyroidism, unspecified: Secondary | ICD-10-CM | POA: Insufficient documentation

## 2018-01-14 DIAGNOSIS — Z86718 Personal history of other venous thrombosis and embolism: Secondary | ICD-10-CM

## 2018-01-14 DIAGNOSIS — M19041 Primary osteoarthritis, right hand: Secondary | ICD-10-CM | POA: Diagnosis present

## 2018-01-14 DIAGNOSIS — G8929 Other chronic pain: Secondary | ICD-10-CM | POA: Diagnosis present

## 2018-01-14 DIAGNOSIS — E0590 Thyrotoxicosis, unspecified without thyrotoxic crisis or storm: Secondary | ICD-10-CM | POA: Diagnosis present

## 2018-01-14 DIAGNOSIS — Z9841 Cataract extraction status, right eye: Secondary | ICD-10-CM

## 2018-01-14 DIAGNOSIS — R05 Cough: Secondary | ICD-10-CM

## 2018-01-14 DIAGNOSIS — K5790 Diverticulosis of intestine, part unspecified, without perforation or abscess without bleeding: Secondary | ICD-10-CM | POA: Diagnosis present

## 2018-01-14 DIAGNOSIS — Z9049 Acquired absence of other specified parts of digestive tract: Secondary | ICD-10-CM

## 2018-01-14 DIAGNOSIS — M810 Age-related osteoporosis without current pathological fracture: Secondary | ICD-10-CM | POA: Diagnosis present

## 2018-01-14 DIAGNOSIS — Z961 Presence of intraocular lens: Secondary | ICD-10-CM | POA: Diagnosis present

## 2018-01-14 DIAGNOSIS — Z833 Family history of diabetes mellitus: Secondary | ICD-10-CM

## 2018-01-14 DIAGNOSIS — Z885 Allergy status to narcotic agent status: Secondary | ICD-10-CM

## 2018-01-14 DIAGNOSIS — Z7951 Long term (current) use of inhaled steroids: Secondary | ICD-10-CM

## 2018-01-14 DIAGNOSIS — Z91013 Allergy to seafood: Secondary | ICD-10-CM

## 2018-01-14 DIAGNOSIS — R402362 Coma scale, best motor response, obeys commands, at arrival to emergency department: Secondary | ICD-10-CM | POA: Diagnosis present

## 2018-01-14 DIAGNOSIS — Z8261 Family history of arthritis: Secondary | ICD-10-CM

## 2018-01-14 DIAGNOSIS — I5043 Acute on chronic combined systolic (congestive) and diastolic (congestive) heart failure: Secondary | ICD-10-CM | POA: Diagnosis present

## 2018-01-14 DIAGNOSIS — Z90710 Acquired absence of both cervix and uterus: Secondary | ICD-10-CM

## 2018-01-14 DIAGNOSIS — M1120 Other chondrocalcinosis, unspecified site: Secondary | ICD-10-CM | POA: Diagnosis present

## 2018-01-14 DIAGNOSIS — W19XXXA Unspecified fall, initial encounter: Secondary | ICD-10-CM | POA: Diagnosis present

## 2018-01-14 DIAGNOSIS — M25552 Pain in left hip: Secondary | ICD-10-CM

## 2018-01-14 DIAGNOSIS — Z881 Allergy status to other antibiotic agents status: Secondary | ICD-10-CM

## 2018-01-14 HISTORY — DX: Enterocolitis due to Clostridium difficile, not specified as recurrent: A04.72

## 2018-01-14 LAB — COMPREHENSIVE METABOLIC PANEL
ALT: 61 U/L — ABNORMAL HIGH (ref 0–44)
AST: 69 U/L — ABNORMAL HIGH (ref 15–41)
Albumin: 3.3 g/dL — ABNORMAL LOW (ref 3.5–5.0)
Alkaline Phosphatase: 70 U/L (ref 38–126)
Anion gap: 12 (ref 5–15)
BUN: 12 mg/dL (ref 8–23)
CO2: 23 mmol/L (ref 22–32)
Calcium: 8.2 mg/dL — ABNORMAL LOW (ref 8.9–10.3)
Chloride: 103 mmol/L (ref 98–111)
Creatinine, Ser: 1.01 mg/dL — ABNORMAL HIGH (ref 0.44–1.00)
GFR calc Af Amer: >60 mL/min (ref >60)
GFR calc non Af Amer: 57 mL/min — ABNORMAL LOW (ref >60)
Glucose, Bld: 108 mg/dL — ABNORMAL HIGH (ref 70–99)
Potassium: 3.5 mmol/L (ref 3.5–5.1)
Sodium: 138 mmol/L (ref 135–145)
Total Bilirubin: 1.8 mg/dL — ABNORMAL HIGH (ref 0.3–1.2)
Total Protein: 7.3 g/dL (ref 6.5–8.1)

## 2018-01-14 LAB — I-STAT CG4 LACTIC ACID, ED
Lactic Acid, Venous: 0.88 mmol/L (ref 0.5–1.9)
Lactic Acid, Venous: 0.48 mmol/L — ABNORMAL LOW (ref 0.5–1.9)

## 2018-01-14 LAB — C DIFFICILE QUICK SCREEN W PCR REFLEX
C Diff antigen: POSITIVE — AB
C Diff toxin: NEGATIVE

## 2018-01-14 LAB — CBC WITH DIFFERENTIAL/PLATELET
Abs Immature Granulocytes: 0.05 10*3/uL (ref 0.00–0.07)
Basophils Absolute: 0 10*3/uL (ref 0.0–0.1)
Basophils Relative: 0 %
Eosinophils Absolute: 0 10*3/uL (ref 0.0–0.5)
Eosinophils Relative: 0 %
HCT: 33.3 % — ABNORMAL LOW (ref 36.0–46.0)
Hemoglobin: 10.3 g/dL — ABNORMAL LOW (ref 12.0–15.0)
Immature Granulocytes: 1 %
Lymphocytes Relative: 16 %
Lymphs Abs: 1.6 10*3/uL (ref 0.7–4.0)
MCH: 32.4 pg (ref 26.0–34.0)
MCHC: 30.9 g/dL (ref 30.0–36.0)
MCV: 104.7 fL — ABNORMAL HIGH (ref 80.0–100.0)
Monocytes Absolute: 1.7 10*3/uL — ABNORMAL HIGH (ref 0.1–1.0)
Monocytes Relative: 18 %
Neutro Abs: 6.1 10*3/uL (ref 1.7–7.7)
Neutrophils Relative %: 65 %
Platelets: 284 10*3/uL (ref 150–400)
RBC: 3.18 MIL/uL — ABNORMAL LOW (ref 3.87–5.11)
RDW: 14.9 % (ref 11.5–15.5)
WBC: 9.5 10*3/uL (ref 4.0–10.5)
nRBC: 0 % (ref 0.0–0.2)

## 2018-01-14 LAB — URINALYSIS, ROUTINE W REFLEX MICROSCOPIC
Bilirubin Urine: NEGATIVE
Glucose, UA: NEGATIVE mg/dL
Hgb urine dipstick: NEGATIVE
Ketones, ur: NEGATIVE mg/dL
Nitrite: NEGATIVE
Protein, ur: 30 mg/dL — AB
Specific Gravity, Urine: 1.014 (ref 1.005–1.030)
pH: 6 (ref 5.0–8.0)

## 2018-01-14 MED ORDER — VANCOMYCIN 50 MG/ML ORAL SOLUTION: 125.0000 mg | Freq: Four times a day (QID) | ORAL | Status: DC

## 2018-01-14 MED ORDER — VANCOMYCIN 50 MG/ML ORAL SOLUTION
125.0000 mg | Freq: Four times a day (QID) | ORAL | Status: DC
  Administered 2018-01-14 – 2018-01-23 (×35): 125 mg via ORAL
  Filled 2018-01-14 (×37): qty 2.5

## 2018-01-14 MED ORDER — ACETAMINOPHEN 325 MG PO TABS
650.0000 mg | ORAL_TABLET | Freq: Once | ORAL | Status: AC
  Administered 2018-01-14: 650 mg via ORAL
  Filled 2018-01-14: qty 2

## 2018-01-14 MED ORDER — ACETAMINOPHEN 325 MG PO TABS
650.0000 mg | ORAL_TABLET | Freq: Four times a day (QID) | ORAL | Status: DC | PRN
  Administered 2018-01-15 – 2018-01-20 (×7): 650 mg via ORAL
  Filled 2018-01-14 (×7): qty 2

## 2018-01-14 MED ORDER — POTASSIUM CHLORIDE IN NACL 20-0.9 MEQ/L-% IV SOLN
INTRAVENOUS | Status: DC
  Administered 2018-01-14 – 2018-01-18 (×6): via INTRAVENOUS
  Filled 2018-01-14 (×6): qty 1000

## 2018-01-14 MED ORDER — SIMETHICONE 80 MG PO CHEW
160.0000 mg | CHEWABLE_TABLET | Freq: Four times a day (QID) | ORAL | Status: DC | PRN
  Administered 2018-01-14: 160 mg via ORAL
  Filled 2018-01-14: qty 2

## 2018-01-14 MED ORDER — ONDANSETRON HCL 4 MG/2ML IJ SOLN
4.0000 mg | Freq: Four times a day (QID) | INTRAMUSCULAR | Status: DC | PRN
  Administered 2018-01-15: 4 mg via INTRAVENOUS
  Filled 2018-01-14: qty 2

## 2018-01-14 MED ORDER — SODIUM CHLORIDE 0.9 % IV SOLN
1000.0000 mL | INTRAVENOUS | Status: DC
  Administered 2018-01-14: 1000 mL via INTRAVENOUS

## 2018-01-14 MED ORDER — ACETAMINOPHEN 650 MG RE SUPP: 650.0000 mg | Freq: Four times a day (QID) | RECTAL | Status: DC | PRN

## 2018-01-14 MED ORDER — ONDANSETRON HCL 4 MG PO TABS: 4.0000 mg | ORAL_TABLET | Freq: Four times a day (QID) | ORAL | Status: DC | PRN

## 2018-01-14 MED ORDER — VANCOMYCIN 50 MG/ML ORAL SOLUTION
125.0000 mg | Freq: Four times a day (QID) | ORAL | Status: DC
  Filled 2018-01-14: qty 2.5

## 2018-01-14 NOTE — Telephone Encounter (Signed)
I called [facility].. I couldn't get through, due to high call volume and a holiday coming up. I looked in th fridge pt has 2 doses on hand. I'll keep trying.

## 2018-01-14 NOTE — Telephone Encounter (Signed)
Tammy, please advise if this has been taken care of for pt and if we can close the open encounter. Thanks!

## 2018-01-14 NOTE — H&P (Signed)
History and Physical    Ebony Scott VCB:[id number] DOB: Apr 09, 1950 DOA: 01/14/2018  PCP: [name], MD   Patient coming from: Home.  I have personally briefly reviewed patient's old medical records in [place]  Chief Complaint: Nausea vomiting and diarrhea.  HPI: Ebony Scott is a 67 y.o. female with medical history significant of allergic rhinitis, seasonal allergies, asthma, anemia, anxiety, depression, osteoarthritis, pseudogout, breast cancer in remission, cataracts, chronic lower back pain, chronic neck pain, bilateral DVTs, diverticulosis, fibromyalgia, GERD, Graves' disease, hyperthyroidism, headache, history of hemorrhoids, IBS, osteoporosis, history of pneumonia who was recently admitted to [facility], discharged to skilled for health care, then recently discharged home from the facility who is coming to the emergency department with complaints of progressively worse weakness, fever, chills, fatigue, decreased appetite, malaise, with multiple episodes diarrhea, nausea and vomiting several x2 days ago.  Frequent sneezing, but no rhinorrhea, sore throat, recent wheezing or hemoptysis.  She gets frequent dry cough which is occasionally productive of whitish sputum.  Denies chest pain, palpitations, diaphoresis, PND orthopnea.  She has had occasional dizziness.  No constipation, melena or hematochezia.  Denies dysuria, frequency or hematuria.  However, she has been urinating less than usual over the past 3 days.  No polyuria, polydipsia, polyphagia or blurred vision.  ED Course: Initial vital signs temperature 100.2 F, pulse 94, respirations 18, blood pressure 133/61 mmHg and O2 sat 97% on room air.  The patient received IV fluids, acetaminophen.  Urinalysis showed hazy appearance, had proteinuria 10 mg/dL, trace leukocyte esterase with 6-10 WBC per hpf and rare bacteria.  Her white count was 9.5, hemoglobin 10.3 g/dL and platelets 284.  CMP showed normal electrolytes 1 her calcium  was corrected to an albumin of 3.3 g/dL.  BUN was 12, creatinine 1.01 and glucose 180 mg/dL.  AST and ALT were mildly elevated at 69 and 61 respectively.  Alkaline phosphatase is normal and total bilirubin was 1.8 mg/dL.  Blood cultures x2 were drawn.  GI panel by PCR is pending.  C. difficile by PCR showed a positive antigen, but C. difficile toxin was negative.  Imaging: her chest radiograph shows a stable mild cardiomegaly without active lung disease.  Left foot x-ray shows incomplete healing of previously diagnosed transverse fracture through the base of the left fifth metatarsal.  Left ankle was negative.  Right foot shows suspected incomplete transverse fracture of the proximal fifth metatarsal shaft.  Right ankle shows diffuse soft tissue edema about the ankle.  Focal soft tissue density anteriorly may be joint effusion or more generalized subcutaneous edema.  There was no osseous abnormality.  Review of Systems: As per HPI otherwise 10 point review of systems negative.   Past Medical History:  Diagnosis Date  . Allergic rhinitis   . Allergy    SEASONAL  . Anemia   . Anxiety    pt denies  . Arthritis    "mostly the hands" (02/20/2012)  . Asthma   . Breast cancer ([place]) 1998   in remission  . Cataract    REMOVED  . Chronic lower back pain   . Chronic neck pain   . Complication of anesthesia    "had hard time waking up from it several times" (02/20/2012)  . Depression    "some; don't take anything for it" (02/20/2012)  . Diverticulosis   . DVT (deep venous thrombosis) ([place])   . Exertional dyspnea   . Fibromyalgia 11/2011  . GERD (gastroesophageal reflux disease)   .  Graves disease   . Headache(784.0)    "related to allergies; more at different times during the year" (02/20/2012)  . Hemorrhoids   . Hiatal hernia    back and neck  . Hx of adenomatous colonic polyps 04/12/2016  . Hypercholesteremia    good cholesterol is high  . Hypothyroidism   . IBS (irritable bowel syndrome)   .  Moderate persistent asthma    -FeV1 72% 2011, -IgE 102 2011, CT sinus Neg 2011  . Osteoporosis    on reclast yearly  . Pneumonia 04/2011; ~ 11/2011   "double; single" (02/20/2012)    Past Surgical History:  Procedure Laterality Date  . ANTERIOR AND POSTERIOR REPAIR  1990's  . APPENDECTOMY    . BREAST LUMPECTOMY  1998   left  . CARPOMETACARPEL ([place]) FUSION OF THUMB WITH AUTOGRAFT FROM RADIUS  ~ 2009   "both thumbs" (02/20/2012)  . CATARACT EXTRACTION W/ INTRAOCULAR LENS  IMPLANT, BILATERAL  2012  . CERVICAL DISCECTOMY  10/2001   C5-C6  . CERVICAL FUSION  2003   C3-C4  . CHOLECYSTECTOMY    . COLONOSCOPY    . DEBRIDEMENT TENNIS ELBOW  ?1970's   right  . ESOPHAGOGASTRODUODENOSCOPY    . HYSTERECTOMY    . KNEE ARTHROPLASTY  ?1990's   "?right; w/cartilage repair" (02/20/2012)  . NASAL SEPTUM SURGERY  1980's  . POSTERIOR CERVICAL FUSION/FORAMINOTOMY  2004   "failed initial fusion; rewired  anterior neck" (02/20/2012)  . TONSILLECTOMY  ~ 1953  . VESICOVAGINAL FISTULA CLOSURE W/ TAH  1988  . VIDEO BRONCHOSCOPY Bilateral 08/23/2016   Procedure: VIDEO BRONCHOSCOPY WITH FLUORO;  Surgeon: Javier Glazier, MD;  Location: [facility];  Service: Cardiopulmonary;  Laterality: Bilateral;     reports that she is a non-smoker but has been exposed to tobacco smoke. She has never used smokeless tobacco. She reports that she drinks alcohol. She reports that she does not use drugs.  Allergies  Allergen Reactions  . Dust Mite Extract Shortness Of Breath and Other (See Comments)    "sneezing" (02/20/2012)  . Molds & Smuts Shortness Of Breath  . Morphine Sulfate Itching  . Other Shortness Of Breath and Other (See Comments)    Grass and weeds "sneezing; filled sinuses" (02/20/2012)  . Penicillins Rash and Other (See Comments)    "welts" (02/20/2012) Has patient had a PCN reaction causing immediate rash, facial/tongue/throat swelling, SOB or lightheadedness with hypotension: Unknown Has patient had a PCN  reaction causing severe rash involving mucus membranes or skin necrosis: No Has patient had a PCN reaction that required hospitalization: No Has patient had a PCN reaction occurring within the last 10 years: No If all of the above answers are "NO", then may proceed with Cephalosporin use.   Marland Kitchen Reclast [Zoledronic Acid] Other (See Comments)    Fever, Put in hospital, dr said it was a reaction from a reaction   . Rofecoxib Swelling    Vioxx REACTION: feet swelling  . Shrimp Flavor Anaphylaxis    ALL SHELLFISH  . Tetracycline Hcl Nausea And Vomiting  . Xolair [Omalizumab] Other (See Comments)    Caused Blood clot  . Dilaudid [Hydromorphone Hcl] Itching  . Hydrocodone-Acetaminophen Nausea And Vomiting  . Levofloxacin Other (See Comments)    REACTION: GI upset  . Oxycodone Hcl Nausea And Vomiting  . Paroxetine Nausea And Vomiting    Paxil   . Celecoxib Swelling    Feet swelling  . Diltiazem Swelling  . Lactose Intolerance (Gi) Other (See Comments)  Bloating and gas  . Tree Extract Other (See Comments)    "tested and told I was allergic to it; never experienced a reaction to it" (02/20/2012)    Family History  Problem Relation Age of Onset  . Allergies Mother   . Heart disease Mother   . Arthritis Mother   . Lung cancer Mother   . Diabetes Mother   . Allergies Father   . Heart disease Father   . Arthritis Father   . Stroke Father   . Colon cancer Unknown        Maternal half aunt/Maternal half uncle  . Colitis Daughter   . Diabetes Maternal Grandfather    Prior to Admission medications   Medication Sig Start Date End Date Taking? Authorizing Provider  acetaminophen (TYLENOL) 650 MG CR tablet Take 1,300 mg by mouth 2 (two) times daily.    Yes [provider]  Alpha-D-Galactosidase (BEANO PO) Take 1 capsule by mouth 3 (three) times daily as needed (gas/ bloating).    Yes [provider]  Alpha-Lipoic Acid 600 MG CAPS Take 600 mg by mouth daily.    Yes  [provider]  azelastine (ASTELIN) 0.1 % nasal spray USE 2 SPRAYS IN EACH NOSTRIL DAILY AS DIRECTED Patient taking differently: Place 2 sprays into both nostrils daily.  07/16/17  Yes Kozlow, Donnamarie Poag, MD  CALCIUM-MAGNESIUM PO Take 1 tablet by mouth at bedtime.   Yes [provider]  cetirizine (ZYRTEC) 10 MG tablet Take 5 mg by mouth every evening.    Yes [provider]  Cholecalciferol (VITAMIN D3) 10000 units TABS Take 1,000 Units by mouth daily.   Yes [provider]  CREON 24000-76000 units CPEP TAKE 1 CAPSULE BY MOUTH THREE TIMES DAILY (WITH MEALS) Patient taking differently: Take 1 capsule by mouth 3 (three) times daily with meals.  02/11/17  Yes [name], MD  cyproheptadine (PERIACTIN) 4 MG tablet TAKE 2 TABLETS BY MOUTH EVERY EVENING Patient taking differently: Take 8 mg by mouth at bedtime.  07/16/17  Yes Kozlow, Donnamarie Poag, MD  dexlansoprazole (DEXILANT) 60 MG capsule Take 1 capsule (60 mg total) by mouth daily. 07/16/17  Yes Kozlow, Donnamarie Poag, MD  diclofenac sodium (VOLTAREN) 1 % GEL Apply 2 g topically 4 (four) times daily. Patient taking differently: Apply 2 g topically 2 (two) times daily.  12/05/17  Yes [name], MD  fluticasone (FLONASE) 50 MCG/ACT nasal spray Place 1 spray into both nostrils daily.  01/08/18  Yes [provider]  gabapentin (NEURONTIN) 300 MG capsule Take 300 mg by mouth 2 (two) times daily.   Yes [provider]  hydrocortisone (ANUSOL-HC) 2.5 % rectal cream APPLY AS NEEDED AS DIRECTED Patient taking differently: Place 1 application rectally as needed for hemorrhoids.  06/28/17  Yes [name], MD  mirtazapine (REMERON SOL-TAB) 30 MG disintegrating tablet DISSOLVE 1 TABLET IN MOUTH EVERY NIGHT AT BEDTIME Patient taking differently: Take 30 mg by mouth at bedtime.  01/10/18  Yes Gatha Mayer, MD  mometasone-formoterol (DULERA) 200-5 MCG/ACT AERO Inhale 2 puffs into the lungs 2 (two) times  daily. 07/16/17  Yes Kozlow, Donnamarie Poag, MD  montelukast (SINGULAIR) 10 MG tablet Take 1 tablet (10 mg total) by mouth daily. 07/16/17  Yes Kozlow, Donnamarie Poag, MD  mupirocin ointment (BACTROBAN) 2 % APPLY IN THE NOSE 3 TIMES DAILY AS NEEDED Patient taking differently: Place 1 application into the nose at bedtime. To prevent nose bleeds 07/16/17  Yes Kozlow,  Donnamarie Poag, MD  nystatin (MYCOSTATIN) 100000 UNIT/ML suspension USE AS DIRECTED 5MLS IN MOUTH OR THROAT 4 TIMES DAILY Patient taking differently: Use as directed 5 mLs in the mouth or throat at bedtime. Swish and spit 10/23/17  Yes [name], MD  omeprazole (PRILOSEC) 20 MG capsule Take 20 mg by mouth daily.  01/08/18  Yes [provider]  Prenatal Vit-Sel-Fe Fum-FA (VINATE M) 27-1 MG TABS Take 1 tablet by mouth daily. 07/26/17  Yes [name], MD  PRESCRIPTION MEDICATION Place 2 puffs into both ears once a week. Place capsule in dispenser and instill 2 puffs in each ear once a week after washing hair. CS (X) AHCL    CHLORAMP/SULFU/AMPHO B/ H DROC 100-100-5 mg capsule - compounded at [place]   Yes [provider]  Probiotic Product (ALIGN) 4 MG CAPS Take 4 mg by mouth every evening.    Yes [provider]  pseudoephedrine-guaifenesin (MUCINEX D) 60-600 MG 12 hr tablet Take 1 tablet by mouth daily.    Yes [provider]  ranitidine (ZANTAC) 300 MG tablet Take 1 tablet (300 mg total) by mouth daily. Patient taking differently: Take 300 mg by mouth every evening.  07/16/17  Yes Kozlow, Donnamarie Poag, MD  rivaroxaban (XARELTO) 20 MG TABS tablet Take 1 tablet (20 mg total) by mouth daily with supper. 01/05/18  Yes Thurnell Lose, MD  SIMPLY SALINE NA Place 1 spray into both nostrils at bedtime.   Yes [provider]  Tiotropium Bromide Monohydrate (SPIRIVA RESPIMAT) 2.5 MCG/ACT AERS Inhale 2 puffs into the lungs daily. 07/16/17  Yes Kozlow, Donnamarie Poag, MD  albuterol (PROAIR HFA) 108 (90 Base) MCG/ACT inhaler Inhale  2 puffs into the lungs every 4 (four) hours as needed for wheezing or shortness of breath. 02/18/17   [name], MD  atorvastatin (LIPITOR) 40 MG tablet Take 1 tablet (40 mg total) by mouth daily. 08/19/17   [name], MD  Colchicine 0.6 MG CAPS TAKE 1 CAPSULE BY MOUTH DAILY AS NEEDED Patient not taking: No sig reported 11/25/17   [name], MD  denosumab (PROLIA) 60 MG/ML SOLN injection Inject 60 mg into the skin every 6 (six) months. Administer in upper arm, thigh, or abdomen    [provider]  EPINEPHrine (EPIPEN 2-PAK) 0.3 mg/0.3 mL IJ SOAJ injection Inject 0.3 mLs (0.3 mg total) into the muscle Once PRN for up to 1 dose. Patient taking differently: Inject 0.3 mg into the muscle once as needed (severe allergic reaction).  12/02/17   Juanito Doom, MD  feeding supplement, GLUCERNA SHAKE, (GLUCERNA SHAKE) LIQD Take 237 mLs by mouth 3 (three) times daily between meals. Patient not taking: Reported on 01/14/2018 12/23/17   Thurnell Lose, MD  Lactase (LACTAID FAST ACT) 9000 units TABS Take 18,000 Units by mouth 4 (four) times daily as needed (dairy consumption).     [provider]  lidocaine (LIDODERM) 5 % Place 1 patch onto the skin daily. Remove & Discard patch within 12 hours or as directed by MD Patient taking differently: Place 1 patch onto the skin daily as needed (pain). Remove & Discard patch within 12 hours or as directed by MD 08/21/16   [name], MD  mometasone (NASONEX) 50 MCG/ACT nasal spray Place 2 sprays into the nose 2 (two) times daily. Patient taking differently: Place 2 sprays into the nose daily.  05/22/17   Juanito Doom, MD  nystatin-triamcinolone ointment Lilyan Gilford) Apply 1  application topically 3 (three) times daily as needed (yeast infection).     [provider]  Propylene Glycol-Glycerin (SOOTHE OP) Place 1 drop into both eyes 2 (two) times daily as needed (dry eyes).     [provider]  Respiratory  Therapy Supplies (FLUTTER) DEVI Use as directed 10/22/14   Elsie Stain, MD  Rivaroxaban (XARELTO) 15 MG TABS tablet Take 1 tablet (15 mg total) by mouth 2 (two) times daily with a meal for 21 days. 12/15/17 01/05/18  Thurnell Lose, MD  Spacer/Aero-Holding Chambers (AEROCHAMBER MV) inhaler Use as instructed 01/23/16   Javier Glazier, MD    Physical Exam: Vitals:   01/14/18 1800 01/14/18 1858 01/14/18 1900 01/14/18 1930  BP: 139/60  (!) 117/58 (!) 119/59  Pulse: 93  90 85  Resp: 20   16  Temp:  100.1 F (37.8 C)    TempSrc:  Oral    SpO2: 97%  97% 97%  Weight:      Height:        Constitutional: Looks acutely ill, but currently in NAD Eyes: PERRL, lids and conjunctivae are mildly injected. ENMT: Mucous membranes are dry. Posterior pharynx and some oral mucosa is showing whitish plaque.  No pharyngeal erythema. Neck: normal, supple, no masses, no thyromegaly Respiratory: Decreased breath sounds on bases, but otherwise clear to auscultation bilaterally, no wheezing, no crackles. Normal respiratory effort. No accessory muscle use.  Cardiovascular: Regular rate and rhythm, no murmurs / rubs / gallops. No extremity edema. 2+ pedal pulses. No carotid bruits.  Abdomen: Soft, mild diffuse tenderness, no guarding or rebound, no masses palpated. No hepatosplenomegaly. Bowel sounds positive.  Musculoskeletal: no clubbing / cyanosis.  Positive tenderness dorsal aspect of both feet with tenderness with passive ROM, right > left, no contractures. Normal muscle tone.  Skin: no rashes, lesions, ulcers on limited dermatological examination. Neurologic: CN 2-12 grossly intact. Sensation intact, DTR normal.  Generalized weakness.  Psychiatric: Normal judgment and insight. Alert and oriented x 3. Normal mood.   Labs on Admission: I have personally reviewed following labs and imaging studies  CBC: Recent Labs  Lab 01/14/18 1707  WBC 9.5  NEUTROABS 6.1  HGB 10.3*  HCT 33.3*  MCV 104.7*    PLT 161   Basic Metabolic Panel: Recent Labs  Lab 01/14/18 1707  NA 138  K 3.5  CL 103  CO2 23  GLUCOSE 108*  BUN 12  CREATININE 1.01*  CALCIUM 8.2*   GFR: Estimated Creatinine Clearance: 43.8 mL/min (A) (by C-G formula based on SCr of 1.01 mg/dL (H)). Liver Function Tests: Recent Labs  Lab 01/14/18 1707  AST 69*  ALT 61*  ALKPHOS 70  BILITOT 1.8*  PROT 7.3  ALBUMIN 3.3*   No results for input(s): LIPASE, AMYLASE in the last 168 hours. No results for input(s): AMMONIA in the last 168 hours. Coagulation Profile: No results for input(s): INR, PROTIME in the last 168 hours. Cardiac Enzymes: No results for input(s): CKTOTAL, CKMB, CKMBINDEX, TROPONINI in the last 168 hours. BNP (last 3 results) No results for input(s): PROBNP in the last 8760 hours. HbA1C: No results for input(s): HGBA1C in the last 72 hours. CBG: No results for input(s): GLUCAP in the last 168 hours. Lipid Profile: No results for input(s): CHOL, HDL, LDLCALC, TRIG, CHOLHDL, LDLDIRECT in the last 72 hours. Thyroid Function Tests: No results for input(s): TSH, T4TOTAL, FREET4, T3FREE, THYROIDAB in the last 72 hours. Anemia Panel: No results for input(s): VITAMINB12, FOLATE, FERRITIN, TIBC,  IRON, RETICCTPCT in the last 72 hours. Urine analysis:    Component Value Date/Time   COLORURINE YELLOW 01/14/2018 1845   APPEARANCEUR HAZY (A) 01/14/2018 1845   LABSPEC 1.014 01/14/2018 1845   PHURINE 6.0 01/14/2018 1845   GLUCOSEU NEGATIVE 01/14/2018 1845   HGBUR NEGATIVE 01/14/2018 1845   BILIRUBINUR NEGATIVE 01/14/2018 1845   KETONESUR NEGATIVE 01/14/2018 1845   PROTEINUR 30 (A) 01/14/2018 1845   UROBILINOGEN 0.2 05/17/2014 1113   NITRITE NEGATIVE 01/14/2018 1845   LEUKOCYTESUR TRACE (A) 01/14/2018 1845    Radiological Exams on Admission: Dg Chest 2 View  Result Date: 01/14/2018 CLINICAL DATA:  Weakness over the last 4 days, some shortness of breath, history of breast carcinoma diagnosed in 1998  EXAM: CHEST - 2 VIEW COMPARISON:  Chest x-ray 12/17/2017 FINDINGS: No active infiltrate is seen and there is no evidence of pleural effusion. Mediastinal and hilar contours are unremarkable. Mild cardiomegaly is stable. No acute bony abnormality is seen. IMPRESSION: Stable mild cardiomegaly.  No active lung disease. Electronically Signed   By: Ivar Drape M.D.   On: 01/14/2018 17:09   Dg Ankle Complete Left  Result Date: 01/14/2018 CLINICAL DATA:  Weakness over 4 days, left foot and ankle pain EXAM: LEFT ANKLE COMPLETE - 3+ VIEW COMPARISON:  Left ankle films of 12/16/2016 FINDINGS: The left ankle joint appears normal. Alignment is normal. No acute abnormality is seen. Again the nonunited fracture through the base of the left fifth metatarsal is present. IMPRESSION: Negative left ankle. Ninety night and fracture through the base of the left fifth metatarsal. Electronically Signed   By: Ivar Drape M.D.   On: 01/14/2018 17:12   Dg Foot Complete Left  Result Date: 01/14/2018 CLINICAL DATA:  Weakness over the last 4 days, left foot and ankle pain, history of breast carcinoma diagnosed in 1998 EXAM: LEFT FOOT - COMPLETE 3+ VIEW COMPARISON:  Left foot films of 11/06/2016 FINDINGS: There is incomplete healing of the previously noted transverse fracture through the base of the left fifth metatarsal. The fracture line is still somewhat visible. No acute fracture is seen. Alignment is normal. No erosion is seen. IMPRESSION: Incomplete healing of the previously diagnosed transverse fracture through the base of the left fifth metatarsal. Electronically Signed   By: Ivar Drape M.D.   On: 01/14/2018 17:10   08/05/2017 echocardiogram complete  ------------------------------------------------------------------- LV EF: 65% -   70%  ------------------------------------------------------------------- Indications:      SOB (R06.02).  ------------------------------------------------------------------- History:    PMH:  Abnormal PFT.  ------------------------------------------------------------------- Study Conclusions  - Left ventricle: The cavity size was normal. There was mild focal   basal hypertrophy of the septum. Systolic function was vigorous.   The estimated ejection fraction was in the range of 65% to 70%.   Wall motion was normal; there were no regional wall motion   abnormalities. Doppler parameters are consistent with abnormal   left ventricular relaxation (grade 1 diastolic dysfunction).  Impressions:  - Definity used; vigorous LV systolic function; mild diastolic   dysfunction.   EKG: Independently reviewed.  Vent. rate 93 BPM PR interval * ms QRS duration 79 ms QT/QTc 336/418 ms P-R-T axes 75 39 58 Sinus rhythm Probably anterior infarct, old  Assessment/Plan Principal Problem:   Clostridium difficile colitis   Clostridium difficile diarrhea Observation/MedSurg. Continue IV fluids. Vancomycin 125 mg p.o. 4 times a day. Continue home probiotics. Avoid opiates or anything else that we will slow GI transit.  Active Problems:   Closed fracture of fifth  metatarsal bone of right foot, initial encounter Will consult Orthopedic surgery in AM.      Chronic diastolic CHF (congestive heart failure) ([place]) No signs of decompensation at this time as the patient is actually volume depleted. Monitor daily weights, intake and output.    CKD (chronic kidney disease), stage III (HCC) Continue IV fluids. Monitor intake and output. Follow-up renal function electrolytes.    DVT (deep venous thrombosis) (HCC) On Xarelto.    Depression Continue Remeron 30 mg at bedtime, use as needed    Hyperlipidemia Statin is currently on hold due to weakness.    GERD Famotidine 20 mg p.o. twice daily once more alert. Protonix 40 mg p.o. daily once more alert.    Pseudogout Colchicine as needed.    Hyperthyroidism No longer on methimazole. Follow-up TSH as needed.     Hyperbilirubinemia Last month had RUQ ultrasound. Continue IV fluids and follow-up bilirubin level.   DVT prophylaxis: SCDs. Code Status: Full code. Family Communication: Her daughter was present in the ED room. Disposition Plan: Observation for IV hydration and C. difficile treatment. Consults called: Admission status: Observation/MedSurg.   Reubin Milan MD [facility] Pager [phone].  If 7PM-7AM, please contact night-coverage [email or web address] Password [facility]  01/14/2018, 9:41 PM

## 2018-01-14 NOTE — ED Provider Notes (Signed)
[facility] DEPT Provider Note   CSN: [id number] Arrival date & time: 01/14/18  1617     History   Chief Complaint Chief Complaint  Patient presents with  . Weakness    HPI Ebony Scott is a 67 y.o. female.  67 year old female presents with several days of generalized weakness with some associated pain to her left foot and ankle.  Had a fall several days ago and injured her right foot.  Has had pain characterizes sharp and worse with ambulation.  Also noted today she had a fever of 101.1.  She is had diarrhea times several weeks without an etiology.  Had antibiotics earlier this month she was hospitalized.  She notes increasing cough congestion with mild dyspnea.  Diarrhea described as being orange.  She has had diffuse abdominal cramping associated with her diarrhea and some increased flatus.  Denies any emesis.  Called EMS and received 1/2 L bolus of saline and transported here.     Past Medical History:  Diagnosis Date  . Allergic rhinitis   . Allergy    SEASONAL  . Anemia   . Anxiety    pt denies  . Arthritis    "mostly the hands" (02/20/2012)  . Asthma   . Breast cancer ([place]) 1998   in remission  . Cataract    REMOVED  . Chronic lower back pain   . Chronic neck pain   . Complication of anesthesia    "had hard time waking up from it several times" (02/20/2012)  . Depression    "some; don't take anything for it" (02/20/2012)  . Diverticulosis   . DVT (deep venous thrombosis) ([place])   . Exertional dyspnea   . Fibromyalgia 11/2011  . GERD (gastroesophageal reflux disease)   . Graves disease   . Headache(784.0)    "related to allergies; more at different times during the year" (02/20/2012)  . Hemorrhoids   . Hiatal hernia    back and neck  . Hx of adenomatous colonic polyps 04/12/2016  . Hypercholesteremia    good cholesterol is high  . Hypothyroidism   . IBS (irritable bowel syndrome)   . Moderate persistent asthma    -FeV1 72% 2011,  -IgE 102 2011, CT sinus Neg 2011  . Osteoporosis    on reclast yearly  . Pneumonia 04/2011; ~ 11/2011   "double; single" (02/20/2012)    Patient Active Problem List   Diagnosis Date Noted  . Debility   . Acute left ankle pain   . Malnutrition of moderate degree 12/17/2017  . Knee pain   . History of breast cancer   . Chronic pain syndrome   . Fibromyalgia   . Graves disease   . Leukocytosis   . Sepsis due to undetermined organism ([place]) 12/13/2017  . Rhabdomyolysis 12/13/2017  . Acute deep vein thrombosis (DVT) of right lower extremity ([place]) 12/13/2017  . Multiple lung nodules on CT   . CKD (chronic kidney disease), stage III ([place]) 04/15/2016  . Hx of adenomatous colonic polyps 04/12/2016  . Cough 02/09/2016  . Opacity of lung on imaging study 11/03/2015  . Severe persistent asthma 02/08/2015  . Facial pain syndrome 02/08/2015  . Insomnia 02/08/2015  . Other allergic rhinitis 02/08/2015  . LPRD (laryngopharyngeal reflux disease) 02/08/2015  . Chronic diastolic CHF (congestive heart failure) ([place]) 02/08/2015  . Nausea without vomiting 04/08/2014  . Benign paroxysmal positional vertigo 12/03/2012  . Dizziness and giddiness 10/29/2012  . Pseudogout 02/24/2012  . Pneumonia 05/31/2011  .  IRRITABLE BOWEL SYNDROME 01/02/2010  . Hyperlipidemia 12/23/2009  . Hyperthyroidism 11/12/2006  . Seasonal and perennial allergic rhinitis 11/12/2006  . GERD 11/12/2006  . NECK PAIN, CHRONIC 11/12/2006  . OSTEOPOROSIS 11/12/2006  . BREAST CANCER, HX OF 11/12/2006    Past Surgical History:  Procedure Laterality Date  . ANTERIOR AND POSTERIOR REPAIR  1990's  . APPENDECTOMY    . BREAST LUMPECTOMY  1998   left  . CARPOMETACARPEL ([place]) FUSION OF THUMB WITH AUTOGRAFT FROM RADIUS  ~ 2009   "both thumbs" (02/20/2012)  . CATARACT EXTRACTION W/ INTRAOCULAR LENS  IMPLANT, BILATERAL  2012  . CERVICAL DISCECTOMY  10/2001   C5-C6  . CERVICAL FUSION  2003   C3-C4  . CHOLECYSTECTOMY    . COLONOSCOPY     . DEBRIDEMENT TENNIS ELBOW  ?1970's   right  . ESOPHAGOGASTRODUODENOSCOPY    . HYSTERECTOMY    . KNEE ARTHROPLASTY  ?1990's   "?right; w/cartilage repair" (02/20/2012)  . NASAL SEPTUM SURGERY  1980's  . POSTERIOR CERVICAL FUSION/FORAMINOTOMY  2004   "failed initial fusion; rewired  anterior neck" (02/20/2012)  . TONSILLECTOMY  ~ 1953  . VESICOVAGINAL FISTULA CLOSURE W/ TAH  1988  . VIDEO BRONCHOSCOPY Bilateral 08/23/2016   Procedure: VIDEO BRONCHOSCOPY WITH FLUORO;  Surgeon: Javier Glazier, MD;  Location: [facility];  Service: Cardiopulmonary;  Laterality: Bilateral;     OB History   None      Home Medications    Prior to Admission medications   Medication Sig Start Date End Date Taking? Authorizing Provider  acetaminophen (TYLENOL) 650 MG CR tablet Take 1,300 mg by mouth 2 (two) times daily.     [provider]  albuterol (PROAIR HFA) 108 (90 Base) MCG/ACT inhaler Inhale 2 puffs into the lungs every 4 (four) hours as needed for wheezing or shortness of breath. 02/18/17   [name], MD  Alpha-D-Galactosidase (BEANO PO) Take 1 capsule by mouth 3 (three) times daily as needed (gas/ bloating).     [provider]  Alpha-Lipoic Acid 600 MG CAPS Take 600 mg by mouth daily.     [provider]  atorvastatin (LIPITOR) 40 MG tablet Take 1 tablet (40 mg total) by mouth daily. 08/19/17   [name], MD  azelastine (ASTELIN) 0.1 % nasal spray USE 2 SPRAYS IN EACH NOSTRIL DAILY AS DIRECTED Patient taking differently: Place 2 sprays into both nostrils daily.  07/16/17   Kozlow, Donnamarie Poag, MD  CALCIUM-MAGNESIUM PO Take 1 tablet by mouth at bedtime.    [provider]  cetirizine (ZYRTEC) 10 MG tablet Take 5 mg by mouth every evening.     [provider]  Cholecalciferol (VITAMIN D3) 10000 units TABS Take 1,000 Units by mouth daily.    [provider]  Colchicine 0.6 MG CAPS TAKE 1 CAPSULE BY MOUTH DAILY AS NEEDED Patient taking  differently: Take 0.6 mg by mouth every evening.  11/25/17   [name], MD  CREON 24000-76000 units CPEP TAKE 1 CAPSULE BY MOUTH THREE TIMES DAILY (WITH MEALS) Patient taking differently: Take 1 capsule by mouth 3 (three) times daily with meals.  02/11/17   [name], MD  cyproheptadine (PERIACTIN) 4 MG tablet TAKE 2 TABLETS BY MOUTH EVERY EVENING Patient taking differently: Take 8 mg by mouth at bedtime.  07/16/17   Kozlow, Donnamarie Poag, MD  denosumab (PROLIA) 60 MG/ML SOLN injection Inject 60 mg into the skin every 6 (six) months. Administer in upper arm, thigh,  or abdomen    [provider]  dexlansoprazole (DEXILANT) 60 MG capsule Take 1 capsule (60 mg total) by mouth daily. 07/16/17   Kozlow, Donnamarie Poag, MD  diclofenac sodium (VOLTAREN) 1 % GEL Apply 2 g topically 4 (four) times daily. 12/05/17   [name], MD  EPINEPHrine (EPIPEN 2-PAK) 0.3 mg/0.3 mL IJ SOAJ injection Inject 0.3 mLs (0.3 mg total) into the muscle Once PRN for up to 1 dose. Patient taking differently: Inject 0.3 mg into the muscle once as needed (severe allergic reaction).  12/02/17   Juanito Doom, MD  feeding supplement, GLUCERNA SHAKE, (GLUCERNA SHAKE) LIQD Take 237 mLs by mouth 3 (three) times daily between meals. 12/23/17   Thurnell Lose, MD  gabapentin (NEURONTIN) 300 MG capsule Take 300 mg by mouth 2 (two) times daily.    [provider]  hydrocortisone (ANUSOL-HC) 2.5 % rectal cream APPLY AS NEEDED AS DIRECTED Patient taking differently: Place 1 application rectally as needed for hemorrhoids.  06/28/17   [name], MD  Lactase (LACTAID FAST ACT) 9000 units TABS Take 18,000 Units by mouth 4 (four) times daily as needed (dairy consumption).     [provider]  lidocaine (LIDODERM) 5 % Place 1 patch onto the skin daily. Remove & Discard patch within 12 hours or as directed by MD Patient taking differently: Place 1 patch onto the skin daily as needed (pain). Remove &  Discard patch within 12 hours or as directed by MD 08/21/16   [name], MD  mirtazapine (REMERON SOL-TAB) 30 MG disintegrating tablet DISSOLVE 1 TABLET IN MOUTH EVERY NIGHT AT BEDTIME 01/10/18   Gatha Mayer, MD  mometasone (NASONEX) 50 MCG/ACT nasal spray Place 2 sprays into the nose 2 (two) times daily. Patient taking differently: Place 2 sprays into the nose daily.  05/22/17   Juanito Doom, MD  mometasone-formoterol (DULERA) 200-5 MCG/ACT AERO Inhale 2 puffs into the lungs 2 (two) times daily. Patient taking differently: Inhale 1 puff into the lungs 2 (two) times daily.  07/16/17   Kozlow, Donnamarie Poag, MD  montelukast (SINGULAIR) 10 MG tablet Take 1 tablet (10 mg total) by mouth daily. 07/16/17   Kozlow, Donnamarie Poag, MD  mupirocin ointment (BACTROBAN) 2 % APPLY IN THE NOSE 3 TIMES DAILY AS NEEDED Patient taking differently: Place 1 application into the nose at bedtime. To prevent nose bleeds 07/16/17   Kozlow, Donnamarie Poag, MD  nystatin (MYCOSTATIN) 100000 UNIT/ML suspension USE AS DIRECTED 5MLS IN MOUTH OR THROAT 4 TIMES DAILY Patient taking differently: Use as directed 5 mLs in the mouth or throat at bedtime. Swish and spit 10/23/17   [name], MD  nystatin-triamcinolone ointment [facility]) Apply 1 application topically 3 (three) times daily as needed (yeast infection).     [provider]  Prenatal Vit-Sel-Fe Fum-FA (VINATE M) 27-1 MG TABS Take 1 tablet by mouth daily. 07/26/17   [name], MD  PRESCRIPTION MEDICATION Place 2 puffs into both ears once a week. Place capsule in dispenser and instill 2 puffs in each ear once a week after washing hair. CS (X) AHCL    CHLORAMP/SULFU/AMPHO B/ H DROC 100-100-5 mg capsule - compounded at [facility]    [provider]  Probiotic Product (ALIGN) 4 MG CAPS Take 4 mg by mouth every evening.     [provider]  Propylene Glycol-Glycerin (SOOTHE OP) Place 1 drop into both eyes 2 (two) times daily as needed (dry eyes).  [provider]  pseudoephedrine-guaifenesin (MUCINEX D) 60-600 MG 12 hr tablet Take 1 tablet by mouth 2 (two) times daily.    [provider]  ranitidine (ZANTAC) 300 MG tablet Take 1 tablet (300 mg total) by mouth daily. Patient taking differently: Take 300 mg by mouth every evening.  07/16/17   Kozlow, Donnamarie Poag, MD  Respiratory Therapy Supplies (FLUTTER) DEVI Use as directed 10/22/14   Elsie Stain, MD  Rivaroxaban (XARELTO) 15 MG TABS tablet Take 1 tablet (15 mg total) by mouth 2 (two) times daily with a meal for 21 days. 12/15/17 01/05/18  Thurnell Lose, MD  rivaroxaban (XARELTO) 20 MG TABS tablet Take 1 tablet (20 mg total) by mouth daily with supper. 01/05/18   Thurnell Lose, MD  SIMPLY SALINE NA Place 1 spray into both nostrils at bedtime.    [provider]  Spacer/Aero-Holding Chambers (AEROCHAMBER MV) inhaler Use as instructed 01/23/16   Javier Glazier, MD  Tiotropium Bromide Monohydrate (SPIRIVA RESPIMAT) 2.5 MCG/ACT AERS Inhale 2 puffs into the lungs daily. 07/16/17   Kozlow, Donnamarie Poag, MD    Family History Family History  Problem Relation Age of Onset  . Allergies Mother   . Heart disease Mother   . Arthritis Mother   . Lung cancer Mother   . Diabetes Mother   . Allergies Father   . Heart disease Father   . Arthritis Father   . Stroke Father   . Colon cancer Unknown        Maternal half aunt/Maternal half uncle  . Colitis Daughter   . Diabetes Maternal Grandfather     Social History Social History   Tobacco Use  . Smoking status: Passive Smoke Exposure - Never Smoker  . Smokeless tobacco: Never Used  . Tobacco comment: Parents  Substance Use Topics  . Alcohol use: Yes    Alcohol/week: 0.0 standard drinks    Comment: 02/20/2012 "couple glasses of wine/6 months"  . Drug use: No     Allergies   Dust mite extract; Molds & smuts; Morphine sulfate; Other; Penicillins; Reclast [zoledronic acid]; Rofecoxib; Shrimp flavor;  Tetracycline hcl; Xolair [omalizumab]; Dilaudid [hydromorphone hcl]; Hydrocodone-acetaminophen; Levofloxacin; Oxycodone hcl; Paroxetine; Celecoxib; Diltiazem; Lactose intolerance (gi); and Tree extract   Review of Systems Review of Systems  All other systems reviewed and are negative.    Physical Exam Updated Vital Signs BP 133/61 (BP Location: Left Arm)   Pulse 94   Temp (!) 101.7 F (38.7 C) (Rectal)   Resp 18   Ht 1.549 m (5\' 1" )   Wt 60.4 kg   SpO2 97%   BMI 25.16 kg/m   Physical Exam  Constitutional: She is oriented to person, place, and time. She appears well-developed and well-nourished.  Non-toxic appearance. No distress.  HENT:  Head: Normocephalic and atraumatic.  Eyes: Pupils are equal, round, and reactive to light. Conjunctivae, EOM and lids are normal.  Neck: Normal range of motion. Neck supple. No tracheal deviation present. No thyroid mass present.  Cardiovascular: Normal rate, regular rhythm and normal heart sounds. Exam reveals no gallop.  No murmur heard. Pulmonary/Chest: Effort normal and breath sounds normal. No stridor. No respiratory distress. She has no decreased breath sounds. She has no wheezes. She has no rhonchi. She has no rales.  Abdominal: Soft. Normal appearance and bowel sounds are normal. She exhibits no distension. There is no tenderness. There is no rebound and no CVA tenderness.  Musculoskeletal: Normal range of motion. She exhibits no  edema or tenderness.       Feet:  Neurological: She is alert and oriented to person, place, and time. She has normal strength. No cranial nerve deficit or sensory deficit. GCS eye subscore is 4. GCS verbal subscore is 5. GCS motor subscore is 6.  Skin: Skin is warm and dry. No abrasion and no rash noted.  Psychiatric: She has a normal mood and affect. Her speech is normal and behavior is normal.  Nursing note and vitals reviewed.    ED Treatments / Results  Labs (all labs ordered are listed, but only  abnormal results are displayed) Labs Reviewed - No data to display  EKG None  Radiology No results found.  Procedures Procedures (including critical care time)  Medications Ordered in ED Medications  0.9 %  sodium chloride infusion (has no administration in time range)     Initial Impression / Assessment and Plan / ED Course  I have reviewed the triage vital signs and the nursing notes.  Pertinent labs & imaging results that were available during my care of the patient were reviewed by me and considered in my medical decision making (see chart for details).     She is C. difficile is positive.  Was IV hydrated here.  Started on oral vancomycin and will be admitted to the hospitalist service  Final Clinical Impressions(s) / ED Diagnoses   Final diagnoses:  None    ED Discharge Orders    None       Lacretia Leigh, MD 01/14/18 2031

## 2018-01-14 NOTE — Telephone Encounter (Signed)
[facility] called and states that pt has been having diarrhea for the past several days and has gone x 3 today. She is on Xeralto and was unsure what she could take She did stop taking colchicine as she thought that was causing it.

## 2018-01-14 NOTE — ED Notes (Signed)
Bed: WA08 Expected date:  Expected time:  Means of arrival:  Comments: EMS/weak 

## 2018-01-14 NOTE — Telephone Encounter (Signed)
noted 

## 2018-01-14 NOTE — ED Notes (Signed)
Patient transported to X-ray 

## 2018-01-14 NOTE — Telephone Encounter (Signed)
Home health [facility] LPN called back and now pt is running a fever of 100.1 and her left foot is really swollen and red with a pain scale of 6 out of 10. Pt has not eaten today and is very weak. Recommend that pt go to ER to be examined.

## 2018-01-14 NOTE — ED Notes (Signed)
ED TO INPATIENT HANDOFF REPORT  Name/Age/Gender Ebony Scott 67 y.o. female  Code Status    Code Status Orders  (From admission, onward)         Start     Ordered   01/14/18 2026  Full code  Continuous     01/14/18 2027        Code Status History    Date Active Date Inactive Code Status Order ID Comments User Context   12/13/2017 1706 12/25/2017 0103 Full Code [id number]  Reubin Milan, MD ED   04/15/2016 2127 04/17/2016 1943 Full Code [id number]  Vianne Bulls, MD ED   02/20/2012 1729 02/26/2012 1540 Full Code [id number]  Payton Emerald, RN Inpatient      Home/SNF/Other Home  Chief Complaint generalized weakness  Level of Care/Admitting Diagnosis ED Disposition    ED Disposition Condition [facility]: [facility] [[id number]]  Level of Care: Med-Surg [16]  Diagnosis: Clostridium difficile colitis [196222]  Admitting Physician: Reubin Milan [[id number]]  Attending Physician: Reubin Milan [[id number]]  PT Class (Do Not Modify): Observation [104]  PT Acc Code (Do Not Modify): Observation [10022]       Medical History Past Medical History:  Diagnosis Date  . Allergic rhinitis   . Allergy    SEASONAL  . Anemia   . Anxiety    pt denies  . Arthritis    "mostly the hands" (02/20/2012)  . Asthma   . Breast cancer ([place]) 1998   in remission  . Cataract    REMOVED  . Chronic lower back pain   . Chronic neck pain   . Complication of anesthesia    "had hard time waking up from it several times" (02/20/2012)  . Depression    "some; don't take anything for it" (02/20/2012)  . Diverticulosis   . DVT (deep venous thrombosis) ([place])   . Exertional dyspnea   . Fibromyalgia 11/2011  . GERD (gastroesophageal reflux disease)   . Graves disease   . Headache(784.0)    "related to allergies; more at different times during the year" (02/20/2012)  . Hemorrhoids   . Hiatal hernia    back and neck  . Hx of adenomatous  colonic polyps 04/12/2016  . Hypercholesteremia    good cholesterol is high  . Hypothyroidism   . IBS (irritable bowel syndrome)   . Moderate persistent asthma    -FeV1 72% 2011, -IgE 102 2011, CT sinus Neg 2011  . Osteoporosis    on reclast yearly  . Pneumonia 04/2011; ~ 11/2011   "double; single" (02/20/2012)    Allergies Allergies  Allergen Reactions  . Dust Mite Extract Shortness Of Breath and Other (See Comments)    "sneezing" (02/20/2012)  . Molds & Smuts Shortness Of Breath  . Morphine Sulfate Itching  . Other Shortness Of Breath and Other (See Comments)    Grass and weeds "sneezing; filled sinuses" (02/20/2012)  . Penicillins Rash and Other (See Comments)    "welts" (02/20/2012) Has patient had a PCN reaction causing immediate rash, facial/tongue/throat swelling, SOB or lightheadedness with hypotension: Unknown Has patient had a PCN reaction causing severe rash involving mucus membranes or skin necrosis: No Has patient had a PCN reaction that required hospitalization: No Has patient had a PCN reaction occurring within the last 10 years: No If all of the above answers are "NO", then may proceed with Cephalosporin use.   Marland Kitchen Reclast [Zoledronic Acid] Other (See Comments)  Fever, Put in hospital, dr said it was a reaction from a reaction   . Rofecoxib Swelling    Vioxx REACTION: feet swelling  . Shrimp Flavor Anaphylaxis    ALL SHELLFISH  . Tetracycline Hcl Nausea And Vomiting  . Xolair [Omalizumab] Other (See Comments)    Caused Blood clot  . Dilaudid [Hydromorphone Hcl] Itching  . Hydrocodone-Acetaminophen Nausea And Vomiting  . Levofloxacin Other (See Comments)    REACTION: GI upset  . Oxycodone Hcl Nausea And Vomiting  . Paroxetine Nausea And Vomiting    Paxil   . Celecoxib Swelling    Feet swelling  . Diltiazem Swelling  . Lactose Intolerance (Gi) Other (See Comments)    Bloating and gas  . Tree Extract Other (See Comments)    "tested and told I was allergic to  it; never experienced a reaction to it" (02/20/2012)    IV Location/Drains/Wounds Patient Lines/Drains/Airways Status   Active Line/Drains/Airways    Name:   Placement date:   Placement time:   Site:   Days:   Peripheral IV 12/23/17 Right;Anterior Forearm   12/23/17    1344    Forearm   22   Peripheral IV 01/14/18 Left Antecubital   01/14/18    1628    Antecubital   less than 1   Peripheral IV 01/14/18 Left Arm   01/14/18    1709    Arm   less than 1   External Urinary Catheter   12/22/17    1045    -   23          Labs/Imaging Results for orders placed or performed during the hospital encounter of 01/14/18 (from the past 48 hour(s))  Comprehensive metabolic panel     Status: Abnormal   Collection Time: 01/14/18  5:07 PM  Result Value Ref Range   Sodium 138 135 - 145 mmol/L   Potassium 3.5 3.5 - 5.1 mmol/L   Chloride 103 98 - 111 mmol/L   CO2 23 22 - 32 mmol/L   Glucose, Bld 108 (H) 70 - 99 mg/dL   BUN 12 8 - 23 mg/dL   Creatinine, Ser 1.01 (H) 0.44 - 1.00 mg/dL   Calcium 8.2 (L) 8.9 - 10.3 mg/dL   Total Protein 7.3 6.5 - 8.1 g/dL   Albumin 3.3 (L) 3.5 - 5.0 g/dL   AST 69 (H) 15 - 41 U/L   ALT 61 (H) 0 - 44 U/L   Alkaline Phosphatase 70 38 - 126 U/L   Total Bilirubin 1.8 (H) 0.3 - 1.2 mg/dL   GFR calc non Af Amer 57 (L) >60 mL/min   GFR calc Af Amer >60 >60 mL/min   Anion gap 12 5 - 15    Comment: Performed at [facility], [place] [address] [place], [place] [address]  CBC WITH DIFFERENTIAL     Status: Abnormal   Collection Time: 01/14/18  5:07 PM  Result Value Ref Range   WBC 9.5 4.0 - 10.5 K/uL   RBC 3.18 (L) 3.87 - 5.11 MIL/uL   Hemoglobin 10.3 (L) 12.0 - 15.0 g/dL   HCT 33.3 (L) 36.0 - 46.0 %   MCV 104.7 (H) 80.0 - 100.0 fL   MCH 32.4 26.0 - 34.0 pg   MCHC 30.9 30.0 - 36.0 g/dL   RDW 14.9 11.5 - 15.5 %   Platelets 284 150 - 400 K/uL   nRBC 0.0 0.0 - 0.2 %   Neutrophils Relative % 65 %  Neutro Abs 6.1 1.7 - 7.7 K/uL   Lymphocytes Relative 16 %    Lymphs Abs 1.6 0.7 - 4.0 K/uL   Monocytes Relative 18 %   Monocytes Absolute 1.7 (H) 0.1 - 1.0 K/uL   Eosinophils Relative 0 %   Eosinophils Absolute 0.0 0.0 - 0.5 K/uL   Basophils Relative 0 %   Basophils Absolute 0.0 0.0 - 0.1 K/uL   Immature Granulocytes 1 %   Abs Immature Granulocytes 0.05 0.00 - 0.07 K/uL    Comment: Performed at [facility], [place] [address] [place], [place] [address]  I-Stat CG4 Lactic Acid, ED     Status: None   Collection Time: 01/14/18  5:14 PM  Result Value Ref Range   Lactic Acid, Venous 0.88 0.5 - 1.9 mmol/L  Urinalysis, Routine w reflex microscopic     Status: Abnormal   Collection Time: 01/14/18  6:45 PM  Result Value Ref Range   Color, Urine YELLOW YELLOW   APPearance HAZY (A) CLEAR   Specific Gravity, Urine 1.014 1.005 - 1.030   pH 6.0 5.0 - 8.0   Glucose, UA NEGATIVE NEGATIVE mg/dL   Hgb urine dipstick NEGATIVE NEGATIVE   Bilirubin Urine NEGATIVE NEGATIVE   Ketones, ur NEGATIVE NEGATIVE mg/dL   Protein, ur 30 (A) NEGATIVE mg/dL   Nitrite NEGATIVE NEGATIVE   Leukocytes, UA TRACE (A) NEGATIVE   RBC / HPF 0-5 0 - 5 RBC/hpf   WBC, UA 6-10 0 - 5 WBC/hpf   Bacteria, UA RARE (A) NONE SEEN   Squamous Epithelial / LPF 6-10 0 - 5   Mucus PRESENT     Comment: Performed at [facility], [place] [address] [place], [place] [address]  C difficile quick scan w PCR reflex     Status: Abnormal   Collection Time: 01/14/18  7:03 PM  Result Value Ref Range   C Diff antigen POSITIVE (A) NEGATIVE   C Diff toxin NEGATIVE NEGATIVE   C Diff interpretation Results are indeterminate. See PCR results.     Comment: Performed at [facility], [place] [address] [place], [place] [address]  I-Stat CG4 Lactic Acid, ED     Status: Abnormal   Collection Time: 01/14/18  7:09 PM  Result Value Ref Range   Lactic Acid, Venous 0.48 (L) 0.5 - 1.9 mmol/L   *Note: Due to a large number of results and/or encounters for the  requested time period, some results have not been displayed. A complete set of results can be found in Results Review.   Dg Chest 2 View  Result Date: 01/14/2018 CLINICAL DATA:  Weakness over the last 4 days, some shortness of breath, history of breast carcinoma diagnosed in 1998 EXAM: CHEST - 2 VIEW COMPARISON:  Chest x-ray 12/17/2017 FINDINGS: No active infiltrate is seen and there is no evidence of pleural effusion. Mediastinal and hilar contours are unremarkable. Mild cardiomegaly is stable. No acute bony abnormality is seen. IMPRESSION: Stable mild cardiomegaly.  No active lung disease. Electronically Signed   By: Ivar Drape M.D.   On: 01/14/2018 17:09   Dg Ankle Complete Left  Result Date: 01/14/2018 CLINICAL DATA:  Weakness over 4 days, left foot and ankle pain EXAM: LEFT ANKLE COMPLETE - 3+ VIEW COMPARISON:  Left ankle films of 12/16/2016 FINDINGS: The left ankle joint appears normal. Alignment is normal. No acute abnormality is seen. Again the nonunited fracture through the base of the left fifth metatarsal is present. IMPRESSION: Negative left ankle.  Ninety night and fracture through the base of the left fifth metatarsal. Electronically Signed   By: Ivar Drape M.D.   On: 01/14/2018 17:12   Dg Foot Complete Left  Result Date: 01/14/2018 CLINICAL DATA:  Weakness over the last 4 days, left foot and ankle pain, history of breast carcinoma diagnosed in 1998 EXAM: LEFT FOOT - COMPLETE 3+ VIEW COMPARISON:  Left foot films of 11/06/2016 FINDINGS: There is incomplete healing of the previously noted transverse fracture through the base of the left fifth metatarsal. The fracture line is still somewhat visible. No acute fracture is seen. Alignment is normal. No erosion is seen. IMPRESSION: Incomplete healing of the previously diagnosed transverse fracture through the base of the left fifth metatarsal. Electronically Signed   By: Ivar Drape M.D.   On: 01/14/2018 17:10   None  Pending  Labs Unresulted Labs (From admission, onward)    Start     Ordered   01/14/18 1903  C. Diff by PCR, Reflexed  Once,   STAT     01/14/18 1903   01/14/18 1643  Gastrointestinal Panel by PCR , Stool  (Gastrointestinal Panel by PCR, Stool)  Once,   R     01/14/18 1643   01/14/18 1642  Urine culture  ONCE - STAT,   STAT     01/14/18 1643   01/14/18 1641  Blood Culture (routine x 2)  BLOOD CULTURE X 2,   STAT     01/14/18 1643          Vitals/Pain Today's Vitals   01/14/18 1857 01/14/18 1858 01/14/18 1900 01/14/18 1930  BP:   (!) 117/58 (!) 119/59  Pulse:   90 85  Resp:    16  Temp:  100.1 F (37.8 C)    TempSrc:  Oral    SpO2:   97% 97%  Weight:      Height:      PainSc: 0-No pain       Isolation Precautions Enteric precautions (UV disinfection)  Medications Medications  0.9 %  sodium chloride infusion (1,000 mLs Intravenous New Bag/Given 01/14/18 1723)  vancomycin (VANCOCIN) 50 mg/mL oral solution 125 mg (has no administration in time range)  0.9 % NaCl with KCl 20 mEq/ L  infusion (has no administration in time range)  ondansetron (ZOFRAN) tablet 4 mg (has no administration in time range)    Or  ondansetron (ZOFRAN) injection 4 mg (has no administration in time range)  acetaminophen (TYLENOL) tablet 650 mg (has no administration in time range)    Or  acetaminophen (TYLENOL) suppository 650 mg (has no administration in time range)  acetaminophen (TYLENOL) tablet 650 mg (650 mg Oral Given 01/14/18 1719)    Mobility Walks

## 2018-01-14 NOTE — ED Triage Notes (Signed)
Pt to ED with c/o of generalized weakness for the last 4 days. Pt was recently discharged from [facility] for DVTS and once home started to feel weaker than normal. Pt states she has been having N/V/D for the past 2 days. Pt is taking Xeraltto. Pt has had a fever today of 101.1. Pt was tested for C. Diff at nursing home before discharged and was negative. Pt was not given tylenol bc refused due to UC DR telling her she could not since on Xeraltto. Pt received 500 NS bolus en route

## 2018-01-15 ENCOUNTER — Encounter (HOSPITAL_COMMUNITY): Payer: Self-pay

## 2018-01-15 ENCOUNTER — Observation Stay (HOSPITAL_COMMUNITY): Payer: Medicare Other

## 2018-01-15 DIAGNOSIS — N183 Chronic kidney disease, stage 3 (moderate): Secondary | ICD-10-CM

## 2018-01-15 DIAGNOSIS — M25579 Pain in unspecified ankle and joints of unspecified foot: Secondary | ICD-10-CM | POA: Diagnosis present

## 2018-01-15 DIAGNOSIS — Z8249 Family history of ischemic heart disease and other diseases of the circulatory system: Secondary | ICD-10-CM | POA: Diagnosis not present

## 2018-01-15 DIAGNOSIS — Z8261 Family history of arthritis: Secondary | ICD-10-CM | POA: Diagnosis not present

## 2018-01-15 DIAGNOSIS — S92351A Displaced fracture of fifth metatarsal bone, right foot, initial encounter for closed fracture: Secondary | ICD-10-CM

## 2018-01-15 DIAGNOSIS — E43 Unspecified severe protein-calorie malnutrition: Secondary | ICD-10-CM | POA: Diagnosis present

## 2018-01-15 DIAGNOSIS — K5790 Diverticulosis of intestine, part unspecified, without perforation or abscess without bleeding: Secondary | ICD-10-CM | POA: Diagnosis present

## 2018-01-15 DIAGNOSIS — M1120 Other chondrocalcinosis, unspecified site: Secondary | ICD-10-CM | POA: Diagnosis not present

## 2018-01-15 DIAGNOSIS — M810 Age-related osteoporosis without current pathological fracture: Secondary | ICD-10-CM | POA: Diagnosis present

## 2018-01-15 DIAGNOSIS — G8929 Other chronic pain: Secondary | ICD-10-CM | POA: Diagnosis present

## 2018-01-15 DIAGNOSIS — Z7901 Long term (current) use of anticoagulants: Secondary | ICD-10-CM | POA: Diagnosis not present

## 2018-01-15 DIAGNOSIS — K219 Gastro-esophageal reflux disease without esophagitis: Secondary | ICD-10-CM | POA: Diagnosis present

## 2018-01-15 DIAGNOSIS — R17 Unspecified jaundice: Secondary | ICD-10-CM | POA: Diagnosis present

## 2018-01-15 DIAGNOSIS — Z981 Arthrodesis status: Secondary | ICD-10-CM | POA: Diagnosis not present

## 2018-01-15 DIAGNOSIS — M25552 Pain in left hip: Secondary | ICD-10-CM | POA: Diagnosis not present

## 2018-01-15 DIAGNOSIS — Z90710 Acquired absence of both cervix and uterus: Secondary | ICD-10-CM | POA: Diagnosis not present

## 2018-01-15 DIAGNOSIS — I5032 Chronic diastolic (congestive) heart failure: Secondary | ICD-10-CM

## 2018-01-15 DIAGNOSIS — Z96651 Presence of right artificial knee joint: Secondary | ICD-10-CM | POA: Diagnosis present

## 2018-01-15 DIAGNOSIS — Z853 Personal history of malignant neoplasm of breast: Secondary | ICD-10-CM | POA: Diagnosis not present

## 2018-01-15 DIAGNOSIS — S92352D Displaced fracture of fifth metatarsal bone, left foot, subsequent encounter for fracture with routine healing: Secondary | ICD-10-CM | POA: Diagnosis not present

## 2018-01-15 DIAGNOSIS — Z7951 Long term (current) use of inhaled steroids: Secondary | ICD-10-CM | POA: Diagnosis not present

## 2018-01-15 DIAGNOSIS — I2699 Other pulmonary embolism without acute cor pulmonale: Secondary | ICD-10-CM | POA: Diagnosis not present

## 2018-01-15 DIAGNOSIS — M797 Fibromyalgia: Secondary | ICD-10-CM | POA: Diagnosis present

## 2018-01-15 DIAGNOSIS — A0472 Enterocolitis due to Clostridium difficile, not specified as recurrent: Secondary | ICD-10-CM | POA: Diagnosis present

## 2018-01-15 DIAGNOSIS — W19XXXA Unspecified fall, initial encounter: Secondary | ICD-10-CM | POA: Diagnosis present

## 2018-01-15 DIAGNOSIS — E785 Hyperlipidemia, unspecified: Secondary | ICD-10-CM | POA: Diagnosis present

## 2018-01-15 DIAGNOSIS — Z9049 Acquired absence of other specified parts of digestive tract: Secondary | ICD-10-CM | POA: Diagnosis not present

## 2018-01-15 DIAGNOSIS — M19042 Primary osteoarthritis, left hand: Secondary | ICD-10-CM | POA: Diagnosis present

## 2018-01-15 DIAGNOSIS — Z86010 Personal history of colonic polyps: Secondary | ICD-10-CM | POA: Diagnosis not present

## 2018-01-15 DIAGNOSIS — M545 Low back pain: Secondary | ICD-10-CM | POA: Diagnosis present

## 2018-01-15 DIAGNOSIS — M19041 Primary osteoarthritis, right hand: Secondary | ICD-10-CM | POA: Diagnosis present

## 2018-01-15 HISTORY — DX: Displaced fracture of fifth metatarsal bone, right foot, initial encounter for closed fracture: S92.351A

## 2018-01-15 LAB — GASTROINTESTINAL PANEL BY PCR, STOOL (REPLACES STOOL CULTURE)

## 2018-01-15 LAB — COMPREHENSIVE METABOLIC PANEL
ALT: 51 U/L — ABNORMAL HIGH (ref 0–44)
AST: 52 U/L — ABNORMAL HIGH (ref 15–41)
Albumin: 2.6 g/dL — ABNORMAL LOW (ref 3.5–5.0)
Alkaline Phosphatase: 62 U/L (ref 38–126)
Anion gap: 7 (ref 5–15)
BUN: 10 mg/dL (ref 8–23)
CO2: 21 mmol/L — ABNORMAL LOW (ref 22–32)
Calcium: 7.4 mg/dL — ABNORMAL LOW (ref 8.9–10.3)
Chloride: 111 mmol/L (ref 98–111)
Creatinine, Ser: 0.87 mg/dL (ref 0.44–1.00)
GFR calc Af Amer: >60 mL/min (ref >60)
GFR calc non Af Amer: >60 mL/min (ref >60)
Glucose, Bld: 89 mg/dL (ref 70–99)
Potassium: 4.1 mmol/L (ref 3.5–5.1)
Sodium: 139 mmol/L (ref 135–145)
Total Bilirubin: 1.6 mg/dL — ABNORMAL HIGH (ref 0.3–1.2)
Total Protein: 5.8 g/dL — ABNORMAL LOW (ref 6.5–8.1)

## 2018-01-15 LAB — CBC WITH DIFFERENTIAL/PLATELET
Abs Immature Granulocytes: 0.04 10*3/uL (ref 0.00–0.07)
Basophils Absolute: 0 10*3/uL (ref 0.0–0.1)
Basophils Relative: 0 %
Eosinophils Absolute: 0 10*3/uL (ref 0.0–0.5)
Eosinophils Relative: 0 %
HCT: 28.6 % — ABNORMAL LOW (ref 36.0–46.0)
Hemoglobin: 8.8 g/dL — ABNORMAL LOW (ref 12.0–15.0)
Immature Granulocytes: 1 %
Lymphocytes Relative: 17 %
Lymphs Abs: 1.3 10*3/uL (ref 0.7–4.0)
MCH: 31.9 pg (ref 26.0–34.0)
MCHC: 30.8 g/dL (ref 30.0–36.0)
MCV: 103.6 fL — ABNORMAL HIGH (ref 80.0–100.0)
Monocytes Absolute: 1.3 10*3/uL — ABNORMAL HIGH (ref 0.1–1.0)
Monocytes Relative: 17 %
Neutro Abs: 5.2 10*3/uL (ref 1.7–7.7)
Neutrophils Relative %: 65 %
Platelets: 247 10*3/uL (ref 150–400)
RBC: 2.76 MIL/uL — ABNORMAL LOW (ref 3.87–5.11)
RDW: 14.7 % (ref 11.5–15.5)
WBC: 8 10*3/uL (ref 4.0–10.5)
nRBC: 0 % (ref 0.0–0.2)

## 2018-01-15 LAB — CLOSTRIDIUM DIFFICILE BY PCR, REFLEXED: Toxigenic C. Difficile by PCR: POSITIVE — AB

## 2018-01-15 MED ORDER — SALINE SPRAY 0.65 % NA SOLN: 1.0000 | NASAL | Status: DC | PRN

## 2018-01-15 MED ORDER — RIVAROXABAN 20 MG PO TABS
20.0000 mg | ORAL_TABLET | Freq: Every day | ORAL | Status: DC
  Administered 2018-01-15 – 2018-01-23 (×9): 20 mg via ORAL
  Filled 2018-01-15 (×10): qty 1

## 2018-01-15 MED ORDER — RIVAROXABAN 20 MG PO TABS: 20.0000 mg | ORAL_TABLET | Freq: Every day | ORAL | Status: DC

## 2018-01-15 MED ORDER — GABAPENTIN 300 MG PO CAPS
600.0000 mg | ORAL_CAPSULE | Freq: Every day | ORAL | Status: DC
  Administered 2018-01-15: 600 mg via ORAL
  Filled 2018-01-15 (×2): qty 2

## 2018-01-15 MED ORDER — SIMETHICONE 80 MG PO CHEW
160.0000 mg | CHEWABLE_TABLET | Freq: Four times a day (QID) | ORAL | Status: DC | PRN
  Administered 2018-01-19 – 2018-01-21 (×4): 160 mg via ORAL
  Filled 2018-01-15 (×4): qty 2

## 2018-01-15 MED ORDER — SODIUM CHLORIDE (PF) 0.9 % IJ SOLN
INTRAMUSCULAR | Status: AC
  Filled 2018-01-15: qty 50

## 2018-01-15 MED ORDER — MOMETASONE FURO-FORMOTEROL FUM 200-5 MCG/ACT IN AERO
1.0000 | INHALATION_SPRAY | Freq: Two times a day (BID) | RESPIRATORY_TRACT | Status: DC
  Administered 2018-01-16 – 2018-01-23 (×8): 1 via RESPIRATORY_TRACT
  Filled 2018-01-15: qty 8.8

## 2018-01-15 MED ORDER — MIRTAZAPINE 30 MG PO TBDP: 30.0000 mg | ORAL_TABLET | Freq: Two times a day (BID) | ORAL | Status: DC | PRN

## 2018-01-15 MED ORDER — PANCRELIPASE (LIP-PROT-AMYL) 24000-76000 UNITS PO CPEP: 1.0000 | ORAL_CAPSULE | Freq: Three times a day (TID) | ORAL | Status: DC

## 2018-01-15 MED ORDER — PANTOPRAZOLE SODIUM 40 MG PO TBEC: 40.0000 mg | DELAYED_RELEASE_TABLET | Freq: Every day | ORAL | Status: DC

## 2018-01-15 MED ORDER — TRAMADOL HCL 50 MG PO TABS: 50.0000 mg | ORAL_TABLET | Freq: Four times a day (QID) | ORAL | Status: DC | PRN

## 2018-01-15 MED ORDER — ONDANSETRON HCL 4 MG/2ML IJ SOLN: 4.0000 mg | Freq: Four times a day (QID) | INTRAMUSCULAR | Status: DC | PRN

## 2018-01-15 MED ORDER — UMECLIDINIUM BROMIDE 62.5 MCG/INH IN AEPB
1.0000 | INHALATION_SPRAY | Freq: Every day | RESPIRATORY_TRACT | Status: DC
  Administered 2018-01-16 – 2018-01-23 (×5): 1 via RESPIRATORY_TRACT
  Filled 2018-01-15: qty 7

## 2018-01-15 MED ORDER — ONDANSETRON HCL 4 MG PO TABS: 4.0000 mg | ORAL_TABLET | Freq: Four times a day (QID) | ORAL | Status: DC | PRN

## 2018-01-15 MED ORDER — GABAPENTIN 300 MG PO CAPS
300.0000 mg | ORAL_CAPSULE | Freq: Two times a day (BID) | ORAL | Status: DC
  Administered 2018-01-15 – 2018-01-23 (×17): 300 mg via ORAL
  Filled 2018-01-15 (×18): qty 1

## 2018-01-15 MED ORDER — PANTOPRAZOLE SODIUM 40 MG PO TBEC
40.0000 mg | DELAYED_RELEASE_TABLET | Freq: Every day | ORAL | Status: DC
  Administered 2018-01-15 – 2018-01-16 (×2): 40 mg via ORAL
  Filled 2018-01-15 (×2): qty 1

## 2018-01-15 MED ORDER — PRO-STAT SUGAR FREE PO LIQD
30.0000 mL | Freq: Three times a day (TID) | ORAL | Status: DC
  Administered 2018-01-15 – 2018-01-23 (×25): 30 mL via ORAL
  Filled 2018-01-15 (×21): qty 30

## 2018-01-15 MED ORDER — CALCIUM CARBONATE 1250 (500 CA) MG PO TABS
1.0000 | ORAL_TABLET | Freq: Two times a day (BID) | ORAL | Status: DC
  Administered 2018-01-15 – 2018-01-23 (×18): 500 mg via ORAL
  Filled 2018-01-15 (×18): qty 1

## 2018-01-15 MED ORDER — ALIGN 4 MG PO CAPS: 4.0000 mg | ORAL_CAPSULE | Freq: Every evening | ORAL | Status: DC

## 2018-01-15 MED ORDER — VITAMIN D 25 MCG (1000 UNIT) PO TABS
1000.0000 [IU] | ORAL_TABLET | Freq: Every day | ORAL | Status: DC
  Administered 2018-01-15 – 2018-01-23 (×9): 1000 [IU] via ORAL
  Filled 2018-01-15 (×10): qty 1

## 2018-01-15 MED ORDER — LACTASE 3000 UNITS PO TABS
6000.0000 [IU] | ORAL_TABLET | Freq: Three times a day (TID) | ORAL | Status: DC
  Administered 2018-01-15 – 2018-01-23 (×22): 6000 [IU] via ORAL
  Filled 2018-01-15 (×27): qty 2

## 2018-01-15 MED ORDER — IOHEXOL 300 MG/ML  SOLN
100.0000 mL | Freq: Once | INTRAMUSCULAR | Status: AC | PRN
  Administered 2018-01-15: 100 mL via INTRAVENOUS

## 2018-01-15 MED ORDER — RIVAROXABAN 15 MG PO TABS: 15.0000 mg | ORAL_TABLET | Freq: Two times a day (BID) | ORAL | Status: DC

## 2018-01-15 MED ORDER — HYDROCORTISONE 2.5 % RE CREA: TOPICAL_CREAM | Freq: Three times a day (TID) | RECTAL | Status: AC | PRN

## 2018-01-15 MED ORDER — IOHEXOL 300 MG/ML  SOLN
30.0000 mL | Freq: Once | INTRAMUSCULAR | Status: AC | PRN
  Administered 2018-01-15: 30 mL via ORAL

## 2018-01-15 MED ORDER — MIRTAZAPINE 30 MG PO TBDP
30.0000 mg | ORAL_TABLET | Freq: Every day | ORAL | Status: DC
  Administered 2018-01-15 – 2018-01-22 (×8): 30 mg via ORAL
  Filled 2018-01-15 (×9): qty 1

## 2018-01-15 MED ORDER — PANCRELIPASE (LIP-PROT-AMYL) 12000-38000 UNITS PO CPEP
24000.0000 [IU] | ORAL_CAPSULE | Freq: Three times a day (TID) | ORAL | Status: DC
  Administered 2018-01-15 – 2018-01-23 (×23): 24000 [IU] via ORAL
  Filled 2018-01-15 (×22): qty 2

## 2018-01-15 MED ORDER — NYSTATIN 100000 UNIT/ML MT SUSP
5.0000 mL | Freq: Every day | OROMUCOSAL | Status: DC
  Administered 2018-01-20 – 2018-01-22 (×2): 500000 [IU] via OROMUCOSAL
  Filled 2018-01-15 (×7): qty 5

## 2018-01-15 MED ORDER — MONTELUKAST SODIUM 10 MG PO TABS: 10.0000 mg | ORAL_TABLET | Freq: Every day | ORAL | Status: DC

## 2018-01-15 MED ORDER — MONTELUKAST SODIUM 10 MG PO TABS
10.0000 mg | ORAL_TABLET | Freq: Every day | ORAL | Status: DC
  Administered 2018-01-15 – 2018-01-21 (×7): 10 mg via ORAL
  Filled 2018-01-15 (×7): qty 1

## 2018-01-15 MED ORDER — SORBITOL 70 % SOLN
30.0000 mL | Freq: Every day | Status: DC | PRN
  Filled 2018-01-15: qty 30

## 2018-01-15 MED ORDER — SACCHAROMYCES BOULARDII 250 MG PO CAPS
250.0000 mg | ORAL_CAPSULE | Freq: Two times a day (BID) | ORAL | Status: DC
  Administered 2018-01-15 – 2018-01-23 (×17): 250 mg via ORAL
  Filled 2018-01-15 (×17): qty 1

## 2018-01-15 MED ORDER — ACETAMINOPHEN 325 MG PO TABS: 650.0000 mg | ORAL_TABLET | Freq: Four times a day (QID) | ORAL | Status: DC | PRN

## 2018-01-15 MED ORDER — FERROUS SULFATE 325 (65 FE) MG PO TABS
325.0000 mg | ORAL_TABLET | Freq: Every day | ORAL | Status: DC
  Administered 2018-01-17 – 2018-01-23 (×7): 325 mg via ORAL
  Filled 2018-01-15 (×9): qty 1

## 2018-01-15 MED ORDER — FAMOTIDINE 20 MG PO TABS
20.0000 mg | ORAL_TABLET | Freq: Every day | ORAL | Status: DC
  Administered 2018-01-15: 20 mg via ORAL
  Filled 2018-01-15: qty 1

## 2018-01-15 MED ORDER — GLUCERNA SHAKE PO LIQD
237.0000 mL | Freq: Three times a day (TID) | ORAL | Status: DC
  Administered 2018-01-16 – 2018-01-23 (×8): 237 mL via ORAL
  Filled 2018-01-15 (×27): qty 237

## 2018-01-15 NOTE — Progress Notes (Signed)
RRT paged by bedside RN in regards to patient's elevated temperature and concern with patient's orientation. Upon entering room, patient was alert and oriented x 4. Temperature was originally 60 F orally before tyelanol given but now 102.1 F oral. Advised RN to place ice packs on patient to help assist with decreasing temperature. Patient was in no distress, BP was 136/83. Call RRT if any other concerns develop or temperature not able to be controlled with interventions.

## 2018-01-15 NOTE — Progress Notes (Signed)
[facility] PROGRESS NOTE  Ebony Scott:[id number] DOB: 1951-01-10 DOA: 01/14/2018  PCP: [name], MD  Brief History/Interval Summary: 67 y.o. female with medical history significant of allergic rhinitis, seasonal allergies, asthma, anemia, anxiety, depression, osteoarthritis, pseudogout, breast cancer in remission, cataracts, chronic lower back pain, chronic neck pain, bilateral DVTs, diverticulosis, fibromyalgia, GERD, Graves' disease, hyperthyroidism, headache, history of hemorrhoids, IBS, osteoporosis, history of pneumonia who was recently admitted to [facility] acute PE and DVT.  She was started on anticoagulation.  Discharged to skilled nursing facility.  She did mention that she has been having diarrhea ever since that last hospitalization but presented with worsening GI symptoms with nausea vomiting and diarrhea.  She was found to be febrile.  C. difficile antigen was positive.  She was hospitalized for further management.  Reason for Visit: Possible C. difficile colitis.  Bilateral metatarsal fractures  Consultants: None yet  Procedures: None  Antibiotics: Oral vancomycin  Subjective/Interval History: Patient continues to feel fatigued.  Continues to have loose stool.  Continues to have abdominal pain with some nausea.  No vomiting.  ROS: Denies any headaches  Objective:  Vital Signs  Vitals:   01/14/18 1900 01/14/18 1930 01/14/18 2223 01/15/18 0356  BP: (!) 117/58 (!) 119/59 125/61 136/62  Pulse: 90 85 80 88  Resp:  16 16 16   Temp:    98.8 F (37.1 C)  TempSrc:    Oral  SpO2: 97% 97% 95% 98%  Weight:      Height:        Intake/Output Summary (Last 24 hours) at 01/15/2018 1355 Last data filed at 01/15/2018 1007 Gross per 24 hour  Intake 1581.23 ml  Output 400 ml  Net 1181.23 ml   Filed Weights   01/14/18 1632  Weight: 60.4 kg    General appearance: alert, cooperative, appears stated age, fatigued and no distress Resp: clear to auscultation  bilaterally Cardio: regular rate and rhythm, S1, S2 normal, no murmur, click, rub or gallop GI: Abdomen is soft.  Diffusely tender.  More so in the left lower quadrant.  No guarding.  No rigidity.  Bowel sounds present.  No masses organomegaly. Extremities: extremities normal, atraumatic, no cyanosis or edema Neurologic: Alert and oriented x3.  No obvious focal neurological deficits.  Lab Results:  Data Reviewed: I have personally reviewed following labs and imaging studies  CBC: Recent Labs  Lab 01/14/18 1707 01/15/18 0902  WBC 9.5 8.0  NEUTROABS 6.1 5.2  HGB 10.3* 8.8*  HCT 33.3* 28.6*  MCV 104.7* 103.6*  PLT 284 025    Basic Metabolic Panel: Recent Labs  Lab 01/14/18 1707 01/15/18 0902  NA 138 139  K 3.5 4.1  CL 103 111  CO2 23 21*  GLUCOSE 108* 89  BUN 12 10  CREATININE 1.01* 0.87  CALCIUM 8.2* 7.4*    GFR: Estimated Creatinine Clearance: 50.9 mL/min (by C-G formula based on SCr of 0.87 mg/dL).  Liver Function Tests: Recent Labs  Lab 01/14/18 1707 01/15/18 0902  AST 69* 52*  ALT 61* 51*  ALKPHOS 70 62  BILITOT 1.8* 1.6*  PROT 7.3 5.8*  ALBUMIN 3.3* 2.6*     Recent Results (from the past 240 hour(s))  Blood Culture (routine x 2)     Status: None (Preliminary result)   Collection Time: 01/14/18  5:07 PM  Result Value Ref Range Status   Specimen Description   Final    LEFT ANTECUBITAL Blood Culture results may not be optimal due to an  excessive volume of blood received in culture bottles BOTTLES DRAWN AEROBIC ONLY Performed at [facility] [address], [place], [place] [address]    Special Requests   Final    NONE Performed at [facility], [place] [address] [place], [place] [address]    Culture   Final    NO GROWTH < 24 HOURS Performed at [facility] [address] [place], [place] [address]    Report Status PENDING  Incomplete  Blood Culture (routine x 2)     Status: None (Preliminary result)     Collection Time: 01/14/18  5:17 PM  Result Value Ref Range Status   Specimen Description   Final    RIGHT ANTECUBITAL Blood Culture results may not be optimal due to an excessive volume of blood received in culture bottles BOTTLES DRAWN AEROBIC AND ANAEROBIC Performed at [facility] [address], [place], [place] [address]    Special Requests   Final    NONE Performed at [facility], [place] [address], [place], [place] [address]    Culture   Final    NO GROWTH < 24 HOURS Performed at [facility] [address], [place], [place] [address]    Report Status PENDING  Incomplete  C difficile quick scan w PCR reflex     Status: Abnormal   Collection Time: 01/14/18  7:03 PM  Result Value Ref Range Status   C Diff antigen POSITIVE (A) NEGATIVE Final   C Diff toxin NEGATIVE NEGATIVE Final   C Diff interpretation Results are indeterminate. See PCR results.  Final    Comment: Performed at [facility], [place] [address], [place], [place] [address]  C. Diff by PCR, Reflexed     Status: Abnormal   Collection Time: 01/14/18  7:03 PM  Result Value Ref Range Status   Toxigenic C. Difficile by PCR POSITIVE (A) NEGATIVE Final    Comment: Positive for toxigenic C. difficile with little to no toxin production. Only treat if clinical presentation suggests symptomatic illness. Performed at [facility], [place] [address] [place], [place] [address]       Radiology Studies: Dg Chest 2 View  Result Date: 01/14/2018 CLINICAL DATA:  Weakness over the last 4 days, some shortness of breath, history of breast carcinoma diagnosed in 1998 EXAM: CHEST - 2 VIEW COMPARISON:  Chest x-ray 12/17/2017 FINDINGS: No active infiltrate is seen and there is no evidence of pleural effusion. Mediastinal and hilar contours are unremarkable. Mild cardiomegaly is stable. No acute bony abnormality is seen. IMPRESSION: Stable mild cardiomegaly.  No active lung  disease. Electronically Signed   By: Ivar Drape M.D.   On: 01/14/2018 17:09   Dg Ankle 2 Views Right  Result Date: 01/14/2018 CLINICAL DATA:  Right foot and ankle pain. EXAM: RIGHT ANKLE - 2 VIEW COMPARISON:  Ankle radiograph 06/10/2012 FINDINGS: There is no evidence of fracture or dislocation. Ankle mortise is preserved. Mild talonavicular degenerative change. Plantar calcaneal spur and Achilles tendon enthesophyte. No bony destructive change or periosteal reaction. Diffuse soft tissue edema about the ankle. Increased density anteriorly may be generalized soft tissue edema or small effusion. IMPRESSION: 1. Diffuse soft tissue edema about the ankle. Focal soft tissue density anteriorly may be joint effusion or more generalized subcutaneous edema. 2. No acute osseous abnormality. Electronically Signed   By: Keith Rake M.D.   On: 01/14/2018 22:39   Dg Ankle Complete  Left  Result Date: 01/14/2018 CLINICAL DATA:  Weakness over 4 days, left foot and ankle pain EXAM: LEFT ANKLE COMPLETE - 3+ VIEW COMPARISON:  Left ankle films of 12/16/2016 FINDINGS: The left ankle joint appears normal. Alignment is normal. No acute abnormality is seen. Again the nonunited fracture through the base of the left fifth metatarsal is present. IMPRESSION: Negative left ankle. Ninety night and fracture through the base of the left fifth metatarsal. Electronically Signed   By: Ivar Drape M.D.   On: 01/14/2018 17:12   Dg Foot 2 Views Right  Result Date: 01/14/2018 CLINICAL DATA:  Right foot and ankle pain. EXAM: RIGHT FOOT - 2 VIEW COMPARISON:  Foot radiograph 02/20/2012 FINDINGS: Suspected incomplete transverse fracture of the proximal fifth metatarsal about the lateral cortex. No other fracture. Small osseous density about delay lateral cuboid is likely an accessory ossicle. Minimal hallux valgus, alignment is otherwise maintained. No periosteal reaction or bony destructive change. Plantar calcaneal spur and Achilles  tendon enthesophyte. Diffuse soft tissue edema. IMPRESSION: Suspected incomplete transverse fracture of the proximal fifth metatarsal shaft. Electronically Signed   By: Keith Rake M.D.   On: 01/14/2018 22:42   Dg Foot Complete Left  Result Date: 01/14/2018 CLINICAL DATA:  Weakness over the last 4 days, left foot and ankle pain, history of breast carcinoma diagnosed in 1998 EXAM: LEFT FOOT - COMPLETE 3+ VIEW COMPARISON:  Left foot films of 11/06/2016 FINDINGS: There is incomplete healing of the previously noted transverse fracture through the base of the left fifth metatarsal. The fracture line is still somewhat visible. No acute fracture is seen. Alignment is normal. No erosion is seen. IMPRESSION: Incomplete healing of the previously diagnosed transverse fracture through the base of the left fifth metatarsal. Electronically Signed   By: Ivar Drape M.D.   On: 01/14/2018 17:10   Dg Hip Port Unilat With Pelvis 1v Left  Result Date: 01/15/2018 CLINICAL DATA:  Left hip pain EXAM: DG HIP (WITH OR WITHOUT PELVIS) 1V PORT LEFT COMPARISON:  None. FINDINGS: Early spurring in the hip joints bilaterally. SI joints are symmetric and unremarkable. No acute bony abnormality. Specifically, no fracture, subluxation, or dislocation. IMPRESSION: Early spurring in the hip joints bilaterally. No acute bony abnormality. Electronically Signed   By: Rolm Baptise M.D.   On: 01/15/2018 11:07     Medications:  Scheduled: . calcium carbonate  1 tablet Oral BID WC  . cholecalciferol  1,000 Units Oral Daily  . famotidine  20 mg Oral QHS  . feeding supplement (GLUCERNA SHAKE)  237 mL Oral TID BM  . feeding supplement (PRO-STAT SUGAR FREE 64)  30 mL Oral TID WC  . ferrous sulfate  325 mg Oral Q breakfast  . gabapentin  300 mg Oral BID  . gabapentin  600 mg Oral QHS  . lactase  6,000 Units Oral TID WC  . lipase/protease/amylase  24,000 Units Oral TID WC  . mirtazapine  30 mg Oral QHS  . mometasone-formoterol  1  puff Inhalation BID  . montelukast  10 mg Oral Daily  . nystatin  5 mL Mouth/Throat QHS  . pantoprazole  40 mg Oral Daily  . rivaroxaban  20 mg Oral Q supper  . saccharomyces boulardii  250 mg Oral BID  . umeclidinium bromide  1 puff Inhalation Daily  . vancomycin  125 mg Oral QID   Continuous: . 0.9 % NaCl with KCl 20 mEq / L 100 mL/hr at 01/15/18 0160   Scott:[id number] **OR** acetaminophen, hydrocortisone, iohexol,  ondansetron **OR** ondansetron (ZOFRAN) IV, simethicone, sodium chloride, sorbitol, traMADol    Assessment/Plan:  C. difficile colitis Patient presented with nausea vomiting and profuse diarrhea.  Stool studies were done.  C. difficile antigen was positive.  Toxin negative.  PCR positive.  GI pathogen panel pending.  Patient with abdominal tenderness on examination.  Her WBC is normal.  She was febrile last night.  Proceed with CT scan of the abdomen and pelvis.  Continue with oral vancomycin.  Close fracture of the fifth metatarsal bone of the right foot as well as the left Noted on imaging studies done yesterday.  The patient was supposed to follow-up with orthopedic surgeon, Dr. Sharol Given next week.  We will need to consult with him while she is here.  Patient also mentioned left-sided hip pain when the left lower extremity was being examined.  There was limitation in the range of motion.  Hip x-ray however does not show any fracture.  Will wait on the CT abdomen and pelvis to see if that shows any musculoskeletal issue.  History of chronic diastolic CHF Patient actually is somewhat volume depleted.  Continue to monitor closely.  She is on IV fluids.  We will decrease the rate of fluids.  Chronic kidney disease stage III Renal function improved compared to last month.  Continue to monitor urine output.    Macrocytic anemia Drop in hemoglobin likely dilutional.  No evidence of overt bleeding.  Anemia panel from a month ago reviewed.  No clear-cut deficiencies identified.   TSH was 5.0 in October.  History of acute PE and DVT diagnosed recently Continue Xarelto  History of depression  continue Remeron  History of pseudogout Colchicine as needed  Hyperthyroidism No longer on methimazole. TSH was 5.0 in October.  Outpatient monitoring.  Hyperbilirubinemia  She had right upper quadrant ultrasound last month which did not show any acute findings.  She is status post cholecystectomy.  Severe protein calorie malnutrition Nutritional supplements   DVT Prophylaxis: On Xarelto    Code Status: Full code Family Communication: Discussed with the patient Disposition Plan: Management as outlined above.    LOS: 1 day   [facility] Pager [phone] 01/15/2018, 1:55 PM  If 7PM-7AM, please contact night-coverage at [email or web address], password [facility]

## 2018-01-15 NOTE — Progress Notes (Signed)
PT Cancellation Note  Patient Details Name: Ebony Scott MRN: [id number] DOB: February 18, 1951   Cancelled Treatment:    Reason Eval/Treat Not Completed: Patient not medically ready;Other (comment)(Pt with closed fracture of fifth metatarsal bone of right foot, awaiting surgery consult and WB status orders. PT to check back after ortho consult.)   Julien Girt, PT Acute Rehabilitation Services Pager [phone]  Office [phone]    Roxine Caddy D Elonda Husky 01/15/2018, 12:48 PM

## 2018-01-16 ENCOUNTER — Encounter (HOSPITAL_COMMUNITY): Payer: Self-pay

## 2018-01-16 DIAGNOSIS — I2699 Other pulmonary embolism without acute cor pulmonale: Secondary | ICD-10-CM

## 2018-01-16 LAB — CBC
HCT: 29 % — ABNORMAL LOW (ref 36.0–46.0)
Hemoglobin: 8.9 g/dL — ABNORMAL LOW (ref 12.0–15.0)
MCH: 31.8 pg (ref 26.0–34.0)
MCHC: 30.7 g/dL (ref 30.0–36.0)
MCV: 103.6 fL — ABNORMAL HIGH (ref 80.0–100.0)
Platelets: 256 10*3/uL (ref 150–400)
RBC: 2.8 MIL/uL — ABNORMAL LOW (ref 3.87–5.11)
RDW: 14.8 % (ref 11.5–15.5)
WBC: 7.9 10*3/uL (ref 4.0–10.5)
nRBC: 0 % (ref 0.0–0.2)

## 2018-01-16 LAB — SEDIMENTATION RATE: Sed Rate: 138 mm/hr — ABNORMAL HIGH (ref 0–22)

## 2018-01-16 LAB — URINE CULTURE

## 2018-01-16 LAB — BASIC METABOLIC PANEL
Anion gap: 8 (ref 5–15)
BUN: 10 mg/dL (ref 8–23)
CO2: 19 mmol/L — ABNORMAL LOW (ref 22–32)
Calcium: 7.7 mg/dL — ABNORMAL LOW (ref 8.9–10.3)
Chloride: 111 mmol/L (ref 98–111)
Creatinine, Ser: 0.93 mg/dL (ref 0.44–1.00)
GFR calc Af Amer: >60 mL/min (ref >60)
GFR calc non Af Amer: >60 mL/min (ref >60)
Glucose, Bld: 90 mg/dL (ref 70–99)
Potassium: 4.2 mmol/L (ref 3.5–5.1)
Sodium: 138 mmol/L (ref 135–145)

## 2018-01-16 LAB — C-REACTIVE PROTEIN: CRP: 15.8 mg/dL — ABNORMAL HIGH (ref <1.0)

## 2018-01-16 NOTE — Evaluation (Signed)
Occupational Therapy Evaluation Patient Details Name: CAITLYNNE HARBECK MRN: [id number] DOB: 01/13/1951 Today's Date: 01/16/2018    History of Present Illness pt was admitted with nausea/vomiting/diarrhea.  She was discharged to SNF earlier this month after hospitalization at [facility] and was home with therapies and home instead (2x day).  PMH:  anxiety, depression, breast CA, chronic neck and low back pain, bil DVTs, fibromyalgia   Clinical Impression   Pt was agreeable to bed level evaluation:  Awaiting clarification of bil LE weight bearing status.  She has had a fever and doesn't feel well.  Educated on level one theraband program to do on her own as she feels up to it, and she return demonstrated the 2 exercises we are starting with (FF; biceps).  Will continue to follow in acute setting and upgrade goals as able.      Follow Up Recommendations  Limited evaluation:  SNF(vs assistance for adls/mobilty and HHOT) vs CIR, if she has the endurance    Equipment Recommendations  None recommended by OT    Recommendations for Other Services       Precautions / Restrictions Precautions Precautions: Fall Precaution Comments: AWAITING CLARIFICATION FOR BIL LE WEIGHT BEARING STATUS; BIL 5th METATARSAL FXS Restrictions Weight Bearing Restrictions: No      Mobility Bed Mobility     Rolling: Min assist         General bed mobility comments: light min A to bil sides.    Transfers                      Balance                                           ADL either performed or assessed with clinical judgement   ADL       Grooming: Moderate assistance   Upper Body Bathing: Maximal assistance   Lower Body Bathing: Total assistance   Upper Body Dressing : Maximal assistance   Lower Body Dressing: Total assistance                 General ADL Comments: Pt seen from a bed level.  Pt has been able to feed herself but doesn't really have the energy for  much in the way of ADLs.  Still had a fever.  Did not feel up to sitting EOB today.  Agreeable to working with level one theraband on her own as she feels able     Vision         Perception     Praxis      Pertinent Vitals/Pain Pain Assessment: Faces Faces Pain Scale: Hurts even more Pain Location: all over     Hand Dominance     Extremity/Trunk Assessment Upper Extremity Assessment Upper Extremity Assessment: Generalized weakness(reports bil hand to shoulder pain.  Able to use functionally)           Communication Communication Communication: No difficulties   Cognition Arousal/Alertness: Awake/alert Behavior During Therapy: WFL for tasks assessed/performed Overall Cognitive Status: Within Functional Limits for tasks assessed                                     General Comments       Exercises General Exercises - Upper Extremity Shoulder Flexion: Both;Strengthening;Supine;Theraband  Theraband Level (Shoulder Flexion): Level 1 (Yellow) Elbow Flexion: Strengthening;Both;Theraband Theraband Level (Elbow Flexion): Level 1 (Yellow)   Shoulder Instructions      Home Living Family/patient expects to be discharged to:: Unsure Living Arrangements: Alone                 Bathroom Shower/Tub: Walk-in shower   Bathroom Toilet: Handicapped height     Home Equipment: Environmental consultant - 2 wheels;Bedside commode;Shower seat - built in;Grab bars - tub/shower          Prior Functioning/Environment Level of [facility]: Needs assistance        Comments: home instead was helping with adls and getting out of bed. They came 2x day        OT Problem List: Decreased strength;Decreased activity tolerance;Decreased safety awareness;Decreased knowledge of use of DME or AE;Decreased knowledge of precautions;Pain(balance NT)      OT Treatment/Interventions: Self-care/ADL training;Therapeutic exercise;Energy conservation;DME and/or AE instruction;Therapeutic  activities;Patient/family education(likely balance )    OT Goals(Current goals can be found in the care plan section) Acute Rehab OT Goals Patient Stated Goal: To feel better.  OT Goal Formulation: With patient Time For Goal Achievement: 01/30/18 Potential to Achieve Goals: Good ADL Goals Pt Will Transfer to Toilet: with min assist;stand pivot transfer;with transfer board;bedside commode(depending upon weight bearing status) Additional ADL Goal #1: pt will sit eob and perform UB adls and grooming with supervision Additional ADL Goal #2: pt will perform bed mobility at supervision level in preparation for adls Additional ADL Goal #3: pt will be independent with theraband HEP  OT Frequency: Min 2X/week   Barriers to D/C:            Co-evaluation              AM-PAC OT "6 Clicks" Daily Activity     Outcome Measure Help from another person eating meals?: A Little Help from another person taking care of personal grooming?: A Lot Help from another person toileting, which includes using toliet, bedpan, or urinal?: Total Help from another person bathing (including washing, rinsing, drying)?: A Lot Help from another person to put on and taking off regular upper body clothing?: A Lot Help from another person to put on and taking off regular lower body clothing?: Total 6 Click Score: 11   End of Session    Activity Tolerance: (limited eval by tolerance/not feeling well) Patient left: with call bell/phone within reach;in bed  OT Visit Diagnosis: Muscle weakness (generalized) (M62.81);Pain(throughout)                Time: 8657-8469 OT Time Calculation (min): 18 min Charges:  OT General Charges $OT Visit: 1 Visit OT Evaluation $OT Eval Low Complexity: [place], OTR/L Acute Rehabilitation Services [phone] [facility] pager [phone] office 01/16/2018  Nathanuel Cabreja 01/16/2018, 11:29 AM

## 2018-01-16 NOTE — Progress Notes (Signed)
[facility] PROGRESS NOTE  Ebony Scott NOM:[id number] DOB: [birth year] DOA: 01/14/2018  PCP: [name], MD  Brief History/Interval Summary: 67 y.o. female with medical history significant of allergic rhinitis, seasonal allergies, asthma, anemia, anxiety, depression, osteoarthritis, pseudogout, breast cancer in remission, cataracts, chronic lower back pain, chronic neck pain, bilateral DVTs, diverticulosis, fibromyalgia, GERD, Graves' disease, hyperthyroidism, headache, history of hemorrhoids, IBS, osteoporosis, history of pneumonia who was recently admitted to [facility] acute PE and DVT.  She was started on anticoagulation.  Discharged to skilled nursing facility.  She did mention that she has been having diarrhea ever since that last hospitalization but presented with worsening GI symptoms with nausea vomiting and diarrhea.  She was found to be febrile.  C. difficile antigen was positive.  She was hospitalized for further management.  Reason for Visit: Possible C. difficile colitis.  Bilateral metatarsal fractures  Consultants: Phone discussion with Dr. Sharol Given with orthopedics  Procedures: None  Antibiotics: Oral vancomycin  Subjective/Interval History: Patient continues to to have diarrhea although she tells me that it appears to be slowing down.  Only 2 episodes since last night.  Abdominal pain has improved.  She does complain of generalized body aches.    ROS: Denies any nausea or vomiting.  Objective:  Vital Signs  Vitals:   01/15/18 2249 01/16/18 0610 01/16/18 0814 01/16/18 0820  BP: 135/71 137/80    Pulse: 89 87    Resp: 16 20    Temp: 99.8 F (37.7 C) (!) 100.7 F (38.2 C)    TempSrc: Oral Oral    SpO2: 99% 97% 95% 95%  Weight:      Height:        Intake/Output Summary (Last 24 hours) at 01/16/2018 1201 Last data filed at 01/16/2018 1000 Gross per 24 hour  Intake 1581.52 ml  Output 750 ml  Net 831.52 ml   Filed Weights   01/14/18 1632 01/15/18 0828    Weight: 60.4 kg 60.2 kg    General appearance: Awake alert.  In no distress Resp: Normal effort at rest.  Clear to auscultation bilaterally Cardio: S1-S2 is normal regular.  No S3-S4.  No rubs murmurs or bruit GI: Abdomen is soft.  nondistended.  Less tender today compared to yesterday.  No rebound rigidity or guarding.  Bowel sounds present normal.  No masses organomegaly Extremities: No edema noted bilateral lower extremity Neurologic: Alert and oriented x3.  No focal neurological deficits.  Lab Results:  Data Reviewed: I have personally reviewed following labs and imaging studies  CBC: Recent Labs  Lab 01/14/18 1707 01/15/18 0902 01/16/18 0453  WBC 9.5 8.0 7.9  NEUTROABS 6.1 5.2  --   HGB 10.3* 8.8* 8.9*  HCT 33.3* 28.6* 29.0*  MCV 104.7* 103.6* 103.6*  PLT 284 247 962    Basic Metabolic Panel: Recent Labs  Lab 01/14/18 1707 01/15/18 0902 01/16/18 0453  NA 138 139 138  K 3.5 4.1 4.2  CL 103 111 111  CO2 23 21* 19*  GLUCOSE 108* 89 90  BUN 12 10 10   CREATININE 1.01* 0.87 0.93  CALCIUM 8.2* 7.4* 7.7*    GFR: Estimated Creatinine Clearance: 47.6 mL/min (by C-G formula based on SCr of 0.93 mg/dL).  Liver Function Tests: Recent Labs  Lab 01/14/18 1707 01/15/18 0902  AST 69* 52*  ALT 61* 51*  ALKPHOS 70 62  BILITOT 1.8* 1.6*  PROT 7.3 5.8*  ALBUMIN 3.3* 2.6*     Recent Results (from the past 240 hour(s))  Blood Culture (routine x 2)     Status: None (Preliminary result)   Collection Time: 01/14/18  5:07 PM  Result Value Ref Range Status   Specimen Description   Final    LEFT ANTECUBITAL Blood Culture results may not be optimal due to an excessive volume of blood received in culture bottles BOTTLES DRAWN AEROBIC ONLY Performed at [place] [address] [place], [place] [address]    Special Requests   Final    NONE Performed at [facility], [place] [address], [place], [place] [address]    Culture   Final     NO GROWTH 2 DAYS Performed at [place] [address], [place], [place] [address]    Report Status PENDING  Incomplete  Blood Culture (routine x 2)     Status: None (Preliminary result)   Collection Time: 01/14/18  5:17 PM  Result Value Ref Range Status   Specimen Description   Final    RIGHT ANTECUBITAL Blood Culture results may not be optimal due to an excessive volume of blood received in culture bottles BOTTLES DRAWN AEROBIC AND ANAEROBIC Performed at [place] [address], [place], [place] [address]    Special Requests   Final    NONE Performed at [facility], [place] [address], [place], [place] [address]    Culture   Final    NO GROWTH 2 DAYS Performed at [place] [address], [place], [place] [address]    Report Status PENDING  Incomplete  Urine culture     Status: Abnormal   Collection Time: 01/14/18  6:45 PM  Result Value Ref Range Status   Specimen Description   Final    URINE, CLEAN CATCH Performed at [facility], [place] [address], [place], [place] [address]    Special Requests   Final    NONE Performed at [facility], [place] [address], [place], [place] [address]    Culture MULTIPLE SPECIES PRESENT, SUGGEST RECOLLECTION (A)  Final   Report Status 01/16/2018 FINAL  Final  Gastrointestinal Panel by PCR , Stool     Status: None   Collection Time: 01/14/18  7:03 PM  Result Value Ref Range Status   Campylobacter species NOT DETECTED NOT DETECTED Final   Plesimonas shigelloides NOT DETECTED NOT DETECTED Final   Salmonella species NOT DETECTED NOT DETECTED Final   Yersinia enterocolitica NOT DETECTED NOT DETECTED Final   Vibrio species NOT DETECTED NOT DETECTED Final   Vibrio cholerae NOT DETECTED NOT DETECTED Final   Enteroaggregative E coli (EAEC) NOT DETECTED NOT DETECTED Final   Enteropathogenic E coli (EPEC) NOT DETECTED NOT DETECTED Final   Enterotoxigenic  E coli (ETEC) NOT DETECTED NOT DETECTED Final   Shiga like toxin producing E coli (STEC) NOT DETECTED NOT DETECTED Final   Shigella/Enteroinvasive E coli (EIEC) NOT DETECTED NOT DETECTED Final   Cryptosporidium NOT DETECTED NOT DETECTED Final   Cyclospora cayetanensis NOT DETECTED NOT DETECTED Final   Entamoeba histolytica NOT DETECTED NOT DETECTED Final   Giardia lamblia NOT DETECTED NOT DETECTED Final   Adenovirus F40/41 NOT DETECTED NOT DETECTED Final   Astrovirus NOT DETECTED NOT DETECTED Final   Norovirus GI/GII NOT DETECTED NOT DETECTED Final   Rotavirus A NOT DETECTED NOT DETECTED Final   Sapovirus (I, II, IV, and V) NOT DETECTED NOT DETECTED Final    Comment: Performed at [facility], [address] [place], [place] [address]  C difficile quick scan w PCR reflex     Status: Abnormal   Collection Time: 01/14/18  7:03 PM  Result Value Ref Range Status   C Diff antigen POSITIVE (A) NEGATIVE Final   C Diff toxin NEGATIVE NEGATIVE Final   C Diff interpretation Results are indeterminate. See PCR results.  Final    Comment: Performed at [facility], [place] [address], [place], [place] [address]  C. Diff by PCR, Reflexed     Status: Abnormal   Collection Time: 01/14/18  7:03 PM  Result Value Ref Range Status   Toxigenic C. Difficile by PCR POSITIVE (A) NEGATIVE Final    Comment: Positive for toxigenic C. difficile with little to no toxin production. Only treat if clinical presentation suggests symptomatic illness. Performed at [facility], [place] [address], [place],  [address]       Radiology Studies: Dg Chest 2 View  Result Date: 01/14/2018 CLINICAL DATA:  Weakness over the last 4 days, some shortness of breath, history of breast carcinoma diagnosed in 1998 EXAM: CHEST - 2 VIEW COMPARISON:  Chest x-ray 12/17/2017 FINDINGS: No active infiltrate is seen and there is no evidence of pleural effusion. Mediastinal and hilar contours are  unremarkable. Mild cardiomegaly is stable. No acute bony abnormality is seen. IMPRESSION: Stable mild cardiomegaly.  No active lung disease. Electronically Signed   By: Ivar Drape M.D.   On: 01/14/2018 17:09   Dg Ankle 2 Views Right  Result Date: 01/14/2018 CLINICAL DATA:  Right foot and ankle pain. EXAM: RIGHT ANKLE - 2 VIEW COMPARISON:  Ankle radiograph 06/10/2012 FINDINGS: There is no evidence of fracture or dislocation. Ankle mortise is preserved. Mild talonavicular degenerative change. Plantar calcaneal spur and Achilles tendon enthesophyte. No bony destructive change or periosteal reaction. Diffuse soft tissue edema about the ankle. Increased density anteriorly may be generalized soft tissue edema or small effusion. IMPRESSION: 1. Diffuse soft tissue edema about the ankle. Focal soft tissue density anteriorly may be joint effusion or more generalized subcutaneous edema. 2. No acute osseous abnormality. Electronically Signed   By: Keith Rake M.D.   On: 01/14/2018 22:39   Dg Ankle Complete Left  Result Date: 01/14/2018 CLINICAL DATA:  Weakness over 4 days, left foot and ankle pain EXAM: LEFT ANKLE COMPLETE - 3+ VIEW COMPARISON:  Left ankle films of 12/16/2016 FINDINGS: The left ankle joint appears normal. Alignment is normal. No acute abnormality is seen. Again the nonunited fracture through the base of the left fifth metatarsal is present. IMPRESSION: Negative left ankle. Ninety night and fracture through the base of the left fifth metatarsal. Electronically Signed   By: Ivar Drape M.D.   On: 01/14/2018 17:12   Ct Abdomen Pelvis W Contrast  Result Date: 01/15/2018 CLINICAL DATA:  nausea vomiting and profuse diarrhea. Stool studies were done. C. difficile antigen was positive. Toxin negative. PCR positive. GI pathogen panel pending. Patient with abdominal tenderness on examination. Her WBC is normal. She was febrile last night. EXAM: CT ABDOMEN AND PELVIS WITH CONTRAST TECHNIQUE:  Multidetector CT imaging of the abdomen and pelvis was performed using the standard protocol following bolus administration of intravenous contrast. CONTRAST:  171mL OMNIPAQUE IOHEXOL 300 MG/ML SOLN, 6mL OMNIPAQUE IOHEXOL 300 MG/ML SOLN COMPARISON:  03/31/2007 FINDINGS: Lower chest: Scattered coronary calcifications. No pleural or pericardial effusion. Subpleural linear scarring or atelectasis posteriorly in both lower lobes. Hepatobiliary: No focal liver abnormality is seen. Status post cholecystectomy. No biliary dilatation.  Pancreas: Unremarkable. No pancreatic ductal dilatation or surrounding inflammatory changes. Spleen: Normal in size without focal abnormality. Adrenals/Urinary Tract: Normal adrenals. No renal mass or hydronephrosis. No ureterectasis. Urinary bladder physiologically distended. Stomach/Bowel: Stomach and small bowel decompressed. Appendix surgically absent. The colon is nondilated, unremarkable. Vascular/Lymphatic: Scattered aortoiliac calcified plaque without aneurysm or stenosis. Portal vein patent. No abdominal or pelvic adenopathy. Reproductive: Status post hysterectomy. No adnexal masses. Other: No ascites.  No free air. Musculoskeletal: Mild spondylitic changes in the lower lumbar spine. No fracture or other acute bone abnormality. IMPRESSION: 1. No acute abdominal findings. 2. Coronary and aortoiliac  atherosclerosis (ICD10-170.0). Electronically Signed   By: Lucrezia Europe M.D.   On: 01/15/2018 17:17   Dg Foot 2 Views Right  Result Date: 01/14/2018 CLINICAL DATA:  Right foot and ankle pain. EXAM: RIGHT FOOT - 2 VIEW COMPARISON:  Foot radiograph 02/20/2012 FINDINGS: Suspected incomplete transverse fracture of the proximal fifth metatarsal about the lateral cortex. No other fracture. Small osseous density about delay lateral cuboid is likely an accessory ossicle. Minimal hallux valgus, alignment is otherwise maintained. No periosteal reaction or bony destructive change. Plantar  calcaneal spur and Achilles tendon enthesophyte. Diffuse soft tissue edema. IMPRESSION: Suspected incomplete transverse fracture of the proximal fifth metatarsal shaft. Electronically Signed   By: Keith Rake M.D.   On: 01/14/2018 22:42   Dg Foot Complete Left  Result Date: 01/14/2018 CLINICAL DATA:  Weakness over the last 4 days, left foot and ankle pain, history of breast carcinoma diagnosed in 1998 EXAM: LEFT FOOT - COMPLETE 3+ VIEW COMPARISON:  Left foot films of 11/06/2016 FINDINGS: There is incomplete healing of the previously noted transverse fracture through the base of the left fifth metatarsal. The fracture line is still somewhat visible. No acute fracture is seen. Alignment is normal. No erosion is seen. IMPRESSION: Incomplete healing of the previously diagnosed transverse fracture through the base of the left fifth metatarsal. Electronically Signed   By: Ivar Drape M.D.   On: 01/14/2018 17:10   Dg Hip Port Unilat With Pelvis 1v Left  Result Date: 01/15/2018 CLINICAL DATA:  Left hip pain EXAM: DG HIP (WITH OR WITHOUT PELVIS) 1V PORT LEFT COMPARISON:  None. FINDINGS: Early spurring in the hip joints bilaterally. SI joints are symmetric and unremarkable. No acute bony abnormality. Specifically, no fracture, subluxation, or dislocation. IMPRESSION: Early spurring in the hip joints bilaterally. No acute bony abnormality. Electronically Signed   By: Rolm Baptise M.D.   On: 01/15/2018 11:07     Medications:  Scheduled: . calcium carbonate  1 tablet Oral BID WC  . cholecalciferol  1,000 Units Oral Daily  . famotidine  20 mg Oral QHS  . feeding supplement (GLUCERNA SHAKE)  237 mL Oral TID BM  . feeding supplement (PRO-STAT SUGAR FREE 64)  30 mL Oral TID WC  . ferrous sulfate  325 mg Oral Q breakfast  . gabapentin  300 mg Oral BID  . gabapentin  600 mg Oral QHS  . lactase  6,000 Units Oral TID WC  . lipase/protease/amylase  24,000 Units Oral TID WC  . mirtazapine  30 mg Oral QHS  .  mometasone-formoterol  1 puff Inhalation BID  . montelukast  10 mg Oral Daily  . nystatin  5 mL Mouth/Throat QHS  . pantoprazole  40 mg Oral Daily  . rivaroxaban  20 mg Oral Q supper  . saccharomyces boulardii  250 mg Oral BID  . umeclidinium bromide  1 puff Inhalation Daily  . vancomycin  125 mg Oral QID   Continuous: . 0.9 % NaCl with KCl 20 mEq / L 50 mL/hr at 01/15/18 2255   PFX:[id number] **OR** acetaminophen, hydrocortisone, ondansetron **OR** ondansetron (ZOFRAN) IV, simethicone, sodium chloride, sorbitol, traMADol    Assessment/Plan:  C. difficile colitis Patient presented with nausea vomiting and profuse diarrhea.  Stool studies were done.  C. difficile antigen was positive.  Toxin negative.  PCR positive.  GI pathogen panel unremarkable.  Since patient had significant abdominal tenderness a CT scan was done.  This did not show any acute findings.  Diarrhea hopefully is slowing down now.  Continue oral vancomycin.  She was noted to be febrile yesterday.   Close fracture of the fifth metatarsal bone of the right foot as well as the left The patient is supposed to follow-up with orthopedic surgeon, Dr. Sharol Given next week.  Her appointment is on 12/4.  Discussed with Dr. Sharol Given today who recommends postop shoes bilaterally and weightbearing as tolerated.  PT and OT evaluation.  No hip fracture noted on imaging studies.  Spurring was noted.   History of chronic diastolic CHF Patient was somewhat volume depleted.  Cut back on IV fluids if diarrhea continues to improve.  Chronic kidney disease stage III Renal function improved compared to last month.  Continue to monitor urine output.    Macrocytic anemia Drop in hemoglobin likely dilutional.  No evidence of overt bleeding.  Anemia panel from a month ago reviewed.  No clear-cut deficiencies identified.  TSH was 5.0 in October.  Hemoglobin remains stable.  History of acute PE and DVT diagnosed recently Continue Xarelto  History of  depression  continue Remeron  History of pseudogout Colchicine as needed  Hyperthyroidism No longer on methimazole. TSH was 5.0 in October.  Outpatient monitoring.  Hyperbilirubinemia  She had right upper quadrant ultrasound last month which did not show any acute findings.  She is status post cholecystectomy.  Severe protein calorie malnutrition Nutritional supplements   DVT Prophylaxis: On Xarelto    Code Status: Full code Family Communication: Discussed with the patient Disposition Plan: Management as outlined above.  PT and OT evaluation.  See discussion above.    LOS: 2 days   [facility] Pager [phone] 01/16/2018, 12:01 PM  If 7PM-7AM, please contact night-coverage at [email or web address], password [facility]

## 2018-01-17 ENCOUNTER — Inpatient Hospital Stay (HOSPITAL_COMMUNITY): Payer: Medicare Other

## 2018-01-17 ENCOUNTER — Encounter (HOSPITAL_COMMUNITY): Payer: Self-pay

## 2018-01-17 DIAGNOSIS — M25552 Pain in left hip: Secondary | ICD-10-CM

## 2018-01-17 LAB — COMPREHENSIVE METABOLIC PANEL
ALT: 48 U/L — ABNORMAL HIGH (ref 0–44)
AST: 42 U/L — ABNORMAL HIGH (ref 15–41)
Albumin: 2.5 g/dL — ABNORMAL LOW (ref 3.5–5.0)
Alkaline Phosphatase: 64 U/L (ref 38–126)
Anion gap: 10 (ref 5–15)
BUN: 13 mg/dL (ref 8–23)
CO2: 21 mmol/L — ABNORMAL LOW (ref 22–32)
Calcium: 8.5 mg/dL — ABNORMAL LOW (ref 8.9–10.3)
Chloride: 107 mmol/L (ref 98–111)
Creatinine, Ser: 0.85 mg/dL (ref 0.44–1.00)
GFR calc Af Amer: >60 mL/min (ref >60)
GFR calc non Af Amer: >60 mL/min (ref >60)
Glucose, Bld: 96 mg/dL (ref 70–99)
Potassium: 4.5 mmol/L (ref 3.5–5.1)
Sodium: 138 mmol/L (ref 135–145)
Total Bilirubin: 0.8 mg/dL (ref 0.3–1.2)
Total Protein: 6.2 g/dL — ABNORMAL LOW (ref 6.5–8.1)

## 2018-01-17 LAB — CBC
HCT: 30.4 % — ABNORMAL LOW (ref 36.0–46.0)
Hemoglobin: 9.3 g/dL — ABNORMAL LOW (ref 12.0–15.0)
MCH: 32.1 pg (ref 26.0–34.0)
MCHC: 30.6 g/dL (ref 30.0–36.0)
MCV: 104.8 fL — ABNORMAL HIGH (ref 80.0–100.0)
Platelets: 274 10*3/uL (ref 150–400)
RBC: 2.9 MIL/uL — ABNORMAL LOW (ref 3.87–5.11)
RDW: 14.8 % (ref 11.5–15.5)
WBC: 9.9 10*3/uL (ref 4.0–10.5)
nRBC: 0 % (ref 0.0–0.2)

## 2018-01-17 MED ORDER — LORAZEPAM 2 MG/ML IJ SOLN: 0.5000 mg | Freq: Once | INTRAMUSCULAR | Status: DC | PRN

## 2018-01-17 MED ORDER — IBUPROFEN 200 MG PO TABS
600.0000 mg | ORAL_TABLET | Freq: Three times a day (TID) | ORAL | Status: AC
  Administered 2018-01-17 – 2018-01-19 (×6): 600 mg via ORAL
  Filled 2018-01-17 (×6): qty 3

## 2018-01-17 NOTE — Evaluation (Signed)
Physical Therapy Evaluation Patient Details Name: Ebony Scott MRN: [id number] DOB: June 03, 1950 Today's Date: 01/17/2018   History of Present Illness  pt was admitted with nausea/vomiting/diarrhea.  She was discharged to SNF earlier this month after hospitalization at [facility] and  home from [facility] 01/08/18 with HHPT/OT and home instead (2x day).  PMH:  anxiety, depression, breast CA, chronic neck and low back pain, bil DVTs, fibromyalgia; xrays of ankles on this admission revealed bil 5th metatarsal fxs; MRI of left  hip pending at time of PT eval  Clinical Impression  Pt admitted with above diagnosis. Pt currently with functional limitations due to the deficits listed below (see PT Problem List).  eval limited d/t pending MRI to evaluate left hip pain; pt presents with significant weakness now exacerbated by bed rest; will continue to follow in acute setting; recommend SNF vs home pending progress as pt has  Limited home support;  Pt will benefit from skilled PT to increase their independence and safety with mobility to allow discharge to the venue listed below.        Follow Up Recommendations SNF;Home health PT(pending progress)    Equipment Recommendations  None recommended by PT    Recommendations for Other Services       Precautions / Restrictions Precautions Precautions: Fall Precaution Comments: left hip pain--MRI pending Restrictions Weight Bearing Restrictions: No Other Position/Activity Restrictions: bil WBAT with post op shoes      Mobility  Bed Mobility               General bed mobility comments: pt able to roll with min assist with OT, roll initiated however only partial roll d/t left hip pain (pt has been turning to use the bed pan with nursing)  Transfers                    Ambulation/Gait                Stairs            Wheelchair Mobility    Modified Rankin (Stroke Patients Only)       Balance                                             Pertinent Vitals/Pain Pain Assessment: Faces Faces Pain Scale: Hurts a little bit Pain Location: all over Pain Descriptors / Indicators: Discomfort;Sore Pain Intervention(s): Monitored during session    Home Living Family/patient expects to be discharged to:: Unsure Living Arrangements: Alone   Type of Home: House Home Access: Stairs to enter Entrance Stairs-Rails: Left Entrance Stairs-Number of Steps: 14 Home Layout: Two level;Able to live on main level with bedroom/bathroom Home Equipment: Gilford Rile - 2 wheels;Bedside commode;Shower seat - built in;Grab bars - tub/shower Additional Comments: daughter willing to assist at [employer] per pt--dtr lives in [place]    Prior Function           Comments: prior to this adm home instead was helping with adls and getting out of bed. They came 2x day     Hand Dominance        Extremity/Trunk Assessment   Upper Extremity Assessment Upper Extremity Assessment: Defer to OT evaluation    Lower Extremity Assessment Lower Extremity Assessment: RLE deficits/detail RLE Deficits / Details: AROM grossly WFL; strength at least 3+/5 LLE Deficits / Details: df 1/5, pf 2/5,  PROM limited, unable to bring ankle to neutral passively; hip NT d/t pending MRI/painful       Communication   Communication: No difficulties  Cognition Arousal/Alertness: Awake/alert Behavior During Therapy: WFL for tasks assessed/performed Overall Cognitive Status: Within Functional Limits for tasks assessed                                        General Comments      Exercises     Assessment/Plan    PT Assessment Patient needs continued PT services  PT Problem List Pain;Decreased knowledge of use of DME;Decreased mobility;Decreased activity tolerance;Decreased range of motion;Decreased strength       PT Treatment Interventions Modalities;Patient/family education;Therapeutic exercise;Therapeutic  activities;Functional mobility training;Gait training;DME instruction    PT Goals (Current goals can be found in the Care Plan section)  Acute Rehab PT Goals PT Goal Formulation: With patient Time For Goal Achievement: 01/31/18 Potential to Achieve Goals: Fair    Frequency Min 3X/week   Barriers to discharge        Co-evaluation               AM-PAC PT "6 Clicks" Mobility  Outcome Measure Help needed turning from your back to your side while in a flat bed without using bedrails?: A Lot Help needed moving from lying on your back to sitting on the side of a flat bed without using bedrails?: A Lot Help needed moving to and from a bed to a chair (including a wheelchair)?: A Lot Help needed standing up from a chair using your arms (e.g., wheelchair or bedside chair)?: A Lot Help needed to walk in hospital room?: Total Help needed climbing 3-5 steps with a railing? : Total 6 Click Score: 10    End of Session Equipment Utilized During Treatment: Gait belt Activity Tolerance: Patient limited by pain Patient left: in bed;with call bell/phone within reach   PT Visit Diagnosis: Unsteadiness on feet (R26.81);Other abnormalities of gait and mobility (R26.89);Muscle weakness (generalized) (M62.81);History of falling (Z91.81);Pain Pain - Right/Left: Left Pain - part of body: Hip;Ankle and joints of foot    Time: 1135-1145 PT Time Calculation (min) (ACUTE ONLY): 10 min   Charges:   PT Evaluation $PT Eval Low Complexity: 1 Low          Kenyon Ana, PT  Pager: [phone] Acute Rehab Dept [facility]): [phone]   01/17/2018   [facility] 01/17/2018, 1:10 PM

## 2018-01-17 NOTE — Telephone Encounter (Signed)
I was able to get thru to 2 reps today. The 1st rep transferred me to the 2nd one, order had already been set for del. Sometime in Dec.. I cancelled that order b/c pt has 2 doses on hand. Pt is currently in the hosp.. Nothing further needed.

## 2018-01-17 NOTE — Progress Notes (Signed)
[facility] PROGRESS NOTE  Ebony Scott FBP:[id number] DOB: [birth year] DOA: 01/14/2018  PCP: [name], MD  Brief History/Interval Summary: 67 y.o. female with medical history significant of allergic rhinitis, seasonal allergies, asthma, anemia, anxiety, depression, osteoarthritis, pseudogout, breast cancer in remission, cataracts, chronic lower back pain, chronic neck pain, bilateral DVTs, diverticulosis, fibromyalgia, GERD, Graves' disease, hyperthyroidism, headache, history of hemorrhoids, IBS, osteoporosis, history of pneumonia who was recently admitted to [facility] acute PE and DVT.  She was started on anticoagulation.  Discharged to skilled nursing facility.  She did mention that she has been having diarrhea ever since that last hospitalization but presented with worsening GI symptoms with nausea vomiting and diarrhea.  She was found to be febrile.  C. difficile antigen was positive.  She was hospitalized for further management.  Patient was started on oral vancomycin.  She continues to have diarrhea.  Continues to have fever.  Reason for Visit: C. difficile.  Bilateral metatarsal fractures  Consultants: Phone discussion with Dr. Sharol Given with orthopedics  Procedures: None  Antibiotics: Oral vancomycin 11/26  Subjective/Interval History: Continues to feel unwell.  She says she had multiple loose stools yesterday.  Total about 7 over the last 24 hours.  Per nursing staff she has not had any over the course of the night.  Patient continues to have significant pain in her left hip which she states is completely new for her.  She also mentions a cough with yellowish sputum.    ROS: She denies any nausea or vomiting.  Objective:  Vital Signs  Vitals:   01/16/18 1404 01/16/18 1525 01/16/18 2055 01/17/18 0600  BP: (!) 154/76  (!) 150/74 (!) 146/72  Pulse: 96  93 87  Resp: 17  (!) 22 (!) 21  Temp: (!) 103 F (39.4 C) (!) 102.6 F (39.2 C) (!) 102.6 F (39.2 C) 99.5 F (37.5 C)   TempSrc: Oral Oral Oral Oral  SpO2: 94%  97% 96%  Weight:      Height:        Intake/Output Summary (Last 24 hours) at 01/17/2018 1139 Last data filed at 01/17/2018 0200 Gross per 24 hour  Intake 1032.67 ml  Output 800 ml  Net 232.67 ml   Filed Weights   01/14/18 1632 01/15/18 0828  Weight: 60.4 kg 60.2 kg    General appearance: She is awake alert.  In no distress Resp: Normal effort at rest.  Perhaps a few crackles in the left lower lung.  No wheezing or rhonchi. Cardio: S1-S2 is normal regular.  No S3-S4.  No rubs murmurs or bruit GI: Abdomen remains soft.  Nondistended.  Nontender today on palpation.  Bowel sounds present normal.  No masses organomegaly.   Extremities: No edema noted.  Restricted range of motion of the left hip.  Painful. Neurologic: Alert and oriented x3.  No focal neurological deficits.  Lab Results:  Data Reviewed: I have personally reviewed following labs and imaging studies  CBC: Recent Labs  Lab 01/14/18 1707 01/15/18 0902 01/16/18 0453 01/17/18 0501  WBC 9.5 8.0 7.9 9.9  NEUTROABS 6.1 5.2  --   --   HGB 10.3* 8.8* 8.9* 9.3*  HCT 33.3* 28.6* 29.0* 30.4*  MCV 104.7* 103.6* 103.6* 104.8*  PLT 284 247 256 824    Basic Metabolic Panel: Recent Labs  Lab 01/14/18 1707 01/15/18 0902 01/16/18 0453 01/17/18 0501  NA 138 139 138 138  K 3.5 4.1 4.2 4.5  CL 103 111 111 107  CO2 23 21*  19* 21*  GLUCOSE 108* 89 90 96  BUN 12 10 10 13   CREATININE 1.01* 0.87 0.93 0.85  CALCIUM 8.2* 7.4* 7.7* 8.5*    GFR: Estimated Creatinine Clearance: 52.1 mL/min (by C-G formula based on SCr of 0.85 mg/dL).  Liver Function Tests: Recent Labs  Lab 01/14/18 1707 01/15/18 0902 01/17/18 0501  AST 69* 52* 42*  ALT 61* 51* 48*  ALKPHOS 70 62 64  BILITOT 1.8* 1.6* 0.8  PROT 7.3 5.8* 6.2*  ALBUMIN 3.3* 2.6* 2.5*     Recent Results (from the past 240 hour(s))  Blood Culture (routine x 2)     Status: None (Preliminary result)   Collection Time:  01/14/18  5:07 PM  Result Value Ref Range Status   Specimen Description   Final    LEFT ANTECUBITAL Blood Culture results may not be optimal due to an excessive volume of blood received in culture bottles BOTTLES DRAWN AEROBIC ONLY Performed at [facility] [address] [place], [place] [address]    Special Requests   Final    NONE Performed at [facility], [place] [address], [place], [place] [address]    Culture   Final    NO GROWTH 3 DAYS Performed at [facility], [place] [address], [place], [place] [address]    Report Status PENDING  Incomplete  Blood Culture (routine x 2)     Status: None (Preliminary result)   Collection Time: 01/14/18  5:17 PM  Result Value Ref Range Status   Specimen Description   Final    RIGHT ANTECUBITAL Blood Culture results may not be optimal due to an excessive volume of blood received in culture bottles BOTTLES DRAWN AEROBIC AND ANAEROBIC Performed at [facility] [address], [place], [place] [address]    Special Requests   Final    NONE Performed at [facility], [place] [address], [place], [place] [address]    Culture   Final    NO GROWTH 3 DAYS Performed at [facility], [place] [address], [place], [place] [address]    Report Status PENDING  Incomplete  Urine culture     Status: Abnormal   Collection Time: 01/14/18  6:45 PM  Result Value Ref Range Status   Specimen Description   Final    URINE, CLEAN CATCH Performed at [facility], [place] [address], [place], [place] [address]    Special Requests   Final    NONE Performed at [facility], [place] [address] [place], [place] [address]    Culture MULTIPLE SPECIES PRESENT, SUGGEST RECOLLECTION (A)  Final   Report Status 01/16/2018 FINAL  Final  Gastrointestinal Panel by PCR , Stool     Status: None   Collection Time: 01/14/18  7:03 PM  Result Value Ref Range Status    Campylobacter species NOT DETECTED NOT DETECTED Final   Plesimonas shigelloides NOT DETECTED NOT DETECTED Final   Salmonella species NOT DETECTED NOT DETECTED Final   Yersinia enterocolitica NOT DETECTED NOT DETECTED Final   Vibrio species NOT DETECTED NOT DETECTED Final   Vibrio cholerae NOT DETECTED NOT DETECTED Final   Enteroaggregative E coli (EAEC) NOT DETECTED NOT DETECTED Final   Enteropathogenic E coli (EPEC) NOT DETECTED NOT DETECTED Final   Enterotoxigenic E coli (ETEC) NOT DETECTED NOT DETECTED Final   Shiga like toxin producing E coli (STEC) NOT DETECTED NOT DETECTED Final   Shigella/Enteroinvasive E coli (EIEC)  NOT DETECTED NOT DETECTED Final   Cryptosporidium NOT DETECTED NOT DETECTED Final   Cyclospora cayetanensis NOT DETECTED NOT DETECTED Final   Entamoeba histolytica NOT DETECTED NOT DETECTED Final   Giardia lamblia NOT DETECTED NOT DETECTED Final   Adenovirus F40/41 NOT DETECTED NOT DETECTED Final   Astrovirus NOT DETECTED NOT DETECTED Final   Norovirus GI/GII NOT DETECTED NOT DETECTED Final   Rotavirus A NOT DETECTED NOT DETECTED Final   Sapovirus (I, II, IV, and V) NOT DETECTED NOT DETECTED Final    Comment: Performed at [facility], [place] [place], [place] [address]  C difficile quick scan w PCR reflex     Status: Abnormal   Collection Time: 01/14/18  7:03 PM  Result Value Ref Range Status   C Diff antigen POSITIVE (A) NEGATIVE Final   C Diff toxin NEGATIVE NEGATIVE Final   C Diff interpretation Results are indeterminate. See PCR results.  Final    Comment: Performed at [facility], [place] [address], [place], [place] [address]  C. Diff by PCR, Reflexed     Status: Abnormal   Collection Time: 01/14/18  7:03 PM  Result Value Ref Range Status   Toxigenic C. Difficile by PCR POSITIVE (A) NEGATIVE Final    Comment: Positive for toxigenic C. difficile with little to no toxin production. Only treat if clinical presentation suggests  symptomatic illness. Performed at [facility], [place] [address], [place], [place] [address]   Culture, blood (Routine X 2) w Reflex to ID Panel     Status: None (Preliminary result)   Collection Time: 01/16/18  2:35 PM  Result Value Ref Range Status   Specimen Description   Final    BLOOD RIGHT ANTECUBITAL Performed at [facility] [address], [place], [place] [address]    Special Requests   Final    BOTTLES DRAWN AEROBIC AND ANAEROBIC Blood Culture adequate volume Performed at [facility] [address] [place], [place] [address]    Culture   Final    NO GROWTH < 12 HOURS Performed at [facility] [address], [place], [place] [address]    Report Status PENDING  Incomplete  Culture, blood (Routine X 2) w Reflex to ID Panel     Status: None (Preliminary result)   Collection Time: 01/16/18  2:40 PM  Result Value Ref Range Status   Specimen Description   Final    BLOOD RIGHT ARM Performed at [facility] [address] [place], [place] [address]    Special Requests   Final    BOTTLES DRAWN AEROBIC AND ANAEROBIC Blood Culture adequate volume Performed at [facility] [address] [place], [place] [address]    Culture   Final    NO GROWTH < 12 HOURS Performed at [facility] [address], [place], [place] [address]    Report Status PENDING  Incomplete      Radiology Studies: Ct Abdomen Pelvis W Contrast  Result Date: 01/15/2018 CLINICAL DATA:  nausea vomiting and profuse diarrhea. Stool studies were done. C. difficile antigen was positive. Toxin negative. PCR positive. GI pathogen panel pending. Patient with abdominal tenderness on examination. Her WBC is normal. She was febrile last night. EXAM: CT ABDOMEN AND PELVIS WITH CONTRAST TECHNIQUE: Multidetector CT imaging of the abdomen and pelvis was performed using the standard protocol following bolus administration of  intravenous contrast. CONTRAST:  168mL OMNIPAQUE IOHEXOL  300 MG/ML SOLN, 2mL OMNIPAQUE IOHEXOL 300 MG/ML SOLN COMPARISON:  03/31/2007 FINDINGS: Lower chest: Scattered coronary calcifications. No pleural or pericardial effusion. Subpleural linear scarring or atelectasis posteriorly in both lower lobes. Hepatobiliary: No focal liver abnormality is seen. Status post cholecystectomy. No biliary dilatation. Pancreas: Unremarkable. No pancreatic ductal dilatation or surrounding inflammatory changes. Spleen: Normal in size without focal abnormality. Adrenals/Urinary Tract: Normal adrenals. No renal mass or hydronephrosis. No ureterectasis. Urinary bladder physiologically distended. Stomach/Bowel: Stomach and small bowel decompressed. Appendix surgically absent. The colon is nondilated, unremarkable. Vascular/Lymphatic: Scattered aortoiliac calcified plaque without aneurysm or stenosis. Portal vein patent. No abdominal or pelvic adenopathy. Reproductive: Status post hysterectomy. No adnexal masses. Other: No ascites.  No free air. Musculoskeletal: Mild spondylitic changes in the lower lumbar spine. No fracture or other acute bone abnormality. IMPRESSION: 1. No acute abdominal findings. 2. Coronary and aortoiliac  atherosclerosis (ICD10-170.0). Electronically Signed   By: Lucrezia Europe M.D.   On: 01/15/2018 17:17   Dg Chest Port 1 View  Result Date: 01/17/2018 CLINICAL DATA:  Cough. EXAM: PORTABLE CHEST 1 VIEW COMPARISON:  Chest x-ray dated January 14, 2018. FINDINGS: The heart size and mediastinal contours are within normal limits. Normal pulmonary vascularity. No focal consolidation, pleural effusion, or pneumothorax. No acute osseous abnormality. Unchanged surgical clips in the left breast. IMPRESSION: No active disease. Electronically Signed   By: Titus Dubin M.D.   On: 01/17/2018 09:55     Medications:  Scheduled: . calcium carbonate  1 tablet Oral BID WC  . cholecalciferol  1,000 Units Oral Daily  .  feeding supplement (GLUCERNA SHAKE)  237 mL Oral TID BM  . feeding supplement (PRO-STAT SUGAR FREE 64)  30 mL Oral TID WC  . ferrous sulfate  325 mg Oral Q breakfast  . gabapentin  300 mg Oral BID  . lactase  6,000 Units Oral TID WC  . lipase/protease/amylase  24,000 Units Oral TID WC  . mirtazapine  30 mg Oral QHS  . mometasone-formoterol  1 puff Inhalation BID  . montelukast  10 mg Oral Daily  . nystatin  5 mL Mouth/Throat QHS  . rivaroxaban  20 mg Oral Q supper  . saccharomyces boulardii  250 mg Oral BID  . umeclidinium bromide  1 puff Inhalation Daily  . vancomycin  125 mg Oral QID   Continuous: . 0.9 % NaCl with KCl 20 mEq / L 50 mL/hr at 01/17/18 0200   GNF:AOZHYQMVHQION **OR** acetaminophen, hydrocortisone, LORazepam, ondansetron **OR** ondansetron (ZOFRAN) IV, simethicone, sodium chloride, sorbitol, traMADol    Assessment/Plan:  C. difficile colitis/persistent fever Patient presented with nausea vomiting and profuse diarrhea.  Stool studies were done.  C. difficile antigen was positive.  Toxin negative.  PCR positive.  GI pathogen panel unremarkable.  Since patient had significant abdominal tenderness a CT scan was done.  This did not show any acute findings.  Patient continues to have loose stool.  Continue with oral vancomycin for now.  Continues to have episodes of fever which is somewhat puzzling.  She does mention a cough and so a chest x-ray was done which did not show any acute findings.   WBC remains normal.  Close fracture of the fifth metatarsal bone bilaterally/left hip pain  The patient is supposed to follow-up with orthopedic surgeon, Dr. Sharol Given next week.  Her appointment is on 12/4.  Discussed with Dr. Sharol Given on 11/28 who recommends postop shoes bilaterally and weightbearing as tolerated.  PT and OT evaluation.  Spurring was noted. She continues  to mention pain in her left hip joint.  She had a x-ray which showed some spurring but no fractures were seen.  She had a CT  which did not show any fractures in that area.  Discussed with radiology this morning who reviewed the films again and did not see anything unusual.  Since patient does have significant limitation in range of motion and continues to have fever we will proceed with MRI of the left hip.   History of chronic diastolic CHF Initially patient was volume depleted and was given IV fluids.  Rate has been reduced.   Chronic kidney disease stage III Renal function improved compared to last month.  Continue to monitor urine output.    Macrocytic anemia Drop in hemoglobin likely dilutional.  No evidence of overt bleeding.  Anemia panel from a month ago reviewed.  No clear-cut deficiencies identified.  TSH was 5.0 in October.  Hemoglobin remains stable  History of acute PE and DVT diagnosed recently Continue Xarelto  History of depression  continue Remeron  History of pseudogout Colchicine as needed  Hyperthyroidism No longer on methimazole. TSH was 5.0 in October.  Outpatient monitoring.  Hyperbilirubinemia  She had right upper quadrant ultrasound last month which did not show any acute findings.  She is status post cholecystectomy.  Bilirubin level normal this morning.  Severe protein calorie malnutrition Nutritional supplements   DVT Prophylaxis: On Xarelto    Code Status: Full code Family Communication: Discussed with the patient Disposition Plan: Persistence of fever is puzzling.  X-ray of the chest unremarkable.  Proceed with MRI of the left hip.     LOS: 3 days   [facility] Pager [phone] 01/17/2018, 11:39 AM  If 7PM-7AM, please contact night-coverage at [email or web address], password [facility]

## 2018-01-17 NOTE — Care Management Important Message (Signed)
Important Message  Patient Details  Name: JUANA HARALSON MRN: [id number] Date of Birth: 09/10/1950   Medicare Important Message Given:  Yes    Kerin Salen 01/17/2018, 9:56 AMImportant Message  Patient Details  Name: JUANICE WARBURTON MRN: [id number] Date of Birth: February 16, 1951   Medicare Important Message Given:  Yes    Kerin Salen 01/17/2018, 9:56 AM

## 2018-01-18 ENCOUNTER — Inpatient Hospital Stay (HOSPITAL_COMMUNITY): Payer: Medicare Other

## 2018-01-18 LAB — CBC
HCT: 30.5 % — ABNORMAL LOW (ref 36.0–46.0)
Hemoglobin: 9.5 g/dL — ABNORMAL LOW (ref 12.0–15.0)
MCH: 32.2 pg (ref 26.0–34.0)
MCHC: 31.1 g/dL (ref 30.0–36.0)
MCV: 103.4 fL — ABNORMAL HIGH (ref 80.0–100.0)
Platelets: 314 10*3/uL (ref 150–400)
RBC: 2.95 MIL/uL — ABNORMAL LOW (ref 3.87–5.11)
RDW: 14.8 % (ref 11.5–15.5)
WBC: 10 10*3/uL (ref 4.0–10.5)
nRBC: 0 % (ref 0.0–0.2)

## 2018-01-18 MED ORDER — IOPAMIDOL (ISOVUE-300) INJECTION 61%
INTRAVENOUS | Status: AC
  Filled 2018-01-18: qty 50

## 2018-01-18 NOTE — Progress Notes (Signed)
[facility] PROGRESS NOTE  Ebony Scott GHW:[id number] DOB: 1950-07-30 DOA: 01/14/2018  PCP: [name], MD  Brief History/Interval Summary: 68 y.o. female with medical history significant of allergic rhinitis, seasonal allergies, asthma, anemia, anxiety, depression, osteoarthritis, pseudogout, breast cancer in remission, cataracts, chronic lower back pain, chronic neck pain, bilateral DVTs, diverticulosis, fibromyalgia, GERD, Graves' disease, hyperthyroidism, headache, history of hemorrhoids, IBS, osteoporosis, history of pneumonia who was recently admitted to [facility] acute PE and DVT.  She was started on anticoagulation.  Discharged to skilled nursing facility.  She did mention that she has been having diarrhea ever since that last hospitalization but presented with worsening GI symptoms with nausea vomiting and diarrhea.  She was found to be febrile.  C. difficile antigen was positive.  She was hospitalized for further management.  Patient was started on oral vancomycin.  She continues to have diarrhea.  Continues to have fever.  Reason for Visit: C. difficile.  Bilateral metatarsal fractures  Consultants: Phone discussion with Dr. Sharol Given with orthopedics  Procedures: None  Antibiotics: Oral vancomycin 11/26  Subjective/Interval History: Patient continues to have pain in her left hip.  Mentions that she has had 6 episodes of diarrhea since yesterday morning not corroborated by the nursing staff.  Charting suggested she only had 3 bowel movements yesterday.  Continues to have a cough with yellowish expectoration.  ROS: Denies any nausea or vomiting  Objective:  Vital Signs  Vitals:   01/17/18 1428 01/17/18 2004 01/17/18 2150 01/18/18 0536  BP:   (!) 116/58 136/72  Pulse:   79 87  Resp:   19 16  Temp: 99.9 F (37.7 C) 98.9 F (37.2 C) 98.9 F (37.2 C) 99.1 F (37.3 C)  TempSrc: Oral Oral Oral Oral  SpO2:   95% 100%  Weight:      Height:        Intake/Output  Summary (Last 24 hours) at 01/18/2018 0850 Last data filed at 01/18/2018 0601 Gross per 24 hour  Intake 1528.89 ml  Output 500 ml  Net 1028.89 ml   Filed Weights   01/14/18 1632 01/15/18 0828  Weight: 60.4 kg 60.2 kg    General appearance: She is awake alert.  In no distress Resp: Normal effort at rest.  Diminished air entry at the bases.  No definite wheezing rales or rhonchi Cardio: S1-S2 is normal regular.  No S3-4.  No rubs murmurs or bruit GI: Abdomen remains soft.  Nontender nondistended.  Bowel sounds present normal.  No masses organomegaly Extremities: Diminished range of motion of the left hip with tenderness on manipulation of the joint.  Especially with internal rotation. Neurologic: Alert and oriented x3.  No focal neurological deficits  Lab Results:  Data Reviewed: I have personally reviewed following labs and imaging studies  CBC: Recent Labs  Lab 01/14/18 1707 01/15/18 0902 01/16/18 0453 01/17/18 0501 01/18/18 0524  WBC 9.5 8.0 7.9 9.9 10.0  NEUTROABS 6.1 5.2  --   --   --   HGB 10.3* 8.8* 8.9* 9.3* 9.5*  HCT 33.3* 28.6* 29.0* 30.4* 30.5*  MCV 104.7* 103.6* 103.6* 104.8* 103.4*  PLT 284 247 256 274 789    Basic Metabolic Panel: Recent Labs  Lab 01/14/18 1707 01/15/18 0902 01/16/18 0453 01/17/18 0501  NA 138 139 138 138  K 3.5 4.1 4.2 4.5  CL 103 111 111 107  CO2 23 21* 19* 21*  GLUCOSE 108* 89 90 96  BUN 12 10 10 13   CREATININE 1.01* 0.87 0.93  0.85  CALCIUM 8.2* 7.4* 7.7* 8.5*    GFR: Estimated Creatinine Clearance: 52.1 mL/min (by C-G formula based on SCr of 0.85 mg/dL).  Liver Function Tests: Recent Labs  Lab 01/14/18 1707 01/15/18 0902 01/17/18 0501  AST 69* 52* 42*  ALT 61* 51* 48*  ALKPHOS 70 62 64  BILITOT 1.8* 1.6* 0.8  PROT 7.3 5.8* 6.2*  ALBUMIN 3.3* 2.6* 2.5*     Recent Results (from the past 240 hour(s))  Blood Culture (routine x 2)     Status: None (Preliminary result)   Collection Time: 01/14/18  5:07 PM  Result  Value Ref Range Status   Specimen Description   Final    LEFT ANTECUBITAL Blood Culture results may not be optimal due to an excessive volume of blood received in culture bottles BOTTLES DRAWN AEROBIC ONLY Performed at [facility] [address], [place], [place] [address]    Special Requests   Final    NONE Performed at [facility], [place] [address], [place], [place] [address]    Culture   Final    NO GROWTH 4 DAYS Performed at [facility], [place] [address], [place], [place] [address]    Report Status PENDING  Incomplete  Blood Culture (routine x 2)     Status: None (Preliminary result)   Collection Time: 01/14/18  5:17 PM  Result Value Ref Range Status   Specimen Description   Final    RIGHT ANTECUBITAL Blood Culture results may not be optimal due to an excessive volume of blood received in culture bottles BOTTLES DRAWN AEROBIC AND ANAEROBIC Performed at [facility] [address], [place], [place] [address]    Special Requests   Final    NONE Performed at [facility], [place] [address], [place], [place] [address]    Culture   Final    NO GROWTH 4 DAYS Performed at [facility], [place] [address] [place], [place] [address]    Report Status PENDING  Incomplete  Urine culture     Status: Abnormal   Collection Time: 01/14/18  6:45 PM  Result Value Ref Range Status   Specimen Description   Final    URINE, CLEAN CATCH Performed at [facility], [place] [address] [place], [place] [address]    Special Requests   Final    NONE Performed at [facility], [place] [address], [place], [place] [address]    Culture MULTIPLE SPECIES PRESENT, SUGGEST RECOLLECTION (A)  Final   Report Status 01/16/2018 FINAL  Final  Gastrointestinal Panel by PCR , Stool     Status: None   Collection Time: 01/14/18  7:03 PM  Result Value Ref Range Status   Campylobacter species NOT  DETECTED NOT DETECTED Final   Plesimonas shigelloides NOT DETECTED NOT DETECTED Final   Salmonella species NOT DETECTED NOT DETECTED Final   Yersinia enterocolitica NOT DETECTED NOT DETECTED Final   Vibrio species NOT DETECTED NOT DETECTED Final   Vibrio cholerae NOT DETECTED NOT DETECTED Final   Enteroaggregative E coli (EAEC) NOT DETECTED NOT DETECTED Final   Enteropathogenic E coli (EPEC) NOT DETECTED NOT DETECTED Final   Enterotoxigenic E coli (ETEC) NOT DETECTED NOT DETECTED Final   Shiga like toxin producing E coli (STEC) NOT DETECTED NOT DETECTED Final   Shigella/Enteroinvasive E coli (EIEC) NOT DETECTED NOT DETECTED Final   Cryptosporidium NOT DETECTED NOT DETECTED Final   Cyclospora cayetanensis NOT DETECTED  NOT DETECTED Final   Entamoeba histolytica NOT DETECTED NOT DETECTED Final   Giardia lamblia NOT DETECTED NOT DETECTED Final   Adenovirus F40/41 NOT DETECTED NOT DETECTED Final   Astrovirus NOT DETECTED NOT DETECTED Final   Norovirus GI/GII NOT DETECTED NOT DETECTED Final   Rotavirus A NOT DETECTED NOT DETECTED Final   Sapovirus (I, II, IV, and V) NOT DETECTED NOT DETECTED Final    Comment: Performed at [facility], [place] [place], [place] [address]  C difficile quick scan w PCR reflex     Status: Abnormal   Collection Time: 01/14/18  7:03 PM  Result Value Ref Range Status   C Diff antigen POSITIVE (A) NEGATIVE Final   C Diff toxin NEGATIVE NEGATIVE Final   C Diff interpretation Results are indeterminate. See PCR results.  Final    Comment: Performed at [name] [place] [address], [place], [place] [address]  C. Diff by PCR, Reflexed     Status: Abnormal   Collection Time: 01/14/18  7:03 PM  Result Value Ref Range Status   Toxigenic C. Difficile by PCR POSITIVE (A) NEGATIVE Final    Comment: Positive for toxigenic C. difficile with little to no toxin production. Only treat if clinical presentation suggests symptomatic  illness. Performed at [facility], [place] [address], [place], [place] [address]   Culture, blood (Routine X 2) w Reflex to ID Panel     Status: None (Preliminary result)   Collection Time: 01/16/18  2:35 PM  Result Value Ref Range Status   Specimen Description   Final    BLOOD RIGHT ANTECUBITAL Performed at [place] [address], [place], [place] [address]    Special Requests   Final    BOTTLES DRAWN AEROBIC AND ANAEROBIC Blood Culture adequate volume Performed at [facility] [address] [place], [place] [address]    Culture   Final    NO GROWTH 2 DAYS Performed at [place] [address], [place], [place] [address]    Report Status PENDING  Incomplete  Culture, blood (Routine X 2) w Reflex to ID Panel     Status: None (Preliminary result)   Collection Time: 01/16/18  2:40 PM  Result Value Ref Range Status   Specimen Description   Final    BLOOD RIGHT ARM Performed at [facility] [address], [place], [place] [address]    Special Requests   Final    BOTTLES DRAWN AEROBIC AND ANAEROBIC Blood Culture adequate volume Performed at [facility] [address], [place], [place] [address]    Culture   Final    NO GROWTH 2 DAYS Performed at [place] [address] [place], [place] [address]    Report Status PENDING  Incomplete      Radiology Studies: Mr Hip Left Wo Contrast  Result Date: 01/17/2018 CLINICAL DATA:  Left hip pain.  No trauma. EXAM: MR OF THE LEFT HIP WITHOUT CONTRAST TECHNIQUE: Multiplanar, multisequence MR imaging was performed. No intravenous contrast was administered. COMPARISON:  Left hip x-rays dated January 15, 2018. FINDINGS: Bones: Greater than right serpiginous signal changes within the left femoral heads, consistent with avascular necrosis. No significant surrounding marrow edema. No fracture or dislocation. No focal bone lesion. The  visualized sacroiliac joints and symphysis pubis appear normal. Articular cartilage and labrum Articular cartilage: No focal chondral defect or subchondral signal abnormality identified. Labrum: There  is no gross labral tear or paralabral abnormality. Joint or bursal effusion Joint effusion: Small left hip joint effusion. No right hip joint effusion. Bursae: No focal periarticular fluid collection. Muscles and tendons Muscles and tendons: Partial tear of the left gluteus minimus tendon. Mild bilateral gluteus medius tendinosis. Moderate bilateral hamstring tendinosis. The iliopsoas tendons are unremarkable. Prominent edema within the left gluteus minimus muscle. Mild edema within the bilateral adductor musculature and inferior left gluteus medius muscle. No muscle atrophy. Other findings Miscellaneous: The visualized internal pelvic contents appear unremarkable. IMPRESSION: 1. 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 2. Small left hip joint effusion, nonspecific. 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 If there is clinical concern for septic arthritis, arthrocentesis can be performed. 3. Partial tear of the left gluteus minimus tendon with significant muscle strain. 4. Mild bilateral gluteus medius and moderate bilateral hamstring tendinosis. Electronically Signed   By: Titus Dubin M.D.   On: 01/17/2018 13:19   Dg Chest Port 1 View  Result Date: 01/17/2018 CLINICAL DATA:  Cough. EXAM: PORTABLE CHEST 1 VIEW COMPARISON:  Chest x-ray dated January 14, 2018. FINDINGS: The heart size and mediastinal contours are within normal limits. Normal pulmonary vascularity. No focal consolidation, pleural effusion, or pneumothorax. No acute osseous abnormality. Unchanged surgical clips in the left breast. IMPRESSION: No active disease. Electronically Signed   By:  Titus Dubin M.D.   On: 01/17/2018 09:55     Medications:  Scheduled: . calcium carbonate  1 tablet Oral BID WC  . cholecalciferol  1,000 Units Oral Daily  . feeding supplement (GLUCERNA SHAKE)  237 mL Oral TID BM  . feeding supplement (PRO-STAT SUGAR FREE 64)  30 mL Oral TID WC  . ferrous sulfate  325 mg Oral Q breakfast  . gabapentin  300 mg Oral BID  . ibuprofen  600 mg Oral TID AC  . lactase  6,000 Units Oral TID WC  . lipase/protease/amylase  24,000 Units Oral TID WC  . mirtazapine  30 mg Oral QHS  . mometasone-formoterol  1 puff Inhalation BID  . montelukast  10 mg Oral Daily  . nystatin  5 mL Mouth/Throat QHS  . rivaroxaban  20 mg Oral Q supper  . saccharomyces boulardii  250 mg Oral BID  . umeclidinium bromide  1 puff Inhalation Daily  . vancomycin  125 mg Oral QID   Continuous: . 0.9 % NaCl with KCl 20 mEq / L 50 mL/hr at 01/17/18 1301   UDJ:SHFWYOVZCHYIF **OR** acetaminophen, hydrocortisone, LORazepam, ondansetron **OR** ondansetron (ZOFRAN) IV, simethicone, sodium chloride, sorbitol, traMADol    Assessment/Plan:  C. difficile colitis/persistent fever Patient presented with nausea vomiting and profuse diarrhea.  Stool studies were done.  C. difficile antigen was positive.  Toxin negative.  PCR positive.  GI pathogen panel unremarkable.  Since patient had significant abdominal tenderness a CT scan was done.  This did not show any acute findings.  Patient's diarrhea appears to be subsiding.  Not to large amount of stool per nursing staff.  Fever appears to be subsiding as well.  WBC remains normal.  Chest x-ray did not show any infiltrates.  Monitor fever trend.  Left hip pain Plain films do not show any acute findings.  No fracture identified on the CT scan of the pelvis either.  Since she continued to have significant symptoms with limited range of motion MRI was done  which shows a small joint effusion and also shows partial tear of the gluteus minimus with edema.   All these findings could be causing her symptoms.  Discussed with orthopedic surgeon Dr. Erlinda Hong who recommended arthrocentesis.  Discussed with patient.  She is agreeable this morning.  Discussed with interventional radiology.  We will send fluid for cell count, cultures and crystals.    Close fracture of the fifth metatarsal bone bilaterally The patient is supposed to follow-up with orthopedic surgeon, Dr. Sharol Given next week.  Her appointment is on 12/4.  Discussed with Dr. Sharol Given on 11/28 who recommends postop shoes bilaterally and weightbearing as tolerated.  PT and OT evaluation.    History of chronic diastolic CHF Initially patient was volume depleted and was given IV fluids.  Rate has been reduced.  Remains stable.  Chronic kidney disease stage III Renal function improved compared to last month.  Continue to monitor urine output.    Macrocytic anemia Drop in hemoglobin likely dilutional.  No evidence of overt bleeding.  Anemia panel from a month ago reviewed.  No clear-cut deficiencies identified.  TSH was 5.0 in October.  Hemoglobin stable.  History of acute PE and DVT diagnosed recently Continue Xarelto  History of depression  continue Remeron  History of pseudogout Colchicine as needed  Hyperthyroidism No longer on methimazole. TSH was 5.0 in October.  Outpatient monitoring.  Hyperbilirubinemia  She had right upper quadrant ultrasound last month which did not show any acute findings.  She is status post cholecystectomy.  Bilirubin noted to be normal on 9/29.  Severe protein calorie malnutrition Nutritional supplements   DVT Prophylaxis: On Xarelto    Code Status: Full code Family Communication: Discussed with the patient Disposition Plan: Management as outlined above.  Arthrocentesis of the left hip.       LOS: 4 days   [facility] Pager [phone] 01/18/2018, 8:50 AM  If 7PM-7AM, please contact night-coverage at [email or web address], password [facility]

## 2018-01-18 NOTE — Progress Notes (Signed)
Physical Therapy Treatment Patient Details Name: Ebony Scott MRN: [id number] DOB: 1950/05/03 Today's Date: 01/18/2018    History of Present Illness pt was admitted with nausea/vomiting/diarrhea.  She was discharged to SNF earlier this month after hospitalization at [facility] and  home from [facility] 01/08/18 with HHPT/OT and home instead (2x day).  PMH:  anxiety, depression, breast CA, chronic neck and low back pain, bil DVTs, fibromyalgia; xrays of ankles on this admission revealed bil 5th metatarsal fxs; MRI L hip (+) mild avascular necrosis, partial tear gluteus minimus tendon, tendinosis gluteus medius and hamstring    PT Comments    Max encouragement required for pt participation and progression of activity. Mod assist +2 for bed mobility on today. Attempted standing x 3 but pt was unable. Recommend SNF.    Follow Up Recommendations  SNF     Equipment Recommendations  None recommended by PT    Recommendations for Other Services       Precautions / Restrictions Precautions Precautions: Fall Restrictions Weight Bearing Restrictions: No RLE Weight Bearing: Weight bearing as tolerated LLE Weight Bearing: Weight bearing as tolerated Other Position/Activity Restrictions: with post op shoes    Mobility  Bed Mobility Overal bed mobility: Needs Assistance Bed Mobility: Rolling;Supine to Sit;Sit to Supine Rolling: Min assist   Supine to sit: Mod assist;+2 for physical assistance Sit to supine: Mod assist;+2 for physical assistance   General bed mobility comments: max encouragement required for participation. Assist for trunk and bil LEs. Utilized bedpad. Increased time.  Transfers Overall transfer level: Needs assistance Equipment used: Rolling walker (2 wheeled) Transfers: Sit to/from Stand;Lateral/Scoot Transfers          Lateral/Scoot Transfers: Mod assist;+2 physical assistance General transfer comment: Attempted x 3-pt unable. Limited by poor pt effort and pain. Pt peformed  lateral scoot towards HOB x 4  Ambulation/Gait                 Stairs             Wheelchair Mobility    Modified Rankin (Stroke Patients Only)       Balance Overall balance assessment: Needs assistance   Sitting balance-Leahy Scale: Fair     Standing balance support: Bilateral upper extremity supported Standing balance-Leahy Scale: Poor                              Cognition Arousal/Alertness: Awake/alert Behavior During Therapy: WFL for tasks assessed/performed Overall Cognitive Status: Within Functional Limits for tasks assessed                                 General Comments: pt was not very motivated this session      Exercises      General Comments        Pertinent Vitals/Pain Pain Assessment: Faces Faces Pain Scale: Hurts even more Pain Location: "all over" but especially L hip, bil feet Pain Descriptors / Indicators: Guarding;Grimacing;Discomfort Pain Intervention(s): Limited activity within patient's tolerance;Repositioned    Home Living                      Prior Function            PT Goals (current goals can now be found in the care plan section) Progress towards PT goals: Progressing toward goals    Frequency    Min 3X/week  PT Plan Current plan remains appropriate    Co-evaluation              AM-PAC PT "6 Clicks" Mobility   Outcome Measure  Help needed turning from your back to your side while in a flat bed without using bedrails?: A Lot Help needed moving from lying on your back to sitting on the side of a flat bed without using bedrails?: A Lot Help needed moving to and from a bed to a chair (including a wheelchair)?: A Lot Help needed standing up from a chair using your arms (e.g., wheelchair or bedside chair)?: Total Help needed to walk in hospital room?: Total Help needed climbing 3-5 steps with a railing? : Total 6 Click Score: 9    End of Session Equipment  Utilized During Treatment: Gait belt Activity Tolerance: Patient limited by pain Patient left: in bed;with call bell/phone within reach;with bed alarm set   PT Visit Diagnosis: Difficulty in walking, not elsewhere classified (R26.2);Pain;Other abnormalities of gait and mobility (R26.89);Muscle weakness (generalized) (M62.81) Pain - Right/Left: Left Pain - part of body: Hip;Ankle and joints of foot     Time: 8144-8185 PT Time Calculation (min) (ACUTE ONLY): 30 min  Charges:  $Therapeutic Activity: 23-37 mins                        Weston Anna, PT Acute Rehabilitation Services Pager: [phone] Office: [phone]

## 2018-01-18 NOTE — Procedures (Signed)
L hip aspiration 2 drops clear yellow fluid EBL 0 Comp 0

## 2018-01-19 LAB — CULTURE, BLOOD (ROUTINE X 2)
Culture: NO GROWTH
Culture: NO GROWTH

## 2018-01-19 MED ORDER — METHYLPREDNISOLONE SODIUM SUCC 40 MG IJ SOLR
40.0000 mg | Freq: Two times a day (BID) | INTRAMUSCULAR | Status: DC
  Administered 2018-01-19 – 2018-01-21 (×5): 40 mg via INTRAVENOUS
  Filled 2018-01-19 (×5): qty 1

## 2018-01-19 NOTE — Progress Notes (Signed)
[facility] PROGRESS NOTE  Ebony Scott XYB:[id number] DOB: 09-23-1950 DOA: 01/14/2018  PCP: [name], MD  Brief History/Interval Summary: 67 y.o. female with medical history significant of allergic rhinitis, seasonal allergies, asthma, anemia, anxiety, depression, osteoarthritis, pseudogout, breast cancer in remission, cataracts, chronic lower back pain, chronic neck pain, bilateral DVTs, diverticulosis, fibromyalgia, GERD, Graves' disease, hyperthyroidism, headache, history of hemorrhoids, IBS, osteoporosis, history of pneumonia who was recently admitted to [facility] acute PE and DVT.  She was started on anticoagulation.  Discharged to skilled nursing facility.  She did mention that she has been having diarrhea ever since that last hospitalization but presented with worsening GI symptoms with nausea vomiting and diarrhea.  She was found to be febrile.  C. difficile antigen was positive.  She was hospitalized for further management.  Patient was started on oral vancomycin.  She continues to have diarrhea.  Continues to have fever.  Reason for Visit: C. difficile.  Bilateral metatarsal fractures  Consultants: Phone discussion with Dr. Sharol Given with orthopedics  Procedures: Left hip arthrocentesis on 11/30  Antibiotics: Oral vancomycin 11/26  Subjective/Interval History: Patient had 4 bowel movements over the course of last 24 hours.  Liquidy BM this morning mixed with urine per nurse tech.  Abdominal pain has improved.  No nausea vomiting.  She continues to have left hip pain.  Cough is improved.  ROS: Denies any headaches  Objective:  Vital Signs  Vitals:   01/18/18 1331 01/18/18 2137 01/19/18 0615 01/19/18 1045  BP: 133/65 (!) 141/64 124/76   Pulse: 88 89 77   Resp: _0 Temp: 98.7 F (37.1 C) 99.3 F (37.4 C) 98.7 F (37.1 C)   TempSrc: Oral Oral Oral   SpO2: 100% 95% 100% 95%  Weight:      Height:        Intake/Output Summary (Last 24 hours) at 01/19/2018  1059 Last data filed at 01/19/2018 0914 Gross per 24 hour  Intake 1463.88 ml  Output 800 ml  Net 663.88 ml   Filed Weights   01/14/18 1632 01/15/18 0828  Weight: 60.4 kg 60.2 kg    General appearance: She is awake alert.  In no distress Resp: Normal effort at rest.  Clear to auscultation bilaterally. Cardio: S1-S2 is normal regular.  No S3-S4.  No rubs murmurs or bruit GI: Abdomen is soft.  Nontender now.  Bowel sounds present normal.  No masses organomegaly Extremities: Continues to have diminished range of motion of the left hip with pain on manipulation especially with internal and external rotation. Neurologic: Alert and oriented x3.  No focal neurological deficits.  Lab Results:  Data Reviewed: I have personally reviewed following labs and imaging studies  CBC: Recent Labs  Lab 01/14/18 1707 01/15/18 0902 01/16/18 0453 01/17/18 0501 01/18/18 0524  WBC 9.5 8.0 7.9 9.9 10.0  NEUTROABS 6.1 5.2  --   --   --   HGB 10.3* 8.8* 8.9* 9.3* 9.5*  HCT 33.3* 28.6* 29.0* 30.4* 30.5*  MCV 104.7* 103.6* 103.6* 104.8* 103.4*  PLT 284 247 256 274 660    Basic Metabolic Panel: Recent Labs  Lab 01/14/18 1707 01/15/18 0902 01/16/18 0453 01/17/18 0501  NA 138 139 138 138  K 3.5 4.1 4.2 4.5  CL 103 111 111 107  CO2 23 21* 19* 21*  GLUCOSE 108* 89 90 96  BUN _1 CREATININE 1.01* 0.87 0.93 0.85  CALCIUM 8.2* 7.4* 7.7* 8.5*    GFR: Estimated  Creatinine Clearance: 52.1 mL/min (by C-G formula based on SCr of 0.85 mg/dL).  Liver Function Tests: Recent Labs  Lab 01/14/18 1707 01/15/18 0902 01/17/18 0501  AST 69* 52* 42*  ALT 61* 51* 48*  ALKPHOS 70 62 64  BILITOT 1.8* 1.6* 0.8  PROT 7.3 5.8* 6.2*  ALBUMIN 3.3* 2.6* 2.5*     Recent Results (from the past 240 hour(s))  Blood Culture (routine x 2)     Status: None   Collection Time: 01/14/18  5:07 PM  Result Value Ref Range Status   Specimen Description   Final    LEFT ANTECUBITAL Blood Culture results may  not be optimal due to an excessive volume of blood received in culture bottles BOTTLES DRAWN AEROBIC ONLY Performed at [facility] [address] [place], [place] [address]    Special Requests   Final    NONE Performed at [facility], [place] [address], [place], [place] [address]    Culture   Final    NO GROWTH 5 DAYS Performed at [facility], [place] [address], [place], [place] [address]    Report Status 01/19/2018 FINAL  Final  Blood Culture (routine x 2)     Status: None   Collection Time: 01/14/18  5:17 PM  Result Value Ref Range Status   Specimen Description   Final    RIGHT ANTECUBITAL Blood Culture results may not be optimal due to an excessive volume of blood received in culture bottles BOTTLES DRAWN AEROBIC AND ANAEROBIC Performed at [facility] [address] [place], [place] [address]    Special Requests   Final    NONE Performed at [facility], [place] [address], [place], [place] [address]    Culture   Final    NO GROWTH 5 DAYS Performed at [facility], [place] [address] [place], [place] [address]    Report Status 01/19/2018 FINAL  Final  Urine culture     Status: Abnormal   Collection Time: 01/14/18  6:45 PM  Result Value Ref Range Status   Specimen Description   Final    URINE, CLEAN CATCH Performed at [facility], [place] [address] [place], [place] [address]    Special Requests   Final    NONE Performed at [facility], [place] [address] [place], [place] [address]    Culture MULTIPLE SPECIES PRESENT, SUGGEST RECOLLECTION (A)  Final   Report Status 01/16/2018 FINAL  Final  Gastrointestinal Panel by PCR , Stool     Status: None   Collection Time: 01/14/18  7:03 PM  Result Value Ref Range Status   Campylobacter species NOT DETECTED NOT DETECTED Final   Plesimonas shigelloides NOT DETECTED NOT DETECTED Final   Salmonella species NOT  DETECTED NOT DETECTED Final   Yersinia enterocolitica NOT DETECTED NOT DETECTED Final   Vibrio species NOT DETECTED NOT DETECTED Final   Vibrio cholerae NOT DETECTED NOT DETECTED Final   Enteroaggregative E coli (EAEC) NOT DETECTED NOT DETECTED Final   Enteropathogenic E coli (EPEC) NOT DETECTED NOT DETECTED Final   Enterotoxigenic E coli (ETEC) NOT DETECTED NOT DETECTED Final   Shiga like toxin producing E coli (STEC) NOT DETECTED NOT DETECTED Final   Shigella/Enteroinvasive E coli (EIEC) NOT DETECTED NOT DETECTED Final   Cryptosporidium NOT DETECTED NOT DETECTED Final   Cyclospora cayetanensis NOT DETECTED NOT DETECTED Final   Entamoeba histolytica NOT DETECTED NOT DETECTED Final  Giardia lamblia NOT DETECTED NOT DETECTED Final   Adenovirus F40/41 NOT DETECTED NOT DETECTED Final   Astrovirus NOT DETECTED NOT DETECTED Final   Norovirus GI/GII NOT DETECTED NOT DETECTED Final   Rotavirus A NOT DETECTED NOT DETECTED Final   Sapovirus (I, II, IV, and V) NOT DETECTED NOT DETECTED Final    Comment: Performed at [facility], [place] [place], [place] [address]  C difficile quick scan w PCR reflex     Status: Abnormal   Collection Time: 01/14/18  7:03 PM  Result Value Ref Range Status   C Diff antigen POSITIVE (A) NEGATIVE Final   C Diff toxin NEGATIVE NEGATIVE Final   C Diff interpretation Results are indeterminate. See PCR results.  Final    Comment: Performed at [facility], [place] [address], [place], [place] [address]  C. Diff by PCR, Reflexed     Status: Abnormal   Collection Time: 01/14/18  7:03 PM  Result Value Ref Range Status   Toxigenic C. Difficile by PCR POSITIVE (A) NEGATIVE Final    Comment: Positive for toxigenic C. difficile with little to no toxin production. Only treat if clinical presentation suggests symptomatic illness. Performed at [facility], [place] [address] [place], [place] [address]   Culture, blood (Routine X 2) w Reflex  to ID Panel     Status: None (Preliminary result)   Collection Time: 01/16/18  2:35 PM  Result Value Ref Range Status   Specimen Description   Final    BLOOD RIGHT ANTECUBITAL Performed at [facility] [address], [place], [place] [address]    Special Requests   Final    BOTTLES DRAWN AEROBIC AND ANAEROBIC Blood Culture adequate volume Performed at [facility] [address] [place], [place] [address]    Culture   Final    NO GROWTH 3 DAYS Performed at [facility], [place] [address] [place], [place] [address]    Report Status PENDING  Incomplete  Culture, blood (Routine X 2) w Reflex to ID Panel     Status: None (Preliminary result)   Collection Time: 01/16/18  2:40 PM  Result Value Ref Range Status   Specimen Description   Final    BLOOD RIGHT ARM Performed at [facility] [address] [place], [place] [address]    Special Requests   Final    BOTTLES DRAWN AEROBIC AND ANAEROBIC Blood Culture adequate volume Performed at [facility] [address] [place], [place] [address]    Culture   Final    NO GROWTH 3 DAYS Performed at [facility], [place] [address], [place], [place] [address]    Report Status PENDING  Incomplete  Body fluid culture     Status: None (Preliminary result)   Collection Time: 01/18/18  8:50 AM  Result Value Ref Range Status   Specimen Description   Final    HIP LEFT Performed at [facility] [address] [place], [place] [address]    Special Requests   Final    LESS THAN ONE DROP OF FLUID GRAM AND CHOC PLATE ONLY   Gram Stain   Final    RARE WBC PRESENT, PREDOMINANTLY PMN NO ORGANISMS SEEN    Culture   Final    NO GROWTH < 24 HOURS INTERPRET RESULTS WITH CAUTION DUE TO LIMITED SPECIMEN VOLUME Performed at [facility], [place] [address]  [place], [place] [address]    Report Status PENDING  Incomplete      Radiology  Studies: Dg Arthro Hip Left  Result Date: 01/18/2018 INDICATION: Left hip joint effusion.  Fever.  Chills. EXAM: ARTHROCENTESIS OF LEFT HIP JOINT UNDER FLUOROSCOPIC GUIDANCE COMPARISON:  None. FLUOROSCOPY TIME:  Fluoroscopy Time:  12 seconds Radiation Exposure Index (if provided by the fluoroscopic device): 1 mGy Number of Acquired Spot Images: 0 COMPLICATIONS: None immediate. PROCEDURE: Informed written consent was obtained from the patient after discussion of the risks, benefits and alternatives to treatment. The patient was placed prone on the fluoroscopy table and the left extremity was placed in a slight degree of flexion and internal rotation. The left hip was localized with fluoroscopy. The skin overlying the anterior aspect of the hip was prepped and draped in usual sterile fashion. A 22 gauge spinal needle was advanced into the hip joint at the lateral aspect of the femoral head-neck junction, after the overlying soft tissues were anesthetized with 1% lidocaine. A fluoroscopic image was saved and sent to PACs. Two drops of clear yellow fluid was aspirated from the left hip joint. The aspirated fluid was sent for laboratory evaluation ordered by referring physician. The needle was removed and a dressing was placed. FINDINGS: The image documents needle placement in the left hip joint. IMPRESSION: Successful fluoroscopic guided aspiration of the left hip. Electronically Signed   By: Marybelle Killings M.D.   On: 01/18/2018 10:31   Mr Hip Left Wo Contrast  Result Date: 01/17/2018 CLINICAL DATA:  Left hip pain.  No trauma. EXAM: MR OF THE LEFT HIP WITHOUT CONTRAST TECHNIQUE: Multiplanar, multisequence MR imaging was performed. No intravenous contrast was administered. COMPARISON:  Left hip x-rays dated January 15, 2018. FINDINGS: Bones: Greater than right serpiginous signal changes within the left femoral heads, consistent with avascular necrosis. No significant surrounding marrow edema. No fracture or  dislocation. No focal bone lesion. The visualized sacroiliac joints and symphysis pubis appear normal. Articular cartilage and labrum Articular cartilage: No focal chondral defect or subchondral signal abnormality identified. Labrum: There is no gross labral tear or paralabral abnormality. Joint or bursal effusion Joint effusion: Small left hip joint effusion. No right hip joint effusion. Bursae: No focal periarticular fluid collection. Muscles and tendons Muscles and tendons: Partial tear of the left gluteus minimus tendon. Mild bilateral gluteus medius tendinosis. Moderate bilateral hamstring tendinosis. The iliopsoas tendons are unremarkable. Prominent edema within the left gluteus minimus muscle. Mild edema within the bilateral adductor musculature and inferior left gluteus medius muscle. No muscle atrophy. Other findings Miscellaneous: The visualized internal pelvic contents appear unremarkable. IMPRESSION: 1. 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 2. Small left hip joint effusion, nonspecific. 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 If there is clinical concern for septic arthritis, arthrocentesis can be performed. 3. Partial tear of the left gluteus minimus tendon with significant muscle strain. 4. Mild bilateral gluteus medius and moderate bilateral hamstring tendinosis. Electronically Signed   By: Titus Dubin M.D.   On: 01/17/2018 13:19     Medications:  Scheduled: . calcium carbonate  1 tablet Oral BID WC  . cholecalciferol  1,000 Units Oral Daily  . feeding supplement (GLUCERNA SHAKE)  237 mL Oral TID BM  . feeding supplement (PRO-STAT SUGAR FREE 64)  30 mL Oral TID WC  . ferrous sulfate  325 mg Oral Q breakfast  .  gabapentin  300 mg Oral BID  . ibuprofen  600 mg Oral TID AC  . lactase  6,000 Units Oral TID WC  .  lipase/protease/amylase  24,000 Units Oral TID WC  . mirtazapine  30 mg Oral QHS  . mometasone-formoterol  1 puff Inhalation BID  . montelukast  10 mg Oral Daily  . nystatin  5 mL Mouth/Throat QHS  . rivaroxaban  20 mg Oral Q supper  . saccharomyces boulardii  250 mg Oral BID  . umeclidinium bromide  1 puff Inhalation Daily  . vancomycin  125 mg Oral QID   Continuous: . 0.9 % NaCl with KCl 20 mEq / L 50 mL/hr at 01/19/18 0600   YIR:[id number] **OR** acetaminophen, LORazepam, ondansetron **OR** ondansetron (ZOFRAN) IV, simethicone, sodium chloride, sorbitol, traMADol    Assessment/Plan:  C. difficile colitis/persistent fever Patient presented with nausea vomiting and profuse diarrhea.  Stool studies were done.  C. difficile antigen was positive.  Toxin negative.  PCR positive.  GI pathogen panel unremarkable.  Since patient had significant abdominal tenderness a CT scan was done.  This did not show any acute findings.  Patient's diarrhea is down to about 4 BMs per day.  Not large quantity of stool.  Abdomen is benign.  Fever appears to have subsided.  Chest x-ray did not show any infiltrates.  Cough is better.  Continue to monitor.   Left hip pain Plain films do not show any acute findings.  No fracture identified on the CT scan of the pelvis either.  Since she continued to have significant symptoms with limited range of motion MRI was done which shows a small joint effusion and also shows partial tear of the gluteus minimus with edema.  All these findings could be causing her symptoms.  Discussed with orthopedic surgeon Dr. Erlinda Hong who recommended arthrocentesis.  Patient underwent arthrocentesis yesterday however only a minimal amount of clear yellow fluid was aspirated.  It was not enough for cell counts or crystal examination.  It was sent for cultures which is negative so far.  Patient's ESR noted to be significantly elevated at 138.  CRP 15.8.  All this could be suggestive of severe  inflammation.  Patient with known history of pseudogout.  Since fevers have subsided and this appears to be an inflammatory process rather than a septic joint, we will initiate the patient on steroids.  This was discussed with her and she is agreeable.    Close fracture of the fifth metatarsal bone bilaterally The patient is supposed to follow-up with orthopedic surgeon, Dr. Sharol Given next week.  Her appointment is on 12/4.  Discussed with Dr. Sharol Given on 11/28 who recommends postop shoes bilaterally and weightbearing as tolerated.  PT and OT evaluation.    History of chronic diastolic CHF Initially patient was volume depleted and was given IV fluids.  Okay to stop IV fluids.  Chronic kidney disease stage III Renal function improved compared to last month.  Continue to monitor urine output.    Macrocytic anemia Drop in hemoglobin likely dilutional.  No evidence of overt bleeding.  Anemia panel from a month ago reviewed.  No clear-cut deficiencies identified.  TSH was 5.0 in October.  Hemoglobin stable.  History of acute PE and DVT diagnosed recently Continue Xarelto  History of depression  continue Remeron  History of pseudogout See above.  Hyperthyroidism No longer on methimazole. TSH was 5.0 in October.  Outpatient monitoring.  Hyperbilirubinemia  She had right upper quadrant ultrasound last month which did  not show any acute findings.  She is status post cholecystectomy.  Bilirubin noted to be normal on 9/29.  Severe protein calorie malnutrition Nutritional supplements   DVT Prophylaxis: On Xarelto    Code Status: Full code Family Communication: Discussed with the patient Disposition Plan: Management as outlined above.  Steroids to be initiated today for her severe left hip pain.    LOS: 5 days   [facility] Pager [phone] 01/19/2018, 10:59 AM  If 7PM-7AM, please contact night-coverage at [email or web address], password [facility]

## 2018-01-20 LAB — COMPREHENSIVE METABOLIC PANEL
ALT: 100 U/L — ABNORMAL HIGH (ref 0–44)
AST: 116 U/L — ABNORMAL HIGH (ref 15–41)
Albumin: 2.3 g/dL — ABNORMAL LOW (ref 3.5–5.0)
Alkaline Phosphatase: 87 U/L (ref 38–126)
Anion gap: 12 (ref 5–15)
BUN: 28 mg/dL — ABNORMAL HIGH (ref 8–23)
CO2: 20 mmol/L — ABNORMAL LOW (ref 22–32)
Calcium: 9.7 mg/dL (ref 8.9–10.3)
Chloride: 109 mmol/L (ref 98–111)
Creatinine, Ser: 0.81 mg/dL (ref 0.44–1.00)
GFR calc Af Amer: >60 mL/min (ref >60)
GFR calc non Af Amer: >60 mL/min (ref >60)
Glucose, Bld: 159 mg/dL — ABNORMAL HIGH (ref 70–99)
Potassium: 4.2 mmol/L (ref 3.5–5.1)
Sodium: 141 mmol/L (ref 135–145)
Total Bilirubin: 0.4 mg/dL (ref 0.3–1.2)
Total Protein: 6.7 g/dL (ref 6.5–8.1)

## 2018-01-20 LAB — CBC
HCT: 31.6 % — ABNORMAL LOW (ref 36.0–46.0)
Hemoglobin: 9.8 g/dL — ABNORMAL LOW (ref 12.0–15.0)
MCH: 31.2 pg (ref 26.0–34.0)
MCHC: 31 g/dL (ref 30.0–36.0)
MCV: 100.6 fL — ABNORMAL HIGH (ref 80.0–100.0)
Platelets: 411 10*3/uL — ABNORMAL HIGH (ref 150–400)
RBC: 3.14 MIL/uL — ABNORMAL LOW (ref 3.87–5.11)
RDW: 14.7 % (ref 11.5–15.5)
WBC: 7.6 10*3/uL (ref 4.0–10.5)
nRBC: 0 % (ref 0.0–0.2)

## 2018-01-20 NOTE — Progress Notes (Addendum)
[facility] PROGRESS NOTE  Ebony Scott IOX:[id number] DOB: [birth year] DOA: 01/14/2018  PCP: [name], MD  Brief History/Interval Summary: 67 y.o. female with medical history significant of allergic rhinitis, seasonal allergies, asthma, anemia, anxiety, depression, osteoarthritis, pseudogout, breast cancer in remission, cataracts, chronic lower back pain, chronic neck pain, bilateral DVTs, diverticulosis, fibromyalgia, GERD, Graves' disease, hyperthyroidism, headache, history of hemorrhoids, IBS, osteoporosis, history of pneumonia who was recently admitted to [facility] acute PE and DVT.  She was started on anticoagulation and was discharged to skilled nursing facility.  She did mention that she has been having diarrhea ever since that last hospitalization but presented with worsening GI symptoms with nausea vomiting and diarrhea.  She was found to be febrile.  C. difficile antigen was positive.  She was hospitalized for further management.  Patient was started on oral vancomycin.  She continued to have fever and diarrhea which appears to have improved over the last 2 days.  Then she also mentioned significant left hip pain which is thought to be due to pseudogout.  Started on steroids.  Reason for Visit: C. difficile.  Bilateral metatarsal fractures.  Left hip pain  Consultants: Phone discussion with Dr. Sharol Given with orthopedics.  Question also with Dr. Erlinda Hong with [facility] orthopedics  Procedures: Left hip arthrocentesis on 11/30  Antibiotics: Oral vancomycin 11/26  Subjective/Interval History: Patient had 4 bowel movements over the last 24 hours.  Not large quantity but still remain liquid.  No abdominal pain.  No nausea vomiting.  She feels that the left hip pain is improving slightly.  Cough persists but better.  ROS: Denies any headaches  Objective:  Vital Signs  Vitals:   01/19/18 2044 01/19/18 2150 01/20/18 0444 01/20/18 0814  BP:  104/79 (!) 150/76   Pulse:  88 80   Resp:   18 18   Temp:  98.6 F (37 C) (!) 97.4 F (36.3 C)   TempSrc:  Oral Oral   SpO2: 95% 95% 98% 97%  Weight:      Height:        Intake/Output Summary (Last 24 hours) at 01/20/2018 1152 Last data filed at 01/20/2018 2683 Gross per 24 hour  Intake 720 ml  Output 1200 ml  Net -480 ml   Filed Weights   01/14/18 1632 01/15/18 0828  Weight: 60.4 kg 60.2 kg    General appearance: She is awake alert.  In no distress Resp: Clear to auscultation bilaterally.  Perhaps diminished in the bases. Cardio: S1-S2 is normal regular.  No S3-S4.  No rubs murmurs or bruit GI: Abdomen is soft.  Nontender nondistended.  Bowel sounds are present normal.  No masses organomegaly Extremities: Noted to have slightly improved mobility of the left lower extremity.  She was able to flex her hip on her own.  Less tender.   Neurologic: Alert and oriented x3.  No focal neurological deficits.  Lab Results:  Data Reviewed: I have personally reviewed following labs and imaging studies  CBC: Recent Labs  Lab 01/14/18 1707 01/15/18 0902 01/16/18 0453 01/17/18 0501 01/18/18 0524 01/20/18 0406  WBC 9.5 8.0 7.9 9.9 10.0 7.6  NEUTROABS 6.1 5.2  --   --   --   --   HGB 10.3* 8.8* 8.9* 9.3* 9.5* 9.8*  HCT 33.3* 28.6* 29.0* 30.4* 30.5* 31.6*  MCV 104.7* 103.6* 103.6* 104.8* 103.4* 100.6*  PLT 284 247 256 274 314 411*    Basic Metabolic Panel: Recent Labs  Lab 01/14/18 1707 01/15/18 0902 01/16/18  0453 01/17/18 0501 01/20/18 0406  NA 138 139 138 138 141  K 3.5 4.1 4.2 4.5 4.2  CL 103 111 111 107 109  CO2 23 21* 19* 21* 20*  GLUCOSE 108* 89 90 96 159*  BUN '12 10 10 13 '$ 28*  CREATININE 1.01* 0.87 0.93 0.85 0.81  CALCIUM 8.2* 7.4* 7.7* 8.5* 9.7    GFR: Estimated Creatinine Clearance: 54.6 mL/min (by C-G formula based on SCr of 0.81 mg/dL).  Liver Function Tests: Recent Labs  Lab 01/14/18 1707 01/15/18 0902 01/17/18 0501 01/20/18 0406  AST 69* 52* 42* 116*  ALT 61* 51* 48* 100*  ALKPHOS 70 62  64 87  BILITOT 1.8* 1.6* 0.8 0.4  PROT 7.3 5.8* 6.2* 6.7  ALBUMIN 3.3* 2.6* 2.5* 2.3*     Recent Results (from the past 240 hour(s))  Blood Culture (routine x 2)     Status: None   Collection Time: 01/14/18  5:07 PM  Result Value Ref Range Status   Specimen Description   Final    LEFT ANTECUBITAL Blood Culture results may not be optimal due to an excessive volume of blood received in culture bottles BOTTLES DRAWN AEROBIC ONLY Performed at [place] [address], [place], [place] [address]    Special Requests   Final    NONE Performed at [facility], [place] [address], [place], [place] [address]    Culture   Final    NO GROWTH 5 DAYS Performed at [facility], [place] [address] [place], [place] [address]    Report Status 01/19/2018 FINAL  Final  Blood Culture (routine x 2)     Status: None   Collection Time: 01/14/18  5:17 PM  Result Value Ref Range Status   Specimen Description   Final    RIGHT ANTECUBITAL Blood Culture results may not be optimal due to an excessive volume of blood received in culture bottles BOTTLES DRAWN AEROBIC AND ANAEROBIC Performed at [place] [address], [place], [place] [address]    Special Requests   Final    NONE Performed at [facility], [place] [address] [place], [place] [address]    Culture   Final    NO GROWTH 5 DAYS Performed at [facility], [place] [address] [place], [place] [address]    Report Status 01/19/2018 FINAL  Final  Urine culture     Status: Abnormal   Collection Time: 01/14/18  6:45 PM  Result Value Ref Range Status   Specimen Description   Final    URINE, CLEAN CATCH Performed at [facility], [place] [address], [place], [place] [address]    Special Requests   Final    NONE Performed at [facility], [place] [address] [place],  [address]    Culture MULTIPLE SPECIES PRESENT, SUGGEST  RECOLLECTION (A)  Final   Report Status 01/16/2018 FINAL  Final  Gastrointestinal Panel by PCR , Stool     Status: None   Collection Time: 01/14/18  7:03 PM  Result Value Ref Range Status   Campylobacter species NOT DETECTED NOT DETECTED Final   Plesimonas shigelloides NOT DETECTED NOT DETECTED Final   Salmonella species NOT DETECTED NOT DETECTED Final   Yersinia enterocolitica NOT DETECTED NOT DETECTED Final   Vibrio species NOT DETECTED NOT DETECTED Final   Vibrio cholerae NOT DETECTED NOT DETECTED Final   Enteroaggregative E coli (EAEC) NOT DETECTED NOT DETECTED Final  Enteropathogenic E coli (EPEC) NOT DETECTED NOT DETECTED Final   Enterotoxigenic E coli (ETEC) NOT DETECTED NOT DETECTED Final   Shiga like toxin producing E coli (STEC) NOT DETECTED NOT DETECTED Final   Shigella/Enteroinvasive E coli (EIEC) NOT DETECTED NOT DETECTED Final   Cryptosporidium NOT DETECTED NOT DETECTED Final   Cyclospora cayetanensis NOT DETECTED NOT DETECTED Final   Entamoeba histolytica NOT DETECTED NOT DETECTED Final   Giardia lamblia NOT DETECTED NOT DETECTED Final   Adenovirus F40/41 NOT DETECTED NOT DETECTED Final   Astrovirus NOT DETECTED NOT DETECTED Final   Norovirus GI/GII NOT DETECTED NOT DETECTED Final   Rotavirus A NOT DETECTED NOT DETECTED Final   Sapovirus (I, II, IV, and V) NOT DETECTED NOT DETECTED Final    Comment: Performed at [facility], [place] [place], [place] [address]  C difficile quick scan w PCR reflex     Status: Abnormal   Collection Time: 01/14/18  7:03 PM  Result Value Ref Range Status   C Diff antigen POSITIVE (A) NEGATIVE Final   C Diff toxin NEGATIVE NEGATIVE Final   C Diff interpretation Results are indeterminate. See PCR results.  Final    Comment: Performed at [facility], [place] [address] [place], [place] [address]  C. Diff by PCR, Reflexed     Status: Abnormal   Collection Time: 01/14/18  7:03 PM  Result Value Ref Range  Status   Toxigenic C. Difficile by PCR POSITIVE (A) NEGATIVE Final    Comment: Positive for toxigenic C. difficile with little to no toxin production. Only treat if clinical presentation suggests symptomatic illness. Performed at [facility], [place] [address], [place], [place] [address]   Culture, blood (Routine X 2) w Reflex to ID Panel     Status: None (Preliminary result)   Collection Time: 01/16/18  2:35 PM  Result Value Ref Range Status   Specimen Description   Final    BLOOD RIGHT ANTECUBITAL Performed at [place] [address] [place], [place] [address]    Special Requests   Final    BOTTLES DRAWN AEROBIC AND ANAEROBIC Blood Culture adequate volume Performed at [place] [address], [place], [place] [address]    Culture   Final    NO GROWTH 4 DAYS Performed at [facility], [place] [address] [place], [place] [address]    Report Status PENDING  Incomplete  Culture, blood (Routine X 2) w Reflex to ID Panel     Status: None (Preliminary result)   Collection Time: 01/16/18  2:40 PM  Result Value Ref Range Status   Specimen Description   Final    BLOOD RIGHT ARM Performed at [facility] [address] [place], [place] [address]    Special Requests   Final    BOTTLES DRAWN AEROBIC AND ANAEROBIC Blood Culture adequate volume Performed at [place] [address], [place], [place] [address]    Culture   Final    NO GROWTH 4 DAYS Performed at [facility], [place] [address] [place], [place] [address]    Report Status PENDING  Incomplete  Body fluid culture     Status: None (Preliminary result)   Collection Time: 01/18/18  8:50 AM  Result Value Ref Range Status   Specimen Description   Final    HIP LEFT Performed at [place] [address], [place], [place] [address]  Special Requests   Final    LESS THAN ONE DROP OF FLUID GRAM AND CHOC  PLATE ONLY   Gram Stain   Final    RARE WBC PRESENT, PREDOMINANTLY PMN NO ORGANISMS SEEN    Culture   Final    NO GROWTH 2 DAYS INTERPRET RESULTS WITH CAUTION DUE TO LIMITED SPECIMEN VOLUME Performed at [facility], [place] [address] [place], [place] [address]    Report Status PENDING  Incomplete      Radiology Studies: No results found.   Medications:  Scheduled: . calcium carbonate  1 tablet Oral BID WC  . cholecalciferol  1,000 Units Oral Daily  . feeding supplement (GLUCERNA SHAKE)  237 mL Oral TID BM  . feeding supplement (PRO-STAT SUGAR FREE 64)  30 mL Oral TID WC  . ferrous sulfate  325 mg Oral Q breakfast  . gabapentin  300 mg Oral BID  . lactase  6,000 Units Oral TID WC  . lipase/protease/amylase  24,000 Units Oral TID WC  . methylPREDNISolone (SOLU-MEDROL) injection  40 mg Intravenous Q12H  . mirtazapine  30 mg Oral QHS  . mometasone-formoterol  1 puff Inhalation BID  . montelukast  10 mg Oral Daily  . nystatin  5 mL Mouth/Throat QHS  . rivaroxaban  20 mg Oral Q supper  . saccharomyces boulardii  250 mg Oral BID  . umeclidinium bromide  1 puff Inhalation Daily  . vancomycin  125 mg Oral QID   Continuous:  IPJ:[id number] **OR** acetaminophen, LORazepam, ondansetron **OR** ondansetron (ZOFRAN) IV, simethicone, sodium chloride, sorbitol, traMADol    Assessment/Plan:  C. difficile colitis/persistent fever Patient presented with nausea vomiting and profuse diarrhea.  Stool studies were done.  C. difficile antigen was positive.  Toxin negative.  PCR positive.  GI pathogen panel unremarkable.  Since patient had significant abdominal tenderness a CT scan was done.  This did not show any acute findings.  Patient's fever has resolved.  Diarrhea is down to about 4 bowel movements per day.  Cough is better.  Continue with incentive spirometry.  Chest x-ray did not show any acute findings.    Left hip pain most likely due to pseudogout Plain films do not show any  acute findings.  No fracture identified on the CT scan of the pelvis either.  Since she continued to have significant symptoms with limited range of motion MRI was done which shows a small joint effusion and also shows partial tear of the gluteus minimus with edema.  All these findings could be causing her symptoms.  Discussed with orthopedic surgeon Dr. Erlinda Hong who recommended arthrocentesis.  Patient underwent arthrocentesis on 11/30 however only a minimal amount of clear yellow fluid was aspirated.  It was not enough for cell counts or crystal examination.  It was sent for cultures which is negative so far.  Patient's ESR noted to be significantly elevated at 138.  CRP 15.8.  All this could be suggestive of severe inflammation.  Patient with known history of pseudogout.  Since fevers have subsided and this appears to be an inflammatory process rather than a septic joint, patient was started on a trial of steroids.  Appears to be helping her symptoms.  Continue intravenous steroids for another 24 hours.  PT and OT to reevaluate.  We will recheck her ESR and CRP in the next 24 to 48 hours.    Close fracture of the fifth metatarsal bone bilaterally The patient is supposed to follow-up with orthopedic surgeon, Dr. Sharol Given  next week.  Her appointment is on 12/4.  She probably will not make this appointment.  This will need to be rescheduled. Discussed with Dr. Sharol Given on 11/28 who recommends postop shoes bilaterally and weightbearing as tolerated.    History of chronic diastolic CHF Initially patient was volume depleted and was given IV fluids.  IV fluids discontinued.  Patient has good oral intake.  Chronic kidney disease stage III Renal function improved compared to last month.  Continue to monitor urine output.    Macrocytic anemia Drop in hemoglobin likely dilutional.  No evidence of overt bleeding.  Anemia panel from a month ago reviewed.  No clear-cut deficiencies identified.  TSH was 5.0 in October.   Hemoglobin stable.  History of acute PE and DVT diagnosed recently Continue Xarelto  History of depression  continue Remeron  History of pseudogout See above.  Hyperthyroidism No longer on methimazole. TSH was 5.0 in October.  Outpatient monitoring.  Abnormal LFTs Old labs reviewed.  Patient's AST and ALT have always been elevated.  Reason for this is not entirely clear.  She had right upper quadrant ultrasound last month which did not show any acute findings.  She is status post cholecystectomy.  Has not had a hepatitis panel which will be checked.  HCV antibody was negative in 2017.  LFTs stable when compared to levels from 3 months ago.  Severe protein calorie malnutrition Nutritional supplements   DVT Prophylaxis: On Xarelto    Code Status: Full code Family Communication: Discussed with the patient Disposition Plan: Management as outlined above.  Continue IV steroid.  Will be interested to see how she does with PT and OT now that her hip pain appears to be improving.    LOS: 6 days   [facility] Pager [phone] 01/20/2018, 11:52 AM  If 7PM-7AM, please contact night-coverage at [email or web address], password [facility]

## 2018-01-20 NOTE — Care Management Important Message (Signed)
Important Message  Patient Details  Name: PATRYCE DEPRIEST MRN: [id number] Date of Birth: [birth year]   Medicare Important Message Given:  Yes    Kerin Salen 01/20/2018, 10:23 AMImportant Message  Patient Details  Name: JENNAMARIE GOINGS MRN: [id number] Date of Birth: 10/19/1950   Medicare Important Message Given:  Yes    Kerin Salen 01/20/2018, 10:23 AM

## 2018-01-20 NOTE — Progress Notes (Signed)
Physical Therapy Treatment Patient Details Name: Ebony Scott MRN: [id number] DOB: 02/14/1951 Today's Date: 01/20/2018     History of Present Illness pt was admitted with nausea/vomiting/diarrhea.  She was discharged to SNF earlier this month after hospitalization at [facility] and  home from [facility] 01/08/18 with HHPT/OT and home instead (2x day).  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  used B platform EVA walker to do weakness and pt's decreased confidence associated with fear of falling/B LE buckling.  Also required + 2 assist such that recliner was following.  Next visit will use a RW.     Follow Up Recommendations  SNF(pt wants to D/C to home due to financial) in which case witll need 24/7 assist     Equipment Recommendations  None recommended by PT    Recommendations for Other Services       Precautions / Restrictions Precautions Precautions: Fall Precaution Comments: L hip AVN and B 5th metetarsal Fx's  Restrictions Weight Bearing Restrictions: No    Mobility  Bed Mobility Overal bed mobility: Needs Assistance       Supine to sit: Min assist;Mod assist     General bed mobility comments: assist upper body and complete scooting to EOB due to weakness   Transfers Overall transfer level: Needs assistance Equipment used: None Transfers: Sit to/from Stand Sit to Stand: Mod assist;+2 physical assistance Stand pivot transfers: Mod assist;+2 physical assistance;+2 safety/equipment;Max assist       General transfer comment: + 2 assist with 755 VC's on proper hand placement and safety with turns.    Ambulation/Gait Ambulation/Gait assistance: Mod assist;+2 physical  assistance;+2 safety/equipment Gait Distance (Feet): 85 Feet Assistive device: Bilateral platform walker(EVA walker ) Gait Pattern/deviations: Step-to pattern;Step-through pattern;Decreased step length - right;Decreased step length - left;Drifts right/left;Trunk flexed Gait velocity: decreased    General Gait Details: used B platform EVA walker to do weakness and pt's decreased confidence associated with fear of falling/B LE buckling.  Also required + 2 assist such that recliner was following.    Stairs             Wheelchair Mobility    Modified Rankin (Stroke Patients Only)       Balance                                            Cognition Arousal/Alertness: Awake/alert Behavior During Therapy: WFL for tasks assessed/performed Overall Cognitive Status: Within Functional Limits for tasks assessed                                 General Comments: pt apprehensive about walking       Exercises      General Comments        Pertinent Vitals/Pain Pain Assessment: No/denies pain Pain Location: "the Steriods are really helping" decrease pain    Home Living                      Prior Function  PT Goals (current goals can now be found in the care plan section) Progress towards PT goals: Progressing toward goals    Frequency           PT Plan Current plan remains appropriate    Co-evaluation              AM-PAC PT "6 Clicks" Mobility   Outcome Measure  Help needed turning from your back to your side while in a flat bed without using bedrails?: A Lot Help needed moving from lying on your back to sitting on the side of a flat bed without using bedrails?: A Lot Help needed moving to and from a bed to a chair (including a wheelchair)?: A Lot Help needed standing up from a chair using your arms (e.g., wheelchair or bedside chair)?: A Lot Help needed to walk in hospital room?: Total Help needed  climbing 3-5 steps with a railing? : Total 6 Click Score: 10    End of Session Equipment Utilized During Treatment: Gait belt Activity Tolerance: Patient tolerated treatment well Patient left: in chair;with call bell/phone within reach Nurse Communication: Mobility status PT Visit Diagnosis: Difficulty in walking, not elsewhere classified (R26.2);Pain;Other abnormalities of gait and mobility (R26.89);Muscle weakness (generalized) (M62.81)     Time: 1435-1500 PT Time Calculation (min) (ACUTE ONLY): 25 min  Charges:  $Gait Training: 8-22 mins $Therapeutic Activity: 8-22 mins                     Rica Koyanagi  PTA Acute  Rehabilitation Services Pager      [phone] Office      [phone]

## 2018-01-21 LAB — COMPREHENSIVE METABOLIC PANEL
ALT: 305 U/L — ABNORMAL HIGH (ref 0–44)
AST: 379 U/L — ABNORMAL HIGH (ref 15–41)
Albumin: 2.3 g/dL — ABNORMAL LOW (ref 3.5–5.0)
Alkaline Phosphatase: 92 U/L (ref 38–126)
Anion gap: 9 (ref 5–15)
BUN: 31 mg/dL — ABNORMAL HIGH (ref 8–23)
CO2: 24 mmol/L (ref 22–32)
Calcium: 9.2 mg/dL (ref 8.9–10.3)
Chloride: 109 mmol/L (ref 98–111)
Creatinine, Ser: 0.84 mg/dL (ref 0.44–1.00)
GFR calc Af Amer: >60 mL/min (ref >60)
GFR calc non Af Amer: >60 mL/min (ref >60)
Glucose, Bld: 159 mg/dL — ABNORMAL HIGH (ref 70–99)
Potassium: 4.2 mmol/L (ref 3.5–5.1)
Sodium: 142 mmol/L (ref 135–145)
Total Bilirubin: 0.3 mg/dL (ref 0.3–1.2)
Total Protein: 6 g/dL — ABNORMAL LOW (ref 6.5–8.1)

## 2018-01-21 LAB — CULTURE, BLOOD (ROUTINE X 2)
Culture: NO GROWTH
Culture: NO GROWTH
Special Requests: ADEQUATE
Special Requests: ADEQUATE

## 2018-01-21 MED ORDER — METHYLPREDNISOLONE SODIUM SUCC 40 MG IJ SOLR
40.0000 mg | Freq: Every day | INTRAMUSCULAR | Status: DC
  Administered 2018-01-22: 40 mg via INTRAVENOUS
  Filled 2018-01-21: qty 1

## 2018-01-21 MED ORDER — ALUM & MAG HYDROXIDE-SIMETH 200-200-20 MG/5ML PO SUSP
15.0000 mL | ORAL | Status: DC | PRN
  Administered 2018-01-21 (×2): 15 mL via ORAL
  Filled 2018-01-21 (×2): qty 30

## 2018-01-21 NOTE — Progress Notes (Signed)
Occupational Therapy Treatment Patient Details Name: Ebony Scott MRN: [id number] DOB: 15-Aug-1950 Today's Date: 01/21/2018    History of present illness pt was admitted with nausea/vomiting/diarrhea.  She was discharged to SNF earlier this month after hospitalization at [facility] and  home from [facility] 01/08/18 with HHPT/OT and home instead (2x day).  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  Min A for transfer and mod A for hygiene.  Pt was steadier with 2nd transfer   Follow Up Recommendations  Home health OT(needs assist for adls and mobility; doesn't want snf)    Equipment Recommendations  None recommended by OT    Recommendations for Other Services      Precautions / Restrictions Precautions Precautions: Fall Precaution Comments: L hip AVN and B 5th metetarsal Fx's  Required Braces or Orthoses: (bil post op shoes) Restrictions Weight Bearing Restrictions: No RLE Weight Bearing: Weight bearing as tolerated LLE Weight Bearing: Weight bearing as tolerated Other Position/Activity Restrictions: WBAT with post op shoes       Mobility Bed Mobility         Supine to sit: Min guard;HOB elevated(HOB raised)        Transfers   Equipment used: Rolling walker (2 wheeled)   Sit to Stand: Min assist Stand pivot transfers: Min assist       General transfer comment: Cues for UE placement and to push weight through arms when moving LLE as she did not wear post op shoe on R    Balance     Sitting balance-Leahy Scale: Fair                                     ADL either performed or assessed with clinical judgement   ADL       Grooming: Set up;Sitting;Wash/dry hands                    Toilet Transfer: Minimal assistance;Stand-pivot;BSC;RW   Toileting- Clothing Manipulation and Hygiene: Moderate assistance;Sit to/from stand         General ADL Comments: pt states that she had 2 different post op shoes delivered and that first ones have disappeared.  These were black and more supportive than current 2 blue post op shoes.  She reports she ambulated in socks yesterday and cannot wear current post op shoes as it is too unsafe to wear both. She has a more supportive post op shoe from urgent care, and she was agreeable to using this on L foot as this foot is more sore than R     Vision       Perception     Praxis      Cognition Arousal/Alertness: Awake/alert Behavior During Therapy: WFL for tasks assessed/performed Overall Cognitive Status: Within Functional Limits for tasks assessed                                          Exercises General Exercises - Upper Extremity Shoulder Flexion: (pt independent with bil level 1 theraband HEP)   Shoulder Instructions  General Comments      Pertinent Vitals/ Pain       Pain Assessment: Faces Faces Pain Scale: Hurts a little bit Pain Location: L foot Pain Descriptors / Indicators: Sore Pain Intervention(s): Limited activity within patient's tolerance;Monitored during session;Premedicated before session;Repositioned  Home Living                                          Prior Functioning/Environment              Frequency  Min 2X/week        Progress Toward Goals  OT Goals(current goals can now be found in the care plan section)  Progress towards OT goals: Progressing toward goals     Plan      Co-evaluation                 AM-PAC OT "6 Clicks" Daily Activity     Outcome Measure   Help from another person eating meals?: A Little Help from another person taking care of personal grooming?: A Little Help from another person toileting, which  includes using toliet, bedpan, or urinal?: A Lot Help from another person bathing (including washing, rinsing, drying)?: A Lot Help from another person to put on and taking off regular upper body clothing?: A Little Help from another person to put on and taking off regular lower body clothing?: A Lot 6 Click Score: 15    End of Session Equipment Utilized During Treatment: Gait belt  OT Visit Diagnosis: Muscle weakness (generalized) (M62.81)   Activity Tolerance Patient tolerated treatment well   Patient Left with call bell/phone within reach;in bed   Nurse Communication          Time: 9311-2162 OT Time Calculation (min): 36 min  Charges: OT General Charges $OT Visit: 1 Visit OT Treatments $Self Care/Home Management : 23-37 mins  Lesle Chris, OTR/L Acute Rehabilitation Services [phone] [facility] pager [phone] office 01/21/2018   [place] 01/21/2018, 12:15 PM

## 2018-01-21 NOTE — Progress Notes (Signed)
[facility] PROGRESS NOTE  Ebony Scott INO:[id number] DOB: 1950/03/27 DOA: 01/14/2018  PCP: [name], MD  Brief History/Interval Summary: 67 y.o. female with medical history significant of allergic rhinitis, seasonal allergies, asthma, anemia, anxiety, depression, osteoarthritis, pseudogout, breast cancer in remission, cataracts, chronic lower back pain, chronic neck pain, bilateral DVTs, diverticulosis, fibromyalgia, GERD, Graves' disease, hyperthyroidism, headache, history of hemorrhoids, IBS, osteoporosis, history of pneumonia who was recently admitted to [facility] acute PE and DVT.  She was started on anticoagulation and was discharged to skilled nursing facility.  She did mention that she has been having diarrhea ever since that last hospitalization but presented with worsening GI symptoms with nausea vomiting and diarrhea.  She was found to be febrile.  C. difficile antigen was positive.  She was hospitalized for further management.  Patient was started on oral vancomycin.  She continued to have fever and diarrhea which appears to have improved over the last 2 days.  Then she also mentioned significant left hip pain which is thought to be due to pseudogout.  Started on steroids.  Reason for Visit: C. difficile.  Bilateral metatarsal fractures.  Left hip pain  Consultants: Phone discussion with Dr. Sharol Given with orthopedics.  Phone discussion also with Dr. Erlinda Hong with [facility] orthopedics  Procedures: Left hip arthrocentesis on 11/30  Antibiotics: Oral vancomycin 11/26  Subjective/Interval History: Patient states that she has noticed an improvement in her diarrhea.  She is noticing improvement in her left hip as well.     ROS: Denies any headaches  Objective:  Vital Signs  Vitals:   01/20/18 1948 01/20/18 2108 01/21/18 0620 01/21/18 0838  BP:  139/64 (!) 150/81   Pulse:  88 75   Resp:  18 18   Temp:  98.5 F (36.9 C) (!) 97.3 F (36.3 C)   TempSrc:  Oral Oral   SpO2: 96%  94% 94% 94%  Weight:      Height:        Intake/Output Summary (Last 24 hours) at 01/21/2018 1058 Last data filed at 01/21/2018 0900 Gross per 24 hour  Intake 900 ml  Output 500 ml  Net 400 ml   Filed Weights   01/14/18 1632 01/15/18 0828  Weight: 60.4 kg 60.2 kg    General appearance: Awake alert.  In no distress Resp: Few crackles at the bases which decreased with deep breathing.  No wheezing or rhonchi.  Normal effort. Cardio: S1-S2 is normal regular.  No S3-S4 GI: Abdomen is soft.  Nontender nondistended.  Bowel sounds are present normal.  No masses organomegaly Extremities: Still has restricted range of motion of the left hip though improved from yesterday.   Neurologic: No focal neurological deficits.  Lab Results:  Data Reviewed: I have personally reviewed following labs and imaging studies  CBC: Recent Labs  Lab 01/14/18 1707 01/15/18 0902 01/16/18 0453 01/17/18 0501 01/18/18 0524 01/20/18 0406  WBC 9.5 8.0 7.9 9.9 10.0 7.6  NEUTROABS 6.1 5.2  --   --   --   --   HGB 10.3* 8.8* 8.9* 9.3* 9.5* 9.8*  HCT 33.3* 28.6* 29.0* 30.4* 30.5* 31.6*  MCV 104.7* 103.6* 103.6* 104.8* 103.4* 100.6*  PLT 284 247 256 274 314 411*    Basic Metabolic Panel: Recent Labs  Lab 01/15/18 0902 01/16/18 0453 01/17/18 0501 01/20/18 0406 01/21/18 0452  NA 139 138 138 141 142  K 4.1 4.2 4.5 4.2 4.2  CL 111 111 107 109 109  CO2 21* 19* 21* 20*  24  GLUCOSE 89 90 96 159* 159*  BUN _0 28* 31*  CREATININE 0.87 0.93 0.85 0.81 0.84  CALCIUM 7.4* 7.7* 8.5* 9.7 9.2    GFR: Estimated Creatinine Clearance: 52.7 mL/min (by C-G formula based on SCr of 0.84 mg/dL).  Liver Function Tests: Recent Labs  Lab 01/14/18 1707 01/15/18 0902 01/17/18 0501 01/20/18 0406 01/21/18 0452  AST 69* 52* 42* 116* 379*  ALT 61* 51* 48* 100* 305*  ALKPHOS 70 62 64 87 92  BILITOT 1.8* 1.6* 0.8 0.4 0.3  PROT 7.3 5.8* 6.2* 6.7 6.0*  ALBUMIN 3.3* 2.6* 2.5* 2.3* 2.3*     Recent Results  (from the past 240 hour(s))  Blood Culture (routine x 2)     Status: None   Collection Time: 01/14/18  5:07 PM  Result Value Ref Range Status   Specimen Description   Final    LEFT ANTECUBITAL Blood Culture results may not be optimal due to an excessive volume of blood received in culture bottles BOTTLES DRAWN AEROBIC ONLY Performed at [facility], [place] [address] [place], [place] [address]    Special Requests   Final    NONE Performed at [facility], [place] [address], [place], [place] [address]    Culture   Final    NO GROWTH 5 DAYS Performed at [facility], [place] [address], [place], [place] [address]    Report Status 01/19/2018 FINAL  Final  Blood Culture (routine x 2)     Status: None   Collection Time: 01/14/18  5:17 PM  Result Value Ref Range Status   Specimen Description   Final    RIGHT ANTECUBITAL Blood Culture results may not be optimal due to an excessive volume of blood received in culture bottles BOTTLES DRAWN AEROBIC AND ANAEROBIC Performed at [place] [address], [place], [place] [address]    Special Requests   Final    NONE Performed at [facility], [place] [address], [place], [place] [address]    Culture   Final    NO GROWTH 5 DAYS Performed at [facility], [place] [address] [place], [place] [address]    Report Status 01/19/2018 FINAL  Final  Urine culture     Status: Abnormal   Collection Time: 01/14/18  6:45 PM  Result Value Ref Range Status   Specimen Description   Final    URINE, CLEAN CATCH Performed at [facility], [place] [address], [place], [place] [address]    Special Requests   Final    NONE Performed at [facility], [place] [address], [place], [place] [address]    Culture MULTIPLE SPECIES PRESENT, SUGGEST RECOLLECTION (A)  Final   Report Status 01/16/2018 FINAL  Final  Gastrointestinal Panel by PCR , Stool     Status:  None   Collection Time: 01/14/18  7:03 PM  Result Value Ref Range Status   Campylobacter species NOT DETECTED NOT DETECTED Final   Plesimonas shigelloides NOT DETECTED NOT DETECTED Final   Salmonella species NOT DETECTED NOT DETECTED Final   Yersinia enterocolitica NOT DETECTED NOT DETECTED Final   Vibrio species NOT DETECTED NOT DETECTED Final   Vibrio cholerae NOT DETECTED NOT DETECTED Final   Enteroaggregative E coli (EAEC) NOT DETECTED NOT DETECTED Final   Enteropathogenic E coli (EPEC) NOT DETECTED NOT DETECTED Final   Enterotoxigenic E coli (ETEC) NOT DETECTED NOT DETECTED Final   Shiga  like toxin producing E coli (STEC) NOT DETECTED NOT DETECTED Final   Shigella/Enteroinvasive E coli (EIEC) NOT DETECTED NOT DETECTED Final   Cryptosporidium NOT DETECTED NOT DETECTED Final   Cyclospora cayetanensis NOT DETECTED NOT DETECTED Final   Entamoeba histolytica NOT DETECTED NOT DETECTED Final   Giardia lamblia NOT DETECTED NOT DETECTED Final   Adenovirus F40/41 NOT DETECTED NOT DETECTED Final   Astrovirus NOT DETECTED NOT DETECTED Final   Norovirus GI/GII NOT DETECTED NOT DETECTED Final   Rotavirus A NOT DETECTED NOT DETECTED Final   Sapovirus (I, II, IV, and V) NOT DETECTED NOT DETECTED Final    Comment: Performed at [facility] [place] [place], [place] [address]  C difficile quick scan w PCR reflex     Status: Abnormal   Collection Time: 01/14/18  7:03 PM  Result Value Ref Range Status   C Diff antigen POSITIVE (A) NEGATIVE Final   C Diff toxin NEGATIVE NEGATIVE Final   C Diff interpretation Results are indeterminate. See PCR results.  Final    Comment: Performed at [facility], [place] [address] [place], [place] [address]  C. Diff by PCR, Reflexed     Status: Abnormal   Collection Time: 01/14/18  7:03 PM  Result Value Ref Range Status   Toxigenic C. Difficile by PCR POSITIVE (A) NEGATIVE Final    Comment: Positive for toxigenic C. difficile  with little to no toxin production. Only treat if clinical presentation suggests symptomatic illness. Performed at [facility], [place] [address], [place], [place] [address]   Culture, blood (Routine X 2) w Reflex to ID Panel     Status: None (Preliminary result)   Collection Time: 01/16/18  2:35 PM  Result Value Ref Range Status   Specimen Description   Final    BLOOD RIGHT ANTECUBITAL Performed at [place] [address] [place], [place] [address]    Special Requests   Final    BOTTLES DRAWN AEROBIC AND ANAEROBIC Blood Culture adequate volume Performed at [place] [address] [place], [place] [address]    Culture   Final    NO GROWTH 4 DAYS Performed at [facility], [place] [address] [place], [place] [address]    Report Status PENDING  Incomplete  Culture, blood (Routine X 2) w Reflex to ID Panel     Status: None (Preliminary result)   Collection Time: 01/16/18  2:40 PM  Result Value Ref Range Status   Specimen Description   Final    BLOOD RIGHT ARM Performed at [place] [address], [place], [place] [address]    Special Requests   Final    BOTTLES DRAWN AEROBIC AND ANAEROBIC Blood Culture adequate volume Performed at [place] [address], [place], [place] [address]    Culture   Final    NO GROWTH 4 DAYS Performed at [facility], [place] [address], [place], [place] [address]    Report Status PENDING  Incomplete  Body fluid culture     Status: None (Preliminary result)   Collection Time: 01/18/18  8:50 AM  Result Value Ref Range Status   Specimen Description   Final    HIP LEFT Performed at [place] [address] [place], [place] [address]    Special Requests   Final    LESS THAN ONE DROP OF FLUID GRAM AND [place] PLATE ONLY  Gram Stain   Final    RARE WBC PRESENT, PREDOMINANTLY PMN NO ORGANISMS SEEN    Culture   Final    NO  GROWTH 3 DAYS INTERPRET RESULTS WITH CAUTION DUE TO LIMITED SPECIMEN VOLUME Performed at [facility], [place] [address], [place], [place] [address]    Report Status PENDING  Incomplete      Radiology Studies: No results found.   Medications:  Scheduled: . calcium carbonate  1 tablet Oral BID WC  . cholecalciferol  1,000 Units Oral Daily  . feeding supplement (GLUCERNA SHAKE)  237 mL Oral TID BM  . feeding supplement (PRO-STAT SUGAR FREE 64)  30 mL Oral TID WC  . ferrous sulfate  325 mg Oral Q breakfast  . gabapentin  300 mg Oral BID  . lactase  6,000 Units Oral TID WC  . lipase/protease/amylase  24,000 Units Oral TID WC  . methylPREDNISolone (SOLU-MEDROL) injection  40 mg Intravenous Q12H  . mirtazapine  30 mg Oral QHS  . mometasone-formoterol  1 puff Inhalation BID  . montelukast  10 mg Oral Daily  . nystatin  5 mL Mouth/Throat QHS  . rivaroxaban  20 mg Oral Q supper  . saccharomyces boulardii  250 mg Oral BID  . umeclidinium bromide  1 puff Inhalation Daily  . vancomycin  125 mg Oral QID   Continuous:  KDT:[id number] **OR** acetaminophen, LORazepam, ondansetron **OR** ondansetron (ZOFRAN) IV, simethicone, sodium chloride, sorbitol, traMADol    Assessment/Plan:  C. difficile colitis/persistent fever Patient presented with nausea vomiting and profuse diarrhea.  Stool studies were done.  C. difficile antigen was positive.  Toxin negative.  PCR positive.  GI pathogen panel unremarkable.  Since patient had significant abdominal tenderness a CT scan was done.  This did not show any acute findings.  Patient's fever has resolved.  Diarrhea appears to be improving.  Continue with oral vancomycin. Cough is better and thought to be secondary to atelectasis.  Chest x-ray did not show any acute findings.  Patient encouraged to do incentive spirometry.    Left hip pain most likely due to pseudogout Plain films do not show any acute findings.  No fracture identified on the CT  scan of the pelvis either.  Since she continued to have significant symptoms with limited range of motion MRI was done which shows a small joint effusion and also shows partial tear of the gluteus minimus with edema.  All these findings could be causing her symptoms.  Discussed with orthopedic surgeon Dr. Erlinda Hong who recommended arthrocentesis.  Patient underwent arthrocentesis on 11/30 however only a minimal amount of clear yellow fluid was aspirated.  It was not enough for cell counts or crystal examination.  It was sent for cultures which is negative so far.  Patient's ESR noted to be significantly elevated at 138.  CRP 15.8.  All this could be suggestive of severe inflammation.  Patient with known history of pseudogout.  Since fevers have subsided and this appears to be an inflammatory process rather than a septic joint, patient was started on a trial of steroids.  Patient symptoms appear to be improving.  Slowly cut back on his steroids.  She is still requiring IV steroids.  We will recheck her ESR and CRP tomorrow.  PT and OT to reevaluate.     Close fracture of the fifth metatarsal bone bilaterally The patient is supposed to follow-up with orthopedic surgeon, Dr. Sharol Given next week.  Her appointment is on 12/4.  She probably will  not make this appointment.  This will need to be rescheduled. Discussed with Dr. Sharol Given on 11/28 who recommends postop shoes bilaterally and weightbearing as tolerated.    Abnormal LFTs Old labs reviewed.  Patient's AST and ALT were elevated in October as well.  She did undergo right upper quadrant ultrasound at that time which did not show any acute findings.  CT scan also did not show any acute hepatic findings.  She is status post cholecystectomy.  Hepatitis panel is pending.  AST ALT noted to be significantly elevated.  Medications reviewed.  We will stop her Singulair which does have some hepatic side effects.  Solu-Medrol also can cause hepatic side effects.  We will cut down on the  dose.  Recheck labs tomorrow.  History of chronic diastolic CHF Initially patient was volume depleted and was given IV fluids.  IV fluids discontinued.  Patient has good oral intake.  Chronic kidney disease stage III Renal function improved compared to last month.  Continue to monitor urine output.    Macrocytic anemia Drop in hemoglobin likely dilutional.  No evidence of overt bleeding.  Anemia panel from a month ago reviewed.  No clear-cut deficiencies identified.  TSH was 5.0 in October.  Hemoglobin stable.  History of acute PE and DVT diagnosed recently Continue Xarelto  History of depression  continue Remeron  History of pseudogout See above.  Hyperthyroidism No longer on methimazole. TSH was 5.0 in October.  Outpatient monitoring.  Severe protein calorie malnutrition Nutritional supplements  Patient with active C. difficile requiring oral vancomycin, also with significant left hip pain due to severe inflammation placing her at risk for falls.  Requiring IV steroids which can make her immunosuppressed and could potentially make the diarrhea worse and so needs close monitoring.   DVT Prophylaxis: On Xarelto    Code Status: Full code Family Communication: Discussed with the patient Disposition Plan: Management as outlined above.  Cut back on IV steroids.  We will see how she does with PT and OT today.      LOS: 6 days   [facility] Pager [phone] 01/21/2018, 10:58 AM  If 7PM-7AM, please contact night-coverage at [email or web address], password [facility]

## 2018-01-21 NOTE — Progress Notes (Signed)
Physical Therapy Treatment Patient Details Name: Ebony Scott MRN: [id number] DOB: 1950/10/04 Today's Date: 01/21/2018    History of Present Illness pt was admitted with nausea/vomiting/diarrhea.  She was discharged to SNF earlier this month after hospitalization at [facility] and  home from [facility] 01/08/18 with HHPT/OT and home instead (2x day).  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  Pt will need assist at home. Has a daughter but she lives in [place].   Pt OOB in recliner.  Assisted with amb in hallway and practiced stairs using one rail.  Progressing with her mobility.  Follow Up Recommendations  SNF(pt declines SNF so will need HH PT /24/7 assist) Pt lives home alone     Equipment Recommendations  None recommended by PT    Recommendations for Other Services       Precautions / Restrictions Precautions Precautions: Fall Precaution Comments: L hip AVN and B 5th metetarsal Fx's MRI shows slight L gluteus Minimus tear Restrictions Weight Bearing Restrictions: No RLE Weight Bearing: Weight bearing as tolerated LLE Weight Bearing: Weight bearing as tolerated    Mobility  Bed Mobility               General bed mobility comments: OOB in recliner   Transfers   Equipment used: Rolling walker (2 wheeled) Transfers: Sit to/from Stand Sit to Stand: Supervision;Min guard Stand pivot transfers: Supervision;Min guard       General transfer comment: 25% VC's on proper hand placement and safety with turns   Ambulation/Gait Ambulation/Gait assistance: Min guard Gait Distance (Feet): 85 Feet Assistive device: Rolling walker (2 wheeled) Gait Pattern/deviations:  Step-to pattern;Step-through pattern;Decreased stance time - left Gait velocity: decreased    General Gait Details: pt was able to amb with a RW a functional distance with decrease anxiety.  Amb better with grippy socks vs B post op shoes which slid (standard fit) and increase tripping hazzard.     Stairs Stairs: Yes Stairs assistance: Supervision;Min guard Stair Management: One rail Left;Step to pattern;Sideways Number of Stairs: 3 General stair comments: pt was able to perform 3 stair steps using one rail with VC's on proper sequencing and safety.     Wheelchair Mobility    Modified Rankin (Stroke Patients Only)       Balance                                            Cognition Arousal/Alertness: Awake/alert Behavior During Therapy: WFL for tasks assessed/performed Overall Cognitive Status: Within Functional Limits for tasks assessed                                        Exercises      General Comments        Pertinent Vitals/Pain Pain Assessment: Faces Faces Pain Scale: Hurts little more Pain Location: L heel Pain Descriptors / Indicators: Discomfort Pain Intervention(s): Monitored during session;Repositioned    Home Living  Prior Function            PT Goals (current goals can now be found in the care plan section) Progress towards PT goals: Progressing toward goals    Frequency    Min 3X/week      PT Plan Discharge plan needs to be updated(pt declining SNF and wants to go home due to financial )    Co-evaluation              AM-PAC PT "6 Clicks" Mobility   Outcome Measure  Help needed turning from your back to your side while in a flat bed without using bedrails?: A Little Help needed moving from lying on your back to sitting on the side of a flat bed without using bedrails?: A Little Help needed moving to and from a bed to a chair (including a wheelchair)?: A  Little Help needed standing up from a chair using your arms (e.g., wheelchair or bedside chair)?: A Little Help needed to walk in hospital room?: A Little Help needed climbing 3-5 steps with a railing? : A Little 6 Click Score: 18    End of Session Equipment Utilized During Treatment: Gait belt Activity Tolerance: Patient tolerated treatment well Patient left: in chair;with call bell/phone within reach Nurse Communication: Mobility status PT Visit Diagnosis: Difficulty in walking, not elsewhere classified (R26.2);Pain;Other abnormalities of gait and mobility (R26.89);Muscle weakness (generalized) (M62.81) Pain - Right/Left: Left Pain - part of body: (heel)     Time: 3244-0102 PT Time Calculation (min) (ACUTE ONLY): 25 min  Charges:  $Gait Training: 8-22 mins $Therapeutic Activity: 8-22 mins                     Rica Koyanagi  PTA Acute  Rehabilitation Services Pager      [phone] Office      [phone]

## 2018-01-22 ENCOUNTER — Ambulatory Visit (INDEPENDENT_AMBULATORY_CARE_PROVIDER_SITE_OTHER): Payer: Medicare Other | Admitting: Orthopedic Surgery

## 2018-01-22 DIAGNOSIS — S92351A Displaced fracture of fifth metatarsal bone, right foot, initial encounter for closed fracture: Secondary | ICD-10-CM

## 2018-01-22 DIAGNOSIS — A0472 Enterocolitis due to Clostridium difficile, not specified as recurrent: Principal | ICD-10-CM

## 2018-01-22 DIAGNOSIS — S92352D Displaced fracture of fifth metatarsal bone, left foot, subsequent encounter for fracture with routine healing: Secondary | ICD-10-CM

## 2018-01-22 LAB — COMPREHENSIVE METABOLIC PANEL
ALT: 290 U/L — ABNORMAL HIGH (ref 0–44)
AST: 204 U/L — ABNORMAL HIGH (ref 15–41)
Albumin: 2.4 g/dL — ABNORMAL LOW (ref 3.5–5.0)
Alkaline Phosphatase: 91 U/L (ref 38–126)
Anion gap: 10 (ref 5–15)
BUN: 34 mg/dL — ABNORMAL HIGH (ref 8–23)
CO2: 27 mmol/L (ref 22–32)
Calcium: 9.3 mg/dL (ref 8.9–10.3)
Chloride: 104 mmol/L (ref 98–111)
Creatinine, Ser: 0.91 mg/dL (ref 0.44–1.00)
GFR calc Af Amer: >60 mL/min (ref >60)
GFR calc non Af Amer: >60 mL/min (ref >60)
Glucose, Bld: 122 mg/dL — ABNORMAL HIGH (ref 70–99)
Potassium: 4.7 mmol/L (ref 3.5–5.1)
Sodium: 141 mmol/L (ref 135–145)
Total Bilirubin: 0.6 mg/dL (ref 0.3–1.2)
Total Protein: 6.2 g/dL — ABNORMAL LOW (ref 6.5–8.1)

## 2018-01-22 LAB — CBC
HCT: 31.6 % — ABNORMAL LOW (ref 36.0–46.0)
Hemoglobin: 9.9 g/dL — ABNORMAL LOW (ref 12.0–15.0)
MCH: 32.1 pg (ref 26.0–34.0)
MCHC: 31.3 g/dL (ref 30.0–36.0)
MCV: 102.6 fL — ABNORMAL HIGH (ref 80.0–100.0)
Platelets: 467 10*3/uL — ABNORMAL HIGH (ref 150–400)
RBC: 3.08 MIL/uL — ABNORMAL LOW (ref 3.87–5.11)
RDW: 15.1 % (ref 11.5–15.5)
WBC: 16.3 10*3/uL — ABNORMAL HIGH (ref 4.0–10.5)
nRBC: 0.7 % — ABNORMAL HIGH (ref 0.0–0.2)

## 2018-01-22 LAB — BODY FLUID CULTURE

## 2018-01-22 LAB — HEPATITIS PANEL, ACUTE
HCV Ab: <0.1 s/co ratio (ref 0.0–0.9)
Hep A IgM: NEGATIVE
Hep B C IgM: NEGATIVE
Hepatitis B Surface Ag: NEGATIVE

## 2018-01-22 LAB — C-REACTIVE PROTEIN: CRP: 6.5 mg/dL — ABNORMAL HIGH (ref <1.0)

## 2018-01-22 LAB — SEDIMENTATION RATE: Sed Rate: 121 mm/hr — ABNORMAL HIGH (ref 0–22)

## 2018-01-22 MED ORDER — PREDNISONE 20 MG PO TABS
40.0000 mg | ORAL_TABLET | Freq: Every day | ORAL | Status: DC
  Administered 2018-01-23: 40 mg via ORAL
  Filled 2018-01-22: qty 2

## 2018-01-22 MED ORDER — ALUM & MAG HYDROXIDE-SIMETH 200-200-20 MG/5ML PO SUSP
30.0000 mL | Freq: Once | ORAL | Status: AC
  Administered 2018-01-22: 30 mL via ORAL
  Filled 2018-01-22: qty 30

## 2018-01-22 MED ORDER — FAMOTIDINE IN NACL 20-0.9 MG/50ML-% IV SOLN
20.0000 mg | Freq: Two times a day (BID) | INTRAVENOUS | Status: DC
  Administered 2018-01-23: 20 mg via INTRAVENOUS
  Filled 2018-01-22 (×3): qty 50

## 2018-01-22 MED ORDER — LIDOCAINE VISCOUS HCL 2 % MT SOLN
15.0000 mL | Freq: Once | OROMUCOSAL | Status: AC
  Administered 2018-01-22: 15 mL via ORAL
  Filled 2018-01-22: qty 15

## 2018-01-22 NOTE — Progress Notes (Signed)
Patient ID: Ebony Scott, female   DOB: 1950/02/29, 67 y.o.   MRN: [id number]  PROGRESS NOTE    JHAYLA PODGORSKI  FGH:[id number] DOB: [birth year] DOA: 01/14/2018 PCP: [name], MD   Brief Narrative:  67 year old female with history of allergic rhinitis, seasonal allergies, asthma, anemia, anxiety, depression, also arthritis, pseudogout, breast cancer in remission, cataracts, chronic lower back pain, chronic neck pain, bilateral DVTs, diverticulosis, fibromyalgia, GERD, Graves' disease, hyperthyroidism, IBS, osteoporosis, pneumonia who was recently admitted to [facility] for acute PE and DVT and was started on anticoagulation and was discharged to skilled nursing facility presented with worsening GI symptoms with nausea, vomiting and diarrhea.  She was started on oral vancomycin for positive C. difficile antigen.  She had significant left hip pain which is thought to be pseudogout and was started on steroids.   Assessment & Plan:   Principal Problem:   Clostridium difficile colitis Active Problems:   Hyperthyroidism   GERD   Pseudogout   Chronic diastolic CHF (congestive heart failure) (HCC)   CKD (chronic kidney disease), stage III (HCC)   Debility   Clostridium difficile diarrhea   DVT (deep venous thrombosis) (HCC)   Depression   Closed fracture of fifth metatarsal bone of right foot, initial encounter   C. difficile colitis -Diarrhea improving.  Continue oral vancomycin to finish 10 to 14-day course of therapy. -Currently afebrile.  Left hip pain most likely due to acute pseudogout -X-ray/CT scan of the pelvis was negative for acute abnormality.  MRI showed small joint effusion and also partial tear of the gluteus minimus with edema.  These findings were discussed with orthopedic surgeon Dr. Erlinda Hong by prior hospitalist who recommended arthrocentesis.  Patient underwent arthrocentesis on 01/01/2018 however only had minimal amount of clear fluid yellow fluid was aspirated and it was not enough  for cell counts or crystal examination.  Cultures have been negative so far.  Patient had extremely elevated CRP at 15.8.  CRP improving today at 6.5. -Patient was empirically started on intravenous Solu-Medrol.  We will switch to oral prednisone from tomorrow.  Continue PT eval  Bilateral fifth metatarsal closed fracture -Patient requesting evaluation by orthopedics inpatient.  Notified Dr. Sharol Given who will see the patient inpatient. -Patient is asking for different kind of shoes  Abnormal LFTs -Improving -LFTs were elevated in November 25, 2017 as well for which right upper quadrant ultrasound/CT abdomen was negative for any acute findings.  She is status post cholecystectomy.  No worsening abdominal pain -Repeat a.m. labs.  Off Singulair  History of chronic diastolic CHF -Compensated.  Of IV fluids.  Monitor  Chronic kidney disease stage III -Stable.  Monitor  Generalized deconditioning -Continue PT eval.  Patient is declining SNF placement.  Might need home health -Patient still feels very deconditioned and does not feel ready for discharge home.  She thinks she will benefit from PT evaluation inpatient.  Her appetite is still poor.  might need 1 more day in the hospital prior to discharge home with home health.  Macrocytic anemia -Hemoglobin stable.  Monitor  History of acute PE and DVT diagnosed recently -Continue Xarelto  Depression -Continue Remeron  Hyperthyroidism -No longer on methimazole.  TSH was 5 in October 2019.  Outpatient monitor  Severe protein calorie malnutrition -Follow nutrition recommendations   DVT prophylaxis: Xarelto Code Status: Full Family Communication: None at bedside Disposition Plan: Home in 1 to 2 days once clinically improved  Consultants: Orthopedics  Procedures: Left hip arthrocentesis on 01/18/2018  Antimicrobials:  Oral vancomycin from 01/14/2018 onwards  Subjective: Patient seen and examined at bedside.  She states that her diarrhea  is improving.  Her appetite is still poor.  She feels very weak and not ready for discharge today.  No overnight fever or vomiting  Objective: Vitals:   01/21/18 0838 01/21/18 1310 01/21/18 2129 01/22/18 0429  BP:  (!) 143/78 (!) 153/86 (!) 161/87  Pulse:  79 78 73  Resp:  18 15 16   Temp:  (!) 97.5 F (36.4 C) 97.7 F (36.5 C) 97.9 F (36.6 C)  TempSrc:  Oral Oral Oral  SpO2: 94% 96% 95% 97%  Weight:    56.6 kg  Height:        Intake/Output Summary (Last 24 hours) at 01/22/2018 1429 Last data filed at 01/22/2018 1324 Gross per 24 hour  Intake 420 ml  Output 2 ml  Net 418 ml   Filed Weights   01/14/18 1632 01/15/18 0828 01/22/18 0429  Weight: 60.4 kg 60.2 kg 56.6 kg    Examination:  General exam: Appears calm and comfortable but looks older than stated age Respiratory system: Bilateral decreased breath sounds at bases Cardiovascular system: S1 & S2 heard, Rate controlled Gastrointestinal system: Abdomen is nondistended, soft and nontender. Normal bowel sounds heard. Extremities: No cyanosis, edema   Data Reviewed: I have personally reviewed following labs and imaging studies  CBC: Recent Labs  Lab 01/16/18 0453 01/17/18 0501 01/18/18 0524 01/20/18 0406 01/22/18 0413  WBC 7.9 9.9 10.0 7.6 16.3*  HGB 8.9* 9.3* 9.5* 9.8* 9.9*  HCT 29.0* 30.4* 30.5* 31.6* 31.6*  MCV 103.6* 104.8* 103.4* 100.6* 102.6*  PLT 256 274 314 411* 401*   Basic Metabolic Panel: Recent Labs  Lab 01/16/18 0453 01/17/18 0501 01/20/18 0406 01/21/18 0452 01/22/18 0413  NA 138 138 141 142 141  K 4.2 4.5 4.2 4.2 4.7  CL 111 107 109 109 104  CO2 19* 21* 20* 24 27  GLUCOSE 90 96 159* 159* 122*  BUN 10 13 28* 31* 34*  CREATININE 0.93 0.85 0.81 0.84 0.91  CALCIUM 7.7* 8.5* 9.7 9.2 9.3   GFR: Estimated Creatinine Clearance: 44 mL/min (by C-G formula based on SCr of 0.91 mg/dL). Liver Function Tests: Recent Labs  Lab 01/17/18 0501 01/20/18 0406 01/21/18 0452 01/22/18 0413  AST 42*  116* 379* 204*  ALT 48* 100* 305* 290*  ALKPHOS 64 87 92 91  BILITOT 0.8 0.4 0.3 0.6  PROT 6.2* 6.7 6.0* 6.2*  ALBUMIN 2.5* 2.3* 2.3* 2.4*   No results for input(s): LIPASE, AMYLASE in the last 168 hours. No results for input(s): AMMONIA in the last 168 hours. Coagulation Profile: No results for input(s): INR, PROTIME in the last 168 hours. Cardiac Enzymes: No results for input(s): CKTOTAL, CKMB, CKMBINDEX, TROPONINI in the last 168 hours. BNP (last 3 results) No results for input(s): PROBNP in the last 8760 hours. HbA1C: No results for input(s): HGBA1C in the last 72 hours. CBG: No results for input(s): GLUCAP in the last 168 hours. Lipid Profile: No results for input(s): CHOL, HDL, LDLCALC, TRIG, CHOLHDL, LDLDIRECT in the last 72 hours. Thyroid Function Tests: No results for input(s): TSH, T4TOTAL, FREET4, T3FREE, THYROIDAB in the last 72 hours. Anemia Panel: No results for input(s): VITAMINB12, FOLATE, FERRITIN, TIBC, IRON, RETICCTPCT in the last 72 hours. Sepsis Labs: No results for input(s): PROCALCITON, LATICACIDVEN in the last 168 hours.  Recent Results (from the past 240 hour(s))  Blood Culture (routine x 2)     Status:  None   Collection Time: 01/14/18  5:07 PM  Result Value Ref Range Status   Specimen Description   Final    LEFT ANTECUBITAL Blood Culture results may not be optimal due to an excessive volume of blood received in culture bottles BOTTLES DRAWN AEROBIC ONLY Performed at [facility] [address], [place], [place] [address]    Special Requests   Final    NONE Performed at [facility], [place] [address], [place], [place] [address]    Culture   Final    NO GROWTH 5 DAYS Performed at [facility], [place] [address] [place], [place] [address]    Report Status 01/19/2018 FINAL  Final  Blood Culture (routine x 2)     Status: None   Collection Time: 01/14/18  5:17 PM  Result Value Ref Range Status   Specimen  Description   Final    RIGHT ANTECUBITAL Blood Culture results may not be optimal due to an excessive volume of blood received in culture bottles BOTTLES DRAWN AEROBIC AND ANAEROBIC Performed at [facility] [address], [place], [place] [address]    Special Requests   Final    NONE Performed at [facility], [place] [address] [place], [place] [address]    Culture   Final    NO GROWTH 5 DAYS Performed at [facility], [place] [address], [place], [place] [address]    Report Status 01/19/2018 FINAL  Final  Urine culture     Status: Abnormal   Collection Time: 01/14/18  6:45 PM  Result Value Ref Range Status   Specimen Description   Final    URINE, CLEAN CATCH Performed at [facility], [place] [address] [place], [place] [address]    Special Requests   Final    NONE Performed at [facility], [place] [address], [place], [place] [address]    Culture MULTIPLE SPECIES PRESENT, SUGGEST RECOLLECTION (A)  Final   Report Status 01/16/2018 FINAL  Final  Gastrointestinal Panel by PCR , Stool     Status: None   Collection Time: 01/14/18  7:03 PM  Result Value Ref Range Status   Campylobacter species NOT DETECTED NOT DETECTED Final   Plesimonas shigelloides NOT DETECTED NOT DETECTED Final   Salmonella species NOT DETECTED NOT DETECTED Final   Yersinia enterocolitica NOT DETECTED NOT DETECTED Final   Vibrio species NOT DETECTED NOT DETECTED Final   Vibrio cholerae NOT DETECTED NOT DETECTED Final   Enteroaggregative E coli (EAEC) NOT DETECTED NOT DETECTED Final   Enteropathogenic E coli (EPEC) NOT DETECTED NOT DETECTED Final   Enterotoxigenic E coli (ETEC) NOT DETECTED NOT DETECTED Final   Shiga like toxin producing E coli (STEC) NOT DETECTED NOT DETECTED Final   Shigella/Enteroinvasive E coli (EIEC) NOT DETECTED NOT DETECTED Final   Cryptosporidium NOT DETECTED NOT DETECTED Final   Cyclospora cayetanensis NOT  DETECTED NOT DETECTED Final   Entamoeba histolytica NOT DETECTED NOT DETECTED Final   Giardia lamblia NOT DETECTED NOT DETECTED Final   Adenovirus F40/41 NOT DETECTED NOT DETECTED Final   Astrovirus NOT DETECTED NOT DETECTED Final   Norovirus GI/GII NOT DETECTED NOT DETECTED Final   Rotavirus A NOT DETECTED NOT DETECTED Final   Sapovirus (I, II, IV, and V) NOT DETECTED NOT DETECTED Final    Comment: Performed at [facility], [address] [place], [place] [address]  C difficile quick scan w PCR reflex  Status: Abnormal   Collection Time: 01/14/18  7:03 PM  Result Value Ref Range Status   C Diff antigen POSITIVE (A) NEGATIVE Final   C Diff toxin NEGATIVE NEGATIVE Final   C Diff interpretation Results are indeterminate. See PCR results.  Final    Comment: Performed at [facility], [place] [address] [place], [place] [address]  C. Diff by PCR, Reflexed     Status: Abnormal   Collection Time: 01/14/18  7:03 PM  Result Value Ref Range Status   Toxigenic C. Difficile by PCR POSITIVE (A) NEGATIVE Final    Comment: Positive for toxigenic C. difficile with little to no toxin production. Only treat if clinical presentation suggests symptomatic illness. Performed at [facility], [place] [address], [place], [place] [address]   Culture, blood (Routine X 2) w Reflex to ID Panel     Status: None   Collection Time: 01/16/18  2:35 PM  Result Value Ref Range Status   Specimen Description   Final    BLOOD RIGHT ANTECUBITAL Performed at [facility] [address], [name], [place] [address]    Special Requests   Final    BOTTLES DRAWN AEROBIC AND ANAEROBIC Blood Culture adequate volume Performed at [facility] [address], [place], [place] [address]    Culture   Final    NO GROWTH 5 DAYS Performed at [facility], [place] [address] [place], [place] [address]    Report Status 01/21/2018 FINAL  Final  Culture,  blood (Routine X 2) w Reflex to ID Panel     Status: None   Collection Time: 01/16/18  2:40 PM  Result Value Ref Range Status   Specimen Description   Final    BLOOD RIGHT ARM Performed at [facility] [address], [place], [place] [address]    Special Requests   Final    BOTTLES DRAWN AEROBIC AND ANAEROBIC Blood Culture adequate volume Performed at [facility] [address] [place], [place] [address]    Culture   Final    NO GROWTH 5 DAYS Performed at [facility], [place] [address] [place], [place] [address]    Report Status 01/21/2018 FINAL  Final  Body fluid culture     Status: None   Collection Time: 01/18/18  8:50 AM  Result Value Ref Range Status   Specimen Description   Final    HIP LEFT Performed at [facility] [address] [place], [place] [address]    Special Requests   Final    LESS THAN ONE DROP OF FLUID GRAM AND CHOC PLATE ONLY   Gram Stain   Final    RARE WBC PRESENT, PREDOMINANTLY PMN NO ORGANISMS SEEN    Culture   Final    NO GROWTH 4 DAYS INTERPRET RESULTS WITH CAUTION DUE TO LIMITED SPECIMEN VOLUME Performed at [facility], [place] [address] [place], [place] [address]    Report Status 01/22/2018 FINAL  Final         Radiology Studies: No results found.      Scheduled Meds: . calcium carbonate  1 tablet Oral BID WC  . cholecalciferol  1,000 Units Oral Daily  . feeding supplement (GLUCERNA SHAKE)  237 mL Oral TID BM  . feeding supplement (PRO-STAT SUGAR FREE 64)  30 mL Oral TID WC  . ferrous sulfate  325 mg Oral Q breakfast  . gabapentin  300 mg Oral BID  . lactase  6,000 Units Oral TID WC  . lipase/protease/amylase  24,000 Units Oral TID WC  . methylPREDNISolone (SOLU-MEDROL) injection  40 mg Intravenous Daily  . mirtazapine  30 mg Oral QHS  . mometasone-formoterol  1 puff Inhalation BID  . nystatin  5 mL Mouth/Throat QHS  . rivaroxaban  20 mg Oral Q supper  .  saccharomyces boulardii  250 mg Oral BID  . umeclidinium bromide  1 puff Inhalation Daily  . vancomycin  125 mg Oral QID   Continuous Infusions:   LOS: 7 days        Aline August, MD [facility] Pager [phone]  If 7PM-7AM, please contact night-coverage [email or web address] Password [email or web address] 01/22/2018, 2:29 PM

## 2018-01-22 NOTE — Consult Note (Signed)
ORTHOPAEDIC CONSULTATION  REQUESTING PHYSICIAN: Aline August, MD  Chief Complaint: Bilateral stress fractures base of the fifth metatarsal  HPI: Ebony Scott is a 67 y.o. female who presents with bilateral stress fractures of the base of the fifth metatarsal at the metaphyseal diaphyseal junction.  Past Medical History:  Diagnosis Date  . Allergic rhinitis   . Allergy    SEASONAL  . Anemia   . Anxiety    pt denies  . Arthritis    "mostly the hands" (02/20/2012)  . Asthma   . Breast cancer ([place]) 1998   in remission  . Cataract    REMOVED  . Chronic lower back pain   . Chronic neck pain   . Complication of anesthesia    "had hard time waking up from it several times" (02/20/2012)  . Depression    "some; don't take anything for it" (02/20/2012)  . Diverticulosis   . DVT (deep venous thrombosis) ([place])   . Exertional dyspnea   . Fibromyalgia 11/2011  . GERD (gastroesophageal reflux disease)   . Graves disease   . Headache(784.0)    "related to allergies; more at different times during the year" (02/20/2012)  . Hemorrhoids   . Hiatal hernia    back and neck  . Hx of adenomatous colonic polyps 04/12/2016  . Hypercholesteremia    good cholesterol is high  . Hypothyroidism   . IBS (irritable bowel syndrome)   . Moderate persistent asthma    -FeV1 72% 2011, -IgE 102 2011, CT sinus Neg 2011  . Osteoporosis    on reclast yearly  . Pneumonia 04/2011; ~ 11/2011   "double; single" (02/20/2012)   Past Surgical History:  Procedure Laterality Date  . ANTERIOR AND POSTERIOR REPAIR  1990's  . APPENDECTOMY    . BREAST LUMPECTOMY  1998   left  . CARPOMETACARPEL ([place]) FUSION OF THUMB WITH AUTOGRAFT FROM RADIUS  ~ 2009   "both thumbs" (02/20/2012)  . CATARACT EXTRACTION W/ INTRAOCULAR LENS  IMPLANT, BILATERAL  2012  . CERVICAL DISCECTOMY  10/2001   C5-C6  . CERVICAL FUSION  2003   C3-C4  . CHOLECYSTECTOMY    . COLONOSCOPY    . DEBRIDEMENT TENNIS ELBOW  ?1970's   right  .  ESOPHAGOGASTRODUODENOSCOPY    . HYSTERECTOMY    . KNEE ARTHROPLASTY  ?1990's   "?right; w/cartilage repair" (02/20/2012)  . NASAL SEPTUM SURGERY  1980's  . POSTERIOR CERVICAL FUSION/FORAMINOTOMY  2004   "failed initial fusion; rewired  anterior neck" (02/20/2012)  . TONSILLECTOMY  ~ 1953  . VESICOVAGINAL FISTULA CLOSURE W/ TAH  1988  . VIDEO BRONCHOSCOPY Bilateral 08/23/2016   Procedure: VIDEO BRONCHOSCOPY WITH FLUORO;  Surgeon: Javier Glazier, MD;  Location: [facility];  Service: Cardiopulmonary;  Laterality: Bilateral;   Social History   Socioeconomic History  . Marital status: Divorced    Spouse name: Not on file  . Number of children: 2  . Years of education: college  . Highest education level: Not on file  Occupational History  . Occupation: Disabled    Comment: Retired Engineer, production: RETIRED  Social Needs  . Financial resource strain: Not on file  . Food insecurity:    Worry: Not on file    Inability: Not on file  . Transportation needs:    Medical: Not on file    Non-medical: Not on file  Tobacco Use  . Smoking status: Passive Smoke Exposure - Never Smoker  .  Smokeless tobacco: Never Used  . Tobacco comment: Parents  Substance and Sexual Activity  . Alcohol use: Yes    Alcohol/week: 0.0 standard drinks    Comment: 02/20/2012 "couple glasses of wine/6 months"  . Drug use: No  . Sexual activity: Never  Lifestyle  . Physical activity:    Days per week: Not on file    Minutes per session: Not on file  . Stress: Not on file  Relationships  . Social connections:    Talks on phone: Not on file    Gets together: Not on file    Attends religious service: Not on file    Active member of club or organization: Not on file    Attends meetings of clubs or organizations: Not on file    Relationship status: Not on file  Other Topics Concern  . Not on file  Social History Narrative   Patient lives at home alone. Patient  divorced.    Patient  has her BS degree.   Right handed.   Caffeine- sometimes coffee.      [facility]:   Born in [place], [place]. She worked as a Copywriter, advertising. She has no pets currently. She does have indoor plants. Previously had mold in her home that was remediated. Carpet was removed.          Family History  Problem Relation Age of Onset  . Allergies Mother   . Heart disease Mother   . Arthritis Mother   . Lung cancer Mother   . Diabetes Mother   . Allergies Father   . Heart disease Father   . Arthritis Father   . Stroke Father   . Colon cancer Unknown        Maternal half aunt/Maternal half uncle  . Colitis Daughter   . Diabetes Maternal Grandfather    - negative except otherwise stated in the family history section Allergies  Allergen Reactions  . Dust Mite Extract Shortness Of Breath and Other (See Comments)    "sneezing" (02/20/2012)  . Molds & Smuts Shortness Of Breath  . Morphine Sulfate Itching  . Other Shortness Of Breath and Other (See Comments)    Grass and weeds "sneezing; filled sinuses" (02/20/2012)  . Penicillins Rash and Other (See Comments)    "welts" (02/20/2012) Has patient had a PCN reaction causing immediate rash, facial/tongue/throat swelling, SOB or lightheadedness with hypotension: Unknown Has patient had a PCN reaction causing severe rash involving mucus membranes or skin necrosis: No Has patient had a PCN reaction that required hospitalization: No Has patient had a PCN reaction occurring within the last 10 years: No If all of the above answers are "NO", then may proceed with Cephalosporin use.   Marland Kitchen Reclast [Zoledronic Acid] Other (See Comments)    Fever, Put in hospital, dr said it was a reaction from a reaction   . Rofecoxib Swelling    Vioxx REACTION: feet swelling  . Shrimp Flavor Anaphylaxis    ALL SHELLFISH  . Tetracycline Hcl Nausea And Vomiting  . Xolair [Omalizumab] Other (See Comments)    Caused Blood clot  . Dilaudid [Hydromorphone Hcl] Itching   . Hydrocodone-Acetaminophen Nausea And Vomiting  . Levofloxacin Other (See Comments)    REACTION: GI upset  . Oxycodone Hcl Nausea And Vomiting  . Paroxetine Nausea And Vomiting    Paxil   . Celecoxib Swelling    Feet swelling  . Diltiazem Swelling  . Lactose Intolerance (Gi) Other (See Comments)    Bloating and  gas  . Tree Extract Other (See Comments)    "tested and told I was allergic to it; never experienced a reaction to it" (02/20/2012)   Prior to Admission medications   Medication Sig Start Date End Date Taking? Authorizing Provider  acetaminophen (TYLENOL) 650 MG CR tablet Take 1,300 mg by mouth 2 (two) times daily.    Yes [provider]  Alpha-D-Galactosidase (BEANO PO) Take 1 capsule by mouth 3 (three) times daily as needed (gas/ bloating).    Yes [provider]  Alpha-Lipoic Acid 600 MG CAPS Take 600 mg by mouth daily.    Yes [provider]  azelastine (ASTELIN) 0.1 % nasal spray USE 2 SPRAYS IN EACH NOSTRIL DAILY AS DIRECTED Patient taking differently: Place 2 sprays into both nostrils daily.  07/16/17  Yes Kozlow, Donnamarie Poag, MD  CALCIUM-MAGNESIUM PO Take 1 tablet by mouth at bedtime.   Yes [provider]  cetirizine (ZYRTEC) 10 MG tablet Take 5 mg by mouth every evening.    Yes [provider]  Cholecalciferol (VITAMIN D3) 10000 units TABS Take 1,000 Units by mouth daily.   Yes [provider]  CREON 24000-76000 units CPEP TAKE 1 CAPSULE BY MOUTH THREE TIMES DAILY (WITH MEALS) Patient taking differently: Take 1 capsule by mouth 3 (three) times daily with meals.  02/11/17  Yes [name], MD  cyproheptadine (PERIACTIN) 4 MG tablet TAKE 2 TABLETS BY MOUTH EVERY EVENING Patient taking differently: Take 8 mg by mouth at bedtime.  07/16/17  Yes Kozlow, Donnamarie Poag, MD  dexlansoprazole (DEXILANT) 60 MG capsule Take 1 capsule (60 mg total) by mouth daily. 07/16/17  Yes Kozlow, Donnamarie Poag, MD  diclofenac sodium (VOLTAREN) 1 % GEL  Apply 2 g topically 4 (four) times daily. Patient taking differently: Apply 2 g topically 2 (two) times daily.  12/05/17  Yes [name], MD  fluticasone (FLONASE) 50 MCG/ACT nasal spray Place 1 spray into both nostrils daily.  01/08/18  Yes [provider]  gabapentin (NEURONTIN) 300 MG capsule Take 300 mg by mouth 2 (two) times daily.   Yes [provider]  hydrocortisone (ANUSOL-HC) 2.5 % rectal cream APPLY AS NEEDED AS DIRECTED Patient taking differently: Place 1 application rectally as needed for hemorrhoids.  06/28/17  Yes [name], MD  mirtazapine (REMERON SOL-TAB) 30 MG disintegrating tablet DISSOLVE 1 TABLET IN MOUTH EVERY NIGHT AT BEDTIME Patient taking differently: Take 30 mg by mouth at bedtime.  01/10/18  Yes Gatha Mayer, MD  mometasone-formoterol (DULERA) 200-5 MCG/ACT AERO Inhale 2 puffs into the lungs 2 (two) times daily. 07/16/17  Yes Kozlow, Donnamarie Poag, MD  montelukast (SINGULAIR) 10 MG tablet Take 1 tablet (10 mg total) by mouth daily. 07/16/17  Yes Kozlow, Donnamarie Poag, MD  mupirocin ointment (BACTROBAN) 2 % APPLY IN THE NOSE 3 TIMES DAILY AS NEEDED Patient taking differently: Place 1 application into the nose at bedtime. To prevent nose bleeds 07/16/17  Yes Kozlow, Donnamarie Poag, MD  nystatin (MYCOSTATIN) 100000 UNIT/ML suspension USE AS DIRECTED 5MLS IN MOUTH OR THROAT 4 TIMES DAILY Patient taking differently: Use as directed 5 mLs in the mouth or throat at bedtime. Swish and spit 10/23/17  Yes [name], MD  omeprazole (PRILOSEC) 20 MG capsule Take 20 mg by mouth daily.  01/08/18  Yes [provider]  Prenatal Vit-Sel-Fe Fum-FA (VINATE M) 27-1 MG TABS Take 1 tablet by mouth daily. 07/26/17  Yes [name], MD  [place]  2 puffs into both ears once a week. Place capsule in dispenser and instill 2 puffs in each ear once a week after washing hair. CS (X) AHCL    CHLORAMP/SULFU/AMPHO B/ H DROC 100-100-5 mg capsule -  compounded at [place]   Yes [provider]  Probiotic Product (ALIGN) 4 MG CAPS Take 4 mg by mouth every evening.    Yes [provider]  pseudoephedrine-guaifenesin (MUCINEX D) 60-600 MG 12 hr tablet Take 1 tablet by mouth daily.    Yes [provider]  ranitidine (ZANTAC) 300 MG tablet Take 1 tablet (300 mg total) by mouth daily. Patient taking differently: Take 300 mg by mouth every evening.  07/16/17  Yes Kozlow, Donnamarie Poag, MD  rivaroxaban (XARELTO) 20 MG TABS tablet Take 1 tablet (20 mg total) by mouth daily with supper. 01/05/18  Yes Thurnell Lose, MD  SIMPLY SALINE NA Place 1 spray into both nostrils at bedtime.   Yes [provider]  Tiotropium Bromide Monohydrate (SPIRIVA RESPIMAT) 2.5 MCG/ACT AERS Inhale 2 puffs into the lungs daily. 07/16/17  Yes Kozlow, Donnamarie Poag, MD  albuterol (PROAIR HFA) 108 (90 Base) MCG/ACT inhaler Inhale 2 puffs into the lungs every 4 (four) hours as needed for wheezing or shortness of breath. 02/18/17   [name], MD  atorvastatin (LIPITOR) 40 MG tablet Take 1 tablet (40 mg total) by mouth daily. 08/19/17   [name], MD  Colchicine 0.6 MG CAPS TAKE 1 CAPSULE BY MOUTH DAILY AS NEEDED Patient not taking: No sig reported 11/25/17   [name], MD  denosumab (PROLIA) 60 MG/ML SOLN injection Inject 60 mg into the skin every 6 (six) months. Administer in upper arm, thigh, or abdomen    [provider]  EPINEPHrine (EPIPEN 2-PAK) 0.3 mg/0.3 mL IJ SOAJ injection Inject 0.3 mLs (0.3 mg total) into the muscle Once PRN for up to 1 dose. Patient taking differently: Inject 0.3 mg into the muscle once as needed (severe allergic reaction).  12/02/17   Juanito Doom, MD  feeding supplement, GLUCERNA SHAKE, (GLUCERNA SHAKE) LIQD Take 237 mLs by mouth 3 (three) times daily between meals. Patient not taking: Reported on 01/14/2018 12/23/17   Thurnell Lose, MD  Lactase (LACTAID FAST ACT) 9000 units TABS Take  18,000 Units by mouth 4 (four) times daily as needed (dairy consumption).     [provider]  lidocaine (LIDODERM) 5 % Place 1 patch onto the skin daily. Remove & Discard patch within 12 hours or as directed by MD Patient taking differently: Place 1 patch onto the skin daily as needed (pain). Remove & Discard patch within 12 hours or as directed by MD 08/21/16   [name], MD  mometasone (NASONEX) 50 MCG/ACT nasal spray Place 2 sprays into the nose 2 (two) times daily. Patient taking differently: Place 2 sprays into the nose daily.  05/22/17   Juanito Doom, MD  nystatin-triamcinolone ointment (MYCOLOG) Apply 1 application topically 3 (three) times daily as needed (yeast infection).     [provider]  Propylene Glycol-Glycerin (SOOTHE OP) Place 1 drop into both eyes 2 (two) times daily as needed (dry eyes).     [provider]  Respiratory Therapy Supplies (FLUTTER) DEVI Use as directed 10/22/14   Elsie Stain, MD  Rivaroxaban (XARELTO) 15 MG TABS tablet Take 1 tablet (15 mg total) by mouth 2 (two) times daily with a meal for 21 days. 12/15/17 01/05/18  Lala Lund  K, MD  Spacer/Aero-Holding Chambers (AEROCHAMBER MV) inhaler Use as instructed 01/23/16   Javier Glazier, MD   No results found. - pertinent xrays, CT, MRI studies were reviewed and independently interpreted  Positive ROS: All other systems have been reviewed and were otherwise negative with the exception of those mentioned in the HPI and as above.  Physical Exam: General: Alert, no acute distress Psychiatric: Patient is competent for consent with normal mood and affect Lymphatic: No axillary or cervical lymphadenopathy Cardiovascular: No pedal edema Respiratory: No cyanosis, no use of accessory musculature GI: No organomegaly, abdomen is soft and non-tender    Images:  @ENCIMAGES @  Labs:  Lab Results  Component Value Date   ESRSEDRATE 121 (H) 01/22/2018   ESRSEDRATE 138  (H) 01/16/2018   ESRSEDRATE 130 (H) 02/22/2012   CRP 6.5 (H) 01/22/2018   CRP 15.8 (H) 01/16/2018   CRP 23.8 (H) 02/22/2012   LABURIC 3.7 12/19/2017   LABURIC 3.5 02/21/2012   REPTSTATUS 01/22/2018 FINAL 01/18/2018   GRAMSTAIN  01/18/2018    RARE WBC PRESENT, PREDOMINANTLY PMN NO ORGANISMS SEEN    CULT  01/18/2018    NO GROWTH 4 DAYS INTERPRET RESULTS WITH CAUTION DUE TO LIMITED SPECIMEN VOLUME Performed at [facility], [place] [address], [place], [place] [address]    LABORGA Normal Oropharyngeal Flora 08/30/2016    Lab Results  Component Value Date   ALBUMIN 2.4 (L) 01/22/2018   ALBUMIN 2.3 (L) 01/21/2018   ALBUMIN 2.3 (L) 01/20/2018   LABURIC 3.7 12/19/2017   LABURIC 3.5 02/21/2012    Neurologic: Patient does have protective sensation bilateral lower extremities.   MUSCULOSKELETAL:   Skin: Patient has no ulcers or abnormalities of either foot or leg no venous insufficiency.  Patient's feet are neurovascular intact with no arterial or venous insufficiency.  Review of the radiographs shows stress fractures base of the fifth metatarsal bilaterally at the metaphyseal diaphyseal junction consistent with Jones fractures.  Assessment: Assessment: Metaphyseal diaphyseal stress fractures base of the fifth metatarsal bilaterally consistent with a Jones fracture.  Plan: Plan: Patient states she does not feel safe or comfortable in the postoperative shoe I will order fracture boots discussed that she should wear these while walking.  I will follow-up in the office in 4 weeks with repeat radiographs.  Patient has no weightbearing restrictions with the boots on.  Thank you for the consult and the opportunity to see Ms. Beatriz Stallion, MD [facility] [phone] 2:27 PM

## 2018-01-22 NOTE — Progress Notes (Signed)
Patient was complaining about indigestion. RN gave her maalox at 2151 with minimal relief. On call was paged. Will continue to monitor.

## 2018-01-23 LAB — CBC WITH DIFFERENTIAL/PLATELET
Abs Immature Granulocytes: 2.21 10*3/uL — ABNORMAL HIGH (ref 0.00–0.07)
Basophils Absolute: 0.1 10*3/uL (ref 0.0–0.1)
Basophils Relative: 1 %
Eosinophils Absolute: 0 10*3/uL (ref 0.0–0.5)
Eosinophils Relative: 0 %
HCT: 32.1 % — ABNORMAL LOW (ref 36.0–46.0)
Hemoglobin: 9.9 g/dL — ABNORMAL LOW (ref 12.0–15.0)
Immature Granulocytes: 12 %
Lymphocytes Relative: 13 %
Lymphs Abs: 2.4 10*3/uL (ref 0.7–4.0)
MCH: 31.6 pg (ref 26.0–34.0)
MCHC: 30.8 g/dL (ref 30.0–36.0)
MCV: 102.6 fL — ABNORMAL HIGH (ref 80.0–100.0)
Monocytes Absolute: 2 10*3/uL — ABNORMAL HIGH (ref 0.1–1.0)
Monocytes Relative: 11 %
Neutro Abs: 12 10*3/uL — ABNORMAL HIGH (ref 1.7–7.7)
Neutrophils Relative %: 63 %
Platelets: 474 10*3/uL — ABNORMAL HIGH (ref 150–400)
RBC: 3.13 MIL/uL — ABNORMAL LOW (ref 3.87–5.11)
RDW: 15.3 % (ref 11.5–15.5)
WBC: 18.8 10*3/uL — ABNORMAL HIGH (ref 4.0–10.5)
nRBC: 0.7 % — ABNORMAL HIGH (ref 0.0–0.2)

## 2018-01-23 LAB — COMPREHENSIVE METABOLIC PANEL
ALT: 234 U/L — ABNORMAL HIGH (ref 0–44)
AST: 111 U/L — ABNORMAL HIGH (ref 15–41)
Albumin: 2.5 g/dL — ABNORMAL LOW (ref 3.5–5.0)
Alkaline Phosphatase: 95 U/L (ref 38–126)
Anion gap: 9 (ref 5–15)
BUN: 35 mg/dL — ABNORMAL HIGH (ref 8–23)
CO2: 27 mmol/L (ref 22–32)
Calcium: 9.1 mg/dL (ref 8.9–10.3)
Chloride: 104 mmol/L (ref 98–111)
Creatinine, Ser: 0.82 mg/dL (ref 0.44–1.00)
GFR calc Af Amer: >60 mL/min (ref >60)
GFR calc non Af Amer: >60 mL/min (ref >60)
Glucose, Bld: 113 mg/dL — ABNORMAL HIGH (ref 70–99)
Potassium: 4.7 mmol/L (ref 3.5–5.1)
Sodium: 140 mmol/L (ref 135–145)
Total Bilirubin: 0.2 mg/dL — ABNORMAL LOW (ref 0.3–1.2)
Total Protein: 5.9 g/dL — ABNORMAL LOW (ref 6.5–8.1)

## 2018-01-23 LAB — MAGNESIUM: Magnesium: 2.1 mg/dL (ref 1.7–2.4)

## 2018-01-23 MED ORDER — MOMETASONE FUROATE 50 MCG/ACT NA SUSP: 2.0000 | Freq: Every day | NASAL | Status: DC

## 2018-01-23 MED ORDER — PREDNISONE 20 MG PO TABS: 40.0000 mg | ORAL_TABLET | Freq: Every day | ORAL | 0 refills | Status: DC

## 2018-01-23 MED ORDER — NYSTATIN 100000 UNIT/ML MT SUSP: 5.0000 mL | Freq: Every day | OROMUCOSAL | Status: DC

## 2018-01-23 MED ORDER — MUPIROCIN 2 % EX OINT: 1.0000 "application " | TOPICAL_OINTMENT | Freq: Every day | CUTANEOUS | Status: DC

## 2018-01-23 MED ORDER — ONDANSETRON HCL 4 MG PO TABS: 4.0000 mg | ORAL_TABLET | Freq: Four times a day (QID) | ORAL | 0 refills | Status: DC | PRN

## 2018-01-23 MED ORDER — TRAMADOL HCL 50 MG PO TABS: 50.0000 mg | ORAL_TABLET | Freq: Four times a day (QID) | ORAL | 0 refills | Status: DC | PRN

## 2018-01-23 MED ORDER — LIDOCAINE 5 % EX PTCH: 1.0000 | MEDICATED_PATCH | Freq: Every day | CUTANEOUS | Status: DC | PRN

## 2018-01-23 MED ORDER — DICLOFENAC SODIUM 1 % TD GEL: 2.0000 g | Freq: Two times a day (BID) | TRANSDERMAL | Status: DC

## 2018-01-23 MED ORDER — RANITIDINE HCL 300 MG PO TABS: 300.0000 mg | ORAL_TABLET | Freq: Every evening | ORAL | Status: DC

## 2018-01-23 MED ORDER — ALUM & MAG HYDROXIDE-SIMETH 200-200-20 MG/5ML PO SUSP: 15.0000 mL | ORAL | 0 refills | Status: DC | PRN

## 2018-01-23 MED ORDER — VANCOMYCIN 50 MG/ML ORAL SOLUTION: 125.0000 mg | Freq: Four times a day (QID) | ORAL | 0 refills | Status: AC

## 2018-01-23 MED FILL — VANCOMYCIN 50MG/ML ORAL SOL: 5 days supply | Qty: 60 | Fill #0

## 2018-01-23 NOTE — Progress Notes (Signed)
Physical Therapy Treatment Patient Details Name: Ebony Scott MRN: [id number] DOB: 06-Dec-1950 Today's Date: 01/23/2018    History of Present Illness pt was admitted with nausea/vomiting/diarrhea.  She was discharged to SNF earlier this month after hospitalization at [facility] and  home from [facility] 01/08/18 with HHPT/OT and home instead (2x day).  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  Assisted to bathroom, amb in hallway then practiced stairs.  Pt required [place] for balance/safety as boots inhibit ankle mvt.  Pt plans to D/C to home later today.     Follow Up Recommendations  SNF(pt declines SNF so D/C to home with family support )     Equipment Recommendations  None recommended by PT    Recommendations for Other Services       Precautions / Restrictions Precautions Precautions: Fall Precaution Comments: L hip AVN and B 5th metetarsal Fx's MRI shows slight L gluteus Minimus tear Restrictions Weight Bearing Restrictions: No RLE Weight Bearing: Weight bearing as tolerated LLE Weight Bearing: Weight bearing as tolerated Other Position/Activity Restrictions: WBAT while wearing B fracture boots    Mobility  Bed Mobility               General bed mobility comments: OOB in recliner   Transfers Overall transfer level: Needs assistance Equipment used: Rolling walker (2 wheeled) Transfers: Sit to/from Stand   Stand pivot transfers: Supervision Squat pivot transfers: Supervision     General transfer comment: <25% VC's on proper hand placement and safety with turns   Ambulation/Gait Ambulation/Gait assistance: Supervision;Min guard Gait Distance (Feet): 75  Feet Assistive device: Rolling walker (2 wheeled) Gait Pattern/deviations: Step-to pattern;Step-through pattern;Decreased stance time - left Gait velocity: decreased    General Gait Details: pt tolerated a functional distance wearing B fracture boots.     Stairs Stairs: Yes Stairs assistance: Min assist Stair Management: One rail Left;Step to pattern;Sideways Number of Stairs: 4 General stair comments: pt was able to perform 4 steps one rail but required Min Assist    Wheelchair Mobility    Modified Rankin (Stroke Patients Only)       Balance                                            Cognition Arousal/Alertness: Awake/alert Behavior During Therapy: WFL for tasks assessed/performed Overall Cognitive Status: Within Functional Limits for tasks assessed                                        Exercises      General Comments        Pertinent Vitals/Pain Pain Assessment: No/denies pain    Home Living                      Prior Function            PT Goals (current goals can now be found in the care plan section) Progress towards PT goals: Progressing toward goals  Frequency    Min 3X/week      PT Plan Discharge plan needs to be updated    Co-evaluation              AM-PAC PT "6 Clicks" Mobility   Outcome Measure  Help needed turning from your back to your side while in a flat bed without using bedrails?: A Little Help needed moving from lying on your back to sitting on the side of a flat bed without using bedrails?: A Little Help needed moving to and from a bed to a chair (including a wheelchair)?: A Little Help needed standing up from a chair using your arms (e.g., wheelchair or bedside chair)?: A Little Help needed to walk in hospital room?: A Little Help needed climbing 3-5 steps with a railing? : A Little 6 Click Score: 18    End of Session Equipment Utilized During Treatment: Gait  belt Activity Tolerance: Patient tolerated treatment well Patient left: in chair;with call bell/phone within reach Nurse Communication: Mobility status PT Visit Diagnosis: Difficulty in walking, not elsewhere classified (R26.2);Pain;Other abnormalities of gait and mobility (R26.89);Muscle weakness (generalized) (M62.81) Pain - Right/Left: Left     Time: 1340-1405 PT Time Calculation (min) (ACUTE ONLY): 25 min  Charges:  $Gait Training: 8-22 mins $Therapeutic Activity: 8-22 mins                     Rica Koyanagi  PTA Acute  Rehabilitation Services Pager      [phone] Office      [phone]

## 2018-01-23 NOTE — Care Management Important Message (Signed)
Important Message  Patient Details  Name: Ebony Scott MRN: [id number] Date of Birth: 1951-01-12   Medicare Important Message Given:  Yes    Kerin Salen 01/23/2018, 11:34 AMImportant Message  Patient Details  Name: Ebony Scott MRN: [id number] Date of Birth: 14-Mar-1950   Medicare Important Message Given:  Yes    Kerin Salen 01/23/2018, 11:34 AM

## 2018-01-23 NOTE — Care Management Note (Signed)
Case Management Note  Patient Details  Name: Ebony Scott MRN: [id number] Date of Birth: [birth year]  Subjective/Objective: Patient declines SNF recc by PT. Patient was active w/[facility] but had issues with the OT person that came prior therefore patient wanted to explore other [place] agencies-[facility]-both unable to accept. Patient has agreed to stay with [facility]-I have informed liason Cassie patient concerns with the prior OT-liason will make a note of that. Patient voiced agreement to [facility] for [place]. Patient states her Son will transport her home this evening.No further CM needs.                   Action/Plan:d/c home w/HHC.   Expected Discharge Date:  01/23/18               Expected Discharge Plan:  [name]  In-House Referral:     Discharge planning Services  CM Consult  Post Acute Care Choice:  (rw) Choice offered to:  Patient  DME Arranged:    DME Agency:     HH Arranged:  RN, PT, OT, Nurse's Aide, Social Work [employer] Agency:  [facility]  Status of Service:  Completed, signed off  If discussed at [employer], dates discussed:    Additional Comments:  Dessa Phi, RN 01/23/2018, 12:37 PM

## 2018-01-23 NOTE — Discharge Summary (Signed)
Physician Discharge Summary  NIAOMI CARTAYA UUE:[id number] DOB: 11-12-1950 DOA: 01/14/2018  PCP: [name], MD  Admit date: 01/14/2018 Discharge date: 01/23/2018  Admitted From: Home Disposition: Home  Recommendations for Outpatient Follow-up:  1. Follow up with PCP in 1week with repeat CBC/CMP 2. Follow-up with Dr. Sharol Given as an outpatient  3. Patient will benefit from outpatient evaluation by GI 4. Follow-up with ED if symptoms worsen or new appear   Home Health: Yes: PT/RN Equipment/Devices: None Discharge Condition: Stable CODE STATUS: Full Diet recommendation: Heart Healthy  Brief/Interim Summary: 67 year old female with history of allergic rhinitis, seasonal allergies, asthma, anemia, anxiety, depression, also arthritis, pseudogout, breast cancer in remission, cataracts, chronic lower back pain, chronic neck pain, bilateral DVTs, diverticulosis, fibromyalgia, GERD, Graves' disease, hyperthyroidism, IBS, osteoporosis, pneumonia who was recently admitted to [facility] for acute PE and DVT and was started on anticoagulation and was discharged to skilled nursing facility presented with worsening GI symptoms with nausea, vomiting and diarrhea.  She was started on oral vancomycin for positive C. difficile antigen.  She had significant left hip pain which is thought to be pseudogout and was started on steroids.  Her condition has improved.  She will be discharged home on oral vancomycin and steroids.  Discharge Diagnoses:  Principal Problem:   Clostridium difficile colitis Active Problems:   Hyperthyroidism   GERD   Pseudogout   Chronic diastolic CHF (congestive heart failure) (HCC)   CKD (chronic kidney disease), stage III (HCC)   Debility   Clostridium difficile diarrhea   DVT (deep venous thrombosis) (HCC)   Depression   Closed fracture of fifth metatarsal bone of right foot, initial encounter   Fracture of base of fifth metatarsal bone with routine healing, left  C. difficile  colitis -Diarrhea improving.    Currently on oral vancomycin.  Finish 14-day course of oral vancomycin -Currently afebrile. -Discharge patient home  Left hip pain most likely due to acute pseudogout -X-ray/CT scan of the pelvis was negative for acute abnormality.  MRI showed small joint effusion and also partial tear of the gluteus minimus with edema.  These findings were discussed with orthopedic surgeon Dr. Erlinda Hong by prior hospitalist who recommended arthrocentesis.  Patient underwent arthrocentesis on 01/01/2018 however only had minimal amount of clear fluid yellow fluid was aspirated and it was not enough for cell counts or crystal examination.  Cultures have been negative so far.  Patient had extremely elevated CRP at 15.8.  CRP improving  at 6.5 on 01/22/2018. -Patient was empirically started on intravenous Solu-Medrol.    It has been switched to oral prednisone from today.  She feels better.  Will discharge on oral prednisone 40 mg daily for 7 days.  Outpatient follow-up with PCP and/or orthopedics -PT recommended SNF but patient refused.  Patient will need home health upon discharge.  Bilateral fifth metatarsal closed fracture -Orthopedic evaluation by Dr. Sharol Given appreciated.  Patient will need to wear fracture boots as per Dr. Sharol Given.  Outpatient follow-up with Dr. Sharol Given.    Abnormal LFTs -Improving -LFTs were elevated in November 25, 2017 as well for which right upper quadrant ultrasound/CT abdomen was negative for any acute findings.  She is status post cholecystectomy.  No worsening abdominal pain -Off Singulair and statin -Outpatient follow-up  History of chronic diastolic CHF -Compensated.  Of IV fluids.    Outpatient follow-up  Chronic kidney disease stage III -Stable.    Outpatient follow-up  Generalized deconditioning -Continue PT eval.  Patient is declining SNF placement.  Discharge home with home health.  Macrocytic anemia -Hemoglobin stable.    Outpatient  follow-up  History of acute PE and DVT diagnosed recently -Continue Xarelto  Depression -Continue Remeron  Hyperthyroidism -No longer on methimazole.  TSH was 5 in October 2019.  Outpatient monitor  Severe protein calorie malnutrition -Outpatient follow-up  Discharge Instructions  Discharge Instructions    Ambulatory referral to Gastroenterology   Complete by:  As directed    C diff colitis/Abdominal pain   Call MD for:  difficulty breathing, headache or visual disturbances   Complete by:  As directed    Call MD for:  extreme fatigue   Complete by:  As directed    Call MD for:  hives   Complete by:  As directed    Call MD for:  persistant dizziness or light-headedness   Complete by:  As directed    Call MD for:  persistant nausea and vomiting   Complete by:  As directed    Call MD for:  severe uncontrolled pain   Complete by:  As directed    Call MD for:  temperature >100.4   Complete by:  As directed    Diet - low sodium heart healthy   Complete by:  As directed    Increase activity slowly   Complete by:  As directed      Allergies as of 01/23/2018      Reactions   Dust Mite Extract Shortness Of Breath, Other (See Comments)   "sneezing" (02/20/2012)   Molds & Smuts Shortness Of Breath   Morphine Sulfate Itching   Other Shortness Of Breath, Other (See Comments)   Grass and weeds "sneezing; filled sinuses" (02/20/2012)   Penicillins Rash, Other (See Comments)   "welts" (02/20/2012) Has patient had a PCN reaction causing immediate rash, facial/tongue/throat swelling, SOB or lightheadedness with hypotension: Unknown Has patient had a PCN reaction causing severe rash involving mucus membranes or skin necrosis: No Has patient had a PCN reaction that required hospitalization: No Has patient had a PCN reaction occurring within the last 10 years: No If all of the above answers are "NO", then may proceed with Cephalosporin use.   Reclast [zoledronic Acid] Other (See  Comments)   Fever, Put in hospital, dr said it was a reaction from a reaction    Rofecoxib Swelling   Vioxx REACTION: feet swelling   Shrimp Flavor Anaphylaxis   ALL SHELLFISH   Tetracycline Hcl Nausea And Vomiting   Xolair [omalizumab] Other (See Comments)   Caused Blood clot   Dilaudid [hydromorphone Hcl] Itching   Hydrocodone-acetaminophen Nausea And Vomiting   Levofloxacin Other (See Comments)   REACTION: GI upset   Oxycodone Hcl Nausea And Vomiting   Paroxetine Nausea And Vomiting   Paxil   Celecoxib Swelling   Feet swelling   Diltiazem Swelling   Lactose Intolerance (gi) Other (See Comments)   Bloating and gas   Tree Extract Other (See Comments)   "tested and told I was allergic to it; never experienced a reaction to it" (02/20/2012)      Medication List    STOP taking these medications   atorvastatin 40 MG tablet Commonly known as:  LIPITOR   feeding supplement (GLUCERNA SHAKE) Liqd   fluticasone 50 MCG/ACT nasal spray Commonly known as:  FLONASE   montelukast 10 MG tablet Commonly known as:  SINGULAIR   omeprazole 20 MG capsule Commonly known as:  PRILOSEC     TAKE these medications   acetaminophen 650 MG  CR tablet Commonly known as:  TYLENOL Take 1,300 mg by mouth 2 (two) times daily.   AEROCHAMBER MV inhaler Use as instructed   albuterol 108 (90 Base) MCG/ACT inhaler Commonly known as:  PROVENTIL HFA;VENTOLIN HFA Inhale 2 puffs into the lungs every 4 (four) hours as needed for wheezing or shortness of breath.   ALIGN 4 MG Caps Take 4 mg by mouth every evening.   Alpha-Lipoic Acid 600 MG Caps Take 600 mg by mouth daily.   alum & mag hydroxide-simeth 200-200-20 MG/5ML suspension Commonly known as:  MAALOX/MYLANTA Take 15 mLs by mouth every 4 (four) hours as needed for indigestion or heartburn.   azelastine 0.1 % nasal spray Commonly known as:  ASTELIN USE 2 SPRAYS IN EACH NOSTRIL DAILY AS DIRECTED What changed:    how much to take  how to  take this  when to take this  additional instructions   BEANO PO Take 1 capsule by mouth 3 (three) times daily as needed (gas/ bloating).   CALCIUM-MAGNESIUM PO Take 1 tablet by mouth at bedtime.   cetirizine 10 MG tablet Commonly known as:  ZYRTEC Take 5 mg by mouth every evening.   Colchicine 0.6 MG Caps TAKE 1 CAPSULE BY MOUTH DAILY AS NEEDED   CREON 24000-76000 units Cpep Generic drug:  Pancrelipase (Lip-Prot-Amyl) TAKE 1 CAPSULE BY MOUTH THREE TIMES DAILY (WITH MEALS) What changed:  See the new instructions.   cyproheptadine 4 MG tablet Commonly known as:  PERIACTIN TAKE 2 TABLETS BY MOUTH EVERY EVENING What changed:    how much to take  how to take this  when to take this  additional instructions   denosumab 60 MG/ML Soln injection Commonly known as:  PROLIA Inject 60 mg into the skin every 6 (six) months. Administer in upper arm, thigh, or abdomen   dexlansoprazole 60 MG capsule Commonly known as:  DEXILANT Take 1 capsule (60 mg total) by mouth daily.   diclofenac sodium 1 % Gel Commonly known as:  VOLTAREN Apply 2 g topically 2 (two) times daily.   EPINEPHrine 0.3 mg/0.3 mL Soaj injection Commonly known as:  EPI-PEN Inject 0.3 mLs (0.3 mg total) into the muscle Once PRN for up to 1 dose. What changed:    when to take this  reasons to take this   FLUTTER Devi Use as directed   gabapentin 300 MG capsule Commonly known as:  NEURONTIN Take 300 mg by mouth 2 (two) times daily.   hydrocortisone 2.5 % rectal cream Commonly known as:  ANUSOL-HC APPLY AS NEEDED AS DIRECTED What changed:  See the new instructions.   LACTAID FAST ACT 9000 units Tabs Generic drug:  Lactase Take 18,000 Units by mouth 4 (four) times daily as needed (dairy consumption).   lidocaine 5 % Commonly known as:  LIDODERM Place 1 patch onto the skin daily as needed (pain). Remove & Discard patch within 12 hours or as directed by MD   mirtazapine 30 MG disintegrating  tablet Commonly known as:  REMERON SOL-TAB DISSOLVE 1 TABLET IN MOUTH EVERY NIGHT AT BEDTIME What changed:  See the new instructions.   mometasone 50 MCG/ACT nasal spray Commonly known as:  NASONEX Place 2 sprays into the nose daily.   mometasone-formoterol 200-5 MCG/ACT Aero Commonly known as:  DULERA Inhale 2 puffs into the lungs 2 (two) times daily.   mupirocin ointment 2 % Commonly known as:  BACTROBAN Place 1 application into the nose at bedtime. To prevent nose bleeds   nystatin  100000 UNIT/ML suspension Commonly known as:  MYCOSTATIN Use as directed 5 mLs (500,000 Units total) in the mouth or throat at bedtime. Swish and spit What changed:  See the new instructions.   nystatin-triamcinolone ointment Commonly known as:  MYCOLOG Apply 1 application topically 3 (three) times daily as needed (yeast infection).   ondansetron 4 MG tablet Commonly known as:  ZOFRAN Take 1 tablet (4 mg total) by mouth every 6 (six) hours as needed for nausea.   predniSONE 20 MG tablet Commonly known as:  DELTASONE Take 2 tablets (40 mg total) by mouth daily with breakfast. Start taking on:  01/24/2018   PRESCRIPTION MEDICATION Place 2 puffs into both ears once a week. Place capsule in dispenser and instill 2 puffs in each ear once a week after washing hair. CS (X) AHCL    CHLORAMP/SULFU/AMPHO B/ H DROC 100-100-5 mg capsule - compounded at [place]   pseudoephedrine-guaifenesin 60-600 MG 12 hr tablet Commonly known as:  MUCINEX D Take 1 tablet by mouth daily.   ranitidine 300 MG tablet Commonly known as:  ZANTAC Take 1 tablet (300 mg total) by mouth every evening.   rivaroxaban 20 MG Tabs tablet Commonly known as:  XARELTO Take 1 tablet (20 mg total) by mouth daily with supper. What changed:  Another medication with the same name was removed. Continue taking this medication, and follow the directions you see here.   SIMPLY SALINE NA Place 1 spray into both nostrils at bedtime.    SOOTHE OP Place 1 drop into both eyes 2 (two) times daily as needed (dry eyes).   Tiotropium Bromide Monohydrate 2.5 MCG/ACT Aers Inhale 2 puffs into the lungs daily.   traMADol 50 MG tablet Commonly known as:  ULTRAM Take 1 tablet (50 mg total) by mouth every 6 (six) hours as needed for moderate pain.   vancomycin 50 mg/mL  oral solution Commonly known as:  VANCOCIN Take 2.5 mLs (125 mg total) by mouth 4 (four) times daily for 5 days.   VINATE M 27-1 MG Tabs Take 1 tablet by mouth daily.   Vitamin D3 250 MCG (10000 UT) Tabs Take 1,000 Units by mouth daily.      Follow-up Information    Newt Minion, MD In 4 weeks.   Specialty:  Orthopedic Surgery Contact information: [place] [address] [phone]        [name], MD. Schedule an appointment as soon as possible for a visit in 1 week(s).   Specialty:  Family Medicine Why:  With repeat CBC/BMP Contact information: [address] [place] [address] [phone]        Buford Dresser, MD .   Specialty:  Cardiology Contact information: [address] [place] [address] [phone]          Allergies  Allergen Reactions  . Dust Mite Extract Shortness Of Breath and Other (See Comments)    "sneezing" (02/20/2012)  . Molds & Smuts Shortness Of Breath  . Morphine Sulfate Itching  . Other Shortness Of Breath and Other (See Comments)    Grass and weeds "sneezing; filled sinuses" (02/20/2012)  . Penicillins Rash and Other (See Comments)    "welts" (02/20/2012) Has patient had a PCN reaction causing immediate rash, facial/tongue/throat swelling, SOB or lightheadedness with hypotension: Unknown Has patient had a PCN reaction causing severe rash involving mucus membranes or skin necrosis: No Has patient had a PCN reaction that required hospitalization: No Has  patient had a PCN reaction occurring within the last 10 years: No If all of the above  answers are "NO", then may proceed with Cephalosporin use.   Marland Kitchen Reclast [Zoledronic Acid] Other (See Comments)    Fever, Put in hospital, dr said it was a reaction from a reaction   . Rofecoxib Swelling    Vioxx REACTION: feet swelling  . Shrimp Flavor Anaphylaxis    ALL SHELLFISH  . Tetracycline Hcl Nausea And Vomiting  . Xolair [Omalizumab] Other (See Comments)    Caused Blood clot  . Dilaudid [Hydromorphone Hcl] Itching  . Hydrocodone-Acetaminophen Nausea And Vomiting  . Levofloxacin Other (See Comments)    REACTION: GI upset  . Oxycodone Hcl Nausea And Vomiting  . Paroxetine Nausea And Vomiting    Paxil   . Celecoxib Swelling    Feet swelling  . Diltiazem Swelling  . Lactose Intolerance (Gi) Other (See Comments)    Bloating and gas  . Tree Extract Other (See Comments)    "tested and told I was allergic to it; never experienced a reaction to it" (02/20/2012)    Consultations:  Orthopedics   Procedures/Studies: Dg Chest 2 View  Result Date: 01/14/2018 CLINICAL DATA:  Weakness over the last 4 days, some shortness of breath, history of breast carcinoma diagnosed in 1998 EXAM: CHEST - 2 VIEW COMPARISON:  Chest x-ray 12/17/2017 FINDINGS: No active infiltrate is seen and there is no evidence of pleural effusion. Mediastinal and hilar contours are unremarkable. Mild cardiomegaly is stable. No acute bony abnormality is seen. IMPRESSION: Stable mild cardiomegaly.  No active lung disease. Electronically Signed   By: Ivar Drape M.D.   On: 01/14/2018 17:09   Dg Arthro Hip Left  Result Date: 01/18/2018 INDICATION: Left hip joint effusion.  Fever.  Chills. EXAM: ARTHROCENTESIS OF LEFT HIP JOINT UNDER FLUOROSCOPIC GUIDANCE COMPARISON:  None. FLUOROSCOPY TIME:  Fluoroscopy Time:  12 seconds Radiation Exposure Index (if provided by the fluoroscopic device): 1 mGy Number of Acquired Spot Images: 0 COMPLICATIONS: None immediate. PROCEDURE: Informed written consent was obtained from the  patient after discussion of the risks, benefits and alternatives to treatment. The patient was placed prone on the fluoroscopy table and the left extremity was placed in a slight degree of flexion and internal rotation. The left hip was localized with fluoroscopy. The skin overlying the anterior aspect of the hip was prepped and draped in usual sterile fashion. A 22 gauge spinal needle was advanced into the hip joint at the lateral aspect of the femoral head-neck junction, after the overlying soft tissues were anesthetized with 1% lidocaine. A fluoroscopic image was saved and sent to PACs. Two drops of clear yellow fluid was aspirated from the left hip joint. The aspirated fluid was sent for laboratory evaluation ordered by referring physician. The needle was removed and a dressing was placed. FINDINGS: The image documents needle placement in the left hip joint. IMPRESSION: Successful fluoroscopic guided aspiration of the left hip. Electronically Signed   By: Marybelle Killings M.D.   On: 01/18/2018 10:31   Dg Ankle 2 Views Right  Result Date: 01/14/2018 CLINICAL DATA:  Right foot and ankle pain. EXAM: RIGHT ANKLE - 2 VIEW COMPARISON:  Ankle radiograph 06/10/2012 FINDINGS: There is no evidence of fracture or dislocation. Ankle mortise is preserved. Mild talonavicular degenerative change. Plantar calcaneal spur and Achilles tendon enthesophyte. No bony destructive change or periosteal reaction. Diffuse soft tissue edema about the ankle. Increased density anteriorly may be generalized soft tissue edema or  small effusion. IMPRESSION: 1. Diffuse soft tissue edema about the ankle. Focal soft tissue density anteriorly may be joint effusion or more generalized subcutaneous edema. 2. No acute osseous abnormality. Electronically Signed   By: Keith Rake M.D.   On: 01/14/2018 22:39   Dg Ankle Complete Left  Result Date: 01/14/2018 CLINICAL DATA:  Weakness over 4 days, left foot and ankle pain EXAM: LEFT ANKLE  COMPLETE - 3+ VIEW COMPARISON:  Left ankle films of 12/16/2016 FINDINGS: The left ankle joint appears normal. Alignment is normal. No acute abnormality is seen. Again the nonunited fracture through the base of the left fifth metatarsal is present. IMPRESSION: Negative left ankle. Ninety night and fracture through the base of the left fifth metatarsal. Electronically Signed   By: Ivar Drape M.D.   On: 01/14/2018 17:12   Ct Abdomen Pelvis W Contrast  Result Date: 01/15/2018 CLINICAL DATA:  nausea vomiting and profuse diarrhea. Stool studies were done. C. difficile antigen was positive. Toxin negative. PCR positive. GI pathogen panel pending. Patient with abdominal tenderness on examination. Her WBC is normal. She was febrile last night. EXAM: CT ABDOMEN AND PELVIS WITH CONTRAST TECHNIQUE: Multidetector CT imaging of the abdomen and pelvis was performed using the standard protocol following bolus administration of intravenous contrast. CONTRAST:  131mL OMNIPAQUE IOHEXOL 300 MG/ML SOLN, 57mL OMNIPAQUE IOHEXOL 300 MG/ML SOLN COMPARISON:  03/31/2007 FINDINGS: Lower chest: Scattered coronary calcifications. No pleural or pericardial effusion. Subpleural linear scarring or atelectasis posteriorly in both lower lobes. Hepatobiliary: No focal liver abnormality is seen. Status post cholecystectomy. No biliary dilatation. Pancreas: Unremarkable. No pancreatic ductal dilatation or surrounding inflammatory changes. Spleen: Normal in size without focal abnormality. Adrenals/Urinary Tract: Normal adrenals. No renal mass or hydronephrosis. No ureterectasis. Urinary bladder physiologically distended. Stomach/Bowel: Stomach and small bowel decompressed. Appendix surgically absent. The colon is nondilated, unremarkable. Vascular/Lymphatic: Scattered aortoiliac calcified plaque without aneurysm or stenosis. Portal vein patent. No abdominal or pelvic adenopathy. Reproductive: Status post hysterectomy. No adnexal masses. Other: No  ascites.  No free air. Musculoskeletal: Mild spondylitic changes in the lower lumbar spine. No fracture or other acute bone abnormality. IMPRESSION: 1. No acute abdominal findings. 2. Coronary and aortoiliac  atherosclerosis (ICD10-170.0). Electronically Signed   By: Lucrezia Europe M.D.   On: 01/15/2018 17:17   Mr Hip Left Wo Contrast  Result Date: 01/17/2018 CLINICAL DATA:  Left hip pain.  No trauma. EXAM: MR OF THE LEFT HIP WITHOUT CONTRAST TECHNIQUE: Multiplanar, multisequence MR imaging was performed. No intravenous contrast was administered. COMPARISON:  Left hip x-rays dated January 15, 2018. FINDINGS: Bones: Greater than right serpiginous signal changes within the left femoral heads, consistent with avascular necrosis. No significant surrounding marrow edema. No fracture or dislocation. No focal bone lesion. The visualized sacroiliac joints and symphysis pubis appear normal. Articular cartilage and labrum Articular cartilage: No focal chondral defect or subchondral signal abnormality identified. Labrum: There is no gross labral tear or paralabral abnormality. Joint or bursal effusion Joint effusion: Small left hip joint effusion. No right hip joint effusion. Bursae: No focal periarticular fluid collection. Muscles and tendons Muscles and tendons: Partial tear of the left gluteus minimus tendon. Mild bilateral gluteus medius tendinosis. Moderate bilateral hamstring tendinosis. The iliopsoas tendons are unremarkable. Prominent edema within the left gluteus minimus muscle. Mild edema within the bilateral adductor musculature and inferior left gluteus medius muscle. No muscle atrophy. Other findings Miscellaneous: The visualized internal pelvic contents appear unremarkable. IMPRESSION: 1. 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 2. Small left hip joint effusion, nonspecific. This could be reactive to the femoral head avascular necrosis or, given  chondrocalcinosis seen on CT, may reflect underlying CPPD arthropathy. If there is clinical concern for septic arthritis, arthrocentesis can be performed. 3. Partial tear of the left gluteus minimus tendon with significant muscle strain. 4. Mild bilateral gluteus medius and moderate bilateral hamstring tendinosis. Electronically Signed   By: Titus Dubin M.D.   On: 01/17/2018 13:19   Dg Chest Port 1 View  Result Date: 01/17/2018 CLINICAL DATA:  Cough. EXAM: PORTABLE CHEST 1 VIEW COMPARISON:  Chest x-ray dated January 14, 2018. FINDINGS: The heart size and mediastinal contours are within normal limits. Normal pulmonary vascularity. No focal consolidation, pleural effusion, or pneumothorax. No acute osseous abnormality. Unchanged surgical clips in the left breast. IMPRESSION: No active disease. Electronically Signed   By: Titus Dubin M.D.   On: 01/17/2018 09:55   Dg Foot 2 Views Right  Result Date: 01/14/2018 CLINICAL DATA:  Right foot and ankle pain. EXAM: RIGHT FOOT - 2 VIEW COMPARISON:  Foot radiograph 02/20/2012 FINDINGS: Suspected incomplete transverse fracture of the proximal fifth metatarsal about the lateral cortex. No other fracture. Small osseous density about delay lateral cuboid is likely an accessory ossicle. Minimal hallux valgus, alignment is otherwise maintained. No periosteal reaction or bony destructive change. Plantar calcaneal spur and Achilles tendon enthesophyte. Diffuse soft tissue edema. IMPRESSION: Suspected incomplete transverse fracture of the proximal fifth metatarsal shaft. Electronically Signed   By: Keith Rake M.D.   On: 01/14/2018 22:42   Dg Foot Complete Left  Result Date: 01/14/2018 CLINICAL DATA:  Weakness over the last 4 days, left foot and ankle pain, history of breast carcinoma diagnosed in 1998 EXAM: LEFT FOOT - COMPLETE 3+ VIEW COMPARISON:  Left foot films of 11/06/2016 FINDINGS: There is incomplete healing of the previously noted transverse fracture  through the base of the left fifth metatarsal. The fracture line is still somewhat visible. No acute fracture is seen. Alignment is normal. No erosion is seen. IMPRESSION: Incomplete healing of the previously diagnosed transverse fracture through the base of the left fifth metatarsal. Electronically Signed   By: Ivar Drape M.D.   On: 01/14/2018 17:10   Dg Hip Port Unilat With Pelvis 1v Left  Result Date: 01/15/2018 CLINICAL DATA:  Left hip pain EXAM: DG HIP (WITH OR WITHOUT PELVIS) 1V PORT LEFT COMPARISON:  None. FINDINGS: Early spurring in the hip joints bilaterally. SI joints are symmetric and unremarkable. No acute bony abnormality. Specifically, no fracture, subluxation, or dislocation. IMPRESSION: Early spurring in the hip joints bilaterally. No acute bony abnormality. Electronically Signed   By: Rolm Baptise M.D.   On: 01/15/2018 11:07    Left hip arthrocentesis on 01/01/2018   Subjective: Patient seen and examined at bedside.  She feels much better and thinks that she is ready to go home today.  Her diarrhea is improving.  No worsening abdominal pain or fever.  Discharge Exam: Vitals:   01/23/18 0451 01/23/18 0835  BP: (!) 163/71   Pulse: 71   Resp: 18   Temp: 98 F (36.7 C)   SpO2: 97% 93%   Vitals:   01/22/18 1440 01/22/18 2023 01/23/18 0451 01/23/18 0835  BP: (!) 166/73 136/79 (!) 163/71   Pulse: 74 80 71   Resp: 16 18 18    Temp: 98 F (36.7 C) 98 F (36.7 C) 98 F (36.7 C)   TempSrc: Oral Oral Oral   SpO2:  99% 96% 97% 93%  Weight:      Height:        General: Pt is alert, awake, not in acute distress Cardiovascular: rate controlled, S1/S2 + Respiratory: bilateral decreased breath sounds at bases Abdominal: Soft, NT, ND, bowel sounds + Extremities: no edema, no cyanosis    The results of significant diagnostics from this hospitalization (including imaging, microbiology, ancillary and laboratory) are listed below for reference.     Microbiology: Recent  Results (from the past 240 hour(s))  Blood Culture (routine x 2)     Status: None   Collection Time: 01/14/18  5:07 PM  Result Value Ref Range Status   Specimen Description   Final    LEFT ANTECUBITAL Blood Culture results may not be optimal due to an excessive volume of blood received in culture bottles BOTTLES DRAWN AEROBIC ONLY Performed at [facility] [address], [place], [place] [address]    Special Requests   Final    NONE Performed at [facility], [place] [address] [place], [place] [address]    Culture   Final    NO GROWTH 5 DAYS Performed at [facility], [place] [address] [place], [place] [address]    Report Status 01/19/2018 FINAL  Final  Blood Culture (routine x 2)     Status: None   Collection Time: 01/14/18  5:17 PM  Result Value Ref Range Status   Specimen Description   Final    RIGHT ANTECUBITAL Blood Culture results may not be optimal due to an excessive volume of blood received in culture bottles BOTTLES DRAWN AEROBIC AND ANAEROBIC Performed at [facility] [address], [place], [place] [address]    Special Requests   Final    NONE Performed at [facility], [place] [address], [place], [place] [address]    Culture   Final    NO GROWTH 5 DAYS Performed at [facility], [place] [address], [place], [place] [address]    Report Status 01/19/2018 FINAL  Final  Urine culture     Status: Abnormal   Collection Time: 01/14/18  6:45 PM  Result Value Ref Range Status   Specimen Description   Final    URINE, CLEAN CATCH Performed at [facility], [place] [address], [place], [place] [address]    Special Requests   Final    NONE Performed at [facility], [place] [address] [place],  [address]    Culture MULTIPLE SPECIES PRESENT, SUGGEST RECOLLECTION (A)  Final   Report Status 01/16/2018 FINAL  Final  Gastrointestinal Panel by PCR , Stool      Status: None   Collection Time: 01/14/18  7:03 PM  Result Value Ref Range Status   Campylobacter species NOT DETECTED NOT DETECTED Final   Plesimonas shigelloides NOT DETECTED NOT DETECTED Final   Salmonella species NOT DETECTED NOT DETECTED Final   Yersinia enterocolitica NOT DETECTED NOT DETECTED Final   Vibrio species NOT DETECTED NOT DETECTED Final   Vibrio cholerae NOT DETECTED NOT DETECTED Final   Enteroaggregative E coli (EAEC) NOT DETECTED NOT DETECTED Final   Enteropathogenic E coli (EPEC) NOT DETECTED NOT DETECTED Final   Enterotoxigenic E coli (ETEC) NOT DETECTED NOT DETECTED Final   Shiga like toxin producing E coli (STEC) NOT DETECTED NOT DETECTED Final   Shigella/Enteroinvasive E coli (EIEC) NOT DETECTED NOT DETECTED Final   Cryptosporidium NOT DETECTED NOT DETECTED Final  Cyclospora cayetanensis NOT DETECTED NOT DETECTED Final   Entamoeba histolytica NOT DETECTED NOT DETECTED Final   Giardia lamblia NOT DETECTED NOT DETECTED Final   Adenovirus F40/41 NOT DETECTED NOT DETECTED Final   Astrovirus NOT DETECTED NOT DETECTED Final   Norovirus GI/GII NOT DETECTED NOT DETECTED Final   Rotavirus A NOT DETECTED NOT DETECTED Final   Sapovirus (I, II, IV, and V) NOT DETECTED NOT DETECTED Final    Comment: Performed at [facility], [place]., [place], [place] [address]  C difficile quick scan w PCR reflex     Status: Abnormal   Collection Time: 01/14/18  7:03 PM  Result Value Ref Range Status   C Diff antigen POSITIVE (A) NEGATIVE Final   C Diff toxin NEGATIVE NEGATIVE Final   C Diff interpretation Results are indeterminate. See PCR results.  Final    Comment: Performed at [facility], [place] [address] [place], [place] [address]  C. Diff by PCR, Reflexed     Status: Abnormal   Collection Time: 01/14/18  7:03 PM  Result Value Ref Range Status   Toxigenic C. Difficile by PCR POSITIVE (A) NEGATIVE Final    Comment: Positive for toxigenic C.  difficile with little to no toxin production. Only treat if clinical presentation suggests symptomatic illness. Performed at [facility], [place] [address] [place], [place] [address]   Culture, blood (Routine X 2) w Reflex to ID Panel     Status: None   Collection Time: 01/16/18  2:35 PM  Result Value Ref Range Status   Specimen Description   Final    BLOOD RIGHT ANTECUBITAL Performed at [facility] [address] [place], [place] [address]    Special Requests   Final    BOTTLES DRAWN AEROBIC AND ANAEROBIC Blood Culture adequate volume Performed at [facility] [address], [place], [place] [address]    Culture   Final    NO GROWTH 5 DAYS Performed at [facility], [place] [address], [place], [place] [address]    Report Status 01/21/2018 FINAL  Final  Culture, blood (Routine X 2) w Reflex to ID Panel     Status: None   Collection Time: 01/16/18  2:40 PM  Result Value Ref Range Status   Specimen Description   Final    BLOOD RIGHT ARM Performed at [facility] [address], [place], [place] [address]    Special Requests   Final    BOTTLES DRAWN AEROBIC AND ANAEROBIC Blood Culture adequate volume Performed at [place] [address] [place], [place] [address]    Culture   Final    NO GROWTH 5 DAYS Performed at [facility], [place] [address] [place],  [address]    Report Status 01/21/2018 FINAL  Final  Body fluid culture     Status: None   Collection Time: 01/18/18  8:50 AM  Result Value Ref Range Status   Specimen Description   Final    HIP LEFT Performed at [facility] [address], [place],  [address]    Special Requests   Final    LESS THAN ONE DROP OF FLUID GRAM AND CHOC PLATE ONLY   Gram Stain   Final    RARE WBC PRESENT, PREDOMINANTLY PMN NO ORGANISMS SEEN    Culture   Final    NO GROWTH 4 DAYS INTERPRET RESULTS WITH CAUTION  DUE  TO LIMITED SPECIMEN VOLUME Performed at [facility], [place] [address], [place], [place] [address]    Report Status 01/22/2018 FINAL  Final     Labs: BNP (last 3 results) Recent Labs    12/13/17 1051 12/21/17 0237  BNP 60.6 366.4*   Basic Metabolic Panel: Recent Labs  Lab 01/17/18 0501 01/20/18 0406 01/21/18 0452 01/22/18 0413 01/23/18 0420  NA 138 141 142 141 140  K 4.5 4.2 4.2 4.7 4.7  CL 107 109 109 104 104  CO2 21* 20* 24 27 27   GLUCOSE 96 159* 159* 122* 113*  BUN 13 28* 31* 34* 35*  CREATININE 0.85 0.81 0.84 0.91 0.82  CALCIUM 8.5* 9.7 9.2 9.3 9.1  MG  --   --   --   --  2.1   Liver Function Tests: Recent Labs  Lab 01/17/18 0501 01/20/18 0406 01/21/18 0452 01/22/18 0413 01/23/18 0420  AST 42* 116* 379* 204* 111*  ALT 48* 100* 305* 290* 234*  ALKPHOS 64 87 92 91 95  BILITOT 0.8 0.4 0.3 0.6 0.2*  PROT 6.2* 6.7 6.0* 6.2* 5.9*  ALBUMIN 2.5* 2.3* 2.3* 2.4* 2.5*   No results for input(s): LIPASE, AMYLASE in the last 168 hours. No results for input(s): AMMONIA in the last 168 hours. CBC: Recent Labs  Lab 01/17/18 0501 01/18/18 0524 01/20/18 0406 01/22/18 0413 01/23/18 0420  WBC 9.9 10.0 7.6 16.3* 18.8*  NEUTROABS  --   --   --   --  12.0*  HGB 9.3* 9.5* 9.8* 9.9* 9.9*  HCT 30.4* 30.5* 31.6* 31.6* 32.1*  MCV 104.8* 103.4* 100.6* 102.6* 102.6*  PLT 274 314 411* 467* 474*   Cardiac Enzymes: No results for input(s): CKTOTAL, CKMB, CKMBINDEX, TROPONINI in the last 168 hours. BNP: Invalid input(s): POCBNP CBG: No results for input(s): GLUCAP in the last 168 hours. D-Dimer No results for input(s): DDIMER in the last 72 hours. Hgb A1c No results for input(s): HGBA1C in the last 72 hours. Lipid Profile No results for input(s): CHOL, HDL, LDLCALC, TRIG, CHOLHDL, LDLDIRECT in the last 72 hours. Thyroid function studies No results for input(s): TSH, T4TOTAL, T3FREE, THYROIDAB in the last 72 hours.  Invalid input(s): FREET3 Anemia work up No  results for input(s): VITAMINB12, FOLATE, FERRITIN, TIBC, IRON, RETICCTPCT in the last 72 hours. Urinalysis    Component Value Date/Time   COLORURINE YELLOW 01/14/2018 1845   APPEARANCEUR HAZY (A) 01/14/2018 1845   LABSPEC 1.014 01/14/2018 1845   PHURINE 6.0 01/14/2018 1845   GLUCOSEU NEGATIVE 01/14/2018 1845   HGBUR NEGATIVE 01/14/2018 1845   BILIRUBINUR NEGATIVE 01/14/2018 1845   KETONESUR NEGATIVE 01/14/2018 1845   PROTEINUR 30 (A) 01/14/2018 1845   UROBILINOGEN 0.2 05/17/2014 1113   NITRITE NEGATIVE 01/14/2018 1845   LEUKOCYTESUR TRACE (A) 01/14/2018 1845   Sepsis Labs Invalid input(s): PROCALCITONIN,  WBC,  LACTICIDVEN Microbiology Recent Results (from the past 240 hour(s))  Blood Culture (routine x 2)     Status: None   Collection Time: 01/14/18  5:07 PM  Result Value Ref Range Status   Specimen Description   Final    LEFT ANTECUBITAL Blood Culture results may not be optimal due to an excessive volume of blood received in culture bottles BOTTLES DRAWN AEROBIC ONLY Performed at [facility], [place] [address] [place], [place] [address]    Special Requests   Final    NONE Performed at [facility], [place] [address], [place], [place] [address]  Culture   Final    NO GROWTH 5 DAYS Performed at [facility], [place] [address] [place], [place] [address]    Report Status 01/19/2018 FINAL  Final  Blood Culture (routine x 2)     Status: None   Collection Time: 01/14/18  5:17 PM  Result Value Ref Range Status   Specimen Description   Final    RIGHT ANTECUBITAL Blood Culture results may not be optimal due to an excessive volume of blood received in culture bottles BOTTLES DRAWN AEROBIC AND ANAEROBIC Performed at [place] [address], [place], [place] [address]    Special Requests   Final    NONE Performed at [facility], [place] [address] [place], [place] [address]    Culture   Final    NO  GROWTH 5 DAYS Performed at [facility], [place] [address], [place], [place] [address]    Report Status 01/19/2018 FINAL  Final  Urine culture     Status: Abnormal   Collection Time: 01/14/18  6:45 PM  Result Value Ref Range Status   Specimen Description   Final    URINE, CLEAN CATCH Performed at [facility], [place] [address] [place], [place] [address]    Special Requests   Final    NONE Performed at [facility], [place] [address], [place], [place] [address]    Culture MULTIPLE SPECIES PRESENT, SUGGEST RECOLLECTION (A)  Final   Report Status 01/16/2018 FINAL  Final  Gastrointestinal Panel by PCR , Stool     Status: None   Collection Time: 01/14/18  7:03 PM  Result Value Ref Range Status   Campylobacter species NOT DETECTED NOT DETECTED Final   Plesimonas shigelloides NOT DETECTED NOT DETECTED Final   Salmonella species NOT DETECTED NOT DETECTED Final   Yersinia enterocolitica NOT DETECTED NOT DETECTED Final   Vibrio species NOT DETECTED NOT DETECTED Final   Vibrio cholerae NOT DETECTED NOT DETECTED Final   Enteroaggregative E coli (EAEC) NOT DETECTED NOT DETECTED Final   Enteropathogenic E coli (EPEC) NOT DETECTED NOT DETECTED Final   Enterotoxigenic E coli (ETEC) NOT DETECTED NOT DETECTED Final   Shiga like toxin producing E coli (STEC) NOT DETECTED NOT DETECTED Final   Shigella/Enteroinvasive E coli (EIEC) NOT DETECTED NOT DETECTED Final   Cryptosporidium NOT DETECTED NOT DETECTED Final   Cyclospora cayetanensis NOT DETECTED NOT DETECTED Final   Entamoeba histolytica NOT DETECTED NOT DETECTED Final   Giardia lamblia NOT DETECTED NOT DETECTED Final   Adenovirus F40/41 NOT DETECTED NOT DETECTED Final   Astrovirus NOT DETECTED NOT DETECTED Final   Norovirus GI/GII NOT DETECTED NOT DETECTED Final   Rotavirus A NOT DETECTED NOT DETECTED Final   Sapovirus (I, II, IV, and V) NOT DETECTED NOT DETECTED Final    Comment: Performed at [facility], [place] [place], [place] [address]  C difficile quick scan w PCR reflex     Status: Abnormal   Collection Time: 01/14/18  7:03 PM  Result Value Ref Range Status   C Diff antigen POSITIVE (A) NEGATIVE Final   C Diff toxin NEGATIVE NEGATIVE Final   C Diff interpretation Results are indeterminate. See PCR results.  Final    Comment: Performed at [facility], [place] [address] [place], [place] [address]  C. Diff by PCR, Reflexed     Status: Abnormal   Collection Time: 01/14/18  7:03 PM  Result Value  Ref Range Status   Toxigenic C. Difficile by PCR POSITIVE (A) NEGATIVE Final    Comment: Positive for toxigenic C. difficile with little to no toxin production. Only treat if clinical presentation suggests symptomatic illness. Performed at [facility], [place] [address], [place], [place] [address]   Culture, blood (Routine X 2) w Reflex to ID Panel     Status: None   Collection Time: 01/16/18  2:35 PM  Result Value Ref Range Status   Specimen Description   Final    BLOOD RIGHT ANTECUBITAL Performed at [place] [address] [place], [place] [address]    Special Requests   Final    BOTTLES DRAWN AEROBIC AND ANAEROBIC Blood Culture adequate volume Performed at [place] [address], [place], [place] [address]    Culture   Final    NO GROWTH 5 DAYS Performed at [facility], [place] [address], [place], [place] [address]    Report Status 01/21/2018 FINAL  Final  Culture, blood (Routine X 2) w Reflex to ID Panel     Status: None   Collection Time: 01/16/18  2:40 PM  Result Value Ref Range Status   Specimen Description   Final    BLOOD RIGHT ARM Performed at [place] [address] [place], [place] [address]    Special Requests   Final    BOTTLES DRAWN AEROBIC AND ANAEROBIC Blood Culture adequate volume Performed at [place] [address] [place], [place] [address]    Culture   Final    NO GROWTH 5 DAYS Performed at [facility], [place] [address] [place], [place] [address]    Report Status 01/21/2018 FINAL  Final  Body fluid culture     Status: None   Collection Time: 01/18/18  8:50 AM  Result Value Ref Range Status   Specimen Description   Final    HIP LEFT Performed at [place] [address] [place], [place] [address]    Special Requests   Final    LESS THAN ONE DROP OF FLUID GRAM AND CHOC PLATE ONLY   Gram Stain   Final    RARE WBC PRESENT, PREDOMINANTLY PMN NO ORGANISMS SEEN    Culture   Final    NO GROWTH 4 DAYS INTERPRET RESULTS WITH CAUTION DUE TO LIMITED SPECIMEN VOLUME Performed at [facility], [place] [address], [place], [place] [address]    Report Status 01/22/2018 FINAL  Final     Time coordinating discharge: 35 minutes  SIGNED:   Aline August, MD  [facility] 01/23/2018, 11:22 AM Pager: [phone]  If 7PM-7AM, please contact night-coverage [email or web address] Password [email or web address]

## 2018-01-24 ENCOUNTER — Encounter: Payer: Self-pay | Admitting: Family Medicine

## 2018-01-24 ENCOUNTER — Ambulatory Visit (INDEPENDENT_AMBULATORY_CARE_PROVIDER_SITE_OTHER): Payer: Medicare Other | Admitting: Family Medicine

## 2018-01-24 VITALS — BP 132/60 | HR 76 | Temp 97.9°F | Resp 16 | Ht 62.0 in | Wt 120.0 lb

## 2018-01-24 DIAGNOSIS — R945 Abnormal results of liver function studies: Secondary | ICD-10-CM | POA: Diagnosis not present

## 2018-01-24 DIAGNOSIS — Z09 Encounter for follow-up examination after completed treatment for conditions other than malignant neoplasm: Secondary | ICD-10-CM

## 2018-01-24 DIAGNOSIS — A0472 Enterocolitis due to Clostridium difficile, not specified as recurrent: Secondary | ICD-10-CM | POA: Diagnosis not present

## 2018-01-24 DIAGNOSIS — R7989 Other specified abnormal findings of blood chemistry: Secondary | ICD-10-CM

## 2018-01-24 MED ORDER — SACCHAROMYCES BOULARDII 250 MG PO CAPS: 250.0000 mg | ORAL_CAPSULE | Freq: Two times a day (BID) | ORAL | 2 refills | Status: DC

## 2018-01-24 NOTE — Progress Notes (Signed)
Subjective:    Patient ID: Ebony Scott, female    DOB: [birth year], 67 y.o.   MRN: [id number]  HPI  She was recently admitted to the hospital having been found with altered mental status lying in the floor for several days.  She was found to have a RLE DVT and  Small bilateral PE's was also found to be in rhabdomyolysis.  His discharge from the hospital on November 4 to a skilled nursing facility.  She was recently readmitted on November 26 with nausea vomiting and diarrhea and was found to have C. difficile colitis there was presumed to be acquired in the nursing home.  I have copied relevant portions of the discharge summary below and included them for my reference: Admit date: 01/14/2018 Discharge date: 01/23/2018  Admitted From: Home Disposition: Home  Recommendations for Outpatient Follow-up:  1. Follow up with PCP in 1week with repeat CBC/CMP 2. Follow-up with Dr. Sharol Given as an outpatient  3. Patient will benefit from outpatient evaluation by GI 4. Follow-up with ED if symptoms worsen or new appear   Home Health: Yes: PT/RN Equipment/Devices: None Discharge Condition: Stable CODE STATUS: Full Diet recommendation: Heart Healthy  Brief/Interim Summary: 67 year old female with history of allergic rhinitis, seasonal allergies, asthma, anemia, anxiety, depression, also arthritis, pseudogout, breast cancer in remission, cataracts, chronic lower back pain, chronic neck pain, bilateral DVTs, diverticulosis, fibromyalgia, GERD, Graves' disease, hyperthyroidism, IBS, osteoporosis, pneumonia who was recently admitted to [facility] for acute PE and DVT and was started on anticoagulation and was discharged to skilled nursing facility presented with worsening GI symptoms with nausea, vomiting and diarrhea. She was started on oral vancomycin for positive C. difficile antigen. She had significant left hip pain which is thought to be pseudogout and was started on steroids.  Her condition has improved.   She will be discharged home on oral vancomycin and steroids.  Discharge Diagnoses:  Principal Problem:   Clostridium difficile colitis Active Problems:   Hyperthyroidism   GERD   Pseudogout   Chronic diastolic CHF (congestive heart failure) (HCC)   CKD (chronic kidney disease), stage III (HCC)   Debility   Clostridium difficile diarrhea   DVT (deep venous thrombosis) (HCC)   Depression   Closed fracture of fifth metatarsal bone of right foot, initial encounter   Fracture of base of fifth metatarsal bone with routine healing, left  C. difficile colitis -Diarrhea improving.   Currently on oral vancomycin.  Finish 14-day course of oral vancomycin -Currently afebrile. -Discharge patient home  Left hip pain most likely due to acute pseudogout -X-ray/CT scan of the pelvis was negative for acute abnormality. MRI showed small joint effusion and also partial tear of the gluteus minimus with edema. These findings were discussed with orthopedic surgeon Dr. Cheri Fowler prior hospitalist who recommended arthrocentesis. Patient underwent arthrocentesis on 01/01/2018 however only had minimal amount of clear fluid yellow fluid was aspirated and it was not enough for cell counts or crystal examination. Cultures have been negative so far. Patient had extremely elevated CRP at 15.8. CRP improving  at 6.5 on 01/22/2018. -Patient was empirically started on intravenous Solu-Medrol.   It has been switched to oral prednisone from today.  She feels better.  Will discharge on oral prednisone 40 mg daily for 7 days.  Outpatient follow-up with PCP and/or orthopedics -PT recommended SNF but patient refused.  Patient will need home health upon discharge.  Bilateral fifth metatarsal closed fracture -Orthopedic evaluation by Dr. Sharol Given appreciated.  Patient will need to  wear fracture boots as per Dr. Sharol Given.  Outpatient follow-up with Dr. Sharol Given.    Abnormal LFTs -Improving -LFTs were elevated in November 25, 2017 as  well for which right upper quadrant ultrasound/CT abdomen was negative for any acute findings. She is status post cholecystectomy. No worsening abdominal pain -Off Singulair and statin -Outpatient follow-up  History of chronic diastolic CHF -Compensated. Of IV fluids.   Outpatient follow-up  Chronic kidney disease stage III -Stable.   Outpatient follow-up  Generalized deconditioning -Continue PT eval. Patient is declining SNF placement.  Discharge home with home health.  Macrocytic anemia -Hemoglobin stable.   Outpatient follow-up  History of acute PE and DVT diagnosed recently -Continue Xarelto  Depression -Continue Remeron  Hyperthyroidism -No longer on methimazole. TSH was 5 in October 2019. Outpatient monitor  Severe protein calorie malnutrition -Outpatient follow-up   01/24/18 She is here today for follow-up.  She is on day 9 of 14 of vancomycin 4 times daily.  She states that she is feeling much better and the diarrhea has improved.  The pain in her left hip is also improved.  She is currently on 40 mill grams a day of prednisone for pseudogout.  I recommended decreasing the prednisone to 20 mg a day over the next 3 days and then discontinuing altogether.  She is not currently taking a probiotic and I recommended that she had Florastor.  She denies any fevers or chills.  She denies any abdominal pain.  She denies any hematochezia.  She is accompanied today by her daughter.  We had a long discussion about her polypharmacy.  The majority of her medications or over-the-counter supplements that the patient has elected to take on her own.  At present I am prescribing Remeron for depression and insomnia.  I have recommended that she stay on Xarelto indefinitely given her previous history of DVTs and now a second unprovoked DVT.  I am also prescribing gabapentin 300 mg p.o. twice daily for neuropathic pain and chronic low back pain.  She believes she benefits from this.   I recommended that she discontinue her statin as well as any Tylenol due to her elevated liver function test.  She is receiving her pulmonary medication from her pulmonologist.  She is on allergy medication through the care of her allergist.  Her only on a proton pump inhibitor as well as an H2 blocker.  Believe this was started due to her respiratory issues.  I believe the patient can discontinue these medications if she is not experiencing reflux. Past Medical History:  Diagnosis Date  . Allergic rhinitis   . Allergy    SEASONAL  . Anemia   . Anxiety    pt denies  . Arthritis    "mostly the hands" (02/20/2012)  . Asthma   . Breast cancer ([place]) 1998   in remission  . Cataract    REMOVED  . Chronic lower back pain   . Chronic neck pain   . Complication of anesthesia    "had hard time waking up from it several times" (02/20/2012)  . Depression    "some; don't take anything for it" (02/20/2012)  . Diverticulosis   . DVT (deep venous thrombosis) ([place])   . Exertional dyspnea   . Fibromyalgia 11/2011  . GERD (gastroesophageal reflux disease)   . Graves disease   . Headache(784.0)    "related to allergies; more at different times during the year" (02/20/2012)  . Hemorrhoids   . Hiatal hernia  back and neck  . Hx of adenomatous colonic polyps 04/12/2016  . Hypercholesteremia    good cholesterol is high  . Hypothyroidism   . IBS (irritable bowel syndrome)   . Moderate persistent asthma    -FeV1 72% 2011, -IgE 102 2011, CT sinus Neg 2011  . Osteoporosis    on reclast yearly  . Pneumonia 04/2011; ~ 11/2011   "double; single" (02/20/2012)   Past Surgical History:  Procedure Laterality Date  . ANTERIOR AND POSTERIOR REPAIR  1990's  . APPENDECTOMY    . BREAST LUMPECTOMY  1998   left  . CARPOMETACARPEL ([place]) FUSION OF THUMB WITH AUTOGRAFT FROM RADIUS  ~ 2009   "both thumbs" (02/20/2012)  . CATARACT EXTRACTION W/ INTRAOCULAR LENS  IMPLANT, BILATERAL  2012  . CERVICAL DISCECTOMY  10/2001    C5-C6  . CERVICAL FUSION  2003   C3-C4  . CHOLECYSTECTOMY    . COLONOSCOPY    . DEBRIDEMENT TENNIS ELBOW  ?1970's   right  . ESOPHAGOGASTRODUODENOSCOPY    . HYSTERECTOMY    . KNEE ARTHROPLASTY  ?1990's   "?right; w/cartilage repair" (02/20/2012)  . NASAL SEPTUM SURGERY  1980's  . POSTERIOR CERVICAL FUSION/FORAMINOTOMY  2004   "failed initial fusion; rewired  anterior neck" (02/20/2012)  . TONSILLECTOMY  ~ 1953  . VESICOVAGINAL FISTULA CLOSURE W/ TAH  1988  . VIDEO BRONCHOSCOPY Bilateral 08/23/2016   Procedure: VIDEO BRONCHOSCOPY WITH FLUORO;  Surgeon: Javier Glazier, MD;  Location: [facility];  Service: Cardiopulmonary;  Laterality: Bilateral;   Current Outpatient Medications on File Prior to Visit  Medication Sig Dispense Refill  . acetaminophen (TYLENOL) 650 MG CR tablet Take 1,300 mg by mouth 2 (two) times daily.     Marland Kitchen albuterol (PROAIR HFA) 108 (90 Base) MCG/ACT inhaler Inhale 2 puffs into the lungs every 4 (four) hours as needed for wheezing or shortness of breath. 18 g 5  . Alpha-D-Galactosidase (BEANO PO) Take 1 capsule by mouth 3 (three) times daily as needed (gas/ bloating).     . Alpha-Lipoic Acid 600 MG CAPS Take 600 mg by mouth daily.     Marland Kitchen alum & mag hydroxide-simeth (MAALOX/MYLANTA) 200-200-20 MG/5ML suspension Take 15 mLs by mouth every 4 (four) hours as needed for indigestion or heartburn. 355 mL 0  . azelastine (ASTELIN) 0.1 % nasal spray USE 2 SPRAYS IN EACH NOSTRIL DAILY AS DIRECTED 30 mL 5  . CALCIUM-MAGNESIUM PO Take 1 tablet by mouth at bedtime.    . cetirizine (ZYRTEC) 10 MG tablet Take 5 mg by mouth every evening.     . Cholecalciferol (VITAMIN D3) 10000 units TABS Take 1,000 Units by mouth daily.    Marland Kitchen CREON 24000-76000 units CPEP TAKE 1 CAPSULE BY MOUTH THREE TIMES DAILY (WITH MEALS) (Patient taking differently: Take 1 capsule by mouth 3 (three) times daily with meals. ) 270 each 2  . cyproheptadine (PERIACTIN) 4 MG tablet TAKE 2 TABLETS BY MOUTH EVERY EVENING  60 tablet 5  . denosumab (PROLIA) 60 MG/ML SOLN injection Inject 60 mg into the skin every 6 (six) months. Administer in upper arm, thigh, or abdomen    . dexlansoprazole (DEXILANT) 60 MG capsule Take 1 capsule (60 mg total) by mouth daily. 30 capsule 5  . diclofenac sodium (VOLTAREN) 1 % GEL Apply 2 g topically 2 (two) times daily.    Marland Kitchen EPINEPHrine (EPIPEN 2-PAK) 0.3 mg/0.3 mL IJ SOAJ injection Inject 0.3 mLs (0.3 mg total) into the muscle Once PRN for up to 1  dose. 1 Device 1  . gabapentin (NEURONTIN) 300 MG capsule Take 300 mg by mouth 2 (two) times daily.    . hydrocortisone (ANUSOL-HC) 2.5 % rectal cream APPLY AS NEEDED AS DIRECTED 30 g 5  . Lactase (LACTAID FAST ACT) 9000 units TABS Take 18,000 Units by mouth 4 (four) times daily as needed (dairy consumption).     Marland Kitchen lidocaine (LIDODERM) 5 % Place 1 patch onto the skin daily as needed (pain). Remove & Discard patch within 12 hours or as directed by MD    . mirtazapine (REMERON SOL-TAB) 30 MG disintegrating tablet DISSOLVE 1 TABLET IN MOUTH EVERY NIGHT AT BEDTIME 30 tablet 5  . mometasone (NASONEX) 50 MCG/ACT nasal spray Place 2 sprays into the nose daily.    . mometasone-formoterol (DULERA) 200-5 MCG/ACT AERO Inhale 2 puffs into the lungs 2 (two) times daily. 1 Inhaler 5  . mupirocin ointment (BACTROBAN) 2 % Place 1 application into the nose at bedtime. To prevent nose bleeds    . nystatin (MYCOSTATIN) 100000 UNIT/ML suspension Use as directed 5 mLs (500,000 Units total) in the mouth or throat at bedtime. Swish and spit    . nystatin-triamcinolone ointment (MYCOLOG) Apply 1 application topically 3 (three) times daily as needed (yeast infection).     . ondansetron (ZOFRAN) 4 MG tablet Take 1 tablet (4 mg total) by mouth every 6 (six) hours as needed for nausea. 20 tablet 0  . predniSONE (DELTASONE) 20 MG tablet Take 2 tablets (40 mg total) by mouth daily with breakfast. 14 tablet 0  . Prenatal Vit-Sel-Fe Fum-FA (VINATE M) 27-1 MG TABS Take 1  tablet by mouth daily. 90 each 3  . PRESCRIPTION MEDICATION Place 2 puffs into both ears once a week. Place capsule in dispenser and instill 2 puffs in each ear once a week after washing hair. CS (X) AHCL    CHLORAMP/SULFU/AMPHO B/ H DROC 100-100-5 mg capsule - compounded at [place]    . Probiotic Product (ALIGN) 4 MG CAPS Take 4 mg by mouth every evening.     Marland Kitchen Propylene Glycol-Glycerin (SOOTHE OP) Place 1 drop into both eyes 2 (two) times daily as needed (dry eyes).     . pseudoephedrine-guaifenesin (MUCINEX D) 60-600 MG 12 hr tablet Take 1 tablet by mouth daily.     . ranitidine (ZANTAC) 300 MG tablet Take 1 tablet (300 mg total) by mouth every evening.    Marland Kitchen Respiratory Therapy Supplies (FLUTTER) DEVI Use as directed 1 each 0  . rivaroxaban (XARELTO) 20 MG TABS tablet Take 1 tablet (20 mg total) by mouth daily with supper. 30 tablet   . SIMPLY SALINE NA Place 1 spray into both nostrils at bedtime.    Marland Kitchen Spacer/Aero-Holding Chambers (AEROCHAMBER MV) inhaler Use as instructed 1 each 0  . Tiotropium Bromide Monohydrate (SPIRIVA RESPIMAT) 2.5 MCG/ACT AERS Inhale 2 puffs into the lungs daily. 1 Inhaler 5  . vancomycin (VANCOCIN) 50 mg/mL oral solution Take 2.5 mLs (125 mg total) by mouth 4 (four) times daily for 5 days. 50 mL 0  . Colchicine 0.6 MG CAPS TAKE 1 CAPSULE BY MOUTH DAILY AS NEEDED (Patient not taking: Reported on 01/24/2018) 30 capsule 2   No current facility-administered medications on file prior to visit.    Allergies  Allergen Reactions  . Dust Mite Extract Shortness Of Breath and Other (See Comments)    "sneezing" (02/20/2012)  . Molds & Smuts Shortness Of Breath  . Morphine Sulfate Itching  . Other Shortness Of  Breath and Other (See Comments)    Grass and weeds "sneezing; filled sinuses" (02/20/2012)  . Penicillins Rash and Other (See Comments)    "welts" (02/20/2012) Has patient had a PCN reaction causing immediate rash, facial/tongue/throat swelling, SOB or lightheadedness  with hypotension: Unknown Has patient had a PCN reaction causing severe rash involving mucus membranes or skin necrosis: No Has patient had a PCN reaction that required hospitalization: No Has patient had a PCN reaction occurring within the last 10 years: No If all of the above answers are "NO", then may proceed with Cephalosporin use.   Marland Kitchen Reclast [Zoledronic Acid] Other (See Comments)    Fever, Put in hospital, dr said it was a reaction from a reaction   . Rofecoxib Swelling    Vioxx REACTION: feet swelling  . Shrimp Flavor Anaphylaxis    ALL SHELLFISH  . Tetracycline Hcl Nausea And Vomiting  . Xolair [Omalizumab] Other (See Comments)    Caused Blood clot  . Dilaudid [Hydromorphone Hcl] Itching  . Hydrocodone-Acetaminophen Nausea And Vomiting  . Levofloxacin Other (See Comments)    REACTION: GI upset  . Oxycodone Hcl Nausea And Vomiting  . Paroxetine Nausea And Vomiting    Paxil   . Celecoxib Swelling    Feet swelling  . Diltiazem Swelling  . Lactose Intolerance (Gi) Other (See Comments)    Bloating and gas  . Tree Extract Other (See Comments)    "tested and told I was allergic to it; never experienced a reaction to it" (02/20/2012)   Social History   Socioeconomic History  . Marital status: Divorced    Spouse name: Not on file  . Number of children: 2  . Years of education: college  . Highest education level: Not on file  Occupational History  . Occupation: Disabled    Comment: Retired Engineer, production: RETIRED  Social Needs  . Financial resource strain: Not on file  . Food insecurity:    Worry: Not on file    Inability: Not on file  . Transportation needs:    Medical: Not on file    Non-medical: Not on file  Tobacco Use  . Smoking status: Passive Smoke Exposure - Never Smoker  . Smokeless tobacco: Never Used  . Tobacco comment: Parents  Substance and Sexual Activity  . Alcohol use: Yes    Alcohol/week: 0.0 standard drinks     Comment: 02/20/2012 "couple glasses of wine/6 months"  . Drug use: No  . Sexual activity: Never  Lifestyle  . Physical activity:    Days per week: Not on file    Minutes per session: Not on file  . Stress: Not on file  Relationships  . Social connections:    Talks on phone: Not on file    Gets together: Not on file    Attends religious service: Not on file    Active member of club or organization: Not on file    Attends meetings of clubs or organizations: Not on file    Relationship status: Not on file  . Intimate partner violence:    Fear of current or ex partner: Not on file    Emotionally abused: Not on file    Physically abused: Not on file    Forced sexual activity: Not on file  Other Topics Concern  . Not on file  Social History Narrative   Patient lives at home alone. Patient  divorced.    Patient has her BS degree.  Right handed.   Caffeine- sometimes coffee.      [facility]:   Born in [place], [place]. She worked as a Copywriter, advertising. She has no pets currently. She does have indoor plants. Previously had mold in her home that was remediated. Carpet was removed.            Review of Systems  All other systems reviewed and are negative.      Objective:   Physical Exam  Constitutional: She appears well-developed and well-nourished.  Cardiovascular: Normal rate, regular rhythm and normal heart sounds.  Pulmonary/Chest: Effort normal and breath sounds normal. No stridor. No respiratory distress. She has no wheezes.  Abdominal: Soft. Bowel sounds are normal.  Vitals reviewed.         Assessment & Plan:  Hospital discharge follow-up  Elevated LFTs  Clostridium difficile colitis  C. difficile colitis seems to be symptomatically improving.  Complete vancomycin but add Florastor 250 mg p.o. twice daily as a probiotic.  I recommended that she continue Xarelto 20 mg a day indefinitely.  She has a history of DVT.  Now she has a history of an unprovoked DVT  and bilateral PEs causing syncope.  Therefore I recommended that her risk of current DVT is too high and that she stay on long-term anticoagulation.  My biggest concern moving forward is her persistently elevated LFTs.  Yesterday in the emergency room, they were found to be still persistently elevated in the 100-200 range.  She has had a right upper quadrant ultrasound as well as a CT scan of the abdomen and pelvis to see no visible abnormality in the liver.  Viral hepatitis serologies have been negative.  I would like the patient to come back in 2 weeks to repeat her liver function test when she is not acutely ill.  If persistently elevated, I would recommend screen the patient for alpha-1 antitrypsin deficiency given her pulmonary issues as well as checking for autoimmune hepatitis and hemochromatosis.  If no other explanation is discovered, I would recommend a GI consultation to discuss possible biopsy.

## 2018-01-27 ENCOUNTER — Encounter: Payer: Self-pay | Admitting: Family Medicine

## 2018-01-27 ENCOUNTER — Telehealth: Payer: Self-pay | Admitting: *Deleted

## 2018-01-27 MED ORDER — RIVAROXABAN 20 MG PO TABS: 20.0000 mg | ORAL_TABLET | Freq: Every day | ORAL | 2 refills | Status: DC

## 2018-01-27 NOTE — Telephone Encounter (Signed)
Please inform patient and daughter that Periactin can sometimes cause problems with sedation and disorientation but she has been on this medication at her current dose for many years without any adverse effect.  Otherwise, there does not appear to be any other medication I am prescribing that can cause a problem as described.

## 2018-01-27 NOTE — Telephone Encounter (Signed)
Patient called and scheduled an appt with dr Neldon Mc in [place], patient asked if the nurse could call her back to discuss a bad situation that she had happen to her. Please advise [phone]

## 2018-01-27 NOTE — Telephone Encounter (Signed)
LM for patient to call the office

## 2018-01-27 NOTE — Telephone Encounter (Signed)
Spoke with patient.  She is aware and voiced understanding.

## 2018-01-27 NOTE — Telephone Encounter (Signed)
Patient called and explained that they fell and was unconscious for 3 hours and was hospitalized for 6 weeks. Patient is currently undergoing rehab to regain strength and be able to walk again. Patient explained that her daughter is worried about some of the medications she has been prescribed by Dr. Neldon Mc and is wondering if any of those medications may have caused her to be disoriented and fall? Patient has an appointment next month with Dr. Neldon Mc. Please advise.

## 2018-01-29 ENCOUNTER — Telehealth: Payer: Self-pay | Admitting: Family Medicine

## 2018-01-29 ENCOUNTER — Telehealth: Payer: Self-pay | Admitting: *Deleted

## 2018-01-29 NOTE — Telephone Encounter (Signed)
Received call from patient.   Reports that she has been taking the Gabapentin 300mg  PO BID as prescribed, but does not find it to be effective. Also states that she has completed the prednisone, but has since regressed. States that she can no longer move around with her walker.   MD please advise.

## 2018-01-29 NOTE — Telephone Encounter (Signed)
She can take 325mg  of tylenol twice a day for fever as needed  She needs to eat/drink, if not will be hospitalized again for dehydration Get the florastar to help but good bacteria back into her body  GO to ER if she does not improve

## 2018-01-29 NOTE — Telephone Encounter (Signed)
Patient aware of results and of providers recommendations

## 2018-01-29 NOTE — Telephone Encounter (Signed)
Pt called and states that she started running a fever yesterday of 101 and she still has it this morning as well and would like to know what she can take for this as Dr. Dennard Schaumann told her no tylenol (b/c LFT) or ibuprofen (on Xarelto). She did take tylenol for the last 2 days b/c of her pain and fever but knows she should not take any longer. Pt has also not eaten or drank much over the last few days as she has not felt well. She has also not started the [place] yet as she has to go to [employer] [facility] to pick that up.   Please advise.

## 2018-01-30 MED ORDER — PREDNISONE 10 MG PO TABS: 10.0000 mg | ORAL_TABLET | Freq: Every day | ORAL | 0 refills | Status: DC

## 2018-01-30 NOTE — Telephone Encounter (Signed)
Lets try low dose prednisone 10 mg a day (she has an infection Cdiff) and I don't want to make it worse unless absolutely necessary.

## 2018-01-30 NOTE — Telephone Encounter (Signed)
Call placed to patient. LMTRC.  

## 2018-01-30 NOTE — Telephone Encounter (Signed)
Call placed to patient and patient made aware.  

## 2018-01-30 NOTE — Telephone Encounter (Signed)
Come back to be seen to figure out what to do.

## 2018-01-30 NOTE — Telephone Encounter (Signed)
Call placed to patient.   States that she has no transportation at this time and cannot come to office for evaluation.   Reports that she has PT coming out on Monday, but is not sure when. States that she thinks she may be able to come on Monday, but inquired if we could extend prednisone until then.

## 2018-02-03 ENCOUNTER — Telehealth: Payer: Self-pay | Admitting: Family Medicine

## 2018-02-03 MED ORDER — RIVAROXABAN 20 MG PO TABS: 20.0000 mg | ORAL_TABLET | Freq: Every day | ORAL | 2 refills | Status: DC

## 2018-02-03 NOTE — Telephone Encounter (Signed)
Medication called/sent to requested pharmacy  

## 2018-02-03 NOTE — Telephone Encounter (Signed)
Refill on xarelto 90 day supply [facility].

## 2018-02-06 ENCOUNTER — Ambulatory Visit: Payer: Medicare Other | Admitting: Pulmonary Disease

## 2018-02-06 ENCOUNTER — Encounter: Payer: Self-pay | Admitting: Pulmonary Disease

## 2018-02-06 VITALS — BP 126/64 | HR 81 | Ht 61.0 in | Wt 121.0 lb

## 2018-02-06 DIAGNOSIS — J302 Other seasonal allergic rhinitis: Secondary | ICD-10-CM | POA: Diagnosis not present

## 2018-02-06 DIAGNOSIS — J3089 Other allergic rhinitis: Secondary | ICD-10-CM

## 2018-02-06 NOTE — Patient Instructions (Signed)
Allergic rhinitis causing vocal cord abnormalities: I would resume nasal steroid (Nasonex) in the spring prior to the onset of pollen In the event of a cough or cold use over-the-counter remedies such as Delsym for cough, phenylephrine for sinus congestion, saline rinses for sinus congestion  Asthma: You do not have this Stop Dulera  We will see you back in 6 months or sooner if needed
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
## 2018-02-07 ENCOUNTER — Encounter: Payer: Self-pay | Admitting: Family Medicine

## 2018-02-07 ENCOUNTER — Ambulatory Visit (INDEPENDENT_AMBULATORY_CARE_PROVIDER_SITE_OTHER): Payer: Medicare Other | Admitting: Family Medicine

## 2018-02-07 ENCOUNTER — Other Ambulatory Visit: Payer: Medicare Other

## 2018-02-07 VITALS — BP 130/50 | HR 86 | Temp 99.4°F | Resp 20 | Ht 62.0 in | Wt 124.0 lb

## 2018-02-07 DIAGNOSIS — R7989 Other specified abnormal findings of blood chemistry: Secondary | ICD-10-CM

## 2018-02-07 DIAGNOSIS — R945 Abnormal results of liver function studies: Secondary | ICD-10-CM

## 2018-02-07 MED ORDER — DOXYCYCLINE HYCLATE 100 MG PO TABS: 100.0000 mg | ORAL_TABLET | Freq: Two times a day (BID) | ORAL | 0 refills | Status: DC

## 2018-02-07 MED ORDER — SILVER SULFADIAZINE 1 % EX CREA: 1.0000 "application " | TOPICAL_CREAM | Freq: Every day | CUTANEOUS | 0 refills | Status: DC

## 2018-02-07 MED ORDER — RIVAROXABAN 20 MG PO TABS: 20.0000 mg | ORAL_TABLET | Freq: Every day | ORAL | 3 refills | Status: DC

## 2018-02-07 NOTE — Progress Notes (Signed)
Subjective:    Patient ID: Ebony Scott, female    DOB: 1950/07/18, 67 y.o.   MRN: [id number]  HPI  She was recently admitted to the hospital having been found with altered mental status lying in the floor for several days.  She was found to have a RLE DVT and  Small bilateral PE's was also found to be in rhabdomyolysis.  His discharge from the hospital on November 4 to a skilled nursing facility.  She was recently readmitted on November 26 with nausea vomiting and diarrhea and was found to have C. difficile colitis there was presumed to be acquired in the nursing home.  I have copied relevant portions of the discharge summary below and included them for my reference: Admit date: 01/14/2018 Discharge date: 01/23/2018  Admitted From: Home Disposition: Home  Recommendations for Outpatient Follow-up:  1. Follow up with PCP in 1week with repeat CBC/CMP 2. Follow-up with Dr. Sharol Given as an outpatient  3. Patient will benefit from outpatient evaluation by GI 4. Follow-up with ED if symptoms worsen or new appear   Home Health: Yes: PT/RN Equipment/Devices: None Discharge Condition: Stable CODE STATUS: Full Diet recommendation: Heart Healthy  Brief/Interim Summary: 67 year old female with history of allergic rhinitis, seasonal allergies, asthma, anemia, anxiety, depression, also arthritis, pseudogout, breast cancer in remission, cataracts, chronic lower back pain, chronic neck pain, bilateral DVTs, diverticulosis, fibromyalgia, GERD, Graves' disease, hyperthyroidism, IBS, osteoporosis, pneumonia who was recently admitted to [facility] for acute PE and DVT and was started on anticoagulation and was discharged to skilled nursing facility presented with worsening GI symptoms with nausea, vomiting and diarrhea. She was started on oral vancomycin for positive C. difficile antigen. She had significant left hip pain which is thought to be pseudogout and was started on steroids.  Her condition has improved.   She will be discharged home on oral vancomycin and steroids.  Discharge Diagnoses:  Principal Problem:   Clostridium difficile colitis Active Problems:   Hyperthyroidism   GERD   Pseudogout   Chronic diastolic CHF (congestive heart failure) (HCC)   CKD (chronic kidney disease), stage III (HCC)   Debility   Clostridium difficile diarrhea   DVT (deep venous thrombosis) (HCC)   Depression   Closed fracture of fifth metatarsal bone of right foot, initial encounter   Fracture of base of fifth metatarsal bone with routine healing, left  C. difficile colitis -Diarrhea improving.   Currently on oral vancomycin.  Finish 14-day course of oral vancomycin -Currently afebrile. -Discharge patient home  Left hip pain most likely due to acute pseudogout -X-ray/CT scan of the pelvis was negative for acute abnormality. MRI showed small joint effusion and also partial tear of the gluteus minimus with edema. These findings were discussed with orthopedic surgeon Dr. Cheri Fowler prior hospitalist who recommended arthrocentesis. Patient underwent arthrocentesis on 01/01/2018 however only had minimal amount of clear fluid yellow fluid was aspirated and it was not enough for cell counts or crystal examination. Cultures have been negative so far. Patient had extremely elevated CRP at 15.8. CRP improving  at 6.5 on 01/22/2018. -Patient was empirically started on intravenous Solu-Medrol.   It has been switched to oral prednisone from today.  She feels better.  Will discharge on oral prednisone 40 mg daily for 7 days.  Outpatient follow-up with PCP and/or orthopedics -PT recommended SNF but patient refused.  Patient will need home health upon discharge.  Bilateral fifth metatarsal closed fracture -Orthopedic evaluation by Dr. Sharol Given appreciated.  Patient will need to  wear fracture boots as per Dr. Sharol Given.  Outpatient follow-up with Dr. Sharol Given.    Abnormal LFTs -Improving -LFTs were elevated in November 25, 2017 as  well for which right upper quadrant ultrasound/CT abdomen was negative for any acute findings. She is status post cholecystectomy. No worsening abdominal pain -Off Singulair and statin -Outpatient follow-up  History of chronic diastolic CHF -Compensated. Of IV fluids.   Outpatient follow-up  Chronic kidney disease stage III -Stable.   Outpatient follow-up  Generalized deconditioning -Continue PT eval. Patient is declining SNF placement.  Discharge home with home health.  Macrocytic anemia -Hemoglobin stable.   Outpatient follow-up  History of acute PE and DVT diagnosed recently -Continue Xarelto  Depression -Continue Remeron  Hyperthyroidism -No longer on methimazole. TSH was 5 in October 2019. Outpatient monitor  Severe protein calorie malnutrition -Outpatient follow-up   01/24/18 She is here today for follow-up.  She is on day 9 of 14 of vancomycin 4 times daily.  She states that she is feeling much better and the diarrhea has improved.  The pain in her left hip is also improved.  She is currently on 40 mill grams a day of prednisone for pseudogout.  I recommended decreasing the prednisone to 20 mg a day over the next 3 days and then discontinuing altogether.  She is not currently taking a probiotic and I recommended that she had Florastor.  She denies any fevers or chills.  She denies any abdominal pain.  She denies any hematochezia.  She is accompanied today by her daughter.  We had a long discussion about her polypharmacy.  The majority of her medications or over-the-counter supplements that the patient has elected to take on her own.  At present I am prescribing Remeron for depression and insomnia.  I have recommended that she stay on Xarelto indefinitely given her previous history of DVTs and now a second unprovoked DVT.  I am also prescribing gabapentin 300 mg p.o. twice daily for neuropathic pain and chronic low back pain.  She believes she benefits from this.   I recommended that she discontinue her statin as well as any Tylenol due to her elevated liver function test.  She is receiving her pulmonary medication from her pulmonologist.  She is on allergy medication through the care of her allergist.  Her only on a proton pump inhibitor as well as an H2 blocker.  Believe this was started due to her respiratory issues.  I believe the patient can discontinue these medications if she is not experiencing reflux.  At that time, my plan was: C. difficile colitis seems to be symptomatically improving.  Complete vancomycin but add Florastor 250 mg p.o. twice daily as a probiotic.  I recommended that she continue Xarelto 20 mg a day indefinitely.  She has a history of DVT.  Now she has a history of an unprovoked DVT and bilateral PEs causing syncope.  Therefore I recommended that her risk of current DVT is too high and that she stay on long-term anticoagulation.  My biggest concern moving forward is her persistently elevated LFTs.  Yesterday in the emergency room, they were found to be still persistently elevated in the 100-200 range.  She has had a right upper quadrant ultrasound as well as a CT scan of the abdomen and pelvis to see no visible abnormality in the liver.  Viral hepatitis serologies have been negative.  I would like the patient to come back in 2 weeks to repeat her liver function test when  she is not acutely ill.  If persistently elevated, I would recommend screen the patient for alpha-1 antitrypsin deficiency given her pulmonary issues as well as checking for autoimmune hepatitis and hemochromatosis.  If no other explanation is discovered, I would recommend a GI consultation to discuss possible biopsy.  02/07/18 Recently suffered a skin tear to her right arm on the dorsum of her right forearm.  Over the last week she has been performing dressing changes at home with the care of in-home nursing.  She has now developed fevers as high as 101.  The dorsum of the right  arm is erythematous, hot to the touch, there is a weeping superficial ulcer on the dorsum of the right arm.  It appears to be a large skin tear that is approximately 10 x 5 cm.  It is oozing green-yellowish purulent foul-smelling material.  There is erythema spreading around the wound to a total diameter of 17 cm x 8 cm.  She appears to a developed a secondary cellulitis. Past Medical History:  Diagnosis Date  . Allergic rhinitis   . Allergy    SEASONAL  . Anemia   . Anxiety    pt denies  . Arthritis    "mostly the hands" (02/20/2012)  . Asthma   . Breast cancer ([place]) 1998   in remission  . Cataract    REMOVED  . Chronic lower back pain   . Chronic neck pain   . Complication of anesthesia    "had hard time waking up from it several times" (02/20/2012)  . Depression    "some; don't take anything for it" (02/20/2012)  . Diverticulosis   . DVT (deep venous thrombosis) ([place])   . Exertional dyspnea   . Fibromyalgia 11/2011  . GERD (gastroesophageal reflux disease)   . Graves disease   . Headache(784.0)    "related to allergies; more at different times during the year" (02/20/2012)  . Hemorrhoids   . Hiatal hernia    back and neck  . Hx of adenomatous colonic polyps 04/12/2016  . Hypercholesteremia    good cholesterol is high  . Hypothyroidism   . IBS (irritable bowel syndrome)   . Moderate persistent asthma    -FeV1 72% 2011, -IgE 102 2011, CT sinus Neg 2011  . Osteoporosis    on reclast yearly  . Pneumonia 04/2011; ~ 11/2011   "double; single" (02/20/2012)   Past Surgical History:  Procedure Laterality Date  . ANTERIOR AND POSTERIOR REPAIR  1990's  . APPENDECTOMY    . BREAST LUMPECTOMY  1998   left  . CARPOMETACARPEL ([place]) FUSION OF THUMB WITH AUTOGRAFT FROM RADIUS  ~ 2009   "both thumbs" (02/20/2012)  . CATARACT EXTRACTION W/ INTRAOCULAR LENS  IMPLANT, BILATERAL  2012  . CERVICAL DISCECTOMY  10/2001   C5-C6  . CERVICAL FUSION  2003   C3-C4  . CHOLECYSTECTOMY    . COLONOSCOPY     . DEBRIDEMENT TENNIS ELBOW  ?1970's   right  . ESOPHAGOGASTRODUODENOSCOPY    . HYSTERECTOMY    . KNEE ARTHROPLASTY  ?1990's   "?right; w/cartilage repair" (02/20/2012)  . NASAL SEPTUM SURGERY  1980's  . POSTERIOR CERVICAL FUSION/FORAMINOTOMY  2004   "failed initial fusion; rewired  anterior neck" (02/20/2012)  . TONSILLECTOMY  ~ 1953  . VESICOVAGINAL FISTULA CLOSURE W/ TAH  1988  . VIDEO BRONCHOSCOPY Bilateral 08/23/2016   Procedure: VIDEO BRONCHOSCOPY WITH FLUORO;  Surgeon: Javier Glazier, MD;  Location: [facility];  Service: Cardiopulmonary;  Laterality:  Bilateral;   Current Outpatient Medications on File Prior to Visit  Medication Sig Dispense Refill  . albuterol (PROAIR HFA) 108 (90 Base) MCG/ACT inhaler Inhale 2 puffs into the lungs every 4 (four) hours as needed for wheezing or shortness of breath. 18 g 5  . Alpha-D-Galactosidase (BEANO PO) Take 1 capsule by mouth 3 (three) times daily as needed (gas/ bloating).     . Alpha-Lipoic Acid 600 MG CAPS Take 600 mg by mouth daily.     Marland Kitchen alum & mag hydroxide-simeth (MAALOX/MYLANTA) 200-200-20 MG/5ML suspension Take 15 mLs by mouth every 4 (four) hours as needed for indigestion or heartburn. 355 mL 0  . azelastine (ASTELIN) 0.1 % nasal spray USE 2 SPRAYS IN EACH NOSTRIL DAILY AS DIRECTED 30 mL 5  . CALCIUM-MAGNESIUM PO Take 1 tablet by mouth at bedtime.    . cetirizine (ZYRTEC) 10 MG tablet Take 5 mg by mouth every evening.     . Cholecalciferol (VITAMIN D3) 10000 units TABS Take 1,000 Units by mouth daily.    . cyproheptadine (PERIACTIN) 4 MG tablet TAKE 2 TABLETS BY MOUTH EVERY EVENING (Patient taking differently: TAKE 1 TABLET BY MOUTH EVERY EVENING) 60 tablet 5  . denosumab (PROLIA) 60 MG/ML SOLN injection Inject 60 mg into the skin every 6 (six) months. Administer in upper arm, thigh, or abdomen    . dexlansoprazole (DEXILANT) 60 MG capsule Take 1 capsule (60 mg total) by mouth daily. 30 capsule 5  . diclofenac sodium (VOLTAREN) 1 %  GEL Apply 2 g topically 2 (two) times daily.    Marland Kitchen EPINEPHrine (EPIPEN 2-PAK) 0.3 mg/0.3 mL IJ SOAJ injection Inject 0.3 mLs (0.3 mg total) into the muscle Once PRN for up to 1 dose. 1 Device 1  . gabapentin (NEURONTIN) 300 MG capsule Take 600 mg by mouth daily.     Marland Kitchen guaiFENesin (MUCINEX) 600 MG 12 hr tablet Take 600 mg by mouth daily.    . hydrocortisone (ANUSOL-HC) 2.5 % rectal cream APPLY AS NEEDED AS DIRECTED 30 g 5  . Lactase (LACTAID FAST ACT) 9000 units TABS Take 18,000 Units by mouth 4 (four) times daily as needed (dairy consumption).     Marland Kitchen lidocaine (LIDODERM) 5 % Place 1 patch onto the skin daily as needed (pain). Remove & Discard patch within 12 hours or as directed by MD    . mirtazapine (REMERON SOL-TAB) 30 MG disintegrating tablet DISSOLVE 1 TABLET IN MOUTH EVERY NIGHT AT BEDTIME 30 tablet 5  . mometasone (NASONEX) 50 MCG/ACT nasal spray Place 2 sprays into the nose daily.    . mupirocin ointment (BACTROBAN) 2 % Place 1 application into the nose at bedtime. To prevent nose bleeds    . nystatin (MYCOSTATIN) 100000 UNIT/ML suspension Use as directed 5 mLs (500,000 Units total) in the mouth or throat at bedtime. Swish and spit    . nystatin-triamcinolone ointment (MYCOLOG) Apply 1 application topically 3 (three) times daily as needed (yeast infection).     . Prenatal Vit-Sel-Fe Fum-FA (VINATE M) 27-1 MG TABS Take 1 tablet by mouth daily. 90 each 3  . PRESCRIPTION MEDICATION Place 2 puffs into both ears once a week. Place capsule in dispenser and instill 2 puffs in each ear once a week after washing hair. CS (X) AHCL    CHLORAMP/SULFU/AMPHO B/ H DROC 100-100-5 mg capsule - compounded at [facility]    . Probiotic Product (ALIGN) 4 MG CAPS Take 4 mg by mouth every evening.     Marland Kitchen Propylene  Glycol-Glycerin (SOOTHE OP) Place 1 drop into both eyes 2 (two) times daily as needed (dry eyes).     . ranitidine (ZANTAC) 300 MG tablet Take 1 tablet (300 mg total) by mouth every evening.    Marland Kitchen  Respiratory Therapy Supplies (FLUTTER) DEVI Use as directed 1 each 0  . saccharomyces boulardii (FLORASTOR) 250 MG capsule Take 1 capsule (250 mg total) by mouth 2 (two) times daily. 60 capsule 2  . SIMPLY SALINE NA Place 1 spray into both nostrils at bedtime.    Marland Kitchen Spacer/Aero-Holding Chambers (AEROCHAMBER MV) inhaler Use as instructed 1 each 0  . Tiotropium Bromide Monohydrate (SPIRIVA RESPIMAT) 2.5 MCG/ACT AERS Inhale 2 puffs into the lungs daily. 1 Inhaler 5  . predniSONE (DELTASONE) 10 MG tablet Take 1 tablet (10 mg total) by mouth daily with breakfast. (Patient not taking: Reported on 02/07/2018) 7 tablet 0   No current facility-administered medications on file prior to visit.    Allergies  Allergen Reactions  . Dust Mite Extract Shortness Of Breath and Other (See Comments)    "sneezing" (02/20/2012)  . Molds & Smuts Shortness Of Breath  . Morphine Sulfate Itching  . Other Shortness Of Breath and Other (See Comments)    Grass and weeds "sneezing; filled sinuses" (02/20/2012)  . Penicillins Rash and Other (See Comments)    "welts" (02/20/2012) Has patient had a PCN reaction causing immediate rash, facial/tongue/throat swelling, SOB or lightheadedness with hypotension: Unknown Has patient had a PCN reaction causing severe rash involving mucus membranes or skin necrosis: No Has patient had a PCN reaction that required hospitalization: No Has patient had a PCN reaction occurring within the last 10 years: No If all of the above answers are "NO", then may proceed with Cephalosporin use.   Marland Kitchen Reclast [Zoledronic Acid] Other (See Comments)    Fever, Put in hospital, dr said it was a reaction from a reaction   . Rofecoxib Swelling    Vioxx REACTION: feet swelling  . Shrimp Flavor Anaphylaxis    ALL SHELLFISH  . Tetracycline Hcl Nausea And Vomiting  . Xolair [Omalizumab] Other (See Comments)    Caused Blood clot  . Dilaudid [Hydromorphone Hcl] Itching  . Hydrocodone-Acetaminophen Nausea And  Vomiting  . Levofloxacin Other (See Comments)    REACTION: GI upset  . Oxycodone Hcl Nausea And Vomiting  . Paroxetine Nausea And Vomiting    Paxil   . Celecoxib Swelling    Feet swelling  . Diltiazem Swelling  . Lactose Intolerance (Gi) Other (See Comments)    Bloating and gas  . Tree Extract Other (See Comments)    "tested and told I was allergic to it; never experienced a reaction to it" (02/20/2012)   Social History   Socioeconomic History  . Marital status: Divorced    Spouse name: Not on file  . Number of children: 2  . Years of education: college  . Highest education level: Not on file  Occupational History  . Occupation: Disabled    Comment: Retired Engineer, production: RETIRED  Social Needs  . Financial resource strain: Not on file  . Food insecurity:    Worry: Not on file    Inability: Not on file  . Transportation needs:    Medical: Not on file    Non-medical: Not on file  Tobacco Use  . Smoking status: Passive Smoke Exposure - Never Smoker  . Smokeless tobacco: Never Used  . Tobacco comment: Parents  Substance and  Sexual Activity  . Alcohol use: Yes    Alcohol/week: 0.0 standard drinks    Comment: 02/20/2012 "couple glasses of wine/6 months"  . Drug use: No  . Sexual activity: Never  Lifestyle  . Physical activity:    Days per week: Not on file    Minutes per session: Not on file  . Stress: Not on file  Relationships  . Social connections:    Talks on phone: Not on file    Gets together: Not on file    Attends religious service: Not on file    Active member of club or organization: Not on file    Attends meetings of clubs or organizations: Not on file    Relationship status: Not on file  . Intimate partner violence:    Fear of current or ex partner: Not on file    Emotionally abused: Not on file    Physically abused: Not on file    Forced sexual activity: Not on file  Other Topics Concern  . Not on file  Social History  Narrative   Patient lives at home alone. Patient  divorced.    Patient has her BS degree.   Right handed.   Caffeine- sometimes coffee.      [facility]:   Born in [place], [place]. She worked as a Copywriter, advertising. She has no pets currently. She does have indoor plants. Previously had mold in her home that was remediated. Carpet was removed.            Review of Systems  All other systems reviewed and are negative.      Objective:   Physical Exam Vitals signs reviewed.  Constitutional:      Appearance: She is well-developed.  Cardiovascular:     Rate and Rhythm: Normal rate and regular rhythm.     Heart sounds: Normal heart sounds.  Pulmonary:     Effort: Pulmonary effort is normal. No respiratory distress.     Breath sounds: Normal breath sounds. No stridor. No wheezing.  Abdominal:     General: Bowel sounds are normal.     Palpations: Abdomen is soft.  Skin:    Findings: Erythema and lesion present.   see photo        Assessment & Plan:  Elevated LFTs - Plan: COMPLETE METABOLIC PANEL WITH GFR, ANA, Mitochondrial Antibodies, Anti-Smooth Muscle Antibody, IGG Cellulitis of the right upper extremity  The wound was covered in Silvadene, covered with nonadherent gauze, and wrapped gently and Covan.  Wound care was discussed.  Recommend doxycycline 100 mg p.o. twice daily for 10 days given her history of an allergy to penicillin.  This should allow strep and staph coverage including MRSA.  Given her recent elevations in liver function test, I will check a CMP to monitor her liver function test, check an ANA as well as antimitochondrial antibodies and anti-smooth muscle antibodies to evaluate for autoimmune hepatitis.  I have recommended using Tylenol sparingly for fever if greater than 101.  Given Xarelto I want her to avoid NSAIDs however if the fever rises greater than 101 and occasional use of ibuprofen for fever would be reasonable.

## 2018-02-10 ENCOUNTER — Ambulatory Visit: Payer: Medicare Other | Admitting: Family Medicine

## 2018-02-10 ENCOUNTER — Observation Stay (HOSPITAL_COMMUNITY)
Admission: EM | Admit: 2018-02-10 | Discharge: 2018-02-12 | Disposition: A | Discharge status: Home / Self Care | Payer: Medicare Other | Attending: Internal Medicine | Admitting: Internal Medicine

## 2018-02-10 ENCOUNTER — Observation Stay (HOSPITAL_COMMUNITY): Payer: Medicare Other

## 2018-02-10 ENCOUNTER — Other Ambulatory Visit: Payer: Self-pay

## 2018-02-10 ENCOUNTER — Emergency Department (HOSPITAL_COMMUNITY): Payer: Medicare Other

## 2018-02-10 ENCOUNTER — Encounter (HOSPITAL_COMMUNITY): Payer: Self-pay | Admitting: *Deleted

## 2018-02-10 DIAGNOSIS — I82401 Acute embolism and thrombosis of unspecified deep veins of right lower extremity: Secondary | ICD-10-CM | POA: Diagnosis not present

## 2018-02-10 DIAGNOSIS — F329 Major depressive disorder, single episode, unspecified: Secondary | ICD-10-CM | POA: Diagnosis not present

## 2018-02-10 DIAGNOSIS — L03119 Cellulitis of unspecified part of limb: Secondary | ICD-10-CM | POA: Diagnosis not present

## 2018-02-10 DIAGNOSIS — R6510 Systemic inflammatory response syndrome (SIRS) of non-infectious origin without acute organ dysfunction: Secondary | ICD-10-CM

## 2018-02-10 DIAGNOSIS — E039 Hypothyroidism, unspecified: Secondary | ICD-10-CM | POA: Diagnosis not present

## 2018-02-10 DIAGNOSIS — J4540 Moderate persistent asthma, uncomplicated: Secondary | ICD-10-CM | POA: Diagnosis not present

## 2018-02-10 DIAGNOSIS — Z853 Personal history of malignant neoplasm of breast: Secondary | ICD-10-CM | POA: Diagnosis not present

## 2018-02-10 DIAGNOSIS — K580 Irritable bowel syndrome with diarrhea: Secondary | ICD-10-CM

## 2018-02-10 DIAGNOSIS — M797 Fibromyalgia: Secondary | ICD-10-CM | POA: Diagnosis present

## 2018-02-10 DIAGNOSIS — K589 Irritable bowel syndrome without diarrhea: Secondary | ICD-10-CM | POA: Diagnosis present

## 2018-02-10 DIAGNOSIS — K219 Gastro-esophageal reflux disease without esophagitis: Secondary | ICD-10-CM | POA: Diagnosis present

## 2018-02-10 DIAGNOSIS — G894 Chronic pain syndrome: Secondary | ICD-10-CM | POA: Diagnosis not present

## 2018-02-10 DIAGNOSIS — R509 Fever, unspecified: Principal | ICD-10-CM | POA: Diagnosis present

## 2018-02-10 DIAGNOSIS — R1084 Generalized abdominal pain: Secondary | ICD-10-CM

## 2018-02-10 DIAGNOSIS — E0500 Thyrotoxicosis with diffuse goiter without thyrotoxic crisis or storm: Secondary | ICD-10-CM | POA: Insufficient documentation

## 2018-02-10 DIAGNOSIS — Z7901 Long term (current) use of anticoagulants: Secondary | ICD-10-CM | POA: Diagnosis not present

## 2018-02-10 DIAGNOSIS — R05 Cough: Secondary | ICD-10-CM

## 2018-02-10 DIAGNOSIS — Z79899 Other long term (current) drug therapy: Secondary | ICD-10-CM | POA: Diagnosis not present

## 2018-02-10 DIAGNOSIS — J302 Other seasonal allergic rhinitis: Secondary | ICD-10-CM

## 2018-02-10 DIAGNOSIS — E785 Hyperlipidemia, unspecified: Secondary | ICD-10-CM | POA: Diagnosis not present

## 2018-02-10 DIAGNOSIS — J3089 Other allergic rhinitis: Secondary | ICD-10-CM | POA: Diagnosis present

## 2018-02-10 DIAGNOSIS — R059 Cough, unspecified: Secondary | ICD-10-CM | POA: Diagnosis present

## 2018-02-10 DIAGNOSIS — I824Y1 Acute embolism and thrombosis of unspecified deep veins of right proximal lower extremity: Secondary | ICD-10-CM | POA: Diagnosis not present

## 2018-02-10 DIAGNOSIS — R109 Unspecified abdominal pain: Secondary | ICD-10-CM

## 2018-02-10 DIAGNOSIS — E7800 Pure hypercholesterolemia, unspecified: Secondary | ICD-10-CM | POA: Insufficient documentation

## 2018-02-10 DIAGNOSIS — F32A Depression, unspecified: Secondary | ICD-10-CM | POA: Diagnosis present

## 2018-02-10 DIAGNOSIS — E876 Hypokalemia: Secondary | ICD-10-CM | POA: Diagnosis not present

## 2018-02-10 DIAGNOSIS — D72829 Elevated white blood cell count, unspecified: Secondary | ICD-10-CM | POA: Diagnosis present

## 2018-02-10 DIAGNOSIS — R112 Nausea with vomiting, unspecified: Secondary | ICD-10-CM

## 2018-02-10 DIAGNOSIS — I82409 Acute embolism and thrombosis of unspecified deep veins of unspecified lower extremity: Secondary | ICD-10-CM | POA: Diagnosis present

## 2018-02-10 DIAGNOSIS — F418 Other specified anxiety disorders: Secondary | ICD-10-CM | POA: Diagnosis not present

## 2018-02-10 DIAGNOSIS — R52 Pain, unspecified: Secondary | ICD-10-CM

## 2018-02-10 DIAGNOSIS — R197 Diarrhea, unspecified: Secondary | ICD-10-CM

## 2018-02-10 HISTORY — DX: Systemic inflammatory response syndrome (sirs) of non-infectious origin without acute organ dysfunction: R65.10

## 2018-02-10 LAB — COMPREHENSIVE METABOLIC PANEL
ALT: 22 U/L (ref 0–44)
AST: 22 U/L (ref 15–41)
Albumin: 2.4 g/dL — ABNORMAL LOW (ref 3.5–5.0)
Alkaline Phosphatase: 60 U/L (ref 38–126)
Anion gap: 12 (ref 5–15)
BUN: 10 mg/dL (ref 8–23)
CO2: 20 mmol/L — ABNORMAL LOW (ref 22–32)
Calcium: 7.9 mg/dL — ABNORMAL LOW (ref 8.9–10.3)
Chloride: 105 mmol/L (ref 98–111)
Creatinine, Ser: 0.88 mg/dL (ref 0.44–1.00)
GFR calc Af Amer: >60 mL/min (ref >60)
GFR calc non Af Amer: >60 mL/min (ref >60)
Glucose, Bld: 128 mg/dL — ABNORMAL HIGH (ref 70–99)
Potassium: 3.4 mmol/L — ABNORMAL LOW (ref 3.5–5.1)
Sodium: 137 mmol/L (ref 135–145)
Total Bilirubin: 1.1 mg/dL (ref 0.3–1.2)
Total Protein: 6 g/dL — ABNORMAL LOW (ref 6.5–8.1)

## 2018-02-10 LAB — MAGNESIUM: Magnesium: 1.4 mg/dL — ABNORMAL LOW (ref 1.7–2.4)

## 2018-02-10 LAB — CBC WITH DIFFERENTIAL/PLATELET
Abs Immature Granulocytes: 0.18 10*3/uL — ABNORMAL HIGH (ref 0.00–0.07)
Basophils Absolute: 0 10*3/uL (ref 0.0–0.1)
Basophils Relative: 0 %
Eosinophils Absolute: 0 10*3/uL (ref 0.0–0.5)
Eosinophils Relative: 0 %
HCT: 28.4 % — ABNORMAL LOW (ref 36.0–46.0)
Hemoglobin: 8.8 g/dL — ABNORMAL LOW (ref 12.0–15.0)
Immature Granulocytes: 1 %
Lymphocytes Relative: 10 %
Lymphs Abs: 1.3 10*3/uL (ref 0.7–4.0)
MCH: 30.9 pg (ref 26.0–34.0)
MCHC: 31 g/dL (ref 30.0–36.0)
MCV: 99.6 fL (ref 80.0–100.0)
Monocytes Absolute: 2 10*3/uL — ABNORMAL HIGH (ref 0.1–1.0)
Monocytes Relative: 16 %
Neutro Abs: 9.5 10*3/uL — ABNORMAL HIGH (ref 1.7–7.7)
Neutrophils Relative %: 73 %
Platelets: 359 10*3/uL (ref 150–400)
RBC: 2.85 MIL/uL — ABNORMAL LOW (ref 3.87–5.11)
RDW: 17.4 % — ABNORMAL HIGH (ref 11.5–15.5)
WBC: 12.9 10*3/uL — ABNORMAL HIGH (ref 4.0–10.5)
nRBC: 0 % (ref 0.0–0.2)

## 2018-02-10 LAB — TSH: TSH: 2.688 u[IU]/mL (ref 0.350–4.500)

## 2018-02-10 LAB — PHOSPHORUS: Phosphorus: 2.1 mg/dL — ABNORMAL LOW (ref 2.5–4.6)

## 2018-02-10 LAB — PROCALCITONIN: Procalcitonin: 0.41 ng/mL

## 2018-02-10 LAB — I-STAT CG4 LACTIC ACID, ED
Lactic Acid, Venous: 1.26 mmol/L (ref 0.5–1.9)
Lactic Acid, Venous: 0.92 mmol/L (ref 0.5–1.9)

## 2018-02-10 MED ORDER — AZELASTINE HCL 0.1 % NA SOLN
1.0000 | Freq: Two times a day (BID) | NASAL | Status: DC
  Administered 2018-02-10 – 2018-02-11 (×3): 1 via NASAL
  Filled 2018-02-10: qty 30

## 2018-02-10 MED ORDER — ONDANSETRON HCL 4 MG/2ML IJ SOLN: 4.0000 mg | Freq: Four times a day (QID) | INTRAMUSCULAR | Status: DC | PRN

## 2018-02-10 MED ORDER — LORATADINE 10 MG PO TABS
10.0000 mg | ORAL_TABLET | Freq: Every day | ORAL | Status: DC
  Administered 2018-02-11 – 2018-02-12 (×2): 10 mg via ORAL
  Filled 2018-02-10 (×2): qty 1

## 2018-02-10 MED ORDER — ACETAMINOPHEN 325 MG PO TABS
650.0000 mg | ORAL_TABLET | Freq: Four times a day (QID) | ORAL | Status: DC | PRN
  Administered 2018-02-10 – 2018-02-12 (×3): 650 mg via ORAL
  Filled 2018-02-10 (×3): qty 2

## 2018-02-10 MED ORDER — NYSTATIN 100000 UNIT/ML MT SUSP
5.0000 mL | Freq: Every day | OROMUCOSAL | Status: DC
  Administered 2018-02-10: 500000 [IU] via OROMUCOSAL
  Filled 2018-02-10 (×2): qty 5

## 2018-02-10 MED ORDER — VANCOMYCIN HCL IN DEXTROSE 1-5 GM/200ML-% IV SOLN
1000.0000 mg | Freq: Once | INTRAVENOUS | Status: AC
  Administered 2018-02-10: 1000 mg via INTRAVENOUS
  Filled 2018-02-10: qty 200

## 2018-02-10 MED ORDER — ALBUTEROL SULFATE (2.5 MG/3ML) 0.083% IN NEBU: 2.5000 mg | INHALATION_SOLUTION | Freq: Four times a day (QID) | RESPIRATORY_TRACT | Status: DC | PRN

## 2018-02-10 MED ORDER — CEFAZOLIN SODIUM-DEXTROSE 1-4 GM/50ML-% IV SOLN
1.0000 g | Freq: Three times a day (TID) | INTRAVENOUS | Status: DC
  Administered 2018-02-10 – 2018-02-11 (×3): 1 g via INTRAVENOUS
  Filled 2018-02-10 (×6): qty 50

## 2018-02-10 MED ORDER — ACETAMINOPHEN 650 MG RE SUPP: 650.0000 mg | Freq: Four times a day (QID) | RECTAL | Status: DC | PRN

## 2018-02-10 MED ORDER — LACTASE 3000 UNITS PO TABS
18000.0000 [IU] | ORAL_TABLET | Freq: Four times a day (QID) | ORAL | Status: DC | PRN
  Filled 2018-02-10: qty 6

## 2018-02-10 MED ORDER — IPRATROPIUM-ALBUTEROL 0.5-2.5 (3) MG/3ML IN SOLN
3.0000 mL | Freq: Four times a day (QID) | RESPIRATORY_TRACT | Status: DC
  Administered 2018-02-10: 3 mL via RESPIRATORY_TRACT
  Filled 2018-02-10: qty 3

## 2018-02-10 MED ORDER — SODIUM CHLORIDE 0.9 % IV SOLN
1000.0000 mL | INTRAVENOUS | Status: DC
  Administered 2018-02-10 – 2018-02-11 (×3): 1000 mL via INTRAVENOUS

## 2018-02-10 MED ORDER — SODIUM CHLORIDE 0.9 % IV SOLN
1000.0000 mL | INTRAVENOUS | Status: DC
  Administered 2018-02-10: 1000 mL via INTRAVENOUS

## 2018-02-10 MED ORDER — GABAPENTIN 300 MG PO CAPS
600.0000 mg | ORAL_CAPSULE | Freq: Every day | ORAL | Status: DC
  Administered 2018-02-11 – 2018-02-12 (×2): 600 mg via ORAL
  Filled 2018-02-10 (×2): qty 2

## 2018-02-10 MED ORDER — MIRTAZAPINE 30 MG PO TBDP
30.0000 mg | ORAL_TABLET | Freq: Every day | ORAL | Status: DC
  Administered 2018-02-10 – 2018-02-11 (×2): 30 mg via ORAL
  Filled 2018-02-10 (×3): qty 1

## 2018-02-10 MED ORDER — EPINEPHRINE 0.3 MG/0.3ML IJ SOAJ: 0.3000 mg | Freq: Once | INTRAMUSCULAR | Status: DC | PRN

## 2018-02-10 MED ORDER — VANCOMYCIN 50 MG/ML ORAL SOLUTION
125.0000 mg | Freq: Four times a day (QID) | ORAL | Status: DC
  Administered 2018-02-10: 125 mg via ORAL
  Filled 2018-02-10 (×2): qty 2.5

## 2018-02-10 MED ORDER — IPRATROPIUM-ALBUTEROL 0.5-2.5 (3) MG/3ML IN SOLN
3.0000 mL | Freq: Two times a day (BID) | RESPIRATORY_TRACT | Status: DC
  Administered 2018-02-11 (×2): 3 mL via RESPIRATORY_TRACT
  Filled 2018-02-10 (×3): qty 3

## 2018-02-10 MED ORDER — SACCHAROMYCES BOULARDII 250 MG PO CAPS
250.0000 mg | ORAL_CAPSULE | Freq: Two times a day (BID) | ORAL | Status: DC
  Administered 2018-02-10 – 2018-02-12 (×4): 250 mg via ORAL
  Filled 2018-02-10 (×4): qty 1

## 2018-02-10 MED ORDER — FLUTICASONE PROPIONATE 50 MCG/ACT NA SUSP
2.0000 | Freq: Every day | NASAL | Status: DC
  Administered 2018-02-11 – 2018-02-12 (×2): 2 via NASAL
  Filled 2018-02-10: qty 16

## 2018-02-10 MED ORDER — ONDANSETRON HCL 4 MG PO TABS: 4.0000 mg | ORAL_TABLET | Freq: Four times a day (QID) | ORAL | Status: DC | PRN

## 2018-02-10 MED ORDER — ALUM & MAG HYDROXIDE-SIMETH 200-200-20 MG/5ML PO SUSP: 15.0000 mL | ORAL | Status: DC | PRN

## 2018-02-10 MED ORDER — POTASSIUM CHLORIDE CRYS ER 20 MEQ PO TBCR
40.0000 meq | EXTENDED_RELEASE_TABLET | Freq: Two times a day (BID) | ORAL | Status: AC
  Administered 2018-02-10 – 2018-02-11 (×2): 40 meq via ORAL
  Filled 2018-02-10 (×2): qty 2

## 2018-02-10 MED ORDER — ALPHA-LIPOIC ACID 600 MG PO CAPS: 600.0000 mg | ORAL_CAPSULE | Freq: Every day | ORAL | Status: DC

## 2018-02-10 MED ORDER — ALIGN 4 MG PO CAPS: 4.0000 mg | ORAL_CAPSULE | Freq: Every evening | ORAL | Status: DC

## 2018-02-10 MED ORDER — RIVAROXABAN 20 MG PO TABS
20.0000 mg | ORAL_TABLET | Freq: Every day | ORAL | Status: DC
  Administered 2018-02-10 – 2018-02-11 (×2): 20 mg via ORAL
  Filled 2018-02-10 (×2): qty 1

## 2018-02-10 MED ORDER — GUAIFENESIN ER 600 MG PO TB12
1200.0000 mg | ORAL_TABLET | Freq: Two times a day (BID) | ORAL | Status: DC
  Administered 2018-02-10 – 2018-02-12 (×4): 1200 mg via ORAL
  Filled 2018-02-10 (×4): qty 2

## 2018-02-10 NOTE — ED Triage Notes (Signed)
Per EMS, pt from home complains of fever, nausea, emesis, diarrhea for the past few weeks. Pt has wound to right forearm. Pt had fever of 102 w/ EMS.   HR 90s BP 105/87 RR 24 O2 97%

## 2018-02-10 NOTE — H&P (Signed)
History and Physical    Ebony Scott PNT:[id number] DOB: 02-03-1951 DOA: 02/10/2018  PCP: [name], MD   Patient coming from: Home  Chief Complaint: Fever of 103  HPI: Ebony Scott is a 67 y.o. female with medical history significant of above medical comorbidities including allergic rhinitis, anxiety and depression, history of a breast cancer in remission, history of cataract surgery, chronic pain, diverticulosis, fibromyalgia, GERD, Graves' disease, hypercholesterolemia, irritable bowel syndrome, hypothyroidism moderate persistent asthma and osteoporosis along with history of C. difficile colitis who presented today with a history of fatigue, and a fever of 103 with associated nausea and vomiting.  States that she had a fever of 103 2 days ago and started having nausea or vomiting.  Constantly coughing up and more so lately. States she stays cold and had a history of a blood clot in her right leg into the lungs.  She has been taking antibiotics in the outpatient setting for her right forearm wound.  No nausea last couple days but started today.  States she is limping a lot of coughing but denies any chest pain.  Does admit to having some shortness of breath and did admit to having some loose watery diarrhea have been 3-4 times a day.  Has had C. difficile in the past and had formed movements the last 2 to 3 weeks but had loose bowel movements today and does not know if it is her irritable bowel syndrome.  Because of her fever her son brought her in to the emergency room for further evaluation recommendations and patient was called to admit this patient for fever, diarrhea and emesis associate with abdominal pain.  Gust the case with Dr. Linus Salmons of infectious diseases and we have low suspicion for C. difficile as of now and recommended not starting empiric treatment but obtaining further work-up  ED Course: In the ED patient basic blood work done as well as blood cultures and urinalysis.  She is  given a liter of normal saline.  Also given IV vancomycin for her Right forearm.  Review of Systems: As per HPI otherwise 10 point review of systems negative.   Past Medical History:  Diagnosis Date  . Allergic rhinitis   . Allergy    SEASONAL  . Anemia   . Anxiety    pt denies  . Arthritis    "mostly the hands" (02/20/2012)  . Asthma   . Breast cancer ([place]) 1998   in remission  . Cataract    REMOVED  . Chronic lower back pain   . Chronic neck pain   . Complication of anesthesia    "had hard time waking up from it several times" (02/20/2012)  . Depression    "some; don't take anything for it" (02/20/2012)  . Diverticulosis   . DVT (deep venous thrombosis) ([place])   . Exertional dyspnea   . Fibromyalgia 11/2011  . GERD (gastroesophageal reflux disease)   . Graves disease   . Headache(784.0)    "related to allergies; more at different times during the year" (02/20/2012)  . Hemorrhoids   . Hiatal hernia    back and neck  . Hx of adenomatous colonic polyps 04/12/2016  . Hypercholesteremia    good cholesterol is high  . Hypothyroidism   . IBS (irritable bowel syndrome)   . Moderate persistent asthma    -FeV1 72% 2011, -IgE 102 2011, CT sinus Neg 2011  . Osteoporosis    on reclast yearly  . Pneumonia 04/2011; ~  11/2011   "double; single" (02/20/2012)   Past Surgical History:  Procedure Laterality Date  . ANTERIOR AND POSTERIOR REPAIR  1990's  . APPENDECTOMY    . BREAST LUMPECTOMY  1998   left  . CARPOMETACARPEL ([place]) FUSION OF THUMB WITH AUTOGRAFT FROM RADIUS  ~ 2009   "both thumbs" (02/20/2012)  . CATARACT EXTRACTION W/ INTRAOCULAR LENS  IMPLANT, BILATERAL  2012  . CERVICAL DISCECTOMY  10/2001   C5-C6  . CERVICAL FUSION  2003   C3-C4  . CHOLECYSTECTOMY    . COLONOSCOPY    . DEBRIDEMENT TENNIS ELBOW  ?1970's   right  . ESOPHAGOGASTRODUODENOSCOPY    . HYSTERECTOMY    . KNEE ARTHROPLASTY  ?1990's   "?right; w/cartilage repair" (02/20/2012)  . NASAL SEPTUM SURGERY  1980's    . POSTERIOR CERVICAL FUSION/FORAMINOTOMY  2004   "failed initial fusion; rewired  anterior neck" (02/20/2012)  . TONSILLECTOMY  ~ 1953  . VESICOVAGINAL FISTULA CLOSURE W/ TAH  1988  . VIDEO BRONCHOSCOPY Bilateral 08/23/2016   Procedure: VIDEO BRONCHOSCOPY WITH FLUORO;  Surgeon: Javier Glazier, MD;  Location: [facility];  Service: Cardiopulmonary;  Laterality: Bilateral;   SOCIAL HISTORY  reports that she is a non-smoker but has been exposed to tobacco smoke. She has never used smokeless tobacco. She reports current alcohol use. She reports that she does not use drugs.  Allergies  Allergen Reactions  . Dust Mite Extract Shortness Of Breath and Other (See Comments)    "sneezing" (02/20/2012)  . Molds & Smuts Shortness Of Breath  . Morphine Sulfate Itching  . Other Shortness Of Breath and Other (See Comments)    Grass and weeds "sneezing; filled sinuses" (02/20/2012)  . Penicillins Rash and Other (See Comments)    "welts" (02/20/2012) Has patient had a PCN reaction causing immediate rash, facial/tongue/throat swelling, SOB or lightheadedness with hypotension: Unknown Has patient had a PCN reaction causing severe rash involving mucus membranes or skin necrosis: No Has patient had a PCN reaction that required hospitalization: No Has patient had a PCN reaction occurring within the last 10 years: No If all of the above answers are "NO", then may proceed with Cephalosporin use.   Marland Kitchen Reclast [Zoledronic Acid] Other (See Comments)    Fever, Put in hospital, dr said it was a reaction from a reaction   . Rofecoxib Swelling    Vioxx REACTION: feet swelling  . Shrimp Flavor Anaphylaxis    ALL SHELLFISH  . Tetracycline Hcl Nausea And Vomiting  . Xolair [Omalizumab] Other (See Comments)    Caused Blood clot  . Dilaudid [Hydromorphone Hcl] Itching  . Hydrocodone-Acetaminophen Nausea And Vomiting  . Levofloxacin Other (See Comments)    REACTION: GI upset  . Oxycodone Hcl Nausea And Vomiting  .  Paroxetine Nausea And Vomiting    Paxil   . Celecoxib Swelling    Feet swelling  . Diltiazem Swelling  . Lactose Intolerance (Gi) Other (See Comments)    Bloating and gas  . Tree Extract Other (See Comments)    "tested and told I was allergic to it; never experienced a reaction to it" (02/20/2012)   Family History  Problem Relation Age of Onset  . Allergies Mother   . Heart disease Mother   . Arthritis Mother   . Lung cancer Mother   . Diabetes Mother   . Allergies Father   . Heart disease Father   . Arthritis Father   . Stroke Father   . Colon cancer Other  Maternal half aunt/Maternal half uncle  . Colitis Daughter   . Diabetes Maternal Grandfather    Prior to Admission medications   Medication Sig Start Date End Date Taking? Authorizing Provider  albuterol (PROAIR HFA) 108 (90 Base) MCG/ACT inhaler Inhale 2 puffs into the lungs every 4 (four) hours as needed for wheezing or shortness of breath. 02/18/17  Yes [name], MD  Alpha-D-Galactosidase (BEANO PO) Take 1 capsule by mouth 3 (three) times daily as needed (gas/ bloating).    Yes [provider]  Alpha-Lipoic Acid 600 MG CAPS Take 600 mg by mouth daily.    Yes [provider]  azelastine (ASTELIN) 0.1 % nasal spray USE 2 SPRAYS IN EACH NOSTRIL DAILY AS DIRECTED 07/16/17  Yes Kozlow, Donnamarie Poag, MD  CALCIUM-MAGNESIUM PO Take 1 tablet by mouth at bedtime.   Yes [provider]  cetirizine (ZYRTEC) 10 MG tablet Take 5 mg by mouth every evening.    Yes [provider]  cholecalciferol (VITAMIN D) 25 MCG (1000 UT) tablet Take 1,000 Units by mouth daily.    Yes [provider]  cyproheptadine (PERIACTIN) 4 MG tablet TAKE 2 TABLETS BY MOUTH EVERY EVENING Patient taking differently: TAKE 1 TABLET BY MOUTH EVERY EVENING 07/16/17  Yes Kozlow, Donnamarie Poag, MD  denosumab (PROLIA) 60 MG/ML SOLN injection Inject 60 mg into the skin every 6 (six) months. Administer in upper arm, thigh, or  abdomen   Yes [provider]  dexlansoprazole (DEXILANT) 60 MG capsule Take 1 capsule (60 mg total) by mouth daily. 07/16/17  Yes Kozlow, Donnamarie Poag, MD  diclofenac sodium (VOLTAREN) 1 % GEL Apply 2 g topically 2 (two) times daily. 01/23/18  Yes Aline August, MD  doxycycline (VIBRA-TABS) 100 MG tablet Take 1 tablet (100 mg total) by mouth 2 (two) times daily. 02/07/18  Yes [name], MD  gabapentin (NEURONTIN) 300 MG capsule Take 600 mg by mouth daily.    Yes [provider]  Lactase (LACTAID FAST ACT) 9000 units TABS Take 18,000 Units by mouth 4 (four) times daily as needed (dairy consumption).    Yes [provider]  lidocaine (LIDODERM) 5 % Place 1 patch onto the skin daily as needed (pain). Remove & Discard patch within 12 hours or as directed by MD 01/23/18  Yes Aline August, MD  mirtazapine (REMERON SOL-TAB) 30 MG disintegrating tablet DISSOLVE 1 TABLET IN MOUTH EVERY NIGHT AT BEDTIME 01/10/18  Yes Gatha Mayer, MD  mometasone (NASONEX) 50 MCG/ACT nasal spray Place 2 sprays into the nose daily. 01/23/18  Yes Aline August, MD  mupirocin ointment (BACTROBAN) 2 % Place 1 application into the nose at bedtime. To prevent nose bleeds 01/23/18  Yes Aline August, MD  nystatin (MYCOSTATIN) 100000 UNIT/ML suspension Use as directed 5 mLs (500,000 Units total) in the mouth or throat at bedtime. Swish and spit 01/23/18  Yes Aline August, MD  PRESCRIPTION MEDICATION Place 2 puffs into both ears once a week. Place capsule in dispenser and instill 2 puffs in each ear once a week after washing hair. CS (X) AHCL    CHLORAMP/SULFU/AMPHO B/ H DROC 100-100-5 mg capsule - compounded at [facility]   Yes [provider]  Probiotic Product (ALIGN) 4 MG CAPS Take 4 mg by mouth every evening.    Yes [provider]  Propylene Glycol-Glycerin (SOOTHE OP) Place 1 drop into both eyes 2 (two) times daily as needed (dry eyes).    Yes [provider]  ranitidine (ZANTAC) 300 MG tablet Take 1 tablet (300 mg total) by mouth every evening. 01/23/18  Yes Aline August, MD  Respiratory Therapy Supplies (FLUTTER) DEVI Use as directed 10/22/14  Yes Elsie Stain, MD  rivaroxaban (XARELTO) 20 MG TABS tablet Take 1 tablet (20 mg total) by mouth daily with supper. 02/07/18  Yes [name], MD  saccharomyces boulardii (FLORASTOR) 250 MG capsule Take 1 capsule (250 mg total) by mouth 2 (two) times daily. 01/24/18  Yes [name], MD  silver sulfADIAZINE (SILVADENE) 1 % cream Apply 1 application topically daily. 02/07/18  Yes [name], MD  Spacer/Aero-Holding Chambers (AEROCHAMBER MV) inhaler Use as instructed 01/23/16  Yes Javier Glazier, MD  Tiotropium Bromide Monohydrate (SPIRIVA RESPIMAT) 2.5 MCG/ACT AERS Inhale 2 puffs into the lungs daily. 07/16/17  Yes Kozlow, Donnamarie Poag, MD  alum & mag hydroxide-simeth (MAALOX/MYLANTA) 200-200-20 MG/5ML suspension Take 15 mLs by mouth every 4 (four) hours as needed for indigestion or heartburn. 01/23/18   Aline August, MD  EPINEPHrine (EPIPEN 2-PAK) 0.3 mg/0.3 mL IJ SOAJ injection Inject 0.3 mLs (0.3 mg total) into the muscle Once PRN for up to 1 dose. 12/02/17   Juanito Doom, MD   Physical Exam: Vitals:   02/10/18 1300 02/10/18 1330 02/10/18 1430 02/10/18 1705  BP: (!) 145/71 137/60 (!) 148/65 (!) 142/72  Pulse: 79 81 83 90  Resp: 12 (!) 23 14   Temp:      TempSrc:      SpO2: 100% 100% 100% 98%   Constitutional: Cuacasian female inNAD and appears calm and but uncomfortable Eyes: Lids and conjunctivae normal, sclerae anicteric  ENMT: External Ears, Nose appear normal. Grossly normal hearing. Mucous membranes are slightly dry  Neck: Appears normal, supple, no cervical masses, normal ROM, no appreciable thyromegaly; no JVD Respiratory: Diminished to auscultation bilaterally, no wheezing, rales, rhonchi or crackles. Normal respiratory effort and patient is not tachypenic. No  accessory muscle use. Not wearing supplemental O2 via [place] Cardiovascular: RRR, no murmurs / rubs / gallops. S1 and S2 auscultated. 1+ LE Edema Abdomen: Soft, Tender to palpate diffusely, non-distended. No masses palpated. No appreciable hepatosplenomegaly. Bowel sounds positive x4.  GU: Deferred. Musculoskeletal: No clubbing / cyanosis of digits/nails. No joint deformity upper and lower extremities.  Skin: Has a wound/skin tear on Right forearm that is extensive but not warm or painful to touch. No induration; Warm and dry.  Neurologic: CN 2-12 grossly intact with no focal deficits. Romberg sign and cerebellar reflexes not assessed.  Psychiatric: Normal judgment and insight. Alert and oriented x 3. Normal mood and somewhat of a flat affect.   Labs on Admission: I have personally reviewed following labs and imaging studies  CBC: Recent Labs  Lab 02/10/18 0909  WBC 12.9*  NEUTROABS 9.5*  HGB 8.8*  HCT 28.4*  MCV 99.6  PLT 409   Basic Metabolic Panel: Recent Labs  Lab 02/07/18 1156 02/10/18 0909  NA 142 137  K 3.7 3.4*  CL 105 105  CO2 24 20*  GLUCOSE 93 128*  BUN 16 10  CREATININE 1.11* 0.88  CALCIUM 9.3 7.9*   GFR: Estimated Creatinine Clearance: 47.7 mL/min (by C-G formula based on SCr of 0.88 mg/dL). Liver Function Tests: Recent Labs  Lab 02/07/18 1156 02/10/18 0909  AST 18 22  ALT 34* 22  ALKPHOS  --  60  BILITOT 0.3 1.1  PROT 6.0* 6.0*  ALBUMIN  --  2.4*   No results for input(s): LIPASE,  AMYLASE in the last 168 hours. No results for input(s): AMMONIA in the last 168 hours. Coagulation Profile: No results for input(s): INR, PROTIME in the last 168 hours. Cardiac Enzymes: No results for input(s): CKTOTAL, CKMB, CKMBINDEX, TROPONINI in the last 168 hours. BNP (last 3 results) No results for input(s): PROBNP in the last 8760 hours. HbA1C: No results for input(s): HGBA1C in the last 72 hours. CBG: No results for input(s): GLUCAP in the last 168 hours. Lipid  Profile: No results for input(s): CHOL, HDL, LDLCALC, TRIG, CHOLHDL, LDLDIRECT in the last 72 hours. Thyroid Function Tests: No results for input(s): TSH, T4TOTAL, FREET4, T3FREE, THYROIDAB in the last 72 hours. Anemia Panel: No results for input(s): VITAMINB12, FOLATE, FERRITIN, TIBC, IRON, RETICCTPCT in the last 72 hours. Urine analysis:    Component Value Date/Time   COLORURINE YELLOW 01/14/2018 1845   APPEARANCEUR HAZY (A) 01/14/2018 1845   LABSPEC 1.014 01/14/2018 1845   PHURINE 6.0 01/14/2018 1845   GLUCOSEU NEGATIVE 01/14/2018 1845   HGBUR NEGATIVE 01/14/2018 1845   BILIRUBINUR NEGATIVE 01/14/2018 1845   KETONESUR NEGATIVE 01/14/2018 1845   PROTEINUR 30 (A) 01/14/2018 1845   UROBILINOGEN 0.2 05/17/2014 1113   NITRITE NEGATIVE 01/14/2018 1845   LEUKOCYTESUR TRACE (A) 01/14/2018 1845   Sepsis Labs: !!!!!!!!!!!!!!!!!!!!!!!!!!!!!!!!!!!!!!!!!!!! @LABRCNTIP (procalcitonin:4,lacticidven:4) )No results found for this or any previous visit (from the past 240 hour(s)).   Radiological Exams on Admission: Dg Chest 2 View  Result Date: 02/10/2018 CLINICAL DATA:  Fever.  Nausea.  Emesis. EXAM: CHEST - 2 VIEW COMPARISON:  01/17/2018 FINDINGS: The heart size and mediastinal contours are within normal limits. Both lungs are clear. The visualized skeletal structures are unremarkable. IMPRESSION: No active cardiopulmonary disease. Electronically Signed   By: Nelson Chimes M.D.   On: 02/10/2018 10:05    EKG: Independently reviewed.  Showed a sinus rhythm at a rate of 80 and a QTC of 441 with no evidence of ST elevation or depression on my interpretation  Assessment/Plan Active Problems:   Hyperlipidemia   Seasonal and perennial allergic rhinitis   GERD   Irritable bowel syndrome   BREAST CANCER, HX OF   Cough   Acute deep vein thrombosis (DVT) of right lower extremity (HCC)   History of breast cancer   Chronic pain syndrome   Fibromyalgia   Leukocytosis   DVT (deep venous thrombosis)  (HCC)   Depression   Fever   SIRS (systemic inflammatory response syndrome) (HCC)   Nausea and vomiting   Diarrhea   Abdominal pain  SIRS/Fever -Place in Obs Telemetry -He was initially tachypneic, had a leukocytosis and had a reported fever of 103 at home but was afebrile here in the hospital today -Unclear Etiology and Doubt Source of Infection is Right Arm Wound; wound nurse to evaluate right arm wound -Check CT of Chest/Abd/Pelvis as she has a cough and Abdominal Pain -Check Blood Cx's x2, Sputum Cx -Lactic acid level was not elevated at 1.26 -WBC is 12.9 and will continue to monitor and actually down from previous 18.8 -Urinalysis and urine culture still pending -Follow-up all cultures and imaging studies -I discussed the case with infectious diseases doctor, who recommends place the patient on empiric IV Ancef for her arm and working the patient up further.  He does not believe the patient has C. difficile as time even though she had 3-4 bowel movements and a mild leukocytosis.  Fever here is resolved and will continue monitor vital signs per protocol -We will continue monitor very carefully  Normocytic anemia -Has a history of macrocytic anemia in patient's hemoglobin/hematocrit is now 8.8/20.4 -Continue monitor for signs of bleeding as patient is anticoagulated with rivaroxaban -Continue monitor and repeat anemia panel in the a.m. -Repeat CBC in a.m.  Nausea, vomiting, diarrhea associate with abdominal pain -Patient initially denied having abdominal pain but when I palpated she did have significant abdominal pain -Check CT of the abdomen and pelvis -Do not clinically think this is C. difficile as she is only had 3-4 bowel movements and diarrhea started today and fever was 2 days ago. -Check and follow cultures -Discussed with infectious diseases and he does not feel that this is C. difficile added -If necessary will check GI pathogen panel but at this time will hold off from  checking stable C. difficile -If patient becomes more symptomatic or continues to have worsening diarrhea then will consider checking C. difficile via PCR -Continue supportive care with normal saline rate of 75 mL's per hour -In the interim we will hold her GERD medications with ranitidine and pantoprazole -Continue with supportive care and antiemetics  Right forearm wound -Wound nurse evaluating doubt this is the source of her fever.  I do not feel that this is a cellulitis but a skin tear that is not healed very much -has been on antibiotics in outpatient setting with doxycycline -Is not warm but is erythematous and irritated.  Cough -Chest x-ray showed no acute active cardiopulmonary disease -Check CT of the chest without contrast -Placed on duo nebs 3 mils every 6 scheduled and added guaifenesin 1200 mg p.o. twice daily  Hypokalemia -Patient was 3.4 -We will replete with p.o. potassium chloride 40 mEq twice daily x2 doses -Continue to monitor and replete as necessary -Repeat CMP in a.m.  GERD/irritable bowel syndrome -Hold her home Zantac and pantoprazole for now given her diarrhea -Patient is to be continued on her Florastor  Seasonal allergies -Continue loratadine 10 mg p.o. daily and fluticasone  History of DVT -Continue with rivaroxaban  Fibromyalgia/depression/anxiety anxiety -Continue with home medications and pain control -Continue with home mirtazapine  DVT prophylaxis: Anticoagulated with Apixaban Code Status: FULL CODE Family Communication: No family present at bedside Disposition Plan: Anticipate D/C home when improved Consults called: Discussed case with ID Dr. Linus Salmons  Admission status: Obs Telemetry  Severity of Illness: The appropriate patient status for this patient is OBSERVATION. Observation status is judged to be reasonable and necessary in order to provide the required intensity of service to ensure the patient's safety. The patient's presenting  symptoms, physical exam findings, and initial radiographic and laboratory data in the context of their medical condition is felt to place them at decreased risk for further clinical deterioration. Furthermore, it is anticipated that the patient will be medically stable for discharge from the hospital within 2 midnights of admission. The following factors support the patient status of observation.   " The patient's presenting symptoms include Nausea, Vomiting, Fever, Abdominal Pain and Diarrhea today. " The physical exam findings include Slightly dry mucous membranes and a wound on Right Forearm. " The initial radiographic and laboratory data are Reassuring.  Kerney Elbe, D.O. [facility] PAGER is on [place]  If 7PM-7AM, please contact night-coverage [email or web address] Password [email or web address]  02/10/2018, 5:25 PM

## 2018-02-10 NOTE — ED Provider Notes (Signed)
[facility] DEPT Provider Note   CSN: [id number] Arrival date & time: 02/10/18  8413     History   Chief Complaint Chief Complaint  Patient presents with  . Emesis  . Diarrhea  . Fever    HPI Ebony Scott is a 67 y.o. female.  67 year old female with history of multiple medical problems presents with fever for about a week with associated increased cough and congestion.  Patient also being treated for a right forearm cellulitis but denies any new swelling or drainage from that wound.  No abdominal or chest discomfort.  No severe watery diarrhea.  Seen by myself in the past and diagnosed with C. difficile.  EMS was called and patient's temperature was 102.  Was medicated and transported here for further management.     Past Medical History:  Diagnosis Date  . Allergic rhinitis   . Allergy    SEASONAL  . Anemia   . Anxiety    pt denies  . Arthritis    "mostly the hands" (02/20/2012)  . Asthma   . Breast cancer ([place]) 1998   in remission  . Cataract    REMOVED  . Chronic lower back pain   . Chronic neck pain   . Complication of anesthesia    "had hard time waking up from it several times" (02/20/2012)  . Depression    "some; don't take anything for it" (02/20/2012)  . Diverticulosis   . DVT (deep venous thrombosis) ([place])   . Exertional dyspnea   . Fibromyalgia 11/2011  . GERD (gastroesophageal reflux disease)   . Graves disease   . Headache(784.0)    "related to allergies; more at different times during the year" (02/20/2012)  . Hemorrhoids   . Hiatal hernia    back and neck  . Hx of adenomatous colonic polyps 04/12/2016  . Hypercholesteremia    good cholesterol is high  . Hypothyroidism   . IBS (irritable bowel syndrome)   . Moderate persistent asthma    -FeV1 72% 2011, -IgE 102 2011, CT sinus Neg 2011  . Osteoporosis    on reclast yearly  . Pneumonia 04/2011; ~ 11/2011   "double; single" (02/20/2012)    Patient Active Problem  List   Diagnosis Date Noted  . Fracture of base of fifth metatarsal bone with routine healing, left   . Closed fracture of fifth metatarsal bone of right foot, initial encounter 01/15/2018  . Clostridium difficile diarrhea 01/14/2018  . Clostridium difficile colitis 01/14/2018  . DVT (deep venous thrombosis) ([place]) 01/14/2018  . Hypothyroidism 01/14/2018  . Depression 01/14/2018  . Debility   . Acute left ankle pain   . Malnutrition of moderate degree 12/17/2017  . Knee pain   . History of breast cancer   . Chronic pain syndrome   . Fibromyalgia   . Graves disease   . Leukocytosis   . Sepsis due to undetermined organism ([place]) 12/13/2017  . Rhabdomyolysis 12/13/2017  . Acute deep vein thrombosis (DVT) of right lower extremity ([place]) 12/13/2017  . Multiple lung nodules on CT   . CKD (chronic kidney disease), stage III ([place]) 04/15/2016  . Hx of adenomatous colonic polyps 04/12/2016  . Cough 02/09/2016  . Opacity of lung on imaging study 11/03/2015  . Severe persistent asthma 02/08/2015  . Facial pain syndrome 02/08/2015  . Insomnia 02/08/2015  . Other allergic rhinitis 02/08/2015  . LPRD (laryngopharyngeal reflux disease) 02/08/2015  . Chronic diastolic CHF (congestive heart failure) ([place]) 02/08/2015  .  Nausea without vomiting 04/08/2014  . Benign paroxysmal positional vertigo 12/03/2012  . Dizziness and giddiness 10/29/2012  . Pseudogout 02/24/2012  . Pneumonia 05/31/2011  . IRRITABLE BOWEL SYNDROME 01/02/2010  . Hyperlipidemia 12/23/2009  . Hyperthyroidism 11/12/2006  . Seasonal and perennial allergic rhinitis 11/12/2006  . GERD 11/12/2006  . NECK PAIN, CHRONIC 11/12/2006  . OSTEOPOROSIS 11/12/2006  . BREAST CANCER, HX OF 11/12/2006    Past Surgical History:  Procedure Laterality Date  . ANTERIOR AND POSTERIOR REPAIR  1990's  . APPENDECTOMY    . BREAST LUMPECTOMY  1998   left  . CARPOMETACARPEL ([place]) FUSION OF THUMB WITH AUTOGRAFT FROM RADIUS  ~ 2009   "both thumbs"  (02/20/2012)  . CATARACT EXTRACTION W/ INTRAOCULAR LENS  IMPLANT, BILATERAL  2012  . CERVICAL DISCECTOMY  10/2001   C5-C6  . CERVICAL FUSION  2003   C3-C4  . CHOLECYSTECTOMY    . COLONOSCOPY    . DEBRIDEMENT TENNIS ELBOW  ?1970's   right  . ESOPHAGOGASTRODUODENOSCOPY    . HYSTERECTOMY    . KNEE ARTHROPLASTY  ?1990's   "?right; w/cartilage repair" (02/20/2012)  . NASAL SEPTUM SURGERY  1980's  . POSTERIOR CERVICAL FUSION/FORAMINOTOMY  2004   "failed initial fusion; rewired  anterior neck" (02/20/2012)  . TONSILLECTOMY  ~ 1953  . VESICOVAGINAL FISTULA CLOSURE W/ TAH  1988  . VIDEO BRONCHOSCOPY Bilateral 08/23/2016   Procedure: VIDEO BRONCHOSCOPY WITH FLUORO;  Surgeon: Javier Glazier, MD;  Location: [facility];  Service: Cardiopulmonary;  Laterality: Bilateral;     OB History   No obstetric history on file.      Home Medications    Prior to Admission medications   Medication Sig Start Date End Date Taking? Authorizing Provider  albuterol (PROAIR HFA) 108 (90 Base) MCG/ACT inhaler Inhale 2 puffs into the lungs every 4 (four) hours as needed for wheezing or shortness of breath. 02/18/17   [name], MD  Alpha-D-Galactosidase (BEANO PO) Take 1 capsule by mouth 3 (three) times daily as needed (gas/ bloating).     [provider]  Alpha-Lipoic Acid 600 MG CAPS Take 600 mg by mouth daily.     [provider]  alum & mag hydroxide-simeth (MAALOX/MYLANTA) 200-200-20 MG/5ML suspension Take 15 mLs by mouth every 4 (four) hours as needed for indigestion or heartburn. 01/23/18   Aline August, MD  azelastine (ASTELIN) 0.1 % nasal spray USE 2 SPRAYS IN EACH NOSTRIL DAILY AS DIRECTED 07/16/17   Kozlow, Donnamarie Poag, MD  CALCIUM-MAGNESIUM PO Take 1 tablet by mouth at bedtime.    [provider]  cetirizine (ZYRTEC) 10 MG tablet Take 5 mg by mouth every evening.     [provider]  Cholecalciferol (VITAMIN D3) 10000 units TABS Take 1,000 Units by mouth daily.     [provider]  cyproheptadine (PERIACTIN) 4 MG tablet TAKE 2 TABLETS BY MOUTH EVERY EVENING Patient taking differently: TAKE 1 TABLET BY MOUTH EVERY EVENING 07/16/17   Kozlow, Donnamarie Poag, MD  denosumab (PROLIA) 60 MG/ML SOLN injection Inject 60 mg into the skin every 6 (six) months. Administer in upper arm, thigh, or abdomen    [provider]  dexlansoprazole (DEXILANT) 60 MG capsule Take 1 capsule (60 mg total) by mouth daily. 07/16/17   Kozlow, Donnamarie Poag, MD  diclofenac sodium (VOLTAREN) 1 % GEL Apply 2 g topically 2 (two) times daily. 01/23/18   Aline August, MD  doxycycline (VIBRA-TABS) 100 MG tablet Take 1 tablet (100 mg total) by  mouth 2 (two) times daily. 02/07/18   [name], MD  EPINEPHrine (EPIPEN 2-PAK) 0.3 mg/0.3 mL IJ SOAJ injection Inject 0.3 mLs (0.3 mg total) into the muscle Once PRN for up to 1 dose. 12/02/17   Juanito Doom, MD  gabapentin (NEURONTIN) 300 MG capsule Take 600 mg by mouth daily.     [provider]  guaiFENesin (MUCINEX) 600 MG 12 hr tablet Take 600 mg by mouth daily.    [provider]  hydrocortisone (ANUSOL-HC) 2.5 % rectal cream APPLY AS NEEDED AS DIRECTED 06/28/17   [name], MD  Lactase (LACTAID FAST ACT) 9000 units TABS Take 18,000 Units by mouth 4 (four) times daily as needed (dairy consumption).     [provider]  lidocaine (LIDODERM) 5 % Place 1 patch onto the skin daily as needed (pain). Remove & Discard patch within 12 hours or as directed by MD 01/23/18   Aline August, MD  mirtazapine (REMERON SOL-TAB) 30 MG disintegrating tablet DISSOLVE 1 TABLET IN MOUTH EVERY NIGHT AT BEDTIME 01/10/18   Gatha Mayer, MD  mometasone (NASONEX) 50 MCG/ACT nasal spray Place 2 sprays into the nose daily. 01/23/18   Aline August, MD  mupirocin ointment (BACTROBAN) 2 % Place 1 application into the nose at bedtime. To prevent nose bleeds 01/23/18   Aline August, MD  nystatin (MYCOSTATIN) 100000 UNIT/ML  suspension Use as directed 5 mLs (500,000 Units total) in the mouth or throat at bedtime. Swish and spit 01/23/18   Aline August, MD  nystatin-triamcinolone ointment [facility]) Apply 1 application topically 3 (three) times daily as needed (yeast infection).     [provider]  predniSONE (DELTASONE) 10 MG tablet Take 1 tablet (10 mg total) by mouth daily with breakfast. Patient not taking: Reported on 02/07/2018 01/30/18   [name], MD  Prenatal Vit-Sel-Fe Fum-FA (VINATE M) 27-1 MG TABS Take 1 tablet by mouth daily. 07/26/17   [name], MD  PRESCRIPTION MEDICATION Place 2 puffs into both ears once a week. Place capsule in dispenser and instill 2 puffs in each ear once a week after washing hair. CS (X) AHCL    CHLORAMP/SULFU/AMPHO B/ H DROC 100-100-5 mg capsule - compounded at [facility]    [provider]  Probiotic Product (ALIGN) 4 MG CAPS Take 4 mg by mouth every evening.     [provider]  Propylene Glycol-Glycerin (SOOTHE OP) Place 1 drop into both eyes 2 (two) times daily as needed (dry eyes).     [provider]  ranitidine (ZANTAC) 300 MG tablet Take 1 tablet (300 mg total) by mouth every evening. 01/23/18   Aline August, MD  Respiratory Therapy Supplies (FLUTTER) DEVI Use as directed 10/22/14   Elsie Stain, MD  rivaroxaban (XARELTO) 20 MG TABS tablet Take 1 tablet (20 mg total) by mouth daily with supper. 02/07/18   [name], MD  saccharomyces boulardii (FLORASTOR) 250 MG capsule Take 1 capsule (250 mg total) by mouth 2 (two) times daily. 01/24/18   [name], MD  silver sulfADIAZINE (SILVADENE) 1 % cream Apply 1 application topically daily. 02/07/18   [name], MD  SIMPLY SALINE NA Place 1 spray into both nostrils at bedtime.    [provider]  Spacer/Aero-Holding Chambers (AEROCHAMBER MV) inhaler Use as instructed 01/23/16   Javier Glazier, MD  Tiotropium Bromide Monohydrate (SPIRIVA  RESPIMAT) 2.5 MCG/ACT AERS Inhale 2 puffs into the lungs daily. 07/16/17  Kozlow, Donnamarie Poag, MD    Family History Family History  Problem Relation Age of Onset  . Allergies Mother   . Heart disease Mother   . Arthritis Mother   . Lung cancer Mother   . Diabetes Mother   . Allergies Father   . Heart disease Father   . Arthritis Father   . Stroke Father   . Colon cancer Other        Maternal half aunt/Maternal half uncle  . Colitis Daughter   . Diabetes Maternal Grandfather     Social History Social History   Tobacco Use  . Smoking status: Passive Smoke Exposure - Never Smoker  . Smokeless tobacco: Never Used  . Tobacco comment: Parents  Substance Use Topics  . Alcohol use: Yes    Alcohol/week: 0.0 standard drinks    Comment: 02/20/2012 "couple glasses of wine/6 months"  . Drug use: No     Allergies   Dust mite extract; Molds & smuts; Morphine sulfate; Other; Penicillins; Reclast [zoledronic acid]; Rofecoxib; Shrimp flavor; Tetracycline hcl; Xolair [omalizumab]; Dilaudid [hydromorphone hcl]; Hydrocodone-acetaminophen; Levofloxacin; Oxycodone hcl; Paroxetine; Celecoxib; Diltiazem; Lactose intolerance (gi); and Tree extract   Review of Systems Review of Systems  All other systems reviewed and are negative.    Physical Exam Updated Vital Signs BP (!) 143/67 (BP Location: Left Arm)   Pulse 84   Temp 98 F (36.7 C) (Oral)   Resp 18   SpO2 100%   Physical Exam Vitals signs and nursing note reviewed.  Constitutional:      General: She is not in acute distress.    Appearance: Normal appearance. She is well-developed. She is not toxic-appearing.  HENT:     Head: Normocephalic and atraumatic.  Eyes:     General: Lids are normal.     Conjunctiva/sclera: Conjunctivae normal.     Pupils: Pupils are equal, round, and reactive to light.  Neck:     Musculoskeletal: Normal range of motion and neck supple.     Thyroid: No thyroid mass.     Trachea: No tracheal deviation.    Cardiovascular:     Rate and Rhythm: Normal rate and regular rhythm.     Heart sounds: Normal heart sounds. No murmur. No gallop.   Pulmonary:     Effort: Pulmonary effort is normal. No respiratory distress.     Breath sounds: Normal breath sounds. No stridor. No decreased breath sounds, wheezing, rhonchi or rales.  Abdominal:     General: Bowel sounds are normal. There is no distension.     Palpations: Abdomen is soft.     Tenderness: There is no abdominal tenderness. There is no rebound.  Musculoskeletal: Normal range of motion.        General: No tenderness.  Skin:    General: Skin is warm and dry.     Findings: No abrasion or rash.  Neurological:     General: No focal deficit present.     Mental Status: She is alert and oriented to person, place, and time.     GCS: GCS eye subscore is 4. GCS verbal subscore is 5. GCS motor subscore is 6.     Cranial Nerves: No cranial nerve deficit.     Sensory: No sensory deficit.  Psychiatric:        Mood and Affect: Affect is flat.        Speech: Speech normal.        Behavior: Behavior is withdrawn.      ED  Treatments / Results  Labs (all labs ordered are listed, but only abnormal results are displayed) Labs Reviewed  CBC WITH DIFFERENTIAL/PLATELET - Abnormal; Notable for the following components:      Result Value   WBC 12.9 (*)    RBC 2.85 (*)    Hemoglobin 8.8 (*)    HCT 28.4 (*)    RDW 17.4 (*)    Neutro Abs 9.5 (*)    Monocytes Absolute 2.0 (*)    Abs Immature Granulocytes 0.18 (*)    All other components within normal limits  CULTURE, BLOOD (ROUTINE X 2)  CULTURE, BLOOD (ROUTINE X 2)  COMPREHENSIVE METABOLIC PANEL  URINALYSIS, ROUTINE W REFLEX MICROSCOPIC  I-STAT CG4 LACTIC ACID, ED    EKG None  Radiology No results found.  Procedures Procedures (including critical care time)  Medications Ordered in ED Medications  0.9 %  sodium chloride infusion (has no administration in time range)     Initial  Impression / Assessment and Plan / ED Course  I have reviewed the triage vital signs and the nursing notes.  Pertinent labs & imaging results that were available during my care of the patient were reviewed by me and considered in my medical decision making (see chart for details).    Patient started on antibiotics for possible cellulitis in her right forearm.  She was febrile prior to arrival here.  She has history of multiple recent hospitalizations.  Her diarrhea was only x1 and not watery like her prior C. difficile.  Mild leukocytosis of 12.9 noted on her CBC.  Urinalysis is pending and discussed with hospitalist service will come and admit the patient  Final Clinical Impressions(s) / ED Diagnoses   Final diagnoses:  None    ED Discharge Orders    None       Lacretia Leigh, MD 02/10/18 1333

## 2018-02-10 NOTE — ED Notes (Signed)
Bed: QF37 Expected date:  Expected time:  Means of arrival:  Comments: Ems, wound, fever

## 2018-02-10 NOTE — Progress Notes (Signed)
PHARMACIST - PHYSICIAN ORDER COMMUNICATION  CONCERNING: P&T Medication Policy on Herbal Medications  DESCRIPTION:  This patient's order for:  Alpha-lipoic acid  has been noted.  This product(s) is classified as an "herbal" or natural product. Due to a lack of definitive safety studies or FDA approval, nonstandard manufacturing practices, plus the potential risk of unknown drug-drug interactions while on inpatient medications, the Pharmacy and Therapeutics Committee does not permit the use of "herbal" or natural products of this type within [facility].   ACTION TAKEN: The pharmacy department is unable to verify this order at this time. Please reevaluate patient's clinical condition at discharge and address if the herbal or natural product(s) should be resumed at that time.  Lenis Noon, PharmD 02/10/18 5:17 PM

## 2018-02-11 DIAGNOSIS — L03119 Cellulitis of unspecified part of limb: Secondary | ICD-10-CM

## 2018-02-11 DIAGNOSIS — R1084 Generalized abdominal pain: Secondary | ICD-10-CM | POA: Diagnosis not present

## 2018-02-11 LAB — URINALYSIS, COMPLETE (UACMP) WITH MICROSCOPIC
Bilirubin Urine: NEGATIVE
Glucose, UA: NEGATIVE mg/dL
Ketones, ur: 5 mg/dL — AB
Nitrite: NEGATIVE
Protein, ur: NEGATIVE mg/dL
Specific Gravity, Urine: 1.014 (ref 1.005–1.030)
pH: 6 (ref 5.0–8.0)

## 2018-02-11 LAB — COMPREHENSIVE METABOLIC PANEL
ALT: 19 U/L (ref 0–44)
AST: 23 U/L (ref 15–41)
Albumin: 2.2 g/dL — ABNORMAL LOW (ref 3.5–5.0)
Alkaline Phosphatase: 56 U/L (ref 38–126)
Anion gap: 12 (ref 5–15)
BUN: 9 mg/dL (ref 8–23)
CO2: 17 mmol/L — ABNORMAL LOW (ref 22–32)
Calcium: 7.7 mg/dL — ABNORMAL LOW (ref 8.9–10.3)
Chloride: 111 mmol/L (ref 98–111)
Creatinine, Ser: 0.73 mg/dL (ref 0.44–1.00)
GFR calc Af Amer: >60 mL/min (ref >60)
GFR calc non Af Amer: >60 mL/min (ref >60)
Glucose, Bld: 82 mg/dL (ref 70–99)
Potassium: 4.1 mmol/L (ref 3.5–5.1)
Sodium: 140 mmol/L (ref 135–145)
Total Bilirubin: 0.5 mg/dL (ref 0.3–1.2)
Total Protein: 5.7 g/dL — ABNORMAL LOW (ref 6.5–8.1)

## 2018-02-11 LAB — CBC
HCT: 29.2 % — ABNORMAL LOW (ref 36.0–46.0)
Hemoglobin: 8.9 g/dL — ABNORMAL LOW (ref 12.0–15.0)
MCH: 30.8 pg (ref 26.0–34.0)
MCHC: 30.5 g/dL (ref 30.0–36.0)
MCV: 101 fL — ABNORMAL HIGH (ref 80.0–100.0)
Platelets: 373 10*3/uL (ref 150–400)
RBC: 2.89 MIL/uL — ABNORMAL LOW (ref 3.87–5.11)
RDW: 18.1 % — ABNORMAL HIGH (ref 11.5–15.5)
WBC: 11.9 10*3/uL — ABNORMAL HIGH (ref 4.0–10.5)
nRBC: 0 % (ref 0.0–0.2)

## 2018-02-11 LAB — PROCALCITONIN: Procalcitonin: 0.46 ng/mL

## 2018-02-11 LAB — GLUCOSE, CAPILLARY: Glucose-Capillary: 82 mg/dL (ref 70–99)

## 2018-02-11 MED ORDER — GERHARDT'S BUTT CREAM
TOPICAL_CREAM | Freq: Four times a day (QID) | CUTANEOUS | Status: DC
  Administered 2018-02-11 – 2018-02-12 (×4): via TOPICAL
  Filled 2018-02-11: qty 1

## 2018-02-11 MED ORDER — SIMETHICONE 80 MG PO CHEW
80.0000 mg | CHEWABLE_TABLET | Freq: Three times a day (TID) | ORAL | Status: DC | PRN
  Administered 2018-02-12: 80 mg via ORAL
  Filled 2018-02-11: qty 1

## 2018-02-11 MED ORDER — GERHARDT'S BUTT CREAM: 1.0000 "application " | TOPICAL_CREAM | Freq: Four times a day (QID) | CUTANEOUS | 1 refills | Status: DC

## 2018-02-11 MED ORDER — CEPHALEXIN 500 MG PO CAPS: 500.0000 mg | ORAL_CAPSULE | Freq: Four times a day (QID) | ORAL | 0 refills | Status: DC

## 2018-02-11 MED ORDER — DICLOFENAC SODIUM 1 % TD GEL
2.0000 g | Freq: Two times a day (BID) | TRANSDERMAL | Status: DC
  Administered 2018-02-11 – 2018-02-12 (×2): 2 g via TOPICAL
  Filled 2018-02-11: qty 100

## 2018-02-11 MED ORDER — CEPHALEXIN 500 MG PO CAPS
500.0000 mg | ORAL_CAPSULE | Freq: Three times a day (TID) | ORAL | Status: DC
  Administered 2018-02-11 – 2018-02-12 (×4): 500 mg via ORAL
  Filled 2018-02-11 (×4): qty 1

## 2018-02-11 NOTE — Care Management Note (Addendum)
Case Management Note  Patient Details  Name: Ebony Scott MRN: [id number] Date of Birth: June 09, 1950  Subjective/Objective:                  Discharge   Action/Plan: Home with hhc-RN and P.T. has been using [facility] but does not to use them again.  Does want to use [facility]. Made aware of request for referral. Copy of hhc choices given to patient and placed in shadow chart.  Expected Discharge Date:  02/11/18               Expected Discharge Plan:  [place]  In-House Referral:     Discharge planning Services  CM Consult  Post Acute Care Choice:    Choice offered to:     DME Arranged:    DME Agency:     HH Arranged:  RN, PT [place] Agency:  [facility] (now [facility])  Status of Service:  Completed, signed off  If discussed at [place] of Stay Meetings, dates discussed:    Additional Comments:  Leeroy Cha, RN 02/11/2018, 1:24 PM

## 2018-02-11 NOTE — Evaluation (Signed)
Physical Therapy Evaluation Patient Details Name: Ebony Scott MRN: [id number] DOB: 02/10/1951 Today's Date: 02/11/2018   History of Present Illness  67 y.o. female with medical history significant  anxiety and depression, history of a breast cancer , cataract surgery, chronic pain, diverticulosis, fibromyalgia, GERD, Graves' disease, hypercholesterolemia, irritable bowel syndrome, hypothyroidism ,asthma and osteoporosis , history of C. difficile colitis who presented 02/10/18 with  fatigue, and a fever, diarrhea,nausea and vomiting  Clinical Impression  The patient was unable to mobilize to sitting/standing this visit. Attempted various approaches,and assistance  but patient is resistive, siting pain is too much" all over". Patient does have a point of significant tenderness at anterior Left shoulder and does not elevate the arm to head. . RN notified of patient's pain limiting  Mobility. Pt admitted with above diagnosis. Pt currently with functional limitations due to the deficits listed below (see PT Problem List).  Pt will benefit from skilled PT to increase their independence and safety with mobility to allow discharge to the venue listed below.        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 Attempted to sit up. Patient guarding and did not sit  up, more resistive, siting too much pain.  Transfers                 General transfer comment: pain limiting  Ambulation/Gait                Stairs            Wheelchair Mobility    Modified Rankin (Stroke Patients Only)       Balance                                             Pertinent Vitals/Pain Faces Pain Scale: Hurts worst Pain Location: left shoulder mostly, everywhere legs touched.  Pain Descriptors / Indicators: Discomfort;Grimacing;Guarding;Crying;Moaning Pain Intervention(s): Limited activity within patient's tolerance;Monitored during session    Home Living Family/patient expects to be discharged to:: Private residence Living Arrangements: Alone Available Help at Discharge: Family;Available PRN/intermittently Type of Home: House Home Access: Stairs to enter Entrance Stairs-Rails: Left Entrance Stairs-Number of Steps: 14 Home Layout: Two level;Able to live on main level with bedroom/bathroom Home Equipment: Gilford Rile - 2 wheels;Bedside commode;Shower seat - built in;Grab bars - tub/shower Additional Comments: daughter  to come in to later today, unsure of 24/7 caregiviers    Prior Function                 Hand Dominance   Dominant Hand: Right    Extremity/Trunk Assessment   Upper Extremity Assessment Upper Extremity Assessment: LUE deficits/detail RUE Deficits / Details: decreased tolerance to palpate anterior shoulder, slight glenoid humeral gap. unable to actively elevate arm  greater than 45 degr- pain limiting?    Lower Extremity Assessment Lower Extremity Assessment: RLE deficits/detail;LLE deficits/detail RLE: Unable to fully assess due to pain LLE: Unable to fully assess due to pain       Communication   Communication: No difficulties  Cognition Arousal/Alertness: Awake/alert Behavior During Therapy: Flat affect Overall Cognitive Status: Within Functional Limits for tasks assessed Area of  Impairment: Orientation                 Orientation Level: Time             General Comments: pt stated" Not sure"      General Comments      Exercises     Assessment/Plan    PT Assessment Patient needs continued PT services  PT Problem List Decreased strength;Decreased range of motion;Decreased activity tolerance;Decreased knowledge of use of DME;Decreased safety awareness;Decreased mobility;Decreased knowledge of precautions;Pain       PT Treatment Interventions DME instruction;Gait training;Functional mobility training;Stair training;Therapeutic activities;Patient/family education    PT Goals (Current goals can be found in the Care Plan section)  Acute Rehab PT Goals Patient Stated Goal: to go home PT Goal Formulation: With patient Time For Goal Achievement: 02/25/18 Potential to Achieve Goals: Fair    Frequency Min 2X/week   Barriers to discharge Decreased caregiver support;Inaccessible home environment      Co-evaluation               AM-PAC PT "6 Clicks" Mobility  Outcome Measure Help needed turning from your back to your side while in a flat bed without using bedrails?: Total Help needed moving from lying on your back to sitting on the side of a flat bed without using bedrails?: Total Help needed moving to and from a bed to a chair (including a wheelchair)?: Total Help needed standing up from a chair using your arms (e.g., wheelchair or bedside chair)?: Total Help needed to walk in hospital room?: Total Help needed climbing 3-5 steps with a railing? : Total 6 Click Score: 6    End of Session   Activity Tolerance: Patient limited by pain Patient left: in bed;with call bell/phone within reach;with bed alarm set Nurse Communication: Mobility status PT Visit Diagnosis: Difficulty in walking, not elsewhere classified (R26.2);Pain;Other abnormalities of gait and mobility (R26.89);Muscle weakness (generalized) (M62.81) Pain - Right/Left:  Left Pain - part of body: Shoulder    Time: 6060-0459 PT Time Calculation (min) (ACUTE ONLY): 29 min   Charges:   PT Evaluation $PT Eval Low Complexity: 1 Low PT Treatments $Therapeutic Activity: 8-22 mins        Tresa Endo PT Acute Rehabilitation Services Pager [phone] Office [phone]   Claretha Cooper 02/11/2018, 11:55 AM

## 2018-02-11 NOTE — Progress Notes (Signed)
This shift pt had no N/V/D

## 2018-02-11 NOTE — Care Management Obs Status (Signed)
MEDICARE OBSERVATION STATUS NOTIFICATION   Patient Details  Name: Ebony Scott MRN: [id number] Date of Birth: 02-15-1951   Medicare Observation Status Notification Given:  Yes    Leeroy Cha, RN 02/11/2018, 11:27 AM

## 2018-02-11 NOTE — Care Management Note (Signed)
Case Management Note  Patient Details  Name: Ebony Scott MRN: [id number] Date of Birth: September 15, 1950  Subjective/Objective:                  discharged  Action/Plan: To home with self care  Expected Discharge Date:  02/11/18               Expected Discharge Plan:  Home/Self Care  In-House Referral:     Discharge planning Services  CM Consult  Post Acute Care Choice:    Choice offered to:     DME Arranged:    DME Agency:     HH Arranged:    HH Agency:     Status of Service:  Completed, signed off  If discussed at [employer] of Stay Meetings, dates discussed:    Additional Comments:  Leeroy Cha, RN 02/11/2018, 10:50 AM

## 2018-02-11 NOTE — Discharge Summary (Addendum)
Physician Discharge Summary  Ebony Scott OEU:[id number] DOB: Nov 07, 1950 DOA: 02/10/2018  PCP: [name], MD  Admit date: 02/10/2018 Discharge date: 02/12/18 Admitted From: Home Disposition: Home Recommendations for Outpatient Follow-up:  1. Follow up with PCP in 1-2 weeks 2. Please obtain BMP/CBC in one week   Home Health yes Equipment/Devices none Discharge Condition stable CODE STATUS full code Diet recommendation: Cardiac Brief/Interim Summary:67 y.o. female with medical history significant of above medical comorbidities including allergic rhinitis, anxiety and depression, history of a breast cancer in remission, history of cataract surgery, chronic pain, diverticulosis, fibromyalgia, GERD, Graves' disease, hypercholesterolemia, irritable bowel syndrome, hypothyroidism moderate persistent asthma and osteoporosis along with history of C. difficile colitis who presented today with a history of fatigue, and a fever of 103 with associated nausea and vomiting.  States that she had a fever of 103 2 days ago and started having nausea or vomiting.  Constantly coughing up and more so lately. States she stays cold and had a history of a blood clot in her right leg into the lungs.  She has been taking antibiotics in the outpatient setting for her right forearm wound.  No nausea last couple days but started today.  States she is limping a lot of coughing but denies any chest pain.  Does admit to having some shortness of breath and did admit to having some loose watery diarrhea have been 3-4 times a day.  Has had C. difficile in the past and had formed movements the last 2 to 3 weeks but had loose bowel movements today and does not know if it is her irritable bowel syndrome.  Because of her fever her son brought her in to the emergency room for further evaluation recommendations and patient was called to admit this patient for fever, diarrhea and emesis associate with abdominal pain.  Gust the case  with Dr. Linus Salmons of infectious diseases and we have low suspicion for C. difficile as of now and recommended not starting empiric treatment but obtaining further work-up  ED Course: In the ED patient basic blood work done as well as blood cultures and urinalysis.  She is given a liter of normal saline.  Also given IV vancomycin for her Right forearm.   Discharge Diagnoses:  Active Problems:   Hyperlipidemia   Seasonal and perennial allergic rhinitis   GERD   Irritable bowel syndrome   BREAST CANCER, HX OF   Cough   Acute deep vein thrombosis (DVT) of right lower extremity (HCC)   History of breast cancer   Chronic pain syndrome   Fibromyalgia   Leukocytosis   DVT (deep venous thrombosis) (HCC)   Depression   Fever   SIRS (systemic inflammatory response syndrome) (HCC)   Nausea and vomiting   Diarrhea   Abdominal pain  #1 fever patient admitted with fever at home but has not had any fever since admission.  Work-up included CT of the chest abdomen pelvis blood cultures chest x-ray and UA which showed no evidence of infection.  However she received Ancef overnight empirically for possible right upper extremity cellulitis may be.  She remained afebrile with no other complaints.  No nausea vomiting diarrhea.  Patient has been seen by wound care team and is recommended Xeroform nonadherent dressing which also has topical antibacterial property daily upon discharge.  Gerharts butt cream to be applied incontinence associated dermatitis.  Patient was seen by physical therapy and she refused to go to SNF she will be discharged home with home  PT. daughter reports that patient was on prednisone prior to admission to the hospital.  When she was on prednisone she feels really good and when she is gets off prednisone she starts going or feeling bad.  I discussed with infectious disease on-call since there is no evidence of ongoing active bacterial infection present at this time will discharge patient.  She  will be prescribed prednisone 10 mg daily for 2 weeks. She will need a referral from her PCP to go see rheumatologist for fibromyalgia/chronic pain and to rule out any underlying connective tissue disorder.  #2 history of DVT continue Xarelto  #3 depression anxiety and fibromyalgia continue home medications.   Estimated body mass index is 21.25 kg/m as calculated from the following:   Height as of 02/07/18: 5\' 2"  (1.575 m).   Weight as of this encounter: 52.7 kg.  Discharge Instructions  Discharge Instructions    Call MD for:  difficulty breathing, headache or visual disturbances   Complete by:  As directed    Call MD for:  persistant dizziness or light-headedness   Complete by:  As directed    Call MD for:  persistant dizziness or light-headedness   Complete by:  As directed    Call MD for:  persistant nausea and vomiting   Complete by:  As directed    Call MD for:  persistant nausea and vomiting   Complete by:  As directed    Call MD for:  redness, tenderness, or signs of infection (pain, swelling, redness, odor or green/yellow discharge around incision site)   Complete by:  As directed    Call MD for:  temperature >100.4   Complete by:  As directed    Call MD for:  temperature >100.4   Complete by:  As directed    Diet - low sodium heart healthy   Complete by:  As directed    Diet - low sodium heart healthy   Complete by:  As directed    Increase activity slowly   Complete by:  As directed    Increase activity slowly   Complete by:  As directed      Allergies as of 02/11/2018      Reactions   Dust Mite Extract Shortness Of Breath, Other (See Comments)   "sneezing" (02/20/2012)   Molds & Smuts Shortness Of Breath   Morphine Sulfate Itching   Other Shortness Of Breath, Other (See Comments)   Grass and weeds "sneezing; filled sinuses" (02/20/2012)   Penicillins Rash, Other (See Comments)   "welts" (02/20/2012) Has patient had a PCN reaction causing immediate rash,  facial/tongue/throat swelling, SOB or lightheadedness with hypotension: Unknown Has patient had a PCN reaction causing severe rash involving mucus membranes or skin necrosis: No Has patient had a PCN reaction that required hospitalization: No Has patient had a PCN reaction occurring within the last 10 years: No If all of the above answers are "NO", then may proceed with Cephalosporin use.   Reclast [zoledronic Acid] Other (See Comments)   Fever, Put in hospital, dr said it was a reaction from a reaction    Rofecoxib Swelling   Vioxx REACTION: feet swelling   Shrimp Flavor Anaphylaxis   ALL SHELLFISH   Tetracycline Hcl Nausea And Vomiting   Xolair [omalizumab] Other (See Comments)   Caused Blood clot   Dilaudid [hydromorphone Hcl] Itching   Hydrocodone-acetaminophen Nausea And Vomiting   Levofloxacin Other (See Comments)   REACTION: GI upset   Oxycodone Hcl Nausea And Vomiting  Paroxetine Nausea And Vomiting   Paxil   Celecoxib Swelling   Feet swelling   Diltiazem Swelling   Lactose Intolerance (gi) Other (See Comments)   Bloating and gas   Tree Extract Other (See Comments)   "tested and told I was allergic to it; never experienced a reaction to it" (02/20/2012)      Medication List    STOP taking these medications   doxycycline 100 MG tablet Commonly known as:  VIBRA-TABS     TAKE these medications   AEROCHAMBER MV inhaler Use as instructed   albuterol 108 (90 Base) MCG/ACT inhaler Commonly known as:  PROAIR HFA Inhale 2 puffs into the lungs every 4 (four) hours as needed for wheezing or shortness of breath.   ALIGN 4 MG Caps Take 4 mg by mouth every evening.   Alpha-Lipoic Acid 600 MG Caps Take 600 mg by mouth daily.   alum & mag hydroxide-simeth 200-200-20 MG/5ML suspension Commonly known as:  MAALOX/MYLANTA Take 15 mLs by mouth every 4 (four) hours as needed for indigestion or heartburn.   azelastine 0.1 % nasal spray Commonly known as:  ASTELIN USE 2  SPRAYS IN EACH NOSTRIL DAILY AS DIRECTED   BEANO PO Take 1 capsule by mouth 3 (three) times daily as needed (gas/ bloating).   CALCIUM-MAGNESIUM PO Take 1 tablet by mouth at bedtime.   cephALEXin 500 MG capsule Commonly known as:  KEFLEX Take 1 capsule (500 mg total) by mouth 4 (four) times daily for 4 days.   cetirizine 10 MG tablet Commonly known as:  ZYRTEC Take 5 mg by mouth every evening.   cholecalciferol 25 MCG (1000 UT) tablet Commonly known as:  VITAMIN D Take 1,000 Units by mouth daily.   cyproheptadine 4 MG tablet Commonly known as:  PERIACTIN TAKE 2 TABLETS BY MOUTH EVERY EVENING What changed:  additional instructions   denosumab 60 MG/ML Soln injection Commonly known as:  PROLIA Inject 60 mg into the skin every 6 (six) months. Administer in upper arm, thigh, or abdomen   dexlansoprazole 60 MG capsule Commonly known as:  DEXILANT Take 1 capsule (60 mg total) by mouth daily.   diclofenac sodium 1 % Gel Commonly known as:  VOLTAREN Apply 2 g topically 2 (two) times daily.   EPINEPHrine 0.3 mg/0.3 mL Soaj injection Commonly known as:  EPIPEN 2-PAK Inject 0.3 mLs (0.3 mg total) into the muscle Once PRN for up to 1 dose.   FLUTTER Devi Use as directed   gabapentin 300 MG capsule Commonly known as:  NEURONTIN Take 600 mg by mouth daily.   Gerhardt's butt cream Crea Apply 1 application topically 4 (four) times daily.   LACTAID FAST ACT 9000 units Tabs Generic drug:  Lactase Take 18,000 Units by mouth 4 (four) times daily as needed (dairy consumption).   lidocaine 5 % Commonly known as:  LIDODERM Place 1 patch onto the skin daily as needed (pain). Remove & Discard patch within 12 hours or as directed by MD   mirtazapine 30 MG disintegrating tablet Commonly known as:  REMERON SOL-TAB DISSOLVE 1 TABLET IN MOUTH EVERY NIGHT AT BEDTIME   mometasone 50 MCG/ACT nasal spray Commonly known as:  NASONEX Place 2 sprays into the nose daily.   mupirocin  ointment 2 % Commonly known as:  BACTROBAN Place 1 application into the nose at bedtime. To prevent nose bleeds   nystatin 100000 UNIT/ML suspension Commonly known as:  MYCOSTATIN Use as directed 5 mLs (500,000 Units total) in the  mouth or throat at bedtime. Swish and spit   PRESCRIPTION MEDICATION Place 2 puffs into both ears once a week. Place capsule in dispenser and instill 2 puffs in each ear once a week after washing hair. CS (X) AHCL    CHLORAMP/SULFU/AMPHO B/ H DROC 100-100-5 mg capsule - compounded at [facility]   ranitidine 300 MG tablet Commonly known as:  ZANTAC Take 1 tablet (300 mg total) by mouth every evening.   rivaroxaban 20 MG Tabs tablet Commonly known as:  XARELTO Take 1 tablet (20 mg total) by mouth daily with supper.   saccharomyces boulardii 250 MG capsule Commonly known as:  FLORASTOR Take 1 capsule (250 mg total) by mouth 2 (two) times daily.   silver sulfADIAZINE 1 % cream Commonly known as:  SILVADENE Apply 1 application topically daily.   SOOTHE OP Place 1 drop into both eyes 2 (two) times daily as needed (dry eyes).   Tiotropium Bromide Monohydrate 2.5 MCG/ACT Aers Commonly known as:  SPIRIVA RESPIMAT Inhale 2 puffs into the lungs daily.      Follow-up Information    [name], MD Follow up.   Specialty:  Family Medicine Contact information: [address] [place] [address] [phone]        Buford Dresser, MD .   Specialty:  Cardiology Contact information: [address] [place] [address] [phone]          Allergies  Allergen Reactions  . Dust Mite Extract Shortness Of Breath and Other (See Comments)    "sneezing" (02/20/2012)  . Molds & Smuts Shortness Of Breath  . Morphine Sulfate Itching  . Other Shortness Of Breath and Other (See Comments)    Grass and weeds "sneezing; filled sinuses" (02/20/2012)  . Penicillins Rash and Other (See Comments)    "welts" (02/20/2012) Has  patient had a PCN reaction causing immediate rash, facial/tongue/throat swelling, SOB or lightheadedness with hypotension: Unknown Has patient had a PCN reaction causing severe rash involving mucus membranes or skin necrosis: No Has patient had a PCN reaction that required hospitalization: No Has patient had a PCN reaction occurring within the last 10 years: No If all of the above answers are "NO", then may proceed with Cephalosporin use.   Marland Kitchen Reclast [Zoledronic Acid] Other (See Comments)    Fever, Put in hospital, dr said it was a reaction from a reaction   . Rofecoxib Swelling    Vioxx REACTION: feet swelling  . Shrimp Flavor Anaphylaxis    ALL SHELLFISH  . Tetracycline Hcl Nausea And Vomiting  . Xolair [Omalizumab] Other (See Comments)    Caused Blood clot  . Dilaudid [Hydromorphone Hcl] Itching  . Hydrocodone-Acetaminophen Nausea And Vomiting  . Levofloxacin Other (See Comments)    REACTION: GI upset  . Oxycodone Hcl Nausea And Vomiting  . Paroxetine Nausea And Vomiting    Paxil   . Celecoxib Swelling    Feet swelling  . Diltiazem Swelling  . Lactose Intolerance (Gi) Other (See Comments)    Bloating and gas  . Tree Extract Other (See Comments)    "tested and told I was allergic to it; never experienced a reaction to it" (02/20/2012)    Consultations: Wound care   Procedures/Studies: Ct Abdomen Pelvis Wo Contrast  Result Date: 02/10/2018 CLINICAL DATA:  Fever, nausea/vomiting. Cough. History of right leg DVT and PE. History of breast cancer in remission. History of C diff. EXAM: CT CHEST, ABDOMEN AND PELVIS WITHOUT CONTRAST TECHNIQUE: Multidetector  CT imaging of the chest, abdomen and pelvis was performed following the standard protocol without IV contrast. COMPARISON:  CT abdomen/pelvis dated 01/15/2018. CTA chest dated 12/13/2017. FINDINGS: CT CHEST FINDINGS Cardiovascular: The heart is normal in size. No pericardial effusion. No evidence thoracic aortic aneurysm.  Atherosclerotic calcifications of the aortic arch. Coronary atherosclerosis the LAD and left coronary artery. Mediastinum/Nodes: No suspicious mediastinal lymphadenopathy. Lungs/Pleura: Minimal dependent atelectasis in the posterior right upper and bilateral lower lobes. Mild scarring/atelectasis in the inferior right middle lobe and lingula. No focal consolidation. No suspicious pulmonary nodules. 4 mm triangular subpleural nodule in the lateral left lung base (series 4/image 102), unchanged from 2018, benign. No pleural effusion or pneumothorax. Musculoskeletal: Visualized osseous structures are within normal limits. CT ABDOMEN PELVIS FINDINGS Hepatobiliary: Unenhanced liver is unremarkable. Status post cholecystectomy. No intrahepatic or extrahepatic ductal dilatation. Pancreas: Within normal limits. Spleen: Within normal limits. Adrenals/Urinary Tract: Adrenal glands are within normal limits. Kidneys are grossly unremarkable. No renal, ureteral, or bladder calculi. No hydronephrosis. Bladder is within normal limits. Stomach/Bowel: Stomach is within normal limits. No evidence of bowel obstruction. Normal appendix (series 2/image 91). No colonic wall thickening or inflammatory changes. Vascular/Lymphatic: No evidence of abdominal aortic aneurysm. Atherosclerotic calcifications of the abdominal aorta and branch vessels. No suspicious abdominopelvic lymphadenopathy. Reproductive: Status post hysterectomy. Ovaries within normal limits.  No right adnexal mass. Other: No abdominopelvic ascites. Musculoskeletal: Avascular necrosis of the bilateral femoral heads, better evaluated on recent prior MRI. Mild degenerative changes at L4-5. IMPRESSION: No colonic wall thickening or inflammatory changes to suggest infectious/inflammatory colitis. No CT findings to account for the patient's fever. Additional ancillary findings as above. Electronically Signed   By: Julian Hy M.D.   On: 02/10/2018 19:40   Dg Chest 2  View  Result Date: 02/10/2018 CLINICAL DATA:  Fever.  Nausea.  Emesis. EXAM: CHEST - 2 VIEW COMPARISON:  01/17/2018 FINDINGS: The heart size and mediastinal contours are within normal limits. Both lungs are clear. The visualized skeletal structures are unremarkable. IMPRESSION: No active cardiopulmonary disease. Electronically Signed   By: Nelson Chimes M.D.   On: 02/10/2018 10:05   Dg Chest 2 View  Result Date: 01/14/2018 CLINICAL DATA:  Weakness over the last 4 days, some shortness of breath, history of breast carcinoma diagnosed in 1998 EXAM: CHEST - 2 VIEW COMPARISON:  Chest x-ray 12/17/2017 FINDINGS: No active infiltrate is seen and there is no evidence of pleural effusion. Mediastinal and hilar contours are unremarkable. Mild cardiomegaly is stable. No acute bony abnormality is seen. IMPRESSION: Stable mild cardiomegaly.  No active lung disease. Electronically Signed   By: Ivar Drape M.D.   On: 01/14/2018 17:09   Dg Forearm Right  Result Date: 02/10/2018 CLINICAL DATA:  Fall, skin abrasion/laceration, now with redness/swelling, possible cellulitis EXAM: RIGHT FOREARM - 2 VIEW COMPARISON:  None. FINDINGS: No fracture or dislocation is seen. The joint spaces are preserved. Visualized soft tissues are within normal limits. Postsurgical changes at the elbow. No radiopaque foreign body is seen. IMPRESSION: No fracture, dislocation, or radiopaque foreign body is seen. Electronically Signed   By: Julian Hy M.D.   On: 02/10/2018 17:55   Dg Arthro Hip Left  Result Date: 01/18/2018 INDICATION: Left hip joint effusion.  Fever.  Chills. EXAM: ARTHROCENTESIS OF LEFT HIP JOINT UNDER FLUOROSCOPIC GUIDANCE COMPARISON:  None. FLUOROSCOPY TIME:  Fluoroscopy Time:  12 seconds Radiation Exposure Index (if provided by the fluoroscopic device): 1 mGy Number of Acquired Spot Images: 0 COMPLICATIONS: None immediate. PROCEDURE:  Informed written consent was obtained from the patient after discussion of the  risks, benefits and alternatives to treatment. The patient was placed prone on the fluoroscopy table and the left extremity was placed in a slight degree of flexion and internal rotation. The left hip was localized with fluoroscopy. The skin overlying the anterior aspect of the hip was prepped and draped in usual sterile fashion. A 22 gauge spinal needle was advanced into the hip joint at the lateral aspect of the femoral head-neck junction, after the overlying soft tissues were anesthetized with 1% lidocaine. A fluoroscopic image was saved and sent to PACs. Two drops of clear yellow fluid was aspirated from the left hip joint. The aspirated fluid was sent for laboratory evaluation ordered by referring physician. The needle was removed and a dressing was placed. FINDINGS: The image documents needle placement in the left hip joint. IMPRESSION: Successful fluoroscopic guided aspiration of the left hip. Electronically Signed   By: Marybelle Killings M.D.   On: 01/18/2018 10:31   Dg Ankle 2 Views Right  Result Date: 01/14/2018 CLINICAL DATA:  Right foot and ankle pain. EXAM: RIGHT ANKLE - 2 VIEW COMPARISON:  Ankle radiograph 06/10/2012 FINDINGS: There is no evidence of fracture or dislocation. Ankle mortise is preserved. Mild talonavicular degenerative change. Plantar calcaneal spur and Achilles tendon enthesophyte. No bony destructive change or periosteal reaction. Diffuse soft tissue edema about the ankle. Increased density anteriorly may be generalized soft tissue edema or small effusion. IMPRESSION: 1. Diffuse soft tissue edema about the ankle. Focal soft tissue density anteriorly may be joint effusion or more generalized subcutaneous edema. 2. No acute osseous abnormality. Electronically Signed   By: Keith Rake M.D.   On: 01/14/2018 22:39   Dg Ankle Complete Left  Result Date: 01/14/2018 CLINICAL DATA:  Weakness over 4 days, left foot and ankle pain EXAM: LEFT ANKLE COMPLETE - 3+ VIEW COMPARISON:  Left  ankle films of 12/16/2016 FINDINGS: The left ankle joint appears normal. Alignment is normal. No acute abnormality is seen. Again the nonunited fracture through the base of the left fifth metatarsal is present. IMPRESSION: Negative left ankle. Ninety night and fracture through the base of the left fifth metatarsal. Electronically Signed   By: Ivar Drape M.D.   On: 01/14/2018 17:12   Ct Chest Wo Contrast  Result Date: 02/10/2018 CLINICAL DATA:  Fever, nausea/vomiting. Cough. History of right leg DVT and PE. History of breast cancer in remission. History of C diff. EXAM: CT CHEST, ABDOMEN AND PELVIS WITHOUT CONTRAST TECHNIQUE: Multidetector CT imaging of the chest, abdomen and pelvis was performed following the standard protocol without IV contrast. COMPARISON:  CT abdomen/pelvis dated 01/15/2018. CTA chest dated 12/13/2017. FINDINGS: CT CHEST FINDINGS Cardiovascular: The heart is normal in size. No pericardial effusion. No evidence thoracic aortic aneurysm. Atherosclerotic calcifications of the aortic arch. Coronary atherosclerosis the LAD and left coronary artery. Mediastinum/Nodes: No suspicious mediastinal lymphadenopathy. Lungs/Pleura: Minimal dependent atelectasis in the posterior right upper and bilateral lower lobes. Mild scarring/atelectasis in the inferior right middle lobe and lingula. No focal consolidation. No suspicious pulmonary nodules. 4 mm triangular subpleural nodule in the lateral left lung base (series 4/image 102), unchanged from 2018, benign. No pleural effusion or pneumothorax. Musculoskeletal: Visualized osseous structures are within normal limits. CT ABDOMEN PELVIS FINDINGS Hepatobiliary: Unenhanced liver is unremarkable. Status post cholecystectomy. No intrahepatic or extrahepatic ductal dilatation. Pancreas: Within normal limits. Spleen: Within normal limits. Adrenals/Urinary Tract: Adrenal glands are within normal limits. Kidneys are grossly unremarkable. No  renal, ureteral, or  bladder calculi. No hydronephrosis. Bladder is within normal limits. Stomach/Bowel: Stomach is within normal limits. No evidence of bowel obstruction. Normal appendix (series 2/image 91). No colonic wall thickening or inflammatory changes. Vascular/Lymphatic: No evidence of abdominal aortic aneurysm. Atherosclerotic calcifications of the abdominal aorta and branch vessels. No suspicious abdominopelvic lymphadenopathy. Reproductive: Status post hysterectomy. Ovaries within normal limits.  No right adnexal mass. Other: No abdominopelvic ascites. Musculoskeletal: Avascular necrosis of the bilateral femoral heads, better evaluated on recent prior MRI. Mild degenerative changes at L4-5. IMPRESSION: No colonic wall thickening or inflammatory changes to suggest infectious/inflammatory colitis. No CT findings to account for the patient's fever. Additional ancillary findings as above. Electronically Signed   By: Julian Hy M.D.   On: 02/10/2018 19:40   Ct Abdomen Pelvis W Contrast  Result Date: 01/15/2018 CLINICAL DATA:  nausea vomiting and profuse diarrhea. Stool studies were done. C. difficile antigen was positive. Toxin negative. PCR positive. GI pathogen panel pending. Patient with abdominal tenderness on examination. Her WBC is normal. She was febrile last night. EXAM: CT ABDOMEN AND PELVIS WITH CONTRAST TECHNIQUE: Multidetector CT imaging of the abdomen and pelvis was performed using the standard protocol following bolus administration of intravenous contrast. CONTRAST:  18mL OMNIPAQUE IOHEXOL 300 MG/ML SOLN, 73mL OMNIPAQUE IOHEXOL 300 MG/ML SOLN COMPARISON:  03/31/2007 FINDINGS: Lower chest: Scattered coronary calcifications. No pleural or pericardial effusion. Subpleural linear scarring or atelectasis posteriorly in both lower lobes. Hepatobiliary: No focal liver abnormality is seen. Status post cholecystectomy. No biliary dilatation. Pancreas: Unremarkable. No pancreatic ductal dilatation or surrounding  inflammatory changes. Spleen: Normal in size without focal abnormality. Adrenals/Urinary Tract: Normal adrenals. No renal mass or hydronephrosis. No ureterectasis. Urinary bladder physiologically distended. Stomach/Bowel: Stomach and small bowel decompressed. Appendix surgically absent. The colon is nondilated, unremarkable. Vascular/Lymphatic: Scattered aortoiliac calcified plaque without aneurysm or stenosis. Portal vein patent. No abdominal or pelvic adenopathy. Reproductive: Status post hysterectomy. No adnexal masses. Other: No ascites.  No free air. Musculoskeletal: Mild spondylitic changes in the lower lumbar spine. No fracture or other acute bone abnormality. IMPRESSION: 1. No acute abdominal findings. 2. Coronary and aortoiliac  atherosclerosis (ICD10-170.0). Electronically Signed   By: Lucrezia Europe M.D.   On: 01/15/2018 17:17   Mr Hip Left Wo Contrast  Result Date: 01/17/2018 CLINICAL DATA:  Left hip pain.  No trauma. EXAM: MR OF THE LEFT HIP WITHOUT CONTRAST TECHNIQUE: Multiplanar, multisequence MR imaging was performed. No intravenous contrast was administered. COMPARISON:  Left hip x-rays dated January 15, 2018. FINDINGS: Bones: Greater than right serpiginous signal changes within the left femoral heads, consistent with avascular necrosis. No significant surrounding marrow edema. No fracture or dislocation. No focal bone lesion. The visualized sacroiliac joints and symphysis pubis appear normal. Articular cartilage and labrum Articular cartilage: No focal chondral defect or subchondral signal abnormality identified. Labrum: There is no gross labral tear or paralabral abnormality. Joint or bursal effusion Joint effusion: Small left hip joint effusion. No right hip joint effusion. Bursae: No focal periarticular fluid collection. Muscles and tendons Muscles and tendons: Partial tear of the left gluteus minimus tendon. Mild bilateral gluteus medius tendinosis. Moderate bilateral hamstring tendinosis.  The iliopsoas tendons are unremarkable. Prominent edema within the left gluteus minimus muscle. Mild edema within the bilateral adductor musculature and inferior left gluteus medius muscle. No muscle atrophy. Other findings Miscellaneous: The visualized internal pelvic contents appear unremarkable. IMPRESSION: 1. 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 2. Small left hip  joint effusion, nonspecific. 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 If there is clinical concern for septic arthritis, arthrocentesis can be performed. 3. Partial tear of the left gluteus minimus tendon with significant muscle strain. 4. Mild bilateral gluteus medius and moderate bilateral hamstring tendinosis. Electronically Signed   By: Titus Dubin M.D.   On: 01/17/2018 13:19   Dg Chest Port 1 View  Result Date: 01/17/2018 CLINICAL DATA:  Cough. EXAM: PORTABLE CHEST 1 VIEW COMPARISON:  Chest x-ray dated January 14, 2018. FINDINGS: The heart size and mediastinal contours are within normal limits. Normal pulmonary vascularity. No focal consolidation, pleural effusion, or pneumothorax. No acute osseous abnormality. Unchanged surgical clips in the left breast. IMPRESSION: No active disease. Electronically Signed   By: Titus Dubin M.D.   On: 01/17/2018 09:55   Dg Foot 2 Views Right  Result Date: 01/14/2018 CLINICAL DATA:  Right foot and ankle pain. EXAM: RIGHT FOOT - 2 VIEW COMPARISON:  Foot radiograph 02/20/2012 FINDINGS: Suspected incomplete transverse fracture of the proximal fifth metatarsal about the lateral cortex. No other fracture. Small osseous density about delay lateral cuboid is likely an accessory ossicle. Minimal hallux valgus, alignment is otherwise maintained. No periosteal reaction or bony destructive change. Plantar calcaneal spur and Achilles tendon  enthesophyte. Diffuse soft tissue edema. IMPRESSION: Suspected incomplete transverse fracture of the proximal fifth metatarsal shaft. Electronically Signed   By: Keith Rake M.D.   On: 01/14/2018 22:42   Dg Foot Complete Left  Result Date: 01/14/2018 CLINICAL DATA:  Weakness over the last 4 days, left foot and ankle pain, history of breast carcinoma diagnosed in 1998 EXAM: LEFT FOOT - COMPLETE 3+ VIEW COMPARISON:  Left foot films of 11/06/2016 FINDINGS: There is incomplete healing of the previously noted transverse fracture through the base of the left fifth metatarsal. The fracture line is still somewhat visible. No acute fracture is seen. Alignment is normal. No erosion is seen. IMPRESSION: Incomplete healing of the previously diagnosed transverse fracture through the base of the left fifth metatarsal. Electronically Signed   By: Ivar Drape M.D.   On: 01/14/2018 17:10   Dg Hip Port Unilat With Pelvis 1v Left  Result Date: 01/15/2018 CLINICAL DATA:  Left hip pain EXAM: DG HIP (WITH OR WITHOUT PELVIS) 1V PORT LEFT COMPARISON:  None. FINDINGS: Early spurring in the hip joints bilaterally. SI joints are symmetric and unremarkable. No acute bony abnormality. Specifically, no fracture, subluxation, or dislocation. IMPRESSION: Early spurring in the hip joints bilaterally. No acute bony abnormality. Electronically Signed   By: Rolm Baptise M.D.   On: 01/15/2018 11:07    (Echo, Carotid, EGD, Colonoscopy, ERCP)    Subjective:   Discharge Exam: Vitals:   02/11/18 0425 02/11/18 0931  BP: (!) 148/77   Pulse: 86   Resp: 16   Temp: 98.4 F (36.9 C)   SpO2: 98% 97%   Vitals:   02/10/18 2204 02/10/18 2310 02/11/18 0425 02/11/18 0931  BP:   (!) 148/77   Pulse:   86   Resp:   16   Temp: 98.6 F (37 C) 98.5 F (36.9 C) 98.4 F (36.9 C)   TempSrc: Oral Oral Oral   SpO2:   98% 97%  Weight:   52.7 kg     General: Pt is alert, awake, not in acute distress Cardiovascular: RRR, S1/S2 +, no  rubs, no gallops Respiratory: CTA bilaterally, no wheezing, no rhonchi Abdominal:  Soft, NT, ND, bowel sounds + Extremities: no edema, no cyanosis    The results of significant diagnostics from this hospitalization (including imaging, microbiology, ancillary and laboratory) are listed below for reference.     Microbiology: Recent Results (from the past 240 hour(s))  Blood Culture (routine x 2)     Status: None (Preliminary result)   Collection Time: 02/10/18 10:25 AM  Result Value Ref Range Status   Specimen Description   Final    BLOOD RIGHT ANTECUBITAL Performed at [facility] [address] [place], [place] [address]    Special Requests   Final    BOTTLES DRAWN AEROBIC AND ANAEROBIC Blood Culture results may not be optimal due to an inadequate volume of blood received in culture bottles Performed at [facility] [address] [place], [place] [address]    Culture   Final    NO GROWTH < 24 HOURS Performed at [facility] [address], [place], [place] [address]    Report Status PENDING  Incomplete  Blood Culture (routine x 2)     Status: None (Preliminary result)   Collection Time: 02/10/18 10:25 AM  Result Value Ref Range Status   Specimen Description   Final    BLOOD LEFT ANTECUBITAL Performed at [facility] [address], [place], [place] [address]    Special Requests   Final    BOTTLES DRAWN AEROBIC AND ANAEROBIC Blood Culture results may not be optimal due to an inadequate volume of blood received in culture bottles Performed at [facility] [address] [place], [place] [address]    Culture   Final    NO GROWTH < 24 HOURS Performed at [facility] [address] [place], [place] [address]    Report Status PENDING  Incomplete     Labs: BNP (last 3 results) Recent Labs    12/13/17 1051 12/21/17 0237  BNP 60.6 003.7*   Basic Metabolic Panel: Recent Labs   Lab 02/07/18 1156 02/10/18 0909 02/10/18 1653 02/11/18 0635  NA 142 137  --  140  K 3.7 3.4*  --  4.1  CL 105 105  --  111  CO2 24 20*  --  17*  GLUCOSE 93 128*  --  82  BUN 16 10  --  9  CREATININE 1.11* 0.88  --  0.73  CALCIUM 9.3 7.9*  --  7.7*  MG  --   --  1.4*  --   PHOS  --   --  2.1*  --    Liver Function Tests: Recent Labs  Lab 02/07/18 1156 02/10/18 0909 02/11/18 0635  AST 18 22 23   ALT 34* 22 19  ALKPHOS  --  60 56  BILITOT 0.3 1.1 0.5  PROT 6.0* 6.0* 5.7*  ALBUMIN  --  2.4* 2.2*   No results for input(s): LIPASE, AMYLASE in the last 168 hours. No results for input(s): AMMONIA in the last 168 hours. CBC: Recent Labs  Lab 02/10/18 0909 02/11/18 0635  WBC 12.9* 11.9*  NEUTROABS 9.5*  --   HGB 8.8* 8.9*  HCT 28.4* 29.2*  MCV 99.6 101.0*  PLT 359 373   Cardiac Enzymes: No results for input(s): CKTOTAL, CKMB, CKMBINDEX, TROPONINI in the last 168 hours. BNP: Invalid input(s): POCBNP CBG: Recent Labs  Lab 02/11/18 0749  GLUCAP 82   D-Dimer No results for input(s): DDIMER in the last 72 hours. Hgb A1c No results  for input(s): HGBA1C in the last 72 hours. Lipid Profile No results for input(s): CHOL, HDL, LDLCALC, TRIG, CHOLHDL, LDLDIRECT in the last 72 hours. Thyroid function studies Recent Labs    02/10/18 1756  TSH 2.688   Anemia work up No results for input(s): VITAMINB12, FOLATE, FERRITIN, TIBC, IRON, RETICCTPCT in the last 72 hours. Urinalysis    Component Value Date/Time   COLORURINE AMBER (A) 02/11/2018 0426   APPEARANCEUR CLEAR 02/11/2018 0426   LABSPEC 1.014 02/11/2018 0426   PHURINE 6.0 02/11/2018 0426   GLUCOSEU NEGATIVE 02/11/2018 0426   HGBUR MODERATE (A) 02/11/2018 0426   BILIRUBINUR NEGATIVE 02/11/2018 0426   KETONESUR 5 (A) 02/11/2018 0426   PROTEINUR NEGATIVE 02/11/2018 0426   UROBILINOGEN 0.2 05/17/2014 1113   NITRITE NEGATIVE 02/11/2018 0426   LEUKOCYTESUR MODERATE (A) 02/11/2018 0426   Sepsis Labs Invalid  input(s): PROCALCITONIN,  WBC,  LACTICIDVEN Microbiology Recent Results (from the past 240 hour(s))  Blood Culture (routine x 2)     Status: None (Preliminary result)   Collection Time: 02/10/18 10:25 AM  Result Value Ref Range Status   Specimen Description   Final    BLOOD RIGHT ANTECUBITAL Performed at [facility], [place] [address] [place], [place] [address]    Special Requests   Final    BOTTLES DRAWN AEROBIC AND ANAEROBIC Blood Culture results may not be optimal due to an inadequate volume of blood received in culture bottles Performed at [facility] [address] [place], [place] [address]    Culture   Final    NO GROWTH < 24 HOURS Performed at [facility] [address], [place], [place] [address]    Report Status PENDING  Incomplete  Blood Culture (routine x 2)     Status: None (Preliminary result)   Collection Time: 02/10/18 10:25 AM  Result Value Ref Range Status   Specimen Description   Final    BLOOD LEFT ANTECUBITAL Performed at [facility] [address] [place], [place] [address]    Special Requests   Final    BOTTLES DRAWN AEROBIC AND ANAEROBIC Blood Culture results may not be optimal due to an inadequate volume of blood received in culture bottles Performed at [facility] [address] [place], [place] [address]    Culture   Final    NO GROWTH < 24 HOURS Performed at [facility] [address] [place],  [address]    Report Status PENDING  Incomplete     Time coordinating discharge: 33 minutes  SIGNED:   Georgette Shell, MD  [facility] 02/11/2018, 10:35 AM Pager   If 7PM-7AM, please contact night-coverage [email or web address] Password [email or web address]

## 2018-02-11 NOTE — Consult Note (Signed)
[facility] Nurse wound consult note Reason for Consult: Healing full thickness wound on right forearm, also asked to see areas of IAD to buttocks, perineum and medial thighs Wound type: trauma, moisture (IAD) Pressure Injury POA: NA Measurement: 11cm x 6cm x 0.1cm area of tissue trauma caused by traumatic injury during CT procedure. Healing. Wound bed: 90% intact, fragile, dry peeling tissue. Drainage (amount, consistency, odor) none Periwound:intact Dressing procedure/placement/frequency:I will provide guidance for Nursing using a topical antimicrobial and nonadherent dressing (xeroform) changing twice daily while here and once daily while at home. 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 This in conjunction with side to side positioning while in bed will provide relief.  [facility] nursing team will not follow, but will remain available to this patient, the nursing and medical teams.  Please re-consult if needed. Thanks, Maudie Flakes, MSN, RN, [place], Arther Abbott  Pager# [phone]

## 2018-02-11 NOTE — Progress Notes (Signed)
This shift pt returned from CT, temp at 100.7 oral. Removed 6 blankets, applied ice packs and cool compress, PRN med given. Will continue to monitor.

## 2018-02-11 NOTE — H&P (Deleted)
Physician Discharge Summary  IMAGINE NEST OEU:[id number] DOB: August 15, 1950 DOA: 02/10/2018  PCP: [name], MD  Admit date: 02/10/2018 Discharge date: 02/11/2018  Admitted From: Disposition:  Recommendations for Outpatient Follow-up:  1. Follow up with PCP in 1-2 weeks 2. Please obtain BMP/CBC in one week  Home Health:yes Equipment/Devices:none Discharge Condition:stable CODE STATUS:full Diet recommendation:cardiac Brief/Interim Summary:67 y.o. female with medical history significant of above medical comorbidities including allergic rhinitis, anxiety and depression, history of a breast cancer in remission, history of cataract surgery, chronic pain, diverticulosis, fibromyalgia, GERD, Graves' disease, hypercholesterolemia, irritable bowel syndrome, hypothyroidism moderate persistent asthma and osteoporosis along with history of C. difficile colitis who presented today with a history of fatigue, and a fever of 103 with associated nausea and vomiting.  States that she had a fever of 103 2 days ago and started having nausea or vomiting.  Constantly coughing up and more so lately. States she stays cold and had a history of a blood clot in her right leg into the lungs.  She has been taking antibiotics in the outpatient setting for her right forearm wound.  No nausea last couple days but started today.  States she is limping a lot of coughing but denies any chest pain.  Does admit to having some shortness of breath and did admit to having some loose watery diarrhea have been 3-4 times a day.  Has had C. difficile in the past and had formed movements the last 2 to 3 weeks but had loose bowel movements today and does not know if it is her irritable bowel syndrome.  Because of her fever her son brought her in to the emergency room for further evaluation recommendations and patient was called to admit this patient for fever, diarrhea and emesis associate with abdominal pain.  Gust the case with Dr. Linus Salmons  of infectious diseases and we have low suspicion for C. difficile as of now and recommended not starting empiric treatment but obtaining further work-up  ED Course: In the ED patient basic blood work done as well as blood cultures and urinalysis.  She is given a liter of normal saline.  Also given IV vancomycin for her Right forearm.  Discharge Diagnoses:  Active Problems:   Hyperlipidemia   Seasonal and perennial allergic rhinitis   GERD   Irritable bowel syndrome   BREAST CANCER, HX OF   Cough   Acute deep vein thrombosis (DVT) of right lower extremity (HCC)   History of breast cancer   Chronic pain syndrome   Fibromyalgia   Leukocytosis   DVT (deep venous thrombosis) (HCC)   Depression   Fever   SIRS (systemic inflammatory response syndrome) (HCC)   Nausea and vomiting   Diarrhea   Abdominal pain    #1 fever patient admitted with fever at home but no fever since admission.  Work-up shows no source of infection.  CT of the chest abdomen pelvis there is no evidence of acute infection.  She was treated with Ancef overnight for possible right upper extremity cellulitis mild.  She is afebrile with normal blood pressure and pulse and has no complaints at this time.  She already has home health in place which will be continued upon discharge.  Nausea vomiting diarrhea has been resolved.  Patient was seen by wound care team recommended  #2 DVT continue Xarelto.  #3 depression anxiety and fibromyalgia continue home medications.  Estimated body mass index is 21.25 kg/m as calculated from the following:   Height as  of 02/07/18: 5\' 2"  (1.575 m).   Weight as of this encounter: 52.7 kg.  Discharge Instructions  Discharge Instructions    Call MD for:  difficulty breathing, headache or visual disturbances   Complete by:  As directed    Call MD for:  persistant dizziness or light-headedness   Complete by:  As directed    Call MD for:  persistant dizziness or light-headedness   Complete  by:  As directed    Call MD for:  persistant nausea and vomiting   Complete by:  As directed    Call MD for:  persistant nausea and vomiting   Complete by:  As directed    Call MD for:  redness, tenderness, or signs of infection (pain, swelling, redness, odor or green/yellow discharge around incision site)   Complete by:  As directed    Call MD for:  temperature >100.4   Complete by:  As directed    Call MD for:  temperature >100.4   Complete by:  As directed    Diet - low sodium heart healthy   Complete by:  As directed    Diet - low sodium heart healthy   Complete by:  As directed    Increase activity slowly   Complete by:  As directed    Increase activity slowly   Complete by:  As directed      Allergies as of 02/11/2018      Reactions   Dust Mite Extract Shortness Of Breath, Other (See Comments)   "sneezing" (02/20/2012)   Molds & Smuts Shortness Of Breath   Morphine Sulfate Itching   Other Shortness Of Breath, Other (See Comments)   Grass and weeds "sneezing; filled sinuses" (02/20/2012)   Penicillins Rash, Other (See Comments)   "welts" (02/20/2012) Has patient had a PCN reaction causing immediate rash, facial/tongue/throat swelling, SOB or lightheadedness with hypotension: Unknown Has patient had a PCN reaction causing severe rash involving mucus membranes or skin necrosis: No Has patient had a PCN reaction that required hospitalization: No Has patient had a PCN reaction occurring within the last 10 years: No If all of the above answers are "NO", then may proceed with Cephalosporin use.   Reclast [zoledronic Acid] Other (See Comments)   Fever, Put in hospital, dr said it was a reaction from a reaction    Rofecoxib Swelling   Vioxx REACTION: feet swelling   Shrimp Flavor Anaphylaxis   ALL SHELLFISH   Tetracycline Hcl Nausea And Vomiting   Xolair [omalizumab] Other (See Comments)   Caused Blood clot   Dilaudid [hydromorphone Hcl] Itching   Hydrocodone-acetaminophen  Nausea And Vomiting   Levofloxacin Other (See Comments)   REACTION: GI upset   Oxycodone Hcl Nausea And Vomiting   Paroxetine Nausea And Vomiting   Paxil   Celecoxib Swelling   Feet swelling   Diltiazem Swelling   Lactose Intolerance (gi) Other (See Comments)   Bloating and gas   Tree Extract Other (See Comments)   "tested and told I was allergic to it; never experienced a reaction to it" (02/20/2012)      Medication List    STOP taking these medications   doxycycline 100 MG tablet Commonly known as:  VIBRA-TABS     TAKE these medications   AEROCHAMBER MV inhaler Use as instructed   albuterol 108 (90 Base) MCG/ACT inhaler Commonly known as:  PROAIR HFA Inhale 2 puffs into the lungs every 4 (four) hours as needed for wheezing or shortness of breath.  ALIGN 4 MG Caps Take 4 mg by mouth every evening.   Alpha-Lipoic Acid 600 MG Caps Take 600 mg by mouth daily.   alum & mag hydroxide-simeth 200-200-20 MG/5ML suspension Commonly known as:  MAALOX/MYLANTA Take 15 mLs by mouth every 4 (four) hours as needed for indigestion or heartburn.   azelastine 0.1 % nasal spray Commonly known as:  ASTELIN USE 2 SPRAYS IN EACH NOSTRIL DAILY AS DIRECTED   BEANO PO Take 1 capsule by mouth 3 (three) times daily as needed (gas/ bloating).   CALCIUM-MAGNESIUM PO Take 1 tablet by mouth at bedtime.   cephALEXin 500 MG capsule Commonly known as:  KEFLEX Take 1 capsule (500 mg total) by mouth 4 (four) times daily for 4 days.   cetirizine 10 MG tablet Commonly known as:  ZYRTEC Take 5 mg by mouth every evening.   cholecalciferol 25 MCG (1000 UT) tablet Commonly known as:  VITAMIN D Take 1,000 Units by mouth daily.   cyproheptadine 4 MG tablet Commonly known as:  PERIACTIN TAKE 2 TABLETS BY MOUTH EVERY EVENING What changed:  additional instructions   denosumab 60 MG/ML Soln injection Commonly known as:  PROLIA Inject 60 mg into the skin every 6 (six) months. Administer in upper  arm, thigh, or abdomen   dexlansoprazole 60 MG capsule Commonly known as:  DEXILANT Take 1 capsule (60 mg total) by mouth daily.   diclofenac sodium 1 % Gel Commonly known as:  VOLTAREN Apply 2 g topically 2 (two) times daily.   EPINEPHrine 0.3 mg/0.3 mL Soaj injection Commonly known as:  EPIPEN 2-PAK Inject 0.3 mLs (0.3 mg total) into the muscle Once PRN for up to 1 dose.   FLUTTER Devi Use as directed   gabapentin 300 MG capsule Commonly known as:  NEURONTIN Take 600 mg by mouth daily.   Gerhardt's butt cream Crea Apply 1 application topically 4 (four) times daily.   LACTAID FAST ACT 9000 units Tabs Generic drug:  Lactase Take 18,000 Units by mouth 4 (four) times daily as needed (dairy consumption).   lidocaine 5 % Commonly known as:  LIDODERM Place 1 patch onto the skin daily as needed (pain). Remove & Discard patch within 12 hours or as directed by MD   mirtazapine 30 MG disintegrating tablet Commonly known as:  REMERON SOL-TAB DISSOLVE 1 TABLET IN MOUTH EVERY NIGHT AT BEDTIME   mometasone 50 MCG/ACT nasal spray Commonly known as:  NASONEX Place 2 sprays into the nose daily.   mupirocin ointment 2 % Commonly known as:  BACTROBAN Place 1 application into the nose at bedtime. To prevent nose bleeds   nystatin 100000 UNIT/ML suspension Commonly known as:  MYCOSTATIN Use as directed 5 mLs (500,000 Units total) in the mouth or throat at bedtime. Swish and spit   PRESCRIPTION MEDICATION Place 2 puffs into both ears once a week. Place capsule in dispenser and instill 2 puffs in each ear once a week after washing hair. CS (X) AHCL    CHLORAMP/SULFU/AMPHO B/ H DROC 100-100-5 mg capsule - compounded at [place]   ranitidine 300 MG tablet Commonly known as:  ZANTAC Take 1 tablet (300 mg total) by mouth every evening.   rivaroxaban 20 MG Tabs tablet Commonly known as:  XARELTO Take 1 tablet (20 mg total) by mouth daily with supper.   saccharomyces boulardii 250  MG capsule Commonly known as:  FLORASTOR Take 1 capsule (250 mg total) by mouth 2 (two) times daily.   silver sulfADIAZINE 1 %  cream Commonly known as:  SILVADENE Apply 1 application topically daily.   SOOTHE OP Place 1 drop into both eyes 2 (two) times daily as needed (dry eyes).   Tiotropium Bromide Monohydrate 2.5 MCG/ACT Aers Commonly known as:  SPIRIVA RESPIMAT Inhale 2 puffs into the lungs daily.      Follow-up Information    [name], MD Follow up.   Specialty:  Family Medicine Contact information: [address] [place] [address] [phone]        Buford Dresser, MD .   Specialty:  Cardiology Contact information: [address] [place] [address] [phone]          Allergies  Allergen Reactions  . Dust Mite Extract Shortness Of Breath and Other (See Comments)    "sneezing" (02/20/2012)  . Molds & Smuts Shortness Of Breath  . Morphine Sulfate Itching  . Other Shortness Of Breath and Other (See Comments)    Grass and weeds "sneezing; filled sinuses" (02/20/2012)  . Penicillins Rash and Other (See Comments)    "welts" (02/20/2012) Has patient had a PCN reaction causing immediate rash, facial/tongue/throat swelling, SOB or lightheadedness with hypotension: Unknown Has patient had a PCN reaction causing severe rash involving mucus membranes or skin necrosis: No Has patient had a PCN reaction that required hospitalization: No Has patient had a PCN reaction occurring within the last 10 years: No If all of the above answers are "NO", then may proceed with Cephalosporin use.   Marland Kitchen Reclast [Zoledronic Acid] Other (See Comments)    Fever, Put in hospital, dr said it was a reaction from a reaction   . Rofecoxib Swelling    Vioxx REACTION: feet swelling  . Shrimp Flavor Anaphylaxis    ALL SHELLFISH  . Tetracycline Hcl Nausea And Vomiting  . Xolair [Omalizumab] Other (See Comments)    Caused Blood clot  . Dilaudid  [Hydromorphone Hcl] Itching  . Hydrocodone-Acetaminophen Nausea And Vomiting  . Levofloxacin Other (See Comments)    REACTION: GI upset  . Oxycodone Hcl Nausea And Vomiting  . Paroxetine Nausea And Vomiting    Paxil   . Celecoxib Swelling    Feet swelling  . Diltiazem Swelling  . Lactose Intolerance (Gi) Other (See Comments)    Bloating and gas  . Tree Extract Other (See Comments)    "tested and told I was allergic to it; never experienced a reaction to it" (02/20/2012)    Consultations:  Wound care   Procedures/Studies: Ct Abdomen Pelvis Wo Contrast  Result Date: 02/10/2018 CLINICAL DATA:  Fever, nausea/vomiting. Cough. History of right leg DVT and PE. History of breast cancer in remission. History of C diff. EXAM: CT CHEST, ABDOMEN AND PELVIS WITHOUT CONTRAST TECHNIQUE: Multidetector CT imaging of the chest, abdomen and pelvis was performed following the standard protocol without IV contrast. COMPARISON:  CT abdomen/pelvis dated 01/15/2018. CTA chest dated 12/13/2017. FINDINGS: CT CHEST FINDINGS Cardiovascular: The heart is normal in size. No pericardial effusion. No evidence thoracic aortic aneurysm. Atherosclerotic calcifications of the aortic arch. Coronary atherosclerosis the LAD and left coronary artery. Mediastinum/Nodes: No suspicious mediastinal lymphadenopathy. Lungs/Pleura: Minimal dependent atelectasis in the posterior right upper and bilateral lower lobes. Mild scarring/atelectasis in the inferior right middle lobe and lingula. No focal consolidation. No suspicious pulmonary nodules. 4 mm triangular subpleural nodule in the lateral left lung base (series 4/image 102), unchanged from 2018, benign. No pleural effusion or pneumothorax. Musculoskeletal: Visualized osseous structures are within normal limits. CT ABDOMEN PELVIS  FINDINGS Hepatobiliary: Unenhanced liver is unremarkable. Status post cholecystectomy. No intrahepatic or extrahepatic ductal dilatation. Pancreas: Within  normal limits. Spleen: Within normal limits. Adrenals/Urinary Tract: Adrenal glands are within normal limits. Kidneys are grossly unremarkable. No renal, ureteral, or bladder calculi. No hydronephrosis. Bladder is within normal limits. Stomach/Bowel: Stomach is within normal limits. No evidence of bowel obstruction. Normal appendix (series 2/image 91). No colonic wall thickening or inflammatory changes. Vascular/Lymphatic: No evidence of abdominal aortic aneurysm. Atherosclerotic calcifications of the abdominal aorta and branch vessels. No suspicious abdominopelvic lymphadenopathy. Reproductive: Status post hysterectomy. Ovaries within normal limits.  No right adnexal mass. Other: No abdominopelvic ascites. Musculoskeletal: Avascular necrosis of the bilateral femoral heads, better evaluated on recent prior MRI. Mild degenerative changes at L4-5. IMPRESSION: No colonic wall thickening or inflammatory changes to suggest infectious/inflammatory colitis. No CT findings to account for the patient's fever. Additional ancillary findings as above. Electronically Signed   By: Julian Hy M.D.   On: 02/10/2018 19:40   Dg Chest 2 View  Result Date: 02/10/2018 CLINICAL DATA:  Fever.  Nausea.  Emesis. EXAM: CHEST - 2 VIEW COMPARISON:  01/17/2018 FINDINGS: The heart size and mediastinal contours are within normal limits. Both lungs are clear. The visualized skeletal structures are unremarkable. IMPRESSION: No active cardiopulmonary disease. Electronically Signed   By: Nelson Chimes M.D.   On: 02/10/2018 10:05   Dg Chest 2 View  Result Date: 01/14/2018 CLINICAL DATA:  Weakness over the last 4 days, some shortness of breath, history of breast carcinoma diagnosed in 1998 EXAM: CHEST - 2 VIEW COMPARISON:  Chest x-ray 12/17/2017 FINDINGS: No active infiltrate is seen and there is no evidence of pleural effusion. Mediastinal and hilar contours are unremarkable. Mild cardiomegaly is stable. No acute bony abnormality is  seen. IMPRESSION: Stable mild cardiomegaly.  No active lung disease. Electronically Signed   By: Ivar Drape M.D.   On: 01/14/2018 17:09   Dg Forearm Right  Result Date: 02/10/2018 CLINICAL DATA:  Fall, skin abrasion/laceration, now with redness/swelling, possible cellulitis EXAM: RIGHT FOREARM - 2 VIEW COMPARISON:  None. FINDINGS: No fracture or dislocation is seen. The joint spaces are preserved. Visualized soft tissues are within normal limits. Postsurgical changes at the elbow. No radiopaque foreign body is seen. IMPRESSION: No fracture, dislocation, or radiopaque foreign body is seen. Electronically Signed   By: Julian Hy M.D.   On: 02/10/2018 17:55   Dg Arthro Hip Left  Result Date: 01/18/2018 INDICATION: Left hip joint effusion.  Fever.  Chills. EXAM: ARTHROCENTESIS OF LEFT HIP JOINT UNDER FLUOROSCOPIC GUIDANCE COMPARISON:  None. FLUOROSCOPY TIME:  Fluoroscopy Time:  12 seconds Radiation Exposure Index (if provided by the fluoroscopic device): 1 mGy Number of Acquired Spot Images: 0 COMPLICATIONS: None immediate. PROCEDURE: Informed written consent was obtained from the patient after discussion of the risks, benefits and alternatives to treatment. The patient was placed prone on the fluoroscopy table and the left extremity was placed in a slight degree of flexion and internal rotation. The left hip was localized with fluoroscopy. The skin overlying the anterior aspect of the hip was prepped and draped in usual sterile fashion. A 22 gauge spinal needle was advanced into the hip joint at the lateral aspect of the femoral head-neck junction, after the overlying soft tissues were anesthetized with 1% lidocaine. A fluoroscopic image was saved and sent to PACs. Two drops of clear yellow fluid was aspirated from the left hip joint. The aspirated fluid was sent for laboratory evaluation ordered by referring  physician. The needle was removed and a dressing was placed. FINDINGS: The image documents  needle placement in the left hip joint. IMPRESSION: Successful fluoroscopic guided aspiration of the left hip. Electronically Signed   By: Marybelle Killings M.D.   On: 01/18/2018 10:31   Dg Ankle 2 Views Right  Result Date: 01/14/2018 CLINICAL DATA:  Right foot and ankle pain. EXAM: RIGHT ANKLE - 2 VIEW COMPARISON:  Ankle radiograph 06/10/2012 FINDINGS: There is no evidence of fracture or dislocation. Ankle mortise is preserved. Mild talonavicular degenerative change. Plantar calcaneal spur and Achilles tendon enthesophyte. No bony destructive change or periosteal reaction. Diffuse soft tissue edema about the ankle. Increased density anteriorly may be generalized soft tissue edema or small effusion. IMPRESSION: 1. Diffuse soft tissue edema about the ankle. Focal soft tissue density anteriorly may be joint effusion or more generalized subcutaneous edema. 2. No acute osseous abnormality. Electronically Signed   By: Keith Rake M.D.   On: 01/14/2018 22:39   Dg Ankle Complete Left  Result Date: 01/14/2018 CLINICAL DATA:  Weakness over 4 days, left foot and ankle pain EXAM: LEFT ANKLE COMPLETE - 3+ VIEW COMPARISON:  Left ankle films of 12/16/2016 FINDINGS: The left ankle joint appears normal. Alignment is normal. No acute abnormality is seen. Again the nonunited fracture through the base of the left fifth metatarsal is present. IMPRESSION: Negative left ankle. Ninety night and fracture through the base of the left fifth metatarsal. Electronically Signed   By: Ivar Drape M.D.   On: 01/14/2018 17:12   Ct Chest Wo Contrast  Result Date: 02/10/2018 CLINICAL DATA:  Fever, nausea/vomiting. Cough. History of right leg DVT and PE. History of breast cancer in remission. History of C diff. EXAM: CT CHEST, ABDOMEN AND PELVIS WITHOUT CONTRAST TECHNIQUE: Multidetector CT imaging of the chest, abdomen and pelvis was performed following the standard protocol without IV contrast. COMPARISON:  CT abdomen/pelvis dated  01/15/2018. CTA chest dated 12/13/2017. FINDINGS: CT CHEST FINDINGS Cardiovascular: The heart is normal in size. No pericardial effusion. No evidence thoracic aortic aneurysm. Atherosclerotic calcifications of the aortic arch. Coronary atherosclerosis the LAD and left coronary artery. Mediastinum/Nodes: No suspicious mediastinal lymphadenopathy. Lungs/Pleura: Minimal dependent atelectasis in the posterior right upper and bilateral lower lobes. Mild scarring/atelectasis in the inferior right middle lobe and lingula. No focal consolidation. No suspicious pulmonary nodules. 4 mm triangular subpleural nodule in the lateral left lung base (series 4/image 102), unchanged from 2018, benign. No pleural effusion or pneumothorax. Musculoskeletal: Visualized osseous structures are within normal limits. CT ABDOMEN PELVIS FINDINGS Hepatobiliary: Unenhanced liver is unremarkable. Status post cholecystectomy. No intrahepatic or extrahepatic ductal dilatation. Pancreas: Within normal limits. Spleen: Within normal limits. Adrenals/Urinary Tract: Adrenal glands are within normal limits. Kidneys are grossly unremarkable. No renal, ureteral, or bladder calculi. No hydronephrosis. Bladder is within normal limits. Stomach/Bowel: Stomach is within normal limits. No evidence of bowel obstruction. Normal appendix (series 2/image 91). No colonic wall thickening or inflammatory changes. Vascular/Lymphatic: No evidence of abdominal aortic aneurysm. Atherosclerotic calcifications of the abdominal aorta and branch vessels. No suspicious abdominopelvic lymphadenopathy. Reproductive: Status post hysterectomy. Ovaries within normal limits.  No right adnexal mass. Other: No abdominopelvic ascites. Musculoskeletal: Avascular necrosis of the bilateral femoral heads, better evaluated on recent prior MRI. Mild degenerative changes at L4-5. IMPRESSION: No colonic wall thickening or inflammatory changes to suggest infectious/inflammatory colitis. No CT  findings to account for the patient's fever. Additional ancillary findings as above. Electronically Signed   By: Henderson Newcomer.D.  On: 02/10/2018 19:40   Ct Abdomen Pelvis W Contrast  Result Date: 01/15/2018 CLINICAL DATA:  nausea vomiting and profuse diarrhea. Stool studies were done. C. difficile antigen was positive. Toxin negative. PCR positive. GI pathogen panel pending. Patient with abdominal tenderness on examination. Her WBC is normal. She was febrile last night. EXAM: CT ABDOMEN AND PELVIS WITH CONTRAST TECHNIQUE: Multidetector CT imaging of the abdomen and pelvis was performed using the standard protocol following bolus administration of intravenous contrast. CONTRAST:  161mL OMNIPAQUE IOHEXOL 300 MG/ML SOLN, 5mL OMNIPAQUE IOHEXOL 300 MG/ML SOLN COMPARISON:  03/31/2007 FINDINGS: Lower chest: Scattered coronary calcifications. No pleural or pericardial effusion. Subpleural linear scarring or atelectasis posteriorly in both lower lobes. Hepatobiliary: No focal liver abnormality is seen. Status post cholecystectomy. No biliary dilatation. Pancreas: Unremarkable. No pancreatic ductal dilatation or surrounding inflammatory changes. Spleen: Normal in size without focal abnormality. Adrenals/Urinary Tract: Normal adrenals. No renal mass or hydronephrosis. No ureterectasis. Urinary bladder physiologically distended. Stomach/Bowel: Stomach and small bowel decompressed. Appendix surgically absent. The colon is nondilated, unremarkable. Vascular/Lymphatic: Scattered aortoiliac calcified plaque without aneurysm or stenosis. Portal vein patent. No abdominal or pelvic adenopathy. Reproductive: Status post hysterectomy. No adnexal masses. Other: No ascites.  No free air. Musculoskeletal: Mild spondylitic changes in the lower lumbar spine. No fracture or other acute bone abnormality. IMPRESSION: 1. No acute abdominal findings. 2. Coronary and aortoiliac  atherosclerosis (ICD10-170.0). Electronically Signed    By: Lucrezia Europe M.D.   On: 01/15/2018 17:17   Mr Hip Left Wo Contrast  Result Date: 01/17/2018 CLINICAL DATA:  Left hip pain.  No trauma. EXAM: MR OF THE LEFT HIP WITHOUT CONTRAST TECHNIQUE: Multiplanar, multisequence MR imaging was performed. No intravenous contrast was administered. COMPARISON:  Left hip x-rays dated January 15, 2018. FINDINGS: Bones: Greater than right serpiginous signal changes within the left femoral heads, consistent with avascular necrosis. No significant surrounding marrow edema. No fracture or dislocation. No focal bone lesion. The visualized sacroiliac joints and symphysis pubis appear normal. Articular cartilage and labrum Articular cartilage: No focal chondral defect or subchondral signal abnormality identified. Labrum: There is no gross labral tear or paralabral abnormality. Joint or bursal effusion Joint effusion: Small left hip joint effusion. No right hip joint effusion. Bursae: No focal periarticular fluid collection. Muscles and tendons Muscles and tendons: Partial tear of the left gluteus minimus tendon. Mild bilateral gluteus medius tendinosis. Moderate bilateral hamstring tendinosis. The iliopsoas tendons are unremarkable. Prominent edema within the left gluteus minimus muscle. Mild edema within the bilateral adductor musculature and inferior left gluteus medius muscle. No muscle atrophy. Other findings Miscellaneous: The visualized internal pelvic contents appear unremarkable. IMPRESSION: 1. 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 2. Small left hip joint effusion, nonspecific. 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 If there is clinical concern for septic arthritis, arthrocentesis can be performed. 3. Partial tear of the left gluteus minimus tendon with significant muscle strain. 4. Mild bilateral gluteus medius and  moderate bilateral hamstring tendinosis. Electronically Signed   By: Titus Dubin M.D.   On: 01/17/2018 13:19   Dg Chest Port 1 View  Result Date: 01/17/2018 CLINICAL DATA:  Cough. EXAM: PORTABLE CHEST 1 VIEW COMPARISON:  Chest x-ray dated January 14, 2018. FINDINGS: The heart size and mediastinal contours are within normal limits. Normal pulmonary vascularity. No focal consolidation, pleural effusion, or pneumothorax. No acute osseous abnormality. Unchanged surgical clips  in the left breast. IMPRESSION: No active disease. Electronically Signed   By: Titus Dubin M.D.   On: 01/17/2018 09:55   Dg Foot 2 Views Right  Result Date: 01/14/2018 CLINICAL DATA:  Right foot and ankle pain. EXAM: RIGHT FOOT - 2 VIEW COMPARISON:  Foot radiograph 02/20/2012 FINDINGS: Suspected incomplete transverse fracture of the proximal fifth metatarsal about the lateral cortex. No other fracture. Small osseous density about delay lateral cuboid is likely an accessory ossicle. Minimal hallux valgus, alignment is otherwise maintained. No periosteal reaction or bony destructive change. Plantar calcaneal spur and Achilles tendon enthesophyte. Diffuse soft tissue edema. IMPRESSION: Suspected incomplete transverse fracture of the proximal fifth metatarsal shaft. Electronically Signed   By: Keith Rake M.D.   On: 01/14/2018 22:42   Dg Foot Complete Left  Result Date: 01/14/2018 CLINICAL DATA:  Weakness over the last 4 days, left foot and ankle pain, history of breast carcinoma diagnosed in 1998 EXAM: LEFT FOOT - COMPLETE 3+ VIEW COMPARISON:  Left foot films of 11/06/2016 FINDINGS: There is incomplete healing of the previously noted transverse fracture through the base of the left fifth metatarsal. The fracture line is still somewhat visible. No acute fracture is seen. Alignment is normal. No erosion is seen. IMPRESSION: Incomplete healing of the previously diagnosed transverse fracture through the base of the left fifth  metatarsal. Electronically Signed   By: Ivar Drape M.D.   On: 01/14/2018 17:10   Dg Hip Port Unilat With Pelvis 1v Left  Result Date: 01/15/2018 CLINICAL DATA:  Left hip pain EXAM: DG HIP (WITH OR WITHOUT PELVIS) 1V PORT LEFT COMPARISON:  None. FINDINGS: Early spurring in the hip joints bilaterally. SI joints are symmetric and unremarkable. No acute bony abnormality. Specifically, no fracture, subluxation, or dislocation. IMPRESSION: Early spurring in the hip joints bilaterally. No acute bony abnormality. Electronically Signed   By: Rolm Baptise M.D.   On: 01/15/2018 11:07    (Echo, Carotid, EGD, Colonoscopy, ERCP)    Subjective:   Discharge Exam: Vitals:   02/11/18 0425 02/11/18 0931  BP: (!) 148/77   Pulse: 86   Resp: 16   Temp: 98.4 F (36.9 C)   SpO2: 98% 97%   Vitals:   02/10/18 2204 02/10/18 2310 02/11/18 0425 02/11/18 0931  BP:   (!) 148/77   Pulse:   86   Resp:   16   Temp: 98.6 F (37 C) 98.5 F (36.9 C) 98.4 F (36.9 C)   TempSrc: Oral Oral Oral   SpO2:   98% 97%  Weight:   52.7 kg     General: Pt is alert, awake, not in acute distress Cardiovascular: RRR, S1/S2 +, no rubs, no gallops Respiratory: CTA bilaterally, no wheezing, no rhonchi Abdominal: Soft, NT, ND, bowel sounds + Extremities: no edema, no cyanosis    The results of significant diagnostics from this hospitalization (including imaging, microbiology, ancillary and laboratory) are listed below for reference.     Microbiology: Recent Results (from the past 240 hour(s))  Blood Culture (routine x 2)     Status: None (Preliminary result)   Collection Time: 02/10/18 10:25 AM  Result Value Ref Range Status   Specimen Description   Final    BLOOD RIGHT ANTECUBITAL Performed at [facility] [address], [place], [place] [address]    Special Requests   Final    BOTTLES DRAWN AEROBIC AND ANAEROBIC Blood Culture results may not be optimal due to an inadequate volume of blood  received  in culture bottles Performed at [facility], [place] [address] [place], [place] [address]    Culture   Final    NO GROWTH < 24 HOURS Performed at [facility] [address], [place], [place] [address]    Report Status PENDING  Incomplete  Blood Culture (routine x 2)     Status: None (Preliminary result)   Collection Time: 02/10/18 10:25 AM  Result Value Ref Range Status   Specimen Description   Final    BLOOD LEFT ANTECUBITAL Performed at [facility] [address] [place], [place] [address]    Special Requests   Final    BOTTLES DRAWN AEROBIC AND ANAEROBIC Blood Culture results may not be optimal due to an inadequate volume of blood received in culture bottles Performed at [facility] [address], [place], [place] [address]    Culture   Final    NO GROWTH < 24 HOURS Performed at [facility] [address] [place],  [address]    Report Status PENDING  Incomplete     Labs: BNP (last 3 results) Recent Labs    12/13/17 1051 12/21/17 0237  BNP 60.6 756.4*   Basic Metabolic Panel: Recent Labs  Lab 02/07/18 1156 02/10/18 0909 02/10/18 1653 02/11/18 0635  NA 142 137  --  140  K 3.7 3.4*  --  4.1  CL 105 105  --  111  CO2 24 20*  --  17*  GLUCOSE 93 128*  --  82  BUN 16 10  --  9  CREATININE 1.11* 0.88  --  0.73  CALCIUM 9.3 7.9*  --  7.7*  MG  --   --  1.4*  --   PHOS  --   --  2.1*  --    Liver Function Tests: Recent Labs  Lab 02/07/18 1156 02/10/18 0909 02/11/18 0635  AST 18 22 23   ALT 34* 22 19  ALKPHOS  --  60 56  BILITOT 0.3 1.1 0.5  PROT 6.0* 6.0* 5.7*  ALBUMIN  --  2.4* 2.2*   No results for input(s): LIPASE, AMYLASE in the last 168 hours. No results for input(s): AMMONIA in the last 168 hours. CBC: Recent Labs  Lab 02/10/18 0909 02/11/18 0635  WBC 12.9* 11.9*  NEUTROABS 9.5*  --   HGB 8.8* 8.9*  HCT 28.4* 29.2*  MCV 99.6 101.0*  PLT 359 373    Cardiac Enzymes: No results for input(s): CKTOTAL, CKMB, CKMBINDEX, TROPONINI in the last 168 hours. BNP: Invalid input(s): POCBNP CBG: Recent Labs  Lab 02/11/18 0749  GLUCAP 82   D-Dimer No results for input(s): DDIMER in the last 72 hours. Hgb A1c No results for input(s): HGBA1C in the last 72 hours. Lipid Profile No results for input(s): CHOL, HDL, LDLCALC, TRIG, CHOLHDL, LDLDIRECT in the last 72 hours. Thyroid function studies Recent Labs    02/10/18 1756  TSH 2.688   Anemia work up No results for input(s): VITAMINB12, FOLATE, FERRITIN, TIBC, IRON, RETICCTPCT in the last 72 hours. Urinalysis    Component Value Date/Time   COLORURINE AMBER (A) 02/11/2018 0426   APPEARANCEUR CLEAR 02/11/2018 0426   LABSPEC 1.014 02/11/2018 0426   PHURINE 6.0 02/11/2018 0426   GLUCOSEU NEGATIVE 02/11/2018 0426   HGBUR MODERATE (A) 02/11/2018 0426   BILIRUBINUR NEGATIVE 02/11/2018 0426   KETONESUR 5 (A) 02/11/2018 0426   PROTEINUR NEGATIVE 02/11/2018 0426   UROBILINOGEN 0.2 05/17/2014 1113  NITRITE NEGATIVE 02/11/2018 0426   LEUKOCYTESUR MODERATE (A) 02/11/2018 0426   Sepsis Labs Invalid input(s): PROCALCITONIN,  WBC,  LACTICIDVEN Microbiology Recent Results (from the past 240 hour(s))  Blood Culture (routine x 2)     Status: None (Preliminary result)   Collection Time: 02/10/18 10:25 AM  Result Value Ref Range Status   Specimen Description   Final    BLOOD RIGHT ANTECUBITAL Performed at [facility] [address], [place], [place] [address]    Special Requests   Final    BOTTLES DRAWN AEROBIC AND ANAEROBIC Blood Culture results may not be optimal due to an inadequate volume of blood received in culture bottles Performed at [facility] [address], [place], [place] [address]    Culture   Final    NO GROWTH < 24 HOURS Performed at [facility] [address], [place], [place] [address]    Report Status PENDING   Incomplete  Blood Culture (routine x 2)     Status: None (Preliminary result)   Collection Time: 02/10/18 10:25 AM  Result Value Ref Range Status   Specimen Description   Final    BLOOD LEFT ANTECUBITAL Performed at [facility] [address] [place], [place] [address]    Special Requests   Final    BOTTLES DRAWN AEROBIC AND ANAEROBIC Blood Culture results may not be optimal due to an inadequate volume of blood received in culture bottles Performed at [facility] [address], [place], [place] [address]    Culture   Final    NO GROWTH < 24 HOURS Performed at [facility] [address], [place], [place] [address]    Report Status PENDING  Incomplete     Time coordinating discharge: 34  minutes  SIGNED:   Georgette Shell, MD  [facility] 02/11/2018, 10:30 AM Pager   If 7PM-7AM, please contact night-coverage [email or web address] Password [email or web address]

## 2018-02-12 LAB — COMPREHENSIVE METABOLIC PANEL
ALT: 35 U/L (ref 0–44)
AST: 53 U/L — ABNORMAL HIGH (ref 15–41)
Albumin: 2.1 g/dL — ABNORMAL LOW (ref 3.5–5.0)
Alkaline Phosphatase: 65 U/L (ref 38–126)
Anion gap: 11 (ref 5–15)
BUN: 8 mg/dL (ref 8–23)
CO2: 20 mmol/L — ABNORMAL LOW (ref 22–32)
Calcium: 8 mg/dL — ABNORMAL LOW (ref 8.9–10.3)
Chloride: 106 mmol/L (ref 98–111)
Creatinine, Ser: 0.79 mg/dL (ref 0.44–1.00)
GFR calc Af Amer: >60 mL/min (ref >60)
GFR calc non Af Amer: >60 mL/min (ref >60)
Glucose, Bld: 104 mg/dL — ABNORMAL HIGH (ref 70–99)
Potassium: 4.2 mmol/L (ref 3.5–5.1)
Sodium: 137 mmol/L (ref 135–145)
Total Bilirubin: 0.2 mg/dL — ABNORMAL LOW (ref 0.3–1.2)
Total Protein: 5.8 g/dL — ABNORMAL LOW (ref 6.5–8.1)

## 2018-02-12 LAB — CBC
HCT: 27.7 % — ABNORMAL LOW (ref 36.0–46.0)
Hemoglobin: 8.4 g/dL — ABNORMAL LOW (ref 12.0–15.0)
MCH: 31.2 pg (ref 26.0–34.0)
MCHC: 30.3 g/dL (ref 30.0–36.0)
MCV: 103 fL — ABNORMAL HIGH (ref 80.0–100.0)
Platelets: 364 10*3/uL (ref 150–400)
RBC: 2.69 MIL/uL — ABNORMAL LOW (ref 3.87–5.11)
RDW: 18.4 % — ABNORMAL HIGH (ref 11.5–15.5)
WBC: 11.6 10*3/uL — ABNORMAL HIGH (ref 4.0–10.5)
nRBC: 0 % (ref 0.0–0.2)

## 2018-02-12 LAB — PROCALCITONIN: Procalcitonin: 0.35 ng/mL

## 2018-02-12 LAB — GLUCOSE, CAPILLARY: Glucose-Capillary: 102 mg/dL — ABNORMAL HIGH (ref 70–99)

## 2018-02-12 MED ORDER — PREDNISONE 20 MG PO TABS
20.0000 mg | ORAL_TABLET | Freq: Once | ORAL | Status: AC
  Administered 2018-02-12: 20 mg via ORAL
  Filled 2018-02-12: qty 1

## 2018-02-12 MED ORDER — PREDNISONE 10 MG PO TABS: 10.0000 mg | ORAL_TABLET | Freq: Every day | ORAL | 0 refills | Status: DC

## 2018-02-12 MED ORDER — PREDNISONE 10 MG PO TABS: 10.0000 mg | ORAL_TABLET | Freq: Every day | ORAL | 0 refills | Status: AC

## 2018-02-12 NOTE — Care Management (Signed)
This CM contacted [facility] office to alert of pt dc today. HH orders received. Marney Doctor RN,BSN [phone]

## 2018-02-12 NOTE — Evaluation (Signed)
Occupational Therapy Evaluation Patient Details Name: Ebony Scott MRN: [id number] DOB: [birth year] Today's Date: 02/12/2018    History of Present Illness 67 y.o. female with medical history significant  anxiety and depression, history of a breast cancer , cataract surgery, chronic pain, diverticulosis, fibromyalgia, GERD, Graves' disease, hypercholesterolemia, irritable bowel syndrome, hypothyroidism ,asthma and osteoporosis , history of C. difficile colitis who presented 02/10/18 with  fatigue, and a fever, diarrhea,nausea and vomiting   Clinical Impression   Pt admitted with fever.  Pain limiting pt from participating with therapy this day. RN aware.   Pt currently with functional limitations due to the deficits listed below (see OT Problem List).  Pt will benefit from skilled OT to increase their safety and independence with ADL and functional mobility for ADL to facilitate discharge to venue listed below.      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 Fx's MRI shows slight L gluteus Minimus tear early 01/23/18,  Restrictions Other Position/Activity Restrictions: WBAT while wearing B fracture boots when in hospital 01/23/18      Mobility Bed Mobility Overal bed mobility: Needs Assistance       Supine to sit: Max assist;Total assist     General bed mobility comments: Pain limiting pt  Transfers                 General transfer comment: pain limiting        ADL either performed or assessed with clinical judgement   ADL Overall ADL's : Needs assistance/impaired Eating/Feeding: Minimal assistance;Bed level Eating/Feeding Details (indicate cue type and reason): OT provided A to pt with cutting steak                                    General ADL Comments: Uable to transition to sitting EOB due to pain.  Pt not able to make it to even legs being off the bed.  Pt grimaced and screamed in pain.  Pt does nothave 24/7 A at home.  She said she had hired  A for 6 hours a day at home.  To go home pt would need muchmore A .  Reccomend SNF                  Pertinent Vitals/Pain Faces Pain Scale: Hurts worst Pain Location: L foot , every where OT touched her Pain Descriptors / Indicators: Discomfort;Grimacing;Guarding;Moaning Pain Intervention(s): Limited activity within patient's tolerance;Patient requesting pain meds-RN notified     Hand Dominance Right   Extremity/Trunk Assessment Upper Extremity Assessment Upper Extremity Assessment: Generalized weakness RUE Deficits / Details: OT observed pt this day perform AROM of BUE to 90  degrees shoulder flexion.             Communication Communication Communication: No difficulties   Cognition Arousal/Alertness: Awake/alert Behavior During Therapy: Flat affect;WFL for tasks assessed/performed Overall Cognitive Status: Within Functional Limits for tasks assessed                                 General Comments: pt stated" Not sure"   General Comments       Exercises  Shoulder Instructions      Home Living Family/patient expects to be discharged to:: Private residence Living Arrangements: Alone Available Help at Discharge: Family;Available PRN/intermittently Type of Home: House Home Access: Stairs to enter Entrance Stairs-Number of Steps: 14 Entrance Stairs-Rails: Left Home Layout: Two level;Able to live on main level with bedroom/bathroom     Bathroom Shower/Tub: Occupational psychologist: Handicapped height Bathroom Accessibility: Yes   Home Equipment: Environmental consultant - 2 wheels;Bedside commode;Shower seat - built in;Grab bars - tub/shower   Additional Comments: daughter  to come in to later today, unsure of 24/7 caregiviers       Prior Functioning/Environment                   OT Problem List: Decreased strength;Decreased activity tolerance;Pain;Decreased safety awareness;Impaired balance (sitting and/or standing);Decreased knowledge of use of DME or AE      OT Treatment/Interventions: Self-care/ADL training;Therapeutic exercise;Energy conservation;DME and/or AE instruction;Therapeutic activities;Patient/family education    OT Goals(Current goals can be found in the care plan section) Acute Rehab OT Goals Patient Stated Goal: to go home OT Goal Formulation: With patient Time For Goal Achievement: 02/26/18 Potential to Achieve Goals: Fair  OT Frequency: Min 2X/week   Barriers to D/C:               AM-PAC OT "6 Clicks" Daily Activity     Outcome Measure Help from another person eating meals?: A Little Help from another person taking care of personal grooming?: A Little Help from another person toileting, which includes using toliet, bedpan, or urinal?: Total Help from another person bathing (including washing, rinsing, drying)?: Total Help from another person to put on and taking off regular upper body clothing?: A Lot Help from another person to put on and taking off regular lower body clothing?: Total 6 Click Score: 11   End of Session Nurse Communication: Mobility status  Activity Tolerance: Patient limited by pain Patient left: in bed;with call bell/phone within reach;with bed alarm set  OT Visit Diagnosis: Muscle weakness (generalized) (M62.81);Unsteadiness on feet (R26.81)                Time: 8016-5537 OT Time Calculation (min): 22 min Charges:  OT General Charges $OT Visit: 1 Visit OT Evaluation $OT Eval Moderate Complexity: 1 Mod  Kari Baars, [place] [phone] Office- [phone], Edwena Felty D 02/12/2018, 12:59 PM

## 2018-02-12 NOTE — Progress Notes (Addendum)
PROGRESS NOTE    Ebony Scott  ZOX:[id number] DOB: 12-30-1950 DOA: 02/10/2018 PCP: [name], MD    Brief Narrative:67 y.o.femalewith medical history significant ofabove medical comorbidities including allergic rhinitis, anxiety and depression, history of a breast cancer in remission, history of cataract surgery, chronic pain, diverticulosis, fibromyalgia, GERD, Graves' disease, hypercholesterolemia, irritable bowel syndrome,hypothyroidismmoderate persistent asthma and osteoporosis along with history of C. difficile colitis who presented today with a history of fatigue, and a fever of 103 with associated nausea and vomiting.States that she had a fever of 103 2 days ago and started having nausea or vomiting. Constantly coughing up and more so lately. Statesshe stays cold and had a history of a blood clot in her right leg into the lungs. She has been taking antibiotics in the outpatient setting for her right forearm wound. No nausea last couple days but started today. States she is limping a lot of coughing but denies any chest pain. Does admit to having some shortness of breath and did admit to having some loose watery diarrhea have been 3-4 times a day. Has had C. difficile in the past and had formed movements the last 2 to 3 weeks but had loose bowel movements today and does not know if it is her irritable bowel syndrome. Because of her fever her son brought her in to the emergency room for further evaluation recommendations and patient was called to admit this patient for fever, diarrhea and emesis associate with abdominal pain. Gust the case with Dr. Dia Sitter infectious diseases and we have low suspicion for C. difficile as of now and recommended not starting empiric treatment but obtaining further work-up  ED Course:In the ED patient basic blood work done as well as blood cultures and urinalysis. She is given a liter of normal saline. Also given IV vancomycin for her  Rightforearm.   Assessment & Plan:   Active Problems:   Hyperlipidemia   Seasonal and perennial allergic rhinitis   GERD   Irritable bowel syndrome   BREAST CANCER, HX OF   Cough   Acute deep vein thrombosis (DVT) of right lower extremity (HCC)   History of breast cancer   Chronic pain syndrome   Fibromyalgia   Leukocytosis   DVT (deep venous thrombosis) (HCC)   Depression   Fever   SIRS (systemic inflammatory response syndrome) (HCC)   Nausea and vomiting   Diarrhea   Abdominal pain   Cellulitis of forearm   #1 fever patient admitted with fever at home but has not had any fever since admission.  Work-up included CT of the chest abdomen pelvis blood cultures chest x-ray and UA which showed no evidence of infection.  However she received Ancef overnight empirically for possible right upper extremity cellulitis may be.  She remained afebrile with no other complaints.  No nausea vomiting diarrhea.  Patient has been seen by wound care team and is recommended Xeroform nonadherent dressing which also has topical antibacterial property daily upon discharge.  Gerharts butt cream to be applied incontinence associated dermatitis.  Patient was seen by physical therapy and she refused to go to SNF she will be discharged home with home PT.her discharge was cancelled for today due to temp 101.7 and rigors.continue ancef.  #2 history of DVT continue Xarelto  #3 depression anxiety and fibromyalgia continue home meds  Severity patient continues to spike fever and with very decreased p.o. intake and dehydration we will continue IV hydration and monitor overnight.  She is at high risk  for sepsis with her immunocompromise state.  Estimated body mass index is 22.26 kg/m as calculated from the following:   Height as of 02/07/18: 5\' 2"  (1.575 m).   Weight as of this encounter: 55.2 kg.  DVT prophylaxis: Lovenox Code Status: Full code Family Communication: Discussed with daughter Disposition  Plan: Pending clinical improvement patient still needs IV hydration with very decreased p.o. intake and ongoing fever Consultants:  Spoke to infectious disease over the phone  Procedures: None Antimicrobials: Ancef  Subjective: Reports feeling much weaker decreased appetite not able to get out of bed by herself   Objective: Vitals:   02/11/18 1945 02/11/18 2240 02/12/18 0528 02/12/18 0859  BP: (!) 123/54  136/62   Pulse: 93  92   Resp: 20  20   Temp: 98.9 F (37.2 C)  100 F (37.8 C) 99.1 F (37.3 C)  TempSrc:   Oral Oral  SpO2: 96% 98% 98%   Weight:   55.2 kg     Intake/Output Summary (Last 24 hours) at 02/12/2018 1045 Last data filed at 02/12/2018 0658 Gross per 24 hour  Intake 240 ml  Output 500 ml  Net -260 ml   Filed Weights   02/11/18 0425 02/12/18 0528  Weight: 52.7 kg 55.2 kg    Examination:  General exam: Appears mild distress secondary to pain Respiratory system: Clear to auscultation. Respiratory effort normal. Cardiovascular system: S1 & S2 heard, RRR. No JVD, murmurs, rubs, gallops or clicks. No pedal edema. Gastrointestinal system: Abdomen is nondistended, soft and nontender. No organomegaly or masses felt. Normal bowel sounds heard. Central nervous system: Alert and oriented. No focal neurological deficits. Extremities: Symmetric 5 x 5 power. Skin: No rashes, lesions or ulcers Psychiatry: Judgement and insight appear normal. Mood & affect appropriate.     Data Reviewed: I have personally reviewed following labs and imaging studies  CBC: Recent Labs  Lab 02/10/18 0909 02/11/18 0635 02/12/18 0656  WBC 12.9* 11.9* 11.6*  NEUTROABS 9.5*  --   --   HGB 8.8* 8.9* 8.4*  HCT 28.4* 29.2* 27.7*  MCV 99.6 101.0* 103.0*  PLT 359 373 637   Basic Metabolic Panel: Recent Labs  Lab 02/07/18 1156 02/10/18 0909 02/10/18 1653 02/11/18 0635 02/12/18 0656  NA 142 137  --  140 137  K 3.7 3.4*  --  4.1 4.2  CL 105 105  --  111 106  CO2 24 20*  --   17* 20*  GLUCOSE 93 128*  --  82 104*  BUN 16 10  --  9 8  CREATININE 1.11* 0.88  --  0.73 0.79  CALCIUM 9.3 7.9*  --  7.7* 8.0*  MG  --   --  1.4*  --   --   PHOS  --   --  2.1*  --   --    GFR: Estimated Creatinine Clearance: 52.5 mL/min (by C-G formula based on SCr of 0.79 mg/dL). Liver Function Tests: Recent Labs  Lab 02/07/18 1156 02/10/18 0909 02/11/18 0635 02/12/18 0656  AST 18 22 23  53*  ALT 34* 22 19 35  ALKPHOS  --  60 56 65  BILITOT 0.3 1.1 0.5 0.2*  PROT 6.0* 6.0* 5.7* 5.8*  ALBUMIN  --  2.4* 2.2* 2.1*   No results for input(s): LIPASE, AMYLASE in the last 168 hours. No results for input(s): AMMONIA in the last 168 hours. Coagulation Profile: No results for input(s): INR, PROTIME in the last 168 hours. Cardiac Enzymes: No results for  input(s): CKTOTAL, CKMB, CKMBINDEX, TROPONINI in the last 168 hours. BNP (last 3 results) No results for input(s): PROBNP in the last 8760 hours. HbA1C: No results for input(s): HGBA1C in the last 72 hours. CBG: Recent Labs  Lab 02/11/18 0749 02/12/18 0742  GLUCAP 82 102*   Lipid Profile: No results for input(s): CHOL, HDL, LDLCALC, TRIG, CHOLHDL, LDLDIRECT in the last 72 hours. Thyroid Function Tests: Recent Labs    02/10/18 1756  TSH 2.688   Anemia Panel: No results for input(s): VITAMINB12, FOLATE, FERRITIN, TIBC, IRON, RETICCTPCT in the last 72 hours. Sepsis Labs: Recent Labs  Lab 02/10/18 0915 02/10/18 1309 02/10/18 1614 02/11/18 0635 02/12/18 0614  PROCALCITON  --   --  0.41 0.46 0.35  LATICACIDVEN 1.26 0.92  --   --   --     Recent Results (from the past 240 hour(s))  Blood Culture (routine x 2)     Status: None (Preliminary result)   Collection Time: 02/10/18 10:25 AM  Result Value Ref Range Status   Specimen Description BLOOD RIGHT ANTECUBITAL  Final   Special Requests   Final    BOTTLES DRAWN AEROBIC AND ANAEROBIC Blood Culture results may not be optimal due to an inadequate volume of blood  received in culture bottles Performed at [facility], [place] [address] [place], [place] [address]    Culture NO GROWTH 2 DAYS  Final   Report Status PENDING  Incomplete  Blood Culture (routine x 2)     Status: None (Preliminary result)   Collection Time: 02/10/18 10:25 AM  Result Value Ref Range Status   Specimen Description BLOOD LEFT ANTECUBITAL  Final   Special Requests   Final    BOTTLES DRAWN AEROBIC AND ANAEROBIC Blood Culture results may not be optimal due to an inadequate volume of blood received in culture bottles Performed at [facility], [place] [address], [place], [place] [address]    Culture NO GROWTH 2 DAYS  Final   Report Status PENDING  Incomplete         Radiology Studies: Ct Abdomen Pelvis Wo Contrast  Result Date: 02/10/2018 CLINICAL DATA:  Fever, nausea/vomiting. Cough. History of right leg DVT and PE. History of breast cancer in remission. History of C diff. EXAM: CT CHEST, ABDOMEN AND PELVIS WITHOUT CONTRAST TECHNIQUE: Multidetector CT imaging of the chest, abdomen and pelvis was performed following the standard protocol without IV contrast. COMPARISON:  CT abdomen/pelvis dated 01/15/2018. CTA chest dated 12/13/2017. FINDINGS: CT CHEST FINDINGS Cardiovascular: The heart is normal in size. No pericardial effusion. No evidence thoracic aortic aneurysm. Atherosclerotic calcifications of the aortic arch. Coronary atherosclerosis the LAD and left coronary artery. Mediastinum/Nodes: No suspicious mediastinal lymphadenopathy. Lungs/Pleura: Minimal dependent atelectasis in the posterior right upper and bilateral lower lobes. Mild scarring/atelectasis in the inferior right middle lobe and lingula. No focal consolidation. No suspicious pulmonary nodules. 4 mm triangular subpleural nodule in the lateral left lung base (series 4/image 102), unchanged from 2018, benign. No pleural effusion or pneumothorax. Musculoskeletal: Visualized osseous  structures are within normal limits. CT ABDOMEN PELVIS FINDINGS Hepatobiliary: Unenhanced liver is unremarkable. Status post cholecystectomy. No intrahepatic or extrahepatic ductal dilatation. Pancreas: Within normal limits. Spleen: Within normal limits. Adrenals/Urinary Tract: Adrenal glands are within normal limits. Kidneys are grossly unremarkable. No renal, ureteral, or bladder calculi. No hydronephrosis. Bladder is within normal limits. Stomach/Bowel: Stomach is within normal limits. No evidence of bowel obstruction. Normal appendix (series 2/image 91). No colonic wall thickening or inflammatory  changes. Vascular/Lymphatic: No evidence of abdominal aortic aneurysm. Atherosclerotic calcifications of the abdominal aorta and branch vessels. No suspicious abdominopelvic lymphadenopathy. Reproductive: Status post hysterectomy. Ovaries within normal limits.  No right adnexal mass. Other: No abdominopelvic ascites. Musculoskeletal: Avascular necrosis of the bilateral femoral heads, better evaluated on recent prior MRI. Mild degenerative changes at L4-5. IMPRESSION: No colonic wall thickening or inflammatory changes to suggest infectious/inflammatory colitis. No CT findings to account for the patient's fever. Additional ancillary findings as above. Electronically Signed   By: Julian Hy M.D.   On: 02/10/2018 19:40   Dg Forearm Right  Result Date: 02/10/2018 CLINICAL DATA:  Fall, skin abrasion/laceration, now with redness/swelling, possible cellulitis EXAM: RIGHT FOREARM - 2 VIEW COMPARISON:  None. FINDINGS: No fracture or dislocation is seen. The joint spaces are preserved. Visualized soft tissues are within normal limits. Postsurgical changes at the elbow. No radiopaque foreign body is seen. IMPRESSION: No fracture, dislocation, or radiopaque foreign body is seen. Electronically Signed   By: Julian Hy M.D.   On: 02/10/2018 17:55   Ct Chest Wo Contrast  Result Date: 02/10/2018 CLINICAL DATA:   Fever, nausea/vomiting. Cough. History of right leg DVT and PE. History of breast cancer in remission. History of C diff. EXAM: CT CHEST, ABDOMEN AND PELVIS WITHOUT CONTRAST TECHNIQUE: Multidetector CT imaging of the chest, abdomen and pelvis was performed following the standard protocol without IV contrast. COMPARISON:  CT abdomen/pelvis dated 01/15/2018. CTA chest dated 12/13/2017. FINDINGS: CT CHEST FINDINGS Cardiovascular: The heart is normal in size. No pericardial effusion. No evidence thoracic aortic aneurysm. Atherosclerotic calcifications of the aortic arch. Coronary atherosclerosis the LAD and left coronary artery. Mediastinum/Nodes: No suspicious mediastinal lymphadenopathy. Lungs/Pleura: Minimal dependent atelectasis in the posterior right upper and bilateral lower lobes. Mild scarring/atelectasis in the inferior right middle lobe and lingula. No focal consolidation. No suspicious pulmonary nodules. 4 mm triangular subpleural nodule in the lateral left lung base (series 4/image 102), unchanged from 2018, benign. No pleural effusion or pneumothorax. Musculoskeletal: Visualized osseous structures are within normal limits. CT ABDOMEN PELVIS FINDINGS Hepatobiliary: Unenhanced liver is unremarkable. Status post cholecystectomy. No intrahepatic or extrahepatic ductal dilatation. Pancreas: Within normal limits. Spleen: Within normal limits. Adrenals/Urinary Tract: Adrenal glands are within normal limits. Kidneys are grossly unremarkable. No renal, ureteral, or bladder calculi. No hydronephrosis. Bladder is within normal limits. Stomach/Bowel: Stomach is within normal limits. No evidence of bowel obstruction. Normal appendix (series 2/image 91). No colonic wall thickening or inflammatory changes. Vascular/Lymphatic: No evidence of abdominal aortic aneurysm. Atherosclerotic calcifications of the abdominal aorta and branch vessels. No suspicious abdominopelvic lymphadenopathy. Reproductive: Status post  hysterectomy. Ovaries within normal limits.  No right adnexal mass. Other: No abdominopelvic ascites. Musculoskeletal: Avascular necrosis of the bilateral femoral heads, better evaluated on recent prior MRI. Mild degenerative changes at L4-5. IMPRESSION: No colonic wall thickening or inflammatory changes to suggest infectious/inflammatory colitis. No CT findings to account for the patient's fever. Additional ancillary findings as above. Electronically Signed   By: Julian Hy M.D.   On: 02/10/2018 19:40        Scheduled Meds: . azelastine  1 spray Each Nare BID  . cephALEXin  500 mg Oral Q8H  . diclofenac sodium  2 g Topical BID  . fluticasone  2 spray Each Nare Daily  . gabapentin  600 mg Oral Daily  . Gerhardt's butt cream   Topical QID  . guaiFENesin  1,200 mg Oral BID  . ipratropium-albuterol  3 mL Nebulization BID  . loratadine  10 mg Oral Daily  . mirtazapine  30 mg Oral QHS  . nystatin  5 mL Mouth/Throat QHS  . predniSONE  20 mg Oral Once  . rivaroxaban  20 mg Oral Q supper  . saccharomyces boulardii  250 mg Oral BID   Continuous Infusions: . sodium chloride 1,000 mL (02/11/18 2205)     LOS: 0 days     Georgette Shell, MD [facility] If 7PM-7AM, please contact night-coverage [email or web address] Password [email or web address] 02/12/2018, 10:45 AM

## 2018-02-13 LAB — COMPLETE METABOLIC PANEL WITH GFR
AG Ratio: 1.1 (calc) (ref 1.0–2.5)
ALT: 34 U/L — ABNORMAL HIGH (ref 6–29)
AST: 18 U/L (ref 10–35)
Albumin: 3.2 g/dL — ABNORMAL LOW (ref 3.6–5.1)
Alkaline phosphatase (APISO): 66 U/L (ref 33–130)
BUN/Creatinine Ratio: 14 (calc) (ref 6–22)
BUN: 16 mg/dL (ref 7–25)
CO2: 24 mmol/L (ref 20–32)
Calcium: 9.3 mg/dL (ref 8.6–10.4)
Chloride: 105 mmol/L (ref 98–110)
Creat: 1.11 mg/dL — ABNORMAL HIGH (ref 0.50–0.99)
GFR, Est African American: 59 mL/min/{1.73_m2} — ABNORMAL LOW (ref >=60)
GFR, Est Non African American: 51 mL/min/{1.73_m2} — ABNORMAL LOW (ref >=60)
Globulin: 2.8 g/dL (calc) (ref 1.9–3.7)
Glucose, Bld: 93 mg/dL (ref 65–99)
Potassium: 3.7 mmol/L (ref 3.5–5.3)
Sodium: 142 mmol/L (ref 135–146)
Total Bilirubin: 0.3 mg/dL (ref 0.2–1.2)
Total Protein: 6 g/dL — ABNORMAL LOW (ref 6.1–8.1)

## 2018-02-13 LAB — ANA: Anti Nuclear Antibody(ANA): NEGATIVE

## 2018-02-13 LAB — ANTI-SMOOTH MUSCLE ANTIBODY, IGG: Actin (Smooth Muscle) Antibody (IGG): <20 U (ref <20)

## 2018-02-13 LAB — MITOCHONDRIAL ANTIBODIES: Mitochondrial M2 Ab, IgG: <=20 U

## 2018-02-15 LAB — CULTURE, BLOOD (ROUTINE X 2)
Culture: NO GROWTH
Culture: NO GROWTH

## 2018-02-17 ENCOUNTER — Ambulatory Visit: Payer: Medicare Other

## 2018-02-17 ENCOUNTER — Ambulatory Visit: Payer: Medicare Other | Admitting: Family Medicine

## 2018-02-19 ENCOUNTER — Encounter: Payer: Self-pay | Admitting: Family Medicine

## 2018-02-21 ENCOUNTER — Ambulatory Visit: Payer: Medicare Other | Admitting: Family Medicine

## 2018-02-21 ENCOUNTER — Encounter: Payer: Self-pay | Admitting: Family Medicine

## 2018-02-21 VITALS — BP 130/56 | HR 94 | Temp 98.8°F | Resp 16 | Ht 62.0 in | Wt 122.0 lb

## 2018-02-21 DIAGNOSIS — R61 Generalized hyperhidrosis: Secondary | ICD-10-CM

## 2018-02-21 DIAGNOSIS — R945 Abnormal results of liver function studies: Secondary | ICD-10-CM | POA: Diagnosis not present

## 2018-02-21 DIAGNOSIS — D649 Anemia, unspecified: Secondary | ICD-10-CM

## 2018-02-21 DIAGNOSIS — Z09 Encounter for follow-up examination after completed treatment for conditions other than malignant neoplasm: Secondary | ICD-10-CM

## 2018-02-21 DIAGNOSIS — M2550 Pain in unspecified joint: Secondary | ICD-10-CM | POA: Diagnosis not present

## 2018-02-21 DIAGNOSIS — L03119 Cellulitis of unspecified part of limb: Secondary | ICD-10-CM

## 2018-02-21 DIAGNOSIS — A0472 Enterocolitis due to Clostridium difficile, not specified as recurrent: Secondary | ICD-10-CM

## 2018-02-21 DIAGNOSIS — R7989 Other specified abnormal findings of blood chemistry: Secondary | ICD-10-CM

## 2018-02-21 NOTE — Progress Notes (Signed)
Subjective:    Patient ID: Ebony Scott, female    DOB: 1950/06/21, 68 y.o.   MRN: [id number]  HPI  She was recently admitted to the hospital having been found with altered mental status lying in the floor for several days.  She was found to have a RLE DVT and  Small bilateral PE's was also found to be in rhabdomyolysis.  His discharge from the hospital on November 4 to a skilled nursing facility.  She was recently readmitted on November 26 with nausea vomiting and diarrhea and was found to have C. difficile colitis there was presumed to be acquired in the nursing home.  I have copied relevant portions of the discharge summary below and included them for my reference: Admit date: 01/14/2018 Discharge date: 01/23/2018  Admitted From: Home Disposition: Home  Recommendations for Outpatient Follow-up:  1. Follow up with PCP in 1week with repeat CBC/CMP 2. Follow-up with Dr. Sharol Given as an outpatient  3. Patient will benefit from outpatient evaluation by GI 4. Follow-up with ED if symptoms worsen or new appear   Home Health: Yes: PT/RN Equipment/Devices: None Discharge Condition: Stable CODE STATUS: Full Diet recommendation: Heart Healthy  Brief/Interim Summary: 68 year old female with history of allergic rhinitis, seasonal allergies, asthma, anemia, anxiety, depression, also arthritis, pseudogout, breast cancer in remission, cataracts, chronic lower back pain, chronic neck pain, bilateral DVTs, diverticulosis, fibromyalgia, GERD, Graves' disease, hyperthyroidism, IBS, osteoporosis, pneumonia who was recently admitted to [facility] for acute PE and DVT and was started on anticoagulation and was discharged to skilled nursing facility presented with worsening GI symptoms with nausea, vomiting and diarrhea. She was started on oral vancomycin for positive C. difficile antigen. She had significant left hip pain which is thought to be pseudogout and was started on steroids.  Her condition has improved.   She will be discharged home on oral vancomycin and steroids.  Discharge Diagnoses:  Principal Problem:   Clostridium difficile colitis Active Problems:   Hyperthyroidism   GERD   Pseudogout   Chronic diastolic CHF (congestive heart failure) (HCC)   CKD (chronic kidney disease), stage III (HCC)   Debility   Clostridium difficile diarrhea   DVT (deep venous thrombosis) (HCC)   Depression   Closed fracture of fifth metatarsal bone of right foot, initial encounter   Fracture of base of fifth metatarsal bone with routine healing, left  C. difficile colitis -Diarrhea improving.   Currently on oral vancomycin.  Finish 14-day course of oral vancomycin -Currently afebrile. -Discharge patient home  Left hip pain most likely due to acute pseudogout -X-ray/CT scan of the pelvis was negative for acute abnormality. MRI showed small joint effusion and also partial tear of the gluteus minimus with edema. These findings were discussed with orthopedic surgeon Dr. Cheri Fowler prior hospitalist who recommended arthrocentesis. Patient underwent arthrocentesis on 01/01/2018 however only had minimal amount of clear fluid yellow fluid was aspirated and it was not enough for cell counts or crystal examination. Cultures have been negative so far. Patient had extremely elevated CRP at 15.8. CRP improving  at 6.5 on 01/22/2018. -Patient was empirically started on intravenous Solu-Medrol.   It has been switched to oral prednisone from today.  She feels better.  Will discharge on oral prednisone 40 mg daily for 7 days.  Outpatient follow-up with PCP and/or orthopedics -PT recommended SNF but patient refused.  Patient will need home health upon discharge.  Bilateral fifth metatarsal closed fracture -Orthopedic evaluation by Dr. Sharol Given appreciated.  Patient will need to  wear fracture boots as per Dr. Sharol Given.  Outpatient follow-up with Dr. Sharol Given.    Abnormal LFTs -Improving -LFTs were elevated in November 25, 2017 as  well for which right upper quadrant ultrasound/CT abdomen was negative for any acute findings. She is status post cholecystectomy. No worsening abdominal pain -Off Singulair and statin -Outpatient follow-up  History of chronic diastolic CHF -Compensated. Of IV fluids.   Outpatient follow-up  Chronic kidney disease stage III -Stable.   Outpatient follow-up  Generalized deconditioning -Continue PT eval. Patient is declining SNF placement.  Discharge home with home health.  Macrocytic anemia -Hemoglobin stable.   Outpatient follow-up  History of acute PE and DVT diagnosed recently -Continue Xarelto  Depression -Continue Remeron  Hyperthyroidism -No longer on methimazole. TSH was 5 in October 2019. Outpatient monitor  Severe protein calorie malnutrition -Outpatient follow-up   01/24/18 She is here today for follow-up.  She is on day 9 of 14 of vancomycin 4 times daily.  She states that she is feeling much better and the diarrhea has improved.  The pain in her left hip is also improved.  She is currently on 40 mill grams a day of prednisone for pseudogout.  I recommended decreasing the prednisone to 20 mg a day over the next 3 days and then discontinuing altogether.  She is not currently taking a probiotic and I recommended that she had Florastor.  She denies any fevers or chills.  She denies any abdominal pain.  She denies any hematochezia.  She is accompanied today by her daughter.  We had a long discussion about her polypharmacy.  The majority of her medications or over-the-counter supplements that the patient has elected to take on her own.  At present I am prescribing Remeron for depression and insomnia.  I have recommended that she stay on Xarelto indefinitely given her previous history of DVTs and now a second unprovoked DVT.  I am also prescribing gabapentin 300 mg p.o. twice daily for neuropathic pain and chronic low back pain.  She believes she benefits from this.   I recommended that she discontinue her statin as well as any Tylenol due to her elevated liver function test.  She is receiving her pulmonary medication from her pulmonologist.  She is on allergy medication through the care of her allergist.  Her only on a proton pump inhibitor as well as an H2 blocker.  Believe this was started due to her respiratory issues.  I believe the patient can discontinue these medications if she is not experiencing reflux.  At that time, my plan was: C. difficile colitis seems to be symptomatically improving.  Complete vancomycin but add Florastor 250 mg p.o. twice daily as a probiotic.  I recommended that she continue Xarelto 20 mg a day indefinitely.  She has a history of DVT.  Now she has a history of an unprovoked DVT and bilateral PEs causing syncope.  Therefore I recommended that her risk of current DVT is too high and that she stay on long-term anticoagulation.  My biggest concern moving forward is her persistently elevated LFTs.  Yesterday in the emergency room, they were found to be still persistently elevated in the 100-200 range.  She has had a right upper quadrant ultrasound as well as a CT scan of the abdomen and pelvis to see no visible abnormality in the liver.  Viral hepatitis serologies have been negative.  I would like the patient to come back in 2 weeks to repeat her liver function test when  she is not acutely ill.  If persistently elevated, I would recommend screen the patient for alpha-1 antitrypsin deficiency given her pulmonary issues as well as checking for autoimmune hepatitis and hemochromatosis.  If no other explanation is discovered, I would recommend a GI consultation to discuss possible biopsy.  02/07/18 Recently suffered a skin tear to her right arm on the dorsum of her right forearm.  Over the last week she has been performing dressing changes at home with the care of in-home nursing.  She has now developed fevers as high as 101.  The dorsum of the right  arm is erythematous, hot to the touch, there is a weeping superficial ulcer on the dorsum of the right arm.  It appears to be a large skin tear that is approximately 10 x 5 cm.  It is oozing green-yellowish purulent foul-smelling material.  There is erythema spreading around the wound to a total diameter of 17 cm x 8 cm.  She appears to a developed a secondary cellulitis.  At that time, my plan was: The wound was covered in Silvadene, covered with nonadherent gauze, and wrapped gently and Covan.  Wound care was discussed.  Recommend doxycycline 100 mg p.o. twice daily for 10 days given her history of an allergy to penicillin.  This should allow strep and staph coverage including MRSA.  Given her recent elevations in liver function test, I will check a CMP to monitor her liver function test, check an ANA as well as antimitochondrial antibodies and anti-smooth muscle antibodies to evaluate for autoimmune hepatitis.  I have recommended using Tylenol sparingly for fever if greater than 101.  Given Xarelto I want her to avoid NSAIDs however if the fever rises greater than 101 and occasional use of ibuprofen for fever would be reasonable.  02/21/2018 Patient presents today with several concerns.  First issue is the cellulitis on her right forearm.  After I saw the patient, she continued to have fevers and was ultimately admitted to the hospital the following Monday.  In the hospital she remained afebrile.  She was given vancomycin and Ancef overnight and then was discharged home.  She has now completed the doxycycline I prescribed to her.  Blood cultures were negative in the hospital.  The wound on the right forearm has completely healed.  The ulcer and abrasion looks much better.  There is some postinflammatory hyperpigmentation that is pink but there is no obvious erythema or warmth or redness or pain.  Therefore this issue is closed.  Second issue is the elevated LFTs that I mentioned at her first visit.  ANA was  negative.  Anti-smooth muscle antibodies were negative.  There was no evidence of autoimmune hepatitis.  Her most recent liver function test have normalized.  Therefore I do not believe that there is any autoimmune hepatitis or other ongoing issue with her liver.  Third issue with the C. difficile colitis.  She has discontinued oral vancomycin.  She has had no further diarrhea.  She denies any abdominal pain.  Therefore this issue is closed.  Fourth issue is polyarthralgias.  She continues to remain on prednisone.  This was prescribed to her in the hospital.  I am concerned about her already fragile bone health as she has been diagnosed with osteoporosis.  At the present time she is in bilateral Cam walkers due to fifth metatarsal fractures.  Therefore I believe prolonged prednisone is only can exacerbate this issue.  However we are unaware of what is causing her  diffuse body pain.  The pain is primarily in her feet.  At other times will move to her hands and her finger joints.  It will the fingers become red hot and swollen according to the patient.  She takes prednisone, the symptoms are controlled well.  Fifth issue are night sweats.  She states that she has been having night sweats.  This is become an issue after she left the hospital.  She denies any fever since leaving the hospital.  Sixth issue is anemia.  Her hemoglobin dropped to 8.8 while in the hospital.  Patient is on Xarelto for DVT.  She denies any melena or hematochezia.  She denies any hematemesis.  Therefore this is anemia of unknown origin.  Past Medical History:  Diagnosis Date  . Allergic rhinitis   . Allergy    SEASONAL  . Anemia   . Anxiety    pt denies  . Arthritis    "mostly the hands" (02/20/2012)  . Asthma   . Breast cancer ([place]) 1998   in remission  . Cataract    REMOVED  . Chronic lower back pain   . Chronic neck pain   . Complication of anesthesia    "had hard time waking up from it several times" (02/20/2012)  .  Depression    "some; don't take anything for it" (02/20/2012)  . Diverticulosis   . DVT (deep venous thrombosis) ([place])   . Exertional dyspnea   . Fibromyalgia 11/2011  . GERD (gastroesophageal reflux disease)   . Graves disease   . Headache(784.0)    "related to allergies; more at different times during the year" (02/20/2012)  . Hemorrhoids   . Hiatal hernia    back and neck  . Hx of adenomatous colonic polyps 04/12/2016  . Hypercholesteremia    good cholesterol is high  . Hypothyroidism   . IBS (irritable bowel syndrome)   . Moderate persistent asthma    -FeV1 72% 2011, -IgE 102 2011, CT sinus Neg 2011  . Osteoporosis    on reclast yearly  . Pneumonia 04/2011; ~ 11/2011   "double; single" (02/20/2012)   Past Surgical History:  Procedure Laterality Date  . ANTERIOR AND POSTERIOR REPAIR  1990's  . APPENDECTOMY    . BREAST LUMPECTOMY  1998   left  . CARPOMETACARPEL ([place]) FUSION OF THUMB WITH AUTOGRAFT FROM RADIUS  ~ 2009   "both thumbs" (02/20/2012)  . CATARACT EXTRACTION W/ INTRAOCULAR LENS  IMPLANT, BILATERAL  2012  . CERVICAL DISCECTOMY  10/2001   C5-C6  . CERVICAL FUSION  2003   C3-C4  . CHOLECYSTECTOMY    . COLONOSCOPY    . DEBRIDEMENT TENNIS ELBOW  ?1970's   right  . ESOPHAGOGASTRODUODENOSCOPY    . HYSTERECTOMY    . KNEE ARTHROPLASTY  ?1990's   "?right; w/cartilage repair" (02/20/2012)  . NASAL SEPTUM SURGERY  1980's  . POSTERIOR CERVICAL FUSION/FORAMINOTOMY  2004   "failed initial fusion; rewired  anterior neck" (02/20/2012)  . TONSILLECTOMY  ~ 1953  . VESICOVAGINAL FISTULA CLOSURE W/ TAH  1988  . VIDEO BRONCHOSCOPY Bilateral 08/23/2016   Procedure: VIDEO BRONCHOSCOPY WITH FLUORO;  Surgeon: Javier Glazier, MD;  Location: [facility];  Service: Cardiopulmonary;  Laterality: Bilateral;   Current Outpatient Medications on File Prior to Visit  Medication Sig Dispense Refill  . albuterol (PROAIR HFA) 108 (90 Base) MCG/ACT inhaler Inhale 2 puffs into the lungs every 4  (four) hours as needed for wheezing or shortness of breath. 18 g 5  .  Alpha-D-Galactosidase (BEANO PO) Take 1 capsule by mouth 3 (three) times daily as needed (gas/ bloating).     . Alpha-Lipoic Acid 600 MG CAPS Take 600 mg by mouth daily.     Marland Kitchen alum & mag hydroxide-simeth (MAALOX/MYLANTA) 200-200-20 MG/5ML suspension Take 15 mLs by mouth every 4 (four) hours as needed for indigestion or heartburn. 355 mL 0  . azelastine (ASTELIN) 0.1 % nasal spray USE 2 SPRAYS IN EACH NOSTRIL DAILY AS DIRECTED 30 mL 5  . CALCIUM-MAGNESIUM PO Take 1 tablet by mouth at bedtime.    . cetirizine (ZYRTEC) 10 MG tablet Take 5 mg by mouth every evening.     . cholecalciferol (VITAMIN D) 25 MCG (1000 UT) tablet Take 1,000 Units by mouth daily.     . cyproheptadine (PERIACTIN) 4 MG tablet TAKE 2 TABLETS BY MOUTH EVERY EVENING (Patient taking differently: TAKE 1 TABLET BY MOUTH EVERY EVENING) 60 tablet 5  . denosumab (PROLIA) 60 MG/ML SOLN injection Inject 60 mg into the skin every 6 (six) months. Administer in upper arm, thigh, or abdomen    . dexlansoprazole (DEXILANT) 60 MG capsule Take 1 capsule (60 mg total) by mouth daily. 30 capsule 5  . diclofenac sodium (VOLTAREN) 1 % GEL Apply 2 g topically 2 (two) times daily.    Marland Kitchen EPINEPHrine (EPIPEN 2-PAK) 0.3 mg/0.3 mL IJ SOAJ injection Inject 0.3 mLs (0.3 mg total) into the muscle Once PRN for up to 1 dose. 1 Device 1  . gabapentin (NEURONTIN) 600 MG tablet Take 600 mg by mouth at bedtime.    Marland Kitchen Hydrocortisone (GERHARDT'S BUTT CREAM) CREA Apply 1 application topically 4 (four) times daily. 1 each 1  . Lactase (LACTAID FAST ACT) 9000 units TABS Take 18,000 Units by mouth 4 (four) times daily as needed (dairy consumption).     Marland Kitchen lidocaine (LIDODERM) 5 % Place 1 patch onto the skin daily as needed (pain). Remove & Discard patch within 12 hours or as directed by MD    . mirtazapine (REMERON SOL-TAB) 30 MG disintegrating tablet DISSOLVE 1 TABLET IN MOUTH EVERY NIGHT AT BEDTIME 30  tablet 5  . mometasone (NASONEX) 50 MCG/ACT nasal spray Place 2 sprays into the nose daily.    . mupirocin ointment (BACTROBAN) 2 % Place 1 application into the nose at bedtime. To prevent nose bleeds    . nystatin (MYCOSTATIN) 100000 UNIT/ML suspension Use as directed 5 mLs (500,000 Units total) in the mouth or throat at bedtime. Swish and spit    . predniSONE (DELTASONE) 10 MG tablet Take 1 tablet (10 mg total) by mouth daily for 14 days. 14 tablet 0  . PRESCRIPTION MEDICATION Place 2 puffs into both ears once a week. Place capsule in dispenser and instill 2 puffs in each ear once a week after washing hair. CS (X) AHCL    CHLORAMP/SULFU/AMPHO B/ H DROC 100-100-5 mg capsule - compounded at [facility]    . Probiotic Product (ALIGN) 4 MG CAPS Take 4 mg by mouth every evening.     Marland Kitchen Propylene Glycol-Glycerin (SOOTHE OP) Place 1 drop into both eyes 2 (two) times daily as needed (dry eyes).     . ranitidine (ZANTAC) 300 MG tablet Take 1 tablet (300 mg total) by mouth every evening.    Marland Kitchen Respiratory Therapy Supplies (FLUTTER) DEVI Use as directed 1 each 0  . rivaroxaban (XARELTO) 20 MG TABS tablet Take 1 tablet (20 mg total) by mouth daily with supper. 90 tablet 3  . saccharomyces boulardii (  FLORASTOR) 250 MG capsule Take 1 capsule (250 mg total) by mouth 2 (two) times daily. 60 capsule 2  . silver sulfADIAZINE (SILVADENE) 1 % cream Apply 1 application topically daily. 50 g 0  . Spacer/Aero-Holding Chambers (AEROCHAMBER MV) inhaler Use as instructed 1 each 0  . Tiotropium Bromide Monohydrate (SPIRIVA RESPIMAT) 2.5 MCG/ACT AERS Inhale 2 puffs into the lungs daily. 1 Inhaler 5   No current facility-administered medications on file prior to visit.    Allergies  Allergen Reactions  . Dust Mite Extract Shortness Of Breath and Other (See Comments)    "sneezing" (02/20/2012)  . Molds & Smuts Shortness Of Breath  . Morphine Sulfate Itching  . Other Shortness Of Breath and Other (See Comments)    Grass  and weeds "sneezing; filled sinuses" (02/20/2012)  . Penicillins Rash and Other (See Comments)    "welts" (02/20/2012) Has patient had a PCN reaction causing immediate rash, facial/tongue/throat swelling, SOB or lightheadedness with hypotension: Unknown Has patient had a PCN reaction causing severe rash involving mucus membranes or skin necrosis: No Has patient had a PCN reaction that required hospitalization: No Has patient had a PCN reaction occurring within the last 10 years: No If all of the above answers are "NO", then may proceed with Cephalosporin use.   Marland Kitchen Reclast [Zoledronic Acid] Other (See Comments)    Fever, Put in hospital, dr said it was a reaction from a reaction   . Rofecoxib Swelling    Vioxx REACTION: feet swelling  . Shrimp Flavor Anaphylaxis    ALL SHELLFISH  . Tetracycline Hcl Nausea And Vomiting  . Xolair [Omalizumab] Other (See Comments)    Caused Blood clot  . Dilaudid [Hydromorphone Hcl] Itching  . Hydrocodone-Acetaminophen Nausea And Vomiting  . Levofloxacin Other (See Comments)    REACTION: GI upset  . Oxycodone Hcl Nausea And Vomiting  . Paroxetine Nausea And Vomiting    Paxil   . Celecoxib Swelling    Feet swelling  . Diltiazem Swelling  . Lactose Intolerance (Gi) Other (See Comments)    Bloating and gas  . Tree Extract Other (See Comments)    "tested and told I was allergic to it; never experienced a reaction to it" (02/20/2012)   Social History   Socioeconomic History  . Marital status: Divorced    Spouse name: Not on file  . Number of children: 2  . Years of education: college  . Highest education level: Not on file  Occupational History  . Occupation: Disabled    Comment: Retired Engineer, production: RETIRED  Social Needs  . Financial resource strain: Not on file  . Food insecurity:    Worry: Not on file    Inability: Not on file  . Transportation needs:    Medical: Not on file    Non-medical: Not on file   Tobacco Use  . Smoking status: Passive Smoke Exposure - Never Smoker  . Smokeless tobacco: Never Used  . Tobacco comment: Parents  Substance and Sexual Activity  . Alcohol use: Yes    Alcohol/week: 0.0 standard drinks    Comment: 02/20/2012 "couple glasses of wine/6 months"  . Drug use: No  . Sexual activity: Never  Lifestyle  . Physical activity:    Days per week: Not on file    Minutes per session: Not on file  . Stress: Not on file  Relationships  . Social connections:    Talks on phone: Not on file  Gets together: Not on file    Attends religious service: Not on file    Active member of club or organization: Not on file    Attends meetings of clubs or organizations: Not on file    Relationship status: Not on file  . Intimate partner violence:    Fear of current or ex partner: Not on file    Emotionally abused: Not on file    Physically abused: Not on file    Forced sexual activity: Not on file  Other Topics Concern  . Not on file  Social History Narrative   Patient lives at home alone. Patient  divorced.    Patient has her BS degree.   Right handed.   Caffeine- sometimes coffee.      [facility]:   Born in [place], [place]. She worked as a Copywriter, advertising. She has no pets currently. She does have indoor plants. Previously had mold in her home that was remediated. Carpet was removed.            Review of Systems  All other systems reviewed and are negative.      Objective:   Physical Exam Vitals signs reviewed.  Constitutional:      Appearance: She is well-developed.  Cardiovascular:     Rate and Rhythm: Normal rate and regular rhythm.     Heart sounds: Normal heart sounds.  Pulmonary:     Effort: Pulmonary effort is normal. No respiratory distress.     Breath sounds: Normal breath sounds. No stridor. No wheezing.  Abdominal:     General: Bowel sounds are normal.     Palpations: Abdomen is soft.  Skin:    Findings: No erythema.            Assessment & Plan:  Anemia of unknown etiology - Plan: Iron, Vitamin B12, CBC with Differential/Platelet, COMPLETE METABOLIC PANEL WITH GFR, Fecal Globin By Immunochemistry  Polyarthralgia - Plan: Rheumatoid factor, Cyclic Citrul Peptide Antibody, IGG  Elevated LFTs  Hospital discharge follow-up  Clostridium difficile colitis  Cellulitis of forearm  Night sweats  Problem #1 cellulitis of the forearm.  This is resolved after doxycycline and the wound is healed.  No further follow-up is necessary for this.  Problem #2 C. difficile colitis.  This has seemingly resolved.  No further follow-up for this.  Problem #3 elevated liver function test.  These have resolved as well.  Repeat a CMP today.  Monitor closely.  Withhold statin at the current time in case this was statin induced liver formation.  Patient can resume Tylenol 1000 mg twice daily as needed for body aches.  We will recheck her liver function test and her cholesterol in 3 months.  Problem #4 polyarthralgias primarily in the hands and feet.  This is been presumed to be due to pseudogout.  However I have recommended a rheumatology consult.  I will also screen the patient for rheumatoid arthritis with rheumatoid factor and anti-CCP.  Her ANA has already been proven to be negative.  Problem #5 or night sweats.  This may be medication or hormone based.  I recommended that the patient check her temperature to see if she is actually running a fever at night.  If so we will need to work-up fever of unknown origin.  Problem #6 her hypothyroidism.  This normalized after she discontinued her methimazole.  Recheck TSH in 3 months.  Last checked in the hospital on December 23 was normal.

## 2018-02-24 LAB — COMPLETE METABOLIC PANEL WITH GFR
AG Ratio: 1.2 (calc) (ref 1.0–2.5)
ALT: 33 U/L — ABNORMAL HIGH (ref 6–29)
AST: 23 U/L (ref 10–35)
Albumin: 3.2 g/dL — ABNORMAL LOW (ref 3.6–5.1)
Alkaline phosphatase (APISO): 57 U/L (ref 33–130)
BUN: 15 mg/dL (ref 7–25)
CO2: 26 mmol/L (ref 20–32)
Calcium: 9.4 mg/dL (ref 8.6–10.4)
Chloride: 102 mmol/L (ref 98–110)
Creat: 0.84 mg/dL (ref 0.50–0.99)
GFR, Est African American: 82 mL/min/{1.73_m2} (ref >=60)
GFR, Est Non African American: 71 mL/min/{1.73_m2} (ref >=60)
Globulin: 2.6 g/dL (calc) (ref 1.9–3.7)
Glucose, Bld: 118 mg/dL — ABNORMAL HIGH (ref 65–99)
Potassium: 4.5 mmol/L (ref 3.5–5.3)
Sodium: 140 mmol/L (ref 135–146)
Total Bilirubin: 0.3 mg/dL (ref 0.2–1.2)
Total Protein: 5.8 g/dL — ABNORMAL LOW (ref 6.1–8.1)

## 2018-02-24 LAB — CBC WITH DIFFERENTIAL/PLATELET
Absolute Monocytes: 1263 cells/uL — ABNORMAL HIGH (ref 200–950)
Basophils Absolute: 33 cells/uL (ref 0–200)
Basophils Relative: 0.2 %
Eosinophils Absolute: 0 cells/uL — ABNORMAL LOW (ref 15–500)
Eosinophils Relative: 0 %
HCT: 28 % — ABNORMAL LOW (ref 35.0–45.0)
Hemoglobin: 9.1 g/dL — ABNORMAL LOW (ref 11.7–15.5)
Lymphs Abs: 1591 cells/uL (ref 850–3900)
MCH: 31.1 pg (ref 27.0–33.0)
MCHC: 32.5 g/dL (ref 32.0–36.0)
MCV: 95.6 fL (ref 80.0–100.0)
MPV: 10 fL (ref 7.5–12.5)
Monocytes Relative: 7.7 %
Neutro Abs: 13514 cells/uL — ABNORMAL HIGH (ref 1500–7800)
Neutrophils Relative %: 82.4 %
Platelets: 444 10*3/uL — ABNORMAL HIGH (ref 140–400)
RBC: 2.93 10*6/uL — ABNORMAL LOW (ref 3.80–5.10)
RDW: 16.3 % — ABNORMAL HIGH (ref 11.0–15.0)
Total Lymphocyte: 9.7 %
WBC: 16.4 10*3/uL — ABNORMAL HIGH (ref 3.8–10.8)

## 2018-02-24 LAB — CYCLIC CITRUL PEPTIDE ANTIBODY, IGG: Cyclic Citrullin Peptide Ab: <16 UNITS

## 2018-02-24 LAB — RHEUMATOID FACTOR: Rheumatoid fact SerPl-aCnc: <14 IU/mL (ref <14)

## 2018-02-24 LAB — VITAMIN B12: Vitamin B-12: 1038 pg/mL (ref 200–1100)

## 2018-02-24 LAB — IRON: Iron: 28 ug/dL — ABNORMAL LOW (ref 45–160)

## 2018-02-25 ENCOUNTER — Encounter: Payer: Self-pay | Admitting: Family Medicine

## 2018-02-26 ENCOUNTER — Other Ambulatory Visit: Payer: Medicare Other

## 2018-02-26 ENCOUNTER — Ambulatory Visit (INDEPENDENT_AMBULATORY_CARE_PROVIDER_SITE_OTHER): Payer: Medicare Other

## 2018-02-26 ENCOUNTER — Ambulatory Visit: Payer: Medicare Other | Admitting: Cardiology

## 2018-02-26 ENCOUNTER — Encounter: Payer: Self-pay | Admitting: Cardiology

## 2018-02-26 VITALS — BP 130/62 | HR 85 | Ht 61.0 in | Wt 123.0 lb

## 2018-02-26 DIAGNOSIS — R0600 Dyspnea, unspecified: Secondary | ICD-10-CM

## 2018-02-26 DIAGNOSIS — R0609 Other forms of dyspnea: Secondary | ICD-10-CM | POA: Diagnosis not present

## 2018-02-26 DIAGNOSIS — D72829 Elevated white blood cell count, unspecified: Secondary | ICD-10-CM

## 2018-02-26 DIAGNOSIS — M810 Age-related osteoporosis without current pathological fracture: Secondary | ICD-10-CM

## 2018-02-26 DIAGNOSIS — D509 Iron deficiency anemia, unspecified: Secondary | ICD-10-CM

## 2018-02-26 DIAGNOSIS — Z7189 Other specified counseling: Secondary | ICD-10-CM | POA: Diagnosis not present

## 2018-02-26 DIAGNOSIS — I2584 Coronary atherosclerosis due to calcified coronary lesion: Secondary | ICD-10-CM | POA: Diagnosis not present

## 2018-02-26 DIAGNOSIS — I2510 Atherosclerotic heart disease of native coronary artery without angina pectoris: Secondary | ICD-10-CM

## 2018-02-26 IMAGING — CT CT MAXILLOFACIAL W/O CM
3 of 4 series · 16 of 47 positions shown, 19 images · non-contrast
Comparison: Sinus CT 05/21/2013

CLINICAL DATA: Allergic rhinitis. Three months history of shortness
of breath and increasing cough. Left side facial pressure.

EXAM:
CT MAXILLOFACIAL WITHOUT CONTRAST
TECHNIQUE: Multidetector CT imaging of the maxillofacial structures was
performed. Multiplanar CT image reconstructions were also generated.
A small metallic BB was placed on the right temple in order to
reliably differentiate right from left.

[Series 4: facial/ orbits 2.0 h30s · axial · 0.28mm/px · z∈[+13,+125]mm · 10 of 66 slices shown, 13 images]
[im 5/66  brain]
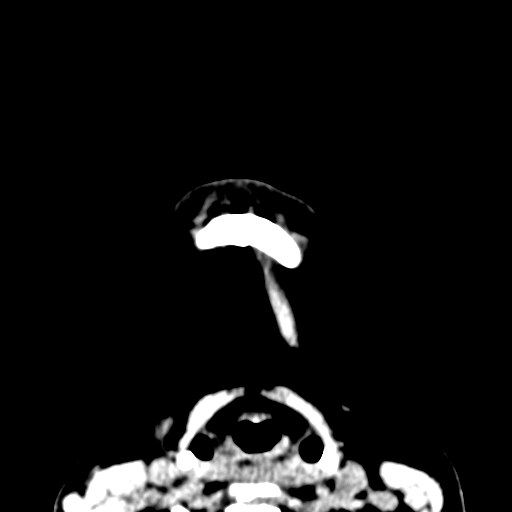
[im 5/66  bone]
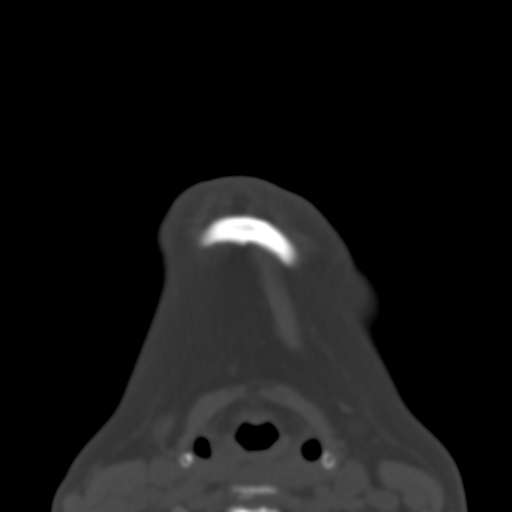
[im 10/66  bone]
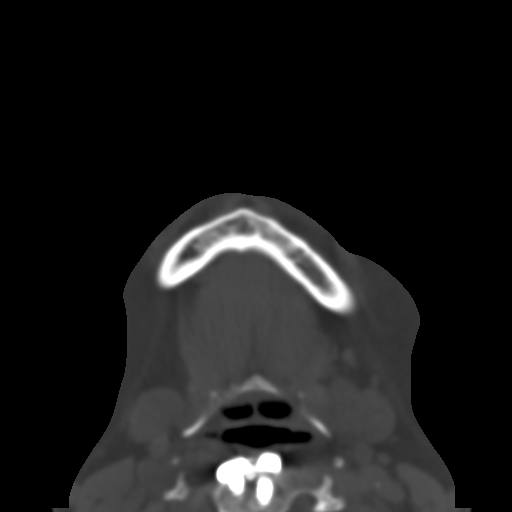
[im 19/66  bone]
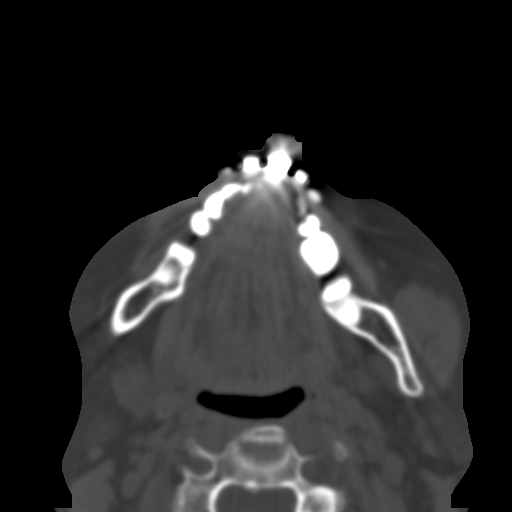
[im 24/66  bone]
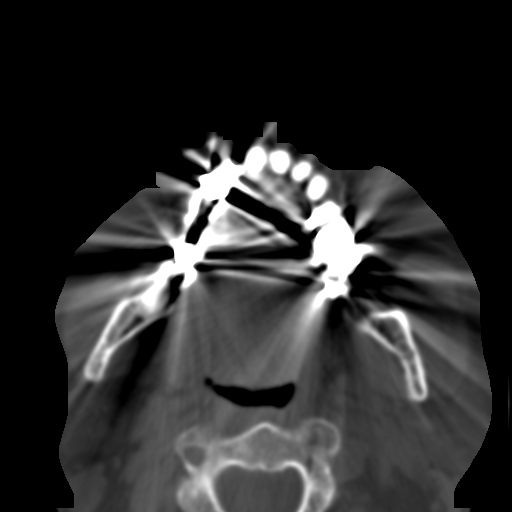
[im 28/66  brain]
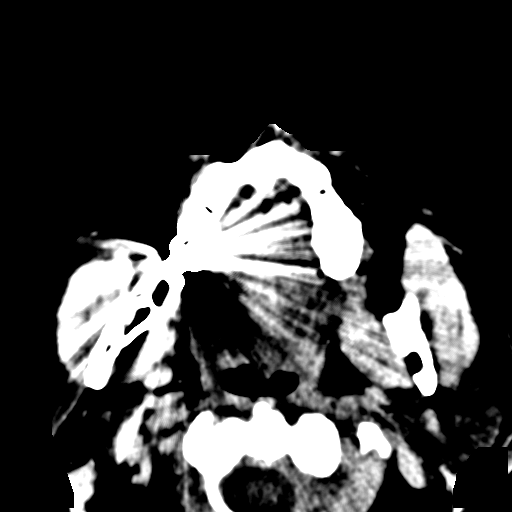
[im 28/66  bone]
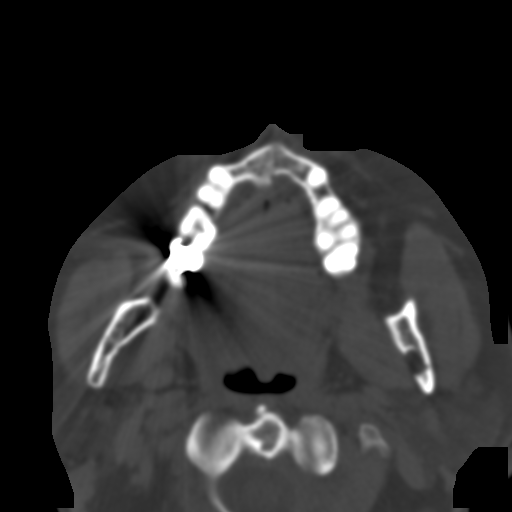
[im 38/66  bone]
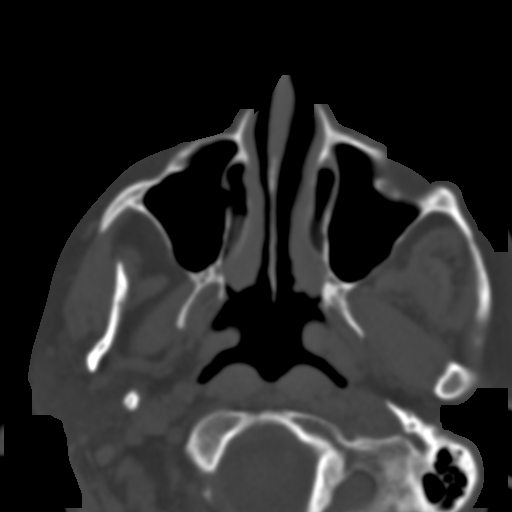
[im 42/66  bone]
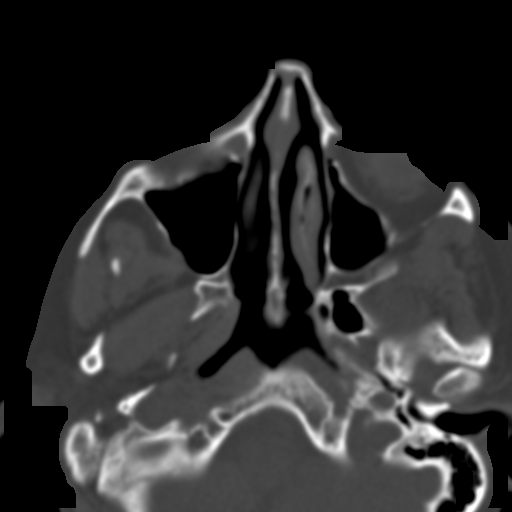
[im 47/66  bone]
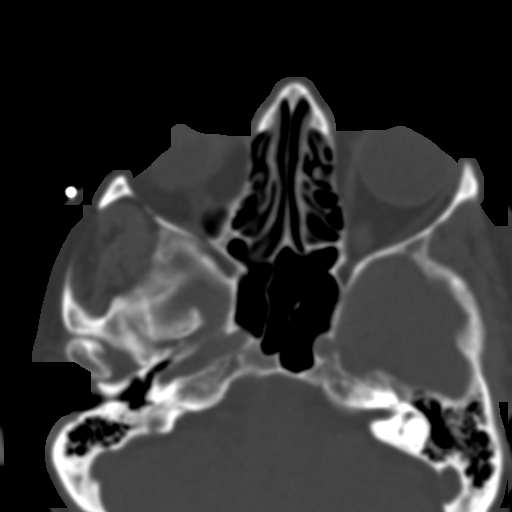
[im 56/66  brain]
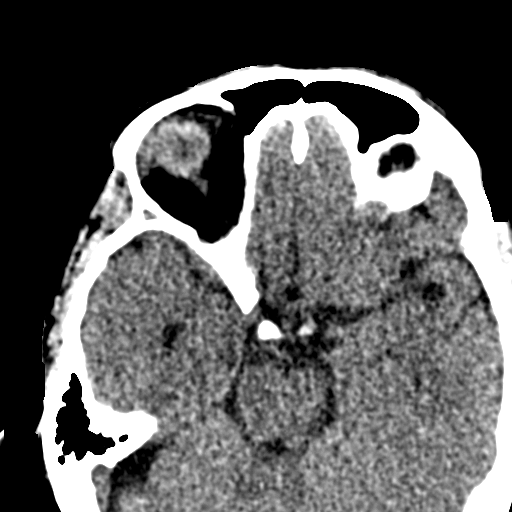
[im 56/66  bone]
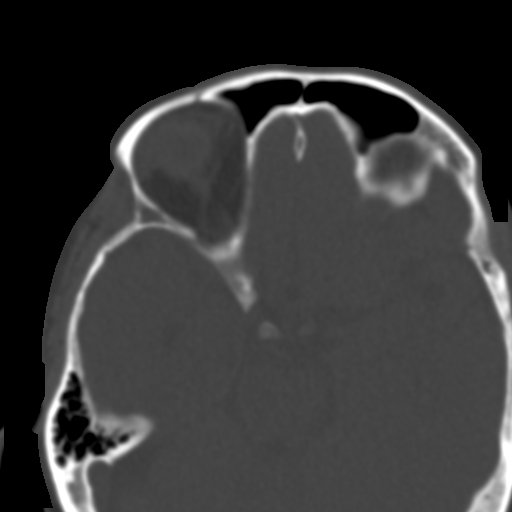
[im 61/66  bone]
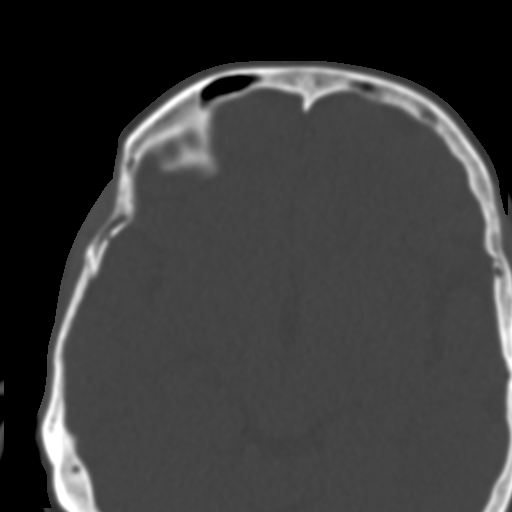

[Series 8: coronal soft tissue · coronal · 0.26mm/px · 3 of 79 slices shown]
[im 27/79  bone]
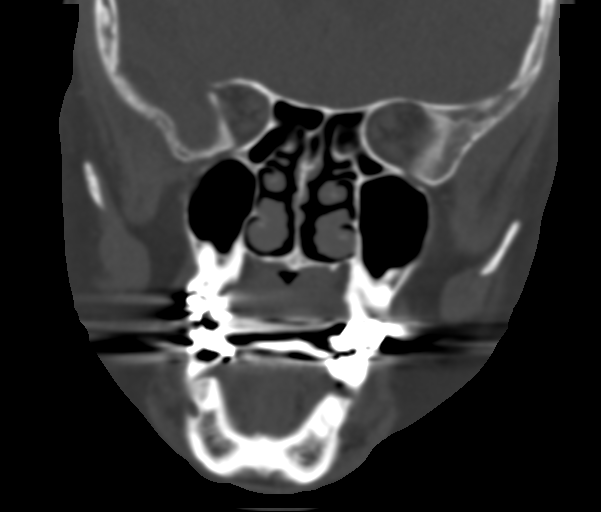
[im 35/79  bone]
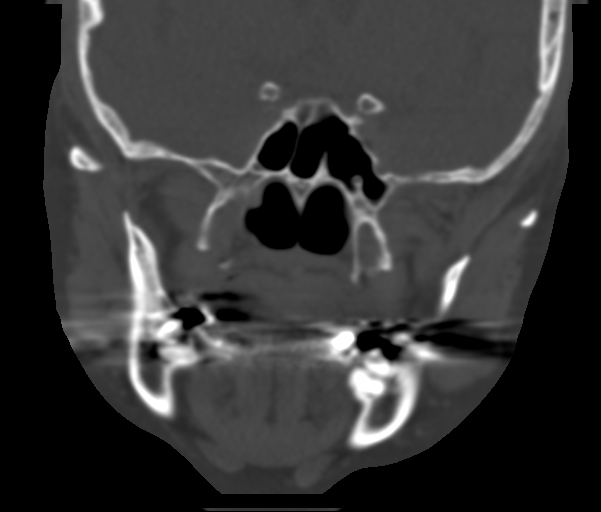
[im 44/79  bone]
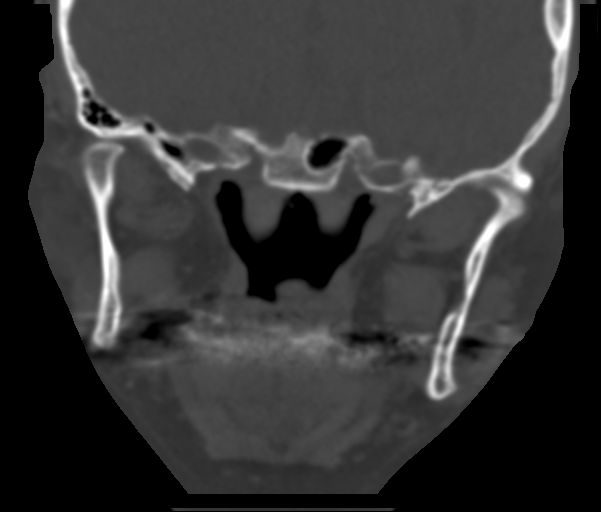

[Series 9: sagittal soft tissue · sagittal · 0.26mm/px · 3 of 72 slices shown]
[im 24/72  bone]
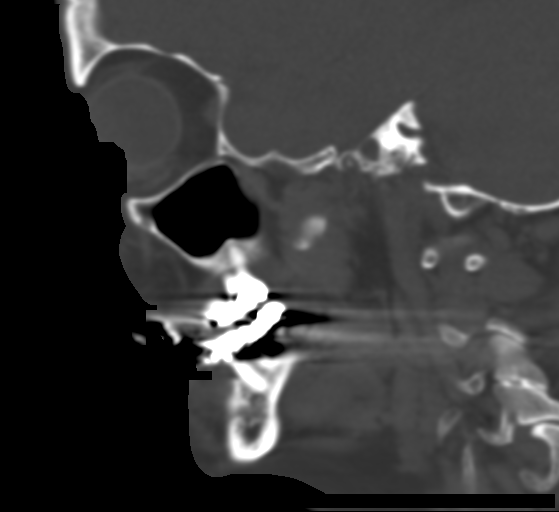
[im 36/72  bone]
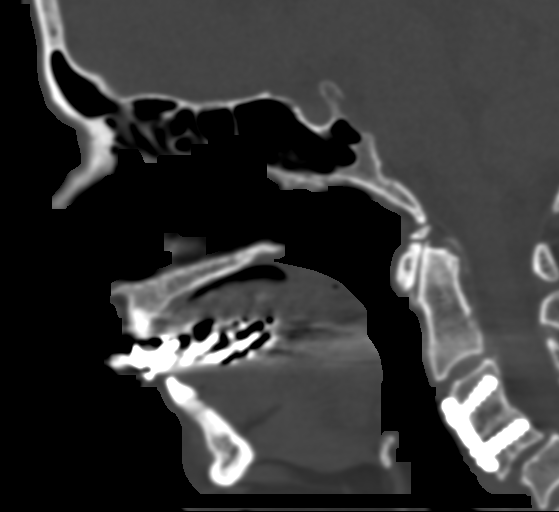
[im 48/72  bone]
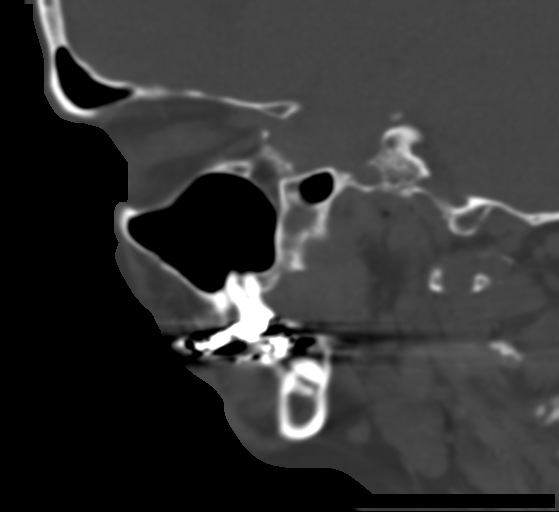

[16 of 47 positions shown; findings below may reference images not displayed]

FINDINGS: Paranasal sinuses are clear. No mucosal thickening or air-fluid
level within the paranasal sinuses. Mastoid air cells are clear.
Bony structures are unremarkable. Orbital soft tissues unremarkable.
IMPRESSION: No evidence of sinusitis.

## 2018-02-26 MED ORDER — DENOSUMAB 60 MG/ML ~~LOC~~ SOSY
60.0000 mg | PREFILLED_SYRINGE | Freq: Once | SUBCUTANEOUS | Status: AC
  Administered 2018-02-26: 60 mg via SUBCUTANEOUS

## 2018-02-26 MED FILL — GERHARDT'S BUTT CREAM BULK: 60 days supply | Qty: 60 | Fill #0

## 2018-02-26 NOTE — Progress Notes (Signed)
Patient came in today to receive Prolia injection. Prolia was given in the right arm. Patient tolerated well. Advised to return in 6 months for next dose.

## 2018-02-26 NOTE — Patient Instructions (Signed)
Medication Instructions:  Your Physician recommend you continue on your current medication as directed.    If you need a refill on your cardiac medications before your next appointment, please call your pharmacy.   Lab work: None  Testing/Procedures: None  Follow-Up: At [facility], you and your health needs are our priority.  As part of our continuing mission to provide you with exceptional heart care, we have created designated Provider Care Teams.  These Care Teams include your primary Cardiologist (physician) and Advanced Practice Providers (APPs -  Physician Assistants and Nurse Practitioners) who all work together to provide you with the care you need, when you need it. You will need a follow up appointment in 1 years.  Please call our office 2 months in advance to schedule this appointment.  You may see Bridgette Christopher, MD or one of the following Advanced Practice Providers on your designated Care Team:   Rhonda Barrett, PA-C . Kathryn Lawrence, DNP, ANP     

## 2018-02-26 NOTE — Progress Notes (Signed)
Cardiology Office Note:    Date:  02/26/2018   ID:  Ebony Scott, DOB November 28, 1950, MRN [id number]  PCP:  [name], MD  Cardiologist:  Buford Dresser, MD PhD  Referring MD: [name], MD   CC: follow up  History of Present Illness:    Ebony Scott is a 68 y.o. female with a hx of asthma and other medical conditions listed below who is followed for incidental coronary artery calcification seen on CT scan. Initial consult was 10/15/17.  Cardiac history: had CT chest 07/2017 which noted diffuse calcification, including aorta, aortic branch vessels, and coronary arteries. Had a normal Lexiscan in the past. No chest pain, main longstanding symptom is shortness of breath, followed by pulmonary. No history of clinical ASCVD events.   Followup today: Recently discharged from the hospital. Had upper extremity cellulitis, presented with fevers to 103. Also of note, LFTs elevated (peak AST 379, ALT 305) early December. Unclear etiology, but statin has been on hold. Wearing bilateral foot braces, seeing orthopedics tomorrow to see if they can come off. She has her caretaker jennifer here with her today. Back on anticoagulation for blood clots, plan is to continue indefinitely.   Past Medical History:  Diagnosis Date  . Allergic rhinitis   . Allergy    SEASONAL  . Anemia   . Anxiety    pt denies  . Arthritis    "mostly the hands" (02/20/2012)  . Asthma   . Breast cancer ([place]) 1998   in remission  . Cataract    REMOVED  . Chronic lower back pain   . Chronic neck pain   . Complication of anesthesia    "had hard time waking up from it several times" (02/20/2012)  . Depression    "some; don't take anything for it" (02/20/2012)  . Diverticulosis   . DVT (deep venous thrombosis) ([place])   . Exertional dyspnea   . Fibromyalgia 11/2011  . GERD (gastroesophageal reflux disease)   . Graves disease   . Headache(784.0)    "related to allergies; more at different times during the  year" (02/20/2012)  . Hemorrhoids   . Hiatal hernia    back and neck  . Hx of adenomatous colonic polyps 04/12/2016  . Hypercholesteremia    good cholesterol is high  . Hypothyroidism   . IBS (irritable bowel syndrome)   . Moderate persistent asthma    -FeV1 72% 2011, -IgE 102 2011, CT sinus Neg 2011  . Osteoporosis    on reclast yearly  . Pneumonia 04/2011; ~ 11/2011   "double; single" (02/20/2012)    Past Surgical History:  Procedure Laterality Date  . ANTERIOR AND POSTERIOR REPAIR  1990's  . APPENDECTOMY    . BREAST LUMPECTOMY  1998   left  . CARPOMETACARPEL ([place]) FUSION OF THUMB WITH AUTOGRAFT FROM RADIUS  ~ 2009   "both thumbs" (02/20/2012)  . CATARACT EXTRACTION W/ INTRAOCULAR LENS  IMPLANT, BILATERAL  2012  . CERVICAL DISCECTOMY  10/2001   C5-C6  . CERVICAL FUSION  2003   C3-C4  . CHOLECYSTECTOMY    . COLONOSCOPY    . DEBRIDEMENT TENNIS ELBOW  ?1970's   right  . ESOPHAGOGASTRODUODENOSCOPY    . HYSTERECTOMY    . KNEE ARTHROPLASTY  ?1990's   "?right; w/cartilage repair" (02/20/2012)  . NASAL SEPTUM SURGERY  1980's  . POSTERIOR CERVICAL FUSION/FORAMINOTOMY  2004   "failed initial fusion; rewired  anterior neck" (02/20/2012)  . TONSILLECTOMY  ~ 1953  .  VESICOVAGINAL FISTULA CLOSURE W/ TAH  1988  . VIDEO BRONCHOSCOPY Bilateral 08/23/2016   Procedure: VIDEO BRONCHOSCOPY WITH FLUORO;  Surgeon: Javier Glazier, MD;  Location: [facility];  Service: Cardiopulmonary;  Laterality: Bilateral;    Current Medications: Current Outpatient Medications on File Prior to Visit  Medication Sig  . albuterol (PROAIR HFA) 108 (90 Base) MCG/ACT inhaler Inhale 2 puffs into the lungs every 4 (four) hours as needed for wheezing or shortness of breath.  . Alpha-D-Galactosidase (BEANO PO) Take 1 capsule by mouth 3 (three) times daily as needed (gas/ bloating).   . Alpha-Lipoic Acid 600 MG CAPS Take 600 mg by mouth daily.   Marland Kitchen alum & mag hydroxide-simeth (MAALOX/MYLANTA) 200-200-20 MG/5ML  suspension Take 15 mLs by mouth every 4 (four) hours as needed for indigestion or heartburn.  Marland Kitchen azelastine (ASTELIN) 0.1 % nasal spray USE 2 SPRAYS IN EACH NOSTRIL DAILY AS DIRECTED  . CALCIUM-MAGNESIUM PO Take 1 tablet by mouth at bedtime.  . cetirizine (ZYRTEC) 10 MG tablet Take 5 mg by mouth every evening.   . cholecalciferol (VITAMIN D) 25 MCG (1000 UT) tablet Take 1,000 Units by mouth daily.   . cyproheptadine (PERIACTIN) 4 MG tablet TAKE 2 TABLETS BY MOUTH EVERY EVENING (Patient taking differently: TAKE 1 TABLET BY MOUTH EVERY EVENING)  . denosumab (PROLIA) 60 MG/ML SOLN injection Inject 60 mg into the skin every 6 (six) months. Administer in upper arm, thigh, or abdomen  . dexlansoprazole (DEXILANT) 60 MG capsule Take 1 capsule (60 mg total) by mouth daily.  . diclofenac sodium (VOLTAREN) 1 % GEL Apply 2 g topically 2 (two) times daily.  Marland Kitchen EPINEPHrine (EPIPEN 2-PAK) 0.3 mg/0.3 mL IJ SOAJ injection Inject 0.3 mLs (0.3 mg total) into the muscle Once PRN for up to 1 dose.  . gabapentin (NEURONTIN) 600 MG tablet Take 600 mg by mouth at bedtime.  Marland Kitchen Hydrocortisone (GERHARDT'S BUTT CREAM) CREA Apply 1 application topically 4 (four) times daily.  . Lactase (LACTAID FAST ACT) 9000 units TABS Take 18,000 Units by mouth 4 (four) times daily as needed (dairy consumption).   Marland Kitchen lidocaine (LIDODERM) 5 % Place 1 patch onto the skin daily as needed (pain). Remove & Discard patch within 12 hours or as directed by MD  . mirtazapine (REMERON SOL-TAB) 30 MG disintegrating tablet DISSOLVE 1 TABLET IN MOUTH EVERY NIGHT AT BEDTIME  . mometasone (NASONEX) 50 MCG/ACT nasal spray Place 2 sprays into the nose daily.  . mupirocin ointment (BACTROBAN) 2 % Place 1 application into the nose at bedtime. To prevent nose bleeds  . nystatin (MYCOSTATIN) 100000 UNIT/ML suspension Use as directed 5 mLs (500,000 Units total) in the mouth or throat at bedtime. Swish and spit  . predniSONE (DELTASONE) 10 MG tablet Take 1 tablet (10  mg total) by mouth daily for 14 days.  Marland Kitchen PRESCRIPTION MEDICATION Place 2 puffs into both ears once a week. Place capsule in dispenser and instill 2 puffs in each ear once a week after washing hair. CS (X) AHCL    CHLORAMP/SULFU/AMPHO B/ H DROC 100-100-5 mg capsule - compounded at [place]  . Probiotic Product (ALIGN) 4 MG CAPS Take 4 mg by mouth every evening.   Marland Kitchen Propylene Glycol-Glycerin (SOOTHE OP) Place 1 drop into both eyes 2 (two) times daily as needed (dry eyes).   . ranitidine (ZANTAC) 300 MG tablet Take 1 tablet (300 mg total) by mouth every evening.  Marland Kitchen Respiratory Therapy Supplies (FLUTTER) DEVI Use as directed  . rivaroxaban (XARELTO)  20 MG TABS tablet Take 1 tablet (20 mg total) by mouth daily with supper.  . saccharomyces boulardii (FLORASTOR) 250 MG capsule Take 1 capsule (250 mg total) by mouth 2 (two) times daily.  . silver sulfADIAZINE (SILVADENE) 1 % cream Apply 1 application topically daily.  Marland Kitchen Spacer/Aero-Holding Chambers (AEROCHAMBER MV) inhaler Use as instructed  . Tiotropium Bromide Monohydrate (SPIRIVA RESPIMAT) 2.5 MCG/ACT AERS Inhale 2 puffs into the lungs daily.   No current facility-administered medications on file prior to visit.      Allergies:   Dust mite extract; Molds & smuts; Morphine sulfate; Other; Penicillins; Reclast [zoledronic acid]; Rofecoxib; Shrimp flavor; Tetracycline hcl; Xolair [omalizumab]; Dilaudid [hydromorphone hcl]; Hydrocodone-acetaminophen; Levofloxacin; Oxycodone hcl; Paroxetine; Celecoxib; Diltiazem; Lactose intolerance (gi); and Tree extract   Social History   Socioeconomic History  . Marital status: Divorced    Spouse name: Not on file  . Number of children: 2  . Years of education: college  . Highest education level: Not on file  Occupational History  . Occupation: Disabled    Comment: Retired Engineer, production: RETIRED  Social Needs  . Financial resource strain: Not on file  . Food insecurity:     Worry: Not on file    Inability: Not on file  . Transportation needs:    Medical: Not on file    Non-medical: Not on file  Tobacco Use  . Smoking status: Passive Smoke Exposure - Never Smoker  . Smokeless tobacco: Never Used  . Tobacco comment: Parents  Substance and Sexual Activity  . Alcohol use: Yes    Alcohol/week: 0.0 standard drinks    Comment: 02/20/2012 "couple glasses of wine/6 months"  . Drug use: No  . Sexual activity: Never  Lifestyle  . Physical activity:    Days per week: Not on file    Minutes per session: Not on file  . Stress: Not on file  Relationships  . Social connections:    Talks on phone: Not on file    Gets together: Not on file    Attends religious service: Not on file    Active member of club or organization: Not on file    Attends meetings of clubs or organizations: Not on file    Relationship status: Not on file  Other Topics Concern  . Not on file  Social History Narrative   Patient lives at home alone. Patient  divorced.    Patient has her BS degree.   Right handed.   Caffeine- sometimes coffee.      [facility]:   Born in [place], [place]. She worked as a Copywriter, advertising. She has no pets currently. She does have indoor plants. Previously had mold in her home that was remediated. Carpet was removed.            Family History: The patient's family history includes Allergies in her father and mother; Arthritis in her father and mother; Colitis in her daughter; Colon cancer in an other family member; Diabetes in her maternal grandfather and mother; Heart disease in her father and mother; Lung cancer in her mother; Stroke in her father. Father had 4V CABG and valve replacement at age 82; had a complication with ruptured aorta after surgery and passed away. Mother had lung cancer and diabetes. No other significant heart disease or risks.   ROS:   Please see the history of present illness.  Additional pertinent ROS: Review of Systems    Constitutional: Negative for chills,  diaphoresis and fever.  HENT: Positive for hearing loss. Negative for ear pain.   Eyes: Positive for blurred vision. Negative for pain.  Respiratory: Positive for cough and shortness of breath.   Cardiovascular: Negative for chest pain, palpitations, orthopnea, claudication, leg swelling and PND.  Gastrointestinal: Negative for abdominal pain, blood in stool and melena.  Genitourinary: Negative for dysuria and hematuria.  Musculoskeletal: Negative for falls and myalgias.  Skin: Negative for rash.  Neurological: Negative for focal weakness and loss of consciousness.  Endo/Heme/Allergies: Positive for environmental allergies. Does not bruise/bleed easily.    EKGs/Labs/Other Studies Reviewed:    The following studies were reviewed today: Echo 08/05/17 Study Conclusions  - Left ventricle: The cavity size was normal. There was mild focal   basal hypertrophy of the septum. Systolic function was vigorous.   The estimated ejection fraction was in the range of 65% to 70%.   Wall motion was normal; there were no regional wall motion   abnormalities. Doppler parameters are consistent with abnormal   left ventricular relaxation (grade 1 diastolic dysfunction).  Impressions:  - Definity used; vigorous LV systolic function; mild diastolic   dysfunction.  CT chest high resolution 6.17.19 FINDINGS: Cardiovascular: Heart size is normal. There is no significant pericardial fluid, thickening or pericardial calcification. There is aortic atherosclerosis, as well as atherosclerosis of the great vessels of the mediastinum and the coronary arteries, including calcified atherosclerotic plaque in the left main, left anterior descending left circumflex coronary arteries.  IMPRESSION: 1. No findings to suggest interstitial lung disease. 2. Mild air trapping indicative of mild small airways disease. 3. Aortic atherosclerosis, in addition to left main and 2  vessel coronary artery disease. Please note that although the presence of coronary artery calcium documents the presence of coronary artery disease, the severity of this disease and any potential stenosis cannot be assessed on this non-gated CT examination. Assessment for potential risk factor modification, dietary therapy or pharmacologic therapy may be warranted, if clinically indicated. 4. Additional incidental findings, as above.  Stress 2011:  Stress echo 11/2009 Study Conclusions - Stress ECG conclusions: There were no stress arrhythmias or conduction abnormalities. The stress ECG was normal. - Staged echo: There was no echocardiographic evidence for stress-induced ischemia. Impressions: - Nondiagnostic stress echocardiogram with nochest pain, no ST changes; patient did not achieve target heart rate; no stress-induced wall motion abnormalities.  Had [place] 12/2009 due to nondiagnostic stress echo: Stress Procedure  The patient received IV Lexiscan 0.4 mg over 15-seconds with concurrent low level exercise and then Technetium 54m Tetrofosmin was injected at 30-seconds while the patient continued walking one more minute.  There were no significant changes with Lexiscan.  Quantitative spect images were obtained after a 45 minute delay.  QPS Raw Data Images:  Normal; no motion artifact; normal heart/lung ratio. Stress Images:  Normal homogeneous uptake in all areas of the myocardium. Rest Images:  Normal homogeneous uptake in all areas of the myocardium. Subtraction (SDS):  No evidence of ischemia. Transient Ischemic Dilatation:  1.15  (Normal <1.22)  Lung/Heart Ratio:  .27  (Normal <0.45)  Quantitative Gated Spect Images QGS EDV:  52 ml QGS ESV:  13 ml QGS EF:  75 % QGS cine images:  Normal motion  Findings Normal nuclear study  EKG:  EKG is personally reviewed today.  The ekg ordered 02/10/18 demonstrates normal sinus rhythm  Recent Labs: 12/21/2017: B Natriuretic  Peptide 117.0 02/10/2018: Magnesium 1.4; TSH 2.688 02/21/2018: ALT 33; BUN 15; Creat 0.84; Hemoglobin 9.1; Platelets  444; Potassium 4.5; Sodium 140  Recent Lipid Panel    Component Value Date/Time   CHOL 158 12/02/2017 0809   TRIG 134 12/02/2017 0809   HDL 64 12/02/2017 0809   CHOLHDL 2.5 12/02/2017 0809   VLDL 27 08/20/2016 1103   LDLCALC 72 12/02/2017 0809    Physical Exam:    VS:  BP 130/62   Pulse 85   Ht 5\' 1"  (1.549 m)   Wt 123 lb (55.8 kg)   SpO2 96%   BMI 23.24 kg/m     Wt Readings from Last 3 Encounters:  02/26/18 123 lb (55.8 kg)  02/21/18 122 lb (55.3 kg)  02/12/18 121 lb 11.1 oz (55.2 kg)     GEN: Well nourished, well developed in no acute distress. HEENT: Normal NECK: No JVD; No carotid bruits LYMPHATICS: No lymphadenopathy CARDIAC: regular rhythm, normal S1 and S2, no murmurs, rubs, gallops. Radial pulses 2+ bilaterally. RESPIRATORY:  Clear to auscultation without rales, wheezing or rhonchi  ABDOMEN: Soft, non-tender, non-distended MUSCULOSKELETAL:  No edema above bilateral foot braces; No deformity  SKIN: Warm and dry NEUROLOGIC:  Alert and oriented x 3 PSYCHIATRIC:  Normal affect   ASSESSMENT:    1. Coronary artery disease due to calcified coronary lesion   2. Dyspnea on exertion   3. Counseling on health promotion and disease prevention    PLAN:    1. Coronary calcium seen on CT scan: this is a CAD equivalent Coronary calcium seen on CT scan. Patient without symptoms of chest pain; shortness of breath (below) is multfactorial. She has had prior negative nuclear stress test, and her recent echo does not show any wall motion abnormalities.  Secondary prevention: -recommend heart healthy/Mediterranean diet, with whole grains, fruits, vegetable, fish, lean meats, nuts, and olive oil. Limit salt. -recommend moderate walking, 3-5 times/week for 30-50 minutes each session. Aim for at least 150 minutes.week. Goal should be pace of 3 miles/hours, or  walking 1.5 miles in 30 minutes -recommend avoidance of tobacco products. Avoid excess alcohol. -Additional risk factor control:  -Diabetes: no history  -Lipids: LDL goal <70, however with elevations in her LFTs, agree with holding statin for several months while she recovers, then retrying.  -Blood pressure control: at goal  -Weight: BMI 23 -no aspirin given plans for long term anticoagulation  2. Shortness of breath: stable to improved, following with vocal cord specialist for this. We discussed at length at the initial visit the options for investigating this further. As she has been through a great deal recently, she understandably wishes to recover and see how things go. If her symptoms worsen, I would be happy to see her back sooner to discuss a lexiscan stress test. We reviewed all the options at length at the initial visit, and despite the risk for the lexiscan to exacerbate her breathing issues, she would prefer that test if necessary.   Plan for follow up: 1 year or sooner PRN  TIME SPENT WITH PATIENT: 30 minutes of direct patient care. More than 50% of that time was spent on coordination of care and counseling regarding CAD management.  Buford Dresser, MD, PhD   [facility]   Medication Adjustments/Labs and Tests Ordered: Current medicines are reviewed at length with the patient today.  Concerns regarding medicines are outlined above.  No orders of the defined types were placed in this encounter.  No orders of the defined types were placed in this encounter.   Patient Instructions  Medication Instructions:  Your Physician  recommend you continue on your current medication as directed.   If you need a refill on your cardiac medications before your next appointment, please call your pharmacy.   Lab work: None  Testing/Procedures: None  Follow-Up: At [facility], you and your health needs are our priority.  As part of our continuing mission to  provide you with exceptional heart care, we have created designated Provider Care Teams.  These Care Teams include your primary Cardiologist (physician) and Advanced Practice Providers (APPs -  Physician Assistants and Nurse Practitioners) who all work together to provide you with the care you need, when you need it. You will need a follow up appointment in 1 years.  Please call our office 2 months in advance to schedule this appointment.  You may see Buford Dresser, MD or one of the following Advanced Practice Providers on your designated Care Team:   Rosaria Ferries, PA-C . Jory Sims, DNP, ANP        Signed, Buford Dresser, MD PhD 02/26/2018 10:00 PM    [place]

## 2018-02-27 ENCOUNTER — Ambulatory Visit (INDEPENDENT_AMBULATORY_CARE_PROVIDER_SITE_OTHER): Payer: Self-pay

## 2018-02-27 ENCOUNTER — Ambulatory Visit (INDEPENDENT_AMBULATORY_CARE_PROVIDER_SITE_OTHER): Payer: Medicare Other | Admitting: Orthopedic Surgery

## 2018-02-27 ENCOUNTER — Encounter (INDEPENDENT_AMBULATORY_CARE_PROVIDER_SITE_OTHER): Payer: Self-pay | Admitting: Orthopedic Surgery

## 2018-02-27 VITALS — Ht 61.0 in | Wt 123.0 lb

## 2018-02-27 DIAGNOSIS — S92352D Displaced fracture of fifth metatarsal bone, left foot, subsequent encounter for fracture with routine healing: Secondary | ICD-10-CM

## 2018-02-27 DIAGNOSIS — S92351A Displaced fracture of fifth metatarsal bone, right foot, initial encounter for closed fracture: Secondary | ICD-10-CM

## 2018-02-27 DIAGNOSIS — M79672 Pain in left foot: Secondary | ICD-10-CM

## 2018-02-27 DIAGNOSIS — M79671 Pain in right foot: Secondary | ICD-10-CM

## 2018-02-27 LAB — FECAL GLOBIN BY IMMUNOCHEMISTRY
FECAL GLOBIN RESULT:: NOT DETECTED
MICRO NUMBER:: 27922
SPECIMEN QUALITY:: ADEQUATE

## 2018-03-02 ENCOUNTER — Encounter (INDEPENDENT_AMBULATORY_CARE_PROVIDER_SITE_OTHER): Payer: Self-pay | Admitting: Orthopedic Surgery

## 2018-03-02 NOTE — Progress Notes (Signed)
Office Visit Note   Patient: Ebony Scott           Date of Birth: Apr 27, 1950           MRN: [id number] Visit Date: 02/27/2018              Requested by: [name], MD [address] [place], [place] [address] PCP: [name], MD  Chief Complaint  Patient presents with  . Left Foot - Pain  . Right Foot - Pain      HPI: Patient is a 68 year old woman who states she fell down stairs and twisted her ankle in November.  She has had fractures to the base of the fifth metatarsals bilaterally and has been weightbearing as tolerated with a walker and fracture boots.  Patient denies any pain she is on prednisone 2 mg a day and is weaning off.  Assessment & Plan: Visit Diagnoses:  1. Pain in both feet   2. Fracture of base of fifth metatarsal bone with routine healing, left   3. Closed fracture of fifth metatarsal bone of right foot, initial encounter     Plan: Recommended patient advance to a stiff soled sneaker work on Achilles stretching follow-up as needed  Follow-Up Instructions: Return if symptoms worsen or fail to improve.   Ortho Exam  Patient is alert, oriented, no adenopathy, well-dressed, normal affect, normal respiratory effort. Examination the fracture sites are nontender to palpation she does have some heel cord tightness bilaterally there is no skin breakdown no abrasions.  Patient was given instructions and demonstrated Achilles stretching.  Imaging: No results found. No images are attached to the encounter.  Labs: Lab Results  Component Value Date   ESRSEDRATE 121 (H) 01/22/2018   ESRSEDRATE 138 (H) 01/16/2018   ESRSEDRATE 130 (H) 02/22/2012   CRP 6.5 (H) 01/22/2018   CRP 15.8 (H) 01/16/2018   CRP 23.8 (H) 02/22/2012   LABURIC 3.7 12/19/2017   LABURIC 3.5 02/21/2012   REPTSTATUS 02/15/2018 FINAL 02/10/2018   REPTSTATUS 02/15/2018 FINAL 02/10/2018   GRAMSTAIN  01/18/2018    RARE WBC PRESENT, PREDOMINANTLY PMN NO ORGANISMS SEEN    CULT  02/10/2018    NO GROWTH 5 DAYS Performed at [facility], [place] [address], [place], [place] [address]    CULT  02/10/2018    NO GROWTH 5 DAYS Performed at [facility] [address] [place],  [address]    LABORGA Normal Oropharyngeal [place] 08/30/2016     Lab Results  Component Value Date   ALBUMIN 2.1 (L) 02/12/2018   ALBUMIN 2.2 (L) 02/11/2018   ALBUMIN 2.4 (L) 02/10/2018   LABURIC 3.7 12/19/2017   LABURIC 3.5 02/21/2012    Body mass index is 23.24 kg/m.  Orders:  Orders Placed This Encounter  Procedures  . XR Foot 2 Views Right  . XR Foot 2 Views Left   No orders of the defined types were placed in this encounter.    Procedures: No procedures performed  Clinical Data: No additional findings.  ROS:  All other systems negative, except as noted in the HPI. Review of Systems  Objective: Vital Signs: Ht 5\' 1"  (1.549 m)   Wt 123 lb (55.8 kg)   BMI 23.24 kg/m   Specialty Comments:  No specialty comments available.  PMFS History: Patient Active Problem List   Diagnosis Date Noted  . Cellulitis of forearm   . Fever 02/10/2018  . SIRS (systemic inflammatory response  syndrome) ([place]) 02/10/2018  . Nausea and vomiting 02/10/2018  . Diarrhea 02/10/2018  . Abdominal pain 02/10/2018  . Fracture of base of fifth metatarsal bone with routine healing, left   . Closed fracture of fifth metatarsal bone of right foot, initial encounter 01/15/2018  . Clostridium difficile diarrhea 01/14/2018  . Clostridium difficile colitis 01/14/2018  . DVT (deep venous thrombosis) ([place]) 01/14/2018  . Hypothyroidism 01/14/2018  . Depression 01/14/2018  . Debility   . Acute left ankle pain   . Malnutrition of moderate degree 12/17/2017  . Knee pain   . History of breast cancer   . Chronic pain syndrome   . Fibromyalgia   . Graves disease   . Leukocytosis   . Sepsis due to undetermined organism ([place]) 12/13/2017  . Rhabdomyolysis 12/13/2017  . Acute  deep vein thrombosis (DVT) of right lower extremity ([place]) 12/13/2017  . Multiple lung nodules on CT   . CKD (chronic kidney disease), stage III ([place]) 04/15/2016  . Hx of adenomatous colonic polyps 04/12/2016  . Cough 02/09/2016  . Opacity of lung on imaging study 11/03/2015  . Severe persistent asthma 02/08/2015  . Facial pain syndrome 02/08/2015  . Insomnia 02/08/2015  . Other allergic rhinitis 02/08/2015  . LPRD (laryngopharyngeal reflux disease) 02/08/2015  . Chronic diastolic CHF (congestive heart failure) ([place]) 02/08/2015  . Nausea without vomiting 04/08/2014  . Benign paroxysmal positional vertigo 12/03/2012  . Dizziness and giddiness 10/29/2012  . Pseudogout 02/24/2012  . Pneumonia 05/31/2011  . Irritable bowel syndrome 01/02/2010  . Hyperlipidemia 12/23/2009  . Hyperthyroidism 11/12/2006  . Seasonal and perennial allergic rhinitis 11/12/2006  . GERD 11/12/2006  . NECK PAIN, CHRONIC 11/12/2006  . OSTEOPOROSIS 11/12/2006  . BREAST CANCER, HX OF 11/12/2006   Past Medical History:  Diagnosis Date  . Allergic rhinitis   . Allergy    SEASONAL  . Anemia   . Anxiety    pt denies  . Arthritis    "mostly the hands" (02/20/2012)  . Asthma   . Breast cancer ([place]) 1998   in remission  . Cataract    REMOVED  . Chronic lower back pain   . Chronic neck pain   . Complication of anesthesia    "had hard time waking up from it several times" (02/20/2012)  . Depression    "some; don't take anything for it" (02/20/2012)  . Diverticulosis   . DVT (deep venous thrombosis) ([place])   . Exertional dyspnea   . Fibromyalgia 11/2011  . GERD (gastroesophageal reflux disease)   . Graves disease   . Headache(784.0)    "related to allergies; more at different times during the year" (02/20/2012)  . Hemorrhoids   . Hiatal hernia    back and neck  . Hx of adenomatous colonic polyps 04/12/2016  . Hypercholesteremia    good cholesterol is high  . Hypothyroidism   . IBS (irritable bowel syndrome)    . Moderate persistent asthma    -FeV1 72% 2011, -IgE 102 2011, CT sinus Neg 2011  . Osteoporosis    on reclast yearly  . Pneumonia 04/2011; ~ 11/2011   "double; single" (02/20/2012)    Family History  Problem Relation Age of Onset  . Allergies Mother   . Heart disease Mother   . Arthritis Mother   . Lung cancer Mother   . Diabetes Mother   . Allergies Father   . Heart disease Father   . Arthritis Father   . Stroke Father   . Colon cancer  Other        Maternal half aunt/Maternal half uncle  . Colitis Daughter   . Diabetes Maternal Grandfather     Past Surgical History:  Procedure Laterality Date  . ANTERIOR AND POSTERIOR REPAIR  1990's  . APPENDECTOMY    . BREAST LUMPECTOMY  1998   left  . CARPOMETACARPEL ([place]) FUSION OF THUMB WITH AUTOGRAFT FROM RADIUS  ~ 2009   "both thumbs" (02/20/2012)  . CATARACT EXTRACTION W/ INTRAOCULAR LENS  IMPLANT, BILATERAL  2012  . CERVICAL DISCECTOMY  10/2001   C5-C6  . CERVICAL FUSION  2003   C3-C4  . CHOLECYSTECTOMY    . COLONOSCOPY    . DEBRIDEMENT TENNIS ELBOW  ?1970's   right  . ESOPHAGOGASTRODUODENOSCOPY    . HYSTERECTOMY    . KNEE ARTHROPLASTY  ?1990's   "?right; w/cartilage repair" (02/20/2012)  . NASAL SEPTUM SURGERY  1980's  . POSTERIOR CERVICAL FUSION/FORAMINOTOMY  2004   "failed initial fusion; rewired  anterior neck" (02/20/2012)  . TONSILLECTOMY  ~ 1953  . VESICOVAGINAL FISTULA CLOSURE W/ TAH  1988  . VIDEO BRONCHOSCOPY Bilateral 08/23/2016   Procedure: VIDEO BRONCHOSCOPY WITH FLUORO;  Surgeon: Javier Glazier, MD;  Location: [facility];  Service: Cardiopulmonary;  Laterality: Bilateral;   Social History   Occupational History  . Occupation: Disabled    Comment: Retired Engineer, production: RETIRED  Tobacco Use  . Smoking status: Passive Smoke Exposure - Never Smoker  . Smokeless tobacco: Never Used  . Tobacco comment: Parents  Substance and Sexual Activity  . Alcohol use: Yes     Alcohol/week: 0.0 standard drinks    Comment: 02/20/2012 "couple glasses of wine/6 months"  . Drug use: No  . Sexual activity: Never

## 2018-03-03 ENCOUNTER — Telehealth: Payer: Self-pay | Admitting: Family Medicine

## 2018-03-03 NOTE — Telephone Encounter (Signed)
Pt called stating she has been dealing with high temps again and wants to know what dr p wants her to do schedule app? Or go to hospital?

## 2018-03-03 NOTE — Telephone Encounter (Signed)
Patient aware of providers recommendations.  

## 2018-03-03 NOTE — Telephone Encounter (Signed)
Raises the concern for autoimmune disease.  Record temps over the next 2 days at meals and bedtime and lets review on Thursday.  Is there any sign of infection?  If so needs to be worked in Architectural technologist.

## 2018-03-03 NOTE — Telephone Encounter (Signed)
Pt has been having fevers on and off with 104 on sat and this am was 100.8. Tylenol brings them down. (appt made for Thursday and told to go to ER if fever gets that high again and wont go dow with tylenol). She also states that since completing the prednisone she is having really bad hand and feet pain. She is using Voltaren gel and tylenol.

## 2018-03-06 ENCOUNTER — Other Ambulatory Visit: Payer: Self-pay

## 2018-03-06 ENCOUNTER — Inpatient Hospital Stay (HOSPITAL_COMMUNITY)
Admission: EM | Admit: 2018-03-06 | Discharge: 2018-03-09 | DRG: 641 | Disposition: A | Discharge status: Home / Self Care | Payer: Medicare Other | Attending: Family Medicine | Admitting: Family Medicine

## 2018-03-06 ENCOUNTER — Encounter (HOSPITAL_COMMUNITY): Payer: Self-pay

## 2018-03-06 ENCOUNTER — Other Ambulatory Visit (HOSPITAL_COMMUNITY): Payer: Self-pay

## 2018-03-06 ENCOUNTER — Emergency Department (HOSPITAL_COMMUNITY): Payer: Medicare Other

## 2018-03-06 ENCOUNTER — Encounter: Payer: Self-pay | Admitting: Family Medicine

## 2018-03-06 ENCOUNTER — Ambulatory Visit: Payer: Medicare Other | Admitting: Family Medicine

## 2018-03-06 VITALS — BP 150/80 | HR 100 | Temp 98.8°F | Resp 20 | Ht 62.0 in

## 2018-03-06 DIAGNOSIS — Z7901 Long term (current) use of anticoagulants: Secondary | ICD-10-CM

## 2018-03-06 DIAGNOSIS — M542 Cervicalgia: Secondary | ICD-10-CM | POA: Diagnosis present

## 2018-03-06 DIAGNOSIS — I82401 Acute embolism and thrombosis of unspecified deep veins of right lower extremity: Secondary | ICD-10-CM | POA: Diagnosis present

## 2018-03-06 DIAGNOSIS — E0500 Thyrotoxicosis with diffuse goiter without thyrotoxic crisis or storm: Secondary | ICD-10-CM | POA: Diagnosis present

## 2018-03-06 DIAGNOSIS — Z91048 Other nonmedicinal substance allergy status: Secondary | ICD-10-CM

## 2018-03-06 DIAGNOSIS — E869 Volume depletion, unspecified: Secondary | ICD-10-CM | POA: Diagnosis present

## 2018-03-06 DIAGNOSIS — J45909 Unspecified asthma, uncomplicated: Secondary | ICD-10-CM | POA: Diagnosis not present

## 2018-03-06 DIAGNOSIS — G8929 Other chronic pain: Secondary | ICD-10-CM | POA: Diagnosis not present

## 2018-03-06 DIAGNOSIS — K219 Gastro-esophageal reflux disease without esophagitis: Secondary | ICD-10-CM | POA: Diagnosis present

## 2018-03-06 DIAGNOSIS — R197 Diarrhea, unspecified: Secondary | ICD-10-CM

## 2018-03-06 DIAGNOSIS — Z9181 History of falling: Secondary | ICD-10-CM

## 2018-03-06 DIAGNOSIS — M790 Rheumatism, unspecified: Secondary | ICD-10-CM

## 2018-03-06 DIAGNOSIS — Z90710 Acquired absence of both cervix and uterus: Secondary | ICD-10-CM

## 2018-03-06 DIAGNOSIS — Z961 Presence of intraocular lens: Secondary | ICD-10-CM | POA: Diagnosis present

## 2018-03-06 DIAGNOSIS — Z79899 Other long term (current) drug therapy: Secondary | ICD-10-CM

## 2018-03-06 DIAGNOSIS — N183 Chronic kidney disease, stage 3 (moderate): Secondary | ICD-10-CM | POA: Diagnosis present

## 2018-03-06 DIAGNOSIS — Z86711 Personal history of pulmonary embolism: Secondary | ICD-10-CM

## 2018-03-06 DIAGNOSIS — E440 Moderate protein-calorie malnutrition: Secondary | ICD-10-CM | POA: Diagnosis present

## 2018-03-06 DIAGNOSIS — M19042 Primary osteoarthritis, left hand: Secondary | ICD-10-CM | POA: Diagnosis not present

## 2018-03-06 DIAGNOSIS — R509 Fever, unspecified: Secondary | ICD-10-CM

## 2018-03-06 DIAGNOSIS — R059 Cough, unspecified: Secondary | ICD-10-CM

## 2018-03-06 DIAGNOSIS — R5383 Other fatigue: Secondary | ICD-10-CM | POA: Diagnosis not present

## 2018-03-06 DIAGNOSIS — Z9842 Cataract extraction status, left eye: Secondary | ICD-10-CM

## 2018-03-06 DIAGNOSIS — F319 Bipolar disorder, unspecified: Secondary | ICD-10-CM | POA: Diagnosis not present

## 2018-03-06 DIAGNOSIS — M797 Fibromyalgia: Secondary | ICD-10-CM | POA: Diagnosis present

## 2018-03-06 DIAGNOSIS — R9389 Abnormal findings on diagnostic imaging of other specified body structures: Secondary | ICD-10-CM | POA: Diagnosis present

## 2018-03-06 DIAGNOSIS — M545 Low back pain: Secondary | ICD-10-CM | POA: Diagnosis present

## 2018-03-06 DIAGNOSIS — Z86718 Personal history of other venous thrombosis and embolism: Secondary | ICD-10-CM

## 2018-03-06 DIAGNOSIS — E039 Hypothyroidism, unspecified: Secondary | ICD-10-CM | POA: Diagnosis present

## 2018-03-06 DIAGNOSIS — M19041 Primary osteoarthritis, right hand: Secondary | ICD-10-CM | POA: Diagnosis present

## 2018-03-06 DIAGNOSIS — E860 Dehydration: Secondary | ICD-10-CM | POA: Diagnosis not present

## 2018-03-06 DIAGNOSIS — Z7951 Long term (current) use of inhaled steroids: Secondary | ICD-10-CM

## 2018-03-06 DIAGNOSIS — Z888 Allergy status to other drugs, medicaments and biological substances status: Secondary | ICD-10-CM

## 2018-03-06 DIAGNOSIS — Z801 Family history of malignant neoplasm of trachea, bronchus and lung: Secondary | ICD-10-CM

## 2018-03-06 DIAGNOSIS — K589 Irritable bowel syndrome without diarrhea: Secondary | ICD-10-CM | POA: Diagnosis present

## 2018-03-06 DIAGNOSIS — Z853 Personal history of malignant neoplasm of breast: Secondary | ICD-10-CM

## 2018-03-06 DIAGNOSIS — A0472 Enterocolitis due to Clostridium difficile, not specified as recurrent: Secondary | ICD-10-CM | POA: Diagnosis present

## 2018-03-06 DIAGNOSIS — Z6822 Body mass index (BMI) 22.0-22.9, adult: Secondary | ICD-10-CM | POA: Diagnosis not present

## 2018-03-06 DIAGNOSIS — A048 Other specified bacterial intestinal infections: Secondary | ICD-10-CM | POA: Diagnosis present

## 2018-03-06 DIAGNOSIS — Z9841 Cataract extraction status, right eye: Secondary | ICD-10-CM

## 2018-03-06 DIAGNOSIS — Z91013 Allergy to seafood: Secondary | ICD-10-CM

## 2018-03-06 DIAGNOSIS — E739 Lactose intolerance, unspecified: Secondary | ICD-10-CM | POA: Diagnosis present

## 2018-03-06 DIAGNOSIS — M810 Age-related osteoporosis without current pathological fracture: Secondary | ICD-10-CM | POA: Diagnosis not present

## 2018-03-06 DIAGNOSIS — R05 Cough: Secondary | ICD-10-CM | POA: Diagnosis present

## 2018-03-06 DIAGNOSIS — E785 Hyperlipidemia, unspecified: Secondary | ICD-10-CM | POA: Diagnosis present

## 2018-03-06 DIAGNOSIS — R531 Weakness: Secondary | ICD-10-CM | POA: Diagnosis not present

## 2018-03-06 DIAGNOSIS — Z981 Arthrodesis status: Secondary | ICD-10-CM

## 2018-03-06 DIAGNOSIS — Z881 Allergy status to other antibiotic agents status: Secondary | ICD-10-CM

## 2018-03-06 DIAGNOSIS — Z885 Allergy status to narcotic agent status: Secondary | ICD-10-CM

## 2018-03-06 DIAGNOSIS — E0590 Thyrotoxicosis, unspecified without thyrotoxic crisis or storm: Secondary | ICD-10-CM | POA: Diagnosis present

## 2018-03-06 DIAGNOSIS — Z880 Allergy status to penicillin: Secondary | ICD-10-CM

## 2018-03-06 DIAGNOSIS — Z8261 Family history of arthritis: Secondary | ICD-10-CM

## 2018-03-06 DIAGNOSIS — R5381 Other malaise: Secondary | ICD-10-CM | POA: Diagnosis present

## 2018-03-06 DIAGNOSIS — M109 Gout, unspecified: Secondary | ICD-10-CM | POA: Diagnosis not present

## 2018-03-06 DIAGNOSIS — Z800 Family history of malignant neoplasm of digestive organs: Secondary | ICD-10-CM

## 2018-03-06 DIAGNOSIS — Z9049 Acquired absence of other specified parts of digestive tract: Secondary | ICD-10-CM

## 2018-03-06 LAB — CORTISOL-AM, BLOOD: Cortisol - AM: 10 ug/dL (ref 6.7–22.6)

## 2018-03-06 LAB — CBC WITH DIFFERENTIAL/PLATELET
Abs Immature Granulocytes: 0.3 10*3/uL — ABNORMAL HIGH (ref 0.00–0.07)
Basophils Absolute: 0.1 10*3/uL (ref 0.0–0.1)
Basophils Relative: 1 %
Eosinophils Absolute: 0.1 10*3/uL (ref 0.0–0.5)
Eosinophils Relative: 1 %
HCT: 32.2 % — ABNORMAL LOW (ref 36.0–46.0)
Hemoglobin: 9.8 g/dL — ABNORMAL LOW (ref 12.0–15.0)
Immature Granulocytes: 3 %
Lymphocytes Relative: 12 %
Lymphs Abs: 1.3 10*3/uL (ref 0.7–4.0)
MCH: 31.2 pg (ref 26.0–34.0)
MCHC: 30.4 g/dL (ref 30.0–36.0)
MCV: 102.5 fL — ABNORMAL HIGH (ref 80.0–100.0)
Monocytes Absolute: 1.4 10*3/uL — ABNORMAL HIGH (ref 0.1–1.0)
Monocytes Relative: 13 %
Neutro Abs: 8 10*3/uL — ABNORMAL HIGH (ref 1.7–7.7)
Neutrophils Relative %: 70 %
Platelets: 436 10*3/uL — ABNORMAL HIGH (ref 150–400)
RBC: 3.14 MIL/uL — ABNORMAL LOW (ref 3.87–5.11)
RDW: 19.9 % — ABNORMAL HIGH (ref 11.5–15.5)
WBC: 11.1 10*3/uL — ABNORMAL HIGH (ref 4.0–10.5)
nRBC: 0 % (ref 0.0–0.2)

## 2018-03-06 LAB — COMPREHENSIVE METABOLIC PANEL
ALT: 25 U/L (ref 0–44)
AST: 31 U/L (ref 15–41)
Albumin: 3 g/dL — ABNORMAL LOW (ref 3.5–5.0)
Alkaline Phosphatase: 58 U/L (ref 38–126)
Anion gap: 13 (ref 5–15)
BUN: 20 mg/dL (ref 8–23)
CO2: 22 mmol/L (ref 22–32)
Calcium: 8.8 mg/dL — ABNORMAL LOW (ref 8.9–10.3)
Chloride: 102 mmol/L (ref 98–111)
Creatinine, Ser: 0.94 mg/dL (ref 0.44–1.00)
GFR calc Af Amer: >60 mL/min (ref >60)
GFR calc non Af Amer: >60 mL/min (ref >60)
Glucose, Bld: 116 mg/dL — ABNORMAL HIGH (ref 70–99)
Potassium: 4.5 mmol/L (ref 3.5–5.1)
Sodium: 137 mmol/L (ref 135–145)
Total Bilirubin: 0.9 mg/dL (ref 0.3–1.2)
Total Protein: 7.6 g/dL (ref 6.5–8.1)

## 2018-03-06 LAB — T4, FREE: Free T4: 1.08 ng/dL (ref 0.82–1.77)

## 2018-03-06 LAB — GLUCOSE, CAPILLARY: Glucose-Capillary: 107 mg/dL — ABNORMAL HIGH (ref 70–99)

## 2018-03-06 LAB — URINALYSIS, ROUTINE W REFLEX MICROSCOPIC
Bilirubin Urine: NEGATIVE
Glucose, UA: NEGATIVE mg/dL
Hgb urine dipstick: NEGATIVE
Ketones, ur: NEGATIVE mg/dL
Leukocytes, UA: NEGATIVE
Nitrite: NEGATIVE
Protein, ur: NEGATIVE mg/dL
Specific Gravity, Urine: 1.011 (ref 1.005–1.030)
pH: 7 (ref 5.0–8.0)

## 2018-03-06 LAB — FERRITIN: Ferritin: 541 ng/mL — ABNORMAL HIGH (ref 11–307)

## 2018-03-06 LAB — POCT I-STAT TROPONIN I: Troponin i, poc: 0.01 ng/mL (ref 0.00–0.08)

## 2018-03-06 LAB — BRAIN NATRIURETIC PEPTIDE: B Natriuretic Peptide: 51.7 pg/mL (ref 0.0–100.0)

## 2018-03-06 LAB — CG4 I-STAT (LACTIC ACID): Lactic Acid, Venous: 1.2 mmol/L (ref 0.5–1.9)

## 2018-03-06 MED ORDER — SODIUM CHLORIDE 0.9 % IV SOLN: Freq: Once | INTRAVENOUS | Status: DC

## 2018-03-06 MED ORDER — SODIUM CHLORIDE 0.9 % IV BOLUS
1000.0000 mL | Freq: Once | INTRAVENOUS | Status: AC
  Administered 2018-03-06: 1000 mL via INTRAVENOUS

## 2018-03-06 MED ORDER — TIOTROPIUM BROMIDE MONOHYDRATE 2.5 MCG/ACT IN AERS: 2.0000 | INHALATION_SPRAY | Freq: Every day | RESPIRATORY_TRACT | Status: DC

## 2018-03-06 MED ORDER — ALBUTEROL SULFATE (2.5 MG/3ML) 0.083% IN NEBU: 2.5000 mg | INHALATION_SOLUTION | RESPIRATORY_TRACT | Status: DC | PRN

## 2018-03-06 MED ORDER — ACETAMINOPHEN 325 MG PO TABS
650.0000 mg | ORAL_TABLET | Freq: Four times a day (QID) | ORAL | Status: DC | PRN
  Administered 2018-03-06 – 2018-03-07 (×2): 650 mg via ORAL
  Filled 2018-03-06 (×2): qty 2

## 2018-03-06 MED ORDER — RIVAROXABAN 20 MG PO TABS
20.0000 mg | ORAL_TABLET | Freq: Every day | ORAL | Status: DC
  Administered 2018-03-06 – 2018-03-08 (×3): 20 mg via ORAL
  Filled 2018-03-06 (×3): qty 1

## 2018-03-06 MED ORDER — PANTOPRAZOLE SODIUM 40 MG PO TBEC
40.0000 mg | DELAYED_RELEASE_TABLET | Freq: Every day | ORAL | Status: DC
  Administered 2018-03-07 – 2018-03-09 (×3): 40 mg via ORAL
  Filled 2018-03-06 (×3): qty 1

## 2018-03-06 MED ORDER — SODIUM CHLORIDE 0.9 % IV SOLN
INTRAVENOUS | Status: DC
  Administered 2018-03-06 – 2018-03-08 (×3): via INTRAVENOUS

## 2018-03-06 MED ORDER — ALBUTEROL SULFATE (2.5 MG/3ML) 0.083% IN NEBU
5.0000 mg | INHALATION_SOLUTION | Freq: Once | RESPIRATORY_TRACT | Status: DC
  Filled 2018-03-06: qty 6

## 2018-03-06 MED ORDER — UMECLIDINIUM BROMIDE 62.5 MCG/INH IN AEPB
1.0000 | INHALATION_SPRAY | Freq: Every day | RESPIRATORY_TRACT | Status: DC
  Administered 2018-03-07 – 2018-03-09 (×3): 1 via RESPIRATORY_TRACT
  Filled 2018-03-06: qty 7

## 2018-03-06 MED ORDER — LORATADINE 10 MG PO TABS
10.0000 mg | ORAL_TABLET | Freq: Every day | ORAL | Status: DC
  Administered 2018-03-06 – 2018-03-09 (×4): 10 mg via ORAL
  Filled 2018-03-06 (×4): qty 1

## 2018-03-06 MED ORDER — ALUM & MAG HYDROXIDE-SIMETH 200-200-20 MG/5ML PO SUSP
15.0000 mL | ORAL | Status: DC | PRN
  Administered 2018-03-08: 15 mL via ORAL
  Filled 2018-03-06: qty 30

## 2018-03-06 NOTE — ED Notes (Signed)
Pt is aware a urine sample and stool sample is needed, but is unable to provide one at this time.

## 2018-03-06 NOTE — ED Notes (Signed)
ED TO INPATIENT HANDOFF REPORT  Name/Age/Gender Ebony Scott 68 y.o. female  Code Status Code Status History    Date Active Date Inactive Code Status Order ID Comments User Context   02/10/2018 1659 02/12/2018 1941 Full Code [id number]  Kerney Elbe, DO ED   01/15/2018 0828 01/23/2018 2110 Full Code [id number]  Cathlyn Parsons, PA-C Inpatient   01/15/2018 0828 01/15/2018 0828 Full Code [id number]  Cathlyn Parsons, PA-C Inpatient   01/14/2018 2027 01/15/2018 0827 Full Code [id number]  Reubin Milan, MD ED   12/13/2017 1706 12/25/2017 0103 Full Code [id number]  Reubin Milan, MD ED   04/15/2016 2127 04/17/2016 1943 Full Code [id number]  Vianne Bulls, MD ED   02/20/2012 1729 02/26/2012 1540 Full Code [id number]  Payton Emerald, RN Inpatient    Advance Directive Documentation     Most Recent Value  Type of Advance Directive  Healthcare Power of Attorney, Living will  Pre-existing out of facility DNR order (yellow form or pink MOST form)  -  "MOST" Form in Place?  -      Home/SNF/Other Home  Chief Complaint Fluid in Lungs; Sent from Dr  Level of Care/Admitting Diagnosis ED Disposition    ED Disposition Condition [place]: [facility] [100102]  Level of Care: Med-Surg [16]  Diagnosis: Clostridial gastroenteritis [093818]  Admitting Physician: [place], [place]  Attending Physician: Nita Sells [phone]  Estimated length of stay: 3 - 4 days  Certification:: I certify this patient will need inpatient services for at least 2 midnights  PT Class (Do Not Modify): Inpatient [101]  PT Acc Code (Do Not Modify): Private [1]       Medical History Past Medical History:  Diagnosis Date  . Allergic rhinitis   . Allergy    SEASONAL  . Anemia   . Anxiety    pt denies  . Arthritis    "mostly the hands" (02/20/2012)  . Asthma   . Breast cancer ([place]) 1998   in remission  . Cataract    REMOVED  .  Chronic lower back pain   . Chronic neck pain   . Complication of anesthesia    "had hard time waking up from it several times" (02/20/2012)  . Depression    "some; don't take anything for it" (02/20/2012)  . Diverticulosis   . DVT (deep venous thrombosis) ([place])   . Exertional dyspnea   . Fibromyalgia 11/2011  . GERD (gastroesophageal reflux disease)   . Graves disease   . Headache(784.0)    "related to allergies; more at different times during the year" (02/20/2012)  . Hemorrhoids   . Hiatal hernia    back and neck  . Hx of adenomatous colonic polyps 04/12/2016  . Hypercholesteremia    good cholesterol is high  . Hypothyroidism   . IBS (irritable bowel syndrome)   . Moderate persistent asthma    -FeV1 72% 2011, -IgE 102 2011, CT sinus Neg 2011  . Osteoporosis    on reclast yearly  . Pneumonia 04/2011; ~ 11/2011   "double; single" (02/20/2012)    Allergies Allergies  Allergen Reactions  . Dust Mite Extract Shortness Of Breath and Other (See Comments)    "sneezing" (02/20/2012)  . Molds & Smuts Shortness Of Breath  . Morphine Sulfate Itching  . Other Shortness Of Breath and Other (See Comments)    Grass and weeds "sneezing; filled sinuses" (02/20/2012)  . Penicillins Rash and Other (See  Comments)    "welts" (02/20/2012) Has patient had a PCN reaction causing immediate rash, facial/tongue/throat swelling, SOB or lightheadedness with hypotension: Unknown Has patient had a PCN reaction causing severe rash involving mucus membranes or skin necrosis: No Has patient had a PCN reaction that required hospitalization: No Has patient had a PCN reaction occurring within the last 10 years: No If all of the above answers are "NO", then may proceed with Cephalosporin use.   Marland Kitchen Reclast [Zoledronic Acid] Other (See Comments)    Fever, Put in hospital, dr said it was a reaction from a reaction   . Rofecoxib Swelling    Vioxx REACTION: feet swelling  . Shrimp Flavor Anaphylaxis    ALL SHELLFISH  .  Tetracycline Hcl Nausea And Vomiting  . Xolair [Omalizumab] Other (See Comments)    Caused Blood clot  . Dilaudid [Hydromorphone Hcl] Itching  . Hydrocodone-Acetaminophen Nausea And Vomiting  . Levofloxacin Other (See Comments)    REACTION: GI upset  . Oxycodone Hcl Nausea And Vomiting  . Paroxetine Nausea And Vomiting    Paxil   . Celecoxib Swelling    Feet swelling  . Diltiazem Swelling  . Lactose Intolerance (Gi) Other (See Comments)    Bloating and gas  . Tree Extract Other (See Comments)    "tested and told I was allergic to it; never experienced a reaction to it" (02/20/2012)    IV Location/Drains/Wounds Patient Lines/Drains/Airways Status   Active Line/Drains/Airways    Name:   Placement date:   Placement time:   Site:   Days:   Peripheral IV 02/11/18 Left;Posterior Forearm   02/11/18    0820    Forearm   23   Peripheral IV 03/06/18 Right Antecubital   03/06/18    1342    Antecubital   less than 1          Labs/Imaging Results for orders placed or performed during the hospital encounter of 03/06/18 (from the past 48 hour(s))  Glucose, capillary     Status: Abnormal   Collection Time: 03/06/18 12:20 PM  Result Value Ref Range   Glucose-Capillary 107 (H) 70 - 99 mg/dL  Brain natriuretic peptide     Status: None   Collection Time: 03/06/18 12:25 PM  Result Value Ref Range   B Natriuretic Peptide 51.7 0.0 - 100.0 pg/mL    Comment: Performed at [facility], [place] [address] [place], [place] [address]  CBC with Differential     Status: Abnormal   Collection Time: 03/06/18 12:28 PM  Result Value Ref Range   WBC 11.1 (H) 4.0 - 10.5 K/uL   RBC 3.14 (L) 3.87 - 5.11 MIL/uL   Hemoglobin 9.8 (L) 12.0 - 15.0 g/dL   HCT 32.2 (L) 36.0 - 46.0 %   MCV 102.5 (H) 80.0 - 100.0 fL   MCH 31.2 26.0 - 34.0 pg   MCHC 30.4 30.0 - 36.0 g/dL   RDW 19.9 (H) 11.5 - 15.5 %   Platelets 436 (H) 150 - 400 K/uL   nRBC 0.0 0.0 - 0.2 %   Neutrophils Relative % 70 %   Neutro  Abs 8.0 (H) 1.7 - 7.7 K/uL   Lymphocytes Relative 12 %   Lymphs Abs 1.3 0.7 - 4.0 K/uL   Monocytes Relative 13 %   Monocytes Absolute 1.4 (H) 0.1 - 1.0 K/uL   Eosinophils Relative 1 %   Eosinophils Absolute 0.1 0.0 - 0.5 K/uL   Basophils Relative 1 %   Basophils  Absolute 0.1 0.0 - 0.1 K/uL   Immature Granulocytes 3 %   Abs Immature Granulocytes 0.30 (H) 0.00 - 0.07 K/uL    Comment: Performed at [facility], [place] [address] [place], [place] [address]  Comprehensive metabolic panel     Status: Abnormal   Collection Time: 03/06/18 12:28 PM  Result Value Ref Range   Sodium 137 135 - 145 mmol/L   Potassium 4.5 3.5 - 5.1 mmol/L   Chloride 102 98 - 111 mmol/L   CO2 22 22 - 32 mmol/L   Glucose, Bld 116 (H) 70 - 99 mg/dL   BUN 20 8 - 23 mg/dL   Creatinine, Ser 0.94 0.44 - 1.00 mg/dL   Calcium 8.8 (L) 8.9 - 10.3 mg/dL   Total Protein 7.6 6.5 - 8.1 g/dL   Albumin 3.0 (L) 3.5 - 5.0 g/dL   AST 31 15 - 41 U/L   ALT 25 0 - 44 U/L   Alkaline Phosphatase 58 38 - 126 U/L   Total Bilirubin 0.9 0.3 - 1.2 mg/dL   GFR calc non Af Amer >60 >60 mL/min   GFR calc Af Amer >60 >60 mL/min   Anion gap 13 5 - 15    Comment: Performed at [facility], [place] [address] [place], [place] [address]  POCT i-Stat troponin I     Status: None   Collection Time: 03/06/18 12:36 PM  Result Value Ref Range   Troponin i, poc 0.01 0.00 - 0.08 ng/mL   Comment 3            Comment: Due to the release kinetics of cTnI, a negative result within the first hours of the onset of symptoms does not rule out myocardial infarction with certainty. If myocardial infarction is still suspected, repeat the test at appropriate intervals.   CG4 I-STAT (Lactic acid)     Status: None   Collection Time: 03/06/18 12:37 PM  Result Value Ref Range   Lactic Acid, Venous 1.20 0.5 - 1.9 mmol/L  Urinalysis, Routine w reflex microscopic     Status: None   Collection Time: 03/06/18  3:26 PM  Result  Value Ref Range   Color, Urine YELLOW YELLOW   APPearance CLEAR CLEAR   Specific Gravity, Urine 1.011 1.005 - 1.030   pH 7.0 5.0 - 8.0   Glucose, UA NEGATIVE NEGATIVE mg/dL   Hgb urine dipstick NEGATIVE NEGATIVE   Bilirubin Urine NEGATIVE NEGATIVE   Ketones, ur NEGATIVE NEGATIVE mg/dL   Protein, ur NEGATIVE NEGATIVE mg/dL   Nitrite NEGATIVE NEGATIVE   Leukocytes, UA NEGATIVE NEGATIVE    Comment: Performed at [facility] [address] [place], [place] [address]   *Note: Due to a large number of results and/or encounters for the requested time period, some results have not been displayed. A complete set of results can be found in Results Review.   Dg Chest 2 View  Result Date: 03/06/2018 CLINICAL DATA:  Fever, shortness of breath, weakness EXAM: CHEST - 2 VIEW COMPARISON:  02/10/2018 FINDINGS: Low volumes with bibasilar atelectasis. Heart is normal size. No effusions or acute bony abnormality. IMPRESSION: Low lung volumes, bibasilar atelectasis. Electronically Signed   By: Rolm Baptise M.D.   On: 03/06/2018 10:47   Ct Head Wo Contrast  Result Date: 03/06/2018 CLINICAL DATA:  Weakness and shortness of breath for 5 days, altered level of consciousness EXAM: CT HEAD WITHOUT CONTRAST TECHNIQUE: Contiguous axial images were obtained from the base of the  skull through the vertex without intravenous contrast. COMPARISON:  12/13/2017 FINDINGS: Brain: Generalized atrophy. Normal ventricular morphology. No midline shift or mass effect. Small vessel chronic ischemic changes of deep cerebral white matter. No intracranial hemorrhage, mass lesion, evidence of acute infarction, or extra-axial fluid collection. Vascular: No hyperdense vessels Skull: Intact Sinuses/Orbits: Clear Other: N/A IMPRESSION: No acute intracranial abnormalities. Electronically Signed   By: Lavonia Dana M.D.   On: 03/06/2018 13:48   EKG Interpretation  Date/Time:  Thursday March 06 2018 10:19:25  EST Ventricular Rate:  103 PR Interval:    QRS Duration: 82 QT Interval:  321 QTC Calculation: 421 R Axis:   47 Text Interpretation:  Sinus tachycardia Anteroseptal infarct, old similar patttern to prior 12/19 Confirmed by Aletta Edouard [phone]) on 03/06/2018 3:16:46 PM   Pending Labs Unresulted Labs (From admission, onward)    Start     Ordered   03/06/18 1212  C difficile quick scan w PCR reflex  (C Difficile quick screen w PCR reflex panel)  Once, for 24 hours,   R     03/06/18 1211   03/06/18 1211  Culture, blood (routine x 2)  BLOOD CULTURE X 2,   STAT     03/06/18 1211          Vitals/Pain Today's Vitals   03/06/18 1012 03/06/18 1017 03/06/18 1212 03/06/18 1352  BP:  118/70 (!) 146/63 (!) 114/53  Pulse:  (!) 103 (!) 106 (!) 103  Resp:  11 (!) 23 19  Temp:  98.8 F (37.1 C) 99 F (37.2 C) 100 F (37.8 C)  TempSrc:  Oral Oral Oral  SpO2:  97% 97% 99%  Weight: 57.2 kg     Height: 5\' 1"  (1.549 m)     PainSc: 0-No pain       Isolation Precautions Enteric precautions (UV disinfection)  Medications Medications  0.9 %  sodium chloride infusion (has no administration in time range)  sodium chloride 0.9 % bolus 1,000 mL (0 mLs Intravenous Stopped 03/06/18 1335)    Mobility non-ambulatory

## 2018-03-06 NOTE — ED Provider Notes (Signed)
[facility] DEPT Provider Note   CSN: [id number] Arrival date & time: 03/06/18  1006     History   Chief Complaint Chief Complaint  Patient presents with  . Weakness  . Shortness of Breath    HPI Ebony Scott is a 68 y.o. female.  She is brought in by her family for about 1 week of decline in her functional status.  She has bad generalized weakness increased shortness of breath dry nonproductive cough intermittent fevers to 103 T-max 2 episodes a day of loose stools and frequent nausea with one episode of vomiting.  History of PE on Xarelto.  History of C. difficile.  She went to her PCPs office today with a lot of difficulty due to her inability to ambulate safely.  He sent her here for evaluation of possible sepsis.  There is also a fall a few days ago when she fell back and struck her head.  The history is provided by the patient and a relative.  Weakness  Severity:  Severe Onset quality:  Gradual Duration:  1 week Timing:  Constant Progression:  Worsening Chronicity:  Recurrent Context: recent infection   Relieved by:  Nothing Worsened by:  Activity Ineffective treatments:  None tried Associated symptoms: cough, diarrhea, difficulty walking, dizziness, fever, headaches, nausea and vomiting   Associated symptoms: no abdominal pain, no chest pain, no dysuria, no shortness of breath, no urgency and no vision change     Past Medical History:  Diagnosis Date  . Allergic rhinitis   . Allergy    SEASONAL  . Anemia   . Anxiety    pt denies  . Arthritis    "mostly the hands" (02/20/2012)  . Asthma   . Breast cancer ([place]) 1998   in remission  . Cataract    REMOVED  . Chronic lower back pain   . Chronic neck pain   . Complication of anesthesia    "had hard time waking up from it several times" (02/20/2012)  . Depression    "some; don't take anything for it" (02/20/2012)  . Diverticulosis   . DVT (deep venous thrombosis) ([place])   . Exertional  dyspnea   . Fibromyalgia 11/2011  . GERD (gastroesophageal reflux disease)   . Graves disease   . Headache(784.0)    "related to allergies; more at different times during the year" (02/20/2012)  . Hemorrhoids   . Hiatal hernia    back and neck  . Hx of adenomatous colonic polyps 04/12/2016  . Hypercholesteremia    good cholesterol is high  . Hypothyroidism   . IBS (irritable bowel syndrome)   . Moderate persistent asthma    -FeV1 72% 2011, -IgE 102 2011, CT sinus Neg 2011  . Osteoporosis    on reclast yearly  . Pneumonia 04/2011; ~ 11/2011   "double; single" (02/20/2012)    Patient Active Problem List   Diagnosis Date Noted  . Cellulitis of forearm   . Fever 02/10/2018  . SIRS (systemic inflammatory response syndrome) ([place]) 02/10/2018  . Nausea and vomiting 02/10/2018  . Diarrhea 02/10/2018  . Abdominal pain 02/10/2018  . Fracture of base of fifth metatarsal bone with routine healing, left   . Closed fracture of fifth metatarsal bone of right foot, initial encounter 01/15/2018  . Clostridium difficile diarrhea 01/14/2018  . Clostridium difficile colitis 01/14/2018  . DVT (deep venous thrombosis) ([place]) 01/14/2018  . Hypothyroidism 01/14/2018  . Depression 01/14/2018  . Debility   . Acute left  ankle pain   . Malnutrition of moderate degree 12/17/2017  . Knee pain   . History of breast cancer   . Chronic pain syndrome   . Fibromyalgia   . Graves disease   . Leukocytosis   . Sepsis due to undetermined organism ([place]) 12/13/2017  . Rhabdomyolysis 12/13/2017  . Acute deep vein thrombosis (DVT) of right lower extremity ([place] [address] [place]) 12/13/2017  . Multiple lung nodules on CT   . CKD (chronic kidney disease), stage III ([place]) 04/15/2016  . Hx of adenomatous colonic polyps 04/12/2016  . Cough 02/09/2016  . Opacity of lung on imaging study 11/03/2015  . Severe persistent asthma 02/08/2015  . Facial pain syndrome 02/08/2015  . Insomnia 02/08/2015  . Other allergic rhinitis 02/08/2015  .  LPRD (laryngopharyngeal reflux disease) 02/08/2015  . Chronic diastolic CHF (congestive heart failure) ([place]) 02/08/2015  . Nausea without vomiting 04/08/2014  . Benign paroxysmal positional vertigo 12/03/2012  . Dizziness and giddiness 10/29/2012  . Pseudogout 02/24/2012  . Pneumonia 05/31/2011  . Irritable bowel syndrome 01/02/2010  . Hyperlipidemia 12/23/2009  . Hyperthyroidism 11/12/2006  . Seasonal and perennial allergic rhinitis 11/12/2006  . GERD 11/12/2006  . NECK PAIN, CHRONIC 11/12/2006  . OSTEOPOROSIS 11/12/2006  . BREAST CANCER, HX OF 11/12/2006    Past Surgical History:  Procedure Laterality Date  . ANTERIOR AND POSTERIOR REPAIR  1990's  . APPENDECTOMY    . BREAST LUMPECTOMY  1998   left  . CARPOMETACARPEL ([place]) FUSION OF THUMB WITH AUTOGRAFT FROM RADIUS  ~ 2009   "both thumbs" (02/20/2012)  . CATARACT EXTRACTION W/ INTRAOCULAR LENS  IMPLANT, BILATERAL  2012  . CERVICAL DISCECTOMY  10/2001   C5-C6  . CERVICAL FUSION  2003   C3-C4  . CHOLECYSTECTOMY    . COLONOSCOPY    . DEBRIDEMENT TENNIS ELBOW  ?1970's   right  . ESOPHAGOGASTRODUODENOSCOPY    . HYSTERECTOMY    . KNEE ARTHROPLASTY  ?1990's   "?right; w/cartilage repair" (02/20/2012)  . NASAL SEPTUM SURGERY  1980's  . POSTERIOR CERVICAL FUSION/FORAMINOTOMY  2004   "failed initial fusion; rewired  anterior neck" (02/20/2012)  . TONSILLECTOMY  ~ 1953  . VESICOVAGINAL FISTULA CLOSURE W/ TAH  1988  . VIDEO BRONCHOSCOPY Bilateral 08/23/2016   Procedure: VIDEO BRONCHOSCOPY WITH FLUORO;  Surgeon: Javier Glazier, MD;  Location: [facility];  Service: Cardiopulmonary;  Laterality: Bilateral;     OB History   No obstetric history on file.      Home Medications    Prior to Admission medications   Medication Sig Start Date End Date Taking? Authorizing Provider  albuterol (PROAIR HFA) 108 (90 Base) MCG/ACT inhaler Inhale 2 puffs into the lungs every 4 (four) hours as needed for wheezing or shortness of breath.  02/18/17   [name], MD  Alpha-D-Galactosidase (BEANO PO) Take 1 capsule by mouth 3 (three) times daily as needed (gas/ bloating).     [provider]  Alpha-Lipoic Acid 600 MG CAPS Take 600 mg by mouth daily.     [provider]  alum & mag hydroxide-simeth (MAALOX/MYLANTA) 200-200-20 MG/5ML suspension Take 15 mLs by mouth every 4 (four) hours as needed for indigestion or heartburn. 01/23/18   Aline August, MD  azelastine (ASTELIN) 0.1 % nasal spray USE 2 SPRAYS IN EACH NOSTRIL DAILY AS DIRECTED 07/16/17   Kozlow, Donnamarie Poag, MD  CALCIUM-MAGNESIUM PO Take 1 tablet by mouth at bedtime.    [provider]  cetirizine (ZYRTEC) 10 MG tablet Take 5  mg by mouth every evening.     [provider]  cholecalciferol (VITAMIN D) 25 MCG (1000 UT) tablet Take 1,000 Units by mouth daily.     [provider]  cyproheptadine (PERIACTIN) 4 MG tablet TAKE 2 TABLETS BY MOUTH EVERY EVENING Patient taking differently: TAKE 1 TABLET BY MOUTH EVERY EVENING 07/16/17   Kozlow, Donnamarie Poag, MD  denosumab (PROLIA) 60 MG/ML SOLN injection Inject 60 mg into the skin every 6 (six) months. Administer in upper arm, thigh, or abdomen    [provider]  dexlansoprazole (DEXILANT) 60 MG capsule Take 1 capsule (60 mg total) by mouth daily. 07/16/17   Kozlow, Donnamarie Poag, MD  diclofenac sodium (VOLTAREN) 1 % GEL Apply 2 g topically 2 (two) times daily. 01/23/18   Aline August, MD  EPINEPHrine (EPIPEN 2-PAK) 0.3 mg/0.3 mL IJ SOAJ injection Inject 0.3 mLs (0.3 mg total) into the muscle Once PRN for up to 1 dose. 12/02/17   Juanito Doom, MD  gabapentin (NEURONTIN) 600 MG tablet Take 600 mg by mouth at bedtime.    [provider]  Hydrocortisone (GERHARDT'S BUTT CREAM) CREA Apply 1 application topically 4 (four) times daily. 02/11/18   Georgette Shell, MD  Lactase (LACTAID FAST ACT) 9000 units TABS Take 18,000 Units by mouth 4 (four) times daily as needed (dairy  consumption).     [provider]  lidocaine (LIDODERM) 5 % Place 1 patch onto the skin daily as needed (pain). Remove & Discard patch within 12 hours or as directed by MD 01/23/18   Aline August, MD  mirtazapine (REMERON SOL-TAB) 30 MG disintegrating tablet DISSOLVE 1 TABLET IN MOUTH EVERY NIGHT AT BEDTIME 01/10/18   Gatha Mayer, MD  mometasone (NASONEX) 50 MCG/ACT nasal spray Place 2 sprays into the nose daily. 01/23/18   Aline August, MD  mupirocin ointment (BACTROBAN) 2 % Place 1 application into the nose at bedtime. To prevent nose bleeds 01/23/18   Aline August, MD  nystatin (MYCOSTATIN) 100000 UNIT/ML suspension Use as directed 5 mLs (500,000 Units total) in the mouth or throat at bedtime. Swish and spit 01/23/18   Aline August, MD  PRESCRIPTION MEDICATION Place 2 puffs into both ears once a week. Place capsule in dispenser and instill 2 puffs in each ear once a week after washing hair. CS (X) AHCL    CHLORAMP/SULFU/AMPHO B/ H DROC 100-100-5 mg capsule - compounded at [facility]    [provider]  Probiotic Product (ALIGN) 4 MG CAPS Take 4 mg by mouth every evening.     [provider]  Propylene Glycol-Glycerin (SOOTHE OP) Place 1 drop into both eyes 2 (two) times daily as needed (dry eyes).     [provider]  ranitidine (ZANTAC) 300 MG tablet Take 1 tablet (300 mg total) by mouth every evening. 01/23/18   Aline August, MD  Respiratory Therapy Supplies (FLUTTER) DEVI Use as directed 10/22/14   Elsie Stain, MD  rivaroxaban (XARELTO) 20 MG TABS tablet Take 1 tablet (20 mg total) by mouth daily with supper. 02/07/18   [name], MD  saccharomyces boulardii (FLORASTOR) 250 MG capsule Take 1 capsule (250 mg total) by mouth 2 (two) times daily. 01/24/18   [name], MD  silver sulfADIAZINE (SILVADENE) 1 % cream Apply 1 application topically daily. 02/07/18   [name], MD  Spacer/Aero-Holding Chambers (AEROCHAMBER MV)  inhaler Use as instructed 01/23/16   Javier Glazier, MD  Tiotropium Bromide Monohydrate [facility]  RESPIMAT) 2.5 MCG/ACT AERS Inhale 2 puffs into the lungs daily. 07/16/17   Kozlow, Donnamarie Poag, MD    Family History Family History  Problem Relation Age of Onset  . Allergies Mother   . Heart disease Mother   . Arthritis Mother   . Lung cancer Mother   . Diabetes Mother   . Allergies Father   . Heart disease Father   . Arthritis Father   . Stroke Father   . Colon cancer Other        Maternal half aunt/Maternal half uncle  . Colitis Daughter   . Diabetes Maternal Grandfather     Social History Social History   Tobacco Use  . Smoking status: Passive Smoke Exposure - Never Smoker  . Smokeless tobacco: Never Used  . Tobacco comment: Parents  Substance Use Topics  . Alcohol use: Not Currently    Alcohol/week: 0.0 standard drinks  . Drug use: No     Allergies   Dust mite extract; Molds & smuts; Morphine sulfate; Other; Penicillins; Reclast [zoledronic acid]; Rofecoxib; Shrimp flavor; Tetracycline hcl; Xolair [omalizumab]; Dilaudid [hydromorphone hcl]; Hydrocodone-acetaminophen; Levofloxacin; Oxycodone hcl; Paroxetine; Celecoxib; Diltiazem; Lactose intolerance (gi); and Tree extract   Review of Systems Review of Systems  Constitutional: Positive for fever.  HENT: Negative for sore throat.   Eyes: Negative for visual disturbance.  Respiratory: Positive for cough. Negative for shortness of breath.   Cardiovascular: Negative for chest pain.  Gastrointestinal: Positive for diarrhea, nausea and vomiting. Negative for abdominal pain.  Genitourinary: Negative for dysuria and urgency.  Musculoskeletal: Positive for gait problem. Negative for back pain.  Skin: Negative for rash.  Neurological: Positive for dizziness, weakness and headaches.     Physical Exam Updated Vital Signs BP 118/70 (BP Location: Left Arm)   Pulse (!) 103   Temp 98.8 F (37.1 C) (Oral)   Resp 11   Ht 5\' 1"   (1.549 m)   Wt 57.2 kg   SpO2 97%   BMI 23.81 kg/m   Physical Exam Vitals signs and nursing note reviewed.  Constitutional:      General: She is not in acute distress.    Appearance: She is well-developed.  HENT:     Head: Normocephalic and atraumatic.  Eyes:     Conjunctiva/sclera: Conjunctivae normal.  Neck:     Musculoskeletal: Neck supple.  Cardiovascular:     Rate and Rhythm: Normal rate and regular rhythm.     Heart sounds: No murmur.  Pulmonary:     Effort: Pulmonary effort is normal. No respiratory distress.     Breath sounds: Normal breath sounds.  Abdominal:     Palpations: Abdomen is soft.     Tenderness: There is no abdominal tenderness.  Musculoskeletal: Normal range of motion.     Right lower leg: She exhibits tenderness. No edema.     Left lower leg: She exhibits no tenderness. No edema.     Comments: She some tenderness at her right Achilles that is been ongoing.  Skin:    General: Skin is warm and dry.     Capillary Refill: Capillary refill takes less than 2 seconds.  Neurological:     Mental Status: She is alert.     Cranial Nerves: No cranial nerve deficit.     Motor: No weakness.      ED Treatments / Results  Labs (all labs ordered are listed, but only abnormal results are displayed) Labs Reviewed  CBC WITH DIFFERENTIAL/PLATELET - Abnormal; Notable for the following  components:      Result Value   WBC 11.1 (*)    RBC 3.14 (*)    Hemoglobin 9.8 (*)    HCT 32.2 (*)    MCV 102.5 (*)    RDW 19.9 (*)    Platelets 436 (*)    Neutro Abs 8.0 (*)    Monocytes Absolute 1.4 (*)    Abs Immature Granulocytes 0.30 (*)    All other components within normal limits  COMPREHENSIVE METABOLIC PANEL - Abnormal; Notable for the following components:   Glucose, Bld 116 (*)    Calcium 8.8 (*)    Albumin 3.0 (*)    All other components within normal limits  GLUCOSE, CAPILLARY - Abnormal; Notable for the following components:   Glucose-Capillary 107 (*)     All other components within normal limits  CULTURE, BLOOD (ROUTINE X 2)  CULTURE, BLOOD (ROUTINE X 2)  C DIFFICILE QUICK SCREEN W PCR REFLEX  BRAIN NATRIURETIC PEPTIDE  URINALYSIS, ROUTINE W REFLEX MICROSCOPIC  CBG MONITORING, ED  I-STAT CG4 LACTIC ACID, ED  CG4 I-STAT (LACTIC ACID)  POCT I-STAT TROPONIN I  I-STAT CG4 LACTIC ACID, ED    EKG EKG Interpretation  Date/Time:  Thursday March 06 2018 10:19:25 EST Ventricular Rate:  103 PR Interval:    QRS Duration: 82 QT Interval:  321 QTC Calculation: 421 R Axis:   47 Text Interpretation:  Sinus tachycardia Anteroseptal infarct, old similar patttern to prior 12/19 Confirmed by Aletta Edouard [id number]) on 03/06/2018 3:16:46 PM   Radiology Dg Chest 2 View  Result Date: 03/06/2018 CLINICAL DATA:  Fever, shortness of breath, weakness EXAM: CHEST - 2 VIEW COMPARISON:  02/10/2018 FINDINGS: Low volumes with bibasilar atelectasis. Heart is normal size. No effusions or acute bony abnormality. IMPRESSION: Low lung volumes, bibasilar atelectasis. Electronically Signed   By: Rolm Baptise M.D.   On: 03/06/2018 10:47    Procedures Procedures (including critical care time)  Medications Ordered in ED Medications  0.9 %  sodium chloride infusion (has no administration in time range)  sodium chloride 0.9 % bolus 1,000 mL (1,000 mLs Intravenous New Bag/Given 03/06/18 1234)     Initial Impression / Assessment and Plan / ED Course  I have reviewed the triage vital signs and the nursing notes.  Pertinent labs & imaging results that were available during my care of the patient were reviewed by me and considered in my medical decision making (see chart for details).  Clinical Course as of Mar 06 1513  Thu Mar 06, 2018  1506 Patient's work-up is been fairly unremarkable.  Family states she will be unable to go home.  They said she has been on and off sick for months and she is having this intermittent fever and generalized weakness.  It seems to  relate is when she gets off the prednisone all of her symptoms flareup again.  Looks like in the PCP note there had some concerns for some autoimmune disease which not really sure about.   [MB]    Clinical Course User Index [MB] Hayden Rasmussen, MD     Final Clinical Impressions(s) / ED Diagnoses   Final diagnoses:  Generalized weakness  Intermittent fever  Cough  Diarrhea, unspecified type    ED Discharge Orders    None       Hayden Rasmussen, MD 03/06/18 1544

## 2018-03-06 NOTE — ED Notes (Signed)
Pt placed on Purewick. Pericare was performed prior to placement. Vacuum set to 43mmHg.

## 2018-03-06 NOTE — Progress Notes (Signed)
Subjective:    Patient ID: Ebony Scott, female    DOB: 31-Jul-1950, 68 y.o.   MRN: [id number]  HPI  She was recently admitted to the hospital having been found with altered mental status lying in the floor for several days.  She was found to have a RLE DVT and  Small bilateral PE's was also found to be in rhabdomyolysis.  His discharge from the hospital on November 4 to a skilled nursing facility.  She was recently readmitted on November 26 with nausea vomiting and diarrhea and was found to have C. difficile colitis there was presumed to be acquired in the nursing home.  I have copied relevant portions of the discharge summary below and included them for my reference: Admit date: 01/14/2018 Discharge date: 01/23/2018  Admitted From: Home Disposition: Home  Recommendations for Outpatient Follow-up:  1. Follow up with PCP in 1week with repeat CBC/CMP 2. Follow-up with Dr. Sharol Given as an outpatient  3. Patient will benefit from outpatient evaluation by GI 4. Follow-up with ED if symptoms worsen or new appear   Home Health: Yes: PT/RN Equipment/Devices: None Discharge Condition: Stable CODE STATUS: Full Diet recommendation: Heart Healthy  Brief/Interim Summary: 68 year old female with history of allergic rhinitis, seasonal allergies, asthma, anemia, anxiety, depression, also arthritis, pseudogout, breast cancer in remission, cataracts, chronic lower back pain, chronic neck pain, bilateral DVTs, diverticulosis, fibromyalgia, GERD, Graves' disease, hyperthyroidism, IBS, osteoporosis, pneumonia who was recently admitted to [facility] for acute PE and DVT and was started on anticoagulation and was discharged to skilled nursing facility presented with worsening GI symptoms with nausea, vomiting and diarrhea. She was started on oral vancomycin for positive C. difficile antigen. She had significant left hip pain which is thought to be pseudogout and was started on steroids.  Her condition has improved.   She will be discharged home on oral vancomycin and steroids.  Discharge Diagnoses:  Principal Problem:   Clostridium difficile colitis Active Problems:   Hyperthyroidism   GERD   Pseudogout   Chronic diastolic CHF (congestive heart failure) (HCC)   CKD (chronic kidney disease), stage III (HCC)   Debility   Clostridium difficile diarrhea   DVT (deep venous thrombosis) (HCC)   Depression   Closed fracture of fifth metatarsal bone of right foot, initial encounter   Fracture of base of fifth metatarsal bone with routine healing, left  C. difficile colitis -Diarrhea improving.   Currently on oral vancomycin.  Finish 14-day course of oral vancomycin -Currently afebrile. -Discharge patient home  Left hip pain most likely due to acute pseudogout -X-ray/CT scan of the pelvis was negative for acute abnormality. MRI showed small joint effusion and also partial tear of the gluteus minimus with edema. These findings were discussed with orthopedic surgeon Dr. Cheri Fowler prior hospitalist who recommended arthrocentesis. Patient underwent arthrocentesis on 01/01/2018 however only had minimal amount of clear fluid yellow fluid was aspirated and it was not enough for cell counts or crystal examination. Cultures have been negative so far. Patient had extremely elevated CRP at 15.8. CRP improving  at 6.5 on 01/22/2018. -Patient was empirically started on intravenous Solu-Medrol.   It has been switched to oral prednisone from today.  She feels better.  Will discharge on oral prednisone 40 mg daily for 7 days.  Outpatient follow-up with PCP and/or orthopedics -PT recommended SNF but patient refused.  Patient will need home health upon discharge.  Bilateral fifth metatarsal closed fracture -Orthopedic evaluation by Dr. Sharol Given appreciated.  Patient will need to  wear fracture boots as per Dr. Sharol Given.  Outpatient follow-up with Dr. Sharol Given.    Abnormal LFTs -Improving -LFTs were elevated in November 25, 2017 as  well for which right upper quadrant ultrasound/CT abdomen was negative for any acute findings. She is status post cholecystectomy. No worsening abdominal pain -Off Singulair and statin -Outpatient follow-up  History of chronic diastolic CHF -Compensated. Of IV fluids.   Outpatient follow-up  Chronic kidney disease stage III -Stable.   Outpatient follow-up  Generalized deconditioning -Continue PT eval. Patient is declining SNF placement.  Discharge home with home health.  Macrocytic anemia -Hemoglobin stable.   Outpatient follow-up  History of acute PE and DVT diagnosed recently -Continue Xarelto  Depression -Continue Remeron  Hyperthyroidism -No longer on methimazole. TSH was 5 in October 2019. Outpatient monitor  Severe protein calorie malnutrition -Outpatient follow-up   01/24/18 She is here today for follow-up.  She is on day 9 of 14 of vancomycin 4 times daily.  She states that she is feeling much better and the diarrhea has improved.  The pain in her left hip is also improved.  She is currently on 40 mill grams a day of prednisone for pseudogout.  I recommended decreasing the prednisone to 20 mg a day over the next 3 days and then discontinuing altogether.  She is not currently taking a probiotic and I recommended that she had Florastor.  She denies any fevers or chills.  She denies any abdominal pain.  She denies any hematochezia.  She is accompanied today by her daughter.  We had a long discussion about her polypharmacy.  The majority of her medications or over-the-counter supplements that the patient has elected to take on her own.  At present I am prescribing Remeron for depression and insomnia.  I have recommended that she stay on Xarelto indefinitely given her previous history of DVTs and now a second unprovoked DVT.  I am also prescribing gabapentin 300 mg p.o. twice daily for neuropathic pain and chronic low back pain.  She believes she benefits from this.   I recommended that she discontinue her statin as well as any Tylenol due to her elevated liver function test.  She is receiving her pulmonary medication from her pulmonologist.  She is on allergy medication through the care of her allergist.  Her only on a proton pump inhibitor as well as an H2 blocker.  Believe this was started due to her respiratory issues.  I believe the patient can discontinue these medications if she is not experiencing reflux.  At that time, my plan was: C. difficile colitis seems to be symptomatically improving.  Complete vancomycin but add Florastor 250 mg p.o. twice daily as a probiotic.  I recommended that she continue Xarelto 20 mg a day indefinitely.  She has a history of DVT.  Now she has a history of an unprovoked DVT and bilateral PEs causing syncope.  Therefore I recommended that her risk of current DVT is too high and that she stay on long-term anticoagulation.  My biggest concern moving forward is her persistently elevated LFTs.  Yesterday in the emergency room, they were found to be still persistently elevated in the 100-200 range.  She has had a right upper quadrant ultrasound as well as a CT scan of the abdomen and pelvis to see no visible abnormality in the liver.  Viral hepatitis serologies have been negative.  I would like the patient to come back in 2 weeks to repeat her liver function test when  she is not acutely ill.  If persistently elevated, I would recommend screen the patient for alpha-1 antitrypsin deficiency given her pulmonary issues as well as checking for autoimmune hepatitis and hemochromatosis.  If no other explanation is discovered, I would recommend a GI consultation to discuss possible biopsy.  02/07/18 Recently suffered a skin tear to her right arm on the dorsum of her right forearm.  Over the last week she has been performing dressing changes at home with the care of in-home nursing.  She has now developed fevers as high as 101.  The dorsum of the right  arm is erythematous, hot to the touch, there is a weeping superficial ulcer on the dorsum of the right arm.  It appears to be a large skin tear that is approximately 10 x 5 cm.  It is oozing green-yellowish purulent foul-smelling material.  There is erythema spreading around the wound to a total diameter of 17 cm x 8 cm.  She appears to a developed a secondary cellulitis.  At that time, my plan was: The wound was covered in Silvadene, covered with nonadherent gauze, and wrapped gently and Covan.  Wound care was discussed.  Recommend doxycycline 100 mg p.o. twice daily for 10 days given her history of an allergy to penicillin.  This should allow strep and staph coverage including MRSA.  Given her recent elevations in liver function test, I will check a CMP to monitor her liver function test, check an ANA as well as antimitochondrial antibodies and anti-smooth muscle antibodies to evaluate for autoimmune hepatitis.  I have recommended using Tylenol sparingly for fever if greater than 101.  Given Xarelto I want her to avoid NSAIDs however if the fever rises greater than 101 and occasional use of ibuprofen for fever would be reasonable.  02/21/2018 Patient presents today with several concerns.  First issue is the cellulitis on her right forearm.  After I saw the patient, she continued to have fevers and was ultimately admitted to the hospital the following Monday.  In the hospital she remained afebrile.  She was given vancomycin and Ancef overnight and then was discharged home.  She has now completed the doxycycline I prescribed to her.  Blood cultures were negative in the hospital.  The wound on the right forearm has completely healed.  The ulcer and abrasion looks much better.  There is some postinflammatory hyperpigmentation that is pink but there is no obvious erythema or warmth or redness or pain.  Therefore this issue is closed.  Second issue is the elevated LFTs that I mentioned at her first visit.  ANA was  negative.  Anti-smooth muscle antibodies were negative.  There was no evidence of autoimmune hepatitis.  Her most recent liver function test have normalized.  Therefore I do not believe that there is any autoimmune hepatitis or other ongoing issue with her liver.  Third issue with the C. difficile colitis.  She has discontinued oral vancomycin.  She has had no further diarrhea.  She denies any abdominal pain.  Therefore this issue is closed.  Fourth issue is polyarthralgias.  She continues to remain on prednisone.  This was prescribed to her in the hospital.  I am concerned about her already fragile bone health as she has been diagnosed with osteoporosis.  At the present time she is in bilateral Cam walkers due to fifth metatarsal fractures.  Therefore I believe prolonged prednisone is only can exacerbate this issue.  However we are unaware of what is causing her  diffuse body pain.  The pain is primarily in her feet.  At other times will move to her hands and her finger joints.  It will the fingers become red hot and swollen according to the patient.  She takes prednisone, the symptoms are controlled well.  Fifth issue are night sweats.  She states that she has been having night sweats.  This is become an issue after she left the hospital.  She denies any fever since leaving the hospital.  Sixth issue is anemia.  Her hemoglobin dropped to 8.8 while in the hospital.  Patient is on Xarelto for DVT.  She denies any melena or hematochezia.  She denies any hematemesis.  Therefore this is anemia of unknown origin.  At that time, my plan was: Problem #1 cellulitis of the forearm.  This is resolved after doxycycline and the wound is healed.  No further follow-up is necessary for this.  Problem #2 C. difficile colitis.  This has seemingly resolved.  No further follow-up for this.  Problem #3 elevated liver function test.  These have resolved as well.  Repeat a CMP today.  Monitor closely.  Withhold statin at the current  time in case this was statin induced liver formation.  Patient can resume Tylenol 1000 mg twice daily as needed for body aches.  We will recheck her liver function test and her cholesterol in 3 months.  Problem #4 polyarthralgias primarily in the hands and feet.  This is been presumed to be due to pseudogout.  However I have recommended a rheumatology consult.  I will also screen the patient for rheumatoid arthritis with rheumatoid factor and anti-CCP.  Her ANA has already been proven to be negative.  Problem #5 or night sweats.  This may be medication or hormone based.  I recommended that the patient check her temperature to see if she is actually running a fever at night.  If so we will need to work-up fever of unknown origin.  Problem #6 her hypothyroidism.  This normalized after she discontinued her methimazole.  Recheck TSH in 3 months.  Last checked in the hospital on December 23 was normal.  03/06/18 Patient presents today with altered mental status.  Apparently starting Friday she started having fevers off and on.  The fevers have been as high as 103.0.  She has become progressively more lethargic and confused.  This morning she is sitting in a wheelchair unable to stand.  She is unable to speak.  When I asked her to tell me her symptoms, she can simply repeat over and over I do not know.  I try to perform a Mini-Mental status exam.  Patient is able to tell me the correct day and month as well as location.  She can only remember 1 out of 3 objects on recall.  When I asked her to spell world in reverse, she continues to spell it forwards confused as to what reverse or backwards mean.  She had to be carried out of her house this morning due to weakness and fatigue.  She has been having a cough as well as diarrhea over the last 4 days.  Today she appears dehydrated.  Her complexion is sallow.  She denies any shortness of breath.  However mucous membranes are dry on examination.  She has prominent bibasilar  crackles suggesting either atelectasis or possibly bilateral pneumonia.  Regarding the work-up for fever of unknown origin, the patient's had a negative ANA, negative anti-smooth muscle antibody, negative toyed factor,  negative anti-CCP.  She had a CT scan of the abdomen and pelvis as well as chest in December 23.  However the patient today is clearly altered and delirious with off and on fevers concerning for sepsis of unknown origin  Past Medical History:  Diagnosis Date  . Allergic rhinitis   . Allergy    SEASONAL  . Anemia   . Anxiety    pt denies  . Arthritis    "mostly the hands" (02/20/2012)  . Asthma   . Breast cancer ([place]) 1998   in remission  . Cataract    REMOVED  . Chronic lower back pain   . Chronic neck pain   . Complication of anesthesia    "had hard time waking up from it several times" (02/20/2012)  . Depression    "some; don't take anything for it" (02/20/2012)  . Diverticulosis   . DVT (deep venous thrombosis) ([place])   . Exertional dyspnea   . Fibromyalgia 11/2011  . GERD (gastroesophageal reflux disease)   . Graves disease   . Headache(784.0)    "related to allergies; more at different times during the year" (02/20/2012)  . Hemorrhoids   . Hiatal hernia    back and neck  . Hx of adenomatous colonic polyps 04/12/2016  . Hypercholesteremia    good cholesterol is high  . Hypothyroidism   . IBS (irritable bowel syndrome)   . Moderate persistent asthma    -FeV1 72% 2011, -IgE 102 2011, CT sinus Neg 2011  . Osteoporosis    on reclast yearly  . Pneumonia 04/2011; ~ 11/2011   "double; single" (02/20/2012)   Past Surgical History:  Procedure Laterality Date  . ANTERIOR AND POSTERIOR REPAIR  1990's  . APPENDECTOMY    . BREAST LUMPECTOMY  1998   left  . CARPOMETACARPEL ([place]) FUSION OF THUMB WITH AUTOGRAFT FROM RADIUS  ~ 2009   "both thumbs" (02/20/2012)  . CATARACT EXTRACTION W/ INTRAOCULAR LENS  IMPLANT, BILATERAL  2012  . CERVICAL DISCECTOMY  10/2001   C5-C6  .  CERVICAL FUSION  2003   C3-C4  . CHOLECYSTECTOMY    . COLONOSCOPY    . DEBRIDEMENT TENNIS ELBOW  ?1970's   right  . ESOPHAGOGASTRODUODENOSCOPY    . HYSTERECTOMY    . KNEE ARTHROPLASTY  ?1990's   "?right; w/cartilage repair" (02/20/2012)  . NASAL SEPTUM SURGERY  1980's  . POSTERIOR CERVICAL FUSION/FORAMINOTOMY  2004   "failed initial fusion; rewired  anterior neck" (02/20/2012)  . TONSILLECTOMY  ~ 1953  . VESICOVAGINAL FISTULA CLOSURE W/ TAH  1988  . VIDEO BRONCHOSCOPY Bilateral 08/23/2016   Procedure: VIDEO BRONCHOSCOPY WITH FLUORO;  Surgeon: Javier Glazier, MD;  Location: [facility];  Service: Cardiopulmonary;  Laterality: Bilateral;   Current Outpatient Medications on File Prior to Visit  Medication Sig Dispense Refill  . albuterol (PROAIR HFA) 108 (90 Base) MCG/ACT inhaler Inhale 2 puffs into the lungs every 4 (four) hours as needed for wheezing or shortness of breath. 18 g 5  . Alpha-D-Galactosidase (BEANO PO) Take 1 capsule by mouth 3 (three) times daily as needed (gas/ bloating).     . Alpha-Lipoic Acid 600 MG CAPS Take 600 mg by mouth daily.     Marland Kitchen alum & mag hydroxide-simeth (MAALOX/MYLANTA) 200-200-20 MG/5ML suspension Take 15 mLs by mouth every 4 (four) hours as needed for indigestion or heartburn. 355 mL 0  . azelastine (ASTELIN) 0.1 % nasal spray USE 2 SPRAYS IN EACH NOSTRIL DAILY AS DIRECTED 30 mL 5  .  CALCIUM-MAGNESIUM PO Take 1 tablet by mouth at bedtime.    . cetirizine (ZYRTEC) 10 MG tablet Take 5 mg by mouth every evening.     . cholecalciferol (VITAMIN D) 25 MCG (1000 UT) tablet Take 1,000 Units by mouth daily.     . cyproheptadine (PERIACTIN) 4 MG tablet TAKE 2 TABLETS BY MOUTH EVERY EVENING (Patient taking differently: TAKE 1 TABLET BY MOUTH EVERY EVENING) 60 tablet 5  . denosumab (PROLIA) 60 MG/ML SOLN injection Inject 60 mg into the skin every 6 (six) months. Administer in upper arm, thigh, or abdomen    . dexlansoprazole (DEXILANT) 60 MG capsule Take 1 capsule (60  mg total) by mouth daily. 30 capsule 5  . diclofenac sodium (VOLTAREN) 1 % GEL Apply 2 g topically 2 (two) times daily.    Marland Kitchen EPINEPHrine (EPIPEN 2-PAK) 0.3 mg/0.3 mL IJ SOAJ injection Inject 0.3 mLs (0.3 mg total) into the muscle Once PRN for up to 1 dose. 1 Device 1  . gabapentin (NEURONTIN) 600 MG tablet Take 600 mg by mouth at bedtime.    Marland Kitchen Hydrocortisone (GERHARDT'S BUTT CREAM) CREA Apply 1 application topically 4 (four) times daily. 1 each 1  . Lactase (LACTAID FAST ACT) 9000 units TABS Take 18,000 Units by mouth 4 (four) times daily as needed (dairy consumption).     Marland Kitchen lidocaine (LIDODERM) 5 % Place 1 patch onto the skin daily as needed (pain). Remove & Discard patch within 12 hours or as directed by MD    . mirtazapine (REMERON SOL-TAB) 30 MG disintegrating tablet DISSOLVE 1 TABLET IN MOUTH EVERY NIGHT AT BEDTIME 30 tablet 5  . mometasone (NASONEX) 50 MCG/ACT nasal spray Place 2 sprays into the nose daily.    . mupirocin ointment (BACTROBAN) 2 % Place 1 application into the nose at bedtime. To prevent nose bleeds    . nystatin (MYCOSTATIN) 100000 UNIT/ML suspension Use as directed 5 mLs (500,000 Units total) in the mouth or throat at bedtime. Swish and spit    . PRESCRIPTION MEDICATION Place 2 puffs into both ears once a week. Place capsule in dispenser and instill 2 puffs in each ear once a week after washing hair. CS (X) AHCL    CHLORAMP/SULFU/AMPHO B/ H DROC 100-100-5 mg capsule - compounded at [place]    . Probiotic Product (ALIGN) 4 MG CAPS Take 4 mg by mouth every evening.     Marland Kitchen Propylene Glycol-Glycerin (SOOTHE OP) Place 1 drop into both eyes 2 (two) times daily as needed (dry eyes).     . ranitidine (ZANTAC) 300 MG tablet Take 1 tablet (300 mg total) by mouth every evening.    Marland Kitchen Respiratory Therapy Supplies (FLUTTER) DEVI Use as directed 1 each 0  . rivaroxaban (XARELTO) 20 MG TABS tablet Take 1 tablet (20 mg total) by mouth daily with supper. 90 tablet 3  . saccharomyces  boulardii (FLORASTOR) 250 MG capsule Take 1 capsule (250 mg total) by mouth 2 (two) times daily. 60 capsule 2  . silver sulfADIAZINE (SILVADENE) 1 % cream Apply 1 application topically daily. 50 g 0  . Spacer/Aero-Holding Chambers (AEROCHAMBER MV) inhaler Use as instructed 1 each 0  . Tiotropium Bromide Monohydrate (SPIRIVA RESPIMAT) 2.5 MCG/ACT AERS Inhale 2 puffs into the lungs daily. 1 Inhaler 5   No current facility-administered medications on file prior to visit.    Allergies  Allergen Reactions  . Dust Mite Extract Shortness Of Breath and Other (See Comments)    "sneezing" (02/20/2012)  . Molds &  Smuts Shortness Of Breath  . Morphine Sulfate Itching  . Other Shortness Of Breath and Other (See Comments)    Grass and weeds "sneezing; filled sinuses" (02/20/2012)  . Penicillins Rash and Other (See Comments)    "welts" (02/20/2012) Has patient had a PCN reaction causing immediate rash, facial/tongue/throat swelling, SOB or lightheadedness with hypotension: Unknown Has patient had a PCN reaction causing severe rash involving mucus membranes or skin necrosis: No Has patient had a PCN reaction that required hospitalization: No Has patient had a PCN reaction occurring within the last 10 years: No If all of the above answers are "NO", then may proceed with Cephalosporin use.   Marland Kitchen Reclast [Zoledronic Acid] Other (See Comments)    Fever, Put in hospital, dr said it was a reaction from a reaction   . Rofecoxib Swelling    Vioxx REACTION: feet swelling  . Shrimp Flavor Anaphylaxis    ALL SHELLFISH  . Tetracycline Hcl Nausea And Vomiting  . Xolair [Omalizumab] Other (See Comments)    Caused Blood clot  . Dilaudid [Hydromorphone Hcl] Itching  . Hydrocodone-Acetaminophen Nausea And Vomiting  . Levofloxacin Other (See Comments)    REACTION: GI upset  . Oxycodone Hcl Nausea And Vomiting  . Paroxetine Nausea And Vomiting    Paxil   . Celecoxib Swelling    Feet swelling  . Diltiazem Swelling   . Lactose Intolerance (Gi) Other (See Comments)    Bloating and gas  . Tree Extract Other (See Comments)    "tested and told I was allergic to it; never experienced a reaction to it" (02/20/2012)   Social History   Socioeconomic History  . Marital status: Divorced    Spouse name: Not on file  . Number of children: 2  . Years of education: college  . Highest education level: Not on file  Occupational History  . Occupation: Disabled    Comment: Retired Engineer, production: RETIRED  Social Needs  . Financial resource strain: Not on file  . Food insecurity:    Worry: Not on file    Inability: Not on file  . Transportation needs:    Medical: Not on file    Non-medical: Not on file  Tobacco Use  . Smoking status: Passive Smoke Exposure - Never Smoker  . Smokeless tobacco: Never Used  . Tobacco comment: Parents  Substance and Sexual Activity  . Alcohol use: Yes    Alcohol/week: 0.0 standard drinks    Comment: 02/20/2012 "couple glasses of wine/6 months"  . Drug use: No  . Sexual activity: Never  Lifestyle  . Physical activity:    Days per week: Not on file    Minutes per session: Not on file  . Stress: Not on file  Relationships  . Social connections:    Talks on phone: Not on file    Gets together: Not on file    Attends religious service: Not on file    Active member of club or organization: Not on file    Attends meetings of clubs or organizations: Not on file    Relationship status: Not on file  . Intimate partner violence:    Fear of current or ex partner: Not on file    Emotionally abused: Not on file    Physically abused: Not on file    Forced sexual activity: Not on file  Other Topics Concern  . Not on file  Social History Narrative   Patient lives at home alone.  Patient  divorced.    Patient has her BS degree.   Right handed.   Caffeine- sometimes coffee.      [facility]:   Born in [place], [place]. She worked as a Investment banker, operational. She has no pets currently. She does have indoor plants. Previously had mold in her home that was remediated. Carpet was removed.            Review of Systems  All other systems reviewed and are negative.      Objective:   Physical Exam Vitals signs reviewed.  Constitutional:      Appearance: She is well-developed. She is ill-appearing.  HENT:     Right Ear: Tympanic membrane and ear canal normal.     Left Ear: Tympanic membrane and ear canal normal.     Nose: No congestion or rhinorrhea.     Mouth/Throat:     Mouth: Mucous membranes are dry.  Cardiovascular:     Rate and Rhythm: Normal rate and regular rhythm.     Heart sounds: Normal heart sounds.  Pulmonary:     Effort: Pulmonary effort is normal. No respiratory distress.     Breath sounds: Decreased air movement present. No stridor. Examination of the right-lower field reveals rales. Examination of the left-lower field reveals rales. Decreased breath sounds and rales present. No wheezing.  Abdominal:     General: Bowel sounds are normal. There is no distension.     Palpations: Abdomen is soft.     Tenderness: There is no abdominal tenderness. There is no guarding.  Skin:    Findings: No erythema.  Neurological:     Mental Status: She is oriented to person, place, and time. She is lethargic.     Cranial Nerves: No cranial nerve deficit.     Sensory: No sensory deficit.     Motor: Weakness present.     Coordination: Coordination normal.           Assessment & Plan:  Fever, unknown origin  Lethargy  Dehydration  Has fever of unknown origin and is lethargic and appears dehydrated.  I am concerned about sepsis from an unknown source.  On her exam today with my limited resources in my office the only abnormality I can see or find is bibasilar crackles concerning for possible pneumonia however the patient is confused and is unable to answer questions appropriately.  She lives independently.  Therefore I do  not feel that she is stable to go home with antibiotics for presumed pneumonia.  I recommended emergency room evaluation for imaging immediately of the chest as well as lab work to evaluate for sources of sepsis.  Given her diarrhea and recent C. difficile colitis, I would also be concerned about recurrent C. difficile colitis.

## 2018-03-06 NOTE — H&P (Signed)
HPI  Ebony Scott BZJ:[id number] DOB: 29-Oct-1950 DOA: 03/06/2018  PCP: [name], MD   Chief Complaint: Malaise lethargy  HPI:   68 year old female Allergic rhinitis and allergies with asthma Bipolar Fibromyalgia Pseudogout Breast cancer in remission Bilateral DVTs Chronic low back and neck pain Reflux Graves' disease with hyperthyroidism and IBS She has been admitted to the hospital 01/75-10/2 with metabolic encephalopathy confusion found down on the floor and eventually found to have right lower extremity DVT as well as bilateral small PEs had some mild rhabdo and went to CIR  -At that time she had recently been taken off of methimazole and was also found to have left ankle pseudogout Return to hospital 11/26-12/5 after having contracted C. difficile colitis at rehab and finished an oral course of vancomycin-at that admission had a partial tear gluteus minimus minimus and was worked up for gout but had no other concerns was sent home on oral prednisone also had aerobe casted for bilateral fifth metatarsal close fractures Patient came back 12/23 through 12/25 and had a fever of 103 work-up was negative-at that time was started back on steroids  She comes back to the hospital today with overall generalized malaise and weakness as well as lethargy and malaise looking dehydrated Daughter is a Marine scientist and tells me that she has not been doing well but last Wednesday she was almost close to normal had been finishing off her prednisone taper and recently then developed a fever 103.4 some loose stools although they are more formed no cough no cold looks ill contacts etc.  ED Course: IV fluid bolus given, saline at 125 cc/h given, work-up was included lab work as below and CT scan was done  Review of Systems:  Slightly lethargic able to answer some questions but does not recall certain things Positive for fever,  Negative for visual changes, neck pain, headache, dark stool, starry  stool sore throat, rash, new muscle aches, chest pain, SOB, dysuria, bleeding, n/v/abdominal pain.  Past Medical History:  Diagnosis Date  . Allergic rhinitis   . Allergy    SEASONAL  . Anemia   . Anxiety    pt denies  . Arthritis    "mostly the hands" (02/20/2012)  . Asthma   . Breast cancer ([place]) 1998   in remission  . Cataract    REMOVED  . Chronic lower back pain   . Chronic neck pain   . Complication of anesthesia    "had hard time waking up from it several times" (02/20/2012)  . Depression    "some; don't take anything for it" (02/20/2012)  . Diverticulosis   . DVT (deep venous thrombosis) ([place])   . Exertional dyspnea   . Fibromyalgia 11/2011  . GERD (gastroesophageal reflux disease)   . Graves disease   . Headache(784.0)    "related to allergies; more at different times during the year" (02/20/2012)  . Hemorrhoids   . Hiatal hernia    back and neck  . Hx of adenomatous colonic polyps 04/12/2016  . Hypercholesteremia    good cholesterol is high  . Hypothyroidism   . IBS (irritable bowel syndrome)   . Moderate persistent asthma    -FeV1 72% 2011, -IgE 102 2011, CT sinus Neg 2011  . Osteoporosis    on reclast yearly  . Pneumonia 04/2011; ~ 11/2011   "double; single" (02/20/2012)    Past Surgical History:  Procedure Laterality Date  . ANTERIOR AND POSTERIOR REPAIR  1990's  . APPENDECTOMY    .  BREAST LUMPECTOMY  1998   left  . CARPOMETACARPEL ([place]) FUSION OF THUMB WITH AUTOGRAFT FROM RADIUS  ~ 2009   "both thumbs" (02/20/2012)  . CATARACT EXTRACTION W/ INTRAOCULAR LENS  IMPLANT, BILATERAL  2012  . CERVICAL DISCECTOMY  10/2001   C5-C6  . CERVICAL FUSION  2003   C3-C4  . CHOLECYSTECTOMY    . COLONOSCOPY    . DEBRIDEMENT TENNIS ELBOW  ?1970's   right  . ESOPHAGOGASTRODUODENOSCOPY    . HYSTERECTOMY    . KNEE ARTHROPLASTY  ?1990's   "?right; w/cartilage repair" (02/20/2012)  . NASAL SEPTUM SURGERY  1980's  . POSTERIOR CERVICAL FUSION/FORAMINOTOMY  2004   "failed  initial fusion; rewired  anterior neck" (02/20/2012)  . TONSILLECTOMY  ~ 1953  . VESICOVAGINAL FISTULA CLOSURE W/ TAH  1988  . VIDEO BRONCHOSCOPY Bilateral 08/23/2016   Procedure: VIDEO BRONCHOSCOPY WITH FLUORO;  Surgeon: Javier Glazier, MD;  Location: [facility];  Service: Cardiopulmonary;  Laterality: Bilateral;     reports that she is a non-smoker but has been exposed to tobacco smoke. She has never used smokeless tobacco. She reports previous alcohol use. She reports that she does not use drugs. Mobility: Baseline uses a walker to get around  Allergies  Allergen Reactions  . Dust Mite Extract Shortness Of Breath and Other (See Comments)    "sneezing" (02/20/2012)  . Molds & Smuts Shortness Of Breath  . Morphine Sulfate Itching  . Other Shortness Of Breath and Other (See Comments)    Grass and weeds "sneezing; filled sinuses" (02/20/2012)  . Penicillins Rash and Other (See Comments)    "welts" (02/20/2012) Has patient had a PCN reaction causing immediate rash, facial/tongue/throat swelling, SOB or lightheadedness with hypotension: Unknown Has patient had a PCN reaction causing severe rash involving mucus membranes or skin necrosis: No Has patient had a PCN reaction that required hospitalization: No Has patient had a PCN reaction occurring within the last 10 years: No If all of the above answers are "NO", then may proceed with Cephalosporin use.   Marland Kitchen Reclast [Zoledronic Acid] Other (See Comments)    Fever, Put in hospital, dr said it was a reaction from a reaction   . Rofecoxib Swelling    Vioxx REACTION: feet swelling  . Shrimp Flavor Anaphylaxis    ALL SHELLFISH  . Tetracycline Hcl Nausea And Vomiting  . Xolair [Omalizumab] Other (See Comments)    Caused Blood clot  . Dilaudid [Hydromorphone Hcl] Itching  . Hydrocodone-Acetaminophen Nausea And Vomiting  . Levofloxacin Other (See Comments)    REACTION: GI upset  . Oxycodone Hcl Nausea And Vomiting  . Paroxetine Nausea And  Vomiting    Paxil   . Celecoxib Swelling    Feet swelling  . Diltiazem Swelling  . Lactose Intolerance (Gi) Other (See Comments)    Bloating and gas  . Tree Extract Other (See Comments)    "tested and told I was allergic to it; never experienced a reaction to it" (02/20/2012)    Family History  Problem Relation Age of Onset  . Allergies Mother   . Heart disease Mother   . Arthritis Mother   . Lung cancer Mother   . Diabetes Mother   . Allergies Father   . Heart disease Father   . Arthritis Father   . Stroke Father   . Colon cancer Other        Maternal half aunt/Maternal half uncle  . Colitis Daughter   . Diabetes Maternal Grandfather  Prior to Admission medications   Medication Sig Start Date End Date Taking? Authorizing Provider  albuterol (PROAIR HFA) 108 (90 Base) MCG/ACT inhaler Inhale 2 puffs into the lungs every 4 (four) hours as needed for wheezing or shortness of breath. 02/18/17  Yes [name], MD  Alpha-D-Galactosidase (BEANO PO) Take 1 capsule by mouth 3 (three) times daily as needed (gas/ bloating).    Yes [provider]  Alpha-Lipoic Acid 600 MG CAPS Take 600 mg by mouth daily.    Yes [provider]  azelastine (ASTELIN) 0.1 % nasal spray USE 2 SPRAYS IN EACH NOSTRIL DAILY AS DIRECTED 07/16/17  Yes Kozlow, Donnamarie Poag, MD  CALCIUM-MAGNESIUM PO Take 1 tablet by mouth at bedtime.   Yes [provider]  cetirizine (ZYRTEC) 10 MG tablet Take 5 mg by mouth every evening.    Yes [provider]  cholecalciferol (VITAMIN D) 25 MCG (1000 UT) tablet Take 1,000 Units by mouth daily.    Yes [provider]  cyproheptadine (PERIACTIN) 4 MG tablet TAKE 2 TABLETS BY MOUTH EVERY EVENING Patient taking differently: TAKE 1 TABLET BY MOUTH EVERY EVENING 07/16/17  Yes Kozlow, Donnamarie Poag, MD  denosumab (PROLIA) 60 MG/ML SOLN injection Inject 60 mg into the skin every 6 (six) months. Administer in upper arm, thigh, or abdomen   Yes  [provider]  dexlansoprazole (DEXILANT) 60 MG capsule Take 1 capsule (60 mg total) by mouth daily. 07/16/17  Yes Kozlow, Donnamarie Poag, MD  diclofenac sodium (VOLTAREN) 1 % GEL Apply 2 g topically 2 (two) times daily. 01/23/18  Yes Aline August, MD  EPINEPHrine (EPIPEN 2-PAK) 0.3 mg/0.3 mL IJ SOAJ injection Inject 0.3 mLs (0.3 mg total) into the muscle Once PRN for up to 1 dose. 12/02/17  Yes Juanito Doom, MD  gabapentin (NEURONTIN) 600 MG tablet Take 600 mg by mouth at bedtime.   Yes [provider]  Hydrocortisone (GERHARDT'S BUTT CREAM) CREA Apply 1 application topically 4 (four) times daily. 02/11/18  Yes Georgette Shell, MD  Lactase (LACTAID FAST ACT) 9000 units TABS Take 18,000 Units by mouth 4 (four) times daily as needed (dairy consumption).    Yes [provider]  mirtazapine (REMERON SOL-TAB) 30 MG disintegrating tablet DISSOLVE 1 TABLET IN MOUTH EVERY NIGHT AT BEDTIME 01/10/18  Yes Gatha Mayer, MD  mupirocin ointment (BACTROBAN) 2 % Place 1 application into the nose at bedtime. To prevent nose bleeds 01/23/18  Yes Aline August, MD  nystatin (MYCOSTATIN) 100000 UNIT/ML suspension Use as directed 5 mLs (500,000 Units total) in the mouth or throat at bedtime. Swish and spit 01/23/18  Yes Aline August, MD  PRESCRIPTION MEDICATION Place 2 puffs into both ears once a week. Place capsule in dispenser and instill 2 puffs in each ear once a week after washing hair. CS (X) AHCL    CHLORAMP/SULFU/AMPHO B/ H DROC 100-100-5 mg capsule - compounded at [facility]   Yes [provider]  Propylene Glycol-Glycerin (SOOTHE OP) Place 1 drop into both eyes 2 (two) times daily as needed (dry eyes).    Yes [provider]  ranitidine (ZANTAC) 300 MG tablet Take 1 tablet (300 mg total) by mouth every evening. 01/23/18  Yes Aline August, MD  rivaroxaban (XARELTO) 20 MG TABS tablet Take 1 tablet (20 mg total) by mouth daily with supper. 02/07/18  Yes  [name], MD  saccharomyces boulardii (FLORASTOR) 250 MG capsule Take 1 capsule (250 mg total) by mouth 2 (two)  times daily. 01/24/18  Yes [name], MD  Spacer/Aero-Holding Chambers (AEROCHAMBER MV) inhaler Use as instructed 01/23/16  Yes Javier Glazier, MD  Tiotropium Bromide Monohydrate (SPIRIVA RESPIMAT) 2.5 MCG/ACT AERS Inhale 2 puffs into the lungs daily. 07/16/17  Yes Kozlow, Donnamarie Poag, MD  alum & mag hydroxide-simeth (MAALOX/MYLANTA) 200-200-20 MG/5ML suspension Take 15 mLs by mouth every 4 (four) hours as needed for indigestion or heartburn. 01/23/18   Aline August, MD  lidocaine (LIDODERM) 5 % Place 1 patch onto the skin daily as needed (pain). Remove & Discard patch within 12 hours or as directed by MD Patient not taking: Reported on 03/06/2018 01/23/18   Aline August, MD  mometasone (NASONEX) 50 MCG/ACT nasal spray Place 2 sprays into the nose daily. Patient not taking: Reported on 03/06/2018 01/23/18   Aline August, MD  Respiratory Therapy Supplies (FLUTTER) DEVI Use as directed 10/22/14   Elsie Stain, MD  silver sulfADIAZINE (SILVADENE) 1 % cream Apply 1 application topically daily. Patient not taking: Reported on 03/06/2018 02/07/18   [name], MD    Physical Exam:  Vitals:   03/06/18 1212 03/06/18 1352  BP: (!) 146/63 (!) 114/53  Pulse: (!) 106 (!) 103  Resp: (!) 23 19  Temp: 99 F (37.2 C) 100 F (37.8 C)  SpO2: 97% 99%     Cushingoid appearance no icterus no pallor throat is soft supple no meningismus  Chest is clear without added sound S1-S2 tachycardic 100 range no murmur rub or gallop  Abdomen is soft slightly tender in the right upper quadrant and epigastrium no rebound no guarding  Neurologically is intact with slightly brisk reflexes in the lower extremities she is unable to raise her left leg off the bed but with effort is able to dorsi and plantarflex  Skin no lower extremity edema affect is flat  I have personally reviewed  following labs and imaging studies  Labs:   Basic metabolic panel essentially normal albumin slightly low glucose 116 troponin 0 0.01 lactic acid 1.2 BNP 51  WBC 11.1 hemoglobin 9.8 platelet 436  CT head no acute abnormalities  Imaging studies:   As above  Medical tests:   EKG independently reviewed: Sinus tachycardia PR interval 0.12 QRS axis 50 degrees no ST-T wave changes  Test discussed with performing physician:  y   Decision to obtain old records:   y   Review and summation of old records:   y   Active Problems:   Hyperthyroidism   Hyperlipidemia   BREAST CANCER, HX OF   Acute deep vein thrombosis (DVT) of right lower extremity (HCC)   Fibromyalgia   Debility   Clostridium difficile diarrhea   Fever of unknown origin (FUO)   Clostridial gastroenteritis   Assessment/Plan  Fever of unknown origin Etiology is unclear at this time differentials include obviously infectious process versus autoimmune process-there is also question raised regarding pseudogout in the past that she was treated for all the colchicine makes her have more diarrhea and does not seem to help with the discomfort Of interest is the fact that she has had negative CT scan of chest and abdomen pelvis 12/23 so at this time I would not pursue the same I have discontinued C. difficile panel as I do not think she has an infection actively at this time Will get a CRP ESR as well as ANA and work-up for lupus, if she spikes a fever I will ask for blood cultures and urine cultures to be  drawn again, her chest x-ray is nondiagnostic of any type of infection-follow-up blood culture drawn in the emergency room I will ask infectious disease to evaluate the patient Generalized debility Also concerning is that she was taken off of methimazole prior to November admission so I will work-up and start with a free T4 free T3 thyrotropin receptor antibody-some of the findings are equivocal as she is hyperreflexic  in her lower extremities but she has a flat affect and is lethargic and sleepy and does appear to clinically act as if she is hypothyroid-before committing her to doses of thyroxine and or steroids I would get a free a.m. 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   Severity of Illness: The appropriate patient status for this patient is INPATIENT. Inpatient status is judged to be reasonable and necessary in order to provide the required intensity of service to ensure the patient's safety. The patient's presenting symptoms, physical exam findings, and initial radiographic and laboratory data in the context of their chronic comorbidities is felt to place them at high risk for further clinical deterioration. Furthermore, it is not anticipated that the patient will be medically stable for discharge from the hospital within 2 midnights of admission. The following factors support the patient status of inpatient.   " The patient's presenting symptoms include malaise lethargy and inability to walk and debility. " The worrisome physical exam findings include weakness as well as mild hyperreflexia. " The initial radiographic and laboratory data are worrisome because of unclear diagnosis. " The chronic co-morbidities include heart failure PE etc.   * I certify that at the point of admission it is my clinical judgment that the patient will require inpatient hospital care spanning beyond 2  midnights from the point of admission due to high intensity of service, high risk for further deterioration and high frequency of surveillance required.*     DVT prophylaxis: On apixaban Code Status: Full code Family Communication: Discussed with daughter at the bedside who is a nurse Consults called: Infectious disease  Time spent: 53 minutes  Tarae Wooden, MD  [facility] Direct contact: [phone] --[employer]  --[email or web address]; password [email or web address]  7PM-7AM contact night coverage as above  03/06/2018, 3:19 PM

## 2018-03-06 NOTE — ED Triage Notes (Signed)
Patient c/o weakness and SOB x 5 days. Patient went to her PCP today and was told to come to the ED for "Fluid on her lungs."

## 2018-03-07 ENCOUNTER — Inpatient Hospital Stay (HOSPITAL_COMMUNITY): Payer: Medicare Other

## 2018-03-07 DIAGNOSIS — R05 Cough: Secondary | ICD-10-CM | POA: Diagnosis not present

## 2018-03-07 DIAGNOSIS — R509 Fever, unspecified: Secondary | ICD-10-CM | POA: Diagnosis not present

## 2018-03-07 DIAGNOSIS — R531 Weakness: Secondary | ICD-10-CM | POA: Diagnosis not present

## 2018-03-07 DIAGNOSIS — E860 Dehydration: Secondary | ICD-10-CM | POA: Diagnosis not present

## 2018-03-07 LAB — COMPREHENSIVE METABOLIC PANEL
ALT: 17 U/L (ref 0–44)
AST: 22 U/L (ref 15–41)
Albumin: 2.5 g/dL — ABNORMAL LOW (ref 3.5–5.0)
Alkaline Phosphatase: 47 U/L (ref 38–126)
Anion gap: 9 (ref 5–15)
BUN: 16 mg/dL (ref 8–23)
CO2: 20 mmol/L — ABNORMAL LOW (ref 22–32)
Calcium: 7.8 mg/dL — ABNORMAL LOW (ref 8.9–10.3)
Chloride: 109 mmol/L (ref 98–111)
Creatinine, Ser: 0.88 mg/dL (ref 0.44–1.00)
GFR calc Af Amer: >60 mL/min (ref >60)
GFR calc non Af Amer: >60 mL/min (ref >60)
Glucose, Bld: 90 mg/dL (ref 70–99)
Potassium: 3.8 mmol/L (ref 3.5–5.1)
Sodium: 138 mmol/L (ref 135–145)
Total Bilirubin: 1.1 mg/dL (ref 0.3–1.2)
Total Protein: 6.1 g/dL — ABNORMAL LOW (ref 6.5–8.1)

## 2018-03-07 LAB — CBC
HCT: 26.1 % — ABNORMAL LOW (ref 36.0–46.0)
Hemoglobin: 7.9 g/dL — ABNORMAL LOW (ref 12.0–15.0)
MCH: 31.2 pg (ref 26.0–34.0)
MCHC: 30.3 g/dL (ref 30.0–36.0)
MCV: 103.2 fL — ABNORMAL HIGH (ref 80.0–100.0)
Platelets: 376 10*3/uL (ref 150–400)
RBC: 2.53 MIL/uL — ABNORMAL LOW (ref 3.87–5.11)
RDW: 19.7 % — ABNORMAL HIGH (ref 11.5–15.5)
WBC: 10.2 10*3/uL (ref 4.0–10.5)
nRBC: 0 % (ref 0.0–0.2)

## 2018-03-07 LAB — EXTRACTABLE NUCLEAR ANTIGEN ANTIBODY
ENA SM Ab Ser-aCnc: <0.2 AI (ref 0.0–0.9)
Ribonucleic Protein: <0.2 AI (ref 0.0–0.9)
SSA (Ro) (ENA) Antibody, IgG: <0.2 AI (ref 0.0–0.9)
SSB (La) (ENA) Antibody, IgG: <0.2 AI (ref 0.0–0.9)
Scleroderma (Scl-70) (ENA) Antibody, IgG: <0.2 AI (ref 0.0–0.9)
ds DNA Ab: <1 IU/mL (ref 0–9)

## 2018-03-07 LAB — T3, FREE: T3, Free: 2.4 pg/mL (ref 2.0–4.4)

## 2018-03-07 LAB — THYROTROPIN RECEPTOR AUTOABS: Thyrotropin Receptor Ab: <1.1 IU/L (ref 0.00–1.75)

## 2018-03-07 LAB — ANTI-JO 1 ANTIBODY, IGG: Anti JO-1: <0.2 AI (ref 0.0–0.9)

## 2018-03-07 LAB — PROTIME-INR
INR: 2.22
Prothrombin Time: 24.3 seconds — ABNORMAL HIGH (ref 11.4–15.2)

## 2018-03-07 MED ORDER — HYDROCORTISONE NA SUCCINATE PF 100 MG IJ SOLR
100.0000 mg | Freq: Three times a day (TID) | INTRAMUSCULAR | Status: DC
  Administered 2018-03-07 – 2018-03-08 (×4): 100 mg via INTRAVENOUS
  Filled 2018-03-07 (×3): qty 2

## 2018-03-07 MED ORDER — LIP MEDEX EX OINT
1.0000 "application " | TOPICAL_OINTMENT | CUTANEOUS | Status: DC | PRN
  Administered 2018-03-07: 1 via TOPICAL
  Filled 2018-03-07: qty 7

## 2018-03-07 MED ORDER — ADULT MULTIVITAMIN W/MINERALS CH
1.0000 | ORAL_TABLET | Freq: Every day | ORAL | Status: DC
  Administered 2018-03-07 – 2018-03-09 (×3): 1 via ORAL
  Filled 2018-03-07 (×3): qty 1

## 2018-03-07 MED ORDER — COSYNTROPIN 0.25 MG IJ SOLR
0.2500 mg | Freq: Once | INTRAMUSCULAR | Status: AC
  Administered 2018-03-07: 0.25 mg via INTRAVENOUS
  Filled 2018-03-07: qty 0.25

## 2018-03-07 MED ORDER — ENSURE ENLIVE PO LIQD
237.0000 mL | Freq: Three times a day (TID) | ORAL | Status: DC
  Administered 2018-03-07 (×3): 237 mL via ORAL

## 2018-03-07 MED ORDER — COSYNTROPIN 0.25 MG IJ SOLR
0.2500 mg | Freq: Once | INTRAMUSCULAR | Status: DC
  Filled 2018-03-07: qty 0.25

## 2018-03-07 MED ORDER — COSYNTROPIN 0.25 MG IJ SOLR
0.2500 mg | Freq: Once | INTRAMUSCULAR | Status: AC
  Filled 2018-03-07: qty 0.25

## 2018-03-07 MED ORDER — IBUPROFEN 200 MG PO TABS: 600.0000 mg | ORAL_TABLET | ORAL | Status: DC | PRN

## 2018-03-07 NOTE — Evaluation (Signed)
Physical Therapy Evaluation Patient Details Name: Ebony Scott MRN: [id number] DOB: September 19, 1950 Today's Date: 03/07/2018   History of Present Illness  68 yo female admitted to ED on 03/06/18 for ShOB and weakness x5 days. Pt with 3 hospitalizations from 12/13/17-02/12/18. Pt hospitalized on 3 different occasions for metabolic encephalopathy, DVTs/PEs, and C-diff after CIR stay. Other PMH includes anxiety, OA, breast cancer in remission, fibromyalgia, graves disease, IBS, OP, CKD III, CHF, cervical discectomy C5-C6 and cervical fusion C3-C4, R knee arthroplasty.   Clinical Impression   Pt presents with generalized pain, LE weakness, difficulty performing bed mobility tasks for pericare, and decreased activity tolerance. Pt to benefit from acute PT to address deficits. Pt only able to tolerate rolling bilaterally today, and deferred any further attempts at mobility. PT exam limited to bed. PT recommending SNF placement due to severity of pt's deficits and lack of social support per pt. Pt states "I can't (go to rehab). I don't have any money left for it". PT to progress mobility as tolerated, and will continue to follow acutely.      Follow Up Recommendations SNF    Equipment Recommendations  None recommended by PT    Recommendations for Other Services       Precautions / Restrictions Precautions Precautions: Fall Restrictions Weight Bearing Restrictions: No      Mobility  Bed Mobility Overal bed mobility: Needs Assistance Bed Mobility: Rolling Rolling: +2 for physical assistance;+2 for safety/equipment;Mod assist         General bed mobility comments: PT and NT assisting pt with pericare as pt had BM in bed. Mod assist +2 for rolling for sequencing assist and rolling from pelvis and shoulder girdle. Pt with some initiation of movement.   Transfers Overall transfer level: (Pt deferred any further mobility due to fatigue, pain, and weakness)                   Ambulation/Gait                Stairs            Wheelchair Mobility    Modified Rankin (Stroke Patients Only)       Balance Overall balance assessment: History of Falls(Not assessed, pt unable to tolerate supine to sit this session )                                           Pertinent Vitals/Pain Pain Assessment: Faces Faces Pain Scale: Hurts whole lot Pain Location: generalized, with bed mobility  Pain Descriptors / Indicators: Discomfort;Moaning;Grimacing;Guarding Pain Intervention(s): Limited activity within patient's tolerance;Repositioned;Monitored during session    Home Living Family/patient expects to be discharged to:: Private residence Living Arrangements: Alone Available Help at Discharge: Other (Comment)(Pt states "no one can help me" ) Type of Home: House Home Access: Stairs to enter Entrance Stairs-Rails: Left Entrance Stairs-Number of Steps: 4(Although, per last hospitalization notes, pt with 14 steps to get in) Home Layout: Two level Home Equipment: Walker - 2 wheels;Bedside commode;Shower seat - built in;Grab bars - tub/shower(equipment that pt has informed from previous hospital note. )      Prior Function Level of Independence: Needs assistance   Gait / Transfers Assistance Needed: Pt states she used RW for mobility PTA  ADL's / Homemaking Assistance Needed: Pt unable to articulate what she needed assistance with, but she reports needing assist at home  Hand Dominance   Dominant Hand: Right    Extremity/Trunk Assessment   Upper Extremity Assessment Upper Extremity Assessment: Generalized weakness    Lower Extremity Assessment Lower Extremity Assessment: RLE deficits/detail;LLE deficits/detail RLE Deficits / Details: <3/5 strength hip flexors/extensors, knee flexors/extensors, observed from heel slides and hip flexion and extension performed in supine positioning. Pt required assist to complete  movement. Pt unable to tolerate MMT formal testing.  RLE: Unable to fully assess due to pain LLE Deficits / Details: see above  LLE: Unable to fully assess due to pain       Communication   Communication: No difficulties  Cognition Arousal/Alertness: Lethargic Behavior During Therapy: Flat affect Overall Cognitive Status: No family/caregiver present to determine baseline cognitive functioning                                 General Comments: Pt with increased processing time, disoriented to place and situation.       General Comments      Exercises     Assessment/Plan    PT Assessment Patient needs continued PT services  PT Problem List Decreased strength;Pain;Decreased cognition;Decreased activity tolerance;Decreased knowledge of use of DME;Decreased balance;Decreased safety awareness;Decreased mobility       PT Treatment Interventions DME instruction;Therapeutic activities;Gait training;Patient/family education;Therapeutic exercise;Balance training;Functional mobility training    PT Goals (Current goals can be found in the Care Plan section)  Acute Rehab PT Goals Patient Stated Goal: go home  PT Goal Formulation: With patient Time For Goal Achievement: 03/21/18 Potential to Achieve Goals: Fair    Frequency Min 2X/week   Barriers to discharge        Co-evaluation               AM-PAC PT "6 Clicks" Mobility  Outcome Measure Help needed turning from your back to your side while in a flat bed without using bedrails?: Total Help needed moving from lying on your back to sitting on the side of a flat bed without using bedrails?: Total Help needed moving to and from a bed to a chair (including a wheelchair)?: Total Help needed standing up from a chair using your arms (e.g., wheelchair or bedside chair)?: Total Help needed to walk in hospital room?: Total Help needed climbing 3-5 steps with a railing? : Total 6 Click Score: 6    End of Session  Equipment Utilized During Treatment: Gait belt Activity Tolerance: Patient limited by fatigue;Patient limited by pain Patient left: in bed;with nursing/sitter in room;with call bell/phone within reach(NT to turn pt's bed alarm on and lower bed; NT assessing vitals upon PT exit) Nurse Communication: Mobility status PT Visit Diagnosis: Muscle weakness (generalized) (M62.81);Other abnormalities of gait and mobility (R26.89)    Time: 1336-1400 PT Time Calculation (min) (ACUTE ONLY): 24 min   Charges:   PT Evaluation $PT Eval Low Complexity: 1 Low          Rashunda Passon Conception Chancy, PT Acute Rehabilitation Services Pager [phone]  Office [phone]   Sabriyah Wilcher D Elonda Husky 03/07/2018, 3:17 PM

## 2018-03-07 NOTE — Progress Notes (Signed)
[facility] PROGRESS NOTE  Ebony Scott [id number]:[id number] DOB: 01-30-1951 DOA: 03/06/2018 PCP: [name], MD   Narrative: 68 year old female Allergic rhinitis and allergies with asthma Bipolar Fibromyalgia Pseudogout Breast cancer in remission Bilateral DVTs Chronic low back and neck pain Reflux Graves' disease with hyperthyroidism and IBS She has been admitted to the hospital 36/64-40/3 with metabolic encephalopathy confusion found down on the floor and eventually found to have right lower extremity DVT as well as bilateral small PEs had some mild rhabdo and went to CIR             -At that time she had recently been taken off of methimazole and was also found to have left ankle pseudogout Return to hospital 11/26-12/5 after having contracted C. difficile colitis at rehab and finished an oral course of vancomycin-at that admission had a partial tear gluteus minimus minimus and was worked up for gout but had no other concerns was sent home on oral prednisone also had aerobe casted for bilateral fifth metatarsal close fractures Patient came back 12/23 through 12/25 and had a fever of 103 work-up was negative-at that time was started back on steroids  She comes back to the hospital today with overall generalized malaise and weakness as well as lethargy and malaise looking dehydrated Daughter is a [employer] scientist and tells me that she has not been doing well but last Wednesday she was almost close to normal had been finishing off her prednisone taper and recently then developed a fever 103.4 some loose stools although they are more formed no cough no cold looks ill contacts etc.   A & Plan Fever unknown origin-T-max 102 degrees Fever autoimmune >oncological or infectious process Anti-RO anti-SCL 70 anti-smooth muscle antibody anti-La double-stranded DNA are all negative  At this juncture I feel it is reasonable to trial relatively high-dose stress dose steroids to see if this makes a  difference and then taper to prednisone Difficult disposition and planning as we do not have a nonstaff rheumatologist in the hospital We will have to see how she does in the next 1 to 2 days and it is hopeful that she resolves I had a long discussion with her daughter at the bedside who is a nurse at this facility and she understands the planning and rationale behind her thinking For completion sake I ordered repeat blood cultures x2 which are pending cultures from 1/16 are negative so far Continue saline 50 cc/h for now Get crp and esr Left wrist pain?  Gout/pseudogout This is new since 1/17, I do not see a area that can be drained so I will empirically start steroids as above we will get a uric acid level in the morning Prior history of Graves' disease previously on methimazole in October 2019 Thyroid function tests including T4 TSH ferritin and thyrotropin receptor autoantibodies are normal ?  Adrenal insufficiency Her free a.m. cortisol is 10 which is in the normal range so I will order an ACTH stim test although this might be clouded by her recent started on steroids Breast cancer in remission She had a CT scan of the chest that was done recently that did not show any findings suggestive of recurrence and the likely cause for fever Dilutional anemia Hemoglobin dropped from the 9 range to 7.9 range, she was placed on fluids previously and there is no report of bleeding so we will monitor and check again in the morning Moderate malnutrition BMI 22 Has not been eating well we will obtain  nutritionist input  Lovenox, discussed with daughter and patient at the bedside in detail on rounds Full code Inpatient   [place], MD  [facility] [place] [email or web address] 7PM-7AM contact night coverage as above 03/07/2018, 2:52 PM  LOS: 1 day   Consultants:  Telephone consulted Dr. area but no return call back yet  Procedures:  No  Antimicrobials:  No  Interval  history/Subjective: Continues to be tired and listless fever up to 102 no localizing source but does have pain in the left wrist that was not present previously No cough no cold no burning in the urine  Objective:  Vitals:  Vitals:   03/07/18 1149 03/07/18 1427  BP:  137/67  Pulse:  96  Resp:  18  Temp: (!) 101.6 F (38.7 C) 99.6 F (37.6 C)  SpO2:  99%    Exam:  Listless tired appearing Caucasian female Chest is clear S1-S2 no murmur rub or gallop Abdomen is soft nontender no rebound No lower extremity edema range of motion intact but she is weak.  Although power grossly is 5/5   I have personally reviewed the following:  DATA   Labs:  As above  Imaging studies:  No   Scheduled Meds: . feeding supplement (ENSURE ENLIVE)  237 mL Oral TID BM  . hydrocortisone sod succinate (SOLU-CORTEF) inj  100 mg Intravenous Q8H  . loratadine  10 mg Oral Daily  . pantoprazole  40 mg Oral Daily  . rivaroxaban  20 mg Oral Q supper  . umeclidinium bromide  1 puff Inhalation Daily   Continuous Infusions: . sodium chloride 50 mL/hr at 03/07/18 1335    Active Problems:   Hyperthyroidism   Hyperlipidemia   BREAST CANCER, HX OF   Acute deep vein thrombosis (DVT) of right lower extremity (HCC)   Fibromyalgia   Debility   Clostridium difficile diarrhea   Fever of unknown origin (FUO)   Clostridial gastroenteritis   LOS: 1 day

## 2018-03-07 NOTE — Progress Notes (Signed)
Provider Blount notified, was assisting client on bed pan and completing assessment, when pt. Reported dizziness in which she stated she had three days ago, at that time there was rapid eye movement from left to right. PERRLA, A & O x4, follows command, VSS. Made provider aware due to finding. No s/s of acute distress. Will continue to monitor pt. Closely. Neomia Dear, RN

## 2018-03-07 NOTE — Progress Notes (Signed)
Initial Nutrition Assessment  DOCUMENTATION CODES:   Non-severe (moderate) malnutrition in context of chronic illness  INTERVENTION:    Ensure Enlive po BID, each supplement provides 350 kcal and 20 grams of protein  Magic cup BID with meals, each supplement provides 290 kcal and 9 grams of protein  MVI daily  NUTRITION DIAGNOSIS:   Moderate Malnutrition related to chronic illness(Graves disease ) as evidenced by mild fat depletion, moderate muscle depletion, percent weight loss.  GOAL:   Patient will meet greater than or equal to 90% of their needs  MONITOR:   PO intake, Supplement acceptance, Weight trends, Labs  REASON FOR ASSESSMENT:   Malnutrition Screening Tool    ASSESSMENT:   Patient with PMH significant for depression, diverticulosis, GERD, Graves disease, adenomatous colonic polyps, IBS, and bipolar disorder. Recently admitted 11/5 for bilateral PE and acute lower extremity DVT. Presents this admission fever of unknown origin.    Spoke with daughter at bedside. Reports pt's appetite has been poor over the last week due to ongoing nausea. States pt has a caregiver that comes twice daily and usually fixes hotdogs or sandwiches. Intake of these items have decreased daily over the last 6-7 days. Pt had a couple bites of an egg biscuit this am. Meal completions charted as 0-50% for pt's last three meals. Pt amendable to Ensure/Magic Cups.   Daughter unsure of how much wt pt has lost or UBW. Records indicate pt weighed 133 lb on 12/14/17 and 116 lb this admission (12.8% wt loss in 4 months, significant for time frame). Nutrition-Focused physical exam completed.   Medications reviewed and include: solucortef, NS @ 50 ml/hr Labs reviewed.   NUTRITION - FOCUSED PHYSICAL EXAM:    Most Recent Value  Orbital Region  No depletion  Upper Arm Region  Moderate depletion  Thoracic and Lumbar Region  Unable to assess  Buccal Region  Mild depletion  Temple Region  Mild  depletion  Clavicle Bone Region  Moderate depletion  Clavicle and Acromion Bone Region  Moderate depletion  Scapular Bone Region  Unable to assess  Dorsal Hand  Mild depletion  Patellar Region  Moderate depletion  Anterior Thigh Region  Moderate depletion  Posterior Calf Region  Moderate depletion  Edema (RD Assessment)  Mild     Diet Order:   Diet Order            Diet regular Room service appropriate? Yes; Fluid consistency: Thin  Diet effective now              EDUCATION NEEDS:   Not appropriate for education at this time  Skin:  Skin Assessment: Reviewed RN Assessment  Last BM:  1/15  Height:   Ht Readings from Last 1 Encounters:  03/06/18 5\' 1"  (1.549 m)    Weight:   Wt Readings from Last 1 Encounters:  03/06/18 52.9 kg    Ideal Body Weight:  47.7 kg  BMI:  Body mass index is 22.04 kg/m.  Estimated Nutritional Needs:   Kcal:  1600-1800 kcal  Protein:  80-95 grams  Fluid:  >/= 1.6 L/day   Mariana Single RD, LDN Clinical Nutrition Pager # - [phone]

## 2018-03-08 DIAGNOSIS — R05 Cough: Secondary | ICD-10-CM | POA: Diagnosis not present

## 2018-03-08 DIAGNOSIS — R509 Fever, unspecified: Secondary | ICD-10-CM | POA: Diagnosis not present

## 2018-03-08 DIAGNOSIS — R531 Weakness: Secondary | ICD-10-CM | POA: Diagnosis not present

## 2018-03-08 DIAGNOSIS — E860 Dehydration: Secondary | ICD-10-CM | POA: Diagnosis not present

## 2018-03-08 LAB — URIC ACID: Uric Acid, Serum: 4.2 mg/dL (ref 2.5–7.1)

## 2018-03-08 LAB — CBC WITH DIFFERENTIAL/PLATELET
Abs Immature Granulocytes: 0.25 10*3/uL — ABNORMAL HIGH (ref 0.00–0.07)
Basophils Absolute: 0 10*3/uL (ref 0.0–0.1)
Basophils Relative: 0 %
Eosinophils Absolute: 0 10*3/uL (ref 0.0–0.5)
Eosinophils Relative: 0 %
HCT: 26.6 % — ABNORMAL LOW (ref 36.0–46.0)
Hemoglobin: 8.1 g/dL — ABNORMAL LOW (ref 12.0–15.0)
Immature Granulocytes: 2 %
Lymphocytes Relative: 6 %
Lymphs Abs: 0.7 10*3/uL (ref 0.7–4.0)
MCH: 30.5 pg (ref 26.0–34.0)
MCHC: 30.5 g/dL (ref 30.0–36.0)
MCV: 100 fL (ref 80.0–100.0)
Monocytes Absolute: 0.5 10*3/uL (ref 0.1–1.0)
Monocytes Relative: 4 %
Neutro Abs: 10.8 10*3/uL — ABNORMAL HIGH (ref 1.7–7.7)
Neutrophils Relative %: 88 %
Platelets: 421 10*3/uL — ABNORMAL HIGH (ref 150–400)
RBC: 2.66 MIL/uL — ABNORMAL LOW (ref 3.87–5.11)
RDW: 18.9 % — ABNORMAL HIGH (ref 11.5–15.5)
WBC: 12.4 10*3/uL — ABNORMAL HIGH (ref 4.0–10.5)
nRBC: 0 % (ref 0.0–0.2)

## 2018-03-08 LAB — C-REACTIVE PROTEIN: CRP: 17.1 mg/dL — ABNORMAL HIGH (ref <1.0)

## 2018-03-08 LAB — SEDIMENTATION RATE: Sed Rate: >140 mm/hr — ABNORMAL HIGH (ref 0–22)

## 2018-03-08 MED ORDER — HYDROCORTISONE NA SUCCINATE PF 100 MG IJ SOLR
50.0000 mg | Freq: Two times a day (BID) | INTRAMUSCULAR | Status: DC
  Administered 2018-03-08 – 2018-03-09 (×2): 50 mg via INTRAVENOUS
  Filled 2018-03-08 (×2): qty 2

## 2018-03-08 MED ORDER — GLUCERNA SHAKE PO LIQD
237.0000 mL | Freq: Three times a day (TID) | ORAL | Status: DC
  Administered 2018-03-09: 237 mL via ORAL
  Filled 2018-03-08 (×4): qty 237

## 2018-03-08 MED ORDER — GLUCERNA 1.2 CAL PO LIQD: 237.0000 mL | ORAL | Status: DC

## 2018-03-08 NOTE — Progress Notes (Signed)
[facility] PROGRESS NOTE  Ebony Scott [id number] DOB: [birth year] DOA: 03/06/2018 PCP: [name], MD   Narrative: 68 year old female Allergic rhinitis and allergies with asthma Bipolar Fibromyalgia Pseudogout Breast cancer in remission Bilateral DVTs Chronic low back and neck pain Reflux Graves' disease with hyperthyroidism and IBS She has been admitted to the hospital 00/17-49/4 with metabolic encephalopathy confusion found down on the floor and eventually found to have right lower extremity DVT as well as bilateral small PEs had some mild rhabdo and went to CIR             -At that time she had recently been taken off of methimazole and was also found to have left ankle pseudogout Return to hospital 11/26-12/5 after having contracted C. difficile colitis at rehab and finished an oral course of vancomycin-at that admission had a partial tear gluteus minimus minimus and was worked up for gout but had no other concerns was sent home on oral prednisone also had aerobe casted for bilateral fifth metatarsal close fractures Patient came back 12/23 through 12/25 and had a fever of 103 work-up was negative-at that time was started back on steroids  She comes back to the hospital today with overall generalized malaise and weakness as well as lethargy and malaise looking dehydrated Daughter is a [employer] scientist and tells me that she has not been doing well but last Wednesday she was almost close to normal had been finishing off her prednisone taper and recently then developed a fever 103.4 some loose stools although they are more formed no cough no cold looks ill contacts etc.   A & Plan Fever unknown origin-T-max 102 degrees Fever autoimmune >oncological or infectious process Anti-RO anti-SCL 70 anti-smooth muscle antibody anti-La double-stranded DNA are all negative  Difficult disposition and planning as we do not have a nonstaff rheumatologist in the hospital CRP ESR elevated Cut back  Solu-Cortef 100 3 times daily to 50 ii/d and then prednisone tomorrow  Discussed with Dr. Kathlene November of rheumatology-consideration for PMR and need for temporal artery biopsy versus arthritide is highest on the list otherwise would consider bone marrow biopsy Left wrist pain?  Gout/pseudogout This is new since 1/17, and has improved significantly with steroid initiation Prior history of Graves' disease previously on methimazole in October 2019 Thyroid function tests including T4 TSH ferritin and thyrotropin receptor autoantibodies are normal ?  Adrenal insufficiency Result of cosyntropin test continue.  Steroids to continue for now Breast cancer in remission She had a CT scan of the chest that was done recently that did not show any findings suggestive of recurrence and the likely cause for fever no further work-up from this perspective at this time Dilutional anemia Hemoglobin stable without reported bleeding Prior bilateral DVTs Continue Xarelto Moderate malnutrition BMI 22 Has not been eating well we will obtain nutritionist input  Lovenox,  Full code Inpatient   [place], MD  [facility] [place] [email or web address] 7PM-7AM contact night coverage as above 03/08/2018, 3:56 PM  LOS: 2 days   Consultants:  Telephone consulted Dr. Sharol Given and discussed with him 1/19  Procedures:  No  Antimicrobials:  No  Interval history/Subjective:  Doing much better more awake coherent cognizant Does not remember me from prior exam Pain in wrist is better although some dizziness overnight No chest pain No fever overnight  Objective:  Vitals:  Vitals:   03/08/18 1156 03/08/18 1207  BP: 123/64   Pulse: 78   Resp: 16   Temp: 98.1 F (  36.7 C)   SpO2: 96% 96%    Exam:  More awake coherent Chest clear No thyromegaly no submandibular lymphadenopathy S1-S2 no murmur Left wrist less swollen still somewhat tender right wrist also tender Neurologically intact power 5/5 no  nystagmus  I have personally reviewed the following:  DATA   Labs:  As above  Imaging studies:  No   Scheduled Meds: . feeding supplement (ENSURE ENLIVE)  237 mL Oral TID BM  . hydrocortisone sod succinate (SOLU-CORTEF) inj  100 mg Intravenous Q8H  . loratadine  10 mg Oral Daily  . multivitamin with minerals  1 tablet Oral Daily  . pantoprazole  40 mg Oral Daily  . rivaroxaban  20 mg Oral Q supper  . umeclidinium bromide  1 puff Inhalation Daily   Continuous Infusions: . sodium chloride 50 mL/hr at 03/08/18 1414  . feeding supplement (GLUCERNA 1.2 CAL)      Active Problems:   Hyperthyroidism   Hyperlipidemia   BREAST CANCER, HX OF   Acute deep vein thrombosis (DVT) of right lower extremity (HCC)   Fibromyalgia   Debility   Clostridium difficile diarrhea   Fever of unknown origin (FUO)   Clostridial gastroenteritis   LOS: 2 days

## 2018-03-09 DIAGNOSIS — R531 Weakness: Secondary | ICD-10-CM | POA: Diagnosis not present

## 2018-03-09 MED ORDER — PREDNISONE 20 MG PO TABS: ORAL_TABLET | ORAL | 0 refills | Status: AC

## 2018-03-09 MED ORDER — PREDNISONE 20 MG PO TABS
40.0000 mg | ORAL_TABLET | Freq: Every day | ORAL | Status: DC
  Administered 2018-03-09: 40 mg via ORAL
  Filled 2018-03-09: qty 2

## 2018-03-09 NOTE — Discharge Summary (Signed)
Physician Discharge Summary  Ebony Scott MOQ:[id number] DOB: [birth year] DOA: 03/06/2018  PCP: [name], MD  Admit date: 03/06/2018 Discharge date: 03/09/2018  Time spent: 25 minutes  Recommendations for Outpatient Follow-up:  1. Will need outpatient evaluation by rheumatologist Dr. Lahoma Rocker who has contacted and left a message for and given patient demographics to 2. Will need prednisone short-term until definitive diagnosis has been ascertained recommend outpatient labs Chem-12 and CBC probably about 1 to 2 weeks 3. Please follow-up on results of cosyntropin test that were pending at time of discharge  Discharge Diagnoses:  Active Problems:   Hyperthyroidism   Hyperlipidemia   BREAST CANCER, HX OF   Acute deep vein thrombosis (DVT) of right lower extremity (HCC)   Fibromyalgia   Debility   Clostridium difficile diarrhea   Fever of unknown origin (FUO)   Clostridial gastroenteritis   Discharge Condition: Improved  Diet recommendation: Regular  Filed Weights   03/06/18 1012 03/06/18 1637  Weight: 57.2 kg 52.9 kg    History of present illness:  68 year old female Allergic rhinitis and allergies with asthma Bipolar Fibromyalgia Pseudogout Breast cancer in remission Bilateral DVTs Chronic low back and neck pain Reflux Graves' disease with hyperthyroidism and IBS She has been admitted to the [id number] with metabolic encephalopathy confusion found down on the floor and eventually found to have right lower extremity DVT as well as bilateral small PEs had some mild rhabdo and went to CIR -At that time she had recently been taken off of methimazole and was also found to have left ankle pseudogout Return to hospital11/26-12/5after having contracted C. difficile colitis at rehab and finished an oral course of vancomycin-at that admission had a partial tear gluteus minimus minimus and was worked up for gout but had no other concerns was sent home  on oral prednisone also had aerobe casted for bilateral fifth metatarsal close fractures Patient came back 12/23 through 12/25 and had a fever of 103 work-up was negative-at that time was started back on steroids  She comes back to the hospital today with overall generalized malaise and weakness as well as lethargy and malaise looking dehydrated Daughter is a Marine scientist and tells me that she has not been doing well but last Wednesday she was almost close to normal had been finishing off her prednisone taper and recently then developed a fever 103.4 some loose stools although they are more formed no cough no cold looks ill contacts etc.  Hospital Course:  Fever unknown origin-T-max 102 degrees Fever autoimmune >oncological or infectious process-resolved during hospitalization Anti-RO anti-SCL 70 anti-smooth muscle antibody anti-La double-stranded DNA are all negative  CRP ESR elevated significantly Cut back Solu-Cortef 100 3 times daily to 50 ii/d  and discharged on prednisone 40 mg for 2 weeks tapering to 20 mg subsequently and can be adjusted as below Discussed with Dr. Kathlene November of rheumatology-consideration for PMR and need for temporal artery biopsy versus arthritide is highest on the list otherwise would consider bone marrow biopsy Left wrist pain?  Gout/pseudogout This is new since 1/17, and has improved significantly with steroid initiation Prior history of Graves' disease previously on methimazole in October 2019 Thyroid function tests including T4 TSH ferritin and thyrotropin receptor autoantibodies are normal and does not need to have further checks done other than yearly ?  Adrenal insufficiency Result of cosyntropin test still is pending and will need to be followed up as an outpatient.  Steroids to continue for now Patient is concerned about bone loss and  fractures and I have mentioned to her that this time I would not consider other disease modifying agents because of unclear and uncertain  diagnosis and rheumatology will make that decision when she is seen Breast cancer in remission She had a CT scan of the chest that was done recently that did not show any findings suggestive of recurrence and the likely cause for fever no further work-up from this perspective at this time Dilutional anemia Hemoglobin stable without reported bleeding Prior bilateral DVTs Continue Xarelto Moderate malnutrition BMI 22 Has not been eating well and was placed on supplements in hospital stay  Consultations:  Telephone consulted Dr. Kathlene November of rheumatology who will coordinate outpatient follow-up  Discharge Exam: Vitals:   03/09/18 0408 03/09/18 0635  BP: 140/90   Pulse: 75   Resp: 14   Temp: 97.7 F (36.5 C)   SpO2: 98% 97%    General: EOMI NCAT pleasant face slightly cushingoid much more alert coherent cognizant No fevers no chills Eating and drinking well without in the hallway walking around without any weakness Cardiovascular: S1-S2 no murmur rub or gallop Respiratory: Clinically clear no added sound Abdomen soft nontender no rebound no guarding  Discharge Instructions   Discharge Instructions    Diet - low sodium heart healthy   Complete by:  As directed    Discharge instructions   Complete by:  As directed    Please take steroids as a a taper as directed You have 1 lab that is pending at this time I will call Dr. Kathlene November and help coordinate an appointment with him within 1 week Please do no hesitate to call Dr. Cindi Carbon if there are further concerns   Increase activity slowly   Complete by:  As directed      Allergies as of 03/09/2018      Reactions   Dust Mite Extract Shortness Of Breath, Other (See Comments)   "sneezing" (02/20/2012)   Molds & Smuts Shortness Of Breath   Morphine Sulfate Itching   Other Shortness Of Breath, Other (See Comments)   Grass and weeds "sneezing; filled sinuses" (02/20/2012)   Penicillins Rash, Other (See Comments)   "welts" (02/20/2012) Has  patient had a PCN reaction causing immediate rash, facial/tongue/throat swelling, SOB or lightheadedness with hypotension: Unknown Has patient had a PCN reaction causing severe rash involving mucus membranes or skin necrosis: No Has patient had a PCN reaction that required hospitalization: No Has patient had a PCN reaction occurring within the last 10 years: No If all of the above answers are "NO", then may proceed with Cephalosporin use.   Reclast [zoledronic Acid] Other (See Comments)   Fever, Put in hospital, dr said it was a reaction from a reaction    Rofecoxib Swelling   Vioxx REACTION: feet swelling   Shrimp Flavor Anaphylaxis   ALL SHELLFISH   Tetracycline Hcl Nausea And Vomiting   Xolair [omalizumab] Other (See Comments)   Caused Blood clot   Dilaudid [hydromorphone Hcl] Itching   Hydrocodone-acetaminophen Nausea And Vomiting   Levofloxacin Other (See Comments)   REACTION: GI upset   Oxycodone Hcl Nausea And Vomiting   Paroxetine Nausea And Vomiting   Paxil   Celecoxib Swelling   Feet swelling   Diltiazem Swelling   Lactose Intolerance (gi) Other (See Comments)   Bloating and gas   Tree Extract Other (See Comments)   "tested and told I was allergic to it; never experienced a reaction to it" (02/20/2012)      Medication List  STOP taking these medications   cyproheptadine 4 MG tablet Commonly known as:  PERIACTIN   mirtazapine 30 MG disintegrating tablet Commonly known as:  REMERON SOL-TAB   mometasone 50 MCG/ACT nasal spray Commonly known as:  NASONEX   nystatin 100000 UNIT/ML suspension Commonly known as:  MYCOSTATIN   ranitidine 300 MG tablet Commonly known as:  ZANTAC     TAKE these medications   AEROCHAMBER MV inhaler Use as instructed   albuterol 108 (90 Base) MCG/ACT inhaler Commonly known as:  PROAIR HFA Inhale 2 puffs into the lungs every 4 (four) hours as needed for wheezing or shortness of breath.   Alpha-Lipoic Acid 600 MG Caps Take 600  mg by mouth daily.   alum & mag hydroxide-simeth 200-200-20 MG/5ML suspension Commonly known as:  MAALOX/MYLANTA Take 15 mLs by mouth every 4 (four) hours as needed for indigestion or heartburn.   azelastine 0.1 % nasal spray Commonly known as:  ASTELIN USE 2 SPRAYS IN EACH NOSTRIL DAILY AS DIRECTED   BEANO PO Take 1 capsule by mouth 3 (three) times daily as needed (gas/ bloating).   CALCIUM-MAGNESIUM PO Take 1 tablet by mouth at bedtime.   cetirizine 10 MG tablet Commonly known as:  ZYRTEC Take 5 mg by mouth every evening.   cholecalciferol 25 MCG (1000 UT) tablet Commonly known as:  VITAMIN D Take 1,000 Units by mouth daily.   denosumab 60 MG/ML Soln injection Commonly known as:  PROLIA Inject 60 mg into the skin every 6 (six) months. Administer in upper arm, thigh, or abdomen   dexlansoprazole 60 MG capsule Commonly known as:  DEXILANT Take 1 capsule (60 mg total) by mouth daily.   diclofenac sodium 1 % Gel Commonly known as:  VOLTAREN Apply 2 g topically 2 (two) times daily.   EPINEPHrine 0.3 mg/0.3 mL Soaj injection Commonly known as:  EPIPEN 2-PAK Inject 0.3 mLs (0.3 mg total) into the muscle Once PRN for up to 1 dose.   FLUTTER Devi Use as directed   gabapentin 600 MG tablet Commonly known as:  NEURONTIN Take 600 mg by mouth at bedtime.   Gerhardt's butt cream Crea Apply 1 application topically 4 (four) times daily.   LACTAID FAST ACT 9000 units Tabs Generic drug:  Lactase Take 18,000 Units by mouth 4 (four) times daily as needed (dairy consumption).   lidocaine 5 % Commonly known as:  LIDODERM Place 1 patch onto the skin daily as needed (pain). Remove & Discard patch within 12 hours or as directed by MD   mupirocin ointment 2 % Commonly known as:  BACTROBAN Place 1 application into the nose at bedtime. To prevent nose bleeds   predniSONE 20 MG tablet Commonly known as:  DELTASONE Take 2 tablets (40 mg total) by mouth daily before breakfast for  14 days, THEN 1 tablet (20 mg total) daily before breakfast for 14 days. Start taking on:  March 10, 2018   PRESCRIPTION MEDICATION Place 2 puffs into both ears once a week. Place capsule in dispenser and instill 2 puffs in each ear once a week after washing hair. CS (X) AHCL    CHLORAMP/SULFU/AMPHO B/ H DROC 100-100-5 mg capsule - compounded at [facility]   rivaroxaban 20 MG Tabs tablet Commonly known as:  XARELTO Take 1 tablet (20 mg total) by mouth daily with supper.   saccharomyces boulardii 250 MG capsule Commonly known as:  FLORASTOR Take 1 capsule (250 mg total) by mouth 2 (two) times daily.   silver  sulfADIAZINE 1 % cream Commonly known as:  SILVADENE Apply 1 application topically daily.   SOOTHE OP Place 1 drop into both eyes 2 (two) times daily as needed (dry eyes).   Tiotropium Bromide Monohydrate 2.5 MCG/ACT Aers Commonly known as:  SPIRIVA RESPIMAT Inhale 2 puffs into the lungs daily.      Allergies  Allergen Reactions  . Dust Mite Extract Shortness Of Breath and Other (See Comments)    "sneezing" (02/20/2012)  . Molds & Smuts Shortness Of Breath  . Morphine Sulfate Itching  . Other Shortness Of Breath and Other (See Comments)    Grass and weeds "sneezing; filled sinuses" (02/20/2012)  . Penicillins Rash and Other (See Comments)    "welts" (02/20/2012) Has patient had a PCN reaction causing immediate rash, facial/tongue/throat swelling, SOB or lightheadedness with hypotension: Unknown Has patient had a PCN reaction causing severe rash involving mucus membranes or skin necrosis: No Has patient had a PCN reaction that required hospitalization: No Has patient had a PCN reaction occurring within the last 10 years: No If all of the above answers are "NO", then may proceed with Cephalosporin use.   Marland Kitchen Reclast [Zoledronic Acid] Other (See Comments)    Fever, Put in hospital, dr said it was a reaction from a reaction   . Rofecoxib Swelling    Vioxx REACTION: feet  swelling  . Shrimp Flavor Anaphylaxis    ALL SHELLFISH  . Tetracycline Hcl Nausea And Vomiting  . Xolair [Omalizumab] Other (See Comments)    Caused Blood clot  . Dilaudid [Hydromorphone Hcl] Itching  . Hydrocodone-Acetaminophen Nausea And Vomiting  . Levofloxacin Other (See Comments)    REACTION: GI upset  . Oxycodone Hcl Nausea And Vomiting  . Paroxetine Nausea And Vomiting    Paxil   . Celecoxib Swelling    Feet swelling  . Diltiazem Swelling  . Lactose Intolerance (Gi) Other (See Comments)    Bloating and gas  . Tree Extract Other (See Comments)    "tested and told I was allergic to it; never experienced a reaction to it" (02/20/2012)   Follow-up Information    Aryal, Lenetta Quaker, MD. Schedule an appointment as soon as possible for a visit in 1 week(s).   Specialty:  Rheumatology Contact information: [address] [place]  [address] [phone]            The results of significant diagnostics from this hospitalization (including imaging, microbiology, ancillary and laboratory) are listed below for reference.    Significant Diagnostic Studies: Ct Abdomen Pelvis Wo Contrast  Result Date: 02/10/2018 CLINICAL DATA:  Fever, nausea/vomiting. Cough. History of right leg DVT and PE. History of breast cancer in remission. History of C diff. EXAM: CT CHEST, ABDOMEN AND PELVIS WITHOUT CONTRAST TECHNIQUE: Multidetector CT imaging of the chest, abdomen and pelvis was performed following the standard protocol without IV contrast. COMPARISON:  CT abdomen/pelvis dated 01/15/2018. CTA chest dated 12/13/2017. FINDINGS: CT CHEST FINDINGS Cardiovascular: The heart is normal in size. No pericardial effusion. No evidence thoracic aortic aneurysm. Atherosclerotic calcifications of the aortic arch. Coronary atherosclerosis the LAD and left coronary artery. Mediastinum/Nodes: No suspicious mediastinal lymphadenopathy. Lungs/Pleura: Minimal dependent atelectasis in the posterior  right upper and bilateral lower lobes. Mild scarring/atelectasis in the inferior right middle lobe and lingula. No focal consolidation. No suspicious pulmonary nodules. 4 mm triangular subpleural nodule in the lateral left lung base (series 4/image 102), unchanged from 2018, benign. No pleural effusion or pneumothorax. Musculoskeletal: Visualized osseous structures are within normal  limits. CT ABDOMEN PELVIS FINDINGS Hepatobiliary: Unenhanced liver is unremarkable. Status post cholecystectomy. No intrahepatic or extrahepatic ductal dilatation. Pancreas: Within normal limits. Spleen: Within normal limits. Adrenals/Urinary Tract: Adrenal glands are within normal limits. Kidneys are grossly unremarkable. No renal, ureteral, or bladder calculi. No hydronephrosis. Bladder is within normal limits. Stomach/Bowel: Stomach is within normal limits. No evidence of bowel obstruction. Normal appendix (series 2/image 91). No colonic wall thickening or inflammatory changes. Vascular/Lymphatic: No evidence of abdominal aortic aneurysm. Atherosclerotic calcifications of the abdominal aorta and branch vessels. No suspicious abdominopelvic lymphadenopathy. Reproductive: Status post hysterectomy. Ovaries within normal limits.  No right adnexal mass. Other: No abdominopelvic ascites. Musculoskeletal: Avascular necrosis of the bilateral femoral heads, better evaluated on recent prior MRI. Mild degenerative changes at L4-5. IMPRESSION: No colonic wall thickening or inflammatory changes to suggest infectious/inflammatory colitis. No CT findings to account for the patient's fever. Additional ancillary findings as above. Electronically Signed   By: Julian Hy M.D.   On: 02/10/2018 19:40   Dg Chest 2 View  Result Date: 03/06/2018 CLINICAL DATA:  Fever, shortness of breath, weakness EXAM: CHEST - 2 VIEW COMPARISON:  02/10/2018 FINDINGS: Low volumes with bibasilar atelectasis. Heart is normal size. No effusions or acute bony  abnormality. IMPRESSION: Low lung volumes, bibasilar atelectasis. Electronically Signed   By: Rolm Baptise M.D.   On: 03/06/2018 10:47   Dg Chest 2 View  Result Date: 02/10/2018 CLINICAL DATA:  Fever.  Nausea.  Emesis. EXAM: CHEST - 2 VIEW COMPARISON:  01/17/2018 FINDINGS: The heart size and mediastinal contours are within normal limits. Both lungs are clear. The visualized skeletal structures are unremarkable. IMPRESSION: No active cardiopulmonary disease. Electronically Signed   By: Nelson Chimes M.D.   On: 02/10/2018 10:05   Dg Forearm Right  Result Date: 02/10/2018 CLINICAL DATA:  Fall, skin abrasion/laceration, now with redness/swelling, possible cellulitis EXAM: RIGHT FOREARM - 2 VIEW COMPARISON:  None. FINDINGS: No fracture or dislocation is seen. The joint spaces are preserved. Visualized soft tissues are within normal limits. Postsurgical changes at the elbow. No radiopaque foreign body is seen. IMPRESSION: No fracture, dislocation, or radiopaque foreign body is seen. Electronically Signed   By: Julian Hy M.D.   On: 02/10/2018 17:55   Ct Head Wo Contrast  Result Date: 03/06/2018 CLINICAL DATA:  Weakness and shortness of breath for 5 days, altered level of consciousness EXAM: CT HEAD WITHOUT CONTRAST TECHNIQUE: Contiguous axial images were obtained from the base of the skull through the vertex without intravenous contrast. COMPARISON:  12/13/2017 FINDINGS: Brain: Generalized atrophy. Normal ventricular morphology. No midline shift or mass effect. Small vessel chronic ischemic changes of deep cerebral white matter. No intracranial hemorrhage, mass lesion, evidence of acute infarction, or extra-axial fluid collection. Vascular: No hyperdense vessels Skull: Intact Sinuses/Orbits: Clear Other: N/A IMPRESSION: No acute intracranial abnormalities. Electronically Signed   By: Lavonia Dana M.D.   On: 03/06/2018 13:48   Ct Chest Wo Contrast  Result Date: 02/10/2018 CLINICAL DATA:  Fever,  nausea/vomiting. Cough. History of right leg DVT and PE. History of breast cancer in remission. History of C diff. EXAM: CT CHEST, ABDOMEN AND PELVIS WITHOUT CONTRAST TECHNIQUE: Multidetector CT imaging of the chest, abdomen and pelvis was performed following the standard protocol without IV contrast. COMPARISON:  CT abdomen/pelvis dated 01/15/2018. CTA chest dated 12/13/2017. FINDINGS: CT CHEST FINDINGS Cardiovascular: The heart is normal in size. No pericardial effusion. No evidence thoracic aortic aneurysm. Atherosclerotic calcifications of the aortic arch. Coronary atherosclerosis the LAD and  left coronary artery. Mediastinum/Nodes: No suspicious mediastinal lymphadenopathy. Lungs/Pleura: Minimal dependent atelectasis in the posterior right upper and bilateral lower lobes. Mild scarring/atelectasis in the inferior right middle lobe and lingula. No focal consolidation. No suspicious pulmonary nodules. 4 mm triangular subpleural nodule in the lateral left lung base (series 4/image 102), unchanged from 2018, benign. No pleural effusion or pneumothorax. Musculoskeletal: Visualized osseous structures are within normal limits. CT ABDOMEN PELVIS FINDINGS Hepatobiliary: Unenhanced liver is unremarkable. Status post cholecystectomy. No intrahepatic or extrahepatic ductal dilatation. Pancreas: Within normal limits. Spleen: Within normal limits. Adrenals/Urinary Tract: Adrenal glands are within normal limits. Kidneys are grossly unremarkable. No renal, ureteral, or bladder calculi. No hydronephrosis. Bladder is within normal limits. Stomach/Bowel: Stomach is within normal limits. No evidence of bowel obstruction. Normal appendix (series 2/image 91). No colonic wall thickening or inflammatory changes. Vascular/Lymphatic: No evidence of abdominal aortic aneurysm. Atherosclerotic calcifications of the abdominal aorta and branch vessels. No suspicious abdominopelvic lymphadenopathy. Reproductive: Status post hysterectomy.  Ovaries within normal limits.  No right adnexal mass. Other: No abdominopelvic ascites. Musculoskeletal: Avascular necrosis of the bilateral femoral heads, better evaluated on recent prior MRI. Mild degenerative changes at L4-5. IMPRESSION: No colonic wall thickening or inflammatory changes to suggest infectious/inflammatory colitis. No CT findings to account for the patient's fever. Additional ancillary findings as above. Electronically Signed   By: Julian Hy M.D.   On: 02/10/2018 19:40   Dg Hand 2 View Left  Result Date: 03/07/2018 CLINICAL DATA:  New onset pain EXAM: LEFT HAND - 2 VIEW COMPARISON:  02/06/2006 FINDINGS: Interval worsening of osteoarthritis of the inter phalangeal joints. Most severe DIP joint involvement is at the index finger. There is also chronic degenerative arthropathy at the first carpometacarpal joint. It appears that the patient may have had previous resection of the trapezium. 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 Apparent interval resection of the trapezium. Osteoarthritis in the first carpometacarpal region. Electronically Signed   By: Nelson Chimes M.D.   On: 03/07/2018 16:30   Xr Foot 2 Views Left  Result Date: 03/02/2018 2 view radiographs of the left foot shows a healing metaphyseal diaphyseal fracture base of the fifth metatarsal left foot with good callus formation.  Xr Foot 2 Views Right  Result Date: 03/02/2018 2 view radiographs of the right foot shows a well healing fracture nondisplaced base of the fifth metatarsal.   Microbiology: Recent Results (from the past 240 hour(s))  Culture, blood (routine x 2)     Status: None (Preliminary result)   Collection Time: 03/06/18 12:31 PM  Result Value Ref Range Status   Specimen Description   Final    BLOOD RIGHT ANTECUBITAL Performed at [facility] [address], [place], [place] [address]    Special  Requests   Final    BOTTLES DRAWN AEROBIC AND ANAEROBIC Blood Culture results may not be optimal due to an excessive volume of blood received in culture bottles Performed at [facility] [address], [place], [place] [address]    Culture   Final    NO GROWTH 2 DAYS Performed at [facility] [address] [place], [place] [address]    Report Status PENDING  Incomplete  Culture, blood (routine x 2)     Status: None (Preliminary result)   Collection Time: 03/06/18 12:31 PM  Result Value Ref Range Status   Specimen Description   Final  BLOOD LEFT ANTECUBITAL Performed at [facility] [address], [place], [place] [address]    Special Requests   Final    BOTTLES DRAWN AEROBIC AND ANAEROBIC Blood Culture results may not be optimal due to an excessive volume of blood received in culture bottles Performed at [facility] [address] [place], [place] [address]    Culture   Final    NO GROWTH 2 DAYS Performed at [facility] [address] [place], [place] [address]    Report Status PENDING  Incomplete  Culture, blood (Routine X 2) w Reflex to ID Panel     Status: None (Preliminary result)   Collection Time: 03/07/18  1:02 PM  Result Value Ref Range Status   Specimen Description   Final    BLOOD LEFT ARM Performed at [facility] [address] [place], [place] [address]    Special Requests   Final    BOTTLES DRAWN AEROBIC AND ANAEROBIC Blood Culture adequate volume Performed at [facility] [address] [place], [place] [address]    Culture   Final    NO GROWTH 1 DAY Performed at [facility], [place] [address] [place], [place] [address]    Report Status PENDING  Incomplete  Culture, blood (Routine X 2) w Reflex to ID Panel     Status: None (Preliminary result)   Collection Time: 03/07/18  1:02 PM  Result Value Ref Range Status   Specimen  Description   Final    BLOOD RIGHT HAND Performed at [facility] [address], [place], [place] [address]    Special Requests   Final    BOTTLES DRAWN AEROBIC AND ANAEROBIC Blood Culture adequate volume Performed at [facility] [address] [place], [place] [address]    Culture   Final    NO GROWTH 1 DAY Performed at [facility], [place] [address] [place],  [address]    Report Status PENDING  Incomplete     Labs: Basic Metabolic Panel: Recent Labs  Lab 03/06/18 1228 03/07/18 0330  NA 137 138  K 4.5 3.8  CL 102 109  CO2 22 20*  GLUCOSE 116* 90  BUN 20 16  CREATININE 0.94 0.88  CALCIUM 8.8* 7.8*   Liver Function Tests: Recent Labs  Lab 03/06/18 1228 03/07/18 0330  AST 31 22  ALT 25 17  ALKPHOS 58 47  BILITOT 0.9 1.1  PROT 7.6 6.1*  ALBUMIN 3.0* 2.5*   No results for input(s): LIPASE, AMYLASE in the last 168 hours. No results for input(s): AMMONIA in the last 168 hours. CBC: Recent Labs  Lab 03/06/18 1228 03/07/18 0330 03/08/18 0310  WBC 11.1* 10.2 12.4*  NEUTROABS 8.0*  --  10.8*  HGB 9.8* 7.9* 8.1*  HCT 32.2* 26.1* 26.6*  MCV 102.5* 103.2* 100.0  PLT 436* 376 421*   Cardiac Enzymes: No results for input(s): CKTOTAL, CKMB, CKMBINDEX, TROPONINI in the last 168 hours. BNP: BNP (last 3 results) Recent Labs    12/13/17 1051 12/21/17 0237 03/06/18 1225  BNP 60.6 117.0* 51.7    ProBNP (last 3 results) No results for input(s): PROBNP in the last 8760 hours.  CBG: Recent Labs  Lab 03/06/18 1220  GLUCAP 107*       Signed:  Nita Sells MD   [facility] 03/09/2018, 11:09 AM

## 2018-03-11 LAB — CULTURE, BLOOD (ROUTINE X 2)
Culture: NO GROWTH
Culture: NO GROWTH

## 2018-03-12 LAB — CULTURE, BLOOD (ROUTINE X 2)
Culture: NO GROWTH
Culture: NO GROWTH
Special Requests: ADEQUATE
Special Requests: ADEQUATE

## 2018-03-17 ENCOUNTER — Other Ambulatory Visit: Payer: Self-pay | Admitting: Family Medicine

## 2018-03-18 ENCOUNTER — Ambulatory Visit: Payer: Medicare Other | Admitting: Family Medicine

## 2018-03-18 ENCOUNTER — Ambulatory Visit: Payer: Medicare Other | Admitting: Allergy and Immunology

## 2018-03-18 ENCOUNTER — Encounter: Payer: Self-pay | Admitting: Allergy and Immunology

## 2018-03-18 ENCOUNTER — Ambulatory Visit
Admission: RE | Admit: 2018-03-18 | Discharge: 2018-03-18 | Disposition: A | Discharge status: Home / Self Care | Payer: Medicare Other | Source: Ambulatory Visit | Attending: Family Medicine | Admitting: Family Medicine

## 2018-03-18 ENCOUNTER — Encounter: Payer: Self-pay | Admitting: Family Medicine

## 2018-03-18 VITALS — BP 148/74 | HR 84 | Temp 98.6°F | Resp 18 | Ht 62.0 in | Wt 122.0 lb

## 2018-03-18 VITALS — BP 128/70 | HR 88 | Temp 98.5°F | Resp 18

## 2018-03-18 DIAGNOSIS — K219 Gastro-esophageal reflux disease without esophagitis: Secondary | ICD-10-CM

## 2018-03-18 DIAGNOSIS — G500 Trigeminal neuralgia: Secondary | ICD-10-CM

## 2018-03-18 DIAGNOSIS — J4550 Severe persistent asthma, uncomplicated: Secondary | ICD-10-CM

## 2018-03-18 DIAGNOSIS — R0989 Other specified symptoms and signs involving the circulatory and respiratory systems: Secondary | ICD-10-CM

## 2018-03-18 DIAGNOSIS — D649 Anemia, unspecified: Secondary | ICD-10-CM | POA: Diagnosis not present

## 2018-03-18 DIAGNOSIS — M2550 Pain in unspecified joint: Secondary | ICD-10-CM | POA: Diagnosis not present

## 2018-03-18 DIAGNOSIS — R700 Elevated erythrocyte sedimentation rate: Secondary | ICD-10-CM | POA: Diagnosis not present

## 2018-03-18 DIAGNOSIS — M353 Polymyalgia rheumatica: Secondary | ICD-10-CM

## 2018-03-18 DIAGNOSIS — J3089 Other allergic rhinitis: Secondary | ICD-10-CM | POA: Diagnosis not present

## 2018-03-18 DIAGNOSIS — D539 Nutritional anemia, unspecified: Secondary | ICD-10-CM

## 2018-03-18 MED ORDER — DEXLANSOPRAZOLE 60 MG PO CPDR: 60.0000 mg | DELAYED_RELEASE_CAPSULE | Freq: Every morning | ORAL | 5 refills | Status: DC

## 2018-03-18 NOTE — Progress Notes (Signed)
Subjective:    Patient ID: Ebony Scott, female    DOB: 03-22-1950, 68 y.o.   MRN: [id number]  HPI  She was recently admitted to the hospital having been found with altered mental status lying in the floor for several days.  She was found to have a RLE DVT and  Small bilateral PE's was also found to be in rhabdomyolysis.  His discharge from the hospital on November 4 to a skilled nursing facility.  She was recently readmitted on November 26 with nausea vomiting and diarrhea and was found to have C. difficile colitis there was presumed to be acquired in the nursing home.  I have copied relevant portions of the discharge summary below and included them for my reference: Admit date: 01/14/2018 Discharge date: 01/23/2018  Admitted From: Home Disposition: Home  Recommendations for Outpatient Follow-up:  1. Follow up with PCP in 1week with repeat CBC/CMP 2. Follow-up with Dr. Sharol Given as an outpatient  3. Patient will benefit from outpatient evaluation by GI 4. Follow-up with ED if symptoms worsen or new appear   Home Health: Yes: PT/RN Equipment/Devices: None Discharge Condition: Stable CODE STATUS: Full Diet recommendation: Heart Healthy  Brief/Interim Summary: 68 year old female with history of allergic rhinitis, seasonal allergies, asthma, anemia, anxiety, depression, also arthritis, pseudogout, breast cancer in remission, cataracts, chronic lower back pain, chronic neck pain, bilateral DVTs, diverticulosis, fibromyalgia, GERD, Graves' disease, hyperthyroidism, IBS, osteoporosis, pneumonia who was recently admitted to [facility] for acute PE and DVT and was started on anticoagulation and was discharged to skilled nursing facility presented with worsening GI symptoms with nausea, vomiting and diarrhea. She was started on oral vancomycin for positive C. difficile antigen. She had significant left hip pain which is thought to be pseudogout and was started on steroids.  Her condition has improved.   She will be discharged home on oral vancomycin and steroids.  Discharge Diagnoses:  Principal Problem:   Clostridium difficile colitis Active Problems:   Hyperthyroidism   GERD   Pseudogout   Chronic diastolic CHF (congestive heart failure) (HCC)   CKD (chronic kidney disease), stage III (HCC)   Debility   Clostridium difficile diarrhea   DVT (deep venous thrombosis) (HCC)   Depression   Closed fracture of fifth metatarsal bone of right foot, initial encounter   Fracture of base of fifth metatarsal bone with routine healing, left  C. difficile colitis -Diarrhea improving.   Currently on oral vancomycin.  Finish 14-day course of oral vancomycin -Currently afebrile. -Discharge patient home  Left hip pain most likely due to acute pseudogout -X-ray/CT scan of the pelvis was negative for acute abnormality. MRI showed small joint effusion and also partial tear of the gluteus minimus with edema. These findings were discussed with orthopedic surgeon Dr. Cheri Fowler prior hospitalist who recommended arthrocentesis. Patient underwent arthrocentesis on 01/01/2018 however only had minimal amount of clear fluid yellow fluid was aspirated and it was not enough for cell counts or crystal examination. Cultures have been negative so far. Patient had extremely elevated CRP at 15.8. CRP improving  at 6.5 on 01/22/2018. -Patient was empirically started on intravenous Solu-Medrol.   It has been switched to oral prednisone from today.  She feels better.  Will discharge on oral prednisone 40 mg daily for 7 days.  Outpatient follow-up with PCP and/or orthopedics -PT recommended SNF but patient refused.  Patient will need home health upon discharge.  Bilateral fifth metatarsal closed fracture -Orthopedic evaluation by Dr. Sharol Given appreciated.  Patient will need to  wear fracture boots as per Dr. Sharol Given.  Outpatient follow-up with Dr. Sharol Given.    Abnormal LFTs -Improving -LFTs were elevated in November 25, 2017 as  well for which right upper quadrant ultrasound/CT abdomen was negative for any acute findings. She is status post cholecystectomy. No worsening abdominal pain -Off Singulair and statin -Outpatient follow-up  History of chronic diastolic CHF -Compensated. Of IV fluids.   Outpatient follow-up  Chronic kidney disease stage III -Stable.   Outpatient follow-up  Generalized deconditioning -Continue PT eval. Patient is declining SNF placement.  Discharge home with home health.  Macrocytic anemia -Hemoglobin stable.   Outpatient follow-up  History of acute PE and DVT diagnosed recently -Continue Xarelto  Depression -Continue Remeron  Hyperthyroidism -No longer on methimazole. TSH was 5 in October 2019. Outpatient monitor  Severe protein calorie malnutrition -Outpatient follow-up   01/24/18 She is here today for follow-up.  She is on day 9 of 14 of vancomycin 4 times daily.  She states that she is feeling much better and the diarrhea has improved.  The pain in her left hip is also improved.  She is currently on 40 mill grams a day of prednisone for pseudogout.  I recommended decreasing the prednisone to 20 mg a day over the next 3 days and then discontinuing altogether.  She is not currently taking a probiotic and I recommended that she had Florastor.  She denies any fevers or chills.  She denies any abdominal pain.  She denies any hematochezia.  She is accompanied today by her daughter.  We had a long discussion about her polypharmacy.  The majority of her medications or over-the-counter supplements that the patient has elected to take on her own.  At present I am prescribing Remeron for depression and insomnia.  I have recommended that she stay on Xarelto indefinitely given her previous history of DVTs and now a second unprovoked DVT.  I am also prescribing gabapentin 300 mg p.o. twice daily for neuropathic pain and chronic low back pain.  She believes she benefits from this.   I recommended that she discontinue her statin as well as any Tylenol due to her elevated liver function test.  She is receiving her pulmonary medication from her pulmonologist.  She is on allergy medication through the care of her allergist.  Her only on a proton pump inhibitor as well as an H2 blocker.  Believe this was started due to her respiratory issues.  I believe the patient can discontinue these medications if she is not experiencing reflux.  At that time, my plan was: C. difficile colitis seems to be symptomatically improving.  Complete vancomycin but add Florastor 250 mg p.o. twice daily as a probiotic.  I recommended that she continue Xarelto 20 mg a day indefinitely.  She has a history of DVT.  Now she has a history of an unprovoked DVT and bilateral PEs causing syncope.  Therefore I recommended that her risk of current DVT is too high and that she stay on long-term anticoagulation.  My biggest concern moving forward is her persistently elevated LFTs.  Yesterday in the emergency room, they were found to be still persistently elevated in the 100-200 range.  She has had a right upper quadrant ultrasound as well as a CT scan of the abdomen and pelvis to see no visible abnormality in the liver.  Viral hepatitis serologies have been negative.  I would like the patient to come back in 2 weeks to repeat her liver function test when  she is not acutely ill.  If persistently elevated, I would recommend screen the patient for alpha-1 antitrypsin deficiency given her pulmonary issues as well as checking for autoimmune hepatitis and hemochromatosis.  If no other explanation is discovered, I would recommend a GI consultation to discuss possible biopsy.  02/07/18 Recently suffered a skin tear to her right arm on the dorsum of her right forearm.  Over the last week she has been performing dressing changes at home with the care of in-home nursing.  She has now developed fevers as high as 101.  The dorsum of the right  arm is erythematous, hot to the touch, there is a weeping superficial ulcer on the dorsum of the right arm.  It appears to be a large skin tear that is approximately 10 x 5 cm.  It is oozing green-yellowish purulent foul-smelling material.  There is erythema spreading around the wound to a total diameter of 17 cm x 8 cm.  She appears to a developed a secondary cellulitis.  At that time, my plan was: The wound was covered in Silvadene, covered with nonadherent gauze, and wrapped gently and Covan.  Wound care was discussed.  Recommend doxycycline 100 mg p.o. twice daily for 10 days given her history of an allergy to penicillin.  This should allow strep and staph coverage including MRSA.  Given her recent elevations in liver function test, I will check a CMP to monitor her liver function test, check an ANA as well as antimitochondrial antibodies and anti-smooth muscle antibodies to evaluate for autoimmune hepatitis.  I have recommended using Tylenol sparingly for fever if greater than 101.  Given Xarelto I want her to avoid NSAIDs however if the fever rises greater than 101 and occasional use of ibuprofen for fever would be reasonable.  02/21/2018 Patient presents today with several concerns.  First issue is the cellulitis on her right forearm.  After I saw the patient, she continued to have fevers and was ultimately admitted to the hospital the following Monday.  In the hospital she remained afebrile.  She was given vancomycin and Ancef overnight and then was discharged home.  She has now completed the doxycycline I prescribed to her.  Blood cultures were negative in the hospital.  The wound on the right forearm has completely healed.  The ulcer and abrasion looks much better.  There is some postinflammatory hyperpigmentation that is pink but there is no obvious erythema or warmth or redness or pain.  Therefore this issue is closed.  Second issue is the elevated LFTs that I mentioned at her first visit.  ANA was  negative.  Anti-smooth muscle antibodies were negative.  There was no evidence of autoimmune hepatitis.  Her most recent liver function test have normalized.  Therefore I do not believe that there is any autoimmune hepatitis or other ongoing issue with her liver.  Third issue with the C. difficile colitis.  She has discontinued oral vancomycin.  She has had no further diarrhea.  She denies any abdominal pain.  Therefore this issue is closed.  Fourth issue is polyarthralgias.  She continues to remain on prednisone.  This was prescribed to her in the hospital.  I am concerned about her already fragile bone health as she has been diagnosed with osteoporosis.  At the present time she is in bilateral Cam walkers due to fifth metatarsal fractures.  Therefore I believe prolonged prednisone is only can exacerbate this issue.  However we are unaware of what is causing her  diffuse body pain.  The pain is primarily in her feet.  At other times will move to her hands and her finger joints.  It will the fingers become red hot and swollen according to the patient.  She takes prednisone, the symptoms are controlled well.  Fifth issue are night sweats.  She states that she has been having night sweats.  This is become an issue after she left the hospital.  She denies any fever since leaving the hospital.  Sixth issue is anemia.  Her hemoglobin dropped to 8.8 while in the hospital.  Patient is on Xarelto for DVT.  She denies any melena or hematochezia.  She denies any hematemesis.  Therefore this is anemia of unknown origin.  At that time, my plan was: Problem #1 cellulitis of the forearm.  This is resolved after doxycycline and the wound is healed.  No further follow-up is necessary for this.  Problem #2 C. difficile colitis.  This has seemingly resolved.  No further follow-up for this.  Problem #3 elevated liver function test.  These have resolved as well.  Repeat a CMP today.  Monitor closely.  Withhold statin at the current  time in case this was statin induced liver formation.  Patient can resume Tylenol 1000 mg twice daily as needed for body aches.  We will recheck her liver function test and her cholesterol in 3 months.  Problem #4 polyarthralgias primarily in the hands and feet.  This is been presumed to be due to pseudogout.  However I have recommended a rheumatology consult.  I will also screen the patient for rheumatoid arthritis with rheumatoid factor and anti-CCP.  Her ANA has already been proven to be negative.  Problem #5 or night sweats.  This may be medication or hormone based.  I recommended that the patient check her temperature to see if she is actually running a fever at night.  If so we will need to work-up fever of unknown origin.  Problem #6 her hypothyroidism.  This normalized after she discontinued her methimazole.  Recheck TSH in 3 months.  Last checked in the hospital on December 23 was normal.  03/06/18 Patient presents today with altered mental status.  Apparently starting Friday she started having fevers off and on.  The fevers have been as high as 103.0.  She has become progressively more lethargic and confused.  This morning she is sitting in a wheelchair unable to stand.  She is unable to speak.  When I asked her to tell me her symptoms, she can simply repeat over and over I do not know.  I try to perform a Mini-Mental status exam.  Patient is able to tell me the correct day and month as well as location.  She can only remember 1 out of 3 objects on recall.  When I asked her to spell world in reverse, she continues to spell it forwards confused as to what reverse or backwards mean.  She had to be carried out of her house this morning due to weakness and fatigue.  She has been having a cough as well as diarrhea over the last 4 days.  Today she appears dehydrated.  Her complexion is sallow.  She denies any shortness of breath.  However mucous membranes are dry on examination.  She has prominent bibasilar  crackles suggesting either atelectasis or possibly bilateral pneumonia.  Regarding the work-up for fever of unknown origin, the patient's had a negative ANA, negative anti-smooth muscle antibody, negative toyed factor,  negative anti-CCP.  She had a CT scan of the abdomen and pelvis as well as chest in December 23.  However the patient today is clearly altered and delirious with off and on fevers concerning for sepsis of unknown origin.  AT that time, my plan was: Has fever of unknown origin and is lethargic and appears dehydrated.  I am concerned about sepsis from an unknown source.  On her exam today with my limited resources in my office the only abnormality I can see or find is bibasilar crackles concerning for possible pneumonia however the patient is confused and is unable to answer questions appropriately.  She lives independently.  Therefore I do not feel that she is stable to go home with antibiotics for presumed pneumonia.  I recommended emergency room evaluation for imaging immediately of the chest as well as lab work to evaluate for sources of sepsis.  Given her diarrhea and recent C. difficile colitis, I would also be concerned about recurrent C. difficile colitis.  03/18/18 Patient is here today for hospital discharge follow-up.  I have reviewed the discharge summary from the hospital.  Patient's lab work was significant for an elevated CRP as well as an elevated sed rate greater than 140.  Patient was lethargic with fevers.  As soon as she started prednisone, her fevers resolved and she hard to feel better.  She is now down to 30 mg a day of prednisone.  She is starting to have some mild pain in the joints of her wrist but otherwise is doing much better than the way she appeared at the last visit.  The question is why she having fevers of unknown origin.  Why is her sed rate and her CRP elevated.  She is seeing rheumatology who is done a battery of blood tests which are still pending.  My suspicion  is some type of atypical polymyalgia rheumatica versus an underlying bone marrow abnormality.  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  She also reports a cough today.  There are bibasilar crackles on exam however this was present at the last visit I suspect may be atelectasis  Past Medical History:  Diagnosis Date  . Allergic rhinitis   . Allergy    SEASONAL  . Anemia   . Anxiety    pt denies  . Arthritis    "mostly the hands" (02/20/2012)  . Asthma   . Breast cancer ([place]) 1998   in remission  . Cataract    REMOVED  . Chronic lower back pain   . Chronic neck pain   . Complication of anesthesia    "had hard time waking up from it several times" (02/20/2012)  . Depression    "some; don't take anything for it" (02/20/2012)  . Diverticulosis   . DVT (deep venous thrombosis) ([place])   . Exertional dyspnea   . Fibromyalgia 11/2011  . GERD (gastroesophageal reflux disease)   . Graves disease   . Headache(784.0)    "related to allergies; more at different times during the year" (02/20/2012)  . Hemorrhoids   . Hiatal hernia    back and neck  . Hx of adenomatous colonic polyps 04/12/2016  . Hypercholesteremia    good cholesterol is high  . Hypothyroidism   . IBS (irritable bowel syndrome)   . Moderate persistent asthma    -FeV1 72%  2011, -IgE 102 2011, CT sinus Neg 2011  . Osteoporosis    on reclast yearly  . Pneumonia 04/2011; ~ 11/2011   "double; single" (02/20/2012)   Past Surgical History:  Procedure Laterality Date  . ANTERIOR AND POSTERIOR REPAIR  1990's  . APPENDECTOMY    . BREAST LUMPECTOMY  1998   left  . CARPOMETACARPEL ([place]) FUSION OF THUMB WITH AUTOGRAFT FROM RADIUS  ~ 2009   "both thumbs" (02/20/2012)  . CATARACT EXTRACTION W/ INTRAOCULAR LENS  IMPLANT, BILATERAL  2012  . CERVICAL DISCECTOMY  10/2001   C5-C6  .  CERVICAL FUSION  2003   C3-C4  . CHOLECYSTECTOMY    . COLONOSCOPY    . DEBRIDEMENT TENNIS ELBOW  ?1970's   right  . ESOPHAGOGASTRODUODENOSCOPY    . HYSTERECTOMY    . KNEE ARTHROPLASTY  ?1990's   "?right; w/cartilage repair" (02/20/2012)  . NASAL SEPTUM SURGERY  1980's  . POSTERIOR CERVICAL FUSION/FORAMINOTOMY  2004   "failed initial fusion; rewired  anterior neck" (02/20/2012)  . TONSILLECTOMY  ~ 1953  . VESICOVAGINAL FISTULA CLOSURE W/ TAH  1988  . VIDEO BRONCHOSCOPY Bilateral 08/23/2016   Procedure: VIDEO BRONCHOSCOPY WITH FLUORO;  Surgeon: Javier Glazier, MD;  Location: [facility];  Service: Cardiopulmonary;  Laterality: Bilateral;   Current Outpatient Medications on File Prior to Visit  Medication Sig Dispense Refill  . albuterol (PROAIR HFA) 108 (90 Base) MCG/ACT inhaler Inhale 2 puffs into the lungs every 4 (four) hours as needed for wheezing or shortness of breath. 18 g 5  . Alpha-D-Galactosidase (BEANO PO) Take 1 capsule by mouth 3 (three) times daily as needed (gas/ bloating).     . Alpha-Lipoic Acid 600 MG CAPS Take 600 mg by mouth daily.     Marland Kitchen azelastine (ASTELIN) 0.1 % nasal spray USE 2 SPRAYS IN EACH NOSTRIL DAILY AS DIRECTED 30 mL 5  . CALCIUM-MAGNESIUM PO Take 1 tablet by mouth at bedtime.    . cetirizine (ZYRTEC) 10 MG tablet Take 5 mg by mouth every evening.     . cholecalciferol (VITAMIN D) 25 MCG (1000 UT) tablet Take 1,000 Units by mouth daily.     Marland Kitchen denosumab (PROLIA) 60 MG/ML SOLN injection Inject 60 mg into the skin every 6 (six) months. Administer in upper arm, thigh, or abdomen    . dexlansoprazole (DEXILANT) 60 MG capsule Take 1 capsule (60 mg total) by mouth daily. 30 capsule 5  . EPINEPHrine (EPIPEN 2-PAK) 0.3 mg/0.3 mL IJ SOAJ injection Inject 0.3 mLs (0.3 mg total) into the muscle Once PRN for up to 1 dose. 1 Device 1  . gabapentin (NEURONTIN) 600 MG tablet TAKE 1 TABLET BY MOUTH DAILY 90 tablet 3  . Hydrocortisone (GERHARDT'S BUTT CREAM) CREA Apply 1  application topically 4 (four) times daily. 1 each 1  . Lactase (LACTAID FAST ACT) 9000 units TABS Take 18,000 Units by mouth 4 (four) times daily as needed (dairy consumption).     Marland Kitchen lidocaine (LIDODERM) 5 % Place 1 patch onto the skin daily as needed (pain). Remove & Discard patch within 12 hours or as directed by MD    . mupirocin ointment (BACTROBAN) 2 % Place 1 application into the nose at bedtime. To prevent nose bleeds    . predniSONE (DELTASONE) 20 MG tablet Take 2 tablets (40 mg total) by mouth daily before breakfast for 14 days, THEN 1 tablet (20 mg total) daily before breakfast for 14 days. 42 tablet 0  . PRESCRIPTION MEDICATION Place 2  puffs into both ears once a week. Place capsule in dispenser and instill 2 puffs in each ear once a week after washing hair. CS (X) AHCL    CHLORAMP/SULFU/AMPHO B/ H DROC 100-100-5 mg capsule - compounded at [place]    . Propylene Glycol-Glycerin (SOOTHE OP) Place 1 drop into both eyes 2 (two) times daily as needed (dry eyes).     Marland Kitchen Respiratory Therapy Supplies (FLUTTER) DEVI Use as directed 1 each 0  . rivaroxaban (XARELTO) 20 MG TABS tablet Take 1 tablet (20 mg total) by mouth daily with supper. 90 tablet 3  . saccharomyces boulardii (FLORASTOR) 250 MG capsule Take 1 capsule (250 mg total) by mouth 2 (two) times daily. 60 capsule 2  . Tiotropium Bromide Monohydrate (SPIRIVA RESPIMAT) 2.5 MCG/ACT AERS Inhale 2 puffs into the lungs daily. 1 Inhaler 5  . alum & mag hydroxide-simeth (MAALOX/MYLANTA) 200-200-20 MG/5ML suspension Take 15 mLs by mouth every 4 (four) hours as needed for indigestion or heartburn. (Patient not taking: Reported on 03/18/2018) 355 mL 0  . diclofenac sodium (VOLTAREN) 1 % GEL Apply 2 g topically 2 (two) times daily. (Patient not taking: Reported on 03/18/2018)     No current facility-administered medications on file prior to visit.    Allergies  Allergen Reactions  . Dust Mite Extract Shortness Of Breath and Other (See Comments)     "sneezing" (02/20/2012)  . Molds & Smuts Shortness Of Breath  . Morphine Sulfate Itching  . Other Shortness Of Breath and Other (See Comments)    Grass and weeds "sneezing; filled sinuses" (02/20/2012)  . Penicillins Rash and Other (See Comments)    "welts" (02/20/2012) Has patient had a PCN reaction causing immediate rash, facial/tongue/throat swelling, SOB or lightheadedness with hypotension: Unknown Has patient had a PCN reaction causing severe rash involving mucus membranes or skin necrosis: No Has patient had a PCN reaction that required hospitalization: No Has patient had a PCN reaction occurring within the last 10 years: No If all of the above answers are "NO", then may proceed with Cephalosporin use.   Marland Kitchen Reclast [Zoledronic Acid] Other (See Comments)    Fever, Put in hospital, dr said it was a reaction from a reaction   . Rofecoxib Swelling    Vioxx REACTION: feet swelling  . Shrimp Flavor Anaphylaxis    ALL SHELLFISH  . Tetracycline Hcl Nausea And Vomiting  . Xolair [Omalizumab] Other (See Comments)    Caused Blood clot  . Dilaudid [Hydromorphone Hcl] Itching  . Hydrocodone-Acetaminophen Nausea And Vomiting  . Levofloxacin Other (See Comments)    REACTION: GI upset  . Oxycodone Hcl Nausea And Vomiting  . Paroxetine Nausea And Vomiting    Paxil   . Celecoxib Swelling    Feet swelling  . Diltiazem Swelling  . Lactose Intolerance (Gi) Other (See Comments)    Bloating and gas  . Tree Extract Other (See Comments)    "tested and told I was allergic to it; never experienced a reaction to it" (02/20/2012)   Social History   Socioeconomic History  . Marital status: Divorced    Spouse name: Not on file  . Number of children: 2  . Years of education: college  . Highest education level: Not on file  Occupational History  . Occupation: Disabled    Comment: Retired Engineer, production: RETIRED  Social Needs  . Financial resource strain: Not on file   . Food insecurity:    Worry: Not on  file    Inability: Not on file  . Transportation needs:    Medical: Not on file    Non-medical: Not on file  Tobacco Use  . Smoking status: Passive Smoke Exposure - Never Smoker  . Smokeless tobacco: Never Used  . Tobacco comment: Parents  Substance and Sexual Activity  . Alcohol use: Not Currently    Alcohol/week: 0.0 standard drinks  . Drug use: No  . Sexual activity: Never  Lifestyle  . Physical activity:    Days per week: Not on file    Minutes per session: Not on file  . Stress: Not on file  Relationships  . Social connections:    Talks on phone: Not on file    Gets together: Not on file    Attends religious service: Not on file    Active member of club or organization: Not on file    Attends meetings of clubs or organizations: Not on file    Relationship status: Not on file  . Intimate partner violence:    Fear of current or ex partner: Not on file    Emotionally abused: Not on file    Physically abused: Not on file    Forced sexual activity: Not on file  Other Topics Concern  . Not on file  Social History Narrative   Patient lives at home alone. Patient  divorced.    Patient has her BS degree.   Right handed.   Caffeine- sometimes coffee.      [facility]:   Born in [place], [place]. She worked as a Copywriter, advertising. She has no pets currently. She does have indoor plants. Previously had mold in her home that was remediated. Carpet was removed.            Review of Systems  All other systems reviewed and are negative.      Objective:   Physical Exam Vitals signs reviewed.  Constitutional:      General: She is not in acute distress.    Appearance: Normal appearance. She is well-developed. She is not ill-appearing or toxic-appearing.  HENT:     Right Ear: Tympanic membrane and ear canal normal.     Left Ear: Tympanic membrane and ear canal normal.     Nose: No congestion or rhinorrhea.     Mouth/Throat:      Mouth: Mucous membranes are dry.  Cardiovascular:     Rate and Rhythm: Normal rate and regular rhythm.     Heart sounds: Normal heart sounds.  Pulmonary:     Effort: Pulmonary effort is normal. No respiratory distress.     Breath sounds: No stridor or decreased air movement. Rales present. No wheezing.  Abdominal:     General: Bowel sounds are normal. There is no distension.     Palpations: Abdomen is soft.     Tenderness: There is no abdominal tenderness. There is no guarding.  Skin:    Findings: No erythema.  Neurological:     Mental Status: She is alert and oriented to person, place, and time.     Cranial Nerves: No cranial nerve deficit.     Sensory: No sensory deficit.     Motor: No weakness.     Coordination: Coordination normal.           Assessment & Plan:  Bibasilar crackles - Plan: DG Chest 2 View  Anemia, unspecified type - Plan: Fecal Globin By Immunochemistry, CBC with Differential/Platelet, Iron  Elevated sed rate  Polyarthralgia  Patient has fever of unknown origin.  Leading suspicion is an autoimmune disease however work-up thus far has been negative.  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  She is under the care of a rheumatologist and further blood work is pending.  At the present time we will make no adjustments in her prednisone and I will defer this to her rheumatologist.  However I still question why the patient developed a DVT initially last fall prompting all the complications that arose thereafter.  This was unprovoked.  This would raise the concern for hypercoagulable state.  She is also been having fever of unknown origin.  There is also been a substantial drop in her hemoglobin from 15 down to 7.9 since the DVT.  Now during that time she has been started on anticoagulation however her initial stool study was negative for blood.  Work-up has showed a low iron however she has been on iron replacement now for several weeks and her hemoglobin has not improved.  In fact it is  dropped further.  Therefore I am going to repeat a stool study today.  If there is blood in the stool, she will need a GI evaluation for blood loss causing her anemia.  If the stool study is negative, I will repeat an iron level.  If the iron level is now normal, I would recommend an oncology hematology consultation for anemia and to evaluate for possible underlying bone marrow abnormalities that may explain the patient's fever of unknown origin, diffuse joint and bone pain, hypercoagulable state, etc.  Patient states that the rheumatologist is going to schedule her for a PET scan as well if the initial lab work is normal.  He however he is waiting for the lab work to return prior to scheduling the PET scan.  Therefore I will be interested to see the results of the PET scan as well.  She does have bibasilar crackles today on her exam however I will obtain a chest x-ray as I believe this may be atelectasis.  If there is evidence of infiltrate, I will treat the patient for possible hospital-acquired pneumonia

## 2018-03-18 NOTE — Patient Instructions (Addendum)
  1. 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 Continue Periactin 4 mg tablet  2 tablets at bedtime  2. 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 Continue Dexilant 60 mg in the morning  B. Continue ranitidine 300 mg in the evening  4. Continue to treat inflammation:   A. Nasonex one spray each nostril 1-2 times a day   B. montelukast 10 mg daily  C. Spiriva respimat 2 inhalations one time per day  5. Use bronchodilator and Astelin and Zyrtec and nasal saline and nasal bactroban if needed  7. Recommend that with next blood draw check SPEP with Immunofixation (IgA/G/M), Vit B12, Folate, reticulocyte count, Transferrin / TIBC panel, CPK, Aldolase, anti-acetylcholine receptor antibody  8. Return to clinic in 6 months or earlier if problem

## 2018-03-18 NOTE — Progress Notes (Signed)
Follow-up Note  Referring Provider: [name], MD Primary Provider: [name], MD Date of Office Visit: 03/18/2018  Subjective:   Ebony Scott (DOB: [birth year]) is a 68 y.o. female who returns to the [facility] on 03/18/2018 in re-evaluation of the following:  HPI: Ebony Scott returns to this clinic in evaluation of multiple issues that have arisen since her last visit of 16 Jul 2017.  I have seen her in this clinic for allergic rhinoconjunctivitis and facial pain syndrome and a history of reflux induced respiratory disease and asthma followed by Dr. Lake Bells.  Apparently over the course of the past 6 months she has had DVT / PE, multiple episodes of muscular weakness requiring hospitalization, possible Addison's disease, possible polymyalgia rheumatica, normocytic-macrocytic anemia with hemoglobin of 8-9 and an overall inflamed state as defined by elevated sed rate of greater than 140.  Currently she is on high-dose systemic steroids and empiric therapy of PMR.  On these doses of high-dose steroid she is not really having any significant respiratory issue although she has cardiovascular deconditioned secondary to all of her hospitalizations.  She does not use a short acting bronchodilator.  Dr. Lake Bells took her off her inhaled steroid and took her off her biological agent as he felt that these were not really helping her at this point.  She still has excellent control of her facial pain syndrome while using cyproheptadine.  She has not really been having any issues with reflux.  Allergies as of 03/18/2018      Reactions   Dust Mite Extract Shortness Of Breath, Other (See Comments)   "sneezing" (02/20/2012)   Molds & Smuts Shortness Of Breath   Morphine Sulfate Itching   Other Shortness Of Breath, Other (See Comments)   Grass and weeds "sneezing; filled sinuses" (02/20/2012)   Penicillins Rash, Other (See Comments)   "welts" (02/20/2012) Has patient had a PCN  reaction causing immediate rash, facial/tongue/throat swelling, SOB or lightheadedness with hypotension: Unknown Has patient had a PCN reaction causing severe rash involving mucus membranes or skin necrosis: No Has patient had a PCN reaction that required hospitalization: No Has patient had a PCN reaction occurring within the last 10 years: No If all of the above answers are "NO", then may proceed with Cephalosporin use.   Reclast [zoledronic Acid] Other (See Comments)   Fever, Put in hospital, dr said it was a reaction from a reaction    Rofecoxib Swelling   Vioxx REACTION: feet swelling   Shrimp Flavor Anaphylaxis   ALL SHELLFISH   Tetracycline Hcl Nausea And Vomiting   Xolair [omalizumab] Other (See Comments)   Caused Blood clot   Dilaudid [hydromorphone Hcl] Itching   Hydrocodone-acetaminophen Nausea And Vomiting   Levofloxacin Other (See Comments)   REACTION: GI upset   Oxycodone Hcl Nausea And Vomiting   Paroxetine Nausea And Vomiting   Paxil   Celecoxib Swelling   Feet swelling   Diltiazem Swelling   Lactose Intolerance (gi) Other (See Comments)   Bloating and gas   Tree Extract Other (See Comments)   "tested and told I was allergic to it; never experienced a reaction to it" (02/20/2012)      Medication List      acetaminophen 650 MG CR tablet Commonly known as:  TYLENOL Take 1,300 mg by mouth every 8 (eight) hours as needed for pain.   albuterol 108 (90 Base) MCG/ACT inhaler Commonly known as:  PROAIR HFA Inhale 2 puffs into the  lungs every 4 (four) hours as needed for wheezing or shortness of breath.   Alpha-Lipoic Acid 600 MG Caps Take 600 mg by mouth daily.   alum & mag hydroxide-simeth 200-200-20 MG/5ML suspension Commonly known as:  MAALOX/MYLANTA Take 15 mLs by mouth every 4 (four) hours as needed for indigestion or heartburn.   azelastine 0.1 % nasal spray Commonly known as:  ASTELIN USE 2 SPRAYS IN EACH NOSTRIL DAILY AS DIRECTED   BEANO PO Take 1  capsule by mouth 3 (three) times daily as needed (gas/ bloating).   CALCIUM-MAGNESIUM PO Take 1 tablet by mouth at bedtime.   cetirizine 10 MG tablet Commonly known as:  ZYRTEC Take 5 mg by mouth every evening.   cholecalciferol 25 MCG (1000 UT) tablet Commonly known as:  VITAMIN D Take 1,000 Units by mouth daily.   denosumab 60 MG/ML Soln injection Commonly known as:  PROLIA Inject 60 mg into the skin every 6 (six) months. Administer in upper arm, thigh, or abdomen   dexlansoprazole 60 MG capsule Commonly known as:  DEXILANT Take 1 capsule (60 mg total) by mouth every morning.   diclofenac sodium 1 % Gel Commonly known as:  VOLTAREN Apply 2 g topically 2 (two) times daily.   EPINEPHrine 0.3 mg/0.3 mL Soaj injection Commonly known as:  EPIPEN 2-PAK Inject 0.3 mLs (0.3 mg total) into the muscle Once PRN for up to 1 dose.   FLUTTER Devi Use as directed   gabapentin 600 MG tablet Commonly known as:  NEURONTIN TAKE 1 TABLET BY MOUTH DAILY   Gerhardt's butt cream Crea Apply 1 application topically 4 (four) times daily.   LACTAID FAST ACT 9000 units Tabs Generic drug:  Lactase Take 18,000 Units by mouth 4 (four) times daily as needed (dairy consumption).   lidocaine 5 % Commonly known as:  LIDODERM Place 1 patch onto the skin daily as needed (pain). Remove & Discard patch within 12 hours or as directed by MD   mupirocin ointment 2 % Commonly known as:  BACTROBAN Place 1 application into the nose at bedtime. To prevent nose bleeds   predniSONE 20 MG tablet Commonly known as:  DELTASONE Take 2 tablets (40 mg total) by mouth daily before breakfast for 14 days, THEN 1 tablet (20 mg total) daily before breakfast for 14 days. Start taking on:  March 10, 2018   PRESCRIPTION MEDICATION Place 2 puffs into both ears once a week. Place capsule in dispenser and instill 2 puffs in each ear once a week after washing hair. CS (X) AHCL    CHLORAMP/SULFU/AMPHO B/ H DROC  100-100-5 mg capsule - compounded at [facility]   ranitidine 300 MG tablet Commonly known as:  ZANTAC Take 300 mg by mouth daily.   rivaroxaban 20 MG Tabs tablet Commonly known as:  XARELTO Take 1 tablet (20 mg total) by mouth daily with supper.   saccharomyces boulardii 250 MG capsule Commonly known as:  FLORASTOR Take 1 capsule (250 mg total) by mouth 2 (two) times daily.   SOOTHE OP Place 1 drop into both eyes 2 (two) times daily as needed (dry eyes).   Tiotropium Bromide Monohydrate 2.5 MCG/ACT Aers Commonly known as:  SPIRIVA RESPIMAT Inhale 2 puffs into the lungs daily.       Past Medical History:  Diagnosis Date  . Allergic rhinitis   . Allergy    SEASONAL  . Anemia   . Anxiety    pt denies  . Arthritis    "mostly  the hands" (02/20/2012)  . Asthma   . Breast cancer ([place]) 1998   in remission  . Cataract    REMOVED  . Chronic lower back pain   . Chronic neck pain   . Complication of anesthesia    "had hard time waking up from it several times" (02/20/2012)  . Depression    "some; don't take anything for it" (02/20/2012)  . Diverticulosis   . DVT (deep venous thrombosis) ([place])   . Exertional dyspnea   . Fibromyalgia 11/2011  . GERD (gastroesophageal reflux disease)   . Graves disease   . Headache(784.0)    "related to allergies; more at different times during the year" (02/20/2012)  . Hemorrhoids   . Hiatal hernia    back and neck  . Hx of adenomatous colonic polyps 04/12/2016  . Hypercholesteremia    good cholesterol is high  . Hypothyroidism   . IBS (irritable bowel syndrome)   . Moderate persistent asthma    -FeV1 72% 2011, -IgE 102 2011, CT sinus Neg 2011  . Osteoporosis    on reclast yearly  . Pneumonia 04/2011; ~ 11/2011   "double; single" (02/20/2012)    Past Surgical History:  Procedure Laterality Date  . ANTERIOR AND POSTERIOR REPAIR  1990's  . APPENDECTOMY    . BREAST LUMPECTOMY  1998   left  . CARPOMETACARPEL ([place]) FUSION OF THUMB WITH  AUTOGRAFT FROM RADIUS  ~ 2009   "both thumbs" (02/20/2012)  . CATARACT EXTRACTION W/ INTRAOCULAR LENS  IMPLANT, BILATERAL  2012  . CERVICAL DISCECTOMY  10/2001   C5-C6  . CERVICAL FUSION  2003   C3-C4  . CHOLECYSTECTOMY    . COLONOSCOPY    . DEBRIDEMENT TENNIS ELBOW  ?1970's   right  . ESOPHAGOGASTRODUODENOSCOPY    . HYSTERECTOMY    . KNEE ARTHROPLASTY  ?1990's   "?right; w/cartilage repair" (02/20/2012)  . NASAL SEPTUM SURGERY  1980's  . POSTERIOR CERVICAL FUSION/FORAMINOTOMY  2004   "failed initial fusion; rewired  anterior neck" (02/20/2012)  . TONSILLECTOMY  ~ 1953  . VESICOVAGINAL FISTULA CLOSURE W/ TAH  1988  . VIDEO BRONCHOSCOPY Bilateral 08/23/2016   Procedure: VIDEO BRONCHOSCOPY WITH FLUORO;  Surgeon: Javier Glazier, MD;  Location: [facility];  Service: Cardiopulmonary;  Laterality: Bilateral;    Review of systems negative except as noted in HPI / PMHx or noted below:  Review of Systems  Constitutional: Negative.   HENT: Negative.   Eyes: Negative.   Respiratory: Negative.   Cardiovascular: Negative.   Gastrointestinal: Negative.   Genitourinary: Negative.   Musculoskeletal: Negative.   Skin: Negative.   Neurological: Negative.   Endo/Heme/Allergies: Negative.   Psychiatric/Behavioral: Negative.      Objective:   Vitals:   03/18/18 1526  BP: 128/70  Pulse: 88  Resp: 18  Temp: 98.5 F (36.9 C)  SpO2: 96%          Physical Exam Constitutional:      Appearance: She is not diaphoretic.     Comments: Moon face  HENT:     Head: Normocephalic.     Right Ear: Tympanic membrane, ear canal and external ear normal.     Left Ear: Tympanic membrane, ear canal and external ear normal.     Nose: Nose normal. No mucosal edema or rhinorrhea.     Mouth/Throat:     Pharynx: Uvula midline. No oropharyngeal exudate.  Eyes:     Conjunctiva/sclera: Conjunctivae normal.  Neck:     Thyroid: No thyromegaly.  Trachea: Trachea normal. No tracheal tenderness or  tracheal deviation.  Cardiovascular:     Rate and Rhythm: Normal rate and regular rhythm.     Heart sounds: Normal heart sounds, S1 normal and S2 normal. No murmur.  Pulmonary:     Effort: No respiratory distress.     Breath sounds: Normal breath sounds. No stridor. No wheezing or rales.  Lymphadenopathy:     Head:     Right side of head: No tonsillar adenopathy.     Left side of head: No tonsillar adenopathy.     Cervical: No cervical adenopathy.  Skin:    Findings: No erythema or rash.     Nails: There is no clubbing.   Neurological:     Mental Status: She is alert.     Diagnostics:    Spirometry was performed and demonstrated an FEV1 of 1.32 at 64 % of predicted.  The patient had an Asthma Control Test with the following results: ACT Total Score: 11.    Results of blood tests obtained 08 March 2018 identifies WBC 12.4, absolute eosinophil 0, absolute lymphocyte 700, hemoglobin 8.1, MCV 100, platelet 421, sed rate > 140  Results of blood tests obtained 06 March 2018 identifies ferritin 541 NG/mL, negative ANA, cortisol (am) 10.0 UG/DL  Results of blood tests obtained 21 February 2018 identifies negative RA latex and CCP < 16 U/L  Results of a chest x-ray obtained 06 March 2018 identified the following:  Low volumes with bibasilar atelectasis. Heart is normal size. No effusions or acute bony abnormality.  Results of a chest CT scan obtained 10 February 2018 identified the following:  Cardiovascular: The heart is normal in size. No pericardial effusion.  No evidence thoracic aortic aneurysm. Atherosclerotic calcifications of the aortic arch.  Coronary atherosclerosis the LAD and left coronary artery.  Mediastinum/Nodes: No suspicious mediastinal lymphadenopathy.  Lungs/Pleura: Minimal dependent atelectasis in the posterior right upper and bilateral lower lobes. Mild scarring/atelectasis in the inferior right middle lobe and lingula.  No focal  consolidation.  No suspicious pulmonary nodules. 4 mm triangular subpleural nodule in the lateral left lung base (series 4/image 102), unchanged from 2018, benign.  No pleural effusion or pneumothorax.  Musculoskeletal: Visualized osseous structures are within normal limits.  Results of an echocardiogram obtained 05 August 2017 identified the following:  - Left ventricle: The cavity size was normal. There was mild focal   basal hypertrophy of the septum. Systolic function was vigorous.   The estimated ejection fraction was in the range of 65% to 70%.   Wall motion was normal; there were no regional wall motion   abnormalities. Doppler parameters are consistent with abnormal   left ventricular relaxation (grade 1 diastolic dysfunction).  Assessment and Plan:   1. Other allergic rhinitis   2. Facial pain syndrome   3. LPRD (laryngopharyngeal reflux disease)   4. Asthma, severe persistent, well-controlled   5. Macrocytic anemia   6. Polymyalgia rheumatica ([place])     1. 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 Continue Periactin 4 mg tablet  2 tablets at bedtime  2. 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 Continue Dexilant 60 mg in the morning  B. Continue ranitidine 300 mg in the evening  4. Continue to treat inflammation:   A. Nasonex one spray each nostril 1-2 times a day   B. montelukast 10 mg daily  C. Spiriva respimat 2 inhalations one time per day  5. Use bronchodilator and Astelin  and Zyrtec and nasal saline and nasal bactroban if needed  7. Recommend that with next blood draw check SPEP with Immunofixation (IgA/G/M), Vit B12, Folate, reticulocyte count, Transferrin / TIBC panel, CPK, Aldolase, anti-acetylcholine receptor antibody.  8. Return to clinic in 6 months or earlier if problem   Laylani obviously has a very severe systemic state associated with inflammation that very well could be polymyalgia rheumatica but there are other issues  to consider including a myositis or myasthenia gravis and the etiology of her normocytic-macrocytic anemia which I assume will be thoroughly worked up over the course of the next several months.  I did make recommendations about what blood test she should have in investigation of some of these issues.  I will see her back in this clinic in 6 months or earlier if there is a problem while she continues to use anti-inflammatory agents for her airway, Periactin directed against her facial pain syndrome, and therapy directed against reflux.  Allena Katz, MD [facility]ergy / [facility] [place]

## 2018-03-19 ENCOUNTER — Encounter: Payer: Self-pay | Admitting: Allergy and Immunology

## 2018-03-19 ENCOUNTER — Telehealth: Payer: Self-pay | Admitting: *Deleted

## 2018-03-19 DIAGNOSIS — D539 Nutritional anemia, unspecified: Secondary | ICD-10-CM

## 2018-03-19 LAB — CBC WITH DIFFERENTIAL/PLATELET
Absolute Monocytes: 890 cells/uL (ref 200–950)
Basophils Absolute: 43 cells/uL (ref 0–200)
Basophils Relative: 0.2 %
Eosinophils Absolute: 0 cells/uL — ABNORMAL LOW (ref 15–500)
Eosinophils Relative: 0 %
HCT: 27.9 % — ABNORMAL LOW (ref 35.0–45.0)
Hemoglobin: 9.4 g/dL — ABNORMAL LOW (ref 11.7–15.5)
Lymphs Abs: 868 cells/uL (ref 850–3900)
MCH: 32 pg (ref 27.0–33.0)
MCHC: 33.7 g/dL (ref 32.0–36.0)
MCV: 94.9 fL (ref 80.0–100.0)
MPV: 10 fL (ref 7.5–12.5)
Monocytes Relative: 4.1 %
Neutro Abs: 19899 cells/uL — ABNORMAL HIGH (ref 1500–7800)
Neutrophils Relative %: 91.7 %
Platelets: 391 10*3/uL (ref 140–400)
RBC: 2.94 10*6/uL — ABNORMAL LOW (ref 3.80–5.10)
RDW: 18.2 % — ABNORMAL HIGH (ref 11.0–15.0)
Total Lymphocyte: 4 %
WBC: 21.7 10*3/uL — ABNORMAL HIGH (ref 3.8–10.8)

## 2018-03-19 LAB — IRON: Iron: 57 ug/dL (ref 45–160)

## 2018-03-19 NOTE — Addendum Note (Signed)
Addended by: Horris Latino on: 03/19/2018 03:13 PM   Modules accepted: Orders

## 2018-03-19 NOTE — Telephone Encounter (Signed)
Patient informed of blood test. Will stop by the office tomorrow.

## 2018-03-19 NOTE — Telephone Encounter (Signed)
-----   Message from Jiles Prows, MD sent at 03/19/2018  8:22 AM EST ----- Please inform patient that she needs the following tests: Recommend that with next blood draw check SPEP with Immunofixation (IgA/G/M), Vit B12, Folate, reticulocyte count, Transferrin / TIBC panel, CPK, Aldolase, anti-acetylcholine receptor antibody. She can have them performed with her next blood draw with other MD visits or come to clinic to have them draw.

## 2018-03-19 NOTE — Addendum Note (Signed)
Addended by: Horris Latino on: 03/19/2018 03:34 PM   Modules accepted: Orders

## 2018-03-20 ENCOUNTER — Other Ambulatory Visit: Payer: Medicare Other

## 2018-03-21 ENCOUNTER — Encounter: Payer: Self-pay | Admitting: Family Medicine

## 2018-03-21 LAB — FECAL GLOBIN BY IMMUNOCHEMISTRY
FECAL GLOBIN RESULT:: NOT DETECTED
MICRO NUMBER:: 127949
SPECIMEN QUALITY:: ADEQUATE

## 2018-03-26 LAB — IFE, PE AND FLC, SERUM
Albumin SerPl Elph-Mcnc: 3 g/dL (ref 2.9–4.4)
Albumin/Glob SerPl: 0.9 (ref 0.7–1.7)
Alpha 1: 0.5 g/dL — ABNORMAL HIGH (ref 0.0–0.4)
Alpha2 Glob SerPl Elph-Mcnc: 1.4 g/dL — ABNORMAL HIGH (ref 0.4–1.0)
B-Globulin SerPl Elph-Mcnc: 1.1 g/dL (ref 0.7–1.3)
Gamma Glob SerPl Elph-Mcnc: 0.5 g/dL (ref 0.4–1.8)
Globulin, Total: 3.5 g/dL (ref 2.2–3.9)
Ig Kappa Free Light Chain: 10.5 mg/L (ref 3.3–19.4)
Ig Lambda Free Light Chain: 16.2 mg/L (ref 5.7–26.3)
IgA/Immunoglobulin A, Serum: 134 mg/dL (ref 87–352)
IgG (Immunoglobin G), Serum: 655 mg/dL — ABNORMAL LOW (ref 700–1600)
IgM (Immunoglobulin M), Srm: 107 mg/dL (ref 26–217)
Kappa/Lambda FluidC Ratio: 0.65 (ref 0.26–1.65)
Total Protein: 6.5 g/dL (ref 6.0–8.5)

## 2018-03-26 LAB — ACETYLCHOLINE RECEPTOR AB, ALL
AChR Binding Ab, Serum: <0.03 nmol/L (ref 0.00–0.24)
Acetylchol Block Ab: 19 % (ref 0–25)
Acetylcholine Modulat Ab: <12 % (ref 0–20)

## 2018-03-26 LAB — IRON,TIBC AND FERRITIN PANEL
Ferritin: 387 ng/mL — ABNORMAL HIGH (ref 15–150)
Iron Saturation: 23 % (ref 15–55)
Iron: 56 ug/dL (ref 27–139)
Total Iron Binding Capacity: 246 ug/dL — ABNORMAL LOW (ref 250–450)
UIBC: 190 ug/dL (ref 118–369)

## 2018-03-26 LAB — B12 AND FOLATE PANEL
Folate: >20 ng/mL (ref >3.0)
Vitamin B-12: 907 pg/mL (ref 232–1245)

## 2018-03-26 LAB — ALDOLASE: Aldolase: 6.6 U/L (ref 3.3–10.3)

## 2018-03-26 LAB — CK: Total CK: 14 U/L — ABNORMAL LOW (ref 24–173)

## 2018-03-26 LAB — RETICULOCYTES: Retic Ct Pct: 3.2 % — ABNORMAL HIGH (ref 0.6–2.6)

## 2018-03-26 LAB — TRANSFERRIN: Transferrin: 194 mg/dL — ABNORMAL LOW (ref 200–370)

## 2018-03-27 ENCOUNTER — Encounter: Payer: Self-pay | Admitting: Internal Medicine

## 2018-03-27 ENCOUNTER — Encounter: Payer: Self-pay | Admitting: Family Medicine

## 2018-03-27 ENCOUNTER — Ambulatory Visit: Payer: Medicare Other | Admitting: Internal Medicine

## 2018-03-27 VITALS — BP 164/82 | HR 83 | Temp 98.0°F | Ht 61.0 in | Wt 124.5 lb

## 2018-03-27 DIAGNOSIS — R509 Fever, unspecified: Secondary | ICD-10-CM

## 2018-03-27 NOTE — Addendum Note (Signed)
Addended by: Thayer Headings on: 03/27/2018 02:46 PM   Modules accepted: Orders

## 2018-03-27 NOTE — Progress Notes (Signed)
[facility]      Reason for Consult: FUO    Referring Physician: Dr. Kathlene November    Patient ID: Ebony Scott, female    DOB: 01-05-1951, 68 y.o.   MRN: [id number]  HPI:   Here for evaluation of high fever since October 2019.   She first developed a fever in October 2019 up to 102 after being found down in her house from altered mental status and encephalopathy.  She had some rhabdomyolysis and high fever initially treated for sepsis but no infectious etiology noted and antibiotics stopped.  She was found to have a DVT and some small PEs, likely from her encephalopathy and being down.  She required some inpatient rehab after that as well as at home.  Then in November she came back to the hospital again with fever and was found to have C. difficile colitis.  She was treated with oral vancomycin and this did resolve.  She again though came back in December with a fever up to 103 and her only symptom that she has had has been significant wrist swelling bilaterally.  She gets swelling particular in the left that goes down to her hand and also on her feet and ankle gets swollen.  This mainly is in the joint.  She received steroids and did have significant improvement with that.  She had a significant work-up and no other infectious etiology found.  She has been taken off her methimazole, she did have aspiration of her hip from pain there but no significant findings noted.  She did have a significant ESR that was greater than 140.  Her swelling of her wrists do improve with steroids.  She was sent here for evaluation of potential infectious etiologies of her fever.  She has had CT scans of her head, chest and abdomen, x-ray as well.  Previous record reviewed from her hospitalizations and reviewed above.  Past Medical History:  Diagnosis Date  . Allergic rhinitis   . Allergy    SEASONAL  . Anemia   . Anxiety    pt denies  . Arthritis    "mostly the hands" (02/20/2012)  . Asthma    . Breast cancer ([place]) 1998   in remission  . Cataract    REMOVED  . Chronic lower back pain   . Chronic neck pain   . Complication of anesthesia    "had hard time waking up from it several times" (02/20/2012)  . Depression    "some; don't take anything for it" (02/20/2012)  . Diverticulosis   . DVT (deep venous thrombosis) ([place])   . Exertional dyspnea   . Fibromyalgia 11/2011  . GERD (gastroesophageal reflux disease)   . Graves disease   . Headache(784.0)    "related to allergies; more at different times during the year" (02/20/2012)  . Hemorrhoids   . Hiatal hernia    back and neck  . Hx of adenomatous colonic polyps 04/12/2016  . Hypercholesteremia    good cholesterol is high  . Hypothyroidism   . IBS (irritable bowel syndrome)   . Moderate persistent asthma    -FeV1 72% 2011, -IgE 102 2011, CT sinus Neg 2011  . Osteoporosis    on reclast yearly  . Pneumonia 04/2011; ~ 11/2011   "double; single" (02/20/2012)    Prior to Admission medications   Medication Sig Start Date End Date Taking? Authorizing Provider  acetaminophen (TYLENOL) 650 MG CR tablet Take 1,300 mg by mouth every  8 (eight) hours as needed for pain.   Yes [provider]  albuterol (PROAIR HFA) 108 (90 Base) MCG/ACT inhaler Inhale 2 puffs into the lungs every 4 (four) hours as needed for wheezing or shortness of breath. 02/18/17  Yes [name], MD  Alpha-D-Galactosidase (BEANO PO) Take 1 capsule by mouth 3 (three) times daily as needed (gas/ bloating).    Yes [provider]  Alpha-Lipoic Acid 600 MG CAPS Take 600 mg by mouth daily.    Yes [provider]  alum & mag hydroxide-simeth (MAALOX/MYLANTA) 200-200-20 MG/5ML suspension Take 15 mLs by mouth every 4 (four) hours as needed for indigestion or heartburn. 01/23/18  Yes Aline August, MD  azelastine (ASTELIN) 0.1 % nasal spray USE 2 SPRAYS IN EACH NOSTRIL DAILY AS DIRECTED 07/16/17  Yes Kozlow, Donnamarie Poag, MD  CALCIUM-MAGNESIUM PO Take  1 tablet by mouth at bedtime.   Yes [provider]  cetirizine (ZYRTEC) 10 MG tablet Take 5 mg by mouth every evening.    Yes [provider]  cholecalciferol (VITAMIN D) 25 MCG (1000 UT) tablet Take 1,000 Units by mouth daily.    Yes [provider]  denosumab (PROLIA) 60 MG/ML SOLN injection Inject 60 mg into the skin every 6 (six) months. Administer in upper arm, thigh, or abdomen   Yes [provider]  dexlansoprazole (DEXILANT) 60 MG capsule Take 1 capsule (60 mg total) by mouth every morning. 03/18/18  Yes Kozlow, Donnamarie Poag, MD  diclofenac sodium (VOLTAREN) 1 % GEL Apply 2 g topically 2 (two) times daily. 01/23/18  Yes Aline August, MD  EPINEPHrine (EPIPEN 2-PAK) 0.3 mg/0.3 mL IJ SOAJ injection Inject 0.3 mLs (0.3 mg total) into the muscle Once PRN for up to 1 dose. 12/02/17  Yes Juanito Doom, MD  gabapentin (NEURONTIN) 600 MG tablet TAKE 1 TABLET BY MOUTH DAILY 03/17/18  Yes [name], MD  Hydrocortisone (GERHARDT'S BUTT CREAM) CREA Apply 1 application topically 4 (four) times daily. 02/11/18  Yes Georgette Shell, MD  Lactase (LACTAID FAST ACT) 9000 units TABS Take 18,000 Units by mouth 4 (four) times daily as needed (dairy consumption).    Yes [provider]  lidocaine (LIDODERM) 5 % Place 1 patch onto the skin daily as needed (pain). Remove & Discard patch within 12 hours or as directed by MD 01/23/18  Yes Aline August, MD  mupirocin ointment (BACTROBAN) 2 % Place 1 application into the nose at bedtime. To prevent nose bleeds 01/23/18  Yes Aline August, MD  predniSONE (DELTASONE) 20 MG tablet Take 2 tablets (40 mg total) by mouth daily before breakfast for 14 days, THEN 1 tablet (20 mg total) daily before breakfast for 14 days. 03/10/18 04/07/18 Yes Nita Sells, MD  PRESCRIPTION MEDICATION Place 2 puffs into both ears once a week. Place capsule in dispenser and instill 2 puffs in each ear once a week after washing hair. CS  (X) AHCL    CHLORAMP/SULFU/AMPHO B/ H DROC 100-100-5 mg capsule - compounded at [facility]   Yes [provider]  Propylene Glycol-Glycerin (SOOTHE OP) Place 1 drop into both eyes 2 (two) times daily as needed (dry eyes).    Yes [provider]  ranitidine (ZANTAC) 300 MG tablet Take 300 mg by mouth daily.   Yes [provider]  Respiratory Therapy Supplies (FLUTTER) DEVI Use as directed 10/22/14  Yes Elsie Stain, MD  rivaroxaban (XARELTO) 20 MG TABS tablet Take 1 tablet (20 mg total) by  mouth daily with supper. 02/07/18  Yes [name], MD  saccharomyces boulardii (FLORASTOR) 250 MG capsule Take 1 capsule (250 mg total) by mouth 2 (two) times daily. 01/24/18  Yes [name], MD  Tiotropium Bromide Monohydrate (SPIRIVA RESPIMAT) 2.5 MCG/ACT AERS Inhale 2 puffs into the lungs daily. 07/16/17  Yes Kozlow, Donnamarie Poag, MD    Allergies  Allergen Reactions  . Dust Mite Extract Shortness Of Breath and Other (See Comments)    "sneezing" (02/20/2012)  . Molds & Smuts Shortness Of Breath  . Morphine Sulfate Itching  . Other Shortness Of Breath and Other (See Comments)    Grass and weeds "sneezing; filled sinuses" (02/20/2012)  . Penicillins Rash and Other (See Comments)    "welts" (02/20/2012) Has patient had a PCN reaction causing immediate rash, facial/tongue/throat swelling, SOB or lightheadedness with hypotension: Unknown Has patient had a PCN reaction causing severe rash involving mucus membranes or skin necrosis: No Has patient had a PCN reaction that required hospitalization: No Has patient had a PCN reaction occurring within the last 10 years: No If all of the above answers are "NO", then may proceed with Cephalosporin use.   Marland Kitchen Reclast [Zoledronic Acid] Other (See Comments)    Fever, Put in hospital, dr said it was a reaction from a reaction   . Rofecoxib Swelling    Vioxx REACTION: feet swelling  . Shrimp Flavor Anaphylaxis    ALL SHELLFISH  .  Tetracycline Hcl Nausea And Vomiting  . Xolair [Omalizumab] Other (See Comments)    Caused Blood clot  . Dilaudid [Hydromorphone Hcl] Itching  . Hydrocodone-Acetaminophen Nausea And Vomiting  . Levofloxacin Other (See Comments)    REACTION: GI upset  . Oxycodone Hcl Nausea And Vomiting  . Paroxetine Nausea And Vomiting    Paxil   . Celecoxib Swelling    Feet swelling  . Diltiazem Swelling  . Lactose Intolerance (Gi) Other (See Comments)    Bloating and gas  . Tree Extract Other (See Comments)    "tested and told I was allergic to it; never experienced a reaction to it" (02/20/2012)    Social History   Tobacco Use  . Smoking status: Passive Smoke Exposure - Never Smoker  . Smokeless tobacco: Never Used  . Tobacco comment: Parents  Substance Use Topics  . Alcohol use: Not Currently    Alcohol/week: 0.0 standard drinks  . Drug use: No    Family History  Problem Relation Age of Onset  . Allergies Mother   . Heart disease Mother   . Arthritis Mother   . Lung cancer Mother   . Diabetes Mother   . Allergies Father   . Heart disease Father   . Arthritis Father   . Stroke Father   . Colon cancer Other        Maternal half aunt/Maternal half uncle  . Colitis Daughter   . Diabetes Maternal Grandfather     Review of Systems  Constitutional: positive for fevers, though none at this time; or negative for chills, malaise, anorexia and weight loss Respiratory: positive for cough that is old, negative for sputum or hemoptysis Gastrointestinal: negative for nausea, vomiting and diarrhea Hematologic/lymphatic: negative for lymphadenopathy Neurological: negative for headaches and dizziness Endocrine: negative for no exophthalmos All other systems reviewed and are negative    Constitutional: in no apparent distress  Vitals:   03/27/18 0906  BP: (!) 164/82  Pulse: 83  Temp: 98 F (36.7 C)   EYES: anicteric ENMT: no  thrush Cardiovascular: Cor RRR Respiratory: CTA B; normal  respiratory effort GI: Bowel sounds are normal, liver is not enlarged, spleen is not enlarged Musculoskeletal: no pedal edema noted; bilateral wrists with no significant swelling; notable warmth and decrease rom Skin: negatives: no rash Hematologic: no cervical lad Neuro: non-focal  Labs: Lab Results  Component Value Date   WBC 21.7 (H) 03/18/2018   HGB 9.4 (L) 03/18/2018   HCT 27.9 (L) 03/18/2018   MCV 94.9 03/18/2018   PLT 391 03/18/2018    Lab Results  Component Value Date   CREATININE 0.88 03/07/2018   BUN 16 03/07/2018   NA 138 03/07/2018   K 3.8 03/07/2018   CL 109 03/07/2018   CO2 20 (L) 03/07/2018    Lab Results  Component Value Date   ALT 17 03/07/2018   AST 22 03/07/2018   ALKPHOS 47 03/07/2018   BILITOT 1.1 03/07/2018   INR 2.22 03/07/2018     Assessment: FUO - I mainly suspect this is noninfectious.  Particularly with the wrist findings and improvement on steroids.  Certainly a bacterial infection at this point would not be consistent since she has been on steroids and has had a prolonged course.  Viral illness possible but is much more long-lasting and waxing and waning course would not be consistent with Reiter's syndrome.  All scans have been relatively unremarkable so the only real finding is the swelling of her wrists.  She will continue to follow-up with rheumatology.  Could also consider thyroid with her recent cessation of methimazole.   Plan: 1) labs: TSH, HIV, serum protein electrophoresis, hepatitis Follow up PRN if any concerns noted

## 2018-03-28 ENCOUNTER — Encounter: Payer: Self-pay | Admitting: Family Medicine

## 2018-03-31 ENCOUNTER — Encounter: Payer: Self-pay | Admitting: Family Medicine

## 2018-04-01 ENCOUNTER — Encounter: Payer: Self-pay | Admitting: Family Medicine

## 2018-04-02 LAB — TEST AUTHORIZATION

## 2018-04-02 LAB — HIV ANTIBODY (ROUTINE TESTING W REFLEX): HIV 1&2 Ab, 4th Generation: NONREACTIVE

## 2018-04-02 LAB — PROTEIN ELECTROPHORESIS, SERUM, WITH REFLEX
Albumin ELP: 3.2 g/dL — ABNORMAL LOW (ref 3.8–4.8)
Alpha 1: 0.4 g/dL — ABNORMAL HIGH (ref 0.2–0.3)
Alpha 2: 1.1 g/dL — ABNORMAL HIGH (ref 0.5–0.9)
Beta 2: 0.3 g/dL (ref 0.2–0.5)
Beta Globulin: 0.4 g/dL (ref 0.4–0.6)
Gamma Globulin: 0.6 g/dL — ABNORMAL LOW (ref 0.8–1.7)
Total Protein: 5.9 g/dL — ABNORMAL LOW (ref 6.1–8.1)

## 2018-04-02 LAB — TSH: TSH: 1.5 mIU/L (ref 0.40–4.50)

## 2018-04-02 LAB — LACTATE DEHYDROGENASE: LDH: 211 U/L (ref 120–250)

## 2018-04-02 LAB — IFE INTERPRETATION

## 2018-04-02 LAB — HEPATITIS B SURFACE ANTIGEN: Hepatitis B Surface Ag: NONREACTIVE

## 2018-04-07 ENCOUNTER — Other Ambulatory Visit: Payer: Self-pay | Admitting: Allergy and Immunology

## 2018-04-07 DIAGNOSIS — J4550 Severe persistent asthma, uncomplicated: Secondary | ICD-10-CM

## 2018-04-08 ENCOUNTER — Encounter: Payer: Self-pay | Admitting: Family Medicine

## 2018-04-08 ENCOUNTER — Other Ambulatory Visit (HOSPITAL_COMMUNITY): Payer: Self-pay | Admitting: Rheumatology

## 2018-04-08 ENCOUNTER — Other Ambulatory Visit: Payer: Self-pay | Admitting: Rheumatology

## 2018-04-08 DIAGNOSIS — Z853 Personal history of malignant neoplasm of breast: Secondary | ICD-10-CM

## 2018-04-08 DIAGNOSIS — R509 Fever, unspecified: Secondary | ICD-10-CM

## 2018-04-10 MED FILL — GERHARDT'S BUTT CREAM BULK: 60 days supply | Qty: 60 | Fill #1

## 2018-04-11 ENCOUNTER — Other Ambulatory Visit: Payer: Self-pay | Admitting: Family Medicine

## 2018-04-15 ENCOUNTER — Telehealth: Payer: Self-pay | Admitting: Family Medicine

## 2018-04-15 NOTE — Telephone Encounter (Signed)
Pt needs updated letter that Ebony Scott used to write for her. It is in her letters tab.

## 2018-04-16 ENCOUNTER — Encounter: Payer: Self-pay | Admitting: Family Medicine

## 2018-04-16 NOTE — Telephone Encounter (Signed)
Documented updated and pt aware via mychart

## 2018-04-17 ENCOUNTER — Other Ambulatory Visit: Payer: Medicare Other

## 2018-04-17 ENCOUNTER — Encounter: Payer: Self-pay | Admitting: Family Medicine

## 2018-04-17 ENCOUNTER — Ambulatory Visit: Payer: Medicare Other | Attending: Family Medicine | Admitting: Rehabilitative and Restorative Service Providers"

## 2018-04-17 ENCOUNTER — Other Ambulatory Visit: Payer: Self-pay

## 2018-04-17 ENCOUNTER — Encounter: Payer: Self-pay | Admitting: Rehabilitative and Restorative Service Providers"

## 2018-04-17 DIAGNOSIS — H8111 Benign paroxysmal vertigo, right ear: Secondary | ICD-10-CM | POA: Diagnosis not present

## 2018-04-17 DIAGNOSIS — D649 Anemia, unspecified: Secondary | ICD-10-CM

## 2018-04-18 ENCOUNTER — Encounter (HOSPITAL_COMMUNITY): Payer: Medicare Other

## 2018-04-18 LAB — CBC WITH DIFFERENTIAL/PLATELET
Absolute Monocytes: 1237 cells/uL — ABNORMAL HIGH (ref 200–950)
Basophils Absolute: 80 cells/uL (ref 0–200)
Basophils Relative: 0.6 %
Eosinophils Absolute: 146 cells/uL (ref 15–500)
Eosinophils Relative: 1.1 %
HCT: 33.8 % — ABNORMAL LOW (ref 35.0–45.0)
Hemoglobin: 10.9 g/dL — ABNORMAL LOW (ref 11.7–15.5)
Lymphs Abs: 4150 cells/uL — ABNORMAL HIGH (ref 850–3900)
MCH: 32.1 pg (ref 27.0–33.0)
MCHC: 32.2 g/dL (ref 32.0–36.0)
MCV: 99.4 fL (ref 80.0–100.0)
MPV: 9.9 fL (ref 7.5–12.5)
Monocytes Relative: 9.3 %
Neutro Abs: 7687 cells/uL (ref 1500–7800)
Neutrophils Relative %: 57.8 %
Platelets: 312 10*3/uL (ref 140–400)
RBC: 3.4 10*6/uL — ABNORMAL LOW (ref 3.80–5.10)
RDW: 16.2 % — ABNORMAL HIGH (ref 11.0–15.0)
Total Lymphocyte: 31.2 %
WBC: 13.3 10*3/uL — ABNORMAL HIGH (ref 3.8–10.8)

## 2018-04-18 NOTE — Therapy (Signed)
[facility] [address] [place], [place], [address] Phone: [phone]   Fax:  [phone]  Physical Therapy Evaluation  Patient Details  Name: Ebony Scott MRN: [id number] Date of Birth: 01-Apr-1950 Referring Provider (PT): Erling Cruz, MD   Encounter Date: 04/17/2018  PT End of Session - 04/18/18 0927    Visit Number  1    Number of Visits  4    Date for PT Re-Evaluation  05/17/18    Authorization Type  [employer] medicare    PT Start Time  1024    PT Stop Time  1104    PT Time Calculation (min)  40 min    Activity Tolerance  Patient tolerated treatment well    Behavior During Therapy  [facility] for tasks assessed/performed       Past Medical History:  Diagnosis Date  . Allergic rhinitis   . Allergy    SEASONAL  . Anemia   . Anxiety    pt denies  . Arthritis    "mostly the hands" (02/20/2012)  . Asthma   . Breast cancer ([place]) 1998   in remission  . Cataract    REMOVED  . Chronic lower back pain   . Chronic neck pain   . Complication of anesthesia    "had hard time waking up from it several times" (02/20/2012)  . Depression    "some; don't take anything for it" (02/20/2012)  . Diverticulosis   . DVT (deep venous thrombosis) ([place])   . Exertional dyspnea   . Fibromyalgia 11/2011  . GERD (gastroesophageal reflux disease)   . Graves disease   . Headache(784.0)    "related to allergies; more at different times during the year" (02/20/2012)  . Hemorrhoids   . Hiatal hernia    back and neck  . Hx of adenomatous colonic polyps 04/12/2016  . Hypercholesteremia    good cholesterol is high  . Hypothyroidism   . IBS (irritable bowel syndrome)   . Moderate persistent asthma    -FeV1 72% 2011, -IgE 102 2011, CT sinus Neg 2011  . Osteoporosis    on reclast yearly  . Pneumonia 04/2011; ~ 11/2011   "double; single" (02/20/2012)    Past Surgical History:  Procedure Laterality Date  . ANTERIOR AND POSTERIOR REPAIR  1990's  .  APPENDECTOMY    . BREAST LUMPECTOMY  1998   left  . CARPOMETACARPEL ([place]) FUSION OF THUMB WITH AUTOGRAFT FROM RADIUS  ~ 2009   "both thumbs" (02/20/2012)  . CATARACT EXTRACTION W/ INTRAOCULAR LENS  IMPLANT, BILATERAL  2012  . CERVICAL DISCECTOMY  10/2001   C5-C6  . CERVICAL FUSION  2003   C3-C4  . CHOLECYSTECTOMY    . COLONOSCOPY    . DEBRIDEMENT TENNIS ELBOW  ?1970's   right  . ESOPHAGOGASTRODUODENOSCOPY    . HYSTERECTOMY    . KNEE ARTHROPLASTY  ?1990's   "?right; w/cartilage repair" (02/20/2012)  . NASAL SEPTUM SURGERY  1980's  . POSTERIOR CERVICAL FUSION/FORAMINOTOMY  2004   "failed initial fusion; rewired  anterior neck" (02/20/2012)  . TONSILLECTOMY  ~ 1953  . VESICOVAGINAL FISTULA CLOSURE W/ TAH  1988  . VIDEO BRONCHOSCOPY Bilateral 08/23/2016   Procedure: VIDEO BRONCHOSCOPY WITH FLUORO;  Surgeon: Javier Glazier, MD;  Location: [facility];  Service: Cardiopulmonary;  Laterality: Bilateral;    There were no vitals filed for this visit.   Subjective Assessment - 04/17/18 1023    Subjective  The patient fell posteriorly on 03/02/2018  when walking with a walker.  She notes imaging in the hospital was WNLs.  She had an onset of dizziness s/p falls.  She describes sensation of room spinning that lasts for 5-10 seconds in duration.    She notes she is on steroids due to high fevers of unknown etiology.      Pertinent History  h/o falls (fall in 11/2017 and on the floor 3 days, 2 other falls --one on steps and 1 in Jan), h/o c-diff, h/o DVT, graves disease, fibromyalgia,  hyperthyroidism, rhabdomyolysis, hyperlipidemia, CHF, CKD.    Patient being worked up for [place] rheumatica per her report.      Patient Stated Goals  Reduce vertigo and lightheadedness.    Currently in Pain?  No/denies    Pain Score  --   some mild discomfort in hands.  Pain goes up to 10/10 in hands and ankles at times.        [facility] PT Assessment - 04/17/18 1031      Assessment   Medical Diagnosis   dizziness, positional vertigo    Referring Provider (PT)  Erling Cruz, MD    Onset Date/Surgical Date  --   January 2020   Prior Therapy  some therapy in acute care with multiple hospitalizations      Precautions   Precautions  Fall      Restrictions   Weight Bearing Restrictions  No      Balance Screen   Has the patient fallen in the past 6 months  Yes    How many times?  3    Has the patient had a decrease in activity level because of a fear of falling?   Yes    Is the patient reluctant to leave their home because of a fear of falling?   Yes      [employer] residence    Living Arrangements  Alone   has a caregiver   Type of [place] to enter    Entrance Stairs-Number of Steps  14   goes into house through basement; has 4 steps at front door   Entrance Stairs-Rails  Right   going up   [place] - 4 wheels    Additional Comments  Doesn't use the walker in the house.      Prior Function   Level of Independence  Independent      Cognition   Overall Cognitive Status  Impaired/Different from baseline   spells of diminished memory     Ambulation/Gait   Ambulation/Gait  Yes    Ambulation/Gait Assistance  6: Modified independent (Device/Increase time)    Ambulation Distance (Feet)  75 Feet    Assistive device  None;4-wheeled walker    Ambulation Surface  Level;Indoor    Gait Comments  PT and patient discussed mobility and she is interested in PT for vertigo only at this time.  She has HEP from home health, is walking without device indoors, and feels she is getting stronger each day.             Vestibular Assessment - 04/17/18 1037      Vestibular Assessment   General Observation  Walks into therpay with rollator RW, accompanied by her paid caregiver (6 hours/day)      Symptom Behavior   Type of Dizziness  Spinning   and lightheadedness  Frequency of Dizziness   daily    Duration of Dizziness  seconds    Aggravating Factors  Turning head quickly;Rolling to right;Rolling to left;Forward bending      Occulomotor Exam   Occulomotor Alignment  Normal    Spontaneous  Absent    Gaze-induced  Absent    Smooth Pursuits  Intact    Saccades  Intact      Vestibulo-Occular Reflex   VOR 1 Head Only (x 1 viewing)  slow VOR x 5 reps, with difficulty keeping eyes on target and pressure noted in head    Comment  Head impulse test= able to maintain fixation on target       Positional Testing   Dix-Hallpike  Dix-Hallpike Right;Dix-Hallpike Left    Horizontal Canal Testing  Horizontal Canal Right;Horizontal Canal Left      Dix-Hallpike Right   Dix-Hallpike Right Duration  15 seconds    Dix-Hallpike Right Symptoms  Upbeat, right rotatory nystagmus      Dix-Hallpike Left   Dix-Hallpike Left Duration  none    Dix-Hallpike Left Symptoms  No nystagmus      Horizontal Canal Right   Horizontal Canal Right Duration  none    Horizontal Canal Right Symptoms  Normal      Horizontal Canal Left   Horizontal Canal Left Duration  none    Horizontal Canal Left Symptoms  Normal          Objective measurements completed on examination: See above findings.       Vestibular Treatment/Exercise - 04/17/18 1500      Vestibular Treatment/Exercise   Vestibular Treatment Provided  Canalith Repositioning    Canalith Repositioning  Epley Manuever Right       EPLEY MANUEVER RIGHT   Number of Reps   2    Overall Response  Symptoms Resolved    Response Details   no nystagmus on 2nd rep            PT Education - 04/17/18 2156    Education Details  nature of BPPV    Person(s) Educated  Patient    Methods  Explanation;Handout    Comprehension  Verbalized understanding          PT Long Term Goals - 04/18/18 0998      PT LONG TERM GOAL #1   Title  The patient will have negative positional testing.    Time  4    Period  Weeks    Target Date  05/18/18       PT LONG TERM GOAL #2   Title  The patient will have HEP if needed for habituation and/or high level balance.    Time  4    Period  Weeks    Target Date  05/18/18             Plan - 04/18/18 0929    Clinical Impression Statement  The patient is a 68 year old female s/p fall 03/02/2018 resulting in room spinning vertigo.  She has positive testing for R BPPV (posterior canalithiasis) and responded well to today's treatment.  PT to f/u 1x/week as needed to work towards resolution of BPPv.     History and Personal Factors relevant to plan of care:  h/o falls (fall in 11/2017 and on the floor 3 days, 2 other falls --one on steps and 1 in Jan), h/o c-diff, h/o DVT, graves disease, fibromyalgia, hyperthyroidism, rhabdomyolysis, hyperlipidemia, CHF, CKD. Patient being worked up for [place] rheumatica per her  report    Clinical Presentation  Evolving    Clinical Presentation due to:  medical changes    Clinical Decision Making  Low   focusing on BPPV   Rehab Potential  Good    PT Frequency  1x / week    PT Duration  4 weeks    PT Treatment/Interventions  ADLs/Self Care Home Management;Therapeutic activities;Therapeutic exercise;Canalith Repostioning;Vestibular;Neuromuscular re-education;Balance training;Gait training;Stair training;Functional mobility training;Patient/family education    PT Next Visit Plan  Recheck BPPv and treat as indicated; consider providing HEP for high level balance if ready to progress from home health exercises.    Consulted and Agree with Plan of Care  Patient       Patient will benefit from skilled therapeutic intervention in order to improve the following deficits and impairments:  Dizziness, Decreased balance  Visit Diagnosis: BPPV (benign paroxysmal positional vertigo), right     Problem List Patient Active Problem List   Diagnosis Date Noted  . Clostridial gastroenteritis 03/06/2018  . Cellulitis of forearm   . Fever of unknown origin (FUO)  02/10/2018  . SIRS (systemic inflammatory response syndrome) ([place]) 02/10/2018  . Nausea and vomiting 02/10/2018  . Diarrhea 02/10/2018  . Abdominal pain 02/10/2018  . Fracture of base of fifth metatarsal bone with routine healing, left   . Closed fracture of fifth metatarsal bone of right foot, initial encounter 01/15/2018  . Clostridium difficile diarrhea 01/14/2018  . Clostridium difficile colitis 01/14/2018  . DVT (deep venous thrombosis) ([place]) 01/14/2018  . Hypothyroidism 01/14/2018  . Depression 01/14/2018  . Debility   . Acute left ankle pain   . Malnutrition of moderate degree 12/17/2017  . Knee pain   . History of breast cancer   . Chronic pain syndrome   . Fibromyalgia   . Graves disease   . Leukocytosis   . Sepsis due to undetermined organism ([place]) 12/13/2017  . Rhabdomyolysis 12/13/2017  . Acute deep vein thrombosis (DVT) of right lower extremity ([place]) 12/13/2017  . Multiple lung nodules on CT   . CKD (chronic kidney disease), stage III ([place]) 04/15/2016  . Hx of adenomatous colonic polyps 04/12/2016  . Cough 02/09/2016  . Opacity of lung on imaging study 11/03/2015  . Severe persistent asthma 02/08/2015  . Facial pain syndrome 02/08/2015  . Insomnia 02/08/2015  . Other allergic rhinitis 02/08/2015  . LPRD (laryngopharyngeal reflux disease) 02/08/2015  . Chronic diastolic CHF (congestive heart failure) ([place]) 02/08/2015  . Nausea without vomiting 04/08/2014  . Benign paroxysmal positional vertigo 12/03/2012  . Dizziness and giddiness 10/29/2012  . Pseudogout 02/24/2012  . Pneumonia 05/31/2011  . Irritable bowel syndrome 01/02/2010  . Hyperlipidemia 12/23/2009  . Hyperthyroidism 11/12/2006  . Seasonal and perennial allergic rhinitis 11/12/2006  . GERD 11/12/2006  . NECK PAIN, CHRONIC 11/12/2006  . OSTEOPOROSIS 11/12/2006  . BREAST CANCER, HX OF 11/12/2006    Stanford Strauch, PT 04/18/2018, 11:30 AM  [facility] [address] [place], [place], [address] Phone: [phone]   Fax:  [phone]  Name: TAKEYA MARQUIS MRN: [id number] Date of Birth: 23-Sep-1950

## 2018-05-06 ENCOUNTER — Encounter (HOSPITAL_COMMUNITY)
Admission: RE | Admit: 2018-05-06 | Discharge: 2018-05-06 | Disposition: A | Discharge status: Home / Self Care | Payer: Medicare Other | Source: Ambulatory Visit | Attending: Rheumatology | Admitting: Rheumatology

## 2018-05-06 ENCOUNTER — Other Ambulatory Visit: Payer: Self-pay

## 2018-05-06 ENCOUNTER — Encounter: Payer: Self-pay | Admitting: Rehabilitative and Restorative Service Providers"

## 2018-05-06 DIAGNOSIS — R509 Fever, unspecified: Secondary | ICD-10-CM

## 2018-05-06 DIAGNOSIS — Z853 Personal history of malignant neoplasm of breast: Secondary | ICD-10-CM

## 2018-05-06 DIAGNOSIS — I700 Atherosclerosis of aorta: Secondary | ICD-10-CM | POA: Diagnosis not present

## 2018-05-06 DIAGNOSIS — I2510 Atherosclerotic heart disease of native coronary artery without angina pectoris: Secondary | ICD-10-CM | POA: Diagnosis not present

## 2018-05-06 LAB — GLUCOSE, CAPILLARY: Glucose-Capillary: 84 mg/dL (ref 70–99)

## 2018-05-06 MED ORDER — FLUDEOXYGLUCOSE F - 18 (FDG) INJECTION
6.2000 | Freq: Once | INTRAVENOUS | Status: AC | PRN
  Administered 2018-05-06: 6.2 via INTRAVENOUS

## 2018-05-07 ENCOUNTER — Ambulatory Visit: Payer: Medicare Other | Admitting: Rehabilitative and Restorative Service Providers"

## 2018-05-07 ENCOUNTER — Telehealth: Payer: Self-pay | Admitting: Rehabilitative and Restorative Service Providers"

## 2018-05-07 NOTE — Telephone Encounter (Signed)
Patient called to cancel visit due to concerns re: covid 19.  PT left message for patient to call back.  She had reported that vertigo improved for a couple of days and returned.  We may be able to teach habituation over the phone for her to manage her symptoms at this time.  Emmanuela Ghazi, PT

## 2018-05-12 ENCOUNTER — Other Ambulatory Visit: Payer: Self-pay | Admitting: Family Medicine

## 2018-05-12 ENCOUNTER — Telehealth: Payer: Self-pay | Admitting: Rehabilitative and Restorative Service Providers"

## 2018-05-12 MED ORDER — GERHARDT'S BUTT CREAM: 1.0000 "application " | TOPICAL_CREAM | Freq: Four times a day (QID) | CUTANEOUS | 1 refills | Status: DC

## 2018-05-12 MED ORDER — HYDROCORTISONE 1 % RE CREA: TOPICAL_CREAM | RECTAL | 1 refills | Status: DC

## 2018-05-12 NOTE — Telephone Encounter (Signed)
Ebony Scott was contacted today regarding the temporary closing of [facility] due to Covid-19.  Patient notes she gets intermittent spinning when getting into bed.  The lightheadedness has eased up some.  She is doing well with mobility in the home.  She is only using RW when getting up to use the bathroom.    Patient is interested in further information for an e-visit, virtual check in, or telehealth visit, if those services become available.  (concerned about technology use)  [facility] will follow up with patients when we are able to resume care.  [name] [address] [name], [name]  [address] Phone:  [phone] Fax:  [phone]

## 2018-05-14 MED ORDER — RANITIDINE HCL 300 MG PO TABS: 300.0000 mg | ORAL_TABLET | Freq: Every day | ORAL | 3 refills | Status: DC

## 2018-05-14 MED ORDER — DICLOFENAC SODIUM 1 % TD GEL: 2.0000 g | Freq: Two times a day (BID) | TRANSDERMAL | 5 refills | Status: DC

## 2018-05-14 NOTE — Telephone Encounter (Signed)
Paperwork mailed, meds sent to pharm and Dr. Dennard Schaumann aware of medication changes.

## 2018-05-15 ENCOUNTER — Encounter: Payer: Self-pay | Admitting: Family Medicine

## 2018-05-16 MED FILL — GERHARDT'S BUTT CREAM BULK: 30 days supply | Qty: 60 | Fill #0

## 2018-05-20 IMAGING — CT CT CHEST W/O CM
2 of 3 series · 15 of 36 positions shown, 18 images · non-contrast
Comparison: Chest radiographs dated 09/15/2013.

CLINICAL DATA: Worsening shortness breath and cough for over the
past year. Clinical concern for nontuberculous mycobacterium
infection.

EXAM:
CT CHEST WITHOUT CONTRAST
TECHNIQUE: Multidetector CT imaging of the chest was performed following the
standard protocol without IV contrast.

[Series 2: thorax · axial · 0.71mm/px · z∈[-148,+100]mm · 12 of 146 slices shown, 15 images]
[im 11/146  mediastinal]
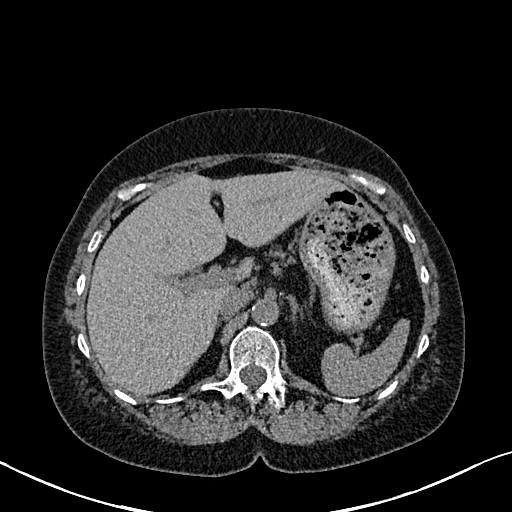
[im 11/146  lung]
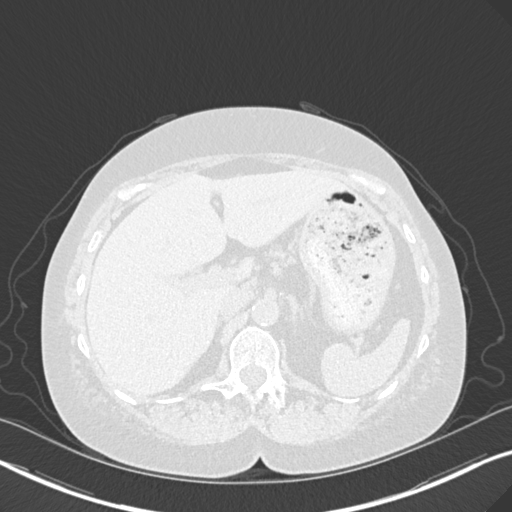
[im 22/146  lung]
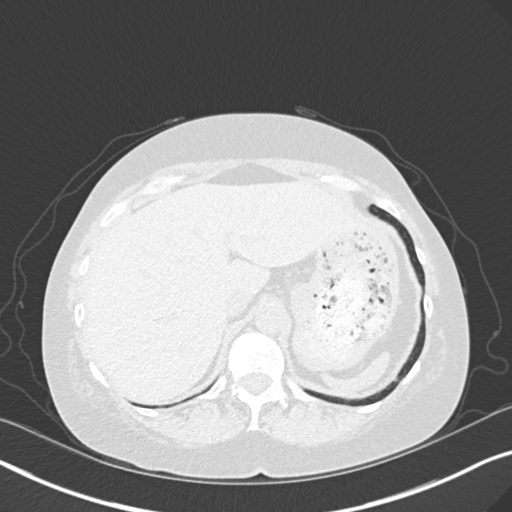
[im 33/146  lung]
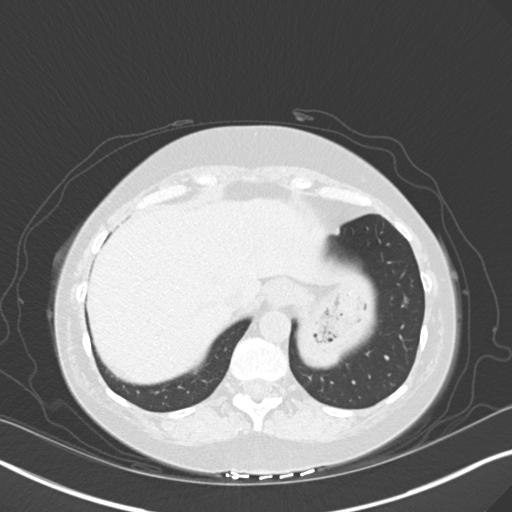
[im 43/146  lung]
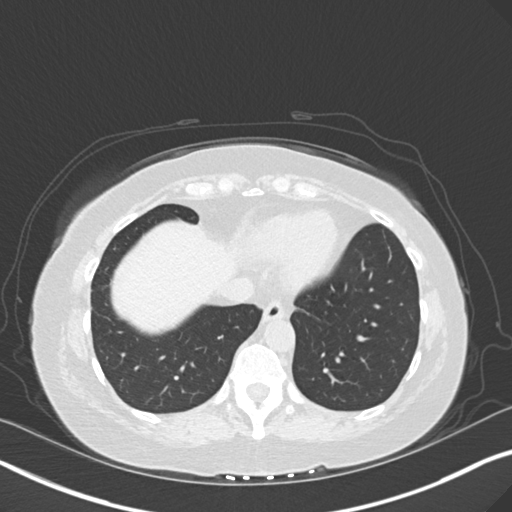
[im 54/146  mediastinal]
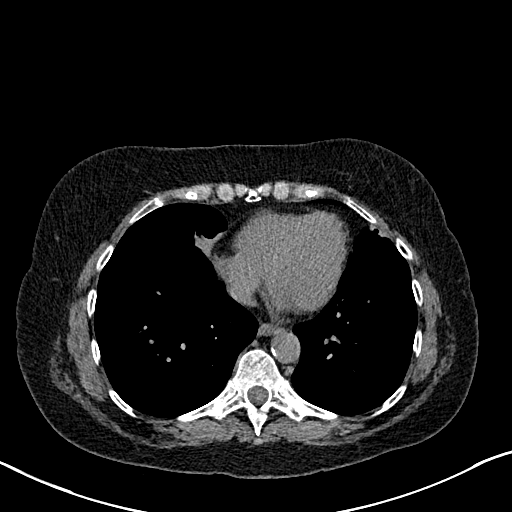
[im 54/146  lung]
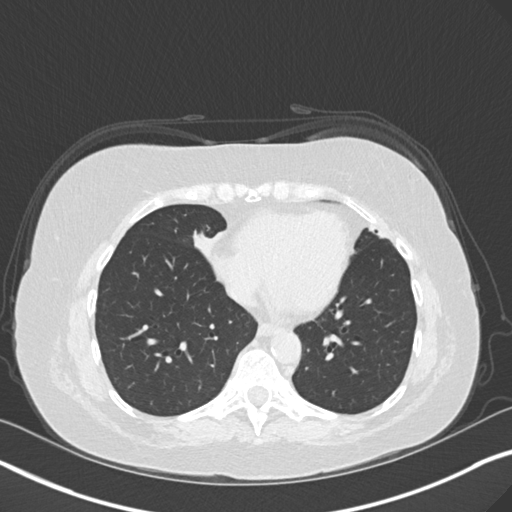
[im 65/146  lung]
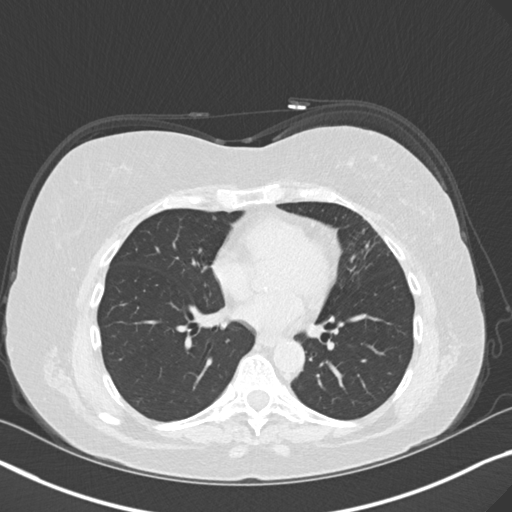
[im 81/146  lung]
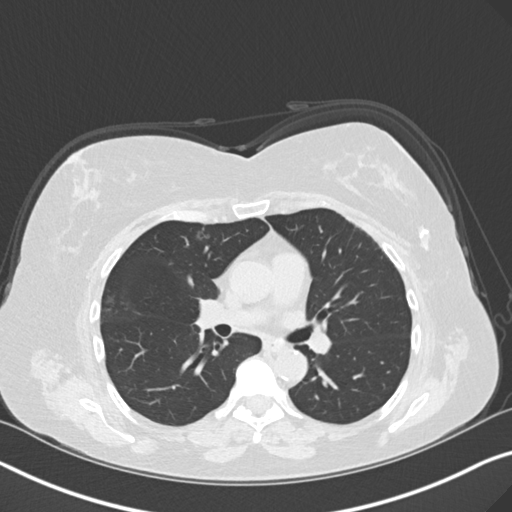
[im 92/146  lung]
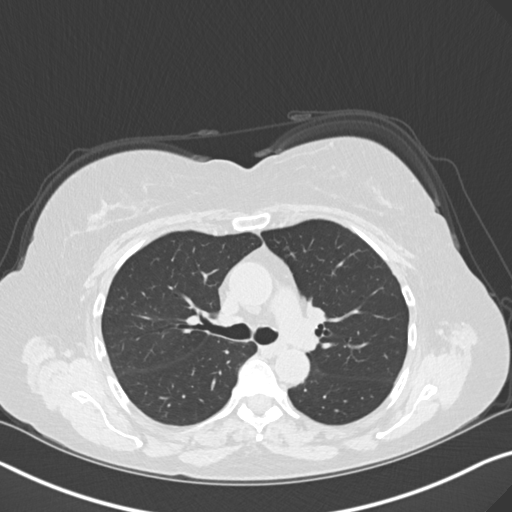
[im 103/146  mediastinal]
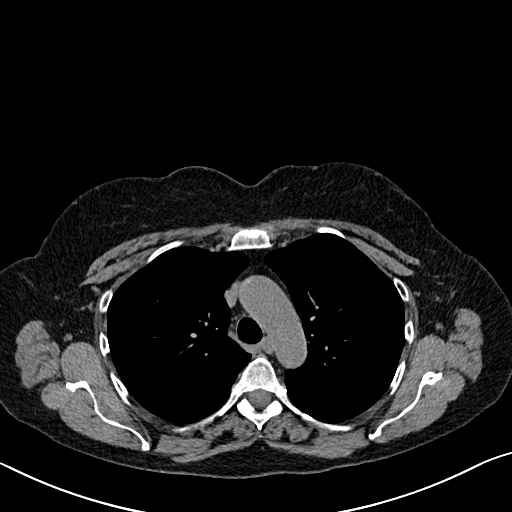
[im 103/146  lung]
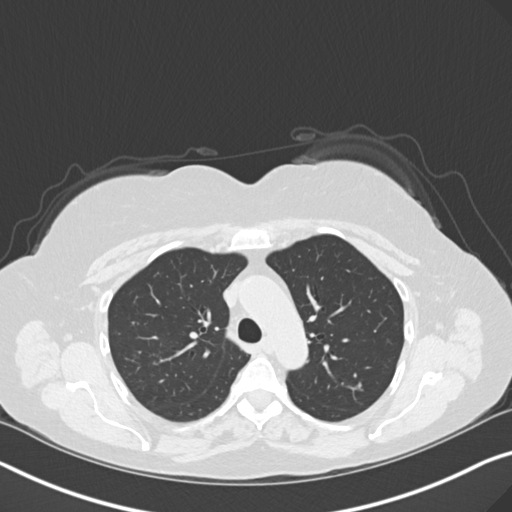
[im 113/146  lung]
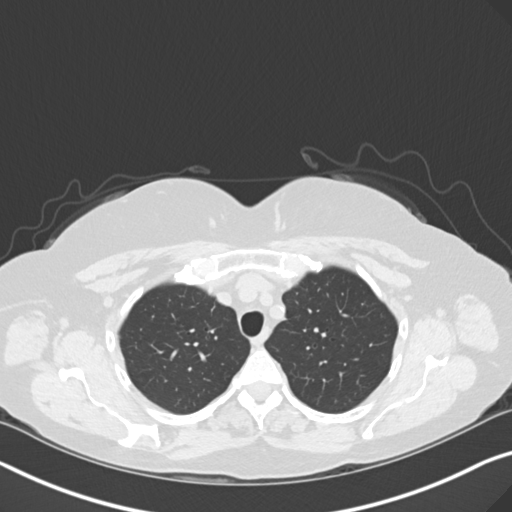
[im 124/146  lung]
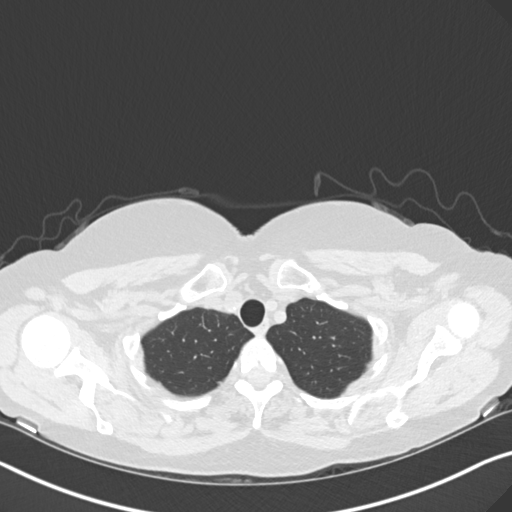
[im 135/146  lung]
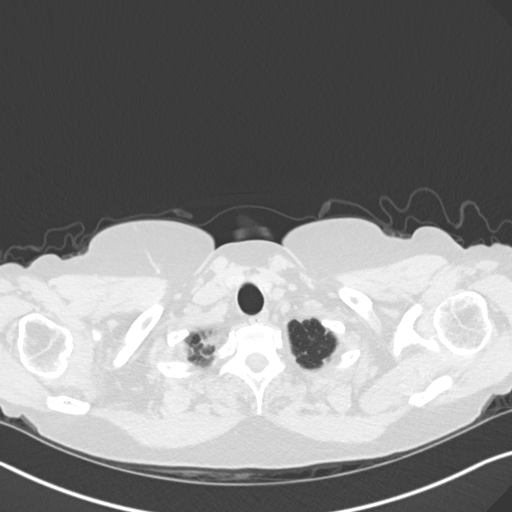

[Series 5: coronal · coronal · 0.55mm/px · 3 of 108 slices shown]
[im 22/108  lung]
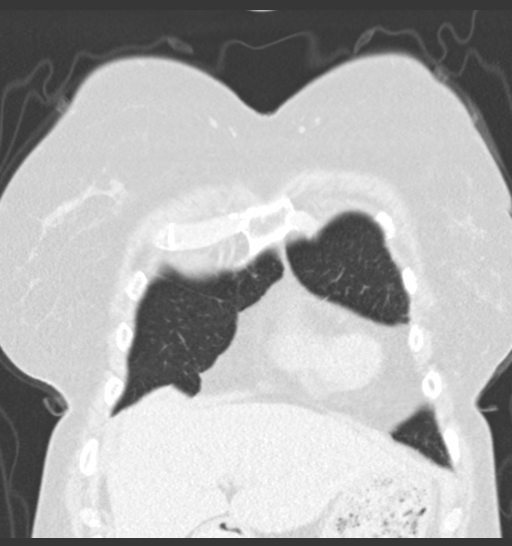
[im 43/108  lung]
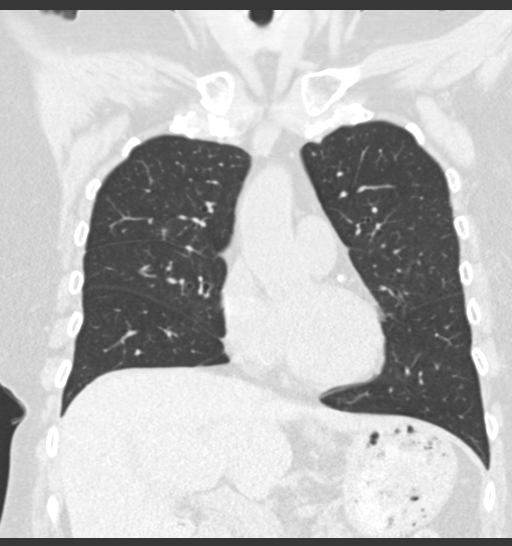
[im 65/108  lung]
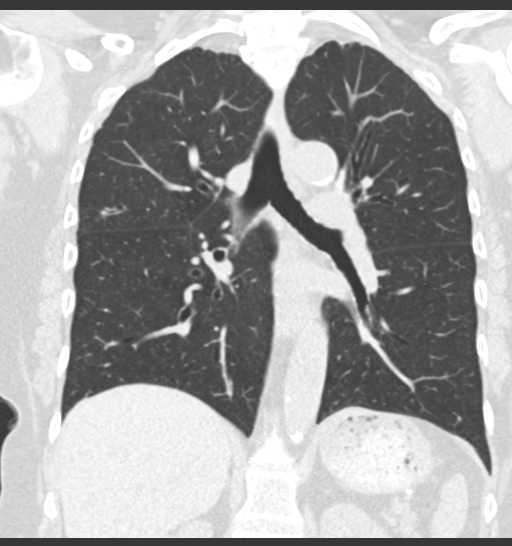

[15 of 36 positions shown; findings below may reference images not displayed]

FINDINGS: Cardiovascular: Dense coronary artery calcifications. Aortic
calcifications. Normal sized heart.

Mediastinum/Nodes: No mediastinal mass or enlarged lymph nodes.

Lungs/Pleura: Mild right middle lobe and lingular atelectasis or
scarring. Mild patchy density in the right upper lobe with a
tree-in-bud configuration. Small triangular-shaped area of scarring
in the left lower lobe. No pleural fluid.

Upper Abdomen: Cholecystectomy clips.  Aortic calcifications.

Musculoskeletal: Mild upper thoracic spine degenerative changes.
Left breast surgical scarring and clips.
IMPRESSION: 1. Mild, multifocal patchy opacity with a tree-in-bud configuration
in the right upper lobe. This is a nonspecific finding with various
forms of infectious bronchiolitis in the differential among other
causes.
2. Dense atheromatous coronary artery calcifications and aortic
atherosclerosis.

## 2018-05-22 ENCOUNTER — Other Ambulatory Visit: Payer: Self-pay

## 2018-05-23 ENCOUNTER — Telehealth: Payer: Self-pay | Admitting: Physical Therapy

## 2018-05-23 ENCOUNTER — Ambulatory Visit: Payer: Medicare Other | Admitting: Rehabilitative and Restorative Service Providers"

## 2018-05-23 ENCOUNTER — Encounter: Payer: Self-pay | Admitting: Family Medicine

## 2018-05-23 ENCOUNTER — Ambulatory Visit: Payer: Medicare Other | Admitting: Family Medicine

## 2018-05-23 VITALS — BP 132/78 | HR 80 | Temp 96.0°F | Resp 20 | Ht 62.0 in | Wt 135.0 lb

## 2018-05-23 DIAGNOSIS — R945 Abnormal results of liver function studies: Secondary | ICD-10-CM

## 2018-05-23 DIAGNOSIS — D649 Anemia, unspecified: Secondary | ICD-10-CM | POA: Diagnosis not present

## 2018-05-23 DIAGNOSIS — E7800 Pure hypercholesterolemia, unspecified: Secondary | ICD-10-CM

## 2018-05-23 DIAGNOSIS — R7989 Other specified abnormal findings of blood chemistry: Secondary | ICD-10-CM

## 2018-05-23 NOTE — Telephone Encounter (Signed)
Ebony Scott was contacted today regarding temporary reduction of [facility] due to concerns for community transmission of COVID-19.  Patient identity was verified.  Assessed if patient needed to be seen in person by clinician (recent fall or acute injury that requires hands on assessment and advice, change in diet order, post-surgical, special cases, etc.).    Patient did not have an acute/special need that requires in person visit. Proceeded with phone call.  Therapist advised the patient to continue to perform his/her HEP and assured he/she had no unanswered questions or concerns at this time.  The patient expressed interest in being contacted for an E-Visit, virtual check in, or Telehealth visit to continue their plan of care, when those services become available.  [facility] will follow up with patient at that time.   Patient is aware we can be reached by telephone during limited business hours in the meantime.  Rico Junker, PT, DPT 05/23/18    3:15 PM

## 2018-05-23 NOTE — Progress Notes (Signed)
Subjective:    Patient ID: Ebony Scott, female    DOB: Dec 28, 1950, 68 y.o.   MRN: [id number]  HPI 03/18/18 Patient is here today for hospital discharge follow-up.  I have reviewed the discharge summary from the hospital.  Patient's lab work was significant for an elevated CRP as well as an elevated sed rate greater than 140.  Patient was lethargic with fevers.  As soon as she started prednisone, her fevers resolved and she hard to feel better.  She is now down to 30 mg a day of prednisone.  She is starting to have some mild pain in the joints of her wrist but otherwise is doing much better than the way she appeared at the last visit.  The question is why she having fevers of unknown origin.  Why is her sed rate and her CRP elevated.  She is seeing rheumatology who is done a battery of blood tests which are still pending.  My suspicion is some type of atypical polymyalgia rheumatica versus an underlying bone marrow abnormality.  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  She also reports a cough today.  There are bibasilar crackles on exam however this was present at the last visit I suspect may be atelectasis.  At that time, my plan was: Patient has fever of unknown origin.  Leading suspicion is an autoimmune disease however work-up thus far has been negative.  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  She is under the care of a rheumatologist and further blood work is pending.  At the present time we will make no adjustments in her prednisone and I will defer this to her rheumatologist.  However I still question why the patient developed a DVT initially last fall prompting all the complications that arose thereafter.  This was unprovoked.  This would raise the concern for hypercoagulable state.  She is also been having fever of unknown origin.  There is also been a substantial  drop in her hemoglobin from 15 down to 7.9 since the DVT.  Now during that time she has been started on anticoagulation however her initial stool study was negative for blood.  Work-up has showed a low iron however she has been on iron replacement now for several weeks and her hemoglobin has not improved.  In fact it is dropped further.  Therefore I am going to repeat a stool study today.  If there is blood in the stool, she will need a GI evaluation for blood loss causing her anemia.  If the stool study is negative, I will repeat an iron level.  If the iron level is now normal, I would recommend an oncology hematology consultation for anemia and to evaluate for possible underlying bone marrow abnormalities that may explain the patient's fever of unknown origin, diffuse joint and bone pain, hypercoagulable state, etc.  Patient states that the rheumatologist is going to schedule her for a PET scan as well if the initial lab work is normal.  He however he is waiting for the lab work to return prior to scheduling the PET scan.  Therefore I will be interested to see the results of the PET scan as well.  She does have bibasilar crackles today on her exam however I will obtain a chest x-ray as I believe this may be atelectasis.  If there is evidence of infiltrate, I  will treat the patient for possible hospital-acquired pneumonia  05/23/18 Most recent Hgb had increased from 9.7 to 10.9 in Feb. patient is due to recheck her CBC today.  She is still taking iron replacement.  She is now completed 6 months of anticoagulation for her DVT and PE.  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  This 1 occurred unprovoked.  Therefore again we had a discussion today about discontinuing her anticoagulant versus continuing lifelong anticoagulation.  Given the risk of developing a third DVT/PE in her lifetime, the patient elects to stay on lifetime anticoagulation at this point.  She denies any rectal bleeding.  She denies any melena.   She denies any hemoptysis.  She is seeing rheumatology who is diagnosed her with an inflammatory rheumatoid arthritis despite normal lab studies.  However the patient had a profoundly elevated sedimentation rate and this is been the consensus diagnosis.  She has been gradually weaned down on prednisone and they have started methotrexate 2 weeks ago.  At the end of last year, the patient has significant elevations in her LFTs which may have been due to autoimmune inflammation possibly due to her underlying inflammatory syndrome.  However at that time we discontinued her statin.  We are due today to recheck a fasting lipid panel as she has been off her statin now for almost 3 months.  However we will need to monitor her liver function test closely prior to resuming any statin particular now that the patient is on methotrexate.  Past Medical History:  Diagnosis Date  . Allergic rhinitis   . Allergy    SEASONAL  . Anemia   . Anxiety    pt denies  . Arthritis    "mostly the hands" (02/20/2012)  . Asthma   . Breast cancer ([place]) 1998   in remission  . Cataract    REMOVED  . Chronic lower back pain   . Chronic neck pain   . Complication of anesthesia    "had hard time waking up from it several times" (02/20/2012)  . Depression    "some; don't take anything for it" (02/20/2012)  . Diverticulosis   . DVT (deep venous thrombosis) ([place])   . Exertional dyspnea   . Fibromyalgia 11/2011  . GERD (gastroesophageal reflux disease)   . Graves disease   . Headache(784.0)    "related to allergies; more at different times during the year" (02/20/2012)  . Hemorrhoids   . Hiatal hernia    back and neck  . Hx of adenomatous colonic polyps 04/12/2016  . Hypercholesteremia    good cholesterol is high  . Hypothyroidism   . IBS (irritable bowel syndrome)   . Moderate persistent asthma    -FeV1 72% 2011, -IgE 102 2011, CT sinus Neg 2011  . Osteoporosis    on reclast yearly  . Pneumonia 04/2011; ~ 11/2011    "double; single" (02/20/2012)   Past Surgical History:  Procedure Laterality Date  . ANTERIOR AND POSTERIOR REPAIR  1990's  . APPENDECTOMY    . BREAST LUMPECTOMY  1998   left  . CARPOMETACARPEL ([place]) FUSION OF THUMB WITH AUTOGRAFT FROM RADIUS  ~ 2009   "both thumbs" (02/20/2012)  . CATARACT EXTRACTION W/ INTRAOCULAR LENS  IMPLANT, BILATERAL  2012  . CERVICAL DISCECTOMY  10/2001   C5-C6  . CERVICAL FUSION  2003   C3-C4  . CHOLECYSTECTOMY    . COLONOSCOPY    . DEBRIDEMENT TENNIS ELBOW  ?1970's   right  .  ESOPHAGOGASTRODUODENOSCOPY    . HYSTERECTOMY    . KNEE ARTHROPLASTY  ?1990's   "?right; w/cartilage repair" (02/20/2012)  . NASAL SEPTUM SURGERY  1980's  . POSTERIOR CERVICAL FUSION/FORAMINOTOMY  2004   "failed initial fusion; rewired  anterior neck" (02/20/2012)  . TONSILLECTOMY  ~ 1953  . VESICOVAGINAL FISTULA CLOSURE W/ TAH  1988  . VIDEO BRONCHOSCOPY Bilateral 08/23/2016   Procedure: VIDEO BRONCHOSCOPY WITH FLUORO;  Surgeon: Javier Glazier, MD;  Location: [facility];  Service: Cardiopulmonary;  Laterality: Bilateral;   Current Outpatient Medications on File Prior to Visit  Medication Sig Dispense Refill  . acetaminophen (TYLENOL) 650 MG CR tablet Take 1,300 mg by mouth every 8 (eight) hours as needed for pain.    Marland Kitchen albuterol (PROAIR HFA) 108 (90 Base) MCG/ACT inhaler Inhale 2 puffs into the lungs every 4 (four) hours as needed for wheezing or shortness of breath. 18 g 5  . Alpha-D-Galactosidase (BEANO PO) Take 1 capsule by mouth 3 (three) times daily as needed (gas/ bloating).     . Alpha-Lipoic Acid 600 MG CAPS Take 600 mg by mouth daily.     Marland Kitchen alum & mag hydroxide-simeth (MAALOX/MYLANTA) 200-200-20 MG/5ML suspension Take 15 mLs by mouth every 4 (four) hours as needed for indigestion or heartburn. 355 mL 0  . azelastine (ASTELIN) 0.1 % nasal spray USE 2 SPRAYS IN EACH NOSTRIL DAILY AS DIRECTED 30 mL 5  . CALCIUM-MAGNESIUM PO Take 1 tablet by mouth at bedtime.    . cetirizine  (ZYRTEC) 10 MG tablet Take 5 mg by mouth every evening.     . cholecalciferol (VITAMIN D) 25 MCG (1000 UT) tablet Take 1,000 Units by mouth daily.     Marland Kitchen denosumab (PROLIA) 60 MG/ML SOLN injection Inject 60 mg into the skin every 6 (six) months. Administer in upper arm, thigh, or abdomen    . dexlansoprazole (DEXILANT) 60 MG capsule Take 1 capsule (60 mg total) by mouth every morning. 30 capsule 5  . diclofenac sodium (VOLTAREN) 1 % GEL Apply 2 g topically 2 (two) times daily. 1 Tube 5  . EPINEPHrine (EPIPEN 2-PAK) 0.3 mg/0.3 mL IJ SOAJ injection Inject 0.3 mLs (0.3 mg total) into the muscle Once PRN for up to 1 dose. 1 Device 1  . gabapentin (NEURONTIN) 600 MG tablet TAKE 1 TABLET BY MOUTH DAILY 90 tablet 3  . Hydrocortisone (GERHARDT'S BUTT CREAM) CREA Apply 1 application topically 4 (four) times daily. 60 each 1  . hydrocortisone (PROCTOCORT) 1 % CREA Gerhardts butt cream - Use as directed 60 g 1  . Lactase (LACTAID FAST ACT) 9000 units TABS Take 18,000 Units by mouth 4 (four) times daily as needed (dairy consumption).     Marland Kitchen lidocaine (LIDODERM) 5 % Place 1 patch onto the skin daily as needed (pain). Remove & Discard patch within 12 hours or as directed by MD    . montelukast (SINGULAIR) 10 MG tablet TAKE 1 TABLET BY MOUTH DAILY 30 tablet 4  . mupirocin ointment (BACTROBAN) 2 % Place 1 application into the nose at bedtime. To prevent nose bleeds    . nystatin (MYCOSTATIN) 100000 UNIT/ML suspension USE AS DIRECTED 5MLS IN MOUTH OR THROAT 4 TIMES DAILY 473 mL 1  . PRESCRIPTION MEDICATION Place 2 puffs into both ears once a week. Place capsule in dispenser and instill 2 puffs in each ear once a week after washing hair. CS (X) AHCL    CHLORAMP/SULFU/AMPHO B/ H DROC 100-100-5 mg capsule - compounded at [facility]    .  Propylene Glycol-Glycerin (SOOTHE OP) Place 1 drop into both eyes 2 (two) times daily as needed (dry eyes).     . ranitidine (ZANTAC) 300 MG tablet Take 1 tablet (300 mg total) by mouth  daily. 90 tablet 3  . Respiratory Therapy Supplies (FLUTTER) DEVI Use as directed 1 each 0  . rivaroxaban (XARELTO) 20 MG TABS tablet Take 1 tablet (20 mg total) by mouth daily with supper. 90 tablet 3  . saccharomyces boulardii (FLORASTOR) 250 MG capsule Take 1 capsule (250 mg total) by mouth 2 (two) times daily. 60 capsule 2  . SPIRIVA RESPIMAT 2.5 MCG/ACT AERS INHALE 2 PUFFS INTO THE LUNGS DAILY 4 g 4   No current facility-administered medications on file prior to visit.    Allergies  Allergen Reactions  . Dust Mite Extract Shortness Of Breath and Other (See Comments)    "sneezing" (02/20/2012)  . Molds & Smuts Shortness Of Breath  . Morphine Sulfate Itching  . Other Shortness Of Breath and Other (See Comments)    Grass and weeds "sneezing; filled sinuses" (02/20/2012)  . Penicillins Rash and Other (See Comments)    "welts" (02/20/2012) Has patient had a PCN reaction causing immediate rash, facial/tongue/throat swelling, SOB or lightheadedness with hypotension: Unknown Has patient had a PCN reaction causing severe rash involving mucus membranes or skin necrosis: No Has patient had a PCN reaction that required hospitalization: No Has patient had a PCN reaction occurring within the last 10 years: No If all of the above answers are "NO", then may proceed with Cephalosporin use.   Marland Kitchen Reclast [Zoledronic Acid] Other (See Comments)    Fever, Put in hospital, dr said it was a reaction from a reaction   . Rofecoxib Swelling    Vioxx REACTION: feet swelling  . Shrimp Flavor Anaphylaxis    ALL SHELLFISH  . Tetracycline Hcl Nausea And Vomiting  . Xolair [Omalizumab] Other (See Comments)    Caused Blood clot  . Dilaudid [Hydromorphone Hcl] Itching  . Hydrocodone-Acetaminophen Nausea And Vomiting  . Levofloxacin Other (See Comments)    REACTION: GI upset  . Oxycodone Hcl Nausea And Vomiting  . Paroxetine Nausea And Vomiting    Paxil   . Celecoxib Swelling    Feet swelling  . Diltiazem  Swelling  . Lactose Intolerance (Gi) Other (See Comments)    Bloating and gas  . Tree Extract Other (See Comments)    "tested and told I was allergic to it; never experienced a reaction to it" (02/20/2012)   Social History   Socioeconomic History  . Marital status: Divorced    Spouse name: Not on file  . Number of children: 2  . Years of education: college  . Highest education level: Not on file  Occupational History  . Occupation: Disabled    Comment: Retired Engineer, production: RETIRED  Social Needs  . Financial resource strain: Not on file  . Food insecurity:    Worry: Not on file    Inability: Not on file  . Transportation needs:    Medical: Not on file    Non-medical: Not on file  Tobacco Use  . Smoking status: Passive Smoke Exposure - Never Smoker  . Smokeless tobacco: Never Used  . Tobacco comment: Parents  Substance and Sexual Activity  . Alcohol use: Not Currently    Alcohol/week: 0.0 standard drinks  . Drug use: No  . Sexual activity: Never  Lifestyle  . Physical activity:    Days  per week: Not on file    Minutes per session: Not on file  . Stress: Not on file  Relationships  . Social connections:    Talks on phone: Not on file    Gets together: Not on file    Attends religious service: Not on file    Active member of club or organization: Not on file    Attends meetings of clubs or organizations: Not on file    Relationship status: Not on file  . Intimate partner violence:    Fear of current or ex partner: Not on file    Emotionally abused: Not on file    Physically abused: Not on file    Forced sexual activity: Not on file  Other Topics Concern  . Not on file  Social History Narrative   Patient lives at home alone. Patient  divorced.    Patient has her BS degree.   Right handed.   Caffeine- sometimes coffee.      [facility]:   Born in [place], [place]. She worked as a Copywriter, advertising. She has no pets currently. She  does have indoor plants. Previously had mold in her home that was remediated. Carpet was removed.            Review of Systems  All other systems reviewed and are negative.      Objective:   Physical Exam Vitals signs reviewed.  Constitutional:      General: She is not in acute distress.    Appearance: Normal appearance. She is well-developed. She is not ill-appearing or toxic-appearing.  HENT:     Right Ear: Tympanic membrane and ear canal normal.     Left Ear: Tympanic membrane and ear canal normal.     Nose: No congestion or rhinorrhea.     Mouth/Throat:     Mouth: Mucous membranes are dry.  Cardiovascular:     Rate and Rhythm: Normal rate and regular rhythm.     Heart sounds: Normal heart sounds.  Pulmonary:     Effort: Pulmonary effort is normal. No respiratory distress.     Breath sounds: No stridor or decreased air movement. Rales present. No wheezing.  Abdominal:     General: Bowel sounds are normal. There is no distension.     Palpations: Abdomen is soft.     Tenderness: There is no abdominal tenderness. There is no guarding.  Skin:    Findings: No erythema.  Neurological:     Mental Status: She is alert and oriented to person, place, and time.     Cranial Nerves: No cranial nerve deficit.     Sensory: No sensory deficit.     Motor: No weakness.     Coordination: Coordination normal.     Patient has what appears to be a plantars wart on the medial aspect of her right second toe between the PIP and DIP joints.  She has been treating this with an over-the-counter wart remover.  The majority of the wart has dissolved however there is some residual hyperkeratotic tissue around the periphery.  This was removed bluntly with a razor blade and then treated with liquid nitrogen cryotherapy for a total of 30 seconds.  The wound was then dressed with Neosporin and a Band-Aid      Assessment & Plan:   Elevated LFTs - Plan: CBC with Differential/Platelet, COMPLETE  METABOLIC PANEL WITH GFR, Lipid panel  Anemia, unspecified type - Plan: CBC with Differential/Platelet, COMPLETE METABOLIC PANEL WITH GFR, Lipid panel  Pure hypercholesterolemia - Plan: CBC with Differential/Platelet, COMPLETE METABOLIC PANEL WITH GFR, Lipid panel  Patient had elevated liver function test that were never truly determine what the source is.  She is off her statin.  I suspect that there may have been some autoimmune hepatitis possibly causing the elevated LFTs.  Therefore I will repeat a CMP today.  If her liver function test are stable or better on the methotrexate, I would consider resuming her statin particular if her LDL cholesterol is elevated.  I will monitor her anemia with a CBC and if her hemoglobin has normalized, we will discontinue iron.  I will also check her cholesterol as stated above.  Her thyroid was just checked in February and was normal.  I would repeat that in August.  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  Therefore we will monitor her hemoglobin closely as she will continue her anticoagulant indefinitely.  As stated in the history and physical, her wart on her right second toe was treated with liquid nitrogen cryotherapy.

## 2018-05-24 LAB — CBC WITH DIFFERENTIAL/PLATELET
Absolute Monocytes: 1459 cells/uL — ABNORMAL HIGH (ref 200–950)
Basophils Absolute: 102 cells/uL (ref 0–200)
Basophils Relative: 0.8 %
Eosinophils Absolute: 435 cells/uL (ref 15–500)
Eosinophils Relative: 3.4 %
HCT: 37.1 % (ref 35.0–45.0)
Hemoglobin: 12.4 g/dL (ref 11.7–15.5)
Lymphs Abs: 3136 cells/uL (ref 850–3900)
MCH: 33 pg (ref 27.0–33.0)
MCHC: 33.4 g/dL (ref 32.0–36.0)
MCV: 98.7 fL (ref 80.0–100.0)
MPV: 10.7 fL (ref 7.5–12.5)
Monocytes Relative: 11.4 %
Neutro Abs: 7667 cells/uL (ref 1500–7800)
Neutrophils Relative %: 59.9 %
Platelets: 294 10*3/uL (ref 140–400)
RBC: 3.76 10*6/uL — ABNORMAL LOW (ref 3.80–5.10)
RDW: 14.7 % (ref 11.0–15.0)
Total Lymphocyte: 24.5 %
WBC: 12.8 10*3/uL — ABNORMAL HIGH (ref 3.8–10.8)

## 2018-05-24 LAB — COMPLETE METABOLIC PANEL WITH GFR
AG Ratio: 1.5 (calc) (ref 1.0–2.5)
ALT: 19 U/L (ref 6–29)
AST: 18 U/L (ref 10–35)
Albumin: 3.7 g/dL (ref 3.6–5.1)
Alkaline phosphatase (APISO): 34 U/L — ABNORMAL LOW (ref 37–153)
BUN/Creatinine Ratio: 24 (calc) — ABNORMAL HIGH (ref 6–22)
BUN: 31 mg/dL — ABNORMAL HIGH (ref 7–25)
CO2: 26 mmol/L (ref 20–32)
Calcium: 9.8 mg/dL (ref 8.6–10.4)
Chloride: 105 mmol/L (ref 98–110)
Creat: 1.29 mg/dL — ABNORMAL HIGH (ref 0.60–0.93)
GFR, Est African American: 49 mL/min/{1.73_m2} — ABNORMAL LOW (ref >=60)
GFR, Est Non African American: 42 mL/min/{1.73_m2} — ABNORMAL LOW (ref >=60)
Globulin: 2.4 g/dL (calc) (ref 1.9–3.7)
Glucose, Bld: 82 mg/dL (ref 65–99)
Potassium: 4.3 mmol/L (ref 3.5–5.3)
Sodium: 143 mmol/L (ref 135–146)
Total Bilirubin: 0.3 mg/dL (ref 0.2–1.2)
Total Protein: 6.1 g/dL (ref 6.1–8.1)

## 2018-05-24 LAB — LIPID PANEL
Cholesterol: 294 mg/dL — ABNORMAL HIGH (ref <200)
HDL: 77 mg/dL (ref >=50)
LDL Cholesterol (Calc): 176 mg/dL (calc) — ABNORMAL HIGH
Non-HDL Cholesterol (Calc): 217 mg/dL (calc) — ABNORMAL HIGH (ref <130)
Total CHOL/HDL Ratio: 3.8 (calc) (ref <5.0)
Triglycerides: 250 mg/dL — ABNORMAL HIGH (ref <150)

## 2018-05-27 MED ORDER — ATORVASTATIN CALCIUM 10 MG PO TABS: 10.0000 mg | ORAL_TABLET | Freq: Every day | ORAL | 3 refills | Status: DC

## 2018-05-28 ENCOUNTER — Encounter: Payer: Self-pay | Admitting: Rehabilitative and Restorative Service Providers"

## 2018-05-30 MED FILL — GERHARDT'S BUTT CREAM BULK: 30 days supply | Qty: 60 | Fill #1

## 2018-06-05 ENCOUNTER — Ambulatory Visit: Payer: Medicare Other | Attending: Family Medicine | Admitting: Rehabilitative and Restorative Service Providers"

## 2018-06-05 DIAGNOSIS — R2689 Other abnormalities of gait and mobility: Secondary | ICD-10-CM | POA: Insufficient documentation

## 2018-06-05 DIAGNOSIS — H8111 Benign paroxysmal vertigo, right ear: Secondary | ICD-10-CM | POA: Insufficient documentation

## 2018-06-05 DIAGNOSIS — R42 Dizziness and giddiness: Secondary | ICD-10-CM | POA: Diagnosis present

## 2018-06-05 DIAGNOSIS — R2681 Unsteadiness on feet: Secondary | ICD-10-CM

## 2018-06-05 NOTE — Patient Instructions (Signed)
Access Code: [email or web address]  URL: [email or web address]  Date: 06/05/2018  Prepared by: Rudell Cobb   Program Notes  To be Safe: Go to the bathroom before you begin and have your phone nearby. Place your walker in front of you or beside the bed. After the exercises, sit and rest for 5-10 minutes to make sure dizziness and lightheadedness settle.   Exercises Right Side Lying to Left Side Lying Vestibular Habituation - 5 reps - 1 sets - 2x daily - 7x weekly Seated to Fold Over Vestibular Habituation - 5 reps - 1 sets - 2x daily - 7x weekly

## 2018-06-05 NOTE — Therapy (Signed)
[facility] [address] [place], [place], [address] Phone: [phone]   Fax:  [phone]  Physical Therapy Treatment  Patient Details  Name: Ebony Scott MRN: [id number] Date of Birth: Jul 10, 1950 Referring Provider (PT): Erling Cruz, MD   Encounter Date: 06/05/2018  PT End of Session - 06/05/18 1157    Visit Number  2    Number of Visits  4    Date for PT Re-Evaluation  07/05/18    Authorization Type  [employer] medicare    PT Start Time  1100    PT Stop Time  1200    PT Time Calculation (min)  60 min    Activity Tolerance  Patient tolerated treatment well    Behavior During Therapy  [facility] for tasks assessed/performed       Past Medical History:  Diagnosis Date  . Allergic rhinitis   . Allergy    SEASONAL  . Anemia   . Anxiety    pt denies  . Arthritis    "mostly the hands" (02/20/2012)  . Asthma   . Breast cancer ([place]) 1998   in remission  . Cataract    REMOVED  . Chronic lower back pain   . Chronic neck pain   . Complication of anesthesia    "had hard time waking up from it several times" (02/20/2012)  . Depression    "some; don't take anything for it" (02/20/2012)  . Diverticulosis   . DVT (deep venous thrombosis) ([place])   . Exertional dyspnea   . Fibromyalgia 11/2011  . GERD (gastroesophageal reflux disease)   . Graves disease   . Headache(784.0)    "related to allergies; more at different times during the year" (02/20/2012)  . Hemorrhoids   . Hiatal hernia    back and neck  . Hx of adenomatous colonic polyps 04/12/2016  . Hypercholesteremia    good cholesterol is high  . Hypothyroidism   . IBS (irritable bowel syndrome)   . Moderate persistent asthma    -FeV1 72% 2011, -IgE 102 2011, CT sinus Neg 2011  . Osteoporosis    on reclast yearly  . Pneumonia 04/2011; ~ 11/2011   "double; single" (02/20/2012)    Past Surgical History:  Procedure Laterality Date  . ANTERIOR AND POSTERIOR REPAIR  1990's  .  APPENDECTOMY    . BREAST LUMPECTOMY  1998   left  . CARPOMETACARPEL ([place]) FUSION OF THUMB WITH AUTOGRAFT FROM RADIUS  ~ 2009   "both thumbs" (02/20/2012)  . CATARACT EXTRACTION W/ INTRAOCULAR LENS  IMPLANT, BILATERAL  2012  . CERVICAL DISCECTOMY  10/2001   C5-C6  . CERVICAL FUSION  2003   C3-C4  . CHOLECYSTECTOMY    . COLONOSCOPY    . DEBRIDEMENT TENNIS ELBOW  ?1970's   right  . ESOPHAGOGASTRODUODENOSCOPY    . HYSTERECTOMY    . KNEE ARTHROPLASTY  ?1990's   "?right; w/cartilage repair" (02/20/2012)  . NASAL SEPTUM SURGERY  1980's  . POSTERIOR CERVICAL FUSION/FORAMINOTOMY  2004   "failed initial fusion; rewired  anterior neck" (02/20/2012)  . TONSILLECTOMY  ~ 1953  . VESICOVAGINAL FISTULA CLOSURE W/ TAH  1988  . VIDEO BRONCHOSCOPY Bilateral 08/23/2016   Procedure: VIDEO BRONCHOSCOPY WITH FLUORO;  Surgeon: Javier Glazier, MD;  Location: [facility];  Service: Cardiopulmonary;  Laterality: Bilateral;    There were no vitals filed for this visit.  Subjective Assessment - 06/05/18 1056    Subjective  The patient notes that she had improvement  but the lightheadedness seemed worse after initial evaluation.  She notes I just felt weird.  (Pended)     Pertinent History  h/o falls (fall in 11/2017 and on the floor 3 days, 2 other falls --one on steps and 1 in Jan), h/o c-diff, h/o DVT, graves disease, fibromyalgia,  hyperthyroidism, rhabdomyolysis, hyperlipidemia, CHF, CKD.    Patient being worked up for [place] rheumatica per her report.    (Pended)     Patient Stated Goals  Reduce vertigo and lightheadedness.  (Pended)     Currently in Pain?  No/denies  (Pended)           Physical Therapy Telehealth Visit:  I connected with Ebony Scott today at 1100 by Webex video conference and verified that I am speaking with the correct person using two identifiers.  I discussed the limitations, risks, security and privacy concerns of performing an evaluation and management service by Webex and the  availability of in person appointments.  I also discussed with the patient that there may be a patient responsible charge related to this service. The patient expressed understanding and agreed to proceed.    The patient's address was confirmed.  Identified to the patient that therapist is a licensed [place] in the state of [place].  Verified phone # as [phone] to call in case of technical difficulties.       Vestibular Assessment - 06/05/18 1120      Vestibular Assessment   General Observation  Patient is ambulating around her home without walker.  PT encourages her to get walker nearby before treatment in order to have if vertigo is provoked.      Symptom Behavior   Type of Dizziness   Spinning   lightheadedness   Frequency of Dizziness  daily    Duration of Dizziness  seconds    Symptom Nature  Motion provoked    Aggravating Factors  Turning body quickly;Turning head quickly;Sit to stand;Forward bending;Rolling to right;Rolling to left;Looking up to the ceiling      Positional Testing   Sidelying Test  Sidelying Right;Sidelying Left    Horizontal Canal Testing  Horizontal Canal Right;Horizontal Canal Left      Sidelying Right   Sidelying Right Duration  no subjective reports    Sidelying Right Symptoms  --   not viewed-treating by telehealth     Sidelying Left   Sidelying Left Duration  no subjective reports    Sidelying Left Symptoms  --   not viewed-treating by telehealth     Horizontal Canal Right   Horizontal Canal Right Duration  initialy reported "no dizziness,just lightheaded"  "a weird feeling"    Horizontal Canal Right Symptoms  --   not viewed-treating by telehealth     Horizontal Canal Left   Horizontal Canal Left Duration  negative    Horizontal Canal Left Symptoms  --   not viewed-treating by telehealth     Orthostatics   BP supine (x 5 minutes)  133/68   patient took after supine 5 minutes with home cuff   HR supine (x 5 minutes)  86    BP sitting   149/75   then patient took again and it was 129/65   HR sitting  75    BP standing (after 1 minute)  132/75   no lightheadedness   HR standing (after 1 minute)  78    Orthostatics Comment  Bending to floor and return to stand with BP= 140/75, 79 bpm.  [facility] Adult PT Treatment/Exercise - 06/05/18 1152      Self-Care   Self-Care  Other Self-Care Comments    Other Self-Care Comments   discussed lightheadedness with patient noting sometime occuring when looking down or up, bending to rinse mouth after brushing teeth, and when rising after being seated.  Patient had home blood pressure cuff, PT had patient assess for postural hypotension (see vestibular flow sheet).  Discussed importance of hydration.  Reviewed safety when performing HEP alone at home due to fall risk.      Vestibular Treatment/Exercise - 06/05/18 1119      Vestibular Treatment/Exercise   Vestibular Treatment Provided  Habituation;Gaze    Habituation Exercises  Horizontal Roll;Seated Vertical Head Turns;Standing Vertical Head Turns;Standing Horizontal Head Turns    Gaze Exercises  X1 Viewing Horizontal      Horizontal Roll   Number of Reps   5    Symptom Description   First rep to the L, patient notes window moves (to the right) indicating possible ageotropic nystagmus; first rep to the right, notes a "spinning all around" described as a rotational spin that lasts <10 seconds.  Repeated with improvement to the left (no dizziness by 4th rep); R side patient has increased symptoms, but no dizziness by 4th rep.  Continues with a lightheaded sensation.      Seated Vertical Head Turns   Number of Reps   5    Symptom Description   no increase in symptoms      Standing Horizontal Head Turns   Number of Reps   5    Symptom Description   No dizziness :  performed in a corner with back near wall but no UE support.        Standing Vertical Head Turns   Number of Reps   5    Symptom Description   Mild sway  with eyes open, no dizziness or lightrheadedness provoked.      X1 Viewing Horizontal   Foot Position  seated    Comments  Performed with PT viewing via phone camera.  Patient is able to tolerate brisk pace x 20 seconds without provoking any dizziness or lightheadedness or visual blurring.  Did not provide for HEP today as patient has improved with this task since evaluation.             PT Education - 06/05/18 1157    Education Details  HEP for habituation    Person(s) Educated  Patient    Methods  Explanation;Demonstration;Handout    Comprehension  Verbalized understanding;Returned demonstration          PT Long Term Goals - 06/05/18 1157      PT LONG TERM GOAL #1   Title  The patient will have negative positional testing.    Time  4    Period  Weeks    Target Date  07/05/18      PT LONG TERM GOAL #2   Title  The patient will have HEP if needed for habituation and/or high level balance.    Time  4    Period  Weeks    Target Date  07/05/18            Plan - 06/05/18 2156    Clinical Impression Statement  The patient was evaluated by physical therapy 04/10/2018, then went to visit a family member. Due to Covid-19, she is not going to be seen in the clinic.  Therefore, we were able to offer a  telehealth visit today.  The patient continues with BPPV with R horizontal roll as the most provoking posiition.  PT educated patient on habituation exercises and provided e-mailed HEP copy.  PT to f/u by telehealth 1x/week for 4 weeks to progress exercises.     PT Frequency  4x / week    PT Treatment/Interventions  ADLs/Self Care Home Management;Therapeutic activities;Therapeutic exercise;Canalith Repostioning;Vestibular;Neuromuscular re-education;Balance training;Gait training;Stair training;Functional mobility training;Patient/family education    PT Next Visit Plan  Recheck BPPv and treat as indicated; consider providing HEP for high level balance if ready to progress from home  health exercises.    Consulted and Agree with Plan of Care  Patient       Patient will benefit from skilled therapeutic intervention in order to improve the following deficits and impairments:  Dizziness, Decreased balance  Visit Diagnosis: BPPV (benign paroxysmal positional vertigo), right  Other abnormalities of gait and mobility  Unsteadiness on feet  Dizziness and giddiness     Problem List Patient Active Problem List   Diagnosis Date Noted  . Clostridial gastroenteritis 03/06/2018  . Cellulitis of forearm   . Fever of unknown origin (FUO) 02/10/2018  . SIRS (systemic inflammatory response syndrome) ([place]) 02/10/2018  . Nausea and vomiting 02/10/2018  . Diarrhea 02/10/2018  . Abdominal pain 02/10/2018  . Fracture of base of fifth metatarsal bone with routine healing, left   . Closed fracture of fifth metatarsal bone of right foot, initial encounter 01/15/2018  . Clostridium difficile diarrhea 01/14/2018  . Clostridium difficile colitis 01/14/2018  . DVT (deep venous thrombosis) ([place]) 01/14/2018  . Hypothyroidism 01/14/2018  . Depression 01/14/2018  . Debility   . Acute left ankle pain   . Malnutrition of moderate degree 12/17/2017  . Knee pain   . History of breast cancer   . Chronic pain syndrome   . Fibromyalgia   . Graves disease   . Leukocytosis   . Sepsis due to undetermined organism ([place]) 12/13/2017  . Rhabdomyolysis 12/13/2017  . Acute deep vein thrombosis (DVT) of right lower extremity ([place]) 12/13/2017  . Multiple lung nodules on CT   . CKD (chronic kidney disease), stage III ([place]) 04/15/2016  . Hx of adenomatous colonic polyps 04/12/2016  . Cough 02/09/2016  . Opacity of lung on imaging study 11/03/2015  . Severe persistent asthma 02/08/2015  . Facial pain syndrome 02/08/2015  . Insomnia 02/08/2015  . Other allergic rhinitis 02/08/2015  . LPRD (laryngopharyngeal reflux disease) 02/08/2015  . Chronic diastolic CHF (congestive heart failure) ([place])  02/08/2015  . Nausea without vomiting 04/08/2014  . Benign paroxysmal positional vertigo 12/03/2012  . Dizziness and giddiness 10/29/2012  . Pseudogout 02/24/2012  . Pneumonia 05/31/2011  . Irritable bowel syndrome 01/02/2010  . Hyperlipidemia 12/23/2009  . Hyperthyroidism 11/12/2006  . Seasonal and perennial allergic rhinitis 11/12/2006  . GERD 11/12/2006  . NECK PAIN, CHRONIC 11/12/2006  . OSTEOPOROSIS 11/12/2006  . BREAST CANCER, HX OF 11/12/2006    Aerik Polan , PT 06/05/2018, 10:01 PM  [facility] [address] [place], [place], [address] Phone: [phone]   Fax:  [phone]  Name: Ebony Scott MRN: [id number] Date of Birth: June 01, 1950

## 2018-06-06 ENCOUNTER — Telehealth: Payer: Self-pay | Admitting: Allergy and Immunology

## 2018-06-06 MED ORDER — FAMOTIDINE 20 MG PO TABS: 20.0000 mg | ORAL_TABLET | Freq: Two times a day (BID) | ORAL | 5 refills | Status: DC

## 2018-06-06 NOTE — Telephone Encounter (Signed)
Patient got a letter from [facility] stating that ranitidine is off the market and she would need to get an alternative medication. [facility].

## 2018-06-06 NOTE — Telephone Encounter (Signed)
Pepcid 20mg  bid sent to pharmacy.

## 2018-06-09 ENCOUNTER — Ambulatory Visit: Payer: Self-pay | Admitting: Rehabilitative and Restorative Service Providers"

## 2018-06-09 ENCOUNTER — Encounter: Payer: Self-pay | Admitting: Rehabilitative and Restorative Service Providers"

## 2018-06-09 ENCOUNTER — Ambulatory Visit: Payer: Medicare Other | Admitting: Rehabilitative and Restorative Service Providers"

## 2018-06-09 DIAGNOSIS — R2681 Unsteadiness on feet: Secondary | ICD-10-CM

## 2018-06-09 DIAGNOSIS — H8111 Benign paroxysmal vertigo, right ear: Secondary | ICD-10-CM

## 2018-06-09 NOTE — Therapy (Signed)
[facility] [address] [place], [place], [address] Phone: [phone]   Fax:  [phone]  Physical Therapy Treatment  Patient Details  Name: Ebony Scott MRN: [id number] Date of Birth: July 28, 1950 Referring Provider (PT): Erling Cruz, MD   Encounter Date: 06/09/2018  PT End of Session - 06/09/18 1724    Visit Number  3    Number of Visits  4    Date for PT Re-Evaluation  07/05/18    Authorization Type  UHC medicare    PT Start Time  1250    PT Stop Time  1313    PT Time Calculation (min)  23 min    Activity Tolerance  Patient tolerated treatment well    Behavior During Therapy  [facility] for tasks assessed/performed       Past Medical History:  Diagnosis Date  . Allergic rhinitis   . Allergy    SEASONAL  . Anemia   . Anxiety    pt denies  . Arthritis    "mostly the hands" (02/20/2012)  . Asthma   . Breast cancer ([place]) 1998   in remission  . Cataract    REMOVED  . Chronic lower back pain   . Chronic neck pain   . Complication of anesthesia    "had hard time waking up from it several times" (02/20/2012)  . Depression    "some; don't take anything for it" (02/20/2012)  . Diverticulosis   . DVT (deep venous thrombosis) ([place])   . Exertional dyspnea   . Fibromyalgia 11/2011  . GERD (gastroesophageal reflux disease)   . Graves disease   . Headache(784.0)    "related to allergies; more at different times during the year" (02/20/2012)  . Hemorrhoids   . Hiatal hernia    back and neck  . Hx of adenomatous colonic polyps 04/12/2016  . Hypercholesteremia    good cholesterol is high  . Hypothyroidism   . IBS (irritable bowel syndrome)   . Moderate persistent asthma    -FeV1 72% 2011, -IgE 102 2011, CT sinus Neg 2011  . Osteoporosis    on reclast yearly  . Pneumonia 04/2011; ~ 11/2011   "double; single" (02/20/2012)    Past Surgical History:  Procedure Laterality Date  . ANTERIOR AND POSTERIOR REPAIR  1990's  .  APPENDECTOMY    . BREAST LUMPECTOMY  1998   left  . CARPOMETACARPEL ([place]) FUSION OF THUMB WITH AUTOGRAFT FROM RADIUS  ~ 2009   "both thumbs" (02/20/2012)  . CATARACT EXTRACTION W/ INTRAOCULAR LENS  IMPLANT, BILATERAL  2012  . CERVICAL DISCECTOMY  10/2001   C5-C6  . CERVICAL FUSION  2003   C3-C4  . CHOLECYSTECTOMY    . COLONOSCOPY    . DEBRIDEMENT TENNIS ELBOW  ?1970's   right  . ESOPHAGOGASTRODUODENOSCOPY    . HYSTERECTOMY    . KNEE ARTHROPLASTY  ?1990's   "?right; w/cartilage repair" (02/20/2012)  . NASAL SEPTUM SURGERY  1980's  . POSTERIOR CERVICAL FUSION/FORAMINOTOMY  2004   "failed initial fusion; rewired  anterior neck" (02/20/2012)  . TONSILLECTOMY  ~ 1953  . VESICOVAGINAL FISTULA CLOSURE W/ TAH  1988  . VIDEO BRONCHOSCOPY Bilateral 08/23/2016   Procedure: VIDEO BRONCHOSCOPY WITH FLUORO;  Surgeon: Javier Glazier, MD;  Location: [facility];  Service: Cardiopulmonary;  Laterality: Bilateral;    There were no vitals filed for this visit.  Subjective Assessment - 06/09/18 1534    Subjective  The patient notes rolling is no longer  provoking symptoms.  She is also able to bend forward without dizziness.  She noted when first going to bed last night, no dizziness when getting into bed, however felt symptoms when getting up in the middle of the night to use the restroom.    Pertinent History  h/o falls (fall in 11/2017 and on the floor 3 days, 2 other falls --one on steps and 1 in Jan), h/o c-diff, h/o DVT, graves disease, fibromyalgia,  hyperthyroidism, rhabdomyolysis, hyperlipidemia, CHF, CKD.    Patient being worked up for [place] rheumatica per her report.      Patient Stated Goals  Reduce vertigo and lightheadedness.    Currently in Pain?  No/denies         Physical Therapy Telehealth Visit:  I connected with Ebony Scott today at 1250 (time) by [employer] and verified that I am speaking with the correct person using two identifiers.  I discussed the  limitations, risks, security and privacy concerns of performing an evaluation and management service by Webex and the availability of in person appointments.  I also discussed with the patient that there may be a patient responsible charge related to this service. The patient expressed understanding and agreed to proceed.    The patient's address was confirmed.  Identified to the patient that therapist is a licensed physical therapist in the state of [place].  Verified phone # as *same as in epic* to call in case of technical difficulties.  *PT had to work with patient to get onto webex (that time not included in charge).  NEUROMUSCULAR RE-EDUCATION: Reviewed horizontal rolling with no symptoms today.  Performed sitting to R sidelying with seconds of vertigo on first repetition.  On 2nd and 3rd rep, she noted no symptoms of dizziness.   Corner standing with horizontal head motion with eyes closed. Single leg stance near support surface x 5 second intervals, unable to perform 10 seconds without UE support.  The patient notes she feels her balance is functional and endurance is more limiting.   SELF CARE/HOME MANAGEMENT: The patient notes that she has added rugs into home as her balance is improving (she had removed during home health therapy).  She is negotiating steps without handrails 4-5 times per day.  She notes endurance is limiting reporting "huffing and puffing" with activity.  PT and patient discussed progression of walking, working up to 10 minutes of nonstop activity.  Recommended laps in her home for now due to [employer].            PT Long Term Goals - 06/05/18 1157      PT LONG TERM GOAL #1   Title  The patient will have negative positional testing.    Time  4    Period  Weeks    Target Date  07/05/18      PT LONG TERM GOAL #2   Title  The patient will have HEP if needed for habituation and/or high level balance.    Time  4    Period  Weeks    Target Date   07/05/18            Plan - 06/09/18 1725    Clinical Impression Statement  The patient notes that horizontal rolling and forward bending no longer provoke the same intensity of symptoms.  She does have increased symptoms today with R sidelying. With habituation, the patient's symptoms improve.   PT continued to progress balance and habituation for home management.  PT Treatment/Interventions  ADLs/Self Care Home Management;Therapeutic activities;Therapeutic exercise;Canalith Repostioning;Vestibular;Neuromuscular re-education;Balance training;Gait training;Stair training;Functional mobility training;Patient/family education    PT Next Visit Plan  Check habituation and high level balance HEP.     Consulted and Agree with Plan of Care  Patient       Patient will benefit from skilled therapeutic intervention in order to improve the following deficits and impairments:  Dizziness, Decreased balance  Visit Diagnosis: BPPV (benign paroxysmal positional vertigo), right  Unsteadiness on feet     Problem List Patient Active Problem List   Diagnosis Date Noted  . Clostridial gastroenteritis 03/06/2018  . Cellulitis of forearm   . Fever of unknown origin (FUO) 02/10/2018  . SIRS (systemic inflammatory response syndrome) ([place]) 02/10/2018  . Nausea and vomiting 02/10/2018  . Diarrhea 02/10/2018  . Abdominal pain 02/10/2018  . Fracture of base of fifth metatarsal bone with routine healing, left   . Closed fracture of fifth metatarsal bone of right foot, initial encounter 01/15/2018  . Clostridium difficile diarrhea 01/14/2018  . Clostridium difficile colitis 01/14/2018  . DVT (deep venous thrombosis) ([place]) 01/14/2018  . Hypothyroidism 01/14/2018  . Depression 01/14/2018  . Debility   . Acute left ankle pain   . Malnutrition of moderate degree 12/17/2017  . Knee pain   . History of breast cancer   . Chronic pain syndrome   . Fibromyalgia   . Graves disease   . Leukocytosis   .  Sepsis due to undetermined organism ([place]) 12/13/2017  . Rhabdomyolysis 12/13/2017  . Acute deep vein thrombosis (DVT) of right lower extremity ([place]) 12/13/2017  . Multiple lung nodules on CT   . CKD (chronic kidney disease), stage III ([place]) 04/15/2016  . Hx of adenomatous colonic polyps 04/12/2016  . Cough 02/09/2016  . Opacity of lung on imaging study 11/03/2015  . Severe persistent asthma 02/08/2015  . Facial pain syndrome 02/08/2015  . Insomnia 02/08/2015  . Other allergic rhinitis 02/08/2015  . LPRD (laryngopharyngeal reflux disease) 02/08/2015  . Chronic diastolic CHF (congestive heart failure) ([place]) 02/08/2015  . Nausea without vomiting 04/08/2014  . Benign paroxysmal positional vertigo 12/03/2012  . Dizziness and giddiness 10/29/2012  . Pseudogout 02/24/2012  . Pneumonia 05/31/2011  . Irritable bowel syndrome 01/02/2010  . Hyperlipidemia 12/23/2009  . Hyperthyroidism 11/12/2006  . Seasonal and perennial allergic rhinitis 11/12/2006  . GERD 11/12/2006  . NECK PAIN, CHRONIC 11/12/2006  . OSTEOPOROSIS 11/12/2006  . BREAST CANCER, HX OF 11/12/2006    Twylia Oka, PT 06/09/2018, 5:27 PM  [facility] [address] [place], [place], [address] Phone: [phone]   Fax:  [phone]  Name: Ebony Scott MRN: [id number] Date of Birth: 1950/11/19

## 2018-06-10 ENCOUNTER — Other Ambulatory Visit: Payer: Self-pay | Admitting: Family Medicine

## 2018-06-13 ENCOUNTER — Telehealth: Payer: Self-pay | Admitting: Allergy and Immunology

## 2018-06-13 NOTE — Telephone Encounter (Signed)
Patient wants to know what is going to be substituted for her RANITIDINE. It has been recalled

## 2018-06-13 NOTE — Telephone Encounter (Signed)
Famotidine 20mg  was sent in on 4/17 for her replacement. I called and informed patient. She voiced understanding.

## 2018-06-13 NOTE — Telephone Encounter (Signed)
Patient called back and stated the pharmacy informed her the Famotidine is on backorder.   I offered to send this prescription to another pharmacy and she did not want me to. I informed her this medication is OTC and she stated she did not want to purchase it OTC and wanted to know if you would like to send in a whole different medication?   Please advise.

## 2018-06-16 MED ORDER — FAMOTIDINE 20 MG PO TABS: 20.0000 mg | ORAL_TABLET | Freq: Two times a day (BID) | ORAL | 2 refills | Status: DC

## 2018-06-16 NOTE — Telephone Encounter (Signed)
Please provide patient for a prescription of famotidine 20 mg 2 tablets or famotidine 40 mg 1 tablet, what ever her pharmacy is able to acquire.

## 2018-06-16 NOTE — Telephone Encounter (Signed)
Sent in Famotidine 20mg  2 tablets daily. #60 1 rf

## 2018-06-17 ENCOUNTER — Ambulatory Visit: Payer: Medicare Other | Admitting: Rehabilitative and Restorative Service Providers"

## 2018-06-17 ENCOUNTER — Other Ambulatory Visit: Payer: Self-pay

## 2018-06-17 DIAGNOSIS — R2689 Other abnormalities of gait and mobility: Secondary | ICD-10-CM

## 2018-06-17 DIAGNOSIS — R2681 Unsteadiness on feet: Secondary | ICD-10-CM

## 2018-06-17 DIAGNOSIS — H8111 Benign paroxysmal vertigo, right ear: Secondary | ICD-10-CM

## 2018-06-17 DIAGNOSIS — R42 Dizziness and giddiness: Secondary | ICD-10-CM

## 2018-06-17 NOTE — Therapy (Signed)
[facility] [address] [place], [place], [address] Phone: [phone]   Fax:  [phone]  Physical Therapy Treatment  Patient Details  Name: Ebony Scott MRN: [id number] Date of Birth: 09-Jul-1950 Referring Provider (PT): Erling Cruz, MD   Encounter Date: 06/17/2018  PT End of Session - 06/17/18 1147    Visit Number  4    Number of Visits  4    Date for PT Re-Evaluation  07/05/18    Authorization Type  [employer] medicare    PT Start Time  1105    PT Stop Time  1150    PT Time Calculation (min)  45 min    Activity Tolerance  Patient tolerated treatment well    Behavior During Therapy  [facility] for tasks assessed/performed       Past Medical History:  Diagnosis Date  . Allergic rhinitis   . Allergy    SEASONAL  . Anemia   . Anxiety    pt denies  . Arthritis    "mostly the hands" (02/20/2012)  . Asthma   . Breast cancer ([place]) 1998   in remission  . Cataract    REMOVED  . Chronic lower back pain   . Chronic neck pain   . Complication of anesthesia    "had hard time waking up from it several times" (02/20/2012)  . Depression    "some; don't take anything for it" (02/20/2012)  . Diverticulosis   . DVT (deep venous thrombosis) ([place])   . Exertional dyspnea   . Fibromyalgia 11/2011  . GERD (gastroesophageal reflux disease)   . Graves disease   . Headache(784.0)    "related to allergies; more at different times during the year" (02/20/2012)  . Hemorrhoids   . Hiatal hernia    back and neck  . Hx of adenomatous colonic polyps 04/12/2016  . Hypercholesteremia    good cholesterol is high  . Hypothyroidism   . IBS (irritable bowel syndrome)   . Moderate persistent asthma    -FeV1 72% 2011, -IgE 102 2011, CT sinus Neg 2011  . Osteoporosis    on reclast yearly  . Pneumonia 04/2011; ~ 11/2011   "double; single" (02/20/2012)    Past Surgical History:  Procedure Laterality Date  . ANTERIOR AND POSTERIOR REPAIR  1990's  .  APPENDECTOMY    . BREAST LUMPECTOMY  1998   left  . CARPOMETACARPEL ([place]) FUSION OF THUMB WITH AUTOGRAFT FROM RADIUS  ~ 2009   "both thumbs" (02/20/2012)  . CATARACT EXTRACTION W/ INTRAOCULAR LENS  IMPLANT, BILATERAL  2012  . CERVICAL DISCECTOMY  10/2001   C5-C6  . CERVICAL FUSION  2003   C3-C4  . CHOLECYSTECTOMY    . COLONOSCOPY    . DEBRIDEMENT TENNIS ELBOW  ?1970's   right  . ESOPHAGOGASTRODUODENOSCOPY    . HYSTERECTOMY    . KNEE ARTHROPLASTY  ?1990's   "?right; w/cartilage repair" (02/20/2012)  . NASAL SEPTUM SURGERY  1980's  . POSTERIOR CERVICAL FUSION/FORAMINOTOMY  2004   "failed initial fusion; rewired  anterior neck" (02/20/2012)  . TONSILLECTOMY  ~ 1953  . VESICOVAGINAL FISTULA CLOSURE W/ TAH  1988  . VIDEO BRONCHOSCOPY Bilateral 08/23/2016   Procedure: VIDEO BRONCHOSCOPY WITH FLUORO;  Surgeon: Javier Glazier, MD;  Location: [facility];  Service: Cardiopulmonary;  Laterality: Bilateral;    There were no vitals filed for this visit.  Physical Therapy Telehealth Visit:  I connected with [name] today at 36 by [facility] video conference  and verified that I am speaking with the correct person using two identifiers.  I discussed the limitations, risks, security and privacy concerns of performing an evaluation and management service by Webex and the availability of in person appointments.  I also discussed with the patient that there may be a patient responsible charge related to this service. The patient expressed understanding and agreed to proceed.    The patient's address was confirmed.  Identified to the patient that therapist is a licensed PT in the state of [place].  Verified phone # as same as in epic to call in case of technical difficulties.     Subjective Assessment - 06/17/18 1114    Subjective  The patient reports that she is doing much better, but still feels vertigo about once per day when getting in and out of bed.      Pertinent History  h/o falls (fall in  11/2017 and on the floor 3 days, 2 other falls --one on steps and 1 in Jan), h/o c-diff, h/o DVT, graves disease, fibromyalgia,  hyperthyroidism, rhabdomyolysis, hyperlipidemia, CHF, CKD.    Patient being worked up for [place] rheumatica per her report.      Patient Stated Goals  Reduce vertigo and lightheadedness.    Currently in Pain?  No/denies             Vestibular Assessment - 06/17/18 1130      Vestibular Assessment   General Observation  Performed modified dix hallpike via telehealth.      Positional Testing   Dix-Hallpike  Dix-Hallpike Left;Dix-Hallpike Right      Dix-Hallpike Right   Dix-Hallpike Right Duration  10-15 seconds    Dix-Hallpike Right Symptoms  --   nystagmus not viewed due to telehealth assessment               Vestibular Treatment/Exercise - 06/17/18 1121      Vestibular Treatment/Exercise   Vestibular Treatment Provided  Habituation    Canalith Repositioning  Epley Manuever Right    Habituation Exercises  Horizontal Roll;Brandt Daroff   long sitting to modified dix hallpike      EPLEY MANUEVER RIGHT   Number of Reps   2    Overall Response  Improved Symptoms    Response Details   Notes a trace when rolling into third position during 2nd rep.      Nestor Lewandowsky   Number of Reps   3    Symptom Description   no dizziness with 3 reps of brandt daroff x 3 reps.      Horizontal Roll   Number of Reps   3    Symptom Description   no dizziness with horizontal rolling            PT Education - 06/17/18 2220    Education Details  updated HEP    Person(s) Educated  Patient    Methods  Explanation;Demonstration;Handout    Comprehension  Verbalized understanding;Returned demonstration          PT Long Term Goals - 06/05/18 1157      PT LONG TERM GOAL #1   Title  The patient will have negative positional testing.    Time  4    Period  Weeks    Target Date  07/05/18      PT LONG TERM GOAL #2   Title  The patient will  have HEP if needed for habituation and/or high level balance.    Time  4  Period  Weeks    Target Date  07/05/18            Plan - 06/17/18 1148    Clinical Impression Statement  The patient is noting significant improvement in vertigo.  She does still experience dizziness/vertigo intermittently when getting into bed/out of bed.  Sidelying and horizontal rolling do not provoke symtpoms today, but a modified dix hallpike position does.      PT Treatment/Interventions  ADLs/Self Care Home Management;Therapeutic activities;Therapeutic exercise;Canalith Repostioning;Vestibular;Neuromuscular re-education;Balance training;Gait training;Stair training;Functional mobility training;Patient/family education    PT Next Visit Plan  Check habituation and high level balance HEP.     Consulted and Agree with Plan of Care  Patient       Patient will benefit from skilled therapeutic intervention in order to improve the following deficits and impairments:     Visit Diagnosis: BPPV (benign paroxysmal positional vertigo), right  Unsteadiness on feet  Other abnormalities of gait and mobility  Dizziness and giddiness     Problem List Patient Active Problem List   Diagnosis Date Noted  . Clostridial gastroenteritis 03/06/2018  . Cellulitis of forearm   . Fever of unknown origin (FUO) 02/10/2018  . SIRS (systemic inflammatory response syndrome) ([place]) 02/10/2018  . Nausea and vomiting 02/10/2018  . Diarrhea 02/10/2018  . Abdominal pain 02/10/2018  . Fracture of base of fifth metatarsal bone with routine healing, left   . Closed fracture of fifth metatarsal bone of right foot, initial encounter 01/15/2018  . Clostridium difficile diarrhea 01/14/2018  . Clostridium difficile colitis 01/14/2018  . DVT (deep venous thrombosis) ([place]) 01/14/2018  . Hypothyroidism 01/14/2018  . Depression 01/14/2018  . Debility   . Acute left ankle pain   . Malnutrition of moderate degree 12/17/2017  . Knee  pain   . History of breast cancer   . Chronic pain syndrome   . Fibromyalgia   . Graves disease   . Leukocytosis   . Sepsis due to undetermined organism ([place]) 12/13/2017  . Rhabdomyolysis 12/13/2017  . Acute deep vein thrombosis (DVT) of right lower extremity ([place]) 12/13/2017  . Multiple lung nodules on CT   . CKD (chronic kidney disease), stage III ([place]) 04/15/2016  . Hx of adenomatous colonic polyps 04/12/2016  . Cough 02/09/2016  . Opacity of lung on imaging study 11/03/2015  . Severe persistent asthma 02/08/2015  . Facial pain syndrome 02/08/2015  . Insomnia 02/08/2015  . Other allergic rhinitis 02/08/2015  . LPRD (laryngopharyngeal reflux disease) 02/08/2015  . Chronic diastolic CHF (congestive heart failure) ([place]) 02/08/2015  . Nausea without vomiting 04/08/2014  . Benign paroxysmal positional vertigo 12/03/2012  . Dizziness and giddiness 10/29/2012  . Pseudogout 02/24/2012  . Pneumonia 05/31/2011  . Irritable bowel syndrome 01/02/2010  . Hyperlipidemia 12/23/2009  . Hyperthyroidism 11/12/2006  . Seasonal and perennial allergic rhinitis 11/12/2006  . GERD 11/12/2006  . NECK PAIN, CHRONIC 11/12/2006  . OSTEOPOROSIS 11/12/2006  . BREAST CANCER, HX OF 11/12/2006    Adra Shepler, PT 06/17/2018, 10:28 PM  [facility] [address] [place], [place], [address] Phone: [phone]   Fax:  [phone]  Name: Ebony Scott MRN: [id number] Date of Birth: [birth year]

## 2018-06-17 NOTE — Patient Instructions (Signed)
Access Code: [email or web address]  URL: [email or web address]  Date: 06/17/2018  Prepared by: Rudell Cobb   Program Notes  WAIT 2 DAYS TO BEGIN THESE EXERCISES AND ONLY DO IF DIZZINESS IS PRESENT. To be Safe: Go to the bathroom before you begin and have your phone nearby. Place your walker in front of you or beside the bed. After the exercises, sit and rest for 5-10 minutes to make sure dizziness and lightheadedness settle.   Exercises Brandt-Daroff Vestibular Exercise - 5 reps - 1 sets - 20 seconds hold - 2-3x daily - 7x weekly Supine to Long Sitting Vestibular Habituation - 5 reps - 1 sets - 20 seconds hold - 2x daily - 7x weekly Single Leg Stance - 3 reps - 1 sets - 1-2x daily - 7x weekly

## 2018-06-30 ENCOUNTER — Other Ambulatory Visit: Payer: Self-pay

## 2018-06-30 ENCOUNTER — Ambulatory Visit: Payer: Medicare Other | Attending: Family Medicine | Admitting: Rehabilitative and Restorative Service Providers"

## 2018-06-30 ENCOUNTER — Encounter: Payer: Self-pay | Admitting: Rehabilitative and Restorative Service Providers"

## 2018-06-30 VITALS — HR 84

## 2018-06-30 DIAGNOSIS — R2681 Unsteadiness on feet: Secondary | ICD-10-CM

## 2018-06-30 DIAGNOSIS — H8111 Benign paroxysmal vertigo, right ear: Secondary | ICD-10-CM | POA: Insufficient documentation

## 2018-06-30 DIAGNOSIS — R2689 Other abnormalities of gait and mobility: Secondary | ICD-10-CM | POA: Insufficient documentation

## 2018-06-30 DIAGNOSIS — R42 Dizziness and giddiness: Secondary | ICD-10-CM | POA: Insufficient documentation

## 2018-06-30 NOTE — Patient Instructions (Signed)
Access Code: [email or web address]  URL: [email or web address]  Date: 06/30/2018  Prepared by: Rudell Cobb   Program Notes  WAIT 2 DAYS TO BEGIN THESE EXERCISES AND ONLY DO IF DIZZINESS IS PRESENT. To be Safe: Go to the bathroom before you begin and have your phone nearby. Place your walker in front of you or beside the bed. After the exercises, sit and rest for 5-10 minutes to make sure dizziness and lightheadedness settle.  WALKING AND STAIRS: Go up and down the steps 2 times, 3 times per day. Walk for 6-8 minutes nonstop on level ground for endurance. Increase your walking as you can tolerate.   Exercises Brandt-Daroff Vestibular Exercise - 5 reps - 1 sets - 20 seconds hold - 2-3x daily - 7x weekly Supine to Long Sitting Vestibular Habituation - 5 reps - 1 sets - 20 seconds hold - 2x daily - 7x weekly Single Leg Stance - 3 reps - 1 sets - 1-2x daily - 7x weekly Seated Diaphragmatic Breathing - 10 reps - 3 sets - 1x daily - 7x weekly Standing Pursed Lip Breathing with Ribcage Elevation - 10 reps - 3 sets - 1x daily - 7x weekly

## 2018-06-30 NOTE — Therapy (Signed)
[facility] [address] [place], [place], [address] Phone: [phone]   Fax:  [phone]  Physical Therapy Treatment and Discharge Summary  Patient Details  Name: Ebony Scott MRN: [id number] Date of Birth: 1950-08-24 Referring Provider (PT): Erling Cruz, MD   Encounter Date: 06/30/2018  PT End of Session - 06/30/18 1108    Visit Number  5    Number of Visits  4    Date for PT Re-Evaluation  07/05/18    Authorization Type  [employer]HC medicare    PT Start Time  1104    PT Stop Time  1145    PT Time Calculation (min)  41 min    Activity Tolerance  Patient tolerated treatment well    Behavior During Therapy  [facility] for tasks assessed/performed        Pysical Therapy Telehealth Visit:  I connected with Sherral Hammers today at 1104am by Webex video conference and verified that I am speaking with the correct person using two identifiers.  I discussed the limitations, risks, security and privacy concerns of performing an evaluation and management service by Webex and the availability of in person appointments.  I also discussed with the patient that there may be a patient responsible charge related to this service. The patient expressed understanding and agreed to proceed.    The patient's address was confirmed.  Identified to the patient that therapist is a licensed [place] in the state of [place].  Verified phone # as listed in [place]. to call in case of technical difficulties.    Past Medical History:  Diagnosis Date  . Allergic rhinitis   . Allergy    SEASONAL  . Anemia   . Anxiety    pt denies  . Arthritis    "mostly the hands" (02/20/2012)  . Asthma   . Breast cancer ([place]) 1998   in remission  . Cataract    REMOVED  . Chronic lower back pain   . Chronic neck pain   . Complication of anesthesia    "had hard time waking up from it several times" (02/20/2012)  . Depression    "some; don't take anything for it" (02/20/2012)  .  Diverticulosis   . DVT (deep venous thrombosis) ([place])   . Exertional dyspnea   . Fibromyalgia 11/2011  . GERD (gastroesophageal reflux disease)   . Graves disease   . Headache(784.0)    "related to allergies; more at different times during the year" (02/20/2012)  . Hemorrhoids   . Hiatal hernia    back and neck  . Hx of adenomatous colonic polyps 04/12/2016  . Hypercholesteremia    good cholesterol is high  . Hypothyroidism   . IBS (irritable bowel syndrome)   . Moderate persistent asthma    -FeV1 72% 2011, -IgE 102 2011, CT sinus Neg 2011  . Osteoporosis    on reclast yearly  . Pneumonia 04/2011; ~ 11/2011   "double; single" (02/20/2012)    Past Surgical History:  Procedure Laterality Date  . ANTERIOR AND POSTERIOR REPAIR  1990's  . APPENDECTOMY    . BREAST LUMPECTOMY  1998   left  . CARPOMETACARPEL ([place]) FUSION OF THUMB WITH AUTOGRAFT FROM RADIUS  ~ 2009   "both thumbs" (02/20/2012)  . CATARACT EXTRACTION W/ INTRAOCULAR LENS  IMPLANT, BILATERAL  2012  . CERVICAL DISCECTOMY  10/2001   C5-C6  . CERVICAL FUSION  2003   C3-C4  . CHOLECYSTECTOMY    . COLONOSCOPY    .  DEBRIDEMENT TENNIS ELBOW  ?1970's   right  . ESOPHAGOGASTRODUODENOSCOPY    . HYSTERECTOMY    . KNEE ARTHROPLASTY  ?1990's   "?right; w/cartilage repair" (02/20/2012)  . NASAL SEPTUM SURGERY  1980's  . POSTERIOR CERVICAL FUSION/FORAMINOTOMY  2004   "failed initial fusion; rewired  anterior neck" (02/20/2012)  . TONSILLECTOMY  ~ 1953  . VESICOVAGINAL FISTULA CLOSURE W/ TAH  1988  . VIDEO BRONCHOSCOPY Bilateral 08/23/2016   Procedure: VIDEO BRONCHOSCOPY WITH FLUORO;  Surgeon: Javier Glazier, MD;  Location: [facility];  Service: Cardiopulmonary;  Laterality: Bilateral;    Vitals:   06/30/18 1114  Pulse: 84  SpO2: 95%    Subjective Assessment - 06/30/18 1106    Subjective  The patient reports the exercise puts her into lightheadedness that lasts for 4-5 days.  I'm not "dizzy", but the lightheadedness hangs  around.  She has to be more careful with standing up and moving around.  Once that senation passed, she has not had any dizziiness when she gets in bed.    Pertinent History  h/o falls (fall in 11/2017 and on the floor 3 days, 2 other falls --one on steps and 1 in Jan), h/o c-diff, h/o DVT, graves disease, fibromyalgia,  hyperthyroidism, rhabdomyolysis, hyperlipidemia, CHF, CKD.    Patient being worked up for [place] rheumatica per her report.      Patient Stated Goals  Reduce vertigo and lightheadedness.                       [facility] Adult PT Treatment/Exercise - 06/30/18 1110      Ambulation/Gait   Ambulation/Gait  Yes   gait and stairs used as therapeutic exercise today   Ambulation/Gait Assistance  6: Modified independent (Device/Increase time)    Assistive device  None    Ambulation Surface  Level;Indoor    Stairs  Yes    Stairs Assistance  6: Modified independent (Device/Increase time)    Stair Management Technique  One rail Right;Alternating pattern    Number of Stairs  28    Gait Comments  SPO2%=92%, HR=80 bpm. Borg analog scale is "slight" after climbing flight of steps x 2 reps.      Self-Care   Self-Care  Other Self-Care Comments    Other Self-Care Comments   Home walking program discussed.  She was setting Alexa to 10 minute timer and was able to do this last week.  She has been upset due to plumbing problem at home and is not sleeping as well.  She notes this has made her more fatigued.  The patient has a pulse oximeter at home.  We discussed monitoring her vitals and beginning at 6-8 minutes of nonstop walking + climbing steps 2 reps x 3 times t/o the day.      Neuro Re-ed    Neuro Re-ed Details   Reviewed single leg stance exercises for high level balance.      Exercises   Exercises  Other Exercises    Other Exercises   Walked for endurance training to establish a baseline for indoor walking.  She is walking indoors on level ground.  At 5 minutes she rates  "moderate" effort on modified Borg scale.    Patient rates symptoms continue at moderate to slightly severe in shortness of breath.  Vitals are HR=85 bpm SpO2%= 95% at end of 6 minutes of walking.  PT Long Term Goals - 06/30/18 1109      PT LONG TERM GOAL #1   Title  The patient will have negative positional testing.    Baseline  The patient reports she is not having any problem whatsoever with dizziness.  She feel the dizziness/vertigo is resolved.    Time  4    Period  Weeks    Status  Achieved      PT LONG TERM GOAL #2   Title  The patient will have HEP if needed for habituation and/or high level balance.    Baseline  Patient has habituation for self mgmt of vertigo, however has no current symptoms.  She has high level balance exercises and endurance/stair activities to continue to work on at this time.    Time  4    Period  Weeks    Status  Achieved            Plan - 06/30/18 1202    Clinical Impression Statement  The patient has met LTGs pertaining to vertigo and dizziness.  She is walking without the RW in the home and to her mailbox.  She notes no vertigo when getting into/out of bed.  She does continue with limitations in mobility she feels are more related to endurance.  PT to recommend pulmonary rehab for post d/c plan (patient has follow-up with NP at pulmonary clinic next month).  Provided endurance training with self monitoring at this time.    PT Frequency  1x / week    PT Treatment/Interventions  ADLs/Self Care Home Management;Therapeutic activities;Therapeutic exercise;Canalith Repostioning;Vestibular;Neuromuscular re-education;Balance training;Gait training;Stair training;Functional mobility training;Patient/family education    PT Next Visit Plan  discharge today    Consulted and Agree with Plan of Care  Patient       Patient will benefit from skilled therapeutic intervention in order to improve the following deficits and impairments:   Dizziness, Decreased balance  Visit Diagnosis: BPPV (benign paroxysmal positional vertigo), right  Unsteadiness on feet  Other abnormalities of gait and mobility  Dizziness and giddiness    PHYSICAL THERAPY DISCHARGE SUMMARY  Visits from Start of Care: 5  Current functional level related to goals / functional outcomes: See goals above.   Remaining deficits: Limitations in mobility secondary to endurance   Education / Equipment: Home program, self mgmt of BPPV, high level balance, endurance training.  Plan: Patient agrees to discharge.  Patient goals were met. Patient is being discharged due to meeting the stated rehab goals.  ?????    Thank you for the referral of this patient. Rudell Cobb, MPT           [place], PT 06/30/2018, 12:04 PM  [facility] [address] [place], [place], [address] Phone: [phone]   Fax:  [phone]  Name: TAFFY DELCONTE MRN: [id number] Date of Birth: December 12, 1950

## 2018-07-04 ENCOUNTER — Other Ambulatory Visit: Payer: Medicare Other

## 2018-07-04 ENCOUNTER — Encounter: Payer: Self-pay | Admitting: Family Medicine

## 2018-07-04 ENCOUNTER — Ambulatory Visit: Payer: Medicare Other | Admitting: Family Medicine

## 2018-07-04 ENCOUNTER — Other Ambulatory Visit: Payer: Self-pay

## 2018-07-04 VITALS — BP 130/74 | HR 80 | Temp 98.8°F | Resp 20 | Ht 62.0 in | Wt 140.0 lb

## 2018-07-04 DIAGNOSIS — E7800 Pure hypercholesterolemia, unspecified: Secondary | ICD-10-CM

## 2018-07-04 MED ORDER — AZITHROMYCIN 250 MG PO TABS: ORAL_TABLET | ORAL | 0 refills | Status: DC

## 2018-07-04 MED ORDER — FLUCONAZOLE 150 MG PO TABS: 150.0000 mg | ORAL_TABLET | Freq: Once | ORAL | 0 refills | Status: AC

## 2018-07-04 NOTE — Progress Notes (Signed)
Subjective:    Patient ID: Ebony Scott, female    DOB: [birth year], 68 y.o.   MRN: [id number]  HPI 03/18/18 Patient is here today for hospital discharge follow-up.  I have reviewed the discharge summary from the hospital.  Patient's lab work was significant for an elevated CRP as well as an elevated sed rate greater than 140.  Patient was lethargic with fevers.  As soon as she started prednisone, her fevers resolved and she hard to feel better.  She is now down to 30 mg a day of prednisone.  She is starting to have some mild pain in the joints of her wrist but otherwise is doing much better than the way she appeared at the last visit.  The question is why she having fevers of unknown origin.  Why is her sed rate and her CRP elevated.  She is seeing rheumatology who is done a battery of blood tests which are still pending.  My suspicion is some type of atypical polymyalgia rheumatica versus an underlying bone marrow abnormality.  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  She also reports a cough today.  There are bibasilar crackles on exam however this was present at the last visit I suspect may be atelectasis.  At that time, my plan was: Patient has fever of unknown origin.  Leading suspicion is an autoimmune disease however work-up thus far has been negative.  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  She is under the care of a rheumatologist and further blood work is pending.  At the present time we will make no adjustments in her prednisone and I will defer this to her rheumatologist.  However I still question why the patient developed a DVT initially last fall prompting all the complications that arose thereafter.  This was unprovoked.  This would raise the concern for hypercoagulable state.  She is also been having fever of unknown origin.  There is also been a substantial  drop in her hemoglobin from 15 down to 7.9 since the DVT.  Now during that time she has been started on anticoagulation however her initial stool study was negative for blood.  Work-up has showed a low iron however she has been on iron replacement now for several weeks and her hemoglobin has not improved.  In fact it is dropped further.  Therefore I am going to repeat a stool study today.  If there is blood in the stool, she will need a GI evaluation for blood loss causing her anemia.  If the stool study is negative, I will repeat an iron level.  If the iron level is now normal, I would recommend an oncology hematology consultation for anemia and to evaluate for possible underlying bone marrow abnormalities that may explain the patient's fever of unknown origin, diffuse joint and bone pain, hypercoagulable state, etc.  Patient states that the rheumatologist is going to schedule her for a PET scan as well if the initial lab work is normal.  He however he is waiting for the lab work to return prior to scheduling the PET scan.  Therefore I will be interested to see the results of the PET scan as well.  She does have bibasilar crackles today on her exam however I will obtain a chest x-ray as I believe this may be atelectasis.  If there is evidence of infiltrate, I  will treat the patient for possible hospital-acquired pneumonia  05/23/18 Most recent Hgb had increased from 9.7 to 10.9 in Feb. patient is due to recheck her CBC today.  She is still taking iron replacement.  She is now completed 6 months of anticoagulation for her DVT and PE.  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  This 1 occurred unprovoked.  Therefore again we had a discussion today about discontinuing her anticoagulant versus continuing lifelong anticoagulation.  Given the risk of developing a third DVT/PE in her lifetime, the patient elects to stay on lifetime anticoagulation at this point.  She denies any rectal bleeding.  She denies any melena.   She denies any hemoptysis.  She is seeing rheumatology who is diagnosed her with an inflammatory rheumatoid arthritis despite normal lab studies.  However the patient had a profoundly elevated sedimentation rate and this is been the consensus diagnosis.  She has been gradually weaned down on prednisone and they have started methotrexate 2 weeks ago.  At the end of last year, the patient has significant elevations in her LFTs which may have been due to autoimmune inflammation possibly due to her underlying inflammatory syndrome.  However at that time we discontinued her statin.  We are due today to recheck a fasting lipid panel as she has been off her statin now for almost 3 months.  However we will need to monitor her liver function test closely prior to resuming any statin particular now that the patient is on methotrexate.  At that time, my plan was: Patient had elevated liver function test that were never truly determine what the source is.  She is off her statin.  I suspect that there may have been some autoimmune hepatitis possibly causing the elevated LFTs.  Therefore I will repeat a CMP today.  If her liver function test are stable or better on the methotrexate, I would consider resuming her statin particular if her LDL cholesterol is elevated.  I will monitor her anemia with a CBC and if her hemoglobin has normalized, we will discontinue iron.  I will also check her cholesterol as stated above.  Her thyroid was just checked in February and was normal.  I would repeat that in August.  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  Therefore we will monitor her hemoglobin closely as she will continue her anticoagulant indefinitely.  As stated in the history and physical, her wart on her right second toe was treated with liquid nitrogen cryotherapy.  07/04/18 After the patient's last visit, she was started on a statin for  her elevated cholesterol.  I had her return today so that we can recheck her cholesterol along with her liver function test given the fact she is also on methotrexate to monitor for any evidence of hepatitis.  She is tolerating the statin well without any myalgias.  However she reports a 3-week history of cough that is gradually getting worse and is now productive of yellow and green sputum.  She also reports chest congestion that has not improved since starting Mucinex and Zyrtec.  She denies any fevers.  She denies any body aches.  She has been quarantining at home and has no sick contacts that she is aware of.  Past Medical History:  Diagnosis Date  . Allergic rhinitis   . Allergy    SEASONAL  . Anemia   . Anxiety    pt  denies  . Arthritis    "mostly the hands" (02/20/2012)  . Asthma   . Breast cancer ([place]) 1998   in remission  . Cataract    REMOVED  . Chronic lower back pain   . Chronic neck pain   . Complication of anesthesia    "had hard time waking up from it several times" (02/20/2012)  . Depression    "some; don't take anything for it" (02/20/2012)  . Diverticulosis   . DVT (deep venous thrombosis) ([place])   . Exertional dyspnea   . Fibromyalgia 11/2011  . GERD (gastroesophageal reflux disease)   . Graves disease   . Headache(784.0)    "related to allergies; more at different times during the year" (02/20/2012)  . Hemorrhoids   . Hiatal hernia    back and neck  . Hx of adenomatous colonic polyps 04/12/2016  . Hypercholesteremia    good cholesterol is high  . Hypothyroidism   . IBS (irritable bowel syndrome)   . Moderate persistent asthma    -FeV1 72% 2011, -IgE 102 2011, CT sinus Neg 2011  . Osteoporosis    on reclast yearly  . Pneumonia 04/2011; ~ 11/2011   "double; single" (02/20/2012)   Past Surgical History:  Procedure Laterality Date  . ANTERIOR AND POSTERIOR REPAIR  1990's  . APPENDECTOMY    . BREAST LUMPECTOMY  1998   left  . CARPOMETACARPEL ([place]) FUSION OF THUMB  WITH AUTOGRAFT FROM RADIUS  ~ 2009   "both thumbs" (02/20/2012)  . CATARACT EXTRACTION W/ INTRAOCULAR LENS  IMPLANT, BILATERAL  2012  . CERVICAL DISCECTOMY  10/2001   C5-C6  . CERVICAL FUSION  2003   C3-C4  . CHOLECYSTECTOMY    . COLONOSCOPY    . DEBRIDEMENT TENNIS ELBOW  ?1970's   right  . ESOPHAGOGASTRODUODENOSCOPY    . HYSTERECTOMY    . KNEE ARTHROPLASTY  ?1990's   "?right; w/cartilage repair" (02/20/2012)  . NASAL SEPTUM SURGERY  1980's  . POSTERIOR CERVICAL FUSION/FORAMINOTOMY  2004   "failed initial fusion; rewired  anterior neck" (02/20/2012)  . TONSILLECTOMY  ~ 1953  . VESICOVAGINAL FISTULA CLOSURE W/ TAH  1988  . VIDEO BRONCHOSCOPY Bilateral 08/23/2016   Procedure: VIDEO BRONCHOSCOPY WITH FLUORO;  Surgeon: Javier Glazier, MD;  Location: [facility];  Service: Cardiopulmonary;  Laterality: Bilateral;   Current Outpatient Medications on File Prior to Visit  Medication Sig Dispense Refill  . acetaminophen (TYLENOL) 650 MG CR tablet Take 1,300 mg by mouth every 8 (eight) hours as needed for pain.    Marland Kitchen albuterol (PROAIR HFA) 108 (90 Base) MCG/ACT inhaler Inhale 2 puffs into the lungs every 4 (four) hours as needed for wheezing or shortness of breath. 18 g 5  . Alpha-D-Galactosidase (BEANO PO) Take 1 capsule by mouth 3 (three) times daily as needed (gas/ bloating).     . Alpha-Lipoic Acid 600 MG CAPS Take 600 mg by mouth daily.     Marland Kitchen alum & mag hydroxide-simeth (MAALOX/MYLANTA) 200-200-20 MG/5ML suspension Take 15 mLs by mouth every 4 (four) hours as needed for indigestion or heartburn. 355 mL 0  . atorvastatin (LIPITOR) 10 MG tablet Take 1 tablet (10 mg total) by mouth daily. 90 tablet 3  . azelastine (ASTELIN) 0.1 % nasal spray USE 2 SPRAYS IN EACH NOSTRIL DAILY AS DIRECTED 30 mL 5  . CALCIUM-MAGNESIUM PO Take 1 tablet by mouth at bedtime.    . cetirizine (ZYRTEC) 10 MG tablet Take 5 mg by mouth every evening.     Marland Kitchen  cholecalciferol (VITAMIN D) 25 MCG (1000 UT) tablet Take 1,000  Units by mouth daily.     Marland Kitchen denosumab (PROLIA) 60 MG/ML SOLN injection Inject 60 mg into the skin every 6 (six) months. Administer in upper arm, thigh, or abdomen    . dexlansoprazole (DEXILANT) 60 MG capsule Take 1 capsule (60 mg total) by mouth every morning. 30 capsule 5  . diclofenac sodium (VOLTAREN) 1 % GEL Apply 2 g topically 2 (two) times daily. 1 Tube 5  . EPINEPHrine (EPIPEN 2-PAK) 0.3 mg/0.3 mL IJ SOAJ injection Inject 0.3 mLs (0.3 mg total) into the muscle Once PRN for up to 1 dose. 1 Device 1  . famotidine (PEPCID) 20 MG tablet Take 1 tablet (20 mg total) by mouth 2 (two) times daily. 60 tablet 2  . folic acid (FOLVITE) 1 MG tablet Take 2 mg by mouth daily.     Marland Kitchen gabapentin (NEURONTIN) 600 MG tablet TAKE 1 TABLET BY MOUTH DAILY 90 tablet 3  . Hydrocortisone (GERHARDT'S BUTT CREAM) CREA Apply 1 application topically 4 (four) times daily. 60 each 1  . hydrocortisone (PROCTOCORT) 1 % CREA Gerhardts butt cream - Use as directed 60 g 1  . Lactase (LACTAID FAST ACT) 9000 units TABS Take 18,000 Units by mouth 4 (four) times daily as needed (dairy consumption).     Marland Kitchen lidocaine (LIDODERM) 5 % Place 1 patch onto the skin daily as needed (pain). Remove & Discard patch within 12 hours or as directed by MD    . methotrexate 2.5 MG tablet Take 15 mg by mouth once a week.     . mirtazapine (REMERON SOL-TAB) 30 MG disintegrating tablet Take 30 mg by mouth at bedtime.     . montelukast (SINGULAIR) 10 MG tablet TAKE 1 TABLET BY MOUTH DAILY 30 tablet 4  . mupirocin ointment (BACTROBAN) 2 % Place 1 application into the nose at bedtime. To prevent nose bleeds    . nystatin (MYCOSTATIN) 100000 UNIT/ML suspension USE AS DIRECTED 5MLS IN MOUTH OR THROAT 4 TIMES DAILY 473 mL 1  . predniSONE (DELTASONE) 5 MG tablet 15 mg.     . PRESCRIPTION MEDICATION Place 2 puffs into both ears once a week. Place capsule in dispenser and instill 2 puffs in each ear once a week after washing hair. CS (X) AHCL     CHLORAMP/SULFU/AMPHO B/ H DROC 100-100-5 mg capsule - compounded at [facility]    . Propylene Glycol-Glycerin (SOOTHE OP) Place 1 drop into both eyes 2 (two) times daily as needed (dry eyes).     Marland Kitchen Respiratory Therapy Supplies (FLUTTER) DEVI Use as directed 1 each 0  . rivaroxaban (XARELTO) 20 MG TABS tablet Take 1 tablet (20 mg total) by mouth daily with supper. 90 tablet 3  . saccharomyces boulardii (FLORASTOR) 250 MG capsule Take 1 capsule (250 mg total) by mouth 2 (two) times daily. 60 capsule 2  . SPIRIVA RESPIMAT 2.5 MCG/ACT AERS INHALE 2 PUFFS INTO THE LUNGS DAILY 4 g 4   No current facility-administered medications on file prior to visit.    Allergies  Allergen Reactions  . Dust Mite Extract Shortness Of Breath and Other (See Comments)    "sneezing" (02/20/2012)  . Molds & Smuts Shortness Of Breath  . Morphine Sulfate Itching  . Other Shortness Of Breath and Other (See Comments)    Grass and weeds "sneezing; filled sinuses" (02/20/2012)  . Penicillins Rash and Other (See Comments)    "welts" (02/20/2012) Has patient had a PCN  reaction causing immediate rash, facial/tongue/throat swelling, SOB or lightheadedness with hypotension: Unknown Has patient had a PCN reaction causing severe rash involving mucus membranes or skin necrosis: No Has patient had a PCN reaction that required hospitalization: No Has patient had a PCN reaction occurring within the last 10 years: No If all of the above answers are "NO", then may proceed with Cephalosporin use.   Marland Kitchen Reclast [Zoledronic Acid] Other (See Comments)    Fever, Put in hospital, dr said it was a reaction from a reaction   . Rofecoxib Swelling    Vioxx REACTION: feet swelling  . Shrimp Flavor Anaphylaxis    ALL SHELLFISH  . Tetracycline Hcl Nausea And Vomiting  . Xolair [Omalizumab] Other (See Comments)    Caused Blood clot  . Dilaudid [Hydromorphone Hcl] Itching  . Hydrocodone-Acetaminophen Nausea And Vomiting  . Levofloxacin Other  (See Comments)    REACTION: GI upset  . Oxycodone Hcl Nausea And Vomiting  . Paroxetine Nausea And Vomiting    Paxil   . Celecoxib Swelling    Feet swelling  . Diltiazem Swelling  . Lactose Intolerance (Gi) Other (See Comments)    Bloating and gas  . Tree Extract Other (See Comments)    "tested and told I was allergic to it; never experienced a reaction to it" (02/20/2012)   Social History   Socioeconomic History  . Marital status: Divorced    Spouse name: Not on file  . Number of children: 2  . Years of education: college  . Highest education level: Not on file  Occupational History  . Occupation: Disabled    Comment: Retired Engineer, production: RETIRED  Social Needs  . Financial resource strain: Not on file  . Food insecurity:    Worry: Not on file    Inability: Not on file  . Transportation needs:    Medical: Not on file    Non-medical: Not on file  Tobacco Use  . Smoking status: Passive Smoke Exposure - Never Smoker  . Smokeless tobacco: Never Used  . Tobacco comment: Parents  Substance and Sexual Activity  . Alcohol use: Not Currently    Alcohol/week: 0.0 standard drinks  . Drug use: No  . Sexual activity: Never  Lifestyle  . Physical activity:    Days per week: Not on file    Minutes per session: Not on file  . Stress: Not on file  Relationships  . Social connections:    Talks on phone: Not on file    Gets together: Not on file    Attends religious service: Not on file    Active member of club or organization: Not on file    Attends meetings of clubs or organizations: Not on file    Relationship status: Not on file  . Intimate partner violence:    Fear of current or ex partner: Not on file    Emotionally abused: Not on file    Physically abused: Not on file    Forced sexual activity: Not on file  Other Topics Concern  . Not on file  Social History Narrative   Patient lives at home alone. Patient  divorced.    Patient has her  BS degree.   Right handed.   Caffeine- sometimes coffee.      [facility]:   Born in [place], [place]. She worked as a Copywriter, advertising. She has no pets currently. She does have indoor plants. Previously had mold in her home  that was remediated. Carpet was removed.            Review of Systems  All other systems reviewed and are negative.      Objective:   Physical Exam Vitals signs reviewed.  Constitutional:      General: She is not in acute distress.    Appearance: Normal appearance. She is well-developed. She is not ill-appearing or toxic-appearing.  HENT:     Right Ear: Tympanic membrane and ear canal normal.     Left Ear: Tympanic membrane and ear canal normal.     Nose: No congestion or rhinorrhea.  Cardiovascular:     Rate and Rhythm: Normal rate and regular rhythm.     Heart sounds: Normal heart sounds.  Pulmonary:     Effort: Pulmonary effort is normal. No respiratory distress.     Breath sounds: No stridor or decreased air movement. No wheezing or rales.  Abdominal:     General: Bowel sounds are normal. There is no distension.     Palpations: Abdomen is soft.     Tenderness: There is no abdominal tenderness. There is no guarding.  Skin:    Findings: No erythema.  Neurological:     Mental Status: She is alert and oriented to person, place, and time.     Cranial Nerves: No cranial nerve deficit.     Sensory: No sensory deficit.     Motor: No weakness.     Coordination: Coordination normal.          Assessment & Plan:   Pure hypercholesterolemia - Plan: COMPLETE METABOLIC PANEL WITH GFR, Lipid panel  Check a CMP and a fasting lipid panel to monitor for any evidence of toxicity due to her statin.  Ideally I like her LDL cholesterol to be below 100.  I am concerned the patient may have bronchitis given her compromised immune system on prednisone and methotrexate I will start the patient on a Z-Pak however it is most likely that this is viral or allergy  related.  Therefore I recommended that she continue Mucinex and Zyrtec.  Recheck immediately if she develops fevers chills or worsening shortness of breath

## 2018-07-05 LAB — COMPLETE METABOLIC PANEL WITH GFR
AG Ratio: 1.6 (calc) (ref 1.0–2.5)
ALT: 24 U/L (ref 6–29)
AST: 22 U/L (ref 10–35)
Albumin: 3.7 g/dL (ref 3.6–5.1)
Alkaline phosphatase (APISO): 32 U/L — ABNORMAL LOW (ref 37–153)
BUN/Creatinine Ratio: 24 (calc) — ABNORMAL HIGH (ref 6–22)
BUN: 32 mg/dL — ABNORMAL HIGH (ref 7–25)
CO2: 25 mmol/L (ref 20–32)
Calcium: 9.8 mg/dL (ref 8.6–10.4)
Chloride: 106 mmol/L (ref 98–110)
Creat: 1.34 mg/dL — ABNORMAL HIGH (ref 0.60–0.93)
GFR, Est African American: 46 mL/min/{1.73_m2} — ABNORMAL LOW (ref >=60)
GFR, Est Non African American: 40 mL/min/{1.73_m2} — ABNORMAL LOW (ref >=60)
Globulin: 2.3 g/dL (calc) (ref 1.9–3.7)
Glucose, Bld: 77 mg/dL (ref 65–99)
Potassium: 4.4 mmol/L (ref 3.5–5.3)
Sodium: 143 mmol/L (ref 135–146)
Total Bilirubin: 0.5 mg/dL (ref 0.2–1.2)
Total Protein: 6 g/dL — ABNORMAL LOW (ref 6.1–8.1)

## 2018-07-05 LAB — LIPID PANEL
Cholesterol: 192 mg/dL (ref <200)
HDL: 84 mg/dL (ref >=50)
LDL Cholesterol (Calc): 77 mg/dL (calc)
Non-HDL Cholesterol (Calc): 108 mg/dL (calc) (ref <130)
Total CHOL/HDL Ratio: 2.3 (calc) (ref <5.0)
Triglycerides: 212 mg/dL — ABNORMAL HIGH (ref <150)

## 2018-07-05 LAB — EXTRA LAV TOP TUBE

## 2018-07-07 ENCOUNTER — Encounter: Payer: Self-pay | Admitting: Family Medicine

## 2018-07-10 ENCOUNTER — Other Ambulatory Visit: Payer: Self-pay | Admitting: Family Medicine

## 2018-07-10 MED ORDER — GERHARDT'S BUTT CREAM: 1.0000 "application " | TOPICAL_CREAM | Freq: Four times a day (QID) | CUTANEOUS | 1 refills | Status: DC

## 2018-07-22 ENCOUNTER — Telehealth: Payer: Self-pay | Admitting: Family Medicine

## 2018-07-22 MED ORDER — HYDROCORTISONE 0.5 % EX CREA: 1.0000 "application " | TOPICAL_CREAM | Freq: Four times a day (QID) | CUTANEOUS | 5 refills | Status: DC

## 2018-07-22 MED FILL — GERHARDT'S BUTT CREAM BULK: 20 days supply | Qty: 60 | Fill #0

## 2018-07-22 NOTE — Telephone Encounter (Signed)
[facility]

## 2018-07-22 NOTE — Telephone Encounter (Signed)
Sent over for Hydrocortisone cream with added not about filling Gerhardts butt cream as Epic will not let me send that electronically.

## 2018-07-23 ENCOUNTER — Encounter: Payer: Self-pay | Admitting: Family Medicine

## 2018-07-29 ENCOUNTER — Other Ambulatory Visit: Payer: Medicare Other

## 2018-07-29 ENCOUNTER — Encounter: Payer: Self-pay | Admitting: Adult Health

## 2018-07-29 ENCOUNTER — Telehealth: Payer: Self-pay

## 2018-07-29 ENCOUNTER — Other Ambulatory Visit: Payer: Self-pay

## 2018-07-29 ENCOUNTER — Telehealth: Payer: Self-pay | Admitting: Adult Health

## 2018-07-29 ENCOUNTER — Ambulatory Visit (INDEPENDENT_AMBULATORY_CARE_PROVIDER_SITE_OTHER): Payer: Medicare Other

## 2018-07-29 ENCOUNTER — Ambulatory Visit: Payer: Medicare Other | Admitting: Adult Health

## 2018-07-29 VITALS — BP 140/78 | HR 83 | Temp 98.1°F | Ht 61.0 in | Wt 145.2 lb

## 2018-07-29 DIAGNOSIS — Z20822 Contact with and (suspected) exposure to covid-19: Secondary | ICD-10-CM

## 2018-07-29 DIAGNOSIS — I2699 Other pulmonary embolism without acute cor pulmonale: Secondary | ICD-10-CM | POA: Insufficient documentation

## 2018-07-29 DIAGNOSIS — J45901 Unspecified asthma with (acute) exacerbation: Secondary | ICD-10-CM | POA: Diagnosis not present

## 2018-07-29 DIAGNOSIS — R0609 Other forms of dyspnea: Secondary | ICD-10-CM

## 2018-07-29 DIAGNOSIS — J3089 Other allergic rhinitis: Secondary | ICD-10-CM | POA: Diagnosis not present

## 2018-07-29 DIAGNOSIS — J4541 Moderate persistent asthma with (acute) exacerbation: Secondary | ICD-10-CM | POA: Diagnosis not present

## 2018-07-29 DIAGNOSIS — R0600 Dyspnea, unspecified: Secondary | ICD-10-CM

## 2018-07-29 DIAGNOSIS — I5032 Chronic diastolic (congestive) heart failure: Secondary | ICD-10-CM

## 2018-07-29 DIAGNOSIS — J302 Other seasonal allergic rhinitis: Secondary | ICD-10-CM

## 2018-07-29 HISTORY — DX: Other pulmonary embolism without acute cor pulmonale: I26.99

## 2018-07-29 LAB — CBC WITH DIFFERENTIAL/PLATELET
Basophils Absolute: 0 10*3/uL (ref 0.0–0.1)
Basophils Relative: 0.3 % (ref 0.0–3.0)
Eosinophils Absolute: 0.1 10*3/uL (ref 0.0–0.7)
Eosinophils Relative: 0.6 % (ref 0.0–5.0)
HCT: 37.3 % (ref 36.0–46.0)
Hemoglobin: 12.1 g/dL (ref 12.0–15.0)
Lymphocytes Relative: 8.3 % — ABNORMAL LOW (ref 12.0–46.0)
Lymphs Abs: 1.1 10*3/uL (ref 0.7–4.0)
MCHC: 32.5 g/dL (ref 30.0–36.0)
MCV: 102.4 fl — ABNORMAL HIGH (ref 78.0–100.0)
Monocytes Absolute: 0.8 10*3/uL (ref 0.1–1.0)
Monocytes Relative: 5.8 % (ref 3.0–12.0)
Neutro Abs: 11.1 10*3/uL — ABNORMAL HIGH (ref 1.4–7.7)
Neutrophils Relative %: 85 % — ABNORMAL HIGH (ref 43.0–77.0)
Platelets: 248 10*3/uL (ref 150.0–400.0)
RBC: 3.64 Mil/uL — ABNORMAL LOW (ref 3.87–5.11)
RDW: 16.5 % — ABNORMAL HIGH (ref 11.5–15.5)
WBC: 13.1 10*3/uL — ABNORMAL HIGH (ref 4.0–10.5)

## 2018-07-29 LAB — BASIC METABOLIC PANEL
BUN: 28 mg/dL — ABNORMAL HIGH (ref 6–23)
CO2: 26 mEq/L (ref 19–32)
Calcium: 9.3 mg/dL (ref 8.4–10.5)
Chloride: 107 mEq/L (ref 96–112)
Creatinine, Ser: 1.21 mg/dL — ABNORMAL HIGH (ref 0.40–1.20)
GFR: 43.94 mL/min — ABNORMAL LOW (ref >60.00)
Glucose, Bld: 96 mg/dL (ref 70–99)
Potassium: 4.2 mEq/L (ref 3.5–5.1)
Sodium: 141 mEq/L (ref 135–145)

## 2018-07-29 LAB — BRAIN NATRIURETIC PEPTIDE: Pro B Natriuretic peptide (BNP): 53 pg/mL (ref 0.0–100.0)

## 2018-07-29 LAB — SEDIMENTATION RATE: Sed Rate: 41 mm/hr — ABNORMAL HIGH (ref 0–30)

## 2018-07-29 MED ORDER — MOMETASONE FURO-FORMOTEROL FUM 200-5 MCG/ACT IN AERO: 2.0000 | INHALATION_SPRAY | Freq: Two times a day (BID) | RESPIRATORY_TRACT | 3 refills | Status: DC

## 2018-07-29 MED ORDER — AEROCHAMBER PLUS MISC: 0 refills | Status: DC

## 2018-07-29 NOTE — Telephone Encounter (Signed)
Left VM to return call to schedule covid testing.

## 2018-07-29 NOTE — Telephone Encounter (Signed)
Patient needs Covid testing

## 2018-07-29 NOTE — Patient Instructions (Addendum)
Chest xray and labs today  Restart Dulera 100 2 puffs Twice daily  , brush , rinse and gargle after use, drink water .  Sputum culture , Sputum [employer]  Continue on Dexilant daily Continue on Pepcid at bedtime Continue on Astelin nasal spray at bedtime Delsym 2 teaspoons twice daily as needed for cough Continue on Singulair daily Continue on Zyrtec daily COVID 19 testing . -PEC referral process.  Follow up with Dr. Lake Bells in 4-6 weeks with PFT  Please contact office for sooner follow up if symptoms do not improve or worsen or seek emergency care

## 2018-07-29 NOTE — Telephone Encounter (Signed)
Still awaiting results of Covid testing  Tammy, once results are back and if are negative did you want her to come and pick up a spacer device? Please advise thanks

## 2018-07-29 NOTE — Telephone Encounter (Signed)
No can just send to pharmacy they have them there  # 1 spacer , use as directed with MDI   Please contact office for sooner follow up if symptoms do not improve or worsen or seek emergency care

## 2018-07-29 NOTE — Assessment & Plan Note (Signed)
Cont current regimen

## 2018-07-29 NOTE — Telephone Encounter (Signed)
Patient called, left VM on cell number to call [phone] between the hours 0700-1900 Monday through Friday to schedule for Covid Testing as requested by Rexene Edison, NP at [facility].

## 2018-07-29 NOTE — Telephone Encounter (Signed)
Scheduled for today.

## 2018-07-29 NOTE — Assessment & Plan Note (Signed)
Previous PE in 2015 and 2019  Cont lifelong Xarelto

## 2018-07-29 NOTE — Telephone Encounter (Signed)
Rx for spacer sent  Pt aware

## 2018-07-29 NOTE — Progress Notes (Signed)
Reviewed, agree 
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
## 2018-07-29 NOTE — Assessment & Plan Note (Signed)
Appears compensated   Plan  Patient Instructions  Chest xray and labs today  Restart Dulera 100 2 puffs Twice daily  , brush , rinse and gargle after use, drink water .  Sputum culture , Sputum AFB  Continue on Dexilant daily Continue on Pepcid at bedtime Continue on Astelin nasal spray at bedtime Delsym 2 teaspoons twice daily as needed for cough Continue on Singulair daily Continue on Zyrtec daily COVID 19 testing . -PEC referral process.  Follow up with Ebony Scott in 4-6 weeks with PFT  Please contact office for sooner follow up if symptoms do not improve or worsen or seek emergency care

## 2018-07-29 NOTE — Assessment & Plan Note (Signed)
Slow to resolve exacerbation -patient has had a very tough year and is extremely deconditioned and has gained approximately 25 pounds while on prednisone.  Her hospitalization last year was complicated by C. difficile will hold on additional antibiotics currently.  Check a sputum culture and sputum AFB.  Check chest x-rays.       Restart Dulera .  Low suspicion for COVID but due to resp sx will check   Patient Instructions  Chest xray and labs today  Restart Dulera 100 2 puffs Twice daily  , brush , rinse and gargle after use, drink water .  Sputum culture , Sputum AFB  Continue on Dexilant daily Continue on Pepcid at bedtime Continue on Astelin nasal spray at bedtime Delsym 2 teaspoons twice daily as needed for cough Continue on Singulair daily Continue on Zyrtec daily COVID 19 testing . -PEC referral process.  Follow up with Dr. Lake Bells in 4-6 weeks with PFT  Please contact office for sooner follow up if symptoms do not improve or worsen or seek emergency care

## 2018-07-29 NOTE — Telephone Encounter (Signed)
Patient returned call, advised of the request for Covid Testing, she verbalized understanding. Appointment scheduled for today at 31 at [facility], advised of location and to wear a mask. Order placed.

## 2018-07-29 NOTE — Addendum Note (Signed)
Addended by: Matilde Sprang on: 07/29/2018 11:20 AM   Modules accepted: Orders

## 2018-07-30 LAB — NOVEL CORONAVIRUS, NAA: SARS-CoV-2, NAA: NOT DETECTED

## 2018-08-01 NOTE — Telephone Encounter (Signed)
Mailed copy of COVID Test results to pt's. Residence, per her request.

## 2018-08-09 ENCOUNTER — Other Ambulatory Visit: Payer: Self-pay | Admitting: Allergy and Immunology

## 2018-08-09 ENCOUNTER — Other Ambulatory Visit: Payer: Self-pay | Admitting: Family Medicine

## 2018-08-11 ENCOUNTER — Telehealth: Payer: Self-pay | Admitting: *Deleted

## 2018-08-11 MED FILL — GERHARDT'S BUTT CREAM BULK: 20 days supply | Qty: 60 | Fill #1

## 2018-08-11 NOTE — Telephone Encounter (Signed)
PA done in covermymeds @ 2:03pm for cyproheptadine tab 4mg  it was approved through 02/19/2019

## 2018-08-11 NOTE — Telephone Encounter (Signed)
Requested Prescriptions   Pending Prescriptions Disp Refills  . nystatin (MYCOSTATIN) 100000 UNIT/ML suspension [Pharmacy Med Name: NYSTATIN 100000 UNIT/ML MOUTH SUSP] 473 mL 1    Sig: USE AS DIRECTED 5MLS IN MOUTH OR THROAT 4 TIMES DAILY   Last OV 07/04/2018 Last written 06/10/2018

## 2018-08-12 ENCOUNTER — Telehealth: Payer: Self-pay | Admitting: Adult Health

## 2018-08-12 NOTE — Telephone Encounter (Signed)
Pt's cxr has been faxed to [facility] in [place] attn. Nothing further needed.

## 2018-08-18 DIAGNOSIS — H6123 Impacted cerumen, bilateral: Secondary | ICD-10-CM | POA: Insufficient documentation

## 2018-08-18 DIAGNOSIS — H6240 Otitis externa in other diseases classified elsewhere, unspecified ear: Secondary | ICD-10-CM | POA: Insufficient documentation

## 2018-08-18 DIAGNOSIS — B369 Superficial mycosis, unspecified: Secondary | ICD-10-CM | POA: Insufficient documentation

## 2018-08-18 DIAGNOSIS — H6121 Impacted cerumen, right ear: Secondary | ICD-10-CM | POA: Insufficient documentation

## 2018-08-19 ENCOUNTER — Other Ambulatory Visit: Payer: Self-pay | Admitting: Internal Medicine

## 2018-08-19 NOTE — Telephone Encounter (Signed)
Refill x 3 months ok  If she wants me to refill it more she needs a f/u  Otherwise ask her to get PCP to refill

## 2018-08-19 NOTE — Telephone Encounter (Signed)
Patient informed and said thank you for refilling it.

## 2018-08-19 NOTE — Telephone Encounter (Signed)
May I refill Sir? 

## 2018-08-25 MED FILL — GERHARDT'S BUTT CREAM BULK: 20 days supply | Qty: 60 | Fill #2

## 2018-08-26 IMAGING — DX DG CHEST 2V
2 series · 2 of 2 positions shown · non-contrast
Comparison: CT scan of the chest November 02, 23 teen and chest
x-ray September 15, 2013.

CLINICAL DATA: Cough and shortness of breath for the past 5 days.
History of asthma, breast malignancy.

EXAM:
CHEST  2 VIEW

[chest pa]
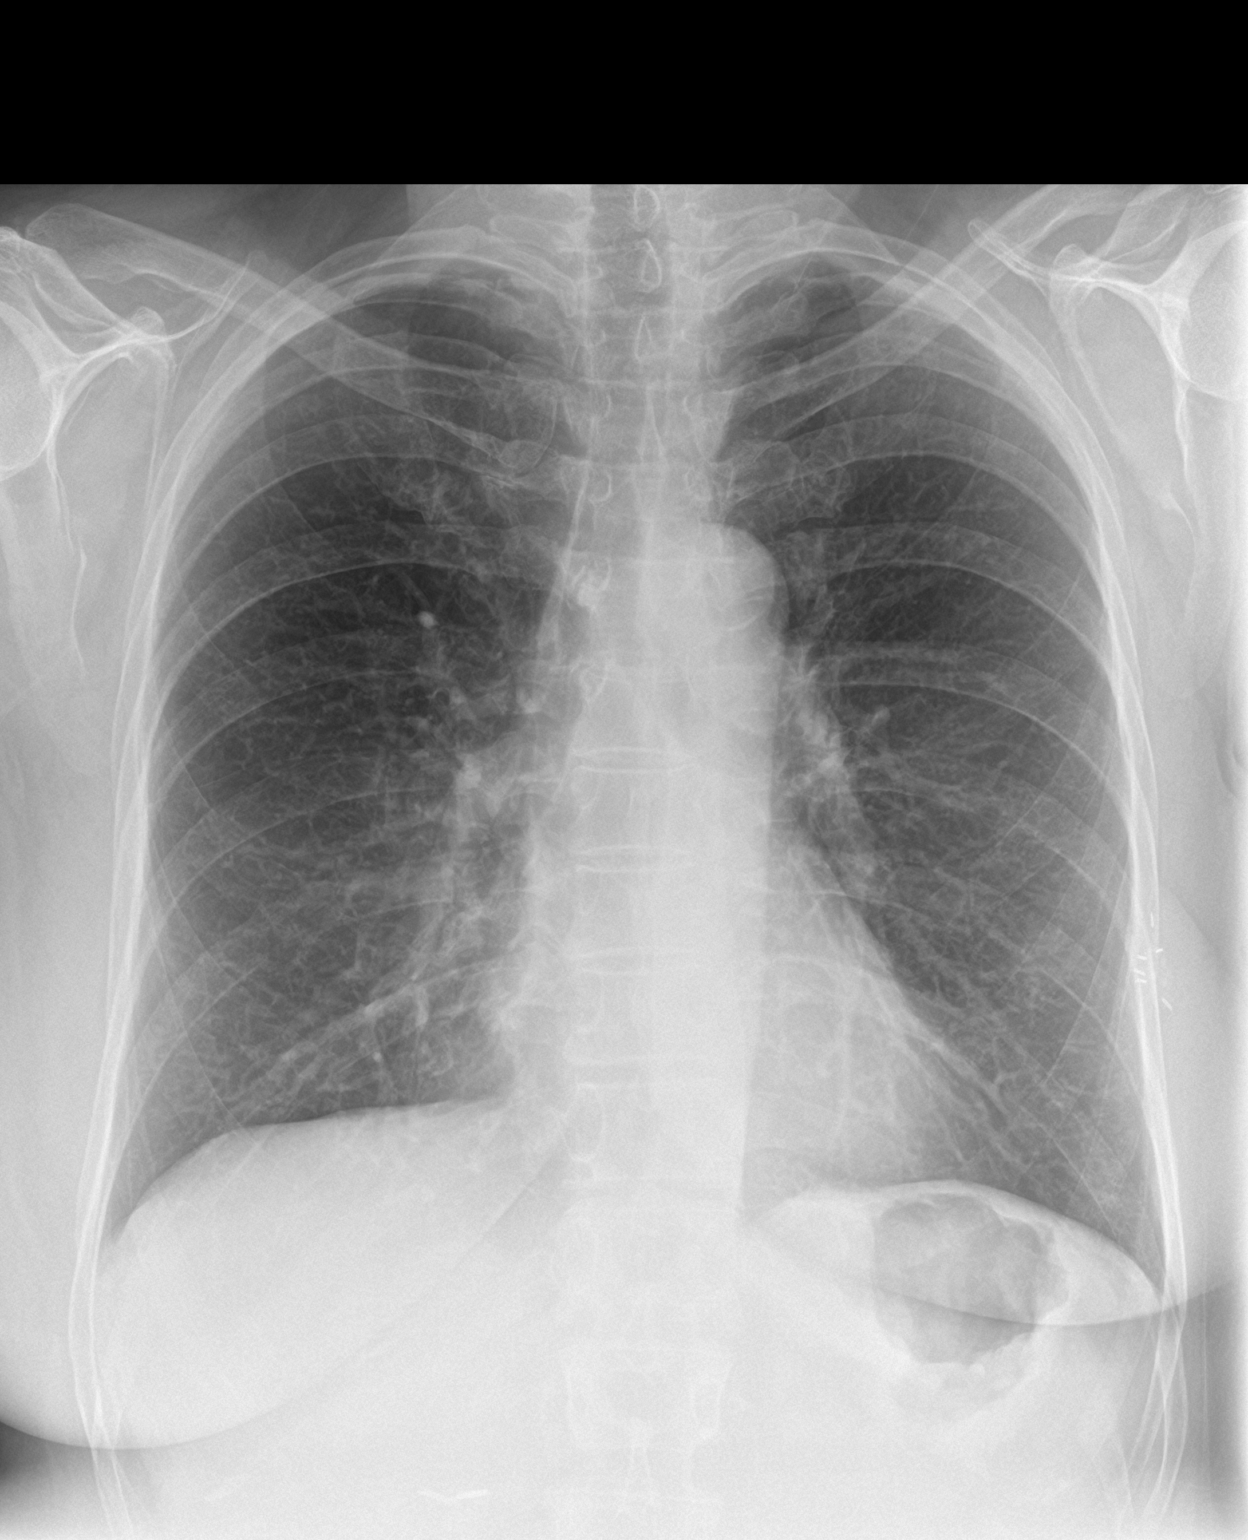

[chest lat]
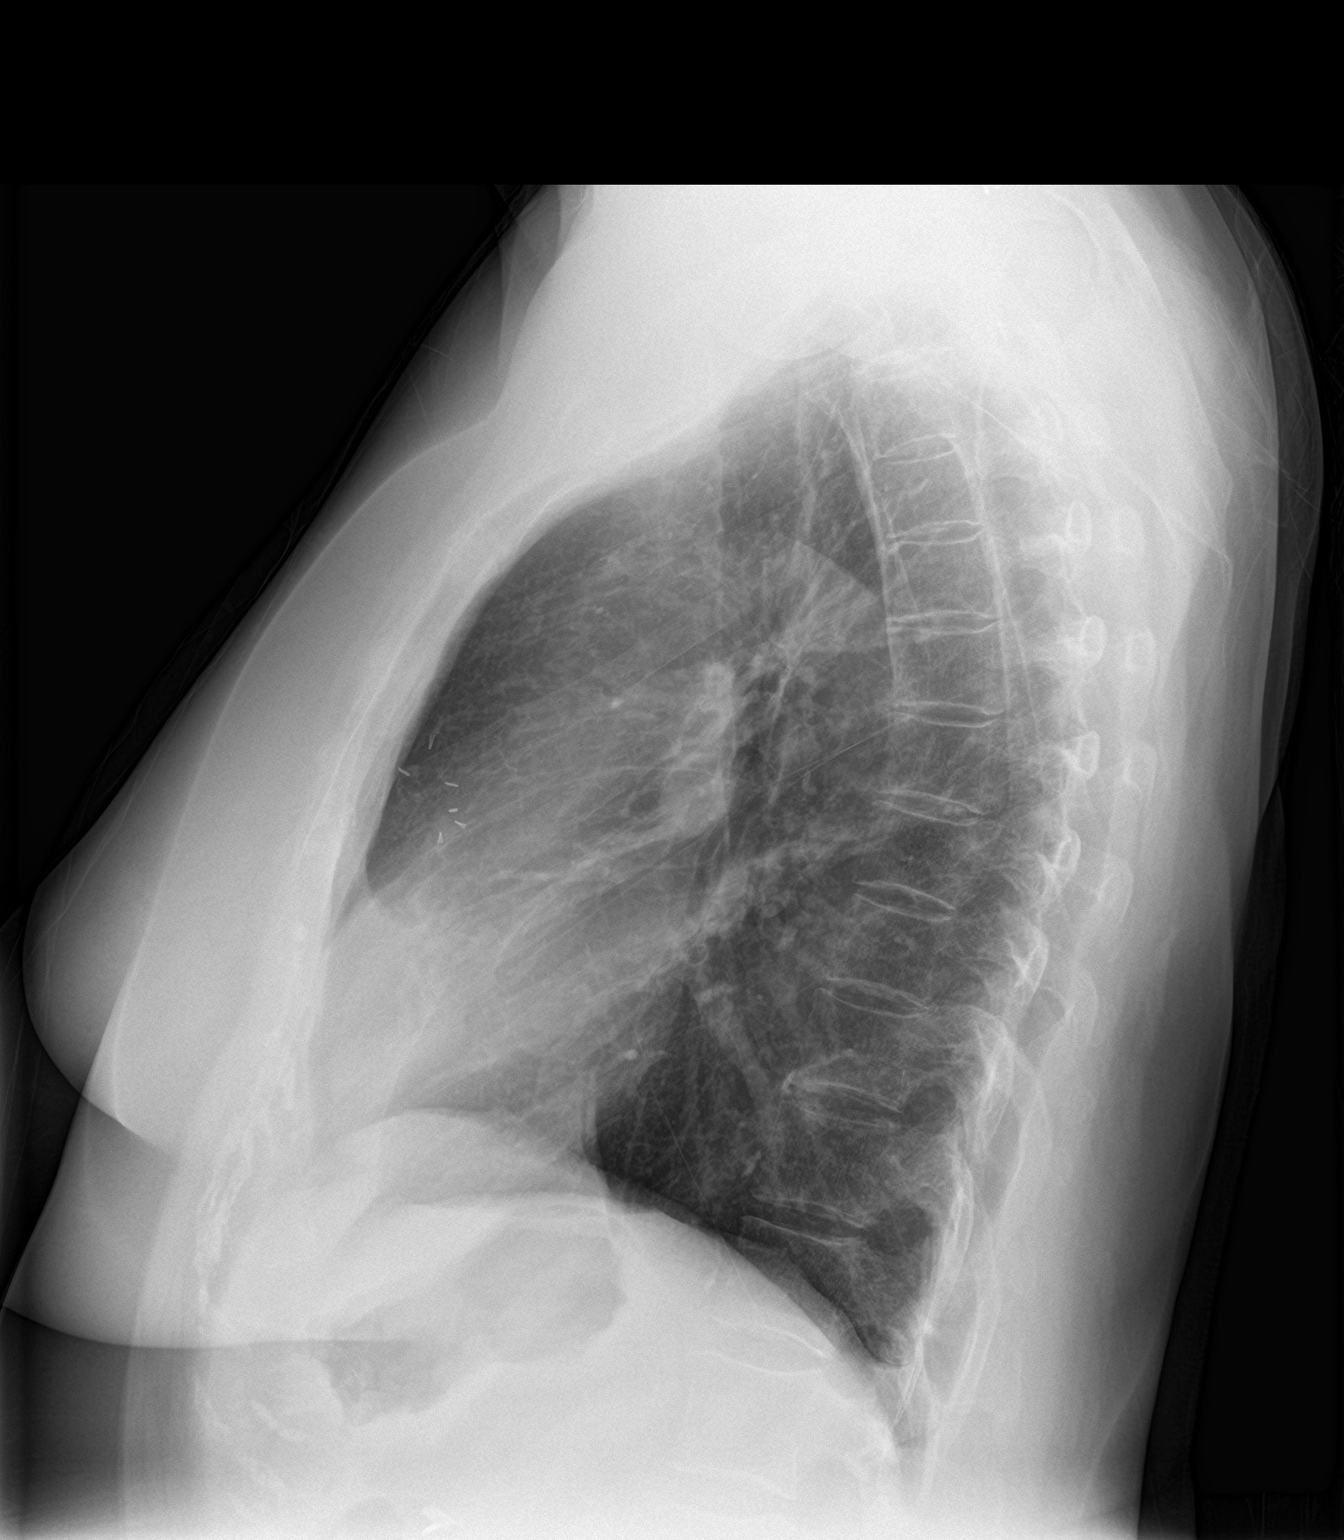

[2 of 2 positions shown; findings below may reference images not displayed]

FINDINGS: The lungs are mildly hyperinflated. There is no focal infiltrate.
There is no pleural effusion. The heart and pulmonary vascularity
are normal. The mediastinum is normal in width. There is
calcification within the wall of the aortic arch. There are surgical
clips overlying the lateral aspect of the left breast. The observed
bony thorax exhibits no acute abnormality.
IMPRESSION: Reactive airway disease. No pneumonia, CHF, nor other acute
cardiopulmonary disease.

Thoracic aortic atherosclerosis.

## 2018-09-03 ENCOUNTER — Other Ambulatory Visit: Payer: Self-pay | Admitting: Allergy and Immunology

## 2018-09-03 ENCOUNTER — Ambulatory Visit: Payer: Medicare Other

## 2018-09-03 DIAGNOSIS — J4550 Severe persistent asthma, uncomplicated: Secondary | ICD-10-CM

## 2018-09-04 ENCOUNTER — Other Ambulatory Visit: Payer: Self-pay | Admitting: Pulmonary Disease

## 2018-09-09 ENCOUNTER — Other Ambulatory Visit: Payer: Self-pay

## 2018-09-09 ENCOUNTER — Encounter: Payer: Self-pay | Admitting: Allergy and Immunology

## 2018-09-09 ENCOUNTER — Telehealth: Payer: Self-pay

## 2018-09-09 ENCOUNTER — Ambulatory Visit (INDEPENDENT_AMBULATORY_CARE_PROVIDER_SITE_OTHER): Payer: Medicare Other | Admitting: Allergy and Immunology

## 2018-09-09 VITALS — BP 142/70 | HR 76 | Temp 97.4°F | Resp 20 | Ht 61.0 in | Wt 149.4 lb

## 2018-09-09 DIAGNOSIS — K219 Gastro-esophageal reflux disease without esophagitis: Secondary | ICD-10-CM

## 2018-09-09 DIAGNOSIS — G500 Trigeminal neuralgia: Secondary | ICD-10-CM | POA: Diagnosis not present

## 2018-09-09 DIAGNOSIS — J4550 Severe persistent asthma, uncomplicated: Secondary | ICD-10-CM | POA: Diagnosis not present

## 2018-09-09 DIAGNOSIS — J3089 Other allergic rhinitis: Secondary | ICD-10-CM | POA: Diagnosis not present

## 2018-09-09 DIAGNOSIS — G478 Other sleep disorders: Secondary | ICD-10-CM

## 2018-09-09 MED ORDER — FAMOTIDINE 20 MG PO TABS: 20.0000 mg | ORAL_TABLET | Freq: Two times a day (BID) | ORAL | 5 refills | Status: DC

## 2018-09-09 MED ORDER — MUPIROCIN 2 % EX OINT: 1.0000 "application " | TOPICAL_OINTMENT | Freq: Every day | CUTANEOUS | Status: DC

## 2018-09-09 MED FILL — GERHARDT'S BUTT CREAM BULK: 20 days supply | Qty: 60 | Fill #3

## 2018-09-09 NOTE — Telephone Encounter (Signed)
Patient's pharmacy is unable to get famotidine 20 mg and they are unsure of eta. Can Ebony Scott take otc famotidine 20 mg if available or omeprazole otc until her normal RX is ready?

## 2018-09-09 NOTE — Patient Instructions (Addendum)
  1. 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 Periactin 4 mg tablet  2 tablets at bedtime  2. 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 Dexilant 60 mg in the morning  B. famotidine 40 mg daily  4. Continue to treat inflammation:   A. Nasonex one spray each nostril 1-2 times a day   B. montelukast 10 mg daily  C. Spiriva respimat - 2 inhalations one time per day  D. Dulera 200-2 inhalations twice a day  5. Use bronchodilator and Astelin and Zyrtec and nasal saline and nasal bactroban if needed  6. Obtain fall flu vaccine (and COVID vaccine)  7. Return to clinic in 6 months or earlier if problem

## 2018-09-09 NOTE — Telephone Encounter (Signed)
Called patient informed to get pepcid OTC patient verbalized understanding

## 2018-09-09 NOTE — Progress Notes (Signed)
[place] [facility] [place] [facility] [place]   Follow-up Note  Referring Provider: [name], MD Primary Provider: [name], MD Date of Office Visit: 09/09/2018  Subjective:   Ebony Scott (DOB: Nov 05, 1950) is a 68 y.o. female who returns to the [facility] on 09/09/2018 in re-evaluation of the following:  HPI: Ebony Scott returns to this clinic in evaluation of asthma and allergic rhinoconjunctivitis and facial pain syndrome and history of reflux.  I last saw her in this clinic on 18 March 2018.  I predominately see Ebony Scott in this clinic for her facial pain syndrome and sleep dysfunction and upper airway atopic disease which appears to be under very good control at this point in time on her current medical therapy which includes cyproheptadine every night and nasal steroids and a leukotriene modifier.  In addition we see her in this clinic for issues tied up with reflux and swallowing dysfunction which appears to be under very good control at this point in time on her current therapy for reflux.  She is followed by Dr. Lake Bells for her asthma.  She is currently using Dulera and Spiriva on a consistent basis.  Her big complaint is the fact that she has dyspnea on exertion.  She is scheduled to have full pulmonary function test with lung volumes next week.  It does not sound as though she has had an exacerbation of her breathing problems requiring an emergency room evaluation but she definitely does have issues with chronic dyspnea.  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  Apparently every time she tapers down below 10 mg of prednisone daily she develops problems with her joints.  Most recently she was started on abatacept this week.   Allergies as of 09/09/2018      Reactions   Dust Mite Extract Shortness Of Breath, Other (See Comments)   "sneezing" (02/20/2012)   Molds & Smuts Shortness Of Breath   Morphine Sulfate Itching   Other Shortness Of Breath, Other (See Comments)   Grass and weeds "sneezing; filled sinuses" (02/20/2012)   Penicillins Rash, Other (See Comments)   "welts" (02/20/2012) Has patient had a PCN reaction causing immediate rash, facial/tongue/throat swelling, SOB or lightheadedness with hypotension: Unknown Has patient had a PCN reaction causing severe rash involving mucus membranes or skin necrosis: No Has patient had a PCN reaction that required hospitalization: No Has patient had a PCN reaction occurring within the last 10 years: No If all of the above answers are "NO", then may proceed with Cephalosporin use.   Reclast [zoledronic Acid] Other (See Comments)   Fever, Put in hospital, dr said it was a reaction from a reaction    Rofecoxib Swelling   Vioxx REACTION: feet swelling   Shrimp Flavor Anaphylaxis   ALL SHELLFISH   Tetracycline Hcl Nausea And Vomiting   Xolair [omalizumab] Other (See Comments)   Caused Blood clot   Dilaudid [hydromorphone Hcl] Itching   Hydrocodone-acetaminophen Nausea And Vomiting   Levofloxacin Other (See Comments)   REACTION: GI upset   Oxycodone Hcl Nausea And Vomiting   Paroxetine Nausea And Vomiting   Paxil   Celecoxib Swelling   Feet swelling   Diltiazem Swelling   Lactose Intolerance (gi) Other (See Comments)   Bloating and gas   Tree Extract Other (See Comments)   "tested and told I was allergic to it; never experienced a reaction to it" (  02/20/2012)      Medication List      acetaminophen 650 MG CR tablet Commonly known as: TYLENOL Take 1,300 mg by mouth every 8 (eight) hours as needed for pain.   AeroChamber Plus inhaler Use as instructed   albuterol 108 (90 Base) MCG/ACT inhaler Commonly known as: ProAir HFA Inhale 2 puffs into the lungs every 4 (four) hours as needed for wheezing or shortness of breath.   Alpha-Lipoic Acid 600 MG Caps Take 600 mg by mouth daily.   alum  & mag hydroxide-simeth 200-200-20 MG/5ML suspension Commonly known as: MAALOX/MYLANTA Take 15 mLs by mouth every 4 (four) hours as needed for indigestion or heartburn.   atorvastatin 10 MG tablet Commonly known as: LIPITOR Take 1 tablet (10 mg total) by mouth daily.   azelastine 0.1 % nasal spray Commonly known as: ASTELIN USE 2 SPRAYS IN EACH NOSTRIL DAILY AS DIRECTED   BEANO PO Take 1 capsule by mouth 3 (three) times daily as needed (gas/ bloating).   CALCIUM-MAGNESIUM PO Take 1 tablet by mouth at bedtime.   cetirizine 10 MG tablet Commonly known as: ZYRTEC Take 5 mg by mouth every evening.   cholecalciferol 25 MCG (1000 UT) tablet Commonly known as: VITAMIN D Take 1,000 Units by mouth daily.   cyproheptadine 4 MG tablet Commonly known as: PERIACTIN TAKE 2 TABLETS BY MOUTH EVERY EVENING   denosumab 60 MG/ML Soln injection Commonly known as: PROLIA Inject 60 mg into the skin every 6 (six) months. Administer in upper arm, thigh, or abdomen   dexlansoprazole 60 MG capsule Commonly known as: DEXILANT Take 1 capsule (60 mg total) by mouth every morning.   diclofenac sodium 1 % Gel Commonly known as: Voltaren Apply 2 g topically 2 (two) times daily.   EPINEPHrine 0.3 mg/0.3 mL Soaj injection Commonly known as: EpiPen 2-Pak Inject 0.3 mLs (0.3 mg total) into the muscle Once PRN for up to 1 dose.   famotidine 20 MG tablet Commonly known as: Pepcid Take 1 tablet (20 mg total) by mouth 2 (two) times daily.   Flutter Devi Use as directed   gabapentin 600 MG tablet Commonly known as: NEURONTIN TAKE 1 TABLET BY MOUTH DAILY   Gerhardt's butt cream Crea Apply 1 application topically 4 (four) times daily.   hydrocortisone 1 % Crea Commonly known as: PROCTOCORT Gerhardts butt cream - Use as directed   hydrocortisone cream 0.5 % Apply 1 application topically 4 (four) times daily. SEE PHARMACY NOTE   Lactaid Fast Act 9000 units Tabs Generic drug: Lactase Take  18,000 Units by mouth 4 (four) times daily as needed (dairy consumption).   leucovorin 5 MG tablet Commonly known as: WELLCOVORIN What changed: Another medication with the same name was removed. Continue taking this medication, and follow the directions you see here. Changed by: Allsion Nogales Kevan Rosebush, MD   lidocaine 5 % Commonly known as: LIDODERM Place 1 patch onto the skin daily as needed (pain). Remove & Discard patch within 12 hours or as directed by MD   methotrexate 2.5 MG tablet Take 15 mg by mouth once a week.   mirtazapine 30 MG disintegrating tablet Commonly known as: REMERON SOL-TAB DISSOLVE 1 TABLET IN MOUTH EVERY NIGHT AT BEDTIME   mometasone-formoterol 200-5 MCG/ACT Aero Commonly known as: DULERA Inhale 2 puffs into the lungs 2 (two) times a day.   montelukast 10 MG tablet Commonly known as: SINGULAIR TAKE 1 TABLET BY MOUTH DAILY   mupirocin ointment 2 % Commonly known as: [employer]  Place 1 application into the nose at bedtime. To prevent nose bleeds   nystatin 100000 UNIT/ML suspension Commonly known as: MYCOSTATIN USE AS DIRECTED 5MLS IN MOUTH OR THROAT 4 TIMES DAILY   predniSONE 5 MG tablet Commonly known as: DELTASONE 10 mg. What changed: Another medication with the same name was removed. Continue taking this medication, and follow the directions you see here. Changed by: Kimbria Camposano Kevan Rosebush, MD   PRESCRIPTION MEDICATION Place 2 puffs into both ears once a week. Place capsule in dispenser and instill 2 puffs in each ear once a week after washing hair. CS (X) AHCL    CHLORAMP/SULFU/AMPHO B/ H DROC 100-100-5 mg capsule - compounded at [facility]   rivaroxaban 20 MG Tabs tablet Commonly known as: XARELTO Take 1 tablet (20 mg total) by mouth daily with supper. What changed: Another medication with the same name was removed. Continue taking this medication, and follow the directions you see here. Changed by: Bryten Maher Kevan Rosebush, MD   SOOTHE OP Place 1 drop into both eyes  2 (two) times daily as needed (dry eyes).   Spiriva Respimat 2.5 MCG/ACT Aers Generic drug: Tiotropium Bromide Monohydrate INHALE 2 PUFFS INTO THE LUNGS DAILY       Past Medical History:  Diagnosis Date  . Allergic rhinitis   . Allergy    SEASONAL  . Anemia   . Anxiety    pt denies  . Arthritis    "mostly the hands" (02/20/2012)  . Asthma   . Breast cancer ([place]) 1998   in remission  . Cataract    REMOVED  . Chronic lower back pain   . Chronic neck pain   . Complication of anesthesia    "had hard time waking up from it several times" (02/20/2012)  . Depression    "some; don't take anything for it" (02/20/2012)  . Diverticulosis   . DVT (deep venous thrombosis) ([place])   . Exertional dyspnea   . Fibromyalgia 11/2011  . GERD (gastroesophageal reflux disease)   . Graves disease   . Headache(784.0)    "related to allergies; more at different times during the year" (02/20/2012)  . Hemorrhoids   . Hiatal hernia    back and neck  . Hx of adenomatous colonic polyps 04/12/2016  . Hypercholesteremia    good cholesterol is high  . Hypothyroidism   . IBS (irritable bowel syndrome)   . Moderate persistent asthma    -FeV1 72% 2011, -IgE 102 2011, CT sinus Neg 2011  . Osteoporosis    on reclast yearly  . Pneumonia 04/2011; ~ 11/2011   "double; single" (02/20/2012)    Past Surgical History:  Procedure Laterality Date  . ANTERIOR AND POSTERIOR REPAIR  1990's  . APPENDECTOMY    . BREAST LUMPECTOMY  1998   left  . CARPOMETACARPEL ([place]) FUSION OF THUMB WITH AUTOGRAFT FROM RADIUS  ~ 2009   "both thumbs" (02/20/2012)  . CATARACT EXTRACTION W/ INTRAOCULAR LENS  IMPLANT, BILATERAL  2012  . CERVICAL DISCECTOMY  10/2001   C5-C6  . CERVICAL FUSION  2003   C3-C4  . CHOLECYSTECTOMY    . COLONOSCOPY    . DEBRIDEMENT TENNIS ELBOW  ?1970's   right  . ESOPHAGOGASTRODUODENOSCOPY    . HYSTERECTOMY    . KNEE ARTHROPLASTY  ?1990's   "?right; w/cartilage repair" (02/20/2012)  . NASAL SEPTUM SURGERY   1980's  . POSTERIOR CERVICAL FUSION/FORAMINOTOMY  2004   "failed initial fusion; rewired  anterior neck" (02/20/2012)  . TONSILLECTOMY  ~  [place] FISTULA CLOSURE W/ TAH  1988  . VIDEO BRONCHOSCOPY Bilateral 08/23/2016   Procedure: VIDEO BRONCHOSCOPY WITH FLUORO;  Surgeon: Javier Glazier, MD;  Location: [facility];  Service: Cardiopulmonary;  Laterality: Bilateral;    Review of systems negative except as noted in HPI / PMHx or noted below:  Review of Systems  Constitutional: Negative.   HENT: Negative.   Eyes: Negative.   Respiratory: Negative.   Cardiovascular: Negative.   Gastrointestinal: Negative.   Genitourinary: Negative.   Musculoskeletal: Negative.   Skin: Negative.   Neurological: Negative.   Endo/Heme/Allergies: Negative.   Psychiatric/Behavioral: Negative.      Objective:   Vitals:   09/09/18 1105  BP: (!) 142/70  Pulse: 76  Resp: 20  Temp: (!) 97.4 F (36.3 C)  SpO2: 97%   Height: 5\' 1"  (154.9 cm)  Weight: 149 lb 6.4 oz (67.8 kg)   Physical Exam Constitutional:      Appearance: She is not diaphoretic.     Comments: Moon face, buffalo hump  HENT:     Head: Normocephalic.     Right Ear: Tympanic membrane, ear canal and external ear normal.     Left Ear: Tympanic membrane, ear canal and external ear normal.     Nose: Nose normal. No mucosal edema or rhinorrhea.     Mouth/Throat:     Pharynx: Uvula midline. No oropharyngeal exudate.  Eyes:     Conjunctiva/sclera: Conjunctivae normal.  Neck:     Thyroid: No thyromegaly.     Trachea: Trachea normal. No tracheal tenderness or tracheal deviation.  Cardiovascular:     Rate and Rhythm: Normal rate and regular rhythm.     Heart sounds: Normal heart sounds, S1 normal and S2 normal. No murmur.  Pulmonary:     Effort: No respiratory distress.     Breath sounds: Normal breath sounds. No stridor. No wheezing or rales.  Lymphadenopathy:     Head:     Right side of head: No tonsillar  adenopathy.     Left side of head: No tonsillar adenopathy.     Cervical: No cervical adenopathy.  Skin:    Findings: No erythema or rash.     Nails: There is no clubbing.   Neurological:     Mental Status: She is alert.     Diagnostics:    Spirometry was performed and demonstrated an FEV1 of 1.41 at 71 % of predicted.  The patient had an Asthma Control Test with the following results: ACT Total Score: 12.    Oxygen saturation on room air at rest was 97%.  Oxygen saturation on room air walking up and down the hallway was 93%.  Assessment and Plan:   1. Facial pain syndrome   2. Other allergic rhinitis   3. LPRD (laryngopharyngeal reflux disease)   4. Asthma, severe persistent, well-controlled   5. Sleep dysfunction with sleep stage disturbance     1. 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 Periactin 4 mg tablet  2 tablets at bedtime  2. 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 Dexilant 60 mg in the morning  B. famotidine 40 mg daily  4. Continue to treat inflammation:   A. Nasonex one spray each nostril 1-2 times a day   B. montelukast 10 mg daily  C. Spiriva respimat - 2 inhalations one time per day  D. Dulera 200-2 inhalations twice a day  5. Use bronchodilator and Astelin and Zyrtec and  nasal saline and nasal bactroban if needed  6. Obtain fall flu vaccine (and COVID vaccine)  7. Return to clinic in 6 months or earlier if problem   Beverly appears to be doing well from her facial pain and sleep dysfunction issue and with her reflux.  Certainly her airway issue is still active.  Hard to say exactly what is causing this problem.  She certainly did not deoxygenated when we exercised her in the hallway today which is a good sign.  She will follow-up with Dr. Lake Bells concerning further management of this issue.  I will see her back in this clinic in 6 months or earlier if there is a problem.  Allena Katz, MD Allergy / Immunology [facility]

## 2018-09-09 NOTE — Telephone Encounter (Signed)
Patient called stating she was seen today and she needs her refills sent in to [facility].  mupirocin ointment  Also her reflux med is not in stock at the pharmacy and they would like to change it to omeprazole.

## 2018-09-09 NOTE — Telephone Encounter (Signed)
Use OTC pepecid

## 2018-09-10 ENCOUNTER — Encounter: Payer: Self-pay | Admitting: Allergy and Immunology

## 2018-09-11 ENCOUNTER — Other Ambulatory Visit: Payer: Self-pay

## 2018-09-12 ENCOUNTER — Ambulatory Visit (INDEPENDENT_AMBULATORY_CARE_PROVIDER_SITE_OTHER): Payer: Medicare Other | Admitting: Family Medicine

## 2018-09-12 ENCOUNTER — Telehealth: Payer: Self-pay | Admitting: Adult Health

## 2018-09-12 ENCOUNTER — Encounter: Payer: Self-pay | Admitting: Family Medicine

## 2018-09-12 VITALS — BP 140/70 | HR 86 | Temp 98.5°F | Resp 22 | Ht 62.0 in | Wt 149.0 lb

## 2018-09-12 DIAGNOSIS — B078 Other viral warts: Secondary | ICD-10-CM | POA: Diagnosis not present

## 2018-09-12 DIAGNOSIS — B079 Viral wart, unspecified: Secondary | ICD-10-CM

## 2018-09-12 DIAGNOSIS — M810 Age-related osteoporosis without current pathological fracture: Secondary | ICD-10-CM | POA: Diagnosis not present

## 2018-09-12 MED ORDER — DENOSUMAB 60 MG/ML ~~LOC~~ SOSY
60.0000 mg | PREFILLED_SYRINGE | Freq: Once | SUBCUTANEOUS | Status: AC
  Administered 2018-09-12: 60 mg via SUBCUTANEOUS

## 2018-09-12 NOTE — Telephone Encounter (Signed)
Spoke with pharmacist at [facility], clarified drug directions.  Nothing further needed at this time- will close encounter.

## 2018-09-12 NOTE — Progress Notes (Signed)
Subjective:    Patient ID: Ebony Scott, female    DOB: 04-09-1950, 68 y.o.   MRN: [id number]  HPI Patient has a cutaneous wart in the base between her right first and second toes.  It is on the medial surface of the DIP joint.  Is approximately 5 mm in diameter.  Patient has been applying salicylic acid daily and it has soften the wart up considerably.  There is a central crater surrounded by white macerated skin.  It is painful to the touch.  She is requesting cryotherapy.  Using a razor blade, I gently remove the surrounding white macerated skin down to the base of the wart.  I then froze the wart with liquid nitrogen cryotherapy for approximately 30 seconds.  I then wrapped the wart with Neosporin and a Band-Aid. Past Medical History:  Diagnosis Date  . Allergic rhinitis   . Allergy    SEASONAL  . Anemia   . Anxiety    pt denies  . Arthritis    "mostly the hands" (02/20/2012)  . Asthma   . Breast cancer ([place]) 1998   in remission  . Cataract    REMOVED  . Chronic lower back pain   . Chronic neck pain   . Complication of anesthesia    "had hard time waking up from it several times" (02/20/2012)  . Depression    "some; don't take anything for it" (02/20/2012)  . Diverticulosis   . DVT (deep venous thrombosis) ([place])   . Exertional dyspnea   . Fibromyalgia 11/2011  . GERD (gastroesophageal reflux disease)   . Graves disease   . Headache(784.0)    "related to allergies; more at different times during the year" (02/20/2012)  . Hemorrhoids   . Hiatal hernia    back and neck  . Hx of adenomatous colonic polyps 04/12/2016  . Hypercholesteremia    good cholesterol is high  . Hypothyroidism   . IBS (irritable bowel syndrome)   . Moderate persistent asthma    -FeV1 72% 2011, -IgE 102 2011, CT sinus Neg 2011  . Osteoporosis    on reclast yearly  . Pneumonia 04/2011; ~ 11/2011   "double; single" (02/20/2012)   Past Surgical History:  Procedure Laterality Date  . ANTERIOR AND POSTERIOR  REPAIR  1990's  . APPENDECTOMY    . BREAST LUMPECTOMY  1998   left  . CARPOMETACARPEL ([place]) FUSION OF THUMB WITH AUTOGRAFT FROM RADIUS  ~ 2009   "both thumbs" (02/20/2012)  . CATARACT EXTRACTION W/ INTRAOCULAR LENS  IMPLANT, BILATERAL  2012  . CERVICAL DISCECTOMY  10/2001   C5-C6  . CERVICAL FUSION  2003   C3-C4  . CHOLECYSTECTOMY    . COLONOSCOPY    . DEBRIDEMENT TENNIS ELBOW  ?1970's   right  . ESOPHAGOGASTRODUODENOSCOPY    . HYSTERECTOMY    . KNEE ARTHROPLASTY  ?1990's   "?right; w/cartilage repair" (02/20/2012)  . NASAL SEPTUM SURGERY  1980's  . POSTERIOR CERVICAL FUSION/FORAMINOTOMY  2004   "failed initial fusion; rewired  anterior neck" (02/20/2012)  . TONSILLECTOMY  ~ 1953  . VESICOVAGINAL FISTULA CLOSURE W/ TAH  1988  . VIDEO BRONCHOSCOPY Bilateral 08/23/2016   Procedure: VIDEO BRONCHOSCOPY WITH FLUORO;  Surgeon: Javier Glazier, MD;  Location: [facility];  Service: Cardiopulmonary;  Laterality: Bilateral;   Current Outpatient Medications on File Prior to Visit  Medication Sig Dispense Refill  . acetaminophen (TYLENOL) 650 MG CR tablet Take 1,300 mg by mouth every 8 (eight) hours  as needed for pain.    Marland Kitchen albuterol (PROAIR HFA) 108 (90 Base) MCG/ACT inhaler Inhale 2 puffs into the lungs every 4 (four) hours as needed for wheezing or shortness of breath. 18 g 5  . Alpha-D-Galactosidase (BEANO PO) Take 1 capsule by mouth 3 (three) times daily as needed (gas/ bloating).     . Alpha-Lipoic Acid 600 MG CAPS Take 600 mg by mouth daily.     Marland Kitchen alum & mag hydroxide-simeth (MAALOX/MYLANTA) 200-200-20 MG/5ML suspension Take 15 mLs by mouth every 4 (four) hours as needed for indigestion or heartburn. 355 mL 0  . atorvastatin (LIPITOR) 10 MG tablet Take 1 tablet (10 mg total) by mouth daily. 90 tablet 3  . azelastine (ASTELIN) 0.1 % nasal spray USE 2 SPRAYS IN EACH NOSTRIL DAILY AS DIRECTED 30 mL 5  . CALCIUM-MAGNESIUM PO Take 1 tablet by mouth at bedtime.    . cetirizine (ZYRTEC) 10 MG  tablet Take 5 mg by mouth every evening.     . cholecalciferol (VITAMIN D) 25 MCG (1000 UT) tablet Take 1,000 Units by mouth daily.     . cyproheptadine (PERIACTIN) 4 MG tablet TAKE 2 TABLETS BY MOUTH EVERY EVENING 60 tablet 0  . denosumab (PROLIA) 60 MG/ML SOLN injection Inject 60 mg into the skin every 6 (six) months. Administer in upper arm, thigh, or abdomen    . dexlansoprazole (DEXILANT) 60 MG capsule Take 1 capsule (60 mg total) by mouth every morning. 30 capsule 5  . diclofenac sodium (VOLTAREN) 1 % GEL Apply 2 g topically 2 (two) times daily. 1 Tube 5  . EPINEPHrine (EPIPEN 2-PAK) 0.3 mg/0.3 mL IJ SOAJ injection Inject 0.3 mLs (0.3 mg total) into the muscle Once PRN for up to 1 dose. 1 Device 1  . famotidine (PEPCID) 20 MG tablet Take 1 tablet (20 mg total) by mouth 2 (two) times daily. 60 tablet 5  . gabapentin (NEURONTIN) 600 MG tablet TAKE 1 TABLET BY MOUTH DAILY 90 tablet 3  . Hydrocortisone (GERHARDT'S BUTT CREAM) CREA Apply 1 application topically 4 (four) times daily. 60 each 1  . hydrocortisone (PROCTOCORT) 1 % CREA Gerhardts butt cream - Use as directed 60 g 1  . hydrocortisone cream 0.5 % Apply 1 application topically 4 (four) times daily. SEE PHARMACY NOTE 60 g 5  . Lactase (LACTAID FAST ACT) 9000 units TABS Take 18,000 Units by mouth 4 (four) times daily as needed (dairy consumption).     Marland Kitchen leucovorin (WELLCOVORIN) 5 MG tablet     . lidocaine (LIDODERM) 5 % Place 1 patch onto the skin daily as needed (pain). Remove & Discard patch within 12 hours or as directed by MD    . methotrexate 2.5 MG tablet Take 15 mg by mouth once a week.     . mirtazapine (REMERON SOL-TAB) 30 MG disintegrating tablet DISSOLVE 1 TABLET IN MOUTH EVERY NIGHT AT BEDTIME 30 tablet 2  . mometasone-formoterol (DULERA) 200-5 MCG/ACT AERO Inhale 2 puffs into the lungs 2 (two) times a day. 1 Inhaler 3  . montelukast (SINGULAIR) 10 MG tablet TAKE 1 TABLET BY MOUTH DAILY 30 tablet 0  . mupirocin ointment  (BACTROBAN) 2 % Place 1 application into the nose at bedtime. To prevent nose bleeds 22 g   . nystatin (MYCOSTATIN) 100000 UNIT/ML suspension USE AS DIRECTED 5MLS IN MOUTH OR THROAT 4 TIMES DAILY 473 mL 1  . predniSONE (DELTASONE) 5 MG tablet 10 mg.    . PRESCRIPTION MEDICATION Place 2 puffs into  both ears once a week. Place capsule in dispenser and instill 2 puffs in each ear once a week after washing hair. CS (X) AHCL    CHLORAMP/SULFU/AMPHO B/ H DROC 100-100-5 mg capsule - compounded at [facility]    . Propylene Glycol-Glycerin (SOOTHE OP) Place 1 drop into both eyes 2 (two) times daily as needed (dry eyes).     Marland Kitchen Respiratory Therapy Supplies (FLUTTER) DEVI Use as directed 1 each 0  . rivaroxaban (XARELTO) 20 MG TABS tablet Take 1 tablet (20 mg total) by mouth daily with supper. 90 tablet 3  . Spacer/Aero-Holding Chambers (AEROCHAMBER PLUS) inhaler Use as instructed 1 each 0  . SPIRIVA RESPIMAT 2.5 MCG/ACT AERS INHALE 2 PUFFS INTO THE LUNGS DAILY 4 g 0   No current facility-administered medications on file prior to visit.    Allergies  Allergen Reactions  . Dust Mite Extract Shortness Of Breath and Other (See Comments)    "sneezing" (02/20/2012)  . Molds & Smuts Shortness Of Breath  . Morphine Sulfate Itching  . Other Shortness Of Breath and Other (See Comments)    Grass and weeds "sneezing; filled sinuses" (02/20/2012)  . Penicillins Rash and Other (See Comments)    "welts" (02/20/2012) Has patient had a PCN reaction causing immediate rash, facial/tongue/throat swelling, SOB or lightheadedness with hypotension: Unknown Has patient had a PCN reaction causing severe rash involving mucus membranes or skin necrosis: No Has patient had a PCN reaction that required hospitalization: No Has patient had a PCN reaction occurring within the last 10 years: No If all of the above answers are "NO", then may proceed with Cephalosporin use.   Marland Kitchen Reclast [Zoledronic Acid] Other (See Comments)    Fever,  Put in hospital, dr said it was a reaction from a reaction   . Rofecoxib Swelling    Vioxx REACTION: feet swelling  . Shrimp Flavor Anaphylaxis    ALL SHELLFISH  . Tetracycline Hcl Nausea And Vomiting  . Xolair [Omalizumab] Other (See Comments)    Caused Blood clot  . Dilaudid [Hydromorphone Hcl] Itching  . Hydrocodone-Acetaminophen Nausea And Vomiting  . Levofloxacin Other (See Comments)    REACTION: GI upset  . Oxycodone Hcl Nausea And Vomiting  . Paroxetine Nausea And Vomiting    Paxil   . Celecoxib Swelling    Feet swelling  . Diltiazem Swelling  . Lactose Intolerance (Gi) Other (See Comments)    Bloating and gas  . Tree Extract Other (See Comments)    "tested and told I was allergic to it; never experienced a reaction to it" (02/20/2012)   Social History   Socioeconomic History  . Marital status: Divorced    Spouse name: Not on file  . Number of children: 2  . Years of education: college  . Highest education level: Not on file  Occupational History  . Occupation: Disabled    Comment: Retired Engineer, production: RETIRED  Social Needs  . Financial resource strain: Not on file  . Food insecurity    Worry: Not on file    Inability: Not on file  . Transportation needs    Medical: Not on file    Non-medical: Not on file  Tobacco Use  . Smoking status: Passive Smoke Exposure - Never Smoker  . Smokeless tobacco: Never Used  . Tobacco comment: Parents  Substance and Sexual Activity  . Alcohol use: Not Currently    Alcohol/week: 0.0 standard drinks  . Drug use: No  .  Sexual activity: Never  Lifestyle  . Physical activity    Days per week: Not on file    Minutes per session: Not on file  . Stress: Not on file  Relationships  . Social Herbalist on phone: Not on file    Gets together: Not on file    Attends religious service: Not on file    Active member of club or organization: Not on file    Attends meetings of clubs or  organizations: Not on file    Relationship status: Not on file  . Intimate partner violence    Fear of current or ex partner: Not on file    Emotionally abused: Not on file    Physically abused: Not on file    Forced sexual activity: Not on file  Other Topics Concern  . Not on file  Social History Narrative   Patient lives at home alone. Patient  divorced.    Patient has her BS degree.   Right handed.   Caffeine- sometimes coffee.      [facility]:   Born in [place], [place]. She worked as a Copywriter, advertising. She has no pets currently. She does have indoor plants. Previously had mold in her home that was remediated. Carpet was removed.            Review of Systems  All other systems reviewed and are negative.      Objective:   Physical Exam Vitals signs reviewed.  Constitutional:      General: She is not in acute distress.    Appearance: Normal appearance. She is well-developed. She is not ill-appearing or toxic-appearing.  Cardiovascular:     Rate and Rhythm: Normal rate and regular rhythm.     Heart sounds: Normal heart sounds.  Pulmonary:     Effort: Pulmonary effort is normal. No respiratory distress.     Breath sounds: No stridor or decreased air movement. Wheezing present. No rales.  Musculoskeletal:       Feet:  Neurological:     Mental Status: She is alert.          Assessment & Plan:  The encounter diagnosis was Verruca vulgaris. The wart was debulked using a razor blade as mentioned in the history of present illness and then the base of the wart was treated with liquid nitrogen cryotherapy.  Recommended daily dressing changes with Neosporin and a Band-Aid until healed.  If there is residual wart, she could then treat this with topical salicylic acid until completely resolved.

## 2018-09-12 NOTE — Addendum Note (Signed)
Addended by: Shary Decamp B on: 09/12/2018 03:34 PM   Modules accepted: Orders

## 2018-09-15 ENCOUNTER — Other Ambulatory Visit (HOSPITAL_COMMUNITY)
Admission: RE | Admit: 2018-09-15 | Discharge: 2018-09-15 | Disposition: A | Discharge status: Home / Self Care | Payer: Medicare Other | Source: Ambulatory Visit | Attending: Pulmonary Disease | Admitting: Pulmonary Disease

## 2018-09-15 ENCOUNTER — Encounter: Payer: Self-pay | Admitting: Family Medicine

## 2018-09-15 DIAGNOSIS — Z20828 Contact with and (suspected) exposure to other viral communicable diseases: Secondary | ICD-10-CM | POA: Insufficient documentation

## 2018-09-15 DIAGNOSIS — M069 Rheumatoid arthritis, unspecified: Secondary | ICD-10-CM | POA: Insufficient documentation

## 2018-09-15 DIAGNOSIS — M0600 Rheumatoid arthritis without rheumatoid factor, unspecified site: Secondary | ICD-10-CM | POA: Insufficient documentation

## 2018-09-15 LAB — SARS CORONAVIRUS 2 (TAT 6-24 HRS): SARS Coronavirus 2: NEGATIVE

## 2018-09-18 ENCOUNTER — Other Ambulatory Visit: Payer: Self-pay

## 2018-09-18 ENCOUNTER — Ambulatory Visit: Payer: Medicare Other | Admitting: Adult Health

## 2018-09-18 ENCOUNTER — Ambulatory Visit (INDEPENDENT_AMBULATORY_CARE_PROVIDER_SITE_OTHER): Payer: Medicare Other | Admitting: Pulmonary Disease

## 2018-09-18 DIAGNOSIS — J45901 Unspecified asthma with (acute) exacerbation: Secondary | ICD-10-CM | POA: Diagnosis not present

## 2018-09-18 LAB — PULMONARY FUNCTION TEST
DL/VA % pred: 92 %
DL/VA: 3.92 ml/min/mmHg/L
DLCO unc % pred: 77 %
DLCO unc: 13.51 ml/min/mmHg
FEF 25-75 Post: 2.08 L/sec
FEF 25-75 Pre: 2.62 L/sec
FEF2575-%Change-Post: -20 %
FEF2575-%Pred-Post: 120 %
FEF2575-%Pred-Pre: 151 %
FEV1-%Change-Post: -4 %
FEV1-%Pred-Post: 73 %
FEV1-%Pred-Pre: 76 %
FEV1-Post: 1.47 L
FEV1-Pre: 1.53 L
FEV1FVC-%Change-Post: 3 %
FEV1FVC-%Pred-Pre: 115 %
FEV6-%Change-Post: -7 %
FEV6-%Pred-Post: 63 %
FEV6-%Pred-Pre: 69 %
FEV6-Post: 1.61 L
FEV6-Pre: 1.74 L
FEV6FVC-%Pred-Post: 104 %
FEV6FVC-%Pred-Pre: 104 %
FVC-%Change-Post: -7 %
FVC-%Pred-Post: 61 %
FVC-%Pred-Pre: 66 %
FVC-Post: 1.61 L
FVC-Pre: 1.74 L
Post FEV1/FVC ratio: 91 %
Post FEV6/FVC ratio: 100 %
Pre FEV1/FVC ratio: 88 %
Pre FEV6/FVC Ratio: 100 %
RV % pred: 79 %
RV: 1.63 L
TLC % pred: 76 %
TLC: 3.52 L

## 2018-09-18 NOTE — Progress Notes (Signed)
PFT completed today.  

## 2018-09-22 ENCOUNTER — Ambulatory Visit (INDEPENDENT_AMBULATORY_CARE_PROVIDER_SITE_OTHER): Payer: Medicare Other | Admitting: Adult Health

## 2018-09-22 ENCOUNTER — Encounter: Payer: Self-pay | Admitting: Adult Health

## 2018-09-22 ENCOUNTER — Other Ambulatory Visit: Payer: Self-pay

## 2018-09-22 DIAGNOSIS — R0602 Shortness of breath: Secondary | ICD-10-CM | POA: Diagnosis not present

## 2018-09-22 DIAGNOSIS — J3089 Other allergic rhinitis: Secondary | ICD-10-CM

## 2018-09-22 DIAGNOSIS — J4550 Severe persistent asthma, uncomplicated: Secondary | ICD-10-CM

## 2018-09-22 DIAGNOSIS — J302 Other seasonal allergic rhinitis: Secondary | ICD-10-CM

## 2018-09-22 DIAGNOSIS — I5032 Chronic diastolic (congestive) heart failure: Secondary | ICD-10-CM | POA: Diagnosis not present

## 2018-09-22 DIAGNOSIS — R05 Cough: Secondary | ICD-10-CM | POA: Diagnosis not present

## 2018-09-22 DIAGNOSIS — I2699 Other pulmonary embolism without acute cor pulmonale: Secondary | ICD-10-CM

## 2018-09-22 DIAGNOSIS — R059 Cough, unspecified: Secondary | ICD-10-CM

## 2018-09-22 DIAGNOSIS — K219 Gastro-esophageal reflux disease without esophagitis: Secondary | ICD-10-CM
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
## 2018-09-22 NOTE — Patient Instructions (Addendum)
HRCT Chest .  Continue on Dulera 100 2 puffs Twice daily  , brush , rinse and gargle after use, drink water .  Continue on Spiriva 2 puffs daily. Rinse after use.  Continue on Dexilant daily Continue on Pepcid at bedtime Continue on Astelin nasal spray at bedtime As needed   Delsym 2 teaspoons twice daily as needed for cough Continue on Singulair daily Continue on Zyrtec daily Follow up with Dr. Lake Bells or Parrett in 3 months and As needed   Please contact office for sooner follow up if symptoms do not improve or worsen or seek emergency care

## 2018-09-23 NOTE — Assessment & Plan Note (Signed)
History of recurrent PE.  On lifelong Xarelto.  Continue on current regimen.

## 2018-09-23 NOTE — Assessment & Plan Note (Signed)
Continue on PPI and Pepcid.  Continue on GERD diet

## 2018-09-23 NOTE — Assessment & Plan Note (Signed)
Controlled on current regimen.   

## 2018-09-23 NOTE — Assessment & Plan Note (Signed)
Severe persistent asthma with worsening shortness of breath.  Patient does not appear to have any acute asthma symptoms.  Pulmonary function testing shows no significant airflow obstruction.  There is some restrictive changes and a slight diffusing defect.  This could be consistent with her increased weight gain.  However with her underlying autoimmune disorder will check a high-resolution CT chest to rule out possible underlying interstitial lung disease.  Plan  Patient Instructions  HRCT Chest .  Continue on Dulera 100 2 puffs Twice daily  , brush , rinse and gargle after use, drink water .  Continue on Spiriva 2 puffs daily. Rinse after use.  Continue on Dexilant daily Continue on Pepcid at bedtime Continue on Astelin nasal spray at bedtime As needed   Delsym 2 teaspoons twice daily as needed for cough Continue on Singulair daily Continue on Zyrtec daily Follow up with Dr. Lake Bells or Parrett in 3 months and As needed   Please contact office for sooner follow up if symptoms do not improve or worsen or seek emergency care

## 2018-09-23 NOTE — Assessment & Plan Note (Signed)
Appears compensated without evidence of overt failure on exam.  Patient has no significant lower extremity swelling.  Labs with BNP last visit was unremarkable.  Patient will continue on her current regimen.

## 2018-09-24 ENCOUNTER — Encounter: Payer: Self-pay | Admitting: Family Medicine

## 2018-10-01 ENCOUNTER — Telehealth: Payer: Self-pay | Admitting: Adult Health

## 2018-10-01 LAB — HM DEXA SCAN

## 2018-10-01 NOTE — Telephone Encounter (Signed)
Called and spoke w/ pt. Pt states she was informed her CT scan would be scheduled for November, but she inquires if she needs to have it done sooner than her appt w/ TP on 12/23/2018 since her breathing has been worse. She states she would like to hear TP's recommendations. I let her know I would get this message routed to TP for follow-up.  TP, please advise if you think pt's CT should be scheduled sooner than November 2020. Pt has a dentist appt tomorrow from 10-12 and asks that triage contact her in the afternoon with TP's response.

## 2018-10-02 MED FILL — GERHARDT'S BUTT CREAM BULK: 20 days supply | Qty: 60 | Fill #4

## 2018-10-02 NOTE — Telephone Encounter (Signed)
Ebony Scott [facility] spoke with patient HRCT has been rescheduled for 9.4.2020 Patient is happy with this new date and time Nothing further needed; will sign off

## 2018-10-02 NOTE — Telephone Encounter (Signed)
Per TP: yes, HRCT should have been scheduled before November - next available.    Message has been sent to the [facility] and Judeen Hammans will assist with moving up HRCT appt and call the patient.

## 2018-10-06 ENCOUNTER — Encounter: Payer: Self-pay | Admitting: *Deleted

## 2018-10-06 ENCOUNTER — Other Ambulatory Visit: Payer: Self-pay | Admitting: Allergy and Immunology

## 2018-10-06 DIAGNOSIS — J4550 Severe persistent asthma, uncomplicated: Secondary | ICD-10-CM

## 2018-10-06 LAB — HM MAMMOGRAPHY

## 2018-10-10 ENCOUNTER — Encounter: Payer: Self-pay | Admitting: Family Medicine

## 2018-10-10 ENCOUNTER — Encounter: Payer: Self-pay | Admitting: *Deleted

## 2018-10-10 MED ORDER — NYSTATIN 100000 UNIT/ML MT SUSP: OROMUCOSAL | 1 refills | Status: DC

## 2018-10-10 MED ORDER — CYPROHEPTADINE HCL 4 MG PO TABS: 8.0000 mg | ORAL_TABLET | Freq: Every evening | ORAL | 0 refills | Status: DC

## 2018-10-14 ENCOUNTER — Encounter: Payer: Self-pay | Admitting: Family Medicine

## 2018-10-15 ENCOUNTER — Other Ambulatory Visit: Payer: Self-pay

## 2018-10-16 ENCOUNTER — Ambulatory Visit (INDEPENDENT_AMBULATORY_CARE_PROVIDER_SITE_OTHER): Payer: Medicare Other | Admitting: Family Medicine

## 2018-10-16 ENCOUNTER — Encounter: Payer: Self-pay | Admitting: Family Medicine

## 2018-10-16 VITALS — BP 156/72 | HR 80 | Temp 98.6°F | Resp 22 | Ht 62.0 in | Wt 152.0 lb

## 2018-10-16 DIAGNOSIS — B078 Other viral warts: Secondary | ICD-10-CM | POA: Diagnosis not present

## 2018-10-16 DIAGNOSIS — G5761 Lesion of plantar nerve, right lower limb: Secondary | ICD-10-CM | POA: Diagnosis not present

## 2018-10-16 DIAGNOSIS — R0781 Pleurodynia: Secondary | ICD-10-CM

## 2018-10-16 DIAGNOSIS — Z23 Encounter for immunization: Secondary | ICD-10-CM | POA: Diagnosis not present

## 2018-10-16 DIAGNOSIS — B079 Viral wart, unspecified: Secondary | ICD-10-CM

## 2018-10-16 MED ORDER — HYDROCODONE-ACETAMINOPHEN 5-325 MG PO TABS: 1.0000 | ORAL_TABLET | Freq: Four times a day (QID) | ORAL | 0 refills | Status: DC | PRN

## 2018-10-16 NOTE — Progress Notes (Signed)
Subjective:    Patient ID: Ebony Scott, female    DOB: [birth year], 68 y.o.   MRN: [id number]  HPI  09/12/18 Patient has a cutaneous wart in the base between her right first and second toes.  It is on the medial surface of the DIP joint.  Is approximately 5 mm in diameter.  Patient has been applying salicylic acid daily and it has soften the wart up considerably.  There is a central crater surrounded by white macerated skin.  It is painful to the touch.  She is requesting cryotherapy.  Using a razor blade, I gently remove the surrounding white macerated skin down to the base of the wart.  I then froze the wart with liquid nitrogen cryotherapy for approximately 30 seconds.  I then wrapped the wart with Neosporin and a Band-Aid.  At that time, my plan was: The wart was debulked using a razor blade as mentioned in the history of present illness and then the base of the wart was treated with liquid nitrogen cryotherapy.  Recommended daily dressing changes with Neosporin and a Band-Aid until healed.  If there is residual wart, she could then treat this with topical salicylic acid until completely resolved.  10/16/18 The wart between her first and second toes on her right foot has essentially healed.  There is a small punctate crater approximately 1 mm in diameter in the center of the previous wart.  I instructed the patient to continue applying salicylic acid until this resolves.  However now the patient is having pain in the webspace between the second and third toe.  The pain radiates up into the foot between the metatarsals.  She has no pain to palpation over the bodies of the second third and fourth metatarsals.  She has no tenderness to hard palpation or thumping on the metatarsal heads.  There is no palpable abnormality.  There is no erythema.  There is no swelling.  Pain is neuropathic in nature and raises suspicion for Morton's neuroma.  Patient also states that she was coughing hard last night and felt  a pop in her left ribs.  The pain is located in her left flank.  She denies any hemoptysis.  She denies any new shortness of breath.  She has a chronic cough. Past Medical History:  Diagnosis Date  . Allergic rhinitis   . Allergy    SEASONAL  . Anemia   . Anxiety    pt denies  . Asthma   . Breast cancer ([place]) 1998   in remission  . Cataract    REMOVED  . Complication of anesthesia    "had hard time waking up from it several times" (02/20/2012)  . Depression    "some; don't take anything for it" (02/20/2012)  . Diverticulosis   . DVT (deep venous thrombosis) ([place])   . Exertional dyspnea   . Fibromyalgia 11/2011  . GERD (gastroesophageal reflux disease)   . Graves disease   . Headache(784.0)    "related to allergies; more at different times during the year" (02/20/2012)  . Hemorrhoids   . Hiatal hernia    back and neck  . Hx of adenomatous colonic polyps 04/12/2016  . Hypercholesteremia    good cholesterol is high  . Hypothyroidism   . IBS (irritable bowel syndrome)   . Moderate persistent asthma    -FeV1 72% 2011, -IgE 102 2011, CT sinus Neg 2011  . Osteoporosis    on reclast yearly  . Pneumonia 04/2011; ~  11/2011   "double; single" (02/20/2012)  . Seronegative rheumatoid arthritis ([place])    Dr. Lahoma Rocker   Past Surgical History:  Procedure Laterality Date  . ANTERIOR AND POSTERIOR REPAIR  1990's  . APPENDECTOMY    . BREAST LUMPECTOMY  1998   left  . CARPOMETACARPEL ([place]) FUSION OF THUMB WITH AUTOGRAFT FROM RADIUS  ~ 2009   "both thumbs" (02/20/2012)  . CATARACT EXTRACTION W/ INTRAOCULAR LENS  IMPLANT, BILATERAL  2012  . CERVICAL DISCECTOMY  10/2001   C5-C6  . CERVICAL FUSION  2003   C3-C4  . CHOLECYSTECTOMY    . COLONOSCOPY    . DEBRIDEMENT TENNIS ELBOW  ?1970's   right  . ESOPHAGOGASTRODUODENOSCOPY    . HYSTERECTOMY    . KNEE ARTHROPLASTY  ?1990's   "?right; w/cartilage repair" (02/20/2012)  . NASAL SEPTUM SURGERY  1980's  . POSTERIOR CERVICAL  FUSION/FORAMINOTOMY  2004   "failed initial fusion; rewired  anterior neck" (02/20/2012)  . TONSILLECTOMY  ~ 1953  . VESICOVAGINAL FISTULA CLOSURE W/ TAH  1988  . VIDEO BRONCHOSCOPY Bilateral 08/23/2016   Procedure: VIDEO BRONCHOSCOPY WITH FLUORO;  Surgeon: Javier Glazier, MD;  Location: [facility];  Service: Cardiopulmonary;  Laterality: Bilateral;   Current Outpatient Medications on File Prior to Visit  Medication Sig Dispense Refill  . Abatacept (ORENCIA [place]) Inject into the skin.    Marland Kitchen acetaminophen (TYLENOL) 650 MG CR tablet Take 1,300 mg by mouth every 8 (eight) hours as needed for pain.    Marland Kitchen albuterol (PROAIR HFA) 108 (90 Base) MCG/ACT inhaler Inhale 2 puffs into the lungs every 4 (four) hours as needed for wheezing or shortness of breath. 18 g 5  . Alpha-D-Galactosidase (BEANO PO) Take 1 capsule by mouth 3 (three) times daily as needed (gas/ bloating).     . Alpha-Lipoic Acid 600 MG CAPS Take 600 mg by mouth daily.     Marland Kitchen alum & mag hydroxide-simeth (MAALOX/MYLANTA) 200-200-20 MG/5ML suspension Take 15 mLs by mouth every 4 (four) hours as needed for indigestion or heartburn. 355 mL 0  . atorvastatin (LIPITOR) 10 MG tablet Take 1 tablet (10 mg total) by mouth daily. 90 tablet 3  . azelastine (ASTELIN) 0.1 % nasal spray USE 2 SPRAYS IN EACH NOSTRIL DAILY AS DIRECTED 30 mL 5  . CALCIUM-MAGNESIUM PO Take 1 tablet by mouth at bedtime.    . cetirizine (ZYRTEC) 10 MG tablet Take 5 mg by mouth every evening.     . cholecalciferol (VITAMIN D) 25 MCG (1000 UT) tablet Take 1,000 Units by mouth daily.     . cyproheptadine (PERIACTIN) 4 MG tablet Take 2 tablets (8 mg total) by mouth every evening. 60 tablet 0  . denosumab (PROLIA) 60 MG/ML SOLN injection Inject 60 mg into the skin every 6 (six) months. Administer in upper arm, thigh, or abdomen    . DEXILANT 60 MG capsule TAKE 1 CAPSULE BY MOUTH EACH MORNING 30 capsule 5  . diclofenac sodium (VOLTAREN) 1 % GEL Apply 2 g topically 2 (two) times  daily. 1 Tube 5  . EPINEPHrine (EPIPEN 2-PAK) 0.3 mg/0.3 mL IJ SOAJ injection Inject 0.3 mLs (0.3 mg total) into the muscle Once PRN for up to 1 dose. 1 Device 1  . famotidine (PEPCID) 20 MG tablet Take 1 tablet (20 mg total) by mouth 2 (two) times daily. 60 tablet 5  . gabapentin (NEURONTIN) 600 MG tablet TAKE 1 TABLET BY MOUTH DAILY 90 tablet 3  . Hydrocortisone (GERHARDT'S BUTT CREAM) CREA  Apply 1 application topically 4 (four) times daily. 60 each 1  . hydrocortisone (PROCTOCORT) 1 % CREA Gerhardts butt cream - Use as directed 60 g 1  . hydrocortisone cream 0.5 % Apply 1 application topically 4 (four) times daily. SEE PHARMACY NOTE 60 g 5  . Lactase (LACTAID FAST ACT) 9000 units TABS Take 18,000 Units by mouth 4 (four) times daily as needed (dairy consumption).     Marland Kitchen leucovorin (WELLCOVORIN) 5 MG tablet     . lidocaine (LIDODERM) 5 % Place 1 patch onto the skin daily as needed (pain). Remove & Discard patch within 12 hours or as directed by MD    . methotrexate 2.5 MG tablet Take 15 mg by mouth once a week.     . mirtazapine (REMERON SOL-TAB) 30 MG disintegrating tablet DISSOLVE 1 TABLET IN MOUTH EVERY NIGHT AT BEDTIME 30 tablet 2  . mometasone-formoterol (DULERA) 200-5 MCG/ACT AERO Inhale 2 puffs into the lungs 2 (two) times a day. 1 Inhaler 3  . montelukast (SINGULAIR) 10 MG tablet TAKE 1 CAPSULE BY MOUTH DAILY 30 tablet 5  . mupirocin ointment (BACTROBAN) 2 % Place 1 application into the nose at bedtime. To prevent nose bleeds 22 g   . nystatin (MYCOSTATIN) 100000 UNIT/ML suspension USE AS DIRECTED 5MLS IN MOUTH OR THROAT 4 TIMES DAILY 473 mL 1  . predniSONE (DELTASONE) 5 MG tablet 10 mg.    . PRESCRIPTION MEDICATION Place 2 puffs into both ears once a week. Place capsule in dispenser and instill 2 puffs in each ear once a week after washing hair. CS (X) AHCL    CHLORAMP/SULFU/AMPHO B/ H DROC 100-100-5 mg capsule - compounded at [facility]    . Propylene Glycol-Glycerin (SOOTHE OP) Place  1 drop into both eyes 2 (two) times daily as needed (dry eyes).     Marland Kitchen Respiratory Therapy Supplies (FLUTTER) DEVI Use as directed 1 each 0  . rivaroxaban (XARELTO) 20 MG TABS tablet Take 1 tablet (20 mg total) by mouth daily with supper. 90 tablet 3  . Spacer/Aero-Holding Chambers (AEROCHAMBER PLUS) inhaler Use as instructed 1 each 0  . SPIRIVA RESPIMAT 2.5 MCG/ACT AERS INHALE 2 PUFFS INTO THE LUNGS DAILY 4 g 5   No current facility-administered medications on file prior to visit.    Allergies  Allergen Reactions  . Dust Mite Extract Shortness Of Breath and Other (See Comments)    "sneezing" (02/20/2012)  . Molds & Smuts Shortness Of Breath  . Morphine Sulfate Itching  . Other Shortness Of Breath and Other (See Comments)    Grass and weeds "sneezing; filled sinuses" (02/20/2012)  . Penicillins Rash and Other (See Comments)    "welts" (02/20/2012) Has patient had a PCN reaction causing immediate rash, facial/tongue/throat swelling, SOB or lightheadedness with hypotension: Unknown Has patient had a PCN reaction causing severe rash involving mucus membranes or skin necrosis: No Has patient had a PCN reaction that required hospitalization: No Has patient had a PCN reaction occurring within the last 10 years: No If all of the above answers are "NO", then may proceed with Cephalosporin use.   Marland Kitchen Reclast [Zoledronic Acid] Other (See Comments)    Fever, Put in hospital, dr said it was a reaction from a reaction   . Rofecoxib Swelling    Vioxx REACTION: feet swelling  . Shrimp Flavor Anaphylaxis    ALL SHELLFISH  . Tetracycline Hcl Nausea And Vomiting  . Xolair [Omalizumab] Other (See Comments)    Caused Blood clot  .  Dilaudid [Hydromorphone Hcl] Itching  . Hydrocodone-Acetaminophen Nausea And Vomiting  . Levofloxacin Other (See Comments)    REACTION: GI upset  . Oxycodone Hcl Nausea And Vomiting  . Paroxetine Nausea And Vomiting    Paxil   . Celecoxib Swelling    Feet swelling  .  Diltiazem Swelling  . Lactose Intolerance (Gi) Other (See Comments)    Bloating and gas  . Tree Extract Other (See Comments)    "tested and told I was allergic to it; never experienced a reaction to it" (02/20/2012)   Social History   Socioeconomic History  . Marital status: Divorced    Spouse name: Not on file  . Number of children: 2  . Years of education: college  . Highest education level: Not on file  Occupational History  . Occupation: Disabled    Comment: Retired Engineer, production: RETIRED  Social Needs  . Financial resource strain: Not on file  . Food insecurity    Worry: Not on file    Inability: Not on file  . Transportation needs    Medical: Not on file    Non-medical: Not on file  Tobacco Use  . Smoking status: Passive Smoke Exposure - Never Smoker  . Smokeless tobacco: Never Used  . Tobacco comment: Parents  Substance and Sexual Activity  . Alcohol use: Not Currently    Alcohol/week: 0.0 standard drinks  . Drug use: No  . Sexual activity: Never  Lifestyle  . Physical activity    Days per week: Not on file    Minutes per session: Not on file  . Stress: Not on file  Relationships  . Social Herbalist on phone: Not on file    Gets together: Not on file    Attends religious service: Not on file    Active member of club or organization: Not on file    Attends meetings of clubs or organizations: Not on file    Relationship status: Not on file  . Intimate partner violence    Fear of current or ex partner: Not on file    Emotionally abused: Not on file    Physically abused: Not on file    Forced sexual activity: Not on file  Other Topics Concern  . Not on file  Social History Narrative   Patient lives at home alone. Patient  divorced.    Patient has her BS degree.   Right handed.   Caffeine- sometimes coffee.      [facility]:   Born in [place], [place]. She worked as a Copywriter, advertising. She has no pets currently.  She does have indoor plants. Previously had mold in her home that was remediated. Carpet was removed.            Review of Systems  All other systems reviewed and are negative.      Objective:   Physical Exam Vitals signs reviewed.  Constitutional:      General: She is not in acute distress.    Appearance: Normal appearance. She is well-developed. She is not ill-appearing or toxic-appearing.  Cardiovascular:     Rate and Rhythm: Normal rate and regular rhythm.     Heart sounds: Normal heart sounds.  Pulmonary:     Effort: Pulmonary effort is normal. No respiratory distress.     Breath sounds: No stridor or decreased air movement. No wheezing or rales.    Chest:     Chest wall: Tenderness  present.  Musculoskeletal:       Feet:  Neurological:     Mental Status: She is alert.   .       Assessment & Plan:  Verruca vulgaris  Morton neuroma, right - Plan: Ambulatory referral to Podiatry  Rib pain  The wart has essentially healed.  I have recommended that the patient continue to apply salicylic acid to the punctate small ulcer until completely resolved.  I anticipate that this should occur over the next 1 week.  I believe that putting the padding between her great toe and second toe put pressure on the webspace between the second and third toes likely causing a Morton's neuroma.  She now has neuropathic pain in that area with no visible or palpable abnormality in the foot.  I have recommended a podiatry consultation for possible cortisone injection if they agree with my diagnosis.  I believe the patient strained an intercostal muscle on the left side.  There are no palpable abnormalities in her lungs are completely clear.  We will treat the patient for possible intercostal muscle strain with Norco 5/325 1 p.o. every 6 hours.  If pain intensifies, I would recommend a chest x-ray.  Patient already has a CT scheduled for next week and is comfortable waiting for the CT as long as  she does not get shortness of breath or worsening pain.

## 2018-10-22 ENCOUNTER — Telehealth: Payer: Self-pay | Admitting: Adult Health

## 2018-10-22 NOTE — Telephone Encounter (Signed)
Called and spoke to patient. Relayed message from Wyn Quaker, NP.  Patient verbalized understanding and thanked staff for the information. Nothing further needed at this time.   Routing to [employer], NP as FYI.

## 2018-10-22 NOTE — Telephone Encounter (Signed)
Technically yes this can affect a high-resolution CT chest as far as from an inflammatory standpoint.  Overall Ebony Scott ordered a high-resolution CT chest to evaluate for potential interstitial lung disease.  This would be a chronic finding.  Likely this interstitial lung disease could be being caused by her known rheumatoid arthritis.    I do not believe that the taper that she is currently taking indicates that we need to reschedule a high-resolution CT chest.  I would proceed forward as planned.  We will route to TP as FYI for her to see when she returns office tomorrow.  Aaron Edelman

## 2018-10-22 NOTE — Telephone Encounter (Signed)
Steroids are ok regarding HRCT chest  Cont w/ CT order

## 2018-10-22 NOTE — Telephone Encounter (Addendum)
Spoke with Ebony Scott. States that her Rheumatologist increased her prednisone to 30mg  for 3 days, 20mg  for 3 days then back to her normal 10mg  daily. Ebony Scott wants to make sure that this will not interfere with her HRCT that is to be done on 10/24/2018.  Aaron Edelman - please advise as Tammy is not in the office today. Thanks.

## 2018-10-24 ENCOUNTER — Ambulatory Visit (INDEPENDENT_AMBULATORY_CARE_PROVIDER_SITE_OTHER)
Admission: RE | Admit: 2018-10-24 | Discharge: 2018-10-24 | Disposition: A | Discharge status: Home / Self Care | Payer: Medicare Other | Source: Ambulatory Visit | Attending: Adult Health | Admitting: Adult Health

## 2018-10-24 ENCOUNTER — Other Ambulatory Visit: Payer: Self-pay

## 2018-10-24 DIAGNOSIS — R05 Cough: Secondary | ICD-10-CM | POA: Diagnosis not present

## 2018-10-24 DIAGNOSIS — R0602 Shortness of breath: Secondary | ICD-10-CM | POA: Diagnosis not present

## 2018-10-24 DIAGNOSIS — R059 Cough, unspecified: Secondary | ICD-10-CM

## 2018-10-28 ENCOUNTER — Other Ambulatory Visit: Payer: Self-pay | Admitting: Podiatry

## 2018-10-28 ENCOUNTER — Ambulatory Visit (INDEPENDENT_AMBULATORY_CARE_PROVIDER_SITE_OTHER): Payer: Medicare Other

## 2018-10-28 ENCOUNTER — Ambulatory Visit (INDEPENDENT_AMBULATORY_CARE_PROVIDER_SITE_OTHER): Payer: Medicare Other | Admitting: Podiatry

## 2018-10-28 ENCOUNTER — Other Ambulatory Visit: Payer: Self-pay

## 2018-10-28 DIAGNOSIS — L97511 Non-pressure chronic ulcer of other part of right foot limited to breakdown of skin: Secondary | ICD-10-CM

## 2018-10-28 DIAGNOSIS — L03031 Cellulitis of right toe: Secondary | ICD-10-CM | POA: Diagnosis not present

## 2018-10-28 DIAGNOSIS — M21619 Bunion of unspecified foot: Secondary | ICD-10-CM

## 2018-10-28 DIAGNOSIS — L02611 Cutaneous abscess of right foot: Secondary | ICD-10-CM

## 2018-10-28 DIAGNOSIS — G5761 Lesion of plantar nerve, right lower limb: Secondary | ICD-10-CM

## 2018-10-28 MED ORDER — MUPIROCIN 2 % EX OINT: 1.0000 "application " | TOPICAL_OINTMENT | Freq: Two times a day (BID) | CUTANEOUS | 2 refills | Status: DC

## 2018-10-28 MED ORDER — CLINDAMYCIN HCL 300 MG PO CAPS: 300.0000 mg | ORAL_CAPSULE | Freq: Three times a day (TID) | ORAL | 0 refills | Status: DC

## 2018-10-28 MED ORDER — FLUCONAZOLE 150 MG PO TABS: 150.0000 mg | ORAL_TABLET | Freq: Once | ORAL | 0 refills | Status: AC

## 2018-10-29 ENCOUNTER — Other Ambulatory Visit: Payer: Self-pay | Admitting: Family Medicine

## 2018-10-29 ENCOUNTER — Other Ambulatory Visit: Payer: Self-pay | Admitting: Internal Medicine

## 2018-10-29 NOTE — Telephone Encounter (Signed)
Refill x 2 and ask her to schedule a visit Last seen 3/208 colonoscopy

## 2018-10-29 NOTE — Telephone Encounter (Signed)
Ebony Scott has made an appointment for 11/12/2018 and she didn't mean for this medicine refill to come to [place]. It is supposed to go thru her PCP. She is going to call them and I told her I will refuse it on our end. She wants to come in and talk about her IBS and gas.

## 2018-10-29 NOTE — Telephone Encounter (Signed)
Please advise Sir? 

## 2018-10-30 ENCOUNTER — Telehealth: Payer: Self-pay | Admitting: *Deleted

## 2018-10-30 MED FILL — GERHARDT'S BUTT CREAM BULK: 20 days supply | Qty: 60 | Fill #5

## 2018-10-30 NOTE — Progress Notes (Signed)
Subjective:   Patient ID: Ebony Scott, female   DOB: 68 y.o.   MRN: [id number]   HPI 68 year old female presents the office today for concerns of a possible on the right second toe interdigital area, medial aspect.  She states this been on the last 3 months.  She has been applying salicylic acid over-the-counter medication.  She also states it has been frozen off previously.  She says that recently she has had some pain and she points to the toe and on the MTPJ area where she has been experiencing pain.  She denies any drainage or pus.  She has no other concerns today.   Review of Systems  All other systems reviewed and are negative.  Past Medical History:  Diagnosis Date  . Allergic rhinitis   . Allergy    SEASONAL  . Anemia   . Anxiety    pt denies  . Asthma   . Breast cancer ([place]) 1998   in remission  . Cataract    REMOVED  . Complication of anesthesia    "had hard time waking up from it several times" (02/20/2012)  . Depression    "some; don't take anything for it" (02/20/2012)  . Diverticulosis   . DVT (deep venous thrombosis) ([place])   . Exertional dyspnea   . Fibromyalgia 11/2011  . GERD (gastroesophageal reflux disease)   . Graves disease   . Headache(784.0)    "related to allergies; more at different times during the year" (02/20/2012)  . Hemorrhoids   . Hiatal hernia    back and neck  . Hx of adenomatous colonic polyps 04/12/2016  . Hypercholesteremia    good cholesterol is high  . Hypothyroidism   . IBS (irritable bowel syndrome)   . Moderate persistent asthma    -FeV1 72% 2011, -IgE 102 2011, CT sinus Neg 2011  . Osteoporosis    on reclast yearly  . Pneumonia 04/2011; ~ 11/2011   "double; single" (02/20/2012)  . Seronegative rheumatoid arthritis ([place])    Dr. Lahoma Rocker    Past Surgical History:  Procedure Laterality Date  . ANTERIOR AND POSTERIOR REPAIR  1990's  . APPENDECTOMY    . BREAST LUMPECTOMY  1998   left  . CARPOMETACARPEL ([place]) FUSION OF THUMB  WITH AUTOGRAFT FROM RADIUS  ~ 2009   "both thumbs" (02/20/2012)  . CATARACT EXTRACTION W/ INTRAOCULAR LENS  IMPLANT, BILATERAL  2012  . CERVICAL DISCECTOMY  10/2001   C5-C6  . CERVICAL FUSION  2003   C3-C4  . CHOLECYSTECTOMY    . COLONOSCOPY    . DEBRIDEMENT TENNIS ELBOW  ?1970's   right  . ESOPHAGOGASTRODUODENOSCOPY    . HYSTERECTOMY    . KNEE ARTHROPLASTY  ?1990's   "?right; w/cartilage repair" (02/20/2012)  . NASAL SEPTUM SURGERY  1980's  . POSTERIOR CERVICAL FUSION/FORAMINOTOMY  2004   "failed initial fusion; rewired  anterior neck" (02/20/2012)  . TONSILLECTOMY  ~ 1953  . VESICOVAGINAL FISTULA CLOSURE W/ TAH  1988  . VIDEO BRONCHOSCOPY Bilateral 08/23/2016   Procedure: VIDEO BRONCHOSCOPY WITH FLUORO;  Surgeon: Javier Glazier, MD;  Location: [facility];  Service: Cardiopulmonary;  Laterality: Bilateral;     Current Outpatient Medications:  .  Abatacept (ORENCIA [place]), Inject into the skin., Disp: , Rfl:  .  acetaminophen (TYLENOL) 650 MG CR tablet, Take 1,300 mg by mouth every 8 (eight) hours as needed for pain., Disp: , Rfl:  .  albuterol (PROAIR HFA) 108 (90 Base) MCG/ACT inhaler, Inhale 2  puffs into the lungs every 4 (four) hours as needed for wheezing or shortness of breath., Disp: 18 g, Rfl: 5 .  Alpha-D-Galactosidase (BEANO PO), Take 1 capsule by mouth 3 (three) times daily as needed (gas/ bloating). , Disp: , Rfl:  .  Alpha-Lipoic Acid 600 MG CAPS, Take 600 mg by mouth daily. , Disp: , Rfl:  .  alum & mag hydroxide-simeth (MAALOX/MYLANTA) 200-200-20 MG/5ML suspension, Take 15 mLs by mouth every 4 (four) hours as needed for indigestion or heartburn., Disp: 355 mL, Rfl: 0 .  atorvastatin (LIPITOR) 10 MG tablet, Take 1 tablet (10 mg total) by mouth daily., Disp: 90 tablet, Rfl: 3 .  azelastine (ASTELIN) 0.1 % nasal spray, USE 2 SPRAYS IN EACH NOSTRIL DAILY AS DIRECTED, Disp: 30 mL, Rfl: 5 .  CALCIUM-MAGNESIUM PO, Take 1 tablet by mouth at bedtime., Disp: , Rfl:  .  cetirizine  (ZYRTEC) 10 MG tablet, Take 5 mg by mouth every evening. , Disp: , Rfl:  .  cholecalciferol (VITAMIN D) 25 MCG (1000 UT) tablet, Take 1,000 Units by mouth daily. , Disp: , Rfl:  .  denosumab (PROLIA) 60 MG/ML SOLN injection, Inject 60 mg into the skin every 6 (six) months. Administer in upper arm, thigh, or abdomen, Disp: , Rfl:  .  DEXILANT 60 MG capsule, TAKE 1 CAPSULE BY MOUTH EACH MORNING, Disp: 30 capsule, Rfl: 5 .  diclofenac sodium (VOLTAREN) 1 % GEL, Apply 2 g topically 2 (two) times daily., Disp: 1 Tube, Rfl: 5 .  EPINEPHrine (EPIPEN 2-PAK) 0.3 mg/0.3 mL IJ SOAJ injection, Inject 0.3 mLs (0.3 mg total) into the muscle Once PRN for up to 1 dose., Disp: 1 Device, Rfl: 1 .  famotidine (PEPCID) 20 MG tablet, Take 1 tablet (20 mg total) by mouth 2 (two) times daily., Disp: 60 tablet, Rfl: 5 .  gabapentin (NEURONTIN) 600 MG tablet, TAKE 1 TABLET BY MOUTH DAILY, Disp: 90 tablet, Rfl: 3 .  HYDROcodone-acetaminophen (NORCO) 5-325 MG tablet, Take 1 tablet by mouth every 6 (six) hours as needed for moderate pain., Disp: 30 tablet, Rfl: 0 .  Hydrocortisone (GERHARDT'S BUTT CREAM) CREA, Apply 1 application topically 4 (four) times daily., Disp: 60 each, Rfl: 1 .  hydrocortisone (PROCTOCORT) 1 % CREA, Gerhardts butt cream - Use as directed, Disp: 60 g, Rfl: 1 .  hydrocortisone cream 0.5 %, Apply 1 application topically 4 (four) times daily. SEE PHARMACY NOTE, Disp: 60 g, Rfl: 5 .  Lactase (LACTAID FAST ACT) 9000 units TABS, Take 18,000 Units by mouth 4 (four) times daily as needed (dairy consumption). , Disp: , Rfl:  .  leucovorin (WELLCOVORIN) 5 MG tablet, , Disp: , Rfl:  .  lidocaine (LIDODERM) 5 %, Place 1 patch onto the skin daily as needed (pain). Remove & Discard patch within 12 hours or as directed by MD, Disp: , Rfl:  .  methotrexate 2.5 MG tablet, Take 15 mg by mouth once a week. , Disp: , Rfl:  .  mirtazapine (REMERON SOL-TAB) 30 MG disintegrating tablet, DISSOLVE 1 TABLET IN MOUTH EVERY NIGHT  AT BEDTIME, Disp: 30 tablet, Rfl: 2 .  mometasone-formoterol (DULERA) 200-5 MCG/ACT AERO, Inhale 2 puffs into the lungs 2 (two) times a day., Disp: 1 Inhaler, Rfl: 3 .  montelukast (SINGULAIR) 10 MG tablet, TAKE 1 CAPSULE BY MOUTH DAILY, Disp: 30 tablet, Rfl: 5 .  mupirocin ointment (BACTROBAN) 2 %, Place 1 application into the nose at bedtime. To prevent nose bleeds, Disp: 22 g, Rfl:  .  nystatin (MYCOSTATIN) 100000 UNIT/ML suspension, USE AS DIRECTED 5MLS IN MOUTH OR THROAT 4 TIMES DAILY, Disp: 473 mL, Rfl: 1 .  nystatin-triamcinolone ointment (MYCOLOG), , Disp: , Rfl:  .  predniSONE (DELTASONE) 5 MG tablet, 10 mg., Disp: , Rfl:  .  PRESCRIPTION MEDICATION, Place 2 puffs into both ears once a week. Place capsule in dispenser and instill 2 puffs in each ear once a week after washing hair. CS (X) AHCL    CHLORAMP/SULFU/AMPHO B/ H DROC 100-100-5 mg capsule - compounded at [place], Disp: , Rfl:  .  Propylene Glycol-Glycerin (SOOTHE OP), Place 1 drop into both eyes 2 (two) times daily as needed (dry eyes). , Disp: , Rfl:  .  Respiratory Therapy Supplies (FLUTTER) DEVI, Use as directed, Disp: 1 each, Rfl: 0 .  rivaroxaban (XARELTO) 20 MG TABS tablet, Take 1 tablet (20 mg total) by mouth daily with supper., Disp: 90 tablet, Rfl: 3 .  Spacer/Aero-Holding Chambers (AEROCHAMBER PLUS) inhaler, Use as instructed, Disp: 1 each, Rfl: 0 .  SPIRIVA RESPIMAT 2.5 MCG/ACT AERS, INHALE 2 PUFFS INTO THE LUNGS DAILY, Disp: 4 g, Rfl: 5 .  clindamycin (CLEOCIN) 300 MG capsule, Take 1 capsule (300 mg total) by mouth 3 (three) times daily., Disp: 21 capsule, Rfl: 0 .  cyproheptadine (PERIACTIN) 4 MG tablet, TAKE 2 TABLETS BY MOUTH EVERY EVENING, Disp: 60 tablet, Rfl: 5 .  mupirocin ointment (BACTROBAN) 2 %, Apply 1 application topically 2 (two) times daily., Disp: 30 g, Rfl: 2  Allergies  Allergen Reactions  . Dust Mite Extract Shortness Of Breath and Other (See Comments)    "sneezing" (02/20/2012)  . Molds & Smuts  Shortness Of Breath  . Morphine Sulfate Itching  . Other Shortness Of Breath and Other (See Comments)    Grass and weeds "sneezing; filled sinuses" (02/20/2012)  . Penicillins Rash and Other (See Comments)    "welts" (02/20/2012) Has patient had a PCN reaction causing immediate rash, facial/tongue/throat swelling, SOB or lightheadedness with hypotension: Unknown Has patient had a PCN reaction causing severe rash involving mucus membranes or skin necrosis: No Has patient had a PCN reaction that required hospitalization: No Has patient had a PCN reaction occurring within the last 10 years: No If all of the above answers are "NO", then may proceed with Cephalosporin use.   Marland Kitchen Reclast [Zoledronic Acid] Other (See Comments)    Fever, Put in hospital, dr said it was a reaction from a reaction   . Rofecoxib Swelling    Vioxx REACTION: feet swelling  . Shrimp Flavor Anaphylaxis    ALL SHELLFISH  . Tetracycline Hcl Nausea And Vomiting  . Xolair [Omalizumab] Other (See Comments)    Caused Blood clot  . Dilaudid [Hydromorphone Hcl] Itching  . Hydrocodone-Acetaminophen Nausea And Vomiting  . Levofloxacin Other (See Comments)    REACTION: GI upset  . Oxycodone Hcl Nausea And Vomiting  . Paroxetine Nausea And Vomiting    Paxil   . Celecoxib Swelling    Feet swelling  . Diltiazem Swelling  . Lactose Intolerance (Gi) Other (See Comments)    Bloating and gas  . Tree Extract Other (See Comments)    "tested and told I was allergic to it; never experienced a reaction to it" (02/20/2012)        Objective:  Physical Exam  General: AAO x3, NAD  Dermatological: On the medial aspect of the right second toe on the PIPJ is what appears to be a superficial granular wound.  Upon debridement some  amount of serosanguineous drainage was expressed.  There is no probing, undermining or tunneling.  There was some hyperkeratotic tissue on the periphery.  Upon debridement there is any evidence of verruca today  there is actually a small ulceration.  There is mild erythema to the toe but no edema.  There is no drainage or pus.  There is no fluctuation crepitation.  No malodor.  Vascular: Dorsalis Pedis artery and Posterior Tibial artery pedal pulses are 2/4 bilateral with immedate capillary fill time.  There is no pain with calf compression, swelling, warmth, erythema.   Neruologic: Grossly intact via light touch bilateral.Patellar and Achilles deep tendon reflexes 2+ bilateral. No Babinski or clonus noted bilateral.   Musculoskeletal: Bunion deformities present.Muscular strength 5/5 in all groups tested bilateral.  Gait: Unassisted, Nonantalgic.    Assessment:   Right second toe skin lesion, ulceration with localized erythema    Plan:  -Treatment options discussed including all alternatives, risks, and complications -X-rays were obtained and reviewed with the patient.  Bunion deformities present.  No evidence of acute fracture.  No evidence of osteomyelitis or soft tissue emphysema. -Sharply debrided the wound today utilizing a 312 with scalpel and complications, healthy tissue. -At this time point of hold off on using salicylic acid as it is because of small wound.  Recommend antibiotic ointment dressing changes daily.  Also prescribed clindamycin discussed watching for any side effects she has multiple allergies.  Dispensed offloading pads.  Monitor symptoms of worsening infection call the office know should any occur.  Trula Slade DPM

## 2018-10-30 NOTE — Telephone Encounter (Signed)
Patient is asking if Dr.kozlow can call in a prescription for mirtazapine to [facility].

## 2018-10-30 NOTE — Telephone Encounter (Signed)
Dr. Neldon Mc please advise. Did not see any mention of this medication in past office notes.

## 2018-10-31 IMAGING — CT CT ANGIO CHEST
2 of 6 series · 18 of 36 positions shown · IV contrast (ISOVUE 370)
Comparison: 11/03/2015 and 06/05/2012

CLINICAL DATA: Flu-like symptoms. Knees, nausea, diarrhea and
cough. Generalized body aches 1 week.

EXAM:
CT ANGIOGRAPHY CHEST WITH CONTRAST
TECHNIQUE: Multidetector CT imaging of the chest was performed using the
standard protocol during bolus administration of intravenous
contrast. Multiplanar CT image reconstructions and MIPs were
obtained to evaluate the vascular anatomy.
CONTRAST:  80 mL Isovue 370 IV

[Series 5: coronal mpr · coronal · 0.44mm/px · 1 of 135 slices shown]
[im 68/135  mediastinal]
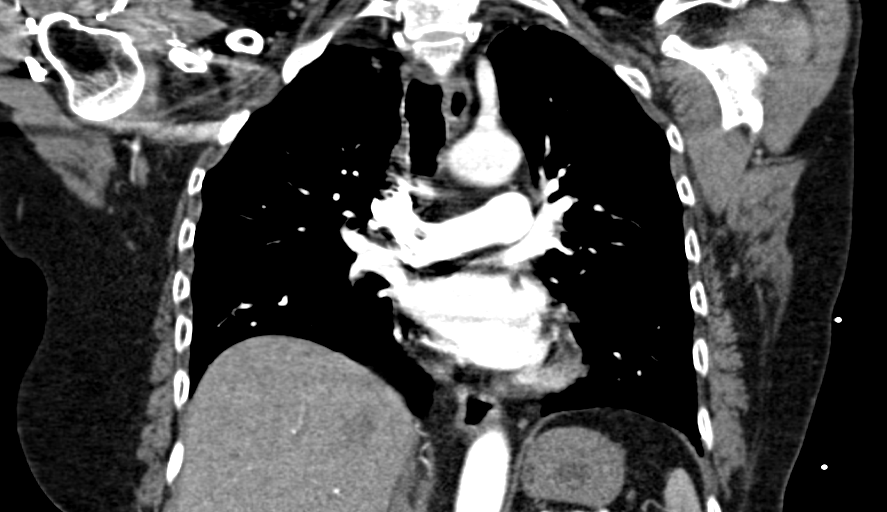

[Series 10: thins for pacs · axial · 0.74mm/px · z∈[+1248,+1449]mm · 17 of 225 slices shown]
[im 12/225  lung]
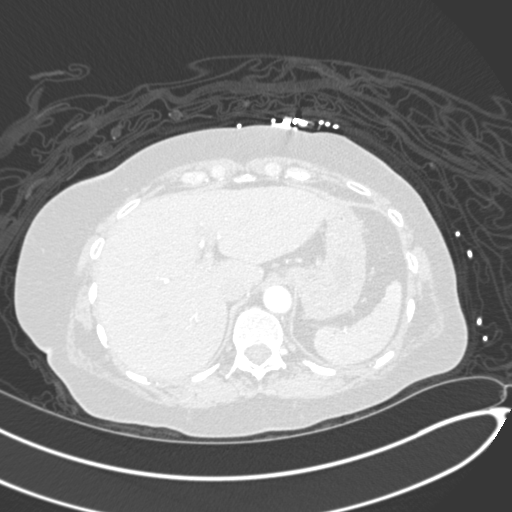
[im 23/225  mediastinal]
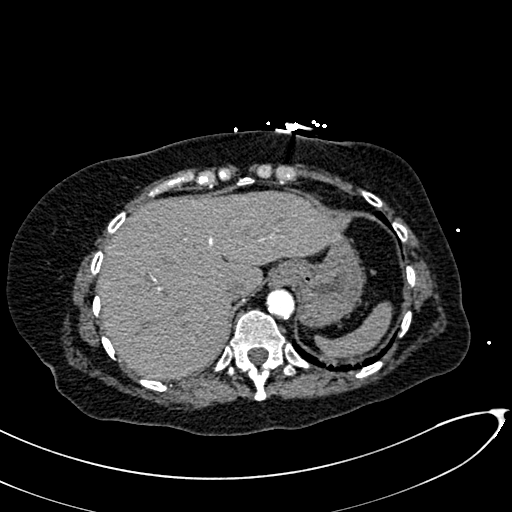
[im 34/225  lung]
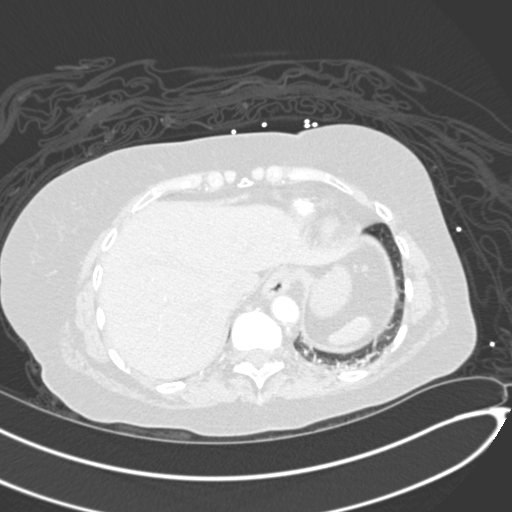
[im 45/225  mediastinal]
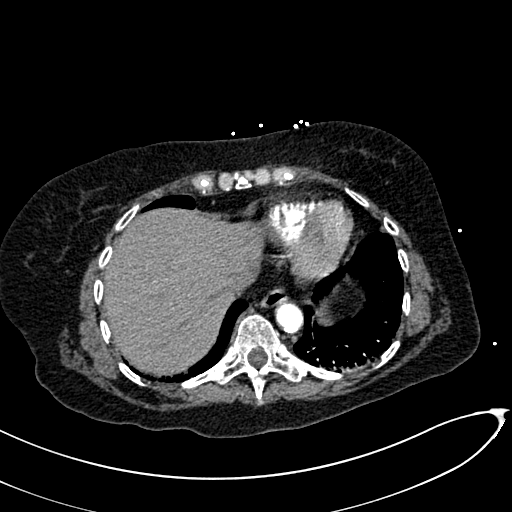
[im 68/225  lung]
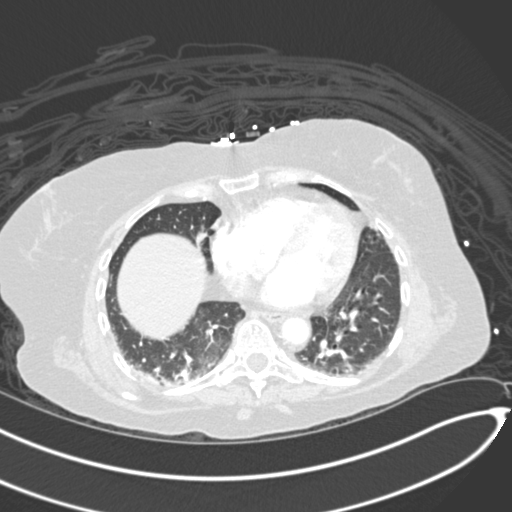
[im 79/225  mediastinal]
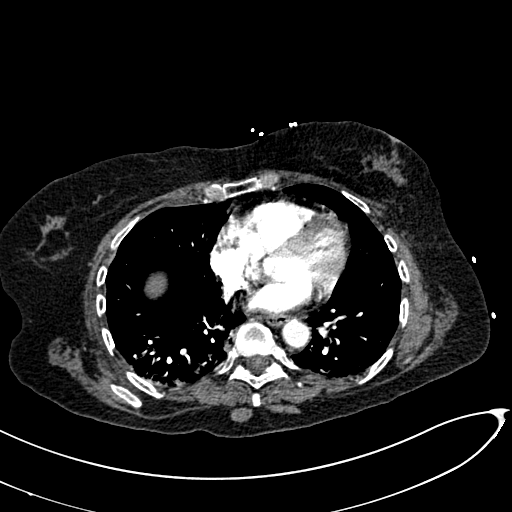
[im 90/225  lung]
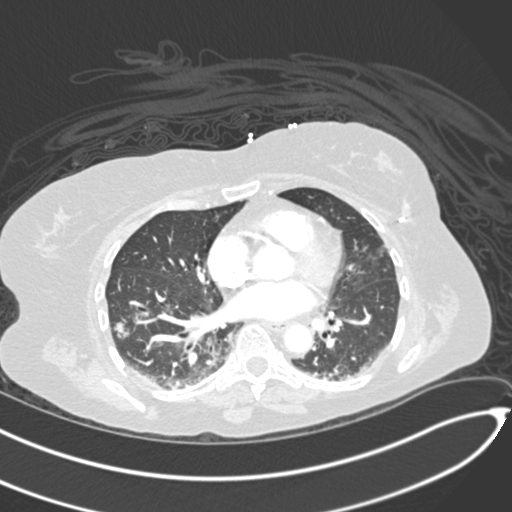
[im 101/225  mediastinal]
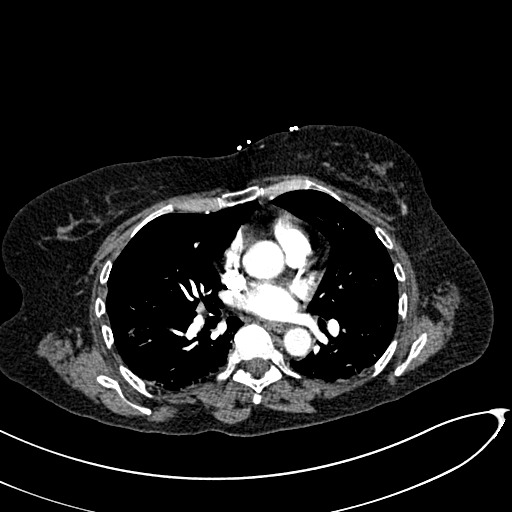
[im 113/225  lung]
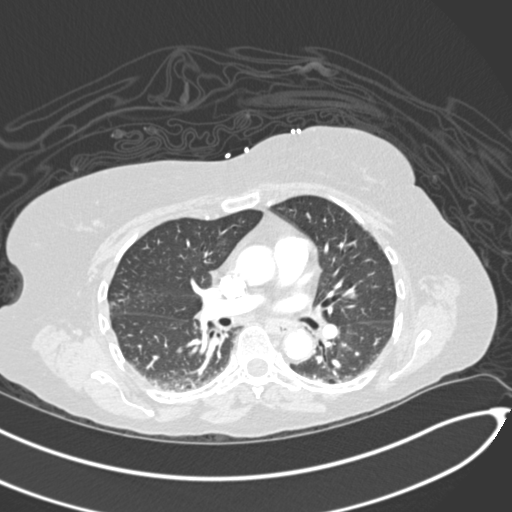
[im 124/225  mediastinal]
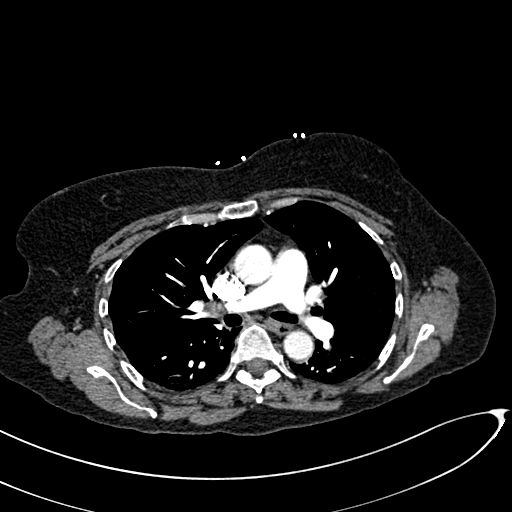
[im 135/225  lung]
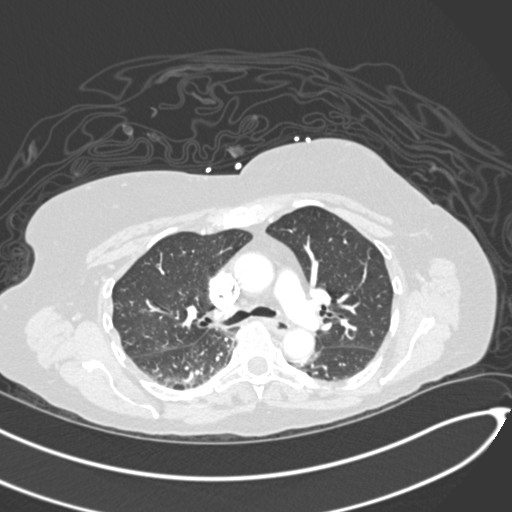
[im 146/225  mediastinal]
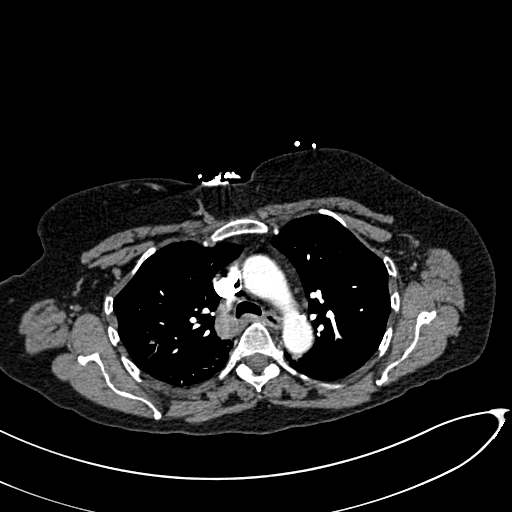
[im 157/225  lung]
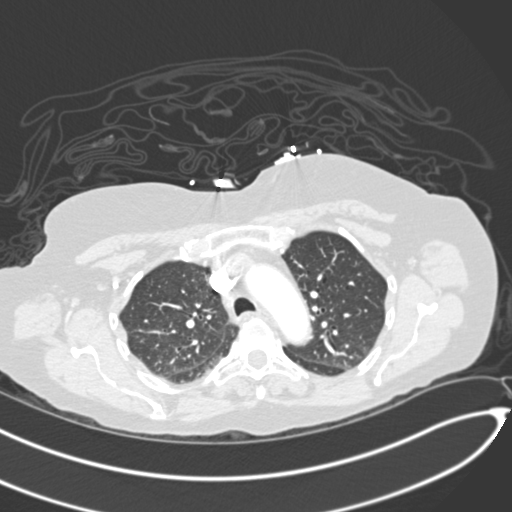
[im 180/225  mediastinal]
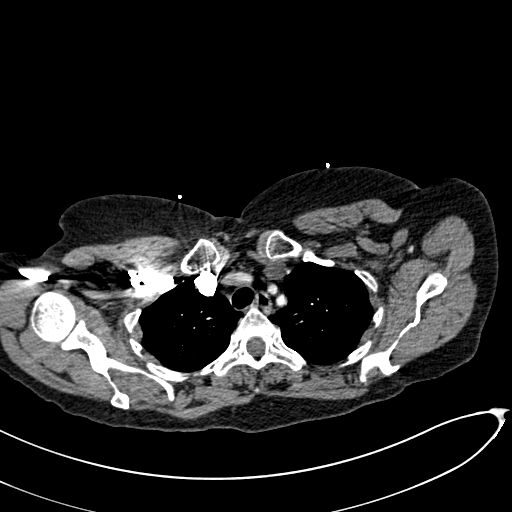
[im 191/225  lung]
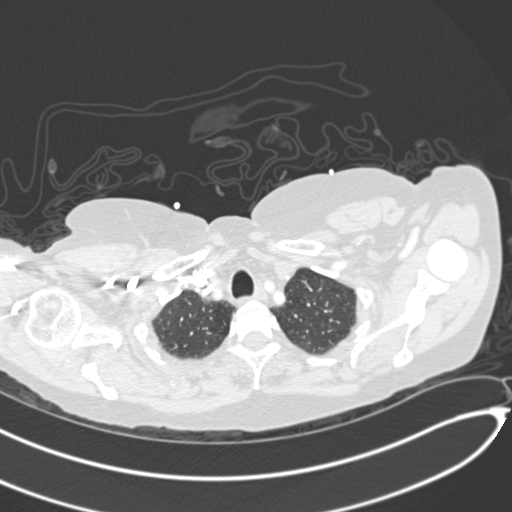
[im 202/225  mediastinal]
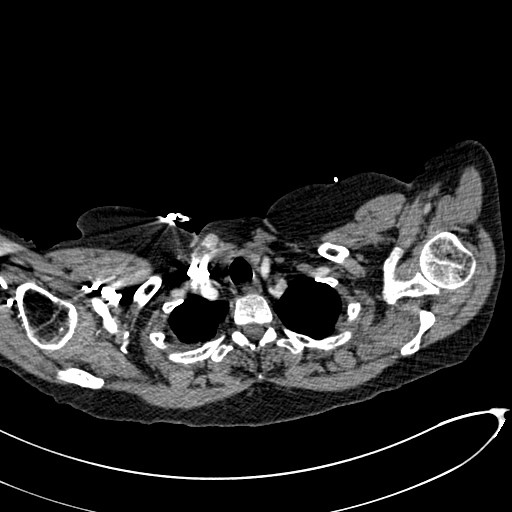
[im 213/225  lung]
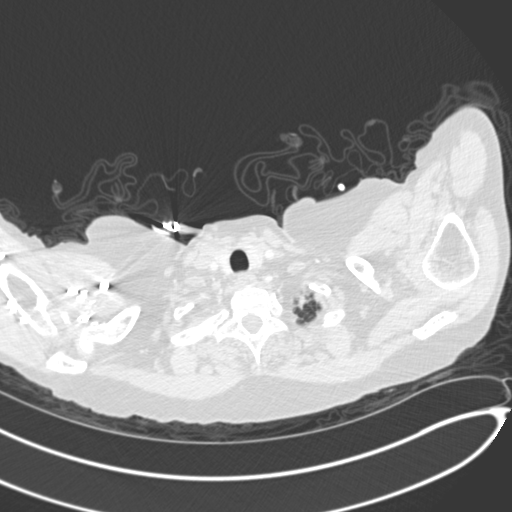

[18 of 36 positions shown; findings below may reference images not displayed]

FINDINGS: Cardiovascular: Heart is normal in size. Calcified plaque over the
left anterior descending coronary artery. Calcified plaque over the
thoracic aorta. No evidence of pulmonary embolism.

Mediastinum/Nodes: There is no evidence of hilar or mediastinal
adenopathy. Remaining mediastinal structures are within normal.

Lungs/Pleura: Lungs are adequately inflated and demonstrate patchy
airspace opacification over the right lower lobe likely pneumonia.
Minimal density over the medial right middle lobe likely
atelectasis. Also minimal bibasilar dependent atelectasis. No
evidence of pleural effusion. Airways are within normal.

Upper Abdomen: Calcified plaque over the abdominal aorta.

Musculoskeletal: Mild degenerate change of the spine.

Review of the MIP images confirms the above findings.
IMPRESSION: No evidence of pulmonary embolism.

Patchy airspace process over the right lower lobe likely a
pneumonia.

Atherosclerotic coronary artery disease.  Aortic atherosclerosis.

## 2018-11-01 IMAGING — DX DG KNEE COMPLETE 4+V*L*
5 series · 5 of 5 positions shown · non-contrast
Comparison: None.

CLINICAL DATA: Left knee pain without recent injury.

EXAM:
LEFT KNEE - COMPLETE 4+ VIEW

[knee ap]
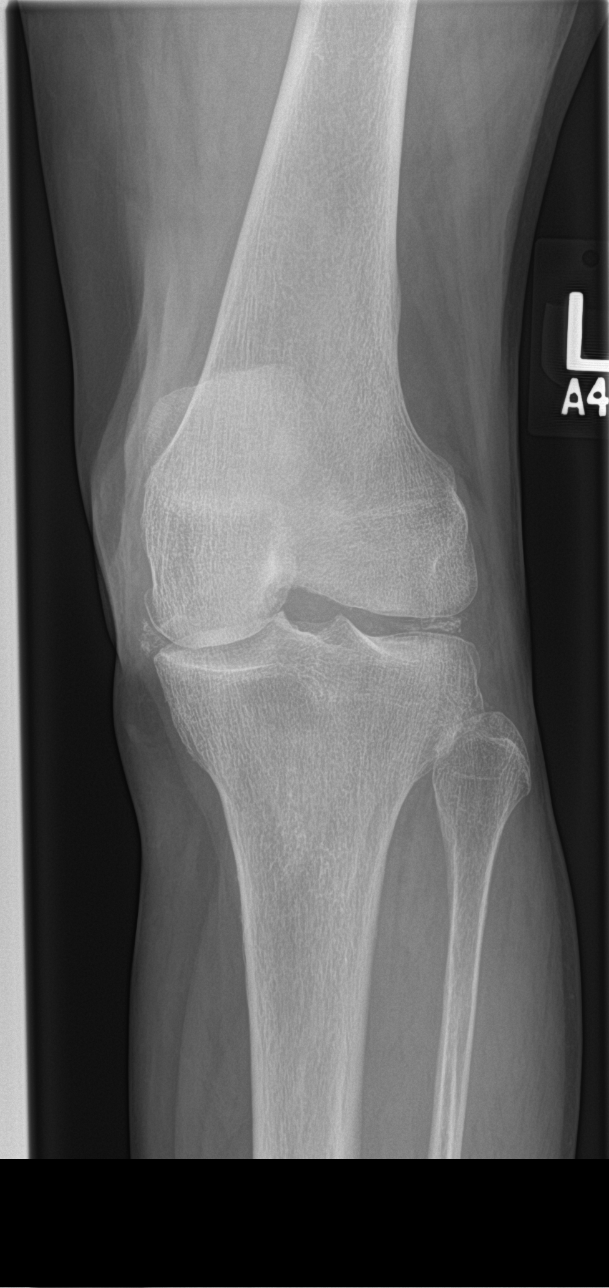

[knee obl (1 of 2)]
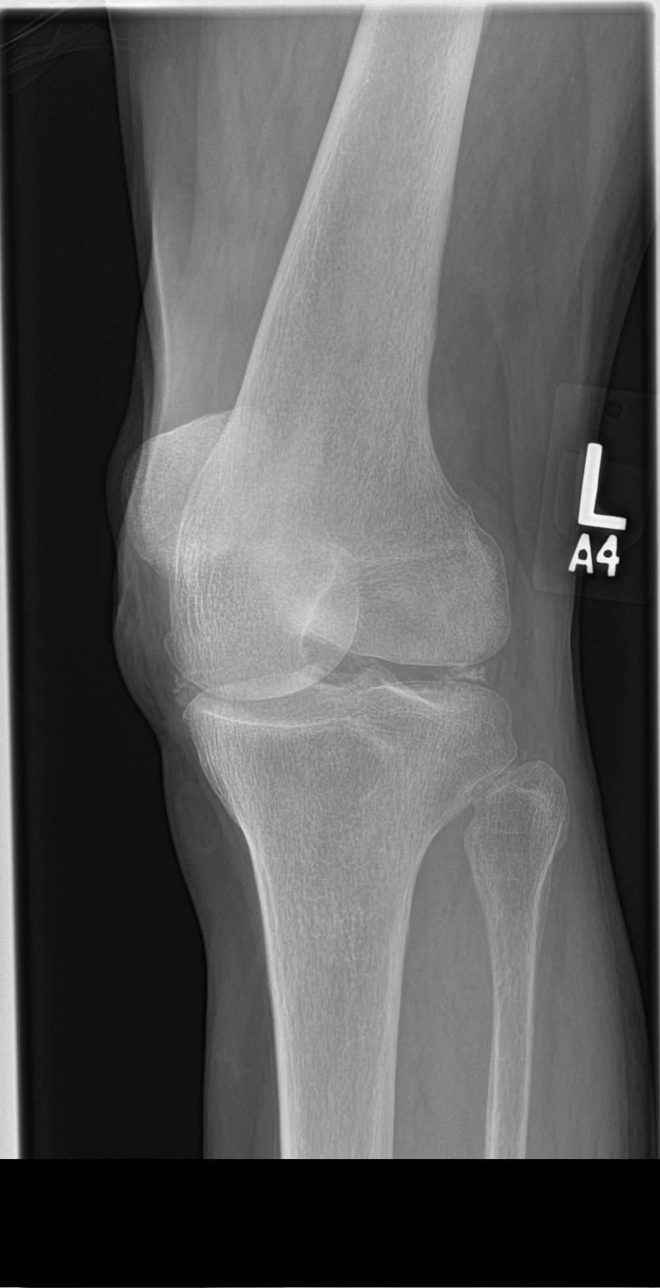

[knee obl (2 of 2)]
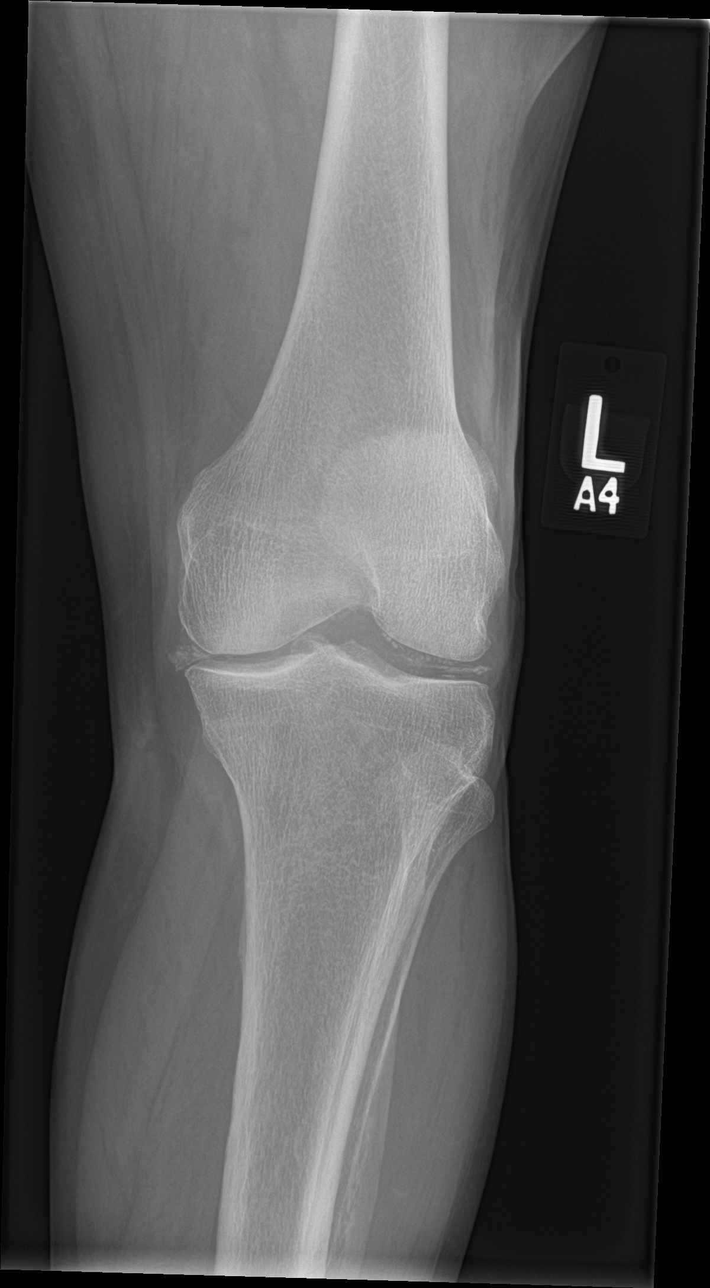

[knee lat (1 of 2)]
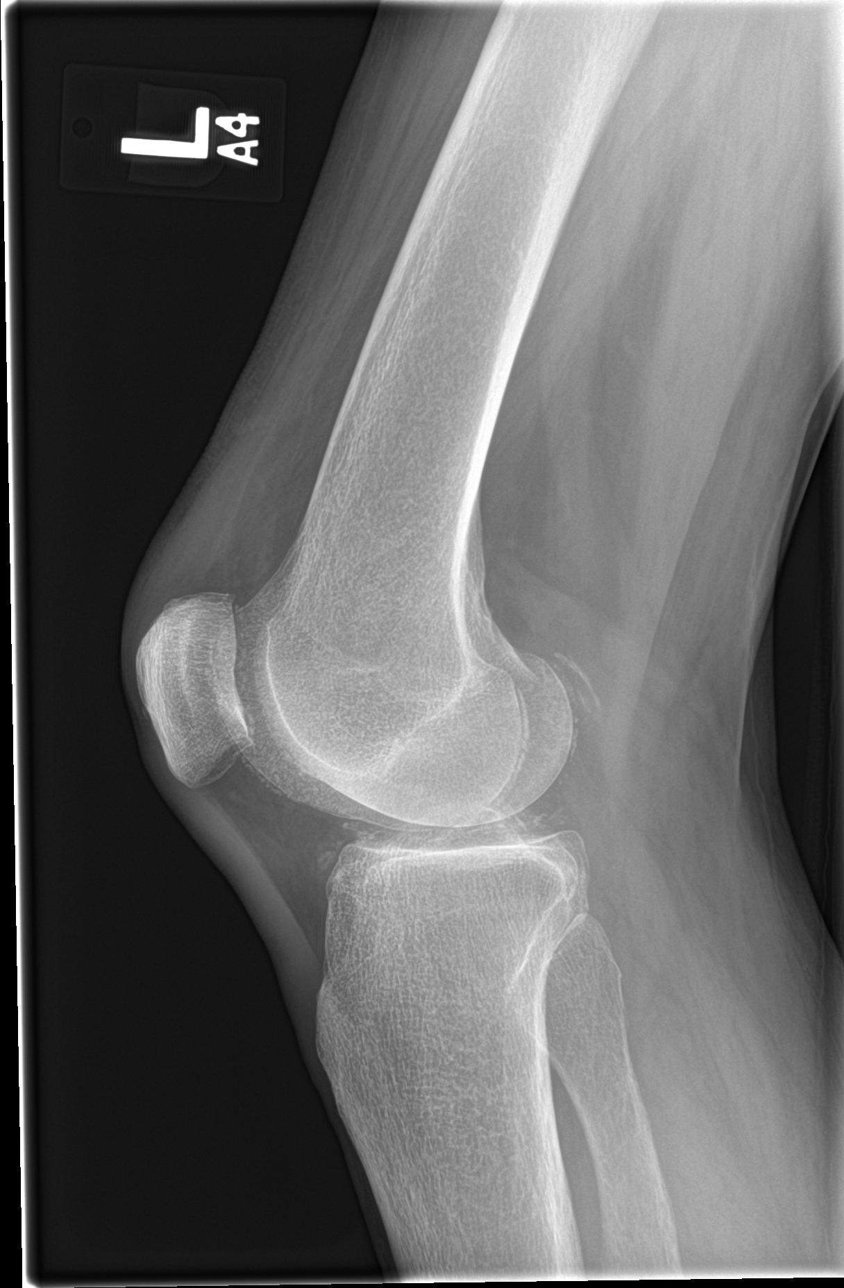

[knee lat (2 of 2)]
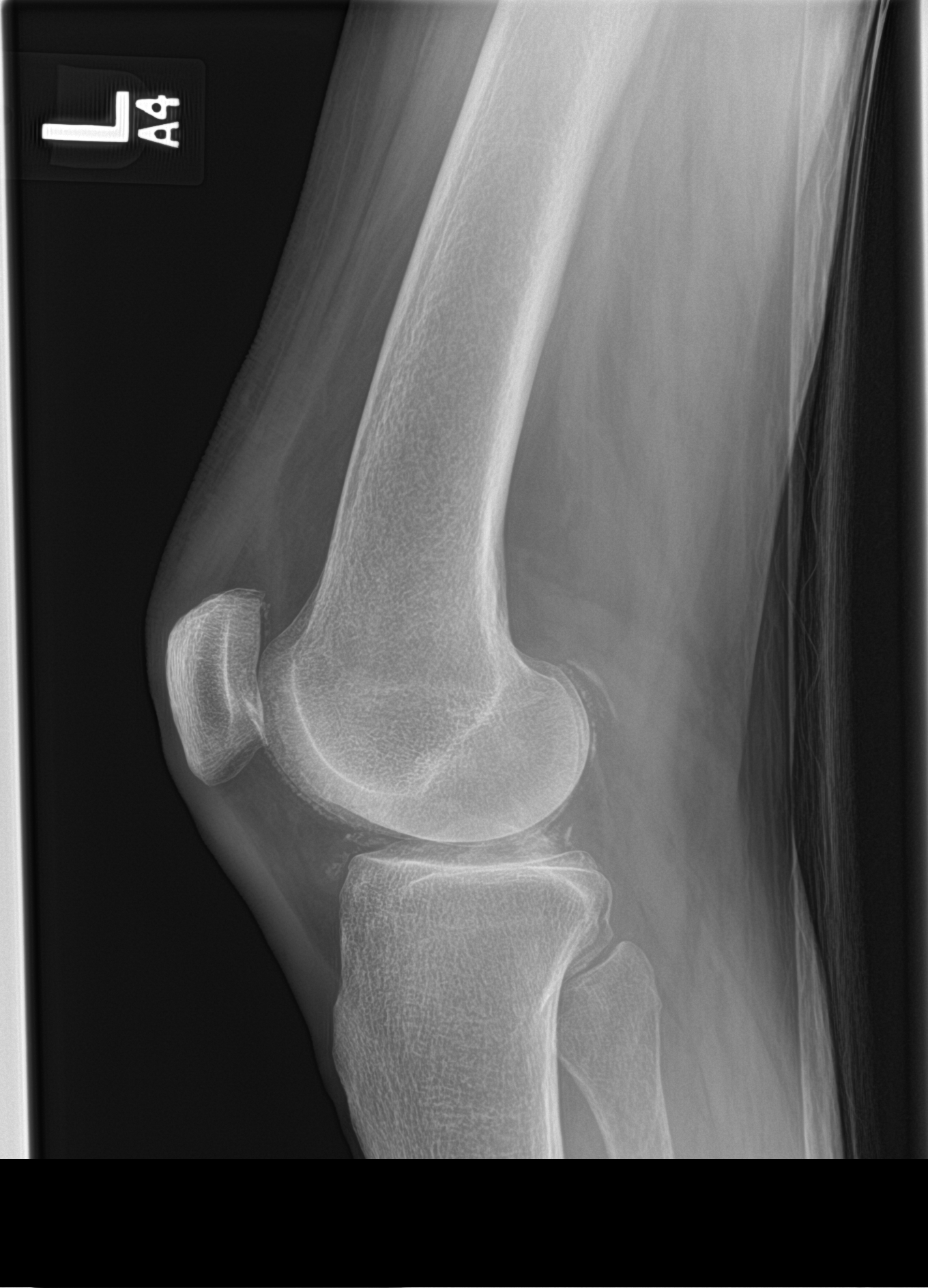

[5 of 5 positions shown; findings below may reference images not displayed]

FINDINGS: No evidence of fracture, dislocation, or joint effusion. Moderate
narrowing of medial joint space is noted. Chondrocalcinosis is noted
medially and laterally. Soft tissues are unremarkable.
IMPRESSION: Moderate degenerative joint disease. No acute abnormality seen in
the left knee.

## 2018-11-03 ENCOUNTER — Encounter: Payer: Self-pay | Admitting: Family Medicine

## 2018-11-03 ENCOUNTER — Encounter: Payer: Self-pay | Admitting: Adult Health

## 2018-11-03 ENCOUNTER — Ambulatory Visit (INDEPENDENT_AMBULATORY_CARE_PROVIDER_SITE_OTHER): Payer: Medicare Other | Admitting: Adult Health

## 2018-11-03 ENCOUNTER — Other Ambulatory Visit: Payer: Self-pay

## 2018-11-03 DIAGNOSIS — J3089 Other allergic rhinitis: Secondary | ICD-10-CM

## 2018-11-03 DIAGNOSIS — J4550 Severe persistent asthma, uncomplicated: Secondary | ICD-10-CM | POA: Diagnosis not present

## 2018-11-03 DIAGNOSIS — K219 Gastro-esophageal reflux disease without esophagitis: Secondary | ICD-10-CM

## 2018-11-03 DIAGNOSIS — J398 Other specified diseases of upper respiratory tract: Secondary | ICD-10-CM | POA: Insufficient documentation
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
## 2018-11-03 NOTE — Telephone Encounter (Signed)
Please inform patient that I have never prescribed this medication and do not really feel comfortable prescribing this medication.  She will need to follow-up with 1 of her other doctors to prescribe this medication.

## 2018-11-03 NOTE — Assessment & Plan Note (Signed)
Moderate to severe persistent asthma.  Recent pulmonary function testing showed no airflow obstruction with moderate restriction.  Suspect patient has a combination of issues including vocal cord dysfunction, tracheobronchialmalacia complicated by recurrent bronchitis, chronic rhinitis and GERD. She also has had significant weight gain from chronic steroid use.  For now we will maximize her current regimen for possible triggers including GERD chronic rhinitis.  Unclear what level of asthma she has.  For now will discontinue Spiriva.  If symptoms increase can always restart it back.  Plan  Patient Instructions  Continue on Dulera 100 2 puffs Twice daily  , brush , rinse and gargle after use, drink water .  May stop Spiriva 2 puffs daily. Rinse after use.  Continue on Dexilant daily Continue on Pepcid at bedtime Continue on Astelin nasal spray at bedtime As needed   Delsym 2 teaspoons twice daily as needed for cough Use flutter valve Twice daily  .  Continue on Singulair daily Continue on Zyrtec daily Work on healthy weight .  Follow up with Dr. Carlis Abbott  or Parrett in 3 months and As needed  Please contact office for sooner follow up if symptoms do not improve or worsen or seek emergency care

## 2018-11-03 NOTE — Telephone Encounter (Signed)
Informed patient that Dr. Neldon Mc is not comfortable prescribing the Mirtazapine and she needs to follow up with the physician that prescribed this or her PCP. Patient understood.

## 2018-11-03 NOTE — Assessment & Plan Note (Signed)
Continue on current regimen .   
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
## 2018-11-03 NOTE — Assessment & Plan Note (Addendum)
Control for triggers continue on current regimen  Plan  Patient Instructions  Continue on Dulera 100 2 puffs Twice daily  , brush , rinse and gargle after use, drink water .  May stop Spiriva 2 puffs daily. Rinse after use.  Continue on Dexilant daily Continue on Pepcid at bedtime Continue on Astelin nasal spray at bedtime As needed   Delsym 2 teaspoons twice daily as needed for cough Use flutter valve Twice daily  .  Continue on Singulair daily Continue on Zyrtec daily Work on healthy weight .  Follow up with Dr. Carlis Abbott  or Parrett in 3 months and As needed  Please contact office for sooner follow up if symptoms do not improve or worsen or seek emergency care      '

## 2018-11-03 NOTE — Patient Instructions (Addendum)
Continue on Dulera 100 2 puffs Twice daily  , brush , rinse and gargle after use, drink water .  May stop Spiriva 2 puffs daily. Rinse after use.  Continue on Dexilant daily Continue on Pepcid at bedtime Continue on Astelin nasal spray at bedtime As needed   Delsym 2 teaspoons twice daily as needed for cough Use flutter valve Twice daily  .  Continue on Singulair daily Continue on Zyrtec daily Work on healthy weight .  Follow up with Dr. Carlis Abbott  or Yvetta Drotar in 3 months and As needed  Please contact office for sooner follow up if symptoms do not improve or worsen or seek emergency care

## 2018-11-05 NOTE — Progress Notes (Signed)
Reviewed, agree 

## 2018-11-10 ENCOUNTER — Ambulatory Visit (INDEPENDENT_AMBULATORY_CARE_PROVIDER_SITE_OTHER): Payer: Medicare Other | Admitting: Podiatry

## 2018-11-10 ENCOUNTER — Telehealth: Payer: Self-pay | Admitting: Adult Health

## 2018-11-10 ENCOUNTER — Encounter: Payer: Self-pay | Admitting: Podiatry

## 2018-11-10 ENCOUNTER — Other Ambulatory Visit: Payer: Self-pay

## 2018-11-10 DIAGNOSIS — L84 Corns and callosities: Secondary | ICD-10-CM

## 2018-11-10 DIAGNOSIS — J398 Other specified diseases of upper respiratory tract: Secondary | ICD-10-CM

## 2018-11-10 DIAGNOSIS — L02611 Cutaneous abscess of right foot: Secondary | ICD-10-CM | POA: Diagnosis not present

## 2018-11-10 DIAGNOSIS — L97511 Non-pressure chronic ulcer of other part of right foot limited to breakdown of skin: Secondary | ICD-10-CM

## 2018-11-10 DIAGNOSIS — L03031 Cellulitis of right toe: Secondary | ICD-10-CM | POA: Diagnosis not present

## 2018-11-10 NOTE — Telephone Encounter (Signed)
Pt requesting a referral to [facility] for a second opinion on her tracheal bronchomalasia.   Spoke with TP, who is ok with this referral. Referral placed.  Nothing further needed at this time- will close encounter.

## 2018-11-10 NOTE — Progress Notes (Signed)
Subjective: 68 year old female presents the office today for follow-up evaluation left foot infection, wound to right second toe.  She states that overall she is doing much better since antibiotics.  She is also been keeping a piece of cotton between the first toe and second toe which is been helpful.  Denies any swelling, redness or drainage at this time.  Symptoms are have resolved for the most part she reports. Denies any systemic complaints such as fevers, chills, nausea, vomiting. No acute changes since last appointment, and no other complaints at this time.   Objective: AAO x3, NAD DP/PT pulses palpable bilaterally, CRT less than 3 seconds Minimal hyperkeratotic tissue present on the medial aspect of right second toe on the PIPJ.  No ulceration drainage or any signs of infection or verruca today.   No pain with calf compression, swelling, warmth, erythema  Assessment: Right second toe skin lesion likely result of pressure  Plan: -All treatment options discussed with the patient including all alternatives, risks, complications.  -No Venida Jarvis appears to be much improved.  Infection resolved.  Continue with small amounts of antibiotic ointment dressing changes daily for the next couple of days new offloading felt medically complex. -Patient encouraged to call the office with any questions, concerns, change in symptoms.   Return if symptoms worsen or fail to improve.  Trula Slade DPM

## 2018-11-12 ENCOUNTER — Encounter: Payer: Self-pay | Admitting: Internal Medicine

## 2018-11-12 ENCOUNTER — Other Ambulatory Visit: Payer: Self-pay

## 2018-11-12 ENCOUNTER — Ambulatory Visit (INDEPENDENT_AMBULATORY_CARE_PROVIDER_SITE_OTHER): Payer: Medicare Other | Admitting: Internal Medicine

## 2018-11-12 VITALS — BP 148/60 | HR 96 | Temp 98.0°F | Ht 61.0 in | Wt 149.0 lb

## 2018-11-12 DIAGNOSIS — G4700 Insomnia, unspecified: Secondary | ICD-10-CM | POA: Diagnosis not present

## 2018-11-12 DIAGNOSIS — K6389 Other specified diseases of intestine: Secondary | ICD-10-CM | POA: Diagnosis not present

## 2018-11-12 DIAGNOSIS — K589 Irritable bowel syndrome without diarrhea: Secondary | ICD-10-CM

## 2018-11-12 MED ORDER — RIFAXIMIN 550 MG PO TABS: 550.0000 mg | ORAL_TABLET | Freq: Three times a day (TID) | ORAL | 0 refills | Status: DC

## 2018-11-12 MED ORDER — MIRTAZAPINE 30 MG PO TBDP: ORAL_TABLET | ORAL | 3 refills | Status: DC

## 2018-11-12 NOTE — Assessment & Plan Note (Signed)
Retry Xifaxan

## 2018-11-12 NOTE — Patient Instructions (Signed)
We have sent the following medications to your pharmacy for you to pick up at your convenience:  We have given you samples of the following medication to take: Xifaxan 1 capsule three times daily x 14 days  If you are age 70 or older, your body mass index should be between 23-30. Your Body mass index is 28.15 kg/m. If this is out of the aforementioned range listed, please consider follow up with your Primary Care Provider.  If you are age 53 or younger, your body mass index should be between 19-25. Your Body mass index is 28.15 kg/m. If this is out of the aformentioned range listed, please consider follow up with your Primary Care Provider.

## 2018-11-12 NOTE — Progress Notes (Signed)
Ebony Scott 68 y.o. Dec 20, 1950 [id number]  Assessment & Plan:   Encounter Diagnoses  Name Primary?  . Irritable bowel syndrome without diarrhea Yes  . 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 Insomnia, unspecified type     Since she has responded to antibiotics in the past I am going to give her samples of Xifaxan for what I think is IBS/SIBO overlap syndrome.  I have refilled her mirtazapine which she has taken for a number of years that has helped sleep and nausea.  Routine follow-up in a year sooner if needed   I appreciate the opportunity to care for this patient. CC: Ebony Frizzle, MD  Subjective:   Chief Complaint: IBS, gas bloating  HPI Ebony Scott is here for follow-up, she has IBS she has had a lot of gas and bloating in the past that has responded to metronidazole and now she is having those problems again.  She has had a terrible time lately unfortunately where she had an admission to the hospital last year after being down for 3 days at home discovered by her daughter.  She ended up having a DVT and pulmonary emboli.  There was some question of whether or not that was related to sleeping medications.  And then she is also been diagnosed with seronegative rheumatoid arthritis.  On prednisone and methotrexate.  Also starting Orencia.  Now on chronic Xarelto also.  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  She has a referral to [facility] pending. Allergies  Allergen Reactions  . Dust Mite Extract Shortness Of Breath and Other (See Comments)    "sneezing" (02/20/2012)  . Molds & Smuts Shortness Of Breath  . Morphine Sulfate Itching  . Other Shortness Of Breath and Other (See Comments)    Grass and weeds "sneezing; filled sinuses" (02/20/2012)  . Penicillins Rash and Other (See Comments)    "welts" (02/20/2012) Has patient had a PCN reaction causing immediate rash, facial/tongue/throat swelling, SOB or lightheadedness  with hypotension: Unknown Has patient had a PCN reaction causing severe rash involving mucus membranes or skin necrosis: No Has patient had a PCN reaction that required hospitalization: No Has patient had a PCN reaction occurring within the last 10 years: No If all of the above answers are "NO", then may proceed with Cephalosporin use.   Marland Kitchen Reclast [Zoledronic Acid] Other (See Comments)    Fever, Put in hospital, dr said it was a reaction from a reaction   . Rofecoxib Swelling    Vioxx REACTION: feet swelling  . Shrimp Flavor Anaphylaxis    ALL SHELLFISH  . Tetracycline Hcl Nausea And Vomiting  . Xolair [Omalizumab] Other (See Comments)    Caused Blood clot  . Dilaudid [Hydromorphone Hcl] Itching  . Hydrocodone-Acetaminophen Nausea And Vomiting  . Levofloxacin Other (See Comments)    REACTION: GI upset  . Oxycodone Hcl Nausea And Vomiting  . Paroxetine Nausea And Vomiting    Paxil   . Celecoxib Swelling    Feet swelling  . Diltiazem Swelling  . Lactose Intolerance (Gi) Other (See Comments)    Bloating and gas  . Tree Extract Other (See Comments)    "tested and told I was allergic to it; never experienced a reaction to it" (02/20/2012)   Current Meds  Medication Sig  . Abatacept (ORENCIA [place]) Inject into the skin.  Marland Kitchen acetaminophen (TYLENOL) 650 MG CR tablet Take 1,300 mg by mouth every 8 (eight) hours as needed for  pain.  . albuterol (PROAIR HFA) 108 (90 Base) MCG/ACT inhaler Inhale 2 puffs into the lungs every 4 (four) hours as needed for wheezing or shortness of breath.  . Alpha-D-Galactosidase (BEANO PO) Take 1 capsule by mouth 3 (three) times daily as needed (gas/ bloating).   . Alpha-Lipoic Acid 600 MG CAPS Take 600 mg by mouth daily.   Marland Kitchen alum & mag hydroxide-simeth (MAALOX/MYLANTA) 200-200-20 MG/5ML suspension Take 15 mLs by mouth every 4 (four) hours as needed for indigestion or heartburn.  Marland Kitchen atorvastatin (LIPITOR) 10 MG tablet Take 1 tablet (10 mg total) by mouth daily.  Marland Kitchen  azelastine (ASTELIN) 0.1 % nasal spray USE 2 SPRAYS IN EACH NOSTRIL DAILY AS DIRECTED  . CALCIUM-MAGNESIUM PO Take 1 tablet by mouth at bedtime.  . cetirizine (ZYRTEC) 10 MG tablet Take 5 mg by mouth every evening.   . cholecalciferol (VITAMIN D) 25 MCG (1000 UT) tablet Take 1,000 Units by mouth daily.   . cyproheptadine (PERIACTIN) 4 MG tablet TAKE 2 TABLETS BY MOUTH EVERY EVENING  . denosumab (PROLIA) 60 MG/ML SOLN injection Inject 60 mg into the skin every 6 (six) months. Administer in upper arm, thigh, or abdomen  . DEXILANT 60 MG capsule TAKE 1 CAPSULE BY MOUTH EACH MORNING  . EPINEPHrine (EPIPEN 2-PAK) 0.3 mg/0.3 mL IJ SOAJ injection Inject 0.3 mLs (0.3 mg total) into the muscle Once PRN for up to 1 dose.  . famotidine (PEPCID) 20 MG tablet Take 1 tablet (20 mg total) by mouth 2 (two) times daily.  Marland Kitchen gabapentin (NEURONTIN) 600 MG tablet TAKE 1 TABLET BY MOUTH DAILY  . Hydrocortisone (GERHARDT'S BUTT CREAM) CREA Apply 1 application topically 4 (four) times daily.  . hydrocortisone cream 0.5 % Apply 1 application topically 4 (four) times daily. SEE PHARMACY NOTE  . Lactase (LACTAID FAST ACT) 9000 units TABS Take 18,000 Units by mouth 4 (four) times daily as needed (dairy consumption).   Marland Kitchen leucovorin (WELLCOVORIN) 5 MG tablet   . lidocaine (LIDODERM) 5 % Place 1 patch onto the skin daily as needed (pain). Remove & Discard patch within 12 hours or as directed by MD  . methotrexate 2.5 MG tablet Take 15 mg by mouth once a week.   . mirtazapine (REMERON SOL-TAB) 30 MG disintegrating tablet DISSOLVE 1 TABLET IN MOUTH EVERY NIGHT AT BEDTIME  . mometasone-formoterol (DULERA) 200-5 MCG/ACT AERO Inhale 2 puffs into the lungs 2 (two) times a day.  . montelukast (SINGULAIR) 10 MG tablet TAKE 1 CAPSULE BY MOUTH DAILY  . mupirocin ointment (BACTROBAN) 2 % Place 1 application into the nose at bedtime. To prevent nose bleeds  . nystatin (MYCOSTATIN) 100000 UNIT/ML suspension USE AS DIRECTED 5MLS IN MOUTH OR  THROAT 4 TIMES DAILY  . nystatin-triamcinolone ointment (MYCOLOG)   . predniSONE (DELTASONE) 5 MG tablet 10 mg.  . PRESCRIPTION MEDICATION Place 2 puffs into both ears once a week. Place capsule in dispenser and instill 2 puffs in each ear once a week after washing hair. CS (X) AHCL    CHLORAMP/SULFU/AMPHO B/ H DROC 100-100-5 mg capsule - compounded at [facility]  . Propylene Glycol-Glycerin (SOOTHE OP) Place 1 drop into both eyes 2 (two) times daily as needed (dry eyes).   Marland Kitchen Respiratory Therapy Supplies (FLUTTER) DEVI Use as directed  . rivaroxaban (XARELTO) 20 MG TABS tablet Take 1 tablet (20 mg total) by mouth daily with supper.  Marland Kitchen Spacer/Aero-Holding Chambers (AEROCHAMBER PLUS) inhaler Use as instructed  . SPIRIVA RESPIMAT 2.5 MCG/ACT AERS INHALE 2  PUFFS INTO THE LUNGS DAILY   Past Medical History:  Diagnosis Date  . Allergic rhinitis   . Allergy    SEASONAL  . Anemia   . Anxiety    pt denies  . Asthma   . Breast cancer ([place]) 1998   in remission  . Cataract    REMOVED  . Complication of anesthesia    "had hard time waking up from it several times" (02/20/2012)  . Depression    "some; don't take anything for it" (02/20/2012)  . Diverticulosis   . DVT (deep venous thrombosis) ([place])   . Exertional dyspnea   . Fibromyalgia 11/2011  . GERD (gastroesophageal reflux disease)   . Graves disease   . Headache(784.0)    "related to allergies; more at different times during the year" (02/20/2012)  . Hemorrhoids   . Hiatal hernia    back and neck  . Hx of adenomatous colonic polyps 04/12/2016  . Hypercholesteremia    good cholesterol is high  . Hypothyroidism   . IBS (irritable bowel syndrome)   . Moderate persistent asthma    -FeV1 72% 2011, -IgE 102 2011, CT sinus Neg 2011  . Osteoporosis    on reclast yearly  . Pneumonia 04/2011; ~ 11/2011   "double; single" (02/20/2012)  . Seronegative rheumatoid arthritis ([place])    Dr. Lahoma Rocker  . Tracheobronchomalacia    Past Surgical  History:  Procedure Laterality Date  . ANTERIOR AND POSTERIOR REPAIR  1990's  . APPENDECTOMY    . BREAST LUMPECTOMY  1998   left  . CARPOMETACARPEL ([place]) FUSION OF THUMB WITH AUTOGRAFT FROM RADIUS  ~ 2009   "both thumbs" (02/20/2012)  . CATARACT EXTRACTION W/ INTRAOCULAR LENS  IMPLANT, BILATERAL  2012  . CERVICAL DISCECTOMY  10/2001   C5-C6  . CERVICAL FUSION  2003   C3-C4  . CHOLECYSTECTOMY    . COLONOSCOPY    . DEBRIDEMENT TENNIS ELBOW  ?1970's   right  . ESOPHAGOGASTRODUODENOSCOPY    . HYSTERECTOMY    . KNEE ARTHROPLASTY  ?1990's   "?right; w/cartilage repair" (02/20/2012)  . NASAL SEPTUM SURGERY  1980's  . POSTERIOR CERVICAL FUSION/FORAMINOTOMY  2004   "failed initial fusion; rewired  anterior neck" (02/20/2012)  . TONSILLECTOMY  ~ 1953  . VESICOVAGINAL FISTULA CLOSURE W/ TAH  1988  . VIDEO BRONCHOSCOPY Bilateral 08/23/2016   Procedure: VIDEO BRONCHOSCOPY WITH FLUORO;  Surgeon: Javier Glazier, MD;  Location: [facility];  Service: Cardiopulmonary;  Laterality: Bilateral;   Social History   Social History Narrative   Patient lives at home alone. Patient  divorced.    Patient has her BS degree.   Right handed.   Caffeine- sometimes coffee.      [facility]:   Born in [place], [place]. She worked as a Copywriter, advertising. She has no pets currently. She does have indoor plants. Previously had mold in her home that was remediated. Carpet was removed.          family history includes Allergies in her father and mother; Arthritis in her father and mother; Colitis in her daughter; Colon cancer in an other family member; Diabetes in her maternal grandfather and mother; Heart disease in her father and mother; Lung cancer in her mother; Stroke in her father.   Review of Systems  As per HPI Objective:   Physical Exam  BP (!) 148/60   Pulse 96   Temp 98 F (36.7 C) (Oral)   Ht 5\' 1"  (1.549 m)  Wt 149 lb (67.6 kg)   BMI 28.15 kg/m  No acute distress but appearing  chronically ill Multiple areas of purpura on the extremities She is somewhat cushingoid now Alert and oriented x3 Appropriate mood and affect

## 2018-11-12 NOTE — Assessment & Plan Note (Signed)
Mirtazapine

## 2018-11-20 ENCOUNTER — Encounter: Payer: Self-pay | Admitting: Podiatry

## 2018-11-25 ENCOUNTER — Telehealth: Payer: Self-pay | Admitting: Podiatry

## 2018-11-25 ENCOUNTER — Encounter: Payer: Self-pay | Admitting: Family Medicine

## 2018-11-25 MED ORDER — HYDROCORTISONE 0.5 % EX CREA: 1.0000 "application " | TOPICAL_CREAM | Freq: Four times a day (QID) | CUTANEOUS | 5 refills | Status: DC

## 2018-11-25 MED ORDER — GERHARDT'S BUTT CREAM: 1.0000 "application " | TOPICAL_CREAM | Freq: Four times a day (QID) | CUTANEOUS | 3 refills | Status: DC

## 2018-11-25 NOTE — Telephone Encounter (Signed)
Pt received a call from the nurse on 10/1 to call and schedule an appt to be reevaluated for the callus on her right foot. Pt called today and scheduled an appt on 10/26 and would like to know if there is anything she can do in the meantime to help with the pain from the callus.  Please give patient a call.

## 2018-11-26 NOTE — Telephone Encounter (Signed)
Left message for pt to call for an earlier appt if the callous was open cover with an antibiotic ointment and call me today for more information.

## 2018-11-26 NOTE — Telephone Encounter (Signed)
Pt asked if she should begin the salicylic acid prescribed by her PCP on the toe, because the hard callous was coming back. I told pt to use a small amount of urea cream - AmLactin on the callous and them put her day-to-day cream around to keep skin pliable and to stop both if irritation occurs and not to use lotion between the toes. Pt states the callous is between toes, and I told pt to use a very little Urea cream on the callous and use a toe separator and stop urea cream if irritation occurred and I would put her on the cancellation schedule.

## 2018-11-26 NOTE — Telephone Encounter (Signed)
Has the callus opened up into a wound? Is there any redness, drainage or any signs of infection?

## 2018-12-01 MED FILL — GERHARDT'S BUTT CREAM BULK: 15 days supply | Qty: 60 | Fill #0

## 2018-12-02 ENCOUNTER — Encounter: Payer: Self-pay | Admitting: Family Medicine

## 2018-12-02 DIAGNOSIS — J398 Other specified diseases of upper respiratory tract: Secondary | ICD-10-CM

## 2018-12-04 ENCOUNTER — Other Ambulatory Visit: Payer: Self-pay | Admitting: Family Medicine

## 2018-12-04 ENCOUNTER — Other Ambulatory Visit: Payer: Self-pay | Admitting: Adult Health

## 2018-12-04 DIAGNOSIS — G4710 Hypersomnia, unspecified: Secondary | ICD-10-CM

## 2018-12-08 ENCOUNTER — Other Ambulatory Visit: Payer: Self-pay

## 2018-12-08 ENCOUNTER — Encounter: Payer: Self-pay | Admitting: Critical Care Medicine

## 2018-12-08 ENCOUNTER — Ambulatory Visit: Payer: Medicare Other | Admitting: Critical Care Medicine

## 2018-12-08 VITALS — BP 132/76 | HR 77 | Temp 97.7°F | Ht 61.0 in | Wt 149.6 lb

## 2018-12-08 DIAGNOSIS — J4550 Severe persistent asthma, uncomplicated: Secondary | ICD-10-CM

## 2018-12-08 DIAGNOSIS — J3089 Other allergic rhinitis: Secondary | ICD-10-CM | POA: Diagnosis not present

## 2018-12-08 DIAGNOSIS — J398 Other specified diseases of upper respiratory tract: Secondary | ICD-10-CM

## 2018-12-08 DIAGNOSIS — I2699 Other pulmonary embolism without acute cor pulmonale: Secondary | ICD-10-CM | POA: Diagnosis not present

## 2018-12-08 DIAGNOSIS — K219 Gastro-esophageal reflux disease without esophagitis: Secondary | ICD-10-CM

## 2018-12-08 DIAGNOSIS — J479 Bronchiectasis, uncomplicated: Secondary | ICD-10-CM

## 2018-12-08 NOTE — Patient Instructions (Signed)
Thank you for visiting Dr. Carlis Abbott at [facility]. We recommend the following:  Get a copy of disk for [facility] (call Radiology at [facility]).  Sleep study and bronchoscopy as ordered by [place].  Stay on current medications.    Return in about 2 months (around 02/07/2019).    Please do your part to reduce the spread of COVID-19.

## 2018-12-08 NOTE — Progress Notes (Signed)
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 Disp: 60 each, Rfl: 3   hydrocortisone cream 0.5 %, Apply 1 application topically 4 (four) times daily. SEE PHARMACY NOTE, Disp: 60 g, Rfl: 5   Lactase (LACTAID FAST ACT) 9000 units TABS, Take 18,000 Units by mouth 4 (four) times daily as needed (dairy consumption). , Disp: , Rfl:    leucovorin (WELLCOVORIN) 5 MG tablet, , Disp: , Rfl:    lidocaine (LIDODERM) 5 %, Place 1 patch onto the skin daily as needed (pain). Remove & Discard patch within 12 hours or as directed by MD, Disp: , Rfl:    methotrexate 2.5 MG tablet, Take 15 mg by mouth once a week. , Disp: , Rfl:    mirtazapine (REMERON SOL-TAB) 30 MG disintegrating tablet, DISSOLVE 1 TABLET IN MOUTH EVERY NIGHT AT BEDTIME, Disp: 90 tablet, Rfl: 3   montelukast  (SINGULAIR) 10 MG tablet, TAKE 1 CAPSULE BY MOUTH DAILY, Disp: 30 tablet, Rfl: 5   mupirocin ointment (BACTROBAN) 2 %, Place 1 application into the nose at bedtime. To prevent nose bleeds, Disp: 22 g, Rfl:    nystatin (MYCOSTATIN) 100000 UNIT/ML suspension, USE 5 MLS BY MOUTH AS DIRECTED IN MOUTH OR THROAT 4 TIMES DAILY, Disp: 473 mL, Rfl: 1   nystatin-triamcinolone ointment (MYCOLOG), , Disp: , Rfl:    predniSONE (DELTASONE) 5 MG tablet, 15 mg. , Disp: , Rfl:    PRESCRIPTION MEDICATION, Place 2 puffs into both ears once a week. Place capsule in dispenser and instill 2 puffs in each ear once a week after washing hair. CS (X) AHCL    CHLORAMP/SULFU/AMPHO B/ H  DROC 100-100-5 mg capsule - compounded at [place], Disp: , Rfl:    Propylene Glycol-Glycerin (SOOTHE OP), Place 1 drop into both eyes 2 (two) times daily as needed (dry eyes). , Disp: , Rfl:    Respiratory Therapy Supplies (FLUTTER) DEVI, Use as directed, Disp: 1 each, Rfl: 0   rifaximin (XIFAXAN) 550 MG TABS tablet, Take 1 tablet (550 mg total) by mouth 3 (three) times daily., Disp: 42 tablet, Rfl: 0   rivaroxaban (XARELTO) 20 MG TABS tablet, Take 1 tablet (20 mg total) by mouth daily with supper., Disp: 90 tablet, Rfl: 3   Spacer/Aero-Holding Chambers (AEROCHAMBER PLUS) inhaler, Use as instructed, Disp: 1 each, Rfl: 0   Julian Hy, DO [facility] 12/08/2018 6:40 PM

## 2018-12-10 ENCOUNTER — Ambulatory Visit (INDEPENDENT_AMBULATORY_CARE_PROVIDER_SITE_OTHER): Payer: Medicare Other | Admitting: Neurology

## 2018-12-10 ENCOUNTER — Encounter: Payer: Self-pay | Admitting: Neurology

## 2018-12-10 ENCOUNTER — Other Ambulatory Visit: Payer: Self-pay

## 2018-12-10 ENCOUNTER — Encounter: Payer: Self-pay | Admitting: Family Medicine

## 2018-12-10 VITALS — Temp 97.7°F | Ht 61.0 in | Wt 148.0 lb

## 2018-12-10 DIAGNOSIS — J398 Other specified diseases of upper respiratory tract: Secondary | ICD-10-CM

## 2018-12-10 DIAGNOSIS — R0683 Snoring: Secondary | ICD-10-CM | POA: Diagnosis not present

## 2018-12-10 DIAGNOSIS — E663 Overweight: Secondary | ICD-10-CM

## 2018-12-10 DIAGNOSIS — J4550 Severe persistent asthma, uncomplicated: Secondary | ICD-10-CM

## 2018-12-10 DIAGNOSIS — R351 Nocturia: Secondary | ICD-10-CM

## 2018-12-10 NOTE — Patient Instructions (Signed)
It was nice to meet you today.  Given your underlying diagnosis of tracheobronchomalacia and significant asthma, I would favor that your sleep evaluation and sleep study testing and management for potential sleep disordered breathing be through pulmonary sleep specialty.  Please talk to your primary care physician, Dr. Dennard Schaumann about the possibility of seeing a sleep specialist through the pulmonary department.  Otherwise, if you would like to pursue a sleep study testing through our office, I would be happy to order a sleep study, I have not yet ordered a sleep study. I will wait to hear back from you. Good luck with your upcoming bronchoscopy procedure at [facility].

## 2018-12-10 NOTE — Progress Notes (Signed)
Subjective:    Patient ID: Ebony Scott is a 68 y.o. female.  HPI     Star Age, MD, PhD [facility] [address], [address] P.O. [place], [place] [address]  Dear Dr. Dennard Schaumann, I saw your patient, Ebony Scott, upon your kind request to my sleep clinic today for initial consultation of her sleep disorder, in particular, concern for underlying obstructive sleep apnea.  The patient is unaccompanied today.  As you know, [name] is a 68 year old right-handed woman with an underlying medical history of arthritis (seronegative RA), irritable bowel syndrome, hyperlipidemia, hypothyroidism, history of Graves' disease, reflux disease, history of DVT, diverticulosis, depression, breast cancer, anxiety, asthma, anemia, allergic rhinitis, osteoporosis, tracheobronchomalacia and overweight state, who reports that she was diagnosed with asthma about 7 years ago.  She is in the process of being evaluated for her tracheobronchomalacia, she sees a specialist at [facility] and is scheduled for bronchoscopy in early November.  She had a CT scan recently.  She is followed by [facility].  She was on prednisone but no longer is taking prednisone.  She is currently on a hiatus of her methotrexate because of lower extremity swelling.  She sees rheumatology.  She is not sure that she snores significantly, college friend has recently mentioned that she made a snorting sound, her daughter has reported that she snores sometimes.  The patient goes to bed currently late, around 11 PM and as late as 2 AM, rise time is around 7.  She has nocturia about 2-3 times per average night, denies morning headaches or significant daytime somnolence, Epworth sleepiness score is 5 out of 24, fatigue severity score is 41 out of 63.  She is divorced and lives alone, she has 1 grown son and 1 daughter and 1 stepson that she raised.  She had gained weight when she was on prednisone.  She drinks caffeine in the form of  coffee, 1 cup/day and no alcohol currently.  She is not on oxygen, she is trying to lose weight.  She has no pets in the household.  Often she dozes off on the couch before she actually goes to bed.  She had a tonsillectomy as a preschooler and has no family history of obstructive sleep apnea.  Her Past Medical History Is Significant For: Past Medical History:  Diagnosis Date  . Acute deep vein thrombosis (DVT) of right lower extremity ([place]) 12/13/2017  . Allergic rhinitis   . Allergy    SEASONAL  . Anemia   . Anxiety    pt denies  . Asthma   . Breast cancer ([place]) 1998   in remission  . Cataract    REMOVED  . Clostridium difficile colitis 01/14/2018  . Clostridium difficile diarrhea 01/14/2018  . Complication of anesthesia    "had hard time waking up from it several times" (02/20/2012)  . Depression    "some; don't take anything for it" (02/20/2012)  . Diverticulosis   . DVT (deep venous thrombosis) ([place])   . Exertional dyspnea   . Fibromyalgia 11/2011  . GERD (gastroesophageal reflux disease)   . Graves disease   . Headache(784.0)    "related to allergies; more at different times during the year" (02/20/2012)  . Hemorrhoids   . Hiatal hernia    back and neck  . Hx of adenomatous colonic polyps 04/12/2016  . Hypercholesteremia    good cholesterol is high  . Hypothyroidism   . IBS (irritable bowel syndrome)   .  Moderate persistent asthma    -FeV1 72% 2011, -IgE 102 2011, CT sinus Neg 2011  . Osteoporosis    on reclast yearly  . Pneumonia 04/2011; ~ 11/2011   "double; single" (02/20/2012)  . Seronegative rheumatoid arthritis ([place])    Dr. Lahoma Rocker  . SIRS (systemic inflammatory response syndrome) ([place]) 02/10/2018  . Tracheobronchomalacia     Her Past Surgical History Is Significant For: Past Surgical History:  Procedure Laterality Date  . ANTERIOR AND POSTERIOR REPAIR  1990's  . APPENDECTOMY    . BREAST LUMPECTOMY  1998   left  . CARPOMETACARPEL ([place]) FUSION OF THUMB  WITH AUTOGRAFT FROM RADIUS  ~ 2009   "both thumbs" (02/20/2012)  . CATARACT EXTRACTION W/ INTRAOCULAR LENS  IMPLANT, BILATERAL  2012  . CERVICAL DISCECTOMY  10/2001   C5-C6  . CERVICAL FUSION  2003   C3-C4  . CHOLECYSTECTOMY    . COLONOSCOPY    . DEBRIDEMENT TENNIS ELBOW  ?1970's   right  . ESOPHAGOGASTRODUODENOSCOPY    . HYSTERECTOMY    . KNEE ARTHROPLASTY  ?1990's   "?right; w/cartilage repair" (02/20/2012)  . NASAL SEPTUM SURGERY  1980's  . POSTERIOR CERVICAL FUSION/FORAMINOTOMY  2004   "failed initial fusion; rewired  anterior neck" (02/20/2012)  . TONSILLECTOMY  ~ 1953  . VESICOVAGINAL FISTULA CLOSURE W/ TAH  1988  . VIDEO BRONCHOSCOPY Bilateral 08/23/2016   Procedure: VIDEO BRONCHOSCOPY WITH FLUORO;  Surgeon: Javier Glazier, MD;  Location: [facility];  Service: Cardiopulmonary;  Laterality: Bilateral;    Her Family History Is Significant For: Family History  Problem Relation Age of Onset  . Allergies Mother   . Heart disease Mother   . Arthritis Mother   . Lung cancer Mother   . Diabetes Mother   . Allergies Father   . Heart disease Father   . Arthritis Father   . Stroke Father   . Colon cancer Other        Maternal half aunt/Maternal half uncle  . Colitis Daughter   . Diabetes Maternal Grandfather     Her Social History Is Significant For: Social History   Socioeconomic History  . Marital status: Divorced    Spouse name: Not on file  . Number of children: 2  . Years of education: college  . Highest education level: Not on file  Occupational History  . Occupation: Disabled    Comment: Retired Engineer, production: RETIRED  Social Needs  . Financial resource strain: Not on file  . Food insecurity    Worry: Not on file    Inability: Not on file  . Transportation needs    Medical: Not on file    Non-medical: Not on file  Tobacco Use  . Smoking status: Passive Smoke Exposure - Never Smoker  . Smokeless tobacco: Never Used  .  Tobacco comment: Parents  Substance and Sexual Activity  . Alcohol use: Not Currently    Alcohol/week: 0.0 standard drinks  . Drug use: No  . Sexual activity: Never  Lifestyle  . Physical activity    Days per week: Not on file    Minutes per session: Not on file  . Stress: Not on file  Relationships  . Social Herbalist on phone: Not on file    Gets together: Not on file    Attends religious service: Not on file    Active member of club or organization: Not on file    Attends  meetings of clubs or organizations: Not on file    Relationship status: Not on file  Other Topics Concern  . Not on file  Social History Narrative   Patient lives at home alone. Patient  divorced.    Patient has her BS degree.   Right handed.   Caffeine- sometimes coffee.      [facility]:   Born in [place], [place]. She worked as a Copywriter, advertising. She has no pets currently. She does have indoor plants. Previously had mold in her home that was remediated. Carpet was removed.           Her Allergies Are:  Allergies  Allergen Reactions  . Dust Mite Extract Shortness Of Breath and Other (See Comments)    "sneezing" (02/20/2012)  . Molds & Smuts Shortness Of Breath  . Morphine Sulfate Itching  . Other Shortness Of Breath and Other (See Comments)    Grass and weeds "sneezing; filled sinuses" (02/20/2012)  . Penicillins Rash and Other (See Comments)    "welts" (02/20/2012) Has patient had a PCN reaction causing immediate rash, facial/tongue/throat swelling, SOB or lightheadedness with hypotension: Unknown Has patient had a PCN reaction causing severe rash involving mucus membranes or skin necrosis: No Has patient had a PCN reaction that required hospitalization: No Has patient had a PCN reaction occurring within the last 10 years: No If all of the above answers are "NO", then may proceed with Cephalosporin use.   Marland Kitchen Reclast [Zoledronic Acid] Other (See Comments)    Fever, Put in hospital, dr  said it was a reaction from a reaction   . Rofecoxib Swelling    Vioxx REACTION: feet swelling  . Shrimp Flavor Anaphylaxis    ALL SHELLFISH  . Tetracycline Hcl Nausea And Vomiting  . Xolair [Omalizumab] Other (See Comments)    Caused Blood clot  . Dilaudid [Hydromorphone Hcl] Itching  . Hydrocodone-Acetaminophen Nausea And Vomiting  . Levofloxacin Other (See Comments)    REACTION: GI upset  . Oxycodone Hcl Nausea And Vomiting  . Paroxetine Nausea And Vomiting    Paxil   . Celecoxib Swelling    Feet swelling  . Diltiazem Swelling  . Lactose Intolerance (Gi) Other (See Comments)    Bloating and gas  . Tree Extract Other (See Comments)    "tested and told I was allergic to it; never experienced a reaction to it" (02/20/2012)  :   Her Current Medications Are:  Outpatient Encounter Medications as of 12/10/2018  Medication Sig  . Abatacept (ORENCIA [place]) Inject into the skin.  Marland Kitchen acetaminophen (TYLENOL) 650 MG CR tablet Take 1,300 mg by mouth every 8 (eight) hours as needed for pain.  Marland Kitchen albuterol (PROAIR HFA) 108 (90 Base) MCG/ACT inhaler Inhale 2 puffs into the lungs every 4 (four) hours as needed for wheezing or shortness of breath.  . Alpha-D-Galactosidase (BEANO PO) Take 1 capsule by mouth 3 (three) times daily as needed (gas/ bloating).   . Alpha-Lipoic Acid 600 MG CAPS Take 600 mg by mouth daily.   Marland Kitchen alum & mag hydroxide-simeth (MAALOX/MYLANTA) 200-200-20 MG/5ML suspension Take 15 mLs by mouth every 4 (four) hours as needed for indigestion or heartburn.  Marland Kitchen atorvastatin (LIPITOR) 10 MG tablet Take 1 tablet (10 mg total) by mouth daily.  Marland Kitchen azelastine (ASTELIN) 0.1 % nasal spray USE 2 SPRAYS IN EACH NOSTRIL DAILY AS DIRECTED  . CALCIUM-MAGNESIUM PO Take 1 tablet by mouth at bedtime.  . cetirizine (ZYRTEC) 10 MG tablet Take 5 mg by mouth  every evening.   . cholecalciferol (VITAMIN D) 25 MCG (1000 UT) tablet Take 1,000 Units by mouth daily.   . cyproheptadine (PERIACTIN) 4 MG tablet  TAKE 2 TABLETS BY MOUTH EVERY EVENING  . denosumab (PROLIA) 60 MG/ML SOLN injection Inject 60 mg into the skin every 6 (six) months. Administer in upper arm, thigh, or abdomen  . DEXILANT 60 MG capsule TAKE 1 CAPSULE BY MOUTH EACH MORNING  . DULERA 200-5 MCG/ACT AERO INHALE 2 PUFFS INTO THE LUNGS TWICE DAILY  . EPINEPHrine (EPIPEN 2-PAK) 0.3 mg/0.3 mL IJ SOAJ injection Inject 0.3 mLs (0.3 mg total) into the muscle Once PRN for up to 1 dose.  . famotidine (PEPCID) 20 MG tablet Take 1 tablet (20 mg total) by mouth 2 (two) times daily.  Marland Kitchen gabapentin (NEURONTIN) 600 MG tablet TAKE 1 TABLET BY MOUTH DAILY  . Hydrocortisone (GERHARDT'S BUTT CREAM) CREA Apply 1 application topically 4 (four) times daily.  . hydrocortisone cream 0.5 % Apply 1 application topically 4 (four) times daily. SEE PHARMACY NOTE  . Lactase (LACTAID FAST ACT) 9000 units TABS Take 18,000 Units by mouth 4 (four) times daily as needed (dairy consumption).   Marland Kitchen leucovorin (WELLCOVORIN) 5 MG tablet   . lidocaine (LIDODERM) 5 % Place 1 patch onto the skin daily as needed (pain). Remove & Discard patch within 12 hours or as directed by MD  . methotrexate 2.5 MG tablet Take 15 mg by mouth once a week.   . mirtazapine (REMERON SOL-TAB) 30 MG disintegrating tablet DISSOLVE 1 TABLET IN MOUTH EVERY NIGHT AT BEDTIME  . montelukast (SINGULAIR) 10 MG tablet TAKE 1 CAPSULE BY MOUTH DAILY  . mupirocin ointment (BACTROBAN) 2 % Place 1 application into the nose at bedtime. To prevent nose bleeds  . nystatin (MYCOSTATIN) 100000 UNIT/ML suspension USE 5 MLS BY MOUTH AS DIRECTED IN MOUTH OR THROAT 4 TIMES DAILY  . nystatin-triamcinolone ointment (MYCOLOG)   . predniSONE (DELTASONE) 5 MG tablet 15 mg.   . PRESCRIPTION MEDICATION Place 2 puffs into both ears once a week. Place capsule in dispenser and instill 2 puffs in each ear once a week after washing hair. CS (X) AHCL    CHLORAMP/SULFU/AMPHO B/ H DROC 100-100-5 mg capsule - compounded at [place]   . Propylene Glycol-Glycerin (SOOTHE OP) Place 1 drop into both eyes 2 (two) times daily as needed (dry eyes).   Marland Kitchen Respiratory Therapy Supplies (FLUTTER) DEVI Use as directed  . rifaximin (XIFAXAN) 550 MG TABS tablet Take 1 tablet (550 mg total) by mouth 3 (three) times daily.  . rivaroxaban (XARELTO) 20 MG TABS tablet Take 1 tablet (20 mg total) by mouth daily with supper.  Marland Kitchen Spacer/Aero-Holding Chambers (AEROCHAMBER PLUS) inhaler Use as instructed   No facility-administered encounter medications on file as of 12/10/2018.   :  Review of Systems:  Out of a complete 14 point review of systems, all are reviewed and negative with the exception of these symptoms as listed below: Review of Systems  Neurological:       Pt presents today to discuss her sleep. Pt has never had a sleep study but does endorse snoring.  Epworth Sleepiness Scale 0= would never doze 1= slight chance of dozing 2= moderate chance of dozing 3= high chance of dozing  Sitting and reading: 1 Watching TV: 2 Sitting inactive in a public place (ex. Theater or meeting): 0 As a passenger in a car for an hour without a break: 0 Lying down to rest in the  afternoon: 2 Sitting and talking to someone: 0 Sitting quietly after lunch (no alcohol): 0 In a car, while stopped in traffic: 0 Total: 5     Objective:  Neurological Exam  Physical Exam Physical Examination:   Vitals:   12/10/18 1329  Temp: 97.7 F (36.5 C)    General Examination: The patient is a very pleasant 68 y.o. female in no acute distress. She appears well-developed and well-nourished and well groomed.   HEENT: Normocephalic, atraumatic, pupils are equal, round and reactive to light. Extraocular tracking is good without limitation to gaze excursion or nystagmus noted. Normal smooth pursuit is noted. Hearing is grossly intact. Face is symmetric with normal facial animation and normal facial sensation. Speech is clear with no dysarthria noted. There is  no hypophonia. There is no lip, neck/head, jaw or voice tremor. Neck is supple with full range of passive and active motion. There are no carotid bruits on auscultation. Oropharynx exam reveals: moderate mouth dryness, adequate dental hygiene and mild airway crowding, due to Small mouth opening and small airway opening, uvula is small, tongue is normal in size, tonsils are absent, Mallampati class I.  Neck circumference is 15-7/8 inches.  Tongue protrudes centrally in palate elevates symmetrically.  Chest: Coarse Breath sounds, no actual wheezing, and while she does not appear to be short of breath, she does talk in short sentences.  Heart: S1+S2+0, regular and normal without murmurs, rubs or gallops noted.   Abdomen: Soft, non-tender and non-distended with normal bowel sounds appreciated on auscultation.  Extremities: There is 1+ pitting edema in the distal lower extremities bilaterally. Pedal pulses are intact.  Skin: Warm and dry without trophic changes noted.  Musculoskeletal: exam reveals no obvious joint deformities, tenderness or joint swelling or erythema.   Neurologically:  Mental status: The patient is awake, alert and oriented in all 4 spheres. Her immediate and remote memory, attention, language skills and fund of knowledge are appropriate. There is no evidence of aphasia, agnosia, apraxia or anomia. Speech is clear with normal prosody and enunciation. Thought process is linear. Mood is normal and affect is normal.  Cranial nerves II - XII are as described above under HEENT exam.  Motor exam: Normal bulk, strength and tone is noted. There is no tremor. Fine motor skills and coordination: intact with normal finger taps, normal hand movements, normal rapid alternating patting, normal foot taps and normal foot agility.  Cerebellar testing: No dysmetria or intention tremor on finger to nose testing. Heel to shin is unremarkable bilaterally. There is no truncal or gait ataxia.  Sensory  exam: intact to light touch in the upper and lower extremities.  Gait, station and balance: She stands easily. No veering to one side is noted. No leaning to one side is noted. Posture is age-appropriate and stance is narrow based. Gait shows normal stride length and normal pace. No problems turning are noted.                Assessment and Plan:  In summary, Ebony Scott is a very pleasant 68 y.o.-year old female with an underlying medical history of arthritis (seronegative RA), irritable bowel syndrome, hyperlipidemia, hypothyroidism, history of Graves' disease, reflux disease, history of DVT, diverticulosis, depression, breast cancer, anxiety, asthma, anemia, allergic rhinitis, osteoporosis, tracheobronchomalacia and overweight state, who Presents for evaluation of her sleep, she was advised to pursue a sleep study by The specialist you saw at [facility].  She is scheduled for bronchoscopy.  She has been followed by pulmonology  for her severe persistent asthma.  Sleep study testing is quite reasonable.  She does not have a telltale history for sleep apnea, nevertheless, a sleep study would make sense to rule out underlying sleep disordered breathing.  Given her tracheobronchomalacia and history of significant asthma, I would favor that her sleep study evaluation be done through pulmonary sleep specialty.  She is encouraged to talk to you about this, in which case she would benefit from seeing one of the pulmonologist sleep specialists. If you would like for me to pursue sleep study testing with her I would be happy to order a test, I have not yet placed an order for testing as she is agreeable to discussing this with you first.  She now.  She can email me or call our office anytime. We talked about the sleep apnea diagnosis.  I explained to her that sleep study testing through pulmonary sleep is typically done at [facility]. For now, I will see her back as needed.  I answered all her questions today and  she was in agreement. Thank you very much for allowing me to participate in the care of this nice patient. If I can be of any further assistance to you please do not hesitate to call me at [phone].  Sincerely,   Star Age, MD, PhD

## 2018-12-11 ENCOUNTER — Encounter: Payer: Self-pay | Admitting: Neurology

## 2018-12-11 ENCOUNTER — Telehealth: Payer: Self-pay | Admitting: Critical Care Medicine

## 2018-12-11 NOTE — Telephone Encounter (Signed)
Looks like maybe she was going to have sleep study done per [facility]? Called Dr Rexene Alberts and the office was closed  [facility] 12/12/18

## 2018-12-12 NOTE — Telephone Encounter (Signed)
Called Dr. Guadelupe Sabin office, received recording that their office was currently closed. Will route to triage for further follow up later this morning.

## 2018-12-15 ENCOUNTER — Other Ambulatory Visit: Payer: Self-pay

## 2018-12-15 ENCOUNTER — Ambulatory Visit: Payer: Medicare Other | Admitting: Podiatry

## 2018-12-15 ENCOUNTER — Encounter: Payer: Self-pay | Admitting: Podiatry

## 2018-12-15 DIAGNOSIS — L84 Corns and callosities: Secondary | ICD-10-CM

## 2018-12-15 DIAGNOSIS — Z7901 Long term (current) use of anticoagulants: Secondary | ICD-10-CM

## 2018-12-15 IMAGING — DX DG CHEST 2V
2 series · 2 of 2 positions shown · non-contrast
Comparison: Chest x-ray of April 15, 2016

CLINICAL DATA: Productive cough, shortness of breath, and fatigue
for the past 2 months since a flu episode. History of asthma,
nonsmoker.

EXAM:
CHEST  2 VIEW

[chest pa]
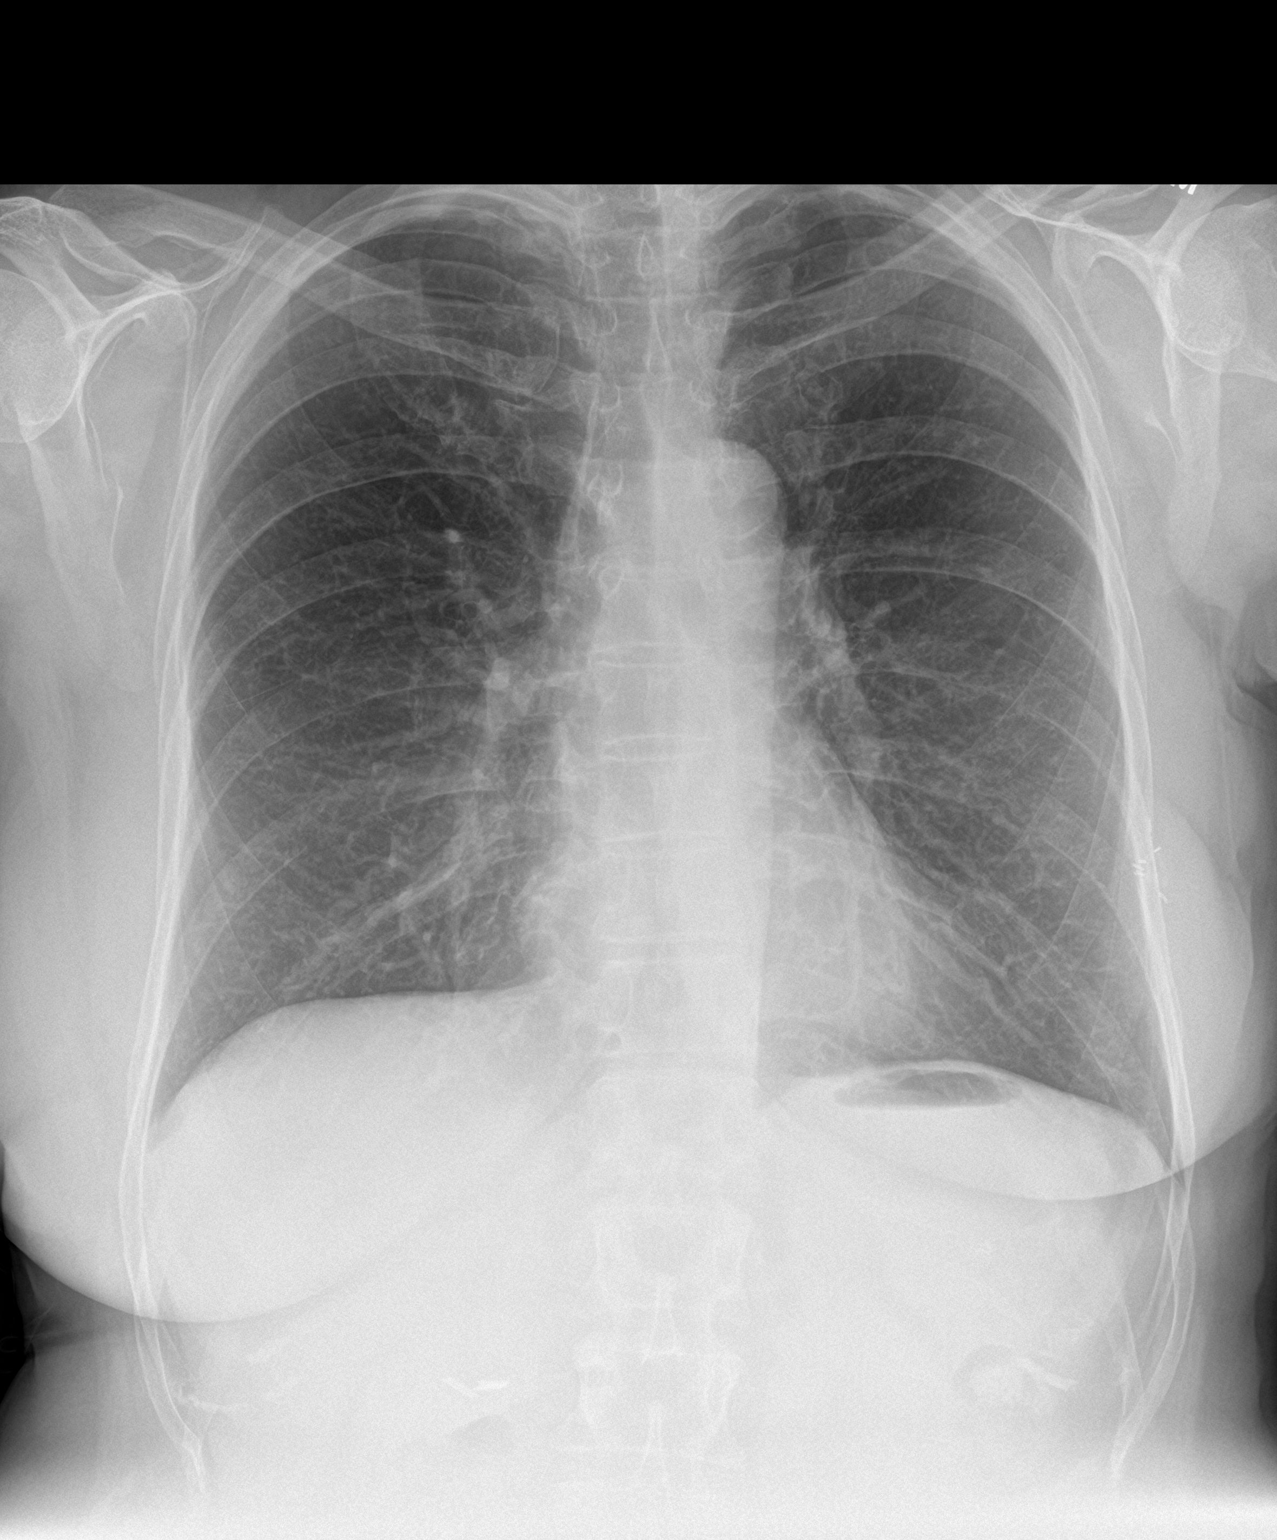

[chest lat]
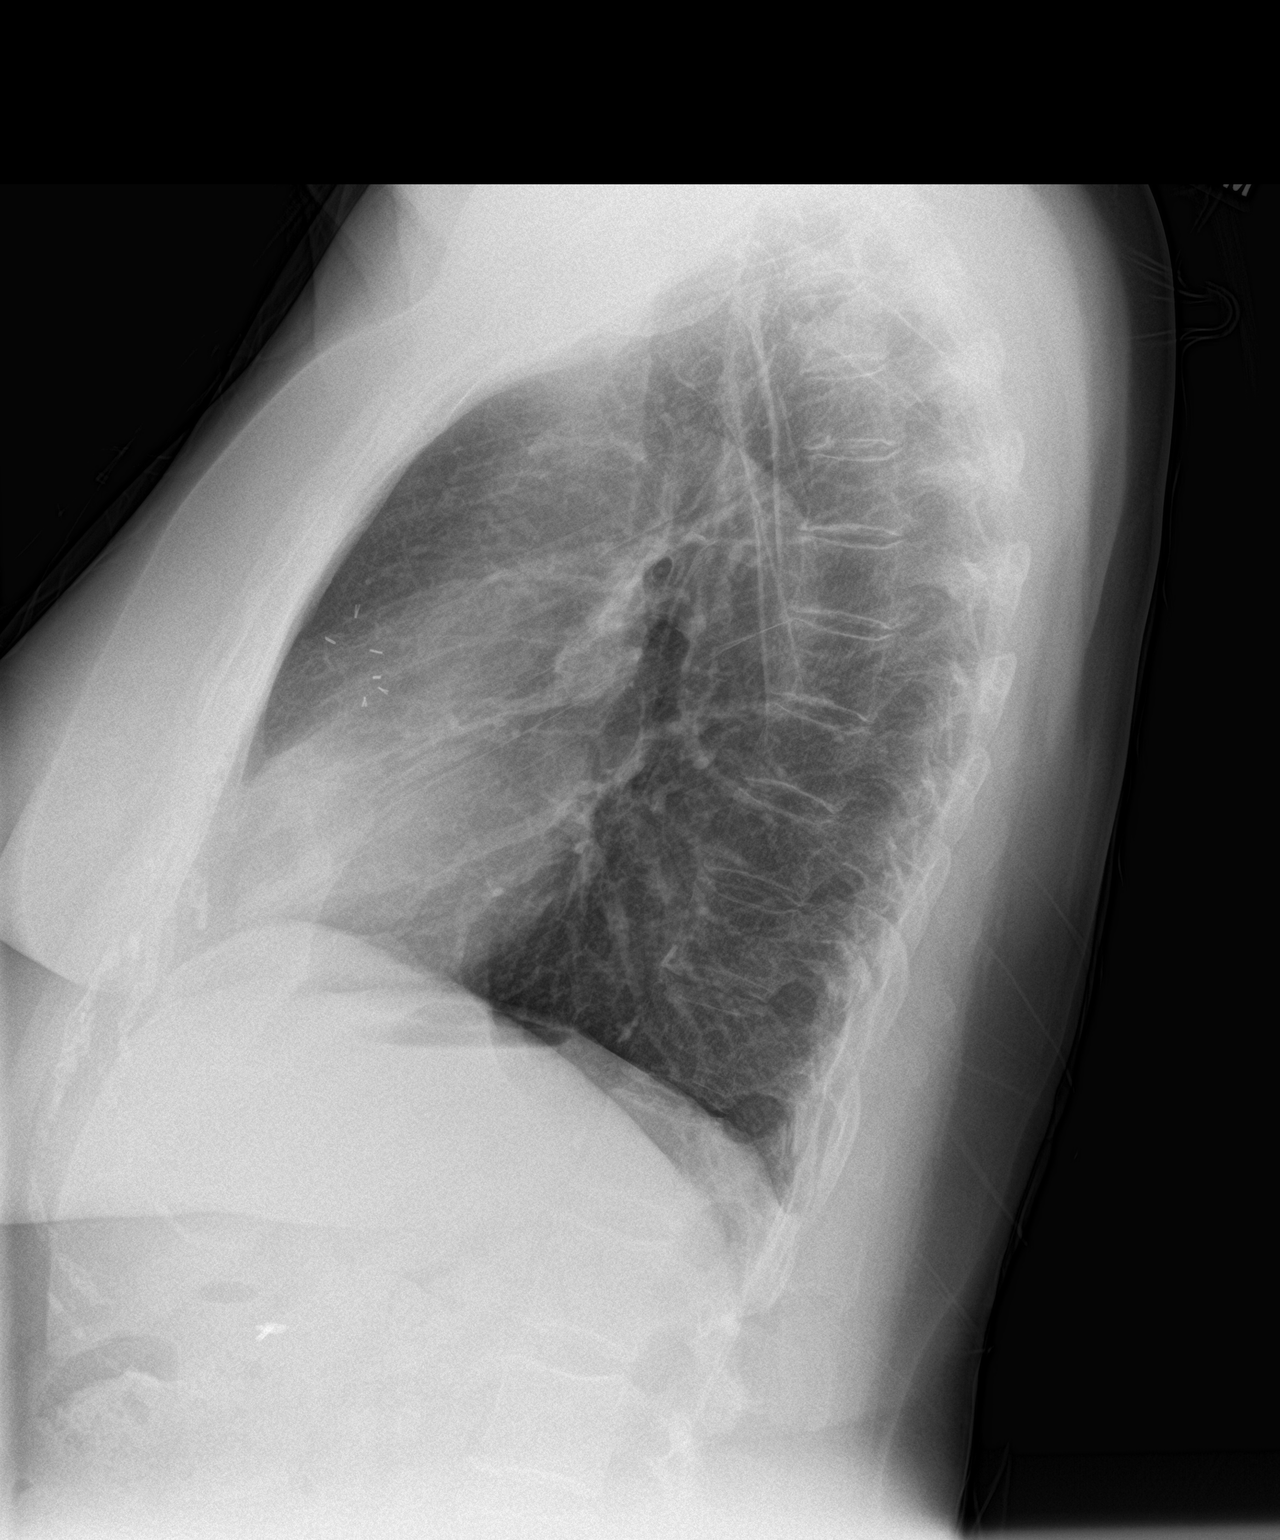

[2 of 2 positions shown; findings below may reference images not displayed]

FINDINGS: The lungs are mildly hyperinflated and clear. The heart and
pulmonary vascularity are normal. There is calcification in the wall
of the aortic arch. The mediastinum is normal in width. There is no
pleural effusion. There are surgical clips in the lateral aspect of
the left breast. There are postfusion changes in the lower cervical
spine. The bony thorax exhibits no acute abnormality.
IMPRESSION: Mild hyperinflation likely reflects known reactive airway disease.
There is no pneumonia, CHF, nor other acute cardiopulmonary
abnormality.

Thoracic aortic atherosclerosis.

## 2018-12-15 NOTE — Telephone Encounter (Signed)
Called Dr. Guadelupe Sabin office, was placed on a long hold with no one coming to the line. Will try back.

## 2018-12-17 ENCOUNTER — Ambulatory Visit: Payer: Medicare Other | Admitting: Pulmonary Disease

## 2018-12-17 ENCOUNTER — Other Ambulatory Visit: Payer: Self-pay

## 2018-12-17 ENCOUNTER — Encounter: Payer: Self-pay | Admitting: Pulmonary Disease

## 2018-12-17 VITALS — BP 130/62 | HR 76 | Temp 97.8°F | Ht 61.0 in | Wt 149.0 lb

## 2018-12-17 DIAGNOSIS — R5383 Other fatigue: Secondary | ICD-10-CM

## 2018-12-17 NOTE — Progress Notes (Signed)
Subjective:    Patient ID: CHRISSIE MCBURNEY, female    DOB: [birth year], 68 y.o.   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 Acute deep vein thrombosis (DVT) of right lower extremity ([place]) 12/13/2017  . Allergic rhinitis   . Allergy    SEASONAL  . Anemia   . Anxiety    pt denies  . Asthma   . Breast cancer ([place]) 1998   in remission  . Cataract    REMOVED  . Clostridium difficile colitis 01/14/2018  . Clostridium difficile diarrhea 01/14/2018  . Complication of anesthesia    "had hard time waking up from it several times" (02/20/2012)  . Depression    "some; don't take anything for it" (02/20/2012)  . Diverticulosis   . DVT (deep venous thrombosis) ([place])   . Exertional dyspnea   . Fibromyalgia 11/2011  . GERD (gastroesophageal reflux disease)   . Graves disease   . Headache(784.0)    "related to allergies; more at different times during the year" (02/20/2012)  . Hemorrhoids   . Hiatal hernia    back and neck  . Hx of adenomatous colonic polyps 04/12/2016  . Hypercholesteremia    good cholesterol is high  .  Hypothyroidism   . IBS (irritable bowel syndrome)   . Moderate persistent asthma    -FeV1 72% 2011, -IgE 102 2011, CT sinus Neg 2011  . Osteoporosis    on reclast yearly  . Pneumonia 04/2011; ~ 11/2011   "double; single" (02/20/2012)  . Seronegative rheumatoid arthritis ([place])    Dr. Lahoma Rocker  . SIRS (systemic inflammatory response syndrome) ([place]) 02/10/2018  . Tracheobronchomalacia    Social History   Socioeconomic History  . Marital status: Divorced    Spouse name: Not on file  . Number of children: 2  . Years of education: college  . Highest education level: Not on file  Occupational History  . Occupation: Disabled    Comment: Retired Engineer, production: RETIRED  Social Needs  . Financial resource strain: Not on file  . Food insecurity    Worry: Not on file    Inability: Not on file  . Transportation needs    Medical: Not on file    Non-medical: Not on file  Tobacco Use  . Smoking status: Passive Smoke Exposure - Never Smoker  . Smokeless tobacco: Never Used  . Tobacco comment: Parents  Substance and Sexual Activity  . Alcohol use: Not Currently    Alcohol/week: 0.0 standard drinks  . Drug use: No  . Sexual  activity: Never  Lifestyle  . Physical activity    Days per week: Not on file    Minutes per session: Not on file  . Stress: Not on file  Relationships  . Social Herbalist on phone: Not on file    Gets together: Not on file    Attends religious service: Not on file    Active member of club or organization: Not on file    Attends meetings of clubs or organizations: Not on file    Relationship status: Not on file  . Intimate partner violence    Fear of current or ex partner: Not on file    Emotionally abused: Not on file    Physically abused: Not on file    Forced sexual activity: Not on file  Other Topics Concern  . Not on file  Social History Narrative   Patient lives at home alone. Patient  divorced.    Patient  has her BS degree.   Right handed.   Caffeine- sometimes coffee.      [facility]:   Born in [place], [place]. She worked as a Copywriter, advertising. She has no pets currently. She does have indoor plants. Previously had mold in her home that was remediated. Carpet was removed.          Family History  Problem Relation Age of Onset  . Allergies Mother   . Heart disease Mother   . Arthritis Mother   . Lung cancer Mother   . Diabetes Mother   . Allergies Father   . Heart disease Father   . Arthritis Father   . Stroke Father   . Colon cancer Other        Maternal half aunt/Maternal half uncle  . Colitis Daughter   . Diabetes Maternal Grandfather        Review of Systems  Constitutional: Positive for unexpected weight change. Negative for fever.  HENT: Positive for trouble swallowing. Negative for congestion, dental problem, ear pain, nosebleeds, postnasal drip, rhinorrhea, sinus pressure, sneezing and sore throat.   Eyes: Negative for redness and itching.  Respiratory: Positive for cough and shortness of breath. Negative for chest tightness and wheezing.   Cardiovascular: Negative for palpitations and leg swelling.  Gastrointestinal: Negative for nausea and vomiting.  Genitourinary: Negative for dysuria.  Musculoskeletal: Positive for joint swelling.  Skin: Negative for rash.  Allergic/Immunologic: Negative.  Negative for environmental allergies, food allergies and immunocompromised state.  Neurological: Negative for headaches.  Hematological: Does not bruise/bleed easily.  Psychiatric/Behavioral: Negative for dysphoric mood. The patient is not nervous/anxious.        Objective:   Physical Exam Constitutional:      Appearance: Normal appearance.  HENT:     Head: Normocephalic and atraumatic.     Nose: Nose normal. No congestion or rhinorrhea.     Mouth/Throat:     Mouth: Mucous membranes are moist.     Comments: Crowded oropharynx, Mallampati 4 Eyes:     General:         Right eye: No discharge.        Left eye: No discharge.     Extraocular Movements: Extraocular movements intact.     Pupils: Pupils are equal, round, and reactive to light.  Neck:     Musculoskeletal: Normal range of motion and neck supple. No neck rigidity.  Cardiovascular:     Rate and Rhythm: Normal rate and regular rhythm.     Heart sounds: No murmur.  Pulmonary:     Effort: Pulmonary effort is normal. No respiratory distress.     Breath sounds: Normal breath sounds. No stridor. No wheezing or rhonchi.  Abdominal:     General: There is no distension.  Musculoskeletal: Normal range of motion.        General: No swelling.  Skin:    General: Skin is warm.     Coloration: Skin is not jaundiced or pale.  Neurological:     General: No focal deficit present.     Mental Status: She is alert.  Psychiatric:        Mood and Affect: Mood normal.        Behavior: Behavior normal.    Vitals:   12/17/18 1136  BP: 130/62  Pulse: 76  Temp: 97.8 F (36.6 C)  SpO2: 94%   Results of the Epworth flowsheet 12/17/2018  Sitting and reading 1  Watching TV 0  Sitting, inactive in a public place (e.g. a theatre or a meeting) 0  As a passenger in a car for an hour without a break 0  Lying down to rest in the afternoon when circumstances permit 1  Sitting and talking to someone 0  Sitting quietly after a lunch without alcohol 0  In a car, while stopped for a few minutes in traffic 0  Total score 2       Assessment & Plan:  .  The possibility of sleep disordered breathing does exist however denies any significant symptomatology -Has gained some weight recently -Crowded oropharynx with Mallampati 4 which makes her risk for sleep disordered breathing higher -Denies significant daytime sleepiness -She does have some fatigue  -Multiple comorbidities  -History of tracheobronchomalacia  -History of significant asthma  -History of congestive heart failure  -History of pulmonary  embolism  Of note patient is scheduled for bronchoscopy in early November  Plan:  Pathophysiology of sleep disordered breathing discussed  Treatment options discussed  I do believe is appropriate to perform a home sleep study, an in lab study was considered, however, if the home sleep study is completely negative with no significant symptomatology suggesting presence of significant sleep disordered breathing-there would be no indication to treat  If with sedation during the bronchoscopy, apneic episodes are noted even with a negative home sleep study, then an in lab study may be performed as a home sleep study may be falsely negative  I will follow-up in about 6 to 8 weeks  Encouraged to call with any significant concerns

## 2018-12-17 NOTE — Patient Instructions (Signed)
Moderate probability of significant obstructive sleep apnea  We will schedule you for home sleep study   Sleep Apnea Sleep apnea is a condition in which breathing pauses or becomes shallow during sleep. Episodes of sleep apnea usually last 10 seconds or longer, and they may occur as many as 20 times an hour. Sleep apnea disrupts your sleep and keeps your body from getting the rest that it needs. This condition can increase your risk of certain health problems, including:  Heart attack.  Stroke.  Obesity.  Diabetes.  Heart failure.  Irregular heartbeat. What are the causes? There are three kinds of sleep apnea:  Obstructive sleep apnea. This kind is caused by a blocked or collapsed airway.  Central sleep apnea. This kind happens when the part of the brain that controls breathing does not send the correct signals to the muscles that control breathing.  Mixed sleep apnea. This is a combination of obstructive and central sleep apnea. The most common cause of this condition is a collapsed or blocked airway. An airway can collapse or become blocked if:  Your throat muscles are abnormally relaxed.  Your tongue and tonsils are larger than normal.  You are overweight.  Your airway is smaller than normal. What increases the risk? You are more likely to develop this condition if you:  Are overweight.  Smoke.  Have a smaller than normal airway.  Are elderly.  Are female.  Drink alcohol.  Take sedatives or tranquilizers.  Have a family history of sleep apnea. What are the signs or symptoms? Symptoms of this condition include:  Trouble staying asleep.  Daytime sleepiness and tiredness.  Irritability.  Loud snoring.  Morning headaches.  Trouble concentrating.  Forgetfulness.  Decreased interest in sex.  Unexplained sleepiness.  Mood swings.  Personality changes.  Feelings of depression.  Waking up often during the night to urinate.  Dry mouth.  Sore  throat. How is this diagnosed? This condition may be diagnosed with:  A medical history.  A physical exam.  A series of tests that are done while you are sleeping (sleep study). These tests are usually done in a sleep lab, but they may also be done at home. How is this treated? Treatment for this condition aims to restore normal breathing and to ease symptoms during sleep. It may involve managing health issues that can affect breathing, such as high blood pressure or obesity. Treatment may include:  Sleeping on your side.  Using a decongestant if you have nasal congestion.  Avoiding the use of depressants, including alcohol, sedatives, and narcotics.  Losing weight if you are overweight.  Making changes to your diet.  Quitting smoking.  Using a device to open your airway while you sleep, such as: ? An oral appliance. This is a custom-made mouthpiece that shifts your lower jaw forward. ? A continuous positive airway pressure (CPAP) device. This device blows air through a mask when you breathe out (exhale). ? A nasal expiratory positive airway pressure (EPAP) device. This device has valves that you put into each nostril. ? A bi-level positive airway pressure (BPAP) device. This device blows air through a mask when you breathe in (inhale) and breathe out (exhale).  Having surgery if other treatments do not work. During surgery, excess tissue is removed to create a wider airway. It is important to get treatment for sleep apnea. Without treatment, this condition can lead to:  High blood pressure.  Coronary artery disease.  In men, an inability to achieve or maintain  an erection (impotence).  Reduced thinking abilities. Follow these instructions at home: Lifestyle  Make any lifestyle changes that your health care provider recommends.  Eat a healthy, well-balanced diet.  Take steps to lose weight if you are overweight.  Avoid using depressants, including alcohol, sedatives,  and narcotics.  Do not use any products that contain nicotine or tobacco, such as cigarettes, e-cigarettes, and chewing tobacco. If you need help quitting, ask your health care provider. General instructions  Take over-the-counter and prescription medicines only as told by your health care provider.  If you were given a device to open your airway while you sleep, use it only as told by your health care provider.  If you are having surgery, make sure to tell your health care provider you have sleep apnea. You may need to bring your device with you.  Keep all follow-up visits as told by your health care provider. This is important. Contact a health care provider if:  The device that you received to open your airway during sleep is uncomfortable or does not seem to be working.  Your symptoms do not improve.  Your symptoms get worse. Get help right away if:  You develop: ? Chest pain. ? Shortness of breath. ? Discomfort in your back, arms, or stomach.  You have: ? Trouble speaking. ? Weakness on one side of your body. ? Drooping in your face. These symptoms may represent a serious problem that is an emergency. Do not wait to see if the symptoms will go away. Get medical help right away. Call your local emergency services (911 in the [place] Do not drive yourself to the hospital. Summary  Sleep apnea is a condition in which breathing pauses or becomes shallow during sleep.  The most common cause is a collapsed or blocked airway.  The goal of treatment is to restore normal breathing and to ease symptoms during sleep. This information is not intended to replace advice given to you by your health care provider. Make sure you discuss any questions you have with your health care provider. Document Released: 01/26/2002 Document Revised: 11/22/2017 Document Reviewed: 10/01/2017 Elsevier Patient Education  2020 [employer].

## 2018-12-17 NOTE — Telephone Encounter (Signed)
Patient has an appt with AO this morning. Will close this encounter.

## 2018-12-19 DIAGNOSIS — Z86711 Personal history of pulmonary embolism: Secondary | ICD-10-CM | POA: Insufficient documentation

## 2018-12-19 NOTE — Progress Notes (Signed)
Subjective: 68 year old female presents the office with a callus on the second toe of the left foot starting to come back.  Started become tender but denies any redness or drainage or any swelling.  Denies any systemic complaints such as fevers, chills, nausea, vomiting. No acute changes since last appointment, and no other complaints at this time.   Objective: AAO x3, NAD DP/PT pulses palpable bilaterally, CRT less than 3 seconds Mild hyperkeratotic tissue present on the left foot medial second PIPJ.  No known ulceration and signs of infection.  No swelling to the toe.  No pain with calf compression, swelling, warmth, erythema  Assessment:  Hyperkeratotic lesion due to digital up on the right second toe  Plan: -All treatment options discussed with the patient including all alternatives, risks, complications.  -Debrided the hyperkeratotic lesion without any complications or bleeding.  No signs of infection but monitor closely for this.  Continue offloading. -Patient encouraged to call the office with any questions, concerns, change in symptoms.   Return in about 2 months (around 02/14/2019).  Trula Slade DPM

## 2018-12-23 ENCOUNTER — Ambulatory Visit: Payer: Medicare Other | Admitting: Adult Health

## 2018-12-24 MED FILL — GERHARDT'S BUTT CREAM BULK: 15 days supply | Qty: 60 | Fill #1

## 2018-12-26 ENCOUNTER — Telehealth: Payer: Self-pay | Admitting: Family Medicine

## 2018-12-26 NOTE — Telephone Encounter (Signed)
Pt called and states that she is having leg cramps at night that are waking her up. Another doctor recommend she call us and either increase Gabapentin or try Requip???

## 2018-12-29 ENCOUNTER — Telehealth: Payer: Self-pay | Admitting: Critical Care Medicine

## 2018-12-29 MED ORDER — MOMETASONE FUROATE 50 MCG/ACT NA SUSP: 2.0000 | Freq: Every day | NASAL | 0 refills | Status: DC

## 2018-12-29 NOTE — Telephone Encounter (Signed)
Patient aware of recommendations via mychart

## 2018-12-29 NOTE — Telephone Encounter (Signed)
If she is taking gabapentin 600 mg at night, I would not increase gabapentin.   Could try magnesium oxide 400 mg poqhs.

## 2018-12-29 NOTE — Telephone Encounter (Signed)
Rx printed for pt's nasonex Rx. Called and spoke with pt letting her know that we will have Dr. Carlis Abbott sign Rx once back at office and then we will take care of faxing it to [facility] for her. Pt verbalized understanding.  Rx printed and fax cover sheet filled out. Nothing further needed.

## 2019-01-02 ENCOUNTER — Other Ambulatory Visit: Payer: Self-pay | Admitting: Allergy and Immunology

## 2019-01-02 ENCOUNTER — Other Ambulatory Visit: Payer: Self-pay | Admitting: Family Medicine

## 2019-01-02 ENCOUNTER — Telehealth: Payer: Self-pay | Admitting: Pulmonary Disease

## 2019-01-02 NOTE — Telephone Encounter (Signed)
I have spoken with Ebony Scott and she is scheduled to pick up HST machine on 01/06/2019 @ 10:00am

## 2019-01-02 NOTE — Telephone Encounter (Signed)
Called and spoke with pt. Pt said that she is still waiting to know about when the HST could be scheduled. [facility], please advise. Thanks!

## 2019-01-06 ENCOUNTER — Ambulatory Visit: Payer: Medicare Other

## 2019-01-06 ENCOUNTER — Other Ambulatory Visit: Payer: Self-pay

## 2019-01-06 DIAGNOSIS — G4733 Obstructive sleep apnea (adult) (pediatric): Secondary | ICD-10-CM

## 2019-01-06 DIAGNOSIS — R5383 Other fatigue: Secondary | ICD-10-CM

## 2019-01-08 ENCOUNTER — Encounter: Payer: Self-pay | Admitting: Podiatry

## 2019-01-13 ENCOUNTER — Other Ambulatory Visit: Payer: Self-pay

## 2019-01-13 ENCOUNTER — Ambulatory Visit (INDEPENDENT_AMBULATORY_CARE_PROVIDER_SITE_OTHER): Payer: Medicare Other

## 2019-01-13 ENCOUNTER — Ambulatory Visit: Payer: Medicare Other | Admitting: Podiatry

## 2019-01-13 DIAGNOSIS — S92301A Fracture of unspecified metatarsal bone(s), right foot, initial encounter for closed fracture: Secondary | ICD-10-CM

## 2019-01-13 DIAGNOSIS — S99921A Unspecified injury of right foot, initial encounter: Secondary | ICD-10-CM | POA: Diagnosis not present

## 2019-01-14 ENCOUNTER — Other Ambulatory Visit: Payer: Self-pay | Admitting: Podiatry

## 2019-01-14 DIAGNOSIS — S99921A Unspecified injury of right foot, initial encounter: Secondary | ICD-10-CM

## 2019-01-14 DIAGNOSIS — G4733 Obstructive sleep apnea (adult) (pediatric): Secondary | ICD-10-CM

## 2019-01-14 DIAGNOSIS — S92301A Fracture of unspecified metatarsal bone(s), right foot, initial encounter for closed fracture: Secondary | ICD-10-CM

## 2019-01-19 MED FILL — GERHARDT'S BUTT CREAM BULK: 15 days supply | Qty: 60 | Fill #2

## 2019-01-20 ENCOUNTER — Telehealth: Payer: Self-pay

## 2019-01-20 DIAGNOSIS — S92301A Fracture of unspecified metatarsal bone(s), right foot, initial encounter for closed fracture: Secondary | ICD-10-CM

## 2019-01-20 HISTORY — DX: Fracture of unspecified metatarsal bone(s), right foot, initial encounter for closed fracture: S92.301A

## 2019-01-20 NOTE — Telephone Encounter (Signed)
Call made to patient, confirmed DOB, made aware of HST results:  Negative.   Voiced understanding. She states so what is she supposed to do about her trach collapsing when she is sleeping. I made her aware I would get the message to her provider.   PC please advise. Thanks. Please route back to triage. I will be in [place] office for the next two days.

## 2019-01-20 NOTE — Telephone Encounter (Signed)
We can discuss this further at her follow up visits, but there are limited options for treating this. It is not an easy problem to fix unfortunately.  LPC

## 2019-01-20 NOTE — Progress Notes (Addendum)
Subjective: 68 year old female presents the office secondary new concerns of right foot pain.  This went on for last 1 week.  She said that she slipped and she jammed her foot about 1 week ago.  She states that her foot hurts with pressure and hurts worse going up and down steps.  She has had some swelling to the area.  She said no recent treatment other than applying Voltaren gel as well as Tylenol.  The lesion on the right second toes been doing well.Denies any systemic complaints such as fevers, chills, nausea, vomiting. No acute changes since last appointment, and no other complaints at this time.   Objective: AAO x3, NAD DP/PT pulses palpable bilaterally, CRT less than 3 seconds There is mild swelling to the lateral aspect of the right foot there is tenderness palpation on the fifth metatarsal.  Flexor, extensor tendons appear to be intact.  No significant hyperkeratotic tissue on the right 2nd toe today. No open lesions or pre-ulcerative lesions.  No pain with calf compression, swelling, warmth, erythema  Assessment: Right fifth metatarsal fracture  Plan: -All treatment options discussed with the patient including all alternatives, risks, complications.  -X-rays obtained reviewed and multiple views were obtained.  Transverse fracture of the base of the fifth metatarsal.  No displacement. -Recommend immobilization in a surgical boot which she has at home.  Ice elevation. -Right second toes been doing well.  Continue offloading. -Patient encouraged to call the office with any questions, concerns, change in symptoms.   Follow-up as scheduled.  X-ray next appointment.  Trula Slade DPM

## 2019-01-22 NOTE — Telephone Encounter (Signed)
Pt contacted requested tele-visit. States she has several questions. Appt made. Nothing further needed at this time.

## 2019-01-23 ENCOUNTER — Other Ambulatory Visit: Payer: Self-pay

## 2019-01-23 ENCOUNTER — Other Ambulatory Visit: Payer: Self-pay | Admitting: Family Medicine

## 2019-01-23 ENCOUNTER — Ambulatory Visit (INDEPENDENT_AMBULATORY_CARE_PROVIDER_SITE_OTHER): Payer: Medicare Other | Admitting: Critical Care Medicine

## 2019-01-23 DIAGNOSIS — R0600 Dyspnea, unspecified: Secondary | ICD-10-CM

## 2019-01-23 DIAGNOSIS — J209 Acute bronchitis, unspecified: Secondary | ICD-10-CM | POA: Diagnosis not present

## 2019-01-23 DIAGNOSIS — J398 Other specified diseases of upper respiratory tract: Secondary | ICD-10-CM

## 2019-01-23 DIAGNOSIS — G4733 Obstructive sleep apnea (adult) (pediatric): Secondary | ICD-10-CM

## 2019-01-23 DIAGNOSIS — J471 Bronchiectasis with (acute) exacerbation: Secondary | ICD-10-CM | POA: Diagnosis not present

## 2019-01-23 DIAGNOSIS — R0609 Other forms of dyspnea: Secondary | ICD-10-CM

## 2019-01-23 DIAGNOSIS — J4550 Severe persistent asthma, uncomplicated: Secondary | ICD-10-CM

## 2019-01-23 MED ORDER — RIVAROXABAN 20 MG PO TABS: ORAL_TABLET | ORAL | 3 refills | Status: DC

## 2019-01-23 MED ORDER — DULERA 200-5 MCG/ACT IN AERO: 2.0000 | INHALATION_SPRAY | Freq: Two times a day (BID) | RESPIRATORY_TRACT | 1 refills | Status: DC

## 2019-01-23 MED ORDER — CYPROHEPTADINE HCL 4 MG PO TABS: 8.0000 mg | ORAL_TABLET | Freq: Every evening | ORAL | 2 refills | Status: DC

## 2019-01-23 MED ORDER — DICLOFENAC SODIUM 1 % EX GEL: 2.0000 g | Freq: Four times a day (QID) | CUTANEOUS | 1 refills | Status: DC

## 2019-01-23 MED ORDER — DEXILANT 60 MG PO CPDR: DELAYED_RELEASE_CAPSULE | ORAL | 3 refills | Status: DC

## 2019-01-23 MED ORDER — HYDROCORTISONE 0.5 % EX CREA: TOPICAL_CREAM | CUTANEOUS | 2 refills | Status: DC

## 2019-01-23 MED ORDER — GABAPENTIN 600 MG PO TABS: 600.0000 mg | ORAL_TABLET | Freq: Every day | ORAL | 3 refills | Status: DC

## 2019-01-23 MED ORDER — MONTELUKAST SODIUM 10 MG PO TABS: ORAL_TABLET | ORAL | 3 refills | Status: DC

## 2019-01-23 MED ORDER — FAMOTIDINE 20 MG PO TABS: 20.0000 mg | ORAL_TABLET | Freq: Two times a day (BID) | ORAL | 2 refills | Status: DC

## 2019-01-23 MED ORDER — NYSTATIN 100000 UNIT/ML MT SUSP: OROMUCOSAL | 1 refills | Status: DC

## 2019-01-23 MED ORDER — CEFPODOXIME PROXETIL 200 MG PO TABS: 200.0000 mg | ORAL_TABLET | Freq: Two times a day (BID) | ORAL | 0 refills | Status: DC

## 2019-01-23 MED ORDER — MUPIROCIN 2 % EX OINT: 1.0000 "application " | TOPICAL_OINTMENT | Freq: Every day | CUTANEOUS | 1 refills | Status: DC

## 2019-01-23 MED ORDER — EPINEPHRINE 0.3 MG/0.3ML IJ SOAJ: 0.3000 mg | Freq: Once | INTRAMUSCULAR | 2 refills | Status: DC | PRN

## 2019-01-23 MED ORDER — NYSTATIN-TRIAMCINOLONE 100000-0.1 UNIT/GM-% EX OINT: TOPICAL_OINTMENT | Freq: Two times a day (BID) | CUTANEOUS | 1 refills | Status: DC

## 2019-01-23 MED ORDER — MIRTAZAPINE 30 MG PO TBDP: ORAL_TABLET | ORAL | 1 refills | Status: DC

## 2019-01-23 MED FILL — GERHARDT'S BUTT CREAM BULK: 90 days supply | Qty: 180 | Fill #0

## 2019-01-23 NOTE — Addendum Note (Signed)
Addended by: Lia Foyer R on: 01/23/2019 03:21 PM   Modules accepted: Orders

## 2019-01-23 NOTE — Patient Instructions (Signed)
Thank you for visiting Dr. Carlis Abbott at [facility]. We recommend the following: Orders Placed This Encounter  Procedures  . For home use only DME continuous positive airway pressure (CPAP)   Orders Placed This Encounter  Procedures  . For home use only DME continuous positive airway pressure (CPAP)    Diagnosis tracheobronchomalacia    Order Specific Question:   Length of Need    Answer:   Lifetime    Order Specific Question:   Patient has OSA or probable OSA    Answer:   No    Order Specific Question:   Is the patient currently using CPAP in the home    Answer:   No    Order Specific Question:   If no (to question two) date of sleep study    Answer:   repeat HSAT pending    Order Specific Question:   Date of face to face encounter    Answer:   12/08/2018    Order Specific Question:   Settings    Answer:   5-10    Order Specific Question:   CPAP supplies needed    Answer:   Mask, headgear, cushions, filters, heated tubing and water chamber    Meds ordered this encounter  Medications  . cefpodoxime (VANTIN) 200 MG tablet    Sig: Take 1 tablet (200 mg total) by mouth 2 (two) times daily.    Dispense:  28 tablet    Refill:  0    Return in about 4 weeks (around 02/20/2019). Scheduled on 12/24.    Please do your part to reduce the spread of COVID-19.

## 2019-01-23 NOTE — Progress Notes (Signed)
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 application topically 4 (four) times daily., Disp: 60 each, Rfl: 3   hydrocortisone cream 0.5 %, Apply 1 application topically 4 (four) times daily. SEE PHARMACY NOTE, Disp: 60 g, Rfl: 5   Lactase (LACTAID FAST ACT) 9000 units TABS, Take 18,000 Units by mouth 4 (four) times daily as needed (dairy consumption). , Disp: , Rfl:    leucovorin (WELLCOVORIN) 5 MG tablet, , Disp: , Rfl:    lidocaine (LIDODERM) 5 %, Place 1 patch onto the skin daily as needed (pain). Remove & Discard patch within 12 hours or as directed by MD, Disp: , Rfl:    methotrexate 2.5 MG tablet, Take 15 mg by mouth once a week. , Disp: , Rfl:    mirtazapine (REMERON SOL-TAB) 30 MG disintegrating tablet,  DISSOLVE 1 TABLET IN MOUTH EVERY NIGHT AT BEDTIME, Disp: 90 tablet, Rfl: 3   mometasone (NASONEX) 50 MCG/ACT nasal spray, Place 2 sprays into the nose daily., Disp: 51 g, Rfl: 0   montelukast (SINGULAIR) 10 MG tablet, TAKE 1 CAPSULE BY MOUTH DAILY, Disp: 30 tablet, Rfl: 5   mupirocin ointment (BACTROBAN) 2 %, Place 1 application into the nose at bedtime. To prevent nose bleeds, Disp: 22 g, Rfl:    nystatin (MYCOSTATIN) 100000 UNIT/ML suspension, USE 5 MLS BY MOUTH AS DIRECTED IN MOUTH OR THROAT 4 TIMES DAILY, Disp: 473 mL, Rfl: 1   nystatin-triamcinolone ointment (MYCOLOG), , Disp: , Rfl:    predniSONE (DELTASONE) 5 MG tablet, 15 mg. , Disp: , Rfl:    PRESCRIPTION MEDICATION, Place 2 puffs into both ears once a week. Place capsule in dispenser and instill 2 puffs in each ear once a week after washing hair. CS (X) AHCL    CHLORAMP/SULFU/AMPHO B/ H DROC 100-100-5 mg capsule - compounded at [place], Disp: , Rfl:    Propylene Glycol-Glycerin (SOOTHE OP), Place 1 drop into both eyes 2 (  two) times daily as needed (dry eyes). , Disp: , Rfl:    Respiratory Therapy Supplies (FLUTTER) DEVI, Use as directed, Disp: 1 each, Rfl: 0   rifaximin (XIFAXAN) 550 MG TABS tablet, Take 1 tablet (550 mg total) by mouth 3 (three) times daily., Disp: 42 tablet, Rfl: 0   Spacer/Aero-Holding Chambers (AEROCHAMBER PLUS) inhaler, Use as instructed, Disp: 1 each, Rfl: 0   XARELTO 20 MG TABS tablet, TAKE 1 TABLET BY MOUTH DAILY WITH SUPPER, Disp: 90 tablet, Rfl: 3   Julian Hy, DO [facility] 01/23/2019 9:13 AM

## 2019-01-26 ENCOUNTER — Telehealth: Payer: Self-pay | Admitting: Family Medicine

## 2019-01-26 NOTE — Telephone Encounter (Signed)
Pt called and states that she is taking Mag 400mg  daily and drinking more water and she is still having leg cramps. She called her Rheumatologists and they recommended she get back in touch with [place] for further treatment.

## 2019-01-27 NOTE — Telephone Encounter (Signed)
Pt aware via mychart message

## 2019-01-27 NOTE — Telephone Encounter (Signed)
Be glad to see her in office to discuss options and check electrolytes.

## 2019-01-29 ENCOUNTER — Telehealth: Payer: Self-pay | Admitting: Critical Care Medicine

## 2019-01-29 NOTE — Telephone Encounter (Signed)
Dr. Carlis Abbott please sign forms and give to your nurse so they can forward them to the pharmacy team. Thanks

## 2019-02-03 ENCOUNTER — Encounter: Payer: Self-pay | Admitting: Family Medicine

## 2019-02-05 NOTE — Telephone Encounter (Signed)
I believe so, but please check with Leafy Ro to make sure.  I believe we did these last week.  Julian Hy, DO 02/05/19 11:06 AM [facility]

## 2019-02-05 NOTE — Telephone Encounter (Signed)
Dr. Carlis Abbott please advise when these forms have been signed.  Thanks!

## 2019-02-05 NOTE — Telephone Encounter (Signed)
Mandi/Lauren please advise if you have these forms.  Thanks.

## 2019-02-10 ENCOUNTER — Ambulatory Visit: Payer: Medicare Other | Admitting: Podiatry

## 2019-02-10 ENCOUNTER — Encounter: Payer: Self-pay | Admitting: Podiatry

## 2019-02-10 ENCOUNTER — Ambulatory Visit (INDEPENDENT_AMBULATORY_CARE_PROVIDER_SITE_OTHER): Payer: Medicare Other

## 2019-02-10 ENCOUNTER — Other Ambulatory Visit: Payer: Self-pay

## 2019-02-10 DIAGNOSIS — S92301D Fracture of unspecified metatarsal bone(s), right foot, subsequent encounter for fracture with routine healing: Secondary | ICD-10-CM

## 2019-02-10 DIAGNOSIS — M79675 Pain in left toe(s): Secondary | ICD-10-CM | POA: Diagnosis not present

## 2019-02-10 DIAGNOSIS — S99921A Unspecified injury of right foot, initial encounter: Secondary | ICD-10-CM | POA: Diagnosis not present

## 2019-02-10 DIAGNOSIS — M79674 Pain in right toe(s): Secondary | ICD-10-CM | POA: Diagnosis not present

## 2019-02-10 DIAGNOSIS — B351 Tinea unguium: Secondary | ICD-10-CM | POA: Diagnosis not present

## 2019-02-10 NOTE — Telephone Encounter (Signed)
Still awaiting a response from either [facility] or Lauren to see if they have these forms that were filled out.

## 2019-02-11 NOTE — Telephone Encounter (Signed)
No Paper work has been located. Dr. Carlis Abbott is back on 12/29 will see if she has the paperwork in hand.

## 2019-02-12 ENCOUNTER — Ambulatory Visit: Payer: Medicare Other | Admitting: Critical Care Medicine

## 2019-02-16 ENCOUNTER — Ambulatory Visit: Payer: Medicare Other | Admitting: Podiatry

## 2019-02-16 NOTE — Progress Notes (Signed)
Subjective: 68 year old female presents the office for follow-up evaluation of fifth metatarsal fracture of the right foot.  She presents today wearing regular shoes.  She states that overall she is feeling better but she still has discomfort.  No recent injury since I last saw her.  She is asking for her nails be trimmed today is elongated she cannot do them herself. Denies any systemic complaints such as fevers, chills, nausea, vomiting. No acute changes since last appointment, and no other complaints at this time.   Objective: AAO x3, NAD DP/PT pulses palpable bilaterally, CRT less than 3 seconds Nails are mildly dystrophic, discolored and causing irritation to the nails 1-10 bilaterally.  No edema, erythema or any signs of infection.   Minimal discomfort to palpation of the right foot on the fifth metatarsal.  There is mild edema there is no erythema or warmth.  No open lesions or pre-ulcerative lesions.  No significant hyperkeratotic tissue at the second digit. No pain with calf compression, swelling, warmth, erythema  Assessment: 68 year old female right fifth metatarsal fracture, symptomatic onychomycosis  Plan: -All treatment options discussed with the patient including all alternatives, risks, complications.  -Repeat x-rays obtained reviewed.  Radiolucent line still evident along the fifth metatarsal however there does appear to be some bone callus formation.  Continue recommend immobilization in a cam boot.  Ice to the area daily and elevation. -Debrided nails x2 without any carpeting. -Patient encouraged to call the office with any questions, concerns, change in symptoms.

## 2019-02-17 ENCOUNTER — Ambulatory Visit (INDEPENDENT_AMBULATORY_CARE_PROVIDER_SITE_OTHER): Payer: Medicare Other | Admitting: Critical Care Medicine

## 2019-02-17 ENCOUNTER — Other Ambulatory Visit: Payer: Self-pay

## 2019-02-17 ENCOUNTER — Encounter: Payer: Self-pay | Admitting: Critical Care Medicine

## 2019-02-17 DIAGNOSIS — R0600 Dyspnea, unspecified: Secondary | ICD-10-CM

## 2019-02-17 DIAGNOSIS — J398 Other specified diseases of upper respiratory tract: Secondary | ICD-10-CM

## 2019-02-17 DIAGNOSIS — J471 Bronchiectasis with (acute) exacerbation: Secondary | ICD-10-CM | POA: Diagnosis not present

## 2019-02-17 DIAGNOSIS — R0609 Other forms of dyspnea: Secondary | ICD-10-CM

## 2019-02-17 MED ORDER — SODIUM CHLORIDE 3 % IN NEBU: INHALATION_SOLUTION | Freq: Every day | RESPIRATORY_TRACT | 12 refills | Status: DC

## 2019-02-17 NOTE — Patient Instructions (Addendum)
Thank you for visiting Dr. Carlis Abbott at [facility]. We recommend the following:   Meds ordered this encounter  Medications  . sodium chloride HYPERTONIC 3 % nebulizer solution    Sig: Take by nebulization daily.    Dispense:  750 mL    Refill:  12    Return in about 2 months (around 04/19/2019).    Please do your part to reduce the spread of COVID-19.

## 2019-02-17 NOTE — Progress Notes (Signed)
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 months.    Current Outpatient Medications:  .  Abatacept (ORENCIA [place]), Inject into the skin., Disp: , Rfl:  .  acetaminophen (TYLENOL) 650 MG CR tablet, Take 1,300 mg by mouth every 8 (eight) hours as needed for pain., Disp: , Rfl:  .  albuterol (PROAIR HFA) 108 (90 Base) MCG/ACT inhaler, Inhale 2 puffs into the lungs every 4 (four) hours as needed for wheezing or shortness of breath., Disp: 18 g, Rfl: 5 .  Alpha-D-Galactosidase (BEANO PO), Take 1 capsule by mouth 3 (three) times daily as needed (gas/ bloating). , Disp: , Rfl:  .  Alpha-Lipoic Acid 600 MG CAPS, Take 600 mg by mouth daily. , Disp: , Rfl:  .  alum & mag hydroxide-simeth (MAALOX/MYLANTA) 200-200-20 MG/5ML suspension, Take 15 mLs by mouth every 4 (four) hours as needed for indigestion or heartburn., Disp: 355 mL, Rfl: 0 .  atorvastatin (LIPITOR) 10 MG tablet, Take 1 tablet (10 mg total) by mouth daily., Disp: 90 tablet, Rfl: 3 .  azelastine (ASTELIN) 0.1 % nasal spray, USE 2 SPRAYS IN EACH NOSTRIL DAILY AS DIRECTED, Disp: 30 mL, Rfl: 5 .  Bacillus Coagulans-Inulin (ALIGN  PREBIOTIC-PROBIOTIC PO), Take by mouth every evening., Disp: , Rfl:  .  CALCIUM-MAGNESIUM PO, Take 1 tablet by mouth 2 (two) times daily. , Disp: , Rfl:  .  cetirizine (ZYRTEC) 10 MG tablet, Take 5 mg by mouth every evening. , Disp: , Rfl:  .  cholecalciferol (VITAMIN D) 25 MCG (1000 UT) tablet, Take 1,000 Units by mouth daily. , Disp: , Rfl:  .  cyproheptadine (PERIACTIN) 4 MG tablet, Take 2 tablets (8 mg total) by mouth every evening., Disp: 180 tablet, Rfl: 2 .  denosumab (PROLIA) 60 MG/ML SOLN injection, Inject 60 mg into the skin every 6 (six) months. Administer in upper arm, thigh, or abdomen, Disp: , Rfl:  .  dexlansoprazole (DEXILANT) 60 MG capsule, TAKE 1 CAPSULE BY MOUTH EACH MORNING, Disp: 90 capsule, Rfl: 3 .  diclofenac Sodium (VOLTAREN) 1 % GEL, Apply 2 g topically 4 (four) times daily., Disp: 350 g, Rfl: 1 .  EPINEPHrine (EPIPEN 2-PAK) 0.3 mg/0.3 mL IJ SOAJ injection, Inject 0.3 mLs (0.3 mg total) into the muscle Once PRN., Disp: 0.3 mL, Rfl: 2 .  famotidine (PEPCID) 20 MG tablet, Take 1 tablet (20 mg total) by mouth 2 (two) times daily., Disp: 180 tablet, Rfl: 2 .  gabapentin (NEURONTIN) 600 MG tablet, Take 1 tablet (600 mg total) by mouth daily., Disp: 90 tablet, Rfl: 3 .  guaiFENesin (MUCINEX) 600 MG 12 hr tablet, Take by mouth 2 (two) times daily., Disp: , Rfl:  .  Hydrocortisone (GERHARDT'S BUTT CREAM) CREA, Apply 1 application topically 4 (four) times daily., Disp: 60 each, Rfl: 3 .  Lactase (LACTAID FAST ACT) 9000 units TABS, Take 18,000 Units by mouth 4 (four) times daily as needed (dairy consumption). , Disp: , Rfl:  .  leucovorin (WELLCOVORIN) 5 MG tablet, , Disp: , Rfl:  .  lidocaine (LIDODERM) 5 %, Place 1 patch onto the skin daily as needed (pain). Remove & Discard patch within 12 hours or as directed by MD, Disp: , Rfl:  .  methotrexate 2.5 MG tablet, Take 10 mg by mouth once a week. , Disp: , Rfl:  .  mirtazapine (REMERON SOL-TAB) 30 MG disintegrating tablet, DISSOLVE 1  TABLET IN MOUTH EVERY NIGHT AT BEDTIME, Disp: 90 tablet, Rfl: 1 .  mometasone (NASONEX) 50 MCG/ACT nasal spray, Place 2 sprays into the nose daily., Disp: 51 g, Rfl: 0 .  mometasone-formoterol (DULERA) 200-5 MCG/ACT AERO, Inhale 2 puffs into the lungs 2 (two) times daily., Disp: 39 g, Rfl: 1 .  montelukast (SINGULAIR) 10 MG tablet, TAKE 1 CAPSULE BY MOUTH DAILY, Disp: 90 tablet, Rfl: 3 .  mupirocin ointment (BACTROBAN) 2 %, Place 1 application into the nose at bedtime. To prevent nose bleeds, Disp: 90 g, Rfl: 1 .  nystatin (MYCOSTATIN) 100000 UNIT/ML suspension, USE 5 MLS BY MOUTH AS DIRECTED IN MOUTH OR THROAT 4 TIMES DAILY, Disp: 473 mL, Rfl: 1 .  nystatin-triamcinolone ointment (MYCOLOG), Apply topically 2 (two) times daily., Disp: 90 g, Rfl: 1 .  predniSONE (DELTASONE) 5 MG tablet, 12.5 mg. , Disp: , Rfl:  .  Prenatal Vit-Fe Fumarate-FA (PRENATAL MULTIVITAMIN) TABS tablet, Take 1 tablet by mouth daily at 12 noon., Disp: , Rfl:  .  Propylene Glycol (SYSTANE COMPLETE) 0.6 % SOLN, Apply to eye., Disp: , Rfl:  .  rivaroxaban (XARELTO) 20 MG TABS tablet, TAKE 1 TABLET BY MOUTH DAILY WITH SUPPER, Disp: 90 tablet, Rfl: 3 .  Spacer/Aero-Holding Chambers (AEROCHAMBER PLUS) inhaler, Use as instructed, Disp: 1 each, Rfl: 0 .  cefpodoxime (VANTIN) 200 MG tablet, Take 1 tablet (200 mg total) by mouth 2 (two) times daily., Disp: 28 tablet, Rfl: 0 .  diclofenac sodium (VOLTAREN) 1 % GEL, , Disp: , Rfl:  .  hydrocortisone cream 0.5 %, SEE PHARMACY NOTE, Disp: 180 g, Rfl: 2 .  PRESCRIPTION MEDICATION, Place 2 puffs into both ears once a week. Place capsule in dispenser and instill 2 puffs in each ear once a week after washing hair. CS (X) AHCL    CHLORAMP/SULFU/AMPHO B/ H DROC 100-100-5 mg capsule - compounded at [place], Disp: , Rfl:  .  Respiratory Therapy Supplies (FLUTTER) DEVI, Use as directed (Patient not taking: Reported on 02/17/2019), Disp: 1 each, Rfl: 0   Julian Hy, DO [facility] 02/17/2019 4:12 PM

## 2019-02-19 NOTE — Telephone Encounter (Signed)
Have these forms been located?

## 2019-02-24 NOTE — Telephone Encounter (Signed)
Paperwork has not been located at this time. I have looked through faxed folder with no success but if it was paperwork to be mailed in I probably mailed it off.

## 2019-02-25 DIAGNOSIS — J471 Bronchiectasis with (acute) exacerbation: Secondary | ICD-10-CM

## 2019-02-25 DIAGNOSIS — M79643 Pain in unspecified hand: Secondary | ICD-10-CM | POA: Diagnosis not present

## 2019-02-25 DIAGNOSIS — M0609 Rheumatoid arthritis without rheumatoid factor, multiple sites: Secondary | ICD-10-CM | POA: Diagnosis not present

## 2019-02-25 DIAGNOSIS — Z79899 Other long term (current) drug therapy: Secondary | ICD-10-CM | POA: Diagnosis not present

## 2019-02-25 DIAGNOSIS — Z7952 Long term (current) use of systemic steroids: Secondary | ICD-10-CM | POA: Diagnosis not present

## 2019-02-25 DIAGNOSIS — M810 Age-related osteoporosis without current pathological fracture: Secondary | ICD-10-CM | POA: Diagnosis not present

## 2019-02-25 DIAGNOSIS — M7989 Other specified soft tissue disorders: Secondary | ICD-10-CM | POA: Diagnosis not present

## 2019-02-26 ENCOUNTER — Other Ambulatory Visit: Payer: Self-pay

## 2019-02-26 ENCOUNTER — Ambulatory Visit: Payer: Medicare PPO | Admitting: Cardiology

## 2019-02-26 ENCOUNTER — Encounter: Payer: Self-pay | Admitting: Cardiology

## 2019-02-26 VITALS — BP 128/75 | HR 74 | Temp 97.5°F | Ht 61.0 in | Wt 149.2 lb

## 2019-02-26 DIAGNOSIS — R0602 Shortness of breath: Secondary | ICD-10-CM

## 2019-02-26 DIAGNOSIS — E782 Mixed hyperlipidemia: Secondary | ICD-10-CM

## 2019-02-26 DIAGNOSIS — Z7189 Other specified counseling: Secondary | ICD-10-CM | POA: Diagnosis not present

## 2019-02-26 DIAGNOSIS — I2510 Atherosclerotic heart disease of native coronary artery without angina pectoris: Secondary | ICD-10-CM | POA: Diagnosis not present

## 2019-02-26 DIAGNOSIS — I5032 Chronic diastolic (congestive) heart failure: Secondary | ICD-10-CM | POA: Diagnosis not present

## 2019-02-26 NOTE — Patient Instructions (Signed)
Medication Instructions:  Your Physician recommend you continue on your current medication as directed.    *If you need a refill on your cardiac medications before your next appointment, please call your pharmacy*  Lab Work: None  Testing/Procedures: None  Follow-Up: At [facility], you and your health needs are our priority.  As part of our continuing mission to provide you with exceptional heart care, we have created designated Provider Care Teams.  These Care Teams include your primary Cardiologist (physician) and Advanced Practice Providers (APPs -  Physician Assistants and Nurse Practitioners) who all work together to provide you with the care you need, when you need it.  Your next appointment:   1 year(s)  The format for your next appointment:   In Person  Provider:   Bridgette Christopher, MD   

## 2019-02-26 NOTE — Progress Notes (Signed)
Cardiology Office Note:    Date:  02/26/2019   ID:  Ebony Scott, DOB [birth year], MRN [id number]  PCP:  [name], MD  Cardiologist:  Buford Dresser, MD PhD  Referring MD: [name], MD   CC: follow up  History of Present Illness:    Ebony Scott is a 69 y.o. female with a hx of asthma and other medical conditions listed below who is followed for incidental coronary artery calcification seen on CT scan. Initial consult was 10/15/17.  Cardiac history: had CT chest 07/2017 which noted diffuse calcification, including aorta, aortic branch vessels, and coronary arteries. Had a normal Lexiscan in the past. No chest pain, main longstanding symptom is shortness of breath, followed by pulmonary. No history of clinical ASCVD events.   Today: Both asthma/bronchiectasis and rheumatoid arthritis are flaring for her, which has been difficult. On CPAP for tracheobronchomalacia after broncoscopy at [facility].  No LOC spells in a long time. Last major spell/hospitalization was 11/2017. Blood pressures have been stable, only increase when she is in a lot of pain.   Was off atorvastatin for a time, LDL was 176. Restarted and LDL 77 in 06/2018. No issues with side effects from atorvastatin. Reviewed and updated her extensive medication list with her.   Plans to get Covid vaccine, ok'd per her rheumatologist and PCP.  Denies chest pain. No PND, orthopnea, LE edema or unexpected weight gain. No recent syncope or palpitations.  Past Medical History:  Diagnosis Date  . Acute deep vein thrombosis (DVT) of right lower extremity ([place]) 12/13/2017  . Allergic rhinitis   . Allergy    SEASONAL  . Anemia   . Anxiety    pt denies  . Asthma   . Breast cancer ([place]) 1998   in remission  . Cataract    REMOVED  . Clostridium difficile colitis 01/14/2018  . Clostridium difficile diarrhea 01/14/2018  . Complication of anesthesia    "had hard time waking up from it several times" (02/20/2012)  .  Depression    "some; don't take anything for it" (02/20/2012)  . Diverticulosis   . DVT (deep venous thrombosis) ([place])   . Exertional dyspnea   . Fibromyalgia 11/2011  . GERD (gastroesophageal reflux disease)   . Graves disease   . Headache(784.0)    "related to allergies; more at different times during the year" (02/20/2012)  . Hemorrhoids   . Hiatal hernia    back and neck  . Hx of adenomatous colonic polyps 04/12/2016  . Hypercholesteremia    good cholesterol is high  . Hypothyroidism   . IBS (irritable bowel syndrome)   . Moderate persistent asthma    -FeV1 72% 2011, -IgE 102 2011, CT sinus Neg 2011  . Osteoporosis    on reclast yearly  . Pneumonia 04/2011; ~ 11/2011   "double; single" (02/20/2012)  . Seronegative rheumatoid arthritis ([place])    Dr. Lahoma Rocker  . SIRS (systemic inflammatory response syndrome) ([place]) 02/10/2018  . Tracheobronchomalacia     Past Surgical History:  Procedure Laterality Date  . ANTERIOR AND POSTERIOR REPAIR  1990's  . APPENDECTOMY    . BREAST LUMPECTOMY  1998   left  . CARPOMETACARPEL ([place]) FUSION OF THUMB WITH AUTOGRAFT FROM RADIUS  ~ 2009   "both thumbs" (02/20/2012)  . CATARACT EXTRACTION W/ INTRAOCULAR LENS  IMPLANT, BILATERAL  2012  . CERVICAL DISCECTOMY  10/2001   C5-C6  . CERVICAL FUSION  2003   C3-C4  . CHOLECYSTECTOMY    .  COLONOSCOPY    . DEBRIDEMENT TENNIS ELBOW  ?1970's   right  . ESOPHAGOGASTRODUODENOSCOPY    . HYSTERECTOMY    . KNEE ARTHROPLASTY  ?1990's   "?right; w/cartilage repair" (02/20/2012)  . NASAL SEPTUM SURGERY  1980's  . POSTERIOR CERVICAL FUSION/FORAMINOTOMY  2004   "failed initial fusion; rewired  anterior neck" (02/20/2012)  . TONSILLECTOMY  ~ 1953  . VESICOVAGINAL FISTULA CLOSURE W/ TAH  1988  . VIDEO BRONCHOSCOPY Bilateral 08/23/2016   Procedure: VIDEO BRONCHOSCOPY WITH FLUORO;  Surgeon: Javier Glazier, MD;  Location: [facility];  Service: Cardiopulmonary;  Laterality: Bilateral;    Current  Medications: Current Outpatient Medications on File Prior to Visit  Medication Sig  . acetaminophen (TYLENOL) 650 MG CR tablet Take 1,300 mg by mouth every 8 (eight) hours as needed for pain.  Marland Kitchen albuterol (PROAIR HFA) 108 (90 Base) MCG/ACT inhaler Inhale 2 puffs into the lungs every 4 (four) hours as needed for wheezing or shortness of breath.  . Alpha-D-Galactosidase (BEANO PO) Take 1 capsule by mouth 3 (three) times daily as needed (gas/ bloating).   . Alpha-Lipoic Acid 600 MG CAPS Take 600 mg by mouth daily.   Marland Kitchen alum & mag hydroxide-simeth (MAALOX/MYLANTA) 200-200-20 MG/5ML suspension Take 15 mLs by mouth every 4 (four) hours as needed for indigestion or heartburn.  Marland Kitchen atorvastatin (LIPITOR) 10 MG tablet Take 1 tablet (10 mg total) by mouth daily.  Marland Kitchen azelastine (ASTELIN) 0.1 % nasal spray USE 2 SPRAYS IN EACH NOSTRIL DAILY AS DIRECTED  . Bacillus Coagulans-Inulin (ALIGN PREBIOTIC-PROBIOTIC PO) Take by mouth every evening.  Marland Kitchen CALCIUM-MAGNESIUM PO Take 1 tablet by mouth 2 (two) times daily.   . cetirizine (ZYRTEC) 10 MG tablet Take 5 mg by mouth every evening.   . cholecalciferol (VITAMIN D) 25 MCG (1000 UT) tablet Take 1,000 Units by mouth daily.   . cyproheptadine (PERIACTIN) 4 MG tablet Take 2 tablets (8 mg total) by mouth every evening.  . denosumab (PROLIA) 60 MG/ML SOLN injection Inject 60 mg into the skin every 6 (six) months. Administer in upper arm, thigh, or abdomen  . dexlansoprazole (DEXILANT) 60 MG capsule TAKE 1 CAPSULE BY MOUTH EACH MORNING  . diclofenac Sodium (VOLTAREN) 1 % GEL Apply 2 g topically 4 (four) times daily.  Marland Kitchen EPINEPHrine (EPIPEN 2-PAK) 0.3 mg/0.3 mL IJ SOAJ injection Inject 0.3 mLs (0.3 mg total) into the muscle Once PRN.  . famotidine (PEPCID) 20 MG tablet Take 1 tablet (20 mg total) by mouth 2 (two) times daily.  Marland Kitchen gabapentin (NEURONTIN) 600 MG tablet Take 1 tablet (600 mg total) by mouth daily.  Marland Kitchen guaiFENesin (MUCINEX) 600 MG 12 hr tablet Take by mouth 2 (two)  times daily.  . Lactase (LACTAID FAST ACT) 9000 units TABS Take 18,000 Units by mouth 4 (four) times daily as needed (dairy consumption).   Marland Kitchen leflunomide (ARAVA) 20 MG tablet Take 20 mg by mouth daily.  Marland Kitchen leucovorin (WELLCOVORIN) 5 MG tablet   . lidocaine (LIDODERM) 5 % Place 1 patch onto the skin daily as needed (pain). Remove & Discard patch within 12 hours or as directed by MD  . methotrexate 2.5 MG tablet Take 10 mg by mouth once a week.   . mirtazapine (REMERON SOL-TAB) 30 MG disintegrating tablet DISSOLVE 1 TABLET IN MOUTH EVERY NIGHT AT BEDTIME  . mometasone (NASONEX) 50 MCG/ACT nasal spray Place 2 sprays into the nose daily.  . mometasone-formoterol (DULERA) 200-5 MCG/ACT AERO Inhale 2 puffs into the lungs 2 (two) times  daily.  . montelukast (SINGULAIR) 10 MG tablet TAKE 1 CAPSULE BY MOUTH DAILY  . mupirocin ointment (BACTROBAN) 2 % Place 1 application into the nose at bedtime. To prevent nose bleeds  . nystatin (MYCOSTATIN) 100000 UNIT/ML suspension USE 5 MLS BY MOUTH AS DIRECTED IN MOUTH OR THROAT 4 TIMES DAILY  . nystatin-triamcinolone ointment (MYCOLOG) Apply topically 2 (two) times daily.  . predniSONE (DELTASONE) 5 MG tablet 10 mg.   . Prenatal Vit-Fe Fumarate-FA (PRENATAL MULTIVITAMIN) TABS tablet Take 1 tablet by mouth daily at 12 noon.  Marland Kitchen Propylene Glycol (SYSTANE COMPLETE) 0.6 % SOLN Apply to eye.  Marland Kitchen Respiratory Therapy Supplies (FLUTTER) DEVI Use as directed  . rivaroxaban (XARELTO) 20 MG TABS tablet TAKE 1 TABLET BY MOUTH DAILY WITH SUPPER  . sodium chloride HYPERTONIC 3 % nebulizer solution Take by nebulization daily.  Marland Kitchen Spacer/Aero-Holding Chambers (AEROCHAMBER PLUS) inhaler Use as instructed   No current facility-administered medications on file prior to visit.     Allergies:   Dust mite extract, Molds & smuts, Morphine sulfate, Other, Penicillins, Reclast [zoledronic acid], Rofecoxib, Shrimp flavor, Tetracycline hcl, Xolair [omalizumab], Dilaudid [hydromorphone hcl],  Hydrocodone-acetaminophen, Levofloxacin, Oxycodone hcl, Paroxetine, Celecoxib, Diltiazem, Lactose intolerance (gi), and Tree extract   Social History   Tobacco Use  . Smoking status: Passive Smoke Exposure - Never Smoker  . Smokeless tobacco: Never Used  . Tobacco comment: Parents  Substance Use Topics  . Alcohol use: Not Currently    Alcohol/week: 0.0 standard drinks  . Drug use: No    Family History: The patient's family history includes Allergies in her father and mother; Arthritis in her father and mother; Colitis in her daughter; Colon cancer in an other family member; Diabetes in her maternal grandfather and mother; Heart disease in her father and mother; Lung cancer in her mother; Stroke in her father. Father had 4V CABG and valve replacement at age 65; had a complication with ruptured aorta after surgery and passed away. Mother had lung cancer and diabetes. No other significant heart disease or risks.   ROS:   Please see the history of present illness.  Additional pertinent ROS: Constitutional: Negative for chills, fever, night sweats, unintentional weight loss  HENT: Negative for ear pain and hearing loss.   Eyes: Negative for loss of vision and eye pain.  Respiratory: shortness of breath, as per HPI   Cardiovascular: See HPI. Gastrointestinal: Negative for abdominal pain, melena, and hematochezia.  Genitourinary: Negative for dysuria and hematuria.  Musculoskeletal: Negative for falls and myalgias.  Skin: Negative for itching and rash.  Neurological: Negative for focal weakness, focal sensory changes and loss of consciousness.  Endo/Heme/Allergies: Does not bruise/bleed easily.   EKGs/Labs/Other Studies Reviewed:    The following studies were reviewed today: Echo 08/05/17 Study Conclusions  - Left ventricle: The cavity size was normal. There was mild focal   basal hypertrophy of the septum. Systolic function was vigorous.   The estimated ejection fraction was in the  range of 65% to 70%.   Wall motion was normal; there were no regional wall motion   abnormalities. Doppler parameters are consistent with abnormal   left ventricular relaxation (grade 1 diastolic dysfunction).  Impressions:  - Definity used; vigorous LV systolic function; mild diastolic   dysfunction.  CT chest high resolution 6.17.19 FINDINGS: Cardiovascular: Heart size is normal. There is no significant pericardial fluid, thickening or pericardial calcification. There is aortic atherosclerosis, as well as atherosclerosis of the great vessels of the mediastinum and the coronary arteries,  including calcified atherosclerotic plaque in the left main, left anterior descending left circumflex coronary arteries.  IMPRESSION: 1. No findings to suggest interstitial lung disease. 2. Mild air trapping indicative of mild small airways disease. 3. Aortic atherosclerosis, in addition to left main and 2 vessel coronary artery disease. Please note that although the presence of coronary artery calcium documents the presence of coronary artery disease, the severity of this disease and any potential stenosis cannot be assessed on this non-gated CT examination. Assessment for potential risk factor modification, dietary therapy or pharmacologic therapy may be warranted, if clinically indicated. 4. Additional incidental findings, as above.  Stress 2011:  Stress echo 11/2009 Study Conclusions - Stress ECG conclusions: There were no stress arrhythmias or conduction abnormalities. The stress ECG was normal. - Staged echo: There was no echocardiographic evidence for stress-induced ischemia. Impressions: - Nondiagnostic stress echocardiogram with nochest pain, no ST changes; patient did not achieve target heart rate; no stress-induced wall motion abnormalities.  Had [place] 12/2009 due to nondiagnostic stress echo: Stress Procedure  The patient received IV Lexiscan 0.4 mg over 15-seconds with  concurrent low level exercise and then Technetium 18m Tetrofosmin was injected at 30-seconds while the patient continued walking one more minute.  There were no significant changes with Lexiscan.  Quantitative spect images were obtained after a 45 minute delay.  QPS Raw Data Images:  Normal; no motion artifact; normal heart/lung ratio. Stress Images:  Normal homogeneous uptake in all areas of the myocardium. Rest Images:  Normal homogeneous uptake in all areas of the myocardium. Subtraction (SDS):  No evidence of ischemia. Transient Ischemic Dilatation:  1.15  (Normal <1.22)  Lung/Heart Ratio:  .27  (Normal <0.45)  Quantitative Gated Spect Images QGS EDV:  52 ml QGS ESV:  13 ml QGS EF:  75 % QGS cine images:  Normal motion  Findings Normal nuclear study  EKG:  EKG is personally reviewed today.  The ekg ordered today demonstrates normal sinus rhythm, lack of anterior forces V1/V2  Recent Labs: 03/06/2018: B Natriuretic Peptide 51.7 03/27/2018: TSH 1.50 07/04/2018: ALT 24 07/29/2018: BUN 28; Creatinine, Ser 1.21; Hemoglobin 12.1; Platelets 248.0; Potassium 4.2; Pro B Natriuretic peptide (BNP) 53.0; Sodium 141  Recent Lipid Panel    Component Value Date/Time   CHOL 192 07/04/2018 1017   TRIG 212 (H) 07/04/2018 1017   HDL 84 07/04/2018 1017   CHOLHDL 2.3 07/04/2018 1017   VLDL 27 08/20/2016 1103   LDLCALC 77 07/04/2018 1017    Physical Exam:    VS:  BP 128/75   Pulse 74   Temp (!) 97.5 F (36.4 C)   Ht 5\' 1"  (1.549 m)   Wt 149 lb 3.2 oz (67.7 kg)   SpO2 96%   BMI 28.19 kg/m     Wt Readings from Last 3 Encounters:  02/26/19 149 lb 3.2 oz (67.7 kg)  12/17/18 149 lb (67.6 kg)  12/10/18 148 lb (67.1 kg)    GEN: Well nourished, well developed in no acute distress HEENT: Normal, moist mucous membranes NECK: No JVD CARDIAC: regular rhythm, normal S1 and S2, no rubs or gallops. No murmur. VASCULAR: Radial and DP pulses 2+ bilaterally. No carotid bruits RESPIRATORY:  Clear  to auscultation without rales, wheezing or rhonchi  ABDOMEN: Soft, non-tender, non-distended MUSCULOSKELETAL:  Ambulates independently SKIN: Warm and dry, no edema NEUROLOGIC:  Alert and oriented x 3. No focal neuro deficits noted. PSYCHIATRIC:  Normal affect   ASSESSMENT:    1. Chronic diastolic CHF (congestive heart failure) (  [place])   2. Coronary artery calcification seen on CT scan   3. SOB (shortness of breath)   4. Cardiac risk counseling   5. Counseling on health promotion and disease prevention    PLAN:    Coronary calcium seen on CT scan: suggests CAD equivalent -asymptomatic -prior negative nuclear stress, echo without WMA -counseled on prevention, below  Hypercholesterolemia with goal LDL <70, hypertriglyceridemia (unknown if fasting( -with recent restart of atorvastatin, LDL much improved, near goal -recent triglycerides 212, unknown if fasting -continue atorvastatin -LFTs normal 12/2018 per [facility]  Shortness of breath: there may be a small component of chronic diastolic heart failure, she appears euvolemic on exam. Following with multiple specialists  Secondary prevention: -recommend heart healthy/Mediterranean diet, with whole grains, fruits, vegetable, fish, lean meats, nuts, and olive oil. Limit salt. -recommend moderate walking, 3-5 times/week for 30-50 minutes each session. Aim for at least 150 minutes.week. Goal should be pace of 3 miles/hours, or walking 1.5 miles in 30 minutes -recommend avoidance of tobacco products. Avoid excess alcohol. -Additional risk factor control:  -Diabetes: no history  -Lipids: as above  -Blood pressure control: at goal  -Weight: BMI 28 -no aspirin given plans for long term anticoagulation  Plan for follow up: 1 year or sooner PRN  Total time of encounter: 35 minutes total time of encounter, including 25 minutes spent in face-to-face patient care. This time includes coordination of care and counseling regarding CV risk and  management. Remainder of non-face-to-face time involved reviewing chart documents/testing relevant to the patient encounter and documentation in the medical record.  Buford Dresser, MD, PhD [place]  [facility]   Medication Adjustments/Labs and Tests Ordered: Current medicines are reviewed at length with the patient today.  Concerns regarding medicines are outlined above.  Orders Placed This Encounter  Procedures  . EKG 12/Charge capture   No orders of the defined types were placed in this encounter.   Patient Instructions  Medication Instructions:  Your Physician recommend you continue on your current medication as directed.    *If you need a refill on your cardiac medications before your next appointment, please call your pharmacy*  Lab Work: None  Testing/Procedures: None  Follow-Up: At [facility], you and your health needs are our priority.  As part of our continuing mission to provide you with exceptional heart care, we have created designated Provider Care Teams.  These Care Teams include your primary Cardiologist (physician) and Advanced Practice Providers (APPs -  Physician Assistants and Nurse Practitioners) who all work together to provide you with the care you need, when you need it.  Your next appointment:   1 year(s)  The format for your next appointment:   In Person  Provider:   Buford Dresser, MD       Signed, Buford Dresser, MD PhD 02/26/2019   [place]

## 2019-02-26 NOTE — Telephone Encounter (Signed)
I placed the order for a nebulizer to be sent to another DME. Dr. Carlis Abbott please advise if you received the information from the sleep physician in regards to this pt.

## 2019-02-27 ENCOUNTER — Telehealth: Payer: Self-pay | Admitting: Pulmonary Disease

## 2019-02-27 DIAGNOSIS — J471 Bronchiectasis with (acute) exacerbation: Secondary | ICD-10-CM | POA: Diagnosis not present

## 2019-02-27 DIAGNOSIS — G4733 Obstructive sleep apnea (adult) (pediatric): Secondary | ICD-10-CM

## 2019-02-27 NOTE — Telephone Encounter (Signed)
Ebony Scott, I had asked about a download from the machine but I do not remember receiving one or reviewing one on her. -She did not have significant sleep apnea on home sleep study, she did have some desaturations -I believe her CPAP was obtained and is being used to provide positive airway pressure during sleep-to stent open her airway -Not expecting any significant finding from the download, however, oxygen oxygenationation may need optimized   To the triage pool .  Can we get a download from Ebony Scott CPAP .  Overnight oximetry on the CPAP should be requested if we do not have one already .  I believe she uses [employer] based on previous conversations on her record

## 2019-03-01 ENCOUNTER — Encounter: Payer: Self-pay | Admitting: Cardiology

## 2019-03-01 DIAGNOSIS — G4733 Obstructive sleep apnea (adult) (pediatric): Secondary | ICD-10-CM | POA: Diagnosis not present

## 2019-03-02 NOTE — Telephone Encounter (Signed)
I have printed DL's for review and placed in Dr Judson Roch and Dr. Ainsley Spinner lookat folders  Spoke with the pt and notified that we are going to order ONO on CPAP  She verbalized understanding  Order sent to [facility]

## 2019-03-04 ENCOUNTER — Telehealth: Payer: Self-pay | Admitting: Pulmonary Disease

## 2019-03-04 IMAGING — CR DG FOOT COMPLETE 3+V*L*
3 series · 3 of 3 positions shown · non-contrast
Comparison: None.

CLINICAL DATA: Injury Kaki he left foot 07/31/16 after hitting
lateral side of foot on a box, pain is at the 5th MT, evalaute for
fx (tech to call report)

EXAM:
LEFT FOOT - COMPLETE 3+ VIEW

[t foot ap left]
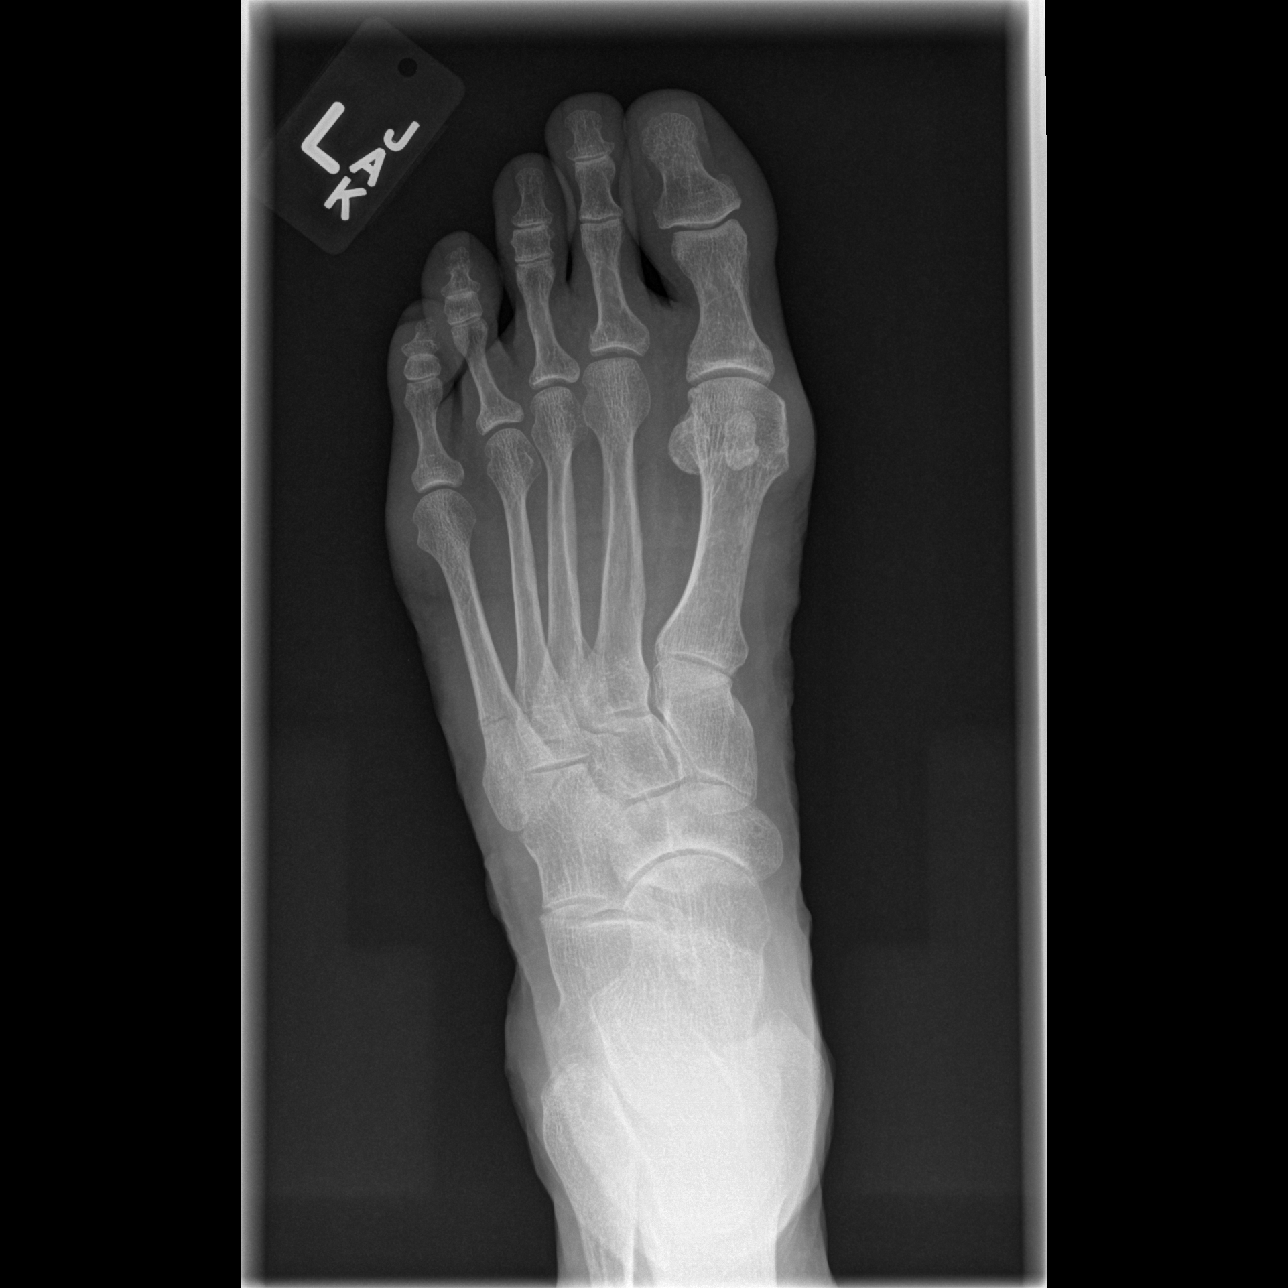

[t foot oblique left]
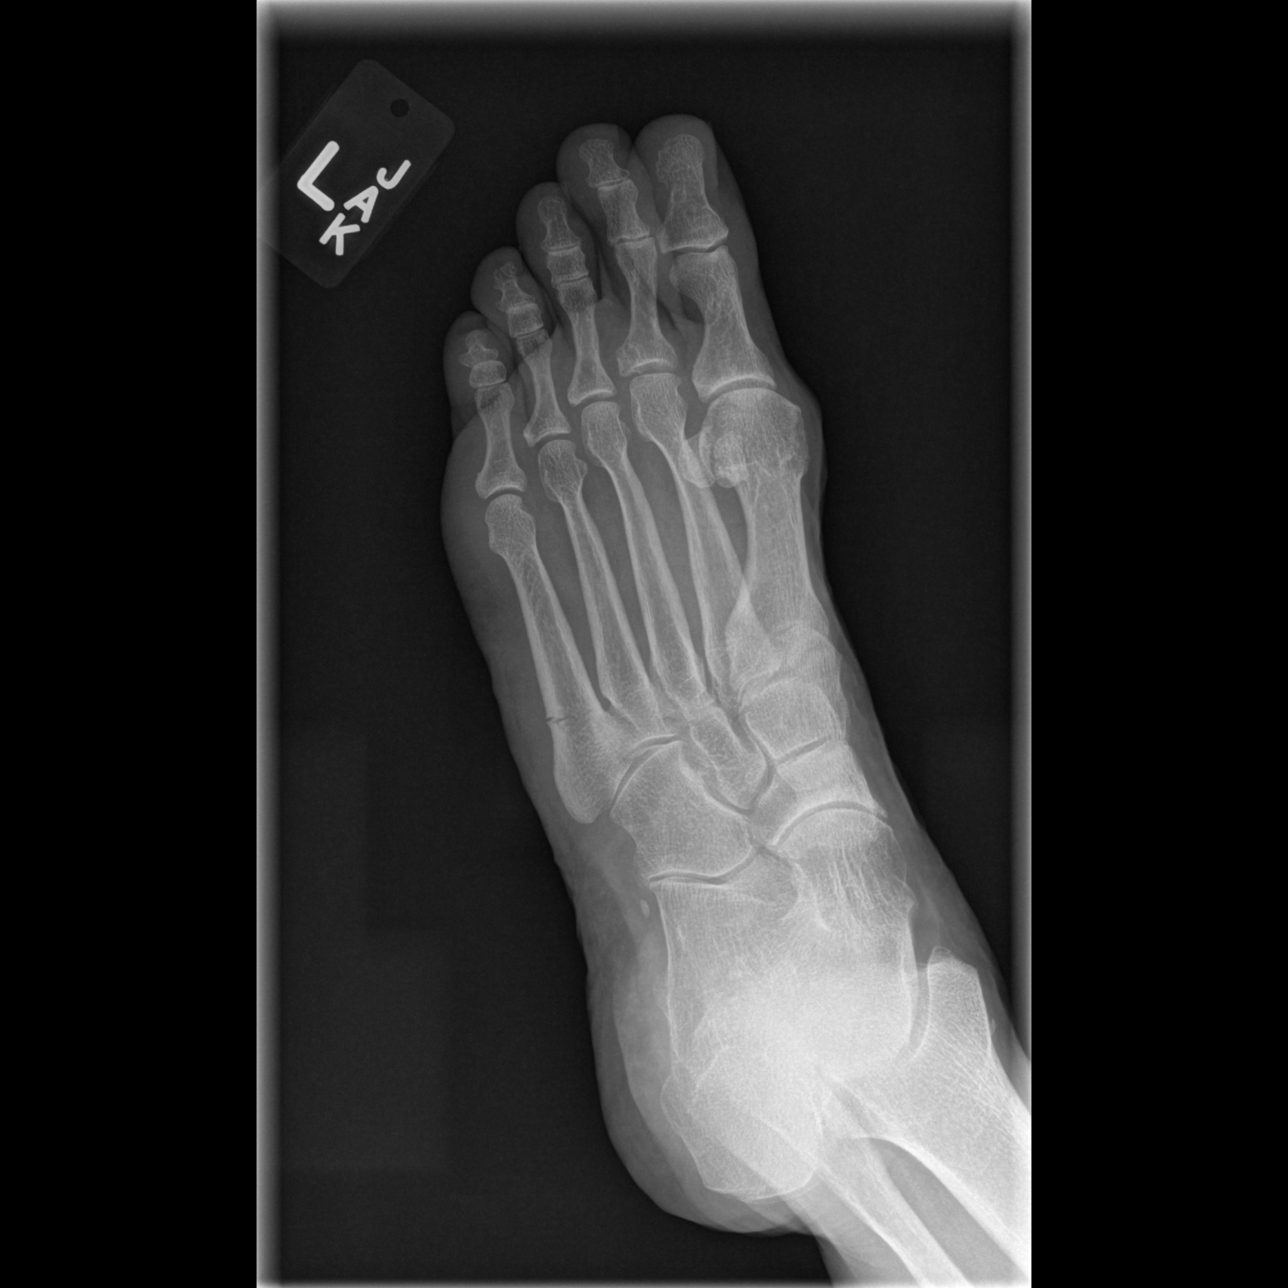

[t foot lat left]
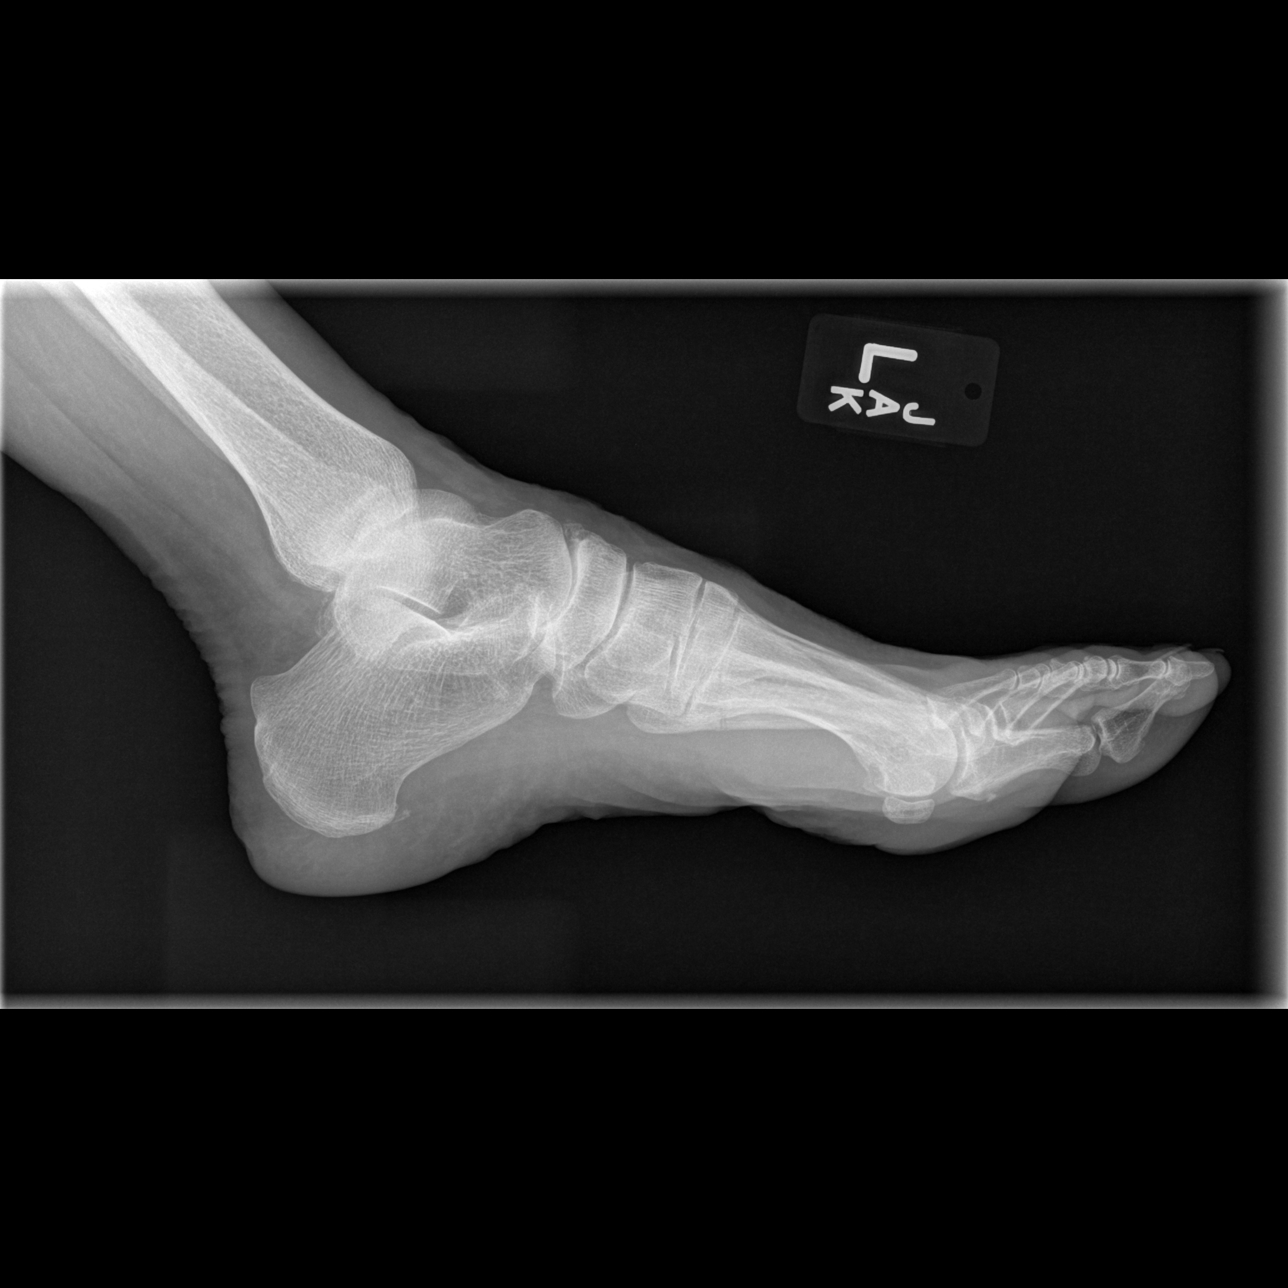

[3 of 3 positions shown; findings below may reference images not displayed]

FINDINGS: Nondisplaced fracture of the proximal fifth metatarsal bone.

No other fracture. No significant degenerative change. Soft tissues
about the left foot are unremarkable.
IMPRESSION: Nondisplaced fracture within the proximal fifth metatarsal bone, by
location consistent with Jones fracture.

## 2019-03-04 NOTE — Telephone Encounter (Signed)
Called and spoke with Patient.  Dr. Olalere's results and recommendations given.  Understanding stated.  Nothing further at this time. 

## 2019-03-04 NOTE — Telephone Encounter (Signed)
Inform patient  Compliance data reviewed  100% compliance 6 hours 7 minutes on average  Pressure settings between 5-10 Occasional mask  Leak  AHI is 4.6-this means if there is presence of obstructive sleep apnea, it is been well treated -She did not have significant sleep disordered breathing from her recent sleep study  We will follow-up on the nocturnal oximetry that was ordered No changes need to be made to the machine at present

## 2019-03-05 NOTE — Telephone Encounter (Signed)
Patient had patient [employer] patient assistance and it was mailed. She has not heard anything and we havent either. This paperwork was mailed out once it was signed by Dr. Carlis Abbott.  Patient has been set up with [facility] for the oximeter testing this week wants PC to know

## 2019-03-05 NOTE — Telephone Encounter (Signed)
Thanks for letting me know. I will be on the lookout for results. If there is anything else I need to sign please let me know.  Julian Hy, DO 03/05/19 4:16 PM [facility]

## 2019-03-09 ENCOUNTER — Other Ambulatory Visit: Payer: Self-pay | Admitting: Family Medicine

## 2019-03-10 ENCOUNTER — Ambulatory Visit (INDEPENDENT_AMBULATORY_CARE_PROVIDER_SITE_OTHER): Payer: Medicare PPO

## 2019-03-10 ENCOUNTER — Ambulatory Visit: Payer: Medicare PPO | Admitting: Podiatry

## 2019-03-10 ENCOUNTER — Other Ambulatory Visit: Payer: Self-pay

## 2019-03-10 DIAGNOSIS — L84 Corns and callosities: Secondary | ICD-10-CM | POA: Diagnosis not present

## 2019-03-10 DIAGNOSIS — M2061 Acquired deformities of toe(s), unspecified, right foot: Secondary | ICD-10-CM

## 2019-03-10 DIAGNOSIS — S92301D Fracture of unspecified metatarsal bone(s), right foot, subsequent encounter for fracture with routine healing: Secondary | ICD-10-CM | POA: Diagnosis not present

## 2019-03-11 ENCOUNTER — Encounter: Payer: Self-pay | Admitting: Family Medicine

## 2019-03-12 ENCOUNTER — Ambulatory Visit: Payer: Medicare PPO | Attending: Internal Medicine

## 2019-03-12 ENCOUNTER — Telehealth: Payer: Self-pay | Admitting: *Deleted

## 2019-03-12 DIAGNOSIS — Z23 Encounter for immunization: Secondary | ICD-10-CM | POA: Insufficient documentation

## 2019-03-12 NOTE — Telephone Encounter (Signed)
Received Benefit Verification from [facility] for [place].   PA required for [facility].   Dx: M81.0- osteoporosis Procedure Code: J 0897 10/01/2018- T score -1.6  Documented allergy to Reclast. Bisphosphonate contraindicated D/T GERD/Laryngopharyngeal reflux disease.    PA submitted via telephone. Ref #: [id number].  Determination expected in 1-3 business days.

## 2019-03-12 NOTE — Progress Notes (Signed)
   Covid-19 Vaccination Clinic  Name:  Ebony Scott    MRN: [id number] DOB: [birth year]  03/12/2019  Ebony Scott was observed post Covid-19 immunization for 30 minutes based on pre-vaccination screening without incidence. She was provided with Vaccine Information Sheet and instruction to access the V-Safe system.   Ebony Scott was instructed to call 911 with any severe reactions post vaccine: Marland Kitchen Difficulty breathing  . Swelling of your face and throat  . A fast heartbeat  . A bad rash all over your body  . Dizziness and weakness    Immunizations Administered    Name Date Dose VIS Date Route   Pfizer COVID-19 Vaccine 03/12/2019 10:02 AM 0.3 mL 01/30/2019 Intramuscular   Manufacturer: [employer]   Lot: [id number]   Annex: [id number]

## 2019-03-15 NOTE — Progress Notes (Signed)
Subjective: 69 year old female presents the office for follow-up evaluation of fifth metatarsal fracture of the right foot.  She states that she is doing well.  She has an occasional discomfort but no significant pain.  She wears the cam boot at home which she would like a regular shoe today as she has to drive.  The second toe is doing well she not had any significant pain but she is wearing a toe separator. Denies any systemic complaints such as fevers, chills, nausea, vomiting. No acute changes since last appointment, and no other complaints at this time.   Objective: AAO x3, NAD DP/PT pulses palpable bilaterally, CRT less than 3 seconds No significant discomfort to palpation of the right foot on the fifth metatarsal.  There is minimal edema there is no erythema or warmth.  No open lesions or pre-ulcerative lesions.  No significant hyperkeratotic tissue at the second digit.  No pain of the second toe No pain with calf compression, swelling, warmth, erythema  Assessment: 69 year old female right fifth metatarsal fracture  Plan: -All treatment options discussed with the patient including all alternatives, risks, complications.  -Repeat x-rays obtained reviewed.  Radiolucent line still evident along the fifth metatarsal however there does show some increased consolidation across the site.  No evidence of acute fracture. -On her continue the cam boot for now.  Discussed range of motion exercises.  Continue to ice the area as well. -Patient encouraged to call the office with any questions, concerns, change in symptoms.   Trula Slade DPM

## 2019-03-17 ENCOUNTER — Ambulatory Visit: Payer: Medicare PPO | Admitting: Allergy and Immunology

## 2019-03-17 ENCOUNTER — Other Ambulatory Visit: Payer: Self-pay

## 2019-03-17 ENCOUNTER — Encounter: Payer: Self-pay | Admitting: Allergy and Immunology

## 2019-03-17 VITALS — BP 152/68 | HR 80 | Temp 97.4°F | Resp 22 | Ht 61.0 in | Wt 150.8 lb

## 2019-03-17 DIAGNOSIS — J4550 Severe persistent asthma, uncomplicated: Secondary | ICD-10-CM

## 2019-03-17 DIAGNOSIS — G500 Trigeminal neuralgia: Secondary | ICD-10-CM | POA: Diagnosis not present

## 2019-03-17 DIAGNOSIS — G478 Other sleep disorders: Secondary | ICD-10-CM | POA: Diagnosis not present

## 2019-03-17 DIAGNOSIS — K219 Gastro-esophageal reflux disease without esophagitis: Secondary | ICD-10-CM | POA: Diagnosis not present

## 2019-03-17 DIAGNOSIS — D849 Immunodeficiency, unspecified: Secondary | ICD-10-CM

## 2019-03-17 DIAGNOSIS — J3089 Other allergic rhinitis: Secondary | ICD-10-CM

## 2019-03-17 IMAGING — DX DG CHEST 2V
2 series · 2 of 2 positions shown · non-contrast
Comparison: May 30, 2016

CLINICAL DATA: Progressive cough. Recent bronchoscopy. History of
breast carcinoma

EXAM:
CHEST  2 VIEW

[chest pa]
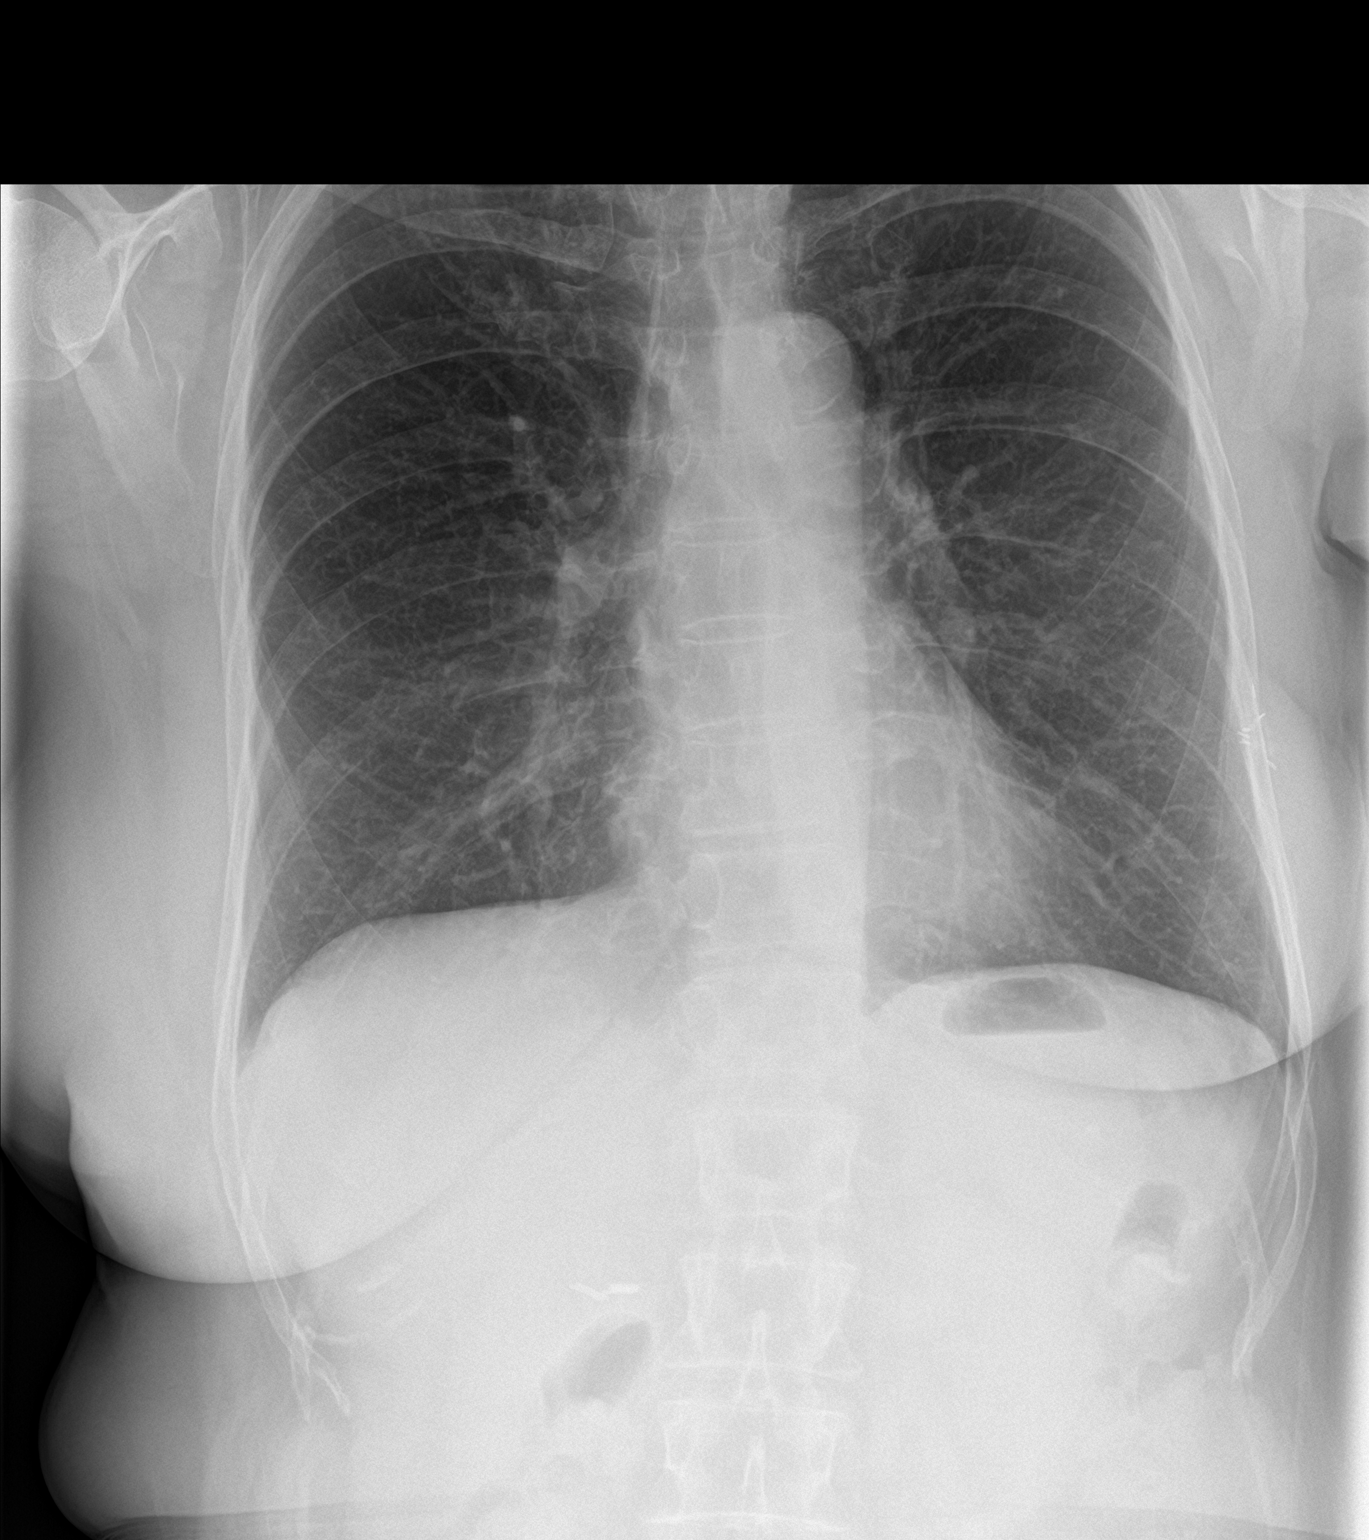

[chest lat]
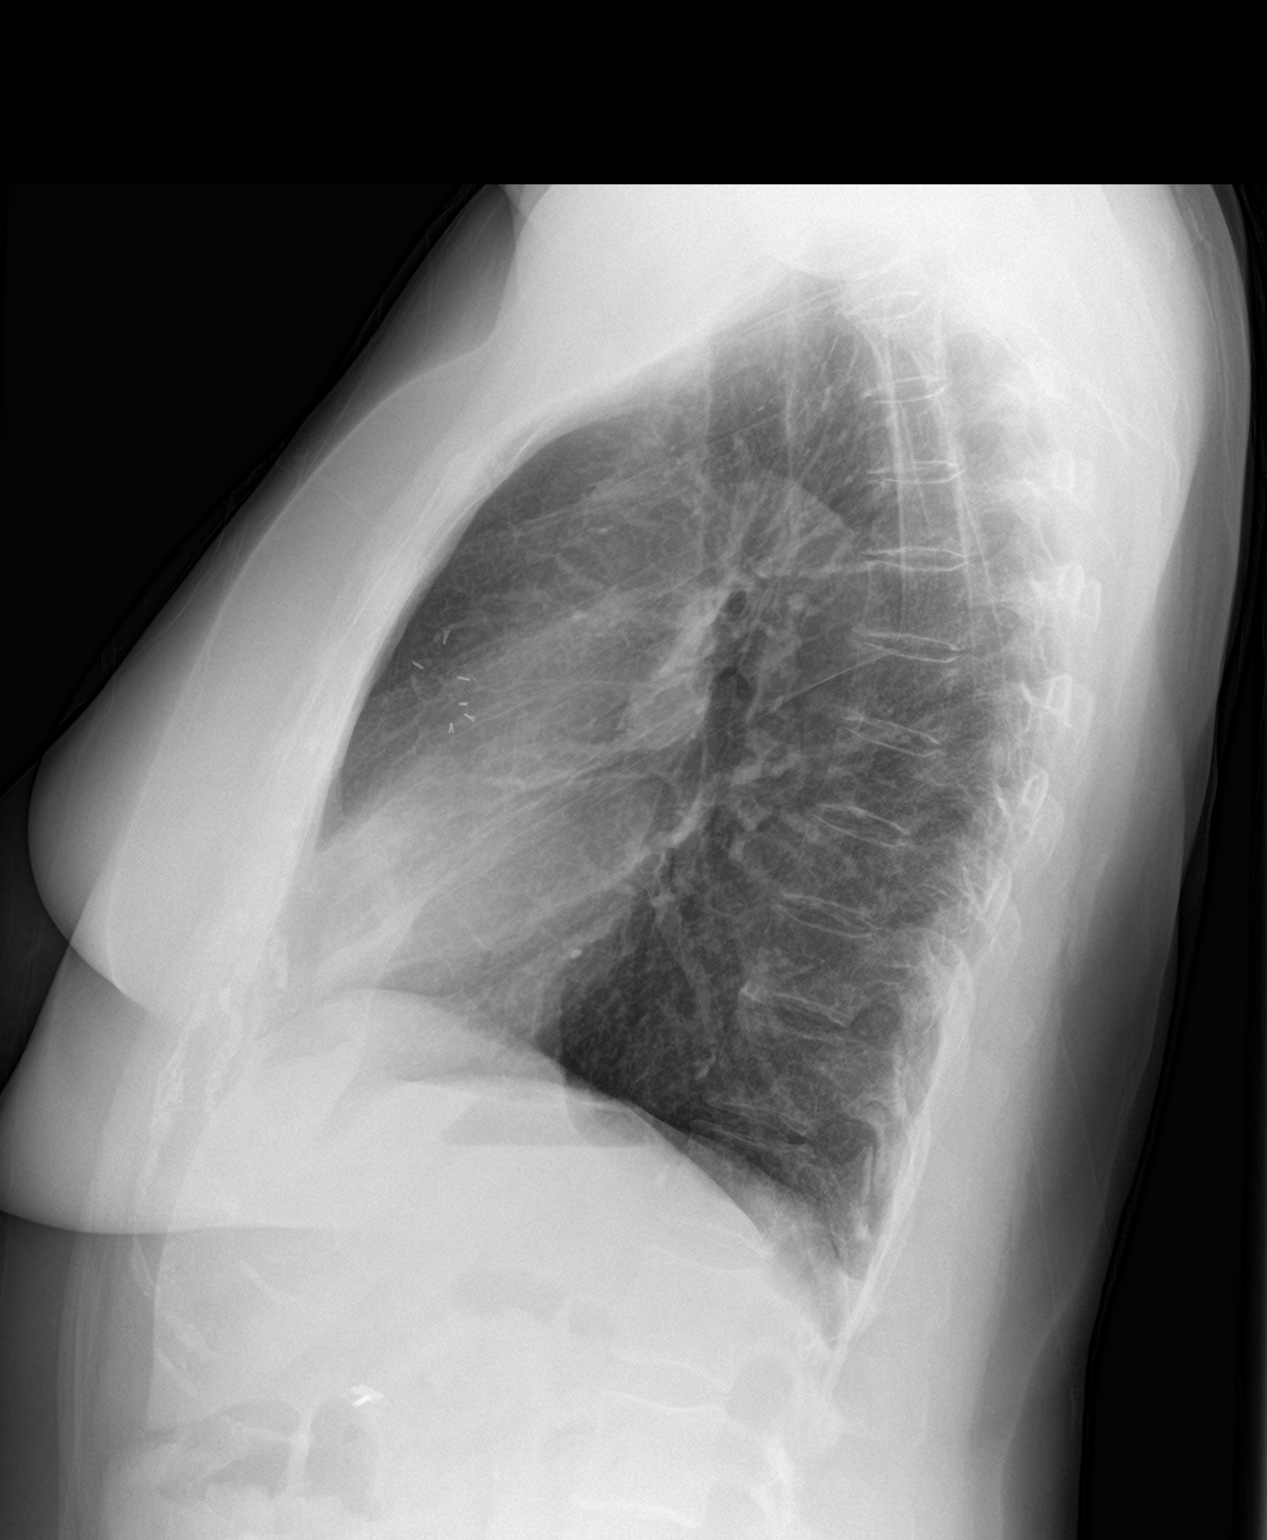

[2 of 2 positions shown; findings below may reference images not displayed]

FINDINGS: There is no appreciable edema or consolidation. The heart size and
pulmonary vascularity are normal. No adenopathy. There is aortic
atherosclerosis. Postoperative changes noted in the region of the
left breast with surgical clips in the left breast region.
IMPRESSION: No edema or consolidation. Stable cardiac silhouette. There is
aortic atherosclerosis. No evident adenopathy.

Aortic Atherosclerosis ([id number]).

## 2019-03-17 MED ORDER — NYSTATIN 100000 UNIT/ML MT SUSP: OROMUCOSAL | 3 refills | Status: DC

## 2019-03-17 NOTE — Telephone Encounter (Signed)
Received PA determination.   PA [id number] approved 03/12/2019- 02/19/2020.

## 2019-03-17 NOTE — Patient Instructions (Addendum)
  1. 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 Periactin 4 mg tablet  2 tablets at bedtime  2. 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 Dexilant 60 mg in the morning  B. famotidine 40 mg daily  4.  Use Astelin and Zyrtec and nasal saline and nasal bactroban if needed  5.  Continue therapy directed by pulmonology and rheumatology  6.  Return to clinic in 6 months or earlier if problem

## 2019-03-17 NOTE — Progress Notes (Signed)
- [place] [facility] [place]   Follow-up Note  Referring Provider: [name], MD Primary Provider: [name], MD Date of Office Visit: 03/17/2019  Subjective:   Ebony Scott (DOB: 04/03/1950) is a 69 y.o. female who returns to the [facility] [place] on 03/17/2019 in re-evaluation of the following:  HPI: Ebony Scott returns to this clinic in evaluation of allergic rhinoconjunctivitis and facial pain syndrome and sleep dysfunction and reflux, and a history of asthma / tracheobronchial malacia / bronchiectasis followed by pulmonology, and immunosuppression for rheumatoid arthritis followed by rheumatology.  I have not seen her in this clinic since 09 September 2018.  Concerning her facial pain syndrome she is doing very well and does not really have any complaints as long she continues to use Periactin.  Periactin also appears to help with her sleep dysfunction.  She has had very little problems with her nose recently.  It does not sound as though she has had an episode of sinusitis and has required an antibiotic.  She intermittently uses nasal Bactroban for irritation of her nasal mucosa.  She believes that her reflux is under control.  She does have a history of esophageal dysfunction and she rarely has an obstructive swallowing event.  She informs me that she has continued to undergo therapy directed by pulmonology for her lower airway disease.  She is very dyspneic especially if she exerts herself to any degree.  She has been given a CPAP machine and she will be having a nocturnal oximetry study this week to check on her oxygenation while she sleeps.  Her rheumatoid arthritis is still an active issue and she has been treated with multiple immunosuppressive agents.  She is off her methotrexate and Orencia and will be starting golimumab in the near future.  Presently she is using 15 mg of prednisone daily.  She did receive the flu vaccine and she has  received her first Covid vaccine.  Her next Covid vaccine is 31 March 2018.  Allergies as of 03/17/2019      Reactions   Dust Mite Extract Shortness Of Breath, Other (See Comments)   "sneezing" (02/20/2012)   Molds & Smuts Shortness Of Breath   Morphine Sulfate Itching   Other Shortness Of Breath, Other (See Comments)   Grass and weeds "sneezing; filled sinuses" (02/20/2012)   Penicillins Rash, Other (See Comments)   "welts" (02/20/2012) Has patient had a PCN reaction causing immediate rash, facial/tongue/throat swelling, SOB or lightheadedness with hypotension: Unknown Has patient had a PCN reaction causing severe rash involving mucus membranes or skin necrosis: No Has patient had a PCN reaction that required hospitalization: No Has patient had a PCN reaction occurring within the last 10 years: No If all of the above answers are "NO", then may proceed with Cephalosporin use.   Reclast [zoledronic Acid] Other (See Comments)   Fever, Put in hospital, dr said it was a reaction from a reaction    Rofecoxib Swelling   Vioxx REACTION: feet swelling   Shrimp Flavor Anaphylaxis   ALL SHELLFISH   Tetracycline Hcl Nausea And Vomiting   Xolair [omalizumab] Other (See Comments)   Caused Blood clot   Dilaudid [hydromorphone Hcl] Itching   Hydrocodone-acetaminophen Nausea And Vomiting   Levofloxacin Other (See Comments)   REACTION: GI upset   Oxycodone Hcl Nausea And Vomiting   Paroxetine Nausea And Vomiting   Paxil   Celecoxib Swelling   Feet swelling   Diltiazem Swelling  Lactose Intolerance (gi) Other (See Comments)   Bloating and gas   Tree Extract Other (See Comments)   "tested and told I was allergic to it; never experienced a reaction to it" (02/20/2012)      Medication List    acetaminophen 650 MG CR tablet Commonly known as: TYLENOL Take 1,300 mg by mouth every 8 (eight) hours as needed for pain.   AeroChamber Plus inhaler Use as instructed   albuterol 108 (90 Base)  MCG/ACT inhaler Commonly known as: ProAir HFA Inhale 2 puffs into the lungs every 4 (four) hours as needed for wheezing or shortness of breath.   ALIGN PREBIOTIC-PROBIOTIC PO Take by mouth every evening.   Alpha-Lipoic Acid 600 MG Caps Take 600 mg by mouth daily.   alum & mag hydroxide-simeth 200-200-20 MG/5ML suspension Commonly known as: MAALOX/MYLANTA Take 15 mLs by mouth every 4 (four) hours as needed for indigestion or heartburn.   atorvastatin 10 MG tablet Commonly known as: LIPITOR Take 1 tablet (10 mg total) by mouth daily.   azelastine 0.1 % nasal spray Commonly known as: ASTELIN USE 2 SPRAYS IN EACH NOSTRIL DAILY AS DIRECTED   BEANO PO Take 1 capsule by mouth 3 (three) times daily as needed (gas/ bloating).   CALCIUM-MAGNESIUM PO Take 1 tablet by mouth 2 (two) times daily.   cetirizine 10 MG tablet Commonly known as: ZYRTEC Take 5 mg by mouth every evening.   cholecalciferol 25 MCG (1000 UNIT) tablet Commonly known as: VITAMIN D Take 1,000 Units by mouth daily.   cyproheptadine 4 MG tablet Commonly known as: PERIACTIN Take 2 tablets (8 mg total) by mouth every evening.   denosumab 60 MG/ML Soln injection Commonly known as: PROLIA Inject 60 mg into the skin every 6 (six) months. Administer in upper arm, thigh, or abdomen   Dexilant 60 MG capsule Generic drug: dexlansoprazole TAKE 1 CAPSULE BY MOUTH EACH MORNING   diclofenac Sodium 1 % Gel Commonly known as: Voltaren Apply 2 g topically 4 (four) times daily.   Dulera 200-5 MCG/ACT Aero Generic drug: mometasone-formoterol Inhale 2 puffs into the lungs 2 (two) times daily.   EPINEPHrine 0.3 mg/0.3 mL Soaj injection Commonly known as: EpiPen 2-Pak Inject 0.3 mLs (0.3 mg total) into the muscle Once PRN.   famotidine 20 MG tablet Commonly known as: Pepcid Take 1 tablet (20 mg total) by mouth 2 (two) times daily.   Flutter Devi Use as directed   gabapentin 600 MG tablet Commonly known as:  NEURONTIN Take 1 tablet (600 mg total) by mouth daily.   guaiFENesin 600 MG 12 hr tablet Commonly known as: MUCINEX Take by mouth 2 (two) times daily.   Lactaid Fast Act 9000 units Tabs Generic drug: Lactase Take 18,000 Units by mouth 4 (four) times daily as needed (dairy consumption).   leflunomide 20 MG tablet Commonly known as: ARAVA Take 20 mg by mouth daily.   lidocaine 5 % Commonly known as: LIDODERM Place 1 patch onto the skin daily as needed (pain). Remove & Discard patch within 12 hours or as directed by MD   mirtazapine 30 MG disintegrating tablet Commonly known as: REMERON SOL-TAB DISSOLVE 1 TABLET IN MOUTH EVERY NIGHT AT BEDTIME   mometasone 50 MCG/ACT nasal spray Commonly known as: NASONEX Place 2 sprays into the nose daily.   montelukast 10 MG tablet Commonly known as: SINGULAIR TAKE 1 CAPSULE BY MOUTH DAILY   mupirocin ointment 2 % Commonly known as: BACTROBAN Place 1 application into the nose at  bedtime. To prevent nose bleeds   nystatin 100000 UNIT/ML suspension Commonly known as: MYCOSTATIN TAKE 5 MLS BY MOUTH AS DIRECTED IN MOUTHOR THROAT 4 TIMES DAILY   nystatin-triamcinolone ointment Commonly known as: MYCOLOG Apply topically 2 (two) times daily.   predniSONE 5 MG tablet Commonly known as: DELTASONE 10 mg.   prenatal multivitamin Tabs tablet Take 1 tablet by mouth daily at 12 noon.   rivaroxaban 20 MG Tabs tablet Commonly known as: Xarelto TAKE 1 TABLET BY MOUTH DAILY WITH SUPPER   SIMPONI  Inject into the skin.   sodium chloride HYPERTONIC 3 % nebulizer solution Take by nebulization daily.   Systane Complete 0.6 % Soln Generic drug: Propylene Glycol Apply to eye.       Past Medical History:  Diagnosis Date  . Acute deep vein thrombosis (DVT) of right lower extremity ([place]) 12/13/2017  . Allergic rhinitis   . Allergy    SEASONAL  . Anemia   . Anxiety    pt denies  . Asthma   . Breast cancer ([place]) 1998   in remission   . Cataract    REMOVED  . Clostridium difficile colitis 01/14/2018  . Clostridium difficile diarrhea 01/14/2018  . Complication of anesthesia    "had hard time waking up from it several times" (02/20/2012)  . Depression    "some; don't take anything for it" (02/20/2012)  . Diverticulosis   . DVT (deep venous thrombosis) ([place])   . Exertional dyspnea   . Fibromyalgia 11/2011  . GERD (gastroesophageal reflux disease)   . Graves disease   . Headache(784.0)    "related to allergies; more at different times during the year" (02/20/2012)  . Hemorrhoids   . Hiatal hernia    back and neck  . Hx of adenomatous colonic polyps 04/12/2016  . Hypercholesteremia    good cholesterol is high  . Hypothyroidism   . IBS (irritable bowel syndrome)   . Moderate persistent asthma    -FeV1 72% 2011, -IgE 102 2011, CT sinus Neg 2011  . Osteoporosis    on reclast yearly  . Pneumonia 04/2011; ~ 11/2011   "double; single" (02/20/2012)  . Seronegative rheumatoid arthritis ([place])    Dr. Lahoma Rocker  . SIRS (systemic inflammatory response syndrome) ([place]) 02/10/2018  . Tracheobronchomalacia     Past Surgical History:  Procedure Laterality Date  . ANTERIOR AND POSTERIOR REPAIR  1990's  . APPENDECTOMY    . BREAST LUMPECTOMY  1998   left  . CARPOMETACARPEL ([place]) FUSION OF THUMB WITH AUTOGRAFT FROM RADIUS  ~ 2009   "both thumbs" (02/20/2012)  . CATARACT EXTRACTION W/ INTRAOCULAR LENS  IMPLANT, BILATERAL  2012  . CERVICAL DISCECTOMY  10/2001   C5-C6  . CERVICAL FUSION  2003   C3-C4  . CHOLECYSTECTOMY    . COLONOSCOPY    . DEBRIDEMENT TENNIS ELBOW  ?1970's   right  . ESOPHAGOGASTRODUODENOSCOPY    . HYSTERECTOMY    . KNEE ARTHROPLASTY  ?1990's   "?right; w/cartilage repair" (02/20/2012)  . NASAL SEPTUM SURGERY  1980's  . POSTERIOR CERVICAL FUSION/FORAMINOTOMY  2004   "failed initial fusion; rewired  anterior neck" (02/20/2012)  . TONSILLECTOMY  ~ 1953  . VESICOVAGINAL FISTULA CLOSURE W/ TAH  1988  . VIDEO  BRONCHOSCOPY Bilateral 08/23/2016   Procedure: VIDEO BRONCHOSCOPY WITH FLUORO;  Surgeon: Javier Glazier, MD;  Location: [facility];  Service: Cardiopulmonary;  Laterality: Bilateral;    Review of systems negative except as noted in HPI / PMHx or  noted below:  Review of Systems  Constitutional: Negative.   HENT: Negative.   Eyes: Negative.   Respiratory: Negative.   Cardiovascular: Negative.   Gastrointestinal: Negative.   Genitourinary: Negative.   Musculoskeletal: Negative.   Skin: Negative.   Neurological: Negative.   Endo/Heme/Allergies: Negative.   Psychiatric/Behavioral: Negative.      Objective:   Vitals:   03/17/19 1050  BP: (!) 152/68  Pulse: 80  Resp: (!) 22  Temp: (!) 97.4 F (36.3 C)  SpO2: 95%   Height: 5\' 1"  (154.9 cm)  Weight: 150 lb 12.8 oz (68.4 kg)   Physical Exam Constitutional:      Appearance: She is not diaphoretic.     Comments: Moon face, buffalo hump  HENT:     Head: Normocephalic.     Right Ear: Tympanic membrane, ear canal and external ear normal.     Left Ear: Tympanic membrane, ear canal and external ear normal.     Nose: Nose normal. No mucosal edema or rhinorrhea.     Mouth/Throat:     Pharynx: Uvula midline. No oropharyngeal exudate.  Eyes:     Conjunctiva/sclera: Conjunctivae normal.  Neck:     Thyroid: No thyromegaly.     Trachea: Trachea normal. No tracheal tenderness or tracheal deviation.  Cardiovascular:     Rate and Rhythm: Normal rate and regular rhythm.     Heart sounds: Normal heart sounds, S1 normal and S2 normal. No murmur.  Pulmonary:     Effort: No respiratory distress.     Breath sounds: Normal breath sounds. No stridor. No wheezing or rales.  Lymphadenopathy:     Head:     Right side of head: No tonsillar adenopathy.     Left side of head: No tonsillar adenopathy.     Cervical: No cervical adenopathy.  Skin:    Findings: No erythema or rash.     Nails: There is no clubbing.  Neurological:     Mental  Status: She is alert.     Diagnostics:    Spirometry was performed and demonstrated an FEV1 of 1.39 at 71 % of predicted.  Assessment and Plan:   1. Facial pain syndrome   2. Other allergic rhinitis   3. Sleep dysfunction with sleep stage disturbance   4. LPRD (laryngopharyngeal reflux disease)   5. Not well controlled severe persistent asthma   6. Immunosuppression ([place])     1. 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 Periactin 4 mg tablet  2 tablets at bedtime  2. 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 Dexilant 60 mg in the morning  B. famotidine 40 mg daily  4.  Use Astelin and Zyrtec and nasal saline and nasal bactroban if needed  5.  Continue therapy directed by pulmonology and rheumatology  6.  Return to clinic in 6 months or earlier if problem   Alivia appears to be doing okay with her facial pain syndrome and sleep dysfunction and LPR and upper airway disease on her current medical therapy.  I see no need for changing these medications at this point.  She will continue to be followed by pulmonology and rheumatology for her other respiratory tract issues and autoimmune disease.  I will see her back in this clinic in 6 months or earlier if there is a problem.  Allena Katz, MD Allergy / Immunology [place]

## 2019-03-18 ENCOUNTER — Telehealth: Payer: Self-pay | Admitting: Allergy and Immunology

## 2019-03-18 ENCOUNTER — Encounter: Payer: Self-pay | Admitting: Pulmonary Disease

## 2019-03-18 ENCOUNTER — Ambulatory Visit: Payer: Medicare Other

## 2019-03-18 ENCOUNTER — Encounter: Payer: Self-pay | Admitting: Allergy and Immunology

## 2019-03-18 DIAGNOSIS — R0902 Hypoxemia: Secondary | ICD-10-CM | POA: Diagnosis not present

## 2019-03-18 DIAGNOSIS — J449 Chronic obstructive pulmonary disease, unspecified: Secondary | ICD-10-CM | POA: Diagnosis not present

## 2019-03-18 MED ORDER — NYSTATIN 100000 UNIT/ML MT SUSP: OROMUCOSAL | 3 refills | Status: DC

## 2019-03-18 MED FILL — NYSTATIN 100,000 UNITS/ML S: 100000 | 30 days supply | Qty: 300 | Fill #0

## 2019-03-18 NOTE — Telephone Encounter (Addendum)
Call to patient.  Prescription was sent to [facility].  Re-sent to [facility], call ended, pt aware.

## 2019-03-18 NOTE — Telephone Encounter (Signed)
Pt called and said that her nystatin was not sent into [facility] yesterday. [phone]

## 2019-03-19 DIAGNOSIS — M0609 Rheumatoid arthritis without rheumatoid factor, multiple sites: Secondary | ICD-10-CM | POA: Diagnosis not present

## 2019-03-20 DIAGNOSIS — J4550 Severe persistent asthma, uncomplicated: Secondary | ICD-10-CM

## 2019-03-20 DIAGNOSIS — J3089 Other allergic rhinitis: Secondary | ICD-10-CM

## 2019-03-22 IMAGING — DX DG FOOT COMPLETE 3+V*L*
3 series · 3 of 3 positions shown · non-contrast
Comparison: 08/17/2016 left foot radiograph

CLINICAL DATA: Follow-up left foot fracture

EXAM:
LEFT FOOT - COMPLETE 3+ VIEW

[dg foot complete left (1 of 3)]
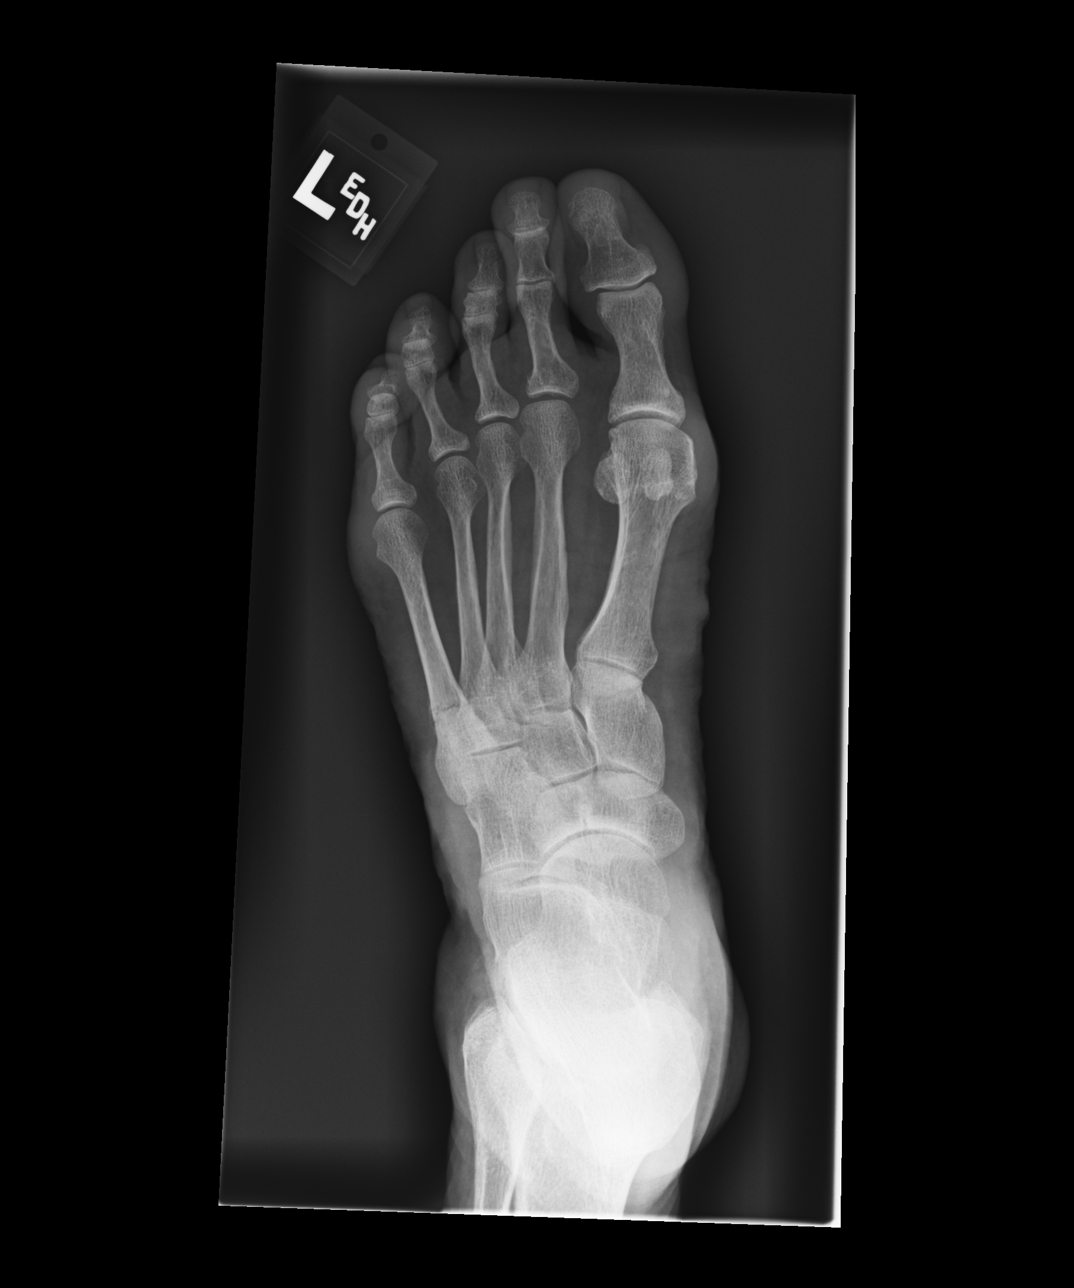

[dg foot complete left (2 of 3)]
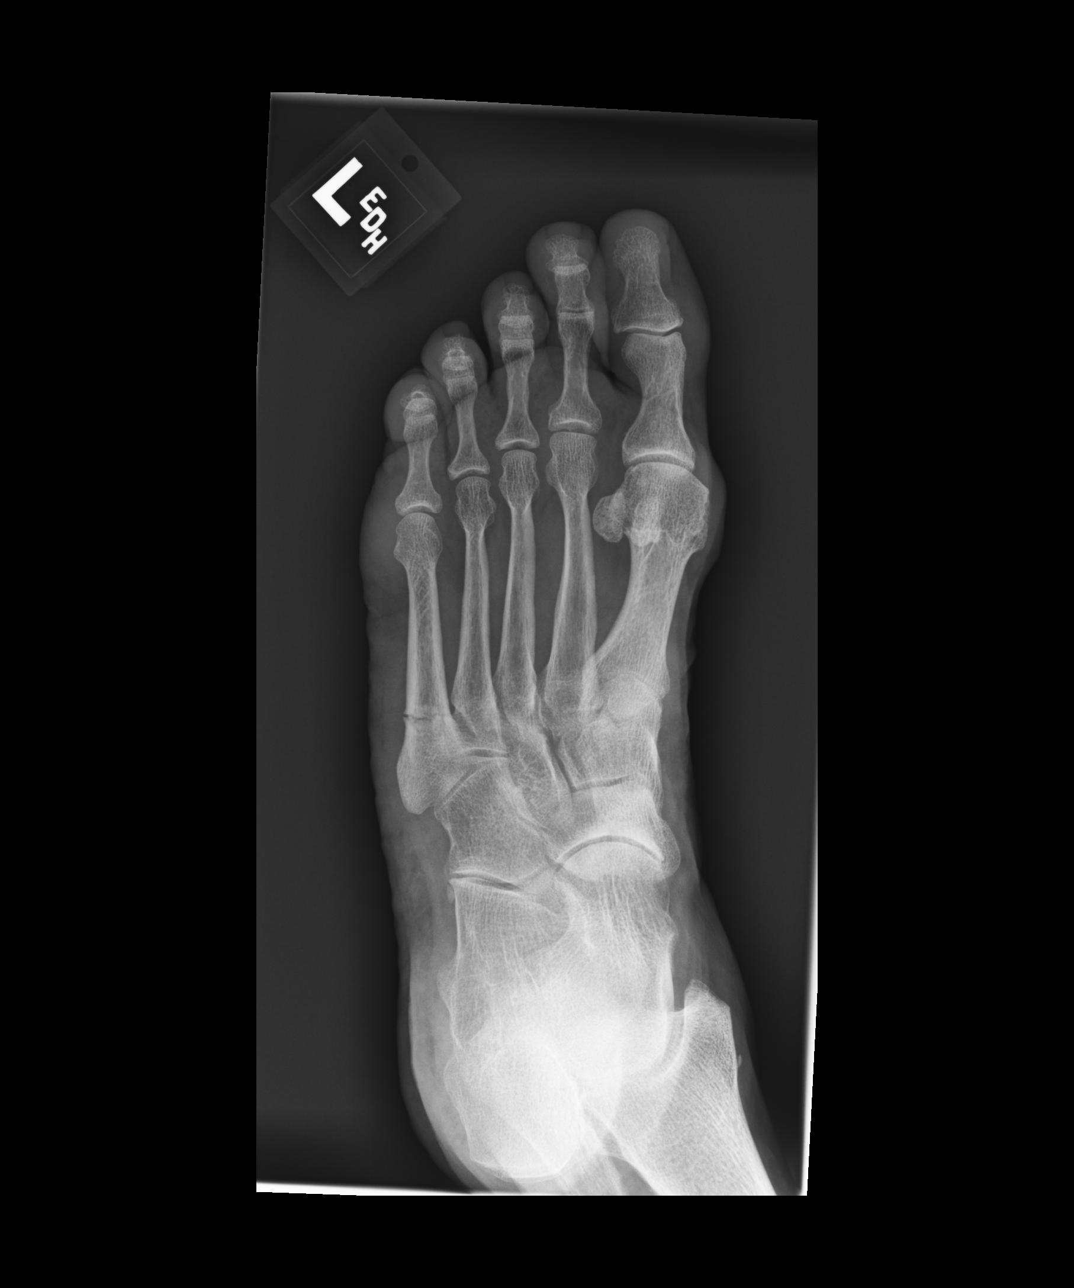

[dg foot complete left (3 of 3)]
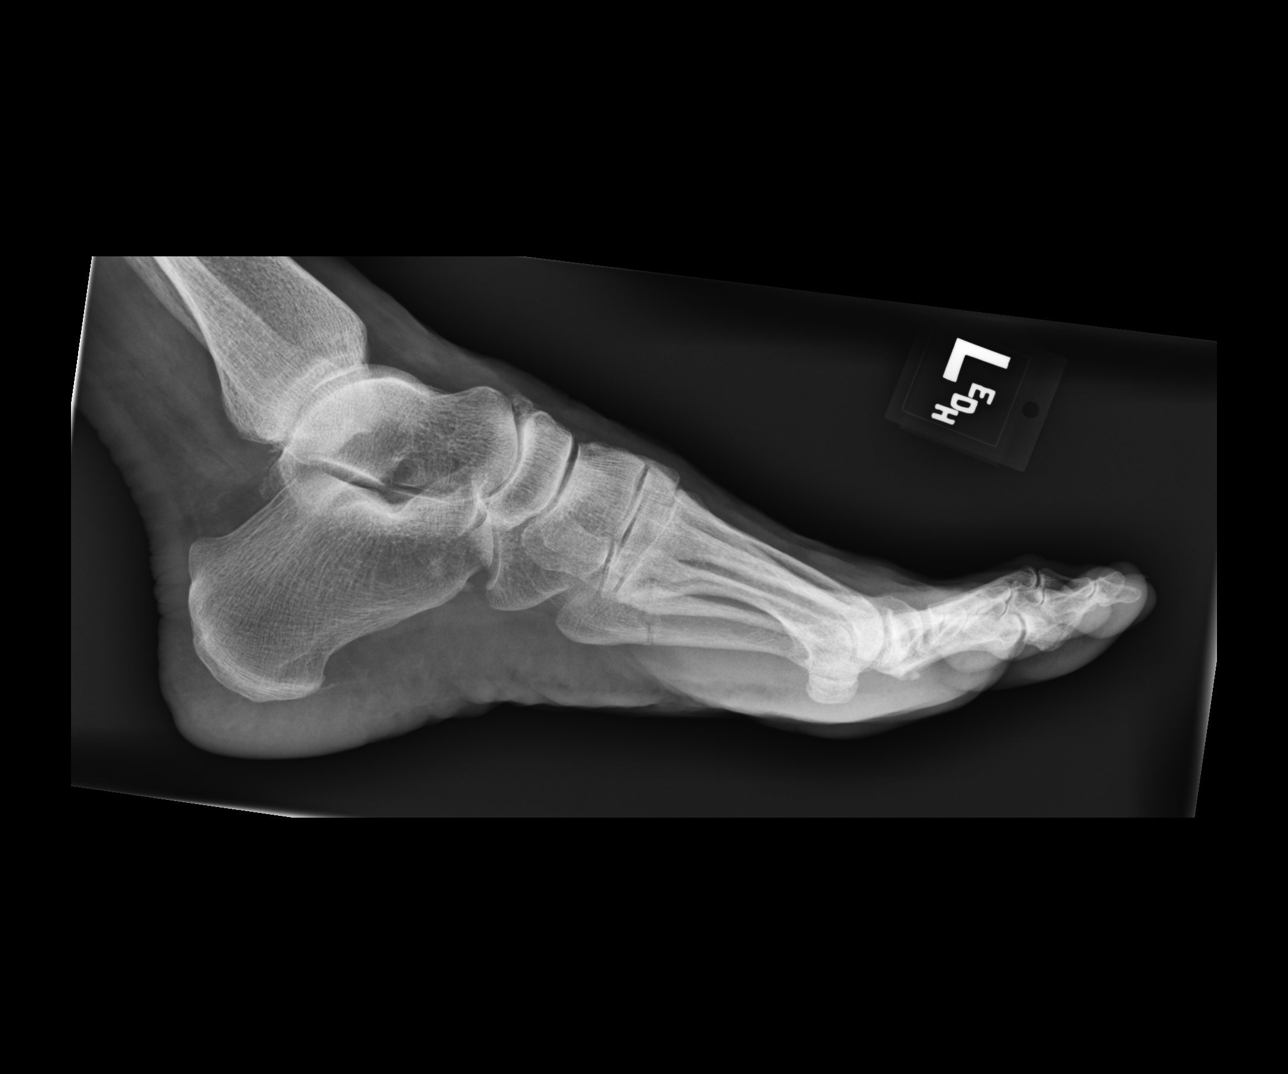

[3 of 3 positions shown; findings below may reference images not displayed]

FINDINGS: Nondisplaced non articular transverse fracture through the proximal
metadiaphysis of the left fifth metatarsal, with minimal endosteal
and periosteal reaction at the fracture site. No additional
fracture. No dislocation. No suspicious focal osseous lesion. Tiny
plantar left calcaneal spur. No radiopaque foreign body.
IMPRESSION: Minimal interval healing response at the nondisplaced nonarticular
proximal left fifth metatarsal fracture site.

## 2019-03-23 MED ORDER — MOMETASONE FUROATE 50 MCG/ACT NA SUSP: 2.0000 | Freq: Every day | NASAL | 0 refills | Status: DC

## 2019-03-23 NOTE — Telephone Encounter (Signed)
I will let her know.   I did research her chart to see if perhaps the application was missed. It looks like the patient called around 12/30/18 and asked for the prescription to be faxed to [facility]. [employer] will not accept anything that is faxed to them. Everything must be mailed, including renewals and prescriptions. I will print another RX to be signed and place in the mail.

## 2019-03-23 NOTE — Telephone Encounter (Signed)
Cherina,  I do not recall doing paperwork for that medication. I have started putting in a quick note in the chart once I finish it, so I think the paperwork must be missing. Please let her know that I am sorry, but we will need another copy. Thanks!  Also please tell her thank you for the peanuts for me!  Arby Barrette

## 2019-03-23 NOTE — Telephone Encounter (Signed)
Received the following message from patient:   "HI Dr. Carlis Abbott, Not sure if my last message got to yo or not, so I ma sending my info again. Hope you don't get it 2 times. Thanks you for completing and sending in my application for [employer] to get help with the cost of my nasonex. They had no record of receiving my application. It was due by 12/31, 2020 to be eligible for 2021.    The [employer] has outsourced their [employer] to a company [employer]. The lady I spoke with today about it did not know if they help people or not. So, right now I don't have any help with my nasonex medication.  One of the nurses called last week asking me to let you  once I heard from [employer] about the application.  Take care and God bless, Ebony Scott PS did you get the sugar coated peanuts I left for you for Christmas?"  I did a quick search for [employer] to see if it was apart of [employer]. From the [employer], it [employer] deals with "women's health, legacy brands and biosimilar businesses".  Dr.Clark, please advise. If you prefer, we can get her setup with pharmacy to see what her options are. Thanks!

## 2019-03-24 ENCOUNTER — Ambulatory Visit (INDEPENDENT_AMBULATORY_CARE_PROVIDER_SITE_OTHER): Payer: Medicare PPO

## 2019-03-24 ENCOUNTER — Other Ambulatory Visit: Payer: Self-pay

## 2019-03-24 DIAGNOSIS — M810 Age-related osteoporosis without current pathological fracture: Secondary | ICD-10-CM | POA: Diagnosis not present

## 2019-03-24 MED ORDER — DENOSUMAB 60 MG/ML ~~LOC~~ SOSY
60.0000 mg | PREFILLED_SYRINGE | Freq: Once | SUBCUTANEOUS | Status: AC
  Administered 2019-03-24: 60 mg via SUBCUTANEOUS

## 2019-03-24 NOTE — Telephone Encounter (Signed)
Pharmacy team, did you hear anything about [employer] not doing patient assistance? Is there anything else we can do for pt to help with the cost of her inhaler and Nasonex? Please advise.

## 2019-03-25 MED ORDER — MOMETASONE FUROATE 50 MCG/ACT NA SUSP: 2.0000 | Freq: Every day | NASAL | 0 refills | Status: DC

## 2019-03-25 MED ORDER — DULERA 200-5 MCG/ACT IN AERO: 2.0000 | INHALATION_SPRAY | Freq: Two times a day (BID) | RESPIRATORY_TRACT | 1 refills | Status: DC

## 2019-03-25 NOTE — Telephone Encounter (Signed)
Ok great, thanks! If there is anything else you need from me please let me know. Thanks so much for your help!  LPC

## 2019-03-25 NOTE — Telephone Encounter (Signed)
Frederik Schmidt, patient advocate, called to follow-up with patient.  We called [employer] patient assistance and verified that they will no longer be providing patient assistance for [facility] or Nasonex.  Patient states she was able to obtain a grant through patient assistance network which will apply to her [facility].  We were unable to find any resources to assist with the cost of Nasonex.  Notified patient that Nasonex comes as a generic.  [name] test claim for generic Nasonex and co-pay was $10.  Prescription sent to local pharmacy.  Mariella Saa, PharmD, [place], [place] [facility] Pharmacist [phone]  03/25/2019 1:19 PM

## 2019-03-30 DIAGNOSIS — J471 Bronchiectasis with (acute) exacerbation: Secondary | ICD-10-CM | POA: Diagnosis not present

## 2019-03-31 ENCOUNTER — Telehealth: Payer: Self-pay | Admitting: Pulmonary Disease

## 2019-03-31 DIAGNOSIS — G4734 Idiopathic sleep related nonobstructive alveolar hypoventilation: Secondary | ICD-10-CM

## 2019-03-31 NOTE — Telephone Encounter (Signed)
Spoke with pt, advised results from AO about her pulse oximetry test. Pt understood and stated I could send the order to [place]. Order sent. Nothing further is needed.

## 2019-03-31 NOTE — Telephone Encounter (Signed)
Oximetry on CPAP without oxygen supplementation reviewed showing significant desaturations  Recommendation 2 L of oxygen to CPAP  Repeat overnight oximetry on CPAP with 2 L of oxygen added to system

## 2019-04-01 ENCOUNTER — Telehealth: Payer: Self-pay | Admitting: Pulmonary Disease

## 2019-04-01 ENCOUNTER — Ambulatory Visit: Payer: Medicare PPO | Attending: Internal Medicine

## 2019-04-01 DIAGNOSIS — G4733 Obstructive sleep apnea (adult) (pediatric): Secondary | ICD-10-CM

## 2019-04-01 DIAGNOSIS — Z23 Encounter for immunization: Secondary | ICD-10-CM | POA: Insufficient documentation

## 2019-04-01 MED ORDER — FLUTICASONE PROPIONATE 50 MCG/ACT NA SUSP: 2.0000 | Freq: Every day | NASAL | 2 refills | Status: DC

## 2019-04-01 NOTE — Telephone Encounter (Signed)
Dr. Ander Slade, please see message from [place] with [employer] and advise if you are okay with [place] ordering split night study in order for pt to qualify.

## 2019-04-01 NOTE — Progress Notes (Signed)
   Covid-19 Vaccination Clinic  Name:  Ebony Scott    MRN: [id number] DOB: [birth year]  04/01/2019  Ebony Scott was observed post Covid-19 immunization for 15 minutes without incidence. She was provided with Vaccine Information Sheet and instruction to access the V-Safe system.   Ebony Scott was instructed to call 911 with any severe reactions post vaccine: Marland Kitchen Difficulty breathing  . Swelling of your face and throat  . A fast heartbeat  . A bad rash all over your body  . Dizziness and weakness    Immunizations Administered    Name Date Dose VIS Date Route   Pfizer COVID-19 Vaccine 04/01/2019  9:58 AM 0.3 mL 01/30/2019 Intramuscular   Manufacturer: [place]   Lot: [id number]   [place]: [id number]

## 2019-04-02 NOTE — Telephone Encounter (Signed)
Okay with me to order it  She did not have significant sleep apnea on a home sleep study-May have been falsely negative, she has significant symptoms during the day

## 2019-04-02 NOTE — Telephone Encounter (Signed)
Still waiting on response from [facility].

## 2019-04-02 NOTE — Telephone Encounter (Signed)
Spoke with the pt and notified that she will need a split night for o2 to be covered  She verbalized understanding  I sent order to [facility] for split night

## 2019-04-09 ENCOUNTER — Ambulatory Visit: Payer: Medicare PPO | Admitting: Podiatry

## 2019-04-09 ENCOUNTER — Encounter (HOSPITAL_BASED_OUTPATIENT_CLINIC_OR_DEPARTMENT_OTHER): Payer: Self-pay

## 2019-04-14 ENCOUNTER — Encounter (HOSPITAL_BASED_OUTPATIENT_CLINIC_OR_DEPARTMENT_OTHER): Payer: Self-pay

## 2019-04-15 NOTE — Telephone Encounter (Signed)
Per Gates Rigg sent to Rosana Berger, CMA  I have precerted the sleep study through [facility] [id number] pt is ok to do this study [name]       Previous Messages

## 2019-04-15 NOTE — Telephone Encounter (Signed)
PCC's please advise if her split night was pre-approved? Thanks  Hi Dr. Carlis Abbott, I set up the  split sleep study that was ordered.  Please see below I sent to Dr. Ander Slade about needing a PRIOR APPROVAL.  Is he is in the office and will get  this  before Mondays test.  If not can you do? My [employer]. requires this before they cover their part of the Oxygen for my Cpap.  [employer]'s prior approval contact information is; Phone #: [phone]    Fax #: [phone]  Please advise once it has been submitted and approved. Thanks so much and God bless, Be safe and well. Zanetta "Dr Ander Slade and nurse, You urgent attention is needed in this matter. I finally got the information on the split sleep study  you have ordered from the [facility] today. It is scheduled for this MONDAY, MARCH 1st, 2021. I found out this afternoon the you must get a prior approval before this test is done. I can not afford to pay for this test out of my pocket.  Please advise when this has been done.  Thanks so much and have a very blessed day. Arbie Cookey"

## 2019-04-16 DIAGNOSIS — M0609 Rheumatoid arthritis without rheumatoid factor, multiple sites: Secondary | ICD-10-CM | POA: Diagnosis not present

## 2019-04-17 ENCOUNTER — Other Ambulatory Visit: Payer: Self-pay | Admitting: Family Medicine

## 2019-04-18 ENCOUNTER — Other Ambulatory Visit (HOSPITAL_COMMUNITY)
Admission: RE | Admit: 2019-04-18 | Discharge: 2019-04-18 | Disposition: A | Discharge status: Home / Self Care | Payer: Medicare PPO | Source: Ambulatory Visit | Attending: Pulmonary Disease | Admitting: Pulmonary Disease

## 2019-04-18 DIAGNOSIS — Z01812 Encounter for preprocedural laboratory examination: Secondary | ICD-10-CM | POA: Diagnosis not present

## 2019-04-18 DIAGNOSIS — Z20822 Contact with and (suspected) exposure to covid-19: Secondary | ICD-10-CM | POA: Diagnosis not present

## 2019-04-18 LAB — SARS CORONAVIRUS 2 (TAT 6-24 HRS): SARS Coronavirus 2: NEGATIVE

## 2019-04-20 ENCOUNTER — Ambulatory Visit (HOSPITAL_BASED_OUTPATIENT_CLINIC_OR_DEPARTMENT_OTHER): Payer: Medicare PPO | Attending: Pulmonary Disease | Admitting: Pulmonary Disease

## 2019-04-20 ENCOUNTER — Other Ambulatory Visit: Payer: Self-pay

## 2019-04-20 DIAGNOSIS — R0902 Hypoxemia: Secondary | ICD-10-CM | POA: Diagnosis not present

## 2019-04-20 DIAGNOSIS — G4733 Obstructive sleep apnea (adult) (pediatric): Secondary | ICD-10-CM

## 2019-04-21 ENCOUNTER — Encounter: Payer: Self-pay | Admitting: Family Medicine

## 2019-04-21 DIAGNOSIS — R0683 Snoring: Secondary | ICD-10-CM | POA: Diagnosis not present

## 2019-04-23 ENCOUNTER — Ambulatory Visit: Payer: Medicare PPO | Admitting: Podiatry

## 2019-04-23 ENCOUNTER — Ambulatory Visit (INDEPENDENT_AMBULATORY_CARE_PROVIDER_SITE_OTHER): Payer: Medicare PPO

## 2019-04-23 ENCOUNTER — Other Ambulatory Visit: Payer: Self-pay

## 2019-04-23 DIAGNOSIS — S92301D Fracture of unspecified metatarsal bone(s), right foot, subsequent encounter for fracture with routine healing: Secondary | ICD-10-CM | POA: Diagnosis not present

## 2019-04-27 DIAGNOSIS — J471 Bronchiectasis with (acute) exacerbation: Secondary | ICD-10-CM | POA: Diagnosis not present

## 2019-04-27 IMAGING — DX DG FOOT COMPLETE 3+V*L*
3 series · 3 of 3 positions shown · non-contrast
Comparison: Left foot series of August 17, 2016 and September 04, 2016

CLINICAL DATA: Closed fracture the base of the fifth metatarsal.
Follow-up.

EXAM:
LEFT FOOT - COMPLETE 3+ VIEW

[dg foot complete left (1 of 3)]
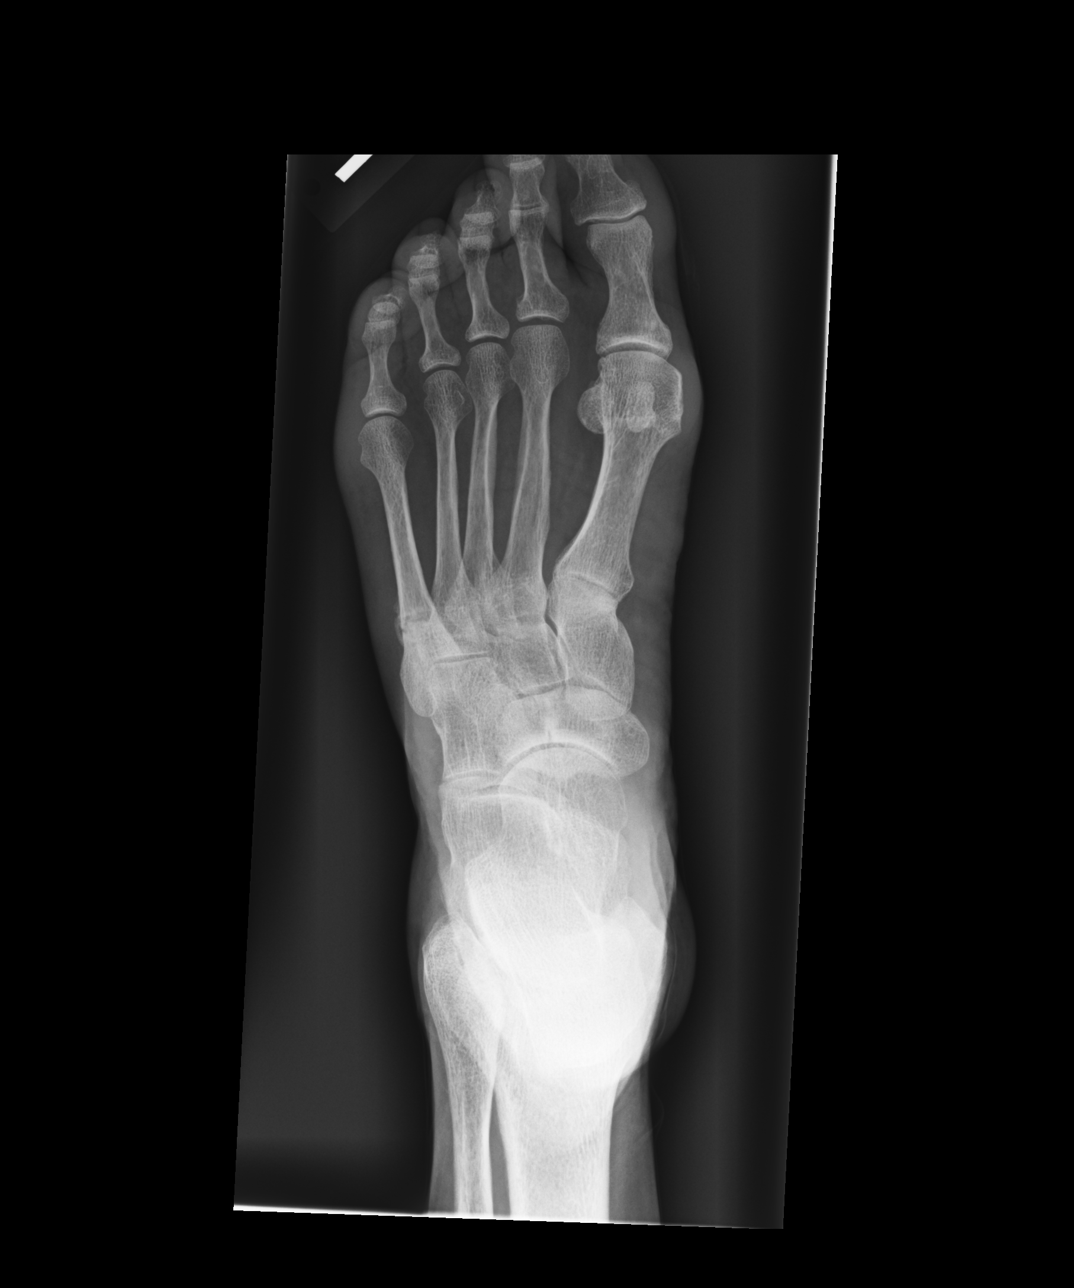

[dg foot complete left (2 of 3)]
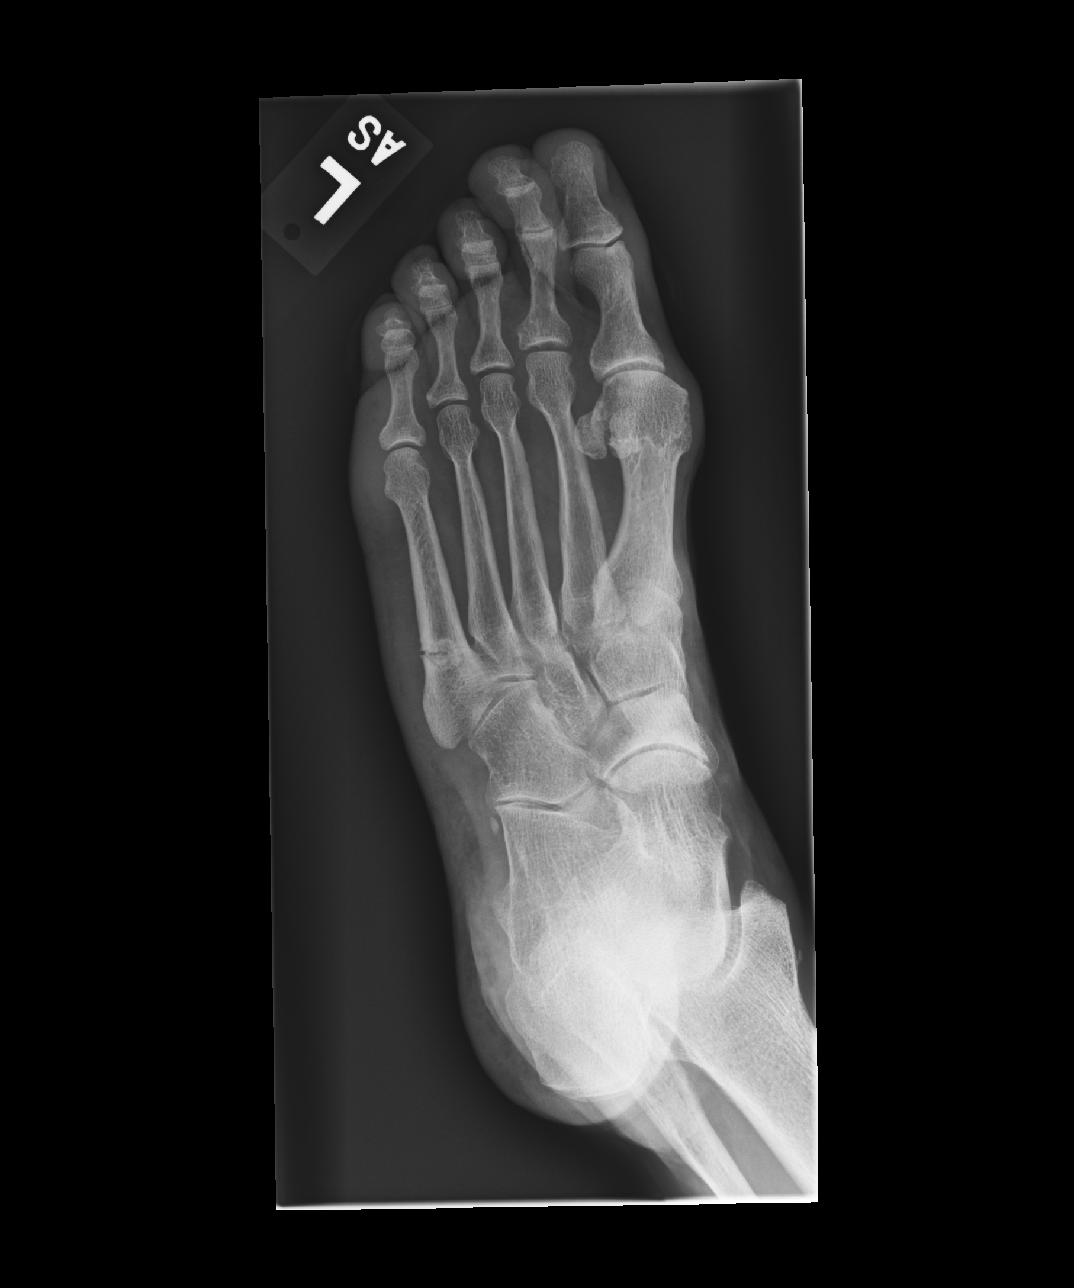

[dg foot complete left (3 of 3)]
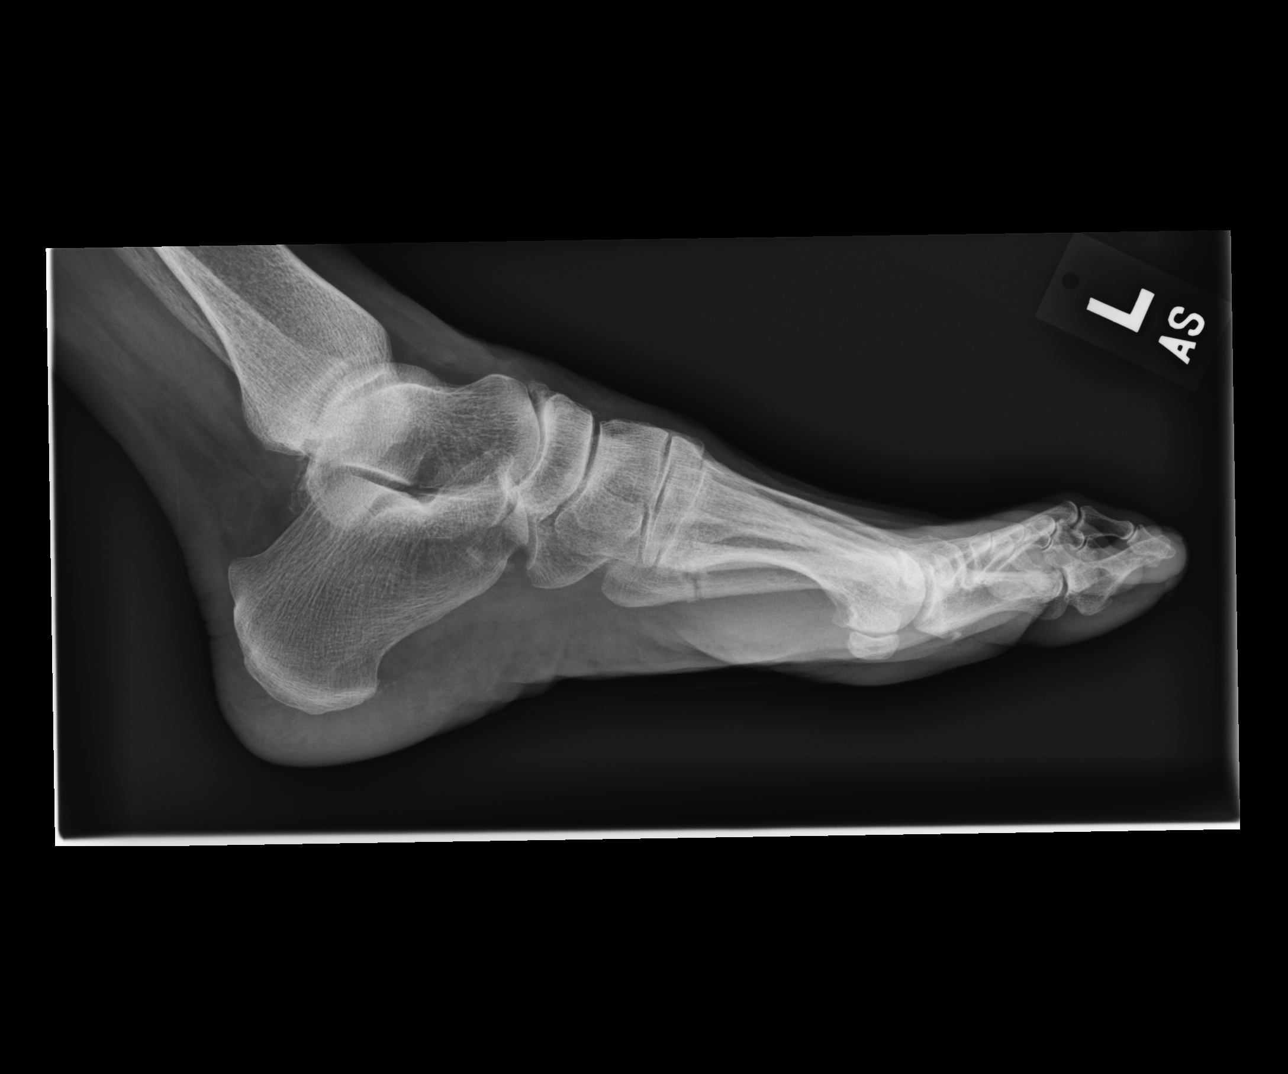

[3 of 3 positions shown; findings below may reference images not displayed]

FINDINGS: The fracture line remains visible but its margins are less distinct
today. There is overlying periosteal reaction which is more
conspicuous today. Alignment remains anatomic. The other metatarsals
are intact as are the phalanges. The bones of the hindfoot exhibit
no acute abnormality.
IMPRESSION: There is ongoing healing of the fracture through the base of the
shaft of the fifth metatarsal.

## 2019-04-28 ENCOUNTER — Telehealth: Payer: Self-pay | Admitting: Pulmonary Disease

## 2019-04-28 DIAGNOSIS — G4733 Obstructive sleep apnea (adult) (pediatric): Secondary | ICD-10-CM

## 2019-04-28 NOTE — Telephone Encounter (Signed)
She is on oxygen supplementation at night with CPAP  Yes, she did have desaturations during the study  The CPAP was ordered because she does have significant airway collapse during sleep, she recently had an 0N0 that showed significant desaturations

## 2019-04-28 NOTE — Telephone Encounter (Signed)
Sleep study result  Date of study 04/20/2019  Impression: Negative study for significant sleep disordered breathing Oxygen desaturations Snoring  Previous home sleep study was also negative for significant sleep apnea   Recommendation Continue current lines of care Oxygen supplementation to address hypoxemia Follow-up as scheduled

## 2019-04-28 NOTE — Telephone Encounter (Signed)
Dr. Ander Slade just to clarify, did pt have oxygen desaturations? Pt was ordered 2lpm O2 in 03/2019 for nocturnal use.

## 2019-04-28 NOTE — Telephone Encounter (Signed)
Called and spoke with pt stating to her the info per [facility] and she verbalized understanding. Pt did not have a f/u scheduled so she has been made an appt with Dr. Carlis Abbott Friday 3/12 at 11:15.  I have placed order for pt to have the 2L O2 bled into cpap. Nothing further needed.

## 2019-04-28 NOTE — Procedures (Signed)
POLYSOMNOGRAPHY  Last, First: Ebony Scott, Ebony Scott MRN: [id number] Gender: Female Age (years): 69 Weight (lbs): 150 DOB: Aug 03, 1950 BMI: 28 Primary Care: No PCP Epworth Score: NA Referring: Laurin Coder MD Technician: Zadie Rhine Interpreting: Laurin Coder MD Study Type: NPSG Ordered Study Type: Split Night CPAP Study date: 04/20/2019 Location: [place] CLINICAL INFORMATION Ebony Scott is a 69 year old Female and was referred to the sleep center for evaluation of G47.30 Sleep Apnea, Unspecified (780.57). Indications include OSA.  MEDICATIONS Patient self administered medications include: N/A. Medications administered during study include Sleep medicine administered - 2 CYPROHEPDINE 8 MG at 10:30:36 PM  SLEEP STUDY TECHNIQUE A multi-channel overnight Polysomnography study was performed. The channels recorded and monitored were central and occipital EEG, electrooculogram (EOG), submentalis EMG (chin), nasal and oral airflow, thoracic and abdominal wall motion, anterior tibialis EMG, snore microphone, electrocardiogram, and a pulse oximetry. TECHNICIAN COMMENTS Comments added by Technician: NO RESTROOM VISTED. Patient had difficulty initiating sleep. Patient was restless all through the night. Comments added by Scorer: N/A SLEEP ARCHITECTURE The study was initiated at 10:49:19 PM and terminated at 4:43:01 AM. The total recorded time was 353.7 minutes. EEG confirmed total sleep time was 270.5 minutes yielding a sleep efficiency of 76.5%%. Sleep onset after lights out was 31.4 minutes with a REM latency of 141.5 minutes. The patient spent 5.9%% of the night in stage N1 sleep, 57.5%% in stage N2 sleep, 13.9%% in stage N3 and 22.7% in REM. Wake after sleep onset (WASO) was 51.8 minutes. The Arousal Index was 19.3/hour. RESPIRATORY PARAMETERS There were a total of 12 respiratory disturbances out of which 2 were apneas ( 2 obstructive, 0 mixed, 0 central) and 10 hypopneas. The apnea/hypopnea  index (AHI) was 2.7 events/hour. The central sleep apnea index was 0.0 events/hour. The REM AHI was 2.0 events/hour and NREM AHI was 2.9 events/hour. The supine AHI was 0.0 events/hour and the non supine AHI was 3.3 supine during 18.45% of sleep. Respiratory disturbances were associated with oxygen desaturation down to a nadir of 86.0% during sleep. The mean oxygen saturation during the study was 92.2%. The cumulative time under 88% oxygen saturation was 5.5 minutes.  LEG MOVEMENT DATA The total leg movements were 0 with a resulting leg movement index of 0.0/hr .Associated arousal with leg movement index was 0.0/hr.  CARDIAC DATA The underlying cardiac rhythm was most consistent with sinus rhythm. Mean heart rate during sleep was 69.2 bpm. Additional rhythm abnormalities include None.   IMPRESSIONS No Significant Obstructive Sleep apnea(OSA) EKG showed no cardiac abnormalities. Moderate Oxygen Desaturation The patient snored with moderate snoring volume. No significant periodic leg movements(PLMs) during sleep. However, no significant associated arousals.   DIAGNOSIS Nocturnal Hypoxemia (327.26 [G47.36 ICD-10])   RECOMMENDATIONS Avoid alcohol, sedatives and other CNS depressants that may worsen sleep apnea and disrupt normal sleep architecture. Oxygen supplementation to address hypoxemia Sleep hygiene should be reviewed to assess factors that may improve sleep quality. Weight management and regular exercise should be initiated or continued.   [Electronically signed] 04/28/2019 08:19 AM  Sherrilyn Rist MD NPI: [id number]

## 2019-04-29 DIAGNOSIS — Z7952 Long term (current) use of systemic steroids: Secondary | ICD-10-CM | POA: Diagnosis not present

## 2019-04-29 DIAGNOSIS — M79643 Pain in unspecified hand: Secondary | ICD-10-CM | POA: Diagnosis not present

## 2019-04-29 DIAGNOSIS — M0609 Rheumatoid arthritis without rheumatoid factor, multiple sites: Secondary | ICD-10-CM | POA: Diagnosis not present

## 2019-04-29 DIAGNOSIS — Z79899 Other long term (current) drug therapy: Secondary | ICD-10-CM | POA: Diagnosis not present

## 2019-04-29 DIAGNOSIS — M7989 Other specified soft tissue disorders: Secondary | ICD-10-CM | POA: Diagnosis not present

## 2019-04-29 DIAGNOSIS — M810 Age-related osteoporosis without current pathological fracture: Secondary | ICD-10-CM | POA: Diagnosis not present

## 2019-04-29 DIAGNOSIS — G4733 Obstructive sleep apnea (adult) (pediatric): Secondary | ICD-10-CM | POA: Diagnosis not present

## 2019-05-01 ENCOUNTER — Other Ambulatory Visit: Payer: Self-pay

## 2019-05-01 ENCOUNTER — Encounter: Payer: Self-pay | Admitting: Family Medicine

## 2019-05-01 ENCOUNTER — Encounter: Payer: Self-pay | Admitting: Critical Care Medicine

## 2019-05-01 ENCOUNTER — Ambulatory Visit: Payer: Medicare PPO | Admitting: Critical Care Medicine

## 2019-05-01 ENCOUNTER — Ambulatory Visit: Payer: Medicare PPO | Admitting: Family Medicine

## 2019-05-01 VITALS — BP 148/78 | HR 76 | Temp 98.0°F | Resp 18 | Ht 61.0 in | Wt 149.0 lb

## 2019-05-01 VITALS — BP 150/80 | HR 80 | Temp 97.3°F | Ht 61.0 in | Wt 149.6 lb

## 2019-05-01 DIAGNOSIS — J398 Other specified diseases of upper respiratory tract: Secondary | ICD-10-CM

## 2019-05-01 DIAGNOSIS — E242 Drug-induced Cushing's syndrome: Secondary | ICD-10-CM

## 2019-05-01 DIAGNOSIS — Z86711 Personal history of pulmonary embolism: Secondary | ICD-10-CM | POA: Diagnosis not present

## 2019-05-01 DIAGNOSIS — R0600 Dyspnea, unspecified: Secondary | ICD-10-CM

## 2019-05-01 DIAGNOSIS — R601 Generalized edema: Secondary | ICD-10-CM | POA: Diagnosis not present

## 2019-05-01 DIAGNOSIS — Z7901 Long term (current) use of anticoagulants: Secondary | ICD-10-CM | POA: Diagnosis not present

## 2019-05-01 DIAGNOSIS — R609 Edema, unspecified: Secondary | ICD-10-CM | POA: Diagnosis not present

## 2019-05-01 DIAGNOSIS — J479 Bronchiectasis, uncomplicated: Secondary | ICD-10-CM

## 2019-05-01 DIAGNOSIS — J4550 Severe persistent asthma, uncomplicated: Secondary | ICD-10-CM | POA: Diagnosis not present

## 2019-05-01 MED ORDER — FUROSEMIDE 40 MG PO TABS: 40.0000 mg | ORAL_TABLET | Freq: Every day | ORAL | 3 refills | Status: DC | PRN

## 2019-05-01 MED ORDER — SPIRIVA RESPIMAT 2.5 MCG/ACT IN AERS: 1.0000 | INHALATION_SPRAY | Freq: Every day | RESPIRATORY_TRACT | 11 refills | Status: DC

## 2019-05-01 NOTE — Progress Notes (Signed)
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 discussed today: Restrictive lung disease, suspect this may be at least due in part to her recent weight gain -Recommend modest weight loss -Recommend continued physical activity as tolerated  Allergic rhinitis -Continue montelukast, Zyrtec  GERD  -Continue PPI and Pepcid  Chronically immunosuppressed; no evidence of opportunistic infection on recent BAL 12/24/2018    RTC in 1 month.    Current Outpatient Medications:  .  acetaminophen (TYLENOL) 650 MG CR tablet, Take 1,300 mg by mouth every 8 (eight) hours as needed for pain., Disp: , Rfl:  .  albuterol (PROAIR HFA) 108 (90 Base) MCG/ACT inhaler, Inhale 2 puffs into the lungs every 4 (four) hours as needed for wheezing or shortness of breath., Disp: 18 g, Rfl: 5 .  Alpha-D-Galactosidase (BEANO PO), Take 1 capsule by mouth 3 (three) times daily as needed (gas/ bloating). , Disp: , Rfl:  .  Alpha-Lipoic Acid 600 MG CAPS, Take 600 mg by mouth daily. , Disp: , Rfl:  .  atorvastatin (LIPITOR) 10 MG tablet, TAKE 1 TABLET BY MOUTH DAILY, Disp: 90 tablet, Rfl: 3 .  azelastine (ASTELIN) 0.1 % nasal spray, USE 2 SPRAYS IN EACH NOSTRIL DAILY  AS DIRECTED, Disp: 30 mL, Rfl: 5 .  Bacillus Coagulans-Inulin (ALIGN PREBIOTIC-PROBIOTIC PO), Take by mouth every evening., Disp: , Rfl:  .  CALCIUM-MAGNESIUM PO, Take 1 tablet by mouth 2 (two) times daily. , Disp: , Rfl:  .  cetirizine (ZYRTEC) 10 MG tablet, Take 5 mg by mouth every evening. , Disp: , Rfl:  .  cholecalciferol (VITAMIN D) 25 MCG (1000 UT) tablet, Take 1,000 Units by mouth daily. , Disp: , Rfl:  .  cyproheptadine (PERIACTIN) 4 MG tablet, Take 2 tablets (8 mg total) by mouth every evening., Disp: 180 tablet, Rfl: 2 .  denosumab (PROLIA) 60 MG/ML SOLN injection, Inject 60 mg into the skin every 6 (six) months. Administer in upper arm, thigh, or abdomen, Disp: , Rfl:  .  dexlansoprazole (DEXILANT) 60 MG capsule, TAKE 1 CAPSULE BY MOUTH EACH MORNING, Disp: 90 capsule, Rfl: 3 .  diclofenac Sodium (VOLTAREN) 1 % GEL, Apply 2 g topically 4 (four) times daily., Disp: 350 g, Rfl: 1 .  EPINEPHrine (EPIPEN 2-PAK) 0.3 mg/0.3 mL IJ SOAJ injection, Inject 0.3 mLs (0.3 mg total) into the muscle Once PRN., Disp: 0.3  mL, Rfl: 2 .  famotidine (PEPCID) 20 MG tablet, Take 1 tablet (20 mg total) by mouth 2 (two) times daily., Disp: 180 tablet, Rfl: 2 .  fluticasone (FLONASE) 50 MCG/ACT nasal spray, Place 2 sprays into both nostrils daily., Disp: 16 g, Rfl: 2 .  gabapentin (NEURONTIN) 600 MG tablet, Take 1 tablet (600 mg total) by mouth daily., Disp: 90 tablet, Rfl: 3 .  Golimumab (SIMPONI [place]), Inject into the skin., Disp: , Rfl:  .  guaiFENesin (MUCINEX) 600 MG 12 hr tablet, Take by mouth 2 (two) times daily., Disp: , Rfl:  .  Lactase (LACTAID FAST ACT) 9000 units TABS, Take 18,000 Units by mouth 4 (four) times daily as needed (dairy consumption). , Disp: , Rfl:  .  leflunomide (ARAVA) 20 MG tablet, Take 20 mg by mouth daily., Disp: , Rfl:  .  lidocaine (LIDODERM) 5 %, Place 1 patch onto the skin daily as needed (pain). Remove & Discard patch within 12 hours or as directed by MD, Disp: , Rfl:  .   mirtazapine (REMERON SOL-TAB) 30 MG disintegrating tablet, DISSOLVE 1 TABLET IN MOUTH EVERY NIGHT AT BEDTIME, Disp: 90 tablet, Rfl: 1 .  mometasone (NASONEX) 50 MCG/ACT nasal spray, Place 2 sprays into the nose daily., Disp: 51 g, Rfl: 0 .  mometasone-formoterol (DULERA) 200-5 MCG/ACT AERO, Inhale 2 puffs into the lungs 2 (two) times daily., Disp: 39 g, Rfl: 1 .  montelukast (SINGULAIR) 10 MG tablet, TAKE 1 CAPSULE BY MOUTH DAILY, Disp: 90 tablet, Rfl: 3 .  mupirocin ointment (BACTROBAN) 2 %, Place 1 application into the nose at bedtime. To prevent nose bleeds, Disp: 90 g, Rfl: 1 .  nystatin (MYCOSTATIN) 100000 UNIT/ML suspension, Swish and spit 88m's twice daily, Disp: 473 mL, Rfl: 3 .  nystatin-triamcinolone ointment (MYCOLOG), Apply topically 2 (two) times daily., Disp: 90 g, Rfl: 1 .  predniSONE (DELTASONE) 5 MG tablet, 10 mg. , Disp: , Rfl:  .  Prenatal Vit-Fe Fumarate-FA (PRENATAL MULTIVITAMIN) TABS tablet, Take 1 tablet by mouth daily at 12 noon., Disp: , Rfl:  .  Propylene Glycol (SYSTANE COMPLETE) 0.6 % SOLN, Apply to eye., Disp: , Rfl:  .  Respiratory Therapy Supplies (FLUTTER) DEVI, Use as directed, Disp: 1 each, Rfl: 0 .  rivaroxaban (XARELTO) 20 MG TABS tablet, TAKE 1 TABLET BY MOUTH DAILY WITH SUPPER, Disp: 90 tablet, Rfl: 3 .  sodium chloride HYPERTONIC 3 % nebulizer solution, Take by nebulization daily., Disp: 750 mL, Rfl: 12 .  Spacer/Aero-Holding Chambers (AEROCHAMBER PLUS) inhaler, Use as instructed, Disp: 1 each, Rfl: 0 .  alum & mag hydroxide-simeth (MAALOX/MYLANTA) 200-200-20 MG/5ML suspension, Take 15 mLs by mouth every 4 (four) hours as needed for indigestion or heartburn., Disp: 355 mL, Rfl: 0 .  Tiotropium Bromide Monohydrate (SPIRIVA RESPIMAT) 2.5 MCG/ACT AERS, Inhale 1 puff into the lungs daily., Disp: 4 g, Rfl: 11   LJulian Hy DO LFort [facility]Pulmonary [facility] Care 05/01/2019 12:16 PM

## 2019-05-01 NOTE — Progress Notes (Signed)
Subjective:    Patient ID: Ebony Scott, female    DOB: 08-27-1950, 69 y.o.   MRN: [id number]  HPI 03/18/18 Patient is here today for hospital discharge follow-up.  I have reviewed the discharge summary from the hospital.  Patient's lab work was significant for an elevated CRP as well as an elevated sed rate greater than 140.  Patient was lethargic with fevers.  As soon as she started prednisone, her fevers resolved and she hard to feel better.  She is now down to 30 mg a day of prednisone.  She is starting to have some mild pain in the joints of her wrist but otherwise is doing much better than the way she appeared at the last visit.  The question is why she having fevers of unknown origin.  Why is her sed rate and her CRP elevated.  She is seeing rheumatology who is done a battery of blood tests which are still pending.  My suspicion is some type of atypical polymyalgia rheumatica versus an underlying bone marrow abnormality.  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  She also reports a cough today.  There are bibasilar crackles on exam however this was present at the last visit I suspect may be atelectasis.  At that time, my plan was: Patient has fever of unknown origin.  Leading suspicion is an autoimmune disease however work-up thus far has been negative.  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  She is under the care of a rheumatologist and further blood work is pending.  At the present time we will make no adjustments in her prednisone and I will defer this to her rheumatologist.  However I still question why the patient developed a DVT initially last fall prompting all the complications that arose thereafter.  This was unprovoked.  This would raise the concern for hypercoagulable state.  She is also been having fever of unknown origin.  There is also been a substantial  drop in her hemoglobin from 15 down to 7.9 since the DVT.  Now during that time she has been started on anticoagulation however her initial stool study was negative for blood.  Work-up has showed a low iron however she has been on iron replacement now for several weeks and her hemoglobin has not improved.  In fact it is dropped further.  Therefore I am going to repeat a stool study today.  If there is blood in the stool, she will need a GI evaluation for blood loss causing her anemia.  If the stool study is negative, I will repeat an iron level.  If the iron level is now normal, I would recommend an oncology hematology consultation for anemia and to evaluate for possible underlying bone marrow abnormalities that may explain the patient's fever of unknown origin, diffuse joint and bone pain, hypercoagulable state, etc.  Patient states that the rheumatologist is going to schedule her for a PET scan as well if the initial lab work is normal.  He however he is waiting for the lab work to return prior to scheduling the PET scan.  Therefore I will be interested to see the results of the PET scan as well.  She does have bibasilar crackles today on her exam however I will obtain a chest x-ray as I believe this may be atelectasis.  If there is evidence of infiltrate, I  will treat the patient for possible hospital-acquired pneumonia  05/23/18 Most recent Hgb had increased from 9.7 to 10.9 in Feb. patient is due to recheck her CBC today.  She is still taking iron replacement.  She has now completed 6 months of anticoagulation for her DVT and PE.  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  This 1 occurred unprovoked.  Therefore again we had a discussion today about discontinuing her anticoagulant versus continuing lifelong anticoagulation.  Given the risk of developing a third DVT/PE in her lifetime, the patient elects to stay on lifetime anticoagulation at this point.  She denies any rectal bleeding.  She denies any  melena.  She denies any hemoptysis.  She is seeing rheumatology who is diagnosed her with an inflammatory rheumatoid arthritis despite normal lab studies.  However the patient had a profoundly elevated sedimentation rate and this is been the consensus diagnosis.  She has been gradually weaned down on prednisone and they have started methotrexate 2 weeks ago.  At the end of last year, the patient has significant elevations in her LFTs which may have been due to autoimmune inflammation possibly due to her underlying inflammatory syndrome.  However at that time we discontinued her statin.  We are due today to recheck a fasting lipid panel as she has been off her statin now for almost 3 months.  However we will need to monitor her liver function test closely prior to resuming any statin particular now that the patient is on methotrexate.  At that time, my plan was: Patient had elevated liver function test that were never truly determine what the source is.  She is off her statin.  I suspect that there may have been some autoimmune hepatitis possibly causing the elevated LFTs.  Therefore I will repeat a CMP today.  If her liver function test are stable or better on the methotrexate, I would consider resuming her statin particular if her LDL cholesterol is elevated.  I will monitor her anemia with a CBC and if her hemoglobin has normalized, we will discontinue iron.  I will also check her cholesterol as stated above.  Her thyroid was just checked in February and was normal.  I would repeat that in August.  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  Therefore we will monitor her hemoglobin closely as she will continue her anticoagulant indefinitely.  As stated in the history and physical, her wart on her right second toe was treated with liquid nitrogen cryotherapy.  07/04/18 After the patient's last visit, she was started on a  statin for her elevated cholesterol.  I had her return today so that we can recheck her cholesterol along with her liver function test given the fact she is also on methotrexate to monitor for any evidence of hepatitis.  She is tolerating the statin well without any myalgias.  However she reports a 3-week history of cough that is gradually getting worse and is now productive of yellow and green sputum.  She also reports chest congestion that has not improved since starting Mucinex and Zyrtec.  She denies any fevers.  She denies any body aches.  She has been quarantining at home and has no sick contacts that she is aware of.  At that time, my plan was: Check a CMP and a fasting lipid panel to monitor for any evidence of toxicity due to her statin.  Ideally I like  her LDL cholesterol to be below 100.  I am concerned the patient may have bronchitis given her compromised immune system on prednisone and methotrexate I will start the patient on a Z-Pak however it is most likely that this is viral or allergy related.  Therefore I recommended that she continue Mucinex and Zyrtec.  Recheck immediately if she develops fevers chills or worsening shortness of breath  05/01/19 Patient has been on prednisone now for almost 14 months.  She is having to take this for rheumatoid arthritis.  Her rheumatologist is try to wean her off several times without any success.  They have tried methotrexate and are now on biologic medications however the patient continues to require 15 to 70 mg of prednisone every day.  She presents today with significant swelling in her left leg but she also has swelling in her right leg.  She saw her pulmonologist this morning who ordered venous Dopplers to evaluate for any DVT.  The patient has a history of a DVT in her left veins in 2014 and a DVT in her right leg in 2019.  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  However today she has +1  pitting edema in her right leg and +1 pitting edema in her left leg up to her knees.  She also has a buffalo hump.  She has moon facies with significant rounding of the face compared to prior to taking prednisone.  She denies any chest pain.  Her shortness of breath is stable.  She has a history of chronic kidney disease but no evidence of renal failure.  Past Medical History:  Diagnosis Date  . Acute deep vein thrombosis (DVT) of right lower extremity ([place]) 12/13/2017  . Allergic rhinitis   . Allergy    SEASONAL  . Anemia   . Anxiety    pt denies  . Asthma   . Breast cancer ([place]) 1998   in remission  . Cataract    REMOVED  . Clostridium difficile colitis 01/14/2018  . Clostridium difficile diarrhea 01/14/2018  . Complication of anesthesia    "had hard time waking up from it several times" (02/20/2012)  . Depression    "some; don't take anything for it" (02/20/2012)  . Diverticulosis   . DVT (deep venous thrombosis) ([place])   . Exertional dyspnea   . Fibromyalgia 11/2011  . GERD (gastroesophageal reflux disease)   . Graves disease   . Headache(784.0)    "related to allergies; more at different times during the year" (02/20/2012)  . Hemorrhoids   . Hiatal hernia    back and neck  . Hx of adenomatous colonic polyps 04/12/2016  . Hypercholesteremia    good cholesterol is high  . Hypothyroidism   . IBS (irritable bowel syndrome)   . Moderate persistent asthma    -FeV1 72% 2011, -IgE 102 2011, CT sinus Neg 2011  . Osteoporosis    on reclast yearly  . Pneumonia 04/2011; ~ 11/2011   "double; single" (02/20/2012)  . Seronegative rheumatoid arthritis ([place])    Dr. Lahoma Rocker  . SIRS (systemic inflammatory response syndrome) ([place]) 02/10/2018  . Tracheobronchomalacia    Past Surgical History:  Procedure Laterality Date  . ANTERIOR AND POSTERIOR REPAIR  1990's  . APPENDECTOMY    . BREAST LUMPECTOMY  1998   left  . CARPOMETACARPEL ([place]) FUSION OF THUMB WITH AUTOGRAFT FROM RADIUS  ~  2009   "both thumbs" (02/20/2012)  .  CATARACT EXTRACTION W/ INTRAOCULAR LENS  IMPLANT, BILATERAL  2012  . CERVICAL DISCECTOMY  10/2001   C5-C6  . CERVICAL FUSION  2003   C3-C4  . CHOLECYSTECTOMY    . COLONOSCOPY    . DEBRIDEMENT TENNIS ELBOW  ?1970's   right  . ESOPHAGOGASTRODUODENOSCOPY    . HYSTERECTOMY    . KNEE ARTHROPLASTY  ?1990's   "?right; w/cartilage repair" (02/20/2012)  . NASAL SEPTUM SURGERY  1980's  . POSTERIOR CERVICAL FUSION/FORAMINOTOMY  2004   "failed initial fusion; rewired  anterior neck" (02/20/2012)  . TONSILLECTOMY  ~ 1953  . VESICOVAGINAL FISTULA CLOSURE W/ TAH  1988  . VIDEO BRONCHOSCOPY Bilateral 08/23/2016   Procedure: VIDEO BRONCHOSCOPY WITH FLUORO;  Surgeon: Javier Glazier, MD;  Location: [facility];  Service: Cardiopulmonary;  Laterality: Bilateral;   Current Outpatient Medications on File Prior to Visit  Medication Sig Dispense Refill  . acetaminophen (TYLENOL) 650 MG CR tablet Take 1,300 mg by mouth every 8 (eight) hours as needed for pain.    Marland Kitchen albuterol (PROAIR HFA) 108 (90 Base) MCG/ACT inhaler Inhale 2 puffs into the lungs every 4 (four) hours as needed for wheezing or shortness of breath. 18 g 5  . Alpha-D-Galactosidase (BEANO PO) Take 1 capsule by mouth 3 (three) times daily as needed (gas/ bloating).     . Alpha-Lipoic Acid 600 MG CAPS Take 600 mg by mouth daily.     Marland Kitchen alum & mag hydroxide-simeth (MAALOX/MYLANTA) 200-200-20 MG/5ML suspension Take 15 mLs by mouth every 4 (four) hours as needed for indigestion or heartburn. 355 mL 0  . atorvastatin (LIPITOR) 10 MG tablet TAKE 1 TABLET BY MOUTH DAILY 90 tablet 3  . azelastine (ASTELIN) 0.1 % nasal spray USE 2 SPRAYS IN EACH NOSTRIL DAILY AS DIRECTED 30 mL 5  . Bacillus Coagulans-Inulin (ALIGN PREBIOTIC-PROBIOTIC PO) Take by mouth every evening.    Marland Kitchen CALCIUM-MAGNESIUM PO Take 1 tablet by mouth 2 (two) times daily.     . cetirizine (ZYRTEC) 10 MG tablet Take 5 mg by mouth every evening.     .  cholecalciferol (VITAMIN D) 25 MCG (1000 UT) tablet Take 1,000 Units by mouth daily.     . cyproheptadine (PERIACTIN) 4 MG tablet Take 2 tablets (8 mg total) by mouth every evening. 180 tablet 2  . denosumab (PROLIA) 60 MG/ML SOLN injection Inject 60 mg into the skin every 6 (six) months. Administer in upper arm, thigh, or abdomen    . dexlansoprazole (DEXILANT) 60 MG capsule TAKE 1 CAPSULE BY MOUTH EACH MORNING 90 capsule 3  . diclofenac Sodium (VOLTAREN) 1 % GEL Apply 2 g topically 4 (four) times daily. 350 g 1  . EPINEPHrine (EPIPEN 2-PAK) 0.3 mg/0.3 mL IJ SOAJ injection Inject 0.3 mLs (0.3 mg total) into the muscle Once PRN. 0.3 mL 2  . famotidine (PEPCID) 20 MG tablet Take 1 tablet (20 mg total) by mouth 2 (two) times daily. 180 tablet 2  . fluticasone (FLONASE) 50 MCG/ACT nasal spray Place 2 sprays into both nostrils daily. 16 g 2  . gabapentin (NEURONTIN) 600 MG tablet Take 1 tablet (600 mg total) by mouth daily. 90 tablet 3  . Golimumab (SIMPONI [place]) Inject into the skin.    Marland Kitchen guaiFENesin (MUCINEX) 600 MG 12 hr tablet Take by mouth 2 (two) times daily.    . Lactase (LACTAID FAST ACT) 9000 units TABS Take 18,000 Units by mouth 4 (four) times daily as needed (dairy consumption).     Marland Kitchen leflunomide (ARAVA)  20 MG tablet Take 20 mg by mouth daily.    Marland Kitchen lidocaine (LIDODERM) 5 % Place 1 patch onto the skin daily as needed (pain). Remove & Discard patch within 12 hours or as directed by MD    . mirtazapine (REMERON SOL-TAB) 30 MG disintegrating tablet DISSOLVE 1 TABLET IN MOUTH EVERY NIGHT AT BEDTIME 90 tablet 1  . mometasone (NASONEX) 50 MCG/ACT nasal spray Place 2 sprays into the nose daily. 51 g 0  . mometasone-formoterol (DULERA) 200-5 MCG/ACT AERO Inhale 2 puffs into the lungs 2 (two) times daily. 39 g 1  . montelukast (SINGULAIR) 10 MG tablet TAKE 1 CAPSULE BY MOUTH DAILY 90 tablet 3  . mupirocin ointment (BACTROBAN) 2 % Place 1 application into the nose at bedtime. To prevent nose bleeds 90 g  1  . nystatin (MYCOSTATIN) 100000 UNIT/ML suspension Swish and spit 34mL's twice daily 473 mL 3  . nystatin-triamcinolone ointment (MYCOLOG) Apply topically 2 (two) times daily. 90 g 1  . predniSONE (DELTASONE) 5 MG tablet 10 mg.     . Prenatal Vit-Fe Fumarate-FA (PRENATAL MULTIVITAMIN) TABS tablet Take 1 tablet by mouth daily at 12 noon.    Marland Kitchen Propylene Glycol (SYSTANE COMPLETE) 0.6 % SOLN Apply to eye.    Marland Kitchen Respiratory Therapy Supplies (FLUTTER) DEVI Use as directed 1 each 0  . rivaroxaban (XARELTO) 20 MG TABS tablet TAKE 1 TABLET BY MOUTH DAILY WITH SUPPER 90 tablet 3  . sodium chloride HYPERTONIC 3 % nebulizer solution Take by nebulization daily. 750 mL 12  . Spacer/Aero-Holding Chambers (AEROCHAMBER PLUS) inhaler Use as instructed 1 each 0  . Tiotropium Bromide Monohydrate (SPIRIVA RESPIMAT) 2.5 MCG/ACT AERS Inhale 1 puff into the lungs daily. 4 g 11   No current facility-administered medications on file prior to visit.   Allergies  Allergen Reactions  . Dust Mite Extract Shortness Of Breath and Other (See Comments)    "sneezing" (02/20/2012)  . Molds & Smuts Shortness Of Breath  . Morphine Sulfate Itching  . Other Shortness Of Breath and Other (See Comments)    Grass and weeds "sneezing; filled sinuses" (02/20/2012)  . Penicillins Rash and Other (See Comments)    "welts" (02/20/2012) Has patient had a PCN reaction causing immediate rash, facial/tongue/throat swelling, SOB or lightheadedness with hypotension: Unknown Has patient had a PCN reaction causing severe rash involving mucus membranes or skin necrosis: No Has patient had a PCN reaction that required hospitalization: No Has patient had a PCN reaction occurring within the last 10 years: No If all of the above answers are "NO", then may proceed with Cephalosporin use.   Marland Kitchen Reclast [Zoledronic Acid] Other (See Comments)    Fever, Put in hospital, dr said it was a reaction from a reaction   . Rofecoxib Swelling    Vioxx REACTION:  feet swelling  . Shrimp Flavor Anaphylaxis    ALL SHELLFISH  . Tetracycline Hcl Nausea And Vomiting  . Xolair [Omalizumab] Other (See Comments)    Caused Blood clot  . Dilaudid [Hydromorphone Hcl] Itching  . Hydrocodone-Acetaminophen Nausea And Vomiting  . Levofloxacin Other (See Comments)    REACTION: GI upset  . Oxycodone Hcl Nausea And Vomiting  . Paroxetine Nausea And Vomiting    Paxil   . Celecoxib Swelling    Feet swelling  . Diltiazem Swelling  . Lactose Intolerance (Gi) Other (See Comments)    Bloating and gas  . Tree Extract Other (See Comments)    "tested and told I was allergic to  it; never experienced a reaction to it" (02/20/2012)   Social History   Socioeconomic History  . Marital status: Divorced    Spouse name: Not on file  . Number of children: 2  . Years of education: college  . Highest education level: Not on file  Occupational History  . Occupation: Disabled    Comment: Retired Engineer, production: RETIRED  Tobacco Use  . Smoking status: Passive Smoke Exposure - Never Smoker  . Smokeless tobacco: Never Used  . Tobacco comment: Parents  Substance and Sexual Activity  . Alcohol use: Not Currently    Alcohol/week: 0.0 standard drinks  . Drug use: No  . Sexual activity: Never  Other Topics Concern  . Not on file  Social History Narrative   Patient lives at home alone. Patient  divorced.    Patient has her BS degree.   Right handed.   Caffeine- sometimes coffee.       Pulmonary:   Born in [place], [place]. She worked as a Copywriter, advertising. She has no pets currently. She does have indoor plants. Previously had mold in her home that was remediated. Carpet was removed.          Social Determinants of Health   Financial Resource Strain:   . Difficulty of Paying Living Expenses:   Food Insecurity:   . Worried About Charity fundraiser in the Last Year:   . Arboriculturist in the Last Year:   Transportation Needs:   .  Film/video editor (Medical):   Marland Kitchen Lack of Transportation (Non-Medical):   Physical Activity:   . Days of Exercise per Week:   . Minutes of Exercise per Session:   Stress:   . Feeling of Stress :   Social Connections:   . Frequency of Communication with Friends and Family:   . Frequency of Social Gatherings with Friends and Family:   . Attends Religious Services:   . Active Member of Clubs or Organizations:   . Attends Archivist Meetings:   Marland Kitchen Marital Status:   Intimate Partner Violence:   . Fear of Current or Ex-Partner:   . Emotionally Abused:   Marland Kitchen Physically Abused:   . Sexually Abused:      Review of Systems  All other systems reviewed and are negative.      Objective:   Physical Exam Vitals reviewed.  Constitutional:      General: She is not in acute distress.    Appearance: Normal appearance. She is well-developed. She is not ill-appearing or toxic-appearing.  HENT:     Right Ear: Tympanic membrane and ear canal normal.     Left Ear: Tympanic membrane and ear canal normal.     Nose: No congestion or rhinorrhea.  Cardiovascular:     Rate and Rhythm: Normal rate and regular rhythm.     Heart sounds: Normal heart sounds.  Pulmonary:     Effort: Pulmonary effort is normal. No respiratory distress.     Breath sounds: No stridor or decreased air movement. No wheezing or rales.  Abdominal:     General: Bowel sounds are normal. There is no distension.     Palpations: Abdomen is soft.     Tenderness: There is no abdominal tenderness. There is no guarding.  Musculoskeletal:     Right lower leg: Edema present.     Left lower leg: Edema present.  Skin:    Findings: No erythema.  Neurological:  Mental Status: She is alert and oriented to person, place, and time.     Cranial Nerves: No cranial nerve deficit.     Sensory: No sensory deficit.     Motor: No weakness.     Coordination: Coordination normal.   Moon facies, buffalo hump, abdominal  distention       Assessment & Plan:  Anasarca - Plan: CBC with Differential/Platelet, Brain natriuretic peptide, COMPLETE METABOLIC PANEL WITH GFR, Urinalysis, Routine w reflex microscopic  Iatrogenic Cushing's syndrome (HCC)   I believe this is most likely a side effect of her prednisone.  Patient has iatrogenic Cushing's syndrome based on physical exam.  Edema is also associated with this.  I will check a urinalysis to evaluate for any evidence of nephrotic syndrome.  I will check a BMP/CMP to evaluate for any evidence of renal failure or liver toxicity.  I will check a BNP to evaluate for any evidence of congestive heart failure although her last echocardiogram in 2019 showed mild diastolic dysfunction and no evidence of systolic failure.  If lab work is normal, I would treat the patient symptomatically with Lasix 40 mg a day as needed for swelling.  I explained this in detail and spent more than 25 minutes today with the patient outlining the causes of this and the treatment.  Patient is working with her rheumatologist to try to wean off prednisone.

## 2019-05-01 NOTE — Patient Instructions (Addendum)
Thank you for visiting Dr. Carlis Abbott at [facility]. We recommend the following: Orders Placed This Encounter  Procedures  . VAS [place] LOWER EXTREMITY VENOUS (DVT)   No orders of the defined types were placed in this encounter.   Meds ordered this encounter  Medications  . Tiotropium Bromide Monohydrate (SPIRIVA RESPIMAT) 2.5 MCG/ACT AERS    Sig: Inhale 1 puff into the lungs daily.    Dispense:  4 g    Refill:  11    Return in about 4 weeks (around 05/29/2019).    Please do your part to reduce the spread of COVID-19.

## 2019-05-03 ENCOUNTER — Encounter: Payer: Self-pay | Admitting: Family Medicine

## 2019-05-04 ENCOUNTER — Other Ambulatory Visit: Payer: Self-pay

## 2019-05-04 ENCOUNTER — Ambulatory Visit (HOSPITAL_COMMUNITY)
Admission: RE | Admit: 2019-05-04 | Discharge: 2019-05-04 | Disposition: A | Discharge status: Home / Self Care | Payer: Medicare PPO | Source: Ambulatory Visit | Attending: Cardiovascular Disease | Admitting: Cardiovascular Disease

## 2019-05-04 ENCOUNTER — Telehealth: Payer: Self-pay | Admitting: Critical Care Medicine

## 2019-05-04 ENCOUNTER — Telehealth: Payer: Self-pay | Admitting: Pulmonary Disease

## 2019-05-04 DIAGNOSIS — R609 Edema, unspecified: Secondary | ICD-10-CM | POA: Diagnosis not present

## 2019-05-04 NOTE — Progress Notes (Signed)
Please let Ebony Scott know that she has no DVTs. She has a cyst behing her left knee that is very common and does not usually cause problems other than swelling.  Thanks!

## 2019-05-04 NOTE — Telephone Encounter (Signed)
Melissa called me and wanted to make sure we know that they can provide O2 as private pay - insurance just won't cover.

## 2019-05-04 NOTE — Progress Notes (Signed)
Subjective: 69 year old female presents the office for follow-up evaluation of fifth metatarsal fracture of the right foot.  She states that she is doing better.  No significant pain.  She has no other concerns today.  Denies any increase in swelling or redness or any new injuries. Denies any systemic complaints such as fevers, chills, nausea, vomiting. No acute changes since last appointment, and no other complaints at this time.   Objective: AAO x3, NAD DP/PT pulses palpable bilaterally, CRT less than 3 seconds No significant discomfort to palpation of the right foot on the fifth metatarsal.  There is no significant edema there is no erythema or warmth.  No pain to the peroneal tendons.  Flexor, extensor tendons appear to be intact.   No pain with calf compression, swelling, warmth, erythema  Assessment: 69 year old female right fifth metatarsal fracture  Plan: -All treatment options discussed with the patient including all alternatives, risks, complications.  -Repeat x-rays obtained reviewed.  Radiolucent line still evident along the fifth metatarsal however there is callus formation and evidence of healing.  There is no other evidence of acute fracture identified. -Still recommend immobilization in a cam boot.  She is wearing a regular shoe today she does not drive in the boot.  Return in about 4 weeks (around 05/21/2019).  Repeat x-ray with hopeful return to regular [place] DPM

## 2019-05-04 NOTE — Telephone Encounter (Signed)
Spoke with pt, advised her of message from [place]. Pt understood and nothing further is needed.

## 2019-05-05 NOTE — Telephone Encounter (Signed)
I called [facility] direct line but there was no answer. LM to call back.

## 2019-05-06 NOTE — Telephone Encounter (Signed)
Called the number provided in the message - was for [facility] lab.  Patient had venous doppler done 3.15.21 and per that result note, patient is already aware.  Will sign off.

## 2019-05-07 ENCOUNTER — Other Ambulatory Visit: Payer: Self-pay

## 2019-05-07 ENCOUNTER — Other Ambulatory Visit: Payer: Medicare PPO

## 2019-05-07 DIAGNOSIS — R601 Generalized edema: Secondary | ICD-10-CM | POA: Diagnosis not present

## 2019-05-08 LAB — BRAIN NATRIURETIC PEPTIDE: Brain Natriuretic Peptide: 41 pg/mL (ref <100)

## 2019-05-08 LAB — COMPLETE METABOLIC PANEL WITH GFR
AG Ratio: 1.8 (calc) (ref 1.0–2.5)
ALT: 53 U/L — ABNORMAL HIGH (ref 6–29)
AST: 37 U/L — ABNORMAL HIGH (ref 10–35)
Albumin: 4 g/dL (ref 3.6–5.1)
Alkaline phosphatase (APISO): 40 U/L (ref 37–153)
BUN/Creatinine Ratio: 25 (calc) — ABNORMAL HIGH (ref 6–22)
BUN: 41 mg/dL — ABNORMAL HIGH (ref 7–25)
CO2: 33 mmol/L — ABNORMAL HIGH (ref 20–32)
Calcium: 9.8 mg/dL (ref 8.6–10.4)
Chloride: 98 mmol/L (ref 98–110)
Creat: 1.67 mg/dL — ABNORMAL HIGH (ref 0.60–0.93)
GFR, Est African American: 35 mL/min/{1.73_m2} — ABNORMAL LOW (ref >=60)
GFR, Est Non African American: 30 mL/min/{1.73_m2} — ABNORMAL LOW (ref >=60)
Globulin: 2.2 g/dL (calc) (ref 1.9–3.7)
Glucose, Bld: 126 mg/dL — ABNORMAL HIGH (ref 65–99)
Potassium: 3.9 mmol/L (ref 3.5–5.3)
Sodium: 142 mmol/L (ref 135–146)
Total Bilirubin: 0.6 mg/dL (ref 0.2–1.2)
Total Protein: 6.2 g/dL (ref 6.1–8.1)

## 2019-05-08 LAB — CBC WITH DIFFERENTIAL/PLATELET
Absolute Monocytes: 1164 cells/uL — ABNORMAL HIGH (ref 200–950)
Basophils Absolute: 48 cells/uL (ref 0–200)
Basophils Relative: 0.4 %
Eosinophils Absolute: 84 cells/uL (ref 15–500)
Eosinophils Relative: 0.7 %
HCT: 38.1 % (ref 35.0–45.0)
Hemoglobin: 13 g/dL (ref 11.7–15.5)
Lymphs Abs: 1176 cells/uL (ref 850–3900)
MCH: 34.4 pg — ABNORMAL HIGH (ref 27.0–33.0)
MCHC: 34.1 g/dL (ref 32.0–36.0)
MCV: 100.8 fL — ABNORMAL HIGH (ref 80.0–100.0)
MPV: 11.1 fL (ref 7.5–12.5)
Monocytes Relative: 9.7 %
Neutro Abs: 9528 cells/uL — ABNORMAL HIGH (ref 1500–7800)
Neutrophils Relative %: 79.4 %
Platelets: 198 10*3/uL (ref 140–400)
RBC: 3.78 10*6/uL — ABNORMAL LOW (ref 3.80–5.10)
RDW: 12.3 % (ref 11.0–15.0)
Total Lymphocyte: 9.8 %
WBC: 12 10*3/uL — ABNORMAL HIGH (ref 3.8–10.8)

## 2019-05-08 LAB — URINALYSIS, ROUTINE W REFLEX MICROSCOPIC
Bilirubin Urine: NEGATIVE
Glucose, UA: NEGATIVE
Hgb urine dipstick: NEGATIVE
Ketones, ur: NEGATIVE
Leukocytes,Ua: NEGATIVE
Nitrite: NEGATIVE
Protein, ur: NEGATIVE
Specific Gravity, Urine: 1.006 (ref 1.001–1.03)
pH: 7.5 (ref 5.0–8.0)

## 2019-05-11 ENCOUNTER — Encounter: Payer: Self-pay | Admitting: Family Medicine

## 2019-05-15 ENCOUNTER — Encounter: Payer: Self-pay | Admitting: Family Medicine

## 2019-05-15 ENCOUNTER — Other Ambulatory Visit: Payer: Self-pay

## 2019-05-15 ENCOUNTER — Ambulatory Visit: Payer: Medicare PPO | Admitting: Family Medicine

## 2019-05-15 VITALS — BP 142/80 | HR 88 | Temp 96.8°F | Resp 16 | Ht 62.0 in | Wt 146.0 lb

## 2019-05-15 DIAGNOSIS — N289 Disorder of kidney and ureter, unspecified: Secondary | ICD-10-CM

## 2019-05-15 DIAGNOSIS — E242 Drug-induced Cushing's syndrome: Secondary | ICD-10-CM

## 2019-05-15 DIAGNOSIS — R601 Generalized edema: Secondary | ICD-10-CM

## 2019-05-15 LAB — URINALYSIS, ROUTINE W REFLEX MICROSCOPIC
Bacteria, UA: NONE SEEN /HPF
Bilirubin Urine: NEGATIVE
Glucose, UA: NEGATIVE
Hgb urine dipstick: NEGATIVE
Hyaline Cast: NONE SEEN /LPF
Ketones, ur: NEGATIVE
Leukocytes,Ua: NEGATIVE
Nitrite: NEGATIVE
RBC / HPF: NONE SEEN /HPF (ref 0–2)
Specific Gravity, Urine: 1.02 (ref 1.001–1.03)
WBC, UA: NONE SEEN /HPF (ref 0–5)
pH: 6.5 (ref 5.0–8.0)

## 2019-05-15 LAB — MICROSCOPIC MESSAGE

## 2019-05-15 NOTE — Progress Notes (Signed)
Subjective:    Patient ID: Ebony Scott, female    DOB: 08-27-1950, 69 y.o.   MRN: [id number]  HPI 03/18/18 Patient is here today for hospital discharge follow-up.  I have reviewed the discharge summary from the hospital.  Patient's lab work was significant for an elevated CRP as well as an elevated sed rate greater than 140.  Patient was lethargic with fevers.  As soon as she started prednisone, her fevers resolved and she hard to feel better.  She is now down to 30 mg a day of prednisone.  She is starting to have some mild pain in the joints of her wrist but otherwise is doing much better than the way she appeared at the last visit.  The question is why she having fevers of unknown origin.  Why is her sed rate and her CRP elevated.  She is seeing rheumatology who is done a battery of blood tests which are still pending.  My suspicion is some type of atypical polymyalgia rheumatica versus an underlying bone marrow abnormality.  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  She also reports a cough today.  There are bibasilar crackles on exam however this was present at the last visit I suspect may be atelectasis.  At that time, my plan was: Patient has fever of unknown origin.  Leading suspicion is an autoimmune disease however work-up thus far has been negative.  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  She is under the care of a rheumatologist and further blood work is pending.  At the present time we will make no adjustments in her prednisone and I will defer this to her rheumatologist.  However I still question why the patient developed a DVT initially last fall prompting all the complications that arose thereafter.  This was unprovoked.  This would raise the concern for hypercoagulable state.  She is also been having fever of unknown origin.  There is also been a substantial  drop in her hemoglobin from 15 down to 7.9 since the DVT.  Now during that time she has been started on anticoagulation however her initial stool study was negative for blood.  Work-up has showed a low iron however she has been on iron replacement now for several weeks and her hemoglobin has not improved.  In fact it is dropped further.  Therefore I am going to repeat a stool study today.  If there is blood in the stool, she will need a GI evaluation for blood loss causing her anemia.  If the stool study is negative, I will repeat an iron level.  If the iron level is now normal, I would recommend an oncology hematology consultation for anemia and to evaluate for possible underlying bone marrow abnormalities that may explain the patient's fever of unknown origin, diffuse joint and bone pain, hypercoagulable state, etc.  Patient states that the rheumatologist is going to schedule her for a PET scan as well if the initial lab work is normal.  He however he is waiting for the lab work to return prior to scheduling the PET scan.  Therefore I will be interested to see the results of the PET scan as well.  She does have bibasilar crackles today on her exam however I will obtain a chest x-ray as I believe this may be atelectasis.  If there is evidence of infiltrate, I  will treat the patient for possible hospital-acquired pneumonia  05/23/18 Most recent Hgb had increased from 9.7 to 10.9 in Feb. patient is due to recheck her CBC today.  She is still taking iron replacement.  She has now completed 6 months of anticoagulation for her DVT and PE.  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  This 1 occurred unprovoked.  Therefore again we had a discussion today about discontinuing her anticoagulant versus continuing lifelong anticoagulation.  Given the risk of developing a third DVT/PE in her lifetime, the patient elects to stay on lifetime anticoagulation at this point.  She denies any rectal bleeding.  She denies any  melena.  She denies any hemoptysis.  She is seeing rheumatology who is diagnosed her with an inflammatory rheumatoid arthritis despite normal lab studies.  However the patient had a profoundly elevated sedimentation rate and this is been the consensus diagnosis.  She has been gradually weaned down on prednisone and they have started methotrexate 2 weeks ago.  At the end of last year, the patient has significant elevations in her LFTs which may have been due to autoimmune inflammation possibly due to her underlying inflammatory syndrome.  However at that time we discontinued her statin.  We are due today to recheck a fasting lipid panel as she has been off her statin now for almost 3 months.  However we will need to monitor her liver function test closely prior to resuming any statin particular now that the patient is on methotrexate.  At that time, my plan was: Patient had elevated liver function test that were never truly determine what the source is.  She is off her statin.  I suspect that there may have been some autoimmune hepatitis possibly causing the elevated LFTs.  Therefore I will repeat a CMP today.  If her liver function test are stable or better on the methotrexate, I would consider resuming her statin particular if her LDL cholesterol is elevated.  I will monitor her anemia with a CBC and if her hemoglobin has normalized, we will discontinue iron.  I will also check her cholesterol as stated above.  Her thyroid was just checked in February and was normal.  I would repeat that in August.  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  Therefore we will monitor her hemoglobin closely as she will continue her anticoagulant indefinitely.  As stated in the history and physical, her wart on her right second toe was treated with liquid nitrogen cryotherapy.  07/04/18 After the patient's last visit, she was started on a  statin for her elevated cholesterol.  I had her return today so that we can recheck her cholesterol along with her liver function test given the fact she is also on methotrexate to monitor for any evidence of hepatitis.  She is tolerating the statin well without any myalgias.  However she reports a 3-week history of cough that is gradually getting worse and is now productive of yellow and green sputum.  She also reports chest congestion that has not improved since starting Mucinex and Zyrtec.  She denies any fevers.  She denies any body aches.  She has been quarantining at home and has no sick contacts that she is aware of.  At that time, my plan was: Check a CMP and a fasting lipid panel to monitor for any evidence of toxicity due to her statin.  Ideally I like  her LDL cholesterol to be below 100.  I am concerned the patient may have bronchitis given her compromised immune system on prednisone and methotrexate I will start the patient on a Z-Pak however it is most likely that this is viral or allergy related.  Therefore I recommended that she continue Mucinex and Zyrtec.  Recheck immediately if she develops fevers chills or worsening shortness of breath  05/01/19 Patient has been on prednisone now for almost 14 months.  She is having to take this for rheumatoid arthritis.  Her rheumatologist is try to wean her off several times without any success.  They have tried methotrexate and are now on biologic medications however the patient continues to require 15 to 70 mg of prednisone every day.  She presents today with significant swelling in her left leg but she also has swelling in her right leg.  She saw her pulmonologist this morning who ordered venous Dopplers to evaluate for any DVT.  The patient has a history of a DVT in her left veins in 2014 and a DVT in her right leg in 2019.  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  However today she has +1  pitting edema in her right leg and +1 pitting edema in her left leg up to her knees.  She also has a buffalo hump.  She has moon facies with significant rounding of the face compared to prior to taking prednisone.  She denies any chest pain.  Her shortness of breath is stable.  She has a history of chronic kidney disease but no evidence of renal failure.  At that time, my plan was: I believe this is most likely a side effect of her prednisone.  Patient has iatrogenic Cushing's syndrome based on physical exam.  Edema is also associated with this.  I will check a urinalysis to evaluate for any evidence of nephrotic syndrome.  I will check a BMP/CMP to evaluate for any evidence of renal failure or liver toxicity.  I will check a BNP to evaluate for any evidence of congestive heart failure although her last echocardiogram in 2019 showed mild diastolic dysfunction and no evidence of systolic failure.  If lab work is normal, I would treat the patient symptomatically with Lasix 40 mg a day as needed for swelling.  I explained this in detail and spent more than 25 minutes today with the patient outlining the causes of this and the treatment.  Patient is working with her rheumatologist to try to wean off prednisone.   05/15/19 Office Visit on 05/01/2019  Component Date Value Ref Range Status  . WBC 05/07/2019 12.0* 3.8 - 10.8 Thousand/uL Final  . RBC 05/07/2019 3.78* 3.80 - 5.10 Million/uL Final  . Hemoglobin 05/07/2019 13.0  11.7 - 15.5 g/dL Final  . HCT 05/07/2019 38.1  35.0 - 45.0 % Final  . MCV 05/07/2019 100.8* 80.0 - 100.0 fL Final  . MCH 05/07/2019 34.4* 27.0 - 33.0 pg Final  . MCHC 05/07/2019 34.1  32.0 - 36.0 g/dL Final  . RDW 05/07/2019 12.3  11.0 - 15.0 % Final  . Platelets 05/07/2019 198  140 - 400 Thousand/uL Final  . MPV 05/07/2019 11.1  7.5 - 12.5 fL Final  . Neutro Abs 05/07/2019 9,528* 1,500 - 7,800 cells/uL Final  . Lymphs Abs 05/07/2019 1,176  850 - 3,900 cells/uL Final  . Absolute  Monocytes 05/07/2019 1,164* 200 - 950 cells/uL Final  . Eosinophils Absolute 05/07/2019  84  15 - 500 cells/uL Final  . Basophils Absolute 05/07/2019 48  0 - 200 cells/uL Final  . Neutrophils Relative % 05/07/2019 79.4  % Final  . Total Lymphocyte 05/07/2019 9.8  % Final  . Monocytes Relative 05/07/2019 9.7  % Final  . Eosinophils Relative 05/07/2019 0.7  % Final  . Basophils Relative 05/07/2019 0.4  % Final  . Smear Review 05/07/2019    Final   Comment: Review of peripheral smear confirms automated results.   . Brain Natriuretic Peptide 05/07/2019 41  <100 pg/mL Final   Comment: . BNP levels increase with age in the general population with the highest values seen in individuals greater than 60 years of age. Reference: J. Am. [place] Ar. Cardiol. [id number]. .   . Glucose, Bld 05/07/2019 126* 65 - 99 mg/dL Final   Comment: .            Fasting reference interval . For someone without known diabetes, a glucose value >125 mg/dL indicates that they may have diabetes and this should be confirmed with a follow-up test. .   . BUN 05/07/2019 41* 7 - 25 mg/dL Final  . Creat 05/07/2019 1.67* 0.60 - 0.93 mg/dL Final   Comment: For patients >83 years of age, the reference limit for Creatinine is approximately 13% higher for people identified as African-American. .   . GFR, Est Non African American 05/07/2019 30* > OR = 60 mL/min/1.75m2 Final  . GFR, Est African American 05/07/2019 35* > OR = 60 mL/min/1.43m2 Final  . BUN/Creatinine Ratio 05/07/2019 25* 6 - 22 (calc) Final  . Sodium 05/07/2019 142  135 - 146 mmol/L Final  . Potassium 05/07/2019 3.9  3.5 - 5.3 mmol/L Final  . Chloride 05/07/2019 98  98 - 110 mmol/L Final  . CO2 05/07/2019 33* 20 - 32 mmol/L Final  . Calcium 05/07/2019 9.8  8.6 - 10.4 mg/dL Final  . Total Protein 05/07/2019 6.2  6.1 - 8.1 g/dL Final  . Albumin 05/07/2019 4.0  3.6 - 5.1 g/dL Final  . Globulin 05/07/2019 2.2  1.9 - 3.7 g/dL (calc) Final  . AG Ratio  05/07/2019 1.8  1.0 - 2.5 (calc) Final  . Total Bilirubin 05/07/2019 0.6  0.2 - 1.2 mg/dL Final  . Alkaline phosphatase (APISO) 05/07/2019 40  37 - 153 U/L Final  . AST 05/07/2019 37* 10 - 35 U/L Final  . ALT 05/07/2019 53* 6 - 29 U/L Final  . Color, Urine 05/07/2019 YELLOW  YELLOW Final  . APPearance 05/07/2019 CLEAR  CLEAR Final  . Specific Gravity, Urine 05/07/2019 1.006  1.001 - 1.03 Final  . pH 05/07/2019 7.5  5.0 - 8.0 Final  . Glucose, UA 05/07/2019 NEGATIVE  NEGATIVE Final  . Bilirubin Urine 05/07/2019 NEGATIVE  NEGATIVE Final  . Ketones, ur 05/07/2019 NEGATIVE  NEGATIVE Final  . Hgb urine dipstick 05/07/2019 NEGATIVE  NEGATIVE Final  . Protein, ur 05/07/2019 NEGATIVE  NEGATIVE Final  . Nitrite 05/07/2019 NEGATIVE  NEGATIVE Final  . Chalmers Guest 05/07/2019 NEGATIVE  NEGATIVE Final  Hospital Outpatient Visit on 04/18/2019  Component Date Value Ref Range Status  . SARS Coronavirus 2 04/18/2019 NEGATIVE  NEGATIVE Final   Comment: (NOTE) SARS-CoV-2 target nucleic acids are NOT DETECTED. The SARS-CoV-2 RNA is generally detectable in upper and lower respiratory specimens during the acute phase of infection. Negative results do not preclude SARS-CoV-2 infection, do not rule out co-infections with other pathogens, and should not be used as the sole basis for treatment or  other patient management decisions. Negative results must be combined with clinical observations, patient history, and epidemiological information. The expected result is Negative. Fact Sheet for Patients: SugarRoll.be Fact Sheet for Healthcare Providers: https://www.woods-mathews.com/ This test is not yet approved or cleared by the [place] FDA and  has been authorized for detection and/or diagnosis of SARS-CoV-2 by FDA under an Emergency Use Authorization (EUA). This EUA will remain  in effect (meaning this test can be used) for the duration of the COVID-19  declaration under Section 56                          4(b)(1) of the Act, 21 U.S.C. section 360bbb-3(b)(1), unless the authorization is terminated or revoked sooner. Performed at [facility], [place] [address] [place], [place] [address]    Wt Readings from Last 3 Encounters:  05/15/19 146 lb (66.2 kg)  05/01/19 149 lb (67.6 kg)  05/01/19 149 lb 9.6 oz (67.9 kg)   I performed lab work at the patient's last visit prior to starting her on Lasix 40 mg a day as needed.  The lab work showed that her creatinine had risen to 1.67.  Previously her creatinine has been hovering between 1.2 and 1.3 last year.  She denies taking any NSAIDs.  She denies any dysuria.  Urinalysis today aside from some protein shows no blood, leukocyte esterase, or nitrites.  Blood pressure is marginal but certainly reasonably well controlled.  There is no explanation for her decline in renal function.  We called the patient back last Friday and instructed her to decrease the dose of Lasix to 20 mg a day due to worsening kidney function however she called back earlier this week and wanted to go back on 40 mg a day as the swelling has returned and her legs.  On 40 mg a day of Lasix, the swelling is well controlled.  She does have some trace pretibial edema in her left ankle and left foot but this is mild.  Past Medical History:  Diagnosis Date  . Acute deep vein thrombosis (DVT) of right lower extremity ([place]) 12/13/2017  . Allergic rhinitis   . Allergy    SEASONAL  . Anemia   . Anxiety    pt denies  . Asthma   . Breast cancer ([place]) 1998   in remission  . Cataract    REMOVED  . Clostridium difficile colitis 01/14/2018  . Clostridium difficile diarrhea 01/14/2018  . Complication of anesthesia    "had hard time waking up from it several times" (02/20/2012)  . Depression    "some; don't take anything for it" (02/20/2012)  . Diverticulosis   . DVT (deep venous thrombosis) ([place])   . Exertional dyspnea   . Fibromyalgia  11/2011  . GERD (gastroesophageal reflux disease)   . Graves disease   . Headache(784.0)    "related to allergies; more at different times during the year" (02/20/2012)  . Hemorrhoids   . Hiatal hernia    back and neck  . Hx of adenomatous colonic polyps 04/12/2016  . Hypercholesteremia    good cholesterol is high  . Hypothyroidism   . IBS (irritable bowel syndrome)   . Moderate persistent asthma    -FeV1 72% 2011, -IgE 102 2011, CT sinus Neg 2011  . Osteoporosis    on reclast yearly  . Pneumonia 04/2011; ~ 11/2011   "double; single" (02/20/2012)  . Seronegative rheumatoid arthritis (H[facility])    Dr.  Govinda Aryal  . SIRS (systemic inflammatory response syndrome) ([place]) 02/10/2018  . Tracheobronchomalacia    Past Surgical History:  Procedure Laterality Date  . ANTERIOR AND POSTERIOR REPAIR  1990's  . APPENDECTOMY    . BREAST LUMPECTOMY  1998   left  . CARPOMETACARPEL ([place]) FUSION OF THUMB WITH AUTOGRAFT FROM RADIUS  ~ 2009   "both thumbs" (02/20/2012)  . CATARACT EXTRACTION W/ INTRAOCULAR LENS  IMPLANT, BILATERAL  2012  . CERVICAL DISCECTOMY  10/2001   C5-C6  . CERVICAL FUSION  2003   C3-C4  . CHOLECYSTECTOMY    . COLONOSCOPY    . DEBRIDEMENT TENNIS ELBOW  ?1970's   right  . ESOPHAGOGASTRODUODENOSCOPY    . HYSTERECTOMY    . KNEE ARTHROPLASTY  ?1990's   "?right; w/cartilage repair" (02/20/2012)  . NASAL SEPTUM SURGERY  1980's  . POSTERIOR CERVICAL FUSION/FORAMINOTOMY  2004   "failed initial fusion; rewired  anterior neck" (02/20/2012)  . TONSILLECTOMY  ~ 1953  . VESICOVAGINAL FISTULA CLOSURE W/ TAH  1988  . VIDEO BRONCHOSCOPY Bilateral 08/23/2016   Procedure: VIDEO BRONCHOSCOPY WITH FLUORO;  Surgeon: Javier Glazier, MD;  Location: [facility];  Service: Cardiopulmonary;  Laterality: Bilateral;   Current Outpatient Medications on File Prior to Visit  Medication Sig Dispense Refill  . acetaminophen (TYLENOL) 650 MG CR tablet Take 1,300 mg by mouth every 8 (eight) hours as needed  for pain.    Marland Kitchen albuterol (PROAIR HFA) 108 (90 Base) MCG/ACT inhaler Inhale 2 puffs into the lungs every 4 (four) hours as needed for wheezing or shortness of breath. 18 g 5  . Alpha-D-Galactosidase (BEANO PO) Take 1 capsule by mouth 3 (three) times daily as needed (gas/ bloating).     . Alpha-Lipoic Acid 600 MG CAPS Take 600 mg by mouth daily.     Marland Kitchen alum & mag hydroxide-simeth (MAALOX/MYLANTA) 200-200-20 MG/5ML suspension Take 15 mLs by mouth every 4 (four) hours as needed for indigestion or heartburn. 355 mL 0  . atorvastatin (LIPITOR) 10 MG tablet TAKE 1 TABLET BY MOUTH DAILY 90 tablet 3  . azelastine (ASTELIN) 0.1 % nasal spray USE 2 SPRAYS IN EACH NOSTRIL DAILY AS DIRECTED 30 mL 5  . Bacillus Coagulans-Inulin (ALIGN PREBIOTIC-PROBIOTIC PO) Take by mouth every evening.    Marland Kitchen CALCIUM-MAGNESIUM PO Take 1 tablet by mouth 2 (two) times daily.     . cetirizine (ZYRTEC) 10 MG tablet Take 5 mg by mouth every evening.     . cholecalciferol (VITAMIN D) 25 MCG (1000 UT) tablet Take 1,000 Units by mouth daily.     . cyproheptadine (PERIACTIN) 4 MG tablet Take 2 tablets (8 mg total) by mouth every evening. 180 tablet 2  . denosumab (PROLIA) 60 MG/ML SOLN injection Inject 60 mg into the skin every 6 (six) months. Administer in upper arm, thigh, or abdomen    . dexlansoprazole (DEXILANT) 60 MG capsule TAKE 1 CAPSULE BY MOUTH EACH MORNING 90 capsule 3  . diclofenac Sodium (VOLTAREN) 1 % GEL Apply 2 g topically 4 (four) times daily. 350 g 1  . EPINEPHrine (EPIPEN 2-PAK) 0.3 mg/0.3 mL IJ SOAJ injection Inject 0.3 mLs (0.3 mg total) into the muscle Once PRN. 0.3 mL 2  . famotidine (PEPCID) 20 MG tablet Take 1 tablet (20 mg total) by mouth 2 (two) times daily. 180 tablet 2  . ferrous sulfate 325 (65 FE) MG EC tablet Take 325 mg by mouth daily with breakfast.    . fluticasone (FLONASE) 50 MCG/ACT nasal spray  Place 2 sprays into both nostrils daily. 16 g 2  . furosemide (LASIX) 40 MG tablet Take 1 tablet (40 mg  total) by mouth daily as needed. 30 tablet 3  . gabapentin (NEURONTIN) 600 MG tablet Take 1 tablet (600 mg total) by mouth daily. 90 tablet 3  . Golimumab (SIMPONI [place]) Inject into the skin.    Marland Kitchen guaiFENesin (MUCINEX) 600 MG 12 hr tablet Take by mouth 2 (two) times daily.    . Lactase (LACTAID FAST ACT) 9000 units TABS Take 18,000 Units by mouth 4 (four) times daily as needed (dairy consumption).     Marland Kitchen leflunomide (ARAVA) 20 MG tablet Take 20 mg by mouth daily.    Marland Kitchen lidocaine (LIDODERM) 5 % Place 1 patch onto the skin daily as needed (pain). Remove & Discard patch within 12 hours or as directed by MD    . mirtazapine (REMERON SOL-TAB) 30 MG disintegrating tablet DISSOLVE 1 TABLET IN MOUTH EVERY NIGHT AT BEDTIME 90 tablet 1  . mometasone (NASONEX) 50 MCG/ACT nasal spray Place 2 sprays into the nose daily. 51 g 0  . mometasone-formoterol (DULERA) 200-5 MCG/ACT AERO Inhale 2 puffs into the lungs 2 (two) times daily. 39 g 1  . montelukast (SINGULAIR) 10 MG tablet TAKE 1 CAPSULE BY MOUTH DAILY 90 tablet 3  . mupirocin ointment (BACTROBAN) 2 % Place 1 application into the nose at bedtime. To prevent nose bleeds 90 g 1  . nystatin (MYCOSTATIN) 100000 UNIT/ML suspension Swish and spit 30mL's twice daily 473 mL 3  . nystatin-triamcinolone ointment (MYCOLOG) Apply topically 2 (two) times daily. 90 g 1  . predniSONE (DELTASONE) 5 MG tablet 10 mg.     . Prenatal Vit-Fe Fumarate-FA (PRENATAL MULTIVITAMIN) TABS tablet Take 1 tablet by mouth daily at 12 noon.    Marland Kitchen Propylene Glycol (SYSTANE COMPLETE) 0.6 % SOLN Apply to eye.    Marland Kitchen Respiratory Therapy Supplies (FLUTTER) DEVI Use as directed 1 each 0  . rivaroxaban (XARELTO) 20 MG TABS tablet TAKE 1 TABLET BY MOUTH DAILY WITH SUPPER 90 tablet 3  . sodium chloride HYPERTONIC 3 % nebulizer solution Take by nebulization daily. 750 mL 12  . Spacer/Aero-Holding Chambers (AEROCHAMBER PLUS) inhaler Use as instructed 1 each 0  . Tiotropium Bromide Monohydrate (SPIRIVA  RESPIMAT) 2.5 MCG/ACT AERS Inhale 1 puff into the lungs daily. 4 g 11   No current facility-administered medications on file prior to visit.   Allergies  Allergen Reactions  . Dust Mite Extract Shortness Of Breath and Other (See Comments)    "sneezing" (02/20/2012)  . Molds & Smuts Shortness Of Breath  . Morphine Sulfate Itching  . Other Shortness Of Breath and Other (See Comments)    Grass and weeds "sneezing; filled sinuses" (02/20/2012)  . Penicillins Rash and Other (See Comments)    "welts" (02/20/2012) Has patient had a PCN reaction causing immediate rash, facial/tongue/throat swelling, SOB or lightheadedness with hypotension: Unknown Has patient had a PCN reaction causing severe rash involving mucus membranes or skin necrosis: No Has patient had a PCN reaction that required hospitalization: No Has patient had a PCN reaction occurring within the last 10 years: No If all of the above answers are "NO", then may proceed with Cephalosporin use.   Marland Kitchen Reclast [Zoledronic Acid] Other (See Comments)    Fever, Put in hospital, dr said it was a reaction from a reaction   . Rofecoxib Swelling    Vioxx REACTION: feet swelling  . Shrimp Flavor Anaphylaxis  ALL SHELLFISH  . Tetracycline Hcl Nausea And Vomiting  . Xolair [Omalizumab] Other (See Comments)    Caused Blood clot  . Dilaudid [Hydromorphone Hcl] Itching  . Hydrocodone-Acetaminophen Nausea And Vomiting  . Levofloxacin Other (See Comments)    REACTION: GI upset  . Oxycodone Hcl Nausea And Vomiting  . Paroxetine Nausea And Vomiting    Paxil   . Celecoxib Swelling    Feet swelling  . Diltiazem Swelling  . Lactose Intolerance (Gi) Other (See Comments)    Bloating and gas  . Tree Extract Other (See Comments)    "tested and told I was allergic to it; never experienced a reaction to it" (02/20/2012)   Social History   Socioeconomic History  . Marital status: Divorced    Spouse name: Not on file  . Number of children: 2  . Years  of education: college  . Highest education level: Not on file  Occupational History  . Occupation: Disabled    Comment: Retired Engineer, production: RETIRED  Tobacco Use  . Smoking status: Passive Smoke Exposure - Never Smoker  . Smokeless tobacco: Never Used  . Tobacco comment: Parents  Substance and Sexual Activity  . Alcohol use: Not Currently    Alcohol/week: 0.0 standard drinks  . Drug use: No  . Sexual activity: Never  Other Topics Concern  . Not on file  Social History Narrative   Patient lives at home alone. Patient  divorced.    Patient has her BS degree.   Right handed.   Caffeine- sometimes coffee.      [facility]:   Born in [place], [place]. She worked as a Copywriter, advertising. She has no pets currently. She does have indoor plants. Previously had mold in her home that was remediated. Carpet was removed.          Social Determinants of Health   Financial Resource Strain:   . Difficulty of Paying Living Expenses:   Food Insecurity:   . Worried About Charity fundraiser in the Last Year:   . Arboriculturist in the Last Year:   Transportation Needs:   . Film/video editor (Medical):   Marland Kitchen Lack of Transportation (Non-Medical):   Physical Activity:   . Days of Exercise per Week:   . Minutes of Exercise per Session:   Stress:   . Feeling of Stress :   Social Connections:   . Frequency of Communication with Friends and Family:   . Frequency of Social Gatherings with Friends and Family:   . Attends Religious Services:   . Active Member of Clubs or Organizations:   . Attends Archivist Meetings:   Marland Kitchen Marital Status:   Intimate Partner Violence:   . Fear of Current or Ex-Partner:   . Emotionally Abused:   Marland Kitchen Physically Abused:   . Sexually Abused:      Review of Systems  All other systems reviewed and are negative.      Objective:   Physical Exam Vitals reviewed.  Constitutional:      General: She is not in acute  distress.    Appearance: Normal appearance. She is well-developed. She is not ill-appearing or toxic-appearing.  HENT:     Right Ear: Tympanic membrane and ear canal normal.     Left Ear: Tympanic membrane and ear canal normal.     Nose: No congestion or rhinorrhea.  Cardiovascular:     Rate and Rhythm: Normal rate and  regular rhythm.     Heart sounds: Normal heart sounds.  Pulmonary:     Effort: Pulmonary effort is normal. No respiratory distress.     Breath sounds: No stridor or decreased air movement. No wheezing or rales.  Abdominal:     General: Bowel sounds are normal. There is no distension.     Palpations: Abdomen is soft.     Tenderness: There is no abdominal tenderness. There is no guarding.  Musculoskeletal:     Right lower leg: Edema present.     Left lower leg: Edema present.  Skin:    Findings: No erythema.  Neurological:     Mental Status: She is alert and oriented to person, place, and time.     Cranial Nerves: No cranial nerve deficit.     Sensory: No sensory deficit.     Motor: No weakness.     Coordination: Coordination normal.   Moon facies, buffalo hump       Assessment & Plan:  Anasarca  Iatrogenic Cushing's syndrome (HCC)  Renal insufficiency - Plan: COMPLETE METABOLIC PANEL WITH GFR, Urinalysis, Routine w reflex microscopic  Patient's swelling seems to be well controlled on Lasix 40 mg a day however I am concerned about her renal insufficiency.  Repeat BMP today.  If liver function test are still in the 30-50 range, I would recommend that the patient resume her Lipitor and we monitor this more closely as she is on several medications that can aggravate the liver.  I did recommend that the patient stop several of her over-the-counter supplements that she takes just to avoid polypharmacy however the patient states that she stops any of these medications it upsets her digestive balance and causes bloating and diarrhea.  Urinalysis today does show some mild  proteinuria but otherwise is unremarkable.  If renal function remains elevated I would recommend a renal ultrasound, SPEP, UPEP, and a 24-hour urine protein collection however I suspect chronic kidney disease.  If her renal function is worsening, she will likely need to discontinue Lasix and switch to compression hose.

## 2019-05-16 ENCOUNTER — Other Ambulatory Visit: Payer: Self-pay | Admitting: Family Medicine

## 2019-05-16 LAB — COMPLETE METABOLIC PANEL WITH GFR
AG Ratio: 1.6 (calc) (ref 1.0–2.5)
ALT: 58 U/L — ABNORMAL HIGH (ref 6–29)
AST: 34 U/L (ref 10–35)
Albumin: 3.6 g/dL (ref 3.6–5.1)
Alkaline phosphatase (APISO): 42 U/L (ref 37–153)
BUN/Creatinine Ratio: 27 (calc) — ABNORMAL HIGH (ref 6–22)
BUN: 39 mg/dL — ABNORMAL HIGH (ref 7–25)
CO2: 26 mmol/L (ref 20–32)
Calcium: 9.6 mg/dL (ref 8.6–10.4)
Chloride: 106 mmol/L (ref 98–110)
Creat: 1.45 mg/dL — ABNORMAL HIGH (ref 0.60–0.93)
GFR, Est African American: 42 mL/min/{1.73_m2} — ABNORMAL LOW (ref >=60)
GFR, Est Non African American: 36 mL/min/{1.73_m2} — ABNORMAL LOW (ref >=60)
Globulin: 2.2 g/dL (calc) (ref 1.9–3.7)
Glucose, Bld: 94 mg/dL (ref 65–99)
Potassium: 3.7 mmol/L (ref 3.5–5.3)
Sodium: 146 mmol/L (ref 135–146)
Total Bilirubin: 0.4 mg/dL (ref 0.2–1.2)
Total Protein: 5.8 g/dL — ABNORMAL LOW (ref 6.1–8.1)

## 2019-05-18 ENCOUNTER — Other Ambulatory Visit: Payer: Self-pay | Admitting: Family Medicine

## 2019-05-18 DIAGNOSIS — N289 Disorder of kidney and ureter, unspecified: Secondary | ICD-10-CM

## 2019-05-20 NOTE — Telephone Encounter (Signed)
Per [place]:   Ebony Scott  You 12 minutes ago (9:14 AM)  SO There are no more cpap assistance programs and I don't know of any programs for the 02 sorry Ebony Scott   Message text

## 2019-05-20 NOTE — Telephone Encounter (Signed)
Hi Dr. Carlis Abbott, I was wondering if you could do anything to help encourage my insurance ([employer]) to cover part of the cost of my oxygen for my CPAP machine. I just found out that it will cost me an extra $27.50 just for the oxygen. That is on top of my cost for the CPAP and the Nebulizer.  Anything you can do would be greatly appreciated. Thanks and have very blessed day, Ebony Scott  PCC's can you please advise if there is anything we can do to help with her coverage? Any charity program? thanks

## 2019-05-21 ENCOUNTER — Other Ambulatory Visit: Payer: Self-pay

## 2019-05-21 ENCOUNTER — Ambulatory Visit (INDEPENDENT_AMBULATORY_CARE_PROVIDER_SITE_OTHER): Payer: Medicare PPO

## 2019-05-21 ENCOUNTER — Ambulatory Visit: Payer: Medicare PPO | Admitting: Podiatry

## 2019-05-21 DIAGNOSIS — M79675 Pain in left toe(s): Secondary | ICD-10-CM | POA: Diagnosis not present

## 2019-05-21 DIAGNOSIS — B351 Tinea unguium: Secondary | ICD-10-CM

## 2019-05-21 DIAGNOSIS — M79674 Pain in right toe(s): Secondary | ICD-10-CM

## 2019-05-21 DIAGNOSIS — S92301K Fracture of unspecified metatarsal bone(s), right foot, subsequent encounter for fracture with nonunion: Secondary | ICD-10-CM

## 2019-05-21 NOTE — Progress Notes (Addendum)
Subjective: 69 year old female presents the office for follow-up evaluation of fifth metatarsal fracture of the right foot.  She states that she is doing well and not having much pain. No swelling. Her hip has been sore over the last week but otherwise she is been doing well. Also asking for her nails be trimmed today as they are causing discomfort. She has no other concerns.  Denies any systemic complaints such as fevers, chills, nausea, vomiting. No acute changes since last appointment, and no other complaints at this time.   Objective: AAO x3, NAD DP/PT pulses palpable bilaterally, CRT less than 3 seconds No significant discomfort to palpation of the right foot on the fifth metatarsal. There is no edema. Peroneal tendon appears to be intact. No area of discomfort. Nails are mildly hypertrophic, dystrophic and elongated yellow discoloration. No drainage or pus or any signs of infection of the nail sites. Preulcerative area present on the right medial second toe without any skin breakdown. No pain with calf compression, swelling, warmth, erythema  Assessment: 69 year old female right fifth metatarsal fracture-nonunion; symptomatic onychomycosis  Plan: -All treatment options discussed with the patient including all alternatives, risks, complications.  -Repeat x-rays obtained reviewed of multiple views.  Radiolucent line still evident along the fifth metatarsal consistent with a nonunion with apparent fracture gap of approximately 2 to 3 mm.. -She is having hip pain, but will insert in surgical shoe. Also order a bone stimulator given the nonunion. -Nails x10 without any complications or bleeding as a courtesy.  Return in about 4 weeks (around 06/18/2019). Repeat x-rays right foot  Trula Slade DPM

## 2019-05-24 IMAGING — CR DG FOOT COMPLETE 3+V*L*
3 series · 3 of 3 positions shown · non-contrast
Comparison: 10/10/2016

CLINICAL DATA: Follow-up fifth metatarsal fracture

EXAM:
LEFT FOOT - COMPLETE 3+ VIEW

[t foot ap left]
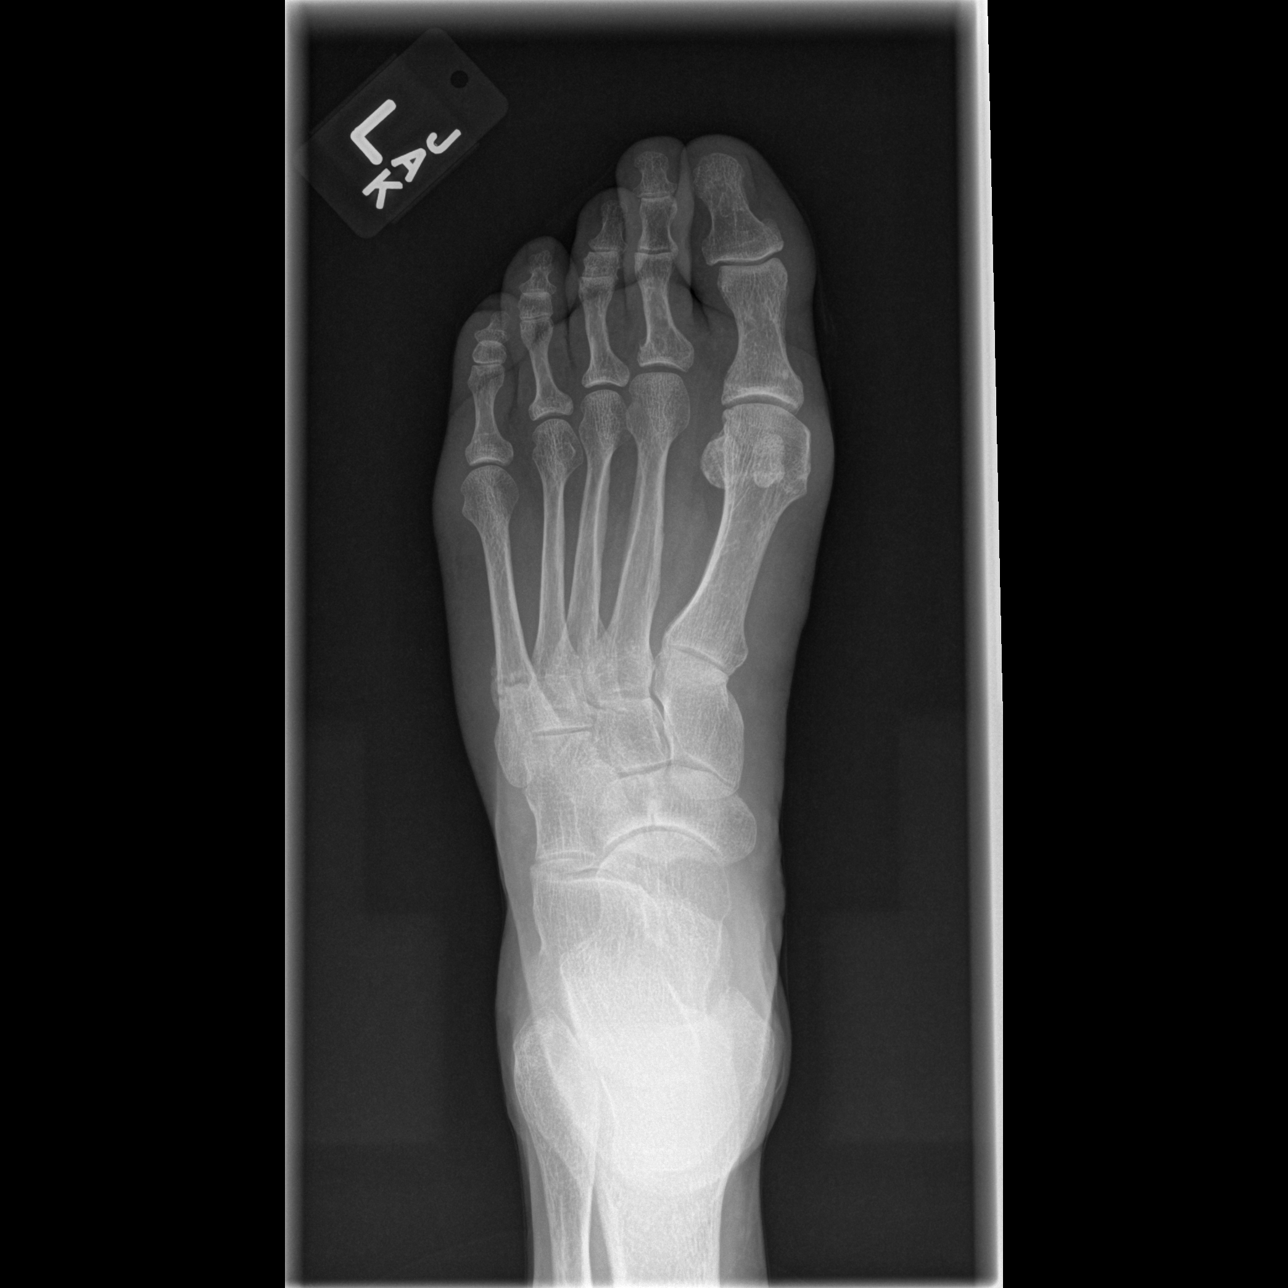

[t foot oblique left]
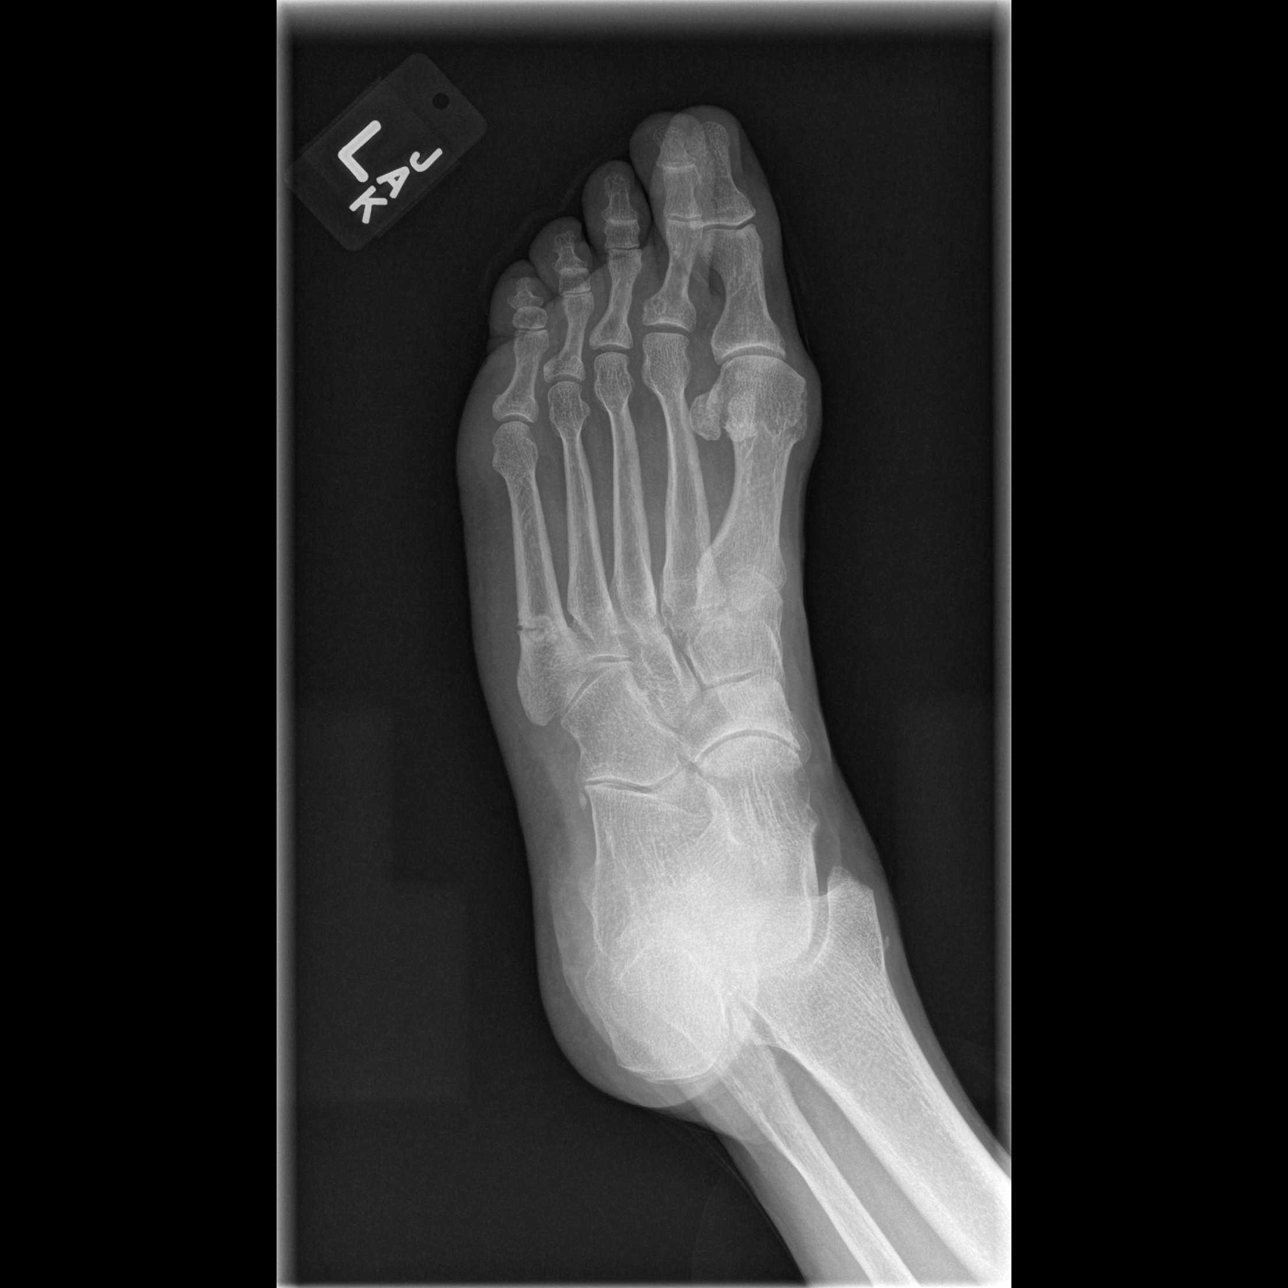

[t foot lat left]
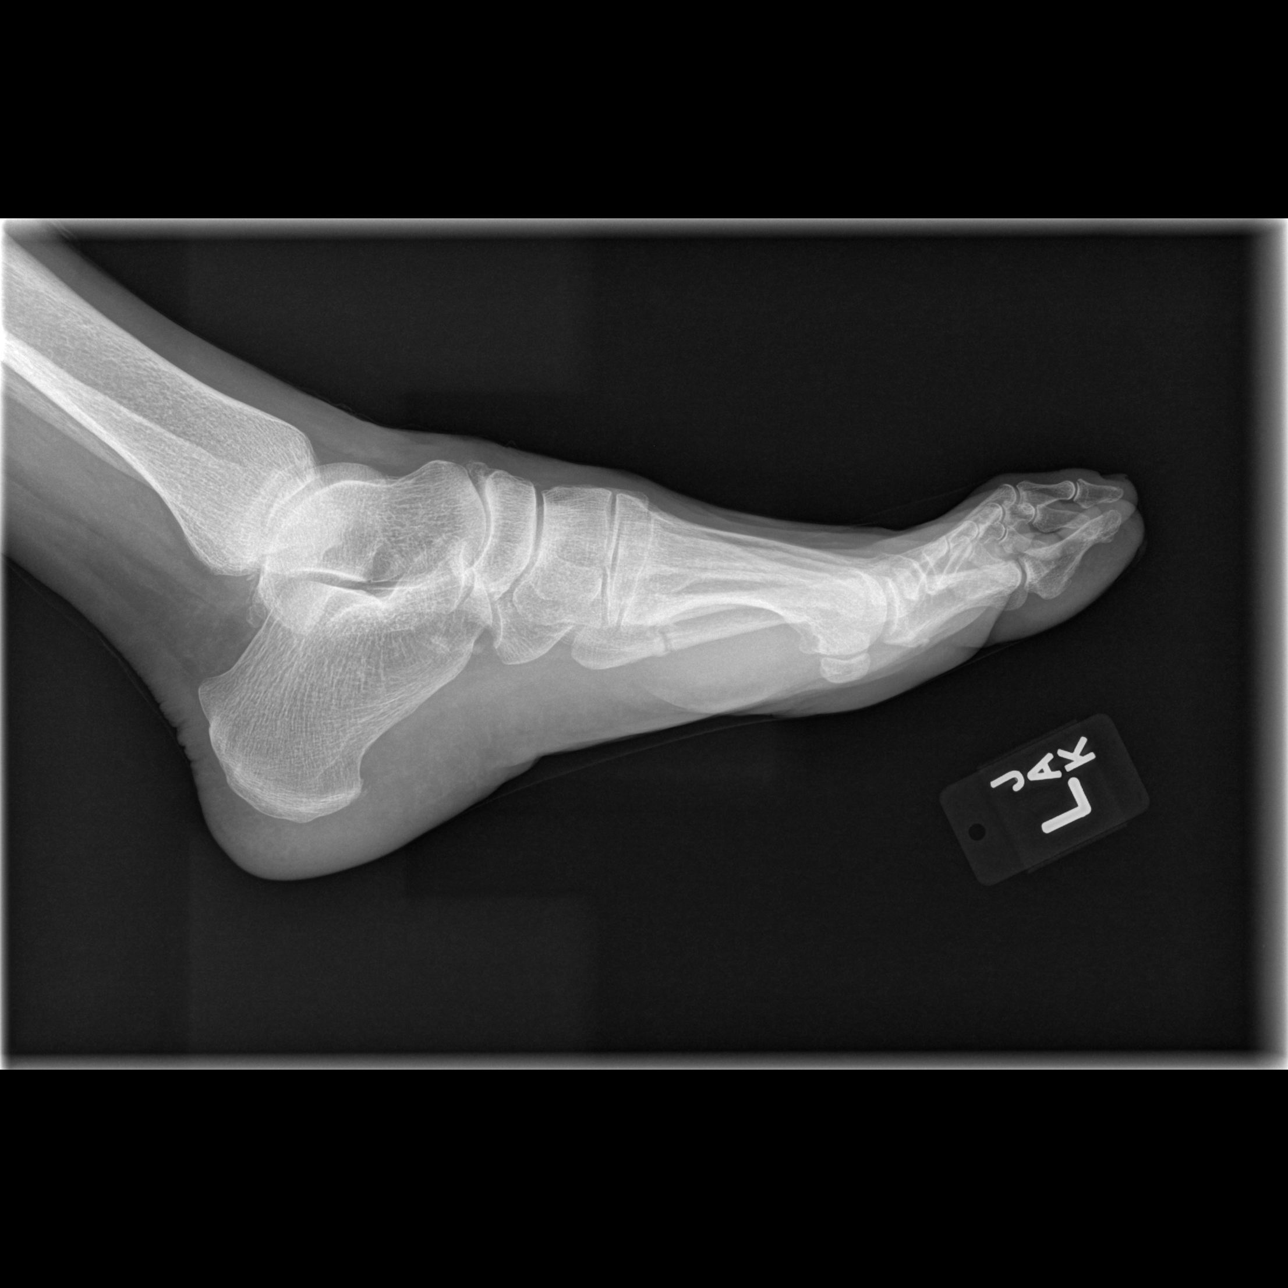

[3 of 3 positions shown; findings below may reference images not displayed]

FINDINGS: Continued healing of the previously seen fifth metatarsal fracture
is noted. No new focal abnormality is seen. No soft tissue changes
are noted.
IMPRESSION: Healing fifth metatarsal fracture

## 2019-05-28 ENCOUNTER — Encounter: Payer: Self-pay | Admitting: Podiatry

## 2019-05-28 DIAGNOSIS — J471 Bronchiectasis with (acute) exacerbation: Secondary | ICD-10-CM | POA: Diagnosis not present

## 2019-05-29 ENCOUNTER — Ambulatory Visit: Payer: Medicare PPO | Admitting: Critical Care Medicine

## 2019-05-30 DIAGNOSIS — G4733 Obstructive sleep apnea (adult) (pediatric): Secondary | ICD-10-CM | POA: Diagnosis not present

## 2019-06-03 ENCOUNTER — Encounter: Payer: Self-pay | Admitting: Critical Care Medicine

## 2019-06-03 ENCOUNTER — Ambulatory Visit (INDEPENDENT_AMBULATORY_CARE_PROVIDER_SITE_OTHER): Payer: Medicare PPO | Admitting: Critical Care Medicine

## 2019-06-03 ENCOUNTER — Other Ambulatory Visit: Payer: Self-pay

## 2019-06-03 DIAGNOSIS — Z86711 Personal history of pulmonary embolism: Secondary | ICD-10-CM

## 2019-06-03 DIAGNOSIS — J398 Other specified diseases of upper respiratory tract: Secondary | ICD-10-CM

## 2019-06-03 DIAGNOSIS — Z7901 Long term (current) use of anticoagulants: Secondary | ICD-10-CM | POA: Diagnosis not present

## 2019-06-03 DIAGNOSIS — J479 Bronchiectasis, uncomplicated: Secondary | ICD-10-CM

## 2019-06-03 DIAGNOSIS — Z77120 Contact with and (suspected) exposure to mold (toxic): Secondary | ICD-10-CM

## 2019-06-03 DIAGNOSIS — J4550 Severe persistent asthma, uncomplicated: Secondary | ICD-10-CM

## 2019-06-03 DIAGNOSIS — R0600 Dyspnea, unspecified: Secondary | ICD-10-CM | POA: Diagnosis not present

## 2019-06-03 DIAGNOSIS — Z7722 Contact with and (suspected) exposure to environmental tobacco smoke (acute) (chronic): Secondary | ICD-10-CM | POA: Diagnosis not present

## 2019-06-03 DIAGNOSIS — J9809 Other diseases of bronchus, not elsewhere classified: Secondary | ICD-10-CM | POA: Diagnosis not present

## 2019-06-03 DIAGNOSIS — R0609 Other forms of dyspnea: Secondary | ICD-10-CM

## 2019-06-03 MED ORDER — SODIUM CHLORIDE 3 % IN NEBU: INHALATION_SOLUTION | Freq: Two times a day (BID) | RESPIRATORY_TRACT | 12 refills | Status: DC

## 2019-06-03 NOTE — Patient Instructions (Addendum)
Thank you for visiting Dr. Carlis Abbott at [facility]. We recommend the following:  Increase saline nebulizers to two times daily with flutter valve.  Keep using air purifiers. Stay on asthma and allergy medications.  It is ok to use CPAP during the day if you are feeling more shortness of breath.  Meds ordered this encounter  Medications  . DISCONTD: sodium chloride HYPERTONIC 3 % nebulizer solution    Sig: Take by nebulization 2 (two) times daily.    Dispense:  750 mL    Refill:  12  . sodium chloride HYPERTONIC 3 % nebulizer solution    Sig: Take by nebulization 2 (two) times daily.    Dispense:  750 mL    Refill:  12    Return in about 4 weeks (around 07/01/2019).    Please do your part to reduce the spread of COVID-19.

## 2019-06-03 NOTE — Progress Notes (Signed)
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 recent PFTs her asthma is fairly well controlled, but the mold in her house recently could have triggered her symptoms in addition to pollen outside.  No evidence of RA- ILD on PFTs or CT scan.  All culture results from bronchoscopy reviewed again-- negative for chronic infection. -Continue triple inhaled therapy -Recommend increasing hypertonic saline to twice daily with flutter valve to help with secretion management.  Refills provided today. -Recommend using CPAP during the day if she is having more severe episodes of shortness of breath  Bronchiectasis & tracheobronchomalacia.  I am concerned that she may have a new worsening baseline after her recent exacerbation. -Increase nebulized hypertonic saline to twice daily with flutter valve  -Continue CPAP with supplemental oxygen at night.  Recommend using it during the day if she is having more severe shortness of breath. She will continue to follow up with Dr. Ander Slade in sleep medicine clinic  Severe persistent asthma; although PFTs have failed to demonstrate obstruction recently, her CT scan demonstrates air trapping and significant airway collapse consistent with  tracheobronchomalacia. Based on her PFTs not demonstrating obstruction, she is less likely to have obliterative bronchiolitis due to RA, which typically is seen in sero-positive RA. 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  No indication of uncontrolled bleeding. -Continue Xarelto indefinitely  Allergic rhinitis -Continue montelukast, Zyrtec  Not discussed today: Restrictive lung disease, suspect this may be at least due in part to her recent weight gain -Recommend continued physical activity as tolerated  GERD  -Continue PPI and Pepcid  Chronically immunosuppressed; no evidence of opportunistic infection on recent BAL 12/24/2018    RTC in 1 month.    Current Outpatient Medications:  .  acetaminophen (TYLENOL) 650 MG CR tablet, Take 1,300 mg by mouth every 8 (eight) hours as needed for pain., Disp: , Rfl:  .  albuterol (PROAIR HFA) 108 (90 Base) MCG/ACT inhaler, Inhale 2 puffs into the lungs every 4 (four) hours as needed for wheezing or shortness of breath., Disp: 18 g, Rfl: 5 .  Alpha-D-Galactosidase (BEANO PO), Take 1 capsule by mouth 3 (three) times daily as needed (gas/ bloating). , Disp: , Rfl:  .  Alpha-Lipoic Acid 600 MG CAPS, Take 600 mg by mouth daily. , Disp: , Rfl:  .  alum &  mag hydroxide-simeth (MAALOX/MYLANTA) 200-200-20 MG/5ML suspension, Take 15 mLs by mouth every 4 (four) hours as needed for indigestion or heartburn., Disp: 355 mL, Rfl: 0 .  atorvastatin (LIPITOR) 10 MG tablet, TAKE 1 TABLET BY MOUTH DAILY, Disp: 90 tablet, Rfl: 3 .  azelastine (ASTELIN) 0.1 % nasal spray, USE 2 SPRAYS IN EACH NOSTRIL DAILY AS DIRECTED, Disp: 30 mL, Rfl: 5 .  Bacillus Coagulans-Inulin (ALIGN PREBIOTIC-PROBIOTIC PO), Take by mouth every evening., Disp: , Rfl:  .  CALCIUM-MAGNESIUM PO, Take 1 tablet by mouth 2 (two)  times daily. , Disp: , Rfl:  .  cetirizine (ZYRTEC) 10 MG tablet, Take 5 mg by mouth every evening. , Disp: , Rfl:  .  cholecalciferol (VITAMIN D) 25 MCG (1000 UT) tablet, Take 1,000 Units by mouth daily. , Disp: , Rfl:  .  cyproheptadine (PERIACTIN) 4 MG tablet, Take 2 tablets (8 mg total) by mouth every evening., Disp: 180 tablet, Rfl: 2 .  denosumab (PROLIA) 60 MG/ML SOLN injection, Inject 60 mg into the skin every 6 (six) months. Administer in upper arm, thigh, or abdomen, Disp: , Rfl:  .  dexlansoprazole (DEXILANT) 60 MG capsule, TAKE 1 CAPSULE BY MOUTH EACH MORNING, Disp: 90 capsule, Rfl: 3 .  diclofenac Sodium (VOLTAREN) 1 % GEL, APPLY 2 GRAMS TOPICALLY TWICE DAILY, Disp: 100 g, Rfl: 1 .  EPINEPHrine (EPIPEN 2-PAK) 0.3 mg/0.3 mL IJ SOAJ injection, Inject 0.3 mLs (0.3 mg total) into the muscle Once PRN., Disp: 0.3 mL, Rfl: 2 .  famotidine (PEPCID) 20 MG tablet, Take 1 tablet (20 mg total) by mouth 2 (two) times daily., Disp: 180 tablet, Rfl: 2 .  ferrous sulfate 325 (65 FE) MG EC tablet, Take 325 mg by mouth daily with breakfast., Disp: , Rfl:  .  fluticasone (FLONASE) 50 MCG/ACT nasal spray, Place 2 sprays into both nostrils daily., Disp: 16 g, Rfl: 2 .  furosemide (LASIX) 40 MG tablet, Take 1 tablet (40 mg total) by mouth daily as needed., Disp: 30 tablet, Rfl: 3 .  gabapentin (NEURONTIN) 600 MG tablet, Take 1 tablet (600 mg total) by mouth daily., Disp: 90 tablet, Rfl: 3 .  Golimumab (SIMPONI [place]), Inject into the skin., Disp: , Rfl:  .  guaiFENesin (MUCINEX) 600 MG 12 hr tablet, Take by mouth 2 (two) times daily., Disp: , Rfl:  .  Lactase (LACTAID FAST ACT) 9000 units TABS, Take 18,000 Units by mouth 4 (four) times daily as needed (dairy consumption). , Disp: , Rfl:  .  leflunomide (ARAVA) 20 MG tablet, Take 20 mg by mouth daily., Disp: , Rfl:  .  lidocaine (LIDODERM) 5 %, Place 1 patch onto the skin daily as needed (pain). Remove & Discard patch within 12 hours or as directed by MD, Disp:  , Rfl:  .  mirtazapine (REMERON SOL-TAB) 30 MG disintegrating tablet, DISSOLVE 1 TABLET IN MOUTH EVERY NIGHT AT BEDTIME, Disp: 90 tablet, Rfl: 1 .  mometasone (NASONEX) 50 MCG/ACT nasal spray, Place 2 sprays into the nose daily., Disp: 51 g, Rfl: 0 .  mometasone-formoterol (DULERA) 200-5 MCG/ACT AERO, Inhale 2 puffs into the lungs 2 (two) times daily., Disp: 39 g, Rfl: 1 .  montelukast (SINGULAIR) 10 MG tablet, TAKE 1 CAPSULE BY MOUTH DAILY, Disp: 90 tablet, Rfl: 3 .  mupirocin ointment (BACTROBAN) 2 %, Place 1 application into the nose at bedtime. To prevent nose bleeds, Disp: 90 g, Rfl: 1 .  nystatin (MYCOSTATIN) 100000 UNIT/ML suspension, Swish and spit 77m's twice daily, Disp: 473 mL,  Rfl: 3 .  nystatin-triamcinolone ointment (MYCOLOG), Apply topically 2 (two) times daily., Disp: 90 g, Rfl: 1 .  predniSONE (DELTASONE) 5 MG tablet, 10 mg. , Disp: , Rfl:  .  Prenatal Vit-Fe Fumarate-FA (PRENATAL MULTIVITAMIN) TABS tablet, Take 1 tablet by mouth daily at 12 noon., Disp: , Rfl:  .  Propylene Glycol (SYSTANE COMPLETE) 0.6 % SOLN, Apply to eye., Disp: , Rfl:  .  Respiratory Therapy Supplies (FLUTTER) DEVI, Use as directed, Disp: 1 each, Rfl: 0 .  rivaroxaban (XARELTO) 20 MG TABS tablet, TAKE 1 TABLET BY MOUTH DAILY WITH SUPPER, Disp: 90 tablet, Rfl: 3 .  sodium chloride HYPERTONIC 3 % nebulizer solution, Take by nebulization daily., Disp: 750 mL, Rfl: 12 .  Spacer/Aero-Holding Chambers (AEROCHAMBER PLUS) inhaler, Use as instructed, Disp: 1 each, Rfl: 0 .  Tiotropium Bromide Monohydrate (SPIRIVA RESPIMAT) 2.5 MCG/ACT AERS, Inhale 1 puff into the lungs daily., Disp: 4 g, Rfl: 11   Julian Hy, DO [facility] 06/03/2019 10:01 AM

## 2019-06-11 DIAGNOSIS — M0609 Rheumatoid arthritis without rheumatoid factor, multiple sites: Secondary | ICD-10-CM | POA: Diagnosis not present

## 2019-06-12 DIAGNOSIS — S92301K Fracture of unspecified metatarsal bone(s), right foot, subsequent encounter for fracture with nonunion: Secondary | ICD-10-CM | POA: Diagnosis not present

## 2019-06-15 DIAGNOSIS — J45909 Unspecified asthma, uncomplicated: Secondary | ICD-10-CM | POA: Diagnosis not present

## 2019-06-15 DIAGNOSIS — G4733 Obstructive sleep apnea (adult) (pediatric): Secondary | ICD-10-CM | POA: Diagnosis not present

## 2019-06-15 DIAGNOSIS — I5032 Chronic diastolic (congestive) heart failure: Secondary | ICD-10-CM | POA: Diagnosis not present

## 2019-06-15 DIAGNOSIS — R0602 Shortness of breath: Secondary | ICD-10-CM | POA: Diagnosis not present

## 2019-06-15 DIAGNOSIS — G894 Chronic pain syndrome: Secondary | ICD-10-CM | POA: Diagnosis not present

## 2019-06-15 DIAGNOSIS — J4540 Moderate persistent asthma, uncomplicated: Secondary | ICD-10-CM | POA: Diagnosis not present

## 2019-06-15 DIAGNOSIS — G4736 Sleep related hypoventilation in conditions classified elsewhere: Secondary | ICD-10-CM | POA: Diagnosis not present

## 2019-06-15 DIAGNOSIS — M0600 Rheumatoid arthritis without rheumatoid factor, unspecified site: Secondary | ICD-10-CM | POA: Diagnosis not present

## 2019-06-15 DIAGNOSIS — J471 Bronchiectasis with (acute) exacerbation: Secondary | ICD-10-CM | POA: Diagnosis not present

## 2019-06-16 DIAGNOSIS — G4733 Obstructive sleep apnea (adult) (pediatric): Secondary | ICD-10-CM | POA: Diagnosis not present

## 2019-06-16 DIAGNOSIS — J471 Bronchiectasis with (acute) exacerbation: Secondary | ICD-10-CM | POA: Diagnosis not present

## 2019-06-17 ENCOUNTER — Telehealth: Payer: Self-pay | Admitting: Critical Care Medicine

## 2019-06-17 MED ORDER — ALBUTEROL SULFATE HFA 108 (90 BASE) MCG/ACT IN AERS: 2.0000 | INHALATION_SPRAY | RESPIRATORY_TRACT | 5 refills | Status: DC | PRN

## 2019-06-17 NOTE — Telephone Encounter (Signed)
Spoke with patient. She was requesting a refill of her ProAir inhaler to be sent to [facility]. I advised her that I would send this in. She verbalized understanding.   Nothing further needed at time of call.

## 2019-06-18 ENCOUNTER — Ambulatory Visit (INDEPENDENT_AMBULATORY_CARE_PROVIDER_SITE_OTHER): Payer: Medicare PPO

## 2019-06-18 ENCOUNTER — Other Ambulatory Visit: Payer: Medicare PPO

## 2019-06-18 ENCOUNTER — Other Ambulatory Visit: Payer: Self-pay

## 2019-06-18 ENCOUNTER — Ambulatory Visit: Payer: Medicare PPO | Admitting: Podiatry

## 2019-06-18 DIAGNOSIS — N289 Disorder of kidney and ureter, unspecified: Secondary | ICD-10-CM | POA: Diagnosis not present

## 2019-06-18 DIAGNOSIS — S92301K Fracture of unspecified metatarsal bone(s), right foot, subsequent encounter for fracture with nonunion: Secondary | ICD-10-CM

## 2019-06-18 DIAGNOSIS — M79675 Pain in left toe(s): Secondary | ICD-10-CM

## 2019-06-18 DIAGNOSIS — B351 Tinea unguium: Secondary | ICD-10-CM | POA: Diagnosis not present

## 2019-06-18 DIAGNOSIS — M79674 Pain in right toe(s): Secondary | ICD-10-CM | POA: Diagnosis not present

## 2019-06-18 LAB — COMPREHENSIVE METABOLIC PANEL
AG Ratio: 1.7 (calc) (ref 1.0–2.5)
ALT: 53 U/L — ABNORMAL HIGH (ref 6–29)
AST: 36 U/L — ABNORMAL HIGH (ref 10–35)
Albumin: 3.8 g/dL (ref 3.6–5.1)
Alkaline phosphatase (APISO): 43 U/L (ref 37–153)
BUN/Creatinine Ratio: 27 (calc) — ABNORMAL HIGH (ref 6–22)
BUN: 47 mg/dL — ABNORMAL HIGH (ref 7–25)
CO2: 27 mmol/L (ref 20–32)
Calcium: 9.3 mg/dL (ref 8.6–10.4)
Chloride: 101 mmol/L (ref 98–110)
Creat: 1.76 mg/dL — ABNORMAL HIGH (ref 0.60–0.93)
Globulin: 2.2 g/dL (calc) (ref 1.9–3.7)
Glucose, Bld: 159 mg/dL — ABNORMAL HIGH (ref 65–99)
Potassium: 3.7 mmol/L (ref 3.5–5.3)
Sodium: 141 mmol/L (ref 135–146)
Total Bilirubin: 0.5 mg/dL (ref 0.2–1.2)
Total Protein: 6 g/dL — ABNORMAL LOW (ref 6.1–8.1)

## 2019-06-18 NOTE — Progress Notes (Signed)
Subjective: 69 year old female presents the office for follow-up evaluation of fifth metatarsal fracture of the right foot and asking for nail to be trimmed today.  She has started using a bone stimulator.  Is no change in discomfort but overall doing well.  She does have swelling to both of her extremities and she has been followed by her primary care physician as well as rheumatologist for this.  There is no pain associated with the swelling.  Denies any systemic complaints such as fevers, chills, nausea, vomiting. No acute changes since last appointment, and no other complaints at this time.   Objective: AAO x3, NAD DP/PT pulses palpable bilaterally, CRT less than 3 seconds No significant discomfort to palpation of the right foot on the fifth metatarsal.  No pain with course the peroneal tendon.  Tendons appear to be intact.  There is edema present bilaterally but there is no erythema or warmth. Nails are hypertrophic, dystrophic, brittle, discolored, elongated 10. No surrounding redness or drainage. Tenderness nails 1-5 bilaterally. No open lesions or pre-ulcerative lesions are identified today. No pain with calf compression, swelling, warmth, erythema  Assessment: 69 year old female right fifth metatarsal fracture-nonunion; symptomatic onychomycosis  Plan: -All treatment options discussed with the patient including all alternatives, risks, complications.  -Repeat x-rays obtained reviewed of multiple views.  Radiolucent line still evident along the fifth metatarsal consistent with a nonunion however there does appear to be some mild consolidation compared to last appointment. -Continue bone stimulator and continue offloading shoe. -Nails x10 without any complications or bleeding as a courtesy.  Return in about 6 weeks (around 07/30/2019).  Trula Slade DPM

## 2019-06-22 ENCOUNTER — Telehealth: Payer: Self-pay | Admitting: Critical Care Medicine

## 2019-06-22 NOTE — Telephone Encounter (Signed)
Medication name and strength: Hypertonic Saline Solution 3% Provider: [facility] Pharmacy: [facility] Patient insurance ID:  [id number] Phone: [phone] Fax:   Was the PA started on CMM?  Yes If yes, please enter the Key: [id number] Timeframe for approval/denial: Unknown at time of PA

## 2019-06-25 ENCOUNTER — Telehealth: Payer: Self-pay | Admitting: Family Medicine

## 2019-06-25 NOTE — Telephone Encounter (Signed)
I want to see her to discuss renal fcn.

## 2019-06-25 NOTE — Telephone Encounter (Signed)
Checked [email or web address]. PA has been approved from January 1st, 2021 to December 31st, 2021.   [place] and spoke with Ebony Scott. She is aware of the approval.   Nothing further needed.

## 2019-06-25 NOTE — Telephone Encounter (Signed)
#   [id number] Still taking Lasix not helping feet still swollen

## 2019-06-26 ENCOUNTER — Ambulatory Visit: Payer: Medicare PPO | Admitting: Family Medicine

## 2019-06-26 ENCOUNTER — Other Ambulatory Visit: Payer: Self-pay

## 2019-06-26 ENCOUNTER — Encounter: Payer: Self-pay | Admitting: Family Medicine

## 2019-06-26 VITALS — BP 130/62 | HR 90 | Temp 96.8°F | Resp 20 | Ht 62.0 in | Wt 150.0 lb

## 2019-06-26 DIAGNOSIS — N289 Disorder of kidney and ureter, unspecified: Secondary | ICD-10-CM

## 2019-06-26 MED ORDER — DICLOFENAC SODIUM 1 % EX GEL: CUTANEOUS | 1 refills | Status: DC

## 2019-06-26 NOTE — Progress Notes (Signed)
Subjective:    Patient ID: Ebony Scott, female    DOB: 08-27-1950, 69 y.o.   MRN: [id number]  HPI 03/18/18 Patient is here today for hospital discharge follow-up.  I have reviewed the discharge summary from the hospital.  Patient's lab work was significant for an elevated CRP as well as an elevated sed rate greater than 140.  Patient was lethargic with fevers.  As soon as she started prednisone, her fevers resolved and she hard to feel better.  She is now down to 30 mg a day of prednisone.  She is starting to have some mild pain in the joints of her wrist but otherwise is doing much better than the way she appeared at the last visit.  The question is why she having fevers of unknown origin.  Why is her sed rate and her CRP elevated.  She is seeing rheumatology who is done a battery of blood tests which are still pending.  My suspicion is some type of atypical polymyalgia rheumatica versus an underlying bone marrow abnormality.  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  She also reports a cough today.  There are bibasilar crackles on exam however this was present at the last visit I suspect may be atelectasis.  At that time, my plan was: Patient has fever of unknown origin.  Leading suspicion is an autoimmune disease however work-up thus far has been negative.  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  She is under the care of a rheumatologist and further blood work is pending.  At the present time we will make no adjustments in her prednisone and I will defer this to her rheumatologist.  However I still question why the patient developed a DVT initially last fall prompting all the complications that arose thereafter.  This was unprovoked.  This would raise the concern for hypercoagulable state.  She is also been having fever of unknown origin.  There is also been a substantial  drop in her hemoglobin from 15 down to 7.9 since the DVT.  Now during that time she has been started on anticoagulation however her initial stool study was negative for blood.  Work-up has showed a low iron however she has been on iron replacement now for several weeks and her hemoglobin has not improved.  In fact it is dropped further.  Therefore I am going to repeat a stool study today.  If there is blood in the stool, she will need a GI evaluation for blood loss causing her anemia.  If the stool study is negative, I will repeat an iron level.  If the iron level is now normal, I would recommend an oncology hematology consultation for anemia and to evaluate for possible underlying bone marrow abnormalities that may explain the patient's fever of unknown origin, diffuse joint and bone pain, hypercoagulable state, etc.  Patient states that the rheumatologist is going to schedule her for a PET scan as well if the initial lab work is normal.  He however he is waiting for the lab work to return prior to scheduling the PET scan.  Therefore I will be interested to see the results of the PET scan as well.  She does have bibasilar crackles today on her exam however I will obtain a chest x-ray as I believe this may be atelectasis.  If there is evidence of infiltrate, I  will treat the patient for possible hospital-acquired pneumonia  05/23/18 Most recent Hgb had increased from 9.7 to 10.9 in Feb. patient is due to recheck her CBC today.  She is still taking iron replacement.  She has now completed 6 months of anticoagulation for her DVT and PE.  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  This 1 occurred unprovoked.  Therefore again we had a discussion today about discontinuing her anticoagulant versus continuing lifelong anticoagulation.  Given the risk of developing a third DVT/PE in her lifetime, the patient elects to stay on lifetime anticoagulation at this point.  She denies any rectal bleeding.  She denies any  melena.  She denies any hemoptysis.  She is seeing rheumatology who is diagnosed her with an inflammatory rheumatoid arthritis despite normal lab studies.  However the patient had a profoundly elevated sedimentation rate and this is been the consensus diagnosis.  She has been gradually weaned down on prednisone and they have started methotrexate 2 weeks ago.  At the end of last year, the patient has significant elevations in her LFTs which may have been due to autoimmune inflammation possibly due to her underlying inflammatory syndrome.  However at that time we discontinued her statin.  We are due today to recheck a fasting lipid panel as she has been off her statin now for almost 3 months.  However we will need to monitor her liver function test closely prior to resuming any statin particular now that the patient is on methotrexate.  At that time, my plan was: Patient had elevated liver function test that were never truly determine what the source is.  She is off her statin.  I suspect that there may have been some autoimmune hepatitis possibly causing the elevated LFTs.  Therefore I will repeat a CMP today.  If her liver function test are stable or better on the methotrexate, I would consider resuming her statin particular if her LDL cholesterol is elevated.  I will monitor her anemia with a CBC and if her hemoglobin has normalized, we will discontinue iron.  I will also check her cholesterol as stated above.  Her thyroid was just checked in February and was normal.  I would repeat that in August.  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  Therefore we will monitor her hemoglobin closely as she will continue her anticoagulant indefinitely.  As stated in the history and physical, her wart on her right second toe was treated with liquid nitrogen cryotherapy.  07/04/18 After the patient's last visit, she was started on a  statin for her elevated cholesterol.  I had her return today so that we can recheck her cholesterol along with her liver function test given the fact she is also on methotrexate to monitor for any evidence of hepatitis.  She is tolerating the statin well without any myalgias.  However she reports a 3-week history of cough that is gradually getting worse and is now productive of yellow and green sputum.  She also reports chest congestion that has not improved since starting Mucinex and Zyrtec.  She denies any fevers.  She denies any body aches.  She has been quarantining at home and has no sick contacts that she is aware of.  At that time, my plan was: Check a CMP and a fasting lipid panel to monitor for any evidence of toxicity due to her statin.  Ideally I like  her LDL cholesterol to be below 100.  I am concerned the patient may have bronchitis given her compromised immune system on prednisone and methotrexate I will start the patient on a Z-Pak however it is most likely that this is viral or allergy related.  Therefore I recommended that she continue Mucinex and Zyrtec.  Recheck immediately if she develops fevers chills or worsening shortness of breath  05/01/19 Patient has been on prednisone now for almost 14 months.  She is having to take this for rheumatoid arthritis.  Her rheumatologist is try to wean her off several times without any success.  They have tried methotrexate and are now on biologic medications however the patient continues to require 15 to 70 mg of prednisone every day.  She presents today with significant swelling in her left leg but she also has swelling in her right leg.  She saw her pulmonologist this morning who ordered venous Dopplers to evaluate for any DVT.  The patient has a history of a DVT in her left veins in 2014 and a DVT in her right leg in 2019.  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  However today she has +1  pitting edema in her right leg and +1 pitting edema in her left leg up to her knees.  She also has a buffalo hump.  She has moon facies with significant rounding of the face compared to prior to taking prednisone.  She denies any chest pain.  Her shortness of breath is stable.  She has a history of chronic kidney disease but no evidence of renal failure.  At that time, my plan was: I believe this is most likely a side effect of her prednisone.  Patient has iatrogenic Cushing's syndrome based on physical exam.  Edema is also associated with this.  I will check a urinalysis to evaluate for any evidence of nephrotic syndrome.  I will check a BMP/CMP to evaluate for any evidence of renal failure or liver toxicity.  I will check a BNP to evaluate for any evidence of congestive heart failure although her last echocardiogram in 2019 showed mild diastolic dysfunction and no evidence of systolic failure.  If lab work is normal, I would treat the patient symptomatically with Lasix 40 mg a day as needed for swelling.  I explained this in detail and spent more than 25 minutes today with the patient outlining the causes of this and the treatment.  Patient is working with her rheumatologist to try to wean off prednisone.   05/15/19 Appointment on 06/18/2019  Component Date Value Ref Range Status  . Glucose, Bld 06/18/2019 159* 65 - 99 mg/dL Final   Comment: .            Fasting reference interval . For someone without known diabetes, a glucose value >125 mg/dL indicates that they may have diabetes and this should be confirmed with a follow-up test. .   . BUN 06/18/2019 47* 7 - 25 mg/dL Final  . Creat 06/18/2019 1.76* 0.60 - 0.93 mg/dL Final   Comment: For patients >41 years of age, the reference limit for Creatinine is approximately 13% higher for people identified as African-American. .   Havery Moros Ratio 06/18/2019 27* 6 - 22 (calc) Final  . Sodium 06/18/2019 141  135 - 146 mmol/L Final  .  Potassium 06/18/2019 3.7  3.5 - 5.3 mmol/L Final  . Chloride 06/18/2019 101  98 - 110 mmol/L Final  .  CO2 06/18/2019 27  20 - 32 mmol/L Final  . Calcium 06/18/2019 9.3  8.6 - 10.4 mg/dL Final  . Total Protein 06/18/2019 6.0* 6.1 - 8.1 g/dL Final  . Albumin 06/18/2019 3.8  3.6 - 5.1 g/dL Final  . Globulin 06/18/2019 2.2  1.9 - 3.7 g/dL (calc) Final  . AG Ratio 06/18/2019 1.7  1.0 - 2.5 (calc) Final  . Total Bilirubin 06/18/2019 0.5  0.2 - 1.2 mg/dL Final  . Alkaline phosphatase (APISO) 06/18/2019 43  37 - 153 U/L Final  . AST 06/18/2019 36* 10 - 35 U/L Final  . ALT 06/18/2019 53* 6 - 29 U/L Final   Wt Readings from Last 3 Encounters:  06/26/19 150 lb (68 kg)  05/15/19 146 lb (66.2 kg)  05/01/19 149 lb (67.6 kg)   I performed lab work at the patient's last visit prior to starting her on Lasix 40 mg a day as needed.  The lab work showed that her creatinine had risen to 1.67.  Previously her creatinine has been hovering between 1.2 and 1.3 last year.  She denies taking any NSAIDs.  She denies any dysuria.  Urinalysis today aside from some protein shows no blood, leukocyte esterase, or nitrites.  Blood pressure is marginal but certainly reasonably well controlled.  There is no explanation for her decline in renal function.  We called the patient back last Friday and instructed her to decrease the dose of Lasix to 20 mg a day due to worsening kidney function however she called back earlier this week and wanted to go back on 40 mg a day as the swelling has returned and her legs.  On 40 mg a day of Lasix, the swelling is well controlled.  She does have some trace pretibial edema in her left ankle and left foot but this is mild.  At that time, my plan was: Patient's swelling seems to be well controlled on Lasix 40 mg a day however I am concerned about her renal insufficiency.  Repeat BMP today.  If liver function test are still in the 30-50 range, I would recommend that the patient resume her Lipitor and  we monitor this more closely as she is on several medications that can aggravate the liver.  I did recommend that the patient stop several of her over-the-counter supplements that she takes just to avoid polypharmacy however the patient states that she stops any of these medications it upsets her digestive balance and causes bloating and diarrhea.  Urinalysis today does show some mild proteinuria but otherwise is unremarkable.  If renal function remains elevated I would recommend a renal ultrasound, SPEP, UPEP, and a 24-hour urine protein collection however I suspect chronic kidney disease.  If her renal function is worsening, she will likely need to discontinue Lasix and switch to compression hose.  06/26/19 Recently, the patient's creatinine rose from 1.46 to greater than 1.7.  She is here today to discuss.  I recommended reducing her Lasix to 20 mg a day.  This causes her swelling to increase and she wants to increase the dose however I am concerned about her worsening renal function.  Past Medical History:  Diagnosis Date  . Acute deep vein thrombosis (DVT) of right lower extremity ([place]) 12/13/2017  . Allergic rhinitis   . Allergy    SEASONAL  . Anemia   . Anxiety    pt denies  . Asthma   . Breast cancer ([place]) 1998   in remission  . Cataract  REMOVED  . Clostridium difficile colitis 01/14/2018  . Clostridium difficile diarrhea 01/14/2018  . Complication of anesthesia    "had hard time waking up from it several times" (02/20/2012)  . Depression    "some; don't take anything for it" (02/20/2012)  . Diverticulosis   . DVT (deep venous thrombosis) ([place])   . Exertional dyspnea   . Fibromyalgia 11/2011  . GERD (gastroesophageal reflux disease)   . Graves disease   . Headache(784.0)    "related to allergies; more at different times during the year" (02/20/2012)  . Hemorrhoids   . Hiatal hernia    back and neck  . Hx of adenomatous colonic polyps 04/12/2016  . Hypercholesteremia    good  cholesterol is high  . Hypothyroidism   . IBS (irritable bowel syndrome)   . Moderate persistent asthma    -FeV1 72% 2011, -IgE 102 2011, CT sinus Neg 2011  . Osteoporosis    on reclast yearly  . Pneumonia 04/2011; ~ 11/2011   "double; single" (02/20/2012)  . Seronegative rheumatoid arthritis ([place])    Dr. Lahoma Rocker  . SIRS (systemic inflammatory response syndrome) ([place]) 02/10/2018  . Tracheobronchomalacia    Past Surgical History:  Procedure Laterality Date  . ANTERIOR AND POSTERIOR REPAIR  1990's  . APPENDECTOMY    . BREAST LUMPECTOMY  1998   left  . CARPOMETACARPEL ([place]) FUSION OF THUMB WITH AUTOGRAFT FROM RADIUS  ~ 2009   "both thumbs" (02/20/2012)  . CATARACT EXTRACTION W/ INTRAOCULAR LENS  IMPLANT, BILATERAL  2012  . CERVICAL DISCECTOMY  10/2001   C5-C6  . CERVICAL FUSION  2003   C3-C4  . CHOLECYSTECTOMY    . COLONOSCOPY    . DEBRIDEMENT TENNIS ELBOW  ?1970's   right  . ESOPHAGOGASTRODUODENOSCOPY    . HYSTERECTOMY    . KNEE ARTHROPLASTY  ?1990's   "?right; w/cartilage repair" (02/20/2012)  . NASAL SEPTUM SURGERY  1980's  . POSTERIOR CERVICAL FUSION/FORAMINOTOMY  2004   "failed initial fusion; rewired  anterior neck" (02/20/2012)  . TONSILLECTOMY  ~ 1953  . VESICOVAGINAL FISTULA CLOSURE W/ TAH  1988  . VIDEO BRONCHOSCOPY Bilateral 08/23/2016   Procedure: VIDEO BRONCHOSCOPY WITH FLUORO;  Surgeon: Javier Glazier, MD;  Location: [facility];  Service: Cardiopulmonary;  Laterality: Bilateral;   Current Outpatient Medications on File Prior to Visit  Medication Sig Dispense Refill  . acetaminophen (TYLENOL) 650 MG CR tablet Take 1,300 mg by mouth every 8 (eight) hours as needed for pain.    Marland Kitchen albuterol (PROAIR HFA) 108 (90 Base) MCG/ACT inhaler Inhale 2 puffs into the lungs every 4 (four) hours as needed for wheezing or shortness of breath. 18 g 5  . Alpha-D-Galactosidase (BEANO PO) Take 1 capsule by mouth 3 (three) times daily as needed (gas/ bloating).     . Alpha-Lipoic  Acid 600 MG CAPS Take 600 mg by mouth daily.     Marland Kitchen alum & mag hydroxide-simeth (MAALOX/MYLANTA) 200-200-20 MG/5ML suspension Take 15 mLs by mouth every 4 (four) hours as needed for indigestion or heartburn. 355 mL 0  . atorvastatin (LIPITOR) 10 MG tablet TAKE 1 TABLET BY MOUTH DAILY 90 tablet 3  . azelastine (ASTELIN) 0.1 % nasal spray USE 2 SPRAYS IN EACH NOSTRIL DAILY AS DIRECTED 30 mL 5  . Bacillus Coagulans-Inulin (ALIGN PREBIOTIC-PROBIOTIC PO) Take by mouth every evening.    Marland Kitchen CALCIUM-MAGNESIUM PO Take 1 tablet by mouth 2 (two) times daily.     . cetirizine (ZYRTEC) 10 MG tablet Take  5 mg by mouth every evening.     . cholecalciferol (VITAMIN D) 25 MCG (1000 UT) tablet Take 1,000 Units by mouth daily.     . cyproheptadine (PERIACTIN) 4 MG tablet Take 2 tablets (8 mg total) by mouth every evening. 180 tablet 2  . denosumab (PROLIA) 60 MG/ML SOLN injection Inject 60 mg into the skin every 6 (six) months. Administer in upper arm, thigh, or abdomen    . dexlansoprazole (DEXILANT) 60 MG capsule TAKE 1 CAPSULE BY MOUTH EACH MORNING 90 capsule 3  . EPINEPHrine (EPIPEN 2-PAK) 0.3 mg/0.3 mL IJ SOAJ injection Inject 0.3 mLs (0.3 mg total) into the muscle Once PRN. 0.3 mL 2  . famotidine (PEPCID) 20 MG tablet Take 1 tablet (20 mg total) by mouth 2 (two) times daily. 180 tablet 2  . ferrous sulfate 325 (65 FE) MG EC tablet Take 325 mg by mouth daily with breakfast.    . fluticasone (FLONASE) 50 MCG/ACT nasal spray Place 2 sprays into both nostrils daily. 16 g 2  . furosemide (LASIX) 40 MG tablet Take 1 tablet (40 mg total) by mouth daily as needed. 30 tablet 3  . gabapentin (NEURONTIN) 600 MG tablet Take 1 tablet (600 mg total) by mouth daily. 90 tablet 3  . guaiFENesin (MUCINEX) 600 MG 12 hr tablet Take by mouth 2 (two) times daily.    . Lactase (LACTAID FAST ACT) 9000 units TABS Take 18,000 Units by mouth 4 (four) times daily as needed (dairy consumption).     Marland Kitchen leflunomide (ARAVA) 20 MG tablet Take 20  mg by mouth daily.    Marland Kitchen lidocaine (LIDODERM) 5 % Place 1 patch onto the skin daily as needed (pain). Remove & Discard patch within 12 hours or as directed by MD    . mirtazapine (REMERON SOL-TAB) 30 MG disintegrating tablet DISSOLVE 1 TABLET IN MOUTH EVERY NIGHT AT BEDTIME 90 tablet 1  . mometasone (NASONEX) 50 MCG/ACT nasal spray Place 2 sprays into the nose daily. 51 g 0  . mometasone-formoterol (DULERA) 200-5 MCG/ACT AERO Inhale 2 puffs into the lungs 2 (two) times daily. 39 g 1  . montelukast (SINGULAIR) 10 MG tablet TAKE 1 CAPSULE BY MOUTH DAILY 90 tablet 3  . mupirocin ointment (BACTROBAN) 2 % Place 1 application into the nose at bedtime. To prevent nose bleeds 90 g 1  . nystatin (MYCOSTATIN) 100000 UNIT/ML suspension Swish and spit 108m's twice daily 473 mL 3  . nystatin-triamcinolone ointment (MYCOLOG) Apply topically 2 (two) times daily. 90 g 1  . predniSONE (DELTASONE) 5 MG tablet 10 mg.     . Prenatal Vit-Fe Fumarate-FA (PRENATAL MULTIVITAMIN) TABS tablet Take 1 tablet by mouth daily at 12 noon.    .Marland KitchenPropylene Glycol (SYSTANE COMPLETE) 0.6 % SOLN Apply to eye.    .Marland KitchenRespiratory Therapy Supplies (FLUTTER) DEVI Use as directed 1 each 0  . rivaroxaban (XARELTO) 20 MG TABS tablet TAKE 1 TABLET BY MOUTH DAILY WITH SUPPER 90 tablet 3  . sodium chloride HYPERTONIC 3 % nebulizer solution Take by nebulization 2 (two) times daily. 750 mL 12  . Spacer/Aero-Holding Chambers (AEROCHAMBER PLUS) inhaler Use as instructed 1 each 0  . Tiotropium Bromide Monohydrate (SPIRIVA RESPIMAT) 2.5 MCG/ACT AERS Inhale 1 puff into the lungs daily. 4 g 11   No current facility-administered medications on file prior to visit.   Allergies  Allergen Reactions  . Dust Mite Extract Shortness Of Breath and Other (See Comments)    "sneezing" (02/20/2012)  . Molds &  Smuts Shortness Of Breath  . Morphine Sulfate Itching  . Other Shortness Of Breath and Other (See Comments)    Grass and weeds "sneezing; filled sinuses"  (02/20/2012)  . Penicillins Rash and Other (See Comments)    "welts" (02/20/2012) Has patient had a PCN reaction causing immediate rash, facial/tongue/throat swelling, SOB or lightheadedness with hypotension: Unknown Has patient had a PCN reaction causing severe rash involving mucus membranes or skin necrosis: No Has patient had a PCN reaction that required hospitalization: No Has patient had a PCN reaction occurring within the last 10 years: No If all of the above answers are "NO", then may proceed with Cephalosporin use.   Marland Kitchen Reclast [Zoledronic Acid] Other (See Comments)    Fever, Put in hospital, dr said it was a reaction from a reaction   . Rofecoxib Swelling    Vioxx REACTION: feet swelling  . Shrimp Flavor Anaphylaxis    ALL SHELLFISH  . Tetracycline Hcl Nausea And Vomiting  . Xolair [Omalizumab] Other (See Comments)    Caused Blood clot  . Dilaudid [Hydromorphone Hcl] Itching  . Hydrocodone-Acetaminophen Nausea And Vomiting  . Levofloxacin Other (See Comments)    REACTION: GI upset  . Oxycodone Hcl Nausea And Vomiting  . Paroxetine Nausea And Vomiting    Paxil   . Celecoxib Swelling    Feet swelling  . Diltiazem Swelling  . Lactose Intolerance (Gi) Other (See Comments)    Bloating and gas  . Tree Extract Other (See Comments)    "tested and told I was allergic to it; never experienced a reaction to it" (02/20/2012)   Social History   Socioeconomic History  . Marital status: Divorced    Spouse name: Not on file  . Number of children: 2  . Years of education: college  . Highest education level: Not on file  Occupational History  . Occupation: Disabled    Comment: Retired Engineer, production: RETIRED  Tobacco Use  . Smoking status: Passive Smoke Exposure - Never Smoker  . Smokeless tobacco: Never Used  . Tobacco comment: Parents  Substance and Sexual Activity  . Alcohol use: Not Currently    Alcohol/week: 0.0 standard drinks  . Drug use: No   . Sexual activity: Never  Other Topics Concern  . Not on file  Social History Narrative   Patient lives at home alone. Patient  divorced.    Patient has her BS degree.   Right handed.   Caffeine- sometimes coffee.      [facility]:   Born in [place], [place]. She worked as a Copywriter, advertising. She has no pets currently. She does have indoor plants. Previously had mold in her home that was remediated. Carpet was removed.          Social Determinants of Health   Financial Resource Strain:   . Difficulty of Paying Living Expenses:   Food Insecurity:   . Worried About Charity fundraiser in the Last Year:   . Arboriculturist in the Last Year:   Transportation Needs:   . Film/video editor (Medical):   Marland Kitchen Lack of Transportation (Non-Medical):   Physical Activity:   . Days of Exercise per Week:   . Minutes of Exercise per Session:   Stress:   . Feeling of Stress :   Social Connections:   . Frequency of Communication with Friends and Family:   . Frequency of Social Gatherings with Friends and Family:   .  Attends Religious Services:   . Active Member of Clubs or Organizations:   . Attends Archivist Meetings:   Marland Kitchen Marital Status:   Intimate Partner Violence:   . Fear of Current or Ex-Partner:   . Emotionally Abused:   Marland Kitchen Physically Abused:   . Sexually Abused:      Review of Systems  All other systems reviewed and are negative.      Objective:   Physical Exam Vitals reviewed.  Constitutional:      General: She is not in acute distress.    Appearance: Normal appearance. She is well-developed. She is not ill-appearing or toxic-appearing.  HENT:     Right Ear: Tympanic membrane and ear canal normal.     Left Ear: Tympanic membrane and ear canal normal.     Nose: No congestion or rhinorrhea.  Cardiovascular:     Rate and Rhythm: Normal rate and regular rhythm.     Heart sounds: Normal heart sounds.  Pulmonary:     Effort: Pulmonary effort is normal.  No respiratory distress.     Breath sounds: No stridor or decreased air movement. No wheezing or rales.  Abdominal:     General: Bowel sounds are normal. There is no distension.     Palpations: Abdomen is soft.     Tenderness: There is no abdominal tenderness. There is no guarding.  Musculoskeletal:     Right lower leg: Edema present.     Left lower leg: Edema present.  Skin:    Findings: No erythema.  Neurological:     Mental Status: She is alert and oriented to person, place, and time.     Cranial Nerves: No cranial nerve deficit.     Sensory: No sensory deficit.     Motor: No weakness.     Coordination: Coordination normal.   Moon facies, buffalo hump       Assessment & Plan:  Renal insufficiency - Plan: Protein, urine, 24 hour, Protein electrophoresis, serum, US Renal, BASIC METABOLIC PANEL WITH GFR  Institute work-up for renal insufficiency including a renal ultrasound, and SPEP to rule out multiple myeloma, 24-hour urine protein to rule out nephrotic syndrome/nephrotic range proteinuria given the anasarca.  Await the results of this work-up to determine next course of therapy.

## 2019-06-27 DIAGNOSIS — J471 Bronchiectasis with (acute) exacerbation: Secondary | ICD-10-CM | POA: Diagnosis not present

## 2019-06-29 DIAGNOSIS — G4733 Obstructive sleep apnea (adult) (pediatric): Secondary | ICD-10-CM | POA: Diagnosis not present

## 2019-06-29 LAB — PROTEIN ELECTROPHORESIS, SERUM
Albumin ELP: 3.3 g/dL — ABNORMAL LOW (ref 3.8–4.8)
Alpha 1: 0.3 g/dL (ref 0.2–0.3)
Alpha 2: 1 g/dL — ABNORMAL HIGH (ref 0.5–0.9)
Beta 2: 0.2 g/dL (ref 0.2–0.5)
Beta Globulin: 0.4 g/dL (ref 0.4–0.6)
Gamma Globulin: 0.3 g/dL — ABNORMAL LOW (ref 0.8–1.7)
Total Protein: 5.6 g/dL — ABNORMAL LOW (ref 6.1–8.1)

## 2019-06-29 LAB — BASIC METABOLIC PANEL WITH GFR
BUN/Creatinine Ratio: 23 (calc) — ABNORMAL HIGH (ref 6–22)
BUN: 34 mg/dL — ABNORMAL HIGH (ref 7–25)
CO2: 28 mmol/L (ref 20–32)
Calcium: 9.4 mg/dL (ref 8.6–10.4)
Chloride: 101 mmol/L (ref 98–110)
Creat: 1.47 mg/dL — ABNORMAL HIGH (ref 0.60–0.93)
GFR, Est African American: 41 mL/min/{1.73_m2} — ABNORMAL LOW (ref >=60)
GFR, Est Non African American: 36 mL/min/{1.73_m2} — ABNORMAL LOW (ref >=60)
Glucose, Bld: 127 mg/dL — ABNORMAL HIGH (ref 65–99)
Potassium: 3.8 mmol/L (ref 3.5–5.3)
Sodium: 141 mmol/L (ref 135–146)

## 2019-07-01 ENCOUNTER — Ambulatory Visit (INDEPENDENT_AMBULATORY_CARE_PROVIDER_SITE_OTHER): Payer: Medicare PPO | Admitting: Critical Care Medicine

## 2019-07-01 ENCOUNTER — Other Ambulatory Visit: Payer: Self-pay

## 2019-07-01 DIAGNOSIS — Z86711 Personal history of pulmonary embolism: Secondary | ICD-10-CM

## 2019-07-01 DIAGNOSIS — J4550 Severe persistent asthma, uncomplicated: Secondary | ICD-10-CM | POA: Diagnosis not present

## 2019-07-01 DIAGNOSIS — M069 Rheumatoid arthritis, unspecified: Secondary | ICD-10-CM

## 2019-07-01 DIAGNOSIS — J398 Other specified diseases of upper respiratory tract: Secondary | ICD-10-CM

## 2019-07-01 DIAGNOSIS — Z7901 Long term (current) use of anticoagulants: Secondary | ICD-10-CM

## 2019-07-01 DIAGNOSIS — J479 Bronchiectasis, uncomplicated: Secondary | ICD-10-CM

## 2019-07-01 NOTE — Progress Notes (Signed)
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 Functions Testing Results: PFT Results Latest Ref Rng & Units 09/18/2018 06/19/2017 08/08/2015 04/29/2015  FVC-Pre L 1.74 2.00 2.08 2.24  FVC-Predicted Pre % 66 74 75 81  FVC-Post L 1.61 1.86 2.17 2.25  FVC-Predicted Post % 61 69 78 81  Pre FEV1/FVC % % 88 90 83 87  Post FEV1/FCV % % 91 93 87 84  FEV1-Pre L 1.53 1.80 1.73 1.95  FEV1-Predicted Pre % 76 88 82 92  FEV1-Post L 1.47 1.72 1.89 1.89  DLCO UNC% % 77 66 60 63  DLCO COR %Predicted % 92 85 94 81  TLC L 3.52 3.95 - 3.91  TLC % Predicted % 76 85 - 84  RV % Predicted % 79 86 - 83   2020-no significant obstruction, mild restriction, mild DLCO reduction.  Flow volume loops consistent with restriction.  Previous respiratory cultures- all fungal and AFB negative from right middle lobe and lingula during bronchoscopy 08/2016, only positive for normal flora   Pathology 08/23/2016: BAL lingula-no AFB or fungus  Echocardiogram: Unable to review report from 02/2018.  08/05/2017 demonstrates LVEF 65 to 37%, grade 1 diastolic dysfunction.  Normal LA, RV, RA.  Trivial TR and MR, otherwise normal valves.     Assessment & Plan:      ICD-10-CM   1. Severe persistent asthma without complication  C58.85 Pulmonary function test  2. Bronchiectasis without complication ([place])  O27.7 Pulmonary function test  3. Tracheobronchomalacia  J39.8 Pulmonary function test  4. History of pulmonary embolus (PE)  Z86.711   5. Chronic anticoagulation  Z79.01   6. Rheumatoid arthritis, involving unspecified site, unspecified whether rheumatoid factor present ([place])  M06.9 Pulmonary function test     Dyspnea on exertion-likely multifactorial, but my concern is this is primarily due to severity of her tracheobronchomalacia.  Based on most recent PFTs her asthma is fairly well controlled.  No evidence of RA- ILD on PFTs or CT scan. TNF-alpha inhibitors can cause ILD in some people, but she has never had previous evidence of this. All culture results from bronchoscopy reviewed-- negative for chronic infection. No anemia in March 2021.  -Continue triple inhaled therapy -Continue hypertonic saline twice daily with flutter valve. -Repeat PFTs in July 2021.  If these show significant deterioration with restriction, will repeat HRCT chest to evaluate for TNF alpha associated pneumonitis.  This seems less likely given she has had no significant change in her symptoms since starting this medication. -I recommended again today that she try using CPAP during the day if she  is having more severe episodes of shortness of breath to help maintain her airways staying open when she is breathing harder, which will exacerbate her tracheobronchomalacia -We discussed that she has multiple lung conditions that contribute to shortness of breath that do not have good fixes.  I am worried that her shortness of breath is going to be a persistent difficult to control symptom.  Bronchiectasis & tracheobronchomalacia.  I am concerned that she may have a new worsening baseline after her recent exacerbation. -Continue twice daily nebulized hypertonic saline with flutter valve   -Continue CPAP with supplemental oxygen at night.  Recommend using it during the day if she is having more severe shortness of breath. She will continue to follow up with Dr. Ander Slade in sleep medicine clinic  Severe persistent asthma; although PFTs have failed to demonstrate obstruction recently, her CT scan demonstrates air trapping and significant airway collapse consistent with tracheobronchomalacia. Based on her PFTs not demonstrating obstruction, she is less likely to have obliterative bronchiolitis due to RA, which typically is seen in sero-positive RA. 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  No indication of uncontrolled bleeding. -Continue Xarelto indefinitely  Allergic rhinitis -Continue montelukast, Zyrtec  Restrictive lung disease, suspect this may be at least due in part to her recent weight gain -Recommend continued physical activity as tolerated  GERD  -Continue PPI and Pepcid  Chronically immunosuppressed; no evidence of opportunistic infection on recent BAL 12/24/2018    RTC in 2 months after PFTs.    Current Outpatient Medications:  .  acetaminophen (TYLENOL) 650 MG CR tablet, Take 1,300 mg by mouth every 8 (eight) hours as needed for pain., Disp: , Rfl:  .  Alpha-D-Galactosidase (BEANO PO), Take 1 capsule by mouth 3 (three) times daily as needed (gas/ bloating). , Disp: , Rfl:  .  Alpha-Lipoic Acid 600 MG CAPS, Take 600 mg by mouth daily. , Disp: , Rfl:  .  alum & mag hydroxide-simeth (MAALOX/MYLANTA) 200-200-20 MG/5ML suspension, Take 15 mLs by mouth every 4 (four) hours as needed for indigestion or heartburn., Disp: 355 mL, Rfl: 0 .  atorvastatin (LIPITOR) 10 MG tablet, TAKE 1 TABLET BY MOUTH DAILY, Disp: 90 tablet, Rfl: 3 .  Bacillus Coagulans-Inulin (ALIGN  PREBIOTIC-PROBIOTIC PO), Take by mouth every evening., Disp: , Rfl:  .  CALCIUM-MAGNESIUM PO, Take 1 tablet by mouth 2 (two) times daily. , Disp: , Rfl:  .  cetirizine (ZYRTEC) 10 MG tablet, Take 5 mg by mouth every evening. , Disp: , Rfl:  .  cholecalciferol (VITAMIN D) 25 MCG (1000 UT) tablet, Take 1,000 Units by mouth daily. , Disp: , Rfl:  .  cyproheptadine (PERIACTIN) 4 MG tablet, Take 2 tablets (8 mg total) by mouth every evening., Disp: 180 tablet, Rfl: 2 .  denosumab (PROLIA) 60 MG/ML SOLN injection, Inject 60 mg into the skin every 6 (six) months. Administer in upper arm, thigh, or abdomen, Disp: , Rfl:  .  dexlansoprazole (DEXILANT) 60 MG capsule, TAKE 1 CAPSULE BY MOUTH EACH MORNING, Disp: 90 capsule, Rfl: 3 .  diclofenac Sodium (VOLTAREN) 1 % GEL, APPLY 2 GRAMS TOPICALLY TWICE DAILY, Disp: 350 g, Rfl: 1 .  famotidine (PEPCID) 20 MG tablet, Take 1 tablet (20 mg total) by mouth 2 (two) times daily., Disp: 180 tablet, Rfl: 2 .  fluticasone (FLONASE) 50 MCG/ACT  nasal spray, Place 2 sprays into both nostrils daily., Disp: 16 g, Rfl: 2 .  furosemide (LASIX) 40 MG tablet, Take 1 tablet (40 mg total) by mouth daily as needed., Disp: 30 tablet, Rfl: 3 .  gabapentin (NEURONTIN) 600 MG tablet, Take 1 tablet (600 mg total) by mouth daily., Disp: 90 tablet, Rfl: 3 .  guaiFENesin (MUCINEX) 600 MG 12 hr tablet, Take by mouth 2 (two) times daily., Disp: , Rfl:  .  Lactase (LACTAID FAST ACT) 9000 units TABS, Take 18,000 Units by mouth 4 (four) times daily as needed (dairy consumption). , Disp: , Rfl:  .  leflunomide (ARAVA) 20 MG tablet, Take 20 mg by mouth daily., Disp: , Rfl:  .  lidocaine (LIDODERM) 5 %, Place 1 patch onto the skin daily as needed (pain). Remove & Discard patch within 12 hours or as directed by MD, Disp: , Rfl:  .  mirtazapine (REMERON SOL-TAB) 30 MG disintegrating tablet, DISSOLVE 1 TABLET IN MOUTH EVERY NIGHT AT BEDTIME, Disp: 90 tablet, Rfl: 1 .  mometasone (NASONEX) 50 MCG/ACT  nasal spray, Place 2 sprays into the nose daily., Disp: 51 g, Rfl: 0 .  mometasone-formoterol (DULERA) 200-5 MCG/ACT AERO, Inhale 2 puffs into the lungs 2 (two) times daily., Disp: 39 g, Rfl: 1 .  montelukast (SINGULAIR) 10 MG tablet, TAKE 1 CAPSULE BY MOUTH DAILY, Disp: 90 tablet, Rfl: 3 .  mupirocin ointment (BACTROBAN) 2 %, Place 1 application into the nose at bedtime. To prevent nose bleeds, Disp: 90 g, Rfl: 1 .  nystatin (MYCOSTATIN) 100000 UNIT/ML suspension, Swish and spit 64m's twice daily, Disp: 473 mL, Rfl: 3 .  nystatin-triamcinolone ointment (MYCOLOG), Apply topically 2 (two) times daily., Disp: 90 g, Rfl: 1 .  predniSONE (DELTASONE) 5 MG tablet, 10 mg. , Disp: , Rfl:  .  Prenatal Vit-Fe Fumarate-FA (PRENATAL MULTIVITAMIN) TABS tablet, Take 1 tablet by mouth daily at 12 noon., Disp: , Rfl:  .  Propylene Glycol (SYSTANE COMPLETE) 0.6 % SOLN, Apply to eye., Disp: , Rfl:  .  Respiratory Therapy Supplies (FLUTTER) DEVI, Use as directed, Disp: 1 each, Rfl: 0 .  rivaroxaban (XARELTO) 20 MG TABS tablet, TAKE 1 TABLET BY MOUTH DAILY WITH SUPPER, Disp: 90 tablet, Rfl: 3 .  sodium chloride HYPERTONIC 3 % nebulizer solution, Take by nebulization 2 (two) times daily., Disp: 750 mL, Rfl: 12 .  Spacer/Aero-Holding Chambers (AEROCHAMBER PLUS) inhaler, Use as instructed, Disp: 1 each, Rfl: 0 .  Tiotropium Bromide Monohydrate (SPIRIVA RESPIMAT) 2.5 MCG/ACT AERS, Inhale 1 puff into the lungs daily., Disp: 4 g, Rfl: 11 .  albuterol (PROAIR HFA) 108 (90 Base) MCG/ACT inhaler, Inhale 2 puffs into the lungs every 4 (four) hours as needed for wheezing or shortness of breath. (Patient not taking: Reported on 07/01/2019), Disp: 18 g, Rfl: 5 .  azelastine (ASTELIN) 0.1 % nasal spray, USE 2 SPRAYS IN EACH NOSTRIL DAILY AS DIRECTED (Patient not taking: Reported on 07/01/2019), Disp: 30 mL, Rfl: 5 .  EPINEPHrine (EPIPEN 2-PAK) 0.3 mg/0.3 mL IJ SOAJ injection, Inject 0.3 mLs (0.3 mg total) into the muscle Once PRN.  (Patient not taking: Reported on 07/01/2019), Disp: 0.3 mL, Rfl: 2 .  ferrous sulfate 325 (65 FE) MG EC tablet, Take 325 mg by mouth daily with breakfast., Disp: , Rfl:    LJulian Hy DO [facility] 07/01/2019 11:35 AM

## 2019-07-01 NOTE — Patient Instructions (Signed)
Thank you for visiting Dr. Carlis Abbott at [facility]. We recommend the following: Orders Placed This Encounter  Procedures  . Pulmonary function test   Orders Placed This Encounter  Procedures  . Pulmonary function test    Standing Status:   Future    Standing Expiration Date:   06/30/2020    Scheduling Instructions:     July 2021    Order Specific Question:   Where should this test be performed?    Answer:   [facility]    Order Specific Question:   Full PFT: includes the following: basic spirometry, spirometry pre & post bronchodilator, diffusion capacity (DLCO), lung volumes    Answer:   Full PFT    No orders of the defined types were placed in this encounter.   No follow-ups on file.    Please do your part to reduce the spread of COVID-19.

## 2019-07-02 ENCOUNTER — Other Ambulatory Visit: Payer: Self-pay

## 2019-07-02 ENCOUNTER — Other Ambulatory Visit: Payer: Medicare PPO

## 2019-07-02 DIAGNOSIS — M7989 Other specified soft tissue disorders: Secondary | ICD-10-CM | POA: Diagnosis not present

## 2019-07-02 DIAGNOSIS — Z7952 Long term (current) use of systemic steroids: Secondary | ICD-10-CM | POA: Diagnosis not present

## 2019-07-02 DIAGNOSIS — N289 Disorder of kidney and ureter, unspecified: Secondary | ICD-10-CM | POA: Diagnosis not present

## 2019-07-02 DIAGNOSIS — M0609 Rheumatoid arthritis without rheumatoid factor, multiple sites: Secondary | ICD-10-CM | POA: Diagnosis not present

## 2019-07-02 DIAGNOSIS — M810 Age-related osteoporosis without current pathological fracture: Secondary | ICD-10-CM | POA: Diagnosis not present

## 2019-07-02 DIAGNOSIS — Z79899 Other long term (current) drug therapy: Secondary | ICD-10-CM | POA: Diagnosis not present

## 2019-07-02 DIAGNOSIS — M79643 Pain in unspecified hand: Secondary | ICD-10-CM | POA: Diagnosis not present

## 2019-07-03 ENCOUNTER — Encounter: Payer: Self-pay | Admitting: Family Medicine

## 2019-07-03 ENCOUNTER — Ambulatory Visit
Admission: RE | Admit: 2019-07-03 | Discharge: 2019-07-03 | Disposition: A | Discharge status: Home / Self Care | Payer: Medicare PPO | Source: Ambulatory Visit | Attending: Family Medicine | Admitting: Family Medicine

## 2019-07-03 DIAGNOSIS — N2889 Other specified disorders of kidney and ureter: Secondary | ICD-10-CM | POA: Diagnosis not present

## 2019-07-03 DIAGNOSIS — N289 Disorder of kidney and ureter, unspecified: Secondary | ICD-10-CM

## 2019-07-07 ENCOUNTER — Other Ambulatory Visit: Payer: Self-pay | Admitting: Family Medicine

## 2019-07-07 ENCOUNTER — Encounter: Payer: Self-pay | Admitting: Family Medicine

## 2019-07-07 DIAGNOSIS — N289 Disorder of kidney and ureter, unspecified: Secondary | ICD-10-CM

## 2019-07-07 LAB — TIQ-MISC

## 2019-07-07 LAB — PROTEIN, URINE, 24 HOUR: Protein, 24H Urine: 176 mg/24 h — ABNORMAL HIGH (ref 0–149)

## 2019-07-07 MED ORDER — NYSTATIN-TRIAMCINOLONE 100000-0.1 UNIT/GM-% EX OINT: TOPICAL_OINTMENT | Freq: Two times a day (BID) | CUTANEOUS | 1 refills | Status: DC

## 2019-07-07 MED ORDER — FUROSEMIDE 40 MG PO TABS: 40.0000 mg | ORAL_TABLET | Freq: Two times a day (BID) | ORAL | 3 refills | Status: DC

## 2019-07-08 ENCOUNTER — Encounter: Payer: Self-pay | Admitting: Cardiology

## 2019-07-08 ENCOUNTER — Other Ambulatory Visit: Payer: Self-pay

## 2019-07-08 ENCOUNTER — Ambulatory Visit: Payer: Medicare PPO | Admitting: Cardiology

## 2019-07-08 VITALS — BP 129/74 | HR 86 | Ht 61.0 in | Wt 151.8 lb

## 2019-07-08 DIAGNOSIS — R0602 Shortness of breath: Secondary | ICD-10-CM | POA: Diagnosis not present

## 2019-07-08 DIAGNOSIS — I5032 Chronic diastolic (congestive) heart failure: Secondary | ICD-10-CM | POA: Diagnosis not present

## 2019-07-08 DIAGNOSIS — Z7189 Other specified counseling: Secondary | ICD-10-CM | POA: Diagnosis not present

## 2019-07-08 DIAGNOSIS — R600 Localized edema: Secondary | ICD-10-CM | POA: Diagnosis not present

## 2019-07-08 NOTE — Progress Notes (Signed)
Cardiology Office Note:    Date:  07/08/2019   ID:  Ebony Scott, DOB [birth year], MRN [id number]  PCP:  [name], MD  Cardiologist:  Buford Dresser, MD PhD  Referring MD: [name], MD   CC: follow up  History of Present Illness:    Ebony Scott is a 69 y.o. female with a hx of asthma and other medical conditions listed below who is followed for incidental coronary artery calcification seen on CT scan. Initial consult was 10/15/17.  Cardiac history: had CT chest 07/2017 which noted diffuse calcification, including aorta, aortic branch vessels, and coronary arteries. Had a normal Lexiscan in the past. No chest pain, main longstanding symptom is shortness of breath, followed by pulmonary. No history of clinical ASCVD events.   Today: Has had more feet swelling over the last 3-4 weeks. Bilateral. Rheumatologist doesn't think it's her RA. 24 hour urine had some protein. U/S kidney was normal. Hands are unchanged chronic swelling from the RA.  Doesn't weigh herself at home, but feels like she has gained 40 lbs on steroids. Feels short of breath, even at rest. Uses CPAP at night, and she has a nebulizer and O2 on CPAP now as well. Doesn't feel that it has made a difference. Since her lung infection last year, she has felt like things have slowly gotten worse. Uses flutter valve, helps to dislodge sputum and bring it up.  On lasix 40 mg daily currently, but changing to 40 mg BID tomorrow per her PCP. She makes good urine on this but doesn't feel better. Also on prednisone, which has been decreased but can't be weaned off.  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 Occasional hemhorroidal bleeding but not severe. Does bruise easily.  Past Medical History:  Diagnosis Date  . Acute deep vein thrombosis (DVT) of right lower extremity ([place]) 12/13/2017  . Allergic rhinitis   . Allergy    SEASONAL  . Anemia   . Anxiety    pt denies  . Asthma   . Breast cancer ([place]) 1998   in remission  . Cataract    REMOVED  . Clostridium difficile colitis 01/14/2018  . Clostridium difficile diarrhea 01/14/2018  . Complication of anesthesia    "had hard time waking up from it several times" (02/20/2012)  . Depression    "some; don't take anything for it" (02/20/2012)  . Diverticulosis   . DVT (deep venous thrombosis) ([address])   . Exertional dyspnea   . Fibromyalgia 11/2011  . GERD (gastroesophageal reflux disease)   . Graves disease   . Headache(784.0)    "related to allergies; more at different times during the year" (02/20/2012)  . Hemorrhoids   . Hiatal hernia    back and neck  . Hx of adenomatous colonic polyps 04/12/2016  . Hypercholesteremia    good cholesterol is high  . Hypothyroidism   . IBS (irritable bowel syndrome)   . Moderate persistent asthma    -FeV1 72% 2011, -IgE 102 2011, CT sinus Neg 2011  . Osteoporosis    on reclast yearly  . Pneumonia 04/2011; ~ 11/2011   "double; single" (02/20/2012)  . Seronegative rheumatoid arthritis ([place])    Dr. Lahoma Rocker  . SIRS (systemic inflammatory response syndrome) ([place]) 02/10/2018  . Tracheobronchomalacia     Past Surgical History:  Procedure Laterality Date  . ANTERIOR AND POSTERIOR REPAIR  1990's  . APPENDECTOMY    . BREAST LUMPECTOMY  1998   left  . CARPOMETACARPEL ([place])  FUSION OF THUMB WITH AUTOGRAFT FROM RADIUS  ~ 2009   "both thumbs" (02/20/2012)  . CATARACT EXTRACTION W/ INTRAOCULAR LENS  IMPLANT, BILATERAL  2012  . CERVICAL DISCECTOMY  10/2001   C5-C6  . CERVICAL FUSION  2003   C3-C4  . CHOLECYSTECTOMY    . COLONOSCOPY    . DEBRIDEMENT TENNIS ELBOW  ?1970's   right  . ESOPHAGOGASTRODUODENOSCOPY    . HYSTERECTOMY    . KNEE ARTHROPLASTY  ?1990's   "?right; w/cartilage repair" (02/20/2012)  . NASAL SEPTUM SURGERY  1980's  . POSTERIOR CERVICAL FUSION/FORAMINOTOMY  2004   "failed initial fusion; rewired  anterior neck" (02/20/2012)  . TONSILLECTOMY  ~ 1953  . VESICOVAGINAL FISTULA CLOSURE W/ TAH   1988  . VIDEO BRONCHOSCOPY Bilateral 08/23/2016   Procedure: VIDEO BRONCHOSCOPY WITH FLUORO;  Surgeon: Javier Glazier, MD;  Location: [facility];  Service: Cardiopulmonary;  Laterality: Bilateral;    Current Medications: Current Outpatient Medications on File Prior to Visit  Medication Sig  . acetaminophen (TYLENOL) 650 MG CR tablet Take 1,300 mg by mouth every 8 (eight) hours as needed for pain.  Marland Kitchen albuterol (PROAIR HFA) 108 (90 Base) MCG/ACT inhaler Inhale 2 puffs into the lungs every 4 (four) hours as needed for wheezing or shortness of breath.  . Alpha-D-Galactosidase (BEANO PO) Take 1 capsule by mouth 3 (three) times daily as needed (gas/ bloating).   . Alpha-Lipoic Acid 600 MG CAPS Take 600 mg by mouth daily.   Marland Kitchen alum & mag hydroxide-simeth (MAALOX/MYLANTA) 200-200-20 MG/5ML suspension Take 15 mLs by mouth every 4 (four) hours as needed for indigestion or heartburn.  Marland Kitchen atorvastatin (LIPITOR) 10 MG tablet TAKE 1 TABLET BY MOUTH DAILY  . azelastine (ASTELIN) 0.1 % nasal spray USE 2 SPRAYS IN EACH NOSTRIL DAILY AS DIRECTED  . Bacillus Coagulans-Inulin (ALIGN PREBIOTIC-PROBIOTIC PO) Take by mouth every evening.  Marland Kitchen CALCIUM-MAGNESIUM PO Take 1 tablet by mouth 2 (two) times daily.   . cetirizine (ZYRTEC) 10 MG tablet Take 5 mg by mouth every evening.   . cholecalciferol (VITAMIN D) 25 MCG (1000 UT) tablet Take 1,000 Units by mouth daily.   . cyproheptadine (PERIACTIN) 4 MG tablet Take 2 tablets (8 mg total) by mouth every evening.  . denosumab (PROLIA) 60 MG/ML SOLN injection Inject 60 mg into the skin every 6 (six) months. Administer in upper arm, thigh, or abdomen  . dexlansoprazole (DEXILANT) 60 MG capsule TAKE 1 CAPSULE BY MOUTH EACH MORNING  . diclofenac Sodium (VOLTAREN) 1 % GEL APPLY 2 GRAMS TOPICALLY TWICE DAILY  . EPINEPHrine (EPIPEN 2-PAK) 0.3 mg/0.3 mL IJ SOAJ injection Inject 0.3 mLs (0.3 mg total) into the muscle Once PRN.  . famotidine (PEPCID) 20 MG tablet Take 1 tablet (20  mg total) by mouth 2 (two) times daily.  . fluticasone (FLONASE) 50 MCG/ACT nasal spray Place 2 sprays into both nostrils daily.  . furosemide (LASIX) 40 MG tablet Take 1 tablet (40 mg total) by mouth 2 (two) times daily.  Marland Kitchen gabapentin (NEURONTIN) 600 MG tablet Take 1 tablet (600 mg total) by mouth daily.  Marland Kitchen guaiFENesin (MUCINEX) 600 MG 12 hr tablet Take by mouth 2 (two) times daily.  . Lactase (LACTAID FAST ACT) 9000 units TABS Take 18,000 Units by mouth 4 (four) times daily as needed (dairy consumption).   Marland Kitchen leflunomide (ARAVA) 20 MG tablet Take 20 mg by mouth daily.  Marland Kitchen lidocaine (LIDODERM) 5 % Place 1 patch onto the skin daily as needed (pain). Remove & Discard  patch within 12 hours or as directed by MD  . mirtazapine (REMERON SOL-TAB) 30 MG disintegrating tablet DISSOLVE 1 TABLET IN MOUTH EVERY NIGHT AT BEDTIME  . mometasone (NASONEX) 50 MCG/ACT nasal spray Place 2 sprays into the nose daily.  . mometasone-formoterol (DULERA) 200-5 MCG/ACT AERO Inhale 2 puffs into the lungs 2 (two) times daily.  . montelukast (SINGULAIR) 10 MG tablet TAKE 1 CAPSULE BY MOUTH DAILY  . mupirocin ointment (BACTROBAN) 2 % Place 1 application into the nose at bedtime. To prevent nose bleeds  . nystatin (MYCOSTATIN) 100000 UNIT/ML suspension Swish and spit 66mL's twice daily  . nystatin-triamcinolone ointment (MYCOLOG) Apply topically 2 (two) times daily.  . predniSONE (DELTASONE) 5 MG tablet 10 mg.   . Prenatal Vit-Fe Fumarate-FA (PRENATAL MULTIVITAMIN) TABS tablet Take 1 tablet by mouth daily at 12 noon.  Marland Kitchen Propylene Glycol (SYSTANE COMPLETE) 0.6 % SOLN Apply to eye.  Marland Kitchen Respiratory Therapy Supplies (FLUTTER) DEVI Use as directed  . rivaroxaban (XARELTO) 20 MG TABS tablet TAKE 1 TABLET BY MOUTH DAILY WITH SUPPER  . sodium chloride HYPERTONIC 3 % nebulizer solution Take by nebulization 2 (two) times daily.  Marland Kitchen Spacer/Aero-Holding Chambers (AEROCHAMBER PLUS) inhaler Use as instructed  . Tiotropium Bromide Monohydrate  (SPIRIVA RESPIMAT) 2.5 MCG/ACT AERS Inhale 1 puff into the lungs daily.  . ferrous sulfate 325 (65 FE) MG EC tablet Take 325 mg by mouth daily with breakfast.   No current facility-administered medications on file prior to visit.     Allergies:   Dust mite extract, Molds & smuts, Morphine sulfate, Other, Penicillins, Reclast [zoledronic acid], Rofecoxib, Shrimp flavor, Tetracycline hcl, Xolair [omalizumab], Dilaudid [hydromorphone hcl], Hydrocodone-acetaminophen, Levofloxacin, Oxycodone hcl, Paroxetine, Celecoxib, Diltiazem, Lactose intolerance (gi), and Tree extract   Social History   Tobacco Use  . Smoking status: Passive Smoke Exposure - Never Smoker  . Smokeless tobacco: Never Used  . Tobacco comment: Parents  Substance Use Topics  . Alcohol use: Not Currently    Alcohol/week: 0.0 standard drinks  . Drug use: No    Family History: The patient's family history includes Allergies in her father and mother; Arthritis in her father and mother; Colitis in her daughter; Colon cancer in an other family member; Diabetes in her maternal grandfather and mother; Heart disease in her father and mother; Lung cancer in her mother; Stroke in her father. Father had 4V CABG and valve replacement at age 42; had a complication with ruptured aorta after surgery and passed away. Mother had lung cancer and diabetes. No other significant heart disease or risks.   ROS:   Please see the history of present illness.  Additional pertinent ROS otherwise unremarkable.  EKGs/Labs/Other Studies Reviewed:    The following studies were reviewed today: Echo 08/05/17 Study Conclusions  - Left ventricle: The cavity size was normal. There was mild focal   basal hypertrophy of the septum. Systolic function was vigorous.   The estimated ejection fraction was in the range of 65% to 70%.   Wall motion was normal; there were no regional wall motion   abnormalities. Doppler parameters are consistent with abnormal   left  ventricular relaxation (grade 1 diastolic dysfunction).  Impressions:  - Definity used; vigorous LV systolic function; mild diastolic   dysfunction.  CT chest high resolution 6.17.19 FINDINGS: Cardiovascular: Heart size is normal. There is no significant pericardial fluid, thickening or pericardial calcification. There is aortic atherosclerosis, as well as atherosclerosis of the great vessels of the mediastinum and the coronary arteries, including calcified  atherosclerotic plaque in the left main, left anterior descending left circumflex coronary arteries.  IMPRESSION: 1. No findings to suggest interstitial lung disease. 2. Mild air trapping indicative of mild small airways disease. 3. Aortic atherosclerosis, in addition to left main and 2 vessel coronary artery disease. Please note that although the presence of coronary artery calcium documents the presence of coronary artery disease, the severity of this disease and any potential stenosis cannot be assessed on this non-gated CT examination. Assessment for potential risk factor modification, dietary therapy or pharmacologic therapy may be warranted, if clinically indicated. 4. Additional incidental findings, as above.  Stress 2011:  Stress echo 11/2009 Study Conclusions - Stress ECG conclusions: There were no stress arrhythmias or conduction abnormalities. The stress ECG was normal. - Staged echo: There was no echocardiographic evidence for stress-induced ischemia. Impressions: - Nondiagnostic stress echocardiogram with nochest pain, no ST changes; patient did not achieve target heart rate; no stress-induced wall motion abnormalities.  Had [place] 12/2009 due to nondiagnostic stress echo: Stress Procedure  The patient received IV Lexiscan 0.4 mg over 15-seconds with concurrent low level exercise and then Technetium 24m Tetrofosmin was injected at 30-seconds while the patient continued walking one more minute.  There were  no significant changes with Lexiscan.  Quantitative spect images were obtained after a 45 minute delay.  QPS Raw Data Images:  Normal; no motion artifact; normal heart/lung ratio. Stress Images:  Normal homogeneous uptake in all areas of the myocardium. Rest Images:  Normal homogeneous uptake in all areas of the myocardium. Subtraction (SDS):  No evidence of ischemia. Transient Ischemic Dilatation:  1.15  (Normal <1.22)  Lung/Heart Ratio:  .27  (Normal <0.45)  Quantitative Gated Spect Images QGS EDV:  52 ml QGS ESV:  13 ml QGS EF:  75 % QGS cine images:  Normal motion  Findings Normal nuclear study  EKG:  EKG is personally reviewed today.  The ekg ordered 02/26/19 demonstrates normal sinus rhythm, lack of anterior forces V1/V2  Recent Labs: 07/29/2018: Pro B Natriuretic peptide (BNP) 53.0 05/07/2019: Brain Natriuretic Peptide 41; Hemoglobin 13.0; Platelets 198 06/18/2019: ALT 53 06/26/2019: BUN 34; Creat 1.47; Potassium 3.8; Sodium 141  Recent Lipid Panel    Component Value Date/Time   CHOL 192 07/04/2018 1017   TRIG 212 (H) 07/04/2018 1017   HDL 84 07/04/2018 1017   CHOLHDL 2.3 07/04/2018 1017   VLDL 27 08/20/2016 1103   LDLCALC 77 07/04/2018 1017    Physical Exam:    VS:  BP 129/74   Pulse 86   Ht 5\' 1"  (1.549 m)   Wt 151 lb 12.8 oz (68.9 kg)   BMI 28.68 kg/m     Wt Readings from Last 3 Encounters:  07/08/19 151 lb 12.8 oz (68.9 kg)  06/26/19 150 lb (68 kg)  05/15/19 146 lb (66.2 kg)    GEN: Well nourished, well developed in no acute distress HEENT: Normal, moist mucous membranes NECK: No JVD CARDIAC: regular rhythm, normal S1 and S2, no rubs or gallops. No murmur. VASCULAR: Radial and DP pulses 2+ bilaterally. No carotid bruits RESPIRATORY:  Clear to auscultation without rales, wheezing or rhonchi  ABDOMEN: Soft, non-tender, non-distended MUSCULOSKELETAL:  Ambulates independently SKIN: Warm and dry, 1+ bilateral calf edema plus palpable bilateral ankle bursa  without warmth or erythema NEUROLOGIC:  Alert and oriented x 3. No focal neuro deficits noted. PSYCHIAT RIC:  Normal affect   ASSESSMENT:    1. Bilateral leg edema   2. Chronic diastolic CHF (congestive heart  failure) ([place])   3. SOB (shortness of breath)   4. Cardiac risk counseling   5. Counseling on health promotion and disease prevention   6. Encounter for education about heart failure    PLAN:    Bilateral LE edema, shortness of breath, chronic diastolic heart failure -weight here only up ~5 lbs from 04/2019 -has both mild LE edema and palpable ankle bursa -with worsening symptoms, will re-evaluate for cardiac cause with echocardiogram -recent BNP normal -counseled on heart failure education today  Coronary calcium seen on CT scan: suggests CAD equivalent -asymptomatic -prior negative nuclear stress, echo without WMA -counseled on prevention, below  Hypercholesterolemia with goal LDL <70, hypertriglyceridemia (unknown if fasting) -lipids from 07/04/2018 [facility] reviewed -continue atorvastatin -LFTs normal 12/2018 per [facility]  Secondary prevention: -recommend heart healthy/Mediterranean diet, with whole grains, fruits, vegetable, fish, lean meats, nuts, and olive oil. Limit salt. -recommend moderate walking, 3-5 times/week for 30-50 minutes each session. Aim for at least 150 minutes.week. Goal should be pace of 3 miles/hours, or walking 1.5 miles in 30 minutes -recommend avoidance of tobacco products. Avoid excess alcohol. -Additional risk factor control:  -Diabetes: no history  -Lipids: as above  -Blood pressure control: at goal  -Weight: BMI 28 -no aspirin given plans for long term anticoagulation  Plan for follow up: 6 mos or sooner based on symptoms, if echo unremarkable  Buford Dresser, MD, PhD [place]  [facility]   Medication Adjustments/Labs and Tests Ordered: Current medicines are reviewed at length with the patient today.  Concerns regarding  medicines are outlined above.  Orders Placed This Encounter  Procedures  . ECHOCARDIOGRAM COMPLETE   No orders of the defined types were placed in this encounter.   Patient Instructions  Medication Instructions:  Your Physician recommend you continue on your current medication as directed.    *If you need a refill on your cardiac medications before your next appointment, please call your pharmacy*   Lab Work: None   Testing/Procedures: Your physician has requested that you have an echocardiogram. Echocardiography is a painless test that uses sound waves to create images of your heart. It provides your doctor with information about the size and shape of your heart and how well your heart's chambers and valves are working. This procedure takes approximately one hour. There are no restrictions for this procedure. [place] [address]    Follow-Up: At [facility], you and your health needs are our priority.  As part of our continuing mission to provide you with exceptional heart care, we have created designated Provider Care Teams.  These Care Teams include your primary Cardiologist (physician) and Advanced Practice Providers (APPs -  Physician Assistants and Nurse Practitioners) who all work together to provide you with the care you need, when you need it.  We recommend signing up for the patient portal called "MyChart".  Sign up information is provided on this After Visit Summary.  MyChart is used to connect with patients for Virtual Visits (Telemedicine).  Patients are able to view lab/test results, encounter notes, upcoming appointments, etc.  Non-urgent messages can be sent to your provider as well.   To learn more about what you can do with MyChart, go to [email or web address].    Your next appointment:   6 month(s)  The format for your next appointment:   In Person  Provider:   Buford Dresser, MD    Do the following things EVERY DAY:  1) Weigh  yourself EVERY morning after you  go to the bathroom but before you eat or drink anything. Write this number down in a weight log/diary. If you gain 5 pounds in a week, call the office.  2) Take your medicines as prescribed. If you have concerns about your medications, please call us before you stop taking them.   3) Eat low salt foods-Limit salt (sodium) to 2000 mg per day. This will help prevent your body from holding onto fluid. Read food labels as many processed foods have a lot of sodium, especially canned goods and prepackaged meats. If you would like some assistance choosing low sodium foods, we would be happy to set you up with a nutritionist.  4) Stay as active as you can everyday. Staying active will give you more energy and make your muscles stronger. Start with 5 minutes at a time and work your way up to 30 minutes a day. Break up your activities--do some in the morning and some in the afternoon. Start with 3 days per week and work your way up to 5 days as you can.  If you have chest pain, feel short of breath, dizzy, or lightheaded, STOP. If you don't feel better after a short rest, call 911. If you do feel better, call the office to let us know you have symptoms with exercise.  5) Limit all fluids for the day to less than 2 liters. Fluid includes all drinks, coffee, juice, ice chips, soup, jello, and all other liquids.     Signed, Buford Dresser, MD PhD 07/08/2019   [place]

## 2019-07-08 NOTE — Patient Instructions (Addendum)
Medication Instructions:  Your Physician recommend you continue on your current medication as directed.    *If you need a refill on your cardiac medications before your next appointment, please call your pharmacy*   Lab Work: None   Testing/Procedures: Your physician has requested that you have an echocardiogram. Echocardiography is a painless test that uses sound waves to create images of your heart. It provides your doctor with information about the size and shape of your heart and how well your heart's chambers and valves are working. This procedure takes approximately one hour. There are no restrictions for this procedure. [place] [address]    Follow-Up: At [facility], you and your health needs are our priority.  As part of our continuing mission to provide you with exceptional heart care, we have created designated Provider Care Teams.  These Care Teams include your primary Cardiologist (physician) and Advanced Practice Providers (APPs -  Physician Assistants and Nurse Practitioners) who all work together to provide you with the care you need, when you need it.  We recommend signing up for the patient portal called "MyChart".  Sign up information is provided on this After Visit Summary.  MyChart is used to connect with patients for Virtual Visits (Telemedicine).  Patients are able to view lab/test results, encounter notes, upcoming appointments, etc.  Non-urgent messages can be sent to your provider as well.   To learn more about what you can do with MyChart, go to [email or web address].    Your next appointment:   6 month(s)  The format for your next appointment:   In Person  Provider:   Buford Dresser, MD    Do the following things EVERY DAY:  1) Weigh yourself EVERY morning after you go to the bathroom but before you eat or drink anything. Write this number down in a weight log/diary. If you gain 5 pounds in a week, call the office.  2) Take  your medicines as prescribed. If you have concerns about your medications, please call us before you stop taking them.   3) Eat low salt foods--Limit salt (sodium) to 2000 mg per day. This will help prevent your body from holding onto fluid. Read food labels as many processed foods have a lot of sodium, especially canned goods and prepackaged meats. If you would like some assistance choosing low sodium foods, we would be happy to set you up with a nutritionist.  4) Stay as active as you can everyday. Staying active will give you more energy and make your muscles stronger. Start with 5 minutes at a time and work your way up to 30 minutes a day. Break up your activities--do some in the morning and some in the afternoon. Start with 3 days per week and work your way up to 5 days as you can.  If you have chest pain, feel short of breath, dizzy, or lightheaded, STOP. If you don't feel better after a short rest, call 911. If you do feel better, call the office to let us know you have symptoms with exercise.  5) Limit all fluids for the day to less than 2 liters. Fluid includes all drinks, coffee, juice, ice chips, soup, jello, and all other liquids.

## 2019-07-10 ENCOUNTER — Ambulatory Visit (HOSPITAL_COMMUNITY): Payer: Medicare PPO | Attending: Cardiology

## 2019-07-10 ENCOUNTER — Other Ambulatory Visit: Payer: Self-pay

## 2019-07-10 DIAGNOSIS — R0602 Shortness of breath: Secondary | ICD-10-CM | POA: Insufficient documentation

## 2019-07-10 DIAGNOSIS — R600 Localized edema: Secondary | ICD-10-CM | POA: Diagnosis not present

## 2019-07-15 ENCOUNTER — Other Ambulatory Visit: Payer: Self-pay

## 2019-07-15 ENCOUNTER — Encounter: Payer: Self-pay | Admitting: Cardiology

## 2019-07-15 ENCOUNTER — Telehealth: Payer: Self-pay | Admitting: Cardiology

## 2019-07-15 ENCOUNTER — Other Ambulatory Visit: Payer: Medicare PPO

## 2019-07-15 DIAGNOSIS — J45909 Unspecified asthma, uncomplicated: Secondary | ICD-10-CM | POA: Diagnosis not present

## 2019-07-15 DIAGNOSIS — G894 Chronic pain syndrome: Secondary | ICD-10-CM | POA: Diagnosis not present

## 2019-07-15 DIAGNOSIS — G4736 Sleep related hypoventilation in conditions classified elsewhere: Secondary | ICD-10-CM | POA: Diagnosis not present

## 2019-07-15 DIAGNOSIS — N289 Disorder of kidney and ureter, unspecified: Secondary | ICD-10-CM | POA: Diagnosis not present

## 2019-07-15 DIAGNOSIS — I5032 Chronic diastolic (congestive) heart failure: Secondary | ICD-10-CM | POA: Diagnosis not present

## 2019-07-15 DIAGNOSIS — R0602 Shortness of breath: Secondary | ICD-10-CM | POA: Diagnosis not present

## 2019-07-15 DIAGNOSIS — J4540 Moderate persistent asthma, uncomplicated: Secondary | ICD-10-CM | POA: Diagnosis not present

## 2019-07-15 DIAGNOSIS — M0600 Rheumatoid arthritis without rheumatoid factor, unspecified site: Secondary | ICD-10-CM | POA: Diagnosis not present

## 2019-07-15 DIAGNOSIS — G4733 Obstructive sleep apnea (adult) (pediatric): Secondary | ICD-10-CM | POA: Diagnosis not present

## 2019-07-15 DIAGNOSIS — J471 Bronchiectasis with (acute) exacerbation: Secondary | ICD-10-CM | POA: Diagnosis not present

## 2019-07-15 NOTE — Telephone Encounter (Signed)
New Message    Pt is calling for  Echo results     Please call back  

## 2019-07-15 NOTE — Telephone Encounter (Signed)
Called patient- gave her ECHO results.  Patient verbalized understanding.

## 2019-07-16 ENCOUNTER — Encounter: Payer: Self-pay | Admitting: Family Medicine

## 2019-07-16 ENCOUNTER — Other Ambulatory Visit: Payer: Self-pay | Admitting: *Deleted

## 2019-07-16 DIAGNOSIS — E876 Hypokalemia: Secondary | ICD-10-CM

## 2019-07-16 LAB — BASIC METABOLIC PANEL
BUN/Creatinine Ratio: 26 (calc) — ABNORMAL HIGH (ref 6–22)
BUN: 48 mg/dL — ABNORMAL HIGH (ref 7–25)
CO2: 30 mmol/L (ref 20–32)
Calcium: 8.9 mg/dL (ref 8.6–10.4)
Chloride: 98 mmol/L (ref 98–110)
Creat: 1.85 mg/dL — ABNORMAL HIGH (ref 0.60–0.93)
Glucose, Bld: 168 mg/dL — ABNORMAL HIGH (ref 65–99)
Potassium: 3.4 mmol/L — ABNORMAL LOW (ref 3.5–5.3)
Sodium: 139 mmol/L (ref 135–146)

## 2019-07-21 ENCOUNTER — Other Ambulatory Visit: Payer: Medicare PPO

## 2019-07-21 ENCOUNTER — Other Ambulatory Visit: Payer: Self-pay

## 2019-07-21 DIAGNOSIS — E876 Hypokalemia: Secondary | ICD-10-CM | POA: Diagnosis not present

## 2019-07-21 LAB — BASIC METABOLIC PANEL
BUN/Creatinine Ratio: 26 (calc) — ABNORMAL HIGH (ref 6–22)
BUN: 47 mg/dL — ABNORMAL HIGH (ref 7–25)
CO2: 30 mmol/L (ref 20–32)
Calcium: 9.1 mg/dL (ref 8.6–10.4)
Chloride: 98 mmol/L (ref 98–110)
Creat: 1.81 mg/dL — ABNORMAL HIGH (ref 0.60–0.93)
Glucose, Bld: 207 mg/dL — ABNORMAL HIGH (ref 65–99)
Potassium: 3.8 mmol/L (ref 3.5–5.3)
Sodium: 138 mmol/L (ref 135–146)

## 2019-07-23 ENCOUNTER — Encounter: Payer: Self-pay | Admitting: Family Medicine

## 2019-07-28 DIAGNOSIS — J471 Bronchiectasis with (acute) exacerbation: Secondary | ICD-10-CM | POA: Diagnosis not present

## 2019-07-30 ENCOUNTER — Ambulatory Visit (INDEPENDENT_AMBULATORY_CARE_PROVIDER_SITE_OTHER): Payer: Medicare PPO

## 2019-07-30 ENCOUNTER — Ambulatory Visit: Payer: Medicare PPO | Admitting: Podiatry

## 2019-07-30 ENCOUNTER — Other Ambulatory Visit: Payer: Self-pay

## 2019-07-30 DIAGNOSIS — S92301K Fracture of unspecified metatarsal bone(s), right foot, subsequent encounter for fracture with nonunion: Secondary | ICD-10-CM

## 2019-07-30 DIAGNOSIS — L84 Corns and callosities: Secondary | ICD-10-CM

## 2019-07-30 DIAGNOSIS — B351 Tinea unguium: Secondary | ICD-10-CM

## 2019-07-30 DIAGNOSIS — G4733 Obstructive sleep apnea (adult) (pediatric): Secondary | ICD-10-CM | POA: Diagnosis not present

## 2019-07-30 LAB — VITAMIN D 25 HYDROXY (VIT D DEFICIENCY, FRACTURES): Vit D, 25-Hydroxy: 55 ng/mL (ref 30–100)

## 2019-07-30 MED ORDER — AMMONIUM LACTATE 12 % EX CREA
TOPICAL_CREAM | CUTANEOUS | 2 refills | Status: DC | PRN
Start: 2019-07-30 — End: 2020-09-23

## 2019-07-30 NOTE — Progress Notes (Signed)
Subjective: 69 year old female presents the office for follow-up evaluation of fifth metatarsal fracture.  Significant pain to the fracture site.  She is some other areas of discomfort that because she believes her feet swell and rubs in her shoes.  She denies any recent injury or falls in the last saw her.  She is having difficulty using the bone stimulator she is tried contacting the company.  Also asking the nails be trimmed today as they are starting it long again.  She also has a callus on the side of the big toe joint that she filed with a pumice stone she applies AmLactin.  She is asking for refill.  Denies any systemic complaints such as fevers, chills, nausea, vomiting. No acute changes since last appointment, and no other complaints at this time.   Objective: AAO x3, NAD DP/PT pulses palpable bilaterally, CRT less than 3 seconds No significant discomfort to palpation of the right foot on the fifth metatarsal or to other areas.  There is chronic edema to bilateral feet there is no erythema or warmth.  Flexor, extensor tendons appear to be intact.  Nails are mildly dystrophic, elongated nail discoloration.  Minimal hyperkeratotic tissue present first MPJ medially, medial hallux.  No other lesions. No pain with calf compression, swelling, warmth, erythema  Assessment: 69 year old female right fifth metatarsal fracture-nonunion; symptomatic onychomycosis, callus with  Plan: -All treatment options discussed with the patient including all alternatives, risks, complications.  -Repeat x-rays obtained reviewed of multiple views.  Radiolucent line still evident consistent with a nonunion across the fracture site. -She not having any pain currently to the foot metatarsal base she seems to be a symptomatic nonunion.  However I still check a vitamin D level which was ordered today.  Continue translator I contacted the rep from the company in order to make sure the bone stimulator is working correctly.   Reports that she has multiple, but it is a influencing the nonunion. -As a courtesy debride the nails without any complications or bleeding -Prescribed AmLactin  Return in about 6 weeks (around 09/10/2019).  Trula Slade DPM

## 2019-07-31 ENCOUNTER — Other Ambulatory Visit: Payer: Self-pay | Admitting: Allergy and Immunology

## 2019-07-31 ENCOUNTER — Other Ambulatory Visit: Payer: Self-pay | Admitting: Family Medicine

## 2019-07-31 DIAGNOSIS — J4550 Severe persistent asthma, uncomplicated: Secondary | ICD-10-CM

## 2019-08-04 ENCOUNTER — Telehealth: Payer: Self-pay | Admitting: *Deleted

## 2019-08-04 NOTE — Telephone Encounter (Signed)
-----   Message from Trula Slade, DPM sent at 07/31/2019 12:56 PM EDT ----- Please let her know the vitamin D level is normal.

## 2019-08-04 NOTE — Telephone Encounter (Signed)
Left message informing pt of Dr. Leigh Aurora review of results.

## 2019-08-06 DIAGNOSIS — M0609 Rheumatoid arthritis without rheumatoid factor, multiple sites: Secondary | ICD-10-CM | POA: Diagnosis not present

## 2019-08-06 MED FILL — GERHARDT'S BUTT CREAM BULK: 90 days supply | Qty: 180 | Fill #1

## 2019-08-12 ENCOUNTER — Encounter: Payer: Self-pay | Admitting: Family Medicine

## 2019-08-15 DIAGNOSIS — J471 Bronchiectasis with (acute) exacerbation: Secondary | ICD-10-CM | POA: Diagnosis not present

## 2019-08-15 DIAGNOSIS — I5032 Chronic diastolic (congestive) heart failure: Secondary | ICD-10-CM | POA: Diagnosis not present

## 2019-08-15 DIAGNOSIS — J4540 Moderate persistent asthma, uncomplicated: Secondary | ICD-10-CM | POA: Diagnosis not present

## 2019-08-15 DIAGNOSIS — R0602 Shortness of breath: Secondary | ICD-10-CM | POA: Diagnosis not present

## 2019-08-15 DIAGNOSIS — M0600 Rheumatoid arthritis without rheumatoid factor, unspecified site: Secondary | ICD-10-CM | POA: Diagnosis not present

## 2019-08-15 DIAGNOSIS — G894 Chronic pain syndrome: Secondary | ICD-10-CM | POA: Diagnosis not present

## 2019-08-15 DIAGNOSIS — J45909 Unspecified asthma, uncomplicated: Secondary | ICD-10-CM | POA: Diagnosis not present

## 2019-08-15 DIAGNOSIS — G4733 Obstructive sleep apnea (adult) (pediatric): Secondary | ICD-10-CM | POA: Diagnosis not present

## 2019-08-15 DIAGNOSIS — G4736 Sleep related hypoventilation in conditions classified elsewhere: Secondary | ICD-10-CM | POA: Diagnosis not present

## 2019-08-17 ENCOUNTER — Other Ambulatory Visit (HOSPITAL_COMMUNITY): Payer: Medicare PPO

## 2019-08-19 DIAGNOSIS — H6123 Impacted cerumen, bilateral: Secondary | ICD-10-CM | POA: Diagnosis not present

## 2019-08-19 DIAGNOSIS — H938X3 Other specified disorders of ear, bilateral: Secondary | ICD-10-CM | POA: Diagnosis not present

## 2019-08-19 DIAGNOSIS — L299 Pruritus, unspecified: Secondary | ICD-10-CM | POA: Diagnosis not present

## 2019-08-20 ENCOUNTER — Encounter: Payer: Self-pay | Admitting: Critical Care Medicine

## 2019-08-20 ENCOUNTER — Other Ambulatory Visit: Payer: Self-pay

## 2019-08-20 ENCOUNTER — Ambulatory Visit: Payer: Medicare PPO | Admitting: Critical Care Medicine

## 2019-08-20 ENCOUNTER — Ambulatory Visit (INDEPENDENT_AMBULATORY_CARE_PROVIDER_SITE_OTHER): Payer: Medicare PPO | Admitting: Critical Care Medicine

## 2019-08-20 VITALS — BP 150/60 | HR 88 | Temp 98.8°F | Ht 61.0 in | Wt 146.0 lb

## 2019-08-20 DIAGNOSIS — J4550 Severe persistent asthma, uncomplicated: Secondary | ICD-10-CM

## 2019-08-20 DIAGNOSIS — J398 Other specified diseases of upper respiratory tract: Secondary | ICD-10-CM

## 2019-08-20 DIAGNOSIS — R0609 Other forms of dyspnea: Secondary | ICD-10-CM

## 2019-08-20 DIAGNOSIS — J479 Bronchiectasis, uncomplicated: Secondary | ICD-10-CM

## 2019-08-20 DIAGNOSIS — R0600 Dyspnea, unspecified: Secondary | ICD-10-CM | POA: Diagnosis not present

## 2019-08-20 DIAGNOSIS — J471 Bronchiectasis with (acute) exacerbation: Secondary | ICD-10-CM | POA: Diagnosis not present

## 2019-08-20 DIAGNOSIS — D849 Immunodeficiency, unspecified: Secondary | ICD-10-CM

## 2019-08-20 DIAGNOSIS — M069 Rheumatoid arthritis, unspecified: Secondary | ICD-10-CM | POA: Diagnosis not present

## 2019-08-20 DIAGNOSIS — Z7901 Long term (current) use of anticoagulants: Secondary | ICD-10-CM

## 2019-08-20 DIAGNOSIS — Z86711 Personal history of pulmonary embolism: Secondary | ICD-10-CM

## 2019-08-20 DIAGNOSIS — G4733 Obstructive sleep apnea (adult) (pediatric): Secondary | ICD-10-CM | POA: Diagnosis not present

## 2019-08-20 DIAGNOSIS — I5032 Chronic diastolic (congestive) heart failure: Secondary | ICD-10-CM | POA: Diagnosis not present

## 2019-08-20 LAB — PULMONARY FUNCTION TEST
DL/VA % pred: 83 %
DL/VA: 3.51 ml/min/mmHg/L
DLCO cor % pred: 64 %
DLCO cor: 11.33 ml/min/mmHg
DLCO unc % pred: 64 %
DLCO unc: 11.33 ml/min/mmHg
FEF 25-75 Post: 2.09 L/sec
FEF 25-75 Pre: 2.4 L/sec
FEF2575-%Change-Post: -13 %
FEF2575-%Pred-Post: 124 %
FEF2575-%Pred-Pre: 142 %
FEV1-%Change-Post: -3 %
FEV1-%Pred-Post: 76 %
FEV1-%Pred-Pre: 79 %
FEV1-Post: 1.49 L
FEV1-Pre: 1.55 L
FEV1FVC-%Change-Post: 0 %
FEV1FVC-%Pred-Pre: 118 %
FEV6-%Change-Post: -4 %
FEV6-%Pred-Post: 66 %
FEV6-%Pred-Pre: 69 %
FEV6-Post: 1.65 L
FEV6-Pre: 1.72 L
FEV6FVC-%Pred-Post: 104 %
FEV6FVC-%Pred-Pre: 104 %
FVC-%Change-Post: -4 %
FVC-%Pred-Post: 63 %
FVC-%Pred-Pre: 66 %
FVC-Post: 1.65 L
FVC-Pre: 1.72 L
Post FEV1/FVC ratio: 90 %
Post FEV6/FVC ratio: 100 %
Pre FEV1/FVC ratio: 90 %
Pre FEV6/FVC Ratio: 100 %
RV % pred: 76 %
RV: 1.57 L
TLC % pred: 80 %
TLC: 3.71 L

## 2019-08-20 MED ORDER — FLUCONAZOLE 100 MG PO TABS
100.0000 mg | ORAL_TABLET | Freq: Every day | ORAL | 0 refills | Status: DC
Start: 2019-08-20 — End: 2019-10-09

## 2019-08-20 MED ORDER — DOXYCYCLINE HYCLATE 100 MG PO TABS
100.0000 mg | ORAL_TABLET | Freq: Two times a day (BID) | ORAL | 0 refills | Status: DC
Start: 2019-08-20 — End: 2019-10-05

## 2019-08-20 NOTE — Progress Notes (Signed)
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  Musculoskeletal:     Cervical back: Neck supple.     Comments: Symmetric bilateral lower extremity dependent edema to the knees.  Compression stockings in place.  Mild ulnar deviation of wrists.  Lymphadenopathy:     Cervical: No cervical adenopathy.  Skin:    General: Skin is warm and dry.     Findings: No rash.  Neurological:     General: No focal deficit present.     Mental Status: She is alert.     Coordination: Coordination normal.  Psychiatric:        Mood and Affect: Mood normal.        Behavior: Behavior normal.      CBC    Component Value Date/Time   WBC 12.0 (H) 05/07/2019 0953   RBC 3.78 (L) 05/07/2019 0953   HGB 13.0 05/07/2019 0953   HCT 38.1 05/07/2019 0953   PLT 198 05/07/2019 0953   MCV 100.8 (H) 05/07/2019 0953   MCH 34.4 (H) 05/07/2019 0953   MCHC 34.1 05/07/2019 0953   RDW 12.3 05/07/2019 0953   LYMPHSABS 1,176 05/07/2019 0953   MONOABS 0.8 07/29/2018 1014   EOSABS 84 05/07/2019 0953   BASOSABS 48 05/07/2019 0953    Allergy panel 04/02/2016-increased IgE for Aspergillus, otherwise negative Alpha-1 antitrypsin 170   Recent bronchoscopy 12/24/2018 (performed at [facility]) Neutrophils 12% Lymphocytes  1% Macrophages 79% Respiratory viral panel negative Unable to see PJP DNA Respiratory culture-1+ mixed gram-negative rods, normal flora Fungus culture negative AFB negative   Chest Imaging- films reviewed: CT high-resolution 10/24/2018-mild bronchiectasis right middle lobe, lingula, right lower lobe, but does taper more appropriately distally.  Persistent nodules in right middle lobe and lingula with mild tree-in-bud opacities.  No inter or intralobular septal thickening or significant groundglass.  Expiratory images with some mosaicism and significant bronchial and tracheal collapse, especially on the right.   Pulmonary Functions Testing Results: PFT Results Latest Ref Rng & Units 08/20/2019 09/18/2018 06/19/2017 08/08/2015 04/29/2015  FVC-Pre L 1.72 1.74 2.00 2.08 2.24  FVC-Predicted Pre % 66 66 74 75 81  FVC-Post L 1.65 1.61 1.86 2.17 2.25  FVC-Predicted Post % 63 61 69 78 81  Pre FEV1/FVC % % 90 88 90 83 87  Post FEV1/FCV % % 90 91  93 87 84  FEV1-Pre L 1.55 1.53 1.80 1.73 1.95  FEV1-Predicted Pre % 79 76 88 82 92  FEV1-Post L 1.49 1.47 1.72 1.89 1.89  DLCO UNC% % 64 77 66 60 63  DLCO COR %Predicted % 83 92 85 94 81  TLC L 3.71 3.52 3.95 - 3.91  TLC % Predicted % 80 76 85 - 84  RV % Predicted % 76 79 86 - 83   2020-no significant obstruction, mild restriction, mild DLCO reduction.  Flow volume loops consistent with restriction.  2021-obstruction or bronchodilator reversibility.  No significant restriction.  Mild diffusion impairment.  Compared to 2020, mild improvement.  Previous respiratory cultures- all fungal and AFB negative from right middle lobe and lingula during bronchoscopy 08/2016, only positive for normal flora   Pathology 08/23/2016: BAL lingula-no AFB or fungus  Echocardiogram: Unable to review report from 02/2018.  08/05/2017 demonstrates LVEF 65 to 40%, grade 1 diastolic dysfunction.  Normal LA, RV, RA.  Trivial TR and MR, otherwise normal valves.  Echocardiogram  07/10/2019-LVEF greater than 75% with hyperdynamic function, moderate LVH with grade 1 diastolic dysfunction.  Normal LA, RV, RA.  Trivial AR, otherwise normal valves.     Assessment & Plan:     ICD-10-CM   1. Dyspnea on exertion  R06.00 CT Chest High Resolution  2. Chronic heart failure with preserved ejection fraction (HFpEF) (HCC)  I50.32   3. Severe persistent asthma without complication  J81.19   4. Bronchiectasis without complication (HCC)  J47.8 CT Chest High Resolution  5. 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 Chronic anticoagulation  Z79.01   7. History of pulmonary embolus (PE)  Z86.711   8. Immunocompromised ([place])  D84.9 CT Chest High Resolution  9. Rheumatoid arthritis, involving unspecified site, unspecified whether rheumatoid factor present ([place])  M06.9 CT Chest High Resolution  10. Bronchiectasis with acute exacerbation (HCC)  J47.1      Bronchiectasis & tracheobronchomalacia.  I am concerned that she may have a new worsening baseline after her recent exacerbation.  Currently with an acute exacerbation. -Doxycycline twice daily for 14 days; has penicillin allergy. -Continue twice daily nebulized hypertonic saline with flutter valve.  Recommend using flutter valve a third time per day while she is having increased sputum volume. -Continue CPAP with supplemental oxygen at night.  She has not been using CPAP during the day if she is having more severe shortness of breath as previously recommended. She will continue to follow up with Dr. Ander Slade in sleep medicine clinic.  I reviewed her records of her CPAP with Dr. Ander Slade, who does not feel that her CPAP needs adjustment. -Sputum culture if able to produce one.  If she does not improve with antibiotics, will require repeat sputum culture prior to additional courses of antibiotics.  Dyspnea on exertion-likely multifactorial, but my concern is this is primarily due to severity of her tracheobronchomalacia and  deconditioning.  Based on most recent PFTs her asthma is well controlled and she has not developed progressive restriction or decrease in FVC.  No evidence of RA- ILD on PFTs or CT scan. TNF-alpha inhibitors can cause ILD in some people, but she has never had previous evidence of this. All culture results from bronchoscopy negative for chronic infection. No anemia in March 2021.  -Continue triple inhaled therapy -Continue hypertonic saline twice daily with flutter valve-increase while acutely exacerbated. -Repeat PFTs reviewed today-fairly stable compared to 2019 and 2020. -Repeat HRCT chest for comparison. -We discussed that she  has multiple lung conditions that contribute to shortness of breath that do not have great treatment options.  I am worried that her shortness of breath is going to be a persistent difficult to control symptom.   Mild persistent asthma; although PFTs have failed to demonstrate obstruction recently, her CT scan demonstrates air trapping and significant airway collapse consistent with tracheobronchomalacia. Based on her PFTs not demonstrating obstruction, she is less likely to have obliterative bronchiolitis due to RA, which typically is seen in sero-positive RA. -Continue Dulera twice daily and Spiriva once daily -Up-to-date on seasonal flu, Covid, pneumonia vaccines.  Continue social distancing and mask wearing. -Continue albuterol as needed.  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  No indication of uncontrolled bleeding. -Continue Xarelto indefinitely -Could consider repeat leg ultrasound to confirm efficacy of Xarelto  Allergic rhinitis -Continue montelukast, Zyrtec, Flonase.  Continue azelastine-recommend twice daily.  Restrictive lung disease, suspect this may be at least due in part to her recent weight gain -Recommend continued physical activity as tolerated  GERD  -Continue PPI and Pepcid  Chronically  immunosuppressed; no evidence of opportunistic infection on recent BAL 12/24/2018 -Ongoing follow-up with rheumatology    RTC in 2 weeks after CT scan.  > 50 minutes spent on this encounter, including reviewing her chart, time sent face-to-face, and documentation.   Current Outpatient Medications:  .  acetaminophen (TYLENOL) 650 MG CR tablet, Take 1,300 mg by mouth every 8 (eight) hours as needed for pain., Disp: , Rfl:  .  albuterol (PROAIR HFA) 108 (90 Base) MCG/ACT inhaler, Inhale 2 puffs into the lungs every 4 (four) hours as needed for wheezing or shortness of breath., Disp: 18 g, Rfl: 5 .  Alpha-D-Galactosidase (BEANO PO), Take 1 capsule by mouth 3 (three) times daily as needed (gas/ bloating). , Disp: , Rfl:  .  Alpha-Lipoic Acid 600 MG CAPS, Take 600 mg by mouth daily. , Disp: , Rfl:  .  alum & mag hydroxide-simeth (MAALOX/MYLANTA) 200-200-20 MG/5ML suspension, Take 15 mLs by mouth every 4 (four) hours as needed for indigestion or heartburn., Disp: 355 mL, Rfl: 0 .  ammonium lactate (AMLACTIN) 12 % cream, Apply topically as needed for dry skin., Disp: 385 g, Rfl: 2 .  atorvastatin (LIPITOR) 10 MG tablet, TAKE 1 TABLET BY MOUTH DAILY, Disp: 90 tablet, Rfl: 3 .  azelastine (ASTELIN) 0.1 % nasal spray, USE 2 SPRAYS IN EACH NOSTRIL DAILY AS DIRECTED, Disp: 30 mL, Rfl: 5 .  Bacillus Coagulans-Inulin (ALIGN PREBIOTIC-PROBIOTIC PO), Take by mouth every evening., Disp: , Rfl:  .  CALCIUM-MAGNESIUM PO, Take 1 tablet by mouth 2 (two) times daily. , Disp: , Rfl:  .  cetirizine (ZYRTEC) 10 MG tablet, Take 5 mg by mouth every evening. , Disp: , Rfl:  .  cholecalciferol (VITAMIN D) 25 MCG (1000 UT) tablet, Take 1,000 Units by mouth daily. , Disp: , Rfl:  .  cyproheptadine (PERIACTIN) 4 MG tablet, Take 2 tablets (8 mg total) by mouth every evening., Disp: 180 tablet, Rfl: 2 .  denosumab (PROLIA) 60 MG/ML SOLN injection, Inject 60 mg into the skin every 6 (six) months. Administer in upper arm, thigh,  or abdomen, Disp: , Rfl:  .  dexlansoprazole (DEXILANT) 60 MG capsule, TAKE 1 CAPSULE BY MOUTH EACH MORNING, Disp: 90 capsule, Rfl: 3 .  diclofenac Sodium (VOLTAREN) 1 % GEL, APPLY 2 GRAMS TOPICALLY TWICE DAILY, Disp: 350 g, Rfl: 1 .  EPINEPHrine (EPIPEN 2-PAK) 0.3  mg/0.3 mL IJ SOAJ injection, Inject 0.3 mLs (0.3 mg total) into the muscle Once PRN., Disp: 0.3 mL, Rfl: 2 .  famotidine (PEPCID) 20 MG tablet, TAKE 1 TABLET BY MOUTH TWICE A DAY, Disp: 60 tablet, Rfl: 1 .  ferrous sulfate 325 (65 FE) MG EC tablet, Take 325 mg by mouth daily with breakfast., Disp: , Rfl:  .  fluticasone (FLONASE) 50 MCG/ACT nasal spray, Place 2 sprays into both nostrils daily., Disp: 16 g, Rfl: 2 .  furosemide (LASIX) 40 MG tablet, Take 1 tablet (40 mg total) by mouth 2 (two) times daily., Disp: 60 tablet, Rfl: 3 .  gabapentin (NEURONTIN) 600 MG tablet, Take 1 tablet (600 mg total) by mouth daily., Disp: 90 tablet, Rfl: 3 .  guaiFENesin (MUCINEX) 600 MG 12 hr tablet, Take by mouth 2 (two) times daily., Disp: , Rfl:  .  Lactase (LACTAID FAST ACT) 9000 units TABS, Take 18,000 Units by mouth 4 (four) times daily as needed (dairy consumption). , Disp: , Rfl:  .  leflunomide (ARAVA) 20 MG tablet, Take 20 mg by mouth daily., Disp: , Rfl:  .  lidocaine (LIDODERM) 5 %, Place 1 patch onto the skin daily as needed (pain). Remove & Discard patch within 12 hours or as directed by MD, Disp: , Rfl:  .  mirtazapine (REMERON SOL-TAB) 30 MG disintegrating tablet, DISSOLVE 1 TABLET BY MOUTH EVERY NIGHT AT BEDTIME, Disp: 90 tablet, Rfl: 1 .  mometasone (NASONEX) 50 MCG/ACT nasal spray, Place 2 sprays into the nose daily., Disp: 51 g, Rfl: 0 .  mometasone-formoterol (DULERA) 200-5 MCG/ACT AERO, Inhale 2 puffs into the lungs 2 (two) times daily., Disp: 39 g, Rfl: 1 .  montelukast (SINGULAIR) 10 MG tablet, TAKE 1 TABLET BY MOUTH DAILY, Disp: 30 tablet, Rfl: 1 .  mupirocin ointment (BACTROBAN) 2 %, Place 1 application into the nose at bedtime.  To prevent nose bleeds, Disp: 90 g, Rfl: 1 .  nystatin (MYCOSTATIN) 100000 UNIT/ML suspension, Swish and spit 36m's twice daily, Disp: 473 mL, Rfl: 3 .  nystatin-triamcinolone ointment (MYCOLOG), Apply topically 2 (two) times daily., Disp: 90 g, Rfl: 1 .  predniSONE (DELTASONE) 5 MG tablet, 10 mg. , Disp: , Rfl:  .  Prenatal Vit-Fe Fumarate-FA (PRENATAL MULTIVITAMIN) TABS tablet, Take 1 tablet by mouth daily at 12 noon., Disp: , Rfl:  .  Propylene Glycol (SYSTANE COMPLETE) 0.6 % SOLN, Apply to eye., Disp: , Rfl:  .  Respiratory Therapy Supplies (FLUTTER) DEVI, Use as directed, Disp: 1 each, Rfl: 0 .  rivaroxaban (XARELTO) 20 MG TABS tablet, TAKE 1 TABLET BY MOUTH DAILY WITH SUPPER, Disp: 90 tablet, Rfl: 3 .  sodium chloride HYPERTONIC 3 % nebulizer solution, Take by nebulization 2 (two) times daily., Disp: 750 mL, Rfl: 12 .  Spacer/Aero-Holding Chambers (AEROCHAMBER PLUS) inhaler, Use as instructed, Disp: 1 each, Rfl: 0 .  Tiotropium Bromide Monohydrate (SPIRIVA RESPIMAT) 2.5 MCG/ACT AERS, Inhale 1 puff into the lungs daily., Disp: 4 g, Rfl: 11 .  doxycycline (VIBRA-TABS) 100 MG tablet, Take 1 tablet (100 mg total) by mouth 2 (two) times daily., Disp: 24 tablet, Rfl: 0 .  fluconazole (DIFLUCAN) 100 MG tablet, Take 1 tablet (100 mg total) by mouth daily., Disp: 3 tablet, Rfl: 0   LJulian Hy DO [facility] 08/20/2019 7:38 PM

## 2019-08-20 NOTE — Patient Instructions (Addendum)
Thank you for visiting Dr. Carlis Abbott at [facility]. We recommend the following: Orders Placed This Encounter  Procedures  . CT Chest High Resolution   Orders Placed This Encounter  Procedures  . CT Chest High Resolution    First available    Standing Status:   Future    Standing Expiration Date:   08/19/2020    Order Specific Question:   ** REASON FOR EXAM (FREE TEXT)    Answer:   worsening DOE, concern for pneumonitis from TNF-alpha med for RA vs RA-ILD    Order Specific Question:   Preferred imaging location?    Answer:   [facility]    Order Specific Question:   Radiology Contrast Protocol - do NOT remove file path    Answer:   [email or web address]    Meds ordered this encounter  Medications  . doxycycline (VIBRA-TABS) 100 MG tablet    Sig: Take 1 tablet (100 mg total) by mouth 2 (two) times daily.    Dispense:  24 tablet    Refill:  0  . fluconazole (DIFLUCAN) 100 MG tablet    Sig: Take 1 tablet (100 mg total) by mouth daily.    Dispense:  3 tablet    Refill:  0    Return in about 2 weeks (around 09/03/2019).    Please do your part to reduce the spread of COVID-19.

## 2019-08-20 NOTE — Progress Notes (Signed)
Full PFT performed today. °

## 2019-08-25 ENCOUNTER — Other Ambulatory Visit: Payer: Self-pay

## 2019-08-25 ENCOUNTER — Other Ambulatory Visit: Payer: Medicare PPO

## 2019-08-25 DIAGNOSIS — R0600 Dyspnea, unspecified: Secondary | ICD-10-CM

## 2019-08-25 DIAGNOSIS — R0609 Other forms of dyspnea: Secondary | ICD-10-CM

## 2019-08-25 DIAGNOSIS — J479 Bronchiectasis, uncomplicated: Secondary | ICD-10-CM

## 2019-08-25 DIAGNOSIS — J398 Other specified diseases of upper respiratory tract: Secondary | ICD-10-CM

## 2019-08-25 DIAGNOSIS — D849 Immunodeficiency, unspecified: Secondary | ICD-10-CM

## 2019-08-25 DIAGNOSIS — M069 Rheumatoid arthritis, unspecified: Secondary | ICD-10-CM

## 2019-08-25 NOTE — Addendum Note (Signed)
Addended by: Lia Foyer R on: 08/25/2019 12:49 PM   Modules accepted: Orders

## 2019-08-26 ENCOUNTER — Telehealth: Payer: Self-pay | Admitting: Pulmonary Disease

## 2019-08-26 ENCOUNTER — Telehealth: Payer: Self-pay | Admitting: Family Medicine

## 2019-08-26 NOTE — Telephone Encounter (Signed)
Data supplied by patient from a CPAP was reviewed-she provided this data on a piece of paper.  She has events trending around 5 events per hour or a little higher, more so recently.  Can we get a download from her machine-preferably a 36-month download to review either through [place] or her DME company?   I will probably want to increase her pressure by a couple of points to see whether the number of events noted per hour can be improved.

## 2019-08-26 NOTE — Telephone Encounter (Signed)
CB# [phone] Call for lab results

## 2019-08-26 NOTE — Telephone Encounter (Signed)
Labs have not yet been released 

## 2019-08-27 ENCOUNTER — Other Ambulatory Visit: Payer: Self-pay | Admitting: Allergy and Immunology

## 2019-08-27 DIAGNOSIS — J471 Bronchiectasis with (acute) exacerbation: Secondary | ICD-10-CM | POA: Diagnosis not present

## 2019-08-27 NOTE — Telephone Encounter (Signed)
Dr. Neldon Mc patient is requesting nystatin mouth suspension. After looking in patients chart I do see that you have prescribed this medication. Please advise if it is okay to send medication.

## 2019-08-27 NOTE — Telephone Encounter (Signed)
Rx nystatin suspension sent to [facility] and patient notified of medication sent.

## 2019-08-27 NOTE — Telephone Encounter (Signed)
Printed a 16 day download per your request. Placed in your box for review.

## 2019-08-27 NOTE — Telephone Encounter (Signed)
Okay to renew prescription

## 2019-08-28 ENCOUNTER — Ambulatory Visit (INDEPENDENT_AMBULATORY_CARE_PROVIDER_SITE_OTHER)
Admission: RE | Admit: 2019-08-28 | Discharge: 2019-08-28 | Disposition: A | Discharge status: Home / Self Care | Payer: Medicare PPO | Source: Ambulatory Visit | Attending: Critical Care Medicine | Admitting: Critical Care Medicine

## 2019-08-28 ENCOUNTER — Other Ambulatory Visit: Payer: Self-pay

## 2019-08-28 DIAGNOSIS — J479 Bronchiectasis, uncomplicated: Secondary | ICD-10-CM

## 2019-08-28 DIAGNOSIS — J398 Other specified diseases of upper respiratory tract: Secondary | ICD-10-CM | POA: Diagnosis not present

## 2019-08-28 DIAGNOSIS — J984 Other disorders of lung: Secondary | ICD-10-CM | POA: Diagnosis not present

## 2019-08-28 DIAGNOSIS — R0600 Dyspnea, unspecified: Secondary | ICD-10-CM

## 2019-08-28 DIAGNOSIS — I700 Atherosclerosis of aorta: Secondary | ICD-10-CM | POA: Diagnosis not present

## 2019-08-29 ENCOUNTER — Telehealth: Payer: Self-pay | Admitting: Critical Care Medicine

## 2019-08-29 DIAGNOSIS — G4733 Obstructive sleep apnea (adult) (pediatric): Secondary | ICD-10-CM | POA: Diagnosis not present

## 2019-08-29 MED ORDER — ONDANSETRON 4 MG PO TBDP
4.0000 mg | ORAL_TABLET | Freq: Four times a day (QID) | ORAL | 0 refills | Status: DC | PRN
Start: 2019-08-29 — End: 2019-10-20

## 2019-08-29 MED ORDER — LEVOFLOXACIN 750 MG PO TABS
750.0000 mg | ORAL_TABLET | ORAL | 0 refills | Status: DC
Start: 2019-08-29 — End: 2019-10-05

## 2019-08-29 NOTE — Progress Notes (Signed)
Please call and let her know that I have sent in antibiotics to her pharmacy and ensure that she picks them up. I also sent nausea medicine since this med is on her allergy list as causing nausea in the past. Thanks!

## 2019-08-29 NOTE — Telephone Encounter (Signed)
Sputum culture positive for pseudomonas (pan-sensitive). Will start 14 days of levofloxacin 500mg  every other day for AE bronchiectasis. Due to history of nausea, will prescribe zofran PRN.  Calculated Cr Cl 14  I was unable to reach her at either phone number. I left a message on her cell phone that antibiotics have been prescribed and she should stop the med and let us know if she develops joint discomfort.   Most recent QTc 421  Julian Hy, DO 08/29/19 6:28 PM [facility]

## 2019-08-31 ENCOUNTER — Telehealth: Payer: Self-pay | Admitting: Pulmonary Disease

## 2019-08-31 NOTE — Telephone Encounter (Signed)
Called ans spoke with patient she has picked up the 2 prescriptions and starting them today. She has an OV tomorrow and will follow up then. Nothing further needed at this time

## 2019-09-01 ENCOUNTER — Other Ambulatory Visit: Payer: Self-pay

## 2019-09-01 ENCOUNTER — Encounter: Payer: Self-pay | Admitting: Critical Care Medicine

## 2019-09-01 ENCOUNTER — Encounter: Payer: Self-pay | Admitting: Family Medicine

## 2019-09-01 ENCOUNTER — Ambulatory Visit: Payer: Medicare PPO | Admitting: Critical Care Medicine

## 2019-09-01 VITALS — BP 144/68 | HR 84 | Temp 95.0°F | Ht 61.0 in | Wt 145.0 lb

## 2019-09-01 DIAGNOSIS — D849 Immunodeficiency, unspecified: Secondary | ICD-10-CM

## 2019-09-01 DIAGNOSIS — J471 Bronchiectasis with (acute) exacerbation: Secondary | ICD-10-CM | POA: Diagnosis not present

## 2019-09-01 DIAGNOSIS — J479 Bronchiectasis, uncomplicated: Secondary | ICD-10-CM | POA: Diagnosis not present

## 2019-09-01 DIAGNOSIS — A498 Other bacterial infections of unspecified site: Secondary | ICD-10-CM

## 2019-09-01 NOTE — Progress Notes (Signed)
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 Bruises/bleeds easily.  Psychiatric/Behavioral: Negative.      Objective:   Vitals:   09/01/19 1541  BP: (!) 144/68  Pulse: 84  Temp: (!) 95 F (35 C)  TempSrc: Oral  SpO2: 95%  Weight: 145 lb (65.8 kg)  Height: _0  (1.549 m)   95% on  RA BMI Readings from Last 3 Encounters:  09/01/19 27.40 kg/m  08/20/19 27.59 kg/m  07/08/19 28.68 kg/m   Wt Readings from Last 3 Encounters:  09/01/19 145 lb (65.8 kg)  08/20/19 146 lb (66.2 kg)  07/08/19 151 lb 12.8 oz (68.9 kg)    Physical Exam Vitals reviewed.  Constitutional:      Comments: Cushingoid appearance  HENT:     Head: Normocephalic and atraumatic.  Eyes:     General: No scleral icterus. Cardiovascular:     Rate and Rhythm: Normal rate and regular rhythm.     Heart sounds: No murmur heard.   Pulmonary:     Comments: Mild tachypnea, no accessory muscle use. No rhonchi or wheezing. CTAB. Abdominal:     General: There is no distension.     Palpations: Abdomen is soft.  Musculoskeletal:     Cervical back: Neck supple.     Comments: Symmetric LE ankle edema.  Lymphadenopathy:     Cervical: No cervical adenopathy.  Skin:    General: Skin is warm and dry.     Findings: No rash.  Neurological:     General: No focal deficit present.     Mental Status: She is alert.      Coordination: Coordination normal.  Psychiatric:        Mood and Affect: Mood normal.        Behavior: Behavior normal.      CBC    Component Value Date/Time   WBC 12.0 (H) 05/07/2019 0953   RBC 3.78 (L) 05/07/2019 0953   HGB 13.0 05/07/2019 0953   HCT 38.1 05/07/2019 0953   PLT 198 05/07/2019 0953   MCV 100.8 (H) 05/07/2019 0953   MCH 34.4 (H) 05/07/2019 0953   MCHC 34.1 05/07/2019 0953   RDW 12.3 05/07/2019 0953   LYMPHSABS 1,176 05/07/2019 0953   MONOABS 0.8 07/29/2018 1014   EOSABS 84 05/07/2019 0953   BASOSABS 48 05/07/2019 0953    Allergy panel 04/02/2016-increased IgE for Aspergillus, otherwise negative Alpha-1 antitrypsin 170   Recent bronchoscopy 12/24/2018 (performed at [facility]) Neutrophils 12% Lymphocytes 1% Macrophages 79% Respiratory viral panel negative Unable to see PJP DNA Respiratory culture-1+ mixed gram-negative rods, normal flora Fungus culture negative  AFB negative  08/25/19 sputum- Pan-sensitive Pseudomonas   Chest Imaging- films reviewed: CT high-resolution 10/24/2018-mild bronchiectasis right middle lobe, lingula, right lower lobe, but does taper more appropriately distally.  Persistent nodules in right middle lobe and lingula with mild tree-in-bud opacities.  No inter or intralobular septal thickening or significant groundglass.  Expiratory images with some mosaicism and significant bronchial and tracheal collapse, especially on the right.   HRCT chest 08/30/19: RUL TIB opacities, multilobar bronchiectasis. Tracheobronchomalacia progressed since previous CT scan. Subacute rib fractures- healing. CAD.  Pulmonary Functions Testing Results: PFT Results Latest Ref Rng & Units 08/20/2019 09/18/2018 06/19/2017 08/08/2015 04/29/2015  FVC-Pre L 1.72 1.74 2.00 2.08 2.24  FVC-Predicted Pre % 66 66 74 75 81  FVC-Post L 1.65 1.61 1.86 2.17 2.25  FVC-Predicted Post % 63 61 69 78 81  Pre FEV1/FVC % % 90 88 90 83 87  Post FEV1/FCV % % 90 91 93 87 84   FEV1-Pre L 1.55 1.53 1.80 1.73 1.95  FEV1-Predicted Pre % 79 76 88 82 92  FEV1-Post L 1.49 1.47 1.72 1.89 1.89  DLCO UNC% % 64 77 66 60 63  DLCO COR %Predicted % 83 92 85 94 81  TLC L 3.71 3.52 3.95 - 3.91  TLC % Predicted % 80 76 85 - 84  RV % Predicted % 76 79 86 - 83   2020-no significant obstruction, mild restriction, mild DLCO reduction.  Flow volume loops consistent with restriction.  2021-obstruction or bronchodilator reversibility.  No significant restriction.  Mild diffusion impairment.  Compared to 2020, mild improvement.  Previous respiratory cultures- all fungal and AFB negative from right middle lobe and lingula during bronchoscopy 08/2016, only positive for normal flora   Pathology 08/23/2016: BAL lingula-no AFB or fungus  Echocardiogram: Unable to review report from 02/2018.  08/05/2017 demonstrates LVEF 65 to 57%, grade 1 diastolic dysfunction.  Normal LA, RV, RA.  Trivial TR and MR, otherwise normal valves.  Echocardiogram 07/10/2019-LVEF greater than 75% with hyperdynamic function, moderate LVH with grade 1 diastolic dysfunction.  Normal LA, RV, RA.  Trivial AR, otherwise normal valves.     Assessment & Plan:     ICD-10-CM   1. Pseudomonas aeruginosa infection  A49.8 Respiratory or Resp and Sputum Culture  2. Bronchiectasis without complication ([place])  D22.0   3. Immunocompromised ([place])  D84.9 Respiratory or Resp and Sputum Culture  4. Bronchiectasis with acute exacerbation ([facility])  J47.1 Respiratory or Resp and Sputum Culture     Bronchiectasis & tracheobronchomalacia-  Currently with an acute exacerbation 2/2 Pseudomonas. -Levofloxacin 535m every other day to complete 2 week course.  -Sputum culture after completion of antibiotics to ensure eradication. If not, then she needs to go back on quinolones and have a referral to ID for possible prolonged IV antibiotics to attempt eradication. -Continue twice daily nebulized hypertonic saline with flutter valve.   -Continue  CPAP with supplemental oxygen at night.  She has not been using CPAP during the day if she is having more severe shortness of breath as previously recommended. She will continue to follow up with Dr. [facility].    Dyspnea on exertion-likely multifactorial, but my concern is this is primarily due to severity of her tracheobronchomalacia and deconditioning. Pseudomonas colonization and infection are associated with more rapid progression of bronchiectasis and portend a poor prognosis. Based on most recent PFTs her asthma is well controlled and she has not developed progressive restriction or decrease in FVC.  No evidence of  RA- ILD on PFTs or CT scan. TNF-alpha inhibitors can cause ILD in some people, but she has never had previous evidence of this. All culture results from bronchoscopy negative for chronic infection. No anemia in March 2021.  -Continue triple inhaled therapy -Continue hypertonic saline BID with flutter. -Repeat PFTs reviewed today-fairly stable compared to 2019 and 2020. -HRCT chest reviewed during visit  Mild persistent asthma; although PFTs have failed to demonstrate obstruction recently, her CT scan demonstrates air trapping and significant airway collapse consistent with tracheobronchomalacia. Based on her PFTs not demonstrating obstruction, she is less likely to have obliterative bronchiolitis due to RA, which typically is seen in sero-positive RA. -Continue Dulera twice daily and Spiriva once daily. Rinse after Dulera. -Up-to-date on seasonal flu, Covid, pneumonia vaccines.  Continue social distancing and mask wearing. -Continue albuterol as needed.  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  No indication of uncontrolled bleeding. -Continue Xarelto indefinitely  Allergic rhinitis -Continue montelukast, Zyrtec, Flonase.  Continue azelastine twice daily.  Restrictive lung disease, suspect this may be at  least due in part to her recent weight gain -Recommend continued physical activity as tolerated  GERD  -Continue PPI and Pepcid  Chronically immunosuppressed; no evidence of opportunistic infection on recent BAL 12/24/2018 -Ongoing follow-up with rheumatology. If she has difficult to eradicate pseudomonas, it would be helpful to periodically decrease DMARDs during treatment.    RTC in 4 weeks.     Current Outpatient Medications:  .  acetaminophen (TYLENOL) 650 MG CR tablet, Take 1,300 mg by mouth every 8 (eight) hours as needed for pain., Disp: , Rfl:  .  albuterol (PROAIR HFA) 108 (90 Base) MCG/ACT inhaler, Inhale 2 puffs into the lungs every 4 (four) hours as needed for wheezing or shortness of breath., Disp: 18 g, Rfl: 5 .  Alpha-D-Galactosidase (BEANO PO), Take 1 capsule by mouth 3 (three) times daily as needed (gas/ bloating). , Disp: , Rfl:  .  Alpha-Lipoic Acid 600 MG CAPS, Take 600 mg by mouth daily. , Disp: , Rfl:  .  alum & mag hydroxide-simeth (MAALOX/MYLANTA) 200-200-20 MG/5ML suspension, Take 15 mLs by mouth every 4 (four) hours as needed for indigestion or heartburn., Disp: 355 mL, Rfl: 0 .  ammonium lactate (AMLACTIN) 12 % cream, Apply topically as needed for dry skin., Disp: 385 g, Rfl: 2 .  atorvastatin (LIPITOR) 10 MG tablet, TAKE 1 TABLET BY MOUTH DAILY, Disp: 90 tablet, Rfl: 3 .  azelastine (ASTELIN) 0.1 % nasal spray, USE 2 SPRAYS IN EACH NOSTRIL DAILY AS DIRECTED, Disp: 30 mL, Rfl: 5 .  Bacillus Coagulans-Inulin (ALIGN PREBIOTIC-PROBIOTIC PO), Take by mouth every evening., Disp: , Rfl:  .  CALCIUM-MAGNESIUM PO, Take 1 tablet by mouth 2 (two) times daily. , Disp: , Rfl:  .  cetirizine (ZYRTEC) 10 MG tablet, Take 5 mg by mouth every evening. , Disp: , Rfl:  .  cholecalciferol (VITAMIN D) 25 MCG (1000 UT) tablet, Take 1,000 Units by mouth daily. , Disp: , Rfl:  .  cyproheptadine (PERIACTIN) 4 MG tablet, Take 2 tablets (8 mg total) by mouth every evening., Disp: 180  tablet, Rfl: 2 .  denosumab (PROLIA) 60 MG/ML SOLN injection, Inject 60 mg into the skin every 6 (six) months. Administer in upper arm, thigh, or abdomen, Disp: , Rfl:  .  dexlansoprazole (DEXILANT) 60 MG capsule, TAKE 1 CAPSULE BY MOUTH EACH MORNING, Disp: 90 capsule, Rfl: 3 .  diclofenac Sodium (VOLTAREN) 1 %  GEL, APPLY 2 GRAMS TOPICALLY TWICE DAILY, Disp: 350 g, Rfl: 1 .  doxycycline (VIBRA-TABS) 100 MG tablet, Take 1 tablet (100 mg total) by mouth 2 (two) times daily., Disp: 24 tablet, Rfl: 0 .  EPINEPHrine (EPIPEN 2-PAK) 0.3 mg/0.3 mL IJ SOAJ injection, Inject 0.3 mLs (0.3 mg total) into the muscle Once PRN., Disp: 0.3 mL, Rfl: 2 .  famotidine (PEPCID) 20 MG tablet, TAKE 1 TABLET BY MOUTH TWICE A DAY, Disp: 60 tablet, Rfl: 1 .  ferrous sulfate 325 (65 FE) MG EC tablet, Take 325 mg by mouth daily with breakfast., Disp: , Rfl:  .  fluconazole (DIFLUCAN) 100 MG tablet, Take 1 tablet (100 mg total) by mouth daily., Disp: 3 tablet, Rfl: 0 .  fluticasone (FLONASE) 50 MCG/ACT nasal spray, Place 2 sprays into both nostrils daily., Disp: 16 g, Rfl: 2 .  furosemide (LASIX) 40 MG tablet, Take 1 tablet (40 mg total) by mouth 2 (two) times daily., Disp: 60 tablet, Rfl: 3 .  gabapentin (NEURONTIN) 600 MG tablet, Take 1 tablet (600 mg total) by mouth daily., Disp: 90 tablet, Rfl: 3 .  guaiFENesin (MUCINEX) 600 MG 12 hr tablet, Take by mouth 2 (two) times daily., Disp: , Rfl:  .  Lactase (LACTAID FAST ACT) 9000 units TABS, Take 18,000 Units by mouth 4 (four) times daily as needed (dairy consumption). , Disp: , Rfl:  .  leflunomide (ARAVA) 20 MG tablet, Take 20 mg by mouth daily., Disp: , Rfl:  .  levofloxacin (LEVAQUIN) 750 MG tablet, Take 1 tablet (750 mg total) by mouth every other day., Disp: 7 tablet, Rfl: 0 .  lidocaine (LIDODERM) 5 %, Place 1 patch onto the skin daily as needed (pain). Remove & Discard patch within 12 hours or as directed by MD, Disp: , Rfl:  .  mirtazapine (REMERON SOL-TAB) 30 MG  disintegrating tablet, DISSOLVE 1 TABLET BY MOUTH EVERY NIGHT AT BEDTIME, Disp: 90 tablet, Rfl: 1 .  mometasone (NASONEX) 50 MCG/ACT nasal spray, Place 2 sprays into the nose daily., Disp: 51 g, Rfl: 0 .  mometasone-formoterol (DULERA) 200-5 MCG/ACT AERO, Inhale 2 puffs into the lungs 2 (two) times daily., Disp: 39 g, Rfl: 1 .  montelukast (SINGULAIR) 10 MG tablet, TAKE 1 TABLET BY MOUTH DAILY, Disp: 30 tablet, Rfl: 1 .  mupirocin ointment (BACTROBAN) 2 %, Place 1 application into the nose at bedtime. To prevent nose bleeds, Disp: 90 g, Rfl: 1 .  nystatin (MYCOSTATIN) 100000 UNIT/ML suspension, SWISH AND SPIT 5 MLS TWICE DAILY, Disp: 473 mL, Rfl: 3 .  nystatin-triamcinolone ointment (MYCOLOG), Apply topically 2 (two) times daily., Disp: 90 g, Rfl: 1 .  ondansetron (ZOFRAN ODT) 4 MG disintegrating tablet, Take 1 tablet (4 mg total) by mouth every 6 (six) hours as needed for nausea or vomiting., Disp: 40 tablet, Rfl: 0 .  predniSONE (DELTASONE) 5 MG tablet, 10 mg. , Disp: , Rfl:  .  Prenatal Vit-Fe Fumarate-FA (PRENATAL MULTIVITAMIN) TABS tablet, Take 1 tablet by mouth daily at 12 noon., Disp: , Rfl:  .  Propylene Glycol (SYSTANE COMPLETE) 0.6 % SOLN, Apply to eye., Disp: , Rfl:  .  Respiratory Therapy Supplies (FLUTTER) DEVI, Use as directed, Disp: 1 each, Rfl: 0 .  rivaroxaban (XARELTO) 20 MG TABS tablet, TAKE 1 TABLET BY MOUTH DAILY WITH SUPPER, Disp: 90 tablet, Rfl: 3 .  sodium chloride HYPERTONIC 3 % nebulizer solution, Take by nebulization 2 (two) times daily., Disp: 750 mL, Rfl: 12 .  Spacer/Aero-Holding Chambers (AEROCHAMBER PLUS)  inhaler, Use as instructed, Disp: 1 each, Rfl: 0 .  Tiotropium Bromide Monohydrate (SPIRIVA RESPIMAT) 2.5 MCG/ACT AERS, Inhale 1 puff into the lungs daily., Disp: 4 g, Rfl: 11   Julian Hy, DO [facility] 09/01/2019 3:44 PM

## 2019-09-01 NOTE — Patient Instructions (Addendum)
Thank you for visiting Dr. Carlis Abbott at [facility]. We recommend the following:  Keep all medications the same. Bring your sputum sample in about 2 days after completing antibiotics.  Return in about 1 month (around 10/02/2019).    Please do your part to reduce the spread of COVID-19.

## 2019-09-04 ENCOUNTER — Other Ambulatory Visit: Payer: Self-pay

## 2019-09-04 ENCOUNTER — Ambulatory Visit: Payer: Medicare PPO | Admitting: Family Medicine

## 2019-09-04 VITALS — BP 130/60 | HR 96 | Temp 97.2°F | Ht 61.0 in | Wt 145.0 lb

## 2019-09-04 DIAGNOSIS — D539 Nutritional anemia, unspecified: Secondary | ICD-10-CM

## 2019-09-04 DIAGNOSIS — R739 Hyperglycemia, unspecified: Secondary | ICD-10-CM | POA: Diagnosis not present

## 2019-09-04 NOTE — Progress Notes (Signed)
Subjective:    Patient ID: Ebony Scott, female    DOB: 12/14/1950, 69 y.o.   MRN: [id number]  HPI  09/04/19 Patient is currently seeing rheumatology for rheumatoid arthritis.  She is currently on Simponi injections along with 15 mg of prednisone a day.  She has secondary Cushing syndrome.  She has the moonlike facies and is developing the buffalo hump.  Recently she had lab work obtained at her rheumatologist office which showed a decline in her hemoglobin from 13 in March to 10 now.  It was a macrocytic anemia with an MCV of 106.  Creatinine had also worsened from 1.8-2.0 and liver function test had increased from the mid 30s with AST to 50 and from the mid 50s with ALT to over 70.  She is here today to discuss this.  She recently had a sputum culture which grew Pseudomonas aeruginosa along with Candida albicans.  She is currently being treated by her pulmonologist with oral antibiotics.  She has a repeat sputum culture in 1 week to determine if there is resolution  Past Medical History:  Diagnosis Date  . Acute deep vein thrombosis (DVT) of right lower extremity ([place]) 12/13/2017  . Allergic rhinitis   . Allergy    SEASONAL  . Anemia   . Anxiety    pt denies  . Asthma   . Breast cancer ([place]) 1998   in remission  . Cataract    REMOVED  . Clostridium difficile colitis 01/14/2018  . Clostridium difficile diarrhea 01/14/2018  . Complication of anesthesia    "had hard time waking up from it several times" (02/20/2012)  . Depression    "some; don't take anything for it" (02/20/2012)  . Diverticulosis   . DVT (deep venous thrombosis) ([place])   . Exertional dyspnea   . Fibromyalgia 11/2011  . GERD (gastroesophageal reflux disease)   . Graves disease   . Headache(784.0)    "related to allergies; more at different times during the year" (02/20/2012)  . Hemorrhoids   . Hiatal hernia    back and neck  . Hx of adenomatous colonic polyps 04/12/2016  . Hypercholesteremia    good cholesterol is  high  . Hypothyroidism   . IBS (irritable bowel syndrome)   . Moderate persistent asthma    -FeV1 72% 2011, -IgE 102 2011, CT sinus Neg 2011  . Osteoporosis    on reclast yearly  . Pneumonia 04/2011; ~ 11/2011   "double; single" (02/20/2012)  . Seronegative rheumatoid arthritis ([place])    Dr. Lahoma Rocker  . SIRS (systemic inflammatory response syndrome) ([place]) 02/10/2018  . Tracheobronchomalacia    Past Surgical History:  Procedure Laterality Date  . ANTERIOR AND POSTERIOR REPAIR  1990's  . APPENDECTOMY    . BREAST LUMPECTOMY  1998   left  . CARPOMETACARPEL ([place]) FUSION OF THUMB WITH AUTOGRAFT FROM RADIUS  ~ 2009   "both thumbs" (02/20/2012)  . CATARACT EXTRACTION W/ INTRAOCULAR LENS  IMPLANT, BILATERAL  2012  . CERVICAL DISCECTOMY  10/2001   C5-C6  . CERVICAL FUSION  2003   C3-C4  . CHOLECYSTECTOMY    . COLONOSCOPY    . DEBRIDEMENT TENNIS ELBOW  ?1970's   right  . ESOPHAGOGASTRODUODENOSCOPY    . HYSTERECTOMY    . KNEE ARTHROPLASTY  ?1990's   "?right; w/cartilage repair" (02/20/2012)  . NASAL SEPTUM SURGERY  1980's  . POSTERIOR CERVICAL FUSION/FORAMINOTOMY  2004   "failed initial fusion; rewired  anterior neck" (02/20/2012)  . TONSILLECTOMY  ~  [place] FISTULA CLOSURE W/ TAH  1988  . VIDEO BRONCHOSCOPY Bilateral 08/23/2016   Procedure: VIDEO BRONCHOSCOPY WITH FLUORO;  Surgeon: Javier Glazier, MD;  Location: [facility];  Service: Cardiopulmonary;  Laterality: Bilateral;   Current Outpatient Medications on File Prior to Visit  Medication Sig Dispense Refill  . acetaminophen (TYLENOL) 650 MG CR tablet Take 1,300 mg by mouth every 8 (eight) hours as needed for pain.    Marland Kitchen albuterol (PROAIR HFA) 108 (90 Base) MCG/ACT inhaler Inhale 2 puffs into the lungs every 4 (four) hours as needed for wheezing or shortness of breath. 18 g 5  . Alpha-D-Galactosidase (BEANO PO) Take 1 capsule by mouth 3 (three) times daily as needed (gas/ bloating).     . Alpha-Lipoic Acid 600 MG  CAPS Take 600 mg by mouth daily.     Marland Kitchen alum & mag hydroxide-simeth (MAALOX/MYLANTA) 200-200-20 MG/5ML suspension Take 15 mLs by mouth every 4 (four) hours as needed for indigestion or heartburn. 355 mL 0  . ammonium lactate (AMLACTIN) 12 % cream Apply topically as needed for dry skin. 385 g 2  . atorvastatin (LIPITOR) 10 MG tablet TAKE 1 TABLET BY MOUTH DAILY 90 tablet 3  . azelastine (ASTELIN) 0.1 % nasal spray USE 2 SPRAYS IN EACH NOSTRIL DAILY AS DIRECTED 30 mL 5  . Bacillus Coagulans-Inulin (ALIGN PREBIOTIC-PROBIOTIC PO) Take by mouth every evening.    Marland Kitchen CALCIUM-MAGNESIUM PO Take 1 tablet by mouth 2 (two) times daily.     . cetirizine (ZYRTEC) 10 MG tablet Take 5 mg by mouth every evening.     . cholecalciferol (VITAMIN D) 25 MCG (1000 UT) tablet Take 1,000 Units by mouth daily.     . cyproheptadine (PERIACTIN) 4 MG tablet Take 2 tablets (8 mg total) by mouth every evening. 180 tablet 2  . denosumab (PROLIA) 60 MG/ML SOLN injection Inject 60 mg into the skin every 6 (six) months. Administer in upper arm, thigh, or abdomen    . dexlansoprazole (DEXILANT) 60 MG capsule TAKE 1 CAPSULE BY MOUTH EACH MORNING 90 capsule 3  . diclofenac Sodium (VOLTAREN) 1 % GEL APPLY 2 GRAMS TOPICALLY TWICE DAILY 350 g 1  . doxycycline (VIBRA-TABS) 100 MG tablet Take 1 tablet (100 mg total) by mouth 2 (two) times daily. 24 tablet 0  . EPINEPHrine (EPIPEN 2-PAK) 0.3 mg/0.3 mL IJ SOAJ injection Inject 0.3 mLs (0.3 mg total) into the muscle Once PRN. 0.3 mL 2  . famotidine (PEPCID) 20 MG tablet TAKE 1 TABLET BY MOUTH TWICE A DAY 60 tablet 1  . ferrous sulfate 325 (65 FE) MG EC tablet Take 325 mg by mouth daily with breakfast.    . fluconazole (DIFLUCAN) 100 MG tablet Take 1 tablet (100 mg total) by mouth daily. 3 tablet 0  . fluticasone (FLONASE) 50 MCG/ACT nasal spray Place 2 sprays into both nostrils daily. 16 g 2  . furosemide (LASIX) 40 MG tablet Take 1 tablet (40 mg total) by mouth 2 (two) times daily. 60 tablet 3   . gabapentin (NEURONTIN) 600 MG tablet Take 1 tablet (600 mg total) by mouth daily. 90 tablet 3  . guaiFENesin (MUCINEX) 600 MG 12 hr tablet Take by mouth 2 (two) times daily.    . Lactase (LACTAID FAST ACT) 9000 units TABS Take 18,000 Units by mouth 4 (four) times daily as needed (dairy consumption).     Marland Kitchen leflunomide (ARAVA) 20 MG tablet Take 20 mg by mouth daily.    Marland Kitchen levofloxacin (  LEVAQUIN) 750 MG tablet Take 1 tablet (750 mg total) by mouth every other day. 7 tablet 0  . lidocaine (LIDODERM) 5 % Place 1 patch onto the skin daily as needed (pain). Remove & Discard patch within 12 hours or as directed by MD    . mirtazapine (REMERON SOL-TAB) 30 MG disintegrating tablet DISSOLVE 1 TABLET BY MOUTH EVERY NIGHT AT BEDTIME 90 tablet 1  . mometasone (NASONEX) 50 MCG/ACT nasal spray Place 2 sprays into the nose daily. 51 g 0  . mometasone-formoterol (DULERA) 200-5 MCG/ACT AERO Inhale 2 puffs into the lungs 2 (two) times daily. 39 g 1  . montelukast (SINGULAIR) 10 MG tablet TAKE 1 TABLET BY MOUTH DAILY 30 tablet 1  . mupirocin ointment (BACTROBAN) 2 % Place 1 application into the nose at bedtime. To prevent nose bleeds 90 g 1  . nystatin (MYCOSTATIN) 100000 UNIT/ML suspension SWISH AND SPIT 5 MLS TWICE DAILY 473 mL 3  . nystatin-triamcinolone ointment (MYCOLOG) Apply topically 2 (two) times daily. 90 g 1  . ondansetron (ZOFRAN ODT) 4 MG disintegrating tablet Take 1 tablet (4 mg total) by mouth every 6 (six) hours as needed for nausea or vomiting. 40 tablet 0  . predniSONE (DELTASONE) 5 MG tablet 10 mg.     . Prenatal Vit-Fe Fumarate-FA (PRENATAL MULTIVITAMIN) TABS tablet Take 1 tablet by mouth daily at 12 noon.    Marland Kitchen Propylene Glycol (SYSTANE COMPLETE) 0.6 % SOLN Apply to eye.    Marland Kitchen Respiratory Therapy Supplies (FLUTTER) DEVI Use as directed 1 each 0  . rivaroxaban (XARELTO) 20 MG TABS tablet TAKE 1 TABLET BY MOUTH DAILY WITH SUPPER 90 tablet 3  . sodium chloride HYPERTONIC 3 % nebulizer solution Take  by nebulization 2 (two) times daily. 750 mL 12  . Spacer/Aero-Holding Chambers (AEROCHAMBER PLUS) inhaler Use as instructed 1 each 0  . Tiotropium Bromide Monohydrate (SPIRIVA RESPIMAT) 2.5 MCG/ACT AERS Inhale 1 puff into the lungs daily. 4 g 11   No current facility-administered medications on file prior to visit.   Allergies  Allergen Reactions  . Dust Mite Extract Shortness Of Breath and Other (See Comments)    "sneezing" (02/20/2012)  . Molds & Smuts Shortness Of Breath  . Morphine Sulfate Itching  . Other Shortness Of Breath and Other (See Comments)    Grass and weeds "sneezing; filled sinuses" (02/20/2012)  . Penicillins Rash and Other (See Comments)    "welts" (02/20/2012) Has patient had a PCN reaction causing immediate rash, facial/tongue/throat swelling, SOB or lightheadedness with hypotension: Unknown Has patient had a PCN reaction causing severe rash involving mucus membranes or skin necrosis: No Has patient had a PCN reaction that required hospitalization: No Has patient had a PCN reaction occurring within the last 10 years: No If all of the above answers are "NO", then may proceed with Cephalosporin use.   Marland Kitchen Reclast [Zoledronic Acid] Other (See Comments)    Fever, Put in hospital, dr said it was a reaction from a reaction   . Rofecoxib Swelling    Vioxx REACTION: feet swelling  . Shrimp Flavor Anaphylaxis    ALL SHELLFISH  . Tetracycline Hcl Nausea And Vomiting  . Xolair [Omalizumab] Other (See Comments)    Caused Blood clot  . Dilaudid [Hydromorphone Hcl] Itching  . Hydrocodone-Acetaminophen Nausea And Vomiting  . Levofloxacin Other (See Comments)    REACTION: GI upset  . Oxycodone Hcl Nausea And Vomiting  . Paroxetine Nausea And Vomiting    Paxil   . Celecoxib Swelling  Feet swelling  . Diltiazem Swelling  . Lactose Intolerance (Gi) Other (See Comments)    Bloating and gas  . Tree Extract Other (See Comments)    "tested and told I was allergic to it; never  experienced a reaction to it" (02/20/2012)   Social History   Socioeconomic History  . Marital status: Divorced    Spouse name: Not on file  . Number of children: 2  . Years of education: college  . Highest education level: Not on file  Occupational History  . Occupation: Disabled    Comment: Retired Engineer, production: RETIRED  Tobacco Use  . Smoking status: Passive Smoke Exposure - Never Smoker  . Smokeless tobacco: Never Used  . Tobacco comment: Parents  Vaping Use  . Vaping Use: Never used  Substance and Sexual Activity  . Alcohol use: Not Currently    Alcohol/week: 0.0 standard drinks  . Drug use: No  . Sexual activity: Never  Other Topics Concern  . Not on file  Social History Narrative   Patient lives at home alone. Patient  divorced.    Patient has her BS degree.   Right handed.   Caffeine- sometimes coffee.      [facility]:   Born in [place], [place]. She worked as a Copywriter, advertising. She has no pets currently. She does have indoor plants. Previously had mold in her home that was remediated. Carpet was removed.          Social Determinants of Health   Financial Resource Strain:   . Difficulty of Paying Living Expenses:   Food Insecurity:   . Worried About Charity fundraiser in the Last Year:   . Arboriculturist in the Last Year:   Transportation Needs:   . Film/video editor (Medical):   Marland Kitchen Lack of Transportation (Non-Medical):   Physical Activity:   . Days of Exercise per Week:   . Minutes of Exercise per Session:   Stress:   . Feeling of Stress :   Social Connections:   . Frequency of Communication with Friends and Family:   . Frequency of Social Gatherings with Friends and Family:   . Attends Religious Services:   . Active Member of Clubs or Organizations:   . Attends Archivist Meetings:   Marland Kitchen Marital Status:   Intimate Partner Violence:   . Fear of Current or Ex-Partner:   . Emotionally Abused:   Marland Kitchen  Physically Abused:   . Sexually Abused:      Review of Systems  All other systems reviewed and are negative.      Objective:   Physical Exam Vitals reviewed.  Constitutional:      General: She is not in acute distress.    Appearance: Normal appearance. She is well-developed. She is not ill-appearing or toxic-appearing.  HENT:     Right Ear: Tympanic membrane and ear canal normal.     Left Ear: Tympanic membrane and ear canal normal.     Nose: No congestion or rhinorrhea.  Cardiovascular:     Rate and Rhythm: Normal rate and regular rhythm.     Heart sounds: Normal heart sounds.  Pulmonary:     Effort: Pulmonary effort is normal. No respiratory distress.     Breath sounds: No stridor or decreased air movement. No wheezing or rales.  Abdominal:     General: Bowel sounds are normal. There is no distension.     Palpations: Abdomen  is soft.     Tenderness: There is no abdominal tenderness. There is no guarding.  Musculoskeletal:     Right lower leg: Edema present.     Left lower leg: Edema present.  Skin:    Findings: No erythema.  Neurological:     Mental Status: She is alert and oriented to person, place, and time.     Cranial Nerves: No cranial nerve deficit.     Sensory: No sensory deficit.     Motor: No weakness.     Coordination: Coordination normal.   Moon facies, buffalo hump       Assessment & Plan:  Macrocytic anemia - Plan: CBC with Differential/Platelet, Vitamin B12, COMPLETE METABOLIC PANEL WITH GFR, Fecal Globin By Immunochemistry  Elevated blood sugar - Plan: Hemoglobin A1c  Given the macrocytic anemia, I am concerned about B12 deficiency.  Therefore I recommended that we check a B12 level.  Her most recent random blood sugar was 186 I will check a hemoglobin A1c as well.  Given the fact she is on chronic anticoagulation with Xarelto and her hemoglobin recently dropped 3 points I will also check her stool for blood.  Recommended that she try to wean down  on the prednisone given her current infection to 12.5 mg a day to see if she can tolerate a lower dose at least until she sees her rheumatologist in August.

## 2019-09-05 LAB — CBC WITH DIFFERENTIAL/PLATELET
Absolute Monocytes: 874 cells/uL (ref 200–950)
Basophils Absolute: 31 cells/uL (ref 0–200)
Basophils Relative: 0.3 %
Eosinophils Absolute: 10 cells/uL — ABNORMAL LOW (ref 15–500)
Eosinophils Relative: 0.1 %
HCT: 33.9 % — ABNORMAL LOW (ref 35.0–45.0)
Hemoglobin: 11 g/dL — ABNORMAL LOW (ref 11.7–15.5)
Lymphs Abs: 603 cells/uL — ABNORMAL LOW (ref 850–3900)
MCH: 32.7 pg (ref 27.0–33.0)
MCHC: 32.4 g/dL (ref 32.0–36.0)
MCV: 100.9 fL — ABNORMAL HIGH (ref 80.0–100.0)
MPV: 10.6 fL (ref 7.5–12.5)
Monocytes Relative: 8.4 %
Neutro Abs: 8882 cells/uL — ABNORMAL HIGH (ref 1500–7800)
Neutrophils Relative %: 85.4 %
Platelets: 247 10*3/uL (ref 140–400)
RBC: 3.36 10*6/uL — ABNORMAL LOW (ref 3.80–5.10)
RDW: 12.7 % (ref 11.0–15.0)
Total Lymphocyte: 5.8 %
WBC: 10.4 10*3/uL (ref 3.8–10.8)

## 2019-09-05 LAB — COMPLETE METABOLIC PANEL WITH GFR
AG Ratio: 1.5 (calc) (ref 1.0–2.5)
ALT: 64 U/L — ABNORMAL HIGH (ref 6–29)
AST: 48 U/L — ABNORMAL HIGH (ref 10–35)
Albumin: 3.5 g/dL — ABNORMAL LOW (ref 3.6–5.1)
Alkaline phosphatase (APISO): 44 U/L (ref 37–153)
BUN/Creatinine Ratio: 24 (calc) — ABNORMAL HIGH (ref 6–22)
BUN: 46 mg/dL — ABNORMAL HIGH (ref 7–25)
CO2: 24 mmol/L (ref 20–32)
Calcium: 8.9 mg/dL (ref 8.6–10.4)
Chloride: 98 mmol/L (ref 98–110)
Creat: 1.9 mg/dL — ABNORMAL HIGH (ref 0.60–0.93)
GFR, Est African American: 30 mL/min/{1.73_m2} — ABNORMAL LOW (ref >=60)
GFR, Est Non African American: 26 mL/min/{1.73_m2} — ABNORMAL LOW (ref >=60)
Globulin: 2.4 g/dL (calc) (ref 1.9–3.7)
Glucose, Bld: 219 mg/dL — ABNORMAL HIGH (ref 65–99)
Potassium: 3.9 mmol/L (ref 3.5–5.3)
Sodium: 138 mmol/L (ref 135–146)
Total Bilirubin: 0.5 mg/dL (ref 0.2–1.2)
Total Protein: 5.9 g/dL — ABNORMAL LOW (ref 6.1–8.1)

## 2019-09-05 LAB — HEMOGLOBIN A1C
Hgb A1c MFr Bld: 6.7 % of total Hgb — ABNORMAL HIGH (ref <5.7)
Mean Plasma Glucose: 146 (calc)
eAG (mmol/L): 8.1 (calc)

## 2019-09-05 LAB — VITAMIN B12: Vitamin B-12: 531 pg/mL (ref 200–1100)

## 2019-09-07 ENCOUNTER — Telehealth: Payer: Self-pay

## 2019-09-07 NOTE — Telephone Encounter (Signed)
Pt would like to know if something could be prescribed for pain? Also asked what she needed to take for her Diabetes?

## 2019-09-08 ENCOUNTER — Other Ambulatory Visit: Payer: Self-pay

## 2019-09-08 ENCOUNTER — Encounter: Payer: Self-pay | Admitting: Allergy and Immunology

## 2019-09-08 ENCOUNTER — Ambulatory Visit: Payer: Medicare PPO | Admitting: Allergy and Immunology

## 2019-09-08 VITALS — BP 118/62 | HR 86 | Resp 16

## 2019-09-08 DIAGNOSIS — J3089 Other allergic rhinitis: Secondary | ICD-10-CM

## 2019-09-08 DIAGNOSIS — G500 Trigeminal neuralgia: Secondary | ICD-10-CM | POA: Diagnosis not present

## 2019-09-08 DIAGNOSIS — K219 Gastro-esophageal reflux disease without esophagitis: Secondary | ICD-10-CM

## 2019-09-08 DIAGNOSIS — D539 Nutritional anemia, unspecified: Secondary | ICD-10-CM | POA: Diagnosis not present

## 2019-09-08 DIAGNOSIS — H60322 Hemorrhagic otitis externa, left ear: Secondary | ICD-10-CM | POA: Diagnosis not present

## 2019-09-08 DIAGNOSIS — G478 Other sleep disorders: Secondary | ICD-10-CM

## 2019-09-08 NOTE — Patient Instructions (Addendum)
  1. 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 Periactin 4 mg tablet  2 tablets at bedtime  2. Continue to Treat laryngopharyngeal reflux:    A. Dexilant 60 mg in the morning  B. famotidine 40 mg daily  4.  Use Astelin and Zyrtec and nasal saline and nasal bactroban if needed  5.  Continue therapy directed by pulmonology and rheumatology  6.  Revisit with Dr. Wilburn Cornelia about left ear pain.  7. Obtain fall flu vaccine  8.  Return to clinic in 6 months or earlier if problem

## 2019-09-08 NOTE — Progress Notes (Signed)
[facility] - [facility] - [facility]   Follow-up Note  Referring Provider: [name], MD Primary Provider: [name], MD Date of Office Visit: 09/08/2019  Subjective:   Ebony Scott (DOB: 1950/11/07) is a 69 y.o. female who returns to the [facility] on 09/08/2019 in re-evaluation of the following:  HPI: Ebony Scott returns to this clinic in evaluation of allergic rhinoconjunctivitis and facial pain syndrome and sleep dysfunction and a history of reflux as well as a history of asthma, tracheal bronchial malacia, bronchiectasis, and rheumatoid arthritis followed by both pulmonary and rheumatology.  I last saw her in this clinic on 17 March 2019.  She has really done well with her facial pain syndrome and sleep dysfunction while continuing on Periactin at bedtime.  She has had very little issues with her nose.  She believes that her reflux is under very good control at this Scott in time and her obstructive swallowing events is averaging out to about 2 times per month which are easily handled usually with a water chase.  Recently she developed left ear pain of about 3 weeks duration for which she saw Dr. Wilburn Scott, ENT, 2 weeks ago and was given hydrocortisone drops.  Unfortunately, since that visit she has had a bleed in her left ear confirmed by her primary care doctor last week and she still has lots of pain in that ear.  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  She continues on immunosuppression with golimumab and currently 12.5 mg of prednisone daily.  She has been using 2 L of oxygen in her CPAP machine which appears to be working quite well.  She has obtain 2 Moderna Covid vaccinations.  Allergies as of 09/08/2019      Reactions   Dust Mite Extract Shortness Of  Breath, Other (See Comments)   "sneezing" (02/20/2012)   Molds & Smuts Shortness Of Breath   Morphine Sulfate Itching   Other Shortness Of Breath, Other (See Comments)   Grass and weeds "sneezing; filled sinuses" (02/20/2012)   Penicillins Rash, Other (See Comments)   "welts" (02/20/2012) Has patient had a PCN reaction causing immediate rash, facial/tongue/throat swelling, SOB or lightheadedness with hypotension: Unknown Has patient had a PCN reaction causing severe rash involving mucus membranes or skin necrosis: No Has patient had a PCN reaction that required hospitalization: No Has patient had a PCN reaction occurring within the last 10 years: No If all of the above answers are "NO", then may proceed with Cephalosporin use.   Reclast [zoledronic Acid] Other (See Comments)   Fever, Put in hospital, dr said it was a reaction from a reaction    Rofecoxib Swelling   Vioxx REACTION: feet swelling   Shrimp Flavor Anaphylaxis   ALL SHELLFISH   Tetracycline Hcl Nausea And Vomiting   Xolair [omalizumab] Other (See Comments)   Caused Blood clot   Dilaudid [hydromorphone Hcl] Itching   Hydrocodone-acetaminophen Nausea And Vomiting   Levofloxacin Other (See Comments)   REACTION: GI upset   Oxycodone Hcl Nausea And Vomiting   Paroxetine Nausea And Vomiting   Paxil   Celecoxib Swelling   Feet swelling   Diltiazem Swelling   Lactose Intolerance (gi) Other (See Comments)   Bloating and gas   Tree Extract Other (See  Comments)   "tested and told I was allergic to it; never experienced a reaction to it" (02/20/2012)      Medication List      acetaminophen 650 MG CR tablet Commonly known as: TYLENOL Take 1,300 mg by mouth every 8 (eight) hours as needed for pain.   AeroChamber Plus inhaler Use as instructed   albuterol 108 (90 Base) MCG/ACT inhaler Commonly known as: ProAir HFA Inhale 2 puffs into the lungs every 4 (four) hours as needed for wheezing or shortness of breath.   ALIGN  PREBIOTIC-PROBIOTIC PO Take by mouth every evening.   Alpha-Lipoic Acid 600 MG Caps Take 600 mg by mouth daily.   alum & mag hydroxide-simeth 200-200-20 MG/5ML suspension Commonly known as: MAALOX/MYLANTA Take 15 mLs by mouth every 4 (four) hours as needed for indigestion or heartburn.   ammonium lactate 12 % cream Commonly known as: AMLACTIN Apply topically as needed for dry skin.   atorvastatin 10 MG tablet Commonly known as: LIPITOR TAKE 1 TABLET BY MOUTH DAILY   azelastine 0.1 % nasal spray Commonly known as: ASTELIN USE 2 SPRAYS IN EACH NOSTRIL DAILY AS DIRECTED   BEANO PO Take 1 capsule by mouth 3 (three) times daily as needed (gas/ bloating).   CALCIUM-MAGNESIUM PO Take 1 tablet by mouth 2 (two) times daily.   cetirizine 10 MG tablet Commonly known as: ZYRTEC Take 5 mg by mouth every evening.   cholecalciferol 25 MCG (1000 UNIT) tablet Commonly known as: VITAMIN D Take 1,000 Units by mouth daily.   cyproheptadine 4 MG tablet Commonly known as: PERIACTIN Take 2 tablets (8 mg total) by mouth every evening.   denosumab 60 MG/ML Soln injection Commonly known as: PROLIA Inject 60 mg into the skin every 6 (six) months. Administer in upper arm, thigh, or abdomen   Dexilant 60 MG capsule Generic drug: dexlansoprazole TAKE 1 CAPSULE BY MOUTH EACH MORNING   diclofenac Sodium 1 % Gel Commonly known as: VOLTAREN APPLY 2 GRAMS TOPICALLY TWICE DAILY   doxycycline 100 MG tablet Commonly known as: VIBRA-TABS Take 1 tablet (100 mg total) by mouth 2 (two) times daily.   Dulera 200-5 MCG/ACT Aero Generic drug: mometasone-formoterol Inhale 2 puffs into the lungs 2 (two) times daily.   EPINEPHrine 0.3 mg/0.3 mL Soaj injection Commonly known as: EpiPen 2-Pak Inject 0.3 mLs (0.3 mg total) into the muscle Once PRN.   famotidine 20 MG tablet Commonly known as: PEPCID TAKE 1 TABLET BY MOUTH TWICE A DAY   ferrous sulfate 325 (65 FE) MG EC tablet Take 325 mg by mouth  daily with breakfast.   fluconazole 100 MG tablet Commonly known as: Diflucan Take 1 tablet (100 mg total) by mouth daily.   fluticasone 50 MCG/ACT nasal spray Commonly known as: FLONASE Scott 2 sprays into both nostrils daily.   Flutter Devi Use as directed   furosemide 40 MG tablet Commonly known as: LASIX Take 1 tablet (40 mg total) by mouth 2 (two) times daily.   gabapentin 600 MG tablet Commonly known as: NEURONTIN Take 1 tablet (600 mg total) by mouth daily.   guaiFENesin 600 MG 12 hr tablet Commonly known as: MUCINEX Take by mouth 2 (two) times daily.   Lactaid Fast Act 9000 units Tabs Generic drug: Lactase Take 18,000 Units by mouth 4 (four) times daily as needed (dairy consumption).   leflunomide 20 MG tablet Commonly known as: ARAVA Take 20 mg by mouth daily.   levofloxacin 750 MG tablet Commonly known as: LEVAQUIN  Take 1 tablet (750 mg total) by mouth every other day.   lidocaine 5 % Commonly known as: LIDODERM Scott 1 patch onto the skin daily as needed (pain). Remove & Discard patch within 12 hours or as directed by MD   mirtazapine 30 MG disintegrating tablet Commonly known as: REMERON SOL-TAB DISSOLVE 1 TABLET BY MOUTH EVERY NIGHT AT BEDTIME   mometasone 50 MCG/ACT nasal spray Commonly known as: NASONEX Scott 2 sprays into the nose daily.   montelukast 10 MG tablet Commonly known as: SINGULAIR TAKE 1 TABLET BY MOUTH DAILY   mupirocin ointment 2 % Commonly known as: BACTROBAN Scott 1 application into the nose at bedtime. To prevent nose bleeds   nystatin 100000 UNIT/ML suspension Commonly known as: MYCOSTATIN SWISH AND SPIT 5 MLS TWICE DAILY   nystatin-triamcinolone ointment Commonly known as: MYCOLOG Apply topically 2 (two) times daily.   ondansetron 4 MG disintegrating tablet Commonly known as: Zofran ODT Take 1 tablet (4 mg total) by mouth every 6 (six) hours as needed for nausea or vomiting.   predniSONE 5 MG tablet Commonly  known as: DELTASONE 10 mg.   prenatal multivitamin Tabs tablet Take 1 tablet by mouth daily at 12 noon.   rivaroxaban 20 MG Tabs tablet Commonly known as: Xarelto TAKE 1 TABLET BY MOUTH DAILY WITH SUPPER   sodium chloride HYPERTONIC 3 % nebulizer solution Take by nebulization 2 (two) times daily.   Spiriva Respimat 2.5 MCG/ACT Aers Generic drug: Tiotropium Bromide Monohydrate Inhale 1 puff into the lungs daily.   Systane Complete 0.6 % Soln Generic drug: Propylene Glycol Apply to eye.       Past Medical History:  Diagnosis Date  . Acute deep vein thrombosis (DVT) of right lower extremity ([place]) 12/13/2017  . Allergic rhinitis   . Allergy    SEASONAL  . Anemia   . Anxiety    pt denies  . Asthma   . Breast cancer ([place]) 1998   in remission  . Cataract    REMOVED  . Clostridium difficile colitis 01/14/2018  . Clostridium difficile diarrhea 01/14/2018  . Complication of anesthesia    "had hard time waking up from it several times" (02/20/2012)  . Depression    "some; don't take anything for it" (02/20/2012)  . Diverticulosis   . DVT (deep venous thrombosis) ([place])   . Exertional dyspnea   . Fibromyalgia 11/2011  . GERD (gastroesophageal reflux disease)   . Graves disease   . Headache(784.0)    "related to allergies; more at different times during the year" (02/20/2012)  . Hemorrhoids   . Hiatal hernia    back and neck  . Hx of adenomatous colonic polyps 04/12/2016  . Hypercholesteremia    good cholesterol is Ebony  . Hypothyroidism   . IBS (irritable bowel syndrome)   . Moderate persistent asthma    -FeV1 72% 2011, -IgE 102 2011, CT sinus Neg 2011  . Osteoporosis    on reclast yearly  . Pneumonia 04/2011; ~ 11/2011   "double; single" (02/20/2012)  . Seronegative rheumatoid arthritis ([place])    Dr. Lahoma Rocker  . SIRS (systemic inflammatory response syndrome) ([place]) 02/10/2018  . Tracheobronchomalacia     Past Surgical History:  Procedure Laterality Date  .  ANTERIOR AND POSTERIOR REPAIR  1990's  . APPENDECTOMY    . BREAST LUMPECTOMY  1998   left  . CARPOMETACARPEL ([place]) FUSION OF THUMB WITH AUTOGRAFT FROM RADIUS  ~ 2009   "both thumbs" (02/20/2012)  . CATARACT  EXTRACTION W/ INTRAOCULAR LENS  IMPLANT, BILATERAL  2012  . CERVICAL DISCECTOMY  10/2001   C5-C6  . CERVICAL FUSION  2003   C3-C4  . CHOLECYSTECTOMY    . COLONOSCOPY    . DEBRIDEMENT TENNIS ELBOW  ?1970's   right  . ESOPHAGOGASTRODUODENOSCOPY    . HYSTERECTOMY    . KNEE ARTHROPLASTY  ?1990's   "?right; w/cartilage repair" (02/20/2012)  . NASAL SEPTUM SURGERY  1980's  . POSTERIOR CERVICAL FUSION/FORAMINOTOMY  2004   "failed initial fusion; rewired  anterior neck" (02/20/2012)  . TONSILLECTOMY  ~ 1953  . VESICOVAGINAL FISTULA CLOSURE W/ TAH  1988  . VIDEO BRONCHOSCOPY Bilateral 08/23/2016   Procedure: VIDEO BRONCHOSCOPY WITH FLUORO;  Surgeon: Javier Glazier, MD;  Location: [facility];  Service: Cardiopulmonary;  Laterality: Bilateral;    Review of systems negative except as noted in HPI / PMHx or noted below:  Review of Systems  Constitutional: Negative.   HENT: Negative.   Eyes: Negative.   Respiratory: Negative.   Cardiovascular: Negative.   Gastrointestinal: Negative.   Genitourinary: Negative.   Musculoskeletal: Negative.   Skin: Negative.   Neurological: Negative.   Endo/Heme/Allergies: Negative.   Psychiatric/Behavioral: Negative.      Objective:   Vitals:   09/08/19 1129  BP: 118/62  Pulse: 86  Resp: 16  SpO2: 95%          Physical Exam Constitutional:      Appearance: She is not diaphoretic.  HENT:     Head: Normocephalic.     Right Ear: Tympanic membrane and external ear normal.     Left Ear: Tympanic membrane, ear canal and external ear normal.     Ears:     Comments: Dried clot of blood occluding 75% of left ear canal.  Tympanic membrane without any obvious erythema or perforation based on limited visual exam.    Nose: Nose normal. No  mucosal edema or rhinorrhea.     Mouth/Throat:     Pharynx: Uvula midline. No oropharyngeal exudate.  Eyes:     Conjunctiva/sclera: Conjunctivae normal.  Neck:     Thyroid: No thyromegaly.     Trachea: Trachea normal. No tracheal tenderness or tracheal deviation.  Cardiovascular:     Rate and Rhythm: Normal rate and regular rhythm.     Heart sounds: Normal heart sounds, S1 normal and S2 normal. No murmur heard.   Pulmonary:     Effort: No respiratory distress.     Breath sounds: Normal breath sounds. No stridor. No wheezing or rales.  Lymphadenopathy:     Head:     Right side of head: No tonsillar adenopathy.     Left side of head: No tonsillar adenopathy.     Cervical: No cervical adenopathy.  Skin:    Findings: No erythema or rash.     Nails: There is no clubbing.  Neurological:     Mental Status: She is alert.     Diagnostics:    Spirometry was performed and demonstrated an FEV1 of 1.39 at 71 % of predicted.  The patient had an Asthma Control Test with the following results: ACT Total Score: 20.    Assessment and Plan:   1. Facial pain syndrome   2. Other allergic rhinitis   3. Sleep dysfunction with sleep stage disturbance   4. LPRD (laryngopharyngeal reflux disease)   5. Acute hemorrhagic otitis externa of left ear     1. 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  Periactin 4 mg tablet  2 tablets at bedtime  2. Continue to Treat laryngopharyngeal reflux:    A. Dexilant 60 mg in the morning  B. famotidine 40 mg daily  4.  Use Astelin and Zyrtec and nasal saline and nasal bactroban if needed  5.  Continue therapy directed by pulmonology and rheumatology  6.  Revisit with Dr. Wilburn Scott about left ear pain.  7. Obtain fall flu vaccine  8.  Return to clinic in 6 months or earlier if problem   From the perspective of what we follow [place] for in this clinic, she is doing relatively well while remaining on Periactin and therapy for her upper  airway inflammation and therapy directed against reflux.  She appears to have some issue with her left ear and given the fact that this is an issue while she continues on topical hydrocortisone drops and oral levofloxacin I think she probably needs to go back to see Dr. Wilburn Scott concerning further management of this issue.  And, of course, she will need to follow-up with pulmonology and rheumatology regarding her very significant airway and joint issue.  Allena Katz, MD [facility]

## 2019-09-08 NOTE — Telephone Encounter (Signed)
I would be willing to give her tramadol for pain. I do not feel she needs meds for diabetes at this time especially if she can wean down on prednisone.

## 2019-09-09 ENCOUNTER — Encounter: Payer: Self-pay | Admitting: Allergy and Immunology

## 2019-09-09 DIAGNOSIS — H938X2 Other specified disorders of left ear: Secondary | ICD-10-CM | POA: Diagnosis not present

## 2019-09-10 ENCOUNTER — Ambulatory Visit: Payer: Medicare PPO | Admitting: Family Medicine

## 2019-09-10 ENCOUNTER — Other Ambulatory Visit: Payer: Self-pay

## 2019-09-10 ENCOUNTER — Ambulatory Visit: Payer: Medicare PPO | Admitting: Podiatry

## 2019-09-10 VITALS — BP 122/60 | HR 91 | Temp 97.7°F | Ht 61.0 in | Wt 148.0 lb

## 2019-09-10 DIAGNOSIS — N1832 Chronic kidney disease, stage 3b: Secondary | ICD-10-CM | POA: Diagnosis not present

## 2019-09-10 DIAGNOSIS — L84 Corns and callosities: Secondary | ICD-10-CM

## 2019-09-10 DIAGNOSIS — B351 Tinea unguium: Secondary | ICD-10-CM

## 2019-09-10 DIAGNOSIS — S92301K Fracture of unspecified metatarsal bone(s), right foot, subsequent encounter for fracture with nonunion: Secondary | ICD-10-CM

## 2019-09-10 DIAGNOSIS — E242 Drug-induced Cushing's syndrome: Secondary | ICD-10-CM

## 2019-09-10 DIAGNOSIS — R601 Generalized edema: Secondary | ICD-10-CM

## 2019-09-10 MED ORDER — DICLOFENAC SODIUM 1 % EX GEL: CUTANEOUS | 3 refills | Status: DC

## 2019-09-10 MED ORDER — TRAMADOL HCL 50 MG PO TABS: 50.0000 mg | ORAL_TABLET | Freq: Three times a day (TID) | ORAL | 0 refills | Status: AC | PRN

## 2019-09-10 NOTE — Progress Notes (Signed)
Subjective:    Patient ID: Ebony Scott, female    DOB: Sep 27, 1950, 69 y.o.   MRN: [id number]  HPI  09/04/19 Patient is currently seeing rheumatology for rheumatoid arthritis.  She is currently on Simponi injections along with 15 mg of prednisone a day.  She has secondary Cushing syndrome.  She has the moonlike facies and is developing the buffalo hump.  Recently she had lab work obtained at her rheumatologist office which showed a decline in her hemoglobin from 13 in March to 10 now.  It was a macrocytic anemia with an MCV of 106.  Creatinine had also worsened from 1.8-2.0 and liver function test had increased from the mid 30s with AST to 50 and from the mid 50s with ALT to over 70.  She is here today to discuss this.  She recently had a sputum culture which grew Pseudomonas aeruginosa along with Candida albicans.  She is currently being treated by her pulmonologist with oral antibiotics.  She has a repeat sputum culture in 1 week to determine if there is resolution.  At that time, my plan was: Given the macrocytic anemia, I am concerned about B12 deficiency.  Therefore I recommended that we check a B12 level.  Her most recent random blood sugar was 186 I will check a hemoglobin A1c as well.  Given the fact she is on chronic anticoagulation with Xarelto and her hemoglobin recently dropped 3 points I will also check her stool for blood.  Recommended that she try to wean down on the prednisone given her current infection to 12.5 mg a day to see if she can tolerate a lower dose at least until she sees her rheumatologist in August.  09/10/19 Here to discuss labs.  B12 was normal.  Hemoglobin was slightly better at 11 up from 10 at her recent doctor's appointment.  Liver function tests were essentially unchanged.  Hemoglobin A1c was 6.7 suggesting the development of type 2 diabetes.  Creatinine was essentially unchanged at 1.9.  Past Medical History:  Diagnosis Date   Acute deep vein thrombosis (DVT) of  right lower extremity (HCC) 12/13/2017   Allergic rhinitis    Allergy    SEASONAL   Anemia    Anxiety    pt denies   Asthma    Breast cancer ([place]) 1998   in remission   Cataract    REMOVED   Clostridium difficile colitis 01/14/2018   Clostridium difficile diarrhea 19/37/9024   Complication of anesthesia    "had hard time waking up from it several times" (02/20/2012)   Depression    "some; don't take anything for it" (02/20/2012)   Diverticulosis    DVT (deep venous thrombosis) (HCC)    Exertional dyspnea    Fibromyalgia 11/2011   GERD (gastroesophageal reflux disease)    Graves disease    Headache(784.0)    "related to allergies; more at different times during the year" (02/20/2012)   Hemorrhoids    Hiatal hernia    back and neck   Hx of adenomatous colonic polyps 04/12/2016   Hypercholesteremia    good cholesterol is high   Hypothyroidism    IBS (irritable bowel syndrome)    Moderate persistent asthma    -FeV1 72% 2011, -IgE 102 2011, CT sinus Neg 2011   Osteoporosis    on reclast yearly   Pneumonia 04/2011; ~ 11/2011   "double; single" (02/20/2012)   Seronegative rheumatoid arthritis ([place])    Dr. Lahoma Rocker   SIRS (systemic  inflammatory response syndrome) ([place]) 02/10/2018   Tracheobronchomalacia    Past Surgical History:  Procedure Laterality Date   ANTERIOR AND POSTERIOR REPAIR  1990's   APPENDECTOMY     BREAST LUMPECTOMY  1998   left   CARPOMETACARPEL ([place]) FUSION OF THUMB WITH AUTOGRAFT FROM RADIUS  ~ 2009   "both thumbs" (02/20/2012)   CATARACT EXTRACTION W/ INTRAOCULAR LENS  IMPLANT, BILATERAL  2012   CERVICAL DISCECTOMY  10/2001   C5-C6   CERVICAL FUSION  2003   C3-C4   CHOLECYSTECTOMY     COLONOSCOPY     DEBRIDEMENT TENNIS ELBOW  ?1970's   right   ESOPHAGOGASTRODUODENOSCOPY     HYSTERECTOMY     KNEE ARTHROPLASTY  ?1990's   "?right; w/cartilage repair" (02/20/2012)   NASAL SEPTUM SURGERY  1980's   POSTERIOR  CERVICAL FUSION/FORAMINOTOMY  2004   "failed initial fusion; rewired  anterior neck" (02/20/2012)   TONSILLECTOMY  ~ [place]   VIDEO BRONCHOSCOPY Bilateral 08/23/2016   Procedure: VIDEO BRONCHOSCOPY WITH FLUORO;  Surgeon: Javier Glazier, MD;  Location: [facility];  Service: Cardiopulmonary;  Laterality: Bilateral;   Current Outpatient Medications on File Prior to Visit  Medication Sig Dispense Refill   acetaminophen (TYLENOL) 650 MG CR tablet Take 1,300 mg by mouth every 8 (eight) hours as needed for pain.     albuterol (PROAIR HFA) 108 (90 Base) MCG/ACT inhaler Inhale 2 puffs into the lungs every 4 (four) hours as needed for wheezing or shortness of breath. 18 g 5   Alpha-D-Galactosidase (BEANO PO) Take 1 capsule by mouth 3 (three) times daily as needed (gas/ bloating).      Alpha-Lipoic Acid 600 MG CAPS Take 600 mg by mouth daily.      alum & mag hydroxide-simeth (MAALOX/MYLANTA) 200-200-20 MG/5ML suspension Take 15 mLs by mouth every 4 (four) hours as needed for indigestion or heartburn. 355 mL 0   ammonium lactate (AMLACTIN) 12 % cream Apply topically as needed for dry skin. 385 g 2   atorvastatin (LIPITOR) 10 MG tablet TAKE 1 TABLET BY MOUTH DAILY 90 tablet 3   azelastine (ASTELIN) 0.1 % nasal spray USE 2 SPRAYS IN EACH NOSTRIL DAILY AS DIRECTED 30 mL 5   Bacillus Coagulans-Inulin (ALIGN PREBIOTIC-PROBIOTIC PO) Take by mouth every evening.     CALCIUM-MAGNESIUM PO Take 1 tablet by mouth 2 (two) times daily.      cetirizine (ZYRTEC) 10 MG tablet Take 5 mg by mouth every evening.      cholecalciferol (VITAMIN D) 25 MCG (1000 UT) tablet Take 1,000 Units by mouth daily.      cyproheptadine (PERIACTIN) 4 MG tablet Take 2 tablets (8 mg total) by mouth every evening. 180 tablet 2   denosumab (PROLIA) 60 MG/ML SOLN injection Inject 60 mg into the skin every 6 (six) months. Administer in upper arm, thigh, or abdomen     dexlansoprazole  (DEXILANT) 60 MG capsule TAKE 1 CAPSULE BY MOUTH EACH MORNING 90 capsule 3   EPINEPHrine (EPIPEN 2-PAK) 0.3 mg/0.3 mL IJ SOAJ injection Inject 0.3 mLs (0.3 mg total) into the muscle Once PRN. 0.3 mL 2   famotidine (PEPCID) 20 MG tablet TAKE 1 TABLET BY MOUTH TWICE A DAY 60 tablet 1   ferrous sulfate 325 (65 FE) MG EC tablet Take 325 mg by mouth daily with breakfast.     fluconazole (DIFLUCAN) 100 MG tablet Take 1 tablet (100 mg total) by mouth daily. 3 tablet  0   fluticasone (FLONASE) 50 MCG/ACT nasal spray Place 2 sprays into both nostrils daily. 16 g 2   furosemide (LASIX) 40 MG tablet Take 1 tablet (40 mg total) by mouth 2 (two) times daily. 60 tablet 3   gabapentin (NEURONTIN) 600 MG tablet Take 1 tablet (600 mg total) by mouth daily. 90 tablet 3   guaiFENesin (MUCINEX) 600 MG 12 hr tablet Take by mouth 2 (two) times daily.     Lactase (LACTAID FAST ACT) 9000 units TABS Take 18,000 Units by mouth 4 (four) times daily as needed (dairy consumption).      leflunomide (ARAVA) 20 MG tablet Take 20 mg by mouth daily.     levofloxacin (LEVAQUIN) 750 MG tablet Take 1 tablet (750 mg total) by mouth every other day. 7 tablet 0   lidocaine (LIDODERM) 5 % Place 1 patch onto the skin daily as needed (pain). Remove & Discard patch within 12 hours or as directed by MD     mirtazapine (REMERON SOL-TAB) 30 MG disintegrating tablet DISSOLVE 1 TABLET BY MOUTH EVERY NIGHT AT BEDTIME 90 tablet 1   mometasone (NASONEX) 50 MCG/ACT nasal spray Place 2 sprays into the nose daily. 51 g 0   mometasone-formoterol (DULERA) 200-5 MCG/ACT AERO Inhale 2 puffs into the lungs 2 (two) times daily. 39 g 1   montelukast (SINGULAIR) 10 MG tablet TAKE 1 TABLET BY MOUTH DAILY 30 tablet 1   mupirocin ointment (BACTROBAN) 2 % Place 1 application into the nose at bedtime. To prevent nose bleeds 90 g 1   nystatin (MYCOSTATIN) 100000 UNIT/ML suspension SWISH AND SPIT 5 MLS TWICE DAILY 473 mL 3   nystatin-triamcinolone  ointment (MYCOLOG) Apply topically 2 (two) times daily. 90 g 1   ondansetron (ZOFRAN ODT) 4 MG disintegrating tablet Take 1 tablet (4 mg total) by mouth every 6 (six) hours as needed for nausea or vomiting. 40 tablet 0   predniSONE (DELTASONE) 5 MG tablet 10 mg.      Prenatal Vit-Fe Fumarate-FA (PRENATAL MULTIVITAMIN) TABS tablet Take 1 tablet by mouth daily at 12 noon.     Propylene Glycol (SYSTANE COMPLETE) 0.6 % SOLN Apply to eye.     Respiratory Therapy Supplies (FLUTTER) DEVI Use as directed 1 each 0   rivaroxaban (XARELTO) 20 MG TABS tablet TAKE 1 TABLET BY MOUTH DAILY WITH SUPPER 90 tablet 3   sodium chloride HYPERTONIC 3 % nebulizer solution Take by nebulization 2 (two) times daily. 750 mL 12   Spacer/Aero-Holding Chambers (AEROCHAMBER PLUS) inhaler Use as instructed 1 each 0   Tiotropium Bromide Monohydrate (SPIRIVA RESPIMAT) 2.5 MCG/ACT AERS Inhale 1 puff into the lungs daily. 4 g 11   doxycycline (VIBRA-TABS) 100 MG tablet Take 1 tablet (100 mg total) by mouth 2 (two) times daily. 24 tablet 0   No current facility-administered medications on file prior to visit.   Allergies  Allergen Reactions   Dust Mite Extract Shortness Of Breath and Other (See Comments)    "sneezing" (02/20/2012)   Molds & Smuts Shortness Of Breath   Morphine Sulfate Itching   Other Shortness Of Breath and Other (See Comments)    Grass and weeds "sneezing; filled sinuses" (02/20/2012)   Penicillins Rash and Other (See Comments)    "welts" (02/20/2012) Has patient had a PCN reaction causing immediate rash, facial/tongue/throat swelling, SOB or lightheadedness with hypotension: Unknown Has patient had a PCN reaction causing severe rash involving mucus membranes or skin necrosis: No Has patient had a PCN reaction that  required hospitalization: No Has patient had a PCN reaction occurring within the last 10 years: No If all of the above answers are "NO", then may proceed with Cephalosporin use.     Reclast [Zoledronic Acid] Other (See Comments)    Fever, Put in hospital, dr said it was a reaction from a reaction    Rofecoxib Swelling    Vioxx REACTION: feet swelling   Shrimp Flavor Anaphylaxis    ALL SHELLFISH   Tetracycline Hcl Nausea And Vomiting   Xolair [Omalizumab] Other (See Comments)    Caused Blood clot   Dilaudid [Hydromorphone Hcl] Itching   Hydrocodone-Acetaminophen Nausea And Vomiting   Levofloxacin Other (See Comments)    REACTION: GI upset   Oxycodone Hcl Nausea And Vomiting   Paroxetine Nausea And Vomiting    Paxil    Celecoxib Swelling    Feet swelling   Diltiazem Swelling   Lactose Intolerance (Gi) Other (See Comments)    Bloating and gas   Tree Extract Other (See Comments)    "tested and told I was allergic to it; never experienced a reaction to it" (02/20/2012)   Social History   Socioeconomic History   Marital status: Divorced    Spouse name: Not on file   Number of children: 2   Years of education: college   Highest education level: Not on file  Occupational History   Occupation: Disabled    Comment: Retired Engineer, production: RETIRED  Tobacco Use   Smoking status: Passive Smoke Exposure - Never Smoker   Smokeless tobacco: Never Used   Tobacco comment: Parents  Scientific laboratory technician Use: Never used  Substance and Sexual Activity   Alcohol use: Not Currently    Alcohol/week: 0.0 standard drinks   Drug use: No   Sexual activity: Never  Other Topics Concern   Not on file  Social History Narrative   Patient lives at home alone. Patient  divorced.    Patient has her BS degree.   Right handed.   Caffeine- sometimes coffee.      [facility]:   Born in [place], [place]. She worked as a Copywriter, advertising. She has no pets currently. She does have indoor plants. Previously had mold in her home that was remediated. Carpet was removed.          Social Determinants of Health   Financial  Resource Strain:    Difficulty of Paying Living Expenses:   Food Insecurity:    Worried About Charity fundraiser in the Last Year:    Arboriculturist in the Last Year:   Transportation Needs:    Film/video editor (Medical):    Lack of Transportation (Non-Medical):   Physical Activity:    Days of Exercise per Week:    Minutes of Exercise per Session:   Stress:    Feeling of Stress :   Social Connections:    Frequency of Communication with Friends and Family:    Frequency of Social Gatherings with Friends and Family:    Attends Religious Services:    Active Member of Clubs or Organizations:    Attends Music therapist:    Marital Status:   Intimate Partner Violence:    Fear of Current or Ex-Partner:    Emotionally Abused:    Physically Abused:    Sexually Abused:      Review of Systems  All other systems reviewed and are negative.  Objective:   Physical Exam Vitals reviewed.  Constitutional:      General: She is not in acute distress.    Appearance: Normal appearance. She is well-developed. She is not ill-appearing or toxic-appearing.  HENT:     Right Ear: Tympanic membrane and ear canal normal.     Left Ear: Tympanic membrane and ear canal normal.     Nose: No congestion or rhinorrhea.  Cardiovascular:     Rate and Rhythm: Normal rate and regular rhythm.     Heart sounds: Normal heart sounds.  Pulmonary:     Effort: Pulmonary effort is normal. No respiratory distress.     Breath sounds: No stridor or decreased air movement. No wheezing or rales.  Abdominal:     General: Bowel sounds are normal. There is no distension.     Palpations: Abdomen is soft.     Tenderness: There is no abdominal tenderness. There is no guarding.  Musculoskeletal:     Right lower leg: Edema present.     Left lower leg: Edema present.  Skin:    Findings: No erythema.  Neurological:     Mental Status: She is alert and oriented to person, place,  and time.     Cranial Nerves: No cranial nerve deficit.     Sensory: No sensory deficit.     Motor: No weakness.     Coordination: Coordination normal.   Moon facies, buffalo hump       Assessment & Plan:  Iatrogenic Cushing's syndrome (HCC)  Stage 3b chronic kidney disease  Anasarca  I explained to the patient that due to her Cushing syndrome and the use of prednisone she has developed type 2 diabetes.  I do not believe medication is necessary for the diabetes if we can wean the patient away from the prednisone.  Therefore I would wean the patient 2.5 mg every 2 weeks as low as tolerated until she can see her rheumatologist.  Given her normal iron level in January and her normal B12 level now, I believe that her anemia is most likely due to to chronic kidney disease due to lack of erythropoietin.  I will check her stool for blood and if there is no blood in her stool, I will consult nephrology due to the deterioration of her kidneys and if her hemoglobin drops below 10 she may require erythropoietin.  Patient continues to demonstrate swelling however I would not increase her Lasix further at the present time.  I will give the patient tramadol 50 mg every 8 hours as needed for pain in case she develops increasing pain after weaning down on the prednisone as she is unable to take Tylenol due to her elevated liver function test and she is not able to take NSAIDs due to her chronic kidney disease.

## 2019-09-11 ENCOUNTER — Other Ambulatory Visit: Payer: Self-pay | Admitting: Family Medicine

## 2019-09-11 DIAGNOSIS — N1832 Chronic kidney disease, stage 3b: Secondary | ICD-10-CM

## 2019-09-11 LAB — FECAL GLOBIN BY IMMUNOCHEMISTRY
FECAL GLOBIN RESULT:: NOT DETECTED
MICRO NUMBER:: 10737326
SPECIMEN QUALITY:: ADEQUATE

## 2019-09-14 ENCOUNTER — Telehealth: Payer: Self-pay | Admitting: Pulmonary Disease

## 2019-09-14 ENCOUNTER — Telehealth (HOSPITAL_COMMUNITY): Payer: Self-pay | Admitting: Pulmonary Disease

## 2019-09-14 DIAGNOSIS — G4736 Sleep related hypoventilation in conditions classified elsewhere: Secondary | ICD-10-CM | POA: Diagnosis not present

## 2019-09-14 DIAGNOSIS — G4733 Obstructive sleep apnea (adult) (pediatric): Secondary | ICD-10-CM | POA: Diagnosis not present

## 2019-09-14 DIAGNOSIS — R0602 Shortness of breath: Secondary | ICD-10-CM | POA: Diagnosis not present

## 2019-09-14 DIAGNOSIS — J45909 Unspecified asthma, uncomplicated: Secondary | ICD-10-CM | POA: Diagnosis not present

## 2019-09-14 DIAGNOSIS — I5032 Chronic diastolic (congestive) heart failure: Secondary | ICD-10-CM | POA: Diagnosis not present

## 2019-09-14 DIAGNOSIS — M0600 Rheumatoid arthritis without rheumatoid factor, unspecified site: Secondary | ICD-10-CM | POA: Diagnosis not present

## 2019-09-14 DIAGNOSIS — G894 Chronic pain syndrome: Secondary | ICD-10-CM | POA: Diagnosis not present

## 2019-09-14 DIAGNOSIS — J4540 Moderate persistent asthma, uncomplicated: Secondary | ICD-10-CM | POA: Diagnosis not present

## 2019-09-14 DIAGNOSIS — J471 Bronchiectasis with (acute) exacerbation: Secondary | ICD-10-CM | POA: Diagnosis not present

## 2019-09-14 NOTE — Telephone Encounter (Signed)
Patient calling to report she has had a recent pseudomonas infection. She has replaced all nebulizer tubing and mouth piece, new tooth brush, used bleach on her flutter valve, new head gear and chin stap for CPAP. Nothing further needed.

## 2019-09-14 NOTE — Telephone Encounter (Signed)
09/14/2019  Can you please ensure the patient remembers to provide a sputum sample after completing the 14-day Levaquin prescription that was provided at her last office visit with Dr. Carlis Abbott.  The instructions from that visit were for her to provide a sputum sample after completing this.  She should be completing the Levaquin within the next few days.  After that she will need to present to our office with a sputum sample.  Please ensure that she has a cup.  Please provide her the instructions on bringing sputum samples to our office.  Needs to be brought in with an 3 hours of producing it.  Wyn Quaker, FNP

## 2019-09-14 NOTE — Telephone Encounter (Signed)
ATC patient, unable to reach per DPR left detailed message on voicemail telling her to provide sputum sample after antibiotic and if she needed a sputum cup she can come to the office and get one.

## 2019-09-14 NOTE — Progress Notes (Signed)
Subjective: 69 year old female presents the office for follow-up evaluation of fifth metatarsal fracture.  States that she is still using the bone stimulator.  She wears her regular shoe when driving but wears a boot and she is on her feet a lot.  Still in some discomfort to the area but main issues she is having issues with the bone stimulator.  She has been in contact with the company.  Also asking for the nails be trimmed today as they are thick and she cannot do them herself. Denies any systemic complaints such as fevers, chills, nausea, vomiting. No acute changes since last appointment, and no other complaints at this time.   Objective: AAO x3, NAD DP/PT pulses palpable bilaterally, CRT less than 3 seconds No significant discomfort to palpation of the right foot on the fifth metatarsal or to other areas.  Flexor, extensor tendons appear to be intact.  MMT 5/5.  There is minimal edema to the fifth metatarsal base but there is no erythema or warmth.  No areas of skin breakdown.  Along the medial right second toe preulcerative area given the irritation of the first and second toes rubbing.  There is no surrounding erythema, drainage or pus or signs of infection. No pain with calf compression, swelling, warmth, erythema  Assessment: 69 year old female right fifth metatarsal fracture-nonunion; symptomatic onychomycosis, preulcerative callus second toe  Plan: -All treatment options discussed with the patient including all alternatives, risks, complications.  -She not been having significant pain to the fifth metatarsal base.  I do wonder continue bone stimulator and at this point we can try being on a regular shoe full-time to see if this is helping however there is any increase in pain or swelling she is to return to the cam boot. -Continue offloading for the second toe and I dispensed new toe separators. -As a courtesy debrided the nails with any complications or bleeding  Return in about 2  months (around 11/10/2019).  Trula Slade DPM

## 2019-09-15 NOTE — Telephone Encounter (Signed)
Tried calling the pt to see if she received our msg and there was no answer- LMTCB

## 2019-09-17 ENCOUNTER — Other Ambulatory Visit: Payer: Medicare PPO

## 2019-09-17 DIAGNOSIS — D849 Immunodeficiency, unspecified: Secondary | ICD-10-CM | POA: Diagnosis not present

## 2019-09-17 DIAGNOSIS — A498 Other bacterial infections of unspecified site: Secondary | ICD-10-CM | POA: Diagnosis not present

## 2019-09-17 DIAGNOSIS — J471 Bronchiectasis with (acute) exacerbation: Secondary | ICD-10-CM | POA: Diagnosis not present

## 2019-09-17 NOTE — Telephone Encounter (Signed)
Called and spoke with pt. Pt stated that she dropped off sputum sample today 7/29. Nothing further needed.

## 2019-09-19 LAB — RESPIRATORY CULTURE OR RESPIRATORY AND SPUTUM CULTURE: MICRO NUMBER:: 10764965

## 2019-09-21 LAB — FUNGUS CULTURE W SMEAR
MICRO NUMBER:: 10670459
SMEAR:: NONE SEEN
SPECIMEN QUALITY:: ADEQUATE

## 2019-09-21 LAB — RESPIRATORY CULTURE OR RESPIRATORY AND SPUTUM CULTURE
MICRO NUMBER:: 10670460
RESULT:: NORMAL
SPECIMEN QUALITY:: ADEQUATE

## 2019-09-21 NOTE — Progress Notes (Signed)
Please let her know the great news that the pseudomonas cleared with the antibiotics. If her symptoms are getting worse again (hopefully they're better currently), she needs to repeat a sputum sample because she is still at high risk for this to come back. The minimal candida that grew is unlikely to be significant. Thanks! LPC

## 2019-09-23 ENCOUNTER — Telehealth: Payer: Self-pay | Admitting: Family Medicine

## 2019-09-23 ENCOUNTER — Other Ambulatory Visit: Payer: Self-pay

## 2019-09-23 ENCOUNTER — Ambulatory Visit (INDEPENDENT_AMBULATORY_CARE_PROVIDER_SITE_OTHER): Payer: Medicare PPO

## 2019-09-23 ENCOUNTER — Other Ambulatory Visit: Payer: Medicare PPO

## 2019-09-23 DIAGNOSIS — A498 Other bacterial infections of unspecified site: Secondary | ICD-10-CM

## 2019-09-23 DIAGNOSIS — J479 Bronchiectasis, uncomplicated: Secondary | ICD-10-CM

## 2019-09-23 DIAGNOSIS — M810 Age-related osteoporosis without current pathological fracture: Secondary | ICD-10-CM | POA: Diagnosis not present

## 2019-09-23 DIAGNOSIS — N1832 Chronic kidney disease, stage 3b: Secondary | ICD-10-CM

## 2019-09-23 MED ORDER — DENOSUMAB 60 MG/ML ~~LOC~~ SOSY
60.0000 mg | PREFILLED_SYRINGE | SUBCUTANEOUS | Status: DC
  Administered 2019-09-23 – 2020-12-05 (×3): 60 mg via SUBCUTANEOUS

## 2019-09-23 NOTE — Telephone Encounter (Signed)
Pt lab order has been placed for in three months.

## 2019-09-23 NOTE — Telephone Encounter (Signed)
Pt is wanting to know when she should schedule for her next blood work. Pt just had blood work done couple a weeks ago.   Pt Call back [phone]

## 2019-09-23 NOTE — Progress Notes (Signed)
Please let Ebony Scott know that her sputum sample last week was not able to be used by the lab because it had more saliva than sputum. Hopefully this means she is producing less sputum since her antibiotics. If not, then we need to collect another. She is always free to drop it off at [facility]'s lab if that works best for her. Thanks! LPC

## 2019-09-27 DIAGNOSIS — J471 Bronchiectasis with (acute) exacerbation: Secondary | ICD-10-CM | POA: Diagnosis not present

## 2019-09-28 ENCOUNTER — Encounter: Payer: Self-pay | Admitting: Podiatry

## 2019-09-29 DIAGNOSIS — G4733 Obstructive sleep apnea (adult) (pediatric): Secondary | ICD-10-CM | POA: Diagnosis not present

## 2019-09-29 NOTE — Telephone Encounter (Signed)
Dr Carlis Abbott please advise, patient is asking if she is contagious.  Thanks!

## 2019-09-29 NOTE — Progress Notes (Signed)
Please let Ebony Scott know that her repeat sputum sample did not grow Pseudomonas but grew something that is probably a contaminant. I do not think additional antibiotics are needed at this time. Thanks!

## 2019-09-29 NOTE — Telephone Encounter (Signed)
Dr. Carlis Abbott, Please see patient comment and advise.  Thank you.

## 2019-09-30 NOTE — Telephone Encounter (Signed)
No thankfully this is something that is commonly found in the environment and we only tend to see in people who have bronchiectasis and other abnormalities in their lungs that increase their risk of pulmonary infections. You do not need to worry about other people catching this from you.

## 2019-10-01 ENCOUNTER — Other Ambulatory Visit: Payer: Medicare PPO

## 2019-10-01 DIAGNOSIS — M0609 Rheumatoid arthritis without rheumatoid factor, multiple sites: Secondary | ICD-10-CM | POA: Diagnosis not present

## 2019-10-05 ENCOUNTER — Other Ambulatory Visit: Payer: Self-pay

## 2019-10-05 ENCOUNTER — Encounter: Payer: Self-pay | Admitting: Podiatry

## 2019-10-05 ENCOUNTER — Ambulatory Visit (INDEPENDENT_AMBULATORY_CARE_PROVIDER_SITE_OTHER): Payer: Medicare PPO

## 2019-10-05 ENCOUNTER — Ambulatory Visit: Payer: Medicare PPO | Admitting: Podiatry

## 2019-10-05 DIAGNOSIS — S9002XA Contusion of left ankle, initial encounter: Secondary | ICD-10-CM

## 2019-10-05 DIAGNOSIS — S9032XA Contusion of left foot, initial encounter: Secondary | ICD-10-CM

## 2019-10-05 DIAGNOSIS — L97511 Non-pressure chronic ulcer of other part of right foot limited to breakdown of skin: Secondary | ICD-10-CM | POA: Diagnosis not present

## 2019-10-05 MED ORDER — CLINDAMYCIN HCL 300 MG PO CAPS
300.0000 mg | ORAL_CAPSULE | Freq: Three times a day (TID) | ORAL | 0 refills | Status: DC
Start: 2019-10-05 — End: 2019-10-20

## 2019-10-05 MED ORDER — MUPIROCIN 2 % EX OINT
1.0000 | TOPICAL_OINTMENT | Freq: Two times a day (BID) | CUTANEOUS | 2 refills | Status: DC
Start: 2019-10-05 — End: 2020-03-16

## 2019-10-09 ENCOUNTER — Ambulatory Visit: Payer: Medicare PPO | Admitting: Family Medicine

## 2019-10-09 ENCOUNTER — Other Ambulatory Visit: Payer: Self-pay

## 2019-10-09 ENCOUNTER — Encounter: Payer: Self-pay | Admitting: Family Medicine

## 2019-10-09 ENCOUNTER — Ambulatory Visit (INDEPENDENT_AMBULATORY_CARE_PROVIDER_SITE_OTHER): Payer: Medicare PPO | Admitting: Family Medicine

## 2019-10-09 VITALS — BP 124/64 | HR 92 | Temp 98.1°F | Resp 14 | Ht 61.0 in | Wt 147.0 lb

## 2019-10-09 DIAGNOSIS — R748 Abnormal levels of other serum enzymes: Secondary | ICD-10-CM | POA: Diagnosis not present

## 2019-10-09 DIAGNOSIS — S8012XA Contusion of left lower leg, initial encounter: Secondary | ICD-10-CM

## 2019-10-09 DIAGNOSIS — G454 Transient global amnesia: Secondary | ICD-10-CM

## 2019-10-09 DIAGNOSIS — W19XXXA Unspecified fall, initial encounter: Secondary | ICD-10-CM

## 2019-10-09 DIAGNOSIS — M79643 Pain in unspecified hand: Secondary | ICD-10-CM | POA: Diagnosis not present

## 2019-10-09 DIAGNOSIS — N1832 Chronic kidney disease, stage 3b: Secondary | ICD-10-CM | POA: Diagnosis not present

## 2019-10-09 DIAGNOSIS — N1831 Chronic kidney disease, stage 3a: Secondary | ICD-10-CM | POA: Diagnosis not present

## 2019-10-09 DIAGNOSIS — S060X1A Concussion with loss of consciousness of 30 minutes or less, initial encounter: Secondary | ICD-10-CM

## 2019-10-09 DIAGNOSIS — M810 Age-related osteoporosis without current pathological fracture: Secondary | ICD-10-CM | POA: Diagnosis not present

## 2019-10-09 DIAGNOSIS — M7989 Other specified soft tissue disorders: Secondary | ICD-10-CM | POA: Diagnosis not present

## 2019-10-09 DIAGNOSIS — Z7952 Long term (current) use of systemic steroids: Secondary | ICD-10-CM | POA: Diagnosis not present

## 2019-10-09 DIAGNOSIS — M0609 Rheumatoid arthritis without rheumatoid factor, multiple sites: Secondary | ICD-10-CM | POA: Diagnosis not present

## 2019-10-09 DIAGNOSIS — Z79899 Other long term (current) drug therapy: Secondary | ICD-10-CM | POA: Diagnosis not present

## 2019-10-12 ENCOUNTER — Encounter: Payer: Self-pay | Admitting: Podiatry

## 2019-10-12 DIAGNOSIS — Z01419 Encounter for gynecological examination (general) (routine) without abnormal findings: Secondary | ICD-10-CM | POA: Diagnosis not present

## 2019-10-12 DIAGNOSIS — Z1231 Encounter for screening mammogram for malignant neoplasm of breast: Secondary | ICD-10-CM | POA: Diagnosis not present

## 2019-10-12 DIAGNOSIS — Z6827 Body mass index (BMI) 27.0-27.9, adult: Secondary | ICD-10-CM | POA: Diagnosis not present

## 2019-10-12 NOTE — Progress Notes (Signed)
Subjective:    Patient ID: Ebony Scott, female    DOB: 1950/06/26, 69 y.o.   MRN: [id number]  HPI  Patient presents today to discuss her recent fall she had.  Patient states that she was walking up the steps to her deck.  She remembers losing her balance and starting to fall.  However she does not remember falling or hitting the ground or hitting her head.  Instead, her next recollection, is in her home.  She states that she was putting up some items that she had been using outside.  There are several minutes that she has no memory of.  There was obviously a trauma involved.  The patient has significant bruising over her left lateral hip, down her left leg, swelling around her left knee, bruising and swelling around her left ankle and significant bruising in the dorsum of her left foot.  She has seen her podiatrist who obtained x-rays that did show fractures in 2 metatarsals in her foot.  She does have some tenderness to palpation in that area.  She has minimal pain with range of motion in her left ankle and is able to bear weight on her ankle.  She has some stiffness in her knee but is able to flex and extend her knee as well as flex and extend her hip without pain.  My biggest concern is what caused the issue.  Differential diagnosis includes fall with concussion and amnesia related to the concussion.  Other potential causes would be a cardiac arrhythmia.  Also on the differential would be a seizure.  Patient denies biting her tongue.  She denies any bowel or bladder incontinence.  She has no history of seizures.  She does remember starting to lose her balance and trying to grab onto the stair rail to catch herself however that is her last memory.  We have made an appointment for her to see nephrology also discussing her renal function however this has not been scheduled until October.  Patient would like to try to expedite this Past Medical History:  Diagnosis Date  . Acute deep vein thrombosis (DVT)  of right lower extremity ([place]) 12/13/2017  . Allergic rhinitis   . Allergy    SEASONAL  . Anemia   . Anxiety    pt denies  . Asthma   . Breast cancer ([place]) 1998   in remission  . Cataract    REMOVED  . Clostridium difficile colitis 01/14/2018  . Clostridium difficile diarrhea 01/14/2018  . Complication of anesthesia    "had hard time waking up from it several times" (02/20/2012)  . Depression    "some; don't take anything for it" (02/20/2012)  . Diverticulosis   . DVT (deep venous thrombosis) ([place])   . Exertional dyspnea   . Fibromyalgia 11/2011  . GERD (gastroesophageal reflux disease)   . Graves disease   . Headache(784.0)    "related to allergies; more at different times during the year" (02/20/2012)  . Hemorrhoids   . Hiatal hernia    back and neck  . Hx of adenomatous colonic polyps 04/12/2016  . Hypercholesteremia    good cholesterol is high  . Hypothyroidism   . IBS (irritable bowel syndrome)   . Moderate persistent asthma    -FeV1 72% 2011, -IgE 102 2011, CT sinus Neg 2011  . Osteoporosis    on reclast yearly  . Pneumonia 04/2011; ~ 11/2011   "double; single" (02/20/2012)  . Seronegative rheumatoid arthritis ([place])  Dr. Lahoma Rocker  . SIRS (systemic inflammatory response syndrome) ([place]) 02/10/2018  . Tracheobronchomalacia    Past Surgical History:  Procedure Laterality Date  . ANTERIOR AND POSTERIOR REPAIR  1990's  . APPENDECTOMY    . BREAST LUMPECTOMY  1998   left  . CARPOMETACARPEL ([place]) FUSION OF THUMB WITH AUTOGRAFT FROM RADIUS  ~ 2009   "both thumbs" (02/20/2012)  . CATARACT EXTRACTION W/ INTRAOCULAR LENS  IMPLANT, BILATERAL  2012  . CERVICAL DISCECTOMY  10/2001   C5-C6  . CERVICAL FUSION  2003   C3-C4  . CHOLECYSTECTOMY    . COLONOSCOPY    . DEBRIDEMENT TENNIS ELBOW  ?1970's   right  . ESOPHAGOGASTRODUODENOSCOPY    . HYSTERECTOMY    . KNEE ARTHROPLASTY  ?1990's   "?right; w/cartilage repair" (02/20/2012)  . NASAL SEPTUM SURGERY  1980's  .  POSTERIOR CERVICAL FUSION/FORAMINOTOMY  2004   "failed initial fusion; rewired  anterior neck" (02/20/2012)  . TONSILLECTOMY  ~ 1953  . VESICOVAGINAL FISTULA CLOSURE W/ TAH  1988  . VIDEO BRONCHOSCOPY Bilateral 08/23/2016   Procedure: VIDEO BRONCHOSCOPY WITH FLUORO;  Surgeon: Javier Glazier, MD;  Location: [facility];  Service: Cardiopulmonary;  Laterality: Bilateral;   Current Outpatient Medications on File Prior to Visit  Medication Sig Dispense Refill  . acetaminophen (TYLENOL) 650 MG CR tablet Take 1,300 mg by mouth every 8 (eight) hours as needed for pain.    Marland Kitchen albuterol (PROAIR HFA) 108 (90 Base) MCG/ACT inhaler Inhale 2 puffs into the lungs every 4 (four) hours as needed for wheezing or shortness of breath. 18 g 5  . Alpha-D-Galactosidase (BEANO PO) Take 1 capsule by mouth 3 (three) times daily as needed (gas/ bloating).     . Alpha-Lipoic Acid 600 MG CAPS Take 600 mg by mouth daily.     Marland Kitchen alum & mag hydroxide-simeth (MAALOX/MYLANTA) 200-200-20 MG/5ML suspension Take 15 mLs by mouth every 4 (four) hours as needed for indigestion or heartburn. 355 mL 0  . ammonium lactate (AMLACTIN) 12 % cream Apply topically as needed for dry skin. 385 g 2  . atorvastatin (LIPITOR) 10 MG tablet TAKE 1 TABLET BY MOUTH DAILY 90 tablet 3  . azelastine (ASTELIN) 0.1 % nasal spray USE 2 SPRAYS IN EACH NOSTRIL DAILY AS DIRECTED 30 mL 5  . Bacillus Coagulans-Inulin (ALIGN PREBIOTIC-PROBIOTIC PO) Take by mouth every evening.    Marland Kitchen CALCIUM-MAGNESIUM PO Take 1 tablet by mouth 2 (two) times daily.     . cetirizine (ZYRTEC) 10 MG tablet Take 5 mg by mouth every evening.     . cholecalciferol (VITAMIN D) 25 MCG (1000 UT) tablet Take 1,000 Units by mouth daily.     . clindamycin (CLEOCIN) 300 MG capsule Take 1 capsule (300 mg total) by mouth 3 (three) times daily. 30 capsule 0  . cyproheptadine (PERIACTIN) 4 MG tablet Take 2 tablets (8 mg total) by mouth every evening. 180 tablet 2  . denosumab (PROLIA) 60 MG/ML  SOLN injection Inject 60 mg into the skin every 6 (six) months. Administer in upper arm, thigh, or abdomen    . dexlansoprazole (DEXILANT) 60 MG capsule TAKE 1 CAPSULE BY MOUTH EACH MORNING 90 capsule 3  . diclofenac Sodium (VOLTAREN) 1 % GEL APPLY 2 GRAMS TOPICALLY TWICE DAILY 350 g 3  . EPINEPHrine (EPIPEN 2-PAK) 0.3 mg/0.3 mL IJ SOAJ injection Inject 0.3 mLs (0.3 mg total) into the muscle Once PRN. 0.3 mL 2  . famotidine (PEPCID) 20 MG tablet TAKE 1 TABLET  BY MOUTH TWICE A DAY 60 tablet 1  . ferrous sulfate 325 (65 FE) MG EC tablet Take 325 mg by mouth daily with breakfast.    . fluticasone (FLONASE) 50 MCG/ACT nasal spray Place 2 sprays into both nostrils daily. 16 g 2  . furosemide (LASIX) 40 MG tablet Take 1 tablet (40 mg total) by mouth 2 (two) times daily. 60 tablet 3  . gabapentin (NEURONTIN) 600 MG tablet Take 1 tablet (600 mg total) by mouth daily. 90 tablet 3  . Golimumab ([place] ARIA IV)     . guaiFENesin (MUCINEX) 600 MG 12 hr tablet Take by mouth 2 (two) times daily.    . Lactase (LACTAID FAST ACT) 9000 units TABS Take 18,000 Units by mouth 4 (four) times daily as needed (dairy consumption).     Marland Kitchen leflunomide (ARAVA) 20 MG tablet Take 20 mg by mouth daily.    Marland Kitchen lidocaine (LIDODERM) 5 % Place 1 patch onto the skin daily as needed (pain). Remove & Discard patch within 12 hours or as directed by MD    . mirtazapine (REMERON SOL-TAB) 30 MG disintegrating tablet DISSOLVE 1 TABLET BY MOUTH EVERY NIGHT AT BEDTIME 90 tablet 1  . mometasone (NASONEX) 50 MCG/ACT nasal spray Place 2 sprays into the nose daily. 51 g 0  . mometasone-formoterol (DULERA) 200-5 MCG/ACT AERO Inhale 2 puffs into the lungs 2 (two) times daily. 39 g 1  . montelukast (SINGULAIR) 10 MG tablet TAKE 1 TABLET BY MOUTH DAILY 30 tablet 1  . mupirocin ointment (BACTROBAN) 2 % Apply 1 application topically 2 (two) times daily. 30 g 2  . nystatin (MYCOSTATIN) 100000 UNIT/ML suspension SWISH AND SPIT 5 MLS TWICE DAILY 473 mL 3   . nystatin-triamcinolone ointment (MYCOLOG) Apply topically 2 (two) times daily. 90 g 1  . ondansetron (ZOFRAN ODT) 4 MG disintegrating tablet Take 1 tablet (4 mg total) by mouth every 6 (six) hours as needed for nausea or vomiting. 40 tablet 0  . predniSONE (DELTASONE) 5 MG tablet 5 mg daily with breakfast.     . Prenatal Vit-Fe Fumarate-FA (PRENATAL MULTIVITAMIN) TABS tablet Take 1 tablet by mouth daily at 12 noon.    Marland Kitchen Propylene Glycol (SYSTANE COMPLETE) 0.6 % SOLN Apply to eye.    Marland Kitchen Respiratory Therapy Supplies (FLUTTER) DEVI Use as directed 1 each 0  . rivaroxaban (XARELTO) 20 MG TABS tablet TAKE 1 TABLET BY MOUTH DAILY WITH SUPPER 90 tablet 3  . sodium chloride HYPERTONIC 3 % nebulizer solution Take by nebulization 2 (two) times daily. 750 mL 12  . Spacer/Aero-Holding Chambers (AEROCHAMBER PLUS) inhaler Use as instructed 1 each 0  . Tiotropium Bromide Monohydrate (SPIRIVA RESPIMAT) 2.5 MCG/ACT AERS Inhale 1 puff into the lungs daily. 4 g 11   Current Facility-Administered Medications on File Prior to Visit  Medication Dose Route Frequency Provider Last Rate Last Admin  . denosumab (PROLIA) injection 60 mg  60 mg Subcutaneous Q6 months [name], MD   60 mg at 09/23/19 1108   Allergies  Allergen Reactions  . Dust Mite Extract Shortness Of Breath and Other (See Comments)    "sneezing" (02/20/2012)  . Molds & Smuts Shortness Of Breath  . Morphine Sulfate Itching  . Other Shortness Of Breath and Other (See Comments)    Grass and weeds "sneezing; filled sinuses" (02/20/2012)  . Penicillins Rash and Other (See Comments)    "welts" (02/20/2012) Has patient had a PCN reaction causing immediate rash, facial/tongue/throat swelling, SOB or lightheadedness with  hypotension: Unknown Has patient had a PCN reaction causing severe rash involving mucus membranes or skin necrosis: No Has patient had a PCN reaction that required hospitalization: No Has patient had a PCN reaction occurring within  the last 10 years: No If all of the above answers are "NO", then may proceed with Cephalosporin use.   Marland Kitchen Reclast [Zoledronic Acid] Other (See Comments)    Fever, Put in hospital, dr said it was a reaction from a reaction   . Rofecoxib Swelling    Vioxx REACTION: feet swelling  . Shrimp Flavor Anaphylaxis    ALL SHELLFISH  . Tetracycline Hcl Nausea And Vomiting  . Xolair [Omalizumab] Other (See Comments)    Caused Blood clot  . Dilaudid [Hydromorphone Hcl] Itching  . Hydrocodone-Acetaminophen Nausea And Vomiting  . Levofloxacin Other (See Comments)    REACTION: GI upset  . Oxycodone Hcl Nausea And Vomiting  . Paroxetine Nausea And Vomiting    Paxil   . Celecoxib Swelling    Feet swelling  . Diltiazem Swelling  . Lactose Intolerance (Gi) Other (See Comments)    Bloating and gas  . Tree Extract Other (See Comments)    "tested and told I was allergic to it; never experienced a reaction to it" (02/20/2012)   Social History   Socioeconomic History  . Marital status: Divorced    Spouse name: Not on file  . Number of children: 2  . Years of education: college  . Highest education level: Not on file  Occupational History  . Occupation: Disabled    Comment: Retired Engineer, production: RETIRED  Tobacco Use  . Smoking status: Passive Smoke Exposure - Never Smoker  . Smokeless tobacco: Never Used  . Tobacco comment: Parents  Vaping Use  . Vaping Use: Never used  Substance and Sexual Activity  . Alcohol use: Not Currently    Alcohol/week: 0.0 standard drinks  . Drug use: No  . Sexual activity: Never  Other Topics Concern  . Not on file  Social History Narrative   Patient lives at home alone. Patient  divorced.    Patient has her BS degree.   Right handed.   Caffeine- sometimes coffee.      [place] [facility]:   Born in [place], [place]. She worked as a Copywriter, advertising. She has no pets currently. She does have indoor plants. Previously had mold in her  home that was remediated. Carpet was removed.          Social Determinants of Health   Financial Resource Strain:   . Difficulty of Paying Living Expenses: Not on file  Food Insecurity:   . Worried About Charity fundraiser in the Last Year: Not on file  . Ran Out of Food in the Last Year: Not on file  Transportation Needs:   . Lack of Transportation (Medical): Not on file  . Lack of Transportation (Non-Medical): Not on file  Physical Activity:   . Days of Exercise per Week: Not on file  . Minutes of Exercise per Session: Not on file  Stress:   . Feeling of Stress : Not on file  Social Connections:   . Frequency of Communication with Friends and Family: Not on file  . Frequency of Social Gatherings with Friends and Family: Not on file  . Attends Religious Services: Not on file  . Active Member of Clubs or Organizations: Not on file  . Attends Archivist Meetings: Not on file  .  Marital Status: Not on file  Intimate Partner Violence:   . Fear of Current or Ex-Partner: Not on file  . Emotionally Abused: Not on file  . Physically Abused: Not on file  . Sexually Abused: Not on file     Review of Systems  All other systems reviewed and are negative.      Objective:   Physical Exam Vitals reviewed.  Constitutional:      General: She is not in acute distress.    Appearance: Normal appearance. She is well-developed. She is not ill-appearing or toxic-appearing.  HENT:     Right Ear: Tympanic membrane and ear canal normal.     Left Ear: Tympanic membrane and ear canal normal.     Nose: No congestion or rhinorrhea.  Cardiovascular:     Rate and Rhythm: Normal rate and regular rhythm.     Heart sounds: Normal heart sounds.  Pulmonary:     Effort: Pulmonary effort is normal. No respiratory distress.     Breath sounds: No stridor or decreased air movement. No wheezing or rales.  Abdominal:     General: Bowel sounds are normal. There is no distension.      Palpations: Abdomen is soft.     Tenderness: There is no abdominal tenderness. There is no guarding.  Musculoskeletal:     Right lower leg: Edema present.     Left lower leg: Tenderness and bony tenderness present. Edema present.     Left ankle: Ecchymosis present. Tenderness present over the lateral malleolus.     Left foot: Swelling, tenderness and bony tenderness present.  Skin:    Findings: No erythema.  Neurological:     Mental Status: She is alert and oriented to person, place, and time.     Cranial Nerves: No cranial nerve deficit.     Sensory: No sensory deficit.     Motor: No weakness.     Coordination: Coordination normal.   Moon facies, buffalo hump            Assessment & Plan:  Fall, initial encounter  Amnesia, global, transient  Concussion with loss of consciousness of 30 minutes or less, initial encounter  Contusion of multiple sites of left lower extremity, initial encounter  Stage 3b chronic kidney disease  Patient suffered fractures of the metatarsals in her distal forefoot.  This is being managed with a postop shoe by her podiatrist.  She appears to have sprained her left ankle although she is able to flex and extend and bear weight without any pain.  Therefore according to [facility] ankle rules, x-rays are not required at the present time.  Patient will call me back if she changes her mind and decides to get x-rays.  She also suffered a sprain and contusion to her left knee as well as a hematoma on her left posterior lateral hip.  All this should improve with time.  I believe the patient had a fall and suffered a concussion due to the fall with amnesia around the event.  We discussed a referral to neurology for an EEG to evaluate for seizure disorder.  However the patient declines that at the present time.  I believe cardiac arrhythmia is unlikely given the fact the patient remembers starting to fall and trying to reach out and grab to brace herself.  Therefore I  believe the patient most likely had a fall and concussion.  I will try to expedite her referral to a nephrologist for a second opinion regarding her  declining kidney function

## 2019-10-12 NOTE — Progress Notes (Signed)
Subjective: 69 year old female presents the office today for concerns of an ulceration on the right second toe, medial aspect.  She states that she had bleeding to the area and this is coming from the toes up and rubbing.  She does report also a fall on the left side about 3 to 4 days ago she had bruising and swelling.  She states that she did hit her head but she is not seek any medical treatment.  She currently denies any headaches or dizziness, blurry vision or any other pains. Denies any systemic complaints such as fevers, chills, nausea, vomiting. No acute changes since last appointment, and no other complaints at this time.   Objective: AAO x3, NAD DP/PT pulses palpable bilaterally, CRT less than 3 seconds On the medial aspect of right second toe on the PIPJ is a granular with slight fibrotic tissue ulceration present measuring 0.5 x 0.5 cm there is no probing to bone, undermining or tunneling.  Also no erythema there is no ascending cellulitis there is no fluctuation crepitation.  There is no malodor.  On the right foot there is no tenderness on the fourth and fifth metatarsal bases however on the left lower extremity there is ecchymosis and swelling to the leg there is tenderness strength on the fourth and fifth metatarsal base.  No significant pain at this time to the ankle.  No pain with calf compression, swelling, warmth, erythema  Assessment: 69 year old female with left fourth and fifth metatarsal fracture secondary to fall; right second toe ulceration  Plan: -All treatment options discussed with the patient including all alternatives, risks, complications.  -X-rays obtained reviewed bilaterally.  The right foot chronic fracture of the fifth and fourth metatarsals.  No obvious evidence of osteomyelitis of the second digit.  The left side acute fractures noted the fourth and fifth metatarsals with displacement to the fourth metatarsal. -Regards the left foot debrided the ulcer with any  complications none healthy, granular tissue.  Prescribed clindamycin.  Continue daily dressing changes and offloading. Monitor for any clinical signs or symptoms of infection and directed to call the office immediately should any occur or go to the ER. -On the left foot encouraged weightbearing in cam boot.  Unfortunate given other comorbidities as well as chronic steroid use this is significantly decreased her healing potential.  She already has a penicillin she can start to use this on the left foot as well.  I long discussion with her and concerned about her fall and I want her to follow-up with her primary care physician.  She states that she will contact them. -Patient encouraged to call the office with any questions, concerns, change in symptoms.   I spend 45 min with the patient coordinating care and developing a treatment plan. Over 50% of the time was in face to face interaction.   Trula Slade DPM

## 2019-10-15 DIAGNOSIS — I5032 Chronic diastolic (congestive) heart failure: Secondary | ICD-10-CM | POA: Diagnosis not present

## 2019-10-15 DIAGNOSIS — J471 Bronchiectasis with (acute) exacerbation: Secondary | ICD-10-CM | POA: Diagnosis not present

## 2019-10-15 DIAGNOSIS — G4736 Sleep related hypoventilation in conditions classified elsewhere: Secondary | ICD-10-CM | POA: Diagnosis not present

## 2019-10-15 DIAGNOSIS — M0600 Rheumatoid arthritis without rheumatoid factor, unspecified site: Secondary | ICD-10-CM | POA: Diagnosis not present

## 2019-10-15 DIAGNOSIS — J4540 Moderate persistent asthma, uncomplicated: Secondary | ICD-10-CM | POA: Diagnosis not present

## 2019-10-15 DIAGNOSIS — G894 Chronic pain syndrome: Secondary | ICD-10-CM | POA: Diagnosis not present

## 2019-10-15 DIAGNOSIS — J45909 Unspecified asthma, uncomplicated: Secondary | ICD-10-CM | POA: Diagnosis not present

## 2019-10-15 DIAGNOSIS — G4733 Obstructive sleep apnea (adult) (pediatric): Secondary | ICD-10-CM | POA: Diagnosis not present

## 2019-10-15 DIAGNOSIS — R0602 Shortness of breath: Secondary | ICD-10-CM | POA: Diagnosis not present

## 2019-10-20 ENCOUNTER — Ambulatory Visit: Payer: Medicare PPO | Admitting: Critical Care Medicine

## 2019-10-20 ENCOUNTER — Ambulatory Visit (INDEPENDENT_AMBULATORY_CARE_PROVIDER_SITE_OTHER): Payer: Medicare PPO

## 2019-10-20 ENCOUNTER — Ambulatory Visit: Payer: Medicare PPO | Admitting: Podiatry

## 2019-10-20 ENCOUNTER — Encounter: Payer: Self-pay | Admitting: Critical Care Medicine

## 2019-10-20 ENCOUNTER — Other Ambulatory Visit: Payer: Self-pay

## 2019-10-20 VITALS — BP 130/60 | HR 95 | Temp 99.6°F | Ht 61.0 in | Wt 143.0 lb

## 2019-10-20 DIAGNOSIS — M79672 Pain in left foot: Secondary | ICD-10-CM

## 2019-10-20 DIAGNOSIS — J479 Bronchiectasis, uncomplicated: Secondary | ICD-10-CM

## 2019-10-20 DIAGNOSIS — L97511 Non-pressure chronic ulcer of other part of right foot limited to breakdown of skin: Secondary | ICD-10-CM

## 2019-10-20 DIAGNOSIS — S92302D Fracture of unspecified metatarsal bone(s), left foot, subsequent encounter for fracture with routine healing: Secondary | ICD-10-CM

## 2019-10-20 DIAGNOSIS — S92302A Fracture of unspecified metatarsal bone(s), left foot, initial encounter for closed fracture: Secondary | ICD-10-CM

## 2019-10-20 DIAGNOSIS — D849 Immunodeficiency, unspecified: Secondary | ICD-10-CM | POA: Diagnosis not present

## 2019-10-20 DIAGNOSIS — M79671 Pain in right foot: Secondary | ICD-10-CM

## 2019-10-20 DIAGNOSIS — M069 Rheumatoid arthritis, unspecified: Secondary | ICD-10-CM

## 2019-10-20 DIAGNOSIS — S92919A Unspecified fracture of unspecified toe(s), initial encounter for closed fracture: Secondary | ICD-10-CM

## 2019-10-20 NOTE — Progress Notes (Signed)
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 MCG/ACT AERS Inhale 1 puff into the lungs daily. 4 g 11  . clindamycin (CLEOCIN) 300 MG capsule Take 1 capsule (300 mg total) by mouth 3 (three) times daily. 30 capsule 0  . ferrous sulfate 325 (65 FE) MG EC tablet Take 325 mg by mouth daily with breakfast.    . ondansetron (ZOFRAN ODT) 4 MG disintegrating tablet Take 1 tablet (4 mg total) by mouth every 6 (six) hours as needed for nausea or vomiting. 40 tablet 0  . predniSONE (DELTASONE) 5 MG tablet 5 mg daily with breakfast.      Facility-Administered Medications Prior to Visit  Medication Dose Route Frequency Provider Last Rate Last Admin  . denosumab (PROLIA) injection 60 mg  60 mg Subcutaneous Q6 months [name], MD   60 mg at 09/23/19 1108    Review of Systems  Constitutional: Positive for malaise/fatigue. Negative for chills, fever and weight loss.  HENT: Negative for congestion and sinus pain.   Eyes: Negative.   Respiratory: Positive for cough, sputum production and shortness of breath. Negative for hemoptysis and wheezing.   Cardiovascular: Negative for chest pain, palpitations and leg swelling.  Gastrointestinal: Negative for blood in stool, heartburn, nausea and vomiting.  Genitourinary: Negative for dysuria and  hematuria.  Musculoskeletal: Positive for joint pain. Negative for myalgias.  Neurological: Negative for focal weakness.  Endo/Heme/Allergies: Bruises/bleeds easily.  Psychiatric/Behavioral: Negative.      Objective:   Vitals:   10/20/19 1017  BP: 130/60  Pulse: 95  Temp: 99.6 F (37.6 C)  TempSrc: Temporal  SpO2: 93%  Weight: 143 lb (64.9 kg)  Height: _0  (1.549 m)   93% on  RA BMI Readings from Last 3 Encounters:  10/20/19 27.02 kg/m  10/09/19 27.78 kg/m  09/10/19 27.96 kg/m   Wt Readings from Last 3 Encounters:  10/20/19 143 lb (64.9 kg)  10/09/19 147 lb (66.7 kg)  09/10/19 148 lb (67.1 kg)    Physical Exam Vitals reviewed.  Constitutional:      General: She is not in acute distress.    Appearance: Normal appearance. She is not ill-appearing.     Comments: [place]ing appearance  HENT:     Head: Normocephalic and atraumatic.  Eyes:     General: No scleral icterus. Cardiovascular:     Rate and Rhythm: Normal rate and regular rhythm.     Heart sounds: No murmur heard.   Pulmonary:  Comments: CTAB, wet sounding cough.  Minimal conversational dyspnea at baseline. Abdominal:     General: There is no distension.     Palpations: Abdomen is soft.     Tenderness: There is no abdominal tenderness.  Musculoskeletal:     Cervical back: Neck supple.  Lymphadenopathy:     Cervical: No cervical adenopathy.  Skin:    General: Skin is warm and dry.     Findings: No rash.  Neurological:     General: No focal deficit present.     Mental Status: She is alert.     Coordination: Coordination normal.  Psychiatric:        Mood and Affect: Mood normal.        Behavior: Behavior normal.      CBC    Component Value Date/Time   WBC 10.4 09/04/2019 1441   RBC 3.36 (L) 09/04/2019 1441   HGB 11.0 (L) 09/04/2019 1441   HCT 33.9 (L) 09/04/2019 1441   PLT 247 09/04/2019 1441   MCV 100.9 (H) 09/04/2019 1441   MCH 32.7 09/04/2019 1441   MCHC 32.4 09/04/2019  1441   RDW 12.7 09/04/2019 1441   LYMPHSABS 603 (L) 09/04/2019 1441   MONOABS 0.8 07/29/2018 1014   EOSABS 10 (L) 09/04/2019 1441   BASOSABS 31 09/04/2019 1441    Allergy panel 04/02/2016-increased IgE for Aspergillus, otherwise negative Alpha-1 antitrypsin 170   BAL 12/24/2018 (performed at [facility]) Neutrophils 12% Lymphocytes 1% Macrophages 79% Respiratory viral panel negative Unable to see PJP DNA Respiratory culture-1+ mixed gram-negative rods, normal flora Fungus culture negative AFB negative  08/25/19 sputum- Pan-sensitive 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  Persistent nodules in right middle lobe and lingula with mild tree-in-bud opacities.  No inter or intralobular septal thickening or significant groundglass.  Expiratory images with some mosaicism and significant bronchial and tracheal collapse, especially on the right.   HRCT chest 08/30/19: RUL TIB opacities, multilobar bronchiectasis. Tracheobronchomalacia progressed since previous CT scan. Subacute rib fractures- healing. CAD.  Pulmonary Functions Testing Results: PFT Results Latest Ref Rng & Units 08/20/2019 09/18/2018 06/19/2017 08/08/2015 04/29/2015  FVC-Pre L 1.72 1.74 2.00 2.08 2.24  FVC-Predicted Pre % 66 66 74 75 81  FVC-Post L 1.65 1.61 1.86 2.17 2.25  FVC-Predicted Post % 63 61 69 78 81  Pre FEV1/FVC % % 90 88 90 83 87  Post FEV1/FCV % % 90 91 93 87 84  FEV1-Pre L 1.55 1.53 1.80 1.73 1.95  FEV1-Predicted Pre % 79 76 88 82 92  FEV1-Post L 1.49 1.47 1.72 1.89 1.89  DLCO uncorrected ml/min/mmHg 11.33 13.51 13.68 12.36 12.99  DLCO UNC% % 64 77 66 60 63  DLCO corrected ml/min/mmHg 11.33 - - 13.76 12.87  DLCO COR %Predicted % 64 - - 66 62  DLVA Predicted % 83 92 85 94 81  TLC L 3.71 3.52 3.95 -  3.91  TLC % Predicted % 80 76 85 - 84  RV % Predicted % 76 79 86 - 83   2020-no significant obstruction, mild restriction, mild DLCO reduction.  Flow volume loops consistent with restriction.  2021- no obstruction or bronchodilator reversibility.  No significant restriction.  Mild diffusion impairment.  Compared to 2020, mild improvement.  Previous respiratory cultures- all fungal and AFB negative from right middle lobe and lingula during bronchoscopy 08/2016, only positive for normal flora  Pathology 08/23/2016: BAL lingula-no AFB or fungus  Echocardiogram: Unable to review report from 02/2018.  08/05/2017 demonstrates LVEF 65 to 16%, grade 1 diastolic dysfunction.  Normal LA, RV, RA.  Trivial TR and MR, otherwise normal valves.  Echocardiogram 07/10/2019-LVEF greater than 75% with hyperdynamic function, moderate LVH with grade 1 diastolic dysfunction.  Normal LA, RV, RA.  Trivial AR, otherwise normal valves.     Assessment & Plan:     ICD-10-CM   1. Bronchiectasis without complication ([place])  X09.6 Respiratory or Resp and Sputum Culture  2. Rheumatoid arthritis, involving unspecified site, unspecified whether rheumatoid factor present ([place])  M06.9 Respiratory or Resp and Sputum Culture  3. Immunocompromised ([place])  D84.9 Respiratory or Resp and Sputum Culture     Bronchiectasis & tracheobronchomalacia-currently appears in place line, although she has reported progressive decline in symptoms over the past year.  No evidence of RA- ILD on CT scans or restriction on PFTs to explain her progressive symptoms. -We will repeat sputum culture today given her fears for recurrent infection.  She understands that she is at risk of recurrent infections and colonization. -Continue airway clearance therapy regimen-twice daily nebulized hypertonic saline with flutter valve.  -Continue bronchodilators -Continue CPAP with supplemental oxygen at night for tracheobronchomalacia.  She has not been using CPAP  during the day if she is having more severe shortness of breath as previously recommended. She will continue to follow up with Dr. Ander Slade in sleep medicine clinic.   -Suspect that she would benefit from pulmonary rehab to increase her exercise tolerance as her PFTs have not demonstrated a significant deterioration between 2020 and 2021.  Currently her risk of exposure to Covid may outweigh the potential benefit though.  Recommend pulmonary rehab in the future.  Continue physical activity as much as tolerated to maintain independence in exercise tolerance.  Mild persistent asthma; although PFTs have failed to demonstrate obstruction recently, her CT scan demonstrates air trapping and significant airway collapse consistent with tracheobronchomalacia. Based on her PFTs not demonstrating obstruction, she is less likely to have obliterative bronchiolitis due to RA, which typically is seen in sero-positive RA. -Continue Dulera twice daily and Spiriva once daily. Rinse after Dulera.  I am concerned about her infection risk with chronic ICS, but her diagnosis of asthma seems to predate bronchiectasis. -Up-to-date on pneumonia vaccines.  Agree with 3rd Covid vaccine.  Needs flu vaccine when available.  Continue social distancing and mask wearing. -Continue albuterol as needed.  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  No indication of uncontrolled bleeding. -Continue Xarelto indefinitely.  No bleeding complications.  Allergic rhinitis -Continue montelukast, Zyrtec, Flonase daily.  Continue azelastine twice daily.  GERD  -Continue PPI and Pepcid  Chronically immunosuppressed; no evidence of opportunistic infection on recent BAL 12/24/2018 -Ongoing follow-up with rheumatology.     RTC in 3 months.     Current Outpatient Medications:  .  acetaminophen (TYLENOL) 650 MG CR tablet, Take 1,300 mg by mouth every 8 (eight) hours as needed for pain., Disp:  , Rfl:  .  albuterol (PROAIR HFA) 108 (90 Base) MCG/ACT inhaler, Inhale 2 puffs into the lungs every 4 (four) hours as needed for wheezing or shortness of breath., Disp: 18 g, Rfl: 5 .  Alpha-D-Galactosidase (BEANO PO), Take 1 capsule by mouth 3 (three) times daily as needed (gas/ bloating). , Disp: , Rfl:  .  Alpha-Lipoic Acid 600 MG CAPS, Take 600 mg by mouth daily. ,  Disp: , Rfl:  .  alum & mag hydroxide-simeth (MAALOX/MYLANTA) 200-200-20 MG/5ML suspension, Take 15 mLs by mouth every 4 (four) hours as needed for indigestion or heartburn., Disp: 355 mL, Rfl: 0 .  ammonium lactate (AMLACTIN) 12 % cream, Apply topically as needed for dry skin., Disp: 385 g, Rfl: 2 .  atorvastatin (LIPITOR) 10 MG tablet, TAKE 1 TABLET BY MOUTH DAILY, Disp: 90 tablet, Rfl: 3 .  azelastine (ASTELIN) 0.1 % nasal spray, USE 2 SPRAYS IN EACH NOSTRIL DAILY AS DIRECTED, Disp: 30 mL, Rfl: 5 .  Bacillus Coagulans-Inulin (ALIGN PREBIOTIC-PROBIOTIC PO), Take by mouth every evening., Disp: , Rfl:  .  CALCIUM-MAGNESIUM PO, Take 1 tablet by mouth 2 (two) times daily. , Disp: , Rfl:  .  cetirizine (ZYRTEC) 10 MG tablet, Take 5 mg by mouth every evening. , Disp: , Rfl:  .  cholecalciferol (VITAMIN D) 25 MCG (1000 UT) tablet, Take 1,000 Units by mouth daily. , Disp: , Rfl:  .  cyproheptadine (PERIACTIN) 4 MG tablet, Take 2 tablets (8 mg total) by mouth every evening., Disp: 180 tablet, Rfl: 2 .  denosumab (PROLIA) 60 MG/ML SOLN injection, Inject 60 mg into the skin every 6 (six) months. Administer in upper arm, thigh, or abdomen, Disp: , Rfl:  .  dexlansoprazole (DEXILANT) 60 MG capsule, TAKE 1 CAPSULE BY MOUTH EACH MORNING, Disp: 90 capsule, Rfl: 3 .  diclofenac Sodium (VOLTAREN) 1 % GEL, APPLY 2 GRAMS TOPICALLY TWICE DAILY, Disp: 350 g, Rfl: 3 .  EPINEPHrine (EPIPEN 2-PAK) 0.3 mg/0.3 mL IJ SOAJ injection, Inject 0.3 mLs (0.3 mg total) into the muscle Once PRN., Disp: 0.3 mL, Rfl: 2 .  famotidine (PEPCID) 20 MG tablet, TAKE 1 TABLET  BY MOUTH TWICE A DAY, Disp: 60 tablet, Rfl: 1 .  fluticasone (FLONASE) 50 MCG/ACT nasal spray, Place 2 sprays into both nostrils daily., Disp: 16 g, Rfl: 2 .  furosemide (LASIX) 40 MG tablet, Take 1 tablet (40 mg total) by mouth 2 (two) times daily., Disp: 60 tablet, Rfl: 3 .  gabapentin (NEURONTIN) 600 MG tablet, Take 1 tablet (600 mg total) by mouth daily., Disp: 90 tablet, Rfl: 3 .  Golimumab (SIMPONI ARIA IV), , Disp: , Rfl:  .  guaiFENesin (MUCINEX) 600 MG 12 hr tablet, Take by mouth 2 (two) times daily., Disp: , Rfl:  .  Lactase (LACTAID FAST ACT) 9000 units TABS, Take 18,000 Units by mouth 4 (four) times daily as needed (dairy consumption). , Disp: , Rfl:  .  leflunomide (ARAVA) 20 MG tablet, Take 20 mg by mouth daily., Disp: , Rfl:  .  lidocaine (LIDODERM) 5 %, Place 1 patch onto the skin daily as needed (pain). Remove & Discard patch within 12 hours or as directed by MD, Disp: , Rfl:  .  mirtazapine (REMERON SOL-TAB) 30 MG disintegrating tablet, DISSOLVE 1 TABLET BY MOUTH EVERY NIGHT AT BEDTIME, Disp: 90 tablet, Rfl: 1 .  mometasone (NASONEX) 50 MCG/ACT nasal spray, Place 2 sprays into the nose daily., Disp: 51 g, Rfl: 0 .  mometasone-formoterol (DULERA) 200-5 MCG/ACT AERO, Inhale 2 puffs into the lungs 2 (two) times daily., Disp: 39 g, Rfl: 1 .  montelukast (SINGULAIR) 10 MG tablet, TAKE 1 TABLET BY MOUTH DAILY, Disp: 30 tablet, Rfl: 1 .  mupirocin ointment (BACTROBAN) 2 %, Apply 1 application topically 2 (two) times daily., Disp: 30 g, Rfl: 2 .  nystatin (MYCOSTATIN) 100000 UNIT/ML suspension, SWISH AND SPIT 5 MLS TWICE DAILY, Disp: 473 mL, Rfl: 3 .  nystatin-triamcinolone ointment (MYCOLOG), Apply topically 2 (two) times daily., Disp: 90 g, Rfl: 1 .  predniSONE (DELTASONE) 10 MG tablet, Take 10 mg by mouth daily with breakfast., Disp: , Rfl:  .  Prenatal Vit-Fe Fumarate-FA (PRENATAL MULTIVITAMIN) TABS tablet, Take 1 tablet by mouth daily at 12 noon., Disp: , Rfl:  .  Propylene Glycol  (SYSTANE COMPLETE) 0.6 % SOLN, Apply to eye., Disp: , Rfl:  .  Respiratory Therapy Supplies (FLUTTER) DEVI, Use as directed, Disp: 1 each, Rfl: 0 .  rivaroxaban (XARELTO) 20 MG TABS tablet, TAKE 1 TABLET BY MOUTH DAILY WITH SUPPER, Disp: 90 tablet, Rfl: 3 .  sodium chloride HYPERTONIC 3 % nebulizer solution, Take by nebulization 2 (two) times daily., Disp: 750 mL, Rfl: 12 .  Spacer/Aero-Holding Chambers (AEROCHAMBER PLUS) inhaler, Use as instructed, Disp: 1 each, Rfl: 0 .  Tiotropium Bromide Monohydrate (SPIRIVA RESPIMAT) 2.5 MCG/ACT AERS, Inhale 1 puff into the lungs daily., Disp: 4 g, Rfl: 11  Current Facility-Administered Medications:  .  denosumab (PROLIA) injection 60 mg, 60 mg, Subcutaneous, Q6 months, [name], MD, 60 mg at 09/23/19 [place] Terianna Peggs, DO [facility] 10/20/2019 10:21 AM

## 2019-10-20 NOTE — Patient Instructions (Addendum)
Thank you for visiting Dr. Carlis Abbott at [facility]. We recommend the following: Orders Placed This Encounter  Procedures  . Respiratory or Resp and Sputum Culture   Orders Placed This Encounter  Procedures  . Respiratory or Resp and Sputum Culture    Standing Status:   Future    Standing Expiration Date:   10/19/2020      Return in about 3 months (around 01/19/2020).    Please do your part to reduce the spread of COVID-19.

## 2019-10-22 ENCOUNTER — Other Ambulatory Visit: Payer: Self-pay

## 2019-10-22 ENCOUNTER — Telehealth (INDEPENDENT_AMBULATORY_CARE_PROVIDER_SITE_OTHER): Payer: Medicare PPO | Admitting: Family Medicine

## 2019-10-22 DIAGNOSIS — Z20822 Contact with and (suspected) exposure to covid-19: Secondary | ICD-10-CM

## 2019-10-22 LAB — FUNGUS CULTURE W SMEAR
CULTURE:: NO GROWTH
MICRO NUMBER:: 10786771
SMEAR:: NONE SEEN
SPECIMEN QUALITY:: ADEQUATE

## 2019-10-22 LAB — RESPIRATORY CULTURE OR RESPIRATORY AND SPUTUM CULTURE
MICRO NUMBER:: 10786772
RESULT:: NORMAL
SPECIMEN QUALITY:: ADEQUATE

## 2019-10-22 MED ORDER — TRAMADOL HCL 50 MG PO TABS: 50.0000 mg | ORAL_TABLET | Freq: Three times a day (TID) | ORAL | 0 refills | Status: AC | PRN

## 2019-10-22 NOTE — Progress Notes (Signed)
Subjective:    Patient ID: Ebony Scott, female    DOB: 28-Nov-1950, 69 y.o.   MRN: [id number]  HPI  Patient is being seen today as a telephone visit.  Phone call began at 351.  Phone call concluded at 405.  Patient consents to be seen via telephone.  She is at home.  I am in my office.  Patient's daughter is a Marine scientist.  She stayed with her mother Monday night.  The daughter tested positive for Covid on Tuesday.  The patient is now concerned because she has been exposed to Covid and she has a very complicated past medical history.  She denies any fevers or chills.  She denies any change in her sense of smell or taste.  She has a chronic cough but this has not changed.  She denies any rhinorrhea.  She denies any high fever or body aches beyond normal. Past Medical History:  Diagnosis Date  . Acute deep vein thrombosis (DVT) of right lower extremity ([place]) 12/13/2017  . Allergic rhinitis   . Allergy    SEASONAL  . Anemia   . Anxiety    pt denies  . Arthritis    Phreesia 10/21/2019  . Asthma   . Asthma    Phreesia 10/21/2019  . Breast cancer ([place]) 1998   in remission  . Cataract    REMOVED  . Clostridium difficile colitis 01/14/2018  . Clostridium difficile diarrhea 01/14/2018  . Complication of anesthesia    "had hard time waking up from it several times" (02/20/2012)  . Depression    "some; don't take anything for it" (02/20/2012)  . Diverticulosis   . DVT (deep venous thrombosis) ([place])   . Exertional dyspnea   . Fibromyalgia 11/2011  . GERD (gastroesophageal reflux disease)   . Graves disease   . Headache(784.0)    "related to allergies; more at different times during the year" (02/20/2012)  . Hemorrhoids   . Hiatal hernia    back and neck  . Hx of adenomatous colonic polyps 04/12/2016  . Hypercholesteremia    good cholesterol is high  . Hypothyroidism   . IBS (irritable bowel syndrome)   . Moderate persistent asthma    -FeV1 72% 2011, -IgE 102 2011, CT sinus Neg 2011  .  Osteoporosis    on reclast yearly  . Pneumonia 04/2011; ~ 11/2011   "double; single" (02/20/2012)  . Seronegative rheumatoid arthritis ([place])    Dr. Lahoma Rocker  . SIRS (systemic inflammatory response syndrome) ([place]) 02/10/2018  . Tracheobronchomalacia    Past Surgical History:  Procedure Laterality Date  . ABDOMINAL HYSTERECTOMY N/A    Phreesia 10/21/2019  . ANTERIOR AND POSTERIOR REPAIR  1990's  . APPENDECTOMY    . BREAST LUMPECTOMY  1998   left  . BREAST SURGERY N/A    Phreesia 10/21/2019  . CARPOMETACARPEL ([place]) FUSION OF THUMB WITH AUTOGRAFT FROM RADIUS  ~ 2009   "both thumbs" (02/20/2012)  . CATARACT EXTRACTION W/ INTRAOCULAR LENS  IMPLANT, BILATERAL  2012  . CERVICAL DISCECTOMY  10/2001   C5-C6  . CERVICAL FUSION  2003   C3-C4  . CHOLECYSTECTOMY    . COLONOSCOPY    . DEBRIDEMENT TENNIS ELBOW  ?1970's   right  . ESOPHAGOGASTRODUODENOSCOPY    . EYE SURGERY N/A    Phreesia 10/21/2019  . HYSTERECTOMY    . KNEE ARTHROPLASTY  ?1990's   "?right; w/cartilage repair" (02/20/2012)  . NASAL SEPTUM SURGERY  1980's  . POSTERIOR CERVICAL FUSION/FORAMINOTOMY  2004   "failed initial fusion; rewired  anterior neck" (02/20/2012)  . SPINE SURGERY N/A    Phreesia 10/21/2019  . TONSILLECTOMY  ~ 1953  . VESICOVAGINAL FISTULA CLOSURE W/ TAH  1988  . VIDEO BRONCHOSCOPY Bilateral 08/23/2016   Procedure: VIDEO BRONCHOSCOPY WITH FLUORO;  Surgeon: Javier Glazier, MD;  Location: [facility];  Service: Cardiopulmonary;  Laterality: Bilateral;   Current Outpatient Medications on File Prior to Visit  Medication Sig Dispense Refill  . acetaminophen (TYLENOL) 650 MG CR tablet Take 1,300 mg by mouth every 8 (eight) hours as needed for pain.    Marland Kitchen albuterol (PROAIR HFA) 108 (90 Base) MCG/ACT inhaler Inhale 2 puffs into the lungs every 4 (four) hours as needed for wheezing or shortness of breath. 18 g 5  . Alpha-D-Galactosidase (BEANO PO) Take 1 capsule by mouth 3 (three) times daily as needed (gas/  bloating).     . Alpha-Lipoic Acid 600 MG CAPS Take 600 mg by mouth daily.     Marland Kitchen alum & mag hydroxide-simeth (MAALOX/MYLANTA) 200-200-20 MG/5ML suspension Take 15 mLs by mouth every 4 (four) hours as needed for indigestion or heartburn. 355 mL 0  . ammonium lactate (AMLACTIN) 12 % cream Apply topically as needed for dry skin. 385 g 2  . atorvastatin (LIPITOR) 10 MG tablet TAKE 1 TABLET BY MOUTH DAILY 90 tablet 3  . azelastine (ASTELIN) 0.1 % nasal spray USE 2 SPRAYS IN EACH NOSTRIL DAILY AS DIRECTED 30 mL 5  . Bacillus Coagulans-Inulin (ALIGN PREBIOTIC-PROBIOTIC PO) Take by mouth every evening.    Marland Kitchen CALCIUM-MAGNESIUM PO Take 1 tablet by mouth 2 (two) times daily.     . cetirizine (ZYRTEC) 10 MG tablet Take 5 mg by mouth every evening.     . cholecalciferol (VITAMIN D) 25 MCG (1000 UT) tablet Take 1,000 Units by mouth daily.     . cyproheptadine (PERIACTIN) 4 MG tablet Take 2 tablets (8 mg total) by mouth every evening. 180 tablet 2  . denosumab (PROLIA) 60 MG/ML SOLN injection Inject 60 mg into the skin every 6 (six) months. Administer in upper arm, thigh, or abdomen    . dexlansoprazole (DEXILANT) 60 MG capsule TAKE 1 CAPSULE BY MOUTH EACH MORNING 90 capsule 3  . diclofenac Sodium (VOLTAREN) 1 % GEL APPLY 2 GRAMS TOPICALLY TWICE DAILY 350 g 3  . EPINEPHrine (EPIPEN 2-PAK) 0.3 mg/0.3 mL IJ SOAJ injection Inject 0.3 mLs (0.3 mg total) into the muscle Once PRN. 0.3 mL 2  . famotidine (PEPCID) 20 MG tablet TAKE 1 TABLET BY MOUTH TWICE A DAY 60 tablet 1  . fluticasone (FLONASE) 50 MCG/ACT nasal spray Place 2 sprays into both nostrils daily. 16 g 2  . furosemide (LASIX) 40 MG tablet Take 1 tablet (40 mg total) by mouth 2 (two) times daily. 60 tablet 3  . gabapentin (NEURONTIN) 600 MG tablet Take 1 tablet (600 mg total) by mouth daily. 90 tablet 3  . guaiFENesin (MUCINEX) 600 MG 12 hr tablet Take by mouth 2 (two) times daily.    . Lactase (LACTAID FAST ACT) 9000 units TABS Take 18,000 Units by mouth 4  (four) times daily as needed (dairy consumption).     Marland Kitchen leflunomide (ARAVA) 20 MG tablet Take 20 mg by mouth daily.    Marland Kitchen lidocaine (LIDODERM) 5 % Place 1 patch onto the skin daily as needed (pain). Remove & Discard patch within 12 hours or as directed by MD    . mirtazapine (REMERON SOL-TAB) 30 MG disintegrating  tablet DISSOLVE 1 TABLET BY MOUTH EVERY NIGHT AT BEDTIME 90 tablet 1  . mometasone (NASONEX) 50 MCG/ACT nasal spray Place 2 sprays into the nose daily. 51 g 0  . mometasone-formoterol (DULERA) 200-5 MCG/ACT AERO Inhale 2 puffs into the lungs 2 (two) times daily. 39 g 1  . montelukast (SINGULAIR) 10 MG tablet TAKE 1 TABLET BY MOUTH DAILY 30 tablet 1  . mupirocin ointment (BACTROBAN) 2 % Apply 1 application topically 2 (two) times daily. 30 g 2  . nystatin (MYCOSTATIN) 100000 UNIT/ML suspension SWISH AND SPIT 5 MLS TWICE DAILY 473 mL 3  . nystatin-triamcinolone ointment (MYCOLOG) Apply topically 2 (two) times daily. 90 g 1  . predniSONE (DELTASONE) 10 MG tablet Take 10 mg by mouth daily with breakfast.    . Prenatal Vit-Fe Fumarate-FA (PRENATAL MULTIVITAMIN) TABS tablet Take 1 tablet by mouth daily at 12 noon.    Marland Kitchen Propylene Glycol (SYSTANE COMPLETE) 0.6 % SOLN Apply to eye.    Marland Kitchen Respiratory Therapy Supplies (FLUTTER) DEVI Use as directed 1 each 0  . rivaroxaban (XARELTO) 20 MG TABS tablet TAKE 1 TABLET BY MOUTH DAILY WITH SUPPER 90 tablet 3  . sodium chloride HYPERTONIC 3 % nebulizer solution Take by nebulization 2 (two) times daily. 750 mL 12  . Spacer/Aero-Holding Chambers (AEROCHAMBER PLUS) inhaler Use as instructed 1 each 0  . Tiotropium Bromide Monohydrate (SPIRIVA RESPIMAT) 2.5 MCG/ACT AERS Inhale 1 puff into the lungs daily. 4 g 11   Current Facility-Administered Medications on File Prior to Visit  Medication Dose Route Frequency Provider Last Rate Last Admin  . denosumab (PROLIA) injection 60 mg  60 mg Subcutaneous Q6 months [name], MD   60 mg at 09/23/19 1108    Allergies  Allergen Reactions  . Dust Mite Extract Shortness Of Breath and Other (See Comments)    "sneezing" (02/20/2012)  . Molds & Smuts Shortness Of Breath  . Morphine And Related Hives and Itching  . Morphine Sulfate Itching  . Other Shortness Of Breath and Other (See Comments)    Grass and weeds "sneezing; filled sinuses" (02/20/2012)  . Penicillins Rash and Other (See Comments)    "welts" (02/20/2012) Has patient had a PCN reaction causing immediate rash, facial/tongue/throat swelling, SOB or lightheadedness with hypotension: Unknown Has patient had a PCN reaction causing severe rash involving mucus membranes or skin necrosis: No Has patient had a PCN reaction that required hospitalization: No Has patient had a PCN reaction occurring within the last 10 years: No If all of the above answers are "NO", then may proceed with Cephalosporin use.   Marland Kitchen Reclast [Zoledronic Acid] Other (See Comments)    Fever, Put in hospital, dr said it was a reaction from a reaction   . Rofecoxib Swelling    Vioxx REACTION: feet swelling  . Shellfish Allergy Itching, Rash, Shortness Of Breath and Swelling  . Shrimp Flavor Anaphylaxis    ALL SHELLFISH  . Tetracycline Hcl Nausea And Vomiting  . Xolair [Omalizumab] Other (See Comments)    Caused Blood clot  . Dilaudid [Hydromorphone Hcl] Itching  . Hydrocodone-Acetaminophen Nausea And Vomiting  . Levofloxacin Other (See Comments)    REACTION: GI upset  . Oxycodone Hcl Nausea And Vomiting  . Paroxetine Nausea And Vomiting    Paxil   . Celecoxib Swelling    Feet swelling  . Diltiazem Swelling  . Lactose Intolerance (Gi) Other (See Comments)    Bloating and gas  . Tree Extract Other (See Comments)    "  tested and told I was allergic to it; never experienced a reaction to it" (02/20/2012)   Social History   Socioeconomic History  . Marital status: Divorced    Spouse name: Not on file  . Number of children: 2  . Years of education: college  .  Highest education level: Not on file  Occupational History  . Occupation: Disabled    Comment: Retired Engineer, production: RETIRED  Tobacco Use  . Smoking status: Passive Smoke Exposure - Never Smoker  . Smokeless tobacco: Never Used  . Tobacco comment: Parents  Vaping Use  . Vaping Use: Never used  Substance and Sexual Activity  . Alcohol use: Not Currently    Alcohol/week: 0.0 standard drinks  . Drug use: No  . Sexual activity: Never  Other Topics Concern  . Not on file  Social History Narrative   Patient lives at home alone. Patient  divorced.    Patient has her BS degree.   Right handed.   Caffeine- sometimes coffee.      [facility]:   Born in [place], [place]. She worked as a Copywriter, advertising. She has no pets currently. She does have indoor plants. Previously had mold in her home that was remediated. Carpet was removed.          Social Determinants of Health   Financial Resource Strain:   . Difficulty of Paying Living Expenses: Not on file  Food Insecurity:   . Worried About Charity fundraiser in the Last Year: Not on file  . Ran Out of Food in the Last Year: Not on file  Transportation Needs:   . Lack of Transportation (Medical): Not on file  . Lack of Transportation (Non-Medical): Not on file  Physical Activity:   . Days of Exercise per Week: Not on file  . Minutes of Exercise per Session: Not on file  Stress:   . Feeling of Stress : Not on file  Social Connections:   . Frequency of Communication with Friends and Family: Not on file  . Frequency of Social Gatherings with Friends and Family: Not on file  . Attends Religious Services: Not on file  . Active Member of Clubs or Organizations: Not on file  . Attends Archivist Meetings: Not on file  . Marital Status: Not on file  Intimate Partner Violence:   . Fear of Current or Ex-Partner: Not on file  . Emotionally Abused: Not on file  . Physically Abused: Not on file  .  Sexually Abused: Not on file     Review of Systems  All other systems reviewed and are negative.      Objective:   Physical Exam        Assessment & Plan:  Close exposure to COVID-19 virus  Patient has had both doses of the Covid vaccine.  However she is concerned because her daughter had as well and now she is sick.  Patient can come by my office today or tomorrow and I will be glad to swab her and screen for COVID-19.  I recommended quarantine until the test results return next week.  Patient is asymptomatic at the present time however if she test positive I would recommend Regeneron

## 2019-10-23 ENCOUNTER — Other Ambulatory Visit: Payer: Self-pay | Admitting: Family Medicine

## 2019-10-23 DIAGNOSIS — Z20822 Contact with and (suspected) exposure to covid-19: Secondary | ICD-10-CM | POA: Diagnosis not present

## 2019-10-24 LAB — SARS-COV-2 RNA,(COVID-19) QUALITATIVE NAAT: SARS CoV2 RNA: NOT DETECTED

## 2019-10-28 DIAGNOSIS — J471 Bronchiectasis with (acute) exacerbation: Secondary | ICD-10-CM | POA: Diagnosis not present

## 2019-10-29 NOTE — Progress Notes (Signed)
Subjective: 69 year old female presents the office today for follow-up evaluation of a wound on the right second toe medial aspect as well as her bilateral foot fractures.  She states the right second toe wound still evident but she denies any drainage or pus and no increase in swelling or redness of the toe.  Some discomfort in both feet but she is wearing regular shoe.  She did follow-up with her primary care physician due to the fall. Denies any systemic complaints such as fevers, chills, nausea, vomiting. No acute changes since last appointment, and no other complaints at this time.   Objective: AAO x3, NAD-presents today wearing regular shoe. DP/PT pulses palpable bilaterally, CRT less than 3 seconds On the medial aspect of right second toe on the PIPJ is a granular with slight fibrotic tissue ulceration present measuring 0.5 x 0.5 cm there is no probing to bone, undermining or tunneling.  Hyperkeratotic periwound.  There is no surrounding erythema, ascending cellulitis there is no fluctuation crepitation there is no malodor.  There is no edema to the toe.  Wound is doing about the same compared to last appointment. There is still tenderness to palpation although improved on the left foot fourth and fifth metatarsals.  No significant pain to the right foot.  Flexor, extensor tendons appear to be intact. No pain with calf compression, swelling, warmth, erythema  Assessment: 69 year old female with left fourth and fifth metatarsal fracture secondary to fall; right second toe ulceration  Plan: -All treatment options discussed with the patient including all alternatives, risks, complications.  -X-rays obtained reviewed.  Mild displaced fracture of the fourth metatarsal left foot as well as fracture of the left fifth metatarsal noted with callus formation starting to form.  On the right foot fractures appear to be unchanged.  There is no evidence of osteomyelitis or soft tissue present of the second  toe. -Regards to the wound I debrided this slightly to remove some hyperkeratotic tissue from the periwound and debrided the wound down to healthy, granular tissue utilizing the 312 with scalpel.  We will switch to Iodosorb dressing changes daily which I dispensed today.  Continue offloading at all times. -Reviewed x-rays with her and reinforced immobilization.  Continue bone stimulator.   -Unfortunately given multiple comorbidities she is at high risk of nonhealing of the wound and the fractures.  Her steroid dose has also been increased.  Trula Slade DPM

## 2019-10-30 DIAGNOSIS — G4733 Obstructive sleep apnea (adult) (pediatric): Secondary | ICD-10-CM | POA: Diagnosis not present

## 2019-11-04 ENCOUNTER — Other Ambulatory Visit: Payer: Self-pay | Admitting: Family Medicine

## 2019-11-09 DIAGNOSIS — Z7722 Contact with and (suspected) exposure to environmental tobacco smoke (acute) (chronic): Secondary | ICD-10-CM | POA: Diagnosis not present

## 2019-11-09 DIAGNOSIS — Z809 Family history of malignant neoplasm, unspecified: Secondary | ICD-10-CM | POA: Diagnosis not present

## 2019-11-09 DIAGNOSIS — R609 Edema, unspecified: Secondary | ICD-10-CM | POA: Diagnosis not present

## 2019-11-09 DIAGNOSIS — J45909 Unspecified asthma, uncomplicated: Secondary | ICD-10-CM | POA: Diagnosis not present

## 2019-11-09 DIAGNOSIS — Z8249 Family history of ischemic heart disease and other diseases of the circulatory system: Secondary | ICD-10-CM | POA: Diagnosis not present

## 2019-11-09 DIAGNOSIS — E785 Hyperlipidemia, unspecified: Secondary | ICD-10-CM | POA: Diagnosis not present

## 2019-11-09 DIAGNOSIS — Z86718 Personal history of other venous thrombosis and embolism: Secondary | ICD-10-CM | POA: Diagnosis not present

## 2019-11-09 DIAGNOSIS — M0550 Rheumatoid polyneuropathy with rheumatoid arthritis of unspecified site: Secondary | ICD-10-CM | POA: Diagnosis not present

## 2019-11-09 DIAGNOSIS — Z87892 Personal history of anaphylaxis: Secondary | ICD-10-CM | POA: Diagnosis not present

## 2019-11-09 DIAGNOSIS — R030 Elevated blood-pressure reading, without diagnosis of hypertension: Secondary | ICD-10-CM | POA: Diagnosis not present

## 2019-11-09 DIAGNOSIS — Z853 Personal history of malignant neoplasm of breast: Secondary | ICD-10-CM | POA: Diagnosis not present

## 2019-11-09 DIAGNOSIS — K219 Gastro-esophageal reflux disease without esophagitis: Secondary | ICD-10-CM | POA: Diagnosis not present

## 2019-11-09 DIAGNOSIS — Z880 Allergy status to penicillin: Secondary | ICD-10-CM | POA: Diagnosis not present

## 2019-11-09 DIAGNOSIS — Z86711 Personal history of pulmonary embolism: Secondary | ICD-10-CM | POA: Diagnosis not present

## 2019-11-09 DIAGNOSIS — Z823 Family history of stroke: Secondary | ICD-10-CM | POA: Diagnosis not present

## 2019-11-09 DIAGNOSIS — M810 Age-related osteoporosis without current pathological fracture: Secondary | ICD-10-CM | POA: Diagnosis not present

## 2019-11-09 DIAGNOSIS — Z7901 Long term (current) use of anticoagulants: Secondary | ICD-10-CM | POA: Diagnosis not present

## 2019-11-09 DIAGNOSIS — Z833 Family history of diabetes mellitus: Secondary | ICD-10-CM | POA: Diagnosis not present

## 2019-11-12 ENCOUNTER — Other Ambulatory Visit: Payer: Medicare PPO

## 2019-11-12 ENCOUNTER — Ambulatory Visit: Payer: Medicare PPO | Admitting: Podiatry

## 2019-11-12 ENCOUNTER — Other Ambulatory Visit: Payer: Self-pay

## 2019-11-12 DIAGNOSIS — D849 Immunodeficiency, unspecified: Secondary | ICD-10-CM | POA: Diagnosis not present

## 2019-11-12 DIAGNOSIS — J479 Bronchiectasis, uncomplicated: Secondary | ICD-10-CM | POA: Diagnosis not present

## 2019-11-12 DIAGNOSIS — S92302D Fracture of unspecified metatarsal bone(s), left foot, subsequent encounter for fracture with routine healing: Secondary | ICD-10-CM | POA: Diagnosis not present

## 2019-11-12 DIAGNOSIS — M069 Rheumatoid arthritis, unspecified: Secondary | ICD-10-CM

## 2019-11-12 DIAGNOSIS — L97511 Non-pressure chronic ulcer of other part of right foot limited to breakdown of skin: Secondary | ICD-10-CM

## 2019-11-15 DIAGNOSIS — G894 Chronic pain syndrome: Secondary | ICD-10-CM | POA: Diagnosis not present

## 2019-11-15 DIAGNOSIS — J4540 Moderate persistent asthma, uncomplicated: Secondary | ICD-10-CM | POA: Diagnosis not present

## 2019-11-15 DIAGNOSIS — I5032 Chronic diastolic (congestive) heart failure: Secondary | ICD-10-CM | POA: Diagnosis not present

## 2019-11-15 DIAGNOSIS — J471 Bronchiectasis with (acute) exacerbation: Secondary | ICD-10-CM | POA: Diagnosis not present

## 2019-11-15 DIAGNOSIS — R0602 Shortness of breath: Secondary | ICD-10-CM | POA: Diagnosis not present

## 2019-11-15 DIAGNOSIS — G4736 Sleep related hypoventilation in conditions classified elsewhere: Secondary | ICD-10-CM | POA: Diagnosis not present

## 2019-11-15 DIAGNOSIS — G4733 Obstructive sleep apnea (adult) (pediatric): Secondary | ICD-10-CM | POA: Diagnosis not present

## 2019-11-15 DIAGNOSIS — M0600 Rheumatoid arthritis without rheumatoid factor, unspecified site: Secondary | ICD-10-CM | POA: Diagnosis not present

## 2019-11-15 DIAGNOSIS — J45909 Unspecified asthma, uncomplicated: Secondary | ICD-10-CM | POA: Diagnosis not present

## 2019-11-15 LAB — RESPIRATORY CULTURE OR RESPIRATORY AND SPUTUM CULTURE
MICRO NUMBER:: 10987499
SPECIMEN QUALITY:: ADEQUATE

## 2019-11-15 NOTE — Progress Notes (Signed)
This seems to be persistent, which worries me that this may be more pathogenic than originally thought. It is still not pathogenic to other people, so no need to worry about spreading this. We are limited in which antibiotics can be used to treat this. I need an updated BMP to see her kidney function to know if an oral antibiotic is an option (since her kidney function has been worsening over the past few months). Can you have her please come get a BMP drawn and if an oral antibiotic is an option, I will prescribe it once I get the results. If an oral antibiotic is not an option, the only options are IV medications, which is much more complicated. Thanks! LPC

## 2019-11-16 DIAGNOSIS — N1832 Chronic kidney disease, stage 3b: Secondary | ICD-10-CM | POA: Diagnosis not present

## 2019-11-16 DIAGNOSIS — M109 Gout, unspecified: Secondary | ICD-10-CM | POA: Diagnosis not present

## 2019-11-16 DIAGNOSIS — N189 Chronic kidney disease, unspecified: Secondary | ICD-10-CM | POA: Diagnosis not present

## 2019-11-16 NOTE — Progress Notes (Addendum)
Subjective: 69 year old female presents the office today for follow-up evaluation of wound on the right second toe.  She states that she will occasionally get sharp pain to this area into the toe.  She denies any increase in swelling or redness no drainage or pus.  In general her feet have been feeling better with the left side better than the right.  She still using the bone stimulator. Denies any systemic complaints such as fevers, chills, nausea, vomiting. No acute changes since last appointment, and no other complaints at this time.   Objective: AAO x3, NAD DP/PT pulses palpable bilaterally, CRT less than 3 seconds Ulceration of the medial right second toe on the PIPJ although granular wound base is present.  There is no probing to bone, around tunneling.  It still measures 0.5 x 0.5 cm but is more superficial.  There is no surrounding erythema, ascending cellulitis.  Mild hyperkeratotic periwound.  No fluctuance or crepitation peer there is no malodor.  Mild discomfort distal to the fourth and fifth metatarsal left foot although improved.  No significant pain in the right foot.  No pain with calf compression, swelling, warmth, erythema  Assessment: Ulceration right second toe; metatarsal fractures  Plan: -All treatment options discussed with the patient including all alternatives, risks, complications.  -I debrided the wound to the right second toe without any complications or bleeding utilizing #312 with scalpel.  This was debrided to healthy, viable tissue.  We will continue with Iodosorb dressing changes daily.  Continue offloading.  She has not been using the toe separator and encouraged to do the toe as well. -Continue mobilization for the metatarsal fractures although she is wearing a regular shoe today she wears a boot otherwise.  Next appointment will repeat x-rays.  I will get x-rays bilaterally. -Patient encouraged to call the office with any questions, concerns, change in symptoms.    Trula Slade DPM

## 2019-11-18 ENCOUNTER — Other Ambulatory Visit: Payer: Self-pay

## 2019-11-18 DIAGNOSIS — J479 Bronchiectasis, uncomplicated: Secondary | ICD-10-CM

## 2019-11-20 ENCOUNTER — Other Ambulatory Visit (INDEPENDENT_AMBULATORY_CARE_PROVIDER_SITE_OTHER): Payer: Medicare PPO

## 2019-11-20 DIAGNOSIS — Z79899 Other long term (current) drug therapy: Secondary | ICD-10-CM | POA: Diagnosis not present

## 2019-11-20 DIAGNOSIS — M7989 Other specified soft tissue disorders: Secondary | ICD-10-CM | POA: Diagnosis not present

## 2019-11-20 DIAGNOSIS — Z7952 Long term (current) use of systemic steroids: Secondary | ICD-10-CM | POA: Diagnosis not present

## 2019-11-20 DIAGNOSIS — M79643 Pain in unspecified hand: Secondary | ICD-10-CM | POA: Diagnosis not present

## 2019-11-20 DIAGNOSIS — N1831 Chronic kidney disease, stage 3a: Secondary | ICD-10-CM | POA: Diagnosis not present

## 2019-11-20 DIAGNOSIS — M0609 Rheumatoid arthritis without rheumatoid factor, multiple sites: Secondary | ICD-10-CM | POA: Diagnosis not present

## 2019-11-20 DIAGNOSIS — M810 Age-related osteoporosis without current pathological fracture: Secondary | ICD-10-CM | POA: Diagnosis not present

## 2019-11-20 DIAGNOSIS — J479 Bronchiectasis, uncomplicated: Secondary | ICD-10-CM | POA: Diagnosis not present

## 2019-11-20 DIAGNOSIS — R748 Abnormal levels of other serum enzymes: Secondary | ICD-10-CM | POA: Diagnosis not present

## 2019-11-20 DIAGNOSIS — Z23 Encounter for immunization: Secondary | ICD-10-CM | POA: Diagnosis not present

## 2019-11-20 LAB — BASIC METABOLIC PANEL
BUN: 36 mg/dL — ABNORMAL HIGH (ref 6–23)
CO2: 26 mEq/L (ref 19–32)
Calcium: 9.1 mg/dL (ref 8.4–10.5)
Chloride: 100 mEq/L (ref 96–112)
Creatinine, Ser: 1.62 mg/dL — ABNORMAL HIGH (ref 0.40–1.20)
GFR: 31.26 mL/min — ABNORMAL LOW (ref >60.00)
Glucose, Bld: 129 mg/dL — ABNORMAL HIGH (ref 70–99)
Potassium: 3.9 mEq/L (ref 3.5–5.1)
Sodium: 137 mEq/L (ref 135–145)

## 2019-11-25 ENCOUNTER — Telehealth: Payer: Self-pay | Admitting: Critical Care Medicine

## 2019-11-25 DIAGNOSIS — N289 Disorder of kidney and ureter, unspecified: Secondary | ICD-10-CM

## 2019-11-25 MED ORDER — SULFAMETHOXAZOLE-TRIMETHOPRIM 800-160 MG PO TABS: 1.0000 | ORAL_TABLET | Freq: Two times a day (BID) | ORAL | 0 refills | Status: DC

## 2019-11-25 NOTE — Telephone Encounter (Signed)
Called and spoke with patient, she is calling to see what Dr. Carlis Abbott decided about an antibiotic to treat what is growing in her sputum.  I advised I would send this message to Dr. Carlis Abbott and when we hear back from Dr. Carlis Abbott we will give her a return call.  She was appreciative.  Dr. Carlis Abbott, Does patient need to be on an antibiotic based on the sputum culture from 11/15/19.  Please advise.  Thank you.

## 2019-11-25 NOTE — Telephone Encounter (Signed)
Thanks for checking in. I talked to pharmacy regarding antibiotic options with her kidney function. The best option is Bactrim 1 tablet two times daily x 14 days. In 7 days  (mid-way through her course) we need to recheck a BMP to monitor her kidney function and make sure she can stay on that medication and dose. With Bactrim, if she gets a rash she should stop. Thanks!  Julian Hy, DO 11/25/19 3:56 PM [facility]

## 2019-11-25 NOTE — Telephone Encounter (Signed)
Called and provided instructions per Dr. Carlis Abbott Advised regarding the need for lab work 7 days after starting the antibiotic and to stop the antibiotic if she develops a rash.  She verbalized understanding.  Nothing further needed.

## 2019-11-26 ENCOUNTER — Ambulatory Visit: Payer: Medicare PPO | Admitting: Podiatry

## 2019-11-26 ENCOUNTER — Encounter: Payer: Self-pay | Admitting: Family Medicine

## 2019-11-26 ENCOUNTER — Other Ambulatory Visit: Payer: Self-pay

## 2019-11-26 ENCOUNTER — Ambulatory Visit (INDEPENDENT_AMBULATORY_CARE_PROVIDER_SITE_OTHER): Payer: Medicare PPO

## 2019-11-26 DIAGNOSIS — S92301K Fracture of unspecified metatarsal bone(s), right foot, subsequent encounter for fracture with nonunion: Secondary | ICD-10-CM | POA: Diagnosis not present

## 2019-11-26 DIAGNOSIS — S92302D Fracture of unspecified metatarsal bone(s), left foot, subsequent encounter for fracture with routine healing: Secondary | ICD-10-CM

## 2019-11-26 DIAGNOSIS — L97511 Non-pressure chronic ulcer of other part of right foot limited to breakdown of skin: Secondary | ICD-10-CM

## 2019-11-27 DIAGNOSIS — J471 Bronchiectasis with (acute) exacerbation: Secondary | ICD-10-CM | POA: Diagnosis not present

## 2019-11-29 DIAGNOSIS — G4733 Obstructive sleep apnea (adult) (pediatric): Secondary | ICD-10-CM | POA: Diagnosis not present

## 2019-11-30 ENCOUNTER — Other Ambulatory Visit: Payer: Self-pay | Admitting: Nephrology

## 2019-11-30 DIAGNOSIS — N1832 Chronic kidney disease, stage 3b: Secondary | ICD-10-CM

## 2019-11-30 IMAGING — DX DG CHEST 2V
2 series · 2 of 2 positions shown · non-contrast
Comparison: 08/30/2016

CLINICAL DATA: Increased shortness of breath for several days,
initial encounter

EXAM:
CHEST - 2 VIEW

[chest pa]
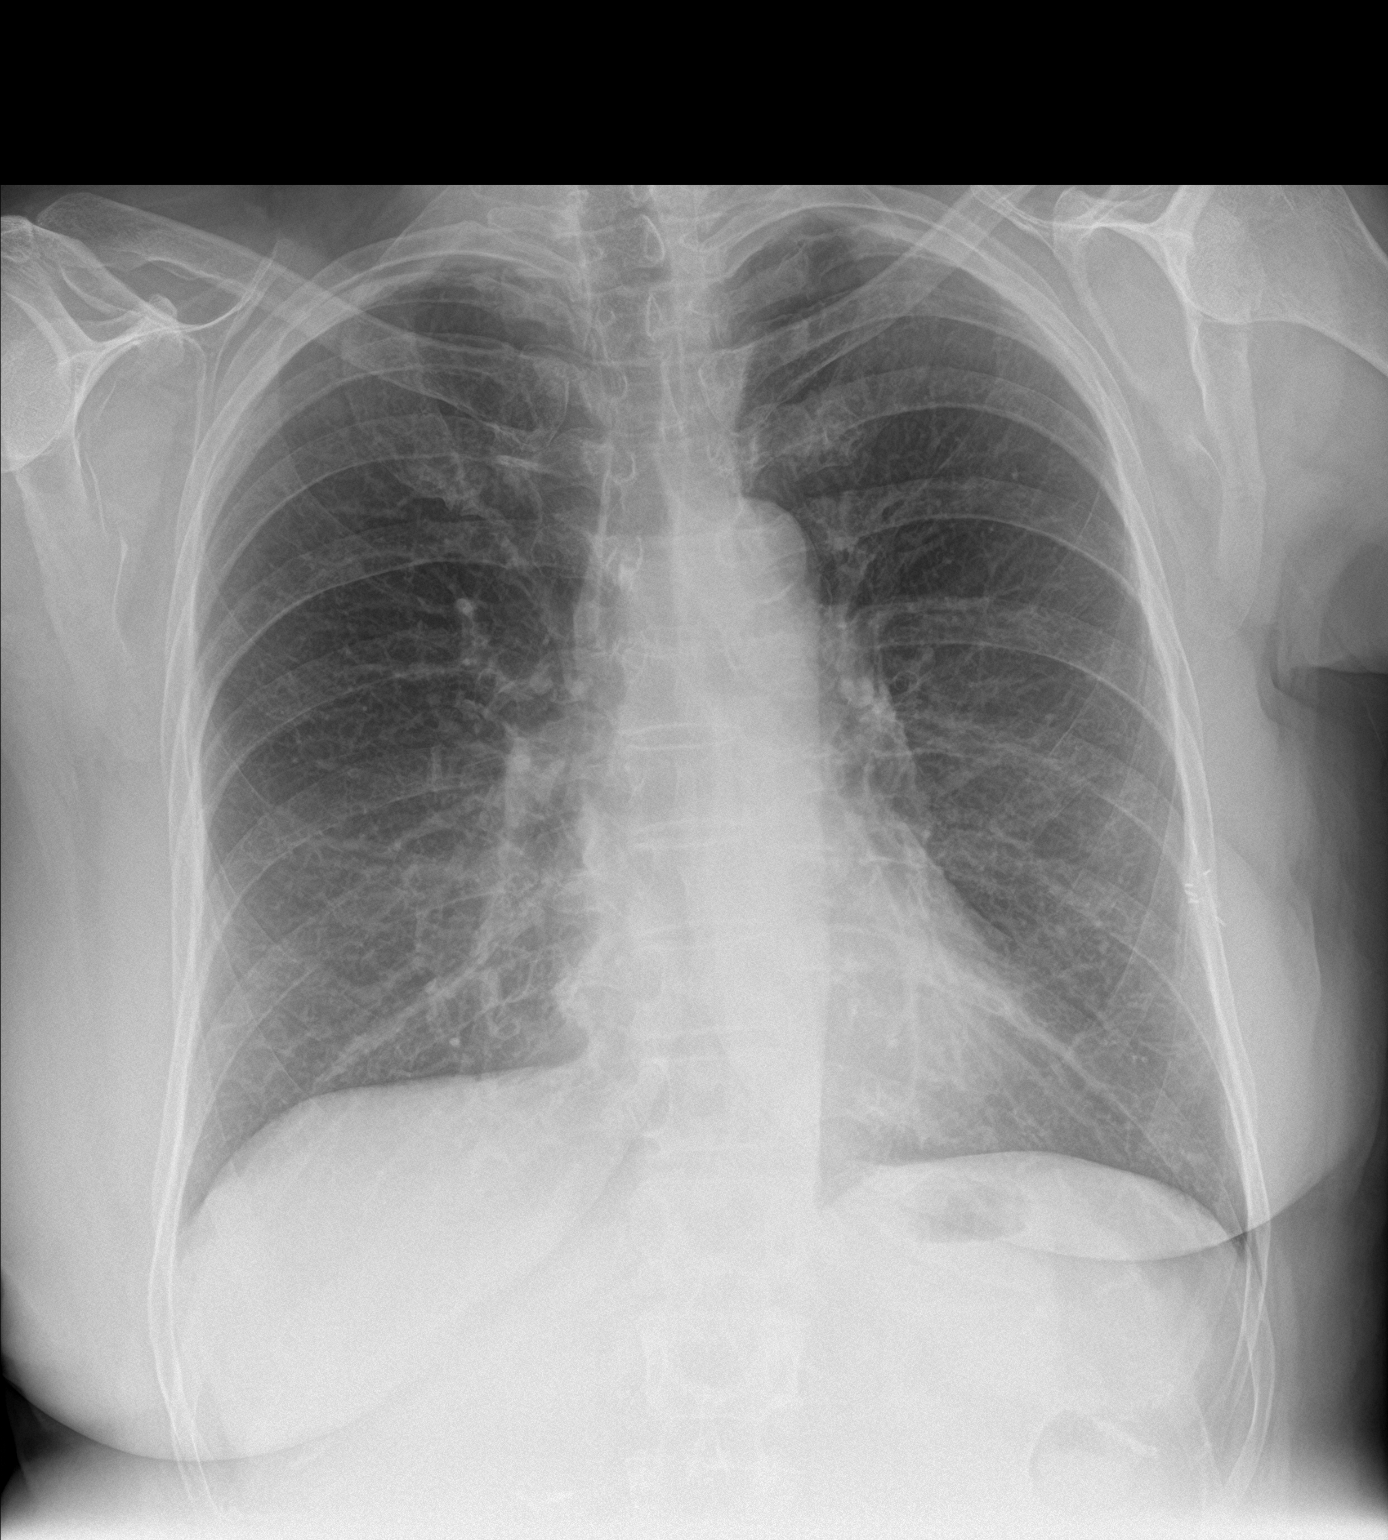

[chest lat]
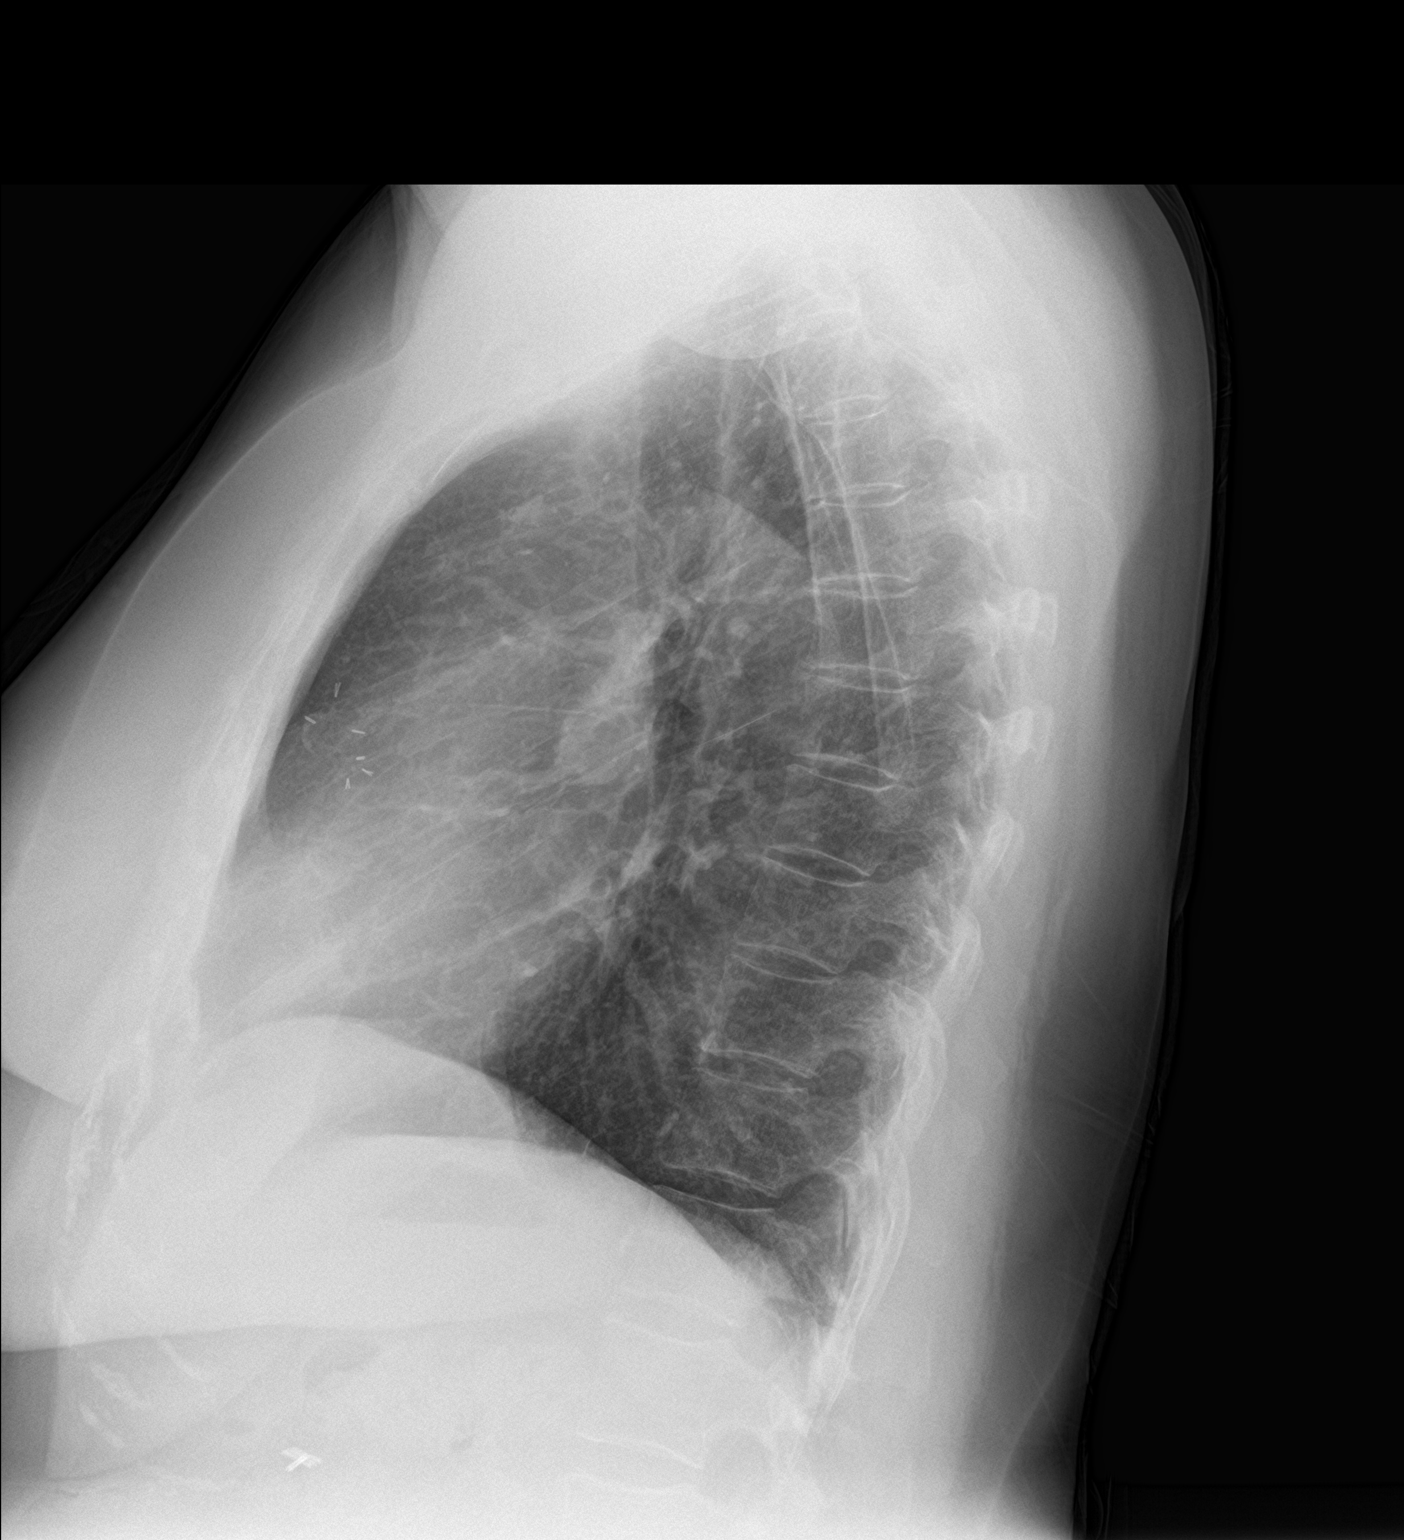

[2 of 2 positions shown; findings below may reference images not displayed]

FINDINGS: Cardiac shadow is within normal limits. Stable aortic calcifications
are seen. Postsurgical change in the cervical spine is noted. The
lungs are well aerated bilaterally. No focal infiltrate or sizable
effusion is seen.
IMPRESSION: No active cardiopulmonary disease.

## 2019-12-01 NOTE — Progress Notes (Signed)
Subjective: 69 year old female presents the office today for follow-up evaluation of wound on the right second toe and for fractures bilaterally.  She states is not having any pain on the fractures in the right side but she gets occasional discomfort of the right second toe where the wound is at.  She has been keeping the Iodosorb on the area daily as well as using the toe separator.  Denies any drainage or pus or swelling increase or redness.  Still has some occasional discomfort in the left foot on the area the fractures.  She is using the bone stimulator bilaterally.  Denies any systemic complaints such as fevers, chills, nausea, vomiting. No acute changes since last appointment, and no other complaints at this time.    Also her prednisone dose has been increased due to arthritis.  She is starting to  wean down on this.  Objective: AAO x3, NAD DP/PT pulses palpable bilaterally, CRT less than 3 seconds Ulceration of the medial right second toe on the PIPJ although granular wound base is present.  The wound is more superficial but still present measuring 0.4 x 0.4 cm with a granular wound base.  There is no probing, undermining or tunneling.  There is no drainage or pus.  There is faint periwound erythema but there is no ascending cellulitis.  There is no fluctuation or crepitation but there is no malodor.  Regards to fractures there is no pain on the right foot.  Minimal discomfort of the left fourth and fifth metatarsal base.  No other areas of discomfort.  No pain of the ankles.  No pain with calf compression, swelling, warmth, erythema  Assessment: Ulceration right second toe; metatarsal fractures  Plan: -All treatment options discussed with the patient including all alternatives, risks, complications.  -X-rays obtained reviewed.  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  Fractures noted the fourth and fifth metatarsals of the left  foot as well with callus formation.  In regards to the right second toe there is no evidence of osteomyelitis.  No soft tissue -I sharply debrided the wound on the right second toe without any complications none healthy, granular tissue.  Wound continue with I disorder dressing changes and continue offloading.  Wound is getting better but again I think due to her chronic steroid use this is also contributing to delayed healing -In regards to fractures given her comorbidity as well as chronic steroid use this is contributing to nonunion.  Continue bone stimulator.  At this point on the right side it is an asymptomatic nonunion.  We will continue to recommend immobilization on the left side.  Trula Slade DPM

## 2019-12-02 ENCOUNTER — Ambulatory Visit
Admission: RE | Admit: 2019-12-02 | Discharge: 2019-12-02 | Disposition: A | Discharge status: Home / Self Care | Payer: Medicare PPO | Source: Ambulatory Visit | Attending: Nephrology | Admitting: Nephrology

## 2019-12-02 ENCOUNTER — Other Ambulatory Visit (HOSPITAL_COMMUNITY): Payer: Self-pay | Admitting: *Deleted

## 2019-12-02 DIAGNOSIS — N183 Chronic kidney disease, stage 3 unspecified: Secondary | ICD-10-CM | POA: Diagnosis not present

## 2019-12-02 DIAGNOSIS — N1832 Chronic kidney disease, stage 3b: Secondary | ICD-10-CM

## 2019-12-02 NOTE — Discharge Instructions (Signed)

## 2019-12-03 ENCOUNTER — Other Ambulatory Visit (INDEPENDENT_AMBULATORY_CARE_PROVIDER_SITE_OTHER): Payer: Medicare PPO

## 2019-12-03 ENCOUNTER — Encounter (HOSPITAL_COMMUNITY)
Admission: RE | Admit: 2019-12-03 | Discharge: 2019-12-03 | Disposition: A | Discharge status: Home / Self Care | Payer: Medicare PPO | Source: Ambulatory Visit | Attending: Nephrology | Admitting: Nephrology

## 2019-12-03 ENCOUNTER — Other Ambulatory Visit: Payer: Self-pay

## 2019-12-03 DIAGNOSIS — N289 Disorder of kidney and ureter, unspecified: Secondary | ICD-10-CM | POA: Diagnosis not present

## 2019-12-03 DIAGNOSIS — D631 Anemia in chronic kidney disease: Secondary | ICD-10-CM | POA: Insufficient documentation

## 2019-12-03 DIAGNOSIS — N183 Chronic kidney disease, stage 3 unspecified: Secondary | ICD-10-CM | POA: Insufficient documentation

## 2019-12-03 LAB — BASIC METABOLIC PANEL
BUN: 52 mg/dL — ABNORMAL HIGH (ref 6–23)
CO2: 24 mEq/L (ref 19–32)
Calcium: 9.2 mg/dL (ref 8.4–10.5)
Chloride: 98 mEq/L (ref 96–112)
Creatinine, Ser: 2.23 mg/dL — ABNORMAL HIGH (ref 0.40–1.20)
GFR: 21.36 mL/min — ABNORMAL LOW (ref >60.00)
Glucose, Bld: 125 mg/dL — ABNORMAL HIGH (ref 70–99)
Potassium: 4.6 mEq/L (ref 3.5–5.1)
Sodium: 135 mEq/L (ref 135–145)

## 2019-12-03 MED ORDER — SODIUM CHLORIDE 0.9 % IV SOLN
510.0000 mg | INTRAVENOUS | Status: DC
  Administered 2019-12-03: 510 mg via INTRAVENOUS
  Filled 2019-12-03: qty 510

## 2019-12-04 ENCOUNTER — Telehealth: Payer: Self-pay | Admitting: Critical Care Medicine

## 2019-12-04 DIAGNOSIS — J479 Bronchiectasis, uncomplicated: Secondary | ICD-10-CM

## 2019-12-04 NOTE — Progress Notes (Signed)
Please have her stop the bactrim due to worsening kidney function. We or her PCP should repeat BMP to monitor kidney function in another week. There is not another oral antibiotic that will work for this organism, so my plan is to stop antibiotics all together and see if she had improvement in her symptoms. If she requires additional treatment, the only other options are IV and she would have to go to an ID doctor for that. Thanks! LPC

## 2019-12-04 NOTE — Telephone Encounter (Signed)
Spoke with patient to let her know about Dr. Ainsley Spinner recs and for her to stop taking Bactrim. Will order lab work for her to get done next week. She states her symptoms have not got better, she coughed up a lot of nasty mucus this morning. Will route to Dr. Carlis Abbott as Juluis Rainier. Nothing further needed at this time.

## 2019-12-04 NOTE — Progress Notes (Signed)
Ok, if her symptoms are persistent, repeat sputum culture. My plan is to refer to ID if still growing the same organism. She can bring the sputum sample next week when she comes if she wants to. LPC

## 2019-12-10 ENCOUNTER — Encounter (HOSPITAL_COMMUNITY)
Admission: RE | Admit: 2019-12-10 | Discharge: 2019-12-10 | Disposition: A | Discharge status: Home / Self Care | Payer: Medicare PPO | Source: Ambulatory Visit | Attending: Nephrology | Admitting: Nephrology

## 2019-12-10 ENCOUNTER — Other Ambulatory Visit: Payer: Self-pay

## 2019-12-10 ENCOUNTER — Other Ambulatory Visit (INDEPENDENT_AMBULATORY_CARE_PROVIDER_SITE_OTHER): Payer: Medicare PPO

## 2019-12-10 DIAGNOSIS — N183 Chronic kidney disease, stage 3 unspecified: Secondary | ICD-10-CM | POA: Diagnosis not present

## 2019-12-10 DIAGNOSIS — J479 Bronchiectasis, uncomplicated: Secondary | ICD-10-CM

## 2019-12-10 DIAGNOSIS — D631 Anemia in chronic kidney disease: Secondary | ICD-10-CM | POA: Diagnosis not present

## 2019-12-10 LAB — BASIC METABOLIC PANEL
BUN: 47 mg/dL — ABNORMAL HIGH (ref 6–23)
CO2: 25 mEq/L (ref 19–32)
Calcium: 9.5 mg/dL (ref 8.4–10.5)
Chloride: 100 mEq/L (ref 96–112)
Creatinine, Ser: 1.73 mg/dL — ABNORMAL HIGH (ref 0.40–1.20)
GFR: 31 mL/min — ABNORMAL LOW (ref >60.00)
Glucose, Bld: 136 mg/dL — ABNORMAL HIGH (ref 70–99)
Potassium: 3.5 mEq/L (ref 3.5–5.1)
Sodium: 137 mEq/L (ref 135–145)

## 2019-12-10 MED ORDER — SODIUM CHLORIDE 0.9 % IV SOLN
510.0000 mg | INTRAVENOUS | Status: DC
  Administered 2019-12-10: 510 mg via INTRAVENOUS
  Filled 2019-12-10: qty 510

## 2019-12-10 NOTE — Discharge Instructions (Signed)

## 2019-12-10 NOTE — Progress Notes (Signed)
Please let her know that her kidney function is back closer to her recent baseline after being off the bactrim. I would recommend having her PCP or kidney doctor keep a very close follow up on her kidney function. We are very limited in antibiotics we can use with her kidney disease unfortunately.

## 2019-12-11 ENCOUNTER — Telehealth: Payer: Self-pay | Admitting: Critical Care Medicine

## 2019-12-11 NOTE — Telephone Encounter (Signed)
Ebony Hy, DO  12/10/2019 12:43 PM EDT     Please let her know that her kidney function is back closer to her recent baseline after being off the bactrim. I would recommend having her PCP or kidney doctor keep a very close follow up on her kidney function. We are very limited in antibiotics we can use with her kidney disease unfortunately.   Called and spoke with pt letting her know the results of labwork and she verbalized understanding. Nothing further needed.

## 2019-12-15 DIAGNOSIS — J4540 Moderate persistent asthma, uncomplicated: Secondary | ICD-10-CM | POA: Diagnosis not present

## 2019-12-15 DIAGNOSIS — J471 Bronchiectasis with (acute) exacerbation: Secondary | ICD-10-CM | POA: Diagnosis not present

## 2019-12-15 DIAGNOSIS — M0600 Rheumatoid arthritis without rheumatoid factor, unspecified site: Secondary | ICD-10-CM | POA: Diagnosis not present

## 2019-12-15 DIAGNOSIS — I5032 Chronic diastolic (congestive) heart failure: Secondary | ICD-10-CM | POA: Diagnosis not present

## 2019-12-15 DIAGNOSIS — R0602 Shortness of breath: Secondary | ICD-10-CM | POA: Diagnosis not present

## 2019-12-15 DIAGNOSIS — J45909 Unspecified asthma, uncomplicated: Secondary | ICD-10-CM | POA: Diagnosis not present

## 2019-12-15 DIAGNOSIS — G894 Chronic pain syndrome: Secondary | ICD-10-CM | POA: Diagnosis not present

## 2019-12-15 DIAGNOSIS — G4733 Obstructive sleep apnea (adult) (pediatric): Secondary | ICD-10-CM | POA: Diagnosis not present

## 2019-12-15 DIAGNOSIS — G4736 Sleep related hypoventilation in conditions classified elsewhere: Secondary | ICD-10-CM | POA: Diagnosis not present

## 2019-12-16 ENCOUNTER — Other Ambulatory Visit (HOSPITAL_COMMUNITY): Payer: Self-pay | Admitting: Internal Medicine

## 2019-12-16 ENCOUNTER — Ambulatory Visit: Payer: Medicare PPO | Attending: Internal Medicine

## 2019-12-16 DIAGNOSIS — Z23 Encounter for immunization: Secondary | ICD-10-CM

## 2019-12-16 NOTE — Progress Notes (Signed)
   Covid-19 Vaccination Clinic  Name:  THEDORA RINGS    MRN: [id number] DOB: 28-Feb-1950  12/16/2019  Ms. Danielski was observed post Covid-19 immunization for 15 minutes without incident. She was provided with Vaccine Information Sheet and instruction to access the V-Safe system.   Ms. Reaume was instructed to call 911 with any severe reactions post vaccine: Marland Kitchen Difficulty breathing  . Swelling of face and throat  . A fast heartbeat  . A bad rash all over body  . Dizziness and weakness

## 2019-12-17 ENCOUNTER — Ambulatory Visit
Admission: RE | Admit: 2019-12-17 | Discharge: 2019-12-17 | Disposition: A | Discharge status: Home / Self Care | Payer: Medicare PPO | Source: Ambulatory Visit | Attending: Family Medicine | Admitting: Family Medicine

## 2019-12-17 ENCOUNTER — Ambulatory Visit: Payer: Medicare PPO | Admitting: Podiatry

## 2019-12-17 ENCOUNTER — Other Ambulatory Visit: Payer: Self-pay

## 2019-12-17 ENCOUNTER — Ambulatory Visit: Payer: Medicare PPO | Admitting: Family Medicine

## 2019-12-17 VITALS — BP 140/80 | HR 84 | Temp 97.4°F | Ht 61.0 in | Wt 141.0 lb

## 2019-12-17 DIAGNOSIS — S92302D Fracture of unspecified metatarsal bone(s), left foot, subsequent encounter for fracture with routine healing: Secondary | ICD-10-CM

## 2019-12-17 DIAGNOSIS — L97511 Non-pressure chronic ulcer of other part of right foot limited to breakdown of skin: Secondary | ICD-10-CM

## 2019-12-17 DIAGNOSIS — G8929 Other chronic pain: Secondary | ICD-10-CM

## 2019-12-17 DIAGNOSIS — M7989 Other specified soft tissue disorders: Secondary | ICD-10-CM | POA: Diagnosis not present

## 2019-12-17 DIAGNOSIS — M25511 Pain in right shoulder: Secondary | ICD-10-CM

## 2019-12-17 DIAGNOSIS — S92301K Fracture of unspecified metatarsal bone(s), right foot, subsequent encounter for fracture with nonunion: Secondary | ICD-10-CM

## 2019-12-17 MED ORDER — TRAMADOL HCL 50 MG PO TABS: 50.0000 mg | ORAL_TABLET | Freq: Three times a day (TID) | ORAL | 0 refills | Status: AC | PRN

## 2019-12-17 NOTE — Progress Notes (Signed)
Subjective:    Patient ID: Ebony Scott, female    DOB: 03/02/1950, 69 y.o.   MRN: [id number]  HPI  Patient is a 69 year old Caucasian female with a history of seronegative rheumatoid arthritis currently on prednisone 10 mg a day, remote history of a DVT and PE, renal insufficiency, iatrogenic Cushing syndrome with type 2 diabetes mellitus.  Recently saw a renal who felt that her chronic kidney disease was likely just due to chronic kidney disease and was not due to any underlying untreated medical abnormality.  Therefore I would recommend checking her renal function every 4 months.  Her last hemoglobin A1c was 6.7 in July.  She will be due to recheck this around January.  Therefore we have decided to do fasting lab work in February to monitor her cholesterol, renal function, and A1c.  However she presents today due to severe pain in her shoulders right greater than left.  She fell in January and injured her right shoulder when she fell.  It is never been right since the fall.  We recommended a physical therapy but however she declined physical therapy due to her respiratory issues during the Covid pandemic.  The pain is worsening.  She reports decreased range of motion.  She is having pain with internal rotation, pain with abduction.  It hurts constantly.  It hurts to sleep on her shoulder at night.  It keeps her awake.  She also reports audible crepitus in the shoulder with range of motion.  She has similar symptoms in the left but the right is greater. Past Medical History:  Diagnosis Date  . Acute deep vein thrombosis (DVT) of right lower extremity ([place]) 12/13/2017  . Allergic rhinitis   . Allergy    SEASONAL  . Anemia   . Anxiety    pt denies  . Arthritis    Phreesia 10/21/2019  . Asthma   . Asthma    Phreesia 10/21/2019  . Breast cancer ([place]) 1998   in remission  . Cataract    REMOVED  . Clostridium difficile colitis 01/14/2018  . Clostridium difficile diarrhea 01/14/2018  .  Complication of anesthesia    "had hard time waking up from it several times" (02/20/2012)  . Depression    "some; don't take anything for it" (02/20/2012)  . Diverticulosis   . DVT (deep venous thrombosis) ([place])   . Exertional dyspnea   . Fibromyalgia 11/2011  . GERD (gastroesophageal reflux disease)   . Graves disease   . Headache(784.0)    "related to allergies; more at different times during the year" (02/20/2012)  . Hemorrhoids   . Hiatal hernia    back and neck  . Hx of adenomatous colonic polyps 04/12/2016  . Hypercholesteremia    good cholesterol is high  . Hypothyroidism   . IBS (irritable bowel syndrome)   . Moderate persistent asthma    -FeV1 72% 2011, -IgE 102 2011, CT sinus Neg 2011  . Osteoporosis    on reclast yearly  . Pneumonia 04/2011; ~ 11/2011   "double; single" (02/20/2012)  . Seronegative rheumatoid arthritis ([place])    Dr. Lahoma Rocker  . SIRS (systemic inflammatory response syndrome) ([place]) 02/10/2018  . Tracheobronchomalacia    Past Surgical History:  Procedure Laterality Date  . ABDOMINAL HYSTERECTOMY N/A    Phreesia 10/21/2019  . ANTERIOR AND POSTERIOR REPAIR  1990's  . APPENDECTOMY    . BREAST LUMPECTOMY  1998   left  . BREAST SURGERY N/A    Phreesia  10/21/2019  . CARPOMETACARPEL ([place]) FUSION OF THUMB WITH AUTOGRAFT FROM RADIUS  ~ 2009   "both thumbs" (02/20/2012)  . CATARACT EXTRACTION W/ INTRAOCULAR LENS  IMPLANT, BILATERAL  2012  . CERVICAL DISCECTOMY  10/2001   C5-C6  . CERVICAL FUSION  2003   C3-C4  . CHOLECYSTECTOMY    . COLONOSCOPY    . DEBRIDEMENT TENNIS ELBOW  ?1970's   right  . ESOPHAGOGASTRODUODENOSCOPY    . EYE SURGERY N/A    Phreesia 10/21/2019  . HYSTERECTOMY    . KNEE ARTHROPLASTY  ?1990's   "?right; w/cartilage repair" (02/20/2012)  . NASAL SEPTUM SURGERY  1980's  . POSTERIOR CERVICAL FUSION/FORAMINOTOMY  2004   "failed initial fusion; rewired  anterior neck" (02/20/2012)  . SPINE SURGERY N/A    Phreesia 10/21/2019  .  TONSILLECTOMY  ~ 1953  . VESICOVAGINAL FISTULA CLOSURE W/ TAH  1988  . VIDEO BRONCHOSCOPY Bilateral 08/23/2016   Procedure: VIDEO BRONCHOSCOPY WITH FLUORO;  Surgeon: Javier Glazier, MD;  Location: [facility];  Service: Cardiopulmonary;  Laterality: Bilateral;   Current Outpatient Medications on File Prior to Visit  Medication Sig Dispense Refill  . acetaminophen (TYLENOL) 650 MG CR tablet Take 1,300 mg by mouth every 8 (eight) hours as needed for pain.    Marland Kitchen albuterol (PROAIR HFA) 108 (90 Base) MCG/ACT inhaler Inhale 2 puffs into the lungs every 4 (four) hours as needed for wheezing or shortness of breath. 18 g 5  . allopurinol (ZYLOPRIM) 100 MG tablet     . Alpha-D-Galactosidase (BEANO PO) Take 1 capsule by mouth 3 (three) times daily as needed (gas/ bloating).     . Alpha-Lipoic Acid 600 MG CAPS Take 600 mg by mouth daily.     Marland Kitchen alum & mag hydroxide-simeth (MAALOX/MYLANTA) 200-200-20 MG/5ML suspension Take 15 mLs by mouth every 4 (four) hours as needed for indigestion or heartburn. 355 mL 0  . ammonium lactate (AMLACTIN) 12 % cream Apply topically as needed for dry skin. 385 g 2  . atorvastatin (LIPITOR) 10 MG tablet TAKE 1 TABLET BY MOUTH DAILY 90 tablet 3  . azelastine (ASTELIN) 0.1 % nasal spray USE 2 SPRAYS IN EACH NOSTRIL DAILY AS DIRECTED 30 mL 5  . Bacillus Coagulans-Inulin (ALIGN PREBIOTIC-PROBIOTIC PO) Take by mouth every evening.    Marland Kitchen CALCIUM-MAGNESIUM PO Take 1 tablet by mouth 2 (two) times daily.     . cetirizine (ZYRTEC) 10 MG tablet Take 5 mg by mouth every evening.     . cholecalciferol (VITAMIN D) 25 MCG (1000 UT) tablet Take 1,000 Units by mouth daily.     . cyproheptadine (PERIACTIN) 4 MG tablet Take 2 tablets (8 mg total) by mouth every evening. 180 tablet 2  . denosumab (PROLIA) 60 MG/ML SOLN injection Inject 60 mg into the skin every 6 (six) months. Administer in upper arm, thigh, or abdomen    . dexlansoprazole (DEXILANT) 60 MG capsule TAKE 1 CAPSULE BY MOUTH EACH  MORNING 90 capsule 3  . diclofenac Sodium (VOLTAREN) 1 % GEL APPLY 2 GRAMS TOPICALLY TWICE DAILY 350 g 3  . EPINEPHrine (EPIPEN 2-PAK) 0.3 mg/0.3 mL IJ SOAJ injection Inject 0.3 mLs (0.3 mg total) into the muscle Once PRN. 0.3 mL 2  . fluticasone (FLONASE) 50 MCG/ACT nasal spray Place 2 sprays into both nostrils daily. 16 g 2  . furosemide (LASIX) 40 MG tablet TAKE 1 TABLET BY MOUTH DAILY AS NEEDED 30 tablet 3  . gabapentin (NEURONTIN) 600 MG tablet Take 1 tablet (600 mg  total) by mouth daily. 90 tablet 3  . guaiFENesin (MUCINEX) 600 MG 12 hr tablet Take by mouth 2 (two) times daily.    . Lactase (LACTAID FAST ACT) 9000 units TABS Take 18,000 Units by mouth 4 (four) times daily as needed (dairy consumption).     Marland Kitchen lidocaine (LIDODERM) 5 % Place 1 patch onto the skin daily as needed (pain). Remove & Discard patch within 12 hours or as directed by MD    . mirtazapine (REMERON SOL-TAB) 30 MG disintegrating tablet DISSOLVE 1 TABLET BY MOUTH EVERY NIGHT AT BEDTIME 90 tablet 1  . mometasone (NASONEX) 50 MCG/ACT nasal spray Place 2 sprays into the nose daily. 51 g 0  . mometasone-formoterol (DULERA) 200-5 MCG/ACT AERO Inhale 2 puffs into the lungs 2 (two) times daily. 39 g 1  . montelukast (SINGULAIR) 10 MG tablet TAKE 1 TABLET BY MOUTH DAILY 30 tablet 1  . mupirocin ointment (BACTROBAN) 2 % Apply 1 application topically 2 (two) times daily. 30 g 2  . nystatin (MYCOSTATIN) 100000 UNIT/ML suspension SWISH AND SPIT 5 MLS TWICE DAILY 473 mL 3  . nystatin-triamcinolone ointment (MYCOLOG) Apply topically 2 (two) times daily. 90 g 1  . predniSONE (DELTASONE) 10 MG tablet Take 10 mg by mouth daily with breakfast.    . Prenatal Vit-Fe Fumarate-FA (PRENATAL MULTIVITAMIN) TABS tablet Take 1 tablet by mouth daily at 12 noon.    Marland Kitchen Propylene Glycol (SYSTANE COMPLETE) 0.6 % SOLN Apply to eye.    Marland Kitchen Respiratory Therapy Supplies (FLUTTER) DEVI Use as directed 1 each 0  . rivaroxaban (XARELTO) 20 MG TABS tablet TAKE 1  TABLET BY MOUTH DAILY WITH SUPPER 90 tablet 3  . sodium chloride HYPERTONIC 3 % nebulizer solution Take by nebulization 2 (two) times daily. 750 mL 12  . Spacer/Aero-Holding Chambers (AEROCHAMBER PLUS) inhaler Use as instructed 1 each 0  . sulfamethoxazole-trimethoprim (BACTRIM DS) 800-160 MG tablet Take 1 tablet by mouth 2 (two) times daily. 14 tablet 0  . Tiotropium Bromide Monohydrate (SPIRIVA RESPIMAT) 2.5 MCG/ACT AERS Inhale 1 puff into the lungs daily. 4 g 11   Current Facility-Administered Medications on File Prior to Visit  Medication Dose Route Frequency Provider Last Rate Last Admin  . denosumab (PROLIA) injection 60 mg  60 mg Subcutaneous Q6 months [name], MD   60 mg at 09/23/19 1108   Allergies  Allergen Reactions  . Dust Mite Extract Shortness Of Breath and Other (See Comments)    "sneezing" (02/20/2012)  . Molds & Smuts Shortness Of Breath  . Morphine And Related Hives and Itching  . Morphine Sulfate Itching  . Other Shortness Of Breath and Other (See Comments)    Grass and weeds "sneezing; filled sinuses" (02/20/2012)  . Penicillins Rash and Other (See Comments)    "welts" (02/20/2012) Has patient had a PCN reaction causing immediate rash, facial/tongue/throat swelling, SOB or lightheadedness with hypotension: Unknown Has patient had a PCN reaction causing severe rash involving mucus membranes or skin necrosis: No Has patient had a PCN reaction that required hospitalization: No Has patient had a PCN reaction occurring within the last 10 years: No If all of the above answers are "NO", then may proceed with Cephalosporin use.   Marland Kitchen Reclast [Zoledronic Acid] Other (See Comments)    Fever, Put in hospital, dr said it was a reaction from a reaction   . Rofecoxib Swelling    Vioxx REACTION: feet swelling  . Shellfish Allergy Itching, Rash, Shortness Of Breath and Swelling  .  Shrimp Flavor Anaphylaxis    ALL SHELLFISH  . Tetracycline Hcl Nausea And Vomiting  . Xolair  [Omalizumab] Other (See Comments)    Caused Blood clot  . Dilaudid [Hydromorphone Hcl] Itching  . Hydrocodone-Acetaminophen Nausea And Vomiting  . Levofloxacin Other (See Comments)    REACTION: GI upset  . Oxycodone Hcl Nausea And Vomiting  . Paroxetine Nausea And Vomiting    Paxil   . Celecoxib Swelling    Feet swelling  . Diltiazem Swelling  . Lactose Intolerance (Gi) Other (See Comments)    Bloating and gas  . Tree Extract Other (See Comments)    "tested and told I was allergic to it; never experienced a reaction to it" (02/20/2012)   Social History   Socioeconomic History  . Marital status: Divorced    Spouse name: Not on file  . Number of children: 2  . Years of education: college  . Highest education level: Not on file  Occupational History  . Occupation: Disabled    Comment: Retired Engineer, production: RETIRED  Tobacco Use  . Smoking status: Passive Smoke Exposure - Never Smoker  . Smokeless tobacco: Never Used  . Tobacco comment: Parents  Vaping Use  . Vaping Use: Never used  Substance and Sexual Activity  . Alcohol use: Not Currently    Alcohol/week: 0.0 standard drinks  . Drug use: No  . Sexual activity: Never  Other Topics Concern  . Not on file  Social History Narrative   Patient lives at home alone. Patient  divorced.    Patient has her BS degree.   Right handed.   Caffeine- sometimes coffee.      [facility]:   Born in [place], [place]. She worked as a Copywriter, advertising. She has no pets currently. She does have indoor plants. Previously had mold in her home that was remediated. Carpet was removed.          Social Determinants of Health   Financial Resource Strain:   . Difficulty of Paying Living Expenses: Not on file  Food Insecurity:   . Worried About Charity fundraiser in the Last Year: Not on file  . Ran Out of Food in the Last Year: Not on file  Transportation Needs:   . Lack of Transportation (Medical): Not on  file  . Lack of Transportation (Non-Medical): Not on file  Physical Activity:   . Days of Exercise per Week: Not on file  . Minutes of Exercise per Session: Not on file  Stress:   . Feeling of Stress : Not on file  Social Connections:   . Frequency of Communication with Friends and Family: Not on file  . Frequency of Social Gatherings with Friends and Family: Not on file  . Attends Religious Services: Not on file  . Active Member of Clubs or Organizations: Not on file  . Attends Archivist Meetings: Not on file  . Marital Status: Not on file  Intimate Partner Violence:   . Fear of Current or Ex-Partner: Not on file  . Emotionally Abused: Not on file  . Physically Abused: Not on file  . Sexually Abused: Not on file     Review of Systems  All other systems reviewed and are negative.      Objective:   Physical Exam Vitals reviewed.  Constitutional:      General: She is not in acute distress.    Appearance: Normal appearance. She is well-developed. She is  not ill-appearing or toxic-appearing.  HENT:     Right Ear: Tympanic membrane and ear canal normal.     Left Ear: Tympanic membrane and ear canal normal.     Nose: No congestion or rhinorrhea.  Cardiovascular:     Rate and Rhythm: Normal rate and regular rhythm.     Heart sounds: Normal heart sounds.  Pulmonary:     Effort: Pulmonary effort is normal. No respiratory distress.     Breath sounds: No stridor or decreased air movement. No wheezing or rales.  Abdominal:     General: Bowel sounds are normal. There is no distension.     Palpations: Abdomen is soft.     Tenderness: There is no abdominal tenderness. There is no guarding.  Musculoskeletal:     Right shoulder: Tenderness, bony tenderness and crepitus present. No swelling, deformity or effusion. Decreased range of motion. Decreased strength.     Right lower leg: Edema present.     Left lower leg: Edema present.  Skin:    Findings: No erythema.   Neurological:     Mental Status: She is alert and oriented to person, place, and time.     Cranial Nerves: No cranial nerve deficit.     Sensory: No sensory deficit.     Motor: No weakness.     Coordination: Coordination normal.   Moon facies, buffalo hump       Assessment & Plan:  Chronic right shoulder pain - Plan: DG Shoulder Right Patient cannot use NSAIDs due to chronic kidney disease.  Tramadol is not alleviating the pain.  Therefore using sterile technique, I injected the right subacromial space with 2 cc lidocaine, 2 cc of Marcaine, and 2 cc of 40 mg/mL Kenalog.  The patient tolerated the procedure well without complication.  I will obtain an x-ray of the right shoulder.  If the pain does not improve, the next that would be an MRI to evaluate for rotator cuff tear.  If the pain does improve, I would recommend a physical therapy consultation

## 2019-12-19 NOTE — Progress Notes (Signed)
Subjective: 69 year old female presents the office today for follow-up evaluation of wound on the right second toe and for fractures bilaterally.  She states that she still has been posttraumatic.  She gets occasional discomfort of the foot on the fracture sites.  She is been wearing a regular shoe for the last 4 days patient with some swelling towards the end of the day.  She also has the wound on the medial right second toe which is doing well but has been keeping a toe separator to the area and applying iodosorb to the wound daily.  She denies any drainage or pus or swelling or redness.  Her main concern is the cyst that she gets on the top of the right foot.  This location cause discomfort she was asleep with me during today.  She recently just had a steroid injection into her shoulder today as well. Denies any systemic complaints such as fevers, chills, nausea, vomiting. No acute changes since last appointment, and no other complaints at this time.   Objective: AAO x3, NAD DP/PT pulses palpable bilaterally, CRT less than 3 seconds Ulceration of the medial right second toe on the PIPJ although granular wound base is present.  The wound is superficial today almost certainly scabbed over.  There is no change drainage or pus or surrounding erythema, ascending cellulitis there is no fluctuation crepitation and there is no malodor. No significant discomfort of the fourth metatarsal bases bilaterally.  On the dorsal medial aspect of the left foot there is a fluid-filled soft tissue mass consistent with a ganglion cyst.  She states that she'll get this throughout the day after she has been on her feet all day and has resolved more after being off of her foot.  There is no erythema or warmth associated with this. No open lesions otherwise No pain with calf compression, swelling, warmth, erythema  Assessment: Ulceration right second toe; metatarsal fractures;   Plan: -All treatment options discussed with  the patient including all alternatives, risks, complications.  -Debrided the wound of the right second toe any complications none healthy, viable tissue.  Continue with Iodosorb dressing changes daily as well as offloading. -She was to have the cyst drained on the left side.  I cleared him with alcohol 3 cc of lidocaine, Marcaine plain was infiltrated.  I utilized an 18-gauge needle at the skin was prepped Betadine to try to aspirate the cyst and only bloody drainage expressed.  There is no purulence or signs of infection.  She tolerated well.  Antibiotic ointment and a bandage was applied followed by compression bandage. -Continue immobilization and bone stimulator. REPEAT X-RAY NEXT [facility]

## 2019-12-21 ENCOUNTER — Other Ambulatory Visit: Payer: Medicare PPO

## 2019-12-21 ENCOUNTER — Other Ambulatory Visit: Payer: Self-pay

## 2019-12-21 DIAGNOSIS — J479 Bronchiectasis, uncomplicated: Secondary | ICD-10-CM | POA: Diagnosis not present

## 2019-12-21 DIAGNOSIS — M810 Age-related osteoporosis without current pathological fracture: Secondary | ICD-10-CM | POA: Diagnosis not present

## 2019-12-21 DIAGNOSIS — Z23 Encounter for immunization: Secondary | ICD-10-CM | POA: Diagnosis not present

## 2019-12-21 DIAGNOSIS — N1831 Chronic kidney disease, stage 3a: Secondary | ICD-10-CM | POA: Diagnosis not present

## 2019-12-21 DIAGNOSIS — M79643 Pain in unspecified hand: Secondary | ICD-10-CM | POA: Diagnosis not present

## 2019-12-21 DIAGNOSIS — Z7952 Long term (current) use of systemic steroids: Secondary | ICD-10-CM | POA: Diagnosis not present

## 2019-12-21 DIAGNOSIS — R748 Abnormal levels of other serum enzymes: Secondary | ICD-10-CM | POA: Diagnosis not present

## 2019-12-21 DIAGNOSIS — M0609 Rheumatoid arthritis without rheumatoid factor, multiple sites: Secondary | ICD-10-CM | POA: Diagnosis not present

## 2019-12-21 DIAGNOSIS — M7989 Other specified soft tissue disorders: Secondary | ICD-10-CM | POA: Diagnosis not present

## 2019-12-21 DIAGNOSIS — Z79899 Other long term (current) drug therapy: Secondary | ICD-10-CM | POA: Diagnosis not present

## 2019-12-23 ENCOUNTER — Telehealth: Payer: Self-pay | Admitting: Critical Care Medicine

## 2019-12-23 ENCOUNTER — Telehealth: Payer: Self-pay | Admitting: Family Medicine

## 2019-12-23 NOTE — Telephone Encounter (Signed)
Please Advise

## 2019-12-23 NOTE — Telephone Encounter (Signed)
Patient called back and was informed that her results are not back as of yet. She expressed understanding. Nothing further needed at this time

## 2019-12-23 NOTE — Telephone Encounter (Signed)
Right shoulder still in pain would like to see if Dr.[name] can order a MRI if possible.

## 2019-12-23 NOTE — Telephone Encounter (Signed)
Lm for patient.  

## 2019-12-24 ENCOUNTER — Encounter: Payer: Self-pay | Admitting: Internal Medicine

## 2019-12-24 ENCOUNTER — Other Ambulatory Visit: Payer: Self-pay | Admitting: Family Medicine

## 2019-12-24 ENCOUNTER — Ambulatory Visit: Payer: Medicare PPO | Admitting: Internal Medicine

## 2019-12-24 VITALS — BP 124/70 | HR 82 | Ht 61.0 in | Wt 137.0 lb

## 2019-12-24 DIAGNOSIS — M12811 Other specific arthropathies, not elsewhere classified, right shoulder: Secondary | ICD-10-CM

## 2019-12-24 DIAGNOSIS — K589 Irritable bowel syndrome without diarrhea: Secondary | ICD-10-CM

## 2019-12-24 DIAGNOSIS — K6389 Other specified diseases of intestine: Secondary | ICD-10-CM

## 2019-12-24 DIAGNOSIS — M75101 Unspecified rotator cuff tear or rupture of right shoulder, not specified as traumatic: Secondary | ICD-10-CM

## 2019-12-24 DIAGNOSIS — G4700 Insomnia, unspecified: Secondary | ICD-10-CM | POA: Diagnosis not present

## 2019-12-24 MED ORDER — RIFAXIMIN 550 MG PO TABS: 550.0000 mg | ORAL_TABLET | Freq: Three times a day (TID) | ORAL | 0 refills | Status: DC

## 2019-12-24 NOTE — Patient Instructions (Signed)
  We are providing you with samples of xifaxan today to take one tablet three times a day for 14 days . You may take with or without food.   Follow up with Dr Carlean Purl in 6 months or sooner if needed.   I appreciate the opportunity to care for you. Silvano Rusk, MD, [facility]

## 2019-12-24 NOTE — Progress Notes (Signed)
Ebony Scott 69 y.o. [birth year] [id number]  Assessment & Plan:   Encounter Diagnoses  Name Primary?  . Irritable bowel syndrome without diarrhea Yes  . 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 Insomnia, unspecified type    Her symptoms have recurred and she is flaring regarding the IBS and possible small intestinal bacterial overgrowth. She responded nicely to Xifaxan last year so we will repeat that.  Her recent antibiotic usage could have upset the bacterial flora.  I think this is safe and reasonable to do in her setting.  She will see me again in 6-12 months sooner as needed.  Continue mirtazapine for insomnia.  CC: [name], MD   Subjective:   Chief Complaint: Increased gas and bloating  HPI Ebony Scott presents for follow-up she has a history of IBS diarrhea predominant and suspected small intestinal bacterial overgrowth, and has had recurrent bronchitis pulmonary infections with Pseudomonas and perhaps other bacteria recently requiring multiple rounds of antibiotics.  She has bronchiectasis and is being cared for by Dr. Carlis Abbott.  She had 2 Feraheme infusions recently coordinated by Dr. Moshe Cipro of nephrology.  Colonoscopy 2018 3 small adenomas.  Anticipate repeat 2023.  When I had seen her last in September 2020 samples of Xifaxan were given for similar symptoms and she said she had a wonderful improvement.  She continues to take mirtazapine at bedtime which helps insomnia. Wt Readings from Last 3 Encounters:  12/24/19 137 lb (62.1 kg)  12/17/19 141 lb (64 kg)  10/20/19 143 lb (64.9 kg)    Review of chart shows she has been treated by Dr. Carlis Abbott several times for her chronic hypoxic respiratory failure, asthma and bronchiectasis and also on immunosuppressive's for her rheumatoid arthritis.  Recurrent infections with Pseudomonas and then Achromobacter she is about the change treatment for rheumatoid arthritis but is waiting on a repeat culture of sputum to  determine if she needs antibiotics again.  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 Allergies  Allergen Reactions  . Dust Mite Extract Shortness Of Breath and Other (See Comments)    "sneezing" (02/20/2012)  . Molds & Smuts Shortness Of Breath  . Morphine And Related Hives and Itching  . Morphine Sulfate Itching  . Other Shortness Of Breath and Other (See Comments)    Grass and weeds "sneezing; filled sinuses" (02/20/2012)  . Penicillins Rash and Other (See Comments)    "welts" (02/20/2012) Has patient had a PCN reaction causing immediate rash, facial/tongue/throat swelling, SOB or lightheadedness with hypotension: Unknown Has patient had a PCN reaction causing severe rash involving mucus membranes or skin necrosis: No Has patient had a PCN reaction that required hospitalization: No Has patient had a PCN reaction occurring within the last 10 years: No If all of the above answers are "NO", then may proceed with Cephalosporin use.   Marland Kitchen Reclast [Zoledronic Acid] Other (See Comments)    Fever, Put in hospital, dr said it was a reaction from a reaction   . Rofecoxib Swelling    Vioxx REACTION: feet swelling  . Shellfish Allergy Itching, Rash, Shortness Of Breath and Swelling  . Shrimp Flavor Anaphylaxis    ALL SHELLFISH  . Tetracycline Hcl Nausea And Vomiting  . Xolair [Omalizumab] Other (See Comments)    Caused Blood clot  . Dilaudid [Hydromorphone Hcl] Itching  . Hydrocodone-Acetaminophen Nausea And Vomiting  . Levofloxacin Other (See Comments)    REACTION: GI upset  . Oxycodone Hcl Nausea And Vomiting  .  Paroxetine Nausea And Vomiting    Paxil   . Celecoxib Swelling    Feet swelling  . Diltiazem Swelling  . Lactose Intolerance (Gi) Other (See Comments)    Bloating and gas  . Tree Extract Other (See Comments)    "tested and told I was allergic to it; never experienced a reaction to it" (02/20/2012)   Current Meds  Medication Sig  . acetaminophen  (TYLENOL) 650 MG CR tablet Take 1,300 mg by mouth every 8 (eight) hours as needed for pain.  Marland Kitchen albuterol (PROAIR HFA) 108 (90 Base) MCG/ACT inhaler Inhale 2 puffs into the lungs every 4 (four) hours as needed for wheezing or shortness of breath.  . allopurinol (ZYLOPRIM) 100 MG tablet   . Alpha-D-Galactosidase (BEANO PO) Take 1 capsule by mouth 3 (three) times daily as needed (gas/ bloating).   . Alpha-Lipoic Acid 600 MG CAPS Take 600 mg by mouth daily.   Marland Kitchen alum & mag hydroxide-simeth (MAALOX/MYLANTA) 200-200-20 MG/5ML suspension Take 15 mLs by mouth every 4 (four) hours as needed for indigestion or heartburn.  Marland Kitchen ammonium lactate (AMLACTIN) 12 % cream Apply topically as needed for dry skin.  Marland Kitchen atorvastatin (LIPITOR) 10 MG tablet TAKE 1 TABLET BY MOUTH DAILY  . azelastine (ASTELIN) 0.1 % nasal spray USE 2 SPRAYS IN EACH NOSTRIL DAILY AS DIRECTED  . Bacillus Coagulans-Inulin (ALIGN PREBIOTIC-PROBIOTIC PO) Take by mouth every evening.  Marland Kitchen CALCIUM-MAGNESIUM PO Take 1 tablet by mouth 2 (two) times daily.   . cetirizine (ZYRTEC) 10 MG tablet Take 5 mg by mouth every evening.   . cholecalciferol (VITAMIN D) 25 MCG (1000 UT) tablet Take 1,000 Units by mouth daily.   . cyproheptadine (PERIACTIN) 4 MG tablet Take 2 tablets (8 mg total) by mouth every evening.  . denosumab (PROLIA) 60 MG/ML SOLN injection Inject 60 mg into the skin every 6 (six) months. Administer in upper arm, thigh, or abdomen  . dexlansoprazole (DEXILANT) 60 MG capsule TAKE 1 CAPSULE BY MOUTH EACH MORNING  . diclofenac Sodium (VOLTAREN) 1 % GEL APPLY 2 GRAMS TOPICALLY TWICE DAILY  . EPINEPHrine (EPIPEN 2-PAK) 0.3 mg/0.3 mL IJ SOAJ injection Inject 0.3 mLs (0.3 mg total) into the muscle Once PRN.  . fluticasone (FLONASE) 50 MCG/ACT nasal spray Place 2 sprays into both nostrils daily.  . furosemide (LASIX) 40 MG tablet TAKE 1 TABLET BY MOUTH DAILY AS NEEDED  . gabapentin (NEURONTIN) 600 MG tablet Take 1 tablet (600 mg total) by mouth daily.   Marland Kitchen guaiFENesin (MUCINEX) 600 MG 12 hr tablet Take by mouth 2 (two) times daily.  . Lactase (LACTAID FAST ACT) 9000 units TABS Take 18,000 Units by mouth 4 (four) times daily as needed (dairy consumption).   Marland Kitchen lidocaine (LIDODERM) 5 % Place 1 patch onto the skin daily as needed (pain). Remove & Discard patch within 12 hours or as directed by MD  . mirtazapine (REMERON SOL-TAB) 30 MG disintegrating tablet DISSOLVE 1 TABLET BY MOUTH EVERY NIGHT AT BEDTIME  . mometasone (NASONEX) 50 MCG/ACT nasal spray Place 2 sprays into the nose daily.  . mometasone-formoterol (DULERA) 200-5 MCG/ACT AERO Inhale 2 puffs into the lungs 2 (two) times daily.  . montelukast (SINGULAIR) 10 MG tablet TAKE 1 TABLET BY MOUTH DAILY  . mupirocin ointment (BACTROBAN) 2 % Apply 1 application topically 2 (two) times daily.  Marland Kitchen nystatin (MYCOSTATIN) 100000 UNIT/ML suspension SWISH AND SPIT 5 MLS TWICE DAILY  . nystatin-triamcinolone ointment (MYCOLOG) Apply topically 2 (two) times daily.  . predniSONE (DELTASONE)  10 MG tablet Take 10 mg by mouth daily with breakfast.  . Prenatal Vit-Fe Fumarate-FA (PRENATAL MULTIVITAMIN) TABS tablet Take 1 tablet by mouth daily at 12 noon.  Marland Kitchen Propylene Glycol (SYSTANE COMPLETE) 0.6 % SOLN Apply to eye.  Marland Kitchen Respiratory Therapy Supplies (FLUTTER) DEVI Use as directed  . rivaroxaban (XARELTO) 20 MG TABS tablet TAKE 1 TABLET BY MOUTH DAILY WITH SUPPER  . sodium chloride HYPERTONIC 3 % nebulizer solution Take by nebulization 2 (two) times daily.  Marland Kitchen Spacer/Aero-Holding Chambers (AEROCHAMBER PLUS) inhaler Use as instructed  . sulfamethoxazole-trimethoprim (BACTRIM DS) 800-160 MG tablet Take 1 tablet by mouth 2 (two) times daily.  . Tiotropium Bromide Monohydrate (SPIRIVA RESPIMAT) 2.5 MCG/ACT AERS Inhale 1 puff into the lungs daily.   Current Facility-Administered Medications for the 12/24/19 encounter (Office Visit) with Gatha Mayer, MD  Medication  . denosumab (PROLIA) injection 60 mg   Past  Medical History:  Diagnosis Date  . Acute deep vein thrombosis (DVT) of right lower extremity ([place]) 12/13/2017  . Allergic rhinitis   . Allergy    SEASONAL  . Anemia   . Anxiety    pt denies  . Arthritis    Phreesia 10/21/2019  . Asthma   . Asthma    Phreesia 10/21/2019  . Breast cancer ([place]) 1998   in remission  . Cataract    REMOVED  . Clostridium difficile colitis 01/14/2018  . Clostridium difficile diarrhea 01/14/2018  . Complication of anesthesia    "had hard time waking up from it several times" (02/20/2012)  . Depression    "some; don't take anything for it" (02/20/2012)  . Diverticulosis   . DVT (deep venous thrombosis) ([place])   . Exertional dyspnea   . Fibromyalgia 11/2011  . GERD (gastroesophageal reflux disease)   . Graves disease   . Headache(784.0)    "related to allergies; more at different times during the year" (02/20/2012)  . Hemorrhoids   . Hiatal hernia    back and neck  . Hx of adenomatous colonic polyps 04/12/2016  . Hypercholesteremia    good cholesterol is high  . Hypothyroidism   . IBS (irritable bowel syndrome)   . Moderate persistent asthma    -FeV1 72% 2011, -IgE 102 2011, CT sinus Neg 2011  . Osteoporosis    on reclast yearly  . Pneumonia 04/2011; ~ 11/2011   "double; single" (02/20/2012)  . Seronegative rheumatoid arthritis ([place])    Dr. Lahoma Rocker  . SIRS (systemic inflammatory response syndrome) ([place]) 02/10/2018  . Tracheobronchomalacia    Past Surgical History:  Procedure Laterality Date  . ABDOMINAL HYSTERECTOMY N/A    Phreesia 10/21/2019  . ANTERIOR AND POSTERIOR REPAIR  1990's  . APPENDECTOMY    . BREAST LUMPECTOMY  1998   left  . BREAST SURGERY N/A    Phreesia 10/21/2019  . CARPOMETACARPEL ([place]) FUSION OF THUMB WITH AUTOGRAFT FROM RADIUS  ~ 2009   "both thumbs" (02/20/2012)  . CATARACT EXTRACTION W/ INTRAOCULAR LENS  IMPLANT, BILATERAL  2012  . CERVICAL DISCECTOMY  10/2001   C5-C6  . CERVICAL FUSION  2003   C3-C4  .  CHOLECYSTECTOMY    . COLONOSCOPY    . DEBRIDEMENT TENNIS ELBOW  ?1970's   right  . ESOPHAGOGASTRODUODENOSCOPY    . EYE SURGERY N/A    Phreesia 10/21/2019  . HYSTERECTOMY    . KNEE ARTHROPLASTY  ?1990's   "?right; w/cartilage repair" (02/20/2012)  . NASAL SEPTUM SURGERY  1980's  . POSTERIOR CERVICAL FUSION/FORAMINOTOMY  2004   "failed initial fusion; rewired  anterior neck" (02/20/2012)  . SPINE SURGERY N/A    Phreesia 10/21/2019  . TONSILLECTOMY  ~ 1953  . VESICOVAGINAL FISTULA CLOSURE W/ TAH  1988  . VIDEO BRONCHOSCOPY Bilateral 08/23/2016   Procedure: VIDEO BRONCHOSCOPY WITH FLUORO;  Surgeon: Javier Glazier, MD;  Location: [facility];  Service: Cardiopulmonary;  Laterality: Bilateral;   Social History   Social History Narrative   Patient lives at home alone. Patient  divorced.    Patient has her BS degree.   Right handed.   Caffeine- sometimes coffee.      [facility]:   Born in [place], [place]. She worked as a Copywriter, advertising. She has no pets currently. She does have indoor plants. Previously had mold in her home that was remediated. Carpet was removed.          family history includes Allergies in her father and mother; Arthritis in her father and mother; Colitis in her daughter; Colon cancer in an other family member; Diabetes in her maternal grandfather and mother; Heart disease in her father and mother; Lung cancer in her mother; Stroke in her father.   Review of Systems As per HPI  Objective:   Physical Exam BP 124/70   Pulse 82   Ht 5\' 1"  (1.549 m)   Wt 137 lb (62.1 kg)   BMI 25.89 kg/m  No acute distress well-developed well-nourished white woman  Appropriate mood and affect Alert and oriented x3

## 2019-12-25 NOTE — Progress Notes (Signed)
Her latest sputum culture did not grow anything, so thankfully her short course of antibiotics was effective to get rid of her infection.   Julian Hy, DO 12/25/19 12:37 PM [facility]

## 2019-12-28 ENCOUNTER — Telehealth: Payer: Self-pay | Admitting: Critical Care Medicine

## 2019-12-28 DIAGNOSIS — J471 Bronchiectasis with (acute) exacerbation: Secondary | ICD-10-CM | POA: Diagnosis not present

## 2019-12-28 NOTE — Telephone Encounter (Signed)
Ebony Hy, DO  12/28/2019 2:11 PM EST     Please let Ebony Scott know that her respiratory culture only grew a contaminant and is otherwise negative. Thanks!  Ebony Hy, DO 12/28/19 2:11 PM [facility]   Called and spoke with pt letting her know the results of the sputum culture and she verbalized understanding. Nothing further needed.

## 2019-12-28 NOTE — Progress Notes (Signed)
Please let Ms. Diffee know that her respiratory culture only grew a contaminant and is otherwise negative. Thanks!  Julian Hy, DO 12/28/19 2:11 PM [facility]

## 2019-12-30 DIAGNOSIS — G4733 Obstructive sleep apnea (adult) (pediatric): Secondary | ICD-10-CM | POA: Diagnosis not present

## 2020-01-04 ENCOUNTER — Telehealth: Payer: Self-pay | Admitting: Family Medicine

## 2020-01-04 ENCOUNTER — Other Ambulatory Visit: Payer: Self-pay

## 2020-01-04 ENCOUNTER — Encounter: Payer: Self-pay | Admitting: Cardiology

## 2020-01-04 ENCOUNTER — Ambulatory Visit: Payer: Medicare PPO | Admitting: Cardiology

## 2020-01-04 VITALS — BP 150/72 | HR 75 | Ht 61.0 in | Wt 137.8 lb

## 2020-01-04 DIAGNOSIS — I2510 Atherosclerotic heart disease of native coronary artery without angina pectoris: Secondary | ICD-10-CM

## 2020-01-04 DIAGNOSIS — I5032 Chronic diastolic (congestive) heart failure: Secondary | ICD-10-CM | POA: Diagnosis not present

## 2020-01-04 DIAGNOSIS — R0602 Shortness of breath: Secondary | ICD-10-CM

## 2020-01-04 DIAGNOSIS — R600 Localized edema: Secondary | ICD-10-CM

## 2020-01-04 DIAGNOSIS — I2584 Coronary atherosclerosis due to calcified coronary lesion: Secondary | ICD-10-CM

## 2020-01-04 DIAGNOSIS — E782 Mixed hyperlipidemia: Secondary | ICD-10-CM | POA: Diagnosis not present

## 2020-01-04 NOTE — Progress Notes (Signed)
Cardiology Office Note:    Date:  01/04/2020   ID:  Ebony Scott, DOB [birth year], MRN [id number]  PCP:  [name], MD  Cardiologist:  Buford Dresser, MD PhD  Referring MD: [name], MD   CC: follow up  History of Present Illness:    Ebony Scott is a 69 y.o. female with a hx of asthma and other medical conditions listed below who is followed for incidental coronary artery calcification seen on CT scan. Initial consult was 10/15/17.  Cardiac history: had CT chest 07/2017 which noted diffuse calcification, including aorta, aortic branch vessels, and coronary arteries. Had a normal Lexiscan in the past. No chest pain, main longstanding symptom is shortness of breath, followed by pulmonary. No history of clinical ASCVD events. History of DVT/PE.  Today: Has been struggling with her breathing for the last several months. Has been on and off antibiotics for that time. Rheumatoid arthritis has been very severe, starting new RA infusion in two days. Has been on steroids chronically. Struggling with thrush as a side effects. Brings weight log, has been gradually downtrending.   Taking lasix BID, not yet this AM. Takes 40 in the AM and 20 in the PM. No issues with swelling. Follows with Dr. Clover Mealy at [facility], has had two recent iron infusions.   In pain from her shoulder today, pending MRI for this.   Reviewed PFTs from 08/2019: Good patient effort. The results of this test meets ATS standards for acceptability and repeatability. Hgb default value of 13.4 used.  Although the FEV1 and FVC are reduced, the FEV1/FVC ratio is increased. The airway resistance is normal. TLC is normal. Following administration of bronchodilators, there is no significant response. The reduced diffusing capacity indicates a mild loss of functional alveolar capillary surface. Conclusions: The diffusion defect, increased FEV1/FVC ratio and reduced FVC suggest an early interstitial  process.  Pulmonary Function Diagnosis: Mild Diffusion Defect  Denies chest pain. No PND, orthopnea, LE edema or unexpected weight gain. No syncope or palpitations.  Past Medical History:  Diagnosis Date  . Acute deep vein thrombosis (DVT) of right lower extremity ([place]) 12/13/2017  . Allergic rhinitis   . Allergy    SEASONAL  . Anemia   . Anxiety    pt denies  . Arthritis    Phreesia 10/21/2019  . Asthma   . Asthma    Phreesia 10/21/2019  . Breast cancer ([place]) 1998   in remission  . Cataract    REMOVED  . Clostridium difficile colitis 01/14/2018  . Clostridium difficile diarrhea 01/14/2018  . Complication of anesthesia    "had hard time waking up from it several times" (02/20/2012)  . Depression    "some; don't take anything for it" (02/20/2012)  . Diverticulosis   . DVT (deep venous thrombosis) ([place])   . Exertional dyspnea   . Fibromyalgia 11/2011  . GERD (gastroesophageal reflux disease)   . Graves disease   . Headache(784.0)    "related to allergies; more at different times during the year" (02/20/2012)  . Hemorrhoids   . Hiatal hernia    back and neck  . Hx of adenomatous colonic polyps 04/12/2016  . Hypercholesteremia    good cholesterol is high  . Hypothyroidism   . IBS (irritable bowel syndrome)   . Moderate persistent asthma    -FeV1 72% 2011, -IgE 102 2011, CT sinus Neg 2011  . Osteoporosis    on reclast yearly  . Pneumonia 04/2011; ~ 11/2011   "  double; single" (02/20/2012)  . Seronegative rheumatoid arthritis ([place])    Dr. Lahoma Rocker  . SIRS (systemic inflammatory response syndrome) ([place]) 02/10/2018  . Tracheobronchomalacia     Past Surgical History:  Procedure Laterality Date  . ABDOMINAL HYSTERECTOMY N/A    Phreesia 10/21/2019  . ANTERIOR AND POSTERIOR REPAIR  1990's  . APPENDECTOMY    . BREAST LUMPECTOMY  1998   left  . BREAST SURGERY N/A    Phreesia 10/21/2019  . CARPOMETACARPEL ([employer]) FUSION OF THUMB WITH AUTOGRAFT FROM RADIUS  ~ 2009    "both thumbs" (02/20/2012)  . CATARACT EXTRACTION W/ INTRAOCULAR LENS  IMPLANT, BILATERAL  2012  . CERVICAL DISCECTOMY  10/2001   C5-C6  . CERVICAL FUSION  2003   C3-C4  . CHOLECYSTECTOMY    . COLONOSCOPY    . DEBRIDEMENT TENNIS ELBOW  ?1970's   right  . ESOPHAGOGASTRODUODENOSCOPY    . EYE SURGERY N/A    Phreesia 10/21/2019  . HYSTERECTOMY    . KNEE ARTHROPLASTY  ?1990's   "?right; w/cartilage repair" (02/20/2012)  . NASAL SEPTUM SURGERY  1980's  . POSTERIOR CERVICAL FUSION/FORAMINOTOMY  2004   "failed initial fusion; rewired  anterior neck" (02/20/2012)  . SPINE SURGERY N/A    Phreesia 10/21/2019  . TONSILLECTOMY  ~ 1953  . VESICOVAGINAL FISTULA CLOSURE W/ TAH  1988  . VIDEO BRONCHOSCOPY Bilateral 08/23/2016   Procedure: VIDEO BRONCHOSCOPY WITH FLUORO;  Surgeon: Javier Glazier, MD;  Location: [facility];  Service: Cardiopulmonary;  Laterality: Bilateral;    Current Medications: Current Outpatient Medications on File Prior to Visit  Medication Sig  . acetaminophen (TYLENOL) 650 MG CR tablet Take 1,300 mg by mouth every 8 (eight) hours as needed for pain.  Marland Kitchen albuterol (PROAIR HFA) 108 (90 Base) MCG/ACT inhaler Inhale 2 puffs into the lungs every 4 (four) hours as needed for wheezing or shortness of breath.  . allopurinol (ZYLOPRIM) 100 MG tablet   . Alpha-D-Galactosidase (BEANO PO) Take 1 capsule by mouth 3 (three) times daily as needed (gas/ bloating).   . Alpha-Lipoic Acid 600 MG CAPS Take 600 mg by mouth daily.   Marland Kitchen alum & mag hydroxide-simeth (MAALOX/MYLANTA) 200-200-20 MG/5ML suspension Take 15 mLs by mouth every 4 (four) hours as needed for indigestion or heartburn.  Marland Kitchen ammonium lactate (AMLACTIN) 12 % cream Apply topically as needed for dry skin.  Marland Kitchen atorvastatin (LIPITOR) 10 MG tablet TAKE 1 TABLET BY MOUTH DAILY  . azelastine (ASTELIN) 0.1 % nasal spray USE 2 SPRAYS IN EACH NOSTRIL DAILY AS DIRECTED  . Bacillus Coagulans-Inulin (ALIGN PREBIOTIC-PROBIOTIC PO) Take by mouth  every evening.  Marland Kitchen CALCIUM-MAGNESIUM PO Take 1 tablet by mouth 2 (two) times daily.   . cetirizine (ZYRTEC) 10 MG tablet Take 5 mg by mouth every evening.   . cholecalciferol (VITAMIN D) 25 MCG (1000 UT) tablet Take 1,000 Units by mouth daily.   . cyproheptadine (PERIACTIN) 4 MG tablet Take 2 tablets (8 mg total) by mouth every evening.  . denosumab (PROLIA) 60 MG/ML SOLN injection Inject 60 mg into the skin every 6 (six) months. Administer in upper arm, thigh, or abdomen  . dexlansoprazole (DEXILANT) 60 MG capsule TAKE 1 CAPSULE BY MOUTH EACH MORNING  . diclofenac Sodium (VOLTAREN) 1 % GEL APPLY 2 GRAMS TOPICALLY TWICE DAILY (Patient taking differently: Pt applies four times daily)  . EPINEPHrine (EPIPEN 2-PAK) 0.3 mg/0.3 mL IJ SOAJ injection Inject 0.3 mLs (0.3 mg total) into the muscle Once PRN.  . fluticasone (FLONASE) 50  MCG/ACT nasal spray Place 2 sprays into both nostrils daily.  . furosemide (LASIX) 40 MG tablet TAKE 1 TABLET BY MOUTH DAILY AS NEEDED (Patient taking differently: Pt takes 60 mg daily)  . gabapentin (NEURONTIN) 600 MG tablet Take 1 tablet (600 mg total) by mouth daily.  Marland Kitchen guaiFENesin (MUCINEX) 600 MG 12 hr tablet Take by mouth 2 (two) times daily.  . Lactase (LACTAID FAST ACT) 9000 units TABS Take 18,000 Units by mouth 4 (four) times daily as needed (dairy consumption).   Marland Kitchen lidocaine (LIDODERM) 5 % Place 1 patch onto the skin daily as needed (pain). Remove & Discard patch within 12 hours or as directed by MD  . mirtazapine (REMERON SOL-TAB) 30 MG disintegrating tablet DISSOLVE 1 TABLET BY MOUTH EVERY NIGHT AT BEDTIME  . mometasone (NASONEX) 50 MCG/ACT nasal spray Place 2 sprays into the nose daily.  . mometasone-formoterol (DULERA) 200-5 MCG/ACT AERO Inhale 2 puffs into the lungs 2 (two) times daily.  . montelukast (SINGULAIR) 10 MG tablet TAKE 1 TABLET BY MOUTH DAILY  . mupirocin ointment (BACTROBAN) 2 % Apply 1 application topically 2 (two) times daily.  Marland Kitchen nystatin  (MYCOSTATIN) 100000 UNIT/ML suspension SWISH AND SPIT 5 MLS TWICE DAILY  . nystatin-triamcinolone ointment (MYCOLOG) Apply topically 2 (two) times daily. (Patient taking differently: Apply topically 2 (two) times daily. Pt uses as needed)  . predniSONE (DELTASONE) 10 MG tablet Take 10 mg by mouth daily with breakfast.  . Prenatal Vit-Fe Fumarate-FA (PRENATAL MULTIVITAMIN) TABS tablet Take 1 tablet by mouth daily at 12 noon.  Marland Kitchen Propylene Glycol (SYSTANE COMPLETE) 0.6 % SOLN Apply to eye.  Marland Kitchen Respiratory Therapy Supplies (FLUTTER) DEVI Use as directed  . rifaximin (XIFAXAN) 550 MG TABS tablet Take 1 tablet (550 mg total) by mouth 3 (three) times daily.  . rivaroxaban (XARELTO) 20 MG TABS tablet TAKE 1 TABLET BY MOUTH DAILY WITH SUPPER  . sodium chloride HYPERTONIC 3 % nebulizer solution Take by nebulization 2 (two) times daily.  Marland Kitchen Spacer/Aero-Holding Chambers (AEROCHAMBER PLUS) inhaler Use as instructed  . Tiotropium Bromide Monohydrate (SPIRIVA RESPIMAT) 2.5 MCG/ACT AERS Inhale 1 puff into the lungs daily.  Derrill Memo ON 01/06/2020] Tocilizumab (ACTEMRA IV) Inject into the vein.  Marland Kitchen sulfamethoxazole-trimethoprim (BACTRIM DS) 800-160 MG tablet Take 1 tablet by mouth 2 (two) times daily.   Current Facility-Administered Medications on File Prior to Visit  Medication  . denosumab (PROLIA) injection 60 mg     Allergies:   Dust mite extract, Molds & smuts, Morphine and related, Morphine sulfate, Other, Penicillins, Reclast [zoledronic acid], Rofecoxib, Shellfish allergy, Shrimp flavor, Tetracycline hcl, Xolair [omalizumab], Dilaudid [hydromorphone hcl], Hydrocodone-acetaminophen, Levofloxacin, Oxycodone hcl, Paroxetine, Celecoxib, Diltiazem, Lactose intolerance (gi), and Tree extract   Social History   Tobacco Use  . Smoking status: Passive Smoke Exposure - Never Smoker  . Smokeless tobacco: Never Used  . Tobacco comment: Parents  Vaping Use  . Vaping Use: Never used  Substance Use Topics  .  Alcohol use: Not Currently    Alcohol/week: 0.0 standard drinks  . Drug use: No    Family History: The patient's family history includes Allergies in her father and mother; Arthritis in her father and mother; Colitis in her daughter; Colon cancer in an other family member; Diabetes in her maternal grandfather and mother; Heart disease in her father and mother; Lung cancer in her mother; Stroke in her father. Father had 4V CABG and valve replacement at age 71; had a complication with ruptured aorta after surgery and passed  away. Mother had lung cancer and diabetes. No other significant heart disease or risks.   ROS:   Please see the history of present illness.  Additional pertinent ROS otherwise unremarkable.  EKGs/Labs/Other Studies Reviewed:    The following studies were reviewed today: Echo 07/10/19 1. Hyperdynamic LV function; grade 1 diastolic dysfunction; moderate LVH;  trace AI.  2. Left ventricular ejection fraction, by estimation, is >75%. The left  ventricle has hyperdynamic function. The left ventricle has no regional  wall motion abnormalities. There is moderate left ventricular hypertrophy.  Left ventricular diastolic  parameters are consistent with Grade I diastolic dysfunction (impaired  relaxation).  3. Right ventricular systolic function is normal. The right ventricular  size is normal.  4. The mitral valve is normal in structure. No evidence of mitral valve  regurgitation. No evidence of mitral stenosis.  5. The aortic valve has an indeterminant number of cusps. Aortic valve  regurgitation is trivial. No aortic stenosis is present.  Echo 08/05/17 Study Conclusions  - Left ventricle: The cavity size was normal. There was mild focal   basal hypertrophy of the septum. Systolic function was vigorous.   The estimated ejection fraction was in the range of 65% to 70%.   Wall motion was normal; there were no regional wall motion   abnormalities. Doppler parameters are  consistent with abnormal   left ventricular relaxation (grade 1 diastolic dysfunction).  Impressions:  - Definity used; vigorous LV systolic function; mild diastolic   dysfunction.  CT chest high resolution 6.17.19 FINDINGS: Cardiovascular: Heart size is normal. There is no significant pericardial fluid, thickening or pericardial calcification. There is aortic atherosclerosis, as well as atherosclerosis of the great vessels of the mediastinum and the coronary arteries, including calcified atherosclerotic plaque in the left main, left anterior descending left circumflex coronary arteries.  IMPRESSION: 1. No findings to suggest interstitial lung disease. 2. Mild air trapping indicative of mild small airways disease. 3. Aortic atherosclerosis, in addition to left main and 2 vessel coronary artery disease. Please note that although the presence of coronary artery calcium documents the presence of coronary artery disease, the severity of this disease and any potential stenosis cannot be assessed on this non-gated CT examination. Assessment for potential risk factor modification, dietary therapy or pharmacologic therapy may be warranted, if clinically indicated. 4. Additional incidental findings, as above.  Stress 2011:  Stress echo 11/2009 Study Conclusions - Stress ECG conclusions: There were no stress arrhythmias or conduction abnormalities. The stress ECG was normal. - Staged echo: There was no echocardiographic evidence for stress-induced ischemia. Impressions: - Nondiagnostic stress echocardiogram with nochest pain, no ST changes; patient did not achieve target heart rate; no stress-induced wall motion abnormalities.  Had [place] 12/2009 due to nondiagnostic stress echo: Stress Procedure  The patient received IV Lexiscan 0.4 mg over 15-seconds with concurrent low level exercise and then Technetium 44m Tetrofosmin was injected at 30-seconds while the patient continued  walking one more minute.  There were no significant changes with Lexiscan.  Quantitative spect images were obtained after a 45 minute delay.  QPS Raw Data Images:  Normal; no motion artifact; normal heart/lung ratio. Stress Images:  Normal homogeneous uptake in all areas of the myocardium. Rest Images:  Normal homogeneous uptake in all areas of the myocardium. Subtraction (SDS):  No evidence of ischemia. Transient Ischemic Dilatation:  1.15  (Normal <1.22)  Lung/Heart Ratio:  .27  (Normal <0.45)  Quantitative Gated Spect Images QGS EDV:  52 ml QGS ESV:  13  ml QGS EF:  75 % QGS cine images:  Normal motion  Findings Normal nuclear study  EKG:  EKG is personally reviewed today.  The ekg ordered 02/26/19 demonstrates normal sinus rhythm, lack of anterior forces V1/V2  Recent Labs: 05/07/2019: Brain Natriuretic Peptide 41 09/04/2019: ALT 64; Hemoglobin 11.0; Platelets 247 12/10/2019: BUN 47; Creatinine, Ser 1.73; Potassium 3.5; Sodium 137  Recent Lipid Panel    Component Value Date/Time   CHOL 192 07/04/2018 1017   TRIG 212 (H) 07/04/2018 1017   HDL 84 07/04/2018 1017   CHOLHDL 2.3 07/04/2018 1017   VLDL 27 08/20/2016 1103   LDLCALC 77 07/04/2018 1017    Physical Exam:    VS:  BP (!) 150/72 (BP Location: Right Arm, Patient Position: Sitting)   Pulse 75   Ht 5\' 1"  (1.549 m)   Wt 137 lb 12.8 oz (62.5 kg)   SpO2 95%   BMI 26.04 kg/m     Wt Readings from Last 3 Encounters:  01/04/20 137 lb 12.8 oz (62.5 kg)  12/24/19 137 lb (62.1 kg)  12/17/19 141 lb (64 kg)    GEN: Well nourished, well developed in no acute distress HEENT: Normal, moist mucous membranes NECK: No JVD CARDIAC: regular rhythm, normal S1 and S2, no rubs or gallops. No murmur. VASCULAR: Radial and DP pulses 2+ bilaterally. No carotid bruits RESPIRATORY:  Clear to auscultation without rales, wheezing or rhonchi  ABDOMEN: Soft, non-tender, non-distended MUSCULOSKELETAL:  Ambulates independently SKIN: Warm  and dry, trivial bilateral LE edema with prominent ankle bursa NEUROLOGIC:  Alert and oriented x 3. No focal neuro deficits noted. PSYCHIATRIC:  Normal affect   ASSESSMENT:    1. SOB (shortness of breath)   2. Chronic diastolic CHF (congestive heart failure) ([place])   3. Bilateral leg edema   4. Coronary artery calcification seen on CT scan   5. Mixed hyperlipidemia   6. Coronary artery disease due to calcified coronary lesion    PLAN:    Bilateral LE edema, shortness of breath, chronic diastolic heart failure -weight down since last visit -improve LE edema since last visit -echo 06/2019 with hyperdynamic EF and only grade 1 diastolic dysfunction -is undergoing pulmonary eval as noted -recent BNP normal -counseled on heart failure education  Coronary calcium seen on CT scan: suggests CAD equivalent -asymptomatic -prior negative nuclear stress, echo without WMA -counseled on prevention, below  Hypercholesterolemia with goal LDL <70, hypertriglyceridemia (unknown if fasting) -lipids from 07/04/2018 [facility] reviewed -continue atorvastatin -LFTs normal 12/2018 per [facility]  Secondary prevention: -recommend heart healthy/Mediterranean diet, with whole grains, fruits, vegetable, fish, lean meats, nuts, and olive oil. Limit salt. -recommend moderate walking, 3-5 times/week for 30-50 minutes each session. Aim for at least 150 minutes.week. Goal should be pace of 3 miles/hours, or walking 1.5 miles in 30 minutes -recommend avoidance of tobacco products. Avoid excess alcohol. -Additional risk factor control:  -Diabetes: no history  -Lipids: as above  -Blood pressure control: at goal  -Weight: BMI 26 -no aspirin given plans for long term anticoagulation given prior DVT/PE  Plan for follow up: 6 mos or sooner based on symptoms, if echo unremarkable  Buford Dresser, MD, PhD [place]  [facility]   Medication Adjustments/Labs and Tests Ordered: Current medicines are reviewed at  length with the patient today.  Concerns regarding medicines are outlined above.  No orders of the defined types were placed in this encounter.  No orders of the defined types were placed in this encounter.   Patient  Instructions  Medication Instructions:  Your Physician recommend you continue on your current medication as directed.    *If you need a refill on your cardiac medications before your next appointment, please call your pharmacy*   Lab Work: None   Testing/Procedures: None   Follow-Up: At [facility], you and your health needs are our priority.  As part of our continuing mission to provide you with exceptional heart care, we have created designated Provider Care Teams.  These Care Teams include your primary Cardiologist (physician) and Advanced Practice Providers (APPs -  Physician Assistants and Nurse Practitioners) who all work together to provide you with the care you need, when you need it.  We recommend signing up for the patient portal called "MyChart".  Sign up information is provided on this After Visit Summary.  MyChart is used to connect with patients for Virtual Visits (Telemedicine).  Patients are able to view lab/test results, encounter notes, upcoming appointments, etc.  Non-urgent messages can be sent to your provider as well.   To learn more about what you can do with MyChart, go to [email or web address].    Your next appointment:   6 month(s)  The format for your next appointment:   In Person  Provider:   Buford Dresser, MD        Signed, Buford Dresser, MD PhD 01/04/2020   [place]

## 2020-01-04 NOTE — Patient Instructions (Signed)
Medication Instructions:  Your Physician recommend you continue on your current medication as directed.    *If you need a refill on your cardiac medications before your next appointment, please call your pharmacy*   Lab Work: None   Testing/Procedures: None   Follow-Up: At [facility], you and your health needs are our priority.  As part of our continuing mission to provide you with exceptional heart care, we have created designated Provider Care Teams.  These Care Teams include your primary Cardiologist (physician) and Advanced Practice Providers (APPs -  Physician Assistants and Nurse Practitioners) who all work together to provide you with the care you need, when you need it.  We recommend signing up for the patient portal called "MyChart".  Sign up information is provided on this After Visit Summary.  MyChart is used to connect with patients for Virtual Visits (Telemedicine).  Patients are able to view lab/test results, encounter notes, upcoming appointments, etc.  Non-urgent messages can be sent to your provider as well.   To learn more about what you can do with MyChart, go to [email or web address].    Your next appointment:   6 month(s)  The format for your next appointment:   In Person  Provider:   Bridgette Christopher, MD   

## 2020-01-04 NOTE — Telephone Encounter (Signed)
ok 

## 2020-01-04 NOTE — Telephone Encounter (Signed)
Ok to schedule.

## 2020-01-04 NOTE — Telephone Encounter (Signed)
Patient is calling asking if we could refer her to Dr. Mardelle Matte at [facility] for her shoulder pain. She states the MRI isn't scheduled until the 24th of this month and she isn't sure she can wait that long due to pain.  CB# [phone]

## 2020-01-05 NOTE — Telephone Encounter (Signed)
Referral request sent to office manager Eliezer Mccoy to be placed

## 2020-01-06 DIAGNOSIS — M0609 Rheumatoid arthritis without rheumatoid factor, multiple sites: Secondary | ICD-10-CM | POA: Diagnosis not present

## 2020-01-13 ENCOUNTER — Encounter: Payer: Self-pay | Admitting: Family Medicine

## 2020-01-13 ENCOUNTER — Other Ambulatory Visit: Payer: Self-pay

## 2020-01-13 ENCOUNTER — Ambulatory Visit
Admission: RE | Admit: 2020-01-13 | Discharge: 2020-01-13 | Disposition: A | Discharge status: Home / Self Care | Payer: Medicare PPO | Source: Ambulatory Visit | Attending: Family Medicine | Admitting: Family Medicine

## 2020-01-13 DIAGNOSIS — M12811 Other specific arthropathies, not elsewhere classified, right shoulder: Secondary | ICD-10-CM

## 2020-01-13 DIAGNOSIS — M25511 Pain in right shoulder: Secondary | ICD-10-CM | POA: Diagnosis not present

## 2020-01-13 DIAGNOSIS — M75101 Unspecified rotator cuff tear or rupture of right shoulder, not specified as traumatic: Secondary | ICD-10-CM

## 2020-01-15 DIAGNOSIS — J471 Bronchiectasis with (acute) exacerbation: Secondary | ICD-10-CM | POA: Diagnosis not present

## 2020-01-15 DIAGNOSIS — G894 Chronic pain syndrome: Secondary | ICD-10-CM | POA: Diagnosis not present

## 2020-01-15 DIAGNOSIS — G4736 Sleep related hypoventilation in conditions classified elsewhere: Secondary | ICD-10-CM | POA: Diagnosis not present

## 2020-01-15 DIAGNOSIS — G4733 Obstructive sleep apnea (adult) (pediatric): Secondary | ICD-10-CM | POA: Diagnosis not present

## 2020-01-15 DIAGNOSIS — I5032 Chronic diastolic (congestive) heart failure: Secondary | ICD-10-CM | POA: Diagnosis not present

## 2020-01-15 DIAGNOSIS — J45909 Unspecified asthma, uncomplicated: Secondary | ICD-10-CM | POA: Diagnosis not present

## 2020-01-15 DIAGNOSIS — J4540 Moderate persistent asthma, uncomplicated: Secondary | ICD-10-CM | POA: Diagnosis not present

## 2020-01-15 DIAGNOSIS — R0602 Shortness of breath: Secondary | ICD-10-CM | POA: Diagnosis not present

## 2020-01-15 DIAGNOSIS — M0600 Rheumatoid arthritis without rheumatoid factor, unspecified site: Secondary | ICD-10-CM | POA: Diagnosis not present

## 2020-01-18 ENCOUNTER — Other Ambulatory Visit: Payer: Self-pay | Admitting: Podiatry

## 2020-01-18 ENCOUNTER — Ambulatory Visit (INDEPENDENT_AMBULATORY_CARE_PROVIDER_SITE_OTHER): Payer: Medicare PPO

## 2020-01-18 ENCOUNTER — Other Ambulatory Visit: Payer: Self-pay

## 2020-01-18 ENCOUNTER — Ambulatory Visit: Payer: Medicare PPO | Admitting: Podiatry

## 2020-01-18 DIAGNOSIS — S92312G Displaced fracture of first metatarsal bone, left foot, subsequent encounter for fracture with delayed healing: Secondary | ICD-10-CM | POA: Diagnosis not present

## 2020-01-18 DIAGNOSIS — L97511 Non-pressure chronic ulcer of other part of right foot limited to breakdown of skin: Secondary | ICD-10-CM

## 2020-01-18 DIAGNOSIS — S92302D Fracture of unspecified metatarsal bone(s), left foot, subsequent encounter for fracture with routine healing: Secondary | ICD-10-CM | POA: Diagnosis not present

## 2020-01-18 LAB — RESPIRATORY CULTURE OR RESPIRATORY AND SPUTUM CULTURE
MICRO NUMBER:: 11143136
RESULT:: NORMAL
SPECIMEN QUALITY:: ADEQUATE

## 2020-01-18 LAB — FUNGUS CULTURE W SMEAR
MICRO NUMBER:: 11143135
SMEAR:: NONE SEEN
SPECIMEN QUALITY:: ADEQUATE

## 2020-01-20 DIAGNOSIS — S46011A Strain of muscle(s) and tendon(s) of the rotator cuff of right shoulder, initial encounter: Secondary | ICD-10-CM | POA: Diagnosis not present

## 2020-01-20 NOTE — Progress Notes (Signed)
Subjective: 69 year old female presents the office today for follow-up evaluation of wound on the right second toe and for fractures bilaterally.  She states that fracture standpoint she is doing well not have any significant pain.  She said the symptoms are similar when she can.  She wears a surgical shoe and she is on her feet a lot otherwise she is wearing regular shoes.  She still keeps the toe separator in the right second toe.  The wound is about the same.  Denies any drainage or pus or any swelling or redness. Denies any systemic complaints such as fevers, chills, nausea, vomiting. No acute changes since last appointment, and no other complaints at this time.   Objective: AAO x3, NAD DP/PT pulses palpable bilaterally, CRT less than 3 seconds Ulceration of the medial right second toe on the PIPJ although granular wound base is present.  There is no probing vomiting or tunneling to granular wound base.  There is no surrounding erythema, ascending cellulitis.  No fluctuation or crepitation.  There is no malodor.  There is no tenderness palpation to bilateral feet on the fracture sites.  Fluid-filled soft tissue mass in the dorsal aspect of the midfoot on the left side which fluctuates in size.  No significant discomfort in this area today.  No open lesions otherwise No pain with calf compression, swelling, warmth, erythema  Assessment: Ulceration right second toe; metatarsal fractures; soft tissue mass   Plan: -All treatment options discussed with the patient including all alternatives, risks, complications.  -X-rays obtained reviewed.  Fracture still middle fourth and fifth metatarsals on the left side there is callus formation.  Nonunion is present. -Today I sharply debrided the wound of the right second toe any complications none healthy, viable tissue utilizing 312 with scalpel to remove nonviable tissue to promote wound healing.  Continue with Iodosorb dressing changes daily as well as  offloading. -At this point in the fracture Cerner can be nonunions but they are not symptomatic.  Discussed transition to regular shoe as tolerated.  I would continue with the stimulator.  Trula Slade DPM

## 2020-01-21 ENCOUNTER — Other Ambulatory Visit: Payer: Self-pay | Admitting: Orthopedic Surgery

## 2020-01-21 DIAGNOSIS — M12819 Other specific arthropathies, not elsewhere classified, unspecified shoulder: Secondary | ICD-10-CM

## 2020-01-22 ENCOUNTER — Telehealth: Payer: Self-pay

## 2020-01-22 NOTE — Telephone Encounter (Signed)
[facility] called and said that they will be using general anesthesia on patient

## 2020-01-22 NOTE — Telephone Encounter (Signed)
   [facility] Pre-operative Risk Assessment    Request for surgical clearance:  1. What type of surgery is being performed? RIGHT REVERSE TOTAL SHOULDER REPLACEMENT  2. When is this surgery scheduled? TBD   3. What type of clearance is required (medical clearance vs. Pharmacy clearance to hold med vs. Both)? BOTH  4. Are there any medications that need to be held prior to surgery and how long?  [place]   5. Practice name and name of physician performing surgery? [facility]  ATTN:SHERRI   6. What is the office phone number? [phone]   7.   What is the office fax number? [phone]  8.   Anesthesia type (None, local, MAC, general) ? NOT LISTED-[id number]

## 2020-01-22 NOTE — Telephone Encounter (Signed)
Patient with diagnosis of DVT on Xarelto for anticoagulation.    Procedure: RIGHT REVERSE TOTAL SHOULDER REPLACEMENT Date of procedure: TBD  On Xarelto for multiple DVT. Xarelto managed by PCP. Will defer to Dr. Dennard Schaumann.

## 2020-01-23 NOTE — Telephone Encounter (Signed)
I am not sure if you are the person I need to respond to so I apologize if this should be directed to someone else but I am fine with Ms. Eischeid stopping her xarelto 48 hours prior to surgery and resuming afterward. Please let me know if I need to forward this information to a different individual.  Thanks Gershon Mussel

## 2020-01-23 NOTE — Telephone Encounter (Signed)
I am not sure if you are the person I need to respond to so I apologize if this should be directed to someone else but I am fine with Ms. Folden stopping her xarelto 48 hours prior to surgery and resuming afterward. Please let me know if I need to forward this information to a different individual.  Thanks Gershon Mussel

## 2020-01-25 ENCOUNTER — Telehealth: Payer: Self-pay | Admitting: Critical Care Medicine

## 2020-01-25 NOTE — Telephone Encounter (Signed)
Follow up:   Patient calling to check the status of her medical clearance. Please call patient. 

## 2020-01-25 NOTE — Telephone Encounter (Signed)
Hi Dr. Harrell Gave. Ebony Scott has upcoming shoulder replacement planned (date TBD). It looks likes you recently saw her on 01/04/2020 at which time it looks like she reported problems with her breathing over the last several months and had been on and off antibiotics for this. Do you feel like she is stable from a cardiac standpoint to proceed with surgery?  Please route response back to P CV DIV PREOP.  Thank you! Subrina Vecchiarelli

## 2020-01-25 NOTE — Telephone Encounter (Signed)
Spoke with Ebony Scott, aware it looks like they are waiting for response from dr Harrell Gave. Aware will forward to dr Harrell Gave to review

## 2020-01-25 NOTE — Telephone Encounter (Signed)
I will not be in the office until next week. When is surgery planned and what surgery is this for? If we don't know until her appointment that is fine, but will help risk-stratify her. Thanks!  LPC

## 2020-01-25 NOTE — Telephone Encounter (Signed)
01/24/2030  Contacted patient.  Got patient scheduled for follow-up for surgical clearance on 01/26/2020 with TP NP.  We will route to Dr. Carlis Abbott as Juluis Rainier.  Explained to patient that after visit tomorrow TP NP will likely get the patient set up with a new pulmonologist as Dr. Carlis Abbott is working full-time in the inpatient setting.  Patient reports that paperwork should be getting faxed to Dr. Ainsley Spinner in [place].  Regarding surgical clearance from Dr. Mardelle Matte.  Nothing further needed at this time.  Wyn Quaker, FNP

## 2020-01-25 NOTE — Telephone Encounter (Signed)
Pt scheduled in office with TP NP tomm for surgical clearance.   Ebony Scott

## 2020-01-26 ENCOUNTER — Encounter: Payer: Self-pay | Admitting: Adult Health

## 2020-01-26 ENCOUNTER — Other Ambulatory Visit: Payer: Self-pay

## 2020-01-26 ENCOUNTER — Ambulatory Visit: Payer: Medicare PPO | Admitting: Adult Health

## 2020-01-26 DIAGNOSIS — J398 Other specified diseases of upper respiratory tract: Secondary | ICD-10-CM | POA: Diagnosis not present

## 2020-01-26 DIAGNOSIS — J3089 Other allergic rhinitis: Secondary | ICD-10-CM | POA: Diagnosis not present

## 2020-01-26 DIAGNOSIS — J302 Other seasonal allergic rhinitis: Secondary | ICD-10-CM

## 2020-01-26 DIAGNOSIS — J4550 Severe persistent asthma, uncomplicated: Secondary | ICD-10-CM

## 2020-01-26 DIAGNOSIS — Z01818 Encounter for other preprocedural examination: Secondary | ICD-10-CM
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
## 2020-01-26 NOTE — Assessment & Plan Note (Signed)
Asthma currently stable. Pulmonary function testing in July showed moderate restriction with no significant airflow obstruction.  Continue on current regimen.  Continue aggressive pulmonary hygiene

## 2020-01-26 NOTE — Assessment & Plan Note (Signed)
Continue on current maintenance regimen.  Continue on nocturnal CPAP and use CPAP as needed.

## 2020-01-26 NOTE — Patient Instructions (Signed)
Continue Dulera 2 puffs twice daily Continue on Spiriva daily Albuterol as needed Continue on CPAP at bedtime and as needed Continue oxygen at bedtime with CPAP Continue hypertonic nebs twice daily Continue flutter valve Good luck with upcoming surgery Activity as tolerated We will discuss pulmonary rehab when you are recovered from surgery Follow up with Dr. Erin Fulling in 2-3 months and As needed  -30 min slot .

## 2020-01-26 NOTE — Assessment & Plan Note (Addendum)
Pulmonary preop clearance.  Discussed with patient that she has a moderate to high risk due to her multiple comorbidities and age.  We went over her pulmonary risks.  She is approved for surgery from a pulmonary standpoint.  She is on chronic anticoagulation and will have to be held per protocol. She is having procedure done at [facility]. Major Pulmonary risks identified in the multifactorial risk analysis are but not limited to a) pneumonia; b) recurrent intubation risk; c) prolonged or recurrent acute respiratory failure needing mechanical ventilation; d) prolonged hospitalization; e) DVT/Pulmonary embolism; f) Acute Pulmonary edema  Recommend 1. Short duration of surgery as much as possible and avoid paralytic if possible 2. Recovery in step down or ICU with Pulmonary consultation if indicated. 3. DVT prophylaxis as indicated and anticoagulation therapy surgical protocol 4. Aggressive pulmonary toilet with o2, bronchodilatation, and incentive spirometry and early ambulation 5.  CPAP in the postop setting as needed. 6.  O2 in the postop setting as needed to keep O2 saturations greater than 90%.

## 2020-01-26 NOTE — Assessment & Plan Note (Signed)
Continue on maintenance regimen 

## 2020-01-27 DIAGNOSIS — J471 Bronchiectasis with (acute) exacerbation: Secondary | ICD-10-CM | POA: Diagnosis not present

## 2020-01-29 DIAGNOSIS — G4733 Obstructive sleep apnea (adult) (pediatric): Secondary | ICD-10-CM | POA: Diagnosis not present

## 2020-01-29 NOTE — Telephone Encounter (Signed)
Routed to MD/RN/pre-op

## 2020-01-29 NOTE — Progress Notes (Signed)
ARISCAT predicts low to moderate risk for post-op pulmonary complications. Assuming a 2-3 hour procedure, she would have an ARISCAT score of 38 points= 13.3% risk of post-operative pulmonary complication. My fear is that for her this may be an underestimate given how deconditioned she is. She needs NIPPV available after her procedure during recovery. Her anticoagulation should be resumed as soon as it is safe post-operatively. I would recommend inpatient observation post-operatively to ensure pain control and appropriate respiratory support.  Julian Hy, DO 01/29/20 6:14 PM [facility]

## 2020-01-29 NOTE — Telephone Encounter (Signed)
Patient is following up for update. Please call.

## 2020-02-01 ENCOUNTER — Other Ambulatory Visit: Payer: Self-pay | Admitting: Allergy and Immunology

## 2020-02-01 ENCOUNTER — Other Ambulatory Visit: Payer: Self-pay | Admitting: Family Medicine

## 2020-02-01 DIAGNOSIS — J4550 Severe persistent asthma, uncomplicated: Secondary | ICD-10-CM

## 2020-02-01 NOTE — Telephone Encounter (Signed)
Note sent to [facility]. Acceptable risk for surgery

## 2020-02-01 NOTE — Progress Notes (Signed)
Preop recs sent Dr. Thea Alken.

## 2020-02-01 NOTE — Telephone Encounter (Signed)
   Primary Cardiologist: Buford Dresser, MD  Chart reviewed as part of pre-operative protocol coverage. Patient was contacted 02/01/2020 in reference to pre-operative risk assessment for pending surgery as outlined below.  Ebony Scott was last seen on 01/04/20 by Dr. Harrell Gave.  Since that day, Ebony Scott has done well. Per Dr. Harrell Gave, acceptable risk to proceed with surgery.  Therefore, based on ACC/AHA guidelines, the patient would be at acceptable risk for the planned procedure without further cardiovascular testing.   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    Procedure: RIGHT REVERSE TOTAL SHOULDER REPLACEMENT Date of procedure: TBD  On Xarelto for multiple DVT. Xarelto managed by PCP. Will defer to Dr. Dennard Schaumann.   Per Dr. Dennard Schaumann - pt may hold xarelto for 48 hr prior to surgery. Resume as soon as safe to do so per surgeon.   I will route this recommendation to the requesting party via Epic fax function and remove from pre-op pool. Please call with questions.  Tami Lin Ajai Terhaar, PA 02/01/2020, 9:32 AM

## 2020-02-01 NOTE — Telephone Encounter (Signed)
Pt seen on 12.7.21. Will close encounter.

## 2020-02-02 ENCOUNTER — Telehealth: Payer: Self-pay | Admitting: Adult Health

## 2020-02-02 ENCOUNTER — Encounter: Payer: Self-pay | Admitting: Cardiology

## 2020-02-02 ENCOUNTER — Other Ambulatory Visit: Payer: Self-pay | Admitting: Family Medicine

## 2020-02-02 MED ORDER — SODIUM CHLORIDE 3 % IN NEBU: INHALATION_SOLUTION | Freq: Two times a day (BID) | RESPIRATORY_TRACT | 12 refills | Status: DC

## 2020-02-02 NOTE — Telephone Encounter (Signed)
Called and spoke with pt who stated that the sodium chloride sol is out of stock at [facility]. Asked pt if she would like for Rx to be sent to [facility] to see if they are able to get it for her and she stated that would be fine. Rx has been sent to pharmacy for pt. Nothing further needed.

## 2020-02-03 ENCOUNTER — Other Ambulatory Visit: Payer: Self-pay | Admitting: Family Medicine

## 2020-02-03 ENCOUNTER — Telehealth: Payer: Self-pay | Admitting: Adult Health

## 2020-02-03 DIAGNOSIS — J4550 Severe persistent asthma, uncomplicated: Secondary | ICD-10-CM

## 2020-02-03 NOTE — Telephone Encounter (Signed)
Called and spoke with a staff member at [facility], he verified that they are currently unable to get the sodium chloride hypertonic nebulizer solution from either of their suppliers and will not have another update until the end of December.  He said he doubts that other pharmacies could get it either.    I called and spoke with a staff member of  [facility] regarding the nebulizer solution, she is going to have a member of their pharmacy team call us back regarding the availability of the solution.  I called the patient and advised her that we are attempting to find the solution from a DME company.  Advised I would also let her physician know and see if they want to order something different.  She verbalized understanding.

## 2020-02-03 NOTE — Telephone Encounter (Signed)
[facility] pharmacy states they do not have 3% saline is on back order - they are MAYBE getting some in next week but not positive CB# [phone]

## 2020-02-04 ENCOUNTER — Telehealth: Payer: Self-pay | Admitting: Critical Care Medicine

## 2020-02-04 DIAGNOSIS — D631 Anemia in chronic kidney disease: Secondary | ICD-10-CM | POA: Diagnosis not present

## 2020-02-04 DIAGNOSIS — N1832 Chronic kidney disease, stage 3b: Secondary | ICD-10-CM | POA: Diagnosis not present

## 2020-02-04 DIAGNOSIS — M109 Gout, unspecified: Secondary | ICD-10-CM | POA: Diagnosis not present

## 2020-02-04 MED ORDER — ALBUTEROL SULFATE (2.5 MG/3ML) 0.083% IN NEBU: 2.5000 mg | INHALATION_SOLUTION | Freq: Two times a day (BID) | RESPIRATORY_TRACT | 5 refills | Status: DC

## 2020-02-04 NOTE — Telephone Encounter (Signed)
Called and spoke to pharmacy tech at [facility] and was advised the 3% hypertonic solution is also on backorder for at least a month.   Dr. Carlis Abbott please advise the frequency of the 0.083% albuterol neb solution. Thanks.

## 2020-02-04 NOTE — Telephone Encounter (Signed)
Spoke with pt and advised of Dr Anell Barr recommendations from telephone note 02/03/20. Albuterol neb rx sent to [facility]. Pt verbalized understanding and had no further questions.

## 2020-02-04 NOTE — Telephone Encounter (Signed)
lmtcb for pt.  

## 2020-02-04 NOTE — Telephone Encounter (Signed)
I would suggest considering [facility]- they tend to be our most stable supplier. If they don't have it I am guessing that this is going to be a more prolonged problem. If that is the case, can we try switching her to albuterol nebs in place of hypertonic saline?  Julian Hy, DO 02/04/20 8:31 AM [facility]

## 2020-02-04 NOTE — Telephone Encounter (Signed)
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  Julian Hy, DO 02/04/20 11:26 AM [facility]

## 2020-02-05 ENCOUNTER — Encounter: Payer: Self-pay | Admitting: Family Medicine

## 2020-02-05 DIAGNOSIS — M25511 Pain in right shoulder: Secondary | ICD-10-CM | POA: Diagnosis not present

## 2020-02-05 NOTE — Telephone Encounter (Signed)
msg done- see encounter dated 02/04/20

## 2020-02-06 DIAGNOSIS — J471 Bronchiectasis with (acute) exacerbation: Secondary | ICD-10-CM | POA: Diagnosis not present

## 2020-02-06 DIAGNOSIS — G4733 Obstructive sleep apnea (adult) (pediatric): Secondary | ICD-10-CM | POA: Diagnosis not present

## 2020-02-08 ENCOUNTER — Other Ambulatory Visit: Payer: Self-pay

## 2020-02-08 ENCOUNTER — Ambulatory Visit
Admission: RE | Admit: 2020-02-08 | Discharge: 2020-02-08 | Disposition: A | Discharge status: Home / Self Care | Payer: Medicare PPO | Source: Ambulatory Visit | Attending: Orthopedic Surgery | Admitting: Orthopedic Surgery

## 2020-02-08 DIAGNOSIS — Z853 Personal history of malignant neoplasm of breast: Secondary | ICD-10-CM | POA: Diagnosis not present

## 2020-02-08 DIAGNOSIS — M6258 Muscle wasting and atrophy, not elsewhere classified, other site: Secondary | ICD-10-CM | POA: Diagnosis not present

## 2020-02-08 DIAGNOSIS — M25411 Effusion, right shoulder: Secondary | ICD-10-CM | POA: Diagnosis not present

## 2020-02-08 DIAGNOSIS — M12819 Other specific arthropathies, not elsewhere classified, unspecified shoulder: Secondary | ICD-10-CM

## 2020-02-08 DIAGNOSIS — M19011 Primary osteoarthritis, right shoulder: Secondary | ICD-10-CM | POA: Diagnosis not present

## 2020-02-08 NOTE — Patient Instructions (Addendum)
DUE TO COVID-19 ONLY ONE VISITOR IS ALLOWED TO COME WITH YOU AND STAY IN THE WAITING ROOM ONLY DURING PRE OP AND PROCEDURE DAY OF SURGERY. THE 1 VISITOR  MAY VISIT WITH YOU AFTER SURGERY IN YOUR PRIVATE ROOM DURING VISITING HOURS ONLY!   Dr. Mardelle Matte office for shoulder aspiration 02/11/20 at 1:45    YOU NEED TO HAVE A COVID 19 TEST ON__12/27_____ @_1 :45______, THIS TEST MUST BE DONE BEFORE SURGERY,  COVID TESTING SITE [address]  [id number], IT IS ON THE RIGHT GOING OUT [address] [place] APPROXIMATELY  2 MINUTES PAST [employer] ON THE RIGHT. ONCE YOUR COVID TEST IS COMPLETED,  PLEASE BEGIN THE QUARANTINE INSTRUCTIONS AS OUTLINED IN YOUR HANDOUT.                Ebony Scott    Your procedure is scheduled on: 02/16/20   Report to [facility] Main  Entrance   Report to admitting at   7:30 AM     Call this number if you have problems the morning of surgery [phone]   No food after midnight.    You may have clear liquid until 7:00 AM.    At 6:30 AM drink pre surgery drink  . Nothing by mouth after 7:00 AM.  . BRUSH YOUR TEETH MORNING OF SURGERY AND RINSE YOUR MOUTH OUT, NO CHEWING GUM CANDY OR MINTS.     Take these medicines the morning of surgery with A SIP OF WATER: Prednisone, Allopurinol, Dexilant              Use your inhalers and bring them with you.              Bring your mask and tubing                                 You may not have any metal on your body including hair pins and              piercings  Do not wear jewelry, make-up, lotions, powders or perfumes, deodorant             Do not wear nail polish on your fingernails.  Do not shave  48 hours prior to surgery.             Do not bring valuables to the hospital. [place].  Contacts, dentures or bridgework may not be worn into surgery.                Please read over the following fact sheets you were  given: _____________________________________________________________________             [facility]- Preparing for Total Shoulder Arthroplasty    Before surgery, you can play an important role. Because skin is not sterile, your skin needs to be as free of germs as possible. You can reduce the number of germs on your skin by using the following products. . Benzoyl Peroxide Gel o Reduces the number of germs present on the skin o Applied twice a day to shoulder area starting two days before surgery    ==================================================================  Please follow these instructions carefully:  BENZOYL PEROXIDE 5% GEL  Please do not use if you have an allergy to benzoyl peroxide.   If your skin becomes reddened/irritated  stop using the benzoyl peroxide.  Starting two days before surgery, apply as follows: 1. Apply benzoyl peroxide in the morning and at night. Apply after taking a shower. If you are not taking a shower clean entire shoulder front, back, and side along with the armpit with a clean wet washcloth.  2. Place a quarter-sized dollop on your shoulder and rub in thoroughly, making sure to cover the front, back, and side of your shoulder, along with the armpit.   2 days before ____ AM   ____ PM              1 day before ____ AM   ____ PM                         3. Do this twice a day for two days.  (Last application is the night before surgery, AFTER using the CHG soap as described below).  4. Do NOT apply benzoyl peroxide gel on the day of surgery.  [facility] - Preparing for Surgery Before surgery, you can play an important role.   Because skin is not sterile, your skin needs to be as free of germs as possible.   You can reduce the number of germs on your skin by washing with CHG (chlorahexidine gluconate) soap before surgery .  CHG is an antiseptic cleaner which kills germs and bonds with the skin to continue killing germs even after washing. Please DO  NOT use if you have an allergy to CHG or antibacterial soaps.   If your skin becomes reddened/irritated stop using the CHG and inform your nurse when you arrive at Short Stay. Do not shave (including legs and underarms) for at least 48 hours prior to the first CHG shower.   Please follow these instructions carefully:  1.  Shower with CHG Soap the night before surgery and the  morning of Surgery.  2.  If you choose to wash your hair, wash your hair first as usual with your  normal  shampoo.  3.  After you shampoo, rinse your hair and body thoroughly to remove the  shampoo.                                       4.  Use CHG as you would any other liquid soap.  You can apply chg directly  to the skin and wash                       Gently with a scrungie or clean washcloth.  5.  Apply the CHG Soap to your body ONLY FROM THE NECK DOWN.   Do not use on face/ open                           Wound or open sores. Avoid contact with eyes, ears mouth and genitals (private parts).                       Wash face,  Genitals (private parts) with your normal soap.             6.  Wash thoroughly, paying special attention to the area where your surgery  will be performed.  7.  Thoroughly rinse your body with warm water from the neck down.  8.  DO NOT shower/wash with your normal soap after using and rinsing off  the CHG Soap.            9.  Pat yourself dry with a clean towel.            10.  Wear clean pajamas.            11.  Place clean sheets on your bed the night of your first shower and do not  sleep with pets. Day of Surgery : Do not apply any lotions/deodorants the morning of surgery.  Please wear clean clothes to the hospital/surgery center.  FAILURE TO FOLLOW THESE INSTRUCTIONS MAY RESULT IN THE CANCELLATION OF YOUR SURGERY PATIENT SIGNATURE_________________________________  NURSE  SIGNATURE__________________________________  ________________________________________________________________________   Ebony Scott  An incentive spirometer is a tool that can help keep your lungs clear and active. This tool measures how well you are filling your lungs with each breath. Taking long deep breaths may help reverse or decrease the chance of developing breathing (pulmonary) problems (especially infection) following:  A long period of time when you are unable to move or be active. BEFORE THE PROCEDURE   If the spirometer includes an indicator to show your best effort, your nurse or respiratory therapist will set it to a desired goal.  If possible, sit up straight or lean slightly forward. Try not to slouch.  Hold the incentive spirometer in an upright position. INSTRUCTIONS FOR USE  1. Sit on the edge of your bed if possible, or sit up as far as you can in bed or on a chair. 2. Hold the incentive spirometer in an upright position. 3. Breathe out normally. 4. Place the mouthpiece in your mouth and seal your lips tightly around it. 5. Breathe in slowly and as deeply as possible, raising the piston or the ball toward the top of the column. 6. Hold your breath for 3-5 seconds or for as long as possible. Allow the piston or ball to fall to the bottom of the column. 7. Remove the mouthpiece from your mouth and breathe out normally. 8. Rest for a few seconds and repeat Steps 1 through 7 at least 10 times every 1-2 hours when you are awake. Take your time and take a few normal breaths between deep breaths. 9. The spirometer may include an indicator to show your best effort. Use the indicator as a goal to work toward during each repetition. 10. After each set of 10 deep breaths, practice coughing to be sure your lungs are clear. If you have an incision (the cut made at the time of surgery), support your incision when coughing by placing a pillow or rolled up towels firmly  against it. Once you are able to get out of bed, walk around indoors and cough well. You may stop using the incentive spirometer when instructed by your caregiver.  RISKS AND COMPLICATIONS  Take your time so you do not get dizzy or light-headed.  If you are in pain, you may need to take or ask for pain medication before doing incentive spirometry. It is harder to take a deep breath if you are having pain. AFTER USE  Rest and breathe slowly and easily.  It can be helpful to keep track of a log of your progress. Your caregiver can provide you with a simple table to help with this. If you are using the spirometer at home, follow these instructions: [place] IF:   You are having difficultly using the  spirometer.  You have trouble using the spirometer as often as instructed.  Your pain medication is not giving enough relief while using the spirometer.  You develop fever of 100.5 F (38.1 C) or higher. SEEK IMMEDIATE MEDICAL CARE IF:   You cough up bloody sputum that had not been present before.  You develop fever of 102 F (38.9 C) or greater.  You develop worsening pain at or near the incision site. MAKE SURE YOU:   Understand these instructions.  Will watch your condition.  Will get help right away if you are not doing well or get worse. Document Released: 06/18/2006 Document Revised: 04/30/2011 Document Reviewed: 08/19/2006 [facility] Patient Information 2014 [place], [place].   ________________________________________________________________________

## 2020-02-10 ENCOUNTER — Other Ambulatory Visit: Payer: Self-pay

## 2020-02-10 ENCOUNTER — Encounter (HOSPITAL_COMMUNITY)
Admission: RE | Admit: 2020-02-10 | Discharge: 2020-02-10 | Disposition: A | Discharge status: Home / Self Care | Payer: Medicare PPO | Source: Ambulatory Visit | Attending: Orthopedic Surgery | Admitting: Orthopedic Surgery

## 2020-02-10 ENCOUNTER — Encounter (HOSPITAL_COMMUNITY): Payer: Self-pay

## 2020-02-10 DIAGNOSIS — Z01812 Encounter for preprocedural laboratory examination: Secondary | ICD-10-CM | POA: Diagnosis not present

## 2020-02-10 DIAGNOSIS — Z79899 Other long term (current) drug therapy: Secondary | ICD-10-CM | POA: Insufficient documentation

## 2020-02-10 DIAGNOSIS — K219 Gastro-esophageal reflux disease without esophagitis: Secondary | ICD-10-CM | POA: Diagnosis not present

## 2020-02-10 DIAGNOSIS — M19011 Primary osteoarthritis, right shoulder: Secondary | ICD-10-CM | POA: Diagnosis not present

## 2020-02-10 DIAGNOSIS — I82409 Acute embolism and thrombosis of unspecified deep veins of unspecified lower extremity: Secondary | ICD-10-CM | POA: Insufficient documentation

## 2020-02-10 DIAGNOSIS — Z7901 Long term (current) use of anticoagulants: Secondary | ICD-10-CM | POA: Diagnosis not present

## 2020-02-10 DIAGNOSIS — Z7951 Long term (current) use of inhaled steroids: Secondary | ICD-10-CM | POA: Insufficient documentation

## 2020-02-10 DIAGNOSIS — J45909 Unspecified asthma, uncomplicated: Secondary | ICD-10-CM | POA: Insufficient documentation

## 2020-02-10 DIAGNOSIS — G4730 Sleep apnea, unspecified: Secondary | ICD-10-CM | POA: Diagnosis not present

## 2020-02-10 DIAGNOSIS — Z792 Long term (current) use of antibiotics: Secondary | ICD-10-CM | POA: Diagnosis not present

## 2020-02-10 DIAGNOSIS — Z9989 Dependence on other enabling machines and devices: Secondary | ICD-10-CM | POA: Diagnosis not present

## 2020-02-10 DIAGNOSIS — E0500 Thyrotoxicosis with diffuse goiter without thyrotoxic crisis or storm: Secondary | ICD-10-CM | POA: Insufficient documentation

## 2020-02-10 LAB — CBC
HCT: 35.4 % — ABNORMAL LOW (ref 36.0–46.0)
Hemoglobin: 11.3 g/dL — ABNORMAL LOW (ref 12.0–15.0)
MCH: 34.1 pg — ABNORMAL HIGH (ref 26.0–34.0)
MCHC: 31.9 g/dL (ref 30.0–36.0)
MCV: 106.9 fL — ABNORMAL HIGH (ref 80.0–100.0)
Platelets: 169 10*3/uL (ref 150–400)
RBC: 3.31 MIL/uL — ABNORMAL LOW (ref 3.87–5.11)
RDW: 23.1 % — ABNORMAL HIGH (ref 11.5–15.5)
WBC: 7.7 10*3/uL (ref 4.0–10.5)
nRBC: 0.9 % — ABNORMAL HIGH (ref 0.0–0.2)

## 2020-02-10 LAB — SURGICAL PCR SCREEN
MRSA, PCR: NEGATIVE
Staphylococcus aureus: NEGATIVE

## 2020-02-10 NOTE — Progress Notes (Incomplete)
COVID Vaccine Completed:Yes Date COVID Vaccine completed:04/01/19- Booster 12/18/19 COVID vaccine manufacturer: Pfizer     PCP - Dr. Raford Pitcher Rheumatologist- Dr.Aryel Cardiologist - Dr. Venida Jarvis  Chest x-ray - 01/25/20-epic EKG - 02/26/19-epic Stress Test - no ECHO - 07/10/19-epic Cardiac Cath - no Pacemaker/ICD device last checked:NA  Sleep Study - yes CPAP - yes  Fasting Blood Sugar - NA Checks Blood Sugar _____ times a day  Blood Thinner Instructions:Xarelto/ dr. Harrell Gave Aspirin Instructions: stop 3 days prior to DOS/ Dr. Mardelle Matte Last Dose:02/13/20  Anesthesia review:   Patient denies shortness of breath, fever, cough and chest pain at PAT appointment yes   Patient verbalized understanding of instructions that were given to them at the PAT appointment. Patient was also instructed that they will need to review over the PAT instructions again at home before surgery. Yes Pt is SOB with activity and uses many inhalers and a  nebulizer daily. She has a wound on

## 2020-02-11 DIAGNOSIS — M25511 Pain in right shoulder: Secondary | ICD-10-CM | POA: Diagnosis not present

## 2020-02-11 DIAGNOSIS — Z1159 Encounter for screening for other viral diseases: Secondary | ICD-10-CM | POA: Diagnosis not present

## 2020-02-11 NOTE — H&P (Signed)
SHOULDER ARTHROPLASTY ADMISSION H&P  Patient ID: Ebony Scott MRN: [id number] DOB/AGE: January 13, 1951 69 y.o.  Chief Complaint: right shoulder pain.  Planned Procedure Date: 02/16/20 Medical Clearance by Dr. Dennard Schaumann Cardiac Clearance by Dr. Harrell Gave Additional clearance by Dr. Carlis Abbott (Pulmonary), Dr. Moshe Cipro (Nephrology), Dr. Kathlene November (Rheumatology)  HPI: Ebony Scott is a 69 y.o. female who presents for evaluation of djd right shoulder. The patient has a history of pain and functional disability in the right shoulder due to trauma and arthritis and has failed non-surgical conservative treatments for greater than 12 weeks to include NSAID's and/or analgesics, corticosteriod injections and activity modification.  Onset of symptoms was gradual, starting >10 years ago with gradually worsening course since that time. The patient noted no past surgery on the right shoulder.  Patient currently rates pain at 6 out of 10 with activity. Patient has worsening of pain with activity and weight bearing, pain that interferes with activities of daily living and joint swelling.  Patient has evidence of joint space narrowing by imaging studies.  There is no active infection.  Past Medical History:  Diagnosis Date  . Acute deep vein thrombosis (DVT) of right lower extremity ([place]) 12/13/2017  . Allergic rhinitis   . Allergy    SEASONAL  . Anemia   . Anxiety    pt denies  . Arthritis    Phreesia 10/21/2019  . Asthma   . Asthma    Phreesia 10/21/2019  . Breast cancer ([place]) 1998   in remission  . Cataract    REMOVED  . Clostridium difficile colitis 01/14/2018  . Clostridium difficile diarrhea 01/14/2018  . Complication of anesthesia    "had hard time waking up from it several times" (02/20/2012)  . Depression    "some; don't take anything for it" (02/20/2012)  . Diverticulosis   . DVT (deep venous thrombosis) ([place])   . Exertional dyspnea   . Fibromyalgia 11/2011  . GERD (gastroesophageal reflux  disease)   . Graves disease   . Headache(784.0)    "related to allergies; more at different times during the year" (02/20/2012)  . Hemorrhoids   . Hiatal hernia    back and neck  . Hx of adenomatous colonic polyps 04/12/2016  . Hypercholesteremia    good cholesterol is high  . Hypothyroidism   . IBS (irritable bowel syndrome)   . Moderate persistent asthma    -FeV1 72% 2011, -IgE 102 2011, CT sinus Neg 2011  . Osteoporosis    on reclast yearly  . Peripheral vascular disease ([place]) 2019   DVTs  . Pneumonia 04/2011; ~ 11/2011   "double; single" (02/20/2012)  . Seronegative rheumatoid arthritis ([place])    Dr. Lahoma Rocker  . SIRS (systemic inflammatory response syndrome) ([place]) 02/10/2018  . Tracheobronchomalacia    Past Surgical History:  Procedure Laterality Date  . ABDOMINAL HYSTERECTOMY N/A    Phreesia 10/21/2019  . ANTERIOR AND POSTERIOR REPAIR  1990's  . APPENDECTOMY    . BREAST LUMPECTOMY  1998   left  . BREAST SURGERY N/A    Phreesia 10/21/2019  . CARPOMETACARPEL ([place]) FUSION OF THUMB WITH AUTOGRAFT FROM RADIUS  ~ 2009   "both thumbs" (02/20/2012)  . CATARACT EXTRACTION W/ INTRAOCULAR LENS  IMPLANT, BILATERAL  2012  . CERVICAL DISCECTOMY  10/2001   C5-C6  . CERVICAL FUSION  2003   C3-C4  . CHOLECYSTECTOMY    . COLONOSCOPY    . DEBRIDEMENT TENNIS ELBOW  ?1970's   right  . ESOPHAGOGASTRODUODENOSCOPY    .  EYE SURGERY N/A    Phreesia 10/21/2019  . HYSTERECTOMY    . KNEE ARTHROPLASTY  ?1990's   "?right; w/cartilage repair" (02/20/2012)  . NASAL SEPTUM SURGERY  1980's  . POSTERIOR CERVICAL FUSION/FORAMINOTOMY  2004   "failed initial fusion; rewired  anterior neck" (02/20/2012)  . SPINE SURGERY N/A    Phreesia 10/21/2019  . TONSILLECTOMY  ~ 1953  . VESICOVAGINAL FISTULA CLOSURE W/ TAH  1988  . VIDEO BRONCHOSCOPY Bilateral 08/23/2016   Procedure: VIDEO BRONCHOSCOPY WITH FLUORO;  Surgeon: Javier Glazier, MD;  Location: [facility];  Service: Cardiopulmonary;  Laterality:  Bilateral;   Allergies  Allergen Reactions  . Dust Mite Extract Shortness Of Breath and Other (See Comments)    "sneezing" (02/20/2012)  . Molds & Smuts Shortness Of Breath  . Morphine And Related Hives and Itching  . Other Shortness Of Breath and Other (See Comments)    Grass and weeds "sneezing; filled sinuses" (02/20/2012)  . Penicillins Rash and Other (See Comments)    "welts" (02/20/2012) Has patient had a PCN reaction causing immediate rash, facial/tongue/throat swelling, SOB or lightheadedness with hypotension: Unknown Has patient had a PCN reaction causing severe rash involving mucus membranes or skin necrosis: No Has patient had a PCN reaction that required hospitalization: No Has patient had a PCN reaction occurring within the last 10 years: No If all of the above answers are "NO", then may proceed with Cephalosporin use.   Marland Kitchen Reclast [Zoledronic Acid] Other (See Comments)    Fever, Put in hospital, dr said it was a reaction from a reaction   . Rofecoxib Swelling    Vioxx REACTION: feet swelling  . Shellfish Allergy Anaphylaxis, Shortness Of Breath, Itching and Rash  . Tetracycline Hcl Nausea And Vomiting  . Xolair [Omalizumab] Other (See Comments)    Caused Blood clot  . Dilaudid [Hydromorphone Hcl] Itching  . Hydrocodone-Acetaminophen Nausea And Vomiting  . Levofloxacin Other (See Comments)    GI upset  . Oxycodone Hcl Nausea And Vomiting  . Paroxetine Nausea And Vomiting    Paxil   . Celecoxib Swelling    Feet swelling  . Diltiazem Swelling  . Lactose Intolerance (Gi) Other (See Comments)    Bloating and gas  . Tree Extract Other (See Comments)    "tested and told I was allergic to it; never experienced a reaction to it" (02/20/2012)   Prior to Admission medications   Medication Sig Start Date End Date Taking? Authorizing Provider  acetaminophen (TYLENOL) 650 MG CR tablet Take 1,300 mg by mouth 2 (two) times daily.   Yes [provider]  albuterol (PROAIR  HFA) 108 (90 Base) MCG/ACT inhaler Inhale 2 puffs into the lungs every 4 (four) hours as needed for wheezing or shortness of breath. 06/17/19  Yes Julian Hy, DO  allopurinol (ZYLOPRIM) 100 MG tablet Take 100 mg by mouth daily. 11/19/19  Yes [provider]  Alpha-D-Galactosidase (BEANO PO) Take 2-3 capsules by mouth 3 (three) times daily with meals.   Yes [provider]  Alpha-Lipoic Acid 600 MG CAPS Take 600 mg by mouth daily.    Yes [provider]  ammonium lactate (AMLACTIN) 12 % cream Apply topically as needed for dry skin. Patient taking differently: Apply 1 g topically at bedtime. 07/30/19  Yes Trula Slade, DPM  atorvastatin (LIPITOR) 10 MG tablet TAKE 1 TABLET BY MOUTH DAILY Patient taking differently: Take 10 mg by mouth daily. 04/17/19  Yes [name], MD  azelastine (ASTELIN)  0.1 % nasal spray USE 2 SPRAYS IN EACH NOSTRIL DAILY AS DIRECTED Patient taking differently: Place 2 sprays into both nostrils daily as needed for allergies. 01/02/19  Yes Kozlow, Donnamarie Poag, MD  CALCIUM-MAGNESIUM PO Take 1 tablet by mouth 2 (two) times daily.    Yes [provider]  cetirizine (ZYRTEC) 10 MG tablet Take 10 mg by mouth daily.   Yes [provider]  cholecalciferol (VITAMIN D) 25 MCG (1000 UT) tablet Take 1,000 Units by mouth daily.    Yes [provider]  cyproheptadine (PERIACTIN) 4 MG tablet TAKE 2 TABLETS BY MOUTH EVERY EVENING Patient taking differently: Take 8 mg by mouth every evening. 02/02/20  Yes [name], MD  denosumab (PROLIA) 60 MG/ML SOLN injection Inject 60 mg into the skin every 6 (six) months. Administer in upper arm, thigh, or abdomen   Yes [provider]  DEXILANT 60 MG capsule TAKE 1 [place] MORNING Patient taking differently: Take 60 mg by mouth daily. 02/02/20  Yes [name], MD  diclofenac Sodium (VOLTAREN) 1 % GEL APPLY 2 GRAMS TOPICALLY TWICE DAILY Patient taking  differently: Apply 1 application topically 4 (four) times daily. 09/10/19  Yes PickardCammie Mcgee, MD  DULERA 200-5 MCG/ACT AERO INHALE 2 PUFFS BY MOUTH TWICE DAILY Patient taking differently: Inhale 2 puffs into the lungs 2 (two) times daily. 02/03/20  Yes [name], MD  EPINEPHrine (EPIPEN 2-PAK) 0.3 mg/0.3 mL IJ SOAJ injection Inject 0.3 mLs (0.3 mg total) into the muscle Once PRN. Patient taking differently: Inject 0.3 mg into the muscle as needed for anaphylaxis. 01/23/19  Yes [name], MD  famotidine (PEPCID) 20 MG tablet TAKE 1 TABLET BY MOUTH TWICE A DAY Patient taking differently: Take 20 mg by mouth 2 (two) times daily. 02/01/20  Yes Kozlow, Donnamarie Poag, MD  fluticasone (FLONASE) 50 MCG/ACT nasal spray Place 2 sprays into both nostrils daily. 04/01/19  Yes Julian Hy, DO  folic acid (FOLVITE) 1 MG tablet Take 1 mg by mouth daily.   Yes [provider]  furosemide (LASIX) 40 MG tablet TAKE 1 TABLET BY MOUTH DAILY AS NEEDED Patient taking differently: Take 20-40 mg by mouth See admin instructions. Take 40 mg in the morning and 20 mg in the evening 11/05/19  Yes [name], Cammie Mcgee, MD  gabapentin (NEURONTIN) 600 MG tablet TAKE 1 TABLET BY MOUTH DAILY Patient taking differently: Take 600 mg by mouth daily. 02/03/20  Yes [name], MD  guaiFENesin (MUCINEX) 600 MG 12 hr tablet Take 600 mg by mouth 2 (two) times daily.   Yes [provider]  Lactase 9000 units TABS Take 18,000-27,000 Units by mouth 4 (four) times daily as needed (dairy consumption).   Yes [provider]  leflunomide (ARAVA) 20 MG tablet Take 20 mg by mouth daily. 12/14/19  Yes [provider]  lidocaine (LIDODERM) 5 % Place 1 patch onto the skin daily as needed (pain). Remove & Discard patch within 12 hours or as directed by MD 01/23/18  Yes Aline August, MD  magnesium oxide (MAG-OX) 400 MG tablet Take 400 mg by mouth at bedtime.   Yes [provider]  mirtazapine  (REMERON SOL-TAB) 30 MG disintegrating tablet DISSOLVE 1 TABLET BY MOUTH EVERY NIGHT AT BEDTIME Patient taking differently: Take 30 mg by mouth at bedtime. 02/02/20  Yes PickardCammie Mcgee, MD  MITIGARE 0.6 MG CAPS Take 0.6 mg by mouth every other day. 01/16/20  Yes  [provider]  montelukast (SINGULAIR) 10 MG tablet TAKE 1 TABLET BY MOUTH DAILY Patient taking differently: Take 10 mg by mouth every evening. 02/01/20  Yes Kozlow, Donnamarie Poag, MD  mupirocin ointment (BACTROBAN) 2 % Apply 1 application topically 2 (two) times daily. Patient taking differently: Apply 1 application topically at bedtime. 10/05/19  Yes Trula Slade, DPM  Nystatin (GERHARDT'S BUTT CREAM) CREA Apply 1 application topically daily as needed (hemorrhoids).   Yes [provider]  nystatin (MYCOSTATIN) 100000 UNIT/ML suspension SWISH AND SPIT 5 MLS TWICE DAILY Patient taking differently: Use as directed 5 mLs in the mouth or throat at bedtime. 08/27/19  Yes Kozlow, Donnamarie Poag, MD  nystatin-triamcinolone ointment [facility]) Apply topically 2 (two) times daily. Patient taking differently: Apply 1 application topically 2 (two) times daily as needed (yeast infections). 07/07/19  Yes [name], MD  predniSONE (DELTASONE) 5 MG tablet Take 15 mg by mouth daily with breakfast.   Yes [provider]  Prenatal Vit-Fe Fumarate-FA (PRENATAL MULTIVITAMIN) TABS tablet Take 1 tablet by mouth daily.   Yes [provider]  Probiotic Product (ALIGN PO) Take 1 capsule by mouth daily.   Yes [provider]  Propylene Glycol (SYSTANE COMPLETE) 0.6 % SOLN Place 1 drop into both eyes 2 (two) times daily.   Yes [provider]  Salicylic Acid-Urea (KERASAL EX) Apply 1 application topically at bedtime.   Yes [provider]  Tiotropium Bromide Monohydrate (SPIRIVA RESPIMAT) 2.5 MCG/ACT AERS Inhale 1 puff into the lungs daily. 05/01/19  Yes Clark, Mickel Baas P, DO  XARELTO 20 MG TABS tablet TAKE 1  TABLET BY MOUTH DAILY WITH SUPPER Patient taking differently: Take 20 mg by mouth every evening. 02/02/20  Yes [name], MD  albuterol (PROVENTIL) (2.5 MG/3ML) 0.083% nebulizer solution Take 3 mLs (2.5 mg total) by nebulization in the morning and at bedtime. Along with flutter valve 02/04/20   Noemi Chapel P, DO  hydrocortisone cream 0.5 % APPLY TO THE AFFECTED AREA(S) AS DIRECTED Patient not taking: No sig reported 02/02/20   [name], MD  Respiratory Therapy Supplies (FLUTTER) DEVI Use as directed 10/22/14   Elsie Stain, MD  rifaximin (XIFAXAN) 550 MG TABS tablet Take 1 tablet (550 mg total) by mouth 3 (three) times daily. Patient not taking: No sig reported 12/24/19   Gatha Mayer, MD  sodium chloride HYPERTONIC 3 % nebulizer solution Take by nebulization 2 (two) times daily. Patient taking differently: Take 4 mLs by nebulization 2 (two) times daily. 02/02/20   Julian Hy, DO  Spacer/Aero-Holding Chambers (AEROCHAMBER PLUS) inhaler Use as instructed 07/29/18   Parrett, Fonnie Mu, NP  sulfamethoxazole-trimethoprim (BACTRIM DS) 800-160 MG tablet Take 1 tablet by mouth 2 (two) times daily. Patient not taking: No sig reported 11/25/19   Noemi Chapel P, DO  Tocilizumab (ACTEMRA IV) Inject 1 Dose into the vein every 30 (thirty) days. 01/06/20   [provider]   Social History   Socioeconomic History  . Marital status: Divorced    Spouse name: Not on file  . Number of children: 2  . Years of education: college  . Highest education level: Not on file  Occupational History  . Occupation: Disabled    Comment: Retired Engineer, production: RETIRED  Tobacco Use  . Smoking status: Passive Smoke Exposure - Never Smoker  . Smokeless tobacco: Never Used  . Tobacco comment: Parents  Vaping Use  . Vaping Use:  Never used  Substance and Sexual Activity  . Alcohol use: Not Currently    Alcohol/week: 0.0 standard drinks  . Drug use: No  .  Sexual activity: Never  Other Topics Concern  . Not on file  Social History Narrative   Patient lives at home alone. Patient  divorced.    Patient has her BS degree.   Right handed.   Caffeine- sometimes coffee.      [facility]:   Born in [place], [place]. She worked as a Armed forces operational officer. She has no pets currently. She does have indoor plants. Previously had mold in her home that was remediated. Carpet was removed.          Social Determinants of Health   Financial Resource Strain: Not on file  Food Insecurity: Not on file  Transportation Needs: Not on file  Physical Activity: Not on file  Stress: Not on file  Social Connections: Not on file   Family History  Problem Relation Age of Onset  . Allergies Mother   . Heart disease Mother   . Arthritis Mother   . Lung cancer Mother   . Diabetes Mother   . Allergies Father   . Heart disease Father   . Arthritis Father   . Stroke Father   . Colon cancer Other        Maternal half aunt/Maternal half uncle  . Colitis Daughter   . Diabetes Maternal Grandfather     ROS: Currently denies lightheadedness, dizziness, Fever, chills, CP, SOB   No personal history of MI or CVA. History of R leg DVT and 2 PE's currently on Xarelto. No loose teeth or dentures All other systems have been reviewed and were otherwise currently negative with the exception of those mentioned in the HPI and as above.  Objective: Vitals: Ht: 5\' 1"  Wt: 136 lbs Temp: 97.3 F BP: 139/71 Pulse: 88 bpm O2 94% on room air.   Physical Exam: General: Alert, NAD.   HEENT: EOMI, Good Neck Extension  Pulm: mildly increased work of breathing  Clear B/L A/P w/o crackle or wheeze. CV: RRR, No m/g/r appreciated  GI: soft, NT, ND Neuro: Neuro without gross focal deficit.  Sensation intact distally Skin: No lesions in the area of chief complaint MSK/Surgical Site: right shoulder pain with range of motion.  Forward flexion/abduction approximately 0-40 degrees.   Internal rotation to mid right buttock.  External rotation to 5 degrees.  No AC pain. NVI distally.  Imaging Review Plain radiographs demonstrate severe degenerative joint disease of the right shoulder.   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 The treatment options including medical management, injection therapy, and arthroplasty were discussed at length. The risks and benefits of Procedure(s): REVERSE SHOULDER ARTHROPLASTY were presented and reviewed.  The risks of nonoperative treatment, versus surgical intervention including but not limited to continued pain, aseptic loosening, stiffness, dislocation/subluxation, infection, bleeding, nerve injury, blood clots, cardiopulmonary complications, morbidity, mortality, among others were discussed. The patient verbalizes understanding and wishes to proceed with the plan.  Patient is being admitted for surgery, OT, pain control, prophylactic antibiotics, VTE prophylaxis, progressive ambulation, ADL's and discharge planning.   Dental prophylaxis discussed and recommended for 2 years postoperatively   The patient does not meet the criteria for TXA  Patient will be restarted on her Xarelto for DVT prophylaxis in addition to SCDs, and early ambulation.  The patient is planning to be discharged home care of her daughter and sons.  Anticipated LOS equal to or greater than 2 midnights due to - Age 41 and older with one or more of the following:  - Obesity  - Expected need for hospital services (PT, OT, Nursing) required for safe  discharge  - Anticipated need for postoperative skilled nursing care or inpatient rehab  - Active co-morbidities: DVT/VTE and Respiratory  Failure/COPD   Ventura Bruns, PA-C 02/16/2020 9:46 AM

## 2020-02-14 DIAGNOSIS — R0602 Shortness of breath: Secondary | ICD-10-CM | POA: Diagnosis not present

## 2020-02-14 DIAGNOSIS — J471 Bronchiectasis with (acute) exacerbation: Secondary | ICD-10-CM | POA: Diagnosis not present

## 2020-02-14 DIAGNOSIS — G894 Chronic pain syndrome: Secondary | ICD-10-CM | POA: Diagnosis not present

## 2020-02-14 DIAGNOSIS — J45909 Unspecified asthma, uncomplicated: Secondary | ICD-10-CM | POA: Diagnosis not present

## 2020-02-14 DIAGNOSIS — J4540 Moderate persistent asthma, uncomplicated: Secondary | ICD-10-CM | POA: Diagnosis not present

## 2020-02-14 DIAGNOSIS — I5032 Chronic diastolic (congestive) heart failure: Secondary | ICD-10-CM | POA: Diagnosis not present

## 2020-02-14 DIAGNOSIS — G4736 Sleep related hypoventilation in conditions classified elsewhere: Secondary | ICD-10-CM | POA: Diagnosis not present

## 2020-02-14 DIAGNOSIS — M0600 Rheumatoid arthritis without rheumatoid factor, unspecified site: Secondary | ICD-10-CM | POA: Diagnosis not present

## 2020-02-14 DIAGNOSIS — G4733 Obstructive sleep apnea (adult) (pediatric): Secondary | ICD-10-CM | POA: Diagnosis not present

## 2020-02-15 ENCOUNTER — Ambulatory Visit: Payer: Medicare PPO | Admitting: Podiatry

## 2020-02-15 ENCOUNTER — Other Ambulatory Visit: Payer: Self-pay

## 2020-02-15 ENCOUNTER — Other Ambulatory Visit: Payer: Self-pay | Admitting: *Deleted

## 2020-02-15 ENCOUNTER — Ambulatory Visit (INDEPENDENT_AMBULATORY_CARE_PROVIDER_SITE_OTHER): Payer: Medicare PPO

## 2020-02-15 ENCOUNTER — Other Ambulatory Visit (HOSPITAL_COMMUNITY)
Admission: RE | Admit: 2020-02-15 | Discharge: 2020-02-15 | Disposition: A | Discharge status: Home / Self Care | Payer: Medicare PPO | Source: Ambulatory Visit | Attending: Orthopedic Surgery | Admitting: Orthopedic Surgery

## 2020-02-15 DIAGNOSIS — M0600 Rheumatoid arthritis without rheumatoid factor, unspecified site: Secondary | ICD-10-CM | POA: Diagnosis present

## 2020-02-15 DIAGNOSIS — L97512 Non-pressure chronic ulcer of other part of right foot with fat layer exposed: Secondary | ICD-10-CM

## 2020-02-15 DIAGNOSIS — Z7952 Long term (current) use of systemic steroids: Secondary | ICD-10-CM | POA: Diagnosis not present

## 2020-02-15 DIAGNOSIS — S92302D Fracture of unspecified metatarsal bone(s), left foot, subsequent encounter for fracture with routine healing: Secondary | ICD-10-CM

## 2020-02-15 DIAGNOSIS — M87011 Idiopathic aseptic necrosis of right shoulder: Secondary | ICD-10-CM | POA: Diagnosis not present

## 2020-02-15 DIAGNOSIS — Z885 Allergy status to narcotic agent status: Secondary | ICD-10-CM | POA: Diagnosis not present

## 2020-02-15 DIAGNOSIS — S92301K Fracture of unspecified metatarsal bone(s), right foot, subsequent encounter for fracture with nonunion: Secondary | ICD-10-CM

## 2020-02-15 DIAGNOSIS — Z7722 Contact with and (suspected) exposure to environmental tobacco smoke (acute) (chronic): Secondary | ICD-10-CM | POA: Diagnosis present

## 2020-02-15 DIAGNOSIS — Z7901 Long term (current) use of anticoagulants: Secondary | ICD-10-CM | POA: Diagnosis not present

## 2020-02-15 DIAGNOSIS — Z886 Allergy status to analgesic agent status: Secondary | ICD-10-CM | POA: Diagnosis not present

## 2020-02-15 DIAGNOSIS — E039 Hypothyroidism, unspecified: Secondary | ICD-10-CM | POA: Diagnosis present

## 2020-02-15 DIAGNOSIS — R0602 Shortness of breath: Secondary | ICD-10-CM | POA: Diagnosis not present

## 2020-02-15 DIAGNOSIS — G8918 Other acute postprocedural pain: Secondary | ICD-10-CM | POA: Diagnosis not present

## 2020-02-15 DIAGNOSIS — Z86718 Personal history of other venous thrombosis and embolism: Secondary | ICD-10-CM | POA: Diagnosis not present

## 2020-02-15 DIAGNOSIS — Z981 Arthrodesis status: Secondary | ICD-10-CM | POA: Diagnosis not present

## 2020-02-15 DIAGNOSIS — Z881 Allergy status to other antibiotic agents status: Secondary | ICD-10-CM | POA: Diagnosis not present

## 2020-02-15 DIAGNOSIS — D72829 Elevated white blood cell count, unspecified: Secondary | ICD-10-CM | POA: Diagnosis not present

## 2020-02-15 DIAGNOSIS — E739 Lactose intolerance, unspecified: Secondary | ICD-10-CM | POA: Diagnosis present

## 2020-02-15 DIAGNOSIS — Z79899 Other long term (current) drug therapy: Secondary | ICD-10-CM | POA: Diagnosis not present

## 2020-02-15 DIAGNOSIS — J9811 Atelectasis: Secondary | ICD-10-CM | POA: Diagnosis present

## 2020-02-15 DIAGNOSIS — M19111 Post-traumatic osteoarthritis, right shoulder: Secondary | ICD-10-CM | POA: Diagnosis present

## 2020-02-15 DIAGNOSIS — Z91013 Allergy to seafood: Secondary | ICD-10-CM | POA: Diagnosis not present

## 2020-02-15 DIAGNOSIS — Z888 Allergy status to other drugs, medicaments and biological substances status: Secondary | ICD-10-CM | POA: Diagnosis not present

## 2020-02-15 DIAGNOSIS — R0600 Dyspnea, unspecified: Secondary | ICD-10-CM | POA: Diagnosis not present

## 2020-02-15 DIAGNOSIS — E876 Hypokalemia: Secondary | ICD-10-CM | POA: Diagnosis not present

## 2020-02-15 DIAGNOSIS — Z471 Aftercare following joint replacement surgery: Secondary | ICD-10-CM | POA: Diagnosis not present

## 2020-02-15 DIAGNOSIS — M87211 Osteonecrosis due to previous trauma, right shoulder: Secondary | ICD-10-CM | POA: Diagnosis present

## 2020-02-15 DIAGNOSIS — Z20822 Contact with and (suspected) exposure to covid-19: Secondary | ICD-10-CM | POA: Diagnosis present

## 2020-02-15 DIAGNOSIS — Z880 Allergy status to penicillin: Secondary | ICD-10-CM | POA: Diagnosis not present

## 2020-02-15 DIAGNOSIS — I5032 Chronic diastolic (congestive) heart failure: Secondary | ICD-10-CM | POA: Diagnosis present

## 2020-02-15 DIAGNOSIS — I825Y1 Chronic embolism and thrombosis of unspecified deep veins of right proximal lower extremity: Secondary | ICD-10-CM | POA: Diagnosis not present

## 2020-02-15 DIAGNOSIS — J449 Chronic obstructive pulmonary disease, unspecified: Secondary | ICD-10-CM | POA: Diagnosis present

## 2020-02-15 DIAGNOSIS — Z01812 Encounter for preprocedural laboratory examination: Secondary | ICD-10-CM | POA: Insufficient documentation

## 2020-02-15 DIAGNOSIS — G4733 Obstructive sleep apnea (adult) (pediatric): Secondary | ICD-10-CM | POA: Diagnosis present

## 2020-02-15 DIAGNOSIS — K219 Gastro-esophageal reflux disease without esophagitis: Secondary | ICD-10-CM | POA: Diagnosis present

## 2020-02-15 DIAGNOSIS — M87811 Other osteonecrosis, right shoulder: Secondary | ICD-10-CM | POA: Diagnosis present

## 2020-02-15 DIAGNOSIS — E785 Hyperlipidemia, unspecified: Secondary | ICD-10-CM | POA: Diagnosis not present

## 2020-02-15 DIAGNOSIS — Z96611 Presence of right artificial shoulder joint: Secondary | ICD-10-CM | POA: Diagnosis not present

## 2020-02-15 DIAGNOSIS — D62 Acute posthemorrhagic anemia: Secondary | ICD-10-CM | POA: Diagnosis not present

## 2020-02-15 DIAGNOSIS — M069 Rheumatoid arthritis, unspecified: Secondary | ICD-10-CM | POA: Diagnosis not present

## 2020-02-15 DIAGNOSIS — M19011 Primary osteoarthritis, right shoulder: Secondary | ICD-10-CM | POA: Diagnosis not present

## 2020-02-15 MED ORDER — CEPHALEXIN 500 MG PO CAPS: 500.0000 mg | ORAL_CAPSULE | Freq: Three times a day (TID) | ORAL | 0 refills | Status: DC

## 2020-02-15 MED ORDER — GERHARDT'S BUTT CREAM: 1.0000 "application " | TOPICAL_CREAM | Freq: Every day | CUTANEOUS | 11 refills | Status: DC | PRN

## 2020-02-15 NOTE — Progress Notes (Signed)
Anesthesia Chart Review   Case: [id number] Date/Time: 02/16/20 0945   Procedure: REVERSE SHOULDER ARTHROPLASTY (Right Shoulder)   Anesthesia type: Choice   Pre-op diagnosis: djd right shoulder   Location: [facility]   Surgeons: Marchia Bond, MD      DISCUSSION:69 y.o. never smoker with h/o GERD, asthma, sleep apnea w/CPAP, Graves disease, DVT (on Xarelto), right shoulder djd scheduled for above procedure 02/16/2020 with Dr. Marchia Bond.   Pt last seen by pulmonology 01/26/2020. Per OV note, "ARISCAT predicts low to moderate risk for post-op pulmonary complications. Assuming a 2-3 hour procedure, she would have an ARISCAT score of 38 points= 13.3% risk of post-operative pulmonary complication. My fear is that for her this may be an underestimate given how deconditioned she is. She needs NIPPV available after her procedure during recovery. Her anticoagulation should be resumed as soon as it is safe post-operatively. I would recommend inpatient observation post-operatively to ensure pain control and appropriate respiratory support."  Per cardiology preoperative risk assessment 02/01/2020, "Chart reviewed as part of pre-operative protocol coverage. Patient was contacted 02/01/2020 in reference to pre-operative risk assessment for pending surgery as outlined below.  Ebony Scott was last seen on 01/04/20 by Dr. Harrell Gave.  Since that day, Ebony Scott has done well. Per Dr. Harrell Gave, acceptable risk to proceed with surgery.  Therefore, based on ACC/AHA guidelines, the patient would be at acceptable risk for the planned procedure without further cardiovascular testing.   Per our clinical pharmacist: Patient with diagnosis ofDVTon Xareltofor anticoagulation.  Procedure:RIGHT REVERSE TOTAL SHOULDER REPLACEMENT Date of procedure:TBD On Xarelto for multiple DVT. Xarelto managed by PCP. Will defer to Dr. Dennard Schaumann.  Per Dr. Dennard Schaumann - pt may hold xarelto for 48 hr prior to  surgery. Resume as soon as safe to do so per surgeon."  Anticipate pt can proceed with planned procedure barring acute status change.   VS: BP 131/66   Pulse 79   Temp 36.9 C (Oral)   Resp 20   Ht 5\' 1"  (1.549 m)   Wt 64 kg   SpO2 94%   BMI 26.64 kg/m   PROVIDERS: [name], MD is PCP  Buford Dresser, MD is Cardiologist  LABS: Labs reviewed: Acceptable for surgery. (all labs ordered are listed, but only abnormal results are displayed)  Labs Reviewed  CBC - Abnormal; Notable for the following components:      Result Value   RBC 3.31 (*)    Hemoglobin 11.3 (*)    HCT 35.4 (*)    MCV 106.9 (*)    MCH 34.1 (*)    RDW 23.1 (*)    nRBC 0.9 (*)    All other components within normal limits  SURGICAL PCR SCREEN     IMAGES:   EKG: 02/26/2019 Rate 72 bpm  NSR Septal infarct, age undetermined   CV: Echo 07/10/2019 IMPRESSIONS    1. Hyperdynamic LV function; grade 1 diastolic dysfunction; moderate LVH;  trace AI.  2. Left ventricular ejection fraction, by estimation, is >75%. The left  ventricle has hyperdynamic function. The left ventricle has no regional  wall motion abnormalities. There is moderate left ventricular hypertrophy.  Left ventricular diastolic  parameters are consistent with Grade I diastolic dysfunction (impaired  relaxation).  3. Right ventricular systolic function is normal. The right ventricular  size is normal.  4. The mitral valve is normal in structure. No evidence of mitral valve  regurgitation. No evidence of mitral stenosis.  5. The  aortic valve has an indeterminant number of cusps. Aortic valve  regurgitation is trivial. No aortic stenosis is present.  Past Medical History:  Diagnosis Date  . Acute deep vein thrombosis (DVT) of right lower extremity (HCC) 12/13/2017  . Allergic rhinitis   . Allergy    SEASONAL  . Anemia   . Anxiety    pt denies  . Arthritis    Phreesia 10/21/2019  . Asthma   . Asthma     Phreesia 10/21/2019  . Breast cancer (HCC) 1998   in remission  . Cataract    REMOVED  . Clostridium difficile colitis 01/14/2018  . Clostridium difficile diarrhea 01/14/2018  . Complication of anesthesia    "had hard time waking up from it several times" (02/20/2012)  . Depression    "some; don't take anything for it" (02/20/2012)  . Diverticulosis   . DVT (deep venous thrombosis) (HCC)   . Exertional dyspnea   . Fibromyalgia 11/2011  . GERD (gastroesophageal reflux disease)   . Graves disease   . Headache(784.0)    "related to allergies; more at different times during the year" (02/20/2012)  . Hemorrhoids   . Hiatal hernia    back and neck  . Hx of adenomatous colonic polyps 04/12/2016  . Hypercholesteremia    good cholesterol is high  . Hypothyroidism   . IBS (irritable bowel syndrome)   . Moderate persistent asthma    -FeV1 72% 2011, -IgE 102 2011, CT sinus Neg 2011  . Osteoporosis    on reclast yearly  . Peripheral vascular disease (HCC) 2019   DVTs  . Pneumonia 04/2011; ~ 11/2011   "double; single" (02/20/2012)  . Seronegative rheumatoid arthritis (HCC)    Dr. Casimer Lanius  . SIRS (systemic inflammatory response syndrome) (HCC) 02/10/2018  . Tracheobronchomalacia     Past Surgical History:  Procedure Laterality Date  . ABDOMINAL HYSTERECTOMY N/A    Phreesia 10/21/2019  . ANTERIOR AND POSTERIOR REPAIR  1990's  . APPENDECTOMY    . BREAST LUMPECTOMY  1998   left  . BREAST SURGERY N/A    Phreesia 10/21/2019  . CARPOMETACARPEL (CMC) FUSION OF THUMB WITH AUTOGRAFT FROM RADIUS  ~ 2009   "both thumbs" (02/20/2012)  . CATARACT EXTRACTION W/ INTRAOCULAR LENS  IMPLANT, BILATERAL  2012  . CERVICAL DISCECTOMY  10/2001   C5-C6  . CERVICAL FUSION  2003   C3-C4  . CHOLECYSTECTOMY    . COLONOSCOPY    . DEBRIDEMENT TENNIS ELBOW  ?1970's   right  . ESOPHAGOGASTRODUODENOSCOPY    . EYE SURGERY N/A    Phreesia 10/21/2019  . HYSTERECTOMY    . KNEE ARTHROPLASTY  ?1990's    "?right; w/cartilage repair" (02/20/2012)  . NASAL SEPTUM SURGERY  1980's  . POSTERIOR CERVICAL FUSION/FORAMINOTOMY  2004   "failed initial fusion; rewired  anterior neck" (02/20/2012)  . SPINE SURGERY N/A    Phreesia 10/21/2019  . TONSILLECTOMY  ~ 1953  . VESICOVAGINAL FISTULA CLOSURE W/ TAH  1988  . VIDEO BRONCHOSCOPY Bilateral 08/23/2016   Procedure: VIDEO BRONCHOSCOPY WITH FLUORO;  Surgeon: Roslynn Amble, MD;  Location: [facility];  Service: Cardiopulmonary;  Laterality: Bilateral;    MEDICATIONS: . acetaminophen (TYLENOL) 650 MG CR tablet  . albuterol (PROAIR HFA) 108 (90 Base) MCG/ACT inhaler  . albuterol (PROVENTIL) (2.5 MG/3ML) 0.083% nebulizer solution  . allopurinol (ZYLOPRIM) 100 MG tablet  . Alpha-D-Galactosidase (BEANO PO)  . Alpha-Lipoic Acid 600 MG CAPS  . ammonium lactate (AMLACTIN) 12 % cream  .  atorvastatin (LIPITOR) 10 MG tablet  . azelastine (ASTELIN) 0.1 % nasal spray  . CALCIUM-MAGNESIUM PO  . cetirizine (ZYRTEC) 10 MG tablet  . cholecalciferol (VITAMIN D) 25 MCG (1000 UT) tablet  . cyproheptadine (PERIACTIN) 4 MG tablet  . denosumab (PROLIA) 60 MG/ML SOLN injection  . DEXILANT 60 MG capsule  . diclofenac Sodium (VOLTAREN) 1 % GEL  . DULERA 200-5 MCG/ACT AERO  . EPINEPHrine (EPIPEN 2-PAK) 0.3 mg/0.3 mL IJ SOAJ injection  . famotidine (PEPCID) 20 MG tablet  . fluticasone (FLONASE) 50 MCG/ACT nasal spray  . folic acid (FOLVITE) 1 MG tablet  . furosemide (LASIX) 40 MG tablet  . gabapentin (NEURONTIN) 600 MG tablet  . guaiFENesin (MUCINEX) 600 MG 12 hr tablet  . hydrocortisone cream 0.5 %  . Lactase 9000 units TABS  . leflunomide (ARAVA) 20 MG tablet  . lidocaine (LIDODERM) 5 %  . magnesium oxide (MAG-OX) 400 MG tablet  . mirtazapine (REMERON SOL-TAB) 30 MG disintegrating tablet  . MITIGARE 0.6 MG CAPS  . montelukast (SINGULAIR) 10 MG tablet  . mupirocin ointment (BACTROBAN) 2 %  . Nystatin (GERHARDT'S BUTT CREAM) CREA  . nystatin (MYCOSTATIN) 100000  UNIT/ML suspension  . nystatin-triamcinolone ointment (MYCOLOG)  . predniSONE (DELTASONE) 5 MG tablet  . Prenatal Vit-Fe Fumarate-FA (PRENATAL MULTIVITAMIN) TABS tablet  . Probiotic Product (ALIGN PO)  . Propylene Glycol (SYSTANE COMPLETE) 0.6 % SOLN  . Respiratory Therapy Supplies (FLUTTER) DEVI  . rifaximin (XIFAXAN) 550 MG TABS tablet  . Salicylic Acid-Urea (KERASAL EX)  . sodium chloride HYPERTONIC 3 % nebulizer solution  . Spacer/Aero-Holding Chambers (AEROCHAMBER PLUS) inhaler  . sulfamethoxazole-trimethoprim (BACTRIM DS) 800-160 MG tablet  . Tiotropium Bromide Monohydrate (SPIRIVA RESPIMAT) 2.5 MCG/ACT AERS  . Tocilizumab (ACTEMRA IV)  . XARELTO 20 MG TABS tablet   . denosumab (PROLIA) injection 60 mg    Maia [facility] Pre-Surgical Testing [phone]

## 2020-02-15 NOTE — Anesthesia Preprocedure Evaluation (Addendum)
Anesthesia Evaluation  Patient identified by MRN, date of birth, ID band Patient awake    Reviewed: Allergy & Precautions, NPO status , Patient's Chart, lab work & pertinent test results  History of Anesthesia Complications Negative for: history of anesthetic complications  Airway Mallampati: III  TM Distance: >3 FB Neck ROM: Full    Dental  (+) Caps, Dental Advisory Given   Pulmonary asthma , COPD,  COPD inhaler,  02/15/2020 SARS coronavirus NEG   breath sounds clear to auscultation       Cardiovascular hypertension, Pt. on medications (-) angina+ DVT   Rhythm:Regular Rate:Normal  06/2019 ECHO: EF >75%, mod LVH, Grade 1 DD, no significant valvular abnormalities   Neuro/Psych  Headaches, Anxiety Depression    GI/Hepatic Neg liver ROS, GERD  Medicated and Controlled,  Endo/Other  Hypothyroidism   Renal/GU Renal InsufficiencyRenal disease     Musculoskeletal  (+) Arthritis , Fibromyalgia -  Abdominal   Peds  Hematology xarelto   Anesthesia Other Findings H/o breast cancer  Reproductive/Obstetrics                           Anesthesia Physical Anesthesia Plan  ASA: III  Anesthesia Plan: General   Post-op Pain Management: GA combined w/ Regional for post-op pain   Induction: Intravenous  PONV Risk Score and Plan: 3 and Ondansetron, Dexamethasone and Treatment may vary due to age or medical condition  Airway Management Planned: Oral ETT and Video Laryngoscope Planned  Additional Equipment: None  Intra-op Plan:   Post-operative Plan: Extubation in OR  Informed Consent: I have reviewed the patients History and Physical, chart, labs and discussed the procedure including the risks, benefits and alternatives for the proposed anesthesia with the patient or authorized representative who has indicated his/her understanding and acceptance.     Dental advisory given  Plan Discussed with:  CRNA and Surgeon  Anesthesia Plan Comments: (See PAT note 02/10/2020, Jodell Cipro, PA-C Plan routine monitors, GETA with interscalene block for post op analgesia)      Anesthesia Quick Evaluation

## 2020-02-15 NOTE — Progress Notes (Signed)
Subjective: 69 year old female presents the office today for follow-up evaluation of wound on the right second toe and for fractures bilaterally.  She states the fractures himself do not cause any pain.  She gets some occasional pain of the second toe but this is not new.  She denies any increase in swelling or redness or any drainage or pus.  She was try to keep Medihoney on the wound daily but was difficult to do.Denies any systemic complaints such as fevers, chills, nausea, vomiting. No acute changes since last appointment, and no other complaints at this time.   Objective: AAO x3, NAD DP/PT pulses palpable bilaterally, CRT less than 3 seconds Ulceration of the medial right second toe on the PIPJ which appears to be deeper today.  Only a thin layer of tissue covers the bone.  There is no fluctuance or crepitation but there is no malodor.  No drainage or pus.  There is macerated periwound. On the area the fractures bilaterally there is no pain.  Chronic swelling to the left foot. No pain with calf compression, swelling, warmth, erythema  Assessment: Ulceration right second toe; metatarsal fractures  Plan: -All treatment options discussed with the patient including all alternatives, risks, complications.  -X-rays obtained reviewed.  No evidence of acute osteomyelitis.  Fractures noted the fourth metatarsals which are nonunions on the right side.  Some callus formation is present. -At this point we will discontinue the bone stimulator.  Is been difficult for her to use.  Continue supportive shoes. -Regards the wound on the right side recommended wearing surgical shoe which she has at home.  Has been wearing regular shoes with a toe separator.  The wound is deepened today.  We will switch to using Iodosorb.  X-rays negative for osteomyelitis however discussed that she is at high risk of loss of toe.  We will start her on cephalexin.  Per the chart she is been on this previously and she does not  recall allergy.  No obvious signs of cellulitis. -Discussed possibility of arthroplasty of the right second toe given nonhealing wound which has become deeper.   Vivi Barrack DPM

## 2020-02-16 ENCOUNTER — Inpatient Hospital Stay (HOSPITAL_COMMUNITY): Payer: Medicare PPO

## 2020-02-16 ENCOUNTER — Encounter (HOSPITAL_COMMUNITY)
Admission: RE | Disposition: A | Discharge status: Home / Self Care | Payer: Self-pay | Source: Home / Self Care | Attending: Orthopedic Surgery

## 2020-02-16 ENCOUNTER — Encounter (HOSPITAL_COMMUNITY): Payer: Self-pay | Admitting: Orthopedic Surgery

## 2020-02-16 ENCOUNTER — Inpatient Hospital Stay (HOSPITAL_COMMUNITY)
Admission: RE | Admit: 2020-02-16 | Discharge: 2020-02-19 | DRG: 483 | Disposition: A | Discharge status: Home / Self Care | Payer: Medicare PPO | Attending: Orthopedic Surgery | Admitting: Orthopedic Surgery

## 2020-02-16 ENCOUNTER — Inpatient Hospital Stay (HOSPITAL_COMMUNITY): Payer: Medicare PPO | Admitting: Physician Assistant

## 2020-02-16 ENCOUNTER — Inpatient Hospital Stay (HOSPITAL_COMMUNITY): Payer: Medicare PPO | Admitting: Certified Registered Nurse Anesthetist

## 2020-02-16 DIAGNOSIS — J9811 Atelectasis: Secondary | ICD-10-CM | POA: Diagnosis present

## 2020-02-16 DIAGNOSIS — Z86718 Personal history of other venous thrombosis and embolism: Secondary | ICD-10-CM | POA: Diagnosis not present

## 2020-02-16 DIAGNOSIS — E739 Lactose intolerance, unspecified: Secondary | ICD-10-CM | POA: Diagnosis present

## 2020-02-16 DIAGNOSIS — R0602 Shortness of breath: Secondary | ICD-10-CM

## 2020-02-16 DIAGNOSIS — R0600 Dyspnea, unspecified: Secondary | ICD-10-CM | POA: Diagnosis not present

## 2020-02-16 DIAGNOSIS — I5032 Chronic diastolic (congestive) heart failure: Secondary | ICD-10-CM | POA: Diagnosis present

## 2020-02-16 DIAGNOSIS — K589 Irritable bowel syndrome without diarrhea: Secondary | ICD-10-CM | POA: Diagnosis present

## 2020-02-16 DIAGNOSIS — Z981 Arthrodesis status: Secondary | ICD-10-CM

## 2020-02-16 DIAGNOSIS — Z7952 Long term (current) use of systemic steroids: Secondary | ICD-10-CM | POA: Diagnosis not present

## 2020-02-16 DIAGNOSIS — Z79899 Other long term (current) drug therapy: Secondary | ICD-10-CM

## 2020-02-16 DIAGNOSIS — G4733 Obstructive sleep apnea (adult) (pediatric): Secondary | ICD-10-CM | POA: Diagnosis present

## 2020-02-16 DIAGNOSIS — E039 Hypothyroidism, unspecified: Secondary | ICD-10-CM | POA: Diagnosis not present

## 2020-02-16 DIAGNOSIS — I825Y1 Chronic embolism and thrombosis of unspecified deep veins of right proximal lower extremity: Secondary | ICD-10-CM | POA: Diagnosis not present

## 2020-02-16 DIAGNOSIS — M19011 Primary osteoarthritis, right shoulder: Secondary | ICD-10-CM | POA: Diagnosis not present

## 2020-02-16 DIAGNOSIS — Z20822 Contact with and (suspected) exposure to covid-19: Secondary | ICD-10-CM | POA: Diagnosis present

## 2020-02-16 DIAGNOSIS — Z880 Allergy status to penicillin: Secondary | ICD-10-CM

## 2020-02-16 DIAGNOSIS — E669 Obesity, unspecified: Secondary | ICD-10-CM | POA: Diagnosis present

## 2020-02-16 DIAGNOSIS — Z91013 Allergy to seafood: Secondary | ICD-10-CM | POA: Diagnosis not present

## 2020-02-16 DIAGNOSIS — Z7901 Long term (current) use of anticoagulants: Secondary | ICD-10-CM

## 2020-02-16 DIAGNOSIS — Z853 Personal history of malignant neoplasm of breast: Secondary | ICD-10-CM

## 2020-02-16 DIAGNOSIS — Z881 Allergy status to other antibiotic agents status: Secondary | ICD-10-CM | POA: Diagnosis not present

## 2020-02-16 DIAGNOSIS — Z800 Family history of malignant neoplasm of digestive organs: Secondary | ICD-10-CM

## 2020-02-16 DIAGNOSIS — Z6828 Body mass index (BMI) 28.0-28.9, adult: Secondary | ICD-10-CM

## 2020-02-16 DIAGNOSIS — M19111 Post-traumatic osteoarthritis, right shoulder: Secondary | ICD-10-CM | POA: Diagnosis present

## 2020-02-16 DIAGNOSIS — I739 Peripheral vascular disease, unspecified: Secondary | ICD-10-CM | POA: Diagnosis present

## 2020-02-16 DIAGNOSIS — Z833 Family history of diabetes mellitus: Secondary | ICD-10-CM

## 2020-02-16 DIAGNOSIS — M0600 Rheumatoid arthritis without rheumatoid factor, unspecified site: Secondary | ICD-10-CM | POA: Diagnosis not present

## 2020-02-16 DIAGNOSIS — Z885 Allergy status to narcotic agent status: Secondary | ICD-10-CM | POA: Diagnosis not present

## 2020-02-16 DIAGNOSIS — J449 Chronic obstructive pulmonary disease, unspecified: Secondary | ICD-10-CM | POA: Diagnosis not present

## 2020-02-16 DIAGNOSIS — D62 Acute posthemorrhagic anemia: Secondary | ICD-10-CM | POA: Diagnosis not present

## 2020-02-16 DIAGNOSIS — Z888 Allergy status to other drugs, medicaments and biological substances status: Secondary | ICD-10-CM | POA: Diagnosis not present

## 2020-02-16 DIAGNOSIS — Z8249 Family history of ischemic heart disease and other diseases of the circulatory system: Secondary | ICD-10-CM

## 2020-02-16 DIAGNOSIS — Z7722 Contact with and (suspected) exposure to environmental tobacco smoke (acute) (chronic): Secondary | ICD-10-CM | POA: Diagnosis present

## 2020-02-16 DIAGNOSIS — M069 Rheumatoid arthritis, unspecified: Secondary | ICD-10-CM | POA: Diagnosis not present

## 2020-02-16 DIAGNOSIS — I110 Hypertensive heart disease with heart failure: Secondary | ICD-10-CM | POA: Diagnosis present

## 2020-02-16 DIAGNOSIS — E7800 Pure hypercholesterolemia, unspecified: Secondary | ICD-10-CM | POA: Diagnosis present

## 2020-02-16 DIAGNOSIS — Z886 Allergy status to analgesic agent status: Secondary | ICD-10-CM | POA: Diagnosis not present

## 2020-02-16 DIAGNOSIS — M87211 Osteonecrosis due to previous trauma, right shoulder: Principal | ICD-10-CM | POA: Diagnosis present

## 2020-02-16 DIAGNOSIS — E876 Hypokalemia: Secondary | ICD-10-CM

## 2020-02-16 DIAGNOSIS — K219 Gastro-esophageal reflux disease without esophagitis: Secondary | ICD-10-CM | POA: Diagnosis not present

## 2020-02-16 DIAGNOSIS — Z96611 Presence of right artificial shoulder joint: Secondary | ICD-10-CM

## 2020-02-16 DIAGNOSIS — Z801 Family history of malignant neoplasm of trachea, bronchus and lung: Secondary | ICD-10-CM

## 2020-02-16 DIAGNOSIS — J309 Allergic rhinitis, unspecified: Secondary | ICD-10-CM | POA: Diagnosis present

## 2020-02-16 DIAGNOSIS — M810 Age-related osteoporosis without current pathological fracture: Secondary | ICD-10-CM | POA: Diagnosis present

## 2020-02-16 DIAGNOSIS — Z96612 Presence of left artificial shoulder joint: Secondary | ICD-10-CM

## 2020-02-16 DIAGNOSIS — J398 Other specified diseases of upper respiratory tract: Secondary | ICD-10-CM | POA: Diagnosis present

## 2020-02-16 DIAGNOSIS — M109 Gout, unspecified: Secondary | ICD-10-CM | POA: Diagnosis present

## 2020-02-16 DIAGNOSIS — Z8261 Family history of arthritis: Secondary | ICD-10-CM

## 2020-02-16 DIAGNOSIS — R0609 Other forms of dyspnea: Secondary | ICD-10-CM

## 2020-02-16 DIAGNOSIS — D72829 Elevated white blood cell count, unspecified: Secondary | ICD-10-CM | POA: Diagnosis not present

## 2020-02-16 DIAGNOSIS — E785 Hyperlipidemia, unspecified: Secondary | ICD-10-CM | POA: Diagnosis present

## 2020-02-16 DIAGNOSIS — M87811 Other osteonecrosis, right shoulder: Secondary | ICD-10-CM | POA: Diagnosis present

## 2020-02-16 DIAGNOSIS — Z823 Family history of stroke: Secondary | ICD-10-CM

## 2020-02-16 HISTORY — PX: REVERSE SHOULDER ARTHROPLASTY: SHX5054

## 2020-02-16 LAB — SARS CORONAVIRUS 2 (TAT 6-24 HRS): SARS Coronavirus 2: NEGATIVE

## 2020-02-16 SURGERY — ARTHROPLASTY, SHOULDER, TOTAL, REVERSE
Anesthesia: General | Site: Shoulder | Laterality: Right

## 2020-02-16 MED ORDER — PROMETHAZINE HCL 25 MG/ML IJ SOLN: 6.2500 mg | INTRAMUSCULAR | Status: DC | PRN

## 2020-02-16 MED ORDER — MEPERIDINE HCL 50 MG/ML IJ SOLN: 6.2500 mg | INTRAMUSCULAR | Status: DC | PRN

## 2020-02-16 MED ORDER — ALLOPURINOL 100 MG PO TABS
100.0000 mg | ORAL_TABLET | Freq: Every day | ORAL | Status: DC
  Administered 2020-02-17 – 2020-02-19 (×3): 100 mg via ORAL
  Filled 2020-02-16 (×3): qty 1

## 2020-02-16 MED ORDER — DOCUSATE SODIUM 100 MG PO CAPS
100.0000 mg | ORAL_CAPSULE | Freq: Two times a day (BID) | ORAL | Status: DC
  Administered 2020-02-16 – 2020-02-17 (×3): 100 mg via ORAL
  Filled 2020-02-16 (×4): qty 1

## 2020-02-16 MED ORDER — HYDROMORPHONE HCL 1 MG/ML IJ SOLN: 0.2500 mg | INTRAMUSCULAR | Status: DC | PRN

## 2020-02-16 MED ORDER — ORAL CARE MOUTH RINSE: 15.0000 mL | Freq: Once | OROMUCOSAL | Status: AC

## 2020-02-16 MED ORDER — ALBUTEROL SULFATE HFA 108 (90 BASE) MCG/ACT IN AERS: 2.0000 | INHALATION_SPRAY | RESPIRATORY_TRACT | Status: DC | PRN

## 2020-02-16 MED ORDER — ALBUTEROL SULFATE (2.5 MG/3ML) 0.083% IN NEBU
2.5000 mg | INHALATION_SOLUTION | Freq: Two times a day (BID) | RESPIRATORY_TRACT | Status: DC
  Administered 2020-02-16: 2.5 mg via RESPIRATORY_TRACT
  Filled 2020-02-16 (×2): qty 3

## 2020-02-16 MED ORDER — STERILE WATER FOR IRRIGATION IR SOLN
Status: DC | PRN
  Administered 2020-02-16: 2000 mL

## 2020-02-16 MED ORDER — FENTANYL CITRATE (PF) 100 MCG/2ML IJ SOLN
INTRAMUSCULAR | Status: AC
  Filled 2020-02-16: qty 2

## 2020-02-16 MED ORDER — MIDAZOLAM HCL 2 MG/2ML IJ SOLN: 0.5000 mg | Freq: Once | INTRAMUSCULAR | Status: DC | PRN

## 2020-02-16 MED ORDER — POTASSIUM CHLORIDE IN NACL 20-0.45 MEQ/L-% IV SOLN
INTRAVENOUS | Status: DC
  Filled 2020-02-16 (×2): qty 1000

## 2020-02-16 MED ORDER — FOLIC ACID 1 MG PO TABS
1.0000 mg | ORAL_TABLET | Freq: Every day | ORAL | Status: DC
  Administered 2020-02-17 – 2020-02-19 (×3): 1 mg via ORAL
  Filled 2020-02-16 (×3): qty 1

## 2020-02-16 MED ORDER — BUPIVACAINE-EPINEPHRINE (PF) 0.5% -1:200000 IJ SOLN
INTRAMUSCULAR | Status: DC | PRN
  Administered 2020-02-16: 10 mL via PERINEURAL

## 2020-02-16 MED ORDER — LORATADINE 10 MG PO TABS
10.0000 mg | ORAL_TABLET | Freq: Every day | ORAL | Status: DC
  Administered 2020-02-17 – 2020-02-19 (×3): 10 mg via ORAL
  Filled 2020-02-16 (×4): qty 1

## 2020-02-16 MED ORDER — POLYETHYLENE GLYCOL 3350 17 G PO PACK: 17.0000 g | PACK | Freq: Every day | ORAL | Status: DC | PRN

## 2020-02-16 MED ORDER — PROPOFOL 10 MG/ML IV BOLUS
INTRAVENOUS | Status: DC | PRN
  Administered 2020-02-16: 120 mg via INTRAVENOUS

## 2020-02-16 MED ORDER — FUROSEMIDE 20 MG PO TABS
20.0000 mg | ORAL_TABLET | Freq: Every evening | ORAL | Status: DC
  Administered 2020-02-16 – 2020-02-18 (×3): 20 mg via ORAL
  Filled 2020-02-16 (×3): qty 1

## 2020-02-16 MED ORDER — PHENOL 1.4 % MT LIQD: 1.0000 | OROMUCOSAL | Status: DC | PRN

## 2020-02-16 MED ORDER — NYSTATIN 100000 UNIT/ML MT SUSP
5.0000 mL | Freq: Every day | OROMUCOSAL | Status: DC
  Administered 2020-02-17 – 2020-02-18 (×2): 500000 [IU] via OROMUCOSAL
  Filled 2020-02-16 (×2): qty 5

## 2020-02-16 MED ORDER — RIVAROXABAN 10 MG PO TABS
20.0000 mg | ORAL_TABLET | Freq: Every evening | ORAL | Status: DC
  Administered 2020-02-16 – 2020-02-18 (×3): 20 mg via ORAL
  Filled 2020-02-16 (×3): qty 2

## 2020-02-16 MED ORDER — TIOTROPIUM BROMIDE MONOHYDRATE 18 MCG IN CAPS
18.0000 ug | ORAL_CAPSULE | Freq: Every day | RESPIRATORY_TRACT | Status: DC
  Filled 2020-02-16: qty 5

## 2020-02-16 MED ORDER — VITAMIN D 25 MCG (1000 UNIT) PO TABS
1000.0000 [IU] | ORAL_TABLET | Freq: Every day | ORAL | Status: DC
  Administered 2020-02-17 – 2020-02-19 (×3): 1000 [IU] via ORAL
  Filled 2020-02-16 (×3): qty 1

## 2020-02-16 MED ORDER — PHENYLEPHRINE 40 MCG/ML (10ML) SYRINGE FOR IV PUSH (FOR BLOOD PRESSURE SUPPORT)
PREFILLED_SYRINGE | INTRAVENOUS | Status: DC | PRN
  Administered 2020-02-16 (×2): 80 ug via INTRAVENOUS

## 2020-02-16 MED ORDER — TRAMADOL HCL 50 MG PO TABS
50.0000 mg | ORAL_TABLET | Freq: Four times a day (QID) | ORAL | Status: DC | PRN
  Administered 2020-02-16 – 2020-02-19 (×10): 50 mg via ORAL
  Filled 2020-02-16 (×10): qty 1

## 2020-02-16 MED ORDER — ONDANSETRON HCL 4 MG/2ML IJ SOLN: 4.0000 mg | Freq: Four times a day (QID) | INTRAMUSCULAR | Status: DC | PRN

## 2020-02-16 MED ORDER — SCOPOLAMINE 1 MG/3DAYS TD PT72
1.0000 | MEDICATED_PATCH | TRANSDERMAL | Status: DC
  Administered 2020-02-16: 1.5 mg via TRANSDERMAL
  Filled 2020-02-16: qty 1

## 2020-02-16 MED ORDER — PROPYLENE GLYCOL 0.6 % OP SOLN: 1.0000 [drp] | Freq: Two times a day (BID) | OPHTHALMIC | Status: DC

## 2020-02-16 MED ORDER — AZELASTINE HCL 0.1 % NA SOLN: 2.0000 | Freq: Every day | NASAL | Status: DC | PRN

## 2020-02-16 MED ORDER — METOCLOPRAMIDE HCL 5 MG PO TABS: 5.0000 mg | ORAL_TABLET | Freq: Three times a day (TID) | ORAL | Status: DC | PRN

## 2020-02-16 MED ORDER — FAMOTIDINE 20 MG PO TABS
20.0000 mg | ORAL_TABLET | Freq: Two times a day (BID) | ORAL | Status: DC
  Administered 2020-02-16 – 2020-02-19 (×6): 20 mg via ORAL
  Filled 2020-02-16 (×6): qty 1

## 2020-02-16 MED ORDER — ROCURONIUM BROMIDE 10 MG/ML (PF) SYRINGE
PREFILLED_SYRINGE | INTRAVENOUS | Status: AC
  Filled 2020-02-16: qty 10

## 2020-02-16 MED ORDER — ROCURONIUM BROMIDE 100 MG/10ML IV SOLN
INTRAVENOUS | Status: DC | PRN
  Administered 2020-02-16: 70 mg via INTRAVENOUS
  Administered 2020-02-16: 10 mg via INTRAVENOUS

## 2020-02-16 MED ORDER — DEXAMETHASONE SODIUM PHOSPHATE 10 MG/ML IJ SOLN
INTRAMUSCULAR | Status: DC | PRN
  Administered 2020-02-16: 5 mg via INTRAVENOUS

## 2020-02-16 MED ORDER — MAGNESIUM CITRATE PO SOLN: 1.0000 | Freq: Once | ORAL | Status: DC | PRN

## 2020-02-16 MED ORDER — MENTHOL 3 MG MT LOZG
1.0000 | LOZENGE | OROMUCOSAL | Status: DC | PRN
  Administered 2020-02-18: 3 mg via ORAL
  Filled 2020-02-16: qty 9

## 2020-02-16 MED ORDER — GUAIFENESIN ER 600 MG PO TB12: 600.0000 mg | ORAL_TABLET | Freq: Two times a day (BID) | ORAL | Status: DC | PRN

## 2020-02-16 MED ORDER — LIDOCAINE HCL (PF) 2 % IJ SOLN
INTRAMUSCULAR | Status: AC
  Filled 2020-02-16: qty 5

## 2020-02-16 MED ORDER — SIMETHICONE 80 MG PO CHEW
80.0000 mg | CHEWABLE_TABLET | Freq: Three times a day (TID) | ORAL | Status: DC
  Administered 2020-02-16 – 2020-02-19 (×8): 80 mg via ORAL
  Filled 2020-02-16 (×5): qty 1

## 2020-02-16 MED ORDER — PREDNISONE 5 MG PO TABS
15.0000 mg | ORAL_TABLET | Freq: Every day | ORAL | Status: DC
Start: 2020-02-17 — End: 2020-02-17

## 2020-02-16 MED ORDER — FENTANYL CITRATE (PF) 100 MCG/2ML IJ SOLN
50.0000 ug | INTRAMUSCULAR | Status: DC
  Administered 2020-02-16: 50 ug via INTRAVENOUS
  Filled 2020-02-16: qty 2

## 2020-02-16 MED ORDER — POVIDONE-IODINE 10 % EX SWAB: 2.0000 "application " | Freq: Once | CUTANEOUS | Status: DC

## 2020-02-16 MED ORDER — MIDAZOLAM HCL 2 MG/2ML IJ SOLN
1.0000 mg | Freq: Once | INTRAMUSCULAR | Status: AC
  Administered 2020-02-16: 1 mg via INTRAVENOUS
  Filled 2020-02-16: qty 2

## 2020-02-16 MED ORDER — ONDANSETRON HCL 4 MG PO TABS: 4.0000 mg | ORAL_TABLET | Freq: Four times a day (QID) | ORAL | Status: DC | PRN

## 2020-02-16 MED ORDER — PHENYLEPHRINE HCL-NACL 10-0.9 MG/250ML-% IV SOLN
INTRAVENOUS | Status: DC | PRN
  Administered 2020-02-16: 60 ug/min via INTRAVENOUS

## 2020-02-16 MED ORDER — CYPROHEPTADINE HCL 4 MG PO TABS
8.0000 mg | ORAL_TABLET | Freq: Every evening | ORAL | Status: DC
  Administered 2020-02-16 – 2020-02-18 (×3): 8 mg via ORAL
  Filled 2020-02-16 (×4): qty 2

## 2020-02-16 MED ORDER — CALCIUM-MAGNESIUM 100-50 MG PO TABS: 1.0000 | ORAL_TABLET | Freq: Every day | ORAL | Status: DC

## 2020-02-16 MED ORDER — MUPIROCIN 2 % EX OINT
1.0000 "application " | TOPICAL_OINTMENT | Freq: Every day | CUTANEOUS | Status: DC
  Administered 2020-02-16 – 2020-02-18 (×3): 1 via TOPICAL
  Filled 2020-02-16 (×2): qty 22

## 2020-02-16 MED ORDER — ALPHA-LIPOIC ACID 600 MG PO CAPS: 600.0000 mg | ORAL_CAPSULE | Freq: Every day | ORAL | Status: DC

## 2020-02-16 MED ORDER — PANTOPRAZOLE SODIUM 40 MG PO TBEC
40.0000 mg | DELAYED_RELEASE_TABLET | Freq: Every day | ORAL | Status: DC
  Administered 2020-02-17 – 2020-02-19 (×3): 40 mg via ORAL
  Filled 2020-02-16 (×3): qty 1

## 2020-02-16 MED ORDER — ATORVASTATIN CALCIUM 10 MG PO TABS
10.0000 mg | ORAL_TABLET | Freq: Every evening | ORAL | Status: DC
  Administered 2020-02-16 – 2020-02-18 (×3): 10 mg via ORAL
  Filled 2020-02-16 (×3): qty 1

## 2020-02-16 MED ORDER — ONDANSETRON HCL 4 MG/2ML IJ SOLN
INTRAMUSCULAR | Status: DC | PRN
  Administered 2020-02-16: 4 mg via INTRAVENOUS

## 2020-02-16 MED ORDER — EPINEPHRINE 0.3 MG/0.3ML IJ SOAJ
0.3000 mg | INTRAMUSCULAR | Status: DC | PRN
  Filled 2020-02-16: qty 0.6

## 2020-02-16 MED ORDER — RISAQUAD PO CAPS
1.0000 | ORAL_CAPSULE | Freq: Every day | ORAL | Status: DC
  Administered 2020-02-17 – 2020-02-19 (×3): 1 via ORAL
  Filled 2020-02-16 (×3): qty 1

## 2020-02-16 MED ORDER — DEXAMETHASONE SODIUM PHOSPHATE 10 MG/ML IJ SOLN
INTRAMUSCULAR | Status: AC
  Filled 2020-02-16: qty 1

## 2020-02-16 MED ORDER — METHOCARBAMOL 500 MG PO TABS
500.0000 mg | ORAL_TABLET | Freq: Four times a day (QID) | ORAL | Status: DC | PRN
  Administered 2020-02-16 – 2020-02-19 (×6): 500 mg via ORAL
  Filled 2020-02-16 (×6): qty 1

## 2020-02-16 MED ORDER — ACETAMINOPHEN 500 MG PO TABS
500.0000 mg | ORAL_TABLET | Freq: Four times a day (QID) | ORAL | Status: AC
  Administered 2020-02-16 – 2020-02-17 (×4): 500 mg via ORAL
  Filled 2020-02-16 (×4): qty 1

## 2020-02-16 MED ORDER — BEANO PO TABS: 2.0000 | ORAL_TABLET | Freq: Three times a day (TID) | ORAL | Status: DC

## 2020-02-16 MED ORDER — COLCHICINE 0.6 MG PO TABS
0.6000 mg | ORAL_TABLET | ORAL | Status: DC
  Administered 2020-02-17 – 2020-02-19 (×2): 0.6 mg via ORAL
  Filled 2020-02-16 (×2): qty 1

## 2020-02-16 MED ORDER — CHLORHEXIDINE GLUCONATE 0.12 % MT SOLN
15.0000 mL | Freq: Once | OROMUCOSAL | Status: AC
  Administered 2020-02-16: 15 mL via OROMUCOSAL

## 2020-02-16 MED ORDER — 0.9 % SODIUM CHLORIDE (POUR BTL) OPTIME
TOPICAL | Status: DC | PRN
  Administered 2020-02-16: 1000 mL

## 2020-02-16 MED ORDER — GERHARDT'S BUTT CREAM: 1.0000 "application " | TOPICAL_CREAM | Freq: Every day | CUTANEOUS | Status: DC | PRN

## 2020-02-16 MED ORDER — MAGNESIUM CHLORIDE 64 MG PO TBEC
1.0000 | DELAYED_RELEASE_TABLET | Freq: Every day | ORAL | Status: DC
  Administered 2020-02-17 – 2020-02-19 (×2): 64 mg via ORAL
  Filled 2020-02-16 (×3): qty 1

## 2020-02-16 MED ORDER — BUPIVACAINE LIPOSOME 1.3 % IJ SUSP
INTRAMUSCULAR | Status: DC | PRN
  Administered 2020-02-16: 10 mL via PERINEURAL

## 2020-02-16 MED ORDER — CALCIUM CARBONATE ANTACID 500 MG PO CHEW
1.0000 | CHEWABLE_TABLET | Freq: Every day | ORAL | Status: DC
  Administered 2020-02-17 – 2020-02-19 (×3): 200 mg via ORAL
  Filled 2020-02-16 (×3): qty 1

## 2020-02-16 MED ORDER — FUROSEMIDE 20 MG PO TABS: 20.0000 mg | ORAL_TABLET | Freq: Two times a day (BID) | ORAL | Status: DC

## 2020-02-16 MED ORDER — ACETAMINOPHEN 500 MG PO TABS
1000.0000 mg | ORAL_TABLET | Freq: Once | ORAL | Status: AC
  Administered 2020-02-16: 1000 mg via ORAL
  Filled 2020-02-16: qty 2

## 2020-02-16 MED ORDER — ONDANSETRON HCL 4 MG/2ML IJ SOLN
INTRAMUSCULAR | Status: AC
  Filled 2020-02-16: qty 2

## 2020-02-16 MED ORDER — SUGAMMADEX SODIUM 200 MG/2ML IV SOLN
INTRAVENOUS | Status: DC | PRN
  Administered 2020-02-16: 300 mg via INTRAVENOUS

## 2020-02-16 MED ORDER — METHOCARBAMOL 1000 MG/10ML IJ SOLN
500.0000 mg | Freq: Four times a day (QID) | INTRAVENOUS | Status: DC | PRN
  Filled 2020-02-16: qty 5

## 2020-02-16 MED ORDER — LACTATED RINGERS IV SOLN: INTRAVENOUS | Status: DC

## 2020-02-16 MED ORDER — MONTELUKAST SODIUM 10 MG PO TABS
10.0000 mg | ORAL_TABLET | Freq: Every evening | ORAL | Status: DC
  Administered 2020-02-16 – 2020-02-18 (×3): 10 mg via ORAL
  Filled 2020-02-16 (×3): qty 1

## 2020-02-16 MED ORDER — PROPOFOL 10 MG/ML IV BOLUS
INTRAVENOUS | Status: AC
  Filled 2020-02-16: qty 20

## 2020-02-16 MED ORDER — CEFAZOLIN SODIUM-DEXTROSE 1-4 GM/50ML-% IV SOLN
1.0000 g | Freq: Four times a day (QID) | INTRAVENOUS | Status: AC
  Administered 2020-02-16 – 2020-02-17 (×3): 1 g via INTRAVENOUS
  Filled 2020-02-16 (×3): qty 50

## 2020-02-16 MED ORDER — BISACODYL 10 MG RE SUPP: 10.0000 mg | Freq: Every day | RECTAL | Status: DC | PRN

## 2020-02-16 MED ORDER — GABAPENTIN 300 MG PO CAPS
600.0000 mg | ORAL_CAPSULE | Freq: Every day | ORAL | Status: DC
  Administered 2020-02-16 – 2020-02-18 (×3): 600 mg via ORAL
  Filled 2020-02-16 (×3): qty 2

## 2020-02-16 MED ORDER — POLYVINYL ALCOHOL 1.4 % OP SOLN
1.0000 [drp] | Freq: Two times a day (BID) | OPHTHALMIC | Status: DC
  Administered 2020-02-17 – 2020-02-18 (×2): 1 [drp] via OPHTHALMIC
  Filled 2020-02-16: qty 15

## 2020-02-16 MED ORDER — MOMETASONE FURO-FORMOTEROL FUM 200-5 MCG/ACT IN AERO
2.0000 | INHALATION_SPRAY | Freq: Two times a day (BID) | RESPIRATORY_TRACT | Status: DC
  Filled 2020-02-16: qty 8.8

## 2020-02-16 MED ORDER — FUROSEMIDE 40 MG PO TABS
40.0000 mg | ORAL_TABLET | Freq: Every day | ORAL | Status: DC
Start: 2020-02-17 — End: 2020-02-19
  Administered 2020-02-17 – 2020-02-19 (×3): 40 mg via ORAL
  Filled 2020-02-16 (×3): qty 1

## 2020-02-16 MED ORDER — FLUTICASONE PROPIONATE 50 MCG/ACT NA SUSP
2.0000 | Freq: Every day | NASAL | Status: DC
  Administered 2020-02-17 – 2020-02-18 (×2): 2 via NASAL
  Filled 2020-02-16: qty 16

## 2020-02-16 MED ORDER — AMMONIUM LACTATE 12 % EX LOTN: TOPICAL_LOTION | CUTANEOUS | Status: DC | PRN

## 2020-02-16 MED ORDER — LACTASE 3000 UNITS PO TABS
18000.0000 [IU] | ORAL_TABLET | Freq: Four times a day (QID) | ORAL | Status: DC | PRN
  Filled 2020-02-16: qty 9

## 2020-02-16 MED ORDER — MIRTAZAPINE 30 MG PO TBDP
30.0000 mg | ORAL_TABLET | Freq: Every day | ORAL | Status: DC
  Administered 2020-02-16 – 2020-02-18 (×3): 30 mg via ORAL
  Filled 2020-02-16 (×3): qty 1

## 2020-02-16 MED ORDER — METOCLOPRAMIDE HCL 5 MG/ML IJ SOLN: 5.0000 mg | Freq: Three times a day (TID) | INTRAMUSCULAR | Status: DC | PRN

## 2020-02-16 MED ORDER — CEFAZOLIN SODIUM-DEXTROSE 2-4 GM/100ML-% IV SOLN
2.0000 g | INTRAVENOUS | Status: AC
  Administered 2020-02-16: 2 g via INTRAVENOUS
  Filled 2020-02-16: qty 100

## 2020-02-16 SURGICAL SUPPLY — 68 items
AID PSTN UNV HD RSTRNT DISP (MISCELLANEOUS) ×1
BAG SPEC THK2 15X12 ZIP CLS (MISCELLANEOUS) ×1
BAG ZIPLOCK 12X15 (MISCELLANEOUS) ×2 IMPLANT
BASEPLATE AUG MED W-TAPER (Plate) ×1 IMPLANT
BEARING HUMERAL SHLDER 36M STD (Shoulder) IMPLANT
BIT DRILL 2.7 W/STOP DISP (BIT) ×1 IMPLANT
BIT DRILL QUICK REL 1/8 2PK SL (DRILL) IMPLANT
BIT DRILL TWIST 2.7 (BIT) ×1 IMPLANT
BLADE SAW SAG 73X25 THK (BLADE) ×1
BLADE SAW SGTL 73X25 THK (BLADE) ×1 IMPLANT
BOOTIES KNEE HIGH SLOAN (MISCELLANEOUS) ×2 IMPLANT
BOWL SMART MIX CTS (DISPOSABLE) IMPLANT
BRNG HUM STD 36 RVRS SHLDR (Shoulder) ×1 IMPLANT
BSPLAT GLND MED AUG TPR ADPR (Plate) ×1 IMPLANT
CLSR STERI-STRIP ANTIMIC 1/2X4 (GAUZE/BANDAGES/DRESSINGS) ×2 IMPLANT
COVER BACK TABLE 60X90IN (DRAPES) ×2 IMPLANT
COVER MAYO STAND STRL (DRAPES) ×2 IMPLANT
COVER SURGICAL LIGHT HANDLE (MISCELLANEOUS) ×2 IMPLANT
COVER WAND RF STERILE (DRAPES) IMPLANT
DECANTER SPIKE VIAL GLASS SM (MISCELLANEOUS) IMPLANT
DRAPE ORTHO SPLIT 77X108 STRL (DRAPES)
DRAPE SHEET LG 3/4 BI-LAMINATE (DRAPES) ×4 IMPLANT
DRAPE SURG 17X11 SM STRL (DRAPES) ×2 IMPLANT
DRAPE SURG ORHT 6 SPLT 77X108 (DRAPES) IMPLANT
DRAPE U-SHAPE 47X51 STRL (DRAPES) ×2 IMPLANT
DRILL QUICK RELEASE 1/8 INCH (DRILL) ×2
DRSG MEPILEX BORDER 4X8 (GAUZE/BANDAGES/DRESSINGS) ×2 IMPLANT
DURAPREP 26ML APPLICATOR (WOUND CARE) ×2 IMPLANT
ELECT REM PT RETURN 15FT ADLT (MISCELLANEOUS) ×2 IMPLANT
GLENOID SPHERE STD STRL 36MM (Orthopedic Implant) ×1 IMPLANT
GLOVE BIOGEL PI IND STRL 7.0 (GLOVE) ×1 IMPLANT
GLOVE BIOGEL PI IND STRL 8 (GLOVE) ×1 IMPLANT
GLOVE BIOGEL PI INDICATOR 7.0 (GLOVE) ×1
GLOVE BIOGEL PI INDICATOR 8 (GLOVE) ×1
GLOVE ORTHO TXT STRL SZ7.5 (GLOVE) ×2 IMPLANT
GLOVE SURG ENC MOIS LTX SZ7 (GLOVE) ×2 IMPLANT
GOWN STRL REUS W/TWL 2XL LVL3 (GOWN DISPOSABLE) ×2 IMPLANT
GOWN STRL REUS W/TWL LRG LVL3 (GOWN DISPOSABLE) ×2 IMPLANT
HOOD PEEL AWAY FLYTE STAYCOOL (MISCELLANEOUS) ×4 IMPLANT
KIT BASIN OR (CUSTOM PROCEDURE TRAY) ×2 IMPLANT
KIT TURNOVER KIT A (KITS) IMPLANT
PACK SHOULDER (CUSTOM PROCEDURE TRAY) ×2 IMPLANT
PENCIL SMOKE EVACUATOR (MISCELLANEOUS) IMPLANT
PIN HUMERAL STMN 3.2MMX9IN (INSTRUMENTS) ×1 IMPLANT
PROTECTOR NERVE ULNAR (MISCELLANEOUS) ×2 IMPLANT
REAMER GUIDE BUSHING SURG DISP (MISCELLANEOUS) ×1 IMPLANT
REAMER GUIDE W/SCREW AUG (MISCELLANEOUS) ×1 IMPLANT
RESTRAINT HEAD UNIVERSAL NS (MISCELLANEOUS) ×2 IMPLANT
SCREW BONE STRL 6.5MMX25MM (Screw) ×1 IMPLANT
SCREW LOCKING 4.75MMX15MM (Screw) ×3 IMPLANT
SCREW LOCKING STRL 4.75X25X3.5 (Screw) ×1 IMPLANT
SHOULDER HUMERAL BEAR 36M STD (Shoulder) ×2 IMPLANT
SLING ARM FOAM STRAP MED (SOFTGOODS) ×1 IMPLANT
SLING ARM IMMOBILIZER LRG (SOFTGOODS) ×2 IMPLANT
STEM HUMERAL STRL 10MMX55MM (Stem) ×1 IMPLANT
SUCTION FRAZIER HANDLE 12FR (TUBING) ×2
SUCTION TUBE FRAZIER 12FR DISP (TUBING) ×1 IMPLANT
SUPPORT WRAP ARM LG (MISCELLANEOUS) ×2 IMPLANT
SUT FIBERWIRE #2 38 REV NDL BL (SUTURE) ×8
SUT VIC AB 1 CT1 36 (SUTURE) ×2 IMPLANT
SUT VIC AB 2-0 CT1 27 (SUTURE) ×2
SUT VIC AB 2-0 CT1 TAPERPNT 27 (SUTURE) ×1 IMPLANT
SUT VIC AB 3-0 SH 8-18 (SUTURE) ×2 IMPLANT
SUTURE FIBERWR#2 38 REV NDL BL (SUTURE) ×4 IMPLANT
TOWEL OR 17X26 10 PK STRL BLUE (TOWEL DISPOSABLE) ×2 IMPLANT
TOWEL OR NON WOVEN STRL DISP B (DISPOSABLE) ×2 IMPLANT
TOWER CARTRIDGE SMART MIX (DISPOSABLE) IMPLANT
TRAY HUM REV SHOULDER STD +6 (Shoulder) ×1 IMPLANT

## 2020-02-16 NOTE — Discharge Instructions (Signed)
Diet: As you were doing prior to hospitalization  ° °Shower:  May shower but keep the wounds dry, use an occlusive plastic wrap, NO SOAKING IN TUB.  If the bandage gets wet, change with a clean dry gauze.  If you have a splint on, leave the splint in place and keep the splint dry with a plastic bag. ° °Dressing:  You may change your dressing 3-5 days after surgery, unless you have a splint.  If you have a splint, then just leave the splint in place and we will change your bandages during your first follow-up appointment.   ° °If you had hand or foot surgery, we will plan to remove your stitches in about 2 weeks in the office.  For all other surgeries, there are sticky tapes (steri-strips) on your wounds and all the stitches are absorbable.  Leave the steri-strips in place when changing your dressings, they will peel off with time, usually 2-3 weeks. ° °Activity:  Increase activity slowly as tolerated, but follow the weight bearing instructions below.  The rules on driving is that you can not be taking narcotics while you drive, and you must feel in control of the vehicle.   ° °Weight Bearing:   No bearing weight with right arm ° °To prevent constipation: you may use a stool softener such as - ° °Colace (over the counter) 100 mg by mouth twice a day  °Drink plenty of fluids (prune juice may be helpful) and high fiber foods °Miralax (over the counter) for constipation as needed.   ° °Itching:  If you experience itching with your medications, try taking only a single pain pill, or even half a pain pill at a time.  You may take up to 10 pain pills per day, and you can also use benadryl over the counter for itching or also to help with sleep.  ° °Precautions:  If you experience chest pain or shortness of breath - call 911 immediately for transfer to the hospital emergency department!! ° °If you develop a fever greater that 101 F, purulent drainage from wound, increased redness or drainage from wound, or calf pain -- Call  the office at [phone]                                                °Follow- Up Appointment:  Please call for an appointment to be seen in 2 weeks [place] - [phone] ° ° °  °

## 2020-02-16 NOTE — Op Note (Signed)
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  ANESTHESIA:   General with interscalene block using Exparel  ESTIMATED BLOOD LOSS: 150 mL  UNIQUE ASPECTS OF THE CASE: There was no rotator cuff present, there was a significant hypertrophic synovial pannus diffusely around the shoulder.  The humeral head was completely collapsed, and I barely had to do a neck cut.  The glenoid was extremely small, and had significant inferior erosion.  This required augmentation placing the augment inferiorly.  PREOPERATIVE INDICATIONS:  Ebony Scott is a  69 y.o. female with a diagnosis of right shoulder avascular necrosis with collapse and severe erosion secondary to rheumatoid arthritis who failed conservative measures and elected for surgical management.  She had a previous aspiration for the concern of possible infection done last week, which was negative.  The risks benefits and alternatives were discussed with the patient preoperatively including but not limited to the risks of infection, bleeding, nerve injury, cardiopulmonary complications, the need for revision surgery, dislocation, brachial plexus palsy, incomplete relief of pain, among others, and the patient was willing to proceed.  OPERATIVE IMPLANTS: Biomet size 10 micro humeral stem press-fit with a 40 + 6 mm offset reverse shoulder arthroplasty tray with a 36 mm Vivacit-E standardy polyethylene liner and a 36 mm glenosphere set on B placed with inferior offset, with a mini baseplate and 4 locking screws and one central nonlocking screw.  Medium  augment.  OPERATIVE FINDINGS: Severe erosive rheumatoid arthritic condition, bone quality was mediocre.  The inferior glenoid was eroded, almost to the midline superiorly.  The glenoid baseplate consumed the entirety of the glenoid face.  I used a medium augment.  OPERATIVE PROCEDURE: The patient was brought to the operating room and placed in the supine position. General anesthesia was administered. IV antibiotics were given.  Time out was performed. The upper extremity was prepped and draped in usual sterile fashion. The patient was in a beachchair position. Deltopectoral approach was carried out.  The biceps was not present, and the subscapularis was also not present.  I released what was left of the capsule inferiorly to have access circumferentially.  I then dislocated the head, and then reamed with the reamer to the above named size.  I then applied the jig, and cut the humeral head in 30 of retroversion, and then turned my attention to the glenoid.  There was not much of the head left even cut.  Deep retractors were placed, and I resected the labrum, and then placed a guidepin into the center position on the glenoid, with slight inferior inclination. I then reamed over the guidepin, and this took almost no bone superiorly, and I was concerned about the depth of her vault and size of her overall bony construct.  I placed the superior augment guide, directing the augment inferiorly, and then securing it superiorly, and then placed the bushing, and reamed with the reamer, which did prepare inferior bone.    The base plate was selected and impacted place, and then I secured it centrally with a nonlocking screw, and I had excellent purchase both inferiorly and superiorly. I placed a short locking screws on  anterior and posterior aspects.  The superior screw was also fairly small.  All of them were 15 with the exception of the inferior screw which was a 25.  I used a multidirectional screw anteriorly  because I was just at the anterior rim.  I then turned my attention to the glenosphere, and impacted this into place, placing slight inferior offset (set on B).   The glenosphere was completely seated, and had engagement of the [facility] taper. I then turned my attention back to the humerus.  I sequentially broached, and then trialed, and was found to restore soft tissue tension, and it had 2 finger tightness. Therefore the above named components were selected. The shoulder felt stable throughout functional motion.  I then impacted the real prosthesis into place, as well as the real humeral tray, and reduced the shoulder. The shoulder had excellent motion, and was stable, and I irrigated the wounds copiously.   I then irrigated the shoulder copiously once more, repaired the deltopectoral interval with Vicryl followed by subcutaneous Vicryl with Steri-Strips and sterile gauze for the skin. The patient was awakened and returned back in stable and satisfactory condition. There were no complications and She tolerated the procedure well.

## 2020-02-16 NOTE — Anesthesia Postprocedure Evaluation (Signed)
Anesthesia Post Note  Patient: Ebony Scott  Procedure(s) Performed: REVERSE SHOULDER ARTHROPLASTY (Right Shoulder)     Patient location during evaluation: PACU Anesthesia Type: General Level of consciousness: awake and alert, patient cooperative and oriented Pain management: pain level controlled Vital Signs Assessment: post-procedure vital signs reviewed and stable Respiratory status: spontaneous breathing, nonlabored ventilation and respiratory function stable Cardiovascular status: blood pressure returned to baseline and stable Postop Assessment: no apparent nausea or vomiting Anesthetic complications: no   No complications documented.  Last Vitals:  Vitals:   02/16/20 1352 02/16/20 1358  BP:  137/69  Pulse:  77  Resp: (!) 27 (!) 25  Temp:    SpO2:  97%    Last Pain:  Vitals:   02/16/20 1358  TempSrc:   PainSc: 0-No pain                 Jezebelle Ledwell,E. Keddrick Wyne

## 2020-02-16 NOTE — Progress Notes (Addendum)
Pt is concerned about costs and does not want to take the hospital-provided Dulera or spiriva while here in the hospital.  Pt states she has these meds with her and prefers to take her own from home.   Dulera sent back to pharmacy per pt request.  RN aware.

## 2020-02-16 NOTE — Transfer of Care (Signed)
Immediate Anesthesia Transfer of Care Note  Patient: Ebony Scott  Procedure(s) Performed: REVERSE SHOULDER ARTHROPLASTY (Right Shoulder)  Patient Location: PACU  Anesthesia Type:General  Level of Consciousness: awake, alert  and oriented  Airway & Oxygen Therapy: Patient Spontanous Breathing and Patient connected to face mask  Post-op Assessment: Report given to RN and Post -op Vital signs reviewed and stable  Post vital signs: Reviewed and stable  Last Vitals:  Vitals Value Taken Time  BP 148/79 02/16/20 1245  Temp 36.6 C 02/16/20 1243  Pulse 82 02/16/20 1247  Resp 23 02/16/20 1247  SpO2 99 % 02/16/20 1247  Vitals shown include unvalidated device data.  Last Pain:  Vitals:   02/16/20 0731  TempSrc: Oral         Complications: No complications documented.

## 2020-02-16 NOTE — Progress Notes (Signed)
Assistedb Dr. Jairo Ben with right, ultrasound guided, interscalene  block. Side rails up, monitors on throughout procedure. See vital signs in flow sheet. Tolerated Procedure well.

## 2020-02-16 NOTE — Interval H&P Note (Signed)
History and Physical Interval Note:  02/16/2020 10:16 AM  Ebony Scott  has presented today for surgery, with the diagnosis of djd right shoulder.  The various methods of treatment have been discussed with the patient and family. After consideration of risks, benefits and other options for treatment, the patient has consented to  Procedure(s): REVERSE SHOULDER ARTHROPLASTY (Right) as a surgical intervention.  The patient's history has been reviewed, patient examined, no change in status, stable for surgery.  I have reviewed the patient's chart and labs.  Questions were answered to the patient's satisfaction.     Eulas Post

## 2020-02-16 NOTE — Anesthesia Procedure Notes (Addendum)
Anesthesia Regional Block: Interscalene brachial plexus block   Pre-Anesthetic Checklist: ,, timeout performed, Correct Patient, Correct Site, Correct Laterality, Correct Procedure, Correct Position, site marked, Risks and benefits discussed,  Surgical consent,  Pre-op evaluation,  At surgeon's request and post-op pain management  Laterality: Right and Upper  Prep: chloraprep       Needles:  Injection technique: Single-shot  Needle Type: Echogenic Needle     Needle Length: 9cm  Needle Gauge: 21     Additional Needles:   Procedures:,,,, ultrasound used (permanent image in chart),,,,  Narrative:  Start time: 02/16/2020 9:38 AM End time: 02/16/2020 9:44 AM Injection made incrementally with aspirations every 5 mL.  Performed by: Personally  Anesthesiologist: Jairo Ben, MD  Additional Notes: Pt identified in Holding room.  Monitors applied. Working IV access confirmed. Sterile prep R clavicle and neck.  #21ga ECHOgenic needle to interscalene brachial plexus with US guidance.  10cc 0.5% Bupivacaine with 1:200k epi and Exparel injected incrementally after negative test dose.  Patient asymptomatic, VSS, no heme aspirated, tolerated well.  Sandford Craze, MD

## 2020-02-16 NOTE — Anesthesia Procedure Notes (Signed)
Procedure Name: Intubation Performed by: Sudie Grumbling, CRNA Pre-anesthesia Checklist: Patient identified, Emergency Drugs available, Suction available and Patient being monitored Patient Re-evaluated:Patient Re-evaluated prior to induction Oxygen Delivery Method: Circle system utilized Preoxygenation: Pre-oxygenation with 100% oxygen Induction Type: IV induction Ventilation: Mask ventilation without difficulty and Oral airway inserted - appropriate to patient size Laryngoscope Size: Glidescope and 3 Grade View: Grade I Tube type: Oral Tube size: 7.0 mm Number of attempts: 1 Airway Equipment and Method: Stylet and Oral airway Placement Confirmation: ETT inserted through vocal cords under direct vision,  positive ETCO2 and breath sounds checked- equal and bilateral Secured at: 22 cm Tube secured with: Tape Dental Injury: Teeth and Oropharynx as per pre-operative assessment

## 2020-02-17 ENCOUNTER — Encounter (HOSPITAL_COMMUNITY): Payer: Self-pay | Admitting: Orthopedic Surgery

## 2020-02-17 LAB — CBC
HCT: 34.1 % — ABNORMAL LOW (ref 36.0–46.0)
Hemoglobin: 10.6 g/dL — ABNORMAL LOW (ref 12.0–15.0)
MCH: 34.1 pg — ABNORMAL HIGH (ref 26.0–34.0)
MCHC: 31.1 g/dL (ref 30.0–36.0)
MCV: 109.6 fL — ABNORMAL HIGH (ref 80.0–100.0)
Platelets: 159 10*3/uL (ref 150–400)
RBC: 3.11 MIL/uL — ABNORMAL LOW (ref 3.87–5.11)
RDW: 22.2 % — ABNORMAL HIGH (ref 11.5–15.5)
WBC: 13.7 10*3/uL — ABNORMAL HIGH (ref 4.0–10.5)
nRBC: 0.4 % — ABNORMAL HIGH (ref 0.0–0.2)

## 2020-02-17 LAB — BASIC METABOLIC PANEL
Anion gap: 9 (ref 5–15)
BUN: 24 mg/dL — ABNORMAL HIGH (ref 8–23)
CO2: 26 mmol/L (ref 22–32)
Calcium: 8.3 mg/dL — ABNORMAL LOW (ref 8.9–10.3)
Chloride: 105 mmol/L (ref 98–111)
Creatinine, Ser: 1.39 mg/dL — ABNORMAL HIGH (ref 0.44–1.00)
GFR, Estimated: 41 mL/min — ABNORMAL LOW (ref >60)
Glucose, Bld: 168 mg/dL — ABNORMAL HIGH (ref 70–99)
Potassium: 3.9 mmol/L (ref 3.5–5.1)
Sodium: 140 mmol/L (ref 135–145)

## 2020-02-17 MED ORDER — PREDNISONE 5 MG PO TABS
15.0000 mg | ORAL_TABLET | Freq: Every day | ORAL | Status: DC
  Administered 2020-02-18 – 2020-02-19 (×2): 15 mg via ORAL
  Filled 2020-02-17 (×2): qty 3

## 2020-02-17 NOTE — Progress Notes (Signed)
Subjective: 1 Day Post-Op s/p Procedure(s): REVERSE SHOULDER ARTHROPLASTY  Patient sitting up in bed. Denies pain in shoulder this morning. States feeling started to come back in hand last night. Has had bowel movement. Denies nausea, vomiting, abdominal pain. Does have increased SOB when getting up to use restroom, states this is baseline for her.   Objective:  PE: VITALS:   Vitals:   02/16/20 2039 02/16/20 2309 02/17/20 0204 02/17/20 0557  BP: 129/67  (!) 161/81 (!) 167/89  Pulse: 85 82 85 90  Resp: 18 16 20 20   Temp: 98.8 F (37.1 C)  97.9 F (36.6 C) 97.6 F (36.4 C)  TempSrc: Oral   Oral  SpO2: 93% 97% 95% 94%  Weight:      Height:       General: sitting up in bed, in no acute distress Resp: Clear to auscultation with decreased air movement. No crackles or wheezes Cardio: normal rate and rhythm MSK: R shoulder with mild drainage. Able to flex, extend, and abduct all fingers of right hand. Able to make a fist. Distal sensation intact. Ecchymosis at distal humerus and elbow.    LABS  Results for orders placed or performed during the hospital encounter of 02/16/20 (from the past 24 hour(s))  Aerobic/Anaerobic Culture (surgical/deep wound)     Status: None (Preliminary result)   Collection Time: 02/16/20 11:10 AM   Specimen: PATH Soft tissue  Result Value Ref Range   Specimen Description      TISSUE RIGHT HUMERUS Performed at [facility] [address], [place], [place] [address]    Special Requests      NONE Performed at [facility] [place] [address] [place], [place] [address]    Gram Stain      RARE WBC PRESENT,BOTH PMN AND MONONUCLEAR NO ORGANISMS SEEN Performed at [facility], [place] [address], [place], [place] [address]    Culture PENDING    Report Status PENDING   CBC     Status: Abnormal   Collection Time: 02/17/20  2:52 AM  Result Value Ref Range   WBC 13.7 (H) 4.0 - 10.5 K/uL   RBC 3.11 (L) 3.87 -  5.11 MIL/uL   Hemoglobin 10.6 (L) 12.0 - 15.0 g/dL   HCT 34.1 (L) 36.0 - 46.0 %   MCV 109.6 (H) 80.0 - 100.0 fL   MCH 34.1 (H) 26.0 - 34.0 pg   MCHC 31.1 30.0 - 36.0 g/dL   RDW 22.2 (H) 11.5 - 15.5 %   Platelets 159 150 - 400 K/uL   nRBC 0.4 (H) 0.0 - 0.2 %  Basic metabolic panel     Status: Abnormal   Collection Time: 02/17/20  2:52 AM  Result Value Ref Range   Sodium 140 135 - 145 mmol/L   Potassium 3.9 3.5 - 5.1 mmol/L   Chloride 105 98 - 111 mmol/L   CO2 26 22 - 32 mmol/L   Glucose, Bld 168 (H) 70 - 99 mg/dL   BUN 24 (H) 8 - 23 mg/dL   Creatinine, Ser 1.39 (H) 0.44 - 1.00 mg/dL   Calcium 8.3 (L) 8.9 - 10.3 mg/dL   GFR, Estimated 41 (L) >60 mL/min   Anion gap 9 5 - 15   *Note: Due to a large number of results and/or encounters for the requested time period, some results have not been displayed. A complete set of results can be found in Results Review.    DG Shoulder  Right Port  Result Date: 02/16/2020 CLINICAL DATA:  Status post right shoulder replacement today. EXAM: PORTABLE RIGHT SHOULDER COMPARISON:  Plain films right shoulder 12/17/2019. FINDINGS: New reverse shoulder arthroplasty is in place. The device is located. There is no fracture. Calcifications over the greater tuberosity correlate with bursal calcifications seen on prior CT. IMPRESSION: Status post right shoulder arthroplasty.  No acute finding. Electronically Signed   By: Drusilla Kanner M.D.   On: 02/16/2020 14:04   DG Foot Complete Left  Result Date: 02/15/2020 Please see detailed radiograph report in office note.  DG Foot Complete Right  Result Date: 02/15/2020 Please see detailed radiograph report in office note.   Assessment/Plan: Osteoarthritis of R shoulder 1 Day Post-Op s/p Procedure(s): REVERSE SHOULDER ARTHROPLASTY  Weightbearing: NWB RUE, keep in sling Insicional and dressing care: Reinforce dressings as needed, will plan to change to Aquacel tomorrow am Pain control: tramadol, patient  has allergies to multiple types of narcotics Follow - up plan: 2 weeks with Dr. Dion Saucier Dispo: likely tomorrow if able to pass OT today  History of DVT: - no TXA used, continue home Xarelto  Rheumatoid Arthritis: - on home prednisone, will plan to restart biologic as per rheumatology after discharge  Tracheobronchomalacia, asthma: - patient is going to use home Dulera and Spiriva due to cost of inpatient meds - nebulizer prn - albuterol prn - montelukast - home CPAP at night  Hypercholesterolemia: - home atorvastatin   Contact information:   Weekdays 8-5 Janine Ores, PA-C [phone] A fter hours and holidays please check [email or web address] for group call information for [facility]  Armida Sans 02/17/2020, 8:48 AM

## 2020-02-17 NOTE — Plan of Care (Signed)
  Problem: Education: Goal: Knowledge of General Education information will improve Description: Including pain rating scale, medication(s)/side effects and non-pharmacologic comfort measures Outcome: Progressing   Problem: Health Behavior/Discharge Planning: Goal: Ability to manage health-related needs will improve Outcome: Progressing   Problem: Activity: Goal: Risk for activity intolerance will decrease Outcome: Progressing   

## 2020-02-17 NOTE — Plan of Care (Signed)
  Problem: Education: Goal: Knowledge of General Education information will improve Description: Including pain rating scale, medication(s)/side effects and non-pharmacologic comfort measures Outcome: Progressing   Problem: Clinical Measurements: Goal: Respiratory complications will improve Outcome: Progressing   Problem: Activity: Goal: Risk for activity intolerance will decrease Outcome: Progressing   

## 2020-02-17 NOTE — Evaluation (Signed)
Occupational Therapy Evaluation Patient Details Name: Ebony Scott MRN: [id number] DOB: [birth year] Today's Date: 02/17/2020    History of Present Illness S/p right reverse shoulder arthroplasty   Clinical Impression   Mrs. Ebony Scott is a 69 year old woman s/p s/p shoulder replacement without functional use of right dominant upper extremity secondary to effects of surgery and interscalene block and shoulder precautions. Therapist provided education and instruction to patient and daghter in regards to exercises, precautions, positioning, donning upper extremity clothing and bathing while maintaining shoulder precautions, ice and edema management and donning/doffing sling. Patient and daughter verbalized understanding.Patient not going home today and did not perform dressing activity. Will see patient while in hospital to reiterate precautions and improve patient's ability to perform ADLs while adhering to UE precautions. Patient plans to discharge home with the assistance of son and daughter.      Follow Up Recommendations  Follow surgeon's recommendation for DC plan and follow-up therapies    Equipment Recommendations  None recommended by OT    Recommendations for Other Services       Precautions / Restrictions Precautions Precautions: Shoulder Type of Shoulder Precautions: No AROM, NO PROM Shoulder Interventions: Shoulder sling/immobilizer;At all times;Off for dressing/bathing/exercises Precaution Booklet Issued: Yes (comment) (Handout) Required Braces or Orthoses: Sling Restrictions Weight Bearing Restrictions: Yes RUE Weight Bearing: Non weight bearing      Mobility Bed Mobility               General bed mobility comments: Patient sitting on side of bed    Transfers Overall transfer level: Needs assistance Equipment used: None Transfers: Sit to/from Stand;Stand Pivot Transfers Sit to Stand: Min guard Stand pivot transfers: Min guard       General transfer  comment: Min guard to ambulate around the bed. One mild posterior LOB that patient was able to correct. Reports a muscle injury in leg recently that has effected her balance and ambulation.    Balance                                           ADL either performed or assessed with clinical judgement   ADL Overall ADL's : Needs assistance/impaired Eating/Feeding: Set up   Grooming: Min guard;Standing   Upper Body Bathing: Minimal assistance;Sitting   Lower Body Bathing: Minimal assistance;Sit to/from stand   Upper Body Dressing : Moderate assistance;Sitting   Lower Body Dressing: Minimal assistance;Sit to/from stand   Toilet Transfer: Min guard;Ambulation   Toileting- Clothing Manipulation and Hygiene: Min guard;Sit to/from stand               Vision Patient Visual Report: No change from baseline       Perception     Praxis      Pertinent Vitals/Pain Pain Assessment: No/denies pain     Hand Dominance Right   Extremity/Trunk Assessment Upper Extremity Assessment Upper Extremity Assessment: RUE deficits/detail RUE Deficits / Details: Non functional use of RUE due to shoulder precautions and block. Continues to exhibit decreased elbow and forearm ROM.   Lower Extremity Assessment Lower Extremity Assessment: Overall WFL for tasks assessed   Cervical / Trunk Assessment Cervical / Trunk Assessment: Normal   Communication Communication Communication: No difficulties   Cognition Arousal/Alertness: Awake/alert Behavior During Therapy: WFL for tasks assessed/performed Overall Cognitive Status: Within Functional Limits for tasks assessed  General Comments       Exercises     Shoulder Instructions Shoulder Instructions Donning/doffing shirt without moving shoulder: Moderate assistance Method for sponge bathing under operated UE: Minimal assistance Donning/doffing sling/immobilizer: Moderate  assistance Correct positioning of sling/immobilizer: Independent ROM for elbow, wrist and digits of operated UE: Independent;Patient able to independently direct caregiver Sling wearing schedule (on at all times/off for ADL's): Independent Proper positioning of operated UE when showering: Independent Dressing change: Independent Positioning of UE while sleeping: Independent    Home Living Family/patient expects to be discharged to:: Private residence Living Arrangements: Alone Available Help at Discharge: Family;Available PRN/intermittently Type of Home: House Home Access: Stairs to enter     Home Layout: Two level;Other (Comment) (basement down to washer and dryer)     Bathroom Shower/Tub: Producer, television/film/video: Standard Bathroom Accessibility: Yes   Home Equipment: Toilet riser;Shower seat   Additional Comments: Patient has daughter and son lined up to assist the first couple of days. Potentially HH aide trying to be lined up as well??      Prior Functioning/Environment Level of Independence: Independent                 OT Problem List: Decreased strength;Decreased range of motion;Impaired balance (sitting and/or standing);Pain;Impaired UE functional use      OT Treatment/Interventions: Self-care/ADL training;Therapeutic exercise;DME and/or AE instruction;Patient/family education;Balance training;Therapeutic activities    OT Goals(Current goals can be found in the care plan section) Acute Rehab OT Goals Patient Stated Goal: Succesful recovery of shoulder OT Goal Formulation: With patient Time For Goal Achievement: 02/26/20 Potential to Achieve Goals: Good  OT Frequency: Min 2X/week   Barriers to D/C:            Co-evaluation              AM-PAC OT "6 Clicks" Daily Activity     Outcome Measure Help from another person eating meals?: A Little Help from another person taking care of personal grooming?: A Little Help from another person  toileting, which includes using toliet, bedpan, or urinal?: A Little Help from another person bathing (including washing, rinsing, drying)?: A Little Help from another person to put on and taking off regular upper body clothing?: A Lot Help from another person to put on and taking off regular lower body clothing?: A Little 6 Click Score: 17   End of Session Nurse Communication: Mobility status  Activity Tolerance: Patient tolerated treatment well Patient left: in chair;with call bell/phone within reach  OT Visit Diagnosis: Unsteadiness on feet (R26.81);Pain Pain - Right/Left: Right Pain - part of body: Shoulder                Time: 3149-7026 OT Time Calculation (min): 26 min Charges:  OT General Charges $OT Visit: 1 Visit OT Evaluation $OT Eval Low Complexity: 1 Low OT Treatments $Self Care/Home Management : 8-22 mins  Edmar Blankenburg, OTR/L Acute Care Rehab Services  Office [phone] Pager: [phone]   Kelli Churn 02/17/2020, 11:25 AM

## 2020-02-18 ENCOUNTER — Ambulatory Visit: Payer: Medicare PPO | Admitting: Podiatry

## 2020-02-18 ENCOUNTER — Inpatient Hospital Stay (HOSPITAL_COMMUNITY): Payer: Medicare PPO

## 2020-02-18 DIAGNOSIS — D72829 Elevated white blood cell count, unspecified: Secondary | ICD-10-CM

## 2020-02-18 DIAGNOSIS — E876 Hypokalemia: Secondary | ICD-10-CM

## 2020-02-18 DIAGNOSIS — R0600 Dyspnea, unspecified: Secondary | ICD-10-CM | POA: Diagnosis not present

## 2020-02-18 DIAGNOSIS — R0609 Other forms of dyspnea: Secondary | ICD-10-CM

## 2020-02-18 DIAGNOSIS — M19011 Primary osteoarthritis, right shoulder: Secondary | ICD-10-CM

## 2020-02-18 LAB — CBC
HCT: 31.7 % — ABNORMAL LOW (ref 36.0–46.0)
Hemoglobin: 10 g/dL — ABNORMAL LOW (ref 12.0–15.0)
MCH: 34.4 pg — ABNORMAL HIGH (ref 26.0–34.0)
MCHC: 31.5 g/dL (ref 30.0–36.0)
MCV: 108.9 fL — ABNORMAL HIGH (ref 80.0–100.0)
Platelets: 142 10*3/uL — ABNORMAL LOW (ref 150–400)
RBC: 2.91 MIL/uL — ABNORMAL LOW (ref 3.87–5.11)
RDW: 22 % — ABNORMAL HIGH (ref 11.5–15.5)
WBC: 11.2 10*3/uL — ABNORMAL HIGH (ref 4.0–10.5)
nRBC: 0.7 % — ABNORMAL HIGH (ref 0.0–0.2)

## 2020-02-18 LAB — BASIC METABOLIC PANEL
Anion gap: 13 (ref 5–15)
BUN: 29 mg/dL — ABNORMAL HIGH (ref 8–23)
CO2: 25 mmol/L (ref 22–32)
Calcium: 7.8 mg/dL — ABNORMAL LOW (ref 8.9–10.3)
Chloride: 100 mmol/L (ref 98–111)
Creatinine, Ser: 1.71 mg/dL — ABNORMAL HIGH (ref 0.44–1.00)
GFR, Estimated: 32 mL/min — ABNORMAL LOW (ref >60)
Glucose, Bld: 182 mg/dL — ABNORMAL HIGH (ref 70–99)
Potassium: 2.8 mmol/L — ABNORMAL LOW (ref 3.5–5.1)
Sodium: 138 mmol/L (ref 135–145)

## 2020-02-18 LAB — MAGNESIUM: Magnesium: 1.9 mg/dL (ref 1.7–2.4)

## 2020-02-18 MED ORDER — POTASSIUM CHLORIDE CRYS ER 20 MEQ PO TBCR
40.0000 meq | EXTENDED_RELEASE_TABLET | Freq: Once | ORAL | Status: AC
  Administered 2020-02-18: 40 meq via ORAL
  Filled 2020-02-18: qty 2

## 2020-02-18 MED ORDER — POTASSIUM CHLORIDE 10 MEQ/100ML IV SOLN
10.0000 meq | INTRAVENOUS | Status: AC
  Administered 2020-02-18 (×2): 10 meq via INTRAVENOUS
  Filled 2020-02-18 (×2): qty 100

## 2020-02-18 NOTE — Plan of Care (Signed)
  Problem: Education: Goal: Knowledge of General Education information will improve Description Including pain rating scale, medication(s)/side effects and non-pharmacologic comfort measures Outcome: Progressing   

## 2020-02-18 NOTE — TOC Progression Note (Signed)
Transition of Care [facility]) - Progression Note    Patient Details  Name: Ebony Scott MRN: [id number] Date of Birth: [birth year]  [facility]) CM/SW Contact  Clearance Coots, LCSW Phone Number: 02/18/2020, 11:22 AM  Clinical Narrative:    Patient had questions about Home Health vs. Private Care givers. CSW had questions about [facility] (privare are service?) CSW reached out to [facility] (private care program) on behalf of the patient. Staff member Ryan to follow up with the patient.   Patient arranged private care through [facility]. She reports they will complete a home screening today or tomorrow after she completes the application. Patient reports she will have support from her daughter and son who both plan to stay with her until caregivers are in place. CSW notified the patient she does not need skilled home health order for private caregivers at home. Patient reports understanding.     Barriers to Discharge: No Barriers Identified  Expected Discharge Plan and Services                                                 Social Determinants of Health (SDOH) Interventions    Readmission Risk Interventions No flowsheet data found.

## 2020-02-18 NOTE — Progress Notes (Signed)
Occupational Therapy Treatment Patient Details Name: Ebony Scott MRN: [id number] DOB: [birth year] Today's Date: 02/18/2020    History of present illness S/p right reverse shoulder arthroplasty   OT comments  Patient provided with reiteration of all instructions and education. Patient demonstrated ability to don UB clothing and sling with increased time with instruction during task. Patient needed assistance to don socks and pants and therapist instructed patient on compensatory strategies for dressing at home. Patient verbalized understanding of all education and instruction during ADLs. Patient provided with handout to maximize retention of education, instructions and shoulder precautions .   Follow Up Recommendations  Follow surgeon's recommendation for DC plan and follow-up therapies    Equipment Recommendations  None recommended by OT    Recommendations for Other Services      Precautions / Restrictions Precautions Precautions: Shoulder Type of Shoulder Precautions: No AROM, NO PROM Shoulder Interventions: Shoulder sling/immobilizer;At all times;Off for dressing/bathing/exercises Precaution Booklet Issued: Yes (comment) Required Braces or Orthoses: Sling Restrictions Weight Bearing Restrictions: Yes RUE Weight Bearing: Non weight bearing       Mobility Bed Mobility Overal bed mobility: Modified Independent                Transfers Overall transfer level: Needs assistance Equipment used: None             General transfer comment: min guard for safety but no physical assistance    Balance Overall balance assessment: Mild deficits observed, not formally tested                                         ADL either performed or assessed with clinical judgement   ADL   Eating/Feeding: Modified independent   Grooming: Modified independent;Standing Grooming Details (indicate cue type and reason): stood at sink to wash hands with min guard    Upper Body Bathing Details (indicate cue type and reason): verbalized understanding of technique     Upper Body Dressing : Supervision/safety;Adhering to UE precautions Upper Body Dressing Details (indicate cue type and reason): verbal cues for instructions on adhering to shoulder precautions with donning tank top and button up shirt. Also donned sling standing at sink in bathroom. Lower Body Dressing: Moderate assistance;+2 for safety/equipment Lower Body Dressing Details (indicate cue type and reason): Increased time to pull pants up. Mod assist as she needed assistance to pull up pants over feet due to cuffed type pant and to  don socks. Able to don slip on shoes. Recommended loose clothing to ease task. Toilet Transfer: Min guard;Ambulation   Toileting- Clothing Manipulation and Hygiene: Min guard;Sit to/from stand Toileting - Clothing Manipulation Details (indicate cue type and reason): increased time, min guard for safety             Vision Patient Visual Report: No change from baseline     Perception     Praxis      Cognition Arousal/Alertness: Awake/alert Behavior During Therapy: WFL for tasks assessed/performed Overall Cognitive Status: Within Functional Limits for tasks assessed                                          Exercises     Shoulder Instructions Shoulder Instructions Donning/doffing shirt without moving shoulder: Independent Method for sponge bathing under operated UE: Independent Donning/doffing  sling/immobilizer: Independent Correct positioning of sling/immobilizer: Independent ROM for elbow, wrist and digits of operated UE: Independent Sling wearing schedule (on at all times/off for ADL's): Independent Proper positioning of operated UE when showering: Independent Dressing change: Independent Positioning of UE while sleeping: Independent     General Comments      Pertinent Vitals/ Pain       Pain Assessment: 0-10 Pain  Descriptors / Indicators: Aching;Sore Pain Intervention(s): Limited activity within patient's tolerance;Monitored during session;RN gave pain meds during session  Home Living                                          Prior Functioning/Environment              Frequency           Progress Toward Goals  OT Goals(current goals can now be found in the care plan section)  Progress towards OT goals: Goals met and updated - see care plan  Acute Rehab OT Goals Patient Stated Goal: Succesful recovery of shoulder OT Goal Formulation: With patient Time For Goal Achievement: 02/26/20 Potential to Achieve Goals: Good  Plan All goals met and education completed, patient discharged from OT services    Co-evaluation                 AM-PAC OT "6 Clicks" Daily Activity     Outcome Measure   Help from another person eating meals?: None Help from another person taking care of personal grooming?: None Help from another person toileting, which includes using toliet, bedpan, or urinal?: None Help from another person bathing (including washing, rinsing, drying)?: None Help from another person to put on and taking off regular upper body clothing?: A Little Help from another person to put on and taking off regular lower body clothing?: A Lot 6 Click Score: 21    End of Session    OT Visit Diagnosis: Unsteadiness on feet (R26.81);Pain Pain - Right/Left: Right Pain - part of body: Shoulder   Activity Tolerance Patient tolerated treatment well   Patient Left in chair;with call bell/phone within reach;with chair alarm set   Nurse Communication  (Ot education complete)        Time: 8159-4707 OT Time Calculation (min): 31 min  Charges: OT General Charges $OT Visit: 1 Visit OT Treatments $Self Care/Home Management : 23-37 mins  Sui Kasparek, OTR/L [place]  Office [phone] Pager: [place] 02/18/2020, 11:07  AM

## 2020-02-18 NOTE — Consult Note (Addendum)
Medical Consultation   Ebony Scott  IOE:[id number]  DOB: Jan 21, 1951  DOA: 02/16/2020  PCP: [name], MD    Requesting physician: Dr. Dion Saucier  Reason for consultation: Shortness of breath with exertion   History of Present Illness: This is a 69 y.o. 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  She has failed outpatient conservative measures.  Patient underwent a right reverse total shoulder arthroplasty on 10/28 with Dr. Dion Saucier.  Yesterday, the patient noted shortness of breath when getting up to use the restroom.  States that she chronically has shortness of breath on exertion but this has been worsening over the past 6 months.  States she recently had a stress test which was unremarkable and follows with cardiology.  Has an upcoming cardiology appointment on 02/29/2020.  Also follows with pulmonology.  States that over the past 6 months or so she has had decreased activity level due to her shoulder pain and is pretty much sedentary throughout the day.  She become short of breath with 1 flight of stairs.  Admits to chronic lower extremity edema and some central chest tightness on exertion but no pain which resolves with rest. Denies any wheezing but has had a minimally productive cough over the past day or so.  Denies any fever or chills or any other issues.   Review of Systems:  Review of Systems  All other systems reviewed and are negative.  As per HPI otherwise 10 point review of systems negative.     Past Medical History: Past Medical History:  Diagnosis Date  . Acute deep vein thrombosis (DVT) of right lower  extremity (HCC) 12/13/2017  . Allergic rhinitis   . Allergy    SEASONAL  . Anemia   . Anxiety    pt denies  . Arthritis    Phreesia 10/21/2019  . Asthma   . Asthma    Phreesia 10/21/2019  . Breast cancer (HCC) 1998   in remission  . Cataract    REMOVED  . Clostridium difficile colitis 01/14/2018  . Clostridium difficile diarrhea 01/14/2018  . Complication of anesthesia    "had hard time waking up from it several times" (02/20/2012)  . Depression    "some; don't take anything for it" (02/20/2012)  . Diverticulosis   . DVT (deep venous thrombosis) (HCC)   . Exertional dyspnea   . Fibromyalgia 11/2011  . GERD (gastroesophageal reflux disease)   . Graves disease   . Headache(784.0)    "related to allergies; more at different times during the year" (02/20/2012)  . Hemorrhoids   . Hiatal hernia    back and neck  . Hx of adenomatous colonic polyps 04/12/2016  . Hypercholesteremia    good cholesterol is high  . Hypothyroidism   . IBS (irritable bowel syndrome)   . Moderate persistent asthma    -FeV1 72% 2011, -IgE 102 2011, CT sinus Neg 2011  . Osteoporosis    on reclast yearly  . Peripheral vascular disease (HCC) 2019   DVTs  . Pneumonia 04/2011; ~ 11/2011   "double; single" (02/20/2012)  . Seronegative rheumatoid arthritis (HCC)    Dr. Casimer Lanius  .  SIRS (systemic inflammatory response syndrome) ([place]) 02/10/2018  . Tracheobronchomalacia     Past Surgical History: Past Surgical History:  Procedure Laterality Date  . ABDOMINAL HYSTERECTOMY N/A    Phreesia 10/21/2019  . ANTERIOR AND POSTERIOR REPAIR  1990's  . APPENDECTOMY    . BREAST LUMPECTOMY  1998   left  . BREAST SURGERY N/A    Phreesia 10/21/2019  . CARPOMETACARPEL ([place]) FUSION OF THUMB WITH AUTOGRAFT FROM RADIUS  ~ 2009   "both thumbs" (02/20/2012)  . CATARACT EXTRACTION W/ INTRAOCULAR LENS  IMPLANT, BILATERAL  2012  . CERVICAL DISCECTOMY  10/2001   C5-C6  . CERVICAL FUSION  2003   C3-C4  . CHOLECYSTECTOMY     . COLONOSCOPY    . DEBRIDEMENT TENNIS ELBOW  ?1970's   right  . ESOPHAGOGASTRODUODENOSCOPY    . EYE SURGERY N/A    Phreesia 10/21/2019  . HYSTERECTOMY    . KNEE ARTHROPLASTY  ?1990's   "?right; w/cartilage repair" (02/20/2012)  . NASAL SEPTUM SURGERY  1980's  . POSTERIOR CERVICAL FUSION/FORAMINOTOMY  2004   "failed initial fusion; rewired  anterior neck" (02/20/2012)  . REVERSE SHOULDER ARTHROPLASTY Right 02/16/2020   Procedure: REVERSE SHOULDER ARTHROPLASTY;  Surgeon: Marchia Bond, MD;  Location: [facility];  Service: Orthopedics;  Laterality: Right;  . SPINE SURGERY N/A    Phreesia 10/21/2019  . TONSILLECTOMY  ~ 1953  . VESICOVAGINAL FISTULA CLOSURE W/ TAH  1988  . VIDEO BRONCHOSCOPY Bilateral 08/23/2016   Procedure: VIDEO BRONCHOSCOPY WITH FLUORO;  Surgeon: Javier Glazier, MD;  Location: [facility];  Service: Cardiopulmonary;  Laterality: Bilateral;     Allergies:   Allergies  Allergen Reactions  . Dust Mite Extract Shortness Of Breath and Other (See Comments)    "sneezing" (02/20/2012)  . Molds & Smuts Shortness Of Breath  . Morphine And Related Hives and Itching  . Other Shortness Of Breath and Other (See Comments)    Grass and weeds "sneezing; filled sinuses" (02/20/2012)  . Penicillins Rash and Other (See Comments)    "welts" (02/20/2012) Has patient had a PCN reaction causing immediate rash, facial/tongue/throat swelling, SOB or lightheadedness with hypotension: Unknown Has patient had a PCN reaction causing severe rash involving mucus membranes or skin necrosis: No Has patient had a PCN reaction that required hospitalization: No Has patient had a PCN reaction occurring within the last 10 years: No If all of the above answers are "NO", then may proceed with Cephalosporin use. Tolerated Ancef 02/16/20.   Marland Kitchen Reclast [Zoledronic Acid] Other (See Comments)    Fever, Put in hospital, dr said it was a reaction from a reaction   . Rofecoxib Swelling    Vioxx REACTION: feet  swelling  . Shellfish Allergy Anaphylaxis, Shortness Of Breath, Itching and Rash  . Tetracycline Hcl Nausea And Vomiting  . Xolair [Omalizumab] Other (See Comments)    Caused Blood clot  . Dilaudid [Hydromorphone Hcl] Itching  . Hydrocodone-Acetaminophen Nausea And Vomiting  . Levofloxacin Other (See Comments)    GI upset  . Oxycodone Hcl Nausea And Vomiting  . Paroxetine Nausea And Vomiting    Paxil   . Celecoxib Swelling    Feet swelling  . Diltiazem Swelling  . Lactose Intolerance (Gi) Other (See Comments)    Bloating and gas  . Tree Extract Other (See Comments)    "tested and told I was allergic to it; never experienced a reaction to it" (02/20/2012)     Social History:  reports that she is a non-smoker but  has been exposed to tobacco smoke. She has never used smokeless tobacco. She reports previous alcohol use. She reports that she does not use drugs.   Family History: Family History  Problem Relation Age of Onset  . Allergies Mother   . Heart disease Mother   . Arthritis Mother   . Lung cancer Mother   . Diabetes Mother   . Allergies Father   . Heart disease Father   . Arthritis Father   . Stroke Father   . Colon cancer Other        Maternal half aunt/Maternal half uncle  . Colitis Daughter   . Diabetes Maternal Grandfather      Physical Exam: Vitals:   02/17/20 2035 02/17/20 2318 02/18/20 0517 02/18/20 1348  BP: 133/67 (!) 145/76 (!) 159/80 (!) 143/82  Pulse: (!) 101 98 87 (!) 106  Resp: 20 20 19 18   Temp: 98.6 F (37 C) 98.3 F (36.8 C) 98.2 F (36.8 C) 98.1 F (36.7 C)  TempSrc: Oral Oral  Oral  SpO2: 94% 96% 99% 98%  Weight:      Height:        Physical Exam Vitals and nursing note reviewed.  Constitutional:      Appearance: Normal appearance.  HENT:     Head: Normocephalic and atraumatic.  Eyes:     Conjunctiva/sclera: Conjunctivae normal.  Cardiovascular:     Rate and Rhythm: Normal rate and regular rhythm.  Pulmonary:     Effort:  Pulmonary effort is normal.     Breath sounds: Examination of the right-lower field reveals rales. Examination of the left-lower field reveals rales. Rales present.  Abdominal:     General: Abdomen is flat.     Palpations: Abdomen is soft.  Musculoskeletal:     Right lower leg: Edema present.     Left lower leg: Edema present.     Comments: 2+ bilateral lower extremity edema Right arm in sling  Skin:    Coloration: Skin is not jaundiced or pale.  Neurological:     Mental Status: She is alert. Mental status is at baseline.  Psychiatric:        Mood and Affect: Mood normal.        Behavior: Behavior normal.       Data reviewed:  I have personally reviewed following labs and imaging studies Labs:  CBC: Recent Labs  Lab 02/17/20 0252 02/18/20 0320  WBC 13.7* 11.2*  HGB 10.6* 10.0*  HCT 34.1* 31.7*  MCV 109.6* 108.9*  PLT 159 142*    Basic Metabolic Panel: Recent Labs  Lab 02/17/20 0252 02/18/20 0700  NA 140 138  K 3.9 2.8*  CL 105 100  CO2 26 25  GLUCOSE 168* 182*  BUN 24* 29*  CREATININE 1.39* 1.71*  CALCIUM 8.3* 7.8*   GFR Estimated Creatinine Clearance: 26.5 mL/min (A) (by C-G formula based on SCr of 1.71 mg/dL (H)). Liver Function Tests: No results for input(s): AST, ALT, ALKPHOS, BILITOT, PROT, ALBUMIN in the last 168 hours. No results for input(s): LIPASE, AMYLASE in the last 168 hours. No results for input(s): AMMONIA in the last 168 hours. Coagulation profile No results for input(s): INR, PROTIME in the last 168 hours.  Cardiac Enzymes: No results for input(s): CKTOTAL, CKMB, CKMBINDEX, TROPONINI in the last 168 hours. BNP: Invalid input(s): POCBNP CBG: No results for input(s): GLUCAP in the last 168 hours. D-Dimer No results for input(s): DDIMER in the last 72 hours. Hgb A1c No results for input(s):  HGBA1C in the last 72 hours. Lipid Profile No results for input(s): CHOL, HDL, LDLCALC, TRIG, CHOLHDL, LDLDIRECT in the last 72 hours. Thyroid  function studies No results for input(s): TSH, T4TOTAL, T3FREE, THYROIDAB in the last 72 hours.  Invalid input(s): FREET3 Anemia work up No results for input(s): VITAMINB12, FOLATE, FERRITIN, TIBC, IRON, RETICCTPCT in the last 72 hours. Urinalysis    Component Value Date/Time   COLORURINE YELLOW 05/15/2019 0911   APPEARANCEUR CLEAR 05/15/2019 0911   LABSPEC 1.020 05/15/2019 0911   PHURINE 6.5 05/15/2019 0911   GLUCOSEU NEGATIVE 05/15/2019 0911   HGBUR NEGATIVE 05/15/2019 0911   BILIRUBINUR NEGATIVE 03/06/2018 1526   KETONESUR NEGATIVE 05/15/2019 0911   PROTEINUR TRACE (A) 05/15/2019 0911   UROBILINOGEN 0.2 05/17/2014 1113   NITRITE NEGATIVE 05/15/2019 0911   LEUKOCYTESUR NEGATIVE 05/15/2019 0911     Microbiology Recent Results (from the past 240 hour(s))  Surgical pcr screen     Status: None   Collection Time: 02/10/20 10:53 AM   Specimen: Nasal Mucosa; Nasal Swab  Result Value Ref Range Status   MRSA, PCR NEGATIVE NEGATIVE Final   Staphylococcus aureus NEGATIVE NEGATIVE Final    Comment: (NOTE) The Xpert SA Assay (FDA approved for NASAL specimens in patients 41 years of age and older), is one component of a comprehensive surveillance program. It is not intended to diagnose infection nor to guide or monitor treatment. Performed at [facility], [place] [address], [place], [place] [address]   SARS CORONAVIRUS 2 (TAT 6-24 HRS) Nasopharyngeal Nasopharyngeal Swab     Status: None   Collection Time: 02/15/20  1:23 PM   Specimen: Nasopharyngeal Swab  Result Value Ref Range Status   SARS Coronavirus 2 NEGATIVE NEGATIVE Final    Comment: (NOTE) SARS-CoV-2 target nucleic acids are NOT DETECTED.  The SARS-CoV-2 RNA is generally detectable in upper and lower respiratory specimens during the acute phase of infection. Negative results do not preclude SARS-CoV-2 infection, do not rule out co-infections with other pathogens, and should not be used as the sole  basis for treatment or other patient management decisions. Negative results must be combined with clinical observations, patient history, and epidemiological information. The expected result is Negative.  Fact Sheet for Patients: SugarRoll.be  Fact Sheet for Healthcare Providers: https://www.woods-mathews.com/  This test is not yet approved or cleared by the [place] FDA and  has been authorized for detection and/or diagnosis of SARS-CoV-2 by FDA under an Emergency Use Authorization (EUA). This EUA will remain  in effect (meaning this test can be used) for the duration of the COVID-19 declaration under Se ction 564(b)(1) of the Act, 21 U.S.C. section 360bbb-3(b)(1), unless the authorization is terminated or revoked sooner.  Performed at [facility], [place] [address], [place], [place] [address]   Aerobic/Anaerobic Culture (surgical/deep wound)     Status: None (Preliminary result)   Collection Time: 02/16/20 11:10 AM   Specimen: PATH Soft tissue  Result Value Ref Range Status   Specimen Description   Final    TISSUE RIGHT HUMERUS Performed at [place] [address] [place],  [address]    Special Requests   Final    NONE Performed at [facility], [place] [address] [place], [place] [address]    Gram Stain   Final    RARE WBC PRESENT,BOTH PMN AND MONONUCLEAR NO ORGANISMS SEEN    Culture   Final    NO GROWTH 2 DAYS NO ANAEROBES ISOLATED; CULTURE  IN PROGRESS FOR 5 DAYS Performed at [facility], [place] [address], [place], [place] [address]    Report Status PENDING  Incomplete       Inpatient Medications:   Scheduled Meds: . acidophilus  1 capsule Oral Daily  . allopurinol  100 mg Oral Daily  . atorvastatin  10 mg Oral QPM  . calcium carbonate  1 tablet Oral Daily   And  . magnesium chloride  1 tablet Oral Daily  . cholecalciferol  1,000 Units Oral Daily  .  colchicine  0.6 mg Oral QODAY  . cyproheptadine  8 mg Oral QPM  . famotidine  20 mg Oral BID  . fluticasone  2 spray Each Nare Daily  . folic acid  1 mg Oral Daily  . furosemide  40 mg Oral Daily   And  . furosemide  20 mg Oral QPM  . gabapentin  600 mg Oral QHS  . loratadine  10 mg Oral Daily  . mirtazapine  30 mg Oral QHS  . montelukast  10 mg Oral QPM  . mupirocin ointment  1 application Topical QHS  . nystatin  5 mL Mouth/Throat QHS  . pantoprazole  40 mg Oral Daily  . polyvinyl alcohol  1 drop Both Eyes BID  . potassium chloride  40 mEq Oral Once  . predniSONE  15 mg Oral Q breakfast  . rivaroxaban  20 mg Oral QPM  . simethicone  80 mg Oral TID with meals   Continuous Infusions: . methocarbamol (ROBAXIN) IV    . potassium chloride       Radiological Exams on Admission: DG Chest 2 View  Result Date: 02/18/2020 CLINICAL DATA:  Shortness of breath EXAM: CHEST - 2 VIEW COMPARISON:  CT chest dated 08/28/2019 FINDINGS: Mild right basilar atelectasis. Left lung is clear. No pleural effusion or pneumothorax. Mild eventration of the right hemidiaphragm. Heart is normal in size. Right shoulder arthroplasty. Healing left posterolateral 6th and 8th rib fractures. Postprocedural changes in the left breast. Cholecystectomy clips. IMPRESSION: Mild right basilar atelectasis. Healing left rib fractures. Electronically Signed   By: Julian Hy M.D.   On: 02/18/2020 13:28    Impression/Recommendations Principal Problem:   Osteoarthritis of right shoulder Active Problems:   S/P reverse total shoulder arthroplasty, left   DOE (dyspnea on exertion)    1. Dyspnea on exertion, Likely multifactorial: Mostly debility and atelectasis with contribution of chronic asthma, tracheobronchomalacia and chronic diastolic heart failure a. Currently tolerating room air. CXR with atelectasis b. Incentive spirometry-educated on proper use at bedside c. Flutter valve d. PT eval e. Ambulatory pulse  ox f. Continue as needed albuterol g. Continue home Dulera and Spiriva due to cost of inpatient meds. Continue home Singulair h. Continue Lasix p.o. i. Check EKG j. Close follow-up with outpatient cardiology and pulmonology  2. Hypokalemia a. Replete p.o. and IV b. Check magnesium  3. Leukocytosis a. Likely reactive to surgery and is improving  4. Right shoulder osteoarthritis s/p reverse shoulder arthroplasty on 02/16/2020 with Dr. Mardelle Matte a. Per primary team  5. Rheumatoid arthritis a. On home prednisone b. Outpatient rheumatology follow-up  6. History of recurrent DVTs a. Continue Xarelto   Thank you for this consultation.  Our [facility] hospitalist team will follow the patient with you.   Time Spent: 80 minutes  Harold Hedge D.O.  [facility] 02/18/2020, 2:29 PM

## 2020-02-18 NOTE — Progress Notes (Addendum)
Subjective: 2 Days Post-Op s/p Procedure(s): REVERSE SHOULDER ARTHROPLASTY  Increased pain today as block wears off, though well controlled with tramadol. Has had bowel movement and is passing flatus. Denies chest pain, nausea, vomiting, abdominal pain. Shortness of breath with activity at baseline, continues to have SOB when getting up to use the restroom yesterday.   Objective:  PE: VITALS:   Vitals:   02/17/20 1255 02/17/20 2035 02/17/20 2318 02/18/20 0517  BP: (!) 159/75 133/67 (!) 145/76 (!) 159/80  Pulse: 90 (!) 101 98 87  Resp: 18 20 20 19   Temp:  98.6 F (37 C) 98.3 F (36.8 C) 98.2 F (36.8 C)  TempSrc:  Oral Oral   SpO2: 96% 94% 96% 99%  Weight:      Height:       General: sitting up in bed, in no acute distress Resp: Clear to auscultation with decreased air movement. No crackles or wheezes Cardio: normal rate and rhythm GI: Abdomen mildly distended. Non tender, normal bowel sounds. MSK: R shoulder with mild drainage. Able to flex, extend, and abduct all fingers of right hand. Able to make a fist. Full ROM right wrist Distal sensation intact. Ecchymosis at distal humerus and elbow.   LABS  Results for orders placed or performed during the hospital encounter of 02/16/20 (from the past 24 hour(s))  CBC     Status: Abnormal   Collection Time: 02/18/20  3:20 AM  Result Value Ref Range   WBC 11.2 (H) 4.0 - 10.5 K/uL   RBC 2.91 (L) 3.87 - 5.11 MIL/uL   Hemoglobin 10.0 (L) 12.0 - 15.0 g/dL   HCT 02/20/20 (L) 21.3 - 08.6 %   MCV 108.9 (H) 80.0 - 100.0 fL   MCH 34.4 (H) 26.0 - 34.0 pg   MCHC 31.5 30.0 - 36.0 g/dL   RDW 57.8 (H) 46.9 - 62.9 %   Platelets 142 (L) 150 - 400 K/uL   nRBC 0.7 (H) 0.0 - 0.2 %   *Note: Due to a large number of results and/or encounters for the requested time period, some results have not been displayed. A complete set of results can be found in Results Review.    DG Shoulder Right Port  Result Date: 02/16/2020 CLINICAL DATA:  Status  post right shoulder replacement today. EXAM: PORTABLE RIGHT SHOULDER COMPARISON:  Plain films right shoulder 12/17/2019. FINDINGS: New reverse shoulder arthroplasty is in place. The device is located. There is no fracture. Calcifications over the greater tuberosity correlate with bursal calcifications seen on prior CT. IMPRESSION: Status post right shoulder arthroplasty.  No acute finding. Electronically Signed   By: 12/19/2019 M.D.   On: 02/16/2020 14:04    Assessment/Plan: R shoulder osteoarthritis: 2 Days Post-Op s/p Procedure(s): REVERSE SHOULDER ARTHROPLASTY Weightbearing: NWB RUE, keep in sling Insicional and dressing care: changed to Aquacel this am, reinforce as needed Pain control: tramadol, tylenol, robaxin, home neurontin - patient has allergies to multiple types of narcotics Follow - up plan: 2 weeks with Dr. 02/18/2020  Leukocytosis: - likely due to surgery, trending down today  Acute blood loss anemia: - Hbg remains at 10.0 will continue to watch  Tracheobronchomalacia, asthma: - patient is using home Dulera and Spiriva due to cost of inpatient meds - nebulizer prn - albuterol prn - montelukast - home CPAP at night  Allergic rhinitis: - prn azelastine, periactin, loratidine, Flonase, prn mucinex,   Rheumatoid Arthritis: - on home prednisone, will plan to restart biologic as per rheumatology  after discharge  Gout: - maintenance allopurinol, colchicine  Lower extremity edema: - home lasix   History of DVT: - no TXA used, continue home Xarelto  Hypercholesterolemia: - home atorvastatin  Dispo: Plan to work with OT today, possible discharge tomorrrow   Contact information:   Weekdays 8-5 [address], [place] [phone] A fter hours and holidays please check [email or web address] for group call information for [facility]  Ventura Bruns 02/18/2020, 7:02 AM

## 2020-02-19 DIAGNOSIS — M069 Rheumatoid arthritis, unspecified: Secondary | ICD-10-CM

## 2020-02-19 DIAGNOSIS — R0600 Dyspnea, unspecified: Secondary | ICD-10-CM | POA: Diagnosis not present

## 2020-02-19 DIAGNOSIS — Z96611 Presence of right artificial shoulder joint: Secondary | ICD-10-CM | POA: Diagnosis not present

## 2020-02-19 DIAGNOSIS — E876 Hypokalemia: Secondary | ICD-10-CM

## 2020-02-19 DIAGNOSIS — M19011 Primary osteoarthritis, right shoulder: Secondary | ICD-10-CM | POA: Diagnosis not present

## 2020-02-19 DIAGNOSIS — I825Y1 Chronic embolism and thrombosis of unspecified deep veins of right proximal lower extremity: Secondary | ICD-10-CM

## 2020-02-19 LAB — COMPREHENSIVE METABOLIC PANEL
ALT: 86 U/L — ABNORMAL HIGH (ref 0–44)
AST: 56 U/L — ABNORMAL HIGH (ref 15–41)
Albumin: 3.1 g/dL — ABNORMAL LOW (ref 3.5–5.0)
Alkaline Phosphatase: 39 U/L (ref 38–126)
Anion gap: 11 (ref 5–15)
BUN: 31 mg/dL — ABNORMAL HIGH (ref 8–23)
CO2: 25 mmol/L (ref 22–32)
Calcium: 8.1 mg/dL — ABNORMAL LOW (ref 8.9–10.3)
Chloride: 103 mmol/L (ref 98–111)
Creatinine, Ser: 1.26 mg/dL — ABNORMAL HIGH (ref 0.44–1.00)
GFR, Estimated: 46 mL/min — ABNORMAL LOW (ref >60)
Glucose, Bld: 173 mg/dL — ABNORMAL HIGH (ref 70–99)
Potassium: 3.6 mmol/L (ref 3.5–5.1)
Sodium: 139 mmol/L (ref 135–145)
Total Bilirubin: 1 mg/dL (ref 0.3–1.2)
Total Protein: 5.6 g/dL — ABNORMAL LOW (ref 6.5–8.1)

## 2020-02-19 LAB — CBC
HCT: 30.5 % — ABNORMAL LOW (ref 36.0–46.0)
Hemoglobin: 9.8 g/dL — ABNORMAL LOW (ref 12.0–15.0)
MCH: 34.3 pg — ABNORMAL HIGH (ref 26.0–34.0)
MCHC: 32.1 g/dL (ref 30.0–36.0)
MCV: 106.6 fL — ABNORMAL HIGH (ref 80.0–100.0)
Platelets: 143 10*3/uL — ABNORMAL LOW (ref 150–400)
RBC: 2.86 MIL/uL — ABNORMAL LOW (ref 3.87–5.11)
RDW: 21.7 % — ABNORMAL HIGH (ref 11.5–15.5)
WBC: 11.5 10*3/uL — ABNORMAL HIGH (ref 4.0–10.5)
nRBC: 0.7 % — ABNORMAL HIGH (ref 0.0–0.2)

## 2020-02-19 LAB — MAGNESIUM: Magnesium: 1.8 mg/dL (ref 1.7–2.4)

## 2020-02-19 MED ORDER — BACLOFEN 10 MG PO TABS: 10.0000 mg | ORAL_TABLET | Freq: Three times a day (TID) | ORAL | 0 refills | Status: DC

## 2020-02-19 MED ORDER — POTASSIUM CHLORIDE CRYS ER 20 MEQ PO TBCR
40.0000 meq | EXTENDED_RELEASE_TABLET | Freq: Once | ORAL | Status: AC
  Administered 2020-02-19: 40 meq via ORAL
  Filled 2020-02-19: qty 2

## 2020-02-19 MED ORDER — FLUCONAZOLE 100 MG PO TABS: 100.0000 mg | ORAL_TABLET | Freq: Every day | ORAL | 0 refills | Status: DC

## 2020-02-19 MED ORDER — TRAMADOL HCL 50 MG PO TABS: 50.0000 mg | ORAL_TABLET | Freq: Four times a day (QID) | ORAL | 0 refills | Status: DC | PRN

## 2020-02-19 MED ORDER — ONDANSETRON HCL 4 MG PO TABS: 4.0000 mg | ORAL_TABLET | Freq: Three times a day (TID) | ORAL | 0 refills | Status: DC | PRN

## 2020-02-19 MED ORDER — MAGNESIUM SULFATE 2 GM/50ML IV SOLN
2.0000 g | Freq: Once | INTRAVENOUS | Status: AC
  Administered 2020-02-19: 2 g via INTRAVENOUS
  Filled 2020-02-19: qty 50

## 2020-02-19 NOTE — Discharge Summary (Signed)
Discharge Summary  Patient ID: RHEANN BOEHNLEIN MRN: [id number] DOB/AGE: [birth year] 71 y.o.  Admit date: 02/16/2020 Discharge date: 02/19/2020  Admission Diagnoses:  Osteoarthritis of right shoulder  Discharge Diagnoses:  Principal Problem:   Osteoarthritis of right shoulder Active Problems:   S/P reverse total shoulder arthroplasty, left   DOE (dyspnea on exertion)   Past Medical History:  Diagnosis Date  . Acute deep vein thrombosis (DVT) of right lower extremity ([place]) 12/13/2017  . Allergic rhinitis   . Allergy    SEASONAL  . Anemia   . Anxiety    pt denies  . Arthritis    Phreesia 10/21/2019  . Asthma   . Asthma    Phreesia 10/21/2019  . Breast cancer ([place]) 1998   in remission  . Cataract    REMOVED  . Clostridium difficile colitis 01/14/2018  . Clostridium difficile diarrhea 01/14/2018  . Complication of anesthesia    "had hard time waking up from it several times" (02/20/2012)  . Depression    "some; don't take anything for it" (02/20/2012)  . Diverticulosis   . DVT (deep venous thrombosis) ([place])   . Exertional dyspnea   . Fibromyalgia 11/2011  . GERD (gastroesophageal reflux disease)   . Graves disease   . Headache(784.0)    "related to allergies; more at different times during the year" (02/20/2012)  . Hemorrhoids   . Hiatal hernia    back and neck  . Hx of adenomatous colonic polyps 04/12/2016  . Hypercholesteremia    good cholesterol is high  . Hypothyroidism   . IBS (irritable bowel syndrome)   . Moderate persistent asthma    -FeV1 72% 2011, -IgE 102 2011, CT sinus Neg 2011  . Osteoporosis    on reclast yearly  . Peripheral vascular disease ([place]) 2019   DVTs  . Pneumonia 04/2011; ~ 11/2011   "double; single" (02/20/2012)  . Seronegative rheumatoid arthritis ([place])    Dr. Lahoma Rocker  . SIRS (systemic inflammatory response syndrome) ([place]) 02/10/2018  . Tracheobronchomalacia     Surgeries: Procedure(s): REVERSE SHOULDER ARTHROPLASTY on 02/16/2020    Consultants (if any): Treatment Team:  Eugenie Filler, MD  Discharged Condition: Improved  Hospital Course: WARDELL MULREADY is an 69 y.o. female who was admitted 02/16/2020 with a diagnosis of Osteoarthritis of right shoulder and went to the operating room on 02/16/2020 and underwent the above named procedures.  She had moderate dyspnea on exertion and hypokalemia post operatively. Chest x-ray showed atelectasis. Patient increased incentive spirometry use and was able to ambulate with PT on room air without shortness of breath above baseline. Hypokalemia was repleted prior to discharge.   She was given perioperative antibiotics:  Anti-infectives (From admission, onward)   Start     Dose/Rate Route Frequency Ordered Stop   02/19/20 0000  fluconazole (DIFLUCAN) 100 MG tablet        100 mg Oral Daily 02/19/20 1350 02/26/20 2359   02/16/20 1600  ceFAZolin (ANCEF) IVPB 1 g/50 mL premix        1 g 100 mL/hr over 30 Minutes Intravenous Every 6 hours 02/16/20 1419 02/17/20 0507   02/16/20 0730  ceFAZolin (ANCEF) IVPB 2g/100 mL premix        2 g 200 mL/hr over 30 Minutes Intravenous On call to O.R. 02/16/20 0725 02/16/20 1031    .  She was given sequential compression devices, early ambulation, and restarted her home Xarelto for DVT prophylaxis.  She benefited maximally from the hospital stay.  Recent vital signs:  Vitals:   02/18/20 2347 02/19/20 0629  BP:  139/65  Pulse: 97 87  Resp: 20 18  Temp:  98.2 F (36.8 C)  SpO2: 97% 96%    Recent laboratory studies:  Lab Results  Component Value Date   HGB 9.8 (L) 02/19/2020   HGB 10.0 (L) 02/18/2020   HGB 10.6 (L) 02/17/2020   Lab Results  Component Value Date   WBC 11.5 (H) 02/19/2020   PLT 143 (L) 02/19/2020   Lab Results  Component Value Date   INR 2.22 03/07/2018   Lab Results  Component Value Date   NA 139 02/19/2020   K 3.6 02/19/2020   CL 103 02/19/2020   CO2 25 02/19/2020   BUN 31 (H) 02/19/2020   CREATININE  1.26 (H) 02/19/2020   GLUCOSE 173 (H) 02/19/2020    Discharge Medications:   Allergies as of 02/19/2020      Reactions   Dust Mite Extract Shortness Of Breath, Other (See Comments)   "sneezing" (02/20/2012)   Molds & Smuts Shortness Of Breath   Morphine And Related Hives, Itching   Other Shortness Of Breath, Other (See Comments)   Grass and weeds "sneezing; filled sinuses" (02/20/2012)   Penicillins Rash, Other (See Comments)   "welts" (02/20/2012) Has patient had a PCN reaction causing immediate rash, facial/tongue/throat swelling, SOB or lightheadedness with hypotension: Unknown Has patient had a PCN reaction causing severe rash involving mucus membranes or skin necrosis: No Has patient had a PCN reaction that required hospitalization: No Has patient had a PCN reaction occurring within the last 10 years: No If all of the above answers are "NO", then may proceed with Cephalosporin use. Tolerated Ancef 02/16/20.   Reclast [zoledronic Acid] Other (See Comments)   Fever, Put in hospital, dr said it was a reaction from a reaction    Rofecoxib Swelling   Vioxx REACTION: feet swelling   Shellfish Allergy Anaphylaxis, Shortness Of Breath, Itching, Rash   Tetracycline Hcl Nausea And Vomiting   Xolair [omalizumab] Other (See Comments)   Caused Blood clot   Dilaudid [hydromorphone Hcl] Itching   Hydrocodone-acetaminophen Nausea And Vomiting   Levofloxacin Other (See Comments)   GI upset   Oxycodone Hcl Nausea And Vomiting   Paroxetine Nausea And Vomiting   Paxil   Celecoxib Swelling   Feet swelling   Diltiazem Swelling   Lactose Intolerance (gi) Other (See Comments)   Bloating and gas   Tree Extract Other (See Comments)   "tested and told I was allergic to it; never experienced a reaction to it" (02/20/2012)      Medication List    STOP taking these medications   ACTEMRA IV   cephALEXin 500 MG capsule Commonly known as: KEFLEX   rifaximin 550 MG Tabs tablet Commonly known as:  XIFAXAN   sulfamethoxazole-trimethoprim 800-160 MG tablet Commonly known as: BACTRIM DS     TAKE these medications   acetaminophen 650 MG CR tablet Commonly known as: TYLENOL Take 1,300 mg by mouth 2 (two) times daily.   AeroChamber Plus inhaler Use as instructed   albuterol 108 (90 Base) MCG/ACT inhaler Commonly known as: ProAir HFA Inhale 2 puffs into the lungs every 4 (four) hours as needed for wheezing or shortness of breath.   albuterol (2.5 MG/3ML) 0.083% nebulizer solution Commonly known as: PROVENTIL Take 3 mLs (2.5 mg total) by nebulization in the morning and at bedtime. Along with flutter valve   ALIGN PO Take 1 capsule by mouth  daily.   allopurinol 100 MG tablet Commonly known as: ZYLOPRIM Take 100 mg by mouth daily.   Alpha-Lipoic Acid 600 MG Caps Take 600 mg by mouth daily.   ammonium lactate 12 % cream Commonly known as: AMLACTIN Apply topically as needed for dry skin. What changed:   how much to take  when to take this   atorvastatin 10 MG tablet Commonly known as: LIPITOR TAKE 1 TABLET BY MOUTH DAILY   azelastine 0.1 % nasal spray Commonly known as: ASTELIN USE 2 SPRAYS IN EACH NOSTRIL DAILY AS DIRECTED What changed: See the new instructions.   baclofen 10 MG tablet Commonly known as: LIORESAL Take 1 tablet (10 mg total) by mouth 3 (three) times daily. As needed for muscle spasm   BEANO PO Take 2-3 capsules by mouth 3 (three) times daily with meals.   CALCIUM-MAGNESIUM PO Take 1 tablet by mouth 2 (two) times daily.   cetirizine 10 MG tablet Commonly known as: ZYRTEC Take 10 mg by mouth daily.   cholecalciferol 25 MCG (1000 UNIT) tablet Commonly known as: VITAMIN D Take 1,000 Units by mouth daily.   cyproheptadine 4 MG tablet Commonly known as: PERIACTIN TAKE 2 TABLETS BY MOUTH EVERY EVENING   denosumab 60 MG/ML Soln injection Commonly known as: PROLIA Inject 60 mg into the skin every 6 (six) months. Administer in upper arm,  thigh, or abdomen   Dexilant 60 MG capsule Generic drug: dexlansoprazole TAKE 1 CAPSULE BY MOUTH EACH MORNING What changed: See the new instructions.   diclofenac Sodium 1 % Gel Commonly known as: VOLTAREN APPLY 2 GRAMS TOPICALLY TWICE DAILY What changed:   how much to take  how to take this  when to take this  additional instructions   Dulera 200-5 MCG/ACT Aero Generic drug: mometasone-formoterol INHALE 2 PUFFS BY MOUTH TWICE DAILY   EPINEPHrine 0.3 mg/0.3 mL Soaj injection Commonly known as: EpiPen 2-Pak Inject 0.3 mLs (0.3 mg total) into the muscle Once PRN. What changed:   when to take this  reasons to take this   famotidine 20 MG tablet Commonly known as: PEPCID TAKE 1 TABLET BY MOUTH TWICE A DAY   fluconazole 100 MG tablet Commonly known as: Diflucan Take 1 tablet (100 mg total) by mouth daily for 7 days.   fluticasone 50 MCG/ACT nasal spray Commonly known as: FLONASE Place 2 sprays into both nostrils daily.   Flutter Devi Use as directed   folic acid 1 MG tablet Commonly known as: FOLVITE Take 1 mg by mouth daily.   furosemide 40 MG tablet Commonly known as: LASIX TAKE 1 TABLET BY MOUTH DAILY AS NEEDED What changed:   how much to take  when to take this  additional instructions   gabapentin 600 MG tablet Commonly known as: NEURONTIN TAKE 1 TABLET BY MOUTH DAILY   Gerhardt's butt cream Crea Apply 1 application topically daily as needed (hemorrhoids). NYSTATIN 100,000 UNIT/GM CR 100000 CRM- 60 g, ZINC OXIDE 20 % OINT 20 OINT- 60 g, HYDROCORTISONE 1 % CREAM- 60 g   guaiFENesin 600 MG 12 hr tablet Commonly known as: MUCINEX Take 600 mg by mouth 2 (two) times daily.   hydrocortisone cream 0.5 % APPLY TO THE AFFECTED AREA(S) AS DIRECTED   KERASAL EX Apply 1 application topically at bedtime.   Lactase 9000 units Tabs Take 18,000-27,000 Units by mouth 4 (four) times daily as needed (dairy consumption).   leflunomide 20 MG  tablet Commonly known as: ARAVA Take 20 mg by mouth  daily.   lidocaine 5 % Commonly known as: LIDODERM Place 1 patch onto the skin daily as needed (pain). Remove & Discard patch within 12 hours or as directed by MD   magnesium oxide 400 MG tablet Commonly known as: MAG-OX Take 400 mg by mouth at bedtime.   mirtazapine 30 MG disintegrating tablet Commonly known as: REMERON SOL-TAB DISSOLVE 1 TABLET BY MOUTH EVERY NIGHT AT BEDTIME What changed: See the new instructions.   Mitigare 0.6 MG Caps Generic drug: Colchicine Take 0.6 mg by mouth every other day.   montelukast 10 MG tablet Commonly known as: SINGULAIR TAKE 1 TABLET BY MOUTH DAILY What changed: when to take this   mupirocin ointment 2 % Commonly known as: BACTROBAN Apply 1 application topically 2 (two) times daily. What changed: when to take this   nystatin 100000 UNIT/ML suspension Commonly known as: MYCOSTATIN SWISH AND SPIT 5 MLS TWICE DAILY What changed:   how much to take  how to take this  when to take this  additional instructions   nystatin-triamcinolone ointment Commonly known as: MYCOLOG Apply topically 2 (two) times daily. What changed:   how much to take  when to take this  reasons to take this   ondansetron 4 MG tablet Commonly known as: Zofran Take 1 tablet (4 mg total) by mouth every 8 (eight) hours as needed for nausea or vomiting.   predniSONE 5 MG tablet Commonly known as: DELTASONE Take 15 mg by mouth daily with breakfast.   prenatal multivitamin Tabs tablet Take 1 tablet by mouth daily.   sodium chloride HYPERTONIC 3 % nebulizer solution Take by nebulization 2 (two) times daily. What changed: how much to take   Spiriva Respimat 2.5 MCG/ACT Aers Generic drug: Tiotropium Bromide Monohydrate Inhale 1 puff into the lungs daily.   Systane Complete 0.6 % Soln Generic drug: Propylene Glycol Place 1 drop into both eyes 2 (two) times daily.   traMADol 50 MG tablet Commonly  known as: Ultram Take 1 tablet (50 mg total) by mouth every 6 (six) hours as needed.   Xarelto 20 MG Tabs tablet Generic drug: rivaroxaban TAKE 1 TABLET BY MOUTH DAILY WITH SUPPER What changed: See the new instructions.       Diagnostic Studies: DG Chest 2 View  Result Date: 02/18/2020 CLINICAL DATA:  Shortness of breath EXAM: CHEST - 2 VIEW COMPARISON:  CT chest dated 08/28/2019 FINDINGS: Mild right basilar atelectasis. Left lung is clear. No pleural effusion or pneumothorax. Mild eventration of the right hemidiaphragm. Heart is normal in size. Right shoulder arthroplasty. Healing left posterolateral 6th and 8th rib fractures. Postprocedural changes in the left breast. Cholecystectomy clips. IMPRESSION: Mild right basilar atelectasis. Healing left rib fractures. Electronically Signed   By: Julian Hy M.D.   On: 02/18/2020 13:28   CT SHOULDER RIGHT WO CONTRAST  Result Date: 02/09/2020 CLINICAL DATA:  Right shoulder pain, limited range of motion. History of breast cancer. History of rheumatoid arthritis. EXAM: CT OF THE UPPER RIGHT EXTREMITY WITHOUT CONTRAST TECHNIQUE: Multidetector CT imaging of the upper right extremity was performed according to the standard protocol. COMPARISON:  X-ray 12/17/2019, MRI 01/13/2020.  Chest CT 08/28/2019 FINDINGS: Bones/Joint/Cartilage Marked bone loss of the humeral head with smooth bony remodeling. Loss of approximately 50% of the bony substance of the humeral head including the full surface area of the articular surface. There is also bone loss of the inferior glenoid involving at least 25% of the inferior glenoid articular surface. Very large glenohumeral joint  effusion with innumerable small mineralized densities. Collection tracks along the posterior margin of the scapula. Approximate measurements of the collection measure at least 13 x 7 x 7 cm. There is a curvilinear fragment of bone within the axillary pouch measuring 2.0 x 0.5 cm. No evidence of an  acute fracture or dislocation. Mild degenerative changes of the acromioclavicular joint. There is fluid distension of the subacromial-subdeltoid bursal space also containing numerous small mineralized densities. Remaining visualized osseous structures appear grossly intact. Ligaments Suboptimally assessed by CT. Muscles and Tendons Fatty atrophy of the subscapularis muscle. Mild fatty infiltration within the supraspinatus muscle. Known rotator cuff tears are not well demonstrated by CT. Soft tissues No axillary lymphadenopathy. Irregular pleural based pulmonary nodule in the lateral aspect of the right upper lobe measuring approximately 8 mm (series 3, image 59). This is new from prior CT 08/28/2019. Scattered surrounding tree-in-bud opacities are present within this area. IMPRESSION: 1. Destructive arthropathy of the glenohumeral joint with rapid bone loss involving the majority of the humeral head and inferior aspect of the glenoid. Findings may reflect a crystalline arthropathy such as [place] shoulder, inflammatory arthropathy such as RA given the patient's history, neuropathic joint, or less likely septic arthritis. Arthrocentesis with joint fluid analysis is recommended. 2. Very large glenohumeral joint effusion with innumerable small mineralized densities. Fluid distension of the subacromial-subdeltoid bursal space also containing numerous small mineralized densities. 3. Interval development of irregular pleural based nodule in the periphery of the right upper lobe measuring 8 mm. Non-contrast chest CT at 6-12 months is recommended. If the nodule is stable at time of repeat CT, then future CT at 18-24 months (from today's scan) is considered optional for low-risk patients, but is recommended for high-risk patients. This recommendation follows the consensus statement: Guidelines for Management of Incidental Pulmonary Nodules Detected on CT Images: From the Fleischner Society 2017; Radiology 2017;  284:228-243. These results will be called to the ordering clinician or representative by the Radiologist Assistant, and communication documented in the PACS or [employer]. Electronically Signed   By: Davina Poke D.O.   On: 02/09/2020 08:58   DG Shoulder Right Port  Result Date: 02/16/2020 CLINICAL DATA:  Status post right shoulder replacement today. EXAM: PORTABLE RIGHT SHOULDER COMPARISON:  Plain films right shoulder 12/17/2019. FINDINGS: New reverse shoulder arthroplasty is in place. The device is located. There is no fracture. Calcifications over the greater tuberosity correlate with bursal calcifications seen on prior CT. IMPRESSION: Status post right shoulder arthroplasty.  No acute finding. Electronically Signed   By: Inge Rise M.D.   On: 02/16/2020 14:04   DG Foot Complete Left  Result Date: 02/15/2020 Please see detailed radiograph report in office note.  DG Foot Complete Right  Result Date: 02/15/2020 Please see detailed radiograph report in office note.   Disposition: Discharge disposition: 01-Home or Self Care          Follow-up Information    Marchia Bond, MD. Schedule an appointment as soon as possible for a visit in 2 weeks.   Specialty: Orthopedic Surgery Contact information: [place] [address] [phone]        [name], MD. Schedule an appointment as soon as possible for a visit in 2 week(s).   Specialty: Family Medicine Contact information: [address] [place] [address] [phone]        Buford Dresser, MD .   Specialty: Cardiology Contact information: [address] [place]  G[place], Laura P, DO. Schedule an appointment as soon as possible for a visit in 2 week(s).   Specialty: Pulmonary Disease Contact information: [place] [address] [phone]        Care, [facility] Follow up.   Specialty: [place] Why: to provide home health physical therapy Contact information: [address] [place] [address] [phone]                Signed: Ventura Bruns PA-C 02/19/2020, 1:53 PM

## 2020-02-19 NOTE — Progress Notes (Signed)
Subjective: 3 Days Post-Op s/p Procedure(s): REVERSE SHOULDER ARTHROPLASTY   States pain is moderate this am, but tramadol is generally helping pain. Denies nausea, vomiting, abdominal pain, chest pain. States she has coughed up mucus throughout the day yesterday and this morning. Has had a few loose stool yesterday but feels these have improved since stopping colace.   Objective:  PE: VITALS:   Vitals:   02/18/20 1348 02/18/20 2217 02/18/20 2347 02/19/20 0629  BP: (!) 143/82 131/75  139/65  Pulse: (!) 106 93 97 87  Resp: 18 18 20 18   Temp: 98.1 F (36.7 C) 98.8 F (37.1 C)  98.2 F (36.8 C)  TempSrc: Oral Oral    SpO2: 98% 96% 97% 96%  Weight:      Height:       General: sitting up in bed eating breakfast, in no acute distress Resp: Clear to auscultation, No crackles or wheezes. Cardio: normal rate and rhythm GI: Abdomen mildly distended. Non tender, normal bowel sounds. MSK:R shoulder with mild drainage. Able to flex, extend, and abduct all fingers of right hand. Able to make a fist. Full ROM right wrist. Distal sensation intact. Ecchymosis at distal humerus and elbow.Bilateral lower extremity edema. Increased edema at left hand.   LABS  Results for orders placed or performed during the hospital encounter of 02/16/20 (from the past 24 hour(s))  CBC     Status: Abnormal   Collection Time: 02/19/20  8:17 AM  Result Value Ref Range   WBC 11.5 (H) 4.0 - 10.5 K/uL   RBC 2.86 (L) 3.87 - 5.11 MIL/uL   Hemoglobin 9.8 (L) 12.0 - 15.0 g/dL   HCT 30.5 (L) 36.0 - 46.0 %   MCV 106.6 (H) 80.0 - 100.0 fL   MCH 34.3 (H) 26.0 - 34.0 pg   MCHC 32.1 30.0 - 36.0 g/dL   RDW 21.7 (H) 11.5 - 15.5 %   Platelets 143 (L) 150 - 400 K/uL   nRBC 0.7 (H) 0.0 - 0.2 %   *Note: Due to a large number of results and/or encounters for the requested time period, some results have not been displayed. A complete set of results can be found in Results Review.    DG Chest 2 View  Result Date:  02/18/2020 CLINICAL DATA:  Shortness of breath EXAM: CHEST - 2 VIEW COMPARISON:  CT chest dated 08/28/2019 FINDINGS: Mild right basilar atelectasis. Left lung is clear. No pleural effusion or pneumothorax. Mild eventration of the right hemidiaphragm. Heart is normal in size. Right shoulder arthroplasty. Healing left posterolateral 6th and 8th rib fractures. Postprocedural changes in the left breast. Cholecystectomy clips. IMPRESSION: Mild right basilar atelectasis. Healing left rib fractures. Electronically Signed   By: Julian Hy M.D.   On: 02/18/2020 13:28    Assessment/Plan: Right shoulder osteoarthritis 3 Days Post-Op s/p Procedure(s): REVERSE SHOULDER ARTHROPLASTY Weightbearing:NWBRUE, keep in sling Insicional and dressing care: reinforce as needed Pain control:tramadol, tylenol, robaxin, home neurontin - patient has allergies to multiple types of narcotics Follow - up plan:2 weekswith Dr. Mardelle Matte  Leukocytosis: - likely due to surgery, trending down yesterday, repeat CBC ordered this AM  Acute blood loss anemia: - repeat CBC ordered this morning  Hypokalemia: - repletion started yesterday, repeat CMP ordered  Tracheobronchomalacia, asthma, dyspnea on exertion: - appreciate medicine input  Allergic rhinitis: - prn azelastine, periactin, loratidine, Flonase, prn mucinex,   Rheumatoid Arthritis: - on home prednisone, will plan to restart biologic as per rheumatology after discharge  Gout: - maintenance allopurinol, colchicine  Lower extremity edema: - home lasix   History of DVT: - continue home Xarelto  Hypercholesterolemia: - home atorvastatin  Dispo: PT eval pending today, pending labs this am, okay to discharge from orthopedic standpoint, will speak with medicine team regarding their thoughts on her cardiopulmonary status regarding discharge   Contact information:   Weekdays 8-5 Janine Ores, PA-C [phone] A fter hours and holidays please  check [email or web address] for group call information for [facility]  Armida Sans 02/19/2020, 8:55 AM

## 2020-02-19 NOTE — Progress Notes (Addendum)
Patient notes pain with swallowing and tenderness of bilateral tonsils. Unable to see the back of patients mouth due to size. According to patient, she has long history of candidiasis following antibiotic use. Will give diflucan Rx to patient upon discharge as this has worked well for her in the past, recommend that she follows up with her PCP or pulmonologist shortly after discharge is symptoms worsen.

## 2020-02-19 NOTE — Evaluation (Signed)
Physical Therapy Evaluation Patient Details Name: Ebony Scott MRN: [id number] DOB: 06-01-1950 Today's Date: 02/19/2020   History of Present Illness  69 y.o. female with a past medical history of RLE DVT on Xarelto, asthma and tracheobronchomalacia on CPAP nightly and follows with pulmonology, breast cancer, depression, GERD, hypothyroidism, IBS, hyperlipidemia, osteoporosis, HFpEF, LVH, rheumatoid arthritis on chronic steroids, PVD.   Patient underwent a right reverse total shoulder arthroplasty on 10/28 with Dr. Mardelle Matte.  Clinical Impression  Pt admitted with above diagnosis.  Pt currently with functional limitations due to the deficits listed below (see PT Problem List). Pt will benefit from skilled PT to increase their independence and safety with mobility to allow discharge to the venue listed below.  Pt ambulated around unit and reports mild dyspnea but happy to be mobilizing outside of room.  Pt does present with some gait abnormalities which she reports as baseline however also has had a fall recently due to not clearing her foot from the floor, so she would benefit from f/u HHPT.  Pt reports she has family support/assist upon d/c.  Pt agreeable to HHPT and anticipates d/c home soon.      Follow Up Recommendations Home health PT    Equipment Recommendations  None recommended by PT    Recommendations for Other Services       Precautions / Restrictions Precautions Precautions: Shoulder Type of Shoulder Precautions: No AROM, NO PROM Shoulder Interventions: Shoulder sling/immobilizer;At all times;Off for dressing/bathing/exercises Required Braces or Orthoses: Sling Restrictions Weight Bearing Restrictions: Yes RUE Weight Bearing: Non weight bearing      Mobility  Bed Mobility Overal bed mobility: Modified Independent                  Transfers Overall transfer level: Needs assistance Equipment used: None Transfers: Sit to/from Stand Sit to Stand: Min guard          General transfer comment: min guard for safety but no physical assistance  Ambulation/Gait Ambulation/Gait assistance: Min guard Gait Distance (Feet): 400 Feet Assistive device: None Gait Pattern/deviations: Step-through pattern;Wide base of support;Decreased stance time - left;Antalgic Gait velocity: variable   General Gait Details: pt with increased stance time on right and increased bil trunk sway with ambulation however reports this is close to her baseline, denies pain, variable pace however pt returned to slower pace when self aware of uncontrolled forward momentum  Stairs            Wheelchair Mobility    Modified Rankin (Stroke Patients Only)       Balance Overall balance assessment: History of Falls;Mild deficits observed, not formally tested                                           Pertinent Vitals/Pain Pain Assessment: No/denies pain    Home Living Family/patient expects to be discharged to:: Private residence Living Arrangements: Alone Available Help at Discharge: Family;Available PRN/intermittently Type of Home: House Home Access: Stairs to enter     Home Layout: Two level;Other (Comment) (basement down to washer and dryer) Home Equipment: Toilet riser;Shower seat Additional Comments: Patient has daughter and son lined up to assist the first couple of days. Potentially [place] aide trying to be lined up as well??    Prior Function Level of Independence: Independent               Hand Dominance  Dominant Hand: Right    Extremity/Trunk Assessment        Lower Extremity Assessment Lower Extremity Assessment: Generalized weakness    Cervical / Trunk Assessment Cervical / Trunk Assessment: Ebony  Communication   Communication: No difficulties  Cognition Arousal/Alertness: Awake/alert Behavior During Therapy: WFL for tasks assessed/performed Overall Cognitive Status: Within Functional Limits for tasks assessed                                         General Comments      Exercises     Assessment/Plan    PT Assessment Patient needs continued PT services  PT Problem List Decreased balance;Decreased knowledge of use of DME;Decreased mobility;Decreased strength;Decreased activity tolerance       PT Treatment Interventions DME instruction;Gait training;Balance training;Therapeutic exercise;Functional mobility training;Therapeutic activities;Stair training;Patient/family education    PT Goals (Current goals can be found in the Care Plan section)  Acute Rehab PT Goals PT Goal Formulation: With patient Time For Goal Achievement: 02/26/20 Potential to Achieve Goals: Good    Frequency Min 3X/week   Barriers to discharge        Co-evaluation               AM-PAC PT "6 Clicks" Mobility  Outcome Measure Help needed turning from your back to your side while in a flat bed without using bedrails?: None Help needed moving from lying on your back to sitting on the side of a flat bed without using bedrails?: None Help needed moving to and from a bed to a chair (including a wheelchair)?: A Little Help needed standing up from a chair using your arms (e.g., wheelchair or bedside chair)?: A Little Help needed to walk in hospital room?: A Little Help needed climbing 3-5 steps with a railing? : A Little 6 Click Score: 20    End of Session   Activity Tolerance: Patient tolerated treatment well Patient left: in chair;with call bell/phone within reach;with chair alarm set   PT Visit Diagnosis: Difficulty in walking, not elsewhere classified (R26.2);Unsteadiness on feet (R26.81)    Time: 8341-9622 PT Time Calculation (min) (ACUTE ONLY): 13 min   Charges:   PT Evaluation $PT Eval Low Complexity: 1 Low     Kati PT, DPT Acute Rehabilitation Services Pager: [phone] Office: [phone]  Maida Sale E 02/19/2020, 11:53 AM

## 2020-02-19 NOTE — Progress Notes (Signed)
PROGRESS NOTE    Ebony Scott  GEZ:[id number] DOB: Jul 28, 1950 DOA: 02/16/2020 PCP: [name], MD    No chief complaint on file.   Brief Narrative:  This is a 69 y.o. 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  She has failed outpatient conservative measures.  Patient underwent a right reverse total shoulder arthroplasty on 10/28 with Dr. Dion Saucier.  Yesterday, the patient noted shortness of breath when getting up to use the restroom.  States that she chronically has shortness of breath on exertion but this has been worsening over the past 6 months.  States she recently had a stress test which was unremarkable and follows with cardiology.  Has an upcoming cardiology appointment on 02/29/2020.  Also follows with pulmonology.  States that over the past 6 months or so she has had decreased activity level due to her shoulder pain and is pretty much sedentary throughout the day.  She become short of breath with 1 flight of stairs.  Admits to chronic lower extremity edema and some central chest tightness on exertion but no pain which resolves with rest. Denies any wheezing but has had a minimally productive cough over the past day or so.  Denies any fever or chills or any other issues.   Assessment & Plan:   Principal Problem:   Osteoarthritis of right shoulder Active Problems:   Rheumatoid arthritis (HCC)   S/P reverse total shoulder arthroplasty, left   DOE (dyspnea on exertion)   S/P reverse total shoulder arthroplasty, right   Hypokalemia   1 dyspnea on exertion Multifactorial secondary to debility, atelectasis, chronic asthma,  tracheobronchomalacia, chronic diastolic heart failure Patient currently with sats of 96% on room air.  Chest x-ray with atelectasis.  Patient with no worsening shortness of breath.  Continue Dulera, Spiriva, Singulair.  Continue oral Lasix.  Continue incentive spirometry, flutter valve.  Outpatient follow-up with cardiology and pulmonary.  2.  Hypokalemia Questionable etiology.  Not on oral potassium supplementation at home.  Potassium repleted currently at 3.6.  Magnesium noted at 1.8.  Magnesium sulfate 2 g IV x1.  Outpatient follow-up with cardiology.  Will need to discuss with cardiology as to whether she needs daily potassium supplementation.  3.  Leukocytosis Likely reactive.  Patient afebrile.  No signs or symptoms of infection.  Improving.  Follow.  4.  Right shoulder osteoarthritis status post reverse shoulder arthroplasty 02/16/2020 per Dr. Dion Saucier Per primary team.  5.  Rheumatoid arthritis Continue home regimen of prednisone.  Outpatient follow-up with rheumatology.  6.  History of recurrent DVTs Xarelto.   DVT prophylaxis: Per primary team Code Status: Full Family Communication: Updated patient.  No family at bedside. Disposition:   Status is: Inpatient    Dispo: The patient is from: Home              Anticipated d/c is to: Home              Anticipated d/c date is: Today              Patient currently medically stable for d/c from medical standpoint.       Consultants:   [facility] hospitalist: Dr. Dairl Ponder 02/18/2020  Procedures:   Chest x-ray 02/18/2020  Right reverse total shoulder arthroplasty per Dr. Mardelle Matte 02/16/2020  Antimicrobials:   IV Ancef 02/16/2020>>>> 02/17/2020   Subjective: Patient sitting up in bed.  Denies any significant shortness of breath.  Denies any chest pain.  Awaiting to be discharged.  States she has follow-up with cardiology in the next 1 to 2 weeks and will make follow-up with pulmonary.  Objective: Vitals:   02/18/20 1348  02/18/20 2217 02/18/20 2347 02/19/20 0629  BP: (!) 143/82 131/75  139/65  Pulse: (!) 106 93 97 87  Resp: 18 18 20 18   Temp: 98.1 F (36.7 C) 98.8 F (37.1 C)  98.2 F (36.8 C)  TempSrc: Oral Oral    SpO2: 98% 96% 97% 96%  Weight:      Height:        Intake/Output Summary (Last 24 hours) at 02/19/2020 2004 Last data filed at 02/19/2020 0600 Gross per 24 hour  Intake 450 ml  Output --  Net 450 ml   Filed Weights   02/16/20 1900  Weight: 67.3 kg    Examination:  General exam: Appears calm and comfortable  Respiratory system: Clear to auscultation. Respiratory effort normal. Cardiovascular system: S1 & S2 heard, RRR. No JVD, murmurs, rubs, gallops or clicks.  2+ bilateral lower extremity edema. Gastrointestinal system: Abdomen is nondistended, soft and nontender. No organomegaly or masses felt. Normal bowel sounds heard. Central nervous system: Alert and oriented. No focal neurological deficits. Extremities: Right upper extremity in sling. Skin: No rashes, lesions or ulcers Psychiatry: Judgement and insight appear normal. Mood & affect appropriate.     Data Reviewed: I have personally reviewed following labs and imaging studies  CBC: Recent Labs  Lab 02/17/20 0252 02/18/20 0320 02/19/20 0817  WBC 13.7* 11.2* 11.5*  HGB 10.6* 10.0* 9.8*  HCT 34.1* 31.7* 30.5*  MCV 109.6* 108.9* 106.6*  PLT 159 142* 143*    Basic Metabolic Panel: Recent Labs  Lab 02/17/20 0252 02/18/20 0700 02/19/20 0817 02/19/20 0844  NA 140 138 139  --   K 3.9 2.8* 3.6  --   CL 105 100 103  --   CO2 26 25 25   --   GLUCOSE 168* 182* 173*  --   BUN 24* 29* 31*  --   CREATININE 1.39* 1.71* 1.26*  --   CALCIUM 8.3* 7.8* 8.1*  --   MG  --  1.9  --  1.8    GFR: Estimated Creatinine Clearance: 35.9 mL/min (A) (by C-G formula based on SCr of 1.26 mg/dL (H)).  Liver Function Tests: Recent Labs  Lab 02/19/20 0817  AST 56*  ALT 86*  ALKPHOS 39  BILITOT 1.0  PROT 5.6*  ALBUMIN 3.1*     CBG: No results for input(s): GLUCAP in the last 168 hours.   Recent Results (from the past 240 hour(s))  Surgical pcr screen     Status: None   Collection Time: 02/10/20 10:53 AM   Specimen: Nasal Mucosa; Nasal Swab  Result Value Ref Range Status   MRSA, PCR NEGATIVE NEGATIVE Final   Staphylococcus aureus NEGATIVE NEGATIVE Final    Comment: (NOTE) The Xpert SA Assay (FDA approved for NASAL specimens in patients 60 years of age and older), is one component of a comprehensive surveillance program. It is not intended to diagnose infection nor to guide or monitor treatment. Performed at [facility], [place] [address] [place], [place] [address]   SARS CORONAVIRUS 2 (TAT 6-24 HRS) Nasopharyngeal Nasopharyngeal Swab     Status:  None   Collection Time: 02/15/20  1:23 PM   Specimen: Nasopharyngeal Swab  Result Value Ref Range Status   SARS Coronavirus 2 NEGATIVE NEGATIVE Final    Comment: (NOTE) SARS-CoV-2 target nucleic acids are NOT DETECTED.  The SARS-CoV-2 RNA is generally detectable in upper and lower respiratory specimens during the acute phase of infection. Negative results do not preclude SARS-CoV-2 infection, do not rule out co-infections with other pathogens, and should not be used as the sole basis for treatment or other patient management decisions. Negative results must be combined with clinical observations, patient history, and epidemiological information. The expected result is Negative.  Fact Sheet for Patients: SugarRoll.be  Fact Sheet for Healthcare Providers: https://www.woods-mathews.com/  This test is not yet approved or cleared by the [place] FDA and  has been authorized for detection and/or diagnosis of SARS-CoV-2 by FDA under an Emergency Use Authorization (EUA). This EUA will remain  in effect (meaning this test can be used) for the duration of the COVID-19 declaration under Se ction  564(b)(1) of the Act, 21 U.S.C. section 360bbb-3(b)(1), unless the authorization is terminated or revoked sooner.  Performed at [facility], [place] [address], [place], [place] [address]   Aerobic/Anaerobic Culture (surgical/deep wound)     Status: None (Preliminary result)   Collection Time: 02/16/20 11:10 AM   Specimen: PATH Soft tissue  Result Value Ref Range Status   Specimen Description   Final    TISSUE RIGHT HUMERUS Performed at [place] [address], [place], [place] [address]    Special Requests   Final    NONE Performed at [facility], [place] [address], [place], [place] [address]    Gram Stain   Final    RARE WBC PRESENT,BOTH PMN AND MONONUCLEAR NO ORGANISMS SEEN    Culture   Final    NO GROWTH 3 DAYS NO ANAEROBES ISOLATED; CULTURE IN PROGRESS FOR 5 DAYS Performed at [facility], [place] [address] [place], [place] [address]    Report Status PENDING  Incomplete         Radiology Studies: DG Chest 2 View  Result Date: 02/18/2020 CLINICAL DATA:  Shortness of breath EXAM: CHEST - 2 VIEW COMPARISON:  CT chest dated 08/28/2019 FINDINGS: Mild right basilar atelectasis. Left lung is clear. No pleural effusion or pneumothorax. Mild eventration of the right hemidiaphragm. Heart is normal in size. Right shoulder arthroplasty. Healing left posterolateral 6th and 8th rib fractures. Postprocedural changes in the left breast. Cholecystectomy clips. IMPRESSION: Mild right basilar atelectasis. Healing left rib fractures. Electronically Signed   By: Julian Hy M.D.   On: 02/18/2020 13:28        Scheduled Meds: . denosumab  60 mg Subcutaneous Q6 months   Continuous Infusions:    LOS: 3 days    Time spent: 35 minutes    Irine Seal, MD [facility]   To contact the attending provider between 7A-7P or the covering provider during after hours 7P-7A, please log into the web site [email or web address] and  access using universal [place] password for that web site. If you do not have the password, please call the hospital operator.  02/19/2020, 8:04 PM

## 2020-02-19 NOTE — TOC Transition Note (Signed)
Transition of Care [facility]) - CM/SW Discharge Note   Patient Details  Name: Ebony Scott MRN: [id number] Date of Birth: [birth year]  [facility]) CM/SW Contact:  Amada Jupiter, LCSW Phone Number: 02/19/2020, 1:46 PM   Clinical Narrative:    Alerted to orders for HHPT - referral placed with [facility].  No further TOC needs.   Final next level of care: Home w Home Health Services Barriers to Discharge: No Barriers Identified   Patient Goals and CMS Choice Patient states their goals for this hospitalization and ongoing recovery are:: for my arm to heal      Discharge Placement                       Discharge Plan and Services                DME Arranged: N/A DME Agency: NA       HH Arranged: PT HH Agency: [facility] Date [place] Agency Contacted: 02/19/20 Time HH Agency Contacted: 1346 Representative spoke with at [facility] Agency: Kandee Keen  Social Determinants of Health (SDOH) Interventions     Readmission Risk Interventions No flowsheet data found.

## 2020-02-20 IMAGING — CT CT CHEST HIGH RESOLUTION W/O CM
2 of 5 series · 15 of 36 positions shown, 18 images · non-contrast
Comparison: Chest CT 04/15/2016.

CLINICAL DATA: 67-year-old female with history of shortness of
breath on exertion. Abnormal pulmonary function tests.

EXAM:
CT CHEST WITHOUT CONTRAST
TECHNIQUE: Multidetector CT imaging of the chest was performed following the
standard protocol without intravenous contrast. High resolution
imaging of the lungs, as well as inspiratory and expiratory imaging,
was performed.

[Series 2: high resolution · axial · 0.61mm/px · z∈[-263,-3]mm · 12 of 144 slices shown, 15 images]
[im 7/144  mediastinal]
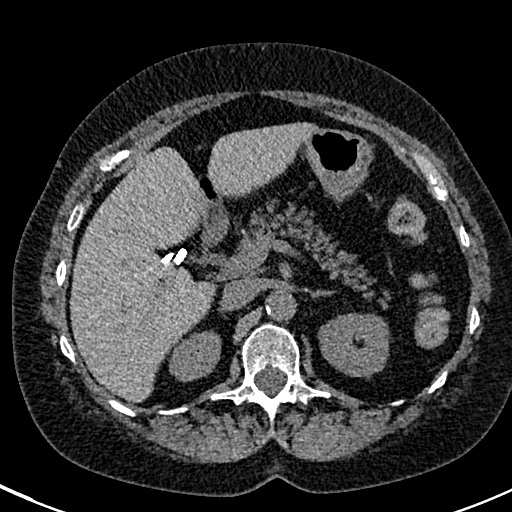
[im 7/144  lung]
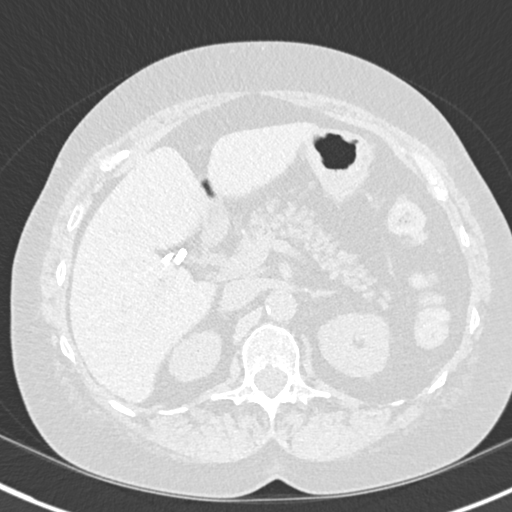
[im 20/144  lung]
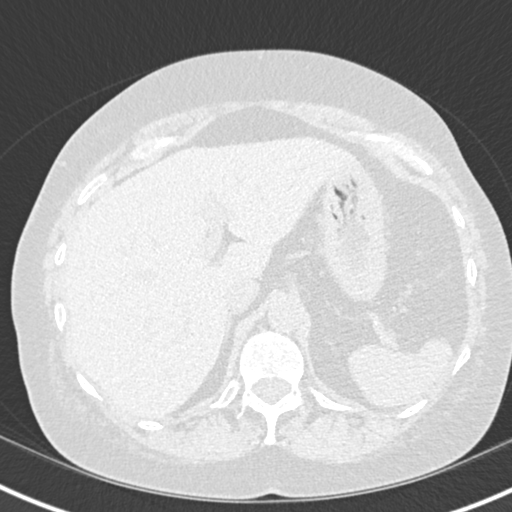
[im 33/144  lung]
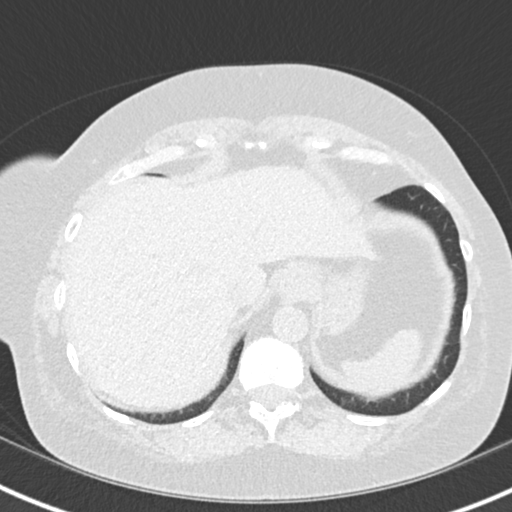
[im 46/144  lung]
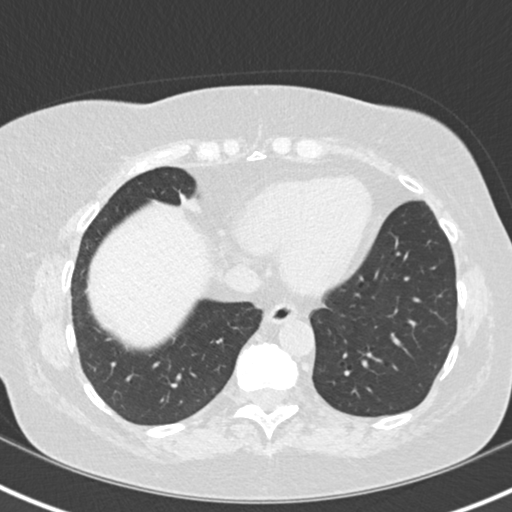
[im 53/144  mediastinal]
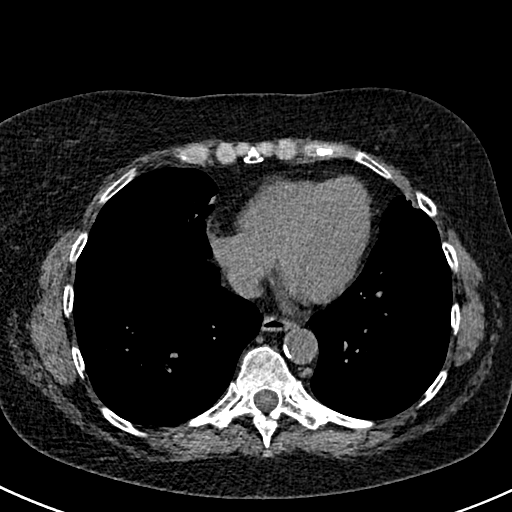
[im 53/144  lung]
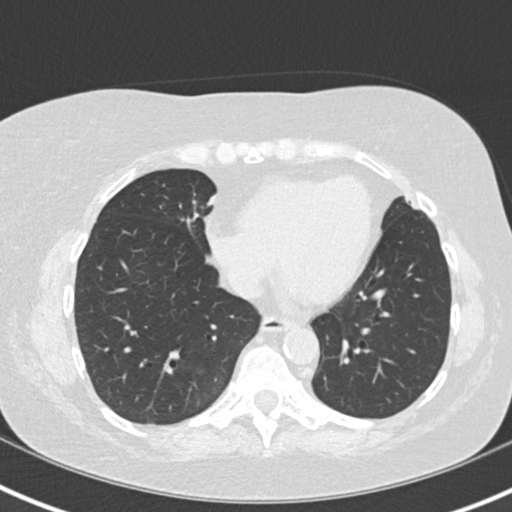
[im 66/144  lung]
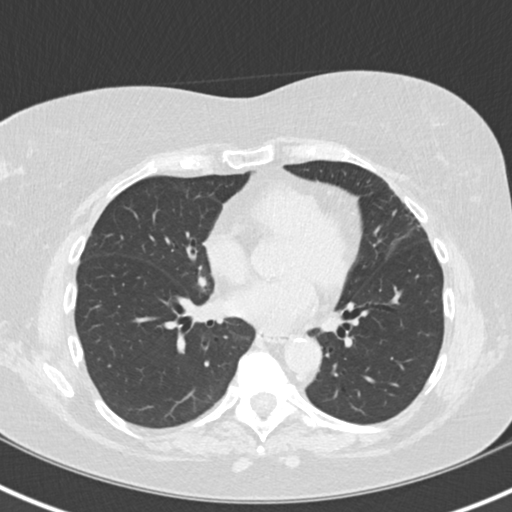
[im 79/144  lung]
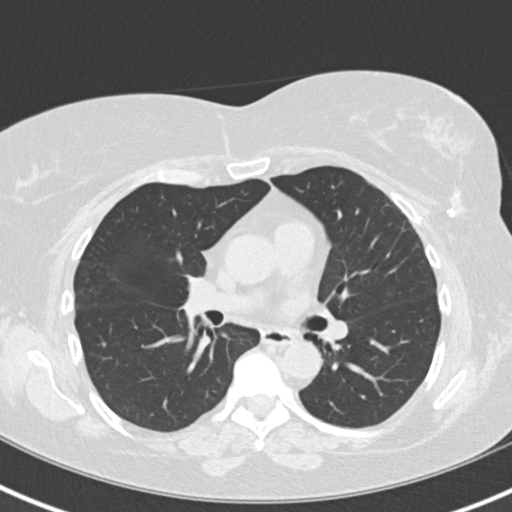
[im 92/144  lung]
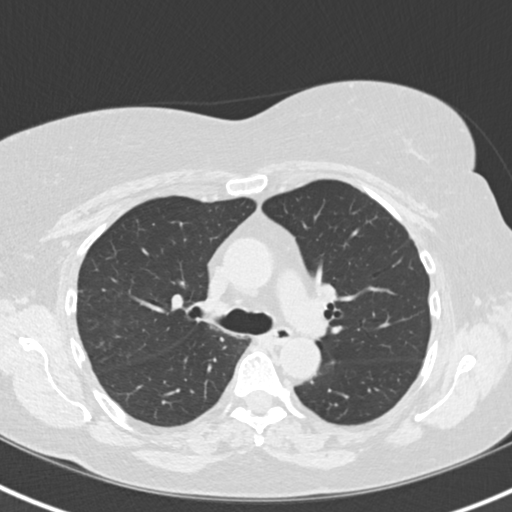
[im 98/144  mediastinal]
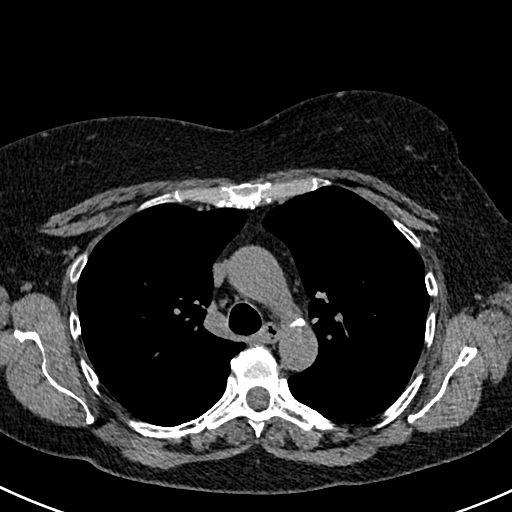
[im 98/144  lung]
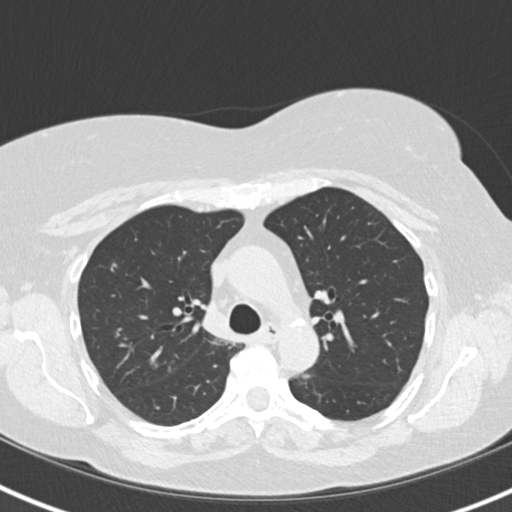
[im 111/144  lung]
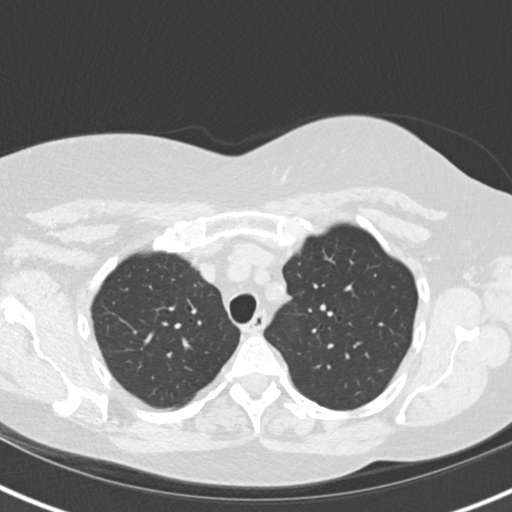
[im 124/144  lung]
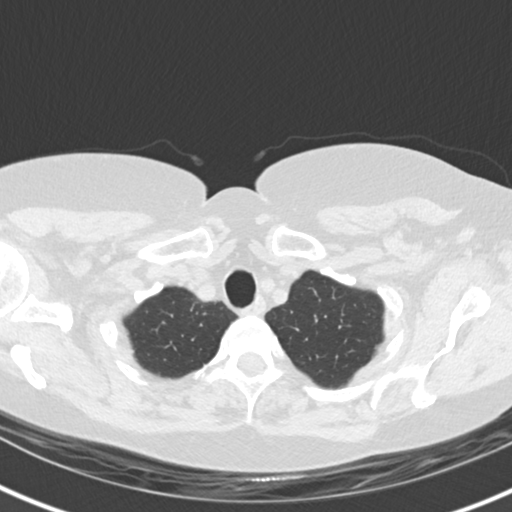
[im 137/144  lung]
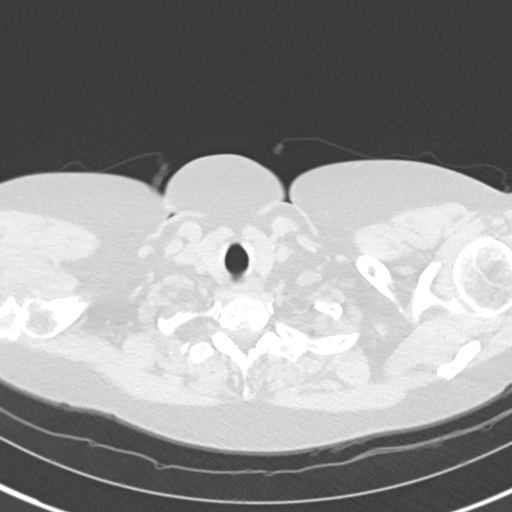

[Series 8: coronal · coronal · 0.59mm/px · 3 of 104 slices shown]
[im 21/104  lung]
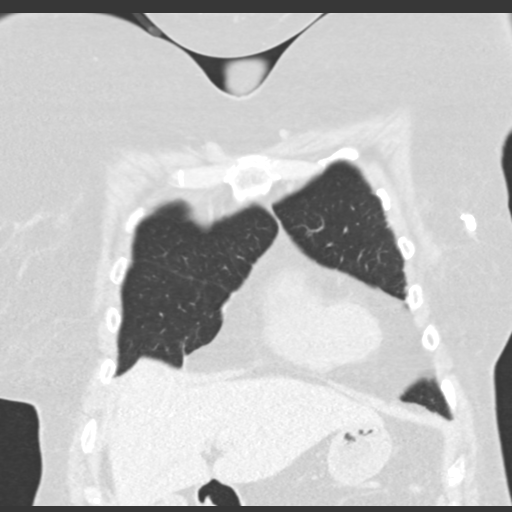
[im 42/104  lung]
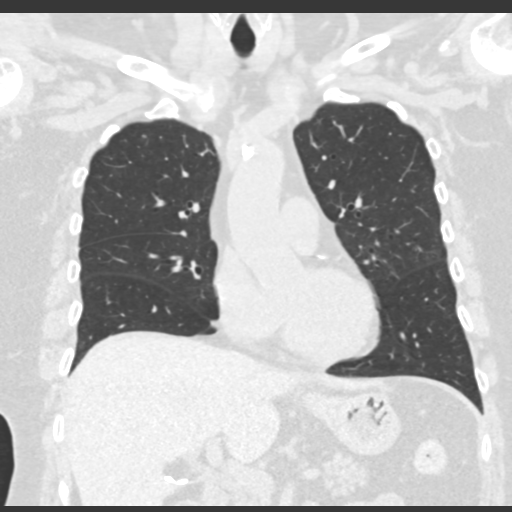
[im 62/104  lung]
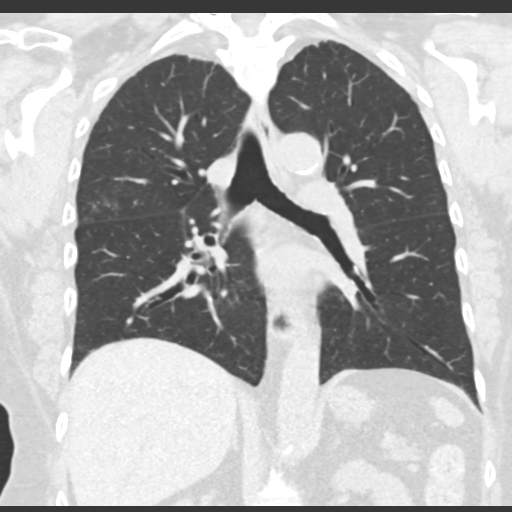

[15 of 36 positions shown; findings below may reference images not displayed]

FINDINGS: Cardiovascular: Heart size is normal. There is no significant
pericardial fluid, thickening or pericardial calcification. There is
aortic atherosclerosis, as well as atherosclerosis of the great
vessels of the mediastinum and the coronary arteries, including
calcified atherosclerotic plaque in the left main, left anterior
descending left circumflex coronary arteries.

Mediastinum/Nodes: No pathologically enlarged mediastinal or hilar
lymph nodes. Please note that accurate exclusion of hilar adenopathy
is limited on noncontrast CT scans. Esophagus is unremarkable in
appearance. No axillary lymphadenopathy.

Lungs/Pleura: 6 mm left lower lobe nodule (axial image 107 of series
3) and 4 mm left lower lobe nodule (axial image 116 of series 3) are
both unchanged dating back to 06/05/2012, considered benign. No
larger more suspicious appearing pulmonary nodules or masses are
noted. A few patchy areas of peribronchovascular ground-glass
attenuation, septal thickening and micro nodularity are again noted,
most evident in the right upper lobe, similar to several prior
examinations, likely to reflect areas of post infectious scarring
and areas of mild mucoid impaction. High-resolution images otherwise
demonstrate no significant regions of ground-glass attenuation,
subpleural reticulation, traction bronchiectasis or frank
honeycombing. Inspiratory and expiratory imaging demonstrates some
mild air trapping, indicative of small airways disease. No acute
consolidative airspace disease. No pleural effusions. Areas of post
infectious scarring in the medial segment of the right middle lobe
and inferior segment of the left upper lobe are unchanged.

Upper Abdomen: Aortic atherosclerosis.  Status post cholecystectomy.

Musculoskeletal: Postoperative changes in the lateral aspect of the
left breast presumably from prior lumpectomy. There are no
aggressive appearing lytic or blastic lesions noted in the
visualized portions of the skeleton.
IMPRESSION: 1. No findings to suggest interstitial lung disease.
2. Mild air trapping indicative of mild small airways disease.
3. Aortic atherosclerosis, in addition to left main and 2 vessel
coronary artery disease. Please note that although the presence of
coronary artery calcium documents the presence of coronary artery
disease, the severity of this disease and any potential stenosis
cannot be assessed on this non-gated CT examination. Assessment for
potential risk factor modification, dietary therapy or pharmacologic
therapy may be warranted, if clinically indicated.
4. Additional incidental findings, as above.

Aortic Atherosclerosis (U56Z6-H59.9).

## 2020-02-21 LAB — AEROBIC/ANAEROBIC CULTURE W GRAM STAIN (SURGICAL/DEEP WOUND): Culture: NO GROWTH

## 2020-02-22 DIAGNOSIS — E7800 Pure hypercholesterolemia, unspecified: Secondary | ICD-10-CM | POA: Diagnosis not present

## 2020-02-22 DIAGNOSIS — J9811 Atelectasis: Secondary | ICD-10-CM | POA: Diagnosis not present

## 2020-02-22 DIAGNOSIS — E0500 Thyrotoxicosis with diffuse goiter without thyrotoxic crisis or storm: Secondary | ICD-10-CM | POA: Diagnosis not present

## 2020-02-22 DIAGNOSIS — E876 Hypokalemia: Secondary | ICD-10-CM | POA: Diagnosis not present

## 2020-02-22 DIAGNOSIS — Z96611 Presence of right artificial shoulder joint: Secondary | ICD-10-CM | POA: Diagnosis not present

## 2020-02-22 DIAGNOSIS — I739 Peripheral vascular disease, unspecified: Secondary | ICD-10-CM | POA: Diagnosis not present

## 2020-02-22 DIAGNOSIS — Z471 Aftercare following joint replacement surgery: Secondary | ICD-10-CM | POA: Diagnosis not present

## 2020-02-22 DIAGNOSIS — M0600 Rheumatoid arthritis without rheumatoid factor, unspecified site: Secondary | ICD-10-CM | POA: Diagnosis not present

## 2020-02-22 DIAGNOSIS — M797 Fibromyalgia: Secondary | ICD-10-CM | POA: Diagnosis not present

## 2020-02-23 ENCOUNTER — Emergency Department (HOSPITAL_COMMUNITY): Payer: Medicare PPO

## 2020-02-23 ENCOUNTER — Encounter (HOSPITAL_COMMUNITY): Payer: Self-pay | Admitting: Emergency Medicine

## 2020-02-23 ENCOUNTER — Observation Stay (HOSPITAL_COMMUNITY): Payer: Medicare PPO

## 2020-02-23 ENCOUNTER — Inpatient Hospital Stay (HOSPITAL_COMMUNITY)
Admission: EM | Admit: 2020-02-23 | Discharge: 2020-02-29 | DRG: 092 | Disposition: A | Discharge status: Home Health | Payer: Medicare PPO | Attending: Family Medicine | Admitting: Family Medicine

## 2020-02-23 DIAGNOSIS — R000 Tachycardia, unspecified: Secondary | ICD-10-CM | POA: Diagnosis present

## 2020-02-23 DIAGNOSIS — N1831 Chronic kidney disease, stage 3a: Secondary | ICD-10-CM | POA: Diagnosis present

## 2020-02-23 DIAGNOSIS — B370 Candidal stomatitis: Secondary | ICD-10-CM | POA: Diagnosis present

## 2020-02-23 DIAGNOSIS — E876 Hypokalemia: Secondary | ICD-10-CM | POA: Diagnosis present

## 2020-02-23 DIAGNOSIS — E861 Hypovolemia: Secondary | ICD-10-CM | POA: Diagnosis present

## 2020-02-23 DIAGNOSIS — G9340 Encephalopathy, unspecified: Secondary | ICD-10-CM

## 2020-02-23 DIAGNOSIS — E872 Acidosis: Secondary | ICD-10-CM | POA: Diagnosis present

## 2020-02-23 DIAGNOSIS — M810 Age-related osteoporosis without current pathological fracture: Secondary | ICD-10-CM | POA: Diagnosis present

## 2020-02-23 DIAGNOSIS — D72829 Elevated white blood cell count, unspecified: Secondary | ICD-10-CM | POA: Diagnosis present

## 2020-02-23 DIAGNOSIS — R32 Unspecified urinary incontinence: Secondary | ICD-10-CM | POA: Diagnosis present

## 2020-02-23 DIAGNOSIS — M069 Rheumatoid arthritis, unspecified: Secondary | ICD-10-CM | POA: Diagnosis present

## 2020-02-23 DIAGNOSIS — M797 Fibromyalgia: Secondary | ICD-10-CM | POA: Diagnosis present

## 2020-02-23 DIAGNOSIS — Z886 Allergy status to analgesic agent status: Secondary | ICD-10-CM

## 2020-02-23 DIAGNOSIS — Z86718 Personal history of other venous thrombosis and embolism: Secondary | ICD-10-CM | POA: Diagnosis not present

## 2020-02-23 DIAGNOSIS — R7401 Elevation of levels of liver transaminase levels: Secondary | ICD-10-CM | POA: Diagnosis present

## 2020-02-23 DIAGNOSIS — J398 Other specified diseases of upper respiratory tract: Secondary | ICD-10-CM | POA: Diagnosis present

## 2020-02-23 DIAGNOSIS — E782 Mixed hyperlipidemia: Secondary | ICD-10-CM | POA: Diagnosis not present

## 2020-02-23 DIAGNOSIS — E860 Dehydration: Secondary | ICD-10-CM | POA: Diagnosis present

## 2020-02-23 DIAGNOSIS — J9811 Atelectasis: Secondary | ICD-10-CM | POA: Diagnosis present

## 2020-02-23 DIAGNOSIS — Z471 Aftercare following joint replacement surgery: Secondary | ICD-10-CM | POA: Diagnosis not present

## 2020-02-23 DIAGNOSIS — E2749 Other adrenocortical insufficiency: Secondary | ICD-10-CM | POA: Diagnosis present

## 2020-02-23 DIAGNOSIS — S199XXA Unspecified injury of neck, initial encounter: Secondary | ICD-10-CM | POA: Diagnosis not present

## 2020-02-23 DIAGNOSIS — T380X5A Adverse effect of glucocorticoids and synthetic analogues, initial encounter: Secondary | ICD-10-CM | POA: Diagnosis present

## 2020-02-23 DIAGNOSIS — K219 Gastro-esophageal reflux disease without esophagitis: Secondary | ICD-10-CM | POA: Diagnosis not present

## 2020-02-23 DIAGNOSIS — E870 Hyperosmolality and hypernatremia: Secondary | ICD-10-CM | POA: Diagnosis present

## 2020-02-23 DIAGNOSIS — G9341 Metabolic encephalopathy: Secondary | ICD-10-CM | POA: Diagnosis present

## 2020-02-23 DIAGNOSIS — Z7951 Long term (current) use of inhaled steroids: Secondary | ICD-10-CM

## 2020-02-23 DIAGNOSIS — R0600 Dyspnea, unspecified: Secondary | ICD-10-CM

## 2020-02-23 DIAGNOSIS — J90 Pleural effusion, not elsewhere classified: Secondary | ICD-10-CM | POA: Diagnosis not present

## 2020-02-23 DIAGNOSIS — I5032 Chronic diastolic (congestive) heart failure: Secondary | ICD-10-CM | POA: Diagnosis present

## 2020-02-23 DIAGNOSIS — G928 Other toxic encephalopathy: Principal | ICD-10-CM | POA: Diagnosis present

## 2020-02-23 DIAGNOSIS — R0602 Shortness of breath: Secondary | ICD-10-CM | POA: Diagnosis not present

## 2020-02-23 DIAGNOSIS — Z853 Personal history of malignant neoplasm of breast: Secondary | ICD-10-CM

## 2020-02-23 DIAGNOSIS — G4730 Sleep apnea, unspecified: Secondary | ICD-10-CM | POA: Diagnosis present

## 2020-02-23 DIAGNOSIS — Z9049 Acquired absence of other specified parts of digestive tract: Secondary | ICD-10-CM

## 2020-02-23 DIAGNOSIS — Z7901 Long term (current) use of anticoagulants: Secondary | ICD-10-CM

## 2020-02-23 DIAGNOSIS — Z96611 Presence of right artificial shoulder joint: Secondary | ICD-10-CM

## 2020-02-23 DIAGNOSIS — T428X5A Adverse effect of antiparkinsonism drugs and other central muscle-tone depressants, initial encounter: Secondary | ICD-10-CM | POA: Diagnosis present

## 2020-02-23 DIAGNOSIS — Z881 Allergy status to other antibiotic agents status: Secondary | ICD-10-CM

## 2020-02-23 DIAGNOSIS — R531 Weakness: Secondary | ICD-10-CM | POA: Diagnosis not present

## 2020-02-23 DIAGNOSIS — Z79899 Other long term (current) drug therapy: Secondary | ICD-10-CM

## 2020-02-23 DIAGNOSIS — T40425A Adverse effect of tramadol, initial encounter: Secondary | ICD-10-CM | POA: Diagnosis present

## 2020-02-23 DIAGNOSIS — R4182 Altered mental status, unspecified: Secondary | ICD-10-CM | POA: Diagnosis not present

## 2020-02-23 DIAGNOSIS — G4733 Obstructive sleep apnea (adult) (pediatric): Secondary | ICD-10-CM | POA: Diagnosis not present

## 2020-02-23 DIAGNOSIS — Z8261 Family history of arthritis: Secondary | ICD-10-CM

## 2020-02-23 DIAGNOSIS — Z823 Family history of stroke: Secondary | ICD-10-CM

## 2020-02-23 DIAGNOSIS — G319 Degenerative disease of nervous system, unspecified: Secondary | ICD-10-CM | POA: Diagnosis not present

## 2020-02-23 DIAGNOSIS — R410 Disorientation, unspecified: Secondary | ICD-10-CM | POA: Diagnosis not present

## 2020-02-23 DIAGNOSIS — Z885 Allergy status to narcotic agent status: Secondary | ICD-10-CM

## 2020-02-23 DIAGNOSIS — M25511 Pain in right shoulder: Secondary | ICD-10-CM | POA: Diagnosis not present

## 2020-02-23 DIAGNOSIS — J4520 Mild intermittent asthma, uncomplicated: Secondary | ICD-10-CM | POA: Diagnosis present

## 2020-02-23 DIAGNOSIS — R6510 Systemic inflammatory response syndrome (SIRS) of non-infectious origin without acute organ dysfunction: Secondary | ICD-10-CM

## 2020-02-23 DIAGNOSIS — Z96612 Presence of left artificial shoulder joint: Secondary | ICD-10-CM | POA: Diagnosis present

## 2020-02-23 DIAGNOSIS — T1490XA Injury, unspecified, initial encounter: Secondary | ICD-10-CM

## 2020-02-23 DIAGNOSIS — J471 Bronchiectasis with (acute) exacerbation: Secondary | ICD-10-CM | POA: Diagnosis not present

## 2020-02-23 DIAGNOSIS — S0990XA Unspecified injury of head, initial encounter: Secondary | ICD-10-CM | POA: Diagnosis not present

## 2020-02-23 DIAGNOSIS — Z880 Allergy status to penicillin: Secondary | ICD-10-CM

## 2020-02-23 DIAGNOSIS — R0902 Hypoxemia: Secondary | ICD-10-CM | POA: Diagnosis not present

## 2020-02-23 DIAGNOSIS — E1165 Type 2 diabetes mellitus with hyperglycemia: Secondary | ICD-10-CM | POA: Diagnosis not present

## 2020-02-23 DIAGNOSIS — Z800 Family history of malignant neoplasm of digestive organs: Secondary | ICD-10-CM

## 2020-02-23 DIAGNOSIS — R0682 Tachypnea, not elsewhere classified: Secondary | ICD-10-CM

## 2020-02-23 DIAGNOSIS — M542 Cervicalgia: Secondary | ICD-10-CM | POA: Diagnosis not present

## 2020-02-23 DIAGNOSIS — E785 Hyperlipidemia, unspecified: Secondary | ICD-10-CM | POA: Diagnosis present

## 2020-02-23 DIAGNOSIS — Z043 Encounter for examination and observation following other accident: Secondary | ICD-10-CM | POA: Diagnosis not present

## 2020-02-23 DIAGNOSIS — Z86711 Personal history of pulmonary embolism: Secondary | ICD-10-CM

## 2020-02-23 DIAGNOSIS — Z7952 Long term (current) use of systemic steroids: Secondary | ICD-10-CM

## 2020-02-23 DIAGNOSIS — E739 Lactose intolerance, unspecified: Secondary | ICD-10-CM | POA: Diagnosis present

## 2020-02-23 DIAGNOSIS — Z8249 Family history of ischemic heart disease and other diseases of the circulatory system: Secondary | ICD-10-CM

## 2020-02-23 DIAGNOSIS — D539 Nutritional anemia, unspecified: Secondary | ICD-10-CM | POA: Diagnosis present

## 2020-02-23 DIAGNOSIS — Z96659 Presence of unspecified artificial knee joint: Secondary | ICD-10-CM | POA: Diagnosis present

## 2020-02-23 DIAGNOSIS — Z801 Family history of malignant neoplasm of trachea, bronchus and lung: Secondary | ICD-10-CM

## 2020-02-23 DIAGNOSIS — Z833 Family history of diabetes mellitus: Secondary | ICD-10-CM

## 2020-02-23 DIAGNOSIS — Z90710 Acquired absence of both cervix and uterus: Secondary | ICD-10-CM

## 2020-02-23 DIAGNOSIS — Z888 Allergy status to other drugs, medicaments and biological substances status: Secondary | ICD-10-CM

## 2020-02-23 DIAGNOSIS — I480 Paroxysmal atrial fibrillation: Secondary | ICD-10-CM | POA: Diagnosis present

## 2020-02-23 DIAGNOSIS — Z20822 Contact with and (suspected) exposure to covid-19: Secondary | ICD-10-CM | POA: Diagnosis present

## 2020-02-23 DIAGNOSIS — Z981 Arthrodesis status: Secondary | ICD-10-CM

## 2020-02-23 DIAGNOSIS — I6782 Cerebral ischemia: Secondary | ICD-10-CM | POA: Diagnosis not present

## 2020-02-23 LAB — PROTIME-INR
INR: 2.1 — ABNORMAL HIGH (ref 0.8–1.2)
Prothrombin Time: 22.4 seconds — ABNORMAL HIGH (ref 11.4–15.2)

## 2020-02-23 LAB — I-STAT CHEM 8, ED
BUN: 44 mg/dL — ABNORMAL HIGH (ref 8–23)
Calcium, Ion: 0.77 mmol/L — CL (ref 1.15–1.40)
Chloride: 101 mmol/L (ref 98–111)
Creatinine, Ser: 1.4 mg/dL — ABNORMAL HIGH (ref 0.44–1.00)
Glucose, Bld: 174 mg/dL — ABNORMAL HIGH (ref 70–99)
HCT: 33 % — ABNORMAL LOW (ref 36.0–46.0)
Hemoglobin: 11.2 g/dL — ABNORMAL LOW (ref 12.0–15.0)
Potassium: 3.4 mmol/L — ABNORMAL LOW (ref 3.5–5.1)
Sodium: 136 mmol/L (ref 135–145)
TCO2: 26 mmol/L (ref 22–32)

## 2020-02-23 LAB — URINALYSIS, ROUTINE W REFLEX MICROSCOPIC
Bilirubin Urine: NEGATIVE
Glucose, UA: NEGATIVE mg/dL
Hgb urine dipstick: NEGATIVE
Ketones, ur: NEGATIVE mg/dL
Leukocytes,Ua: NEGATIVE
Nitrite: NEGATIVE
Protein, ur: NEGATIVE mg/dL
Specific Gravity, Urine: 1.014 (ref 1.005–1.030)
pH: 6 (ref 5.0–8.0)

## 2020-02-23 LAB — CBC
HCT: 32.6 % — ABNORMAL LOW (ref 36.0–46.0)
Hemoglobin: 10.6 g/dL — ABNORMAL LOW (ref 12.0–15.0)
MCH: 35 pg — ABNORMAL HIGH (ref 26.0–34.0)
MCHC: 32.5 g/dL (ref 30.0–36.0)
MCV: 107.6 fL — ABNORMAL HIGH (ref 80.0–100.0)
Platelets: 198 10*3/uL (ref 150–400)
RBC: 3.03 MIL/uL — ABNORMAL LOW (ref 3.87–5.11)
RDW: 20.7 % — ABNORMAL HIGH (ref 11.5–15.5)
WBC: 12.5 10*3/uL — ABNORMAL HIGH (ref 4.0–10.5)
nRBC: 0.7 % — ABNORMAL HIGH (ref 0.0–0.2)

## 2020-02-23 LAB — RAPID URINE DRUG SCREEN, HOSP PERFORMED
Amphetamines: NOT DETECTED
Barbiturates: NOT DETECTED
Benzodiazepines: NOT DETECTED
Cocaine: NOT DETECTED
Opiates: NOT DETECTED
Tetrahydrocannabinol: NOT DETECTED

## 2020-02-23 LAB — COMPREHENSIVE METABOLIC PANEL
ALT: 115 U/L — ABNORMAL HIGH (ref 0–44)
AST: 67 U/L — ABNORMAL HIGH (ref 15–41)
Albumin: 2.9 g/dL — ABNORMAL LOW (ref 3.5–5.0)
Alkaline Phosphatase: 71 U/L (ref 38–126)
Anion gap: 18 — ABNORMAL HIGH (ref 5–15)
BUN: 48 mg/dL — ABNORMAL HIGH (ref 8–23)
CO2: 24 mmol/L (ref 22–32)
Calcium: 9.3 mg/dL (ref 8.9–10.3)
Chloride: 96 mmol/L — ABNORMAL LOW (ref 98–111)
Creatinine, Ser: 1.52 mg/dL — ABNORMAL HIGH (ref 0.44–1.00)
GFR, Estimated: 36 mL/min — ABNORMAL LOW (ref >60)
Glucose, Bld: 187 mg/dL — ABNORMAL HIGH (ref 70–99)
Potassium: 4 mmol/L (ref 3.5–5.1)
Sodium: 138 mmol/L (ref 135–145)
Total Bilirubin: 1.5 mg/dL — ABNORMAL HIGH (ref 0.3–1.2)
Total Protein: 6.1 g/dL — ABNORMAL LOW (ref 6.5–8.1)

## 2020-02-23 LAB — I-STAT VENOUS BLOOD GAS, ED
Acid-Base Excess: 3 mmol/L — ABNORMAL HIGH (ref 0.0–2.0)
Bicarbonate: 26.2 mmol/L (ref 20.0–28.0)
Calcium, Ion: 0.76 mmol/L — CL (ref 1.15–1.40)
HCT: 26 % — ABNORMAL LOW (ref 36.0–46.0)
Hemoglobin: 8.8 g/dL — ABNORMAL LOW (ref 12.0–15.0)
O2 Saturation: 84 %
Potassium: 3.3 mmol/L — ABNORMAL LOW (ref 3.5–5.1)
Sodium: 139 mmol/L (ref 135–145)
TCO2: 27 mmol/L (ref 22–32)
pCO2, Ven: 32.9 mmHg — ABNORMAL LOW (ref 44.0–60.0)
pH, Ven: 7.509 — ABNORMAL HIGH (ref 7.250–7.430)
pO2, Ven: 43 mmHg (ref 32.0–45.0)

## 2020-02-23 LAB — FOLATE: Folate: 50.7 ng/mL (ref >5.9)

## 2020-02-23 LAB — RESP PANEL BY RT-PCR (FLU A&B, COVID) ARPGX2
Influenza A by PCR: NEGATIVE
Influenza B by PCR: NEGATIVE
SARS Coronavirus 2 by RT PCR: NEGATIVE

## 2020-02-23 LAB — VITAMIN B12: Vitamin B-12: 1001 pg/mL — ABNORMAL HIGH (ref 180–914)

## 2020-02-23 LAB — LACTIC ACID, PLASMA: Lactic Acid, Venous: 3.8 mmol/L (ref 0.5–1.9)

## 2020-02-23 LAB — SAMPLE TO BLOOD BANK

## 2020-02-23 LAB — ETHANOL: Alcohol, Ethyl (B): <10 mg/dL (ref <10)

## 2020-02-23 LAB — CK: Total CK: 468 U/L — ABNORMAL HIGH (ref 38–234)

## 2020-02-23 LAB — AMMONIA: Ammonia: 19 umol/L (ref 9–35)

## 2020-02-23 LAB — TSH: TSH: 1.569 u[IU]/mL (ref 0.350–4.500)

## 2020-02-23 MED ORDER — LACTASE 3000 UNITS PO TABS: 18000.0000 [IU] | ORAL_TABLET | Freq: Four times a day (QID) | ORAL | Status: DC | PRN

## 2020-02-23 MED ORDER — SODIUM CHLORIDE 0.9 % IV SOLN
2.0000 g | Freq: Once | INTRAVENOUS | Status: AC
  Administered 2020-02-23: 2 g via INTRAVENOUS
  Filled 2020-02-23: qty 2

## 2020-02-23 MED ORDER — GUAIFENESIN ER 600 MG PO TB12
600.0000 mg | ORAL_TABLET | Freq: Two times a day (BID) | ORAL | Status: DC
  Administered 2020-02-23 – 2020-02-29 (×12): 600 mg via ORAL
  Filled 2020-02-23 (×12): qty 1

## 2020-02-23 MED ORDER — METRONIDAZOLE IN NACL 5-0.79 MG/ML-% IV SOLN
500.0000 mg | Freq: Once | INTRAVENOUS | Status: AC
  Administered 2020-02-23: 500 mg via INTRAVENOUS
  Filled 2020-02-23: qty 100

## 2020-02-23 MED ORDER — FAMOTIDINE 20 MG PO TABS
20.0000 mg | ORAL_TABLET | Freq: Two times a day (BID) | ORAL | Status: DC
  Administered 2020-02-23 – 2020-02-29 (×12): 20 mg via ORAL
  Filled 2020-02-23 (×12): qty 1

## 2020-02-23 MED ORDER — LACTATED RINGERS IV SOLN: INTRAVENOUS | Status: DC

## 2020-02-23 MED ORDER — VITAMIN D 25 MCG (1000 UNIT) PO TABS
1000.0000 [IU] | ORAL_TABLET | Freq: Every day | ORAL | Status: DC
  Administered 2020-02-23 – 2020-02-29 (×7): 1000 [IU] via ORAL
  Filled 2020-02-23 (×7): qty 1

## 2020-02-23 MED ORDER — FLUCONAZOLE 100 MG PO TABS: 100.0000 mg | ORAL_TABLET | Freq: Every day | ORAL | Status: DC

## 2020-02-23 MED ORDER — SODIUM CHLORIDE 0.9 % IV BOLUS
1000.0000 mL | Freq: Once | INTRAVENOUS | Status: AC
Start: 2020-02-23 — End: 2020-02-23
  Administered 2020-02-23: 1000 mL via INTRAVENOUS

## 2020-02-23 MED ORDER — ALBUTEROL SULFATE (2.5 MG/3ML) 0.083% IN NEBU
2.5000 mg | INHALATION_SOLUTION | Freq: Four times a day (QID) | RESPIRATORY_TRACT | Status: DC | PRN
  Administered 2020-02-27 – 2020-02-28 (×2): 2.5 mg via RESPIRATORY_TRACT
  Filled 2020-02-23 (×2): qty 3

## 2020-02-23 MED ORDER — LEFLUNOMIDE 20 MG PO TABS
20.0000 mg | ORAL_TABLET | Freq: Every day | ORAL | Status: DC
  Administered 2020-02-24 – 2020-02-28 (×6): 20 mg via ORAL
  Filled 2020-02-23 (×8): qty 1

## 2020-02-23 MED ORDER — ENOXAPARIN SODIUM 30 MG/0.3ML ~~LOC~~ SOLN: 30.0000 mg | SUBCUTANEOUS | Status: DC

## 2020-02-23 MED ORDER — GERHARDT'S BUTT CREAM: 1.0000 "application " | TOPICAL_CREAM | Freq: Every day | CUTANEOUS | Status: DC | PRN

## 2020-02-23 MED ORDER — PANTOPRAZOLE SODIUM 40 MG PO TBEC
40.0000 mg | DELAYED_RELEASE_TABLET | Freq: Every day | ORAL | Status: DC
  Administered 2020-02-23 – 2020-02-29 (×7): 40 mg via ORAL
  Filled 2020-02-23 (×7): qty 1

## 2020-02-23 MED ORDER — ACETAMINOPHEN ER 650 MG PO TBCR: 1300.0000 mg | EXTENDED_RELEASE_TABLET | Freq: Three times a day (TID) | ORAL | Status: DC | PRN

## 2020-02-23 MED ORDER — SODIUM CHLORIDE 0.9 % IV SOLN: 2.0000 g | Freq: Two times a day (BID) | INTRAVENOUS | Status: DC

## 2020-02-23 MED ORDER — CALCIUM CARBONATE 1250 (500 CA) MG PO TABS
1250.0000 mg | ORAL_TABLET | Freq: Two times a day (BID) | ORAL | Status: DC
  Administered 2020-02-24 – 2020-02-29 (×11): 1250 mg via ORAL
  Filled 2020-02-23 (×12): qty 1

## 2020-02-23 MED ORDER — CYPROHEPTADINE HCL 4 MG PO TABS
8.0000 mg | ORAL_TABLET | Freq: Every evening | ORAL | Status: DC
Start: 2020-02-23 — End: 2020-02-26
  Administered 2020-02-23 – 2020-02-25 (×2): 8 mg via ORAL
  Filled 2020-02-23 (×2): qty 2

## 2020-02-23 MED ORDER — CEPHALEXIN 250 MG PO CAPS: 500.0000 mg | ORAL_CAPSULE | Freq: Three times a day (TID) | ORAL | Status: DC

## 2020-02-23 MED ORDER — FLUTICASONE PROPIONATE 50 MCG/ACT NA SUSP
2.0000 | Freq: Every day | NASAL | Status: DC
  Administered 2020-02-24 – 2020-02-28 (×4): 2 via NASAL
  Filled 2020-02-23: qty 16

## 2020-02-23 MED ORDER — SODIUM CHLORIDE 0.9 % IV SOLN: 2.0000 g | Freq: Once | INTRAVENOUS | Status: DC

## 2020-02-23 MED ORDER — SODIUM CHLORIDE 3 % IN NEBU
4.0000 mL | INHALATION_SOLUTION | Freq: Two times a day (BID) | RESPIRATORY_TRACT | Status: AC
  Administered 2020-02-25: 4 mL via RESPIRATORY_TRACT
  Filled 2020-02-23 (×6): qty 4

## 2020-02-23 MED ORDER — AMMONIUM LACTATE 12 % EX LOTN
1.0000 "application " | TOPICAL_LOTION | Freq: Every day | CUTANEOUS | Status: DC
  Administered 2020-02-24 – 2020-02-28 (×5): 1 via TOPICAL
  Filled 2020-02-23: qty 225

## 2020-02-23 MED ORDER — ACETAMINOPHEN 325 MG PO TABS
650.0000 mg | ORAL_TABLET | Freq: Four times a day (QID) | ORAL | Status: DC | PRN
  Administered 2020-02-23 – 2020-02-26 (×4): 650 mg via ORAL
  Filled 2020-02-23 (×4): qty 2

## 2020-02-23 MED ORDER — ACETAMINOPHEN 650 MG RE SUPP: 650.0000 mg | Freq: Four times a day (QID) | RECTAL | Status: DC | PRN

## 2020-02-23 MED ORDER — RISAQUAD PO CAPS
1.0000 | ORAL_CAPSULE | Freq: Every day | ORAL | Status: DC
  Administered 2020-02-24 – 2020-02-29 (×6): 1 via ORAL
  Filled 2020-02-23 (×7): qty 1

## 2020-02-23 MED ORDER — BACLOFEN 10 MG PO TABS
10.0000 mg | ORAL_TABLET | Freq: Three times a day (TID) | ORAL | Status: DC
Start: 2020-02-23 — End: 2020-02-25
  Administered 2020-02-23 – 2020-02-25 (×5): 10 mg via ORAL
  Filled 2020-02-23 (×5): qty 1

## 2020-02-23 MED ORDER — MONTELUKAST SODIUM 10 MG PO TABS
10.0000 mg | ORAL_TABLET | Freq: Every evening | ORAL | Status: DC
Start: 2020-02-23 — End: 2020-02-29
  Administered 2020-02-23 – 2020-02-28 (×6): 10 mg via ORAL
  Filled 2020-02-23 (×6): qty 1

## 2020-02-23 MED ORDER — ONDANSETRON HCL 4 MG PO TABS: 4.0000 mg | ORAL_TABLET | Freq: Three times a day (TID) | ORAL | Status: DC | PRN

## 2020-02-23 MED ORDER — GABAPENTIN 300 MG PO CAPS
300.0000 mg | ORAL_CAPSULE | Freq: Two times a day (BID) | ORAL | Status: DC
  Administered 2020-02-23 – 2020-02-25 (×4): 300 mg via ORAL
  Filled 2020-02-23 (×4): qty 1

## 2020-02-23 MED ORDER — POLYVINYL ALCOHOL 1.4 % OP SOLN
1.0000 [drp] | Freq: Two times a day (BID) | OPHTHALMIC | Status: DC
  Administered 2020-02-24 – 2020-02-28 (×10): 1 [drp] via OPHTHALMIC
  Filled 2020-02-23: qty 15

## 2020-02-23 MED ORDER — METRONIDAZOLE 500 MG PO TABS
500.0000 mg | ORAL_TABLET | Freq: Three times a day (TID) | ORAL | Status: DC
  Administered 2020-02-23 – 2020-02-24 (×2): 500 mg via ORAL
  Filled 2020-02-23 (×2): qty 1

## 2020-02-23 MED ORDER — DICLOFENAC SODIUM 1 % EX GEL: 1.0000 "application " | Freq: Four times a day (QID) | CUTANEOUS | Status: DC | PRN

## 2020-02-23 MED ORDER — NYSTATIN 100000 UNIT/ML MT SUSP
5.0000 mL | Freq: Every day | OROMUCOSAL | Status: DC
  Administered 2020-02-24 – 2020-02-28 (×5): 500000 [IU] via OROMUCOSAL
  Filled 2020-02-23 (×5): qty 5

## 2020-02-23 MED ORDER — VANCOMYCIN HCL 1500 MG/300ML IV SOLN
1500.0000 mg | Freq: Once | INTRAVENOUS | Status: AC
  Administered 2020-02-23: 1500 mg via INTRAVENOUS
  Filled 2020-02-23: qty 300

## 2020-02-23 MED ORDER — RIVAROXABAN 10 MG PO TABS
20.0000 mg | ORAL_TABLET | Freq: Every day | ORAL | Status: DC
  Administered 2020-02-23 – 2020-02-28 (×6): 20 mg via ORAL
  Filled 2020-02-23 (×2): qty 2
  Filled 2020-02-23: qty 1
  Filled 2020-02-23 (×3): qty 2

## 2020-02-23 MED ORDER — LORATADINE 10 MG PO TABS
10.0000 mg | ORAL_TABLET | Freq: Every day | ORAL | Status: DC
  Administered 2020-02-23 – 2020-02-25 (×3): 10 mg via ORAL
  Filled 2020-02-23 (×3): qty 1

## 2020-02-23 MED ORDER — MIRTAZAPINE 30 MG PO TBDP
30.0000 mg | ORAL_TABLET | Freq: Every day | ORAL | Status: DC
  Administered 2020-02-23 – 2020-02-25 (×3): 30 mg via ORAL
  Filled 2020-02-23 (×3): qty 1

## 2020-02-23 MED ORDER — ATORVASTATIN CALCIUM 10 MG PO TABS
10.0000 mg | ORAL_TABLET | Freq: Every day | ORAL | Status: DC
Start: 2020-02-23 — End: 2020-02-29
  Administered 2020-02-23 – 2020-02-28 (×6): 10 mg via ORAL
  Filled 2020-02-23 (×6): qty 1

## 2020-02-23 MED ORDER — LACTATED RINGERS IV BOLUS (SEPSIS)
1000.0000 mL | Freq: Once | INTRAVENOUS | Status: AC
  Administered 2020-02-23: 1000 mL via INTRAVENOUS

## 2020-02-23 MED ORDER — ALBUTEROL SULFATE HFA 108 (90 BASE) MCG/ACT IN AERS: 2.0000 | INHALATION_SPRAY | RESPIRATORY_TRACT | Status: DC | PRN

## 2020-02-23 MED ORDER — FLUCONAZOLE 100 MG PO TABS
100.0000 mg | ORAL_TABLET | Freq: Every day | ORAL | Status: AC
  Administered 2020-02-24 – 2020-02-26 (×3): 100 mg via ORAL
  Filled 2020-02-23 (×4): qty 1

## 2020-02-23 MED ORDER — PREDNISONE 20 MG PO TABS
30.0000 mg | ORAL_TABLET | Freq: Every day | ORAL | Status: DC
Start: 2020-02-23 — End: 2020-02-26
  Administered 2020-02-23 – 2020-02-26 (×4): 30 mg via ORAL
  Filled 2020-02-23: qty 2
  Filled 2020-02-23 (×3): qty 1

## 2020-02-23 MED ORDER — LIDOCAINE 5 % EX PTCH: 1.0000 | MEDICATED_PATCH | Freq: Every day | CUTANEOUS | Status: DC | PRN

## 2020-02-23 MED ORDER — MUPIROCIN 2 % EX OINT
1.0000 "application " | TOPICAL_OINTMENT | Freq: Every day | CUTANEOUS | Status: DC
  Administered 2020-02-24 – 2020-02-28 (×5): 1 via TOPICAL
  Filled 2020-02-23 (×2): qty 22

## 2020-02-23 MED ORDER — FOLIC ACID 1 MG PO TABS
1.0000 mg | ORAL_TABLET | Freq: Every day | ORAL | Status: DC
Start: 2020-02-23 — End: 2020-02-29
  Administered 2020-02-23 – 2020-02-29 (×7): 1 mg via ORAL
  Filled 2020-02-23 (×7): qty 1

## 2020-02-23 MED ORDER — VANCOMYCIN HCL IN DEXTROSE 1-5 GM/200ML-% IV SOLN: 1000.0000 mg | INTRAVENOUS | Status: DC

## 2020-02-23 MED ORDER — COLCHICINE 0.6 MG PO TABS
0.6000 mg | ORAL_TABLET | Freq: Every day | ORAL | Status: DC | PRN
Start: 2020-02-23 — End: 2020-02-29

## 2020-02-23 MED ORDER — LACTATED RINGERS IV SOLN: INTRAVENOUS | Status: AC

## 2020-02-23 MED ORDER — UREA 10 % EX LOTN
TOPICAL_LOTION | Freq: Every day | CUTANEOUS | Status: DC
  Filled 2020-02-23: qty 237

## 2020-02-23 MED ORDER — ALLOPURINOL 100 MG PO TABS
100.0000 mg | ORAL_TABLET | Freq: Every day | ORAL | Status: DC
Start: 2020-02-23 — End: 2020-02-29
  Administered 2020-02-23 – 2020-02-29 (×7): 100 mg via ORAL
  Filled 2020-02-23 (×8): qty 1

## 2020-02-23 MED ORDER — MOMETASONE FURO-FORMOTEROL FUM 200-5 MCG/ACT IN AERO
2.0000 | INHALATION_SPRAY | Freq: Two times a day (BID) | RESPIRATORY_TRACT | Status: DC
  Administered 2020-02-24 – 2020-02-29 (×8): 2 via RESPIRATORY_TRACT
  Filled 2020-02-23: qty 8.8

## 2020-02-23 MED ORDER — VANCOMYCIN HCL IN DEXTROSE 1-5 GM/200ML-% IV SOLN: 1000.0000 mg | Freq: Once | INTRAVENOUS | Status: DC

## 2020-02-23 MED ORDER — AZELASTINE HCL 0.1 % NA SOLN: 2.0000 | Freq: Every day | NASAL | Status: DC | PRN

## 2020-02-23 MED ORDER — UMECLIDINIUM BROMIDE 62.5 MCG/INH IN AEPB
1.0000 | INHALATION_SPRAY | Freq: Every day | RESPIRATORY_TRACT | Status: DC
  Administered 2020-02-24 – 2020-02-29 (×3): 1 via RESPIRATORY_TRACT
  Filled 2020-02-23 (×2): qty 7

## 2020-02-23 NOTE — ED Notes (Signed)
Staff able to get 1st set of cultures with multiple attempts for second. Provider aware to and agrees to start antibiotics

## 2020-02-23 NOTE — ED Notes (Signed)
Informed Zhang MD PO meds will be held d/t pt still fairly somnolent

## 2020-02-23 NOTE — ED Notes (Signed)
Dinner Tray Ordered @ 1713. 

## 2020-02-23 NOTE — Progress Notes (Signed)
EEG completed, results pending. 

## 2020-02-23 NOTE — ED Notes (Signed)
Bruising present to left cheek and left elbow.

## 2020-02-23 NOTE — ED Provider Notes (Signed)
[facility] EMERGENCY DEPARTMENT Provider Note   CSN: [id number] Arrival date & time: 02/23/20  [id number]     History No chief complaint on file.   Ebony Scott is a 70 y.o. 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 History is provided by daughter at the bedside. States that she spoke to the patient around 8 PM last night and was in her usual state of health. She did notice that she was "maybe a little bit confused during the day, like she didn't know what day it was. But when I talked to her at 8 she was completely normal." States that she went and checked on her today and found her laying on the floor, cool to touch, covered in urine with damp skin. She has not seen her since after her surgery last week so she is unsure of the amount of bruising on her right arm is new. States that she is on tramadol and Robaxin post surgery but has been only needing to take this about 1-2 times a day. She states that she has been on prednisone for her RA and this is caused the edema throughout her body. No known history of seizures. She has been compliant with her other medications daily.  HPI     Past Medical History:  Diagnosis Date  . Acute deep vein thrombosis (DVT) of right lower extremity ([place]) 12/13/2017  . Allergic rhinitis   . Allergy    SEASONAL  . Anemia   . Anxiety    pt denies  . Arthritis    Phreesia 10/21/2019  . Asthma   . Asthma    Phreesia 10/21/2019  . Breast cancer ([place]) 1998   in remission  . Cataract    REMOVED  . Clostridium difficile colitis 01/14/2018  . Clostridium difficile diarrhea 01/14/2018  . Complication of anesthesia    "had hard time waking up from it several times" (02/20/2012)  . Depression    "some; don't take anything for it" (02/20/2012)  . Diverticulosis   . DVT (deep venous thrombosis) ([place])    . Exertional dyspnea   . Fibromyalgia 11/2011  . GERD (gastroesophageal reflux disease)   . Graves disease   . Headache(784.0)    "related to allergies; more at different times during the year" (02/20/2012)  . Hemorrhoids   . Hiatal hernia    back and neck  . Hx of adenomatous colonic polyps 04/12/2016  . Hypercholesteremia    good cholesterol is high  . Hypothyroidism   . IBS (irritable bowel syndrome)   . Moderate persistent asthma    -FeV1 72% 2011, -IgE 102 2011, CT sinus Neg 2011  . Osteoporosis    on reclast yearly  . Peripheral vascular disease ([place]) 2019   DVTs  . Pneumonia 04/2011; ~ 11/2011   "double; single" (02/20/2012)  . Seronegative rheumatoid arthritis ([place])    Dr. Lahoma Rocker  . SIRS (systemic inflammatory response syndrome) ([place]) 02/10/2018  . Tracheobronchomalacia     Patient Active Problem List   Diagnosis Date Noted  . S/P reverse total shoulder arthroplasty, right   . Hypokalemia   . DOE (dyspnea on exertion) 02/18/2020  . 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 S/P reverse total shoulder arthroplasty, left 02/16/2020  . Preoperative clearance 01/26/2020  . Closed displaced fracture of metatarsal bone of right foot 01/20/2019  .  History of pulmonary embolus (PE) 12/19/2018  . 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 Rheumatoid arthritis ([place])   . Bilateral impacted cerumen 08/18/2018  . Fungal otitis externa 08/18/2018  . Asthmatic bronchitis with exacerbation 07/29/2018  . Pulmonary embolism ([place]) 07/29/2018  . Cellulitis of forearm   . Fever of unknown origin (FUO) 02/10/2018  . Abdominal pain 02/10/2018  . Fracture of base of fifth metatarsal bone with routine healing, left   . Closed fracture of fifth metatarsal bone of right foot, initial encounter 01/15/2018  . DVT (deep venous thrombosis) ([place]) 01/14/2018  . Hypothyroidism 01/14/2018  . Depression 01/14/2018  . Debility   . Malnutrition of moderate degree 12/17/2017  . Knee pain   .  History of breast cancer   . Chronic pain syndrome   . Fibromyalgia   . Graves disease   . Sepsis due to undetermined organism ([place]) 12/13/2017  . Rhabdomyolysis 12/13/2017  . Dysphonia 08/10/2017  . Multiple lung nodules on CT   . CKD (chronic kidney disease), stage III ([place]) 04/15/2016  . Hx of adenomatous colonic polyps 04/12/2016  . Cough 02/09/2016  . Opacity of lung on imaging study 11/03/2015  . Severe persistent asthma 02/08/2015  . Facial pain syndrome 02/08/2015  . Insomnia 02/08/2015  . Other allergic rhinitis 02/08/2015  . LPRD (laryngopharyngeal reflux disease) 02/08/2015  . Chronic diastolic CHF (congestive heart failure) ([place]) 02/08/2015  . Benign paroxysmal positional vertigo 12/03/2012  . Dizziness and giddiness 10/29/2012  . Disequilibrium 10/29/2012  . Pseudogout 02/24/2012  . Pneumonia 05/31/2011  . Irritable bowel syndrome 01/02/2010  . Hyperlipidemia 12/23/2009  . Hyperthyroidism 11/12/2006  . Seasonal and perennial allergic rhinitis 11/12/2006  . GERD 11/12/2006  . NECK PAIN, CHRONIC 11/12/2006  . OSTEOPOROSIS 11/12/2006  . BREAST CANCER, HX OF 11/12/2006    Past Surgical History:  Procedure Laterality Date  . ABDOMINAL HYSTERECTOMY N/A    Phreesia 10/21/2019  . ANTERIOR AND POSTERIOR REPAIR  1990's  . APPENDECTOMY    . BREAST LUMPECTOMY  1998   left  . BREAST SURGERY N/A    Phreesia 10/21/2019  . CARPOMETACARPEL ([place]) FUSION OF THUMB WITH AUTOGRAFT FROM RADIUS  ~ 2009   "both thumbs" (02/20/2012)  . CATARACT EXTRACTION W/ INTRAOCULAR LENS  IMPLANT, BILATERAL  2012  . CERVICAL DISCECTOMY  10/2001   C5-C6  . CERVICAL FUSION  2003   C3-C4  . CHOLECYSTECTOMY    . COLONOSCOPY    . DEBRIDEMENT TENNIS ELBOW  ?1970's   right  . ESOPHAGOGASTRODUODENOSCOPY    . EYE SURGERY N/A    Phreesia 10/21/2019  . HYSTERECTOMY    . KNEE ARTHROPLASTY  ?1990's   "?right; w/cartilage repair" (02/20/2012)  . NASAL SEPTUM SURGERY  1980's  . POSTERIOR CERVICAL  FUSION/FORAMINOTOMY  2004   "failed initial fusion; rewired  anterior neck" (02/20/2012)  . REVERSE SHOULDER ARTHROPLASTY Right 02/16/2020   Procedure: REVERSE SHOULDER ARTHROPLASTY;  Surgeon: Marchia Bond, MD;  Location: [facility];  Service: Orthopedics;  Laterality: Right;  . SPINE SURGERY N/A    Phreesia 10/21/2019  . TONSILLECTOMY  ~ 1953  . VESICOVAGINAL FISTULA CLOSURE W/ TAH  1988  . VIDEO BRONCHOSCOPY Bilateral 08/23/2016   Procedure: VIDEO BRONCHOSCOPY WITH FLUORO;  Surgeon: Javier Glazier, MD;  Location: [facility];  Service: Cardiopulmonary;  Laterality: Bilateral;     OB History   No obstetric history on file.     Family History  Problem Relation Age of Onset  . Allergies Mother   . Heart disease  Mother   . Arthritis Mother   . Lung cancer Mother   . Diabetes Mother   . Allergies Father   . Heart disease Father   . Arthritis Father   . Stroke Father   . Colon cancer Other        Maternal half aunt/Maternal half uncle  . Colitis Daughter   . Diabetes Maternal Grandfather     Social History   Tobacco Use  . Smoking status: Passive Smoke Exposure - Never Smoker  . Smokeless tobacco: Never Used  . Tobacco comment: Parents  Vaping Use  . Vaping Use: Never used  Substance Use Topics  . Alcohol use: Not Currently    Alcohol/week: 0.0 standard drinks  . Drug use: No    Home Medications Prior to Admission medications   Medication Sig Start Date End Date Taking? Authorizing Provider  acetaminophen (TYLENOL) 650 MG CR tablet Take 1,300 mg by mouth 2 (two) times daily.    [provider]  albuterol (PROAIR HFA) 108 (90 Base) MCG/ACT inhaler Inhale 2 puffs into the lungs every 4 (four) hours as needed for wheezing or shortness of breath. 06/17/19   Karie Fetch P, DO  albuterol (PROVENTIL) (2.5 MG/3ML) 0.083% nebulizer solution Take 3 mLs (2.5 mg total) by nebulization in the morning and at bedtime. Along with flutter valve 02/04/20   Karie Fetch P, DO   allopurinol (ZYLOPRIM) 100 MG tablet Take 100 mg by mouth daily. 11/19/19   [provider]  Alpha-D-Galactosidase (BEANO PO) Take 2-3 capsules by mouth 3 (three) times daily with meals.    [provider]  Alpha-Lipoic Acid 600 MG CAPS Take 600 mg by mouth daily.     [provider]  ammonium lactate (AMLACTIN) 12 % cream Apply topically as needed for dry skin. Patient taking differently: Apply 1 g topically at bedtime. 07/30/19   Vivi Barrack, DPM  atorvastatin (LIPITOR) 10 MG tablet TAKE 1 TABLET BY MOUTH DAILY Patient taking differently: Take 10 mg by mouth daily. 04/17/19   [name], MD  azelastine (ASTELIN) 0.1 % nasal spray USE 2 SPRAYS IN EACH NOSTRIL DAILY AS DIRECTED Patient taking differently: Place 2 sprays into both nostrils daily as needed for allergies. 01/02/19   Kozlow, [name], MD  baclofen (LIORESAL) 10 MG tablet Take 1 tablet (10 mg total) by mouth 3 (three) times daily. As needed for muscle spasm 02/19/20   Janine Ores K, PA-C  CALCIUM-MAGNESIUM PO Take 1 tablet by mouth 2 (two) times daily.     [provider]  cetirizine (ZYRTEC) 10 MG tablet Take 10 mg by mouth daily.    [provider]  cholecalciferol (VITAMIN D) 25 MCG (1000 UT) tablet Take 1,000 Units by mouth daily.     [provider]  cyproheptadine (PERIACTIN) 4 MG tablet TAKE 2 TABLETS BY MOUTH EVERY EVENING Patient taking differently: Take 8 mg by mouth every evening. 02/02/20   [name], MD  denosumab (PROLIA) 60 MG/ML SOLN injection Inject 60 mg into the skin every 6 (six) months. Administer in upper arm, thigh, or abdomen    [provider]  DEXILANT 60 MG capsule TAKE 1 CAPSULE BY MOUTH EACH MORNING Patient taking differently: Take 60 mg by mouth daily. 02/02/20   [name], MD  diclofenac Sodium (VOLTAREN) 1 % GEL APPLY 2 GRAMS TOPICALLY TWICE DAILY Patient taking differently: Apply 1 application topically 4 (four)  times daily. 09/10/19   [name], MD  DULERA 200-5 MCG/ACT AERO INHALE 2 PUFFS BY MOUTH TWICE DAILY Patient taking differently: Inhale 2 puffs into the lungs 2 (two) times daily. 02/03/20   [name], MD  EPINEPHrine (EPIPEN 2-PAK) 0.3 mg/0.3 mL IJ SOAJ injection Inject 0.3 mLs (0.3 mg total) into the muscle Once PRN. Patient taking differently: Inject 0.3 mg into the muscle as needed for anaphylaxis. 01/23/19   [name], MD  famotidine (PEPCID) 20 MG tablet TAKE 1 TABLET BY MOUTH TWICE A DAY Patient taking differently: Take 20 mg by mouth 2 (two) times daily. 02/01/20   Kozlow, Donnamarie Poag, MD  fluconazole (DIFLUCAN) 100 MG tablet Take 1 tablet (100 mg total) by mouth daily for 7 days. 02/19/20 02/26/20  Merlene Pulling K, PA-C  fluticasone (FLONASE) 50 MCG/ACT nasal spray Place 2 sprays into both nostrils daily. 04/01/19   Julian Hy, DO  folic acid (FOLVITE) 1 MG tablet Take 1 mg by mouth daily.    [provider]  furosemide (LASIX) 40 MG tablet TAKE 1 TABLET BY MOUTH DAILY AS NEEDED Patient taking differently: Take 20-40 mg by mouth See admin instructions. Take 40 mg in the morning and 20 mg in the evening 11/05/19   [name], MD  gabapentin (NEURONTIN) 600 MG tablet TAKE 1 TABLET BY MOUTH DAILY Patient taking differently: Take 600 mg by mouth daily. 02/03/20   [name], MD  guaiFENesin (MUCINEX) 600 MG 12 hr tablet Take 600 mg by mouth 2 (two) times daily.    [provider]  hydrocortisone cream 0.5 % APPLY TO THE AFFECTED AREA(S) AS DIRECTED Patient not taking: No sig reported 02/02/20   [name], MD  Lactase 9000 units TABS Take 18,000-27,000 Units by mouth 4 (four) times daily as needed (dairy consumption).    [provider]  leflunomide (ARAVA) 20 MG tablet Take 20 mg by mouth daily. 12/14/19   [provider]  lidocaine (LIDODERM) 5 % Place 1 patch onto the skin daily as needed (pain). Remove & Discard  patch within 12 hours or as directed by MD 01/23/18   Aline August, MD  magnesium oxide (MAG-OX) 400 MG tablet Take 400 mg by mouth at bedtime.    [provider]  mirtazapine (REMERON SOL-TAB) 30 MG disintegrating tablet DISSOLVE 1 TABLET BY MOUTH EVERY NIGHT AT BEDTIME Patient taking differently: Take 30 mg by mouth at bedtime. 02/02/20   [name], MD  MITIGARE 0.6 MG CAPS Take 0.6 mg by mouth every other day. 01/16/20   [provider]  montelukast (SINGULAIR) 10 MG tablet TAKE 1 TABLET BY MOUTH DAILY Patient taking differently: Take 10 mg by mouth every evening. 02/01/20   Kozlow, Donnamarie Poag, MD  mupirocin ointment (BACTROBAN) 2 % Apply 1 application topically 2 (two) times daily. Patient taking differently: Apply 1 application topically at bedtime. 10/05/19   Trula Slade, DPM  Nystatin (GERHARDT'S BUTT CREAM) CREA Apply 1 application topically daily as needed (hemorrhoids). NYSTATIN 100,000 UNIT/GM CR 100000 CRM- 60 g, ZINC OXIDE 20 % OINT 20 OINT- 60 g, HYDROCORTISONE 1 % CREAM- 60 g 02/15/20   [name], MD  nystatin (MYCOSTATIN) 100000 UNIT/ML suspension SWISH AND SPIT 5 MLS TWICE DAILY Patient taking differently: Use as directed 5 mLs in the mouth or throat at bedtime. 08/27/19   Kozlow, Donnamarie Poag, MD  nystatin-triamcinolone ointment [facility]) Apply topically 2 (two) times daily. Patient taking differently: Apply 1 application topically 2 (two) times daily as needed (  yeast infections). 07/07/19   [name], MD  ondansetron (ZOFRAN) 4 MG tablet Take 1 tablet (4 mg total) by mouth every 8 (eight) hours as needed for nausea or vomiting. 02/19/20   Merlene Pulling K, PA-C  predniSONE (DELTASONE) 5 MG tablet Take 15 mg by mouth daily with breakfast.    [provider]  Prenatal Vit-Fe Fumarate-FA (PRENATAL MULTIVITAMIN) TABS tablet Take 1 tablet by mouth daily.    [provider]  Probiotic Product (ALIGN PO) Take 1 capsule by mouth daily.     [provider]  Propylene Glycol (SYSTANE COMPLETE) 0.6 % SOLN Place 1 drop into both eyes 2 (two) times daily.    [provider]  Respiratory Therapy Supplies (FLUTTER) DEVI Use as directed 10/22/14   Elsie Stain, MD  Salicylic Acid-Urea [facility] EX) Apply 1 application topically at bedtime.    [provider]  sodium chloride HYPERTONIC 3 % nebulizer solution Take by nebulization 2 (two) times daily. Patient taking differently: Take 4 mLs by nebulization 2 (two) times daily. 02/02/20   Julian Hy, DO  Spacer/Aero-Holding Chambers (AEROCHAMBER PLUS) inhaler Use as instructed 07/29/18   Parrett, Fonnie Mu, NP  Tiotropium Bromide Monohydrate (SPIRIVA RESPIMAT) 2.5 MCG/ACT AERS Inhale 1 puff into the lungs daily. 05/01/19   Julian Hy, DO  traMADol (ULTRAM) 50 MG tablet Take 1 tablet (50 mg total) by mouth every 6 (six) hours as needed. 02/19/20   Merlene Pulling K, PA-C  XARELTO 20 MG TABS tablet TAKE 1 TABLET BY MOUTH DAILY WITH SUPPER Patient taking differently: Take 20 mg by mouth every evening. 02/02/20   [name], MD    Allergies    Dust mite extract, Molds & smuts, Morphine and related, Other, Penicillins, Reclast [zoledronic acid], Rofecoxib, Shellfish allergy, Tetracycline hcl, Xolair [omalizumab], Dilaudid [hydromorphone hcl], Hydrocodone-acetaminophen, Levofloxacin, Oxycodone hcl, Paroxetine, Celecoxib, Diltiazem, Lactose intolerance (gi), and Tree extract  Review of Systems   Review of Systems  Unable to perform ROS: Mental status change    Physical Exam Updated Vital Signs BP 139/74   Pulse (!) 101   Temp (!) 97.5 F (36.4 C) (Tympanic)   Resp 18   SpO2 100%   Physical Exam Vitals and nursing note reviewed.  Constitutional:      General: She is not in acute distress.    Appearance: She is well-developed and well-nourished.  HENT:     Head: Normocephalic and atraumatic.     Nose: Nose normal.  Eyes:     General: No scleral  icterus.       Right eye: No discharge.        Left eye: No discharge.     Extraocular Movements: EOM normal.     Conjunctiva/sclera: Conjunctivae normal.     Pupils: Pupils are equal, round, and reactive to light.  Cardiovascular:     Rate and Rhythm: Regular rhythm. Tachycardia present.     Pulses: Intact distal pulses.     Heart sounds: Normal heart sounds. No murmur heard. No friction rub. No gallop.   Pulmonary:     Effort: Pulmonary effort is normal. Tachypnea present. No respiratory distress.     Breath sounds: Examination of the right-upper field reveals wheezing. Examination of the left-upper field reveals wheezing. Examination of the right-middle field reveals wheezing. Examination of the left-middle field reveals wheezing. Wheezing present.  Abdominal:     General: Bowel sounds are normal. There is no distension.     Palpations: Abdomen  is soft.     Tenderness: There is no abdominal tenderness. There is no guarding.  Musculoskeletal:        General: Tenderness (Right shoulder) present. Normal range of motion.     Cervical back: Normal range of motion and neck supple.     Left lower leg: Edema present.     Comments: No deformities noted. No C, T or L-spine tenderness to palpation. 2+ DP pulse noted bilaterally.  Skin:    General: Skin is dry.     Findings: Bruising (Entire right upper extremity and right lateral breast) present. No rash.     Comments: Skin is cool to touch.  Neurological:     Mental Status: She is alert.     Cranial Nerves: No cranial nerve deficit.     Motor: No abnormal muscle tone.     Coordination: Coordination normal.     Comments: No gross facial asymmetry noted. Patient able to follow exam but unwilling to participate in exam. Will grunt from time to time.  Psychiatric:        Mood and Affect: Mood and affect normal.     ED Results / Procedures / Treatments   Labs (all labs ordered are listed, but only abnormal results are displayed) Labs  Reviewed  COMPREHENSIVE METABOLIC PANEL - Abnormal; Notable for the following components:      Result Value   Chloride 96 (*)    Glucose, Bld 187 (*)    BUN 48 (*)    Creatinine, Ser 1.52 (*)    Total Protein 6.1 (*)    Albumin 2.9 (*)    AST 67 (*)    ALT 115 (*)    Total Bilirubin 1.5 (*)    GFR, Estimated 36 (*)    Anion gap 18 (*)    All other components within normal limits  CBC - Abnormal; Notable for the following components:   WBC 12.5 (*)    RBC 3.03 (*)    Hemoglobin 10.6 (*)    HCT 32.6 (*)    MCV 107.6 (*)    MCH 35.0 (*)    RDW 20.7 (*)    nRBC 0.7 (*)    All other components within normal limits  LACTIC ACID, PLASMA - Abnormal; Notable for the following components:   Lactic Acid, Venous 3.8 (*)    All other components within normal limits  PROTIME-INR - Abnormal; Notable for the following components:   Prothrombin Time 22.4 (*)    INR 2.1 (*)    All other components within normal limits  CK - Abnormal; Notable for the following components:   Total CK 468 (*)    All other components within normal limits  I-STAT CHEM 8, ED - Abnormal; Notable for the following components:   Potassium 3.4 (*)    BUN 44 (*)    Creatinine, Ser 1.40 (*)    Glucose, Bld 174 (*)    Calcium, Ion 0.77 (*)    Hemoglobin 11.2 (*)    HCT 33.0 (*)    All other components within normal limits  RESP PANEL BY RT-PCR (FLU A&B, COVID) ARPGX2  CULTURE, BLOOD (ROUTINE X 2)  CULTURE, BLOOD (ROUTINE X 2)  ETHANOL  URINALYSIS, ROUTINE W REFLEX MICROSCOPIC  RAPID URINE DRUG SCREEN, HOSP PERFORMED  AMMONIA  BLOOD GAS, VENOUS  TSH  VITAMIN B12  FOLATE  CBG MONITORING, ED  SAMPLE TO BLOOD BANK    EKG EKG Interpretation  Date/Time:  Tuesday February 23 2020  09:38:46 EST Ventricular Rate:  106 PR Interval:    QRS Duration: 80 QT Interval:  341 QTC Calculation: 453 R Axis:   38 Text Interpretation: Sinus tachycardia LVH with secondary repolarization abnormality Anterior infarct, old  No STEMI Confirmed by Alona Bene [phone]) on 02/23/2020 9:46:40 AM   Radiology CT HEAD WO CONTRAST  Result Date: 02/23/2020 CLINICAL DATA:  Trauma.  Fall. EXAM: CT HEAD WITHOUT CONTRAST CT CERVICAL SPINE WITHOUT CONTRAST TECHNIQUE: Multidetector CT imaging of the head and cervical spine was performed following the standard protocol without intravenous contrast. Multiplanar CT image reconstructions of the cervical spine were also generated. COMPARISON:  Radiographs October 09, 2013. CT head March 06, 2018. FINDINGS: CT HEAD FINDINGS Brain: No evidence of acute large vascular territory infarction, hemorrhage, hydrocephalus, extra-axial collection or mass lesion/mass effect. Patchy white matter hypoattenuation, most likely related to chronic microvascular ischemic disease. Mild generalized cerebral volume loss with ex vacuo ventricular dilation. Vascular: Calcific atherosclerosis. Skull: No acute fracture. Sinuses/Orbits: Ethmoid air cell mucosal thickening without air-fluid levels. Unremarkable orbits. Other: No mastoid effusions. CT CERVICAL SPINE FINDINGS Nondiagnostic evaluation at the craniocervical junction secondary to motion. Within this limitation: Alignment: There is approximately 5 mm of anterolisthesis of C4 on C5, favored chronic/degenerative given anterolisthesis on prior radiographs from October 09, 2013. Otherwise, no substantial sagittal subluxation. Skull base and vertebrae: Osseous fusion of the C6-C7 vertebral bodies. ACDF spanning C3-C4. No evidence of acute fracture. Vertebral body heights are maintained. Post cerclage wires at C6-C7. Soft tissues and spinal canal: No visible prevertebral fluid or swelling. No visible canal hematoma, although evaluation of the canal is limited by motion. Disc levels: Multilevel facet hypertrophy. Multilevel mild degenerative disc disease. Upper chest: Negative. IMPRESSION: 1. No evidence of acute intracranial abnormality. 2. No evidence of acute cervical spine  fracture, although evaluation at the craniocervical junction is nondiagnostic secondary to motion. 3. Approximately 5 mm of anterolisthesis of C4 on C5, favored chronic/degenerative given anterolisthesis on prior radiographs from October 09, 2013. Electronically Signed   By: Feliberto Harts MD   On: 02/23/2020 10:57   CT CERVICAL SPINE WO CONTRAST  Result Date: 02/23/2020 CLINICAL DATA:  Trauma.  Fall. EXAM: CT HEAD WITHOUT CONTRAST CT CERVICAL SPINE WITHOUT CONTRAST TECHNIQUE: Multidetector CT imaging of the head and cervical spine was performed following the standard protocol without intravenous contrast. Multiplanar CT image reconstructions of the cervical spine were also generated. COMPARISON:  Radiographs October 09, 2013. CT head March 06, 2018. FINDINGS: CT HEAD FINDINGS Brain: No evidence of acute large vascular territory infarction, hemorrhage, hydrocephalus, extra-axial collection or mass lesion/mass effect. Patchy white matter hypoattenuation, most likely related to chronic microvascular ischemic disease. Mild generalized cerebral volume loss with ex vacuo ventricular dilation. Vascular: Calcific atherosclerosis. Skull: No acute fracture. Sinuses/Orbits: Ethmoid air cell mucosal thickening without air-fluid levels. Unremarkable orbits. Other: No mastoid effusions. CT CERVICAL SPINE FINDINGS Nondiagnostic evaluation at the craniocervical junction secondary to motion. Within this limitation: Alignment: There is approximately 5 mm of anterolisthesis of C4 on C5, favored chronic/degenerative given anterolisthesis on prior radiographs from October 09, 2013. Otherwise, no substantial sagittal subluxation. Skull base and vertebrae: Osseous fusion of the C6-C7 vertebral bodies. ACDF spanning C3-C4. No evidence of acute fracture. Vertebral body heights are maintained. Post cerclage wires at C6-C7. Soft tissues and spinal canal: No visible prevertebral fluid or swelling. No visible canal hematoma, although  evaluation of the canal is limited by motion. Disc levels: Multilevel facet hypertrophy. Multilevel mild degenerative disc disease. Upper chest: Negative.  IMPRESSION: 1. No evidence of acute intracranial abnormality. 2. No evidence of acute cervical spine fracture, although evaluation at the craniocervical junction is nondiagnostic secondary to motion. 3. Approximately 5 mm of anterolisthesis of C4 on C5, favored chronic/degenerative given anterolisthesis on prior radiographs from October 09, 2013. Electronically Signed   By: Margaretha Sheffield MD   On: 02/23/2020 10:57   DG Pelvis Portable  Result Date: 02/23/2020 CLINICAL DATA:  Unwitnessed fall EXAM: PORTABLE PELVIS 1-2 VIEWS COMPARISON:  None. FINDINGS: There is no evidence of pelvic fracture or dislocation. Mild symmetric narrowing of each hip joint noted. Degenerative change noted in visualized lower lumbar spine. No erosive change. IMPRESSION: Mild narrowing each hip joint.  No fracture or dislocation. Electronically Signed   By: Lowella Grip III M.D.   On: 02/23/2020 10:21   DG Chest Port 1 View  Result Date: 02/23/2020 CLINICAL DATA:  Trauma.  Found on floor. EXAM: PORTABLE CHEST 1 VIEW COMPARISON:  02/18/2020 FINDINGS: Cardiac and mediastinal contours normal. Vascularity normal. Mild atelectasis in the bases. No effusion. Apical pleural thickening bilaterally. Right shoulder replacement.  Prior cervical spine for surgery. IMPRESSION: Mild bibasilar atelectasis. Electronically Signed   By: Franchot Gallo M.D.   On: 02/23/2020 10:21   DG Shoulder Right Port  Result Date: 02/23/2020 CLINICAL DATA:  Pain following fall EXAM: PORTABLE RIGHT SHOULDER: 2 + V COMPARISON:  February 16, 2020. FINDINGS: Oblique, frontal, and Y scapular images were obtained. 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 No acute fracture or evident dislocation. Extensive bursal calcification laterally appear stable. No erosion.  Visualized right lung clear. IMPRESSION: Total shoulder replacement with prosthetic components appearing well-seated. No acute fracture or dislocation. Extensive bursal calcification is stable. Electronically Signed   By: Lowella Grip III M.D.   On: 02/23/2020 10:24    Procedures .Critical Care Performed by: Delia Heady, PA-C Authorized by: Delia Heady, PA-C   Critical care provider statement:    Critical care time (minutes):  45   Critical care was necessary to treat or prevent imminent or life-threatening deterioration of the following conditions:  Circulatory failure, CNS failure or compromise and sepsis   Critical care was time spent personally by me on the following activities:  Development of treatment plan with patient or surrogate, discussions with consultants, evaluation of patient's response to treatment, examination of patient, obtaining history from patient or surrogate, ordering and performing treatments and interventions, ordering and review of laboratory studies, ordering and review of radiographic studies, re-evaluation of patient's condition, review of old charts and pulse oximetry   I assumed direction of critical care for this patient from another provider in my specialty: no     (including critical care time)  Medications Ordered in ED Medications  lactated ringers infusion (has no administration in time range)  metroNIDAZOLE (FLAGYL) IVPB 500 mg (has no administration in time range)  ceFEPIme (MAXIPIME) 2 g in sodium chloride 0.9 % 100 mL IVPB (has no administration in time range)  vancomycin (VANCOREADY) IVPB 1500 mg/300 mL (has no administration in time range)  sodium chloride 0.9 % bolus 1,000 mL (1,000 mLs Intravenous New Bag/Given 02/23/20 1305)    ED Course  I have reviewed the triage vital signs and the nursing notes.  Pertinent labs & imaging results that were available during my care of the patient were reviewed by me and considered in my medical  decision making (see chart for details).  Clinical Course as of 02/23/20 1444  Tue Feb 23, 2020  1033 DG Shoulder Right Port No acute findings. [HK]  [id number] DG Pelvis Portable No fracture or dislocation. [HK]  1212 Creatinine(!): 1.52 Appears this is around her baseline. [HK]  1213 Total Bilirubin(!): 1.5 [HK]  1218 Lactic Acid, Venous(!!): 3.8 Will order IV fluids. 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  History provided by daughter at the bedside.  States that she was in her usual state of health at 8 PM last night.  Daughter found her this morning laying on the floor, cool to the touch covered in urine with damp skin.  She states that she has been doing well since her surgery. She spoke to patient's son who states the bruising in her arm has been there since her surgery.  No known history of seizures.  She takes tramadol and Robaxin as needed after her surgery but only takes it about 1-2 times a day.  On exam patient is alert but groaning.  Moving all extremities without difficulty.  She does have some wheezing in bilateral upper lung fields.  Significant ecchymosis noted of the right upper extremity.  Skin is cool to the touch.  Rectal temperature is normal.  She is tachycardic on arrival.  X-ray of the chest, pelvis and right shoulder without any abnormalities.  CT of the brain and cervical spine without any abnormalities.  Lab work shows creatinine at baseline.  She does have a mild leukocytosis of 12.5. Slight anion gap elevation at 18.  Lactic acid level elevated at 3.8.  No source of  infection but with her leukocytosis, lactic acidosis and tachycardia will cover with broad-spectrum antibiotics.  Normal ammonia level.  CK slightly elevated.  Urinalysis shows no signs of infection. Patient remains hemodynamically stable here but is still not at her mental baseline.  I am unsure exactly what may have caused this incident.  Question whether this could have been a seizure.  I will consult neurology she discussed whether she qualifies for an EEG.  She may need work-up for meningitis but unfortunately we are unable to LP her today due to her anticoagulant use. Will admit to medicine service for further evaluation.  2:43 PM Consult with Dr. Lorrin Goodell, on-call neurologist.  He recommends encephalopathy work-up including TSH, vitamin B12 level and folate level as well as routine EEG that would be reasonable in this patient.  If further recommendations are needed, he asked to be reconsulted by hospitalist service.  I have ordered this EEG as well as the labs. Appreciate the help of hospitalist and neurologist for management of this patient.  Portions of this note were generated with [employer]. Dictation errors may occur despite best attempts at proofreading.  Final Clinical Impression(s) / ED Diagnoses Final diagnoses:  Trauma  Altered mental status, unspecified altered mental status type    Rx / DC Orders ED Discharge Orders    None       Delia Heady, PA-C 02/23/20 1444  Isla Pence, MD 02/24/20 [phone]

## 2020-02-23 NOTE — ED Triage Notes (Signed)
Pt here found in the floor by daughter last seen last night around 8 pm , pt altered and is on blood thinners , cbg 274  By ems

## 2020-02-23 NOTE — ED Notes (Signed)
Surgical dressing in place to right shoulder. Dark purple bruising present to right shoulder, right upper extremity, and right breast.

## 2020-02-23 NOTE — ED Notes (Signed)
Daughter states that pt was covered in urine when she found her in the floor

## 2020-02-23 NOTE — Progress Notes (Addendum)
Pharmacy Antibiotic Note  Ebony Scott is a 70 y.o. female admitted on 02/23/2020 with sepsis from unknown source.  Pharmacy has been consulted for vancomycin and aztreonam dosing. Patient with low risk penicillin allergy, has tolerated cephalosporins including cefepime in the past. Changed aztreonam to cefepime.  CrCl 32.4 ml/min with Scr around baseline 1.4. WBC 12.5, afebrile.   Plan: Vancomycin 1500 mg load then 1000 mg IV every 36 hours. Goal AUC 400-550. Predicted AUC 500, Scr 1.4. Cefepime 2 g q12h Monitor renal function, culture results, clinical response    Temp (24hrs), Avg:97.9 F (36.6 C), Min:97.5 F (36.4 C), Max:98.2 F (36.8 C)  Recent Labs  Lab 02/17/20 0252 02/18/20 0320 02/18/20 0700 02/19/20 0817 02/23/20 0944 02/23/20 1018  WBC 13.7* 11.2*  --  11.5* 12.5*  --   CREATININE 1.39*  --  1.71* 1.26* 1.52* 1.40*  LATICACIDVEN  --   --   --   --  3.8*  --     Estimated Creatinine Clearance: 32.4 mL/min (A) (by C-G formula based on SCr of 1.4 mg/dL (H)).    Allergies  Allergen Reactions  . Dust Mite Extract Shortness Of Breath and Other (See Comments)    "sneezing" (02/20/2012)  . Molds & Smuts Shortness Of Breath  . Morphine And Related Hives and Itching  . Other Shortness Of Breath and Other (See Comments)    Grass and weeds "sneezing; filled sinuses" (02/20/2012)  . Penicillins Rash and Other (See Comments)    "welts" (02/20/2012) Has patient had a PCN reaction causing immediate rash, facial/tongue/throat swelling, SOB or lightheadedness with hypotension: Unknown Has patient had a PCN reaction causing severe rash involving mucus membranes or skin necrosis: No Has patient had a PCN reaction that required hospitalization: No Has patient had a PCN reaction occurring within the last 10 years: No If all of the above answers are "NO", then may proceed with Cephalosporin use. Tolerated Ancef 02/16/20.   Marland Kitchen Reclast [Zoledronic Acid] Other (See Comments)    Fever, Put  in hospital, dr said it was a reaction from a reaction   . Rofecoxib Swelling    Vioxx REACTION: feet swelling  . Shellfish Allergy Anaphylaxis, Shortness Of Breath, Itching and Rash  . Tetracycline Hcl Nausea And Vomiting  . Xolair [Omalizumab] Other (See Comments)    Caused Blood clot  . Dilaudid [Hydromorphone Hcl] Itching  . Hydrocodone-Acetaminophen Nausea And Vomiting  . Levofloxacin Other (See Comments)    GI upset  . Oxycodone Hcl Nausea And Vomiting  . Paroxetine Nausea And Vomiting    Paxil   . Celecoxib Swelling    Feet swelling  . Diltiazem Swelling  . Lactose Intolerance (Gi) Other (See Comments)    Bloating and gas  . Tree Extract Other (See Comments)    "tested and told I was allergic to it; never experienced a reaction to it" (02/20/2012)    Antimicrobials this admission: Vanc 1/4 >>  Cefepime 1/4 >>   Dose adjustments this admission:  Microbiology results: 1/4 BCx: pending  Thank you for allowing pharmacy to be a part of this patient's care.  Laverna Peace, PharmD PGY-1 Pharmacy Resident 02/23/2020 3:40 PM Please see AMION for all pharmacy numbers

## 2020-02-23 NOTE — H&P (Addendum)
History and Physical    Ebony Scott WUJ:[id number] DOB: 1950/03/31 DOA: 02/23/2020  PCP: [name], MD (Confirm with patient/family/NH records and if not entered, this has to be entered at [facility] point of entry) Patient coming from: Home  I have personally briefly reviewed patient's old medical records in [facility]  Chief Complaint: "I do remember what had happened"  HPI: Ebony Scott is a 70 y.o. female with medical history significant of DVT on Xarelto, mild intermittent asthma, severe tracheobronchial malacia on at bedtime CPAP, chronic diastolic CHF, CKD stage II, osteoporosis, rheumatoid arthritis on chronic steroid, breast cancer in remission, recent right shoulder arthroplasty on 02/19/2020, presented with altered mental status.  Patient is still confused, so, most history provided by daughter at bedside.  Patient underwent right shoulder surgery 5 days ago and sent home.  Daughter reported patient has a history of increased sensitivity to narcotics, " small dose of Dilaudid would knock her out completely".  In fact, inpatient allergy list shows that most of narcotics such as oxycodone, hydrocodone, Dilaudid, morphine are listed as allergies.  Patient was last seen normal yesterday, daughter talked to her over the phone around 530 yesterday evening and found patient " somewhat slow speech" but no other complaints, specifically no complaining about surgical pain.  And daughter promised her will come to her house in the morning.  Daughter went into her house this morning found patient lying in the couch with her right shoulder on ice pack, unresponsive.  Apparently overnight she was not sleeping in her bed overnight or using her CPAP.  ED Course: CT head and neck negative for acute trauma.  UA negative for UTI, chest x-ray negative for pneumonia.  Patient remained lethargic in the ED.  Vital signs: borderline tachycardia, no hypoxia.  No temperature.  Lactic acid 3.8.  UDS  negative  Review of Systems: Unable to perform patient lethargic and confused  Past Medical History:  Diagnosis Date  . Acute deep vein thrombosis (DVT) of right lower extremity (HCC) 12/13/2017  . Allergic rhinitis   . Allergy    SEASONAL  . Anemia   . Anxiety    pt denies  . Arthritis    Phreesia 10/21/2019  . Asthma   . Asthma    Phreesia 10/21/2019  . Breast cancer (HCC) 1998   in remission  . Cataract    REMOVED  . Clostridium difficile colitis 01/14/2018  . Clostridium difficile diarrhea 01/14/2018  . Complication of anesthesia    "had hard time waking up from it several times" (02/20/2012)  . Depression    "some; don't take anything for it" (02/20/2012)  . Diverticulosis   . DVT (deep venous thrombosis) (HCC)   . Exertional dyspnea   . Fibromyalgia 11/2011  . GERD (gastroesophageal reflux disease)   . Graves disease   . Headache(784.0)    "related to allergies; more at different times during the year" (02/20/2012)  . Hemorrhoids   . Hiatal hernia    back and neck  . Hx of adenomatous colonic polyps 04/12/2016  . Hypercholesteremia    good cholesterol is high  . Hypothyroidism   . IBS (irritable bowel syndrome)   . Moderate persistent asthma    -FeV1 72% 2011, -IgE 102 2011, CT sinus Neg 2011  . Osteoporosis    on reclast yearly  . Peripheral vascular disease (HCC) 2019   DVTs  . Pneumonia 04/2011; ~ 11/2011   "double; single" (02/20/2012)  . Seronegative rheumatoid arthritis (  [place])    Dr. Lahoma Rocker  . SIRS (systemic inflammatory response syndrome) ([place]) 02/10/2018  . Tracheobronchomalacia     Past Surgical History:  Procedure Laterality Date  . ABDOMINAL HYSTERECTOMY N/A    Phreesia 10/21/2019  . ANTERIOR AND POSTERIOR REPAIR  1990's  . APPENDECTOMY    . BREAST LUMPECTOMY  1998   left  . BREAST SURGERY N/A    Phreesia 10/21/2019  . CARPOMETACARPEL ([place]) FUSION OF THUMB WITH AUTOGRAFT FROM RADIUS  ~ 2009   "both thumbs" (02/20/2012)  . CATARACT  EXTRACTION W/ INTRAOCULAR LENS  IMPLANT, BILATERAL  2012  . CERVICAL DISCECTOMY  10/2001   C5-C6  . CERVICAL FUSION  2003   C3-C4  . CHOLECYSTECTOMY    . COLONOSCOPY    . DEBRIDEMENT TENNIS ELBOW  ?1970's   right  . ESOPHAGOGASTRODUODENOSCOPY    . EYE SURGERY N/A    Phreesia 10/21/2019  . HYSTERECTOMY    . KNEE ARTHROPLASTY  ?1990's   "?right; w/cartilage repair" (02/20/2012)  . NASAL SEPTUM SURGERY  1980's  . POSTERIOR CERVICAL FUSION/FORAMINOTOMY  2004   "failed initial fusion; rewired  anterior neck" (02/20/2012)  . REVERSE SHOULDER ARTHROPLASTY Right 02/16/2020   Procedure: REVERSE SHOULDER ARTHROPLASTY;  Surgeon: Marchia Bond, MD;  Location: [facility];  Service: Orthopedics;  Laterality: Right;  . SPINE SURGERY N/A    Phreesia 10/21/2019  . TONSILLECTOMY  ~ 1953  . VESICOVAGINAL FISTULA CLOSURE W/ TAH  1988  . VIDEO BRONCHOSCOPY Bilateral 08/23/2016   Procedure: VIDEO BRONCHOSCOPY WITH FLUORO;  Surgeon: Javier Glazier, MD;  Location: [facility];  Service: Cardiopulmonary;  Laterality: Bilateral;     reports that she is a non-smoker but has been exposed to tobacco smoke. She has never used smokeless tobacco. She reports previous alcohol use. She reports that she does not use drugs.  Allergies  Allergen Reactions  . Dust Mite Extract Shortness Of Breath and Other (See Comments)    "sneezing" (02/20/2012)  . Molds & Smuts Shortness Of Breath  . Morphine And Related Hives and Itching  . Other Shortness Of Breath and Other (See Comments)    Grass and weeds "sneezing; filled sinuses" (02/20/2012)  . Penicillins Rash and Other (See Comments)    "welts" (02/20/2012) Has patient had a PCN reaction causing immediate rash, facial/tongue/throat swelling, SOB or lightheadedness with hypotension: Unknown Has patient had a PCN reaction causing severe rash involving mucus membranes or skin necrosis: No Has patient had a PCN reaction that required hospitalization: No Has patient had a PCN  reaction occurring within the last 10 years: No If all of the above answers are "NO", then may proceed with Cephalosporin use. Tolerated Ancef 02/16/20.   Marland Kitchen Reclast [Zoledronic Acid] Other (See Comments)    Fever, Put in hospital, dr said it was a reaction from a reaction   . Rofecoxib Swelling    Vioxx REACTION: feet swelling  . Shellfish Allergy Anaphylaxis, Shortness Of Breath, Itching and Rash  . Tetracycline Hcl Nausea And Vomiting  . Xolair [Omalizumab] Other (See Comments)    Caused Blood clot  . Dilaudid [Hydromorphone Hcl] Itching  . Hydrocodone-Acetaminophen Nausea And Vomiting  . Levofloxacin Other (See Comments)    GI upset  . Oxycodone Hcl Nausea And Vomiting  . Paroxetine Nausea And Vomiting    Paxil   . Celecoxib Swelling    Feet swelling  . Diltiazem Swelling  . Lactose Intolerance (Gi) Other (See Comments)    Bloating and gas  . Tree  Extract Other (See Comments)    "tested and told I was allergic to it; never experienced a reaction to it" (02/20/2012)    Family History  Problem Relation Age of Onset  . Allergies Mother   . Heart disease Mother   . Arthritis Mother   . Lung cancer Mother   . Diabetes Mother   . Allergies Father   . Heart disease Father   . Arthritis Father   . Stroke Father   . Colon cancer Other        Maternal half aunt/Maternal half uncle  . Colitis Daughter   . Diabetes Maternal Grandfather      Prior to Admission medications   Medication Sig Start Date End Date Taking? Authorizing Provider  acetaminophen (TYLENOL) 650 MG CR tablet Take 1,300 mg by mouth every 8 (eight) hours as needed for pain.   Yes [provider]  albuterol (PROVENTIL) (2.5 MG/3ML) 0.083% nebulizer solution Take 3 mLs (2.5 mg total) by nebulization in the morning and at bedtime. Along with flutter valve 02/04/20  Yes Noemi Chapel P, DO  albuterol (PROAIR HFA) 108 (90 Base) MCG/ACT inhaler Inhale 2 puffs into the lungs every 4 (four) hours as needed  for wheezing or shortness of breath. 06/17/19   Julian Hy, DO  allopurinol (ZYLOPRIM) 100 MG tablet Take 100 mg by mouth daily. 11/19/19   [provider]  Alpha-D-Galactosidase (BEANO PO) Take 2-3 capsules by mouth 3 (three) times daily with meals.    [provider]  Alpha-Lipoic Acid 600 MG CAPS Take 600 mg by mouth daily.     [provider]  ammonium lactate (AMLACTIN) 12 % cream Apply topically as needed for dry skin. Patient taking differently: Apply 1 g topically at bedtime. 07/30/19   Trula Slade, DPM  atorvastatin (LIPITOR) 10 MG tablet TAKE 1 TABLET BY MOUTH DAILY Patient taking differently: Take 10 mg by mouth daily. 04/17/19   [name], MD  azelastine (ASTELIN) 0.1 % nasal spray USE 2 SPRAYS IN EACH NOSTRIL DAILY AS DIRECTED Patient taking differently: Place 2 sprays into both nostrils daily as needed for allergies. 01/02/19   Kozlow, Donnamarie Poag, MD  baclofen (LIORESAL) 10 MG tablet Take 1 tablet (10 mg total) by mouth 3 (three) times daily. As needed for muscle spasm 02/19/20   Merlene Pulling K, PA-C  CALCIUM-MAGNESIUM PO Take 1 tablet by mouth 2 (two) times daily.     [provider]  cetirizine (ZYRTEC) 10 MG tablet Take 10 mg by mouth daily.    [provider]  cholecalciferol (VITAMIN D) 25 MCG (1000 UT) tablet Take 1,000 Units by mouth daily.     [provider]  cyproheptadine (PERIACTIN) 4 MG tablet TAKE 2 TABLETS BY MOUTH EVERY EVENING Patient taking differently: Take 8 mg by mouth every evening. 02/02/20   [name], MD  denosumab (PROLIA) 60 MG/ML SOLN injection Inject 60 mg into the skin every 6 (six) months. Administer in upper arm, thigh, or abdomen    [provider]  DEXILANT 60 MG capsule TAKE 1 CAPSULE BY MOUTH EACH MORNING Patient taking differently: Take 60 mg by mouth daily. 02/02/20   [name], MD  diclofenac Sodium (VOLTAREN) 1 % GEL APPLY 2 GRAMS TOPICALLY TWICE  DAILY Patient taking differently: Apply 1 application topically 4 (four) times daily. 09/10/19   [name], MD  DULERA 200-5 MCG/ACT AERO INHALE 2 PUFFS BY MOUTH TWICE DAILY Patient taking differently: Inhale  2 puffs into the lungs 2 (two) times daily. 02/03/20   [name], MD  EPINEPHrine (EPIPEN 2-PAK) 0.3 mg/0.3 mL IJ SOAJ injection Inject 0.3 mLs (0.3 mg total) into the muscle Once PRN. Patient taking differently: Inject 0.3 mg into the muscle as needed for anaphylaxis. 01/23/19   [name], MD  famotidine (PEPCID) 20 MG tablet TAKE 1 TABLET BY MOUTH TWICE A DAY Patient taking differently: Take 20 mg by mouth 2 (two) times daily. 02/01/20   Kozlow, Donnamarie Poag, MD  fluconazole (DIFLUCAN) 100 MG tablet Take 1 tablet (100 mg total) by mouth daily for 7 days. 02/19/20 02/26/20  Merlene Pulling K, PA-C  fluticasone (FLONASE) 50 MCG/ACT nasal spray Place 2 sprays into both nostrils daily. 04/01/19   Julian Hy, DO  folic acid (FOLVITE) 1 MG tablet Take 1 mg by mouth daily.    [provider]  furosemide (LASIX) 40 MG tablet TAKE 1 TABLET BY MOUTH DAILY AS NEEDED Patient taking differently: Take 20-40 mg by mouth See admin instructions. Take 40 mg in the morning and 20 mg in the evening 11/05/19   [name], MD  gabapentin (NEURONTIN) 600 MG tablet TAKE 1 TABLET BY MOUTH DAILY Patient taking differently: Take 600 mg by mouth daily. 02/03/20   [name], MD  guaiFENesin (MUCINEX) 600 MG 12 hr tablet Take 600 mg by mouth 2 (two) times daily.    [provider]  hydrocortisone cream 0.5 % APPLY TO THE AFFECTED AREA(S) AS DIRECTED Patient not taking: No sig reported 02/02/20   [name], MD  Lactase 9000 units TABS Take 18,000-27,000 Units by mouth 4 (four) times daily as needed (dairy consumption).    [provider]  leflunomide (ARAVA) 20 MG tablet Take 20 mg by mouth daily. 12/14/19   [provider]  lidocaine (LIDODERM) 5  % Place 1 patch onto the skin daily as needed (pain). Remove & Discard patch within 12 hours or as directed by MD 01/23/18   Aline August, MD  magnesium oxide (MAG-OX) 400 MG tablet Take 400 mg by mouth at bedtime.    [provider]  mirtazapine (REMERON SOL-TAB) 30 MG disintegrating tablet DISSOLVE 1 TABLET BY MOUTH EVERY NIGHT AT BEDTIME Patient taking differently: Take 30 mg by mouth at bedtime. 02/02/20   [name], MD  MITIGARE 0.6 MG CAPS Take 0.6 mg by mouth every other day. 01/16/20   [provider]  montelukast (SINGULAIR) 10 MG tablet TAKE 1 TABLET BY MOUTH DAILY Patient taking differently: Take 10 mg by mouth every evening. 02/01/20   Kozlow, Donnamarie Poag, MD  mupirocin ointment (BACTROBAN) 2 % Apply 1 application topically 2 (two) times daily. Patient taking differently: Apply 1 application topically at bedtime. 10/05/19   Trula Slade, DPM  Nystatin (GERHARDT'S BUTT CREAM) CREA Apply 1 application topically daily as needed (hemorrhoids). NYSTATIN 100,000 UNIT/GM CR 100000 CRM- 60 g, ZINC OXIDE 20 % OINT 20 OINT- 60 g, HYDROCORTISONE 1 % CREAM- 60 g 02/15/20   [name], MD  nystatin (MYCOSTATIN) 100000 UNIT/ML suspension SWISH AND SPIT 5 MLS TWICE DAILY Patient taking differently: Use as directed 5 mLs in the mouth or throat at bedtime. 08/27/19   Kozlow, Donnamarie Poag, MD  nystatin-triamcinolone ointment [facility]) Apply topically 2 (two) times daily. Patient taking differently: Apply 1 application topically 2 (two) times daily as needed (yeast infections). 07/07/19   [name], MD  ondansetron (ZOFRAN) 4 MG tablet  Take 1 tablet (4 mg total) by mouth every 8 (eight) hours as needed for nausea or vomiting. 02/19/20   Merlene Pulling K, PA-C  predniSONE (DELTASONE) 5 MG tablet Take 15 mg by mouth daily with breakfast.    [provider]  Prenatal Vit-Fe Fumarate-FA (PRENATAL MULTIVITAMIN) TABS tablet Take 1 tablet by mouth daily.    [provider]  Probiotic Product (ALIGN PO) Take 1 capsule by mouth daily.    [provider]  Propylene Glycol (SYSTANE COMPLETE) 0.6 % SOLN Place 1 drop into both eyes 2 (two) times daily.    [provider]  Respiratory Therapy Supplies (FLUTTER) DEVI Use as directed 10/22/14   Elsie Stain, MD  Salicylic Acid-Urea [facility] EX) Apply 1 application topically at bedtime.    [provider]  sodium chloride HYPERTONIC 3 % nebulizer solution Take by nebulization 2 (two) times daily. Patient taking differently: Take 4 mLs by nebulization 2 (two) times daily. 02/02/20   Julian Hy, DO  Spacer/Aero-Holding Chambers (AEROCHAMBER PLUS) inhaler Use as instructed 07/29/18   Parrett, Fonnie Mu, NP  Tiotropium Bromide Monohydrate (SPIRIVA RESPIMAT) 2.5 MCG/ACT AERS Inhale 1 puff into the lungs daily. 05/01/19   Julian Hy, DO  traMADol (ULTRAM) 50 MG tablet Take 1 tablet (50 mg total) by mouth every 6 (six) hours as needed. 02/19/20   Merlene Pulling K, PA-C  XARELTO 20 MG TABS tablet TAKE 1 TABLET BY MOUTH DAILY WITH SUPPER Patient taking differently: Take 20 mg by mouth every evening. 02/02/20   [name], MD    Physical Exam: Vitals:   02/23/20 1127 02/23/20 1200 02/23/20 1300 02/23/20 1400  BP: (!) 119/58 118/73 139/74 118/64  Pulse: 99 97 (!) 101 (!) 101  Resp: 20 19 18  (!) 23  Temp: (!) 97.5 F (36.4 C)     TempSrc: Tympanic     SpO2: 99% 99% 100% 99%    Constitutional: NAD, calm, comfortable Vitals:   02/23/20 1127 02/23/20 1200 02/23/20 1300 02/23/20 1400  BP: (!) 119/58 118/73 139/74 118/64  Pulse: 99 97 (!) 101 (!) 101  Resp: 20 19 18  (!) 23  Temp: (!) 97.5 F (36.4 C)     TempSrc: Tympanic     SpO2: 99% 99% 100% 99%   Eyes: PERRL, lids and conjunctivae normal ENMT: Mucous membranes are dry. Posterior pharynx clear of any exudate or lesions.Normal dentition.  Neck: normal, supple, no masses, no thyromegaly Respiratory: clear to  auscultation bilaterally, no wheezing, no crackles. Normal respiratory effort. No accessory muscle use.  Cardiovascular: Regular rate and rhythm, no murmurs / rubs / gallops. No extremity edema. 2+ pedal pulses. No carotid bruits.  Abdomen: no tenderness, no masses palpated. No hepatosplenomegaly. Bowel sounds positive.  Musculoskeletal: no clubbing / cyanosis. No joint deformity upper and lower extremities. Good ROM, no contractures. Normal muscle tone.  Skin: no rashes, lesions, ulcers. No induration Neurologic: Opens eyes, very sleepy, no facial droop, moving all limbs  psychiatric: Arousable, confused    Labs on Admission: I have personally reviewed following labs and imaging studies  CBC: Recent Labs  Lab 02/17/20 0252 02/18/20 0320 02/19/20 0817 02/23/20 0944 02/23/20 1018  WBC 13.7* 11.2* 11.5* 12.5*  --   HGB 10.6* 10.0* 9.8* 10.6* 11.2*  HCT 34.1* 31.7* 30.5* 32.6* 33.0*  MCV 109.6* 108.9* 106.6* 107.6*  --   PLT 159 142* 143* 198  --    Basic Metabolic Panel: Recent Labs  Lab 02/17/20 0252 02/18/20  0700 02/19/20 0817 02/19/20 0844 02/23/20 0944 02/23/20 1018  NA 140 138 139  --  138 136  K 3.9 2.8* 3.6  --  4.0 3.4*  CL 105 100 103  --  96* 101  CO2 26 25 25   --  24  --   GLUCOSE 168* 182* 173*  --  187* 174*  BUN 24* 29* 31*  --  48* 44*  CREATININE 1.39* 1.71* 1.26*  --  1.52* 1.40*  CALCIUM 8.3* 7.8* 8.1*  --  9.3  --   MG  --  1.9  --  1.8  --   --    GFR: Estimated Creatinine Clearance: 32.4 mL/min (A) (by C-G formula based on SCr of 1.4 mg/dL (H)). Liver Function Tests: Recent Labs  Lab 02/19/20 0817 02/23/20 0944  AST 56* 67*  ALT 86* 115*  ALKPHOS 39 71  BILITOT 1.0 1.5*  PROT 5.6* 6.1*  ALBUMIN 3.1* 2.9*   No results for input(s): LIPASE, AMYLASE in the last 168 hours. Recent Labs  Lab 02/23/20 1242  AMMONIA 19   Coagulation Profile: Recent Labs  Lab 02/23/20 0944  INR 2.1*   Cardiac Enzymes: Recent Labs  Lab 02/23/20 1234   CKTOTAL 468*   BNP (last 3 results) No results for input(s): PROBNP in the last 8760 hours. HbA1C: No results for input(s): HGBA1C in the last 72 hours. CBG: No results for input(s): GLUCAP in the last 168 hours. Lipid Profile: No results for input(s): CHOL, HDL, LDLCALC, TRIG, CHOLHDL, LDLDIRECT in the last 72 hours. Thyroid Function Tests: No results for input(s): TSH, T4TOTAL, FREET4, T3FREE, THYROIDAB in the last 72 hours. Anemia Panel: No results for input(s): VITAMINB12, FOLATE, FERRITIN, TIBC, IRON, RETICCTPCT in the last 72 hours. Urine analysis:    Component Value Date/Time   COLORURINE YELLOW 02/23/2020 1300   APPEARANCEUR CLEAR 02/23/2020 1300   LABSPEC 1.014 02/23/2020 1300   PHURINE 6.0 02/23/2020 1300   GLUCOSEU NEGATIVE 02/23/2020 1300   HGBUR NEGATIVE 02/23/2020 1300   BILIRUBINUR NEGATIVE 02/23/2020 1300   KETONESUR NEGATIVE 02/23/2020 1300   PROTEINUR NEGATIVE 02/23/2020 1300   UROBILINOGEN 0.2 05/17/2014 1113   NITRITE NEGATIVE 02/23/2020 1300   LEUKOCYTESUR NEGATIVE 02/23/2020 1300    Radiological Exams on Admission: CT HEAD WO CONTRAST  Result Date: 02/23/2020 CLINICAL DATA:  Trauma.  Fall. EXAM: CT HEAD WITHOUT CONTRAST CT CERVICAL SPINE WITHOUT CONTRAST TECHNIQUE: Multidetector CT imaging of the head and cervical spine was performed following the standard protocol without intravenous contrast. Multiplanar CT image reconstructions of the cervical spine were also generated. COMPARISON:  Radiographs October 09, 2013. CT head March 06, 2018. FINDINGS: CT HEAD FINDINGS Brain: No evidence of acute large vascular territory infarction, hemorrhage, hydrocephalus, extra-axial collection or mass lesion/mass effect. Patchy white matter hypoattenuation, most likely related to chronic microvascular ischemic disease. Mild generalized cerebral volume loss with ex vacuo ventricular dilation. Vascular: Calcific atherosclerosis. Skull: No acute fracture. Sinuses/Orbits:  Ethmoid air cell mucosal thickening without air-fluid levels. Unremarkable orbits. Other: No mastoid effusions. CT CERVICAL SPINE FINDINGS Nondiagnostic evaluation at the craniocervical junction secondary to motion. Within this limitation: Alignment: There is approximately 5 mm of anterolisthesis of C4 on C5, favored chronic/degenerative given anterolisthesis on prior radiographs from October 09, 2013. Otherwise, no substantial sagittal subluxation. Skull base and vertebrae: Osseous fusion of the C6-C7 vertebral bodies. ACDF spanning C3-C4. No evidence of acute fracture. Vertebral body heights are maintained. Post cerclage wires at C6-C7. Soft tissues and spinal canal: No visible prevertebral  fluid or swelling. No visible canal hematoma, although evaluation of the canal is limited by motion. Disc levels: Multilevel facet hypertrophy. Multilevel mild degenerative disc disease. Upper chest: Negative. IMPRESSION: 1. No evidence of acute intracranial abnormality. 2. No evidence of acute cervical spine fracture, although evaluation at the craniocervical junction is nondiagnostic secondary to motion. 3. Approximately 5 mm of anterolisthesis of C4 on C5, favored chronic/degenerative given anterolisthesis on prior radiographs from October 09, 2013. Electronically Signed   By: Margaretha Sheffield MD   On: 02/23/2020 10:57   CT CERVICAL SPINE WO CONTRAST  Result Date: 02/23/2020 CLINICAL DATA:  Trauma.  Fall. EXAM: CT HEAD WITHOUT CONTRAST CT CERVICAL SPINE WITHOUT CONTRAST TECHNIQUE: Multidetector CT imaging of the head and cervical spine was performed following the standard protocol without intravenous contrast. Multiplanar CT image reconstructions of the cervical spine were also generated. COMPARISON:  Radiographs October 09, 2013. CT head March 06, 2018. FINDINGS: CT HEAD FINDINGS Brain: No evidence of acute large vascular territory infarction, hemorrhage, hydrocephalus, extra-axial collection or mass lesion/mass effect.  Patchy white matter hypoattenuation, most likely related to chronic microvascular ischemic disease. Mild generalized cerebral volume loss with ex vacuo ventricular dilation. Vascular: Calcific atherosclerosis. Skull: No acute fracture. Sinuses/Orbits: Ethmoid air cell mucosal thickening without air-fluid levels. Unremarkable orbits. Other: No mastoid effusions. CT CERVICAL SPINE FINDINGS Nondiagnostic evaluation at the craniocervical junction secondary to motion. Within this limitation: Alignment: There is approximately 5 mm of anterolisthesis of C4 on C5, favored chronic/degenerative given anterolisthesis on prior radiographs from October 09, 2013. Otherwise, no substantial sagittal subluxation. Skull base and vertebrae: Osseous fusion of the C6-C7 vertebral bodies. ACDF spanning C3-C4. No evidence of acute fracture. Vertebral body heights are maintained. Post cerclage wires at C6-C7. Soft tissues and spinal canal: No visible prevertebral fluid or swelling. No visible canal hematoma, although evaluation of the canal is limited by motion. Disc levels: Multilevel facet hypertrophy. Multilevel mild degenerative disc disease. Upper chest: Negative. IMPRESSION: 1. No evidence of acute intracranial abnormality. 2. No evidence of acute cervical spine fracture, although evaluation at the craniocervical junction is nondiagnostic secondary to motion. 3. Approximately 5 mm of anterolisthesis of C4 on C5, favored chronic/degenerative given anterolisthesis on prior radiographs from October 09, 2013. Electronically Signed   By: Margaretha Sheffield MD   On: 02/23/2020 10:57   DG Pelvis Portable  Result Date: 02/23/2020 CLINICAL DATA:  Unwitnessed fall EXAM: PORTABLE PELVIS 1-2 VIEWS COMPARISON:  None. FINDINGS: There is no evidence of pelvic fracture or dislocation. Mild symmetric narrowing of each hip joint noted. Degenerative change noted in visualized lower lumbar spine. No erosive change. IMPRESSION: Mild narrowing each hip  joint.  No fracture or dislocation. Electronically Signed   By: Lowella Grip III M.D.   On: 02/23/2020 10:21   DG Chest Port 1 View  Result Date: 02/23/2020 CLINICAL DATA:  Trauma.  Found on floor. EXAM: PORTABLE CHEST 1 VIEW COMPARISON:  02/18/2020 FINDINGS: Cardiac and mediastinal contours normal. Vascularity normal. Mild atelectasis in the bases. No effusion. Apical pleural thickening bilaterally. Right shoulder replacement.  Prior cervical spine for surgery. IMPRESSION: Mild bibasilar atelectasis. Electronically Signed   By: Franchot Gallo M.D.   On: 02/23/2020 10:21   DG Shoulder Right Port  Result Date: 02/23/2020 CLINICAL DATA:  Pain following fall EXAM: PORTABLE RIGHT SHOULDER: 2 + V COMPARISON:  February 16, 2020. FINDINGS: Oblique, frontal, and Y scapular images were obtained. 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 No acute fracture or  evident dislocation. Extensive bursal calcification laterally appear stable. No erosion. Visualized right lung clear. IMPRESSION: Total shoulder replacement with prosthetic components appearing well-seated. No acute fracture or dislocation. Extensive bursal calcification is stable. Electronically Signed   By: Lowella Grip III M.D.   On: 02/23/2020 10:24    EKG: Independently reviewed. Sinus, no QTC or PR issues  Assessment/Plan Active Problems:   AMS (altered mental status)  (please populate well all problems here in Problem List. (For example, if patient is on BP meds at home and you resume or decide to hold them, it is a problem that needs to be her. Same for CAD, COPD, HLD and so on)  Acute metabolic encephalopathy -Discussed with daughter at bedside, suspect patient's mentation changes happened overnight attributed to combined effect of tramadol and unable to use her CPAP at night. D/W lab, they are not sure whether tramadol supposed to show up as "Opiod" in UDS. -Stop Tramadol, resume HS  CPAP -It appeared that pt had urine incontinence when family found her. Neurology consulted. Given Hx, siezure unlikely. -PT evaluation  Lactic acidoses -Severe dehydration, IVF -Less likely active infection going on, will not continue ABX.  Severe tracheobronchial malacia -HS CPAP  Chronic diastolic CHF -Euvolemic, BP fairly controlled -Continue CHF medication, but will hold Lasix for dehydration  CKD stage III -Hypovolemic, IVF  Oral thrush -Started fluconazole on 12/31, aiming at total 7 days  Asthma COPD -Check ABG for evidence of CO2 retention, otherwise no symptoms signs of COPD exacerbation -Continue home breathing meds  Chronic transaminitis -Status post cholecystectomy, recommend outpatient GI follow-up -Ammonia level normal -Probably safe to continue statin.  Rheumatoid arthritis -Continue disease modification meds  PAF -Continue Xarelto  Chronic adrenal insufficiency -Secondary to chronic steroid use -We will double dose her prednisone until her mentation stabilized.  Right shoulder surgery -Avoid narcotics -PT evaluation  DVT prophylaxis: Continue Xarelto Code Status: Full code Family Communication: Daughter at bedside Disposition Plan: Telemetry observation  consults called: Neurology Admission status: Telemetry observation   Lequita Halt MD [facility] Pager [phone]  02/23/2020, 3:07 PM

## 2020-02-24 ENCOUNTER — Observation Stay (HOSPITAL_COMMUNITY): Payer: Medicare PPO

## 2020-02-24 DIAGNOSIS — G9340 Encephalopathy, unspecified: Secondary | ICD-10-CM | POA: Insufficient documentation

## 2020-02-24 DIAGNOSIS — E876 Hypokalemia: Secondary | ICD-10-CM | POA: Diagnosis present

## 2020-02-24 DIAGNOSIS — J9811 Atelectasis: Secondary | ICD-10-CM | POA: Diagnosis not present

## 2020-02-24 DIAGNOSIS — G9341 Metabolic encephalopathy: Secondary | ICD-10-CM | POA: Diagnosis present

## 2020-02-24 DIAGNOSIS — R4182 Altered mental status, unspecified: Secondary | ICD-10-CM | POA: Diagnosis not present

## 2020-02-24 DIAGNOSIS — Z853 Personal history of malignant neoplasm of breast: Secondary | ICD-10-CM | POA: Diagnosis not present

## 2020-02-24 DIAGNOSIS — M810 Age-related osteoporosis without current pathological fracture: Secondary | ICD-10-CM | POA: Diagnosis present

## 2020-02-24 DIAGNOSIS — E870 Hyperosmolality and hypernatremia: Secondary | ICD-10-CM | POA: Diagnosis present

## 2020-02-24 DIAGNOSIS — J4520 Mild intermittent asthma, uncomplicated: Secondary | ICD-10-CM | POA: Diagnosis present

## 2020-02-24 DIAGNOSIS — Z20822 Contact with and (suspected) exposure to covid-19: Secondary | ICD-10-CM | POA: Diagnosis present

## 2020-02-24 DIAGNOSIS — R000 Tachycardia, unspecified: Secondary | ICD-10-CM | POA: Diagnosis present

## 2020-02-24 DIAGNOSIS — K219 Gastro-esophageal reflux disease without esophagitis: Secondary | ICD-10-CM | POA: Diagnosis present

## 2020-02-24 DIAGNOSIS — E782 Mixed hyperlipidemia: Secondary | ICD-10-CM | POA: Diagnosis not present

## 2020-02-24 DIAGNOSIS — D72829 Elevated white blood cell count, unspecified: Secondary | ICD-10-CM | POA: Diagnosis present

## 2020-02-24 DIAGNOSIS — E785 Hyperlipidemia, unspecified: Secondary | ICD-10-CM | POA: Diagnosis present

## 2020-02-24 DIAGNOSIS — I5032 Chronic diastolic (congestive) heart failure: Secondary | ICD-10-CM | POA: Diagnosis present

## 2020-02-24 DIAGNOSIS — M797 Fibromyalgia: Secondary | ICD-10-CM | POA: Diagnosis present

## 2020-02-24 DIAGNOSIS — G928 Other toxic encephalopathy: Secondary | ICD-10-CM | POA: Diagnosis present

## 2020-02-24 DIAGNOSIS — E2749 Other adrenocortical insufficiency: Secondary | ICD-10-CM | POA: Diagnosis present

## 2020-02-24 DIAGNOSIS — B370 Candidal stomatitis: Secondary | ICD-10-CM | POA: Diagnosis present

## 2020-02-24 DIAGNOSIS — M069 Rheumatoid arthritis, unspecified: Secondary | ICD-10-CM | POA: Diagnosis present

## 2020-02-24 DIAGNOSIS — T40425A Adverse effect of tramadol, initial encounter: Secondary | ICD-10-CM | POA: Diagnosis present

## 2020-02-24 DIAGNOSIS — M542 Cervicalgia: Secondary | ICD-10-CM

## 2020-02-24 DIAGNOSIS — J398 Other specified diseases of upper respiratory tract: Secondary | ICD-10-CM | POA: Diagnosis present

## 2020-02-24 DIAGNOSIS — R7401 Elevation of levels of liver transaminase levels: Secondary | ICD-10-CM | POA: Diagnosis present

## 2020-02-24 DIAGNOSIS — E872 Acidosis: Secondary | ICD-10-CM | POA: Diagnosis present

## 2020-02-24 DIAGNOSIS — Z86718 Personal history of other venous thrombosis and embolism: Secondary | ICD-10-CM | POA: Diagnosis not present

## 2020-02-24 DIAGNOSIS — N1831 Chronic kidney disease, stage 3a: Secondary | ICD-10-CM | POA: Diagnosis present

## 2020-02-24 LAB — COMPREHENSIVE METABOLIC PANEL
ALT: 89 U/L — ABNORMAL HIGH (ref 0–44)
AST: 63 U/L — ABNORMAL HIGH (ref 15–41)
Albumin: 2.4 g/dL — ABNORMAL LOW (ref 3.5–5.0)
Alkaline Phosphatase: 67 U/L (ref 38–126)
Anion gap: 16 — ABNORMAL HIGH (ref 5–15)
BUN: 29 mg/dL — ABNORMAL HIGH (ref 8–23)
CO2: 19 mmol/L — ABNORMAL LOW (ref 22–32)
Calcium: 8.6 mg/dL — ABNORMAL LOW (ref 8.9–10.3)
Chloride: 106 mmol/L (ref 98–111)
Creatinine, Ser: 1.25 mg/dL — ABNORMAL HIGH (ref 0.44–1.00)
GFR, Estimated: 46 mL/min — ABNORMAL LOW (ref >60)
Glucose, Bld: 138 mg/dL — ABNORMAL HIGH (ref 70–99)
Potassium: 3.9 mmol/L (ref 3.5–5.1)
Sodium: 141 mmol/L (ref 135–145)
Total Bilirubin: 1.9 mg/dL — ABNORMAL HIGH (ref 0.3–1.2)
Total Protein: 5.4 g/dL — ABNORMAL LOW (ref 6.5–8.1)

## 2020-02-24 LAB — CBC WITH DIFFERENTIAL/PLATELET
Abs Immature Granulocytes: 0.1 10*3/uL — ABNORMAL HIGH (ref 0.00–0.07)
Basophils Absolute: 0 10*3/uL (ref 0.0–0.1)
Basophils Relative: 0 %
Eosinophils Absolute: 0.1 10*3/uL (ref 0.0–0.5)
Eosinophils Relative: 1 %
HCT: 28.7 % — ABNORMAL LOW (ref 36.0–46.0)
Hemoglobin: 9.4 g/dL — ABNORMAL LOW (ref 12.0–15.0)
Immature Granulocytes: 1 %
Lymphocytes Relative: 7 %
Lymphs Abs: 0.7 10*3/uL (ref 0.7–4.0)
MCH: 35.3 pg — ABNORMAL HIGH (ref 26.0–34.0)
MCHC: 32.8 g/dL (ref 30.0–36.0)
MCV: 107.9 fL — ABNORMAL HIGH (ref 80.0–100.0)
Monocytes Absolute: 1 10*3/uL (ref 0.1–1.0)
Monocytes Relative: 10 %
Neutro Abs: 7.8 10*3/uL — ABNORMAL HIGH (ref 1.7–7.7)
Neutrophils Relative %: 81 %
Platelets: 182 10*3/uL (ref 150–400)
RBC: 2.66 MIL/uL — ABNORMAL LOW (ref 3.87–5.11)
RDW: 20.9 % — ABNORMAL HIGH (ref 11.5–15.5)
WBC: 9.7 10*3/uL (ref 4.0–10.5)
nRBC: 0.6 % — ABNORMAL HIGH (ref 0.0–0.2)

## 2020-02-24 LAB — CBC
HCT: 35.5 % — ABNORMAL LOW (ref 36.0–46.0)
Hemoglobin: 11.2 g/dL — ABNORMAL LOW (ref 12.0–15.0)
MCH: 35 pg — ABNORMAL HIGH (ref 26.0–34.0)
MCHC: 31.5 g/dL (ref 30.0–36.0)
MCV: 110.9 fL — ABNORMAL HIGH (ref 80.0–100.0)
Platelets: 170 10*3/uL (ref 150–400)
RBC: 3.2 MIL/uL — ABNORMAL LOW (ref 3.87–5.11)
RDW: 20.5 % — ABNORMAL HIGH (ref 11.5–15.5)
WBC: 10.4 10*3/uL (ref 4.0–10.5)
nRBC: 0.4 % — ABNORMAL HIGH (ref 0.0–0.2)

## 2020-02-24 LAB — APTT: aPTT: 31 seconds (ref 24–36)

## 2020-02-24 LAB — PROTIME-INR
INR: 2.7 — ABNORMAL HIGH (ref 0.8–1.2)
Prothrombin Time: 27.8 seconds — ABNORMAL HIGH (ref 11.4–15.2)

## 2020-02-24 LAB — MRSA PCR SCREENING: MRSA by PCR: NEGATIVE

## 2020-02-24 LAB — LACTIC ACID, PLASMA
Lactic Acid, Venous: 1.9 mmol/L (ref 0.5–1.9)
Lactic Acid, Venous: 1.3 mmol/L (ref 0.5–1.9)

## 2020-02-24 MED ORDER — VANCOMYCIN HCL IN DEXTROSE 1-5 GM/200ML-% IV SOLN: 1000.0000 mg | INTRAVENOUS | Status: DC

## 2020-02-24 MED ORDER — DEXTROSE-NACL 5-0.9 % IV SOLN: INTRAVENOUS | Status: DC

## 2020-02-24 MED ORDER — SODIUM CHLORIDE 0.9 % IV SOLN
2.0000 g | Freq: Two times a day (BID) | INTRAVENOUS | Status: DC
  Administered 2020-02-24 (×2): 2 g via INTRAVENOUS
  Filled 2020-02-24 (×2): qty 2

## 2020-02-24 NOTE — ED Notes (Signed)
Pt cleaned of bowel incontinence and new brief applied.

## 2020-02-24 NOTE — Procedures (Signed)
Patient Name: Ebony Scott  MRN: [id number]  Epilepsy Attending: Charlsie Quest  Referring Physician/Provider: Dietrich Pates, PA Date: 02/23/2019 Duration: 23.43 minutes  Patient history: 70 year old female with altered mental status. EEG to evaluate for seizures.  Level of alertness: Awake  AEDs during EEG study: Gabapentin  Technical aspects: This EEG study was done with scalp electrodes positioned according to the 10-20 International system of electrode placement. Electrical activity was acquired at a sampling rate of 500Hz  and reviewed with a high frequency filter of 70Hz  and a low frequency filter of 1Hz . EEG data were recorded continuously and digitally stored.   Description: No clear posterior dominant rhythm was seen. EEG showed continuous generalized 3 to 5 Hz theta-delta slowing. Triphasic waves, generalized, maximal bifrontal were noted. Hyperventilation and photic stimulation were not performed.     ABNORMALITY -Continuous slow, generalized -Triphasic waves, generalized  IMPRESSION: This study is suggestive of moderate diffuse encephalopathy, nonspecific etiology but could be secondary to toxic, metabolic causes. No seizures or definite epileptiform discharges were seen throughout the recording.  [name] 

## 2020-02-24 NOTE — Progress Notes (Signed)
Patient not placed on CPAP at this time.  Patient will not tolerate. 

## 2020-02-24 NOTE — Evaluation (Signed)
Physical Therapy Evaluation Patient Details Name: Ebony Scott MRN: [id number] DOB: 1950-06-21 Today's Date: 02/24/2020   History of Present Illness  70yo female s/p R shoulder arthroplasty 02/16/20, now presenting with AMS. Last seen normal 1/3, found on the morning of 1/4 by her daughter and was unresponsive. CTH negative for acute changes, imaging otherwise clear of acute injury. Admitted with acute metabolic encepthalopathy. PMH hx DVT, severe tracheobronchial malacia, CHF, CKD, osteoporosis, RA, breast CA in remission, fibromyalgia, PVD, cervical surgery  Clinical Impression   Patient received in bed, going back and forth between lethargic state and moaning/groaning, and short bouts where she became a bit more alert and was able to answer appropriately (for example, said "thank you" when PT said "I hope you feel better". No sling present, daughter reports it was lost/damaged when EMS came to pick up patient so provided manual support and cues to protect shoulder/maintain precautions during session. Became very restless after we initiated mobility. Needed heavy levels of physical assist for safety and balance today. Left in bed with all needs met, bed alarm active and in chair position, and daughter present. Discussed case with RN and attending MD (Dr. Louanne Belton), asked MD to kindly order a sling for her R shoulder. At this point will need SNF unless cognition clears considerably.     Follow Up Recommendations SNF;Supervision/Assistance - 24 hour    Equipment Recommendations  Wheelchair (measurements PT);Wheelchair cushion (measurements PT);3in1 (PT)    Recommendations for Other Services       Precautions / Restrictions Precautions Precautions: Shoulder Type of Shoulder Precautions: No AROM, NO PROM Shoulder Interventions: Shoulder sling/immobilizer;At all times;Off for dressing/bathing/exercises Precaution Booklet Issued: No Required Braces or Orthoses: Sling Restrictions Weight Bearing  Restrictions: Yes RUE Weight Bearing: Non weight bearing Other Position/Activity Restrictions: no sling present at PT eval 1/5- discussed with RN and messaged MD asking for new sling order      Mobility  Bed Mobility Overal bed mobility: Needs Assistance Bed Mobility: Rolling;Supine to Sit;Sit to Supine Rolling: Min assist   Supine to sit: Max assist Sit to supine: Mod assist   General bed mobility comments: Min assist but mod facilitation to keep R UE still and maintain precautions/avoid WB; MaxA to come up to sitting from flat bed, spontaneously laid back down after 2-3 minutes and needed ModA to protect R UE/control descent    Transfers                 General transfer comment: deferred- safety  Ambulation/Gait             General Gait Details: deferred- safety  Stairs            Wheelchair Mobility    Modified Rankin (Stroke Patients Only)       Balance Overall balance assessment: Needs assistance;History of Falls Sitting-balance support: Single extremity supported;Feet unsupported Sitting balance-Leahy Scale: Poor Sitting balance - Comments: Min-ModA to maintain upright, multidirectional balance loss and restless       Standing balance comment: unable with +1 assist                             Pertinent Vitals/Pain Pain Assessment: Faces Faces Pain Scale: Hurts a little bit Pain Location: generalized discomfort Pain Descriptors / Indicators: Discomfort;Aching;Sore Pain Intervention(s): Limited activity within patient's tolerance;Monitored during session;Repositioned    Home Living Family/patient expects to be discharged to:: Private residence Living Arrangements: Alone Available Help at Discharge: Family;Available  PRN/intermittently Type of Home: House Home Access: Stairs to enter Entrance Stairs-Rails: Left Entrance Stairs-Number of Steps: family does not let her use baseline steps- stays on main floor once inside Home  Layout: Two level;Other (Comment) (basement down to washer and dryer) Home Equipment: Toilet riser;Shower seat;Cane - single point Additional Comments: daughter works as a Nurse, mental health at [employer] a couple days a week but lives in [place]; sounds like son is local but could not help 24/7 potentially    Prior Function Level of Independence: Independent               Hand Dominance   Dominant Hand: Right    Extremity/Trunk Assessment        Lower Extremity Assessment Lower Extremity Assessment: Generalized weakness    Cervical / Trunk Assessment Cervical / Trunk Assessment: Kyphotic  Communication   Communication: No difficulties  Cognition Arousal/Alertness: Lethargic Behavior During Therapy: Restless;Impulsive;Flat affect Overall Cognitive Status: Impaired/Different from baseline Area of Impairment: Orientation;Attention;Memory;Following commands;Safety/judgement;Awareness;Problem solving                 Orientation Level: Disoriented to;Situation;Time (able to tell me she is in a hospital but not which one) Current Attention Level: Focused Memory: Decreased recall of precautions;Decreased short-term memory Following Commands: Follows one step commands inconsistently;Follows one step commands with increased time Safety/Judgement: Decreased awareness of safety;Decreased awareness of deficits Awareness: Intellectual Problem Solving: Slow processing;Decreased initiation;Difficulty sequencing;Requires verbal cues;Requires tactile cues General Comments: alternated between being lethargic and short periods where she would respond appropriately. More lethargic than interactive. Max cues for safety and precautions with R shoulder. Very restless and continuously trying to take off hospital gown, spontaneously moving R shoulder despite cues and needed tactile cues/physical support to RUE to maintain R shoulder precautions      General Comments      Exercises      Assessment/Plan    PT Assessment Patient needs continued PT services  PT Problem List Decreased balance;Decreased knowledge of use of DME;Decreased mobility;Decreased strength;Decreased activity tolerance;Decreased cognition;Decreased safety awareness;Decreased knowledge of precautions;Decreased coordination       PT Treatment Interventions DME instruction;Gait training;Balance training;Therapeutic exercise;Functional mobility training;Therapeutic activities;Stair training;Patient/family education    PT Goals (Current goals can be found in the Care Plan section)  Acute Rehab PT Goals Patient Stated Goal: be able to go home if safe PT Goal Formulation: With family Time For Goal Achievement: 03/09/20 Potential to Achieve Goals: Fair    Frequency Min 3X/week   Barriers to discharge        Co-evaluation               AM-PAC PT "6 Clicks" Mobility  Outcome Measure Help needed turning from your back to your side while in a flat bed without using bedrails?: A Little Help needed moving from lying on your back to sitting on the side of a flat bed without using bedrails?: A Lot Help needed moving to and from a bed to a chair (including a wheelchair)?: A Lot Help needed standing up from a chair using your arms (e.g., wheelchair or bedside chair)?: A Lot Help needed to walk in hospital room?: Total Help needed climbing 3-5 steps with a railing? : Total 6 Click Score: 11    End of Session   Activity Tolerance: Patient limited by lethargy;Other (comment) (and restlessness/limited command following) Patient left: in bed;with call bell/phone within reach;with bed alarm set;with family/visitor present;Other (comment) (bed in chair position, respiratory therapist and daughter present) Nurse Communication:  Mobility status;Other (comment);Weight bearing status (need sling for R UE, R shoulder arthorplasty recently/precautions) PT Visit Diagnosis: Unsteadiness on feet (R26.81);Difficulty  in walking, not elsewhere classified (R26.2);History of falling (Z91.81);Muscle weakness (generalized) (M62.81);Other abnormalities of gait and mobility (R26.89)    Time: 0148-4039 PT Time Calculation (min) (ACUTE ONLY): 21 min   Charges:   PT Evaluation $PT Eval High Complexity: 1 High          Windell Norfolk, DPT, PN1   Supplemental Physical Therapist [place]    Pager [phone] Acute Rehab Office [phone]

## 2020-02-24 NOTE — ED Notes (Signed)
Pt cleaned of urinary and bowel incontinence. Linens changed. New brief applied.

## 2020-02-24 NOTE — ED Notes (Signed)
Pt cleaned of bowel incontinence. Soft formed brown stool present. Pt repositioned.

## 2020-02-24 NOTE — Progress Notes (Addendum)
PROGRESS NOTE  ZYNIAH SHEALY GUR:[id number] DOB: [birth year] DOA: 02/23/2020 PCP: [name], MD   LOS: 0 days   Brief narrative:  Ebony Scott is a 70 y.o. female with medical history significant of DVT on Xarelto, mild intermittent asthma, severe tracheobronchomalacia on at bedtime CPAP, chronic diastolic CHF, CKD stage II, osteoporosis, rheumatoid arthritis on chronic steroid, breast cancer in remission, recent right shoulder arthroplasty on 02/19/2020 who presented to the hospital with altered mental status.  Patient was very confused but had narcotics.  She does have sensitivity to narcotics in the past but was on tramadol.  When daughter checked in her house on the morning of presentation, patient was found lying in the couch with her right shoulder on ice pack unresponsive.  Had not been using her CPAP at nighttime as well.  In the ED, CT head and neck was negative for acute trauma.  UA was negative for UTI.  Chest x-ray was negative for pneumonia.  Patient remained lethargic in the ED. Patient was then admitted to hospital for further evaluation and treatment.    Assessment/Plan:  Active Problems:   AMS (altered mental status)  Mental status thought to be secondary to tramadol side effect. Will put the patient on CPAP.  Avoid narcotics.  Family had stated that she was very sensitive to Dilaudid in the past and was added onto allergy list.  VBG done yesterday showed pH of 7.5 with a low PCO2.  We will continue to monitor closely.  N.p.o. until mentation clears.  History of intermittent asthma tracheal bronchomalacia.  We will continue CPAP for now.  No active wheezing.  Continue inhalers.  CKD stage IIa Rheumatoid arthritis on chronic steroid.  Later changed to IV if unable to take p.o.  Right shoulder arthroplasty on 02/19/2020.  With extensive old bruise on the arm. Doesn't look infected on the shoulder.  Epic Messaged to Dr. Lisbeth Renshaw to look at the arm.  On Xarelto  Chest x-ray  with bibasilar atelectasis.  On empiric antibiotic.  Will discontinue antibiotics, since no change in the x-ray from before.  Afebrile, no leukocytosis.  No coughing symptoms.  DVT prophylaxis: rivaroxaban (XARELTO) tablet 20 mg Start: 02/23/20 1800 rivaroxaban (XARELTO) tablet 20 mg    Code Status:  Full code  Family Communication: None  Status is: Observation  The patient will require care spanning > 2 midnights and should be moved to inpatient because: IV treatments appropriate due to intensity of illness or inability to take PO and Inpatient level of care appropriate due to severity of illness, ongoing encephalopathy, unsafe disposition  Dispo: The patient is from: Home              Anticipated d/c is to: Home              Anticipated d/c date is: 2 days              Patient currently is not medically stable to d/c.  Consultants:  None  Procedures:  CPAP  Antibiotics:  . Discontinue antibiotic  Anti-infectives (From admission, onward)   Start     Dose/Rate Route Frequency Ordered Stop   02/25/20 0600  vancomycin (VANCOCIN) IVPB 1000 mg/200 mL premix        1,000 mg 200 mL/hr over 60 Minutes Intravenous Every 36 hours 02/24/20 0144     02/25/20 0330  vancomycin (VANCOCIN) IVPB 1000 mg/200 mL premix  Status:  Discontinued        1,000 mg  200 mL/hr over 60 Minutes Intravenous Every 36 hours 02/23/20 1544 02/23/20 1738   02/24/20 0230  ceFEPIme (MAXIPIME) 2 g in sodium chloride 0.9 % 100 mL IVPB  Status:  Discontinued        2 g 200 mL/hr over 30 Minutes Intravenous Every 12 hours 02/23/20 1549 02/23/20 1738   02/24/20 0145  ceFEPIme (MAXIPIME) 2 g in sodium chloride 0.9 % 100 mL IVPB        2 g 200 mL/hr over 30 Minutes Intravenous 2 times daily 02/24/20 0144     02/23/20 2330  metroNIDAZOLE (FLAGYL) tablet 500 mg        500 mg Oral Every 8 hours 02/23/20 2327     02/23/20 1800  fluconazole (DIFLUCAN) tablet 100 mg        100 mg Oral Daily 02/23/20 1740 02/27/20 0959    02/23/20 1745  cephALEXin (KEFLEX) capsule 500 mg  Status:  Discontinued       Note to Pharmacy: 10 DS     500 mg Oral 3 times daily 02/23/20 1737 02/23/20 1739   02/23/20 1745  fluconazole (DIFLUCAN) tablet 100 mg  Status:  Discontinued        100 mg Oral Daily 02/23/20 1737 02/23/20 1740   02/23/20 1400  vancomycin (VANCOREADY) IVPB 1500 mg/300 mL        1,500 mg 150 mL/hr over 120 Minutes Intravenous  Once 02/23/20 1351 02/23/20 1741   02/23/20 1345  ceFEPIme (MAXIPIME) 2 g in sodium chloride 0.9 % 100 mL IVPB        2 g 200 mL/hr over 30 Minutes Intravenous  Once 02/23/20 1332 02/23/20 1533   02/23/20 1330  aztreonam (AZACTAM) 2 g in sodium chloride 0.9 % 100 mL IVPB  Status:  Discontinued        2 g 200 mL/hr over 30 Minutes Intravenous  Once 02/23/20 1321 02/23/20 1328   02/23/20 1330  metroNIDAZOLE (FLAGYL) IVPB 500 mg        500 mg 100 mL/hr over 60 Minutes Intravenous  Once 02/23/20 1321 02/23/20 1741   02/23/20 1330  vancomycin (VANCOCIN) IVPB 1000 mg/200 mL premix  Status:  Discontinued        1,000 mg 200 mL/hr over 60 Minutes Intravenous  Once 02/23/20 1321 02/23/20 1351     Subjective: Today, patient was seen and examined at bedside.  Patient is very lethargic.  Mostly moaning.  Hurting when touched on the right shoulder area  Objective: Vitals:   02/24/20 0811 02/24/20 0916  BP: (!) 148/75   Pulse: 93 92  Resp: 19 (!) 22  Temp: 98.4 F (36.9 C)   SpO2: 98%     Intake/Output Summary (Last 24 hours) at 02/24/2020 1159 Last data filed at 02/24/2020 [id number] Gross per 24 hour  Intake 1000 ml  Output 402 ml  Net 598 ml   There were no vitals filed for this visit. There is no height or weight on file to calculate BMI.   Physical Exam:  GENERAL: Patient is somnolent, moaning, HENT: No scleral pallor or icterus. Pupils equally reactive to light. Oral mucosa is moist.  Left cheek with mild abrasion. NECK: is supple, no gross swelling noted. CHEST: Clear to  auscultation. No crackles or wheezes.  Diminished breath sounds bilaterally. CVS: S1 and S2 heard, no murmur. Regular rate and rhythm.  ABDOMEN: Soft, non-tender, bowel sounds are present. EXTREMITIES: No edema.  Right shoulder with recent surgery and extensive light bruise over the  mammary area and upper extremity. CNS: Cranial nerves are intact. No focal motor deficits. SKIN: warm and dry without rashes.  Extensive bruising over the chest wall arm which appears to be old.  Data Review: I have personally reviewed the following laboratory data and studies,  CBC: Recent Labs  Lab 02/18/20 0320 02/19/20 0817 02/23/20 0944 02/23/20 1018 02/23/20 1942 02/23/20 2326 02/24/20 0517  WBC 11.2* 11.5* 12.5*  --   --  9.7 10.4  NEUTROABS  --   --   --   --   --  7.8*  --   HGB 10.0* 9.8* 10.6* 11.2* 8.8* 9.4* 11.2*  HCT 31.7* 30.5* 32.6* 33.0* 26.0* 28.7* 35.5*  MCV 108.9* 106.6* 107.6*  --   --  107.9* 110.9*  PLT 142* 143* 198  --   --  182 [id number]   Basic Metabolic Panel: Recent Labs  Lab 02/18/20 0700 02/19/20 0817 02/19/20 0844 02/23/20 0944 02/23/20 1018 02/23/20 1942 02/24/20 0517  NA 138 139  --  138 136 139 141  K 2.8* 3.6  --  4.0 3.4* 3.3* 3.9  CL 100 103  --  96* 101  --  106  CO2 25 25  --  24  --   --  19*  GLUCOSE 182* 173*  --  187* 174*  --  138*  BUN 29* 31*  --  48* 44*  --  29*  CREATININE 1.71* 1.26*  --  1.52* 1.40*  --  1.25*  CALCIUM 7.8* 8.1*  --  9.3  --   --  8.6*  MG 1.9  --  1.8  --   --   --   --    Liver Function Tests: Recent Labs  Lab 02/19/20 0817 02/23/20 0944 02/24/20 0517  AST 56* 67* 63*  ALT 86* 115* 89*  ALKPHOS 39 71 67  BILITOT 1.0 1.5* 1.9*  PROT 5.6* 6.1* 5.4*  ALBUMIN 3.1* 2.9* 2.4*   No results for input(s): LIPASE, AMYLASE in the last 168 hours. Recent Labs  Lab 02/23/20 1242  AMMONIA 19   Cardiac Enzymes: Recent Labs  Lab 02/23/20 1234  CKTOTAL 468*   BNP (last 3 results) Recent Labs    05/07/19 0953  BNP 41     ProBNP (last 3 results) No results for input(s): PROBNP in the last 8760 hours.  CBG: No results for input(s): GLUCAP in the last 168 hours. Recent Results (from the past 240 hour(s))  SARS CORONAVIRUS 2 (TAT 6-24 HRS) Nasopharyngeal Nasopharyngeal Swab     Status: None   Collection Time: 02/15/20  1:23 PM   Specimen: Nasopharyngeal Swab  Result Value Ref Range Status   SARS Coronavirus 2 NEGATIVE NEGATIVE Final    Comment: (NOTE) SARS-CoV-2 target nucleic acids are NOT DETECTED.  The SARS-CoV-2 RNA is generally detectable in upper and lower respiratory specimens during the acute phase of infection. Negative results do not preclude SARS-CoV-2 infection, do not rule out co-infections with other pathogens, and should not be used as the sole basis for treatment or other patient management decisions. Negative results must be combined with clinical observations, patient history, and epidemiological information. The expected result is Negative.  Fact Sheet for Patients: SugarRoll.be  Fact Sheet for Healthcare Providers: https://www.woods-mathews.com/  This test is not yet approved or cleared by the [place] FDA and  has been authorized for detection and/or diagnosis of SARS-CoV-2 by FDA under an Emergency Use Authorization (EUA). This EUA will remain  in effect (meaning  this test can be used) for the duration of the COVID-19 declaration under Se ction 564(b)(1) of the Act, 21 U.S.C. section 360bbb-3(b)(1), unless the authorization is terminated or revoked sooner.  Performed at [facility], [address], [place], [place] [address]   Aerobic/Anaerobic Culture (surgical/deep wound)     Status: None   Collection Time: 02/16/20 11:10 AM   Specimen: PATH Soft tissue  Result Value Ref Range Status   Specimen Description   Final    TISSUE RIGHT HUMERUS Performed at [facility], [address],  [place], [place] [address]    Special Requests   Final    NONE Performed at [facility], [address] [place], [place] [address]    Gram Stain   Final    RARE WBC PRESENT,BOTH PMN AND MONONUCLEAR NO ORGANISMS SEEN    Culture   Final    No growth aerobically or anaerobically. Performed at [facility], [address], [place], [place] [address]    Report Status 02/21/2020 FINAL  Final  Resp Panel by RT-PCR (Flu A&B, Covid) Nasopharyngeal Swab     Status: None   Collection Time: 02/23/20 12:08 PM   Specimen: Nasopharyngeal Swab; Nasopharyngeal(NP) swabs in vial transport medium  Result Value Ref Range Status   SARS Coronavirus 2 by RT PCR NEGATIVE NEGATIVE Final    Comment: (NOTE) SARS-CoV-2 target nucleic acids are NOT DETECTED.  The SARS-CoV-2 RNA is generally detectable in upper respiratory specimens during the acute phase of infection. The lowest concentration of SARS-CoV-2 viral copies this assay can detect is 138 copies/mL. A negative result does not preclude SARS-Cov-2 infection and should not be used as the sole basis for treatment or other patient management decisions. A negative result may occur with  improper specimen collection/handling, submission of specimen other than nasopharyngeal swab, presence of viral mutation(s) within the areas targeted by this assay, and inadequate number of viral copies(<138 copies/mL). A negative result must be combined with clinical observations, patient history, and epidemiological information. The expected result is Negative.  Fact Sheet for Patients:  BloggerCourse.com  Fact Sheet for Healthcare Providers:  SeriousBroker.it  This test is no t yet approved or cleared by the [place] FDA and  has been authorized for detection and/or diagnosis of SARS-CoV-2 by FDA under an Emergency Use Authorization (EUA). This EUA will remain  in effect (meaning this  test can be used) for the duration of the COVID-19 declaration under Section 564(b)(1) of the Act, 21 U.S.C.section 360bbb-3(b)(1), unless the authorization is terminated  or revoked sooner.       Influenza A by PCR NEGATIVE NEGATIVE Final   Influenza B by PCR NEGATIVE NEGATIVE Final    Comment: (NOTE) The Xpert Xpress SARS-CoV-2/FLU/RSV plus assay is intended as an aid in the diagnosis of influenza from Nasopharyngeal swab specimens and should not be used as a sole basis for treatment. Nasal washings and aspirates are unacceptable for Xpert Xpress SARS-CoV-2/FLU/RSV testing.  Fact Sheet for Patients: BloggerCourse.com  Fact Sheet for Healthcare Providers: SeriousBroker.it  This test is not yet approved or cleared by the [place] FDA and has been authorized for detection and/or diagnosis of SARS-CoV-2 by FDA under an Emergency Use Authorization (EUA). This EUA will remain in effect (meaning this test can be used) for the duration of the COVID-19 declaration under Section 564(b)(1) of the Act, 21 U.S.C. section 360bbb-3(b)(1), unless the authorization is terminated or revoked.  Performed at [facility], [place] [address] [place], [place] [address]   Culture, blood (routine x 2)     Status: None (Preliminary result)   Collection Time: 02/23/20  2:40 PM   Specimen: BLOOD  Result Value Ref Range Status   Specimen Description BLOOD SITE NOT SPECIFIED  Final   Special Requests   Final    BOTTLES DRAWN AEROBIC AND ANAEROBIC Blood Culture adequate volume   Culture   Final    NO GROWTH < 24 HOURS Performed at [facility], [place] [address], [place], [place] [address]    Report Status PENDING  Incomplete     Studies: EEG  Result Date: 02/24/2020 Lora Havens, MD     02/24/2020  8:53 AM Patient Name: Ebony Scott MRN: [id number] Epilepsy Attending: Lora Havens Referring Physician/Provider: Delia Heady, PA  Date: 02/23/2019 Duration: 23.43 minutes Patient history: 70 year old female with altered mental status. EEG to evaluate for seizures. Level of alertness: Awake AEDs during EEG study: Gabapentin Technical aspects: This EEG study was done with scalp electrodes positioned according to the 10-20 International system of electrode placement. Electrical activity was acquired at a sampling rate of 500Hz  and reviewed with a high frequency filter of 70Hz  and a low frequency filter of 1Hz . EEG data were recorded continuously and digitally stored. Description: No clear posterior dominant rhythm was seen. EEG showed continuous generalized 3 to 5 Hz theta-delta slowing. Triphasic waves, generalized, maximal bifrontal were noted. Hyperventilation and photic stimulation were not performed.   ABNORMALITY -Continuous slow, generalized -Triphasic waves, generalized IMPRESSION: This study is suggestive of moderate diffuse encephalopathy, nonspecific etiology but could be secondary to toxic, metabolic causes. No seizures or definite epileptiform discharges were seen throughout the recording. Lora Havens   CT HEAD WO CONTRAST  Result Date: 02/23/2020 CLINICAL DATA:  Trauma.  Fall. EXAM: CT HEAD WITHOUT CONTRAST CT CERVICAL SPINE WITHOUT CONTRAST TECHNIQUE: Multidetector CT imaging of the head and cervical spine was performed following the standard protocol without intravenous contrast. Multiplanar CT image reconstructions of the cervical spine were also generated. COMPARISON:  Radiographs October 09, 2013. CT head March 06, 2018. FINDINGS: CT HEAD FINDINGS Brain: No evidence of acute large vascular territory infarction, hemorrhage, hydrocephalus, extra-axial collection or mass lesion/mass effect. Patchy white matter hypoattenuation, most likely related to chronic microvascular ischemic disease. Mild generalized cerebral volume loss with ex vacuo ventricular dilation. Vascular: Calcific atherosclerosis. Skull: No acute fracture.  Sinuses/Orbits: Ethmoid air cell mucosal thickening without air-fluid levels. Unremarkable orbits. Other: No mastoid effusions. CT CERVICAL SPINE FINDINGS Nondiagnostic evaluation at the craniocervical junction secondary to motion. Within this limitation: Alignment: There is approximately 5 mm of anterolisthesis of C4 on C5, favored chronic/degenerative given anterolisthesis on prior radiographs from October 09, 2013. Otherwise, no substantial sagittal subluxation. Skull base and vertebrae: Osseous fusion of the C6-C7 vertebral bodies. ACDF spanning C3-C4. No evidence of acute fracture. Vertebral body heights are maintained. Post cerclage wires at C6-C7. Soft tissues and spinal canal: No visible prevertebral fluid or swelling. No visible canal hematoma, although evaluation of the canal is limited by motion. Disc levels: Multilevel facet hypertrophy. Multilevel mild degenerative disc disease. Upper chest: Negative. IMPRESSION: 1. No evidence of acute intracranial abnormality. 2. No evidence of acute cervical spine fracture, although evaluation at the craniocervical junction is nondiagnostic secondary to motion. 3. Approximately 5 mm of anterolisthesis of C4 on C5, favored chronic/degenerative given anterolisthesis on prior radiographs from October 09, 2013. Electronically Signed   By: Margaretha Sheffield  MD   On: 02/23/2020 10:57   CT CERVICAL SPINE WO CONTRAST  Result Date: 02/23/2020 CLINICAL DATA:  Trauma.  Fall. EXAM: CT HEAD WITHOUT CONTRAST CT CERVICAL SPINE WITHOUT CONTRAST TECHNIQUE: Multidetector CT imaging of the head and cervical spine was performed following the standard protocol without intravenous contrast. Multiplanar CT image reconstructions of the cervical spine were also generated. COMPARISON:  Radiographs October 09, 2013. CT head March 06, 2018. FINDINGS: CT HEAD FINDINGS Brain: No evidence of acute large vascular territory infarction, hemorrhage, hydrocephalus, extra-axial collection or mass  lesion/mass effect. Patchy white matter hypoattenuation, most likely related to chronic microvascular ischemic disease. Mild generalized cerebral volume loss with ex vacuo ventricular dilation. Vascular: Calcific atherosclerosis. Skull: No acute fracture. Sinuses/Orbits: Ethmoid air cell mucosal thickening without air-fluid levels. Unremarkable orbits. Other: No mastoid effusions. CT CERVICAL SPINE FINDINGS Nondiagnostic evaluation at the craniocervical junction secondary to motion. Within this limitation: Alignment: There is approximately 5 mm of anterolisthesis of C4 on C5, favored chronic/degenerative given anterolisthesis on prior radiographs from October 09, 2013. Otherwise, no substantial sagittal subluxation. Skull base and vertebrae: Osseous fusion of the C6-C7 vertebral bodies. ACDF spanning C3-C4. No evidence of acute fracture. Vertebral body heights are maintained. Post cerclage wires at C6-C7. Soft tissues and spinal canal: No visible prevertebral fluid or swelling. No visible canal hematoma, although evaluation of the canal is limited by motion. Disc levels: Multilevel facet hypertrophy. Multilevel mild degenerative disc disease. Upper chest: Negative. IMPRESSION: 1. No evidence of acute intracranial abnormality. 2. No evidence of acute cervical spine fracture, although evaluation at the craniocervical junction is nondiagnostic secondary to motion. 3. Approximately 5 mm of anterolisthesis of C4 on C5, favored chronic/degenerative given anterolisthesis on prior radiographs from October 09, 2013. Electronically Signed   By: Margaretha Sheffield MD   On: 02/23/2020 10:57   DG Pelvis Portable  Result Date: 02/23/2020 CLINICAL DATA:  Unwitnessed fall EXAM: PORTABLE PELVIS 1-2 VIEWS COMPARISON:  None. FINDINGS: There is no evidence of pelvic fracture or dislocation. Mild symmetric narrowing of each hip joint noted. Degenerative change noted in visualized lower lumbar spine. No erosive change. IMPRESSION: Mild  narrowing each hip joint.  No fracture or dislocation. Electronically Signed   By: Lowella Grip III M.D.   On: 02/23/2020 10:21   DG CHEST PORT 1 VIEW  Result Date: 02/24/2020 CLINICAL DATA:  Systemic inflammatory response syndrome EXAM: PORTABLE CHEST 1 VIEW COMPARISON:  02/23/2020 FINDINGS: Stable mild elevation of the right hemidiaphragm. Mild bibasilar atelectasis. No pneumothorax or pleural effusion. Cardiac size is within normal limits. No acute bone abnormality. Right total shoulder arthroplasty and cervical cerclage wire are noted. Multiple surgical clips surrounding and area of dystrophic calcification within the left breast. IMPRESSION: Mild bibasilar atelectasis.  No interval change. Electronically Signed   By: Fidela Salisbury MD   On: 02/24/2020 04:56   DG Chest Port 1 View  Result Date: 02/23/2020 CLINICAL DATA:  Trauma.  Found on floor. EXAM: PORTABLE CHEST 1 VIEW COMPARISON:  02/18/2020 FINDINGS: Cardiac and mediastinal contours normal. Vascularity normal. Mild atelectasis in the bases. No effusion. Apical pleural thickening bilaterally. Right shoulder replacement.  Prior cervical spine for surgery. IMPRESSION: Mild bibasilar atelectasis. Electronically Signed   By: Franchot Gallo M.D.   On: 02/23/2020 10:21   DG Shoulder Right Port  Result Date: 02/23/2020 CLINICAL DATA:  Pain following fall EXAM: PORTABLE RIGHT SHOULDER: 2 + V COMPARISON:  February 16, 2020. FINDINGS: Oblique, frontal, and Y scapular images were obtained. Patient is status  post total shoulder replacement with prosthetic components well-seated. No acute fracture or evident dislocation. Extensive bursal calcification laterally appear stable. No erosion. Visualized right lung clear. IMPRESSION: Total shoulder replacement with prosthetic components appearing well-seated. No acute fracture or dislocation. Extensive bursal calcification is stable. Electronically Signed   By: Lowella Grip III M.D.   On: 02/23/2020 10:24       Flora Lipps, MD  [facility] 02/24/2020  If 7PM-7AM, please contact night-coverage

## 2020-02-24 NOTE — Progress Notes (Signed)
New Admission Note:  Arrival Method: via stretcher from ER  Mental Orientation: alert & oriented x 3; not time  Telemetry:18m08  Skin: large bruise on L cheek abrasion on middle of chin Small abrasion on L ear lope Both arms bruised bilaterally in different stages Most of the enitre R side bruised at different stages Bruising on both knees Petechial like spots on L leg Bruising on the R shoulder R shoulder surgical incision  IV: L AC & L wrist  Pain:0/10 Safety Measures: Safety Fall Prevention Plan was given, discussed. Admission: Completed 5M16: Patient has been orientated to the room, unit and the staff. Family: No family currently at the bedside   Orders have been reviewed and implemented. Will continue to monitor the patient. Call light has been placed within reach and bed alarm has been activated.   Lawernce Ion ,RN

## 2020-02-24 NOTE — Progress Notes (Signed)
Orthopedic Tech Progress Note Patient Details:  Ebony Scott 1950/12/17 [id number]  Ortho Devices Type of Ortho Device: Shoulder immobilizer Ortho Device/Splint Location: RUE Ortho Device/Splint Interventions: Ordered,Adjustment   Post Interventions Patient Tolerated: Fair Instructions Provided: Care of device   Donald Pore 02/24/2020, 6:10 PM

## 2020-02-24 NOTE — Progress Notes (Signed)
Cross-coverage note:   Patient seen for new fever.   She was admitted with encephalopathy, had leukocytosis and elevated lactate which prompted blood culture and one dose of antibiotics in ED, but non-infectious etiology was suspected and antibiotics not continued.   Abdominal exam benign, UA not suggestive of infection, CXR appeared clear, and no meningismus. She has extensive bruising but no appreciable cellulitis or abscess noted. There is scattered rhonchi on auscultation of her chest and she is mildly tachypneic but not hypoxic.   Plan to resume broad-spectrum antibiotics, repeat CXR in am, follow culture and clinical course.

## 2020-02-24 NOTE — ED Notes (Signed)
Pt's daughter updated on current status and inpatient bed upstairs.

## 2020-02-24 NOTE — ED Notes (Signed)
Report given to Eugenie Norrie, [place]

## 2020-02-25 ENCOUNTER — Inpatient Hospital Stay (HOSPITAL_COMMUNITY): Payer: Medicare PPO

## 2020-02-25 ENCOUNTER — Encounter (HOSPITAL_COMMUNITY): Payer: Self-pay | Admitting: *Deleted

## 2020-02-25 ENCOUNTER — Ambulatory Visit: Payer: Medicare PPO | Admitting: Podiatry

## 2020-02-25 DIAGNOSIS — E782 Mixed hyperlipidemia: Secondary | ICD-10-CM

## 2020-02-25 DIAGNOSIS — G9341 Metabolic encephalopathy: Secondary | ICD-10-CM | POA: Diagnosis not present

## 2020-02-25 DIAGNOSIS — M542 Cervicalgia: Secondary | ICD-10-CM | POA: Diagnosis not present

## 2020-02-25 DIAGNOSIS — K219 Gastro-esophageal reflux disease without esophagitis: Secondary | ICD-10-CM | POA: Diagnosis not present

## 2020-02-25 DIAGNOSIS — Z853 Personal history of malignant neoplasm of breast: Secondary | ICD-10-CM | POA: Diagnosis not present

## 2020-02-25 DIAGNOSIS — Z96611 Presence of right artificial shoulder joint: Secondary | ICD-10-CM

## 2020-02-25 LAB — BLOOD GAS, VENOUS
Acid-base deficit: 1.2 mmol/L (ref 0.0–2.0)
Bicarbonate: 22.7 mmol/L (ref 20.0–28.0)
O2 Saturation: 57.7 %
Patient temperature: 37
pCO2, Ven: 36.2 mmHg — ABNORMAL LOW (ref 44.0–60.0)
pH, Ven: 7.415 (ref 7.250–7.430)
pO2, Ven: 33.5 mmHg (ref 32.0–45.0)

## 2020-02-25 MED ORDER — GABAPENTIN 100 MG PO CAPS
100.0000 mg | ORAL_CAPSULE | Freq: Two times a day (BID) | ORAL | Status: DC
Start: 2020-02-25 — End: 2020-02-26
  Administered 2020-02-25: 100 mg via ORAL
  Filled 2020-02-25: qty 1

## 2020-02-25 MED ORDER — LORAZEPAM 2 MG/ML IJ SOLN
1.0000 mg | Freq: Once | INTRAMUSCULAR | Status: AC
  Administered 2020-02-25: 1 mg via INTRAVENOUS
  Filled 2020-02-25: qty 1

## 2020-02-25 MED ORDER — BACLOFEN 10 MG PO TABS: 10.0000 mg | ORAL_TABLET | Freq: Every day | ORAL | Status: DC

## 2020-02-25 NOTE — Progress Notes (Signed)
Patient not placed on CPAP at this time.  Patient will not tolerate.

## 2020-02-25 NOTE — Progress Notes (Addendum)
PROGRESS NOTE  Ebony Scott DTO:[id number] DOB: [birth year] DOA: 02/23/2020 PCP: [name], MD   LOS: 1 day   Brief narrative:  Ebony Scott is a 70 y.o. female with medical history significant of DVT on Xarelto, mild intermittent asthma, severe tracheobronchomalacia on at bedtime CPAP, chronic diastolic CHF, CKD stage II, osteoporosis, rheumatoid arthritis on chronic steroid, breast cancer in remission, recent right shoulder arthroplasty on 02/19/2020 who presented to the hospital with altered mental status.  Patient was very confused but had narcotics.  She does have sensitivity to narcotics in the past, but was on tramadol.  When daughter checked in her house on the morning of presentation, patient was found lying in the couch with her right shoulder on ice pack unresponsive.  Had not been using her CPAP at nighttime as well.  In the ED, CT head and neck was negative for acute trauma.  UA was negative for UTI.  Chest x-ray was negative for pneumonia.  Patient remained lethargic in the ED. Patient was then admitted to hospital for further evaluation and treatment.    Assessment/Plan:  Active Problems:   AMS (altered mental status)   Metabolic encephalopathy  Altered mental status likely acute metabolic encephalopathy thought to be secondary to tramadol side effect.  Should have improved by now.  Still encephalopathic. we will continue to monitor closely.  Continue CPAP for sleep apnea..  CT head scan and C-spine negative for acute findings.  Folic acid at 98.3.  Vitamin B12 within normal range.  TSH 1.5.  UA was negative for infection.  Chest x-ray unchanged and no overt signs of infection.  Ammonia level was 19 and within normal limits.  Venous blood gas was negative for hypercarbia.  Patient with history of confusion in the past. EEG with metabolic encephalopathy.  Unclear etiology of encephalopathy at this time.  Will obtain MRI of the brain to rule out stroke.  Will consult neurology at  this time.  History of intermittent asthma, tracheobronchomalacia.  We will continue CPAP.  VBG noted without hypercapnia.  No active wheezing.  Continue nebulizers.  CKD stage IIIa Will closely monitor BMP.  Rheumatoid arthritis on chronic steroid.  Will change to IV if unable to take p.o.  Right shoulder arthroplasty on 02/19/2020.  With extensive old bruise on the arm.  No tense hematoma.  Hemoglobin doesn't look infected.  Seen by orthopedics.  On Xarelto, will continue for now  History of DVT and PE. On xarelto as outpatient.  Likely on continued Xarelto secondary to history of breast cancer  Chest x-ray with bibasilar atelectasis.   Afebrile, no leukocytosis.  No symptoms.  No need for antibiotics.  Patient negative.  Lactate was 1.3.  Blood cultures negative in less than 24 hours.  DVT prophylaxis: rivaroxaban (XARELTO) tablet 20 mg Start: 02/23/20 1800 rivaroxaban (XARELTO) tablet 20 mg   Code Status:  Full code  Family Communication: I spoke with the patient's daughter Ms [name] on the phone and updated her about the clinical condition of the patient.  Status is: Inpatient  The patient is inpatient because: IV treatments appropriate due to intensity of illness or inability to take PO and Inpatient level of care appropriate due to severity of illness, ongoing encephalopathy, unsafe disposition  Dispo: The patient is from: Home              Anticipated d/c is to: Home with home health/undetermined.  Will get PT evaluation, pending  Anticipated d/c date is: 2 days              Patient currently is not medically stable to d/c.  Consultants: Orthopedics  Neurology  Procedures: CPAP  Antibiotics:  None   Subjective: Today, patient was seen and examined at bedside. Not much improvement in the mentation.  Speaking 1 or 2 words after persistent effort.  Continue to close the eyes.  Objective: Vitals:   02/24/20 2028 02/24/20 2034  BP: (!) 130/93   Pulse:  92   Resp: 20   Temp: 97.8 F (36.6 C)   SpO2: 99% 99%    Intake/Output Summary (Last 24 hours) at 02/25/2020 0723 Last data filed at 02/24/2020 2200 Gross per 24 hour  Intake 377.22 ml  Output 2 ml  Net 375.22 ml   There were no vitals filed for this visit. There is no height or weight on file to calculate BMI.   Physical Exam:  GENERAL: Somnolent, speaks 1 or 2 words after persistent effort. HENT: No scleral pallor or icterus. Pupils equally reactive to light. Oral mucosa is moist.  Left cheek with mild abrasion. NECK: is supple, no gross swelling noted. CHEST: Clear to auscultation. No crackles or wheezes.  Diminished breath sounds bilaterally. CVS: S1 and S2 heard, no murmur. Regular rate and rhythm.  ABDOMEN: Soft, non-tender, bowel sounds are present. EXTREMITIES: No edema.  Right shoulder with recent surgery and extensive light bruise over the mammary area and upper extremity. CNS: Somnolent, moving extremities, minimally verbal SKIN: warm and dry without rashes.  Extensive bruising over the chest wall,arm and forearm which appears to resolving.  Data Review: I have personally reviewed the following laboratory data and studies,  CBC: Recent Labs  Lab 02/19/20 0817 02/23/20 0944 02/23/20 1018 02/23/20 1942 02/23/20 2326 02/24/20 0517  WBC 11.5* 12.5*  --   --  9.7 10.4  NEUTROABS  --   --   --   --  7.8*  --   HGB 9.8* 10.6* 11.2* 8.8* 9.4* 11.2*  HCT 30.5* 32.6* 33.0* 26.0* 28.7* 35.5*  MCV 106.6* 107.6*  --   --  107.9* 110.9*  PLT 143* 198  --   --  182 [id number]   Basic Metabolic Panel: Recent Labs  Lab 02/19/20 0817 02/19/20 0844 02/23/20 0944 02/23/20 1018 02/23/20 1942 02/24/20 0517  NA 139  --  138 136 139 141  K 3.6  --  4.0 3.4* 3.3* 3.9  CL 103  --  96* 101  --  106  CO2 25  --  24  --   --  19*  GLUCOSE 173*  --  187* 174*  --  138*  BUN 31*  --  48* 44*  --  29*  CREATININE 1.26*  --  1.52* 1.40*  --  1.25*  CALCIUM 8.1*  --  9.3  --   --  8.6*   MG  --  1.8  --   --   --   --    Liver Function Tests: Recent Labs  Lab 02/19/20 0817 02/23/20 0944 02/24/20 0517  AST 56* 67* 63*  ALT 86* 115* 89*  ALKPHOS 39 71 67  BILITOT 1.0 1.5* 1.9*  PROT 5.6* 6.1* 5.4*  ALBUMIN 3.1* 2.9* 2.4*   No results for input(s): LIPASE, AMYLASE in the last 168 hours. Recent Labs  Lab 02/23/20 1242  AMMONIA 19   Cardiac Enzymes: Recent Labs  Lab 02/23/20 1234  CKTOTAL 468*   BNP (last  3 results) Recent Labs    05/07/19 0953  BNP 41    ProBNP (last 3 results) No results for input(s): PROBNP in the last 8760 hours.  CBG: No results for input(s): GLUCAP in the last 168 hours. Recent Results (from the past 240 hour(s))  SARS CORONAVIRUS 2 (TAT 6-24 HRS) Nasopharyngeal Nasopharyngeal Swab     Status: None   Collection Time: 02/15/20  1:23 PM   Specimen: Nasopharyngeal Swab  Result Value Ref Range Status   SARS Coronavirus 2 NEGATIVE NEGATIVE Final    Comment: (NOTE) SARS-CoV-2 target nucleic acids are NOT DETECTED.  The SARS-CoV-2 RNA is generally detectable in upper and lower respiratory specimens during the acute phase of infection. Negative results do not preclude SARS-CoV-2 infection, do not rule out co-infections with other pathogens, and should not be used as the sole basis for treatment or other patient management decisions. Negative results must be combined with clinical observations, patient history, and epidemiological information. The expected result is Negative.  Fact Sheet for Patients: SugarRoll.be  Fact Sheet for Healthcare Providers: https://www.woods-mathews.com/  This test is not yet approved or cleared by the [place] FDA and  has been authorized for detection and/or diagnosis of SARS-CoV-2 by FDA under an Emergency Use Authorization (EUA). This EUA will remain  in effect (meaning this test can be used) for the duration of the COVID-19 declaration under Se  ction 564(b)(1) of the Act, 21 U.S.C. section 360bbb-3(b)(1), unless the authorization is terminated or revoked sooner.  Performed at [facility], [place] [address] [place], [place] [address]   Aerobic/Anaerobic Culture (surgical/deep wound)     Status: None   Collection Time: 02/16/20 11:10 AM   Specimen: PATH Soft tissue  Result Value Ref Range Status   Specimen Description   Final    TISSUE RIGHT HUMERUS Performed at [facility] [address] [place], [place] [address]    Special Requests   Final    NONE Performed at [facility], [place] [address] [place], [place] [address]    Gram Stain   Final    RARE WBC PRESENT,BOTH PMN AND MONONUCLEAR NO ORGANISMS SEEN    Culture   Final    No growth aerobically or anaerobically. Performed at [facility], [place] [address] [place], [place] [address]    Report Status 02/21/2020 FINAL  Final  Resp Panel by RT-PCR (Flu A&B, Covid) Nasopharyngeal Swab     Status: None   Collection Time: 02/23/20 12:08 PM   Specimen: Nasopharyngeal Swab; Nasopharyngeal(NP) swabs in vial transport medium  Result Value Ref Range Status   SARS Coronavirus 2 by RT PCR NEGATIVE NEGATIVE Final    Comment: (NOTE) SARS-CoV-2 target nucleic acids are NOT DETECTED.  The SARS-CoV-2 RNA is generally detectable in upper respiratory specimens during the acute phase of infection. The lowest concentration of SARS-CoV-2 viral copies this assay can detect is 138 copies/mL. A negative result does not preclude SARS-Cov-2 infection and should not be used as the sole basis for treatment or other patient management decisions. A negative result may occur with  improper specimen collection/handling, submission of specimen other than nasopharyngeal swab, presence of viral mutation(s) within the areas targeted by this assay, and inadequate number of viral copies(<138 copies/mL). A negative result must be combined with clinical  observations, patient history, and epidemiological information. The expected result is Negative.  Fact Sheet for Patients:  EntrepreneurPulse.com.au  Fact Sheet for Healthcare Providers:  IncredibleEmployment.be  This  test is no t yet approved or cleared by the [place] and  has been authorized for detection and/or diagnosis of SARS-CoV-2 by FDA under an Emergency Use Authorization (EUA). This EUA will remain  in effect (meaning this test can be used) for the duration of the COVID-19 declaration under Section 564(b)(1) of the Act, 21 U.S.C.section 360bbb-3(b)(1), unless the authorization is terminated  or revoked sooner.       Influenza A by PCR NEGATIVE NEGATIVE Final   Influenza B by PCR NEGATIVE NEGATIVE Final    Comment: (NOTE) The Xpert Xpress SARS-CoV-2/FLU/RSV plus assay is intended as an aid in the diagnosis of influenza from Nasopharyngeal swab specimens and should not be used as a sole basis for treatment. Nasal washings and aspirates are unacceptable for Xpert Xpress SARS-CoV-2/FLU/RSV testing.  Fact Sheet for Patients: EntrepreneurPulse.com.au  Fact Sheet for Healthcare Providers: IncredibleEmployment.be  This test is not yet approved or cleared by the [place] FDA and has been authorized for detection and/or diagnosis of SARS-CoV-2 by FDA under an Emergency Use Authorization (EUA). This EUA will remain in effect (meaning this test can be used) for the duration of the COVID-19 declaration under Section 564(b)(1) of the Act, 21 U.S.C. section 360bbb-3(b)(1), unless the authorization is terminated or revoked.  Performed at [facility], [place] [address], [place], [place] [address]   Culture, blood (routine x 2)     Status: None (Preliminary result)   Collection Time: 02/23/20  2:40 PM   Specimen: BLOOD  Result Value Ref Range Status   Specimen Description BLOOD SITE NOT  SPECIFIED  Final   Special Requests   Final    BOTTLES DRAWN AEROBIC AND ANAEROBIC Blood Culture adequate volume   Culture   Final    NO GROWTH < 24 HOURS Performed at [facility], [place] [address], [place], [place] [address]    Report Status PENDING  Incomplete  MRSA PCR Screening     Status: None   Collection Time: 02/24/20  4:35 PM   Specimen: Nasal Mucosa; Nasopharyngeal  Result Value Ref Range Status   MRSA by PCR NEGATIVE NEGATIVE Final    Comment:        The GeneXpert MRSA Assay (FDA approved for NASAL specimens only), is one component of a comprehensive MRSA colonization surveillance program. It is not intended to diagnose MRSA infection nor to guide or monitor treatment for MRSA infections. Performed at [facility], [place] [address] [place],  [address]      Studies: EEG  Result Date: 02/24/2020 Ebony Havens, MD     02/24/2020  8:53 AM Patient Name: KARINNE STUVER MRN: [id number] Epilepsy Attending: Lora Scott Referring Physician/Provider: Delia Heady, PA Date: 02/23/2019 Duration: 23.43 minutes Patient history: 70 year old female with altered mental status. EEG to evaluate for seizures. Level of alertness: Awake AEDs during EEG study: Gabapentin Technical aspects: This EEG study was done with scalp electrodes positioned according to the 10-20 International system of electrode placement. Electrical activity was acquired at a sampling rate of 500Hz  and reviewed with a high frequency filter of 70Hz  and a low frequency filter of 1Hz . EEG data were recorded continuously and digitally stored. Description: No clear posterior dominant rhythm was seen. EEG showed continuous generalized 3 to 5 Hz theta-delta slowing. Triphasic waves, generalized, maximal bifrontal were noted. Hyperventilation and photic stimulation were not performed.   ABNORMALITY -Continuous slow, generalized -Triphasic waves, generalized IMPRESSION: This study is suggestive of moderate  diffuse  encephalopathy, nonspecific etiology but could be secondary to toxic, metabolic causes. No seizures or definite epileptiform discharges were seen throughout the recording. Ebony Scott   CT HEAD WO CONTRAST  Result Date: 02/23/2020 CLINICAL DATA:  Trauma.  Fall. EXAM: CT HEAD WITHOUT CONTRAST CT CERVICAL SPINE WITHOUT CONTRAST TECHNIQUE: Multidetector CT imaging of the head and cervical spine was performed following the standard protocol without intravenous contrast. Multiplanar CT image reconstructions of the cervical spine were also generated. COMPARISON:  Radiographs October 09, 2013. CT head March 06, 2018. FINDINGS: CT HEAD FINDINGS Brain: No evidence of acute large vascular territory infarction, hemorrhage, hydrocephalus, extra-axial collection or mass lesion/mass effect. Patchy white matter hypoattenuation, most likely related to chronic microvascular ischemic disease. Mild generalized cerebral volume loss with ex vacuo ventricular dilation. Vascular: Calcific atherosclerosis. Skull: No acute fracture. Sinuses/Orbits: Ethmoid air cell mucosal thickening without air-fluid levels. Unremarkable orbits. Other: No mastoid effusions. CT CERVICAL SPINE FINDINGS Nondiagnostic evaluation at the craniocervical junction secondary to motion. Within this limitation: Alignment: There is approximately 5 mm of anterolisthesis of C4 on C5, favored chronic/degenerative given anterolisthesis on prior radiographs from October 09, 2013. Otherwise, no substantial sagittal subluxation. Skull base and vertebrae: Osseous fusion of the C6-C7 vertebral bodies. ACDF spanning C3-C4. No evidence of acute fracture. Vertebral body heights are maintained. Post cerclage wires at C6-C7. Soft tissues and spinal canal: No visible prevertebral fluid or swelling. No visible canal hematoma, although evaluation of the canal is limited by motion. Disc levels: Multilevel facet hypertrophy. Multilevel mild degenerative disc disease. Upper chest:  Negative. IMPRESSION: 1. No evidence of acute intracranial abnormality. 2. No evidence of acute cervical spine fracture, although evaluation at the craniocervical junction is nondiagnostic secondary to motion. 3. Approximately 5 mm of anterolisthesis of C4 on C5, favored chronic/degenerative given anterolisthesis on prior radiographs from October 09, 2013. Electronically Signed   By: Feliberto Harts MD   On: 02/23/2020 10:57   CT CERVICAL SPINE WO CONTRAST  Result Date: 02/23/2020 CLINICAL DATA:  Trauma.  Fall. EXAM: CT HEAD WITHOUT CONTRAST CT CERVICAL SPINE WITHOUT CONTRAST TECHNIQUE: Multidetector CT imaging of the head and cervical spine was performed following the standard protocol without intravenous contrast. Multiplanar CT image reconstructions of the cervical spine were also generated. COMPARISON:  Radiographs October 09, 2013. CT head March 06, 2018. FINDINGS: CT HEAD FINDINGS Brain: No evidence of acute large vascular territory infarction, hemorrhage, hydrocephalus, extra-axial collection or mass lesion/mass effect. Patchy white matter hypoattenuation, most likely related to chronic microvascular ischemic disease. Mild generalized cerebral volume loss with ex vacuo ventricular dilation. Vascular: Calcific atherosclerosis. Skull: No acute fracture. Sinuses/Orbits: Ethmoid air cell mucosal thickening without air-fluid levels. Unremarkable orbits. Other: No mastoid effusions. CT CERVICAL SPINE FINDINGS Nondiagnostic evaluation at the craniocervical junction secondary to motion. Within this limitation: Alignment: There is approximately 5 mm of anterolisthesis of C4 on C5, favored chronic/degenerative given anterolisthesis on prior radiographs from October 09, 2013. Otherwise, no substantial sagittal subluxation. Skull base and vertebrae: Osseous fusion of the C6-C7 vertebral bodies. ACDF spanning C3-C4. No evidence of acute fracture. Vertebral body heights are maintained. Post cerclage wires at C6-C7.  Soft tissues and spinal canal: No visible prevertebral fluid or swelling. No visible canal hematoma, although evaluation of the canal is limited by motion. Disc levels: Multilevel facet hypertrophy. Multilevel mild degenerative disc disease. Upper chest: Negative. IMPRESSION: 1. No evidence of acute intracranial abnormality. 2. No evidence of acute cervical spine fracture, although evaluation at the craniocervical junction is nondiagnostic  secondary to motion. 3. Approximately 5 mm of anterolisthesis of C4 on C5, favored chronic/degenerative given anterolisthesis on prior radiographs from October 09, 2013. Electronically Signed   By: Margaretha Sheffield MD   On: 02/23/2020 10:57   DG Pelvis Portable  Result Date: 02/23/2020 CLINICAL DATA:  Unwitnessed fall EXAM: PORTABLE PELVIS 1-2 VIEWS COMPARISON:  None. FINDINGS: There is no evidence of pelvic fracture or dislocation. Mild symmetric narrowing of each hip joint noted. Degenerative change noted in visualized lower lumbar spine. No erosive change. IMPRESSION: Mild narrowing each hip joint.  No fracture or dislocation. Electronically Signed   By: Lowella Grip III M.D.   On: 02/23/2020 10:21   DG CHEST PORT 1 VIEW  Result Date: 02/24/2020 CLINICAL DATA:  Systemic inflammatory response syndrome EXAM: PORTABLE CHEST 1 VIEW COMPARISON:  02/23/2020 FINDINGS: Stable mild elevation of the right hemidiaphragm. Mild bibasilar atelectasis. No pneumothorax or pleural effusion. Cardiac size is within normal limits. No acute bone abnormality. Right total shoulder arthroplasty and cervical cerclage wire are noted. Multiple surgical clips surrounding and area of dystrophic calcification within the left breast. IMPRESSION: Mild bibasilar atelectasis.  No interval change. Electronically Signed   By: Fidela Salisbury MD   On: 02/24/2020 04:56   DG Chest Port 1 View  Result Date: 02/23/2020 CLINICAL DATA:  Trauma.  Found on floor. EXAM: PORTABLE CHEST 1 VIEW COMPARISON:   02/18/2020 FINDINGS: Cardiac and mediastinal contours normal. Vascularity normal. Mild atelectasis in the bases. No effusion. Apical pleural thickening bilaterally. Right shoulder replacement.  Prior cervical spine for surgery. IMPRESSION: Mild bibasilar atelectasis. Electronically Signed   By: Franchot Gallo M.D.   On: 02/23/2020 10:21   DG Shoulder Right Port  Result Date: 02/23/2020 CLINICAL DATA:  Pain following fall EXAM: PORTABLE RIGHT SHOULDER: 2 + V COMPARISON:  February 16, 2020. FINDINGS: Oblique, frontal, and Y scapular images were obtained. 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 No acute fracture or evident dislocation. Extensive bursal calcification laterally appear stable. No erosion. Visualized right lung clear. IMPRESSION: Total shoulder replacement with prosthetic components appearing well-seated. No acute fracture or dislocation. Extensive bursal calcification is stable. Electronically Signed   By: Lowella Grip III M.D.   On: 02/23/2020 10:24      Flora Lipps, MD  [facility] 02/25/2020  If 7PM-7AM, please contact night-coverage

## 2020-02-25 NOTE — Progress Notes (Signed)
Subjective:    Patient laying in bed moaning, unable to respond to questioning.   Objective:  PE: VITALS:   Vitals:   02/24/20 0916 02/24/20 1415 02/24/20 2028 02/24/20 2034  BP:  111/78 (!) 130/93   Pulse: 92 89 92   Resp: (!) 22 19 20    Temp:  98.4 F (36.9 C) 97.8 F (36.6 C)   TempSrc:   Axillary   SpO2:  95% 99% 99%   General: laying in bed, moaning at movement or when taking off bandage MSK: bandage removed, extensive ecchymosis around the incision site, but incision is well approximated with no drainage. No significant edema to the shoulder. Patient does not grimace or indicate pain to palpation of the shoulder or arm, does grimace and moan when removing adhesive bandage. Extensive ecchymosis at right upper back and extending completely down the right arm. No edema of the upper arm or forearm. Able to move at wrist and fingers, unable to assess sensation. + radial pulse.   LABS  Results for orders placed or performed during the hospital encounter of 02/23/20 (from the past 24 hour(s))  MRSA PCR Screening     Status: None   Collection Time: 02/24/20  4:35 PM   Specimen: Nasal Mucosa; Nasopharyngeal  Result Value Ref Range   MRSA by PCR NEGATIVE NEGATIVE   *Note: Due to a large number of results and/or encounters for the requested time period, some results have not been displayed. A complete set of results can be found in Results Review.    EEG  Result Date: 02/24/2020 Lora Havens, MD     02/24/2020  8:53 AM Patient Name: Ebony Scott MRN: [id number] Epilepsy Attending: Lora Havens Referring Physician/Provider: Delia Heady, PA Date: 02/23/2019 Duration: 23.43 minutes Patient history: 70 year old female with altered mental status. EEG to evaluate for seizures. Level of alertness: Awake AEDs during EEG study: Gabapentin Technical aspects: This EEG study was done with scalp electrodes positioned according to the 10-20 International system of electrode placement.  Electrical activity was acquired at a sampling rate of 500Hz  and reviewed with a high frequency filter of 70Hz  and a low frequency filter of 1Hz . EEG data were recorded continuously and digitally stored. Description: No clear posterior dominant rhythm was seen. EEG showed continuous generalized 3 to 5 Hz theta-delta slowing. Triphasic waves, generalized, maximal bifrontal were noted. Hyperventilation and photic stimulation were not performed.   ABNORMALITY -Continuous slow, generalized -Triphasic waves, generalized IMPRESSION: This study is suggestive of moderate diffuse encephalopathy, nonspecific etiology but could be secondary to toxic, metabolic causes. No seizures or definite epileptiform discharges were seen throughout the recording. Lora Havens   CT HEAD WO CONTRAST  Result Date: 02/23/2020 CLINICAL DATA:  Trauma.  Fall. EXAM: CT HEAD WITHOUT CONTRAST CT CERVICAL SPINE WITHOUT CONTRAST TECHNIQUE: Multidetector CT imaging of the head and cervical spine was performed following the standard protocol without intravenous contrast. Multiplanar CT image reconstructions of the cervical spine were also generated. COMPARISON:  Radiographs October 09, 2013. CT head March 06, 2018. FINDINGS: CT HEAD FINDINGS Brain: No evidence of acute large vascular territory infarction, hemorrhage, hydrocephalus, extra-axial collection or mass lesion/mass effect. Patchy white matter hypoattenuation, most likely related to chronic microvascular ischemic disease. Mild generalized cerebral volume loss with ex vacuo ventricular dilation. Vascular: Calcific atherosclerosis. Skull: No acute fracture. Sinuses/Orbits: Ethmoid air cell mucosal thickening without air-fluid levels. Unremarkable orbits. Other: No mastoid effusions. CT CERVICAL SPINE FINDINGS Nondiagnostic evaluation at the craniocervical junction  secondary to motion. Within this limitation: Alignment: There is approximately 5 mm of anterolisthesis of C4 on C5, favored  chronic/degenerative given anterolisthesis on prior radiographs from October 09, 2013. Otherwise, no substantial sagittal subluxation. Skull base and vertebrae: Osseous fusion of the C6-C7 vertebral bodies. ACDF spanning C3-C4. No evidence of acute fracture. Vertebral body heights are maintained. Post cerclage wires at C6-C7. Soft tissues and spinal canal: No visible prevertebral fluid or swelling. No visible canal hematoma, although evaluation of the canal is limited by motion. Disc levels: Multilevel facet hypertrophy. Multilevel mild degenerative disc disease. Upper chest: Negative. IMPRESSION: 1. No evidence of acute intracranial abnormality. 2. No evidence of acute cervical spine fracture, although evaluation at the craniocervical junction is nondiagnostic secondary to motion. 3. Approximately 5 mm of anterolisthesis of C4 on C5, favored chronic/degenerative given anterolisthesis on prior radiographs from October 09, 2013. Electronically Signed   By: Feliberto Harts MD   On: 02/23/2020 10:57   CT CERVICAL SPINE WO CONTRAST  Result Date: 02/23/2020 CLINICAL DATA:  Trauma.  Fall. EXAM: CT HEAD WITHOUT CONTRAST CT CERVICAL SPINE WITHOUT CONTRAST TECHNIQUE: Multidetector CT imaging of the head and cervical spine was performed following the standard protocol without intravenous contrast. Multiplanar CT image reconstructions of the cervical spine were also generated. COMPARISON:  Radiographs October 09, 2013. CT head March 06, 2018. FINDINGS: CT HEAD FINDINGS Brain: No evidence of acute large vascular territory infarction, hemorrhage, hydrocephalus, extra-axial collection or mass lesion/mass effect. Patchy white matter hypoattenuation, most likely related to chronic microvascular ischemic disease. Mild generalized cerebral volume loss with ex vacuo ventricular dilation. Vascular: Calcific atherosclerosis. Skull: No acute fracture. Sinuses/Orbits: Ethmoid air cell mucosal thickening without air-fluid levels.  Unremarkable orbits. Other: No mastoid effusions. CT CERVICAL SPINE FINDINGS Nondiagnostic evaluation at the craniocervical junction secondary to motion. Within this limitation: Alignment: There is approximately 5 mm of anterolisthesis of C4 on C5, favored chronic/degenerative given anterolisthesis on prior radiographs from October 09, 2013. Otherwise, no substantial sagittal subluxation. Skull base and vertebrae: Osseous fusion of the C6-C7 vertebral bodies. ACDF spanning C3-C4. No evidence of acute fracture. Vertebral body heights are maintained. Post cerclage wires at C6-C7. Soft tissues and spinal canal: No visible prevertebral fluid or swelling. No visible canal hematoma, although evaluation of the canal is limited by motion. Disc levels: Multilevel facet hypertrophy. Multilevel mild degenerative disc disease. Upper chest: Negative. IMPRESSION: 1. No evidence of acute intracranial abnormality. 2. No evidence of acute cervical spine fracture, although evaluation at the craniocervical junction is nondiagnostic secondary to motion. 3. Approximately 5 mm of anterolisthesis of C4 on C5, favored chronic/degenerative given anterolisthesis on prior radiographs from October 09, 2013. Electronically Signed   By: Feliberto Harts MD   On: 02/23/2020 10:57   DG Pelvis Portable  Result Date: 02/23/2020 CLINICAL DATA:  Unwitnessed fall EXAM: PORTABLE PELVIS 1-2 VIEWS COMPARISON:  None. FINDINGS: There is no evidence of pelvic fracture or dislocation. Mild symmetric narrowing of each hip joint noted. Degenerative change noted in visualized lower lumbar spine. No erosive change. IMPRESSION: Mild narrowing each hip joint.  No fracture or dislocation. Electronically Signed   By: Bretta Bang III M.D.   On: 02/23/2020 10:21   DG CHEST PORT 1 VIEW  Result Date: 02/24/2020 CLINICAL DATA:  Systemic inflammatory response syndrome EXAM: PORTABLE CHEST 1 VIEW COMPARISON:  02/23/2020 FINDINGS: Stable mild elevation of the  right hemidiaphragm. Mild bibasilar atelectasis. No pneumothorax or pleural effusion. Cardiac size is within normal limits. No acute bone abnormality. Right  total shoulder arthroplasty and cervical cerclage wire are noted. Multiple surgical clips surrounding and area of dystrophic calcification within the left breast. IMPRESSION: Mild bibasilar atelectasis.  No interval change. Electronically Signed   By: Fidela Salisbury MD   On: 02/24/2020 04:56   DG Chest Port 1 View  Result Date: 02/23/2020 CLINICAL DATA:  Trauma.  Found on floor. EXAM: PORTABLE CHEST 1 VIEW COMPARISON:  02/18/2020 FINDINGS: Cardiac and mediastinal contours normal. Vascularity normal. Mild atelectasis in the bases. No effusion. Apical pleural thickening bilaterally. Right shoulder replacement.  Prior cervical spine for surgery. IMPRESSION: Mild bibasilar atelectasis. Electronically Signed   By: Franchot Gallo M.D.   On: 02/23/2020 10:21   DG Shoulder Right Port  Result Date: 02/23/2020 CLINICAL DATA:  Pain following fall EXAM: PORTABLE RIGHT SHOULDER: 2 + V COMPARISON:  February 16, 2020. FINDINGS: Oblique, frontal, and Y scapular images were obtained. 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 No acute fracture or evident dislocation. Extensive bursal calcification laterally appear stable. No erosion. Visualized right lung clear. IMPRESSION: Total shoulder replacement with prosthetic components appearing well-seated. No acute fracture or dislocation. Extensive bursal calcification is stable. Electronically Signed   By: Lowella Grip III M.D.   On: 02/23/2020 10:24    Assessment/Plan: R reverse shoulder arthroplasty 02/16/20 - NWB RUE, maintain shoulder sling at all times - dressing can be changed as needed if drainage is noted, dressing was last changed this morning - no hematoma seen on exam, shoulder or arm does not look to be infected but more extensive bruising from Xarelto use  post operatively - will continue to follow while inpatient   Contact information:   Weekdays 8-5 Merlene Pulling, PA-C [phone] A fter hours and holidays please check [email or web address] for group call information for [facility]  Ventura Bruns 02/25/2020, 8:28 AM

## 2020-02-25 NOTE — Consult Note (Signed)
Neurology Consultation Reason for Consult: ams Referring Physician: Dr Joycelyn Das  CC: ams  History is obtained from:chart review  HPI: Ebony Scott is a 70 y.o. female with past medical history of DVT on Xarelto asthma tracheobronchomalacia on CPAP, chronic diastolic congestive heart failure, CKD stage II, osteoporosis, rheumatoid arthritis on steroids, breast cancer in remission, recent right shoulder arthroplasty on 02/16/2020 and discharged on 02/19/2020 who presented with altered mental status on 02/23/2020.  Per review of medicine note, patient's daughter talked to her on phone on 02/22/2020 around 5:30 PM and reported that patient's speech was somewhat slurred.  Next day when she went to her house patient was lying in the couch, unresponsive.   Work-up so far is as follows Normal anomia, normal TSH, elevated B12, normal folate, negative alcohol, elevated CK at 468, no evidence of UTI, UDS positive for opioids, mild leukocytosis with WBC of 12.5 which has since resolved, normal sodium, BUN is improving at 29 with creatinine of 1.5, AST 63 with ALT 89, total bilirubin 1.9, blood cultures negative, Covid negative.  CT head without contrast 02/23/2020: No acute abnormality. Chest x-ray 02/24/2020: Mild bibasilar atelectasis  Neurology was consulted for further evaluation of altered mental status.  ROS:  Unable to obtain due to altered mental status.   Past Medical History:  Diagnosis Date  . Acute deep vein thrombosis (DVT) of right lower extremity (HCC) 12/13/2017  . Allergic rhinitis   . Allergy    SEASONAL  . Anemia   . Anxiety    pt denies  . Arthritis    Phreesia 10/21/2019  . Asthma   . Asthma    Phreesia 10/21/2019  . Breast cancer (HCC) 1998   in remission  . Cataract    REMOVED  . Clostridium difficile colitis 01/14/2018  . Clostridium difficile diarrhea 01/14/2018  . Complication of anesthesia    "had hard time waking up from it several times" (02/20/2012)  .  Depression    "some; don't take anything for it" (02/20/2012)  . Diverticulosis   . DVT (deep venous thrombosis) (HCC)   . Exertional dyspnea   . Fibromyalgia 11/2011  . GERD (gastroesophageal reflux disease)   . Graves disease   . Headache(784.0)    "related to allergies; more at different times during the year" (02/20/2012)  . Hemorrhoids   . Hiatal hernia    back and neck  . Hx of adenomatous colonic polyps 04/12/2016  . Hypercholesteremia    good cholesterol is high  . Hypothyroidism   . IBS (irritable bowel syndrome)   . Moderate persistent asthma    -FeV1 72% 2011, -IgE 102 2011, CT sinus Neg 2011  . Osteoporosis    on reclast yearly  . Peripheral vascular disease (HCC) 2019   DVTs  . Pneumonia 04/2011; ~ 11/2011   "double; single" (02/20/2012)  . Seronegative rheumatoid arthritis (HCC)    Dr. Casimer Lanius  . SIRS (systemic inflammatory response syndrome) (HCC) 02/10/2018  . Tracheobronchomalacia     Family History  Problem Relation Age of Onset  . Allergies Mother   . Heart disease Mother   . Arthritis Mother   . Lung cancer Mother   . Diabetes Mother   . Allergies Father   . Heart disease Father   . Arthritis Father   . Stroke Father   . Colon cancer Other        Maternal half aunt/Maternal half uncle  . Colitis Daughter   . Diabetes Maternal Grandfather  Social History:  reports that she is a non-smoker but has been exposed to tobacco smoke. She has never used smokeless tobacco. She reports previous alcohol use. She reports that she does not use drugs.  Exam: Current vital signs: BP (!) 162/96   Pulse 87   Temp 98.9 F (37.2 C) (Oral)   Resp 14   SpO2 91%  Vital signs in last 24 hours: Temp:  [97.8 F (36.6 C)-98.9 F (37.2 C)] 98.9 F (37.2 C) (01/06 1202) Pulse Rate:  [87-92] 87 (01/06 1202) Resp:  [14-20] 14 (01/06 1202) BP: (111-162)/(78-96) 162/96 (01/06 1202) SpO2:  [91 %-99 %] 91 % (01/06 1202)   Physical Exam  Constitutional: Appears  well-developed and well-nourished.  Psych: Affect appropriate to situation Eyes: No scleral injection HENT: No OP obstrucion Head: Normocephalic.  Cardiovascular: Normal rate and regular rhythm.  Respiratory: Effort normal, non-labored breathing GI: Soft.  No distension. There is no tenderness.  Skin: Warm Neuro: just received ativan prior to my exam for mri, doesn't open eyes to noxious stimulation, doesn't follow commands, PERLA. No forced gaze, no facial asymmetry, moving all extremities spontaneously  I have reviewed labs in epic and the results pertinent to this consultation are: Normal anomia, normal TSH, elevated B12, normal folate, negative alcohol, elevated CK at 468, no evidence of UTI, UDS positive for opioids, mild leukocytosis with WBC of 12.5 which has since resolved, normal sodium, BUN is improving at 29 with creatinine of 1.5, AST 63 with ALT 89, total bilirubin 1.9, blood cultures negative, Covid negative.  I have reviewed the images obtained: MR brain wo contrast 02/25/2020: No acute intracranial process. CT head without contrast 02/23/2020: No acute abnormality. Chest x-ray 02/24/2020: Mild bibasilar atelectasis.  ASSESSMENT/PLAN: 70 year old female with recent right shoulder arthroplasty was brought in with altered mental status.  Acute encephalopathy, suspected medication induced -No evidence of infection, normal electrolytes, EEG did not show any evidence of epileptogenicity, CT head did not show any acute abnormality. -Encephalopathy most likely due to medications in the setting of recent surgery in an elderly female  Recommendations: -We will reduce baclofen to 10mg  once daily and reduce gabapentin to 100mg  BID - Son reports patient has had changes in prednisone dose, will review charts more to find out the exact timing of dose change to see if its contributing - Likely due to CKD, patient may require longer to clear the medications - If continues to be altered after  next few days/ by Monday, we can consider LP to look for inflammatory etiology due to patient's history of RA although less likely -Delirium precautions -Management of rest of comorbidities per primary team  Thank you for allowing [place] to participate in the care of this patient.  Neurology will follow.  Please call us if you have any further questions.  I have spent a total of  120 minutes with the patient reviewing hospital notes,  test results, labs and examining the patient as well as establishing an assessment and plan that was discussed personally with the patient's son at bedside, RN and Dr Louanne Belton.  > 50% of time was spent in direct patient care.    Zeb Comfort Epilepsy [facility]

## 2020-02-25 NOTE — Progress Notes (Signed)
PT Cancellation Note  Patient Details Name: [name] MRN: [id number] DOB: [birth year]   Cancelled Treatment:    Reason Eval/Treat Not Completed: Patient at procedure or test/unavailable currently at MRI, also received Ativan earlier this morning. Will continue efforts as time/schedule allow.    Madelaine Etienne, DPT, PN1   Supplemental Physical Therapist [facility]    Pager [phone] Acute Rehab Office [phone]

## 2020-02-26 DIAGNOSIS — G9341 Metabolic encephalopathy: Secondary | ICD-10-CM | POA: Diagnosis not present

## 2020-02-26 DIAGNOSIS — Z853 Personal history of malignant neoplasm of breast: Secondary | ICD-10-CM | POA: Diagnosis not present

## 2020-02-26 DIAGNOSIS — E782 Mixed hyperlipidemia: Secondary | ICD-10-CM | POA: Diagnosis not present

## 2020-02-26 DIAGNOSIS — M542 Cervicalgia: Secondary | ICD-10-CM | POA: Diagnosis not present

## 2020-02-26 DIAGNOSIS — K219 Gastro-esophageal reflux disease without esophagitis: Secondary | ICD-10-CM | POA: Diagnosis not present

## 2020-02-26 LAB — BASIC METABOLIC PANEL
Anion gap: 14 (ref 5–15)
BUN: 13 mg/dL (ref 8–23)
CO2: 18 mmol/L — ABNORMAL LOW (ref 22–32)
Calcium: 8 mg/dL — ABNORMAL LOW (ref 8.9–10.3)
Chloride: 115 mmol/L — ABNORMAL HIGH (ref 98–111)
Creatinine, Ser: 1.03 mg/dL — ABNORMAL HIGH (ref 0.44–1.00)
GFR, Estimated: 58 mL/min — ABNORMAL LOW (ref >60)
Glucose, Bld: 125 mg/dL — ABNORMAL HIGH (ref 70–99)
Potassium: 3.7 mmol/L (ref 3.5–5.1)
Sodium: 147 mmol/L — ABNORMAL HIGH (ref 135–145)

## 2020-02-26 LAB — CBC
HCT: 30.6 % — ABNORMAL LOW (ref 36.0–46.0)
Hemoglobin: 9.4 g/dL — ABNORMAL LOW (ref 12.0–15.0)
MCH: 34.1 pg — ABNORMAL HIGH (ref 26.0–34.0)
MCHC: 30.7 g/dL (ref 30.0–36.0)
MCV: 110.9 fL — ABNORMAL HIGH (ref 80.0–100.0)
Platelets: 196 10*3/uL (ref 150–400)
RBC: 2.76 MIL/uL — ABNORMAL LOW (ref 3.87–5.11)
RDW: 20.3 % — ABNORMAL HIGH (ref 11.5–15.5)
WBC: 13.1 10*3/uL — ABNORMAL HIGH (ref 4.0–10.5)
nRBC: 0.9 % — ABNORMAL HIGH (ref 0.0–0.2)

## 2020-02-26 LAB — MAGNESIUM: Magnesium: 2 mg/dL (ref 1.7–2.4)

## 2020-02-26 LAB — PHOSPHORUS: Phosphorus: 2.2 mg/dL — ABNORMAL LOW (ref 2.5–4.6)

## 2020-02-26 MED ORDER — DEXTROSE-NACL 5-0.45 % IV SOLN: INTRAVENOUS | Status: AC

## 2020-02-26 MED ORDER — LIDOCAINE 5 % EX PTCH
1.0000 | MEDICATED_PATCH | Freq: Every day | CUTANEOUS | Status: DC
  Administered 2020-02-26 – 2020-02-29 (×4): 1 via TRANSDERMAL
  Filled 2020-02-26 (×4): qty 1

## 2020-02-26 MED ORDER — POTASSIUM PHOSPHATES 15 MMOLE/5ML IV SOLN
30.0000 mmol | Freq: Once | INTRAVENOUS | Status: AC
  Administered 2020-02-26: 30 mmol via INTRAVENOUS
  Filled 2020-02-26: qty 10

## 2020-02-26 MED ORDER — PREDNISONE 5 MG PO TABS
15.0000 mg | ORAL_TABLET | Freq: Every day | ORAL | Status: DC
  Administered 2020-02-27 – 2020-02-29 (×3): 15 mg via ORAL
  Filled 2020-02-26 (×3): qty 1

## 2020-02-26 NOTE — Progress Notes (Signed)
Orthopedic Tech Progress Note Patient Details:  Ebony Scott [birth year] [id number] Spoke with RN. Patient has SLING. Patient ID: [name], female   DOB: 09-Apr-1950, 70 y.o.   MRN: [id number]   Janit Pagan 02/26/2020, 10:49 AM

## 2020-02-26 NOTE — Progress Notes (Signed)
Phone call from daughter feels that Baclofen that was new medicine after surgery may have been what may have affected her mother.

## 2020-02-26 NOTE — Progress Notes (Signed)
Occupational Therapy Evaluation Patient Details Name: Ebony Scott MRN: [id number] DOB: [birth year] Today's Date: 02/26/2020    History of Present Illness 70yo female s/p R reverse shoulder arthroplasty 02/16/20, now presenting with AMS. Last seen normal 1/3, found on the morning of 1/4 by her daughter and was unresponsive. CTH negative for acute changes, imaging otherwise clear of acute injury. Admitted with acute metabolic encepthalopathy. PMH hx DVT, severe tracheobronchial malacia, CHF, CKD, osteoporosis, RA, breast CA in remission, fibromyalgia, PVD, cervical surgery   Clinical Impression   Pt recently s/p R reverse TSA. Prior to that, independent and lived alone. Pt essentially nonverbal during session, primarily moaning most of time, however calmed after sitting for @ 5 min and able to briefly sustain attention to say her name and answer a question. Eyes opened briefly. Max A with bed mobility, Mod A +2 to stand briefly and total A for ADL tasks at this time due to deficits listed below. Recommend SNF for rehab. Will follow acutely.  Staff educated on R reverse TSA protocol and information posted above bed. Pt needs to have pillow behind R shoulder when supine and follow precautions as noted below. Recommend lights on during the day, blinds open, chair position at times during the day to reduce hospital delirium.    Follow Up Recommendations  SNF;Supervision/Assistance - 24 hour    Equipment Recommendations  Other (comment) (will further assess)    Recommendations for Other Services       Precautions / Restrictions Precautions Precautions: Shoulder Type of Shoulder Precautions: No AROM, NO PROM of R shoulder; elbow/wrist/hand AROM OK Shoulder Interventions: Shoulder sling/immobilizer;At all times;Off for dressing/bathing/exercises Precaution Booklet Issued: No Required Braces or Orthoses: Sling Restrictions Weight Bearing Restrictions: Yes RUE Weight Bearing: Non weight bearing       Mobility Bed Mobility Overal bed mobility: Needs Assistance Bed Mobility: Rolling;Sidelying to Sit;Sit to Supine Rolling: Mod assist Sidelying to sit: Max assist;+2 for physical assistance;HOB elevated Supine to sit: Max assist Sit to supine: Total assist;HOB elevated   General bed mobility comments: Max A of 2 to roll towards left side, bring LEs off bed and elevate trunk to get to EOB. Assist of 2 to return to supine, positioned pillow under right shoulder.    Transfers Overall transfer level: Needs assistance Equipment used: 2 person hand held assist Transfers: Sit to/from Stand Sit to Stand: Mod assist;+2 physical assistance         General transfer comment: Assist of 2 to stand from EOB with assist for anterior translation; bil knees flexed. Not able to pick up feet to side step along side bed despite assist for weight shifting/verbal cues.    Balance Overall balance assessment: Needs assistance;History of Falls Sitting-balance support: Feet supported;No upper extremity supported Sitting balance-Leahy Scale: Poor Sitting balance - Comments: Min A progressing to moments of Min guard for sitting balance; adjusted sling for proper fit. Able to initiate washing face sitting EOB     Standing balance-Leahy Scale: Poor Standing balance comment: External support to stand from EOB.                           ADL either performed or assessed with clinical judgement   ADL                                         General ADL  Comments: total A at this time     Vision   Additional Comments: eyes closed majority of session     Perception     Praxis      Pertinent Vitals/Pain Pain Assessment: Faces Faces Pain Scale: Hurts little more Pain Location: generalized discomfort- moaning at times, unsure if this is related to pain? Pain Descriptors / Indicators: Moaning Pain Intervention(s): Limited activity within patient's tolerance;Repositioned      Hand Dominance Right   Extremity/Trunk Assessment Upper Extremity Assessment Upper Extremity Assessment: RUE deficits/detail;LUE deficits/detail RUE Deficits / Details: Non functional use of RUE due to shoulder precautions and block. Continues to exhibit decreased elbow and forearm ROM. LUE Deficits / Details: generalized weakness   Lower Extremity Assessment Lower Extremity Assessment: Defer to PT evaluation   Cervical / Trunk Assessment Cervical / Trunk Assessment: Kyphotic   Communication Communication Communication: Other (comment) (mostly moaning)   Cognition Arousal/Alertness: Lethargic Behavior During Therapy: Restless;Flat affect Overall Cognitive Status: Impaired/Different from baseline Area of Impairment: Orientation;Attention;Memory;Following commands;Safety/judgement;Awareness;Problem solving                 Orientation Level: Disoriented to;Place;Time;Situation Current Attention Level: Focused Memory: Decreased recall of precautions;Decreased short-term memory Following Commands: Follows one step commands inconsistently Safety/Judgement: Decreased awareness of safety;Decreased awareness of deficits Awareness: Intellectual Problem Solving: Slow processing;Decreased initiation;Difficulty sequencing;Requires verbal cues;Requires tactile cues     General Comments  daughter Joseph Art present for session and assisting as needed    Exercises     Shoulder Instructions      Home Living Family/patient expects to be discharged to:: Private residence Living Arrangements: Alone Available Help at Discharge: Family;Available PRN/intermittently Type of Home: House Home Access: Stairs to enter [employer] of Steps: family does not let her use baseline steps- stays on main floor once inside   Home Layout: Two level;Other (Comment)     Bathroom Shower/Tub: Occupational psychologist: Standard Bathroom Accessibility: Yes   Home Equipment:  Toilet riser;Shower seat;Cane - single point   Additional Comments: daughter works as a Nurse, mental health at [employer] a couple days a week but lives in [place]; sounds like son is local but could not help 24/7 potentially      Prior Functioning/Environment Level of Independence: Independent        Comments: enjoys [employer]; crossword puzzles        OT Problem List: Decreased strength;Decreased range of motion;Decreased activity tolerance;Impaired balance (sitting and/or standing);Impaired vision/perception;Decreased coordination;Decreased cognition;Decreased safety awareness;Decreased knowledge of use of DME or AE;Decreased knowledge of precautions;Obesity;Impaired UE functional use;Pain      OT Treatment/Interventions: Self-care/ADL training;Therapeutic exercise;Energy conservation;DME and/or AE instruction;Therapeutic activities;Cognitive remediation/compensation;Visual/perceptual remediation/compensation;Patient/family education;Balance training    OT Goals(Current goals can be found in the care plan section) Acute Rehab OT Goals Patient Stated Goal: per daughter for her mother to get better OT Goal Formulation: With family Time For Goal Achievement: 03/11/20 Potential to Achieve Goals: Fair  OT Frequency: Min 2X/week   Barriers to D/C:            Co-evaluation PT/OT/SLP Co-Evaluation/Treatment: Yes Reason for Co-Treatment: Complexity of the patient's impairments (multi-system involvement);Necessary to address cognition/behavior during functional activity;To address functional/ADL transfers   OT goals addressed during session: ADL's and self-care      AM-PAC OT "6 Clicks" Daily Activity     Outcome Measure Help from another person eating meals?: Total Help from another person taking care of personal grooming?: Total Help from another person toileting, which includes using  toliet, bedpan, or urinal?: Total Help from another person bathing (including washing, rinsing,  drying)?: Total Help from another person to put on and taking off regular upper body clothing?: Total Help from another person to put on and taking off regular lower body clothing?: Total 6 Click Score: 6   End of Session Nurse Communication: Mobility status;Precautions;Weight bearing status  Activity Tolerance: Patient limited by lethargy Patient left: in bed;with call bell/phone within reach;with bed alarm set;with family/visitor present;with nursing/sitter in room  OT Visit Diagnosis: Unsteadiness on feet (R26.81);Other abnormalities of gait and mobility (R26.89);Muscle weakness (generalized) (M62.81);Other symptoms and signs involving cognitive function;Pain Pain - Right/Left: Right Pain - part of body: Shoulder (generalized)                Time: [id number] OT Time Calculation (min): 32 min Charges:  OT General Charges $OT Visit: 1 Visit OT Evaluation $OT Eval Moderate Complexity: 1 Mod OT Treatments $Self Care/Home Management : 8-22 mins  Maurie Boettcher, OT/L   Acute OT Clinical Specialist [place] Pager [phone] Office [phone]   [facility] 02/26/2020, 5:36 PM

## 2020-02-26 NOTE — Progress Notes (Signed)
Subjective: No acute events overnight.  ROS: Unable to obtain due to poor mental status  Examination  Vital signs in last 24 hours: Temp:  [98.2 F (36.8 C)-99.5 F (37.5 C)] 99.5 F (37.5 C) (01/07 0900) Pulse Rate:  [87-98] 98 (01/07 0900) Resp:  [14-18] 16 (01/07 0900) BP: (144-162)/(75-96) 144/78 (01/07 0900) SpO2:  [91 %-98 %] 98 % (01/07 0900)  General: lying in bed, not in apparent distress CVS: pulse-normal rate and rhythm RS: breathing comfortably, coarse breath sounds bilaterally Extremities: normal, warm Neuro: Awake, alert, able to look at me, does not follow commands, mumbling and repeating "okay", unable to tell me her name, PERRLA, extraocular movements appear intact, no apparent facial asymmetry, moving all 4 extremities spontaneously  Basic Metabolic Panel: Recent Labs  Lab 02/23/20 0944 02/23/20 1018 02/23/20 1942 02/24/20 0517 02/26/20 0732  NA 138 136 139 141 147*  K 4.0 3.4* 3.3* 3.9 3.7  CL 96* 101  --  106 115*  CO2 24  --   --  19* 18*  GLUCOSE 187* 174*  --  138* 125*  BUN 48* 44*  --  29* 13  CREATININE 1.52* 1.40*  --  1.25* 1.03*  CALCIUM 9.3  --   --  8.6* 8.0*  MG  --   --   --   --  2.0  PHOS  --   --   --   --  2.2*    CBC: Recent Labs  Lab 02/23/20 0944 02/23/20 1018 02/23/20 1942 02/23/20 2326 02/24/20 0517 02/26/20 0732  WBC 12.5*  --   --  9.7 10.4 13.1*  NEUTROABS  --   --   --  7.8*  --   --   HGB 10.6* 11.2* 8.8* 9.4* 11.2* 9.4*  HCT 32.6* 33.0* 26.0* 28.7* 35.5* 30.6*  MCV 107.6*  --   --  107.9* 110.9* 110.9*  PLT 198  --   --  182 170 196     Coagulation Studies: Recent Labs    02/23/20 2326  LABPROT 27.8*  INR 2.7*    Imaging No new brain imaging overnight  ASSESSMENT AND PLAN: 70 year old female with recent right shoulder arthroplasty was brought in with altered mental status.  Acute encephalopathy, suspected medication induced -No evidence of infection, normal electrolytes, EEG did not show any  evidence of epileptogenicity, CT head did not show any acute abnormality. -Encephalopathy most likely due to medications in the setting of recent surgery in an elderly female  Recommendations: -Dr. Louanne Belton stopped baclofen and gabapentin, will slowly resume as patient is more alert and oriented - Likely due to CKD, patient may require longer to clear the medications - If continues to be altered after next few days/ by Monday, we can consider LP to look for inflammatory etiology due to patient's history of RA although less likely -Delirium precautions -Management of rest of comorbidities per primary team  Thank you for allowing [place] to participate in the care of this patient.  Neurology will follow.  Please call us if you have any further questions.  I have spent a total of25 minuteswith the patient reviewing hospitalnotes,  test results, labs and examining the patient as well as establishing an assessment and plan.>50% of time was spent in direct patient care.    Zeb Comfort Epile[facility] For questions after 5pm please refer to AMION to reach the Neurologist on call

## 2020-02-26 NOTE — Progress Notes (Signed)
PROGRESS NOTE  Ebony Scott [id number] DOB: 02/29/1950 DOA: 02/23/2020 PCP: Ebony Frizzle, MD   LOS: 2 days   Brief narrative:  Ebony Scott is a 70 y.o. female with medical history significant of DVT on Xarelto, mild intermittent asthma, severe tracheobronchomalacia on at bedtime CPAP, chronic diastolic CHF, CKD stage II, osteoporosis, rheumatoid arthritis on chronic steroid, breast cancer in remission, recent right shoulder arthroplasty on 02/19/2020 who presented to the hospital with altered mental status.  Patient was very confused.  She does have sensitivity to narcotics in the past, but was on tramadol after sugery.  When daughter checked in her house on the morning of presentation, patient was found lying in the couch with her right shoulder on ice pack, unresponsive.  Had not been using her CPAP at nighttime as well.  In the ED, CT head and neck was negative for acute findings.  UA was negative for UTI.  Chest x-ray was negative for pneumonia.  Patient remained lethargic in the ED. Patient was then admitted to hospital for further evaluation and treatment.    Assessment/Plan:  Principal Problem:   Metabolic encephalopathy Active Problems:   Hyperlipidemia   NECK PAIN, CHRONIC   LPRD (laryngopharyngeal reflux disease)   History of breast cancer   S/P reverse total shoulder arthroplasty, right  Altered mental status likely acute metabolic encephalopathy   Still encephalopathic..  CT head scan and C-spine negative for acute findings.  Folic acid at [id number].  Vitamin B12 within normal range.  TSH 1.5.  UA was negative for infection.  Chest x-ray unchanged and no overt signs of infection.  Ammonia level was 19 and within normal limits.  Venous blood gas was negative for hypercarbia x2  . EEG with metabolic encephalopathy.  Unclear etiology of encephalopathy at this time but are thought to be secondary to sedatives including tramadol.Marland Kitchen   MRI of the brain without acute findings.  Seen by  neurology yesterday.  Doses of sedatives were decreased yesterday.  We will completely hold them off today.  Blood cultures negative in 2 days.  History of intermittent asthma, tracheobronchomalacia.  We will continue CPAP as tolerated.  VBG repeat on 1/6 noted without hypercapnia.  No active wheezing.  Continue nebulizers.  CKD stage IIIa Will closely monitor BMP. Creatinine of 1.25.  Rheumatoid arthritis on chronic steroid.  Will change to IV if unable to to take p.o.  Right shoulder arthroplasty on 02/19/2020.  With extensive old bruise on the arm.  No tense hematoma or signs of infection. Hemoglobin stable.   Seen by orthopedics.  On Xarelto, will continue for now.  Unsure if she is in pain.  Unable to use NSAIDs listed as allergy.  We will add a Lidoderm patch, ice compression and arm sling.  History of DVT and PE. On xarelto as outpatient.  Likely on continued Xarelto secondary to history of breast cancer with PE.  Chest x-ray with bibasilar atelectasis.   Afebrile, no leukocytosis.  No symptoms.  No need for antibiotics.   Lactate was 1.3.  Blood cultures negative in 1 day  Hypokalemia.  Has improved.  Magnesium of 2.0.  Check BMP in a.m.  Hypernatremia.  We will continue with IV fluid hydration.  Change D5 normal saline to D5 half normal saline.  Check BMP in a.m.  Mild hypophosphatemia.  Will replenish with IV K-Phos today  DVT prophylaxis: rivaroxaban (XARELTO) tablet 20 mg Start: 02/23/20 1800 rivaroxaban (XARELTO) tablet 20 mg   Code Status:  Full code  Family Communication:  I again spoke with the patient's daughter Ebony Scott at bedside and updated her about the clinical condition of the patient.  Status is: Inpatient  The patient is inpatient because: IV treatments appropriate due to intensity of illness or inability to take PO and Inpatient level of care appropriate due to severity of illness, ongoing encephalopathy, unsafe disposition  Dispo: The patient is from:  Home              Anticipated d/c is to: Home with home health/undetermined.  Will get PT evaluation, pending, follow neurology recommendations.              Anticipated d/c date is: 2 days              Patient currently is not medically stable to d/c.  Consultants:  Orthopedics   Neurology  Procedures:  CPAP  Antibiotics:  . None   Subjective: Today, patient was seen and examined at bedside.  No significant improvement in mentation.  Still moaning without meaningful response.  Objective: Vitals:   02/25/20 1950 02/26/20 0401  BP: (!) 152/75 (!) 155/78  Pulse: 97 96  Resp: 14 16  Temp: 98.2 F (36.8 C) 99 F (37.2 C)  SpO2: 96% 93%    Intake/Output Summary (Last 24 hours) at 02/26/2020 [id number] Last data filed at 02/25/2020 1952 Gross per 24 hour  Intake 0 ml  Output 0 ml  Net 0 ml   There were no vitals filed for this visit. There is no height or weight on file to calculate BMI.   Physical Exam:  GENERAL: Somnolent, moaning, no meaningful conversation. HENT: No scleral pallor or icterus.  Bilateral dilated pupils.  Patient closing eyes.  Left cheek with mild abrasion. NECK: is supple, no gross swelling noted.  No neck stiffness. CHEST: Clear to auscultation. No crackles or wheezes.  Diminished breath sounds bilaterally. CVS: S1 and S2 heard, no murmur. Regular rate and rhythm.  ABDOMEN: Soft, non-tender, bowel sounds are present. EXTREMITIES: No edema.  Right shoulder with recent surgery and extensive light bruise over the right mammary area and upper extremity. CNS: Somnolent, moving extremities, moaning SKIN: warm and dry without rashes.  Extensive bruising over the chest wall,arm and forearm which appears to resolving.  Data Review: I have personally reviewed the following laboratory data and studies,  CBC: Recent Labs  Lab 02/19/20 0817 02/23/20 0944 02/23/20 1018 02/23/20 1942 02/23/20 2326 02/24/20 0517  WBC 11.5* 12.5*  --   --  9.7 10.4  NEUTROABS   --   --   --   --  7.8*  --   HGB 9.8* 10.6* 11.2* 8.8* 9.4* 11.2*  HCT 30.5* 32.6* 33.0* 26.0* 28.7* 35.5*  MCV 106.6* 107.6*  --   --  107.9* 110.9*  PLT 143* 198  --   --  182 [id number]   Basic Metabolic Panel: Recent Labs  Lab 02/19/20 0817 02/19/20 0844 02/23/20 0944 02/23/20 1018 02/23/20 1942 02/24/20 0517  NA 139  --  138 136 139 141  K 3.6  --  4.0 3.4* 3.3* 3.9  CL 103  --  96* 101  --  106  CO2 25  --  24  --   --  19*  GLUCOSE 173*  --  187* 174*  --  138*  BUN 31*  --  48* 44*  --  29*  CREATININE 1.26*  --  1.52* 1.40*  --  1.25*  CALCIUM 8.1*  --  9.3  --   --  8.6*  MG  --  1.8  --   --   --   --    Liver Function Tests: Recent Labs  Lab 02/19/20 0817 02/23/20 0944 02/24/20 0517  AST 56* 67* 63*  ALT 86* 115* 89*  ALKPHOS 39 71 67  BILITOT 1.0 1.5* 1.9*  PROT 5.6* 6.1* 5.4*  ALBUMIN 3.1* 2.9* 2.4*   No results for input(s): LIPASE, AMYLASE in the last 168 hours. Recent Labs  Lab 02/23/20 1242  AMMONIA 19   Cardiac Enzymes: Recent Labs  Lab 02/23/20 1234  CKTOTAL 468*   BNP (last 3 results) Recent Labs    05/07/19 0953  BNP 41    ProBNP (last 3 results) No results for input(s): PROBNP in the last 8760 hours.  CBG: No results for input(s): GLUCAP in the last 168 hours. Recent Results (from the past 240 hour(s))  Aerobic/Anaerobic Culture (surgical/deep wound)     Status: None   Collection Time: 02/16/20 11:10 AM   Specimen: PATH Soft tissue  Result Value Ref Range Status   Specimen Description   Final    TISSUE RIGHT HUMERUS Performed at [facility] [address] [place], [place] [address]    Special Requests   Final    NONE Performed at [facility], [place] [address], [place], [place] [address]    Gram Stain   Final    RARE WBC PRESENT,BOTH PMN AND MONONUCLEAR NO ORGANISMS SEEN    Culture   Final    No growth aerobically or anaerobically. Performed at [facility], [place] [address] [place], [place] [address]    Report Status 02/21/2020 FINAL  Final  Resp Panel by RT-PCR (Flu A&B, Covid) Nasopharyngeal Swab     Status: None   Collection Time: 02/23/20 12:08 PM   Specimen: Nasopharyngeal Swab; Nasopharyngeal(NP) swabs in vial transport medium  Result Value Ref Range Status   SARS Coronavirus 2 by RT PCR NEGATIVE NEGATIVE Final    Comment: (NOTE) SARS-CoV-2 target nucleic acids are NOT DETECTED.  The SARS-CoV-2 RNA is generally detectable in upper respiratory specimens during the acute phase of infection. The lowest concentration of SARS-CoV-2 viral copies this assay can detect is 138 copies/mL. A negative result does not preclude SARS-Cov-2 infection and should not be used as the sole basis for treatment or other patient management decisions. A negative result may occur with  improper specimen collection/handling, submission of specimen other than nasopharyngeal swab, presence of viral mutation(s) within the areas targeted by this assay, and inadequate number of viral copies(<138 copies/mL). A negative result must be combined with clinical observations, patient history, and epidemiological information. The expected result is Negative.  Fact Sheet for Patients:  EntrepreneurPulse.com.au  Fact Sheet for Healthcare Providers:  IncredibleEmployment.be  This test is no t yet approved or cleared by the [place] FDA and  has been authorized for detection and/or diagnosis of SARS-CoV-2 by FDA under an Emergency Use Authorization (EUA). This EUA will remain  in effect (meaning this test can be used) for the duration of the COVID-19 declaration under Section 564(b)(1) of the Act, 21 U.S.C.section 360bbb-3(b)(1), unless the authorization is terminated  or revoked sooner.       Influenza A by PCR NEGATIVE NEGATIVE Final   Influenza B by PCR NEGATIVE NEGATIVE Final    Comment: (NOTE) The Xpert Xpress SARS-CoV-2/FLU/RSV plus  assay is intended as an aid in the diagnosis of influenza  from Nasopharyngeal swab specimens and should not be used as a sole basis for treatment. Nasal washings and aspirates are unacceptable for Xpert Xpress SARS-CoV-2/FLU/RSV testing.  Fact Sheet for Patients: EntrepreneurPulse.com.au  Fact Sheet for Healthcare Providers: IncredibleEmployment.be  This test is not yet approved or cleared by the [place] FDA and has been authorized for detection and/or diagnosis of SARS-CoV-2 by FDA under an Emergency Use Authorization (EUA). This EUA will remain in effect (meaning this test can be used) for the duration of the COVID-19 declaration under Section 564(b)(1) of the Act, 21 U.S.C. section 360bbb-3(b)(1), unless the authorization is terminated or revoked.  Performed at [facility], [place] [address], [place], [place] [address]   Culture, blood (routine x 2)     Status: None (Preliminary result)   Collection Time: 02/23/20  2:40 PM   Specimen: BLOOD  Result Value Ref Range Status   Specimen Description BLOOD SITE NOT SPECIFIED  Final   Special Requests   Final    BOTTLES DRAWN AEROBIC AND ANAEROBIC Blood Culture adequate volume   Culture   Final    NO GROWTH 2 DAYS Performed at [facility], [place] [address] [place], [place] [address]    Report Status PENDING  Incomplete  Culture, blood (routine x 2)     Status: None (Preliminary result)   Collection Time: 02/23/20 11:39 PM   Specimen: BLOOD  Result Value Ref Range Status   Specimen Description BLOOD RIGHT ANTECUBITAL  Final   Special Requests   Final    BOTTLES DRAWN AEROBIC AND ANAEROBIC Blood Culture results may not be optimal due to an excessive volume of blood received in culture bottles   Culture   Final    NO GROWTH 1 DAY Performed at [facility], [place] [address], [place], [place] [address]    Report Status PENDING  Incomplete  MRSA PCR Screening     Status: None    Collection Time: 02/24/20  4:35 PM   Specimen: Nasal Mucosa; Nasopharyngeal  Result Value Ref Range Status   MRSA by PCR NEGATIVE NEGATIVE Final    Comment:        The GeneXpert MRSA Assay (FDA approved for NASAL specimens only), is one component of a comprehensive MRSA colonization surveillance program. It is not intended to diagnose MRSA infection nor to guide or monitor treatment for MRSA infections. Performed at [facility], [place] [address], [place],  [address]      Studies: EEG  Result Date: 02/24/2020 Lora Havens, MD     02/24/2020  8:53 AM Patient Name: KEILEE DENMAN MRN: [id number] Epilepsy Attending: Lora Havens Referring Physician/Provider: Delia Heady, PA Date: 02/23/2019 Duration: 23.43 minutes Patient history: 70 year old female with altered mental status. EEG to evaluate for seizures. Level of alertness: Awake AEDs during EEG study: Gabapentin Technical aspects: This EEG study was done with scalp electrodes positioned according to the 10-20 International system of electrode placement. Electrical activity was acquired at a sampling rate of 500Hz  and reviewed with a high frequency filter of 70Hz  and a low frequency filter of 1Hz . EEG data were recorded continuously and digitally stored. Description: No clear posterior dominant rhythm was seen. EEG showed continuous generalized 3 to 5 Hz theta-delta slowing. Triphasic waves, generalized, maximal bifrontal were noted. Hyperventilation and photic stimulation were not performed.   ABNORMALITY -Continuous slow, generalized -Triphasic waves, generalized IMPRESSION: This study is suggestive of moderate diffuse encephalopathy, nonspecific etiology but could be  secondary to toxic, metabolic causes. No seizures or definite epileptiform discharges were seen throughout the recording. Lora Havens   MR BRAIN WO CONTRAST  Result Date: 02/25/2020 CLINICAL DATA:  Delirium EXAM: MRI HEAD WITHOUT CONTRAST TECHNIQUE:  Multiplanar, multiecho pulse sequences of the brain and surrounding structures were obtained without intravenous contrast. COMPARISON:  02/23/2020 and prior. FINDINGS: Examination was terminated early secondary to patient disposition. Please note that motion artifact and limited sequence acquisition limits evaluation. Brain: No focal diffusion-weighted signal abnormality. No intracranial hemorrhage. No midline shift, ventriculomegaly or extra-axial fluid collection. No mass lesion. Mild cerebral atrophy with ex vacuo dilatation. Mild chronic microvascular ischemic changes. Vascular: Not well evaluated on this exam. Skull and upper cervical spine: Normal marrow signal. Sinuses/Orbits: Normal orbits. Clear paranasal sinuses. No mastoid effusion. Other: None. IMPRESSION: No acute intracranial process. Mild cerebral atrophy and chronic microvascular ischemic changes. Please note that limited sequence acquisition and motion artifact limits evaluation. Electronically Signed   By: Primitivo Gauze M.D.   On: 02/25/2020 15:14      Flora Lipps, MD  [facility] 02/26/2020  If 7PM-7AM, please contact night-coverage

## 2020-02-26 NOTE — Consult Note (Incomplete)
   [facility] Inpatient Consult   02/26/2020  [name] [birth year] [id number]  [facility] [ACO] Patient: Ebony Scott PPO   Patient screened for extreme high score for unplanned readmission risk noted and for less than 7 days readmission hospitalization.  Chart reviewed to check for barriers to care,  potential [facility] Management service needs.  Review of patient's medical record reveals patient is currently recommended for a skilled nursing facility stay per PT evaluation.  Primary Care Provider is Jenna Luo, MD  this provider is listed to provide the transition of care [TOC] for post hospital follow up.   Plan:  Will continue to follow progress and disposition, which is currently not finalized,  to assess for post hospital care management needs.    Please place a [facility] Care Management consult as appropriate and for questions contact:   Natividad Brood, RN BSN [facility] Liaison  [phone] business mobile phone Toll free office [phone]  Fax number: [phone] [email or web address]

## 2020-02-26 NOTE — Progress Notes (Signed)
Physical Therapy Treatment Patient Details Name: Ebony Scott MRN: [id number] DOB: Mar 08, 1950 Today's Date: 02/26/2020    History of Present Illness 70yo female s/p R reverse shoulder arthroplasty 02/16/20, now presenting with AMS. Last seen normal 1/3, found on the morning of 1/4 by her daughter and was unresponsive. CTH negative for acute changes, imaging otherwise clear of acute injury. Admitted with acute metabolic encepthalopathy. PMH hx DVT, severe tracheobronchial malacia, CHF, CKD, osteoporosis, RA, breast CA in remission, fibromyalgia, PVD, cervical surgery    PT Comments    Patient continues to be very lethargic and difficult to arouse to participate in therapy services. Able to open eyes for short periods but not sustain. Pt with little to no verbalizations throughout session. Requires Max-total A for bed mobility. Able to sit EOB with Min A with moments of Min guard to perform ADL task. Adjusted sling for proper fit and repositioned pillow under right shoulder upon returning to supine. Able to stand with assist of 2 but not initiate stepping along side the bed despite assist for weight shifting/verbal cues. Will continue to follow and progress as able.    Follow Up Recommendations  SNF;Supervision/Assistance - 24 hour     Equipment Recommendations  Wheelchair (measurements PT);Wheelchair cushion (measurements PT);3in1 (PT)    Recommendations for Other Services       Precautions / Restrictions Precautions Precautions: Shoulder Type of Shoulder Precautions: No AROM, NO PROM Shoulder Interventions: Shoulder sling/immobilizer;At all times;Off for dressing/bathing/exercises Precaution Booklet Issued: No Required Braces or Orthoses: Sling Restrictions Weight Bearing Restrictions: Yes RUE Weight Bearing: Non weight bearing    Mobility  Bed Mobility Overal bed mobility: Needs Assistance Bed Mobility: Rolling;Sidelying to Sit;Sit to Supine Rolling: Mod assist Sidelying to sit:  Max assist;+2 for physical assistance;HOB elevated   Sit to supine: Total assist;HOB elevated   General bed mobility comments: Max A of 2 to roll towards left side, bring LEs off bed and elevate trunk to get to EOB. Assist of 2 to return to supine, positioned pillow under right shoulder.  Transfers Overall transfer level: Needs assistance Equipment used: 2 person hand held assist Transfers: Sit to/from Stand Sit to Stand: Mod assist;+2 physical assistance         General transfer comment: Assist of 2 to stand from EOB with assist for anterior translation; bil knees flexed. Not able to pick up feet to side step along side bed despite assist for weight shifting/verbal cues.  Ambulation/Gait             General Gait Details: Deferred.   Stairs             Wheelchair Mobility    Modified Rankin (Stroke Patients Only)       Balance Overall balance assessment: Needs assistance;History of Falls Sitting-balance support: Feet supported;No upper extremity supported Sitting balance-Leahy Scale: Poor Sitting balance - Comments: Min A progressing to moments of Min guard for sitting balance; adjusted sling for proper fit. Able to initiate washing face sitting EOB     Standing balance-Leahy Scale: Poor Standing balance comment: External support to stand from EOB.                            Cognition Arousal/Alertness: Lethargic Behavior During Therapy: Restless Overall Cognitive Status: Impaired/Different from baseline Area of Impairment: Attention;Memory;Following commands;Safety/judgement;Awareness;Problem solving                   Current Attention Level: Focused Memory:  Decreased recall of precautions;Decreased short-term memory Following Commands: Follows one step commands with increased time;Follows one step commands inconsistently Safety/Judgement: Decreased awareness of safety;Decreased awareness of deficits Awareness: Intellectual Problem  Solving: Slow processing;Decreased initiation;Difficulty sequencing;Requires verbal cues;Requires tactile cues General Comments: Pt with only a few verbalizations during session; able to open eyes with max cues but not sustain. Follows a few commands. States her name is "Ebony Scott" (which daughter says is her nickname).      Exercises      General Comments General comments (skin integrity, edema, etc.): Daughter, Ebony Scott, is present during session.      Pertinent Vitals/Pain Pain Assessment: Faces Faces Pain Scale: Hurts a little bit Pain Location: generalized discomfort- moaning at times, unsure if this is related to pain? Pain Descriptors / Indicators: Moaning Pain Intervention(s): Monitored during session;Repositioned    Home Living                      Prior Function            PT Goals (current goals can now be found in the care plan section) Progress towards PT goals: Progressing toward goals    Frequency    Min 3X/week      PT Plan Current plan remains appropriate    Co-evaluation PT/OT/SLP Co-Evaluation/Treatment: Yes Reason for Co-Treatment: Complexity of the patient's impairments (multi-system involvement);Necessary to address cognition/behavior during functional activity;To address functional/ADL transfers PT goals addressed during session: Mobility/safety with mobility;Balance OT goals addressed during session: ADL's and self-care      AM-PAC PT "6 Clicks" Mobility   Outcome Measure  Help needed turning from your back to your side while in a flat bed without using bedrails?: A Lot Help needed moving from lying on your back to sitting on the side of a flat bed without using bedrails?: Total Help needed moving to and from a bed to a chair (including a wheelchair)?: A Lot Help needed standing up from a chair using your arms (e.g., wheelchair or bedside chair)?: A Lot Help needed to walk in hospital room?: Total Help needed climbing 3-5 steps with a  railing? : Total 6 Click Score: 9    End of Session Equipment Utilized During Treatment: Other (comment) (sling) Activity Tolerance: Patient limited by lethargy Patient left: in bed;with call bell/phone within reach;with bed alarm set;with family/visitor present Nurse Communication: Mobility status PT Visit Diagnosis: Unsteadiness on feet (R26.81);Difficulty in walking, not elsewhere classified (R26.2);History of falling (Z91.81);Muscle weakness (generalized) (M62.81);Other abnormalities of gait and mobility (R26.89)     Time: 0937-1000 PT Time Calculation (min) (ACUTE ONLY): 23 min  Charges:  $Therapeutic Activity: 8-22 mins                     Marisa Severin, PT, DPT Acute Rehabilitation Services Pager [phone] Office [name] 02/26/2020, 11:38 AM

## 2020-02-26 NOTE — Plan of Care (Signed)
  Problem: Elimination: ?Goal: Will not experience complications related to bowel motility ?Outcome: Progressing ?  ?Problem: Elimination: ?Goal: Will not experience complications related to urinary retention ?Outcome: Progressing ?  ?Problem: Nutrition: ?Goal: Adequate nutrition will be maintained ?Outcome: Not Progressing ?  ?

## 2020-02-27 ENCOUNTER — Inpatient Hospital Stay (HOSPITAL_COMMUNITY): Payer: Medicare PPO

## 2020-02-27 DIAGNOSIS — G9341 Metabolic encephalopathy: Secondary | ICD-10-CM | POA: Diagnosis not present

## 2020-02-27 LAB — CBC
HCT: 32.4 % — ABNORMAL LOW (ref 36.0–46.0)
Hemoglobin: 10.4 g/dL — ABNORMAL LOW (ref 12.0–15.0)
MCH: 35.1 pg — ABNORMAL HIGH (ref 26.0–34.0)
MCHC: 32.1 g/dL (ref 30.0–36.0)
MCV: 109.5 fL — ABNORMAL HIGH (ref 80.0–100.0)
Platelets: 194 10*3/uL (ref 150–400)
RBC: 2.96 MIL/uL — ABNORMAL LOW (ref 3.87–5.11)
RDW: 19.9 % — ABNORMAL HIGH (ref 11.5–15.5)
WBC: 12.2 10*3/uL — ABNORMAL HIGH (ref 4.0–10.5)
nRBC: 0.9 % — ABNORMAL HIGH (ref 0.0–0.2)

## 2020-02-27 LAB — BASIC METABOLIC PANEL
Anion gap: 14 (ref 5–15)
BUN: 12 mg/dL (ref 8–23)
CO2: 18 mmol/L — ABNORMAL LOW (ref 22–32)
Calcium: 8.3 mg/dL — ABNORMAL LOW (ref 8.9–10.3)
Chloride: 111 mmol/L (ref 98–111)
Creatinine, Ser: 1.07 mg/dL — ABNORMAL HIGH (ref 0.44–1.00)
GFR, Estimated: 55 mL/min — ABNORMAL LOW (ref >60)
Glucose, Bld: 116 mg/dL — ABNORMAL HIGH (ref 70–99)
Potassium: 4 mmol/L (ref 3.5–5.1)
Sodium: 143 mmol/L (ref 135–145)

## 2020-02-27 LAB — MAGNESIUM: Magnesium: 1.9 mg/dL (ref 1.7–2.4)

## 2020-02-27 LAB — HEPATIC FUNCTION PANEL
ALT: 71 U/L — ABNORMAL HIGH (ref 0–44)
AST: 63 U/L — ABNORMAL HIGH (ref 15–41)
Albumin: 2.4 g/dL — ABNORMAL LOW (ref 3.5–5.0)
Alkaline Phosphatase: 64 U/L (ref 38–126)
Bilirubin, Direct: 0.2 mg/dL (ref 0.0–0.2)
Indirect Bilirubin: 0.6 mg/dL (ref 0.3–0.9)
Total Bilirubin: 0.8 mg/dL (ref 0.3–1.2)
Total Protein: 5.8 g/dL — ABNORMAL LOW (ref 6.5–8.1)

## 2020-02-27 LAB — PHOSPHORUS: Phosphorus: 3.3 mg/dL (ref 2.5–4.6)

## 2020-02-27 MED ORDER — BENZONATATE 100 MG PO CAPS: 200.0000 mg | ORAL_CAPSULE | Freq: Three times a day (TID) | ORAL | Status: DC | PRN

## 2020-02-27 MED ORDER — KETOROLAC TROMETHAMINE 30 MG/ML IJ SOLN
30.0000 mg | Freq: Once | INTRAMUSCULAR | Status: AC
  Administered 2020-02-27: 30 mg via INTRAVENOUS
  Filled 2020-02-27: qty 1

## 2020-02-27 MED ORDER — POLYVINYL ALCOHOL 1.4 % OP SOLN
1.0000 [drp] | OPHTHALMIC | Status: DC | PRN
  Administered 2020-02-28 (×2): 1 [drp] via OPHTHALMIC
  Filled 2020-02-27: qty 15

## 2020-02-27 MED ORDER — SACCHAROMYCES BOULARDII 250 MG PO CAPS
250.0000 mg | ORAL_CAPSULE | Freq: Two times a day (BID) | ORAL | Status: DC
  Administered 2020-02-27 – 2020-02-29 (×5): 250 mg via ORAL
  Filled 2020-02-27 (×5): qty 1

## 2020-02-27 MED ORDER — WHITE PETROLATUM EX OINT
TOPICAL_OINTMENT | CUTANEOUS | Status: AC
  Filled 2020-02-27: qty 28.35

## 2020-02-27 NOTE — Progress Notes (Signed)
PROGRESS NOTE  Ebony Scott CBJ:[id number] DOB: 06/01/1950 DOA: 02/23/2020 PCP: [name], MD   LOS: 3 days   Brief narrative:  Ebony Scott is a 70 y.o. female with medical history significant of DVT on Xarelto, mild intermittent asthma, severe tracheobronchomalacia on at bedtime CPAP, chronic diastolic CHF, CKD stage II, osteoporosis, rheumatoid arthritis on chronic steroid, breast cancer in remission, recent right shoulder arthroplasty on 02/19/2020 who presented to the hospital with altered mental status.  After evaluation as detailed within, baclofen has been thought to be primary culprit.   Assessment/Plan:  Principal Problem:   Metabolic encephalopathy Active Problems:   Hyperlipidemia   NECK PAIN, CHRONIC   LPRD (laryngopharyngeal reflux disease)   History of breast cancer   S/P reverse total shoulder arthroplasty, right  Altered mental status likely acute metabolic encephalopathy  Markedly improved this AM. Baclofen on hold. Other evaluation included CT head, MRI, EEG, VBG X2, ammonia.   - PT/OT evaluation (likely 1/9) pending assessment, possibly discharge 1/9-/1/10.   History of intermittent asthma, tracheobronchomalacia.  Worsened overnight, now slightly improved this AM. CXR given concerns for sputum production on admission.  - CXR shows atelectasis, ISP, encourage ambulation, wean oxygen  - CPAP at night   CKD stage IIIa Will closely monitor BMP. Creatinine improving, monitor   Rheumatoid arthritis on chronic steroid. Continued home medicatoins.  Right shoulder arthroplasty on 02/19/2020.  With extensive old bruise on the arm.  No tense hematoma or signs of infection. Hemoglobin stable.   Seen by orthopedics.  On Xarelto, will continue for now We will add a Lidoderm patch, ice compression and arm sling.  History of DVT and PE. On xarelto as outpatient.  Likely on continued Xarelto secondary to history of breast cancer with PE.  Chest x-ray with bibasilar  atelectasis.   Afebrile, no leukocytosis.  No symptoms.  No need for antibiotics.   Lactate was 1.3.  Blood cultures negative in 1 day  Hypokalemia.  Has improved.  Magnesium of 2.0.  Check BMP in a.m.  Hypernatremia.  Resolved. Will DC fluids as this has improved and she eating.   Elevated AST/ALT.  T bilirubin improved, likely medication induced, monitor, if not improving as outpatient recommend repeat viral testing (negative Hep C, Hep B, Hep A in past)   Macrocytic anemia- likely in part from leflunomide. B12 elevated, folate normal.   Leukocytosis- monitor- blood cultures negative. This is improving.   DVT prophylaxis: rivaroxaban (XARELTO) tablet 20 mg Start: 02/23/20 1800 rivaroxaban (XARELTO) tablet 20 mg   Code Status:  Full code  Family Communication:  Updated daughter and son on phone.   Status is: Inpatient  The patient is inpatient because: IV treatments appropriate due to intensity of illness or inability to take PO and Inpatient level of care appropriate due to severity of illness, ongoing encephalopathy, unsafe disposition  Dispo: The patient is from: Home              Anticipated d/c is to: Re-evaluate on 1/9---may be able to go home.               Anticipated d/c date is: 2 days              Patient currently is not medically stable to d/c.  Consultants:  Orthopedics   Neurology  Procedures:  CPAP  Antibiotics:  . None   Subjective: Today, patient was seen and examined at bedside. Alert, oriented, talkative. Able to recall some of recent events.  Objective: Vitals:   02/27/20 0411 02/27/20 0642  BP: (!) 161/94 (!) 148/92  Pulse: (!) 104 (!) 102  Resp: (!) 25 (!) 24  Temp: (!) 97.5 F (36.4 C) (!) 97.4 F (36.3 C)  SpO2: 90% 95%    Intake/Output Summary (Last 24 hours) at 02/27/2020 0704 Last data filed at 02/27/2020 0600 Gross per 24 hour  Intake 1396.04 ml  Output --  Net 1396.04 ml   There were no vitals filed for this visit. There  is no height or weight on file to calculate BMI.   Physical Exam:  GENERAL: Alert, oriented, appropriate.  HENT: No scleral pallor or icterus.  Symmetric pupils.  Left cheek with mild abrasion. NECK: is supple, no gross swelling noted.  No neck stiffness. CHEST: Transmitted upper airway noises. Intermittent tachypnea, resolved.  No crackles or wheezes.  Diminished breath sounds bilaterally. CVS: S1 and S2 heard, no murmur. Regular rate and rhythm.  ABDOMEN: Soft, non-tender, bowel sounds are present. EXTREMITIES: No edema.  Right shoulder with recent surgery and extensive light bruise over the right mammary area and upper extremity.Wearing sling  CNS: Somnolent, moving extremities, moaning SKIN: warm and dry without rashes.  Extensive bruising over the chest wall,arm and forearm which appears to resolving.  Data Review: I have personally reviewed the following laboratory data and studies,  CBC: Recent Labs  Lab 02/23/20 0944 02/23/20 1018 02/23/20 1942 02/23/20 2326 02/24/20 0517 02/26/20 0732 02/27/20 0143  WBC 12.5*  --   --  9.7 10.4 13.1* 12.2*  NEUTROABS  --   --   --  7.8*  --   --   --   HGB 10.6*   < > 8.8* 9.4* 11.2* 9.4* 10.4*  HCT 32.6*   < > 26.0* 28.7* 35.5* 30.6* 32.4*  MCV 107.6*  --   --  107.9* 110.9* 110.9* 109.5*  PLT 198  --   --  182 170 196 194   < > = values in this interval not displayed.   Basic Metabolic Panel: Recent Labs  Lab 02/23/20 0944 02/23/20 1018 02/23/20 1942 02/24/20 0517 02/26/20 0732 02/27/20 0143  NA 138 136 139 141 147* 143  K 4.0 3.4* 3.3* 3.9 3.7 4.0  CL 96* 101  --  106 115* 111  CO2 24  --   --  19* 18* 18*  GLUCOSE 187* 174*  --  138* 125* 116*  BUN 48* 44*  --  29* 13 12  CREATININE 1.52* 1.40*  --  1.25* 1.03* 1.07*  CALCIUM 9.3  --   --  8.6* 8.0* 8.3*  MG  --   --   --   --  2.0 1.9  PHOS  --   --   --   --  2.2* 3.3   Liver Function Tests: Recent Labs  Lab 02/23/20 0944 02/24/20 0517  AST 67* 63*  ALT 115*  89*  ALKPHOS 71 67  BILITOT 1.5* 1.9*  PROT 6.1* 5.4*  ALBUMIN 2.9* 2.4*   No results for input(s): LIPASE, AMYLASE in the last 168 hours. Recent Labs  Lab 02/23/20 1242  AMMONIA 19   Cardiac Enzymes: Recent Labs  Lab 02/23/20 1234  CKTOTAL 468*   BNP (last 3 results) Recent Labs    05/07/19 0953  BNP 41    ProBNP (last 3 results) No results for input(s): PROBNP in the last 8760 hours.  CBG: No results for input(s): GLUCAP in the last 168 hours. Recent Results (from the past  240 hour(s))  Resp Panel by RT-PCR (Flu A&B, Covid) Nasopharyngeal Swab     Status: None   Collection Time: 02/23/20 12:08 PM   Specimen: Nasopharyngeal Swab; Nasopharyngeal(NP) swabs in vial transport medium  Result Value Ref Range Status   SARS Coronavirus 2 by RT PCR NEGATIVE NEGATIVE Final    Comment: (NOTE) SARS-CoV-2 target nucleic acids are NOT DETECTED.  The SARS-CoV-2 RNA is generally detectable in upper respiratory specimens during the acute phase of infection. The lowest concentration of SARS-CoV-2 viral copies this assay can detect is 138 copies/mL. A negative result does not preclude SARS-Cov-2 infection and should not be used as the sole basis for treatment or other patient management decisions. A negative result may occur with  improper specimen collection/handling, submission of specimen other than nasopharyngeal swab, presence of viral mutation(s) within the areas targeted by this assay, and inadequate number of viral copies(<138 copies/mL). A negative result must be combined with clinical observations, patient history, and epidemiological information. The expected result is Negative.  Fact Sheet for Patients:  BloggerCourse.com  Fact Sheet for Healthcare Providers:  SeriousBroker.it  This test is no t yet approved or cleared by the [place] FDA and  has been authorized for detection and/or diagnosis of SARS-CoV-2  by FDA under an Emergency Use Authorization (EUA). This EUA will remain  in effect (meaning this test can be used) for the duration of the COVID-19 declaration under Section 564(b)(1) of the Act, 21 U.S.C.section 360bbb-3(b)(1), unless the authorization is terminated  or revoked sooner.       Influenza A by PCR NEGATIVE NEGATIVE Final   Influenza B by PCR NEGATIVE NEGATIVE Final    Comment: (NOTE) The Xpert Xpress SARS-CoV-2/FLU/RSV plus assay is intended as an aid in the diagnosis of influenza from Nasopharyngeal swab specimens and should not be used as a sole basis for treatment. Nasal washings and aspirates are unacceptable for Xpert Xpress SARS-CoV-2/FLU/RSV testing.  Fact Sheet for Patients: BloggerCourse.com  Fact Sheet for Healthcare Providers: SeriousBroker.it  This test is not yet approved or cleared by the [place] FDA and has been authorized for detection and/or diagnosis of SARS-CoV-2 by FDA under an Emergency Use Authorization (EUA). This EUA will remain in effect (meaning this test can be used) for the duration of the COVID-19 declaration under Section 564(b)(1) of the Act, 21 U.S.C. section 360bbb-3(b)(1), unless the authorization is terminated or revoked.  Performed at [facility], [address], [place], [place] [address]   Culture, blood (routine x 2)     Status: None (Preliminary result)   Collection Time: 02/23/20  2:40 PM   Specimen: BLOOD  Result Value Ref Range Status   Specimen Description BLOOD SITE NOT SPECIFIED  Final   Special Requests   Final    BOTTLES DRAWN AEROBIC AND ANAEROBIC Blood Culture adequate volume   Culture   Final    NO GROWTH 2 DAYS Performed at [facility], [address], [place], [place] [address]    Report Status PENDING  Incomplete  Culture, blood (routine x 2)     Status: None (Preliminary result)   Collection Time: 02/23/20 11:39 PM   Specimen: BLOOD   Result Value Ref Range Status   Specimen Description BLOOD RIGHT ANTECUBITAL  Final   Special Requests   Final    BOTTLES DRAWN AEROBIC AND ANAEROBIC Blood Culture results may not be optimal due to an excessive volume of blood received in culture bottles   Culture  Final    NO GROWTH 1 DAY Performed at [facility], [place] [address], [place], [place] [address]    Report Status PENDING  Incomplete  MRSA PCR Screening     Status: None   Collection Time: 02/24/20  4:35 PM   Specimen: Nasal Mucosa; Nasopharyngeal  Result Value Ref Range Status   MRSA by PCR NEGATIVE NEGATIVE Final    Comment:        The GeneXpert MRSA Assay (FDA approved for NASAL specimens only), is one component of a comprehensive MRSA colonization surveillance program. It is not intended to diagnose MRSA infection nor to guide or monitor treatment for MRSA infections. Performed at [facility], [place] [address] [place], [place] [address]      Studies: MR BRAIN WO CONTRAST  Result Date: 02/25/2020 CLINICAL DATA:  Delirium EXAM: MRI HEAD WITHOUT CONTRAST TECHNIQUE: Multiplanar, multiecho pulse sequences of the brain and surrounding structures were obtained without intravenous contrast. COMPARISON:  02/23/2020 and prior. FINDINGS: Examination was terminated early secondary to patient disposition. Please note that motion artifact and limited sequence acquisition limits evaluation. Brain: No focal diffusion-weighted signal abnormality. No intracranial hemorrhage. No midline shift, ventriculomegaly or extra-axial fluid collection. No mass lesion. Mild cerebral atrophy with ex vacuo dilatation. Mild chronic microvascular ischemic changes. Vascular: Not well evaluated on this exam. Skull and upper cervical spine: Normal marrow signal. Sinuses/Orbits: Normal orbits. Clear paranasal sinuses. No mastoid effusion. Other: None. IMPRESSION: No acute intracranial process. Mild cerebral atrophy and chronic microvascular  ischemic changes. Please note that limited sequence acquisition and motion artifact limits evaluation. Electronically Signed   By: Primitivo Gauze M.D.   On: 02/25/2020 15:14      Reve Crocket Evern Core, MD  [facility] 02/27/2020  If 7PM-7AM, please contact night-coverage

## 2020-02-27 NOTE — Progress Notes (Incomplete)
Found patient to be incontinent of stool, pericare provided. Repositioned for comfort. Breath sounds coarse, PRN Albuterol given.  Patient more alert, verbal , complained of pain, physician notified, see orders.  Patient requested CPAP machine, respiratory therapy notified,

## 2020-02-27 NOTE — Progress Notes (Signed)
Entered patient room, wheezing and stridor-like sounds heard.  Patient repositioned to upright position, CPAP placed on patient, vitals taken.  RR 25, HR 104.  Patient began to appear more comfortable after a few minutes.  Respiratory therapy called to assess patient.  Physician notified respiratory status, yellow muse score for respirations and HR.  Received response to notify if patient did not improve after intervention. At this time, patient appears comfortable, resting with eyes closed,   respirations unlabored.

## 2020-02-27 NOTE — Progress Notes (Signed)
Pt placed on cpap 

## 2020-02-28 DIAGNOSIS — E782 Mixed hyperlipidemia: Secondary | ICD-10-CM | POA: Diagnosis not present

## 2020-02-28 LAB — CBC
HCT: 31.5 % — ABNORMAL LOW (ref 36.0–46.0)
Hemoglobin: 10 g/dL — ABNORMAL LOW (ref 12.0–15.0)
MCH: 34.8 pg — ABNORMAL HIGH (ref 26.0–34.0)
MCHC: 31.7 g/dL (ref 30.0–36.0)
MCV: 109.8 fL — ABNORMAL HIGH (ref 80.0–100.0)
Platelets: 196 10*3/uL (ref 150–400)
RBC: 2.87 MIL/uL — ABNORMAL LOW (ref 3.87–5.11)
RDW: 19.9 % — ABNORMAL HIGH (ref 11.5–15.5)
WBC: 12.1 10*3/uL — ABNORMAL HIGH (ref 4.0–10.5)
nRBC: 2.6 % — ABNORMAL HIGH (ref 0.0–0.2)

## 2020-02-28 LAB — BASIC METABOLIC PANEL
Anion gap: 10 (ref 5–15)
BUN: 14 mg/dL (ref 8–23)
CO2: 19 mmol/L — ABNORMAL LOW (ref 22–32)
Calcium: 8.5 mg/dL — ABNORMAL LOW (ref 8.9–10.3)
Chloride: 114 mmol/L — ABNORMAL HIGH (ref 98–111)
Creatinine, Ser: 1.12 mg/dL — ABNORMAL HIGH (ref 0.44–1.00)
GFR, Estimated: 52 mL/min — ABNORMAL LOW (ref >60)
Glucose, Bld: 95 mg/dL (ref 70–99)
Potassium: 4.3 mmol/L (ref 3.5–5.1)
Sodium: 143 mmol/L (ref 135–145)

## 2020-02-28 LAB — CULTURE, BLOOD (ROUTINE X 2)
Culture: NO GROWTH
Special Requests: ADEQUATE

## 2020-02-28 LAB — MAGNESIUM: Magnesium: 1.9 mg/dL (ref 1.7–2.4)

## 2020-02-28 LAB — PHOSPHORUS: Phosphorus: 2.3 mg/dL — ABNORMAL LOW (ref 2.5–4.6)

## 2020-02-28 NOTE — TOC Initial Note (Signed)
Transition of Care [facility]) - Initial/Assessment Note    Patient Details  Name: Ebony Scott MRN: [id number] Date of Birth: 09/05/1950  [facility] CM/SW Contact:    Coralee Pesa, [place] Phone Number: 02/28/2020, 10:42 AM  Clinical Narrative:                 CSW spoke with pt at bedside to discuss SNF recommendation. Pt noted that she doesn't remember working with physical therapy, but is definitely against SNF, since last time she got CDIFF. Pt noted that she worked with a [place] before, but couldn't remember who. Pt noted that she has a walker and wheelchair at home, but also has steps into her house. She stated that she needs to be able to walk before she can leave. She advised CSW that her daughter will be coming to stay with her, and that she can be contacted for more information. CSW spoke with renee, pt's daughter, who was in agreement against SNF. She confirmed that she is coming to stay with the pt. She was unsure of the name of the [place], but provided a number for [employer] [phone]). Dtr would like to have pt evaluated by PT again to determine her abilities after the Baclofen got out of her system. Dtr will be at hospital after 2 tomorrow. SW will continue to follow for DC planning.  Expected Discharge Plan: [place] Barriers to Discharge: Continued Medical Work up   Patient Goals and CMS Choice Patient states their goals for this hospitalization and ongoing recovery are:: Pt states she needs to be able to walk, but is very against SNF   Choice offered to / list presented to : Patient  Expected Discharge Plan and Services Expected Discharge Plan: [place] Choice: [place] arrangements for the past 2 months: Single Family Home                                      Prior Living Arrangements/Services Living arrangements for the past 2 months: Single Family Home Lives with::  Self Patient language and need for interpreter reviewed:: Yes Do you feel safe going back to the place where you live?: Yes      Need for Family Participation in Patient Care: Yes (Comment) Care giver support system in place?: Yes (comment) Current home services: DME,Homehealth aide,Home PT Criminal Activity/Legal Involvement Pertinent to Current Situation/Hospitalization: No - Comment as needed  Activities of Daily Living      Permission Sought/Granted Permission sought to share information with : Family Supports Permission granted to share information with : Yes, Verbal Permission Granted  Share Information with NAME: Shivali Quackenbush     Permission granted to share info w Relationship: Daughter  Permission granted to share info w Contact Information: [phone]  Emotional Assessment Appearance:: Appears stated age Attitude/Demeanor/Rapport: Engaged Affect (typically observed): Appropriate Orientation: : Oriented to Self,Oriented to Place,Oriented to  Time,Oriented to Situation Alcohol / Substance Use: Not Applicable Psych Involvement: No (comment)  Admission diagnosis:  Tachypnea [R06.82] Trauma [T14.90XA] SIRS (systemic inflammatory response syndrome) (HCC) [R65.10] Acute encephalopathy [G93.40] Altered mental status, unspecified altered mental status type [R41.82] AMS (altered mental status) [T70.17] Metabolic encephalopathy [B93.90] Patient Active Problem List   Diagnosis Date Noted  . Metabolic encephalopathy 30/10/2328  . Acute encephalopathy   .  AMS (altered mental status) 02/23/2020  . S/P reverse total shoulder arthroplasty, right   . Hypokalemia   . DOE (dyspnea on exertion) 02/18/2020  . 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 S/P reverse total shoulder arthroplasty, left 02/16/2020  . Preoperative clearance 01/26/2020  . Closed displaced fracture of metatarsal bone of right foot 01/20/2019  . History of pulmonary embolus (PE) 12/19/2018  .  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 Rheumatoid arthritis ([place])   . Bilateral impacted cerumen 08/18/2018  . Fungal otitis externa 08/18/2018  . Asthmatic bronchitis with exacerbation 07/29/2018  . Pulmonary embolism ([place]) 07/29/2018  . Cellulitis of forearm   . Fever of unknown origin (FUO) 02/10/2018  . Abdominal pain 02/10/2018  . Fracture of base of fifth metatarsal bone with routine healing, left   . Closed fracture of fifth metatarsal bone of right foot, initial encounter 01/15/2018  . DVT (deep venous thrombosis) ([place]) 01/14/2018  . Hypothyroidism 01/14/2018  . Depression 01/14/2018  . Debility   . Malnutrition of moderate degree 12/17/2017  . Knee pain   . History of breast cancer   . Chronic pain syndrome   . Fibromyalgia   . Graves disease   . Sepsis due to undetermined organism ([place]) 12/13/2017  . Rhabdomyolysis 12/13/2017  . Dysphonia 08/10/2017  . Multiple lung nodules on CT   . CKD (chronic kidney disease), stage III ([place]) 04/15/2016  . Hx of adenomatous colonic polyps 04/12/2016  . Cough 02/09/2016  . Opacity of lung on imaging study 11/03/2015  . Severe persistent asthma 02/08/2015  . Facial pain syndrome 02/08/2015  . Insomnia 02/08/2015  . Other allergic rhinitis 02/08/2015  . LPRD (laryngopharyngeal reflux disease) 02/08/2015  . Chronic diastolic CHF (congestive heart failure) ([place]) 02/08/2015  . Benign paroxysmal positional vertigo 12/03/2012  . Dizziness and giddiness 10/29/2012  . Disequilibrium 10/29/2012  . Pseudogout 02/24/2012  . Pneumonia 05/31/2011  . Irritable bowel syndrome 01/02/2010  . Hyperlipidemia 12/23/2009  . Hyperthyroidism 11/12/2006  . Seasonal and perennial allergic rhinitis 11/12/2006  . GERD 11/12/2006  . NECK PAIN, CHRONIC 11/12/2006  . OSTEOPOROSIS 11/12/2006  . BREAST CANCER, HX OF 11/12/2006   PCP:  [name], MD Pharmacy:   [facility] [place] [address] Phone: [phone] Fax: [phone]     Social Determinants of Health (SDOH) Interventions    Readmission Risk Interventions No flowsheet data found.

## 2020-02-28 NOTE — Progress Notes (Signed)
PROGRESS NOTE    Ebony Scott  [id number]  DOB: March 25, 1950  DOA: 02/23/2020 PCP: [name], MD Outpatient Specialists:   Hospital course:  70 year old female with DVT on Xarelto, asthma and severe tracheobronchomalacia on bedtime CPAP, HFpEF, CKD 2 and RA was admitted with altered mental status.  She had recently undergone right shoulder arthroplasty and had been started on baclofen.  Encephalopathy was thought to be secondary to baclofen, improving after baclofen was discontinued.  Subjective:  Patient states she does not want to go to SNF as recommended by PT.  Notes that she got C. difficile there in the past and would like to go home.  Notes that her daughter will be coming back tomorrow and will be able to help her at home.  Patient denies confusion.  Notes that she is here because she was confused.  She understands that this was secondary to baclofen.     Objective: Vitals:   02/27/20 2200 02/28/20 0530 02/28/20 0858 02/28/20 0900  BP: (!) 153/83 (!) 154/86  (!) 147/80  Pulse: (!) 103 (!) 105  (!) 101  Resp: 20 20  18   Temp: 97.7 F (36.5 C) 97.9 F (36.6 C)  98 F (36.7 C)  TempSrc: Axillary Axillary  Oral  SpO2: 100% 96% 97% 96%    Intake/Output Summary (Last 24 hours) at 02/28/2020 1524 Last data filed at 02/28/2020 1027 Gross per 24 hour  Intake 481 ml  Output 225 ml  Net 256 ml   There were no vitals filed for this visit.   Exam:  General: Chronically ill-appearing female lying in bed in no acute distress.  Awake and alert. Eyes: sclera anicteric, conjuctiva mild injection bilaterally CVS: S1-S2, regular  Respiratory:  decreased air entry bilaterally secondary to decreased inspiratory effort, rales at bases  GI: NABS, soft, NT  LE: No edema.  Neuro: Coherent, normal mental status, grossly nonfocal.  Psych: patient is logical and coherent, judgement and insight appear normal, mood and affect appropriate to situation.   Assessment & Plan:    70 year old female was admitted with metabolic encephalopathy thought to be secondary to baclofen now improved and back to normal mental status off baclofen.  Acute metabolic encephalopathy Improved off baclofen  Disposition Declines referral to SNF as recommended by PT Patient can be discharged tomorrow once her daughter returns and can be at home to help her  History of asthma/tracheobronchomalacia CPAP at night   From previous note by Dr. Owens Shark: CKD stage IIIa Will closely monitor BMP. Creatinine improving, monitor   Rheumatoid arthritis on chronic steroid. Continued home medicatoins.  Right shoulder arthroplasty on 02/19/2020.  With extensive old bruise on the arm.  No tense hematoma or signs of infection. Hemoglobin stable.   Seen by orthopedics.  On Xarelto, will continue for now We will add a Lidoderm patch, ice compression and arm sling.  History of DVT and PE. On xarelto as outpatient.  Likely on continued Xarelto secondary to history of breast cancer with PE.  Chest x-ray with bibasilar atelectasis.   Afebrile, no leukocytosis.  No symptoms.  No need for antibiotics.   Lactate was 1.3.  Blood cultures negative in 1 day  Hypokalemia.    Resolved. Magnesium of 2.0.    Hypernatremia.  Resolved. Will DC fluids as this has improved and she eating.   Elevated AST/ALT.  T bilirubin improved, likely medication induced, monitor, if not improving as outpatient recommend repeat viral testing (negative Hep C, Hep B, Hep A  in past)   Macrocytic anemia- likely in part from leflunomide. B12 elevated, folate normal.     DVT prophylaxis: Xarelto Code Status: Full Family Communication: Education officer, museum has been in contact with daughter regarding disposition Disposition Plan:   Patient is from: Home  Anticipated Discharge Location: Home  Barriers to Discharge: Awaiting daughter to come home so she can be safely discharged  Is patient medically stable for Discharge:  Yes   Consultants:  Orthopedic  Neurology  Procedures:  None  Antimicrobials:  None   Data Reviewed:  Basic Metabolic Panel: Recent Labs  Lab 02/23/20 0944 02/23/20 1018 02/23/20 1942 02/24/20 0517 02/26/20 0732 02/27/20 0143 02/28/20 0330  NA 138 136 139 141 147* 143 143  K 4.0 3.4* 3.3* 3.9 3.7 4.0 4.3  CL 96* 101  --  106 115* 111 114*  CO2 24  --   --  19* 18* 18* 19*  GLUCOSE 187* 174*  --  138* 125* 116* 95  BUN 48* 44*  --  29* 13 12 14   CREATININE 1.52* 1.40*  --  1.25* 1.03* 1.07* 1.12*  CALCIUM 9.3  --   --  8.6* 8.0* 8.3* 8.5*  MG  --   --   --   --  2.0 1.9 1.9  PHOS  --   --   --   --  2.2* 3.3 2.3*   Liver Function Tests: Recent Labs  Lab 02/23/20 0944 02/24/20 0517 02/27/20 0143  AST 67* 63* 63*  ALT 115* 89* 71*  ALKPHOS 71 67 64  BILITOT 1.5* 1.9* 0.8  PROT 6.1* 5.4* 5.8*  ALBUMIN 2.9* 2.4* 2.4*   No results for input(s): LIPASE, AMYLASE in the last 168 hours. Recent Labs  Lab 02/23/20 1242  AMMONIA 19   CBC: Recent Labs  Lab 02/23/20 2326 02/24/20 0517 02/26/20 0732 02/27/20 0143 02/28/20 0330  WBC 9.7 10.4 13.1* 12.2* 12.1*  NEUTROABS 7.8*  --   --   --   --   HGB 9.4* 11.2* 9.4* 10.4* 10.0*  HCT 28.7* 35.5* 30.6* 32.4* 31.5*  MCV 107.9* 110.9* 110.9* 109.5* 109.8*  PLT 182 170 196 194 196   Cardiac Enzymes: Recent Labs  Lab 02/23/20 1234  CKTOTAL 468*   BNP (last 3 results) No results for input(s): PROBNP in the last 8760 hours. CBG: No results for input(s): GLUCAP in the last 168 hours.  Recent Results (from the past 240 hour(s))  Resp Panel by RT-PCR (Flu A&B, Covid) Nasopharyngeal Swab     Status: None   Collection Time: 02/23/20 12:08 PM   Specimen: Nasopharyngeal Swab; Nasopharyngeal(NP) swabs in vial transport medium  Result Value Ref Range Status   SARS Coronavirus 2 by RT PCR NEGATIVE NEGATIVE Final    Comment: (NOTE) SARS-CoV-2 target nucleic acids are NOT DETECTED.  The SARS-CoV-2 RNA is  generally detectable in upper respiratory specimens during the acute phase of infection. The lowest concentration of SARS-CoV-2 viral copies this assay can detect is 138 copies/mL. A negative result does not preclude SARS-Cov-2 infection and should not be used as the sole basis for treatment or other patient management decisions. A negative result may occur with  improper specimen collection/handling, submission of specimen other than nasopharyngeal swab, presence of viral mutation(s) within the areas targeted by this assay, and inadequate number of viral copies(<138 copies/mL). A negative result must be combined with clinical observations, patient history, and epidemiological information. The expected result is Negative.  Fact Sheet for Patients:  EntrepreneurPulse.com.au  Fact Sheet for Healthcare Providers:  IncredibleEmployment.be  This test is no t yet approved or cleared by the [place] FDA and  has been authorized for detection and/or diagnosis of SARS-CoV-2 by FDA under an Emergency Use Authorization (EUA). This EUA will remain  in effect (meaning this test can be used) for the duration of the COVID-19 declaration under Section 564(b)(1) of the Act, 21 U.S.C.section 360bbb-3(b)(1), unless the authorization is terminated  or revoked sooner.       Influenza A by PCR NEGATIVE NEGATIVE Final   Influenza B by PCR NEGATIVE NEGATIVE Final    Comment: (NOTE) The Xpert Xpress SARS-CoV-2/FLU/RSV plus assay is intended as an aid in the diagnosis of influenza from Nasopharyngeal swab specimens and should not be used as a sole basis for treatment. Nasal washings and aspirates are unacceptable for Xpert Xpress SARS-CoV-2/FLU/RSV testing.  Fact Sheet for Patients: EntrepreneurPulse.com.au  Fact Sheet for Healthcare Providers: IncredibleEmployment.be  This test is not yet approved or cleared by the [place] FDA and has been authorized for detection and/or diagnosis of SARS-CoV-2 by FDA under an Emergency Use Authorization (EUA). This EUA will remain in effect (meaning this test can be used) for the duration of the COVID-19 declaration under Section 564(b)(1) of the Act, 21 U.S.C. section 360bbb-3(b)(1), unless the authorization is terminated or revoked.  Performed at [facility], [place] [address], [place], [place] [address]   Culture, blood (routine x 2)     Status: None   Collection Time: 02/23/20  2:40 PM   Specimen: BLOOD  Result Value Ref Range Status   Specimen Description BLOOD SITE NOT SPECIFIED  Final   Special Requests   Final    BOTTLES DRAWN AEROBIC AND ANAEROBIC Blood Culture adequate volume   Culture   Final    NO GROWTH 5 DAYS Performed at [facility], [place] [address], [place], [place] [address]    Report Status 02/28/2020 FINAL  Final  Culture, blood (routine x 2)     Status: None (Preliminary result)   Collection Time: 02/23/20 11:39 PM   Specimen: BLOOD  Result Value Ref Range Status   Specimen Description BLOOD RIGHT ANTECUBITAL  Final   Special Requests   Final    BOTTLES DRAWN AEROBIC AND ANAEROBIC Blood Culture results may not be optimal due to an excessive volume of blood received in culture bottles   Culture   Final    NO GROWTH 4 DAYS Performed at [facility], [place] [address], [place], [place] [address]    Report Status PENDING  Incomplete  MRSA PCR Screening     Status: None   Collection Time: 02/24/20  4:35 PM   Specimen: Nasal Mucosa; Nasopharyngeal  Result Value Ref Range Status   MRSA by PCR NEGATIVE NEGATIVE Final    Comment:        The GeneXpert MRSA Assay (FDA approved for NASAL specimens only), is one component of a comprehensive MRSA colonization surveillance program. It is not intended to diagnose MRSA infection nor to guide or monitor treatment for MRSA infections. Performed at [facility], [place]  [address], [place], [place] [address]       Studies: DG Chest 1 View  Result Date: 02/27/2020 CLINICAL DATA:  Shortness of breath EXAM: CHEST  1 VIEW COMPARISON:  February 24, 2020 FINDINGS: Haziness over the lung bases favored represent layering effusions and underlying opacities. The heart, hila, and mediastinum are normal. No pneumothorax. No  other acute abnormalities. IMPRESSION: Layering effusions with underlying opacities, likely atelectasis. No other acute abnormalities. Electronically Signed   By: Dorise Bullion III M.D   On: 02/27/2020 11:37     Scheduled Meds: . acidophilus  1 capsule Oral Daily  . allopurinol  100 mg Oral Daily  . ammonium lactate  1 application Topical QHS  . atorvastatin  10 mg Oral q1800  . calcium carbonate  1,250 mg Oral BID  . cholecalciferol  1,000 Units Oral Daily  . famotidine  20 mg Oral BID  . fluticasone  2 spray Each Nare Daily  . folic acid  1 mg Oral Daily  . guaiFENesin  600 mg Oral BID  . leflunomide  20 mg Oral Daily  . lidocaine  1 patch Transdermal Daily  . mometasone-formoterol  2 puff Inhalation BID  . montelukast  10 mg Oral QPM  . mupirocin ointment  1 application Topical QHS  . nystatin  5 mL Mouth/Throat QHS  . pantoprazole  40 mg Oral Daily  . polyvinyl alcohol  1 drop Both Eyes BID  . predniSONE  15 mg Oral Q breakfast  . rivaroxaban  20 mg Oral Q supper  . saccharomyces boulardii  250 mg Oral BID  . umeclidinium bromide  1 puff Inhalation Daily  . urea   Topical QHS   Continuous Infusions:  Principal Problem:   Metabolic encephalopathy Active Problems:   Hyperlipidemia   NECK PAIN, CHRONIC   LPRD (laryngopharyngeal reflux disease)   History of breast cancer   S/P reverse total shoulder arthroplasty, right     Izel Hochberg Derek Jack, [facility]  If 7PM-7AM, please contact night-coverage [email or web address] Password [email or web address] 02/28/2020, 3:24 PM    LOS: 4 days

## 2020-02-28 NOTE — Progress Notes (Signed)
Pt refused CPAP for the night. I told her to let her know know to call if she changed her mind.

## 2020-02-29 ENCOUNTER — Ambulatory Visit: Payer: Medicare PPO | Admitting: Cardiology

## 2020-02-29 ENCOUNTER — Inpatient Hospital Stay (HOSPITAL_COMMUNITY): Payer: Medicare PPO

## 2020-02-29 DIAGNOSIS — Z853 Personal history of malignant neoplasm of breast: Secondary | ICD-10-CM | POA: Diagnosis not present

## 2020-02-29 DIAGNOSIS — E785 Hyperlipidemia, unspecified: Secondary | ICD-10-CM | POA: Diagnosis not present

## 2020-02-29 DIAGNOSIS — G9341 Metabolic encephalopathy: Secondary | ICD-10-CM | POA: Diagnosis not present

## 2020-02-29 DIAGNOSIS — Z96612 Presence of left artificial shoulder joint: Secondary | ICD-10-CM

## 2020-02-29 DIAGNOSIS — M542 Cervicalgia: Secondary | ICD-10-CM | POA: Diagnosis not present

## 2020-02-29 LAB — CULTURE, BLOOD (ROUTINE X 2): Culture: NO GROWTH

## 2020-02-29 NOTE — TOC Transition Note (Signed)
Transition of Care [facility]) - CM/SW Discharge Note   Patient Details  Name: Ebony Scott MRN: [id number] Date of Birth: 27-Jul-1950  Transition of Care [facility] CM/SW Contact:  Bartholomew Crews, RN Phone Number: [phone] 02/29/2020, 1:57 PM   Clinical Narrative:     Spoke with patient at the bedside. PTA patient active with [facility]. Verified active status with [facility]. HH orders in for resumption of services. Internet search of phone number [phone] found [place] for personal care services - patient adived that this was for private pay personal caregiver. Patient verbalized understanding. Patient stated that she has a wheelchair, walker, and 3N1 already. No DME needs at this time. Daughter to provide transportation. Patient stated that she is excited to be going home today. No further TOC needs identified.   Final next level of care: [place] Barriers to Discharge: No Barriers Identified   Patient Goals and CMS Choice Patient states their goals for this hospitalization and ongoing recovery are:: return home with family support CMS Medicare.gov Compare Post Acute Care list provided to:: Patient Choice offered to / list presented to : Patient  Discharge Placement                       Discharge Plan and Services     Post Acute Care Choice: Home Health          DME Arranged: N/A DME Agency: NA       HH Arranged: PT,OT [place] Agency: [place] Date [facility] Agency Contacted: 02/29/20 Time [place]: [phone] Representative spoke with at [place]: [place] Interventions     Readmission Risk Interventions No flowsheet data found.

## 2020-02-29 NOTE — Progress Notes (Signed)
DISCHARGE NOTE HOME Ebony Scott to be discharged Home per MD order. Discussed prescriptions and follow up appointments with the patient and daughter. Prescriptions given to patient; medication list explained in detail. Patient verbalized understanding.  Skin clean, dry and intact without evidence of skin break down, no evidence of skin tears noted. IV catheter discontinued intact. Site without signs and symptoms of complications. Dressing and pressure applied. Pt denies pain at the site currently. No complaints noted.  Patient free of lines, drains, and wounds.   An After Visit Summary (AVS) was printed and given to the patient. Patient escorted via wheelchair, and discharged home via private auto.  Berneta Levins, RN

## 2020-02-29 NOTE — Care Management Important Message (Signed)
Important Message  Patient Details  Name: Ebony Scott MRN: [id number] Date of Birth: 12-24-1950   Medicare Important Message Given:  Yes     Kathline Banbury P Kerens 02/29/2020, 2:20 PM

## 2020-02-29 NOTE — Discharge Summary (Signed)
Physician Discharge Summary  Ebony Scott [id number] DOB: 02-02-1951 DOA: 02/23/2020  PCP: [name], MD  Admit date: 02/23/2020 Discharge date: 02/29/2020  Admitted From: home Disposition:  home  Recommendations for Outpatient Follow-up:  1. Follow up with PCP in 1-2 weeks 2. 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  She had recently undergone right shoulder arthroplasty and had been started on baclofen.  Encephalopathy was thought to be secondary to baclofen, improving after baclofen was discontinued.  Discharge Diagnoses:  Principal Problem:   Metabolic encephalopathy Active Problems:   Hyperlipidemia   NECK PAIN, CHRONIC   LPRD (laryngopharyngeal reflux disease)   History of breast cancer   S/P reverse total shoulder arthroplasty, right  Acute metabolic encephalopathy Improved off baclofen  Disposition Declines referral to SNF as recommended by PT Patient can be discharged tomorrow once her daughter returns and can be at home to help her  History of asthma/tracheobronchomalacia CPAP at night   From previous note by Dr. Owens Shark: CKD stage IIIa Will closely monitor BMP. Creatinineimproving, monitor  Rheumatoid arthritison chronic steroid. Continued home medicatoins.  Right shoulder arthroplasty on 02/19/2020. With extensive old bruise on the arm. No tense hematoma or signs of infection. Hemoglobin stable. Seen by orthopedics. On Xarelto, will continue for now We will add a Lidoderm patch, ice compression and arm sling.  History of DVT and PE.On xarelto as outpatient. Likely on continued Xarelto secondary to history of breast cancer with  PE.  Chest x-ray with bibasilar atelectasis.Afebrile, no leukocytosis. No symptoms. No need for antibiotics. Lactate was 1.3. Blood cultures negative.  Hypokalemia.   Resolved.Magnesium of 2.0.   Hypernatremia.Resolved.   Elevated AST/ALT. T bilirubin improved, likely medication induced, monitor, if not improving as outpatient recommend repeat viral testing (negative Hep C, Hep B, Hep A in past)   Macrocytic anemia-likely in part from leflunomide. B12 elevated, folate normal.   Discharge Instructions:  Discharge Instructions    Call MD for:  persistant nausea and vomiting   Complete by: As directed    Call MD for:  severe uncontrolled pain   Complete by: As directed    Call MD for:  temperature >100.4   Complete by: As directed    Diet - low sodium heart healthy   Complete by: As directed    Increase activity slowly   Complete by: As directed    No wound care   Complete by: As directed      Allergies as of 02/29/2020      Reactions   Dust Mite Extract Shortness Of Breath, Other (See Comments)   "sneezing" (02/20/2012)   Molds & Smuts Shortness Of Breath   Morphine And Related Hives, Itching   Other Shortness Of Breath, Other (See Comments)   Grass and weeds "sneezing; filled sinuses" (02/20/2012)   Penicillins Rash, Other (See Comments)   "welts" (02/20/2012) Has patient had a PCN reaction causing immediate rash, facial/tongue/throat swelling, SOB or lightheadedness with hypotension: Unknown Has patient had a PCN reaction causing severe rash involving mucus membranes or skin necrosis: No Has patient had a PCN reaction that required hospitalization: No Has patient had a PCN reaction occurring within the last 10 years: No If all of the above answers  are "NO", then may proceed with Cephalosporin use. Tolerated Ancef 02/16/20.   Reclast [zoledronic Acid] Other (See Comments)   Fever, Put in hospital, dr said it was a reaction from a reaction    Rofecoxib  Swelling   Vioxx REACTION: feet swelling   Shellfish Allergy Anaphylaxis, Shortness Of Breath, Itching, Rash   Tetracycline Hcl Nausea And Vomiting   Xolair [omalizumab] Other (See Comments)   Caused Blood clot   Dilaudid [hydromorphone Hcl] Itching   Hydrocodone-acetaminophen Nausea And Vomiting   Levofloxacin Other (See Comments)   GI upset   Oxycodone Hcl Nausea And Vomiting   Paroxetine Nausea And Vomiting   Paxil   Baclofen Other (See Comments)   Altered mental status requiring 3 day hospital stay    Celecoxib Swelling   Feet swelling   Diltiazem Swelling   Lactose Intolerance (gi) Other (See Comments)   Bloating and gas   Tree Extract Other (See Comments)   "tested and told I was allergic to it; never experienced a reaction to it" (02/20/2012)      Medication List    STOP taking these medications   fluconazole 100 MG tablet Commonly known as: Diflucan     TAKE these medications   acetaminophen 650 MG CR tablet Commonly known as: TYLENOL Take 1,300 mg by mouth every 8 (eight) hours as needed for pain.   AeroChamber Plus inhaler Use as instructed   albuterol 108 (90 Base) MCG/ACT inhaler Commonly known as: ProAir HFA Inhale 2 puffs into the lungs every 4 (four) hours as needed for wheezing or shortness of breath.   albuterol (2.5 MG/3ML) 0.083% nebulizer solution Commonly known as: PROVENTIL Take 3 mLs (2.5 mg total) by nebulization in the morning and at bedtime. Along with flutter valve   ALIGN PO Take 1 capsule by mouth daily.   allopurinol 100 MG tablet Commonly known as: ZYLOPRIM Take 100 mg by mouth daily.   Alpha-Lipoic Acid 600 MG Caps Take 600 mg by mouth daily.   ammonium lactate 12 % cream Commonly known as: AMLACTIN Apply topically as needed for dry skin. What changed:   how much to take  when to take this   atorvastatin 10 MG tablet Commonly known as: LIPITOR TAKE 1 TABLET BY MOUTH DAILY   azelastine 0.1 % nasal spray Commonly  known as: ASTELIN USE 2 SPRAYS IN EACH NOSTRIL DAILY AS DIRECTED What changed: See the new instructions.   BEANO PO Take 2-3 capsules by mouth 3 (three) times daily with meals.   CALCIUM-MAGNESIUM PO Take 1 tablet by mouth 2 (two) times daily.   cephALEXin 500 MG capsule Commonly known as: KEFLEX Take 500 mg by mouth 3 (three) times daily. 10 DS   cetirizine 10 MG tablet Commonly known as: ZYRTEC Take 10 mg by mouth daily.   cholecalciferol 25 MCG (1000 UNIT) tablet Commonly known as: VITAMIN D Take 1,000 Units by mouth daily.   cyproheptadine 4 MG tablet Commonly known as: PERIACTIN TAKE 2 TABLETS BY MOUTH EVERY EVENING   denosumab 60 MG/ML Soln injection Commonly known as: PROLIA Inject 60 mg into the skin every 6 (six) months. Administer in upper arm, thigh, or abdomen   Dexilant 60 MG capsule Generic drug: dexlansoprazole TAKE 1 CAPSULE BY MOUTH EACH MORNING What changed: See the new instructions.   diclofenac Sodium 1 % Gel Commonly known as: VOLTAREN APPLY 2 GRAMS TOPICALLY TWICE DAILY What changed:   how much to take  how to take this  when to  take this  reasons to take this  additional instructions   Dulera 200-5 MCG/ACT Aero Generic drug: mometasone-formoterol INHALE 2 PUFFS BY MOUTH TWICE DAILY   EPINEPHrine 0.3 mg/0.3 mL Soaj injection Commonly known as: EpiPen 2-Pak Inject 0.3 mLs (0.3 mg total) into the muscle Once PRN. What changed:   when to take this  reasons to take this   famotidine 20 MG tablet Commonly known as: PEPCID TAKE 1 TABLET BY MOUTH TWICE A DAY   fluticasone 50 MCG/ACT nasal spray Commonly known as: FLONASE Place 2 sprays into both nostrils daily.   Flutter Devi Use as directed   folic acid 1 MG tablet Commonly known as: FOLVITE Take 1 mg by mouth daily.   furosemide 40 MG tablet Commonly known as: LASIX TAKE 1 TABLET BY MOUTH DAILY AS NEEDED What changed:   how much to take  when to take  this  additional instructions   gabapentin 600 MG tablet Commonly known as: NEURONTIN TAKE 1 TABLET BY MOUTH DAILY   Gerhardt's butt cream Crea Apply 1 application topically daily as needed (hemorrhoids). NYSTATIN 100,000 UNIT/GM CR 100000 CRM- 60 g, ZINC OXIDE 20 % OINT 20 OINT- 60 g, HYDROCORTISONE 1 % CREAM- 60 g   guaiFENesin 600 MG 12 hr tablet Commonly known as: MUCINEX Take 600 mg by mouth 2 (two) times daily.   hydrocortisone cream 0.5 % APPLY TO THE AFFECTED AREA(S) AS DIRECTED   KERASAL EX Apply 1 application topically at bedtime.   Lactase 9000 units Tabs Take 18,000-27,000 Units by mouth 4 (four) times daily as needed (dairy consumption).   leflunomide 20 MG tablet Commonly known as: ARAVA Take 20 mg by mouth daily.   lidocaine 5 % Commonly known as: LIDODERM Place 1 patch onto the skin daily as needed (pain). Remove & Discard patch within 12 hours or as directed by MD   mirtazapine 30 MG disintegrating tablet Commonly known as: REMERON SOL-TAB DISSOLVE 1 TABLET BY MOUTH EVERY NIGHT AT BEDTIME What changed: See the new instructions.   Mitigare 0.6 MG Caps Generic drug: Colchicine Take 0.6 mg by mouth daily as needed (inflammation & pain).   montelukast 10 MG tablet Commonly known as: SINGULAIR TAKE 1 TABLET BY MOUTH DAILY What changed: when to take this   mupirocin ointment 2 % Commonly known as: BACTROBAN Apply 1 application topically 2 (two) times daily. What changed: when to take this   nystatin 100000 UNIT/ML suspension Commonly known as: MYCOSTATIN SWISH AND SPIT 5 MLS TWICE DAILY What changed:   how much to take  how to take this  when to take this  additional instructions   nystatin cream Commonly known as: MYCOSTATIN Apply 1 application topically 2 (two) times daily.   nystatin-triamcinolone ointment Commonly known as: MYCOLOG Apply topically 2 (two) times daily. What changed:   how much to take  when to take  this  reasons to take this   ondansetron 4 MG tablet Commonly known as: Zofran Take 1 tablet (4 mg total) by mouth every 8 (eight) hours as needed for nausea or vomiting.   predniSONE 5 MG tablet Commonly known as: DELTASONE Take 15 mg by mouth daily with breakfast.   prenatal multivitamin Tabs tablet Take 1 tablet by mouth daily.   sodium chloride HYPERTONIC 3 % nebulizer solution Take by nebulization 2 (two) times daily. What changed: how much to take   Spiriva Respimat 2.5 MCG/ACT Aers Generic drug: Tiotropium Bromide Monohydrate Inhale 1 puff into the lungs daily.  Systane Complete 0.6 % Soln Generic drug: Propylene Glycol Place 1 drop into both eyes 2 (two) times daily.   traMADol 50 MG tablet Commonly known as: Ultram Take 1 tablet (50 mg total) by mouth every 6 (six) hours as needed. What changed: reasons to take this   Xarelto 20 MG Tabs tablet Generic drug: rivaroxaban TAKE 1 TABLET BY MOUTH DAILY WITH SUPPER What changed: See the new instructions.            Durable Medical Equipment  (From admission, onward)         Start     Ordered   02/29/20 1154  DME 3-in-1  Once        02/29/20 1157          Follow-up Information    [name], MD Follow up.   Specialty: Family Medicine Contact information: [address] [place] [address] [phone]        Buford Dresser, MD .   Specialty: Cardiology Contact information: [address] [place] [address] [phone]              Allergies  Allergen Reactions  . Dust Mite Extract Shortness Of Breath and Other (See Comments)    "sneezing" (02/20/2012)  . Molds & Smuts Shortness Of Breath  . Morphine And Related Hives and Itching  . Other Shortness Of Breath and Other (See Comments)    Grass and weeds "sneezing; filled sinuses" (02/20/2012)  . Penicillins Rash and Other (See Comments)    "welts" (02/20/2012) Has patient had a PCN reaction  causing immediate rash, facial/tongue/throat swelling, SOB or lightheadedness with hypotension: Unknown Has patient had a PCN reaction causing severe rash involving mucus membranes or skin necrosis: No Has patient had a PCN reaction that required hospitalization: No Has patient had a PCN reaction occurring within the last 10 years: No If all of the above answers are "NO", then may proceed with Cephalosporin use. Tolerated Ancef 02/16/20.   Marland Kitchen Reclast [Zoledronic Acid] Other (See Comments)    Fever, Put in hospital, dr said it was a reaction from a reaction   . Rofecoxib Swelling    Vioxx REACTION: feet swelling  . Shellfish Allergy Anaphylaxis, Shortness Of Breath, Itching and Rash  . Tetracycline Hcl Nausea And Vomiting  . Xolair [Omalizumab] Other (See Comments)    Caused Blood clot  . Dilaudid [Hydromorphone Hcl] Itching  . Hydrocodone-Acetaminophen Nausea And Vomiting  . Levofloxacin Other (See Comments)    GI upset  . Oxycodone Hcl Nausea And Vomiting  . Paroxetine Nausea And Vomiting    Paxil   . Baclofen Other (See Comments)    Altered mental status requiring 3 day hospital stay   . Celecoxib Swelling    Feet swelling  . Diltiazem Swelling  . Lactose Intolerance (Gi) Other (See Comments)    Bloating and gas  . Tree Extract Other (See Comments)    "tested and told I was allergic to it; never experienced a reaction to it" (02/20/2012)    Consultations:  Orthopedics  Neurology   Procedures/Studies: EEG  Result Date: 02/24/2020 Lora Havens, MD     02/24/2020  8:53 AM Patient Name: Ebony Scott MRN: [id number] Epilepsy Attending: Lora Havens Referring Physician/Provider: Delia Heady, PA Date: 02/23/2019 Duration: 23.43 minutes Patient history: 70 year old female with altered mental status. EEG to evaluate for seizures. Level of alertness: Awake AEDs during EEG study: Gabapentin Technical aspects: This EEG study was  done with scalp electrodes positioned according to the  10-20 International system of electrode placement. Electrical activity was acquired at a sampling rate of 500Hz  and reviewed with a high frequency filter of 70Hz  and a low frequency filter of 1Hz . EEG data were recorded continuously and digitally stored. Description: No clear posterior dominant rhythm was seen. EEG showed continuous generalized 3 to 5 Hz theta-delta slowing. Triphasic waves, generalized, maximal bifrontal were noted. Hyperventilation and photic stimulation were not performed.   ABNORMALITY -Continuous slow, generalized -Triphasic waves, generalized IMPRESSION: This study is suggestive of moderate diffuse encephalopathy, nonspecific etiology but could be secondary to toxic, metabolic causes. No seizures or definite epileptiform discharges were seen throughout the recording. Lora Havens   DG Chest 1 View  Result Date: 02/27/2020 CLINICAL DATA:  Shortness of breath EXAM: CHEST  1 VIEW COMPARISON:  February 24, 2020 FINDINGS: Haziness over the lung bases favored represent layering effusions and underlying opacities. The heart, hila, and mediastinum are normal. No pneumothorax. No other acute abnormalities. IMPRESSION: Layering effusions with underlying opacities, likely atelectasis. No other acute abnormalities. Electronically Signed   By: Dorise Bullion III M.D   On: 02/27/2020 11:37   DG Chest 2 View  Result Date: 02/18/2020 CLINICAL DATA:  Shortness of breath EXAM: CHEST - 2 VIEW COMPARISON:  CT chest dated 08/28/2019 FINDINGS: Mild right basilar atelectasis. Left lung is clear. No pleural effusion or pneumothorax. Mild eventration of the right hemidiaphragm. Heart is normal in size. Right shoulder arthroplasty. Healing left posterolateral 6th and 8th rib fractures. Postprocedural changes in the left breast. Cholecystectomy clips. IMPRESSION: Mild right basilar atelectasis. Healing left rib fractures. Electronically Signed   By: Julian Hy M.D.   On: 02/18/2020 13:28   CT HEAD WO  CONTRAST  Result Date: 02/23/2020 CLINICAL DATA:  Trauma.  Fall. EXAM: CT HEAD WITHOUT CONTRAST CT CERVICAL SPINE WITHOUT CONTRAST TECHNIQUE: Multidetector CT imaging of the head and cervical spine was performed following the standard protocol without intravenous contrast. Multiplanar CT image reconstructions of the cervical spine were also generated. COMPARISON:  Radiographs October 09, 2013. CT head March 06, 2018. FINDINGS: CT HEAD FINDINGS Brain: No evidence of acute large vascular territory infarction, hemorrhage, hydrocephalus, extra-axial collection or mass lesion/mass effect. Patchy white matter hypoattenuation, most likely related to chronic microvascular ischemic disease. Mild generalized cerebral volume loss with ex vacuo ventricular dilation. Vascular: Calcific atherosclerosis. Skull: No acute fracture. Sinuses/Orbits: Ethmoid air cell mucosal thickening without air-fluid levels. Unremarkable orbits. Other: No mastoid effusions. CT CERVICAL SPINE FINDINGS Nondiagnostic evaluation at the craniocervical junction secondary to motion. Within this limitation: Alignment: There is approximately 5 mm of anterolisthesis of C4 on C5, favored chronic/degenerative given anterolisthesis on prior radiographs from October 09, 2013. Otherwise, no substantial sagittal subluxation. Skull base and vertebrae: Osseous fusion of the C6-C7 vertebral bodies. ACDF spanning C3-C4. No evidence of acute fracture. Vertebral body heights are maintained. Post cerclage wires at C6-C7. Soft tissues and spinal canal: No visible prevertebral fluid or swelling. No visible canal hematoma, although evaluation of the canal is limited by motion. Disc levels: Multilevel facet hypertrophy. Multilevel mild degenerative disc disease. Upper chest: Negative. IMPRESSION: 1. No evidence of acute intracranial abnormality. 2. No evidence of acute cervical spine fracture, although evaluation at the craniocervical junction is nondiagnostic secondary to  motion. 3. Approximately 5 mm of anterolisthesis of C4 on C5, favored chronic/degenerative given anterolisthesis on prior radiographs from October 09, 2013. Electronically Signed   By: Margaretha Sheffield MD   On:  02/23/2020 10:57   CT CERVICAL SPINE WO CONTRAST  Result Date: 02/23/2020 CLINICAL DATA:  Trauma.  Fall. EXAM: CT HEAD WITHOUT CONTRAST CT CERVICAL SPINE WITHOUT CONTRAST TECHNIQUE: Multidetector CT imaging of the head and cervical spine was performed following the standard protocol without intravenous contrast. Multiplanar CT image reconstructions of the cervical spine were also generated. COMPARISON:  Radiographs October 09, 2013. CT head March 06, 2018. FINDINGS: CT HEAD FINDINGS Brain: No evidence of acute large vascular territory infarction, hemorrhage, hydrocephalus, extra-axial collection or mass lesion/mass effect. Patchy white matter hypoattenuation, most likely related to chronic microvascular ischemic disease. Mild generalized cerebral volume loss with ex vacuo ventricular dilation. Vascular: Calcific atherosclerosis. Skull: No acute fracture. Sinuses/Orbits: Ethmoid air cell mucosal thickening without air-fluid levels. Unremarkable orbits. Other: No mastoid effusions. CT CERVICAL SPINE FINDINGS Nondiagnostic evaluation at the craniocervical junction secondary to motion. Within this limitation: Alignment: There is approximately 5 mm of anterolisthesis of C4 on C5, favored chronic/degenerative given anterolisthesis on prior radiographs from October 09, 2013. Otherwise, no substantial sagittal subluxation. Skull base and vertebrae: Osseous fusion of the C6-C7 vertebral bodies. ACDF spanning C3-C4. No evidence of acute fracture. Vertebral body heights are maintained. Post cerclage wires at C6-C7. Soft tissues and spinal canal: No visible prevertebral fluid or swelling. No visible canal hematoma, although evaluation of the canal is limited by motion. Disc levels: Multilevel facet hypertrophy.  Multilevel mild degenerative disc disease. Upper chest: Negative. IMPRESSION: 1. No evidence of acute intracranial abnormality. 2. No evidence of acute cervical spine fracture, although evaluation at the craniocervical junction is nondiagnostic secondary to motion. 3. Approximately 5 mm of anterolisthesis of C4 on C5, favored chronic/degenerative given anterolisthesis on prior radiographs from October 09, 2013. Electronically Signed   By: Margaretha Sheffield MD   On: 02/23/2020 10:57   MR BRAIN WO CONTRAST  Result Date: 02/25/2020 CLINICAL DATA:  Delirium EXAM: MRI HEAD WITHOUT CONTRAST TECHNIQUE: Multiplanar, multiecho pulse sequences of the brain and surrounding structures were obtained without intravenous contrast. COMPARISON:  02/23/2020 and prior. FINDINGS: Examination was terminated early secondary to patient disposition. Please note that motion artifact and limited sequence acquisition limits evaluation. Brain: No focal diffusion-weighted signal abnormality. No intracranial hemorrhage. No midline shift, ventriculomegaly or extra-axial fluid collection. No mass lesion. Mild cerebral atrophy with ex vacuo dilatation. Mild chronic microvascular ischemic changes. Vascular: Not well evaluated on this exam. Skull and upper cervical spine: Normal marrow signal. Sinuses/Orbits: Normal orbits. Clear paranasal sinuses. No mastoid effusion. Other: None. IMPRESSION: No acute intracranial process. Mild cerebral atrophy and chronic microvascular ischemic changes. Please note that limited sequence acquisition and motion artifact limits evaluation. Electronically Signed   By: Primitivo Gauze M.D.   On: 02/25/2020 15:14   CT SHOULDER RIGHT WO CONTRAST  Result Date: 02/09/2020 CLINICAL DATA:  Right shoulder pain, limited range of motion. History of breast cancer. History of rheumatoid arthritis. EXAM: CT OF THE UPPER RIGHT EXTREMITY WITHOUT CONTRAST TECHNIQUE: Multidetector CT imaging of the upper right extremity was  performed according to the standard protocol. COMPARISON:  X-ray 12/17/2019, MRI 01/13/2020.  Chest CT 08/28/2019 FINDINGS: Bones/Joint/Cartilage Marked bone loss of the humeral head with smooth bony remodeling. Loss of approximately 50% of the bony substance of the humeral head including the full surface area of the articular surface. There is also bone loss of the inferior glenoid involving at least 25% of the inferior glenoid articular surface. Very large glenohumeral joint effusion with innumerable small mineralized densities. Collection tracks along the posterior margin of the  scapula. Approximate measurements of the collection measure at least 13 x 7 x 7 cm. There is a curvilinear fragment of bone within the axillary pouch measuring 2.0 x 0.5 cm. No evidence of an acute fracture or dislocation. Mild degenerative changes of the acromioclavicular joint. There is fluid distension of the subacromial-subdeltoid bursal space also containing numerous small mineralized densities. Remaining visualized osseous structures appear grossly intact. Ligaments Suboptimally assessed by CT. Muscles and Tendons Fatty atrophy of the subscapularis muscle. Mild fatty infiltration within the supraspinatus muscle. Known rotator cuff tears are not well demonstrated by CT. Soft tissues No axillary lymphadenopathy. Irregular pleural based pulmonary nodule in the lateral aspect of the right upper lobe measuring approximately 8 mm (series 3, image 59). This is new from prior CT 08/28/2019. Scattered surrounding tree-in-bud opacities are present within this area. IMPRESSION: 1. Destructive arthropathy of the glenohumeral joint with rapid bone loss involving the majority of the humeral head and inferior aspect of the glenoid. Findings may reflect a crystalline arthropathy such as [place] shoulder, inflammatory arthropathy such as RA given the patient's history, neuropathic joint, or less likely septic arthritis. Arthrocentesis with joint  fluid analysis is recommended. 2. Very large glenohumeral joint effusion with innumerable small mineralized densities. Fluid distension of the subacromial-subdeltoid bursal space also containing numerous small mineralized densities. 3. Interval development of irregular pleural based nodule in the periphery of the right upper lobe measuring 8 mm. Non-contrast chest CT at 6-12 months is recommended. If the nodule is stable at time of repeat CT, then future CT at 18-24 months (from today's scan) is considered optional for low-risk patients, but is recommended for high-risk patients. This recommendation follows the consensus statement: Guidelines for Management of Incidental Pulmonary Nodules Detected on CT Images: From the Fleischner Society 2017; Radiology 2017; 284:228-243. These results will be called to the ordering clinician or representative by the Radiologist Assistant, and communication documented in the PACS or [employer]. Electronically Signed   By: Davina Poke D.O.   On: 02/09/2020 08:58   DG Pelvis Portable  Result Date: 02/23/2020 CLINICAL DATA:  Unwitnessed fall EXAM: PORTABLE PELVIS 1-2 VIEWS COMPARISON:  None. FINDINGS: There is no evidence of pelvic fracture or dislocation. Mild symmetric narrowing of each hip joint noted. Degenerative change noted in visualized lower lumbar spine. No erosive change. IMPRESSION: Mild narrowing each hip joint.  No fracture or dislocation. Electronically Signed   By: Lowella Grip III M.D.   On: 02/23/2020 10:21   DG CHEST PORT 1 VIEW  Result Date: 02/24/2020 CLINICAL DATA:  Systemic inflammatory response syndrome EXAM: PORTABLE CHEST 1 VIEW COMPARISON:  02/23/2020 FINDINGS: Stable mild elevation of the right hemidiaphragm. Mild bibasilar atelectasis. No pneumothorax or pleural effusion. Cardiac size is within normal limits. No acute bone abnormality. Right total shoulder arthroplasty and cervical cerclage wire are noted. Multiple surgical clips  surrounding and area of dystrophic calcification within the left breast. IMPRESSION: Mild bibasilar atelectasis.  No interval change. Electronically Signed   By: Fidela Salisbury MD   On: 02/24/2020 04:56   DG Chest Port 1 View  Result Date: 02/23/2020 CLINICAL DATA:  Trauma.  Found on floor. EXAM: PORTABLE CHEST 1 VIEW COMPARISON:  02/18/2020 FINDINGS: Cardiac and mediastinal contours normal. Vascularity normal. Mild atelectasis in the bases. No effusion. Apical pleural thickening bilaterally. Right shoulder replacement.  Prior cervical spine for surgery. IMPRESSION: Mild bibasilar atelectasis. Electronically Signed   By: Franchot Gallo M.D.   On: 02/23/2020 10:21   DG Shoulder Right Port  Result Date: 02/29/2020 CLINICAL DATA:  Right shoulder pain. Patient status post reverse shoulder arthroplasty 02/16/2020. EXAM: PORTABLE RIGHT SHOULDER COMPARISON:  Plain films right shoulder 02/16/2020 and 02/23/2020. CT shoulder 02/08/2020. FINDINGS: Reverse shoulder arthroplasty remains in place. The device is located. No fracture. Soft tissue calcifications are unchanged. IMPRESSION: Status post right shoulder arthroplasty.  No acute finding. Electronically Signed   By: Inge Rise M.D.   On: 02/29/2020 08:29   DG Shoulder Right Port  Result Date: 02/23/2020 CLINICAL DATA:  Pain following fall EXAM: PORTABLE RIGHT SHOULDER: 2 + V COMPARISON:  February 16, 2020. FINDINGS: Oblique, frontal, and Y scapular images were obtained. 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 No acute fracture or evident dislocation. Extensive bursal calcification laterally appear stable. No erosion. Visualized right lung clear. IMPRESSION: Total shoulder replacement with prosthetic components appearing well-seated. No acute fracture or dislocation. Extensive bursal calcification is stable. Electronically Signed   By: Lowella Grip III M.D.   On: 02/23/2020 10:24   DG Shoulder Right  Port  Result Date: 02/16/2020 CLINICAL DATA:  Status post right shoulder replacement today. EXAM: PORTABLE RIGHT SHOULDER COMPARISON:  Plain films right shoulder 12/17/2019. FINDINGS: New reverse shoulder arthroplasty is in place. The device is located. There is no fracture. Calcifications over the greater tuberosity correlate with bursal calcifications seen on prior CT. IMPRESSION: Status post right shoulder arthroplasty.  No acute finding. Electronically Signed   By: Inge Rise M.D.   On: 02/16/2020 14:04   DG Foot Complete Left  Result Date: 02/15/2020 Please see detailed radiograph report in office note.  DG Foot Complete Right  Result Date: 02/15/2020 Please see detailed radiograph report in office note.     Subjective:   Discharge Exam: Vitals:   02/29/20 0857 02/29/20 0948  BP:  125/68  Pulse:    Resp:  16  Temp:  97.9 F (36.6 C)  SpO2: 93% 96%   Vitals:   02/28/20 2113 02/29/20 0532 02/29/20 0857 02/29/20 0948  BP:  (!) 152/94  125/68  Pulse:  (!) 103    Resp:  20  16  Temp:  98.1 F (36.7 C)  97.9 F (36.6 C)  TempSrc:  Oral  Oral  SpO2: 97% 95% 93% 96%    General: Pt is alert, awake, not in acute distress Cardiovascular: RRR, S1/S2 +, no rubs, no gallops Respiratory: CTA bilaterally, no wheezing, no rhonchi Abdominal: Soft, NT, ND, bowel sounds + Extremities: no edema, no cyanosis    The results of significant diagnostics from this hospitalization (including imaging, microbiology, ancillary and laboratory) are listed below for reference.     Microbiology: Recent Results (from the past 240 hour(s))  Resp Panel by RT-PCR (Flu A&B, Covid) Nasopharyngeal Swab     Status: None   Collection Time: 02/23/20 12:08 PM   Specimen: Nasopharyngeal Swab; Nasopharyngeal(NP) swabs in vial transport medium  Result Value Ref Range Status   SARS Coronavirus 2 by RT PCR NEGATIVE NEGATIVE Final    Comment: (NOTE) SARS-CoV-2 target nucleic acids are NOT  DETECTED.  The SARS-CoV-2 RNA is generally detectable in upper respiratory specimens during the acute phase of infection. The lowest concentration of SARS-CoV-2 viral copies this assay can detect is 138 copies/mL. A negative result does not preclude SARS-Cov-2 infection and should not be used as the sole basis for treatment or other patient management decisions. A negative result may occur with  improper specimen collection/handling, submission of specimen other than nasopharyngeal swab, presence of  viral mutation(s) within the areas targeted by this assay, and inadequate number of viral copies(<138 copies/mL). A negative result must be combined with clinical observations, patient history, and epidemiological information. The expected result is Negative.  Fact Sheet for Patients:  EntrepreneurPulse.com.au  Fact Sheet for Healthcare Providers:  IncredibleEmployment.be  This test is no t yet approved or cleared by the [place] FDA and  has been authorized for detection and/or diagnosis of SARS-CoV-2 by FDA under an Emergency Use Authorization (EUA). This EUA will remain  in effect (meaning this test can be used) for the duration of the COVID-19 declaration under Section 564(b)(1) of the Act, 21 U.S.C.section 360bbb-3(b)(1), unless the authorization is terminated  or revoked sooner.       Influenza A by PCR NEGATIVE NEGATIVE Final   Influenza B by PCR NEGATIVE NEGATIVE Final    Comment: (NOTE) The Xpert Xpress SARS-CoV-2/FLU/RSV plus assay is intended as an aid in the diagnosis of influenza from Nasopharyngeal swab specimens and should not be used as a sole basis for treatment. Nasal washings and aspirates are unacceptable for Xpert Xpress SARS-CoV-2/FLU/RSV testing.  Fact Sheet for Patients: EntrepreneurPulse.com.au  Fact Sheet for Healthcare Providers: IncredibleEmployment.be  This test is not yet  approved or cleared by the [place] FDA and has been authorized for detection and/or diagnosis of SARS-CoV-2 by FDA under an Emergency Use Authorization (EUA). This EUA will remain in effect (meaning this test can be used) for the duration of the COVID-19 declaration under Section 564(b)(1) of the Act, 21 U.S.C. section 360bbb-3(b)(1), unless the authorization is terminated or revoked.  Performed at [facility], [place] [address], [place], [place] [address]   Culture, blood (routine x 2)     Status: None   Collection Time: 02/23/20  2:40 PM   Specimen: BLOOD  Result Value Ref Range Status   Specimen Description BLOOD SITE NOT SPECIFIED  Final   Special Requests   Final    BOTTLES DRAWN AEROBIC AND ANAEROBIC Blood Culture adequate volume   Culture   Final    NO GROWTH 5 DAYS Performed at [facility], [place] [address], [place], [place] [address]    Report Status 02/28/2020 FINAL  Final  Culture, blood (routine x 2)     Status: None   Collection Time: 02/23/20 11:39 PM   Specimen: BLOOD  Result Value Ref Range Status   Specimen Description BLOOD RIGHT ANTECUBITAL  Final   Special Requests   Final    BOTTLES DRAWN AEROBIC AND ANAEROBIC Blood Culture results may not be optimal due to an excessive volume of blood received in culture bottles   Culture   Final    NO GROWTH 5 DAYS Performed at [facility], [place] [address], [place], [place] [address]    Report Status 02/29/2020 FINAL  Final  MRSA PCR Screening     Status: None   Collection Time: 02/24/20  4:35 PM   Specimen: Nasal Mucosa; Nasopharyngeal  Result Value Ref Range Status   MRSA by PCR NEGATIVE NEGATIVE Final    Comment:        The GeneXpert MRSA Assay (FDA approved for NASAL specimens only), is one component of a comprehensive MRSA colonization surveillance program. It is not intended to diagnose MRSA infection nor to guide or monitor treatment for MRSA infections. Performed at [facility], [place] [address], [place], [place] [address]      Labs: BNP (last 3 results) Recent Labs  05/07/19 0953  BNP 41   Basic Metabolic Panel: Recent Labs  Lab 02/23/20 0944 02/23/20 1018 02/23/20 1942 02/24/20 0517 02/26/20 0732 02/27/20 0143 02/28/20 0330  NA 138 136 139 141 147* 143 143  K 4.0 3.4* 3.3* 3.9 3.7 4.0 4.3  CL 96* 101  --  106 115* 111 114*  CO2 24  --   --  19* 18* 18* 19*  GLUCOSE 187* 174*  --  138* 125* 116* 95  BUN 48* 44*  --  29* 13 12 14   CREATININE 1.52* 1.40*  --  1.25* 1.03* 1.07* 1.12*  CALCIUM 9.3  --   --  8.6* 8.0* 8.3* 8.5*  MG  --   --   --   --  2.0 1.9 1.9  PHOS  --   --   --   --  2.2* 3.3 2.3*   Liver Function Tests: Recent Labs  Lab 02/23/20 0944 02/24/20 0517 02/27/20 0143  AST 67* 63* 63*  ALT 115* 89* 71*  ALKPHOS 71 67 64  BILITOT 1.5* 1.9* 0.8  PROT 6.1* 5.4* 5.8*  ALBUMIN 2.9* 2.4* 2.4*   No results for input(s): LIPASE, AMYLASE in the last 168 hours. Recent Labs  Lab 02/23/20 1242  AMMONIA 19   CBC: Recent Labs  Lab 02/23/20 2326 02/24/20 0517 02/26/20 0732 02/27/20 0143 02/28/20 0330  WBC 9.7 10.4 13.1* 12.2* 12.1*  NEUTROABS 7.8*  --   --   --   --   HGB 9.4* 11.2* 9.4* 10.4* 10.0*  HCT 28.7* 35.5* 30.6* 32.4* 31.5*  MCV 107.9* 110.9* 110.9* 109.5* 109.8*  PLT 182 170 196 194 196   Cardiac Enzymes: Recent Labs  Lab 02/23/20 1234  CKTOTAL 468*   Urinalysis    Component Value Date/Time   COLORURINE YELLOW 02/23/2020 1300   APPEARANCEUR CLEAR 02/23/2020 1300   LABSPEC 1.014 02/23/2020 1300   PHURINE 6.0 02/23/2020 1300   GLUCOSEU NEGATIVE 02/23/2020 1300   HGBUR NEGATIVE 02/23/2020 1300   BILIRUBINUR NEGATIVE 02/23/2020 1300   KETONESUR NEGATIVE 02/23/2020 1300   PROTEINUR NEGATIVE 02/23/2020 1300   UROBILINOGEN 0.2 05/17/2014 1113   NITRITE NEGATIVE 02/23/2020 1300   LEUKOCYTESUR NEGATIVE 02/23/2020 1300   Sepsis Labs Invalid input(s): PROCALCITONIN,  WBC,   LACTICIDVEN Microbiology Recent Results (from the past 240 hour(s))  Resp Panel by RT-PCR (Flu A&B, Covid) Nasopharyngeal Swab     Status: None   Collection Time: 02/23/20 12:08 PM   Specimen: Nasopharyngeal Swab; Nasopharyngeal(NP) swabs in vial transport medium  Result Value Ref Range Status   SARS Coronavirus 2 by RT PCR NEGATIVE NEGATIVE Final    Comment: (NOTE) SARS-CoV-2 target nucleic acids are NOT DETECTED.  The SARS-CoV-2 RNA is generally detectable in upper respiratory specimens during the acute phase of infection. The lowest concentration of SARS-CoV-2 viral copies this assay can detect is 138 copies/mL. A negative result does not preclude SARS-Cov-2 infection and should not be used as the sole basis for treatment or other patient management decisions. A negative result may occur with  improper specimen collection/handling, submission of specimen other than nasopharyngeal swab, presence of viral mutation(s) within the areas targeted by this assay, and inadequate number of viral copies(<138 copies/mL). A negative result must be combined with clinical observations, patient history, and epidemiological information. The expected result is Negative.  Fact Sheet for Patients:  EntrepreneurPulse.com.au  Fact Sheet for Healthcare Providers:  IncredibleEmployment.be  This test is no t yet approved or cleared by the [place] FDA  and  has been authorized for detection and/or diagnosis of SARS-CoV-2 by FDA under an Emergency Use Authorization (EUA). This EUA will remain  in effect (meaning this test can be used) for the duration of the COVID-19 declaration under Section 564(b)(1) of the Act, 21 U.S.C.section 360bbb-3(b)(1), unless the authorization is terminated  or revoked sooner.       Influenza A by PCR NEGATIVE NEGATIVE Final   Influenza B by PCR NEGATIVE NEGATIVE Final    Comment: (NOTE) The Xpert Xpress SARS-CoV-2/FLU/RSV plus  assay is intended as an aid in the diagnosis of influenza from Nasopharyngeal swab specimens and should not be used as a sole basis for treatment. Nasal washings and aspirates are unacceptable for Xpert Xpress SARS-CoV-2/FLU/RSV testing.  Fact Sheet for Patients: EntrepreneurPulse.com.au  Fact Sheet for Healthcare Providers: IncredibleEmployment.be  This test is not yet approved or cleared by the [place] FDA and has been authorized for detection and/or diagnosis of SARS-CoV-2 by FDA under an Emergency Use Authorization (EUA). This EUA will remain in effect (meaning this test can be used) for the duration of the COVID-19 declaration under Section 564(b)(1) of the Act, 21 U.S.C. section 360bbb-3(b)(1), unless the authorization is terminated or revoked.  Performed at [facility], [place] [address], [place], [place] [address]   Culture, blood (routine x 2)     Status: None   Collection Time: 02/23/20  2:40 PM   Specimen: BLOOD  Result Value Ref Range Status   Specimen Description BLOOD SITE NOT SPECIFIED  Final   Special Requests   Final    BOTTLES DRAWN AEROBIC AND ANAEROBIC Blood Culture adequate volume   Culture   Final    NO GROWTH 5 DAYS Performed at [facility], [place] [address] [place], [place] [address]    Report Status 02/28/2020 FINAL  Final  Culture, blood (routine x 2)     Status: None   Collection Time: 02/23/20 11:39 PM   Specimen: BLOOD  Result Value Ref Range Status   Specimen Description BLOOD RIGHT ANTECUBITAL  Final   Special Requests   Final    BOTTLES DRAWN AEROBIC AND ANAEROBIC Blood Culture results may not be optimal due to an excessive volume of blood received in culture bottles   Culture   Final    NO GROWTH 5 DAYS Performed at [facility], [place] [address] [place], [place] [address]    Report Status 02/29/2020 FINAL  Final  MRSA PCR Screening     Status: None   Collection Time: 02/24/20   4:35 PM   Specimen: Nasal Mucosa; Nasopharyngeal  Result Value Ref Range Status   MRSA by PCR NEGATIVE NEGATIVE Final    Comment:        The GeneXpert MRSA Assay (FDA approved for NASAL specimens only), is one component of a comprehensive MRSA colonization surveillance program. It is not intended to diagnose MRSA infection nor to guide or monitor treatment for MRSA infections. Performed at [facility], [place] [address] [place], [place] [address]      Time coordinating discharge: Over 30 minutes  SIGNED:   Donnamae Jude, MD  [facility] 02/29/2020, 12:04 PM  If 7PM-7AM, please contact night-coverage

## 2020-02-29 NOTE — Progress Notes (Signed)
Physical Therapy Treatment Patient Details Name: Ebony Scott MRN: [id number] DOB: Nov 16, 1950 Today's Date: 02/29/2020    History of Present Illness 70yo female s/p R reverse shoulder arthroplasty 02/16/20, now presenting with AMS. Last seen normal 1/3, found on the morning of 1/4 by her daughter and was unresponsive. CTH negative for acute changes, imaging otherwise clear of acute injury. Admitted with acute metabolic encepthalopathy. PMH hx DVT, severe tracheobronchial malacia, CHF, CKD, osteoporosis, RA, breast CA in remission, fibromyalgia, PVD, cervical surgery    PT Comments    Patient received in bed, much more alert and with A&Ox4, even able to tell me she remembers me and another therapist that worked with her, and that "I woke up on Saturday".  Able to progress mobility today significantly, although she does require up to [place] to maintain balance with standing dynamic tasks due to multidirectional unsteadiness and gross weakness. Easily fatigued. Left up in recliner with all needs met, chair alarm active. Would still benefit from SNF, but is refusing and determined to return home- if she goes home, she will need HHPT, 24/7A, and I also recommend staying at a transfers only level to/from [facility] with assist until HHPT can improve her safety with gait and transfers, patient agreeable.    Follow Up Recommendations  Home health PT;Supervision/Assistance - 24 hour;Other (comment) (refusing SNF (even though she would very much benefit))     Equipment Recommendations  Wheelchair (measurements PT);Wheelchair cushion (measurements PT);3in1 (PT)    Recommendations for Other Services       Precautions / Restrictions Precautions Precautions: Shoulder Type of Shoulder Precautions: No AROM, NO PROM of R shoulder; elbow/wrist/hand AROM OK Shoulder Interventions: Shoulder sling/immobilizer;At all times;Off for dressing/bathing/exercises Precaution Booklet Issued: No Required Braces or Orthoses:  Sling Restrictions Weight Bearing Restrictions: Yes RUE Weight Bearing: Non weight bearing    Mobility  Bed Mobility Overal bed mobility: Needs Assistance Bed Mobility: Rolling;Sidelying to Sit Rolling: Modified independent (Device/Increase time)     Sit to supine: Supervision   General bed mobility comments: HOB mildly elevated, use of bed rails  Transfers Overall transfer level: Needs assistance Equipment used: 1 person hand held assist Transfers: Sit to/from Stand Sit to Stand: Min assist         General transfer comment: MinA to steady, initially had posterior lean and self selected wide BOS for balance  Ambulation/Gait Ambulation/Gait assistance: Mod assist Gait Distance (Feet): 18 Feet Assistive device: 1 person hand held assist Gait Pattern/deviations: Step-through pattern;Decreased step length - right;Decreased step length - left;Decreased stride length;Wide base of support;Trunk flexed;Trendelenburg Gait velocity: decreased   General Gait Details: slow and unsteady with multidirectional unsteadiness, needed up to [place] to maintain balance. Easily fatigued. Balance did improve with time up on feet but still very unsteady and quite weak   Chief Strategy Officer    Modified Rankin (Stroke Patients Only)       Balance Overall balance assessment: Needs assistance;History of Falls Sitting-balance support: Feet supported;Single extremity supported Sitting balance-Leahy Scale: Good Sitting balance - Comments: midline with close S   Standing balance support: Single extremity supported;During functional activity Standing balance-Leahy Scale: Poor Standing balance comment: as much as ModA to maintain dynamic balance due to multidirectional balance loss and weakness                            Cognition Arousal/Alertness: Awake/alert Behavior During Therapy:  Flat affect Overall Cognitive Status: Within Functional Limits for tasks  assessed Area of Impairment: Orientation;Attention;Memory;Following commands;Safety/judgement;Awareness;Problem solving                 Orientation Level: Person;Place;Time;Situation Current Attention Level: Selective   Following Commands: Follows one step commands consistently;Follows multi-step commands consistently;Follows multi-step commands with increased time   Awareness: Emergent Problem Solving: Difficulty sequencing;Requires verbal cues General Comments: much more alert and awake today- A&Ox4 and able to tell me she remembers me from last week and 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"      Exercises      General Comments        Pertinent Vitals/Pain Pain Assessment: No/denies pain Pain Score: 0-No pain Pain Intervention(s): Limited activity within patient's tolerance;Monitored during session    Home Living                      Prior Function            PT Goals (current goals can now be found in the care plan section) Acute Rehab PT Goals Patient Stated Goal: per daughter for her mother to get better PT Goal Formulation: With family Time For Goal Achievement: 03/09/20 Potential to Achieve Goals: Fair Progress towards PT goals: Progressing toward goals    Frequency    Min 3X/week      PT Plan Discharge plan needs to be updated    Co-evaluation              AM-PAC PT "6 Clicks" Mobility   Outcome Measure  Help needed turning from your back to your side while in a flat bed without using bedrails?: A Little Help needed moving from lying on your back to sitting on the side of a flat bed without using bedrails?: A Little Help needed moving to and from a bed to a chair (including a wheelchair)?: A Lot Help needed standing up from a chair using your arms (e.g., wheelchair or bedside chair)?: A Little Help needed to walk in hospital room?: A Lot Help needed climbing 3-5 steps with a railing? : Total 6 Click Score: 14    End of  Session Equipment Utilized During Treatment: Gait belt;Other (comment) (R shoulder sling) Activity Tolerance: Patient tolerated treatment well Patient left: in chair;with call bell/phone within reach;with chair alarm set Nurse Communication: Mobility status PT Visit Diagnosis: Unsteadiness on feet (R26.81);Difficulty in walking, not elsewhere classified (R26.2);History of falling (Z91.81);Muscle weakness (generalized) (M62.81);Other abnormalities of gait and mobility (R26.89)     Time: 0981-1914 PT Time Calculation (min) (ACUTE ONLY): 19 min  Charges:  $Gait Training: 8-22 mins                     Windell Norfolk, DPT, PN1   Supplemental Physical Therapist [place]    Pager [phone] Acute Rehab Office [phone]

## 2020-02-29 NOTE — Progress Notes (Signed)
Occupational Therapy Treatment Patient Details Name: Ebony Scott MRN: [id number] DOB: [birth year] Today's Date: 02/29/2020    History of present illness 70yo female s/p R reverse shoulder arthroplasty 02/16/20, now presenting with AMS. Last seen normal 1/3, found on the morning of 1/4 by her daughter and was unresponsive. CTH negative for acute changes, imaging otherwise clear of acute injury. Admitted with acute metabolic encepthalopathy. PMH hx DVT, severe tracheobronchial malacia, CHF, CKD, osteoporosis, RA, breast CA in remission, fibromyalgia, PVD, cervical surgery   OT comments  Pt progressing towards acute OT goals. Mobilized short distance in the room with up to mod A (turns) and +1 HHA. Reviewed shoulder education and fall prevention education. D/c plan remains appropriate from OT standpoint but noted pt has declined SNF and plans to d/c home with family assist.    Follow Up Recommendations  SNF;Supervision/Assistance - 24 hour    Equipment Recommendations  Wheelchair (measurements OT);Wheelchair cushion (measurements OT);3 in 1 bedside commode    Recommendations for Other Services      Precautions / Restrictions Precautions Precautions: Shoulder Type of Shoulder Precautions: No AROM, NO PROM of R shoulder; elbow/wrist/hand AROM OK Shoulder Interventions: Shoulder sling/immobilizer;At all times;Off for dressing/bathing/exercises Precaution Booklet Issued: Yes (comment) Required Braces or Orthoses: Sling Restrictions Weight Bearing Restrictions: Yes RUE Weight Bearing: Non weight bearing       Mobility Bed Mobility               General bed mobility comments: up in recliner  Transfers Overall transfer level: Needs assistance Equipment used: 1 person hand held assist Transfers: Sit to/from Stand Sit to Stand: Min assist         General transfer comment: to/from recliner. min A to steady. needs single extremity support for static standing    Balance  Overall balance assessment: Needs assistance;History of Falls Sitting-balance support: Feet supported;Single extremity supported Sitting balance-Leahy Scale: Good Sitting balance - Comments: midline with close S   Standing balance support: Single extremity supported;During functional activity Standing balance-Leahy Scale: Poor Standing balance comment: single extremity support in static standing                           ADL either performed or assessed with clinical judgement   ADL                           Toilet Transfer: Minimal assistance;Ambulation;BSC;Moderate assistance             General ADL Comments: +1 HHA to walk short distance in the room. Min A to steady, up to mod A on turns.     Vision       Perception     Praxis      Cognition Arousal/Alertness: Awake/alert Behavior During Therapy: Flat affect Overall Cognitive Status: Within Functional Limits for tasks assessed                                 General Comments: more alert and oriented today. flat affect.        Exercises Exercises: Other exercises Other Exercises Other Exercises: e/w/h in seated position at supervisionmin guard level   Shoulder Instructions       General Comments      Pertinent Vitals/ Pain       Pain Assessment: No/denies pain  Home Living  Prior Functioning/Environment              Frequency  Min 2X/week        Progress Toward Goals  OT Goals(current goals can now be found in the care plan section)  Progress towards OT goals: Progressing toward goals  Acute Rehab OT Goals Patient Stated Goal: per daughter for her mother to get better OT Goal Formulation: With family Time For Goal Achievement: 03/11/20 Potential to Achieve Goals: Fair ADL Goals Pt Will Perform Eating: with mod assist;sitting Pt Will Perform Grooming: with mod assist;sitting Additional ADL  Goal #1: Pt will sustain attention x 1 min during ADL task Additional ADL Goal #2: Pt will tolerate sitting in recliner x 30 min to increase activity tolerance for ADL tasks  Plan Discharge plan remains appropriate    Co-evaluation                 AM-PAC OT "6 Clicks" Daily Activity     Outcome Measure   Help from another person eating meals?: A Little Help from another person taking care of personal grooming?: A Little Help from another person toileting, which includes using toliet, bedpan, or urinal?: A Lot Help from another person bathing (including washing, rinsing, drying)?: A Lot Help from another person to put on and taking off regular upper body clothing?: A Little Help from another person to put on and taking off regular lower body clothing?: A Little 6 Click Score: 16    End of Session Equipment Utilized During Treatment: Other (comment) (+1 HHA, sling)  OT Visit Diagnosis: Unsteadiness on feet (R26.81);Other abnormalities of gait and mobility (R26.89);Muscle weakness (generalized) (M62.81);Other symptoms and signs involving cognitive function;Pain   Activity Tolerance Patient tolerated treatment well   Patient Left in chair;with call bell/phone within reach;with chair alarm set   Nurse Communication          Time: 3329-5188 OT Time Calculation (min): 14 min  Charges: OT General Charges $OT Visit: 1 Visit OT Treatments $Self Care/Home Management : 8-22 mins  Tyrone Schimke, OT Acute Rehabilitation Services Pager: [phone] Office: [phone]    Hortencia Pilar 02/29/2020, 1:56 PM

## 2020-02-29 NOTE — Discharge Instructions (Signed)
Delirium Delirium is a state of mental confusion. It comes on quickly and causes significant changes in a person's thinking and behavior. People with delirium usually have trouble paying attention to what is going on or knowing where they are. They may become very withdrawn or very emotional and unable to sit still. They may even see or feel things that are not there (hallucinations). Delirium is a sign of a serious underlying medical condition. What are the causes? Delirium occurs when something suddenly affects the signals that the brain sends out. Brain signals can be affected by anything that puts severe stress on the body and brain and causes brain chemicals to be out of balance. The most common causes of delirium include:  Infections. These may be bacterial, viral, fungal, or protozoal.  Medicines. These include many over-the-counter and prescription medicines.  Recreational drugs.  Substance withdrawal. This occurs with sudden discontinuation of alcohol, certain medicines, or recreational drugs.  Surgery and anesthesia.  Sudden vascular events, such as stroke and brain hemorrhage.  Other brain disorders, such as migraines, tumors, seizures, and physical head trauma.  Metabolic disorders, such as kidney or liver failure.  Low blood oxygen (anoxia). This may occur with lung disease, cardiac arrest, or carbon monoxide poisoning.  Hormone imbalances (endocrinopathies), such as an overactive thyroid (hyperthyroidism) or underactive thyroid (hypothyroidism).  Vitamin deficiencies. What increases the risk? The following factors may make someone more likely to develop this condition:  Being a child.  Being an older person.  Living alone.  Having vision loss or hearing loss.  Having an existing brain disease, such as dementia.  Having long-lasting (chronic) medical conditions, such as heart disease.  Being hospitalized for long periods of time. What are the signs or  symptoms? Delirium starts with a sudden change in a person's thinking or behavior. Symptoms include:  Not being able to stay awake (drowsiness) or pay attention.  Being confused about places, time, and people.  Forgetfulness.  Having extreme energy levels. These may be low or high.  Changes in sleep patterns.  Extreme mood swings, such as sudden anger or anxiety.  Focusing on things or ideas that are not important.  Rambling and senseless talking.  Difficulty speaking, understanding speech, or both.  Hallucinations.  Tremor or unsteady gait. Symptoms come and go throughout the day and are often worse at the end of the day. How is this diagnosed? People with delirium may not realize that they have the condition. Often, a family member or health care provider is the first person to notice the changes. This condition may be diagnosed based on a physical exam, health history, and tests.  The health care provider will obtain a detailed history. This may include questions about: ? Current symptoms. ? Medical conditions that you have. ? Medicines. ? Drug use.  The health care provider will perform a mental status test by: ? Asking questions to check for confusion. ? Watching for abnormal behavior.  The health care provider may also order lab tests or additional studies to determine the cause of the delirium. How is this treated? Treatment of delirium depends on the cause and severity. Delirium usually goes away within days or weeks of treating the underlying cause. In the meantime, do not leave the person alone because he or she may accidentally cause self-harm. This condition may be treated with supportive care, such as:  Increased light during the day and decreased light at night.  Low noise level.  Uninterrupted sleep.  A regular  daily schedule.  Clocks and calendars to help with orientation.  Familiar objects, including the person's pictures and clothing.  Frequent  visits from familiar family and friends.  A healthy diet.  Gentle exercise. In more severe cases of delirium, medicine may be prescribed to help the person keep calm and think more clearly.   Follow these instructions at home:  Continue supportive care as told by a health care provider.  Take over-the-counter and prescription medicines only as told by your health care provider.  Ask a health care provider before using herbs or supplements.  Do not use alcohol or illegal drugs.  Keep all follow-up visits. This is important.   Contact a health care provider if:  Symptoms do not get better or they become worse.  New symptoms of delirium develop.  Caring for the person at home does not seem safe.  Eating, drinking, or communicating stops.  There are side effects of medicines, such as changes in sleep patterns, dizziness, weight gain, restlessness, movement changes, or tremors. Get help right away if:  The person has thoughts of harming self or harming others.  There are serious side effects of medicine, such as: ? Swelling of the face, lips, tongue, or throat. ? Fever, confusion, muscle spasms, or seizures. If you ever feel like a loved one may hurt himself or herself or others, or shares thoughts about taking his or her own life, get help right away. You can go to your nearest emergency department or:  Call your local emergency services (911 in the [place]  Call a suicide crisis helpline, such as the [place] at 8043689667. This is open 24 hours a day in the [place]  Text the Crisis Text Line at 909-855-7261 (in the [place].). Summary  Delirium is a state of mental confusion. It comes on quickly and causes significant changes in a person's thinking and behavior.  Delirium is a sign of a serious underlying medical condition.  Certain medical conditions or a long hospital stay may increase the risk of developing delirium.  Treatment of delirium involves  treating the underlying cause and providing supportive treatments, such as a calm and familiar environment. This information is not intended to replace advice given to you by your health care provider. Make sure you discuss any questions you have with your health care provider. Document Revised: 05/15/2019 Document Reviewed: 05/15/2019 Elsevier Patient Education  [place] on my medicine - XARELTO (rivaroxaban)  This medication education was reviewed with me or my healthcare representative as part of my discharge preparation.  The pharmacist that spoke with me during my hospital stay was:  Onnie Boer, [place]? Xarelto was prescribed to treat blood clots that may have been found in the veins of your legs (deep vein thrombosis) or in your lungs (pulmonary embolism) and to reduce the risk of them occurring again.  What do you need to know about Xarelto? Continue Xarelto 20 mg taken ONCE A DAY with your evening meal.  DO NOT stop taking Xarelto without talking to the health care provider who prescribed the medication.  Refill your prescription for 20 mg tablets before you run out.  After discharge, you should have regular check-up appointments with your healthcare provider that is prescribing your Xarelto.  In the future your dose may need to be changed if your kidney function changes by a significant amount.  What do you do if you miss a dose? If you are  taking Xarelto TWICE DAILY and you miss a dose, take it as soon as you remember. You may take two 15 mg tablets (total 30 mg) at the same time then resume your regularly scheduled 15 mg twice daily the next day.  If you are taking Xarelto ONCE DAILY and you miss a dose, take it as soon as you remember on the same day then continue your regularly scheduled once daily regimen the next day. Do not take two doses of Xarelto at the same time.   Important Safety Information Xarelto is  a blood thinner medicine that can cause bleeding. You should call your healthcare provider right away if you experience any of the following: ? Bleeding from an injury or your nose that does not stop. ? Unusual colored urine (red or dark brown) or unusual colored stools (red or black). ? Unusual bruising for unknown reasons. ? A serious fall or if you hit your head (even if there is no bleeding).  Some medicines may interact with Xarelto and might increase your risk of bleeding while on Xarelto. To help avoid this, consult your healthcare provider or pharmacist prior to using any new prescription or non-prescription medications, including herbals, vitamins, non-steroidal anti-inflammatory drugs (NSAIDs) and supplements.  This website has more information on Xarelto: [email or web address]

## 2020-02-29 NOTE — Progress Notes (Signed)
     Subjective:  Patient alert and oriented this am. Complaining of no pain or paraesthesias.. Understands confusion due to baclofen. Eager to be discharged home today.  Objective:  PE: VITALS:   Vitals:   02/28/20 2016 02/28/20 2056 02/28/20 2113 02/29/20 0532  BP: (!) 152/82 (!) 149/82  (!) 152/94  Pulse: (!) 110 (!) 110  (!) 103  Resp: 18 20  20   Temp: 98.6 F (37 C) 98.3 F (36.8 C)  98.1 F (36.7 C)  TempSrc: Oral Oral  Oral  SpO2: 100% 98% 97% 95%   General: alert, oriented x 3, in no acute distress MSK: RUE in sling. Continued ecchymosis - significantly improved since admission. Able to flex and extend all fingers of right hand. Full ROM right wrist.   LABS  No results found. However, due to the size of the patient record, not all encounters were searched. Please check Results Review for a complete set of results.  DG Chest 1 View  Result Date: 02/27/2020 CLINICAL DATA:  Shortness of breath EXAM: CHEST  1 VIEW COMPARISON:  February 24, 2020 FINDINGS: Haziness over the lung bases favored represent layering effusions and underlying opacities. The heart, hila, and mediastinum are normal. No pneumothorax. No other acute abnormalities. IMPRESSION: Layering effusions with underlying opacities, likely atelectasis. No other acute abnormalities. Electronically Signed   By: Dorise Bullion III M.D   On: 02/27/2020 11:37    Assessment/Plan: S/p right reverse shoulder arthroplasty - NWB RUE, ok for AROM through elbow, wrist and hand.  continue sling at all times - dressing changed this morning. Patient instructed to keep incision dry for 3 weeks post operatively  - home Xarelto for dvt prophylaxis - will get repeat x-rays while inpatient today, patient can follow-up in our office in 2 weeks with Dr. Lars Masson 02/29/2020, 6:58 AM

## 2020-02-29 NOTE — Progress Notes (Signed)
Subjective: No acute events overnight.  No new concerns. States baclofen was a new med for her and doesn't want to take it again  ROS: negative except above  Examination  Vital signs in last 24 hours: Temp:  [97.9 F (36.6 C)-98.6 F (37 C)] 97.9 F (36.6 C) (01/10 0948) Pulse Rate:  [101-110] 103 (01/10 0532) Resp:  [16-20] 16 (01/10 0948) BP: (125-152)/(68-94) 125/68 (01/10 0948) SpO2:  [93 %-100 %] 96 % (01/10 0948)  General: lying in bed, not in apparent distress CVS: pulse-normal rate and rhythm RS: breathing comfortably, CTA B Extremities: normal, warm, RUE In brace Neuro:A)x2, follows commands, no aphasia, CN 2-12 grossly intact, spontaneously moving all extremities except RUE in brace  Basic Metabolic Panel: Recent Labs  Lab 02/23/20 0944 02/23/20 1018 02/23/20 1942 02/24/20 0517 02/26/20 0732 02/27/20 0143 02/28/20 0330  NA 138 136 139 141 147* 143 143  K 4.0 3.4* 3.3* 3.9 3.7 4.0 4.3  CL 96* 101  --  106 115* 111 114*  CO2 24  --   --  19* 18* 18* 19*  GLUCOSE 187* 174*  --  138* 125* 116* 95  BUN 48* 44*  --  29* 13 12 14   CREATININE 1.52* 1.40*  --  1.25* 1.03* 1.07* 1.12*  CALCIUM 9.3  --   --  8.6* 8.0* 8.3* 8.5*  MG  --   --   --   --  2.0 1.9 1.9  PHOS  --   --   --   --  2.2* 3.3 2.3*    CBC: Recent Labs  Lab 02/23/20 2326 02/24/20 0517 02/26/20 0732 02/27/20 0143 02/28/20 0330  WBC 9.7 10.4 13.1* 12.2* 12.1*  NEUTROABS 7.8*  --   --   --   --   HGB 9.4* 11.2* 9.4* 10.4* 10.0*  HCT 28.7* 35.5* 30.6* 32.4* 31.5*  MCV 107.9* 110.9* 110.9* 109.5* 109.8*  PLT 182 170 196 194 196     Coagulation Studies: No results for input(s): LABPROT, INR in the last 72 hours.  Imaging No new brain imaging overnight  ASSESSMENT AND PLAN: 70 year old female with recent right shoulder arthroplasty was brought in with altered mental status.  Acute encephalopathy, suspectedmedication induced, resolved -No evidence of infection, normal electrolytes,  EEG did not show any evidence of epileptogenicity, CT head did not show any acute abnormality. -Encephalopathy most likely due to medicationsin the setting of recent surgery in an elderly female  Recommendations: -Okay to resume home dose of gabapentin, HOLD baclofen -Counseled patient regarding effect of medications on her mentation specially in the setting of renal dysfunction -Delirium precautions -Management of rest of comorbidities per primary team -Okay to discharge from neurology standpoint  Thank you for allowing [place] to participate in the care of this patient. Neurology will sign off. Please call us if you have any further questions.  I have spent a total of68minuteswith the patient reviewing hospitalnotes, test results, labs and examining the patient as well as establishing an assessment and plan.>50% of time was spent in direct patient care.    Zeb Comfort Epilepsy [facility] For questions after 5pm please refer to AMION to reach the Neurologist on call

## 2020-03-01 ENCOUNTER — Inpatient Hospital Stay: Payer: Medicare PPO | Admitting: Adult Health

## 2020-03-01 DIAGNOSIS — Z96611 Presence of right artificial shoulder joint: Secondary | ICD-10-CM | POA: Diagnosis not present

## 2020-03-01 DIAGNOSIS — J9811 Atelectasis: Secondary | ICD-10-CM | POA: Diagnosis not present

## 2020-03-01 DIAGNOSIS — I739 Peripheral vascular disease, unspecified: Secondary | ICD-10-CM | POA: Diagnosis not present

## 2020-03-01 DIAGNOSIS — E0500 Thyrotoxicosis with diffuse goiter without thyrotoxic crisis or storm: Secondary | ICD-10-CM | POA: Diagnosis not present

## 2020-03-01 DIAGNOSIS — Z471 Aftercare following joint replacement surgery: Secondary | ICD-10-CM | POA: Diagnosis not present

## 2020-03-01 DIAGNOSIS — M0600 Rheumatoid arthritis without rheumatoid factor, unspecified site: Secondary | ICD-10-CM | POA: Diagnosis not present

## 2020-03-01 DIAGNOSIS — E7800 Pure hypercholesterolemia, unspecified: Secondary | ICD-10-CM | POA: Diagnosis not present

## 2020-03-01 DIAGNOSIS — E876 Hypokalemia: Secondary | ICD-10-CM | POA: Diagnosis not present

## 2020-03-01 DIAGNOSIS — M797 Fibromyalgia: Secondary | ICD-10-CM | POA: Diagnosis not present

## 2020-03-02 ENCOUNTER — Telehealth: Payer: Self-pay

## 2020-03-02 NOTE — Telephone Encounter (Signed)
Ebony Scott was recently hospitalized, had medication reaction to Lasix. Having a lot of incontinence, asked was it any other medication that could be taken. Will set patient up with hospital follow up virtual visit. appt scheduled for 03/03/20 @ 10:15

## 2020-03-03 ENCOUNTER — Ambulatory Visit: Payer: Medicare PPO | Admitting: Family Medicine

## 2020-03-03 ENCOUNTER — Other Ambulatory Visit: Payer: Self-pay

## 2020-03-03 ENCOUNTER — Encounter: Payer: Self-pay | Admitting: Family Medicine

## 2020-03-03 ENCOUNTER — Telehealth (INDEPENDENT_AMBULATORY_CARE_PROVIDER_SITE_OTHER): Payer: Medicare PPO | Admitting: Family Medicine

## 2020-03-03 ENCOUNTER — Ambulatory Visit
Admission: RE | Admit: 2020-03-03 | Discharge: 2020-03-03 | Disposition: A | Discharge status: Home / Self Care | Payer: Medicare PPO | Source: Ambulatory Visit | Attending: Family Medicine | Admitting: Family Medicine

## 2020-03-03 VITALS — HR 86 | Resp 20

## 2020-03-03 DIAGNOSIS — M797 Fibromyalgia: Secondary | ICD-10-CM | POA: Diagnosis not present

## 2020-03-03 DIAGNOSIS — N1832 Chronic kidney disease, stage 3b: Secondary | ICD-10-CM | POA: Diagnosis not present

## 2020-03-03 DIAGNOSIS — Z96611 Presence of right artificial shoulder joint: Secondary | ICD-10-CM | POA: Diagnosis not present

## 2020-03-03 DIAGNOSIS — J90 Pleural effusion, not elsewhere classified: Secondary | ICD-10-CM | POA: Diagnosis not present

## 2020-03-03 DIAGNOSIS — R059 Cough, unspecified: Secondary | ICD-10-CM

## 2020-03-03 DIAGNOSIS — R4182 Altered mental status, unspecified: Secondary | ICD-10-CM

## 2020-03-03 DIAGNOSIS — E0500 Thyrotoxicosis with diffuse goiter without thyrotoxic crisis or storm: Secondary | ICD-10-CM | POA: Diagnosis not present

## 2020-03-03 DIAGNOSIS — E7800 Pure hypercholesterolemia, unspecified: Secondary | ICD-10-CM | POA: Diagnosis not present

## 2020-03-03 DIAGNOSIS — E876 Hypokalemia: Secondary | ICD-10-CM | POA: Diagnosis not present

## 2020-03-03 DIAGNOSIS — I739 Peripheral vascular disease, unspecified: Secondary | ICD-10-CM | POA: Diagnosis not present

## 2020-03-03 DIAGNOSIS — M0600 Rheumatoid arthritis without rheumatoid factor, unspecified site: Secondary | ICD-10-CM | POA: Diagnosis not present

## 2020-03-03 DIAGNOSIS — J9811 Atelectasis: Secondary | ICD-10-CM | POA: Diagnosis not present

## 2020-03-03 DIAGNOSIS — Z471 Aftercare following joint replacement surgery: Secondary | ICD-10-CM | POA: Diagnosis not present

## 2020-03-03 NOTE — Telephone Encounter (Signed)
Will discuss at ov today

## 2020-03-03 NOTE — Progress Notes (Signed)
Subjective:    Patient ID: Ebony Scott, female    DOB: 07-13-1950, 70 y.o.   MRN: [id number]  HPI Patient is being seen today as a hospital discharge follow-up.  Patient was originally admitted to the hospital December 28 through the December 31.  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  Unfortunately, on January 4, she was admitted to the hospital with altered mental status.  Her daughter found her lying on the couch unresponsive.  Initially a CT scan of the head was obtained which was negative.  Urinalysis was negative.  Chest x-ray was negative.  Urine drug screen was negative.  She was admitted with metabolic encephalopathy thought to be due to either tramadol or baclofen.  She also had lactic acidosis thought to be due to dehydration which was treated with IV fluids.  Today's phone call is in follow-up from her recent discharge from the hospital to check on her and see how she is doing.  Originally visit started as a telephone call.  Phone call began at 930.  Phone call concluded at 945 however we decided to convert the visit into a face-to-face visit.  Patient states that she is urinating every 30 minutes.  She has been taking Lasix 40 mg in the morning and 20 mg in the evening.  She has been on the that due to diastolic heart failure and leg swelling.  However in the hospital, she was given copious amounts of IV fluid.  Her creatinine baseline is typically around 1.7.  On discharge from the hospital it was 1.1.  Therefore I suspect that she may have been vigorously rehydrated perhaps excessively.  She also reports chest congestion and coughing.  This makes me concerned about possible pulmonary edema.  The patient is asking can she stop the Lasix.  While I am fine stopping the Lasix due to the urinary incontinence I am concerned that if she does have pulmonary edema this would only exacerbate the situation.  Therefore I would like to see  her in person so that I can listen to her lungs.  Also recommended beginning a chest x-ray because the last x-ray she had on January 8 did show layering pleural effusions which would suggest fluid overload.  Therefore my tentative impression is that she should probably stay on at least 40 mg a day of the Lasix to help address pulmonary edema.  Again I would want to see the patient face-to-face before making this decision.  She also wants a referral for home health nursing.  She needs help at home after her surgery.  I explained to the patient that this too would need a face-to-face visit and therefore the patient agrees to come to my office so that I can lay eyes on her to get this qualified. Past Medical History:  Diagnosis Date  . Acute deep vein thrombosis (DVT) of right lower extremity ([place]) 12/13/2017  . Allergic rhinitis   . Allergy    SEASONAL  . Anemia   . Anxiety    pt denies  . Arthritis    Phreesia 10/21/2019  . Asthma   . Asthma    Phreesia 10/21/2019  . Breast cancer ([place]) 1998   in remission  . Cataract    REMOVED  . Clostridium difficile colitis 01/14/2018  . Clostridium difficile diarrhea 01/14/2018  . Complication of anesthesia    "had hard time waking up from it several times" (02/20/2012)  . Depression    "  some; don't take anything for it" (02/20/2012)  . Diverticulosis   . DVT (deep venous thrombosis) ([place])   . Exertional dyspnea   . Fibromyalgia 11/2011  . GERD (gastroesophageal reflux disease)   . Graves disease   . Headache(784.0)    "related to allergies; more at different times during the year" (02/20/2012)  . Hemorrhoids   . Hiatal hernia    back and neck  . Hx of adenomatous colonic polyps 04/12/2016  . Hypercholesteremia    good cholesterol is high  . Hypothyroidism   . IBS (irritable bowel syndrome)   . Moderate persistent asthma    -FeV1 72% 2011, -IgE 102 2011, CT sinus Neg 2011  . Osteoporosis    on reclast yearly  . Peripheral vascular disease  ([place]) 2019   DVTs  . Pneumonia 04/2011; ~ 11/2011   "double; single" (02/20/2012)  . Seronegative rheumatoid arthritis ([place])    Dr. Lahoma Rocker  . SIRS (systemic inflammatory response syndrome) ([place]) 02/10/2018  . Tracheobronchomalacia    Past Surgical History:  Procedure Laterality Date  . ABDOMINAL HYSTERECTOMY N/A    Phreesia 10/21/2019  . ANTERIOR AND POSTERIOR REPAIR  1990's  . APPENDECTOMY    . BREAST LUMPECTOMY  1998   left  . BREAST SURGERY N/A    Phreesia 10/21/2019  . CARPOMETACARPEL ([place]) FUSION OF THUMB WITH AUTOGRAFT FROM RADIUS  ~ 2009   "both thumbs" (02/20/2012)  . CATARACT EXTRACTION W/ INTRAOCULAR LENS  IMPLANT, BILATERAL  2012  . CERVICAL DISCECTOMY  10/2001   C5-C6  . CERVICAL FUSION  2003   C3-C4  . CHOLECYSTECTOMY    . COLONOSCOPY    . DEBRIDEMENT TENNIS ELBOW  ?1970's   right  . ESOPHAGOGASTRODUODENOSCOPY    . EYE SURGERY N/A    Phreesia 10/21/2019  . HYSTERECTOMY    . KNEE ARTHROPLASTY  ?1990's   "?right; w/cartilage repair" (02/20/2012)  . NASAL SEPTUM SURGERY  1980's  . POSTERIOR CERVICAL FUSION/FORAMINOTOMY  2004   "failed initial fusion; rewired  anterior neck" (02/20/2012)  . REVERSE SHOULDER ARTHROPLASTY Right 02/16/2020   Procedure: REVERSE SHOULDER ARTHROPLASTY;  Surgeon: Marchia Bond, MD;  Location: [facility];  Service: Orthopedics;  Laterality: Right;  . SPINE SURGERY N/A    Phreesia 10/21/2019  . TONSILLECTOMY  ~ 1953  . VESICOVAGINAL FISTULA CLOSURE W/ TAH  1988  . VIDEO BRONCHOSCOPY Bilateral 08/23/2016   Procedure: VIDEO BRONCHOSCOPY WITH FLUORO;  Surgeon: Javier Glazier, MD;  Location: [facility];  Service: Cardiopulmonary;  Laterality: Bilateral;   Current Outpatient Medications on File Prior to Visit  Medication Sig Dispense Refill  . acetaminophen (TYLENOL) 650 MG CR tablet Take 1,300 mg by mouth every 8 (eight) hours as needed for pain.    Marland Kitchen albuterol (PROAIR HFA) 108 (90 Base) MCG/ACT inhaler Inhale 2 puffs into the lungs  every 4 (four) hours as needed for wheezing or shortness of breath. 18 g 5  . albuterol (PROVENTIL) (2.5 MG/3ML) 0.083% nebulizer solution Take 3 mLs (2.5 mg total) by nebulization in the morning and at bedtime. Along with flutter valve 180 mL 5  . allopurinol (ZYLOPRIM) 100 MG tablet Take 100 mg by mouth daily.    . Alpha-D-Galactosidase (BEANO PO) Take 2-3 capsules by mouth 3 (three) times daily with meals.    . Alpha-Lipoic Acid 600 MG CAPS Take 600 mg by mouth daily.     Marland Kitchen ammonium lactate (AMLACTIN) 12 % cream Apply topically as needed for dry skin. (Patient taking differently: Apply  1 g topically at bedtime.) 385 g 2  . atorvastatin (LIPITOR) 10 MG tablet TAKE 1 TABLET BY MOUTH DAILY (Patient taking differently: Take 10 mg by mouth daily.) 90 tablet 3  . azelastine (ASTELIN) 0.1 % nasal spray USE 2 SPRAYS IN EACH NOSTRIL DAILY AS DIRECTED (Patient taking differently: Place 2 sprays into both nostrils daily as needed for allergies.) 30 mL 5  . CALCIUM-MAGNESIUM PO Take 1 tablet by mouth 2 (two) times daily.     . cephALEXin (KEFLEX) 500 MG capsule Take 500 mg by mouth 3 (three) times daily. 10 DS    . cetirizine (ZYRTEC) 10 MG tablet Take 10 mg by mouth daily.    . cholecalciferol (VITAMIN D) 25 MCG (1000 UT) tablet Take 1,000 Units by mouth daily.     . cyproheptadine (PERIACTIN) 4 MG tablet TAKE 2 TABLETS BY MOUTH EVERY EVENING (Patient taking differently: Take 8 mg by mouth every evening.) 180 tablet 2  . denosumab (PROLIA) 60 MG/ML SOLN injection Inject 60 mg into the skin every 6 (six) months. Administer in upper arm, thigh, or abdomen    . DEXILANT 60 MG capsule TAKE 1 CAPSULE BY MOUTH EACH MORNING (Patient taking differently: Take 60 mg by mouth daily.) 90 capsule 3  . diclofenac Sodium (VOLTAREN) 1 % GEL APPLY 2 GRAMS TOPICALLY TWICE DAILY (Patient taking differently: Apply 1 application topically 4 (four) times daily as needed (joint pain).) 350 g 3  . DULERA 200-5 MCG/ACT AERO INHALE 2  PUFFS BY MOUTH TWICE DAILY (Patient taking differently: Inhale 2 puffs into the lungs 2 (two) times daily.) 39 g 1  . EPINEPHrine (EPIPEN 2-PAK) 0.3 mg/0.3 mL IJ SOAJ injection Inject 0.3 mLs (0.3 mg total) into the muscle Once PRN. (Patient taking differently: Inject 0.3 mg into the muscle as needed for anaphylaxis.) 0.3 mL 2  . famotidine (PEPCID) 20 MG tablet TAKE 1 TABLET BY MOUTH TWICE A DAY (Patient taking differently: Take 20 mg by mouth 2 (two) times daily.) 60 tablet 5  . fluticasone (FLONASE) 50 MCG/ACT nasal spray Place 2 sprays into both nostrils daily. 16 g 2  . folic acid (FOLVITE) 1 MG tablet Take 1 mg by mouth daily.    . furosemide (LASIX) 40 MG tablet TAKE 1 TABLET BY MOUTH DAILY AS NEEDED (Patient taking differently: Take 20-40 mg by mouth See admin instructions. Take 40 mg in the morning and 20 mg in the evening) 30 tablet 3  . gabapentin (NEURONTIN) 600 MG tablet TAKE 1 TABLET BY MOUTH DAILY (Patient taking differently: Take 600 mg by mouth daily.) 90 tablet 3  . guaiFENesin (MUCINEX) 600 MG 12 hr tablet Take 600 mg by mouth 2 (two) times daily.    . hydrocortisone cream 0.5 % APPLY TO THE AFFECTED AREA(S) AS DIRECTED 180 g 2  . Lactase 9000 units TABS Take 18,000-27,000 Units by mouth 4 (four) times daily as needed (dairy consumption).    Marland Kitchen leflunomide (ARAVA) 20 MG tablet Take 20 mg by mouth daily.    Marland Kitchen lidocaine (LIDODERM) 5 % Place 1 patch onto the skin daily as needed (pain). Remove & Discard patch within 12 hours or as directed by MD    . mirtazapine (REMERON SOL-TAB) 30 MG disintegrating tablet DISSOLVE 1 TABLET BY MOUTH EVERY NIGHT AT BEDTIME (Patient taking differently: Take 30 mg by mouth at bedtime.) 90 tablet 1  . MITIGARE 0.6 MG CAPS Take 0.6 mg by mouth daily as needed (inflammation & pain).    Marland Kitchen  montelukast (SINGULAIR) 10 MG tablet TAKE 1 TABLET BY MOUTH DAILY (Patient taking differently: Take 10 mg by mouth every evening.) 30 tablet 5  . mupirocin ointment  (BACTROBAN) 2 % Apply 1 application topically 2 (two) times daily. (Patient taking differently: Apply 1 application topically at bedtime.) 30 g 2  . Nystatin (GERHARDT'S BUTT CREAM) CREA Apply 1 application topically daily as needed (hemorrhoids). NYSTATIN 100,000 UNIT/GM CR 100000 CRM- 60 g, ZINC OXIDE 20 % OINT 20 OINT- 60 g, HYDROCORTISONE 1 % CREAM- 60 g 1 each 11  . nystatin (MYCOSTATIN) 100000 UNIT/ML suspension SWISH AND SPIT 5 MLS TWICE DAILY (Patient taking differently: Use as directed 5 mLs in the mouth or throat at bedtime.) 473 mL 3  . nystatin cream (MYCOSTATIN) Apply 1 application topically 2 (two) times daily.    Marland Kitchen nystatin-triamcinolone ointment (MYCOLOG) Apply topically 2 (two) times daily. (Patient taking differently: Apply 1 application topically 2 (two) times daily as needed (yeast infections).) 90 g 1  . ondansetron (ZOFRAN) 4 MG tablet Take 1 tablet (4 mg total) by mouth every 8 (eight) hours as needed for nausea or vomiting. 10 tablet 0  . predniSONE (DELTASONE) 5 MG tablet Take 15 mg by mouth daily with breakfast.    . Prenatal Vit-Fe Fumarate-FA (PRENATAL MULTIVITAMIN) TABS tablet Take 1 tablet by mouth daily.    . Probiotic Product (ALIGN PO) Take 1 capsule by mouth daily.    Marland Kitchen Propylene Glycol (SYSTANE COMPLETE) 0.6 % SOLN Place 1 drop into both eyes 2 (two) times daily.    Marland Kitchen Respiratory Therapy Supplies (FLUTTER) DEVI Use as directed 1 each 0  . Salicylic Acid-Urea (KERASAL EX) Apply 1 application topically at bedtime.    . sodium chloride HYPERTONIC 3 % nebulizer solution Take by nebulization 2 (two) times daily. (Patient taking differently: Take 4 mLs by nebulization 2 (two) times daily.) 750 mL 12  . Spacer/Aero-Holding Chambers (AEROCHAMBER PLUS) inhaler Use as instructed 1 each 0  . Tiotropium Bromide Monohydrate (SPIRIVA RESPIMAT) 2.5 MCG/ACT AERS Inhale 1 puff into the lungs daily. 4 g 11  . traMADol (ULTRAM) 50 MG tablet Take 1 tablet (50 mg total) by mouth every 6  (six) hours as needed. (Patient taking differently: Take 50 mg by mouth every 6 (six) hours as needed for moderate pain.) 30 tablet 0  . XARELTO 20 MG TABS tablet TAKE 1 TABLET BY MOUTH DAILY WITH SUPPER (Patient taking differently: Take 20 mg by mouth every evening.) 90 tablet 3   Current Facility-Administered Medications on File Prior to Visit  Medication Dose Route Frequency Provider Last Rate Last Admin  . denosumab (PROLIA) injection 60 mg  60 mg Subcutaneous Q6 months [name], MD   60 mg at 09/23/19 1108   Allergies  Allergen Reactions  . Dust Mite Extract Shortness Of Breath and Other (See Comments)    "sneezing" (02/20/2012)  . Molds & Smuts Shortness Of Breath  . Morphine And Related Hives and Itching  . Other Shortness Of Breath and Other (See Comments)    Grass and weeds "sneezing; filled sinuses" (02/20/2012)  . Penicillins Rash and Other (See Comments)    "welts" (02/20/2012) Has patient had a PCN reaction causing immediate rash, facial/tongue/throat swelling, SOB or lightheadedness with hypotension: Unknown Has patient had a PCN reaction causing severe rash involving mucus membranes or skin necrosis: No Has patient had a PCN reaction that required hospitalization: No Has patient had a PCN reaction occurring within the last 10 years: No If  all of the above answers are "NO", then may proceed with Cephalosporin use. Tolerated Ancef 02/16/20.   Marland Kitchen Reclast [Zoledronic Acid] Other (See Comments)    Fever, Put in hospital, dr said it was a reaction from a reaction   . Rofecoxib Swelling    Vioxx REACTION: feet swelling  . Shellfish Allergy Anaphylaxis, Shortness Of Breath, Itching and Rash  . Tetracycline Hcl Nausea And Vomiting  . Xolair [Omalizumab] Other (See Comments)    Caused Blood clot  . Dilaudid [Hydromorphone Hcl] Itching  . Hydrocodone-Acetaminophen Nausea And Vomiting  . Levofloxacin Other (See Comments)    GI upset  . Oxycodone Hcl Nausea And Vomiting  .  Paroxetine Nausea And Vomiting    Paxil   . Baclofen Other (See Comments)    Altered mental status requiring 3 day hospital stay   . Celecoxib Swelling    Feet swelling  . Diltiazem Swelling  . Lactose Intolerance (Gi) Other (See Comments)    Bloating and gas  . Tree Extract Other (See Comments)    "tested and told I was allergic to it; never experienced a reaction to it" (02/20/2012)   Social History   Socioeconomic History  . Marital status: Divorced    Spouse name: Not on file  . Number of children: 2  . Years of education: college  . Highest education level: Not on file  Occupational History  . Occupation: Disabled    Comment: Retired Engineer, production: RETIRED  Tobacco Use  . Smoking status: Passive Smoke Exposure - Never Smoker  . Smokeless tobacco: Never Used  . Tobacco comment: Parents  Vaping Use  . Vaping Use: Never used  Substance and Sexual Activity  . Alcohol use: Not Currently    Alcohol/week: 0.0 standard drinks  . Drug use: No  . Sexual activity: Never  Other Topics Concern  . Not on file  Social History Narrative   Patient lives at home alone. Patient  divorced.    Patient has her BS degree.   Right handed.   Caffeine- sometimes coffee.      [facility]:   Born in [place], [place]. She worked as a Copywriter, advertising. She has no pets currently. She does have indoor plants. Previously had mold in her home that was remediated. Carpet was removed.          Social Determinants of Health   Financial Resource Strain: Not on file  Food Insecurity: Not on file  Transportation Needs: Not on file  Physical Activity: Not on file  Stress: Not on file  Social Connections: Not on file  Intimate Partner Violence: Not on file      Review of Systems  All other systems reviewed and are negative.      Objective:   Physical Exam Constitutional:      General: She is not in acute distress.    Appearance: Normal appearance. She  is not ill-appearing or toxic-appearing.  Cardiovascular:     Rate and Rhythm: Normal rate and regular rhythm.     Heart sounds: Normal heart sounds.  Pulmonary:     Effort: Pulmonary effort is normal. No respiratory distress.     Breath sounds: No stridor. Rales present. No wheezing or rhonchi.  Musculoskeletal:        General: Normal range of motion.     Right lower leg: Edema present.     Left lower leg: Edema present.  Neurological:     Mental Status:  She is alert.           Assessment & Plan:  Cough - Plan: DG Chest 2 View  Stage 3b chronic kidney disease (HCC)  Altered mental status, unspecified altered mental status type  S/P reverse total shoulder arthroplasty, right  Based on the chest x-ray from January 8, the fall in her creatinine from 1.7-1.1, and her new cough and chest congestion and urinary incontinence, I suspect that the patient is fluid overloaded.  Therefore, while stopping the Lasix would likely help the urinary incontinence it may exacerbate pulmonary edema.  Therefore I want her to go get a chest x-ray immediately in order to prevent her bouncing back to the hospital with shortness of breath.  I also want her to swing by my office so that I can measure her pulse oximetry and perform a pulmonary exam.  Based on this we will make decisions about whether to stop or continue the Lasix.  I will consult home health nursing as she would likely qualify for physical therapy and perhaps some in-home care assistance due to her recent surgery  When the patient got to my office for examination, she has +2 pitting edema in both legs distal to the knee.  She has left basilar rails posteriorly and rhonchorous breath sounds throughout.  She appears fluid overloaded to me.  Therefore we will continue to do Lasix 40 mg a day.  Await the results of her chest x-ray to exclude an underlying pneumonia and then reassess next week.  However I believe that she is most likely fluid  overloaded due to vigorous rehydration in the hospital

## 2020-03-04 DIAGNOSIS — E0500 Thyrotoxicosis with diffuse goiter without thyrotoxic crisis or storm: Secondary | ICD-10-CM | POA: Diagnosis not present

## 2020-03-04 DIAGNOSIS — Z96611 Presence of right artificial shoulder joint: Secondary | ICD-10-CM | POA: Diagnosis not present

## 2020-03-04 DIAGNOSIS — M19011 Primary osteoarthritis, right shoulder: Secondary | ICD-10-CM | POA: Diagnosis not present

## 2020-03-04 DIAGNOSIS — J9811 Atelectasis: Secondary | ICD-10-CM | POA: Diagnosis not present

## 2020-03-04 DIAGNOSIS — E876 Hypokalemia: Secondary | ICD-10-CM | POA: Diagnosis not present

## 2020-03-04 DIAGNOSIS — M0600 Rheumatoid arthritis without rheumatoid factor, unspecified site: Secondary | ICD-10-CM | POA: Diagnosis not present

## 2020-03-04 DIAGNOSIS — E7800 Pure hypercholesterolemia, unspecified: Secondary | ICD-10-CM | POA: Diagnosis not present

## 2020-03-04 DIAGNOSIS — M797 Fibromyalgia: Secondary | ICD-10-CM | POA: Diagnosis not present

## 2020-03-04 DIAGNOSIS — I739 Peripheral vascular disease, unspecified: Secondary | ICD-10-CM | POA: Diagnosis not present

## 2020-03-04 DIAGNOSIS — Z471 Aftercare following joint replacement surgery: Secondary | ICD-10-CM | POA: Diagnosis not present

## 2020-03-09 DIAGNOSIS — M19011 Primary osteoarthritis, right shoulder: Secondary | ICD-10-CM | POA: Diagnosis not present

## 2020-03-10 DIAGNOSIS — E7800 Pure hypercholesterolemia, unspecified: Secondary | ICD-10-CM | POA: Diagnosis not present

## 2020-03-10 DIAGNOSIS — Z96611 Presence of right artificial shoulder joint: Secondary | ICD-10-CM | POA: Diagnosis not present

## 2020-03-10 DIAGNOSIS — E876 Hypokalemia: Secondary | ICD-10-CM | POA: Diagnosis not present

## 2020-03-10 DIAGNOSIS — M797 Fibromyalgia: Secondary | ICD-10-CM | POA: Diagnosis not present

## 2020-03-10 DIAGNOSIS — J9811 Atelectasis: Secondary | ICD-10-CM | POA: Diagnosis not present

## 2020-03-10 DIAGNOSIS — E0500 Thyrotoxicosis with diffuse goiter without thyrotoxic crisis or storm: Secondary | ICD-10-CM | POA: Diagnosis not present

## 2020-03-10 DIAGNOSIS — M0600 Rheumatoid arthritis without rheumatoid factor, unspecified site: Secondary | ICD-10-CM | POA: Diagnosis not present

## 2020-03-10 DIAGNOSIS — I739 Peripheral vascular disease, unspecified: Secondary | ICD-10-CM | POA: Diagnosis not present

## 2020-03-10 DIAGNOSIS — Z471 Aftercare following joint replacement surgery: Secondary | ICD-10-CM | POA: Diagnosis not present

## 2020-03-11 DIAGNOSIS — E7800 Pure hypercholesterolemia, unspecified: Secondary | ICD-10-CM | POA: Diagnosis not present

## 2020-03-11 DIAGNOSIS — E0500 Thyrotoxicosis with diffuse goiter without thyrotoxic crisis or storm: Secondary | ICD-10-CM | POA: Diagnosis not present

## 2020-03-11 DIAGNOSIS — E876 Hypokalemia: Secondary | ICD-10-CM | POA: Diagnosis not present

## 2020-03-11 DIAGNOSIS — J9811 Atelectasis: Secondary | ICD-10-CM | POA: Diagnosis not present

## 2020-03-11 DIAGNOSIS — M0600 Rheumatoid arthritis without rheumatoid factor, unspecified site: Secondary | ICD-10-CM | POA: Diagnosis not present

## 2020-03-11 DIAGNOSIS — I739 Peripheral vascular disease, unspecified: Secondary | ICD-10-CM | POA: Diagnosis not present

## 2020-03-11 DIAGNOSIS — Z96611 Presence of right artificial shoulder joint: Secondary | ICD-10-CM | POA: Diagnosis not present

## 2020-03-11 DIAGNOSIS — M797 Fibromyalgia: Secondary | ICD-10-CM | POA: Diagnosis not present

## 2020-03-11 DIAGNOSIS — Z471 Aftercare following joint replacement surgery: Secondary | ICD-10-CM | POA: Diagnosis not present

## 2020-03-14 DIAGNOSIS — E7800 Pure hypercholesterolemia, unspecified: Secondary | ICD-10-CM | POA: Diagnosis not present

## 2020-03-14 DIAGNOSIS — I739 Peripheral vascular disease, unspecified: Secondary | ICD-10-CM | POA: Diagnosis not present

## 2020-03-14 DIAGNOSIS — E876 Hypokalemia: Secondary | ICD-10-CM | POA: Diagnosis not present

## 2020-03-14 DIAGNOSIS — E0500 Thyrotoxicosis with diffuse goiter without thyrotoxic crisis or storm: Secondary | ICD-10-CM | POA: Diagnosis not present

## 2020-03-14 DIAGNOSIS — Z471 Aftercare following joint replacement surgery: Secondary | ICD-10-CM | POA: Diagnosis not present

## 2020-03-14 DIAGNOSIS — M797 Fibromyalgia: Secondary | ICD-10-CM | POA: Diagnosis not present

## 2020-03-14 DIAGNOSIS — M0600 Rheumatoid arthritis without rheumatoid factor, unspecified site: Secondary | ICD-10-CM | POA: Diagnosis not present

## 2020-03-14 DIAGNOSIS — Z96611 Presence of right artificial shoulder joint: Secondary | ICD-10-CM | POA: Diagnosis not present

## 2020-03-14 DIAGNOSIS — J9811 Atelectasis: Secondary | ICD-10-CM | POA: Diagnosis not present

## 2020-03-15 ENCOUNTER — Other Ambulatory Visit: Payer: Self-pay

## 2020-03-15 ENCOUNTER — Ambulatory Visit: Payer: Medicare PPO | Admitting: Family Medicine

## 2020-03-15 ENCOUNTER — Encounter: Payer: Self-pay | Admitting: Adult Health

## 2020-03-15 ENCOUNTER — Ambulatory Visit (INDEPENDENT_AMBULATORY_CARE_PROVIDER_SITE_OTHER): Payer: Medicare PPO | Admitting: Adult Health

## 2020-03-15 ENCOUNTER — Ambulatory Visit: Payer: Medicare PPO | Admitting: Allergy and Immunology

## 2020-03-15 ENCOUNTER — Inpatient Hospital Stay: Payer: Medicare PPO | Admitting: Family Medicine

## 2020-03-15 DIAGNOSIS — I5032 Chronic diastolic (congestive) heart failure: Secondary | ICD-10-CM

## 2020-03-15 DIAGNOSIS — Z7952 Long term (current) use of systemic steroids: Secondary | ICD-10-CM | POA: Diagnosis not present

## 2020-03-15 DIAGNOSIS — M79643 Pain in unspecified hand: Secondary | ICD-10-CM | POA: Diagnosis not present

## 2020-03-15 DIAGNOSIS — Z79899 Other long term (current) drug therapy: Secondary | ICD-10-CM | POA: Diagnosis not present

## 2020-03-15 DIAGNOSIS — N1831 Chronic kidney disease, stage 3a: Secondary | ICD-10-CM

## 2020-03-15 DIAGNOSIS — J398 Other specified diseases of upper respiratory tract: Secondary | ICD-10-CM | POA: Diagnosis not present

## 2020-03-15 DIAGNOSIS — Z23 Encounter for immunization: Secondary | ICD-10-CM | POA: Diagnosis not present

## 2020-03-15 DIAGNOSIS — J4550 Severe persistent asthma, uncomplicated: Secondary | ICD-10-CM

## 2020-03-15 DIAGNOSIS — M810 Age-related osteoporosis without current pathological fracture: Secondary | ICD-10-CM | POA: Diagnosis not present

## 2020-03-15 DIAGNOSIS — M0609 Rheumatoid arthritis without rheumatoid factor, multiple sites: Secondary | ICD-10-CM | POA: Diagnosis not present

## 2020-03-15 DIAGNOSIS — R748 Abnormal levels of other serum enzymes: Secondary | ICD-10-CM | POA: Diagnosis not present

## 2020-03-15 DIAGNOSIS — M7989 Other specified soft tissue disorders: Secondary | ICD-10-CM | POA: Diagnosis not present

## 2020-03-15 NOTE — Assessment & Plan Note (Signed)
Appears to have a component of some diastolic dysfunction.  Patient has some lower extremity edema small left pleural effusion.  She does have some underlying chronic kidney disease.  She is continue on current diuresis with Lasix we will add in extra Lasix 20 mg daily tomorrow.  Patient is to continue low-sodium diet.  She does not appear to be acutely fluid overloaded.  Patient has a follow-up visit in 3 weeks we will repeat chest x-ray at that time.  Plan  Patient Instructions  Continue Dulera 2 puffs twice daily Continue on Spiriva daily Extra Lasix 20mg  daily tomorrow for 1 dose .  Continue on CPAP at bedtime and as needed Continue oxygen at bedtime with CPAP Continue Albuterol nebs twice daily Continue flutter valve Twice daily   Follow up with Dr. Erin Fulling in 3-  4 weeks as planned with chest xray  and As needed

## 2020-03-15 NOTE — Assessment & Plan Note (Signed)
Currently under control.  Patient is recovering from recent surgery.  Has some mild increased cough but no signs of acute infection.  Hold on antibiotics at this time.  She is on chronic steroids no change in current dosing. Continue on aggressive pulmonary hygiene and mucociliary clearance  Plan  Patient Instructions  Continue Dulera 2 puffs twice daily Continue on Spiriva daily Extra Lasix 20mg  daily tomorrow for 1 dose .  Continue on CPAP at bedtime and as needed Continue oxygen at bedtime with CPAP Continue Albuterol nebs twice daily Continue flutter valve Twice daily   Follow up with Dr. Erin Fulling in 3-  4 weeks as planned with chest xray  and As needed

## 2020-03-15 NOTE — Patient Instructions (Addendum)
Continue Dulera 2 puffs twice daily Continue on Spiriva daily Extra Lasix 20mg  daily tomorrow for 1 dose .  Continue on CPAP at bedtime and as needed Continue oxygen at bedtime with CPAP Continue Albuterol nebs twice daily Continue flutter valve Twice daily   Follow up with Dr. Erin Fulling in 3-  4 weeks as planned with chest xray  and As needed

## 2020-03-15 NOTE — Assessment & Plan Note (Signed)
Tracheobronchomalacia continue on CPAP at bedtime and as needed

## 2020-03-15 NOTE — Assessment & Plan Note (Signed)
Recent labs showed this was stable.  Continue follow-up with primary care and renal
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
## 2020-03-16 ENCOUNTER — Other Ambulatory Visit: Payer: Self-pay | Admitting: Family Medicine

## 2020-03-16 ENCOUNTER — Other Ambulatory Visit: Payer: Self-pay | Admitting: Podiatry

## 2020-03-16 ENCOUNTER — Other Ambulatory Visit: Payer: Self-pay | Admitting: Allergy and Immunology

## 2020-03-16 DIAGNOSIS — E7800 Pure hypercholesterolemia, unspecified: Secondary | ICD-10-CM | POA: Diagnosis not present

## 2020-03-16 DIAGNOSIS — I739 Peripheral vascular disease, unspecified: Secondary | ICD-10-CM | POA: Diagnosis not present

## 2020-03-16 DIAGNOSIS — Z471 Aftercare following joint replacement surgery: Secondary | ICD-10-CM | POA: Diagnosis not present

## 2020-03-16 DIAGNOSIS — J4540 Moderate persistent asthma, uncomplicated: Secondary | ICD-10-CM | POA: Diagnosis not present

## 2020-03-16 DIAGNOSIS — R0602 Shortness of breath: Secondary | ICD-10-CM | POA: Diagnosis not present

## 2020-03-16 DIAGNOSIS — J45909 Unspecified asthma, uncomplicated: Secondary | ICD-10-CM | POA: Diagnosis not present

## 2020-03-16 DIAGNOSIS — J471 Bronchiectasis with (acute) exacerbation: Secondary | ICD-10-CM | POA: Diagnosis not present

## 2020-03-16 DIAGNOSIS — J9811 Atelectasis: Secondary | ICD-10-CM | POA: Diagnosis not present

## 2020-03-16 DIAGNOSIS — G4736 Sleep related hypoventilation in conditions classified elsewhere: Secondary | ICD-10-CM | POA: Diagnosis not present

## 2020-03-16 DIAGNOSIS — E0500 Thyrotoxicosis with diffuse goiter without thyrotoxic crisis or storm: Secondary | ICD-10-CM | POA: Diagnosis not present

## 2020-03-16 DIAGNOSIS — M0600 Rheumatoid arthritis without rheumatoid factor, unspecified site: Secondary | ICD-10-CM | POA: Diagnosis not present

## 2020-03-16 DIAGNOSIS — G4733 Obstructive sleep apnea (adult) (pediatric): Secondary | ICD-10-CM | POA: Diagnosis not present

## 2020-03-16 DIAGNOSIS — G894 Chronic pain syndrome: Secondary | ICD-10-CM | POA: Diagnosis not present

## 2020-03-16 DIAGNOSIS — M797 Fibromyalgia: Secondary | ICD-10-CM | POA: Diagnosis not present

## 2020-03-16 DIAGNOSIS — I5032 Chronic diastolic (congestive) heart failure: Secondary | ICD-10-CM | POA: Diagnosis not present

## 2020-03-16 DIAGNOSIS — E876 Hypokalemia: Secondary | ICD-10-CM | POA: Diagnosis not present

## 2020-03-16 DIAGNOSIS — Z96611 Presence of right artificial shoulder joint: Secondary | ICD-10-CM | POA: Diagnosis not present

## 2020-03-16 NOTE — Telephone Encounter (Signed)
Please advise 

## 2020-03-16 NOTE — Telephone Encounter (Signed)
Please advise to refill 

## 2020-03-18 ENCOUNTER — Encounter: Payer: Self-pay | Admitting: *Deleted

## 2020-03-18 DIAGNOSIS — Z96611 Presence of right artificial shoulder joint: Secondary | ICD-10-CM | POA: Diagnosis not present

## 2020-03-18 DIAGNOSIS — M0600 Rheumatoid arthritis without rheumatoid factor, unspecified site: Secondary | ICD-10-CM | POA: Diagnosis not present

## 2020-03-18 DIAGNOSIS — M797 Fibromyalgia: Secondary | ICD-10-CM | POA: Diagnosis not present

## 2020-03-18 DIAGNOSIS — E876 Hypokalemia: Secondary | ICD-10-CM | POA: Diagnosis not present

## 2020-03-18 DIAGNOSIS — I739 Peripheral vascular disease, unspecified: Secondary | ICD-10-CM | POA: Diagnosis not present

## 2020-03-18 DIAGNOSIS — E0500 Thyrotoxicosis with diffuse goiter without thyrotoxic crisis or storm: Secondary | ICD-10-CM | POA: Diagnosis not present

## 2020-03-18 DIAGNOSIS — Z471 Aftercare following joint replacement surgery: Secondary | ICD-10-CM | POA: Diagnosis not present

## 2020-03-18 DIAGNOSIS — J9811 Atelectasis: Secondary | ICD-10-CM | POA: Diagnosis not present

## 2020-03-18 DIAGNOSIS — E7800 Pure hypercholesterolemia, unspecified: Secondary | ICD-10-CM | POA: Diagnosis not present

## 2020-03-21 ENCOUNTER — Ambulatory Visit
Admission: RE | Admit: 2020-03-21 | Discharge: 2020-03-21 | Disposition: A | Discharge status: Home / Self Care | Payer: Medicare PPO | Source: Ambulatory Visit | Attending: Family Medicine | Admitting: Family Medicine

## 2020-03-21 ENCOUNTER — Other Ambulatory Visit: Payer: Self-pay

## 2020-03-21 ENCOUNTER — Ambulatory Visit (INDEPENDENT_AMBULATORY_CARE_PROVIDER_SITE_OTHER): Payer: Medicare PPO | Admitting: Family Medicine

## 2020-03-21 ENCOUNTER — Other Ambulatory Visit (HOSPITAL_COMMUNITY): Payer: Self-pay | Admitting: Family Medicine

## 2020-03-21 ENCOUNTER — Telehealth: Payer: Self-pay

## 2020-03-21 VITALS — BP 146/90 | HR 102 | Temp 97.5°F | Ht 61.0 in | Wt 132.5 lb

## 2020-03-21 DIAGNOSIS — R059 Cough, unspecified: Secondary | ICD-10-CM | POA: Diagnosis not present

## 2020-03-21 DIAGNOSIS — S46011D Strain of muscle(s) and tendon(s) of the rotator cuff of right shoulder, subsequent encounter: Secondary | ICD-10-CM | POA: Diagnosis not present

## 2020-03-21 DIAGNOSIS — D72829 Elevated white blood cell count, unspecified: Secondary | ICD-10-CM

## 2020-03-21 DIAGNOSIS — R4182 Altered mental status, unspecified: Secondary | ICD-10-CM

## 2020-03-21 DIAGNOSIS — R7989 Other specified abnormal findings of blood chemistry: Secondary | ICD-10-CM

## 2020-03-21 DIAGNOSIS — M19011 Primary osteoarthritis, right shoulder: Secondary | ICD-10-CM | POA: Diagnosis not present

## 2020-03-21 DIAGNOSIS — R7309 Other abnormal glucose: Secondary | ICD-10-CM | POA: Diagnosis not present

## 2020-03-21 MED ORDER — BENZONATATE 200 MG PO CAPS: 200.0000 mg | ORAL_CAPSULE | Freq: Three times a day (TID) | ORAL | 0 refills | Status: DC | PRN

## 2020-03-21 MED ORDER — ONDANSETRON HCL 4 MG PO TABS: 4.0000 mg | ORAL_TABLET | Freq: Three times a day (TID) | ORAL | 0 refills | Status: DC | PRN

## 2020-03-21 MED ORDER — LEVOFLOXACIN 500 MG PO TABS: 500.0000 mg | ORAL_TABLET | Freq: Every day | ORAL | 0 refills | Status: DC

## 2020-03-21 NOTE — Progress Notes (Signed)
Subjective:    Patient ID: Ebony Scott, female    DOB: [birth year], 70 y.o.   MRN: [id number]  HPI  Admit date: 02/23/2020 Discharge date: 02/29/2020  Admitted From: home Disposition:  home  Recommendations for Outpatient Follow-up:  1. Follow up with PCP in 1-2 weeks 2. 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 She had recently undergone right shoulder arthroplasty and had been started on baclofen. Encephalopathy was thought to be secondary to baclofen, improving after baclofen was discontinued.  Discharge Diagnoses:  Principal Problem:   Metabolic encephalopathy Active Problems:   Hyperlipidemia   NECK PAIN, CHRONIC   LPRD (laryngopharyngeal reflux disease)   History of breast cancer   S/P reverse total shoulder arthroplasty, right  Acute metabolic encephalopathy Improved off baclofen  Disposition Declines referral to SNF as recommended by PT Patient can be discharged tomorrow once her daughter returns and can be at home to help her  History of asthma/tracheobronchomalacia CPAP at night   From previous note by Dr. Owens Shark: CKD stage IIIa Will closely monitor BMP. Creatinineimproving, monitor  Rheumatoid arthritison chronic steroid. Continued home medicatoins.  Right shoulder arthroplasty on 02/19/2020. With extensive old bruise on the arm. No tense hematoma or signs of infection. Hemoglobin stable. Seen by orthopedics. On Xarelto, will continue for now We will add a Lidoderm patch, ice compression and arm sling.  History of DVT and PE.On xarelto as outpatient. Likely on continued Xarelto secondary to history of breast cancer with  PE.  Chest x-ray with bibasilar atelectasis.Afebrile, no leukocytosis. No symptoms. No need for antibiotics. Lactate was 1.3. Blood cultures negative.  Hypokalemia.Resolved.Magnesium of 2.0.   Hypernatremia.Resolved.   Elevated AST/ALT. T bilirubin improved, likely medication induced, monitor, if not improving as outpatient recommend repeat viral testing (negative Hep C, Hep B, Hep A in past)   Macrocytic anemia-likely in part from leflunomide. B12 elevated, folate normal.   03/21/20 Patient is here today for hospital follow-up.  Apparently after her discharge from her shoulder surgery, she was taking baclofen 3 times a day on a scheduled basis.  This caused her to be extremely lightheaded and fall and also led to somnolence and lethargy.  Her son found her unresponsive in the floor and took her to the hospital.  She was in the hospital several days and after they held the baclofen and the medicine cleared her system, the patient began to regain her normal level of alertness.  She is here today for follow-up.  MRI of the brain in the hospital revealed mild chronic microvascular changes and cerebral atrophy.  Chest x-ray showed layering effusions suggesting atelectasis.  Lab work showed mild elevations in her liver function test and also a mild elevation in her white blood cell count.  She is chronically on 15 mg of prednisone a day in addition to [place] for rheumatoid arthritis.  She has been discharged home.  She is receiving physical therapy at home.  Over the last few days she has developed a worsening cough.  Today in the office her cough is productive of yellowish-brown bloody sputum.  On pulmonary exam she has pronounced left basilar posterior crackles markedly asymmetric compared to the right side.  I am concerned about pneumonia.  She  also reports worsening shortness of breath Past Medical History:  Diagnosis Date  . Acute deep vein thrombosis (DVT) of right lower  extremity ([place]) 12/13/2017  . Allergic rhinitis   . Allergy    SEASONAL  . Anemia   . Anxiety    pt denies  . Arthritis    Phreesia 10/21/2019  . Asthma   . Asthma    Phreesia 10/21/2019  . Breast cancer ([place]) 1998   in remission  . Cataract    REMOVED  . Clostridium difficile colitis 01/14/2018  . Clostridium difficile diarrhea 01/14/2018  . Complication of anesthesia    "had hard time waking up from it several times" (02/20/2012)  . Depression    "some; don't take anything for it" (02/20/2012)  . Diverticulosis   . DVT (deep venous thrombosis) ([place])   . Exertional dyspnea   . Fibromyalgia 11/2011  . GERD (gastroesophageal reflux disease)   . Graves disease   . Headache(784.0)    "related to allergies; more at different times during the year" (02/20/2012)  . Hemorrhoids   . Hiatal hernia    back and neck  . Hx of adenomatous colonic polyps 04/12/2016  . Hypercholesteremia    good cholesterol is high  . Hypothyroidism   . IBS (irritable bowel syndrome)   . Moderate persistent asthma    -FeV1 72% 2011, -IgE 102 2011, CT sinus Neg 2011  . Osteoporosis    on reclast yearly  . Peripheral vascular disease ([place]) 2019   DVTs  . Pneumonia 04/2011; ~ 11/2011   "double; single" (02/20/2012)  . Seronegative rheumatoid arthritis ([place])    Dr. Lahoma Rocker  . SIRS (systemic inflammatory response syndrome) ([place]) 02/10/2018  . Tracheobronchomalacia    Past Surgical History:  Procedure Laterality Date  . ABDOMINAL HYSTERECTOMY N/A    Phreesia 10/21/2019  . ANTERIOR AND POSTERIOR REPAIR  1990's  . APPENDECTOMY    . BREAST LUMPECTOMY  1998   left  . BREAST SURGERY N/A    Phreesia 10/21/2019  . CARPOMETACARPEL ([place]) FUSION OF THUMB WITH AUTOGRAFT FROM RADIUS  ~ 2009   "both thumbs" (02/20/2012)  . CATARACT EXTRACTION W/ INTRAOCULAR LENS  IMPLANT, BILATERAL  2012  . CERVICAL DISCECTOMY  10/2001   C5-C6  . CERVICAL FUSION  2003   C3-C4  . CHOLECYSTECTOMY    . COLONOSCOPY    .  DEBRIDEMENT TENNIS ELBOW  ?1970's   right  . ESOPHAGOGASTRODUODENOSCOPY    . EYE SURGERY N/A    Phreesia 10/21/2019  . HYSTERECTOMY    . KNEE ARTHROPLASTY  ?1990's   "?right; w/cartilage repair" (02/20/2012)  . NASAL SEPTUM SURGERY  1980's  . POSTERIOR CERVICAL FUSION/FORAMINOTOMY  2004   "failed initial fusion; rewired  anterior neck" (02/20/2012)  . REVERSE SHOULDER ARTHROPLASTY Right 02/16/2020   Procedure: REVERSE SHOULDER ARTHROPLASTY;  Surgeon: Marchia Bond, MD;  Location: [facility];  Service: Orthopedics;  Laterality: Right;  . SPINE SURGERY N/A    Phreesia 10/21/2019  . TONSILLECTOMY  ~ 1953  . VESICOVAGINAL FISTULA CLOSURE W/ TAH  1988  . VIDEO BRONCHOSCOPY Bilateral 08/23/2016   Procedure: VIDEO BRONCHOSCOPY WITH FLUORO;  Surgeon: Javier Glazier, MD;  Location: [facility];  Service: Cardiopulmonary;  Laterality: Bilateral;   Current Outpatient Medications on File Prior to Visit  Medication Sig Dispense Refill  . acetaminophen (TYLENOL) 650 MG CR tablet Take 1,300 mg by mouth every 8 (eight) hours as needed for pain.    Marland Kitchen albuterol (PROAIR HFA) 108 (90  Base) MCG/ACT inhaler Inhale 2 puffs into the lungs every 4 (four) hours as needed for wheezing or shortness of breath. 18 g 5  . albuterol (PROVENTIL) (2.5 MG/3ML) 0.083% nebulizer solution Take 3 mLs (2.5 mg total) by nebulization in the morning and at bedtime. Along with flutter valve 180 mL 5  . allopurinol (ZYLOPRIM) 100 MG tablet Take 100 mg by mouth daily.    . Alpha-D-Galactosidase (BEANO PO) Take 2-3 capsules by mouth 3 (three) times daily with meals.    . Alpha-Lipoic Acid 600 MG CAPS Take 600 mg by mouth daily.     Marland Kitchen ammonium lactate (AMLACTIN) 12 % cream Apply topically as needed for dry skin. (Patient taking differently: Apply 1 g topically at bedtime.) 385 g 2  . atorvastatin (LIPITOR) 10 MG tablet TAKE 1 TABLET BY MOUTH DAILY 90 tablet 3  . azelastine (ASTELIN) 0.1 % nasal spray USE 2 SPRAYS IN EACH NOSTRIL DAILY AS  DIRECTED (Patient taking differently: Place 2 sprays into both nostrils daily as needed for allergies.) 30 mL 5  . CALCIUM-MAGNESIUM PO Take 1 tablet by mouth 2 (two) times daily.     . cephALEXin (KEFLEX) 500 MG capsule Take 500 mg by mouth 3 (three) times daily. 10 DS    . cetirizine (ZYRTEC) 10 MG tablet Take 10 mg by mouth daily.    . cholecalciferol (VITAMIN D) 25 MCG (1000 UT) tablet Take 1,000 Units by mouth daily.     . cyproheptadine (PERIACTIN) 4 MG tablet TAKE 2 TABLETS BY MOUTH EVERY EVENING (Patient taking differently: Take 8 mg by mouth every evening.) 180 tablet 2  . denosumab (PROLIA) 60 MG/ML SOLN injection Inject 60 mg into the skin every 6 (six) months. Administer in upper arm, thigh, or abdomen    . DEXILANT 60 MG capsule TAKE 1 CAPSULE BY MOUTH EACH MORNING (Patient taking differently: Take 60 mg by mouth daily.) 90 capsule 3  . diclofenac Sodium (VOLTAREN) 1 % GEL APPLY 2 GRAMS TOPICALLY TWICE DAILY (Patient taking differently: Apply 1 application topically 4 (four) times daily as needed (joint pain).) 350 g 3  . DULERA 200-5 MCG/ACT AERO INHALE 2 PUFFS BY MOUTH TWICE DAILY (Patient taking differently: Inhale 2 puffs into the lungs 2 (two) times daily.) 39 g 1  . EPINEPHrine (EPIPEN 2-PAK) 0.3 mg/0.3 mL IJ SOAJ injection Inject 0.3 mLs (0.3 mg total) into the muscle Once PRN. (Patient taking differently: Inject 0.3 mg into the muscle as needed for anaphylaxis.) 0.3 mL 2  . famotidine (PEPCID) 20 MG tablet TAKE 1 TABLET BY MOUTH TWICE A DAY (Patient taking differently: Take 20 mg by mouth 2 (two) times daily.) 60 tablet 5  . fluticasone (FLONASE) 50 MCG/ACT nasal spray Place 2 sprays into both nostrils daily. 16 g 2  . folic acid (FOLVITE) 1 MG tablet Take 1 mg by mouth daily.    . furosemide (LASIX) 40 MG tablet TAKE 1 TABLET BY MOUTH DAILY AS NEEDED 30 tablet 3  . furosemide (LASIX) 40 MG tablet TAKE 1 TABLET BY MOUTH TWICE DAILY 60 tablet 1  . gabapentin (NEURONTIN) 600 MG  tablet TAKE 1 TABLET BY MOUTH DAILY (Patient taking differently: Take 600 mg by mouth daily.) 90 tablet 3  . guaiFENesin (MUCINEX) 600 MG 12 hr tablet Take 600 mg by mouth 2 (two) times daily.    . hydrocortisone cream 0.5 % APPLY TO THE AFFECTED AREA(S) AS DIRECTED 180 g 2  . Lactase 9000 units TABS Take 18,000-27,000 Units by  mouth 4 (four) times daily as needed (dairy consumption).    Marland Kitchen leflunomide (ARAVA) 20 MG tablet Take 20 mg by mouth daily.    Marland Kitchen lidocaine (LIDODERM) 5 % Place 1 patch onto the skin daily as needed (pain). Remove & Discard patch within 12 hours or as directed by MD    . mirtazapine (REMERON SOL-TAB) 30 MG disintegrating tablet DISSOLVE 1 TABLET BY MOUTH EVERY NIGHT AT BEDTIME (Patient taking differently: Take 30 mg by mouth at bedtime.) 90 tablet 1  . MITIGARE 0.6 MG CAPS Take 0.6 mg by mouth daily as needed (inflammation & pain).    . montelukast (SINGULAIR) 10 MG tablet TAKE 1 TABLET BY MOUTH DAILY (Patient taking differently: Take 10 mg by mouth every evening.) 30 tablet 5  . mupirocin ointment (BACTROBAN) 2 % Apply 1 application topically at bedtime. 22 g 2  . Nystatin (GERHARDT'S BUTT CREAM) CREA Apply 1 application topically daily as needed (hemorrhoids). NYSTATIN 100,000 UNIT/GM CR 100000 CRM- 60 g, ZINC OXIDE 20 % OINT 20 OINT- 60 g, HYDROCORTISONE 1 % CREAM- 60 g 1 each 11  . nystatin (MYCOSTATIN) 100000 UNIT/ML suspension SWISH AND SPIT 5 MLS TWICE DAILY 473 mL 3  . nystatin cream (MYCOSTATIN) Apply 1 application topically 2 (two) times daily.    Marland Kitchen nystatin-triamcinolone ointment (MYCOLOG) Apply topically 2 (two) times daily. (Patient taking differently: Apply 1 application topically 2 (two) times daily as needed (yeast infections).) 90 g 1  . ondansetron (ZOFRAN) 4 MG tablet Take 1 tablet (4 mg total) by mouth every 8 (eight) hours as needed for nausea or vomiting. 10 tablet 0  . predniSONE (DELTASONE) 5 MG tablet Take 15 mg by mouth daily with breakfast.    .  Prenatal Vit-Fe Fumarate-FA (PRENATAL MULTIVITAMIN) TABS tablet Take 1 tablet by mouth daily.    . Probiotic Product (ALIGN PO) Take 1 capsule by mouth daily.    Marland Kitchen Propylene Glycol (SYSTANE COMPLETE) 0.6 % SOLN Place 1 drop into both eyes 2 (two) times daily.    Marland Kitchen Respiratory Therapy Supplies (FLUTTER) DEVI Use as directed 1 each 0  . Salicylic Acid-Urea (KERASAL EX) Apply 1 application topically at bedtime.    . sodium chloride HYPERTONIC 3 % nebulizer solution Take by nebulization 2 (two) times daily. (Patient taking differently: Take 4 mLs by nebulization 2 (two) times daily.) 750 mL 12  . Spacer/Aero-Holding Chambers (AEROCHAMBER PLUS) inhaler Use as instructed 1 each 0  . Tiotropium Bromide Monohydrate (SPIRIVA RESPIMAT) 2.5 MCG/ACT AERS Inhale 1 puff into the lungs daily. 4 g 11  . traMADol (ULTRAM) 50 MG tablet Take 1 tablet (50 mg total) by mouth every 6 (six) hours as needed. (Patient taking differently: Take 50 mg by mouth every 6 (six) hours as needed for moderate pain.) 30 tablet 0  . XARELTO 20 MG TABS tablet TAKE 1 TABLET BY MOUTH DAILY WITH SUPPER (Patient taking differently: Take 20 mg by mouth every evening.) 90 tablet 3   Current Facility-Administered Medications on File Prior to Visit  Medication Dose Route Frequency Provider Last Rate Last Admin  . denosumab (PROLIA) injection 60 mg  60 mg Subcutaneous Q6 months [name], MD   60 mg at 09/23/19 1108   Allergies  Allergen Reactions  . Dust Mite Extract Shortness Of Breath and Other (See Comments)    "sneezing" (02/20/2012)  . Molds & Smuts Shortness Of Breath  . Morphine And Related Hives and Itching  . Other Shortness Of Breath and Other (See  Comments)    Grass and weeds "sneezing; filled sinuses" (02/20/2012)  . Penicillins Rash and Other (See Comments)    "welts" (02/20/2012) Has patient had a PCN reaction causing immediate rash, facial/tongue/throat swelling, SOB or lightheadedness with hypotension: Unknown Has  patient had a PCN reaction causing severe rash involving mucus membranes or skin necrosis: No Has patient had a PCN reaction that required hospitalization: No Has patient had a PCN reaction occurring within the last 10 years: No If all of the above answers are "NO", then may proceed with Cephalosporin use. Tolerated Ancef 02/16/20.   Marland Kitchen Reclast [Zoledronic Acid] Other (See Comments)    Fever, Put in hospital, dr said it was a reaction from a reaction   . Rofecoxib Swelling    Vioxx REACTION: feet swelling  . Shellfish Allergy Anaphylaxis, Shortness Of Breath, Itching and Rash  . Tetracycline Hcl Nausea And Vomiting  . Xolair [Omalizumab] Other (See Comments)    Caused Blood clot  . Dilaudid [Hydromorphone Hcl] Itching  . Hydrocodone-Acetaminophen Nausea And Vomiting  . Levofloxacin Other (See Comments)    GI upset  . Oxycodone Hcl Nausea And Vomiting  . Paroxetine Nausea And Vomiting    Paxil   . Baclofen Other (See Comments)    Altered mental status requiring 3 day hospital stay   . Celecoxib Swelling    Feet swelling  . Diltiazem Swelling  . Lactose Intolerance (Gi) Other (See Comments)    Bloating and gas  . Tree Extract Other (See Comments)    "tested and told I was allergic to it; never experienced a reaction to it" (02/20/2012)   Social History   Socioeconomic History  . Marital status: Divorced    Spouse name: Not on file  . Number of children: 2  . Years of education: college  . Highest education level: Not on file  Occupational History  . Occupation: Disabled    Comment: Retired Engineer, production: RETIRED  Tobacco Use  . Smoking status: Passive Smoke Exposure - Never Smoker  . Smokeless tobacco: Never Used  . Tobacco comment: Parents  Vaping Use  . Vaping Use: Never used  Substance and Sexual Activity  . Alcohol use: Not Currently    Alcohol/week: 0.0 standard drinks  . Drug use: No  . Sexual activity: Never  Other Topics Concern   . Not on file  Social History Narrative   Patient lives at home alone. Patient  divorced.    Patient has her BS degree.   Right handed.   Caffeine- sometimes coffee.      [facility] [place] [facility]:   Born in [place], [place]. She worked as a Copywriter, advertising. She has no pets currently. She does have indoor plants. Previously had mold in her home that was remediated. Carpet was removed.          Social Determinants of Health   Financial Resource Strain: Not on file  Food Insecurity: Not on file  Transportation Needs: Not on file  Physical Activity: Not on file  Stress: Not on file  Social Connections: Not on file  Intimate Partner Violence: Not on file     Review of Systems  All other systems reviewed and are negative.      Objective:   Physical Exam Vitals reviewed.  Constitutional:      General: She is not in acute distress.    Appearance: Normal appearance. She is well-developed. She is not ill-appearing or toxic-appearing.  HENT:  Right Ear: Tympanic membrane and ear canal normal.     Left Ear: Tympanic membrane and ear canal normal.     Nose: No congestion or rhinorrhea.  Cardiovascular:     Rate and Rhythm: Normal rate and regular rhythm.     Heart sounds: Normal heart sounds.  Pulmonary:     Effort: Pulmonary effort is normal. No respiratory distress.     Breath sounds: No stridor or decreased air movement. Rales present. No wheezing.    Abdominal:     General: Bowel sounds are normal. There is no distension.     Palpations: Abdomen is soft.     Tenderness: There is no abdominal tenderness. There is no guarding.  Musculoskeletal:     Right lower leg: Edema present.     Left lower leg: Edema present.  Skin:    Findings: No erythema.  Neurological:     Mental Status: She is alert and oriented to person, place, and time.     Cranial Nerves: No cranial nerve deficit.     Sensory: No sensory deficit.     Motor: No weakness.     Coordination: Coordination  normal.   Moon facies, buffalo hump       Assessment & Plan:  Altered mental status, unspecified altered mental status type - Plan: CBC with Differential/Platelet, COMPLETE METABOLIC PANEL WITH GFR  Cough - Plan: DG Chest 2 View  Elevated LFTs  Leukocytosis, unspecified type  I am very concerned with the recent hospital admission, the abnormal breath sounds, her elevated white blood cell count, and her cough productive of purulent sputum that she is developing pneumonia.  Given her complicated medical history I feel that we need to cover for Pseudomonas so we will start her on Levaquin 500 mg daily for 7 days and obtain a chest x-ray as soon as possible.  Repeat CBC to monitor her leukocytosis.  Repeat CMP to monitor her liver function test.  She is on allopurinol, Arava, atorvastatin, and Tylenol all of which can irritate the liver.  I suspect that this is due to polypharmacy.  In the past I have held her statin and the elevated liver function test improved.  However given her chronic microvascular changes and cerebral atrophy, I feel it is important that we keep her on a statin to prevent future mini strokes and potentially vascular dementia.  Therefore I will await the results of her liver function test and potentially try to get her to stop allopurinol and/or Tylenol prior to stopping atorvastatin.  Await the results of the chest x-ray

## 2020-03-21 NOTE — Telephone Encounter (Signed)
Mupirocin cream and patches used for pain called [facility] pharmacy.

## 2020-03-22 ENCOUNTER — Other Ambulatory Visit: Payer: Self-pay

## 2020-03-22 ENCOUNTER — Telehealth: Payer: Self-pay | Admitting: Family Medicine

## 2020-03-22 DIAGNOSIS — M0600 Rheumatoid arthritis without rheumatoid factor, unspecified site: Secondary | ICD-10-CM | POA: Diagnosis not present

## 2020-03-22 DIAGNOSIS — M797 Fibromyalgia: Secondary | ICD-10-CM | POA: Diagnosis not present

## 2020-03-22 DIAGNOSIS — E876 Hypokalemia: Secondary | ICD-10-CM | POA: Diagnosis not present

## 2020-03-22 DIAGNOSIS — J3089 Other allergic rhinitis: Secondary | ICD-10-CM

## 2020-03-22 DIAGNOSIS — I739 Peripheral vascular disease, unspecified: Secondary | ICD-10-CM | POA: Diagnosis not present

## 2020-03-22 DIAGNOSIS — J9811 Atelectasis: Secondary | ICD-10-CM | POA: Diagnosis not present

## 2020-03-22 DIAGNOSIS — E0500 Thyrotoxicosis with diffuse goiter without thyrotoxic crisis or storm: Secondary | ICD-10-CM | POA: Diagnosis not present

## 2020-03-22 DIAGNOSIS — E7800 Pure hypercholesterolemia, unspecified: Secondary | ICD-10-CM | POA: Diagnosis not present

## 2020-03-22 DIAGNOSIS — Z96611 Presence of right artificial shoulder joint: Secondary | ICD-10-CM | POA: Diagnosis not present

## 2020-03-22 DIAGNOSIS — Z471 Aftercare following joint replacement surgery: Secondary | ICD-10-CM | POA: Diagnosis not present

## 2020-03-22 MED ORDER — MUPIROCIN CALCIUM 2 % EX CREA
TOPICAL_CREAM | Freq: Two times a day (BID) | CUTANEOUS | Status: DC
Start: 2020-03-22 — End: 2020-03-31

## 2020-03-22 NOTE — Telephone Encounter (Signed)
No Name Patient Pharmacy called to ask that mupirocin ointment (BACTROBAN) 2 % [[id number]]  Be changed to a cream because this would be more affordable. Please call back  Cb#: [phone]

## 2020-03-23 ENCOUNTER — Other Ambulatory Visit: Payer: Self-pay | Admitting: *Deleted

## 2020-03-23 ENCOUNTER — Telehealth: Payer: Self-pay | Admitting: Family Medicine

## 2020-03-23 DIAGNOSIS — D539 Nutritional anemia, unspecified: Secondary | ICD-10-CM

## 2020-03-23 DIAGNOSIS — D631 Anemia in chronic kidney disease: Secondary | ICD-10-CM | POA: Diagnosis not present

## 2020-03-23 DIAGNOSIS — I739 Peripheral vascular disease, unspecified: Secondary | ICD-10-CM | POA: Diagnosis not present

## 2020-03-23 DIAGNOSIS — E0500 Thyrotoxicosis with diffuse goiter without thyrotoxic crisis or storm: Secondary | ICD-10-CM | POA: Diagnosis not present

## 2020-03-23 DIAGNOSIS — N183 Chronic kidney disease, stage 3 unspecified: Secondary | ICD-10-CM | POA: Diagnosis not present

## 2020-03-23 DIAGNOSIS — Z96611 Presence of right artificial shoulder joint: Secondary | ICD-10-CM | POA: Diagnosis not present

## 2020-03-23 DIAGNOSIS — G9341 Metabolic encephalopathy: Secondary | ICD-10-CM | POA: Diagnosis not present

## 2020-03-23 DIAGNOSIS — M0600 Rheumatoid arthritis without rheumatoid factor, unspecified site: Secondary | ICD-10-CM | POA: Diagnosis not present

## 2020-03-23 DIAGNOSIS — Z471 Aftercare following joint replacement surgery: Secondary | ICD-10-CM | POA: Diagnosis not present

## 2020-03-23 DIAGNOSIS — R7989 Other specified abnormal findings of blood chemistry: Secondary | ICD-10-CM

## 2020-03-23 DIAGNOSIS — I5030 Unspecified diastolic (congestive) heart failure: Secondary | ICD-10-CM | POA: Diagnosis not present

## 2020-03-23 MED ORDER — MUPIROCIN CALCIUM 2 % EX CREA: 1.0000 "application " | TOPICAL_CREAM | Freq: Two times a day (BID) | CUTANEOUS | 0 refills | Status: DC

## 2020-03-23 NOTE — Telephone Encounter (Signed)
Prescription sent to pharmacy.

## 2020-03-23 NOTE — Telephone Encounter (Signed)
Call for xray and lab results

## 2020-03-23 NOTE — Telephone Encounter (Signed)
Results of labs given via [place].   Awaiting MD recommendations on CXR.

## 2020-03-24 ENCOUNTER — Other Ambulatory Visit: Payer: Self-pay | Admitting: *Deleted

## 2020-03-24 ENCOUNTER — Telehealth: Payer: Self-pay | Admitting: *Deleted

## 2020-03-24 DIAGNOSIS — Z96611 Presence of right artificial shoulder joint: Secondary | ICD-10-CM | POA: Diagnosis not present

## 2020-03-24 DIAGNOSIS — I5030 Unspecified diastolic (congestive) heart failure: Secondary | ICD-10-CM | POA: Diagnosis not present

## 2020-03-24 DIAGNOSIS — E0500 Thyrotoxicosis with diffuse goiter without thyrotoxic crisis or storm: Secondary | ICD-10-CM | POA: Diagnosis not present

## 2020-03-24 DIAGNOSIS — D631 Anemia in chronic kidney disease: Secondary | ICD-10-CM | POA: Diagnosis not present

## 2020-03-24 DIAGNOSIS — M0600 Rheumatoid arthritis without rheumatoid factor, unspecified site: Secondary | ICD-10-CM | POA: Diagnosis not present

## 2020-03-24 DIAGNOSIS — R911 Solitary pulmonary nodule: Secondary | ICD-10-CM

## 2020-03-24 DIAGNOSIS — Z471 Aftercare following joint replacement surgery: Secondary | ICD-10-CM | POA: Diagnosis not present

## 2020-03-24 DIAGNOSIS — I739 Peripheral vascular disease, unspecified: Secondary | ICD-10-CM | POA: Diagnosis not present

## 2020-03-24 DIAGNOSIS — G9341 Metabolic encephalopathy: Secondary | ICD-10-CM | POA: Diagnosis not present

## 2020-03-24 DIAGNOSIS — N183 Chronic kidney disease, stage 3 unspecified: Secondary | ICD-10-CM | POA: Diagnosis not present

## 2020-03-24 DIAGNOSIS — R059 Cough, unspecified: Secondary | ICD-10-CM

## 2020-03-24 LAB — CBC WITH DIFFERENTIAL/PLATELET
Absolute Monocytes: 745 cells/uL (ref 200–950)
Basophils Absolute: 44 cells/uL (ref 0–200)
Basophils Relative: 0.3 %
Eosinophils Absolute: 0 cells/uL — ABNORMAL LOW (ref 15–500)
Eosinophils Relative: 0 %
HCT: 26.9 % — ABNORMAL LOW (ref 35.0–45.0)
Hemoglobin: 9 g/dL — ABNORMAL LOW (ref 11.7–15.5)
Lymphs Abs: 321 cells/uL — ABNORMAL LOW (ref 850–3900)
MCH: 35.3 pg — ABNORMAL HIGH (ref 27.0–33.0)
MCHC: 33.5 g/dL (ref 32.0–36.0)
MCV: 105.5 fL — ABNORMAL HIGH (ref 80.0–100.0)
MPV: 11.3 fL (ref 7.5–12.5)
Monocytes Relative: 5.1 %
Neutro Abs: 13490 cells/uL — ABNORMAL HIGH (ref 1500–7800)
Neutrophils Relative %: 92.4 %
Platelets: 260 10*3/uL (ref 140–400)
RBC: 2.55 10*6/uL — ABNORMAL LOW (ref 3.80–5.10)
RDW: 15 % (ref 11.0–15.0)
Total Lymphocyte: 2.2 %
WBC: 14.6 10*3/uL — ABNORMAL HIGH (ref 3.8–10.8)

## 2020-03-24 LAB — TEST AUTHORIZATION

## 2020-03-24 LAB — COMPLETE METABOLIC PANEL WITH GFR
AG Ratio: 1.2 (calc) (ref 1.0–2.5)
ALT: 97 U/L — ABNORMAL HIGH (ref 6–29)
AST: 72 U/L — ABNORMAL HIGH (ref 10–35)
Albumin: 3.2 g/dL — ABNORMAL LOW (ref 3.6–5.1)
Alkaline phosphatase (APISO): 111 U/L (ref 37–153)
BUN/Creatinine Ratio: 24 (calc) — ABNORMAL HIGH (ref 6–22)
BUN: 32 mg/dL — ABNORMAL HIGH (ref 7–25)
CO2: 25 mmol/L (ref 20–32)
Calcium: 8.4 mg/dL — ABNORMAL LOW (ref 8.6–10.4)
Chloride: 100 mmol/L (ref 98–110)
Creat: 1.34 mg/dL — ABNORMAL HIGH (ref 0.60–0.93)
GFR, Est African American: 46 mL/min/{1.73_m2} — ABNORMAL LOW (ref >=60)
GFR, Est Non African American: 39 mL/min/{1.73_m2} — ABNORMAL LOW (ref >=60)
Globulin: 2.7 g/dL (calc) (ref 1.9–3.7)
Glucose, Bld: 249 mg/dL — ABNORMAL HIGH (ref 65–99)
Potassium: 4.2 mmol/L (ref 3.5–5.3)
Sodium: 141 mmol/L (ref 135–146)
Total Bilirubin: 0.6 mg/dL (ref 0.2–1.2)
Total Protein: 5.9 g/dL — ABNORMAL LOW (ref 6.1–8.1)

## 2020-03-24 LAB — HEMOGLOBIN A1C W/OUT EAG: Hgb A1c MFr Bld: 6.3 % of total Hgb — ABNORMAL HIGH (ref <5.7)

## 2020-03-24 MED ORDER — GERHARDT'S BUTT CREAM: 1.0000 "application " | TOPICAL_CREAM | Freq: Every day | CUTANEOUS | 11 refills | Status: DC | PRN

## 2020-03-24 MED FILL — GERHARDT'S BUTT CREAM BULK: 30 days supply | Qty: 60 | Fill #0

## 2020-03-24 NOTE — Telephone Encounter (Signed)
Received call from patient.   Reports that daughter Joseph Art has some questions about her stroke.   Call placed to patient daughter Joseph Art per patient request.  [facility].

## 2020-03-25 ENCOUNTER — Ambulatory Visit
Admission: RE | Admit: 2020-03-25 | Discharge: 2020-03-25 | Disposition: A | Discharge status: Home / Self Care | Payer: Medicare PPO | Source: Ambulatory Visit | Attending: Family Medicine | Admitting: Family Medicine

## 2020-03-25 ENCOUNTER — Ambulatory Visit: Payer: Medicare PPO | Admitting: Family Medicine

## 2020-03-25 ENCOUNTER — Telehealth: Payer: Self-pay

## 2020-03-25 ENCOUNTER — Encounter: Payer: Self-pay | Admitting: Family Medicine

## 2020-03-25 ENCOUNTER — Other Ambulatory Visit: Payer: Self-pay

## 2020-03-25 VITALS — BP 140/72 | HR 101 | Temp 97.9°F | Ht 61.0 in | Wt 134.0 lb

## 2020-03-25 DIAGNOSIS — R918 Other nonspecific abnormal finding of lung field: Secondary | ICD-10-CM | POA: Diagnosis not present

## 2020-03-25 DIAGNOSIS — R059 Cough, unspecified: Secondary | ICD-10-CM

## 2020-03-25 DIAGNOSIS — I2510 Atherosclerotic heart disease of native coronary artery without angina pectoris: Secondary | ICD-10-CM | POA: Diagnosis not present

## 2020-03-25 DIAGNOSIS — R911 Solitary pulmonary nodule: Secondary | ICD-10-CM

## 2020-03-25 DIAGNOSIS — J181 Lobar pneumonia, unspecified organism: Secondary | ICD-10-CM | POA: Diagnosis not present

## 2020-03-25 DIAGNOSIS — S2242XA Multiple fractures of ribs, left side, initial encounter for closed fracture: Secondary | ICD-10-CM | POA: Diagnosis not present

## 2020-03-25 DIAGNOSIS — I700 Atherosclerosis of aorta: Secondary | ICD-10-CM | POA: Diagnosis not present

## 2020-03-25 MED ORDER — CEFPODOXIME PROXETIL 200 MG PO TABS: 200.0000 mg | ORAL_TABLET | Freq: Two times a day (BID) | ORAL | 0 refills | Status: DC

## 2020-03-25 MED ORDER — AZITHROMYCIN 250 MG PO TABS: ORAL_TABLET | ORAL | 0 refills | Status: DC

## 2020-03-25 NOTE — Progress Notes (Signed)
Subjective:    Patient ID: Ebony Scott, female    DOB: [birth year], 70 y.o.   MRN: [id number]  HPI  Admit date: 02/23/2020 Discharge date: 02/29/2020  Admitted From: home Disposition:  home  Recommendations for Outpatient Follow-up:  1. Follow up with PCP in 1-2 weeks 2. 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 She had recently undergone right shoulder arthroplasty and had been started on baclofen. Encephalopathy was thought to be secondary to baclofen, improving after baclofen was discontinued.  Discharge Diagnoses:  Principal Problem:   Metabolic encephalopathy Active Problems:   Hyperlipidemia   NECK PAIN, CHRONIC   LPRD (laryngopharyngeal reflux disease)   History of breast cancer   S/P reverse total shoulder arthroplasty, right  Acute metabolic encephalopathy Improved off baclofen  Disposition Declines referral to SNF as recommended by PT Patient can be discharged tomorrow once her daughter returns and can be at home to help her  History of asthma/tracheobronchomalacia CPAP at night   From previous note by Dr. Owens Shark: CKD stage IIIa Will closely monitor BMP. Creatinineimproving, monitor  Rheumatoid arthritison chronic steroid. Continued home medicatoins.  Right shoulder arthroplasty on 02/19/2020. With extensive old bruise on the arm. No tense hematoma or signs of infection. Hemoglobin stable. Seen by orthopedics. On Xarelto, will continue for now We will add a Lidoderm patch, ice compression and arm sling.  History of DVT and PE.On xarelto as outpatient. Likely on continued Xarelto secondary to history of breast cancer with  PE.  Chest x-ray with bibasilar atelectasis.Afebrile, no leukocytosis. No symptoms. No need for antibiotics. Lactate was 1.3. Blood cultures negative.  Hypokalemia.Resolved.Magnesium of 2.0.   Hypernatremia.Resolved.   Elevated AST/ALT. T bilirubin improved, likely medication induced, monitor, if not improving as outpatient recommend repeat viral testing (negative Hep C, Hep B, Hep A in past)   Macrocytic anemia-likely in part from leflunomide. B12 elevated, folate normal.   03/21/20 Patient is here today for hospital follow-up.  Apparently after her discharge from her shoulder surgery, she was taking baclofen 3 times a day on a scheduled basis.  This caused her to be extremely lightheaded and fall and also led to somnolence and lethargy.  Her son found her unresponsive in the floor and took her to the hospital.  She was in the hospital several days and after they held the baclofen and the medicine cleared her system, the patient began to regain her normal level of alertness.  She is here today for follow-up.  MRI of the brain in the hospital revealed mild chronic microvascular changes and cerebral atrophy.  Chest x-ray showed layering effusions suggesting atelectasis.  Lab work showed mild elevations in her liver function test and also a mild elevation in her white blood cell count.  She is chronically on 15 mg of prednisone a day in addition to [place] for rheumatoid arthritis.  She has been discharged home.  She is receiving physical therapy at home.  Over the last few days she has developed a worsening cough.  Today in the office her cough is productive of yellowish-brown bloody sputum.  On pulmonary exam she has pronounced left basilar posterior crackles markedly asymmetric compared to the right side.  I am concerned about pneumonia.  She  also reports worsening shortness of breath.  At that time, my plan was: I am very concerned with the recent hospital admission, the abnormal  breath sounds, her elevated white blood cell count, and her cough productive of purulent sputum that she is developing pneumonia.  Given her complicated medical history I feel that we need to cover for Pseudomonas so we will start her on Levaquin 500 mg daily for 7 days and obtain a chest x-ray as soon as possible.  Repeat CBC to monitor her leukocytosis.  Repeat CMP to monitor her liver function test.  She is on allopurinol, Arava, atorvastatin, and Tylenol all of which can irritate the liver.  I suspect that this is due to polypharmacy.  In the past I have held her statin and the elevated liver function test improved.  However given her chronic microvascular changes and cerebral atrophy, I feel it is important that we keep her on a statin to prevent future mini strokes and potentially vascular dementia.  Therefore I will await the results of her liver function test and potentially try to get her to stop allopurinol and/or Tylenol prior to stopping atorvastatin.  Await the results of the chest x-ray  03/25/20 I obtained a chest x-ray due to the concern for pneumonia at her last visit.  There was no evidence of left lower lobe pneumonia on the chest x-ray however there was a suspicious opacity in the right middle lung prompting [place] to order a CAT scan.  I have copied the findings of the CAT scan below:  IMPRESSION: 1. Multifocal areas of nodularity and or mass with other areas of consolidative change. Findings are suspicious for metastatic disease with infection as a differential consideration and should be excluded given patient history of anti TNF medication and immunosuppression. Pulmonary consultation is suggested. 2. Multifocal areas of sclerosis involving the ribs bilaterally, some areas associated with subacute rib fracture, others without signs of rib fracture. Segmental appearance is suspicious for metastatic disease in this patient with prior lumpectomy. For above findings in both the ribs and  lung parenchyma, PET scan may be helpful. 3. Three-vessel coronary artery disease. 4. Atherosclerosis.  Patient is here today for follow-up.  She continues to have a cough productive of brown sputum.  She continues to have shortness of breath with activity however she states that she feels some better than she did last week.  She does continue to have occasional fevers.  She denies any pleurisy or hemoptysis.  Today she is afebrile and her oxygen is 94% on room air.  However she still has very prominent crackles and rhonchi in the left lower base.  I am unable to appreciate any sounds in the right lung that are atypical.  I reviewed the CAT scan personally with the patient.  There is a large opacity in the left lower lobe with different levels of contrast-enhancement as well as air.  I am concerned about an atypical pneumonia in this area.  The opacities in the right lobe show inflammation and streaking as well.  Therefore the question is whether the patient has multifocal atypical pneumonia, [place], or metastatic cancer. Past Medical History:  Diagnosis Date  . Acute deep vein thrombosis (DVT) of right lower extremity ([place]) 12/13/2017  . Allergic rhinitis   . Allergy    SEASONAL  . Anemia   . Anxiety    pt denies  . Arthritis    Phreesia 10/21/2019  . Asthma   . Asthma    Phreesia 10/21/2019  .  Breast cancer ([place]) 1998   in remission  . Cataract    REMOVED  . Clostridium difficile colitis 01/14/2018  . Clostridium difficile diarrhea 01/14/2018  . Complication of anesthesia    "had hard time waking up from it several times" (02/20/2012)  . Depression    "some; don't take anything for it" (02/20/2012)  . Diverticulosis   . DVT (deep venous thrombosis) ([place])   . Exertional dyspnea   . Fibromyalgia 11/2011  . GERD (gastroesophageal reflux disease)   . Graves disease   . Headache(784.0)    "related to allergies; more at different times during the year" (02/20/2012)  . Hemorrhoids   .  Hiatal hernia    back and neck  . Hx of adenomatous colonic polyps 04/12/2016  . Hypercholesteremia    good cholesterol is high  . Hypothyroidism   . IBS (irritable bowel syndrome)   . Moderate persistent asthma    -FeV1 72% 2011, -IgE 102 2011, CT sinus Neg 2011  . Osteoporosis    on reclast yearly  . Peripheral vascular disease ([place]) 2019   DVTs  . Pneumonia 04/2011; ~ 11/2011   "double; single" (02/20/2012)  . Seronegative rheumatoid arthritis ([place])    Dr. Lahoma Rocker  . SIRS (systemic inflammatory response syndrome) ([place]) 02/10/2018  . Tracheobronchomalacia    Past Surgical History:  Procedure Laterality Date  . ABDOMINAL HYSTERECTOMY N/A    Phreesia 10/21/2019  . ANTERIOR AND POSTERIOR REPAIR  1990's  . APPENDECTOMY    . BREAST LUMPECTOMY  1998   left  . BREAST SURGERY N/A    Phreesia 10/21/2019  . CARPOMETACARPEL ([place]) FUSION OF THUMB WITH AUTOGRAFT FROM RADIUS  ~ 2009   "both thumbs" (02/20/2012)  . CATARACT EXTRACTION W/ INTRAOCULAR LENS  IMPLANT, BILATERAL  2012  . CERVICAL DISCECTOMY  10/2001   C5-C6  . CERVICAL FUSION  2003   C3-C4  . CHOLECYSTECTOMY    . COLONOSCOPY    . DEBRIDEMENT TENNIS ELBOW  ?1970's   right  . ESOPHAGOGASTRODUODENOSCOPY    . EYE SURGERY N/A    Phreesia 10/21/2019  . HYSTERECTOMY    . KNEE ARTHROPLASTY  ?1990's   "?right; w/cartilage repair" (02/20/2012)  . NASAL SEPTUM SURGERY  1980's  . POSTERIOR CERVICAL FUSION/FORAMINOTOMY  2004   "failed initial fusion; rewired  anterior neck" (02/20/2012)  . REVERSE SHOULDER ARTHROPLASTY Right 02/16/2020   Procedure: REVERSE SHOULDER ARTHROPLASTY;  Surgeon: Marchia Bond, MD;  Location: [facility];  Service: Orthopedics;  Laterality: Right;  . SPINE SURGERY N/A    Phreesia 10/21/2019  . TONSILLECTOMY  ~ 1953  . VESICOVAGINAL FISTULA CLOSURE W/ TAH  1988  . VIDEO BRONCHOSCOPY Bilateral 08/23/2016   Procedure: VIDEO BRONCHOSCOPY WITH FLUORO;  Surgeon: Javier Glazier, MD;  Location: [facility];   Service: Cardiopulmonary;  Laterality: Bilateral;   Current Outpatient Medications on File Prior to Visit  Medication Sig Dispense Refill  . acetaminophen (TYLENOL) 650 MG CR tablet Take 1,300 mg by mouth every 8 (eight) hours as needed for pain.    Marland Kitchen albuterol (PROVENTIL) (2.5 MG/3ML) 0.083% nebulizer solution Take 3 mLs (2.5 mg total) by nebulization in the morning and at bedtime. Along with flutter valve 180 mL 5  . allopurinol (ZYLOPRIM) 100 MG tablet Take 100 mg by mouth daily.    . Alpha-D-Galactosidase (BEANO PO) Take 2-3 capsules by mouth 3 (three) times daily with meals.    . Alpha-Lipoic Acid 600 MG CAPS Take 600 mg by mouth daily.     Marland Kitchen  ammonium lactate (AMLACTIN) 12 % cream Apply topically as needed for dry skin. (Patient taking differently: Apply 1 g topically at bedtime.) 385 g 2  . atorvastatin (LIPITOR) 10 MG tablet TAKE 1 TABLET BY MOUTH DAILY 90 tablet 3  . azelastine (ASTELIN) 0.1 % nasal spray USE 2 SPRAYS IN EACH NOSTRIL DAILY AS DIRECTED (Patient taking differently: Place 2 sprays into both nostrils daily as needed for allergies.) 30 mL 5  . CALCIUM-MAGNESIUM PO Take 1 tablet by mouth 2 (two) times daily.     . cephALEXin (KEFLEX) 500 MG capsule Take 500 mg by mouth 3 (three) times daily. 10 DS    . cetirizine (ZYRTEC) 10 MG tablet Take 10 mg by mouth daily.    . cholecalciferol (VITAMIN D) 25 MCG (1000 UT) tablet Take 1,000 Units by mouth daily.     . cyproheptadine (PERIACTIN) 4 MG tablet TAKE 2 TABLETS BY MOUTH EVERY EVENING (Patient taking differently: Take 8 mg by mouth every evening.) 180 tablet 2  . denosumab (PROLIA) 60 MG/ML SOLN injection Inject 60 mg into the skin every 6 (six) months. Administer in upper arm, thigh, or abdomen    . DEXILANT 60 MG capsule TAKE 1 CAPSULE BY MOUTH EACH MORNING (Patient taking differently: Take 60 mg by mouth daily.) 90 capsule 3  . diclofenac Sodium (VOLTAREN) 1 % GEL APPLY 2 GRAMS TOPICALLY TWICE DAILY (Patient taking differently:  Apply 1 application topically 4 (four) times daily as needed (joint pain).) 350 g 3  . DULERA 200-5 MCG/ACT AERO INHALE 2 PUFFS BY MOUTH TWICE DAILY (Patient taking differently: Inhale 2 puffs into the lungs 2 (two) times daily.) 39 g 1  . EPINEPHrine (EPIPEN 2-PAK) 0.3 mg/0.3 mL IJ SOAJ injection Inject 0.3 mLs (0.3 mg total) into the muscle Once PRN. (Patient taking differently: Inject 0.3 mg into the muscle as needed for anaphylaxis.) 0.3 mL 2  . famotidine (PEPCID) 20 MG tablet TAKE 1 TABLET BY MOUTH TWICE A DAY (Patient taking differently: Take 20 mg by mouth 2 (two) times daily.) 60 tablet 5  . fluticasone (FLONASE) 50 MCG/ACT nasal spray Place 2 sprays into both nostrils daily. 16 g 2  . folic acid (FOLVITE) 1 MG tablet Take 1 mg by mouth daily.    . furosemide (LASIX) 40 MG tablet TAKE 1 TABLET BY MOUTH DAILY AS NEEDED 30 tablet 3  . furosemide (LASIX) 40 MG tablet TAKE 1 TABLET BY MOUTH TWICE DAILY 60 tablet 1  . gabapentin (NEURONTIN) 600 MG tablet TAKE 1 TABLET BY MOUTH DAILY (Patient taking differently: Take 600 mg by mouth daily.) 90 tablet 3  . guaiFENesin (MUCINEX) 600 MG 12 hr tablet Take 600 mg by mouth 2 (two) times daily.    . hydrocortisone cream 0.5 % APPLY TO THE AFFECTED AREA(S) AS DIRECTED 180 g 2  . Lactase 9000 units TABS Take 18,000-27,000 Units by mouth 4 (four) times daily as needed (dairy consumption).    Marland Kitchen leflunomide (ARAVA) 20 MG tablet Take 20 mg by mouth daily.    Marland Kitchen levofloxacin (LEVAQUIN) 500 MG tablet Take 1 tablet (500 mg total) by mouth daily. 7 tablet 0  . lidocaine (LIDODERM) 5 % Place 1 patch onto the skin daily as needed (pain). Remove & Discard patch within 12 hours or as directed by MD    . mirtazapine (REMERON SOL-TAB) 30 MG disintegrating tablet DISSOLVE 1 TABLET BY MOUTH EVERY NIGHT AT BEDTIME (Patient taking differently: Take 30 mg by mouth at bedtime.) 90 tablet 1  .  MITIGARE 0.6 MG CAPS Take 0.6 mg by mouth daily as needed (inflammation & pain).     . montelukast (SINGULAIR) 10 MG tablet TAKE 1 TABLET BY MOUTH DAILY (Patient taking differently: Take 10 mg by mouth every evening.) 30 tablet 5  . mupirocin cream (BACTROBAN) 2 % Apply 1 application topically 2 (two) times daily. 15 g 0  . Nystatin (GERHARDT'S BUTT CREAM) CREA Apply 1 application topically daily as needed (hemorrhoids). NYSTATIN 100,000 UNIT/GM CR 100000 CRM- 60 g, ZINC OXIDE 20 % OINT 20 OINT- 60 g, HYDROCORTISONE 1 % CREAM- 60 g 1 each 11  . nystatin (MYCOSTATIN) 100000 UNIT/ML suspension SWISH AND SPIT 5 MLS TWICE DAILY 473 mL 3  . nystatin cream (MYCOSTATIN) Apply 1 application topically 2 (two) times daily.    Marland Kitchen nystatin-triamcinolone ointment (MYCOLOG) Apply topically 2 (two) times daily. (Patient taking differently: Apply 1 application topically 2 (two) times daily as needed (yeast infections).) 90 g 1  . ondansetron (ZOFRAN) 4 MG tablet Take 1 tablet (4 mg total) by mouth every 8 (eight) hours as needed for nausea or vomiting. 10 tablet 0  . ondansetron (ZOFRAN) 4 MG tablet Take 1 tablet (4 mg total) by mouth every 8 (eight) hours as needed for nausea or vomiting. 20 tablet 0  . predniSONE (DELTASONE) 5 MG tablet Take 15 mg by mouth daily with breakfast.    . Prenatal Vit-Fe Fumarate-FA (PRENATAL MULTIVITAMIN) TABS tablet Take 1 tablet by mouth daily.    . Probiotic Product (ALIGN PO) Take 1 capsule by mouth daily.    Marland Kitchen Propylene Glycol (SYSTANE COMPLETE) 0.6 % SOLN Place 1 drop into both eyes 2 (two) times daily.    Marland Kitchen Respiratory Therapy Supplies (FLUTTER) DEVI Use as directed 1 each 0  . Salicylic Acid-Urea (KERASAL EX) Apply 1 application topically at bedtime.    . sodium chloride HYPERTONIC 3 % nebulizer solution Take by nebulization 2 (two) times daily. (Patient taking differently: Take 4 mLs by nebulization 2 (two) times daily.) 750 mL 12  . Spacer/Aero-Holding Chambers (AEROCHAMBER PLUS) inhaler Use as instructed 1 each 0  . Tiotropium Bromide Monohydrate (SPIRIVA  RESPIMAT) 2.5 MCG/ACT AERS Inhale 1 puff into the lungs daily. 4 g 11  . traMADol (ULTRAM) 50 MG tablet Take 1 tablet (50 mg total) by mouth every 6 (six) hours as needed. (Patient taking differently: Take 50 mg by mouth every 6 (six) hours as needed for moderate pain.) 30 tablet 0  . XARELTO 20 MG TABS tablet TAKE 1 TABLET BY MOUTH DAILY WITH SUPPER (Patient taking differently: Take 20 mg by mouth every evening.) 90 tablet 3   Current Facility-Administered Medications on File Prior to Visit  Medication Dose Route Frequency Provider Last Rate Last Admin  . denosumab (PROLIA) injection 60 mg  60 mg Subcutaneous Q6 months [name], MD   60 mg at 09/23/19 1108  . mupirocin cream (BACTROBAN) 2 %   Topical BID [name], MD       Allergies  Allergen Reactions  . Dust Mite Extract Shortness Of Breath and Other (See Comments)    "sneezing" (02/20/2012)  . Molds & Smuts Shortness Of Breath  . Morphine And Related Hives and Itching  . Other Shortness Of Breath and Other (See Comments)    Grass and weeds "sneezing; filled sinuses" (02/20/2012)  . Penicillins Rash and Other (See Comments)    "welts" (02/20/2012) Has patient had a PCN reaction causing immediate rash, facial/tongue/throat swelling, SOB or lightheadedness with  hypotension: Unknown Has patient had a PCN reaction causing severe rash involving mucus membranes or skin necrosis: No Has patient had a PCN reaction that required hospitalization: No Has patient had a PCN reaction occurring within the last 10 years: No If all of the above answers are "NO", then may proceed with Cephalosporin use. Tolerated Ancef 02/16/20.   Marland Kitchen Reclast [Zoledronic Acid] Other (See Comments)    Fever, Put in hospital, dr said it was a reaction from a reaction   . Rofecoxib Swelling    Vioxx REACTION: feet swelling  . Shellfish Allergy Anaphylaxis, Shortness Of Breath, Itching and Rash  . Tetracycline Hcl Nausea And Vomiting  . Xolair [Omalizumab]  Other (See Comments)    Caused Blood clot  . Dilaudid [Hydromorphone Hcl] Itching  . Hydrocodone-Acetaminophen Nausea And Vomiting  . Levofloxacin Other (See Comments)    GI upset  . Oxycodone Hcl Nausea And Vomiting  . Paroxetine Nausea And Vomiting    Paxil   . Baclofen Other (See Comments)    Altered mental status requiring 3 day hospital stay   . Celecoxib Swelling    Feet swelling  . Diltiazem Swelling  . Lactose Intolerance (Gi) Other (See Comments)    Bloating and gas  . Tree Extract Other (See Comments)    "tested and told I was allergic to it; never experienced a reaction to it" (02/20/2012)   Social History   Socioeconomic History  . Marital status: Divorced    Spouse name: Not on file  . Number of children: 2  . Years of education: college  . Highest education level: Not on file  Occupational History  . Occupation: Disabled    Comment: Retired Engineer, production: RETIRED  Tobacco Use  . Smoking status: Passive Smoke Exposure - Never Smoker  . Smokeless tobacco: Never Used  . Tobacco comment: Parents  Vaping Use  . Vaping Use: Never used  Substance and Sexual Activity  . Alcohol use: Not Currently    Alcohol/week: 0.0 standard drinks  . Drug use: No  . Sexual activity: Never  Other Topics Concern  . Not on file  Social History Narrative   Patient lives at home alone. Patient  divorced.    Patient has her BS degree.   Right handed.   Caffeine- sometimes coffee.      [facility]:   Born in [place], [place]. She worked as a Copywriter, advertising. She has no pets currently. She does have indoor plants. Previously had mold in her home that was remediated. Carpet was removed.          Social Determinants of Health   Financial Resource Strain: Not on file  Food Insecurity: Not on file  Transportation Needs: Not on file  Physical Activity: Not on file  Stress: Not on file  Social Connections: Not on file  Intimate Partner Violence:  Not on file     Review of Systems  All other systems reviewed and are negative.      Objective:   Physical Exam Vitals reviewed.  Constitutional:      General: She is not in acute distress.    Appearance: Normal appearance. She is well-developed. She is not ill-appearing or toxic-appearing.  HENT:     Right Ear: Tympanic membrane and ear canal normal.     Left Ear: Tympanic membrane and ear canal normal.     Nose: No congestion or rhinorrhea.  Cardiovascular:     Rate and Rhythm:  Normal rate and regular rhythm.     Heart sounds: Normal heart sounds.  Pulmonary:     Effort: Pulmonary effort is normal. No respiratory distress.     Breath sounds: No stridor or decreased air movement. Rales present. No wheezing.    Abdominal:     General: Bowel sounds are normal. There is no distension.     Palpations: Abdomen is soft.     Tenderness: There is no abdominal tenderness. There is no guarding.  Musculoskeletal:     Right lower leg: Edema present.     Left lower leg: Edema present.  Skin:    Findings: No erythema.  Neurological:     Mental Status: She is alert and oriented to person, place, and time.     Cranial Nerves: No cranial nerve deficit.     Sensory: No sensory deficit.     Motor: No weakness.     Coordination: Coordination normal.   Moon facies, buffalo hump       Assessment & Plan:  Mass of left lung - Plan: Ambulatory referral to Pulmonology  I am going to treat the patient for atypical pneumonia.  Switch the patient to Cefpodoxime 200 mg twice daily for 10 days to cover Streptococcus gram-negative's and anaerobics as well as add a Z-Pak to cover atypical bacteria.  Consult pulmonology as soon as possible.  I believe the patient needs BAL for culture and sensitivity as well as possible tissue biopsy.  I will defer to their expert opinion as to whether the PET scan is necessary or whether we should go straight to biopsy to determine the next course of action.

## 2020-03-28 ENCOUNTER — Telehealth: Payer: Self-pay

## 2020-03-28 ENCOUNTER — Telehealth: Payer: Self-pay | Admitting: Family Medicine

## 2020-03-28 NOTE — Telephone Encounter (Signed)
Yes, I would

## 2020-03-28 NOTE — Telephone Encounter (Signed)
Of note, patient is referring to ABTx, not antibody.

## 2020-03-28 NOTE — Telephone Encounter (Signed)
Pt was able to get the CPAP but wasn't able to get her antibody the pharmacy said the medication will not be ready until Monday just wanted to inform Dr.[name]

## 2020-03-28 NOTE — Telephone Encounter (Signed)
Spoke with pt, she understands she is to continue with the 2nd antibiotic. She will also call [facility] to follow up on the appt needed with their office

## 2020-03-29 ENCOUNTER — Telehealth: Payer: Self-pay | Admitting: Pulmonary Disease

## 2020-03-29 NOTE — Telephone Encounter (Signed)
Called and spoke with patient. Her appt has been changed to 04/01/20 at 945. She is aware to try to get here early since we are working into the schedule. She verbalized understanding.   Nothing further needed at time of call.

## 2020-03-29 NOTE — Telephone Encounter (Signed)
Spoke with patient. She stated that she completed a CT scan last week that was ordered by her PCP Dr. Dennard Schaumann. She stated that the CT is concerning for possible PNA or cancer. Dr. Dennard Schaumann has placed a stat referral for her to come in and be seen for a possible biopsy.   She is a former [facility] patient for bronchiectasis. She has an appt on 04/08/20 with you to establish care. She is requesting a sooner appt. I did check [facility] schedule as well as [facility] schedule to see if I could possibly get her in to be seen sooner. They did not have any openings.   Dr. Erin Fulling, you have a 15 min opening on 04/01/20 at 945am. Would you be willing to see her then? I have explained to her that we do not have any openings and the 18th appt may be the soonest she can get in.

## 2020-03-29 NOTE — Telephone Encounter (Signed)
Yes, I can see her on 04/01/20 at 9:45am.  Thanks, Wille Glaser

## 2020-03-31 ENCOUNTER — Ambulatory Visit: Payer: Medicare PPO | Admitting: Family Medicine

## 2020-03-31 ENCOUNTER — Encounter: Payer: Self-pay | Admitting: Family Medicine

## 2020-03-31 ENCOUNTER — Telehealth: Payer: Self-pay | Admitting: *Deleted

## 2020-03-31 ENCOUNTER — Other Ambulatory Visit: Payer: Self-pay

## 2020-03-31 ENCOUNTER — Telehealth: Payer: Self-pay | Admitting: Family Medicine

## 2020-03-31 VITALS — BP 140/86 | HR 88 | Temp 97.9°F | Resp 18 | Ht 61.0 in | Wt 137.0 lb

## 2020-03-31 DIAGNOSIS — I5030 Unspecified diastolic (congestive) heart failure: Secondary | ICD-10-CM | POA: Diagnosis not present

## 2020-03-31 DIAGNOSIS — Z96611 Presence of right artificial shoulder joint: Secondary | ICD-10-CM | POA: Diagnosis not present

## 2020-03-31 DIAGNOSIS — N1832 Chronic kidney disease, stage 3b: Secondary | ICD-10-CM

## 2020-03-31 DIAGNOSIS — Z471 Aftercare following joint replacement surgery: Secondary | ICD-10-CM | POA: Diagnosis not present

## 2020-03-31 DIAGNOSIS — D631 Anemia in chronic kidney disease: Secondary | ICD-10-CM | POA: Diagnosis not present

## 2020-03-31 DIAGNOSIS — E0500 Thyrotoxicosis with diffuse goiter without thyrotoxic crisis or storm: Secondary | ICD-10-CM | POA: Diagnosis not present

## 2020-03-31 DIAGNOSIS — I739 Peripheral vascular disease, unspecified: Secondary | ICD-10-CM | POA: Diagnosis not present

## 2020-03-31 DIAGNOSIS — M810 Age-related osteoporosis without current pathological fracture: Secondary | ICD-10-CM

## 2020-03-31 DIAGNOSIS — M0600 Rheumatoid arthritis without rheumatoid factor, unspecified site: Secondary | ICD-10-CM | POA: Diagnosis not present

## 2020-03-31 DIAGNOSIS — R7989 Other specified abnormal findings of blood chemistry: Secondary | ICD-10-CM

## 2020-03-31 DIAGNOSIS — R918 Other nonspecific abnormal finding of lung field: Secondary | ICD-10-CM | POA: Diagnosis not present

## 2020-03-31 DIAGNOSIS — G9341 Metabolic encephalopathy: Secondary | ICD-10-CM | POA: Diagnosis not present

## 2020-03-31 DIAGNOSIS — N183 Chronic kidney disease, stage 3 unspecified: Secondary | ICD-10-CM | POA: Diagnosis not present

## 2020-03-31 MED ORDER — HYDROCORTISONE 2.5 % EX CREA: TOPICAL_CREAM | Freq: Two times a day (BID) | CUTANEOUS | 0 refills | Status: DC

## 2020-03-31 NOTE — Telephone Encounter (Signed)
Received call from [facility].   Reports that patient qualifies for [employer] Assistance/ post discharge personal care services. Reports that patient is requesting orders for services.   Case ID: [id number]  Direct Line: [phone]~ telephone  Left message for member services to return call.

## 2020-03-31 NOTE — Telephone Encounter (Signed)
Medication is ordered as Lasix 40mg .   I do not see where it has been decreased.   Please advise.

## 2020-03-31 NOTE — Telephone Encounter (Signed)
Refill Lasix 20 mg

## 2020-03-31 NOTE — Progress Notes (Signed)
Subjective:    Patient ID: Ebony Scott, female    DOB: [birth year], 70 y.o.   MRN: [id number]  HPI  Admit date: 02/23/2020 Discharge date: 02/29/2020  Admitted From: home Disposition:  home  Recommendations for Outpatient Follow-up:  1. Follow up with PCP in 1-2 weeks 2. 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 She had recently undergone right shoulder arthroplasty and had been started on baclofen. Encephalopathy was thought to be secondary to baclofen, improving after baclofen was discontinued.  Discharge Diagnoses:  Principal Problem:   Metabolic encephalopathy Active Problems:   Hyperlipidemia   NECK PAIN, CHRONIC   LPRD (laryngopharyngeal reflux disease)   History of breast cancer   S/P reverse total shoulder arthroplasty, right  Acute metabolic encephalopathy Improved off baclofen  Disposition Declines referral to SNF as recommended by PT Patient can be discharged tomorrow once her daughter returns and can be at home to help her  History of asthma/tracheobronchomalacia CPAP at night   From previous note by Dr. Owens Shark: CKD stage IIIa Will closely monitor BMP. Creatinineimproving, monitor  Rheumatoid arthritison chronic steroid. Continued home medicatoins.  Right shoulder arthroplasty on 02/19/2020. With extensive old bruise on the arm. No tense hematoma or signs of infection. Hemoglobin stable. Seen by orthopedics. On Xarelto, will continue for now We will add a Lidoderm patch, ice compression and arm sling.  History of DVT and PE.On xarelto as outpatient. Likely on continued Xarelto secondary to history of breast cancer with  PE.  Chest x-ray with bibasilar atelectasis.Afebrile, no leukocytosis. No symptoms. No need for antibiotics. Lactate was 1.3. Blood cultures negative.  Hypokalemia.Resolved.Magnesium of 2.0.   Hypernatremia.Resolved.   Elevated AST/ALT. T bilirubin improved, likely medication induced, monitor, if not improving as outpatient recommend repeat viral testing (negative Hep C, Hep B, Hep A in past)   Macrocytic anemia-likely in part from leflunomide. B12 elevated, folate normal.   03/21/20 Patient is here today for hospital follow-up.  Apparently after her discharge from her shoulder surgery, she was taking baclofen 3 times a day on a scheduled basis.  This caused her to be extremely lightheaded and fall and also led to somnolence and lethargy.  Her son found her unresponsive in the floor and took her to the hospital.  She was in the hospital several days and after they held the baclofen and the medicine cleared her system, the patient began to regain her normal level of alertness.  She is here today for follow-up.  MRI of the brain in the hospital revealed mild chronic microvascular changes and cerebral atrophy.  Chest x-ray showed layering effusions suggesting atelectasis.  Lab work showed mild elevations in her liver function test and also a mild elevation in her white blood cell count.  She is chronically on 15 mg of prednisone a day in addition to [place] for rheumatoid arthritis.  She has been discharged home.  She is receiving physical therapy at home.  Over the last few days she has developed a worsening cough.  Today in the office her cough is productive of yellowish-brown bloody sputum.  On pulmonary exam she has pronounced left basilar posterior crackles markedly asymmetric compared to the right side.  I am concerned about pneumonia.  She  also reports worsening shortness of breath.  At that time, my plan was: I am very concerned with the recent hospital admission, the abnormal  breath sounds, her elevated white blood cell count, and her cough productive of purulent sputum that she is developing pneumonia.  Given her complicated medical history I feel that we need to cover for Pseudomonas so we will start her on Levaquin 500 mg daily for 7 days and obtain a chest x-ray as soon as possible.  Repeat CBC to monitor her leukocytosis.  Repeat CMP to monitor her liver function test.  She is on allopurinol, Arava, atorvastatin, and Tylenol all of which can irritate the liver.  I suspect that this is due to polypharmacy.  In the past I have held her statin and the elevated liver function test improved.  However given her chronic microvascular changes and cerebral atrophy, I feel it is important that we keep her on a statin to prevent future mini strokes and potentially vascular dementia.  Therefore I will await the results of her liver function test and potentially try to get her to stop allopurinol and/or Tylenol prior to stopping atorvastatin.  Await the results of the chest x-ray  03/25/20 I obtained a chest x-ray due to the concern for pneumonia at her last visit.  There was no evidence of left lower lobe pneumonia on the chest x-ray however there was a suspicious opacity in the right middle lung prompting [place] to order a CAT scan.  I have copied the findings of the CAT scan below:  IMPRESSION: 1. Multifocal areas of nodularity and or mass with other areas of consolidative change. Findings are suspicious for metastatic disease with infection as a differential consideration and should be excluded given patient history of anti TNF medication and immunosuppression. Pulmonary consultation is suggested. 2. Multifocal areas of sclerosis involving the ribs bilaterally, some areas associated with subacute rib fracture, others without signs of rib fracture. Segmental appearance is suspicious for metastatic disease in this patient with prior lumpectomy. For above findings in both the ribs and  lung parenchyma, PET scan may be helpful. 3. Three-vessel coronary artery disease. 4. Atherosclerosis.  Patient is here today for follow-up.  She continues to have a cough productive of brown sputum.  She continues to have shortness of breath with activity however she states that she feels some better than she did last week.  She does continue to have occasional fevers.  She denies any pleurisy or hemoptysis.  Today she is afebrile and her oxygen is 94% on room air.  However she still has very prominent crackles and rhonchi in the left lower base.  I am unable to appreciate any sounds in the right lung that are atypical.  I reviewed the CAT scan personally with the patient.  There is a large opacity in the left lower lobe with different levels of contrast-enhancement as well as air.  I am concerned about an atypical pneumonia in this area.  The opacities in the right lobe show inflammation and streaking as well.  Therefore the question is whether the patient has multifocal atypical pneumonia, Boop, or metastatic cancer.  At that time, my plan was: I am going to treat the patient for atypical pneumonia.  Switch the patient to Cefpodoxime 200 mg twice daily for 10 days to cover Streptococcus gram-negative's and anaerobics as well as add a Z-Pak to cover atypical bacteria.  Consult pulmonology as soon as possible.  I believe the patient needs BAL for culture and  sensitivity as well as possible tissue biopsy.  I will defer to their expert opinion as to whether the PET scan is necessary or whether we should go straight to biopsy to determine the next course of action.  03/31/20 Patient has an appoint with her pulmonologist tomorrow.  Unfortunately she is seen no benefit from the antibiotics.  She continues to cough all throughout our encounter.  She continues to have shortness of breath with activity.  She denies any pleurisy or hemoptysis.  Her oxygen remained stable and she is afebrile.  Today on exam however  her lungs sound better than last week.  I do not appreciate the crackles in the left lower lobe that I appreciated last week.  Therefore subjectively her lungs do sound better.  Patient states that because of the antibiotic she has been having diarrhea and as result she is had bleeding hemorrhoids.  Her last hemoglobin was 9.  She is on blood thinners due to her history of DVT.  Her last DVT/PE was 2 years ago.  She is currently taking it indefinitely for prophylaxis.  However she reports significant bleeding due to her hemorrhoids.  She also wants to go back on her Tylenol for pain. Past Medical History:  Diagnosis Date  . Acute deep vein thrombosis (DVT) of right lower extremity ([place]) 12/13/2017  . Allergic rhinitis   . Allergy    SEASONAL  . Anemia   . Anxiety    pt denies  . Arthritis    Phreesia 10/21/2019  . Asthma   . Asthma    Phreesia 10/21/2019  . Breast cancer ([place]) 1998   in remission  . Cataract    REMOVED  . Clostridium difficile colitis 01/14/2018  . Clostridium difficile diarrhea 01/14/2018  . Complication of anesthesia    "had hard time waking up from it several times" (02/20/2012)  . Depression    "some; don't take anything for it" (02/20/2012)  . Diverticulosis   . DVT (deep venous thrombosis) ([place])   . Exertional dyspnea   . Fibromyalgia 11/2011  . GERD (gastroesophageal reflux disease)   . Graves disease   . Headache(784.0)    "related to allergies; more at different times during the year" (02/20/2012)  . Hemorrhoids   . Hiatal hernia    back and neck  . Hx of adenomatous colonic polyps 04/12/2016  . Hypercholesteremia    good cholesterol is high  . Hypothyroidism   . IBS (irritable bowel syndrome)   . Moderate persistent asthma    -FeV1 72% 2011, -IgE 102 2011, CT sinus Neg 2011  . Osteoporosis    on reclast yearly  . Peripheral vascular disease ([place]) 2019   DVTs  . Pneumonia 04/2011; ~ 11/2011   "double; single" (02/20/2012)  . Seronegative rheumatoid  arthritis ([place])    Dr. Lahoma Rocker  . SIRS (systemic inflammatory response syndrome) ([place]) 02/10/2018  . Tracheobronchomalacia    Past Surgical History:  Procedure Laterality Date  . ABDOMINAL HYSTERECTOMY N/A    Phreesia 10/21/2019  . ANTERIOR AND POSTERIOR REPAIR  1990's  . APPENDECTOMY    . BREAST LUMPECTOMY  1998   left  . BREAST SURGERY N/A    Phreesia 10/21/2019  . CARPOMETACARPEL ([place]) FUSION OF THUMB WITH AUTOGRAFT FROM RADIUS  ~ 2009   "both thumbs" (02/20/2012)  . CATARACT EXTRACTION W/ INTRAOCULAR LENS  IMPLANT, BILATERAL  2012  . CERVICAL DISCECTOMY  10/2001   C5-C6  . CERVICAL FUSION  2003   C3-C4  .  CHOLECYSTECTOMY    . COLONOSCOPY    . DEBRIDEMENT TENNIS ELBOW  ?1970's   right  . ESOPHAGOGASTRODUODENOSCOPY    . EYE SURGERY N/A    Phreesia 10/21/2019  . HYSTERECTOMY    . KNEE ARTHROPLASTY  ?1990's   "?right; w/cartilage repair" (02/20/2012)  . NASAL SEPTUM SURGERY  1980's  . POSTERIOR CERVICAL FUSION/FORAMINOTOMY  2004   "failed initial fusion; rewired  anterior neck" (02/20/2012)  . REVERSE SHOULDER ARTHROPLASTY Right 02/16/2020   Procedure: REVERSE SHOULDER ARTHROPLASTY;  Surgeon: Marchia Bond, MD;  Location: [facility];  Service: Orthopedics;  Laterality: Right;  . SPINE SURGERY N/A    Phreesia 10/21/2019  . TONSILLECTOMY  ~ 1953  . VESICOVAGINAL FISTULA CLOSURE W/ TAH  1988  . VIDEO BRONCHOSCOPY Bilateral 08/23/2016   Procedure: VIDEO BRONCHOSCOPY WITH FLUORO;  Surgeon: Javier Glazier, MD;  Location: [facility];  Service: Cardiopulmonary;  Laterality: Bilateral;   Current Outpatient Medications on File Prior to Visit  Medication Sig Dispense Refill  . acetaminophen (TYLENOL) 650 MG CR tablet Take 1,300 mg by mouth every 8 (eight) hours as needed for pain.    Marland Kitchen albuterol (PROVENTIL) (2.5 MG/3ML) 0.083% nebulizer solution Take 3 mLs (2.5 mg total) by nebulization in the morning and at bedtime. Along with flutter valve 180 mL 5  . allopurinol (ZYLOPRIM)  100 MG tablet Take 100 mg by mouth daily.    . Alpha-D-Galactosidase (BEANO PO) Take 2-3 capsules by mouth 3 (three) times daily with meals.    . Alpha-Lipoic Acid 600 MG CAPS Take 600 mg by mouth daily.     Marland Kitchen ammonium lactate (AMLACTIN) 12 % cream Apply topically as needed for dry skin. (Patient taking differently: Apply 1 g topically at bedtime.) 385 g 2  . atorvastatin (LIPITOR) 10 MG tablet TAKE 1 TABLET BY MOUTH DAILY 90 tablet 3  . azelastine (ASTELIN) 0.1 % nasal spray USE 2 SPRAYS IN EACH NOSTRIL DAILY AS DIRECTED (Patient taking differently: Place 2 sprays into both nostrils daily as needed for allergies.) 30 mL 5  . azithromycin (ZITHROMAX) 250 MG tablet 2 tabs poqday1, 1 tab poqday 2-5 6 tablet 0  . CALCIUM-MAGNESIUM PO Take 1 tablet by mouth 2 (two) times daily.     . cefpodoxime (VANTIN) 200 MG tablet Take 1 tablet (200 mg total) by mouth 2 (two) times daily. 20 tablet 0  . cephALEXin (KEFLEX) 500 MG capsule Take 500 mg by mouth 3 (three) times daily. 10 DS (Patient not taking: Reported on 03/25/2020)    . cetirizine (ZYRTEC) 10 MG tablet Take 10 mg by mouth daily.    . cholecalciferol (VITAMIN D) 25 MCG (1000 UT) tablet Take 1,000 Units by mouth daily.     . cyproheptadine (PERIACTIN) 4 MG tablet TAKE 2 TABLETS BY MOUTH EVERY EVENING (Patient taking differently: Take 8 mg by mouth every evening.) 180 tablet 2  . denosumab (PROLIA) 60 MG/ML SOLN injection Inject 60 mg into the skin every 6 (six) months. Administer in upper arm, thigh, or abdomen (Patient not taking: Reported on 03/25/2020)    . DEXILANT 60 MG capsule TAKE 1 CAPSULE BY MOUTH EACH MORNING (Patient taking differently: Take 60 mg by mouth daily.) 90 capsule 3  . diclofenac Sodium (VOLTAREN) 1 % GEL APPLY 2 GRAMS TOPICALLY TWICE DAILY (Patient taking differently: Apply 1 application topically 4 (four) times daily as needed (joint pain).) 350 g 3  . DULERA 200-5 MCG/ACT AERO INHALE 2 PUFFS BY MOUTH TWICE DAILY (Patient taking  differently:  Inhale 2 puffs into the lungs 2 (two) times daily.) 39 g 1  . EPINEPHrine (EPIPEN 2-PAK) 0.3 mg/0.3 mL IJ SOAJ injection Inject 0.3 mLs (0.3 mg total) into the muscle Once PRN. (Patient taking differently: Inject 0.3 mg into the muscle as needed for anaphylaxis.) 0.3 mL 2  . famotidine (PEPCID) 20 MG tablet TAKE 1 TABLET BY MOUTH TWICE A DAY (Patient taking differently: Take 20 mg by mouth 2 (two) times daily.) 60 tablet 5  . fluticasone (FLONASE) 50 MCG/ACT nasal spray Place 2 sprays into both nostrils daily. 16 g 2  . folic acid (FOLVITE) 1 MG tablet Take 1 mg by mouth daily.    . furosemide (LASIX) 40 MG tablet TAKE 1 TABLET BY MOUTH DAILY AS NEEDED 30 tablet 3  . gabapentin (NEURONTIN) 600 MG tablet TAKE 1 TABLET BY MOUTH DAILY (Patient taking differently: Take 600 mg by mouth daily.) 90 tablet 3  . guaiFENesin (MUCINEX) 600 MG 12 hr tablet Take 600 mg by mouth 2 (two) times daily.    . Lactase 9000 units TABS Take 18,000-27,000 Units by mouth 4 (four) times daily as needed (dairy consumption).    Marland Kitchen leflunomide (ARAVA) 20 MG tablet Take 20 mg by mouth daily.    Marland Kitchen levofloxacin (LEVAQUIN) 500 MG tablet Take 1 tablet (500 mg total) by mouth daily. 7 tablet 0  . lidocaine (LIDODERM) 5 % Place 1 patch onto the skin daily as needed (pain). Remove & Discard patch within 12 hours or as directed by MD    . mirtazapine (REMERON SOL-TAB) 30 MG disintegrating tablet DISSOLVE 1 TABLET BY MOUTH EVERY NIGHT AT BEDTIME (Patient taking differently: Take 30 mg by mouth at bedtime.) 90 tablet 1  . MITIGARE 0.6 MG CAPS Take 0.6 mg by mouth daily as needed (inflammation & pain).    . montelukast (SINGULAIR) 10 MG tablet TAKE 1 TABLET BY MOUTH DAILY (Patient taking differently: Take 10 mg by mouth every evening.) 30 tablet 5  . mupirocin cream (BACTROBAN) 2 % Apply 1 application topically 2 (two) times daily. (Patient not taking: Reported on 03/25/2020) 15 g 0  . Nystatin (GERHARDT'S BUTT CREAM) CREA Apply  1 application topically daily as needed (hemorrhoids). NYSTATIN 100,000 UNIT/GM CR 100000 CRM- 60 g, ZINC OXIDE 20 % OINT 20 OINT- 60 g, HYDROCORTISONE 1 % CREAM- 60 g 1 each 11  . nystatin (MYCOSTATIN) 100000 UNIT/ML suspension SWISH AND SPIT 5 MLS TWICE DAILY 473 mL 3  . nystatin cream (MYCOSTATIN) Apply 1 application topically 2 (two) times daily.    Marland Kitchen nystatin-triamcinolone ointment (MYCOLOG) Apply topically 2 (two) times daily. (Patient taking differently: Apply 1 application topically 2 (two) times daily as needed (yeast infections).) 90 g 1  . ondansetron (ZOFRAN) 4 MG tablet Take 1 tablet (4 mg total) by mouth every 8 (eight) hours as needed for nausea or vomiting. 10 tablet 0  . ondansetron (ZOFRAN) 4 MG tablet Take 1 tablet (4 mg total) by mouth every 8 (eight) hours as needed for nausea or vomiting. (Patient not taking: Reported on 03/25/2020) 20 tablet 0  . predniSONE (DELTASONE) 5 MG tablet Take 15 mg by mouth daily with breakfast.    . Prenatal Vit-Fe Fumarate-FA (PRENATAL MULTIVITAMIN) TABS tablet Take 1 tablet by mouth daily.    . Probiotic Product (ALIGN PO) Take 1 capsule by mouth daily.    Marland Kitchen Propylene Glycol (SYSTANE COMPLETE) 0.6 % SOLN Place 1 drop into both eyes 2 (two) times daily.    Marland Kitchen Respiratory  Therapy Supplies (FLUTTER) DEVI Use as directed 1 each 0  . Salicylic Acid-Urea (KERASAL EX) Apply 1 application topically at bedtime.    . sodium chloride HYPERTONIC 3 % nebulizer solution Take by nebulization 2 (two) times daily. (Patient taking differently: Take 4 mLs by nebulization 2 (two) times daily.) 750 mL 12  . Spacer/Aero-Holding Chambers (AEROCHAMBER PLUS) inhaler Use as instructed 1 each 0  . Tiotropium Bromide Monohydrate (SPIRIVA RESPIMAT) 2.5 MCG/ACT AERS Inhale 1 puff into the lungs daily. 4 g 11  . traMADol (ULTRAM) 50 MG tablet Take 1 tablet (50 mg total) by mouth every 6 (six) hours as needed. (Patient not taking: Reported on 03/25/2020) 30 tablet 0  . XARELTO 20 MG  TABS tablet TAKE 1 TABLET BY MOUTH DAILY WITH SUPPER (Patient taking differently: Take 20 mg by mouth every evening.) 90 tablet 3   Current Facility-Administered Medications on File Prior to Visit  Medication Dose Route Frequency Provider Last Rate Last Admin  . denosumab (PROLIA) injection 60 mg  60 mg Subcutaneous Q6 months [name], MD   60 mg at 09/23/19 1108  . mupirocin cream (BACTROBAN) 2 %   Topical BID [name], MD       Allergies  Allergen Reactions  . Dust Mite Extract Shortness Of Breath and Other (See Comments)    "sneezing" (02/20/2012)  . Molds & Smuts Shortness Of Breath  . Morphine And Related Hives and Itching  . Other Shortness Of Breath and Other (See Comments)    Grass and weeds "sneezing; filled sinuses" (02/20/2012)  . Penicillins Rash and Other (See Comments)    "welts" (02/20/2012) Has patient had a PCN reaction causing immediate rash, facial/tongue/throat swelling, SOB or lightheadedness with hypotension: Unknown Has patient had a PCN reaction causing severe rash involving mucus membranes or skin necrosis: No Has patient had a PCN reaction that required hospitalization: No Has patient had a PCN reaction occurring within the last 10 years: No If all of the above answers are "NO", then may proceed with Cephalosporin use. Tolerated Ancef 02/16/20.   Marland Kitchen Reclast [Zoledronic Acid] Other (See Comments)    Fever, Put in hospital, dr said it was a reaction from a reaction   . Rofecoxib Swelling    Vioxx REACTION: feet swelling  . Shellfish Allergy Anaphylaxis, Shortness Of Breath, Itching and Rash  . Tetracycline Hcl Nausea And Vomiting  . Xolair [Omalizumab] Other (See Comments)    Caused Blood clot  . Dilaudid [Hydromorphone Hcl] Itching  . Hydrocodone-Acetaminophen Nausea And Vomiting  . Levofloxacin Other (See Comments)    GI upset  . Oxycodone Hcl Nausea And Vomiting  . Paroxetine Nausea And Vomiting    Paxil   . Baclofen Other (See Comments)     Altered mental status requiring 3 day hospital stay   . Celecoxib Swelling    Feet swelling  . Diltiazem Swelling  . Lactose Intolerance (Gi) Other (See Comments)    Bloating and gas  . Tree Extract Other (See Comments)    "tested and told I was allergic to it; never experienced a reaction to it" (02/20/2012)   Social History   Socioeconomic History  . Marital status: Divorced    Spouse name: Not on file  . Number of children: 2  . Years of education: college  . Highest education level: Not on file  Occupational History  . Occupation: Disabled    Comment: Retired Engineer, production: RETIRED  Tobacco Use  .  Smoking status: Passive Smoke Exposure - Never Smoker  . Smokeless tobacco: Never Used  . Tobacco comment: Parents  Vaping Use  . Vaping Use: Never used  Substance and Sexual Activity  . Alcohol use: Not Currently    Alcohol/week: 0.0 standard drinks  . Drug use: No  . Sexual activity: Never  Other Topics Concern  . Not on file  Social History Narrative   Patient lives at home alone. Patient  divorced.    Patient has her BS degree.   Right handed.   Caffeine- sometimes coffee.      [facility]:   Born in [place], [place]. She worked as a Copywriter, advertising. She has no pets currently. She does have indoor plants. Previously had mold in her home that was remediated. Carpet was removed.          Social Determinants of Health   Financial Resource Strain: Not on file  Food Insecurity: Not on file  Transportation Needs: Not on file  Physical Activity: Not on file  Stress: Not on file  Social Connections: Not on file  Intimate Partner Violence: Not on file     Review of Systems  All other systems reviewed and are negative.      Objective:   Physical Exam Vitals reviewed.  Constitutional:      General: She is not in acute distress.    Appearance: Normal appearance. She is well-developed. She is not ill-appearing or  toxic-appearing.  HENT:     Right Ear: Tympanic membrane and ear canal normal.     Left Ear: Tympanic membrane and ear canal normal.     Nose: No congestion or rhinorrhea.  Cardiovascular:     Rate and Rhythm: Normal rate and regular rhythm.     Heart sounds: Normal heart sounds.  Pulmonary:     Effort: Pulmonary effort is normal. No respiratory distress.     Breath sounds: No stridor or decreased air movement. No wheezing.  Abdominal:     General: Bowel sounds are normal. There is no distension.     Palpations: Abdomen is soft.     Tenderness: There is no abdominal tenderness. There is no guarding.  Musculoskeletal:     Right lower leg: Edema present.     Left lower leg: Edema present.  Skin:    Findings: No erythema.  Neurological:     Mental Status: She is alert and oriented to person, place, and time.     Cranial Nerves: No cranial nerve deficit.     Sensory: No sensory deficit.     Motor: No weakness.     Coordination: Coordination normal.   Moon facies, buffalo hump       Assessment & Plan:  Elevated LFTs - Plan: CBC with Differential/Platelet, COMPLETE METABOLIC PANEL WITH GFR  Osteoporosis, unspecified osteoporosis type, unspecified pathological fracture presence  Mass of left lung  Stage 3b chronic kidney disease (HCC)  Recheck CMP today.  If liver function tests remain elevated do not resume Tylenol.  Check CBC today.  If hemoglobin has dropped further she will need to temporarily hold Xarelto.  Treat hemorrhoids with hydrocortisone 2.5% cream twice daily.  Await the results of her consultation with her pulmonologist.  Subjectively she states that she is not much better however objectively on her exam the crackles in her left base have improved.  I see this as reassuring.

## 2020-04-01 ENCOUNTER — Other Ambulatory Visit: Payer: Self-pay | Admitting: Pulmonary Disease

## 2020-04-01 ENCOUNTER — Encounter: Payer: Self-pay | Admitting: Family Medicine

## 2020-04-01 ENCOUNTER — Telehealth: Payer: Self-pay | Admitting: Family Medicine

## 2020-04-01 ENCOUNTER — Encounter: Payer: Self-pay | Admitting: Pulmonary Disease

## 2020-04-01 ENCOUNTER — Telehealth: Payer: Self-pay | Admitting: Pulmonary Disease

## 2020-04-01 ENCOUNTER — Ambulatory Visit: Payer: Medicare PPO | Admitting: Pulmonary Disease

## 2020-04-01 ENCOUNTER — Other Ambulatory Visit: Payer: Self-pay | Admitting: Family Medicine

## 2020-04-01 VITALS — BP 144/72 | HR 107 | Temp 97.1°F | Ht 61.0 in | Wt 136.8 lb

## 2020-04-01 DIAGNOSIS — R918 Other nonspecific abnormal finding of lung field: Secondary | ICD-10-CM

## 2020-04-01 DIAGNOSIS — D849 Immunodeficiency, unspecified: Secondary | ICD-10-CM

## 2020-04-01 DIAGNOSIS — J479 Bronchiectasis, uncomplicated: Secondary | ICD-10-CM | POA: Diagnosis not present

## 2020-04-01 DIAGNOSIS — R059 Cough, unspecified: Secondary | ICD-10-CM

## 2020-04-01 DIAGNOSIS — J189 Pneumonia, unspecified organism: Secondary | ICD-10-CM | POA: Diagnosis not present

## 2020-04-01 DIAGNOSIS — Z5181 Encounter for therapeutic drug level monitoring: Secondary | ICD-10-CM

## 2020-04-01 DIAGNOSIS — J398 Other specified diseases of upper respiratory tract: Secondary | ICD-10-CM | POA: Diagnosis not present

## 2020-04-01 LAB — COMPLETE METABOLIC PANEL WITH GFR
AG Ratio: 1.4 (calc) (ref 1.0–2.5)
ALT: 62 U/L — ABNORMAL HIGH (ref 6–29)
AST: 48 U/L — ABNORMAL HIGH (ref 10–35)
Albumin: 3.5 g/dL — ABNORMAL LOW (ref 3.6–5.1)
Alkaline phosphatase (APISO): 76 U/L (ref 37–153)
BUN/Creatinine Ratio: 26 (calc) — ABNORMAL HIGH (ref 6–22)
BUN: 31 mg/dL — ABNORMAL HIGH (ref 7–25)
CO2: 28 mmol/L (ref 20–32)
Calcium: 9.2 mg/dL (ref 8.6–10.4)
Chloride: 99 mmol/L (ref 98–110)
Creat: 1.19 mg/dL — ABNORMAL HIGH (ref 0.60–0.93)
GFR, Est African American: 53 mL/min/{1.73_m2} — ABNORMAL LOW (ref >=60)
GFR, Est Non African American: 46 mL/min/{1.73_m2} — ABNORMAL LOW (ref >=60)
Globulin: 2.5 g/dL (calc) (ref 1.9–3.7)
Glucose, Bld: 118 mg/dL — ABNORMAL HIGH (ref 65–99)
Potassium: 4.1 mmol/L (ref 3.5–5.3)
Sodium: 140 mmol/L (ref 135–146)
Total Bilirubin: 0.5 mg/dL (ref 0.2–1.2)
Total Protein: 6 g/dL — ABNORMAL LOW (ref 6.1–8.1)

## 2020-04-01 LAB — CBC WITH DIFFERENTIAL/PLATELET
Absolute Monocytes: 1308 cells/uL — ABNORMAL HIGH (ref 200–950)
Basophils Absolute: 44 cells/uL (ref 0–200)
Basophils Relative: 0.3 %
Eosinophils Absolute: 15 cells/uL (ref 15–500)
Eosinophils Relative: 0.1 %
HCT: 28.8 % — ABNORMAL LOW (ref 35.0–45.0)
Hemoglobin: 9.5 g/dL — ABNORMAL LOW (ref 11.7–15.5)
Lymphs Abs: 456 cells/uL — ABNORMAL LOW (ref 850–3900)
MCH: 34.8 pg — ABNORMAL HIGH (ref 27.0–33.0)
MCHC: 33 g/dL (ref 32.0–36.0)
MCV: 105.5 fL — ABNORMAL HIGH (ref 80.0–100.0)
MPV: 10.7 fL (ref 7.5–12.5)
Monocytes Relative: 8.9 %
Neutro Abs: 12877 cells/uL — ABNORMAL HIGH (ref 1500–7800)
Neutrophils Relative %: 87.6 %
Platelets: 251 10*3/uL (ref 140–400)
RBC: 2.73 10*6/uL — ABNORMAL LOW (ref 3.80–5.10)
RDW: 15.2 % — ABNORMAL HIGH (ref 11.0–15.0)
Total Lymphocyte: 3.1 %
WBC: 14.7 10*3/uL — ABNORMAL HIGH (ref 3.8–10.8)

## 2020-04-01 MED ORDER — FUROSEMIDE 20 MG PO TABS: 20.0000 mg | ORAL_TABLET | Freq: Every evening | ORAL | 3 refills | Status: DC

## 2020-04-01 MED ORDER — FUROSEMIDE 40 MG PO TABS: 40.0000 mg | ORAL_TABLET | Freq: Every day | ORAL | 3 refills | Status: DC | PRN

## 2020-04-01 NOTE — Progress Notes (Signed)
Synopsis: Referred in 2011 for asthma by Ebony Frizzle, MD.  Previously a patient of Drs. Sunnie Nielsen, McQuaid and [place].  Subjective:   PATIENT ID: Ebony Scott GENDER: female DOB: 09/01/1950, MRN: [id number]   HPI  Chief Complaint  Patient presents with  . Asthma    PCP order CT chest on 03/25/20, here to review this.    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   CT scan 03/25/2020 showed multifocal areas of nodularity and consolidative changes. Findings consistent with atypical infection or possible malignancy. She was started on azithromycin and cefpodoxime by Dr. Dennard Schaumann. She has completed the azithromycin and she started the cefpodoxime on 2/7. She was treated with levaquin on 03/21/20 for 7 days initially until the CT scan resulted. Blood cultures were negative on 02/23/20 when she presented with AMS.   She reports she is coughing up brown sputum. At baseline she doe snot cough up sputum or it is clear. The sputum amount has declined she taking antibiotics. She denies fevers, chills or sweats. Her appetite is well.    OV 09/01/19: Ebony Scott is a 70 y/o woman who presents for follow up of bronchiectasis, tracheobronchomalacia, immunosuppression, chronic AC for recurrent VTE. She started prescribed levofloxacin last evening for Pseudomonas bronchiectasis AE. So far she has not had nausea associated with taking this med, but took zofran prophylacticaly. She remains SOB with no change so far in her symptoms. She still coughs frequently despite using her inhaler and hypertonic saline nebs + flutter as prescribed. She has follow up with her PCP soon for hyperglycemiaa. She has follow up with her rheumatologist in August and is hoping that her prednisone will be able to  be decreased soon.  Past Medical History:  Diagnosis Date  . Acute deep vein thrombosis (DVT) of right lower extremity ([place]) 12/13/2017  . Allergic rhinitis   . Allergy    SEASONAL  . Anemia   . Anxiety    pt denies  . Arthritis    Phreesia 10/21/2019  . Asthma   . Asthma    Phreesia 10/21/2019  . Breast cancer ([place]) 1998   in remission  . Cataract    REMOVED  . Clostridium difficile colitis 01/14/2018  . Clostridium difficile diarrhea 01/14/2018  . Complication of anesthesia    "had hard time waking up from it several times" (02/20/2012)  . Depression    "some; don't take anything for it" (02/20/2012)  . Diverticulosis   . DVT (deep venous thrombosis) ([place])   . Exertional dyspnea   . Fibromyalgia 11/2011  . GERD (gastroesophageal reflux disease)   . Graves disease   . Headache(784.0)    "related to allergies; more at different times during the year" (02/20/2012)  . Hemorrhoids   . Hiatal hernia    back and neck  . Hx of adenomatous colonic polyps 04/12/2016  . Hypercholesteremia    good cholesterol is high  . Hypothyroidism   . IBS (irritable bowel syndrome)   . Moderate persistent asthma    -FeV1 72% 2011, -IgE 102 2011, CT sinus Neg 2011  . Osteoporosis    on reclast yearly  . Peripheral vascular disease ([place]) 2019   DVTs  . Pneumonia 04/2011; ~ 11/2011   "double; single" (02/20/2012)  . Seronegative rheumatoid arthritis (  [place])    Dr. Lahoma Rocker  . SIRS (systemic inflammatory response syndrome) ([place]) 02/10/2018  . Tracheobronchomalacia      Family History  Problem Relation Age of Onset  . Allergies Mother   . Heart disease Mother   . Arthritis Mother   . Lung cancer Mother   . Diabetes Mother   . Allergies Father   . Heart disease Father   . Arthritis Father   . Stroke Father   . Colon cancer Other        Maternal half aunt/Maternal half uncle  . Colitis Daughter   . Diabetes Maternal Grandfather      Social History   Socioeconomic History  .  Marital status: Divorced    Spouse name: Not on file  . Number of children: 2  . Years of education: college  . Highest education level: Not on file  Occupational History  . Occupation: Disabled    Comment: Retired Engineer, production: RETIRED  Tobacco Use  . Smoking status: Passive Smoke Exposure - Never Smoker  . Smokeless tobacco: Never Used  . Tobacco comment: Parents  Vaping Use  . Vaping Use: Never used  Substance and Sexual Activity  . Alcohol use: Not Currently    Alcohol/week: 0.0 standard drinks  . Drug use: No  . Sexual activity: Never  Other Topics Concern  . Not on file  Social History Narrative   Patient lives at home alone. Patient  divorced.    Patient has her BS degree.   Right handed.   Caffeine- sometimes coffee.      [facility]:   Born in [place], [place]. She worked as a Copywriter, advertising. She has no pets currently. She does have indoor plants. Previously had mold in her home that was remediated. Carpet was removed.          Social Determinants of Health   Financial Resource Strain: Not on file  Food Insecurity: Not on file  Transportation Needs: Not on file  Physical Activity: Not on file  Stress: Not on file  Social Connections: Not on file  Intimate Partner Violence: Not on file     Allergies  Allergen Reactions  . Dust Mite Extract Shortness Of Breath and Other (See Comments)    "sneezing" (02/20/2012)  . Molds & Smuts Shortness Of Breath  . Morphine And Related Hives and Itching  . Other Shortness Of Breath and Other (See Comments)    Grass and weeds "sneezing; filled sinuses" (02/20/2012)  . Penicillins Rash and Other (See Comments)    "welts" (02/20/2012) Has patient had a PCN reaction causing immediate rash, facial/tongue/throat swelling, SOB or lightheadedness with hypotension: Unknown Has patient had a PCN reaction causing severe rash involving mucus membranes or skin necrosis: No Has patient had a PCN  reaction that required hospitalization: No Has patient had a PCN reaction occurring within the last 10 years: No If all of the above answers are "NO", then may proceed with Cephalosporin use. Tolerated Ancef 02/16/20.   Marland Kitchen Reclast [Zoledronic Acid] Other (See Comments)    Fever, Put in hospital, dr said it was a reaction from a reaction   . Rofecoxib Swelling    Vioxx REACTION: feet swelling  . Shellfish Allergy Anaphylaxis, Shortness Of Breath, Itching and Rash  . Tetracycline Hcl Nausea And Vomiting  . Xolair [Omalizumab] Other (See Comments)    Caused Blood clot  . Dilaudid [Hydromorphone Hcl] Itching  . Hydrocodone-Acetaminophen Nausea And Vomiting  .  Levofloxacin Other (See Comments)    GI upset  . Oxycodone Hcl Nausea And Vomiting  . Paroxetine Nausea And Vomiting    Paxil   . Baclofen Other (See Comments)    Altered mental status requiring 3 day hospital stay   . Celecoxib Swelling    Feet swelling  . Diltiazem Swelling  . Lactose Intolerance (Gi) Other (See Comments)    Bloating and gas  . Tree Extract Other (See Comments)    "tested and told I was allergic to it; never experienced a reaction to it" (02/20/2012)     Outpatient Medications Prior to Visit  Medication Sig Dispense Refill  . acetaminophen (TYLENOL) 650 MG CR tablet Take 1,300 mg by mouth every 8 (eight) hours as needed for pain.    Marland Kitchen albuterol (PROVENTIL) (2.5 MG/3ML) 0.083% nebulizer solution Take 3 mLs (2.5 mg total) by nebulization in the morning and at bedtime. Along with flutter valve 180 mL 5  . allopurinol (ZYLOPRIM) 100 MG tablet Take 100 mg by mouth daily.    . Alpha-D-Galactosidase (BEANO PO) Take 2-3 capsules by mouth 3 (three) times daily with meals.    . Alpha-Lipoic Acid 600 MG CAPS Take 600 mg by mouth daily.     Marland Kitchen ammonium lactate (AMLACTIN) 12 % cream Apply topically as needed for dry skin. (Patient taking differently: Apply 1 g topically at bedtime.) 385 g 2  . atorvastatin (LIPITOR) 10 MG  tablet TAKE 1 TABLET BY MOUTH DAILY 90 tablet 3  . azelastine (ASTELIN) 0.1 % nasal spray USE 2 SPRAYS IN EACH NOSTRIL DAILY AS DIRECTED (Patient taking differently: Place 2 sprays into both nostrils daily as needed for allergies.) 30 mL 5  . azithromycin (ZITHROMAX) 250 MG tablet 2 tabs poqday1, 1 tab poqday 2-5 6 tablet 0  . CALCIUM-MAGNESIUM PO Take 1 tablet by mouth 2 (two) times daily.     . cefpodoxime (VANTIN) 200 MG tablet Take 1 tablet (200 mg total) by mouth 2 (two) times daily. 20 tablet 0  . cephALEXin (KEFLEX) 500 MG capsule Take 500 mg by mouth 3 (three) times daily. 10 DS    . cetirizine (ZYRTEC) 10 MG tablet Take 10 mg by mouth daily.    . cholecalciferol (VITAMIN D) 25 MCG (1000 UT) tablet Take 1,000 Units by mouth daily.     . cyproheptadine (PERIACTIN) 4 MG tablet TAKE 2 TABLETS BY MOUTH EVERY EVENING (Patient taking differently: Take 8 mg by mouth every evening.) 180 tablet 2  . denosumab (PROLIA) 60 MG/ML SOLN injection Inject 60 mg into the skin every 6 (six) months. Administer in upper arm, thigh, or abdomen    . DEXILANT 60 MG capsule TAKE 1 CAPSULE BY MOUTH EACH MORNING (Patient taking differently: Take 60 mg by mouth daily.) 90 capsule 3  . diclofenac Sodium (VOLTAREN) 1 % GEL APPLY 2 GRAMS TOPICALLY TWICE DAILY (Patient taking differently: Apply 1 application topically 4 (four) times daily as needed (joint pain).) 350 g 3  . DULERA 200-5 MCG/ACT AERO INHALE 2 PUFFS BY MOUTH TWICE DAILY (Patient taking differently: Inhale 2 puffs into the lungs 2 (two) times daily.) 39 g 1  . EPINEPHrine (EPIPEN 2-PAK) 0.3 mg/0.3 mL IJ SOAJ injection Inject 0.3 mLs (0.3 mg total) into the muscle Once PRN. (Patient taking differently: Inject 0.3 mg into the muscle as needed for anaphylaxis.) 0.3 mL 2  . famotidine (PEPCID) 20 MG tablet TAKE 1 TABLET BY MOUTH TWICE A DAY (Patient taking differently: Take 20 mg by  mouth 2 (two) times daily.) 60 tablet 5  . fluticasone (FLONASE) 50 MCG/ACT nasal  spray Place 2 sprays into both nostrils daily. 16 g 2  . folic acid (FOLVITE) 1 MG tablet Take 1 mg by mouth daily.    Marland Kitchen gabapentin (NEURONTIN) 600 MG tablet TAKE 1 TABLET BY MOUTH DAILY (Patient taking differently: Take 600 mg by mouth daily.) 90 tablet 3  . guaiFENesin (MUCINEX) 600 MG 12 hr tablet Take 600 mg by mouth 2 (two) times daily.    . hydrocortisone 2.5 % cream Apply topically 2 (two) times daily. 30 g 0  . Lactase 9000 units TABS Take 18,000-27,000 Units by mouth 4 (four) times daily as needed (dairy consumption).    Marland Kitchen leflunomide (ARAVA) 20 MG tablet Take 20 mg by mouth daily.    Marland Kitchen levofloxacin (LEVAQUIN) 500 MG tablet Take 1 tablet (500 mg total) by mouth daily. 7 tablet 0  . lidocaine (LIDODERM) 5 % Place 1 patch onto the skin daily as needed (pain). Remove & Discard patch within 12 hours or as directed by MD    . mirtazapine (REMERON SOL-TAB) 30 MG disintegrating tablet DISSOLVE 1 TABLET BY MOUTH EVERY NIGHT AT BEDTIME (Patient taking differently: Take 30 mg by mouth at bedtime.) 90 tablet 1  . MITIGARE 0.6 MG CAPS Take 0.6 mg by mouth daily as needed (inflammation & pain).    . montelukast (SINGULAIR) 10 MG tablet TAKE 1 TABLET BY MOUTH DAILY (Patient taking differently: Take 10 mg by mouth every evening.) 30 tablet 5  . mupirocin cream (BACTROBAN) 2 % Apply 1 application topically 2 (two) times daily. 15 g 0  . Nystatin (GERHARDT'S BUTT CREAM) CREA Apply 1 application topically daily as needed (hemorrhoids). NYSTATIN 100,000 UNIT/GM CR 100000 CRM- 60 g, ZINC OXIDE 20 % OINT 20 OINT- 60 g, HYDROCORTISONE 1 % CREAM- 60 g 1 each 11  . nystatin (MYCOSTATIN) 100000 UNIT/ML suspension SWISH AND SPIT 5 MLS TWICE DAILY 473 mL 3  . nystatin cream (MYCOSTATIN) Apply 1 application topically 2 (two) times daily.    Marland Kitchen nystatin-triamcinolone ointment (MYCOLOG) Apply topically 2 (two) times daily. (Patient taking differently: Apply 1 application topically 2 (two) times daily as needed (yeast  infections).) 90 g 1  . ondansetron (ZOFRAN) 4 MG tablet Take 1 tablet (4 mg total) by mouth every 8 (eight) hours as needed for nausea or vomiting. 10 tablet 0  . ondansetron (ZOFRAN) 4 MG tablet Take 1 tablet (4 mg total) by mouth every 8 (eight) hours as needed for nausea or vomiting. 20 tablet 0  . predniSONE (DELTASONE) 5 MG tablet Take 15 mg by mouth daily with breakfast.    . Prenatal Vit-Fe Fumarate-FA (PRENATAL MULTIVITAMIN) TABS tablet Take 1 tablet by mouth daily.    . Probiotic Product (ALIGN PO) Take 1 capsule by mouth daily.    Marland Kitchen Propylene Glycol (SYSTANE COMPLETE) 0.6 % SOLN Place 1 drop into both eyes 2 (two) times daily.    Marland Kitchen Respiratory Therapy Supplies (FLUTTER) DEVI Use as directed 1 each 0  . Salicylic Acid-Urea (KERASAL EX) Apply 1 application topically at bedtime.    . sodium chloride HYPERTONIC 3 % nebulizer solution Take by nebulization 2 (two) times daily. (Patient taking differently: Take 4 mLs by nebulization 2 (two) times daily.) 750 mL 12  . Spacer/Aero-Holding Chambers (AEROCHAMBER PLUS) inhaler Use as instructed 1 each 0  . Tiotropium Bromide Monohydrate (SPIRIVA RESPIMAT) 2.5 MCG/ACT AERS Inhale 1 puff into the lungs daily. 4 g  11  . traMADol (ULTRAM) 50 MG tablet Take 1 tablet (50 mg total) by mouth every 6 (six) hours as needed. 30 tablet 0  . XARELTO 20 MG TABS tablet TAKE 1 TABLET BY MOUTH DAILY WITH SUPPER (Patient taking differently: Take 20 mg by mouth every evening.) 90 tablet 3  . furosemide (LASIX) 20 MG tablet Take 1 tablet (20 mg total) by mouth every evening. Take 40 mg in the AM, and take additional 20 mg in the PM for swelling 30 tablet 3  . furosemide (LASIX) 40 MG tablet TAKE 1 TABLET BY MOUTH DAILY AS NEEDED 30 tablet 3   Facility-Administered Medications Prior to Visit  Medication Dose Route Frequency Provider Last Rate Last Admin  . denosumab (PROLIA) injection 60 mg  60 mg Subcutaneous Q6 months Ebony Frizzle, MD   60 mg at 03/31/20 1224     Review of Systems  Constitutional: Negative for chills, fever, malaise/fatigue and weight loss.  HENT: Negative for congestion, sinus pain and sore throat.   Eyes: Negative.   Respiratory: Positive for cough, sputum production and wheezing. Negative for hemoptysis and shortness of breath.   Cardiovascular: Negative for chest pain, palpitations, orthopnea, claudication and leg swelling.  Gastrointestinal: Negative for abdominal pain, heartburn, nausea and vomiting.  Genitourinary: Negative.   Musculoskeletal: Negative for joint pain and myalgias.  Skin: Negative for rash.  Neurological: Negative for weakness.  Endo/Heme/Allergies: Negative.   Psychiatric/Behavioral: Negative.       Objective:   Vitals:   04/01/20 0953  BP: (!) 144/72  Pulse: (!) 107  Temp: (!) 97.1 F (36.2 C)  TempSrc: Temporal  SpO2: 94%  Weight: 136 lb 12.8 oz (62.1 kg)  Height: _0  (1.549 m)     Physical Exam Constitutional:      General: She is not in acute distress.    Appearance: She is not ill-appearing.  HENT:     Head: Normocephalic and atraumatic.  Eyes:     General: No scleral icterus.    Conjunctiva/sclera: Conjunctivae normal.     Pupils: Pupils are equal, round, and reactive to light.  Cardiovascular:     Rate and Rhythm: Normal rate and regular rhythm.     Pulses: Normal pulses.     Heart sounds: Normal heart sounds. No murmur heard.   Pulmonary:     Effort: Pulmonary effort is normal.     Breath sounds: Normal breath sounds. No wheezing, rhonchi or rales.  Abdominal:     General: Bowel sounds are normal.     Palpations: Abdomen is soft.  Musculoskeletal:     Right lower leg: No edema.     Left lower leg: No edema.  Lymphadenopathy:     Cervical: No cervical adenopathy.  Skin:    General: Skin is warm and dry.  Neurological:     General: No focal deficit present.     Mental Status: She is alert.  Psychiatric:        Mood and Affect: Mood normal.        Behavior:  Behavior normal.        Thought Content: Thought content normal.        Judgment: Judgment normal.    CBC    Component Value Date/Time   WBC 14.7 (H) 03/31/2020 1215   RBC 2.73 (L) 03/31/2020 1215   HGB 9.5 (L) 03/31/2020 1215   HCT 28.8 (L) 03/31/2020 1215   PLT 251 03/31/2020 1215   MCV 105.5 (H) 03/31/2020 1215  MCH 34.8 (H) 03/31/2020 1215   MCHC 33.0 03/31/2020 1215   RDW 15.2 (H) 03/31/2020 1215   LYMPHSABS 456 (L) 03/31/2020 1215   MONOABS 1.0 02/23/2020 2326   EOSABS 15 03/31/2020 1215   BASOSABS 44 03/31/2020 1215   BMP Latest Ref Rng & Units 03/31/2020 03/21/2020 02/28/2020  Glucose 65 - 99 mg/dL 118(H) 249(H) 95  BUN 7 - 25 mg/dL 31(H) 32(H) 14  Creatinine 0.60 - 0.93 mg/dL 1.19(H) 1.34(H) 1.12(H)  BUN/Creat Ratio 6 - 22 (calc) 26(H) 24(H) -  Sodium 135 - 146 mmol/L 140 141 143  Potassium 3.5 - 5.3 mmol/L 4.1 4.2 4.3  Chloride 98 - 110 mmol/L 99 100 114(H)  CO2 20 - 32 mmol/L 28 25 19(L)  Calcium 8.6 - 10.4 mg/dL 9.2 8.4(L) 8.5(L)   Chest imaging: Chest Imaging- films reviewed: CT chest wo contrast 03/25/2020 - multifocal areas of nodularity and consolidative changes. Findings consistent with atypical infection or possible malignancy.  CT high-resolution 10/24/2018-mild bronchiectasis right middle lobe, lingula, right lower lobe, but does taper more appropriately distally.  Persistent nodules in right middle lobe and lingula with mild tree-in-bud opacities.  No inter or intralobular septal thickening or significant groundglass.  Expiratory images with some mosaicism and significant bronchial and tracheal collapse, especially on the right.   HRCT chest 08/30/19: RUL TIB opacities, multilobar bronchiectasis. Tracheobronchomalacia progressed since previous CT scan. Subacute rib fractures- healing. CAD.  PFT: PFT Results Latest Ref Rng & Units 08/20/2019 09/18/2018 06/19/2017 08/08/2015 04/29/2015  FVC-Pre L 1.72 1.74 2.00 2.08 2.24  FVC-Predicted Pre % 66 66 74 75 81  FVC-Post  L 1.65 1.61 1.86 2.17 2.25  FVC-Predicted Post % 63 61 69 78 81  Pre FEV1/FVC % % 90 88 90 83 87  Post FEV1/FCV % % 90 91 93 87 84  FEV1-Pre L 1.55 1.53 1.80 1.73 1.95  FEV1-Predicted Pre % 79 76 88 82 92  FEV1-Post L 1.49 1.47 1.72 1.89 1.89  DLCO uncorrected ml/min/mmHg 11.33 13.51 13.68 12.36 12.99  DLCO UNC% % 64 77 66 60 63  DLCO corrected ml/min/mmHg 11.33 - - 13.76 12.87  DLCO COR %Predicted % 64 - - 66 62  DLVA Predicted % 83 92 85 94 81  TLC L 3.71 3.52 3.95 - 3.91  TLC % Predicted % 80 76 85 - 84  RV % Predicted % 76 79 86 - 83   2020-no significant obstruction, mild restriction, mild DLCO reduction.  Flow volume loops consistent with restriction.  2021-obstruction or bronchodilator reversibility.  No significant restriction.  Mild diffusion impairment.  Compared to 2020, mild improvement.  Previous respiratory cultures- all fungal and AFB negative from right middle lobe and lingula during bronchoscopy 08/2016, only positive for normal flora   Pathology 08/23/2016: BAL lingula-no AFB or fungus  Echocardiogram: Unable to review report from 02/2018.  08/05/2017 demonstrates LVEF 65 to 70%, grade 1 diastolic dysfunction.  Normal LA, RV, RA.  Trivial TR and MR, otherwise normal valves.  Echocardiogram 07/10/2019-LVEF greater than 75% with hyperdynamic function, moderate LVH with grade 1 diastolic dysfunction.  Normal LA, RV, RA.  Trivial AR, otherwise normal valves.  Allergy panel 04/02/2016-increased IgE for Aspergillus, otherwise negative Alpha-1 antitrypsin 170  Bronchoscopy 12/24/2018 (performed at [facility]) Neutrophils 12% Lymphocytes 1% Macrophages 79% Respiratory viral panel negative Unable to see PJP DNA Respiratory culture-1+ mixed gram-negative rods, normal flora Fungus culture negative AFB negative  08/25/19 sputum- Pan-sensitive Pseudomonas  Assessment & Plan:   Tracheobronchomalacia  Bronchiectasis without complication (  [place])  Immunocompromised  ([place])  Pneumonia due to infectious organism, unspecified laterality, unspecified part of lung  Discussion: Hinley Brimage is a 70 year old woman, never smoker with asthma, tracheobronchomalacia on CPAP with oxygen, bronchiectasis and rheumatoid arthritis on leflunamide who returns to pulmonary clinic for evaluation of pneumonia and abnormal CT chest scan after her recent surgery.   She has multiple areas bilateral of new consolidative lesions concerning for an infectious etiology based on the timeline of these new findings and given her immunosuppressed state. There is less concern for a malignancy.  She has been scheduled for bronchoscopy and BAL on 04/06/19. We will send BAL sample for cultures and cytology.   She is to complete her current antibiotic regimen of cefpodoxime. She has already completed azithromycin.   Follow up in 4 weeks.  Ebony Jackson, MD [facility] Office: [phone]   Current Outpatient Medications:  .  acetaminophen (TYLENOL) 650 MG CR tablet, Take 1,300 mg by mouth every 8 (eight) hours as needed for pain., Disp: , Rfl:  .  albuterol (PROVENTIL) (2.5 MG/3ML) 0.083% nebulizer solution, Take 3 mLs (2.5 mg total) by nebulization in the morning and at bedtime. Along with flutter valve, Disp: 180 mL, Rfl: 5 .  allopurinol (ZYLOPRIM) 100 MG tablet, Take 100 mg by mouth daily., Disp: , Rfl:  .  Alpha-D-Galactosidase (BEANO PO), Take 2-3 capsules by mouth 3 (three) times daily with meals., Disp: , Rfl:  .  Alpha-Lipoic Acid 600 MG CAPS, Take 600 mg by mouth daily. , Disp: , Rfl:  .  ammonium lactate (AMLACTIN) 12 % cream, Apply topically as needed for dry skin. (Patient taking differently: Apply 1 g topically at bedtime.), Disp: 385 g, Rfl: 2 .  atorvastatin (LIPITOR) 10 MG tablet, TAKE 1 TABLET BY MOUTH DAILY, Disp: 90 tablet, Rfl: 3 .  azelastine (ASTELIN) 0.1 % nasal spray, USE 2 SPRAYS IN EACH NOSTRIL DAILY AS DIRECTED (Patient taking differently:  Place 2 sprays into both nostrils daily as needed for allergies.), Disp: 30 mL, Rfl: 5 .  azithromycin (ZITHROMAX) 250 MG tablet, 2 tabs poqday1, 1 tab poqday 2-5, Disp: 6 tablet, Rfl: 0 .  CALCIUM-MAGNESIUM PO, Take 1 tablet by mouth 2 (two) times daily. , Disp: , Rfl:  .  cefpodoxime (VANTIN) 200 MG tablet, Take 1 tablet (200 mg total) by mouth 2 (two) times daily., Disp: 20 tablet, Rfl: 0 .  cephALEXin (KEFLEX) 500 MG capsule, Take 500 mg by mouth 3 (three) times daily. 10 DS, Disp: , Rfl:  .  cetirizine (ZYRTEC) 10 MG tablet, Take 10 mg by mouth daily., Disp: , Rfl:  .  cholecalciferol (VITAMIN D) 25 MCG (1000 UT) tablet, Take 1,000 Units by mouth daily. , Disp: , Rfl:  .  cyproheptadine (PERIACTIN) 4 MG tablet, TAKE 2 TABLETS BY MOUTH EVERY EVENING (Patient taking differently: Take 8 mg by mouth every evening.), Disp: 180 tablet, Rfl: 2 .  denosumab (PROLIA) 60 MG/ML SOLN injection, Inject 60 mg into the skin every 6 (six) months. Administer in upper arm, thigh, or abdomen, Disp: , Rfl:  .  DEXILANT 60 MG capsule, TAKE 1 CAPSULE BY MOUTH EACH MORNING (Patient taking differently: Take 60 mg by mouth daily.), Disp: 90 capsule, Rfl: 3 .  diclofenac Sodium (VOLTAREN) 1 % GEL, APPLY 2 GRAMS TOPICALLY TWICE DAILY (Patient taking differently: Apply 1 application topically 4 (four) times daily as needed (joint pain).), Disp: 350 g, Rfl: 3 .  DULERA 200-5 MCG/ACT AERO, INHALE  2 PUFFS BY MOUTH TWICE DAILY (Patient taking differently: Inhale 2 puffs into the lungs 2 (two) times daily.), Disp: 39 g, Rfl: 1 .  EPINEPHrine (EPIPEN 2-PAK) 0.3 mg/0.3 mL IJ SOAJ injection, Inject 0.3 mLs (0.3 mg total) into the muscle Once PRN. (Patient taking differently: Inject 0.3 mg into the muscle as needed for anaphylaxis.), Disp: 0.3 mL, Rfl: 2 .  famotidine (PEPCID) 20 MG tablet, TAKE 1 TABLET BY MOUTH TWICE A DAY (Patient taking differently: Take 20 mg by mouth 2 (two) times daily.), Disp: 60 tablet, Rfl: 5 .  fluticasone  (FLONASE) 50 MCG/ACT nasal spray, Place 2 sprays into both nostrils daily., Disp: 16 g, Rfl: 2 .  folic acid (FOLVITE) 1 MG tablet, Take 1 mg by mouth daily., Disp: , Rfl:  .  gabapentin (NEURONTIN) 600 MG tablet, TAKE 1 TABLET BY MOUTH DAILY (Patient taking differently: Take 600 mg by mouth daily.), Disp: 90 tablet, Rfl: 3 .  guaiFENesin (MUCINEX) 600 MG 12 hr tablet, Take 600 mg by mouth 2 (two) times daily., Disp: , Rfl:  .  hydrocortisone 2.5 % cream, Apply topically 2 (two) times daily., Disp: 30 g, Rfl: 0 .  Lactase 9000 units TABS, Take 18,000-27,000 Units by mouth 4 (four) times daily as needed (dairy consumption)., Disp: , Rfl:  .  leflunomide (ARAVA) 20 MG tablet, Take 20 mg by mouth daily., Disp: , Rfl:  .  levofloxacin (LEVAQUIN) 500 MG tablet, Take 1 tablet (500 mg total) by mouth daily., Disp: 7 tablet, Rfl: 0 .  lidocaine (LIDODERM) 5 %, Place 1 patch onto the skin daily as needed (pain). Remove & Discard patch within 12 hours or as directed by MD, Disp: , Rfl:  .  mirtazapine (REMERON SOL-TAB) 30 MG disintegrating tablet, DISSOLVE 1 TABLET BY MOUTH EVERY NIGHT AT BEDTIME (Patient taking differently: Take 30 mg by mouth at bedtime.), Disp: 90 tablet, Rfl: 1 .  MITIGARE 0.6 MG CAPS, Take 0.6 mg by mouth daily as needed (inflammation & pain)., Disp: , Rfl:  .  montelukast (SINGULAIR) 10 MG tablet, TAKE 1 TABLET BY MOUTH DAILY (Patient taking differently: Take 10 mg by mouth every evening.), Disp: 30 tablet, Rfl: 5 .  mupirocin cream (BACTROBAN) 2 %, Apply 1 application topically 2 (two) times daily., Disp: 15 g, Rfl: 0 .  Nystatin (GERHARDT'S BUTT CREAM) CREA, Apply 1 application topically daily as needed (hemorrhoids). NYSTATIN 100,000 UNIT/GM CR 100000 CRM- 60 g, ZINC OXIDE 20 % OINT 20 OINT- 60 g, HYDROCORTISONE 1 % CREAM- 60 g, Disp: 1 each, Rfl: 11 .  nystatin (MYCOSTATIN) 100000 UNIT/ML suspension, SWISH AND SPIT 5 MLS TWICE DAILY, Disp: 473 mL, Rfl: 3 .  nystatin cream  (MYCOSTATIN), Apply 1 application topically 2 (two) times daily., Disp: , Rfl:  .  nystatin-triamcinolone ointment (MYCOLOG), Apply topically 2 (two) times daily. (Patient taking differently: Apply 1 application topically 2 (two) times daily as needed (yeast infections).), Disp: 90 g, Rfl: 1 .  ondansetron (ZOFRAN) 4 MG tablet, Take 1 tablet (4 mg total) by mouth every 8 (eight) hours as needed for nausea or vomiting., Disp: 10 tablet, Rfl: 0 .  ondansetron (ZOFRAN) 4 MG tablet, Take 1 tablet (4 mg total) by mouth every 8 (eight) hours as needed for nausea or vomiting., Disp: 20 tablet, Rfl: 0 .  predniSONE (DELTASONE) 5 MG tablet, Take 15 mg by mouth daily with breakfast., Disp: , Rfl:  .  Prenatal Vit-Fe Fumarate-FA (PRENATAL MULTIVITAMIN) TABS tablet, Take 1 tablet by mouth  daily., Disp: , Rfl:  .  Probiotic Product (ALIGN PO), Take 1 capsule by mouth daily., Disp: , Rfl:  .  Propylene Glycol (SYSTANE COMPLETE) 0.6 % SOLN, Place 1 drop into both eyes 2 (two) times daily., Disp: , Rfl:  .  Respiratory Therapy Supplies (FLUTTER) DEVI, Use as directed, Disp: 1 each, Rfl: 0 .  Salicylic Acid-Urea (KERASAL EX), Apply 1 application topically at bedtime., Disp: , Rfl:  .  sodium chloride HYPERTONIC 3 % nebulizer solution, Take by nebulization 2 (two) times daily. (Patient taking differently: Take 4 mLs by nebulization 2 (two) times daily.), Disp: 750 mL, Rfl: 12 .  Spacer/Aero-Holding Chambers (AEROCHAMBER PLUS) inhaler, Use as instructed, Disp: 1 each, Rfl: 0 .  Tiotropium Bromide Monohydrate (SPIRIVA RESPIMAT) 2.5 MCG/ACT AERS, Inhale 1 puff into the lungs daily., Disp: 4 g, Rfl: 11 .  traMADol (ULTRAM) 50 MG tablet, Take 1 tablet (50 mg total) by mouth every 6 (six) hours as needed., Disp: 30 tablet, Rfl: 0 .  XARELTO 20 MG TABS tablet, TAKE 1 TABLET BY MOUTH DAILY WITH SUPPER (Patient taking differently: Take 20 mg by mouth every evening.), Disp: 90 tablet, Rfl: 3 .  furosemide (LASIX) 20 MG tablet,  Take 1 tablet (20 mg total) by mouth every evening. Take 40 mg in the AM, and take additional 20 mg in the PM for swelling, Disp: 30 tablet, Rfl: 3 .  furosemide (LASIX) 40 MG tablet, Take 1 tablet (40 mg total) by mouth daily as needed., Disp: 30 tablet, Rfl: 3  Current Facility-Administered Medications:  .  denosumab (PROLIA) injection 60 mg, 60 mg, Subcutaneous, Q6 months, Ebony Frizzle, MD, 60 mg at 03/31/20 1224

## 2020-04-01 NOTE — Telephone Encounter (Signed)
Prescription re-sent to pharmacy.

## 2020-04-01 NOTE — Telephone Encounter (Signed)
Pt called in again today asking that her refill of  furosemide (LASIX) 20 MG tablet [[id number]] sent to [facility].  Cb#: [phone]

## 2020-04-01 NOTE — Patient Instructions (Addendum)
Continue Cefpodoxime until finished.   Continue nebulizer treatments and flutter valve therapy.   We will schedule you for bronchoscopy on 2/14 or 2/15.

## 2020-04-01 NOTE — H&P (View-Only) (Signed)
Synopsis: Referred in 2011 for asthma by Ebony Frizzle, MD.  Previously a patient of Drs. Ebony Scott, Ebony Scott and Ebony Scott.  Subjective:   PATIENT ID: Ebony Scott, Ebony Scott   HPI  Chief Complaint  Patient presents with  . Asthma    PCP order CT chest on 03/25/20, here to review this.    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   CT scan 03/25/2020 showed multifocal areas of nodularity and consolidative changes. Findings consistent with atypical infection or possible malignancy. She was started on azithromycin and cefpodoxime by Dr. Dennard Scott. She has completed the azithromycin and she started the cefpodoxime on 2/7. She was treated with levaquin on 03/21/20 for 7 days initially until the CT scan resulted. Blood cultures were negative on 02/23/20 when she presented with AMS.   She reports she is coughing up brown sputum. At baseline she doe snot cough up sputum or it is clear. The sputum amount has declined she taking antibiotics. She denies fevers, chills or sweats. Her appetite is well.    OV 09/01/19: Ebony Scott is a 70 y/o woman who presents for follow up of bronchiectasis, tracheobronchomalacia, immunosuppression, chronic AC for recurrent VTE. She started prescribed levofloxacin last evening for Pseudomonas bronchiectasis AE. So far she has not had nausea associated with taking this med, but took zofran prophylacticaly. She remains SOB with no change so far in her symptoms. She still coughs frequently despite using her inhaler and hypertonic saline nebs + flutter as prescribed. She has follow up with her PCP soon for hyperglycemiaa. She has follow up with her rheumatologist in August and is hoping that her prednisone will be able to  be decreased soon.  Past Medical History:  Diagnosis Date  . Acute deep vein thrombosis (DVT) of right lower extremity (Ebony Scott) 12/13/2017  . Allergic rhinitis   . Allergy    SEASONAL  . Anemia   . Anxiety    pt denies  . Arthritis    Phreesia 10/21/2019  . Asthma   . Asthma    Phreesia 10/21/2019  . Breast cancer ([place]) 1998   in remission  . Cataract    REMOVED  . Clostridium difficile colitis 01/14/2018  . Clostridium difficile diarrhea 01/14/2018  . Complication of anesthesia    "had hard time waking up from it several times" (02/20/2012)  . Depression    "some; don't take anything for it" (02/20/2012)  . Diverticulosis   . DVT (deep venous thrombosis) ([place])   . Exertional dyspnea   . Fibromyalgia 11/2011  . GERD (gastroesophageal reflux disease)   . Graves disease   . Headache(784.0)    "related to allergies; more at different times during the year" (02/20/2012)  . Hemorrhoids   . Hiatal hernia    back and neck  . Hx of adenomatous colonic polyps 04/12/2016  . Hypercholesteremia    good cholesterol is high  . Hypothyroidism   . IBS (irritable bowel syndrome)   . Moderate persistent asthma    -FeV1 72% 2011, -IgE 102 2011, CT sinus Neg 2011  . Osteoporosis    on reclast yearly  . Peripheral vascular disease ([place]) 2019   DVTs  . Pneumonia 04/2011; ~ 11/2011   "double; single" (02/20/2012)  . Seronegative rheumatoid arthritis (  [place])    Dr. Lahoma [facility]  . SIRS (systemic inflammatory response syndrome) ([place]) 02/10/2018  . Tracheobronchomalacia      Family History  Problem Relation Age of Onset  . Allergies Mother   . Heart disease Mother   . Arthritis Mother   . Lung cancer Mother   . Diabetes Mother   . Allergies Father   . Heart disease Father   . Arthritis Father   . Stroke Father   . Colon cancer Other        Maternal half aunt/Maternal half uncle  . Colitis Daughter   . Diabetes Maternal Grandfather      Social History   Socioeconomic History  .  Marital status: Divorced    Spouse name: Not on file  . Number of children: 2  . Years of education: college  . Highest education level: Not on file  Occupational History  . Occupation: Disabled    Comment: Retired Engineer, production: RETIRED  Tobacco Use  . Smoking status: Passive Smoke Exposure - Never Smoker  . Smokeless tobacco: Never Used  . Tobacco comment: Parents  Vaping Use  . Vaping Use: Never used  Substance and Sexual Activity  . Alcohol use: Not Currently    Alcohol/week: 0.0 standard drinks  . Drug use: No  . Sexual activity: Never  Other Topics Concern  . Not on file  Social History Narrative   Patient lives at home alone. Patient  divorced.    Patient has her BS degree.   Right handed.   Caffeine- sometimes coffee.      Ebony Scott:   Born in [place], [place]. She worked as a Copywriter, advertising. She has no pets currently. She does have indoor plants. Previously had mold in her home that was remediated. Carpet was removed.          Social Determinants of Health   Financial Resource Strain: Not on file  Food Insecurity: Not on file  Transportation Needs: Not on file  Physical Activity: Not on file  Stress: Not on file  Social Connections: Not on file  Intimate Partner Violence: Not on file     Allergies  Allergen Reactions  . Dust Mite Extract Shortness Of Breath and Other (See Comments)    "sneezing" (02/20/2012)  . Molds & Smuts Shortness Of Breath  . Morphine And Related Hives and Itching  . Other Shortness Of Breath and Other (See Comments)    Grass and weeds "sneezing; filled sinuses" (02/20/2012)  . Penicillins Rash and Other (See Comments)    "welts" (02/20/2012) Has patient had a PCN reaction causing immediate rash, facial/tongue/throat swelling, SOB or lightheadedness with hypotension: Unknown Has patient had a PCN reaction causing severe rash involving mucus membranes or skin necrosis: No Has patient had a PCN  reaction that required hospitalization: No Has patient had a PCN reaction occurring within the last 10 years: No If all of the above answers are "NO", then may proceed with Cephalosporin use. Tolerated Ancef 02/16/20.   Marland Kitchen Reclast [Zoledronic Acid] Other (See Comments)    Fever, Put in hospital, dr said it was a reaction from a reaction   . Rofecoxib Swelling    Vioxx REACTION: feet swelling  . Shellfish Allergy Anaphylaxis, Shortness Of Breath, Itching and Rash  . Tetracycline Hcl Nausea And Vomiting  . Xolair [Omalizumab] Other (See Comments)    Caused Blood clot  . Dilaudid [Hydromorphone Hcl] Itching  . Hydrocodone-Acetaminophen Nausea And Vomiting  .  Levofloxacin Other (See Comments)    GI upset  . Oxycodone Hcl Nausea And Vomiting  . Paroxetine Nausea And Vomiting    Paxil   . Baclofen Other (See Comments)    Altered mental status requiring 3 day hospital stay   . Celecoxib Swelling    Feet swelling  . Diltiazem Swelling  . Lactose Intolerance (Gi) Other (See Comments)    Bloating and gas  . Tree Extract Other (See Comments)    "tested and told I was allergic to it; never experienced a reaction to it" (02/20/2012)     Outpatient Medications Prior to Visit  Medication Sig Dispense Refill  . acetaminophen (TYLENOL) 650 MG CR tablet Take 1,300 mg by mouth every 8 (eight) hours as needed for pain.    Marland Kitchen albuterol (PROVENTIL) (2.5 MG/3ML) 0.083% nebulizer solution Take 3 mLs (2.5 mg total) by nebulization in the morning and at bedtime. Along with flutter valve 180 mL 5  . allopurinol (ZYLOPRIM) 100 MG tablet Take 100 mg by mouth daily.    . Alpha-D-Galactosidase (BEANO PO) Take 2-3 capsules by mouth 3 (three) times daily with meals.    . Alpha-Lipoic Acid 600 MG CAPS Take 600 mg by mouth daily.     Marland Kitchen ammonium lactate (AMLACTIN) 12 % cream Apply topically as needed for dry skin. (Patient taking differently: Apply 1 g topically at bedtime.) 385 g 2  . atorvastatin (LIPITOR) 10 MG  tablet TAKE 1 TABLET BY MOUTH DAILY 90 tablet 3  . azelastine (ASTELIN) 0.1 % nasal spray USE 2 SPRAYS IN EACH NOSTRIL DAILY AS DIRECTED (Patient taking differently: Place 2 sprays into both nostrils daily as needed for allergies.) 30 mL 5  . azithromycin (ZITHROMAX) 250 MG tablet 2 tabs poqday1, 1 tab poqday 2-5 6 tablet 0  . CALCIUM-MAGNESIUM PO Take 1 tablet by mouth 2 (two) times daily.     . cefpodoxime (VANTIN) 200 MG tablet Take 1 tablet (200 mg total) by mouth 2 (two) times daily. 20 tablet 0  . cephALEXin (KEFLEX) 500 MG capsule Take 500 mg by mouth 3 (three) times daily. 10 DS    . cetirizine (ZYRTEC) 10 MG tablet Take 10 mg by mouth daily.    . cholecalciferol (VITAMIN D) 25 MCG (1000 UT) tablet Take 1,000 Units by mouth daily.     . cyproheptadine (PERIACTIN) 4 MG tablet TAKE 2 TABLETS BY MOUTH EVERY EVENING (Patient taking differently: Take 8 mg by mouth every evening.) 180 tablet 2  . denosumab (PROLIA) 60 MG/ML SOLN injection Inject 60 mg into the skin every 6 (six) months. Administer in upper arm, thigh, or abdomen    . DEXILANT 60 MG capsule TAKE 1 CAPSULE BY MOUTH EACH MORNING (Patient taking differently: Take 60 mg by mouth daily.) 90 capsule 3  . diclofenac Sodium (VOLTAREN) 1 % GEL APPLY 2 GRAMS TOPICALLY TWICE DAILY (Patient taking differently: Apply 1 application topically 4 (four) times daily as needed (joint pain).) 350 g 3  . DULERA 200-5 MCG/ACT AERO INHALE 2 PUFFS BY MOUTH TWICE DAILY (Patient taking differently: Inhale 2 puffs into the lungs 2 (two) times daily.) 39 g 1  . EPINEPHrine (EPIPEN 2-PAK) 0.3 mg/0.3 mL IJ SOAJ injection Inject 0.3 mLs (0.3 mg total) into the muscle Once PRN. (Patient taking differently: Inject 0.3 mg into the muscle as needed for anaphylaxis.) 0.3 mL 2  . famotidine (PEPCID) 20 MG tablet TAKE 1 TABLET BY MOUTH TWICE A DAY (Patient taking differently: Take 20 mg by  mouth 2 (two) times daily.) 60 tablet 5  . fluticasone (FLONASE) 50 MCG/ACT nasal  spray Place 2 sprays into both nostrils daily. 16 g 2  . folic acid (FOLVITE) 1 MG tablet Take 1 mg by mouth daily.    Marland Kitchen gabapentin (NEURONTIN) 600 MG tablet TAKE 1 TABLET BY MOUTH DAILY (Patient taking differently: Take 600 mg by mouth daily.) 90 tablet 3  . guaiFENesin (MUCINEX) 600 MG 12 hr tablet Take 600 mg by mouth 2 (two) times daily.    . hydrocortisone 2.5 % cream Apply topically 2 (two) times daily. 30 g 0  . Lactase 9000 units TABS Take 18,000-27,000 Units by mouth 4 (four) times daily as needed (dairy consumption).    Marland Kitchen leflunomide (ARAVA) 20 MG tablet Take 20 mg by mouth daily.    Marland Kitchen levofloxacin (LEVAQUIN) 500 MG tablet Take 1 tablet (500 mg total) by mouth daily. 7 tablet 0  . lidocaine (LIDODERM) 5 % Place 1 patch onto the skin daily as needed (pain). Remove & Discard patch within 12 hours or as directed by MD    . mirtazapine (REMERON SOL-TAB) 30 MG disintegrating tablet DISSOLVE 1 TABLET BY MOUTH EVERY NIGHT AT BEDTIME (Patient taking differently: Take 30 mg by mouth at bedtime.) 90 tablet 1  . MITIGARE 0.6 MG CAPS Take 0.6 mg by mouth daily as needed (inflammation & pain).    . montelukast (SINGULAIR) 10 MG tablet TAKE 1 TABLET BY MOUTH DAILY (Patient taking differently: Take 10 mg by mouth every evening.) 30 tablet 5  . mupirocin cream (BACTROBAN) 2 % Apply 1 application topically 2 (two) times daily. 15 g 0  . Nystatin (GERHARDT'S BUTT CREAM) CREA Apply 1 application topically daily as needed (hemorrhoids). NYSTATIN 100,000 UNIT/GM CR 100000 CRM- 60 g, ZINC OXIDE 20 % OINT 20 OINT- 60 g, HYDROCORTISONE 1 % CREAM- 60 g 1 each 11  . nystatin (MYCOSTATIN) 100000 UNIT/ML suspension SWISH AND SPIT 5 MLS TWICE DAILY 473 mL 3  . nystatin cream (MYCOSTATIN) Apply 1 application topically 2 (two) times daily.    Marland Kitchen nystatin-triamcinolone ointment (MYCOLOG) Apply topically 2 (two) times daily. (Patient taking differently: Apply 1 application topically 2 (two) times daily as needed (yeast  infections).) 90 g 1  . ondansetron (ZOFRAN) 4 MG tablet Take 1 tablet (4 mg total) by mouth every 8 (eight) hours as needed for nausea or vomiting. 10 tablet 0  . ondansetron (ZOFRAN) 4 MG tablet Take 1 tablet (4 mg total) by mouth every 8 (eight) hours as needed for nausea or vomiting. 20 tablet 0  . predniSONE (DELTASONE) 5 MG tablet Take 15 mg by mouth daily with breakfast.    . Prenatal Vit-Fe Fumarate-FA (PRENATAL MULTIVITAMIN) TABS tablet Take 1 tablet by mouth daily.    . Probiotic Product (ALIGN PO) Take 1 capsule by mouth daily.    Marland Kitchen Propylene Glycol (SYSTANE COMPLETE) 0.6 % SOLN Place 1 drop into both eyes 2 (two) times daily.    Marland Kitchen Respiratory Therapy Supplies (FLUTTER) DEVI Use as directed 1 each 0  . Salicylic Acid-Urea (KERASAL EX) Apply 1 application topically at bedtime.    . sodium chloride HYPERTONIC 3 % nebulizer solution Take by nebulization 2 (two) times daily. (Patient taking differently: Take 4 mLs by nebulization 2 (two) times daily.) 750 mL 12  . Spacer/Aero-Holding Chambers (AEROCHAMBER PLUS) inhaler Use as instructed 1 each 0  . Tiotropium Bromide Monohydrate (SPIRIVA RESPIMAT) 2.5 MCG/ACT AERS Inhale 1 puff into the lungs daily. 4 g  11  . traMADol (ULTRAM) 50 MG tablet Take 1 tablet (50 mg total) by mouth every 6 (six) hours as needed. 30 tablet 0  . XARELTO 20 MG TABS tablet TAKE 1 TABLET BY MOUTH DAILY WITH SUPPER (Patient taking differently: Take 20 mg by mouth every evening.) 90 tablet 3  . furosemide (LASIX) 20 MG tablet Take 1 tablet (20 mg total) by mouth every evening. Take 40 mg in the AM, and take additional 20 mg in the PM for swelling 30 tablet 3  . furosemide (LASIX) 40 MG tablet TAKE 1 TABLET BY MOUTH DAILY AS NEEDED 30 tablet 3   Facility-Administered Medications Prior to Visit  Medication Dose Route Frequency Provider Last Rate Last Admin  . denosumab (PROLIA) injection 60 mg  60 mg Subcutaneous Q6 months Ebony Frizzle, MD   60 mg at 03/31/20 1224     Review of Systems  Constitutional: Negative for chills, fever, malaise/fatigue and weight loss.  HENT: Negative for congestion, sinus pain and sore throat.   Eyes: Negative.   Respiratory: Positive for cough, sputum production and wheezing. Negative for hemoptysis and shortness of breath.   Cardiovascular: Negative for chest pain, palpitations, orthopnea, claudication and leg swelling.  Gastrointestinal: Negative for abdominal pain, heartburn, nausea and vomiting.  Genitourinary: Negative.   Musculoskeletal: Negative for joint pain and myalgias.  Skin: Negative for rash.  Neurological: Negative for weakness.  Endo/Heme/Allergies: Negative.   Psychiatric/Behavioral: Negative.       Objective:   Vitals:   04/01/20 0953  BP: (!) 144/72  Pulse: (!) 107  Temp: (!) 97.1 F (36.2 C)  TempSrc: Temporal  SpO2: 94%  Weight: 136 lb 12.8 oz (62.1 kg)  Height: _0  (1.549 m)     Physical Exam Constitutional:      General: She is not in acute distress.    Appearance: She is not ill-appearing.  HENT:     Head: Normocephalic and atraumatic.  Eyes:     General: No scleral icterus.    Conjunctiva/sclera: Conjunctivae normal.     Pupils: Pupils are equal, round, and reactive to light.  Cardiovascular:     Rate and Rhythm: Normal rate and regular rhythm.     Pulses: Normal pulses.     Heart sounds: Normal heart sounds. No murmur heard.   Scott:     Effort: Scott effort is normal.     Breath sounds: Normal breath sounds. No wheezing, rhonchi or rales.  Abdominal:     General: Bowel sounds are normal.     Palpations: Abdomen is soft.  Musculoskeletal:     Right lower leg: No edema.     Left lower leg: No edema.  Lymphadenopathy:     Cervical: No cervical adenopathy.  Skin:    General: Skin is warm and dry.  Neurological:     General: No focal deficit present.     Mental Status: She is alert.  Psychiatric:        Mood and Affect: Mood normal.        Behavior:  Behavior normal.        Thought Content: Thought content normal.        Judgment: Judgment normal.    CBC    Component Value Date/Time   WBC 14.7 (H) 03/31/2020 1215   RBC 2.73 (L) 03/31/2020 1215   HGB 9.5 (L) 03/31/2020 1215   HCT 28.8 (L) 03/31/2020 1215   PLT 251 03/31/2020 1215   MCV 105.5 (H) 03/31/2020 1215  MCH 34.8 (H) 03/31/2020 1215   MCHC 33.0 03/31/2020 1215   RDW 15.2 (H) 03/31/2020 1215   LYMPHSABS 456 (L) 03/31/2020 1215   MONOABS 1.0 02/23/2020 2326   EOSABS 15 03/31/2020 1215   BASOSABS 44 03/31/2020 1215   BMP Latest Ref Rng & Units 03/31/2020 03/21/2020 02/28/2020  Glucose 65 - 99 mg/dL 118(H) 249(H) 95  BUN 7 - 25 mg/dL 31(H) 32(H) 14  Creatinine 0.60 - 0.93 mg/dL 1.19(H) 1.34(H) 1.12(H)  BUN/Creat Ratio 6 - 22 (calc) 26(H) 24(H) -  Sodium 135 - 146 mmol/L 140 141 143  Potassium 3.5 - 5.3 mmol/L 4.1 4.2 4.3  Chloride 98 - 110 mmol/L 99 100 114(H)  CO2 20 - 32 mmol/L 28 25 19(L)  Calcium 8.6 - 10.4 mg/dL 9.2 8.4(L) 8.5(L)   Chest imaging: Chest Imaging- films reviewed: CT chest wo contrast 03/25/2020 - multifocal areas of nodularity and consolidative changes. Findings consistent with atypical infection or possible malignancy.  CT high-resolution 10/24/2018-mild bronchiectasis right middle lobe, lingula, right lower lobe, but does taper more appropriately distally.  Persistent nodules in right middle lobe and lingula with mild tree-in-bud opacities.  No inter or intralobular septal thickening or significant groundglass.  Expiratory images with some mosaicism and significant bronchial and tracheal collapse, especially on the right.   HRCT chest 08/30/19: RUL TIB opacities, multilobar bronchiectasis. Tracheobronchomalacia progressed since previous CT scan. Subacute rib fractures- healing. CAD.  PFT: PFT Results Latest Ref Rng & Units 08/20/2019 09/18/2018 06/19/2017 08/08/2015 04/29/2015  FVC-Pre L 1.72 1.74 2.00 2.08 2.24  FVC-Predicted Pre % 66 66 74 75 81  FVC-Post  L 1.65 1.61 1.86 2.17 2.25  FVC-Predicted Post % 63 61 69 78 81  Pre FEV1/FVC % % 90 88 90 83 87  Post FEV1/FCV % % 90 91 93 87 84  FEV1-Pre L 1.55 1.53 1.80 1.73 1.95  FEV1-Predicted Pre % 79 76 88 82 92  FEV1-Post L 1.49 1.47 1.72 1.89 1.89  DLCO uncorrected ml/min/mmHg 11.33 13.51 13.68 12.36 12.99  DLCO UNC% % 64 77 66 60 63  DLCO corrected ml/min/mmHg 11.33 - - 13.76 12.87  DLCO COR %Predicted % 64 - - 66 62  DLVA Predicted % 83 92 85 94 81  TLC L 3.71 3.52 3.95 - 3.91  TLC % Predicted % 80 76 85 - 84  RV % Predicted % 76 79 86 - 83   2020-no significant obstruction, mild restriction, mild DLCO reduction.  Flow volume loops consistent with restriction.  2021-obstruction or bronchodilator reversibility.  No significant restriction.  Mild diffusion impairment.  Compared to 2020, mild improvement.  Previous respiratory cultures- all fungal and AFB negative from right middle lobe and lingula during bronchoscopy 08/2016, only positive for normal flora   Pathology 08/23/2016: BAL lingula-no AFB or fungus  Echocardiogram: Unable to review report from 02/2018.  08/05/2017 demonstrates LVEF 65 to 17%, grade 1 diastolic dysfunction.  Normal LA, RV, RA.  Trivial TR and MR, otherwise normal valves.  Echocardiogram 07/10/2019-LVEF greater than 75% with hyperdynamic function, moderate LVH with grade 1 diastolic dysfunction.  Normal LA, RV, RA.  Trivial AR, otherwise normal valves.  Allergy panel 04/02/2016-increased IgE for Aspergillus, otherwise negative Alpha-1 antitrypsin 170  Bronchoscopy 12/24/2018 (performed at [facility]) Neutrophils 12% Lymphocytes 1% Macrophages 79% Respiratory viral panel negative Unable to see PJP DNA Respiratory culture-1+ mixed gram-negative rods, normal flora Fungus culture negative AFB negative  08/25/19 sputum- Pan-sensitive Pseudomonas  Assessment & Plan:   Tracheobronchomalacia  Bronchiectasis without complication (  [place])  Immunocompromised  ([place])  Pneumonia due to infectious organism, unspecified laterality, unspecified part of lung  Discussion: Hinley Brimage is a 70 year old woman, never smoker with asthma, tracheobronchomalacia on CPAP with oxygen, bronchiectasis and rheumatoid arthritis on leflunamide who returns to [facility] for evaluation of pneumonia and abnormal CT chest scan after her recent surgery.   She has multiple areas bilateral of new consolidative lesions concerning for an infectious etiology based on the timeline of these new findings and given her immunosuppressed state. There is less concern for a malignancy.  She has been scheduled for bronchoscopy and BAL on 04/06/19. We will send BAL sample for cultures and cytology.   She is to complete her current antibiotic regimen of cefpodoxime. She has already completed azithromycin.   Follow up in 4 weeks.  Freda Jackson, MD [facility] Office: [phone]   Current Outpatient Medications:  .  acetaminophen (TYLENOL) 650 MG CR tablet, Take 1,300 mg by mouth every 8 (eight) hours as needed for pain., Disp: , Rfl:  .  albuterol (PROVENTIL) (2.5 MG/3ML) 0.083% nebulizer solution, Take 3 mLs (2.5 mg total) by nebulization in the morning and at bedtime. Along with flutter valve, Disp: 180 mL, Rfl: 5 .  allopurinol (ZYLOPRIM) 100 MG tablet, Take 100 mg by mouth daily., Disp: , Rfl:  .  Alpha-D-Galactosidase (BEANO PO), Take 2-3 capsules by mouth 3 (three) times daily with meals., Disp: , Rfl:  .  Alpha-Lipoic Acid 600 MG CAPS, Take 600 mg by mouth daily. , Disp: , Rfl:  .  ammonium lactate (AMLACTIN) 12 % cream, Apply topically as needed for dry skin. (Patient taking differently: Apply 1 g topically at bedtime.), Disp: 385 g, Rfl: 2 .  atorvastatin (LIPITOR) 10 MG tablet, TAKE 1 TABLET BY MOUTH DAILY, Disp: 90 tablet, Rfl: 3 .  azelastine (ASTELIN) 0.1 % nasal spray, USE 2 SPRAYS IN EACH NOSTRIL DAILY AS DIRECTED (Patient taking differently:  Place 2 sprays into both nostrils daily as needed for allergies.), Disp: 30 mL, Rfl: 5 .  azithromycin (ZITHROMAX) 250 MG tablet, 2 tabs poqday1, 1 tab poqday 2-5, Disp: 6 tablet, Rfl: 0 .  CALCIUM-MAGNESIUM PO, Take 1 tablet by mouth 2 (two) times daily. , Disp: , Rfl:  .  cefpodoxime (VANTIN) 200 MG tablet, Take 1 tablet (200 mg total) by mouth 2 (two) times daily., Disp: 20 tablet, Rfl: 0 .  cephALEXin (KEFLEX) 500 MG capsule, Take 500 mg by mouth 3 (three) times daily. 10 DS, Disp: , Rfl:  .  cetirizine (ZYRTEC) 10 MG tablet, Take 10 mg by mouth daily., Disp: , Rfl:  .  cholecalciferol (VITAMIN D) 25 MCG (1000 UT) tablet, Take 1,000 Units by mouth daily. , Disp: , Rfl:  .  cyproheptadine (PERIACTIN) 4 MG tablet, TAKE 2 TABLETS BY MOUTH EVERY EVENING (Patient taking differently: Take 8 mg by mouth every evening.), Disp: 180 tablet, Rfl: 2 .  denosumab (PROLIA) 60 MG/ML SOLN injection, Inject 60 mg into the skin every 6 (six) months. Administer in upper arm, thigh, or abdomen, Disp: , Rfl:  .  DEXILANT 60 MG capsule, TAKE 1 CAPSULE BY MOUTH EACH MORNING (Patient taking differently: Take 60 mg by mouth daily.), Disp: 90 capsule, Rfl: 3 .  diclofenac Sodium (VOLTAREN) 1 % GEL, APPLY 2 GRAMS TOPICALLY TWICE DAILY (Patient taking differently: Apply 1 application topically 4 (four) times daily as needed (joint pain).), Disp: 350 g, Rfl: 3 .  DULERA 200-5 MCG/ACT AERO, INHALE  2 PUFFS BY MOUTH TWICE DAILY (Patient taking differently: Inhale 2 puffs into the lungs 2 (two) times daily.), Disp: 39 g, Rfl: 1 .  EPINEPHrine (EPIPEN 2-PAK) 0.3 mg/0.3 mL IJ SOAJ injection, Inject 0.3 mLs (0.3 mg total) into the muscle Once PRN. (Patient taking differently: Inject 0.3 mg into the muscle as needed for anaphylaxis.), Disp: 0.3 mL, Rfl: 2 .  famotidine (PEPCID) 20 MG tablet, TAKE 1 TABLET BY MOUTH TWICE A DAY (Patient taking differently: Take 20 mg by mouth 2 (two) times daily.), Disp: 60 tablet, Rfl: 5 .  fluticasone  (FLONASE) 50 MCG/ACT nasal spray, Place 2 sprays into both nostrils daily., Disp: 16 g, Rfl: 2 .  folic acid (FOLVITE) 1 MG tablet, Take 1 mg by mouth daily., Disp: , Rfl:  .  gabapentin (NEURONTIN) 600 MG tablet, TAKE 1 TABLET BY MOUTH DAILY (Patient taking differently: Take 600 mg by mouth daily.), Disp: 90 tablet, Rfl: 3 .  guaiFENesin (MUCINEX) 600 MG 12 hr tablet, Take 600 mg by mouth 2 (two) times daily., Disp: , Rfl:  .  hydrocortisone 2.5 % cream, Apply topically 2 (two) times daily., Disp: 30 g, Rfl: 0 .  Lactase 9000 units TABS, Take 18,000-27,000 Units by mouth 4 (four) times daily as needed (dairy consumption)., Disp: , Rfl:  .  leflunomide (ARAVA) 20 MG tablet, Take 20 mg by mouth daily., Disp: , Rfl:  .  levofloxacin (LEVAQUIN) 500 MG tablet, Take 1 tablet (500 mg total) by mouth daily., Disp: 7 tablet, Rfl: 0 .  lidocaine (LIDODERM) 5 %, Place 1 patch onto the skin daily as needed (pain). Remove & Discard patch within 12 hours or as directed by MD, Disp: , Rfl:  .  mirtazapine (REMERON SOL-TAB) 30 MG disintegrating tablet, DISSOLVE 1 TABLET BY MOUTH EVERY NIGHT AT BEDTIME (Patient taking differently: Take 30 mg by mouth at bedtime.), Disp: 90 tablet, Rfl: 1 .  MITIGARE 0.6 MG CAPS, Take 0.6 mg by mouth daily as needed (inflammation & pain)., Disp: , Rfl:  .  montelukast (SINGULAIR) 10 MG tablet, TAKE 1 TABLET BY MOUTH DAILY (Patient taking differently: Take 10 mg by mouth every evening.), Disp: 30 tablet, Rfl: 5 .  mupirocin cream (BACTROBAN) 2 %, Apply 1 application topically 2 (two) times daily., Disp: 15 g, Rfl: 0 .  Nystatin (GERHARDT'S BUTT CREAM) CREA, Apply 1 application topically daily as needed (hemorrhoids). NYSTATIN 100,000 UNIT/GM CR 100000 CRM- 60 g, ZINC OXIDE 20 % OINT 20 OINT- 60 g, HYDROCORTISONE 1 % CREAM- 60 g, Disp: 1 each, Rfl: 11 .  nystatin (MYCOSTATIN) 100000 UNIT/ML suspension, SWISH AND SPIT 5 MLS TWICE DAILY, Disp: 473 mL, Rfl: 3 .  nystatin cream  (MYCOSTATIN), Apply 1 application topically 2 (two) times daily., Disp: , Rfl:  .  nystatin-triamcinolone ointment (MYCOLOG), Apply topically 2 (two) times daily. (Patient taking differently: Apply 1 application topically 2 (two) times daily as needed (yeast infections).), Disp: 90 g, Rfl: 1 .  ondansetron (ZOFRAN) 4 MG tablet, Take 1 tablet (4 mg total) by mouth every 8 (eight) hours as needed for nausea or vomiting., Disp: 10 tablet, Rfl: 0 .  ondansetron (ZOFRAN) 4 MG tablet, Take 1 tablet (4 mg total) by mouth every 8 (eight) hours as needed for nausea or vomiting., Disp: 20 tablet, Rfl: 0 .  predniSONE (DELTASONE) 5 MG tablet, Take 15 mg by mouth daily with breakfast., Disp: , Rfl:  .  Prenatal Vit-Fe Fumarate-FA (PRENATAL MULTIVITAMIN) TABS tablet, Take 1 tablet by mouth  daily., Disp: , Rfl:  .  Probiotic Product (ALIGN PO), Take 1 capsule by mouth daily., Disp: , Rfl:  .  Propylene Glycol (SYSTANE COMPLETE) 0.6 % SOLN, Place 1 drop into both eyes 2 (two) times daily., Disp: , Rfl:  .  Respiratory Therapy Supplies (FLUTTER) DEVI, Use as directed, Disp: 1 each, Rfl: 0 .  Salicylic Acid-Urea (KERASAL EX), Apply 1 application topically at bedtime., Disp: , Rfl:  .  sodium chloride HYPERTONIC 3 % nebulizer solution, Take by nebulization 2 (two) times daily. (Patient taking differently: Take 4 mLs by nebulization 2 (two) times daily.), Disp: 750 mL, Rfl: 12 .  Spacer/Aero-Holding Chambers (AEROCHAMBER PLUS) inhaler, Use as instructed, Disp: 1 each, Rfl: 0 .  Tiotropium Bromide Monohydrate (SPIRIVA RESPIMAT) 2.5 MCG/ACT AERS, Inhale 1 puff into the lungs daily., Disp: 4 g, Rfl: 11 .  traMADol (ULTRAM) 50 MG tablet, Take 1 tablet (50 mg total) by mouth every 6 (six) hours as needed., Disp: 30 tablet, Rfl: 0 .  XARELTO 20 MG TABS tablet, TAKE 1 TABLET BY MOUTH DAILY WITH SUPPER (Patient taking differently: Take 20 mg by mouth every evening.), Disp: 90 tablet, Rfl: 3 .  furosemide (LASIX) 20 MG tablet,  Take 1 tablet (20 mg total) by mouth every evening. Take 40 mg in the AM, and take additional 20 mg in the PM for swelling, Disp: 30 tablet, Rfl: 3 .  furosemide (LASIX) 40 MG tablet, Take 1 tablet (40 mg total) by mouth daily as needed., Disp: 30 tablet, Rfl: 3  Current Facility-Administered Medications:  .  denosumab (PROLIA) injection 60 mg, 60 mg, Subcutaneous, Q6 months, Ebony Frizzle, MD, 60 mg at 03/31/20 1224

## 2020-04-01 NOTE — Telephone Encounter (Signed)
Please schedule the following:   Diagnosis: cough, lung nodules Which side if for nodule / mass? bilateral Procedure: Flexible Bronchoscopy with BAL Has patient been spoken to by Provider and given informed consent? Yes Anesthesia: Yes Do you need Fluro? No Time needed for procedure: 1 hour Date: 04/04/20 Alternate Date: 2/15 Time: 12:30 or 1pm Location: [facility] Does patient have OSA? No DM? No Or Latex allergy? No Medication Restriction: None Anticoagulate/Antiplatelet: On Xarelto, instructed to hold 2 days prior to procedure Pre-op Labs Ordered: CBC, CMP, PT/INR, PTT  Please coordinate Pre-op COVID Testing   Saw patient in clinic today and she will await further scheduling instructions.  Thanks, Wille Glaser

## 2020-04-01 NOTE — Telephone Encounter (Signed)
I sent in 20 mg poqpm in addition to 40 mg poqam.

## 2020-04-01 NOTE — Telephone Encounter (Signed)
Called and spoke with patient. Let them know their Bronch is scheduled for 04/05/20 with Dr. Erin Fulling at [facility] at 12:30p.  Patient was instructed to arrive at hospital at 11am. They were instructed to bring someone with them as they will not be able to drive home from procedure. Patient instructed not to have anything to eat or drink after midnight. Patient needs to hold their blood thinner Xarelto 2 days prior to procedure.   Patient's covid screening is scheduled at 04/02/20 for [facility] location at [employer] .  Patient voiced understanding, nothing further needed  Routing to [place] as [employer]

## 2020-04-02 ENCOUNTER — Other Ambulatory Visit (HOSPITAL_COMMUNITY)
Admission: RE | Admit: 2020-04-02 | Discharge: 2020-04-02 | Disposition: A | Discharge status: Home / Self Care | Payer: Medicare PPO | Source: Ambulatory Visit | Attending: Pulmonary Disease | Admitting: Pulmonary Disease

## 2020-04-02 DIAGNOSIS — Z20822 Contact with and (suspected) exposure to covid-19: Secondary | ICD-10-CM | POA: Diagnosis not present

## 2020-04-02 DIAGNOSIS — Z01812 Encounter for preprocedural laboratory examination: Secondary | ICD-10-CM | POA: Insufficient documentation

## 2020-04-02 LAB — SARS CORONAVIRUS 2 (TAT 6-24 HRS): SARS Coronavirus 2: NEGATIVE

## 2020-04-04 ENCOUNTER — Other Ambulatory Visit: Payer: Self-pay

## 2020-04-05 ENCOUNTER — Ambulatory Visit (HOSPITAL_COMMUNITY): Payer: Medicare PPO

## 2020-04-05 ENCOUNTER — Encounter (HOSPITAL_COMMUNITY): Payer: Self-pay | Admitting: Pulmonary Disease

## 2020-04-05 ENCOUNTER — Encounter (HOSPITAL_COMMUNITY)
Admission: RE | Disposition: A | Discharge status: Home / Self Care | Payer: Self-pay | Source: Home / Self Care | Attending: Pulmonary Disease

## 2020-04-05 ENCOUNTER — Ambulatory Visit (HOSPITAL_COMMUNITY)
Admission: RE | Admit: 2020-04-05 | Discharge: 2020-04-05 | Disposition: A | Discharge status: Home / Self Care | Payer: Medicare PPO | Attending: Pulmonary Disease | Admitting: Pulmonary Disease

## 2020-04-05 ENCOUNTER — Ambulatory Visit (HOSPITAL_COMMUNITY): Payer: Medicare PPO | Admitting: Certified Registered Nurse Anesthetist

## 2020-04-05 DIAGNOSIS — Z86718 Personal history of other venous thrombosis and embolism: Secondary | ICD-10-CM | POA: Diagnosis not present

## 2020-04-05 DIAGNOSIS — J4540 Moderate persistent asthma, uncomplicated: Secondary | ICD-10-CM | POA: Insufficient documentation

## 2020-04-05 DIAGNOSIS — J9811 Atelectasis: Secondary | ICD-10-CM | POA: Diagnosis not present

## 2020-04-05 DIAGNOSIS — M797 Fibromyalgia: Secondary | ICD-10-CM | POA: Diagnosis not present

## 2020-04-05 DIAGNOSIS — Z7722 Contact with and (suspected) exposure to environmental tobacco smoke (acute) (chronic): Secondary | ICD-10-CM | POA: Insufficient documentation

## 2020-04-05 DIAGNOSIS — Z8701 Personal history of pneumonia (recurrent): Secondary | ICD-10-CM | POA: Insufficient documentation

## 2020-04-05 DIAGNOSIS — R0602 Shortness of breath: Secondary | ICD-10-CM

## 2020-04-05 DIAGNOSIS — Z880 Allergy status to penicillin: Secondary | ICD-10-CM | POA: Insufficient documentation

## 2020-04-05 DIAGNOSIS — J479 Bronchiectasis, uncomplicated: Secondary | ICD-10-CM | POA: Diagnosis not present

## 2020-04-05 DIAGNOSIS — Z881 Allergy status to other antibiotic agents status: Secondary | ICD-10-CM | POA: Insufficient documentation

## 2020-04-05 DIAGNOSIS — Z86010 Personal history of colonic polyps: Secondary | ICD-10-CM | POA: Insufficient documentation

## 2020-04-05 DIAGNOSIS — E785 Hyperlipidemia, unspecified: Secondary | ICD-10-CM | POA: Diagnosis not present

## 2020-04-05 DIAGNOSIS — Z800 Family history of malignant neoplasm of digestive organs: Secondary | ICD-10-CM | POA: Insufficient documentation

## 2020-04-05 DIAGNOSIS — R911 Solitary pulmonary nodule: Secondary | ICD-10-CM | POA: Diagnosis not present

## 2020-04-05 DIAGNOSIS — Z7901 Long term (current) use of anticoagulants: Secondary | ICD-10-CM | POA: Diagnosis not present

## 2020-04-05 DIAGNOSIS — Z79899 Other long term (current) drug therapy: Secondary | ICD-10-CM | POA: Insufficient documentation

## 2020-04-05 DIAGNOSIS — I739 Peripheral vascular disease, unspecified: Secondary | ICD-10-CM | POA: Insufficient documentation

## 2020-04-05 DIAGNOSIS — R918 Other nonspecific abnormal finding of lung field: Secondary | ICD-10-CM | POA: Diagnosis not present

## 2020-04-05 DIAGNOSIS — Z885 Allergy status to narcotic agent status: Secondary | ICD-10-CM | POA: Diagnosis not present

## 2020-04-05 DIAGNOSIS — Z801 Family history of malignant neoplasm of trachea, bronchus and lung: Secondary | ICD-10-CM | POA: Insufficient documentation

## 2020-04-05 DIAGNOSIS — Z888 Allergy status to other drugs, medicaments and biological substances status: Secondary | ICD-10-CM | POA: Diagnosis not present

## 2020-04-05 DIAGNOSIS — E876 Hypokalemia: Secondary | ICD-10-CM | POA: Diagnosis not present

## 2020-04-05 DIAGNOSIS — Z853 Personal history of malignant neoplasm of breast: Secondary | ICD-10-CM | POA: Diagnosis not present

## 2020-04-05 HISTORY — PX: BRONCHIAL WASHINGS: SHX5105

## 2020-04-05 HISTORY — PX: VIDEO BRONCHOSCOPY: SHX5072

## 2020-04-05 SURGERY — VIDEO BRONCHOSCOPY WITHOUT FLUORO
Anesthesia: General

## 2020-04-05 MED ORDER — VITAMIN D3 25 MCG (1000 UNIT) PO TABS: 1000.0000 [IU] | ORAL_TABLET | Freq: Every day | ORAL | Status: DC

## 2020-04-05 MED ORDER — CYPROHEPTADINE HCL 4 MG PO TABS: 8.0000 mg | ORAL_TABLET | Freq: Every evening | ORAL | Status: DC

## 2020-04-05 MED ORDER — COLCHICINE 0.6 MG PO CAPS: 0.6000 mg | ORAL_CAPSULE | ORAL | Status: DC

## 2020-04-05 MED ORDER — GUAIFENESIN ER 600 MG PO TB12: 600.0000 mg | ORAL_TABLET | Freq: Two times a day (BID) | ORAL | Status: DC

## 2020-04-05 MED ORDER — LIDOCAINE 5 % EX PTCH: 1.0000 | MEDICATED_PATCH | Freq: Every day | CUTANEOUS | Status: DC | PRN

## 2020-04-05 MED ORDER — ONDANSETRON HCL 4 MG PO TABS: 4.0000 mg | ORAL_TABLET | Freq: Three times a day (TID) | ORAL | Status: DC | PRN

## 2020-04-05 MED ORDER — PROPYLENE GLYCOL 0.6 % OP SOLN: 1.0000 [drp] | Freq: Two times a day (BID) | OPHTHALMIC | Status: DC | PRN

## 2020-04-05 MED ORDER — FUROSEMIDE 20 MG PO TABS: 20.0000 mg | ORAL_TABLET | Freq: Two times a day (BID) | ORAL | Status: DC

## 2020-04-05 MED ORDER — TIOTROPIUM BROMIDE MONOHYDRATE 2.5 MCG/ACT IN AERS: 1.0000 | INHALATION_SPRAY | Freq: Every day | RESPIRATORY_TRACT | Status: DC

## 2020-04-05 MED ORDER — ACETAMINOPHEN ER 650 MG PO TBCR: 1300.0000 mg | EXTENDED_RELEASE_TABLET | Freq: Three times a day (TID) | ORAL | Status: DC | PRN

## 2020-04-05 MED ORDER — PROPOFOL 10 MG/ML IV BOLUS
INTRAVENOUS | Status: DC | PRN
  Administered 2020-04-05: 120 mg via INTRAVENOUS

## 2020-04-05 MED ORDER — PREDNISONE 5 MG PO TABS: 15.0000 mg | ORAL_TABLET | Freq: Every day | ORAL | Status: DC

## 2020-04-05 MED ORDER — ATORVASTATIN CALCIUM 10 MG PO TABS: 10.0000 mg | ORAL_TABLET | Freq: Every day | ORAL | Status: DC

## 2020-04-05 MED ORDER — ALPHA-LIPOIC ACID 600 MG PO CAPS: 600.0000 mg | ORAL_CAPSULE | Freq: Every day | ORAL | Status: DC

## 2020-04-05 MED ORDER — FOLIC ACID 1 MG PO TABS: 1.0000 mg | ORAL_TABLET | Freq: Every day | ORAL | Status: DC

## 2020-04-05 MED ORDER — FAMOTIDINE 20 MG PO TABS: 20.0000 mg | ORAL_TABLET | Freq: Two times a day (BID) | ORAL | Status: DC

## 2020-04-05 MED ORDER — MIRTAZAPINE 30 MG PO TBDP: 30.0000 mg | ORAL_TABLET | Freq: Every day | ORAL | Status: DC

## 2020-04-05 MED ORDER — FENTANYL CITRATE (PF) 100 MCG/2ML IJ SOLN
INTRAMUSCULAR | Status: DC | PRN
  Administered 2020-04-05: 50 ug via INTRAVENOUS

## 2020-04-05 MED ORDER — FENTANYL CITRATE (PF) 100 MCG/2ML IJ SOLN
INTRAMUSCULAR | Status: AC
  Filled 2020-04-05: qty 2

## 2020-04-05 MED ORDER — NYSTATIN 100000 UNIT/ML MT SUSP: 5.0000 mL | Freq: Four times a day (QID) | OROMUCOSAL | Status: DC

## 2020-04-05 MED ORDER — MAGNESIUM OXIDE 400 MG PO TABS: 400.0000 mg | ORAL_TABLET | Freq: Every day | ORAL | Status: DC

## 2020-04-05 MED ORDER — AZELASTINE HCL 0.1 % NA SOLN: 2.0000 | Freq: Every day | NASAL | Status: DC | PRN

## 2020-04-05 MED ORDER — PRENATAL MULTIVITAMIN CH: 1.0000 | ORAL_TABLET | Freq: Every day | ORAL | Status: DC

## 2020-04-05 MED ORDER — LEFLUNOMIDE 20 MG PO TABS: 20.0000 mg | ORAL_TABLET | Freq: Every day | ORAL | Status: DC

## 2020-04-05 MED ORDER — FLUTTER DEVI: 1.0000 [IU] | Freq: Two times a day (BID) | Status: DC

## 2020-04-05 MED ORDER — PANTOPRAZOLE SODIUM 40 MG PO TBEC: 40.0000 mg | DELAYED_RELEASE_TABLET | Freq: Every day | ORAL | Status: DC

## 2020-04-05 MED ORDER — MONTELUKAST SODIUM 10 MG PO TABS: 10.0000 mg | ORAL_TABLET | Freq: Every evening | ORAL | Status: DC

## 2020-04-05 MED ORDER — PROPOFOL 10 MG/ML IV BOLUS
INTRAVENOUS | Status: AC
  Filled 2020-04-05: qty 20

## 2020-04-05 MED ORDER — GABAPENTIN 600 MG PO TABS: 600.0000 mg | ORAL_TABLET | Freq: Every day | ORAL | Status: DC

## 2020-04-05 MED ORDER — DENOSUMAB 60 MG/ML ~~LOC~~ SOLN: 60.0000 mg | SUBCUTANEOUS | Status: DC

## 2020-04-05 MED ORDER — ROCURONIUM BROMIDE 10 MG/ML (PF) SYRINGE
PREFILLED_SYRINGE | INTRAVENOUS | Status: DC | PRN
  Administered 2020-04-05: 50 mg via INTRAVENOUS

## 2020-04-05 MED ORDER — NYSTATIN-TRIAMCINOLONE 100000-0.1 UNIT/GM-% EX OINT: 1.0000 "application " | TOPICAL_OINTMENT | Freq: Two times a day (BID) | CUTANEOUS | Status: DC | PRN

## 2020-04-05 MED ORDER — SUGAMMADEX SODIUM 200 MG/2ML IV SOLN
INTRAVENOUS | Status: DC | PRN
  Administered 2020-04-05: 300 mg via INTRAVENOUS

## 2020-04-05 MED ORDER — FUROSEMIDE 40 MG PO TABS: 40.0000 mg | ORAL_TABLET | Freq: Every day | ORAL | Status: DC | PRN

## 2020-04-05 MED ORDER — LACTATED RINGERS IV SOLN: INTRAVENOUS | Status: DC | PRN

## 2020-04-05 MED ORDER — SALICYLIC ACID-UREA 5-10 % EX OINT: TOPICAL_OINTMENT | Freq: Every day | CUTANEOUS | Status: DC

## 2020-04-05 MED ORDER — FLUTICASONE PROPIONATE 50 MCG/ACT NA SUSP: 2.0000 | Freq: Every day | NASAL | Status: DC

## 2020-04-05 MED ORDER — HYDROCORTISONE 2.5 % EX CREA: TOPICAL_CREAM | Freq: Two times a day (BID) | CUTANEOUS | Status: DC | PRN

## 2020-04-05 MED ORDER — ONDANSETRON HCL 4 MG/2ML IJ SOLN
INTRAMUSCULAR | Status: DC | PRN
  Administered 2020-04-05: 4 mg via INTRAVENOUS

## 2020-04-05 MED ORDER — AMMONIUM LACTATE 12 % EX CREA: 1.0000 g | TOPICAL_CREAM | Freq: Every day | CUTANEOUS | Status: DC

## 2020-04-05 MED ORDER — MOMETASONE FURO-FORMOTEROL FUM 200-5 MCG/ACT IN AERO: 2.0000 | INHALATION_SPRAY | Freq: Two times a day (BID) | RESPIRATORY_TRACT | Status: DC

## 2020-04-05 MED ORDER — AEROCHAMBER PLUS MISC: 1.0000 | Freq: Once | Status: DC

## 2020-04-05 MED ORDER — ALLOPURINOL 100 MG PO TABS: 100.0000 mg | ORAL_TABLET | Freq: Every day | ORAL | Status: DC

## 2020-04-05 MED ORDER — ALIGN 4 MG PO CAPS: ORAL_CAPSULE | Freq: Every day | ORAL | Status: DC

## 2020-04-05 MED ORDER — FENTANYL CITRATE (PF) 100 MCG/2ML IJ SOLN: 25.0000 ug | INTRAMUSCULAR | Status: DC | PRN

## 2020-04-05 MED ORDER — LIDOCAINE 2% (20 MG/ML) 5 ML SYRINGE
INTRAMUSCULAR | Status: DC | PRN
  Administered 2020-04-05: 60 mg via INTRAVENOUS

## 2020-04-05 MED ORDER — ALBUTEROL SULFATE (2.5 MG/3ML) 0.083% IN NEBU: 2.5000 mg | INHALATION_SOLUTION | Freq: Four times a day (QID) | RESPIRATORY_TRACT | Status: DC

## 2020-04-05 MED ORDER — LORATADINE 10 MG PO TABS: 10.0000 mg | ORAL_TABLET | Freq: Every day | ORAL | Status: DC

## 2020-04-05 MED ORDER — LACTASE 9000 UNITS PO TABS
18000.0000 [IU] | ORAL_TABLET | Freq: Four times a day (QID) | ORAL | Status: DC | PRN
Start: 2020-04-05 — End: 2020-04-05

## 2020-04-05 MED ORDER — BEANO PO TABS: ORAL_TABLET | Freq: Three times a day (TID) | ORAL | Status: DC

## 2020-04-05 MED ORDER — ONDANSETRON HCL 4 MG/2ML IJ SOLN: 4.0000 mg | Freq: Once | INTRAMUSCULAR | Status: DC | PRN

## 2020-04-05 MED ORDER — GERHARDT'S BUTT CREAM: 1.0000 "application " | TOPICAL_CREAM | Freq: Every day | CUTANEOUS | Status: DC | PRN

## 2020-04-05 MED ORDER — DICLOFENAC SODIUM 1 % EX GEL: 2.0000 g | Freq: Four times a day (QID) | CUTANEOUS | Status: DC | PRN

## 2020-04-05 MED ORDER — RIVAROXABAN 20 MG PO TABS: 20.0000 mg | ORAL_TABLET | Freq: Every evening | ORAL | Status: DC

## 2020-04-05 MED ORDER — EPINEPHRINE 0.3 MG/0.3ML IJ SOAJ: 0.3000 mg | INTRAMUSCULAR | Status: DC | PRN

## 2020-04-05 MED ORDER — MUPIROCIN CALCIUM 2 % EX CREA: 1.0000 "application " | TOPICAL_CREAM | Freq: Two times a day (BID) | CUTANEOUS | Status: DC

## 2020-04-05 NOTE — Discharge Instructions (Signed)
Flexible Bronchoscopy  Flexible bronchoscopy is a procedure used to examine the passageways in the lungs. During the procedure, a thin, flexible tool with a camera (bronchoscope) is passed into the mouth or nose, down through the windpipe (trachea), and into the air tubes in the lungs (bronchi). This tool allows the health care provider to look inside the lungs and to take samples for testing, if needed. Tell a health care provider about:  Any allergies you have.  All medicines you are taking, including vitamins, herbs, eye drops, creams, and over-the-counter medicines.  Any problems you or family members have had with anesthetic medicines.  Any blood disorders you have.  Any surgeries you have had.  Any medical conditions you have.  Whether you are pregnant or may be pregnant. What are the risks? Generally, this is a safe procedure. However, problems may occur, including:  Infection.  Bleeding.  Damage to other structures or organs.  Allergic reactions to medicines.  Collapsed lung (pneumothorax).  Increased need for oxygen or difficulty breathing after the procedure. What happens before the procedure? Staying hydrated Follow instructions from your health care provider about hydration, which may include:  Up to 2 hours before the procedure - you may continue to drink clear liquids, such as water, clear fruit juice, black coffee, and plain tea.   Eating and drinking restrictions Follow instructions from your health care provider about eating and drinking, which may include:  8 hours before the procedure - stop eating heavy meals or foods, such as meat, fried foods, or fatty foods.  6 hours before the procedure - stop eating light meals or foods, such as toast or cereal.  6 hours before the procedure - stop drinking milk or drinks that contain milk.  2 hours before the procedure - stop drinking clear liquids. Medicines Ask your health care provider about:  Changing or  stopping your regular medicines. This is especially important if you are taking diabetes medicines or blood thinners.  Taking medicines such as aspirin and ibuprofen. These medicines can thin your blood. Do not take these medicines unless your health care provider tells you to take them.  Taking over-the-counter medicines, vitamins, herbs, and supplements. General instructions  You may be given antibiotic medicine to help lower the risk of infection.  Plan to have a responsible adult take you home from the hospital or clinic.  If you will be going home right after the procedure, plan to have a responsible adult care for you for the time you are told. This is important. What happens during the procedure?  An IV will be inserted into one of your veins.  You will be given a medicine (local anesthetic) to numb your mouth, nose, throat, and voice box (larynx). You may also be given one or more of the following: ? A medicine to help you relax (sedative). ? A medicine to control coughing. ? A medicine to dry up any fluids or secretions in your lungs.  A bronchoscope will be passed into your nose or mouth, and into your lungs. Your health care provider will examine your lungs.  Samples of airway secretions may be collected for testing.  If abnormal areas are seen in your airways, samples of tissue may be removed and checked under a microscope (biopsy).  If tissue samples are needed from the outer parts of the lung, a type of X-ray (fluoroscopy) may be used to guide the bronchoscope to these areas.  If bleeding occurs, you may be given medicine to  stop or decrease the bleeding. The procedure may vary among health care providers and hospitals. What can I expect after the procedure?  Your blood pressure, heart rate, breathing rate, and blood oxygen level will be monitored until you leave the hospital or clinic.  You may have a chest X-ray to check for signs of pneumothorax.  You willnot be  allowed to eat or drink anything for 2 hours after your procedure.  If a biopsy was taken, it is up to you to get the results of the test. Ask your health care provider, or the department that is doing the procedure, when your results will be ready.  You may have the following symptoms for 24-48 hours: ? A cough that is worse than it was before the procedure. ? A low-grade fever. ? A sore throat or hoarse voice. ? Some blood in the mucus from your lungs (sputum), if a biopsy was done. Follow these instructions at home: Eating and drinking  Do not eat or drink anything, including water, for 2 hours after your procedure, or until your numbing medicine has worn off. Having a numb throat increases your risk of burning yourself or choking.  Start eating soft foods and slowly drinking liquids after your numbness is gone and your cough and gag reflexes have returned.  You may return to your normal diet the day after the procedure. Driving  If you were given a sedative during the procedure, it can affect you for several hours. Do not drive or operate machinery until your health care provider says that it is safe.  Ask your health care provider if the medicine prescribed to you requires you to avoid driving or using machinery.  Return to your normal activities as told by your health care provider. Ask your health care provider what activities are safe for you. General instructions  Take over-the-counter and prescription medicines only as told by your health care provider.  Do not use any products that contain nicotine or tobacco. These products include cigarettes, chewing tobacco, and vaping devices, such as e-cigarettes. If you need help quitting, ask your health care provider.  Keep all follow-up visits. This is important.   Get help right away if:  You have shortness of breath that gets worse.  You become light-headed or feel like you might faint.  You have chest pain.  You cough up  more than a small amount of blood. These symptoms may represent a serious problem that is an emergency. Do not wait to see if the symptoms will go away. Get medical help right away. Call your local emergency services (911 in the [place] Do not drive yourself to the hospital. Summary  Flexible bronchoscopy is a procedure that allows your health care provider to look closely inside your lungs and to take testing samples if needed.  Risks of flexible bronchoscopy include bleeding, infection, and collapsed lung (pneumothorax).  Before the procedure, you will be given a medicine to numb your mouth, nose, throat, and voice box. Then, a bronchoscope will be passed into your nose or mouth, and into your lungs.  After the procedure, your blood pressure, heart rate, breathing rate, and blood oxygen level will be monitored until you leave the hospital or clinic. You may have a chest X-ray to check for signs of pneumothorax.  You will not be allowed to eat or drink anything for 2 hours after your procedure. This information is not intended to replace advice given to you by your health care provider.  Make sure you discuss any questions you have with your health care provider. Document Revised: 08/27/2019 Document Reviewed: 08/27/2019 Elsevier Patient Education  [place].

## 2020-04-05 NOTE — Op Note (Signed)
Bronchoscopy Procedure Note  Ebony Scott  [id number]  02-28-1950  Date:04/05/20  Time:2:34 PM   Provider Performing:Gussie Towson B Delisa Finck   Procedure(s):  Flexible bronchoscopy with bronchial alveolar lavage (98264)  Indication(s) Pulmonary Nodules  Consent Risks of the procedure as well as the alternatives and risks of each were explained to the patient and/or caregiver.  Consent for the procedure was obtained and is signed in the bedside chart  Anesthesia General  Time Out Verified patient identification, verified procedure, site/side was marked, verified correct patient position, special equipment/implants available, medications/allergies/relevant history reviewed, required imaging and test results available.   Sterile Technique Usual hand hygiene, masks, gowns, and gloves were used   Procedure Description Bronchoscope advanced through endotracheal tube and into airway.  Airways were examined down to subsegmental level with findings noted below.   Following diagnostic evaluation, BAL(s) performed in the RUL and LLL with normal saline and return of 5m and 470mof fluid respectively.  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 Otherwise normal appearing airways. No bleeding noted. Thick white secretions coming from right lower lower were suctioned to clarity.    Complications/Tolerance None; patient tolerated the procedure well. Chest X-ray is needed post procedure.   EBL Minimal   Specimen(s) RUL BAL - cytology, cell count, respiratory culture, AFB culture and fungal culture LLL BAL - cytology, cell count, respiratory culture, AFB culture and fungal culture

## 2020-04-05 NOTE — Anesthesia Preprocedure Evaluation (Addendum)
Anesthesia Evaluation  Patient identified by MRN, date of birth, ID band Patient awake    Reviewed: Allergy & Precautions, NPO status , Patient's Chart, lab work & pertinent test results  Airway Mallampati: III  TM Distance: >3 FB Neck ROM: Full    Dental no notable dental hx.    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 Hyperdynamic LV function; grade 1 diastolic dysfunction; moderate LVH; trace AI. 2. Left ventricular ejection fraction, by estimation, is >75%. The left ventricle has hyperdynamic function. The left ventricle has no regional wall motion abnormalities. There is moderate left ventricular hypertrophy. Left ventricular diastolic parameters are consistent with Grade I diastolic dysfunction (impaired relaxation). 3. Right ventricular systolic function is normal. The right ventricular size is normal. 4. The mitral valve is normal in structure. No evidence of mitral valve regurgitation. No evidence of mitral stenosis. 5. The aortic valve has an indeterminant number of cusps. Aortic valve regurgitation is trivial. No aortic stenosis is present.   Neuro/Psych  Headaches, PSYCHIATRIC DISORDERS Anxiety Depression    GI/Hepatic Neg liver ROS, hiatal hernia, GERD  Medicated and Controlled,IBS (irritable bowel syndrome)   Endo/Other  Hypothyroidism   Renal/GU Renal disease     Musculoskeletal  (+) Arthritis , Fibromyalgia -Gout   Abdominal   Peds  Hematology  (+) anemia , HLD   Anesthesia Other Findings LUNG NODULES  COUGH  Reproductive/Obstetrics                            Anesthesia Physical Anesthesia Plan  ASA: IV  Anesthesia Plan: General   Post-op Pain  Management:    Induction: Intravenous  PONV Risk Score and Plan: 3 and Ondansetron, Dexamethasone, Midazolam and Treatment may vary due to age or medical condition  Airway Management Planned: Oral ETT and Video Laryngoscope Planned  Additional Equipment:   Intra-op Plan:   Post-operative Plan: Extubation in OR  Informed Consent: I have reviewed the patients History and Physical, chart, labs and discussed the procedure including the risks, benefits and alternatives for the proposed anesthesia with the patient or authorized representative who has indicated his/her understanding and acceptance.     Dental advisory given  Plan Discussed with: CRNA  Anesthesia Plan Comments:        Anesthesia Quick Evaluation

## 2020-04-05 NOTE — Transfer of Care (Addendum)
Immediate Anesthesia Transfer of Care Note  Patient: Ebony Scott  Procedure(s) Performed: VIDEO BRONCHOSCOPY WITHOUT FLUORO (N/A ) BRONCHIAL WASHINGS  Patient Location: PACU and Endoscopy Unit  Anesthesia Type:General  Level of Consciousness: awake, alert  and oriented  Airway & Oxygen Therapy: Patient Spontanous Breathing and Patient connected to face mask oxygen  Post-op Assessment: Report given to RN and Post -op Vital signs reviewed and stable  Post vital signs: Reviewed and stable  Last Vitals:  Vitals Value Taken Time  BP 162/79 04/05/20 1320  Temp    Pulse 91 04/05/20 1323  Resp 20 04/05/20 1323  SpO2 98 % 04/05/20 1323  Vitals shown include unvalidated device data.  Last Pain:  Vitals:   04/05/20 1137  TempSrc: Oral  PainSc: 0-No pain         Complications: No complications documented.

## 2020-04-05 NOTE — Anesthesia Procedure Notes (Signed)
Procedure Name: Intubation Date/Time: 04/05/2020 12:50 PM Performed by: Maxwell Caul, CRNA Pre-anesthesia Checklist: Patient identified, Emergency Drugs available, Suction available and Patient being monitored Patient Re-evaluated:Patient Re-evaluated prior to induction Oxygen Delivery Method: Circle system utilized Preoxygenation: Pre-oxygenation with 100% oxygen Induction Type: IV induction Ventilation: Mask ventilation without difficulty Laryngoscope Size: Glidescope and 3 Tube type: Oral Tube size: 9.0 mm Number of attempts: 1 Airway Equipment and Method: Stylet Placement Confirmation: ETT inserted through vocal cords under direct vision,  positive ETCO2 and breath sounds checked- equal and bilateral Secured at: 21 cm Tube secured with: Tape Dental Injury: Teeth and Oropharynx as per pre-operative assessment  Comments: Elective Glidescope intubation due to prior intubation note and discussion with Dr Roanna Banning.

## 2020-04-05 NOTE — Interval H&P Note (Signed)
History and Physical Interval Note:  04/05/2020 6:39 PM  Ebony Scott  has presented today for surgery, with the diagnosis of [place].  The various methods of treatment have been discussed with the patient and family. After consideration of risks, benefits and other options for treatment, the patient has consented to  Procedure(s): VIDEO BRONCHOSCOPY WITHOUT FLUORO (N/A) BRONCHIAL WASHINGS as a surgical intervention.  The patient's history has been reviewed, patient examined, no change in status, stable for surgery.  I have reviewed the patient's chart and labs.  Questions were answered to the patient's satisfaction.     Freddi Starr

## 2020-04-05 NOTE — Anesthesia Postprocedure Evaluation (Signed)
Anesthesia Post Note  Patient: Ebony Scott  Procedure(s) Performed: VIDEO BRONCHOSCOPY WITHOUT FLUORO (N/A ) BRONCHIAL WASHINGS     Patient location during evaluation: Endoscopy Anesthesia Type: General Level of consciousness: awake Pain management: pain level controlled Vital Signs Assessment: post-procedure vital signs reviewed and stable Respiratory status: spontaneous breathing, nonlabored ventilation, respiratory function stable and patient connected to nasal cannula oxygen Cardiovascular status: blood pressure returned to baseline and stable Postop Assessment: no apparent nausea or vomiting Anesthetic complications: no   No complications documented.  Last Vitals:  Vitals:   04/05/20 1450 04/05/20 1500  BP: 123/61 (!) 115/59  Pulse: 73 72  Resp: 18 19  Temp:    SpO2: 90% 95%    Last Pain:  Vitals:   04/05/20 1500  TempSrc:   PainSc: 0-No pain                 Goldie Tregoning P Keri Tavella

## 2020-04-06 ENCOUNTER — Encounter (HOSPITAL_COMMUNITY): Payer: Self-pay | Admitting: Pulmonary Disease

## 2020-04-06 ENCOUNTER — Telehealth: Payer: Self-pay | Admitting: Pulmonary Disease

## 2020-04-06 ENCOUNTER — Other Ambulatory Visit: Payer: Self-pay | Admitting: Pulmonary Disease

## 2020-04-06 DIAGNOSIS — R918 Other nonspecific abnormal finding of lung field: Secondary | ICD-10-CM

## 2020-04-06 LAB — PNEUMOCYSTIS JIROVECI SMEAR BY DFA
Pneumocystis jiroveci Ag: NEGATIVE
Pneumocystis jiroveci Ag: NEGATIVE

## 2020-04-06 MED FILL — MUPIROCIN 2% OINTMENT: 2 | 7 days supply | Qty: 22 | Fill #0

## 2020-04-06 NOTE — Progress Notes (Signed)
BAL Cell count and differential orders placed.   Freda Jackson, MD [facility] Office: [phone]   See Amion for Pager Details

## 2020-04-06 NOTE — Telephone Encounter (Signed)
Created in error

## 2020-04-07 LAB — CYTOLOGY - NON PAP

## 2020-04-08 ENCOUNTER — Ambulatory Visit: Payer: Medicare PPO | Admitting: Pulmonary Disease

## 2020-04-08 LAB — CULTURE, RESPIRATORY W GRAM STAIN
Culture: NO GROWTH
Culture: NORMAL
Gram Stain: NONE SEEN

## 2020-04-08 LAB — ACID FAST SMEAR (AFB, MYCOBACTERIA)
Acid Fast Smear: NEGATIVE
Acid Fast Smear: NEGATIVE

## 2020-04-11 ENCOUNTER — Telehealth: Payer: Self-pay | Admitting: Pulmonary Disease

## 2020-04-11 NOTE — Telephone Encounter (Signed)
Returned patient's call. Cultures remain negative to date from bronchosocpy and cytology is negative for malignant cells.   Freda Jackson, MD [facility] Office: [phone]   See Amion for Pager Details

## 2020-04-11 NOTE — Telephone Encounter (Signed)
JD requested that this call be routed to him, he will call the pt once he is done in clinic.

## 2020-04-12 ENCOUNTER — Other Ambulatory Visit: Payer: Self-pay

## 2020-04-12 MED ORDER — BENZONATATE 200 MG PO CAPS: 200.0000 mg | ORAL_CAPSULE | Freq: Two times a day (BID) | ORAL | 0 refills | Status: DC | PRN

## 2020-04-12 NOTE — Telephone Encounter (Signed)
Medication has been sent.  

## 2020-04-14 ENCOUNTER — Other Ambulatory Visit: Payer: Self-pay

## 2020-04-14 ENCOUNTER — Ambulatory Visit: Payer: Medicare PPO | Admitting: Cardiology

## 2020-04-14 ENCOUNTER — Encounter: Payer: Self-pay | Admitting: Cardiology

## 2020-04-14 VITALS — BP 142/70 | HR 94 | Ht 61.0 in | Wt 137.4 lb

## 2020-04-14 DIAGNOSIS — I5032 Chronic diastolic (congestive) heart failure: Secondary | ICD-10-CM | POA: Diagnosis not present

## 2020-04-14 DIAGNOSIS — I2510 Atherosclerotic heart disease of native coronary artery without angina pectoris: Secondary | ICD-10-CM

## 2020-04-14 DIAGNOSIS — R0602 Shortness of breath: Secondary | ICD-10-CM

## 2020-04-14 DIAGNOSIS — E782 Mixed hyperlipidemia: Secondary | ICD-10-CM

## 2020-04-14 DIAGNOSIS — R600 Localized edema: Secondary | ICD-10-CM | POA: Diagnosis not present

## 2020-04-14 NOTE — Patient Instructions (Signed)
Medication Instructions:  Your Physician recommend you continue on your current medication as directed.    *If you need a refill on your cardiac medications before your next appointment, please call your pharmacy*   Lab Work: None   Testing/Procedures: None   Follow-Up: At [facility], you and your health needs are our priority.  As part of our continuing mission to provide you with exceptional heart care, we have created designated Provider Care Teams.  These Care Teams include your primary Cardiologist (physician) and Advanced Practice Providers (APPs -  Physician Assistants and Nurse Practitioners) who all work together to provide you with the care you need, when you need it.  We recommend signing up for the patient portal called "MyChart".  Sign up information is provided on this After Visit Summary.  MyChart is used to connect with patients for Virtual Visits (Telemedicine).  Patients are able to view lab/test results, encounter notes, upcoming appointments, etc.  Non-urgent messages can be sent to your provider as well.   To learn more about what you can do with MyChart, go to [email or web address].    Your next appointment:   1 year(s)  The format for your next appointment:   In Person  Provider:   Bridgette Christopher, MD     

## 2020-04-14 NOTE — Progress Notes (Signed)
Cardiology Office Note:    Date:  04/14/2020   ID:  Ebony Scott, DOB Apr 24, 1950, MRN [id number]  PCP:  Ebony Frizzle, MD  Cardiologist:  Ebony Dresser, MD PhD  Referring MD: Ebony Frizzle, MD   CC: follow up  History of Present Illness:    Ebony Scott is a 70 y.o. 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 Initial consult was 10/15/17.  Cardiac history: had CT chest 07/2017 which noted diffuse calcification, including aorta, aortic branch vessels, and coronary arteries. Had a normal Lexiscan in the past. No chest pain, main longstanding symptom is shortness of breath, followed by pulmonary. No history of clinical ASCVD events. History of DVT/PE.  Today: S/P recent bronchoscopy after post op CT showed lung abnormalities. Thus far cultures are negative, cytology without malignancy.  She was readmitted for altered mental status after recent shoulder surgery. This was thought to be due to baclofen, and symptoms improved after baclofen was stopped.   She continues to have shortness of breath as her primary issue. She is following closely with pulmonology about this. On O2, dyspneic with minimal exertion.  Denies chest pain. No PND, orthopnea, LE edema or unexpected weight gain. No syncope or palpitations.  Past Medical History:  Diagnosis Date  . Acute deep vein thrombosis (DVT) of right lower extremity ([place]) 12/13/2017  . Allergic rhinitis   . Allergy    SEASONAL  . Anemia   . Anxiety    pt denies  . Arthritis    Phreesia 10/21/2019  . Asthma   . Asthma    Phreesia 10/21/2019  . Breast cancer ([place]) 1998   in remission  . Cataract    REMOVED  . Clostridium difficile colitis 01/14/2018  . Clostridium difficile diarrhea  01/14/2018  . Complication of anesthesia    "had hard time waking up from it several times" (02/20/2012)  . Depression    "some; don't take anything for it" (02/20/2012)  . Diverticulosis   . DVT (deep venous thrombosis) ([place])   . Exertional dyspnea   . Fibromyalgia 11/2011  . GERD (gastroesophageal reflux disease)   . Graves disease   . Headache(784.0)    "related to allergies; more at different times during the year" (02/20/2012)  . Hemorrhoids   . Hiatal hernia    back and neck  . Hx of adenomatous colonic polyps 04/12/2016  . Hypercholesteremia    good cholesterol is high  . Hypothyroidism   . IBS (irritable bowel syndrome)   . Moderate persistent asthma    -FeV1 72% 2011, -IgE 102 2011, CT sinus Neg 2011  . Osteoporosis    on reclast yearly  . Peripheral vascular disease ([place]) 2019   DVTs  . Pneumonia 04/2011; ~ 11/2011   "double; single" (02/20/2012)  . Seronegative rheumatoid arthritis ([place])    Dr. Lahoma Rocker  . SIRS (systemic inflammatory response syndrome) ([place]) 02/10/2018  . Tracheobronchomalacia     Past Surgical History:  Procedure Laterality Date  . ABDOMINAL HYSTERECTOMY N/A    Phreesia 10/21/2019  . ANTERIOR AND POSTERIOR REPAIR  1990's  . APPENDECTOMY    . BREAST LUMPECTOMY  1998   left  . BREAST SURGERY N/A    Phreesia 10/21/2019  . BRONCHIAL WASHINGS  04/05/2020   Procedure: BRONCHIAL WASHINGS;  Surgeon: Erin Fulling,  Ebony Horsfall, MD;  Location: [facility];  Service: Pulmonary;;  . CARPOMETACARPEL ([place]) FUSION OF THUMB WITH AUTOGRAFT FROM RADIUS  ~ 2009   "both thumbs" (02/20/2012)  . CATARACT EXTRACTION W/ INTRAOCULAR LENS  IMPLANT, BILATERAL  2012  . CERVICAL DISCECTOMY  10/2001   C5-C6  . CERVICAL FUSION  2003   C3-C4  . CHOLECYSTECTOMY    . COLONOSCOPY    . DEBRIDEMENT TENNIS ELBOW  ?1970's   right  . ESOPHAGOGASTRODUODENOSCOPY    . EYE SURGERY N/A    Phreesia 10/21/2019  . HYSTERECTOMY    . KNEE ARTHROPLASTY  ?1990's   "?right; w/cartilage  repair" (02/20/2012)  . NASAL SEPTUM SURGERY  1980's  . POSTERIOR CERVICAL FUSION/FORAMINOTOMY  2004   "failed initial fusion; rewired  anterior neck" (02/20/2012)  . REVERSE SHOULDER ARTHROPLASTY Right 02/16/2020   Procedure: REVERSE SHOULDER ARTHROPLASTY;  Surgeon: Ebony Bond, MD;  Location: [facility];  Service: Orthopedics;  Laterality: Right;  . SPINE SURGERY N/A    Phreesia 10/21/2019  . TONSILLECTOMY  ~ 1953  . VESICOVAGINAL FISTULA CLOSURE W/ TAH  1988  . VIDEO BRONCHOSCOPY Bilateral 08/23/2016   Procedure: VIDEO BRONCHOSCOPY WITH FLUORO;  Surgeon: Ebony Glazier, MD;  Location: [facility];  Service: Cardiopulmonary;  Laterality: Bilateral;  . VIDEO BRONCHOSCOPY N/A 04/05/2020   Procedure: VIDEO BRONCHOSCOPY WITHOUT FLUORO;  Surgeon: Ebony Starr, MD;  Location: [facility];  Service: Pulmonary;  Laterality: N/A;    Current Medications: Current Outpatient Medications on File Prior to Visit  Medication Sig  . acetaminophen (TYLENOL) 650 MG CR tablet Take 1,300 mg by mouth every 8 (eight) hours as needed for pain.  Marland Kitchen albuterol (PROVENTIL) (2.5 MG/3ML) 0.083% nebulizer solution Take 3 mLs (2.5 mg total) by nebulization in the morning and at bedtime. Along with flutter valve  . allopurinol (ZYLOPRIM) 100 MG tablet Take 100 mg by mouth daily.  . Alpha-D-Galactosidase (BEANO PO) Take 2-3 capsules by mouth 3 (three) times daily with meals.  . Alpha-Lipoic Acid 600 MG CAPS Take 600 mg by mouth daily.   Marland Kitchen ammonium lactate (AMLACTIN) 12 % cream Apply topically as needed for dry skin. (Patient taking differently: Apply 1 g topically at bedtime.)  . atorvastatin (LIPITOR) 10 MG tablet TAKE 1 TABLET BY MOUTH DAILY  . azelastine (ASTELIN) 0.1 % nasal spray USE 2 SPRAYS IN EACH NOSTRIL DAILY AS DIRECTED (Patient taking differently: Place 2 sprays into both nostrils daily as needed for allergies.)  . benzonatate (TESSALON) 200 MG capsule Take 1 capsule (200 mg total) by mouth 2 (two) times  daily as needed for cough.  . Calcium Citrate (CITRACAL PO) Take 1 tablet by mouth in the morning and at bedtime.  Marland Kitchen CALCIUM-MAGNESIUM PO Take 1 tablet by mouth 2 (two) times daily.  . cetirizine (ZYRTEC) 10 MG tablet Take 10 mg by mouth daily.  . cholecalciferol (VITAMIN D) 25 MCG (1000 UT) tablet Take 1,000 Units by mouth daily.   . cyproheptadine (PERIACTIN) 4 MG tablet TAKE 2 TABLETS BY MOUTH EVERY EVENING (Patient taking differently: Take 8 mg by mouth every evening.)  . denosumab (PROLIA) 60 MG/ML SOLN injection Inject 60 mg into the skin every 6 (six) months. Administer in upper arm, thigh, or abdomen  . DEXILANT 60 MG capsule TAKE 1 CAPSULE BY MOUTH EACH MORNING (Patient taking differently: Take 60 mg by mouth daily.)  . diclofenac Sodium (VOLTAREN) 1 % GEL APPLY 2 GRAMS TOPICALLY TWICE DAILY (Patient taking differently: Apply 1 application topically 4 (four)  times daily as needed (joint pain).)  . DULERA 200-5 MCG/ACT AERO INHALE 2 PUFFS BY MOUTH TWICE DAILY (Patient taking differently: Inhale 2 puffs into the lungs 2 (two) times daily.)  . EPINEPHrine (EPIPEN 2-PAK) 0.3 mg/0.3 mL IJ SOAJ injection Inject 0.3 mLs (0.3 mg total) into the muscle Once PRN. (Patient taking differently: Inject 0.3 mg into the muscle as needed for anaphylaxis.)  . famotidine (PEPCID) 20 MG tablet TAKE 1 TABLET BY MOUTH TWICE A DAY (Patient taking differently: Take 20 mg by mouth 2 (two) times daily.)  . fluticasone (FLONASE) 50 MCG/ACT nasal spray Place 2 sprays into both nostrils daily.  . folic acid (FOLVITE) 1 MG tablet Take 1 mg by mouth daily.  . furosemide (LASIX) 20 MG tablet Take 1 tablet (20 mg total) by mouth every evening. Take 40 mg in the AM, and take additional 20 mg in the PM for swelling (Patient taking differently: Take 20 mg by mouth 2 (two) times daily. Take 40 mg in the AM, and take additional 20 mg in the PM for swelling)  . furosemide (LASIX) 40 MG tablet Take 1 tablet (40 mg total) by mouth  daily as needed.  . gabapentin (NEURONTIN) 600 MG tablet TAKE 1 TABLET BY MOUTH DAILY (Patient taking differently: Take 600 mg by mouth at bedtime.)  . guaiFENesin (MUCINEX) 600 MG 12 hr tablet Take 600 mg by mouth 2 (two) times daily.  . hydrocortisone 2.5 % cream Apply topically 2 (two) times daily. (Patient taking differently: Apply topically 2 (two) times daily as needed (hemorrhoids).)  . Lactase 9000 units TABS Take 18,000-27,000 Units by mouth 4 (four) times daily as needed (dairy consumption).  Marland Kitchen leflunomide (ARAVA) 20 MG tablet Take 20 mg by mouth daily.  Marland Kitchen lidocaine (LIDODERM) 5 % Place 1 patch onto the skin daily as needed (pain). Remove & Discard patch within 12 hours or as directed by MD  . magnesium oxide (MAG-OX) 400 MG tablet Take 400 mg by mouth daily.  . mirtazapine (REMERON SOL-TAB) 30 MG disintegrating tablet DISSOLVE 1 TABLET BY MOUTH EVERY NIGHT AT BEDTIME (Patient taking differently: Take 30 mg by mouth at bedtime.)  . MITIGARE 0.6 MG CAPS Take 0.6 mg by mouth every other day.  . montelukast (SINGULAIR) 10 MG tablet TAKE 1 TABLET BY MOUTH DAILY (Patient taking differently: Take 10 mg by mouth every evening.)  . mupirocin cream (BACTROBAN) 2 % Apply 1 application topically 2 (two) times daily.  Marland Kitchen Nystatin (GERHARDT'S BUTT CREAM) CREA Apply 1 application topically daily as needed (hemorrhoids). NYSTATIN 100,000 UNIT/GM CR 100000 CRM- 60 g, ZINC OXIDE 20 % OINT 20 OINT- 60 g, HYDROCORTISONE 1 % CREAM- 60 g  . nystatin (MYCOSTATIN) 100000 UNIT/ML suspension SWISH AND SPIT 5 MLS TWICE DAILY (Patient taking differently: Use as directed 5 mLs in the mouth or throat at bedtime.)  . nystatin cream (MYCOSTATIN) Apply 1 application topically 2 (two) times daily.  Marland Kitchen nystatin-triamcinolone ointment (MYCOLOG) Apply topically 2 (two) times daily. (Patient taking differently: Apply 1 application topically 2 (two) times daily as needed (yeast infections).)  . ondansetron (ZOFRAN) 4 MG tablet  Take 1 tablet (4 mg total) by mouth every 8 (eight) hours as needed for nausea or vomiting.  . predniSONE (DELTASONE) 5 MG tablet Take 15 mg by mouth daily with breakfast.  . Prenatal Vit-Fe Fumarate-FA (PRENATAL MULTIVITAMIN) TABS tablet Take 1 tablet by mouth daily.  . Probiotic Product (ALIGN PO) Take 1 capsule by mouth daily.  Marland Kitchen Propylene Glycol (  SYSTANE COMPLETE) 0.6 % SOLN Place 1 drop into both eyes 2 (two) times daily as needed (dry eyes).  Marland Kitchen Respiratory Therapy Supplies (FLUTTER) DEVI Use as directed  . Salicylic Acid-Urea (KERASAL EX) Apply 1 application topically at bedtime.  . sodium chloride HYPERTONIC 3 % nebulizer solution Take by nebulization 2 (two) times daily.  Marland Kitchen Spacer/Aero-Holding Chambers (AEROCHAMBER PLUS) inhaler Use as instructed  . Tiotropium Bromide Monohydrate (SPIRIVA RESPIMAT) 2.5 MCG/ACT AERS Inhale 1 puff into the lungs daily.  Alveda Reasons 20 MG TABS tablet TAKE 1 TABLET BY MOUTH DAILY WITH SUPPER (Patient taking differently: Take 20 mg by mouth every evening.)   Current Facility-Administered Medications on File Prior to Visit  Medication  . denosumab (PROLIA) injection 60 mg     Allergies:   Dust mite extract, Molds & smuts, Morphine and related, Other, Penicillins, Reclast [zoledronic acid], Rofecoxib, Shellfish allergy, Tetracycline hcl, Xolair [omalizumab], Dilaudid [hydromorphone hcl], Hydrocodone-acetaminophen, Levofloxacin, Oxycodone hcl, Paroxetine, Baclofen, Celecoxib, Diltiazem, Lactose intolerance (gi), and Tree extract   Social History   Tobacco Use  . Smoking status: Passive Smoke Exposure - Never Smoker  . Smokeless tobacco: Never Used  . Tobacco comment: Parents  Vaping Use  . Vaping Use: Never used  Substance Use Topics  . Alcohol use: Not Currently    Alcohol/week: 0.0 standard drinks  . Drug use: No    Family History: The patient's family history includes Allergies in her father and mother; Arthritis in her father and mother; Colitis  in her daughter; Colon cancer in an other family member; Diabetes in her maternal grandfather and mother; Heart disease in her father and mother; Lung cancer in her mother; Stroke in her father. Father had 4V CABG and valve replacement at age 26; had a complication with ruptured aorta after surgery and passed away. Mother had lung cancer and diabetes. No other significant heart disease or risks.   ROS:   Please see the history of present illness.  Additional pertinent ROS otherwise unremarkable.  EKGs/Labs/Other Studies Reviewed:    The following studies were reviewed today: Echo 07/10/19 1. Hyperdynamic LV function; grade 1 diastolic dysfunction; moderate LVH;  trace AI.  2. Left ventricular ejection fraction, by estimation, is >75%. The left  ventricle has hyperdynamic function. The left ventricle has no regional  wall motion abnormalities. There is moderate left ventricular hypertrophy.  Left ventricular diastolic  parameters are consistent with Grade I diastolic dysfunction (impaired  relaxation).  3. Right ventricular systolic function is normal. The right ventricular  size is normal.  4. The mitral valve is normal in structure. No evidence of mitral valve  regurgitation. No evidence of mitral stenosis.  5. The aortic valve has an indeterminant number of cusps. Aortic valve  regurgitation is trivial. No aortic stenosis is present.  Echo 08/05/17 Study Conclusions  - Left ventricle: The cavity size was normal. There was mild focal   basal hypertrophy of the septum. Systolic function was vigorous.   The estimated ejection fraction was in the range of 65% to 70%.   Wall motion was normal; there were no regional wall motion   abnormalities. Doppler parameters are consistent with abnormal   left ventricular relaxation (grade 1 diastolic dysfunction).  Impressions:  - Definity used; vigorous LV systolic function; mild diastolic   dysfunction.  CT chest high resolution  6.17.19 FINDINGS: Cardiovascular: Heart size is normal. There is no significant pericardial fluid, thickening or pericardial calcification. There is aortic atherosclerosis, as well as atherosclerosis of the great vessels  of the mediastinum and the coronary arteries, including calcified atherosclerotic plaque in the left main, left anterior descending left circumflex coronary arteries.  IMPRESSION: 1. No findings to suggest interstitial lung disease. 2. Mild air trapping indicative of mild small airways disease. 3. Aortic atherosclerosis, in addition to left main and 2 vessel coronary artery disease. Please note that although the presence of coronary artery calcium documents the presence of coronary artery disease, the severity of this disease and any potential stenosis cannot be assessed on this non-gated CT examination. Assessment for potential risk factor modification, dietary therapy or pharmacologic therapy may be warranted, if clinically indicated. 4. Additional incidental findings, as above.  Stress 2011:  Stress echo 11/2009 Study Conclusions - Stress ECG conclusions: There were no stress arrhythmias or conduction abnormalities. The stress ECG was normal. - Staged echo: There was no echocardiographic evidence for stress-induced ischemia. Impressions: - Nondiagnostic stress echocardiogram with nochest pain, no ST changes; patient did not achieve target heart rate; no stress-induced wall motion abnormalities.  Had [place] 12/2009 due to nondiagnostic stress echo: Stress Procedure  The patient received IV Lexiscan 0.4 mg over 15-seconds with concurrent low level exercise and then Technetium 7m Tetrofosmin was injected at 30-seconds while the patient continued walking one more minute.  There were no significant changes with Lexiscan.  Quantitative spect images were obtained after a 45 minute delay.  QPS Raw Data Images:  Normal; no motion artifact; normal heart/lung  ratio. Stress Images:  Normal homogeneous uptake in all areas of the myocardium. Rest Images:  Normal homogeneous uptake in all areas of the myocardium. Subtraction (SDS):  No evidence of ischemia. Transient Ischemic Dilatation:  1.15  (Normal <1.22)  Lung/Heart Ratio:  .27  (Normal <0.45)  Quantitative Gated Spect Images QGS EDV:  52 ml QGS ESV:  13 ml QGS EF:  75 % QGS cine images:  Normal motion  Findings Normal nuclear study  EKG:  EKG is personally reviewed today.  The ekg ordered 02/23/20 demonstrates sinus tachycardia a 106 bpm, lack of anterior forces V1/V2  Recent Labs: 05/07/2019: Brain Natriuretic Peptide 41 02/23/2020: TSH 1.569 02/28/2020: Magnesium 1.9 03/31/2020: ALT 62; BUN 31; Creat 1.19; Hemoglobin 9.5; Platelets 251; Potassium 4.1; Sodium 140  Recent Lipid Panel    Component Value Date/Time   CHOL 192 07/04/2018 1017   TRIG 212 (H) 07/04/2018 1017   HDL 84 07/04/2018 1017   CHOLHDL 2.3 07/04/2018 1017   VLDL 27 08/20/2016 1103   LDLCALC 77 07/04/2018 1017    Physical Exam:    VS:  BP (!) 142/70   Pulse 94   Ht 5\' 1"  (1.549 m)   Wt 137 lb 6.4 oz (62.3 kg)   SpO2 91%   BMI 25.96 kg/m     Wt Readings from Last 3 Encounters:  04/14/20 137 lb 6.4 oz (62.3 kg)  04/05/20 132 lb 0.9 oz (59.9 kg)  04/01/20 136 lb 12.8 oz (62.1 kg)    GEN: frail appearing, on O2 by nasal cannula, dyspneic with minimal exertion HEENT: Normal, moist mucous membranes NECK: No JVD CARDIAC: regular rhythm, normal S1 and S2, no rubs or gallops. No murmur. VASCULAR: Radial and DP pulses 2+ bilaterally. No carotid bruits RESPIRATORY:  Bronchial breath sounds with mild rattling ABDOMEN: Soft, non-tender, non-distended MUSCULOSKELETAL:  Ambulates independently with walker SKIN: Warm and dry, bilateral 1+ ankle edema NEUROLOGIC:  Alert and oriented x 3. No focal neuro deficits noted. PSYCHIATRIC:  Normal affect   ASSESSMENT:    1. SOB (shortness  of breath)   2. Bilateral leg  edema   3. Chronic diastolic CHF (congestive heart failure) ([place])   4. Coronary artery calcification seen on CT scan   5. Mixed hyperlipidemia    PLAN:    Bilateral LE edema, shortness of breath, chronic diastolic heart failure -echo 06/2019 with hyperdynamic EF and only grade 1 diastolic dysfunction -weight up slightly but within her recent range, mild LE edema today -is undergoing pulmonary eval as noted. Suspect this is largely driving her symptoms -prior BNP normal -counseled on heart failure education -counseled on daily weights, salt avoidance, leg elevation and compression -continue lasix  Coronary calcium seen on CT scan: suggests CAD equivalent -asymptomatic -prior negative nuclear stress, echo without WMA -counseled on prevention, below  Hypercholesterolemia with goal LDL <70, hypertriglyceridemia (unknown if fasting) -lipids from 07/04/2018 [facility] reviewed -continue atorvastatin -LFTs normal 12/2018 per [facility]  Secondary prevention: -recommend heart healthy/Mediterranean diet, with whole grains, fruits, vegetable, fish, lean meats, nuts, and olive oil. Limit salt. -recommend moderate walking, 3-5 times/week for 30-50 minutes each session. Aim for at least 150 minutes.week. Goal should be pace of 3 miles/hours, or walking 1.5 miles in 30 minutes -recommend avoidance of tobacco products. Avoid excess alcohol. -Additional risk factor control:  -Diabetes: no history  -Lipids: as above  -Blood pressure control: at goal  -Weight: BMI 26 -no aspirin given plans for long term anticoagulation given prior DVT/PE  Plan for follow up: 1 year or sooner, as needed  Ebony Dresser, MD, PhD [place]  [facility]   Medication Adjustments/Labs and Tests Ordered: Current medicines are reviewed at length with the patient today.  Concerns regarding medicines are outlined above.  No orders of the defined types were placed in this encounter.  No orders of the defined types  were placed in this encounter.   Patient Instructions  Medication Instructions:  Your Physician recommend you continue on your current medication as directed.    *If you need a refill on your cardiac medications before your next appointment, please call your pharmacy*   Lab Work: None  Testing/Procedures: None   Follow-Up: At [facility], you and your health needs are our priority.  As part of our continuing mission to provide you with exceptional heart care, we have created designated Provider Care Teams.  These Care Teams include your primary Cardiologist (physician) and Advanced Practice Providers (APPs -  Physician Assistants and Nurse Practitioners) who all work together to provide you with the care you need, when you need it.  We recommend signing up for the patient portal called "MyChart".  Sign up information is provided on this After Visit Summary.  MyChart is used to connect with patients for Virtual Visits (Telemedicine).  Patients are able to view lab/test results, encounter notes, upcoming appointments, etc.  Non-urgent messages can be sent to your provider as well.   To learn more about what you can do with MyChart, go to [email or web address].    Your next appointment:   1 year(s)  The format for your next appointment:   In Person  Provider:   Buford Dresser, MD      Signed, Ebony Dresser, MD PhD 04/14/2020   [place]

## 2020-04-16 ENCOUNTER — Encounter: Payer: Self-pay | Admitting: Family Medicine

## 2020-04-16 DIAGNOSIS — G4736 Sleep related hypoventilation in conditions classified elsewhere: Secondary | ICD-10-CM | POA: Diagnosis not present

## 2020-04-16 DIAGNOSIS — G4733 Obstructive sleep apnea (adult) (pediatric): Secondary | ICD-10-CM | POA: Diagnosis not present

## 2020-04-16 DIAGNOSIS — R0602 Shortness of breath: Secondary | ICD-10-CM | POA: Diagnosis not present

## 2020-04-16 DIAGNOSIS — J45909 Unspecified asthma, uncomplicated: Secondary | ICD-10-CM | POA: Diagnosis not present

## 2020-04-16 DIAGNOSIS — M0600 Rheumatoid arthritis without rheumatoid factor, unspecified site: Secondary | ICD-10-CM | POA: Diagnosis not present

## 2020-04-16 DIAGNOSIS — G894 Chronic pain syndrome: Secondary | ICD-10-CM | POA: Diagnosis not present

## 2020-04-16 DIAGNOSIS — J4540 Moderate persistent asthma, uncomplicated: Secondary | ICD-10-CM | POA: Diagnosis not present

## 2020-04-16 DIAGNOSIS — I5032 Chronic diastolic (congestive) heart failure: Secondary | ICD-10-CM | POA: Diagnosis not present

## 2020-04-16 DIAGNOSIS — J471 Bronchiectasis with (acute) exacerbation: Secondary | ICD-10-CM | POA: Diagnosis not present

## 2020-04-18 DIAGNOSIS — S46011D Strain of muscle(s) and tendon(s) of the rotator cuff of right shoulder, subsequent encounter: Secondary | ICD-10-CM | POA: Diagnosis not present

## 2020-04-19 ENCOUNTER — Telehealth: Payer: Self-pay

## 2020-04-19 NOTE — Telephone Encounter (Signed)
Forms faxed to [facility] home health at [phone]

## 2020-04-19 NOTE — Telephone Encounter (Signed)
No action needed at this time, closing open encounter

## 2020-04-26 ENCOUNTER — Encounter: Payer: Self-pay | Admitting: Allergy and Immunology

## 2020-04-26 ENCOUNTER — Ambulatory Visit: Payer: Medicare PPO | Admitting: Allergy and Immunology

## 2020-04-26 ENCOUNTER — Other Ambulatory Visit: Payer: Self-pay

## 2020-04-26 VITALS — BP 140/90 | HR 83 | Temp 97.5°F | Resp 16 | Ht 61.0 in | Wt 141.2 lb

## 2020-04-26 DIAGNOSIS — K219 Gastro-esophageal reflux disease without esophagitis: Secondary | ICD-10-CM | POA: Diagnosis not present

## 2020-04-26 DIAGNOSIS — G500 Trigeminal neuralgia: Secondary | ICD-10-CM

## 2020-04-26 DIAGNOSIS — J040 Acute laryngitis: Secondary | ICD-10-CM | POA: Diagnosis not present

## 2020-04-26 DIAGNOSIS — G478 Other sleep disorders: Secondary | ICD-10-CM

## 2020-04-26 DIAGNOSIS — J4550 Severe persistent asthma, uncomplicated: Secondary | ICD-10-CM

## 2020-04-26 DIAGNOSIS — J3089 Other allergic rhinitis: Secondary | ICD-10-CM

## 2020-04-26 NOTE — Patient Instructions (Addendum)
  1. 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 Periactin 4 mg tablet  2 tablets at bedtime  2. Continue to Treat laryngopharyngeal reflux:    A. Dexilant 60 mg in the morning  B. famotidine 40 mg daily  4.  Use Astelin and Zyrtec and nasal saline and nasal bactroban if needed  5.  Continue therapy directed by pulmonology and rheumatology  6.  Revisit with Dr. Wilburn Cornelia about chronic laryngitis of 2 months  7.  May require treatment for Candida laryngitis:  Discuss with Dr. Erin Fulling on Friday  8.  Return to clinic in 6 months or earlier if problem

## 2020-04-26 NOTE — Progress Notes (Unsigned)
[facility] [place] [facility] [place]   Follow-up Note  Referring Provider: [name], MD Primary Provider: [name], MD Date of Office Visit: 04/26/2020  Subjective:   Ebony Scott (DOB: 1950/07/02) is a 70 y.o. female who returns to the [facility] on 04/26/2020 in re-evaluation of the following:  HPI: [name] returns to this clinic in evaluation of allergic rhinoconjunctivitis and facial pain syndrome with sleep dysfunction and history of reflux as well as a history of asthma, tracheobronchial malacia, bronchiectasis, and rheumatoid arthritis.  I last saw her in this clinic on 08 September 2019.  The big issue for [name] at this point in time is that she has completely lost her voice over the course of the past 2 months and has an ongoing cough.  This cough feels as though she has an irritation in her throat.  She had a bronchoscopy performed in February 2022 although she does not know the results of that study.  She has a scheduled appointment to see Dr. Erin Fulling about this issue on Friday.  Her upper airway issue is under very good control at this point in time.  She did develop a problem with ear pain in the past and she is seeing Dr. Wilburn Cornelia, ENT, who is treating her with hydrocortisone drops the first week of every month.  Her reflux has been under very good control at this point.  Under good control for Ryah means that she still has esophageal dysfunction and obstruction about 3 times per week where food gets stuck in her mid chest and then she needs to drink to flush it down into her stomach.  She does not have any regurgitation.  Her asthma appears to be stable on therapy prescribed by [facility] pulmonology.  Allergies as of 04/26/2020      Reactions   Dust Mite Extract Shortness Of Breath, Other (See Comments)   "sneezing" (02/20/2012)   Molds & Smuts Shortness Of Breath   Morphine And Related Hives, Itching   Other Shortness Of Breath,  Other (See Comments)   Grass and weeds "sneezing; filled sinuses" (02/20/2012)   Penicillins Rash, Other (See Comments)   "welts" (02/20/2012) Has patient had a PCN reaction causing immediate rash, facial/tongue/throat swelling, SOB or lightheadedness with hypotension: Unknown Has patient had a PCN reaction causing severe rash involving mucus membranes or skin necrosis: No Has patient had a PCN reaction that required hospitalization: No Has patient had a PCN reaction occurring within the last 10 years: No If all of the above answers are "NO", then may proceed with Cephalosporin use. Tolerated Ancef 02/16/20.   Reclast [zoledronic Acid] Other (See Comments)   Fever, Put in hospital, dr said it was a reaction from a reaction    Rofecoxib Swelling   Vioxx REACTION: feet swelling   Shellfish Allergy Anaphylaxis, Shortness Of Breath, Itching, Rash   Tetracycline Hcl Nausea And Vomiting   Xolair [omalizumab] Other (See Comments)   Caused Blood clot   Dilaudid [hydromorphone Hcl] Itching   Hydrocodone-acetaminophen Nausea And Vomiting   Levofloxacin Other (See Comments)   GI upset   Oxycodone Hcl Nausea And Vomiting   Paroxetine Nausea And Vomiting   Paxil   Baclofen Other (See Comments)   Altered mental status requiring 3 day hospital stay    Celecoxib Swelling   Feet swelling   Diltiazem Swelling   Lactose Intolerance (gi) Other (See Comments)   Bloating and gas   Tree Extract  Other (See Comments)   "tested and told I was allergic to it; never experienced a reaction to it" (02/20/2012)      Medication List      acetaminophen 650 MG CR tablet Commonly known as: TYLENOL Take 1,300 mg by mouth every 8 (eight) hours as needed for pain.   AeroChamber Plus inhaler Use as instructed   albuterol (2.5 MG/3ML) 0.083% nebulizer solution Commonly known as: PROVENTIL Take 3 mLs (2.5 mg total) by nebulization in the morning and at bedtime. Along with flutter valve   ALIGN PO Take 1  capsule by mouth daily.   allopurinol 100 MG tablet Commonly known as: ZYLOPRIM Take 100 mg by mouth daily.   Alpha-Lipoic Acid 600 MG Caps Take 600 mg by mouth daily.   ammonium lactate 12 % cream Commonly known as: AMLACTIN Apply topically as needed for dry skin.   atorvastatin 10 MG tablet Commonly known as: LIPITOR TAKE 1 TABLET BY MOUTH DAILY   azelastine 0.1 % nasal spray Commonly known as: ASTELIN USE 2 SPRAYS IN EACH NOSTRIL DAILY AS DIRECTED   BEANO PO Take 2-3 capsules by mouth 3 (three) times daily with meals.   benzonatate 200 MG capsule Commonly known as: TESSALON Take 1 capsule (200 mg total) by mouth 2 (two) times daily as needed for cough.   CALCIUM-MAGNESIUM PO Take 1 tablet by mouth 2 (two) times daily.   cetirizine 10 MG tablet Commonly known as: ZYRTEC Take 10 mg by mouth daily.   cholecalciferol 25 MCG (1000 UNIT) tablet Commonly known as: VITAMIN D Take 1,000 Units by mouth daily.   CITRACAL PO Take 1 tablet by mouth in the morning and at bedtime.   cyproheptadine 4 MG tablet Commonly known as: PERIACTIN TAKE 2 TABLETS BY MOUTH EVERY EVENING   denosumab 60 MG/ML Soln injection Commonly known as: PROLIA Inject 60 mg into the skin every 6 (six) months. Administer in upper arm, thigh, or abdomen   Dexilant 60 MG capsule Generic drug: dexlansoprazole TAKE 1 CAPSULE BY MOUTH EACH MORNING   diclofenac Sodium 1 % Gel Commonly known as: VOLTAREN APPLY 2 GRAMS TOPICALLY TWICE DAILY   Dulera 200-5 MCG/ACT Aero Generic drug: mometasone-formoterol INHALE 2 PUFFS BY MOUTH TWICE DAILY   EPINEPHrine 0.3 mg/0.3 mL Soaj injection Commonly known as: EpiPen 2-Pak Inject 0.3 mLs (0.3 mg total) into the muscle Once PRN.   famotidine 20 MG tablet Commonly known as: PEPCID TAKE 1 TABLET BY MOUTH TWICE A DAY   fluticasone 50 MCG/ACT nasal spray Commonly known as: FLONASE Place 2 sprays into both nostrils daily.   Flutter Devi Use as directed    folic acid 1 MG tablet Commonly known as: FOLVITE Take 1 mg by mouth daily.   furosemide 40 MG tablet Commonly known as: LASIX Take 1 tablet (40 mg total) by mouth daily as needed.   furosemide 20 MG tablet Commonly known as: LASIX Take 1 tablet (20 mg total) by mouth every evening. Take 40 mg in the AM, and take additional 20 mg in the PM for swelling   gabapentin 600 MG tablet Commonly known as: NEURONTIN TAKE 1 TABLET BY MOUTH DAILY   Gerhardt's butt cream Crea Apply 1 application topically daily as needed (hemorrhoids). NYSTATIN 100,000 UNIT/GM CR 100000 CRM- 60 g, ZINC OXIDE 20 % OINT 20 OINT- 60 g, HYDROCORTISONE 1 % CREAM- 60 g   guaiFENesin 600 MG 12 hr tablet Commonly known as: MUCINEX Take 600 mg by mouth 2 (two) times daily.  hydrocortisone 2.5 % cream Apply topically 2 (two) times daily.   KERASAL EX Apply 1 application topically at bedtime.   Lactase 9000 units Tabs Take 18,000-27,000 Units by mouth 4 (four) times daily as needed (dairy consumption).   leflunomide 20 MG tablet Commonly known as: ARAVA Take 20 mg by mouth daily.   lidocaine 5 % Commonly known as: LIDODERM Place 1 patch onto the skin daily as needed (pain). Remove & Discard patch within 12 hours or as directed by MD   magnesium oxide 400 MG tablet Commonly known as: MAG-OX Take 400 mg by mouth daily.   mirtazapine 30 MG disintegrating tablet Commonly known as: REMERON SOL-TAB DISSOLVE 1 TABLET BY MOUTH EVERY NIGHT AT BEDTIME   Mitigare 0.6 MG Caps Generic drug: Colchicine Take 0.6 mg by mouth every other day.   montelukast 10 MG tablet Commonly known as: SINGULAIR TAKE 1 TABLET BY MOUTH DAILY   mupirocin cream 2 % Commonly known as: Bactroban Apply 1 application topically 2 (two) times daily.   nystatin 100000 UNIT/ML suspension Commonly known as: MYCOSTATIN SWISH AND SPIT 5 MLS TWICE DAILY   nystatin cream Commonly known as: MYCOSTATIN Apply 1 application topically 2  (two) times daily.   nystatin-triamcinolone ointment Commonly known as: MYCOLOG Apply topically 2 (two) times daily.   ondansetron 4 MG tablet Commonly known as: Zofran Take 1 tablet (4 mg total) by mouth every 8 (eight) hours as needed for nausea or vomiting.   predniSONE 5 MG tablet Commonly known as: DELTASONE Take 15 mg by mouth daily with breakfast.   prenatal multivitamin Tabs tablet Take 1 tablet by mouth daily.   sodium chloride HYPERTONIC 3 % nebulizer solution Take by nebulization 2 (two) times daily.   Spiriva Respimat 2.5 MCG/ACT Aers Generic drug: Tiotropium Bromide Monohydrate Inhale 1 puff into the lungs daily.   Systane Complete 0.6 % Soln Generic drug: Propylene Glycol Place 1 drop into both eyes 2 (two) times daily as needed (dry eyes).   Xarelto 20 MG Tabs tablet Generic drug: rivaroxaban TAKE 1 TABLET BY MOUTH DAILY WITH SUPPER       Past Medical History:  Diagnosis Date  . Acute deep vein thrombosis (DVT) of right lower extremity ([place]) 12/13/2017  . Allergic rhinitis   . Allergy    SEASONAL  . Anemia   . Angio-edema   . Anxiety    pt denies  . Arthritis    Phreesia 10/21/2019  . Asthma   . Asthma    Phreesia 10/21/2019  . Breast cancer ([place]) 1998   in remission  . Cataract    REMOVED  . Clostridium difficile colitis 01/14/2018  . Clostridium difficile diarrhea 01/14/2018  . Complication of anesthesia    "had hard time waking up from it several times" (02/20/2012)  . Depression    "some; don't take anything for it" (02/20/2012)  . Diverticulosis   . DVT (deep venous thrombosis) ([place])   . Exertional dyspnea   . Fibromyalgia 11/2011  . GERD (gastroesophageal reflux disease)   . Graves disease   . Headache(784.0)    "related to allergies; more at different times during the year" (02/20/2012)  . Hemorrhoids   . Hiatal hernia    back and neck  . Hx of adenomatous colonic polyps 04/12/2016  . Hypercholesteremia    good cholesterol is  high  . Hypothyroidism   . IBS (irritable bowel syndrome)   . Moderate persistent asthma    -FeV1 72% 2011, -IgE 102 2011,  CT sinus Neg 2011  . Osteoporosis    on reclast yearly  . Peripheral vascular disease ([place]) 2019   DVTs  . Pneumonia 04/2011; ~ 11/2011   "double; single" (02/20/2012)  . Recurrent upper respiratory infection (URI)   . Seronegative rheumatoid arthritis ([place])    Dr. Lahoma Rocker  . SIRS (systemic inflammatory response syndrome) ([place]) 02/10/2018  . Tracheobronchomalacia     Past Surgical History:  Procedure Laterality Date  . ABDOMINAL HYSTERECTOMY N/A    Phreesia 10/21/2019  . ANTERIOR AND POSTERIOR REPAIR  1990's  . APPENDECTOMY    . BREAST LUMPECTOMY  1998   left  . BREAST SURGERY N/A    Phreesia 10/21/2019  . BRONCHIAL WASHINGS  04/05/2020   Procedure: BRONCHIAL WASHINGS;  Surgeon: Freddi Starr, MD;  Location: [facility] END[facility]COPY;  Service: Pulmonary;;  . CARPOMETACARPEL ([place]) FUSION OF THUMB WITH AUTOGRAFT FROM RADIUS  ~ 2009   "both thumbs" (02/20/2012)  . CATARACT EXTRACTION W/ INTRAOCULAR LENS  IMPLANT, BILATERAL  2012  . CERVICAL DISCECTOMY  10/2001   C5-C6  . CERVICAL FUSION  2003   C3-C4  . CHOLECYSTECTOMY    . COLONOSCOPY    . DEBRIDEMENT TENNIS ELBOW  ?1970's   right  . ESOPHAGOGASTRODUODENOSCOPY    . EYE SURGERY N/A    Phreesia 10/21/2019  . HYSTERECTOMY    . KNEE ARTHROPLASTY  ?1990's   "?right; w/cartilage repair" (02/20/2012)  . NASAL SEPTUM SURGERY  1980's  . POSTERIOR CERVICAL FUSION/FORAMINOTOMY  2004   "failed initial fusion; rewired  anterior neck" (02/20/2012)  . REVERSE SHOULDER ARTHROPLASTY Right 02/16/2020   Procedure: REVERSE SHOULDER ARTHROPLASTY;  Surgeon: Marchia Bond, MD;  Location: [facility];  Service: Orthopedics;  Laterality: Right;  . SPINE SURGERY N/A    Phreesia 10/21/2019  . TONSILLECTOMY  ~ 1953  . VESICOVAGINAL FISTULA CLOSURE W/ TAH  1988  . VIDEO BRONCHOSCOPY Bilateral 08/23/2016   Procedure: VIDEO BRONCHOSCOPY  WITH FLUORO;  Surgeon: Javier Glazier, MD;  Location: [facility];  Service: Cardiopulmonary;  Laterality: Bilateral;  . VIDEO BRONCHOSCOPY N/A 04/05/2020   Procedure: VIDEO BRONCHOSCOPY WITHOUT FLUORO;  Surgeon: Freddi Starr, MD;  Location: [facility];  Service: Pulmonary;  Laterality: N/A;    Review of systems negative except as noted in HPI / PMHx or noted below:  Review of Systems  Constitutional: Negative.   HENT: Negative.   Eyes: Negative.   Respiratory: Negative.   Cardiovascular: Negative.   Gastrointestinal: Negative.   Genitourinary: Negative.   Musculoskeletal: Negative.   Skin: Negative.   Neurological: Negative.   Endo/Heme/Allergies: Negative.   Psychiatric/Behavioral: Negative.      Objective:   Vitals:   04/26/20 1339  BP: 140/90  Pulse: 83  Resp: 16  Temp: (!) 97.5 F (36.4 C)  SpO2: 94%   Height: 5\' 1"  (154.9 cm)  Weight: 141 lb 3.2 oz (64 kg)   Physical Exam Constitutional:      Appearance: She is not diaphoretic.     Comments: Raspy whisper voice, cough  HENT:     Head: Normocephalic.     Right Ear: Tympanic membrane, ear canal and external ear normal.     Left Ear: Tympanic membrane, ear canal and external ear normal.     Nose: Nose normal. No mucosal edema or rhinorrhea.     Mouth/Throat:     Mouth: Oropharynx is clear and moist and mucous membranes are normal.     Pharynx: Uvula midline. No oropharyngeal exudate.  Eyes:  Conjunctiva/sclera: Conjunctivae normal.  Neck:     Thyroid: No thyromegaly.     Trachea: Trachea normal. No tracheal tenderness or tracheal deviation.  Cardiovascular:     Rate and Rhythm: Normal rate and regular rhythm.     Heart sounds: Normal heart sounds, S1 normal and S2 normal. No murmur heard.   Pulmonary:     Effort: No respiratory distress.     Breath sounds: Normal breath sounds. No stridor. No wheezing or rales.  Musculoskeletal:        General: No edema.  Lymphadenopathy:     Head:      Right side of head: No tonsillar adenopathy.     Left side of head: No tonsillar adenopathy.     Cervical: No cervical adenopathy.  Skin:    Findings: No erythema or rash.     Nails: There is no clubbing.  Neurological:     Mental Status: She is alert.     Diagnostics: Microbiology report from 05 April 2020 obtained during bronchoscopy identifies Candida growth.  Assessment and Plan:   1. Laryngitis   2. Facial pain syndrome   3. Other allergic rhinitis   4. Sleep dysfunction with sleep stage disturbance   5. LPRD (laryngopharyngeal reflux disease)   6. Not well controlled severe persistent asthma     1. 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 Periactin 4 mg tablet  2 tablets at bedtime  2. Continue to Treat laryngopharyngeal reflux:    A. Dexilant 60 mg in the morning  B. famotidine 40 mg daily  4.  Use Astelin and Zyrtec and nasal saline and nasal bactroban if needed  5.  Continue therapy directed by pulmonology and rheumatology  6.  Revisit with Dr. Wilburn Cornelia about chronic laryngitis of 2 months  7.  May require treatment for Candida laryngitis:  Discuss with Dr. Erin Fulling on Friday  8.  Return to clinic in 6 months or earlier if problem   Madelena appears to be doing well regarding her facial pain syndrome and her sleep dysfunction and it sounds as though her reflux is under pretty good control.  Her asthma is handled by and she recently had a bronchoscopy performed in investigation of this issue which identified some Candida growth.  I wonder if her persistent laryngitis over the course of the past few months is secondary to Candida laryngitis or may be vocal cord myopathy from her inhaled steroid or may be a neurological issue with paralysis of her vocal cord.  I have asked her to address this issue with Dr. Erin Fulling who she will see on Friday.  I cannot find the results of her recent bronchoscopy that probably evaluated her vocal cords during descent  into her lungs.  I will see her back in this clinic in 6 months or earlier if there is a problem.  Allena Katz, MD Allergy / Immunology [place]

## 2020-04-27 ENCOUNTER — Encounter: Payer: Self-pay | Admitting: Allergy and Immunology

## 2020-04-29 ENCOUNTER — Ambulatory Visit: Payer: Medicare PPO | Admitting: Pulmonary Disease

## 2020-04-29 ENCOUNTER — Encounter: Payer: Self-pay | Admitting: Pulmonary Disease

## 2020-04-29 ENCOUNTER — Telehealth: Payer: Self-pay | Admitting: Family Medicine

## 2020-04-29 ENCOUNTER — Other Ambulatory Visit: Payer: Self-pay

## 2020-04-29 VITALS — BP 136/80 | HR 94 | Ht 61.0 in | Wt 140.6 lb

## 2020-04-29 DIAGNOSIS — M06049 Rheumatoid arthritis without rheumatoid factor, unspecified hand: Secondary | ICD-10-CM | POA: Diagnosis not present

## 2020-04-29 DIAGNOSIS — R918 Other nonspecific abnormal finding of lung field: Secondary | ICD-10-CM | POA: Diagnosis not present

## 2020-04-29 DIAGNOSIS — Z5181 Encounter for therapeutic drug level monitoring: Secondary | ICD-10-CM

## 2020-04-29 DIAGNOSIS — R059 Cough, unspecified: Secondary | ICD-10-CM

## 2020-04-29 DIAGNOSIS — J479 Bronchiectasis, uncomplicated: Secondary | ICD-10-CM

## 2020-04-29 LAB — APTT: aPTT: 24.1 s (ref 23.4–32.7)

## 2020-04-29 LAB — COMPREHENSIVE METABOLIC PANEL
ALT: 61 U/L — ABNORMAL HIGH (ref 0–35)
AST: 40 U/L — ABNORMAL HIGH (ref 0–37)
Albumin: 3.6 g/dL (ref 3.5–5.2)
Alkaline Phosphatase: 60 U/L (ref 39–117)
BUN: 39 mg/dL — ABNORMAL HIGH (ref 6–23)
CO2: 28 mEq/L (ref 19–32)
Calcium: 9.2 mg/dL (ref 8.4–10.5)
Chloride: 100 mEq/L (ref 96–112)
Creatinine, Ser: 1.47 mg/dL — ABNORMAL HIGH (ref 0.40–1.20)
GFR: 35.53 mL/min — ABNORMAL LOW (ref >60.00)
Glucose, Bld: 147 mg/dL — ABNORMAL HIGH (ref 70–99)
Potassium: 4.4 mEq/L (ref 3.5–5.1)
Sodium: 139 mEq/L (ref 135–145)
Total Bilirubin: 0.4 mg/dL (ref 0.2–1.2)
Total Protein: 6.5 g/dL (ref 6.0–8.3)

## 2020-04-29 LAB — CBC WITH DIFFERENTIAL/PLATELET
Basophils Absolute: 0.1 10*3/uL (ref 0.0–0.1)
Basophils Relative: 0.8 % (ref 0.0–3.0)
Eosinophils Absolute: 0.1 10*3/uL (ref 0.0–0.7)
Eosinophils Relative: 1 % (ref 0.0–5.0)
HCT: 33.9 % — ABNORMAL LOW (ref 36.0–46.0)
Hemoglobin: 10.9 g/dL — ABNORMAL LOW (ref 12.0–15.0)
Lymphocytes Relative: 6.4 % — ABNORMAL LOW (ref 12.0–46.0)
Lymphs Abs: 0.6 10*3/uL — ABNORMAL LOW (ref 0.7–4.0)
MCHC: 32.1 g/dL (ref 30.0–36.0)
MCV: 108.7 fl — ABNORMAL HIGH (ref 78.0–100.0)
Monocytes Absolute: 1 10*3/uL (ref 0.1–1.0)
Monocytes Relative: 9.9 % (ref 3.0–12.0)
Neutro Abs: 8.2 10*3/uL — ABNORMAL HIGH (ref 1.4–7.7)
Neutrophils Relative %: 81.9 % — ABNORMAL HIGH (ref 43.0–77.0)
Platelets: 214 10*3/uL (ref 150.0–400.0)
RBC: 3.12 Mil/uL — ABNORMAL LOW (ref 3.87–5.11)
RDW: 17.1 % — ABNORMAL HIGH (ref 11.5–15.5)
WBC: 10.1 10*3/uL (ref 4.0–10.5)

## 2020-04-29 LAB — PROTIME-INR
INR: 0.9 ratio (ref 0.8–1.0)
Prothrombin Time: 10.5 s (ref 9.6–13.1)

## 2020-04-29 MED ORDER — STIOLTO RESPIMAT 2.5-2.5 MCG/ACT IN AERS: 2.0000 | INHALATION_SPRAY | Freq: Every day | RESPIRATORY_TRACT | 3 refills | Status: DC

## 2020-04-29 NOTE — Progress Notes (Signed)
Synopsis: Referred in 2011 for asthma by Ebony Frizzle, MD.  Previously a patient of Drs. Sunnie Nielsen, McQuaid and [place].  Subjective:   PATIENT ID: Ebony Scott GENDER: female DOB: [birth year], MRN: [id number]   HPI  Chief Complaint  Patient presents with  . Follow-up    Recent Bronch. Reports being a little more short of breath than her normal. Using nebulizer twice daily. She can't really tell if it is helping.    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   She underwent bronchoscopy on 04/05/20 and cultures remain unrevealing at this time. The BAL samples have grown candida albicans and we discussed that we typically do not treat this finding as it does not usually cause pathogenic disease.   She continues to experience hoarse voice and cough along with shortness of breath. She denies weight loss, night sweats, loss of appetite.   OV 04/01/20: CT scan 03/25/2020 showed multifocal areas of nodularity and consolidative changes. Findings consistent with atypical infection or possible malignancy. She was started on azithromycin and cefpodoxime by Dr. Dennard Schaumann. She has completed the azithromycin and she started the cefpodoxime on 2/7. She was treated with levaquin on 03/21/20 for 7 days initially until the CT scan resulted. Blood cultures were negative on 02/23/20 when she presented with AMS.   She reports she is coughing up brown sputum. At baseline she doe snot cough up sputum or it is clear. The sputum amount has declined she taking antibiotics. She denies fevers, chills or sweats. Her appetite is well.    OV 09/01/19: Ebony Scott is a 70 y/o woman who presents for follow up of bronchiectasis, tracheobronchomalacia, immunosuppression, chronic AC for recurrent VTE. She  started prescribed levofloxacin last evening for Pseudomonas bronchiectasis AE. So far she has not had nausea associated with taking this med, but took zofran prophylacticaly. She remains SOB with no change so far in her symptoms. She still coughs frequently despite using her inhaler and hypertonic saline nebs + flutter as prescribed. She has follow up with her PCP soon for hyperglycemiaa. She has follow up with her rheumatologist in August and is hoping that her prednisone will be able to be decreased soon.  Past Medical History:  Diagnosis Date  . Acute deep vein thrombosis (DVT) of right lower extremity ([place]) 12/13/2017  . Allergic rhinitis   . Allergy    SEASONAL  . Anemia   . Angio-edema   . Anxiety    pt denies  . Arthritis    Phreesia 10/21/2019  . Asthma   . Asthma    Phreesia 10/21/2019  . Breast cancer ([place]) 1998   in remission  . Cataract    REMOVED  . Clostridium difficile colitis 01/14/2018  . Clostridium difficile diarrhea 01/14/2018  . Complication of anesthesia    "had hard time waking up from it several times" (02/20/2012)  . Depression    "some; don't take anything for it" (02/20/2012)  . Diverticulosis   . DVT (deep venous thrombosis) ([place])   . Exertional dyspnea   . Fibromyalgia 11/2011  . GERD (gastroesophageal reflux disease)   . Graves disease   . Headache(784.0)    "related to allergies; more at different times during the year" (02/20/2012)  . Hemorrhoids   . Hiatal hernia  back and neck  . Hx of adenomatous colonic polyps 04/12/2016  . Hypercholesteremia    good cholesterol is high  . Hypothyroidism   . IBS (irritable bowel syndrome)   . Moderate persistent asthma    -FeV1 72% 2011, -IgE 102 2011, CT sinus Neg 2011  . Osteoporosis    on reclast yearly  . Peripheral vascular disease ([place]) 2019   DVTs  . Pneumonia 04/2011; ~ 11/2011   "double; single" (02/20/2012)  . Recurrent upper respiratory infection (URI)   . Seronegative rheumatoid arthritis  ([place])    Dr. Lahoma Rocker  . SIRS (systemic inflammatory response syndrome) ([place]) 02/10/2018  . Tracheobronchomalacia      Family History  Problem Relation Age of Onset  . Allergies Mother   . Heart disease Mother   . Arthritis Mother   . Lung cancer Mother   . Diabetes Mother   . Allergies Father   . Heart disease Father   . Arthritis Father   . Stroke Father   . Colon cancer Other        Maternal half aunt/Maternal half uncle  . Colitis Daughter   . Diabetes Maternal Grandfather      Social History   Socioeconomic History  . Marital status: Divorced    Spouse name: Not on file  . Number of children: 2  . Years of education: college  . Highest education level: Not on file  Occupational History  . Occupation: Disabled    Comment: Retired Engineer, production: RETIRED  Tobacco Use  . Smoking status: Passive Smoke Exposure - Never Smoker  . Smokeless tobacco: Never Used  . Tobacco comment: Parents  Vaping Use  . Vaping Use: Never used  Substance and Sexual Activity  . Alcohol use: Not Currently    Alcohol/week: 0.0 standard drinks  . Drug use: No  . Sexual activity: Never  Other Topics Concern  . Not on file  Social History Narrative   Patient lives at home alone. Patient  divorced.    Patient has her BS degree.   Right handed.   Caffeine- sometimes coffee.      [facility]:   Born in [place], [place]. She worked as a Copywriter, advertising. She has no pets currently. She does have indoor plants. Previously had mold in her home that was remediated. Carpet was removed.          Social Determinants of Health   Financial Resource Strain: Not on file  Food Insecurity: Not on file  Transportation Needs: Not on file  Physical Activity: Not on file  Stress: Not on file  Social Connections: Not on file  Intimate Partner Violence: Not on file     Allergies  Allergen Reactions  . Dust Mite Extract Shortness Of Breath and Other (See  Comments)    "sneezing" (02/20/2012)  . Molds & Smuts Shortness Of Breath  . Morphine And Related Hives and Itching  . Other Shortness Of Breath and Other (See Comments)    Grass and weeds "sneezing; filled sinuses" (02/20/2012)  . Penicillins Rash and Other (See Comments)    "welts" (02/20/2012) Has patient had a PCN reaction causing immediate rash, facial/tongue/throat swelling, SOB or lightheadedness with hypotension: Unknown Has patient had a PCN reaction causing severe rash involving mucus membranes or skin necrosis: No Has patient had a PCN reaction that required hospitalization: No Has patient had a PCN reaction occurring within the last 10 years: No If all of the  above answers are "NO", then may proceed with Cephalosporin use. Tolerated Ancef 02/16/20.   Marland Kitchen Reclast [Zoledronic Acid] Other (See Comments)    Fever, Put in hospital, dr said it was a reaction from a reaction   . Rofecoxib Swelling    Vioxx REACTION: feet swelling  . Shellfish Allergy Anaphylaxis, Shortness Of Breath, Itching and Rash  . Tetracycline Hcl Nausea And Vomiting  . Xolair [Omalizumab] Other (See Comments)    Caused Blood clot  . Dilaudid [Hydromorphone Hcl] Itching  . Hydrocodone-Acetaminophen Nausea And Vomiting  . Levofloxacin Other (See Comments)    GI upset  . Oxycodone Hcl Nausea And Vomiting  . Paroxetine Nausea And Vomiting    Paxil   . Baclofen Other (See Comments)    Altered mental status requiring 3 day hospital stay   . Celecoxib Swelling    Feet swelling  . Diltiazem Swelling  . Lactose Intolerance (Gi) Other (See Comments)    Bloating and gas  . Tree Extract Other (See Comments)    "tested and told I was allergic to it; never experienced a reaction to it" (02/20/2012)     Outpatient Medications Prior to Visit  Medication Sig Dispense Refill  . acetaminophen (TYLENOL) 650 MG CR tablet Take 1,300 mg by mouth every 8 (eight) hours as needed for pain.    Marland Kitchen albuterol (PROVENTIL) (2.5  MG/3ML) 0.083% nebulizer solution Take 3 mLs (2.5 mg total) by nebulization in the morning and at bedtime. Along with flutter valve 180 mL 5  . allopurinol (ZYLOPRIM) 100 MG tablet Take 100 mg by mouth daily.    . Alpha-D-Galactosidase (BEANO PO) Take 2-3 capsules by mouth 3 (three) times daily with meals.    . Alpha-Lipoic Acid 600 MG CAPS Take 600 mg by mouth daily.     Marland Kitchen ammonium lactate (AMLACTIN) 12 % cream Apply topically as needed for dry skin. (Patient taking differently: Apply 1 g topically at bedtime.) 385 g 2  . atorvastatin (LIPITOR) 10 MG tablet TAKE 1 TABLET BY MOUTH DAILY 90 tablet 3  . azelastine (ASTELIN) 0.1 % nasal spray USE 2 SPRAYS IN EACH NOSTRIL DAILY AS DIRECTED (Patient taking differently: Place 2 sprays into both nostrils daily as needed for allergies.) 30 mL 5  . benzonatate (TESSALON) 200 MG capsule Take 1 capsule (200 mg total) by mouth 2 (two) times daily as needed for cough. 20 capsule 0  . Calcium Citrate (CITRACAL PO) Take 1 tablet by mouth in the morning and at bedtime.    Marland Kitchen CALCIUM-MAGNESIUM PO Take 1 tablet by mouth 2 (two) times daily.    . cetirizine (ZYRTEC) 10 MG tablet Take 10 mg by mouth daily.    . cholecalciferol (VITAMIN D) 25 MCG (1000 UT) tablet Take 1,000 Units by mouth daily.     . cyproheptadine (PERIACTIN) 4 MG tablet TAKE 2 TABLETS BY MOUTH EVERY EVENING (Patient taking differently: Take 8 mg by mouth every evening.) 180 tablet 2  . denosumab (PROLIA) 60 MG/ML SOLN injection Inject 60 mg into the skin every 6 (six) months. Administer in upper arm, thigh, or abdomen    . DEXILANT 60 MG capsule TAKE 1 CAPSULE BY MOUTH EACH MORNING (Patient taking differently: Take 60 mg by mouth daily.) 90 capsule 3  . diclofenac Sodium (VOLTAREN) 1 % GEL APPLY 2 GRAMS TOPICALLY TWICE DAILY (Patient taking differently: Apply 1 application topically 4 (four) times daily as needed (joint pain).) 350 g 3  . EPINEPHrine (EPIPEN 2-PAK) 0.3 mg/0.3 mL IJ  SOAJ injection  Inject 0.3 mLs (0.3 mg total) into the muscle Once PRN. (Patient taking differently: Inject 0.3 mg into the muscle as needed for anaphylaxis.) 0.3 mL 2  . famotidine (PEPCID) 20 MG tablet TAKE 1 TABLET BY MOUTH TWICE A DAY (Patient taking differently: Take 20 mg by mouth 2 (two) times daily.) 60 tablet 5  . fluticasone (FLONASE) 50 MCG/ACT nasal spray Place 2 sprays into both nostrils daily. 16 g 2  . folic acid (FOLVITE) 1 MG tablet Take 1 mg by mouth daily.    . furosemide (LASIX) 20 MG tablet Take 1 tablet (20 mg total) by mouth every evening. Take 40 mg in the AM, and take additional 20 mg in the PM for swelling (Patient taking differently: Take 20 mg by mouth 2 (two) times daily. Take 40 mg in the AM, and take additional 20 mg in the PM for swelling) 30 tablet 3  . furosemide (LASIX) 40 MG tablet Take 1 tablet (40 mg total) by mouth daily as needed. 30 tablet 3  . gabapentin (NEURONTIN) 600 MG tablet TAKE 1 TABLET BY MOUTH DAILY (Patient taking differently: Take 600 mg by mouth at bedtime.) 90 tablet 3  . guaiFENesin (MUCINEX) 600 MG 12 hr tablet Take 600 mg by mouth 2 (two) times daily.    . hydrocortisone 2.5 % cream Apply topically 2 (two) times daily. (Patient taking differently: Apply topically 2 (two) times daily as needed (hemorrhoids).) 30 g 0  . Lactase 9000 units TABS Take 18,000-27,000 Units by mouth 4 (four) times daily as needed (dairy consumption).    Marland Kitchen leflunomide (ARAVA) 20 MG tablet Take 20 mg by mouth daily.    Marland Kitchen lidocaine (LIDODERM) 5 % Place 1 patch onto the skin daily as needed (pain). Remove & Discard patch within 12 hours or as directed by MD    . magnesium oxide (MAG-OX) 400 MG tablet Take 400 mg by mouth daily.    . mirtazapine (REMERON SOL-TAB) 30 MG disintegrating tablet DISSOLVE 1 TABLET BY MOUTH EVERY NIGHT AT BEDTIME (Patient taking differently: Take 30 mg by mouth at bedtime.) 90 tablet 1  . MITIGARE 0.6 MG CAPS Take 0.6 mg by mouth every other day.    . montelukast  (SINGULAIR) 10 MG tablet TAKE 1 TABLET BY MOUTH DAILY (Patient taking differently: Take 10 mg by mouth every evening.) 30 tablet 5  . mupirocin cream (BACTROBAN) 2 % Apply 1 application topically 2 (two) times daily. 15 g 0  . Nystatin (GERHARDT'S BUTT CREAM) CREA Apply 1 application topically daily as needed (hemorrhoids). NYSTATIN 100,000 UNIT/GM CR 100000 CRM- 60 g, ZINC OXIDE 20 % OINT 20 OINT- 60 g, HYDROCORTISONE 1 % CREAM- 60 g 1 each 11  . nystatin (MYCOSTATIN) 100000 UNIT/ML suspension SWISH AND SPIT 5 MLS TWICE DAILY (Patient taking differently: Use as directed 5 mLs in the mouth or throat at bedtime.) 473 mL 3  . nystatin cream (MYCOSTATIN) Apply 1 application topically 2 (two) times daily.    Marland Kitchen nystatin-triamcinolone ointment (MYCOLOG) Apply topically 2 (two) times daily. (Patient taking differently: Apply 1 application topically 2 (two) times daily as needed (yeast infections).) 90 g 1  . ondansetron (ZOFRAN) 4 MG tablet Take 1 tablet (4 mg total) by mouth every 8 (eight) hours as needed for nausea or vomiting. 10 tablet 0  . predniSONE (DELTASONE) 5 MG tablet Take 15 mg by mouth daily with breakfast.    . Prenatal Vit-Fe Fumarate-FA (PRENATAL MULTIVITAMIN) TABS tablet Take 1 tablet  by mouth daily.    . Probiotic Product (ALIGN PO) Take 1 capsule by mouth daily.    Marland Kitchen Propylene Glycol (SYSTANE COMPLETE) 0.6 % SOLN Place 1 drop into both eyes 2 (two) times daily as needed (dry eyes).    Marland Kitchen Respiratory Therapy Supplies (FLUTTER) DEVI Use as directed 1 each 0  . Salicylic Acid-Urea (KERASAL EX) Apply 1 application topically at bedtime.    . sodium chloride HYPERTONIC 3 % nebulizer solution Take by nebulization 2 (two) times daily. 750 mL 12  . Spacer/Aero-Holding Chambers (AEROCHAMBER PLUS) inhaler Use as instructed 1 each 0  . XARELTO 20 MG TABS tablet TAKE 1 TABLET BY MOUTH DAILY WITH SUPPER (Patient taking differently: Take 20 mg by mouth every evening.) 90 tablet 3  . DULERA 200-5 MCG/ACT  AERO INHALE 2 PUFFS BY MOUTH TWICE DAILY (Patient taking differently: Inhale 2 puffs into the lungs 2 (two) times daily.) 39 g 1  . Tiotropium Bromide Monohydrate (SPIRIVA RESPIMAT) 2.5 MCG/ACT AERS Inhale 1 puff into the lungs daily. 4 g 11   Facility-Administered Medications Prior to Visit  Medication Dose Route Frequency Provider Last Rate Last Admin  . denosumab (PROLIA) injection 60 mg  60 mg Subcutaneous Q6 months Ebony Frizzle, MD   60 mg at 03/31/20 1224    Review of Systems  Constitutional: Negative for chills, fever, malaise/fatigue and weight loss.  HENT: Negative for congestion, sinus pain and sore throat.   Eyes: Negative.   Respiratory: Positive for cough, sputum production and wheezing. Negative for hemoptysis and shortness of breath.   Cardiovascular: Negative for chest pain, palpitations, orthopnea, claudication and leg swelling.  Gastrointestinal: Negative for abdominal pain, heartburn, nausea and vomiting.  Genitourinary: Negative.   Musculoskeletal: Negative for joint pain and myalgias.  Skin: Negative for rash.  Neurological: Negative for weakness.  Endo/Heme/Allergies: Negative.   Psychiatric/Behavioral: Negative.     Objective:   Vitals:   04/29/20 1143  BP: 136/80  Pulse: 94  SpO2: 95%  Weight: 140 lb 9.6 oz (63.8 kg)     Physical Exam Constitutional:      General: She is not in acute distress.    Appearance: She is not ill-appearing.  HENT:     Head: Normocephalic and atraumatic.  Eyes:     General: No scleral icterus.    Conjunctiva/sclera: Conjunctivae normal.     Pupils: Pupils are equal, round, and reactive to light.  Cardiovascular:     Rate and Rhythm: Normal rate and regular rhythm.     Pulses: Normal pulses.     Heart sounds: Normal heart sounds. No murmur heard.   Pulmonary:     Effort: Pulmonary effort is normal.     Breath sounds: No wheezing, rhonchi or rales.     Comments: Course breath sounds Abdominal:     General:  Bowel sounds are normal.     Palpations: Abdomen is soft.  Musculoskeletal:     Right lower leg: No edema.     Left lower leg: No edema.  Lymphadenopathy:     Cervical: No cervical adenopathy.  Skin:    General: Skin is warm and dry.  Neurological:     General: No focal deficit present.     Mental Status: She is alert.  Psychiatric:        Mood and Affect: Mood normal.        Behavior: Behavior normal.        Thought Content: Thought content normal.  Judgment: Judgment normal.    CBC    Component Value Date/Time   WBC 14.7 (H) 03/31/2020 1215   RBC 2.73 (L) 03/31/2020 1215   HGB 9.5 (L) 03/31/2020 1215   HCT 28.8 (L) 03/31/2020 1215   PLT 251 03/31/2020 1215   MCV 105.5 (H) 03/31/2020 1215   MCH 34.8 (H) 03/31/2020 1215   MCHC 33.0 03/31/2020 1215   RDW 15.2 (H) 03/31/2020 1215   LYMPHSABS 456 (L) 03/31/2020 1215   MONOABS 1.0 02/23/2020 2326   EOSABS 15 03/31/2020 1215   BASOSABS 44 03/31/2020 1215   BMP Latest Ref Rng & Units 03/31/2020 03/21/2020 02/28/2020  Glucose 65 - 99 mg/dL 118(H) 249(H) 95  BUN 7 - 25 mg/dL 31(H) 32(H) 14  Creatinine 0.60 - 0.93 mg/dL 1.19(H) 1.34(H) 1.12(H)  BUN/Creat Ratio 6 - 22 (calc) 26(H) 24(H) -  Sodium 135 - 146 mmol/L 140 141 143  Potassium 3.5 - 5.3 mmol/L 4.1 4.2 4.3  Chloride 98 - 110 mmol/L 99 100 114(H)  CO2 20 - 32 mmol/L 28 25 19(L)  Calcium 8.6 - 10.4 mg/dL 9.2 8.4(L) 8.5(L)   Chest imaging: Chest Imaging- films reviewed: CT chest wo contrast 03/25/2020 - multifocal areas of nodularity and consolidative changes. Findings consistent with atypical infection or possible malignancy.  CT high-resolution 10/24/2018-mild bronchiectasis right middle lobe, lingula, right lower lobe, but does taper more appropriately distally.  Persistent nodules in right middle lobe and lingula with mild tree-in-bud opacities.  No inter or intralobular septal thickening or significant groundglass.  Expiratory images with some mosaicism and  significant bronchial and tracheal collapse, especially on the right.   HRCT chest 08/30/19: RUL TIB opacities, multilobar bronchiectasis. Tracheobronchomalacia progressed since previous CT scan. Subacute rib fractures- healing. CAD.  PFT: PFT Results Latest Ref Rng & Units 08/20/2019 09/18/2018 06/19/2017 08/08/2015 04/29/2015  FVC-Pre L 1.72 1.74 2.00 2.08 2.24  FVC-Predicted Pre % 66 66 74 75 81  FVC-Post L 1.65 1.61 1.86 2.17 2.25  FVC-Predicted Post % 63 61 69 78 81  Pre FEV1/FVC % % 90 88 90 83 87  Post FEV1/FCV % % 90 91 93 87 84  FEV1-Pre L 1.55 1.53 1.80 1.73 1.95  FEV1-Predicted Pre % 79 76 88 82 92  FEV1-Post L 1.49 1.47 1.72 1.89 1.89  DLCO uncorrected ml/min/mmHg 11.33 13.51 13.68 12.36 12.99  DLCO UNC% % 64 77 66 60 63  DLCO corrected ml/min/mmHg 11.33 - - 13.76 12.87  DLCO COR %Predicted % 64 - - 66 62  DLVA Predicted % 83 92 85 94 81  TLC L 3.71 3.52 3.95 - 3.91  TLC % Predicted % 80 76 85 - 84  RV % Predicted % 76 79 86 - 83   2020-no significant obstruction, mild restriction, mild DLCO reduction.  Flow volume loops consistent with restriction.  2021-obstruction or bronchodilator reversibility.  No significant restriction.  Mild diffusion impairment.  Compared to 2020, mild improvement.  Previous respiratory cultures- all fungal and AFB negative from right middle lobe and lingula during bronchoscopy 08/2016, only positive for normal flora   Pathology 08/23/2016: BAL lingula-no AFB or fungus  Echocardiogram: Unable to review report from 02/2018.  08/05/2017 demonstrates LVEF 65 to 88%, grade 1 diastolic dysfunction.  Normal LA, RV, RA.  Trivial TR and MR, otherwise normal valves.  Echocardiogram 07/10/2019-LVEF greater than 75% with hyperdynamic function, moderate LVH with grade 1 diastolic dysfunction.  Normal LA, RV, RA.  Trivial AR, otherwise normal valves.  Allergy panel 04/02/2016-increased IgE for  Aspergillus, otherwise negative Alpha-1 antitrypsin  170  Bronchoscopy 12/24/2018 (performed at [facility]) Neutrophils 12% Lymphocytes 1% Macrophages 79% Respiratory viral panel negative Unable to see PJP DNA Respiratory culture-1+ mixed gram-negative rods, normal flora Fungus culture negative AFB negative  08/25/19 sputum- Pan-sensitive Pseudomonas  Assessment & Plan:   Rheumatoid arthritis involving hand with negative rheumatoid factor, unspecified laterality ([place]) - Plan: ANCA screen with reflex titer, CANCELED: CBC w/Diff  Therapeutic drug monitoring - Plan: Protime-INR, Comprehensive metabolic panel, CBC with Differential/Platelet, APTT  Cough - Plan: Comprehensive metabolic panel, CBC with Differential/Platelet  Multiple lung nodules on CT - Plan: Comprehensive metabolic panel, CBC with Differential/Platelet  Bronchiectasis without complication ([facility]) - Plan: Tiotropium Bromide-Olodaterol (STIOLTO RESPIMAT) 2.5-2.5 MCG/ACT AERS  Discussion: Ebony Scott is a 70 year old woman, never smoker with asthma, tracheobronchomalacia on CPAP with oxygen, bronchiectasis and rheumatoid arthritis on leflunamide who returns to pulmonary clinic for evaluation of pneumonia and abnormal CT chest scan after her recent surgery.   Her BAL cultures are unrevealing at this time and she is overall clinically stable compared to the last visit. I still feel based on the CT findings that we are dealing with more of an infectious agent based on her history of dysphagia and also possible aspiration during her recent surgery at the end of last year.   We will repeat a CT Chest scan in 1 month for monitoring purposes.   She has ongoing cough and wheezing which could be attributed to her tracheobronchomalacia.   We will hold her dulera and spiriva and switch her to stiolto to avoid inhaled corticosteroids and monitor if this helps with her hoarseness of voice. She has follow up with Dr. Wilburn Cornelia of ENT soon.   Follow up in 6 weeks.  Freda Jackson,  MD [facility] Office: [phone]   Current Outpatient Medications:  .  acetaminophen (TYLENOL) 650 MG CR tablet, Take 1,300 mg by mouth every 8 (eight) hours as needed for pain., Disp: , Rfl:  .  albuterol (PROVENTIL) (2.5 MG/3ML) 0.083% nebulizer solution, Take 3 mLs (2.5 mg total) by nebulization in the morning and at bedtime. Along with flutter valve, Disp: 180 mL, Rfl: 5 .  allopurinol (ZYLOPRIM) 100 MG tablet, Take 100 mg by mouth daily., Disp: , Rfl:  .  Alpha-D-Galactosidase (BEANO PO), Take 2-3 capsules by mouth 3 (three) times daily with meals., Disp: , Rfl:  .  Alpha-Lipoic Acid 600 MG CAPS, Take 600 mg by mouth daily. , Disp: , Rfl:  .  ammonium lactate (AMLACTIN) 12 % cream, Apply topically as needed for dry skin. (Patient taking differently: Apply 1 g topically at bedtime.), Disp: 385 g, Rfl: 2 .  atorvastatin (LIPITOR) 10 MG tablet, TAKE 1 TABLET BY MOUTH DAILY, Disp: 90 tablet, Rfl: 3 .  azelastine (ASTELIN) 0.1 % nasal spray, USE 2 SPRAYS IN EACH NOSTRIL DAILY AS DIRECTED (Patient taking differently: Place 2 sprays into both nostrils daily as needed for allergies.), Disp: 30 mL, Rfl: 5 .  benzonatate (TESSALON) 200 MG capsule, Take 1 capsule (200 mg total) by mouth 2 (two) times daily as needed for cough., Disp: 20 capsule, Rfl: 0 .  Calcium Citrate (CITRACAL PO), Take 1 tablet by mouth in the morning and at bedtime., Disp: , Rfl:  .  CALCIUM-MAGNESIUM PO, Take 1 tablet by mouth 2 (two) times daily., Disp: , Rfl:  .  cetirizine (ZYRTEC) 10 MG tablet, Take 10 mg by mouth daily., Disp: , Rfl:  .  cholecalciferol (VITAMIN D) 25 MCG (1000 UT) tablet, Take 1,000 Units by mouth daily. , Disp: , Rfl:  .  cyproheptadine (PERIACTIN) 4 MG tablet, TAKE 2 TABLETS BY MOUTH EVERY EVENING (Patient taking differently: Take 8 mg by mouth every evening.), Disp: 180 tablet, Rfl: 2 .  denosumab (PROLIA) 60 MG/ML SOLN injection, Inject 60 mg into the skin every 6 (six) months.  Administer in upper arm, thigh, or abdomen, Disp: , Rfl:  .  DEXILANT 60 MG capsule, TAKE 1 CAPSULE BY MOUTH EACH MORNING (Patient taking differently: Take 60 mg by mouth daily.), Disp: 90 capsule, Rfl: 3 .  diclofenac Sodium (VOLTAREN) 1 % GEL, APPLY 2 GRAMS TOPICALLY TWICE DAILY (Patient taking differently: Apply 1 application topically 4 (four) times daily as needed (joint pain).), Disp: 350 g, Rfl: 3 .  EPINEPHrine (EPIPEN 2-PAK) 0.3 mg/0.3 mL IJ SOAJ injection, Inject 0.3 mLs (0.3 mg total) into the muscle Once PRN. (Patient taking differently: Inject 0.3 mg into the muscle as needed for anaphylaxis.), Disp: 0.3 mL, Rfl: 2 .  famotidine (PEPCID) 20 MG tablet, TAKE 1 TABLET BY MOUTH TWICE A DAY (Patient taking differently: Take 20 mg by mouth 2 (two) times daily.), Disp: 60 tablet, Rfl: 5 .  fluticasone (FLONASE) 50 MCG/ACT nasal spray, Place 2 sprays into both nostrils daily., Disp: 16 g, Rfl: 2 .  folic acid (FOLVITE) 1 MG tablet, Take 1 mg by mouth daily., Disp: , Rfl:  .  furosemide (LASIX) 20 MG tablet, Take 1 tablet (20 mg total) by mouth every evening. Take 40 mg in the AM, and take additional 20 mg in the PM for swelling (Patient taking differently: Take 20 mg by mouth 2 (two) times daily. Take 40 mg in the AM, and take additional 20 mg in the PM for swelling), Disp: 30 tablet, Rfl: 3 .  furosemide (LASIX) 40 MG tablet, Take 1 tablet (40 mg total) by mouth daily as needed., Disp: 30 tablet, Rfl: 3 .  gabapentin (NEURONTIN) 600 MG tablet, TAKE 1 TABLET BY MOUTH DAILY (Patient taking differently: Take 600 mg by mouth at bedtime.), Disp: 90 tablet, Rfl: 3 .  guaiFENesin (MUCINEX) 600 MG 12 hr tablet, Take 600 mg by mouth 2 (two) times daily., Disp: , Rfl:  .  hydrocortisone 2.5 % cream, Apply topically 2 (two) times daily. (Patient taking differently: Apply topically 2 (two) times daily as needed (hemorrhoids).), Disp: 30 g, Rfl: 0 .  Lactase 9000 units TABS, Take 18,000-27,000 Units by mouth 4  (four) times daily as needed (dairy consumption)., Disp: , Rfl:  .  leflunomide (ARAVA) 20 MG tablet, Take 20 mg by mouth daily., Disp: , Rfl:  .  lidocaine (LIDODERM) 5 %, Place 1 patch onto the skin daily as needed (pain). Remove & Discard patch within 12 hours or as directed by MD, Disp: , Rfl:  .  magnesium oxide (MAG-OX) 400 MG tablet, Take 400 mg by mouth daily., Disp: , Rfl:  .  mirtazapine (REMERON SOL-TAB) 30 MG disintegrating tablet, DISSOLVE 1 TABLET BY MOUTH EVERY NIGHT AT BEDTIME (Patient taking differently: Take 30 mg by mouth at bedtime.), Disp: 90 tablet, Rfl: 1 .  MITIGARE 0.6 MG CAPS, Take 0.6 mg by mouth every other day., Disp: , Rfl:  .  montelukast (SINGULAIR) 10 MG tablet, TAKE 1 TABLET BY MOUTH DAILY (Patient taking differently: Take 10 mg by mouth every evening.), Disp: 30 tablet, Rfl: 5 .  mupirocin cream (BACTROBAN) 2 %, Apply 1 application topically 2 (two)  times daily., Disp: 15 g, Rfl: 0 .  Nystatin (GERHARDT'S BUTT CREAM) CREA, Apply 1 application topically daily as needed (hemorrhoids). NYSTATIN 100,000 UNIT/GM CR 100000 CRM- 60 g, ZINC OXIDE 20 % OINT 20 OINT- 60 g, HYDROCORTISONE 1 % CREAM- 60 g, Disp: 1 each, Rfl: 11 .  nystatin (MYCOSTATIN) 100000 UNIT/ML suspension, SWISH AND SPIT 5 MLS TWICE DAILY (Patient taking differently: Use as directed 5 mLs in the mouth or throat at bedtime.), Disp: 473 mL, Rfl: 3 .  nystatin cream (MYCOSTATIN), Apply 1 application topically 2 (two) times daily., Disp: , Rfl:  .  nystatin-triamcinolone ointment (MYCOLOG), Apply topically 2 (two) times daily. (Patient taking differently: Apply 1 application topically 2 (two) times daily as needed (yeast infections).), Disp: 90 g, Rfl: 1 .  ondansetron (ZOFRAN) 4 MG tablet, Take 1 tablet (4 mg total) by mouth every 8 (eight) hours as needed for nausea or vomiting., Disp: 10 tablet, Rfl: 0 .  predniSONE (DELTASONE) 5 MG tablet, Take 15 mg by mouth daily with breakfast., Disp: , Rfl:  .  Prenatal  Vit-Fe Fumarate-FA (PRENATAL MULTIVITAMIN) TABS tablet, Take 1 tablet by mouth daily., Disp: , Rfl:  .  Probiotic Product (ALIGN PO), Take 1 capsule by mouth daily., Disp: , Rfl:  .  Propylene Glycol (SYSTANE COMPLETE) 0.6 % SOLN, Place 1 drop into both eyes 2 (two) times daily as needed (dry eyes)., Disp: , Rfl:  .  Respiratory Therapy Supplies (FLUTTER) DEVI, Use as directed, Disp: 1 each, Rfl: 0 .  Salicylic Acid-Urea (KERASAL EX), Apply 1 application topically at bedtime., Disp: , Rfl:  .  sodium chloride HYPERTONIC 3 % nebulizer solution, Take by nebulization 2 (two) times daily., Disp: 750 mL, Rfl: 12 .  Spacer/Aero-Holding Chambers (AEROCHAMBER PLUS) inhaler, Use as instructed, Disp: 1 each, Rfl: 0 .  Tiotropium Bromide-Olodaterol (STIOLTO RESPIMAT) 2.5-2.5 MCG/ACT AERS, Inhale 2 puffs into the lungs daily., Disp: 4 g, Rfl: 3 .  XARELTO 20 MG TABS tablet, TAKE 1 TABLET BY MOUTH DAILY WITH SUPPER (Patient taking differently: Take 20 mg by mouth every evening.), Disp: 90 tablet, Rfl: 3  Current Facility-Administered Medications:  .  denosumab (PROLIA) injection 60 mg, 60 mg, Subcutaneous, Q6 months, Ebony Frizzle, MD, 60 mg at 03/31/20 1224

## 2020-04-29 NOTE — Patient Instructions (Addendum)
Stop dulera inhaler and spiriva.  Start stiolto 2 puffs daily and continue to use albuterol nebulizer twice daily.

## 2020-04-29 NOTE — Telephone Encounter (Signed)
FYI she states her pulmonary DR. Kozlow drew her CBC blood work today at his visit.  CB# [phone]

## 2020-04-29 NOTE — Telephone Encounter (Signed)
PCP to be made aware.  

## 2020-05-02 ENCOUNTER — Other Ambulatory Visit: Payer: Self-pay | Admitting: Family Medicine

## 2020-05-02 DIAGNOSIS — J471 Bronchiectasis with (acute) exacerbation: Secondary | ICD-10-CM | POA: Diagnosis not present

## 2020-05-02 LAB — ANCA SCREEN W REFLEX TITER
ANCA Screen: POSITIVE — AB
Atypical P-ANCA titer: 1:80 {titer} — ABNORMAL HIGH

## 2020-05-03 ENCOUNTER — Encounter: Payer: Self-pay | Admitting: Family Medicine

## 2020-05-05 ENCOUNTER — Other Ambulatory Visit: Payer: Self-pay | Admitting: Critical Care Medicine

## 2020-05-06 ENCOUNTER — Encounter: Payer: Self-pay | Admitting: Allergy and Immunology

## 2020-05-06 ENCOUNTER — Encounter: Payer: Self-pay | Admitting: Family Medicine

## 2020-05-06 DIAGNOSIS — Z86718 Personal history of other venous thrombosis and embolism: Secondary | ICD-10-CM | POA: Diagnosis not present

## 2020-05-06 DIAGNOSIS — M069 Rheumatoid arthritis, unspecified: Secondary | ICD-10-CM | POA: Diagnosis not present

## 2020-05-06 DIAGNOSIS — Z7901 Long term (current) use of anticoagulants: Secondary | ICD-10-CM | POA: Diagnosis not present

## 2020-05-06 DIAGNOSIS — N1832 Chronic kidney disease, stage 3b: Secondary | ICD-10-CM | POA: Diagnosis not present

## 2020-05-06 DIAGNOSIS — J45909 Unspecified asthma, uncomplicated: Secondary | ICD-10-CM | POA: Diagnosis not present

## 2020-05-06 DIAGNOSIS — J3489 Other specified disorders of nose and nasal sinuses: Secondary | ICD-10-CM | POA: Diagnosis not present

## 2020-05-06 DIAGNOSIS — K219 Gastro-esophageal reflux disease without esophagitis: Secondary | ICD-10-CM | POA: Diagnosis not present

## 2020-05-06 DIAGNOSIS — M109 Gout, unspecified: Secondary | ICD-10-CM | POA: Diagnosis not present

## 2020-05-06 DIAGNOSIS — J387 Other diseases of larynx: Secondary | ICD-10-CM | POA: Diagnosis not present

## 2020-05-06 DIAGNOSIS — B3789 Other sites of candidiasis: Secondary | ICD-10-CM | POA: Diagnosis not present

## 2020-05-06 DIAGNOSIS — D631 Anemia in chronic kidney disease: Secondary | ICD-10-CM | POA: Diagnosis not present

## 2020-05-06 DIAGNOSIS — Z86711 Personal history of pulmonary embolism: Secondary | ICD-10-CM | POA: Diagnosis not present

## 2020-05-09 ENCOUNTER — Telehealth: Payer: Self-pay | Admitting: Family Medicine

## 2020-05-09 ENCOUNTER — Other Ambulatory Visit: Payer: Self-pay | Admitting: Family Medicine

## 2020-05-09 MED ORDER — BENZONATATE 200 MG PO CAPS: 200.0000 mg | ORAL_CAPSULE | Freq: Two times a day (BID) | ORAL | 0 refills | Status: DC | PRN

## 2020-05-09 MED ORDER — HYDROCORTISONE 2.5 % EX CREA: TOPICAL_CREAM | Freq: Two times a day (BID) | CUTANEOUS | 3 refills | Status: DC | PRN

## 2020-05-09 MED ORDER — BENZONATATE 200 MG PO CAPS: 200.0000 mg | ORAL_CAPSULE | Freq: Three times a day (TID) | ORAL | 0 refills | Status: DC | PRN

## 2020-05-09 MED ORDER — GUAIFENESIN ER 600 MG PO TB12: 600.0000 mg | ORAL_TABLET | Freq: Two times a day (BID) | ORAL | 1 refills | Status: DC | PRN

## 2020-05-09 NOTE — Telephone Encounter (Signed)
Pt called needing a refill of  hydrocortisone 2.5 % cream guaiFENesin (MUCINEX) 600 MG 12 hr  Sent to pharmacy

## 2020-05-09 NOTE — Telephone Encounter (Signed)
Prescription sent to pharmacy.

## 2020-05-09 NOTE — Telephone Encounter (Signed)
Dr. Erin Fulling, please see mychart message sent by pt as an FYI.

## 2020-05-10 ENCOUNTER — Telehealth: Payer: Self-pay | Admitting: Pulmonary Disease

## 2020-05-11 LAB — FUNGUS CULTURE RESULT

## 2020-05-11 LAB — FUNGUS CULTURE WITH STAIN

## 2020-05-11 LAB — FUNGAL ORGANISM REFLEX

## 2020-05-11 NOTE — Telephone Encounter (Signed)
Results from bronch were faxed to Dr Kathlene November at the number provided  Nothing further needed

## 2020-05-12 ENCOUNTER — Other Ambulatory Visit (HOSPITAL_BASED_OUTPATIENT_CLINIC_OR_DEPARTMENT_OTHER): Payer: Self-pay

## 2020-05-13 DIAGNOSIS — M25521 Pain in right elbow: Secondary | ICD-10-CM | POA: Diagnosis not present

## 2020-05-13 DIAGNOSIS — M19012 Primary osteoarthritis, left shoulder: Secondary | ICD-10-CM | POA: Diagnosis not present

## 2020-05-13 DIAGNOSIS — M7021 Olecranon bursitis, right elbow: Secondary | ICD-10-CM | POA: Diagnosis not present

## 2020-05-14 DIAGNOSIS — M0600 Rheumatoid arthritis without rheumatoid factor, unspecified site: Secondary | ICD-10-CM | POA: Diagnosis not present

## 2020-05-14 DIAGNOSIS — J471 Bronchiectasis with (acute) exacerbation: Secondary | ICD-10-CM | POA: Diagnosis not present

## 2020-05-14 DIAGNOSIS — J4540 Moderate persistent asthma, uncomplicated: Secondary | ICD-10-CM | POA: Diagnosis not present

## 2020-05-14 DIAGNOSIS — J45909 Unspecified asthma, uncomplicated: Secondary | ICD-10-CM | POA: Diagnosis not present

## 2020-05-14 DIAGNOSIS — G4733 Obstructive sleep apnea (adult) (pediatric): Secondary | ICD-10-CM | POA: Diagnosis not present

## 2020-05-14 DIAGNOSIS — I5032 Chronic diastolic (congestive) heart failure: Secondary | ICD-10-CM | POA: Diagnosis not present

## 2020-05-14 DIAGNOSIS — G4736 Sleep related hypoventilation in conditions classified elsewhere: Secondary | ICD-10-CM | POA: Diagnosis not present

## 2020-05-14 DIAGNOSIS — G894 Chronic pain syndrome: Secondary | ICD-10-CM | POA: Diagnosis not present

## 2020-05-14 DIAGNOSIS — R0602 Shortness of breath: Secondary | ICD-10-CM | POA: Diagnosis not present

## 2020-05-20 DIAGNOSIS — M7021 Olecranon bursitis, right elbow: Secondary | ICD-10-CM | POA: Diagnosis not present

## 2020-05-20 DIAGNOSIS — M19012 Primary osteoarthritis, left shoulder: Secondary | ICD-10-CM | POA: Diagnosis not present

## 2020-05-25 ENCOUNTER — Other Ambulatory Visit: Payer: Medicare PPO

## 2020-05-25 ENCOUNTER — Other Ambulatory Visit: Payer: Self-pay | Admitting: Family Medicine

## 2020-05-25 ENCOUNTER — Ambulatory Visit (INDEPENDENT_AMBULATORY_CARE_PROVIDER_SITE_OTHER)
Admission: RE | Admit: 2020-05-25 | Discharge: 2020-05-25 | Disposition: A | Discharge status: Home / Self Care | Payer: Medicare PPO | Source: Ambulatory Visit | Attending: Pulmonary Disease | Admitting: Pulmonary Disease

## 2020-05-25 ENCOUNTER — Other Ambulatory Visit: Payer: Self-pay

## 2020-05-25 DIAGNOSIS — S92352D Displaced fracture of fifth metatarsal bone, left foot, subsequent encounter for fracture with routine healing: Secondary | ICD-10-CM | POA: Diagnosis not present

## 2020-05-25 DIAGNOSIS — M79672 Pain in left foot: Secondary | ICD-10-CM | POA: Diagnosis not present

## 2020-05-25 DIAGNOSIS — I700 Atherosclerosis of aorta: Secondary | ICD-10-CM | POA: Diagnosis not present

## 2020-05-25 DIAGNOSIS — M79641 Pain in right hand: Secondary | ICD-10-CM | POA: Diagnosis not present

## 2020-05-25 DIAGNOSIS — M19042 Primary osteoarthritis, left hand: Secondary | ICD-10-CM | POA: Diagnosis not present

## 2020-05-25 DIAGNOSIS — Z9889 Other specified postprocedural states: Secondary | ICD-10-CM | POA: Diagnosis not present

## 2020-05-25 DIAGNOSIS — N1831 Chronic kidney disease, stage 3a: Secondary | ICD-10-CM | POA: Diagnosis not present

## 2020-05-25 DIAGNOSIS — M7989 Other specified soft tissue disorders: Secondary | ICD-10-CM | POA: Diagnosis not present

## 2020-05-25 DIAGNOSIS — J439 Emphysema, unspecified: Secondary | ICD-10-CM | POA: Diagnosis not present

## 2020-05-25 DIAGNOSIS — S92351D Displaced fracture of fifth metatarsal bone, right foot, subsequent encounter for fracture with routine healing: Secondary | ICD-10-CM | POA: Diagnosis not present

## 2020-05-25 DIAGNOSIS — M810 Age-related osteoporosis without current pathological fracture: Secondary | ICD-10-CM | POA: Diagnosis not present

## 2020-05-25 DIAGNOSIS — I776 Arteritis, unspecified: Secondary | ICD-10-CM | POA: Diagnosis not present

## 2020-05-25 DIAGNOSIS — R748 Abnormal levels of other serum enzymes: Secondary | ICD-10-CM | POA: Diagnosis not present

## 2020-05-25 DIAGNOSIS — Z7952 Long term (current) use of systemic steroids: Secondary | ICD-10-CM | POA: Diagnosis not present

## 2020-05-25 DIAGNOSIS — I2510 Atherosclerotic heart disease of native coronary artery without angina pectoris: Secondary | ICD-10-CM | POA: Diagnosis not present

## 2020-05-25 DIAGNOSIS — R918 Other nonspecific abnormal finding of lung field: Secondary | ICD-10-CM | POA: Diagnosis not present

## 2020-05-25 DIAGNOSIS — M0609 Rheumatoid arthritis without rheumatoid factor, multiple sites: Secondary | ICD-10-CM | POA: Diagnosis not present

## 2020-05-25 DIAGNOSIS — M79642 Pain in left hand: Secondary | ICD-10-CM | POA: Diagnosis not present

## 2020-05-25 DIAGNOSIS — M19041 Primary osteoarthritis, right hand: Secondary | ICD-10-CM | POA: Diagnosis not present

## 2020-05-25 DIAGNOSIS — Z79899 Other long term (current) drug therapy: Secondary | ICD-10-CM | POA: Diagnosis not present

## 2020-05-25 DIAGNOSIS — M79671 Pain in right foot: Secondary | ICD-10-CM | POA: Diagnosis not present

## 2020-05-25 DIAGNOSIS — S2243XA Multiple fractures of ribs, bilateral, initial encounter for closed fracture: Secondary | ICD-10-CM | POA: Diagnosis not present

## 2020-05-25 DIAGNOSIS — M79643 Pain in unspecified hand: Secondary | ICD-10-CM | POA: Diagnosis not present

## 2020-05-25 DIAGNOSIS — E790 Hyperuricemia without signs of inflammatory arthritis and tophaceous disease: Secondary | ICD-10-CM | POA: Diagnosis not present

## 2020-05-26 ENCOUNTER — Other Ambulatory Visit: Payer: Self-pay | Admitting: Family Medicine

## 2020-05-26 ENCOUNTER — Other Ambulatory Visit: Payer: Self-pay | Admitting: Critical Care Medicine

## 2020-05-26 DIAGNOSIS — B3789 Other sites of candidiasis: Secondary | ICD-10-CM | POA: Diagnosis not present

## 2020-05-26 DIAGNOSIS — J387 Other diseases of larynx: Secondary | ICD-10-CM | POA: Diagnosis not present

## 2020-05-26 DIAGNOSIS — J3489 Other specified disorders of nose and nasal sinuses: Secondary | ICD-10-CM | POA: Diagnosis not present

## 2020-05-31 ENCOUNTER — Other Ambulatory Visit: Payer: Self-pay | Admitting: Critical Care Medicine

## 2020-06-06 DIAGNOSIS — M0609 Rheumatoid arthritis without rheumatoid factor, multiple sites: Secondary | ICD-10-CM | POA: Diagnosis not present

## 2020-06-06 LAB — ACID FAST CULTURE WITH REFLEXED SENSITIVITIES (MYCOBACTERIA)
Acid Fast Culture: NEGATIVE
Acid Fast Culture: NEGATIVE

## 2020-06-07 ENCOUNTER — Other Ambulatory Visit (HOSPITAL_COMMUNITY): Payer: Self-pay

## 2020-06-07 MED ORDER — GERHARDT'S BUTT CREAM
TOPICAL_CREAM | CUTANEOUS | 11 refills | Status: DC
  Filled 2020-06-07: qty 1, 10d supply, fill #0

## 2020-06-14 ENCOUNTER — Other Ambulatory Visit: Payer: Self-pay

## 2020-06-14 ENCOUNTER — Encounter: Payer: Self-pay | Admitting: Pulmonary Disease

## 2020-06-14 ENCOUNTER — Ambulatory Visit: Payer: Medicare PPO | Admitting: Pulmonary Disease

## 2020-06-14 VITALS — BP 132/82 | HR 76 | Temp 97.4°F | Ht 61.0 in | Wt 143.0 lb

## 2020-06-14 DIAGNOSIS — G4733 Obstructive sleep apnea (adult) (pediatric): Secondary | ICD-10-CM | POA: Diagnosis not present

## 2020-06-14 DIAGNOSIS — Z87898 Personal history of other specified conditions: Secondary | ICD-10-CM | POA: Diagnosis not present

## 2020-06-14 DIAGNOSIS — I5032 Chronic diastolic (congestive) heart failure: Secondary | ICD-10-CM | POA: Diagnosis not present

## 2020-06-14 DIAGNOSIS — J479 Bronchiectasis, uncomplicated: Secondary | ICD-10-CM | POA: Diagnosis not present

## 2020-06-14 DIAGNOSIS — J471 Bronchiectasis with (acute) exacerbation: Secondary | ICD-10-CM | POA: Diagnosis not present

## 2020-06-14 DIAGNOSIS — J398 Other specified diseases of upper respiratory tract: Secondary | ICD-10-CM | POA: Diagnosis not present

## 2020-06-14 DIAGNOSIS — M0600 Rheumatoid arthritis without rheumatoid factor, unspecified site: Secondary | ICD-10-CM | POA: Diagnosis not present

## 2020-06-14 DIAGNOSIS — R918 Other nonspecific abnormal finding of lung field: Secondary | ICD-10-CM

## 2020-06-14 DIAGNOSIS — G894 Chronic pain syndrome: Secondary | ICD-10-CM | POA: Diagnosis not present

## 2020-06-14 DIAGNOSIS — J3489 Other specified disorders of nose and nasal sinuses: Secondary | ICD-10-CM | POA: Diagnosis not present

## 2020-06-14 DIAGNOSIS — R0989 Other specified symptoms and signs involving the circulatory and respiratory systems: Secondary | ICD-10-CM | POA: Diagnosis not present

## 2020-06-14 DIAGNOSIS — J4540 Moderate persistent asthma, uncomplicated: Secondary | ICD-10-CM | POA: Diagnosis not present

## 2020-06-14 DIAGNOSIS — G4736 Sleep related hypoventilation in conditions classified elsewhere: Secondary | ICD-10-CM | POA: Diagnosis not present

## 2020-06-14 DIAGNOSIS — J45909 Unspecified asthma, uncomplicated: Secondary | ICD-10-CM | POA: Diagnosis not present

## 2020-06-14 DIAGNOSIS — R0602 Shortness of breath: Secondary | ICD-10-CM | POA: Diagnosis not present

## 2020-06-14 NOTE — Progress Notes (Signed)
Synopsis: Referred in 2011 for asthma by Ebony Frizzle, MD.  Previously a patient of Drs. Sunnie Nielsen, McQuaid and Kingman.  Subjective:   PATIENT ID: Ebony Scott GENDER: female DOB: 04-15-1950, MRN: [id number]  Did 2 rounds of the diflucan - after last visit with ENT, voice and throat are better.   Increased congestion over the past week. Coughing, not much mucous. Increased dyspnea. No sinus congestion but no drainage.  HPI  Chief Complaint  Patient presents with  . Follow-up    6wk f/u after bronch. States she has been struggling with SOB. Still using her cpap machine but feels it is not helping. She is also noticing more SOB during the day. Still using 2L of O2 at night as well. Coughing has been stable. Denies any chest pain.    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   She has completed two courses of diflucan per ENT for laryngeal candidiasis and her voice is much improved. We discontinued her inhaled steroid during this period of time as well. She is noticing more shortness of breath during the day.   She was recently transitioned to rituximab by her rheumatoligist and has been taken off liflunamide. She continues on 57m of prednisone daily.   CT Chest on 4/6 when compared to the CT Chest on 2/4 shows a decrease in the size of the nodular/consolidative opacities of the medial right lung base and left lower lobe. No new nodules or lesions noted.   OV 04/29/20 She underwent bronchoscopy on 04/05/20 and cultures remain unrevealing at this time. The BAL samples have grown candida albicans and we discussed that we typically do not treat this finding as it does not usually cause pathogenic disease.   She continues to experience hoarse voice and cough along with shortness of  breath. She denies weight loss, night sweats, loss of appetite.   OV 04/01/20: CT scan 03/25/2020 showed multifocal areas of nodularity and consolidative changes. Findings consistent with atypical infection or possible malignancy. She was started on azithromycin and cefpodoxime by Dr. PDennard Schaumann She has completed the azithromycin and she started the cefpodoxime on 2/7. She was treated with levaquin on 03/21/20 for 7 days initially until the CT scan resulted. Blood cultures were negative on 02/23/20 when she presented with AMS.   She reports she is coughing up brown sputum. At baseline she doe snot cough up sputum or it is clear. The sputum amount has declined she taking antibiotics. She denies fevers, chills or sweats. Her appetite is well.    OV 09/01/19: Ebony Scott a 70y/o woman who presents for follow up of bronchiectasis, tracheobronchomalacia, immunosuppression, chronic AC for recurrent VTE. She started prescribed levofloxacin last evening for Pseudomonas bronchiectasis AE. So far she has not had nausea associated with taking this med, but took zofran prophylacticaly. She remains SOB with no change so far in her symptoms. She still coughs frequently despite using her inhaler and hypertonic saline nebs + flutter as prescribed. She has follow up with her PCP soon for hyperglycemiaa. She has follow up with her rheumatologist in August and is hoping that her prednisone will be able to be decreased soon.  Past Medical History:  Diagnosis Date  . Acute deep vein thrombosis (DVT) of right lower extremity ([place] 12/13/2017  . Allergic rhinitis   .  Allergy    SEASONAL  . Anemia   . Angio-edema   . Anxiety    pt denies  . Arthritis    Phreesia 10/21/2019  . Asthma   . Asthma    Phreesia 10/21/2019  . Breast cancer ([place]) 1998   in remission  . Cataract    REMOVED  . Clostridium difficile colitis 01/14/2018  . Clostridium difficile diarrhea 01/14/2018  . Complication of anesthesia    "had hard time  waking up from it several times" (02/20/2012)  . Depression    "some; don't take anything for it" (02/20/2012)  . Diverticulosis   . DVT (deep venous thrombosis) ([place])   . Exertional dyspnea   . Fibromyalgia 11/2011  . GERD (gastroesophageal reflux disease)   . Graves disease   . Headache(784.0)    "related to allergies; more at different times during the year" (02/20/2012)  . Hemorrhoids   . Hiatal hernia    back and neck  . Hx of adenomatous colonic polyps 04/12/2016  . Hypercholesteremia    good cholesterol is high  . Hypothyroidism   . IBS (irritable bowel syndrome)   . Moderate persistent asthma    -FeV1 72% 2011, -IgE 102 2011, CT sinus Neg 2011  . Osteoporosis    on reclast yearly  . Peripheral vascular disease ([place]) 2019   DVTs  . Pneumonia 04/2011; ~ 11/2011   "double; single" (02/20/2012)  . Recurrent upper respiratory infection (URI)   . Seronegative rheumatoid arthritis ([place])    Dr. Lahoma Rocker  . SIRS (systemic inflammatory response syndrome) ([place]) 02/10/2018  . Tracheobronchomalacia      Family History  Problem Relation Age of Onset  . Allergies Mother   . Heart disease Mother   . Arthritis Mother   . Lung cancer Mother   . Diabetes Mother   . Allergies Father   . Heart disease Father   . Arthritis Father   . Stroke Father   . Colon cancer Other        Maternal half aunt/Maternal half uncle  . Colitis Daughter   . Diabetes Maternal Grandfather      Social History   Socioeconomic History  . Marital status: Divorced    Spouse name: Not on file  . Number of children: 2  . Years of education: college  . Highest education level: Not on file  Occupational History  . Occupation: Disabled    Comment: Retired Engineer, production: RETIRED  Tobacco Use  . Smoking status: Passive Smoke Exposure - Never Smoker  . Smokeless tobacco: Never Used  . Tobacco comment: Parents  Vaping Use  . Vaping Use: Never used  Substance and Sexual  Activity  . Alcohol use: Not Currently    Alcohol/week: 0.0 standard drinks  . Drug use: No  . Sexual activity: Never  Other Topics Concern  . Not on file  Social History Narrative   Patient lives at home alone. Patient  divorced.    Patient has her BS degree.   Right handed.   Caffeine- sometimes coffee.      [facility]:   Born in [place], [place]. She worked as a Copywriter, advertising. She has no pets currently. She does have indoor plants. Previously had mold in her home that was remediated. Carpet was removed.          Social Determinants of Health   Financial Resource Strain: Not on file  Food Insecurity: Not on file  Transportation  Needs: Not on file  Physical Activity: Not on file  Stress: Not on file  Social Connections: Not on file  Intimate Partner Violence: Not on file     Allergies  Allergen Reactions  . Dust Mite Extract Shortness Of Breath and Other (See Comments)    "sneezing" (02/20/2012)  . Molds & Smuts Shortness Of Breath  . Morphine And Related Hives and Itching  . Other Shortness Of Breath and Other (See Comments)    Grass and weeds "sneezing; filled sinuses" (02/20/2012)  . Penicillins Rash and Other (See Comments)    "welts" (02/20/2012) Has patient had a PCN reaction causing immediate rash, facial/tongue/throat swelling, SOB or lightheadedness with hypotension: Unknown Has patient had a PCN reaction causing severe rash involving mucus membranes or skin necrosis: No Has patient had a PCN reaction that required hospitalization: No Has patient had a PCN reaction occurring within the last 10 years: No If all of the above answers are "NO", then may proceed with Cephalosporin use. Tolerated Ancef 02/16/20.   Marland Kitchen Reclast [Zoledronic Acid] Other (See Comments)    Fever, Put in hospital, dr said it was a reaction from a reaction   . Rofecoxib Swelling    Vioxx REACTION: feet swelling  . Shellfish Allergy Anaphylaxis, Shortness Of Breath, Itching and Rash  .  Tetracycline Hcl Nausea And Vomiting  . Xolair [Omalizumab] Other (See Comments)    Caused Blood clot  . Dilaudid [Hydromorphone Hcl] Itching  . Hydrocodone-Acetaminophen Nausea And Vomiting  . Levofloxacin Other (See Comments)    GI upset  . Oxycodone Hcl Nausea And Vomiting  . Paroxetine Nausea And Vomiting    Paxil   . Baclofen Other (See Comments)    Altered mental status requiring 3 day hospital stay   . Celecoxib Swelling    Feet swelling  . Diltiazem Swelling  . Lactose Intolerance (Gi) Other (See Comments)    Bloating and gas  . Tree Extract Other (See Comments)    "tested and told I was allergic to it; never experienced a reaction to it" (02/20/2012)     Outpatient Medications Prior to Visit  Medication Sig Dispense Refill  . acetaminophen (TYLENOL) 650 MG CR tablet Take 1,300 mg by mouth every 8 (eight) hours as needed for pain.    Marland Kitchen albuterol (PROVENTIL) (2.5 MG/3ML) 0.083% nebulizer solution Take 3 mLs (2.5 mg total) by nebulization in the morning and at bedtime. Along with flutter valve 180 mL 5  . allopurinol (ZYLOPRIM) 100 MG tablet Take 100 mg by mouth daily.    . Alpha-D-Galactosidase (BEANO PO) Take 2-3 capsules by mouth 3 (three) times daily with meals.    . Alpha-Lipoic Acid 600 MG CAPS Take 600 mg by mouth daily.     Marland Kitchen ammonium lactate (AMLACTIN) 12 % cream Apply topically as needed for dry skin. (Patient taking differently: Apply 1 g topically at bedtime.) 385 g 2  . atorvastatin (LIPITOR) 10 MG tablet TAKE 1 TABLET BY MOUTH DAILY 90 tablet 3  . azelastine (ASTELIN) 0.1 % nasal spray USE 2 SPRAYS IN EACH NOSTRIL DAILY AS DIRECTED (Patient taking differently: Place 2 sprays into both nostrils daily as needed for allergies.) 30 mL 5  . benzonatate (TESSALON) 200 MG capsule TAKE 1 CAPSULE BY MOUTH TWICE DAILY AS NEEDED FOR COUGH 20 capsule 1  . Calcium Citrate (CITRACAL PO) Take 1 tablet by mouth in the morning and at bedtime.    Marland Kitchen CALCIUM-MAGNESIUM PO Take 1 tablet  by mouth 2 (two)  times daily.    . cetirizine (ZYRTEC) 10 MG tablet Take 10 mg by mouth daily.    . cholecalciferol (VITAMIN D) 25 MCG (1000 UT) tablet Take 1,000 Units by mouth daily.     Marland Kitchen COVID-19 mRNA vaccine, Pfizer, 30 MCG/0.3ML injection INJECT AS DIRECTED .3 mL 0  . cyproheptadine (PERIACTIN) 4 MG tablet TAKE 2 TABLETS BY MOUTH EVERY EVENING (Patient taking differently: Take 8 mg by mouth every evening.) 180 tablet 2  . denosumab (PROLIA) 60 MG/ML SOLN injection Inject 60 mg into the skin every 6 (six) months. Administer in upper arm, thigh, or abdomen    . DEXILANT 60 MG capsule TAKE 1 CAPSULE BY MOUTH EACH MORNING (Patient taking differently: Take 60 mg by mouth daily.) 90 capsule 3  . diclofenac Sodium (VOLTAREN) 1 % GEL Apply 2 g topically 4 (four) times daily as needed (joint pain). 100 g 6  . EPINEPHrine (EPIPEN 2-PAK) 0.3 mg/0.3 mL IJ SOAJ injection Inject 0.3 mLs (0.3 mg total) into the muscle Once PRN. (Patient taking differently: Inject 0.3 mg into the muscle as needed for anaphylaxis.) 0.3 mL 2  . famotidine (PEPCID) 20 MG tablet TAKE 1 TABLET BY MOUTH TWICE A DAY (Patient taking differently: Take 20 mg by mouth 2 (two) times daily.) 60 tablet 5  . fluticasone (FLONASE) 50 MCG/ACT nasal spray USE 2 SPRAYS IN EACH NOSTRIL DAILY 16 g 2  . folic acid (FOLVITE) 1 MG tablet Take 1 mg by mouth daily.    . furosemide (LASIX) 20 MG tablet Take 1 tablet (20 mg total) by mouth every evening. Take 40 mg in the AM, and take additional 20 mg in the PM for swelling (Patient taking differently: Take 20 mg by mouth 2 (two) times daily. Take 40 mg in the AM, and take additional 20 mg in the PM for swelling) 30 tablet 3  . furosemide (LASIX) 40 MG tablet Take 1 tablet (40 mg total) by mouth daily as needed. 30 tablet 3  . gabapentin (NEURONTIN) 600 MG tablet TAKE 1 TABLET BY MOUTH DAILY (Patient taking differently: Take 600 mg by mouth at bedtime.) 90 tablet 3  . guaiFENesin (MUCINEX) 600 MG 12 hr  tablet Take 1 tablet (600 mg total) by mouth 2 (two) times daily as needed for cough or to loosen phlegm. 60 tablet 1  . hydrocortisone 2.5 % cream Apply topically 2 (two) times daily as needed (hemorrhoids). 28 g 3  . Lactase 9000 units TABS Take 18,000-27,000 Units by mouth 4 (four) times daily as needed (dairy consumption).    Marland Kitchen lidocaine (LIDODERM) 5 % Place 1 patch onto the skin daily as needed (pain). Remove & Discard patch within 12 hours or as directed by MD    . lidocaine (LIDODERM) 5 % APPLY 1 PATCH TOPICALLY AS DIRECTED 30 patch 0  . magnesium oxide (MAG-OX) 400 MG tablet Take 400 mg by mouth daily.    . mirtazapine (REMERON SOL-TAB) 30 MG disintegrating tablet DISSOLVE 1 TABLET BY MOUTH EVERY NIGHT AT BEDTIME 90 tablet 1  . MITIGARE 0.6 MG CAPS Take 0.6 mg by mouth every other day.    . montelukast (SINGULAIR) 10 MG tablet TAKE 1 TABLET BY MOUTH DAILY (Patient taking differently: Take 10 mg by mouth every evening.) 30 tablet 5  . mupirocin ointment (BACTROBAN) 2 % APPLY TO BED SORES AS NEEDED 22 g 0  . Nystatin (GERHARDT'S BUTT CREAM) CREA Apply 1 application topically daily as needed (hemorrhoids). NYSTATIN 100,000 UNIT/GM CR 100000  CRM- 60 g, ZINC OXIDE 20 % OINT 20 OINT- 60 g, HYDROCORTISONE 1 % CREAM- 60 g 1 each 11  . Nystatin (GERHARDT'S BUTT CREAM) CREA Apply to the affected areas daily as needed for hemorrhoids 1 each 11  . nystatin (MYCOSTATIN) 100000 UNIT/ML suspension SWISH AND SPIT 5 MLS TWICE DAILY (Patient taking differently: Use as directed 5 mLs in the mouth or throat at bedtime.) 473 mL 3  . nystatin cream (MYCOSTATIN) Apply 1 application topically 2 (two) times daily.    Marland Kitchen nystatin-triamcinolone ointment (MYCOLOG) Apply topically 2 (two) times daily. (Patient taking differently: Apply 1 application topically 2 (two) times daily as needed (yeast infections).) 90 g 1  . ondansetron (ZOFRAN) 4 MG tablet Take 1 tablet (4 mg total) by mouth every 8 (eight) hours as needed for  nausea or vomiting. 10 tablet 0  . predniSONE (DELTASONE) 5 MG tablet Take 15 mg by mouth daily with breakfast.    . Prenatal Vit-Fe Fumarate-FA (PRENATAL MULTIVITAMIN) TABS tablet Take 1 tablet by mouth daily.    . Probiotic Product (ALIGN PO) Take 1 capsule by mouth daily.    Marland Kitchen Propylene Glycol (SYSTANE COMPLETE) 0.6 % SOLN Place 1 drop into both eyes 2 (two) times daily as needed (dry eyes).    Marland Kitchen Respiratory Therapy Supplies (FLUTTER) DEVI Use as directed 1 each 0  . Salicylic Acid-Urea (KERASAL EX) Apply 1 application topically at bedtime.    . sodium chloride HYPERTONIC 3 % nebulizer solution Take by nebulization 2 (two) times daily. 750 mL 12  . Spacer/Aero-Holding Chambers (AEROCHAMBER PLUS) inhaler Use as instructed 1 each 0  . Tiotropium Bromide-Olodaterol (STIOLTO RESPIMAT) 2.5-2.5 MCG/ACT AERS Inhale 2 puffs into the lungs daily. 4 g 3  . XARELTO 20 MG TABS tablet TAKE 1 TABLET BY MOUTH DAILY WITH SUPPER (Patient taking differently: Take 20 mg by mouth every evening.) 90 tablet 3  . leflunomide (ARAVA) 20 MG tablet Take 20 mg by mouth daily.    . mupirocin cream (BACTROBAN) 2 % APPLY TOPICALLY TWICE A DAY 15 g 0   Facility-Administered Medications Prior to Visit  Medication Dose Route Frequency Provider Last Rate Last Admin  . denosumab (PROLIA) injection 60 mg  60 mg Subcutaneous Q6 months Ebony Frizzle, MD   60 mg at 03/31/20 1224    Review of Systems  Constitutional: Negative for chills, fever, malaise/fatigue and weight loss.  HENT: Negative for congestion, sinus pain and sore throat.   Eyes: Negative.   Respiratory: Positive for cough, sputum production, shortness of breath and wheezing. Negative for hemoptysis.   Cardiovascular: Negative for chest pain, palpitations, orthopnea, claudication and leg swelling.  Gastrointestinal: Negative for abdominal pain, heartburn, nausea and vomiting.  Genitourinary: Negative.   Musculoskeletal: Negative for joint pain and myalgias.   Skin: Negative for rash.  Neurological: Negative for weakness.  Endo/Heme/Allergies: Negative.   Psychiatric/Behavioral: Negative.     Objective:   Vitals:   06/14/20 1134  BP: 132/82  Pulse: 76  Temp: (!) 97.4 F (36.3 C)  TempSrc: Temporal  SpO2: 95%  Weight: 143 lb (64.9 kg)  Height: _0  (1.549 m)     Physical Exam Constitutional:      General: She is not in acute distress.    Appearance: She is not ill-appearing.  HENT:     Head: Normocephalic and atraumatic.  Eyes:     General: No scleral icterus.    Conjunctiva/sclera: Conjunctivae normal.     Pupils: Pupils are equal, round,  and reactive to light.  Cardiovascular:     Rate and Rhythm: Normal rate and regular rhythm.     Pulses: Normal pulses.     Heart sounds: Normal heart sounds. No murmur heard.   Pulmonary:     Effort: Pulmonary effort is normal.     Breath sounds: No wheezing, rhonchi or rales.     Comments: Course breath sounds Abdominal:     General: Bowel sounds are normal.     Palpations: Abdomen is soft.  Musculoskeletal:     Right lower leg: No edema.     Left lower leg: No edema.  Lymphadenopathy:     Cervical: No cervical adenopathy.  Skin:    General: Skin is warm and dry.  Neurological:     General: No focal deficit present.     Mental Status: She is alert.  Psychiatric:        Mood and Affect: Mood normal.        Behavior: Behavior normal.        Thought Content: Thought content normal.        Judgment: Judgment normal.    CBC    Component Value Date/Time   WBC 10.1 04/29/2020 1158   RBC 3.12 (L) 04/29/2020 1158   HGB 10.9 (L) 04/29/2020 1158   HCT 33.9 (L) 04/29/2020 1158   PLT 214.0 04/29/2020 1158   MCV 108.7 (H) 04/29/2020 1158   MCH 34.8 (H) 03/31/2020 1215   MCHC 32.1 04/29/2020 1158   RDW 17.1 (H) 04/29/2020 1158   LYMPHSABS 0.6 (L) 04/29/2020 1158   MONOABS 1.0 04/29/2020 1158   EOSABS 0.1 04/29/2020 1158   BASOSABS 0.1 04/29/2020 1158   BMP Latest Ref Rng  & Units 04/29/2020 03/31/2020 03/21/2020  Glucose 70 - 99 mg/dL 147(H) 118(H) 249(H)  BUN 6 - 23 mg/dL 39(H) 31(H) 32(H)  Creatinine 0.40 - 1.20 mg/dL 1.47(H) 1.19(H) 1.34(H)  BUN/Creat Ratio 6 - 22 (calc) - 26(H) 24(H)  Sodium 135 - 145 mEq/L 139 140 141  Potassium 3.5 - 5.1 mEq/L 4.4 4.1 4.2  Chloride 96 - 112 mEq/L 100 99 100  CO2 19 - 32 mEq/L _0 Calcium 8.4 - 10.5 mg/dL 9.2 9.2 8.4(L)   Chest imaging: Chest Imaging- films reviewed: CT Chest wo contrast 05/25/20 - near resolution of the RUL 47m nodule. Nodular opacity of medial RUL is now 626mfrom 1345mThe opacity of the medial RLL is now 1.8x1.0cm from 2.0x1.1 and the LLL opacity is 1.9x1.6cm from 3.7x3.7cm.  CT chest wo contrast 03/25/2020 - multifocal areas of nodularity and consolidative changes. Findings consistent with atypical infection or possible malignancy.  CT high-resolution 10/24/2018-mild bronchiectasis right middle lobe, lingula, right lower lobe, but does taper more appropriately distally.  Persistent nodules in right middle lobe and lingula with mild tree-in-bud opacities.  No inter or intralobular septal thickening or significant groundglass.  Expiratory images with some mosaicism and significant bronchial and tracheal collapse, especially on the right.   HRCT chest 08/30/19: RUL TIB opacities, multilobar bronchiectasis. Tracheobronchomalacia progressed since previous CT scan. Subacute rib fractures- healing. CAD.  PFT: PFT Results Latest Ref Rng & Units 08/20/2019 09/18/2018 06/19/2017 08/08/2015 04/29/2015  FVC-Pre L 1.72 1.74 2.00 2.08 2.24  FVC-Predicted Pre % 66 66 74 75 81  FVC-Post L 1.65 1.61 1.86 2.17 2.25  FVC-Predicted Post % 63 61 69 78 81  Pre FEV1/FVC % % 90 88 90 83 87  Post FEV1/FCV % % 90 91 93 87 84  FEV1-Pre L 1.55 1.53 1.80 1.73 1.95  FEV1-Predicted Pre % 79 76 88 82 92  FEV1-Post L 1.49 1.47 1.72 1.89 1.89  DLCO uncorrected ml/min/mmHg 11.33 13.51 13.68 12.36 12.99  DLCO UNC% % 64 77 66 60 63  DLCO  corrected ml/min/mmHg 11.33 - - 13.76 12.87  DLCO COR %Predicted % 64 - - 66 62  DLVA Predicted % 83 92 85 94 81  TLC L 3.71 3.52 3.95 - 3.91  TLC % Predicted % 80 76 85 - 84  RV % Predicted % 76 79 86 - 83   2020-no significant obstruction, mild restriction, mild DLCO reduction.  Flow volume loops consistent with restriction.  2021-obstruction or bronchodilator reversibility.  No significant restriction.  Mild diffusion impairment.  Compared to 2020, mild improvement.  Previous respiratory cultures- all fungal and AFB negative from right middle lobe and lingula during bronchoscopy 08/2016, only positive for normal flora  BAL 04/05/20 - Candida Albicans   Pathology 08/23/2016: BAL lingula-no AFB or fungus  Echocardiogram: Unable to review report from 02/2018.  08/05/2017 demonstrates LVEF 65 to 06%, grade 1 diastolic dysfunction.  Normal LA, RV, RA.  Trivial TR and MR, otherwise normal valves.  Echocardiogram 07/10/2019-LVEF greater than 75% with hyperdynamic function, moderate LVH with grade 1 diastolic dysfunction.  Normal LA, RV, RA.  Trivial AR, otherwise normal valves.  Allergy panel 04/02/2016-increased IgE for Aspergillus, otherwise negative Alpha-1 antitrypsin 170  Bronchoscopy 12/24/2018 (per[facility] at [facility]) Neutrophils 12% Lymphocytes 1% Macrophages 79% Respiratory viral panel negative Unable to see PJP DNA Respiratory culture-1+ mixed gram-negative rods, normal flora Fungus culture negative AFB negative  08/25/19 sputum- Pan-sensitive Pseudomonas  Assessment & Plan:   Bronchiectasis without complication (HCC)  Tracheobronchomalacia  Multiple lung nodules on CT - Plan: CT Chest Wo Contrast  Discussion: Ebony Scott is a 70 year old woman, never smoker with asthma, tracheobronchomalacia on CPAP with oxygen, bronchiectasis and rheumatoid arthritis on rituximab and 43m prednisone who returns to pulmonary clinic for follow up.   The nodules and opacities on  her CT scans have significantly improved from 03/2020 to the recent scan on 05/25/2020. Her 04/05/20 bronchoscopy BAL results were positive for candida albicans.  She was initially treated with antibiotics prior to the bronchoscopy in late January and early February, then treated with diflucan by ENT in 04/2020 and 05/2020 for laryngeal candidiasis. The pulmonary opacities are most likely infectious or inflammatory, less likely malignant given their decrease in size.     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 No MPO or PR3 levels have been checked. Vasculitis could be a cause of the pulmonary opacities as well, but this will also be treated by the rituximab she has recently started. We can consider checking MPO and PR3 levels at next visit.   She has ongoing cough and wheezing which could be attributed to her tracheobronchomalacia and history of asthma. She is to continue stiolto. We are trying to avoid inhaled corticosteroids at this time.  We will repeat a CT chest scan in early July with follow up in clinic there after.  JFreda Jackson MD [facility] Office: [phone]  Current Outpatient Medications:  .  acetaminophen (TYLENOL) 650 MG CR tablet, Take 1,300 mg by mouth every 8 (eight) hours as needed for pain., Disp: , Rfl:  .  albuterol (PROVENTIL) (2.5 MG/3ML) 0.083% nebulizer solution, Take 3 mLs (2.5 mg total) by nebulization in the morning and at bedtime. Along with flutter  valve, Disp: 180 mL, Rfl: 5 .  allopurinol (ZYLOPRIM) 100 MG tablet, Take 100 mg by mouth daily., Disp: , Rfl:  .  Alpha-D-Galactosidase (BEANO PO), Take 2-3 capsules by mouth 3 (three) times daily with meals., Disp: , Rfl:  .  Alpha-Lipoic Acid 600 MG CAPS, Take 600 mg by mouth daily. , Disp: , Rfl:  .  ammonium lactate (AMLACTIN) 12 % cream, Apply topically as needed for dry skin. (Patient taking differently: Apply 1 g topically at bedtime.), Disp: 385 g, Rfl: 2 .  atorvastatin  (LIPITOR) 10 MG tablet, TAKE 1 TABLET BY MOUTH DAILY, Disp: 90 tablet, Rfl: 3 .  azelastine (ASTELIN) 0.1 % nasal spray, USE 2 SPRAYS IN EACH NOSTRIL DAILY AS DIRECTED (Patient taking differently: Place 2 sprays into both nostrils daily as needed for allergies.), Disp: 30 mL, Rfl: 5 .  benzonatate (TESSALON) 200 MG capsule, TAKE 1 CAPSULE BY MOUTH TWICE DAILY AS NEEDED FOR COUGH, Disp: 20 capsule, Rfl: 1 .  Calcium Citrate (CITRACAL PO), Take 1 tablet by mouth in the morning and at bedtime., Disp: , Rfl:  .  CALCIUM-MAGNESIUM PO, Take 1 tablet by mouth 2 (two) times daily., Disp: , Rfl:  .  cetirizine (ZYRTEC) 10 MG tablet, Take 10 mg by mouth daily., Disp: , Rfl:  .  cholecalciferol (VITAMIN D) 25 MCG (1000 UT) tablet, Take 1,000 Units by mouth daily. , Disp: , Rfl:  .  COVID-19 mRNA vaccine, Pfizer, 30 MCG/0.3ML injection, INJECT AS DIRECTED, Disp: .3 mL, Rfl: 0 .  cyproheptadine (PERIACTIN) 4 MG tablet, TAKE 2 TABLETS BY MOUTH EVERY EVENING (Patient taking differently: Take 8 mg by mouth every evening.), Disp: 180 tablet, Rfl: 2 .  denosumab (PROLIA) 60 MG/ML SOLN injection, Inject 60 mg into the skin every 6 (six) months. Administer in upper arm, thigh, or abdomen, Disp: , Rfl:  .  DEXILANT 60 MG capsule, TAKE 1 CAPSULE BY MOUTH EACH MORNING (Patient taking differently: Take 60 mg by mouth daily.), Disp: 90 capsule, Rfl: 3 .  diclofenac Sodium (VOLTAREN) 1 % GEL, Apply 2 g topically 4 (four) times daily as needed (joint pain)., Disp: 100 g, Rfl: 6 .  EPINEPHrine (EPIPEN 2-PAK) 0.3 mg/0.3 mL IJ SOAJ injection, Inject 0.3 mLs (0.3 mg total) into the muscle Once PRN. (Patient taking differently: Inject 0.3 mg into the muscle as needed for anaphylaxis.), Disp: 0.3 mL, Rfl: 2 .  famotidine (PEPCID) 20 MG tablet, TAKE 1 TABLET BY MOUTH TWICE A DAY (Patient taking differently: Take 20 mg by mouth 2 (two) times daily.), Disp: 60 tablet, Rfl: 5 .  fluticasone (FLONASE) 50 MCG/ACT nasal spray, USE 2 SPRAYS  IN EACH NOSTRIL DAILY, Disp: 16 g, Rfl: 2 .  folic acid (FOLVITE) 1 MG tablet, Take 1 mg by mouth daily., Disp: , Rfl:  .  furosemide (LASIX) 20 MG tablet, Take 1 tablet (20 mg total) by mouth every evening. Take 40 mg in the AM, and take additional 20 mg in the PM for swelling (Patient taking differently: Take 20 mg by mouth 2 (two) times daily. Take 40 mg in the AM, and take additional 20 mg in the PM for swelling), Disp: 30 tablet, Rfl: 3 .  furosemide (LASIX) 40 MG tablet, Take 1 tablet (40 mg total) by mouth daily as needed., Disp: 30 tablet, Rfl: 3 .  gabapentin (NEURONTIN) 600 MG tablet, TAKE 1 TABLET BY MOUTH DAILY (Patient taking differently: Take 600 mg by mouth at bedtime.), Disp: 90 tablet, Rfl: 3 .  guaiFENesin (MUCINEX) 600 MG 12 hr tablet, Take 1 tablet (600 mg total) by mouth 2 (two) times daily as needed for cough or to loosen phlegm., Disp: 60 tablet, Rfl: 1 .  hydrocortisone 2.5 % cream, Apply topically 2 (two) times daily as needed (hemorrhoids)., Disp: 28 g, Rfl: 3 .  Lactase 9000 units TABS, Take 18,000-27,000 Units by mouth 4 (four) times daily as needed (dairy consumption)., Disp: , Rfl:  .  lidocaine (LIDODERM) 5 %, Place 1 patch onto the skin daily as needed (pain). Remove & Discard patch within 12 hours or as directed by MD, Disp: , Rfl:  .  lidocaine (LIDODERM) 5 %, APPLY 1 PATCH TOPICALLY AS DIRECTED, Disp: 30 patch, Rfl: 0 .  magnesium oxide (MAG-OX) 400 MG tablet, Take 400 mg by mouth daily., Disp: , Rfl:  .  mirtazapine (REMERON SOL-TAB) 30 MG disintegrating tablet, DISSOLVE 1 TABLET BY MOUTH EVERY NIGHT AT BEDTIME, Disp: 90 tablet, Rfl: 1 .  MITIGARE 0.6 MG CAPS, Take 0.6 mg by mouth every other day., Disp: , Rfl:  .  montelukast (SINGULAIR) 10 MG tablet, TAKE 1 TABLET BY MOUTH DAILY (Patient taking differently: Take 10 mg by mouth every evening.), Disp: 30 tablet, Rfl: 5 .  mupirocin ointment (BACTROBAN) 2 %, APPLY TO BED SORES AS NEEDED, Disp: 22 g, Rfl: 0 .   Nystatin (GERHARDT'S BUTT CREAM) CREA, Apply 1 application topically daily as needed (hemorrhoids). NYSTATIN 100,000 UNIT/GM CR 100000 CRM- 60 g, ZINC OXIDE 20 % OINT 20 OINT- 60 g, HYDROCORTISONE 1 % CREAM- 60 g, Disp: 1 each, Rfl: 11 .  Nystatin (GERHARDT'S BUTT CREAM) CREA, Apply to the affected areas daily as needed for hemorrhoids, Disp: 1 each, Rfl: 11 .  nystatin (MYCOSTATIN) 100000 UNIT/ML suspension, SWISH AND SPIT 5 MLS TWICE DAILY (Patient taking differently: Use as directed 5 mLs in the mouth or throat at bedtime.), Disp: 473 mL, Rfl: 3 .  nystatin cream (MYCOSTATIN), Apply 1 application topically 2 (two) times daily., Disp: , Rfl:  .  nystatin-triamcinolone ointment (MYCOLOG), Apply topically 2 (two) times daily. (Patient taking differently: Apply 1 application topically 2 (two) times daily as needed (yeast infections).), Disp: 90 g, Rfl: 1 .  ondansetron (ZOFRAN) 4 MG tablet, Take 1 tablet (4 mg total) by mouth every 8 (eight) hours as needed for nausea or vomiting., Disp: 10 tablet, Rfl: 0 .  predniSONE (DELTASONE) 5 MG tablet, Take 15 mg by mouth daily with breakfast., Disp: , Rfl:  .  Prenatal Vit-Fe Fumarate-FA (PRENATAL MULTIVITAMIN) TABS tablet, Take 1 tablet by mouth daily., Disp: , Rfl:  .  Probiotic Product (ALIGN PO), Take 1 capsule by mouth daily., Disp: , Rfl:  .  Propylene Glycol (SYSTANE COMPLETE) 0.6 % SOLN, Place 1 drop into both eyes 2 (two) times daily as needed (dry eyes)., Disp: , Rfl:  .  Respiratory Therapy Supplies (FLUTTER) DEVI, Use as directed, Disp: 1 each, Rfl: 0 .  Salicylic Acid-Urea (KERASAL EX), Apply 1 application topically at bedtime., Disp: , Rfl:  .  sodium chloride HYPERTONIC 3 % nebulizer solution, Take by nebulization 2 (two) times daily., Disp: 750 mL, Rfl: 12 .  Spacer/Aero-Holding Chambers (AEROCHAMBER PLUS) inhaler, Use as instructed, Disp: 1 each, Rfl: 0 .  Tiotropium Bromide-Olodaterol (STIOLTO RESPIMAT) 2.5-2.5 MCG/ACT AERS, Inhale 2 puffs  into the lungs daily., Disp: 4 g, Rfl: 3 .  XARELTO 20 MG TABS tablet, TAKE 1 TABLET BY MOUTH DAILY WITH SUPPER (Patient taking differently: Take 20 mg by mouth  every evening.), Disp: 90 tablet, Rfl: 3  Current Facility-Administered Medications:  .  denosumab (PROLIA) injection 60 mg, 60 mg, Subcutaneous, Q6 months, Ebony Frizzle, MD, 60 mg at 03/31/20 1224

## 2020-06-14 NOTE — Patient Instructions (Addendum)
Continue stiolto 2 puffs daily  Continue as needed albuterol nebulizer treatments - take them 3 times daily scheduled  Continue montelukast and zyrtec

## 2020-06-17 DIAGNOSIS — S46011D Strain of muscle(s) and tendon(s) of the rotator cuff of right shoulder, subsequent encounter: Secondary | ICD-10-CM | POA: Diagnosis not present

## 2020-06-21 ENCOUNTER — Encounter: Payer: Self-pay | Admitting: Family Medicine

## 2020-06-21 DIAGNOSIS — M7989 Other specified soft tissue disorders: Secondary | ICD-10-CM | POA: Diagnosis not present

## 2020-06-21 DIAGNOSIS — M79643 Pain in unspecified hand: Secondary | ICD-10-CM | POA: Diagnosis not present

## 2020-06-21 DIAGNOSIS — E790 Hyperuricemia without signs of inflammatory arthritis and tophaceous disease: Secondary | ICD-10-CM | POA: Diagnosis not present

## 2020-06-21 DIAGNOSIS — M810 Age-related osteoporosis without current pathological fracture: Secondary | ICD-10-CM | POA: Diagnosis not present

## 2020-06-21 DIAGNOSIS — M0609 Rheumatoid arthritis without rheumatoid factor, multiple sites: Secondary | ICD-10-CM | POA: Diagnosis not present

## 2020-06-21 DIAGNOSIS — N1831 Chronic kidney disease, stage 3a: Secondary | ICD-10-CM | POA: Diagnosis not present

## 2020-06-21 DIAGNOSIS — R748 Abnormal levels of other serum enzymes: Secondary | ICD-10-CM | POA: Diagnosis not present

## 2020-06-21 DIAGNOSIS — Z7952 Long term (current) use of systemic steroids: Secondary | ICD-10-CM | POA: Diagnosis not present

## 2020-06-21 DIAGNOSIS — Z79899 Other long term (current) drug therapy: Secondary | ICD-10-CM | POA: Diagnosis not present

## 2020-06-23 ENCOUNTER — Encounter: Payer: Self-pay | Admitting: Pulmonary Disease

## 2020-06-29 IMAGING — CT CT ANGIO CHEST
2 of 8 series · 18 of 46 positions shown · IV contrast (iopamidol)
Comparison: High-resolution chest CT 08/05/2017 and earlier.

ADDENDUM:
Critical Value/emergent results were called by telephone at the time
of interpretation on 12/13/2017 at 7902 hours to Dr. NANA IS-HAK ,
who verbally acknowledged these results.
CLINICAL DATA: 67-year-old female with right lower extremity DVT in
the calf. Cough. Found down.

EXAM:
CT ANGIOGRAPHY CHEST WITH CONTRAST
TECHNIQUE: Multidetector CT imaging of the chest was performed using the
standard protocol during bolus administration of intravenous
contrast. Multiplanar CT image reconstructions and MIPs were
obtained to evaluate the vascular anatomy.
CONTRAST:  100mL KBDBKE-PPR IOPAMIDOL (KBDBKE-PPR) INJECTION 76%

[Series 6: thins · axial · 0.73mm/px · z∈[+1351,+1561]mm · 15 of 232 slices shown]
[im 11/232  lung]
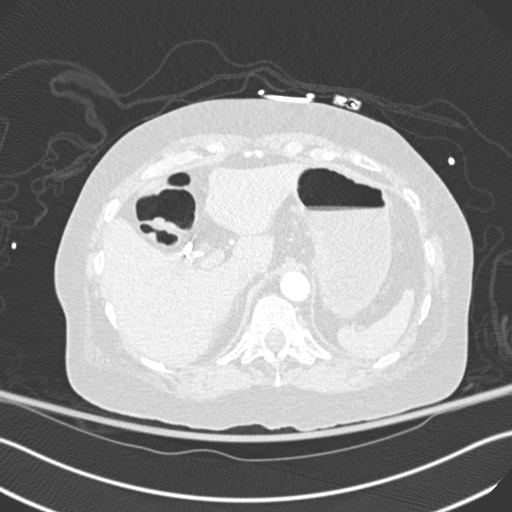
[im 32/232  soft-tissue]
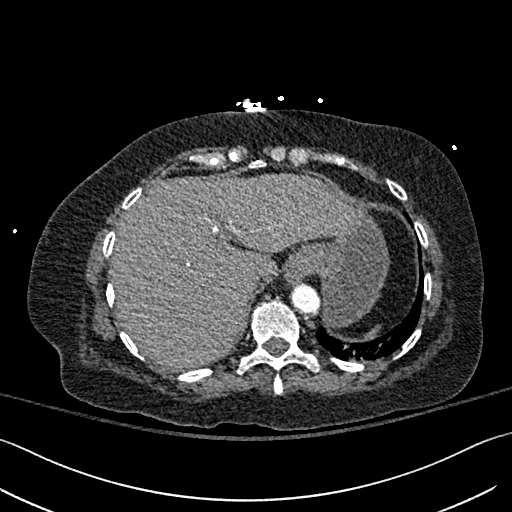
[im 43/232  lung]
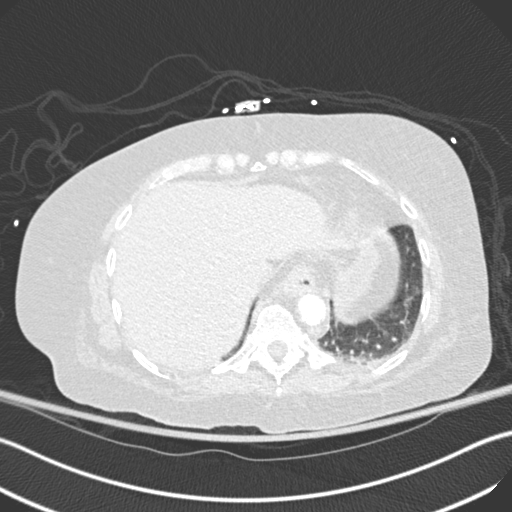
[im 53/232  soft-tissue]
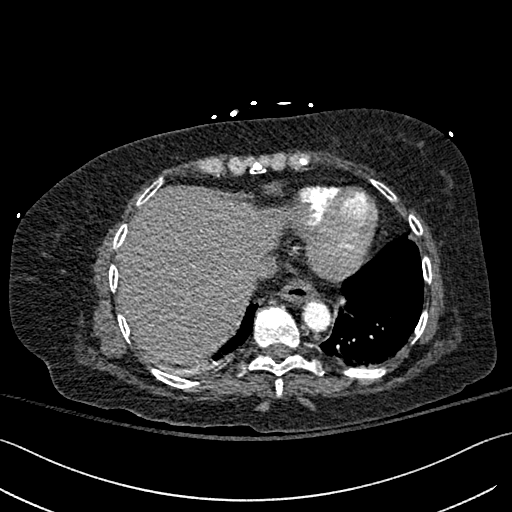
[im 74/232  lung]
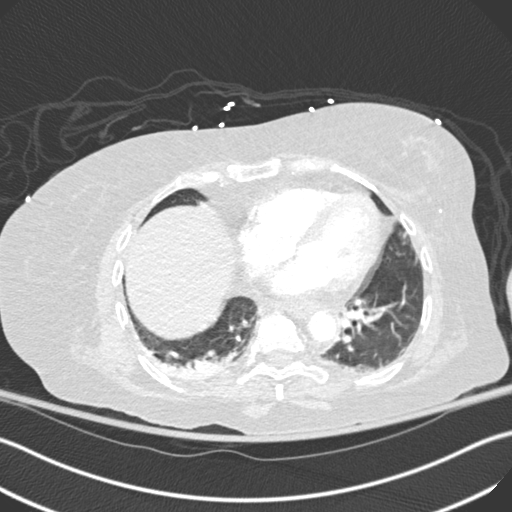
[im 85/232  soft-tissue]
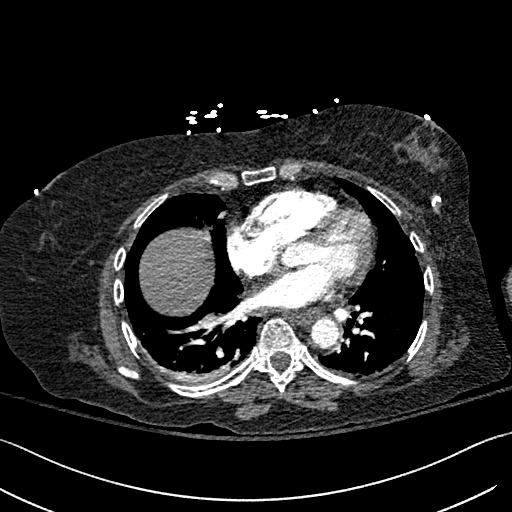
[im 106/232  lung]
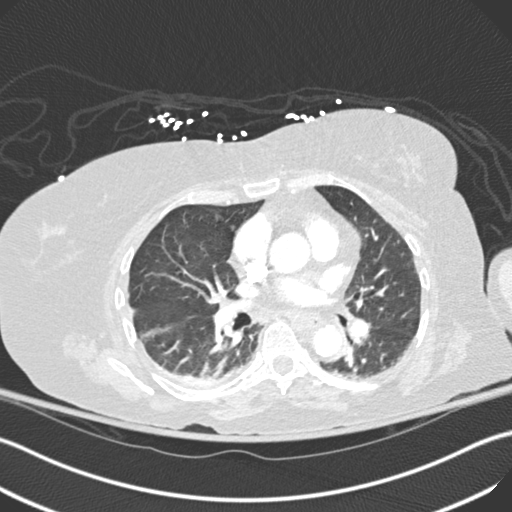
[im 116/232  soft-tissue]
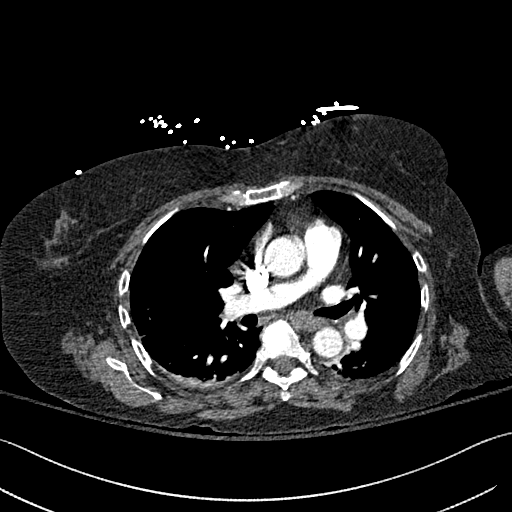
[im 127/232  lung]
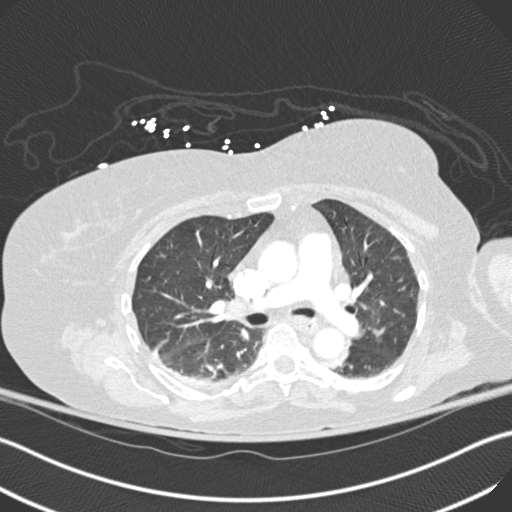
[im 148/232  soft-tissue]
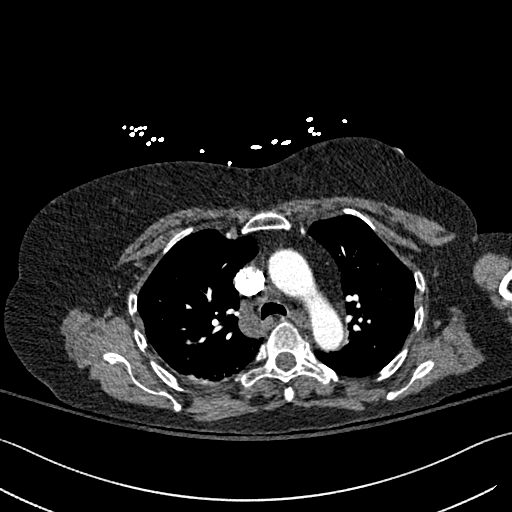
[im 158/232  lung]
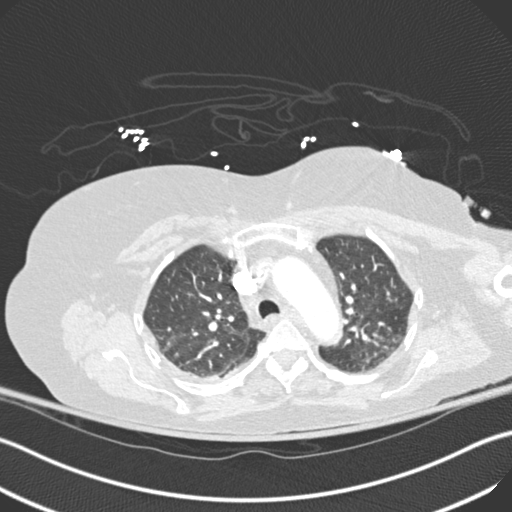
[im 179/232  soft-tissue]
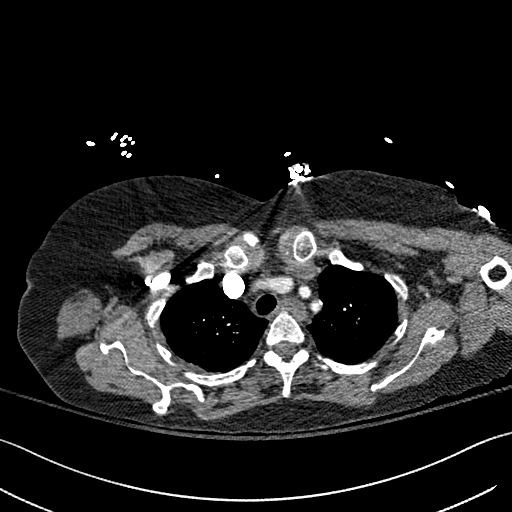
[im 190/232  lung]
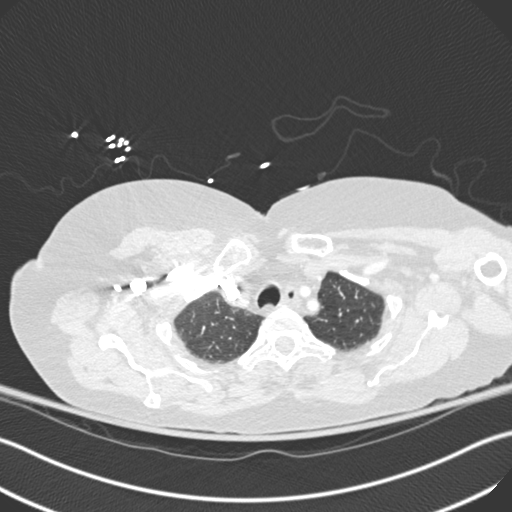
[im 200/232  soft-tissue]
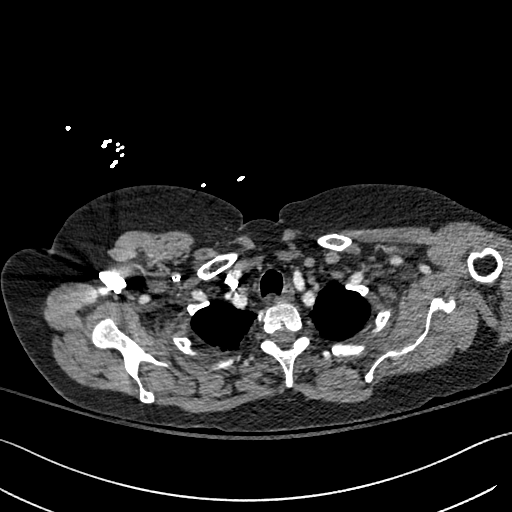
[im 221/232  lung]
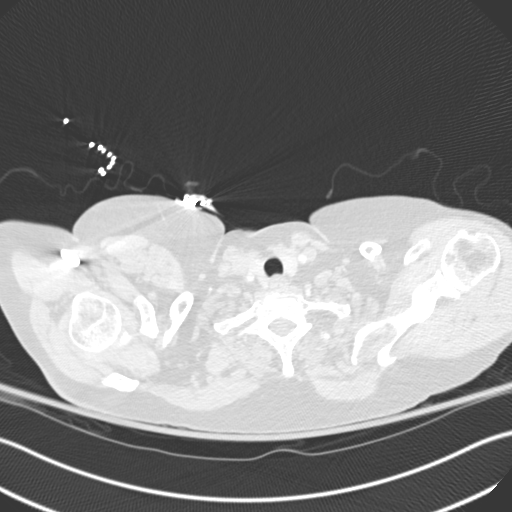

[Series 8: coronal mpr · coronal · 0.47mm/px · 3 of 132 slices shown]
[im 33/132  soft-tissue]
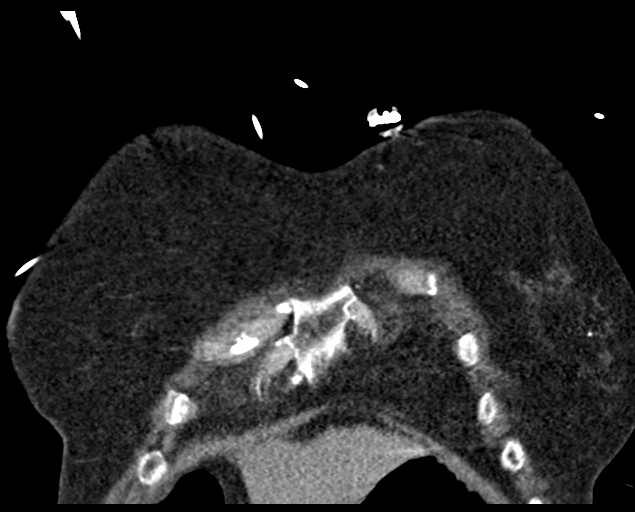
[im 66/132  soft-tissue]
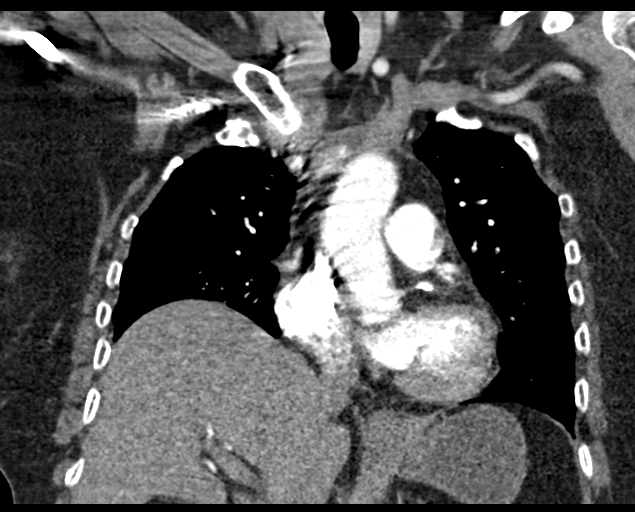
[im 99/132  soft-tissue]
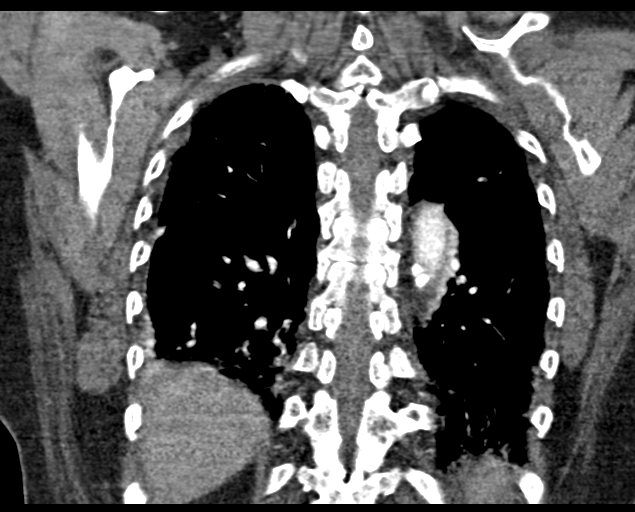

[18 of 46 positions shown; findings below may reference images not displayed]

FINDINGS: Cardiovascular: Adequate contrast bolus timing in the pulmonary
arterial tree. Lower lobe respiratory motion. There is a small
pulmonary embolus in a branch to the posterior left upper lobe seen
on series 6, image 97. No other left lung PE is evident. No central
or saddle embolus. There is positive thrombus in the right middle
lobe pulmonary artery on series 6, image 122. No other right lung PE
identified.

Mild cardiomegaly. No pericardial effusion. Calcified aortic
atherosclerosis. Calcified coronary artery atherosclerosis.

Mediastinum/Nodes: Negative, no lymphadenopathy.

Lungs/Pleura: Low lung volumes with atelectasis. No pulmonary
infarct or consolidation. Trace if any pleural fluid. The major
airways are patent.

Upper Abdomen: Surgically absent gallbladder. Negative visible
liver, spleen, right adrenal gland. Mild fluid and gas distended
visible proximal stomach.

Musculoskeletal: Partially visible postoperative changes to the C7
spinous process. Moderate to severe cervicothoracic junction facet
degeneration is partially visible. No acute osseous abnormality
identified.

Review of the MIP images confirms the above findings.
IMPRESSION: 1. Positive for small volume Acute Pulmonary Emboli in both lungs.
2. No pulmonary infarct identified. Trace if any pleural fluid.
3. Mild cardiomegaly. Calcified coronary artery and Aortic
Atherosclerosis ([id number]).

## 2020-06-29 IMAGING — DX DG CHEST 1V PORT
1 series · 1 of 1 positions shown · non-contrast
Comparison: Chest x-ray dated 05/15/2017 and chest CT dated
08/05/2017

CLINICAL DATA: Cough.  The patient was found down.

EXAM:
PORTABLE CHEST 1 VIEW

[chest ap]
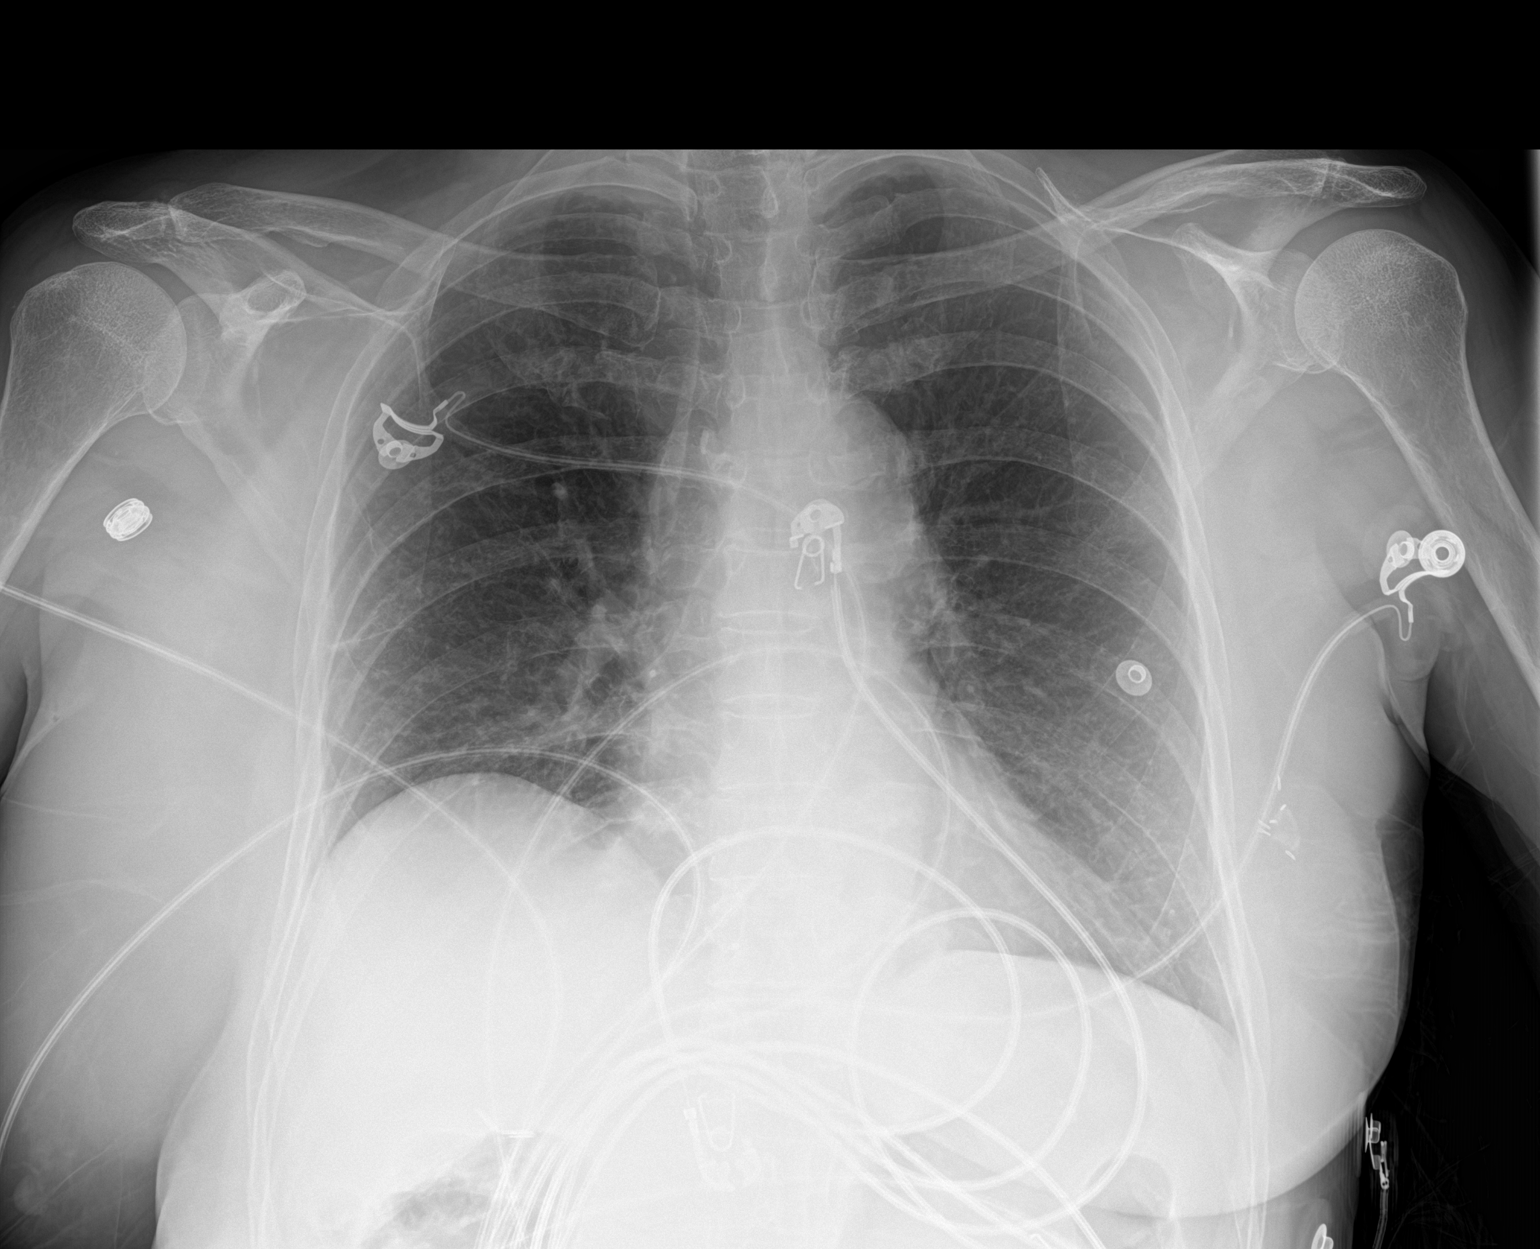

[1 of 1 positions shown; findings below may reference images not displayed]

FINDINGS: Heart size and vascularity are normal. No infiltrates or effusions.
Slight elevation of the right hemidiaphragm without visible
atelectasis.

No bone abnormality.
IMPRESSION: No significant abnormality.

## 2020-06-29 IMAGING — CT CT HEAD W/O CM
3 of 4 series · 16 of 47 positions shown, 19 images · non-contrast
Comparison: Brain MRI 12/17/2012

CLINICAL DATA: Altered level of consciousness, unexplained

EXAM:
CT HEAD WITHOUT CONTRAST
TECHNIQUE: Contiguous axial images were obtained from the base of the skull
through the vertex without intravenous contrast.

[Series 4: head 2.0 h70h · axial · 0.41mm/px · z∈[-89,+51]mm · 10 of 82 slices shown, 13 images]
[im 8/82  brain]
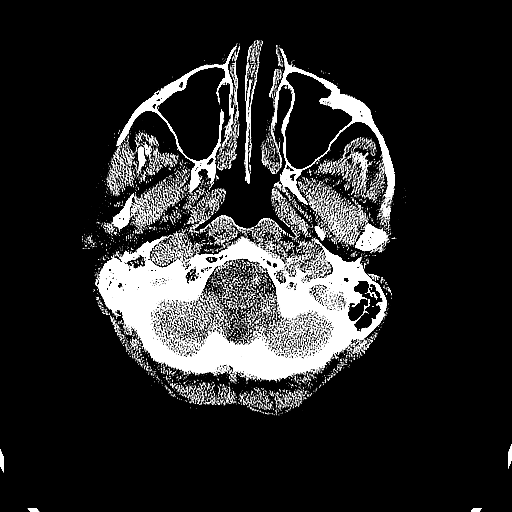
[im 8/82  bone]
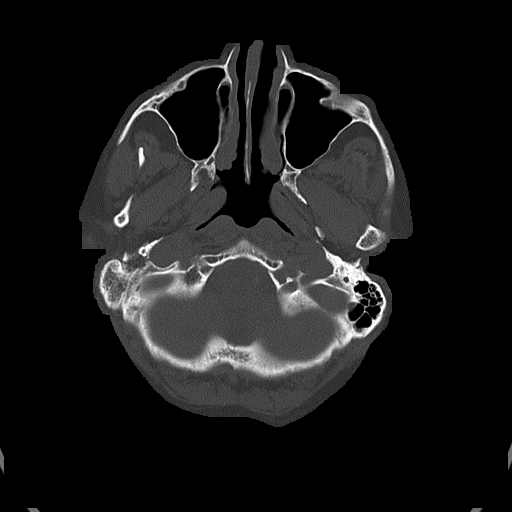
[im 16/82  brain]
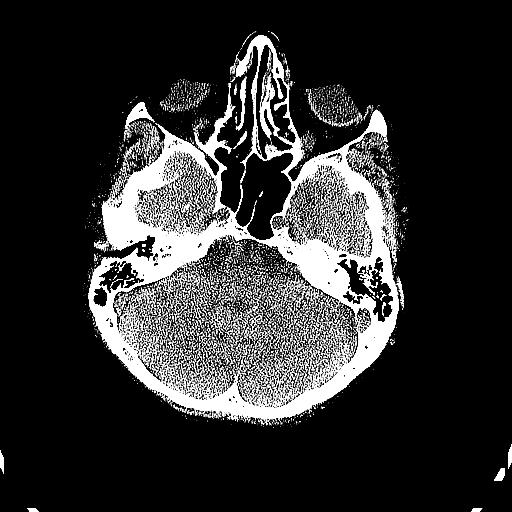
[im 24/82  brain]
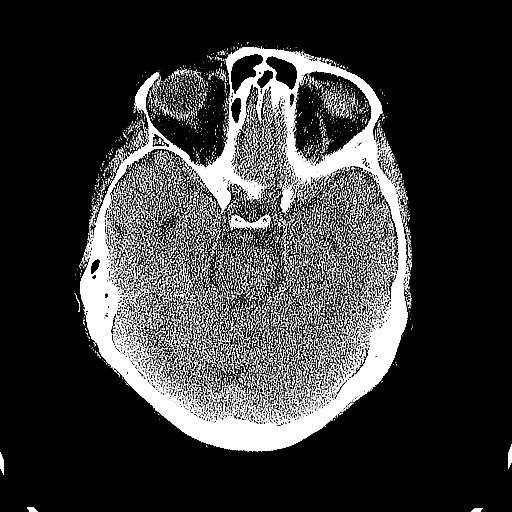
[im 31/82  brain]
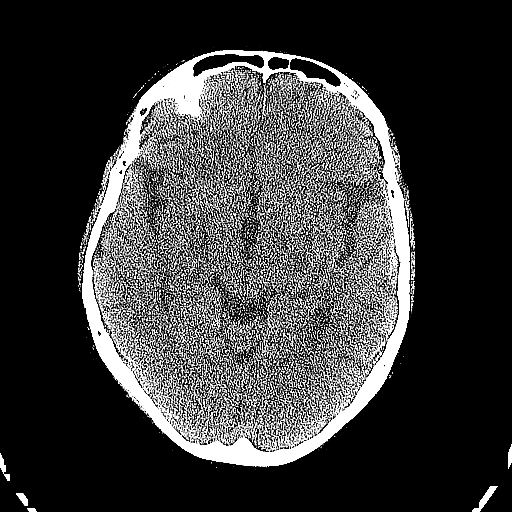
[im 39/82  brain]
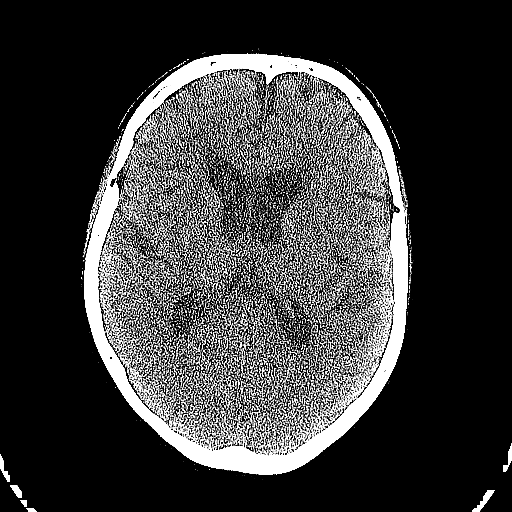
[im 39/82  bone]
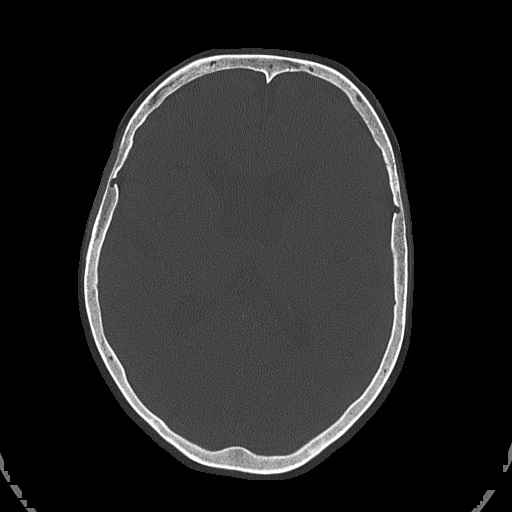
[im 47/82  brain]
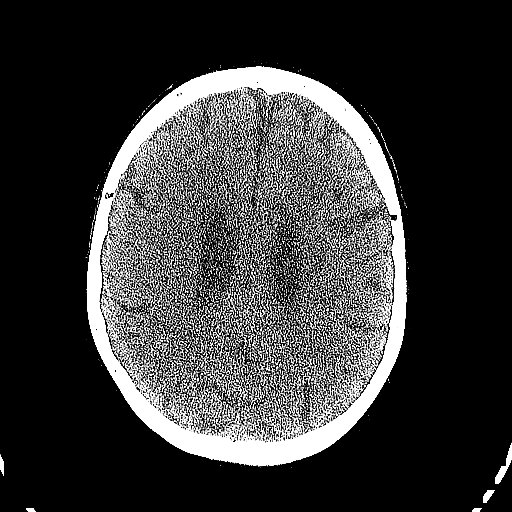
[im 55/82  brain]
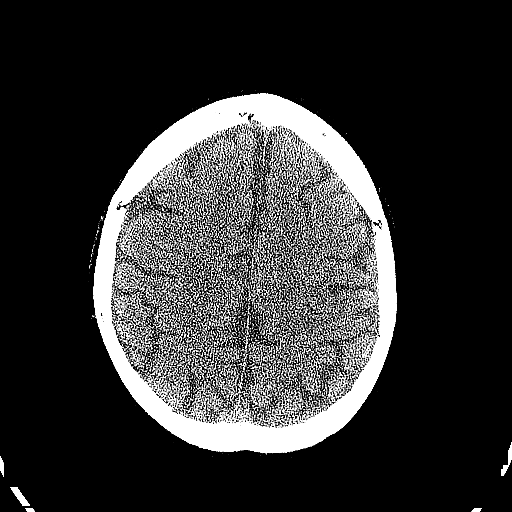
[im 62/82  brain]
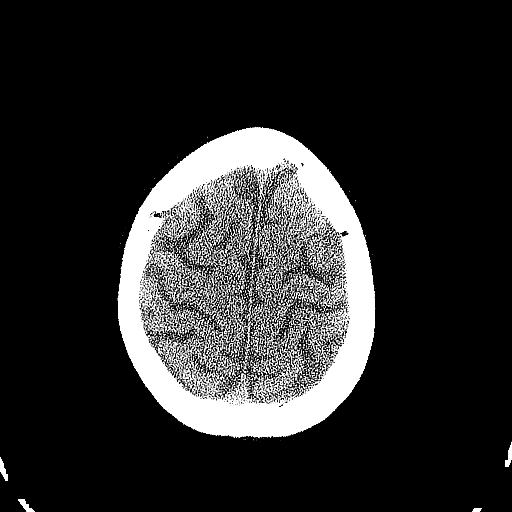
[im 70/82  brain]
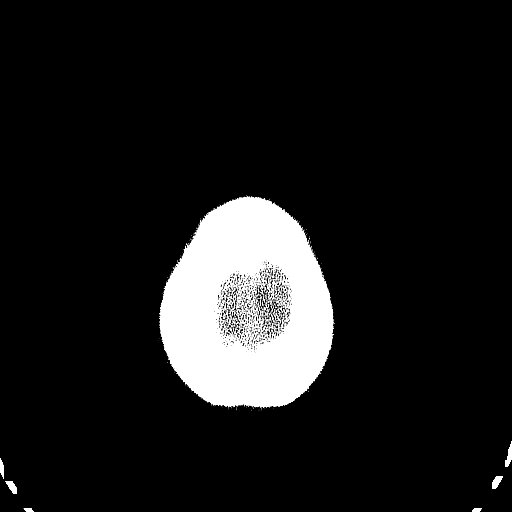
[im 70/82  bone]
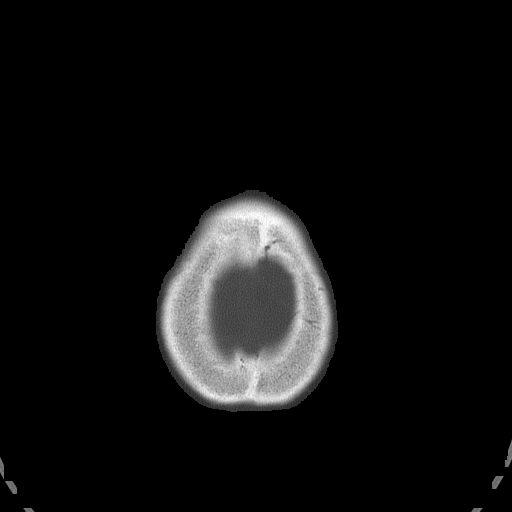
[im 78/82  brain]
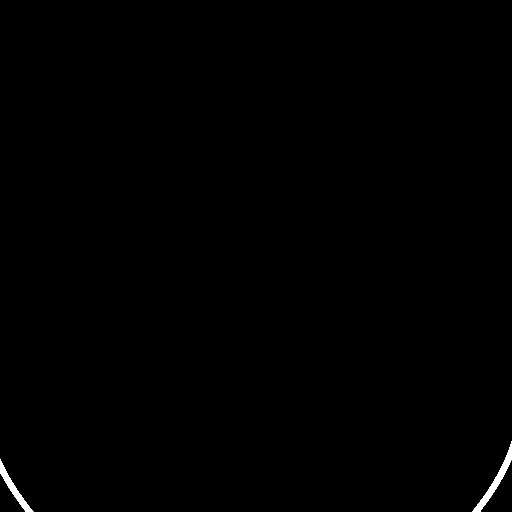

[Series 5: head 3.0 mpr cor · coronal · 0.32mm/px · 3 of 67 slices shown]
[im 23/67  brain]
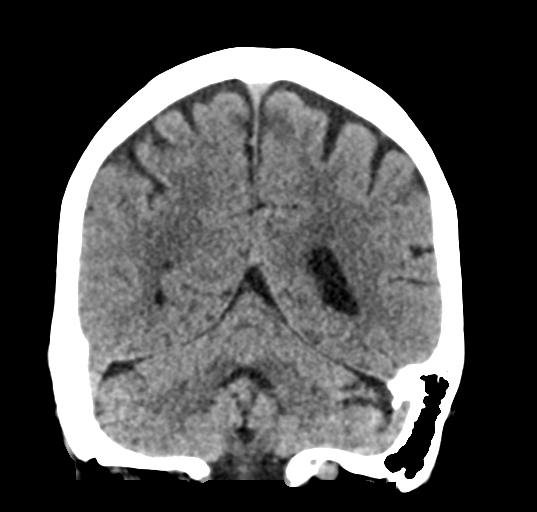
[im 30/67  brain]
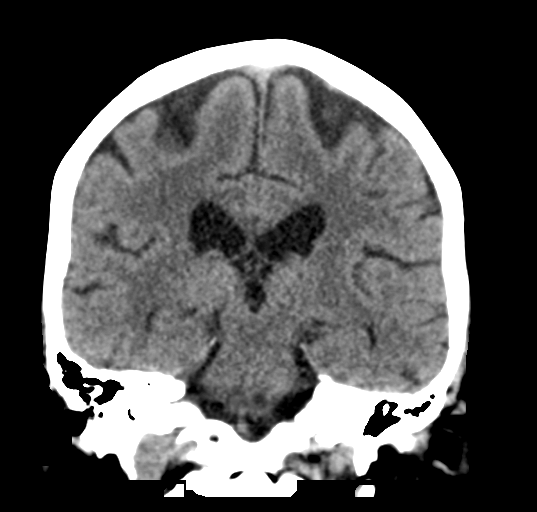
[im 37/67  brain]
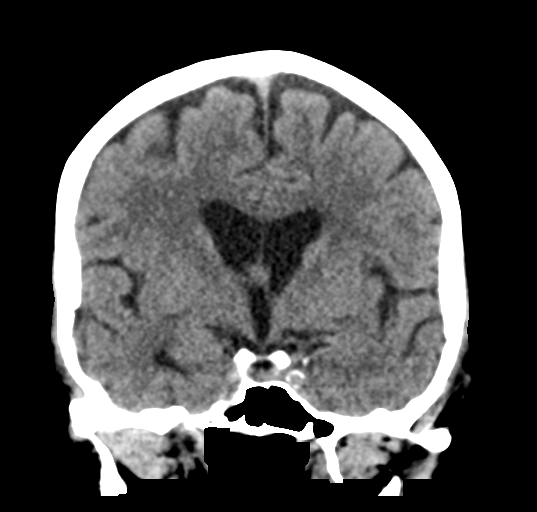

[Series 6: head 3.0 mpr sag · sagittal · 0.32mm/px · 3 of 54 slices shown]
[im 18/54  brain]
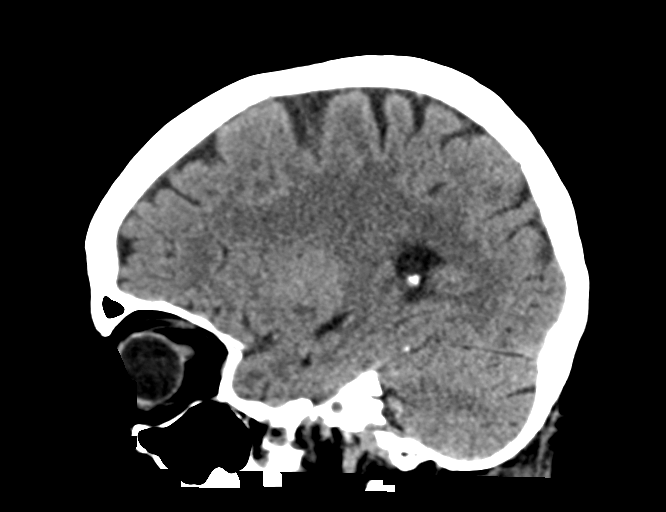
[im 27/54  brain]
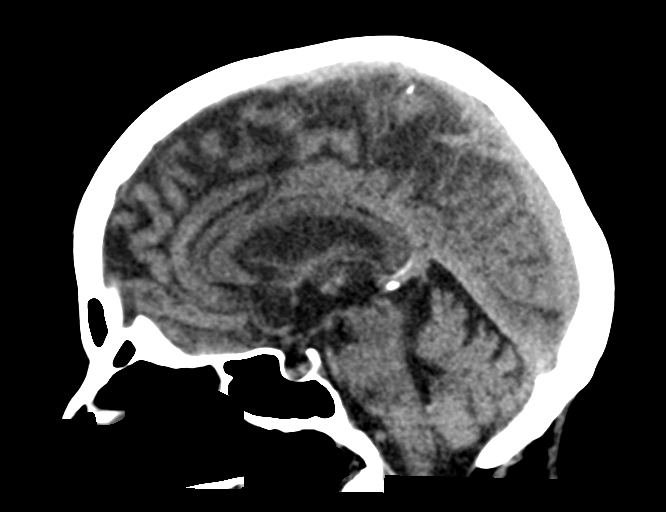
[im 36/54  brain]
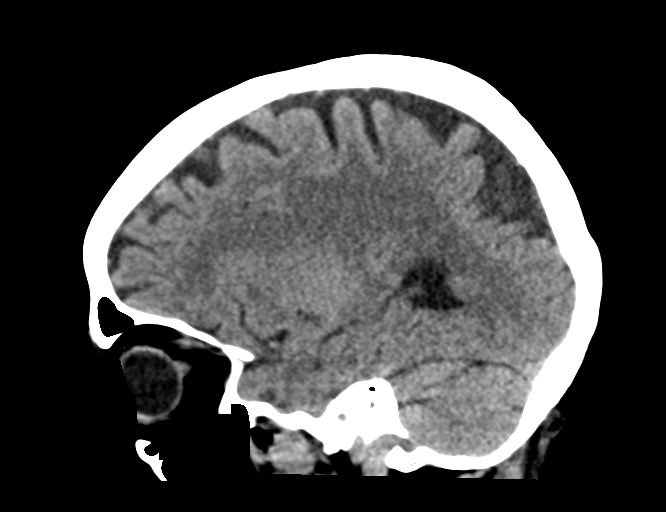

[16 of 47 positions shown; findings below may reference images not displayed]

FINDINGS: Brain: No evidence of acute infarction, hemorrhage, hydrocephalus,
extra-axial collection or mass lesion/mass effect. Cerebral volume
loss, central predominant. Mild chronic small vessel ischemic change
in the cerebral white matter.

Vascular: No hyperdense vessel or unexpected calcification.

Skull: Negative

Sinuses/Orbits: No acute finding.  Bilateral cataract resection.
IMPRESSION: No acute or reversible finding.

Mild atrophy and chronic small vessel ischemia.

## 2020-06-30 ENCOUNTER — Encounter: Payer: Self-pay | Admitting: Family Medicine

## 2020-07-01 IMAGING — DX DG KNEE 3 VIEWS*L*
4 series · 4 of 4 positions shown · non-contrast
Comparison: None.

CLINICAL DATA: Patient reports knee pain . Says she fell on [REDACTED].
Says she twisted when she fell.

EXAM:
LEFT KNEE - 3 VIEW

[knee ap]
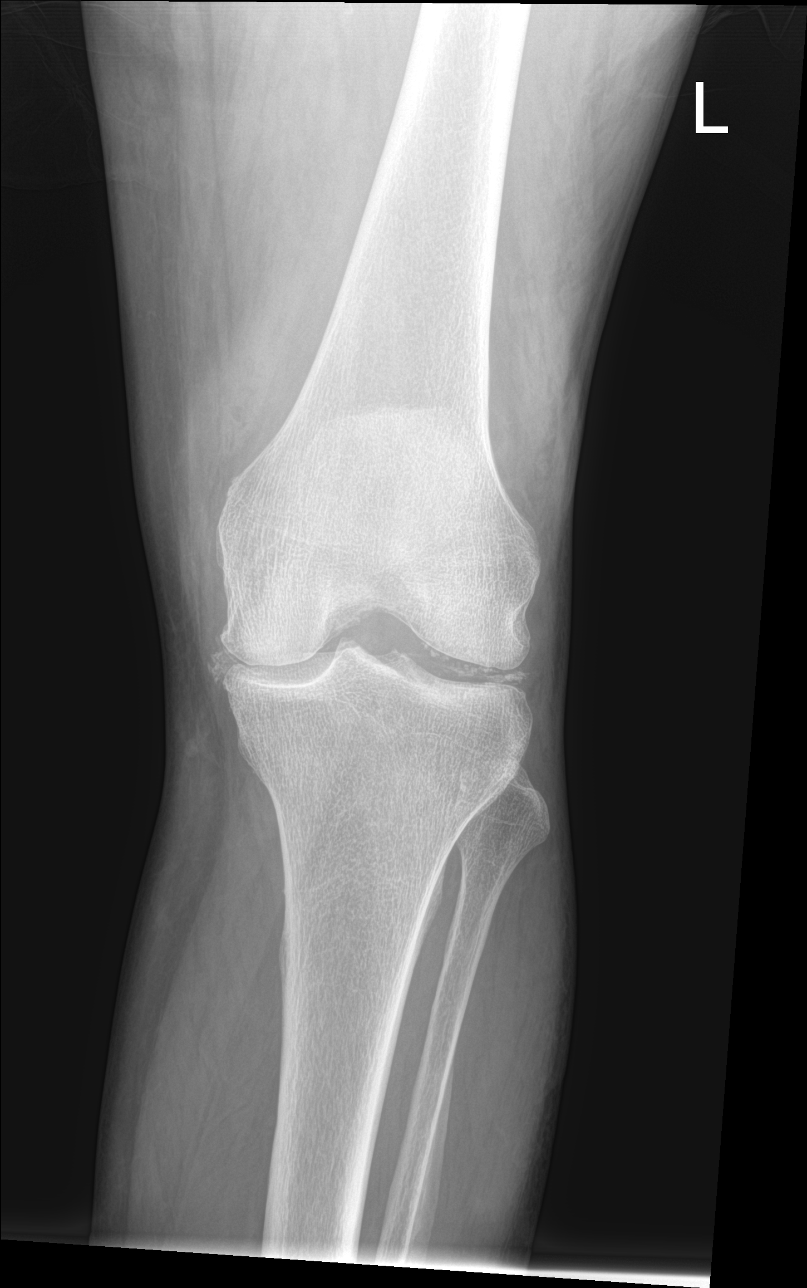

[knee tunnel]
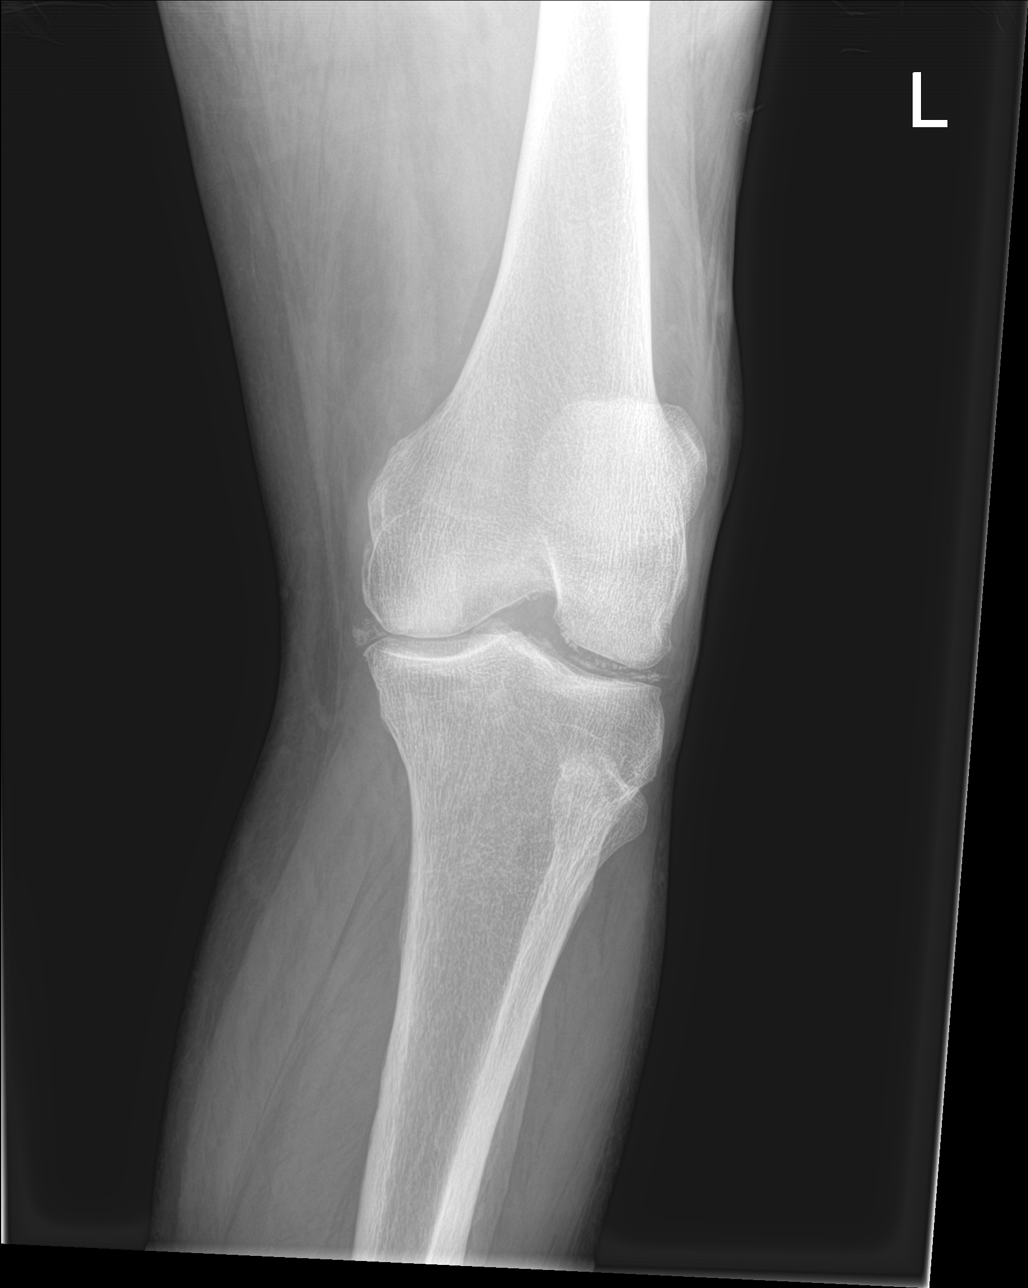

[knee lat]
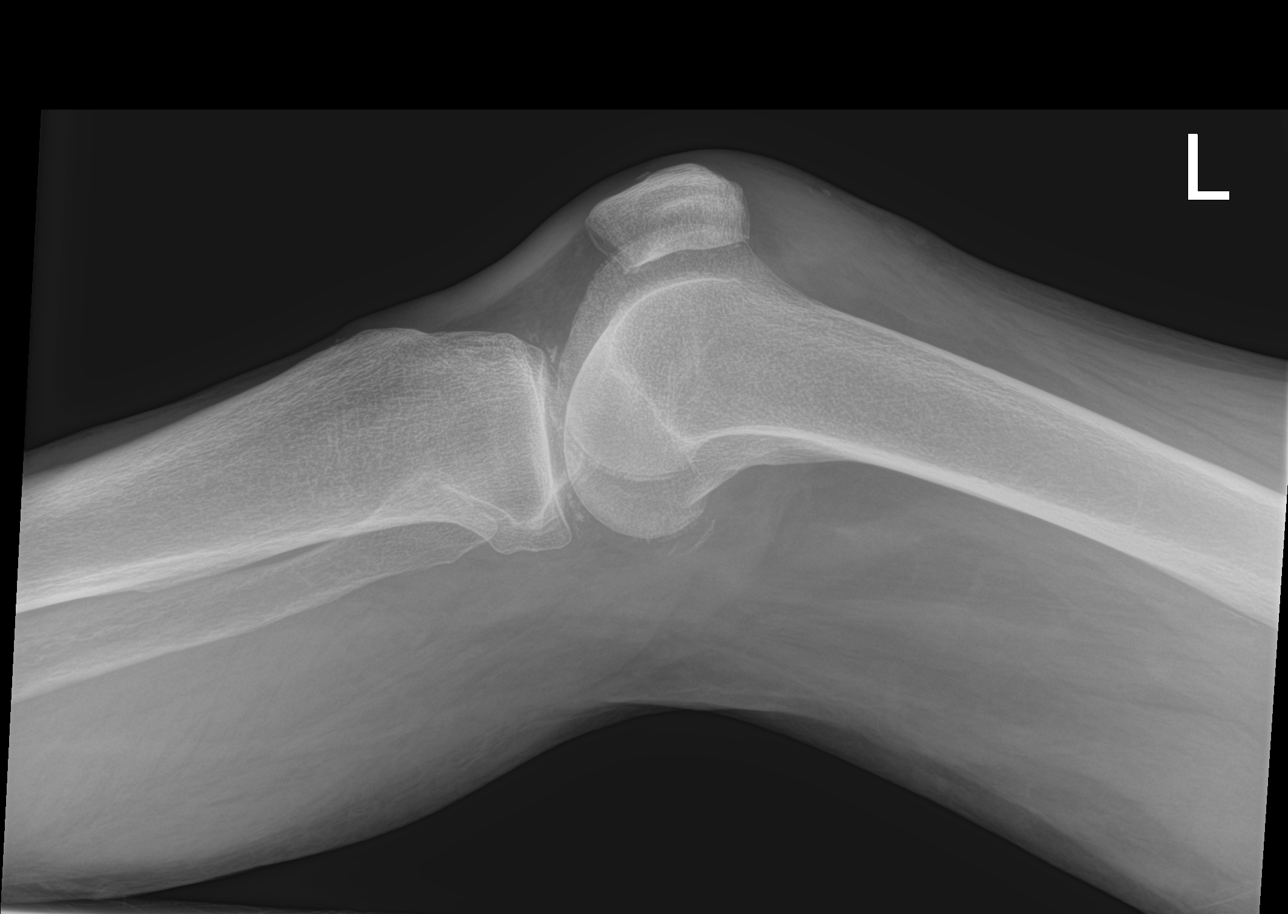

[patella skyline]
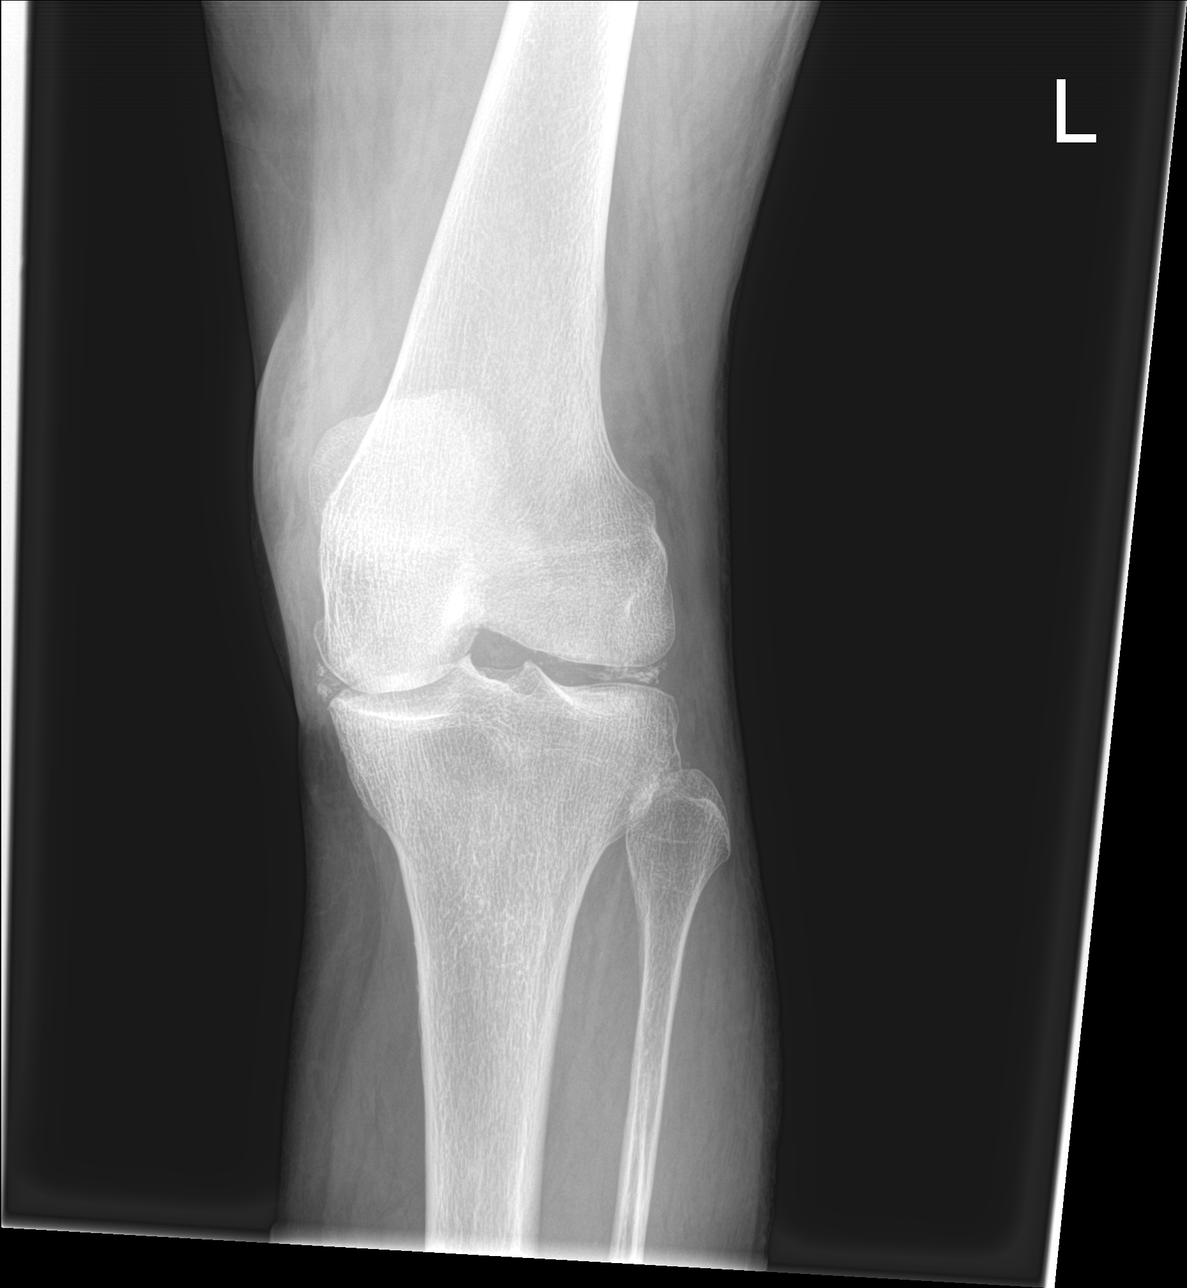

[4 of 4 positions shown; findings below may reference images not displayed]

FINDINGS: No fracture.  No bone lesion.

Knee joint normally spaced and aligned. No significant arthropathic
change. Chondrocalcinosis extends along the menisci.

Minimal joint effusion.

Soft tissues are unremarkable.
IMPRESSION: 1. No fracture.
2. Minimal joint effusion.

## 2020-07-02 IMAGING — DX DG ANKLE COMPLETE 3+V*L*
3 series · 3 of 3 positions shown · non-contrast
Comparison: None.

CLINICAL DATA: Pain

EXAM:
LEFT ANKLE COMPLETE - 3+ VIEW

[x ankle lat left]
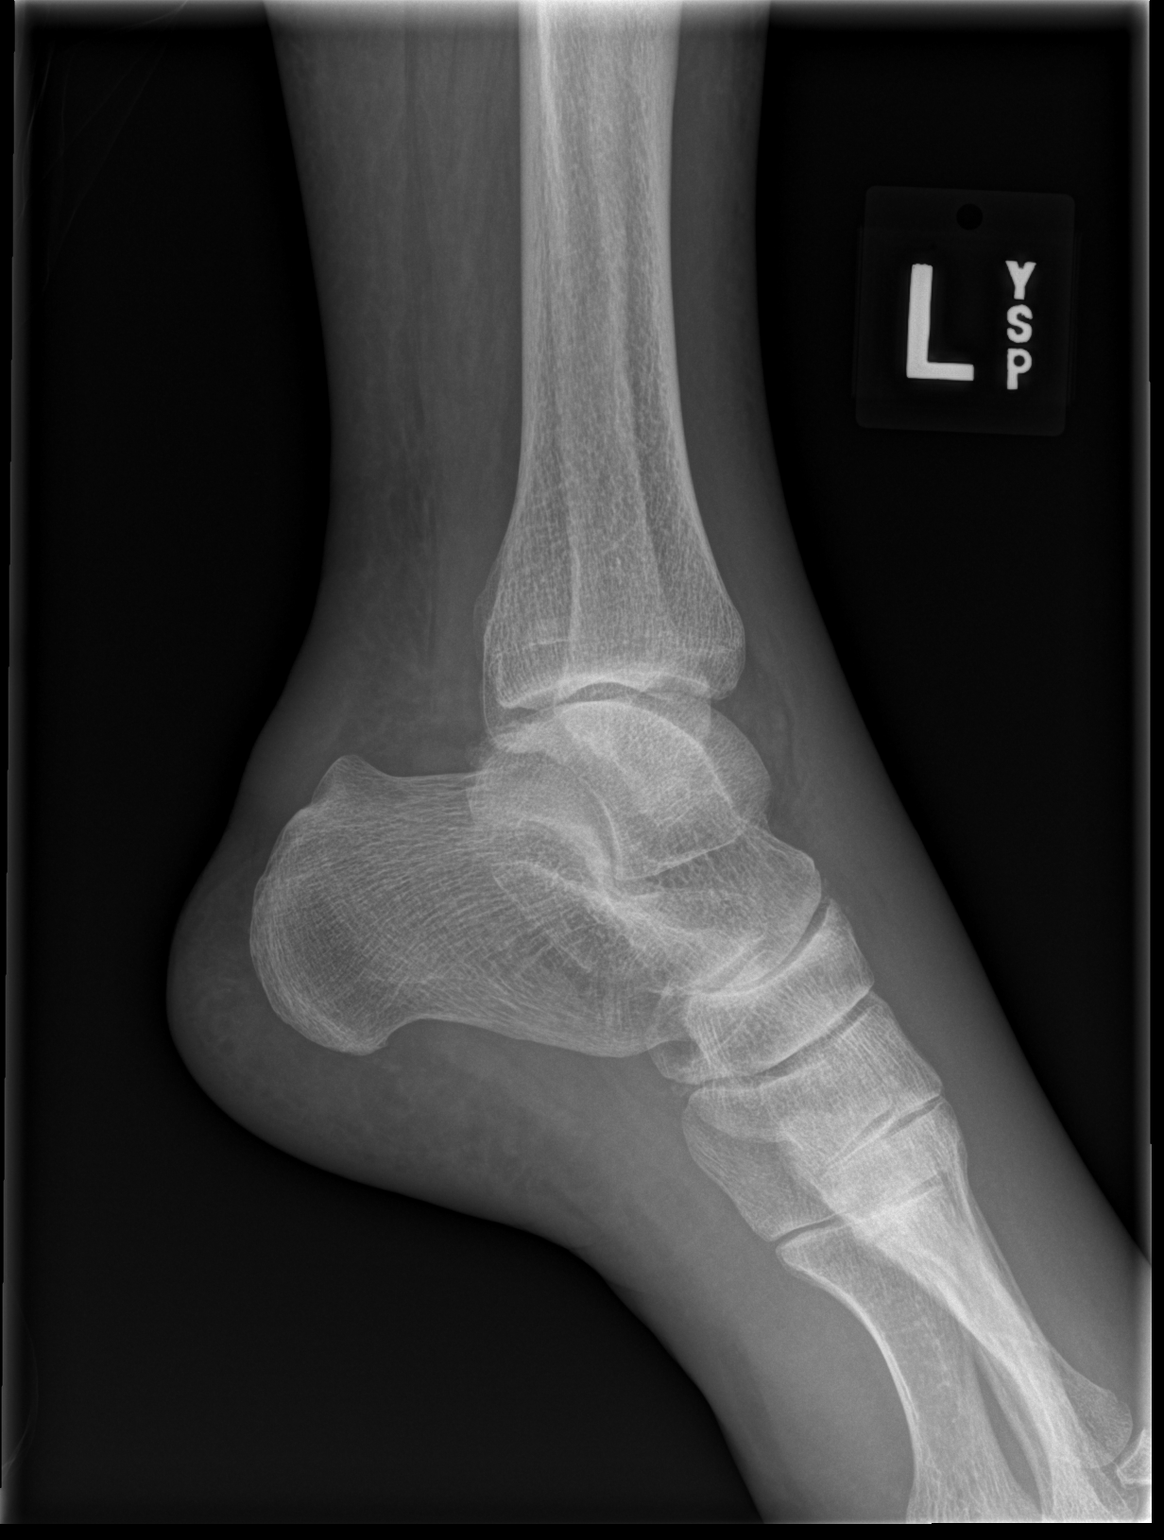

[x ankle ap left]
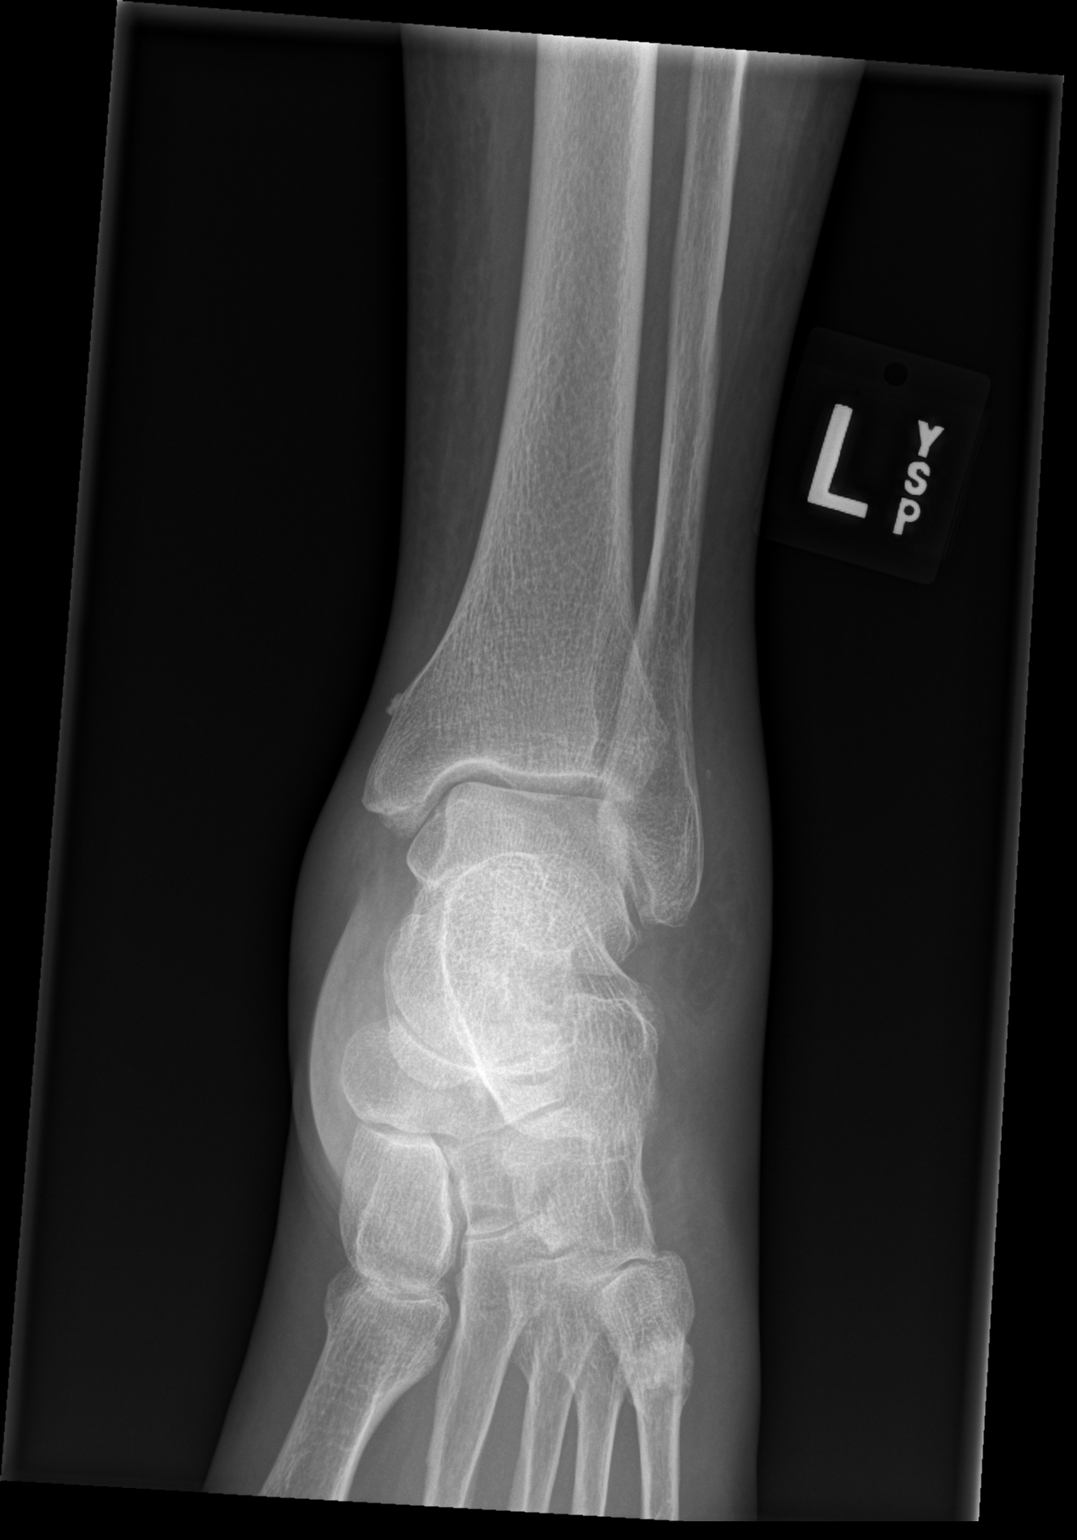

[x ankle obl left]
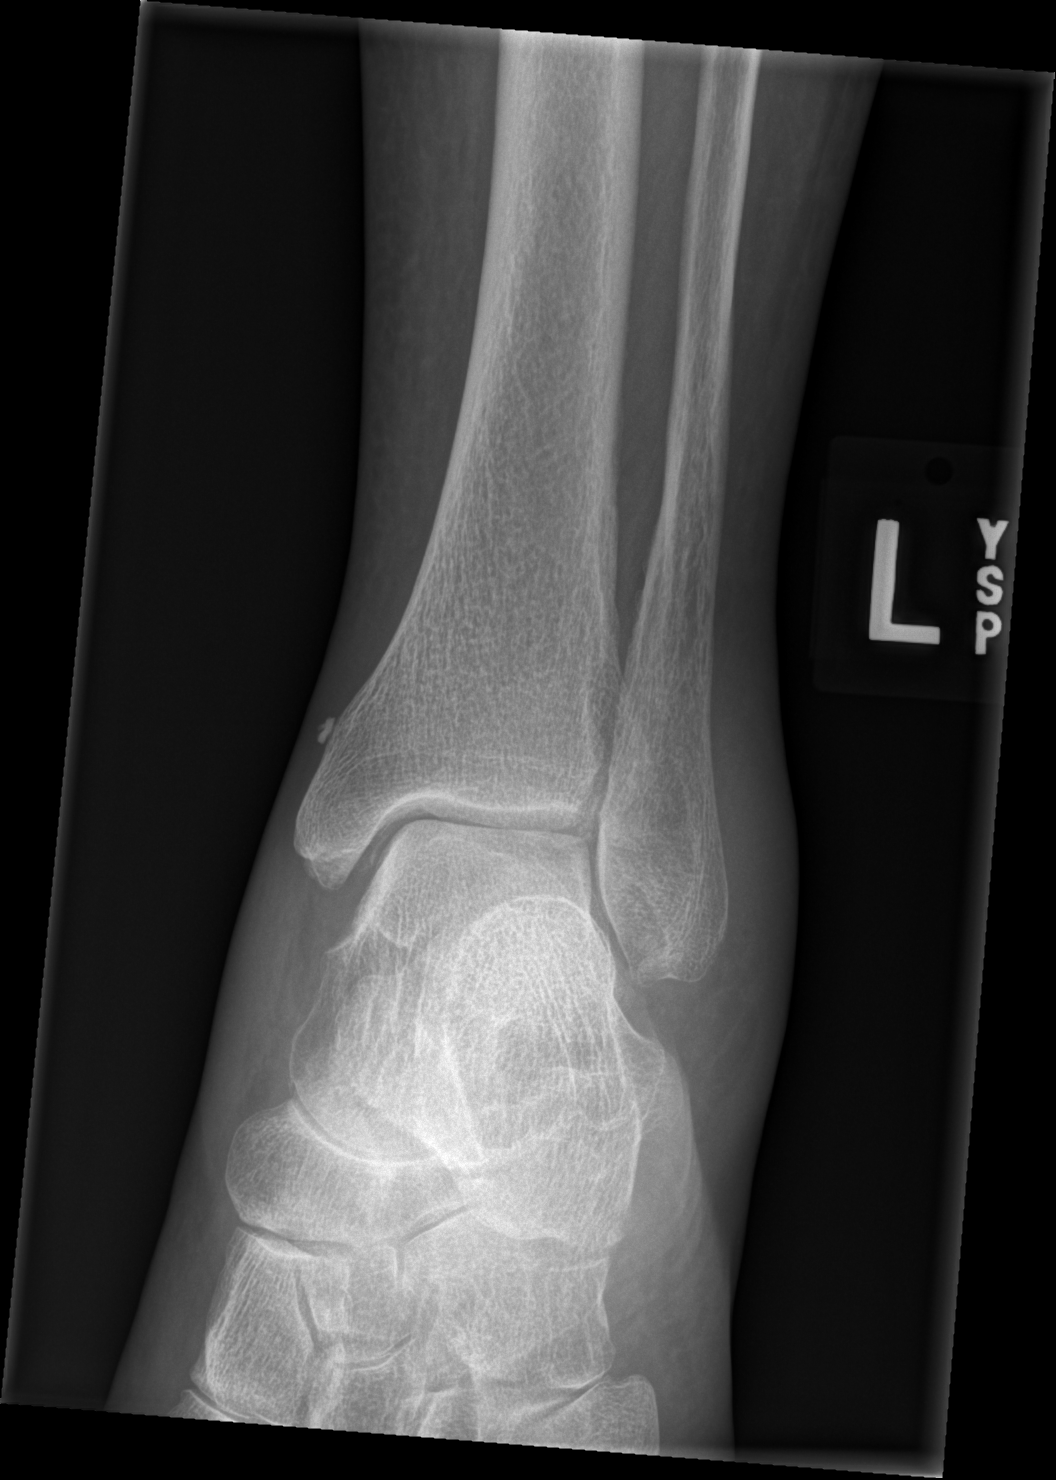

[3 of 3 positions shown; findings below may reference images not displayed]

FINDINGS: Frontal, oblique, and lateral views obtained. There is generalized
soft tissue swelling. There is no acute fracture or joint effusion.
There is mild generalized joint space narrowing. There is
calcification adjacent to the distal tibial metaphysis medially
which may represent residua of old trauma. There is mild
chondrocalcinosis within the ankle joint medially. Ankle mortise
appears intact. No erosive changes.
IMPRESSION: Areas of osteoarthritic change noted. There is soft tissue swelling.
Mild chondrocalcinosis is noted, a finding that may be seen with
osteoarthritis or with calcium pyrophosphate deposition disease.

No evident fracture.  Ankle mortise appears intact.

## 2020-07-03 IMAGING — CR DG CHEST 2V
2 series · 2 of 2 positions shown · non-contrast
Comparison: Radiograph December 13, 2017.

CLINICAL DATA: Hypoxia.

EXAM:
CHEST - 2 VIEW

[chest lat]
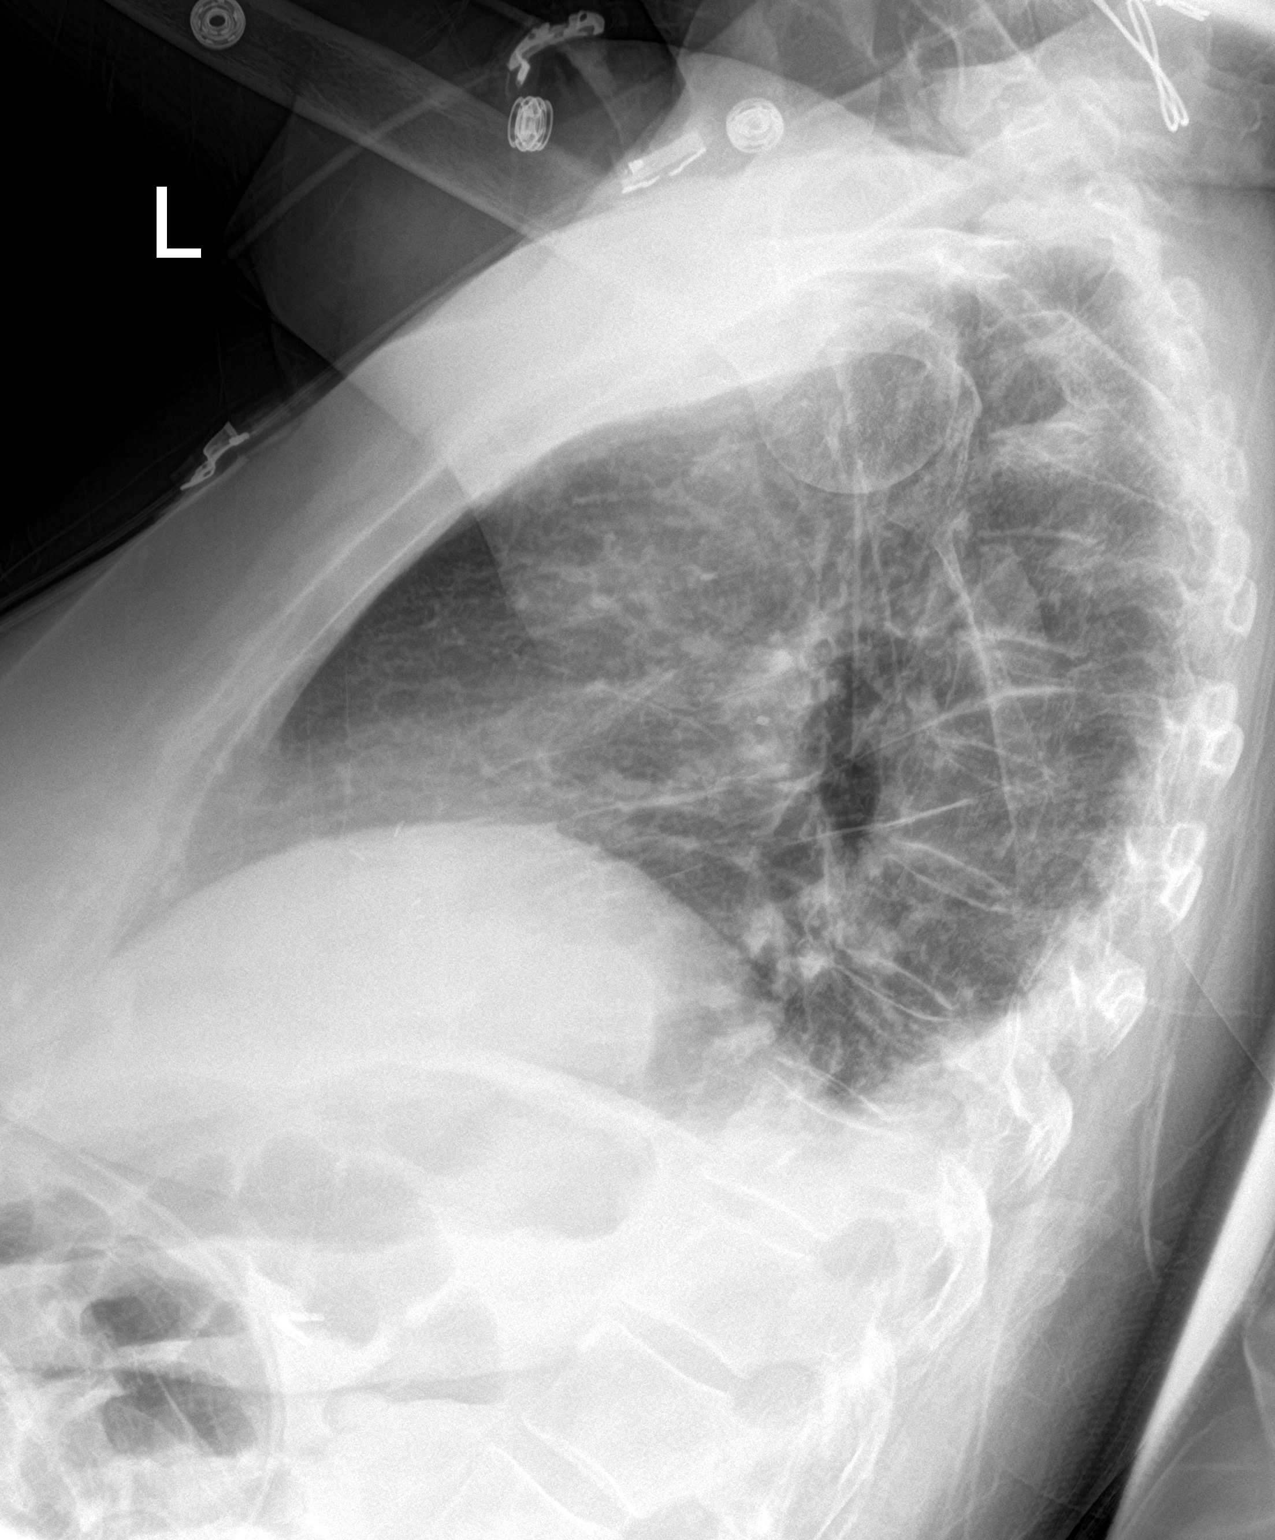

[chest ap]
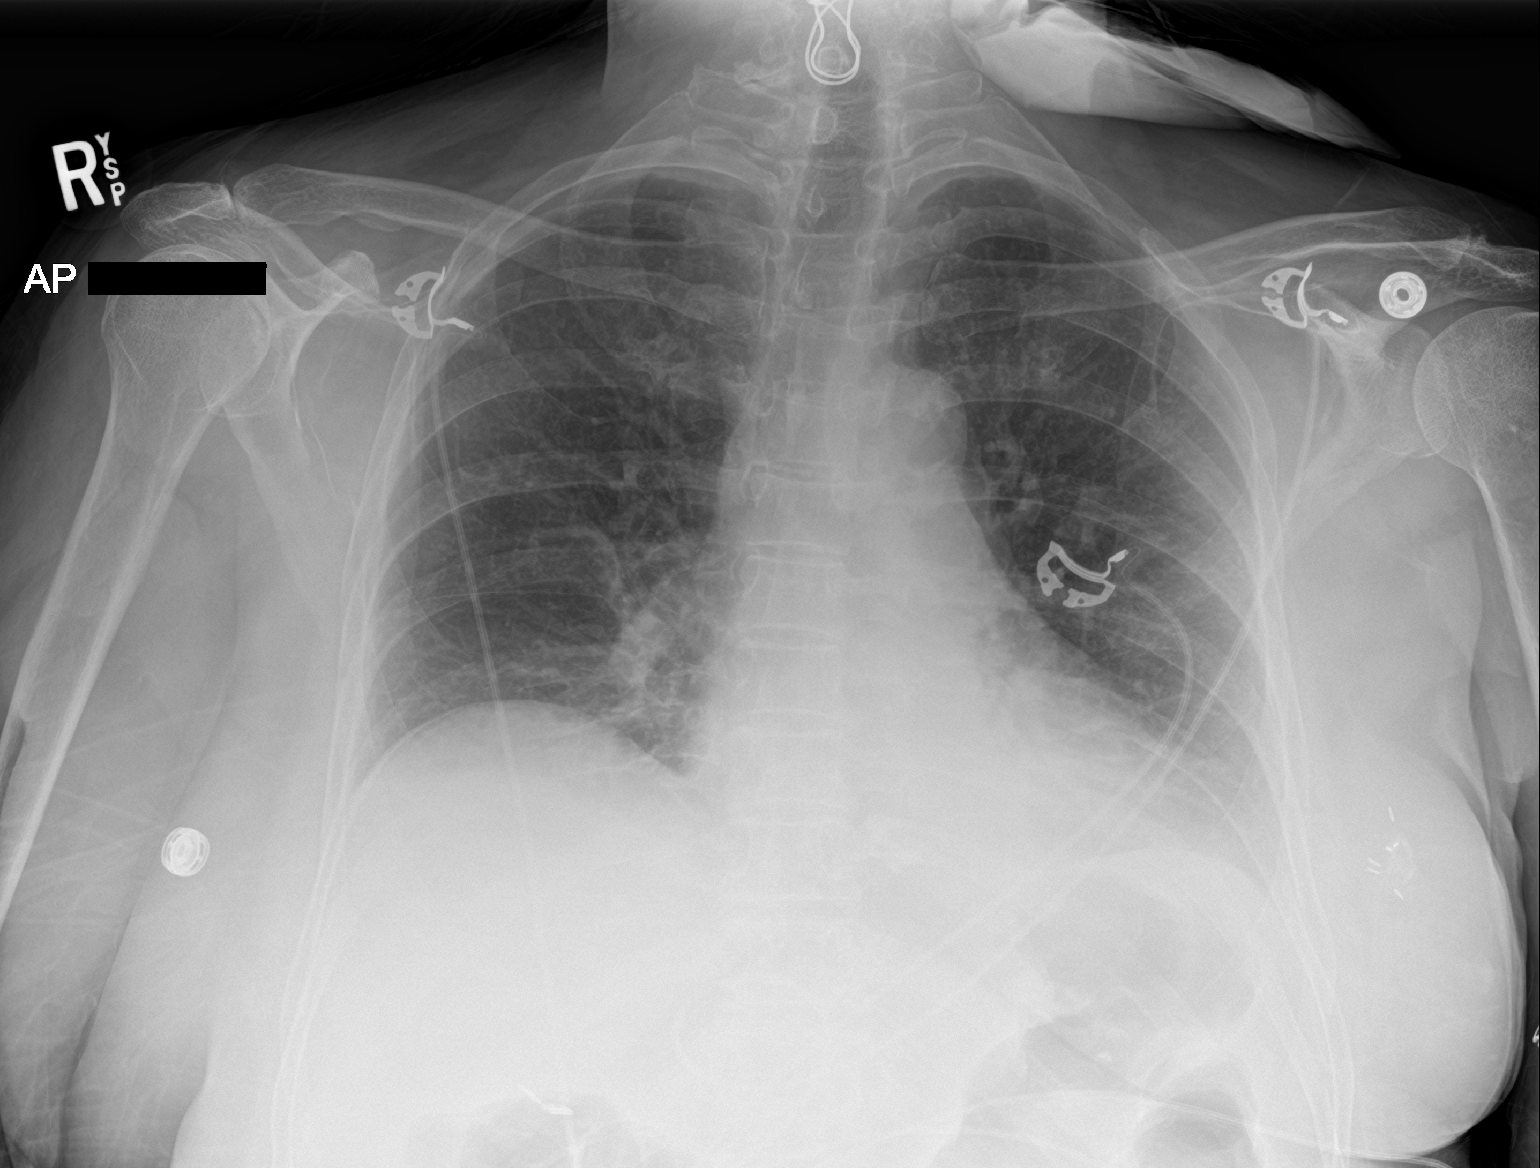

[2 of 2 positions shown; findings below may reference images not displayed]

FINDINGS: The heart size and mediastinal contours are within normal limits.
Minimal bibasilar subsegmental atelectasis is noted with small
pleural effusions. No pneumothorax is noted.. The visualized
skeletal structures are unremarkable.
IMPRESSION: Minimal bibasilar subsegmental atelectasis with small pleural
effusions.

## 2020-07-04 ENCOUNTER — Other Ambulatory Visit: Payer: Self-pay | Admitting: Critical Care Medicine

## 2020-07-05 DIAGNOSIS — E790 Hyperuricemia without signs of inflammatory arthritis and tophaceous disease: Secondary | ICD-10-CM | POA: Diagnosis not present

## 2020-07-05 DIAGNOSIS — R748 Abnormal levels of other serum enzymes: Secondary | ICD-10-CM | POA: Diagnosis not present

## 2020-07-05 DIAGNOSIS — N1831 Chronic kidney disease, stage 3a: Secondary | ICD-10-CM | POA: Diagnosis not present

## 2020-07-05 DIAGNOSIS — Z79899 Other long term (current) drug therapy: Secondary | ICD-10-CM | POA: Diagnosis not present

## 2020-07-05 DIAGNOSIS — M79643 Pain in unspecified hand: Secondary | ICD-10-CM | POA: Diagnosis not present

## 2020-07-05 DIAGNOSIS — M0609 Rheumatoid arthritis without rheumatoid factor, multiple sites: Secondary | ICD-10-CM | POA: Diagnosis not present

## 2020-07-05 DIAGNOSIS — M7989 Other specified soft tissue disorders: Secondary | ICD-10-CM | POA: Diagnosis not present

## 2020-07-05 DIAGNOSIS — M810 Age-related osteoporosis without current pathological fracture: Secondary | ICD-10-CM | POA: Diagnosis not present

## 2020-07-05 DIAGNOSIS — Z7952 Long term (current) use of systemic steroids: Secondary | ICD-10-CM | POA: Diagnosis not present

## 2020-07-06 DIAGNOSIS — L02413 Cutaneous abscess of right upper limb: Secondary | ICD-10-CM | POA: Diagnosis not present

## 2020-07-06 DIAGNOSIS — M79641 Pain in right hand: Secondary | ICD-10-CM | POA: Diagnosis not present

## 2020-07-06 DIAGNOSIS — M67441 Ganglion, right hand: Secondary | ICD-10-CM | POA: Diagnosis not present

## 2020-07-06 DIAGNOSIS — M7021 Olecranon bursitis, right elbow: Secondary | ICD-10-CM | POA: Diagnosis not present

## 2020-07-06 DIAGNOSIS — Z1159 Encounter for screening for other viral diseases: Secondary | ICD-10-CM | POA: Diagnosis not present

## 2020-07-07 ENCOUNTER — Ambulatory Visit (INDEPENDENT_AMBULATORY_CARE_PROVIDER_SITE_OTHER): Payer: Medicare PPO

## 2020-07-07 DIAGNOSIS — Z0000 Encounter for general adult medical examination without abnormal findings: Secondary | ICD-10-CM | POA: Diagnosis not present

## 2020-07-07 NOTE — Progress Notes (Signed)
Subjective:   Ebony Scott is a 70 y.o. female who presents for Medicare Annual (Subsequent) preventive examination.  I connected with Sherral Hammers today by telephone and verified that I am speaking with the correct person using two identifiers. Location patient: home Location provider: work Persons participating in the virtual visit: patient, provider.   I discussed the limitations, risks, security and privacy concerns of performing an evaluation and management service by telephone and the availability of in person appointments. I also discussed with the patient that there may be a patient responsible charge related to this service. The patient expressed understanding and verbally consented to this telephonic visit.    Interactive audio and video telecommunications were attempted between this provider and patient, however failed, due to patient having technical difficulties OR patient did not have access to video capability.  We continued and completed visit with audio only.      Review of Systems    N/A  Cardiac Risk Factors include: advanced age (>14men, >61 women);hypertension;dyslipidemia     Objective:    Today's Vitals   07/07/20 1038  PainSc: 4    There is no height or weight on file to calculate BMI.  Advanced Directives 07/07/2020 04/05/2020 02/16/2020 02/10/2020 04/20/2019 03/06/2018 03/06/2018  Does Patient Have a Medical Advance Directive? Yes Yes Yes Yes Yes Yes Yes  Type of Paramedic of [place];Living will [place];Living will [place];Living will [place];Living will [place];Living will [place];Living will [place];Living will  Does patient want to make changes to medical advance directive? No - Patient declined - No - Patient declined - - No - Patient declined -  Copy of [place] in Chart? No - copy  requested Yes - validated most recent copy scanned in chart (See row information) No - copy requested - Yes - validated most recent copy scanned in chart (See row information) No - copy requested -  Would patient like information on creating a medical advance directive? - - - - - - -  Pre-existing out of facility DNR order (yellow form or pink MOST form) - - - - - - -    Current Medications (verified) Outpatient Encounter Medications as of 07/07/2020  Medication Sig  . acetaminophen (TYLENOL) 650 MG CR tablet Take 1,300 mg by mouth every 8 (eight) hours as needed for pain.  Marland Kitchen albuterol (PROVENTIL) (2.5 MG/3ML) 0.083% nebulizer solution Take 3 mLs (2.5 mg total) by nebulization in the morning and at bedtime. Along with flutter valve  . albuterol (VENTOLIN HFA) 108 (90 Base) MCG/ACT inhaler INHALE 2 PUFFS BY MOUTH INTO THE LUNGS EVERY 4 HOURS AS NEEDED FOR WHEEZING OR SHORTNESS OF BREATH  . allopurinol (ZYLOPRIM) 100 MG tablet Take 100 mg by mouth daily.  . Alpha-D-Galactosidase (BEANO PO) Take 2-3 capsules by mouth 3 (three) times daily with meals.  . Alpha-Lipoic Acid 600 MG CAPS Take 600 mg by mouth daily.   Marland Kitchen ammonium lactate (AMLACTIN) 12 % cream Apply topically as needed for dry skin. (Patient taking differently: Apply 1 g topically at bedtime.)  . atorvastatin (LIPITOR) 10 MG tablet TAKE 1 TABLET BY MOUTH DAILY  . azelastine (ASTELIN) 0.1 % nasal spray USE 2 SPRAYS IN EACH NOSTRIL DAILY AS DIRECTED (Patient taking differently: Place 2 sprays into both nostrils daily as needed for allergies.)  . benzonatate (TESSALON) 200 MG capsule TAKE 1 CAPSULE BY MOUTH TWICE  DAILY AS NEEDED FOR COUGH  . CALCIUM-MAGNESIUM PO Take 1 tablet by mouth 2 (two) times daily.  . cetirizine (ZYRTEC) 10 MG tablet Take 10 mg by mouth daily.  . cholecalciferol (VITAMIN D) 25 MCG (1000 UT) tablet Take 1,000 Units by mouth daily.   . cyproheptadine (PERIACTIN) 4 MG tablet TAKE 2 TABLETS BY MOUTH EVERY EVENING (Patient  taking differently: Take 8 mg by mouth every evening.)  . denosumab (PROLIA) 60 MG/ML SOLN injection Inject 60 mg into the skin every 6 (six) months. Administer in upper arm, thigh, or abdomen  . diclofenac Sodium (VOLTAREN) 1 % GEL Apply 2 g topically 4 (four) times daily as needed (joint pain).  . famotidine (PEPCID) 20 MG tablet TAKE 1 TABLET BY MOUTH TWICE A DAY (Patient taking differently: Take 20 mg by mouth 2 (two) times daily.)  . fluticasone (FLONASE) 50 MCG/ACT nasal spray USE 2 SPRAYS IN EACH NOSTRIL DAILY  . folic acid (FOLVITE) 1 MG tablet Take 1 mg by mouth daily.  . furosemide (LASIX) 20 MG tablet Take 1 tablet (20 mg total) by mouth every evening. Take 40 mg in the AM, and take additional 20 mg in the PM for swelling (Patient taking differently: Take 20 mg by mouth 2 (two) times daily. Take 40 mg in the AM, and take additional 20 mg in the PM for swelling)  . furosemide (LASIX) 40 MG tablet Take 1 tablet (40 mg total) by mouth daily as needed.  . gabapentin (NEURONTIN) 600 MG tablet TAKE 1 TABLET BY MOUTH DAILY (Patient taking differently: Take 600 mg by mouth at bedtime.)  . guaiFENesin (MUCINEX) 600 MG 12 hr tablet Take 1 tablet (600 mg total) by mouth 2 (two) times daily as needed for cough or to loosen phlegm.  . hydrocortisone 2.5 % cream Apply topically 2 (two) times daily as needed (hemorrhoids).  . Lactase 9000 units TABS Take 18,000-27,000 Units by mouth 4 (four) times daily as needed (dairy consumption).  Marland Kitchen lidocaine (LIDODERM) 5 % Place 1 patch onto the skin daily as needed (pain). Remove & Discard patch within 12 hours or as directed by MD  . lidocaine (LIDODERM) 5 % APPLY 1 PATCH TOPICALLY AS DIRECTED  . magnesium oxide (MAG-OX) 400 MG tablet Take 400 mg by mouth daily.  . mirtazapine (REMERON SOL-TAB) 30 MG disintegrating tablet DISSOLVE 1 TABLET BY MOUTH EVERY NIGHT AT BEDTIME  . MITIGARE 0.6 MG CAPS Take 0.6 mg by mouth every other day.  . montelukast (SINGULAIR) 10  MG tablet TAKE 1 TABLET BY MOUTH DAILY (Patient taking differently: Take 10 mg by mouth every evening.)  . mupirocin ointment (BACTROBAN) 2 % APPLY TO BED SORES AS NEEDED  . nystatin (MYCOSTATIN) 100000 UNIT/ML suspension SWISH AND SPIT 5 MLS TWICE DAILY (Patient taking differently: Use as directed 5 mLs in the mouth or throat at bedtime.)  . nystatin-triamcinolone ointment (MYCOLOG) Apply topically 2 (two) times daily. (Patient taking differently: Apply 1 application topically 2 (two) times daily as needed (yeast infections).)  . predniSONE (DELTASONE) 5 MG tablet Take 15 mg by mouth daily with breakfast.  . Prenatal Vit-Fe Fumarate-FA (PRENATAL MULTIVITAMIN) TABS tablet Take 1 tablet by mouth daily.  . Probiotic Product (ALIGN PO) Take 1 capsule by mouth daily.  Marland Kitchen Propylene Glycol (SYSTANE COMPLETE) 0.6 % SOLN Place 1 drop into both eyes 2 (two) times daily as needed (dry eyes).  Marland Kitchen Respiratory Therapy Supplies (FLUTTER) DEVI Use as directed  . Salicylic Acid-Urea (KERASAL EX) Apply 1  application topically at bedtime.  . sodium chloride HYPERTONIC 3 % nebulizer solution Take by nebulization 2 (two) times daily.  Marland Kitchen Spacer/Aero-Holding Chambers (AEROCHAMBER PLUS) inhaler Use as instructed  . Tiotropium Bromide-Olodaterol (STIOLTO RESPIMAT) 2.5-2.5 MCG/ACT AERS Inhale 2 puffs into the lungs daily.  Alveda Reasons 20 MG TABS tablet TAKE 1 TABLET BY MOUTH DAILY WITH SUPPER (Patient taking differently: Take 20 mg by mouth every evening.)  . Calcium Citrate (CITRACAL PO) Take 1 tablet by mouth in the morning and at bedtime.  Marland Kitchen COVID-19 mRNA vaccine, Pfizer, 30 MCG/0.3ML injection INJECT AS DIRECTED  . DEXILANT 60 MG capsule TAKE 1 CAPSULE BY MOUTH EACH MORNING (Patient taking differently: Take 60 mg by mouth daily.)  . EPINEPHrine (EPIPEN 2-PAK) 0.3 mg/0.3 mL IJ SOAJ injection Inject 0.3 mLs (0.3 mg total) into the muscle Once PRN. (Patient not taking: Reported on 07/07/2020)  . Nystatin (GERHARDT'S BUTT  CREAM) CREA Apply 1 application topically daily as needed (hemorrhoids). NYSTATIN 100,000 UNIT/GM CR 100000 CRM- 60 g, ZINC OXIDE 20 % OINT 20 OINT- 60 g, HYDROCORTISONE 1 % CREAM- 60 g (Patient not taking: Reported on 07/07/2020)  . Nystatin (GERHARDT'S BUTT CREAM) CREA Apply to the affected areas daily as needed for hemorrhoids (Patient not taking: Reported on 07/07/2020)  . nystatin cream (MYCOSTATIN) Apply 1 application topically 2 (two) times daily. (Patient not taking: Reported on 07/07/2020)  . ondansetron (ZOFRAN) 4 MG tablet Take 1 tablet (4 mg total) by mouth every 8 (eight) hours as needed for nausea or vomiting. (Patient not taking: Reported on 07/07/2020)   Facility-Administered Encounter Medications as of 07/07/2020  Medication  . denosumab (PROLIA) injection 60 mg    Allergies (verified) Dust mite extract, Molds & smuts, Morphine and related, Other, Penicillins, Reclast [zoledronic acid], Rofecoxib, Shellfish allergy, Tetracycline hcl, Xolair [omalizumab], Dilaudid [hydromorphone hcl], Hydrocodone-acetaminophen, Levofloxacin, Oxycodone hcl, Paroxetine, Baclofen, Celecoxib, Diltiazem, Lactose intolerance (gi), and Tree extract   History: Past Medical History:  Diagnosis Date  . Acute deep vein thrombosis (DVT) of right lower extremity ([place]) 12/13/2017  . Allergic rhinitis   . Allergy    SEASONAL  . Anemia   . Angio-edema   . Anxiety    pt denies  . Arthritis    Phreesia 10/21/2019  . Asthma   . Asthma    Phreesia 10/21/2019  . Breast cancer ([facility]) 1998   in remission  . Cataract    REMOVED  . Clostridium difficile colitis 01/14/2018  . Clostridium difficile diarrhea 01/14/2018  . Complication of anesthesia    "had hard time waking up from it several times" (02/20/2012)  . Depression    "some; don't take anything for it" (02/20/2012)  . Diverticulosis   . DVT (deep venous thrombosis) ([place])   . Exertional dyspnea   . Fibromyalgia 11/2011  . GERD (gastroesophageal reflux  disease)   . Graves disease   . Headache(784.0)    "related to allergies; more at different times during the year" (02/20/2012)  . Hemorrhoids   . Hiatal hernia    back and neck  . Hx of adenomatous colonic polyps 04/12/2016  . Hypercholesteremia    good cholesterol is high  . Hypothyroidism   . IBS (irritable bowel syndrome)   . Moderate persistent asthma    -FeV1 72% 2011, -IgE 102 2011, CT sinus Neg 2011  . Osteoporosis    on reclast yearly  . Peripheral vascular disease ([place]) 2019   DVTs  . Pneumonia 04/2011; ~ 11/2011   "double; single" (02/20/2012)  .  Recurrent upper respiratory infection (URI)   . Seronegative rheumatoid arthritis ([place])    Dr. Lahoma Rocker  . SIRS (systemic inflammatory response syndrome) ([place]) 02/10/2018  . Tracheobronchomalacia    Past Surgical History:  Procedure Laterality Date  . ABDOMINAL HYSTERECTOMY N/A    Phreesia 10/21/2019  . ANTERIOR AND POSTERIOR REPAIR  1990's  . APPENDECTOMY    . BREAST LUMPECTOMY  1998   left  . BREAST SURGERY N/A    Phreesia 10/21/2019  . BRONCHIAL WASHINGS  04/05/2020   Procedure: BRONCHIAL WASHINGS;  Surgeon: Freddi Starr, MD;  Location: [facility];  Service: Pulmonary;;  . CARPOMETACARPEL ([place]) FUSION OF THUMB WITH AUTOGRAFT FROM RADIUS  ~ 2009   "both thumbs" (02/20/2012)  . CATARACT EXTRACTION W/ INTRAOCULAR LENS  IMPLANT, BILATERAL  2012  . CERVICAL DISCECTOMY  10/2001   C5-C6  . CERVICAL FUSION  2003   C3-C4  . CHOLECYSTECTOMY    . COLONOSCOPY    . DEBRIDEMENT TENNIS ELBOW  ?1970's   right  . ESOPHAGOGASTRODUODENOSCOPY    . EYE SURGERY N/A    Phreesia 10/21/2019  . HYSTERECTOMY    . KNEE ARTHROPLASTY  ?1990's   "?right; w/cartilage repair" (02/20/2012)  . NASAL SEPTUM SURGERY  1980's  . POSTERIOR CERVICAL FUSION/FORAMINOTOMY  2004   "failed initial fusion; rewired  anterior neck" (02/20/2012)  . REVERSE SHOULDER ARTHROPLASTY Right 02/16/2020   Procedure: REVERSE SHOULDER ARTHROPLASTY;  Surgeon:  Marchia Bond, MD;  Location: [facility];  Service: Orthopedics;  Laterality: Right;  . SPINE SURGERY N/A    Phreesia 10/21/2019  . TONSILLECTOMY  ~ 1953  . VESICOVAGINAL FISTULA CLOSURE W/ TAH  1988  . VIDEO BRONCHOSCOPY Bilateral 08/23/2016   Procedure: VIDEO BRONCHOSCOPY WITH FLUORO;  Surgeon: Javier Glazier, MD;  Location: [facility];  Service: Cardiopulmonary;  Laterality: Bilateral;  . VIDEO BRONCHOSCOPY N/A 04/05/2020   Procedure: VIDEO BRONCHOSCOPY WITHOUT FLUORO;  Surgeon: Freddi Starr, MD;  Location: [facility];  Service: Pulmonary;  Laterality: N/A;   Family History  Problem Relation Age of Onset  . Allergies Mother   . Heart disease Mother   . Arthritis Mother   . Lung cancer Mother   . Diabetes Mother   . Allergies Father   . Heart disease Father   . Arthritis Father   . Stroke Father   . Colon cancer Other        Maternal half aunt/Maternal half uncle  . Colitis Daughter   . Diabetes Maternal Grandfather    Social History   Socioeconomic History  . Marital status: Divorced    Spouse name: Not on file  . Number of children: 2  . Years of education: college  . Highest education level: Not on file  Occupational History  . Occupation: Disabled    Comment: Retired Engineer, production: RETIRED  Tobacco Use  . Smoking status: Passive Smoke Exposure - Never Smoker  . Smokeless tobacco: Never Used  . Tobacco comment: Parents  Vaping Use  . Vaping Use: Never used  Substance and Sexual Activity  . Alcohol use: Not Currently    Alcohol/week: 0.0 standard drinks  . Drug use: No  . Sexual activity: Never  Other Topics Concern  . Not on file  Social History Narrative   Patient lives at home alone. Patient  divorced.    Patient has her BS degree.   Right handed.   Caffeine- sometimes coffee.      [facility]:  Born in [place], [place]. She worked as a Copywriter, advertising. She has no pets currently. She does have indoor plants.  Previously had mold in her home that was remediated. Carpet was removed.          Social Determinants of Health   Financial Resource Strain: Low Risk   . Difficulty of Paying Living Expenses: Not hard at all  Food Insecurity: No Food Insecurity  . Worried About Charity fundraiser in the Last Year: Never true  . Ran Out of Food in the Last Year: Never true  Transportation Needs: No Transportation Needs  . Lack of Transportation (Medical): No  . Lack of Transportation (Non-Medical): No  Physical Activity: Inactive  . Days of Exercise per Week: 0 days  . Minutes of Exercise per Session: 0 min  Stress: No Stress Concern Present  . Feeling of Stress : Not at all  Social Connections: Moderately Isolated  . Frequency of Communication with Friends and Family: More than three times a week  . Frequency of Social Gatherings with Friends and Family: More than three times a week  . Attends Religious Services: Never  . Active Member of Clubs or Organizations: Yes  . Attends Archivist Meetings: Never  . Marital Status: Divorced    Tobacco Counseling Counseling given: Not Answered Comment: Parents   Clinical Intake:  Pre-visit preparation completed: Yes  Pain : 0-10 Pain Score: 4  Pain Type: Acute pain,Chronic pain Pain Location: Arm Pain Orientation: Right Pain Descriptors / Indicators: Aching,Sharp Pain Onset: More than a month ago Pain Frequency: Constant Pain Relieving Factors: Voltaren  Pain Relieving Factors: Voltaren  Nutritional Risks: None Diabetes: No  How often do you need to have someone help you when you read instructions, pamphlets, or other written materials from your doctor or pharmacy?: 1 - Never  Diabetic?No   Interpreter Needed?: No  Information entered by :: [place] of Daily Living In your present state of health, do you have any difficulty performing the following activities: 07/07/2020 02/16/2020  Hearing? N N  Vision? N  N  Difficulty concentrating or making decisions? N N  Walking or climbing stairs? Y Y  Comment - -  Dressing or bathing? N N  Doing errands, shopping? Y N  Preparing Food and eating ? N -  Using the Toilet? N -  In the past six months, have you accidently leaked urine? Y -  Do you have problems with loss of bowel control? N -  Managing your Medications? N -  Managing your Finances? N -  Housekeeping or managing your Housekeeping? N -  Some recent data might be hidden    Patient Care Team: [name], MD as PCP - General (Family Medicine) Buford Dresser, MD as PCP - Cardiology (Cardiology)  Indicate any recent Medical Services you may have received from other than [facility] providers in the past year (date may be approximate).     Assessment:   This is a routine wellness examination for Juri.  Hearing/Vision screen  Hearing Screening   125Hz  250Hz  500Hz  1000Hz  2000Hz  3000Hz  4000Hz  6000Hz  8000Hz   Right ear:           Left ear:           Vision Screening Comments: Patient states she gets her eyes examined every year. Patient currently wears glasses   Dietary issues and exercise activities discussed: Current Exercise Habits: The patient does not participate in regular exercise at present, Exercise limited  by: respiratory conditions(s)  Goals Addressed            This Visit's Progress   . Patient Stated       I would like to be able to walk better.    . Prevent falls        Depression Screen PHQ 2/9 Scores 07/07/2020 03/27/2018 02/25/2017 08/20/2016 08/20/2016 04/25/2016 06/03/2015  PHQ - 2 Score 0 0 2 0 1 0 3  PHQ- 9 Score - - 9 - 2 0 9    Fall Risk Fall Risk  07/07/2020 03/21/2020 03/27/2018 02/25/2017 08/20/2016  Falls in the past year? 1 1 1  No -  Number falls in past yr: 1 1 1  - -  Injury with Fall? 0 1 1 - -  Risk for fall due to : History of fall(s);Impaired balance/gait - - - History of fall(s)  Follow up Falls evaluation completed;Falls prevention discussed - - -  -    FALL RISK PREVENTION PERTAINING TO THE HOME:  Any stairs in or around the home? Yes  If so, are there any without handrails? No  Home free of loose throw rugs in walkways, pet beds, electrical cords, etc? Yes  Adequate lighting in your home to reduce risk of falls? Yes   ASSISTIVE DEVICES UTILIZED TO PREVENT FALLS:  Life alert? No  Use of a cane, walker or w/c? Yes  Grab bars in the bathroom? Yes  Shower chair or bench in shower? Yes  Elevated toilet seat or a handicapped toilet? Yes    Cognitive Function:   Normal cognitive status assessed by direct observation by this Nurse Health Advisor. No abnormalities found.        Immunizations Immunization History  Administered Date(s) Administered  . DTaP 08/18/2013  . Fluad Quad(high Dose 65+) 10/16/2018, 11/20/2019  . Hepatitis A 09/04/2007, 03/02/2008  . Hepatitis B 01/07/1985, 02/06/1985, 08/17/1985  . Influenza Split 11/13/2010, 11/22/2011, 10/20/2012  . Influenza Whole 11/14/2009, 11/21/2011  . Influenza, High Dose Seasonal PF 11/09/2015, 11/27/2017  . Influenza, Quadrivalent, Recombinant, Inj, Pf 11/20/2019  . Influenza,inj,Quad PF,6+ Mos 11/06/2013, 11/09/2014, 11/06/2016  . Meningococcal Conjugate 09/04/2007  . PFIZER(Purple Top)SARS-COV-2 Vaccination 03/12/2019, 04/01/2019, 12/16/2019, 06/29/2020  . Pneumococcal Conjugate-13 05/18/2013  . Pneumococcal Polysaccharide-23 01/05/1994, 11/28/2011, 05/27/2017  . Td 07/24/1995, 03/16/2005  . Tdap 08/18/2013  . Zoster 03/02/2008  . Zoster Recombinat (Shingrix) 09/18/2017    TDAP status: Up to date  Flu Vaccine status: Up to date  Pneumococcal vaccine status: Completed during today's visit.  Covid-19 vaccine status: Completed vaccines  Qualifies for Shingles Vaccine? Yes   Zostavax completed Yes   Shingrix Completed?: Yes  Screening Tests Health Maintenance  Topic Date Due  . MAMMOGRAM  10/06/2019  . INFLUENZA VACCINE  09/19/2020  . COLONOSCOPY (Pts  45-69yrs Insurance coverage will need to be confirmed)  04/04/2021  . TETANUS/TDAP  08/19/2023  . DEXA SCAN  Completed  . COVID-19 Vaccine  Completed  . Hepatitis C Screening  Completed  . PNA vac Low Risk Adult  Completed  . HPV VACCINES  Aged Out    Health Maintenance  Health Maintenance Due  Topic Date Due  . MAMMOGRAM  10/06/2019    Colorectal cancer screening: Type of screening: Colonoscopy. Completed 04/04/2016. Repeat every 5 years  Mammogram status: Ordered 07/07/2020. Pt provided with contact info and advised to call to schedule appt.   Bone Density status: Completed 10/01/2018. Results reflect: Bone density results: NORMAL. Repeat every 10/01/2018 years.  Lung Cancer Screening: (Low Dose  CT Chest recommended if Age 8-80 years, 30 pack-year currently smoking OR have quit w/in 15years.) does not qualify.   Lung Cancer Screening Referral: N/A   Additional Screening:  Hepatitis C Screening: does qualify; Completed 01/21/2018  Vision Screening: Recommended annual ophthalmology exams for early detection of glaucoma and other disorders of the eye. Is the patient up to date with their annual eye exam?  Yes  Who is the provider or what is the name of the office in which the patient attends annual eye exams? Dr. Gordan Payment  If pt is not established with a provider, would they like to be referred to a provider to establish care? No .   Dental Screening: Recommended annual dental exams for proper oral hygiene  Community Resource Referral / Chronic Care Management: CRR required this visit?  No   CCM required this visit?  No      Plan:     I have personally reviewed and noted the following in the patient's chart:   . Medical and social history . Use of alcohol, tobacco or illicit drugs  . Current medications and supplements including opioid prescriptions.  . Functional ability and status . Nutritional status . Physical activity . Advanced directives . List of  other physicians . Hospitalizations, surgeries, and ER visits in previous 12 months . Vitals . Screenings to include cognitive, depression, and falls . Referrals and appointments  In addition, I have reviewed and discussed with patient certain preventive protocols, quality metrics, and best practice recommendations. A written personalized care plan for preventive services as well as general preventive health recommendations were provided to patient.     Ofilia Neas, LPN   [id number]   Nurse Notes: None

## 2020-07-07 NOTE — Patient Instructions (Signed)
Ebony Scott , Thank you for taking time to come for your Medicare Wellness Visit. I appreciate your ongoing commitment to your health goals. Please review the following plan we discussed and let me know if I can assist you in the future.   Screening recommendations/referrals: Colonoscopy: Up to date, next due 04/02/2021 Mammogram: Up to date, please have [place] fax over your most recent mammogram Bone Density: Up to date, next due 09/30/2020 Recommended yearly ophthalmology/optometry visit for glaucoma screening and checkup Recommended yearly dental visit for hygiene and checkup  Vaccinations: Influenza vaccine: Up to date, next due fall 2022  Pneumococcal vaccine: Completed series  Tdap vaccine: Up to date, next due 08/19/2023 Shingles vaccine: Currently due for your second shingrix, please let us know when you come off of prednisone     Advanced directives: Please bring in a copy of your advanced medical directives so that we may scan into your chart.  Conditions/risks identified: None  Next appointment: None    Preventive Care 65 Years and Older, Female Preventive care refers to lifestyle choices and visits with your health care provider that can promote health and wellness. What does preventive care include?  A yearly physical exam. This is also called an annual well check.  Dental exams once or twice a year.  Routine eye exams. Ask your health care provider how often you should have your eyes checked.  Personal lifestyle choices, including:  Daily care of your teeth and gums.  Regular physical activity.  Eating a healthy diet.  Avoiding tobacco and drug use.  Limiting alcohol use.  Practicing safe sex.  Taking low-dose aspirin every day.  Taking vitamin and mineral supplements as recommended by your health care provider. What happens during an annual well check? The services and screenings done by your health care provider during your annual well check will depend  on your age, overall health, lifestyle risk factors, and family history of disease. Counseling  Your health care provider may ask you questions about your:  Alcohol use.  Tobacco use.  Drug use.  Emotional well-being.  Home and relationship well-being.  Sexual activity.  Eating habits.  History of falls.  Memory and ability to understand (cognition).  Work and work Statistician.  Reproductive health. Screening  You may have the following tests or measurements:  Height, weight, and BMI.  Blood pressure.  Lipid and cholesterol levels. These may be checked every 5 years, or more frequently if you are over 18 years old.  Skin check.  Lung cancer screening. You may have this screening every year starting at age 47 if you have a 30-pack-year history of smoking and currently smoke or have quit within the past 15 years.  Fecal occult blood test (FOBT) of the stool. You may have this test every year starting at age 69.  Flexible sigmoidoscopy or colonoscopy. You may have a sigmoidoscopy every 5 years or a colonoscopy every 10 years starting at age 57.  Hepatitis C blood test.  Hepatitis B blood test.  Sexually transmitted disease (STD) testing.  Diabetes screening. This is done by checking your blood sugar (glucose) after you have not eaten for a while (fasting). You may have this done every 1-3 years.  Bone density scan. This is done to screen for osteoporosis. You may have this done starting at age 52.  Mammogram. This may be done every 1-2 years. Talk to your health care provider about how often you should have regular mammograms. Talk with your health care provider  about your test results, treatment options, and if necessary, the need for more tests. Vaccines  Your health care provider may recommend certain vaccines, such as:  Influenza vaccine. This is recommended every year.  Tetanus, diphtheria, and acellular pertussis (Tdap, Td) vaccine. You may need a Td  booster every 10 years.  Zoster vaccine. You may need this after age 88.  Pneumococcal 13-valent conjugate (PCV13) vaccine. One dose is recommended after age 70.  Pneumococcal polysaccharide (PPSV23) vaccine. One dose is recommended after age 38. Talk to your health care provider about which screenings and vaccines you need and how often you need them. This information is not intended to replace advice given to you by your health care provider. Make sure you discuss any questions you have with your health care provider. Document Released: 03/04/2015 Document Revised: 10/26/2015 Document Reviewed: 12/07/2014 Elsevier Interactive Patient Education  2017 [employer] Prevention in the Home Falls can cause injuries. They can happen to people of all ages. There are many things you can do to make your home safe and to help prevent falls. What can I do on the outside of my home?  Regularly fix the edges of walkways and driveways and fix any cracks.  Remove anything that might make you trip as you walk through a door, such as a raised step or threshold.  Trim any bushes or trees on the path to your home.  Use bright outdoor lighting.  Clear any walking paths of anything that might make someone trip, such as rocks or tools.  Regularly check to see if handrails are loose or broken. Make sure that both sides of any steps have handrails.  Any raised decks and porches should have guardrails on the edges.  Have any leaves, snow, or ice cleared regularly.  Use sand or salt on walking paths during winter.  Clean up any spills in your garage right away. This includes oil or grease spills. What can I do in the bathroom?  Use night lights.  Install grab bars by the toilet and in the tub and shower. Do not use towel bars as grab bars.  Use non-skid mats or decals in the tub or shower.  If you need to sit down in the shower, use a plastic, non-slip stool.  Keep the floor dry. Clean up  any water that spills on the floor as soon as it happens.  Remove soap buildup in the tub or shower regularly.  Attach bath mats securely with double-sided non-slip rug tape.  Do not have throw rugs and other things on the floor that can make you trip. What can I do in the bedroom?  Use night lights.  Make sure that you have a light by your bed that is easy to reach.  Do not use any sheets or blankets that are too big for your bed. They should not hang down onto the floor.  Have a firm chair that has side arms. You can use this for support while you get dressed.  Do not have throw rugs and other things on the floor that can make you trip. What can I do in the kitchen?  Clean up any spills right away.  Avoid walking on wet floors.  Keep items that you use a lot in easy-to-reach places.  If you need to reach something above you, use a strong step stool that has a grab bar.  Keep electrical cords out of the way.  Do not use floor polish or  wax that makes floors slippery. If you must use wax, use non-skid floor wax.  Do not have throw rugs and other things on the floor that can make you trip. What can I do with my stairs?  Do not leave any items on the stairs.  Make sure that there are handrails on both sides of the stairs and use them. Fix handrails that are broken or loose. Make sure that handrails are as long as the stairways.  Check any carpeting to make sure that it is firmly attached to the stairs. Fix any carpet that is loose or worn.  Avoid having throw rugs at the top or bottom of the stairs. If you do have throw rugs, attach them to the floor with carpet tape.  Make sure that you have a light switch at the top of the stairs and the bottom of the stairs. If you do not have them, ask someone to add them for you. What else can I do to help prevent falls?  Wear shoes that:  Do not have high heels.  Have rubber bottoms.  Are comfortable and fit you well.  Are  closed at the toe. Do not wear sandals.  If you use a stepladder:  Make sure that it is fully opened. Do not climb a closed stepladder.  Make sure that both sides of the stepladder are locked into place.  Ask someone to hold it for you, if possible.  Clearly mark and make sure that you can see:  Any grab bars or handrails.  First and last steps.  Where the edge of each step is.  Use tools that help you move around (mobility aids) if they are needed. These include:  Canes.  Walkers.  Scooters.  Crutches.  Turn on the lights when you go into a dark area. Replace any light bulbs as soon as they burn out.  Set up your furniture so you have a clear path. Avoid moving your furniture around.  If any of your floors are uneven, fix them.  If there are any pets around you, be aware of where they are.  Review your medicines with your doctor. Some medicines can make you feel dizzy. This can increase your chance of falling. Ask your doctor what other things that you can do to help prevent falls. This information is not intended to replace advice given to you by your health care provider. Make sure you discuss any questions you have with your health care provider. Document Released: 12/02/2008 Document Revised: 07/14/2015 Document Reviewed: 03/12/2014 Elsevier Interactive Patient Education  2017 [employer].

## 2020-07-11 DIAGNOSIS — L02413 Cutaneous abscess of right upper limb: Secondary | ICD-10-CM | POA: Diagnosis not present

## 2020-07-11 DIAGNOSIS — M67441 Ganglion, right hand: Secondary | ICD-10-CM | POA: Diagnosis not present

## 2020-07-11 DIAGNOSIS — M7021 Olecranon bursitis, right elbow: Secondary | ICD-10-CM | POA: Diagnosis not present

## 2020-07-13 DIAGNOSIS — J471 Bronchiectasis with (acute) exacerbation: Secondary | ICD-10-CM | POA: Diagnosis not present

## 2020-07-13 DIAGNOSIS — G4733 Obstructive sleep apnea (adult) (pediatric): Secondary | ICD-10-CM | POA: Diagnosis not present

## 2020-07-14 DIAGNOSIS — G4736 Sleep related hypoventilation in conditions classified elsewhere: Secondary | ICD-10-CM | POA: Diagnosis not present

## 2020-07-14 DIAGNOSIS — I5032 Chronic diastolic (congestive) heart failure: Secondary | ICD-10-CM | POA: Diagnosis not present

## 2020-07-14 DIAGNOSIS — R0602 Shortness of breath: Secondary | ICD-10-CM | POA: Diagnosis not present

## 2020-07-14 DIAGNOSIS — G4733 Obstructive sleep apnea (adult) (pediatric): Secondary | ICD-10-CM | POA: Diagnosis not present

## 2020-07-14 DIAGNOSIS — G894 Chronic pain syndrome: Secondary | ICD-10-CM | POA: Diagnosis not present

## 2020-07-14 DIAGNOSIS — J4540 Moderate persistent asthma, uncomplicated: Secondary | ICD-10-CM | POA: Diagnosis not present

## 2020-07-14 DIAGNOSIS — J471 Bronchiectasis with (acute) exacerbation: Secondary | ICD-10-CM | POA: Diagnosis not present

## 2020-07-14 DIAGNOSIS — J45909 Unspecified asthma, uncomplicated: Secondary | ICD-10-CM | POA: Diagnosis not present

## 2020-07-14 DIAGNOSIS — M0600 Rheumatoid arthritis without rheumatoid factor, unspecified site: Secondary | ICD-10-CM | POA: Diagnosis not present

## 2020-07-20 ENCOUNTER — Other Ambulatory Visit: Payer: Self-pay

## 2020-07-20 ENCOUNTER — Encounter: Payer: Self-pay | Admitting: Podiatry

## 2020-07-20 ENCOUNTER — Ambulatory Visit (INDEPENDENT_AMBULATORY_CARE_PROVIDER_SITE_OTHER): Payer: Medicare PPO

## 2020-07-20 ENCOUNTER — Ambulatory Visit: Payer: Medicare PPO | Admitting: Podiatry

## 2020-07-20 DIAGNOSIS — L97512 Non-pressure chronic ulcer of other part of right foot with fat layer exposed: Secondary | ICD-10-CM | POA: Diagnosis not present

## 2020-07-20 DIAGNOSIS — Z01818 Encounter for other preprocedural examination: Secondary | ICD-10-CM

## 2020-07-20 DIAGNOSIS — M86171 Other acute osteomyelitis, right ankle and foot: Secondary | ICD-10-CM | POA: Diagnosis not present

## 2020-07-20 DIAGNOSIS — M7021 Olecranon bursitis, right elbow: Secondary | ICD-10-CM | POA: Diagnosis not present

## 2020-07-20 DIAGNOSIS — M67441 Ganglion, right hand: Secondary | ICD-10-CM | POA: Diagnosis not present

## 2020-07-20 NOTE — Progress Notes (Signed)
Subjective:  Patient ID: Ebony Scott, female    DOB: August 28, 1950,  MRN: [id number]  Chief Complaint  Patient presents with  . Foot Pain    Right foot pain     70 y.o. female presents with the above complaint.  Patient presents with complaint of right second digit pain.  Patient states second toe started to become dislocated over the last few days.  She does not recall any trauma or injury to the area.  She is always had a little bit of ulceration that could have led to this becoming worse.  She denies any other acute complaints.  She would like to get this treated right away.  She does have an exposure of the bone.  She does not have any clinical signs of infection local or systemic.  She has not seen anyone else prior to seeing me for this.   Review of Systems: Negative except as noted in the HPI. Denies N/V/F/Ch.  Past Medical History:  Diagnosis Date  . Acute deep vein thrombosis (DVT) of right lower extremity ([place]) 12/13/2017  . Allergic rhinitis   . Allergy    SEASONAL  . Anemia   . Angio-edema   . Anxiety    pt denies  . Arthritis    Phreesia 10/21/2019  . Asthma   . Asthma    Phreesia 10/21/2019  . Breast cancer ([place]) 1998   in remission  . Cataract    REMOVED  . Clostridium difficile colitis 01/14/2018  . Clostridium difficile diarrhea 01/14/2018  . Complication of anesthesia    "had hard time waking up from it several times" (02/20/2012)  . Depression    "some; don't take anything for it" (02/20/2012)  . Diverticulosis   . DVT (deep venous thrombosis) ([place])   . Exertional dyspnea   . Fibromyalgia 11/2011  . GERD (gastroesophageal reflux disease)   . Graves disease   . Headache(784.0)    "related to allergies; more at different times during the year" (02/20/2012)  . Hemorrhoids   . Hiatal hernia    back and neck  . Hx of adenomatous colonic polyps 04/12/2016  . Hypercholesteremia    good cholesterol is high  . Hypothyroidism   . IBS (irritable bowel syndrome)    . Moderate persistent asthma    -FeV1 72% 2011, -IgE 102 2011, CT sinus Neg 2011  . Osteoporosis    on reclast yearly  . Peripheral vascular disease ([place]) 2019   DVTs  . Pneumonia 04/2011; ~ 11/2011   "double; single" (02/20/2012)  . Recurrent upper respiratory infection (URI)   . Seronegative rheumatoid arthritis ([place])    Dr. Lahoma Rocker  . SIRS (systemic inflammatory response syndrome) ([place]) 02/10/2018  . Tracheobronchomalacia     Current Outpatient Medications:  .  acetaminophen (TYLENOL) 650 MG CR tablet, Take 1,300 mg by mouth every 8 (eight) hours as needed for pain., Disp: , Rfl:  .  albuterol (PROVENTIL) (2.5 MG/3ML) 0.083% nebulizer solution, Take 3 mLs (2.5 mg total) by nebulization in the morning and at bedtime. Along with flutter valve, Disp: 180 mL, Rfl: 5 .  albuterol (VENTOLIN HFA) 108 (90 Base) MCG/ACT inhaler, INHALE 2 PUFFS BY MOUTH INTO THE LUNGS EVERY 4 HOURS AS NEEDED FOR WHEEZING OR SHORTNESS OF BREATH, Disp: 8.5 g, Rfl: 3 .  allopurinol (ZYLOPRIM) 100 MG tablet, Take 100 mg by mouth daily., Disp: , Rfl:  .  Alpha-D-Galactosidase (BEANO PO), Take 2-3 capsules by mouth 3 (three) times daily with meals., Disp: ,  Rfl:  .  Alpha-Lipoic Acid 600 MG CAPS, Take 600 mg by mouth daily. , Disp: , Rfl:  .  ammonium lactate (AMLACTIN) 12 % cream, Apply topically as needed for dry skin. (Patient taking differently: Apply 1 g topically at bedtime.), Disp: 385 g, Rfl: 2 .  atorvastatin (LIPITOR) 10 MG tablet, TAKE 1 TABLET BY MOUTH DAILY, Disp: 90 tablet, Rfl: 3 .  azelastine (ASTELIN) 0.1 % nasal spray, USE 2 SPRAYS IN EACH NOSTRIL DAILY AS DIRECTED (Patient taking differently: Place 2 sprays into both nostrils daily as needed for allergies.), Disp: 30 mL, Rfl: 5 .  benzonatate (TESSALON) 200 MG capsule, TAKE 1 CAPSULE BY MOUTH TWICE DAILY AS NEEDED FOR COUGH, Disp: 20 capsule, Rfl: 1 .  Calcium Citrate (CITRACAL PO), Take 1 tablet by mouth in the morning and at bedtime., Disp: ,  Rfl:  .  CALCIUM-MAGNESIUM PO, Take 1 tablet by mouth 2 (two) times daily., Disp: , Rfl:  .  cetirizine (ZYRTEC) 10 MG tablet, Take 10 mg by mouth daily., Disp: , Rfl:  .  cholecalciferol (VITAMIN D) 25 MCG (1000 UT) tablet, Take 1,000 Units by mouth daily. , Disp: , Rfl:  .  COVID-19 mRNA vaccine, Pfizer, 30 MCG/0.3ML injection, INJECT AS DIRECTED, Disp: .3 mL, Rfl: 0 .  cyproheptadine (PERIACTIN) 4 MG tablet, TAKE 2 TABLETS BY MOUTH EVERY EVENING (Patient taking differently: Take 8 mg by mouth every evening.), Disp: 180 tablet, Rfl: 2 .  denosumab (PROLIA) 60 MG/ML SOLN injection, Inject 60 mg into the skin every 6 (six) months. Administer in upper arm, thigh, or abdomen, Disp: , Rfl:  .  DEXILANT 60 MG capsule, TAKE 1 CAPSULE BY MOUTH EACH MORNING (Patient taking differently: Take 60 mg by mouth daily.), Disp: 90 capsule, Rfl: 3 .  diclofenac Sodium (VOLTAREN) 1 % GEL, Apply 2 g topically 4 (four) times daily as needed (joint pain)., Disp: 100 g, Rfl: 6 .  EPINEPHrine (EPIPEN 2-PAK) 0.3 mg/0.3 mL IJ SOAJ injection, Inject 0.3 mLs (0.3 mg total) into the muscle Once PRN. (Patient not taking: Reported on 07/07/2020), Disp: 0.3 mL, Rfl: 2 .  famotidine (PEPCID) 20 MG tablet, TAKE 1 TABLET BY MOUTH TWICE A DAY (Patient taking differently: Take 20 mg by mouth 2 (two) times daily.), Disp: 60 tablet, Rfl: 5 .  fluticasone (FLONASE) 50 MCG/ACT nasal spray, USE 2 SPRAYS IN EACH NOSTRIL DAILY, Disp: 16 g, Rfl: 2 .  folic acid (FOLVITE) 1 MG tablet, Take 1 mg by mouth daily., Disp: , Rfl:  .  furosemide (LASIX) 20 MG tablet, Take 1 tablet (20 mg total) by mouth every evening. Take 40 mg in the AM, and take additional 20 mg in the PM for swelling (Patient taking differently: Take 20 mg by mouth 2 (two) times daily. Take 40 mg in the AM, and take additional 20 mg in the PM for swelling), Disp: 30 tablet, Rfl: 3 .  furosemide (LASIX) 40 MG tablet, Take 1 tablet (40 mg total) by mouth daily as needed., Disp: 30  tablet, Rfl: 3 .  gabapentin (NEURONTIN) 600 MG tablet, TAKE 1 TABLET BY MOUTH DAILY (Patient taking differently: Take 600 mg by mouth at bedtime.), Disp: 90 tablet, Rfl: 3 .  guaiFENesin (MUCINEX) 600 MG 12 hr tablet, Take 1 tablet (600 mg total) by mouth 2 (two) times daily as needed for cough or to loosen phlegm., Disp: 60 tablet, Rfl: 1 .  hydrocortisone 2.5 % cream, Apply topically 2 (two) times daily as needed (hemorrhoids).,  Disp: 28 g, Rfl: 3 .  Lactase 9000 units TABS, Take 18,000-27,000 Units by mouth 4 (four) times daily as needed (dairy consumption)., Disp: , Rfl:  .  lidocaine (LIDODERM) 5 %, Place 1 patch onto the skin daily as needed (pain). Remove & Discard patch within 12 hours or as directed by MD, Disp: , Rfl:  .  lidocaine (LIDODERM) 5 %, APPLY 1 PATCH TOPICALLY AS DIRECTED, Disp: 30 patch, Rfl: 0 .  magnesium oxide (MAG-OX) 400 MG tablet, Take 400 mg by mouth daily., Disp: , Rfl:  .  mirtazapine (REMERON SOL-TAB) 30 MG disintegrating tablet, DISSOLVE 1 TABLET BY MOUTH EVERY NIGHT AT BEDTIME, Disp: 90 tablet, Rfl: 1 .  MITIGARE 0.6 MG CAPS, Take 0.6 mg by mouth every other day., Disp: , Rfl:  .  montelukast (SINGULAIR) 10 MG tablet, TAKE 1 TABLET BY MOUTH DAILY (Patient taking differently: Take 10 mg by mouth every evening.), Disp: 30 tablet, Rfl: 5 .  mupirocin ointment (BACTROBAN) 2 %, APPLY TO BED SORES AS NEEDED, Disp: 22 g, Rfl: 0 .  Nystatin (GERHARDT'S BUTT CREAM) CREA, Apply 1 application topically daily as needed (hemorrhoids). NYSTATIN 100,000 UNIT/GM CR 100000 CRM- 60 g, ZINC OXIDE 20 % OINT 20 OINT- 60 g, HYDROCORTISONE 1 % CREAM- 60 g (Patient not taking: Reported on 07/07/2020), Disp: 1 each, Rfl: 11 .  Nystatin (GERHARDT'S BUTT CREAM) CREA, Apply to the affected areas daily as needed for hemorrhoids (Patient not taking: Reported on 07/07/2020), Disp: 1 each, Rfl: 11 .  nystatin (MYCOSTATIN) 100000 UNIT/ML suspension, SWISH AND SPIT 5 MLS TWICE DAILY (Patient taking  differently: Use as directed 5 mLs in the mouth or throat at bedtime.), Disp: 473 mL, Rfl: 3 .  nystatin cream (MYCOSTATIN), Apply 1 application topically 2 (two) times daily. (Patient not taking: Reported on 07/07/2020), Disp: , Rfl:  .  nystatin-triamcinolone ointment (MYCOLOG), Apply topically 2 (two) times daily. (Patient taking differently: Apply 1 application topically 2 (two) times daily as needed (yeast infections).), Disp: 90 g, Rfl: 1 .  ondansetron (ZOFRAN) 4 MG tablet, Take 1 tablet (4 mg total) by mouth every 8 (eight) hours as needed for nausea or vomiting. (Patient not taking: Reported on 07/07/2020), Disp: 10 tablet, Rfl: 0 .  predniSONE (DELTASONE) 5 MG tablet, Take 15 mg by mouth daily with breakfast., Disp: , Rfl:  .  Prenatal Vit-Fe Fumarate-FA (PRENATAL MULTIVITAMIN) TABS tablet, Take 1 tablet by mouth daily., Disp: , Rfl:  .  Probiotic Product (ALIGN PO), Take 1 capsule by mouth daily., Disp: , Rfl:  .  Propylene Glycol (SYSTANE COMPLETE) 0.6 % SOLN, Place 1 drop into both eyes 2 (two) times daily as needed (dry eyes)., Disp: , Rfl:  .  Respiratory Therapy Supplies (FLUTTER) DEVI, Use as directed, Disp: 1 each, Rfl: 0 .  Salicylic Acid-Urea (KERASAL EX), Apply 1 application topically at bedtime., Disp: , Rfl:  .  sodium chloride HYPERTONIC 3 % nebulizer solution, Take by nebulization 2 (two) times daily., Disp: 750 mL, Rfl: 12 .  Spacer/Aero-Holding Chambers (AEROCHAMBER PLUS) inhaler, Use as instructed, Disp: 1 each, Rfl: 0 .  Tiotropium Bromide-Olodaterol (STIOLTO RESPIMAT) 2.5-2.5 MCG/ACT AERS, Inhale 2 puffs into the lungs daily., Disp: 4 g, Rfl: 3 .  XARELTO 20 MG TABS tablet, TAKE 1 TABLET BY MOUTH DAILY WITH SUPPER (Patient taking differently: Take 20 mg by mouth every evening.), Disp: 90 tablet, Rfl: 3  Current Facility-Administered Medications:  .  denosumab (PROLIA) injection 60 mg, 60 mg, Subcutaneous, Q6 months,  [name], MD, 60 mg at 03/31/20 1224  Social  History   Tobacco Use  Smoking Status Passive Smoke Exposure - Never Smoker  Smokeless Tobacco Never Used  Tobacco Comment   Parents    Allergies  Allergen Reactions  . Dust Mite Extract Shortness Of Breath and Other (See Comments)    "sneezing" (02/20/2012)  . Molds & Smuts Shortness Of Breath  . Morphine And Related Hives and Itching  . Other Shortness Of Breath and Other (See Comments)    Grass and weeds "sneezing; filled sinuses" (02/20/2012)  . Penicillins Rash and Other (See Comments)    "welts" (02/20/2012) Has patient had a PCN reaction causing immediate rash, facial/tongue/throat swelling, SOB or lightheadedness with hypotension: Unknown Has patient had a PCN reaction causing severe rash involving mucus membranes or skin necrosis: No Has patient had a PCN reaction that required hospitalization: No Has patient had a PCN reaction occurring within the last 10 years: No If all of the above answers are "NO", then may proceed with Cephalosporin use. Tolerated Ancef 02/16/20.   Marland Kitchen Reclast [Zoledronic Acid] Other (See Comments)    Fever, Put in hospital, dr said it was a reaction from a reaction   . Rofecoxib Swelling    Vioxx REACTION: feet swelling  . Shellfish Allergy Anaphylaxis, Shortness Of Breath, Itching and Rash  . Tetracycline Hcl Nausea And Vomiting  . Xolair [Omalizumab] Other (See Comments)    Caused Blood clot  . Dilaudid [Hydromorphone Hcl] Itching  . Hydrocodone-Acetaminophen Nausea And Vomiting  . Levofloxacin Other (See Comments)    GI upset  . Oxycodone Hcl Nausea And Vomiting  . Paroxetine Nausea And Vomiting    Paxil   . Baclofen Other (See Comments)    Altered mental status requiring 3 day hospital stay   . Celecoxib Swelling    Feet swelling  . Diltiazem Swelling  . Lactose Intolerance (Gi) Other (See Comments)    Bloating and gas  . Tree Extract Other (See Comments)    "tested and told I was allergic to it; never experienced a reaction to it"  (02/20/2012)   Objective:  There were no vitals filed for this visit. There is no height or weight on file to calculate BMI. Constitutional Well developed. Well nourished.  Vascular Dorsalis pedis pulses palpable bilaterally. Posterior tibial pulses palpable bilaterally. Capillary refill normal to all digits.  No cyanosis or clubbing noted. Pedal hair growth normal.  Neurologic Normal speech. Oriented to person, place, and time. Epicritic sensation to light touch grossly present bilaterally.  Dermatologic Nails well groomed and normal in appearance. No open wounds. No skin lesions.  Orthopedic:  Right second digit open ulceration with exposure of the head of the proximal phalanx.  No cellulitis noted.  No purulent drainage noted.  The wound appears to be clean.  Lateral deviation of the digit noted on the PIPJ joint.   Radiographs: 3 views of skeletally mature the right foot: Lateral deviation of the middle and distal phalanx on the right proximal phalanx noted.  Dislocation of the PIPJ joint noted of the right second digit.  Exposure from the soft tissue defect noted of the right second digit.  No soft tissue emphysema noted.  No signs of osteomyelitis noted. Assessment:   1. Toe ulcer, right, with fat layer exposed ([place])   2. Acute osteomyelitis of toe, right ([place])   3. Preoperative examination    Plan:  Patient was evaluated and treated and all questions answered.  Right  second digit dislocation with exposure of the head of the proximal phalanx -I explained the patient the etiology of dislocation various treatment options were discussed.  Given that the wound site is clean without any clinical signs of infection or purulent drainage.  I believe patient will benefit from internal amputation of the head of the proximal phalanx and the base of the middle phalanx with primary closure.  I discussed this with the patient extensive detail they both state understanding and would like to  proceed with the surgery.  I believe patient will benefit from office surgery with local anesthesia.  I discussed the risk and complication including loss of the digit.  I made no guarantees as to the outcome of the procedure.  Patient understands the risk and would like to proceed despite the complication including loss of the toe or the foot. -She will be weightbearing as tolerated after surgery in weightbearing as tolerated in surgical shoe -Informed surgical risk consent was reviewed and read aloud to the patient.  I reviewed the films.  I have discussed my findings with the patient in great detail.  I have discussed all risks including but not limited to infection, stiffness, scarring, limp, disability, deformity, damage to blood vessels and nerves, numbness, poor healing, need for braces, arthritis, chronic pain, amputation, death.  All benefits and realistic expectations discussed in great detail.  I have made no promises as to the outcome.  I have provided realistic expectations.  I have offered the patient a 2nd opinion, which they have declined and assured me they preferred to proceed despite the risks -I discussed with her if the ulceration becomes worse to go see emergency room right away.   No follow-ups on file.

## 2020-07-21 ENCOUNTER — Telehealth: Payer: Self-pay | Admitting: Podiatry

## 2020-07-21 ENCOUNTER — Telehealth: Payer: Self-pay | Admitting: Urology

## 2020-07-21 ENCOUNTER — Other Ambulatory Visit: Payer: Self-pay | Admitting: Podiatry

## 2020-07-21 MED ORDER — DOXYCYCLINE HYCLATE 100 MG PO TABS: 100.0000 mg | ORAL_TABLET | Freq: Two times a day (BID) | ORAL | 0 refills | Status: DC

## 2020-07-21 NOTE — Telephone Encounter (Signed)
I sent it again  

## 2020-07-21 NOTE — Telephone Encounter (Signed)
Patient called our office stating that a prescription  for a antibiotic was suppose to be sent in to [facility] but she states she has not received it.

## 2020-07-21 NOTE — Telephone Encounter (Signed)
DOS - 07/22/20 OFFICE   PART. EXC PHALANX 2ND RIGHT --- 51833   [facility] EFFECTIVE DATE - 02/20/19   PER [place] NO PRIOR AUTH IS REQUIRED FOR CPT CODE 58251.

## 2020-07-21 NOTE — Telephone Encounter (Signed)
I called the Patient the back to let her know that Dr. Posey Pronto resent her prescription

## 2020-07-22 ENCOUNTER — Telehealth: Payer: Self-pay | Admitting: *Deleted

## 2020-07-22 ENCOUNTER — Encounter: Payer: Self-pay | Admitting: Podiatry

## 2020-07-22 ENCOUNTER — Other Ambulatory Visit: Payer: Self-pay

## 2020-07-22 ENCOUNTER — Ambulatory Visit: Payer: Medicare PPO | Admitting: Podiatry

## 2020-07-22 DIAGNOSIS — L97512 Non-pressure chronic ulcer of other part of right foot with fat layer exposed: Secondary | ICD-10-CM

## 2020-07-22 DIAGNOSIS — M86171 Other acute osteomyelitis, right ankle and foot: Secondary | ICD-10-CM

## 2020-07-22 NOTE — Progress Notes (Signed)
Surgeon: Dr. Boneta Lucks, D.P.M. Assistants: None Pre-operative diagnosis: Right second digit osteomyelitis Post-operative diagnosis: same Procedure: 1.  Right second digit excision of phalanx/internal amputation Pathology: None Pertinent Intra-op findings: Clean wound noted.  No purulent drainage noted no signs of infection noted Anesthesia: One-to-one mixture 1% lidocaine plain half percent Marcaine plain 20 cc Hemostasis: Calf tourniquet for 19 minutes  EBL: 10 cc Materials: 3-0 Prolene Injectables: None Complications: None  Indications for surgery: A 70 y.o. female presents with right second digit exposed head of the proximal phalanx with underlying osteomyelitis. Patient has failed all conservative therapy including but not limited to antibiotics and local wound care. She wishes to have surgical correction of the foot/deformity. It was determined that patient would benefit from right second digit excision of phalanx with internal amputation. Informed surgical risk consent was reviewed and read aloud to the patient.  I reviewed the films.  I have discussed my findings with the patient in great detail.  I have discussed all risks including but not limited to infection, stiffness, scarring, limp, disability, deformity, damage to blood vessels and nerves, numbness, poor healing, need for braces, arthritis, chronic pain, amputation, death.  All benefits and realistic expectations discussed in great detail.  I have made no promises as to the outcome.  I have provided realistic expectations.  I have offered the patient a 2nd opinion, which they have declined and assured me they preferred to proceed despite the risks   Procedure in detail: The patient was both verbally and visually identified by myself, the nursing staff, and anesthesia staff in the preoperative holding area. They were then transferred to the operating room and placed on the operative table in supine position.  Attention was  directed to the right second digit, a skin marker was used to delineate the incision which include exposed bone.  15 blade was used to carry the incision from the epidermal dermal layer down to the level of the bone.  At this time the head of the proximal phalanx was visualized and was noted to be soft and friable.  Using sagittal saw the head of the proximal phalanx was removed.  Given the wound was also probing down to the base of the middle phalanx at this time decision was made to partially remove the base of the middle phalanx.  No further infection noted.  No purulent drainage noted. Historically irrigated with normal saline solution.  The wound was primarily closed with 3-0 Prolene in simple interrupted suture technique.  The incision was dressed with Betadine wet-to-dry gauze, Kerlix, Ace bandage.  All bony prominences were adequately padded  At the conclusion of the procedure the patient was awoken from anesthesia and found to have tolerated the procedure well any complications. There were transferred to PACU with vital signs stable and vascular status intact.  Boneta Lucks, DPM

## 2020-07-22 NOTE — Telephone Encounter (Signed)
Patient is calling for the status on a refill medication that was supposed to be sent to pharmacy on file, not there,only the doxycycline. Please advise or resend.

## 2020-07-25 ENCOUNTER — Telehealth: Payer: Self-pay | Admitting: *Deleted

## 2020-07-25 ENCOUNTER — Other Ambulatory Visit: Payer: Self-pay | Admitting: Podiatry

## 2020-07-25 MED ORDER — TRAMADOL HCL 50 MG PO TABS: 50.0000 mg | ORAL_TABLET | Freq: Three times a day (TID) | ORAL | 0 refills | Status: AC | PRN

## 2020-07-25 NOTE — Telephone Encounter (Signed)
Returned call to patient and informed, verbalized understanding.

## 2020-07-25 NOTE — Telephone Encounter (Signed)
Patient is calling for the status if a  prescription of Tramadol,was supposed to be sent and is not there. Please advise

## 2020-07-25 NOTE — Telephone Encounter (Signed)
done

## 2020-07-27 DIAGNOSIS — M67441 Ganglion, right hand: Secondary | ICD-10-CM | POA: Diagnosis not present

## 2020-07-29 ENCOUNTER — Ambulatory Visit (INDEPENDENT_AMBULATORY_CARE_PROVIDER_SITE_OTHER): Payer: Medicare PPO | Admitting: Podiatry

## 2020-07-29 ENCOUNTER — Other Ambulatory Visit: Payer: Self-pay

## 2020-07-29 ENCOUNTER — Ambulatory Visit: Payer: Medicare PPO | Admitting: Podiatry

## 2020-07-29 ENCOUNTER — Ambulatory Visit (INDEPENDENT_AMBULATORY_CARE_PROVIDER_SITE_OTHER): Payer: Medicare PPO

## 2020-07-29 DIAGNOSIS — M86171 Other acute osteomyelitis, right ankle and foot: Secondary | ICD-10-CM

## 2020-07-29 DIAGNOSIS — Z9889 Other specified postprocedural states: Secondary | ICD-10-CM

## 2020-07-29 DIAGNOSIS — L97512 Non-pressure chronic ulcer of other part of right foot with fat layer exposed: Secondary | ICD-10-CM

## 2020-07-31 IMAGING — CR DG CHEST 2V
2 series · 2 of 2 positions shown · non-contrast
Comparison: Chest x-ray 12/17/2017

CLINICAL DATA: Weakness over the last 4 days, some shortness of
breath, history of breast carcinoma diagnosed in [id number]

EXAM:
CHEST - 2 VIEW

[w chest lat]
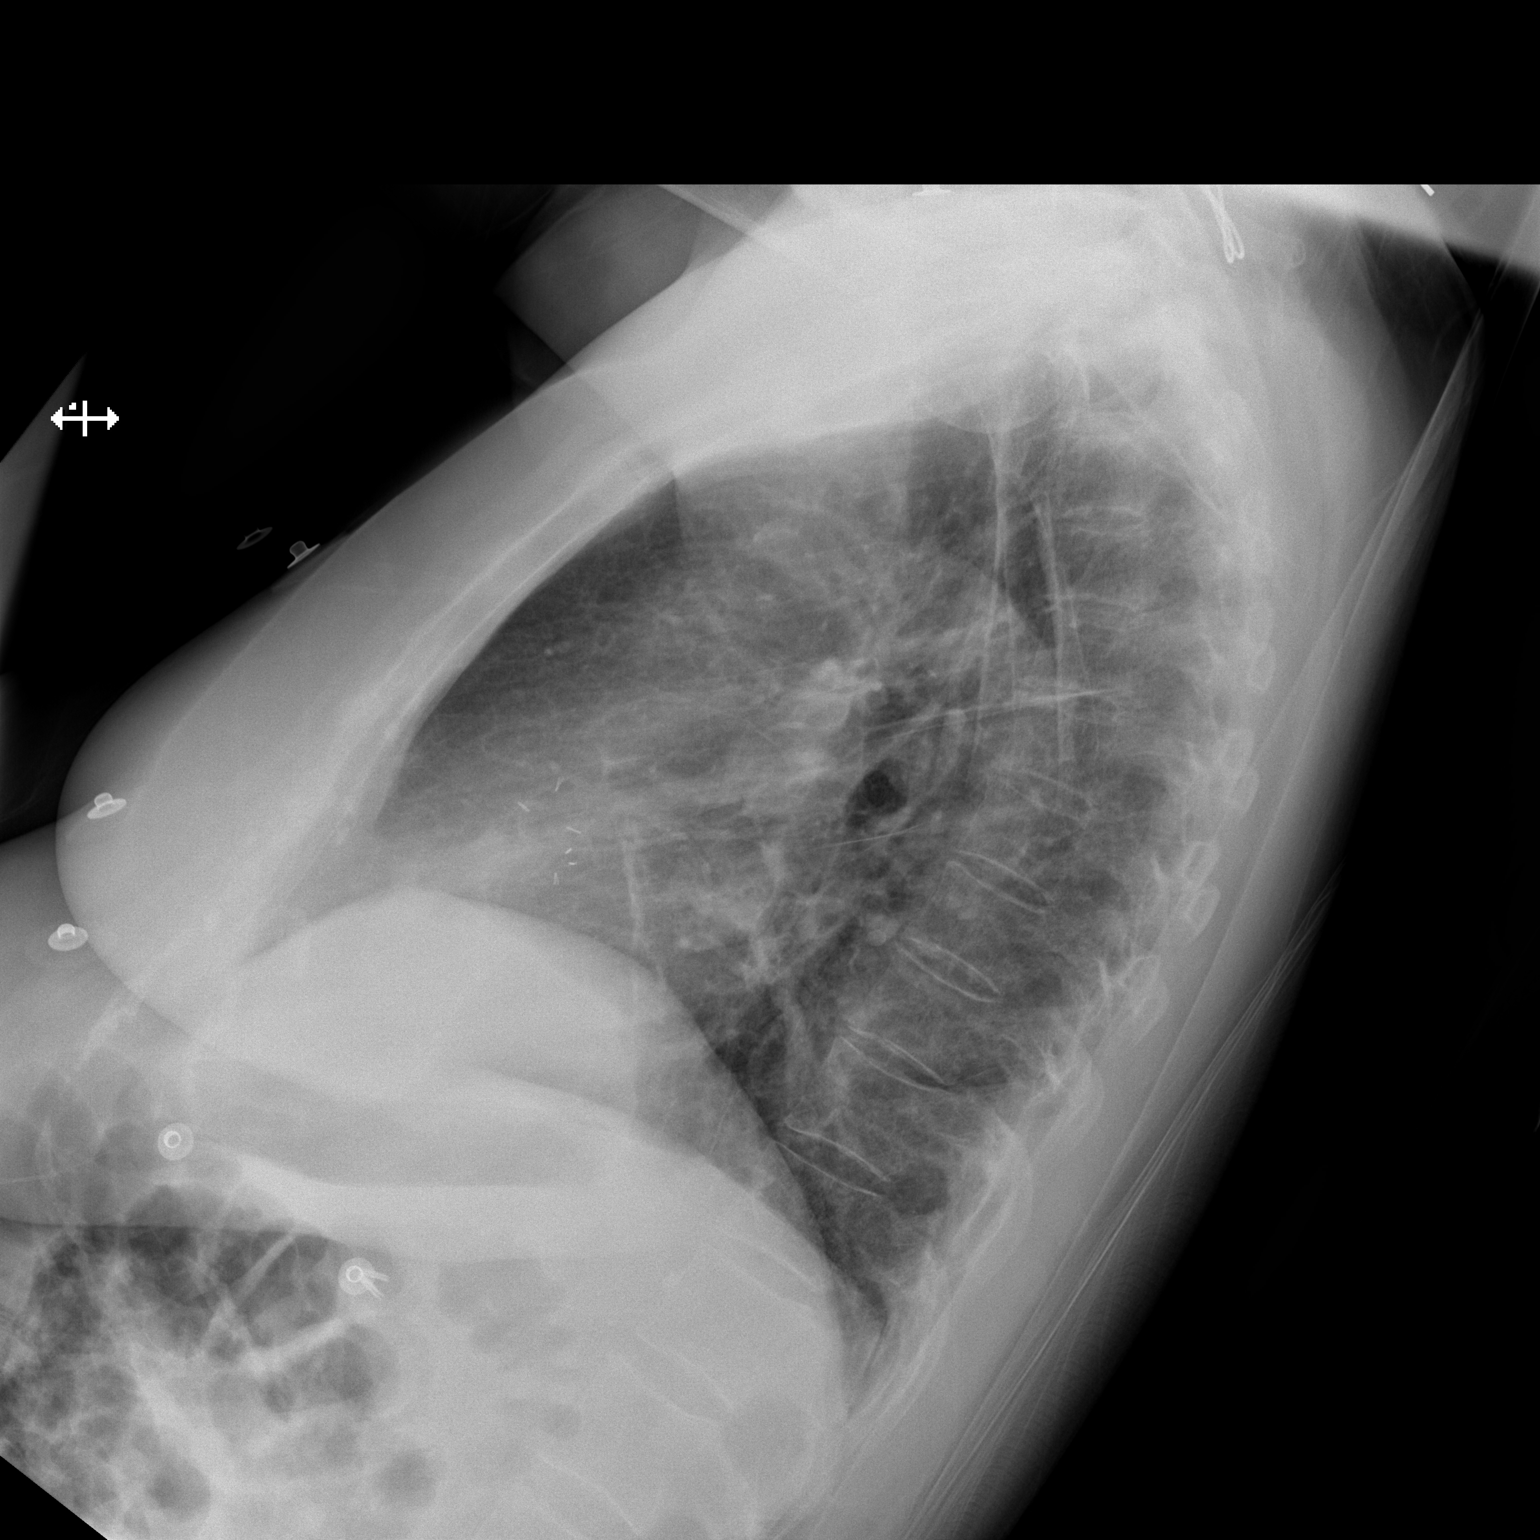

[x chest ap]
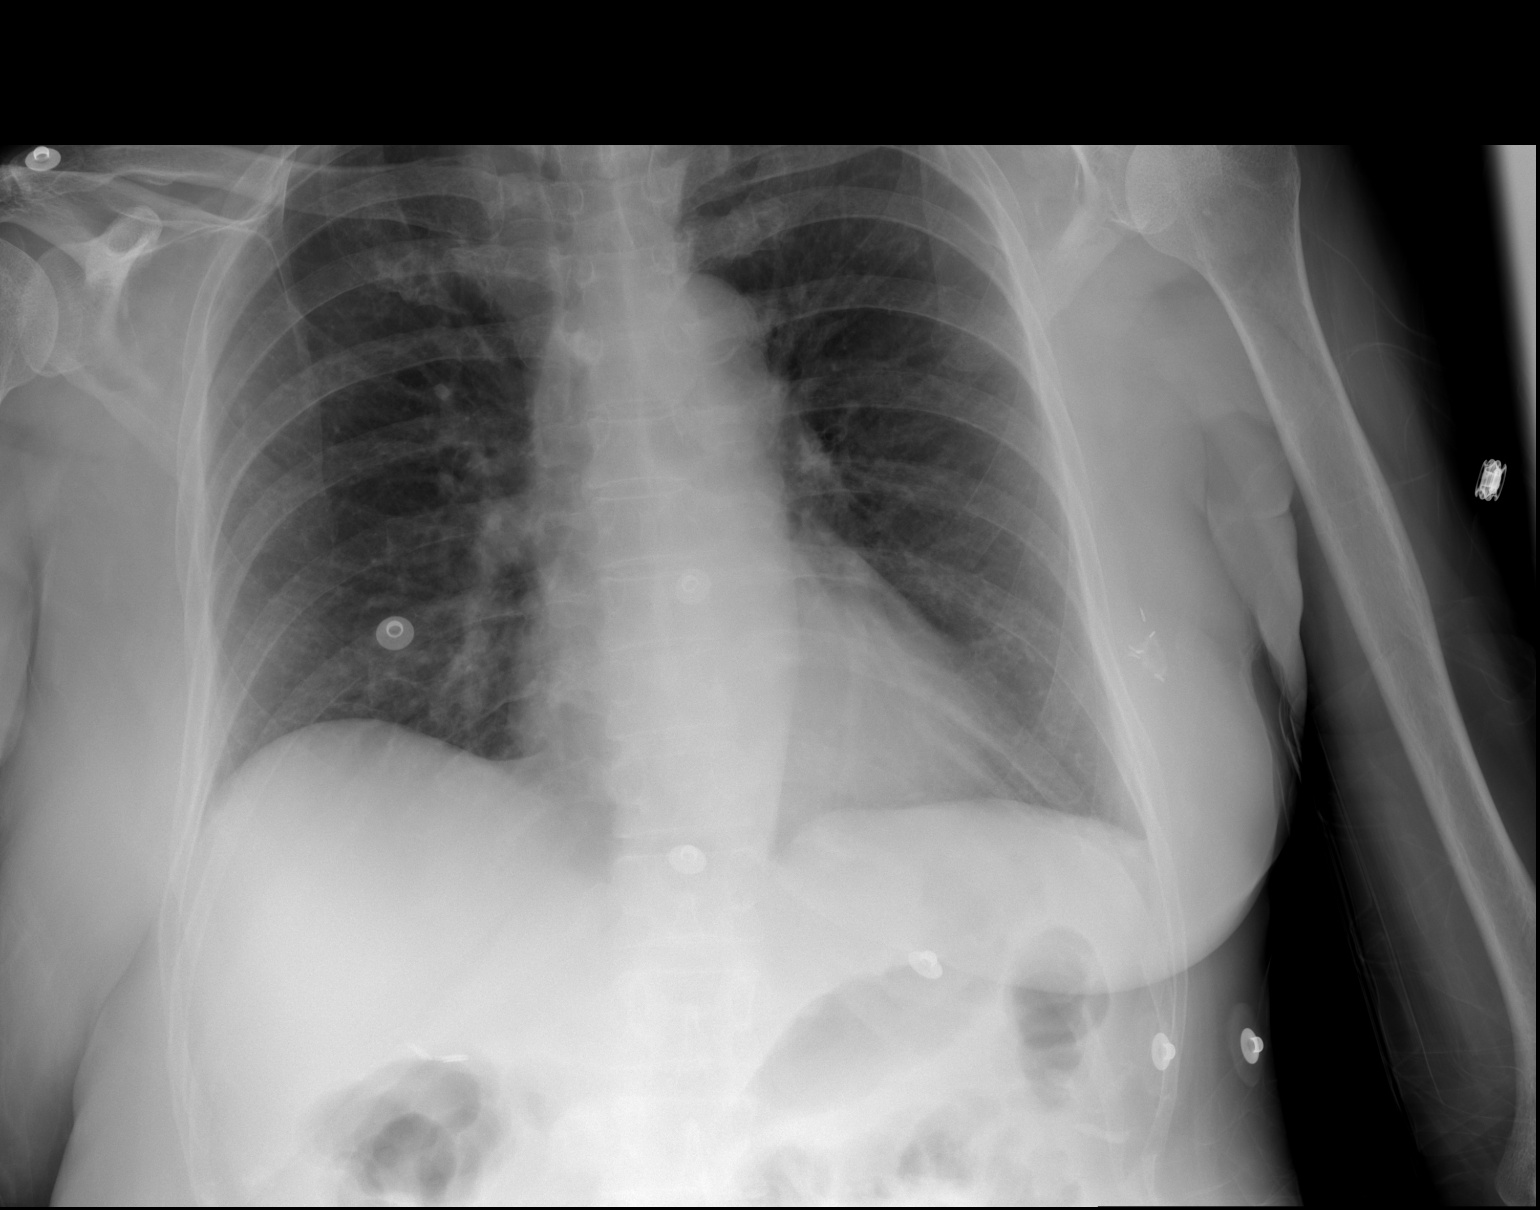

[2 of 2 positions shown; findings below may reference images not displayed]

FINDINGS: No active infiltrate is seen and there is no evidence of pleural
effusion. Mediastinal and hilar contours are unremarkable. Mild
cardiomegaly is stable. No acute bony abnormality is seen.
IMPRESSION: Stable mild cardiomegaly.  No active lung disease.

## 2020-07-31 IMAGING — DX DG FOOT 2V*R*
2 series · 2 of 2 positions shown · non-contrast
Comparison: Foot radiograph 02/20/2012

CLINICAL DATA: Right foot and ankle pain.

EXAM:
RIGHT FOOT - 2 VIEW

[foot ap]
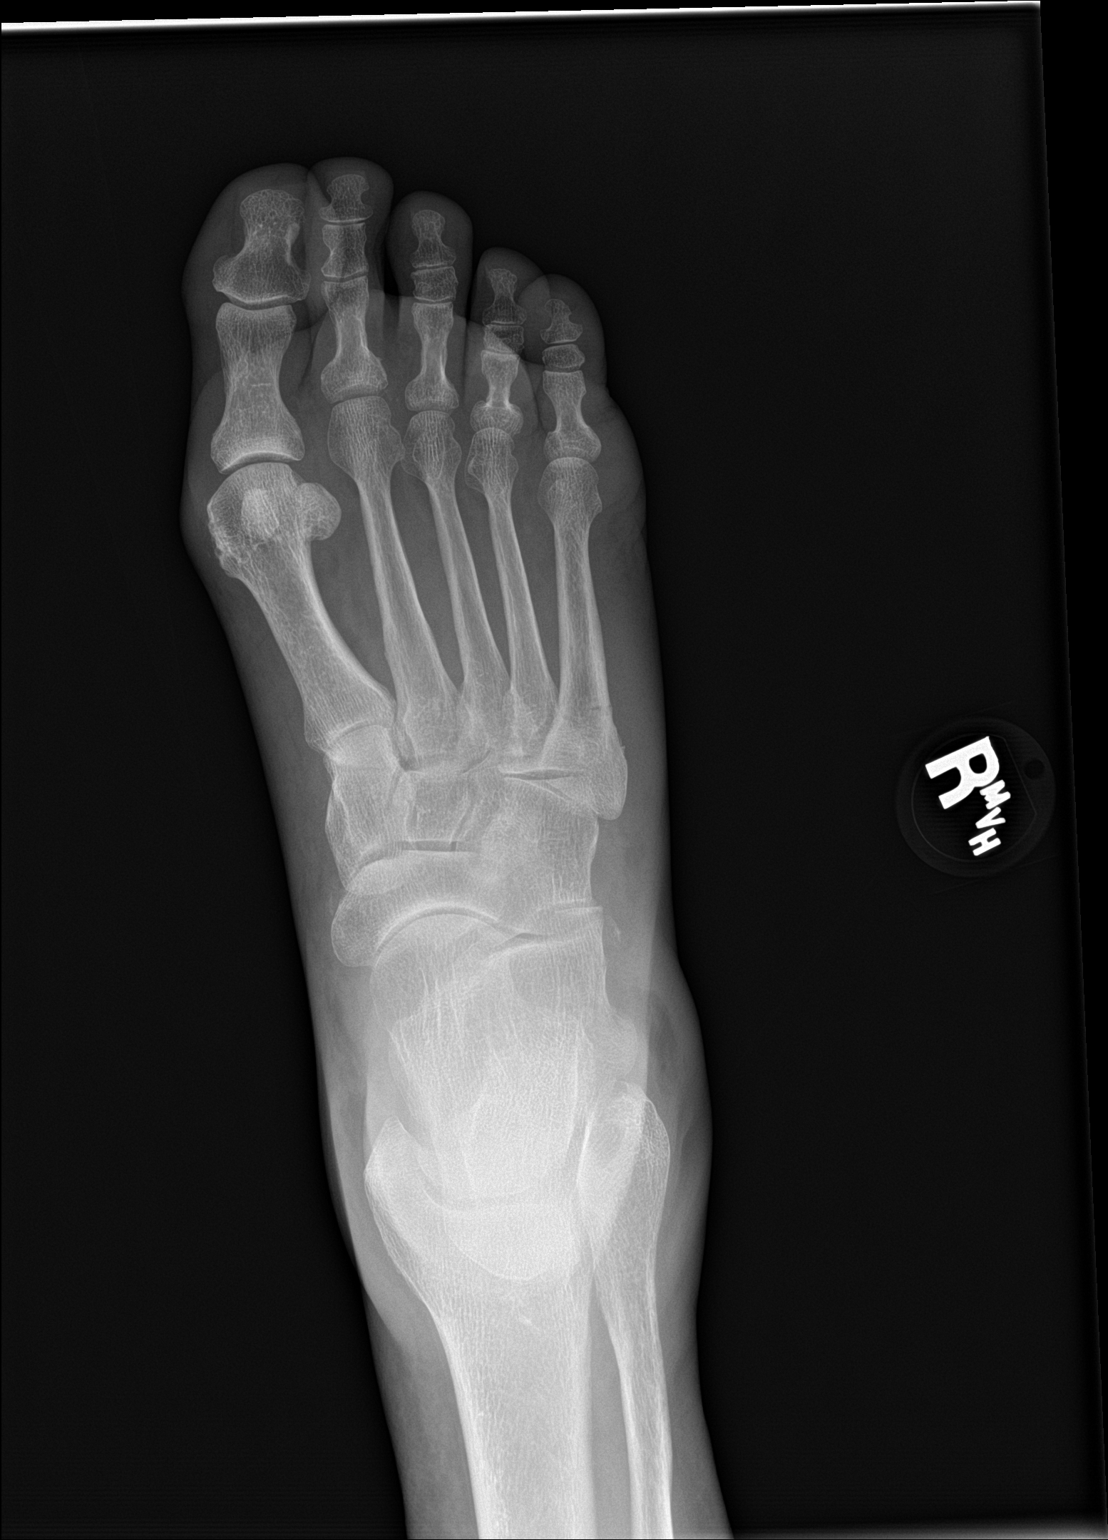

[foot lat]
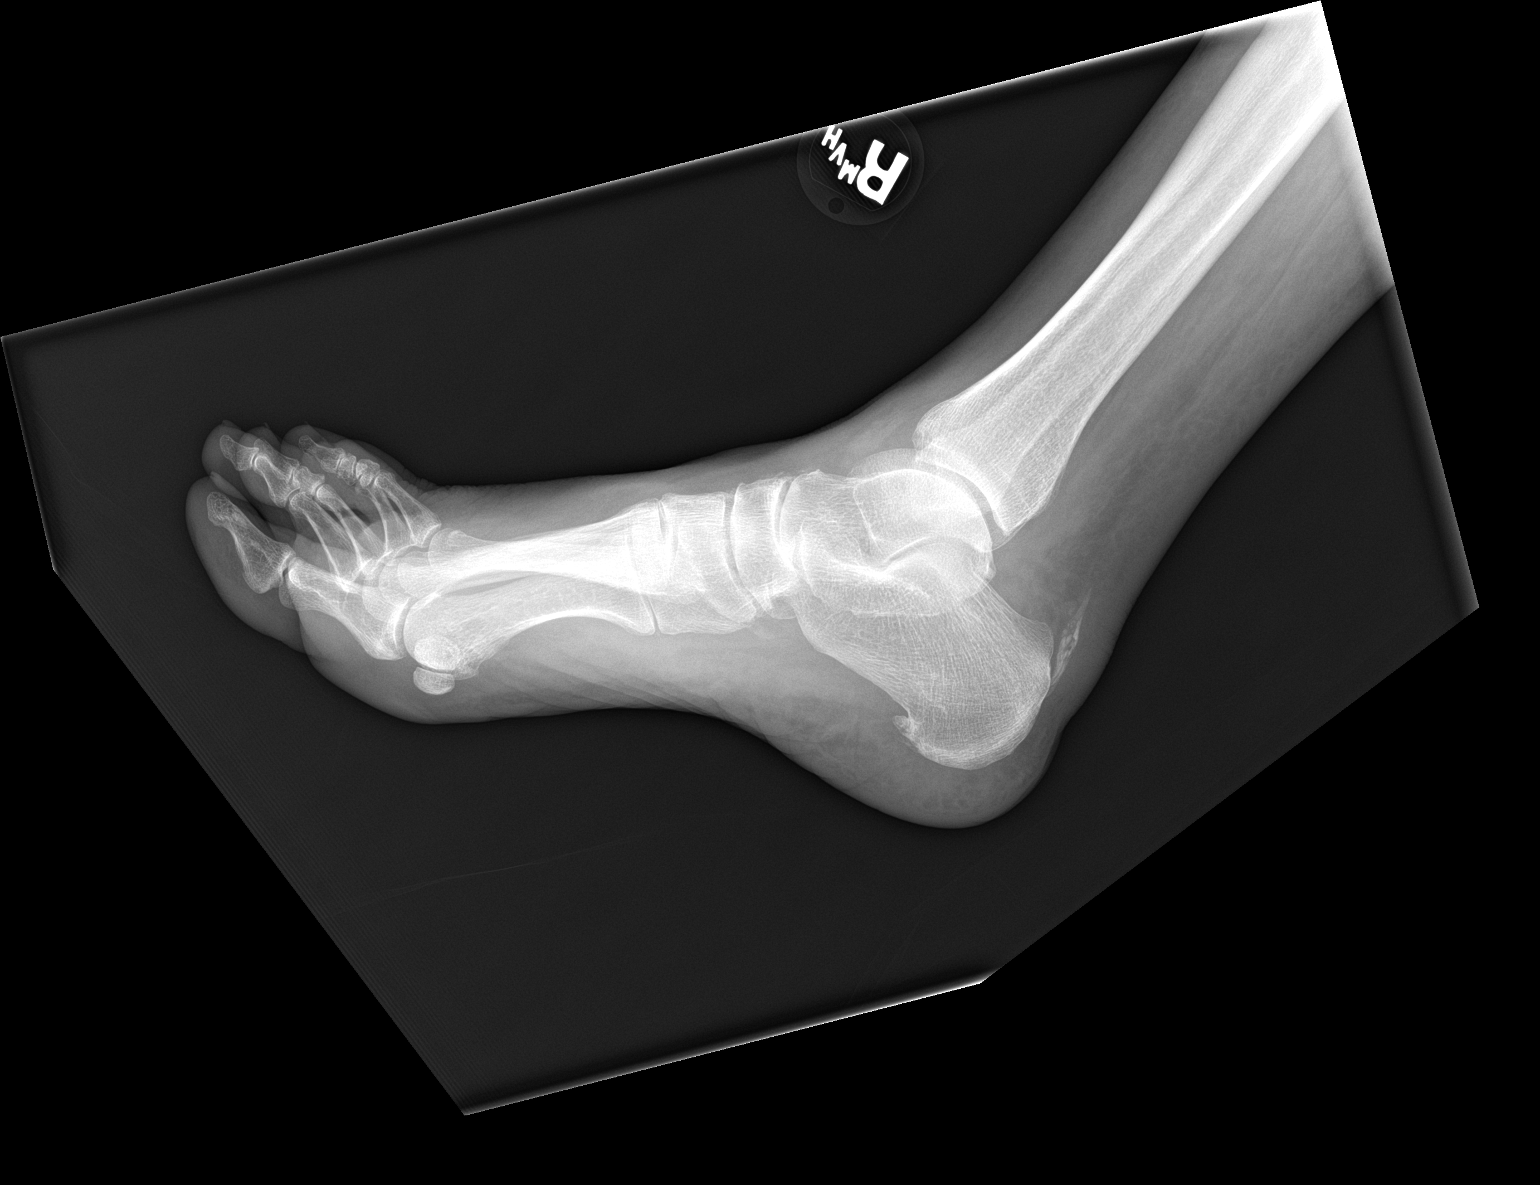

[2 of 2 positions shown; findings below may reference images not displayed]

FINDINGS: Suspected incomplete transverse fracture of the proximal fifth
metatarsal about the lateral cortex. No other fracture. Small
osseous density about delay lateral cuboid is likely an accessory
ossicle. Minimal hallux valgus, alignment is otherwise maintained.
No periosteal reaction or bony destructive change. Plantar calcaneal
spur and Achilles tendon enthesophyte. Diffuse soft tissue edema.
IMPRESSION: Suspected incomplete transverse fracture of the proximal fifth
metatarsal shaft.

## 2020-07-31 IMAGING — CR DG ANKLE COMPLETE 3+V*L*
3 series · 3 of 3 positions shown · non-contrast
Comparison: Left ankle films of 12/16/2016

CLINICAL DATA: Weakness over 4 days, left foot and ankle pain

EXAM:
LEFT ANKLE COMPLETE - 3+ VIEW

[x ankle ap left]
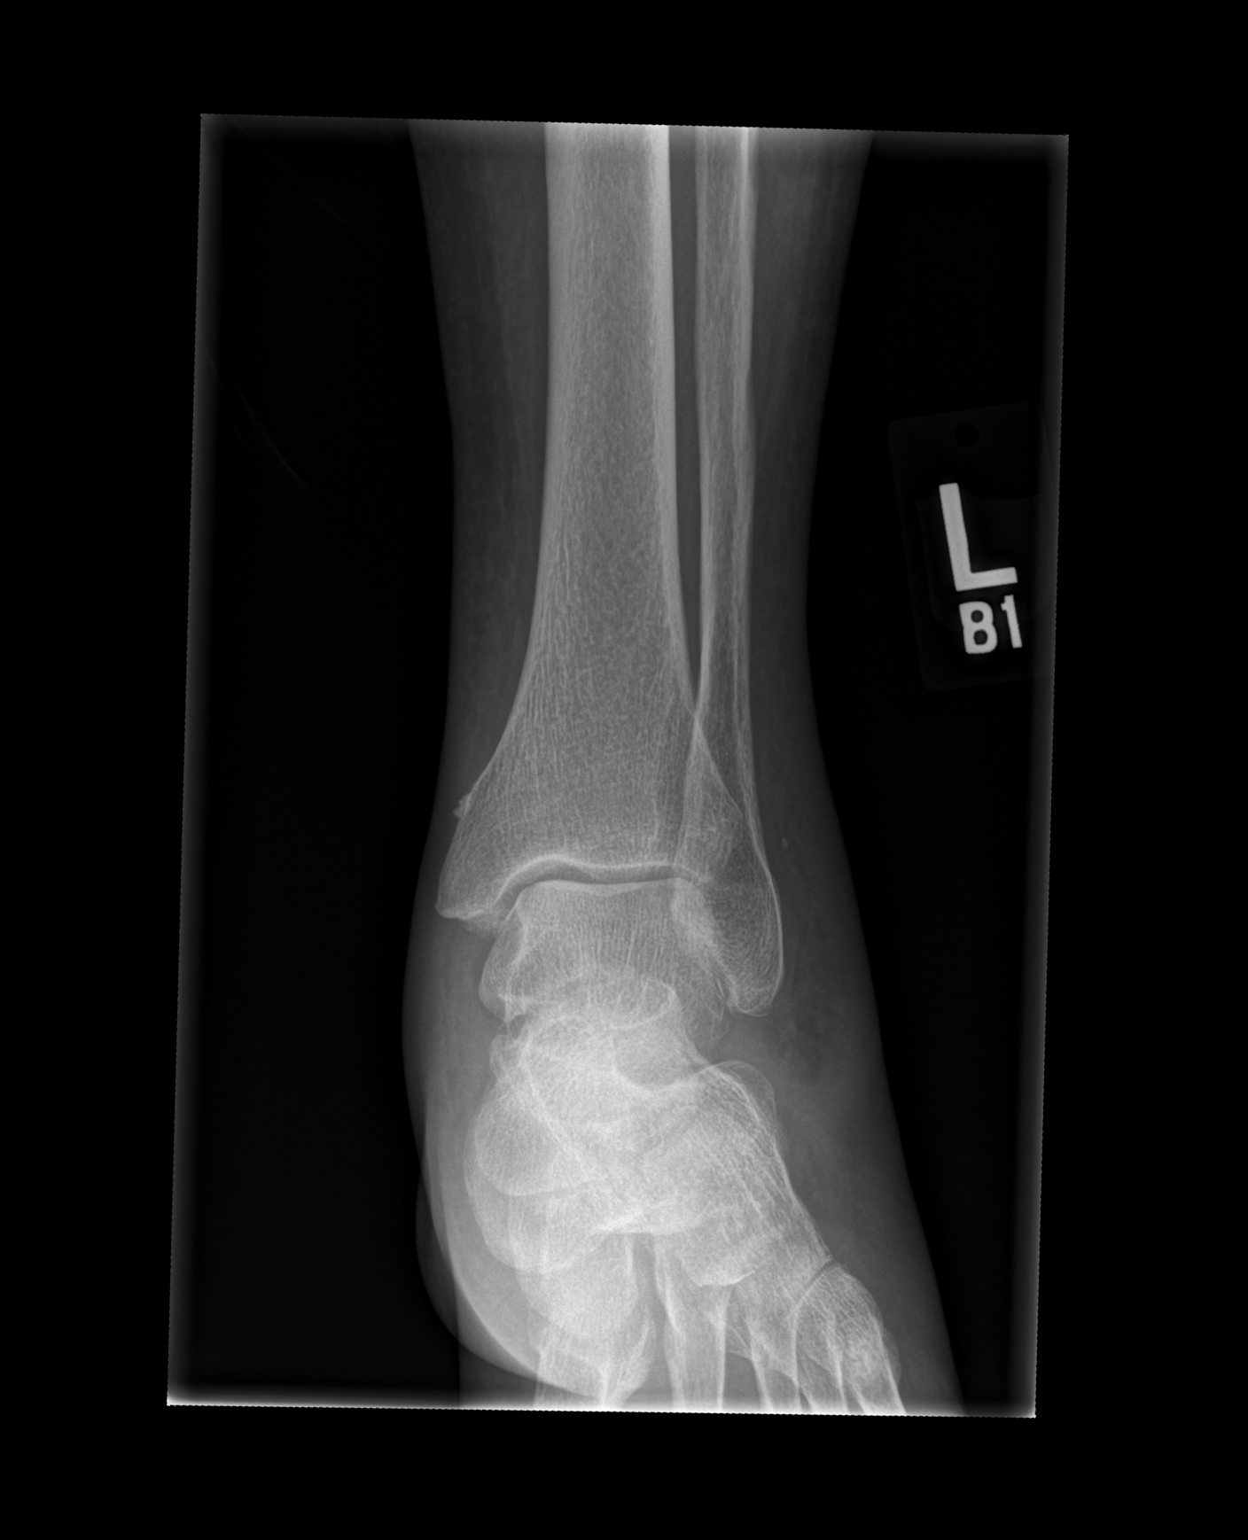

[x ankle obl left]
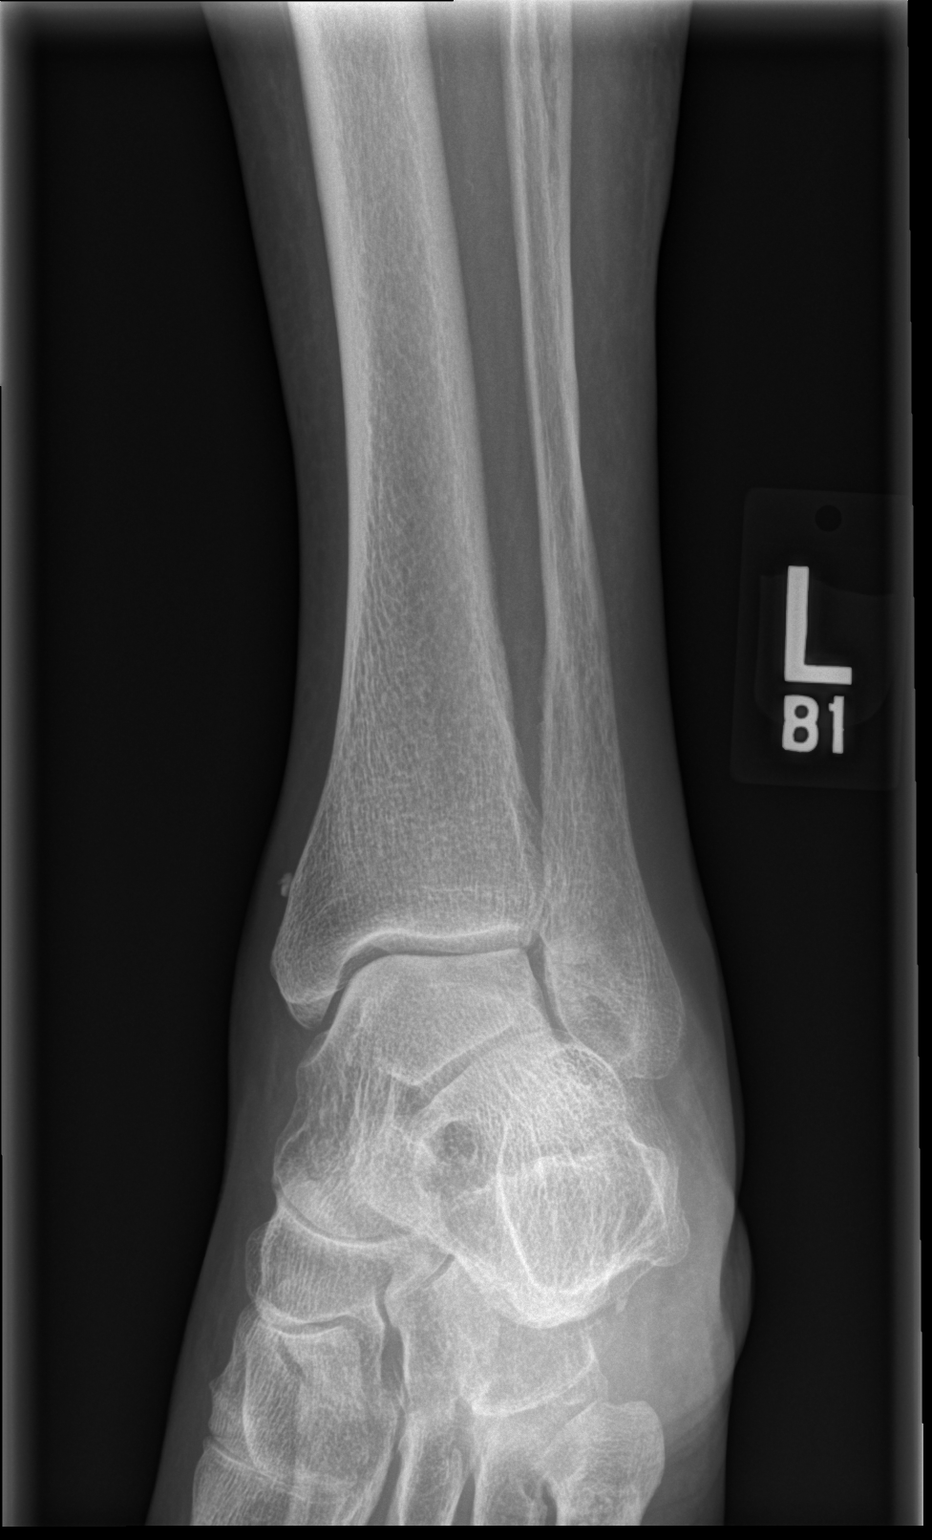

[x ankle lat left]
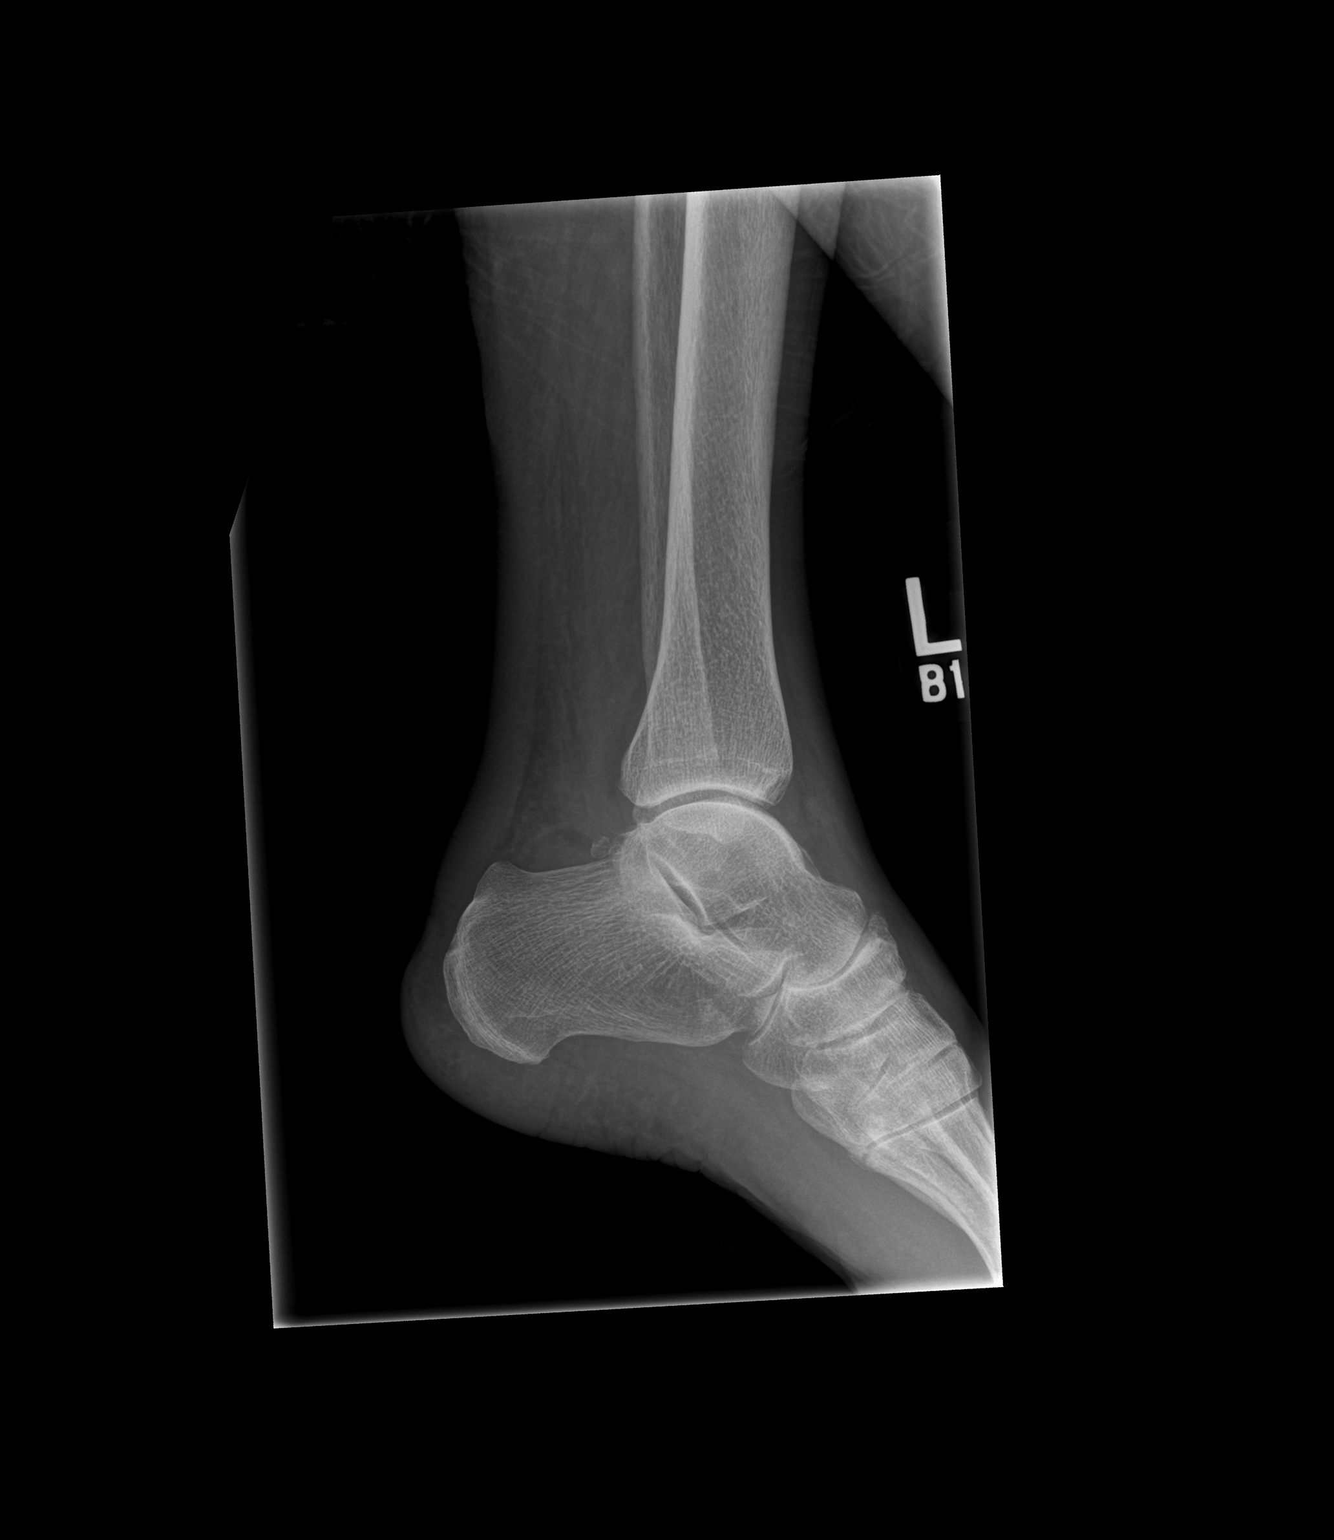

[3 of 3 positions shown; findings below may reference images not displayed]

FINDINGS: The left ankle joint appears normal. Alignment is normal. No acute
abnormality is seen. Again the nonunited fracture through the base
of the left fifth metatarsal is present.
IMPRESSION: Negative left ankle. Ninety night and fracture through the base of
the left fifth metatarsal.

## 2020-07-31 IMAGING — DX DG ANKLE 2V *R*
2 series · 2 of 2 positions shown · non-contrast
Comparison: Ankle radiograph 06/10/2012

CLINICAL DATA: Right foot and ankle pain.

EXAM:
RIGHT ANKLE - 2 VIEW

[ankle ap]
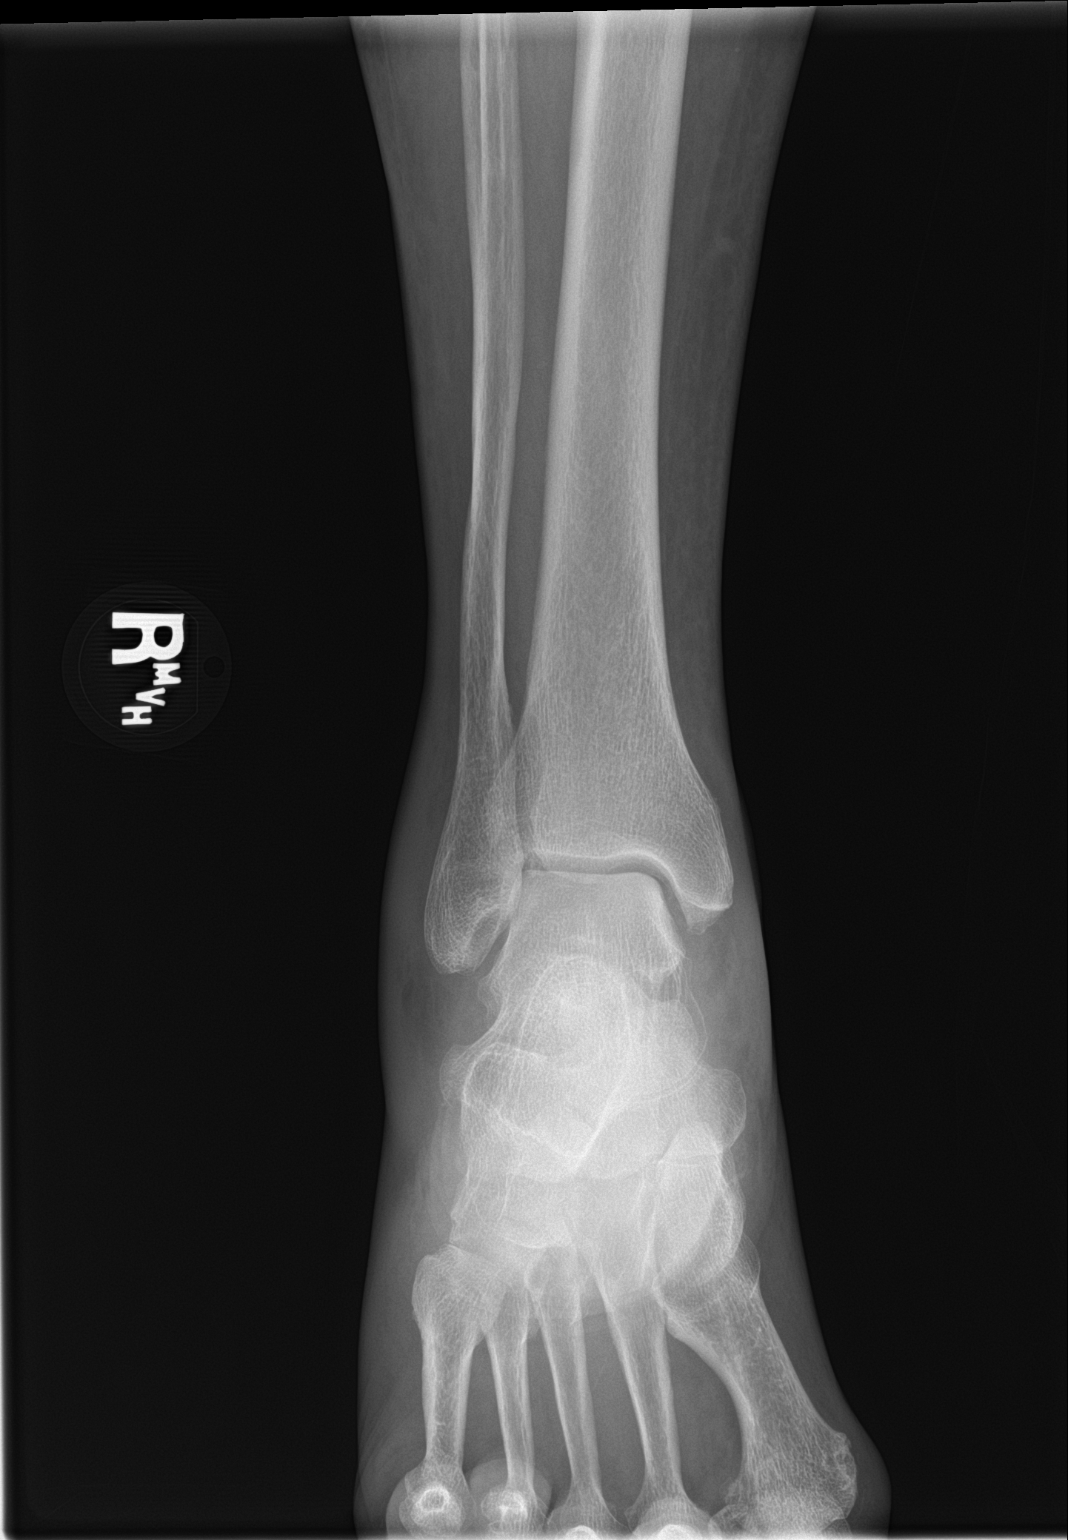

[ankle lat]
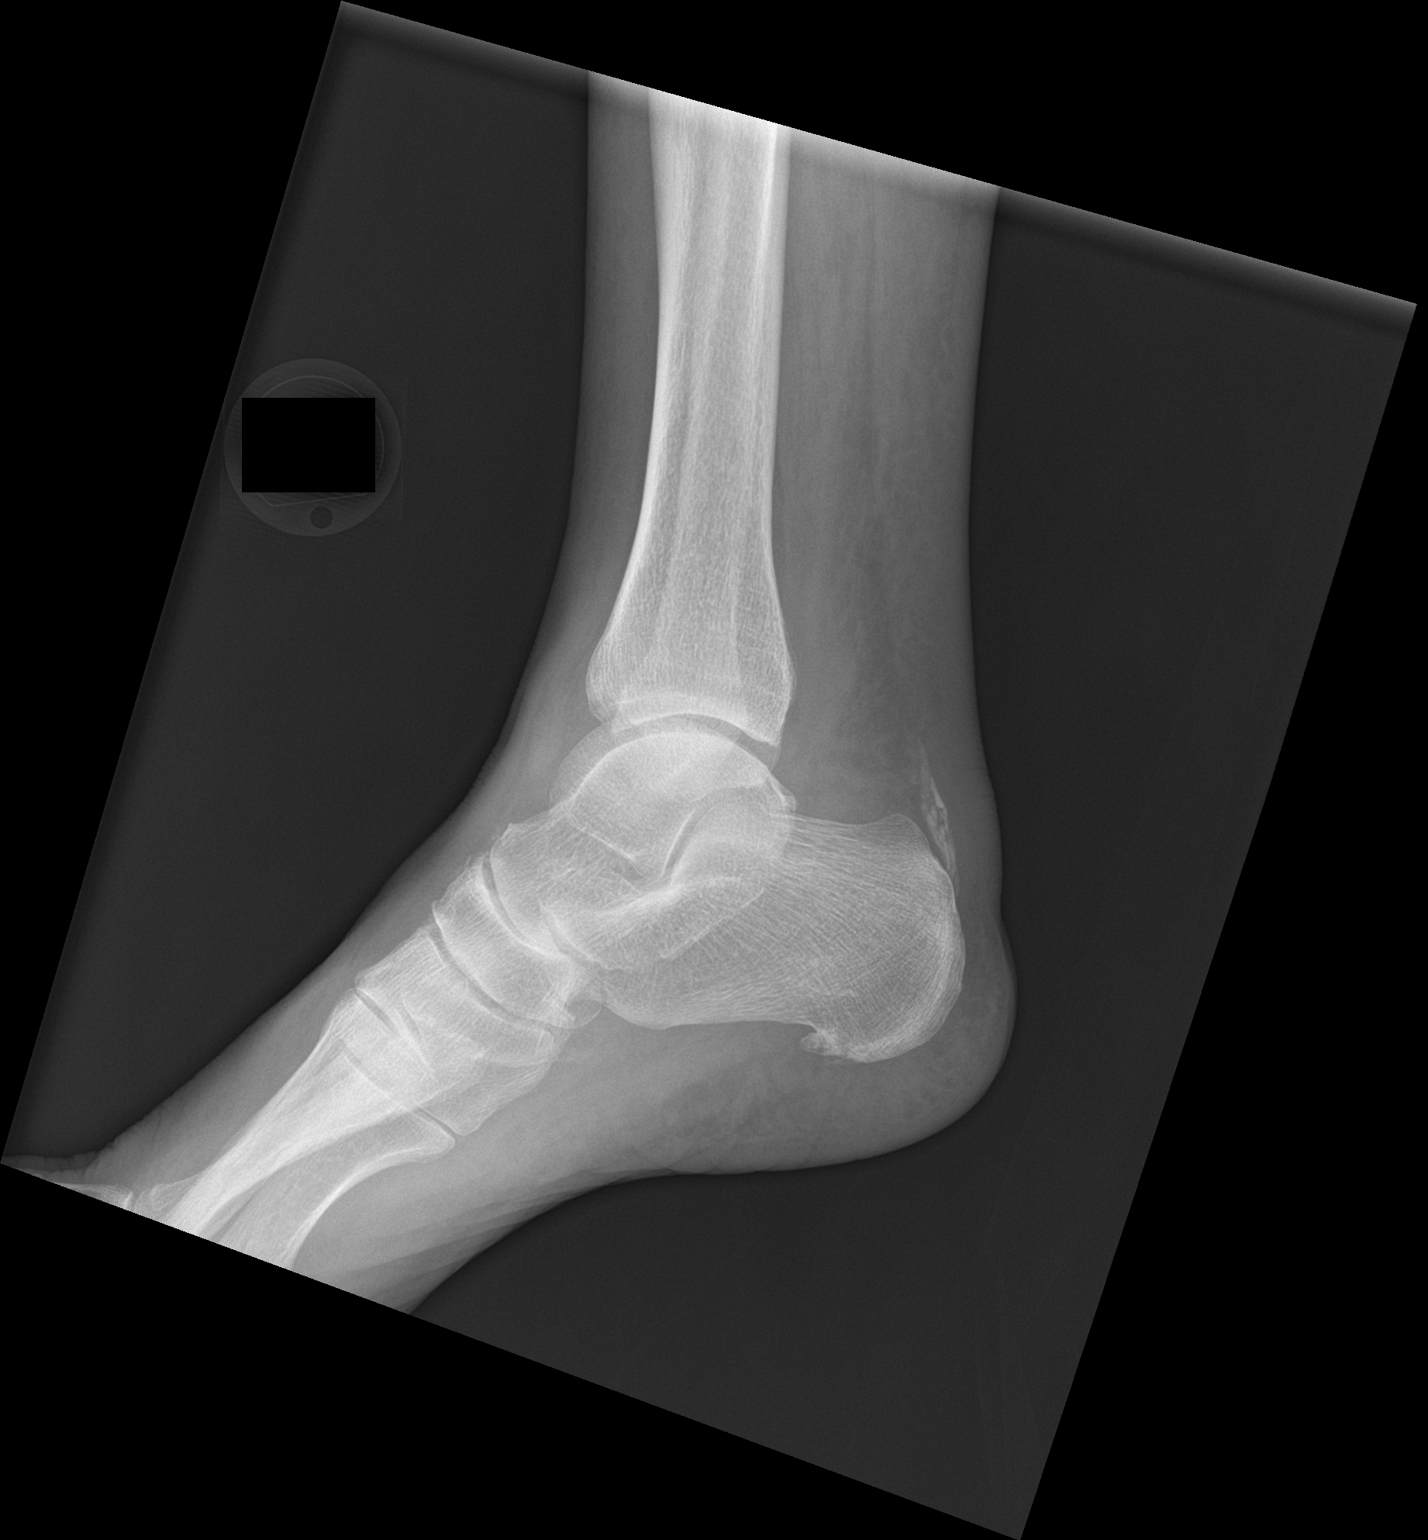

[2 of 2 positions shown; findings below may reference images not displayed]

FINDINGS: There is no evidence of fracture or dislocation. Ankle mortise is
preserved. Mild talonavicular degenerative change. Plantar calcaneal
spur and Achilles tendon enthesophyte. No bony destructive change or
periosteal reaction. Diffuse soft tissue edema about the ankle.
Increased density anteriorly may be generalized soft tissue edema or
small effusion.
IMPRESSION: 1. Diffuse soft tissue edema about the ankle. Focal soft tissue
density anteriorly may be joint effusion or more generalized
subcutaneous edema.
2. No acute osseous abnormality.

## 2020-08-01 IMAGING — CT CT ABD-PELV W/ CM
2 of 5 series · 16 of 46 positions shown, 18 images · IV contrast (omnipaque)
Comparison: 03/31/2007

CLINICAL DATA: nausea vomiting and profuse diarrhea. Stool studies
were done. C. difficile antigen was positive. Toxin negative. PCR
positive. GI pathogen panel pending. Patient with abdominal
tenderness on examination. Her WBC is normal. She was febrile last
night.

EXAM:
CT ABDOMEN AND PELVIS WITH CONTRAST
TECHNIQUE: Multidetector CT imaging of the abdomen and pelvis was performed
using the standard protocol following bolus administration of
intravenous contrast.
CONTRAST:  100mL OMNIPAQUE IOHEXOL 300 MG/ML SOLN, 30mL OMNIPAQUE
IOHEXOL 300 MG/ML SOLN

[Series 2: axial st · axial · 0.74mm/px · z∈[-389,+16]mm · 13 of 95 slices shown, 15 images]
[im 7/95  soft-tissue]
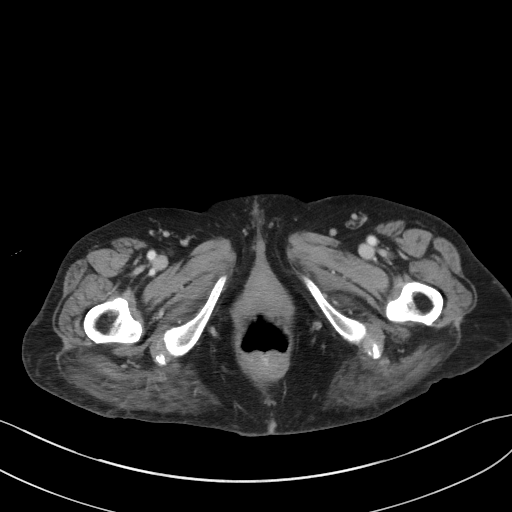
[im 7/95  bone]
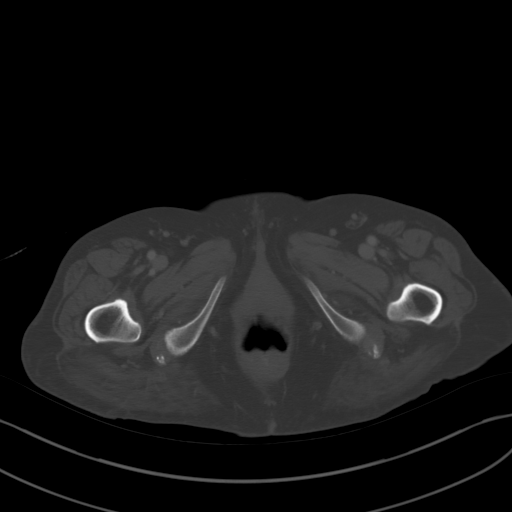
[im 13/95  soft-tissue]
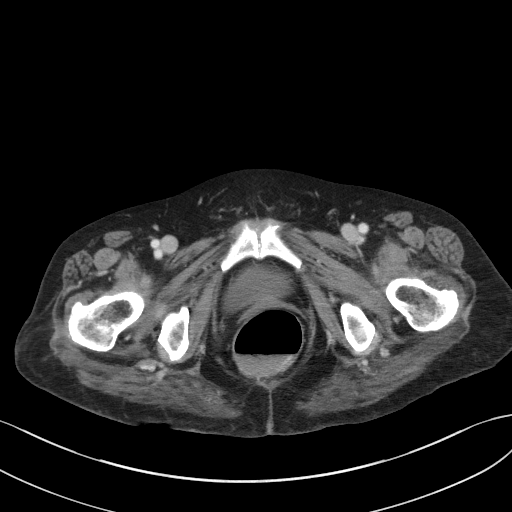
[im 19/95  soft-tissue]
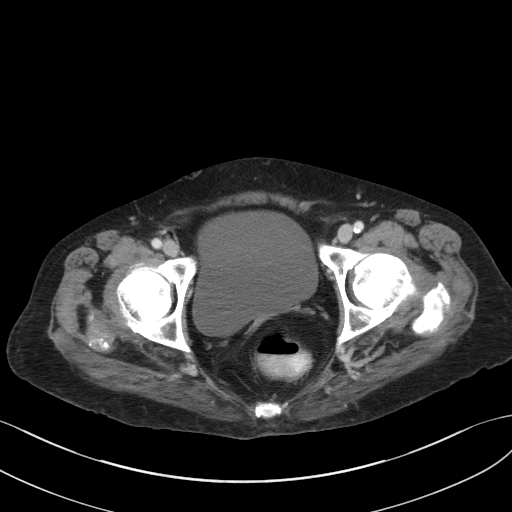
[im 26/95  soft-tissue]
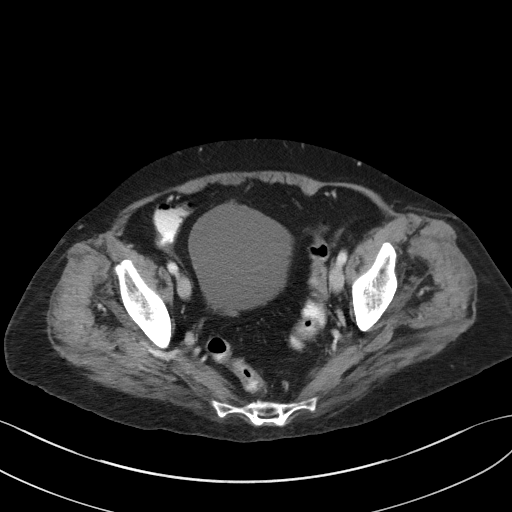
[im 32/95  soft-tissue]
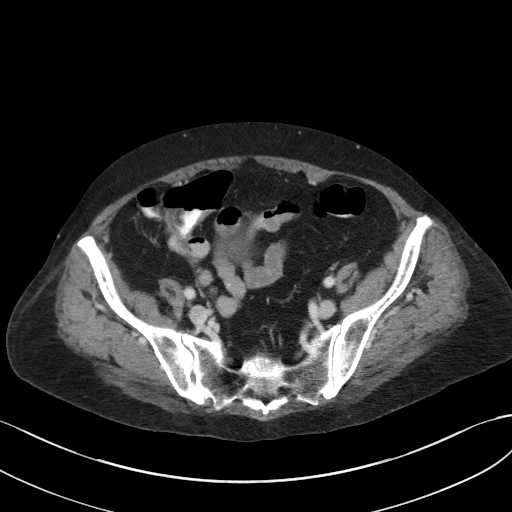
[im 38/95  soft-tissue]
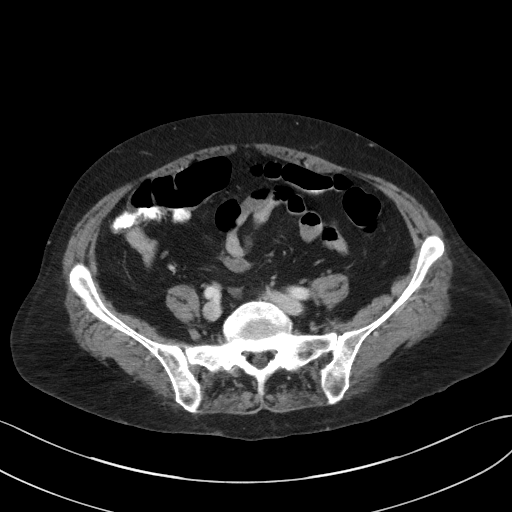
[im 51/95  soft-tissue]
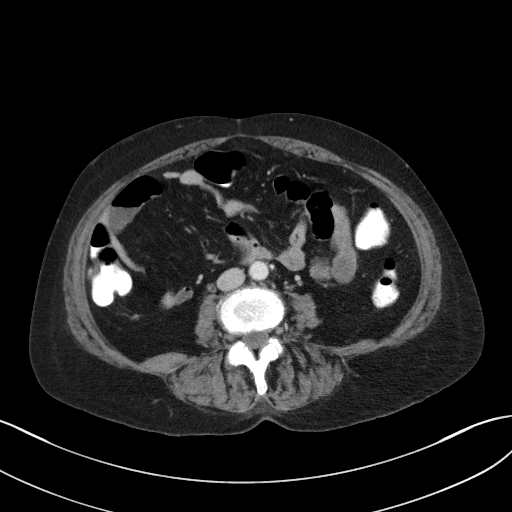
[im 57/95  soft-tissue]
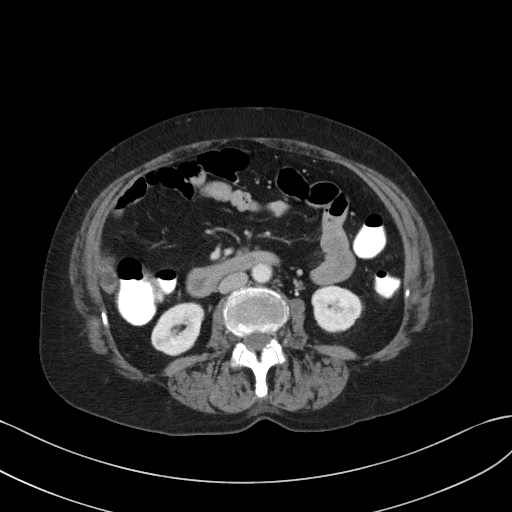
[im 63/95  soft-tissue]
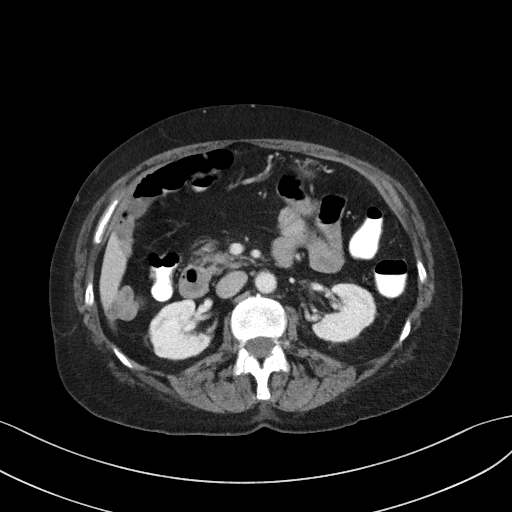
[im 63/95  bone]
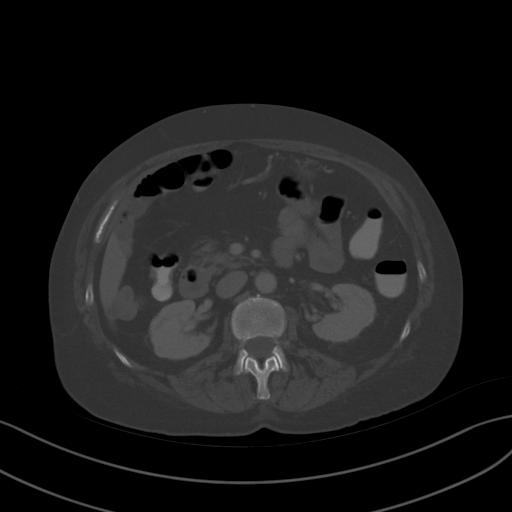
[im 69/95  soft-tissue]
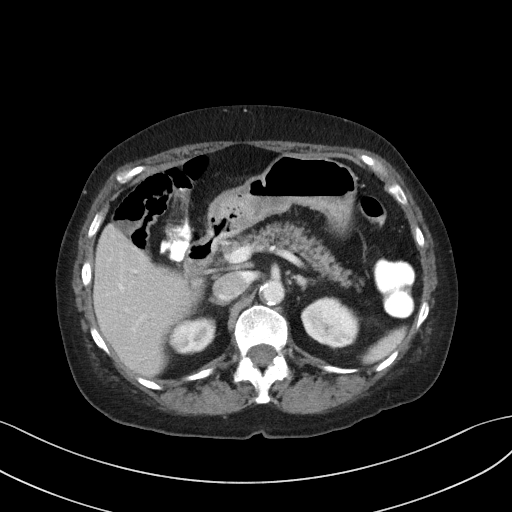
[im 76/95  soft-tissue]
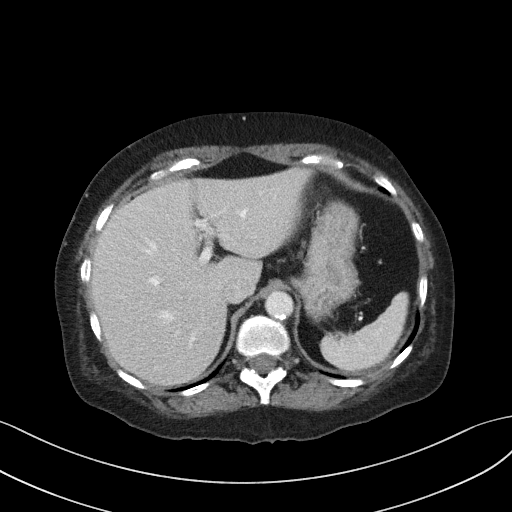
[im 82/95  soft-tissue]
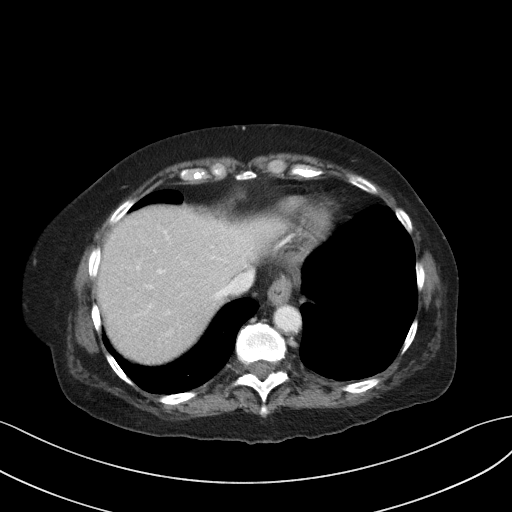
[im 88/95  soft-tissue]
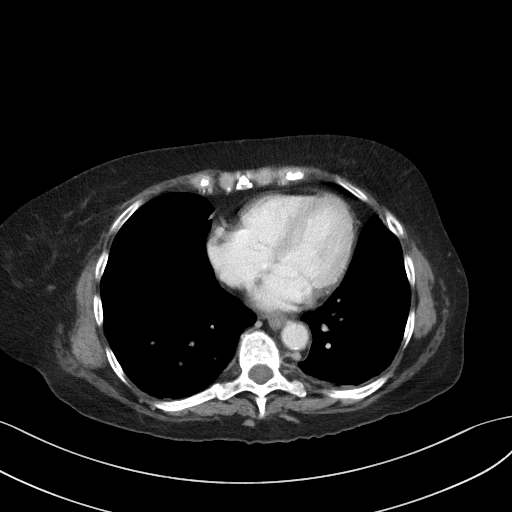

[Series 5: coronal st · coronal · 0.69mm/px · 3 of 89 slices shown]
[im 30/89  soft-tissue]
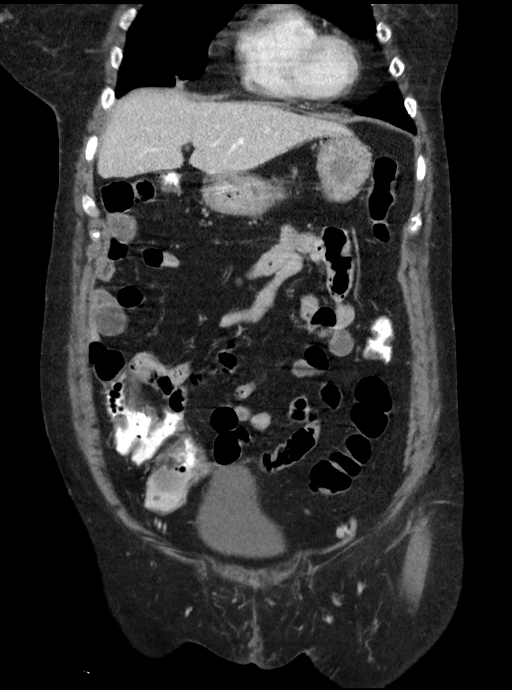
[im 40/89  soft-tissue]
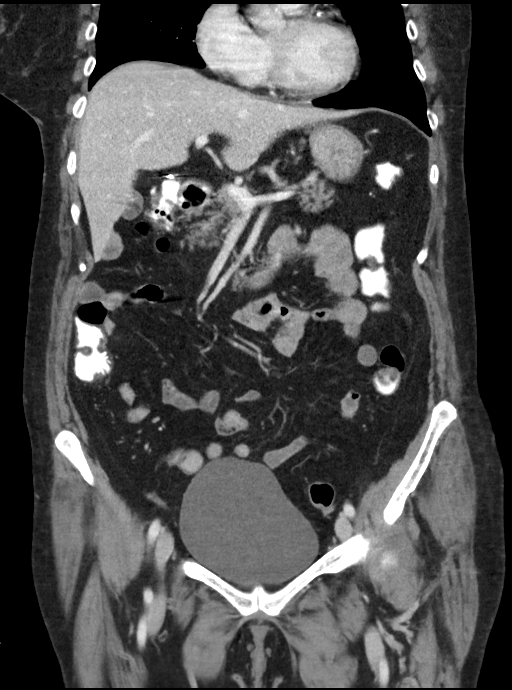
[im 49/89  soft-tissue]
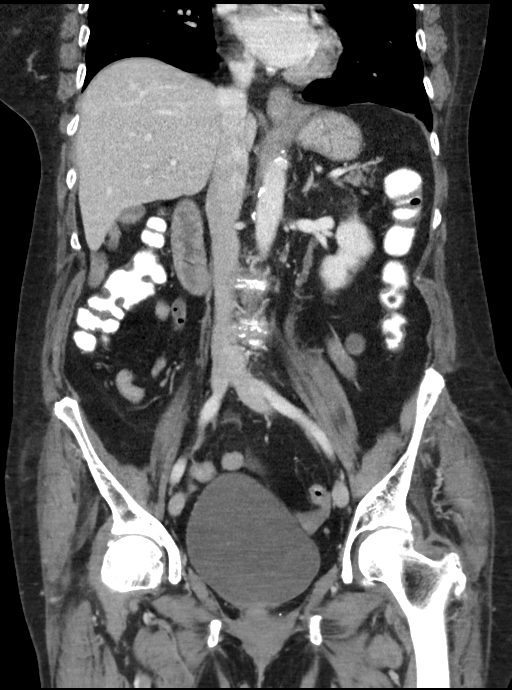

[16 of 46 positions shown; findings below may reference images not displayed]

FINDINGS: Lower chest: Scattered coronary calcifications. No pleural or
pericardial effusion. Subpleural linear scarring or atelectasis
posteriorly in both lower lobes.

Hepatobiliary: No focal liver abnormality is seen. Status post
cholecystectomy. No biliary dilatation.

Pancreas: Unremarkable. No pancreatic ductal dilatation or
surrounding inflammatory changes.

Spleen: Normal in size without focal abnormality.

Adrenals/Urinary Tract: Normal adrenals. No renal mass or
hydronephrosis. No ureterectasis. Urinary bladder physiologically
distended.

Stomach/Bowel: Stomach and small bowel decompressed. Appendix
surgically absent. The colon is nondilated, unremarkable.

Vascular/Lymphatic: Scattered aortoiliac calcified plaque without
aneurysm or stenosis. Portal vein patent. No abdominal or pelvic
adenopathy.

Reproductive: Status post hysterectomy. No adnexal masses.

Other: No ascites.  No free air.

Musculoskeletal: Mild spondylitic changes in the lower lumbar spine.
No fracture or other acute bone abnormality.
IMPRESSION: 1. No acute abdominal findings.
2. Coronary and aortoiliac  atherosclerosis ([id number]-170.0).

## 2020-08-01 IMAGING — DX DG HIP (WITH OR WITHOUT PELVIS) 1V PORT*L*
2 series · 2 of 2 positions shown · non-contrast
Comparison: None.

CLINICAL DATA: Left hip pain

EXAM:
DG HIP (WITH OR WITHOUT PELVIS) 1V PORT LEFT

[pelvis ap]
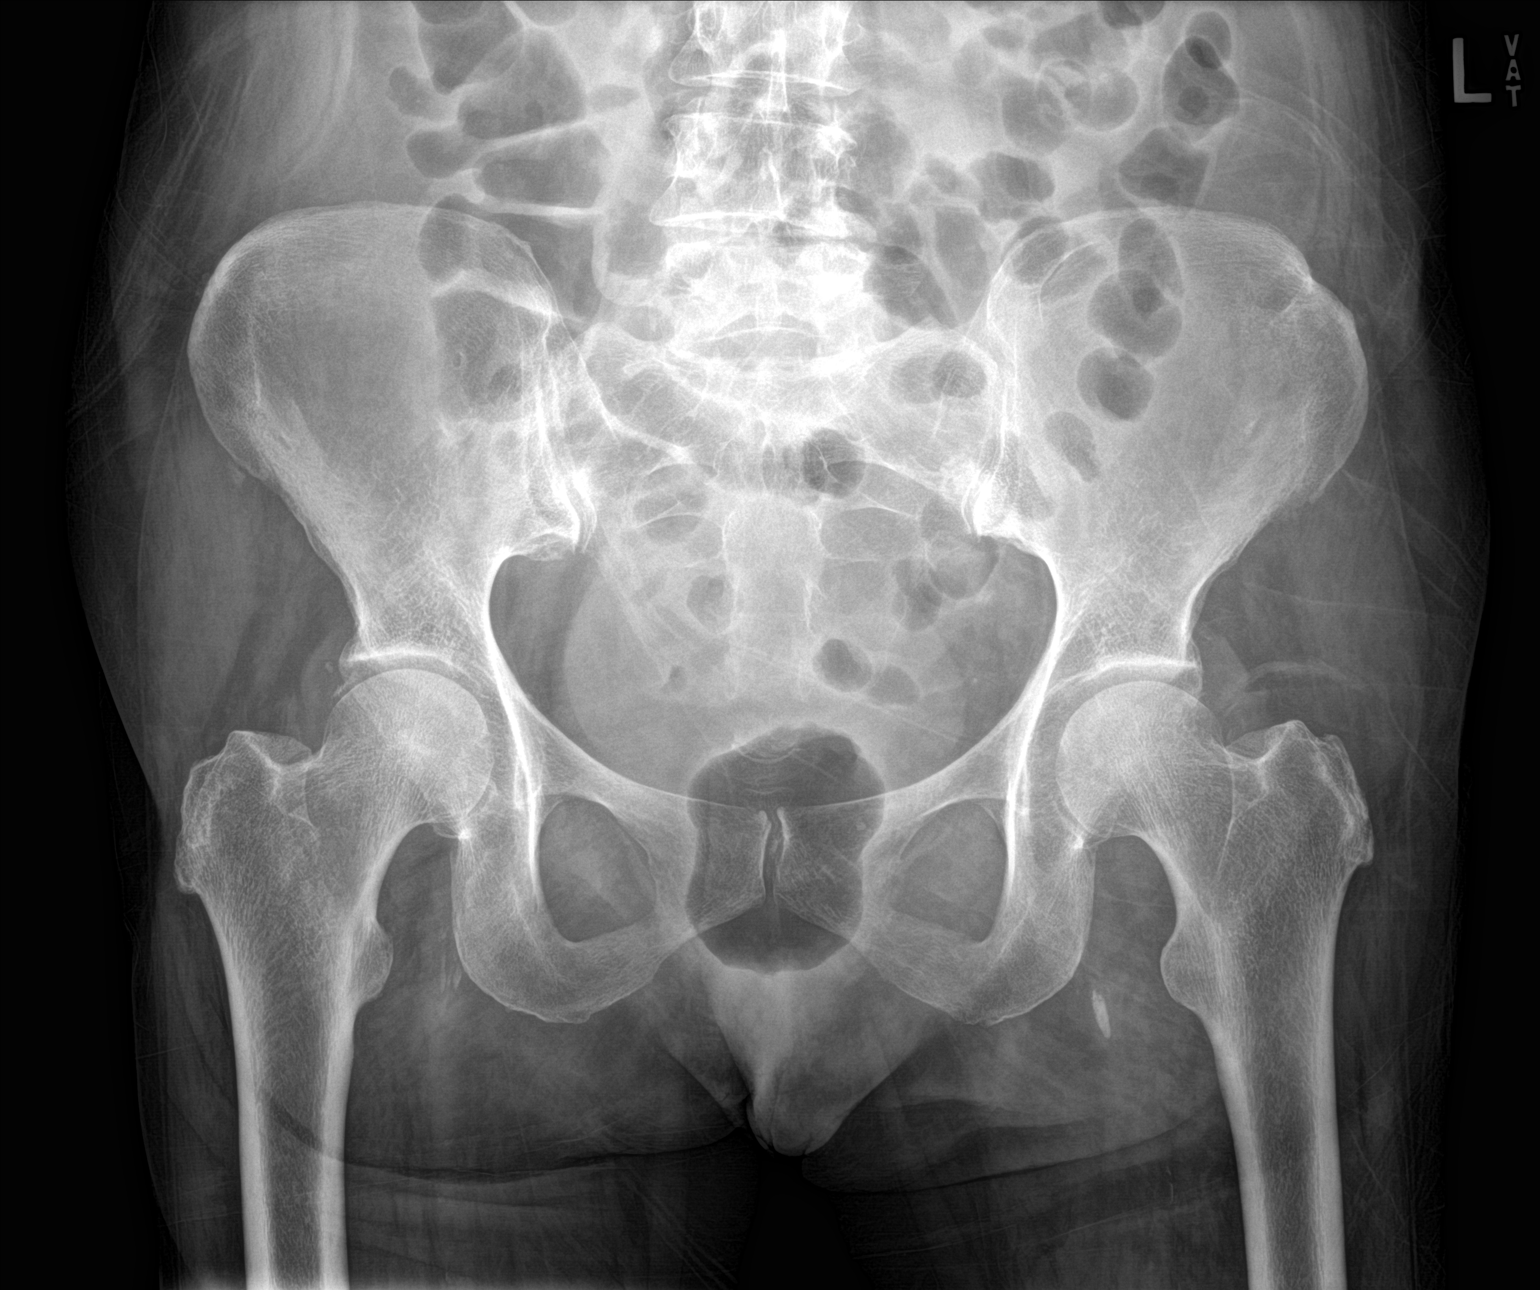

[hip ap]
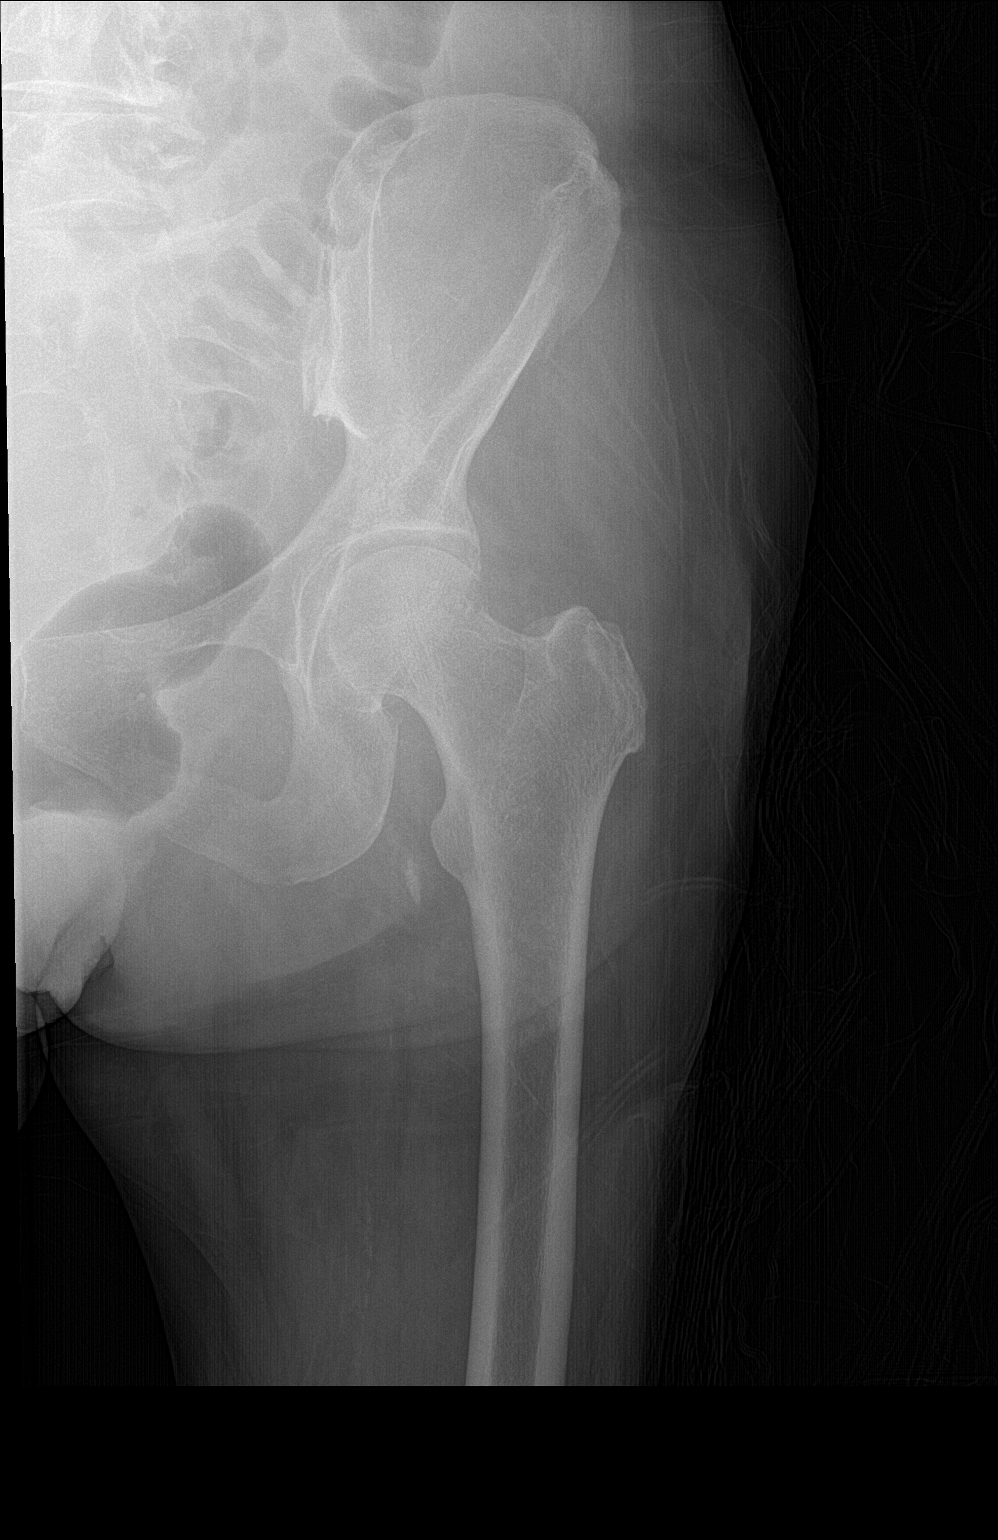

[2 of 2 positions shown; findings below may reference images not displayed]

FINDINGS: Early spurring in the hip joints bilaterally. SI joints are
symmetric and unremarkable. No acute bony abnormality. Specifically,
no fracture, subluxation, or dislocation.
IMPRESSION: Early spurring in the hip joints bilaterally. No acute bony
abnormality.

## 2020-08-02 ENCOUNTER — Ambulatory Visit: Payer: Medicare PPO | Admitting: Podiatry

## 2020-08-03 IMAGING — DX DG CHEST 1V PORT
2 series · 2 of 2 positions shown · non-contrast
Comparison: Chest x-ray dated January 14, 2018.

CLINICAL DATA: Cough.

EXAM:
PORTABLE CHEST 1 VIEW

[chest ap (1 of 2)]
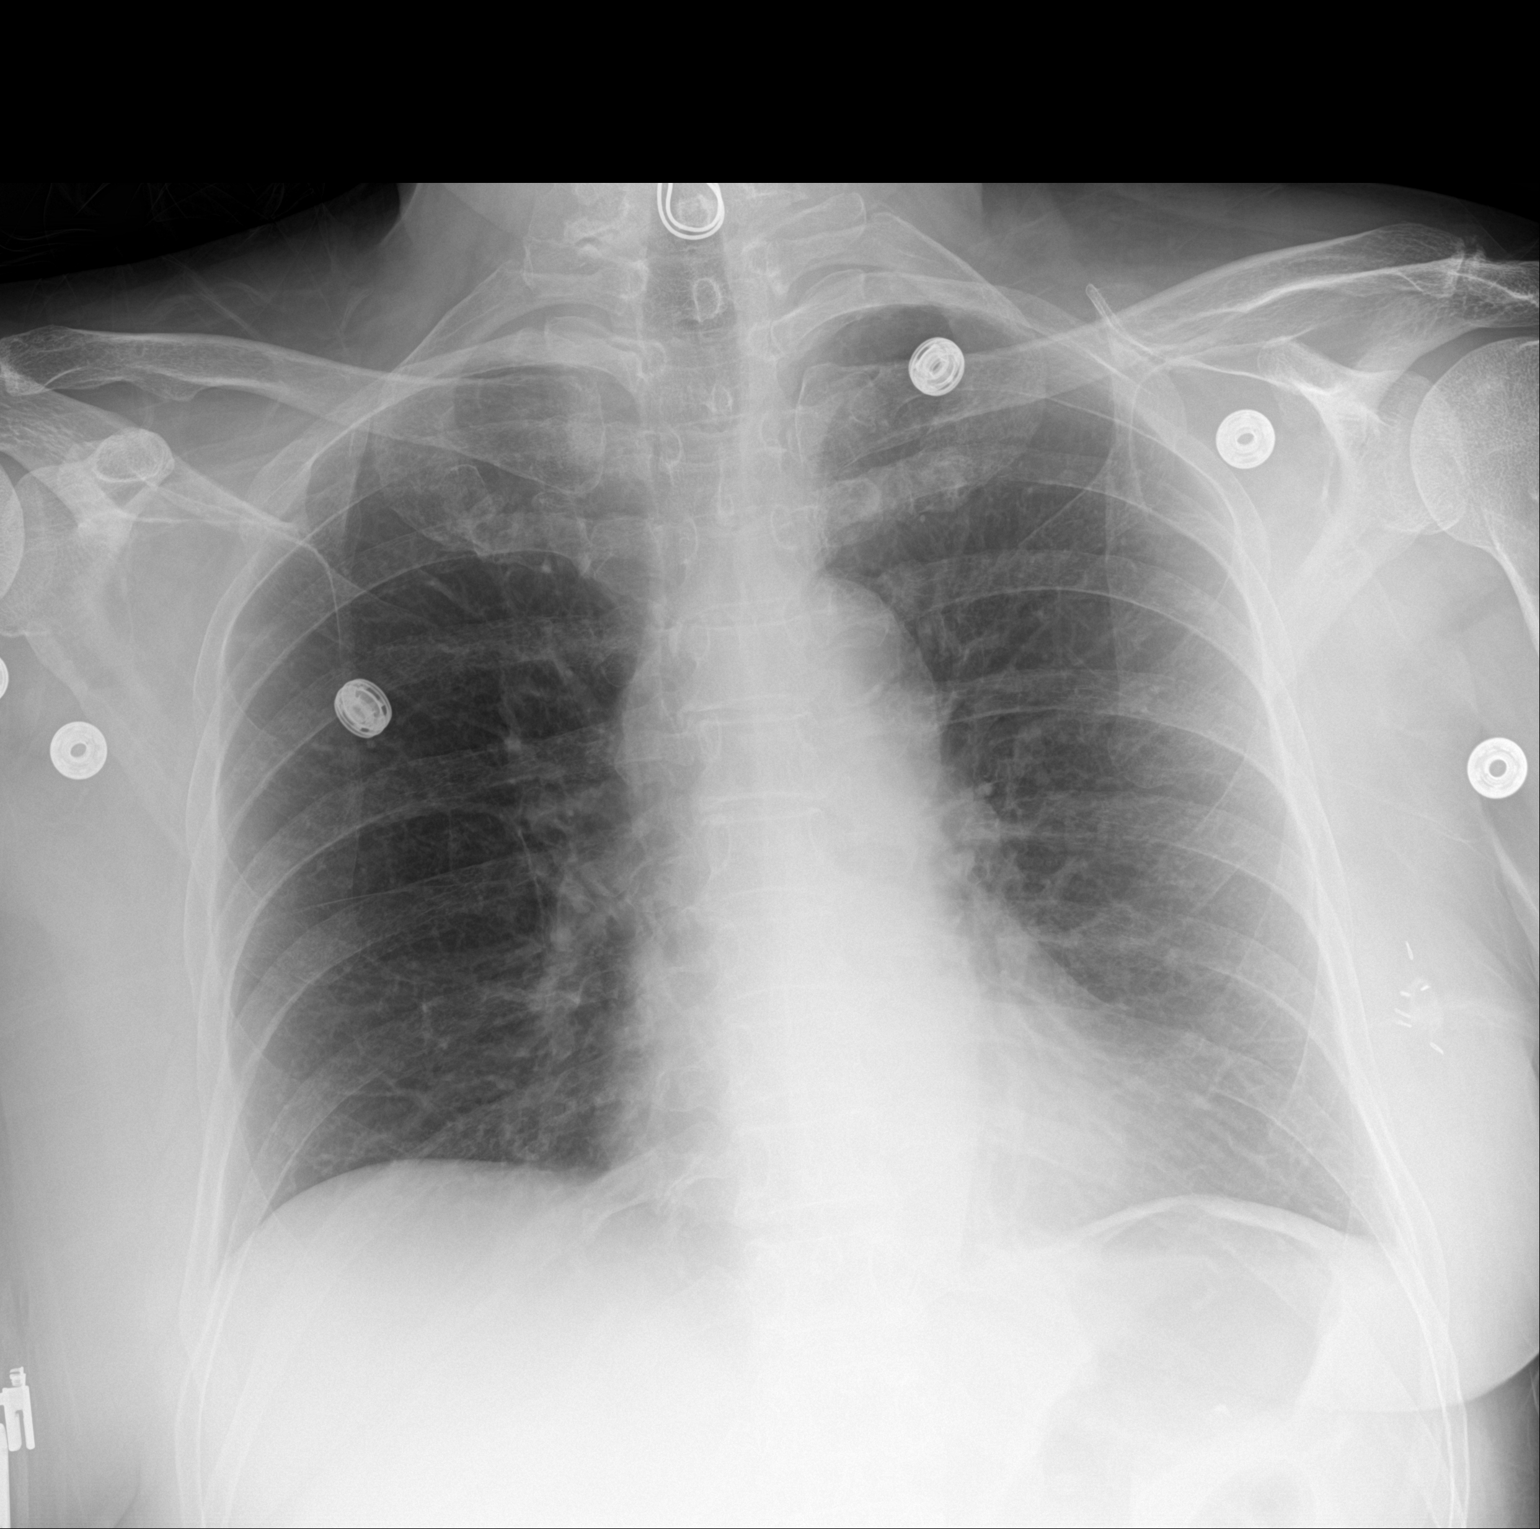

[chest ap (2 of 2)]
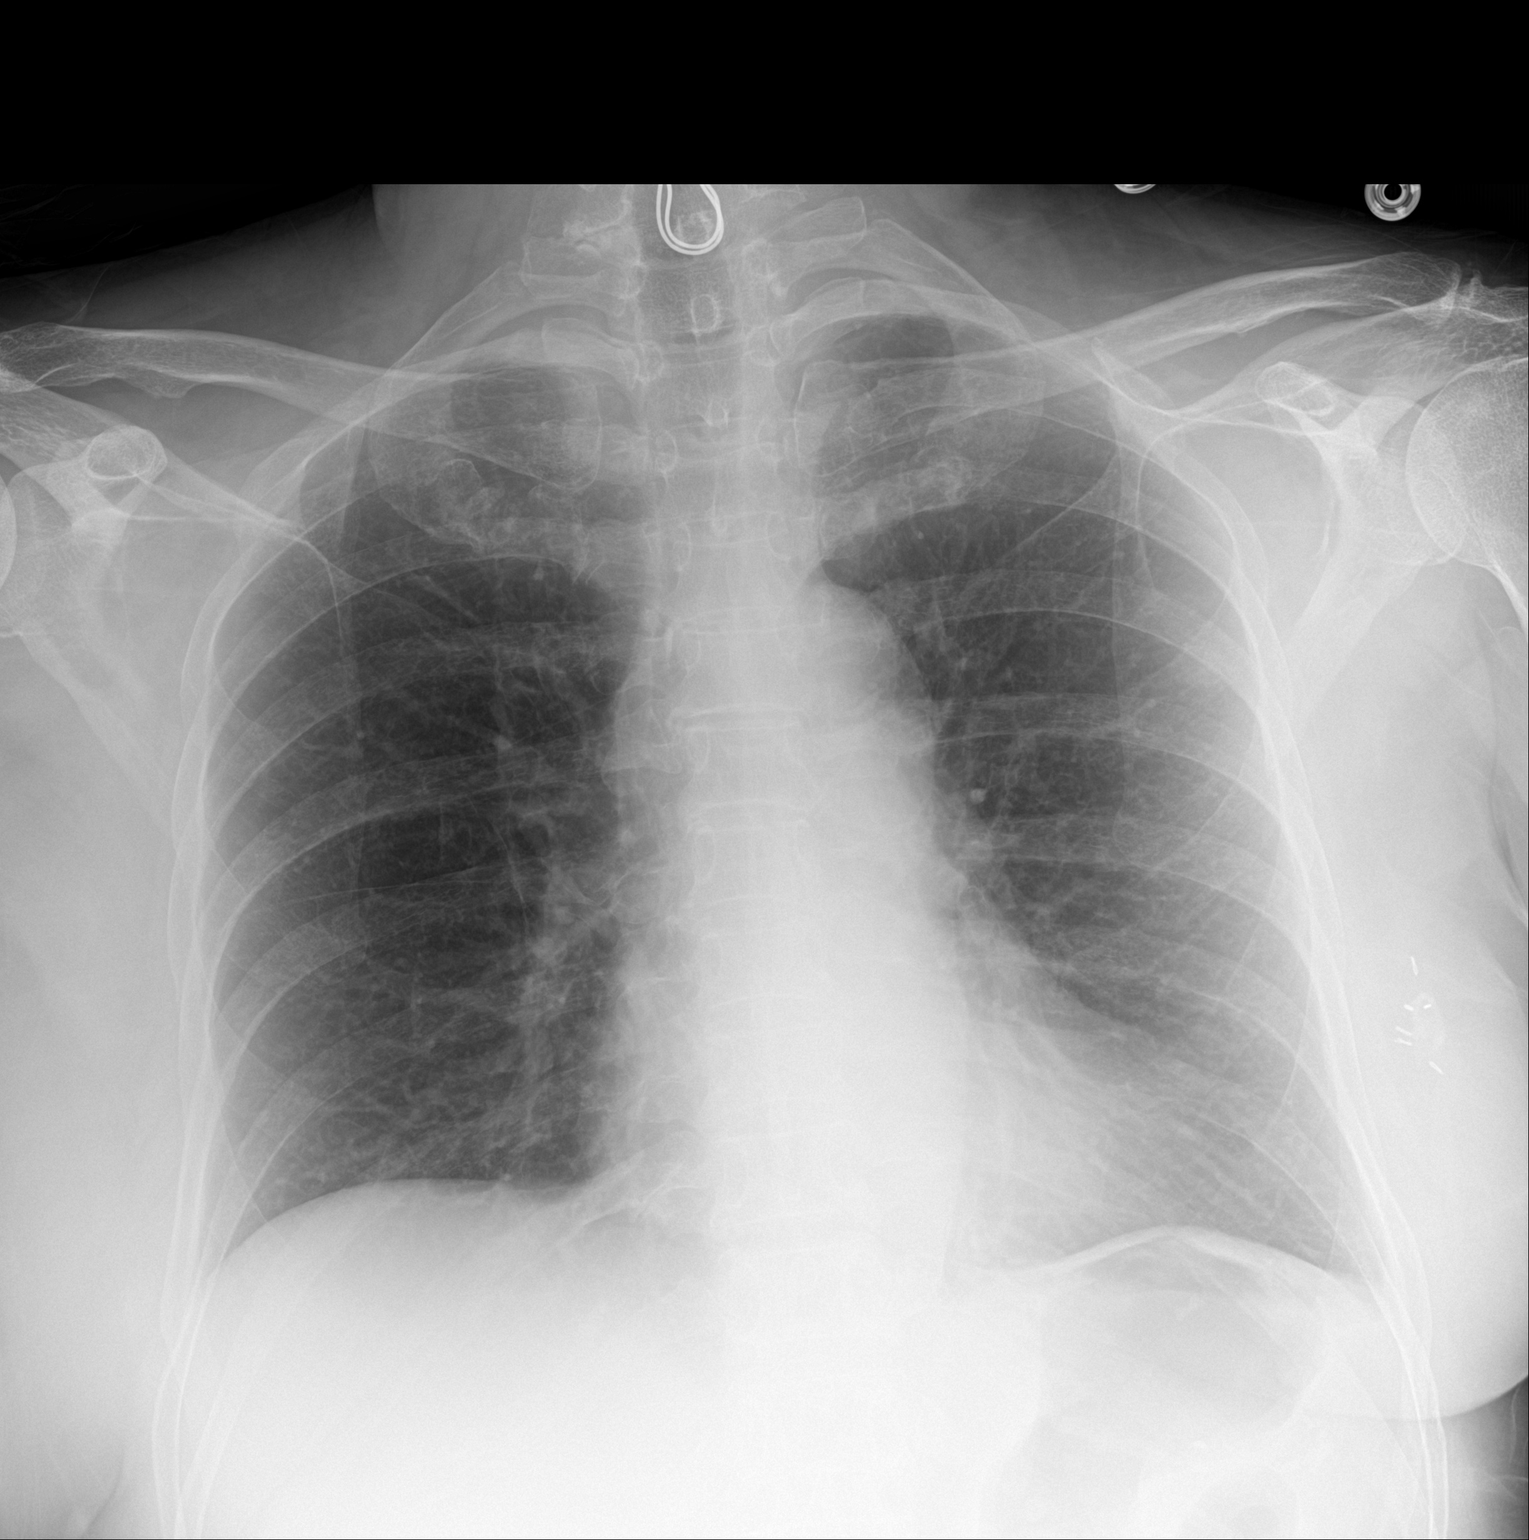

[2 of 2 positions shown; findings below may reference images not displayed]

FINDINGS: The heart size and mediastinal contours are within normal limits.
Normal pulmonary vascularity. No focal consolidation, pleural
effusion, or pneumothorax. No acute osseous abnormality. Unchanged
surgical clips in the left breast.
IMPRESSION: No active disease.

## 2020-08-03 IMAGING — MR MR HIP*L* W/O CM
4 of 5 series · 24 of 40 positions shown · non-contrast
Comparison: Left hip x-rays dated January 15, 2018.

CLINICAL DATA: Left hip pain.  No trauma.

EXAM:
MR OF THE LEFT HIP WITHOUT CONTRAST
TECHNIQUE: Multiplanar, multisequence MR imaging was performed. No intravenous
contrast was administered.

[Series 3: T1 · coronal · 4.0mm · 0.78mm/px · 7 of 26 slices shown]
[im 1/26]
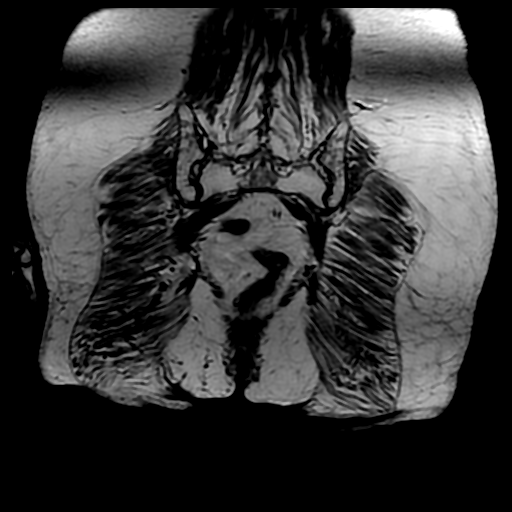
[im 5/26]
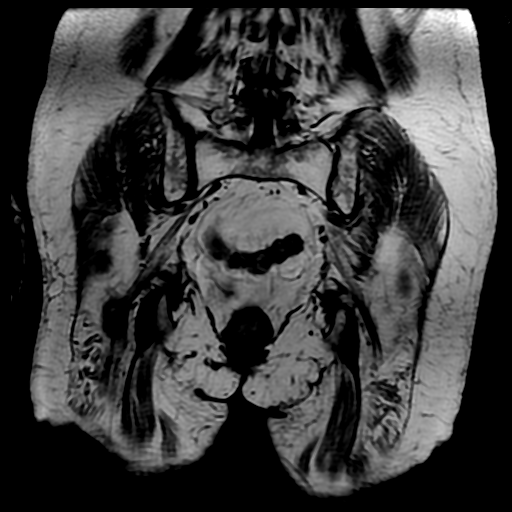
[im 9/26]
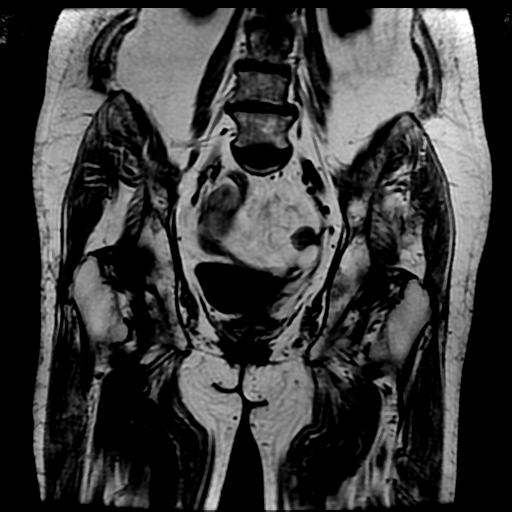
[im 13/26]
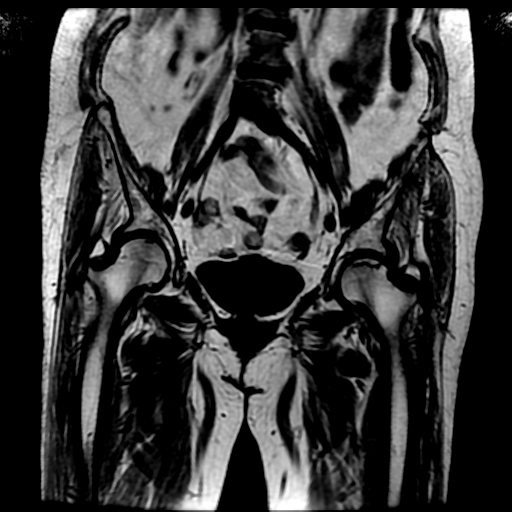
[im 17/26]
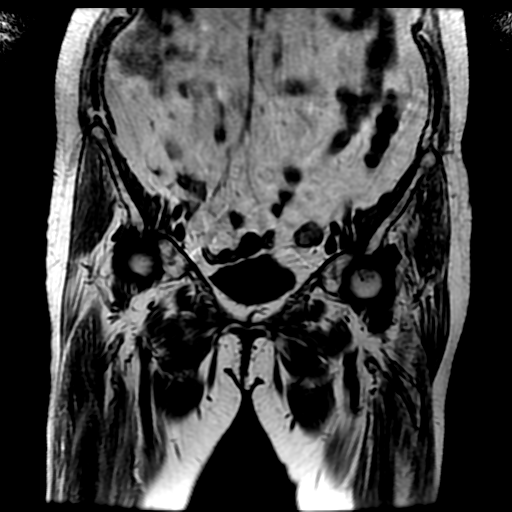
[im 21/26]
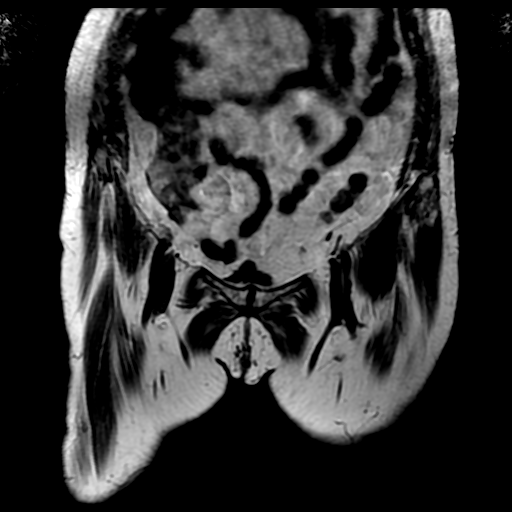
[im 26/26]
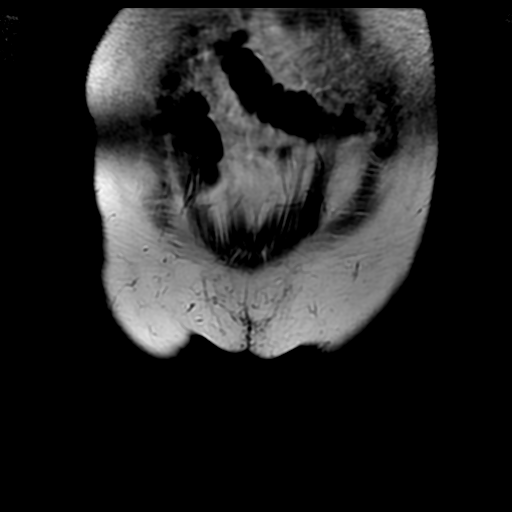

[Series 4: T2 · coronal · 4.0mm · 1.56mm/px · 8 of 26 slices shown (1 of 2)]
[im 1/26]
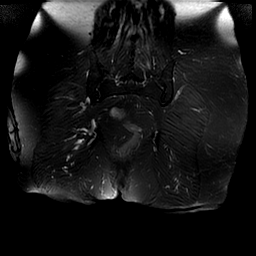
[im 4/26]
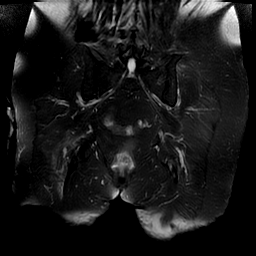
[im 8/26]
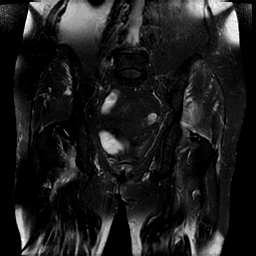
[im 11/26]
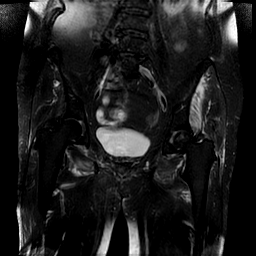
[im 15/26]
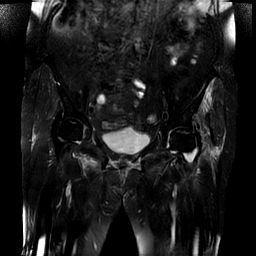
[im 18/26]
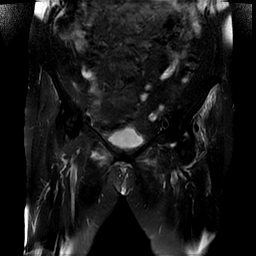
[im 22/26]
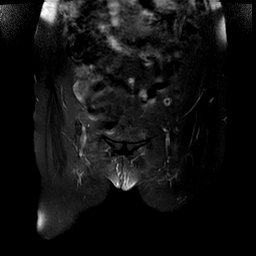
[im 26/26]
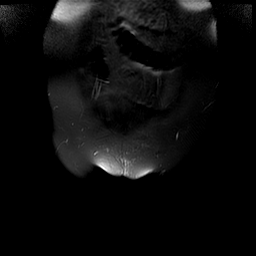

[Series 5: T2 · axial · 4.0mm · 0.35mm/px · z∈[-89,+26]mm · 6 of 28 slices shown (2 of 2)]
[im 1/28]
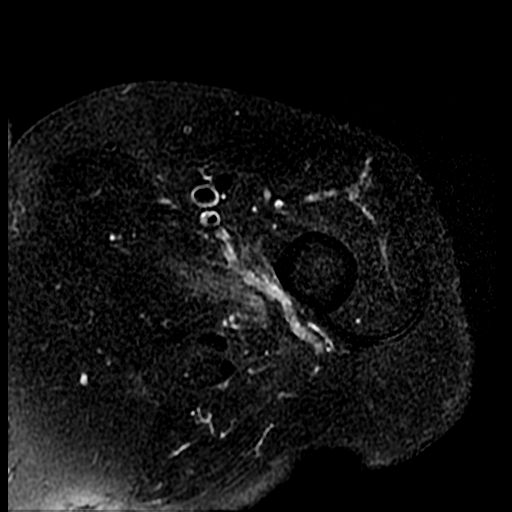
[im 4/28]
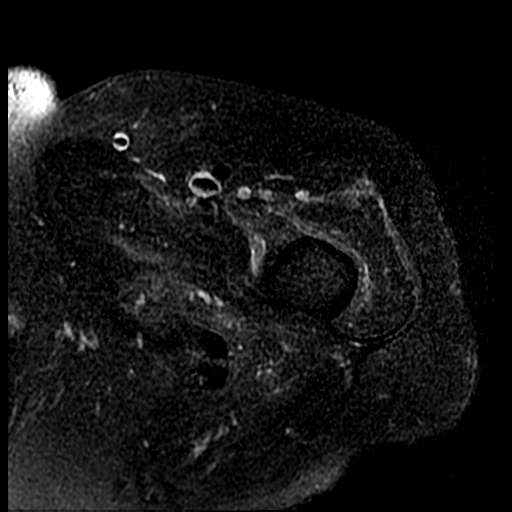
[im 7/28]
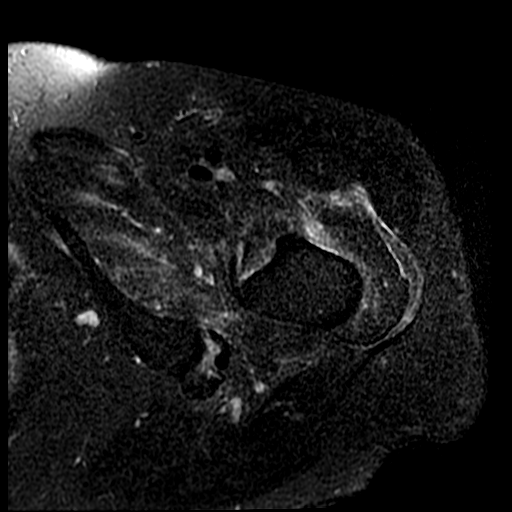
[im 11/28]
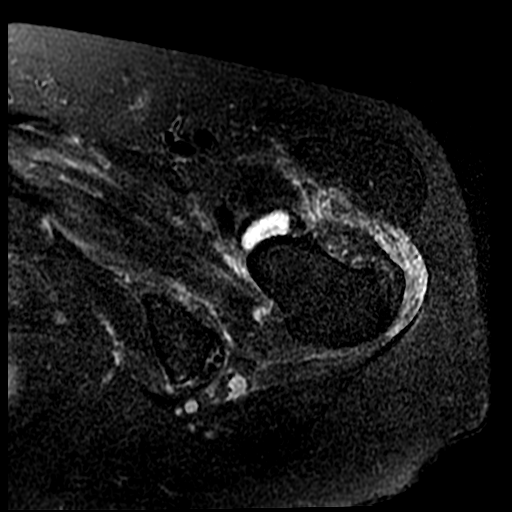
[im 14/28]
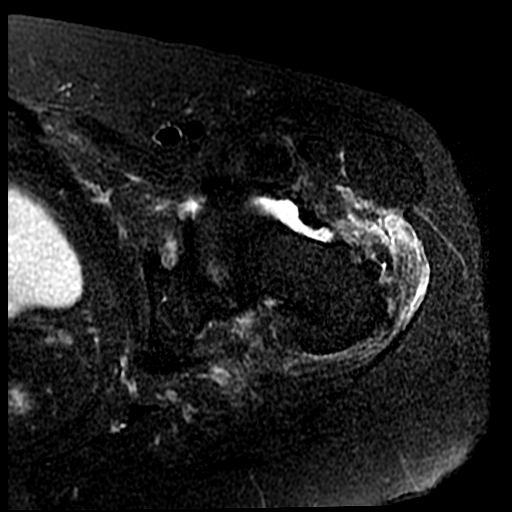
[im 24/28]
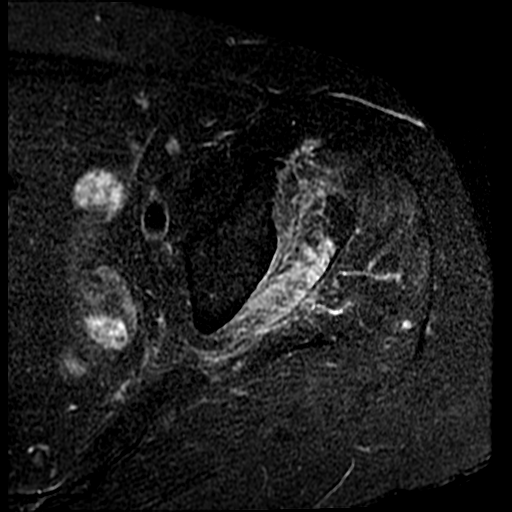

[Series 6: PD · sagittal · 4.0mm · 0.35mm/px · 3 of 25 slices shown]
[im 4/25]
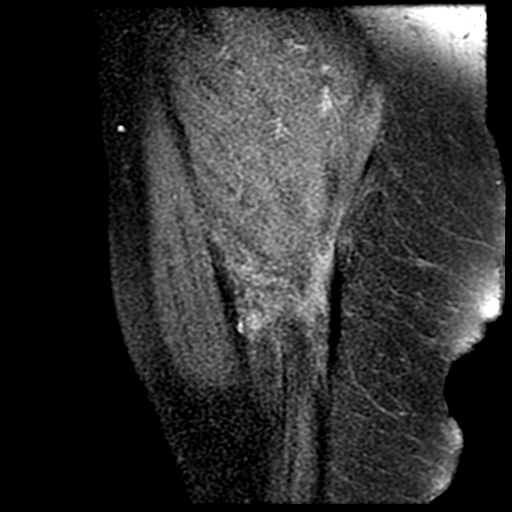
[im 14/25]
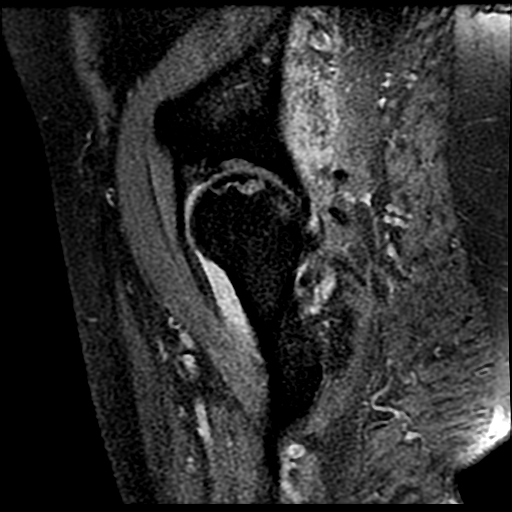
[im 21/25]
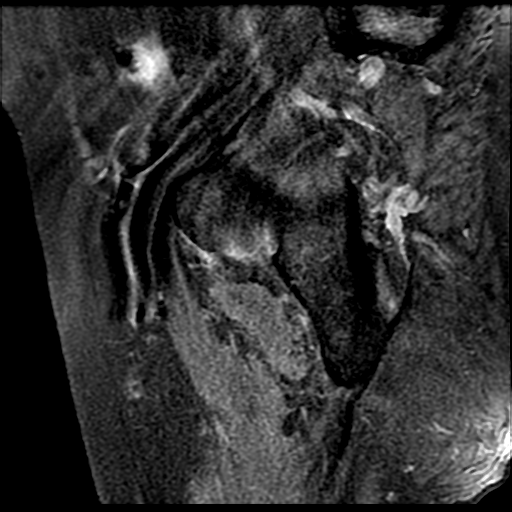

[24 of 40 positions shown; findings below may reference images not displayed]

FINDINGS: Bones: Greater than right serpiginous signal changes within the left
femoral heads, consistent with avascular necrosis. No significant
surrounding marrow edema. No fracture or dislocation. No focal bone
lesion. The visualized sacroiliac joints and symphysis pubis appear
normal.

Articular cartilage and labrum

Articular cartilage: No focal chondral defect or subchondral signal
abnormality identified.

Labrum: There is no gross labral tear or paralabral abnormality.

Joint or bursal effusion

Joint effusion: Small left hip joint effusion. No right hip joint
effusion.

Bursae: No focal periarticular fluid collection.

Muscles and tendons

Muscles and tendons: Partial tear of the left gluteus minimus
tendon. Mild bilateral gluteus medius tendinosis. Moderate bilateral
hamstring tendinosis. The iliopsoas tendons are unremarkable.
Prominent edema within the left gluteus minimus muscle. Mild edema
within the bilateral adductor musculature and inferior left gluteus
medius muscle. No muscle atrophy.

Other findings

Miscellaneous: The visualized internal pelvic contents appear
unremarkable.
IMPRESSION: 1. Mild left greater than right femoral head avascular necrosis
without surrounding marrow edema.
2. Small left hip joint effusion, nonspecific. This could be
reactive to the femoral head avascular necrosis or, given
chondrocalcinosis seen on CT, may reflect underlying CPPD
arthropathy. If there is clinical concern for septic arthritis,
arthrocentesis can be performed.
3. Partial tear of the left gluteus minimus tendon with significant
muscle strain.
4. Mild bilateral gluteus medius and moderate bilateral hamstring
tendinosis.

## 2020-08-04 ENCOUNTER — Encounter: Payer: Self-pay | Admitting: Podiatry

## 2020-08-04 NOTE — Progress Notes (Signed)
Subjective:  Patient ID: Ebony Scott, female    DOB: 1950/06/18,  MRN: [id number]  Chief Complaint  Patient presents with   Routine Post Op    POST OP DOS 6.3.22    DOS: 07/22/2020 Procedure: Right second digit internal amputation  69 y.o. female returns for post-op check.  Patient states she is doing well.  She denies any other acute complaints she would like to discuss next treatment options.  She wants to make sure that is healing okay.  Review of Systems: Negative except as noted in the HPI. Denies N/V/F/Ch.  Past Medical History:  Diagnosis Date   Acute deep vein thrombosis (DVT) of right lower extremity (HCC) 12/13/2017   Allergic rhinitis    Allergy    SEASONAL   Anemia    Angio-edema    Anxiety    pt denies   Arthritis    Phreesia 10/21/2019   Asthma    Asthma    [employer]reesia 10/21/2019   Breast cancer ([place]) 1998   in remission   Cataract    REMOVED   Clostridium difficile colitis 01/14/2018   Clostridium difficile diarrhea 76/80/8811   Complication of anesthesia    "had hard time waking up from it several times" (02/20/2012)   Depression    "some; don't take anything for it" (02/20/2012)   Diverticulosis    DVT (deep venous thrombosis) (HCC)    Exertional dyspnea    Fibromyalgia 11/2011   GERD (gastroesophageal reflux disease)    Graves disease    Headache(784.0)    "related to allergies; more at different times during the year" (02/20/2012)   Hemorrhoids    Hiatal hernia    back and neck   Hx of adenomatous colonic polyps 04/12/2016   Hypercholesteremia    good cholesterol is high   Hypothyroidism    IBS (irritable bowel syndrome)    Moderate persistent asthma    -FeV1 72% 2011, -IgE 102 2011, CT sinus Neg 2011   Osteoporosis    on reclast yearly   Peripheral vascular disease ([place]) 2019   DVTs   Pneumonia 04/2011; ~ 11/2011   "double; single" (02/20/2012)   Recurrent upper respiratory infection (URI)    Seronegative rheumatoid arthritis ([place])    Dr.  Lahoma Rocker   SIRS (systemic inflammatory response syndrome) ([place]) 02/10/2018   Tracheobronchomalacia     Current Outpatient Medications:    acetaminophen (TYLENOL) 650 MG CR tablet, Take 1,300 mg by mouth every 8 (eight) hours as needed for pain., Disp: , Rfl:    albuterol (PROVENTIL) (2.5 MG/3ML) 0.083% nebulizer solution, Take 3 mLs (2.5 mg total) by nebulization in the morning and at bedtime. Along with flutter valve, Disp: 180 mL, Rfl: 5   albuterol (VENTOLIN HFA) 108 (90 Base) MCG/ACT inhaler, INHALE 2 PUFFS BY MOUTH INTO THE LUNGS EVERY 4 HOURS AS NEEDED FOR WHEEZING OR SHORTNESS OF BREATH, Disp: 8.5 g, Rfl: 3   allopurinol (ZYLOPRIM) 100 MG tablet, Take 100 mg by mouth daily., Disp: , Rfl:    Alpha-D-Galactosidase (BEANO PO), Take 2-3 capsules by mouth 3 (three) times daily with meals., Disp: , Rfl:    Alpha-Lipoic Acid 600 MG CAPS, Take 600 mg by mouth daily. , Disp: , Rfl:    ammonium lactate (AMLACTIN) 12 % cream, Apply topically as needed for dry skin. (Patient taking differently: Apply 1 g topically at bedtime.), Disp: 385 g, Rfl: 2   atorvastatin (LIPITOR) 10 MG tablet, TAKE 1 TABLET BY MOUTH DAILY, Disp: 90 tablet,  Rfl: 3   azelastine (ASTELIN) 0.1 % nasal spray, USE 2 SPRAYS IN EACH NOSTRIL DAILY AS DIRECTED (Patient taking differently: Place 2 sprays into both nostrils daily as needed for allergies.), Disp: 30 mL, Rfl: 5   benzonatate (TESSALON) 200 MG capsule, TAKE 1 CAPSULE BY MOUTH TWICE DAILY AS NEEDED FOR COUGH, Disp: 20 capsule, Rfl: 1   Calcium Citrate (CITRACAL PO), Take 1 tablet by mouth in the morning and at bedtime., Disp: , Rfl:    CALCIUM-MAGNESIUM PO, Take 1 tablet by mouth 2 (two) times daily., Disp: , Rfl:    cetirizine (ZYRTEC) 10 MG tablet, Take 10 mg by mouth daily., Disp: , Rfl:    cholecalciferol (VITAMIN D) 25 MCG (1000 UT) tablet, Take 1,000 Units by mouth daily. , Disp: , Rfl:    COVID-19 mRNA vaccine, Pfizer, 30 MCG/0.3ML injection, INJECT AS DIRECTED,  Disp: .3 mL, Rfl: 0   cyproheptadine (PERIACTIN) 4 MG tablet, TAKE 2 TABLETS BY MOUTH EVERY EVENING (Patient taking differently: Take 8 mg by mouth every evening.), Disp: 180 tablet, Rfl: 2   denosumab (PROLIA) 60 MG/ML SOLN injection, Inject 60 mg into the skin every 6 (six) months. Administer in upper arm, thigh, or abdomen, Disp: , Rfl:    DEXILANT 60 MG capsule, TAKE 1 CAPSULE BY MOUTH EACH MORNING (Patient taking differently: Take 60 mg by mouth daily.), Disp: 90 capsule, Rfl: 3   diclofenac Sodium (VOLTAREN) 1 % GEL, Apply 2 g topically 4 (four) times daily as needed (joint pain)., Disp: 100 g, Rfl: 6   doxycycline (VIBRA-TABS) 100 MG tablet, Take 1 tablet (100 mg total) by mouth 2 (two) times daily., Disp: 28 tablet, Rfl: 0   EPINEPHrine (EPIPEN 2-PAK) 0.3 mg/0.3 mL IJ SOAJ injection, Inject 0.3 mLs (0.3 mg total) into the muscle Once PRN. (Patient not taking: Reported on 07/07/2020), Disp: 0.3 mL, Rfl: 2   famotidine (PEPCID) 20 MG tablet, TAKE 1 TABLET BY MOUTH TWICE A DAY (Patient taking differently: Take 20 mg by mouth 2 (two) times daily.), Disp: 60 tablet, Rfl: 5   fluticasone (FLONASE) 50 MCG/ACT nasal spray, USE 2 SPRAYS IN EACH NOSTRIL DAILY, Disp: 16 g, Rfl: 2   folic acid (FOLVITE) 1 MG tablet, Take 1 mg by mouth daily., Disp: , Rfl:    furosemide (LASIX) 20 MG tablet, Take 1 tablet (20 mg total) by mouth every evening. Take 40 mg in the AM, and take additional 20 mg in the PM for swelling (Patient taking differently: Take 20 mg by mouth 2 (two) times daily. Take 40 mg in the AM, and take additional 20 mg in the PM for swelling), Disp: 30 tablet, Rfl: 3   furosemide (LASIX) 40 MG tablet, Take 1 tablet (40 mg total) by mouth daily as needed., Disp: 30 tablet, Rfl: 3   gabapentin (NEURONTIN) 600 MG tablet, TAKE 1 TABLET BY MOUTH DAILY (Patient taking differently: Take 600 mg by mouth at bedtime.), Disp: 90 tablet, Rfl: 3   guaiFENesin (MUCINEX) 600 MG 12 hr tablet, Take 1 tablet (600 mg  total) by mouth 2 (two) times daily as needed for cough or to loosen phlegm., Disp: 60 tablet, Rfl: 1   hydrocortisone 2.5 % cream, Apply topically 2 (two) times daily as needed (hemorrhoids)., Disp: 28 g, Rfl: 3   Lactase 9000 units TABS, Take 18,000-27,000 Units by mouth 4 (four) times daily as needed (dairy consumption)., Disp: , Rfl:    lidocaine (LIDODERM) 5 %, Place 1 patch onto the skin daily  as needed (pain). Remove & Discard patch within 12 hours or as directed by MD, Disp: , Rfl:    lidocaine (LIDODERM) 5 %, APPLY 1 PATCH TOPICALLY AS DIRECTED, Disp: 30 patch, Rfl: 0   magnesium oxide (MAG-OX) 400 MG tablet, Take 400 mg by mouth daily., Disp: , Rfl:    mirtazapine (REMERON SOL-TAB) 30 MG disintegrating tablet, DISSOLVE 1 TABLET BY MOUTH EVERY NIGHT AT BEDTIME, Disp: 90 tablet, Rfl: 1   MITIGARE 0.6 MG CAPS, Take 0.6 mg by mouth every other day., Disp: , Rfl:    montelukast (SINGULAIR) 10 MG tablet, TAKE 1 TABLET BY MOUTH DAILY (Patient taking differently: Take 10 mg by mouth every evening.), Disp: 30 tablet, Rfl: 5   mupirocin ointment (BACTROBAN) 2 %, APPLY TO BED SORES AS NEEDED, Disp: 22 g, Rfl: 0   Nystatin (GERHARDT'S BUTT CREAM) CREA, Apply 1 application topically daily as needed (hemorrhoids). NYSTATIN 100,000 UNIT/GM CR 100000 CRM- 60 g, ZINC OXIDE 20 % OINT 20 OINT- 60 g, HYDROCORTISONE 1 % CREAM- 60 g (Patient not taking: Reported on 07/07/2020), Disp: 1 each, Rfl: 11   Nystatin (GERHARDT'S BUTT CREAM) CREA, Apply to the affected areas daily as needed for hemorrhoids (Patient not taking: Reported on 07/07/2020), Disp: 1 each, Rfl: 11   nystatin (MYCOSTATIN) 100000 UNIT/ML suspension, SWISH AND SPIT 5 MLS TWICE DAILY (Patient taking differently: Use as directed 5 mLs in the mouth or throat at bedtime.), Disp: 473 mL, Rfl: 3   nystatin cream (MYCOSTATIN), Apply 1 application topically 2 (two) times daily. (Patient not taking: Reported on 07/07/2020), Disp: , Rfl:     nystatin-triamcinolone ointment (MYCOLOG), Apply topically 2 (two) times daily. (Patient taking differently: Apply 1 application topically 2 (two) times daily as needed (yeast infections).), Disp: 90 g, Rfl: 1   ondansetron (ZOFRAN) 4 MG tablet, Take 1 tablet (4 mg total) by mouth every 8 (eight) hours as needed for nausea or vomiting. (Patient not taking: Reported on 07/07/2020), Disp: 10 tablet, Rfl: 0   predniSONE (DELTASONE) 5 MG tablet, Take 15 mg by mouth daily with breakfast., Disp: , Rfl:    Prenatal Vit-Fe Fumarate-FA (PRENATAL MULTIVITAMIN) TABS tablet, Take 1 tablet by mouth daily., Disp: , Rfl:    Probiotic Product (ALIGN PO), Take 1 capsule by mouth daily., Disp: , Rfl:    Propylene Glycol (SYSTANE COMPLETE) 0.6 % SOLN, Place 1 drop into both eyes 2 (two) times daily as needed (dry eyes)., Disp: , Rfl:    Respiratory Therapy Supplies (FLUTTER) DEVI, Use as directed, Disp: 1 each, Rfl: 0   Salicylic Acid-Urea (KERASAL EX), Apply 1 application topically at bedtime., Disp: , Rfl:    sodium chloride HYPERTONIC 3 % nebulizer solution, Take by nebulization 2 (two) times daily., Disp: 750 mL, Rfl: 12   Spacer/Aero-Holding Chambers (AEROCHAMBER PLUS) inhaler, Use as instructed, Disp: 1 each, Rfl: 0   Tiotropium Bromide-Olodaterol (STIOLTO RESPIMAT) 2.5-2.5 MCG/ACT AERS, Inhale 2 puffs into the lungs daily., Disp: 4 g, Rfl: 3   XARELTO 20 MG TABS tablet, TAKE 1 TABLET BY MOUTH DAILY WITH SUPPER (Patient taking differently: Take 20 mg by mouth every evening.), Disp: 90 tablet, Rfl: 3  Current Facility-Administered Medications:    denosumab (PROLIA) injection 60 mg, 60 mg, Subcutaneous, Q6 months, [name], Warren T, MD, 60 mg at 03/31/20 1224  Social History   Tobacco Use  Smoking Status Passive Smoke Exposure - Never Smoker  Smokeless Tobacco Never  Tobacco Comments   Parents    Allergies  Allergen  Reactions   Dust Mite Extract Shortness Of Breath and Other (See Comments)    "sneezing"  (02/20/2012)   Molds & Smuts Shortness Of Breath   Morphine And Related Hives and Itching   Other Shortness Of Breath and Other (See Comments)    Grass and weeds "sneezing; filled sinuses" (02/20/2012)   Penicillins Rash and Other (See Comments)    "welts" (02/20/2012) Has patient had a PCN reaction causing immediate rash, facial/tongue/throat swelling, SOB or lightheadedness with hypotension: Unknown Has patient had a PCN reaction causing severe rash involving mucus membranes or skin necrosis: No Has patient had a PCN reaction that required hospitalization: No Has patient had a PCN reaction occurring within the last 10 years: No If all of the above answers are "NO", then may proceed with Cephalosporin use. Tolerated Ancef 02/16/20.    Reclast [Zoledronic Acid] Other (See Comments)    Fever, Put in hospital, dr said it was a reaction from a reaction    Rofecoxib Swelling    Vioxx REACTION: feet swelling   Shellfish Allergy Anaphylaxis, Shortness Of Breath, Itching and Rash   Tetracycline Hcl Nausea And Vomiting   Xolair [Omalizumab] Other (See Comments)    Caused Blood clot   Dilaudid [Hydromorphone Hcl] Itching   Hydrocodone-Acetaminophen Nausea And Vomiting   Levofloxacin Other (See Comments)    GI upset   Oxycodone Hcl Nausea And Vomiting   Paroxetine Nausea And Vomiting    Paxil    Baclofen Other (See Comments)    Altered mental status requiring 3 day hospital stay    Celecoxib Swelling    Feet swelling   Diltiazem Swelling   Lactose Intolerance (Gi) Other (See Comments)    Bloating and gas   Tree Extract Other (See Comments)    "tested and told I was allergic to it; never experienced a reaction to it" (02/20/2012)   Objective:  There were no vitals filed for this visit. There is no height or weight on file to calculate BMI. Constitutional Well developed. Well nourished.  Vascular Foot warm and well perfused. Capillary refill normal to all digits.   Neurologic Normal  speech. Oriented to person, place, and time. Epicritic sensation to light touch grossly present bilaterally.  Dermatologic Skin healing well without signs of infection. Skin edges well coapted without signs of infection.  Orthopedic: Tenderness to palpation noted about the surgical site.   Radiographs: 3 views of skeletally mature adult right foot: Arthroplasty noted at the head of the second metatarsal phalangeal joint. Assessment:   1. Post-operative state   2. Toe ulcer, right, with fat layer exposed ([place])    Plan:  Patient was evaluated and treated and all questions answered.  S/p foot surgery right -Progressing as expected post-operatively. -XR: See above -WB Status: Weightbearing as tolerated in surgical shoe -Sutures: Intact.  No clinical signs of dehiscence noted.  No complication noted. -Medications: None -Foot redressed.  No follow-ups on file.

## 2020-08-05 ENCOUNTER — Other Ambulatory Visit: Payer: Self-pay | Admitting: Critical Care Medicine

## 2020-08-05 ENCOUNTER — Encounter: Payer: Self-pay | Admitting: Internal Medicine

## 2020-08-05 ENCOUNTER — Other Ambulatory Visit: Payer: Self-pay

## 2020-08-05 ENCOUNTER — Ambulatory Visit: Payer: Medicare PPO | Admitting: Internal Medicine

## 2020-08-05 ENCOUNTER — Telehealth: Payer: Self-pay | Admitting: Pulmonary Disease

## 2020-08-05 VITALS — BP 160/79 | HR 90 | Temp 97.5°F | Wt 147.0 lb

## 2020-08-05 DIAGNOSIS — B379 Candidiasis, unspecified: Secondary | ICD-10-CM | POA: Diagnosis not present

## 2020-08-05 DIAGNOSIS — M069 Rheumatoid arthritis, unspecified: Secondary | ICD-10-CM | POA: Diagnosis not present

## 2020-08-05 DIAGNOSIS — M7989 Other specified soft tissue disorders: Secondary | ICD-10-CM

## 2020-08-05 DIAGNOSIS — J479 Bronchiectasis, uncomplicated: Secondary | ICD-10-CM

## 2020-08-05 DIAGNOSIS — M7021 Olecranon bursitis, right elbow: Secondary | ICD-10-CM | POA: Diagnosis not present

## 2020-08-05 MED ORDER — STIOLTO RESPIMAT 2.5-2.5 MCG/ACT IN AERS: 2.0000 | INHALATION_SPRAY | Freq: Every day | RESPIRATORY_TRACT | 5 refills | Status: DC

## 2020-08-05 NOTE — Telephone Encounter (Signed)
Called and spoke with patient. She stated that she was in need of a refill for her Stiolto to be sent to [facility]. Advised her that I have sent this in for her. She verbalized understanding.   Nothing further needed at time of call.

## 2020-08-05 NOTE — Progress Notes (Signed)
[facility]  Reason for Consult: Olecranon bursitis  Referring Provider: Dr Mardelle Matte   HPI:    Ebony Scott is a 70 y.o. female with PMHx as below who presents to the clinic for further evaluation of olecranon bursitis.  She was seen on 05/13/2020 with a complaint of new onset swelling in the right elbow in the setting of a recent right reverse shoulder replacement.  There was not much concern for septic bursitis at that time and she underwent triamcinolone injection. She was seen on 06/17/2020 for follow up and was noted to have a little bit of redness and bogginess around the right olecranon bursa.  She was started on doxycycline at that time. She was seen again 07/06/2020 for follow-up. She noted persistent pain and swelling around her right elbow that had started to track a little more proximally.  She also noted a large soft tissue swelling over the right long metacarpal phalangeal joint which she noted to be painful as well.  She had been on doxycycline.  Other than the joint discomfort she had no systemic fever or chills.  Her physical exam that day noted a fairly large red olecranon bursitis as well as her right long metacarpal phalangeal joint was large, red, warm, and swollen.  Both of these areas were aspirated.  She had also not been receiving her RA treatments due to infections.  She also felt that she was having voice hoarseness consitent with her prior Candida esophagiitis.  She presented 1 week later to orthopedic surgery clinic on 07/11/2020 and she had overall improvement in her symptoms.  Synovial fluid cultures from her elbow were 82% neutrophils.  Total white cells was not performed.  And cultures grew Candida albicans.  She was started on fluconazole at that time and her antibiotics were stopped.  She then had a follow-up visit on 07/20/2020 and was overall noted to have improvement but not necessarily a significant amount.  Fluconazole was extended which  she now finished about 2 days ago.  She completed a 21-day course.  Her right elbow has again shown improvement and currently is without any warmth or erythema and has good range of motion.  There are couple subcutaneous nontender nodules that are of unclear significance.  She does continue to have soft tissue swelling of her right metacarpal area, however, there is no pain with palpation, no warmth, no erythema.  It is possible that this represents more of a rheumatoid process versus an infectious process.  She is here today with her daughter to discuss next steps in regards to her antifungal therapy.  She also reports that she finished a course of doxycycline last night.   Patient's Medications  New Prescriptions   No medications on file  Previous Medications   ACETAMINOPHEN (TYLENOL) 650 MG CR TABLET    Take 1,300 mg by mouth every 8 (eight) hours as needed for pain.   ALBUTEROL (PROVENTIL) (2.5 MG/3ML) 0.083% NEBULIZER SOLUTION    Take 3 mLs (2.5 mg total) by nebulization in the morning and at bedtime. Along with flutter valve   ALBUTEROL (VENTOLIN HFA) 108 (90 BASE) MCG/ACT INHALER    INHALE 2 PUFFS BY MOUTH INTO THE LUNGS EVERY 4 HOURS AS NEEDED FOR WHEEZING OR SHORTNESS OF BREATH   ALLOPURINOL (ZYLOPRIM) 100 MG TABLET    Take 100 mg by mouth daily.   ALPHA-D-GALACTOSIDASE (BEANO PO)    Take 2-3 capsules by mouth 3 (three) times daily with  meals.   ALPHA-LIPOIC ACID 600 MG CAPS    Take 600 mg by mouth daily.    AMMONIUM LACTATE (AMLACTIN) 12 % CREAM    Apply topically as needed for dry skin.   ATORVASTATIN (LIPITOR) 10 MG TABLET    TAKE 1 TABLET BY MOUTH DAILY   AZELASTINE (ASTELIN) 0.1 % NASAL SPRAY    USE 2 SPRAYS IN EACH NOSTRIL DAILY AS DIRECTED   BENZONATATE (TESSALON) 200 MG CAPSULE    TAKE 1 CAPSULE BY MOUTH TWICE DAILY AS NEEDED FOR COUGH   CALCIUM CITRATE (CITRACAL PO)    Take 1 tablet by mouth in the morning and at bedtime.   CALCIUM-MAGNESIUM PO    Take 1 tablet by mouth 2 (two)  times daily.   CETIRIZINE (ZYRTEC) 10 MG TABLET    Take 10 mg by mouth daily.   CHOLECALCIFEROL (VITAMIN D) 25 MCG (1000 UT) TABLET    Take 1,000 Units by mouth daily.    COVID-19 MRNA VACCINE, PFIZER, 30 MCG/0.3ML INJECTION    INJECT AS DIRECTED   CYPROHEPTADINE (PERIACTIN) 4 MG TABLET    TAKE 2 TABLETS BY MOUTH EVERY EVENING   DENOSUMAB (PROLIA) 60 MG/ML SOLN INJECTION    Inject 60 mg into the skin every 6 (six) months. Administer in upper arm, thigh, or abdomen   DEXILANT 60 MG CAPSULE    TAKE 1 CAPSULE BY MOUTH EACH MORNING   DICLOFENAC SODIUM (VOLTAREN) 1 % GEL    Apply 2 g topically 4 (four) times daily as needed (joint pain).   DOXYCYCLINE (VIBRA-TABS) 100 MG TABLET    Take 1 tablet (100 mg total) by mouth 2 (two) times daily.   EPINEPHRINE (EPIPEN 2-PAK) 0.3 MG/0.3 ML IJ SOAJ INJECTION    Inject 0.3 mLs (0.3 mg total) into the muscle Once PRN.   FAMOTIDINE (PEPCID) 20 MG TABLET    TAKE 1 TABLET BY MOUTH TWICE A DAY   FLUTICASONE (FLONASE) 50 MCG/ACT NASAL SPRAY    USE 2 SPRAYS IN EACH NOSTRIL DAILY   FOLIC ACID (FOLVITE) 1 MG TABLET    Take 1 mg by mouth daily.   FUROSEMIDE (LASIX) 20 MG TABLET    Take 1 tablet (20 mg total) by mouth every evening. Take 40 mg in the AM, and take additional 20 mg in the PM for swelling   FUROSEMIDE (LASIX) 40 MG TABLET    Take 1 tablet (40 mg total) by mouth daily as needed.   GABAPENTIN (NEURONTIN) 600 MG TABLET    TAKE 1 TABLET BY MOUTH DAILY   GUAIFENESIN (MUCINEX) 600 MG 12 HR TABLET    Take 1 tablet (600 mg total) by mouth 2 (two) times daily as needed for cough or to loosen phlegm.   HYDROCORTISONE 2.5 % CREAM    Apply topically 2 (two) times daily as needed (hemorrhoids).   LACTASE 9000 UNITS TABS    Take 18,000-27,000 Units by mouth 4 (four) times daily as needed (dairy consumption).   LIDOCAINE (LIDODERM) 5 %    Place 1 patch onto the skin daily as needed (pain). Remove & Discard patch within 12 hours or as directed by MD   LIDOCAINE (LIDODERM) 5  %    APPLY 1 PATCH TOPICALLY AS DIRECTED   MAGNESIUM OXIDE (MAG-OX) 400 MG TABLET    Take 400 mg by mouth daily.   MIRTAZAPINE (REMERON SOL-TAB) 30 MG DISINTEGRATING TABLET    DISSOLVE 1 TABLET BY MOUTH EVERY NIGHT AT BEDTIME   MITIGARE 0.6 MG  CAPS    Take 0.6 mg by mouth every other day.   MONTELUKAST (SINGULAIR) 10 MG TABLET    TAKE 1 TABLET BY MOUTH DAILY   MUPIROCIN OINTMENT (BACTROBAN) 2 %    APPLY TO BED SORES AS NEEDED   NYSTATIN (GERHARDT'S BUTT CREAM) CREA    Apply 1 application topically daily as needed (hemorrhoids). NYSTATIN 100,000 UNIT/GM CR 100000 CRM- 60 g, ZINC OXIDE 20 % OINT 20 OINT- 60 g, HYDROCORTISONE 1 % CREAM- 60 g   NYSTATIN (GERHARDT'S BUTT CREAM) CREA    Apply to the affected areas daily as needed for hemorrhoids   NYSTATIN (MYCOSTATIN) 100000 UNIT/ML SUSPENSION    SWISH AND SPIT 5 MLS TWICE DAILY   NYSTATIN CREAM (MYCOSTATIN)    Apply 1 application topically 2 (two) times daily.   NYSTATIN-TRIAMCINOLONE OINTMENT (MYCOLOG)    Apply topically 2 (two) times daily.   ONDANSETRON (ZOFRAN) 4 MG TABLET    Take 1 tablet (4 mg total) by mouth every 8 (eight) hours as needed for nausea or vomiting.   PREDNISONE (DELTASONE) 5 MG TABLET    Take 15 mg by mouth daily with breakfast.   PRENATAL VIT-FE FUMARATE-FA (PRENATAL MULTIVITAMIN) TABS TABLET    Take 1 tablet by mouth daily.   PROBIOTIC PRODUCT (ALIGN PO)    Take 1 capsule by mouth daily.   PROPYLENE GLYCOL (SYSTANE COMPLETE) 0.6 % SOLN    Place 1 drop into both eyes 2 (two) times daily as needed (dry eyes).   RESPIRATORY THERAPY SUPPLIES (FLUTTER) DEVI    Use as directed   SALICYLIC ACID-UREA (KERASAL EX)    Apply 1 application topically at bedtime.   SODIUM CHLORIDE HYPERTONIC 3 % NEBULIZER SOLUTION    Take by nebulization 2 (two) times daily.   SPACER/AERO-HOLDING CHAMBERS (AEROCHAMBER PLUS) INHALER    Use as instructed   TIOTROPIUM BROMIDE-OLODATEROL (STIOLTO RESPIMAT) 2.5-2.5 MCG/ACT AERS    Inhale 2 puffs into the lungs  daily.   XARELTO 20 MG TABS TABLET    TAKE 1 TABLET BY MOUTH DAILY WITH SUPPER  Modified Medications   No medications on file  Discontinued Medications   No medications on file      Past Medical History:  Diagnosis Date   Acute deep vein thrombosis (DVT) of right lower extremity ([facility]) 12/13/2017   Allergic rhinitis    Allergy    SEASONAL   Anemia    Angio-edema    Anxiety    pt denies   Arthritis    Phreesia 10/21/2019   Asthma    Asthma    Phreesia 10/21/2019   Breast cancer ([place]) 1998   in remission   Cataract    REMOVED   Clostridium difficile colitis 01/14/2018   Clostridium difficile diarrhea 56/43/3295   Complication of anesthesia    "had hard time waking up from it several times" (02/20/2012)   Depression    "some; don't take anything for it" (02/20/2012)   Diverticulosis    DVT (deep venous thrombosis) ([facility])    Exertional dyspnea    Fibromyalgia 11/2011   GERD (gastroesophageal reflux disease)    Graves disease    Headache(784.0)    "related to allergies; more at different times during the year" (02/20/2012)   Hemorrhoids    Hiatal hernia    back and neck   Hx of adenomatous colonic polyps 04/12/2016   Hypercholesteremia    good cholesterol is high   Hypothyroidism    IBS (irritable bowel syndrome)  Moderate persistent asthma    -FeV1 72% 2011, -IgE 102 2011, CT sinus Neg 2011   Osteoporosis    on reclast yearly   Peripheral vascular disease ([place]) 2019   DVTs   Pneumonia 04/2011; ~ 11/2011   "double; single" (02/20/2012)   Recurrent upper respiratory infection (URI)    Seronegative rheumatoid arthritis ([place])    Dr. Lahoma Rocker   SIRS (systemic inflammatory response syndrome) ([facility]) 02/10/2018   Tracheobronchomalacia     Social History   Tobacco Use   Smoking status: Never    Passive exposure: Yes   Smokeless tobacco: Never   Tobacco comments:    Parents  Vaping Use   Vaping Use: Never used  Substance Use Topics   Alcohol use: Not Currently     Alcohol/week: 0.0 standard drinks   Drug use: No    Family History  Problem Relation Age of Onset   Allergies Mother    Heart disease Mother    Arthritis Mother    Lung cancer Mother    Diabetes Mother    Allergies Father    Heart disease Father    Arthritis Father    Stroke Father    Colon cancer Other        Maternal half aunt/Maternal half uncle   Colitis Daughter    Diabetes Maternal Grandfather     Allergies  Allergen Reactions   Dust Mite Extract Shortness Of Breath and Other (See Comments)    "sneezing" (02/20/2012)   Molds & Smuts Shortness Of Breath   Morphine And Related Hives and Itching   Other Shortness Of Breath and Other (See Comments)    Grass and weeds "sneezing; filled sinuses" (02/20/2012)   Penicillins Rash and Other (See Comments)    "welts" (02/20/2012) Has patient had a PCN reaction causing immediate rash, facial/tongue/throat swelling, SOB or lightheadedness with hypotension: Unknown Has patient had a PCN reaction causing severe rash involving mucus membranes or skin necrosis: No Has patient had a PCN reaction that required hospitalization: No Has patient had a PCN reaction occurring within the last 10 years: No If all of the above answers are "NO", then may proceed with Cephalosporin use. Tolerated Ancef 02/16/20.    Reclast [Zoledronic Acid] Other (See Comments)    Fever, Put in hospital, dr said it was a reaction from a reaction    Rofecoxib Swelling    Vioxx REACTION: feet swelling   Shellfish Allergy Anaphylaxis, Shortness Of Breath, Itching and Rash   Tetracycline Hcl Nausea And Vomiting   Xolair [Omalizumab] Other (See Comments)    Caused Blood clot   Dilaudid [Hydromorphone Hcl] Itching   Hydrocodone-Acetaminophen Nausea And Vomiting   Levofloxacin Other (See Comments)    GI upset   Oxycodone Hcl Nausea And Vomiting   Paroxetine Nausea And Vomiting    Paxil    Baclofen Other (See Comments)    Altered mental status requiring 3 day  hospital stay    Celecoxib Swelling    Feet swelling   Diltiazem Swelling   Lactose Intolerance (Gi) Other (See Comments)    Bloating and gas   Tree Extract Other (See Comments)    "tested and told I was allergic to it; never experienced a reaction to it" (02/20/2012)    Review of Systems  Constitutional:  Negative for chills and fever.  HENT:         No throat pain Voice hoarseness has improved.  Respiratory: Negative.    Cardiovascular: Negative.   Gastrointestinal: Negative.  Genitourinary: Negative.   Musculoskeletal:        + pain in feet Improved right elbow pain No pain in right MCP area     OBJECTIVE:    Vitals:   08/05/20 1508 08/05/20 1509  BP:  (!) 160/79  Pulse:  90  Temp:  (!) 97.5 F (36.4 C)  SpO2:  95%  Weight: 147 lb (66.7 kg)      Body mass index is 27.78 kg/m.  Physical Exam Constitutional:      General: She is not in acute distress.    Appearance: Normal appearance.  Pulmonary:     Effort: Pulmonary effort is normal. No respiratory distress.  Musculoskeletal:        General: Swelling and deformity present. No tenderness.     Comments: MSK changes c/w RA Right elbow with no pain or swelling.  No TTP.  Good ROM Right MCP soft tissue swelling.  No TTP. No warmth or erythema. Right foot in walking boot.  Neurological:     General: No focal deficit present.     Mental Status: She is alert and oriented to person, place, and time.  Psychiatric:        Mood and Affect: Mood normal.        Behavior: Behavior normal.     Labs and Microbiology:  CBC Latest Ref Rng & Units 04/29/2020 03/31/2020 03/21/2020  WBC 4.0 - 10.5 K/uL 10.1 14.7(H) 14.6(H)  Hemoglobin 12.0 - 15.0 g/dL 10.9(L) 9.5(L) 9.0(L)  Hematocrit 36.0 - 46.0 % 33.9(L) 28.8(L) 26.9(L)  Platelets 150.0 - 400.0 K/uL 214.0 251 260   CMP Latest Ref Rng & Units 04/29/2020 03/31/2020 03/21/2020  Glucose 70 - 99 mg/dL 147(H) 118(H) 249(H)  BUN 6 - 23 mg/dL 39(H) 31(H) 32(H)  Creatinine 0.40  - 1.20 mg/dL 1.47(H) 1.19(H) 1.34(H)  Sodium 135 - 145 mEq/L 139 140 141  Potassium 3.5 - 5.1 mEq/L 4.4 4.1 4.2  Chloride 96 - 112 mEq/L 100 99 100  CO2 19 - 32 mEq/L 28 28 25   Calcium 8.4 - 10.5 mg/dL 9.2 9.2 8.4(L)  Total Protein 6.0 - 8.3 g/dL 6.5 6.0(L) 5.9(L)  Total Bilirubin 0.2 - 1.2 mg/dL 0.4 0.5 0.6  Alkaline Phos 39 - 117 U/L 60 - -  AST 0 - 37 U/L 40(H) 48(H) 72(H)  ALT 0 - 35 U/L 61(H) 62(H) 97(H)       ASSESSMENT & PLAN:    1. Olecranon bursitis of right elbow  2. Candida albicans infection  3. Swelling of right hand  4. Rheumatoid arthritis  She completed 21 days of fluconazole which should be very active against Candida albicans after aspiration of her bursa indicated Candida infection on cultures.  She has also been taking antibiotics as well.  At this point, there does not appear to be any residual infection related to her right elbow as she has good ROM, no pain to palpation, and no erythema.  Additionally, her right MCP area does have some soft tissue fullness and swelling.  However, this area also is not warm or tender and does not suggest ongoing infection.  I would favor more of an RA process there.  Hopefully, she can resume RA therapy as this has been held due to chronic infections but currently I do not see an indication for further antifungal or antibiotics right now.       [facility] 08/05/2020, 3:40 PM   I spent 45  minutes dedicated to the care of this patient on the date of this encounter to include pre-visit review of records, face-to-face time with the patient discussing bursitis, candida infection, right hand swelling, and RA and post-visit ordering of testing.

## 2020-08-05 NOTE — Patient Instructions (Signed)
Thank you for coming to see me today. It was a pleasure seeing you.  To Do: I would continue to observe off antifungals and antibiotics Follow up with your rheumatologist   If you have any questions or concerns, please do not hesitate to call the office at [phone].  Take Care,   Jule Ser, DO

## 2020-08-08 ENCOUNTER — Other Ambulatory Visit: Payer: Self-pay | Admitting: Pulmonary Disease

## 2020-08-08 ENCOUNTER — Encounter: Payer: Self-pay | Admitting: Internal Medicine

## 2020-08-08 DIAGNOSIS — M0609 Rheumatoid arthritis without rheumatoid factor, multiple sites: Secondary | ICD-10-CM | POA: Diagnosis not present

## 2020-08-08 DIAGNOSIS — Z79899 Other long term (current) drug therapy: Secondary | ICD-10-CM | POA: Diagnosis not present

## 2020-08-08 DIAGNOSIS — M79643 Pain in unspecified hand: Secondary | ICD-10-CM | POA: Diagnosis not present

## 2020-08-08 DIAGNOSIS — E790 Hyperuricemia without signs of inflammatory arthritis and tophaceous disease: Secondary | ICD-10-CM | POA: Diagnosis not present

## 2020-08-08 DIAGNOSIS — R748 Abnormal levels of other serum enzymes: Secondary | ICD-10-CM | POA: Diagnosis not present

## 2020-08-08 DIAGNOSIS — N1831 Chronic kidney disease, stage 3a: Secondary | ICD-10-CM | POA: Diagnosis not present

## 2020-08-08 DIAGNOSIS — M810 Age-related osteoporosis without current pathological fracture: Secondary | ICD-10-CM | POA: Diagnosis not present

## 2020-08-08 DIAGNOSIS — M7989 Other specified soft tissue disorders: Secondary | ICD-10-CM | POA: Diagnosis not present

## 2020-08-08 DIAGNOSIS — Z7952 Long term (current) use of systemic steroids: Secondary | ICD-10-CM | POA: Diagnosis not present

## 2020-08-10 DIAGNOSIS — M1712 Unilateral primary osteoarthritis, left knee: Secondary | ICD-10-CM | POA: Diagnosis not present

## 2020-08-10 DIAGNOSIS — M7021 Olecranon bursitis, right elbow: Secondary | ICD-10-CM | POA: Diagnosis not present

## 2020-08-10 DIAGNOSIS — M79641 Pain in right hand: Secondary | ICD-10-CM | POA: Diagnosis not present

## 2020-08-10 DIAGNOSIS — M67441 Ganglion, right hand: Secondary | ICD-10-CM | POA: Diagnosis not present

## 2020-08-10 DIAGNOSIS — M79644 Pain in right finger(s): Secondary | ICD-10-CM | POA: Diagnosis not present

## 2020-08-11 ENCOUNTER — Other Ambulatory Visit: Payer: Self-pay | Admitting: Podiatry

## 2020-08-12 ENCOUNTER — Telehealth: Payer: Self-pay | Admitting: *Deleted

## 2020-08-12 NOTE — Telephone Encounter (Signed)
Patient is calling to request a refill of Tramadol, stated that she has been taking for the last few weeks, no reaction to medication.

## 2020-08-14 DIAGNOSIS — G4733 Obstructive sleep apnea (adult) (pediatric): Secondary | ICD-10-CM | POA: Diagnosis not present

## 2020-08-14 DIAGNOSIS — I5032 Chronic diastolic (congestive) heart failure: Secondary | ICD-10-CM | POA: Diagnosis not present

## 2020-08-14 DIAGNOSIS — R0602 Shortness of breath: Secondary | ICD-10-CM | POA: Diagnosis not present

## 2020-08-14 DIAGNOSIS — J471 Bronchiectasis with (acute) exacerbation: Secondary | ICD-10-CM | POA: Diagnosis not present

## 2020-08-14 DIAGNOSIS — G4736 Sleep related hypoventilation in conditions classified elsewhere: Secondary | ICD-10-CM | POA: Diagnosis not present

## 2020-08-14 DIAGNOSIS — J4540 Moderate persistent asthma, uncomplicated: Secondary | ICD-10-CM | POA: Diagnosis not present

## 2020-08-14 DIAGNOSIS — M0600 Rheumatoid arthritis without rheumatoid factor, unspecified site: Secondary | ICD-10-CM | POA: Diagnosis not present

## 2020-08-14 DIAGNOSIS — G894 Chronic pain syndrome: Secondary | ICD-10-CM | POA: Diagnosis not present

## 2020-08-14 DIAGNOSIS — J45909 Unspecified asthma, uncomplicated: Secondary | ICD-10-CM | POA: Diagnosis not present

## 2020-08-15 NOTE — Telephone Encounter (Signed)
Please advise 

## 2020-08-15 NOTE — Telephone Encounter (Signed)
Patient is calling for status of a refill of Tramadol. Please advise.

## 2020-08-16 MED ORDER — TRAMADOL HCL 50 MG PO TABS: 50.0000 mg | ORAL_TABLET | Freq: Three times a day (TID) | ORAL | 0 refills | Status: AC | PRN

## 2020-08-17 ENCOUNTER — Ambulatory Visit: Payer: Medicare PPO | Admitting: Podiatry

## 2020-08-17 ENCOUNTER — Other Ambulatory Visit: Payer: Self-pay

## 2020-08-17 ENCOUNTER — Encounter: Payer: Medicare PPO | Admitting: Podiatry

## 2020-08-17 DIAGNOSIS — Z9889 Other specified postprocedural states: Secondary | ICD-10-CM

## 2020-08-17 DIAGNOSIS — M86171 Other acute osteomyelitis, right ankle and foot: Secondary | ICD-10-CM

## 2020-08-17 DIAGNOSIS — L97512 Non-pressure chronic ulcer of other part of right foot with fat layer exposed: Secondary | ICD-10-CM

## 2020-08-17 NOTE — Telephone Encounter (Signed)
Returned call to patient, no answer, left vmessage that medication has been approved and sent to pharmacy.

## 2020-08-18 DIAGNOSIS — R1011 Right upper quadrant pain: Secondary | ICD-10-CM | POA: Diagnosis not present

## 2020-08-18 DIAGNOSIS — R748 Abnormal levels of other serum enzymes: Secondary | ICD-10-CM | POA: Diagnosis not present

## 2020-08-19 ENCOUNTER — Encounter: Payer: Self-pay | Admitting: Podiatry

## 2020-08-19 NOTE — Progress Notes (Signed)
Subjective:  Patient ID: Ebony Scott, female    DOB: 1951/01/11,  MRN: [id number]  Chief Complaint  Patient presents with   Routine Post Op    POST OP DOS 6.3.22    DOS: 07/22/2020 Procedure: Right second digit internal amputation  70 y.o. female returns for post-op check.  Patient states she is doing well.  She denies any other acute complaints she would like to discuss next treatment options.  She wants to make sure that is healing okay.  Review of Systems: Negative except as noted in the HPI. Denies N/V/F/Ch.  Past Medical History:  Diagnosis Date   Acute deep vein thrombosis (DVT) of right lower extremity (HCC) 12/13/2017   Allergic rhinitis    Allergy    SEASONAL   Anemia    Angio-edema    Anxiety    pt denies   Arthritis    Phreesia 10/21/2019   Asthma    Asthma    Phreesia 10/21/2019   Breast cancer ([place]) 1998   in remission   Cataract    REMOVED   Clostridium difficile colitis 01/14/2018   Clostridium difficile diarrhea 27/25/3664   Complication of anesthesia    "had hard time waking up from it several times" (02/20/2012)   Depression    "some; don't take anything for it" (02/20/2012)   Diverticulosis    DVT (deep venous thrombosis) (HCC)    Exertional dyspnea    Fibromyalgia 11/2011   GERD (gastroesophageal reflux disease)    Graves disease    Headache(784.0)    "related to allergies; more at different times during the year" (02/20/2012)   Hemorrhoids    Hiatal hernia    back and neck   Hx of adenomatous colonic polyps 04/12/2016   Hypercholesteremia    good cholesterol is high   Hypothyroidism    IBS (irritable bowel syndrome)    Moderate persistent asthma    -FeV1 72% 2011, -IgE 102 2011, CT sinus Neg 2011   Osteoporosis    on reclast yearly   Peripheral vascular disease ([place]) 2019   DVTs   Pneumonia 04/2011; ~ 11/2011   "double; single" (02/20/2012)   Recurrent upper respiratory infection (URI)    Seronegative rheumatoid arthritis ([place])    Dr.  Lahoma Rocker   SIRS (systemic inflammatory response syndrome) ([place]) 02/10/2018   Tracheobronchomalacia     Current Outpatient Medications:    acetaminophen (TYLENOL) 650 MG CR tablet, Take 1,300 mg by mouth every 8 (eight) hours as needed for pain., Disp: , Rfl:    albuterol (PROVENTIL) (2.5 MG/3ML) 0.083% nebulizer solution, USE 1 AMPULE IN NEBULIZER IN AM AT BEDTIME ALONG WITH FLUTTER VALVE, Disp: 180 mL, Rfl: 5   albuterol (VENTOLIN HFA) 108 (90 Base) MCG/ACT inhaler, INHALE 2 PUFFS BY MOUTH INTO THE LUNGS EVERY 4 HOURS AS NEEDED FOR WHEEZING OR SHORTNESS OF BREATH, Disp: 8.5 g, Rfl: 3   allopurinol (ZYLOPRIM) 100 MG tablet, Take 100 mg by mouth daily., Disp: , Rfl:    Alpha-D-Galactosidase (BEANO PO), Take 2-3 capsules by mouth 3 (three) times daily with meals., Disp: , Rfl:    Alpha-Lipoic Acid 600 MG CAPS, Take 600 mg by mouth daily. , Disp: , Rfl:    ammonium lactate (AMLACTIN) 12 % cream, Apply topically as needed for dry skin. (Patient taking differently: Apply 1 g topically at bedtime.), Disp: 385 g, Rfl: 2   atorvastatin (LIPITOR) 10 MG tablet, TAKE 1 TABLET BY MOUTH DAILY, Disp: 90 tablet, Rfl: 3   azelastine (  ASTELIN) 0.1 % nasal spray, USE 2 SPRAYS IN EACH NOSTRIL DAILY AS DIRECTED (Patient taking differently: Place 2 sprays into both nostrils daily as needed for allergies.), Disp: 30 mL, Rfl: 5   benzonatate (TESSALON) 200 MG capsule, TAKE 1 CAPSULE BY MOUTH TWICE DAILY AS NEEDED FOR COUGH, Disp: 20 capsule, Rfl: 1   Calcium Citrate (CITRACAL PO), Take 1 tablet by mouth in the morning and at bedtime., Disp: , Rfl:    CALCIUM-MAGNESIUM PO, Take 1 tablet by mouth 2 (two) times daily., Disp: , Rfl:    cetirizine (ZYRTEC) 10 MG tablet, Take 10 mg by mouth daily., Disp: , Rfl:    cholecalciferol (VITAMIN D) 25 MCG (1000 UT) tablet, Take 1,000 Units by mouth daily. , Disp: , Rfl:    COVID-19 mRNA vaccine, Pfizer, 30 MCG/0.3ML injection, INJECT AS DIRECTED, Disp: .3 mL, Rfl: 0    cyproheptadine (PERIACTIN) 4 MG tablet, TAKE 2 TABLETS BY MOUTH EVERY EVENING (Patient taking differently: Take 8 mg by mouth every evening.), Disp: 180 tablet, Rfl: 2   denosumab (PROLIA) 60 MG/ML SOLN injection, Inject 60 mg into the skin every 6 (six) months. Administer in upper arm, thigh, or abdomen, Disp: , Rfl:    DEXILANT 60 MG capsule, TAKE 1 CAPSULE BY MOUTH EACH MORNING (Patient taking differently: Take 60 mg by mouth daily.), Disp: 90 capsule, Rfl: 3   diclofenac Sodium (VOLTAREN) 1 % GEL, Apply 2 g topically 4 (four) times daily as needed (joint pain)., Disp: 100 g, Rfl: 6   doxycycline (VIBRA-TABS) 100 MG tablet, Take 1 tablet (100 mg total) by mouth 2 (two) times daily., Disp: 28 tablet, Rfl: 0   EPINEPHrine (EPIPEN 2-PAK) 0.3 mg/0.3 mL IJ SOAJ injection, Inject 0.3 mLs (0.3 mg total) into the muscle Once PRN., Disp: 0.3 mL, Rfl: 2   famotidine (PEPCID) 20 MG tablet, TAKE 1 TABLET BY MOUTH TWICE A DAY (Patient taking differently: Take 20 mg by mouth 2 (two) times daily.), Disp: 60 tablet, Rfl: 5   fluticasone (FLONASE) 50 MCG/ACT nasal spray, USE 2 SPRAYS IN EACH NOSTRIL DAILY, Disp: 16 g, Rfl: 2   folic acid (FOLVITE) 1 MG tablet, Take 1 mg by mouth daily., Disp: , Rfl:    furosemide (LASIX) 20 MG tablet, Take 1 tablet (20 mg total) by mouth every evening. Take 40 mg in the AM, and take additional 20 mg in the PM for swelling (Patient taking differently: Take 20 mg by mouth 2 (two) times daily. Take 40 mg in the AM, and take additional 20 mg in the PM for swelling), Disp: 30 tablet, Rfl: 3   furosemide (LASIX) 40 MG tablet, Take 1 tablet (40 mg total) by mouth daily as needed., Disp: 30 tablet, Rfl: 3   gabapentin (NEURONTIN) 600 MG tablet, TAKE 1 TABLET BY MOUTH DAILY (Patient taking differently: Take 600 mg by mouth at bedtime.), Disp: 90 tablet, Rfl: 3   guaiFENesin (MUCINEX) 600 MG 12 hr tablet, Take 1 tablet (600 mg total) by mouth 2 (two) times daily as needed for cough or to loosen  phlegm., Disp: 60 tablet, Rfl: 1   hydrocortisone 2.5 % cream, Apply topically 2 (two) times daily as needed (hemorrhoids)., Disp: 28 g, Rfl: 3   Lactase 9000 units TABS, Take 18,000-27,000 Units by mouth 4 (four) times daily as needed (dairy consumption)., Disp: , Rfl:    lidocaine (LIDODERM) 5 %, Place 1 patch onto the skin daily as needed (pain). Remove & Discard patch within 12 hours or  as directed by MD, Disp: , Rfl:    lidocaine (LIDODERM) 5 %, APPLY 1 PATCH TOPICALLY AS DIRECTED, Disp: 30 patch, Rfl: 0   magnesium oxide (MAG-OX) 400 MG tablet, Take 400 mg by mouth daily., Disp: , Rfl:    mirtazapine (REMERON SOL-TAB) 30 MG disintegrating tablet, DISSOLVE 1 TABLET BY MOUTH EVERY NIGHT AT BEDTIME, Disp: 90 tablet, Rfl: 1   MITIGARE 0.6 MG CAPS, Take 0.6 mg by mouth every other day., Disp: , Rfl:    montelukast (SINGULAIR) 10 MG tablet, TAKE 1 TABLET BY MOUTH DAILY (Patient taking differently: Take 10 mg by mouth every evening.), Disp: 30 tablet, Rfl: 5   mupirocin ointment (BACTROBAN) 2 %, APPLY TO BED SORES AS NEEDED, Disp: 22 g, Rfl: 0   Nystatin (GERHARDT'S BUTT CREAM) CREA, Apply 1 application topically daily as needed (hemorrhoids). NYSTATIN 100,000 UNIT/GM CR 100000 CRM- 60 g, ZINC OXIDE 20 % OINT 20 OINT- 60 g, HYDROCORTISONE 1 % CREAM- 60 g, Disp: 1 each, Rfl: 11   Nystatin (GERHARDT'S BUTT CREAM) CREA, Apply to the affected areas daily as needed for hemorrhoids, Disp: 1 each, Rfl: 11   nystatin (MYCOSTATIN) 100000 UNIT/ML suspension, SWISH AND SPIT 5 MLS TWICE DAILY (Patient taking differently: Use as directed 5 mLs in the mouth or throat at bedtime.), Disp: 473 mL, Rfl: 3   nystatin cream (MYCOSTATIN), Apply 1 application topically 2 (two) times daily., Disp: , Rfl:    nystatin-triamcinolone ointment (MYCOLOG), Apply topically 2 (two) times daily. (Patient taking differently: Apply 1 application topically 2 (two) times daily as needed (yeast infections).), Disp: 90 g, Rfl: 1    ondansetron (ZOFRAN) 4 MG tablet, Take 1 tablet (4 mg total) by mouth every 8 (eight) hours as needed for nausea or vomiting., Disp: 10 tablet, Rfl: 0   predniSONE (DELTASONE) 5 MG tablet, Take 15 mg by mouth daily with breakfast., Disp: , Rfl:    Prenatal Vit-Fe Fumarate-FA (PRENATAL MULTIVITAMIN) TABS tablet, Take 1 tablet by mouth daily., Disp: , Rfl:    Probiotic Product (ALIGN PO), Take 1 capsule by mouth daily., Disp: , Rfl:    Propylene Glycol (SYSTANE COMPLETE) 0.6 % SOLN, Place 1 drop into both eyes 2 (two) times daily as needed (dry eyes)., Disp: , Rfl:    Respiratory Therapy Supplies (FLUTTER) DEVI, Use as directed, Disp: 1 each, Rfl: 0   Salicylic Acid-Urea (KERASAL EX), Apply 1 application topically at bedtime., Disp: , Rfl:    sodium chloride HYPERTONIC 3 % nebulizer solution, Take by nebulization 2 (two) times daily., Disp: 750 mL, Rfl: 12   Spacer/Aero-Holding Chambers (AEROCHAMBER PLUS) inhaler, Use as instructed, Disp: 1 each, Rfl: 0   Tiotropium Bromide-Olodaterol (STIOLTO RESPIMAT) 2.5-2.5 MCG/ACT AERS, Inhale 2 puffs into the lungs daily., Disp: 4 g, Rfl: 5   traMADol (ULTRAM) 50 MG tablet, Take 1 tablet (50 mg total) by mouth every 8 (eight) hours as needed for up to 5 days., Disp: 15 tablet, Rfl: 0   XARELTO 20 MG TABS tablet, TAKE 1 TABLET BY MOUTH DAILY WITH SUPPER (Patient taking differently: Take 20 mg by mouth every evening.), Disp: 90 tablet, Rfl: 3  Current Facility-Administered Medications:    denosumab (PROLIA) injection 60 mg, 60 mg, Subcutaneous, Q6 months, [name], Warren T, MD, 60 mg at 03/31/20 1224  Social History   Tobacco Use  Smoking Status Never   Passive exposure: Yes  Smokeless Tobacco Never  Tobacco Comments   Parents    Allergies  Allergen Reactions   Dust  Mite Extract Shortness Of Breath and Other (See Comments)    "sneezing" (02/20/2012)   Molds & Smuts Shortness Of Breath   Morphine And Related Hives and Itching   Other Shortness Of  Breath and Other (See Comments)    Grass and weeds "sneezing; filled sinuses" (02/20/2012)   Penicillins Rash and Other (See Comments)    "welts" (02/20/2012) Has patient had a PCN reaction causing immediate rash, facial/tongue/throat swelling, SOB or lightheadedness with hypotension: Unknown Has patient had a PCN reaction causing severe rash involving mucus membranes or skin necrosis: No Has patient had a PCN reaction that required hospitalization: No Has patient had a PCN reaction occurring within the last 10 years: No If all of the above answers are "NO", then may proceed with Cephalosporin use. Tolerated Ancef 02/16/20.    Reclast [Zoledronic Acid] Other (See Comments)    Fever, Put in hospital, dr said it was a reaction from a reaction    Rofecoxib Swelling    Vioxx REACTION: feet swelling   Shellfish Allergy Anaphylaxis, Shortness Of Breath, Itching and Rash   Tetracycline Hcl Nausea And Vomiting   Xolair [Omalizumab] Other (See Comments)    Caused Blood clot   Dilaudid [Hydromorphone Hcl] Itching   Hydrocodone-Acetaminophen Nausea And Vomiting   Levofloxacin Other (See Comments)    GI upset   Oxycodone Hcl Nausea And Vomiting   Paroxetine Nausea And Vomiting    Paxil    Baclofen Other (See Comments)    Altered mental status requiring 3 day hospital stay    Celecoxib Swelling    Feet swelling   Diltiazem Swelling   Lactose Intolerance (Gi) Other (See Comments)    Bloating and gas   Tree Extract Other (See Comments)    "tested and told I was allergic to it; never experienced a reaction to it" (02/20/2012)   Objective:  There were no vitals filed for this visit. There is no height or weight on file to calculate BMI. Constitutional Well developed. Well nourished.  Vascular Foot warm and well perfused. Capillary refill normal to all digits.   Neurologic Normal speech. Oriented to person, place, and time. Epicritic sensation to light touch grossly present bilaterally.   Dermatologic Superficial dehiscence noted.  Does not probe down to deep bone.  Orthopedic: Mild tenderness to palpation noted about the surgical site.   Radiographs: 3 views of skeletally mature adult right foot: Arthroplasty noted at the head of the second metatarsal phalangeal joint. Assessment:   1. Post-operative state   2. Toe ulcer, right, with fat layer exposed ([place])   3. Acute osteomyelitis of toe, right ([place])     Plan:  Patient was evaluated and treated and all questions answered.  S/p foot surgery right -Progressing as expected post-operatively. -XR: See above -WB Status: Weightbearing as tolerated in surgical shoe -Sutures: Removed.  Mild dehiscence noted.  No complication noted. -Medications: None -Continue Betadine wet-to-dry dressing changes.  No follow-ups on file.

## 2020-08-23 ENCOUNTER — Ambulatory Visit (INDEPENDENT_AMBULATORY_CARE_PROVIDER_SITE_OTHER)
Admission: RE | Admit: 2020-08-23 | Discharge: 2020-08-23 | Disposition: A | Discharge status: Home / Self Care | Payer: Medicare PPO | Source: Ambulatory Visit | Attending: Pulmonary Disease | Admitting: Pulmonary Disease

## 2020-08-23 ENCOUNTER — Other Ambulatory Visit: Payer: Self-pay

## 2020-08-23 DIAGNOSIS — R0602 Shortness of breath: Secondary | ICD-10-CM | POA: Diagnosis not present

## 2020-08-23 DIAGNOSIS — R918 Other nonspecific abnormal finding of lung field: Secondary | ICD-10-CM

## 2020-08-23 DIAGNOSIS — R911 Solitary pulmonary nodule: Secondary | ICD-10-CM | POA: Diagnosis not present

## 2020-08-23 DIAGNOSIS — J439 Emphysema, unspecified: Secondary | ICD-10-CM | POA: Diagnosis not present

## 2020-08-23 DIAGNOSIS — I700 Atherosclerosis of aorta: Secondary | ICD-10-CM | POA: Diagnosis not present

## 2020-08-24 ENCOUNTER — Telehealth: Payer: Self-pay | Admitting: Pulmonary Disease

## 2020-08-24 DIAGNOSIS — M7021 Olecranon bursitis, right elbow: Secondary | ICD-10-CM | POA: Diagnosis not present

## 2020-08-24 DIAGNOSIS — M67441 Ganglion, right hand: Secondary | ICD-10-CM | POA: Diagnosis not present

## 2020-08-24 DIAGNOSIS — M25512 Pain in left shoulder: Secondary | ICD-10-CM | POA: Diagnosis not present

## 2020-08-24 DIAGNOSIS — M1712 Unilateral primary osteoarthritis, left knee: Secondary | ICD-10-CM | POA: Diagnosis not present

## 2020-08-24 DIAGNOSIS — R748 Abnormal levels of other serum enzymes: Secondary | ICD-10-CM | POA: Diagnosis not present

## 2020-08-24 MED ORDER — AZITHROMYCIN 250 MG PO TABS: ORAL_TABLET | ORAL | 0 refills | Status: DC

## 2020-08-24 MED ORDER — PREDNISONE 20 MG PO TABS: 40.0000 mg | ORAL_TABLET | Freq: Every day | ORAL | 0 refills | Status: AC

## 2020-08-24 NOTE — Telephone Encounter (Signed)
Called and spoke with patient. She stated that she has noticed an increase in her SOB over the last 5 days as well as increased fatigue. She has a productive cough with clear phlegm, increased wheezing. She can not walk across her living room without gasp for air. She is still using 2L of O2 at night. She denied any fevers or body aches. She is still using her Stiolto, albuterol HFA and nebs.   She had a CT scan completed yesterday and would like the results as well.   Pharmacy is [facility].   JD, can you please advise? Thanks!

## 2020-08-24 NOTE — Telephone Encounter (Signed)
Called and spoke with patient. She verbalized understanding of results and recommendations. Will go ahead and send in the prednisone and zpak.   Nothing further needed at time of call.

## 2020-08-25 ENCOUNTER — Ambulatory Visit: Payer: Medicare PPO | Admitting: Family Medicine

## 2020-08-25 ENCOUNTER — Encounter: Payer: Self-pay | Admitting: Family Medicine

## 2020-08-25 ENCOUNTER — Other Ambulatory Visit: Payer: Self-pay

## 2020-08-25 VITALS — BP 132/84 | HR 90 | Temp 97.9°F | Resp 18

## 2020-08-25 DIAGNOSIS — R768 Other specified abnormal immunological findings in serum: Secondary | ICD-10-CM | POA: Diagnosis not present

## 2020-08-25 DIAGNOSIS — R601 Generalized edema: Secondary | ICD-10-CM

## 2020-08-25 MED ORDER — METOLAZONE 5 MG PO TABS: 5.0000 mg | ORAL_TABLET | Freq: Every day | ORAL | 0 refills | Status: DC

## 2020-08-25 NOTE — Progress Notes (Signed)
Subjective:    Patient ID: Ebony Scott, female    DOB: 29-Sep-1950, 70 y.o.   MRN: [id number]  HPI Patient presents today with dyspnea.  Pulmonologist recently started her on prednisone and Z-Pak.  She has not picked up the Z-Pak yet but she is taking 40 mg of prednisone.  She reports shortness of breath with activity.  She also has swelling all over her body.  She has +2 pitting edema in both legs distal to the thigh.  She has swelling in her face and what appears to be bibasilar crackles in her lungs.  I believe she is fluid overloaded and I am concerned about diastolic dysfunction as a potential cause.  Also reviewed her pulmonologist last note and due to a positive p-ANCA, they had recommended checking an MPO and PR-3 panel at her next visit to rule out vasculitis as a potential cause of her opacities in the lung and shortness of breath.  She denies any rash.  She denies any hemoptysis.  She is yet to start the rituximab  CT Chest 08/23/20 IMPRESSION: 1. New trace right pleural fluid and left pleural fluid or thickening. Concurrent new mild pulmonary interlobular septal thickening. Constellation of findings suggests mild pulmonary edema/fluid overload. 2. No other explanation for patient's acute symptoms. 3. Decrease and resolution of pulmonary nodules, currently with morphology favoring scarring. 4. Coronary artery atherosclerosis. Aortic Atherosclerosis (ICD10-I70.0). 5. Aortic valvular calcifications. Consider echocardiography to evaluate for valvular dysfunction.   Past Medical History:  Diagnosis Date   Acute deep vein thrombosis (DVT) of right lower extremity (HCC) 12/13/2017   Allergic rhinitis    Allergy    SEASONAL   Anemia    Angio-edema    Anxiety    pt denies   Arthritis    Phreesia 10/21/2019   Asthma    Asthma    [employer]reesia 10/21/2019   Breast cancer ([place]) 1998   in remission   Cataract    REMOVED   Clostridium difficile colitis 01/14/2018   Clostridium  difficile diarrhea 01/77/9390   Complication of anesthesia    "had hard time waking up from it several times" (02/20/2012)   Depression    "some; don't take anything for it" (02/20/2012)   Diverticulosis    DVT (deep venous thrombosis) (HCC)    Exertional dyspnea    Fibromyalgia 11/2011   GERD (gastroesophageal reflux disease)    Graves disease    Headache(784.0)    "related to allergies; more at different times during the year" (02/20/2012)   Hemorrhoids    Hiatal hernia    back and neck   Hx of adenomatous colonic polyps 04/12/2016   Hypercholesteremia    good cholesterol is high   Hypothyroidism    IBS (irritable bowel syndrome)    Moderate persistent asthma    -FeV1 72% 2011, -IgE 102 2011, CT sinus Neg 2011   Osteoporosis    on reclast yearly   Peripheral vascular disease ([place]) 2019   DVTs   Pneumonia 04/2011; ~ 11/2011   "double; single" (02/20/2012)   Recurrent upper respiratory infection (URI)    Seronegative rheumatoid arthritis ([place])    Dr. Lahoma Rocker   SIRS (systemic inflammatory response syndrome) ([place]) 02/10/2018   Tracheobronchomalacia    Past Surgical History:  Procedure Laterality Date   ABDOMINAL HYSTERECTOMY N/A    Phreesia 10/21/2019   ANTERIOR AND POSTERIOR REPAIR  1990's   APPENDECTOMY     BREAST LUMPECTOMY  1998   left   BREAST  SURGERY N/A    Phreesia 10/21/2019   BRONCHIAL WASHINGS  04/05/2020   Procedure: BRONCHIAL WASHINGS;  Surgeon: Freddi Starr, MD;  Location: [facility];  Service: Pulmonary;;   CARPOMETACARPEL ([place]) FUSION OF THUMB WITH AUTOGRAFT FROM RADIUS  ~ 2009   "both thumbs" (02/20/2012)   CATARACT EXTRACTION W/ INTRAOCULAR LENS  IMPLANT, BILATERAL  2012   CERVICAL DISCECTOMY  10/2001   C5-C6   CERVICAL FUSION  2003   C3-C4   CHOLECYSTECTOMY     COLONOSCOPY     DEBRIDEMENT TENNIS ELBOW  ?1970's   right   ESOPHAGOGASTRODUODENOSCOPY     EYE SURGERY N/A    [employer]reesia 10/21/2019   HYSTERECTOMY     KNEE ARTHROPLASTY  ?1990's    "?right; w/cartilage repair" (02/20/2012)   NASAL SEPTUM SURGERY  1980's   POSTERIOR CERVICAL FUSION/FORAMINOTOMY  2004   "failed initial fusion; rewired  anterior neck" (02/20/2012)   REVERSE SHOULDER ARTHROPLASTY Right 02/16/2020   Procedure: REVERSE SHOULDER ARTHROPLASTY;  Surgeon: Marchia Bond, MD;  Location: [facility];  Service: Orthopedics;  Laterality: Right;   SPINE SURGERY N/A    [employer]reesia 10/21/2019   TONSILLECTOMY  ~ 1953   VESICOVAGINAL FISTULA CLOSURE W/ TAH  1988   VIDEO BRONCHOSCOPY Bilateral 08/23/2016   Procedure: VIDEO BRONCHOSCOPY WITH FLUORO;  Surgeon: Javier Glazier, MD;  Location: [facility];  Service: Cardiopulmonary;  Laterality: Bilateral;   VIDEO BRONCHOSCOPY N/A 04/05/2020   Procedure: VIDEO BRONCHOSCOPY WITHOUT FLUORO;  Surgeon: Freddi Starr, MD;  Location: [facility];  Service: Pulmonary;  Laterality: N/A;   Current Outpatient Medications on File Prior to Visit  Medication Sig Dispense Refill   acetaminophen (TYLENOL) 650 MG CR tablet Take 1,300 mg by mouth every 8 (eight) hours as needed for pain.     albuterol (PROVENTIL) (2.5 MG/3ML) 0.083% nebulizer solution USE 1 AMPULE IN NEBULIZER IN AM AT BEDTIME ALONG WITH FLUTTER VALVE 180 mL 5   albuterol (VENTOLIN HFA) 108 (90 Base) MCG/ACT inhaler INHALE 2 PUFFS BY MOUTH INTO THE LUNGS EVERY 4 HOURS AS NEEDED FOR WHEEZING OR SHORTNESS OF BREATH 8.5 g 3   allopurinol (ZYLOPRIM) 100 MG tablet Take 100 mg by mouth daily.     Alpha-D-Galactosidase (BEANO PO) Take 2-3 capsules by mouth 3 (three) times daily with meals.     Alpha-Lipoic Acid 600 MG CAPS Take 600 mg by mouth daily.      ammonium lactate (AMLACTIN) 12 % cream Apply topically as needed for dry skin. (Patient taking differently: Apply 1 g topically at bedtime.) 385 g 2   atorvastatin (LIPITOR) 10 MG tablet TAKE 1 TABLET BY MOUTH DAILY 90 tablet 3   azelastine (ASTELIN) 0.1 % nasal spray USE 2 SPRAYS IN EACH NOSTRIL DAILY AS DIRECTED (Patient taking  differently: Place 2 sprays into both nostrils daily as needed for allergies.) 30 mL 5   azithromycin (ZITHROMAX) 250 MG tablet Take 2 tablets on first day, the 1 tablet once daily until finished. 6 tablet 0   benzonatate (TESSALON) 200 MG capsule TAKE 1 CAPSULE BY MOUTH TWICE DAILY AS NEEDED FOR COUGH 20 capsule 1   Calcium Citrate (CITRACAL PO) Take 1 tablet by mouth in the morning and at bedtime.     CALCIUM-MAGNESIUM PO Take 1 tablet by mouth 2 (two) times daily.     cetirizine (ZYRTEC) 10 MG tablet Take 10 mg by mouth daily.     cholecalciferol (VITAMIN D) 25 MCG (1000 UT) tablet Take 1,000 Units by mouth daily.  COVID-19 mRNA vaccine, Pfizer, 30 MCG/0.3ML injection INJECT AS DIRECTED .3 mL 0   cyproheptadine (PERIACTIN) 4 MG tablet TAKE 2 TABLETS BY MOUTH EVERY EVENING (Patient taking differently: Take 8 mg by mouth every evening.) 180 tablet 2   denosumab (PROLIA) 60 MG/ML SOLN injection Inject 60 mg into the skin every 6 (six) months. Administer in upper arm, thigh, or abdomen     DEXILANT 60 MG capsule TAKE 1 CAPSULE BY MOUTH EACH MORNING (Patient taking differently: Take 60 mg by mouth daily.) 90 capsule 3   diclofenac Sodium (VOLTAREN) 1 % GEL Apply 2 g topically 4 (four) times daily as needed (joint pain). 100 g 6   EPINEPHrine (EPIPEN 2-PAK) 0.3 mg/0.3 mL IJ SOAJ injection Inject 0.3 mLs (0.3 mg total) into the muscle Once PRN. 0.3 mL 2   famotidine (PEPCID) 20 MG tablet TAKE 1 TABLET BY MOUTH TWICE A DAY (Patient taking differently: Take 20 mg by mouth 2 (two) times daily.) 60 tablet 5   fluticasone (FLONASE) 50 MCG/ACT nasal spray USE 2 SPRAYS IN EACH NOSTRIL DAILY 16 g 2   folic acid (FOLVITE) 1 MG tablet Take 1 mg by mouth daily.     furosemide (LASIX) 20 MG tablet Take 1 tablet (20 mg total) by mouth every evening. Take 40 mg in the AM, and take additional 20 mg in the PM for swelling (Patient taking differently: Take 20 mg by mouth 2 (two) times daily. Take 40 mg in the AM, and  take additional 20 mg in the PM for swelling) 30 tablet 3   furosemide (LASIX) 40 MG tablet Take 1 tablet (40 mg total) by mouth daily as needed. 30 tablet 3   gabapentin (NEURONTIN) 600 MG tablet TAKE 1 TABLET BY MOUTH DAILY (Patient taking differently: Take 600 mg by mouth at bedtime.) 90 tablet 3   guaiFENesin (MUCINEX) 600 MG 12 hr tablet Take 1 tablet (600 mg total) by mouth 2 (two) times daily as needed for cough or to loosen phlegm. 60 tablet 1   hydrocortisone 2.5 % cream Apply topically 2 (two) times daily as needed (hemorrhoids). 28 g 3   Lactase 9000 units TABS Take 18,000-27,000 Units by mouth 4 (four) times daily as needed (dairy consumption).     lidocaine (LIDODERM) 5 % Place 1 patch onto the skin daily as needed (pain). Remove & Discard patch within 12 hours or as directed by MD     lidocaine (LIDODERM) 5 % APPLY 1 PATCH TOPICALLY AS DIRECTED 30 patch 0   magnesium oxide (MAG-OX) 400 MG tablet Take 400 mg by mouth daily.     mirtazapine (REMERON SOL-TAB) 30 MG disintegrating tablet DISSOLVE 1 TABLET BY MOUTH EVERY NIGHT AT BEDTIME 90 tablet 1   MITIGARE 0.6 MG CAPS Take 0.6 mg by mouth every other day.     montelukast (SINGULAIR) 10 MG tablet TAKE 1 TABLET BY MOUTH DAILY (Patient taking differently: Take 10 mg by mouth every evening.) 30 tablet 5   mupirocin ointment (BACTROBAN) 2 % APPLY TO BED SORES AS NEEDED 22 g 0   Nystatin (GERHARDT'S BUTT CREAM) CREA Apply 1 application topically daily as needed (hemorrhoids). NYSTATIN 100,000 UNIT/GM CR 100000 CRM- 60 g, ZINC OXIDE 20 % OINT 20 OINT- 60 g, HYDROCORTISONE 1 % CREAM- 60 g 1 each 11   Nystatin (GERHARDT'S BUTT CREAM) CREA Apply to the affected areas daily as needed for hemorrhoids 1 each 11   nystatin (MYCOSTATIN) 100000 UNIT/ML suspension SWISH AND SPIT 5 MLS  TWICE DAILY (Patient taking differently: Use as directed 5 mLs in the mouth or throat at bedtime.) 473 mL 3   nystatin cream (MYCOSTATIN) Apply 1 application topically 2  (two) times daily.     nystatin-triamcinolone ointment (MYCOLOG) Apply topically 2 (two) times daily. (Patient taking differently: Apply 1 application topically 2 (two) times daily as needed (yeast infections).) 90 g 1   ondansetron (ZOFRAN) 4 MG tablet Take 1 tablet (4 mg total) by mouth every 8 (eight) hours as needed for nausea or vomiting. 10 tablet 0   predniSONE (DELTASONE) 20 MG tablet Take 2 tablets (40 mg total) by mouth daily with breakfast for 5 days. 10 tablet 0   predniSONE (DELTASONE) 5 MG tablet Take 15 mg by mouth daily with breakfast.     Prenatal Vit-Fe Fumarate-FA (PRENATAL MULTIVITAMIN) TABS tablet Take 1 tablet by mouth daily.     Probiotic Product (ALIGN PO) Take 1 capsule by mouth daily.     Propylene Glycol (SYSTANE COMPLETE) 0.6 % SOLN Place 1 drop into both eyes 2 (two) times daily as needed (dry eyes).     Respiratory Therapy Supplies (FLUTTER) DEVI Use as directed 1 each 0   Salicylic Acid-Urea (KERASAL EX) Apply 1 application topically at bedtime.     sodium chloride HYPERTONIC 3 % nebulizer solution Take by nebulization 2 (two) times daily. 750 mL 12   Spacer/Aero-Holding Chambers (AEROCHAMBER PLUS) inhaler Use as instructed 1 each 0   Tiotropium Bromide-Olodaterol (STIOLTO RESPIMAT) 2.5-2.5 MCG/ACT AERS Inhale 2 puffs into the lungs daily. 4 g 5   XARELTO 20 MG TABS tablet TAKE 1 TABLET BY MOUTH DAILY WITH SUPPER (Patient taking differently: Take 20 mg by mouth every evening.) 90 tablet 3   Current Facility-Administered Medications on File Prior to Visit  Medication Dose Route Frequency Provider Last Rate Last Admin   denosumab (PROLIA) injection 60 mg  60 mg Subcutaneous Q6 months Marchia Diguglielmo T, MD   60 mg at 03/31/20 1224   Allergies  Allergen Reactions   Dust Mite Extract Shortness Of Breath and Other (See Comments)    "sneezing" (02/20/2012)   Molds & Smuts Shortness Of Breath   Morphine And Related Hives and Itching   Other Shortness Of Breath and Other  (See Comments)    Grass and weeds "sneezing; filled sinuses" (02/20/2012)   Penicillins Rash and Other (See Comments)    "welts" (02/20/2012) Has patient had a PCN reaction causing immediate rash, facial/tongue/throat swelling, SOB or lightheadedness with hypotension: Unknown Has patient had a PCN reaction causing severe rash involving mucus membranes or skin necrosis: No Has patient had a PCN reaction that required hospitalization: No Has patient had a PCN reaction occurring within the last 10 years: No If all of the above answers are "NO", then may proceed with Cephalosporin use. Tolerated Ancef 02/16/20.    Reclast [Zoledronic Acid] Other (See Comments)    Fever, Put in hospital, dr said it was a reaction from a reaction    Rofecoxib Swelling    Vioxx REACTION: feet swelling   Shellfish Allergy Anaphylaxis, Shortness Of Breath, Itching and Rash   Tetracycline Hcl Nausea And Vomiting   Xolair [Omalizumab] Other (See Comments)    Caused Blood clot   Dilaudid [Hydromorphone Hcl] Itching   Hydrocodone-Acetaminophen Nausea And Vomiting   Levofloxacin Other (See Comments)    GI upset   Oxycodone Hcl Nausea And Vomiting   Paroxetine Nausea And Vomiting    Paxil    Baclofen Other (See  Comments)    Altered mental status requiring 3 day hospital stay    Celecoxib Swelling    Feet swelling   Diltiazem Swelling   Lactose Intolerance (Gi) Other (See Comments)    Bloating and gas   Tree Extract Other (See Comments)    "tested and told I was allergic to it; never experienced a reaction to it" (02/20/2012)   Social History   Socioeconomic History   Marital status: Divorced    Spouse name: Not on file   Number of children: 2   Years of education: college   Highest education level: Not on file  Occupational History   Occupation: Disabled    Comment: Retired Engineer, production: RETIRED  Tobacco Use   Smoking status: Never    Passive exposure: Yes   Smokeless  tobacco: Never   Tobacco comments:    Parents  Vaping Use   Vaping Use: Never used  Substance and Sexual Activity   Alcohol use: Not Currently    Alcohol/week: 0.0 standard drinks   Drug use: No   Sexual activity: Never  Other Topics Concern   Not on file  Social History Narrative   Patient lives at home alone. Patient  divorced.    Patient has her BS degree.   Right handed.   Caffeine- sometimes coffee.      [facility]:   Born in [place], [place]. She worked as a Copywriter, advertising. She has no pets currently. She does have indoor plants. Previously had mold in her home that was remediated. Carpet was removed.          Social Determinants of Health   Financial Resource Strain: Low Risk    Difficulty of Paying Living Expenses: Not hard at all  Food Insecurity: No Food Insecurity   Worried About Charity fundraiser in the Last Year: Never true   [place] in the Last Year: Never true  Transportation Needs: No Transportation Needs   Lack of Transportation (Medical): No   Lack of Transportation (Non-Medical): No  Physical Activity: Inactive   Days of Exercise per Week: 0 days   Minutes of Exercise per Session: 0 min  Stress: No Stress Concern Present   Feeling of Stress : Not at all  Social Connections: Moderately Isolated   Frequency of Communication with Friends and Family: More than three times a week   Frequency of Social Gatherings with Friends and Family: More than three times a week   Attends Religious Services: Never   Marine scientist or Organizations: Yes   Attends Archivist Meetings: Never   Marital Status: Divorced  Human resources officer Violence: Not At Risk   Fear of Current or Ex-Partner: No   Emotionally Abused: No   Physically Abused: No   Sexually Abused: No     Review of Systems     Objective:   Physical Exam Vitals reviewed.  Constitutional:      General: She is not in acute distress.    Appearance: Normal appearance.  She is not ill-appearing or toxic-appearing.  HENT:     Right Ear: Tympanic membrane and ear canal normal.     Left Ear: Tympanic membrane and ear canal normal.     Nose: No congestion or rhinorrhea.  Cardiovascular:     Rate and Rhythm: Normal rate and regular rhythm.     Heart sounds: Normal heart sounds.  Pulmonary:     Effort: Pulmonary effort  is normal. No respiratory distress.     Breath sounds: No stridor or decreased air movement. Rales present. No wheezing.  Abdominal:     General: Bowel sounds are normal. There is no distension.     Palpations: Abdomen is soft.     Tenderness: There is no abdominal tenderness. There is no guarding.  Musculoskeletal:     Right lower leg: Edema present.     Left lower leg: Edema present.  Skin:    Findings: No erythema.  Neurological:     Mental Status: She is alert and oriented to person, place, and time.     Cranial Nerves: No cranial nerve deficit.     Sensory: No sensory deficit.     Motor: No weakness.     Coordination: Coordination normal.  Moon facies, buffalo hump       Assessment & Plan:  Antineutrophil cytoplasmic antibody (ANCA) positive - Plan: Mpo/pr-3 (anca) antibodies  Generalized edema - Plan: COMPLETE METABOLIC PANEL WITH GFR, Brain natriuretic peptide I am concerned about acute congestive heart failure secondary to diastolic dysfunction.  I will check a BNP to confirm this.  If elevated, I would repeat an echocardiogram.  Meanwhile I will give the patient Zaroxolyn 5 mg.  I want her to take this first thing in the morning and then 30 minutes later I want her to take 80 mg of Lasix.  Obtain a CMP today to obtain baseline renal function and potassium.  Recheck Monday of next week.  After tomorrow resume her previous dose of diuretic.  This will be a one-time dose of Zaroxolyn.  Also obtain MPO and PR-3 antibodies to evaluate for possible pulmonary vasculitis

## 2020-08-26 LAB — BRAIN NATRIURETIC PEPTIDE: Brain Natriuretic Peptide: 494 pg/mL — ABNORMAL HIGH (ref <100)

## 2020-08-26 LAB — COMPLETE METABOLIC PANEL WITH GFR
AG Ratio: 1.7 (calc) (ref 1.0–2.5)
ALT: 105 U/L — ABNORMAL HIGH (ref 6–29)
AST: 70 U/L — ABNORMAL HIGH (ref 10–35)
Albumin: 3.8 g/dL (ref 3.6–5.1)
Alkaline phosphatase (APISO): 74 U/L (ref 37–153)
BUN/Creatinine Ratio: 26 (calc) — ABNORMAL HIGH (ref 6–22)
BUN: 39 mg/dL — ABNORMAL HIGH (ref 7–25)
CO2: 27 mmol/L (ref 20–32)
Calcium: 9.2 mg/dL (ref 8.6–10.4)
Chloride: 100 mmol/L (ref 98–110)
Creat: 1.5 mg/dL — ABNORMAL HIGH (ref 0.60–0.93)
GFR, Est African American: 40 mL/min/{1.73_m2} — ABNORMAL LOW (ref >=60)
GFR, Est Non African American: 34 mL/min/{1.73_m2} — ABNORMAL LOW (ref >=60)
Globulin: 2.3 g/dL (calc) (ref 1.9–3.7)
Glucose, Bld: 196 mg/dL — ABNORMAL HIGH (ref 65–99)
Potassium: 4.1 mmol/L (ref 3.5–5.3)
Sodium: 139 mmol/L (ref 135–146)
Total Bilirubin: 0.8 mg/dL (ref 0.2–1.2)
Total Protein: 6.1 g/dL (ref 6.1–8.1)

## 2020-08-26 LAB — MPO/PR-3 (ANCA) ANTIBODIES
Myeloperoxidase Abs: <1 AI
Serine Protease 3: <1 AI

## 2020-08-27 IMAGING — DX DG FOREARM 2V*R*
2 series · 2 of 2 positions shown · non-contrast
Comparison: None.

CLINICAL DATA: Fall, skin abrasion/laceration, now with
redness/swelling, possible cellulitis

EXAM:
RIGHT FOREARM - 2 VIEW

[forearm ap]
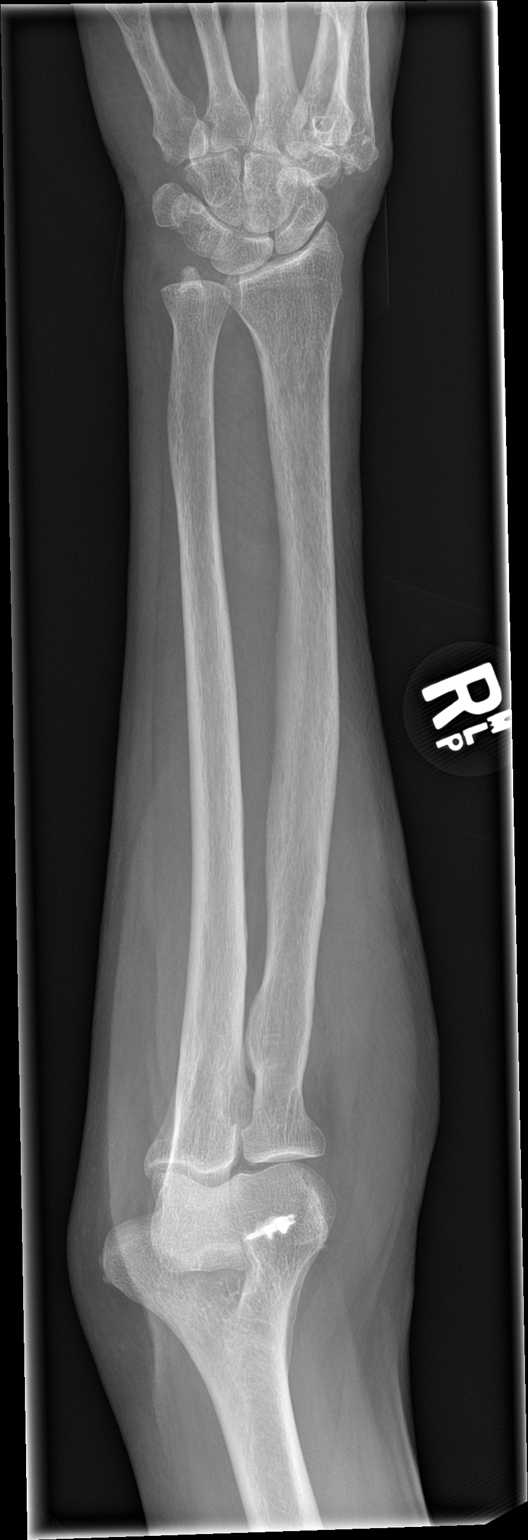

[forearm lat]
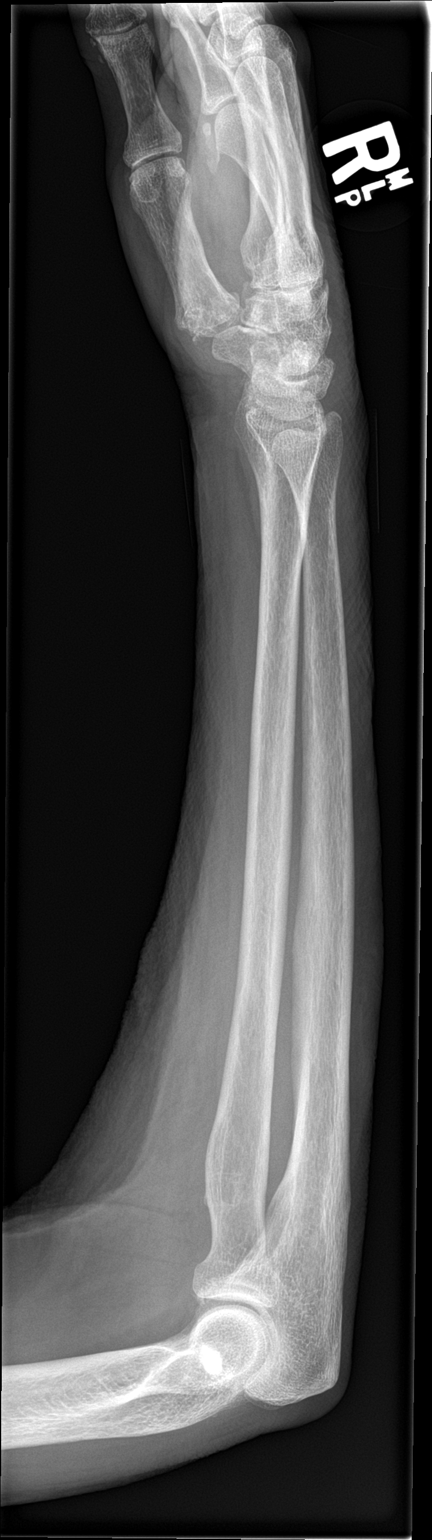

[2 of 2 positions shown; findings below may reference images not displayed]

FINDINGS: No fracture or dislocation is seen.

The joint spaces are preserved.

Visualized soft tissues are within normal limits.

Postsurgical changes at the elbow.

No radiopaque foreign body is seen.
IMPRESSION: No fracture, dislocation, or radiopaque foreign body is seen.

## 2020-08-27 IMAGING — CR DG CHEST 2V
2 series · 2 of 2 positions shown · non-contrast
Comparison: 01/17/2018

CLINICAL DATA: Fever.  Nausea.  Emesis.

EXAM:
CHEST - 2 VIEW

[w chest lat]
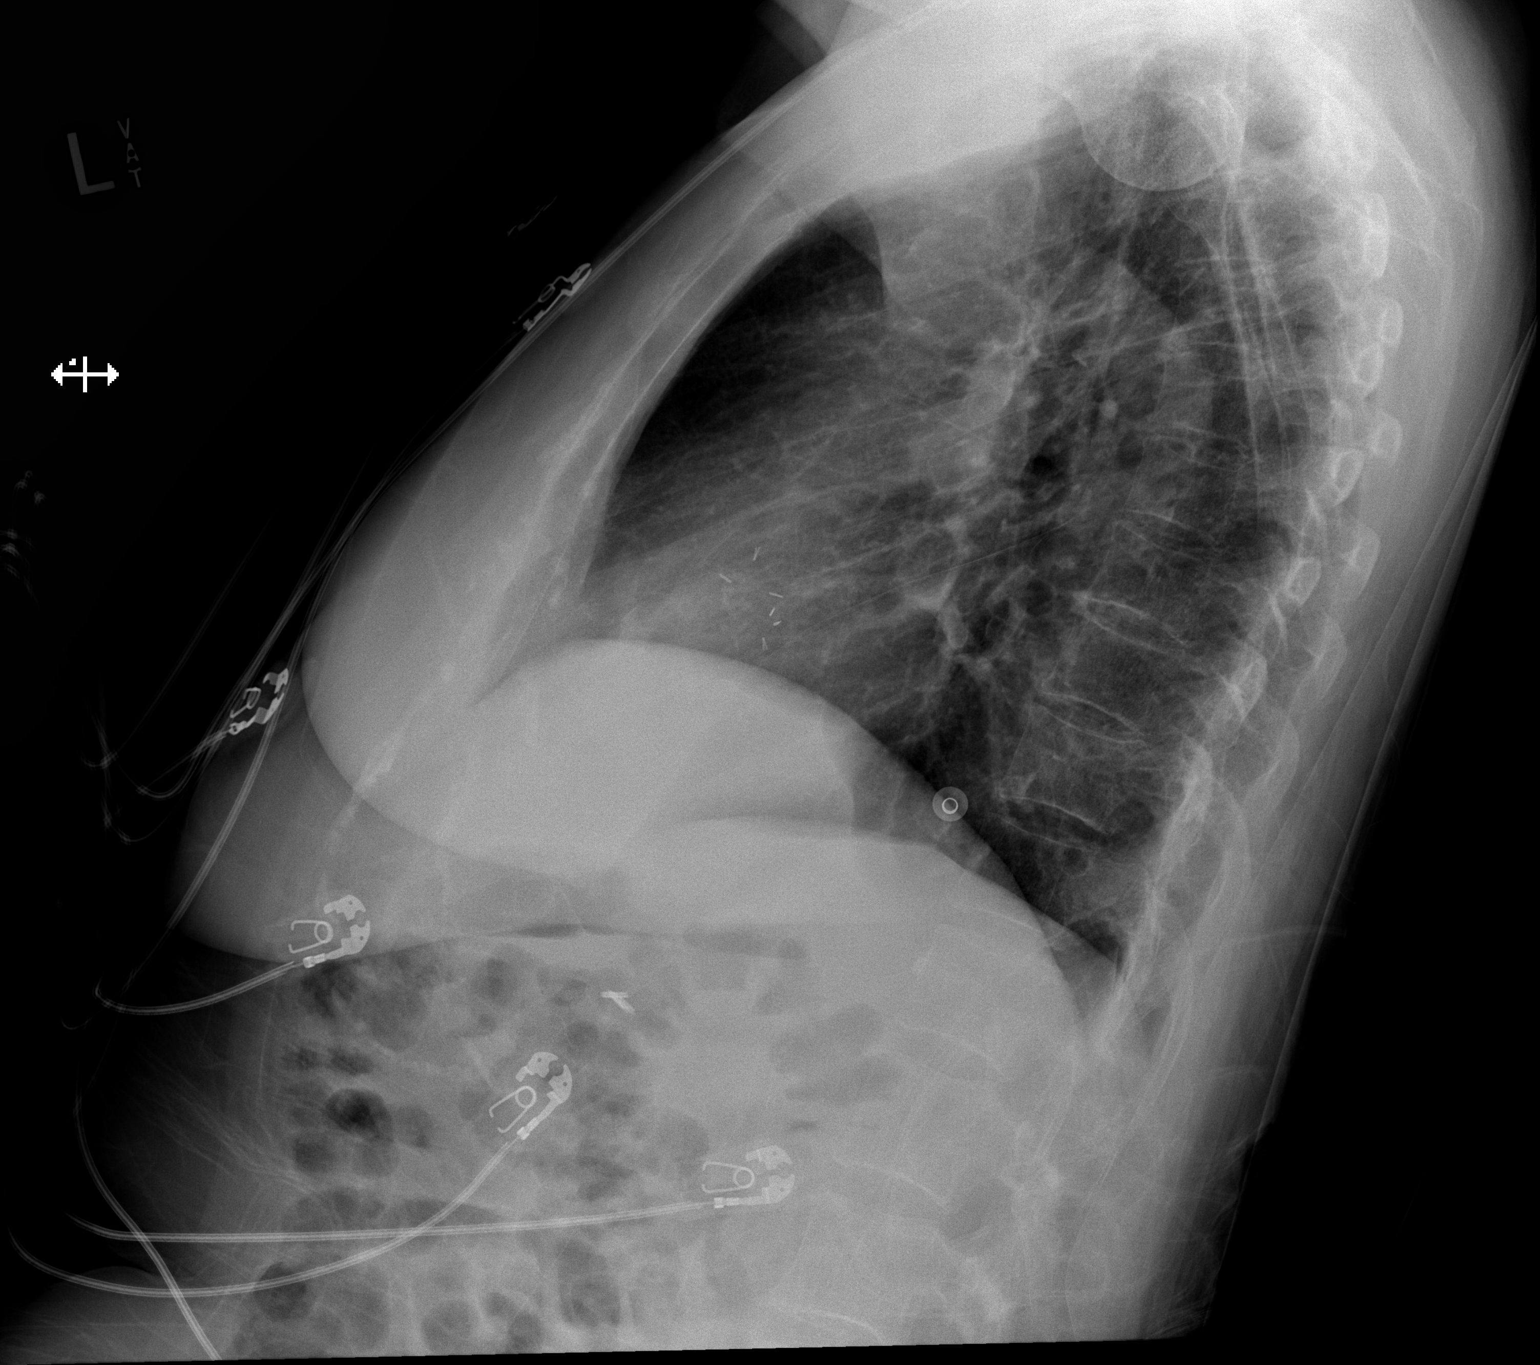

[x chest ap]
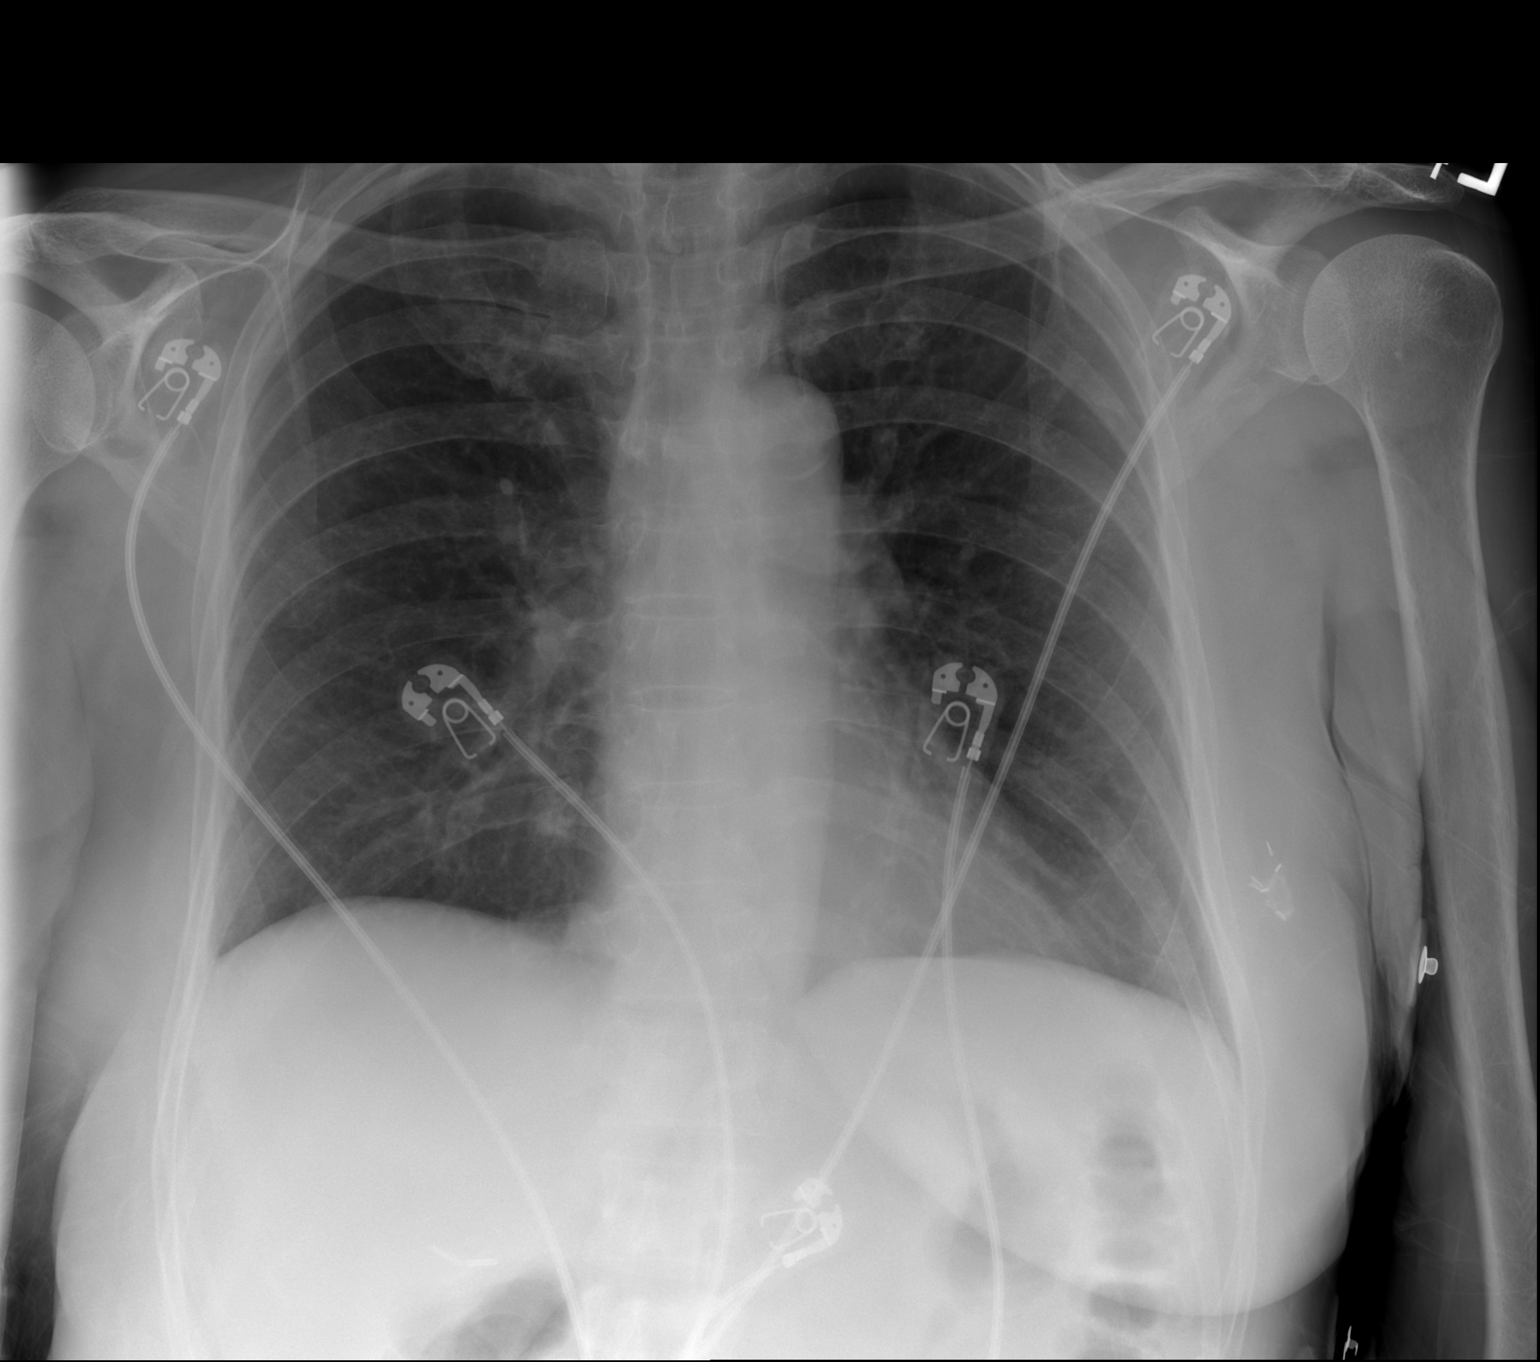

[2 of 2 positions shown; findings below may reference images not displayed]

FINDINGS: The heart size and mediastinal contours are within normal limits.
Both lungs are clear. The visualized skeletal structures are
unremarkable.
IMPRESSION: No active cardiopulmonary disease.

## 2020-08-27 IMAGING — CT CT ABD-PELV W/O CM
2 of 4 series · 13 of 36 positions shown, 16 images · non-contrast
Comparison: CT abdomen/pelvis dated 01/15/2018. CTA chest dated
12/13/2017.

CLINICAL DATA: Fever, nausea/vomiting. Cough. History of right leg
DVT and PE. History of breast cancer in remission. History of C
diff.

EXAM:
CT CHEST, ABDOMEN AND PELVIS WITHOUT CONTRAST
TECHNIQUE: Multidetector CT imaging of the chest, abdomen and pelvis was
performed following the standard protocol without IV contrast.

[Series 2: cap w/o · axial · non-contrast · 0.64mm/px · z∈[-494,+6]mm · 10 of 120 slices shown, 13 images]
[im 10/120  mediastinal]
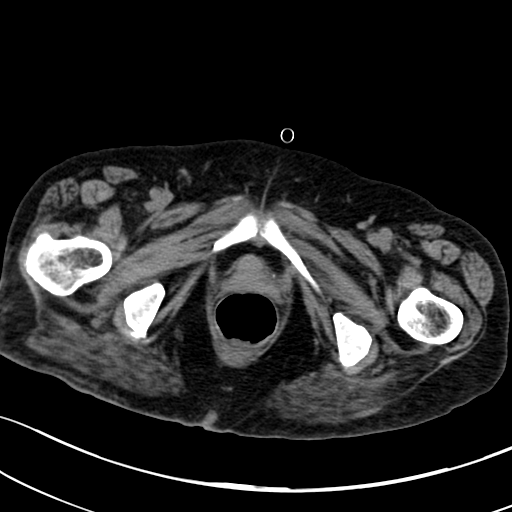
[im 10/120  lung]
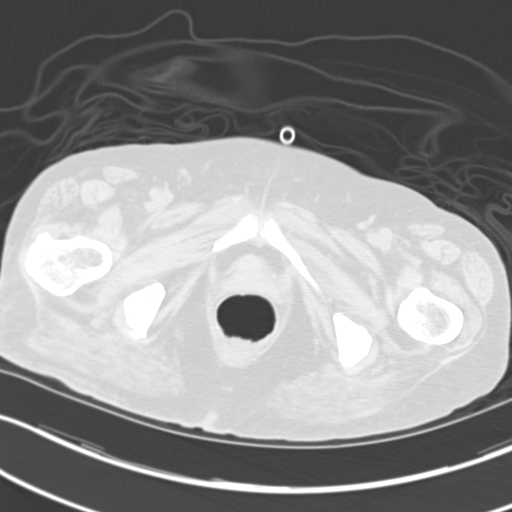
[im 19/120  lung]
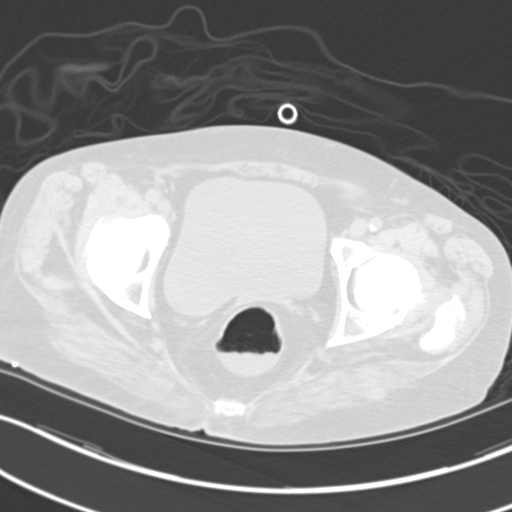
[im 37/120  lung]
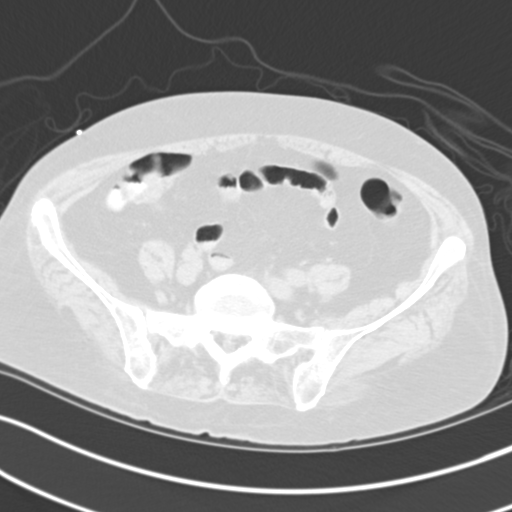
[im 46/120  lung]
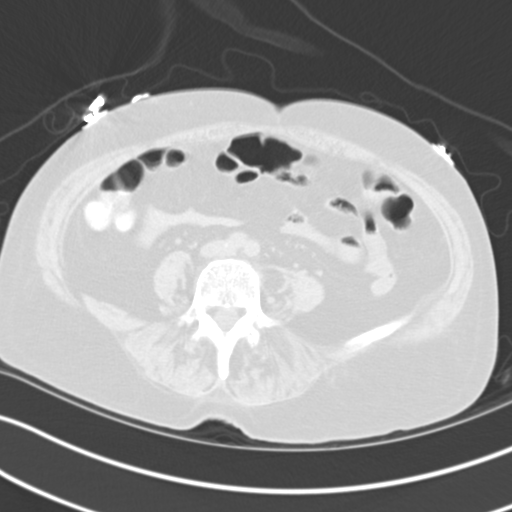
[im 55/120  mediastinal]
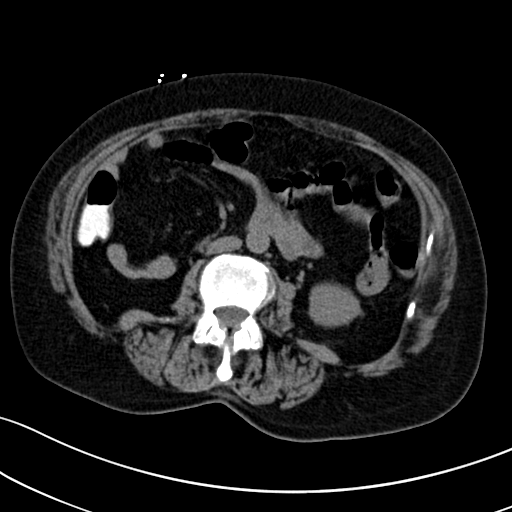
[im 55/120  lung]
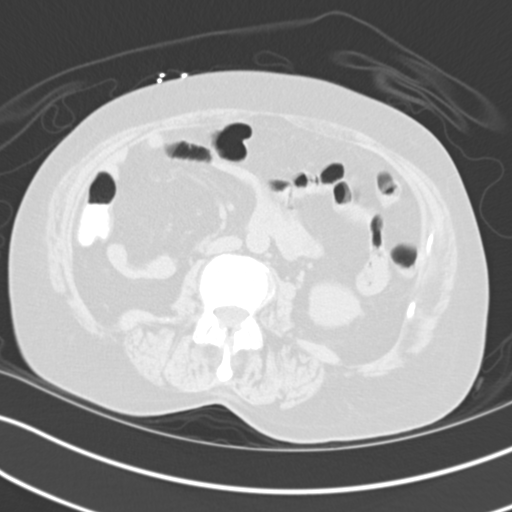
[im 65/120  lung]
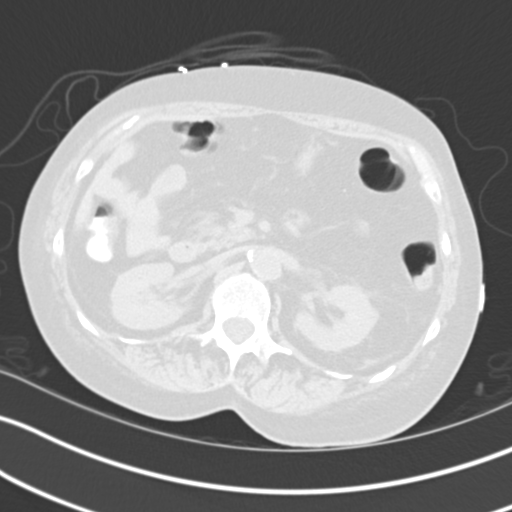
[im 74/120  lung]
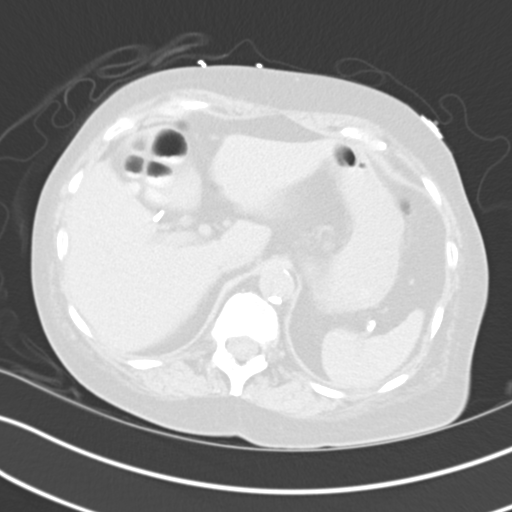
[im 92/120  lung]
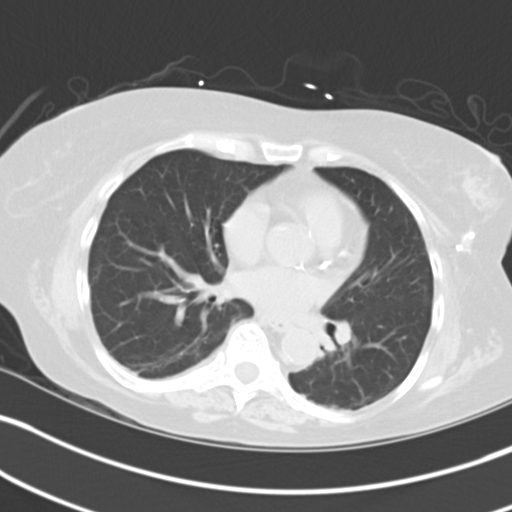
[im 101/120  mediastinal]
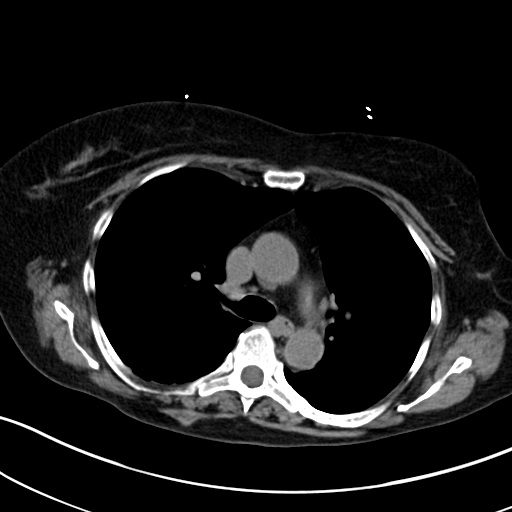
[im 101/120  lung]
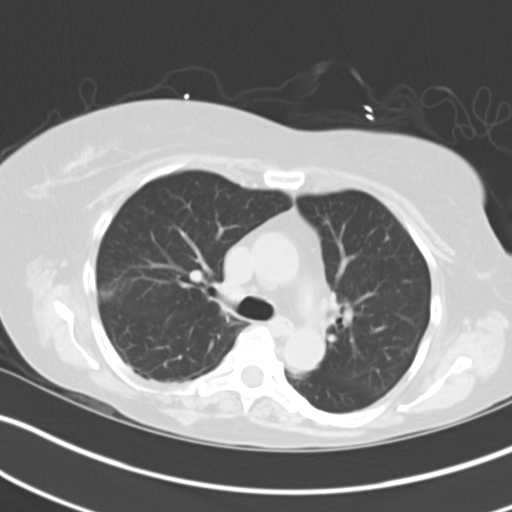
[im 110/120  lung]
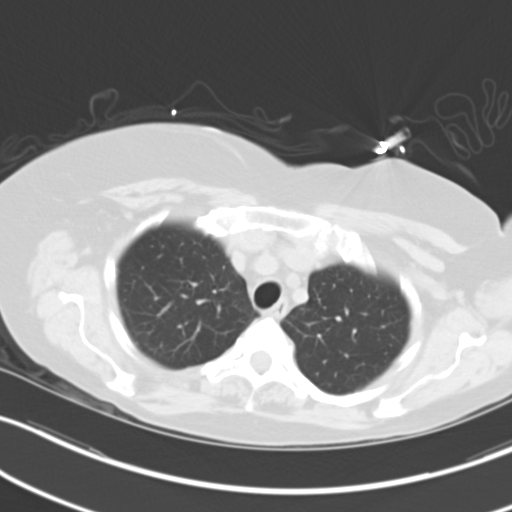

[Series 5: coronals · coronal · 0.69mm/px · 3 of 120 slices shown]
[im 24/120  lung]
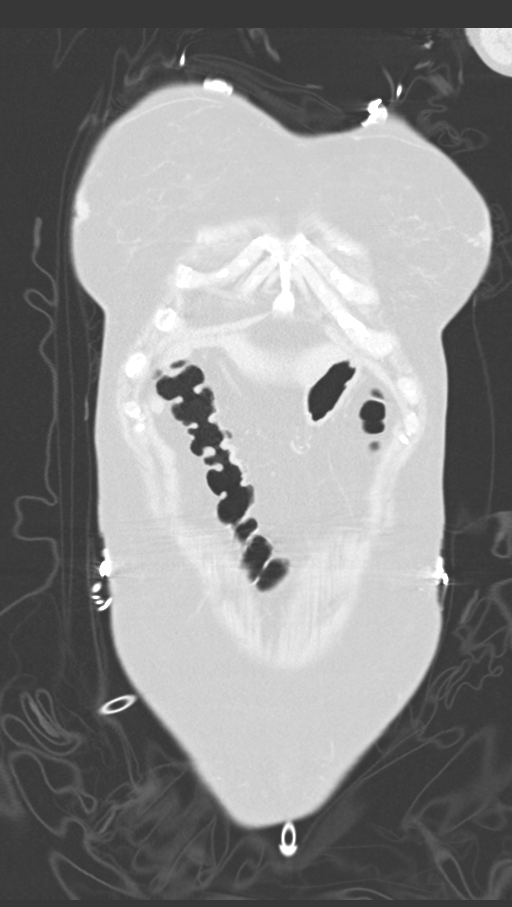
[im 48/120  lung]
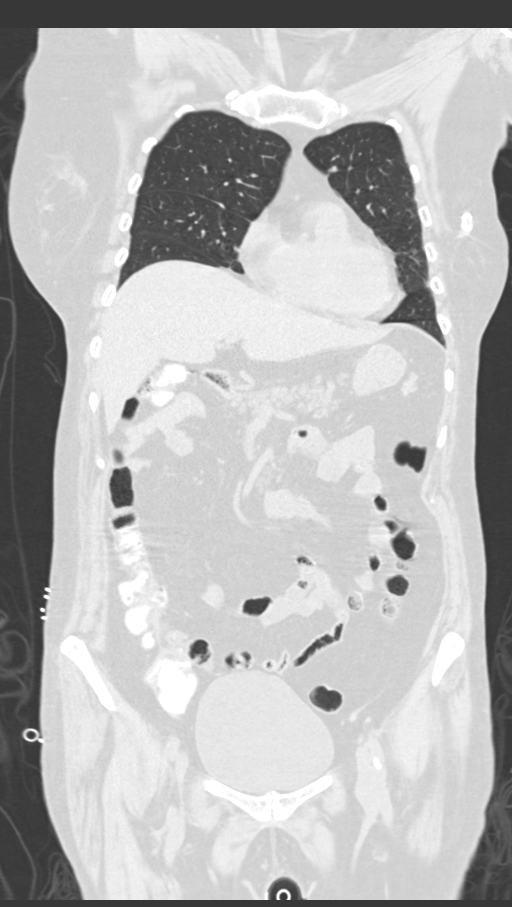
[im 72/120  lung]
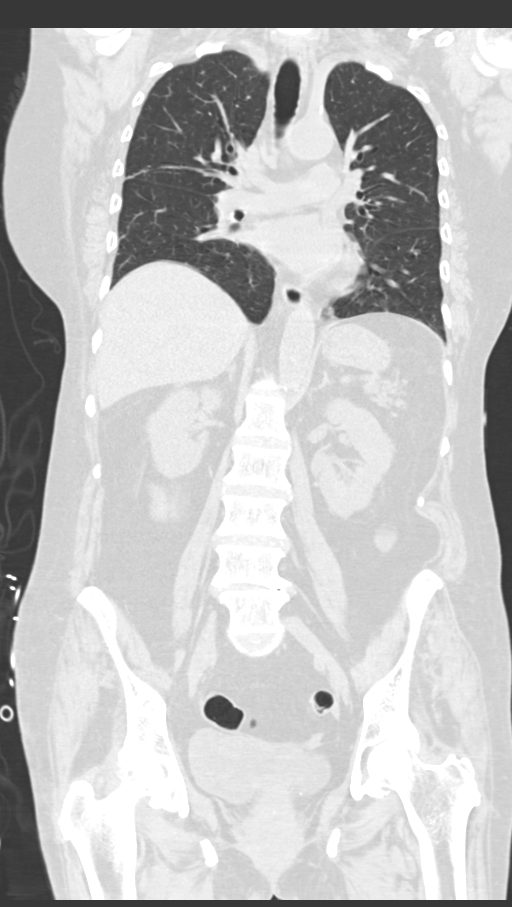

[13 of 36 positions shown; findings below may reference images not displayed]

FINDINGS: CT CHEST FINDINGS

Cardiovascular: The heart is normal in size. No pericardial
effusion.

No evidence thoracic aortic aneurysm. Atherosclerotic calcifications
of the aortic arch.

Coronary atherosclerosis the LAD and left coronary artery.

Mediastinum/Nodes: No suspicious mediastinal lymphadenopathy.

Lungs/Pleura: Minimal dependent atelectasis in the posterior right
upper and bilateral lower lobes. Mild scarring/atelectasis in the
inferior right middle lobe and lingula.

No focal consolidation.

No suspicious pulmonary nodules. 4 mm triangular subpleural nodule
in the lateral left lung base (series 4/image 102), unchanged from
1268, benign.

No pleural effusion or pneumothorax.

Musculoskeletal: Visualized osseous structures are within normal
limits.

CT ABDOMEN PELVIS FINDINGS

Hepatobiliary: Unenhanced liver is unremarkable.

Status post cholecystectomy. No intrahepatic or extrahepatic ductal
dilatation.

Pancreas: Within normal limits.

Spleen: Within normal limits.

Adrenals/Urinary Tract: Adrenal glands are within normal limits.

Kidneys are grossly unremarkable. No renal, ureteral, or bladder
calculi. No hydronephrosis.

Bladder is within normal limits.

Stomach/Bowel: Stomach is within normal limits.

No evidence of bowel obstruction.

Normal appendix (series 2/image 91).

No colonic wall thickening or inflammatory changes.

Vascular/Lymphatic: No evidence of abdominal aortic aneurysm.

Atherosclerotic calcifications of the abdominal aorta and branch
vessels.

No suspicious abdominopelvic lymphadenopathy.

Reproductive: Status post hysterectomy.

Ovaries within normal limits.  No right adnexal mass.

Other: No abdominopelvic ascites.

Musculoskeletal: Avascular necrosis of the bilateral femoral heads,
better evaluated on recent prior MRI.

Mild degenerative changes at L4-5.
IMPRESSION: No colonic wall thickening or inflammatory changes to suggest
infectious/inflammatory colitis.

No CT findings to account for the patient's fever.

Additional ancillary findings as above.

## 2020-08-30 ENCOUNTER — Encounter: Payer: Self-pay | Admitting: Family Medicine

## 2020-08-30 ENCOUNTER — Other Ambulatory Visit: Payer: Self-pay

## 2020-08-30 ENCOUNTER — Ambulatory Visit: Payer: Medicare PPO | Admitting: Family Medicine

## 2020-08-30 VITALS — BP 126/64 | HR 94 | Temp 97.7°F | Ht 61.0 in | Wt 144.0 lb

## 2020-08-30 DIAGNOSIS — I5031 Acute diastolic (congestive) heart failure: Secondary | ICD-10-CM | POA: Diagnosis not present

## 2020-08-30 LAB — BASIC METABOLIC PANEL WITH GFR
BUN/Creatinine Ratio: 37 (calc) — ABNORMAL HIGH (ref 6–22)
BUN: 72 mg/dL — ABNORMAL HIGH (ref 7–25)
CO2: 27 mmol/L (ref 20–32)
Calcium: 9.7 mg/dL (ref 8.6–10.4)
Chloride: 97 mmol/L — ABNORMAL LOW (ref 98–110)
Creat: 1.94 mg/dL — ABNORMAL HIGH (ref 0.60–1.00)
Glucose, Bld: 241 mg/dL — ABNORMAL HIGH (ref 65–99)
Potassium: 4 mmol/L (ref 3.5–5.3)
Sodium: 138 mmol/L (ref 135–146)
eGFR: 27 mL/min/{1.73_m2} — ABNORMAL LOW (ref >=60)

## 2020-08-30 MED ORDER — FUROSEMIDE 40 MG PO TABS: 40.0000 mg | ORAL_TABLET | Freq: Every day | ORAL | 3 refills | Status: DC

## 2020-08-30 MED ORDER — POTASSIUM CHLORIDE CRYS ER 20 MEQ PO TBCR: 20.0000 meq | EXTENDED_RELEASE_TABLET | Freq: Every day | ORAL | 3 refills | Status: DC

## 2020-08-30 NOTE — Progress Notes (Signed)
Subjective:    Patient ID: Ebony Scott, female    DOB: Mar 04, 1950, 69 y.o.   MRN: [id number]  HPI 08/25/20 Patient presents today with dyspnea.  Pulmonologist recently started her on prednisone and Z-Pak.  She has not picked up the Z-Pak yet but she is taking 40 mg of prednisone.  She reports shortness of breath with activity.  She also has swelling all over her body.  She has +2 pitting edema in both legs distal to the thigh.  She has swelling in her face and what appears to be bibasilar crackles in her lungs.  I believe she is fluid overloaded and I am concerned about diastolic dysfunction as a potential cause.  Also reviewed her pulmonologist last note and due to a positive p-ANCA, they had recommended checking an MPO and PR-3 panel at her next visit to rule out vasculitis as a potential cause of her opacities in the lung and shortness of breath.  She denies any rash.  She denies any hemoptysis.  She is yet to start the rituximab  CT Chest 08/23/20 IMPRESSION: 1. New trace right pleural fluid and left pleural fluid or thickening. Concurrent new mild pulmonary interlobular septal thickening. Constellation of findings suggests mild pulmonary edema/fluid overload. 2. No other explanation for patient's acute symptoms. 3. Decrease and resolution of pulmonary nodules, currently with morphology favoring scarring. 4. Coronary artery atherosclerosis. Aortic Atherosclerosis (ICD10-I70.0). 5. Aortic valvular calcifications. Consider echocardiography to evaluate for valvular dysfunction.   At that time, my plan was:  I am concerned about acute congestive heart failure secondary to diastolic dysfunction.  I will check a BNP to confirm this.  If elevated, I would repeat an echocardiogram.  Meanwhile I will give the patient Zaroxolyn 5 mg.  I want her to take this first thing in the morning and then 30 minutes later I want her to take 80 mg of Lasix.  Obtain a CMP today to obtain baseline renal function and  potassium.  Recheck Monday of next week.  After tomorrow resume her previous dose of diuretic.  This will be a one-time dose of Zaroxolyn.  Also obtain MPO and PR-3 antibodies to evaluate for possible pulmonary vasculitis  08/30/20 Patient saw no significant diuresis with Zaroxolyn.  Her BNP was elevated at over 400.  Therefore I believe the patient likely has congestive heart failure with preserved EF explaining her shortness of breath or at least contributing to her shortness of breath and certainly adding to her leg swelling. Past Medical History:  Diagnosis Date   Acute deep vein thrombosis (DVT) of right lower extremity ([place]) 12/13/2017   Allergic rhinitis    Allergy    SEASONAL   Anemia    Angio-edema    Anxiety    pt denies   Arthritis    Phreesia 10/21/2019   Asthma    Asthma    [facility]reesia 10/21/2019   Breast cancer ([place]) 1998   in remission   Cataract    REMOVED   Clostridium difficile colitis 01/14/2018   Clostridium difficile diarrhea 36/14/4315   Complication of anesthesia    "had hard time waking up from it several times" (02/20/2012)   Depression    "some; don't take anything for it" (02/20/2012)   Diverticulosis    DVT (deep venous thrombosis) (HCC)    Exertional dyspnea    Fibromyalgia 11/2011   GERD (gastroesophageal reflux disease)    Graves disease    Headache(784.0)    "related to allergies; more at  different times during the year" (02/20/2012)   Hemorrhoids    Hiatal hernia    back and neck   Hx of adenomatous colonic polyps 04/12/2016   Hypercholesteremia    good cholesterol is high   Hypothyroidism    IBS (irritable bowel syndrome)    Moderate persistent asthma    -FeV1 72% 2011, -IgE 102 2011, CT sinus Neg 2011   Osteoporosis    on reclast yearly   Peripheral vascular disease ([place]) 2019   DVTs   Pneumonia 04/2011; ~ 11/2011   "double; single" (02/20/2012)   Recurrent upper respiratory infection (URI)    Seronegative rheumatoid arthritis ([place])    Dr.  Lahoma Rocker   SIRS (systemic inflammatory response syndrome) ([place]) 02/10/2018   Tracheobronchomalacia    Past Surgical History:  Procedure Laterality Date   ABDOMINAL HYSTERECTOMY N/A    [employer]reesia 10/21/2019   ANTERIOR AND POSTERIOR REPAIR  1990's   APPENDECTOMY     BREAST LUMPECTOMY  1998   left   BREAST SURGERY N/A    Phreesia 10/21/2019   BRONCHIAL WASHINGS  04/05/2020   Procedure: BRONCHIAL WASHINGS;  Surgeon: Freddi Starr, MD;  Location: [facility];  Service: Pulmonary;;   CARPOMETACARPEL ([place]) FUSION OF THUMB WITH AUTOGRAFT FROM RADIUS  ~ 2009   "both thumbs" (02/20/2012)   CATARACT EXTRACTION W/ INTRAOCULAR LENS  IMPLANT, BILATERAL  2012   CERVICAL DISCECTOMY  10/2001   C5-C6   CERVICAL FUSION  2003   C3-C4   CHOLECYSTECTOMY     COLONOSCOPY     DEBRIDEMENT TENNIS ELBOW  ?1970's   right   ESOPHAGOGASTRODUODENOSCOPY     EYE SURGERY N/A    Phreesia 10/21/2019   HYSTERECTOMY     KNEE ARTHROPLASTY  ?1990's   "?right; w/cartilage repair" (02/20/2012)   NASAL SEPTUM SURGERY  1980's   POSTERIOR CERVICAL FUSION/FORAMINOTOMY  2004   "failed initial fusion; rewired  anterior neck" (02/20/2012)   REVERSE SHOULDER ARTHROPLASTY Right 02/16/2020   Procedure: REVERSE SHOULDER ARTHROPLASTY;  Surgeon: Marchia Bond, MD;  Location: [facility];  Service: Orthopedics;  Laterality: Right;   SPINE SURGERY N/A    [facility]reesia 10/21/2019   TONSILLECTOMY  ~ 1953   VESICOVAGINAL FISTULA CLOSURE W/ TAH  1988   VIDEO BRONCHOSCOPY Bilateral 08/23/2016   Procedure: VIDEO BRONCHOSCOPY WITH FLUORO;  Surgeon: Javier Glazier, MD;  Location: [facility];  Service: Cardiopulmonary;  Laterality: Bilateral;   VIDEO BRONCHOSCOPY N/A 04/05/2020   Procedure: VIDEO BRONCHOSCOPY WITHOUT FLUORO;  Surgeon: Freddi Starr, MD;  Location: [facility];  Service: Pulmonary;  Laterality: N/A;   Current Outpatient Medications on File Prior to Visit  Medication Sig Dispense Refill   acetaminophen (TYLENOL) 650  MG CR tablet Take 1,300 mg by mouth every 8 (eight) hours as needed for pain.     albuterol (PROVENTIL) (2.5 MG/3ML) 0.083% nebulizer solution USE 1 AMPULE IN NEBULIZER IN AM AT BEDTIME ALONG WITH FLUTTER VALVE 180 mL 5   albuterol (VENTOLIN HFA) 108 (90 Base) MCG/ACT inhaler INHALE 2 PUFFS BY MOUTH INTO THE LUNGS EVERY 4 HOURS AS NEEDED FOR WHEEZING OR SHORTNESS OF BREATH 8.5 g 3   allopurinol (ZYLOPRIM) 100 MG tablet Take 100 mg by mouth daily.     Alpha-D-Galactosidase (BEANO PO) Take 2-3 capsules by mouth 3 (three) times daily with meals.     Alpha-Lipoic Acid 600 MG CAPS Take 600 mg by mouth daily.      ammonium lactate (AMLACTIN) 12 % cream Apply topically as needed for  dry skin. (Patient taking differently: Apply 1 g topically at bedtime.) 385 g 2   atorvastatin (LIPITOR) 10 MG tablet TAKE 1 TABLET BY MOUTH DAILY 90 tablet 3   azelastine (ASTELIN) 0.1 % nasal spray USE 2 SPRAYS IN EACH NOSTRIL DAILY AS DIRECTED (Patient taking differently: Place 2 sprays into both nostrils daily as needed for allergies.) 30 mL 5   Calcium Citrate (CITRACAL PO) Take 1 tablet by mouth in the morning and at bedtime.     CALCIUM-MAGNESIUM PO Take 1 tablet by mouth 2 (two) times daily.     cetirizine (ZYRTEC) 10 MG tablet Take 10 mg by mouth daily.     cholecalciferol (VITAMIN D) 25 MCG (1000 UT) tablet Take 1,000 Units by mouth daily.      COVID-19 mRNA vaccine, Pfizer, 30 MCG/0.3ML injection INJECT AS DIRECTED .3 mL 0   cyproheptadine (PERIACTIN) 4 MG tablet TAKE 2 TABLETS BY MOUTH EVERY EVENING (Patient taking differently: Take 8 mg by mouth every evening.) 180 tablet 2   denosumab (PROLIA) 60 MG/ML SOLN injection Inject 60 mg into the skin every 6 (six) months. Administer in upper arm, thigh, or abdomen     DEXILANT 60 MG capsule TAKE 1 CAPSULE BY MOUTH EACH MORNING (Patient taking differently: Take 60 mg by mouth daily.) 90 capsule 3   diclofenac Sodium (VOLTAREN) 1 % GEL Apply 2 g topically 4 (four) times  daily as needed (joint pain). 100 g 6   EPINEPHrine (EPIPEN 2-PAK) 0.3 mg/0.3 mL IJ SOAJ injection Inject 0.3 mLs (0.3 mg total) into the muscle Once PRN. 0.3 mL 2   famotidine (PEPCID) 20 MG tablet TAKE 1 TABLET BY MOUTH TWICE A DAY (Patient taking differently: Take 20 mg by mouth 2 (two) times daily.) 60 tablet 5   fluticasone (FLONASE) 50 MCG/ACT nasal spray USE 2 SPRAYS IN EACH NOSTRIL DAILY 16 g 2   folic acid (FOLVITE) 1 MG tablet Take 1 mg by mouth daily.     furosemide (LASIX) 20 MG tablet Take 1 tablet (20 mg total) by mouth every evening. Take 40 mg in the AM, and take additional 20 mg in the PM for swelling (Patient taking differently: Take 20 mg by mouth 2 (two) times daily. Take 40 mg in the AM, and take additional 20 mg in the PM for swelling) 30 tablet 3   furosemide (LASIX) 40 MG tablet Take 1 tablet (40 mg total) by mouth daily as needed. 30 tablet 3   gabapentin (NEURONTIN) 600 MG tablet TAKE 1 TABLET BY MOUTH DAILY (Patient taking differently: Take 600 mg by mouth at bedtime.) 90 tablet 3   guaiFENesin (MUCINEX) 600 MG 12 hr tablet Take 1 tablet (600 mg total) by mouth 2 (two) times daily as needed for cough or to loosen phlegm. 60 tablet 1   hydrocortisone 2.5 % cream Apply topically 2 (two) times daily as needed (hemorrhoids). 28 g 3   Lactase 9000 units TABS Take 18,000-27,000 Units by mouth 4 (four) times daily as needed (dairy consumption).     lidocaine (LIDODERM) 5 % Place 1 patch onto the skin daily as needed (pain). Remove & Discard patch within 12 hours or as directed by MD     lidocaine (LIDODERM) 5 % APPLY 1 PATCH TOPICALLY AS DIRECTED 30 patch 0   magnesium oxide (MAG-OX) 400 MG tablet Take 400 mg by mouth daily.     metolazone (ZAROXOLYN) 5 MG tablet Take 1 tablet (5 mg total) by mouth daily. 30 tablet  0   mirtazapine (REMERON SOL-TAB) 30 MG disintegrating tablet DISSOLVE 1 TABLET BY MOUTH EVERY NIGHT AT BEDTIME 90 tablet 1   MITIGARE 0.6 MG CAPS Take 0.6 mg by mouth  every other day.     montelukast (SINGULAIR) 10 MG tablet TAKE 1 TABLET BY MOUTH DAILY (Patient taking differently: Take 10 mg by mouth every evening.) 30 tablet 5   mupirocin ointment (BACTROBAN) 2 % APPLY TO BED SORES AS NEEDED 22 g 0   Nystatin (GERHARDT'S BUTT CREAM) CREA Apply 1 application topically daily as needed (hemorrhoids). NYSTATIN 100,000 UNIT/GM CR 100000 CRM- 60 g, ZINC OXIDE 20 % OINT 20 OINT- 60 g, HYDROCORTISONE 1 % CREAM- 60 g 1 each 11   Nystatin (GERHARDT'S BUTT CREAM) CREA Apply to the affected areas daily as needed for hemorrhoids 1 each 11   nystatin (MYCOSTATIN) 100000 UNIT/ML suspension SWISH AND SPIT 5 MLS TWICE DAILY (Patient taking differently: Use as directed 5 mLs in the mouth or throat at bedtime.) 473 mL 3   nystatin cream (MYCOSTATIN) Apply 1 application topically 2 (two) times daily.     nystatin-triamcinolone ointment (MYCOLOG) Apply topically 2 (two) times daily. (Patient taking differently: Apply 1 application topically 2 (two) times daily as needed (yeast infections).) 90 g 1   ondansetron (ZOFRAN) 4 MG tablet Take 1 tablet (4 mg total) by mouth every 8 (eight) hours as needed for nausea or vomiting. 10 tablet 0   predniSONE (DELTASONE) 5 MG tablet Take 15 mg by mouth daily with breakfast.     Prenatal Vit-Fe Fumarate-FA (PRENATAL MULTIVITAMIN) TABS tablet Take 1 tablet by mouth daily.     Probiotic Product (ALIGN PO) Take 1 capsule by mouth daily.     Propylene Glycol (SYSTANE COMPLETE) 0.6 % SOLN Place 1 drop into both eyes 2 (two) times daily as needed (dry eyes).     Respiratory Therapy Supplies (FLUTTER) DEVI Use as directed 1 each 0   Salicylic Acid-Urea (KERASAL EX) Apply 1 application topically at bedtime.     sodium chloride HYPERTONIC 3 % nebulizer solution Take by nebulization 2 (two) times daily. 750 mL 12   Spacer/Aero-Holding Chambers (AEROCHAMBER PLUS) inhaler Use as instructed 1 each 0   Tiotropium Bromide-Olodaterol (STIOLTO RESPIMAT) 2.5-2.5  MCG/ACT AERS Inhale 2 puffs into the lungs daily. 4 g 5   XARELTO 20 MG TABS tablet TAKE 1 TABLET BY MOUTH DAILY WITH SUPPER (Patient taking differently: Take 20 mg by mouth every evening.) 90 tablet 3   benzonatate (TESSALON) 200 MG capsule TAKE 1 CAPSULE BY MOUTH TWICE DAILY AS NEEDED FOR COUGH (Patient not taking: Reported on 08/30/2020) 20 capsule 1   Current Facility-Administered Medications on File Prior to Visit  Medication Dose Route Frequency Provider Last Rate Last Admin   denosumab (PROLIA) injection 60 mg  60 mg Subcutaneous Q6 months Zeanna Sunde T, MD   60 mg at 03/31/20 1224   Allergies  Allergen Reactions   Dust Mite Extract Shortness Of Breath and Other (See Comments)    "sneezing" (02/20/2012)   Molds & Smuts Shortness Of Breath   Morphine And Related Hives and Itching   Other Shortness Of Breath and Other (See Comments)    Grass and weeds "sneezing; filled sinuses" (02/20/2012)   Penicillins Rash and Other (See Comments)    "welts" (02/20/2012) Has patient had a PCN reaction causing immediate rash, facial/tongue/throat swelling, SOB or lightheadedness with hypotension: Unknown Has patient had a PCN reaction causing severe rash involving mucus membranes or skin  necrosis: No Has patient had a PCN reaction that required hospitalization: No Has patient had a PCN reaction occurring within the last 10 years: No If all of the above answers are "NO", then may proceed with Cephalosporin use. Tolerated Ancef 02/16/20.    Reclast [Zoledronic Acid] Other (See Comments)    Fever, Put in hospital, dr said it was a reaction from a reaction    Rofecoxib Swelling    Vioxx REACTION: feet swelling   Shellfish Allergy Anaphylaxis, Shortness Of Breath, Itching and Rash   Tetracycline Hcl Nausea And Vomiting   Xolair [Omalizumab] Other (See Comments)    Caused Blood clot   Dilaudid [Hydromorphone Hcl] Itching   Hydrocodone-Acetaminophen Nausea And Vomiting   Levofloxacin Other (See  Comments)    GI upset   Oxycodone Hcl Nausea And Vomiting   Paroxetine Nausea And Vomiting    Paxil    Baclofen Other (See Comments)    Altered mental status requiring 3 day hospital stay    Celecoxib Swelling    Feet swelling   Diltiazem Swelling   Lactose Intolerance (Gi) Other (See Comments)    Bloating and gas   Tree Extract Other (See Comments)    "tested and told I was allergic to it; never experienced a reaction to it" (02/20/2012)   Social History   Socioeconomic History   Marital status: Divorced    Spouse name: Not on file   Number of children: 2   Years of education: college   Highest education level: Not on file  Occupational History   Occupation: Disabled    Comment: Retired Engineer, production: RETIRED  Tobacco Use   Smoking status: Never    Passive exposure: Yes   Smokeless tobacco: Never   Tobacco comments:    Parents  Vaping Use   Vaping Use: Never used  Substance and Sexual Activity   Alcohol use: Not Currently    Alcohol/week: 0.0 standard drinks   Drug use: No   Sexual activity: Never  Other Topics Concern   Not on file  Social History Narrative   Patient lives at home alone. Patient  divorced.    Patient has her BS degree.   Right handed.   Caffeine- sometimes coffee.      [facility]:   Born in [place], [place]. She worked as a Copywriter, advertising. She has no pets currently. She does have indoor plants. Previously had mold in her home that was remediated. Carpet was removed.          Social Determinants of Health   Financial Resource Strain: Low Risk    Difficulty of Paying Living Expenses: Not hard at all  Food Insecurity: No Food Insecurity   Worried About Charity fundraiser in the Last Year: Never true   [place] in the Last Year: Never true  Transportation Needs: No Transportation Needs   Lack of Transportation (Medical): No   Lack of Transportation (Non-Medical): No  Physical Activity: Inactive    Days of Exercise per Week: 0 days   Minutes of Exercise per Session: 0 min  Stress: No Stress Concern Present   Feeling of Stress : Not at all  Social Connections: Moderately Isolated   Frequency of Communication with Friends and Family: More than three times a week   Frequency of Social Gatherings with Friends and Family: More than three times a week   Attends Religious Services: Never   Marine scientist or  Organizations: Yes   Attends Archivist Meetings: Never   Marital Status: Divorced  Human resources officer Violence: Not At Risk   Fear of Current or Ex-Partner: No   Emotionally Abused: No   Physically Abused: No   Sexually Abused: No     Review of Systems     Objective:   Physical Exam Vitals reviewed.  Constitutional:      General: She is not in acute distress.    Appearance: Normal appearance. She is not ill-appearing or toxic-appearing.  HENT:     Right Ear: Tympanic membrane and ear canal normal.     Left Ear: Tympanic membrane and ear canal normal.     Nose: No congestion or rhinorrhea.  Cardiovascular:     Rate and Rhythm: Normal rate and regular rhythm.     Heart sounds: Normal heart sounds.  Pulmonary:     Effort: Pulmonary effort is normal. No respiratory distress.     Breath sounds: No stridor or decreased air movement. Rales present. No wheezing.  Abdominal:     General: Bowel sounds are normal. There is no distension.     Palpations: Abdomen is soft.     Tenderness: There is no abdominal tenderness. There is no guarding.  Musculoskeletal:     Right lower leg: Edema present.     Left lower leg: Edema present.  Skin:    Findings: No erythema.  Neurological:     Mental Status: She is alert and oriented to person, place, and time.     Cranial Nerves: No cranial nerve deficit.     Sensory: No sensory deficit.     Motor: No weakness.     Coordination: Coordination normal.  Moon facies, buffalo hump       Assessment & Plan:  Acute  diastolic heart failure (HCC) - Plan: BASIC METABOLIC PANEL WITH GFR, ECHOCARDIOGRAM COMPLETE Increase Lasix to 80 mg in the morning and 40 mg in the evening.  Add K. Dur 20 mill equivalents a day.  Recheck weight and blood pressure and renal function and potassium in 1 week.  Schedule the patient for an echocardiogram to evaluate for worsening of her ejection fraction as well as possible pulmonary hypertension or right ventricular strain given her underlying pulmonary disease.

## 2020-09-02 ENCOUNTER — Other Ambulatory Visit: Payer: Self-pay | Admitting: Allergy and Immunology

## 2020-09-02 ENCOUNTER — Other Ambulatory Visit: Payer: Self-pay | Admitting: Pulmonary Disease

## 2020-09-02 DIAGNOSIS — J4550 Severe persistent asthma, uncomplicated: Secondary | ICD-10-CM

## 2020-09-02 NOTE — Telephone Encounter (Signed)
Please advise to refill as montelukast (singulair) was not mentioned on last avs

## 2020-09-06 ENCOUNTER — Ambulatory Visit: Payer: Medicare PPO | Admitting: Family Medicine

## 2020-09-06 ENCOUNTER — Telehealth: Payer: Self-pay | Admitting: Pulmonary Disease

## 2020-09-06 ENCOUNTER — Other Ambulatory Visit: Payer: Self-pay

## 2020-09-06 ENCOUNTER — Encounter: Payer: Self-pay | Admitting: Family Medicine

## 2020-09-06 VITALS — BP 128/72 | HR 102 | Temp 97.9°F | Resp 18 | Ht 61.0 in | Wt 143.0 lb

## 2020-09-06 DIAGNOSIS — I5031 Acute diastolic (congestive) heart failure: Secondary | ICD-10-CM

## 2020-09-06 LAB — COMPLETE METABOLIC PANEL WITH GFR
AG Ratio: 1.7 (calc) (ref 1.0–2.5)
ALT: 74 U/L — ABNORMAL HIGH (ref 6–29)
AST: 60 U/L — ABNORMAL HIGH (ref 10–35)
Albumin: 3.8 g/dL (ref 3.6–5.1)
Alkaline phosphatase (APISO): 72 U/L (ref 37–153)
BUN/Creatinine Ratio: 31 (calc) — ABNORMAL HIGH (ref 6–22)
BUN: 51 mg/dL — ABNORMAL HIGH (ref 7–25)
CO2: 27 mmol/L (ref 20–32)
Calcium: 9.5 mg/dL (ref 8.6–10.4)
Chloride: 97 mmol/L — ABNORMAL LOW (ref 98–110)
Creat: 1.65 mg/dL — ABNORMAL HIGH (ref 0.60–1.00)
Globulin: 2.3 g/dL (calc) (ref 1.9–3.7)
Glucose, Bld: 293 mg/dL — ABNORMAL HIGH (ref 65–99)
Potassium: 4.6 mmol/L (ref 3.5–5.3)
Sodium: 135 mmol/L (ref 135–146)
Total Bilirubin: 0.5 mg/dL (ref 0.2–1.2)
Total Protein: 6.1 g/dL (ref 6.1–8.1)
eGFR: 33 mL/min/{1.73_m2} — ABNORMAL LOW (ref >=60)

## 2020-09-06 NOTE — Telephone Encounter (Signed)
Called and spoke with patient. She states that she has been wheezing for the past couple days. States the wheezing has been constant for the past 1 1/2 days. She went to see her PCP today. He gave her a sample of Trelegy. Patient states that her PCP feels she needs to be on Trelegy and not both Spiriva and Stiolto. I did not see in the chart where she was on Spiriva.   Patient is due a follow up so she is scheduled for 8/10.   Dr. Erin Fulling please advise

## 2020-09-06 NOTE — Progress Notes (Signed)
Subjective:    Patient ID: Ebony Scott, female    DOB: 03-04-1950, 70 y.o.   MRN: [id number]  HPI 08/25/20 Patient presents today with dyspnea.  Pulmonologist recently started her on prednisone and Z-Pak.  She has not picked up the Z-Pak yet but she is taking 40 mg of prednisone.  She reports shortness of breath with activity.  She also has swelling all over her body.  She has +2 pitting edema in both legs distal to the thigh.  She has swelling in her face and what appears to be bibasilar crackles in her lungs.  I believe she is fluid overloaded and I am concerned about diastolic dysfunction as a potential cause.  Also reviewed her pulmonologist last note and due to a positive p-ANCA, they had recommended checking an MPO and PR-3 panel at her next visit to rule out vasculitis as a potential cause of her opacities in the lung and shortness of breath.  She denies any rash.  She denies any hemoptysis.  She is yet to start the rituximab  CT Chest 08/23/20 IMPRESSION: 1. New trace right pleural fluid and left pleural fluid or thickening. Concurrent new mild pulmonary interlobular septal thickening. Constellation of findings suggests mild pulmonary edema/fluid overload. 2. No other explanation for patient's acute symptoms. 3. Decrease and resolution of pulmonary nodules, currently with morphology favoring scarring. 4. Coronary artery atherosclerosis. Aortic Atherosclerosis (ICD10-I70.0). 5. Aortic valvular calcifications. Consider echocardiography to evaluate for valvular dysfunction.   At that time, my plan was:  I am concerned about acute congestive heart failure secondary to diastolic dysfunction.  I will check a BNP to confirm this.  If elevated, I would repeat an echocardiogram.  Meanwhile I will give the patient Zaroxolyn 5 mg.  I want her to take this first thing in the morning and then 30 minutes later I want her to take 80 mg of Lasix.  Obtain a CMP today to obtain baseline renal function and  potassium.  Recheck Monday of next week.  After tomorrow resume her previous dose of diuretic.  This will be a one-time dose of Zaroxolyn.  Also obtain MPO and PR-3 antibodies to evaluate for possible pulmonary vasculitis  08/30/20 Patient saw no significant diuresis with Zaroxolyn.  Her BNP was elevated at over 400.  Therefore I believe the patient likely has congestive heart failure with preserved EF explaining her shortness of breath or at least contributing to her shortness of breath and certainly adding to her leg swelling.  At that time, my plan was:  Increase Lasix to 80 mg in the morning and 40 mg in the evening.  Add K. Dur 20 mill equivalents a day.  Recheck weight and blood pressure and renal function and potassium in 1 week.  Schedule the patient for an echocardiogram to evaluate for worsening of her ejection fraction as well as possible pulmonary hypertension or right ventricular strain given her underlying pulmonary disease.  09/06/20 Patient's weight is unchanged.  This is despite significantly increasing her diuretic.  Awaiting the results of her echocardiogram.  She states that her breathing has worsened.  On exam today there are no crackles or rails however she is wheezing in all 4 lung fields.  She recently discontinued the 40 mg a day of prednisone.  She denies any fevers or chills however she does report more shortness of breath.  She continues to have pitting edema in both legs distal to the knee. Past Medical History:  Diagnosis Date  Acute deep vein thrombosis (DVT) of right lower extremity (HCC) 12/13/2017   Allergic rhinitis    Allergy    SEASONAL   Anemia    Angio-edema    Anxiety    pt denies   Arthritis    Phreesia 10/21/2019   Asthma    Asthma    Phreesia 10/21/2019   Breast cancer ([place]) 1998   in remission   Cataract    REMOVED   Clostridium difficile colitis 01/14/2018   Clostridium difficile diarrhea 60/11/9321   Complication of anesthesia    "had hard  time waking up from it several times" (02/20/2012)   Depression    "some; don't take anything for it" (02/20/2012)   Diverticulosis    DVT (deep venous thrombosis) (HCC)    Exertional dyspnea    Fibromyalgia 11/2011   GERD (gastroesophageal reflux disease)    Graves disease    Headache(784.0)    "related to allergies; more at different times during the year" (02/20/2012)   Hemorrhoids    Hiatal hernia    back and neck   Hx of adenomatous colonic polyps 04/12/2016   Hypercholesteremia    good cholesterol is high   Hypothyroidism    IBS (irritable bowel syndrome)    Moderate persistent asthma    -FeV1 72% 2011, -IgE 102 2011, CT sinus Neg 2011   Osteoporosis    on reclast yearly   Peripheral vascular disease ([place]) 2019   DVTs   Pneumonia 04/2011; ~ 11/2011   "double; single" (02/20/2012)   Recurrent upper respiratory infection (URI)    Seronegative rheumatoid arthritis ([place])    Dr. Lahoma Rocker   SIRS (systemic inflammatory response syndrome) ([place]) 02/10/2018   Tracheobronchomalacia    Past Surgical History:  Procedure Laterality Date   ABDOMINAL HYSTERECTOMY N/A    Phreesia 10/21/2019   ANTERIOR AND POSTERIOR REPAIR  1990's   APPENDECTOMY     BREAST LUMPECTOMY  1998   left   BREAST SURGERY N/A    Phreesia 10/21/2019   BRONCHIAL WASHINGS  04/05/2020   Procedure: BRONCHIAL WASHINGS;  Surgeon: Freddi Starr, MD;  Location: [facility];  Service: Pulmonary;;   CARPOMETACARPEL ([place]) FUSION OF THUMB WITH AUTOGRAFT FROM RADIUS  ~ 2009   "both thumbs" (02/20/2012)   CATARACT EXTRACTION W/ INTRAOCULAR LENS  IMPLANT, BILATERAL  2012   CERVICAL DISCECTOMY  10/2001   C5-C6   CERVICAL FUSION  2003   C3-C4   CHOLECYSTECTOMY     COLONOSCOPY     DEBRIDEMENT TENNIS ELBOW  ?1970's   right   ESOPHAGOGASTRODUODENOSCOPY     EYE SURGERY N/A    Phreesia 10/21/2019   HYSTERECTOMY     KNEE ARTHROPLASTY  ?1990's   "?right; w/cartilage repair" (02/20/2012)   NASAL SEPTUM SURGERY  1980's    POSTERIOR CERVICAL FUSION/FORAMINOTOMY  2004   "failed initial fusion; rewired  anterior neck" (02/20/2012)   REVERSE SHOULDER ARTHROPLASTY Right 02/16/2020   Procedure: REVERSE SHOULDER ARTHROPLASTY;  Surgeon: Marchia Bond, MD;  Location: [facility];  Service: Orthopedics;  Laterality: Right;   SPINE SURGERY N/A    Phreesia 10/21/2019   TONSILLECTOMY  ~ 1953   VESICOVAGINAL FISTULA CLOSURE W/ TAH  1988   VIDEO BRONCHOSCOPY Bilateral 08/23/2016   Procedure: VIDEO BRONCHOSCOPY WITH FLUORO;  Surgeon: Javier Glazier, MD;  Location: [facility];  Service: Cardiopulmonary;  Laterality: Bilateral;   VIDEO BRONCHOSCOPY N/A 04/05/2020   Procedure: VIDEO BRONCHOSCOPY WITHOUT FLUORO;  Surgeon: Freddi Starr, MD;  Location: [facility];  Service: Pulmonary;  Laterality: N/A;   Current Outpatient Medications on File Prior to Visit  Medication Sig Dispense Refill   acetaminophen (TYLENOL) 650 MG CR tablet Take 1,300 mg by mouth every 8 (eight) hours as needed for pain.     albuterol (PROVENTIL) (2.5 MG/3ML) 0.083% nebulizer solution USE 1 AMPULE IN NEBULIZER IN AM AT BEDTIME ALONG WITH FLUTTER VALVE 180 mL 5   albuterol (VENTOLIN HFA) 108 (90 Base) MCG/ACT inhaler INHALE 2 PUFFS BY MOUTH INTO THE LUNGS EVERY 4 HOURS AS NEEDED FOR WHEEZING OR SHORTNESS OF BREATH 8.5 g 3   allopurinol (ZYLOPRIM) 100 MG tablet Take 100 mg by mouth daily.     Alpha-D-Galactosidase (BEANO PO) Take 2-3 capsules by mouth 3 (three) times daily with meals.     Alpha-Lipoic Acid 600 MG CAPS Take 600 mg by mouth daily.      ammonium lactate (AMLACTIN) 12 % cream Apply topically as needed for dry skin. (Patient taking differently: Apply 1 g topically at bedtime.) 385 g 2   atorvastatin (LIPITOR) 10 MG tablet TAKE 1 TABLET BY MOUTH DAILY 90 tablet 3   azelastine (ASTELIN) 0.1 % nasal spray USE 2 SPRAYS IN EACH NOSTRIL DAILY AS DIRECTED (Patient taking differently: Place 2 sprays into both nostrils daily as needed for allergies.) 30  mL 5   benzonatate (TESSALON) 200 MG capsule TAKE 1 CAPSULE BY MOUTH TWICE DAILY AS NEEDED FOR COUGH 20 capsule 1   Calcium Citrate (CITRACAL PO) Take 1 tablet by mouth in the morning and at bedtime.     CALCIUM-MAGNESIUM PO Take 1 tablet by mouth 2 (two) times daily.     cetirizine (ZYRTEC) 10 MG tablet Take 10 mg by mouth daily.     cholecalciferol (VITAMIN D) 25 MCG (1000 UT) tablet Take 1,000 Units by mouth daily.      COVID-19 mRNA vaccine, Pfizer, 30 MCG/0.3ML injection INJECT AS DIRECTED .3 mL 0   cyproheptadine (PERIACTIN) 4 MG tablet TAKE 2 TABLETS BY MOUTH EVERY EVENING (Patient taking differently: Take 8 mg by mouth every evening.) 180 tablet 2   denosumab (PROLIA) 60 MG/ML SOLN injection Inject 60 mg into the skin every 6 (six) months. Administer in upper arm, thigh, or abdomen     DEXILANT 60 MG capsule TAKE 1 CAPSULE BY MOUTH EACH MORNING (Patient taking differently: Take 60 mg by mouth daily.) 90 capsule 3   diclofenac Sodium (VOLTAREN) 1 % GEL Apply 2 g topically 4 (four) times daily as needed (joint pain). 100 g 6   EPINEPHrine (EPIPEN 2-PAK) 0.3 mg/0.3 mL IJ SOAJ injection Inject 0.3 mLs (0.3 mg total) into the muscle Once PRN. 0.3 mL 2   famotidine (PEPCID) 20 MG tablet TAKE 1 TABLET BY MOUTH TWICE A DAY (Patient taking differently: Take 20 mg by mouth 2 (two) times daily.) 60 tablet 5   fluticasone (FLONASE) 50 MCG/ACT nasal spray USE 2 SPRAYS IN EACH NOSTRIL DAILY 16 g 2   folic acid (FOLVITE) 1 MG tablet Take 1 mg by mouth daily.     furosemide (LASIX) 40 MG tablet Take 1 tablet (40 mg total) by mouth daily. 80 mg in the AM and 40 mg in the PM 120 tablet 3   gabapentin (NEURONTIN) 600 MG tablet TAKE 1 TABLET BY MOUTH DAILY (Patient taking differently: Take 600 mg by mouth at bedtime.) 90 tablet 3   guaiFENesin (MUCINEX) 600 MG 12 hr tablet Take 1 tablet (600 mg total) by mouth 2 (two) times daily as  needed for cough or to loosen phlegm. 60 tablet 1   hydrocortisone 2.5 % cream  Apply topically 2 (two) times daily as needed (hemorrhoids). 28 g 3   Lactase 9000 units TABS Take 18,000-27,000 Units by mouth 4 (four) times daily as needed (dairy consumption).     lidocaine (LIDODERM) 5 % Place 1 patch onto the skin daily as needed (pain). Remove & Discard patch within 12 hours or as directed by MD     magnesium oxide (MAG-OX) 400 MG tablet Take 400 mg by mouth daily.     mirtazapine (REMERON SOL-TAB) 30 MG disintegrating tablet DISSOLVE 1 TABLET BY MOUTH EVERY NIGHT AT BEDTIME 90 tablet 1   MITIGARE 0.6 MG CAPS Take 0.6 mg by mouth every other day.     montelukast (SINGULAIR) 10 MG tablet TAKE 1 TABLET BY MOUTH DAILY 30 tablet 5   mupirocin ointment (BACTROBAN) 2 % APPLY TO BED SORES AS NEEDED 22 g 0   Nystatin (GERHARDT'S BUTT CREAM) CREA Apply 1 application topically daily as needed (hemorrhoids). NYSTATIN 100,000 UNIT/GM CR 100000 CRM- 60 g, ZINC OXIDE 20 % OINT 20 OINT- 60 g, HYDROCORTISONE 1 % CREAM- 60 g 1 each 11   nystatin (MYCOSTATIN) 100000 UNIT/ML suspension SWISH AND SPIT 5 MLS TWICE DAILY (Patient taking differently: Use as directed 5 mLs in the mouth or throat at bedtime.) 473 mL 3   nystatin cream (MYCOSTATIN) Apply 1 application topically 2 (two) times daily.     nystatin-triamcinolone ointment (MYCOLOG) Apply topically 2 (two) times daily. (Patient taking differently: Apply 1 application topically 2 (two) times daily as needed (yeast infections).) 90 g 1   ondansetron (ZOFRAN) 4 MG tablet Take 1 tablet (4 mg total) by mouth every 8 (eight) hours as needed for nausea or vomiting. 10 tablet 0   potassium chloride SA (KLOR-CON) 20 MEQ tablet Take 1 tablet (20 mEq total) by mouth daily. 30 tablet 3   predniSONE (DELTASONE) 5 MG tablet Take 15 mg by mouth daily with breakfast.     Prenatal Vit-Fe Fumarate-FA (PRENATAL MULTIVITAMIN) TABS tablet Take 1 tablet by mouth daily.     Probiotic Product (ALIGN PO) Take 1 capsule by mouth daily.     Propylene Glycol (SYSTANE  COMPLETE) 0.6 % SOLN Place 1 drop into both eyes 2 (two) times daily as needed (dry eyes).     Respiratory Therapy Supplies (FLUTTER) DEVI Use as directed 1 each 0   Salicylic Acid-Urea (KERASAL EX) Apply 1 application topically at bedtime.     sodium chloride HYPERTONIC 3 % nebulizer solution Take by nebulization 2 (two) times daily. 750 mL 12   Spacer/Aero-Holding Chambers (AEROCHAMBER PLUS) inhaler Use as instructed 1 each 0   Tiotropium Bromide-Olodaterol (STIOLTO RESPIMAT) 2.5-2.5 MCG/ACT AERS Inhale 2 puffs into the lungs daily. 4 g 5   XARELTO 20 MG TABS tablet TAKE 1 TABLET BY MOUTH DAILY WITH SUPPER (Patient taking differently: Take 20 mg by mouth every evening.) 90 tablet 3   metolazone (ZAROXOLYN) 5 MG tablet Take 1 tablet (5 mg total) by mouth daily. (Patient not taking: Reported on 09/06/2020) 30 tablet 0   Current Facility-Administered Medications on File Prior to Visit  Medication Dose Route Frequency Provider Last Rate Last Admin   denosumab (PROLIA) injection 60 mg  60 mg Subcutaneous Q6 months [name], MD   60 mg at 03/31/20 1224   Allergies  Allergen Reactions   Dust Mite Extract Shortness Of Breath and Other (See Comments)    "  sneezing" (02/20/2012)   Molds & Smuts Shortness Of Breath   Morphine And Related Hives and Itching   Other Shortness Of Breath and Other (See Comments)    Grass and weeds "sneezing; filled sinuses" (02/20/2012)   Penicillins Rash and Other (See Comments)    "welts" (02/20/2012) Has patient had a PCN reaction causing immediate rash, facial/tongue/throat swelling, SOB or lightheadedness with hypotension: Unknown Has patient had a PCN reaction causing severe rash involving mucus membranes or skin necrosis: No Has patient had a PCN reaction that required hospitalization: No Has patient had a PCN reaction occurring within the last 10 years: No If all of the above answers are "NO", then may proceed with Cephalosporin use. Tolerated Ancef  02/16/20.    Reclast [Zoledronic Acid] Other (See Comments)    Fever, Put in hospital, dr said it was a reaction from a reaction    Rofecoxib Swelling    Vioxx REACTION: feet swelling   Shellfish Allergy Anaphylaxis, Shortness Of Breath, Itching and Rash   Tetracycline Hcl Nausea And Vomiting   Xolair [Omalizumab] Other (See Comments)    Caused Blood clot   Dilaudid [Hydromorphone Hcl] Itching   Hydrocodone-Acetaminophen Nausea And Vomiting   Levofloxacin Other (See Comments)    GI upset   Oxycodone Hcl Nausea And Vomiting   Paroxetine Nausea And Vomiting    Paxil    Baclofen Other (See Comments)    Altered mental status requiring 3 day hospital stay    Celecoxib Swelling    Feet swelling   Diltiazem Swelling   Lactose Intolerance (Gi) Other (See Comments)    Bloating and gas   Tree Extract Other (See Comments)    "tested and told I was allergic to it; never experienced a reaction to it" (02/20/2012)   Social History   Socioeconomic History   Marital status: Divorced    Spouse name: Not on file   Number of children: 2   Years of education: college   Highest education level: Not on file  Occupational History   Occupation: Disabled    Comment: Retired Engineer, production: RETIRED  Tobacco Use   Smoking status: Never    Passive exposure: Yes   Smokeless tobacco: Never   Tobacco comments:    Parents  Vaping Use   Vaping Use: Never used  Substance and Sexual Activity   Alcohol use: Not Currently    Alcohol/week: 0.0 standard drinks   Drug use: No   Sexual activity: Never  Other Topics Concern   Not on file  Social History Narrative   Patient lives at home alone. Patient  divorced.    Patient has her BS degree.   Right handed.   Caffeine- sometimes coffee.      [facility]:   Born in [place], [place]. She worked as a Copywriter, advertising. She has no pets currently. She does have indoor plants. Previously had mold in her home that was  remediated. Carpet was removed.          Social Determinants of Health   Financial Resource Strain: Low Risk    Difficulty of Paying Living Expenses: Not hard at all  Food Insecurity: No Food Insecurity   Worried About Charity fundraiser in the Last Year: Never true   [place] in the Last Year: Never true  Transportation Needs: No Transportation Needs   Lack of Transportation (Medical): No   Lack of Transportation (Non-Medical): No  Physical Activity: Inactive  Days of Exercise per Week: 0 days   Minutes of Exercise per Session: 0 min  Stress: No Stress Concern Present   Feeling of Stress : Not at all  Social Connections: Moderately Isolated   Frequency of Communication with Friends and Family: More than three times a week   Frequency of Social Gatherings with Friends and Family: More than three times a week   Attends Religious Services: Never   Marine scientist or Organizations: Yes   Attends Archivist Meetings: Never   Marital Status: Divorced  Human resources officer Violence: Not At Risk   Fear of Current or Ex-Partner: No   Emotionally Abused: No   Physically Abused: No   Sexually Abused: No     Review of Systems     Objective:   Physical Exam Vitals reviewed.  Constitutional:      General: She is not in acute distress.    Appearance: Normal appearance. She is not ill-appearing or toxic-appearing.  HENT:     Right Ear: Tympanic membrane and ear canal normal.     Left Ear: Tympanic membrane and ear canal normal.     Nose: No congestion or rhinorrhea.  Cardiovascular:     Rate and Rhythm: Normal rate and regular rhythm.     Heart sounds: Normal heart sounds.  Pulmonary:     Effort: Pulmonary effort is normal. No respiratory distress.     Breath sounds: No stridor or decreased air movement. Wheezing present. No rales.  Abdominal:     General: Bowel sounds are normal. There is no distension.     Palpations: Abdomen is soft.     Tenderness:  There is no abdominal tenderness. There is no guarding.  Musculoskeletal:     Right lower leg: Edema present.     Left lower leg: Edema present.  Skin:    Findings: No erythema.  Neurological:     Mental Status: She is alert and oriented to person, place, and time.     Cranial Nerves: No cranial nerve deficit.     Sensory: No sensory deficit.     Motor: No weakness.     Coordination: Coordination normal.  Moon facies, buffalo hump       Assessment & Plan:  Acute diastolic heart failure ([place]) - Plan: COMPLETE METABOLIC PANEL WITH GFR After an extensive talk with the patient, I realize she is drinking Gatorade on a daily basis 2 or 3 bottles a day.  I believe that the sodium intake is likely contributing to the increased swelling.  Therefore I recommended that she discontinue Gatorade and reduce her sodium consumption.  Leave her Lasix and potassium dose alone and check her renal function.  If renal function and potassium are stable I will continue this dose of diuretic and await the results of her echocardiogram.  I believe that the majority of her issues with her breathing today related to bronchospasm and wheezing.  She is currently doing Stiolto in addition to Spiriva?.  I recommended that she contact her pulmonologist and verify that this is in fact their intention.  However I would suggest possibly starting Trelegy 1 inhalation a day in place of Stiolto to see if this would help with some of the wheezing and bronchospasm without forcing [place] to increase her dose of prednisone further.  I recommended that she discuss this with her pulmonologist.  Await the results of the echocardiogram

## 2020-09-08 ENCOUNTER — Telehealth: Payer: Self-pay | Admitting: Family Medicine

## 2020-09-08 ENCOUNTER — Other Ambulatory Visit: Payer: Self-pay | Admitting: *Deleted

## 2020-09-08 MED ORDER — BLOOD GLUCOSE SYSTEM PAK KIT: PACK | 0 refills | Status: DC

## 2020-09-08 MED ORDER — BLOOD GLUCOSE TEST VI STRP: ORAL_STRIP | 1 refills | Status: DC

## 2020-09-08 MED ORDER — LANCETS MISC: 1 refills | Status: DC

## 2020-09-08 NOTE — Telephone Encounter (Signed)
Pt called in wanted to know if her lab results were back and wanted to discuss them. Please advise  Cb#: [phone]

## 2020-09-09 ENCOUNTER — Encounter: Payer: Self-pay | Admitting: *Deleted

## 2020-09-09 ENCOUNTER — Encounter: Payer: Self-pay | Admitting: Podiatry

## 2020-09-09 ENCOUNTER — Ambulatory Visit: Payer: Medicare PPO | Admitting: Podiatry

## 2020-09-09 ENCOUNTER — Other Ambulatory Visit: Payer: Self-pay

## 2020-09-09 DIAGNOSIS — H938X3 Other specified disorders of ear, bilateral: Secondary | ICD-10-CM | POA: Diagnosis not present

## 2020-09-09 DIAGNOSIS — Z9889 Other specified postprocedural states: Secondary | ICD-10-CM

## 2020-09-09 DIAGNOSIS — M1712 Unilateral primary osteoarthritis, left knee: Secondary | ICD-10-CM | POA: Diagnosis not present

## 2020-09-09 DIAGNOSIS — L299 Pruritus, unspecified: Secondary | ICD-10-CM | POA: Diagnosis not present

## 2020-09-09 DIAGNOSIS — L97512 Non-pressure chronic ulcer of other part of right foot with fat layer exposed: Secondary | ICD-10-CM

## 2020-09-09 DIAGNOSIS — R490 Dysphonia: Secondary | ICD-10-CM | POA: Insufficient documentation

## 2020-09-09 DIAGNOSIS — J449 Chronic obstructive pulmonary disease, unspecified: Secondary | ICD-10-CM | POA: Diagnosis not present

## 2020-09-09 DIAGNOSIS — K219 Gastro-esophageal reflux disease without esophagitis: Secondary | ICD-10-CM | POA: Diagnosis not present

## 2020-09-09 DIAGNOSIS — M67441 Ganglion, right hand: Secondary | ICD-10-CM | POA: Diagnosis not present

## 2020-09-09 DIAGNOSIS — J3489 Other specified disorders of nose and nasal sinuses: Secondary | ICD-10-CM | POA: Diagnosis not present

## 2020-09-09 NOTE — Telephone Encounter (Signed)
Please see lab results for further information.  

## 2020-09-09 NOTE — Progress Notes (Signed)
Subjective:  Patient ID: Ebony Scott, female    DOB: 1951/01/11,  MRN: [id number]  Chief Complaint  Patient presents with   Routine Post Op    POST OP DOS 6.3.22    DOS: 07/22/2020 Procedure: Right second digit internal amputation  70 y.o. female returns for post-op check.  Patient states she is doing well.  She denies any other acute complaints she would like to discuss next treatment options.  She wants to make sure that is healing okay.  Review of Systems: Negative except as noted in the HPI. Denies N/V/F/Ch.  Past Medical History:  Diagnosis Date   Acute deep vein thrombosis (DVT) of right lower extremity (HCC) 12/13/2017   Allergic rhinitis    Allergy    SEASONAL   Anemia    Angio-edema    Anxiety    pt denies   Arthritis    Phreesia 10/21/2019   Asthma    Asthma    Phreesia 10/21/2019   Breast cancer ([place]) 1998   in remission   Cataract    REMOVED   Clostridium difficile colitis 01/14/2018   Clostridium difficile diarrhea 27/25/3664   Complication of anesthesia    "had hard time waking up from it several times" (02/20/2012)   Depression    "some; don't take anything for it" (02/20/2012)   Diverticulosis    DVT (deep venous thrombosis) (HCC)    Exertional dyspnea    Fibromyalgia 11/2011   GERD (gastroesophageal reflux disease)    Graves disease    Headache(784.0)    "related to allergies; more at different times during the year" (02/20/2012)   Hemorrhoids    Hiatal hernia    back and neck   Hx of adenomatous colonic polyps 04/12/2016   Hypercholesteremia    good cholesterol is high   Hypothyroidism    IBS (irritable bowel syndrome)    Moderate persistent asthma    -FeV1 72% 2011, -IgE 102 2011, CT sinus Neg 2011   Osteoporosis    on reclast yearly   Peripheral vascular disease ([place]) 2019   DVTs   Pneumonia 04/2011; ~ 11/2011   "double; single" (02/20/2012)   Recurrent upper respiratory infection (URI)    Seronegative rheumatoid arthritis ([place])    Dr.  Lahoma Rocker   SIRS (systemic inflammatory response syndrome) ([place]) 02/10/2018   Tracheobronchomalacia     Current Outpatient Medications:    acetaminophen (TYLENOL) 650 MG CR tablet, Take 1,300 mg by mouth every 8 (eight) hours as needed for pain., Disp: , Rfl:    albuterol (PROVENTIL) (2.5 MG/3ML) 0.083% nebulizer solution, USE 1 AMPULE IN NEBULIZER IN AM AT BEDTIME ALONG WITH FLUTTER VALVE, Disp: 180 mL, Rfl: 5   albuterol (VENTOLIN HFA) 108 (90 Base) MCG/ACT inhaler, INHALE 2 PUFFS BY MOUTH INTO THE LUNGS EVERY 4 HOURS AS NEEDED FOR WHEEZING OR SHORTNESS OF BREATH, Disp: 8.5 g, Rfl: 3   allopurinol (ZYLOPRIM) 100 MG tablet, Take 100 mg by mouth daily., Disp: , Rfl:    Alpha-D-Galactosidase (BEANO PO), Take 2-3 capsules by mouth 3 (three) times daily with meals., Disp: , Rfl:    Alpha-Lipoic Acid 600 MG CAPS, Take 600 mg by mouth daily. , Disp: , Rfl:    ammonium lactate (AMLACTIN) 12 % cream, Apply topically as needed for dry skin. (Patient taking differently: Apply 1 g topically at bedtime.), Disp: 385 g, Rfl: 2   atorvastatin (LIPITOR) 10 MG tablet, TAKE 1 TABLET BY MOUTH DAILY, Disp: 90 tablet, Rfl: 3   azelastine (  ASTELIN) 0.1 % nasal spray, USE 2 SPRAYS IN EACH NOSTRIL DAILY AS DIRECTED (Patient taking differently: Place 2 sprays into both nostrils daily as needed for allergies.), Disp: 30 mL, Rfl: 5   benzonatate (TESSALON) 200 MG capsule, TAKE 1 CAPSULE BY MOUTH TWICE DAILY AS NEEDED FOR COUGH, Disp: 20 capsule, Rfl: 1   Blood Glucose Monitoring Suppl (BLOOD GLUCOSE SYSTEM PAK) KIT, Please dispense per patient and insurance preference. Use as directed to monitor FSBS 4x . Dx: E11.65, Disp: 1 kit, Rfl: 0   Calcium Citrate (CITRACAL PO), Take 1 tablet by mouth in the morning and at bedtime., Disp: , Rfl:    CALCIUM-MAGNESIUM PO, Take 1 tablet by mouth 2 (two) times daily., Disp: , Rfl:    cetirizine (ZYRTEC) 10 MG tablet, Take 10 mg by mouth daily., Disp: , Rfl:    cholecalciferol  (VITAMIN D) 25 MCG (1000 UT) tablet, Take 1,000 Units by mouth daily. , Disp: , Rfl:    COVID-19 mRNA vaccine, Pfizer, 30 MCG/0.3ML injection, INJECT AS DIRECTED, Disp: .3 mL, Rfl: 0   cyproheptadine (PERIACTIN) 4 MG tablet, TAKE 2 TABLETS BY MOUTH EVERY EVENING (Patient taking differently: Take 8 mg by mouth every evening.), Disp: 180 tablet, Rfl: 2   denosumab (PROLIA) 60 MG/ML SOLN injection, Inject 60 mg into the skin every 6 (six) months. Administer in upper arm, thigh, or abdomen, Disp: , Rfl:    DEXILANT 60 MG capsule, TAKE 1 CAPSULE BY MOUTH EACH MORNING (Patient taking differently: Take 60 mg by mouth daily.), Disp: 90 capsule, Rfl: 3   diclofenac Sodium (VOLTAREN) 1 % GEL, Apply 2 g topically 4 (four) times daily as needed (joint pain)., Disp: 100 g, Rfl: 6   EPINEPHrine (EPIPEN 2-PAK) 0.3 mg/0.3 mL IJ SOAJ injection, Inject 0.3 mLs (0.3 mg total) into the muscle Once PRN., Disp: 0.3 mL, Rfl: 2   famotidine (PEPCID) 20 MG tablet, TAKE 1 TABLET BY MOUTH TWICE A DAY (Patient taking differently: Take 20 mg by mouth 2 (two) times daily.), Disp: 60 tablet, Rfl: 5   fluticasone (FLONASE) 50 MCG/ACT nasal spray, USE 2 SPRAYS IN EACH NOSTRIL DAILY, Disp: 16 g, Rfl: 2   folic acid (FOLVITE) 1 MG tablet, Take 1 mg by mouth daily., Disp: , Rfl:    furosemide (LASIX) 40 MG tablet, Take 1 tablet (40 mg total) by mouth daily. 80 mg in the AM and 40 mg in the PM, Disp: 120 tablet, Rfl: 3   gabapentin (NEURONTIN) 600 MG tablet, TAKE 1 TABLET BY MOUTH DAILY (Patient taking differently: Take 600 mg by mouth at bedtime.), Disp: 90 tablet, Rfl: 3   Glucose Blood (BLOOD GLUCOSE TEST STRIPS) STRP, Please dispense per patient and insurance preference. Use as directed to monitor FSBS 4x . Dx: E11.65, Disp: 150 strip, Rfl: 1   guaiFENesin (MUCINEX) 600 MG 12 hr tablet, Take 1 tablet (600 mg total) by mouth 2 (two) times daily as needed for cough or to loosen phlegm., Disp: 60 tablet, Rfl: 1   hydrocortisone 2.5 %  cream, Apply topically 2 (two) times daily as needed (hemorrhoids)., Disp: 28 g, Rfl: 3   Lactase 9000 units TABS, Take 18,000-27,000 Units by mouth 4 (four) times daily as needed (dairy consumption)., Disp: , Rfl:    Lancets MISC, Please dispense per patient and insurance preference. Use as directed to monitor FSBS 4x . Dx: E11.65, Disp: 150 each, Rfl: 1   lidocaine (LIDODERM) 5 %, Place 1 patch onto the skin daily as needed (  pain). Remove & Discard patch within 12 hours or as directed by MD, Disp: , Rfl:    magnesium oxide (MAG-OX) 400 MG tablet, Take 400 mg by mouth daily., Disp: , Rfl:    metolazone (ZAROXOLYN) 5 MG tablet, Take 1 tablet (5 mg total) by mouth daily. (Patient not taking: Reported on 09/06/2020), Disp: 30 tablet, Rfl: 0   mirtazapine (REMERON SOL-TAB) 30 MG disintegrating tablet, DISSOLVE 1 TABLET BY MOUTH EVERY NIGHT AT BEDTIME, Disp: 90 tablet, Rfl: 1   MITIGARE 0.6 MG CAPS, Take 0.6 mg by mouth every other day., Disp: , Rfl:    montelukast (SINGULAIR) 10 MG tablet, TAKE 1 TABLET BY MOUTH DAILY, Disp: 30 tablet, Rfl: 5   mupirocin ointment (BACTROBAN) 2 %, APPLY TO BED SORES AS NEEDED, Disp: 22 g, Rfl: 0   Nystatin (GERHARDT'S BUTT CREAM) CREA, Apply 1 application topically daily as needed (hemorrhoids). NYSTATIN 100,000 UNIT/GM CR 100000 CRM- 60 g, ZINC OXIDE 20 % OINT 20 OINT- 60 g, HYDROCORTISONE 1 % CREAM- 60 g, Disp: 1 each, Rfl: 11   nystatin (MYCOSTATIN) 100000 UNIT/ML suspension, SWISH AND SPIT 5 MLS TWICE DAILY (Patient taking differently: Use as directed 5 mLs in the mouth or throat at bedtime.), Disp: 473 mL, Rfl: 3   nystatin cream (MYCOSTATIN), Apply 1 application topically 2 (two) times daily., Disp: , Rfl:    nystatin-triamcinolone ointment (MYCOLOG), Apply topically 2 (two) times daily. (Patient taking differently: Apply 1 application topically 2 (two) times daily as needed (yeast infections).), Disp: 90 g, Rfl: 1   ondansetron (ZOFRAN) 4 MG tablet, Take 1 tablet (4  mg total) by mouth every 8 (eight) hours as needed for nausea or vomiting., Disp: 10 tablet, Rfl: 0   potassium chloride SA (KLOR-CON) 20 MEQ tablet, Take 1 tablet (20 mEq total) by mouth daily., Disp: 30 tablet, Rfl: 3   predniSONE (DELTASONE) 5 MG tablet, Take 15 mg by mouth daily with breakfast., Disp: , Rfl:    Prenatal Vit-Fe Fumarate-FA (PRENATAL MULTIVITAMIN) TABS tablet, Take 1 tablet by mouth daily., Disp: , Rfl:    Probiotic Product (ALIGN PO), Take 1 capsule by mouth daily., Disp: , Rfl:    Propylene Glycol (SYSTANE COMPLETE) 0.6 % SOLN, Place 1 drop into both eyes 2 (two) times daily as needed (dry eyes)., Disp: , Rfl:    Respiratory Therapy Supplies (FLUTTER) DEVI, Use as directed, Disp: 1 each, Rfl: 0   Salicylic Acid-Urea (KERASAL EX), Apply 1 application topically at bedtime., Disp: , Rfl:    sodium chloride HYPERTONIC 3 % nebulizer solution, Take by nebulization 2 (two) times daily., Disp: 750 mL, Rfl: 12   Spacer/Aero-Holding Chambers (AEROCHAMBER PLUS) inhaler, Use as instructed, Disp: 1 each, Rfl: 0   Tiotropium Bromide-Olodaterol (STIOLTO RESPIMAT) 2.5-2.5 MCG/ACT AERS, Inhale 2 puffs into the lungs daily., Disp: 4 g, Rfl: 5   XARELTO 20 MG TABS tablet, TAKE 1 TABLET BY MOUTH DAILY WITH SUPPER (Patient taking differently: Take 20 mg by mouth every evening.), Disp: 90 tablet, Rfl: 3  Current Facility-Administered Medications:    denosumab (PROLIA) injection 60 mg, 60 mg, Subcutaneous, Q6 months, [name], Warren T, MD, 60 mg at 03/31/20 1224  Social History   Tobacco Use  Smoking Status Never   Passive exposure: Yes  Smokeless Tobacco Never  Tobacco Comments   Parents    Allergies  Allergen Reactions   Dust Mite Extract Shortness Of Breath and Other (See Comments)    "sneezing" (02/20/2012)   Molds & Smuts Shortness Of  Breath   Morphine And Related Hives and Itching   Other Shortness Of Breath and Other (See Comments)    Grass and weeds "sneezing; filled sinuses"  (02/20/2012)   Penicillins Rash and Other (See Comments)    "welts" (02/20/2012) Has patient had a PCN reaction causing immediate rash, facial/tongue/throat swelling, SOB or lightheadedness with hypotension: Unknown Has patient had a PCN reaction causing severe rash involving mucus membranes or skin necrosis: No Has patient had a PCN reaction that required hospitalization: No Has patient had a PCN reaction occurring within the last 10 years: No If all of the above answers are "NO", then may proceed with Cephalosporin use. Tolerated Ancef 02/16/20.    Reclast [Zoledronic Acid] Other (See Comments)    Fever, Put in hospital, dr said it was a reaction from a reaction    Rofecoxib Swelling    Vioxx REACTION: feet swelling   Shellfish Allergy Anaphylaxis, Shortness Of Breath, Itching and Rash   Tetracycline Hcl Nausea And Vomiting   Xolair [Omalizumab] Other (See Comments)    Caused Blood clot   Dilaudid [Hydromorphone Hcl] Itching   Hydrocodone-Acetaminophen Nausea And Vomiting   Levofloxacin Other (See Comments)    GI upset   Oxycodone Hcl Nausea And Vomiting   Paroxetine Nausea And Vomiting    Paxil    Baclofen Other (See Comments)    Altered mental status requiring 3 day hospital stay    Celecoxib Swelling    Feet swelling   Diltiazem Swelling   Lactose Intolerance (Gi) Other (See Comments)    Bloating and gas   Tree Extract Other (See Comments)    "tested and told I was allergic to it; never experienced a reaction to it" (02/20/2012)   Objective:  There were no vitals filed for this visit. There is no height or weight on file to calculate BMI. Constitutional Well developed. Well nourished.  Vascular Foot warm and well perfused. Capillary refill normal to all digits.   Neurologic Normal speech. Oriented to person, place, and time. Epicritic sensation to light touch grossly present bilaterally.  Dermatologic Superficial dehiscence noted.  Does not probe down to deep bone.   Orthopedic: Mild tenderness to palpation noted about the surgical site.   Radiographs: 3 views of skeletally mature adult right foot: Arthroplasty noted at the head of the second metatarsal phalangeal joint. Assessment:   1. Post-operative state   2. Toe ulcer, right, with fat layer exposed ([place])      Plan:  Patient was evaluated and treated and all questions answered.  S/p foot surgery right -Progressing as expected post-operatively. -XR: See above -WB Status: Weightbearing as tolerated in surgical shoe -Sutures: Removed.  Mild dehiscence noted.  No complication noted. -Medications: None -Continue Betadine wet-to-dry dressing changes.  Right second digit ulceration with fat layer expose -Debridement as below. -Dressed with Betadine wet-to-dry, DSD. -Continue off-loading with surgical shoe.  Procedure: Excisional Debridement of Wound Tool: Sharp chisel blade/tissue nipper Rationale: Removal of non-viable soft tissue from the wound to promote healing.  Anesthesia: none Pre-Debridement Wound Measurements: 0.3 cm x 0.2 cm x 0.3 cm  Post-Debridement Wound Measurements: 0.4 cm x 0.2 cm x 0.3 cm  Type of Debridement: Sharp Excisional Tissue Removed: Non-viable soft tissue Blood loss: Minimal (<50cc) Depth of Debridement: subcutaneous tissue. Technique: Sharp excisional debridement to bleeding, viable wound base.  Wound Progress: The wound is decreasing the depth is about the same. Site healing conversation 7 Dressing: Dry, sterile, compression dressing. Disposition: Patient tolerated procedure well. Patient  to return in 1 week for follow-up.  No follow-ups on file.      No follow-ups on file.

## 2020-09-12 NOTE — Telephone Encounter (Signed)
She has had issues with inhaled steroids with the past with hoarseness in her voice and pharyngeal/esophageal candidiasis.   We have been trying to avoid inhaled steroids due to these previous issues. If she would like to try the trelegy inhaler for 1-2 weeks and then transition back to stiolto that would be an option but I do not recommend she go back on inhaled steroids long term.   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   Thanks, Wille Glaser

## 2020-09-13 DIAGNOSIS — I5032 Chronic diastolic (congestive) heart failure: Secondary | ICD-10-CM | POA: Diagnosis not present

## 2020-09-13 DIAGNOSIS — J4540 Moderate persistent asthma, uncomplicated: Secondary | ICD-10-CM | POA: Diagnosis not present

## 2020-09-13 DIAGNOSIS — M0600 Rheumatoid arthritis without rheumatoid factor, unspecified site: Secondary | ICD-10-CM | POA: Diagnosis not present

## 2020-09-13 DIAGNOSIS — G4733 Obstructive sleep apnea (adult) (pediatric): Secondary | ICD-10-CM | POA: Diagnosis not present

## 2020-09-13 DIAGNOSIS — G4736 Sleep related hypoventilation in conditions classified elsewhere: Secondary | ICD-10-CM | POA: Diagnosis not present

## 2020-09-13 DIAGNOSIS — G894 Chronic pain syndrome: Secondary | ICD-10-CM | POA: Diagnosis not present

## 2020-09-13 DIAGNOSIS — J471 Bronchiectasis with (acute) exacerbation: Secondary | ICD-10-CM | POA: Diagnosis not present

## 2020-09-13 DIAGNOSIS — J45909 Unspecified asthma, uncomplicated: Secondary | ICD-10-CM | POA: Diagnosis not present

## 2020-09-13 DIAGNOSIS — R0602 Shortness of breath: Secondary | ICD-10-CM | POA: Diagnosis not present

## 2020-09-13 NOTE — Telephone Encounter (Signed)
Called and spoke to pt. Informed her of the recs per Dr. Erin Fulling. Pt was appreciative of the recommendation and the insight. Pt has chosen not to try the trelegy and has decided to stay on her current therapy.   Will forward to Dr. Erin Fulling as Juluis Rainier.

## 2020-09-14 IMAGING — US US PELVIS LIMITED
1 series · 14 of 18 positions shown · non-contrast
Comparison: None.

CLINICAL DATA: Vaginal bleeding.

EXAM:
TRANSABDOMINAL ULTRASOUND OF PELVIS
TECHNIQUE: Transabdominal ultrasound examination of the pelvis was performed
including evaluation of the uterus, ovaries, adnexal regions, and
pelvic cul-de-sac.

[Series 1: us pelvis limited · 0.20mm/px · 18 acquisitions, 14 frames shown]
[im 1/18]
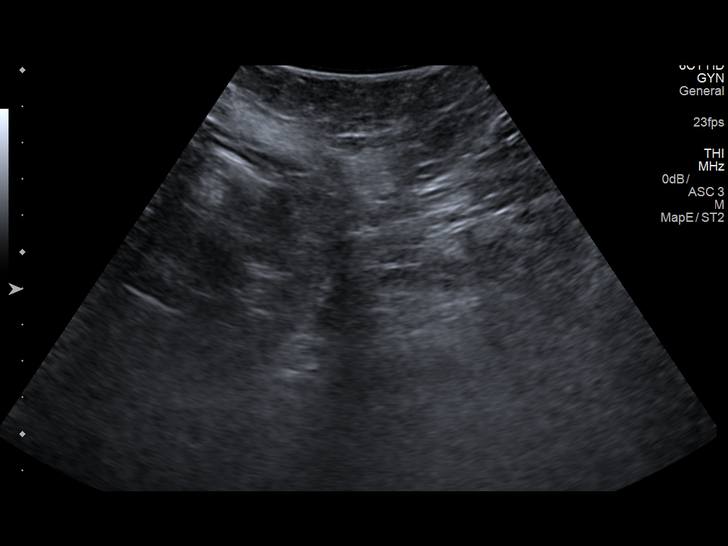
[im 2/18]
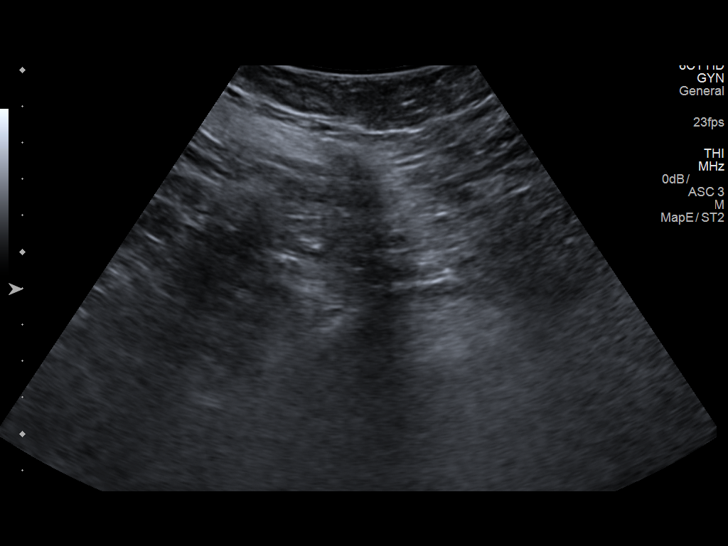
[im 4/18]
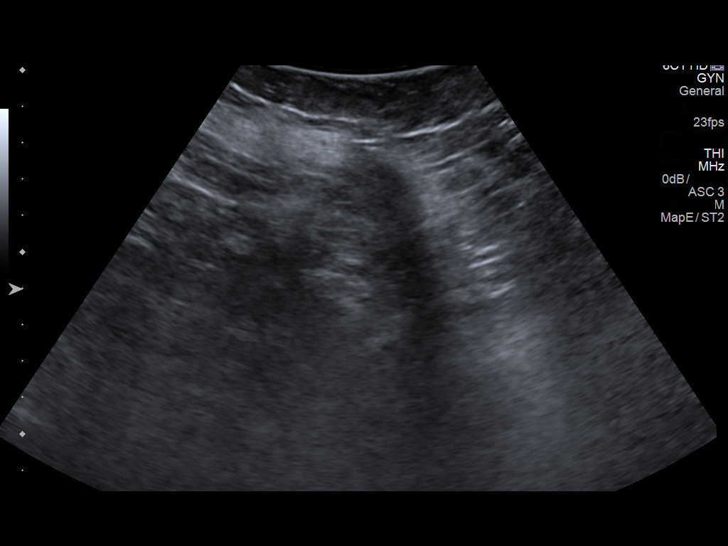
[im 5/18]
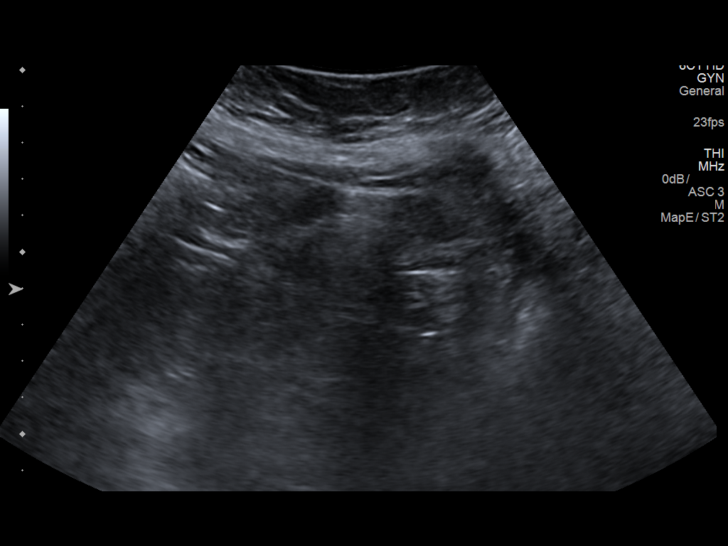
[im 6/18]
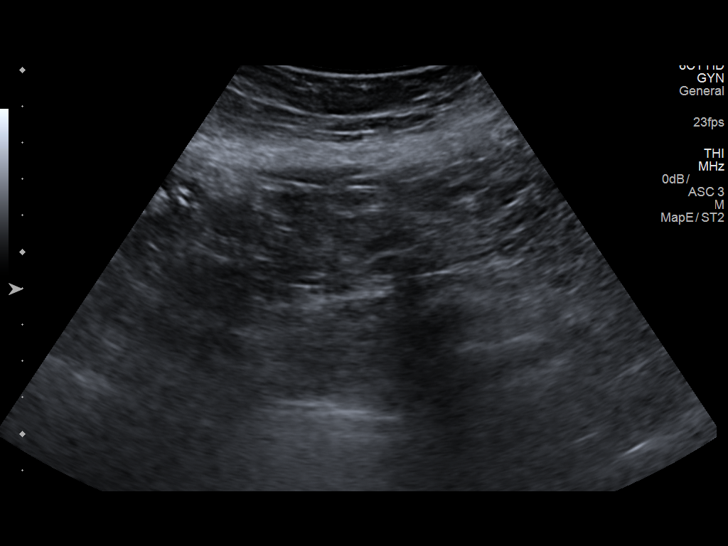
[im 8/18]
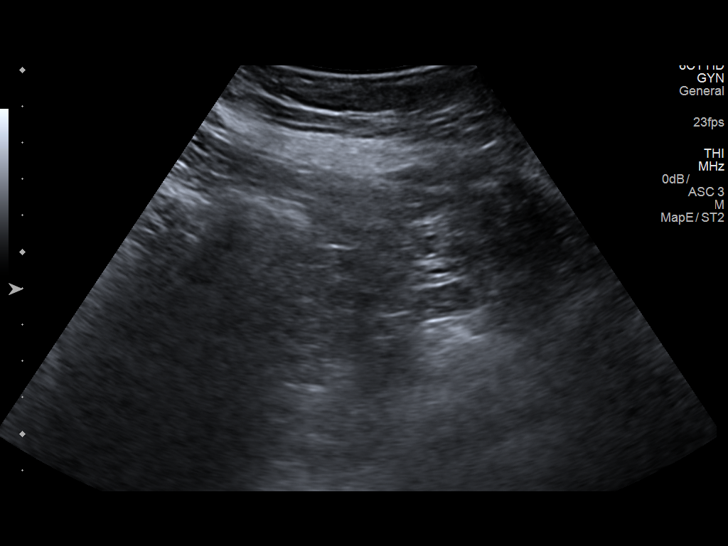
[im 9/18]
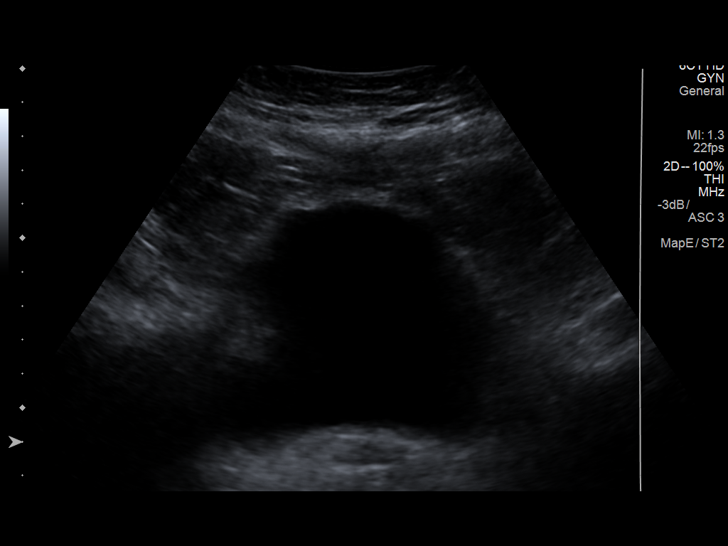
[im 10/18]
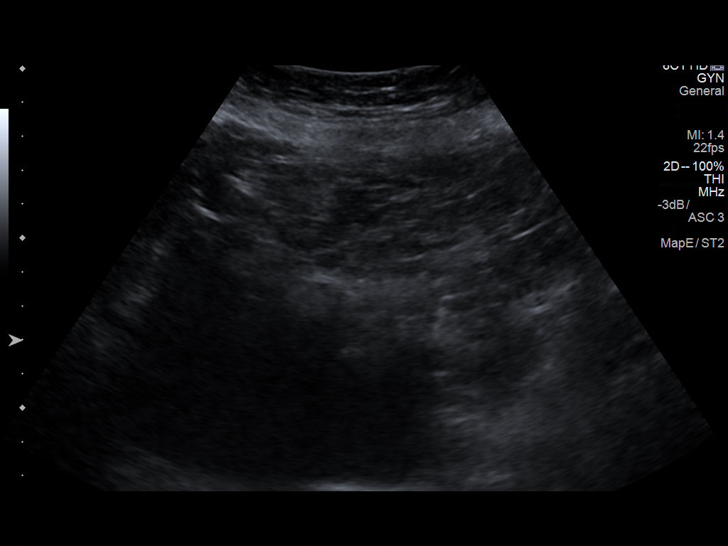
[im 11/18]
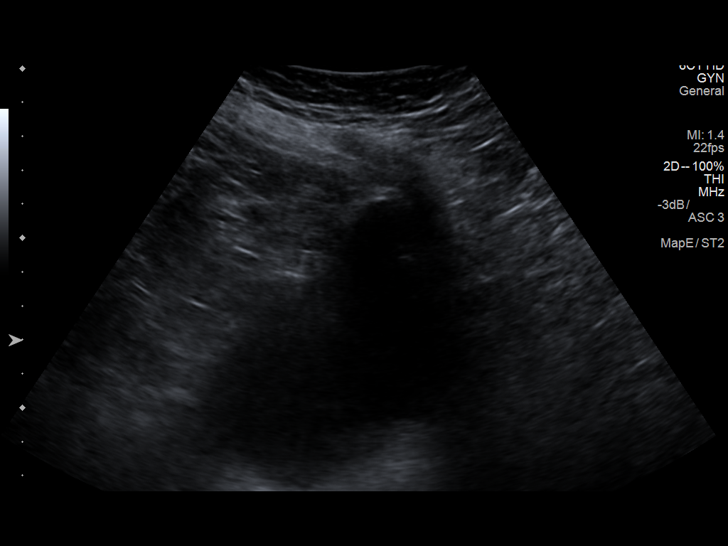
[im 13/18]
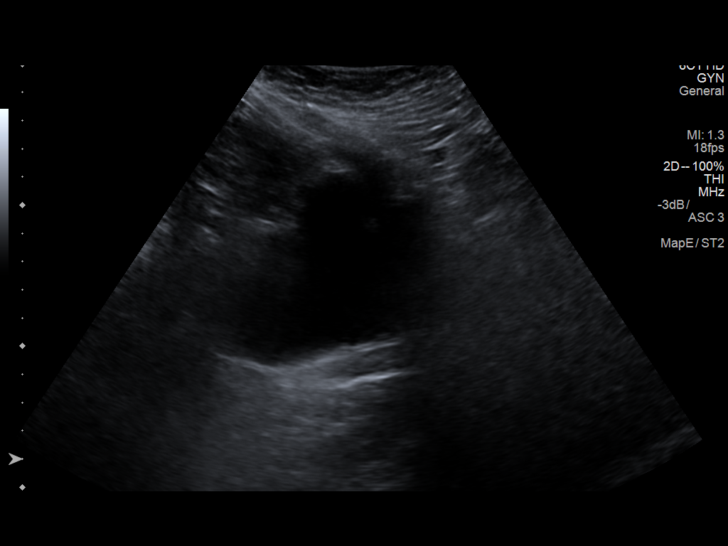
[im 14/18]
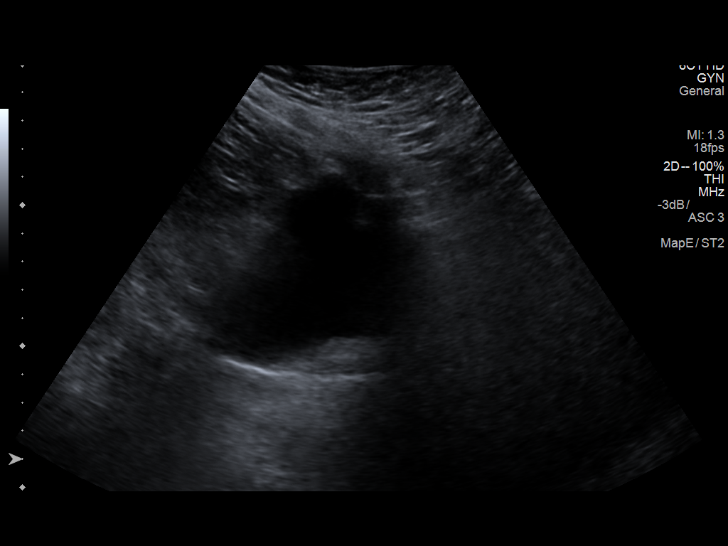
[im 15/18]
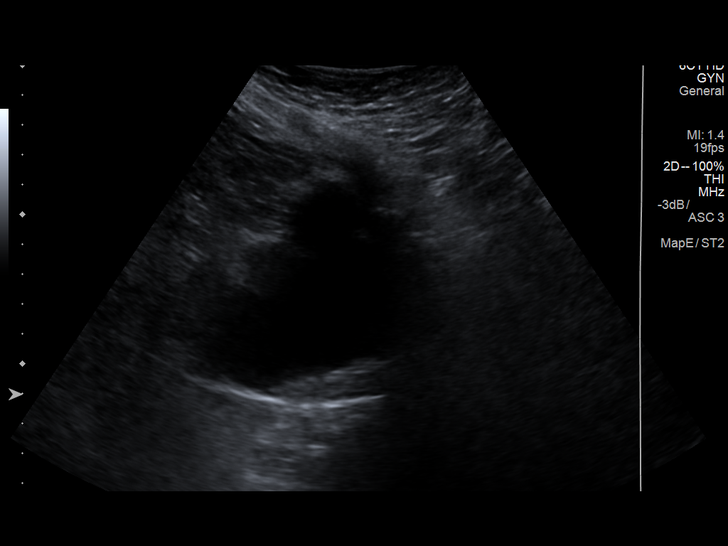
[im 17/18]
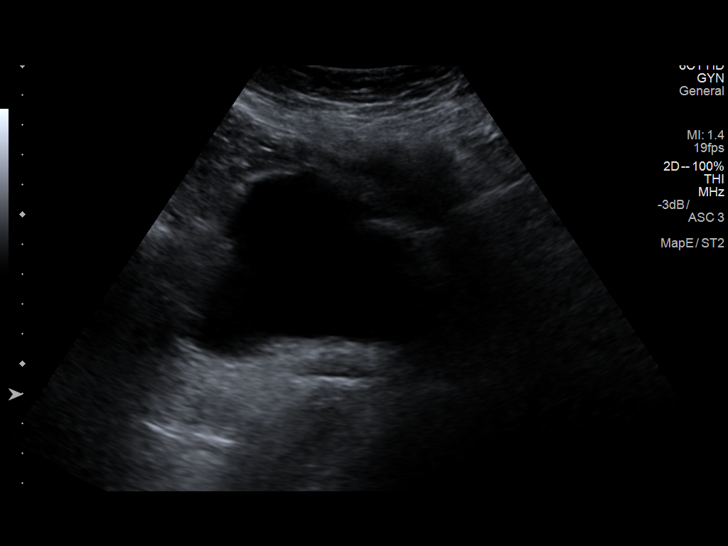
[im 18/18]
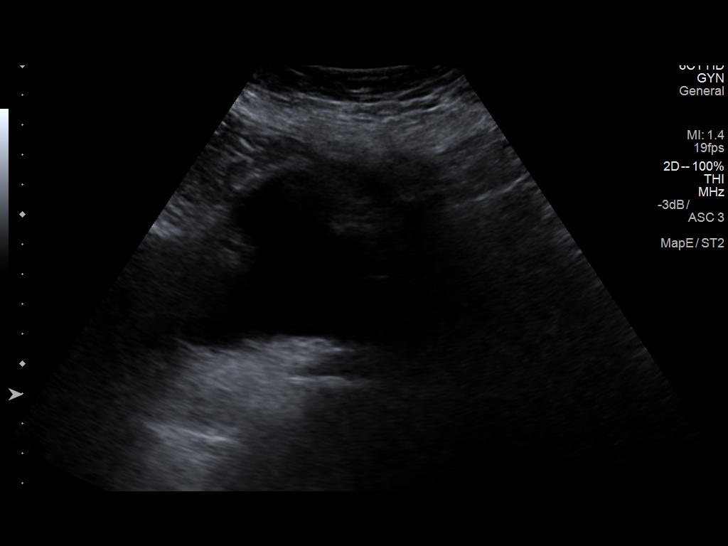

[14 of 18 positions shown; findings below may reference images not displayed]

FINDINGS: Uterus

Previous hysterectomy.

Endometrium

Previous hysterectomy.

Right ovary

Not visualized.

Left ovary

Not visualized.

Other findings: No abnormal free fluid. Possible echogenic debris
within the dependent portion of the bladder which could be seen with
cystitis.
IMPRESSION: Prior hysterectomy. Nonvisualization of the ovaries. No free pelvic
fluid.

Possible echogenic debris within the dependent portion of the
bladder which could be seen with cystitis.

## 2020-09-15 ENCOUNTER — Encounter: Payer: Self-pay | Admitting: Family Medicine

## 2020-09-16 ENCOUNTER — Ambulatory Visit (HOSPITAL_COMMUNITY): Payer: Medicare PPO | Attending: Family Medicine

## 2020-09-16 ENCOUNTER — Other Ambulatory Visit: Payer: Self-pay

## 2020-09-16 DIAGNOSIS — M1712 Unilateral primary osteoarthritis, left knee: Secondary | ICD-10-CM | POA: Diagnosis not present

## 2020-09-16 DIAGNOSIS — I5031 Acute diastolic (congestive) heart failure: Secondary | ICD-10-CM | POA: Diagnosis not present

## 2020-09-16 LAB — ECHOCARDIOGRAM COMPLETE
Area-P 1/2: 4.55 cm2
S' Lateral: 4.3 cm

## 2020-09-16 MED ORDER — FREESTYLE LIBRE 14 DAY READER DEVI: 1.0000 | Freq: Every day | 1 refills | Status: DC | PRN

## 2020-09-16 MED ORDER — FREESTYLE LIBRE 14 DAY SENSOR MISC: 1.0000 | 11 refills | Status: DC

## 2020-09-16 MED ORDER — PERFLUTREN LIPID MICROSPHERE
1.0000 mL | INTRAVENOUS | Status: AC | PRN
Start: 2020-09-16 — End: 2020-09-16
  Administered 2020-09-16: 3 mL via INTRAVENOUS

## 2020-09-19 ENCOUNTER — Other Ambulatory Visit: Payer: Self-pay | Admitting: Podiatry

## 2020-09-19 NOTE — Telephone Encounter (Signed)
Please advise 

## 2020-09-20 IMAGING — CT CT HEAD W/O CM
3 of 4 series · 16 of 47 positions shown, 19 images · non-contrast
Comparison: 12/13/2017

CLINICAL DATA: Weakness and shortness of breath for 5 days, altered
level of consciousness

EXAM:
CT HEAD WITHOUT CONTRAST
TECHNIQUE: Contiguous axial images were obtained from the base of the skull
through the vertex without intravenous contrast.

[Series 2: head wo · axial · 0.47mm/px · z∈[-137,-12]mm · 10 of 30 slices shown, 13 images]
[im 3/30  brain]
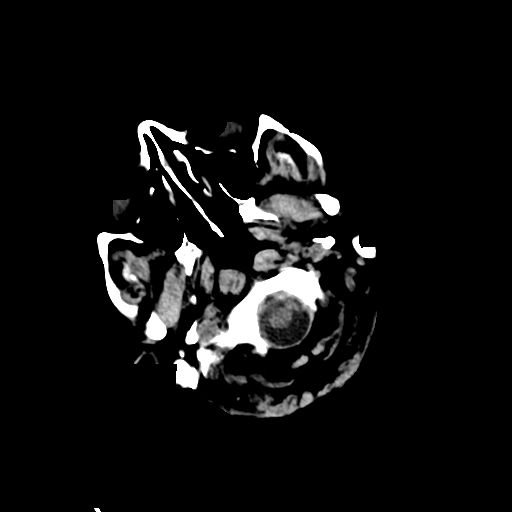
[im 3/30  bone]
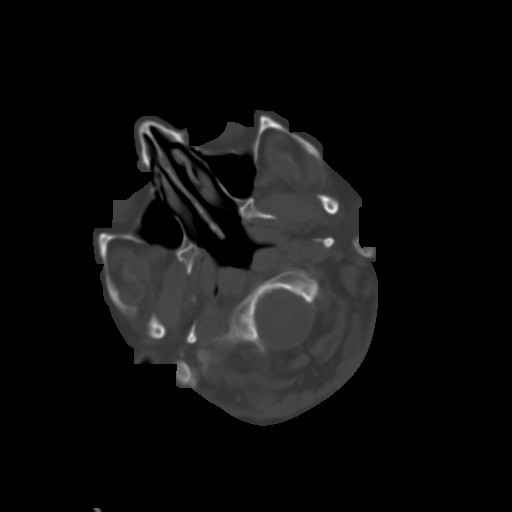
[im 6/30  brain]
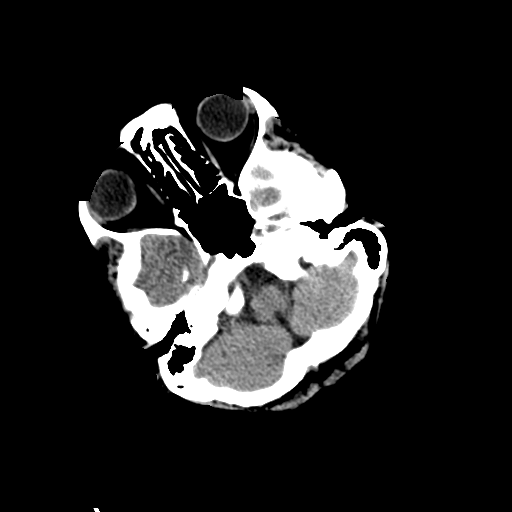
[im 9/30  brain]
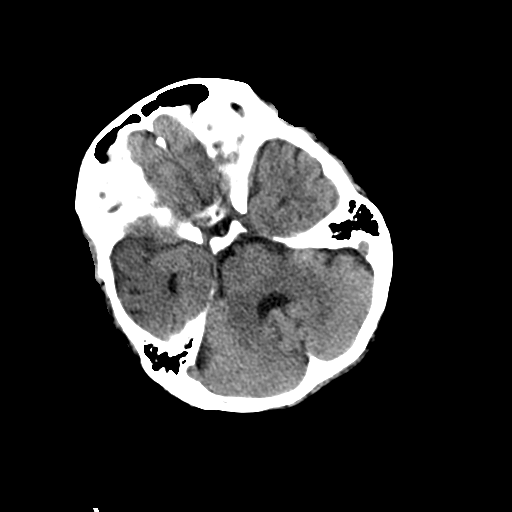
[im 11/30  brain]
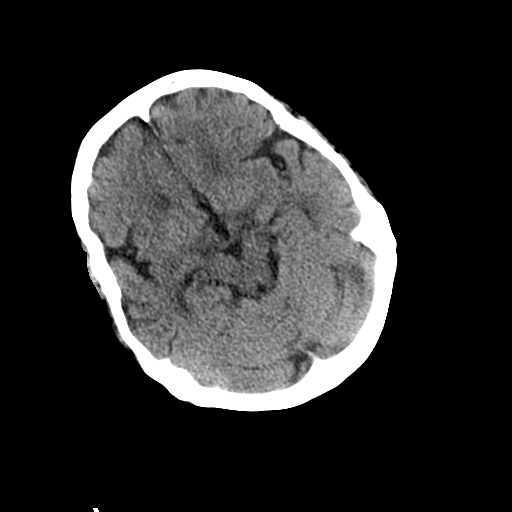
[im 14/30  brain]
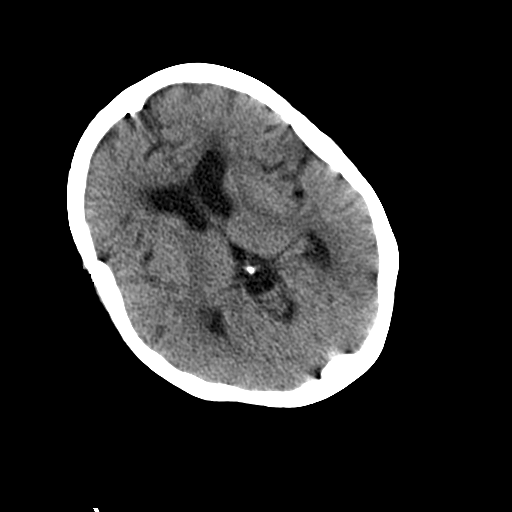
[im 14/30  bone]
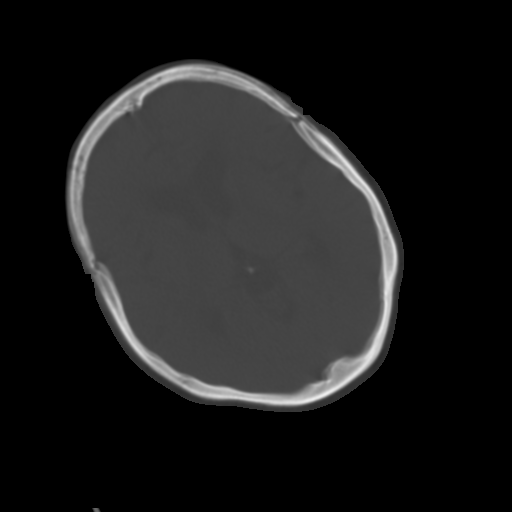
[im 17/30  brain]
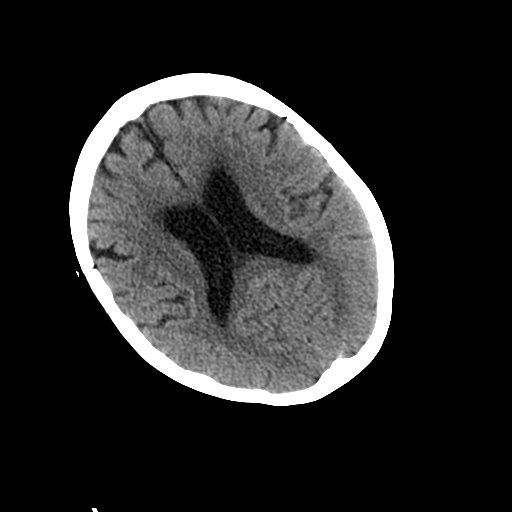
[im 20/30  brain]
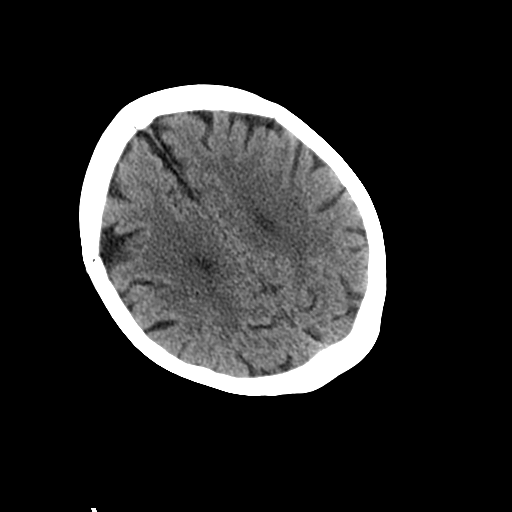
[im 23/30  brain]
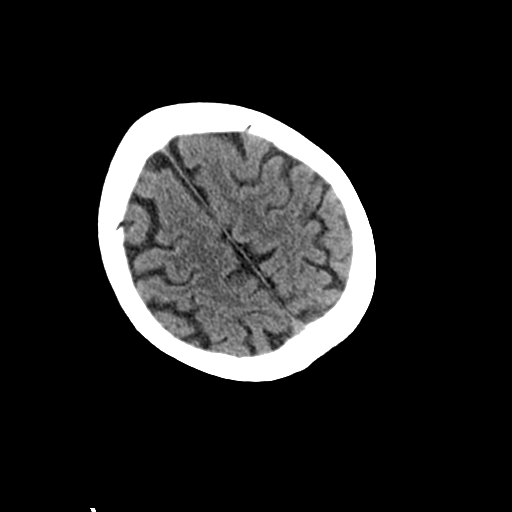
[im 25/30  brain]
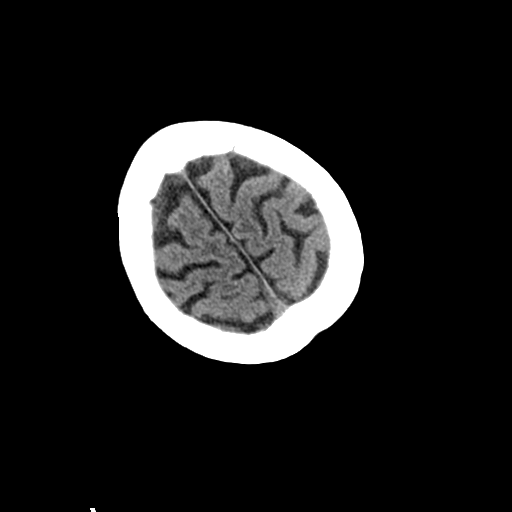
[im 25/30  bone]
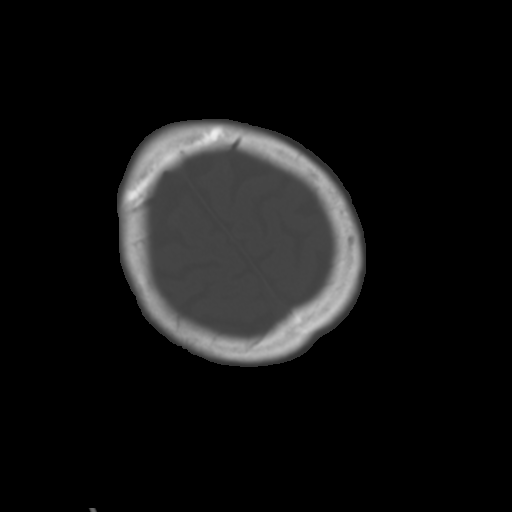
[im 28/30  brain]
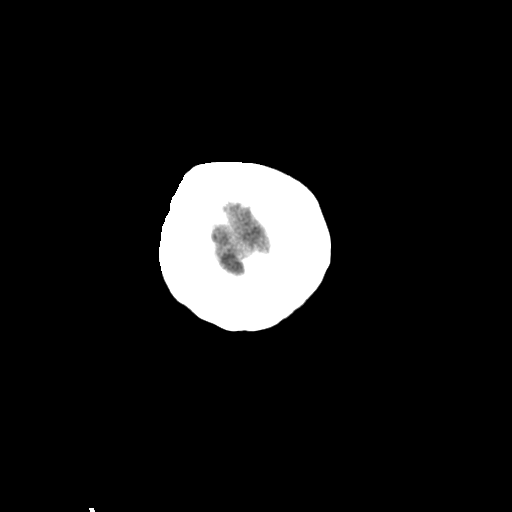

[Series 6: sagittal soft tissue · sagittal · 0.32mm/px · 3 of 55 slices shown]
[im 19/55  brain]
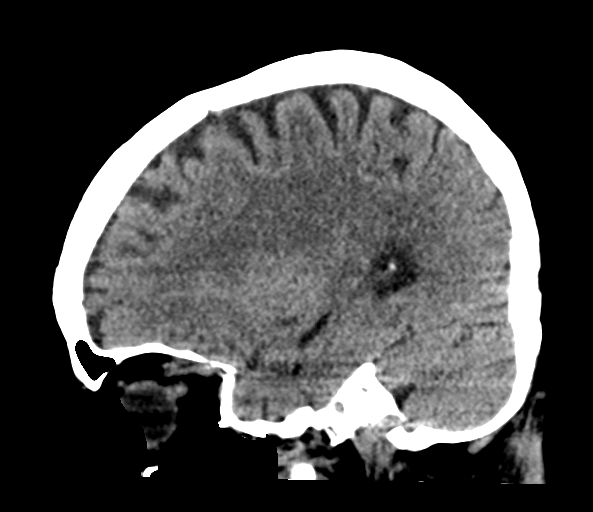
[im 28/55  brain]
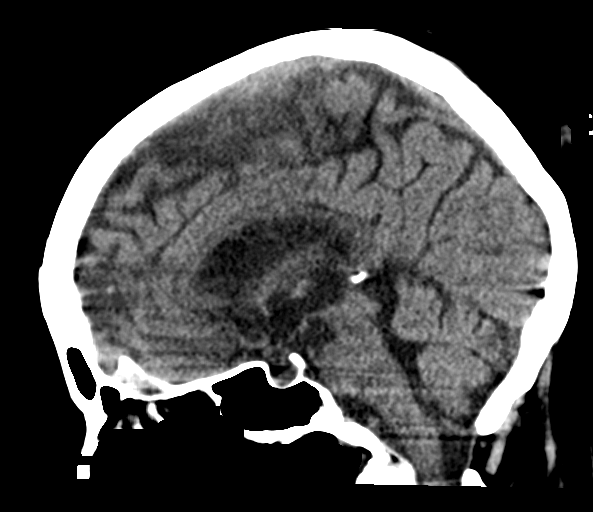
[im 37/55  brain]
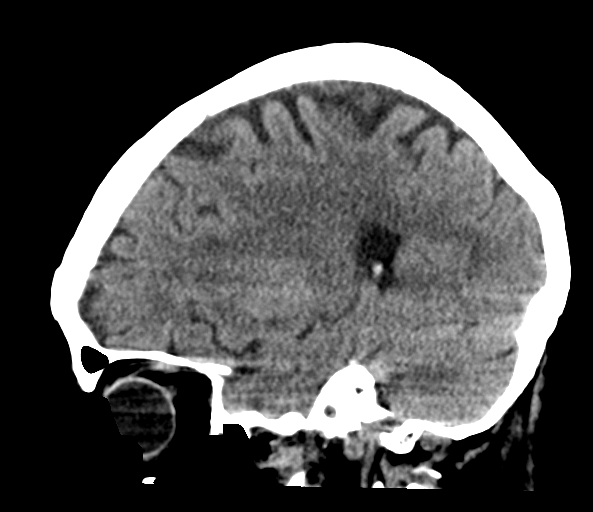

[Series 602: <mpr thick range> · coronal · 0.49mm/px · 3 of 90 slices shown]
[im 18/90  brain]
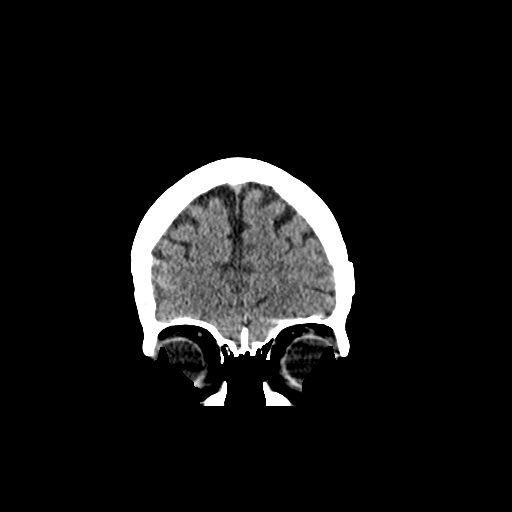
[im 36/90  brain]
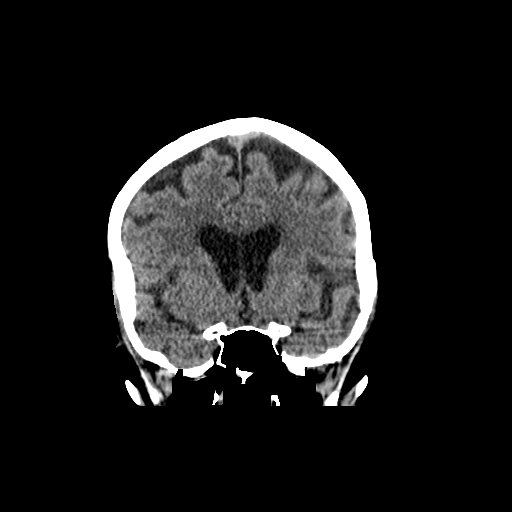
[im 54/90  brain]
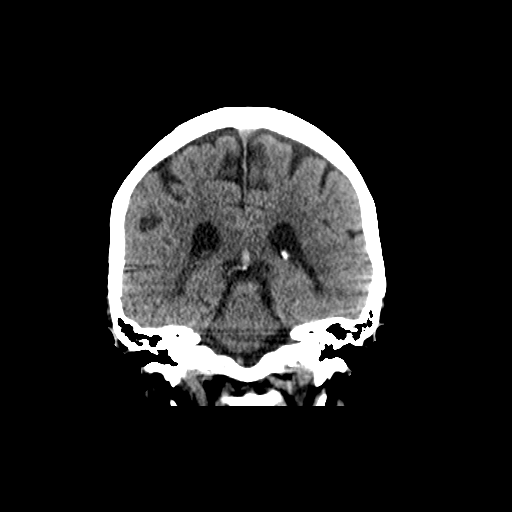

[16 of 47 positions shown; findings below may reference images not displayed]

FINDINGS: Brain: Generalized atrophy. Normal ventricular morphology. No
midline shift or mass effect. Small vessel chronic ischemic changes
of deep cerebral white matter. No intracranial hemorrhage, mass
lesion, evidence of acute infarction, or extra-axial fluid
collection.

Vascular: No hyperdense vessels

Skull: Intact

Sinuses/Orbits: Clear

Other: N/A
IMPRESSION: No acute intracranial abnormalities.

## 2020-09-21 IMAGING — DX DG HAND 2V*L*
2 series · 2 of 2 positions shown · non-contrast
Comparison: 02/06/2006

CLINICAL DATA: New onset pain

EXAM:
LEFT HAND - 2 VIEW

[hand ap]
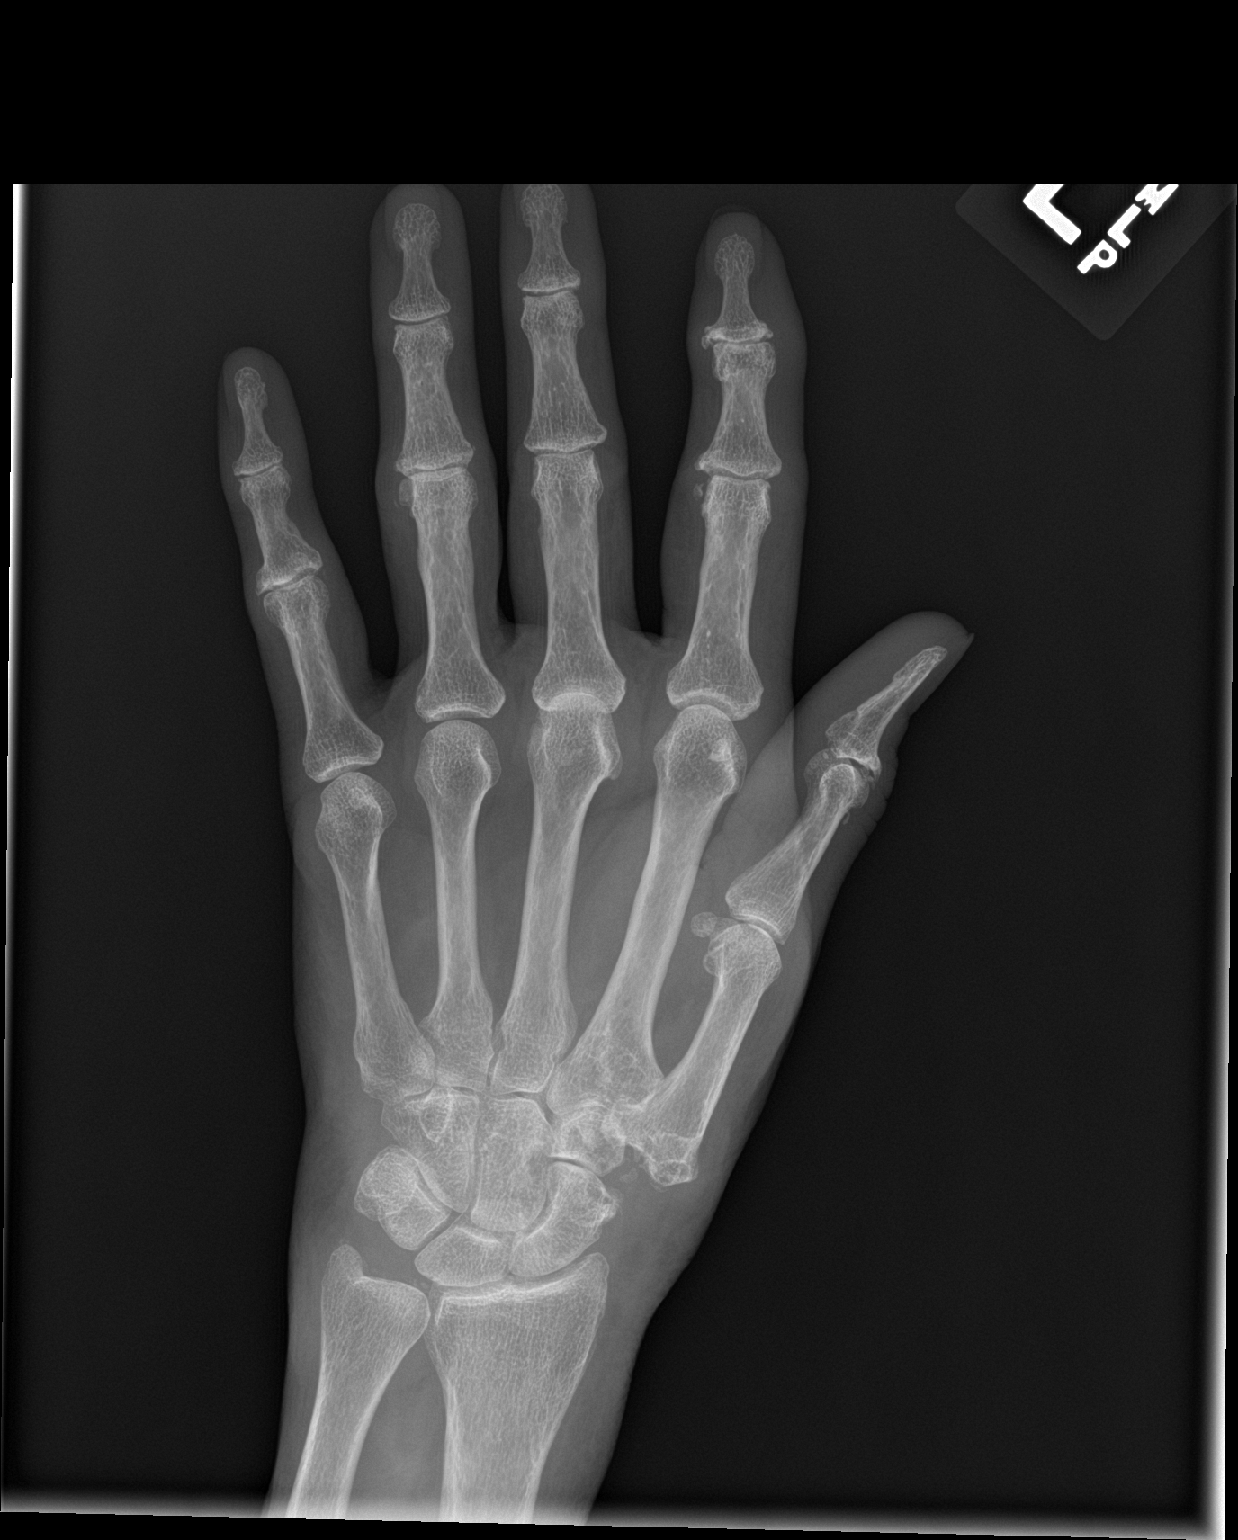

[hand lat]
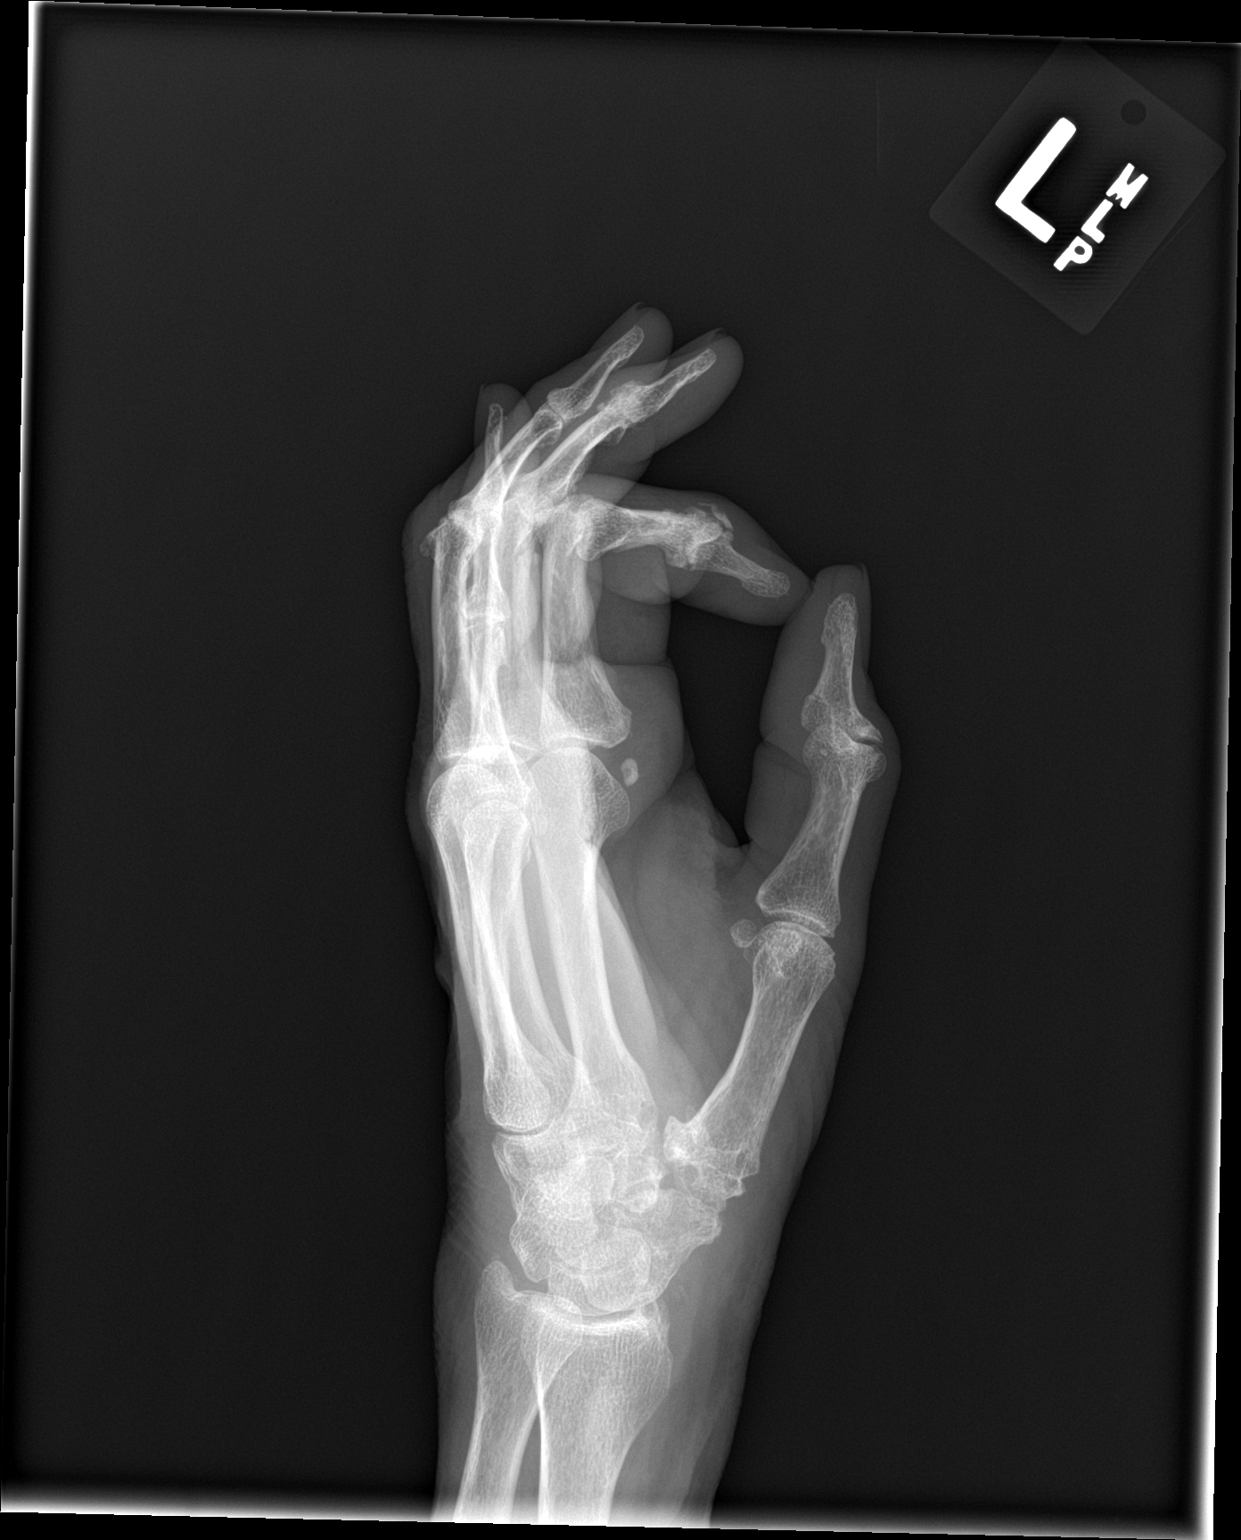

[2 of 2 positions shown; findings below may reference images not displayed]

FINDINGS: Interval worsening of osteoarthritis of the inter phalangeal joints.
Most severe DIP joint involvement is at the index finger. There is
also chronic degenerative arthropathy at the first carpometacarpal
joint. It appears that the patient may have had previous resection
of the trapezium.
IMPRESSION: Worsening of osteoarthritis of the interphalangeal joints, most
severe at the DIP joint of the index finger. Apparent interval
resection of the trapezium. Osteoarthritis in the first
carpometacarpal region.

## 2020-09-22 ENCOUNTER — Encounter (HOSPITAL_COMMUNITY)
Admission: EM | Disposition: A | Discharge status: Skilled Nursing Facility | Payer: Self-pay | Source: Home / Self Care | Attending: Internal Medicine

## 2020-09-22 ENCOUNTER — Inpatient Hospital Stay (HOSPITAL_COMMUNITY): Payer: Medicare PPO | Admitting: Anesthesiology

## 2020-09-22 ENCOUNTER — Emergency Department (HOSPITAL_COMMUNITY): Payer: Medicare PPO

## 2020-09-22 ENCOUNTER — Encounter (HOSPITAL_COMMUNITY): Payer: Self-pay | Admitting: *Deleted

## 2020-09-22 ENCOUNTER — Inpatient Hospital Stay: Admit: 2020-09-22 | Payer: Medicare PPO | Admitting: Orthopedic Surgery

## 2020-09-22 ENCOUNTER — Inpatient Hospital Stay (HOSPITAL_COMMUNITY)
Admission: EM | Admit: 2020-09-22 | Discharge: 2020-09-30 | DRG: 501 | Disposition: A | Discharge status: Skilled Nursing Facility | Payer: Medicare PPO | Attending: Internal Medicine | Admitting: Internal Medicine

## 2020-09-22 ENCOUNTER — Other Ambulatory Visit: Payer: Self-pay

## 2020-09-22 DIAGNOSIS — J398 Other specified diseases of upper respiratory tract: Secondary | ICD-10-CM | POA: Diagnosis not present

## 2020-09-22 DIAGNOSIS — R5381 Other malaise: Secondary | ICD-10-CM | POA: Diagnosis not present

## 2020-09-22 DIAGNOSIS — M6281 Muscle weakness (generalized): Secondary | ICD-10-CM | POA: Diagnosis not present

## 2020-09-22 DIAGNOSIS — M79641 Pain in right hand: Secondary | ICD-10-CM | POA: Diagnosis not present

## 2020-09-22 DIAGNOSIS — L03113 Cellulitis of right upper limb: Secondary | ICD-10-CM | POA: Diagnosis not present

## 2020-09-22 DIAGNOSIS — I5042 Chronic combined systolic (congestive) and diastolic (congestive) heart failure: Secondary | ICD-10-CM | POA: Diagnosis present

## 2020-09-22 DIAGNOSIS — Z66 Do not resuscitate: Secondary | ICD-10-CM | POA: Diagnosis not present

## 2020-09-22 DIAGNOSIS — E119 Type 2 diabetes mellitus without complications: Secondary | ICD-10-CM

## 2020-09-22 DIAGNOSIS — Z96611 Presence of right artificial shoulder joint: Secondary | ICD-10-CM | POA: Diagnosis present

## 2020-09-22 DIAGNOSIS — L02511 Cutaneous abscess of right hand: Secondary | ICD-10-CM | POA: Diagnosis present

## 2020-09-22 DIAGNOSIS — Z853 Personal history of malignant neoplasm of breast: Secondary | ICD-10-CM

## 2020-09-22 DIAGNOSIS — Z86718 Personal history of other venous thrombosis and embolism: Secondary | ICD-10-CM

## 2020-09-22 DIAGNOSIS — M810 Age-related osteoporosis without current pathological fracture: Secondary | ICD-10-CM | POA: Diagnosis present

## 2020-09-22 DIAGNOSIS — S2242XA Multiple fractures of ribs, left side, initial encounter for closed fracture: Secondary | ICD-10-CM | POA: Diagnosis not present

## 2020-09-22 DIAGNOSIS — J9811 Atelectasis: Secondary | ICD-10-CM | POA: Diagnosis not present

## 2020-09-22 DIAGNOSIS — D649 Anemia, unspecified: Secondary | ICD-10-CM | POA: Diagnosis present

## 2020-09-22 DIAGNOSIS — E785 Hyperlipidemia, unspecified: Secondary | ICD-10-CM | POA: Diagnosis present

## 2020-09-22 DIAGNOSIS — I959 Hypotension, unspecified: Secondary | ICD-10-CM | POA: Diagnosis not present

## 2020-09-22 DIAGNOSIS — Z20822 Contact with and (suspected) exposure to covid-19: Secondary | ICD-10-CM | POA: Diagnosis not present

## 2020-09-22 DIAGNOSIS — Z981 Arthrodesis status: Secondary | ICD-10-CM

## 2020-09-22 DIAGNOSIS — E1169 Type 2 diabetes mellitus with other specified complication: Secondary | ICD-10-CM | POA: Diagnosis present

## 2020-09-22 DIAGNOSIS — K589 Irritable bowel syndrome without diarrhea: Secondary | ICD-10-CM | POA: Diagnosis present

## 2020-09-22 DIAGNOSIS — Z961 Presence of intraocular lens: Secondary | ICD-10-CM | POA: Diagnosis present

## 2020-09-22 DIAGNOSIS — M069 Rheumatoid arthritis, unspecified: Secondary | ICD-10-CM | POA: Diagnosis not present

## 2020-09-22 DIAGNOSIS — I10 Essential (primary) hypertension: Secondary | ICD-10-CM | POA: Diagnosis not present

## 2020-09-22 DIAGNOSIS — Z90710 Acquired absence of both cervix and uterus: Secondary | ICD-10-CM

## 2020-09-22 DIAGNOSIS — E876 Hypokalemia: Secondary | ICD-10-CM | POA: Diagnosis not present

## 2020-09-22 DIAGNOSIS — Z7901 Long term (current) use of anticoagulants: Secondary | ICD-10-CM

## 2020-09-22 DIAGNOSIS — M06341 Rheumatoid nodule, right hand: Secondary | ICD-10-CM | POA: Diagnosis present

## 2020-09-22 DIAGNOSIS — M71121 Other infective bursitis, right elbow: Principal | ICD-10-CM | POA: Diagnosis present

## 2020-09-22 DIAGNOSIS — B379 Candidiasis, unspecified: Secondary | ICD-10-CM | POA: Diagnosis present

## 2020-09-22 DIAGNOSIS — E1122 Type 2 diabetes mellitus with diabetic chronic kidney disease: Secondary | ICD-10-CM | POA: Diagnosis present

## 2020-09-22 DIAGNOSIS — Z79899 Other long term (current) drug therapy: Secondary | ICD-10-CM

## 2020-09-22 DIAGNOSIS — Z9109 Other allergy status, other than to drugs and biological substances: Secondary | ICD-10-CM

## 2020-09-22 DIAGNOSIS — E039 Hypothyroidism, unspecified: Secondary | ICD-10-CM | POA: Diagnosis present

## 2020-09-22 DIAGNOSIS — E1151 Type 2 diabetes mellitus with diabetic peripheral angiopathy without gangrene: Secondary | ICD-10-CM | POA: Diagnosis not present

## 2020-09-22 DIAGNOSIS — A419 Sepsis, unspecified organism: Secondary | ICD-10-CM | POA: Diagnosis not present

## 2020-09-22 DIAGNOSIS — Z86010 Personal history of colonic polyps: Secondary | ICD-10-CM

## 2020-09-22 DIAGNOSIS — Z96659 Presence of unspecified artificial knee joint: Secondary | ICD-10-CM | POA: Diagnosis present

## 2020-09-22 DIAGNOSIS — Z880 Allergy status to penicillin: Secondary | ICD-10-CM

## 2020-09-22 DIAGNOSIS — M0600 Rheumatoid arthritis without rheumatoid factor, unspecified site: Secondary | ICD-10-CM | POA: Diagnosis present

## 2020-09-22 DIAGNOSIS — I130 Hypertensive heart and chronic kidney disease with heart failure and stage 1 through stage 4 chronic kidney disease, or unspecified chronic kidney disease: Secondary | ICD-10-CM | POA: Diagnosis present

## 2020-09-22 DIAGNOSIS — J4540 Moderate persistent asthma, uncomplicated: Secondary | ICD-10-CM | POA: Diagnosis present

## 2020-09-22 DIAGNOSIS — I2510 Atherosclerotic heart disease of native coronary artery without angina pectoris: Secondary | ICD-10-CM | POA: Diagnosis present

## 2020-09-22 DIAGNOSIS — E7800 Pure hypercholesterolemia, unspecified: Secondary | ICD-10-CM | POA: Diagnosis present

## 2020-09-22 DIAGNOSIS — Z743 Need for continuous supervision: Secondary | ICD-10-CM | POA: Diagnosis not present

## 2020-09-22 DIAGNOSIS — R262 Difficulty in walking, not elsewhere classified: Secondary | ICD-10-CM | POA: Diagnosis not present

## 2020-09-22 DIAGNOSIS — Z885 Allergy status to narcotic agent status: Secondary | ICD-10-CM

## 2020-09-22 DIAGNOSIS — Z7952 Long term (current) use of systemic steroids: Secondary | ICD-10-CM

## 2020-09-22 DIAGNOSIS — M797 Fibromyalgia: Secondary | ICD-10-CM | POA: Diagnosis present

## 2020-09-22 DIAGNOSIS — Z9842 Cataract extraction status, left eye: Secondary | ICD-10-CM

## 2020-09-22 DIAGNOSIS — N1832 Chronic kidney disease, stage 3b: Secondary | ICD-10-CM | POA: Diagnosis not present

## 2020-09-22 DIAGNOSIS — Z9841 Cataract extraction status, right eye: Secondary | ICD-10-CM

## 2020-09-22 DIAGNOSIS — F32A Depression, unspecified: Secondary | ICD-10-CM | POA: Diagnosis not present

## 2020-09-22 DIAGNOSIS — E1165 Type 2 diabetes mellitus with hyperglycemia: Secondary | ICD-10-CM | POA: Diagnosis present

## 2020-09-22 DIAGNOSIS — M869 Osteomyelitis, unspecified: Secondary | ICD-10-CM

## 2020-09-22 DIAGNOSIS — I509 Heart failure, unspecified: Secondary | ICD-10-CM | POA: Diagnosis not present

## 2020-09-22 DIAGNOSIS — E0500 Thyrotoxicosis with diffuse goiter without thyrotoxic crisis or storm: Secondary | ICD-10-CM | POA: Diagnosis present

## 2020-09-22 DIAGNOSIS — I5043 Acute on chronic combined systolic (congestive) and diastolic (congestive) heart failure: Secondary | ICD-10-CM | POA: Diagnosis present

## 2020-09-22 DIAGNOSIS — Z8249 Family history of ischemic heart disease and other diseases of the circulatory system: Secondary | ICD-10-CM

## 2020-09-22 DIAGNOSIS — R531 Weakness: Secondary | ICD-10-CM | POA: Diagnosis not present

## 2020-09-22 DIAGNOSIS — K219 Gastro-esophageal reflux disease without esophagitis: Secondary | ICD-10-CM | POA: Diagnosis present

## 2020-09-22 DIAGNOSIS — M79671 Pain in right foot: Secondary | ICD-10-CM | POA: Diagnosis not present

## 2020-09-22 DIAGNOSIS — I5022 Chronic systolic (congestive) heart failure: Secondary | ICD-10-CM | POA: Diagnosis present

## 2020-09-22 DIAGNOSIS — I517 Cardiomegaly: Secondary | ICD-10-CM | POA: Diagnosis not present

## 2020-09-22 DIAGNOSIS — E782 Mixed hyperlipidemia: Secondary | ICD-10-CM | POA: Diagnosis not present

## 2020-09-22 DIAGNOSIS — M1712 Unilateral primary osteoarthritis, left knee: Secondary | ICD-10-CM | POA: Diagnosis not present

## 2020-09-22 DIAGNOSIS — Z888 Allergy status to other drugs, medicaments and biological substances status: Secondary | ICD-10-CM

## 2020-09-22 DIAGNOSIS — R2681 Unsteadiness on feet: Secondary | ICD-10-CM | POA: Diagnosis not present

## 2020-09-22 DIAGNOSIS — Z833 Family history of diabetes mellitus: Secondary | ICD-10-CM

## 2020-09-22 DIAGNOSIS — E739 Lactose intolerance, unspecified: Secondary | ICD-10-CM | POA: Diagnosis present

## 2020-09-22 DIAGNOSIS — R918 Other nonspecific abnormal finding of lung field: Secondary | ICD-10-CM | POA: Insufficient documentation

## 2020-09-22 DIAGNOSIS — Z86711 Personal history of pulmonary embolism: Secondary | ICD-10-CM

## 2020-09-22 DIAGNOSIS — B3789 Other sites of candidiasis: Secondary | ICD-10-CM

## 2020-09-22 DIAGNOSIS — M19041 Primary osteoarthritis, right hand: Secondary | ICD-10-CM | POA: Diagnosis not present

## 2020-09-22 DIAGNOSIS — Z881 Allergy status to other antibiotic agents status: Secondary | ICD-10-CM

## 2020-09-22 DIAGNOSIS — M7989 Other specified soft tissue disorders: Secondary | ICD-10-CM | POA: Diagnosis not present

## 2020-09-22 HISTORY — PX: I & D EXTREMITY: SHX5045

## 2020-09-22 HISTORY — DX: Cutaneous abscess of right hand: L02.511

## 2020-09-22 HISTORY — DX: Other infective bursitis, right elbow: M71.121

## 2020-09-22 LAB — CBC WITH DIFFERENTIAL/PLATELET
Abs Immature Granulocytes: 0.29 10*3/uL — ABNORMAL HIGH (ref 0.00–0.07)
Basophils Absolute: 0.1 10*3/uL (ref 0.0–0.1)
Basophils Relative: 0 %
Eosinophils Absolute: 0.4 10*3/uL (ref 0.0–0.5)
Eosinophils Relative: 2 %
HCT: 37.3 % (ref 36.0–46.0)
Hemoglobin: 11.7 g/dL — ABNORMAL LOW (ref 12.0–15.0)
Immature Granulocytes: 2 %
Lymphocytes Relative: 7 %
Lymphs Abs: 1.1 10*3/uL (ref 0.7–4.0)
MCH: 33.1 pg (ref 26.0–34.0)
MCHC: 31.4 g/dL (ref 30.0–36.0)
MCV: 105.7 fL — ABNORMAL HIGH (ref 80.0–100.0)
Monocytes Absolute: 2.2 10*3/uL — ABNORMAL HIGH (ref 0.1–1.0)
Monocytes Relative: 15 %
Neutro Abs: 10.6 10*3/uL — ABNORMAL HIGH (ref 1.7–7.7)
Neutrophils Relative %: 74 %
Platelets: 225 10*3/uL (ref 150–400)
RBC: 3.53 MIL/uL — ABNORMAL LOW (ref 3.87–5.11)
RDW: 16.7 % — ABNORMAL HIGH (ref 11.5–15.5)
WBC: 14.5 10*3/uL — ABNORMAL HIGH (ref 4.0–10.5)
nRBC: 0.1 % (ref 0.0–0.2)

## 2020-09-22 LAB — COMPREHENSIVE METABOLIC PANEL
ALT: 72 U/L — ABNORMAL HIGH (ref 0–44)
AST: 53 U/L — ABNORMAL HIGH (ref 15–41)
Albumin: 3.3 g/dL — ABNORMAL LOW (ref 3.5–5.0)
Alkaline Phosphatase: 82 U/L (ref 38–126)
Anion gap: 13 (ref 5–15)
BUN: 29 mg/dL — ABNORMAL HIGH (ref 8–23)
CO2: 27 mmol/L (ref 22–32)
Calcium: 10 mg/dL (ref 8.9–10.3)
Chloride: 98 mmol/L (ref 98–111)
Creatinine, Ser: 1.59 mg/dL — ABNORMAL HIGH (ref 0.44–1.00)
GFR, Estimated: 34 mL/min — ABNORMAL LOW (ref >60)
Glucose, Bld: 157 mg/dL — ABNORMAL HIGH (ref 70–99)
Potassium: 3.9 mmol/L (ref 3.5–5.1)
Sodium: 138 mmol/L (ref 135–145)
Total Bilirubin: 1.1 mg/dL (ref 0.3–1.2)
Total Protein: 6.5 g/dL (ref 6.5–8.1)

## 2020-09-22 LAB — RESP PANEL BY RT-PCR (FLU A&B, COVID) ARPGX2
Influenza A by PCR: NEGATIVE
Influenza B by PCR: NEGATIVE
SARS Coronavirus 2 by RT PCR: NEGATIVE

## 2020-09-22 LAB — PROTIME-INR
INR: 1 (ref 0.8–1.2)
Prothrombin Time: 13.1 seconds (ref 11.4–15.2)

## 2020-09-22 LAB — CBG MONITORING, ED: Glucose-Capillary: 145 mg/dL — ABNORMAL HIGH (ref 70–99)

## 2020-09-22 LAB — BRAIN NATRIURETIC PEPTIDE: B Natriuretic Peptide: 424.3 pg/mL — ABNORMAL HIGH (ref 0.0–100.0)

## 2020-09-22 LAB — LACTIC ACID, PLASMA: Lactic Acid, Venous: 1.6 mmol/L (ref 0.5–1.9)

## 2020-09-22 SURGERY — IRRIGATION AND DEBRIDEMENT EXTREMITY
Anesthesia: General | Site: Arm Lower | Laterality: Right

## 2020-09-22 MED ORDER — FENTANYL CITRATE (PF) 100 MCG/2ML IJ SOLN
INTRAMUSCULAR | Status: DC | PRN
  Administered 2020-09-22: 25 ug via INTRAVENOUS
  Administered 2020-09-22: 50 ug via INTRAVENOUS
  Administered 2020-09-22: 25 ug via INTRAVENOUS
  Administered 2020-09-22 (×2): 50 ug via INTRAVENOUS

## 2020-09-22 MED ORDER — FLUCONAZOLE 100MG IVPB
100.0000 mg | Freq: Once | INTRAVENOUS | Status: AC
  Administered 2020-09-22: 100 mg via INTRAVENOUS
  Filled 2020-09-22 (×2): qty 50

## 2020-09-22 MED ORDER — CEFAZOLIN SODIUM-DEXTROSE 1-4 GM/50ML-% IV SOLN
1.0000 g | Freq: Once | INTRAVENOUS | Status: AC
  Administered 2020-09-22: 1 g via INTRAVENOUS
  Filled 2020-09-22: qty 50

## 2020-09-22 MED ORDER — FLUCONAZOLE 100 MG PO TABS
100.0000 mg | ORAL_TABLET | Freq: Every day | ORAL | Status: DC
  Administered 2020-09-23: 100 mg via ORAL
  Filled 2020-09-22 (×2): qty 1

## 2020-09-22 MED ORDER — POVIDONE-IODINE 10 % EX SWAB: 2.0000 "application " | Freq: Once | CUTANEOUS | Status: DC

## 2020-09-22 MED ORDER — ONDANSETRON HCL 4 MG/2ML IJ SOLN
INTRAMUSCULAR | Status: DC | PRN
Start: 2020-09-22 — End: 2020-09-22
  Administered 2020-09-22: 4 mg via INTRAVENOUS

## 2020-09-22 MED ORDER — LACTATED RINGERS IV SOLN: INTRAVENOUS | Status: DC | PRN

## 2020-09-22 MED ORDER — INSULIN ASPART 100 UNIT/ML IJ SOLN
0.0000 [IU] | Freq: Every day | INTRAMUSCULAR | Status: DC
  Administered 2020-09-23 – 2020-09-24 (×2): 3 [IU] via SUBCUTANEOUS
  Administered 2020-09-25 – 2020-09-26 (×2): 2 [IU] via SUBCUTANEOUS
  Administered 2020-09-27: 5 [IU] via SUBCUTANEOUS
  Filled 2020-09-22: qty 0.05

## 2020-09-22 MED ORDER — SENNA 8.6 MG PO TABS: 1.0000 | ORAL_TABLET | Freq: Every evening | ORAL | Status: DC | PRN

## 2020-09-22 MED ORDER — RIVAROXABAN 20 MG PO TABS
20.0000 mg | ORAL_TABLET | Freq: Every day | ORAL | Status: DC
  Filled 2020-09-22: qty 1

## 2020-09-22 MED ORDER — ACETAMINOPHEN 10 MG/ML IV SOLN
INTRAVENOUS | Status: AC
  Filled 2020-09-22: qty 100

## 2020-09-22 MED ORDER — AMISULPRIDE (ANTIEMETIC) 5 MG/2ML IV SOLN: 10.0000 mg | Freq: Once | INTRAVENOUS | Status: DC | PRN

## 2020-09-22 MED ORDER — ONDANSETRON HCL 4 MG PO TABS: 4.0000 mg | ORAL_TABLET | Freq: Four times a day (QID) | ORAL | Status: DC | PRN

## 2020-09-22 MED ORDER — ONDANSETRON HCL 4 MG/2ML IJ SOLN: 4.0000 mg | Freq: Once | INTRAMUSCULAR | Status: DC | PRN

## 2020-09-22 MED ORDER — BUPIVACAINE HCL (PF) 0.25 % IJ SOLN
INTRAMUSCULAR | Status: DC | PRN
  Administered 2020-09-22: 6 mL
  Administered 2020-09-22: 10 mL

## 2020-09-22 MED ORDER — VANCOMYCIN HCL IN DEXTROSE 1-5 GM/200ML-% IV SOLN: 1000.0000 mg | INTRAVENOUS | Status: DC

## 2020-09-22 MED ORDER — PHENYLEPHRINE 40 MCG/ML (10ML) SYRINGE FOR IV PUSH (FOR BLOOD PRESSURE SUPPORT)
PREFILLED_SYRINGE | INTRAVENOUS | Status: AC
  Filled 2020-09-22: qty 10

## 2020-09-22 MED ORDER — VANCOMYCIN HCL IN DEXTROSE 1-5 GM/200ML-% IV SOLN
INTRAVENOUS | Status: AC
  Filled 2020-09-22: qty 200

## 2020-09-22 MED ORDER — ARFORMOTEROL TARTRATE 15 MCG/2ML IN NEBU
15.0000 ug | INHALATION_SOLUTION | Freq: Two times a day (BID) | RESPIRATORY_TRACT | Status: DC
  Administered 2020-09-23 – 2020-09-30 (×15): 15 ug via RESPIRATORY_TRACT
  Filled 2020-09-22 (×18): qty 2

## 2020-09-22 MED ORDER — CHLORHEXIDINE GLUCONATE 4 % EX LIQD: 60.0000 mL | Freq: Once | CUTANEOUS | Status: DC

## 2020-09-22 MED ORDER — FENTANYL CITRATE (PF) 100 MCG/2ML IJ SOLN: 25.0000 ug | INTRAMUSCULAR | Status: DC | PRN

## 2020-09-22 MED ORDER — AEROCHAMBER Z-STAT PLUS/MEDIUM MISC
1.0000 | Freq: Once | Status: AC
  Administered 2020-09-22: 1
  Filled 2020-09-22: qty 1

## 2020-09-22 MED ORDER — SUCCINYLCHOLINE CHLORIDE 200 MG/10ML IV SOSY
PREFILLED_SYRINGE | INTRAVENOUS | Status: AC
  Filled 2020-09-22: qty 10

## 2020-09-22 MED ORDER — BUPIVACAINE HCL 0.25 % IJ SOLN
INTRAMUSCULAR | Status: AC
  Filled 2020-09-22: qty 1

## 2020-09-22 MED ORDER — PROPOFOL 10 MG/ML IV BOLUS
INTRAVENOUS | Status: AC
  Filled 2020-09-22: qty 20

## 2020-09-22 MED ORDER — GUAIFENESIN ER 600 MG PO TB12
600.0000 mg | ORAL_TABLET | Freq: Two times a day (BID) | ORAL | Status: DC
  Administered 2020-09-23 – 2020-09-30 (×15): 600 mg via ORAL
  Filled 2020-09-22 (×15): qty 1

## 2020-09-22 MED ORDER — UMECLIDINIUM BROMIDE 62.5 MCG/INH IN AEPB: 1.0000 | INHALATION_SPRAY | Freq: Every day | RESPIRATORY_TRACT | Status: DC

## 2020-09-22 MED ORDER — SODIUM CHLORIDE 0.9 % IR SOLN
Status: DC | PRN
  Administered 2020-09-22: 1000 mL

## 2020-09-22 MED ORDER — LIDOCAINE 2% (20 MG/ML) 5 ML SYRINGE
INTRAMUSCULAR | Status: DC | PRN
  Administered 2020-09-22: 80 mg via INTRAVENOUS

## 2020-09-22 MED ORDER — INSULIN ASPART 100 UNIT/ML IJ SOLN
0.0000 [IU] | Freq: Three times a day (TID) | INTRAMUSCULAR | Status: DC
  Administered 2020-09-22: 2 [IU] via SUBCUTANEOUS
  Administered 2020-09-23: 5 [IU] via SUBCUTANEOUS
  Administered 2020-09-23: 11 [IU] via SUBCUTANEOUS
  Administered 2020-09-23: 5 [IU] via SUBCUTANEOUS
  Administered 2020-09-24: 11 [IU] via SUBCUTANEOUS
  Administered 2020-09-24: 5 [IU] via SUBCUTANEOUS
  Administered 2020-09-24: 8 [IU] via SUBCUTANEOUS
  Administered 2020-09-25: 3 [IU] via SUBCUTANEOUS
  Administered 2020-09-25: 8 [IU] via SUBCUTANEOUS
  Administered 2020-09-25: 3 [IU] via SUBCUTANEOUS
  Administered 2020-09-26: 5 [IU] via SUBCUTANEOUS
  Administered 2020-09-26: 8 [IU] via SUBCUTANEOUS
  Administered 2020-09-27: 5 [IU] via SUBCUTANEOUS
  Administered 2020-09-27: 8 [IU] via SUBCUTANEOUS
  Filled 2020-09-22: qty 0.15

## 2020-09-22 MED ORDER — ALBUTEROL SULFATE HFA 108 (90 BASE) MCG/ACT IN AERS
2.0000 | INHALATION_SPRAY | Freq: Once | RESPIRATORY_TRACT | Status: AC
  Administered 2020-09-22: 2 via RESPIRATORY_TRACT
  Filled 2020-09-22: qty 6.7

## 2020-09-22 MED ORDER — SUCCINYLCHOLINE CHLORIDE 200 MG/10ML IV SOSY
PREFILLED_SYRINGE | INTRAVENOUS | Status: DC | PRN
Start: 2020-09-22 — End: 2020-09-22
  Administered 2020-09-22: 140 mg via INTRAVENOUS

## 2020-09-22 MED ORDER — FENTANYL CITRATE (PF) 100 MCG/2ML IJ SOLN
100.0000 ug | Freq: Once | INTRAMUSCULAR | Status: AC
  Administered 2020-09-22: 100 ug via INTRAVENOUS
  Filled 2020-09-22: qty 2

## 2020-09-22 MED ORDER — SODIUM CHLORIDE 0.9 % IR SOLN
Status: DC | PRN
  Administered 2020-09-22: 6000 mL

## 2020-09-22 MED ORDER — LIDOCAINE 2% (20 MG/ML) 5 ML SYRINGE
INTRAMUSCULAR | Status: AC
  Filled 2020-09-22: qty 5

## 2020-09-22 MED ORDER — FLUCONAZOLE 100 MG PO TABS
100.0000 mg | ORAL_TABLET | Freq: Every day | ORAL | Status: DC
  Filled 2020-09-22: qty 1

## 2020-09-22 MED ORDER — PROPOFOL 10 MG/ML IV BOLUS
INTRAVENOUS | Status: DC | PRN
  Administered 2020-09-22: 120 mg via INTRAVENOUS

## 2020-09-22 MED ORDER — UMECLIDINIUM BROMIDE 62.5 MCG/INH IN AEPB
1.0000 | INHALATION_SPRAY | Freq: Every day | RESPIRATORY_TRACT | Status: DC
  Administered 2020-09-23 – 2020-09-29 (×7): 1 via RESPIRATORY_TRACT
  Filled 2020-09-22 (×2): qty 7

## 2020-09-22 MED ORDER — ARFORMOTEROL TARTRATE 15 MCG/2ML IN NEBU: 15.0000 ug | INHALATION_SOLUTION | Freq: Two times a day (BID) | RESPIRATORY_TRACT | Status: DC

## 2020-09-22 MED ORDER — DOCUSATE SODIUM 100 MG PO CAPS
100.0000 mg | ORAL_CAPSULE | Freq: Two times a day (BID) | ORAL | Status: DC
  Administered 2020-09-23 – 2020-09-30 (×7): 100 mg via ORAL
  Filled 2020-09-22 (×11): qty 1

## 2020-09-22 MED ORDER — ALBUTEROL SULFATE HFA 108 (90 BASE) MCG/ACT IN AERS
INHALATION_SPRAY | RESPIRATORY_TRACT | Status: DC | PRN
  Administered 2020-09-22: 4 via RESPIRATORY_TRACT

## 2020-09-22 MED ORDER — PHENYLEPHRINE 40 MCG/ML (10ML) SYRINGE FOR IV PUSH (FOR BLOOD PRESSURE SUPPORT)
PREFILLED_SYRINGE | INTRAVENOUS | Status: DC | PRN
  Administered 2020-09-22: 200 ug via INTRAVENOUS

## 2020-09-22 MED ORDER — DEXAMETHASONE SODIUM PHOSPHATE 10 MG/ML IJ SOLN
INTRAMUSCULAR | Status: AC
  Filled 2020-09-22: qty 1

## 2020-09-22 MED ORDER — FENTANYL CITRATE (PF) 100 MCG/2ML IJ SOLN
50.0000 ug | Freq: Once | INTRAMUSCULAR | Status: AC
Start: 2020-09-22 — End: 2020-09-22
  Administered 2020-09-22: 50 ug via INTRAVENOUS
  Filled 2020-09-22: qty 2

## 2020-09-22 MED ORDER — ACETAMINOPHEN 10 MG/ML IV SOLN
INTRAVENOUS | Status: DC | PRN
  Administered 2020-09-22: 1000 mg via INTRAVENOUS

## 2020-09-22 MED ORDER — POTASSIUM CHLORIDE IN NACL 20-0.9 MEQ/L-% IV SOLN
INTRAVENOUS | Status: DC
  Filled 2020-09-22: qty 1000

## 2020-09-22 MED ORDER — BISACODYL 10 MG RE SUPP: 10.0000 mg | Freq: Every day | RECTAL | Status: DC | PRN

## 2020-09-22 MED ORDER — ALBUTEROL SULFATE (2.5 MG/3ML) 0.083% IN NEBU
2.5000 mg | INHALATION_SOLUTION | RESPIRATORY_TRACT | Status: DC | PRN
  Administered 2020-09-23: 2.5 mg via RESPIRATORY_TRACT
  Filled 2020-09-22: qty 3

## 2020-09-22 MED ORDER — FENTANYL CITRATE (PF) 100 MCG/2ML IJ SOLN
INTRAMUSCULAR | Status: AC
  Filled 2020-09-22: qty 2

## 2020-09-22 MED ORDER — FLUCONAZOLE 100 MG PO TABS: 100.0000 mg | ORAL_TABLET | Freq: Once | ORAL | Status: DC

## 2020-09-22 MED ORDER — ONDANSETRON HCL 4 MG/2ML IJ SOLN
INTRAMUSCULAR | Status: AC
  Filled 2020-09-22: qty 2

## 2020-09-22 MED ORDER — DEXAMETHASONE SODIUM PHOSPHATE 10 MG/ML IJ SOLN
INTRAMUSCULAR | Status: DC | PRN
  Administered 2020-09-22: 8 mg via INTRAVENOUS

## 2020-09-22 MED ORDER — ONDANSETRON HCL 4 MG/2ML IJ SOLN: 4.0000 mg | Freq: Four times a day (QID) | INTRAMUSCULAR | Status: DC | PRN

## 2020-09-22 MED ORDER — POLYETHYLENE GLYCOL 3350 17 G PO PACK: 17.0000 g | PACK | Freq: Every day | ORAL | Status: DC | PRN

## 2020-09-22 MED ORDER — VANCOMYCIN HCL IN DEXTROSE 1-5 GM/200ML-% IV SOLN
1000.0000 mg | INTRAVENOUS | Status: AC
  Administered 2020-09-22: 1000 mg via INTRAVENOUS

## 2020-09-22 MED ORDER — ADULT MULTIVITAMIN W/MINERALS CH
1.0000 | ORAL_TABLET | Freq: Every day | ORAL | Status: DC
  Administered 2020-09-23 – 2020-09-30 (×8): 1 via ORAL
  Filled 2020-09-22 (×9): qty 1

## 2020-09-22 SURGICAL SUPPLY — 43 items
BAG COUNTER SPONGE SURGICOUNT (BAG) IMPLANT
BAG SPNG CNTER NS LX DISP (BAG)
BNDG CMPR 9X4 STRL LF SNTH (GAUZE/BANDAGES/DRESSINGS) ×1
BNDG COHESIVE 4X5 TAN ST LF (GAUZE/BANDAGES/DRESSINGS) ×2 IMPLANT
BNDG ELASTIC 4X5.8 VLCR STR LF (GAUZE/BANDAGES/DRESSINGS) ×2 IMPLANT
BNDG ELASTIC 6X5.8 VLCR STR LF (GAUZE/BANDAGES/DRESSINGS) ×1 IMPLANT
BNDG ESMARK 4X9 LF (GAUZE/BANDAGES/DRESSINGS) ×2 IMPLANT
BOOTIES KNEE HIGH SLOAN (MISCELLANEOUS) ×4 IMPLANT
COVER SURGICAL LIGHT HANDLE (MISCELLANEOUS) ×2 IMPLANT
DRAPE POUCH INSTRU U-SHP 10X18 (DRAPES) ×2 IMPLANT
DRSG KUZMA FLUFF (GAUZE/BANDAGES/DRESSINGS) ×1 IMPLANT
DRSG PAD ABDOMINAL 8X10 ST (GAUZE/BANDAGES/DRESSINGS) ×3 IMPLANT
DURAPREP 26ML APPLICATOR (WOUND CARE) ×2 IMPLANT
ELECT REM PT RETURN 15FT ADLT (MISCELLANEOUS) ×2 IMPLANT
GAUZE PACKING IODOFORM 1/4X15 (PACKING) ×1 IMPLANT
GAUZE SPONGE 4X4 12PLY STRL (GAUZE/BANDAGES/DRESSINGS) ×2 IMPLANT
GAUZE XEROFORM 1X8 LF (GAUZE/BANDAGES/DRESSINGS) ×2 IMPLANT
GLOVE SRG 8 PF TXTR STRL LF DI (GLOVE) ×1 IMPLANT
GLOVE SURG ENC MOIS LTX SZ7 (GLOVE) ×2 IMPLANT
GLOVE SURG POLYISO LF SZ7.5 (GLOVE) ×2 IMPLANT
GLOVE SURG UNDER POLY LF SZ7 (GLOVE) ×2 IMPLANT
GLOVE SURG UNDER POLY LF SZ8 (GLOVE) ×2
GOWN STRL REUS W/TWL LRG LVL3 (GOWN DISPOSABLE) ×4 IMPLANT
KIT BASIN OR (CUSTOM PROCEDURE TRAY) ×2 IMPLANT
KIT TURNOVER KIT A (KITS) ×2 IMPLANT
MANIFOLD NEPTUNE II (INSTRUMENTS) ×2 IMPLANT
NEEDLE HYPO 22GX1.5 SAFETY (NEEDLE) ×2 IMPLANT
PACK ORTHO EXTREMITY (CUSTOM PROCEDURE TRAY) ×2 IMPLANT
PAD CAST 4YDX4 CTTN HI CHSV (CAST SUPPLIES) ×2 IMPLANT
PADDING CAST COTTON 4X4 STRL (CAST SUPPLIES) ×4
PADDING CAST SYN 6 (CAST SUPPLIES) ×1
PADDING CAST SYNTHETIC 4 (CAST SUPPLIES) ×1
PADDING CAST SYNTHETIC 4X4 STR (CAST SUPPLIES) IMPLANT
PADDING CAST SYNTHETIC 6X4 NS (CAST SUPPLIES) IMPLANT
PENCIL SMOKE EVACUATOR (MISCELLANEOUS) ×1 IMPLANT
PROTECTOR NERVE ULNAR (MISCELLANEOUS) ×2 IMPLANT
SPLINT FIBERGLASS 4X30 (CAST SUPPLIES) ×1 IMPLANT
SUT ETHILON 4 0 PS 2 18 (SUTURE) ×1 IMPLANT
SUT NYLON 3 0 (SUTURE) ×4 IMPLANT
SWAB COLLECTION DEVICE MRSA (MISCELLANEOUS) IMPLANT
SWAB CULTURE ESWAB REG 1ML (MISCELLANEOUS) IMPLANT
SYR CONTROL 10ML LL (SYRINGE) ×1 IMPLANT
TOWEL OR 17X26 10 PK STRL BLUE (TOWEL DISPOSABLE) ×2 IMPLANT

## 2020-09-22 NOTE — ED Triage Notes (Signed)
I week of rt hand pain and swelling, cyst on base of middle finger causing pain and swelling

## 2020-09-22 NOTE — Progress Notes (Addendum)
Pharmacy Antibiotic Note  Ebony Scott is a 70 y.o. female admitted on 09/22/2020 with  hand infection .  Pharmacy has been consulted for Vancomycin & Unasyn dosing. Also on Diflucan for previous cx + yeast. S/p I&D 8/4 Scr mildly elevated above patient's baseline **PCN allergy noted** Patient can tolerate cephalosporins.   Plan: Rocephin + Flagyl x1 tonight as discussed with hospitalist house coverage.  Will clarify with ortho tomorrow for ongoing gram negative/anaerobic abx coverage.  Vancomycin 1gm IV q48h to target AUC 400-550 Check Vancomycin levels at steady state Monitor renal function and cx data   Height: '5\' 1"'$  (154.9 cm) Weight: 63.5 kg (140 lb) IBW/kg (Calculated) : 47.8  Temp (24hrs), Avg:98 F (36.7 C), Min:97.5 F (36.4 C), Max:98.4 F (36.9 C)  Recent Labs  Lab 09/22/20 1302  WBC 14.5*  CREATININE 1.59*  LATICACIDVEN 1.6    Estimated Creatinine Clearance: 27.3 mL/min (A) (by C-G formula based on SCr of 1.59 mg/dL (H)).    Allergies  Allergen Reactions   Baclofen Other (See Comments)    Altered mental status requiring a 3-day hospital stay- patient became unresponsive   Dust Mite Extract Shortness Of Breath and Other (See Comments)    "sneezing" (02/20/2012)   Molds & Smuts Shortness Of Breath   Morphine And Related Hives and Itching   Other Shortness Of Breath and Other (See Comments)    Grass and weeds "sneezing; filled sinuses" (02/20/2012)   Penicillins Rash and Other (See Comments)    "welts" (02/20/2012) Has patient had a PCN reaction causing immediate rash, facial/tongue/throat swelling, SOB or lightheadedness with hypotension: Unknown Has patient had a PCN reaction causing severe rash involving mucus membranes or skin necrosis: No Has patient had a PCN reaction that required hospitalization: No Has patient had a PCN reaction occurring within the last 10 years: No If all of the above answers are "NO", then may proceed with Cephalosporin use. Tolerated  Ancef 02/16/20.    Rofecoxib Swelling    Vioxx REACTION: feet swelling Other reaction(s): Unknown Other reaction(s): Unknown   Shellfish Allergy Anaphylaxis, Shortness Of Breath, Itching and Rash   Shrimp Flavor Anaphylaxis and Other (See Comments)    ALL SHELLFISH, also   Tetracycline Hcl Nausea And Vomiting   Xolair [Omalizumab] Other (See Comments)    Caused Blood clot   Zoledronic Acid Other (See Comments)    Fever, Put in hospital, dr said it was a reaction from a reaction  Other reaction(s): Unknown   Dilaudid [Hydromorphone Hcl] Itching   Hydrocodone-Acetaminophen Nausea And Vomiting   Levofloxacin Other (See Comments)    GI upset Other reaction(s): Unknown   Milk-Related Compounds Other (See Comments)    "I'm lactose intolerant" (02/20/2012) Other reaction(s): Unknown   Oxycodone Hcl Nausea And Vomiting   Paroxetine Nausea And Vomiting    Paxil    Celecoxib Swelling    Feet swelling   Diltiazem Swelling   Diltiazem Hcl     Other reaction(s): Unknown   Eggs Or Egg-Derived Products Other (See Comments)    "can eat fried egg q once in awhile; allergist told me I'm allergic; never had a reaction after eating an egg" (02/20/2012)   Hydrocodone-Acetaminophen     Other reaction(s): Unknown   Lactose Intolerance (Gi) Other (See Comments)    Bloating and gas   Morphine Sulfate Other (See Comments)    "Gets loopy"   Oxycodone-Acetaminophen     Other reaction(s): Unknown   Penicillin G Procaine     Other  reaction(s): Unknown   Strawberry Flavor    Tree Extract Other (See Comments)    "tested and told I was allergic to it; never experienced a reaction to it" (02/20/2012)    Antimicrobials this admission: 8/4 Vancomycin >>  8/5  8/4 Ancef x1 8/4 Diflucan>>  Dose adjustments this admission:  Microbiology results: 8/4 BCx:  8/4 Body fluid cx (R hand): 8/4 Tissue Cx (synovial R elbow) 8/4 Fungal Cx (synovial R elbow):  8/4 Acid Fast Cx (Syn fl R elbow):   Thank you  for allowing pharmacy to be a part of this patient's care.  Netta Cedars PharmD 09/22/2020 11:56 PM

## 2020-09-22 NOTE — Transfer of Care (Signed)
Immediate Anesthesia Transfer of Care Note  Patient: Ebony Scott  Procedure(s) Performed: IRRIGATION AND DEBRIDEMENT RIGHT HAND AND ELBOW (Right: Arm Lower)  Patient Location: PACU  Anesthesia Type:General  Level of Consciousness: awake, alert  and oriented  Airway & Oxygen Therapy: Patient Spontanous Breathing and Patient connected to face mask  Post-op Assessment: Report given to RN and Post -op Vital signs reviewed and stable  Post vital signs: Reviewed and stable  Last Vitals:  Vitals Value Taken Time  BP 168/88 09/22/20 2215  Temp    Pulse 90 09/22/20 2216  Resp 23 09/22/20 2216  SpO2 93 % 09/22/20 2216  Vitals shown include unvalidated device data.  Last Pain:  Vitals:   09/22/20 1607  TempSrc:   PainSc: 5          Complications: No notable events documented.

## 2020-09-22 NOTE — Anesthesia Procedure Notes (Signed)
Procedure Name: Intubation Date/Time: 09/22/2020 8:42 PM Performed by: Rosaland Lao, CRNA Pre-anesthesia Checklist: Patient identified, Emergency Drugs available, Suction available and Patient being monitored Patient Re-evaluated:Patient Re-evaluated prior to induction Oxygen Delivery Method: Circle system utilized Preoxygenation: Pre-oxygenation with 100% oxygen Induction Type: IV induction and Rapid sequence Laryngoscope Size: Glidescope and 3 Grade View: Grade I Tube type: Oral Tube size: 7.0 mm Number of attempts: 1 Airway Equipment and Method: Stylet Placement Confirmation: ETT inserted through vocal cords under direct vision, positive ETCO2 and breath sounds checked- equal and bilateral Secured at: 22 cm Tube secured with: Tape Dental Injury: Teeth and Oropharynx as per pre-operative assessment

## 2020-09-22 NOTE — ED Notes (Signed)
DNR sticker on armband.

## 2020-09-22 NOTE — ED Provider Notes (Signed)
[facility] DEPT Provider Note   CSN: [id number] Arrival date & time: 09/22/20  1156     History Chief Complaint  Patient presents with   Hand Pain    Ebony Scott is a 70 y.o. 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  Patient states she has been having the cyst drained to Dr. Luanna Cole office x3 so far with most recently 3 weeks ago.  Presented today for other orthopedic concerns and requested drainage, however given their concern for infection on exam she was directed to the emergency department.  According to the patient she was told she will need to be admitted to the hospital and undergo hand surgery consultation, per her orthopedic provider today.  Does have an appointment scheduled with Dr. Burney Gauze in 2 weeks.   I personally read the patient medical records.  She has extensive medical history including peripheral vascular disease rheumatoid arthritis moderate persistent asthma and remote history of breast cancer now in remission.  History of DVT anticoagulated on Xarelto. She is accompanied by her caretaker in the emergency department.  HPI     Past Medical History:  Diagnosis Date   Acute deep vein thrombosis (DVT) of right lower extremity (HCC) 12/13/2017   Allergic rhinitis    Allergy    SEASONAL   Anemia    Angio-edema    Anxiety    pt denies   Arthritis    Phreesia 10/21/2019   Asthma    Asthma    Phreesia 10/21/2019   Breast cancer ([place]) 1998   in remission   Cataract    REMOVED   Clostridium difficile colitis 01/14/2018   Clostridium difficile diarrhea 78/29/5621   Complication of anesthesia    "had hard time waking up from it several times" (02/20/2012)   Depression    "some; don't take anything for it" (02/20/2012)    Diverticulosis    DVT (deep venous thrombosis) (HCC)    Exertional dyspnea    Fibromyalgia 11/2011   GERD (gastroesophageal reflux disease)    Graves disease    Headache(784.0)    "related to allergies; more at different times during the year" (02/20/2012)   Hemorrhoids    Hiatal hernia    back and neck   Hx of adenomatous colonic polyps 04/12/2016   Hypercholesteremia    good cholesterol is high   Hypothyroidism    IBS (irritable bowel syndrome)    Moderate persistent asthma    -FeV1 72% 2011, -IgE 102 2011, CT sinus Neg 2011   Osteoporosis    on reclast yearly   Peripheral vascular disease ([place]) 2019   DVTs   Pneumonia 04/2011; ~ 11/2011   "double; single" (02/20/2012)   Recurrent upper respiratory infection (URI)    Seronegative rheumatoid arthritis ([place])    Dr. Lahoma Rocker   SIRS (systemic inflammatory response syndrome) ([place]) 02/10/2018   Tracheobronchomalacia     Patient Active Problem List   Diagnosis Date Noted   Cellulitis of right hand 30/86/5784   Metabolic encephalopathy 69/62/9528   Acute encephalopathy    AMS (altered mental status) 02/23/2020   S/P reverse total shoulder arthroplasty, right    Hypokalemia    DOE (dyspnea on exertion) 02/18/2020   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   S/P reverse total shoulder arthroplasty,  left 02/16/2020   Preoperative clearance 01/26/2020   Closed displaced fracture of metatarsal bone of right foot 01/20/2019   History of pulmonary embolus (PE) 12/19/2018   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   Rheumatoid arthritis ([place])    Bilateral impacted cerumen 08/18/2018   Fungal otitis externa 08/18/2018   Asthmatic bronchitis with exacerbation 07/29/2018   Pulmonary embolism ([place]) 07/29/2018   Cellulitis of forearm    Fever of unknown origin (FUO) 02/10/2018   Abdominal pain 02/10/2018   Fracture of base of fifth metatarsal bone with routine healing, left    Closed fracture of fifth metatarsal bone of right foot,  initial encounter 01/15/2018   DVT (deep venous thrombosis) ([place]) 01/14/2018   Hypothyroidism 01/14/2018   Depression 01/14/2018   Debility    Malnutrition of moderate degree 12/17/2017   Knee pain    History of breast cancer    Chronic pain syndrome    Fibromyalgia    Graves disease    Sepsis due to undetermined organism ([place]) 12/13/2017   Rhabdomyolysis 12/13/2017   Dysphonia 08/10/2017   Multiple lung nodules on CT    CKD (chronic kidney disease), stage III ([place]) 04/15/2016   Hx of adenomatous colonic polyps 04/12/2016   Cough 02/09/2016   Opacity of lung on imaging study 11/03/2015   Severe persistent asthma 02/08/2015   Facial pain syndrome 02/08/2015   Insomnia 02/08/2015   Other allergic rhinitis 02/08/2015   LPRD (laryngopharyngeal reflux disease) 02/08/2015   Chronic diastolic CHF (congestive heart failure) ([place]) 02/08/2015   Benign paroxysmal positional vertigo 12/03/2012   Dizziness and giddiness 10/29/2012   Disequilibrium 10/29/2012   Pseudogout 02/24/2012   Pneumonia 05/31/2011   Irritable bowel syndrome 01/02/2010   Hyperlipidemia 12/23/2009   Hyperthyroidism 11/12/2006   Seasonal and perennial allergic rhinitis 11/12/2006   GERD 11/12/2006   NECK PAIN, CHRONIC 11/12/2006   OSTEOPOROSIS 11/12/2006   BREAST CANCER, HX OF 11/12/2006    Past Surgical History:  Procedure Laterality Date   ABDOMINAL HYSTERECTOMY N/A    Phreesia 10/21/2019   ANTERIOR AND POSTERIOR REPAIR  1990's   APPENDECTOMY     BREAST LUMPECTOMY  1998   left   BREAST SURGERY N/A    Phreesia 10/21/2019   BRONCHIAL WASHINGS  04/05/2020   Procedure: BRONCHIAL WASHINGS;  Surgeon: Freddi Starr, MD;  Location: [facility];  Service: Pulmonary;;   CARPOMETACARPEL ([place]) FUSION OF THUMB WITH AUTOGRAFT FROM RADIUS  ~ 2009   "both thumbs" (02/20/2012)   CATARACT EXTRACTION W/ INTRAOCULAR LENS  IMPLANT, BILATERAL  2012   CERVICAL DISCECTOMY  10/2001   C5-C6   CERVICAL FUSION  2003   C3-C4    CHOLECYSTECTOMY     COLONOSCOPY     DEBRIDEMENT TENNIS ELBOW  ?1970's   right   ESOPHAGOGASTRODUODENOSCOPY     EYE SURGERY N/A    Phreesia 10/21/2019   HYSTERECTOMY     KNEE ARTHROPLASTY  ?1990's   "?right; w/cartilage repair" (02/20/2012)   NASAL SEPTUM SURGERY  1980's   POSTERIOR CERVICAL FUSION/FORAMINOTOMY  2004   "failed initial fusion; rewired  anterior neck" (02/20/2012)   REVERSE SHOULDER ARTHROPLASTY Right 02/16/2020   Procedure: REVERSE SHOULDER ARTHROPLASTY;  Surgeon: Marchia Bond, MD;  Location: [facility];  Service: Orthopedics;  Laterality: Right;   SPINE SURGERY N/A    Phreesia 10/21/2019   TONSILLECTOMY  ~ 1953   VESICOVAGINAL FISTULA CLOSURE W/ TAH  1988   VIDEO BRONCHOSCOPY Bilateral 08/23/2016   Procedure: VIDEO BRONCHOSCOPY WITH FLUORO;  Surgeon: Javier Glazier,  MD;  Location: [facility];  Service: Cardiopulmonary;  Laterality: Bilateral;   VIDEO BRONCHOSCOPY N/A 04/05/2020   Procedure: VIDEO BRONCHOSCOPY WITHOUT FLUORO;  Surgeon: Freddi Starr, MD;  Location: [facility];  Service: Pulmonary;  Laterality: N/A;     OB History   No obstetric history on file.     Family History  Problem Relation Age of Onset   Allergies Mother    Heart disease Mother    Arthritis Mother    Lung cancer Mother    Diabetes Mother    Allergies Father    Heart disease Father    Arthritis Father    Stroke Father    Colon cancer Other        Maternal half aunt/Maternal half uncle   Colitis Daughter    Diabetes Maternal Grandfather     Social History   Tobacco Use   Smoking status: Never    Passive exposure: Yes   Smokeless tobacco: Never   Tobacco comments:    Parents  Vaping Use   Vaping Use: Never used  Substance Use Topics   Alcohol use: Not Currently    Alcohol/week: 0.0 standard drinks   Drug use: No    Home Medications Prior to Admission medications   Medication Sig Start Date End Date Taking? Authorizing Provider  acetaminophen (TYLENOL) 650 MG  CR tablet Take 1,300 mg by mouth every 8 (eight) hours as needed for pain.    [provider]  albuterol (PROVENTIL) (2.5 MG/3ML) 0.083% nebulizer solution USE 1 AMPULE IN NEBULIZER IN AM AT BEDTIME ALONG WITH FLUTTER VALVE Patient taking differently: Take 2.5 mg by nebulization See admin instructions. USE 1 AMPULE IN NEBULIZER IN AM AT BEDTIME ALONG WITH FLUTTER VALVE 08/08/20   Freddi Starr, MD  albuterol (VENTOLIN HFA) 108 (90 Base) MCG/ACT inhaler INHALE 2 PUFFS BY MOUTH INTO THE LUNGS EVERY 4 HOURS AS NEEDED FOR WHEEZING OR SHORTNESS OF BREATH 07/04/20   Freddi Starr, MD  allopurinol (ZYLOPRIM) 100 MG tablet Take 100 mg by mouth daily. 11/19/19   [provider]  Alpha-D-Galactosidase (BEANO PO) Take 2-3 capsules by mouth 3 (three) times daily with meals.    [provider]  Alpha-Lipoic Acid 600 MG CAPS Take 600 mg by mouth daily.     [provider]  ammonium lactate (AMLACTIN) 12 % cream Apply topically as needed for dry skin. Patient taking differently: Apply 1 g topically at bedtime. 07/30/19   Trula Slade, DPM  atorvastatin (LIPITOR) 10 MG tablet TAKE 1 TABLET BY MOUTH DAILY 03/16/20   [name], MD  azelastine (ASTELIN) 0.1 % nasal spray USE 2 SPRAYS IN EACH NOSTRIL DAILY AS DIRECTED Patient taking differently: Place 2 sprays into both nostrils daily as needed for allergies. 01/02/19   Kozlow, Donnamarie Poag, MD  benzonatate (TESSALON) 200 MG capsule TAKE 1 CAPSULE BY MOUTH TWICE DAILY AS NEEDED FOR COUGH 05/26/20   [name], MD  Blood Glucose Monitoring Suppl (BLOOD GLUCOSE SYSTEM PAK) KIT Please dispense per patient and insurance preference. Use as directed to monitor FSBS 4x . Dx: E11.65 09/08/20   [name], MD  Calcium Citrate (CITRACAL PO) Take 1 tablet by mouth in the morning and at bedtime.    [provider]  CALCIUM-MAGNESIUM PO Take 1 tablet by mouth 2 (two) times daily.    [provider]   cetirizine (ZYRTEC) 10 MG tablet Take 10 mg by mouth daily.    [provider]  cholecalciferol (VITAMIN D) 25 MCG (1000 UT) tablet Take 1,000 Units by mouth daily.     [provider]  Continuous Blood Gluc Receiver (FREESTYLE LIBRE 14 DAY READER) DEVI Apply 1 each topically daily as needed. USE AS DIRECTED TO MONITOR BLOOD GLUCOSE LEVELS. DX: E11.9. 09/16/20   [name], MD  Continuous Blood Gluc Sensor (FREESTYLE LIBRE 14 DAY SENSOR) MISC Apply 1 each topically every 14 (fourteen) days. USE AS DIRECTED TO MONITOR BLOOD GLUCOSE LEVELS. DX: E11.9. 09/16/20   [name], MD  COVID-19 mRNA vaccine, Pfizer, 30 MCG/0.3ML injection INJECT AS DIRECTED 12/16/19 12/15/20  Carlyle Basques, MD  cyproheptadine (PERIACTIN) 4 MG tablet TAKE 2 TABLETS BY MOUTH EVERY EVENING Patient taking differently: Take 8 mg by mouth every evening. 02/02/20   [name], MD  denosumab (PROLIA) 60 MG/ML SOLN injection Inject 60 mg into the skin every 6 (six) months. Administer in upper arm, thigh, or abdomen    [provider]  DEXILANT 60 MG capsule TAKE 1 CAPSULE BY MOUTH EACH MORNING Patient taking differently: Take 60 mg by mouth daily. 02/02/20   [name], MD  diclofenac Sodium (VOLTAREN) 1 % GEL Apply 2 g topically 4 (four) times daily as needed (joint pain). 05/26/20   [name], MD  EPINEPHrine (EPIPEN 2-PAK) 0.3 mg/0.3 mL IJ SOAJ injection Inject 0.3 mLs (0.3 mg total) into the muscle Once PRN. 01/23/19   [name], MD  famotidine (PEPCID) 20 MG tablet TAKE 1 TABLET BY MOUTH TWICE A DAY Patient taking differently: Take 20 mg by mouth 2 (two) times daily. 02/01/20   Kozlow, Donnamarie Poag, MD  fluticasone (FLONASE) 50 MCG/ACT nasal spray USE 2 SPRAYS IN [facility] NOSTRIL DAILY 09/02/20   Freddi Starr, MD  folic acid (FOLVITE) 1 MG tablet Take 1 mg by mouth daily.    [provider]  furosemide (LASIX) 40 MG tablet Take 1 tablet (40 mg total) by  mouth daily. 80 mg in the AM and 40 mg in the PM 08/30/20   [name], MD  gabapentin (NEURONTIN) 600 MG tablet TAKE 1 TABLET BY MOUTH DAILY Patient taking differently: Take 600 mg by mouth at bedtime. 02/03/20   [name], MD  Glucose Blood (BLOOD GLUCOSE TEST STRIPS) STRP Please dispense per patient and insurance preference. Use as directed to monitor FSBS 4x . Dx: E11.65 09/08/20   [name], MD  guaiFENesin (MUCINEX) 600 MG 12 hr tablet Take 1 tablet (600 mg total) by mouth 2 (two) times daily as needed for cough or to loosen phlegm. 05/09/20   [name], MD  hydrocortisone 2.5 % cream Apply topically 2 (two) times daily as needed (hemorrhoids). 05/09/20   [name], MD  Lactase 9000 units TABS Take 18,000-27,000 Units by mouth 4 (four) times daily as needed (dairy consumption).    [provider]  Lancets MISC Please dispense per patient and insurance preference. Use as directed to monitor FSBS 4x . Dx: E11.65 09/08/20   [name], MD  lidocaine (LIDODERM) 5 % Place 1 patch onto the skin daily as needed (pain). Remove & Discard patch within 12 hours or as directed by MD 01/23/18   Aline August, MD  magnesium oxide (MAG-OX) 400 MG tablet Take 400 mg by mouth daily.    [provider]  metolazone (ZAROXOLYN) 5 MG tablet Take 1 tablet (5 mg total) by mouth daily. Patient not taking: Reported on 09/06/2020 08/25/20  [name], MD  mirtazapine (REMERON SOL-TAB) 30 MG disintegrating tablet DISSOLVE 1 TABLET BY MOUTH EVERY NIGHT AT BEDTIME 05/26/20   [name], MD  MITIGARE 0.6 MG CAPS Take 0.6 mg by mouth every other day. 01/16/20   [provider]  montelukast (SINGULAIR) 10 MG tablet TAKE 1 TABLET BY MOUTH DAILY 09/05/20   Kozlow, Donnamarie Poag, MD  mupirocin ointment (BACTROBAN) 2 % APPLY TO BED SORES AS NEEDED 03/21/20 03/21/21  [name], MD  Nystatin (GERHARDT'S BUTT CREAM) CREA Apply 1 application topically daily  as needed (hemorrhoids). NYSTATIN 100,000 UNIT/GM CR 100000 CRM- 60 g, ZINC OXIDE 20 % OINT 20 OINT- 60 g, HYDROCORTISONE 1 % CREAM- 60 g 03/24/20   [name], MD  nystatin (MYCOSTATIN) 100000 UNIT/ML suspension SWISH AND SPIT 5 MLS TWICE DAILY Patient taking differently: Use as directed 5 mLs in the mouth or throat at bedtime. 03/16/20   Kozlow, Donnamarie Poag, MD  nystatin cream (MYCOSTATIN) Apply 1 application topically 2 (two) times daily. 02/15/20   [provider]  nystatin-triamcinolone ointment (MYCOLOG) Apply topically 2 (two) times daily. Patient taking differently: Apply 1 application topically 2 (two) times daily as needed (yeast infections). 07/07/19   [name], MD  ondansetron (ZOFRAN) 4 MG tablet Take 1 tablet (4 mg total) by mouth every 8 (eight) hours as needed for nausea or vomiting. 02/19/20   Merlene Pulling K, PA-C  potassium chloride SA (KLOR-CON) 20 MEQ tablet Take 1 tablet (20 mEq total) by mouth daily. 08/30/20   [name], MD  predniSONE (DELTASONE) 5 MG tablet Take 15 mg by mouth daily with breakfast.    [provider]  Prenatal Vit-Fe Fumarate-FA (PRENATAL MULTIVITAMIN) TABS tablet Take 1 tablet by mouth daily.    [provider]  Probiotic Product (ALIGN PO) Take 1 capsule by mouth daily.    [provider]  Propylene Glycol (SYSTANE COMPLETE) 0.6 % SOLN Place 1 drop into both eyes 2 (two) times daily as needed (dry eyes).    [provider]  Respiratory Therapy Supplies (FLUTTER) DEVI Use as directed 10/22/14   Elsie Stain, MD  Salicylic Acid-Urea [facility] EX) Apply 1 application topically at bedtime.    [provider]  sodium chloride HYPERTONIC 3 % nebulizer solution Take by nebulization 2 (two) times daily. 02/02/20   Julian Hy, DO  Spacer/Aero-Holding Chambers (AEROCHAMBER PLUS) inhaler Use as instructed 07/29/18   Parrett, Fonnie Mu, NP  Tiotropium Bromide-Olodaterol (STIOLTO RESPIMAT) 2.5-2.5  MCG/ACT AERS Inhale 2 puffs into the lungs daily. 08/05/20   Freddi Starr, MD  XARELTO 20 MG TABS tablet TAKE 1 TABLET BY MOUTH DAILY WITH SUPPER Patient taking differently: Take 20 mg by mouth every evening. 02/02/20   [name], MD    Allergies    Dust mite extract, Molds & smuts, Morphine and related, Other, Penicillins, Reclast [zoledronic acid], Rofecoxib, Shellfish allergy, Tetracycline hcl, Xolair [omalizumab], Dilaudid [hydromorphone hcl], Hydrocodone-acetaminophen, Levofloxacin, Oxycodone hcl, Paroxetine, Baclofen, Celecoxib, Diltiazem, Lactose intolerance (gi), and Tree extract  Review of Systems   Review of Systems  Constitutional:  Positive for activity change, appetite change and chills. Negative for diaphoresis, fatigue and fever.  HENT: Negative.    Respiratory:  Positive for cough.   Cardiovascular: Negative.   Gastrointestinal:  Positive for abdominal pain, nausea and vomiting. Negative for constipation and diarrhea.  Genitourinary: Negative.   Musculoskeletal: Negative.        Hand swelling and pain/redness  Neurological: Negative.    Physical Exam Updated Vital Signs BP (!) 132/59   Pulse 90   Temp 98.4 F (36.9 C) (Oral)   Resp (!) 32   Ht $R'5\' 1"'wU$  (1.549 m)   Wt 63.5 kg   SpO2 96%   BMI 26.45 kg/m   Physical Exam Vitals and nursing note reviewed.  Constitutional:      Appearance: She is obese. She is not toxic-appearing.  HENT:     Head: Normocephalic and atraumatic.     Nose: Nose normal. No congestion.     Mouth/Throat:     Mouth: Mucous membranes are moist.     Pharynx: Oropharynx is clear. Uvula midline. No oropharyngeal exudate or posterior oropharyngeal erythema.     Tonsils: No tonsillar exudate.  Eyes:     General: Lids are normal. Vision grossly intact.        Right eye: No discharge.        Left eye: No discharge.     Extraocular Movements: Extraocular movements intact.     Conjunctiva/sclera: Conjunctivae normal.     Pupils:  Pupils are equal, round, and reactive to light.  Neck:     Trachea: Trachea and phonation normal.  Cardiovascular:     Rate and Rhythm: Normal rate and regular rhythm.     Pulses: Normal pulses.     Heart sounds: Normal heart sounds. No murmur heard. Pulmonary:     Effort: Pulmonary effort is normal. No tachypnea, bradypnea, accessory muscle usage, prolonged expiration or respiratory distress.     Breath sounds: Wheezing and rales present.  Chest:     Chest wall: No mass, lacerations, deformity, swelling, tenderness, crepitus or edema.  Abdominal:     General: Bowel sounds are normal. There is no distension.     Palpations: Abdomen is soft.     Tenderness: There is no abdominal tenderness. There is no right CVA tenderness, left CVA tenderness, guarding or rebound.  Musculoskeletal:        General: No deformity.     Right forearm: Swelling and tenderness present.     Left forearm: Normal.     Right wrist: Swelling and tenderness present. No crepitus. Decreased range of motion. Normal pulse.     Left wrist: Normal.     Right hand: Swelling and tenderness present. Decreased range of motion. Normal capillary refill. Normal pulse.     Left hand: Normal.       Hands:     Cervical back: Normal range of motion and neck supple. No edema, rigidity or crepitus. No pain with movement, spinous process tenderness or muscular tenderness.     Right lower leg: 2+ Edema present.     Left lower leg: 2+ Edema present.     Comments: Normal cap refill in all 5 digits of the right hand, 2+ radial pulse on the right.  Full range of motion of the elbow on the right, decreased range of motion of the wrist due to swelling and pain.  Impaired flexion of the digits of the right hand secondary to edema of the hand.  Able to fully extend the digits without difficulty.  Lymphadenopathy:     Cervical: No cervical adenopathy.  Skin:    General: Skin is warm and dry.     Capillary Refill: Capillary refill takes less  than 2 seconds.  Neurological:     General: No focal deficit present.     Mental Status: She is alert and oriented to person, place,  and time. Mental status is at baseline.  Psychiatric:        Mood and Affect: Mood normal.     ED Results / Procedures / Treatments   Labs (all labs ordered are listed, but only abnormal results are displayed) Labs Reviewed  COMPREHENSIVE METABOLIC PANEL - Abnormal; Notable for the following components:      Result Value   Glucose, Bld 157 (*)    BUN 29 (*)    Creatinine, Ser 1.59 (*)    Albumin 3.3 (*)    AST 53 (*)    ALT 72 (*)    GFR, Estimated 34 (*)    All other components within normal limits  CBC WITH DIFFERENTIAL/PLATELET - Abnormal; Notable for the following components:   WBC 14.5 (*)    RBC 3.53 (*)    Hemoglobin 11.7 (*)    MCV 105.7 (*)    RDW 16.7 (*)    Neutro Abs 10.6 (*)    Monocytes Absolute 2.2 (*)    Abs Immature Granulocytes 0.29 (*)    All other components within normal limits  BRAIN NATRIURETIC PEPTIDE - Abnormal; Notable for the following components:   B Natriuretic Peptide 424.3 (*)    All other components within normal limits  RESP PANEL BY RT-PCR (FLU A&B, COVID) ARPGX2  CULTURE, BLOOD (ROUTINE X 2)  CULTURE, BLOOD (ROUTINE X 2)  LACTIC ACID, PLASMA  PROTIME-INR  LACTIC ACID, PLASMA    EKG None  Radiology DG Chest Port 1 View  Result Date: 09/22/2020 CLINICAL DATA:  Questionable sepsis.  Evaluate for abnormality. EXAM: PORTABLE CHEST 1 VIEW COMPARISON:  Chest CT 08/23/20 FINDINGS: Electronic device is positioned over the right upper lobe which precludes evaluation of this area. Stable cardiac enlargement. No pleural effusion or edema. No airspace opacities identified. Remote left posterior rib fractures. Previous right shoulder arthroplasty. IMPRESSION: No acute cardiopulmonary abnormalities. Electronically Signed   By: Kerby Moors M.D.   On: 09/22/2020 14:23   DG Hand Complete Right  Result Date:  09/22/2020 CLINICAL DATA:  Questionable sepsis - evaluate for abnormality EXAM: RIGHT HAND - COMPLETE 3+ VIEW COMPARISON:  Radiograph 05/25/2020 FINDINGS: There is no acute fracture or dislocation. Prior resection of the trapezium. Mild radiocarpal and intercarpal joint degenerative change. There is volar subluxation of the middle finger MCP joint and possibly the index finger MCP joint. There is moderate-severe interphalangeal joint degenerative change, worst at the proximal phalangeal joints of the third through fifth digits. There is prominent dorsal soft tissue swelling. There are soft tissue calcifications along the thumb and heterotopic ossification along the volar soft tissues adjacent to the fourth metacarpal, unchanged from prior exam. There is no new osseous erosion or destruction. IMPRESSION: Prominent dorsal soft tissue swelling. Scattered osteoarthritis with prior trapeziectomy. Volar subluxation of the third MCP joint and possibly the index finger MCP joint which may be chronic. No radiographic evidence of osteomyelitis. Electronically Signed   By: Maurine Simmering   On: 09/22/2020 14:29    Procedures Procedures   Medications Ordered in ED Medications  albuterol (VENTOLIN HFA) 108 (90 Base) MCG/ACT inhaler 2 puff (has no administration in time range)  aerochamber Z-Stat Plus/medium 1 each (has no administration in time range)  fluconazole (DIFLUCAN) tablet 100 mg (has no administration in time range)  fluconazole (DIFLUCAN) IVPB 100 mg (has no administration in time range)  ceFAZolin (ANCEF) IVPB 1 g/50 mL premix (0 g Intravenous Stopped 09/22/20 1455)  fentaNYL (SUBLIMAZE) injection 50 mcg (50 mcg Intravenous  Given 09/22/20 1351)  fentaNYL (SUBLIMAZE) injection 100 mcg (100 mcg Intravenous Given 09/22/20 1500)    ED Course  I have reviewed the triage vital signs and the nursing notes.  Pertinent labs & imaging results that were available during my care of the patient were reviewed by me and  considered in my medical decision making (see chart for details).  Clinical Course as of 09/22/20 1555  Thu Sep 22, 2020  1315 Consult to hand surgery APP, Hilbert Odor, PA-C who will discuss this case with on-call hand provider and return call with any further recommendations.  Appreciate his collaboration in the care of this patient. [RS]  1425 Patient is on CPAP with 2 L submental oxygen at nighttime.  Was found to be hypoxic to 88% on room air with a normal [place] in the emergency department.  Was placed on 2 L submental oxygen by nasal cannula by this provider.  RN aware. [RS]  1500 Consult again from orthopedic APP Hilbert Odor, PA-C, who states that patient will be taken to the OR for washout this evening at 7 PM by Dr. Mardelle Matte.  Orthopedics requesting medicine admission.  Additionally patient has grown yeast from this cyst in the past, so Ortho recommends administration of antifungals through the IV as well. I appreciate their collaboration in the care of this patient.  [RS]  1509 Pharmacist contacted regarding recommendations for antifungal coverage given patient's history of candidal cultures from ganglionic cyst.  Pharmacist to call back with recommendations after further research.  I appreciate his collaboration in the care of this patient.  [RS]  1510 Consult to hospitalist, Dr. Marylyn Ishihara, was agreeable to admitting this patient to his service.  Appreciate his collaboration in the care of this patient. [RS]  1530 Call received back from pharmacist who recommends proceeding with fluconazole 100 mg p.o. once daily x14 days to treat Candidal strain that was previously cultured in her wound.  May need to adjust dosing pending repeat culture from washout today.  I appreciate his collaboration care of this patient.  Consult ordered. [RS]    Clinical Course User Index [RS] Jarrell Armond, Gypsy Balsam, PA-C   MDM Rules/Calculators/A&P                         69 year old female with multiple chronic  medical conditions who presents with concern for hand pain redness and swelling x3 days.  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  Differential diagnosis includes is limited to noninfectious ganglionic cyst, cellulitis, cellulitis with abscess, erysipelas, sepsis, necrotizing soft tissue infection.  Vital signs are normal on intake.  Cardiac exam is normal, pulmonary exam with decreased air movement and prolonged expiration.  Abdominal exam is benign.  Musculoskeletal exam with erythema, redness, edema, and tenderness to palpation of the entire right hand extending up to the elbow.  This provider did place marking pen for observation of erythema.  Patient also in postoperative shoe in the right foot for fractured toe.  CBC with leukocytosis of 14.5, mild anemia with hemoglobin of 11.7.  CMP with baseline kidney function with creatinine of 1.59 and GFR of 30.  Transaminitis with AST/ALT 53/72, chronic per chart review.  Coags are normal as patient has missed her last few doses of Xarelto due to feeling nauseous.  Lactic acid is normal.    Respiratory pathogen panel is negative.  Chest x-ray negative for acute cardiopulmonary disease.  Plain film of the hand which did reveal  prominent dorsal soft tissue swelling with osteoarthritis and some chronic changes but no evidence of osteomyelitis.  Case discussed with orthopedics as above.  Patient did receive Ancef in the emergency department is receiving fluconazole at this time.  Patient to go to the OR for washout at 7 PM.  n.p.o.  Vernelle voiced understanding of her medical evaluation and treatment plan.  Each of her questions was answered to her expressed satisfaction.  She is amenable to plan for admission at this time.  She has been admitted to the hospital medicine service per above.  I appreciate you team collaboration in the care of this patient.  This chart was dictated using voice recognition software, Dragon. Despite the best  efforts of this provider to proofread and correct errors, errors may still occur which can change documentation meaning.  Final Clinical Impression(s) / ED Diagnoses Final diagnoses:  None    Rx / DC Orders ED Discharge Orders     None        Aura Dials 09/22/20 1555    Godfrey Pick, MD 09/23/20 [phone]

## 2020-09-22 NOTE — Op Note (Signed)
09/22/2020  9:59 PM  PATIENT:  Ebony Scott    PRE-OPERATIVE DIAGNOSIS: Right dorsal infected rheumatoid nodule, hand, overlying the third metacarpal phalangeal joint. Septic right olecranon bursitis  POST-OPERATIVE DIAGNOSIS:  Same  PROCEDURE:    1.  Excisional debridement, right olecranon bursa with olecranon bursectomy and irrigation and debridement 2.  Excisional debridement, right dorsal rheumatoid nodule, overlying the long metacarpal phalangeal joint with irrigation and debridement  SURGEON:  Johnny Bridge, MD  PHYSICIAN ASSISTANT: None  ANESTHESIA:   General  PREOPERATIVE INDICATIONS:  Ebony Scott is a  70 y.o. female who has severe rheumatoid arthritis, and had a right olecranon bursitis and right dorsal hand nodule that we were attempting to manage nonsurgically in the office with aspirations, and she had initially grown out yeast, but then ultimately ended up with streaking cellulitis up the arm and worsening signs of infection and presented to the emergency room and elected for urgent surgical management.  The risks benefits and alternatives were discussed with the patient preoperatively including but not limited to the risks of infection, bleeding, nerve injury, cardiopulmonary complications, the need for revision surgery, among others, and the patient was willing to proceed.  We also discussed the risks for the potential need for further surgical intervention.  ESTIMATED BLOOD LOSS: Minimal  OPERATIVE IMPLANTS: Iodoform packing gauze in both the elbow and the dorsal hand  OPERATIVE FINDINGS: There is an unusual tubular structure directly on the dorsum of the olecranon, I think it was simply a strand of bursa, it did not stimulate with the Bovie, and the ulnar nerve felt to be in its groove around the corner.  There was significant purulence in both the olecranon bursa extending down overlying the tendon of the triceps, but I did not feel it tracking deeper down into the  joint.  It did track a little bit laterally towards where she had a previous incision from a previous tennis elbow surgery.  There was a substantial amount of yellow gelatinous material present in the rheumatoid nodule, although I did not find that it tracked down into the joint.  I had a good overlying extensor tendon mechanism and joint capsule, and so I did not do an arthrotomy.  OPERATIVE PROCEDURE: The patient was brought to the operating room and placed in the supine position.  General anesthesia was administered.  The right upper extremity was prepped and draped in usual sterile fashion.  Timeout performed.  Dorsal incision over the olecranon bursa was carried out and the olecranon bursa was identified, with fluid sent for gram stain culture and sensitivity including yeast and fungus, I sent both fluid and tissue.  The olecranon bursa was excised using a scalpel as well as a rongeur and scissors.  I irrigated a total of 3 L through the wound, and then closed the tissue directly overlying the olecranon leaving the proximal aspect of the wound open for drainage and packed with iodoform gauze.  I also injected some Marcaine for postoperative pain control.  I then turned my attention to the dorsal hand, curvilinear incision was made overlying the metacarpal phalangeal joint, and I immediately encountered a substantial amount of yellow liquid gelatinous type material, and a substantial amount of subcutaneous thickening of nodular character, which I excised sharply.  I used a 3 cm incision.  I explored proximally and distally as well as to the depth of the wound, and it did not appear to go any deeper than the extensor mechanism, so I did  not perform an arthrotomy.  I irrigated another 3 L of fluid through this wound, and then repaired the skin with nylon.  There was a slight skin tear because of the severe delicate nature of her skin, and even just with gentle use of a skin hook, there was some disruption.   I was able to repair this with nylon.  The wound was closed loosely distally and packed with iodoform gauze and also injected with 6 cc of Marcaine.  Sterile gauze was applied followed by a long posterior splint.  She was awakened and returned to the PACU in stable and satisfactory condition.  Tourniquet time was approximately 44 minutes.  There were no complications and she tolerated the procedure well.  She will be on IV antibiotics, and we will likely need to get infectious disease involved for assistance given the extended duration as well as unusual nature of finding yeast in the aspirate from the office.  I am not sure if she will need a PICC line or not.  Debridement type: Excisional Debridement  Side: right  Body Location: Olecranon bursa  Tools used for debridement: scalpel, scissors, and rongeur  Pre-debridement Wound size (cm):   Length: 0        width: 0     depth: 0  Post-debridement Wound size (cm):   Length: 3 cm incision that was closed        width: 0     depth: 0  Debridement depth beyond dead/damaged tissue down to healthy viable tissue: yes  Tissue layer involved: skin, subcutaneous tissue, muscle / fascia  Nature of tissue removed: Slough, Necrotic, Non-viable tissue, and Purulence  Irrigation volume: 3 L     Irrigation fluid type: Normal Saline   Debridement type: Excisional Debridement  Side: right  Body Location: Dorsal hand overlying the third metacarpophalangeal joint  Tools used for debridement: scalpel, scissors, and rongeur  Pre-debridement Wound size (cm):   Length: 0        width: 0     depth: 0  Post-debridement Wound size (cm):   Length: 3 cm that was closed        width: 0     depth: 0  Debridement depth beyond dead/damaged tissue down to healthy viable tissue: yes  Tissue layer involved: skin, subcutaneous tissue, muscle / fascia  Nature of tissue removed: Slough, Devitalized Tissue, Non-viable tissue, and Purulence  Irrigation  volume: 3 L     Irrigation fluid type: Normal Saline

## 2020-09-22 NOTE — H&P (Signed)
History and Physical    Ebony Scott WGN:[id number] DOB: February 09, 1951 DOA: 09/22/2020  PCP: [name], MD  Patient coming from: home  Chief Complaint: right hand pain  HPI: Ebony Scott is a 70 y.o. female with medical history significant of ashtma, combined systolic/diastolic HF, tracheobronchomalacia. Presenting with right hand swelling and pain. She has had a right hand ganglion cyst for the last 3 months. She has been following with ortho. Per her report, she had had multiple I&Ds during this time; with her last being about 3 weeks ago. In the last 3 weeks, she had noticed increased swelling and erythema of that hand and arm. She saw her ortho team today for other matters and asked about her arm. They recommended that she come to the ED for eval. She denies any other aggravating or alleviating factors.   ED Course: Imaging did not show osteo. Ortho was consulted. Rec'd abx and anti-fungal (previous growth of yeast). Likely going to OR tonight. [facility] was called for admission.   Review of Systems:  Denies CP, palpitations, dyspnea, lightheadedness, dizziness, N/V/D. Reports fever, chronic cough. Review of systems is otherwise negative for all not mentioned in HPI.   PMHx Past Medical History:  Diagnosis Date   Acute deep vein thrombosis (DVT) of right lower extremity (HCC) 12/13/2017   Allergic rhinitis    Allergy    SEASONAL   Anemia    Angio-edema    Anxiety    pt denies   Arthritis    Phreesia 10/21/2019   Asthma    Asthma    Phreesia 10/21/2019   Breast cancer ([place]) 1998   in remission   Cataract    REMOVED   Clostridium difficile colitis 01/14/2018   Clostridium difficile diarrhea 78/46/9629   Complication of anesthesia    "had hard time waking up from it several times" (02/20/2012)   Depression    "some; don't take anything for it" (02/20/2012)   Diverticulosis    DVT (deep venous thrombosis) (HCC)    Exertional dyspnea    Fibromyalgia 11/2011   GERD  (gastroesophageal reflux disease)    Graves disease    Headache(784.0)    "related to allergies; more at different times during the year" (02/20/2012)   Hemorrhoids    Hiatal hernia    back and neck   Hx of adenomatous colonic polyps 04/12/2016   Hypercholesteremia    good cholesterol is high   Hypothyroidism    IBS (irritable bowel syndrome)    Moderate persistent asthma    -FeV1 72% 2011, -IgE 102 2011, CT sinus Neg 2011   Osteoporosis    on reclast yearly   Peripheral vascular disease ([place]) 2019   DVTs   Pneumonia 04/2011; ~ 11/2011   "double; single" (02/20/2012)   Recurrent upper respiratory infection (URI)    Seronegative rheumatoid arthritis ([place])    Dr. Lahoma Rocker   SIRS (systemic inflammatory response syndrome) ([place]) 02/10/2018   Tracheobronchomalacia     PSHx Past Surgical History:  Procedure Laterality Date   ABDOMINAL HYSTERECTOMY N/A    Phreesia 10/21/2019   ANTERIOR AND POSTERIOR REPAIR  1990's   APPENDECTOMY     BREAST LUMPECTOMY  1998   left   BREAST SURGERY N/A    Phreesia 10/21/2019   BRONCHIAL WASHINGS  04/05/2020   Procedure: BRONCHIAL WASHINGS;  Surgeon: Freddi Starr, MD;  Location: [facility];  Service: Pulmonary;;   CARPOMETACARPEL ([place]) FUSION OF THUMB WITH AUTOGRAFT FROM RADIUS  ~ 2009   "  both thumbs" (02/20/2012)   CATARACT EXTRACTION W/ INTRAOCULAR LENS  IMPLANT, BILATERAL  2012   CERVICAL DISCECTOMY  10/2001   C5-C6   CERVICAL FUSION  2003   C3-C4   CHOLECYSTECTOMY     COLONOSCOPY     DEBRIDEMENT TENNIS ELBOW  ?1970's   right   ESOPHAGOGASTRODUODENOSCOPY     EYE SURGERY N/A    Phreesia 10/21/2019   HYSTERECTOMY     KNEE ARTHROPLASTY  ?1990's   "?right; w/cartilage repair" (02/20/2012)   NASAL SEPTUM SURGERY  1980's   POSTERIOR CERVICAL FUSION/FORAMINOTOMY  2004   "failed initial fusion; rewired  anterior neck" (02/20/2012)   REVERSE SHOULDER ARTHROPLASTY Right 02/16/2020   Procedure: REVERSE SHOULDER ARTHROPLASTY;  Surgeon:  Teryl Lucy, MD;  Location: [facility];  Service: Orthopedics;  Laterality: Right;   SPINE SURGERY N/A    Phreesia 10/21/2019   TONSILLECTOMY  ~ 1953   VESICOVAGINAL FISTULA CLOSURE W/ TAH  1988   VIDEO BRONCHOSCOPY Bilateral 08/23/2016   Procedure: VIDEO BRONCHOSCOPY WITH FLUORO;  Surgeon: Roslynn Amble, MD;  Location: [facility];  Service: Cardiopulmonary;  Laterality: Bilateral;   VIDEO BRONCHOSCOPY N/A 04/05/2020   Procedure: VIDEO BRONCHOSCOPY WITHOUT FLUORO;  Surgeon: Martina Sinner, MD;  Location: [facility];  Service: Pulmonary;  Laterality: N/A;    SocHx  reports that she has never smoked. She has been exposed to tobacco smoke. She has never used smokeless tobacco. She reports previous alcohol use. She reports that she does not use drugs.  Allergies  Allergen Reactions   Dust Mite Extract Shortness Of Breath and Other (See Comments)    "sneezing" (02/20/2012)   Molds & Smuts Shortness Of Breath   Morphine And Related Hives and Itching   Other Shortness Of Breath and Other (See Comments)    Grass and weeds "sneezing; filled sinuses" (02/20/2012)   Penicillins Rash and Other (See Comments)    "welts" (02/20/2012) Has patient had a PCN reaction causing immediate rash, facial/tongue/throat swelling, SOB or lightheadedness with hypotension: Unknown Has patient had a PCN reaction causing severe rash involving mucus membranes or skin necrosis: No Has patient had a PCN reaction that required hospitalization: No Has patient had a PCN reaction occurring within the last 10 years: No If all of the above answers are "NO", then may proceed with Cephalosporin use. Tolerated Ancef 02/16/20.    Reclast [Zoledronic Acid] Other (See Comments)    Fever, Put in hospital, dr said it was a reaction from a reaction    Rofecoxib Swelling    Vioxx REACTION: feet swelling   Shellfish Allergy Anaphylaxis, Shortness Of Breath, Itching and Rash   Tetracycline Hcl Nausea And Vomiting   Xolair  [Omalizumab] Other (See Comments)    Caused Blood clot   Dilaudid [Hydromorphone Hcl] Itching   Hydrocodone-Acetaminophen Nausea And Vomiting   Levofloxacin Other (See Comments)    GI upset   Oxycodone Hcl Nausea And Vomiting   Paroxetine Nausea And Vomiting    Paxil    Baclofen Other (See Comments)    Altered mental status requiring 3 day hospital stay    Celecoxib Swelling    Feet swelling   Diltiazem Swelling   Lactose Intolerance (Gi) Other (See Comments)    Bloating and gas   Tree Extract Other (See Comments)    "tested and told I was allergic to it; never experienced a reaction to it" (02/20/2012)    FamHx Family History  Problem Relation Age of Onset   Allergies Mother  Heart disease Mother    Arthritis Mother    Lung cancer Mother    Diabetes Mother    Allergies Father    Heart disease Father    Arthritis Father    Stroke Father    Colon cancer Other        Maternal half aunt/Maternal half uncle   Colitis Daughter    Diabetes Maternal Grandfather     Prior to Admission medications   Medication Sig Start Date End Date Taking? Authorizing Provider  acetaminophen (TYLENOL) 650 MG CR tablet Take 1,300 mg by mouth every 8 (eight) hours as needed for pain.    [provider]  albuterol (PROVENTIL) (2.5 MG/3ML) 0.083% nebulizer solution USE 1 AMPULE IN NEBULIZER IN AM AT BEDTIME ALONG WITH FLUTTER VALVE Patient taking differently: Take 2.5 mg by nebulization See admin instructions. USE 1 AMPULE IN NEBULIZER IN AM AT BEDTIME ALONG WITH FLUTTER VALVE 08/08/20   Freddi Starr, MD  albuterol (VENTOLIN HFA) 108 (90 Base) MCG/ACT inhaler INHALE 2 PUFFS BY MOUTH INTO THE LUNGS EVERY 4 HOURS AS NEEDED FOR WHEEZING OR SHORTNESS OF BREATH 07/04/20   Freddi Starr, MD  allopurinol (ZYLOPRIM) 100 MG tablet Take 100 mg by mouth daily. 11/19/19   [provider]  Alpha-D-Galactosidase (BEANO PO) Take 2-3 capsules by mouth 3 (three) times daily with meals.     [provider]  Alpha-Lipoic Acid 600 MG CAPS Take 600 mg by mouth daily.     [provider]  ammonium lactate (AMLACTIN) 12 % cream Apply topically as needed for dry skin. Patient taking differently: Apply 1 g topically at bedtime. 07/30/19   Trula Slade, DPM  atorvastatin (LIPITOR) 10 MG tablet TAKE 1 TABLET BY MOUTH DAILY 03/16/20   [name], MD  azelastine (ASTELIN) 0.1 % nasal spray USE 2 SPRAYS IN EACH NOSTRIL DAILY AS DIRECTED Patient taking differently: Place 2 sprays into both nostrils daily as needed for allergies. 01/02/19   Kozlow, Donnamarie Poag, MD  benzonatate (TESSALON) 200 MG capsule TAKE 1 CAPSULE BY MOUTH TWICE DAILY AS NEEDED FOR COUGH 05/26/20   [name], MD  Blood Glucose Monitoring Suppl (BLOOD GLUCOSE SYSTEM PAK) KIT Please dispense per patient and insurance preference. Use as directed to monitor FSBS 4x . Dx: E11.65 09/08/20   [name], MD  Calcium Citrate (CITRACAL PO) Take 1 tablet by mouth in the morning and at bedtime.    [provider]  CALCIUM-MAGNESIUM PO Take 1 tablet by mouth 2 (two) times daily.    [provider]  cetirizine (ZYRTEC) 10 MG tablet Take 10 mg by mouth daily.    [provider]  cholecalciferol (VITAMIN D) 25 MCG (1000 UT) tablet Take 1,000 Units by mouth daily.     [provider]  Continuous Blood Gluc Receiver (FREESTYLE LIBRE 14 DAY READER) DEVI Apply 1 each topically daily as needed. USE AS DIRECTED TO MONITOR BLOOD GLUCOSE LEVELS. DX: E11.9. 09/16/20   [name], MD  Continuous Blood Gluc Sensor (FREESTYLE LIBRE 14 DAY SENSOR) MISC Apply 1 each topically every 14 (fourteen) days. USE AS DIRECTED TO MONITOR BLOOD GLUCOSE LEVELS. DX: E11.9. 09/16/20   [name], MD  COVID-19 mRNA vaccine, Pfizer, 30 MCG/0.3ML injection INJECT AS DIRECTED 12/16/19 12/15/20  Carlyle Basques, MD  cyproheptadine (PERIACTIN) 4 MG tablet TAKE 2 TABLETS BY MOUTH EVERY  EVENING Patient taking differently: Take 8 mg by mouth every evening. 02/02/20   Jenna Luo  T, MD  denosumab (PROLIA) 60 MG/ML SOLN injection Inject 60 mg into the skin every 6 (six) months. Administer in upper arm, thigh, or abdomen    [provider]  DEXILANT 60 MG capsule TAKE 1 CAPSULE BY MOUTH EACH MORNING Patient taking differently: Take 60 mg by mouth daily. 02/02/20   [name], MD  diclofenac Sodium (VOLTAREN) 1 % GEL Apply 2 g topically 4 (four) times daily as needed (joint pain). 05/26/20   [name], MD  EPINEPHrine (EPIPEN 2-PAK) 0.3 mg/0.3 mL IJ SOAJ injection Inject 0.3 mLs (0.3 mg total) into the muscle Once PRN. 01/23/19   [name], MD  famotidine (PEPCID) 20 MG tablet TAKE 1 TABLET BY MOUTH TWICE A DAY Patient taking differently: Take 20 mg by mouth 2 (two) times daily. 02/01/20   Kozlow, Donnamarie Poag, MD  fluticasone (FLONASE) 50 MCG/ACT nasal spray USE 2 SPRAYS IN [facility] NOSTRIL DAILY 09/02/20   Freddi Starr, MD  folic acid (FOLVITE) 1 MG tablet Take 1 mg by mouth daily.    [provider]  furosemide (LASIX) 40 MG tablet Take 1 tablet (40 mg total) by mouth daily. 80 mg in the AM and 40 mg in the PM 08/30/20   [name], MD  gabapentin (NEURONTIN) 600 MG tablet TAKE 1 TABLET BY MOUTH DAILY Patient taking differently: Take 600 mg by mouth at bedtime. 02/03/20   [name], MD  Glucose Blood (BLOOD GLUCOSE TEST STRIPS) STRP Please dispense per patient and insurance preference. Use as directed to monitor FSBS 4x . Dx: E11.65 09/08/20   [name], MD  guaiFENesin (MUCINEX) 600 MG 12 hr tablet Take 1 tablet (600 mg total) by mouth 2 (two) times daily as needed for cough or to loosen phlegm. 05/09/20   [name], MD  hydrocortisone 2.5 % cream Apply topically 2 (two) times daily as needed (hemorrhoids). 05/09/20   [name], MD  Lactase 9000 units TABS Take 18,000-27,000 Units by mouth 4 (four) times daily  as needed (dairy consumption).    [provider]  Lancets MISC Please dispense per patient and insurance preference. Use as directed to monitor FSBS 4x . Dx: E11.65 09/08/20   [name], MD  lidocaine (LIDODERM) 5 % Place 1 patch onto the skin daily as needed (pain). Remove & Discard patch within 12 hours or as directed by MD 01/23/18   Aline August, MD  magnesium oxide (MAG-OX) 400 MG tablet Take 400 mg by mouth daily.    [provider]  metolazone (ZAROXOLYN) 5 MG tablet Take 1 tablet (5 mg total) by mouth daily. Patient not taking: Reported on 09/06/2020 08/25/20   [name], MD  mirtazapine (REMERON SOL-TAB) 30 MG disintegrating tablet DISSOLVE 1 TABLET BY MOUTH EVERY NIGHT AT BEDTIME 05/26/20   [name], MD  MITIGARE 0.6 MG CAPS Take 0.6 mg by mouth every other day. 01/16/20   [provider]  montelukast (SINGULAIR) 10 MG tablet TAKE 1 TABLET BY MOUTH DAILY 09/05/20   Kozlow, Donnamarie Poag, MD  mupirocin ointment (BACTROBAN) 2 % APPLY TO BED SORES AS NEEDED 03/21/20 03/21/21  [name], MD  Nystatin (GERHARDT'S BUTT CREAM) CREA Apply 1 application topically daily as needed (hemorrhoids). NYSTATIN 100,000 UNIT/GM CR 100000 CRM- 60 g, ZINC OXIDE 20 % OINT 20 OINT- 60 g, HYDROCORTISONE 1 % CREAM- 60 g 03/24/20   [name], MD  nystatin (MYCOSTATIN) 100000 UNIT/ML suspension  SWISH AND SPIT 5 MLS TWICE DAILY Patient taking differently: Use as directed 5 mLs in the mouth or throat at bedtime. 03/16/20   Kozlow, Donnamarie Poag, MD  nystatin cream (MYCOSTATIN) Apply 1 application topically 2 (two) times daily. 02/15/20   [provider]  nystatin-triamcinolone ointment (MYCOLOG) Apply topically 2 (two) times daily. Patient taking differently: Apply 1 application topically 2 (two) times daily as needed (yeast infections). 07/07/19   [name], MD  ondansetron (ZOFRAN) 4 MG tablet Take 1 tablet (4 mg total) by mouth every 8 (eight) hours as  needed for nausea or vomiting. 02/19/20   Merlene Pulling K, PA-C  potassium chloride SA (KLOR-CON) 20 MEQ tablet Take 1 tablet (20 mEq total) by mouth daily. 08/30/20   [name], MD  predniSONE (DELTASONE) 5 MG tablet Take 15 mg by mouth daily with breakfast.    [provider]  Prenatal Vit-Fe Fumarate-FA (PRENATAL MULTIVITAMIN) TABS tablet Take 1 tablet by mouth daily.    [provider]  Probiotic Product (ALIGN PO) Take 1 capsule by mouth daily.    [provider]  Propylene Glycol (SYSTANE COMPLETE) 0.6 % SOLN Place 1 drop into both eyes 2 (two) times daily as needed (dry eyes).    [provider]  Respiratory Therapy Supplies (FLUTTER) DEVI Use as directed 10/22/14   Elsie Stain, MD  Salicylic Acid-Urea [facility] EX) Apply 1 application topically at bedtime.    [provider]  sodium chloride HYPERTONIC 3 % nebulizer solution Take by nebulization 2 (two) times daily. 02/02/20   Julian Hy, DO  Spacer/Aero-Holding Chambers (AEROCHAMBER PLUS) inhaler Use as instructed 07/29/18   Parrett, Fonnie Mu, NP  Tiotropium Bromide-Olodaterol (STIOLTO RESPIMAT) 2.5-2.5 MCG/ACT AERS Inhale 2 puffs into the lungs daily. 08/05/20   Freddi Starr, MD  XARELTO 20 MG TABS tablet TAKE 1 TABLET BY MOUTH DAILY WITH SUPPER Patient taking differently: Take 20 mg by mouth every evening. 02/02/20   [name], MD    Physical Exam: Vitals:   09/22/20 1211 09/22/20 1313 09/22/20 1339 09/22/20 1430  BP:   129/61 (!) 132/59  Pulse:   92 90  Resp:   19 (!) 32  Temp:  98.4 F (36.9 C)    TempSrc:  Oral    SpO2:   (!) 89% 96%  Weight: 63.5 kg     Height: $Remove'5\' 1"'[id number]$  (1.549 m)       General: 70 y.o. female resting in bed in NAD Eyes: PERRL, normal sclera ENMT: Nares patent w/o discharge, orophaynx clear, dentition normal, ears w/o discharge/lesions/ulcers Neck: Supple, trachea midline Cardiovascular: RRR, +S1, S2, no m/g/r, equal pulses  throughout Respiratory: upper airway transmission, course sound in right base, normal WOB on RA GI: BS+, NDNT, no masses noted, no organomegaly noted MSK: No c/c; right hand edema/erythema  Neuro: A&O x 3, no focal deficits Psyc: Appropriate interaction and affect, calm/cooperative  Labs on Admission: I have personally reviewed following labs and imaging studies  CBC: Recent Labs  Lab 09/22/20 1302  WBC 14.5*  NEUTROABS 10.6*  HGB 11.7*  HCT 37.3  MCV 105.7*  PLT 671   Basic Metabolic Panel: Recent Labs  Lab 09/22/20 1302  NA 138  K 3.9  CL 98  CO2 27  GLUCOSE 157*  BUN 29*  CREATININE 1.59*  CALCIUM 10.0   GFR: Estimated Creatinine Clearance: 27.3 mL/min (A) (by C-G formula based on SCr of 1.59 mg/dL (H)). Liver Function Tests: Recent Labs  Lab 09/22/20 1302  AST 53*  ALT 72*  ALKPHOS 82  BILITOT 1.1  PROT 6.5  ALBUMIN 3.3*   No results for input(s): LIPASE, AMYLASE in the last 168 hours. No results for input(s): AMMONIA in the last 168 hours. Coagulation Profile: Recent Labs  Lab 09/22/20 1302  INR 1.0   Cardiac Enzymes: No results for input(s): CKTOTAL, CKMB, CKMBINDEX, TROPONINI in the last 168 hours. BNP (last 3 results) No results for input(s): PROBNP in the last 8760 hours. HbA1C: No results for input(s): HGBA1C in the last 72 hours. CBG: No results for input(s): GLUCAP in the last 168 hours. Lipid Profile: No results for input(s): CHOL, HDL, LDLCALC, TRIG, CHOLHDL, LDLDIRECT in the last 72 hours. Thyroid Function Tests: No results for input(s): TSH, T4TOTAL, FREET4, T3FREE, THYROIDAB in the last 72 hours. Anemia Panel: No results for input(s): VITAMINB12, FOLATE, FERRITIN, TIBC, IRON, RETICCTPCT in the last 72 hours. Urine analysis:    Component Value Date/Time   COLORURINE YELLOW 02/23/2020 1300   APPEARANCEUR CLEAR 02/23/2020 1300   LABSPEC 1.014 02/23/2020 1300   PHURINE 6.0 02/23/2020 1300   GLUCOSEU NEGATIVE 02/23/2020 1300    HGBUR NEGATIVE 02/23/2020 1300   BILIRUBINUR NEGATIVE 02/23/2020 1300   KETONESUR NEGATIVE 02/23/2020 1300   PROTEINUR NEGATIVE 02/23/2020 1300   UROBILINOGEN 0.2 05/17/2014 1113   NITRITE NEGATIVE 02/23/2020 1300   LEUKOCYTESUR NEGATIVE 02/23/2020 1300    Radiological Exams on Admission: DG Chest Port 1 View  Result Date: 09/22/2020 CLINICAL DATA:  Questionable sepsis.  Evaluate for abnormality. EXAM: PORTABLE CHEST 1 VIEW COMPARISON:  Chest CT 08/23/20 FINDINGS: Electronic device is positioned over the right upper lobe which precludes evaluation of this area. Stable cardiac enlargement. No pleural effusion or edema. No airspace opacities identified. Remote left posterior rib fractures. Previous right shoulder arthroplasty. IMPRESSION: No acute cardiopulmonary abnormalities. Electronically Signed   By: Kerby Moors M.D.   On: 09/22/2020 14:23   DG Hand Complete Right  Result Date: 09/22/2020 CLINICAL DATA:  Questionable sepsis - evaluate for abnormality EXAM: RIGHT HAND - COMPLETE 3+ VIEW COMPARISON:  Radiograph 05/25/2020 FINDINGS: There is no acute fracture or dislocation. Prior resection of the trapezium. Mild radiocarpal and intercarpal joint degenerative change. There is volar subluxation of the middle finger MCP joint and possibly the index finger MCP joint. There is moderate-severe interphalangeal joint degenerative change, worst at the proximal phalangeal joints of the third through fifth digits. There is prominent dorsal soft tissue swelling. There are soft tissue calcifications along the thumb and heterotopic ossification along the volar soft tissues adjacent to the fourth metacarpal, unchanged from prior exam. There is no new osseous erosion or destruction. IMPRESSION: Prominent dorsal soft tissue swelling. Scattered osteoarthritis with prior trapeziectomy. Volar subluxation of the third MCP joint and possibly the index finger MCP joint which may be chronic. No radiographic evidence of  osteomyelitis. Electronically Signed   By: Maurine Simmering   On: 09/22/2020 14:29    EKG: Independently reviewed. Sinus, no st elevations  Assessment/Plan Right hand cellulitis     - admit to inpt, med-surg '   - continue ancef and fluconazole for now     - ortho to take to OR; appreciate assistance     - NPO for now  DM2     - A1c, SS1, glucose checks, DM diet after surgery  Tracheobronchomalacia  Asthma     - continue stiolto; albuterol  Chronic combined systolic/diastolic HF     - elevated BNP, but it's  improved from 08/25/20     - continue home regimen; she shows no evidence of exacerbation  HLD     - resume home statin  Elevated LFTs     - looks like she has a chronic mild elevation; she's at baseline, ok to continue her home statin  History of DVT     - on xarelto, but she hasn't taken it in 2 days     - TED for now; resume xarelto post procedure  DVT prophylaxis: TED Code Status: DNR confirmed w/ pt  Family Communication: w/ caregiver at bedside  Consults called: EDP spoke w/ ortho   Status is: Inpatient  Remains inpatient appropriate because:Inpatient level of care appropriate due to severity of illness  Dispo: The patient is from: Home              Anticipated d/c is to: Home              Patient currently is not medically stable to d/c.   Difficult to place patient No  Time spent coordinating admission: 70 minutes  [facility]  If 7PM-7AM, please contact night-coverage [email or web address]  09/22/2020, 3:13 PM

## 2020-09-22 NOTE — Consult Note (Signed)
Reason for Consult:Right hand infection Referring Physician: Godfrey Pick Time called: [id number] Time at bedside: [name] is an 70 y.o. female.  HPI: Ebony Scott comes to the ED with a 1 week hx/o worsening of her preexisting right 3rd MCP nodule. She's been having fevers, increased pain and swelling, spreading erythema, and some N/V last night. This has been an ongoing problem. She has had multiple aspirations in Dr. Luanna Cole office with a yeast sp resulting. She is RHD.  Past Medical History:  Diagnosis Date   Acute deep vein thrombosis (DVT) of right lower extremity (HCC) 12/13/2017   Allergic rhinitis    Allergy    SEASONAL   Anemia    Angio-edema    Anxiety    pt denies   Arthritis    Phreesia 10/21/2019   Asthma    Asthma    Phreesia 10/21/2019   Breast cancer ([place]) 1998   in remission   Cataract    REMOVED   Clostridium difficile colitis 01/14/2018   Clostridium difficile diarrhea [id number]   Complication of anesthesia    "had hard time waking up from it several times" (02/20/2012)   Depression    "some; don't take anything for it" (02/20/2012)   Diverticulosis    DVT (deep venous thrombosis) (HCC)    Exertional dyspnea    Fibromyalgia 11/2011   GERD (gastroesophageal reflux disease)    Graves disease    Headache(784.0)    "related to allergies; more at different times during the year" (02/20/2012)   Hemorrhoids    Hiatal hernia    back and neck   Hx of adenomatous colonic polyps 04/12/2016   Hypercholesteremia    good cholesterol is high   Hypothyroidism    IBS (irritable bowel syndrome)    Moderate persistent asthma    -FeV1 72% 2011, -IgE 102 2011, CT sinus Neg 2011   Osteoporosis    on reclast yearly   Peripheral vascular disease ([place]) 2019   DVTs   Pneumonia 04/2011; ~ 11/2011   "double; single" (02/20/2012)   Recurrent upper respiratory infection (URI)    Seronegative rheumatoid arthritis ([place])    Dr. Lahoma Rocker   SIRS (systemic inflammatory response  syndrome) ([place]) 02/10/2018   Tracheobronchomalacia     Past Surgical History:  Procedure Laterality Date   ABDOMINAL HYSTERECTOMY N/A    Phreesia 10/21/2019   ANTERIOR AND POSTERIOR REPAIR  1990's   APPENDECTOMY     BREAST LUMPECTOMY  1998   left   BREAST SURGERY N/A    Phreesia 10/21/2019   BRONCHIAL WASHINGS  04/05/2020   Procedure: BRONCHIAL WASHINGS;  Surgeon: Freddi Starr, MD;  Location: [facility];  Service: Pulmonary;;   CARPOMETACARPEL ([place]) FUSION OF THUMB WITH AUTOGRAFT FROM RADIUS  ~ 2009   "both thumbs" (02/20/2012)   CATARACT EXTRACTION W/ INTRAOCULAR LENS  IMPLANT, BILATERAL  2012   CERVICAL DISCECTOMY  10/2001   C5-C6   CERVICAL FUSION  2003   C3-C4   CHOLECYSTECTOMY     COLONOSCOPY     DEBRIDEMENT TENNIS ELBOW  ?1970's   right   ESOPHAGOGASTRODUODENOSCOPY     EYE SURGERY N/A    Phreesia 10/21/2019   HYSTERECTOMY     KNEE ARTHROPLASTY  ?1990's   "?right; w/cartilage repair" (02/20/2012)   NASAL SEPTUM SURGERY  1980's   POSTERIOR CERVICAL FUSION/FORAMINOTOMY  2004   "failed initial fusion; rewired  anterior neck" (02/20/2012)   REVERSE SHOULDER ARTHROPLASTY Right 02/16/2020   Procedure: REVERSE SHOULDER  ARTHROPLASTY;  Surgeon: Marchia Bond, MD;  Location: [facility];  Service: Orthopedics;  Laterality: Right;   SPINE SURGERY N/A    Phreesia 10/21/2019   TONSILLECTOMY  ~ 1953   VESICOVAGINAL FISTULA CLOSURE W/ TAH  1988   VIDEO BRONCHOSCOPY Bilateral 08/23/2016   Procedure: VIDEO BRONCHOSCOPY WITH FLUORO;  Surgeon: Javier Glazier, MD;  Location: [facility];  Service: Cardiopulmonary;  Laterality: Bilateral;   VIDEO BRONCHOSCOPY N/A 04/05/2020   Procedure: VIDEO BRONCHOSCOPY WITHOUT FLUORO;  Surgeon: Freddi Starr, MD;  Location: [facility];  Service: Pulmonary;  Laterality: N/A;    Family History  Problem Relation Age of Onset   Allergies Mother    Heart disease Mother    Arthritis Mother    Lung cancer Mother    Diabetes Mother     Allergies Father    Heart disease Father    Arthritis Father    Stroke Father    Colon cancer Other        Maternal half aunt/Maternal half uncle   Colitis Daughter    Diabetes Maternal Grandfather     Social History:  reports that she has never smoked. She has been exposed to tobacco smoke. She has never used smokeless tobacco. She reports previous alcohol use. She reports that she does not use drugs.  Allergies:  Allergies  Allergen Reactions   Dust Mite Extract Shortness Of Breath and Other (See Comments)    "sneezing" (02/20/2012)   Molds & Smuts Shortness Of Breath   Morphine And Related Hives and Itching   Other Shortness Of Breath and Other (See Comments)    Grass and weeds "sneezing; filled sinuses" (02/20/2012)   Penicillins Rash and Other (See Comments)    "welts" (02/20/2012) Has patient had a PCN reaction causing immediate rash, facial/tongue/throat swelling, SOB or lightheadedness with hypotension: Unknown Has patient had a PCN reaction causing severe rash involving mucus membranes or skin necrosis: No Has patient had a PCN reaction that required hospitalization: No Has patient had a PCN reaction occurring within the last 10 years: No If all of the above answers are "NO", then may proceed with Cephalosporin use. Tolerated Ancef 02/16/20.    Reclast [Zoledronic Acid] Other (See Comments)    Fever, Put in hospital, dr said it was a reaction from a reaction    Rofecoxib Swelling    Vioxx REACTION: feet swelling   Shellfish Allergy Anaphylaxis, Shortness Of Breath, Itching and Rash   Tetracycline Hcl Nausea And Vomiting   Xolair [Omalizumab] Other (See Comments)    Caused Blood clot   Dilaudid [Hydromorphone Hcl] Itching   Hydrocodone-Acetaminophen Nausea And Vomiting   Levofloxacin Other (See Comments)    GI upset   Oxycodone Hcl Nausea And Vomiting   Paroxetine Nausea And Vomiting    Paxil    Baclofen Other (See Comments)    Altered mental status requiring 3 day  hospital stay    Celecoxib Swelling    Feet swelling   Diltiazem Swelling   Lactose Intolerance (Gi) Other (See Comments)    Bloating and gas   Tree Extract Other (See Comments)    "tested and told I was allergic to it; never experienced a reaction to it" (02/20/2012)    Medications: I have reviewed the patient's current medications.  Results for orders placed or performed during the hospital encounter of 09/22/20 (from the past 48 hour(s))  Lactic acid, plasma     Status: None   Collection Time: 09/22/20  1:02 PM  Result  Value Ref Range   Lactic Acid, Venous 1.6 0.5 - 1.9 mmol/L    Comment: Performed at [facility], [place] [address], [place], [place] [address]  Comprehensive metabolic panel     Status: Abnormal   Collection Time: 09/22/20  1:02 PM  Result Value Ref Range   Sodium 138 135 - 145 mmol/L   Potassium 3.9 3.5 - 5.1 mmol/L   Chloride 98 98 - 111 mmol/L   CO2 27 22 - 32 mmol/L   Glucose, Bld 157 (H) 70 - 99 mg/dL    Comment: Glucose reference range applies only to samples taken after fasting for at least 8 hours.   BUN 29 (H) 8 - 23 mg/dL   Creatinine, Ser 1.59 (H) 0.44 - 1.00 mg/dL   Calcium 10.0 8.9 - 10.3 mg/dL   Total Protein 6.5 6.5 - 8.1 g/dL   Albumin 3.3 (L) 3.5 - 5.0 g/dL   AST 53 (H) 15 - 41 U/L   ALT 72 (H) 0 - 44 U/L   Alkaline Phosphatase 82 38 - 126 U/L   Total Bilirubin 1.1 0.3 - 1.2 mg/dL   GFR, Estimated 34 (L) >60 mL/min    Comment: (NOTE) Calculated using the CKD-EPI Creatinine Equation (2021)    Anion gap 13 5 - 15    Comment: Performed at [facility], [place] [address] [place], [place] [address]  CBC WITH DIFFERENTIAL     Status: Abnormal   Collection Time: 09/22/20  1:02 PM  Result Value Ref Range   WBC 14.5 (H) 4.0 - 10.5 K/uL   RBC 3.53 (L) 3.87 - 5.11 MIL/uL   Hemoglobin 11.7 (L) 12.0 - 15.0 g/dL   HCT 37.3 36.0 - 46.0 %   MCV 105.7 (H) 80.0 - 100.0 fL   MCH 33.1 26.0 - 34.0 pg   MCHC 31.4 30.0 -  36.0 g/dL   RDW 16.7 (H) 11.5 - 15.5 %   Platelets 225 150 - 400 K/uL   nRBC 0.1 0.0 - 0.2 %   Neutrophils Relative % 74 %   Neutro Abs 10.6 (H) 1.7 - 7.7 K/uL   Lymphocytes Relative 7 %   Lymphs Abs 1.1 0.7 - 4.0 K/uL   Monocytes Relative 15 %   Monocytes Absolute 2.2 (H) 0.1 - 1.0 K/uL   Eosinophils Relative 2 %   Eosinophils Absolute 0.4 0.0 - 0.5 K/uL   Basophils Relative 0 %   Basophils Absolute 0.1 0.0 - 0.1 K/uL   Immature Granulocytes 2 %   Abs Immature Granulocytes 0.29 (H) 0.00 - 0.07 K/uL    Comment: Performed at [facility], [place] [address] [place], [place] [address]  Protime-INR     Status: None   Collection Time: 09/22/20  1:02 PM  Result Value Ref Range   Prothrombin Time 13.1 11.4 - 15.2 seconds   INR 1.0 0.8 - 1.2    Comment: (NOTE) INR goal varies based on device and disease states. Performed at [facility], [place] [address] [place], [place] [address]   Resp Panel by RT-PCR (Flu A&B, Covid) Nasopharyngeal Swab     Status: None   Collection Time: 09/22/20  1:04 PM   Specimen: Nasopharyngeal Swab; Nasopharyngeal(NP) swabs in vial transport medium  Result Value Ref Range   SARS Coronavirus 2 by RT PCR NEGATIVE NEGATIVE    Comment: (NOTE) SARS-CoV-2 target nucleic acids are NOT DETECTED.  The SARS-CoV-2 RNA is generally detectable in upper respiratory specimens  during the acute phase of infection. The lowest concentration of SARS-CoV-2 viral copies this assay can detect is 138 copies/mL. A negative result does not preclude SARS-Cov-2 infection and should not be used as the sole basis for treatment or other patient management decisions. A negative result may occur with  improper specimen collection/handling, submission of specimen other than nasopharyngeal swab, presence of viral mutation(s) within the areas targeted by this assay, and inadequate number of viral copies(<138 copies/mL). A negative result must be combined  with clinical observations, patient history, and epidemiological information. The expected result is Negative.  Fact Sheet for Patients:  EntrepreneurPulse.com.au  Fact Sheet for Healthcare Providers:  IncredibleEmployment.be  This test is no t yet approved or cleared by the [place] FDA and  has been authorized for detection and/or diagnosis of SARS-CoV-2 by FDA under an Emergency Use Authorization (EUA). This EUA will remain  in effect (meaning this test can be used) for the duration of the COVID-19 declaration under Section 564(b)(1) of the Act, 21 U.S.C.section 360bbb-3(b)(1), unless the authorization is terminated  or revoked sooner.       Influenza A by PCR NEGATIVE NEGATIVE   Influenza B by PCR NEGATIVE NEGATIVE    Comment: (NOTE) The Xpert Xpress SARS-CoV-2/FLU/RSV plus assay is intended as an aid in the diagnosis of influenza from Nasopharyngeal swab specimens and should not be used as a sole basis for treatment. Nasal washings and aspirates are unacceptable for Xpert Xpress SARS-CoV-2/FLU/RSV testing.  Fact Sheet for Patients: EntrepreneurPulse.com.au  Fact Sheet for Healthcare Providers: IncredibleEmployment.be  This test is not yet approved or cleared by the [place] FDA and has been authorized for detection and/or diagnosis of SARS-CoV-2 by FDA under an Emergency Use Authorization (EUA). This EUA will remain in effect (meaning this test can be used) for the duration of the COVID-19 declaration under Section 564(b)(1) of the Act, 21 U.S.C. section 360bbb-3(b)(1), unless the authorization is terminated or revoked.  Performed at [facility], [place] [address] [place], [place] [address]    *Note: Due to a large number of results and/or encounters for the requested time period, some results have not been displayed. A complete set of results can be found in Results  Review.    DG Chest Port 1 View  Result Date: 09/22/2020 CLINICAL DATA:  Questionable sepsis.  Evaluate for abnormality. EXAM: PORTABLE CHEST 1 VIEW COMPARISON:  Chest CT 08/23/20 FINDINGS: Electronic device is positioned over the right upper lobe which precludes evaluation of this area. Stable cardiac enlargement. No pleural effusion or edema. No airspace opacities identified. Remote left posterior rib fractures. Previous right shoulder arthroplasty. IMPRESSION: No acute cardiopulmonary abnormalities. Electronically Signed   By: Kerby Moors M.D.   On: 09/22/2020 14:23   DG Hand Complete Right  Result Date: 09/22/2020 CLINICAL DATA:  Questionable sepsis - evaluate for abnormality EXAM: RIGHT HAND - COMPLETE 3+ VIEW COMPARISON:  Radiograph 05/25/2020 FINDINGS: There is no acute fracture or dislocation. Prior resection of the trapezium. Mild radiocarpal and intercarpal joint degenerative change. There is volar subluxation of the middle finger MCP joint and possibly the index finger MCP joint. There is moderate-severe interphalangeal joint degenerative change, worst at the proximal phalangeal joints of the third through fifth digits. There is prominent dorsal soft tissue swelling. There are soft tissue calcifications along the thumb and heterotopic ossification along the volar soft tissues adjacent to the fourth metacarpal, unchanged from prior exam. There is no new osseous erosion or destruction. IMPRESSION: Prominent dorsal  soft tissue swelling. Scattered osteoarthritis with prior trapeziectomy. Volar subluxation of the third MCP joint and possibly the index finger MCP joint which may be chronic. No radiographic evidence of osteomyelitis. Electronically Signed   By: Maurine Simmering   On: 09/22/2020 14:29    Review of Systems  Constitutional:  Positive for fever. Negative for chills and diaphoresis.  HENT:  Negative for ear discharge, ear pain, hearing loss and tinnitus.   Eyes:  Negative for photophobia and  pain.  Respiratory:  Negative for cough and shortness of breath.   Cardiovascular:  Negative for chest pain.  Gastrointestinal:  Positive for nausea and vomiting. Negative for abdominal pain.  Genitourinary:  Negative for dysuria, flank pain, frequency and urgency.  Musculoskeletal:  Positive for arthralgias (Right hand). Negative for back pain, myalgias and neck pain.  Neurological:  Negative for dizziness and headaches.  Hematological:  Does not bruise/bleed easily.  Psychiatric/Behavioral:  The patient is not nervous/anxious.   Blood pressure (!) 132/59, pulse 90, temperature 98.4 F (36.9 C), temperature source Oral, resp. rate (!) 32, height '5\' 1"'$  (1.549 m), weight 63.5 kg, SpO2 96 %. Physical Exam Constitutional:      General: She is not in acute distress.    Appearance: She is well-developed. She is not diaphoretic.  HENT:     Head: Normocephalic and atraumatic.  Eyes:     General: No scleral icterus.       Right eye: No discharge.        Left eye: No discharge.     Conjunctiva/sclera: Conjunctivae normal.  Cardiovascular:     Rate and Rhythm: Normal rate and regular rhythm.  Pulmonary:     Effort: Pulmonary effort is normal. No respiratory distress.  Musculoskeletal:     Cervical back: Normal range of motion.     Comments: Right shoulder, elbow, wrist, digits- no skin wounds, mod TTP hand, esp 3rd MCP joint, diffuse erythema/edema, no instability, no blocks to motion  Sens  Ax/R/M/U intact  Mot   Ax/ R/ PIN/ M/ AIN/ U intact  Rad 2+   Skin:    General: Skin is warm and dry.  Neurological:     Mental Status: She is alert.  Psychiatric:        Mood and Affect: Mood normal.        Behavior: Behavior normal.    Assessment/Plan: Right hand infection -- Plan formal operative I&D by Dr. Mardelle Matte this evening. Please keep NPO. Will probably need broad spectrum abx and an antifungal as well. Medicine to admit and manage, appreciate their help.    Lisette Abu,  PA-C Orthopedic Surgery [phone] 09/22/2020, 2:45 PM

## 2020-09-22 NOTE — Anesthesia Preprocedure Evaluation (Addendum)
Anesthesia Evaluation  Patient identified by MRN, date of birth, ID band Patient awake    Reviewed: Allergy & Precautions, NPO status , Patient's Chart, lab work & pertinent test results, Unable to perform ROS - Chart review only  Airway Mallampati: III  TM Distance: >3 FB Neck ROM: Limited  Mouth opening: Limited Mouth Opening  Dental no notable dental hx.    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 Hyperdynamic LV function; grade 1 diastolic dysfunction; moderate LVH; trace AI. 2. Left ventricular ejection fraction, by estimation, is >75%. The left ventricle has hyperdynamic function. The left ventricle has no regional wall motion abnormalities. There is moderate left ventricular hypertrophy. Left ventricular diastolic parameters are consistent with Grade I diastolic dysfunction (impaired relaxation). 3. Right ventricular systolic function is normal. The right ventricular size is normal. 4. The mitral valve is normal in structure. No evidence of mitral valve regurgitation. No evidence of mitral stenosis. 5. The aortic valve has an indeterminant number of cusps. Aortic valve regurgitation is trivial. No aortic stenosis is present.   Neuro/Psych  Headaches, PSYCHIATRIC DISORDERS Anxiety Depression    GI/Hepatic Neg liver ROS, hiatal hernia, GERD  Medicated and Controlled,IBS (irritable bowel syndrome)   Endo/Other  Hypothyroidism   Renal/GU Renal disease     Musculoskeletal  (+) Arthritis  (on '15mg'$  prednisone), Rheumatoid disorders,  Fibromyalgia -Gout   Abdominal   Peds  Hematology  (+) anemia , HLD   Anesthesia Other Findings  + cough  Reproductive/Obstetrics                            Anesthesia Physical  Anesthesia Plan  ASA: 4 and emergent  Anesthesia Plan: General   Post-op Pain Management:    Induction: Intravenous and Rapid sequence  PONV Risk Score and Plan: 3 and Ondansetron, Dexamethasone, Midazolam and Treatment may vary due to age or medical condition  Airway Management Planned: Oral ETT and Video Laryngoscope Planned  Additional Equipment:   Intra-op Plan:   Post-operative Plan: Extubation in OR  Informed Consent: I have reviewed the patients History and Physical, chart, labs and discussed the procedure including the risks, benefits and alternatives for the proposed anesthesia with the patient or authorized representative who has indicated his/her understanding and acceptance.   Patient has DNR.  Discussed DNR with patient and Suspend DNR.   Dental advisory given  Plan Discussed with: CRNA  Anesthesia Plan Comments: (Patient would like DNR suspended perioperatively. She would like a full resuscitation with all measures if a cardiopulmonary event were to occur. Norton Blizzard, MD  )     Anesthesia Quick Evaluation

## 2020-09-23 ENCOUNTER — Encounter (HOSPITAL_COMMUNITY): Payer: Self-pay | Admitting: Orthopedic Surgery

## 2020-09-23 DIAGNOSIS — M71121 Other infective bursitis, right elbow: Principal | ICD-10-CM

## 2020-09-23 DIAGNOSIS — E1169 Type 2 diabetes mellitus with other specified complication: Secondary | ICD-10-CM

## 2020-09-23 DIAGNOSIS — E119 Type 2 diabetes mellitus without complications: Secondary | ICD-10-CM

## 2020-09-23 DIAGNOSIS — N1832 Chronic kidney disease, stage 3b: Secondary | ICD-10-CM | POA: Diagnosis not present

## 2020-09-23 DIAGNOSIS — E785 Hyperlipidemia, unspecified: Secondary | ICD-10-CM

## 2020-09-23 DIAGNOSIS — I509 Heart failure, unspecified: Secondary | ICD-10-CM

## 2020-09-23 DIAGNOSIS — I10 Essential (primary) hypertension: Secondary | ICD-10-CM

## 2020-09-23 DIAGNOSIS — L02511 Cutaneous abscess of right hand: Secondary | ICD-10-CM | POA: Diagnosis not present

## 2020-09-23 DIAGNOSIS — M869 Osteomyelitis, unspecified: Secondary | ICD-10-CM

## 2020-09-23 DIAGNOSIS — M069 Rheumatoid arthritis, unspecified: Secondary | ICD-10-CM

## 2020-09-23 DIAGNOSIS — L03113 Cellulitis of right upper limb: Secondary | ICD-10-CM | POA: Diagnosis not present

## 2020-09-23 DIAGNOSIS — B3789 Other sites of candidiasis: Secondary | ICD-10-CM

## 2020-09-23 HISTORY — DX: Osteomyelitis, unspecified: M86.9

## 2020-09-23 LAB — CBC
HCT: 35.1 % — ABNORMAL LOW (ref 36.0–46.0)
HCT: 35.6 % — ABNORMAL LOW (ref 36.0–46.0)
Hemoglobin: 10.9 g/dL — ABNORMAL LOW (ref 12.0–15.0)
Hemoglobin: 10.9 g/dL — ABNORMAL LOW (ref 12.0–15.0)
MCH: 32.8 pg (ref 26.0–34.0)
MCH: 33.4 pg (ref 26.0–34.0)
MCHC: 30.6 g/dL (ref 30.0–36.0)
MCHC: 31.1 g/dL (ref 30.0–36.0)
MCV: 107.2 fL — ABNORMAL HIGH (ref 80.0–100.0)
MCV: 107.7 fL — ABNORMAL HIGH (ref 80.0–100.0)
Platelets: 189 10*3/uL (ref 150–400)
Platelets: 193 10*3/uL (ref 150–400)
RBC: 3.26 MIL/uL — ABNORMAL LOW (ref 3.87–5.11)
RBC: 3.32 MIL/uL — ABNORMAL LOW (ref 3.87–5.11)
RDW: 16.6 % — ABNORMAL HIGH (ref 11.5–15.5)
RDW: 16.7 % — ABNORMAL HIGH (ref 11.5–15.5)
WBC: 16.7 10*3/uL — ABNORMAL HIGH (ref 4.0–10.5)
WBC: 16.8 10*3/uL — ABNORMAL HIGH (ref 4.0–10.5)
nRBC: 0 % (ref 0.0–0.2)
nRBC: 0 % (ref 0.0–0.2)

## 2020-09-23 LAB — COMPREHENSIVE METABOLIC PANEL
ALT: 60 U/L — ABNORMAL HIGH (ref 0–44)
AST: 62 U/L — ABNORMAL HIGH (ref 15–41)
Albumin: 3 g/dL — ABNORMAL LOW (ref 3.5–5.0)
Alkaline Phosphatase: 75 U/L (ref 38–126)
Anion gap: 13 (ref 5–15)
BUN: 28 mg/dL — ABNORMAL HIGH (ref 8–23)
CO2: 25 mmol/L (ref 22–32)
Calcium: 9.3 mg/dL (ref 8.9–10.3)
Chloride: 98 mmol/L (ref 98–111)
Creatinine, Ser: 1.59 mg/dL — ABNORMAL HIGH (ref 0.44–1.00)
GFR, Estimated: 34 mL/min — ABNORMAL LOW (ref >60)
Glucose, Bld: 175 mg/dL — ABNORMAL HIGH (ref 70–99)
Potassium: 5.3 mmol/L — ABNORMAL HIGH (ref 3.5–5.1)
Sodium: 136 mmol/L (ref 135–145)
Total Bilirubin: 1.9 mg/dL — ABNORMAL HIGH (ref 0.3–1.2)
Total Protein: 6.2 g/dL — ABNORMAL LOW (ref 6.5–8.1)

## 2020-09-23 LAB — LIPID PANEL
Cholesterol: 318 mg/dL — ABNORMAL HIGH (ref 0–200)
HDL: 38 mg/dL — ABNORMAL LOW (ref >40)
LDL Cholesterol: UNDETERMINED mg/dL (ref 0–99)
Total CHOL/HDL Ratio: 8.4 RATIO
Triglycerides: 475 mg/dL — ABNORMAL HIGH (ref <150)
VLDL: UNDETERMINED mg/dL (ref 0–40)

## 2020-09-23 LAB — GLUCOSE, CAPILLARY
Glucose-Capillary: 171 mg/dL — ABNORMAL HIGH (ref 70–99)
Glucose-Capillary: 223 mg/dL — ABNORMAL HIGH (ref 70–99)
Glucose-Capillary: 240 mg/dL — ABNORMAL HIGH (ref 70–99)
Glucose-Capillary: 257 mg/dL — ABNORMAL HIGH (ref 70–99)
Glucose-Capillary: 324 mg/dL — ABNORMAL HIGH (ref 70–99)

## 2020-09-23 LAB — HEMOGLOBIN A1C
Hgb A1c MFr Bld: 8.1 % — ABNORMAL HIGH (ref 4.8–5.6)
Mean Plasma Glucose: 185.77 mg/dL

## 2020-09-23 LAB — CREATININE, SERUM
Creatinine, Ser: 1.6 mg/dL — ABNORMAL HIGH (ref 0.44–1.00)
GFR, Estimated: 34 mL/min — ABNORMAL LOW (ref >60)

## 2020-09-23 LAB — TSH: TSH: 0.983 u[IU]/mL (ref 0.350–4.500)

## 2020-09-23 LAB — LACTIC ACID, PLASMA: Lactic Acid, Venous: 1.3 mmol/L (ref 0.5–1.9)

## 2020-09-23 MED ORDER — METRONIDAZOLE 500 MG/100ML IV SOLN
500.0000 mg | Freq: Once | INTRAVENOUS | Status: AC
  Administered 2020-09-23: 500 mg via INTRAVENOUS
  Filled 2020-09-23: qty 100

## 2020-09-23 MED ORDER — SODIUM CHLORIDE 0.9 % IV SOLN: INTRAVENOUS | Status: DC

## 2020-09-23 MED ORDER — ACETAMINOPHEN 325 MG PO TABS
650.0000 mg | ORAL_TABLET | ORAL | Status: DC | PRN
  Administered 2020-09-23 – 2020-09-29 (×5): 650 mg via ORAL
  Filled 2020-09-23 (×5): qty 2

## 2020-09-23 MED ORDER — GABAPENTIN 300 MG PO CAPS
600.0000 mg | ORAL_CAPSULE | Freq: Every day | ORAL | Status: DC
  Administered 2020-09-23 – 2020-09-29 (×7): 600 mg via ORAL
  Filled 2020-09-23 (×7): qty 2

## 2020-09-23 MED ORDER — GUAIFENESIN ER 600 MG PO TB12: 600.0000 mg | ORAL_TABLET | Freq: Two times a day (BID) | ORAL | Status: DC | PRN

## 2020-09-23 MED ORDER — OXYCODONE HCL 5 MG PO TABS
5.0000 mg | ORAL_TABLET | ORAL | Status: DC | PRN
Start: 2020-09-23 — End: 2020-09-27
  Administered 2020-09-24 – 2020-09-27 (×4): 5 mg via ORAL
  Filled 2020-09-23 (×4): qty 1

## 2020-09-23 MED ORDER — CYPROHEPTADINE HCL 4 MG PO TABS
8.0000 mg | ORAL_TABLET | Freq: Every evening | ORAL | Status: DC
  Administered 2020-09-23 – 2020-09-29 (×7): 8 mg via ORAL
  Filled 2020-09-23 (×8): qty 2

## 2020-09-23 MED ORDER — ALLOPURINOL 100 MG PO TABS
100.0000 mg | ORAL_TABLET | Freq: Every day | ORAL | Status: DC
  Administered 2020-09-23 – 2020-09-30 (×8): 100 mg via ORAL
  Filled 2020-09-23 (×8): qty 1

## 2020-09-23 MED ORDER — VANCOMYCIN HCL IN DEXTROSE 1-5 GM/200ML-% IV SOLN: 1000.0000 mg | INTRAVENOUS | Status: DC

## 2020-09-23 MED ORDER — TETRAHYDROZOLINE HCL 0.05 % OP SOLN
1.0000 [drp] | Freq: Two times a day (BID) | OPHTHALMIC | Status: DC
  Administered 2020-09-23 – 2020-09-30 (×14): 1 [drp] via OPHTHALMIC
  Filled 2020-09-23 (×3): qty 15

## 2020-09-23 MED ORDER — SODIUM CHLORIDE 0.9 % IV SOLN
1.0000 g | Freq: Once | INTRAVENOUS | Status: AC
  Administered 2020-09-23: 1 g via INTRAVENOUS
  Filled 2020-09-23: qty 10

## 2020-09-23 MED ORDER — PANTOPRAZOLE SODIUM 40 MG PO TBEC
40.0000 mg | DELAYED_RELEASE_TABLET | Freq: Every evening | ORAL | Status: DC
  Administered 2020-09-23 – 2020-09-29 (×7): 40 mg via ORAL
  Filled 2020-09-23 (×7): qty 1

## 2020-09-23 MED ORDER — SODIUM CHLORIDE 0.9 % IV SOLN
6.0000 mg/kg | Freq: Every day | INTRAVENOUS | Status: DC
  Administered 2020-09-24 – 2020-09-25 (×2): 400 mg via INTRAVENOUS
  Filled 2020-09-23 (×2): qty 8

## 2020-09-23 MED ORDER — RIVAROXABAN 20 MG PO TABS
20.0000 mg | ORAL_TABLET | Freq: Every day | ORAL | Status: DC
  Administered 2020-09-23 – 2020-09-29 (×7): 20 mg via ORAL
  Filled 2020-09-23 (×7): qty 1

## 2020-09-23 MED ORDER — FAMOTIDINE 20 MG PO TABS
20.0000 mg | ORAL_TABLET | Freq: Every day | ORAL | Status: DC
  Administered 2020-09-24 – 2020-09-30 (×7): 20 mg via ORAL
  Filled 2020-09-23 (×8): qty 1

## 2020-09-23 MED ORDER — AZELASTINE HCL 0.1 % NA SOLN
2.0000 | Freq: Every evening | NASAL | Status: DC | PRN
  Administered 2020-09-23 – 2020-09-24 (×2): 2 via NASAL
  Filled 2020-09-23 (×2): qty 30

## 2020-09-23 MED ORDER — FOLIC ACID 1 MG PO TABS
1.0000 mg | ORAL_TABLET | Freq: Every day | ORAL | Status: DC
  Administered 2020-09-23 – 2020-09-30 (×8): 1 mg via ORAL
  Filled 2020-09-23 (×8): qty 1

## 2020-09-23 MED ORDER — ALIGN PO CAPS: 1.0000 | ORAL_CAPSULE | Freq: Every day | ORAL | Status: DC

## 2020-09-23 MED ORDER — ATORVASTATIN CALCIUM 40 MG PO TABS: 80.0000 mg | ORAL_TABLET | Freq: Every day | ORAL | Status: DC

## 2020-09-23 MED ORDER — FUROSEMIDE 10 MG/ML IJ SOLN
60.0000 mg | Freq: Every day | INTRAMUSCULAR | Status: DC
  Administered 2020-09-23 – 2020-09-25 (×3): 60 mg via INTRAVENOUS
  Filled 2020-09-23 (×3): qty 6

## 2020-09-23 MED ORDER — ATORVASTATIN CALCIUM 10 MG PO TABS
10.0000 mg | ORAL_TABLET | Freq: Every day | ORAL | Status: DC
  Administered 2020-09-23: 10 mg via ORAL
  Filled 2020-09-23: qty 1

## 2020-09-23 MED ORDER — MONTELUKAST SODIUM 10 MG PO TABS
10.0000 mg | ORAL_TABLET | Freq: Every day | ORAL | Status: DC
  Administered 2020-09-23 – 2020-09-30 (×8): 10 mg via ORAL
  Filled 2020-09-23 (×9): qty 1

## 2020-09-23 MED ORDER — MORPHINE SULFATE (PF) 2 MG/ML IV SOLN
2.0000 mg | INTRAVENOUS | Status: DC | PRN
  Administered 2020-09-23 – 2020-09-26 (×3): 2 mg via INTRAVENOUS
  Filled 2020-09-23 (×3): qty 1

## 2020-09-23 MED ORDER — RIVAROXABAN 20 MG PO TABS: 20.0000 mg | ORAL_TABLET | Freq: Every day | ORAL | Status: DC

## 2020-09-23 MED ORDER — LORATADINE 10 MG PO TABS
10.0000 mg | ORAL_TABLET | Freq: Every day | ORAL | Status: DC
  Administered 2020-09-23 – 2020-09-30 (×7): 10 mg via ORAL
  Filled 2020-09-23 (×8): qty 1

## 2020-09-23 MED ORDER — ALBUTEROL SULFATE HFA 108 (90 BASE) MCG/ACT IN AERS: 2.0000 | INHALATION_SPRAY | RESPIRATORY_TRACT | Status: DC | PRN

## 2020-09-23 MED ORDER — FLUTICASONE PROPIONATE 50 MCG/ACT NA SUSP
2.0000 | Freq: Every day | NASAL | Status: DC
  Administered 2020-09-23 – 2020-09-30 (×8): 2 via NASAL
  Filled 2020-09-23: qty 16

## 2020-09-23 MED ORDER — PREDNISONE 5 MG PO TABS
15.0000 mg | ORAL_TABLET | Freq: Every day | ORAL | Status: DC
  Administered 2020-09-24 – 2020-09-27 (×4): 15 mg via ORAL
  Filled 2020-09-23 (×5): qty 3

## 2020-09-23 MED ORDER — TIOTROPIUM BROMIDE MONOHYDRATE 18 MCG IN CAPS: 18.0000 ug | ORAL_CAPSULE | Freq: Every day | RESPIRATORY_TRACT | Status: DC

## 2020-09-23 MED ORDER — FLUCONAZOLE 100 MG PO TABS
200.0000 mg | ORAL_TABLET | Freq: Every day | ORAL | Status: DC
  Administered 2020-09-23 – 2020-09-30 (×8): 200 mg via ORAL
  Filled 2020-09-23 (×8): qty 2

## 2020-09-23 MED ORDER — MAGNESIUM OXIDE -MG SUPPLEMENT 400 (240 MG) MG PO TABS
400.0000 mg | ORAL_TABLET | Freq: Every day | ORAL | Status: DC
  Administered 2020-09-23 – 2020-09-30 (×8): 400 mg via ORAL
  Filled 2020-09-23 (×10): qty 1

## 2020-09-23 MED ORDER — MIRTAZAPINE 30 MG PO TABS
30.0000 mg | ORAL_TABLET | Freq: Every day | ORAL | Status: DC
  Administered 2020-09-23 – 2020-09-29 (×7): 30 mg via ORAL
  Filled 2020-09-23 (×7): qty 1

## 2020-09-23 NOTE — Assessment & Plan Note (Signed)
-   see hand abscess

## 2020-09-23 NOTE — Assessment & Plan Note (Signed)
-   Noted on laryngoscopy with ENT outpatient as well.  Recent evaluation on 09/09/2020 - Has been recommended to be on Pepcid every morning and Dexilant in the late afternoon

## 2020-09-23 NOTE — Assessment & Plan Note (Addendum)
-   recent echo on 09/16/20 shows EF now reduced compared to prior echo 07/09/20 - EF now 35-40%, WMA; Gr 2 DD - patient was attempted to be contacted by PCP within the last few days but unsuccessful; she was going to be started on Farxiga and ARB with a cardiology referral - Entresto started on 8/5 per cardiology, has been uptitrated on 8/8; Wilder Glade on hold for now due to fungal infection (also need to ensure renal fxn appropriate when restarting) - small bump in BUN/Creat on 8/8; I've held IV lasix for now and will defer to cardiology on further dosing  - tentative plan is left/right heart cath for ischemic evaluation (not considered a good candidate for other modalities) once infection resolved and patient more stable (outpatient anticipated)

## 2020-09-23 NOTE — Evaluation (Signed)
Occupational Therapy Evaluation Patient Details Name: Ebony Scott MRN: [id number] DOB: 1951-02-03 Today's Date: 09/23/2020    History of Present Illness Ebony Scott is a 70 y.o. female with medical history significant of ashtma, combined systolic/diastolic HF, tracheobronchomalacia. Presenting with right hand swelling and pain. She has had a right hand ganglion cyst for the last 3 months. Per her report, she had had multiple I&Ds during this time; with her last being about 3 weeks ago. In the last 3 weeks, she had noticed increased swelling and erythema of that hand and arm. Patient s/p Excisional debridement, right olecranon bursa with olecranon bursectomy and irrigation and debridement and excisional debridement, right dorsal rheumatoid nodule, overlying the long metacarpal phalangeal joint   Clinical Impression   Ebony Scott is a 70 year old woman with above pertinent medical history without functional use of RUE as well as generalized weakness, poor activity tolerance and impaired balance. Patient found on 3 L [place] and dyspneic at rest though her sats and HR WNL. Patient's RUE in long arm splint limiting ROM and use. Patient min assist to transfer to side of bed and then to recliner. Patient not able to use RUE on walker today but also not able to tolerate much activity due to fatigue and difficulty breathing. Once again, sats 96-98%. Patient will benefit from skilled OT services while in hospital to improve deficits and learn compensatory strategies as needed in order to improve independence. Expect patient to improve while in hospital with no OT needs at discharge.     Follow Up Recommendations  No OT follow up    Equipment Recommendations  None recommended by OT    Recommendations for Other Services       Precautions / Restrictions Precautions Precautions: Fall Required Braces or Orthoses: Splint/Cast Splint/Cast: long posterior splint RUE Splint/Cast - Date Prophylactic Dressing  Applied (if applicable): 123XX123 Restrictions Weight Bearing Restrictions: No      Mobility Bed Mobility Overal bed mobility: Needs Assistance Bed Mobility: Supine to Sit     Supine to sit: Min assist;HOB elevated          Transfers Overall transfer level: Needs assistance Equipment used: 1 person hand held assist Transfers: Sit to/from [facility] Sit to Stand: Min assist Stand pivot transfers: Min assist       General transfer comment: Patient using one hand on walker and therapist steadying on other side. Unable to use arm functionally on walker    Balance Overall balance assessment: Needs assistance Sitting-balance support: No upper extremity supported Sitting balance-Leahy Scale: Fair     Standing balance support: Single extremity supported Standing balance-Leahy Scale: Poor                             ADL either performed or assessed with clinical judgement   ADL Overall ADL's : Needs assistance/impaired Eating/Feeding: Set up;Sitting   Grooming: Set up;Sitting   Upper Body Bathing: Moderate assistance;Sitting   Lower Body Bathing: Maximal assistance;Sit to/from stand   Upper Body Dressing : Moderate assistance;Sitting   Lower Body Dressing: Maximal assistance;Sit to/from stand   Toilet Transfer: BSC;Stand-pivot;Minimal assistance   Toileting- Clothing Manipulation and Hygiene: Maximal assistance;Sit to/from stand               Vision   Vision Assessment?: No apparent visual deficits     Perception     Praxis      Pertinent Vitals/Pain Pain Assessment:  Faces Faces Pain Scale: Hurts little more Pain Location: legs Pain Descriptors / Indicators: Aching;Grimacing Pain Intervention(s): Limited activity within patient's tolerance     Hand Dominance Right   Extremity/Trunk Assessment Upper Extremity Assessment Upper Extremity Assessment: RUE deficits/detail;LUE deficits/detail RUE Deficits / Details: able  to wiggle fingers, functional shoulder ROM RUE: Unable to fully assess due to immobilization LUE Deficits / Details: WFL ROM and strength LUE Sensation: WNL LUE Coordination: WNL   Lower Extremity Assessment Lower Extremity Assessment: Overall WFL for tasks assessed   Cervical / Trunk Assessment Cervical / Trunk Assessment: Kyphotic   Communication Communication Communication: No difficulties   Cognition Arousal/Alertness: Awake/alert Behavior During Therapy: WFL for tasks assessed/performed Overall Cognitive Status: Within Functional Limits for tasks assessed                                     General Comments       Exercises     Shoulder Instructions      Home Living Family/patient expects to be discharged to:: Private residence Living Arrangements: Children (daughter) Available Help at Discharge: Family;Available PRN/intermittently Type of Home: House Home Access: Stairs to enter [employer] of Steps: family does not let her use inside steps- stays on main floor once inside Entrance Stairs-Rails: Left Home Layout: Two level;Other (Comment);Able to live on main level with bedroom/bathroom     Bathroom Shower/Tub: Occupational psychologist: Standard Bathroom Accessibility: Yes How Accessible: Accessible via walker Home Equipment: Toilet riser;Shower seat;Cane - single point   Additional Comments: Daughter is an Therapist, sports that works night shift.      Prior Functioning/Environment Level of Independence: Independent with assistive device(s)                 OT Problem List: Decreased strength;Decreased range of motion;Decreased activity tolerance;Impaired balance (sitting and/or standing);Impaired UE functional use;Pain;Cardiopulmonary status limiting activity      OT Treatment/Interventions: Self-care/ADL training;Therapeutic exercise;DME and/or AE instruction;Balance training;Therapeutic activities    OT Goals(Current goals can  be found in the care plan section) Acute Rehab OT Goals Patient Stated Goal: did not state OT Goal Formulation: With patient Time For Goal Achievement: 10/07/20 Potential to Achieve Goals: Good  OT Frequency: Min 2X/week   Barriers to D/C:            Co-evaluation              AM-PAC OT "6 Clicks" Daily Activity     Outcome Measure Help from another person eating meals?: A Little Help from another person taking care of personal grooming?: A Little Help from another person toileting, which includes using toliet, bedpan, or urinal?: A Lot Help from another person bathing (including washing, rinsing, drying)?: A Lot Help from another person to put on and taking off regular upper body clothing?: A Lot Help from another person to put on and taking off regular lower body clothing?: A Lot 6 Click Score: 14   End of Session Equipment Utilized During Treatment: Oxygen Nurse Communication:  (okay to see per rn)  Activity Tolerance: Patient limited by fatigue Patient left: in chair;with call bell/phone within reach  OT Visit Diagnosis: Unsteadiness on feet (R26.81);Pain                Time: 0850-0909 OT Time Calculation (min): 19 min Charges:  OT General Charges $OT Visit: 1 Visit OT Evaluation $OT Eval Moderate Complexity: 1 Mod  Derl Barrow, OTR/L [place]  Office [phone] Pager: [place] 09/23/2020, 9:50 AM

## 2020-09-23 NOTE — Assessment & Plan Note (Addendum)
-   seen by ENT and treated with diflucan on 04/2020 and 05/2020 for laryngeal candidiasis -Recent evaluation by ENT on 09/09/2020.  No evidence of candidiasis or fungal infection on laryngoscopy; she was recommended to continue Pepcid every morning and Dexilant before 5 PM - Etiology is considered presumed to ongoing immunosuppression, inhalers, and possibly her multiple recurrent infections

## 2020-09-23 NOTE — Assessment & Plan Note (Addendum)
-   no recent lipid panel; TC 318, TG 475, HDL 38, unable to calc LDL - was on lipitor 10 mg daily at home; no noted intolerance on very long allergy profile - agree with cardiology increase of lipitor to 80 mg daily; this will have to wait until fluconazole course is complete due to interaction and will resume upon completion of fluconazole (there was also a dapto interaction as well)

## 2020-09-23 NOTE — Assessment & Plan Note (Addendum)
-   s/p excisional debridement of dorsal rheumatoid nodule and I&D of 3rd MCP - follow up pending cultures: rare yeast (speciated to C. Albicans) growing in hand and synovial cx; sensitivities being investigated - dapto discontinued per ID given yeast only - continue fluconazole; ID continuing to follow - dressing changes as per ortho; changed today, 8/9 (healing well, some serous drainage from elbow noted); will need final dressing changes rec's prior to d/c

## 2020-09-23 NOTE — Assessment & Plan Note (Signed)
-   followed by pulm - continue stiolto

## 2020-09-23 NOTE — Progress Notes (Addendum)
Progress Note    Ebony Scott   TKW:[id number]  DOB: [birth year]  DOA: 09/22/2020     1  PCP: [name], MD  CC: hand pain  Hospital Course: Ebony Scott is a 70 yo female with PMH DVT, anxiety/depression, RA, breast cancer, cdiff, diverticulosis, fibromyalgia, GERD, colonic polyps, HLD, DMII, CKD3b, hypothyroidism, IBS, moderate persistent asthma, osteoporosis, PVD, tracheobronchomalacia who presented with right hand pain and swelling.  She has been managed outpatient recently by ortho due to this; she has undergone previous aspirations and been seen by ID in past.  She was admitted for ortho evaluation for further debridement of her right hand and right elbow, which was performed on 09/22/20. ID will again follow inpatient for assistance with her complex recurrent infections.   Interval History:  No events overnight.  Seen in her room this morning.  Right hand covered and dressing from fingers to elbow.  Pain is controlled.  Reviewed plan being ID consult today, continuing current treatment, and following up cultures.  ROS: Constitutional: negative for chills and fevers, Respiratory: negative for cough and sputum, Cardiovascular: negative for chest pain, and Gastrointestinal: negative for abdominal pain  Assessment & Plan: * Septic olecranon bursitis of right elbow - has been followed by ID in the past; last seen in June 2022; has been on several courses of fluconazole for various albicans infections (see below as well) - underwent excisional debridement on 8/4 with ortho - follow up cultures - ID consulted; final plan per ID for abx  Abscess of dorsum of right hand - s/p excisional debridement of dorsal rheumatoid nodule and I&D of 3rd MCP - follow up pending cultures - follow up ID abx plan - dressing changes as per ortho as well; tentatively planned for 8/6  Cellulitis of right hand - see hand abscess   DMII (diabetes mellitus, type 2) ([place]) - A1c 8.4% on 09/22/20 -  recently to be started on Farxiga outpatient to aid in CHF treatment as well - continue SSI and CBG monitoring   Osteomyelitis of second toe of right foot ([place]) - s/p right 2nd digit internal amputation on 6/3 - following with podiatry; recently seen 09/09/20; toe healing as expected - WBAT in surgical shoe - continue betadine wet to dry dressing changes   Laryngeal candidiasis - seen by ENT and treated with diflucan on 04/2020 and 05/2020 for laryngeal candidiasis -Recent evaluation by ENT on 09/09/2020.  No evidence of candidiasis or fungal infection on laryngoscopy; she was recommended to continue Pepcid every morning and Dexilant before 5 PM - Etiology is considered presumed to ongoing immunosuppression, inhalers, and possibly her multiple recurrent infections  Tracheobronchomalacia - followed by pulm - continue stiolto   Rheumatoid arthritis ([place]) - treated with rituxan and prednisone 15 mg daily   Hypothyroidism - ?hx hyperthryoidism, likely treated - no synthroid on med rec - check TSH  Pulmonary nodules - followed closely by pulmonology outpatient - s/p BAL on 04/05/20; grew C. Albicans at that time - CT on 4/6 showed improved/decrease in size of nodules compared to 2/4 scan  Chronic kidney disease, stage 3b ([place]) - patient has history of CKD3b. Baseline creat ~ 1.2 - 1.4, eGFR 32-41 - creat 1.59 on admission; not far off baseline - continue trending BMP daily    Chronic combined systolic and diastolic CHF (congestive heart failure) ([place]) - recent echo on 09/16/20 shows EF now reduced compared to prior echo 07/09/20 - EF now 35-40%, WMA; Gr 2 DD - patient  was attempted to be contacted by PCP within the last few days but unsuccessful; she was going to be started on Farxiga and ARB with a cardiology referral - given complexity of patient clinically, will go ahead and have inpatient evaluation   GERD - Noted on laryngoscopy with ENT outpatient as well.  Recent evaluation on  09/09/2020 - Has been recommended to be on Pepcid every morning and Dexilant in the late afternoon  Hyperlipidemia - no recent lipid panel; will order updated now given HFrEF - continue lipitor    Old records reviewed in assessment of this patient  Antimicrobials: Vanc x 1 Ancef x 1 CTX x 1 Fluconazole 09/22/20 > current   DVT prophylaxis: SCD's Start: 09/22/20 2312 rivaroxaban (XARELTO) tablet 20 mg   Code Status:   Code Status: DNR Family Communication:   Disposition Plan: Status is: Inpatient  Remains inpatient appropriate because:IV treatments appropriate due to intensity of illness or inability to take PO and Inpatient level of care appropriate due to severity of illness  Dispo: The patient is from: Home              Anticipated d/c is to: Home              Patient currently is not medically stable to d/c.   Difficult to place patient No      Risk of unplanned readmission score: Unplanned Admission- Pilot do not use: 22.32   Objective: Blood pressure (!) 154/81, pulse 95, temperature 97.7 F (36.5 C), temperature source Oral, resp. rate 20, height _0  (1.549 m), weight 63.5 kg, SpO2 97 %.  Examination: General appearance: alert, cooperative, and no distress Head: Normocephalic, without obvious abnormality, atraumatic Eyes:  EOMI Lungs: clear to auscultation bilaterally Heart: regular rate and rhythm and S1, S2 normal Abdomen:  soft, obese, NT, ND, BS present Extremities:  RUE wrapped in dressing from fingers to above elbow; no obvious swelling in RUE. Right foot wrapped in dressing with betadine noted on dressings and no obvious swelling or pain in RLE Skin: mobility and turgor normal Neurologic: no paresthesias; no obvious focal deficits  Consultants:  Orthopedic surgery ID Cardiology   Procedures:  Excisional debridement RUE, 09/22/20  Data Reviewed: I have personally reviewed following labs and imaging studies Results for orders placed or performed  during the hospital encounter of 09/22/20 (from the past 24 hour(s))  Lactic acid, plasma     Status: None   Collection Time: 09/22/20  1:02 PM  Result Value Ref Range   Lactic Acid, Venous 1.6 0.5 - 1.9 mmol/L  Comprehensive metabolic panel     Status: Abnormal   Collection Time: 09/22/20  1:02 PM  Result Value Ref Range   Sodium 138 135 - 145 mmol/L   Potassium 3.9 3.5 - 5.1 mmol/L   Chloride 98 98 - 111 mmol/L   CO2 27 22 - 32 mmol/L   Glucose, Bld 157 (H) 70 - 99 mg/dL   BUN 29 (H) 8 - 23 mg/dL   Creatinine, Ser 1.59 (H) 0.44 - 1.00 mg/dL   Calcium 10.0 8.9 - 10.3 mg/dL   Total Protein 6.5 6.5 - 8.1 g/dL   Albumin 3.3 (L) 3.5 - 5.0 g/dL   AST 53 (H) 15 - 41 U/L   ALT 72 (H) 0 - 44 U/L   Alkaline Phosphatase 82 38 - 126 U/L   Total Bilirubin 1.1 0.3 - 1.2 mg/dL   GFR, Estimated 34 (L) >60 mL/min   Anion gap 13  5 - 15  CBC WITH DIFFERENTIAL     Status: Abnormal   Collection Time: 09/22/20  1:02 PM  Result Value Ref Range   WBC 14.5 (H) 4.0 - 10.5 K/uL   RBC 3.53 (L) 3.87 - 5.11 MIL/uL   Hemoglobin 11.7 (L) 12.0 - 15.0 g/dL   HCT 37.3 36.0 - 46.0 %   MCV 105.7 (H) 80.0 - 100.0 fL   MCH 33.1 26.0 - 34.0 pg   MCHC 31.4 30.0 - 36.0 g/dL   RDW 16.7 (H) 11.5 - 15.5 %   Platelets 225 150 - 400 K/uL   nRBC 0.1 0.0 - 0.2 %   Neutrophils Relative % 74 %   Neutro Abs 10.6 (H) 1.7 - 7.7 K/uL   Lymphocytes Relative 7 %   Lymphs Abs 1.1 0.7 - 4.0 K/uL   Monocytes Relative 15 %   Monocytes Absolute 2.2 (H) 0.1 - 1.0 K/uL   Eosinophils Relative 2 %   Eosinophils Absolute 0.4 0.0 - 0.5 K/uL   Basophils Relative 0 %   Basophils Absolute 0.1 0.0 - 0.1 K/uL   Immature Granulocytes 2 %   Abs Immature Granulocytes 0.29 (H) 0.00 - 0.07 K/uL  Protime-INR     Status: None   Collection Time: 09/22/20  1:02 PM  Result Value Ref Range   Prothrombin Time 13.1 11.4 - 15.2 seconds   INR 1.0 0.8 - 1.2  Blood Culture (routine x 2)     Status: None (Preliminary result)   Collection Time:  09/22/20  1:03 PM   Specimen: BLOOD  Result Value Ref Range   Specimen Description      BLOOD RIGHT ANTECUBITAL Performed at [facility], [place] [address] [place], [place] [address]    Special Requests      BOTTLES DRAWN AEROBIC AND ANAEROBIC Blood Culture adequate volume Performed at [facility] [address], [place], [place] [address]    Culture      NO GROWTH < 12 HOURS Performed at [facility] [address] [place], [place] [address]    Report Status PENDING   Resp Panel by RT-PCR (Flu A&B, Covid) Nasopharyngeal Swab     Status: None   Collection Time: 09/22/20  1:04 PM   Specimen: Nasopharyngeal Swab; Nasopharyngeal(NP) swabs in vial transport medium  Result Value Ref Range   SARS Coronavirus 2 by RT PCR NEGATIVE NEGATIVE   Influenza A by PCR NEGATIVE NEGATIVE   Influenza B by PCR NEGATIVE NEGATIVE  Blood Culture (routine x 2)     Status: None (Preliminary result)   Collection Time: 09/22/20  1:08 PM   Specimen: BLOOD  Result Value Ref Range   Specimen Description      BLOOD LEFT ANTECUBITAL Performed at [facility], [place] [address] [place], [place] [address]    Special Requests      BOTTLES DRAWN AEROBIC AND ANAEROBIC Blood Culture adequate volume Performed at [facility] [address], [place], [place] [address]    Culture      NO GROWTH < 12 HOURS Performed at [facility] [address], [place], [place] [address]    Report Status PENDING   Brain natriuretic peptide     Status: Abnormal   Collection Time: 09/22/20  2:26 PM  Result Value Ref Range   B Natriuretic Peptide 424.3 (H) 0.0 - 100.0 pg/mL  CBG monitoring, ED     Status: Abnormal  Collection Time: 09/22/20  5:03 PM  Result Value Ref Range   Glucose-Capillary 145 (H) 70 - 99 mg/dL  Aerobic/Anaerobic Culture w Gram Stain (surgical/deep wound)     Status: None (Preliminary result)   Collection Time:  09/22/20 10:05 PM   Specimen: Synovial, Right Elbow; Tissue  Result Value Ref Range   Specimen Description      SYNOVIAL Performed at [facility], [place] [address], [place], [place] [address]    Special Requests      NONE Performed at [facility], [place] [address] [place], [place] [address]    Gram Stain      RARE WBC PRESENT,BOTH PMN AND MONONUCLEAR NO ORGANISMS SEEN Performed at [facility], [place] [address], [place], [place] [address]    Culture PENDING    Report Status PENDING   Aerobic/Anaerobic Culture w Gram Stain (surgical/deep wound)     Status: None (Preliminary result)   Collection Time: 09/22/20 10:05 PM   Specimen: Hand, Right; Body Fluid  Result Value Ref Range   Specimen Description      HAND Performed at [facility] [address], [place], [place] [address]    Special Requests      NONE Performed at [facility], [place] [address], [place], [place] [address]    Gram Stain      RARE WBC PRESENT, PREDOMINANTLY PMN NO ORGANISMS SEEN Performed at [facility], [place] [address], [place], [place] [address]    Culture PENDING    Report Status PENDING   Lactic acid, plasma     Status: None   Collection Time: 09/22/20 11:52 PM  Result Value Ref Range   Lactic Acid, Venous 1.3 0.5 - 1.9 mmol/L  Hemoglobin A1c     Status: Abnormal   Collection Time: 09/22/20 11:52 PM  Result Value Ref Range   Hgb A1c MFr Bld 8.1 (H) 4.8 - 5.6 %   Mean Plasma Glucose 185.77 mg/dL  CBC     Status: Abnormal   Collection Time: 09/22/20 11:52 PM  Result Value Ref Range   WBC 16.7 (H) 4.0 - 10.5 K/uL   RBC 3.26 (L) 3.87 - 5.11 MIL/uL   Hemoglobin 10.9 (L) 12.0 - 15.0 g/dL   HCT 35.1 (L) 36.0 - 46.0 %   MCV 107.7 (H) 80.0 - 100.0 fL   MCH 33.4 26.0 - 34.0 pg   MCHC 31.1 30.0 - 36.0 g/dL   RDW 16.6 (H) 11.5 - 15.5 %   Platelets 193 150 - 400 K/uL   nRBC 0.0 0.0 - 0.2 %  Creatinine, serum      Status: Abnormal   Collection Time: 09/22/20 11:52 PM  Result Value Ref Range   Creatinine, Ser 1.60 (H) 0.44 - 1.00 mg/dL   GFR, Estimated 34 (L) >60 mL/min  Glucose, capillary     Status: Abnormal   Collection Time: 09/23/20 12:05 AM  Result Value Ref Range   Glucose-Capillary 171 (H) 70 - 99 mg/dL  Comprehensive metabolic panel     Status: Abnormal   Collection Time: 09/23/20 12:10 AM  Result Value Ref Range   Sodium 136 135 - 145 mmol/L   Potassium 5.3 (H) 3.5 - 5.1 mmol/L   Chloride 98 98 - 111 mmol/L   CO2 25 22 - 32 mmol/L   Glucose, Bld 175 (H) 70 - 99 mg/dL   BUN 28 (H) 8 - 23 mg/dL   Creatinine, Ser 1.59 (H)  0.44 - 1.00 mg/dL   Calcium 9.3 8.9 - 10.3 mg/dL   Total Protein 6.2 (L) 6.5 - 8.1 g/dL   Albumin 3.0 (L) 3.5 - 5.0 g/dL   AST 62 (H) 15 - 41 U/L   ALT 60 (H) 0 - 44 U/L   Alkaline Phosphatase 75 38 - 126 U/L   Total Bilirubin 1.9 (H) 0.3 - 1.2 mg/dL   GFR, Estimated 34 (L) >60 mL/min   Anion gap 13 5 - 15  CBC     Status: Abnormal   Collection Time: 09/23/20 12:10 AM  Result Value Ref Range   WBC 16.8 (H) 4.0 - 10.5 K/uL   RBC 3.32 (L) 3.87 - 5.11 MIL/uL   Hemoglobin 10.9 (L) 12.0 - 15.0 g/dL   HCT 35.6 (L) 36.0 - 46.0 %   MCV 107.2 (H) 80.0 - 100.0 fL   MCH 32.8 26.0 - 34.0 pg   MCHC 30.6 30.0 - 36.0 g/dL   RDW 16.7 (H) 11.5 - 15.5 %   Platelets 189 150 - 400 K/uL   nRBC 0.0 0.0 - 0.2 %  Glucose, capillary     Status: Abnormal   Collection Time: 09/23/20  7:22 AM  Result Value Ref Range   Glucose-Capillary 223 (H) 70 - 99 mg/dL   Comment 1 Notify RN   Glucose, capillary     Status: Abnormal   Collection Time: 09/23/20 11:26 AM  Result Value Ref Range   Glucose-Capillary 240 (H) 70 - 99 mg/dL   Comment 1 Notify RN    *Note: Due to a large number of results and/or encounters for the requested time period, some results have not been displayed. A complete set of results can be found in Results Review.    Recent Results (from the past 240 hour(s))   Blood Culture (routine x 2)     Status: None (Preliminary result)   Collection Time: 09/22/20  1:03 PM   Specimen: BLOOD  Result Value Ref Range Status   Specimen Description   Final    BLOOD RIGHT ANTECUBITAL Performed at [facility] [address], [place], [place] [address]    Special Requests   Final    BOTTLES DRAWN AEROBIC AND ANAEROBIC Blood Culture adequate volume Performed at [facility] [address] [place], [place] [address]    Culture   Final    NO GROWTH < 12 HOURS Performed at [facility] [address], [place], [place] [address]    Report Status PENDING  Incomplete  Resp Panel by RT-PCR (Flu A&B, Covid) Nasopharyngeal Swab     Status: None   Collection Time: 09/22/20  1:04 PM   Specimen: Nasopharyngeal Swab; Nasopharyngeal(NP) swabs in vial transport medium  Result Value Ref Range Status   SARS Coronavirus 2 by RT PCR NEGATIVE NEGATIVE Final    Comment: (NOTE) SARS-CoV-2 target nucleic acids are NOT DETECTED.  The SARS-CoV-2 RNA is generally detectable in upper respiratory specimens during the acute phase of infection. The lowest concentration of SARS-CoV-2 viral copies this assay can detect is 138 copies/mL. A negative result does not preclude SARS-Cov-2 infection and should not be used as the sole basis for treatment or other patient management decisions. A negative result may occur with  improper specimen collection/handling, submission of specimen other than nasopharyngeal swab, presence of viral mutation(s) within the areas targeted by this assay, and inadequate number of viral copies(<138 copies/mL). A negative result must be combined with clinical observations,  patient history, and epidemiological information. The expected result is Negative.  Fact Sheet for Patients:  EntrepreneurPulse.com.au  Fact Sheet for Healthcare Providers:  IncredibleEmployment.be  This  test is no t yet approved or cleared by the [place] FDA and  has been authorized for detection and/or diagnosis of SARS-CoV-2 by FDA under an Emergency Use Authorization (EUA). This EUA will remain  in effect (meaning this test can be used) for the duration of the COVID-19 declaration under Section 564(b)(1) of the Act, 21 U.S.C.section 360bbb-3(b)(1), unless the authorization is terminated  or revoked sooner.       Influenza A by PCR NEGATIVE NEGATIVE Final   Influenza B by PCR NEGATIVE NEGATIVE Final    Comment: (NOTE) The Xpert Xpress SARS-CoV-2/FLU/RSV plus assay is intended as an aid in the diagnosis of influenza from Nasopharyngeal swab specimens and should not be used as a sole basis for treatment. Nasal washings and aspirates are unacceptable for Xpert Xpress SARS-CoV-2/FLU/RSV testing.  Fact Sheet for Patients: EntrepreneurPulse.com.au  Fact Sheet for Healthcare Providers: IncredibleEmployment.be  This test is not yet approved or cleared by the [place] FDA and has been authorized for detection and/or diagnosis of SARS-CoV-2 by FDA under an Emergency Use Authorization (EUA). This EUA will remain in effect (meaning this test can be used) for the duration of the COVID-19 declaration under Section 564(b)(1) of the Act, 21 U.S.C. section 360bbb-3(b)(1), unless the authorization is terminated or revoked.  Performed at [facility], [place] [address], [place], [place] [address]   Blood Culture (routine x 2)     Status: None (Preliminary result)   Collection Time: 09/22/20  1:08 PM   Specimen: BLOOD  Result Value Ref Range Status   Specimen Description   Final    BLOOD LEFT ANTECUBITAL Performed at [facility] [address] [place], [place] [address]    Special Requests   Final    BOTTLES DRAWN AEROBIC AND ANAEROBIC Blood Culture adequate volume Performed at [facility] [address] [place], [place] [address]    Culture   Final    NO GROWTH < 12 HOURS Performed at [facility] [address], [place], [place] [address]    Report Status PENDING  Incomplete  Aerobic/Anaerobic Culture w Gram Stain (surgical/deep wound)     Status: None (Preliminary result)   Collection Time: 09/22/20 10:05 PM   Specimen: Synovial, Right Elbow; Tissue  Result Value Ref Range Status   Specimen Description   Final    SYNOVIAL Performed at [facility] [address], [place], [place] [address]    Special Requests   Final    NONE Performed at [facility], [place] [address], [place], [place] [address]    Gram Stain   Final    RARE WBC PRESENT,BOTH PMN AND MONONUCLEAR NO ORGANISMS SEEN Performed at [facility], [place] [address] [place], [place] [address]    Culture PENDING  Incomplete   Report Status PENDING  Incomplete  Aerobic/Anaerobic Culture w Gram Stain (surgical/deep wound)     Status: None (Preliminary result)   Collection Time: 09/22/20 10:05 PM   Specimen: Hand, Right; Body Fluid  Result Value Ref Range Status   Specimen Description   Final    HAND Performed at [facility], [place] [address] [place], [place] [address]    Special Requests   Final    NONE Performed at [facility], [place] [address] [place], [place] [address]    Gram Stain   Final    RARE WBC PRESENT, PREDOMINANTLY PMN NO ORGANISMS SEEN Performed at [facility], [place] [address], [place], [place] [address]    Culture PENDING  Incomplete   Report Status PENDING  Incomplete     Radiology Studies: DG Chest Port 1 View  Result Date: 09/22/2020 CLINICAL DATA:  Questionable sepsis.  Evaluate for abnormality. EXAM: PORTABLE CHEST 1 VIEW COMPARISON:  Chest CT 08/23/20 FINDINGS: Electronic device is positioned over the right upper lobe which precludes evaluation of this area. Stable cardiac  enlargement. No pleural effusion or edema. No airspace opacities identified. Remote left posterior rib fractures. Previous right shoulder arthroplasty. IMPRESSION: No acute cardiopulmonary abnormalities. Electronically Signed   By: Kerby Moors M.D.   On: 09/22/2020 14:23   DG Hand Complete Right  Result Date: 09/22/2020 CLINICAL DATA:  Questionable sepsis - evaluate for abnormality EXAM: RIGHT HAND - COMPLETE 3+ VIEW COMPARISON:  Radiograph 05/25/2020 FINDINGS: There is no acute fracture or dislocation. Prior resection of the trapezium. Mild radiocarpal and intercarpal joint degenerative change. There is volar subluxation of the middle finger MCP joint and possibly the index finger MCP joint. There is moderate-severe interphalangeal joint degenerative change, worst at the proximal phalangeal joints of the third through fifth digits. There is prominent dorsal soft tissue swelling. There are soft tissue calcifications along the thumb and heterotopic ossification along the volar soft tissues adjacent to the fourth metacarpal, unchanged from prior exam. There is no new osseous erosion or destruction. IMPRESSION: Prominent dorsal soft tissue swelling. Scattered osteoarthritis with prior trapeziectomy. Volar subluxation of the third MCP joint and possibly the index finger MCP joint which may be chronic. No radiographic evidence of osteomyelitis. Electronically Signed   By: Maurine Simmering   On: 09/22/2020 14:29   DG Chest Port 1 View  Final Result    DG Hand Complete Right  Final Result      Scheduled Meds:  allopurinol  100 mg Oral Daily   arformoterol  15 mcg Nebulization BID   And   umeclidinium bromide  1 puff Inhalation Daily   atorvastatin  10 mg Oral Daily   bifidobacterium infantis  1 capsule Oral Daily   cyproheptadine  8 mg Oral QPM   docusate sodium  100 mg Oral BID   [START ON 09/24/2020] famotidine  20 mg Oral Daily   fluconazole  100 mg Oral Daily   fluticasone  2 spray Each Nare Daily    folic acid  1 mg Oral Daily   gabapentin  600 mg Oral QHS   guaiFENesin  600 mg Oral BID   insulin aspart  0-15 Units Subcutaneous TID WC   insulin aspart  0-5 Units Subcutaneous QHS   loratadine  10 mg Oral Daily   magnesium oxide  400 mg Oral Daily   mirtazapine  30 mg Oral QHS   montelukast  10 mg Oral Daily   multivitamin with minerals  1 tablet Oral Daily   pantoprazole  40 mg Oral QPM   predniSONE  15 mg Oral Q breakfast   Propylene Glycol  1 drop Both Eyes BID   rivaroxaban  20 mg Oral Q supper   PRN Meds: albuterol, albuterol, azelastine, bisacodyl, fentaNYL (SUBLIMAZE) injection, ondansetron **OR** ondansetron (ZOFRAN) IV, polyethylene glycol, senna Continuous Infusions:  [START ON 09/24/2020] vancomycin       LOS: 1 day  Time spent: Greater than 50% of  the 35 minute visit was spent in counseling/coordination of care for the patient as laid out in the A&P.   Dwyane Dee, MD [facility] 09/23/2020, 12:36 PM

## 2020-09-23 NOTE — Assessment & Plan Note (Addendum)
- ?  hx hyperthryoidism, likely treated - no synthroid on med rec - check TSH = normal, 0.983

## 2020-09-23 NOTE — Assessment & Plan Note (Addendum)
-  patient has history of CKD3b. Baseline creat ~ 1.2 - 1.4, eGFR 32-41 - creat 1.59 on admission; not far off baseline; some fluctuation expected while meds adjusted - continue trending BMP daily

## 2020-09-23 NOTE — Progress Notes (Addendum)
Subjective: 1 Day Post-Op s/p Procedure(s): IRRIGATION AND DEBRIDEMENT RIGHT HAND AND ELBOW   Patient is alert, oriented. Reports pain as moderate at right hand and elbow.  Denies chest pain or calf pain. No nausea/vomiting. + SOB on 3 L of O2    Objective:  PE: VITALS:   Vitals:   09/22/20 2332 09/23/20 0337 09/23/20 0735 09/23/20 0908  BP:  122/66 (!) 162/94   Pulse:  87 92   Resp:  16 18   Temp:  97.9 F (36.6 C) 97.6 F (36.4 C)   TempSrc:  Oral Oral   SpO2: 93% 97% 100% 94%  Weight:      Height:       MSK: RUE in posterior splint, dressing not removed. Able to flex and extend all fingers of right hand. Distal sensation and capillary refill intact.   LABS  Results for orders placed or performed during the hospital encounter of 09/22/20 (from the past 24 hour(s))  Lactic acid, plasma     Status: None   Collection Time: 09/22/20  1:02 PM  Result Value Ref Range   Lactic Acid, Venous 1.6 0.5 - 1.9 mmol/L  Comprehensive metabolic panel     Status: Abnormal   Collection Time: 09/22/20  1:02 PM  Result Value Ref Range   Sodium 138 135 - 145 mmol/L   Potassium 3.9 3.5 - 5.1 mmol/L   Chloride 98 98 - 111 mmol/L   CO2 27 22 - 32 mmol/L   Glucose, Bld 157 (H) 70 - 99 mg/dL   BUN 29 (H) 8 - 23 mg/dL   Creatinine, Ser 1.59 (H) 0.44 - 1.00 mg/dL   Calcium 10.0 8.9 - 10.3 mg/dL   Total Protein 6.5 6.5 - 8.1 g/dL   Albumin 3.3 (L) 3.5 - 5.0 g/dL   AST 53 (H) 15 - 41 U/L   ALT 72 (H) 0 - 44 U/L   Alkaline Phosphatase 82 38 - 126 U/L   Total Bilirubin 1.1 0.3 - 1.2 mg/dL   GFR, Estimated 34 (L) >60 mL/min   Anion gap 13 5 - 15  CBC WITH DIFFERENTIAL     Status: Abnormal   Collection Time: 09/22/20  1:02 PM  Result Value Ref Range   WBC 14.5 (H) 4.0 - 10.5 K/uL   RBC 3.53 (L) 3.87 - 5.11 MIL/uL   Hemoglobin 11.7 (L) 12.0 - 15.0 g/dL   HCT 37.3 36.0 - 46.0 %   MCV 105.7 (H) 80.0 - 100.0 fL   MCH 33.1 26.0 - 34.0 pg   MCHC 31.4 30.0 - 36.0 g/dL   RDW 16.7 (H)  11.5 - 15.5 %   Platelets 225 150 - 400 K/uL   nRBC 0.1 0.0 - 0.2 %   Neutrophils Relative % 74 %   Neutro Abs 10.6 (H) 1.7 - 7.7 K/uL   Lymphocytes Relative 7 %   Lymphs Abs 1.1 0.7 - 4.0 K/uL   Monocytes Relative 15 %   Monocytes Absolute 2.2 (H) 0.1 - 1.0 K/uL   Eosinophils Relative 2 %   Eosinophils Absolute 0.4 0.0 - 0.5 K/uL   Basophils Relative 0 %   Basophils Absolute 0.1 0.0 - 0.1 K/uL   Immature Granulocytes 2 %   Abs Immature Granulocytes 0.29 (H) 0.00 - 0.07 K/uL  Protime-INR     Status: None   Collection Time: 09/22/20  1:02 PM  Result Value Ref Range   Prothrombin Time 13.1 11.4 - 15.2 seconds  INR 1.0 0.8 - 1.2  Blood Culture (routine x 2)     Status: None (Preliminary result)   Collection Time: 09/22/20  1:03 PM   Specimen: BLOOD  Result Value Ref Range   Specimen Description      BLOOD RIGHT ANTECUBITAL Performed at [facility], [place] [address] [place], [place] [address]    Special Requests      BOTTLES DRAWN AEROBIC AND ANAEROBIC Blood Culture adequate volume Performed at [facility] [address], [place], [place] [address]    Culture      NO GROWTH < 12 HOURS Performed at [facility] [address], [place], [place] [address]    Report Status PENDING   Resp Panel by RT-PCR (Flu A&B, Covid) Nasopharyngeal Swab     Status: None   Collection Time: 09/22/20  1:04 PM   Specimen: Nasopharyngeal Swab; Nasopharyngeal(NP) swabs in vial transport medium  Result Value Ref Range   SARS Coronavirus 2 by RT PCR NEGATIVE NEGATIVE   Influenza A by PCR NEGATIVE NEGATIVE   Influenza B by PCR NEGATIVE NEGATIVE  Blood Culture (routine x 2)     Status: None (Preliminary result)   Collection Time: 09/22/20  1:08 PM   Specimen: BLOOD  Result Value Ref Range   Specimen Description      BLOOD LEFT ANTECUBITAL Performed at [facility], [place] [address], [place], [place] [address]    Special Requests       BOTTLES DRAWN AEROBIC AND ANAEROBIC Blood Culture adequate volume Performed at [facility] [address] [place], [place] [address]    Culture      NO GROWTH < 12 HOURS Performed at [facility] [address], [place], [place] [address]    Report Status PENDING   Brain natriuretic peptide     Status: Abnormal   Collection Time: 09/22/20  2:26 PM  Result Value Ref Range   B Natriuretic Peptide 424.3 (H) 0.0 - 100.0 pg/mL  CBG monitoring, ED     Status: Abnormal   Collection Time: 09/22/20  5:03 PM  Result Value Ref Range   Glucose-Capillary 145 (H) 70 - 99 mg/dL  Aerobic/Anaerobic Culture w Gram Stain (surgical/deep wound)     Status: None (Preliminary result)   Collection Time: 09/22/20 10:05 PM   Specimen: Synovial, Right Elbow; Tissue  Result Value Ref Range   Specimen Description      SYNOVIAL Performed at [facility], [place] [address], [place], [place] [address]    Special Requests      NONE Performed at [facility], [place] [address] [place], [place] [address]    Gram Stain      RARE WBC PRESENT,BOTH PMN AND MONONUCLEAR NO ORGANISMS SEEN Performed at [facility], [place] [address], [place], [place] [address]    Culture PENDING    Report Status PENDING   Aerobic/Anaerobic Culture w Gram Stain (surgical/deep wound)     Status: None (Preliminary result)   Collection Time: 09/22/20 10:05 PM   Specimen: Hand, Right; Body Fluid  Result Value Ref Range   Specimen Description      HAND Performed at [facility] [address] [place], [place] [address]    Special Requests      NONE Performed at [facility], [place] [address] [place],  [address]    Gram Stain  RARE WBC PRESENT, PREDOMINANTLY PMN NO ORGANISMS SEEN Performed at [facility] [address], [place], [place] [address]    Culture PENDING    Report Status PENDING   Lactic  acid, plasma     Status: None   Collection Time: 09/22/20 11:52 PM  Result Value Ref Range   Lactic Acid, Venous 1.3 0.5 - 1.9 mmol/L  Hemoglobin A1c     Status: Abnormal   Collection Time: 09/22/20 11:52 PM  Result Value Ref Range   Hgb A1c MFr Bld 8.1 (H) 4.8 - 5.6 %   Mean Plasma Glucose 185.77 mg/dL  CBC     Status: Abnormal   Collection Time: 09/22/20 11:52 PM  Result Value Ref Range   WBC 16.7 (H) 4.0 - 10.5 K/uL   RBC 3.26 (L) 3.87 - 5.11 MIL/uL   Hemoglobin 10.9 (L) 12.0 - 15.0 g/dL   HCT 35.1 (L) 36.0 - 46.0 %   MCV 107.7 (H) 80.0 - 100.0 fL   MCH 33.4 26.0 - 34.0 pg   MCHC 31.1 30.0 - 36.0 g/dL   RDW 16.6 (H) 11.5 - 15.5 %   Platelets 193 150 - 400 K/uL   nRBC 0.0 0.0 - 0.2 %  Creatinine, serum     Status: Abnormal   Collection Time: 09/22/20 11:52 PM  Result Value Ref Range   Creatinine, Ser 1.60 (H) 0.44 - 1.00 mg/dL   GFR, Estimated 34 (L) >60 mL/min  Glucose, capillary     Status: Abnormal   Collection Time: 09/23/20 12:05 AM  Result Value Ref Range   Glucose-Capillary 171 (H) 70 - 99 mg/dL  Comprehensive metabolic panel     Status: Abnormal   Collection Time: 09/23/20 12:10 AM  Result Value Ref Range   Sodium 136 135 - 145 mmol/L   Potassium 5.3 (H) 3.5 - 5.1 mmol/L   Chloride 98 98 - 111 mmol/L   CO2 25 22 - 32 mmol/L   Glucose, Bld 175 (H) 70 - 99 mg/dL   BUN 28 (H) 8 - 23 mg/dL   Creatinine, Ser 1.59 (H) 0.44 - 1.00 mg/dL   Calcium 9.3 8.9 - 10.3 mg/dL   Total Protein 6.2 (L) 6.5 - 8.1 g/dL   Albumin 3.0 (L) 3.5 - 5.0 g/dL   AST 62 (H) 15 - 41 U/L   ALT 60 (H) 0 - 44 U/L   Alkaline Phosphatase 75 38 - 126 U/L   Total Bilirubin 1.9 (H) 0.3 - 1.2 mg/dL   GFR, Estimated 34 (L) >60 mL/min   Anion gap 13 5 - 15  CBC     Status: Abnormal   Collection Time: 09/23/20 12:10 AM  Result Value Ref Range   WBC 16.8 (H) 4.0 - 10.5 K/uL   RBC 3.32 (L) 3.87 - 5.11 MIL/uL   Hemoglobin 10.9 (L) 12.0 - 15.0 g/dL   HCT 35.6 (L) 36.0 - 46.0 %   MCV 107.2 (H) 80.0  - 100.0 fL   MCH 32.8 26.0 - 34.0 pg   MCHC 30.6 30.0 - 36.0 g/dL   RDW 16.7 (H) 11.5 - 15.5 %   Platelets 189 150 - 400 K/uL   nRBC 0.0 0.0 - 0.2 %  Glucose, capillary     Status: Abnormal   Collection Time: 09/23/20  7:22 AM  Result Value Ref Range   Glucose-Capillary 223 (H) 70 - 99 mg/dL   Comment 1 Notify RN    *Note: Due to a large number of results  and/or encounters for the requested time period, some results have not been displayed. A complete set of results can be found in Results Review.    DG Chest Port 1 View  Result Date: 09/22/2020 CLINICAL DATA:  Questionable sepsis.  Evaluate for abnormality. EXAM: PORTABLE CHEST 1 VIEW COMPARISON:  Chest CT 08/23/20 FINDINGS: Electronic device is positioned over the right upper lobe which precludes evaluation of this area. Stable cardiac enlargement. No pleural effusion or edema. No airspace opacities identified. Remote left posterior rib fractures. Previous right shoulder arthroplasty. IMPRESSION: No acute cardiopulmonary abnormalities. Electronically Signed   By: Kerby Moors M.D.   On: 09/22/2020 14:23   DG Hand Complete Right  Result Date: 09/22/2020 CLINICAL DATA:  Questionable sepsis - evaluate for abnormality EXAM: RIGHT HAND - COMPLETE 3+ VIEW COMPARISON:  Radiograph 05/25/2020 FINDINGS: There is no acute fracture or dislocation. Prior resection of the trapezium. Mild radiocarpal and intercarpal joint degenerative change. There is volar subluxation of the middle finger MCP joint and possibly the index finger MCP joint. There is moderate-severe interphalangeal joint degenerative change, worst at the proximal phalangeal joints of the third through fifth digits. There is prominent dorsal soft tissue swelling. There are soft tissue calcifications along the thumb and heterotopic ossification along the volar soft tissues adjacent to the fourth metacarpal, unchanged from prior exam. There is no new osseous erosion or destruction. IMPRESSION:  Prominent dorsal soft tissue swelling. Scattered osteoarthritis with prior trapeziectomy. Volar subluxation of the third MCP joint and possibly the index finger MCP joint which may be chronic. No radiographic evidence of osteomyelitis. Electronically Signed   By: Maurine Simmering   On: 09/22/2020 14:29    Assessment/Plan: Septic right elbow olecranon bursitis Right dorsal infected rheumatoid nodule, hand, overlying the third metacarpal phalangeal joint. - s/p right olecranon bursae and MCP nodule irrigation and debridement - Hx of 3 previous aspirations in our office, the second of which resulted in growth of candida albicans from both olecranon bursa and MCP nodule, treated as outpatient with oral diflucan. She had been improving clinically however presented to our office on 09/22/20 with increased pain and erythema  Weightbearing: NWB RUE Insicional and dressing care: No drainage seen today. Plan to change dressing tomorrow. VTE prophylaxis: ok to restart Xarelto from ortho perspective Pain control: continue current regimen - will need OT and PT while inpatient - no growth yet on cultures, will consult ID today  Follow - up plan: 1 week after discharge  Contact information:   Weekdays 8-5 Merlene Pulling, PA-C [phone] A fter hours and holidays please check [email or web address] for group call information for [facility] 09/23/2020, 9:58 AM

## 2020-09-23 NOTE — Assessment & Plan Note (Signed)
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

## 2020-09-23 NOTE — Hospital Course (Addendum)
Ebony Scott is a 70 yo female with PMH DVT, anxiety/depression, RA, breast cancer, cdiff, diverticulosis, fibromyalgia, GERD, colonic polyps, HLD, DMII, CKD3b, hypothyroidism, IBS, moderate persistent asthma, osteoporosis, PVD, tracheobronchomalacia who presented with right hand pain and swelling.  She has been managed outpatient recently by ortho due to this; she has undergone previous aspirations and been seen by ID in past.  She was admitted for ortho evaluation for further debridement of her right hand and right elbow, which was performed on 09/22/20. ID will again follow inpatient for assistance with her complex recurrent infections.  Cardiology was also consulted after her recent outpatient echo showed reduced EF and need for further workup and treatment.

## 2020-09-23 NOTE — Assessment & Plan Note (Addendum)
-   A1c 8.4% on 09/22/20 - recently to be started on Farxiga (see above) outpatient to aid in CHF treatment as well - continue SSI and CBG monitoring  - will probably need insulin at discharge (just sliding scale likely)

## 2020-09-23 NOTE — Assessment & Plan Note (Addendum)
-   followed closely by pulmonology outpatient - s/p BAL on 04/05/20; grew C. Albicans at that time - CT on 4/6 showed improved/decrease in size of nodules compared to 2/4 scan

## 2020-09-23 NOTE — Consult Note (Signed)
[facility]    Date of Admission:  09/22/2020     Reason for Consult:Septic olecranon bursitis, abscess of right hand     Referring Physician: Dr Sabino Gasser  Current antibiotics: Daptomycin 8/5-pres Fluconazole 8/5-pres  ASSESSMENT:    Septic olecranon bursitis: Previous cultures have grown Candida albicans and she completed a 3-week course of fluconazole in June 2022.  She underwent excisional debridement and bursectomy on 09/22/2020 with orthopedics.  She is currently on vancomycin and fluconazole with cultures pending. Rheumatoid nodule/abscess of the right hand: Similarly she is status post excisional debridement of the dorsal rheumatoid nodule and I&D of the third MCP 09/22/2020 with orthopedic surgery. Diabetes: A1c 8.4 History of laryngeal candidiasis: She was seen as recently as 7/22 by ENT.  At that time there was no evidence of fungal infection on laryngoscope. Rheumatoid arthritis CKD: Creatinine near baseline  PLAN:    Continue fluconazole.  Pharmacy has adjusted the dosage based on her renal function Will transition from vancomycin to daptomycin given her renal insufficiency.  Check a baseline CK Follow-up pending cultures Wound care, glycemic control Hopefully can avoid a PICC line with her since there did not appear to be any bone or joint involvement and presumably had good control of her infection with the debridement. Dr. West Bali is available as needed over the weekend.  I will be back Monday.    Principal Problem:   Septic olecranon bursitis of right elbow Active Problems:   Hyperlipidemia   GERD   Chronic combined systolic and diastolic CHF (congestive heart failure) (HCC)   Chronic kidney disease, stage 3b (HCC)   Pulmonary nodules   Hypothyroidism   Rheumatoid arthritis (HCC)   Tracheobronchomalacia   Cellulitis of right hand   Abscess of dorsum of right hand   Laryngeal candidiasis   Osteomyelitis of second toe of right foot  (HCC)   DMII (diabetes mellitus, type 2) (HCC)   MEDICATIONS:    Scheduled Meds:  allopurinol  100 mg Oral Daily   arformoterol  15 mcg Nebulization BID   And   umeclidinium bromide  1 puff Inhalation Daily   atorvastatin  10 mg Oral Daily   cyproheptadine  8 mg Oral QPM   docusate sodium  100 mg Oral BID   [START ON 09/24/2020] famotidine  20 mg Oral Daily   fluconazole  200 mg Oral Daily   fluticasone  2 spray Each Nare Daily   folic acid  1 mg Oral Daily   gabapentin  600 mg Oral QHS   guaiFENesin  600 mg Oral BID   insulin aspart  0-15 Units Subcutaneous TID WC   insulin aspart  0-5 Units Subcutaneous QHS   loratadine  10 mg Oral Daily   magnesium oxide  400 mg Oral Daily   mirtazapine  30 mg Oral QHS   montelukast  10 mg Oral Daily   multivitamin with minerals  1 tablet Oral Daily   pantoprazole  40 mg Oral QPM   [START ON 09/24/2020] predniSONE  15 mg Oral Q breakfast   rivaroxaban  20 mg Oral Q supper   tetrahydrozoline  1 drop Both Eyes BID   Continuous Infusions:  [START ON 09/24/2020] DAPTOmycin (CUBICIN)  IV     PRN Meds:.albuterol, azelastine, bisacodyl, fentaNYL (SUBLIMAZE) injection, ondansetron **OR** ondansetron (ZOFRAN) IV, polyethylene glycol, senna  HPI:    Ebony Scott is a 70 y.o. female with a past medical history of DVT, anxiety, rheumatoid arthritis, breast  cancer, C. difficile, fibromyalgia, GERD, hyperlipidemia, diabetes, CKD, asthma, PVD, tracheobronchomalacia who presented yesterday with right hand pain/swelling and right elbow pain and swelling.  She was taken to the OR last evening with Dr. Mardelle Matte for excisional debridement, right olecranon bursa with olecranon bursectomy and I&D as well as excisional debridement, right dorsal rheumatoid nodule, overlying the long metacarpal phalangeal joint with I&D.  I actually saw patient on 08/05/2020 for this issue with olecranon bursitis.  In summary, she was seen previously by her orthopedist on 05/13/2020 with a  complaint of new onset swelling in the right elbow in the setting of a recent right reverse shoulder replacement.  At that time there was not much concern for septic bursitis and she underwent steroid injection.  She was then seen on 4/29 for follow-up and was noted to have a little bit of redness and bogginess around the right olecranon bursa.  She was started on doxycycline at that time.  She was seen again on 5/18 and noted persistent pain and swelling around her right elbow that had started to track a little more proximally.  She also noted a large soft tissue swelling over the right long metacarpal phalangeal joint.  She did not have any fevers or chills.  She had both of these areas aspirated at that time cultures from her right elbow grew Candida albicans and she was started on fluconazole.  She had also been on doxycycline but those antibiotics were stopped at that time.  Ultimately she completed a 21-day course of fluconazole with her right elbow showing improvement.  At the time of our visit she did not have any warmth or erythema and had good range of motion.  Approximately 2 weeks after that visit she began having increasing swelling of her right hand.  She reports this was aspirated approximately 3 times in the outpatient clinic but she does not recall receiving any further antibiotics.  Earlier this week she then began having redness and swelling along with pain in her right elbow.  She subsequently presented to the ED yesterday for surgical management.  Per the operative note there was significant purulence in the olecranon bursa extending down overlying the tendon of the triceps but there was no further tracking deeper down into the joint.  There was also a substantial amount of yellow gelatinous material present in the rheumatoid nodule in her hand.  This also did not track down to the joint.  Her cultures at this time are no growth.  She has been afebrile with a leukocytosis of 16.8.  She is  currently receiving vancomycin and fluconazole.   Past Medical History:  Diagnosis Date   Abscess of dorsum of right hand 09/22/2020   Acute deep vein thrombosis (DVT) of right lower extremity ([place]) 12/13/2017   Allergic rhinitis    Allergy    SEASONAL   Anemia    Angio-edema    Anxiety    pt denies   Arthritis    Phreesia 10/21/2019   Asthma    Asthma    Phreesia 10/21/2019   Breast cancer ([place]) 1998   in remission   Cataract    REMOVED   Clostridium difficile colitis 01/14/2018   Clostridium difficile diarrhea [id number]   Complication of anesthesia    "had hard time waking up from it several times" (02/20/2012)   Depression    "some; don't take anything for it" (02/20/2012)   Diverticulosis    DVT (deep venous thrombosis) (HCC)    Exertional dyspnea  Fibromyalgia 11/2011   GERD (gastroesophageal reflux disease)    Graves disease    Headache(784.0)    "related to allergies; more at different times during the year" (02/20/2012)   Hemorrhoids    Hiatal hernia    back and neck   Hx of adenomatous colonic polyps 04/12/2016   Hypercholesteremia    good cholesterol is high   Hypothyroidism    IBS (irritable bowel syndrome)    Moderate persistent asthma    -FeV1 72% 2011, -IgE 102 2011, CT sinus Neg 2011   Osteoporosis    on reclast yearly   Peripheral vascular disease ([place]) 2019   DVTs   Pneumonia 04/2011; ~ 11/2011   "double; single" (02/20/2012)   Recurrent upper respiratory infection (URI)    Septic olecranon bursitis of right elbow 09/22/2020   Seronegative rheumatoid arthritis ([place])    Dr. Lahoma Rocker   SIRS (systemic inflammatory response syndrome) ([place]) 02/10/2018   Tracheobronchomalacia     Social History   Tobacco Use   Smoking status: Never    Passive exposure: Yes   Smokeless tobacco: Never   Tobacco comments:    Parents  Vaping Use   Vaping Use: Never used  Substance Use Topics   Alcohol use: Not Currently    Alcohol/week: 0.0 standard drinks    Drug use: No    Family History  Problem Relation Age of Onset   Allergies Mother    Heart disease Mother    Arthritis Mother    Lung cancer Mother    Diabetes Mother    Allergies Father    Heart disease Father    Arthritis Father    Stroke Father    Colon cancer Other        Maternal half aunt/Maternal half uncle   Colitis Daughter    Diabetes Maternal Grandfather     Allergies  Allergen Reactions   Baclofen Other (See Comments)    Altered mental status requiring a 3-day hospital stay- patient became unresponsive   Dust Mite Extract Shortness Of Breath and Other (See Comments)    "sneezing" (02/20/2012)   Molds & Smuts Shortness Of Breath   Morphine And Related Hives and Itching   Other Shortness Of Breath and Other (See Comments)    Grass and weeds "sneezing; filled sinuses" (02/20/2012)   Penicillins Rash and Other (See Comments)    "welts" (02/20/2012) Tolerated Ancef 02/16/20.    Rofecoxib Swelling    Vioxx REACTION: feet swelling Other reaction(s): Unknown Other reaction(s): Unknown   Shellfish Allergy Anaphylaxis, Shortness Of Breath, Itching and Rash   Shrimp Flavor Anaphylaxis and Other (See Comments)    ALL SHELLFISH, also   Tetracycline Hcl Nausea And Vomiting   Xolair [Omalizumab] Other (See Comments)    Caused Blood clot   Zoledronic Acid Other (See Comments)    Fever, Put in hospital, dr said it was a reaction from a reaction  Other reaction(s): Unknown   Dilaudid [Hydromorphone Hcl] Itching   Hydrocodone-Acetaminophen Nausea And Vomiting   Levofloxacin Other (See Comments)    GI upset Other reaction(s): Unknown   Oxycodone Hcl Nausea And Vomiting   Paroxetine Nausea And Vomiting    Paxil    Celecoxib Swelling    Feet swelling   Diltiazem Swelling   Diltiazem Hcl     Other reaction(s): Unknown   Hydrocodone-Acetaminophen     Other reaction(s): Unknown   Lactose Intolerance (Gi) Other (See Comments)    Bloating and gas   Morphine Sulfate Other  (  See Comments)    "Gets loopy"   Oxycodone-Acetaminophen     Other reaction(s): Unknown   Penicillin G Procaine     Other reaction(s): Unknown   Strawberry Flavor    Tree Extract Other (See Comments)    "tested and told I was allergic to it; never experienced a reaction to it" (02/20/2012)    Review of Systems  Constitutional:  Negative for chills and fever.  HENT: Negative.    Respiratory:  Positive for shortness of breath. Negative for cough and sputum production.   Cardiovascular: Negative.   Gastrointestinal: Negative.   Genitourinary: Negative.   Musculoskeletal:  Positive for joint pain.  Skin: Negative.   All other systems reviewed and are negative.  OBJECTIVE:   Blood pressure (!) 157/92, pulse 97, temperature 97.8 F (36.6 C), temperature source Oral, resp. rate 18, height '5\' 1"'$  (1.549 m), weight 63.5 kg, SpO2 96 %. Body mass index is 26.45 kg/m.  Physical Exam Constitutional:      General: She is not in acute distress.    Appearance: Normal appearance.  HENT:     Head: Normocephalic and atraumatic.  Pulmonary:     Comments: On [place], mildly increased WOB.  Asking for breathing treatment.  Abdominal:     General: There is no distension.     Palpations: Abdomen is soft.  Musculoskeletal:     Comments: Right upper extremity wrapped in surgical dressing  Skin:    General: Skin is warm and dry.     Findings: No rash.  Neurological:     General: No focal deficit present.     Mental Status: She is alert and oriented to person, place, and time.  Psychiatric:        Mood and Affect: Mood normal.        Behavior: Behavior normal.     Lab Results: Lab Results  Component Value Date   WBC 16.8 (H) 09/23/2020   HGB 10.9 (L) 09/23/2020   HCT 35.6 (L) 09/23/2020   MCV 107.2 (H) 09/23/2020   PLT 189 09/23/2020    Lab Results  Component Value Date   NA 136 09/23/2020   K 5.3 (H) 09/23/2020   CO2 25 09/23/2020   GLUCOSE 175 (H) 09/23/2020   BUN 28 (H) 09/23/2020    CREATININE 1.59 (H) 09/23/2020   CALCIUM 9.3 09/23/2020   GFRNONAA 34 (L) 09/23/2020   GFRAA 40 (L) 08/25/2020    Lab Results  Component Value Date   ALT 60 (H) 09/23/2020   AST 62 (H) 09/23/2020   ALKPHOS 75 09/23/2020   BILITOT 1.9 (H) 09/23/2020       Component Value Date/Time   CRP 17.1 (H) 03/08/2018 0310       Component Value Date/Time   ESRSEDRATE 41 (H) 07/29/2018 1014    I have reviewed the micro and lab results in Epic.  Imaging: DG Chest Port 1 View  Result Date: 09/22/2020 CLINICAL DATA:  Questionable sepsis.  Evaluate for abnormality. EXAM: PORTABLE CHEST 1 VIEW COMPARISON:  Chest CT 08/23/20 FINDINGS: Electronic device is positioned over the right upper lobe which precludes evaluation of this area. Stable cardiac enlargement. No pleural effusion or edema. No airspace opacities identified. Remote left posterior rib fractures. Previous right shoulder arthroplasty. IMPRESSION: No acute cardiopulmonary abnormalities. Electronically Signed   By: Kerby Moors M.D.   On: 09/22/2020 14:23   DG Hand Complete Right  Result Date: 09/22/2020 CLINICAL DATA:  Questionable sepsis - evaluate for abnormality EXAM: RIGHT HAND -  COMPLETE 3+ VIEW COMPARISON:  Radiograph 05/25/2020 FINDINGS: There is no acute fracture or dislocation. Prior resection of the trapezium. Mild radiocarpal and intercarpal joint degenerative change. There is volar subluxation of the middle finger MCP joint and possibly the index finger MCP joint. There is moderate-severe interphalangeal joint degenerative change, worst at the proximal phalangeal joints of the third through fifth digits. There is prominent dorsal soft tissue swelling. There are soft tissue calcifications along the thumb and heterotopic ossification along the volar soft tissues adjacent to the fourth metacarpal, unchanged from prior exam. There is no new osseous erosion or destruction. IMPRESSION: Prominent dorsal soft tissue swelling. Scattered  osteoarthritis with prior trapeziectomy. Volar subluxation of the third MCP joint and possibly the index finger MCP joint which may be chronic. No radiographic evidence of osteomyelitis. Electronically Signed   By: Maurine Simmering   On: 09/22/2020 14:29     Imaging independently reviewed in Epic.  [facility] [phone] pager 09/23/2020, 3:49 PM

## 2020-09-23 NOTE — Assessment & Plan Note (Signed)
-   treated with rituxan and prednisone 15 mg daily

## 2020-09-23 NOTE — Assessment & Plan Note (Addendum)
-   has been followed by ID in the past; last seen in June 2022; has been on several courses of fluconazole for various albicans infections (see below as well) - underwent excisional debridement on 8/4 with ortho - ID following; see abscess hand too

## 2020-09-23 NOTE — Consult Note (Signed)
Cardiology Consultation:   Patient ID: Ebony Scott MRN: [id number]; DOB: 1950/08/08  Admit date: 09/22/2020 Date of Consult: 09/23/2020  PCP:  [name], MD   [facility] Providers Cardiologist:  Buford Dresser, MD   {  Patient Profile:   Ebony Scott is a 70 y.o. female with a history of diffuse aorta and coronary calcifications noted on chest CT in 05/812, chronic diastolic CHF, SVT, prior PE on lifelong anticoagulation, asthma, tracheobronchomalacia on CPAP, vocal cord dysfunction, seronegative rheumatoid arthritis, and CKD stage III, who is being seen for the evaluation of newly reduced EF of 35-40% at the request of Dr. Sabino Gasser.  History of Present Illness:   Ebony Scott is a 70 year old female with the above history who is followed by Dr. Harrell Gave. Last Echo in 06/2019 showed LVEF of >75% with normal wall motion, moderate LVH, and grade 1 diastolic dysfunction. RV size/function normal. She was last seen by Dr. Harrell Gave in 03/2020 at which time she continued to have shortness of breath but no other cardiac complaints. She was undergoing pulmonary evaluation at the time and it was suspected that dyspnea was largely pulmonary in nature. CT in 03/2020 showed multifocal areas of nodularity and consolidative changes consistent with atypical infection or possible malignancy. She was treated with Azithromycin and Cefpodoxime as well as Levoquin. She also underwent bronchoscopy in 03/2020 and cultures were unrevealing. BAL samples did grow candida albicans and she was treated with 2 doses of Diflucan by ENT. This helped her voice but she continued to have shortness of breath. Per last Pulmonology note in 05/2020, pulmonary opacities most likely infectious or inflammatory in nature. She was P-ANCA positive so vasculitis could be a cause and she has already been started on Rituximab for rheumatoid arthritis.  Patient presented to the ED on 09/21/2020 for further evaluation of right arm/hand  pain and swelling. She has had a right hand ganglion cyst for the last 3 months and reportedly has undergone multiple I&Ds. Imaging did not show osteomyelitis Diagnosed with septic right elbow olecranon bursitis and cellulitis of right hand. Ortho was consulted and performed right olecranon bursae and MCP nodule I&Ds. ID was consulted and is managing antibiotic and antifungal therapy.  Recent Echo on 09/16/2020 prior to admission ordered by Dr. Dennard Schaumann showed LVEF of 35-40% with septal apical and anterior wall hypokinesis which is new. Plan was to start [place] and Losartan and advised to follow-up with Cardiology. Does not look like medications have been started yet. Cardiology consulted to go ahead and address this while inpatient.   She presented with hand pain.  However, she has recently been noted to have a reduced ejection fraction of unclear etiology.  She previously had some diastolic dysfunction.  She does take diuretic at home but has been off this for few days because she was fairly immobile in bed.  She does note some mildly increased swelling.  She has chronic shortness of breath which she ascribes to her asthma and she thinks has been slowly getting worse over time.  She denies any chest pressure, neck or arm discomfort.  She does not really have any new palpitations, presyncope or syncope.  She does not describe PND or orthopnea.  Past Medical History:  Diagnosis Date   Abscess of dorsum of right hand 09/22/2020   Acute deep vein thrombosis (DVT) of right lower extremity ([place]) 12/13/2017   Allergic rhinitis    Anemia    Angio-edema    Anxiety  pt denies   Arthritis    Phreesia 10/21/2019   Asthma    Phreesia 10/21/2019   Breast cancer ([place]) 1998   in remission   Cataract    REMOVED   Clostridium difficile colitis 01/14/2018   Clostridium difficile diarrhea 41/96/2229   Complication of anesthesia    "had hard time waking up from it several times" (02/20/2012)   Depression     "some; don't take anything for it" (02/20/2012)   Diverticulosis    DVT (deep venous thrombosis) ([place])    Fibromyalgia 11/2011   GERD (gastroesophageal reflux disease)    Graves disease    Headache(784.0)    "related to allergies; more at different times during the year" (02/20/2012)   Hemorrhoids    Hiatal hernia    back and neck   Hx of adenomatous colonic polyps 04/12/2016   Hypercholesteremia    good cholesterol is high   Hypothyroidism    IBS (irritable bowel syndrome)    Moderate persistent asthma    -FeV1 72% 2011, -IgE 102 2011, CT sinus Neg 2011   Osteoporosis    on reclast yearly   Peripheral vascular disease ([place]) 2019   DVTs   Pneumonia 04/2011; ~ 11/2011   "double; single" (02/20/2012)   Septic olecranon bursitis of right elbow 09/22/2020   Seronegative rheumatoid arthritis ([place])    Dr. Lahoma Rocker   SIRS (systemic inflammatory response syndrome) ([place]) 02/10/2018   Tracheobronchomalacia     Past Surgical History:  Procedure Laterality Date   ABDOMINAL HYSTERECTOMY N/A    Phreesia 10/21/2019   ANTERIOR AND POSTERIOR REPAIR  1990's   APPENDECTOMY     BREAST LUMPECTOMY  1998   left   BREAST SURGERY N/A    Phreesia 10/21/2019   BRONCHIAL WASHINGS  04/05/2020   Procedure: BRONCHIAL WASHINGS;  Surgeon: Freddi Starr, MD;  Location: [facility];  Service: Pulmonary;;   CARPOMETACARPEL ([place]) FUSION OF THUMB WITH AUTOGRAFT FROM RADIUS  ~ 2009   "both thumbs" (02/20/2012)   CATARACT EXTRACTION W/ INTRAOCULAR LENS  IMPLANT, BILATERAL  2012   CERVICAL DISCECTOMY  10/2001   C5-C6   CERVICAL FUSION  2003   C3-C4   CHOLECYSTECTOMY     COLONOSCOPY     DEBRIDEMENT TENNIS ELBOW  ?1970's   right   ESOPHAGOGASTRODUODENOSCOPY     EYE SURGERY N/A    Phreesia 10/21/2019   HYSTERECTOMY     I & D EXTREMITY Right 09/22/2020   Procedure: IRRIGATION AND DEBRIDEMENT RIGHT HAND AND ELBOW;  Surgeon: Marchia Bond, MD;  Location: [facility];  Service: Orthopedics;  Laterality: Right;    KNEE ARTHROPLASTY  ?1990's   "?right; w/cartilage repair" (02/20/2012)   NASAL SEPTUM SURGERY  1980's   POSTERIOR CERVICAL FUSION/FORAMINOTOMY  2004   "failed initial fusion; rewired  anterior neck" (02/20/2012)   REVERSE SHOULDER ARTHROPLASTY Right 02/16/2020   Procedure: REVERSE SHOULDER ARTHROPLASTY;  Surgeon: Marchia Bond, MD;  Location: [facility];  Service: Orthopedics;  Laterality: Right;   SPINE SURGERY N/A    Phreesia 10/21/2019   TONSILLECTOMY  ~ 1953   VESICOVAGINAL FISTULA CLOSURE W/ TAH  1988   VIDEO BRONCHOSCOPY Bilateral 08/23/2016   Procedure: VIDEO BRONCHOSCOPY WITH FLUORO;  Surgeon: Javier Glazier, MD;  [facility];  Service: Cardiopulmonary;  Laterality: Bilateral;   VIDEO BRONCHOSCOPY N/A 04/05/2020   Procedure: VIDEO BRONCHOSCOPY WITHOUT FLUORO;  Surgeon: Freddi Starr, MD;  [facility];  Service: Pulmonary;  Laterality: N/A;     Home  Medications:  Prior to Admission medications   Medication Sig Start Date End Date Taking? Authorizing Provider  acetaminophen (TYLENOL) 650 MG CR tablet Take 1,300 mg by mouth 2 (two) times daily.   Yes [provider]  acetic acid-hydrocortisone (VOSOL-HC) OTIC solution Place 4 drops into both ears as directed. First 5 days of the month   Yes [provider]  albuterol (PROVENTIL) (2.5 MG/3ML) 0.083% nebulizer solution USE 1 AMPULE IN NEBULIZER IN AM AT BEDTIME ALONG WITH FLUTTER VALVE Patient taking differently: Take 2.5 mg by nebulization daily as needed for wheezing or shortness of breath. 08/08/20  Yes Dewald, Cheryle Horsfall, MD  albuterol (VENTOLIN HFA) 108 (90 Base) MCG/ACT inhaler INHALE 2 PUFFS BY MOUTH INTO THE LUNGS EVERY 4 HOURS AS NEEDED FOR WHEEZING OR SHORTNESS OF BREATH Patient taking differently: Inhale 2 puffs into the lungs every 4 (four) hours as needed for wheezing or shortness of breath. 07/04/20  Yes Freddi Starr, MD  allopurinol (ZYLOPRIM) 100 MG tablet Take 100 mg by mouth  daily. 11/19/19  Yes [provider]  Alpha-D-Galactosidase (BEANO PO) Take 2-3 capsules by mouth 3 (three) times daily as needed (for gassy foods/drinks).   Yes [provider]  Alpha-Lipoic Acid 600 MG CAPS Take 600 mg by mouth daily.    Yes [provider]  atorvastatin (LIPITOR) 10 MG tablet TAKE 1 TABLET BY MOUTH DAILY Patient taking differently: Take 10 mg by mouth daily. 03/16/20  Yes [name], MD  azelastine (ASTELIN) 0.1 % nasal spray USE 2 SPRAYS IN EACH NOSTRIL DAILY AS DIRECTED Patient taking differently: Place 2 sprays into both nostrils at bedtime as needed for allergies. 01/02/19  Yes Kozlow, Donnamarie Poag, MD  cetirizine (ZYRTEC) 10 MG tablet Take 10 mg by mouth daily.   Yes [provider]  cholecalciferol (VITAMIN D) 25 MCG (1000 UT) tablet Take 1,000 Units by mouth daily.    Yes [provider]  cyproheptadine (PERIACTIN) 4 MG tablet TAKE 2 TABLETS BY MOUTH EVERY EVENING Patient taking differently: Take 8 mg by mouth every evening. 02/02/20  Yes PickardCammie Mcgee, MD  DEXILANT 60 MG capsule TAKE 1 CAPSULE BY MOUTH [facility] MORNING Patient taking differently: Take 60 mg by mouth daily. 02/02/20  Yes [name], MD  diclofenac Sodium (VOLTAREN) 1 % GEL Apply 2 g topically 4 (four) times daily as needed (joint pain). 05/26/20  Yes [name], MD  EPINEPHrine (EPIPEN 2-PAK) 0.3 mg/0.3 mL IJ SOAJ injection Inject 0.3 mLs (0.3 mg total) into the muscle Once PRN. Patient taking differently: Inject 0.3 mg into the muscle Once PRN for anaphylaxis. 01/23/19  Yes [name], MD  famotidine (PEPCID) 20 MG tablet TAKE 1 TABLET BY MOUTH TWICE A DAY Patient taking differently: Take 20 mg by mouth at bedtime. 02/01/20  Yes Kozlow, Donnamarie Poag, MD  fluticasone (FLONASE) 50 MCG/ACT nasal spray USE 2 SPRAYS IN EACH NOSTRIL DAILY Patient taking differently: Place 2 sprays into both nostrils daily. 09/02/20  Yes Freddi Starr, MD  folic acid  (FOLVITE) 1 MG tablet Take 1 mg by mouth daily.   Yes [provider]  furosemide (LASIX) 40 MG tablet Take 1 tablet (40 mg total) by mouth daily. 80 mg in the AM and 40 mg in the PM Patient taking differently: Take 40-80 mg by mouth daily. 65m in the morning 40 at lunch 08/30/20  Yes [name], WCammie Mcgee MD  gabapentin (NEURONTIN) 600 MG tablet TAKE 1 TABLET BY  MOUTH DAILY Patient taking differently: Take 600 mg by mouth at bedtime. 02/03/20  Yes [name], MD  guaiFENesin (MUCINEX) 600 MG 12 hr tablet Take 1 tablet (600 mg total) by mouth 2 (two) times daily as needed for cough or to loosen phlegm. Patient taking differently: Take 600 mg by mouth 2 (two) times daily. 05/09/20  Yes [name], MD  Lactase 9000 units TABS Take 18,000-27,000 Units by mouth 4 (four) times daily as needed (dairy consumption).   Yes [provider]  leflunomide (ARAVA) 20 MG tablet Take 20 mg by mouth daily.   Yes [provider]  magnesium oxide (MAG-OX) 400 MG tablet Take 400 mg by mouth daily.   Yes [provider]  meclizine (ANTIVERT) 25 MG tablet Take 25 mg by mouth 3 (three) times daily as needed for dizziness.   Yes [provider]  mirtazapine (REMERON SOL-TAB) 30 MG disintegrating tablet DISSOLVE 1 TABLET BY MOUTH EVERY NIGHT AT BEDTIME Patient taking differently: Take 30 mg by mouth at bedtime. 05/26/20  Yes PickardCammie Mcgee, MD  MITIGARE 0.6 MG CAPS Take 0.6 mg by mouth every other day. 01/16/20  Yes [provider]  montelukast (SINGULAIR) 10 MG tablet TAKE 1 TABLET BY MOUTH DAILY Patient taking differently: Take 10 mg by mouth daily. 09/05/20  Yes Kozlow, Donnamarie Poag, MD  mupirocin ointment (BACTROBAN) 2 % APPLY TO BED SORES AS NEEDED Patient taking differently: No sig reported 03/21/20 03/21/21 Yes [name], Cammie Mcgee, MD  Nystatin (GERHARDT'S BUTT CREAM) CREA Apply 1 application topically daily as needed (hemorrhoids). NYSTATIN 100,000 UNIT/GM CR  100000 CRM- 60 g, ZINC OXIDE 20 % OINT 20 OINT- 60 g, HYDROCORTISONE 1 % CREAM- 60 g 03/24/20  Yes [name], MD  nystatin (MYCOSTATIN) 100000 UNIT/ML suspension SWISH AND SPIT 5 MLS TWICE DAILY Patient taking differently: Use as directed 15 mLs in the mouth or throat at bedtime. 03/16/20  Yes Kozlow, Donnamarie Poag, MD  nystatin-triamcinolone ointment [facility]) Apply topically 2 (two) times daily. Patient taking differently: Apply 1 application topically 2 (two) times daily as needed (yeast infections). 07/07/19  Yes [name], MD  OVER THE COUNTER MEDICATION Take 1 tablet by mouth daily. Citracal+Magnesium 655m   Yes [provider]  predniSONE (DELTASONE) 5 MG tablet Take 15 mg by mouth daily with breakfast.   Yes [provider]  Prenatal Vit-Fe Fumarate-FA (PRENATAL MULTIVITAMIN) TABS tablet Take 1 tablet by mouth daily.   Yes [provider]  Probiotic Product (ALIGN PO) Take 1 capsule by mouth daily.   Yes [provider]  Propylene Glycol (SYSTANE COMPLETE) 0.6 % SOLN Place 1 drop into both eyes 2 (two) times daily.   Yes [provider]  tiotropium (SPIRIVA) 18 MCG inhalation capsule Place 18 mcg into inhaler and inhale daily.   Yes [provider]  Tiotropium Bromide-Olodaterol (STIOLTO RESPIMAT) 2.5-2.5 MCG/ACT AERS Inhale 2 puffs into the lungs daily. Patient taking differently: Inhale 2 puffs into the lungs at bedtime. 08/05/20  Yes DFreddi Starr MD  traMADol (ULTRAM) 50 MG tablet Take 50 mg by mouth at bedtime.   Yes [provider]  XARELTO 20 MG TABS tablet TAKE 1 TABLET BY MOUTH DAILY WITH SUPPER Patient taking differently: Take 20 mg by mouth every evening. 02/02/20  Yes PSusy Frizzle MD  Blood Glucose Monitoring Suppl (BLOOD GLUCOSE SYSTEM PAK) KIT Please dispense per patient and insurance preference. Use as directed to monitor FSBS 4x . Dx:  E11.65 09/08/20   [name], MD  Continuous Blood Gluc  Receiver (FREESTYLE LIBRE 14 DAY READER) DEVI Apply 1 each topically daily as needed. USE AS DIRECTED TO MONITOR BLOOD GLUCOSE LEVELS. DX: E11.9. 09/16/20   [name], MD  Continuous Blood Gluc Sensor (FREESTYLE LIBRE 14 DAY SENSOR) MISC Apply 1 each topically every 14 (fourteen) days. USE AS DIRECTED TO MONITOR BLOOD GLUCOSE LEVELS. DX: E11.9. 09/16/20   [name], MD  COVID-19 mRNA vaccine, Pfizer, 30 MCG/0.3ML injection INJECT AS DIRECTED 12/16/19 12/15/20  Carlyle Basques, MD  Glucose Blood (BLOOD GLUCOSE TEST STRIPS) STRP Please dispense per patient and insurance preference. Use as directed to monitor FSBS 4x . Dx: E11.65 09/08/20   [name], MD  Lancets MISC Please dispense per patient and insurance preference. Use as directed to monitor FSBS 4x . Dx: E11.65 09/08/20   [name], MD  Respiratory Therapy Supplies (FLUTTER) DEVI Use as directed 10/22/14   Elsie Stain, MD  Spacer/Aero-Holding Chambers (AEROCHAMBER PLUS) inhaler Use as instructed 07/29/18   Parrett, Fonnie Mu, NP  metolazone (ZAROXOLYN) 5 MG tablet Take 1 tablet (5 mg total) by mouth daily. Patient not taking: No sig reported 08/25/20 09/23/20  [name], MD  potassium chloride SA (KLOR-CON) 20 MEQ tablet Take 1 tablet (20 mEq total) by mouth daily. Patient not taking: Reported on 09/23/2020 08/30/20 09/23/20  [name], MD    Inpatient Medications: Scheduled Meds:  allopurinol  100 mg Oral Daily   arformoterol  15 mcg Nebulization BID   And   umeclidinium bromide  1 puff Inhalation Daily   cyproheptadine  8 mg Oral QPM   docusate sodium  100 mg Oral BID   [START ON 09/24/2020] famotidine  20 mg Oral Daily   fluconazole  200 mg Oral Daily   fluticasone  2 spray Each Nare Daily   folic acid  1 mg Oral Daily   furosemide  60 mg Intravenous Daily   gabapentin  600 mg Oral QHS   guaiFENesin  600 mg Oral BID   insulin aspart  0-15 Units Subcutaneous TID WC   insulin aspart  0-5 Units  Subcutaneous QHS   loratadine  10 mg Oral Daily   magnesium oxide  400 mg Oral Daily   mirtazapine  30 mg Oral QHS   montelukast  10 mg Oral Daily   multivitamin with minerals  1 tablet Oral Daily   pantoprazole  40 mg Oral QPM   [START ON 09/24/2020] predniSONE  15 mg Oral Q breakfast   rivaroxaban  20 mg Oral Q supper   tetrahydrozoline  1 drop Both Eyes BID   Continuous Infusions:  [START ON 09/24/2020] DAPTOmycin (CUBICIN)  IV     PRN Meds: acetaminophen, albuterol, azelastine, bisacodyl, morphine injection, ondansetron **OR** ondansetron (ZOFRAN) IV, oxyCODONE, polyethylene glycol, senna  Allergies:    Allergies  Allergen Reactions   Baclofen Other (See Comments)    Altered mental status requiring a 3-day hospital stay- patient became unresponsive   Dust Mite Extract Shortness Of Breath and Other (See Comments)    "sneezing" (02/20/2012)   Molds & Smuts Shortness Of Breath   Morphine And Related Hives and Itching   Other Shortness Of Breath and Other (See Comments)    Grass and weeds "sneezing; filled sinuses" (02/20/2012)   Penicillins Rash and Other (See Comments)    "welts" (02/20/2012) Tolerated Ancef 02/16/20.    Rofecoxib Swelling    Vioxx REACTION:  feet swelling Other reaction(s): Unknown Other reaction(s): Unknown   Shellfish Allergy Anaphylaxis, Shortness Of Breath, Itching and Rash   Shrimp Flavor Anaphylaxis and Other (See Comments)    ALL SHELLFISH, also   Tetracycline Hcl Nausea And Vomiting   Xolair [Omalizumab] Other (See Comments)    Caused Blood clot   Zoledronic Acid Other (See Comments)    Fever, Put in hospital, dr said it was a reaction from a reaction  Other reaction(s): Unknown   Dilaudid [Hydromorphone Hcl] Itching   Hydrocodone-Acetaminophen Nausea And Vomiting   Levofloxacin Other (See Comments)    GI upset Other reaction(s): Unknown   Oxycodone Hcl Nausea And Vomiting   Paroxetine Nausea And Vomiting    Paxil    Celecoxib Swelling    Feet  swelling   Diltiazem Swelling   Diltiazem Hcl     Other reaction(s): Unknown   Hydrocodone-Acetaminophen     Other reaction(s): Unknown   Lactose Intolerance (Gi) Other (See Comments)    Bloating and gas   Morphine Sulfate Other (See Comments)    "Gets loopy"   Oxycodone-Acetaminophen     Other reaction(s): Unknown   Penicillin G Procaine     Other reaction(s): Unknown   Strawberry Flavor    Tree Extract Other (See Comments)    "tested and told I was allergic to it; never experienced a reaction to it" (02/20/2012)    Social History:   Social History   Socioeconomic History   Marital status: Divorced    Spouse name: Not on file   Number of children: 2   Years of education: college   Highest education level: Not on file  Occupational History   Occupation: Disabled    Comment: Retired Engineer, production: RETIRED  Tobacco Use   Smoking status: Never    Passive exposure: Yes   Smokeless tobacco: Never   Tobacco comments:    Parents  Vaping Use   Vaping Use: Never used  Substance and Sexual Activity   Alcohol use: Not Currently    Alcohol/week: 0.0 standard drinks   Drug use: No   Sexual activity: Never  Other Topics Concern   Not on file  Social History Narrative   Patient lives at home alone. Patient  divorced.    Patient has her BS degree.   Right handed.   Caffeine- sometimes coffee.      [facility]:   Born in [place], [place]. She worked as a Copywriter, advertising. She has no pets currently. She does have indoor plants. Previously had mold in her home that was remediated. Carpet was removed.          Social Determinants of Health   Financial Resource Strain: Low Risk    Difficulty of Paying Living Expenses: Not hard at all  Food Insecurity: No Food Insecurity   Worried About Charity fundraiser in the Last Year: Never true   [place] in the Last Year: Never true  Transportation Needs: No Transportation Needs   Lack of  Transportation (Medical): No   Lack of Transportation (Non-Medical): No  Physical Activity: Inactive   Days of Exercise per Week: 0 days   Minutes of Exercise per Session: 0 min  Stress: No Stress Concern Present   Feeling of Stress : Not at all  Social Connections: Moderately Isolated   Frequency of Communication with Friends and Family: More than three times a week   Frequency of Social Gatherings with Friends  and Family: More than three times a week   Attends Religious Services: Never   Active Member of Clubs or Organizations: Yes   Attends Archivist Meetings: Never   Marital Status: Divorced  Human resources officer Violence: Not At Risk   Fear of Current or Ex-Partner: No   Emotionally Abused: No   Physically Abused: No   Sexually Abused: No    Family History:   Family History  Problem Relation Age of Onset   Allergies Mother    Heart disease Mother    Arthritis Mother    Lung cancer Mother    Diabetes Mother    Allergies Father    Heart disease Father    Arthritis Father    Stroke Father    Colon cancer Other        Maternal half aunt/Maternal half uncle   Colitis Daughter    Diabetes Maternal Grandfather      ROS:  Please see the history of present illness.  All other ROS reviewed and negative.     Physical Exam/Data:   Vitals:   09/23/20 0908 09/23/20 1113 09/23/20 1318 09/23/20 1406  BP:  (!) 154/81 (!) 157/92   Pulse:  95 97   Resp:  20 18   Temp:  97.7 F (36.5 C) 97.8 F (36.6 C)   TempSrc:  Oral Oral   SpO2: 94% 97% 98% 96%  Weight:      Height:        Intake/Output Summary (Last 24 hours) at 09/23/2020 1723 Last data filed at 09/23/2020 1519 Gross per 24 hour  Intake 1982.59 ml  Output 200 ml  Net 1782.59 ml   Last 3 Weights 09/22/2020 09/06/2020 08/30/2020  Weight (lbs) 140 lb 143 lb 144 lb  Weight (kg) 63.504 kg 64.864 kg 65.318 kg     Body mass index is 26.45 kg/m.  GENERAL:  Well appearing HEENT:  Pupils equal round and reactive,  fundi not visualized, oral mucosa unremarkable NECK:  No jugular venous distention, waveform within normal limits, carotid upstroke brisk and symmetric, no bruits, no thyromegaly LYMPHATICS:  No cervical, inguinal adenopathy LUNGS:  Clear to auscultation bilaterally BACK:  No CVA tenderness CHEST: Bilateral basilar crackles HEART:  PMI not displaced or sustained,S1 and S2 within normal limits, no S3, no S4, no clicks, no rubs, no murmurs ABD:  Flat, positive bowel sounds normal in frequency in pitch, no bruits, no rebound, no guarding, no midline pulsatile mass, no hepatomegaly, no splenomegaly EXT:  2 plus pulses throughout, diffuse mild leg edema, no cyanosis no clubbing SKIN:  No rashes no nodules NEURO:  Cranial nerves II through XII grossly intact, motor grossly intact throughout PSYCH:  Cognitively intact, oriented to person place and time   EKG:  The EKG was personally reviewed and demonstrates: Normal sinus rhythm, rate 92, axis within normal limits, QTC prolonged, left ventricular hypertrophy with repolarization changes, no change from previous other than mild QT prolongation Telemetry:  Telemetry was personally reviewed and demonstrates: Normal sinus rhythm  Relevant CV Studies:  ECHO:   1. Septal apical and anterior wall hypokinesis EF decreased compard to  TTE done 07/10/19 . Left ventricular ejection fraction, by estimation, is  35 to 40%. The left ventricle has moderately decreased function. The left  ventricle demonstrates regional  wall motion abnormalities (see scoring diagram/findings for description).  The left ventricular internal cavity size was moderately dilated. Left  ventricular diastolic parameters are consistent with Grade II diastolic  dysfunction (pseudonormalization).  Elevated left ventricular end-diastolic pressure.   2. Right ventricular systolic function is normal. The right ventricular  size is normal.   3. Left atrial size was mildly dilated.   4.  The mitral valve is abnormal. Mild mitral valve regurgitation. No  evidence of mitral stenosis.   5. The aortic valve is tricuspid. There is moderate calcification of the  aortic valve. Aortic valve regurgitation is not visualized. Mild to  moderate aortic valve sclerosis/calcification is present, without any  evidence of aortic stenosis.   6. The inferior vena cava is normal in size with greater than 50%  respiratory variability, suggesting right atrial pressure of 3 mmHg.   Laboratory Data:  High Sensitivity Troponin:  No results for input(s): TROPONINIHS in the last 720 hours.   Chemistry Recent Labs  Lab 09/22/20 1302 09/22/20 2352 09/23/20 0010  NA 138  --  136  K 3.9  --  5.3*  CL 98  --  98  CO2 27  --  25  GLUCOSE 157*  --  175*  BUN 29*  --  28*  CREATININE 1.59* 1.60* 1.59*  CALCIUM 10.0  --  9.3  GFRNONAA 34* 34* 34*  ANIONGAP 13  --  13    Recent Labs  Lab 09/22/20 1302 09/23/20 0010  PROT 6.5 6.2*  ALBUMIN 3.3* 3.0*  AST 53* 62*  ALT 72* 60*  ALKPHOS 82 75  BILITOT 1.1 1.9*   Hematology Recent Labs  Lab 09/22/20 1302 09/22/20 2352 09/23/20 0010  WBC 14.5* 16.7* 16.8*  RBC 3.53* 3.26* 3.32*  HGB 11.7* 10.9* 10.9*  HCT 37.3 35.1* 35.6*  MCV 105.7* 107.7* 107.2*  MCH 33.1 33.4 32.8  MCHC 31.4 31.1 30.6  RDW 16.7* 16.6* 16.7*  PLT 225 193 189   BNP Recent Labs  Lab 09/22/20 1426  BNP 424.3*    DDimer No results for input(s): DDIMER in the last 168 hours.   Radiology/Studies:  [facility] Chest Port 1 View  Result Date: 09/22/2020 CLINICAL DATA:  Questionable sepsis.  Evaluate for abnormality. EXAM: PORTABLE CHEST 1 VIEW COMPARISON:  Chest CT 08/23/20 FINDINGS: Electronic device is positioned over the right upper lobe which precludes evaluation of this area. Stable cardiac enlargement. No pleural effusion or edema. No airspace opacities identified. Remote left posterior rib fractures. Previous right shoulder arthroplasty. IMPRESSION: No acute  cardiopulmonary abnormalities. Electronically Signed   By: Kerby Moors M.D.   On: 09/22/2020 14:23   DG Hand Complete Right  Result Date: 09/22/2020 CLINICAL DATA:  Questionable sepsis - evaluate for abnormality EXAM: RIGHT HAND - COMPLETE 3+ VIEW COMPARISON:  Radiograph 05/25/2020 FINDINGS: There is no acute fracture or dislocation. Prior resection of the trapezium. Mild radiocarpal and intercarpal joint degenerative change. There is volar subluxation of the middle finger MCP joint and possibly the index finger MCP joint. There is moderate-severe interphalangeal joint degenerative change, worst at the proximal phalangeal joints of the third through fifth digits. There is prominent dorsal soft tissue swelling. There are soft tissue calcifications along the thumb and heterotopic ossification along the volar soft tissues adjacent to the fourth metacarpal, unchanged from prior exam. There is no new osseous erosion or destruction. IMPRESSION: Prominent dorsal soft tissue swelling. Scattered osteoarthritis with prior trapeziectomy. Volar subluxation of the third MCP joint and possibly the index finger MCP joint which may be chronic. No radiographic evidence of osteomyelitis. Electronically Signed   By: Maurine Simmering   On: 09/22/2020 14:29     Assessment and Plan:  Chronic Heart Failure with Newly Reduced EF - Patient admitted for right hand cellulitis and septic right elbow olecranon bursitis and cellulitis of right hand.However, Cardiology consulted due to recent outpatient Echo showing newly reduced EF. - Echo on 09/16/2020 showed LVEF of 35-40% with septal apical and anterior wall hypokinesis which are new. EF previously >75% in 2021. - BNP elevated at 424. - Chest x-ray showed no acute findings. - Not currently on any guideline directed medical therapy. Will start Entresto.  I also probably would start [place] this admission.  With her significant asthma I am going to avoid beta-blockers.  Currently her  potassium is mildly elevated but this is unusual.  She does have baseline renal insufficiency and we will need to watch this closely.. - Continue to monitor volume status closely.  I do think that for now Lasix 60 mg is reasonable although we will need to watch this closely.  She and I talked about salt and fluid restriction. - Patient will ultimately need an ischemic evaluation  Prior chest CT showed diffuse coronary calcifications.  I we will suggest deferring however any further evaluation until she is recovered from her acute events.  I think she could have perfusion imaging as an outpatient.  Hypertension - BP elevated with systolic BP in the 174B to 160s.  This will be treated in the context of managing heart failure - Continue medications for CHF as above.  Hyperlipidemia - Lipid panel this admission: Total Cholesterol 318, Triglycerides 475, HDL 38. LDL could not be calculated due to Triglyceride level. Direct LDL pending. Last LDL I can see in our system was 132 in 2018.  - LDL goal <70 given CAD. - On Lipitor 70m daily at home. Will go ahead and increase to 859mgiven LDL in the past and degree of coronary calcifications.  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  CKD Stage III - Creatinien 1.59 which is around patient's baseline. - Continue to monior.  PROLONGED QT  - This is noted incidentally and can be followed.  It has not been consistently prolonged.  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

## 2020-09-24 DIAGNOSIS — M71121 Other infective bursitis, right elbow: Secondary | ICD-10-CM | POA: Diagnosis not present

## 2020-09-24 DIAGNOSIS — Z86718 Personal history of other venous thrombosis and embolism: Secondary | ICD-10-CM

## 2020-09-24 LAB — CBC WITH DIFFERENTIAL/PLATELET
Abs Immature Granulocytes: 0.24 10*3/uL — ABNORMAL HIGH (ref 0.00–0.07)
Basophils Absolute: 0 10*3/uL (ref 0.0–0.1)
Basophils Relative: 0 %
Eosinophils Absolute: 0 10*3/uL (ref 0.0–0.5)
Eosinophils Relative: 0 %
HCT: 34.5 % — ABNORMAL LOW (ref 36.0–46.0)
Hemoglobin: 10.7 g/dL — ABNORMAL LOW (ref 12.0–15.0)
Immature Granulocytes: 1 %
Lymphocytes Relative: 2 %
Lymphs Abs: 0.4 10*3/uL — ABNORMAL LOW (ref 0.7–4.0)
MCH: 33 pg (ref 26.0–34.0)
MCHC: 31 g/dL (ref 30.0–36.0)
MCV: 106.5 fL — ABNORMAL HIGH (ref 80.0–100.0)
Monocytes Absolute: 1.9 10*3/uL — ABNORMAL HIGH (ref 0.1–1.0)
Monocytes Relative: 11 %
Neutro Abs: 14.7 10*3/uL — ABNORMAL HIGH (ref 1.7–7.7)
Neutrophils Relative %: 86 %
Platelets: 224 10*3/uL (ref 150–400)
RBC: 3.24 MIL/uL — ABNORMAL LOW (ref 3.87–5.11)
RDW: 16.4 % — ABNORMAL HIGH (ref 11.5–15.5)
WBC: 17.2 10*3/uL — ABNORMAL HIGH (ref 4.0–10.5)
nRBC: 0 % (ref 0.0–0.2)

## 2020-09-24 LAB — GLUCOSE, CAPILLARY
Glucose-Capillary: 222 mg/dL — ABNORMAL HIGH (ref 70–99)
Glucose-Capillary: 263 mg/dL — ABNORMAL HIGH (ref 70–99)
Glucose-Capillary: 338 mg/dL — ABNORMAL HIGH (ref 70–99)
Glucose-Capillary: 273 mg/dL — ABNORMAL HIGH (ref 70–99)

## 2020-09-24 LAB — ACID FAST SMEAR (AFB, MYCOBACTERIA)
Acid Fast Smear: NEGATIVE
Acid Fast Smear: NEGATIVE

## 2020-09-24 LAB — BASIC METABOLIC PANEL
Anion gap: 9 (ref 5–15)
BUN: 41 mg/dL — ABNORMAL HIGH (ref 8–23)
CO2: 26 mmol/L (ref 22–32)
Calcium: 8.4 mg/dL — ABNORMAL LOW (ref 8.9–10.3)
Chloride: 102 mmol/L (ref 98–111)
Creatinine, Ser: 1.47 mg/dL — ABNORMAL HIGH (ref 0.44–1.00)
GFR, Estimated: 38 mL/min — ABNORMAL LOW (ref >60)
Glucose, Bld: 223 mg/dL — ABNORMAL HIGH (ref 70–99)
Potassium: 4.2 mmol/L (ref 3.5–5.1)
Sodium: 137 mmol/L (ref 135–145)

## 2020-09-24 LAB — FUNGUS STAIN

## 2020-09-24 LAB — MAGNESIUM: Magnesium: 2.2 mg/dL (ref 1.7–2.4)

## 2020-09-24 LAB — CK: Total CK: 649 U/L — ABNORMAL HIGH (ref 38–234)

## 2020-09-24 LAB — LDL CHOLESTEROL, DIRECT: Direct LDL: 141.2 mg/dL — ABNORMAL HIGH (ref 0–99)

## 2020-09-24 MED ORDER — CARVEDILOL 3.125 MG PO TABS: 3.1250 mg | ORAL_TABLET | Freq: Two times a day (BID) | ORAL | Status: DC

## 2020-09-24 MED ORDER — SACUBITRIL-VALSARTAN 24-26 MG PO TABS
1.0000 | ORAL_TABLET | Freq: Two times a day (BID) | ORAL | Status: DC
  Administered 2020-09-24 – 2020-09-26 (×5): 1 via ORAL
  Filled 2020-09-24 (×5): qty 1

## 2020-09-24 NOTE — Progress Notes (Signed)
Dr Sabino Gasser notified by this RN in regards to yellow mews d/t tachypnea and ST. This RN and NT will be checking VS more frequently in response.

## 2020-09-24 NOTE — Progress Notes (Signed)
Progress Note  Patient Name: Ebony Scott Date of Encounter: 09/24/2020  Primary Cardiologist: Buford Dresser, MD   Subjective   Not feeling well due to breathing. No CP.  Inpatient Medications    Scheduled Meds:  allopurinol  100 mg Oral Daily   arformoterol  15 mcg Nebulization BID   And   umeclidinium bromide  1 puff Inhalation Daily   cyproheptadine  8 mg Oral QPM   docusate sodium  100 mg Oral BID   famotidine  20 mg Oral Daily   fluconazole  200 mg Oral Daily   fluticasone  2 spray Each Nare Daily   folic acid  1 mg Oral Daily   furosemide  60 mg Intravenous Daily   gabapentin  600 mg Oral QHS   guaiFENesin  600 mg Oral BID   insulin aspart  0-15 Units Subcutaneous TID WC   insulin aspart  0-5 Units Subcutaneous QHS   loratadine  10 mg Oral Daily   magnesium oxide  400 mg Oral Daily   mirtazapine  30 mg Oral QHS   montelukast  10 mg Oral Daily   multivitamin with minerals  1 tablet Oral Daily   pantoprazole  40 mg Oral QPM   predniSONE  15 mg Oral Q breakfast   rivaroxaban  20 mg Oral Q supper   sacubitril-valsartan  1 tablet Oral BID   tetrahydrozoline  1 drop Both Eyes BID   Continuous Infusions:  DAPTOmycin (CUBICIN)  IV     PRN Meds: acetaminophen, albuterol, azelastine, bisacodyl, morphine injection, ondansetron **OR** ondansetron (ZOFRAN) IV, oxyCODONE, polyethylene glycol, senna   Vital Signs    Vitals:   09/24/20 0507 09/24/20 0800 09/24/20 0831 09/24/20 1022  BP: (!) 158/90   140/77  Pulse: 97   (!) 107  Resp: 18 (!) 29  (!) 30  Temp: 98 F (36.7 C)   97.8 F (36.6 C)  TempSrc: Oral   Oral  SpO2: 90%  94% 95%  Weight:      Height:        Intake/Output Summary (Last 24 hours) at 09/24/2020 1104 Last data filed at 09/24/2020 1036 Gross per 24 hour  Intake 1203.84 ml  Output 1000 ml  Net 203.84 ml   Filed Weights   09/22/20 1211  Weight: 63.5 kg    Telemetry    ST - Personally Reviewed  ECG    No new - Personally  Reviewed  Physical Exam   GEN: No acute distress.   Neck: No JVD Cardiac: tachycardic and regular, no murmur Respiratory: coarse breath sound bilaterally, rhonchi, no significant rales GI: Soft, nontender, non-distended  MS: No edema; No deformity. Venous varicosities Neuro:  Nonfocal  Psych: Normal affect   Labs    Chemistry Recent Labs  Lab 09/22/20 1302 09/22/20 2352 09/23/20 0010 09/24/20 0618  NA 138  --  136 137  K 3.9  --  5.3* 4.2  CL 98  --  98 102  CO2 27  --  25 26  GLUCOSE 157*  --  175* 223*  BUN 29*  --  28* 41*  CREATININE 1.59* 1.60* 1.59* 1.47*  CALCIUM 10.0  --  9.3 8.4*  PROT 6.5  --  6.2*  --   ALBUMIN 3.3*  --  3.0*  --   AST 53*  --  62*  --   ALT 72*  --  60*  --   ALKPHOS 82  --  75  --  BILITOT 1.1  --  1.9*  --   GFRNONAA 34* 34* 34* 38*  ANIONGAP 13  --  13 9     Hematology Recent Labs  Lab 09/22/20 2352 09/23/20 0010 09/24/20 0618  WBC 16.7* 16.8* 17.2*  RBC 3.26* 3.32* 3.24*  HGB 10.9* 10.9* 10.7*  HCT 35.1* 35.6* 34.5*  MCV 107.7* 107.2* 106.5*  MCH 33.4 32.8 33.0  MCHC 31.1 30.6 31.0  RDW 16.6* 16.7* 16.4*  PLT 193 189 224    Cardiac EnzymesNo results for input(s): TROPONINI in the last 168 hours. No results for input(s): TROPIPOC in the last 168 hours.   BNP Recent Labs  Lab 09/22/20 1426  BNP 424.3*     DDimer No results for input(s): DDIMER in the last 168 hours.   Radiology    DG Chest Port 1 View  Result Date: 09/22/2020 CLINICAL DATA:  Questionable sepsis.  Evaluate for abnormality. EXAM: PORTABLE CHEST 1 VIEW COMPARISON:  Chest CT 08/23/20 FINDINGS: Electronic device is positioned over the right upper lobe which precludes evaluation of this area. Stable cardiac enlargement. No pleural effusion or edema. No airspace opacities identified. Remote left posterior rib fractures. Previous right shoulder arthroplasty. IMPRESSION: No acute cardiopulmonary abnormalities. Electronically Signed   By: Kerby Moors M.D.    On: 09/22/2020 14:23   DG Hand Complete Right  Result Date: 09/22/2020 CLINICAL DATA:  Questionable sepsis - evaluate for abnormality EXAM: RIGHT HAND - COMPLETE 3+ VIEW COMPARISON:  Radiograph 05/25/2020 FINDINGS: There is no acute fracture or dislocation. Prior resection of the trapezium. Mild radiocarpal and intercarpal joint degenerative change. There is volar subluxation of the middle finger MCP joint and possibly the index finger MCP joint. There is moderate-severe interphalangeal joint degenerative change, worst at the proximal phalangeal joints of the third through fifth digits. There is prominent dorsal soft tissue swelling. There are soft tissue calcifications along the thumb and heterotopic ossification along the volar soft tissues adjacent to the fourth metacarpal, unchanged from prior exam. There is no new osseous erosion or destruction. IMPRESSION: Prominent dorsal soft tissue swelling. Scattered osteoarthritis with prior trapeziectomy. Volar subluxation of the third MCP joint and possibly the index finger MCP joint which may be chronic. No radiographic evidence of osteomyelitis. Electronically Signed   By: Maurine Simmering   On: 09/22/2020 14:29    Cardiac Studies   No inpatient studies  Patient Profile     70 y.o. 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 Sabino Gasser.  Assessment & Plan   Principal Problem:   Septic olecranon bursitis of right elbow Active Problems:   Hyperlipidemia   GERD   Chronic combined systolic and diastolic CHF (congestive heart failure) (HCC)   Chronic kidney disease, stage 3b (HCC)   Pulmonary nodules   Hypothyroidism   Rheumatoid  arthritis (HCC)   Tracheobronchomalacia   Cellulitis of right hand   Abscess of dorsum of right hand   Laryngeal candidiasis   Osteomyelitis of second toe of right foot (HCC)   DMII (diabetes mellitus, type 2) (HCC)    Chronic Heart Failure with Newly Reduced EF - Patient admitted for right hand cellulitis and septic right elbow olecranon bursitis and cellulitis of right hand.However,  Cardiology consulted due to recent outpatient Echo showing newly reduced EF. - Echo on 09/16/2020 showed LVEF of 35-40% with septal apical and anterior wall hypokinesis which are new. EF previously >75% in 2021. - BNP elevated at 424. - Chest x-ray showed no acute findings. - Not currently on any guideline directed medical therapy.  - Starting [facility] today.  - Consider adding Farxiga prior to discharge. - will avoid BB at this time given respiratory issues. Most recent PFTs last year show low FEV1 but elevated FEV1/FVC, and suggest interstitial process.  - with renal insufficiency, will review prior to starting spironolactone.  -Dense coronary calcifications on recent chest CT. Would not perform CCTA for ischemic eval. With respiratory status, may not do well with regadenason stress either. Treadmill not an option. I think proceeding directly to cath when stable is likely the best way to evaluate for ischemia. Will address when stable from infection standpoint. Likely would benefit from Oasis as well.  - continue lasix 60 mg IV today.    Hypertension - BP elevated with systolic BP in the Q000111Q to 160s.  This will be treated in the context of managing heart failure - Continue medications for CHF as above.   Hyperlipidemia - Lipid panel this admission: Total Cholesterol 318, Triglycerides 475, HDL 38. LDL could not be calculated due to Triglyceride level. Direct LDL pending. Last LDL I can see in our system was 132 in 2018. - LDL goal <70 given CAD. - On Lipitor '80mg'$  given LDL in the past and degree of coronary  calcifications.   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   CKD Stage III - Creatinien 1.47, slight improvement   PROLONGED QT  - This is noted incidentally and can be followed.  It has not been consistently prolonged.   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

## 2020-09-24 NOTE — Progress Notes (Signed)
Occupational Therapy Treatment Patient Details Name: Ebony Scott MRN: [id number] DOB: 07/30/1950 Today's Date: 09/24/2020    History of present illness Ebony Scott is a 70 y.o. female with medical history significant of ashtma, combined systolic/diastolic HF, tracheobronchomalacia. Presenting with right hand swelling and pain. She has had a right hand ganglion cyst for the last 3 months. Per her report, she had had multiple I&Ds during this time; with her last being about 3 weeks ago. In the last 3 weeks, she had noticed increased swelling and erythema of that hand and arm. Patient s/p Excisional debridement, right olecranon bursa with olecranon bursectomy and irrigation and debridement and excisional debridement, right dorsal rheumatoid nodule, overlying the long metacarpal phalangeal joint   OT comments  Patient continues to be limited by feelings of dyspnea and fatigue. Also patient NWB with RUE significantly effecting her mobility as she is typically a walker user. Patient continues to need max assist for toileting and min assist for steadying with transfers. Has not been able to ambulate yet due to respiratory status and not being able to use a walker. Patient may need short term rehab if she continues to be NWB on RUE as she is very unsteady and a high fall risk. Will continue to follow acutely.   Follow Up Recommendations  Home health OT;SNF    Equipment Recommendations  None recommended by OT    Recommendations for Other Services      Precautions / Restrictions Precautions Precautions: Fall Required Braces or Orthoses: Splint/Cast Splint/Cast: long posterior splint RUE Restrictions Weight Bearing Restrictions: Yes RUE Weight Bearing: Non weight bearing       Mobility Bed Mobility               General bed mobility comments: oob    Transfers Overall transfer level: Needs assistance Equipment used: 1 person hand held assist Transfers: Sit to/from Stand;Stand Pivot  Transfers Sit to Stand: Min assist Stand pivot transfers: Min assist            Balance Overall balance assessment: Needs assistance Sitting-balance support: No upper extremity supported Sitting balance-Leahy Scale: Fair     Standing balance support: Single extremity supported Standing balance-Leahy Scale: Poor                             ADL either performed or assessed with clinical judgement   ADL Overall ADL's : Needs assistance/impaired                         Toilet Transfer: BSC;Stand-pivot;Minimal assistance   Toileting- Clothing Manipulation and Hygiene: Maximal assistance;Sit to/from stand Toileting - Clothing Manipulation Details (indicate cue type and reason): Patient found on BSC. Continues to need max assist due to being unsteady and needing to stabilize herself with left hand and NWB on RUE.     Functional mobility during ADLs: Minimal assistance       Vision Patient Visual Report: No change from baseline     Perception     Praxis      Cognition Arousal/Alertness: Awake/alert Behavior During Therapy: WFL for tasks assessed/performed Overall Cognitive Status: Within Functional Limits for tasks assessed                                          Exercises  Shoulder Instructions       General Comments      Pertinent Vitals/ Pain       Pain Assessment: Faces Pain Score: 0-No pain  Home Living                                          Prior Functioning/Environment              Frequency  Min 2X/week        Progress Toward Goals  OT Goals(current goals can now be found in the care plan section)  Progress towards OT goals: Progressing toward goals  Acute Rehab OT Goals Patient Stated Goal: to be able to walk to bathroom OT Goal Formulation: With patient Time For Goal Achievement: 10/07/20 Potential to Achieve Goals: Good  Plan Discharge plan needs to be updated     Co-evaluation                 AM-PAC OT "6 Clicks" Daily Activity     Outcome Measure   Help from another person eating meals?: A Little Help from another person taking care of personal grooming?: A Little Help from another person toileting, which includes using toliet, bedpan, or urinal?: A Lot Help from another person bathing (including washing, rinsing, drying)?: A Lot Help from another person to put on and taking off regular upper body clothing?: A Lot Help from another person to put on and taking off regular lower body clothing?: A Lot 6 Click Score: 14    End of Session Equipment Utilized During Treatment: Oxygen  OT Visit Diagnosis: Unsteadiness on feet (R26.81);Pain   Activity Tolerance Patient limited by fatigue   Patient Left in chair;with call bell/phone within reach   Nurse Communication Mobility status        Time: 1430-1447 OT Time Calculation (min): 17 min  Charges: OT General Charges $OT Visit: 1 Visit OT Treatments $Self Care/Home Management : 8-22 mins  Derl Barrow, OTR/L [place]  Office [phone] Pager: [place] 09/24/2020, 3:38 PM

## 2020-09-24 NOTE — Progress Notes (Signed)
Progress Note    Ebony Scott   JXB:[id number]  DOB: [birth year]  DOA: 09/22/2020     2  PCP: [name], MD  CC: hand pain  Hospital Course: Ms. Sassano is a 70 yo female with PMH DVT, anxiety/depression, RA, breast cancer, cdiff, diverticulosis, fibromyalgia, GERD, colonic polyps, HLD, DMII, CKD3b, hypothyroidism, IBS, moderate persistent asthma, osteoporosis, PVD, tracheobronchomalacia who presented with right hand pain and swelling.  She has been managed outpatient recently by ortho due to this; she has undergone previous aspirations and been seen by ID in past.  She was admitted for ortho evaluation for further debridement of her right hand and right elbow, which was performed on 09/22/20. ID will again follow inpatient for assistance with her complex recurrent infections.  Cardiology was also consulted after her recent outpatient echo showed reduced EF and need for further workup and treatment.   Interval History:  No events overnight.  Had some pain overnight but states that she tolerated the oxycodone which finally helped the pain.  Encouraged her that she needs to review her allergy list with a pharmacist to help decrease some possible unneeded allergies/intolerances.  ROS: Constitutional: negative for chills and fevers, Respiratory: negative for cough and sputum, Cardiovascular: negative for chest pain, and Gastrointestinal: negative for abdominal pain  Assessment & Plan: * Septic olecranon bursitis of right elbow - has been followed by ID in the past; last seen in June 2022; has been on several courses of fluconazole for various albicans infections (see below as well) - underwent excisional debridement on 8/4 with ortho - ID following; see abscess hand too  Abscess of dorsum of right hand - s/p excisional debridement of dorsal rheumatoid nodule and I&D of 3rd MCP - follow up pending cultures: rare yeast growing in hand and synovial cx - ID following: continue daptomycin,  fluconazole - dressing changes as per ortho (elbow changed on 8/6)  Cellulitis of right hand - see hand abscess   Chronic combined systolic and diastolic CHF (congestive heart failure) ([place]) - recent echo on 09/16/20 shows EF now reduced compared to prior echo 07/09/20 - EF now 35-40%, WMA; Gr 2 DD - patient was attempted to be contacted by PCP within the last few days but unsuccessful; she was going to be started on Farxiga and ARB with a cardiology referral - Entresto started on 8/5 per cardiology; Wilder Glade recommended however her CrCl is just below 30 so contraindicated at this time; will monitor renal function for any improvement and/or ability to start - tentative plan is left/right heart cath for ischemic evaluation (not considered a good candidate for other modalities)  History of DVT (deep vein thrombosis) - on lifelong xarelto   Osteomyelitis of second toe of right foot ([place]) - s/p right 2nd digit internal amputation on 6/3 - following with podiatry; recently seen 09/09/20; toe healing as expected - WBAT in surgical shoe - continue betadine wet to dry dressing changes   Rheumatoid arthritis ([place]) - treated with rituxan and prednisone 15 mg daily   Hyperlipidemia - no recent lipid panel; TC 318, TG 475, HDL 38, unable to calc LDL - was on lipitor 10 mg daily at home; no noted intolerance on very long allergy profile - agree with cardiology increase of lipitor to 80 mg daily; this will have to wait until daptomycin course is complete due to interaction and will resume upon completion  DMII (diabetes mellitus, type 2) ([place]) - A1c 8.4% on 09/22/20 - recently to be started  on Farxiga outpatient to aid in CHF treatment as well - continue SSI and CBG monitoring   Laryngeal candidiasis - seen by ENT and treated with diflucan on 04/2020 and 05/2020 for laryngeal candidiasis -Recent evaluation by ENT on 09/09/2020.  No evidence of candidiasis or fungal infection on laryngoscopy; she was  recommended to continue Pepcid every morning and Dexilant before 5 PM - Etiology is considered presumed to ongoing immunosuppression, inhalers, and possibly her multiple recurrent infections  Tracheobronchomalacia - followed by pulm - continue stiolto   Hypothyroidism - ?hx hyperthryoidism, likely treated - no synthroid on med rec - check TSH = normal, 0.983  Pulmonary nodules - followed closely by pulmonology outpatient - s/p BAL on 04/05/20; grew C. Albicans at that time - CT on 4/6 showed improved/decrease in size of nodules compared to 2/4 scan  Chronic kidney disease, stage 3b ([place]) - patient has history of CKD3b. Baseline creat ~ 1.2 - 1.4, eGFR 32-41 - creat 1.59 on admission; not far off baseline - continue trending BMP daily    GERD - Noted on laryngoscopy with ENT outpatient as well.  Recent evaluation on 09/09/2020 - Has been recommended to be on Pepcid every morning and Dexilant in the late afternoon   Old records reviewed in assessment of this patient  Antimicrobials: Vanc x 1 Ancef x 1 CTX x 1 Fluconazole 09/22/20 > current  Daptomycin 8/6 >> current   DVT prophylaxis: SCD's Start: 09/22/20 2312 rivaroxaban (XARELTO) tablet 20 mg   Code Status:   Code Status: DNR Family Communication:   Disposition Plan: Status is: Inpatient  Remains inpatient appropriate because:IV treatments appropriate due to intensity of illness or inability to take PO and Inpatient level of care appropriate due to severity of illness  Dispo: The patient is from: Home              Anticipated d/c is to: Home              Patient currently is not medically stable to d/c.   Difficult to place patient No Risk of unplanned readmission score: Unplanned Admission- Pilot do not use: 29.59   Objective: Blood pressure 140/77, pulse (!) 107, temperature 97.8 F (36.6 C), temperature source Oral, resp. rate (!) 30, height _0  (1.549 m), weight 63.5 kg, SpO2 95 %.  Examination: General  appearance: alert, cooperative, and no distress Head: Normocephalic, without obvious abnormality, atraumatic Eyes:  EOMI Lungs: clear to auscultation bilaterally Heart: regular rate and rhythm and S1, S2 normal Abdomen:  soft, obese, NT, ND, BS present Extremities:  RUE wrapped in dressing from fingers to above elbow; no obvious swelling in RUE. Right foot wrapped in dressing with betadine noted on dressings and no obvious swelling or pain in RLE Skin: mobility and turgor normal Neurologic: no paresthesias; no obvious focal deficits  Consultants:  Orthopedic surgery ID Cardiology   Procedures:  Excisional debridement RUE, 09/22/20  Data Reviewed: I have personally reviewed following labs and imaging studies Results for orders placed or performed during the hospital encounter of 09/22/20 (from the past 24 hour(s))  Glucose, capillary     Status: Abnormal   Collection Time: 09/23/20  4:53 PM  Result Value Ref Range   Glucose-Capillary 324 (H) 70 - 99 mg/dL  Glucose, capillary     Status: Abnormal   Collection Time: 09/23/20 10:40 PM  Result Value Ref Range   Glucose-Capillary 257 (H) 70 - 99 mg/dL  CK     Status: Abnormal  Collection Time: 09/24/20  6:18 AM  Result Value Ref Range   Total CK 649 (H) 38 - 234 U/L  Basic metabolic panel     Status: Abnormal   Collection Time: 09/24/20  6:18 AM  Result Value Ref Range   Sodium 137 135 - 145 mmol/L   Potassium 4.2 3.5 - 5.1 mmol/L   Chloride 102 98 - 111 mmol/L   CO2 26 22 - 32 mmol/L   Glucose, Bld 223 (H) 70 - 99 mg/dL   BUN 41 (H) 8 - 23 mg/dL   Creatinine, Ser 1.47 (H) 0.44 - 1.00 mg/dL   Calcium 8.4 (L) 8.9 - 10.3 mg/dL   GFR, Estimated 38 (L) >60 mL/min   Anion gap 9 5 - 15  CBC with Differential/Platelet     Status: Abnormal   Collection Time: 09/24/20  6:18 AM  Result Value Ref Range   WBC 17.2 (H) 4.0 - 10.5 K/uL   RBC 3.24 (L) 3.87 - 5.11 MIL/uL   Hemoglobin 10.7 (L) 12.0 - 15.0 g/dL   HCT 34.5 (L) 36.0 - 46.0 %    MCV 106.5 (H) 80.0 - 100.0 fL   MCH 33.0 26.0 - 34.0 pg   MCHC 31.0 30.0 - 36.0 g/dL   RDW 16.4 (H) 11.5 - 15.5 %   Platelets 224 150 - 400 K/uL   nRBC 0.0 0.0 - 0.2 %   Neutrophils Relative % 86 %   Neutro Abs 14.7 (H) 1.7 - 7.7 K/uL   Lymphocytes Relative 2 %   Lymphs Abs 0.4 (L) 0.7 - 4.0 K/uL   Monocytes Relative 11 %   Monocytes Absolute 1.9 (H) 0.1 - 1.0 K/uL   Eosinophils Relative 0 %   Eosinophils Absolute 0.0 0.0 - 0.5 K/uL   Basophils Relative 0 %   Basophils Absolute 0.0 0.0 - 0.1 K/uL   Immature Granulocytes 1 %   Abs Immature Granulocytes 0.24 (H) 0.00 - 0.07 K/uL  Magnesium     Status: None   Collection Time: 09/24/20  6:18 AM  Result Value Ref Range   Magnesium 2.2 1.7 - 2.4 mg/dL  Glucose, capillary     Status: Abnormal   Collection Time: 09/24/20  7:49 AM  Result Value Ref Range   Glucose-Capillary 222 (H) 70 - 99 mg/dL  Glucose, capillary     Status: Abnormal   Collection Time: 09/24/20 12:22 PM  Result Value Ref Range   Glucose-Capillary 263 (H) 70 - 99 mg/dL   *Note: Due to a large number of results and/or encounters for the requested time period, some results have not been displayed. A complete set of results can be found in Results Review.    Recent Results (from the past 240 hour(s))  Blood Culture (routine x 2)     Status: None (Preliminary result)   Collection Time: 09/22/20  1:03 PM   Specimen: BLOOD  Result Value Ref Range Status   Specimen Description   Final    BLOOD RIGHT ANTECUBITAL Performed at [facility] [address], [place], [place] [address]    Special Requests   Final    BOTTLES DRAWN AEROBIC AND ANAEROBIC Blood Culture adequate volume Performed at [facility] [address], [place], [place] [address]    Culture   Final    NO GROWTH 2 DAYS Performed at [facility] [address] [place], [place] [address]    Report Status PENDING  Incomplete  Resp Panel by  RT-PCR (Flu A&B,  Covid) Nasopharyngeal Swab     Status: None   Collection Time: 09/22/20  1:04 PM   Specimen: Nasopharyngeal Swab; Nasopharyngeal(NP) swabs in vial transport medium  Result Value Ref Range Status   SARS Coronavirus 2 by RT PCR NEGATIVE NEGATIVE Final    Comment: (NOTE) SARS-CoV-2 target nucleic acids are NOT DETECTED.  The SARS-CoV-2 RNA is generally detectable in upper respiratory specimens during the acute phase of infection. The lowest concentration of SARS-CoV-2 viral copies this assay can detect is 138 copies/mL. A negative result does not preclude SARS-Cov-2 infection and should not be used as the sole basis for treatment or other patient management decisions. A negative result may occur with  improper specimen collection/handling, submission of specimen other than nasopharyngeal swab, presence of viral mutation(s) within the areas targeted by this assay, and inadequate number of viral copies(<138 copies/mL). A negative result must be combined with clinical observations, patient history, and epidemiological information. The expected result is Negative.  Fact Sheet for Patients:  EntrepreneurPulse.com.au  Fact Sheet for Healthcare Providers:  IncredibleEmployment.be  This test is no t yet approved or cleared by the [place] FDA and  has been authorized for detection and/or diagnosis of SARS-CoV-2 by FDA under an Emergency Use Authorization (EUA). This EUA will remain  in effect (meaning this test can be used) for the duration of the COVID-19 declaration under Section 564(b)(1) of the Act, 21 U.S.C.section 360bbb-3(b)(1), unless the authorization is terminated  or revoked sooner.       Influenza A by PCR NEGATIVE NEGATIVE Final   Influenza B by PCR NEGATIVE NEGATIVE Final    Comment: (NOTE) The Xpert Xpress SARS-CoV-2/FLU/RSV plus assay is intended as an aid in the diagnosis of influenza from Nasopharyngeal swab specimens and should  not be used as a sole basis for treatment. Nasal washings and aspirates are unacceptable for Xpert Xpress SARS-CoV-2/FLU/RSV testing.  Fact Sheet for Patients: EntrepreneurPulse.com.au  Fact Sheet for Healthcare Providers: IncredibleEmployment.be  This test is not yet approved or cleared by the [place] FDA and has been authorized for detection and/or diagnosis of SARS-CoV-2 by FDA under an Emergency Use Authorization (EUA). This EUA will remain in effect (meaning this test can be used) for the duration of the COVID-19 declaration under Section 564(b)(1) of the Act, 21 U.S.C. section 360bbb-3(b)(1), unless the authorization is terminated or revoked.  Performed at [facility], [place] [address] [place], [place] [address]   Blood Culture (routine x 2)     Status: None (Preliminary result)   Collection Time: 09/22/20  1:08 PM   Specimen: BLOOD  Result Value Ref Range Status   Specimen Description   Final    BLOOD LEFT ANTECUBITAL Performed at [facility] [address], [place], [place] [address]    Special Requests   Final    BOTTLES DRAWN AEROBIC AND ANAEROBIC Blood Culture adequate volume Performed at [facility] [address], [place], [place] [address]    Culture   Final    NO GROWTH 2 DAYS Performed at [facility] [address] [place], [place] [address]    Report Status PENDING  Incomplete  Aerobic/Anaerobic Culture w Gram Stain (surgical/deep wound)     Status: None (Preliminary result)   Collection Time: 09/22/20 10:05 PM   Specimen: Synovial, Right Elbow; Tissue  Result Value Ref Range Status   Specimen Description   Final    SYNOVIAL Performed at [facility], [place] [address] [place], [name] [address]    Special Requests   Final    NONE Performed at [facility], [place] [address] [place], [place] [address]     Gram Stain   Final    RARE WBC PRESENT,BOTH PMN AND MONONUCLEAR NO ORGANISMS SEEN    Culture   Final    RARE YEAST IDENTIFICATION TO FOLLOW Performed at [facility], [place] [address] [place], [place] [address]    Report Status PENDING  Incomplete  Fungus Stain     Status: None   Collection Time: 09/22/20 10:05 PM   Specimen: Synovial, Right Elbow; Tissue  Result Value Ref Range Status   FUNGUS STAIN DUPLICATE  Final    Comment: (NOTE) Duplicate procedure ordered.      Refer to [phone] for results. Performed At: [facility] [place], [place] [id number] Rush Farmer MD [id number]    Fungal Source SYNOVIAL  Final    Comment: Performed at [facility], [place] [address], [place], [place] [address]  Acid Fast Smear (AFB)     Status: None   Collection Time: 09/22/20 10:05 PM   Specimen: Synovial, Right Elbow; Tissue  Result Value Ref Range Status   AFB Specimen Processing Comment  Final    Comment: Tissue Grinding and Digestion/Decontamination   Acid Fast Smear Negative  Final    Comment: (NOTE) Performed At: [facility] [address], [place] [id number] Rush Farmer MD [id number]    Source (AFB) HAND  Final    Comment: Performed at [facility], [place] [address] [place], [place] [address]  Aerobic/Anaerobic Culture w Gram Stain (surgical/deep wound)     Status: None (Preliminary result)   Collection Time: 09/22/20 10:05 PM   Specimen: Hand, Right; Body Fluid  Result Value Ref Range Status   Specimen Description   Final    HAND Performed at [facility], [place] [address], [place], [place] [address]    Special Requests   Final    NONE Performed at [facility], [place] [address] [place], [place] [address]    Gram Stain   Final    RARE WBC PRESENT, PREDOMINANTLY PMN NO ORGANISMS SEEN    Culture   Final    RARE YEAST CULTURE REINCUBATED FOR  BETTER GROWTH Performed at [facility], [place] [address] [place], [place] [address]    Report Status PENDING  Incomplete  Acid Fast Smear (AFB)     Status: None   Collection Time: 09/22/20 10:05 PM   Specimen: Hand, Right; Body Fluid  Result Value Ref Range Status   AFB Specimen Processing Comment  Final    Comment: Tissue Grinding and Digestion/Decontamination   Acid Fast Smear Negative  Final    Comment: (NOTE) Performed At: [facility] [place], [place] [id number] Rush Farmer MD [id number]    Source (AFB) SYNOVIAL  Final    Comment: Performed at [facility], [place] [address], [place], [place] [address]     Radiology Studies: No results found. DG Chest Port 1 View  Final Result    DG Hand Complete Right  Final Result      Scheduled Meds:  allopurinol  100 mg Oral Daily   arformoterol  15 mcg Nebulization BID   And   umeclidinium bromide  1 puff Inhalation Daily   cyproheptadine  8 mg Oral QPM   docusate  sodium  100 mg Oral BID   famotidine  20 mg Oral Daily   fluconazole  200 mg Oral Daily   fluticasone  2 spray Each Nare Daily   folic acid  1 mg Oral Daily   furosemide  60 mg Intravenous Daily   gabapentin  600 mg Oral QHS   guaiFENesin  600 mg Oral BID   insulin aspart  0-15 Units Subcutaneous TID WC   insulin aspart  0-5 Units Subcutaneous QHS   loratadine  10 mg Oral Daily   magnesium oxide  400 mg Oral Daily   mirtazapine  30 mg Oral QHS   montelukast  10 mg Oral Daily   multivitamin with minerals  1 tablet Oral Daily   pantoprazole  40 mg Oral QPM   predniSONE  15 mg Oral Q breakfast   rivaroxaban  20 mg Oral Q supper   sacubitril-valsartan  1 tablet Oral BID   tetrahydrozoline  1 drop Both Eyes BID   PRN Meds: acetaminophen, albuterol, azelastine, bisacodyl, morphine injection, ondansetron **OR** ondansetron (ZOFRAN) IV, oxyCODONE, polyethylene glycol, senna Continuous Infusions:  DAPTOmycin  (CUBICIN)  IV       LOS: 2 days  Time spent: Greater than 50% of the 35 minute visit was spent in counseling/coordination of care for the patient as laid out in the A&P.   Dwyane Dee, MD [facility] 09/24/2020, 2:22 PM

## 2020-09-24 NOTE — Progress Notes (Signed)
Pt refused cpap for tonight 

## 2020-09-24 NOTE — Assessment & Plan Note (Signed)
-   on lifelong xarelto

## 2020-09-24 NOTE — Progress Notes (Signed)
SPORTS MEDICINE AND JOINT REPLACEMENT  Lara Mulch, MD    Carlyon Shadow, PA-C [place], [place], [place]  [address]                             [phone]   PROGRESS NOTE  Subjective:  negative for Chest Pain  negative for Shortness of Breath  negative for Nausea/Vomiting   negative for Calf Pain  negative for Bowel Movement   Tolerating Diet: yes         Patient reports pain as 3 on 0-10 scale.    Objective: Vital signs in last 24 hours:   Patient Vitals for the past 24 hrs:  BP Temp Temp src Pulse Resp SpO2  09/24/20 0507 (!) 158/90 98 F (36.7 C) Oral 97 18 90 %  09/23/20 2035 132/75 98.2 F (36.8 C) Oral 89 20 95 %  09/23/20 1940 -- -- -- -- -- 94 %  09/23/20 1406 -- -- -- -- -- 96 %  09/23/20 1318 (!) 157/92 97.8 F (36.6 C) Oral 97 18 98 %  09/23/20 1113 (!) 154/81 97.7 F (36.5 C) Oral 95 20 97 %  09/23/20 0908 -- -- -- -- -- 94 %    '@flow'$ {1959:LAST@   Intake/Output from previous day:   08/05 0701 - 08/06 0700 In: 963.8 [P.O.:480; I.V.:483.8] Out: 1000 [Urine:1000]   Intake/Output this shift:   No intake/output data recorded.   Intake/Output      08/05 0701 08/06 0700 08/06 0701 08/07 0700   P.O. 480    I.V. (mL/kg) 483.8 (7.6)    IV Piggyback     Total Intake(mL/kg) 963.8 (15.2)    Urine (mL/kg/hr) 1000 (0.7)    Blood     Total Output 1000    Net -36.2            LABORATORY DATA: Recent Labs    09/22/20 1302 09/22/20 2352 09/23/20 0010 09/24/20 0618  WBC 14.5* 16.7* 16.8* 17.2*  HGB 11.7* 10.9* 10.9* 10.7*  HCT 37.3 35.1* 35.6* 34.5*  PLT 225 193 189 224   Recent Labs    09/22/20 1302 09/22/20 2352 09/23/20 0010 09/24/20 0618  NA 138  --  136 137  K 3.9  --  5.3* 4.2  CL 98  --  98 102  CO2 27  --  25 26  BUN 29*  --  28* 41*  CREATININE 1.59* 1.60* 1.59* 1.47*  GLUCOSE 157*  --  175* 223*  CALCIUM 10.0  --  9.3 8.4*   Lab Results  Component Value Date   INR 1.0 09/22/2020   INR 0.9 04/29/2020   INR 2.7  (H) 02/23/2020    Examination:  General appearance: alert, cooperative, and no distress Extremities: extremities normal, atraumatic, no cyanosis or edema  Wound Exam: clean, dry, intact   Drainage:  None: wound tissue dry  Motor Exam: Opposition, Pinch, and Wrist Dorsiflexion Intact  Sensory Exam: Radial, Ulnar, and Median normal   Assessment:    2 Days Post-Op  Procedure(s) (LRB): IRRIGATION AND DEBRIDEMENT RIGHT HAND AND ELBOW (Right)  ADDITIONAL DIAGNOSIS:  Principal Problem:   Septic olecranon bursitis of right elbow Active Problems:   Hyperlipidemia   GERD   Chronic combined systolic and diastolic CHF (congestive heart failure) ([facility])   Chronic kidney disease, stage 3b ([facility])   Pulmonary nodules   Hypothyroidism   Rheumatoid arthritis ([facility])   Tracheobronchomalacia   Cellulitis of  right hand   Abscess of dorsum of right hand   Laryngeal candidiasis   Osteomyelitis of second toe of right foot ([facility])   DMII (diabetes mellitus, type 2) ([place])     Plan: Physical Therapy as ordered Non Weight Bearing (NWB)  DVT Prophylaxis:  Xarelto  Right elbow dressing change, splint reapplied. Pain under control. Cultures pending. ID following   Donia Ast 09/24/2020, 8:02 AM

## 2020-09-25 DIAGNOSIS — R5381 Other malaise: Secondary | ICD-10-CM

## 2020-09-25 DIAGNOSIS — M71121 Other infective bursitis, right elbow: Secondary | ICD-10-CM | POA: Diagnosis not present

## 2020-09-25 LAB — CBC WITH DIFFERENTIAL/PLATELET
Abs Immature Granulocytes: 0.25 10*3/uL — ABNORMAL HIGH (ref 0.00–0.07)
Basophils Absolute: 0 10*3/uL (ref 0.0–0.1)
Basophils Relative: 0 %
Eosinophils Absolute: 0 10*3/uL (ref 0.0–0.5)
Eosinophils Relative: 0 %
HCT: 36.1 % (ref 36.0–46.0)
Hemoglobin: 11.3 g/dL — ABNORMAL LOW (ref 12.0–15.0)
Immature Granulocytes: 1 %
Lymphocytes Relative: 4 %
Lymphs Abs: 0.8 10*3/uL (ref 0.7–4.0)
MCH: 33.1 pg (ref 26.0–34.0)
MCHC: 31.3 g/dL (ref 30.0–36.0)
MCV: 105.9 fL — ABNORMAL HIGH (ref 80.0–100.0)
Monocytes Absolute: 2.5 10*3/uL — ABNORMAL HIGH (ref 0.1–1.0)
Monocytes Relative: 13 %
Neutro Abs: 15 10*3/uL — ABNORMAL HIGH (ref 1.7–7.7)
Neutrophils Relative %: 82 %
Platelets: 246 10*3/uL (ref 150–400)
RBC: 3.41 MIL/uL — ABNORMAL LOW (ref 3.87–5.11)
RDW: 16.4 % — ABNORMAL HIGH (ref 11.5–15.5)
WBC: 18.6 10*3/uL — ABNORMAL HIGH (ref 4.0–10.5)
nRBC: 0 % (ref 0.0–0.2)

## 2020-09-25 LAB — BASIC METABOLIC PANEL
Anion gap: 8 (ref 5–15)
BUN: 42 mg/dL — ABNORMAL HIGH (ref 8–23)
CO2: 26 mmol/L (ref 22–32)
Calcium: 8.5 mg/dL — ABNORMAL LOW (ref 8.9–10.3)
Chloride: 105 mmol/L (ref 98–111)
Creatinine, Ser: 1.31 mg/dL — ABNORMAL HIGH (ref 0.44–1.00)
GFR, Estimated: 43 mL/min — ABNORMAL LOW (ref >60)
Glucose, Bld: 170 mg/dL — ABNORMAL HIGH (ref 70–99)
Potassium: 4 mmol/L (ref 3.5–5.1)
Sodium: 139 mmol/L (ref 135–145)

## 2020-09-25 LAB — GLUCOSE, CAPILLARY
Glucose-Capillary: 165 mg/dL — ABNORMAL HIGH (ref 70–99)
Glucose-Capillary: 291 mg/dL — ABNORMAL HIGH (ref 70–99)
Glucose-Capillary: 185 mg/dL — ABNORMAL HIGH (ref 70–99)
Glucose-Capillary: 230 mg/dL — ABNORMAL HIGH (ref 70–99)

## 2020-09-25 LAB — MAGNESIUM: Magnesium: 2.2 mg/dL (ref 1.7–2.4)

## 2020-09-25 MED ORDER — DAPAGLIFLOZIN PROPANEDIOL 10 MG PO TABS
10.0000 mg | ORAL_TABLET | Freq: Every day | ORAL | Status: DC
  Administered 2020-09-25 – 2020-09-26 (×2): 10 mg via ORAL
  Filled 2020-09-25 (×2): qty 1

## 2020-09-25 NOTE — Progress Notes (Signed)
Pt said she does not like the cpap and refused for the night.

## 2020-09-25 NOTE — Progress Notes (Signed)
Occupational Therapy Treatment Patient Details Name: Ebony Scott MRN: [id number] DOB: 05-13-1950 Today's Date: 09/25/2020    History of present illness Ebony Scott is a 70 y.o. female with medical history significant of ashtma, combined systolic/diastolic HF, tracheobronchomalacia. Presenting with right hand swelling and pain. She has had a right hand ganglion cyst for the last 3 months. Per her report, she had had multiple I&Ds during this time; with her last being about 3 weeks ago. In the last 3 weeks, she had noticed increased swelling and erythema of that hand and arm. Patient s/p Excisional debridement, right olecranon bursa with olecranon bursectomy and irrigation and debridement and excisional debridement, right dorsal rheumatoid nodule, overlying the long metacarpal phalangeal joint   OT comments  Treatment focused on ambulating with walker now that patient has WBAT order for RUE and perform toileting. Patient still needing some assistance with clothing management but overall improved. Patient wanting to go home at discharge but knows she will be alone at times. She is still a high fall risk. Will continue to assess while in acute.    Follow Up Recommendations  Home health OT;SNF    Equipment Recommendations  None recommended by OT    Recommendations for Other Services      Precautions / Restrictions Precautions Precautions: Fall Required Braces or Orthoses: Splint/Cast Splint/Cast: long posterior splint RUE Restrictions Weight Bearing Restrictions: Yes RUE Weight Bearing: Weight bearing as tolerated       Mobility Bed Mobility               General bed mobility comments: oob    Transfers Overall transfer level: Needs assistance Equipment used: Rolling walker (2 wheeled) Transfers: Sit to/from Stand Sit to Stand: Min assist Stand pivot transfers: Min guard       General transfer comment: Initially min assist with steadying when attempting to ambulate with  post up shoe. Doffed due to not having shoe for left foot and making patient unstable. Pat[place] able to ambulate in room twice with walker with min guard. Has some difficulty managing walker due to splinting material but functional.    Balance   Sitting-balance support: No upper extremity supported Sitting balance-Leahy Scale: Fair     Standing balance support: During functional activity Standing balance-Leahy Scale: Fair                             ADL either performed or assessed with clinical judgement   ADL Overall ADL's : Needs assistance/impaired                         Toilet Transfer: Min guard;RW;BSC   Toileting- Clothing Manipulation and Hygiene: Sit to/from stand;Moderate assistance;Set up [place] Details (indicate cue type and reason): Patient needed some assistance for clothing management but able to wipe self with left hand.     Functional mobility during ADLs: Min guard;Rolling walker       Vision Patient Visual Report: No change from baseline Vision Assessment?: No apparent visual deficits   Perception     Praxis      Cognition Arousal/Alertness: Awake/alert Behavior During Therapy: WFL for tasks assessed/performed Overall Cognitive Status: Within Functional Limits for tasks assessed  Exercises     Shoulder Instructions       General Comments      Pertinent Vitals/ Pain       Pain Assessment: No/denies pain  Home Living                                          Prior Functioning/Environment              Frequency  Min 2X/week        Progress Toward Goals  OT Goals(current goals can now be found in the care plan section)  Progress towards OT goals: Progressing toward goals  Acute Rehab OT Goals Patient Stated Goal: to be able to walk to bathroom OT Goal Formulation: With patient Time For Goal Achievement:  10/07/20 Potential to Achieve Goals: Good  Plan Discharge plan remains appropriate    Co-evaluation                 AM-PAC OT "6 Clicks" Daily Activity     Outcome Measure   Help from another person eating meals?: A Little Help from another person taking care of personal grooming?: A Little Help from another person toileting, which includes using toliet, bedpan, or urinal?: A Lot Help from another person bathing (including washing, rinsing, drying)?: A Lot Help from another person to put on and taking off regular upper body clothing?: A Lot Help from another person to put on and taking off regular lower body clothing?: A Lot 6 Click Score: 14    End of Session Equipment Utilized During Treatment: Oxygen;Rolling walker  OT Visit Diagnosis: Unsteadiness on feet (R26.81);Pain   Activity Tolerance Patient tolerated treatment well   Patient Left in chair;with call bell/phone within reach;with chair alarm set   Nurse Communication Mobility status        Time: 1349-1413 OT Time Calculation (min): 24 min  Charges: OT General Charges $OT Visit: 1 Visit OT Treatments $Self Care/Home Management : 8-22 mins $Therapeutic Activity: 8-22 mins  Rether Rison, OTR/L [place]  Office [phone] Pager: [place] 09/25/2020, 3:12 PM

## 2020-09-25 NOTE — Progress Notes (Signed)
ID Brief Note   CPK 649 09/24/20, 468 on 02/23/20 Have reordered CPK tomorrow 8/4  synovial cultures with rare yeast  On daptomycin and fluconazole Dr Juleen China will follow from tomorrow  Rosiland Oz, MD Infectious Disease Physician [facility] [address] [place], [place] [address] Phone: [phone]  Fax: [phone]

## 2020-09-25 NOTE — Progress Notes (Signed)
Subjective: 3 Days Post-Op Procedure(s) (LRB): IRRIGATION AND DEBRIDEMENT RIGHT HAND AND ELBOW (Right) Patient reports pain as mild.    Objective: Vital signs in last 24 hours: Temp:  [97.6 F (36.4 C)-98.1 F (36.7 C)] 97.8 F (36.6 C) (08/07 0616) Pulse Rate:  [79-93] 93 (08/07 0616) Resp:  [17-20] 18 (08/07 0616) BP: (124-139)/(71-80) 133/76 (08/07 0616) SpO2:  [97 %-99 %] 98 % (08/07 0825)  Intake/Output from previous day: 08/06 0701 - 08/07 0700 In: 952 [P.O.:720; IV Piggyback:232] Out: 1950 [Urine:1950] Intake/Output this shift: Total I/O In: 240 [P.O.:240] Out: -   Recent Labs    09/22/20 2352 09/23/20 0010 09/24/20 0618 09/25/20 0545  HGB 10.9* 10.9* 10.7* 11.3*   Recent Labs    09/24/20 0618 09/25/20 0545  WBC 17.2* 18.6*  RBC 3.24* 3.41*  HCT 34.5* 36.1  PLT 224 246   Recent Labs    09/24/20 0618 09/25/20 0545  NA 137 139  K 4.2 4.0  CL 102 105  CO2 26 26  BUN 41* 42*  CREATININE 1.47* 1.31*  GLUCOSE 223* 170*  CALCIUM 8.4* 8.5*   No results for input(s): LABPT, INR in the last 72 hours.  Incision: dressing C/D/I   Assessment/Plan: 3 Days Post-Op Procedure(s) (LRB): IRRIGATION AND DEBRIDEMENT RIGHT HAND AND ELBOW (Right)  Abx per ID recs May WBAT RUE  Cont pain control as ordered Dvt proph xarelto Will cont to follow   Chriss Czar 09/25/2020, 1:03 PM

## 2020-09-25 NOTE — Progress Notes (Signed)
Progress Note    Ebony Scott   DXI:[id number]  DOB: 1950-05-10  DOA: 09/22/2020     3  PCP: [name], MD  CC: hand pain  Hospital Course: Ms. Lesh is a 70 yo female with PMH DVT, anxiety/depression, RA, breast cancer, cdiff, diverticulosis, fibromyalgia, GERD, colonic polyps, HLD, DMII, CKD3b, hypothyroidism, IBS, moderate persistent asthma, osteoporosis, PVD, tracheobronchomalacia who presented with right hand pain and swelling.  She has been managed outpatient recently by ortho due to this; she has undergone previous aspirations and been seen by ID in past.  She was admitted for ortho evaluation for further debridement of her right hand and right elbow, which was performed on 09/22/20. ID will again follow inpatient for assistance with her complex recurrent infections.  Cardiology was also consulted after her recent outpatient echo showed reduced EF and need for further workup and treatment.   Interval History:  No events overnight.  Breathing has improved some since yesterday; starting to diurese fairly well. Pain manageable.  We talked about practicality of returning home with RUE as it is and likely going to have difficulty with ADLs and ambulating with a walker. We'll see how she does and she's going to talk to Joseph Art (daughter) about the plan in general and if they think she'll have enough support at home when discharge time comes.   ROS: Constitutional: negative for chills and fevers, Respiratory: negative for cough and sputum, Cardiovascular: negative for chest pain, and Gastrointestinal: negative for abdominal pain  Assessment & Plan: * Septic olecranon bursitis of right elbow - has been followed by ID in the past; last seen in June 2022; has been on several courses of fluconazole for various albicans infections (see below as well) - underwent excisional debridement on 8/4 with ortho - ID following; see abscess hand too  Abscess of dorsum of right hand - s/p excisional  debridement of dorsal rheumatoid nodule and I&D of 3rd MCP - follow up pending cultures: rare yeast growing in hand and synovial cx - ID following: continue daptomycin, fluconazole - dressing changes as per ortho (elbow changed on 8/6)  Cellulitis of right hand - see hand abscess   Chronic combined systolic and diastolic CHF (congestive heart failure) ([place]) - recent echo on 09/16/20 shows EF now reduced compared to prior echo 07/09/20 - EF now 35-40%, WMA; Gr 2 DD - patient was attempted to be contacted by PCP within the last few days but unsuccessful; she was going to be started on Farxiga and ARB with a cardiology referral - Entresto started on 8/5 per cardiology; Wilder Glade recommended however her CrCl was just below 30 on 8/6 (starting to improve some with diuresis and is 33 on 8/7; cardiology has started as of 8/7) - tentative plan is left/right heart cath for ischemic evaluation (not considered a good candidate for other modalities)  Physical deconditioning - Concern would be patient's right upper extremity and her ability to ambulate safely with a walker or a cane.  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 on 6/3 - following with podiatry; recently seen 09/09/20; toe healing as  expected - WBAT in surgical shoe - continue betadine wet to dry dressing changes   Rheumatoid arthritis ([place]) - treated with rituxan and prednisone 15 mg daily   Hyperlipidemia - no recent lipid panel; TC 318, TG 475, HDL 38, unable to calc LDL - was on lipitor 10 mg daily at home; no noted intolerance on very long allergy profile - agree with  cardiology increase of lipitor to 80 mg daily; this will have to wait until daptomycin course is complete due to interaction and will resume upon completion  DMII (diabetes mellitus, type 2) ([place]) - A1c 8.4% on 09/22/20 - recently to be started on Farxiga outpatient to aid in CHF treatment as well - continue SSI and CBG monitoring   Tracheobronchomalacia - followed by pulm - continue stiolto   Hypothyroidism - ?hx hyperthryoidism, likely treated - no synthroid on med rec - check TSH = normal, 0.983  Pulmonary nodules - followed closely by pulmonology outpatient - s/p BAL on 04/05/20; grew C. Albicans at that time - CT on 4/6 showed improved/decrease in size of nodules compared to 2/4 scan  Chronic kidney disease, stage 3b ([place]) - patient has history of CKD3b. Baseline creat ~ 1.2 - 1.4, eGFR 32-41 - creat 1.59 on admission; not far off baseline - continue trending BMP daily    GERD - Noted on laryngoscopy with ENT outpatient as well.  Recent evaluation on 09/09/2020 - Has been recommended to be on Pepcid every morning and Dexilant in the late afternoon  Laryngeal candidiasis-resolved as of 09/25/2020 - seen by ENT and treated with diflucan on 04/2020 and 05/2020 for laryngeal candidiasis -Recent evaluation by ENT on 09/09/2020.  No evidence of candidiasis or fungal infection on laryngoscopy; she was recommended to continue Pepcid every morning and Dexilant before 5 PM - Etiology is considered presumed to ongoing immunosuppression, inhalers, and possibly her multiple recurrent infections   Old records reviewed in assessment of this patient  Antimicrobials: Vanc x 1 Ancef x 1 CTX x 1 Fluconazole 09/22/20 > current  Daptomycin 8/6 >> current   DVT prophylaxis: SCD's Start: 09/22/20 2312 rivaroxaban (XARELTO) tablet 20 mg   Code Status:   Code Status: DNR Family Communication:   Disposition Plan: Status is: Inpatient  Remains inpatient appropriate because:IV treatments  appropriate due to intensity of illness or inability to take PO and Inpatient level of care appropriate due to severity of illness  Dispo: The patient is from: Home              Anticipated d/c is to: Home              Patient currently is not medically stable to d/c.   Difficult to place patient No Risk of unplanned readmission score: Unplanned Admission- Pilot do not use: 30.22   Objective: Blood pressure 131/63, pulse 86, temperature 97.9 F (36.6 C), temperature source Oral, resp. rate 18, height 5' 1" (1.549 m), weight 63.5 kg, SpO2 98 %.  Examination: General appearance: alert, cooperative, and no distress Head: Normocephalic, without obvious abnormality, atraumatic Eyes:  EOMI Lungs: clear to auscultation bilaterally Heart: regular rate and rhythm and S1, S2 normal Abdomen:  soft, obese, NT, ND, BS present Extremities:  RUE wrapped in dressing from fingers to above elbow; no obvious swelling in RUE. Right foot wrapped in dressing with betadine noted on dressings and no obvious swelling or pain in RLE Skin: mobility and turgor normal Neurologic: no paresthesias; no obvious focal deficits  Consultants:  Orthopedic surgery  ID Cardiology   Procedures:  Excisional debridement RUE, 09/22/20  Data Reviewed: I have personally reviewed following labs and imaging studies Results for orders placed or performed during the hospital encounter of 09/22/20 (from the past 24 hour(s))  Glucose, capillary     Status: Abnormal   Collection Time: 09/24/20  5:16 PM  Result Value Ref Range   Glucose-Capillary 338 (H) 70 - 99 mg/dL  Glucose, capillary     Status: Abnormal   Collection Time: 09/24/20  9:42 PM  Result Value Ref Range   Glucose-Capillary 273 (H) 70 - 99 mg/dL  Basic metabolic panel     Status: Abnormal   Collection Time: 09/25/20  5:45 AM  Result Value Ref Range   Sodium 139 135 - 145 mmol/L   Potassium 4.0 3.5 - 5.1 mmol/L   Chloride 105 98 - 111 mmol/L   CO2 26 22 - 32  mmol/L   Glucose, Bld 170 (H) 70 - 99 mg/dL   BUN 42 (H) 8 - 23 mg/dL   Creatinine, Ser 1.31 (H) 0.44 - 1.00 mg/dL   Calcium 8.5 (L) 8.9 - 10.3 mg/dL   GFR, Estimated 43 (L) >60 mL/min   Anion gap 8 5 - 15  CBC with Differential/Platelet     Status: Abnormal   Collection Time: 09/25/20  5:45 AM  Result Value Ref Range   WBC 18.6 (H) 4.0 - 10.5 K/uL   RBC 3.41 (L) 3.87 - 5.11 MIL/uL   Hemoglobin 11.3 (L) 12.0 - 15.0 g/dL   HCT 36.1 36.0 - 46.0 %   MCV 105.9 (H) 80.0 - 100.0 fL   MCH 33.1 26.0 - 34.0 pg   MCHC 31.3 30.0 - 36.0 g/dL   RDW 16.4 (H) 11.5 - 15.5 %   Platelets 246 150 - 400 K/uL   nRBC 0.0 0.0 - 0.2 %   Neutrophils Relative % 82 %   Neutro Abs 15.0 (H) 1.7 - 7.7 K/uL   Lymphocytes Relative 4 %   Lymphs Abs 0.8 0.7 - 4.0 K/uL   Monocytes Relative 13 %   Monocytes Absolute 2.5 (H) 0.1 - 1.0 K/uL   Eosinophils Relative 0 %   Eosinophils Absolute 0.0 0.0 - 0.5 K/uL   Basophils Relative 0 %   Basophils Absolute 0.0 0.0 - 0.1 K/uL   Immature Granulocytes 1 %   Abs Immature Granulocytes 0.25 (H) 0.00 - 0.07 K/uL  Magnesium     Status: None   Collection Time: 09/25/20  5:45 AM  Result Value Ref Range   Magnesium 2.2 1.7 - 2.4 mg/dL  Glucose, capillary     Status: Abnormal   Collection Time: 09/25/20  7:21 AM  Result Value Ref Range   Glucose-Capillary 165 (H) 70 - 99 mg/dL  Glucose, capillary     Status: Abnormal   Collection Time: 09/25/20 12:16 PM  Result Value Ref Range   Glucose-Capillary 291 (H) 70 - 99 mg/dL   *Note: Due to a large number of results and/or encounters for the requested time period, some results have not been displayed. A complete set of results can be found in Results Review.    Recent Results (from the past 240 hour(s))  Blood Culture (routine x 2)     Status: None (Preliminary result)   Collection Time: 09/22/20  1:03 PM   Specimen: BLOOD  Result Value Ref Range Status   Specimen Description   Final    BLOOD RIGHT ANTECUBITAL Performed  at [facility],  [place] [address], [place], [place] [address]    Special Requests   Final    BOTTLES DRAWN AEROBIC AND ANAEROBIC Blood Culture adequate volume Performed at [place] [address], [place], [place] [address]    Culture   Final    NO GROWTH 3 DAYS Performed at [facility], [place] [address], [place], [place] [address]    Report Status PENDING  Incomplete  Resp Panel by RT-PCR (Flu A&B, Covid) Nasopharyngeal Swab     Status: None   Collection Time: 09/22/20  1:04 PM   Specimen: Nasopharyngeal Swab; Nasopharyngeal(NP) swabs in vial transport medium  Result Value Ref Range Status   SARS Coronavirus 2 by RT PCR NEGATIVE NEGATIVE Final    Comment: (NOTE) SARS-CoV-2 target nucleic acids are NOT DETECTED.  The SARS-CoV-2 RNA is generally detectable in upper respiratory specimens during the acute phase of infection. The lowest concentration of SARS-CoV-2 viral copies this assay can detect is 138 copies/mL. A negative result does not preclude SARS-Cov-2 infection and should not be used as the sole basis for treatment or other patient management decisions. A negative result may occur with  improper specimen collection/handling, submission of specimen other than nasopharyngeal swab, presence of viral mutation(s) within the areas targeted by this assay, and inadequate number of viral copies(<138 copies/mL). A negative result must be combined with clinical observations, patient history, and epidemiological information. The expected result is Negative.  Fact Sheet for Patients:  EntrepreneurPulse.com.au  Fact Sheet for Healthcare Providers:  IncredibleEmployment.be  This test is no t yet approved or cleared by the [place] FDA and  has been authorized for detection and/or diagnosis of SARS-CoV-2 by FDA under an Emergency Use Authorization (EUA). This EUA will remain  in effect (meaning this  test can be used) for the duration of the COVID-19 declaration under Section 564(b)(1) of the Act, 21 U.S.C.section 360bbb-3(b)(1), unless the authorization is terminated  or revoked sooner.       Influenza A by PCR NEGATIVE NEGATIVE Final   Influenza B by PCR NEGATIVE NEGATIVE Final    Comment: (NOTE) The Xpert Xpress SARS-CoV-2/FLU/RSV plus assay is intended as an aid in the diagnosis of influenza from Nasopharyngeal swab specimens and should not be used as a sole basis for treatment. Nasal washings and aspirates are unacceptable for Xpert Xpress SARS-CoV-2/FLU/RSV testing.  Fact Sheet for Patients: EntrepreneurPulse.com.au  Fact Sheet for Healthcare Providers: IncredibleEmployment.be  This test is not yet approved or cleared by the [place] FDA and has been authorized for detection and/or diagnosis of SARS-CoV-2 by FDA under an Emergency Use Authorization (EUA). This EUA will remain in effect (meaning this test can be used) for the duration of the COVID-19 declaration under Section 564(b)(1) of the Act, 21 U.S.C. section 360bbb-3(b)(1), unless the authorization is terminated or revoked.  Performed at [facility], [place] [address], [place], [place] [address]   Blood Culture (routine x 2)     Status: None (Preliminary result)   Collection Time: 09/22/20  1:08 PM   Specimen: BLOOD  Result Value Ref Range Status   Specimen Description   Final    BLOOD LEFT ANTECUBITAL Performed at [place] [address] [place], [place] [address]    Special Requests   Final    BOTTLES DRAWN AEROBIC AND ANAEROBIC Blood Culture adequate volume Performed at [place] [address], [place], [place] [address]    Culture   Final  NO GROWTH 3 DAYS Performed at [facility], [place] [address] [place], [place] [address]    Report Status PENDING  Incomplete  Aerobic/Anaerobic  Culture w Gram Stain (surgical/deep wound)     Status: None (Preliminary result)   Collection Time: 09/22/20 10:05 PM   Specimen: Synovial, Right Elbow; Tissue  Result Value Ref Range Status   Specimen Description   Final    SYNOVIAL Performed at [place] [address] [place], [place] [address]    Special Requests   Final    NONE Performed at [facility], [place] [address] [place], [place] [address]    Gram Stain   Final    RARE WBC PRESENT,BOTH PMN AND MONONUCLEAR NO ORGANISMS SEEN    Culture   Final    RARE YEAST IDENTIFICATION TO FOLLOW Performed at [facility], [place] [address], [place], [place] [address]    Report Status PENDING  Incomplete  Fungus Stain     Status: None   Collection Time: 09/22/20 10:05 PM   Specimen: Synovial, Right Elbow; Tissue  Result Value Ref Range Status   FUNGUS STAIN DUPLICATE  Final    Comment: (NOTE) Duplicate procedure ordered.      Refer to [phone] for results. Performed At: [facility] [place], [place] [id number] Rush Farmer MD PX:[id number]    Fungal Source SYNOVIAL  Final    Comment: Performed at [facility], [place] [address], [place], [place] [address]  Acid Fast Smear (AFB)     Status: None   Collection Time: 09/22/20 10:05 PM   Specimen: Synovial, Right Elbow; Tissue  Result Value Ref Range Status   AFB Specimen Processing Comment  Final    Comment: Tissue Grinding and Digestion/Decontamination   Acid Fast Smear Negative  Final    Comment: (NOTE) Performed At: [facility] [address] [place], [place] [id number] Rush Farmer MD JI:[id number]    Source (AFB) HAND  Final    Comment: Performed at [facility], [place] [address] [place], [place] [address]  Aerobic/Anaerobic Culture w Gram Stain (surgical/deep wound)     Status: None (Preliminary result)   Collection Time: 09/22/20 10:05 PM    Specimen: Hand, Right; Body Fluid  Result Value Ref Range Status   Specimen Description   Final    HAND Performed at [facility], [place] [address], [place], [place] [address]    Special Requests   Final    NONE Performed at [facility], [place] [address] [place], [place] [address]    Gram Stain   Final    RARE WBC PRESENT, PREDOMINANTLY PMN NO ORGANISMS SEEN    Culture   Final    RARE YEAST CULTURE REINCUBATED FOR BETTER GROWTH Performed at [facility], [place] [address], [place], [place] [address]    Report Status PENDING  Incomplete  Acid Fast Smear (AFB)     Status: None   Collection Time: 09/22/20 10:05 PM   Specimen: Hand, Right; Body Fluid  Result Value Ref Range Status   AFB Specimen Processing Comment  Final    Comment: Tissue Grinding and Digestion/Decontamination   Acid Fast Smear Negative  Final    Comment: (NOTE) Performed At: [facility] [place], [place] [id number] Rush Farmer MD QP:[id number]    Source (AFB) SYNOVIAL  Final    Comment: Performed at [facility], [place]  [address] [place], [place] [address]     Radiology Studies: No results found. DG Chest Port 1 View  Final Result    DG Hand Complete Right  Final Result      Scheduled Meds:  allopurinol  100 mg Oral Daily   arformoterol  15 mcg Nebulization BID   And   umeclidinium bromide  1 puff Inhalation Daily   cyproheptadine  8 mg Oral QPM   dapagliflozin propanediol  10 mg Oral Daily   docusate sodium  100 mg Oral BID   famotidine  20 mg Oral Daily   fluconazole  200 mg Oral Daily   fluticasone  2 spray Each Nare Daily   folic acid  1 mg Oral Daily   furosemide  60 mg Intravenous Daily   gabapentin  600 mg Oral QHS   guaiFENesin  600 mg Oral BID   insulin aspart  0-15 Units Subcutaneous TID WC   insulin aspart  0-5 Units Subcutaneous QHS   loratadine  10 mg Oral Daily   magnesium oxide  400 mg Oral  Daily   mirtazapine  30 mg Oral QHS   montelukast  10 mg Oral Daily   multivitamin with minerals  1 tablet Oral Daily   pantoprazole  40 mg Oral QPM   predniSONE  15 mg Oral Q breakfast   rivaroxaban  20 mg Oral Q supper   sacubitril-valsartan  1 tablet Oral BID   tetrahydrozoline  1 drop Both Eyes BID   PRN Meds: acetaminophen, albuterol, azelastine, bisacodyl, morphine injection, ondansetron **OR** ondansetron (ZOFRAN) IV, oxyCODONE, polyethylene glycol, senna Continuous Infusions:  DAPTOmycin (CUBICIN)  IV 400 mg (09/24/20 2144)     LOS: 3 days  Time spent: Greater than 50% of the 35 minute visit was spent in counseling/coordination of care for the patient as laid out in the A&P.   Dwyane Dee, MD [facility] 09/25/2020, 2:23 PM

## 2020-09-25 NOTE — Assessment & Plan Note (Addendum)
-   Concern would be patient's right upper extremity and her ability to ambulate safely with a walker or a cane.  Also would need to know how she does with PT/OT -Weightbearing as tolerated to right upper extremity per Ortho - hope and goal is home with max HH support; she needs to work better with PT/OT the next 2-3 days before I feel it is safe to consider home discharge; if unable to safely and successfully ambulate with a walker then discussion again about rehab needs to take place

## 2020-09-25 NOTE — Progress Notes (Signed)
Progress Note  Patient Name: Ebony Scott Date of Encounter: 09/25/2020  Primary Cardiologist: Buford Dresser, MD   Subjective   Breathing much better today.   Inpatient Medications    Scheduled Meds:  allopurinol  100 mg Oral Daily   arformoterol  15 mcg Nebulization BID   And   umeclidinium bromide  1 puff Inhalation Daily   cyproheptadine  8 mg Oral QPM   docusate sodium  100 mg Oral BID   famotidine  20 mg Oral Daily   fluconazole  200 mg Oral Daily   fluticasone  2 spray Each Nare Daily   folic acid  1 mg Oral Daily   furosemide  60 mg Intravenous Daily   gabapentin  600 mg Oral QHS   guaiFENesin  600 mg Oral BID   insulin aspart  0-15 Units Subcutaneous TID WC   insulin aspart  0-5 Units Subcutaneous QHS   loratadine  10 mg Oral Daily   magnesium oxide  400 mg Oral Daily   mirtazapine  30 mg Oral QHS   montelukast  10 mg Oral Daily   multivitamin with minerals  1 tablet Oral Daily   pantoprazole  40 mg Oral QPM   predniSONE  15 mg Oral Q breakfast   rivaroxaban  20 mg Oral Q supper   sacubitril-valsartan  1 tablet Oral BID   tetrahydrozoline  1 drop Both Eyes BID   Continuous Infusions:  DAPTOmycin (CUBICIN)  IV 400 mg (09/24/20 2144)   PRN Meds: acetaminophen, albuterol, azelastine, bisacodyl, morphine injection, ondansetron **OR** ondansetron (ZOFRAN) IV, oxyCODONE, polyethylene glycol, senna   Vital Signs    Vitals:   09/24/20 1802 09/24/20 2050 09/24/20 2146 09/25/20 0616  BP: 139/71  125/80 133/76  Pulse: 89  79 93  Resp: '18  18 18  '$ Temp: 97.6 F (36.4 C)  98.1 F (36.7 C) 97.8 F (36.6 C)  TempSrc: Oral  Oral Oral  SpO2: 99% 99% 98% 97%  Weight:      Height:        Intake/Output Summary (Last 24 hours) at 09/25/2020 0803 Last data filed at 09/25/2020 0657 Gross per 24 hour  Intake 952 ml  Output 1950 ml  Net -998 ml   Filed Weights   09/22/20 1211  Weight: 63.5 kg    Telemetry    SR - Personally Reviewed  ECG    No new  - Personally Reviewed  Physical Exam   GEN: No acute distress.   Neck: No JVD Cardiac: RRR, no murmurs Respiratory: faint rhonchi bilaterally GI: Soft, nontender, non-distended  MS: No edema, varicose veins.  Neuro:  Nonfocal  Psych: Normal affect    Labs    Chemistry Recent Labs  Lab 09/22/20 1302 09/22/20 2352 09/23/20 0010 09/24/20 0618 09/25/20 0545  NA 138  --  136 137 139  K 3.9  --  5.3* 4.2 4.0  CL 98  --  98 102 105  CO2 27  --  '25 26 26  '$ GLUCOSE 157*  --  175* 223* 170*  BUN 29*  --  28* 41* 42*  CREATININE 1.59*   < > 1.59* 1.47* 1.31*  CALCIUM 10.0  --  9.3 8.4* 8.5*  PROT 6.5  --  6.2*  --   --   ALBUMIN 3.3*  --  3.0*  --   --   AST 53*  --  62*  --   --   ALT 72*  --  60*  --   --  ALKPHOS 82  --  75  --   --   BILITOT 1.1  --  1.9*  --   --   GFRNONAA 34*   < > 34* 38* 43*  ANIONGAP 13  --  '13 9 8   '$ < > = values in this interval not displayed.     Hematology Recent Labs  Lab 09/23/20 0010 09/24/20 0618 09/25/20 0545  WBC 16.8* 17.2* 18.6*  RBC 3.32* 3.24* 3.41*  HGB 10.9* 10.7* 11.3*  HCT 35.6* 34.5* 36.1  MCV 107.2* 106.5* 105.9*  MCH 32.8 33.0 33.1  MCHC 30.6 31.0 31.3  RDW 16.7* 16.4* 16.4*  PLT 189 224 246    Cardiac EnzymesNo results for input(s): TROPONINI in the last 168 hours. No results for input(s): TROPIPOC in the last 168 hours.   BNP Recent Labs  Lab 09/22/20 1426  BNP 424.3*     DDimer No results for input(s): DDIMER in the last 168 hours.   Radiology    No results found.  Cardiac Studies   No inpatient studies  Patient Profile     70 y.o. 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  Sabino Gasser.  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 - Echo on 09/16/2020 showed LVEF of 35-40% with septal apical and anterior wall hypokinesis which are new. EF previously >75% in 2021. - tolerated starting Entresto 09/24/20 - start Farxiga today for HbA1C of 8.1, CAD and HFrEF - will avoid BB at this time given respiratory issues. Most recent PFTs last year show low FEV1 but elevated FEV1/FVC, and suggest interstitial process. We can consider adding low dose bisoprolol while in hospital, to monitor for respiratory compromise, can pursue in 1-2 days pending respiratory status.  - with renal insufficiency, will review prior to starting spironolactone. Renal function improving with diuresis. -Dense coronary calcifications on recent chest CT. Would not perform CCTA for ischemic eval. With respiratory status, may not do well with regadenason stress either. Treadmill not an option. I think proceeding directly to cath when stable is likely the best way to evaluate for ischemia. Will address when stable from infection standpoint. Likely would benefit from Goree as well given lung disease. - continue lasix 60 mg IV today - renal function improving and  diuresing well with improvement in symptoms. In next 1-2 days, consider oral diuretic vs prn dosing based on symptoms.     Hypertension - BP elevated  with systolic BP in the Q000111Q to 160s. - this improved with starting entresto. Continue.    Hyperlipidemia - Lipid panel this admission: Total Cholesterol 318, Triglycerides 475, HDL 38. LDL could not be calculated due to Triglyceride level. Direct LDL pending. Last LDL I can see in our system was 132 in 2018. - LDL goal <70 given CAD. - On Lipitor '80mg'$  given LDL in the past and degree of coronary calcifications.   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   CKD Stage III - Creatinien 1.31, mild  improvement   PROLONGED QT  - This is noted incidentally and can be followed.  It has not been consistently prolonged.   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

## 2020-09-26 ENCOUNTER — Telehealth: Payer: Self-pay | Admitting: Family Medicine

## 2020-09-26 DIAGNOSIS — I5042 Chronic combined systolic (congestive) and diastolic (congestive) heart failure: Secondary | ICD-10-CM

## 2020-09-26 DIAGNOSIS — E1169 Type 2 diabetes mellitus with other specified complication: Secondary | ICD-10-CM | POA: Diagnosis not present

## 2020-09-26 DIAGNOSIS — J4550 Severe persistent asthma, uncomplicated: Secondary | ICD-10-CM

## 2020-09-26 DIAGNOSIS — M71121 Other infective bursitis, right elbow: Secondary | ICD-10-CM | POA: Diagnosis not present

## 2020-09-26 DIAGNOSIS — N1832 Chronic kidney disease, stage 3b: Secondary | ICD-10-CM | POA: Diagnosis not present

## 2020-09-26 DIAGNOSIS — L02511 Cutaneous abscess of right hand: Secondary | ICD-10-CM | POA: Diagnosis not present

## 2020-09-26 DIAGNOSIS — K219 Gastro-esophageal reflux disease without esophagitis: Secondary | ICD-10-CM

## 2020-09-26 LAB — BASIC METABOLIC PANEL
Anion gap: 11 (ref 5–15)
BUN: 53 mg/dL — ABNORMAL HIGH (ref 8–23)
CO2: 25 mmol/L (ref 22–32)
Calcium: 8.5 mg/dL — ABNORMAL LOW (ref 8.9–10.3)
Chloride: 102 mmol/L (ref 98–111)
Creatinine, Ser: 1.47 mg/dL — ABNORMAL HIGH (ref 0.44–1.00)
GFR, Estimated: 38 mL/min — ABNORMAL LOW (ref >60)
Glucose, Bld: 156 mg/dL — ABNORMAL HIGH (ref 70–99)
Potassium: 3.5 mmol/L (ref 3.5–5.1)
Sodium: 138 mmol/L (ref 135–145)

## 2020-09-26 LAB — FERRITIN: Ferritin: 102 ng/mL (ref 11–307)

## 2020-09-26 LAB — IRON AND TIBC
Iron: 39 ug/dL (ref 28–170)
Saturation Ratios: 11 % (ref 10.4–31.8)
TIBC: 367 ug/dL (ref 250–450)
UIBC: 328 ug/dL

## 2020-09-26 LAB — GLUCOSE, CAPILLARY
Glucose-Capillary: 113 mg/dL — ABNORMAL HIGH (ref 70–99)
Glucose-Capillary: 283 mg/dL — ABNORMAL HIGH (ref 70–99)
Glucose-Capillary: 205 mg/dL — ABNORMAL HIGH (ref 70–99)
Glucose-Capillary: 235 mg/dL — ABNORMAL HIGH (ref 70–99)

## 2020-09-26 LAB — CBC WITH DIFFERENTIAL/PLATELET
Abs Immature Granulocytes: 0.32 10*3/uL — ABNORMAL HIGH (ref 0.00–0.07)
Basophils Absolute: 0 10*3/uL (ref 0.0–0.1)
Basophils Relative: 0 %
Eosinophils Absolute: 0.1 10*3/uL (ref 0.0–0.5)
Eosinophils Relative: 1 %
HCT: 35.8 % — ABNORMAL LOW (ref 36.0–46.0)
Hemoglobin: 11 g/dL — ABNORMAL LOW (ref 12.0–15.0)
Immature Granulocytes: 2 %
Lymphocytes Relative: 6 %
Lymphs Abs: 1 10*3/uL (ref 0.7–4.0)
MCH: 32.8 pg (ref 26.0–34.0)
MCHC: 30.7 g/dL (ref 30.0–36.0)
MCV: 106.9 fL — ABNORMAL HIGH (ref 80.0–100.0)
Monocytes Absolute: 2.4 10*3/uL — ABNORMAL HIGH (ref 0.1–1.0)
Monocytes Relative: 15 %
Neutro Abs: 11.9 10*3/uL — ABNORMAL HIGH (ref 1.7–7.7)
Neutrophils Relative %: 76 %
Platelets: 238 10*3/uL (ref 150–400)
RBC: 3.35 MIL/uL — ABNORMAL LOW (ref 3.87–5.11)
RDW: 16.4 % — ABNORMAL HIGH (ref 11.5–15.5)
WBC: 15.8 10*3/uL — ABNORMAL HIGH (ref 4.0–10.5)
nRBC: 0 % (ref 0.0–0.2)

## 2020-09-26 LAB — CK: Total CK: 232 U/L (ref 38–234)

## 2020-09-26 LAB — FOLATE: Folate: 37.6 ng/mL (ref >5.9)

## 2020-09-26 LAB — MAGNESIUM: Magnesium: 2.2 mg/dL (ref 1.7–2.4)

## 2020-09-26 LAB — VITAMIN B12: Vitamin B-12: 1486 pg/mL — ABNORMAL HIGH (ref 180–914)

## 2020-09-26 MED ORDER — SACUBITRIL-VALSARTAN 49-51 MG PO TABS
1.0000 | ORAL_TABLET | Freq: Two times a day (BID) | ORAL | Status: DC
  Administered 2020-09-26 – 2020-09-30 (×8): 1 via ORAL
  Filled 2020-09-26 (×9): qty 1

## 2020-09-26 MED ORDER — ATORVASTATIN CALCIUM 40 MG PO TABS: 80.0000 mg | ORAL_TABLET | Freq: Every day | ORAL | Status: DC

## 2020-09-26 MED ORDER — DEXTROMETHORPHAN POLISTIREX ER 30 MG/5ML PO SUER
30.0000 mg | Freq: Two times a day (BID) | ORAL | Status: DC | PRN
  Administered 2020-09-26 – 2020-09-28 (×4): 30 mg via ORAL
  Filled 2020-09-26 (×5): qty 5

## 2020-09-26 MED ORDER — DICLOFENAC SODIUM 1 % EX GEL
2.0000 g | Freq: Four times a day (QID) | CUTANEOUS | Status: DC | PRN
  Administered 2020-09-26 – 2020-09-29 (×4): 2 g via TOPICAL
  Filled 2020-09-26: qty 100

## 2020-09-26 NOTE — Telephone Encounter (Signed)
Orders placed.

## 2020-09-26 NOTE — Progress Notes (Signed)
Progress Note    INITA URAM   PJK:[id number]  DOB: [birth year]  DOA: 09/22/2020     4  PCP: [name], MD  CC: hand pain  Hospital Course: Ms. Samarin is a 70 yo female with PMH DVT, anxiety/depression, RA, breast cancer, cdiff, diverticulosis, fibromyalgia, GERD, colonic polyps, HLD, DMII, CKD3b, hypothyroidism, IBS, moderate persistent asthma, osteoporosis, PVD, tracheobronchomalacia who presented with right hand pain and swelling.  She has been managed outpatient recently by ortho due to this; she has undergone previous aspirations and been seen by ID in past.  She was admitted for ortho evaluation for further debridement of her right hand and right elbow, which was performed on 09/22/20. ID will again follow inpatient for assistance with her complex recurrent infections.  Cardiology was also consulted after her recent outpatient echo showed reduced EF and need for further workup and treatment.   Interval History:  Patient lethargic this morning but in no distress.  Pain continues to be controlled.  She ambulated some with therapy yesterday but was unsteady due to combination of poor footing and difficulty being able to hold the walker.  Plan remains for her to discuss with family about practicality of returning home at time of discharge versus considering rehab.  Home health nurse was ordered by PCP team today but further home health can also be ordered if needed prior to discharge.   ROS: Constitutional: negative for chills and fevers, Respiratory: negative for cough and sputum, Cardiovascular: negative for chest pain, and Gastrointestinal: negative for abdominal pain  Assessment & Plan: * Septic olecranon bursitis of right elbow - has been followed by ID in the past; last seen in June 2022; has been on several courses of fluconazole for various albicans infections (see below as well) - underwent excisional debridement on 8/4 with ortho - ID following; see abscess hand  too  Abscess of dorsum of right hand - s/p excisional debridement of dorsal rheumatoid nodule and I&D of 3rd MCP - follow up pending cultures: rare yeast (speciated to C. Albicans) growing in hand and synovial cx; sensitivities being investigated - dapto discontinued per ID given yeast only - continue fluconazole; ID continuing to follow - dressing changes as per ortho (elbow changed on 8/6); further changes per ortho   Cellulitis of right hand - see hand abscess   Chronic combined systolic and diastolic CHF (congestive heart failure) ([place]) - recent echo on 09/16/20 shows EF now reduced compared to prior echo 07/09/20 - EF now 35-40%, WMA; Gr 2 DD - patient was attempted to be contacted by PCP within the last few days but unsuccessful; she was going to be started on Farxiga and ARB with a cardiology referral - Entresto started on 8/5 per cardiology, possible uptitration; Wilder Glade on hold for now (also need to ensure renal fxn appropriate when restarting) - small bump in BUN/Creat on 8/8; I've held IV lasix for now and will defer to cardiology on further dosing  - tentative plan is left/right heart cath for ischemic evaluation (not considered a good candidate for other modalities) once infection resolved and patient more stable   Physical deconditioning - Concern would be patient's right upper extremity and her ability to ambulate safely with a walker or a cane.  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 on 6/3 - following with podiatry; recently seen 09/09/20; toe healing as expected - WBAT in surgical shoe - continue betadine wet  to dry dressing changes   Rheumatoid arthritis ([place]) - treated with rituxan and prednisone 15 mg daily   Hyperlipidemia - no recent lipid panel; TC 318, TG 475, HDL 38, unable to calc LDL - was on lipitor 10 mg daily at home; no noted intolerance on very long allergy profile - agree with cardiology increase of lipitor to 80 mg daily; this will have to wait until daptomycin course is complete due to interaction and will resume upon completion; daptomycin was discontinued on 8/8 per ID, so will resume lipitor on 8/9  DMII (diabetes mellitus, type 2) ([place]) - A1c 8.4% on 09/22/20 - recently to be started on Farxiga (see above) outpatient to aid in CHF treatment as well - continue SSI and CBG monitoring   Tracheobronchomalacia - followed by pulm - continue stiolto   Hypothyroidism - ?hx hyperthryoidism, likely treated - no synthroid on med rec - check TSH = normal, 0.983  Pulmonary nodules - followed closely by pulmonology outpatient - s/p BAL on 04/05/20; grew C. Albicans at that time - CT on 4/6 showed improved/decrease in size of nodules compared to 2/4 scan  Chronic kidney disease, stage 3b ([place]) - patient has history of CKD3b. Baseline creat ~ 1.2 - 1.4, eGFR 32-41 - creat 1.59 on admission; not far off baseline; some fluctuation expected while meds adjusted - continue trending BMP daily    GERD - Noted on laryngoscopy with ENT outpatient as well.  Recent evaluation on 09/09/2020 - Has been recommended to be on Pepcid every morning and Dexilant in the late afternoon  Laryngeal candidiasis-resolved as of 09/25/2020 - seen by ENT and treated with diflucan on 04/2020 and 05/2020 for laryngeal candidiasis -Recent evaluation by ENT on 09/09/2020.  No evidence of candidiasis or fungal infection on laryngoscopy; she was recommended to continue Pepcid every morning and Dexilant before 5 PM - Etiology is considered presumed to ongoing immunosuppression, inhalers, and possibly her multiple  recurrent infections   Old records reviewed in assessment of this patient  Antimicrobials: Vanc x 1 Ancef x 1 CTX x 1 Fluconazole 09/22/20 > current  Daptomycin 8/6 >> 09/26/20  DVT prophylaxis: Place TED hose Start: 09/26/20 0824 SCD's Start: 09/22/20 2312 rivaroxaban (XARELTO) tablet 20 mg   Code Status:   Code Status: DNR Family Communication:   Disposition Plan: Status is: Inpatient  Remains inpatient appropriate because:IV treatments appropriate due to intensity of illness or inability to take PO and Inpatient level of care appropriate due to severity of illness  Dispo: The patient is from: Home              Anticipated d/c is to: Home              Patient currently is not medically stable to d/c.   Difficult to place patient No Risk of unplanned readmission score: Unplanned Admission- Pilot do not use: 30.59   Objective: Blood pressure 124/77, pulse 84, temperature 98.4 F (36.9 C), temperature source Oral, resp. rate 20, height 5' 1" (1.549 m), weight 63.5 kg, SpO2 96 %.  Examination: General appearance: alert, cooperative, and no distress Head: Normocephalic, without obvious abnormality, atraumatic Eyes:  EOMI Lungs:  clear to auscultation bilaterally Heart: regular rate and rhythm and S1, S2 normal Abdomen:  soft, obese, NT, ND, BS present Extremities:  RUE wrapped in dressing from fingers to above elbow; no obvious swelling in RUE. Right foot wrapped in dressing with betadine noted on dressings and no obvious swelling or pain in RLE Skin: mobility and turgor normal Neurologic: no paresthesias; no obvious focal deficits  Consultants:  Orthopedic surgery ID Cardiology   Procedures:  Excisional debridement RUE, 09/22/20  Data Reviewed: I have personally reviewed following labs and imaging studies Results for orders placed or performed during the hospital encounter of 09/22/20 (from the past 24 hour(s))  Glucose, capillary     Status: Abnormal   Collection Time:  09/25/20  4:29 PM  Result Value Ref Range   Glucose-Capillary 185 (H) 70 - 99 mg/dL  Glucose, capillary     Status: Abnormal   Collection Time: 09/25/20 11:01 PM  Result Value Ref Range   Glucose-Capillary 230 (H) 70 - 99 mg/dL  Basic metabolic panel     Status: Abnormal   Collection Time: 09/26/20  4:57 AM  Result Value Ref Range   Sodium 138 135 - 145 mmol/L   Potassium 3.5 3.5 - 5.1 mmol/L   Chloride 102 98 - 111 mmol/L   CO2 25 22 - 32 mmol/L   Glucose, Bld 156 (H) 70 - 99 mg/dL   BUN 53 (H) 8 - 23 mg/dL   Creatinine, Ser 1.47 (H) 0.44 - 1.00 mg/dL   Calcium 8.5 (L) 8.9 - 10.3 mg/dL   GFR, Estimated 38 (L) >60 mL/min   Anion gap 11 5 - 15  CBC with Differential/Platelet     Status: Abnormal   Collection Time: 09/26/20  4:57 AM  Result Value Ref Range   WBC 15.8 (H) 4.0 - 10.5 K/uL   RBC 3.35 (L) 3.87 - 5.11 MIL/uL   Hemoglobin 11.0 (L) 12.0 - 15.0 g/dL   HCT 35.8 (L) 36.0 - 46.0 %   MCV 106.9 (H) 80.0 - 100.0 fL   MCH 32.8 26.0 - 34.0 pg   MCHC 30.7 30.0 - 36.0 g/dL   RDW 16.4 (H) 11.5 - 15.5 %   Platelets 238 150 - 400 K/uL   nRBC 0.0 0.0 - 0.2 %   Neutrophils Relative % 76 %   Neutro Abs 11.9 (H) 1.7 - 7.7 K/uL   Lymphocytes Relative 6 %   Lymphs Abs 1.0 0.7 - 4.0 K/uL   Monocytes Relative 15 %   Monocytes Absolute 2.4 (H) 0.1 - 1.0 K/uL   Eosinophils Relative 1 %   Eosinophils Absolute 0.1 0.0 - 0.5 K/uL   Basophils Relative 0 %   Basophils Absolute 0.0 0.0 - 0.1 K/uL   Immature Granulocytes 2 %   Abs Immature Granulocytes 0.32 (H) 0.00 - 0.07 K/uL  Magnesium     Status: None   Collection Time: 09/26/20  4:57 AM  Result Value Ref Range   Magnesium 2.2 1.7 - 2.4 mg/dL  CK     Status: None   Collection Time: 09/26/20  4:57 AM  Result Value Ref Range   Total CK 232 38 - 234 U/L  Glucose, capillary     Status: Abnormal   Collection Time: 09/26/20  8:00 AM  Result Value Ref Range   Glucose-Capillary 113 (H) 70 - 99 mg/dL  Ferritin     Status: None    Collection Time: 09/26/20  8:48 AM  Result Value Ref Range  Ferritin 102 11 - 307 ng/mL  Folate     Status: None   Collection Time: 09/26/20  8:48 AM  Result Value Ref Range   Folate 37.6 >5.9 ng/mL  Iron and TIBC     Status: None   Collection Time: 09/26/20  8:48 AM  Result Value Ref Range   Iron 39 28 - 170 ug/dL   TIBC 367 250 - 450 ug/dL   Saturation Ratios 11 10.4 - 31.8 %   UIBC 328 ug/dL  Vitamin B12     Status: Abnormal   Collection Time: 09/26/20  8:48 AM  Result Value Ref Range   Vitamin B-12 1,486 (H) 180 - 914 pg/mL  Glucose, capillary     Status: Abnormal   Collection Time: 09/26/20 12:29 PM  Result Value Ref Range   Glucose-Capillary 283 (H) 70 - 99 mg/dL   *Note: Due to a large number of results and/or encounters for the requested time period, some results have not been displayed. A complete set of results can be found in Results Review.    Recent Results (from the past 240 hour(s))  Blood Culture (routine x 2)     Status: None (Preliminary result)   Collection Time: 09/22/20  1:03 PM   Specimen: BLOOD  Result Value Ref Range Status   Specimen Description   Final    BLOOD RIGHT ANTECUBITAL Performed at [facility] [address], [place], [place] [address]    Special Requests   Final    BOTTLES DRAWN AEROBIC AND ANAEROBIC Blood Culture adequate volume Performed at [facility] [address] [place], [place] [address]    Culture   Final    NO GROWTH 3 DAYS Performed at [facility], [place] [address] [place], [place] [address]    Report Status PENDING  Incomplete  Resp Panel by RT-PCR (Flu A&B, Covid) Nasopharyngeal Swab     Status: None   Collection Time: 09/22/20  1:04 PM   Specimen: Nasopharyngeal Swab; Nasopharyngeal(NP) swabs in vial transport medium  Result Value Ref Range Status   SARS Coronavirus 2 by RT PCR NEGATIVE NEGATIVE Final    Comment: (NOTE) SARS-CoV-2 target nucleic acids are NOT  DETECTED.  The SARS-CoV-2 RNA is generally detectable in upper respiratory specimens during the acute phase of infection. The lowest concentration of SARS-CoV-2 viral copies this assay can detect is 138 copies/mL. A negative result does not preclude SARS-Cov-2 infection and should not be used as the sole basis for treatment or other patient management decisions. A negative result may occur with  improper specimen collection/handling, submission of specimen other than nasopharyngeal swab, presence of viral mutation(s) within the areas targeted by this assay, and inadequate number of viral copies(<138 copies/mL). A negative result must be combined with clinical observations, patient history, and epidemiological information. The expected result is Negative.  Fact Sheet for Patients:  EntrepreneurPulse.com.au  Fact Sheet for Healthcare Providers:  IncredibleEmployment.be  This test is no t yet approved or cleared by the [place] FDA and  has been authorized for detection and/or diagnosis of SARS-CoV-2 by FDA under an Emergency Use Authorization (EUA). This EUA will remain  in effect (meaning this test can be used) for the duration of the COVID-19 declaration under Section 564(b)(1) of the Act, 21 U.S.C.section 360bbb-3(b)(1), unless the authorization is terminated  or revoked sooner.       Influenza A by PCR NEGATIVE NEGATIVE Final   Influenza B by PCR NEGATIVE NEGATIVE  Final    Comment: (NOTE) The Xpert Xpress SARS-CoV-2/FLU/RSV plus assay is intended as an aid in the diagnosis of influenza from Nasopharyngeal swab specimens and should not be used as a sole basis for treatment. Nasal washings and aspirates are unacceptable for Xpert Xpress SARS-CoV-2/FLU/RSV testing.  Fact Sheet for Patients: EntrepreneurPulse.com.au  Fact Sheet for Healthcare Providers: IncredibleEmployment.be  This test is not yet  approved or cleared by the [place] FDA and has been authorized for detection and/or diagnosis of SARS-CoV-2 by FDA under an Emergency Use Authorization (EUA). This EUA will remain in effect (meaning this test can be used) for the duration of the COVID-19 declaration under Section 564(b)(1) of the Act, 21 U.S.C. section 360bbb-3(b)(1), unless the authorization is terminated or revoked.  Performed at [facility], [place] [address] [place], [place] [address]   Blood Culture (routine x 2)     Status: None (Preliminary result)   Collection Time: 09/22/20  1:08 PM   Specimen: BLOOD  Result Value Ref Range Status   Specimen Description   Final    BLOOD LEFT ANTECUBITAL Performed at [facility] [address] [place], [place] [address]    Special Requests   Final    BOTTLES DRAWN AEROBIC AND ANAEROBIC Blood Culture adequate volume Performed at [place] [address], [place], [place] [address]    Culture   Final    NO GROWTH 3 DAYS Performed at [facility], [place] [address], [place], [place] [address]    Report Status PENDING  Incomplete  Aerobic/Anaerobic Culture w Gram Stain (surgical/deep wound)     Status: None (Preliminary result)   Collection Time: 09/22/20 10:05 PM   Specimen: Synovial, Right Elbow; Tissue  Result Value Ref Range Status   Specimen Description   Final    SYNOVIAL Performed at [facility] [address] [place], [place] [address]    Special Requests   Final    NONE Performed at [facility], [place] [address] [place], [place] [address]    Gram Stain   Final    RARE WBC PRESENT,BOTH PMN AND MONONUCLEAR NO ORGANISMS SEEN    Culture   Final    RARE CANDIDA ALBICANS NO ANAEROBES ISOLATED; CULTURE IN PROGRESS FOR 5 DAYS CRITICAL RESULT CALLED TO, READ BACK BY AND VERIFIED WITH: DR. Mardelle Matte VIA EPIC [phone] FCP Performed at [place], [place] [address] [place], [place] [address]    Report Status PENDING  Incomplete  Fungus Stain     Status: None   Collection Time: 09/22/20 10:05 PM   Specimen: Synovial, Right Elbow; Tissue  Result Value Ref Range Status   FUNGUS STAIN DUPLICATE  Final    Comment: (NOTE) Duplicate procedure ordered.      Refer to [id number] for results. Performed At: [facility] [place], [place] [id number] Rush Farmer MD [id number]    Fungal Source SYNOVIAL  Final    Comment: Performed at [facility], [place] [address] [place], [place] [address]  Acid Fast Smear (AFB)     Status: None   Collection Time: 09/22/20 10:05 PM   Specimen: Synovial, Right Elbow; Tissue  Result Value Ref Range Status   AFB Specimen Processing Comment  Final    Comment: Tissue Grinding and Digestion/Decontamination   Acid Fast Smear Negative  Final    Comment: (NOTE) Performed At: [place],  [place] [id number] Rush Farmer MD VO:[id number]    Source (AFB) HAND  Final    Comment: Performed at [facility], [place] [address], [place], [place] [address]  Aerobic/Anaerobic Culture w Gram Stain (surgical/deep wound)     Status: None (Preliminary result)   Collection Time: 09/22/20 10:05 PM   Specimen: Hand, Right; Body Fluid  Result Value Ref Range Status   Specimen Description   Final    HAND Performed at [facility], [place] [address] [place], [place] [address]    Special Requests   Final    NONE Performed at [facility], [place] [address], [place], [place] [address]    Gram Stain   Final    RARE WBC PRESENT, PREDOMINANTLY PMN NO ORGANISMS SEEN Performed at [facility], [place] [address], [place], [place] [address]    Culture   Final    RARE CANDIDA ALBICANS NO ANAEROBES ISOLATED; CULTURE IN PROGRESS FOR 5 DAYS    Report Status PENDING  Incomplete  Acid Fast Smear  (AFB)     Status: None   Collection Time: 09/22/20 10:05 PM   Specimen: Hand, Right; Body Fluid  Result Value Ref Range Status   AFB Specimen Processing Comment  Final    Comment: Tissue Grinding and Digestion/Decontamination   Acid Fast Smear Negative  Final    Comment: (NOTE) Performed At: [facility] [place], [place] [id number] Rush Farmer MD EL:[id number]    Source (AFB) SYNOVIAL  Final    Comment: Performed at [facility], [place] [address], [place], [place] [address]     Radiology Studies: No results found. DG Chest Port 1 View  Final Result    DG Hand Complete Right  Final Result      Scheduled Meds:  allopurinol  100 mg Oral Daily   arformoterol  15 mcg Nebulization BID   And   umeclidinium bromide  1 puff Inhalation Daily   cyproheptadine  8 mg Oral QPM   docusate sodium  100 mg Oral BID   famotidine  20 mg Oral Daily   fluconazole  200 mg Oral Daily   fluticasone  2 spray Each Nare Daily   folic acid  1 mg Oral Daily   gabapentin  600 mg Oral QHS   guaiFENesin  600 mg Oral BID   insulin aspart  0-15 Units Subcutaneous TID WC   insulin aspart  0-5 Units Subcutaneous QHS   loratadine  10 mg Oral Daily   magnesium oxide  400 mg Oral Daily   mirtazapine  30 mg Oral QHS   montelukast  10 mg Oral Daily   multivitamin with minerals  1 tablet Oral Daily   pantoprazole  40 mg Oral QPM   predniSONE  15 mg Oral Q breakfast   rivaroxaban  20 mg Oral Q supper   sacubitril-valsartan  1 tablet Oral BID   tetrahydrozoline  1 drop Both Eyes BID   PRN Meds: acetaminophen, albuterol, azelastine, bisacodyl, dextromethorphan, diclofenac Sodium, morphine injection, ondansetron **OR** ondansetron (ZOFRAN) IV, oxyCODONE, polyethylene glycol, senna Continuous Infusions:     LOS: 4 days  Time spent: Greater than 50% of the 35 minute visit was spent in counseling/coordination of care for the patient as laid out in the A&P.   Dwyane Dee, MD [facility] 09/26/2020, 2:57 PM

## 2020-09-26 NOTE — Telephone Encounter (Signed)
FYI for JD  [name],   Just letting you know Ebony Scott is currently inpatient at [facility] in room [phone] following a hand and elbow surgery.   She has an appt with your office this Wed and was wondering if she could be seen at the hospital this week instead, or if Dr Erin Fulling could order any labs and tests he needs while she is there?  She won't be able to make her appt Wed, but wanted to let you know what is going on before canceling it.   Thanks so much!   Joseph Art' Joneen Caraway

## 2020-09-26 NOTE — Care Management Important Message (Signed)
Important Message Patient Details IM Letter given to the Patient. Name: Ebony Scott MRN: [id number] Date of Birth: [birth year]   Medicare Important Message Given:  Yes     Kerin Salen 09/26/2020, 1:40 PM

## 2020-09-26 NOTE — Telephone Encounter (Signed)
Ok to order 

## 2020-09-26 NOTE — TOC Initial Note (Signed)
Transition of Care [facility]) - Initial/Assessment Note    Patient Details  Name: Ebony Scott MRN: [id number] Date of Birth: 02-Feb-1951  Transition of Care [facility]) CM/SW Contact:    Murlin Schrieber, Marjie Skiff, RN Phone Number: 09/26/2020, 3:57 PM  Clinical Narrative:                 Received notification from [facility] that ID alerted her of potential need for IV abx at home. Pam to continue to follow along. Per [facility] liaison pt had self referred to them prior to admission to the hospital. Hoyle Sauer from [facility] will follow along as well for home health services.  Expected Discharge Plan: [place] Barriers to Discharge: Continued Medical Work up  Expected Discharge Plan and Services Expected Discharge Plan: [place]   Discharge Planning Services: CM Consult   Living arrangements for the past 2 months: Single Family Home                  Prior Living Arrangements/Services Living arrangements for the past 2 months: Single Family Home Lives with:: Adult Children Patient language and need for interpreter reviewed:: Yes        Need for Family Participation in Patient Care: Yes (Comment) Care giver support system in place?: Yes (comment)   Criminal Activity/Legal Involvement Pertinent to Current Situation/Hospitalization: No - Comment as needed  Activities of Daily Living Home Assistive Devices/Equipment: Walker (specify type), CBG Meter, Nebulizer, Brace (specify type) (4 wheeled walker, left cam boot) ADL Screening (condition at time of admission) Patient's cognitive ability adequate to safely complete daily activities?: Yes Is the patient deaf or have difficulty hearing?: No Does the patient have difficulty seeing, even when wearing glasses/contacts?: No Does the patient have difficulty concentrating, remembering, or making decisions?: No Patient able to express need for assistance with ADLs?: Yes Does the patient have difficulty dressing or  bathing?: Yes Independently performs ADLs?: No Communication: Independent Dressing (OT): Needs assistance Is this a change from baseline?: Change from baseline, expected to last >3 days Grooming: Needs assistance Is this a change from baseline?: Change from baseline, expected to last >3 days Feeding: Needs assistance Is this a change from baseline?: Change from baseline, expected to last >3 days Bathing: Needs assistance Is this a change from baseline?: Change from baseline, expected to last >3 days Toileting: Independent with device (comment) In/Out Bed: Needs assistance Is this a change from baseline?: Change from baseline, expected to last >3 days Walks in Home: Independent with device (comment) Does the patient have difficulty walking or climbing stairs?: Yes (secondary to weakness) Weakness of Legs: Both Weakness of Arms/Hands: Right  Admission diagnosis:  Cellulitis of right hand [L03.113] Cellulitis of right upper extremity [L03.113] Patient Active Problem List   Diagnosis Date Noted   Physical deconditioning 09/25/2020   History of DVT (deep vein thrombosis) 09/24/2020   Osteomyelitis of second toe of right foot ([place]) 09/23/2020   DMII (diabetes mellitus, type 2) ([place]) 09/23/2020   Cellulitis of right hand 09/22/2020   Septic olecranon bursitis of right elbow 09/22/2020   Abscess of dorsum of right hand 0000000   Metabolic encephalopathy [id number]   Acute encephalopathy    AMS (altered mental status) 02/23/2020   S/P reverse total shoulder arthroplasty, right    Hypokalemia    DOE (dyspnea on exertion) 02/18/2020   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   S/P reverse total shoulder arthroplasty, left 02/16/2020   Preoperative clearance  01/26/2020   Closed displaced fracture of metatarsal bone of right foot 01/20/2019   History of pulmonary embolus (PE) 12/19/2018   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   Rheumatoid arthritis ([place])    Bilateral impacted cerumen  08/18/2018   Fungal otitis externa 08/18/2018   Asthmatic bronchitis with exacerbation 07/29/2018   Pulmonary embolism ([place]) 07/29/2018   Cellulitis of forearm    Fever of unknown origin (FUO) 02/10/2018   Abdominal pain 02/10/2018   Fracture of base of fifth metatarsal bone with routine healing, left    Closed fracture of fifth metatarsal bone of right foot, initial encounter 01/15/2018   DVT (deep venous thrombosis) ([place]) 01/14/2018   Hypothyroidism 01/14/2018   Depression 01/14/2018   Debility    Malnutrition of moderate degree 12/17/2017   Knee pain    History of breast cancer    Chronic pain syndrome    Fibromyalgia    Graves disease    Sepsis due to undetermined organism ([place]) 12/13/2017   Rhabdomyolysis 12/13/2017   Dysphonia 08/10/2017   Pulmonary nodules    Chronic kidney disease, stage 3b ([place]) 04/15/2016   Hx of adenomatous colonic polyps 04/12/2016   Cough 02/09/2016   Opacity of lung on imaging study 11/03/2015   Severe persistent asthma 02/08/2015   Facial pain syndrome 02/08/2015   Insomnia 02/08/2015   Other allergic rhinitis 02/08/2015   LPRD (laryngopharyngeal reflux disease) 02/08/2015   Chronic combined systolic and diastolic CHF (congestive heart failure) ([place]) 02/08/2015   Benign paroxysmal positional vertigo 12/03/2012   Dizziness and giddiness 10/29/2012   Disequilibrium 10/29/2012   Pseudogout 02/24/2012   Pneumonia 05/31/2011   Irritable bowel syndrome 01/02/2010   Hyperlipidemia 12/23/2009   Hyperthyroidism 11/12/2006   Seasonal and perennial allergic rhinitis 11/12/2006   GERD 11/12/2006   NECK PAIN, CHRONIC 11/12/2006   OSTEOPOROSIS 11/12/2006   BREAST CANCER, HX OF 11/12/2006   PCP:  [name], MD Pharmacy:   [facility], [place] - [address] [place] [address] Phone: [phone] Fax: [phone]  [place], [place] [address] Phone: [phone] Fax: [phone]     Social Determinants of Health (SDOH) Interventions    Readmission Risk Interventions Readmission Risk Prevention Plan 09/26/2020  Transportation Screening Complete  Medication Review ([place]) Complete  PCP or Specialist appointment within 3-5 days of discharge Complete  HRI or [place] Complete  SW Recovery Care/Counseling Consult Complete  [place] Not Applicable  Some recent data might be hidden

## 2020-09-26 NOTE — Evaluation (Addendum)
Physical Therapy Evaluation Patient Details Name: Ebony Scott MRN: [id number] DOB: [birth year] Today's Date: 09/26/2020   History of Present Illness  Ebony Scott is a 70 y.o. 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 Presents 09/22/20  with right hand swelling and pain.S/P multiple I&Ds of the right gangluion cyst. Patient s/p Excisional debridement, right olecranon bursa with olecranon bursectomy and irrigation and debridement and excisional debridement, right dorsal rheumatoid nodule, overlying the long metacarpal phalangeal joint.  Clinical Impression  The patient reports having a bad day, RUE pain. Patient assisted from recliner to and from [facility] with min assistance, patient reliant on LUE support throughout transfers. Patient reports no feeling up to ambulation  this visit. Patient reports that daughter works nights and that she(patient) is alone and is seeking some  extra assistance.   Pt admitted with above diagnosis.  Pt currently with functional limitations due to the deficits listed below (see PT Problem List). Pt will benefit from skilled PT to increase their independence and safety with mobility to allow discharge to the venue listed below.       Follow Up Recommendations Home health PT, 24/7, vs SNF    Equipment Recommendations  None recommended by PT    Recommendations for Other Services       Precautions / Restrictions Precautions Precautions: Fall Required Braces or Orthoses: Splint/Cast Splint/Cast: long posterior splint RUE,  surgical shoe on Right, has misplaced left house shoe since admission  to balance out Restrictions Weight Bearing Restrictions: (P)No RUE Weight Bearing: Weight bearing as tolerated  Right foot-WBAT in surgical shoe    Mobility  Bed Mobility               General bed mobility comments: in recliner    Transfers Overall  transfer level: Needs assistance   Transfers: Sit to/from Stand;Stand Pivot Transfers Sit to Stand: Min assist Stand pivot transfers: Min guard;Min assist       General transfer comment: stands with support of LUE on armrest and BSC arm to transfer,  stays flexed foward  for transfer to [facility] and back to recliner. Did not have surgical shoe in place since her left shoe is missing  Ambulation/Gait                Stairs            Wheelchair Mobility    Modified Rankin (Stroke Patients Only)       Balance Overall balance assessment: Needs assistance Sitting-balance support: No upper extremity supported Sitting balance-Leahy Scale: Fair     Standing balance support: During functional activity;Single extremity supported Standing balance-Leahy Scale: Fair Standing balance comment: supports with LUE                             Pertinent Vitals/Pain Faces Pain Scale: Hurts little more Pain Location: R arm Pain Descriptors / Indicators: Discomfort Pain Intervention(s): Monitored during session    Home Living Family/patient expects to be discharged to:: Private residence Living Arrangements: Children Available Help at Discharge: Family;Available PRN/intermittently Type of Home: House Home Access: Stairs to enter Entrance Stairs-Rails: Left Entrance Stairs-Number of Steps: family does not let her use inside steps- stays on main floor once inside Home Layout: Two level;Other (Comment);Able to live on main level with bedroom/bathroom Home Equipment: Toilet riser;Shower seat;Cane - single point Additional Comments: Daughter is an Therapist, sports that works night  shift.    Prior Function Level of Independence: Independent with assistive device(s)         Comments: enjoys Gospel music; crossword puzzles     Hand Dominance   Dominant Hand: Right    Extremity/Trunk Assessment   Upper Extremity Assessment RUE Deficits / Details: able to wiggle fingers,  functional shoulder ROM, Long arm posterior splint    Lower Extremity Assessment Lower Extremity Assessment: Generalized weakness    Cervical / Trunk Assessment Cervical / Trunk Assessment: Kyphotic  Communication   Communication: No difficulties  Cognition Arousal/Alertness: Awake/alert Behavior During Therapy: WFL for tasks assessed/performed Overall Cognitive Status: Within Functional Limits for tasks assessed                                        General Comments      Exercises     Assessment/Plan    PT Assessment Patient needs continued PT services  PT Problem List Decreased strength;Decreased balance;Decreased knowledge of precautions;Decreased mobility;Decreased knowledge of use of DME;Decreased activity tolerance;Decreased safety awareness;Decreased skin integrity       PT Treatment Interventions DME instruction;Therapeutic activities;Gait training;Therapeutic exercise;Patient/family education;Stair training;Functional mobility training    PT Goals (Current goals can be found in the Care Plan section)  Acute Rehab PT Goals Patient Stated Goal: go home with some help PT Goal Formulation: With patient Time For Goal Achievement: 10/10/20 Potential to Achieve Goals: Good    Frequency Min 3X/week   Barriers to discharge        Co-evaluation               AM-PAC PT "6 Clicks" Mobility  Outcome Measure Help needed turning from your back to your side while in a flat bed without using bedrails?: A Little Help needed moving from lying on your back to sitting on the side of a flat bed without using bedrails?: A Little Help needed moving to and from a bed to a chair (including a wheelchair)?: A Little Help needed standing up from a chair using your arms (e.g., wheelchair or bedside chair)?: A Little Help needed to walk in hospital room?: A Little Help needed climbing 3-5 steps with a railing? : A Lot 6 Click Score: 17    End of Session    Activity Tolerance: Patient limited by fatigue Patient left: in chair;with call bell/phone within reach;with chair alarm set;with family/visitor present Nurse Communication: Mobility status PT Visit Diagnosis: Unsteadiness on feet (R26.81);Difficulty in walking, not elsewhere classified (R26.2);Pain Pain - Right/Left: Right Pain - part of body: Arm    Time: [id number] PT Time Calculation (min) (ACUTE ONLY): 27 min   Charges:   PT Evaluation $PT Eval Low Complexity: 1 Low PT Treatments $Therapeutic Activity: 8-22 mins        Tresa Endo PT Acute Rehabilitation Services Pager [phone] Office [phone]   Claretha Cooper 09/26/2020, 4:02 PM

## 2020-09-26 NOTE — Progress Notes (Signed)
[facility] for Infectious Disease  Date of Admission:  09/22/2020           Reason for visit: Follow up on Septic olecranon bursitis, abscess of right hand  Current antibiotics: Daptomycin 8/5-pres Fluconazole 8/5-pres   ASSESSMENT:    Septic olecranon bursitis: Cultures this admission from the OR with Candida albicans.  She previously had completed a 3-week course of fluconazole in June 2022 after aspiration cultures grew the same.  This admission, however, she has undergone excisional debridement and bursectomy with I&D on 09/22/2020. Rheumatoid nodule/abscess of the right hand: Cultures similarly with Candida albicans.  She is status post excisional debridement of the dorsal rheumatoid nodule and I&D of the third MCP on 09/22/2020. Diabetes: A1c 8.4 History of laryngeal candidiasis: Seen as recently as July 2022 by ENT.  At that time there was no evidence of fungal infection on laryngoscope Rheumatoid arthritis CKD: Creatinine today is stable at 1.4 Chronic CHF with newly reduced EF: Being followed by cardiology  PLAN:    Continue fluconazole dosed by pharmacy Will attempt to obtain Candida susceptibilities DC daptomycin based on cultures growing yeast Wound care, glycemic control Hopeful to avoid PICC line with her since there did not appear to be any bone or joint involvement and presumably had good control of her infection with debridement Will follow   Principal Problem:   Septic olecranon bursitis of right elbow Active Problems:   Hyperlipidemia   GERD   Chronic combined systolic and diastolic CHF (congestive heart failure) (HCC)   Chronic kidney disease, stage 3b (HCC)   Pulmonary nodules   Hypothyroidism   Rheumatoid arthritis ([place])   Tracheobronchomalacia   Cellulitis of right hand   Abscess of dorsum of right hand   Osteomyelitis of second toe of right foot ([place])   DMII (diabetes mellitus, type 2) ([place])   History of DVT (deep vein thrombosis)    Physical deconditioning    MEDICATIONS:    Scheduled Meds:  allopurinol  100 mg Oral Daily   arformoterol  15 mcg Nebulization BID   And   umeclidinium bromide  1 puff Inhalation Daily   cyproheptadine  8 mg Oral QPM   dapagliflozin propanediol  10 mg Oral Daily   docusate sodium  100 mg Oral BID   famotidine  20 mg Oral Daily   fluconazole  200 mg Oral Daily   fluticasone  2 spray Each Nare Daily   folic acid  1 mg Oral Daily   gabapentin  600 mg Oral QHS   guaiFENesin  600 mg Oral BID   insulin aspart  0-15 Units Subcutaneous TID WC   insulin aspart  0-5 Units Subcutaneous QHS   loratadine  10 mg Oral Daily   magnesium oxide  400 mg Oral Daily   mirtazapine  30 mg Oral QHS   montelukast  10 mg Oral Daily   multivitamin with minerals  1 tablet Oral Daily   pantoprazole  40 mg Oral QPM   predniSONE  15 mg Oral Q breakfast   rivaroxaban  20 mg Oral Q supper   sacubitril-valsartan  1 tablet Oral BID   tetrahydrozoline  1 drop Both Eyes BID   Continuous Infusions: PRN Meds:.acetaminophen, albuterol, azelastine, bisacodyl, dextromethorphan, diclofenac Sodium, morphine injection, ondansetron **OR** ondansetron (ZOFRAN) IV, oxyCODONE, polyethylene glycol, senna  SUBJECTIVE:   24 hour events:  No acute events were noted overnight Afebrile T-max 98.5 WBC stable 15.8 Creatinine stable 1.47 with creatinine clearance 29.5  Cultures from her hand with Candida albicans.  Cultures from her right elbow with Candida albicans as well  She reports some pain postoperatively but stable.  She is asking for Delsym cough syrup.  She has no fevers or chills.  She is also asking for TED hose for her lower extremity edema.   Review of Systems  All other systems reviewed and are negative.    OBJECTIVE:   Blood pressure (!) 146/81, pulse 94, temperature 98.3 F (36.8 C), temperature source Oral, resp. rate 18, height '5\' 1"'$  (1.549 m), weight 63.5 kg, SpO2 95 %. Body mass index is 26.45  kg/m.  Physical Exam Constitutional:      General: She is not in acute distress.    Appearance: Normal appearance.  HENT:     Head: Normocephalic and atraumatic.  Pulmonary:     Effort: Pulmonary effort is normal. No respiratory distress.     Comments: Coughing intermittently. Abdominal:     General: Abdomen is flat. There is no distension.     Palpations: Abdomen is soft.     Tenderness: There is no abdominal tenderness.  Musculoskeletal:     Comments: Right elbow and hand wrapped in Ace bandage. Bilateral thumb nails with midline cracking. No evidence of onychomycosis   Skin:    General: Skin is warm and dry.     Findings: No rash.  Neurological:     General: No focal deficit present.     Mental Status: She is alert and oriented to person, place, and time.  Psychiatric:        Mood and Affect: Mood normal.        Behavior: Behavior normal.     Lab Results: Lab Results  Component Value Date   WBC 15.8 (H) 09/26/2020   HGB 11.0 (L) 09/26/2020   HCT 35.8 (L) 09/26/2020   MCV 106.9 (H) 09/26/2020   PLT 238 09/26/2020    Lab Results  Component Value Date   NA 138 09/26/2020   K 3.5 09/26/2020   CO2 25 09/26/2020   GLUCOSE 156 (H) 09/26/2020   BUN 53 (H) 09/26/2020   CREATININE 1.47 (H) 09/26/2020   CALCIUM 8.5 (L) 09/26/2020   GFRNONAA 38 (L) 09/26/2020   GFRAA 40 (L) 08/25/2020    Lab Results  Component Value Date   ALT 60 (H) 09/23/2020   AST 62 (H) 09/23/2020   ALKPHOS 75 09/23/2020   BILITOT 1.9 (H) 09/23/2020       Component Value Date/Time   CRP 17.1 (H) 03/08/2018 0310       Component Value Date/Time   ESRSEDRATE 41 (H) 07/29/2018 1014     I have reviewed the micro and lab results in Epic.  Imaging: No results found.   Imaging independently reviewed in Epic.    Raynelle Highland for Infectious Disease [facility] [phone] pager 09/26/2020, 11:47 AM  I spent greater than 35 minutes with the patient  including greater than 50% of time in face to face counsel of the patient and in coordination of their care.

## 2020-09-26 NOTE — Anesthesia Postprocedure Evaluation (Signed)
Anesthesia Post Note  Patient: Ebony Scott  Procedure(s) Performed: IRRIGATION AND DEBRIDEMENT RIGHT HAND AND ELBOW (Right: Arm Lower)     Patient location during evaluation: PACU Anesthesia Type: General Level of consciousness: awake and alert Pain management: pain level controlled Vital Signs Assessment: post-procedure vital signs reviewed and stable Respiratory status: spontaneous breathing, nonlabored ventilation, respiratory function stable and patient connected to nasal cannula oxygen Cardiovascular status: blood pressure returned to baseline and stable Postop Assessment: no apparent nausea or vomiting Anesthetic complications: no   No notable events documented.  Last Vitals:  Vitals:   09/26/20 0708 09/26/20 0800  BP: (!) 146/81   Pulse: 94   Resp: 18   Temp: 36.8 C   SpO2: 96% 95%    Last Pain:  Vitals:   09/26/20 0708  TempSrc: Oral  PainSc:                  Merlinda Frederick

## 2020-09-26 NOTE — Progress Notes (Addendum)
Progress Note  Patient Name: Ebony Scott Date of Encounter: 09/26/2020  [facility] Cardiologist: Buford Dresser, MD   Subjective   No acute overnight events. Patient reports feeling very tired today. No chest pain. She does reports shortness of breath both with laying down and with minimal activity that has been going on since she has been here. No real improvement. She does states she is able to lay flat at night.  Inpatient Medications    Scheduled Meds:  allopurinol  100 mg Oral Daily   arformoterol  15 mcg Nebulization BID   And   umeclidinium bromide  1 puff Inhalation Daily   cyproheptadine  8 mg Oral QPM   dapagliflozin propanediol  10 mg Oral Daily   docusate sodium  100 mg Oral BID   famotidine  20 mg Oral Daily   fluconazole  200 mg Oral Daily   fluticasone  2 spray Each Nare Daily   folic acid  1 mg Oral Daily   gabapentin  600 mg Oral QHS   guaiFENesin  600 mg Oral BID   insulin aspart  0-15 Units Subcutaneous TID WC   insulin aspart  0-5 Units Subcutaneous QHS   loratadine  10 mg Oral Daily   magnesium oxide  400 mg Oral Daily   mirtazapine  30 mg Oral QHS   montelukast  10 mg Oral Daily   multivitamin with minerals  1 tablet Oral Daily   pantoprazole  40 mg Oral QPM   predniSONE  15 mg Oral Q breakfast   rivaroxaban  20 mg Oral Q supper   sacubitril-valsartan  1 tablet Oral BID   tetrahydrozoline  1 drop Both Eyes BID   Continuous Infusions:  PRN Meds: acetaminophen, albuterol, azelastine, bisacodyl, dextromethorphan, diclofenac Sodium, morphine injection, ondansetron **OR** ondansetron (ZOFRAN) IV, oxyCODONE, polyethylene glycol, senna   Vital Signs    Vitals:   09/25/20 2027 09/25/20 2246 09/26/20 0708 09/26/20 0800  BP:  110/70 (!) 146/81   Pulse:  92 94   Resp:  18 18   Temp:  98.5 F (36.9 C) 98.3 F (36.8 C)   TempSrc:  Oral Oral   SpO2: 99% 97% 96% 95%  Weight:      Height:        Intake/Output Summary (Last 24 hours) at  09/26/2020 1046 Last data filed at 09/26/2020 1012 Gross per 24 hour  Intake 1200 ml  Output 2401 ml  Net -1201 ml   Last 3 Weights 09/22/2020 09/06/2020 08/30/2020  Weight (lbs) 140 lb 143 lb 144 lb  Weight (kg) 63.504 kg 64.864 kg 65.318 kg      Telemetry    Normal sinus rhythm with rates in the 80s to 90s. - Personally Reviewed  ECG    No new ECG tracing today.- Personally Reviewed  Physical Exam   GEN: No acute distress.   Neck: No JVD. Cardiac: RRR. No murmurs, rubs, or gallops.  Respiratory: No increased work of breathing. Faint rhonchi noted in bilateral bases. No significant crackles or wheezes noted. GI: Soft, non-distended, and non-tender. MS: No lower extremity edema. Skin: Warm and dry. Neuro:  No focal deficits. Psych: Normal affect. Responds appropriately.  Labs    High Sensitivity Troponin:  No results for input(s): TROPONINIHS in the last 720 hours.    Chemistry Recent Labs  Lab 09/22/20 1302 09/22/20 2352 09/23/20 0010 09/24/20 0618 09/25/20 0545 09/26/20 0457  NA 138  --  136 137 139 138  K 3.9  --  5.3* 4.2 4.0 3.5  CL 98  --  98 102 105 102  CO2 27  --  '25 26 26 25  '$ GLUCOSE 157*  --  175* 223* 170* 156*  BUN 29*  --  28* 41* 42* 53*  CREATININE 1.59*   < > 1.59* 1.47* 1.31* 1.47*  CALCIUM 10.0  --  9.3 8.4* 8.5* 8.5*  PROT 6.5  --  6.2*  --   --   --   ALBUMIN 3.3*  --  3.0*  --   --   --   AST 53*  --  62*  --   --   --   ALT 72*  --  60*  --   --   --   ALKPHOS 82  --  75  --   --   --   BILITOT 1.1  --  1.9*  --   --   --   GFRNONAA 34*   < > 34* 38* 43* 38*  ANIONGAP 13  --  '13 9 8 11   '$ < > = values in this interval not displayed.     Hematology Recent Labs  Lab 09/24/20 0618 09/25/20 0545 09/26/20 0457  WBC 17.2* 18.6* 15.8*  RBC 3.24* 3.41* 3.35*  HGB 10.7* 11.3* 11.0*  HCT 34.5* 36.1 35.8*  MCV 106.5* 105.9* 106.9*  MCH 33.0 33.1 32.8  MCHC 31.0 31.3 30.7  RDW 16.4* 16.4* 16.4*  PLT 224 246 238    BNP Recent Labs   Lab 09/22/20 1426  BNP 424.3*     DDimer No results for input(s): DDIMER in the last 168 hours.   Radiology    No results found.  Cardiac Studies   Echocardiogram 09/16/2020:  1. Septal apical and anterior wall hypokinesis EF decreased compard to  TTE done 07/10/19 . Left ventricular ejection fraction, by estimation, is  35 to 40%. The left ventricle has moderately decreased function. The left  ventricle demonstrates regional  wall motion abnormalities (see scoring diagram/findings for description).  The left ventricular internal cavity size was moderately dilated. Left  ventricular diastolic parameters are consistent with Grade II diastolic  dysfunction (pseudonormalization).  Elevated left ventricular end-diastolic pressure.   2. Right ventricular systolic function is normal. The right ventricular  size is normal.   3. Left atrial size was mildly dilated.   4. The mitral valve is abnormal. Mild mitral valve regurgitation. No  evidence of mitral stenosis.   5. The aortic valve is tricuspid. There is moderate calcification of the  aortic valve. Aortic valve regurgitation is not visualized. Mild to  moderate aortic valve sclerosis/calcification is present, without any  evidence of aortic stenosis.   6. The inferior vena cava is normal in size with greater than 50%  respiratory variability, suggesting right atrial pressure of 3 mmHg.  Patient Profile     70 y.o. 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 Patient was admitted for septic right elbow olecranon bursitis and cellulitis of right hand. S/p I&D on 09/22/2020. Cardiology consulted for further evaluation of recent outpatient Echo which showed newly reduced EF of 35-40% with new wall motion  abnormalities.  Assessment & Plan    Chronic Heart Failure with Newly Reduced EF - Echo on 09/16/2020 showed LVEF of 35-40% with septal apical and anterior  wall hypokinesis which are new. EF previously >75% in 2021. - BNP elevated at 424. - Chest x-ray showed no acute findings. - Initially on IV Lasix but this was stopped today. Net negative 1.1 L this admission. No updated weights. Renal function stable. - No significant volume overloaded on exam. Will discuss whether patient need daily PO dosing or just PRN dosing with MD. - Started on Entresto 24-'26mg'$  twice daily. Consider increasing to 49-'51mg'$  twice daily. - Has not been started on beta-blocker given respiratory issues. Most recent PFTs last year showed low FEV1 but elevated FEV1/FVC suggesting an interstitial process. Could consider trying Bisoprolol.  - Started on Farxiga '10mg'$  daily. Continue. - Patient will ultimately need an ischemic evaluation  Prior chest CT showed diffuse coronary calcifications. Can likely plan for outpatient ischemic evaluation once she recovers from acute illness.   Hypertension - BP mildly elevated this morning but has been mostly well controlled over the last 24 hours. - Continue medications for CHF as above.   Hyperlipidemia - Lipid panel this admission: Total Cholesterol 318, Triglycerides 475, HDL 38. LDL could not be calculated due to Triglyceride level. Direct LDL pending. Last LDL I can see in our system was 132 in 2018. - LDL goal <70 given CAD. - On Lipitor '10mg'$  daily at home. Will go ahead and increase to '80mg'$  given LDL in the past and degree of coronary calcifications.   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   CKD Stage III - Creatinien 1.47 which is around patient's baseline. - Continue to monior.    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  I have reviewed the chart, notes and new data.  I agree with PA/NP's note.  Key new complaints: never had angina, still dyspneic at rest Key examination changes: no rales, no JVD, no S3 and no edema Key new findings / data: echo reviewed - there is severe hypokinesis in the entire LAD artery distribution.  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 She has reasonably good QRS voltage in the anterior leads, although cannot exclude old septal infarction. There is likely to be a substantial amount of ischemic/stunned/hibernating myocardium and she would benefit from revascularization. However, invasive procedures (and especially anticipated CABG) need to be deferred as long as she has an active fungal infection. For the same reason, avoid SGLT2 inhibitors, at least for now (D/W Dr. Margaretann Loveless). Titrate Entresto. If BP allows, start very low dose bisoprolol in next couple of days.  Sanda Klein, MD, [place] [phone] 09/26/2020, 1:13 PM

## 2020-09-26 NOTE — Progress Notes (Signed)
Pt refused CPAP qhs.  Pt states she will wear her 2Lpm nasal cannula instead while sleeping.  Pt encouraged to contact RT should she change her mind.

## 2020-09-26 NOTE — Telephone Encounter (Signed)
Received call from [facility] with [facility]; called to ask if provider will put in an order for a home health nurse. Please advise at [phone] when order is in and she will pull it from the database.

## 2020-09-26 NOTE — Progress Notes (Addendum)
Subjective: 4 Days Post-Op s/p Procedure(s): IRRIGATION AND DEBRIDEMENT RIGHT HAND AND ELBOW   Patient is alert, oriented. Patient reports pain as mild this morning, though she has had some intermittent severe pain at the right elbow.  Denies chest pain or calf pain. No nausea/vomiting. She would like to have ted hose and voltaren gel for her other painful joints (bilateral knees and multiple areas of left hand)  Objective:  PE: VITALS:   Vitals:   09/25/20 2027 09/25/20 2246 09/26/20 0708 09/26/20 0800  BP:  110/70 (!) 146/81   Pulse:  92 94   Resp:  18 18   Temp:  98.5 F (36.9 C) 98.3 F (36.8 C)   TempSrc:  Oral Oral   SpO2: 99% 97% 96% 95%  Weight:      Height:       MSK: RUE in posterior splint, packing intact. No increase in erythema or edema. Bursa look to be well compressed. Able to flex and extend all fingers of right hand. Distal sensation and capillary refill intact. 5-100 deg ROM of right elbow.     LABS  Results for orders placed or performed during the hospital encounter of 09/22/20 (from the past 24 hour(s))  Glucose, capillary     Status: Abnormal   Collection Time: 09/25/20 12:16 PM  Result Value Ref Range   Glucose-Capillary 291 (H) 70 - 99 mg/dL  Glucose, capillary     Status: Abnormal   Collection Time: 09/25/20  4:29 PM  Result Value Ref Range   Glucose-Capillary 185 (H) 70 - 99 mg/dL  Glucose, capillary     Status: Abnormal   Collection Time: 09/25/20 11:01 PM  Result Value Ref Range   Glucose-Capillary 230 (H) 70 - 99 mg/dL  Basic metabolic panel     Status: Abnormal   Collection Time: 09/26/20  4:57 AM  Result Value Ref Range   Sodium 138 135 - 145 mmol/L   Potassium 3.5 3.5 - 5.1 mmol/L   Chloride 102 98 - 111 mmol/L   CO2 25 22 - 32 mmol/L   Glucose, Bld 156 (H) 70 - 99 mg/dL   BUN 53 (H) 8 - 23 mg/dL   Creatinine, Ser 1.47 (H) 0.44 - 1.00 mg/dL   Calcium 8.5 (L) 8.9 - 10.3 mg/dL   GFR, Estimated 38 (L) >60 mL/min   Anion gap 11  5 - 15  CBC with Differential/Platelet     Status: Abnormal   Collection Time: 09/26/20  4:57 AM  Result Value Ref Range   WBC 15.8 (H) 4.0 - 10.5 K/uL   RBC 3.35 (L) 3.87 - 5.11 MIL/uL   Hemoglobin 11.0 (L) 12.0 - 15.0 g/dL   HCT 35.8 (L) 36.0 - 46.0 %   MCV 106.9 (H) 80.0 - 100.0 fL   MCH 32.8 26.0 - 34.0 pg   MCHC 30.7 30.0 - 36.0 g/dL   RDW 16.4 (H) 11.5 - 15.5 %   Platelets 238 150 - 400 K/uL   nRBC 0.0 0.0 - 0.2 %   Neutrophils Relative % 76 %   Neutro Abs 11.9 (H) 1.7 - 7.7 K/uL   Lymphocytes Relative 6 %   Lymphs Abs 1.0 0.7 - 4.0 K/uL   Monocytes Relative 15 %   Monocytes Absolute 2.4 (H) 0.1 - 1.0 K/uL   Eosinophils Relative 1 %   Eosinophils Absolute 0.1 0.0 - 0.5 K/uL   Basophils Relative 0 %   Basophils Absolute 0.0 0.0 - 0.1 K/uL  Immature Granulocytes 2 %   Abs Immature Granulocytes 0.32 (H) 0.00 - 0.07 K/uL  Magnesium     Status: None   Collection Time: 09/26/20  4:57 AM  Result Value Ref Range   Magnesium 2.2 1.7 - 2.4 mg/dL  CK     Status: None   Collection Time: 09/26/20  4:57 AM  Result Value Ref Range   Total CK 232 38 - 234 U/L  Glucose, capillary     Status: Abnormal   Collection Time: 09/26/20  8:00 AM  Result Value Ref Range   Glucose-Capillary 113 (H) 70 - 99 mg/dL   *Note: Due to a large number of results and/or encounters for the requested time period, some results have not been displayed. A complete set of results can be found in Results Review.    No results found.  Assessment/Plan: Septic right elbow olecranon bursitis Right dorsal infected rheumatoid nodule, hand, overlying the third metacarpal phalangeal joint. - s/p right olecranon bursae and MCP nodule irrigation and debridement 4 Days Post-Op s/p Procedure(s): IRRIGATION AND DEBRIDEMENT RIGHT HAND AND ELBOW - cultures growing candida albicans, ID following, confirmed with lab that they will be sending off for sensitivities Weightbearing: WBAT RUE Insicional and dressing care:  leave dressing intact, will plan to change tomorrow and remove packing VTE prophylaxis: Home xarelto Pain control: continue current regimen Follow - up plan: will continue to follow, 1 week after discharge Dispo:pending, PT ordered today.   Contact information:   Weekdays 8-5 [address], [place] [phone] A fter hours and holidays please check [email or web address] for group call information for [facility] [place] 09/26/2020, 8:41 AM

## 2020-09-27 DIAGNOSIS — M71121 Other infective bursitis, right elbow: Secondary | ICD-10-CM | POA: Diagnosis not present

## 2020-09-27 DIAGNOSIS — E1169 Type 2 diabetes mellitus with other specified complication: Secondary | ICD-10-CM | POA: Diagnosis not present

## 2020-09-27 DIAGNOSIS — I5042 Chronic combined systolic (congestive) and diastolic (congestive) heart failure: Secondary | ICD-10-CM | POA: Diagnosis not present

## 2020-09-27 DIAGNOSIS — N1832 Chronic kidney disease, stage 3b: Secondary | ICD-10-CM | POA: Diagnosis not present

## 2020-09-27 LAB — CBC WITH DIFFERENTIAL/PLATELET
Abs Immature Granulocytes: 0.39 10*3/uL — ABNORMAL HIGH (ref 0.00–0.07)
Basophils Absolute: 0.1 10*3/uL (ref 0.0–0.1)
Basophils Relative: 0 %
Eosinophils Absolute: 0.4 10*3/uL (ref 0.0–0.5)
Eosinophils Relative: 3 %
HCT: 36.6 % (ref 36.0–46.0)
Hemoglobin: 11.3 g/dL — ABNORMAL LOW (ref 12.0–15.0)
Immature Granulocytes: 2 %
Lymphocytes Relative: 7 %
Lymphs Abs: 1.2 10*3/uL (ref 0.7–4.0)
MCH: 33.3 pg (ref 26.0–34.0)
MCHC: 30.9 g/dL (ref 30.0–36.0)
MCV: 108 fL — ABNORMAL HIGH (ref 80.0–100.0)
Monocytes Absolute: 2.3 10*3/uL — ABNORMAL HIGH (ref 0.1–1.0)
Monocytes Relative: 14 %
Neutro Abs: 12.3 10*3/uL — ABNORMAL HIGH (ref 1.7–7.7)
Neutrophils Relative %: 74 %
Platelets: 266 10*3/uL (ref 150–400)
RBC: 3.39 MIL/uL — ABNORMAL LOW (ref 3.87–5.11)
RDW: 16.7 % — ABNORMAL HIGH (ref 11.5–15.5)
WBC: 16.7 10*3/uL — ABNORMAL HIGH (ref 4.0–10.5)
nRBC: 0 % (ref 0.0–0.2)

## 2020-09-27 LAB — BASIC METABOLIC PANEL
Anion gap: 10 (ref 5–15)
BUN: 48 mg/dL — ABNORMAL HIGH (ref 8–23)
CO2: 24 mmol/L (ref 22–32)
Calcium: 8.7 mg/dL — ABNORMAL LOW (ref 8.9–10.3)
Chloride: 105 mmol/L (ref 98–111)
Creatinine, Ser: 1.35 mg/dL — ABNORMAL HIGH (ref 0.44–1.00)
GFR, Estimated: 42 mL/min — ABNORMAL LOW (ref >60)
Glucose, Bld: 108 mg/dL — ABNORMAL HIGH (ref 70–99)
Potassium: 4.3 mmol/L (ref 3.5–5.1)
Sodium: 139 mmol/L (ref 135–145)

## 2020-09-27 LAB — MAGNESIUM: Magnesium: 2.4 mg/dL (ref 1.7–2.4)

## 2020-09-27 LAB — CULTURE, BLOOD (ROUTINE X 2)
Culture: NO GROWTH
Culture: NO GROWTH
Special Requests: ADEQUATE
Special Requests: ADEQUATE

## 2020-09-27 LAB — GLUCOSE, CAPILLARY
Glucose-Capillary: 118 mg/dL — ABNORMAL HIGH (ref 70–99)
Glucose-Capillary: 205 mg/dL — ABNORMAL HIGH (ref 70–99)
Glucose-Capillary: 250 mg/dL — ABNORMAL HIGH (ref 70–99)

## 2020-09-27 MED ORDER — TRAMADOL HCL 50 MG PO TABS
50.0000 mg | ORAL_TABLET | Freq: Every evening | ORAL | Status: DC | PRN
  Administered 2020-09-27: 50 mg via ORAL
  Filled 2020-09-27: qty 1

## 2020-09-27 NOTE — Progress Notes (Signed)
Physical Therapy Treatment Patient Details Name: Ebony Scott MRN: [id number] DOB: 1950/07/28 Today's Date: 09/27/2020    History of Present Illness Ebony Scott is a 70 y.o. female with medical history significant of ashtma, combined systolic/diastolic HF, tracheobronchomalacia,  Right second digit internal amputation 6/. 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 Patient s/p Excisional debridement, right olecranon bursa with olecranon bursectomy and irrigation and debridement and excisional debridement, right dorsal rheumatoid nodule, overlying the long metacarpal phalangeal joint.    PT Comments    Patient remains very limited by fatigue and pain in Rt UE. Pt required Min assist to steady and complete rise from recliner/BSC. Pt very unsteady with attempting steps to ambulate to bathroom. Pt sat back in recliner 2x and then requested BSC be brought to her rather than attempt ambulation to bathroom. Pt required Mod Assist with HHA or RW to move to [facility] safely. Cues needed to sequence all transfers and encouragement to initiate. Discussed plans for discharge with pt and concerns regarding current mobility level and returning home. Educated on benefits of follow up therapy at SNF setting to improve strength/balance/independence with mobility to improve safety prior to return home. Pt agreeable to SNF rehab now, acute PT will progress as able.    Follow Up Recommendations  SNF;Supervision/Assistance - 24 hour     Equipment Recommendations  None recommended by PT    Recommendations for Other Services       Precautions / Restrictions Precautions Precautions: Fall Required Braces or Orthoses: Splint/Cast Splint/Cast: long posterior splint RUE, surgical shoe on Right, has misplaced left house shoe since admission  to balance out (RN brought shoes for pt to use) Restrictions Weight Bearing Restrictions: No RUE Weight Bearing: Weight  bearing as tolerated    Mobility  Bed Mobility Overal bed mobility: Needs Assistance Bed Mobility: Supine to Sit     Supine to sit: Min assist;HOB elevated     General bed mobility comments: pt up in recliner    Transfers Overall transfer level: Needs assistance Equipment used: Rolling walker (2 wheeled);1 person hand held assist Transfers: Sit to/from [facility] Sit to Stand: Min assist Stand pivot transfers: Mod assist       General transfer comment: pt requried min assist to rise from recliner and steady. cues to use Lt UE for power up. Pt very unsteady with stepping to move chair<>BSC. Mod assist to prevent LOB with Lt UE HHA and also with use of RW. Pt was unable to advance to gait due to fatigue.  Ambulation/Gait                 Stairs             Wheelchair Mobility    Modified Rankin (Stroke Patients Only)       Balance Overall balance assessment: Needs assistance Sitting-balance support: No upper extremity supported Sitting balance-Leahy Scale: Fair     Standing balance support: During functional activity;Single extremity supported Standing balance-Leahy Scale: Poor Standing balance comment: heavy reliance on external support                            Cognition Arousal/Alertness: Awake/alert Behavior During Therapy: WFL for tasks assessed/performed Overall Cognitive Status: Within Functional Limits for tasks assessed  Exercises      General Comments        Pertinent Vitals/Pain Pain Assessment: Faces Pain Score: 6  Faces Pain Scale: Hurts even more Pain Location: R elbow Pain Descriptors / Indicators: Discomfort Pain Intervention(s): Limited activity within patient's tolerance;Monitored during session;Repositioned    Home Living                      Prior Function            PT Goals (current goals can now be found in the care  plan section) Acute Rehab PT Goals Patient Stated Goal: go home with some help PT Goal Formulation: With patient Time For Goal Achievement: 10/10/20 Potential to Achieve Goals: Good Progress towards PT goals: Progressing toward goals    Frequency    Min 3X/week      PT Plan Current plan remains appropriate    Co-evaluation              AM-PAC PT "6 Clicks" Mobility   Outcome Measure  Help needed turning from your back to your side while in a flat bed without using bedrails?: A Little Help needed moving from lying on your back to sitting on the side of a flat bed without using bedrails?: A Little Help needed moving to and from a bed to a chair (including a wheelchair)?: A Lot Help needed standing up from a chair using your arms (e.g., wheelchair or bedside chair)?: A Little Help needed to walk in hospital room?: A Lot Help needed climbing 3-5 steps with a railing? : A Lot 6 Click Score: 15    End of Session Equipment Utilized During Treatment: Gait belt Activity Tolerance: Patient limited by fatigue Patient left: in chair;with call bell/phone within reach;with chair alarm set;with family/visitor present Nurse Communication: Mobility status PT Visit Diagnosis: Unsteadiness on feet (R26.81);Difficulty in walking, not elsewhere classified (R26.2);Pain Pain - Right/Left: Right Pain - part of body: Arm     Time: [id number] PT Time Calculation (min) (ACUTE ONLY): 32 min  Charges:  $Therapeutic Activity: 23-37 mins                     Verner Mould, DPT Acute Rehabilitation Services Office [phone] Pager [phone]    [name] 09/27/2020, 12:46 PM

## 2020-09-27 NOTE — Progress Notes (Signed)
[facility]  Date of Admission:  09/22/2020           Reason for visit: Follow up on septic olecranon bursitis, abscess of right hand  Current antibiotics: Fluconazole 8/5-present  Previous antibiotics: Daptomycin 8/5-8/8  ASSESSMENT:    Septic olecranon bursitis: Cultures this admission from the OR with Candida albicans.  Susceptibilities have been sent to [facility] for further testing.  She previously had completed a 3-week course of fluconazole in June 2022 after aspiration cultures grew the same.  This admission she has undergone excisional debridement and bursectomy with I&D on 09/22/2020. Rheumatoid nodule/abscess of the right hand: Cultures as well with Candida albicans.  She is status post excisional debridement of the dorsal rheumatoid nodule with I&D of the third MCP on 09/22/2020. Elevated LFTs Diabetes: A1c 8.4 Rheumatoid arthritis Chronic kidney disease: Creatinine today is stable at 1.35 Chronic CHF with newly reduced EF: Being followed by cardiology  PLAN:    Continue fluconazole dosed by pharmacy Candida susceptibility testing sent to [facility], glycemic control Repeat LFTs in the morning Will follow  Principal Problem:   Septic olecranon bursitis of right elbow Active Problems:   Hyperlipidemia   GERD   Chronic combined systolic and diastolic CHF (congestive heart failure) (HCC)   Chronic kidney disease, stage 3b (HCC)   Pulmonary nodules   Hypothyroidism   Rheumatoid arthritis ([place])   Tracheobronchomalacia   Cellulitis of right hand   Abscess of dorsum of right hand   Osteomyelitis of second toe of right foot ([place])   DMII (diabetes mellitus, type 2) ([place])   History of DVT (deep vein thrombosis)   Physical deconditioning    MEDICATIONS:    Scheduled Meds:  allopurinol  100 mg Oral Daily   arformoterol  15 mcg Nebulization BID   And   umeclidinium bromide  1 puff Inhalation Daily   cyproheptadine  8 mg Oral  QPM   docusate sodium  100 mg Oral BID   famotidine  20 mg Oral Daily   fluconazole  200 mg Oral Daily   fluticasone  2 spray Each Nare Daily   folic acid  1 mg Oral Daily   gabapentin  600 mg Oral QHS   guaiFENesin  600 mg Oral BID   insulin aspart  0-15 Units Subcutaneous TID WC   insulin aspart  0-5 Units Subcutaneous QHS   loratadine  10 mg Oral Daily   magnesium oxide  400 mg Oral Daily   mirtazapine  30 mg Oral QHS   montelukast  10 mg Oral Daily   multivitamin with minerals  1 tablet Oral Daily   pantoprazole  40 mg Oral QPM   predniSONE  15 mg Oral Q breakfast   rivaroxaban  20 mg Oral Q supper   sacubitril-valsartan  1 tablet Oral BID   tetrahydrozoline  1 drop Both Eyes BID   Continuous Infusions: PRN Meds:.acetaminophen, albuterol, azelastine, bisacodyl, dextromethorphan, diclofenac Sodium, ondansetron **OR** ondansetron (ZOFRAN) IV, polyethylene glycol, senna, traMADol  SUBJECTIVE:   24 hour events:  No acute events noted overnight  Patient without any acute complaints this afternoon.  She endorses pain on the top of her right foot is neuropathic in nature.  She has no new complaints regarding her right hand or elbow.  No fevers or chills.  She is deciding between rehab versus going home at discharge.  Review of Systems  All other systems reviewed and are negative.  OBJECTIVE:   Blood pressure 122/66, pulse 87, temperature (!) 97.3 F (36.3 C), temperature source Oral, resp. rate 17, height '5\' 1"'$  (1.549 m), weight 63.5 kg, SpO2 97 %. Body mass index is 26.45 kg/m.  Physical Exam Constitutional:      General: She is not in acute distress.    Appearance: Normal appearance.  HENT:     Head: Normocephalic and atraumatic.  Pulmonary:     Effort: Pulmonary effort is normal. No respiratory distress.  Musculoskeletal:     Comments: Right arm is wrapped in gauze and Ace bandage from elbow to hand.  Right foot is in a Darco foot boot  Skin:    General: Skin is  warm and dry.     Findings: No rash.  Neurological:     General: No focal deficit present.     Mental Status: She is alert and oriented to person, place, and time.  Psychiatric:        Mood and Affect: Mood normal.        Behavior: Behavior normal.     Lab Results: Lab Results  Component Value Date   WBC 16.7 (H) 09/27/2020   HGB 11.3 (L) 09/27/2020   HCT 36.6 09/27/2020   MCV 108.0 (H) 09/27/2020   PLT 266 09/27/2020    Lab Results  Component Value Date   NA 139 09/27/2020   K 4.3 09/27/2020   CO2 24 09/27/2020   GLUCOSE 108 (H) 09/27/2020   BUN 48 (H) 09/27/2020   CREATININE 1.35 (H) 09/27/2020   CALCIUM 8.7 (L) 09/27/2020   GFRNONAA 42 (L) 09/27/2020   GFRAA 40 (L) 08/25/2020    Lab Results  Component Value Date   ALT 60 (H) 09/23/2020   AST 62 (H) 09/23/2020   ALKPHOS 75 09/23/2020   BILITOT 1.9 (H) 09/23/2020       Component Value Date/Time   CRP 17.1 (H) 03/08/2018 0310       Component Value Date/Time   ESRSEDRATE 41 (H) 07/29/2018 1014     I have reviewed the micro and lab results in Epic.  Imaging: No results found.   Imaging independently reviewed in Epic.    Raynelle Highland for Infectious [facility] [phone] pager 09/27/2020, 2:56 PM  I spent greater than 35 minutes with the patient including greater than 50% of time in face to face counsel of the patient and in coordination of their care.

## 2020-09-27 NOTE — Progress Notes (Signed)
Occupational Therapy Treatment Patient Details Name: Ebony Scott MRN: [id number] DOB: 06-Apr-1950 Today's Date: 09/27/2020    History of present illness Ebony Scott is a 70 y.o. female with medical history significant of ashtma, combined systolic/diastolic HF, tracheobronchomalacia,  Right second digit internal amputation 6/. 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 Patient s/p Excisional debridement, right olecranon bursa with olecranon bursectomy and irrigation and debridement and excisional debridement, right dorsal rheumatoid nodule, overlying the long metacarpal phalangeal joint.   OT comments  Patient reported having increased pain on this date with patient declining to participate in functional mobility at this time.nursing made aware and pain medication was provided during session.  Patient agreed to transfer out of bed to recliner. Patient was min guard for supine to sit on edge of bed. Patient was able to participate in combing hair sitting on edge of bed with LUE. Patient reported not wanting to go to SNF at this time wanting to go home. Patient reported she was aware she needed more help to be able to go home at time of d/c. Patient's discharge plan remains appropriate at this time. OT will continue to follow acutely.    Follow Up Recommendations  Home health OT;SNF    Equipment Recommendations  None recommended by OT    Recommendations for Other Services      Precautions / Restrictions Precautions Precautions: Fall Required Braces or Orthoses: Splint/Cast Splint/Cast: long posterior splint RUE, surgical shoe on Right, has misplaced left house shoe since admission  to balance out Restrictions Weight Bearing Restrictions: Yes RUE Weight Bearing: Weight bearing as tolerated       Mobility Bed Mobility Overal bed mobility: Needs Assistance Bed Mobility: Supine to Sit     Supine to sit: Min assist;HOB elevated           Transfers Overall transfer level: Needs assistance Equipment used: Rolling walker (2 wheeled) Transfers: Sit to/from [facility] Sit to Stand: Min guard Stand pivot transfers: Min guard            Balance Overall balance assessment: Needs assistance Sitting-balance support: No upper extremity supported Sitting balance-Leahy Scale: Fair     Standing balance support: During functional activity;Single extremity supported Standing balance-Leahy Scale: Fair Standing balance comment: supports with LUE                           ADL either performed or assessed with clinical judgement   ADL     Eating/Feeding Details (indicate cue type and reason): patient was able to sit on edge of bed with BLE supported to take medication and comb hair on this date. Grooming: Set up;Sitting Grooming Details (indicate cue type and reason): patient required assist to spray detangler in hair but was able to complete combing afterwards.                             Functional mobility during ADLs: Min guard;Rolling walker General ADL Comments: patient required increased education to complete transfer prior to attempting to sit down with reaching LUE back. patient verbalized understanding.     Vision Patient Visual Report: No change from baseline     Perception     Praxis      Cognition Arousal/Alertness: Awake/alert Behavior During Therapy: WFL for tasks assessed/performed Overall Cognitive Status: Within Functional Limits for tasks assessed  Exercises     Shoulder Instructions       General Comments      Pertinent Vitals/ Pain       Pain Assessment: 0-10 Pain Score: 6  Pain Location: R elbow Pain Descriptors / Indicators: Discomfort Pain Intervention(s): Patient requesting pain meds-RN notified;RN gave pain meds during session;Limited activity within patient's  tolerance  Home Living                                          Prior Functioning/Environment              Frequency  Min 2X/week        Progress Toward Goals  OT Goals(current goals can now be found in the care plan section)  Progress towards OT goals: Progressing toward goals  Acute Rehab OT Goals Patient Stated Goal: go home with some help OT Goal Formulation: With patient Time For Goal Achievement: 10/07/20 Potential to Achieve Goals: Good  Plan Discharge plan remains appropriate    Co-evaluation                 AM-PAC OT "6 Clicks" Daily Activity     Outcome Measure   Help from another person eating meals?: A Little Help from another person taking care of personal grooming?: A Little Help from another person toileting, which includes using toliet, bedpan, or urinal?: A Lot Help from another person bathing (including washing, rinsing, drying)?: A Lot Help from another person to put on and taking off regular upper body clothing?: A Lot Help from another person to put on and taking off regular lower body clothing?: A Lot 6 Click Score: 14    End of Session Equipment Utilized During Treatment: Rolling walker;Gait belt  OT Visit Diagnosis: Unsteadiness on feet (R26.81);Pain Pain - Right/Left: Right Pain - part of body: Arm   Activity Tolerance Patient tolerated treatment well   Patient Left in chair;with call bell/phone within reach;with chair alarm set   Nurse Communication Mobility status        Time: [id number] OT Time Calculation (min): 23 min  Charges: OT General Charges $OT Visit: 1 Visit OT Treatments $Self Care/Home Management : 23-37 mins  Jackelyn Poling OTR/L, MS Acute Rehabilitation Department Office# [phone] Pager# [phone]    [place] 09/27/2020, 9:28 AM

## 2020-09-27 NOTE — Progress Notes (Signed)
Patient seen and examined.  Complains of some pain in the hand and elbow as well as the foot.  Dressings changed, packing removed, elbow with just a little bit of serous drainage, metacarpophalangeal joint wound looks good.  Impression: Septic right olecranon bursa and right long finger dorsal abscess with Candida albicans  Plan: Doing fair from my standpoint, she still struggling a little bit from a pulmonary standpoint, and is working to be optimized.  We are certainly not out of the woods yet as far as the infections ago, and this is particularly challenging given her immunocompromise status with her rheumatoid disease.  Continue with dressing changes, antibiotics/antifungal medications per infectious disease, additional medical management per primary team  Marchia Bond, MD

## 2020-09-27 NOTE — Progress Notes (Addendum)
Progress Note    Ebony Scott   NTI:[id number]  DOB: 1950-12-14  DOA: 09/22/2020     5  PCP: [name], MD  CC: hand pain  Hospital Course: Ebony Scott is a 70 yo female with PMH DVT, anxiety/depression, RA, breast cancer, cdiff, diverticulosis, fibromyalgia, GERD, colonic polyps, HLD, DMII, CKD3b, hypothyroidism, IBS, moderate persistent asthma, osteoporosis, PVD, tracheobronchomalacia who presented with right hand pain and swelling.  She has been managed outpatient recently by ortho due to this; she has undergone previous aspirations and been seen by ID in past.  She was admitted for ortho evaluation for further debridement of her right hand and right elbow, which was performed on 09/22/20. ID will again follow inpatient for assistance with her complex recurrent infections.  Cardiology was also consulted after her recent outpatient echo showed reduced EF and need for further workup and treatment.   Interval History:  Still lethargic and rundown this morning. But was sitting up in the chair. Right hand was sore when I moved pinky finger (had also had whole dressing changed this morning too).  I again discussed my concerns about her going home and needing more work with PT to see how she ambulates.  Also still adjusting some of her meds (lasix) and will need final ortho recs for dressing changes as well as ID rec's for final abx course.   Was able to talk with her daughter, Joseph Art, on the phone this afternoon as well to update and discuss plan.   ROS: Constitutional: negative for chills and fevers, Respiratory: negative for cough and sputum, Cardiovascular: negative for chest pain, and Gastrointestinal: negative for abdominal pain  Assessment & Plan: * Septic olecranon bursitis of right elbow - has been followed by ID in the past; last seen in June 2022; has been on several courses of fluconazole for various albicans infections (see below as well) - underwent excisional debridement on  8/4 with ortho - ID following; see abscess hand too  Abscess of dorsum of right hand - s/p excisional debridement of dorsal rheumatoid nodule and I&D of 3rd MCP - follow up pending cultures: rare yeast (speciated to C. Albicans) growing in hand and synovial cx; sensitivities being investigated - dapto discontinued per ID given yeast only - continue fluconazole; ID continuing to follow - dressing changes as per ortho; changed today, 8/9 (healing well, some serous drainage from elbow noted); will need final dressing changes rec's prior to d/c   Cellulitis of right hand - see hand abscess   Chronic combined systolic and diastolic CHF (congestive heart failure) ([place]) - recent echo on 09/16/20 shows EF now reduced compared to prior echo 07/09/20 - EF now 35-40%, WMA; Gr 2 DD - patient was attempted to be contacted by PCP within the last few days but unsuccessful; she was going to be started on Farxiga and ARB with a cardiology referral - Entresto started on 8/5 per cardiology, has been uptitrated on 8/8; Wilder Glade on hold for now due to fungal infection (also need to ensure renal fxn appropriate when restarting) - small bump in BUN/Creat on 8/8; I've held IV lasix for now and will defer to cardiology on further dosing  - tentative plan is left/right heart cath for ischemic evaluation (not considered a good candidate for other modalities) once infection resolved and patient more stable (outpatient anticipated)  Physical deconditioning - Concern would be patient's right upper extremity and her ability to ambulate safely with a walker or a cane.  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 on 6/3 - following with podiatry; recently seen 09/09/20; toe healing as expected - WBAT in surgical shoe - continue betadine wet to dry dressing changes   Rheumatoid arthritis ([place]) - treated with rituxan and prednisone 15 mg daily   Hyperlipidemia - no recent lipid panel; TC 318, TG 475, HDL 38, unable to calc LDL - was on lipitor 10 mg daily at home; no noted intolerance on very long allergy profile - agree with cardiology increase of lipitor to 80 mg daily; this will have to wait until fluconazole course is complete due to interaction and will resume upon completion of fluconazole (there was also a dapto interaction as well)  DMII (diabetes mellitus, type 2) ([place]) - A1c 8.4% on 09/22/20 - recently to be started on Farxiga (see above) outpatient to aid in CHF treatment as well - continue SSI and CBG monitoring  - will probably need insulin at discharge (just sliding scale likely)  Tracheobronchomalacia - followed by pulm - continue stiolto   Hypothyroidism - ?hx hyperthryoidism, likely treated - no synthroid on med rec - check TSH = normal, 0.983  Pulmonary nodules - followed closely by pulmonology outpatient - s/p BAL on 04/05/20; grew C. Albicans at that time - CT on 4/6 showed improved/decrease in size of nodules compared to 2/4 scan  Chronic kidney disease, stage 3b ([place]) - patient has history of CKD3b. Baseline creat ~ 1.2 - 1.4, eGFR 32-41 - creat 1.59 on admission; not far off baseline; some fluctuation expected while meds adjusted - continue trending BMP daily    GERD - Noted on laryngoscopy with ENT outpatient as well.  Recent evaluation on 09/09/2020 - Has been recommended to be on Pepcid every morning and Dexilant in the late afternoon  Laryngeal candidiasis-resolved as of 09/25/2020 - seen by  ENT and treated with diflucan on 04/2020 and 05/2020 for laryngeal candidiasis -Recent evaluation by ENT on 09/09/2020.  No evidence of candidiasis or fungal infection on laryngoscopy; she was recommended to continue Pepcid every morning and Dexilant before 5 PM - Etiology is considered presumed to ongoing immunosuppression, inhalers, and possibly her multiple recurrent infections   Old records reviewed in assessment of this patient  Antimicrobials: Vanc x 1 Ancef x 1 CTX x 1 Fluconazole 09/22/20 > current  Daptomycin 8/6 >> 09/26/20  DVT prophylaxis: rivaroxaban (XARELTO) tablet 20 mg   Code Status:   Code Status: DNR Family Communication: daughter Joseph Art)  Disposition Plan: Status is: Inpatient  Remains inpatient appropriate because:IV treatments appropriate due to intensity of illness or inability to take PO and Inpatient level of care appropriate due to severity of illness  Dispo: The patient is from: Home              Anticipated d/c is to: Home              Patient currently is not medically stable to d/c.   Difficult to place patient No Risk  of unplanned readmission score: Unplanned Admission- Pilot do not use: 30.7   Objective: Blood pressure 122/66, pulse 87, temperature (!) 97.3 F (36.3 C), temperature source Oral, resp. rate 17, height _0  (1.549 m), weight 63.5 kg, SpO2 97 %.  Examination: General appearance: alert, cooperative, and no distress Head: Normocephalic, without obvious abnormality, atraumatic Eyes:  EOMI Lungs: clear to auscultation bilaterally Heart: regular rate and rhythm and S1, S2 normal Abdomen:  soft, obese, NT, ND, BS present Extremities:  RUE wrapped in dressing from fingers to above elbow; no obvious swelling in RUE. Right foot wrapped in dressing with betadine noted on dressings and no obvious swelling or pain in RLE Skin: mobility and turgor normal Neurologic: no paresthesias; no obvious focal deficits  Consultants:  Orthopedic  surgery ID Cardiology   Procedures:  Excisional debridement RUE, 09/22/20  Data Reviewed: I have personally reviewed following labs and imaging studies Results for orders placed or performed during the hospital encounter of 09/22/20 (from the past 24 hour(s))  Glucose, capillary     Status: Abnormal   Collection Time: 09/26/20  4:58 PM  Result Value Ref Range   Glucose-Capillary 205 (H) 70 - 99 mg/dL  Glucose, capillary     Status: Abnormal   Collection Time: 09/26/20  9:32 PM  Result Value Ref Range   Glucose-Capillary 235 (H) 70 - 99 mg/dL  Basic metabolic panel     Status: Abnormal   Collection Time: 09/27/20  5:03 AM  Result Value Ref Range   Sodium 139 135 - 145 mmol/L   Potassium 4.3 3.5 - 5.1 mmol/L   Chloride 105 98 - 111 mmol/L   CO2 24 22 - 32 mmol/L   Glucose, Bld 108 (H) 70 - 99 mg/dL   BUN 48 (H) 8 - 23 mg/dL   Creatinine, Ser 1.35 (H) 0.44 - 1.00 mg/dL   Calcium 8.7 (L) 8.9 - 10.3 mg/dL   GFR, Estimated 42 (L) >60 mL/min   Anion gap 10 5 - 15  CBC with Differential/Platelet     Status: Abnormal   Collection Time: 09/27/20  5:03 AM  Result Value Ref Range   WBC 16.7 (H) 4.0 - 10.5 K/uL   RBC 3.39 (L) 3.87 - 5.11 MIL/uL   Hemoglobin 11.3 (L) 12.0 - 15.0 g/dL   HCT 36.6 36.0 - 46.0 %   MCV 108.0 (H) 80.0 - 100.0 fL   MCH 33.3 26.0 - 34.0 pg   MCHC 30.9 30.0 - 36.0 g/dL   RDW 16.7 (H) 11.5 - 15.5 %   Platelets 266 150 - 400 K/uL   nRBC 0.0 0.0 - 0.2 %   Neutrophils Relative % 74 %   Neutro Abs 12.3 (H) 1.7 - 7.7 K/uL   Lymphocytes Relative 7 %   Lymphs Abs 1.2 0.7 - 4.0 K/uL   Monocytes Relative 14 %   Monocytes Absolute 2.3 (H) 0.1 - 1.0 K/uL   Eosinophils Relative 3 %   Eosinophils Absolute 0.4 0.0 - 0.5 K/uL   Basophils Relative 0 %   Basophils Absolute 0.1 0.0 - 0.1 K/uL   Immature Granulocytes 2 %   Abs Immature Granulocytes 0.39 (H) 0.00 - 0.07 K/uL  Magnesium     Status: None   Collection Time: 09/27/20  5:03 AM  Result Value Ref Range    Magnesium 2.4 1.7 - 2.4 mg/dL  Glucose, capillary     Status: Abnormal   Collection Time: 09/27/20  7:36 AM  Result Value Ref Range  Glucose-Capillary 118 (H) 70 - 99 mg/dL   Comment 1 Notify RN    Comment 2 Document in Chart   Glucose, capillary     Status: Abnormal   Collection Time: 09/27/20 12:06 PM  Result Value Ref Range   Glucose-Capillary 205 (H) 70 - 99 mg/dL   Comment 1 Notify RN    Comment 2 Document in Chart    *Note: Due to a large number of results and/or encounters for the requested time period, some results have not been displayed. A complete set of results can be found in Results Review.    Recent Results (from the past 240 hour(s))  Blood Culture (routine x 2)     Status: None   Collection Time: 09/22/20  1:03 PM   Specimen: BLOOD  Result Value Ref Range Status   Specimen Description   Final    BLOOD RIGHT ANTECUBITAL Performed at [facility] [address] [place], [place] [address]    Special Requests   Final    BOTTLES DRAWN AEROBIC AND ANAEROBIC Blood Culture adequate volume Performed at [facility] [address], [place], [place] [address]    Culture   Final    NO GROWTH 5 DAYS Performed at [facility], [place] [address], [place], [place] [address]    Report Status 09/27/2020 FINAL  Final  Resp Panel by RT-PCR (Flu A&B, Covid) Nasopharyngeal Swab     Status: None   Collection Time: 09/22/20  1:04 PM   Specimen: Nasopharyngeal Swab; Nasopharyngeal(NP) swabs in vial transport medium  Result Value Ref Range Status   SARS Coronavirus 2 by RT PCR NEGATIVE NEGATIVE Final    Comment: (NOTE) SARS-CoV-2 target nucleic acids are NOT DETECTED.  The SARS-CoV-2 RNA is generally detectable in upper respiratory specimens during the acute phase of infection. The lowest concentration of SARS-CoV-2 viral copies this assay can detect is 138 copies/mL. A negative result does not preclude SARS-Cov-2 infection and should  not be used as the sole basis for treatment or other patient management decisions. A negative result may occur with  improper specimen collection/handling, submission of specimen other than nasopharyngeal swab, presence of viral mutation(s) within the areas targeted by this assay, and inadequate number of viral copies(<138 copies/mL). A negative result must be combined with clinical observations, patient history, and epidemiological information. The expected result is Negative.  Fact Sheet for Patients:  EntrepreneurPulse.com.au  Fact Sheet for Healthcare Providers:  IncredibleEmployment.be  This test is no t yet approved or cleared by the [place] FDA and  has been authorized for detection and/or diagnosis of SARS-CoV-2 by FDA under an Emergency Use Authorization (EUA). This EUA will remain  in effect (meaning this test can be used) for the duration of the COVID-19 declaration under Section 564(b)(1) of the Act, 21 U.S.C.section 360bbb-3(b)(1), unless the authorization is terminated  or revoked sooner.       Influenza A by PCR NEGATIVE NEGATIVE Final   Influenza B by PCR NEGATIVE NEGATIVE Final    Comment: (NOTE) The Xpert Xpress SARS-CoV-2/FLU/RSV plus assay is intended as an aid in the diagnosis of influenza from Nasopharyngeal swab specimens and should not be used as a sole basis for treatment. Nasal washings and aspirates are unacceptable for Xpert Xpress SARS-CoV-2/FLU/RSV testing.  Fact Sheet for Patients: EntrepreneurPulse.com.au  Fact Sheet for Healthcare Providers: IncredibleEmployment.be  This test is not yet approved or cleared by the [place] FDA and has been authorized for detection and/or  diagnosis of SARS-CoV-2 by FDA under an Emergency Use Authorization (EUA). This EUA will remain in effect (meaning this test can be used) for the duration of the COVID-19 declaration under  Section 564(b)(1) of the Act, 21 U.S.C. section 360bbb-3(b)(1), unless the authorization is terminated or revoked.  Performed at [facility], [place] [address], [place], [place] [address]   Blood Culture (routine x 2)     Status: None   Collection Time: 09/22/20  1:08 PM   Specimen: BLOOD  Result Value Ref Range Status   Specimen Description   Final    BLOOD LEFT ANTECUBITAL Performed at [facility] [address] [place], [place] [address]    Special Requests   Final    BOTTLES DRAWN AEROBIC AND ANAEROBIC Blood Culture adequate volume Performed at [facility] [address], [place], [place] [address]    Culture   Final    NO GROWTH 5 DAYS Performed at [facility], [place] [address], [place], [place] [address]    Report Status 09/27/2020 FINAL  Final  Aerobic/Anaerobic Culture w Gram Stain (surgical/deep wound)     Status: None (Preliminary result)   Collection Time: 09/22/20 10:05 PM   Specimen: Synovial, Right Elbow; Tissue  Result Value Ref Range Status   Specimen Description   Final    SYNOVIAL Performed at [facility] [address], [place], [place] [address]    Special Requests   Final    NONE Performed at [facility], [place] [address] [place], [place] [address]    Gram Stain   Final    RARE WBC PRESENT,BOTH PMN AND MONONUCLEAR NO ORGANISMS SEEN    Culture   Final    RARE CANDIDA ALBICANS NO ANAEROBES ISOLATED; CULTURE IN PROGRESS FOR 5 DAYS CRITICAL RESULT CALLED TO, READ BACK BY AND VERIFIED WITH: DR. Mardelle Matte VIA EPIC [phone] [id number] FCP Sent to [facility] for further susceptibility testing. Performed at [facility], [place] [address] [place], [place] [address]    Report Status PENDING  Incomplete  Fungus Stain     Status: None   Collection Time: 09/22/20 10:05 PM   Specimen: Synovial, Right Elbow; Tissue  Result Value Ref Range Status   FUNGUS STAIN  DUPLICATE  Final    Comment: (NOTE) Duplicate procedure ordered.      Refer to [phone] for results. Performed At: [facility] [place], [place] [id number] Rush Farmer MD XY:[id number]    Fungal Source SYNOVIAL  Final    Comment: Performed at [facility], [place] [address] [place], [place] [address]  Acid Fast Smear (AFB)     Status: None   Collection Time: 09/22/20 10:05 PM   Specimen: Synovial, Right Elbow; Tissue  Result Value Ref Range Status   AFB Specimen Processing Comment  Final    Comment: Tissue Grinding and Digestion/Decontamination   Acid Fast Smear Negative  Final    Comment: (NOTE) Performed At: [facility] [address] [place], [place] [id number] Rush Farmer MD QR:[id number]    Source (AFB) HAND  Final    Comment: Performed at [facility], [place] [address], [place], [place] [address]  Aerobic/Anaerobic Culture w Gram Stain (surgical/deep wound)     Status: None (Preliminary result)   Collection Time: 09/22/20 10:05 PM   Specimen: Hand, Right; Body Fluid  Result Value Ref Range Status   Specimen Description   Final  HAND Performed at [facility], [place] [address], [place], [place] [address]    Special Requests   Final    NONE Performed at [facility], [place] [address], [place], [place] [address]    Gram Stain   Final    RARE WBC PRESENT, PREDOMINANTLY PMN NO ORGANISMS SEEN Performed at [facility], [place] [address], [place], [place] [address]    Culture   Final    RARE CANDIDA ALBICANS NO ANAEROBES ISOLATED; CULTURE IN PROGRESS FOR 5 DAYS    Report Status PENDING  Incomplete  Acid Fast Smear (AFB)     Status: None   Collection Time: 09/22/20 10:05 PM   Specimen: Hand, Right; Body Fluid  Result Value Ref Range Status   AFB Specimen Processing Comment  Final    Comment: Tissue Grinding and Digestion/Decontamination   Acid Fast  Smear Negative  Final    Comment: (NOTE) Performed At: [facility] [place], [place] [id number] Rush Farmer MD NO:[id number]    Source (AFB) SYNOVIAL  Final    Comment: Performed at [facility], [place] [address], [place], [place] [address]     Radiology Studies: No results found. DG Chest Port 1 View  Final Result    DG Hand Complete Right  Final Result      Scheduled Meds:  allopurinol  100 mg Oral Daily   arformoterol  15 mcg Nebulization BID   And   umeclidinium bromide  1 puff Inhalation Daily   cyproheptadine  8 mg Oral QPM   docusate sodium  100 mg Oral BID   famotidine  20 mg Oral Daily   fluconazole  200 mg Oral Daily   fluticasone  2 spray Each Nare Daily   folic acid  1 mg Oral Daily   gabapentin  600 mg Oral QHS   guaiFENesin  600 mg Oral BID   insulin aspart  0-15 Units Subcutaneous TID WC   insulin aspart  0-5 Units Subcutaneous QHS   loratadine  10 mg Oral Daily   magnesium oxide  400 mg Oral Daily   mirtazapine  30 mg Oral QHS   montelukast  10 mg Oral Daily   multivitamin with minerals  1 tablet Oral Daily   pantoprazole  40 mg Oral QPM   predniSONE  15 mg Oral Q breakfast   rivaroxaban  20 mg Oral Q supper   sacubitril-valsartan  1 tablet Oral BID   tetrahydrozoline  1 drop Both Eyes BID   PRN Meds: acetaminophen, albuterol, azelastine, bisacodyl, dextromethorphan, diclofenac Sodium, ondansetron **OR** ondansetron (ZOFRAN) IV, polyethylene glycol, senna, traMADol Continuous Infusions:     LOS: 5 days  Time spent: Greater than 50% of the 35 minute visit was spent in counseling/coordination of care for the patient as laid out in the A&P.   Dwyane Dee, MD [facility] 09/27/2020, 12:52 PM

## 2020-09-27 NOTE — Progress Notes (Addendum)
Progress Note  Patient Name: Ebony Scott Date of Encounter: 09/27/2020  [facility] Cardiologist: Buford Dresser, MD   Subjective   No acute overnight events. Patient continues to feel very tired. She states her breathing is "terrible" but unchanged from yesterday. Difficult to say how different this is from her baseline. No chest pain. Still having right elbow and hand pain as well.  Inpatient Medications    Scheduled Meds:  allopurinol  100 mg Oral Daily   arformoterol  15 mcg Nebulization BID   And   umeclidinium bromide  1 puff Inhalation Daily   cyproheptadine  8 mg Oral QPM   docusate sodium  100 mg Oral BID   famotidine  20 mg Oral Daily   fluconazole  200 mg Oral Daily   fluticasone  2 spray Each Nare Daily   folic acid  1 mg Oral Daily   gabapentin  600 mg Oral QHS   guaiFENesin  600 mg Oral BID   insulin aspart  0-15 Units Subcutaneous TID WC   insulin aspart  0-5 Units Subcutaneous QHS   loratadine  10 mg Oral Daily   magnesium oxide  400 mg Oral Daily   mirtazapine  30 mg Oral QHS   montelukast  10 mg Oral Daily   multivitamin with minerals  1 tablet Oral Daily   pantoprazole  40 mg Oral QPM   predniSONE  15 mg Oral Q breakfast   rivaroxaban  20 mg Oral Q supper   sacubitril-valsartan  1 tablet Oral BID   tetrahydrozoline  1 drop Both Eyes BID   Continuous Infusions:  PRN Meds: acetaminophen, albuterol, azelastine, bisacodyl, dextromethorphan, diclofenac Sodium, morphine injection, ondansetron **OR** ondansetron (ZOFRAN) IV, oxyCODONE, polyethylene glycol, senna   Vital Signs    Vitals:   09/26/20 1941 09/26/20 2130 09/27/20 0626 09/27/20 0815  BP:  (!) 108/45 122/66   Pulse:  87 87   Resp:  18 17   Temp:  (!) 97.5 F (36.4 C) (!) 97.3 F (36.3 C)   TempSrc:  Oral Oral   SpO2: 96% 95% 96% 97%  Weight:      Height:        Intake/Output Summary (Last 24 hours) at 09/27/2020 1006 Last data filed at 09/26/2020 1300 Gross per 24 hour   Intake 480 ml  Output --  Net 480 ml   Last 3 Weights 09/22/2020 09/06/2020 08/30/2020  Weight (lbs) 140 lb 143 lb 144 lb  Weight (kg) 63.504 kg 64.864 kg 65.318 kg      Telemetry    Normal sinus rhythm with rates in the 80s to 90s. - Personally Reviewed  ECG    No new ECG tracing. - Personally Reviewed  Physical Exam   GEN: No acute distress.   Neck: No JVD. Cardiac: RRR. No murmurs, rubs, or gallops. Respiratory: No increased work of breathing. Mild rhonchi/possible course crackles noted in bilateral bases.  GI: Soft, non-distended, and non-tender. MS: No lower extremity edema. Skin: Warm and dry. Neuro:  No focal deficits. Psych: Normal affect. Responds appropriately.  Labs    High Sensitivity Troponin:  No results for input(s): TROPONINIHS in the last 720 hours.    Chemistry Recent Labs  Lab 09/22/20 1302 09/22/20 2352 09/23/20 0010 09/24/20 0618 09/25/20 0545 09/26/20 0457 09/27/20 0503  NA 138  --  136   < > 139 138 139  K 3.9  --  5.3*   < > 4.0 3.5 4.3  CL 98  --  98   < > 105 102 105  CO2 27  --  25   < > '26 25 24  '$ GLUCOSE 157*  --  175*   < > 170* 156* 108*  BUN 29*  --  28*   < > 42* 53* 48*  CREATININE 1.59*   < > 1.59*   < > 1.31* 1.47* 1.35*  CALCIUM 10.0  --  9.3   < > 8.5* 8.5* 8.7*  PROT 6.5  --  6.2*  --   --   --   --   ALBUMIN 3.3*  --  3.0*  --   --   --   --   AST 53*  --  62*  --   --   --   --   ALT 72*  --  60*  --   --   --   --   ALKPHOS 82  --  75  --   --   --   --   BILITOT 1.1  --  1.9*  --   --   --   --   GFRNONAA 34*   < > 34*   < > 43* 38* 42*  ANIONGAP 13  --  13   < > '8 11 10   '$ < > = values in this interval not displayed.     Hematology Recent Labs  Lab 09/25/20 0545 09/26/20 0457 09/27/20 0503  WBC 18.6* 15.8* 16.7*  RBC 3.41* 3.35* 3.39*  HGB 11.3* 11.0* 11.3*  HCT 36.1 35.8* 36.6  MCV 105.9* 106.9* 108.0*  MCH 33.1 32.8 33.3  MCHC 31.3 30.7 30.9  RDW 16.4* 16.4* 16.7*  PLT 246 238 266    BNP Recent  Labs  Lab 09/22/20 1426  BNP 424.3*     DDimer No results for input(s): DDIMER in the last 168 hours.   Radiology    No results found.  Cardiac Studies   Echocardiogram 09/16/2020:  1. Septal apical and anterior wall hypokinesis EF decreased compard to  TTE done 07/10/19 . Left ventricular ejection fraction, by estimation, is  35 to 40%. The left ventricle has moderately decreased function. The left  ventricle demonstrates regional  wall motion abnormalities (see scoring diagram/findings for description).  The left ventricular internal cavity size was moderately dilated. Left  ventricular diastolic parameters are consistent with Grade II diastolic  dysfunction (pseudonormalization).  Elevated left ventricular end-diastolic pressure.   2. Right ventricular systolic function is normal. The right ventricular  size is normal.   3. Left atrial size was mildly dilated.   4. The mitral valve is abnormal. Mild mitral valve regurgitation. No  evidence of mitral stenosis.   5. The aortic valve is tricuspid. There is moderate calcification of the  aortic valve. Aortic valve regurgitation is not visualized. Mild to  moderate aortic valve sclerosis/calcification is present, without any  evidence of aortic stenosis.   6. The inferior vena cava is normal in size with greater than 50%  respiratory variability, suggesting right atrial pressure of 3 mmHg.  Patient Profile     70 y.o. female with a history of diffuse aorta and coronary calcifications noted on chest CT in Q000111Q, chronic diastolic CHF, SVT, prior PE on lifelong anticoagulation, asthma, tracheobronchomalacia on CPAP, bronchiectasis, vocal cord dysfunction, seronegative rheumatoid arthritis, and CKD stage III. Patient was admitted for septic right elbow olecranon bursitis and cellulitis of right hand. S/p I&D on 09/22/2020. Cardiology consulted for further evaluation of recent outpatient  Echo which showed newly reduced EF of 35-40% with  new wall motion abnormalities.  Assessment & Plan    Chronic Heart Failure with Newly Reduced EF - Echo on 09/16/2020 showed LVEF of 35-40% with septal apical and anterior wall hypokinesis which are new. EF previously >75% in 2021. - BNP elevated at 424. - Chest x-ray showed no acute findings. - Initially on IV Lasix but this was stopped on 8/8 due to slight rise in creatinine. Net negative 886 L this admission. No updated weights. Renal function stable. - Mild rhonchi/possible course crackles noted in bases but otherwise does not appear significant volume overloaded on exam. Patient did not have any improvement on Lasix before. Will discuss whether any additional Lasix is needed with MD. [name] increased to 49-'51mg'$  twice daily yesterday evening. Has only received one dose so fat but BP and renal function tolerating well. - If BP remains stable today, can consider adding low dose Bisoprolol tomorrow. - Initially started on Farxiga but this was stopped due to active fungal infection. - Patient will ultimately need an ischemic evaluation  Prior chest CT showed diffuse coronary calcifications. Plan is for outpatient cardiac catheterization once patient recovers from acute infection.  Hypertension - BP mostly well controlled. - Continue medications for CHF as above.   Hyperlipidemia - Lipid panel this admission: Total Cholesterol 318, Triglycerides 475, HDL 38. LDL could not be calculated due to Triglyceride level. Direct LDL pending. Last LDL I can see in our system was 132 in 2018. - LDL goal <70 given CAD. - On Lipitor '10mg'$  daily at home. Will go ahead and increase to '80mg'$  given LDL in the past and degree of coronary calcifications.   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   CKD Stage III - Creatinien stable at 1.35. - Continue to monior.   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  I have reviewed the chart, notes and new data.  I agree with PA/NP's note.  Key new complaints: feels sleepy (likely due to analgesics earlier) Key examination changes: clear lungs, RRR, no JVD, no edema Key new findings / data: discussed echo findings  PLAN: Continue gradual titration of HF meds. Ad bisoprolol 2.5 mg daily in AM if BP allows. Once infection resolved, will need invasive coronary angiography, followed by revascularization. Although she is likely to have multivessel and diffuse CAD, the threshold for recommending CABG will be high due to her comorbid conditions. May better served by judicious targeted PCI.  Sanda Klein, MD, [place] [phone] 09/27/2020, 1:31 PM

## 2020-09-28 ENCOUNTER — Ambulatory Visit: Payer: Medicare PPO | Admitting: Pulmonary Disease

## 2020-09-28 ENCOUNTER — Inpatient Hospital Stay (HOSPITAL_COMMUNITY): Payer: Medicare PPO

## 2020-09-28 DIAGNOSIS — E782 Mixed hyperlipidemia: Secondary | ICD-10-CM

## 2020-09-28 DIAGNOSIS — M869 Osteomyelitis, unspecified: Secondary | ICD-10-CM

## 2020-09-28 DIAGNOSIS — M71121 Other infective bursitis, right elbow: Secondary | ICD-10-CM | POA: Diagnosis not present

## 2020-09-28 LAB — BASIC METABOLIC PANEL
Anion gap: 9 (ref 5–15)
BUN: 44 mg/dL — ABNORMAL HIGH (ref 8–23)
CO2: 22 mmol/L (ref 22–32)
Calcium: 8.9 mg/dL (ref 8.9–10.3)
Chloride: 109 mmol/L (ref 98–111)
Creatinine, Ser: 1.22 mg/dL — ABNORMAL HIGH (ref 0.44–1.00)
GFR, Estimated: 47 mL/min — ABNORMAL LOW (ref >60)
Glucose, Bld: 136 mg/dL — ABNORMAL HIGH (ref 70–99)
Potassium: 4.5 mmol/L (ref 3.5–5.1)
Sodium: 140 mmol/L (ref 135–145)

## 2020-09-28 LAB — HEPATIC FUNCTION PANEL
ALT: 34 U/L (ref 0–44)
AST: 32 U/L (ref 15–41)
Albumin: 2.4 g/dL — ABNORMAL LOW (ref 3.5–5.0)
Alkaline Phosphatase: 71 U/L (ref 38–126)
Bilirubin, Direct: 0.1 mg/dL (ref 0.0–0.2)
Indirect Bilirubin: 0.4 mg/dL (ref 0.3–0.9)
Total Bilirubin: 0.5 mg/dL (ref 0.3–1.2)
Total Protein: 5.6 g/dL — ABNORMAL LOW (ref 6.5–8.1)

## 2020-09-28 LAB — CBC WITH DIFFERENTIAL/PLATELET
Abs Immature Granulocytes: 0.55 K/uL — ABNORMAL HIGH (ref 0.00–0.07)
Basophils Absolute: 0.1 K/uL (ref 0.0–0.1)
Basophils Relative: 1 %
Eosinophils Absolute: 0.3 K/uL (ref 0.0–0.5)
Eosinophils Relative: 2 %
HCT: 33.7 % — ABNORMAL LOW (ref 36.0–46.0)
Hemoglobin: 10.2 g/dL — ABNORMAL LOW (ref 12.0–15.0)
Immature Granulocytes: 3 %
Lymphocytes Relative: 7 %
Lymphs Abs: 1.2 K/uL (ref 0.7–4.0)
MCH: 32.8 pg (ref 26.0–34.0)
MCHC: 30.3 g/dL (ref 30.0–36.0)
MCV: 108.4 fL — ABNORMAL HIGH (ref 80.0–100.0)
Monocytes Absolute: 2.3 K/uL — ABNORMAL HIGH (ref 0.1–1.0)
Monocytes Relative: 13 %
Neutro Abs: 13.2 K/uL — ABNORMAL HIGH (ref 1.7–7.7)
Neutrophils Relative %: 74 %
Platelets: 252 K/uL (ref 150–400)
RBC: 3.11 MIL/uL — ABNORMAL LOW (ref 3.87–5.11)
RDW: 16.7 % — ABNORMAL HIGH (ref 11.5–15.5)
WBC: 17.6 K/uL — ABNORMAL HIGH (ref 4.0–10.5)
nRBC: 0 % (ref 0.0–0.2)

## 2020-09-28 LAB — GLUCOSE, CAPILLARY
Glucose-Capillary: 203 mg/dL — ABNORMAL HIGH (ref 70–99)
Glucose-Capillary: 105 mg/dL — ABNORMAL HIGH (ref 70–99)
Glucose-Capillary: 213 mg/dL — ABNORMAL HIGH (ref 70–99)
Glucose-Capillary: 189 mg/dL — ABNORMAL HIGH (ref 70–99)
Glucose-Capillary: 153 mg/dL — ABNORMAL HIGH (ref 70–99)

## 2020-09-28 LAB — MAGNESIUM: Magnesium: 2.4 mg/dL (ref 1.7–2.4)

## 2020-09-28 MED ORDER — INSULIN DETEMIR 100 UNIT/ML ~~LOC~~ SOLN
5.0000 [IU] | Freq: Every day | SUBCUTANEOUS | Status: DC
  Administered 2020-09-28 – 2020-09-29 (×2): 5 [IU] via SUBCUTANEOUS
  Filled 2020-09-28 (×3): qty 0.05

## 2020-09-28 MED ORDER — BISOPROLOL FUMARATE 5 MG PO TABS
2.5000 mg | ORAL_TABLET | Freq: Every day | ORAL | Status: DC
  Administered 2020-09-28 – 2020-09-30 (×3): 2.5 mg via ORAL
  Filled 2020-09-28 (×3): qty 1

## 2020-09-28 MED ORDER — POLYETHYLENE GLYCOL 3350 17 G PO PACK
17.0000 g | PACK | Freq: Two times a day (BID) | ORAL | Status: AC
  Administered 2020-09-28: 17 g via ORAL
  Filled 2020-09-28 (×2): qty 1

## 2020-09-28 MED ORDER — INSULIN ASPART 100 UNIT/ML IJ SOLN: 0.0000 [IU] | Freq: Every day | INTRAMUSCULAR | Status: DC

## 2020-09-28 MED ORDER — FUROSEMIDE 40 MG PO TABS
80.0000 mg | ORAL_TABLET | Freq: Every day | ORAL | Status: DC
  Administered 2020-09-28 – 2020-09-29 (×2): 80 mg via ORAL
  Filled 2020-09-28 (×2): qty 2

## 2020-09-28 MED ORDER — INSULIN ASPART 100 UNIT/ML IJ SOLN
0.0000 [IU] | Freq: Three times a day (TID) | INTRAMUSCULAR | Status: DC
  Administered 2020-09-28 – 2020-09-29 (×3): 5 [IU] via SUBCUTANEOUS
  Administered 2020-09-29 – 2020-09-30 (×2): 3 [IU] via SUBCUTANEOUS

## 2020-09-28 MED ORDER — PREDNISONE 10 MG PO TABS
12.0000 mg | ORAL_TABLET | Freq: Every day | ORAL | Status: DC
  Administered 2020-09-28 – 2020-09-30 (×3): 12 mg via ORAL
  Filled 2020-09-28 (×3): qty 2

## 2020-09-28 MED ORDER — INSULIN ASPART 100 UNIT/ML IJ SOLN
3.0000 [IU] | Freq: Three times a day (TID) | INTRAMUSCULAR | Status: DC
  Administered 2020-09-28 – 2020-09-30 (×6): 3 [IU] via SUBCUTANEOUS

## 2020-09-28 NOTE — Progress Notes (Signed)
[facility]  Date of Admission:  09/22/2020           Reason for visit: Follow up on septic olecranon bursitis, abscess of right hand   Current antibiotics: Fluconazole 8/5-present   Previous antibiotics: Daptomycin 8/5-8/8    ASSESSMENT:    Septic olecranon bursitis: Cultures from admission with Candida albicans.  Susceptibilities sent to [facility] for further testing.  She previously had completed a 3-week course of fluconazole in June 2022 after aspiration cultures grew the same organism.  This admission she underwent excisional debridement and bursectomy with I&D on 09/22/2020. Rheumatoid nodule/abscess of the right hand: Cultures also with Candida albicans.  Status post excisional debridement of the dorsal rheumatoid nodule with I&D of the third MCP on 09/22/2020. Right foot pain: She was diagnosed with second digit osteomyelitis and underwent second digit excision of the phalanx/internal amputation with podiatry on 07/22/2020.  This wound appears to have healed adequately, however, it is concerning that she is having such significant pain in her foot today. Elevated LFTs: Normalized this morning. Diabetes: A1c 8.4. Rheumatoid arthritis Chronic kidney disease: Creatinine today is stable at 1.2. Chronic CHF with newly reduced EF: Being followed by cardiology.  PLAN:    Continue fluconazole dosed per pharmacy Discussed with orthopedics yesterday.  Likely will need a more prolonged antifungal course given her complicated history. Continue wound care, glycemic control, routine lab monitoring. Check inflammatory markers and x-ray of the foot.  May need MRI to further evaluate. Will follow   Principal Problem:   Septic olecranon bursitis of right elbow Active Problems:   Hyperlipidemia   GERD   Chronic combined systolic and diastolic CHF (congestive heart failure) (HCC)   Chronic kidney disease, stage 3b (HCC)   Pulmonary nodules   Hypothyroidism    Rheumatoid arthritis (HCC)   Tracheobronchomalacia   Cellulitis of right hand   Abscess of dorsum of right hand   Osteomyelitis of second toe of right foot (HCC)   DMII (diabetes mellitus, type 2) (HCC)   History of DVT (deep vein thrombosis)   Physical deconditioning    MEDICATIONS:    Scheduled Meds:  allopurinol  100 mg Oral Daily   arformoterol  15 mcg Nebulization BID   And   umeclidinium bromide  1 puff Inhalation Daily   cyproheptadine  8 mg Oral QPM   docusate sodium  100 mg Oral BID   famotidine  20 mg Oral Daily   fluconazole  200 mg Oral Daily   fluticasone  2 spray Each Nare Daily   folic acid  1 mg Oral Daily   gabapentin  600 mg Oral QHS   guaiFENesin  600 mg Oral BID   insulin aspart  0-15 Units Subcutaneous TID WC   insulin aspart  0-5 Units Subcutaneous QHS   insulin aspart  3 Units Subcutaneous TID WC   insulin detemir  5 Units Subcutaneous QHS   loratadine  10 mg Oral Daily   magnesium oxide  400 mg Oral Daily   mirtazapine  30 mg Oral QHS   montelukast  10 mg Oral Daily   multivitamin with minerals  1 tablet Oral Daily   pantoprazole  40 mg Oral QPM   polyethylene glycol  17 g Oral BID   predniSONE  12 mg Oral Q breakfast   rivaroxaban  20 mg Oral Q supper   sacubitril-valsartan  1 tablet Oral BID   tetrahydrozoline  1 drop Both Eyes BID  Continuous Infusions: PRN Meds:.acetaminophen, albuterol, azelastine, bisacodyl, dextromethorphan, diclofenac Sodium, ondansetron **OR** ondansetron (ZOFRAN) IV, polyethylene glycol, senna, traMADol  SUBJECTIVE:   24 hour events:  LFTs improved today WBC elevated but stable No fevers Creat 1.2 No new images No new micro.  OR cx with only C albicans for now  She complains today of increasing pain in her right arm as well as significant pain in her right foot.  No fevers or chills, no abdominal pain.  No other new complaints.  Review of Systems  All other systems reviewed and are negative.    OBJECTIVE:    Blood pressure (!) 142/66, pulse 89, temperature 98.7 F (37.1 C), temperature source Oral, resp. rate 18, height '5\' 1"'$  (1.549 m), weight 63.5 kg, SpO2 96 %. Body mass index is 26.45 kg/m.  Physical Exam Constitutional:      Comments: Chronically ill-appearing woman, lying in her hospital bed without acute distress.  HENT:     Head: Normocephalic and atraumatic.  Pulmonary:     Effort: Pulmonary effort is normal. No respiratory distress.  Musculoskeletal:     Comments: Her right upper extremity is wrapped in an Ace bandage from her elbow to her hand. Her right foot was also wrapped in Ace bandage.  This was uncovered.  She was quite tender to slight palpation over the top of her foot.  There was some slight erythema, however, no fluctuance or drainage.  Skin:    General: Skin is warm and dry.  Neurological:     General: No focal deficit present.     Mental Status: She is oriented to person, place, and time.  Psychiatric:        Mood and Affect: Mood normal.        Behavior: Behavior normal.     Lab Results: Lab Results  Component Value Date   WBC 17.6 (H) 09/28/2020   HGB 10.2 (L) 09/28/2020   HCT 33.7 (L) 09/28/2020   MCV 108.4 (H) 09/28/2020   PLT 252 09/28/2020    Lab Results  Component Value Date   NA 140 09/28/2020   K 4.5 09/28/2020   CO2 22 09/28/2020   GLUCOSE 136 (H) 09/28/2020   BUN 44 (H) 09/28/2020   CREATININE 1.22 (H) 09/28/2020   CALCIUM 8.9 09/28/2020   GFRNONAA 47 (L) 09/28/2020   GFRAA 40 (L) 08/25/2020    Lab Results  Component Value Date   ALT 34 09/28/2020   AST 32 09/28/2020   ALKPHOS 71 09/28/2020   BILITOT 0.5 09/28/2020       Component Value Date/Time   CRP 17.1 (H) 03/08/2018 0310       Component Value Date/Time   ESRSEDRATE 41 (H) 07/29/2018 1014     I have reviewed the micro and lab results in Epic.  Imaging: No results found.   Imaging independently reviewed in Epic.    Raynelle Highland for  Infectious Disease [facility] [phone] pager 09/28/2020, 8:22 AM  I spent greater than 35 minutes with the patient including greater than 50% of time in face to face counsel of the patient and in coordination of their care.

## 2020-09-28 NOTE — Progress Notes (Addendum)
Progress Note  Patient Name: Ebony Scott Date of Encounter: 09/28/2020  [facility] Cardiologist: Buford Dresser, MD  Subjective   No acute overnight events. No improvement from yesterday. Patient very fatigued/tired. Still complaining of shortness of breath but unchanged. No chest pain.  Inpatient Medications    Scheduled Meds:  allopurinol  100 mg Oral Daily   arformoterol  15 mcg Nebulization BID   And   umeclidinium bromide  1 puff Inhalation Daily   cyproheptadine  8 mg Oral QPM   docusate sodium  100 mg Oral BID   famotidine  20 mg Oral Daily   fluconazole  200 mg Oral Daily   fluticasone  2 spray Each Nare Daily   folic acid  1 mg Oral Daily   gabapentin  600 mg Oral QHS   guaiFENesin  600 mg Oral BID   insulin aspart  0-15 Units Subcutaneous TID WC   insulin aspart  0-5 Units Subcutaneous QHS   insulin aspart  3 Units Subcutaneous TID WC   insulin detemir  5 Units Subcutaneous QHS   loratadine  10 mg Oral Daily   magnesium oxide  400 mg Oral Daily   mirtazapine  30 mg Oral QHS   montelukast  10 mg Oral Daily   multivitamin with minerals  1 tablet Oral Daily   pantoprazole  40 mg Oral QPM   polyethylene glycol  17 g Oral BID   predniSONE  12 mg Oral Q breakfast   rivaroxaban  20 mg Oral Q supper   sacubitril-valsartan  1 tablet Oral BID   tetrahydrozoline  1 drop Both Eyes BID   Continuous Infusions:  PRN Meds: acetaminophen, albuterol, azelastine, bisacodyl, dextromethorphan, diclofenac Sodium, ondansetron **OR** ondansetron (ZOFRAN) IV, polyethylene glycol, senna, traMADol   Vital Signs    Vitals:   09/27/20 1931 09/27/20 1949 09/28/20 0641 09/28/20 0922  BP: 118/77  (!) 142/66   Pulse: 86  89   Resp: 20  18   Temp: 98.4 F (36.9 C)  98.7 F (37.1 C)   TempSrc: Oral  Oral   SpO2: 97% 96% 96% 95%  Weight:      Height:        Intake/Output Summary (Last 24 hours) at 09/28/2020 1417 Last data filed at 09/27/2020 2100 Gross per 24 hour   Intake 2015 ml  Output --  Net 2015 ml   Last 3 Weights 09/22/2020 09/06/2020 08/30/2020  Weight (lbs) 140 lb 143 lb 144 lb  Weight (kg) 63.504 kg 64.864 kg 65.318 kg      Telemetry   Normal sinus rhythm with rates in the 80s. - Personally Reviewed  ECG    No new ECG tracing today. - Personally Reviewed  Physical Exam   GEN: No acute distress.   Neck: No JVD. Cardiac: RRR. No murmurs, rubs, or gallops. Respiratory: No increased work of breathing. Fainted crackles in bases.  GI: Soft, non-distended, and non-tender. MS: No lower extremity edema.  Skin: Warm and dry. Neuro:  No focal deficits. Psych: Normal affect. Responds appropriately.  Labs    High Sensitivity Troponin:  No results for input(s): TROPONINIHS in the last 720 hours.    Chemistry Recent Labs  Lab 09/22/20 1302 09/22/20 2352 09/23/20 0010 09/24/20 0618 09/26/20 0457 09/27/20 0503 09/28/20 0528  NA 138  --  136   < > 138 139 140  K 3.9  --  5.3*   < > 3.5 4.3 4.5  CL 98  --  98   < > 102 105 109  CO2 27  --  25   < > '25 24 22  '$ GLUCOSE 157*  --  175*   < > 156* 108* 136*  BUN 29*  --  28*   < > 53* 48* 44*  CREATININE 1.59*   < > 1.59*   < > 1.47* 1.35* 1.22*  CALCIUM 10.0  --  9.3   < > 8.5* 8.7* 8.9  PROT 6.5  --  6.2*  --   --   --  5.6*  ALBUMIN 3.3*  --  3.0*  --   --   --  2.4*  AST 53*  --  62*  --   --   --  32  ALT 72*  --  60*  --   --   --  34  ALKPHOS 82  --  75  --   --   --  71  BILITOT 1.1  --  1.9*  --   --   --  0.5  GFRNONAA 34*   < > 34*   < > 38* 42* 47*  ANIONGAP 13  --  13   < > '11 10 9   '$ < > = values in this interval not displayed.     Hematology Recent Labs  Lab 09/26/20 0457 09/27/20 0503 09/28/20 0528  WBC 15.8* 16.7* 17.6*  RBC 3.35* 3.39* 3.11*  HGB 11.0* 11.3* 10.2*  HCT 35.8* 36.6 33.7*  MCV 106.9* 108.0* 108.4*  MCH 32.8 33.3 32.8  MCHC 30.7 30.9 30.3  RDW 16.4* 16.7* 16.7*  PLT 238 266 252    BNP Recent Labs  Lab 09/22/20 1426  BNP 424.3*      DDimer No results for input(s): DDIMER in the last 168 hours.   Radiology    No results found.  Cardiac Studies   Echocardiogram 09/16/2020:  1. Septal apical and anterior wall hypokinesis EF decreased compard to  TTE done 07/10/19 . Left ventricular ejection fraction, by estimation, is  35 to 40%. The left ventricle has moderately decreased function. The left  ventricle demonstrates regional  wall motion abnormalities (see scoring diagram/findings for description).  The left ventricular internal cavity size was moderately dilated. Left  ventricular diastolic parameters are consistent with Grade II diastolic  dysfunction (pseudonormalization).  Elevated left ventricular end-diastolic pressure.   2. Right ventricular systolic function is normal. The right ventricular  size is normal.   3. Left atrial size was mildly dilated.   4. The mitral valve is abnormal. Mild mitral valve regurgitation. No  evidence of mitral stenosis.   5. The aortic valve is tricuspid. There is moderate calcification of the  aortic valve. Aortic valve regurgitation is not visualized. Mild to  moderate aortic valve sclerosis/calcification is present, without any  evidence of aortic stenosis.   6. The inferior vena cava is normal in size with greater than 50%  respiratory variability, suggesting right atrial pressure of 3 mmHg.  Patient Profile     70 y.o. female with a history of diffuse aorta and coronary calcifications noted on chest CT in Q000111Q, chronic diastolic CHF, SVT, prior PE on lifelong anticoagulation, asthma, tracheobronchomalacia on CPAP, bronchiectasis, vocal cord dysfunction, seronegative rheumatoid arthritis, and CKD stage III. Patient was admitted for septic right elbow olecranon bursitis and cellulitis of right hand. S/p I&D on 09/22/2020. Cardiology consulted for further evaluation of recent outpatient Echo which showed newly reduced EF of 35-40% with new wall motion  abnormalities.  Assessment & Plan    Chronic Heart Failure with Newly Reduced EF - Echo on 09/16/2020 showed LVEF of 35-40% with septal apical and anterior wall hypokinesis which are new. EF previously >75% in 2021. - BNP elevated at 424. - Chest x-ray showed no acute findings. - Initially on IV Lasix but this was stopped on 8/8 due to slight rise in creatinine. Net positive 1.48L this admission. No updated weights. Renal function stable. - Does not appear significantly volume overloaded on exam.  - Entresto increased to 49-'51mg'$  twice daily yesterday evening. Has only received one dose so fat but BP and renal function tolerating well. - Will start Bisoprolol 2.'5mg'$  daily. - Initially started on Farxiga but this was stopped due to active fungal infection. - Patient will ultimately need an ischemic evaluation  Prior chest CT showed diffuse coronary calcifications. Plan is for outpatient cardiac catheterization once patient recovers from acute infection.   Hypertension - BP mostly well controlled. - Continue medications for CHF as above.   Hyperlipidemia - Lipid panel this admission: Total Cholesterol 318, Triglycerides 475, HDL 38. LDL could not be calculated due to Triglyceride level. Direct LDL pending. Last LDL I can see in our system was 132 in 2018. - LDL goal <70 given CAD. - Lipitor increased to '80mg'$  daily this admission.  - Will need repeat lipid panel and LFTs in 6-8 weeks.   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   CKD Stage III - Creatinine slowly improving over the last couple of days an 1.22 today. - Continue to monior.   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  I have reviewed the chart, notes and new data.  I agree with PA/NP's note.  Key new complaints: no appetite. Sleepy (no pain meds today). Dyspnea, but lying almost flat in bed. Denies angina. Key examination changes: mild JVD, no gallop, no rub, no murmur. No edema. Bilateral basal atelectatic lung crackles. Key new findings / data: creatinine is back to baseline. Albumin 2.4  PLAN: Resume oral loop diuretic. Started low dose beta blocker. Will need coronary angio when acute infectious problems have resolved.  Sanda Klein, MD, [place] [phone] 09/28/2020, 3:58 PM

## 2020-09-28 NOTE — Progress Notes (Signed)
[facility] PROGRESS NOTE    Progress Note  Ebony Scott  TGY:[id number] DOB: 04/07/1950 DOA: 09/22/2020 PCP: [name], MD     Brief Narrative:   Ebony Scott is an 70 y.o. female past medical history of DVT anxiety, rheumatoid arthritis breast cancer C. difficile referral vascular disease tracheal bronchomalacia who presents with right hand pain and swelling, seen by Ortho and had multiple I&D 1 being 3 weeks ago since then she is noticing increased swelling and erythema of the arm, Ortho admitted her for debridement of her right hand and elbow perform I&D on 09/22/2020 due to septic olecranon bursitis with cultures growing Candida albicans susceptible to fluconazole ID was consulted for inpatient assistance  Assessment/Plan:   Septic olecranon bursitis of right elbow: ID on board cultures grew Candida Albicans (in setting of steroids) on fluconazole  Underwent I&D on 09/22/2021. Continues to have an elevated white blood cell count but has remained afebrile further management per ID. Candida susceptibility sent to [facility].  LFTs are improved.  Abscess of the dorsum of the right hand: Status post excisional debridement and I&D of the third MCP. ID on board they recommended to discontinue daptomycin continue fluconazole dressing changes per Ortho. Will need follow dressing changes by Ortho prior to DC.  Chronic combined systolic and diastolic heart failure: With a recent 2D echo on 09/16/2020 that showed a reduced EF of 35%. Currently on Entresto, was being diuresed but her creatinine bumped and Lasix was held.  She is also on low-dose bisoprolol and continue to titrated heart failure medications per cardiology. She will eventually need ischemic evaluation as an outpatient once infection is resolved.  Physical deconditioning: Her goal is to go home with home health, but physical therapy and Occupational Therapy have evaluated the patient they recommended home health  PT.  History of DVT: Continue lifelong Xarelto.  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 Follow-up with podiatrist as an outpatient. Continue Betadine wet-to-dry dressing changes.  Rheumatoid arthritis: Treated with Rituxan and prednisone daily.  Hyperlipidemia: Continue Lipitor.  Diabetes mellitus type 2: With a last A1c of 8.4, Farxiga was held in the setting of yeast infection. She is currently on long-acting insulin plus sliding scale blood glucose continue to be erratic in the setting of steroids.  History of tracheomalacia: Continue Stiltol.  Pulmonary nodule: Follow-up with pulmonary as an outpatient status post BAL on 04/05/2020 that grew Candida albicans. Repeated CT on 05/25/2020 showed improvement in decreased size of nodule.  Chronic kidney disease stage IIIb: With a baseline creatinine around 1.2 on admission 1.6. Now returning to baseline.   DVT prophylaxis: Xarelto Family Communication:Daughter Status is: Inpatient  Remains inpatient appropriate because:Hemodynamically unstable  Dispo: The patient is from: Home              Anticipated d/c is to: Home              Patient currently is not medically stable to d/c.   Difficult to place patient No   Code Status:     Code Status Orders  (From admission, onward)           Start     Ordered   09/22/20 1647  Do not attempt resuscitation (DNR)  Continuous       Question Answer Comment  In the event of cardiac or respiratory ARREST Do not call a "code blue"   In the event of cardiac or respiratory ARREST Do not perform  Intubation, CPR, defibrillation or ACLS   In the event of cardiac or respiratory ARREST Use medication by any route, position, wound care, and other measures to relive pain and suffering. May use oxygen, suction and manual treatment of airway obstruction as needed for comfort.      09/22/20 1646           Code Status History     Date  Active Date Inactive Code Status Order ID Comments User Context   02/23/2020 1504 02/29/2020 2009 Full Code [id number]  Lequita Halt, MD ED   02/16/2020 1420 02/19/2020 1914 Full Code [id number]  Ventura Bruns, PA-C Inpatient   03/06/2018 1827 03/09/2018 1837 Full Code [id number]  Nita Sells, MD Inpatient   02/10/2018 1659 02/12/2018 1941 Full Code [id number]  Kerney Elbe, DO ED   01/15/2018 0828 01/23/2018 2110 Full Code [id number]  Cathlyn Parsons, PA-C Inpatient   01/15/2018 0828 01/15/2018 0828 Full Code [id number]  Cathlyn Parsons, PA-C Inpatient   01/14/2018 2027 01/15/2018 0827 Full Code [id number]  Reubin Milan, MD ED   12/13/2017 1706 12/25/2017 0103 Full Code [id number]  Reubin Milan, MD ED   04/15/2016 2127 04/17/2016 1943 Full Code [id number]  Vianne Bulls, MD ED   02/20/2012 1729 02/26/2012 1540 Full Code [id number]  Payton Emerald, RN Inpatient      Advance Directive Documentation    Flowsheet Row Most Recent Value  Type of Advance Directive Healthcare Power of Attorney, Living will  Pre-existing out of facility DNR order (yellow form or pink MOST form) --  "MOST" Form in Place? --         IV Access:   Peripheral IV   Procedures and diagnostic studies:   No results found.   Medical Consultants:   None.   Subjective:    Ebony Scott pain control has not had a bowel movement and she was work with physical therapy.  Objective:    Vitals:   09/27/20 0815 09/27/20 1931 09/27/20 1949 09/28/20 0641  BP:  118/77  (!) 142/66  Pulse:  86  89  Resp:  20  18  Temp:  98.4 F (36.9 C)  98.7 F (37.1 C)  TempSrc:  Oral  Oral  SpO2: 97% 97% 96% 96%  Weight:      Height:       SpO2: 96 % O2 Flow Rate (L/min): 2 L/min   Intake/Output Summary (Last 24 hours) at 09/28/2020 0715 Last data filed at 09/27/2020 2100 Gross per 24 hour  Intake 2375 ml  Output --  Net 2375 ml   Filed Weights   09/22/20 1211  Weight: 63.5 kg     Exam: General exam: In no acute distress. Respiratory system: Good air movement and clear to auscultation. Cardiovascular system: S1 & S2 heard, RRR. No JVD. Gastrointestinal system: Abdomen is nondistended, soft and nontender.  Extremities: No pedal edema. Skin: No rashes, lesions or ulcers Psychiatry: Judgement and insight appear normal. Mood & affect appropriate.    Data Reviewed:    Labs: Basic Metabolic Panel: Recent Labs  Lab 09/23/20 0010 09/24/20 0618 09/25/20 0545 09/26/20 0457 09/27/20 0503  NA 136 137 139 138 139  K 5.3* 4.2 4.0 3.5 4.3  CL 98 102 105 102 105  CO2 _0 GLUCOSE 175* 223* 170* 156* 108*  BUN 28* 41* 42* 53* 48*  CREATININE 1.59* 1.47* 1.31* 1.47* 1.35*  CALCIUM 9.3 8.4* 8.5* 8.5* 8.7*  MG  --  2.2 2.2 2.2 2.4   GFR Estimated Creatinine Clearance: 32.2 mL/min (A) (by C-G formula based on SCr of 1.35 mg/dL (H)). Liver Function Tests: Recent Labs  Lab 09/22/20 1302 09/23/20 0010  AST 53* 62*  ALT 72* 60*  ALKPHOS 82 75  BILITOT 1.1 1.9*  PROT 6.5 6.2*  ALBUMIN 3.3* 3.0*   No results for input(s): LIPASE, AMYLASE in the last 168 hours. No results for input(s): AMMONIA in the last 168 hours. Coagulation profile Recent Labs  Lab 09/22/20 1302  INR 1.0   COVID-19 Labs  Recent Labs    09/26/20 0848  FERRITIN 102    Lab Results  Component Value Date   SARSCOV2NAA NEGATIVE 09/22/2020   [place] NEGATIVE 04/02/2020   [place] NEGATIVE 02/23/2020   [place] NEGATIVE 02/15/2020    CBC: Recent Labs  Lab 09/24/20 0618 09/25/20 0545 09/26/20 0457 09/27/20 0503 09/28/20 0528  WBC 17.2* 18.6* 15.8* 16.7* 17.6*  NEUTROABS 14.7* 15.0* 11.9* 12.3* 13.2*  HGB 10.7* 11.3* 11.0* 11.3* 10.2*  HCT 34.5* 36.1 35.8* 36.6 33.7*  MCV 106.5* 105.9* 106.9* 108.0* 108.4*  PLT 224 246 238 266 252   Cardiac Enzymes: Recent Labs  Lab 09/24/20 0618 09/26/20 0457  CKTOTAL 649* 232   BNP (last 3 results) No  results for input(s): PROBNP in the last 8760 hours. CBG: Recent Labs  Lab 09/26/20 2132 09/27/20 0736 09/27/20 1206 09/27/20 1740 09/27/20 2143  GLUCAP 235* 118* 205* 250* 203*   D-Dimer: No results for input(s): DDIMER in the last 72 hours. Hgb A1c: No results for input(s): HGBA1C in the last 72 hours. Lipid Profile: No results for input(s): CHOL, HDL, LDLCALC, TRIG, CHOLHDL, LDLDIRECT in the last 72 hours. Thyroid function studies: No results for input(s): TSH, T4TOTAL, T3FREE, THYROIDAB in the last 72 hours.  Invalid input(s): FREET3 Anemia work up: Recent Labs    09/26/20 0848  VITAMINB12 1,486*  FOLATE 37.6  FERRITIN 102  TIBC 367  IRON 39   Sepsis Labs: Recent Labs  Lab 09/22/20 1302 09/22/20 2352 09/23/20 0010 09/25/20 0545 09/26/20 0457 09/27/20 0503 09/28/20 0528  WBC 14.5* 16.7*   < > 18.6* 15.8* 16.7* 17.6*  LATICACIDVEN 1.6 1.3  --   --   --   --   --    < > = values in this interval not displayed.   Microbiology Recent Results (from the past 240 hour(s))  Blood Culture (routine x 2)     Status: None   Collection Time: 09/22/20  1:03 PM   Specimen: BLOOD  Result Value Ref Range Status   Specimen Description   Final    BLOOD RIGHT ANTECUBITAL Performed at [facility] [address], [place], [place] [address]    Special Requests   Final    BOTTLES DRAWN AEROBIC AND ANAEROBIC Blood Culture adequate volume Performed at [facility] [address], [place], [place] [address]    Culture   Final    NO GROWTH 5 DAYS Performed at [facility], [place] [address] [place], [place] [address]    Report Status 09/27/2020 FINAL  Final  Resp Panel by RT-PCR (Flu A&B, Covid) Nasopharyngeal Swab     Status: None   Collection Time: 09/22/20  1:04 PM   Specimen: Nasopharyngeal Swab; Nasopharyngeal(NP) swabs in vial transport medium  Result Value Ref Range Status   SARS Coronavirus 2 by RT PCR NEGATIVE NEGATIVE  Final    Comment: (NOTE) SARS-CoV-2 target nucleic acids are  NOT DETECTED.  The SARS-CoV-2 RNA is generally detectable in upper respiratory specimens during the acute phase of infection. The lowest concentration of SARS-CoV-2 viral copies this assay can detect is 138 copies/mL. A negative result does not preclude SARS-Cov-2 infection and should not be used as the sole basis for treatment or other patient management decisions. A negative result may occur with  improper specimen collection/handling, submission of specimen other than nasopharyngeal swab, presence of viral mutation(s) within the areas targeted by this assay, and inadequate number of viral copies(<138 copies/mL). A negative result must be combined with clinical observations, patient history, and epidemiological information. The expected result is Negative.  Fact Sheet for Patients:  EntrepreneurPulse.com.au  Fact Sheet for Healthcare Providers:  IncredibleEmployment.be  This test is no t yet approved or cleared by the [place] FDA and  has been authorized for detection and/or diagnosis of SARS-CoV-2 by FDA under an Emergency Use Authorization (EUA). This EUA will remain  in effect (meaning this test can be used) for the duration of the COVID-19 declaration under Section 564(b)(1) of the Act, 21 U.S.C.section 360bbb-3(b)(1), unless the authorization is terminated  or revoked sooner.       Influenza A by PCR NEGATIVE NEGATIVE Final   Influenza B by PCR NEGATIVE NEGATIVE Final    Comment: (NOTE) The Xpert Xpress SARS-CoV-2/FLU/RSV plus assay is intended as an aid in the diagnosis of influenza from Nasopharyngeal swab specimens and should not be used as a sole basis for treatment. Nasal washings and aspirates are unacceptable for Xpert Xpress SARS-CoV-2/FLU/RSV testing.  Fact Sheet for Patients: EntrepreneurPulse.com.au  Fact Sheet for Healthcare  Providers: IncredibleEmployment.be  This test is not yet approved or cleared by the [place] FDA and has been authorized for detection and/or diagnosis of SARS-CoV-2 by FDA under an Emergency Use Authorization (EUA). This EUA will remain in effect (meaning this test can be used) for the duration of the COVID-19 declaration under Section 564(b)(1) of the Act, 21 U.S.C. section 360bbb-3(b)(1), unless the authorization is terminated or revoked.  Performed at [facility], [place] [address] [place], [place] [address]   Blood Culture (routine x 2)     Status: None   Collection Time: 09/22/20  1:08 PM   Specimen: BLOOD  Result Value Ref Range Status   Specimen Description   Final    BLOOD LEFT ANTECUBITAL Performed at [facility] [address] [place], [place] [address]    Special Requests   Final    BOTTLES DRAWN AEROBIC AND ANAEROBIC Blood Culture adequate volume Performed at [facility] [address], [place], [place] [address]    Culture   Final    NO GROWTH 5 DAYS Performed at [facility], [place] [address] [place], [place] [address]    Report Status 09/27/2020 FINAL  Final  Fungus Culture With Stain     Status: None (Preliminary result)   Collection Time: 09/22/20 10:05 PM   Specimen: Synovial, Right Elbow; Tissue  Result Value Ref Range Status   Fungus Stain Final report  Final    Comment: (NOTE) Performed At: [facility] [address] [place], [place] [id number] Rush Farmer MD IO:[id number]    Fungus (Mycology) Culture PENDING  Incomplete   Fungal Source HAND  Final    Comment: Performed at [facility], [place] [address] [place], [place] [address]  Aerobic/Anaerobic Culture w Gram Stain (surgical/deep wound)     Status: None (Preliminary result)  Collection Time: 09/22/20 10:05 PM   Specimen: Synovial, Right Elbow; Tissue  Result Value Ref Range  Status   Specimen Description   Final    SYNOVIAL Performed at [facility], [place] [address] [place], [place] [address]    Special Requests   Final    NONE Performed at [facility], [place] [address], [place], [place] [address]    Gram Stain   Final    RARE WBC PRESENT,BOTH PMN AND MONONUCLEAR NO ORGANISMS SEEN    Culture   Final    RARE CANDIDA ALBICANS NO ANAEROBES ISOLATED; CULTURE IN PROGRESS FOR 5 DAYS CRITICAL RESULT CALLED TO, READ BACK BY AND VERIFIED WITH: DR. Mardelle Matte VIA EPIC [id number] FCP Sent to [place] for further susceptibility testing. Performed at [facility], [place] [address] [place], [place] [address]    Report Status PENDING  Incomplete  Fungus Stain     Status: None   Collection Time: 09/22/20 10:05 PM   Specimen: Synovial, Right Elbow; Tissue  Result Value Ref Range Status   FUNGUS STAIN DUPLICATE  Final    Comment: (NOTE) Duplicate procedure ordered.      Refer to [id number] for results. Performed At: [facility] [place], [place] [id number] Rush Farmer MD YH:[id number]    Fungal Source SYNOVIAL  Final    Comment: Performed at [facility], [place] [address], [place], [place] [address]  Acid Fast Smear (AFB)     Status: None   Collection Time: 09/22/20 10:05 PM   Specimen: Synovial, Right Elbow; Tissue  Result Value Ref Range Status   AFB Specimen Processing Comment  Final    Comment: Tissue Grinding and Digestion/Decontamination   Acid Fast Smear Negative  Final    Comment: (NOTE) Performed At: [facility] [address] [place], [place] [id number] Rush Farmer MD PO:[id number]    Source (AFB) HAND  Final    Comment: Performed at [facility], [place] [address], [place], [place] [address]  Fungus Culture With Stain     Status: Abnormal (Preliminary result)   Collection Time: 09/22/20 10:05 PM   Specimen: Hand, Right; Body Fluid   Result Value Ref Range Status   Fungus Stain Final report (A)  Final    Comment: (NOTE) POSITIVE RESULT REPORTED TO CAMELIA M AT 01:43PM ON 09/27/20 BY SM. FAX #: [phone] Performed At: [facility] [address] [place], [place] [id number] Rush Farmer MD ID:[id number]    Fungus (Mycology) Culture PENDING  Incomplete   Fungal Source SYNOVIAL  Final    Comment: Performed at [facility], [place] [address], [place], [place] [address]  Aerobic/Anaerobic Culture w Gram Stain (surgical/deep wound)     Status: None (Preliminary result)   Collection Time: 09/22/20 10:05 PM   Specimen: Hand, Right; Body Fluid  Result Value Ref Range Status   Specimen Description   Final    HAND Performed at [facility], [place] [address] [place], [place] [address]    Special Requests   Final    NONE Performed at [facility], [place] [address] [place], [place] [address]    Gram Stain   Final    RARE WBC PRESENT, PREDOMINANTLY PMN NO ORGANISMS SEEN Performed at [facility], [place] [address], [place], [place] [address]    Culture   Final    RARE CANDIDA ALBICANS NO ANAEROBES ISOLATED; CULTURE IN PROGRESS FOR 5 DAYS  Report Status PENDING  Incomplete  Acid Fast Smear (AFB)     Status: None   Collection Time: 09/22/20 10:05 PM   Specimen: Hand, Right; Body Fluid  Result Value Ref Range Status   AFB Specimen Processing Comment  Final    Comment: Tissue Grinding and Digestion/Decontamination   Acid Fast Smear Negative  Final    Comment: (NOTE) Performed At: [facility] [place], [place] [id number] Rush Farmer MD EB:[id number]    Source (AFB) SYNOVIAL  Final    Comment: Performed at [facility], [place] [address], [place], [place] [address]  Fungus Culture Result     Status: None   Collection Time: 09/22/20 10:05 PM  Result Value Ref Range Status   Result 1 Comment  Final     Comment: (NOTE) KOH/Calcofluor preparation:  no fungus observed. Performed At: [facility] [place], [place] [id number] Rush Farmer MD FY:[id number]   Fungus Culture Result     Status: Abnormal   Collection Time: 09/22/20 10:05 PM  Result Value Ref Range Status   Result 1 Comment (A)  Final    Comment: (NOTE) Pseudohyphae and yeasts observed Performed At: [facility] [place], [place] [id number] Rush Farmer MD UX:[id number]      Medications:    allopurinol  100 mg Oral Daily   arformoterol  15 mcg Nebulization BID   And   umeclidinium bromide  1 puff Inhalation Daily   cyproheptadine  8 mg Oral QPM   docusate sodium  100 mg Oral BID   famotidine  20 mg Oral Daily   fluconazole  200 mg Oral Daily   fluticasone  2 spray Each Nare Daily   folic acid  1 mg Oral Daily   gabapentin  600 mg Oral QHS   guaiFENesin  600 mg Oral BID   insulin aspart  0-15 Units Subcutaneous TID WC   insulin aspart  0-5 Units Subcutaneous QHS   loratadine  10 mg Oral Daily   magnesium oxide  400 mg Oral Daily   mirtazapine  30 mg Oral QHS   montelukast  10 mg Oral Daily   multivitamin with minerals  1 tablet Oral Daily   pantoprazole  40 mg Oral QPM   predniSONE  15 mg Oral Q breakfast   rivaroxaban  20 mg Oral Q supper   sacubitril-valsartan  1 tablet Oral BID   tetrahydrozoline  1 drop Both Eyes BID   Continuous Infusions:    LOS: 6 days   Charlynne Cousins  [facility]  09/28/2020, 7:15 AM

## 2020-09-28 NOTE — Progress Notes (Signed)
PIV consult: Line infiltrated per RN, no current IV meds ordered. For vein preservation and infection prevention purposes, recommend waiting until IV meds are ordered to place best line for ordered therapy. Message to RN.

## 2020-09-28 NOTE — Discharge Instructions (Signed)
Diet: As you were doing prior to hospitalization   Shower:  May shower but keep the wounds dry, use an occlusive plastic wrap, NO SOAKING IN TUB.  If the bandage gets wet, change with a clean dry gauze.    Dressing:  Ok to change every 24-48 hours at home with new xeroform, 4x4 gauze, abd's, and gauze wrap with posterior splint over it.   Activity:  Increase activity slowly as tolerated, but follow the weight bearing instructions below.  The rules on driving is that you can not be taking narcotics while you drive, and you must feel in control of the vehicle.    Weight Bearing:   Weight bearing as tolerated RUE.   To prevent constipation: you may use a stool softener such as -  Colace (over the counter) 100 mg by mouth twice a day  Drink plenty of fluids (prune juice may be helpful) and high fiber foods Miralax (over the counter) for constipation as needed.    Itching:  If you experience itching with your medications, try taking only a single pain pill, or even half a pain pill at a time.  You may take up to 10 pain pills per day, and you can also use benadryl over the counter for itching or also to help with sleep.   Precautions:  If you experience chest pain or shortness of breath - call 911 immediately for transfer to the hospital emergency department!!  If you develop a fever greater that 101 F, purulent drainage from wound, increased redness or drainage from wound, or calf pain -- Call the office at [phone]                                                Follow- Up Appointment:  Please call for an appointment to be seen in 2 weeks [place] - [phone]

## 2020-09-28 NOTE — Progress Notes (Signed)
Subjective: 6 Days Post-Op s/p Procedure(s): IRRIGATION AND DEBRIDEMENT RIGHT HAND AND ELBOW   Patient is alert, oriented. Says she feels down, she hasn't completely weaned off of strong pain medication. Reports pain as moderate-severe to toe but only mild at right elbow and 3rd MCP joint.  Denies chest pain, Calf pain. No nausea/vomiting. No other complaints.  Objective:  PE: VITALS:   Vitals:   09/27/20 0815 09/27/20 1931 09/27/20 1949 09/28/20 0641  BP:  118/77  (!) 142/66  Pulse:  86  89  Resp:  20  18  Temp:  98.4 F (36.9 C)  98.7 F (37.1 C)  TempSrc:  Oral  Oral  SpO2: 97% 97% 96% 96%  Weight:      Height:       General: sitting up in bed, in no acute distres MSK: RUE in splint. Able to flex and extend all fingers of right hand, no significant erythema or edema. Distal sensation and capillary refill intact.    LABS  Results for orders placed or performed during the hospital encounter of 09/22/20 (from the past 24 hour(s))  Glucose, capillary     Status: Abnormal   Collection Time: 09/27/20 12:06 PM  Result Value Ref Range   Glucose-Capillary 205 (H) 70 - 99 mg/dL   Comment 1 Notify RN    Comment 2 Document in Chart   Glucose, capillary     Status: Abnormal   Collection Time: 09/27/20  5:40 PM  Result Value Ref Range   Glucose-Capillary 250 (H) 70 - 99 mg/dL   Comment 1 Notify RN    Comment 2 Document in Chart   Glucose, capillary     Status: Abnormal   Collection Time: 09/27/20  9:43 PM  Result Value Ref Range   Glucose-Capillary 203 (H) 70 - 99 mg/dL  Basic metabolic panel     Status: Abnormal   Collection Time: 09/28/20  5:28 AM  Result Value Ref Range   Sodium 140 135 - 145 mmol/L   Potassium 4.5 3.5 - 5.1 mmol/L   Chloride 109 98 - 111 mmol/L   CO2 22 22 - 32 mmol/L   Glucose, Bld 136 (H) 70 - 99 mg/dL   BUN 44 (H) 8 - 23 mg/dL   Creatinine, Ser 1.22 (H) 0.44 - 1.00 mg/dL   Calcium 8.9 8.9 - 10.3 mg/dL   GFR, Estimated 47 (L) >60 mL/min    Anion gap 9 5 - 15  CBC with Differential/Platelet     Status: Abnormal   Collection Time: 09/28/20  5:28 AM  Result Value Ref Range   WBC 17.6 (H) 4.0 - 10.5 K/uL   RBC 3.11 (L) 3.87 - 5.11 MIL/uL   Hemoglobin 10.2 (L) 12.0 - 15.0 g/dL   HCT 33.7 (L) 36.0 - 46.0 %   MCV 108.4 (H) 80.0 - 100.0 fL   MCH 32.8 26.0 - 34.0 pg   MCHC 30.3 30.0 - 36.0 g/dL   RDW 16.7 (H) 11.5 - 15.5 %   Platelets 252 150 - 400 K/uL   nRBC 0.0 0.0 - 0.2 %   Neutrophils Relative % 74 %   Neutro Abs 13.2 (H) 1.7 - 7.7 K/uL   Lymphocytes Relative 7 %   Lymphs Abs 1.2 0.7 - 4.0 K/uL   Monocytes Relative 13 %   Monocytes Absolute 2.3 (H) 0.1 - 1.0 K/uL   Eosinophils Relative 2 %   Eosinophils Absolute 0.3 0.0 - 0.5 K/uL   Basophils Relative  1 %   Basophils Absolute 0.1 0.0 - 0.1 K/uL   Immature Granulocytes 3 %   Abs Immature Granulocytes 0.55 (H) 0.00 - 0.07 K/uL  Magnesium     Status: None   Collection Time: 09/28/20  5:28 AM  Result Value Ref Range   Magnesium 2.4 1.7 - 2.4 mg/dL  Hepatic function panel     Status: Abnormal   Collection Time: 09/28/20  5:28 AM  Result Value Ref Range   Total Protein 5.6 (L) 6.5 - 8.1 g/dL   Albumin 2.4 (L) 3.5 - 5.0 g/dL   AST 32 15 - 41 U/L   ALT 34 0 - 44 U/L   Alkaline Phosphatase 71 38 - 126 U/L   Total Bilirubin 0.5 0.3 - 1.2 mg/dL   Bilirubin, Direct 0.1 0.0 - 0.2 mg/dL   Indirect Bilirubin 0.4 0.3 - 0.9 mg/dL  Glucose, capillary     Status: Abnormal   Collection Time: 09/28/20  7:37 AM  Result Value Ref Range   Glucose-Capillary 105 (H) 70 - 99 mg/dL   Comment 1 Notify RN    Comment 2 Document in Chart    *Note: Due to a large number of results and/or encounters for the requested time period, some results have not been displayed. A complete set of results can be found in Results Review.    No results found.  Assessment/Plan: Septic right elbow olecranon bursitis Right dorsal infected rheumatoid nodule, hand, overlying the third metacarpal  phalangeal joint. 6 Days Post-Op s/p Procedure(s): IRRIGATION AND DEBRIDEMENT RIGHT HAND AND ELBOW - cultures growing candida albicans, ID following, sent off for sensitivities Weightbearing: WBAT RUE Insicional and dressing care: leave dressing intact. Ok to change every 24-48 hours at home with new xeroform, 4x4 gauze, abd's, and gauze wrap with posterior splint over it.  VTE prophylaxis: Home xarelto Pain control: continue current regimen Follow - up plan: will continue to follow, 1 week after discharge Dispo:pending, patient adamant she does not want to go to a SNF, however I think she needs to be able to show she can ambulated with PT here in the hospital before being discharged home with [facility] and PT.   Ventura Bruns 09/28/2020, 8:30 AM

## 2020-09-29 DIAGNOSIS — M71121 Other infective bursitis, right elbow: Secondary | ICD-10-CM | POA: Diagnosis not present

## 2020-09-29 LAB — AEROBIC/ANAEROBIC CULTURE W GRAM STAIN (SURGICAL/DEEP WOUND)

## 2020-09-29 LAB — GLUCOSE, CAPILLARY
Glucose-Capillary: 109 mg/dL — ABNORMAL HIGH (ref 70–99)
Glucose-Capillary: 185 mg/dL — ABNORMAL HIGH (ref 70–99)
Glucose-Capillary: 209 mg/dL — ABNORMAL HIGH (ref 70–99)

## 2020-09-29 LAB — BASIC METABOLIC PANEL
Anion gap: 13 (ref 5–15)
BUN: 45 mg/dL — ABNORMAL HIGH (ref 8–23)
CO2: 25 mmol/L (ref 22–32)
Calcium: 9.2 mg/dL (ref 8.9–10.3)
Chloride: 99 mmol/L (ref 98–111)
Creatinine, Ser: 1.83 mg/dL — ABNORMAL HIGH (ref 0.44–1.00)
GFR, Estimated: 29 mL/min — ABNORMAL LOW (ref >60)
Glucose, Bld: 214 mg/dL — ABNORMAL HIGH (ref 70–99)
Potassium: 4.4 mmol/L (ref 3.5–5.1)
Sodium: 137 mmol/L (ref 135–145)

## 2020-09-29 LAB — C-REACTIVE PROTEIN: CRP: 24.3 mg/dL — ABNORMAL HIGH (ref <1.0)

## 2020-09-29 LAB — SEDIMENTATION RATE: Sed Rate: 122 mm/hr — ABNORMAL HIGH (ref 0–22)

## 2020-09-29 NOTE — Progress Notes (Addendum)
Progress Note  Patient Name: Ebony Scott Date of Encounter: 09/29/2020  [facility] Cardiologist: Buford Dresser, MD   Subjective   Wrapped up in blankets and jacket in chair. She is SOB no chest pain  Inpatient Medications    Scheduled Meds:  allopurinol  100 mg Oral Daily   arformoterol  15 mcg Nebulization BID   And   umeclidinium bromide  1 puff Inhalation Daily   bisoprolol  2.5 mg Oral Daily   cyproheptadine  8 mg Oral QPM   docusate sodium  100 mg Oral BID   famotidine  20 mg Oral Daily   fluconazole  200 mg Oral Daily   fluticasone  2 spray Each Nare Daily   folic acid  1 mg Oral Daily   furosemide  80 mg Oral Daily   gabapentin  600 mg Oral QHS   guaiFENesin  600 mg Oral BID   insulin aspart  0-15 Units Subcutaneous TID WC   insulin aspart  0-5 Units Subcutaneous QHS   insulin aspart  3 Units Subcutaneous TID WC   insulin detemir  5 Units Subcutaneous QHS   loratadine  10 mg Oral Daily   magnesium oxide  400 mg Oral Daily   mirtazapine  30 mg Oral QHS   montelukast  10 mg Oral Daily   multivitamin with minerals  1 tablet Oral Daily   pantoprazole  40 mg Oral QPM   polyethylene glycol  17 g Oral BID   predniSONE  12 mg Oral Q breakfast   rivaroxaban  20 mg Oral Q supper   sacubitril-valsartan  1 tablet Oral BID   tetrahydrozoline  1 drop Both Eyes BID   Continuous Infusions:  PRN Meds: acetaminophen, albuterol, azelastine, bisacodyl, dextromethorphan, diclofenac Sodium, ondansetron **OR** ondansetron (ZOFRAN) IV, polyethylene glycol, senna, traMADol   Vital Signs    Vitals:   09/28/20 2057 09/29/20 0451 09/29/20 0731 09/29/20 0734  BP: (!) 118/53 (!) 105/52    Pulse: 83 75    Resp: 16 19    Temp: 98.8 F (37.1 C) (!) 97.5 F (36.4 C)    TempSrc: Oral Oral    SpO2: 100% 100% 94% 94%  Weight:      Height:        Intake/Output Summary (Last 24 hours) at 09/29/2020 0833 Last data filed at 09/28/2020 2314 Gross per 24 hour  Intake 240  ml  Output 2100 ml  Net -1860 ml   Last 3 Weights 09/22/2020 09/06/2020 08/30/2020  Weight (lbs) 140 lb 143 lb 144 lb  Weight (kg) 63.504 kg 64.864 kg 65.318 kg      Telemetry     SR- Personally Reviewed  ECG    No new - Personally Reviewed  Physical Exam   GEN: No acute distress.   Neck: No JVD sitting up in chair Cardiac: RRR, no murmurs, rubs, or gallops.  Respiratory: Clear to few crackles ant  to auscultation bilaterally. GI: Soft, nontender, non-distended  MS: No edema; Rt foot in boot  dressing on Rt hand and elbow Neuro:  Nonfocal  Psych: Normal affect   Labs    High Sensitivity Troponin:  No results for input(s): TROPONINIHS in the last 720 hours.    Chemistry Recent Labs  Lab 09/22/20 1302 09/22/20 2352 09/23/20 0010 09/24/20 0618 09/26/20 0457 09/27/20 0503 09/28/20 0528  NA 138  --  136   < > 138 139 140  K 3.9  --  5.3*   < >  3.5 4.3 4.5  CL 98  --  98   < > 102 105 109  CO2 27  --  25   < > '25 24 22  '$ GLUCOSE 157*  --  175*   < > 156* 108* 136*  BUN 29*  --  28*   < > 53* 48* 44*  CREATININE 1.59*   < > 1.59*   < > 1.47* 1.35* 1.22*  CALCIUM 10.0  --  9.3   < > 8.5* 8.7* 8.9  PROT 6.5  --  6.2*  --   --   --  5.6*  ALBUMIN 3.3*  --  3.0*  --   --   --  2.4*  AST 53*  --  62*  --   --   --  32  ALT 72*  --  60*  --   --   --  34  ALKPHOS 82  --  75  --   --   --  71  BILITOT 1.1  --  1.9*  --   --   --  0.5  GFRNONAA 34*   < > 34*   < > 38* 42* 47*  ANIONGAP 13  --  13   < > '11 10 9   '$ < > = values in this interval not displayed.     Hematology Recent Labs  Lab 09/26/20 0457 09/27/20 0503 09/28/20 0528  WBC 15.8* 16.7* 17.6*  RBC 3.35* 3.39* 3.11*  HGB 11.0* 11.3* 10.2*  HCT 35.8* 36.6 33.7*  MCV 106.9* 108.0* 108.4*  MCH 32.8 33.3 32.8  MCHC 30.7 30.9 30.3  RDW 16.4* 16.7* 16.7*  PLT 238 266 252    BNP Recent Labs  Lab 09/22/20 1426  BNP 424.3*     DDimer No results for input(s): DDIMER in the last 168 hours.   Radiology     DG Foot Complete Right  Result Date: 09/28/2020 CLINICAL DATA:  RIGHT foot pain. EXAM: RIGHT FOOT COMPLETE - 3+ VIEW COMPARISON:  Foot radiograph 07/20/2020 FINDINGS: Chronic fractures at the base of the fourth and fifth metatarsal. No acute fracture dislocation. No soft tissue abnormality. IMPRESSION: 1. No acute findings of the RIGHT foot. 2. Chronic fractures of the base of the fourth and fifth metatarsal. Electronically Signed   By: Suzy Bouchard M.D.   On: 09/28/2020 16:05    Cardiac Studies     Echocardiogram 09/16/2020:  1. Septal apical and anterior wall hypokinesis EF decreased compard to  TTE done 07/10/19 . Left ventricular ejection fraction, by estimation, is  35 to 40%. The left ventricle has moderately decreased function. The left  ventricle demonstrates regional  wall motion abnormalities (see scoring diagram/findings for description).  The left ventricular internal cavity size was moderately dilated. Left  ventricular diastolic parameters are consistent with Grade II diastolic  dysfunction (pseudonormalization).  Elevated left ventricular end-diastolic pressure.   2. Right ventricular systolic function is normal. The right ventricular  size is normal.   3. Left atrial size was mildly dilated.   4. The mitral valve is abnormal. Mild mitral valve regurgitation. No  evidence of mitral stenosis.   5. The aortic valve is tricuspid. There is moderate calcification of the  aortic valve. Aortic valve regurgitation is not visualized. Mild to  moderate aortic valve sclerosis/calcification is present, without any  evidence of aortic stenosis.   6. The inferior vena cava is normal in size with greater than 50%  respiratory variability, suggesting right atrial  pressure of 3 mmHg.    Patient Profile     70 y.o. female with a history of diffuse aorta and coronary calcifications noted on chest CT in [id number], chronic diastolic CHF, SVT, prior PE on lifelong anticoagulation,  asthma, tracheobronchomalacia on CPAP, bronchiectasis, vocal cord dysfunction, seronegative rheumatoid arthritis, and CKD stage III. Patient was admitted for septic right elbow olecranon bursitis and cellulitis of right hand. S/p I&D on 09/22/2020. Cardiology consulted for further evaluation of recent outpatient Echo which showed newly reduced EF of 35-40% with new wall motion abnormalities.  Assessment & Plan    Chronic HF with new CM --EF now 35-40% down from >75% in 2021 (ordered by PCP for ongoing dyspnea) moderately dilated and +regional wall motion abnormalities. G2DD. --was on lasix but held due to rise in Cr now on po lasix 80 daily  - now neg 371 ml since admit and neg 1860 yesterday.  -entresto increased and bisoprolol added -+fungal infection with [place] Glade so stopped.  -will need cardiac cath once infection has improved  HTN -. Controlled 123456 systolic to 123456  - on bisoprolol and entresto   HLD  --goal LDL <70 and lipitor increased to 80 daily.  Repeat lipids and hepatic in 6-8 weeks  Prior PE/DVT on liflong anticoagualtion on Xarelto   CKD 3 no BMP today ?  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  I have reviewed the chart, notes and new data.  I agree with PA/NP's note.  Key new complaints: continues to have dyspnea Key examination changes: no overt fluid overload (no S3, JVD, rales or edema) Key new  findings / data: significant bump in creatinine again, to 1.8  PLAN: Current low-normal BP unlikely to allow additional titration of Entresto and beta blocker. Hold diuretic again. Need weight measurement. Plan cardiac cath in September, after completion of antibiotic course.  Sanda Klein, MD, [place] [phone] 09/29/2020, 1:54 PM

## 2020-09-29 NOTE — TOC Progression Note (Signed)
Transition of Care [facility]) - Progression Note    Patient Details  Name: Ebony Scott MRN: [id number] Date of Birth: 1951-02-14  Transition of Care [facility]) CM/SW Contact  Kanae Ignatowski, Marjie Skiff, RN Phone Number: 09/29/2020, 2:43 PM  Clinical Narrative:    Per MD pt has now agreed to SNF. FL2 faxed out to area facilities. TOC will follow for SNF bed offers.   Readmission Risk Interventions Readmission Risk Prevention Plan 09/26/2020  Transportation Screening Complete  Medication Review [employer]) Complete  PCP or Specialist appointment within 3-5 days of discharge Complete  HRI or [place] Complete  SW Recovery Care/Counseling Consult Complete  [place] Not Applicable  Some recent data might be hidden

## 2020-09-29 NOTE — Progress Notes (Signed)
[facility] for Infectious Disease  Date of Admission:  09/22/2020           Reason for visit: Follow up on septic olecranon bursitis, abscess of right hand   Current antibiotics: Fluconazole 8/5-present   Previous antibiotics: Daptomycin 8/5-8/8    ASSESSMENT:    Septic olecranon bursitis: Cultures from admission with Candida albicans.  Susceptibilities sent to [facility] for further testing.  She previously had completed a 3-week course of fluconazole in June 2022 after aspiration cultures grew the same organism.  This admission she underwent excisional debridement and bursectomy with I&D on 09/22/2020. Rheumatoid nodule/abscess of the right hand: Cultures also with Candida albicans.  Status post excisional debridement of the dorsal rheumatoid nodule with I&D of the third MCP on 09/22/2020. Right foot pain: She was diagnosed with second digit osteomyelitis and underwent second digit excision of the phalanx/internal amputation with podiatry on 07/22/2020.  X-ray showed chronic fracture of the 4th and 5th metatarsals.  Pain stable today.  Inflammatory markers elevated but difficult to interpret given her recent surgery Elevated LFTs: Normalized Diabetes: A1c 8.4. Rheumatoid arthritis Chronic kidney disease: Creatinine increased today to 1.8 Chronic CHF with newly reduced EF: Being followed by cardiology.  PLAN:    Continue fluconazole '200mg'$  daily dosed per pharmacy Tentatively plan for 4 weeks antifungal from date of surgery (end date 10/20/20).  This is more prolonged than normal for septic bursitis however opting for prolonged course given her hx of recurrent infection and immunosuppression Continue wound care, glycemic control, routine lab monitoring. Agree with having her discharge to skilled rehab as there are concerns regarding her ability to care for herself Foot pain appears stable today and x-ray showed chronic fractures which helps explain her pain.  Will follow as  outpatient.  If worsens at follow up will see about MRI outpatient Follow up with me scheduled for 10/18/20   Principal Problem:   Septic olecranon bursitis of right elbow Active Problems:   Hyperlipidemia   GERD   Chronic combined systolic and diastolic CHF (congestive heart failure) (HCC)   Chronic kidney disease, stage 3b (HCC)   Pulmonary nodules   Hypothyroidism   Rheumatoid arthritis ([place])   Tracheobronchomalacia   Cellulitis of right hand   Abscess of dorsum of right hand   Osteomyelitis of second toe of right foot (HCC)   DMII (diabetes mellitus, type 2) (HCC)   History of DVT (deep vein thrombosis)   Physical deconditioning    MEDICATIONS:    Scheduled Meds:  allopurinol  100 mg Oral Daily   arformoterol  15 mcg Nebulization BID   And   umeclidinium bromide  1 puff Inhalation Daily   bisoprolol  2.5 mg Oral Daily   cyproheptadine  8 mg Oral QPM   docusate sodium  100 mg Oral BID   famotidine  20 mg Oral Daily   fluconazole  200 mg Oral Daily   fluticasone  2 spray Each Nare Daily   folic acid  1 mg Oral Daily   furosemide  80 mg Oral Daily   gabapentin  600 mg Oral QHS   guaiFENesin  600 mg Oral BID   insulin aspart  0-15 Units Subcutaneous TID WC   insulin aspart  0-5 Units Subcutaneous QHS   insulin aspart  3 Units Subcutaneous TID WC   insulin detemir  5 Units Subcutaneous QHS   loratadine  10 mg Oral Daily   magnesium oxide  400 mg Oral Daily  mirtazapine  30 mg Oral QHS   montelukast  10 mg Oral Daily   multivitamin with minerals  1 tablet Oral Daily   pantoprazole  40 mg Oral QPM   predniSONE  12 mg Oral Q breakfast   rivaroxaban  20 mg Oral Q supper   sacubitril-valsartan  1 tablet Oral BID   tetrahydrozoline  1 drop Both Eyes BID   Continuous Infusions: PRN Meds:.acetaminophen, albuterol, azelastine, bisacodyl, dextromethorphan, diclofenac Sodium, ondansetron **OR** ondansetron (ZOFRAN) IV, polyethylene glycol, senna, traMADol  SUBJECTIVE:    24 hour events:  No acute events noted Foot x-ray showed chronic fractures of 4th and 5th mets Creatinine 1.8 today WBC stable  She is working with therapy.  Feeling poor.  No improvement overall.  Everyone worried about her going home to care for herself.  Review of Systems  All other systems reviewed and are negative.    OBJECTIVE:   Blood pressure (!) 105/52, pulse 75, temperature (!) 97.5 F (36.4 C), temperature source Oral, resp. rate 19, height '5\' 1"'$  (1.549 m), weight 63.5 kg, SpO2 94 %. Body mass index is 26.45 kg/m.  Physical Exam Constitutional:      Comments: Fatigued appearing, NAD.  Sitting in recliner.  Just worked with therapy.  HENT:     Head: Normocephalic and atraumatic.  Eyes:     Extraocular Movements: Extraocular movements intact.     Conjunctiva/sclera: Conjunctivae normal.  Pulmonary:     Effort: Pulmonary effort is normal. No respiratory distress.  Musculoskeletal:     Comments: RUE wrapped Right foot in Dar[place] boot  Skin:    General: Skin is warm and dry.  Neurological:     General: No focal deficit present.     Mental Status: She is oriented to person, place, and time.  Psychiatric:        Mood and Affect: Mood normal.        Behavior: Behavior normal.     Lab Results: Lab Results  Component Value Date   WBC 17.6 (H) 09/28/2020   HGB 10.2 (L) 09/28/2020   HCT 33.7 (L) 09/28/2020   MCV 108.4 (H) 09/28/2020   PLT 252 09/28/2020    Lab Results  Component Value Date   NA 137 09/29/2020   K 4.4 09/29/2020   CO2 25 09/29/2020   GLUCOSE 214 (H) 09/29/2020   BUN 45 (H) 09/29/2020   CREATININE 1.83 (H) 09/29/2020   CALCIUM 9.2 09/29/2020   GFRNONAA 29 (L) 09/29/2020   GFRAA 40 (L) 08/25/2020    Lab Results  Component Value Date   ALT 34 09/28/2020   AST 32 09/28/2020   ALKPHOS 71 09/28/2020   BILITOT 0.5 09/28/2020       Component Value Date/Time   CRP 24.3 (H) 09/29/2020 0515       Component Value Date/Time    ESRSEDRATE 122 (H) 09/29/2020 [id number]     I have reviewed the micro and lab results in Epic.  Imaging: DG Foot Complete Right  Result Date: 09/28/2020 CLINICAL DATA:  RIGHT foot pain. EXAM: RIGHT FOOT COMPLETE - 3+ VIEW COMPARISON:  Foot radiograph 07/20/2020 FINDINGS: Chronic fractures at the base of the fourth and fifth metatarsal. No acute fracture dislocation. No soft tissue abnormality. IMPRESSION: 1. No acute findings of the RIGHT foot. 2. Chronic fractures of the base of the fourth and fifth metatarsal. Electronically Signed   By: Suzy Bouchard M.D.   On: 09/28/2020 16:05     Imaging independently reviewed in Epic.  [facility] [phone] pager 09/29/2020, 12:01 PM

## 2020-09-29 NOTE — Progress Notes (Signed)
Occupational Therapy Treatment Patient Details Name: Ebony Scott MRN: [id number] DOB: 1950/09/10 Today's Date: 09/29/2020    History of present illness Ebony Scott is a 70 y.o. female with medical history significant of ashtma, combined systolic/diastolic HF, tracheobronchomalacia,  Right second digit internal amputation 6/. 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 Patient s/p Excisional debridement, right olecranon bursa with olecranon bursectomy and irrigation and debridement and excisional debridement, right dorsal rheumatoid nodule, overlying the long metacarpal phalangeal joint.   OT comments  Patient was noted to be more lethargic today with increased time to process cues and multiple attempts to complete tasks which is a change since presentation on last OT session. Patient was noted to self limti WB on LLE with increased propulsion into recliner with TD to sit properly into recliner. Patietn was continuously educated on taking transfer slow and proper sequencing of task. Patient required increased cues to sequence sit to stands on this date with 2 attempts amde from recliner with patient unable to stand for longer than 20 seconds 1st attempt and 10 seconds 2nd attempt. Nurse consulted over concerns over patient plans to d/c home. Patient reported going home with daughter who works at night for support. Patient would need 24/7 care to be able to safely d/c home today. Patient would continue to benefit from skilled OT services at this time while admitted and after d/c in order to improve overall safety and independence in ADLs.    Follow Up Recommendations  SNF    Equipment Recommendations  None recommended by OT    Recommendations for Other Services      Precautions / Restrictions Precautions Precautions: Fall Splint/Cast: long posterior splint RUE, surgical shoe on Right, has misplaced left house shoe since admission  to  balance out( RN provided sneaker for L foot) Restrictions Weight Bearing Restrictions: Yes RUE Weight Bearing: Weight bearing as tolerated       Mobility Bed Mobility Overal bed mobility: Needs Assistance Bed Mobility: Supine to Sit     Supine to sit: Min assist;HOB elevated          Transfers Overall transfer level: Needs assistance Equipment used: Rolling walker (2 wheeled) Transfers: Sit to/from [facility] Sit to Stand: Min assist (with two attempts made with patient needing increased time to follow cues.) Stand pivot transfers: Mod assist            Balance Overall balance assessment: Needs assistance Sitting-balance support: No upper extremity supported Sitting balance-Leahy Scale: Fair     Standing balance support: During functional activity;Single extremity supported Standing balance-Leahy Scale: Poor Standing balance comment: heavy reliance on external support                           ADL either performed or assessed with clinical judgement   ADL     Eating/Feeding Details (indicate cue type and reason): patietn completed washing face  sitting on edge of bed. patient decilned to participate further in washing up or oral hygiene at this time.                                 Functional mobility during ADLs: Moderate assistance;Rolling walker General ADL Comments: patient required min A for supine to sit on edge of bed with multiple attempts made. patient's O2 saturation maintained above 90s with activity but  increased shortness of breath was noted. patient was noted to be less engaged in conversation today with patient unable to pinpoint what had changed since last visit. patient required mod A to transfer into recliner from bed with rolling walker with TD to sit down in chair with patient attempting to sit down earlier than transfer allowed to make it into chair. patient was educated on importance of completing turn  prior to attempting to sit down. patient verbalized understanding. patient attempted sit to stand for proper positioning in chair with inability to stand for longer than 30 seconds. patient with increased rest time trialed sit to stand again with 5 seconds of standing.     Vision       Perception     Praxis      Cognition Arousal/Alertness: Lethargic Behavior During Therapy: Flat affect Overall Cognitive Status: Within Functional Limits for tasks assessed                                 General Comments: patient required increased cues to attend to task with increased lethargic behavior during session. patient was noted to not be as conversational as previous session this week.        Exercises     Shoulder Instructions       General Comments      Pertinent Vitals/ Pain       Pain Assessment: 0-10 Pain Score: 6  Pain Location: R elbow Pain Descriptors / Indicators: Discomfort Pain Intervention(s): Limited activity within patient's tolerance;Patient requesting pain meds-RN notified;RN gave pain meds during session  Home Living                                          Prior Functioning/Environment              Frequency  Min 2X/week        Progress Toward Goals  OT Goals(current goals can now be found in the care plan section)     Acute Rehab OT Goals Patient Stated Goal: go home with some help OT Goal Formulation: With patient Time For Goal Achievement: 10/07/20 Potential to Achieve Goals: Good  Plan Discharge plan remains appropriate    Co-evaluation                 AM-PAC OT "6 Clicks" Daily Activity     Outcome Measure   Help from another person eating meals?: A Little Help from another person taking care of personal grooming?: A Little Help from another person toileting, which includes using toliet, bedpan, or urinal?: A Lot Help from another person bathing (including washing, rinsing, drying)?: A  Lot Help from another person to put on and taking off regular upper body clothing?: A Lot Help from another person to put on and taking off regular lower body clothing?: A Lot 6 Click Score: 14    End of Session Equipment Utilized During Treatment: Rolling walker;Gait belt  OT Visit Diagnosis: Unsteadiness on feet (R26.81);Pain Pain - Right/Left: Right Pain - part of body: Arm   Activity Tolerance Patient tolerated treatment well   Patient Left in chair;with call bell/phone within reach   Nurse Communication Other (comment) (patients change in presentation, pain in elbow and SNF recommendation)        Time: [id number] OT Time Calculation (min): 45 min  Charges: OT General Charges $OT Visit: 1 Visit OT Treatments $Self Care/Home Management : 38-52 mins (infectious disease MD was noted to come in to speak with patient during session. MD educated on patients presentation on this date.)  Jackelyn Poling OTR/L, [place] [facility] Office# [phone] Pager# [phone]    [place] 09/29/2020, 10:32 AM

## 2020-09-29 NOTE — Progress Notes (Signed)
Subjective: 7 Days Post-Op s/p Procedure(s): IRRIGATION AND DEBRIDEMENT RIGHT HAND AND ELBOW   Patient is oriented, somnolent. Moaning with movements. Moderate-severe pain at elbow. States her right 5th fingers hurts. Denies chest pain, Calf pain. No nausea/vomiting. "I don't want to be discharged tomorrow"  Objective:  PE: VITALS:   Vitals:   09/28/20 0922 09/28/20 1658 09/28/20 2057 09/29/20 0451  BP:  (!) 163/82 (!) 118/53 (!) 105/52  Pulse:  93 83 75  Resp:  '18 16 19  '$ Temp:  98.5 F (36.9 C) 98.8 F (37.1 C) (!) 97.5 F (36.4 C)  TempSrc:  Oral Oral Oral  SpO2: 95% 99% 100% 100%  Weight:      Height:       General: sitting up in bed, in no acute distres MSK: Incisions continue to be well opposed with full decompression of right olecranon bursa and MCP nodule. No drainage. Full ROM at right elbow but pain with flexion beyond 90 degrees. Able to flex, extend, and abduct all fingers of right hand, though motion limited due to RA. No TTP to any aspect of right 5th finger. Distal sensation intact. Capillary refill intact.   LABS  Results for orders placed or performed during the hospital encounter of 09/22/20 (from the past 24 hour(s))  Glucose, capillary     Status: Abnormal   Collection Time: 09/28/20  7:37 AM  Result Value Ref Range   Glucose-Capillary 105 (H) 70 - 99 mg/dL   Comment 1 Notify RN    Comment 2 Document in Chart   Glucose, capillary     Status: Abnormal   Collection Time: 09/28/20 12:26 PM  Result Value Ref Range   Glucose-Capillary 213 (H) 70 - 99 mg/dL   Comment 1 Notify RN    Comment 2 Document in Chart   Glucose, capillary     Status: Abnormal   Collection Time: 09/28/20  4:55 PM  Result Value Ref Range   Glucose-Capillary 189 (H) 70 - 99 mg/dL  Glucose, capillary     Status: Abnormal   Collection Time: 09/28/20  8:59 PM  Result Value Ref Range   Glucose-Capillary 153 (H) 70 - 99 mg/dL  C-reactive protein     Status: Abnormal   Collection  Time: 09/29/20  5:15 AM  Result Value Ref Range   CRP 24.3 (H) <1.0 mg/dL   *Note: Due to a large number of results and/or encounters for the requested time period, some results have not been displayed. A complete set of results can be found in Results Review.    DG Foot Complete Right  Result Date: 09/28/2020 CLINICAL DATA:  RIGHT foot pain. EXAM: RIGHT FOOT COMPLETE - 3+ VIEW COMPARISON:  Foot radiograph 07/20/2020 FINDINGS: Chronic fractures at the base of the fourth and fifth metatarsal. No acute fracture dislocation. No soft tissue abnormality. IMPRESSION: 1. No acute findings of the RIGHT foot. 2. Chronic fractures of the base of the fourth and fifth metatarsal. Electronically Signed   By: Suzy Bouchard M.D.   On: 09/28/2020 16:05    Assessment/Plan: Septic right elbow olecranon bursitis Right dorsal infected rheumatoid nodule, hand, overlying the third metacarpal phalangeal joint. 7 Days Post-Op s/p Procedure(s): IRRIGATION AND DEBRIDEMENT RIGHT HAND AND ELBOW - cultures growing candida albicans, ID following, sent off for sensitivities Weightbearing: WBAT RUE Insicional and dressing care: dressings changed this morning Ok to change every 24-48 hours at home with new xeroform, 4x4 gauze, abd's, and gauze wrap with posterior splint over  it. I think right 5th finger pain is coming from the posterior splint, I have added significantly more padding to splint which should help. VTE prophylaxis: Home xarelto Pain control: continue current regimen Follow - up plan: 1 week after discharge with Dr. Mardelle Matte Dispo: patient agreeable to SNF per last PT note, ok to discharge to SNF when medically optimized from ortho standpoint. Discharge instructions updated with dressing recommendations.    Ventura Bruns 09/29/2020, 6:59 AM

## 2020-09-29 NOTE — Progress Notes (Signed)
[facility] PROGRESS NOTE    Progress Note  Ebony Scott  VOH:[id number] DOB: [birth year] DOA: 09/22/2020 PCP: [name], MD     Brief Narrative:   Ebony Scott is an 70 y.o. female past medical history of DVT anxiety, rheumatoid arthritis breast cancer C. difficile referral vascular disease tracheal bronchomalacia who presents with right hand pain and swelling, seen by Ortho and had multiple I&D 1 being 3 weeks ago since then she is noticing increased swelling and erythema of the arm, Ortho admitted her for debridement of her right hand and elbow perform I&D on 09/22/2020 due to septic olecranon bursitis with cultures growing Candida albicans susceptible to fluconazole ID was consulted for inpatient assistance  Assessment/Plan:   Septic olecranon bursitis of right elbow: ID on board cultures grew Candida Albicans (in setting of steroids) on fluconazole  Underwent I&D on 09/22/2021. Orthopedic surgery and ID recommended prolonged antifungal course. CRP continues to be elevated. Candida susceptibility sent to [facility].  LFTs are improved.  Abscess of the dorsum of the right hand: Status post excisional debridement and I&D of the third MCP. ID on board they recommended to discontinue daptomycin continue fluconazole dressing changes per Ortho. Will need follow dressing changes by Ortho prior to DC.  Chronic combined systolic and diastolic heart failure: With a recent 2D echo on 09/16/2020 that showed a reduced EF of 35%. Currently on Entresto, was being diuresed but her creatinine bumped and Lasix was held.   Cardiology on board recommended to continue beta-blockers and loop diuretic therapy. She is negative about 300 cc.  Basic metabolic panels pending. Will need ischemic evaluation as an outpatient once infection is controlled.  Physical deconditioning: Her goal is to go home with home health, but physical therapy and Occupational Therapy have evaluated the patient they  recommended home health PT.  History of DVT: Continue lifelong Xarelto.  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 Follow-up with podiatrist as an outpatient. Continue Betadine wet-to-dry dressing changes.  Rheumatoid arthritis: Treated with Rituxan and prednisone daily.  Hyperlipidemia: Continue Lipitor.  Diabetes mellitus type 2: With a last A1c of 8.4, Farxiga was held in the setting of yeast infection. She is currently on long-acting insulin plus sliding scale blood glucose continue to be erratic in the setting of steroids.  History of tracheomalacia: Continue Stiltol.  Pulmonary nodule: Follow-up with pulmonary as an outpatient status post BAL on 04/05/2020 that grew Candida albicans. Repeated CT on 05/25/2020 showed improvement in decreased size of nodule.  Chronic kidney disease stage IIIb: With a baseline creatinine around 1.2 on admission 1.6. Now returning to baseline.   DVT prophylaxis: Xarelto Family Communication:Daughter Status is: Inpatient  Remains inpatient appropriate because:Hemodynamically unstable  Dispo: The patient is from: Home              Anticipated d/c is to: Home              Patient currently is not medically stable to d/c.   Difficult to place patient No   Code Status:     Code Status Orders  (From admission, onward)           Start     Ordered   09/22/20 1647  Do not attempt resuscitation (DNR)  Continuous       Question Answer Comment  In the event of cardiac or respiratory ARREST Do not call a "code blue"   In the event of cardiac or respiratory ARREST Do  not perform Intubation, CPR, defibrillation or ACLS   In the event of cardiac or respiratory ARREST Use medication by any route, position, wound care, and other measures to relive pain and suffering. May use oxygen, suction and manual treatment of airway obstruction as needed for comfort.      09/22/20 1646           Code  Status History     Date Active Date Inactive Code Status Order ID Comments User Context   02/23/2020 1504 02/29/2020 2009 Full Code [id number]  Lequita Halt, MD ED   02/16/2020 1420 02/19/2020 1914 Full Code [id number]  Ventura Bruns, PA-C Inpatient   03/06/2018 1827 03/09/2018 1837 Full Code [id number]  Nita Sells, MD Inpatient   02/10/2018 1659 02/12/2018 1941 Full Code [id number]  Kerney Elbe, DO ED   01/15/2018 0828 01/23/2018 2110 Full Code [id number]  Cathlyn Parsons, PA-C Inpatient   01/15/2018 0828 01/15/2018 0828 Full Code [id number]  Cathlyn Parsons, PA-C Inpatient   01/14/2018 2027 01/15/2018 0827 Full Code [id number]  Reubin Milan, MD ED   12/13/2017 1706 12/25/2017 0103 Full Code [id number]  Reubin Milan, MD ED   04/15/2016 2127 04/17/2016 1943 Full Code [id number]  Vianne Bulls, MD ED   02/20/2012 1729 02/26/2012 1540 Full Code [id number]  Payton Emerald, RN Inpatient      Advance Directive Documentation    Flowsheet Row Most Recent Value  Type of Advance Directive Healthcare Power of Attorney, Living will  Pre-existing out of facility DNR order (yellow form or pink MOST form) --  "MOST" Form in Place? --         IV Access:   Peripheral IV   Procedures and diagnostic studies:   DG Foot Complete Right  Result Date: 09/28/2020 CLINICAL DATA:  RIGHT foot pain. EXAM: RIGHT FOOT COMPLETE - 3+ VIEW COMPARISON:  Foot radiograph 07/20/2020 FINDINGS: Chronic fractures at the base of the fourth and fifth metatarsal. No acute fracture dislocation. No soft tissue abnormality. IMPRESSION: 1. No acute findings of the RIGHT foot. 2. Chronic fractures of the base of the fourth and fifth metatarsal. Electronically Signed   By: Suzy Bouchard M.D.   On: 09/28/2020 16:05     Medical Consultants:   None.   Subjective:    Ebony Scott pain is controlled continues to work with physical therapy.  Objective:    Vitals:   09/28/20 2057  09/29/20 0451 09/29/20 0731 09/29/20 0734  BP: (!) 118/53 (!) 105/52    Pulse: 83 75    Resp: 16 19    Temp: 98.8 F (37.1 C) (!) 97.5 F (36.4 C)    TempSrc: Oral Oral    SpO2: 100% 100% 94% 94%  Weight:      Height:       SpO2: 94 % O2 Flow Rate (L/min): 2 L/min   Intake/Output Summary (Last 24 hours) at 09/29/2020 0258 Last data filed at 09/28/2020 2314 Gross per 24 hour  Intake 240 ml  Output 2100 ml  Net -1860 ml    Filed Weights   09/22/20 1211  Weight: 63.5 kg    Exam: General exam: In no acute distress. Respiratory system: Good air movement and clear to auscultation. Cardiovascular system: S1 & S2 heard, RRR. No JVD. Gastrointestinal system: Abdomen is nondistended, soft and nontender.  Extremities: No pedal edema. Skin: No rashes, lesions or ulcers Psychiatry: Judgement and insight appear normal. Mood & affect appropriate.   Data Reviewed:  Labs: Basic Metabolic Panel: Recent Labs  Lab 09/24/20 0618 09/25/20 0545 09/26/20 0457 09/27/20 0503 09/28/20 0528  NA 137 139 138 139 140  K 4.2 4.0 3.5 4.3 4.5  CL 102 105 102 105 109  CO2 _0 GLUCOSE 223* 170* 156* 108* 136*  BUN 41* 42* 53* 48* 44*  CREATININE 1.47* 1.31* 1.47* 1.35* 1.22*  CALCIUM 8.4* 8.5* 8.5* 8.7* 8.9  MG 2.2 2.2 2.2 2.4 2.4    GFR Estimated Creatinine Clearance: 35.6 mL/min (A) (by C-G formula based on SCr of 1.22 mg/dL (H)). Liver Function Tests: Recent Labs  Lab 09/22/20 1302 09/23/20 0010 09/28/20 0528  AST 53* 62* 32  ALT 72* 60* 34  ALKPHOS 82 75 71  BILITOT 1.1 1.9* 0.5  PROT 6.5 6.2* 5.6*  ALBUMIN 3.3* 3.0* 2.4*    No results for input(s): LIPASE, AMYLASE in the last 168 hours. No results for input(s): AMMONIA in the last 168 hours. Coagulation profile Recent Labs  Lab 09/22/20 1302  INR 1.0    COVID-19 Labs  Recent Labs    09/26/20 0848 09/29/20 0515  FERRITIN 102  --   CRP  --  24.3*     Lab Results  Component Value Date    SARSCOV2NAA NEGATIVE 09/22/2020   [place] NEGATIVE 04/02/2020   SARSCOV2NAA NEGATIVE 02/23/2020   [place] NEGATIVE 02/15/2020    CBC: Recent Labs  Lab 09/24/20 0618 09/25/20 0545 09/26/20 0457 09/27/20 0503 09/28/20 0528  WBC 17.2* 18.6* 15.8* 16.7* 17.6*  NEUTROABS 14.7* 15.0* 11.9* 12.3* 13.2*  HGB 10.7* 11.3* 11.0* 11.3* 10.2*  HCT 34.5* 36.1 35.8* 36.6 33.7*  MCV 106.5* 105.9* 106.9* 108.0* 108.4*  PLT 224 246 238 266 252    Cardiac Enzymes: Recent Labs  Lab 09/24/20 0618 09/26/20 0457  CKTOTAL 649* 232    BNP (last 3 results) No results for input(s): PROBNP in the last 8760 hours. CBG: Recent Labs  Lab 09/28/20 0737 09/28/20 1226 09/28/20 1655 09/28/20 2059 09/29/20 0801  GLUCAP 105* 213* 189* 153* 109*    D-Dimer: No results for input(s): DDIMER in the last 72 hours. Hgb A1c: No results for input(s): HGBA1C in the last 72 hours. Lipid Profile: No results for input(s): CHOL, HDL, LDLCALC, TRIG, CHOLHDL, LDLDIRECT in the last 72 hours. Thyroid function studies: No results for input(s): TSH, T4TOTAL, T3FREE, THYROIDAB in the last 72 hours.  Invalid input(s): FREET3 Anemia work up: Recent Labs    09/26/20 0848  VITAMINB12 1,486*  FOLATE 37.6  FERRITIN 102  TIBC 367  IRON 39    Sepsis Labs: Recent Labs  Lab 09/22/20 1302 09/22/20 2352 09/23/20 0010 09/25/20 0545 09/26/20 0457 09/27/20 0503 09/28/20 0528  WBC 14.5* 16.7*   < > 18.6* 15.8* 16.7* 17.6*  LATICACIDVEN 1.6 1.3  --   --   --   --   --    < > = values in this interval not displayed.    Microbiology Recent Results (from the past 240 hour(s))  Blood Culture (routine x 2)     Status: None   Collection Time: 09/22/20  1:03 PM   Specimen: BLOOD  Result Value Ref Range Status   Specimen Description   Final    BLOOD RIGHT ANTECUBITAL Performed at [facility] [address], [place], [place] [address]    Special Requests   Final    BOTTLES DRAWN  AEROBIC AND ANAEROBIC Blood Culture adequate volume Performed at [facility],  [place] [address] [place], [place] [address]    Culture   Final    NO GROWTH 5 DAYS Performed at [facility], [place] [address], [place], [place] [address]    Report Status 09/27/2020 FINAL  Final  Resp Panel by RT-PCR (Flu A&B, Covid) Nasopharyngeal Swab     Status: None   Collection Time: 09/22/20  1:04 PM   Specimen: Nasopharyngeal Swab; Nasopharyngeal(NP) swabs in vial transport medium  Result Value Ref Range Status   SARS Coronavirus 2 by RT PCR NEGATIVE NEGATIVE Final    Comment: (NOTE) SARS-CoV-2 target nucleic acids are NOT DETECTED.  The SARS-CoV-2 RNA is generally detectable in upper respiratory specimens during the acute phase of infection. The lowest concentration of SARS-CoV-2 viral copies this assay can detect is 138 copies/mL. A negative result does not preclude SARS-Cov-2 infection and should not be used as the sole basis for treatment or other patient management decisions. A negative result may occur with  improper specimen collection/handling, submission of specimen other than nasopharyngeal swab, presence of viral mutation(s) within the areas targeted by this assay, and inadequate number of viral copies(<138 copies/mL). A negative result must be combined with clinical observations, patient history, and epidemiological information. The expected result is Negative.  Fact Sheet for Patients:  EntrepreneurPulse.com.au  Fact Sheet for Healthcare Providers:  IncredibleEmployment.be  This test is no t yet approved or cleared by the [place] FDA and  has been authorized for detection and/or diagnosis of SARS-CoV-2 by FDA under an Emergency Use Authorization (EUA). This EUA will remain  in effect (meaning this test can be used) for the duration of the COVID-19 declaration under Section 564(b)(1) of the Act, 21 U.S.C.section  360bbb-3(b)(1), unless the authorization is terminated  or revoked sooner.       Influenza A by PCR NEGATIVE NEGATIVE Final   Influenza B by PCR NEGATIVE NEGATIVE Final    Comment: (NOTE) The Xpert Xpress SARS-CoV-2/FLU/RSV plus assay is intended as an aid in the diagnosis of influenza from Nasopharyngeal swab specimens and should not be used as a sole basis for treatment. Nasal washings and aspirates are unacceptable for Xpert Xpress SARS-CoV-2/FLU/RSV testing.  Fact Sheet for Patients: EntrepreneurPulse.com.au  Fact Sheet for Healthcare Providers: IncredibleEmployment.be  This test is not yet approved or cleared by the [place] FDA and has been authorized for detection and/or diagnosis of SARS-CoV-2 by FDA under an Emergency Use Authorization (EUA). This EUA will remain in effect (meaning this test can be used) for the duration of the COVID-19 declaration under Section 564(b)(1) of the Act, 21 U.S.C. section 360bbb-3(b)(1), unless the authorization is terminated or revoked.  Performed at [facility], [place] [address], [place], [place] [address]   Blood Culture (routine x 2)     Status: None   Collection Time: 09/22/20  1:08 PM   Specimen: BLOOD  Result Value Ref Range Status   Specimen Description   Final    BLOOD LEFT ANTECUBITAL Performed at [facility] [address] [place], [place] [address]    Special Requests   Final    BOTTLES DRAWN AEROBIC AND ANAEROBIC Blood Culture adequate volume Performed at [facility] [address], [place], [place] [address]    Culture   Final    NO GROWTH 5 DAYS Performed at [facility], [place] [address], [place], [place] [address]    Report Status 09/27/2020 FINAL  Final  Fungus Culture With Stain  Status: None (Preliminary result)   Collection Time: 09/22/20 10:05 PM   Specimen: Synovial, Right Elbow; Tissue  Result  Value Ref Range Status   Fungus Stain Final report  Final    Comment: (NOTE) Performed At: [facility] [place], [place] [id number] Rush Farmer MD [id number]    Fungus (Mycology) Culture PENDING  Incomplete   Fungal Source HAND  Final    Comment: Performed at [facility], [place] [address], [place], [place] [address]  Aerobic/Anaerobic Culture w Gram Stain (surgical/deep wound)     Status: None (Preliminary result)   Collection Time: 09/22/20 10:05 PM   Specimen: Synovial, Right Elbow; Tissue  Result Value Ref Range Status   Specimen Description   Final    SYNOVIAL Performed at [facility] [address] [place], [place] [address]    Special Requests   Final    NONE Performed at [facility], [place] [address] [place], [place] [address]    Gram Stain   Final    RARE WBC PRESENT,BOTH PMN AND MONONUCLEAR NO ORGANISMS SEEN    Culture   Final    RARE CANDIDA ALBICANS NO ANAEROBES ISOLATED CRITICAL RESULT CALLED TO, READ BACK BY AND VERIFIED WITH: DR. Mardelle Matte VIA EPIC [phone] FCP Sent to [place] for further susceptibility testing. Performed at [facility], [name] [address], [place], [place] [address]    Report Status PENDING  Incomplete  Fungus Stain     Status: None   Collection Time: 09/22/20 10:05 PM   Specimen: Synovial, Right Elbow; Tissue  Result Value Ref Range Status   FUNGUS STAIN DUPLICATE  Final    Comment: (NOTE) Duplicate procedure ordered.      Refer to [id number] for results. Performed At: [facility] [place], [place] [id number] Rush Farmer MD [id number]    Fungal Source SYNOVIAL  Final    Comment: Performed at [facility], [place] [address], [place], [place] [address]  Acid Fast Smear (AFB)     Status: None   Collection Time: 09/22/20 10:05 PM   Specimen: Synovial, Right Elbow; Tissue  Result Value Ref Range  Status   AFB Specimen Processing Comment  Final    Comment: Tissue Grinding and Digestion/Decontamination   Acid Fast Smear Negative  Final    Comment: (NOTE) Performed At: [facility] [address] [place], [place] [id number] Rush Farmer MD [id number]    Source (AFB) HAND  Final    Comment: Performed at [facility], [place] [address], [place], [place] [address]  Fungus Culture With Stain     Status: Abnormal (Preliminary result)   Collection Time: 09/22/20 10:05 PM   Specimen: Hand, Right; Body Fluid  Result Value Ref Range Status   Fungus Stain Final report (A)  Final    Comment: (NOTE) POSITIVE RESULT REPORTED TO CAMELIA M AT 01:43PM ON 09/27/20 BY SM. FAX #: [phone] Performed At: [facility] [address] [place], [place] [id number] Rush Farmer MD QP:[id number]    Fungus (Mycology) Culture PENDING  Incomplete   Fungal Source SYNOVIAL  Final    Comment: Performed at [facility], [place] [address] [place], [place] [address]  Aerobic/Anaerobic Culture w Gram Stain (surgical/deep wound)     Status: None (Preliminary result)   Collection Time: 09/22/20 10:05 PM   Specimen: Hand, Right; Body Fluid  Result Value Ref Range Status   Specimen Description   Final  HAND Performed at [facility], [place] [address] [place], [place] [address]    Special Requests   Final    NONE Performed at [facility], [place] [address] [place], [place] [address]    Gram Stain   Final    RARE WBC PRESENT, PREDOMINANTLY PMN NO ORGANISMS SEEN    Culture   Final    RARE CANDIDA ALBICANS NO ANAEROBES ISOLATED Performed at [facility], [place] [address], [place], [place] [address]    Report Status PENDING  Incomplete  Acid Fast Smear (AFB)     Status: None   Collection Time: 09/22/20 10:05 PM   Specimen: Hand, Right; Body Fluid  Result Value Ref Range Status   AFB Specimen Processing  Comment  Final    Comment: Tissue Grinding and Digestion/Decontamination   Acid Fast Smear Negative  Final    Comment: (NOTE) Performed At: [facility] [place], [place] [id number] Rush Farmer MD [id number]    Source (AFB) SYNOVIAL  Final    Comment: Performed at [facility], [place] [address], [place], [place] [address]  Fungus Culture Result     Status: None   Collection Time: 09/22/20 10:05 PM  Result Value Ref Range Status   Result 1 Comment  Final    Comment: (NOTE) KOH/Calcofluor preparation:  no fungus observed. Performed At: [facility] [place], [place] [id number] Rush Farmer MD [id number]   Fungus Culture Result     Status: Abnormal   Collection Time: 09/22/20 10:05 PM  Result Value Ref Range Status   Result 1 Comment (A)  Final    Comment: (NOTE) Pseudohyphae and yeasts observed Performed At: [facility], [place] [id number] Rush Farmer MD [id number]      Medications:    allopurinol  100 mg Oral Daily   arformoterol  15 mcg Nebulization BID   And   umeclidinium bromide  1 puff Inhalation Daily   bisoprolol  2.5 mg Oral Daily   cyproheptadine  8 mg Oral QPM   docusate sodium  100 mg Oral BID   famotidine  20 mg Oral Daily   fluconazole  200 mg Oral Daily   fluticasone  2 spray Each Nare Daily   folic acid  1 mg Oral Daily   furosemide  80 mg Oral Daily   gabapentin  600 mg Oral QHS   guaiFENesin  600 mg Oral BID   insulin aspart  0-15 Units Subcutaneous TID WC   insulin aspart  0-5 Units Subcutaneous QHS   insulin aspart  3 Units Subcutaneous TID WC   insulin detemir  5 Units Subcutaneous QHS   loratadine  10 mg Oral Daily   magnesium oxide  400 mg Oral Daily   mirtazapine  30 mg Oral QHS   montelukast  10 mg Oral Daily   multivitamin with minerals  1 tablet Oral Daily   pantoprazole  40 mg Oral QPM   polyethylene glycol  17 g Oral BID    predniSONE  12 mg Oral Q breakfast   rivaroxaban  20 mg Oral Q supper   sacubitril-valsartan  1 tablet Oral BID   tetrahydrozoline  1 drop Both Eyes BID   Continuous Infusions:    LOS: 7 days   Charlynne Cousins  [facility]  09/29/2020, 8:22 AM

## 2020-09-29 NOTE — Progress Notes (Signed)
PHYSICAL THERAPY  Pt OOB to recliner via Occupational Therapy this morning.  Too fatigue to tolerate Physical Therapy as well that morning.  Returned later to find pt back in bed and resting.  Will continue to follow.  Pt has been evaluated with rec for SNF.  Rica Koyanagi  PTA Acute  Rehabilitation Services Pager      [phone] Office      [phone]

## 2020-09-29 NOTE — NC FL2 (Signed)
[place] LEVEL OF CARE SCREENING TOOL     IDENTIFICATION  Patient Name: Ebony Scott Birthdate: Jan 29, 1951 Sex: female Admission Date (Current Location): 09/22/2020  [facility] and [place] Number:  Herbalist and Address:  [facility],  [place], [place]      Provider Number: [id number]  Attending Physician Name and Address:  Charlynne Cousins, MD  Relative Name and Phone Number:       Current Level of Care: Hospital Recommended Level of Care: [place] Prior Approval Number:    Date Approved/Denied:   PASRR Number: [id number]  Discharge Plan: SNF    Current Diagnoses: Patient Active Problem List   Diagnosis Date Noted   Physical deconditioning 09/25/2020   History of DVT (deep vein thrombosis) 09/24/2020   Osteomyelitis of second toe of right foot ([place]) 09/23/2020   DMII (diabetes mellitus, type 2) ([place]) 09/23/2020   Cellulitis of right hand 09/22/2020   Septic olecranon bursitis of right elbow 09/22/2020   Abscess of dorsum of right hand 0000000   Metabolic encephalopathy [id number]   Acute encephalopathy    AMS (altered mental status) 02/23/2020   S/P reverse total shoulder arthroplasty, right    Hypokalemia    DOE (dyspnea on exertion) 02/18/2020   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   S/P reverse total shoulder arthroplasty, left 02/16/2020   Preoperative clearance 01/26/2020   Closed displaced fracture of metatarsal bone of right foot 01/20/2019   History of pulmonary embolus (PE) 12/19/2018   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   Rheumatoid arthritis ([place])    Bilateral impacted cerumen 08/18/2018   Fungal otitis externa 08/18/2018   Asthmatic bronchitis with exacerbation 07/29/2018   Pulmonary embolism ([place]) 07/29/2018   Cellulitis of forearm    Fever of unknown origin (FUO) 02/10/2018   Abdominal pain 02/10/2018   Fracture of base of fifth metatarsal bone with routine  healing, left    Closed fracture of fifth metatarsal bone of right foot, initial encounter 01/15/2018   DVT (deep venous thrombosis) ([place]) 01/14/2018   Hypothyroidism 01/14/2018   Depression 01/14/2018   Debility    Malnutrition of moderate degree 12/17/2017   Knee pain    History of breast cancer    Chronic pain syndrome    Fibromyalgia    Graves disease    Sepsis due to undetermined organism ([place]) 12/13/2017   Rhabdomyolysis 12/13/2017   Dysphonia 08/10/2017   Pulmonary nodules    Chronic kidney disease, stage 3b ([place]) 04/15/2016   Hx of adenomatous colonic polyps 04/12/2016   Cough 02/09/2016   Opacity of lung on imaging study 11/03/2015   Severe persistent asthma 02/08/2015   Facial pain syndrome 02/08/2015   Insomnia 02/08/2015   Other allergic rhinitis 02/08/2015   LPRD (laryngopharyngeal reflux disease) 02/08/2015   Chronic combined systolic and diastolic CHF (congestive heart failure) ([place]) 02/08/2015   Benign paroxysmal positional vertigo 12/03/2012   Dizziness and giddiness 10/29/2012   Disequilibrium 10/29/2012   Pseudogout 02/24/2012   Pneumonia 05/31/2011   Irritable bowel syndrome 01/02/2010   Hyperlipidemia 12/23/2009   Hyperthyroidism 11/12/2006   Seasonal and perennial allergic rhinitis 11/12/2006   GERD 11/12/2006   NECK PAIN, CHRONIC 11/12/2006   OSTEOPOROSIS 11/12/2006   BREAST CANCER, HX OF 11/12/2006    Orientation RESPIRATION BLADDER Height & Weight     Self, Time, Situation, Place  O2 (2L) Continent Weight: 63.5 kg Height:  '5\' 1"'$  (154.9 cm)  BEHAVIORAL SYMPTOMS/MOOD NEUROLOGICAL BOWEL NUTRITION STATUS  Continent Diet (Regular)  AMBULATORY STATUS COMMUNICATION OF NEEDS Skin   Extensive Assist Verbally Surgical wounds, Bruising                       Personal Care Assistance Level of Assistance  Bathing, Feeding, Dressing Bathing Assistance: Limited assistance Feeding assistance: Independent Dressing Assistance: Limited  assistance     Functional Limitations Info  Sight, Hearing, Speech Sight Info: Impaired Hearing Info: Adequate Speech Info: Adequate    SPECIAL CARE FACTORS FREQUENCY  PT (By licensed PT), OT (By licensed OT)     PT Frequency: 5 x weekly OT Frequency: 5 x weekly            Contractures Contractures Info: Not present    Additional Factors Info  Code Status, Allergies Code Status Info: DNR Allergies Info: Baclofen, Dust Mite Extract, Molds & Smuts, Morphine And Related, Other, Penicillins, Rofecoxib, Shellfish Allergy, Shrimp Flavor, Tetracycline Hcl, Xolair (Omalizumab), Zoledronic Acid, Dilaudid (Hydromorphone Hcl), Hydrocodone-acetaminophen, Levofloxacin, Oxycodone Hcl, Paroxetine, Celecoxib, Diltiazem, Diltiazem Hcl, Hydrocodone-acetaminophen, Lactose Intolerance (Gi), Morphine Sulfate, Oxycodone-acetaminophen, Penicillin G Procaine, Strawberry Flavor, Tree Extract           Current Medications (09/29/2020):  This is the current hospital active medication list Current Facility-Administered Medications  Medication Dose Route Frequency Provider Last Rate Last Admin   acetaminophen (TYLENOL) tablet 650 mg  650 mg Oral Q4H PRN Dwyane Dee, MD   650 mg at 09/29/20 1011   albuterol (PROVENTIL) (2.5 MG/3ML) 0.083% nebulizer solution 2.5 mg  2.5 mg Nebulization Q2H PRN Marchia Bond, MD   2.5 mg at 09/23/20 1406   allopurinol (ZYLOPRIM) tablet 100 mg  100 mg Oral Daily Dwyane Dee, MD   100 mg at 09/29/20 0947   arformoterol (BROVANA) nebulizer solution 15 mcg  15 mcg Nebulization BID Marylyn Ishihara, Tyrone A, DO   15 mcg at 09/29/20 0731   And   umeclidinium bromide (INCRUSE ELLIPTA) 62.5 MCG/INH 1 puff  1 puff Inhalation Daily Kyle, Tyrone A, DO   1 puff at 09/29/20 0731   azelastine (ASTELIN) 0.1 % nasal spray 2 spray  2 spray Each Nare QHS PRN Dwyane Dee, MD   2 spray at 09/24/20 2135   bisacodyl (DULCOLAX) suppository 10 mg  10 mg Rectal Daily PRN Marchia Bond, MD        bisoprolol (ZEBETA) tablet 2.5 mg  2.5 mg Oral Daily Sande Rives E, PA-C   2.5 mg at 09/29/20 0946   cyproheptadine (PERIACTIN) 4 MG tablet 8 mg  8 mg Oral QPM Dwyane Dee, MD   8 mg at 09/28/20 1811   dextromethorphan (DELSYM) 30 MG/5ML liquid 30 mg  30 mg Oral BID PRN Mignon Pine, DO   30 mg at 09/28/20 2315   diclofenac Sodium (VOLTAREN) 1 % topical gel 2 g  2 g Topical QID PRN Merlene Pulling K, PA-C   2 g at 09/27/20 1749   docusate sodium (COLACE) capsule 100 mg  100 mg Oral BID Marchia Bond, MD   100 mg at 09/29/20 0947   famotidine (PEPCID) tablet 20 mg  20 mg Oral Daily Dwyane Dee, MD   20 mg at 09/29/20 0946   fluconazole (DIFLUCAN) tablet 200 mg  200 mg Oral Daily Mignon Pine, DO   200 mg at 09/29/20 0947   fluticasone (FLONASE) 50 MCG/ACT nasal spray 2 spray  2 spray Each Nare Daily Dwyane Dee, MD   2 spray at [id number]   folic acid (  FOLVITE) tablet 1 mg  1 mg Oral Daily Dwyane Dee, MD   1 mg at 09/29/20 0947   gabapentin (NEURONTIN) capsule 600 mg  600 mg Oral QHS Dwyane Dee, MD   600 mg at 09/28/20 2300   guaiFENesin (MUCINEX) 12 hr tablet 600 mg  600 mg Oral BID Marchia Bond, MD   600 mg at 09/29/20 0947   insulin aspart (novoLOG) injection 0-15 Units  0-15 Units Subcutaneous TID WC Charlynne Cousins, MD   3 Units at 09/29/20 1204   insulin aspart (novoLOG) injection 0-5 Units  0-5 Units Subcutaneous QHS Charlynne Cousins, MD       insulin aspart (novoLOG) injection 3 Units  3 Units Subcutaneous TID WC Charlynne Cousins, MD   3 Units at 09/29/20 1206   insulin detemir (LEVEMIR) injection 5 Units  5 Units Subcutaneous QHS Charlynne Cousins, MD   5 Units at 09/28/20 2300   loratadine (CLARITIN) tablet 10 mg  10 mg Oral Daily Dwyane Dee, MD   10 mg at 09/29/20 0947   magnesium oxide (MAG-OX) tablet 400 mg  400 mg Oral Daily Dwyane Dee, MD   400 mg at 09/29/20 0946   mirtazapine (REMERON) tablet 30 mg  30 mg Oral Standley Brooking, MD   30 mg at 09/28/20 2300   montelukast (SINGULAIR) tablet 10 mg  10 mg Oral Daily Dwyane Dee, MD   10 mg at 09/29/20 [id number]   multivitamin with minerals tablet 1 tablet  1 tablet Oral Daily Marchia Bond, MD   1 tablet at 09/29/20 0946   ondansetron (ZOFRAN) tablet 4 mg  4 mg Oral Q6H PRN Marchia Bond, MD       Or   ondansetron [facility]) injection 4 mg  4 mg Intravenous Q6H PRN Marchia Bond, MD       pantoprazole (PROTONIX) EC tablet 40 mg  40 mg Oral QPM Dwyane Dee, MD   40 mg at 09/28/20 1811   polyethylene glycol (MIRALAX / GLYCOLAX) packet 17 g  17 g Oral Daily PRN Marchia Bond, MD       predniSONE (DELTASONE) tablet 12 mg  12 mg Oral Q breakfast Charlynne Cousins, MD   12 mg at 09/29/20 [id number]   rivaroxaban (XARELTO) tablet 20 mg  20 mg Oral Q supper Dimple Nanas, RPH   20 mg at 09/28/20 1811   sacubitril-valsartan (ENTRESTO) 49-51 mg per tablet  1 tablet Oral BID Croitoru, Mihai, MD   1 tablet at 09/29/20 0945   senna (SENOKOT) tablet 8.6 mg  1 tablet Oral QHS PRN Marchia Bond, MD       tetrahydrozoline 0.05 % ophthalmic solution 1 drop  1 drop Both Eyes BID Dwyane Dee, MD   1 drop at 09/29/20 0948   traMADol (ULTRAM) tablet 50 mg  50 mg Oral QHS PRN Dwyane Dee, MD   50 mg at 09/27/20 2225     Discharge Medications: Please see discharge summary for a list of discharge medications.  Relevant Imaging Results:  Relevant Lab Results:   Additional Information SSN: [id number]  Covid vaccinations x 4  Yadiel Aubry, Marjie Skiff, RN

## 2020-09-30 DIAGNOSIS — J441 Chronic obstructive pulmonary disease with (acute) exacerbation: Secondary | ICD-10-CM | POA: Diagnosis present

## 2020-09-30 DIAGNOSIS — E1122 Type 2 diabetes mellitus with diabetic chronic kidney disease: Secondary | ICD-10-CM | POA: Diagnosis present

## 2020-09-30 DIAGNOSIS — K219 Gastro-esophageal reflux disease without esophagitis: Secondary | ICD-10-CM | POA: Diagnosis not present

## 2020-09-30 DIAGNOSIS — E1151 Type 2 diabetes mellitus with diabetic peripheral angiopathy without gangrene: Secondary | ICD-10-CM | POA: Diagnosis present

## 2020-09-30 DIAGNOSIS — N179 Acute kidney failure, unspecified: Secondary | ICD-10-CM | POA: Diagnosis not present

## 2020-09-30 DIAGNOSIS — Z20822 Contact with and (suspected) exposure to covid-19: Secondary | ICD-10-CM | POA: Diagnosis present

## 2020-09-30 DIAGNOSIS — X58XXXA Exposure to other specified factors, initial encounter: Secondary | ICD-10-CM | POA: Diagnosis present

## 2020-09-30 DIAGNOSIS — M797 Fibromyalgia: Secondary | ICD-10-CM | POA: Diagnosis present

## 2020-09-30 DIAGNOSIS — E860 Dehydration: Secondary | ICD-10-CM | POA: Diagnosis present

## 2020-09-30 DIAGNOSIS — E782 Mixed hyperlipidemia: Secondary | ICD-10-CM | POA: Diagnosis not present

## 2020-09-30 DIAGNOSIS — I429 Cardiomyopathy, unspecified: Secondary | ICD-10-CM | POA: Diagnosis present

## 2020-09-30 DIAGNOSIS — E870 Hyperosmolality and hypernatremia: Secondary | ICD-10-CM | POA: Diagnosis not present

## 2020-09-30 DIAGNOSIS — F32A Depression, unspecified: Secondary | ICD-10-CM | POA: Diagnosis not present

## 2020-09-30 DIAGNOSIS — R0600 Dyspnea, unspecified: Secondary | ICD-10-CM | POA: Diagnosis not present

## 2020-09-30 DIAGNOSIS — Z86711 Personal history of pulmonary embolism: Secondary | ICD-10-CM | POA: Diagnosis not present

## 2020-09-30 DIAGNOSIS — I10 Essential (primary) hypertension: Secondary | ICD-10-CM | POA: Diagnosis not present

## 2020-09-30 DIAGNOSIS — J4540 Moderate persistent asthma, uncomplicated: Secondary | ICD-10-CM | POA: Diagnosis present

## 2020-09-30 DIAGNOSIS — D539 Nutritional anemia, unspecified: Secondary | ICD-10-CM | POA: Diagnosis present

## 2020-09-30 DIAGNOSIS — M0600 Rheumatoid arthritis without rheumatoid factor, unspecified site: Secondary | ICD-10-CM | POA: Diagnosis present

## 2020-09-30 DIAGNOSIS — I5043 Acute on chronic combined systolic (congestive) and diastolic (congestive) heart failure: Secondary | ICD-10-CM | POA: Diagnosis present

## 2020-09-30 DIAGNOSIS — N261 Atrophy of kidney (terminal): Secondary | ICD-10-CM | POA: Diagnosis not present

## 2020-09-30 DIAGNOSIS — R262 Difficulty in walking, not elsewhere classified: Secondary | ICD-10-CM | POA: Diagnosis not present

## 2020-09-30 DIAGNOSIS — K5730 Diverticulosis of large intestine without perforation or abscess without bleeding: Secondary | ICD-10-CM | POA: Diagnosis not present

## 2020-09-30 DIAGNOSIS — Z9889 Other specified postprocedural states: Secondary | ICD-10-CM | POA: Diagnosis not present

## 2020-09-30 DIAGNOSIS — I5042 Chronic combined systolic (congestive) and diastolic (congestive) heart failure: Secondary | ICD-10-CM | POA: Diagnosis not present

## 2020-09-30 DIAGNOSIS — E875 Hyperkalemia: Secondary | ICD-10-CM | POA: Diagnosis present

## 2020-09-30 DIAGNOSIS — I130 Hypertensive heart and chronic kidney disease with heart failure and stage 1 through stage 4 chronic kidney disease, or unspecified chronic kidney disease: Secondary | ICD-10-CM | POA: Diagnosis present

## 2020-09-30 DIAGNOSIS — M6281 Muscle weakness (generalized): Secondary | ICD-10-CM | POA: Diagnosis not present

## 2020-09-30 DIAGNOSIS — M069 Rheumatoid arthritis, unspecified: Secondary | ICD-10-CM | POA: Diagnosis not present

## 2020-09-30 DIAGNOSIS — E1169 Type 2 diabetes mellitus with other specified complication: Secondary | ICD-10-CM | POA: Diagnosis not present

## 2020-09-30 DIAGNOSIS — I959 Hypotension, unspecified: Secondary | ICD-10-CM | POA: Diagnosis present

## 2020-09-30 DIAGNOSIS — T380X5A Adverse effect of glucocorticoids and synthetic analogues, initial encounter: Secondary | ICD-10-CM | POA: Diagnosis present

## 2020-09-30 DIAGNOSIS — Z86718 Personal history of other venous thrombosis and embolism: Secondary | ICD-10-CM | POA: Diagnosis not present

## 2020-09-30 DIAGNOSIS — E119 Type 2 diabetes mellitus without complications: Secondary | ICD-10-CM | POA: Diagnosis not present

## 2020-09-30 DIAGNOSIS — L03113 Cellulitis of right upper limb: Secondary | ICD-10-CM | POA: Diagnosis not present

## 2020-09-30 DIAGNOSIS — R0602 Shortness of breath: Secondary | ICD-10-CM | POA: Diagnosis not present

## 2020-09-30 DIAGNOSIS — I420 Dilated cardiomyopathy: Secondary | ICD-10-CM | POA: Diagnosis not present

## 2020-09-30 DIAGNOSIS — K429 Umbilical hernia without obstruction or gangrene: Secondary | ICD-10-CM | POA: Diagnosis not present

## 2020-09-30 DIAGNOSIS — E785 Hyperlipidemia, unspecified: Secondary | ICD-10-CM | POA: Diagnosis not present

## 2020-09-30 DIAGNOSIS — L02511 Cutaneous abscess of right hand: Secondary | ICD-10-CM | POA: Diagnosis not present

## 2020-09-30 DIAGNOSIS — N1832 Chronic kidney disease, stage 3b: Secondary | ICD-10-CM | POA: Diagnosis present

## 2020-09-30 DIAGNOSIS — R739 Hyperglycemia, unspecified: Secondary | ICD-10-CM | POA: Diagnosis not present

## 2020-09-30 DIAGNOSIS — Z87442 Personal history of urinary calculi: Secondary | ICD-10-CM | POA: Diagnosis not present

## 2020-09-30 DIAGNOSIS — M71121 Other infective bursitis, right elbow: Secondary | ICD-10-CM | POA: Diagnosis not present

## 2020-09-30 DIAGNOSIS — N170 Acute kidney failure with tubular necrosis: Secondary | ICD-10-CM | POA: Diagnosis present

## 2020-09-30 DIAGNOSIS — Z743 Need for continuous supervision: Secondary | ICD-10-CM | POA: Diagnosis not present

## 2020-09-30 DIAGNOSIS — Z7901 Long term (current) use of anticoagulants: Secondary | ICD-10-CM | POA: Diagnosis not present

## 2020-09-30 DIAGNOSIS — D631 Anemia in chronic kidney disease: Secondary | ICD-10-CM | POA: Diagnosis present

## 2020-09-30 DIAGNOSIS — R2681 Unsteadiness on feet: Secondary | ICD-10-CM | POA: Diagnosis not present

## 2020-09-30 DIAGNOSIS — E039 Hypothyroidism, unspecified: Secondary | ICD-10-CM | POA: Diagnosis present

## 2020-09-30 DIAGNOSIS — R531 Weakness: Secondary | ICD-10-CM | POA: Diagnosis not present

## 2020-09-30 DIAGNOSIS — Z66 Do not resuscitate: Secondary | ICD-10-CM | POA: Diagnosis present

## 2020-09-30 DIAGNOSIS — Z853 Personal history of malignant neoplasm of breast: Secondary | ICD-10-CM | POA: Diagnosis not present

## 2020-09-30 DIAGNOSIS — R0902 Hypoxemia: Secondary | ICD-10-CM | POA: Diagnosis not present

## 2020-09-30 DIAGNOSIS — I517 Cardiomegaly: Secondary | ICD-10-CM | POA: Diagnosis not present

## 2020-09-30 LAB — GLUCOSE, CAPILLARY
Glucose-Capillary: 199 mg/dL — ABNORMAL HIGH (ref 70–99)
Glucose-Capillary: 83 mg/dL (ref 70–99)
Glucose-Capillary: 198 mg/dL — ABNORMAL HIGH (ref 70–99)

## 2020-09-30 LAB — RESP PANEL BY RT-PCR (FLU A&B, COVID) ARPGX2
Influenza A by PCR: NEGATIVE
Influenza B by PCR: NEGATIVE
SARS Coronavirus 2 by RT PCR: NEGATIVE

## 2020-09-30 MED ORDER — SACUBITRIL-VALSARTAN 49-51 MG PO TABS
1.0000 | ORAL_TABLET | Freq: Two times a day (BID) | ORAL | Status: DC
Start: 2020-09-30 — End: 2020-10-11

## 2020-09-30 MED ORDER — FUROSEMIDE 40 MG PO TABS: 40.0000 mg | ORAL_TABLET | Freq: Every day | ORAL | 3 refills | Status: DC

## 2020-09-30 MED ORDER — FLUCONAZOLE 200 MG PO TABS
200.0000 mg | ORAL_TABLET | Freq: Every day | ORAL | 0 refills | Status: DC
Start: 2020-09-30 — End: 2020-09-30

## 2020-09-30 MED ORDER — PREDNISONE 1 MG PO TABS: 12.0000 mg | ORAL_TABLET | Freq: Every day | ORAL | Status: DC

## 2020-09-30 MED ORDER — FLUCONAZOLE 200 MG PO TABS: 200.0000 mg | ORAL_TABLET | Freq: Every day | ORAL | 0 refills | Status: AC

## 2020-09-30 MED ORDER — BISOPROLOL FUMARATE 5 MG PO TABS: 2.5000 mg | ORAL_TABLET | Freq: Every day | ORAL | Status: DC

## 2020-09-30 NOTE — Care Management Important Message (Signed)
Important Message  Patient Details IM Letter given to the Patient. Name: Ebony Scott MRN: [id number] Date of Birth: November 25, 1950   Medicare Important Message Given:  Yes     Kerin Salen 09/30/2020, 9:52 AM

## 2020-09-30 NOTE — TOC Progression Note (Signed)
Transition of Care [facility]) - Progression Note    Patient Details  Name: AYVEN COUFAL MRN: [id number] Date of Birth: 1951/01/03  Transition of Care [facility]) CM/SW Contact  Isaly Fasching, Marjie Skiff, RN Phone Number: 09/30/2020, 10:24 AM  Clinical Narrative:    Provided SNF bed offers to both pt and daughter Joseph Art. [facility] chosen. [facility] liaison contacted to accept bed. Auth started with [facility]    Readmission Risk Interventions Readmission Risk Prevention Plan 09/26/2020  Transportation Screening Complete  Medication Review Press photographer) Complete  PCP or Specialist appointment within 3-5 days of discharge Complete  HRI or [place] Complete  SW Recovery Care/Counseling Consult Complete  [place] Not Applicable  Some recent data might be hidden

## 2020-09-30 NOTE — TOC Transition Note (Signed)
Transition of Care [facility]) - CM/SW Discharge Note   Patient Details  Name: TERRYLEE ARENDER MRN: [id number] Date of Birth: [birth year]  [facility]) CM/SW Contact:  Lynnell Catalan, RN Phone Number: 09/30/2020, 1:40 PM   Clinical Narrative:    Pt to dc to [facility] room 3234. RN to call report to [phone]. Yellow DNR on the chart for transport. [facility] contacted for DC.   Final next level of care: Skilled Nursing Facility Barriers to Discharge: No Barriers Identified   Patient Goals and CMS Choice Patient states their goals for this hospitalization and ongoing recovery are:: To get better      Discharge Placement              Patient chooses bed at: [facility] Patient to be transferred to facility by: [facility] Name of family member notified: [name] Daughter Patient and family notified of of transfer: 09/30/20  Discharge Plan and Services   Discharge Planning Services: CM Consult                                 Social Determinants of Health ([place]) Interventions     Readmission Risk Interventions Readmission Risk Prevention Plan 09/26/2020  Transportation Screening Complete  Medication Review Press photographer) Complete  PCP or Specialist appointment within 3-5 days of discharge Complete  HRI or [place] Complete  SW Recovery Care/Counseling Consult Complete  [place] Not Applicable  Some recent data might be hidden

## 2020-09-30 NOTE — Progress Notes (Signed)
Subjective: 8 Days Post-Op s/p Procedure(s): IRRIGATION AND DEBRIDEMENT RIGHT HAND AND ELBOW   Patient is significantly more alert today, oriented. Appears much more close to her baseline than the last 2 days. Denies any pain, no pain at right fifth finger today. Denies CP, SOB, nausea, vomiting, or belly pain. No new complaints.  Objective:  PE: VITALS:   Vitals:   09/29/20 1358 09/29/20 2109 09/29/20 2153 09/30/20 0501  BP: (!) 104/47 (!) 83/47 124/89 (!) 106/55  Pulse: 64 68 71 73  Resp: 16 18    Temp: 99.1 F (37.3 C) 98.4 F (36.9 C)  97.9 F (36.6 C)  TempSrc: Oral Oral  Axillary  SpO2: 92% 98%  99%  Weight:    64.3 kg  Height:       General: sitting up in bed, in no acute distres MSK: In splint, splint adjusted for better coverage of right hand. Able to flex, extend, and abduct all fingers of right hand, though motion limited due to RA. No TTP to any aspect of right 5th finger. Distal sensation intact. Capillary refill intact.   LABS  Results for orders placed or performed during the hospital encounter of 09/22/20 (from the past 24 hour(s))  Basic metabolic panel     Status: Abnormal   Collection Time: 09/29/20  9:14 AM  Result Value Ref Range   Sodium 137 135 - 145 mmol/L   Potassium 4.4 3.5 - 5.1 mmol/L   Chloride 99 98 - 111 mmol/L   CO2 25 22 - 32 mmol/L   Glucose, Bld 214 (H) 70 - 99 mg/dL   BUN 45 (H) 8 - 23 mg/dL   Creatinine, Ser 1.83 (H) 0.44 - 1.00 mg/dL   Calcium 9.2 8.9 - 10.3 mg/dL   GFR, Estimated 29 (L) >60 mL/min   Anion gap 13 5 - 15  Glucose, capillary     Status: Abnormal   Collection Time: 09/29/20 11:42 AM  Result Value Ref Range   Glucose-Capillary 185 (H) 70 - 99 mg/dL  Glucose, capillary     Status: Abnormal   Collection Time: 09/29/20  4:24 PM  Result Value Ref Range   Glucose-Capillary 209 (H) 70 - 99 mg/dL  Glucose, capillary     Status: Abnormal   Collection Time: 09/29/20  9:08 PM  Result Value Ref Range    Glucose-Capillary 199 (H) 70 - 99 mg/dL   Comment 1 Notify RN    Comment 2 Document in Chart   Glucose, capillary     Status: None   Collection Time: 09/30/20  7:31 AM  Result Value Ref Range   Glucose-Capillary 83 70 - 99 mg/dL   *Note: Due to a large number of results and/or encounters for the requested time period, some results have not been displayed. A complete set of results can be found in Results Review.    DG Foot Complete Right  Result Date: 09/28/2020 CLINICAL DATA:  RIGHT foot pain. EXAM: RIGHT FOOT COMPLETE - 3+ VIEW COMPARISON:  Foot radiograph 07/20/2020 FINDINGS: Chronic fractures at the base of the fourth and fifth metatarsal. No acute fracture dislocation. No soft tissue abnormality. IMPRESSION: 1. No acute findings of the RIGHT foot. 2. Chronic fractures of the base of the fourth and fifth metatarsal. Electronically Signed   By: Suzy Bouchard M.D.   On: 09/28/2020 16:05    Assessment/Plan: Septic right elbow olecranon bursitis Right dorsal infected rheumatoid nodule, hand, overlying the third metacarpal phalangeal joint. 8 Days Post-Op  s/p Procedure(s): IRRIGATION AND DEBRIDEMENT RIGHT HAND AND ELBOW - cultures growing candida albicans, ID following, sent off for sensitivities - patient appears significantly more alert today Weightbearing: WBAT RUE Insicional and dressing care:  Ok to change every 24-48 hours at home with new xeroform, 4x4 gauze, abd's, and gauze wrap with posterior splint over it.  VTE prophylaxis: Home xarelto Pain control: continue current regimen, tramadol only at bedtime Follow - up plan: 1 week after discharge with Dr. Mardelle Matte Dispo: patient to discharge to SNF today or tomorrow   Ventura Bruns 09/30/2020, 8:52 AM

## 2020-09-30 NOTE — Progress Notes (Signed)
Nutrition Brief Note  Patient identified on the Malnutrition Screening Tool (MST) Report.  Wt Readings from Last 15 Encounters:  09/30/20 64.3 kg  09/06/20 64.9 kg  08/30/20 65.3 kg  08/05/20 66.7 kg  06/14/20 64.9 kg  04/29/20 63.8 kg  04/26/20 64 kg  04/14/20 62.3 kg  04/05/20 59.9 kg  04/01/20 62.1 kg  03/31/20 62.1 kg  03/25/20 60.8 kg  03/21/20 60.1 kg  03/15/20 60.7 kg  02/16/20 67.3 kg    Body mass index is 26.78 kg/m. Patient meets criteria for overweight status based on current BMI.   She was admitted for septic bursitis of R elbow s/p I&D on 8/4 by Orthopedic surgery.   Current diet order is Regular and she has been eating mainly 75-100% at meals since 8/7. Labs and medications reviewed.   Discharge order and discharge summary have been entered this AM for d/c to SNF.   No nutrition interventions warranted at this time. If nutrition issues arise, please consult RD.       Jarome Matin, MS, RD, LDN, CNSC Inpatient Clinical Dietitian RD pager # available in [place]  After hours/weekend pager # available in [facility]

## 2020-09-30 NOTE — Progress Notes (Signed)
Report given to Romie Minus at [facility]

## 2020-09-30 NOTE — Consult Note (Signed)
[facility] Inpatient Consult   09/30/2020  Ebony Scott 1951/02/01 [id number]  [place] Organization [ACO] Patient: [employer]   Patient screened for hospitalization with noted extreme high risk score for unplanned readmission risk and to assess for potential [facility] CM) service needs for post hospital transition.  Review of patient's medical record reveals patient is currently recommended for SNF. No [facility] CM needs at this time.  Netta Cedars, MSN, [place] [facility] Liaison Nurse Mobile Phone [phone]  Toll free office [phone]

## 2020-09-30 NOTE — Discharge Summary (Signed)
Physician Discharge Summary  Ebony Scott BVQ:[id number] DOB: May 13, 1950 DOA: 09/22/2020  PCP: [name], MD  Admit date: 09/22/2020 Discharge date: 09/30/2020  Admitted From: Home Disposition:  SNF  Recommendations for Outpatient Follow-up:  Follow up with PCP in 1-2 weeks Please obtain BMP/CBC in one week Palliative care to follow-up with patient at facility   Home Health:No Equipment/Devices:None Discharge Condition:Stable CODE STATUS:DNR Diet recommendation: Heart Healthy  Brief/Interim Summary: 70 y.o. female past medical history of DVT anxiety, rheumatoid arthritis breast cancer C. difficile referral vascular disease tracheal bronchomalacia who presents with right hand pain and swelling, seen by Ortho and had multiple I&D 1 being 3 weeks ago since then she is noticing increased swelling and erythema of the arm, Ortho admitted her for debridement of her right hand and elbow perform I&D on 09/22/2020 due to septic olecranon bursitis with cultures growing Candida albicans susceptible to fluconazole ID was consulted for inpatient assistance  Discharge Diagnoses:  Principal Problem:   Septic olecranon bursitis of right elbow Active Problems:   Hyperlipidemia   GERD   Chronic combined systolic and diastolic CHF (congestive heart failure) (HCC)   Chronic kidney disease, stage 3b (HCC)   Pulmonary nodules   Hypothyroidism   Rheumatoid arthritis ([place])   Tracheobronchomalacia   Cellulitis of right hand   Abscess of dorsum of right hand   Osteomyelitis of second toe of right foot ([place])   DMII (diabetes mellitus, type 2) ([place])   History of DVT (deep vein thrombosis)   Physical deconditioning  Septic olecranon's bursitis of the right elbow: Underwent I&D on 09/22/2021 by orthopedic surgery. Infectious disease was also consulted.  Cultures grew Candida albicans ID recommended to continue fluconazole for several weeks. She will follow-up with ID and orthopedic as an outpatient.   End date 10/20/2020 for fluconazole follow-up with ID as an outpatient.  Abscess of the dorsum of the right hand: Status post excisional debridement ID on board recommended to discontinue daptomycin and continue fluconazole. No follow-up with ID as an outpatient.  Chronic combined systolic and diastolic heart failure with a 2D echo showing an EF of 35%: Cardiology was consulted she was started on Entresto and Lasix. She diuresed about 2 L, she will continue Entresto, Lasix and bisoprolol as an outpatient. Cardiology recommended ischemic evaluation as an outpatient once infection is controlled.  Physical deconditioning: Physical therapy evaluated the patient recommended skilled nursing facility which the patient agreed.  History of DVT: Continue Xarelto.  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 Follow-up with podiatrist as an outpatient. Continue Betadine wet-to-dry dressing changes.  Rheumatoid arthritis: Treated with Rituxan and prednisone daily.  Hyperlipidemia: Continue Lipitor.  Diabetes mellitus type 2: With a last A1c of 8.4, Farxiga was held in the setting of yeast infection. She is currently on long-acting insulin plus sliding scale blood glucose continue to be erratic in the setting of steroids.   History of tracheomalacia: Continue Stiltol.   Pulmonary nodule: Follow-up with pulmonary as an outpatient status post BAL on 04/05/2020 that grew Candida albicans. Repeated CT on 05/25/2020 showed improvement in decreased size of nodule.   Chronic kidney disease stage IIIb: With a baseline creatinine around 1.2 on admission 1.6. Now returning to baseline.    Discharge Instructions  Discharge Instructions     Diet - low sodium heart healthy   Complete by: As directed    Discharge wound care:   Complete by: As directed    Per wound care nurse  instructions   Increase activity slowly   Complete by: As directed        Allergies as of 09/30/2020       Reactions   Baclofen Other (See Comments)   Altered mental status requiring a 3-day hospital stay- patient became unresponsive   Dust Mite Extract Shortness Of Breath, Other (See Comments)   "sneezing" (02/20/2012)   Molds & Smuts Shortness Of Breath   Morphine And Related Hives, Itching   Other Shortness Of Breath, Other (See Comments)   Grass and weeds "sneezing; filled sinuses" (02/20/2012)   Penicillins Rash, Other (See Comments)   "welts" (02/20/2012) Tolerated Ancef 02/16/20.   Rofecoxib Swelling   Vioxx REACTION: feet swelling Other reaction(s): Unknown Other reaction(s): Unknown   Shellfish Allergy Anaphylaxis, Shortness Of Breath, Itching, Rash   Shrimp Flavor Anaphylaxis, Other (See Comments)   ALL SHELLFISH, also   Tetracycline Hcl Nausea And Vomiting   Xolair [omalizumab] Other (See Comments)   Caused Blood clot   Zoledronic Acid Other (See Comments)   Fever, Put in hospital, dr said it was a reaction from a reaction  Other reaction(s): Unknown   Dilaudid [hydromorphone Hcl] Itching   Hydrocodone-acetaminophen Nausea And Vomiting   Levofloxacin Other (See Comments)   GI upset Other reaction(s): Unknown   Oxycodone Hcl Nausea And Vomiting   Paroxetine Nausea And Vomiting   Paxil   Celecoxib Swelling   Feet swelling   Diltiazem Swelling   Diltiazem Hcl    Other reaction(s): Unknown   Hydrocodone-acetaminophen    Other reaction(s): Unknown   Lactose Intolerance (gi) Other (See Comments)   Bloating and gas   Morphine Sulfate Other (See Comments)   "Gets loopy"   Oxycodone-acetaminophen    Other reaction(s): Unknown   Penicillin G Procaine    Other reaction(s): Unknown   Strawberry Flavor    Tree Extract Other (See Comments)   "tested and told I was allergic to it; never experienced a reaction to it" (02/20/2012)        Medication List     STOP taking these medications    acetic acid-hydrocortisone OTIC  solution Commonly known as: VOSOL-HC   AeroChamber Plus inhaler   ALIGN PO   Alpha-Lipoic Acid 600 MG Caps   BEANO PO   Flutter Devi   Gerhardt's butt cream Crea   Lactase 9000 units Tabs   Lancets Misc   mupirocin ointment 2 % Commonly known as: BACTROBAN   nystatin 100000 UNIT/ML suspension Commonly known as: MYCOSTATIN   nystatin-triamcinolone ointment Commonly known as: MYCOLOG   OVER THE COUNTER MEDICATION   Pfizer-BioNTech COVID-19 Vacc 30 MCG/0.3ML injection Generic drug: COVID-19 mRNA vaccine Therapist, music)   prenatal multivitamin Tabs tablet       TAKE these medications    acetaminophen 650 MG CR tablet Commonly known as: TYLENOL Take 1,300 mg by mouth 2 (two) times daily.   albuterol 108 (90 Base) MCG/ACT inhaler Commonly known as: VENTOLIN HFA INHALE 2 PUFFS BY MOUTH INTO THE LUNGS EVERY 4 HOURS AS NEEDED FOR WHEEZING OR SHORTNESS OF BREATH What changed:  See the new instructions. Another medication with the same name was removed. Continue taking this medication, and follow the directions you see here.   allopurinol 100 MG tablet Commonly known as: ZYLOPRIM Take 100 mg by mouth daily.   atorvastatin 10 MG tablet Commonly known as: LIPITOR TAKE 1 TABLET BY MOUTH DAILY   azelastine 0.1 % nasal spray Commonly known as: ASTELIN USE 2 SPRAYS IN EACH NOSTRIL  DAILY AS DIRECTED What changed: See the new instructions.   bisoprolol 5 MG tablet Commonly known as: ZEBETA Take 0.5 tablets (2.5 mg total) by mouth daily.   Blood Glucose System Pak Kit Please dispense per patient and insurance preference. Use as directed to monitor FSBS 4x . Dx: E11.65   BLOOD GLUCOSE TEST STRIPS Strp Please dispense per patient and insurance preference. Use as directed to monitor FSBS 4x . Dx: E11.65   cetirizine 10 MG tablet Commonly known as: ZYRTEC Take 10 mg by mouth daily.   cholecalciferol 25 MCG (1000 UNIT) tablet Commonly known as: VITAMIN D Take 1,000  Units by mouth daily.   cyproheptadine 4 MG tablet Commonly known as: PERIACTIN TAKE 2 TABLETS BY MOUTH EVERY EVENING   Dexilant 60 MG capsule Generic drug: dexlansoprazole TAKE 1 CAPSULE BY MOUTH EACH MORNING What changed: See the new instructions.   diclofenac Sodium 1 % Gel Commonly known as: VOLTAREN Apply 2 g topically 4 (four) times daily as needed (joint pain).   EPINEPHrine 0.3 mg/0.3 mL Soaj injection Commonly known as: EpiPen 2-Pak Inject 0.3 mLs (0.3 mg total) into the muscle Once PRN. What changed: reasons to take this   famotidine 20 MG tablet Commonly known as: PEPCID TAKE 1 TABLET BY MOUTH TWICE A DAY What changed: when to take this   fluconazole 200 MG tablet Commonly known as: DIFLUCAN Take 1 tablet (200 mg total) by mouth daily for 21 days.   fluticasone 50 MCG/ACT nasal spray Commonly known as: FLONASE USE 2 SPRAYS IN EACH NOSTRIL DAILY   folic acid 1 MG tablet Commonly known as: FOLVITE Take 1 mg by mouth daily.   FreeStyle Libre 14 Day Reader Kerrin Mo Apply 1 each topically daily as needed. USE AS DIRECTED TO MONITOR BLOOD GLUCOSE LEVELS. DX: E11.9.   FreeStyle Libre 14 Day Sensor Misc Apply 1 each topically every 14 (fourteen) days. USE AS DIRECTED TO MONITOR BLOOD GLUCOSE LEVELS. DX: E11.9.   furosemide 40 MG tablet Commonly known as: LASIX Take 1 tablet (40 mg total) by mouth daily. 80 mg in the AM and 40 mg in the PM Start taking on: October 02, 2020 What changed: These instructions start on October 02, 2020. If you are unsure what to do until then, ask your doctor or other care provider.   gabapentin 600 MG tablet Commonly known as: NEURONTIN TAKE 1 TABLET BY MOUTH DAILY What changed: when to take this   guaiFENesin 600 MG 12 hr tablet Commonly known as: MUCINEX Take 1 tablet (600 mg total) by mouth 2 (two) times daily as needed for cough or to loosen phlegm. What changed: when to take this   leflunomide 20 MG tablet Commonly known as:  ARAVA Take 20 mg by mouth daily.   magnesium oxide 400 MG tablet Commonly known as: MAG-OX Take 400 mg by mouth daily.   meclizine 25 MG tablet Commonly known as: ANTIVERT Take 25 mg by mouth 3 (three) times daily as needed for dizziness.   mirtazapine 30 MG disintegrating tablet Commonly known as: REMERON SOL-TAB DISSOLVE 1 TABLET BY MOUTH EVERY NIGHT AT BEDTIME What changed: See the new instructions.   Mitigare 0.6 MG Caps Generic drug: Colchicine Take 0.6 mg by mouth every other day.   montelukast 10 MG tablet Commonly known as: SINGULAIR TAKE 1 TABLET BY MOUTH DAILY   predniSONE 1 MG tablet Commonly known as: DELTASONE Take 12 tablets (12 mg total) by mouth daily with breakfast. What changed:  medication strength  how much to take   sacubitril-valsartan 49-51 MG Commonly known as: ENTRESTO Take 1 tablet by mouth 2 (two) times daily.   Stiolto Respimat 2.5-2.5 MCG/ACT Aers Generic drug: Tiotropium Bromide-Olodaterol Inhale 2 puffs into the lungs daily. What changed: when to take this   Systane Complete 0.6 % Soln Generic drug: Propylene Glycol Place 1 drop into both eyes 2 (two) times daily.   tiotropium 18 MCG inhalation capsule Commonly known as: SPIRIVA Place 18 mcg into inhaler and inhale daily.   traMADol 50 MG tablet Commonly known as: ULTRAM Take 50 mg by mouth at bedtime.   Xarelto 20 MG Tabs tablet Generic drug: rivaroxaban TAKE 1 TABLET BY MOUTH DAILY WITH SUPPER What changed:  how much to take when to take this               Discharge Care Instructions  (From admission, onward)           Start     Ordered   09/30/20 0000  Discharge wound care:       Comments: Per wound care nurse instructions   09/30/20 0841            Follow-up Information     Marchia Bond, MD. Schedule an appointment as soon as possible for a visit in 2 week(s).   Specialty: Orthopedic Surgery Contact information: [place] [address] [phone]                Allergies  Allergen Reactions   Baclofen Other (See Comments)    Altered mental status requiring a 3-day hospital stay- patient became unresponsive   Dust Mite Extract Shortness Of Breath and Other (See Comments)    "sneezing" (02/20/2012)   Molds & Smuts Shortness Of Breath   Morphine And Related Hives and Itching   Other Shortness Of Breath and Other (See Comments)    Grass and weeds "sneezing; filled sinuses" (02/20/2012)   Penicillins Rash and Other (See Comments)    "welts" (02/20/2012) Tolerated Ancef 02/16/20.    Rofecoxib Swelling    Vioxx REACTION: feet swelling Other reaction(s): Unknown Other reaction(s): Unknown   Shellfish Allergy Anaphylaxis, Shortness Of Breath, Itching and Rash   Shrimp Flavor Anaphylaxis and Other (See Comments)    ALL SHELLFISH, also   Tetracycline Hcl Nausea And Vomiting   Xolair [Omalizumab] Other (See Comments)    Caused Blood clot   Zoledronic Acid Other (See Comments)    Fever, Put in hospital, dr said it was a reaction from a reaction  Other reaction(s): Unknown   Dilaudid [Hydromorphone Hcl] Itching   Hydrocodone-Acetaminophen Nausea And Vomiting   Levofloxacin Other (See Comments)    GI upset Other reaction(s): Unknown   Oxycodone Hcl Nausea And Vomiting   Paroxetine Nausea And Vomiting    Paxil    Celecoxib Swelling    Feet swelling   Diltiazem Swelling   Diltiazem Hcl     Other reaction(s): Unknown   Hydrocodone-Acetaminophen     Other reaction(s): Unknown   Lactose Intolerance (Gi) Other (See Comments)    Bloating and gas   Morphine Sulfate Other (See Comments)    "Gets loopy"   Oxycodone-Acetaminophen     Other reaction(s): Unknown   Penicillin G Procaine     Other reaction(s): Unknown   Strawberry Flavor    Tree Extract Other (See Comments)    "tested and told I was allergic to it; never experienced a reaction to it" (02/20/2012)  Consultations: Infectious disease Cardiology Orthopedic surgery   Procedures/Studies: [facility] Chest Port 1 View  Result Date: 09/22/2020 CLINICAL DATA:  Questionable sepsis.  Evaluate for abnormality. EXAM: PORTABLE CHEST 1 VIEW COMPARISON:  Chest CT 08/23/20 FINDINGS: Electronic device is positioned over the right upper lobe which precludes evaluation of this area. Stable cardiac enlargement. No pleural effusion or edema. No airspace opacities identified. Remote left posterior rib fractures. Previous right shoulder arthroplasty. IMPRESSION: No acute cardiopulmonary abnormalities. Electronically Signed   By: Kerby Moors M.D.   On: 09/22/2020 14:23   DG Hand Complete Right  Result Date: 09/22/2020 CLINICAL DATA:  Questionable sepsis - evaluate for abnormality EXAM: RIGHT HAND - COMPLETE 3+ VIEW COMPARISON:  Radiograph 05/25/2020 FINDINGS: There is no acute fracture or dislocation. Prior resection of the trapezium. Mild radiocarpal and intercarpal joint degenerative change. There is volar subluxation of the middle finger MCP joint and possibly the index finger MCP joint. There is moderate-severe interphalangeal joint degenerative change, worst at the proximal phalangeal joints of the third through fifth digits. There is prominent dorsal soft tissue swelling. There are soft tissue calcifications along the thumb and heterotopic ossification along the volar soft tissues adjacent to the fourth metacarpal, unchanged from prior exam. There is no new osseous erosion or destruction. IMPRESSION: Prominent dorsal soft tissue swelling. Scattered osteoarthritis with prior trapeziectomy. Volar subluxation of the third MCP joint and possibly the index finger MCP joint which may be chronic. No radiographic evidence of osteomyelitis. Electronically Signed   By: Maurine Simmering   On: 09/22/2020 14:29   DG Foot Complete Right  Result Date: 09/28/2020 CLINICAL DATA:  RIGHT foot pain. EXAM: RIGHT FOOT COMPLETE - 3+ VIEW  COMPARISON:  Foot radiograph 07/20/2020 FINDINGS: Chronic fractures at the base of the fourth and fifth metatarsal. No acute fracture dislocation. No soft tissue abnormality. IMPRESSION: 1. No acute findings of the RIGHT foot. 2. Chronic fractures of the base of the fourth and fifth metatarsal. Electronically Signed   By: Suzy Bouchard M.D.   On: 09/28/2020 16:05   ECHOCARDIOGRAM COMPLETE  Result Date: 09/16/2020    ECHOCARDIOGRAM REPORT   Patient Name:   Ebony Scott  Date of Exam: 09/16/2020 Medical Rec #:  [id number]     Height:       61.0 in Accession #:    [id number]    Weight:       143.0 lb Date of Birth:  Jun 29, 1950      BSA:          1.638 m Patient Age:    5 years      BP:           128/72 mmHg Patient Gender: F             HR:           96 bpm. Exam Location:  [address] Procedure: 2D Echo, Cardiac Doppler, Color Doppler and Intracardiac            Opacification Agent Indications:    R06.00 Dyspnea  History:        Patient has prior history of Echocardiogram examinations, most                 recent 07/10/2019. CHF, Signs/Symptoms:Dyspnea and Edema; Risk                 Factors:Family History of Coronary Artery Disease and  Dyslipidemia. DVT, Left Breast Cancer status post Lumpectomy.  Sonographer:    Deliah Boston RDCS Referring Phys: [place]  1. Septal apical and anterior wall hypokinesis EF decreased compard to TTE done 07/10/19 . Left ventricular ejection fraction, by estimation, is 35 to 40%. The left ventricle has moderately decreased function. The left ventricle demonstrates regional wall motion abnormalities (see scoring diagram/findings for description). The left ventricular internal cavity size was moderately dilated. Left ventricular diastolic parameters are consistent with Grade II diastolic dysfunction (pseudonormalization). Elevated left ventricular end-diastolic pressure.  2. Right ventricular systolic function is normal. The right  ventricular size is normal.  3. Left atrial size was mildly dilated.  4. The mitral valve is abnormal. Mild mitral valve regurgitation. No evidence of mitral stenosis.  5. The aortic valve is tricuspid. There is moderate calcification of the aortic valve. Aortic valve regurgitation is not visualized. Mild to moderate aortic valve sclerosis/calcification is present, without any evidence of aortic stenosis.  6. The inferior vena cava is normal in size with greater than 50% respiratory variability, suggesting right atrial pressure of 3 mmHg. FINDINGS  Left Ventricle: Septal apical and anterior wall hypokinesis EF decreased compard to TTE done 07/10/19. Left ventricular ejection fraction, by estimation, is 35 to 40%. The left ventricle has moderately decreased function. The left ventricle demonstrates regional wall motion abnormalities. Definity contrast agent was given IV to delineate the left ventricular endocardial borders. The left ventricular internal cavity size was moderately dilated. There is no left ventricular hypertrophy. Left ventricular diastolic parameters are consistent with Grade II diastolic dysfunction (pseudonormalization). Elevated left ventricular end-diastolic pressure. Right Ventricle: The right ventricular size is normal. No increase in right ventricular wall thickness. Right ventricular systolic function is normal. Left Atrium: Left atrial size was mildly dilated. Right Atrium: Right atrial size was normal in size. Pericardium: There is no evidence of pericardial effusion. Mitral Valve: The mitral valve is abnormal. There is mild thickening of the mitral valve leaflet(s). There is mild calcification of the mitral valve leaflet(s). Mild mitral valve regurgitation. No evidence of mitral valve stenosis. Tricuspid Valve: The tricuspid valve is normal in structure. Tricuspid valve regurgitation is not demonstrated. No evidence of tricuspid stenosis. Aortic Valve: The aortic valve is tricuspid. There  is moderate calcification of the aortic valve. Aortic valve regurgitation is not visualized. Mild to moderate aortic valve sclerosis/calcification is present, without any evidence of aortic stenosis. Pulmonic Valve: The pulmonic valve was normal in structure. Pulmonic valve regurgitation is not visualized. No evidence of pulmonic stenosis. Aorta: The aortic root is normal in size and structure. Venous: The inferior vena cava is normal in size with greater than 50% respiratory variability, suggesting right atrial pressure of 3 mmHg. IAS/Shunts: No atrial level shunt detected by color flow Doppler.  LEFT VENTRICLE PLAX 2D LVIDd:         5.40 cm  Diastology LVIDs:         4.30 cm  LV e' medial:    4.68 cm/s LV PW:         0.90 cm  LV E/e' medial:  21.9 LV IVS:        0.80 cm  LV e' lateral:   5.11 cm/s LVOT diam:     2.00 cm  LV E/e' lateral: 20.1 LV SV:         60 LV SV Index:   36 LVOT Area:     3.14 cm  RIGHT VENTRICLE RV S prime:  15.10 cm/s TAPSE (M-mode): 2.1 cm LEFT ATRIUM             Index       RIGHT ATRIUM           Index LA diam:        4.10 cm 2.50 cm/m  RA Area:     10.80 cm LA Vol (A2C):   86.4 ml 52.75 ml/m RA Volume:   22.20 ml  13.55 ml/m LA Vol (A4C):   66.3 ml 40.48 ml/m LA Biplane Vol: 81.5 ml 49.76 ml/m  AORTIC VALVE LVOT Vmax:   90.15 cm/s LVOT Vmean:  61.300 cm/s LVOT VTI:    0.190 m  AORTA Ao Root diam: 2.90 cm Ao Asc diam:  3.10 cm MITRAL VALVE MV Area (PHT): cm          SHUNTS MV Decel Time: 167 msec     Systemic VTI:  0.19 m MV E velocity: 102.53 cm/s  Systemic Diam: 2.00 cm MV A velocity: 122.67 cm/s MV E/A ratio:  0.84 Jenkins Rouge MD Electronically signed by Jenkins Rouge MD Signature Date/Time: 09/16/2020/4:14:28 PM    Final    (Echo, Carotid, EGD, Colonoscopy, ERCP)    Subjective: No complaints  Discharge Exam: Vitals:   09/29/20 2153 09/30/20 0501  BP: 124/89 (!) 106/55  Pulse: 71 73  Resp:    Temp:  97.9 F (36.6 C)  SpO2:  99%   Vitals:   09/29/20 1358  09/29/20 2109 09/29/20 2153 09/30/20 0501  BP: (!) 104/47 (!) 83/47 124/89 (!) 106/55  Pulse: 64 68 71 73  Resp: 16 18    Temp: 99.1 F (37.3 C) 98.4 F (36.9 C)  97.9 F (36.6 C)  TempSrc: Oral Oral  Axillary  SpO2: 92% 98%  99%  Weight:    64.3 kg  Height:        General: Pt is alert, awake, not in acute distress Cardiovascular: RRR, S1/S2 +, no rubs, no gallops Respiratory: CTA bilaterally, no wheezing, no rhonchi Abdominal: Soft, NT, ND, bowel sounds + Extremities: no edema, no cyanosis    The results of significant diagnostics from this hospitalization (including imaging, microbiology, ancillary and laboratory) are listed below for reference.     Microbiology: Recent Results (from the past 240 hour(s))  Blood Culture (routine x 2)     Status: None   Collection Time: 09/22/20  1:03 PM   Specimen: BLOOD  Result Value Ref Range Status   Specimen Description   Final    BLOOD RIGHT ANTECUBITAL Performed at [facility] [address] [place], [place] [address]    Special Requests   Final    BOTTLES DRAWN AEROBIC AND ANAEROBIC Blood Culture adequate volume Performed at [facility] [address], [place], [place] [address]    Culture   Final    NO GROWTH 5 DAYS Performed at [facility], [place] [address], [place], [place] [address]    Report Status 09/27/2020 FINAL  Final  Resp Panel by RT-PCR (Flu A&B, Covid) Nasopharyngeal Swab     Status: None   Collection Time: 09/22/20  1:04 PM   Specimen: Nasopharyngeal Swab; Nasopharyngeal(NP) swabs in vial transport medium  Result Value Ref Range Status   SARS Coronavirus 2 by RT PCR NEGATIVE NEGATIVE Final    Comment: (NOTE) SARS-CoV-2 target nucleic acids are NOT DETECTED.  The SARS-CoV-2 RNA is generally detectable in upper respiratory specimens during the acute phase of infection. The  lowest concentration of SARS-CoV-2 viral copies this assay can detect is 138 copies/mL. A  negative result does not preclude SARS-Cov-2 infection and should not be used as the sole basis for treatment or other patient management decisions. A negative result may occur with  improper specimen collection/handling, submission of specimen other than nasopharyngeal swab, presence of viral mutation(s) within the areas targeted by this assay, and inadequate number of viral copies(<138 copies/mL). A negative result must be combined with clinical observations, patient history, and epidemiological information. The expected result is Negative.  Fact Sheet for Patients:  EntrepreneurPulse.com.au  Fact Sheet for Healthcare Providers:  IncredibleEmployment.be  This test is no t yet approved or cleared by the [place] FDA and  has been authorized for detection and/or diagnosis of SARS-CoV-2 by FDA under an Emergency Use Authorization (EUA). This EUA will remain  in effect (meaning this test can be used) for the duration of the COVID-19 declaration under Section 564(b)(1) of the Act, 21 U.S.C.section 360bbb-3(b)(1), unless the authorization is terminated  or revoked sooner.       Influenza A by PCR NEGATIVE NEGATIVE Final   Influenza B by PCR NEGATIVE NEGATIVE Final    Comment: (NOTE) The Xpert Xpress SARS-CoV-2/FLU/RSV plus assay is intended as an aid in the diagnosis of influenza from Nasopharyngeal swab specimens and should not be used as a sole basis for treatment. Nasal washings and aspirates are unacceptable for Xpert Xpress SARS-CoV-2/FLU/RSV testing.  Fact Sheet for Patients: EntrepreneurPulse.com.au  Fact Sheet for Healthcare Providers: IncredibleEmployment.be  This test is not yet approved or cleared by the [place] FDA and has been authorized for detection and/or diagnosis of SARS-CoV-2 by FDA under an Emergency Use Authorization (EUA). This EUA will remain in effect (meaning this test can  be used) for the duration of the COVID-19 declaration under Section 564(b)(1) of the Act, 21 U.S.C. section 360bbb-3(b)(1), unless the authorization is terminated or revoked.  Performed at [facility], [place] [address], [place], [place] [address]   Blood Culture (routine x 2)     Status: None   Collection Time: 09/22/20  1:08 PM   Specimen: BLOOD  Result Value Ref Range Status   Specimen Description   Final    BLOOD LEFT ANTECUBITAL Performed at [place] [address], [place], [place] [address]    Special Requests   Final    BOTTLES DRAWN AEROBIC AND ANAEROBIC Blood Culture adequate volume Performed at [place] [address] [place], [place] [address]    Culture   Final    NO GROWTH 5 DAYS Performed at [facility], [place] [address], [place], [place] [address]    Report Status 09/27/2020 FINAL  Final  Fungus Culture With Stain     Status: None (Preliminary result)   Collection Time: 09/22/20 10:05 PM   Specimen: Synovial, Right Elbow; Tissue  Result Value Ref Range Status   Fungus Stain Final report  Final    Comment: (NOTE) Performed At: [facility] [address] [place], [place] [id number] Rush Farmer MD MV:[id number]    Fungus (Mycology) Culture PENDING  Incomplete   Fungal Source HAND  Final    Comment: Performed at [facility], [place] [address], [place], [place] [address]  Aerobic/Anaerobic Culture w Gram Stain (surgical/deep wound)     Status: None (Preliminary result)   Collection Time: 09/22/20 10:05 PM   Specimen: Synovial, Right Elbow; Tissue  Result Value Ref Range Status  Specimen Description   Final    SYNOVIAL Performed at [place] [address], [place], [place] [address]    Special Requests   Final    NONE Performed at [facility], [place] [address], [place], [place] [address]    Gram Stain   Final     RARE WBC PRESENT,BOTH PMN AND MONONUCLEAR NO ORGANISMS SEEN    Culture   Final    RARE CANDIDA ALBICANS NO ANAEROBES ISOLATED CRITICAL RESULT CALLED TO, READ BACK BY AND VERIFIED WITH: DR. Mardelle Matte VIA EPIC [phone] FCP Sent to [place] for further susceptibility testing. Performed at [facility], [place] [address], [place], [place] [address]    Report Status PENDING  Incomplete  Fungus Stain     Status: None   Collection Time: 09/22/20 10:05 PM   Specimen: Synovial, Right Elbow; Tissue  Result Value Ref Range Status   FUNGUS STAIN DUPLICATE  Final    Comment: (NOTE) Duplicate procedure ordered.      Refer to [id number] for results. Performed At: [facility] [place], [place] [id number] Rush Farmer MD DG:[id number]    Fungal Source SYNOVIAL  Final    Comment: Performed at [facility], [place] [address], [place], [place] [address]  Acid Fast Smear (AFB)     Status: None   Collection Time: 09/22/20 10:05 PM   Specimen: Synovial, Right Elbow; Tissue  Result Value Ref Range Status   AFB Specimen Processing Comment  Final    Comment: Tissue Grinding and Digestion/Decontamination   Acid Fast Smear Negative  Final    Comment: (NOTE) Performed At: [facility] [address] [place], [place] [id number] Rush Farmer MD NA:[id number]    Source (AFB) HAND  Final    Comment: Performed at [facility], [place] [address], [place], [place] [address]  Fungus Culture With Stain     Status: Abnormal (Preliminary result)   Collection Time: 09/22/20 10:05 PM   Specimen: Hand, Right; Body Fluid  Result Value Ref Range Status   Fungus Stain Final report (A)  Final    Comment: (NOTE) POSITIVE RESULT REPORTED TO CAMELIA M AT 01:43PM ON 09/27/20 BY SM. FAX #: [phone] Performed At: [facility] [address] [place], [place] [id number] Rush Farmer MD [id number]    Fungus (Mycology) Culture PENDING   Incomplete   Fungal Source SYNOVIAL  Final    Comment: Performed at [facility], [place] [address], [place], [place] [address]  Aerobic/Anaerobic Culture w Gram Stain (surgical/deep wound)     Status: None   Collection Time: 09/22/20 10:05 PM   Specimen: Hand, Right; Body Fluid  Result Value Ref Range Status   Specimen Description   Final    HAND Performed at [facility], [place] [address] [place], [place] [address]    Special Requests   Final    NONE Performed at [facility], [place] [address], [place], [place] [address]    Gram Stain   Final    RARE WBC PRESENT, PREDOMINANTLY PMN NO ORGANISMS SEEN    Culture   Final    RARE CANDIDA ALBICANS NO ANAEROBES ISOLATED Performed at [facility], [name] [address] [place], [place] [address]    Report Status 09/29/2020 FINAL  Final  Acid Fast Smear (AFB)     Status: None   Collection Time: 09/22/20 10:05 PM   Specimen: Hand, Right; Body Fluid  Result Value Ref Range Status   AFB Specimen Processing Comment  Final    Comment: Tissue Grinding and Digestion/Decontamination   Acid Fast Smear Negative  Final    Comment: (NOTE) Performed At: [facility] [place], [place] [id number] Rush Farmer MD BS:[id number]    Source (AFB) SYNOVIAL  Final    Comment: Performed at [facility], [place] [address] [place], [place] [address]  Fungus Culture Result     Status: None   Collection Time: 09/22/20 10:05 PM  Result Value Ref Range Status   Result 1 Comment  Final    Comment: (NOTE) KOH/Calcofluor preparation:  no fungus observed. Performed At: [facility] [place], [place] [id number] Rush Farmer MD LE:[id number]   Fungus Culture Result     Status: Abnormal   Collection Time: 09/22/20 10:05 PM  Result Value Ref Range Status   Result 1 Comment (A)  Final    Comment: (NOTE) Pseudohyphae and yeasts  observed Performed At: [facility] [place], [place] [id number] Rush Farmer MD BW:[id number]      Labs: BNP (last 3 results) Recent Labs    08/25/20 1435 09/22/20 1426  BNP 494* 177.9*   Basic Metabolic Panel: Recent Labs  Lab 09/24/20 0618 09/25/20 0545 09/26/20 0457 09/27/20 0503 09/28/20 0528 09/29/20 0914  NA 137 139 138 139 140 137  K 4.2 4.0 3.5 4.3 4.5 4.4  CL 102 105 102 105 109 99  CO2 _0 GLUCOSE 223* 170* 156* 108* 136* 214*  BUN 41* 42* 53* 48* 44* 45*  CREATININE 1.47* 1.31* 1.47* 1.35* 1.22* 1.83*  CALCIUM 8.4* 8.5* 8.5* 8.7* 8.9 9.2  MG 2.2 2.2 2.2 2.4 2.4  --    Liver Function Tests: Recent Labs  Lab 09/28/20 0528  AST 32  ALT 34  ALKPHOS 71  BILITOT 0.5  PROT 5.6*  ALBUMIN 2.4*   No results for input(s): LIPASE, AMYLASE in the last 168 hours. No results for input(s): AMMONIA in the last 168 hours. CBC: Recent Labs  Lab 09/24/20 0618 09/25/20 0545 09/26/20 0457 09/27/20 0503 09/28/20 0528  WBC 17.2* 18.6* 15.8* 16.7* 17.6*  NEUTROABS 14.7* 15.0* 11.9* 12.3* 13.2*  HGB 10.7* 11.3* 11.0* 11.3* 10.2*  HCT 34.5* 36.1 35.8* 36.6 33.7*  MCV 106.5* 105.9* 106.9* 108.0* 108.4*  PLT 224 246 238 266 252   Cardiac Enzymes: Recent Labs  Lab 09/24/20 0618 09/26/20 0457  CKTOTAL 649* 232   BNP: Invalid input(s): POCBNP CBG: Recent Labs  Lab 09/29/20 0801 09/29/20 1142 09/29/20 1624 09/29/20 2108 09/30/20 0731  GLUCAP 109* 185* 209* 199* 83   D-Dimer No results for input(s): DDIMER in the last 72 hours. Hgb A1c No results for input(s): HGBA1C in the last 72 hours. Lipid Profile No results for input(s): CHOL, HDL, LDLCALC, TRIG, CHOLHDL, LDLDIRECT in the last 72 hours. Thyroid function studies No results for input(s): TSH, T4TOTAL, T3FREE, THYROIDAB in the last 72 hours.  Invalid input(s): FREET3 Anemia work up No results for input(s): VITAMINB12, FOLATE, FERRITIN, TIBC, IRON,  RETICCTPCT in the last 72 hours. Urinalysis    Component Value Date/Time   COLORURINE YELLOW 02/23/2020 1300   APPEARANCEUR CLEAR 02/23/2020 1300   LABSPEC 1.014 02/23/2020 1300   PHURINE 6.0 02/23/2020 1300   GLUCOSEU NEGATIVE 02/23/2020 1300   HGBUR NEGATIVE 02/23/2020 1300   BILIRUBINUR NEGATIVE 02/23/2020 1300   KETONESUR NEGATIVE 02/23/2020 1300   PROTEINUR NEGATIVE 02/23/2020 1300  UROBILINOGEN 0.2 05/17/2014 1113   NITRITE NEGATIVE 02/23/2020 1300   LEUKOCYTESUR NEGATIVE 02/23/2020 1300   Sepsis Labs Invalid input(s): PROCALCITONIN,  WBC,  LACTICIDVEN Microbiology Recent Results (from the past 240 hour(s))  Blood Culture (routine x 2)     Status: None   Collection Time: 09/22/20  1:03 PM   Specimen: BLOOD  Result Value Ref Range Status   Specimen Description   Final    BLOOD RIGHT ANTECUBITAL Performed at [place] [address], [place], [place] [address]    Special Requests   Final    BOTTLES DRAWN AEROBIC AND ANAEROBIC Blood Culture adequate volume Performed at [place] [address], [place], [place] [address]    Culture   Final    NO GROWTH 5 DAYS Performed at [facility], [place] [address], [place], [place] [address]    Report Status 09/27/2020 FINAL  Final  Resp Panel by RT-PCR (Flu A&B, Covid) Nasopharyngeal Swab     Status: None   Collection Time: 09/22/20  1:04 PM   Specimen: Nasopharyngeal Swab; Nasopharyngeal(NP) swabs in vial transport medium  Result Value Ref Range Status   SARS Coronavirus 2 by RT PCR NEGATIVE NEGATIVE Final    Comment: (NOTE) SARS-CoV-2 target nucleic acids are NOT DETECTED.  The SARS-CoV-2 RNA is generally detectable in upper respiratory specimens during the acute phase of infection. The lowest concentration of SARS-CoV-2 viral copies this assay can detect is 138 copies/mL. A negative result does not preclude SARS-Cov-2 infection and should not be used as the sole basis for  treatment or other patient management decisions. A negative result may occur with  improper specimen collection/handling, submission of specimen other than nasopharyngeal swab, presence of viral mutation(s) within the areas targeted by this assay, and inadequate number of viral copies(<138 copies/mL). A negative result must be combined with clinical observations, patient history, and epidemiological information. The expected result is Negative.  Fact Sheet for Patients:  EntrepreneurPulse.com.au  Fact Sheet for Healthcare Providers:  IncredibleEmployment.be  This test is no t yet approved or cleared by the [place] FDA and  has been authorized for detection and/or diagnosis of SARS-CoV-2 by FDA under an Emergency Use Authorization (EUA). This EUA will remain  in effect (meaning this test can be used) for the duration of the COVID-19 declaration under Section 564(b)(1) of the Act, 21 U.S.C.section 360bbb-3(b)(1), unless the authorization is terminated  or revoked sooner.       Influenza A by PCR NEGATIVE NEGATIVE Final   Influenza B by PCR NEGATIVE NEGATIVE Final    Comment: (NOTE) The Xpert Xpress SARS-CoV-2/FLU/RSV plus assay is intended as an aid in the diagnosis of influenza from Nasopharyngeal swab specimens and should not be used as a sole basis for treatment. Nasal washings and aspirates are unacceptable for Xpert Xpress SARS-CoV-2/FLU/RSV testing.  Fact Sheet for Patients: EntrepreneurPulse.com.au  Fact Sheet for Healthcare Providers: IncredibleEmployment.be  This test is not yet approved or cleared by the [place] FDA and has been authorized for detection and/or diagnosis of SARS-CoV-2 by FDA under an Emergency Use Authorization (EUA). This EUA will remain in effect (meaning this test can be used) for the duration of the COVID-19 declaration under Section 564(b)(1) of the Act, 21  U.S.C. section 360bbb-3(b)(1), unless the authorization is terminated or revoked.  Performed at [facility], [place] [address], [place], [place] [address]   Blood Culture (routine x 2)     Status: None  Collection Time: 09/22/20  1:08 PM   Specimen: BLOOD  Result Value Ref Range Status   Specimen Description   Final    BLOOD LEFT ANTECUBITAL Performed at [place] [address] [place], [place] [address]    Special Requests   Final    BOTTLES DRAWN AEROBIC AND ANAEROBIC Blood Culture adequate volume Performed at [facility] [address], [place], [place] [address]    Culture   Final    NO GROWTH 5 DAYS Performed at [facility], [place] [address], [place], [place] [address]    Report Status 09/27/2020 FINAL  Final  Fungus Culture With Stain     Status: None (Preliminary result)   Collection Time: 09/22/20 10:05 PM   Specimen: Synovial, Right Elbow; Tissue  Result Value Ref Range Status   Fungus Stain Final report  Final    Comment: (NOTE) Performed At: [facility] [address] [place], [place] [id number] Rush Farmer MD [id number]    Fungus (Mycology) Culture PENDING  Incomplete   Fungal Source HAND  Final    Comment: Performed at [facility], [place] [address] [place], [place] [address]  Aerobic/Anaerobic Culture w Gram Stain (surgical/deep wound)     Status: None (Preliminary result)   Collection Time: 09/22/20 10:05 PM   Specimen: Synovial, Right Elbow; Tissue  Result Value Ref Range Status   Specimen Description   Final    SYNOVIAL Performed at [facility] [address], [place], [place] [address]    Special Requests   Final    NONE Performed at [facility], [place] [address], [place], [place] [address]    Gram Stain   Final    RARE WBC PRESENT,BOTH PMN AND MONONUCLEAR NO ORGANISMS SEEN    Culture   Final    RARE  CANDIDA ALBICANS NO ANAEROBES ISOLATED CRITICAL RESULT CALLED TO, READ BACK BY AND VERIFIED WITH: DR. Mardelle Matte VIA EPIC [phone] FCP Sent to [place] for further susceptibility testing. Performed at [facility], [place] [address] [place],  [address]    Report Status PENDING  Incomplete  Fungus Stain     Status: None   Collection Time: 09/22/20 10:05 PM   Specimen: Synovial, Right Elbow; Tissue  Result Value Ref Range Status   FUNGUS STAIN DUPLICATE  Final    Comment: (NOTE) Duplicate procedure ordered.      Refer to [id number] for results. Performed At: [facility] [place], [place] [id number] Rush Farmer MD IT:[id number]    Fungal Source SYNOVIAL  Final    Comment: Performed at [facility], [place] [address], [place], [place] [address]  Acid Fast Smear (AFB)     Status: None   Collection Time: 09/22/20 10:05 PM   Specimen: Synovial, Right Elbow; Tissue  Result Value Ref Range Status   AFB Specimen Processing Comment  Final    Comment: Tissue Grinding and Digestion/Decontamination   Acid Fast Smear Negative  Final    Comment: (NOTE) Performed At: [facility] [address] [place], [place] [id number] Rush Farmer MD [id number]    Source (AFB) HAND  Final    Comment: Performed at [place], [place] [address], [place],  [address]  Fungus Culture With Stain     Status: Abnormal (Preliminary result)   Collection Time: 09/22/20 10:05 PM   Specimen: Hand, Right; Body Fluid  Result Value Ref Range Status  Fungus Stain Final report (A)  Final    Comment: (NOTE) POSITIVE RESULT REPORTED TO CAMELIA M AT 01:43PM ON 09/27/20 BY SM. FAX #: [phone] Performed At: [facility] [address] [place], [place] [id number] Rush Farmer MD [id number]    Fungus (Mycology) Culture PENDING  Incomplete   Fungal Source SYNOVIAL  Final    Comment: Performed at [facility], [place] [address], [place], [place] [address]  Aerobic/Anaerobic Culture w Gram Stain (surgical/deep wound)     Status: None   Collection Time: 09/22/20 10:05 PM   Specimen: Hand, Right; Body Fluid  Result Value Ref Range Status   Specimen Description   Final    HAND Performed at [facility], [place] [address], [place], [place] [address]    Special Requests   Final    NONE Performed at [facility], [place] [address], [place], [place] [address]    Gram Stain   Final    RARE WBC PRESENT, PREDOMINANTLY PMN NO ORGANISMS SEEN    Culture   Final    RARE CANDIDA ALBICANS NO ANAEROBES ISOLATED Performed at [facility], [place] [address], [place], [place] [address]    Report Status 09/29/2020 FINAL  Final  Acid Fast Smear (AFB)     Status: None   Collection Time: 09/22/20 10:05 PM   Specimen: Hand, Right; Body Fluid  Result Value Ref Range Status   AFB Specimen Processing Comment  Final    Comment: Tissue Grinding and Digestion/Decontamination   Acid Fast Smear Negative  Final    Comment: (NOTE) Performed At: [facility] [place], [place] [id number] Rush Farmer MD [id number]    Source (AFB) SYNOVIAL  Final    Comment: Performed at [facility], [place] [address] [place],  [address]  Fungus Culture Result     Status: None   Collection Time: 09/22/20 10:05 PM  Result Value Ref Range Status   Result 1 Comment  Final    Comment: (NOTE) KOH/Calcofluor preparation:  no fungus observed. Performed At: [facility] [place], [place] [id number] Rush Farmer MD [id number]   Fungus Culture Result     Status: Abnormal   Collection Time: 09/22/20 10:05 PM  Result Value Ref Range Status   Result 1 Comment (A)  Final    Comment: (NOTE) Pseudohyphae and yeasts observed Performed At: [facility] [place], [place] [id number] Rush Farmer MD DX:[id number]      Time coordinating discharge: Over 30 minutes  SIGNED:   Charlynne Cousins, MD  [facility] 09/30/2020, 8:41 AM Pager   If 7PM-7AM, please contact night-coverage [email or web address] Password [email or web address]

## 2020-09-30 NOTE — Progress Notes (Signed)
Attempted to provide the report to nurse Maudie Mercury but she was tied up. After extensive hold time Writer Left contact number with Network engineer for Maudie Mercury the receiving RN to return call.

## 2020-10-01 LAB — MISC LABCORP TEST (SEND OUT): Labcorp test code: 183119

## 2020-10-04 ENCOUNTER — Emergency Department (HOSPITAL_COMMUNITY): Payer: Medicare PPO

## 2020-10-04 ENCOUNTER — Encounter (HOSPITAL_COMMUNITY): Payer: Self-pay

## 2020-10-04 ENCOUNTER — Inpatient Hospital Stay (HOSPITAL_COMMUNITY)
Admission: EM | Admit: 2020-10-04 | Discharge: 2020-10-11 | DRG: 682 | Disposition: A | Discharge status: Skilled Nursing Facility | Payer: Medicare PPO | Source: Skilled Nursing Facility | Attending: Family Medicine | Admitting: Family Medicine

## 2020-10-04 ENCOUNTER — Observation Stay (HOSPITAL_COMMUNITY): Payer: Medicare PPO

## 2020-10-04 DIAGNOSIS — X58XXXA Exposure to other specified factors, initial encounter: Secondary | ICD-10-CM | POA: Diagnosis present

## 2020-10-04 DIAGNOSIS — I5042 Chronic combined systolic (congestive) and diastolic (congestive) heart failure: Secondary | ICD-10-CM | POA: Diagnosis present

## 2020-10-04 DIAGNOSIS — J441 Chronic obstructive pulmonary disease with (acute) exacerbation: Secondary | ICD-10-CM | POA: Diagnosis present

## 2020-10-04 DIAGNOSIS — Z7401 Bed confinement status: Secondary | ICD-10-CM | POA: Diagnosis not present

## 2020-10-04 DIAGNOSIS — M069 Rheumatoid arthritis, unspecified: Secondary | ICD-10-CM | POA: Diagnosis not present

## 2020-10-04 DIAGNOSIS — E1151 Type 2 diabetes mellitus with diabetic peripheral angiopathy without gangrene: Secondary | ICD-10-CM | POA: Diagnosis present

## 2020-10-04 DIAGNOSIS — Z9842 Cataract extraction status, left eye: Secondary | ICD-10-CM

## 2020-10-04 DIAGNOSIS — Z20822 Contact with and (suspected) exposure to covid-19: Secondary | ICD-10-CM | POA: Diagnosis not present

## 2020-10-04 DIAGNOSIS — R531 Weakness: Secondary | ICD-10-CM | POA: Diagnosis not present

## 2020-10-04 DIAGNOSIS — D539 Nutritional anemia, unspecified: Secondary | ICD-10-CM | POA: Diagnosis present

## 2020-10-04 DIAGNOSIS — I5043 Acute on chronic combined systolic (congestive) and diastolic (congestive) heart failure: Secondary | ICD-10-CM | POA: Diagnosis not present

## 2020-10-04 DIAGNOSIS — E119 Type 2 diabetes mellitus without complications: Secondary | ICD-10-CM

## 2020-10-04 DIAGNOSIS — E7800 Pure hypercholesterolemia, unspecified: Secondary | ICD-10-CM | POA: Diagnosis present

## 2020-10-04 DIAGNOSIS — E870 Hyperosmolality and hypernatremia: Secondary | ICD-10-CM | POA: Diagnosis not present

## 2020-10-04 DIAGNOSIS — Z888 Allergy status to other drugs, medicaments and biological substances status: Secondary | ICD-10-CM

## 2020-10-04 DIAGNOSIS — T380X5A Adverse effect of glucocorticoids and synthetic analogues, initial encounter: Secondary | ICD-10-CM | POA: Diagnosis present

## 2020-10-04 DIAGNOSIS — Z91013 Allergy to seafood: Secondary | ICD-10-CM

## 2020-10-04 DIAGNOSIS — Z9889 Other specified postprocedural states: Secondary | ICD-10-CM | POA: Diagnosis not present

## 2020-10-04 DIAGNOSIS — Z885 Allergy status to narcotic agent status: Secondary | ICD-10-CM

## 2020-10-04 DIAGNOSIS — Z9049 Acquired absence of other specified parts of digestive tract: Secondary | ICD-10-CM

## 2020-10-04 DIAGNOSIS — I2510 Atherosclerotic heart disease of native coronary artery without angina pectoris: Secondary | ICD-10-CM | POA: Diagnosis present

## 2020-10-04 DIAGNOSIS — Z87442 Personal history of urinary calculi: Secondary | ICD-10-CM | POA: Diagnosis not present

## 2020-10-04 DIAGNOSIS — Z96611 Presence of right artificial shoulder joint: Secondary | ICD-10-CM | POA: Diagnosis present

## 2020-10-04 DIAGNOSIS — Z7901 Long term (current) use of anticoagulants: Secondary | ICD-10-CM | POA: Diagnosis not present

## 2020-10-04 DIAGNOSIS — M810 Age-related osteoporosis without current pathological fracture: Secondary | ICD-10-CM | POA: Diagnosis present

## 2020-10-04 DIAGNOSIS — I959 Hypotension, unspecified: Secondary | ICD-10-CM | POA: Diagnosis present

## 2020-10-04 DIAGNOSIS — N19 Unspecified kidney failure: Secondary | ICD-10-CM | POA: Diagnosis not present

## 2020-10-04 DIAGNOSIS — Z8249 Family history of ischemic heart disease and other diseases of the circulatory system: Secondary | ICD-10-CM

## 2020-10-04 DIAGNOSIS — E1122 Type 2 diabetes mellitus with diabetic chronic kidney disease: Secondary | ICD-10-CM | POA: Diagnosis present

## 2020-10-04 DIAGNOSIS — N1832 Chronic kidney disease, stage 3b: Secondary | ICD-10-CM | POA: Diagnosis present

## 2020-10-04 DIAGNOSIS — Z86718 Personal history of other venous thrombosis and embolism: Secondary | ICD-10-CM

## 2020-10-04 DIAGNOSIS — N189 Chronic kidney disease, unspecified: Secondary | ICD-10-CM | POA: Diagnosis present

## 2020-10-04 DIAGNOSIS — D631 Anemia in chronic kidney disease: Secondary | ICD-10-CM | POA: Diagnosis present

## 2020-10-04 DIAGNOSIS — N261 Atrophy of kidney (terminal): Secondary | ICD-10-CM | POA: Diagnosis not present

## 2020-10-04 DIAGNOSIS — J4540 Moderate persistent asthma, uncomplicated: Secondary | ICD-10-CM | POA: Diagnosis present

## 2020-10-04 DIAGNOSIS — Z86711 Personal history of pulmonary embolism: Secondary | ICD-10-CM

## 2020-10-04 DIAGNOSIS — M797 Fibromyalgia: Secondary | ICD-10-CM | POA: Diagnosis present

## 2020-10-04 DIAGNOSIS — R262 Difficulty in walking, not elsewhere classified: Secondary | ICD-10-CM | POA: Diagnosis not present

## 2020-10-04 DIAGNOSIS — Z801 Family history of malignant neoplasm of trachea, bronchus and lung: Secondary | ICD-10-CM

## 2020-10-04 DIAGNOSIS — I10 Essential (primary) hypertension: Secondary | ICD-10-CM | POA: Diagnosis not present

## 2020-10-04 DIAGNOSIS — Z9841 Cataract extraction status, right eye: Secondary | ICD-10-CM

## 2020-10-04 DIAGNOSIS — R2681 Unsteadiness on feet: Secondary | ICD-10-CM | POA: Diagnosis not present

## 2020-10-04 DIAGNOSIS — Z981 Arthrodesis status: Secondary | ICD-10-CM

## 2020-10-04 DIAGNOSIS — Z66 Do not resuscitate: Secondary | ICD-10-CM | POA: Diagnosis present

## 2020-10-04 DIAGNOSIS — I517 Cardiomegaly: Secondary | ICD-10-CM | POA: Diagnosis not present

## 2020-10-04 DIAGNOSIS — Z833 Family history of diabetes mellitus: Secondary | ICD-10-CM

## 2020-10-04 DIAGNOSIS — I82409 Acute embolism and thrombosis of unspecified deep veins of unspecified lower extremity: Secondary | ICD-10-CM | POA: Diagnosis present

## 2020-10-04 DIAGNOSIS — K219 Gastro-esophageal reflux disease without esophagitis: Secondary | ICD-10-CM | POA: Diagnosis not present

## 2020-10-04 DIAGNOSIS — R739 Hyperglycemia, unspecified: Secondary | ICD-10-CM | POA: Diagnosis not present

## 2020-10-04 DIAGNOSIS — F32A Depression, unspecified: Secondary | ICD-10-CM | POA: Diagnosis not present

## 2020-10-04 DIAGNOSIS — Z8261 Family history of arthritis: Secondary | ICD-10-CM

## 2020-10-04 DIAGNOSIS — I130 Hypertensive heart and chronic kidney disease with heart failure and stage 1 through stage 4 chronic kidney disease, or unspecified chronic kidney disease: Secondary | ICD-10-CM | POA: Diagnosis present

## 2020-10-04 DIAGNOSIS — I493 Ventricular premature depolarization: Secondary | ICD-10-CM | POA: Diagnosis present

## 2020-10-04 DIAGNOSIS — Z853 Personal history of malignant neoplasm of breast: Secondary | ICD-10-CM

## 2020-10-04 DIAGNOSIS — E860 Dehydration: Secondary | ICD-10-CM | POA: Diagnosis present

## 2020-10-04 DIAGNOSIS — E039 Hypothyroidism, unspecified: Secondary | ICD-10-CM | POA: Diagnosis present

## 2020-10-04 DIAGNOSIS — Z9102 Food additives allergy status: Secondary | ICD-10-CM

## 2020-10-04 DIAGNOSIS — K5730 Diverticulosis of large intestine without perforation or abscess without bleeding: Secondary | ICD-10-CM | POA: Diagnosis not present

## 2020-10-04 DIAGNOSIS — Z823 Family history of stroke: Secondary | ICD-10-CM

## 2020-10-04 DIAGNOSIS — N170 Acute kidney failure with tubular necrosis: Secondary | ICD-10-CM | POA: Diagnosis present

## 2020-10-04 DIAGNOSIS — M6281 Muscle weakness (generalized): Secondary | ICD-10-CM | POA: Diagnosis not present

## 2020-10-04 DIAGNOSIS — Z961 Presence of intraocular lens: Secondary | ICD-10-CM | POA: Diagnosis present

## 2020-10-04 DIAGNOSIS — I5022 Chronic systolic (congestive) heart failure: Secondary | ICD-10-CM | POA: Diagnosis present

## 2020-10-04 DIAGNOSIS — E875 Hyperkalemia: Secondary | ICD-10-CM | POA: Diagnosis present

## 2020-10-04 DIAGNOSIS — R0602 Shortness of breath: Secondary | ICD-10-CM | POA: Diagnosis not present

## 2020-10-04 DIAGNOSIS — Z7952 Long term (current) use of systemic steroids: Secondary | ICD-10-CM

## 2020-10-04 DIAGNOSIS — E1169 Type 2 diabetes mellitus with other specified complication: Secondary | ICD-10-CM | POA: Diagnosis not present

## 2020-10-04 DIAGNOSIS — E785 Hyperlipidemia, unspecified: Secondary | ICD-10-CM | POA: Diagnosis not present

## 2020-10-04 DIAGNOSIS — K429 Umbilical hernia without obstruction or gangrene: Secondary | ICD-10-CM | POA: Diagnosis not present

## 2020-10-04 DIAGNOSIS — R0600 Dyspnea, unspecified: Secondary | ICD-10-CM | POA: Diagnosis not present

## 2020-10-04 DIAGNOSIS — R0902 Hypoxemia: Secondary | ICD-10-CM | POA: Diagnosis not present

## 2020-10-04 DIAGNOSIS — I429 Cardiomyopathy, unspecified: Secondary | ICD-10-CM | POA: Diagnosis present

## 2020-10-04 DIAGNOSIS — M0600 Rheumatoid arthritis without rheumatoid factor, unspecified site: Secondary | ICD-10-CM | POA: Diagnosis present

## 2020-10-04 DIAGNOSIS — N179 Acute kidney failure, unspecified: Secondary | ICD-10-CM | POA: Diagnosis present

## 2020-10-04 DIAGNOSIS — Z86010 Personal history of colonic polyps: Secondary | ICD-10-CM

## 2020-10-04 DIAGNOSIS — I420 Dilated cardiomyopathy: Secondary | ICD-10-CM | POA: Diagnosis not present

## 2020-10-04 DIAGNOSIS — Z90710 Acquired absence of both cervix and uterus: Secondary | ICD-10-CM

## 2020-10-04 DIAGNOSIS — M71121 Other infective bursitis, right elbow: Secondary | ICD-10-CM | POA: Diagnosis not present

## 2020-10-04 DIAGNOSIS — Z79899 Other long term (current) drug therapy: Secondary | ICD-10-CM

## 2020-10-04 DIAGNOSIS — Z800 Family history of malignant neoplasm of digestive organs: Secondary | ICD-10-CM

## 2020-10-04 DIAGNOSIS — T502X5A Adverse effect of carbonic-anhydrase inhibitors, benzothiadiazides and other diuretics, initial encounter: Secondary | ICD-10-CM | POA: Diagnosis present

## 2020-10-04 LAB — COMPREHENSIVE METABOLIC PANEL
ALT: 132 U/L — ABNORMAL HIGH (ref 0–44)
AST: 98 U/L — ABNORMAL HIGH (ref 15–41)
Albumin: 2.6 g/dL — ABNORMAL LOW (ref 3.5–5.0)
Alkaline Phosphatase: 268 U/L — ABNORMAL HIGH (ref 38–126)
Anion gap: 13 (ref 5–15)
BUN: 127 mg/dL — ABNORMAL HIGH (ref 8–23)
CO2: 21 mmol/L — ABNORMAL LOW (ref 22–32)
Calcium: 7.7 mg/dL — ABNORMAL LOW (ref 8.9–10.3)
Chloride: 97 mmol/L — ABNORMAL LOW (ref 98–111)
Creatinine, Ser: 4.07 mg/dL — ABNORMAL HIGH (ref 0.44–1.00)
GFR, Estimated: 11 mL/min — ABNORMAL LOW (ref >60)
Glucose, Bld: 345 mg/dL — ABNORMAL HIGH (ref 70–99)
Potassium: 6 mmol/L — ABNORMAL HIGH (ref 3.5–5.1)
Sodium: 131 mmol/L — ABNORMAL LOW (ref 135–145)
Total Bilirubin: 0.5 mg/dL (ref 0.3–1.2)
Total Protein: 6.6 g/dL (ref 6.5–8.1)

## 2020-10-04 LAB — CBC WITH DIFFERENTIAL/PLATELET
Abs Immature Granulocytes: 0.63 10*3/uL — ABNORMAL HIGH (ref 0.00–0.07)
Basophils Absolute: 0.1 10*3/uL (ref 0.0–0.1)
Basophils Relative: 0 %
Eosinophils Absolute: 0 10*3/uL (ref 0.0–0.5)
Eosinophils Relative: 0 %
HCT: 35.8 % — ABNORMAL LOW (ref 36.0–46.0)
Hemoglobin: 11.3 g/dL — ABNORMAL LOW (ref 12.0–15.0)
Immature Granulocytes: 4 %
Lymphocytes Relative: 3 %
Lymphs Abs: 0.5 10*3/uL — ABNORMAL LOW (ref 0.7–4.0)
MCH: 32.8 pg (ref 26.0–34.0)
MCHC: 31.6 g/dL (ref 30.0–36.0)
MCV: 104.1 fL — ABNORMAL HIGH (ref 80.0–100.0)
Monocytes Absolute: 1 10*3/uL (ref 0.1–1.0)
Monocytes Relative: 6 %
Neutro Abs: 13.6 10*3/uL — ABNORMAL HIGH (ref 1.7–7.7)
Neutrophils Relative %: 87 %
Platelets: 372 10*3/uL (ref 150–400)
RBC: 3.44 MIL/uL — ABNORMAL LOW (ref 3.87–5.11)
RDW: 16.3 % — ABNORMAL HIGH (ref 11.5–15.5)
WBC: 15.8 10*3/uL — ABNORMAL HIGH (ref 4.0–10.5)
nRBC: 0 % (ref 0.0–0.2)

## 2020-10-04 LAB — PHOSPHORUS: Phosphorus: 6.5 mg/dL — ABNORMAL HIGH (ref 2.5–4.6)

## 2020-10-04 LAB — PROTIME-INR
INR: 1.6 — ABNORMAL HIGH (ref 0.8–1.2)
Prothrombin Time: 19.1 seconds — ABNORMAL HIGH (ref 11.4–15.2)

## 2020-10-04 LAB — MAGNESIUM: Magnesium: 3.1 mg/dL — ABNORMAL HIGH (ref 1.7–2.4)

## 2020-10-04 LAB — BRAIN NATRIURETIC PEPTIDE: B Natriuretic Peptide: 1083.7 pg/mL — ABNORMAL HIGH (ref 0.0–100.0)

## 2020-10-04 MED ORDER — COLCHICINE 0.6 MG PO TABS
0.6000 mg | ORAL_TABLET | ORAL | Status: DC
  Administered 2020-10-05: 0.6 mg via ORAL
  Filled 2020-10-04: qty 1

## 2020-10-04 MED ORDER — FOLIC ACID 1 MG PO TABS
1.0000 mg | ORAL_TABLET | Freq: Every day | ORAL | Status: DC
  Administered 2020-10-05 – 2020-10-11 (×7): 1 mg via ORAL
  Filled 2020-10-04 (×7): qty 1

## 2020-10-04 MED ORDER — HYDROCORTISONE NA SUCCINATE PF 100 MG IJ SOLR
50.0000 mg | Freq: Three times a day (TID) | INTRAMUSCULAR | Status: DC
  Administered 2020-10-05 (×2): 50 mg via INTRAVENOUS
  Filled 2020-10-04 (×2): qty 2

## 2020-10-04 MED ORDER — UMECLIDINIUM BROMIDE 62.5 MCG/INH IN AEPB
1.0000 | INHALATION_SPRAY | Freq: Every day | RESPIRATORY_TRACT | Status: DC
  Administered 2020-10-06 – 2020-10-11 (×6): 1 via RESPIRATORY_TRACT
  Filled 2020-10-04: qty 7

## 2020-10-04 MED ORDER — LORATADINE 10 MG PO TABS
10.0000 mg | ORAL_TABLET | Freq: Every day | ORAL | Status: DC
  Administered 2020-10-05 – 2020-10-09 (×4): 10 mg via ORAL
  Filled 2020-10-04 (×8): qty 1

## 2020-10-04 MED ORDER — MONTELUKAST SODIUM 10 MG PO TABS
10.0000 mg | ORAL_TABLET | Freq: Every day | ORAL | Status: DC
  Administered 2020-10-05 – 2020-10-11 (×7): 10 mg via ORAL
  Filled 2020-10-04 (×8): qty 1

## 2020-10-04 MED ORDER — SODIUM CHLORIDE 0.9 % IV BOLUS
1000.0000 mL | Freq: Once | INTRAVENOUS | Status: AC
  Administered 2020-10-04: 1000 mL via INTRAVENOUS

## 2020-10-04 MED ORDER — SODIUM ZIRCONIUM CYCLOSILICATE 5 G PO PACK
5.0000 g | PACK | Freq: Once | ORAL | Status: AC
  Administered 2020-10-04: 5 g via ORAL
  Filled 2020-10-04: qty 1

## 2020-10-04 MED ORDER — PANTOPRAZOLE SODIUM 40 MG PO TBEC
40.0000 mg | DELAYED_RELEASE_TABLET | Freq: Every day | ORAL | Status: DC
  Administered 2020-10-05 – 2020-10-11 (×7): 40 mg via ORAL
  Filled 2020-10-04 (×7): qty 1

## 2020-10-04 MED ORDER — BISOPROLOL FUMARATE 5 MG PO TABS
2.5000 mg | ORAL_TABLET | Freq: Every day | ORAL | Status: DC
  Administered 2020-10-05 – 2020-10-11 (×7): 2.5 mg via ORAL
  Filled 2020-10-04: qty 0.5
  Filled 2020-10-04 (×6): qty 1

## 2020-10-04 MED ORDER — ALBUTEROL SULFATE HFA 108 (90 BASE) MCG/ACT IN AERS: 2.0000 | INHALATION_SPRAY | RESPIRATORY_TRACT | Status: DC | PRN

## 2020-10-04 MED ORDER — ACETAMINOPHEN 325 MG PO TABS
650.0000 mg | ORAL_TABLET | Freq: Four times a day (QID) | ORAL | Status: DC | PRN
  Administered 2020-10-05 – 2020-10-07 (×3): 650 mg via ORAL
  Filled 2020-10-04 (×3): qty 2

## 2020-10-04 MED ORDER — ARFORMOTEROL TARTRATE 15 MCG/2ML IN NEBU
15.0000 ug | INHALATION_SOLUTION | Freq: Two times a day (BID) | RESPIRATORY_TRACT | Status: DC
  Administered 2020-10-05 – 2020-10-11 (×13): 15 ug via RESPIRATORY_TRACT
  Filled 2020-10-04 (×14): qty 2

## 2020-10-04 MED ORDER — CYPROHEPTADINE HCL 4 MG PO TABS
8.0000 mg | ORAL_TABLET | Freq: Every evening | ORAL | Status: DC
  Administered 2020-10-05 – 2020-10-10 (×7): 8 mg via ORAL
  Filled 2020-10-04 (×10): qty 2

## 2020-10-04 MED ORDER — MIRTAZAPINE 30 MG PO TBDP
30.0000 mg | ORAL_TABLET | Freq: Every day | ORAL | Status: DC
  Administered 2020-10-05 – 2020-10-10 (×7): 30 mg via ORAL
  Filled 2020-10-04 (×9): qty 1

## 2020-10-04 MED ORDER — LEFLUNOMIDE 20 MG PO TABS: 20.0000 mg | ORAL_TABLET | Freq: Every day | ORAL | Status: DC

## 2020-10-04 MED ORDER — ACETAMINOPHEN 650 MG RE SUPP: 650.0000 mg | Freq: Four times a day (QID) | RECTAL | Status: DC | PRN

## 2020-10-04 MED ORDER — ALLOPURINOL 100 MG PO TABS
100.0000 mg | ORAL_TABLET | Freq: Every day | ORAL | Status: DC
  Administered 2020-10-05 – 2020-10-11 (×7): 100 mg via ORAL
  Filled 2020-10-04 (×7): qty 1

## 2020-10-04 MED ORDER — SODIUM CHLORIDE 0.9 % IV SOLN: INTRAVENOUS | Status: AC

## 2020-10-04 MED ORDER — GABAPENTIN 300 MG PO CAPS
300.0000 mg | ORAL_CAPSULE | Freq: Every day | ORAL | Status: DC
  Administered 2020-10-05 – 2020-10-10 (×7): 300 mg via ORAL
  Filled 2020-10-04 (×7): qty 1

## 2020-10-04 MED ORDER — FLUCONAZOLE 100 MG PO TABS
200.0000 mg | ORAL_TABLET | Freq: Every day | ORAL | Status: DC
  Administered 2020-10-05 – 2020-10-11 (×7): 200 mg via ORAL
  Filled 2020-10-04 (×2): qty 2
  Filled 2020-10-04: qty 1
  Filled 2020-10-04 (×5): qty 2

## 2020-10-04 MED ORDER — SODIUM CHLORIDE 0.9 % IV SOLN: Freq: Once | INTRAVENOUS | Status: AC

## 2020-10-04 MED ORDER — MAGNESIUM OXIDE -MG SUPPLEMENT 400 (240 MG) MG PO TABS
400.0000 mg | ORAL_TABLET | Freq: Every day | ORAL | Status: DC
  Administered 2020-10-05 – 2020-10-11 (×7): 400 mg via ORAL
  Filled 2020-10-04 (×7): qty 1

## 2020-10-04 MED ORDER — ATORVASTATIN CALCIUM 10 MG PO TABS
10.0000 mg | ORAL_TABLET | Freq: Every day | ORAL | Status: DC
  Administered 2020-10-05: 10 mg via ORAL
  Filled 2020-10-04: qty 1

## 2020-10-04 NOTE — ED Triage Notes (Signed)
Pt presents to the ED via EMS from her SNF for "abnormal labs" which indicated "acute renal failure," per EMS. Pt reports increased SOB, with a hx of asthma. She denies any accompanying complaints. She reports right elbow pain secondary to "surgery a couple weeks ago." She denies any fever, chills, nausea, vomiting, abd pain. She additionally reports decreased urine output.

## 2020-10-04 NOTE — ED Provider Notes (Signed)
Needs admission for AKI, no systemic symptoms. Getting liter of fluids, soft BP. Physical Exam  BP (!) 88/51   Pulse (!) 52   Temp 97.6 F (36.4 C) (Oral)   Resp 17   SpO2 92%   Physical Exam  ED Course/Procedures     Procedures  MDM         Charlesetta Shanks, MD 10/22/20 1350

## 2020-10-04 NOTE — H&P (Signed)
History and Physical    Ebony Scott QHU:[id number] DOB: [birth year] DOA: 10/04/2020  PCP: [name], MD  Patient coming from: Skilled nursing facility.  Chief Complaint: Abnormal labs.  HPI: Ebony Scott is a 70 y.o. female with history of diabetes mellitus type 2, CHF, rheumatoid arthritis who was admitted recently for right elbow and hand infection with Candida infection status postdebridement had been to the rehab where patient had blood work done which showed acute renal failure and was referred to the ER.  Patient denies any nausea vomiting diarrhea chest pain or shortness of breath.  During recent admission patient was found to have systolic heart failure and was started on Entresto beta-blockers and Lasix dose was increased.  At discharge on August 11th 2022 her creatinine was 1.8.  ED Course: In the ER patient is found to be mildly hypotensive with systolic blood pressure in the 90s was given fluid bolus following which blood pressure at the time of my exam is around 10 systolic.  Creatinine has worsened to 4.07 with potassium of 6 EKG showing sinus bradycardia.  Patient was given Lokelma and IV fluids and admitted for acute renal failure.  CT renal study does not show any obstruction.  On exam patient appears dehydrated.  Patient's BUN is 127.  Phosphorus 6.5.  Admission 3.1 AST and ALT are elevated at 98 and 132.  Total bilirubin 0.5.  Patient's WBC count is 15.8 hemoglobin 11.3.  BNP 1000 with chest x-ray showing nothing acute.  COVID test is negative.  Review of Systems: As per HPI, rest all negative.   Past Medical History:  Diagnosis Date   Abscess of dorsum of right hand 09/22/2020   Acute deep vein thrombosis (DVT) of right lower extremity ([place]) 12/13/2017   Allergic rhinitis    Anemia    Angio-edema    Anxiety    pt denies   Arthritis    Phreesia 10/21/2019   Asthma    Phreesia 10/21/2019   Breast cancer ([place]) 1998   in remission   Cataract    REMOVED    Clostridium difficile colitis 01/14/2018   Clostridium difficile diarrhea 46/56/8127   Complication of anesthesia    "had hard time waking up from it several times" (02/20/2012)   Depression    "some; don't take anything for it" (02/20/2012)   Diverticulosis    DVT (deep venous thrombosis) ([place])    Fibromyalgia 11/2011   GERD (gastroesophageal reflux disease)    Graves disease    Headache(784.0)    "related to allergies; more at different times during the year" (02/20/2012)   Hemorrhoids    Hiatal hernia    back and neck   Hx of adenomatous colonic polyps 04/12/2016   Hypercholesteremia    good cholesterol is high   Hypothyroidism    IBS (irritable bowel syndrome)    Moderate persistent asthma    -FeV1 72% 2011, -IgE 102 2011, CT sinus Neg 2011   Osteoporosis    on reclast yearly   Peripheral vascular disease ([place]) 2019   DVTs   Pneumonia 04/2011; ~ 11/2011   "double; single" (02/20/2012)   Septic olecranon bursitis of right elbow 09/22/2020   Seronegative rheumatoid arthritis ([place])    Dr. Lahoma Rocker   SIRS (systemic inflammatory response syndrome) ([place]) 02/10/2018   Tracheobronchomalacia     Past Surgical History:  Procedure Laterality Date   ABDOMINAL HYSTERECTOMY N/A    Phreesia 10/21/2019   ANTERIOR AND POSTERIOR REPAIR  1990's  APPENDECTOMY     BREAST LUMPECTOMY  1998   left   BREAST SURGERY N/A    Phreesia 10/21/2019   BRONCHIAL WASHINGS  04/05/2020   Procedure: BRONCHIAL WASHINGS;  Surgeon: Freddi Starr, MD;  Location: [facility];  Service: Pulmonary;;   CARPOMETACARPEL ([place]) FUSION OF THUMB WITH AUTOGRAFT FROM RADIUS  ~ 2009   "both thumbs" (02/20/2012)   CATARACT EXTRACTION W/ INTRAOCULAR LENS  IMPLANT, BILATERAL  2012   CERVICAL DISCECTOMY  10/2001   C5-C6   CERVICAL FUSION  2003   C3-C4   CHOLECYSTECTOMY     COLONOSCOPY     DEBRIDEMENT TENNIS ELBOW  ?1970's   right   ESOPHAGOGASTRODUODENOSCOPY     EYE SURGERY N/A    Phreesia 10/21/2019    HYSTERECTOMY     I & D EXTREMITY Right 09/22/2020   Procedure: IRRIGATION AND DEBRIDEMENT RIGHT HAND AND ELBOW;  Surgeon: Marchia Bond, MD;  Location: [facility];  Service: Orthopedics;  Laterality: Right;   KNEE ARTHROPLASTY  ?1990's   "?right; w/cartilage repair" (02/20/2012)   NASAL SEPTUM SURGERY  1980's   POSTERIOR CERVICAL FUSION/FORAMINOTOMY  2004   "failed initial fusion; rewired  anterior neck" (02/20/2012)   REVERSE SHOULDER ARTHROPLASTY Right 02/16/2020   Procedure: REVERSE SHOULDER ARTHROPLASTY;  Surgeon: Marchia Bond, MD;  Location: [facility];  Service: Orthopedics;  Laterality: Right;   SPINE SURGERY N/A    Phreesia 10/21/2019   TONSILLECTOMY  ~ 1953   VESICOVAGINAL FISTULA CLOSURE W/ TAH  1988   VIDEO BRONCHOSCOPY Bilateral 08/23/2016   Procedure: VIDEO BRONCHOSCOPY WITH FLUORO;  Surgeon: Javier Glazier, MD;  [facility];  Service: Cardiopulmonary;  Laterality: Bilateral;   VIDEO BRONCHOSCOPY N/A 04/05/2020   Procedure: VIDEO BRONCHOSCOPY WITHOUT FLUORO;  Surgeon: Freddi Starr, MD;  [facility];  Service: Pulmonary;  Laterality: N/A;     reports that she has never smoked. She has been exposed to tobacco smoke. She has never used smokeless tobacco. She reports that she does not currently use alcohol. She reports that she does not use drugs.  Allergies  Allergen Reactions   Baclofen Other (See Comments)    Altered mental status requiring a 3-day hospital stay- patient became unresponsive   Dust Mite Extract Shortness Of Breath and Other (See Comments)    "sneezing" (02/20/2012)   Molds & Smuts Shortness Of Breath   Morphine And Related Hives and Itching   Other Shortness Of Breath and Other (See Comments)    Grass and weeds "sneezing; filled sinuses" (02/20/2012)   Penicillins Rash and Other (See Comments)    "welts" (02/20/2012) Tolerated Ancef 02/16/20.    Rofecoxib Swelling and Other (See Comments)    Vioxx- feet swelling    Shellfish Allergy  Anaphylaxis, Shortness Of Breath, Itching and Rash   Shrimp Flavor Anaphylaxis and Other (See Comments)    ALL SHELLFISH, also   Tetracycline Hcl Nausea And Vomiting   Xolair [Omalizumab] Other (See Comments)    Caused Blood clot   Zoledronic Acid Other (See Comments)    Fever, Put in hospital, dr said it was a reaction from a reaction  Other reaction(s): Unknown   Dilaudid [Hydromorphone Hcl] Itching   Levofloxacin Other (See Comments)    GI upset   Oxycodone Hcl Nausea And Vomiting   Paroxetine Nausea And Vomiting and Other (See Comments)    Paxil    Celecoxib Swelling and Other (See Comments)    Feet swelling   Diltiazem Swelling   Diltiazem Hcl  Swelling   Hydrocodone-Acetaminophen Nausea And Vomiting and Other (See Comments)    "Allergic," per [facility]   Lactose Intolerance (Gi) Other (See Comments)    Bloating and gas   Morphine Sulfate Other (See Comments)    "Gets loopy"   Oxycodone-Acetaminophen Other (See Comments)    Reaction??   Strawberry Flavor Other (See Comments)    Reaction?   Tree Extract Other (See Comments)    "tested and told I was allergic to it; never experienced a reaction to it" (02/20/2012)   Penicillin G Procaine Rash and Other (See Comments)    Welts, also    Family History  Problem Relation Age of Onset   Allergies Mother    Heart disease Mother    Arthritis Mother    Lung cancer Mother    Diabetes Mother    Allergies Father    Heart disease Father    Arthritis Father    Stroke Father    Colon cancer Other        Maternal half aunt/Maternal half uncle   Colitis Daughter    Diabetes Maternal Grandfather     Prior to Admission medications   Medication Sig Start Date End Date Taking? Authorizing Provider  fluticasone (FLONASE) 50 MCG/ACT nasal spray USE 2 SPRAYS IN EACH NOSTRIL DAILY Patient taking differently: Place 2 sprays into both nostrils daily. 09/02/20  Yes Freddi Starr, MD  acetaminophen (TYLENOL) 650 MG CR tablet Take 1,300 mg  by mouth 2 (two) times daily.    [provider]  albuterol (VENTOLIN HFA) 108 (90 Base) MCG/ACT inhaler INHALE 2 PUFFS BY MOUTH INTO THE LUNGS EVERY 4 HOURS AS NEEDED FOR WHEEZING OR SHORTNESS OF BREATH Patient taking differently: Inhale 2 puffs into the lungs every 4 (four) hours as needed for wheezing or shortness of breath. 07/04/20   Freddi Starr, MD  allopurinol (ZYLOPRIM) 100 MG tablet Take 100 mg by mouth daily. 11/19/19   [provider]  atorvastatin (LIPITOR) 10 MG tablet TAKE 1 TABLET BY MOUTH DAILY Patient taking differently: Take 10 mg by mouth daily. 03/16/20   [name], MD  azelastine (ASTELIN) 0.1 % nasal spray USE 2 SPRAYS IN EACH NOSTRIL DAILY AS DIRECTED 01/02/19   Kozlow, Donnamarie Poag, MD  bisoprolol (ZEBETA) 5 MG tablet Take 0.5 tablets (2.5 mg total) by mouth daily. 09/30/20   Charlynne Cousins, MD  Blood Glucose Monitoring Suppl (BLOOD GLUCOSE SYSTEM PAK) KIT Please dispense per patient and insurance preference. Use as directed to monitor FSBS 4x . Dx: E11.65 09/08/20   [name], MD  cetirizine (ZYRTEC) 10 MG tablet Take 10 mg by mouth daily.    [provider]  cholecalciferol (VITAMIN D) 25 MCG (1000 UT) tablet Take 1,000 Units by mouth daily.     [provider]  Continuous Blood Gluc Receiver (FREESTYLE LIBRE 14 DAY READER) DEVI Apply 1 each topically daily as needed. USE AS DIRECTED TO MONITOR BLOOD GLUCOSE LEVELS. DX: E11.9. 09/16/20   [name], MD  Continuous Blood Gluc Sensor (FREESTYLE LIBRE 14 DAY SENSOR) MISC Apply 1 each topically every 14 (fourteen) days. USE AS DIRECTED TO MONITOR BLOOD GLUCOSE LEVELS. DX: E11.9. 09/16/20   [name], MD  cyproheptadine (PERIACTIN) 4 MG tablet TAKE 2 TABLETS BY MOUTH EVERY EVENING Patient taking differently: Take 8 mg by mouth every evening. 02/02/20   [name], MD  DEXILANT 60 MG capsule TAKE 1 CAPSULE BY MOUTH [facility] MORNING 02/02/20  [name],  MD  diclofenac Sodium (VOLTAREN) 1 % GEL Apply 2 g topically 4 (four) times daily as needed (joint pain). 05/26/20   [name], MD  EPINEPHrine (EPIPEN 2-PAK) 0.3 mg/0.3 mL IJ SOAJ injection Inject 0.3 mLs (0.3 mg total) into the muscle Once PRN. 01/23/19   [name], MD  famotidine (PEPCID) 20 MG tablet TAKE 1 TABLET BY MOUTH TWICE A DAY Patient taking differently: Take 20 mg by mouth at bedtime. 02/01/20   Kozlow, Donnamarie Poag, MD  fluconazole (DIFLUCAN) 200 MG tablet Take 1 tablet (200 mg total) by mouth daily for 21 days. 09/30/20 10/21/20  Charlynne Cousins, MD  folic acid (FOLVITE) 1 MG tablet Take 1 mg by mouth daily.    [provider]  furosemide (LASIX) 40 MG tablet Take 1 tablet (40 mg total) by mouth daily. 80 mg in the AM and 40 mg in the PM 10/02/20   Charlynne Cousins, MD  gabapentin (NEURONTIN) 600 MG tablet TAKE 1 TABLET BY MOUTH DAILY Patient taking differently: Take 600 mg by mouth at bedtime. 02/03/20   [name], MD  Glucose Blood (BLOOD GLUCOSE TEST STRIPS) STRP Please dispense per patient and insurance preference. Use as directed to monitor FSBS 4x . Dx: E11.65 09/08/20   [name], MD  guaiFENesin (MUCINEX) 600 MG 12 hr tablet Take 1 tablet (600 mg total) by mouth 2 (two) times daily as needed for cough or to loosen phlegm. 05/09/20   [name], MD  leflunomide (ARAVA) 20 MG tablet Take 20 mg by mouth daily.    [provider]  magnesium oxide (MAG-OX) 400 MG tablet Take 400 mg by mouth daily.    [provider]  meclizine (ANTIVERT) 25 MG tablet Take 25 mg by mouth 3 (three) times daily as needed for dizziness.    [provider]  mirtazapine (REMERON SOL-TAB) 30 MG disintegrating tablet DISSOLVE 1 TABLET BY MOUTH EVERY NIGHT AT BEDTIME 05/26/20   [name], MD  MITIGARE 0.6 MG CAPS Take 0.6 mg by mouth every other day. 01/16/20   [provider]  montelukast (SINGULAIR) 10 MG tablet TAKE 1  TABLET BY MOUTH DAILY 09/05/20   Kozlow, Donnamarie Poag, MD  predniSONE (DELTASONE) 1 MG tablet Take 12 tablets (12 mg total) by mouth daily with breakfast. 09/30/20   Charlynne Cousins, MD  Propylene Glycol (SYSTANE COMPLETE) 0.6 % SOLN Place 1 drop into both eyes 2 (two) times daily.    [provider]  sacubitril-valsartan (ENTRESTO) 49-51 MG Take 1 tablet by mouth 2 (two) times daily. 09/30/20   Charlynne Cousins, MD  tiotropium (SPIRIVA) 18 MCG inhalation capsule Place 18 mcg into inhaler and inhale daily.    [provider]  Tiotropium Bromide-Olodaterol (STIOLTO RESPIMAT) 2.5-2.5 MCG/ACT AERS Inhale 2 puffs into the lungs daily. 08/05/20   Freddi Starr, MD  traMADol (ULTRAM) 50 MG tablet Take 50 mg by mouth at bedtime.    [provider]  XARELTO 20 MG TABS tablet TAKE 1 TABLET BY MOUTH DAILY WITH SUPPER Patient taking differently: Take 20 mg by mouth every evening. 02/02/20   [name], MD  metolazone (ZAROXOLYN) 5 MG tablet Take 1 tablet (5 mg total) by mouth daily. Patient not taking: No sig reported 08/25/20 09/23/20  [name], MD  potassium chloride SA (KLOR-CON) 20 MEQ tablet Take 1 tablet (20 mEq total) by mouth daily. Patient not taking: Reported on  09/23/2020 08/30/20 09/23/20  [name], MD    Physical Exam: Constitutional: Moderately built and nourished. Vitals:   10/04/20 1715 10/04/20 1730 10/04/20 1745 10/04/20 1845  BP: 90/61 (!) 102/56 (!) 99/59 97/73  Pulse: (!) 57 (!) 53 (!) 53 (!) 55  Resp: $Remo'19 17 15 20  'tJffr$ Temp:      TempSrc:      SpO2: 96% 98% 96% 97%   Eyes: Anicteric no pallor. ENMT: No discharge from the ears eyes nose and mouth Neck: No mass felt.  No JVD appreciated. Respiratory: No rhonchi or crepitations. Cardiovascular: S1-S2 heard. Abdomen: Soft nontender bowel sound present. Musculoskeletal: Right upper extremity is in dressing. Skin: Right upper extremity is in dressing. Neurologic: Alert awake oriented to  time place and person.  Moves all extremities. Psychiatric: Appears normal.  Normal affect.   Labs on Admission: I have personally reviewed following labs and imaging studies  CBC: Recent Labs  Lab 09/28/20 0528 10/04/20 1455  WBC 17.6* 15.8*  NEUTROABS 13.2* 13.6*  HGB 10.2* 11.3*  HCT 33.7* 35.8*  MCV 108.4* 104.1*  PLT 252 387   Basic Metabolic Panel: Recent Labs  Lab 09/28/20 0528 09/29/20 0914 10/04/20 1455  NA 140 137 131*  K 4.5 4.4 6.0*  CL 109 99 97*  CO2 22 25 21*  GLUCOSE 136* 214* 345*  BUN 44* 45* 127*  CREATININE 1.22* 1.83* 4.07*  CALCIUM 8.9 9.2 7.7*  MG 2.4  --  3.1*  PHOS  --   --  6.5*   GFR: Estimated Creatinine Clearance: 10.7 mL/min (A) (by C-G formula based on SCr of 4.07 mg/dL (H)). Liver Function Tests: Recent Labs  Lab 09/28/20 0528 10/04/20 1455  AST 32 98*  ALT 34 132*  ALKPHOS 71 268*  BILITOT 0.5 0.5  PROT 5.6* 6.6  ALBUMIN 2.4* 2.6*   No results for input(s): LIPASE, AMYLASE in the last 168 hours. No results for input(s): AMMONIA in the last 168 hours. Coagulation Profile: No results for input(s): INR, PROTIME in the last 168 hours. Cardiac Enzymes: No results for input(s): CKTOTAL, CKMB, CKMBINDEX, TROPONINI in the last 168 hours. BNP (last 3 results) No results for input(s): PROBNP in the last 8760 hours. HbA1C: No results for input(s): HGBA1C in the last 72 hours. CBG: Recent Labs  Lab 09/29/20 1142 09/29/20 1624 09/29/20 2108 09/30/20 0731 09/30/20 1228  GLUCAP 185* 209* 199* 83 198*   Lipid Profile: No results for input(s): CHOL, HDL, LDLCALC, TRIG, CHOLHDL, LDLDIRECT in the last 72 hours. Thyroid Function Tests: No results for input(s): TSH, T4TOTAL, FREET4, T3FREE, THYROIDAB in the last 72 hours. Anemia Panel: No results for input(s): VITAMINB12, FOLATE, FERRITIN, TIBC, IRON, RETICCTPCT in the last 72 hours. Urine analysis:    Component Value Date/Time   COLORURINE YELLOW 02/23/2020 1300    APPEARANCEUR CLEAR 02/23/2020 1300   LABSPEC 1.014 02/23/2020 1300   PHURINE 6.0 02/23/2020 1300   GLUCOSEU NEGATIVE 02/23/2020 1300   HGBUR NEGATIVE 02/23/2020 1300   BILIRUBINUR NEGATIVE 02/23/2020 1300   KETONESUR NEGATIVE 02/23/2020 1300   PROTEINUR NEGATIVE 02/23/2020 1300   UROBILINOGEN 0.2 05/17/2014 1113   NITRITE NEGATIVE 02/23/2020 1300   LEUKOCYTESUR NEGATIVE 02/23/2020 1300   Sepsis Labs: $RemoveBefo'@[id number]'IucASiQrmCY$ (procalcitonin:4,lacticidven:4) ) Recent Results (from the past 240 hour(s))  Resp Panel by RT-PCR (Flu A&B, Covid) Nasopharyngeal Swab     Status: None   Collection Time: 09/30/20 10:20 AM   Specimen: Nasopharyngeal Swab; Nasopharyngeal(NP) swabs in vial transport medium  Result Value Ref Range  Status   SARS Coronavirus 2 by RT PCR NEGATIVE NEGATIVE Final    Comment: (NOTE) SARS-CoV-2 target nucleic acids are NOT DETECTED.  The SARS-CoV-2 RNA is generally detectable in upper respiratory specimens during the acute phase of infection. The lowest concentration of SARS-CoV-2 viral copies this assay can detect is 138 copies/mL. A negative result does not preclude SARS-Cov-2 infection and should not be used as the sole basis for treatment or other patient management decisions. A negative result may occur with  improper specimen collection/handling, submission of specimen other than nasopharyngeal swab, presence of viral mutation(s) within the areas targeted by this assay, and inadequate number of viral copies(<138 copies/mL). A negative result must be combined with clinical observations, patient history, and epidemiological information. The expected result is Negative.  Fact Sheet for Patients:  EntrepreneurPulse.com.au  Fact Sheet for Healthcare Providers:  IncredibleEmployment.be  This test is no t yet approved or cleared by the [place] FDA and  has been authorized for detection and/or diagnosis of SARS-CoV-2 by FDA under an  Emergency Use Authorization (EUA). This EUA will remain  in effect (meaning this test can be used) for the duration of the COVID-19 declaration under Section 564(b)(1) of the Act, 21 U.S.C.section 360bbb-3(b)(1), unless the authorization is terminated  or revoked sooner.       Influenza A by PCR NEGATIVE NEGATIVE Final   Influenza B by PCR NEGATIVE NEGATIVE Final    Comment: (NOTE) The Xpert Xpress SARS-CoV-2/FLU/RSV plus assay is intended as an aid in the diagnosis of influenza from Nasopharyngeal swab specimens and should not be used as a sole basis for treatment. Nasal washings and aspirates are unacceptable for Xpert Xpress SARS-CoV-2/FLU/RSV testing.  Fact Sheet for Patients: EntrepreneurPulse.com.au  Fact Sheet for Healthcare Providers: IncredibleEmployment.be  This test is not yet approved or cleared by the [place] FDA and has been authorized for detection and/or diagnosis of SARS-CoV-2 by FDA under an Emergency Use Authorization (EUA). This EUA will remain in effect (meaning this test can be used) for the duration of the COVID-19 declaration under Section 564(b)(1) of the Act, 21 U.S.C. section 360bbb-3(b)(1), unless the authorization is terminated or revoked.  Performed at [facility], [place] [address], [name], [place] [address]      Radiological Exams on Admission: DG Chest Port 1 View  Result Date: 10/04/2020 CLINICAL DATA:  Possible fluid overload with increased shortness of breath, initial encounter EXAM: PORTABLE CHEST 1 VIEW COMPARISON:  09/22/2020 FINDINGS: Postsurgical changes are again noted. Cardiac shadow is enlarged but stable. Lungs are clear. No significant vascular congestion is noted. No bony abnormality is noted. IMPRESSION: No findings to suggest fluid overload. Electronically Signed   By: Inez Catalina M.D.   On: 10/04/2020 17:18   CT RENAL STONE STUDY  Result Date: 10/04/2020 CLINICAL  DATA:  Urinary tract stone, symptomatic/complicated. Elevated creatinine EXAM: CT ABDOMEN AND PELVIS WITHOUT CONTRAST TECHNIQUE: Multidetector CT imaging of the abdomen and pelvis was performed following the standard protocol without IV contrast. COMPARISON:  None. FINDINGS: Lower chest: Coronary artery calcifications.  Bibasilar scarring. Hepatobiliary: No focal liver abnormality. Status post cholecystectomy. No biliary dilatation. Pancreas: No focal lesion. Normal pancreatic contour. No surrounding inflammatory changes. No main pancreatic ductal dilatation. Spleen: Normal in size without focal abnormality. Adrenals/Urinary Tract: No adrenal nodule bilaterally. Bilateral renal cortical scarring and atrophy. No nephrolithiasis and no hydronephrosis. No definite contour-deforming renal mass. No ureterolithiasis or hydroureter. The urinary bladder is unremarkable. Stomach/Bowel: Stomach is within normal limits. No  evidence of bowel wall thickening or dilatation. Couple scattered colonic diverticula. Appendix appears normal. Vascular/Lymphatic: No abdominal aorta or iliac aneurysm. Mild atherosclerotic plaque of the aorta and its branches. No abdominal, pelvic, or inguinal lymphadenopathy. Reproductive: Status post hysterectomy. No adnexal masses. Other: No intraperitoneal free fluid. No intraperitoneal free gas. No organized fluid collection. Musculoskeletal: Tiny fat containing umbilical hernia. Asymmetrically atrophic right iliopsoas muscle. No suspicious lytic or blastic osseous lesions. No acute displaced fracture. Grade 1 anterolisthesis of L4 on L5 with associated degenerative changes. IMPRESSION: 1. No acute intra-abdominal or intrapelvic abnormality with limited evaluation on this noncontrast study. 2. Couple scattered colon diverticula with no acute diverticulitis. 3. Atrophic scarred kidneys. 4.  Aortic Atherosclerosis (ICD10-I70.0). Electronically Signed   By: Iven Finn M.D.   On: 10/04/2020 19:58     EKG: Independently reviewed.  Sinus bradycardia heart rate around 55 bpm.  QTC is 432 ms.  Assessment/Plan Principal Problem:   ARF (acute renal failure) (HCC) Active Problems:   Chronic combined systolic and diastolic CHF (congestive heart failure) (HCC)   Chronic kidney disease, stage 3b (HCC)   DVT (deep venous thrombosis) (HCC)   Rheumatoid arthritis (HCC)   DMII (diabetes mellitus, type 2) (HCC)    Acute on chronic kidney disease stage III baseline creatinine is usually around 1.6 presently it is 4.07 increased within 5 days of discharge.  Patient is making urine.  CT renal study does not show any obstruction.  We will continue hydration closely monitor respiratory status due to CHF.  Patient was given [facility] for the hyperkalemia.  Will repeat stat metabolic panel to further assess patient's potassium and also creatinine.  Closely follow intake output.  Hold patient's Lasix Entresto.  Renal failure likely could be from hypotension possible poor oral intake and patient is also on medications.  Urine studies are pending. Uncontrolled diabetes mellitus type 2 -last hemoglobin A1c was 8.1 TWELVE days ago.  I am repeating metabolic panel stat.  May need to check if there is any worsening anion gap metabolic acidosis.  Based on which we will see if we need to keep patient on insulin infusion or start patient on long-acting insulin.  Patient is presently on sliding scale coverage. History of rheumatoid arthritis on prednisone and Arava.  We will keep patient on stress dose steroids. Chronic systolic and diastolic heart failure last EF measured was 35 to 40% on September 16, 2020 about 2 weeks ago.  Holding Entresto Lasix and beta-blockers due to hypotension and renal failure.  Closely monitor respiratory status as patient is receiving fluids. History of DVT and PE on Xarelto.  Based on the next metabolic panel we will see if patient can continue Xarelto occupational Heparin based on the GFR.  I  discussed with pharmacy. Recent admission for septic olecranon bursitis and abscess of the hand status postdebridement.  Candida albicans grew for which infectious diseases advised keeping patient on fluconazole which will be continued.  End date for fluconazole is October 20, 2020.  Since patient is acute renal failure with markedly elevated creatinine from baseline along with markedly elevated BUN will need close monitoring for any further worsening and inpatient status.   DVT prophylaxis: Presently on Xarelto or we will need to keep on heparin if creatinine does not improve. Code Status: DNR. Family Communication: Patient's daughter. Disposition Plan: To be determined. Consults called: None. Admission status: Inpatient.   Rise Patience MD [facility] Pager [phone].  If 7PM-7AM, please contact night-coverage [email or web address] Password [email or web address]  10/04/2020, 9:07 PM

## 2020-10-04 NOTE — ED Notes (Signed)
This nurse called the lab regarding pt's blood work which was drawn and sent at 1524. Initially the lab stated they "don't have any blood work for her." However they have verified they have received the pt's blood work and are running it now.

## 2020-10-04 NOTE — ED Notes (Signed)
Pt purewick not in place, pt linen changed and pt cleaned up

## 2020-10-04 NOTE — ED Provider Notes (Signed)
[facility] DEPT Provider Note   CSN: [id number] Arrival date & time: 10/04/20  1414     History Chief Complaint  Patient presents with   Acute Renal Failure    Abnl labs    Ebony Scott is a 70 y.o. female.  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  Patient was discharged on 8-12 after admission for septic olecranon bursitis of the right elbow.  Patient was discharged to a rehab facility.  She reports that she had some regular labs today and was told that her kidney function is getting worse.  Patient has been eating and drinking well.  She denies any nausea, vomiting, diarrhea.    Of note, she was recently diagnosed with worsening CHF and started on Entresto.  Her EF is 35%.  Patient denies any pitting edema, orthopnea, PND.  Past Medical History:  Diagnosis Date   Abscess of dorsum of right hand 09/22/2020   Acute deep vein thrombosis (DVT) of right lower extremity ([place]) 12/13/2017   Allergic rhinitis    Anemia    Angio-edema    Anxiety    pt denies   Arthritis    Phreesia 10/21/2019   Asthma    Phreesia 10/21/2019   Breast cancer ([place]) 1998   in remission   Cataract    REMOVED   Clostridium difficile colitis 01/14/2018   Clostridium difficile diarrhea 38/11/1749   Complication of anesthesia    "had hard time waking up from it several times" (02/20/2012)   Depression    "some; don't take anything for it" (02/20/2012)   Diverticulosis    DVT (deep venous thrombosis) ([place])    Fibromyalgia 11/2011   GERD (gastroesophageal reflux disease)    Graves disease    Headache(784.0)    "related to allergies; more at different times during the year" (02/20/2012)   Hemorrhoids    Hiatal hernia    back and neck   Hx of adenomatous colonic polyps 04/12/2016   Hypercholesteremia    good cholesterol is high   Hypothyroidism    IBS  (irritable bowel syndrome)    Moderate persistent asthma    -FeV1 72% 2011, -IgE 102 2011, CT sinus Neg 2011   Osteoporosis    on reclast yearly   Peripheral vascular disease ([place]) 2019   DVTs   Pneumonia 04/2011; ~ 11/2011   "double; single" (02/20/2012)   Septic olecranon bursitis of right elbow 09/22/2020   Seronegative rheumatoid arthritis ([place])    Dr. Lahoma Rocker   SIRS (systemic inflammatory response syndrome) ([place]) 02/10/2018   Tracheobronchomalacia     Patient Active Problem List   Diagnosis Date Noted   Physical deconditioning 09/25/2020   History of DVT (deep vein thrombosis) 09/24/2020   Osteomyelitis of second toe of right foot ([place]) 09/23/2020   DMII (diabetes mellitus, type 2) ([place]) 09/23/2020   Cellulitis of right hand 09/22/2020   Septic olecranon bursitis of right elbow 09/22/2020   Abscess of dorsum of right hand 02/58/5277   Metabolic encephalopathy 82/42/3536   Acute encephalopathy    AMS (altered mental status) 02/23/2020   S/P reverse total shoulder arthroplasty, right    Hypokalemia    DOE (dyspnea on exertion) 02/18/2020   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   S/P reverse total shoulder arthroplasty, left 02/16/2020   Preoperative clearance 01/26/2020   Closed displaced fracture of metatarsal bone of  right foot 01/20/2019   History of pulmonary embolus (PE) 12/19/2018   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   Rheumatoid arthritis ([place])    Bilateral impacted cerumen 08/18/2018   Fungal otitis externa 08/18/2018   Asthmatic bronchitis with exacerbation 07/29/2018   Pulmonary embolism ([place]) 07/29/2018   Cellulitis of forearm    Fever of unknown origin (FUO) 02/10/2018   Abdominal pain 02/10/2018   Fracture of base of fifth metatarsal bone with routine healing, left    Closed fracture of fifth metatarsal bone of right foot, initial encounter 01/15/2018   DVT (deep venous thrombosis) ([place]) 01/14/2018   Hypothyroidism 01/14/2018   Depression  01/14/2018   Debility    Malnutrition of moderate degree 12/17/2017   Knee pain    History of breast cancer    Chronic pain syndrome    Fibromyalgia    Graves disease    Sepsis due to undetermined organism ([place]) 12/13/2017   Rhabdomyolysis 12/13/2017   Dysphonia 08/10/2017   Pulmonary nodules    Chronic kidney disease, stage 3b ([place]) 04/15/2016   Hx of adenomatous colonic polyps 04/12/2016   Cough 02/09/2016   Opacity of lung on imaging study 11/03/2015   Severe persistent asthma 02/08/2015   Facial pain syndrome 02/08/2015   Insomnia 02/08/2015   Other allergic rhinitis 02/08/2015   LPRD (laryngopharyngeal reflux disease) 02/08/2015   Chronic combined systolic and diastolic CHF (congestive heart failure) ([place]) 02/08/2015   Benign paroxysmal positional vertigo 12/03/2012   Dizziness and giddiness 10/29/2012   Disequilibrium 10/29/2012   Pseudogout 02/24/2012   Pneumonia 05/31/2011   Irritable bowel syndrome 01/02/2010   Hyperlipidemia 12/23/2009   Hyperthyroidism 11/12/2006   Seasonal and perennial allergic rhinitis 11/12/2006   GERD 11/12/2006   NECK PAIN, CHRONIC 11/12/2006   OSTEOPOROSIS 11/12/2006   BREAST CANCER, HX OF 11/12/2006    Past Surgical History:  Procedure Laterality Date   ABDOMINAL HYSTERECTOMY N/A    Phreesia 10/21/2019   ANTERIOR AND POSTERIOR REPAIR  1990's   APPENDECTOMY     BREAST LUMPECTOMY  1998   left   BREAST SURGERY N/A    Phreesia 10/21/2019   BRONCHIAL WASHINGS  04/05/2020   Procedure: BRONCHIAL WASHINGS;  Surgeon: Freddi Starr, MD;  Location: [facility];  Service: Pulmonary;;   CARPOMETACARPEL ([place]) FUSION OF THUMB WITH AUTOGRAFT FROM RADIUS  ~ 2009   "both thumbs" (02/20/2012)   CATARACT EXTRACTION W/ INTRAOCULAR LENS  IMPLANT, BILATERAL  2012   CERVICAL DISCECTOMY  10/2001   C5-C6   CERVICAL FUSION  2003   C3-C4   CHOLECYSTECTOMY     COLONOSCOPY     DEBRIDEMENT TENNIS ELBOW  ?1970's   right   ESOPHAGOGASTRODUODENOSCOPY      EYE SURGERY N/A    Phreesia 10/21/2019   HYSTERECTOMY     I & D EXTREMITY Right 09/22/2020   Procedure: IRRIGATION AND DEBRIDEMENT RIGHT HAND AND ELBOW;  Surgeon: Marchia Bond, MD;  Location: [facility];  Service: Orthopedics;  Laterality: Right;   KNEE ARTHROPLASTY  ?1990's   "?right; w/cartilage repair" (02/20/2012)   NASAL SEPTUM SURGERY  1980's   POSTERIOR CERVICAL FUSION/FORAMINOTOMY  2004   "failed initial fusion; rewired  anterior neck" (02/20/2012)   REVERSE SHOULDER ARTHROPLASTY Right 02/16/2020   Procedure: REVERSE SHOULDER ARTHROPLASTY;  Surgeon: Marchia Bond, MD;  Location: [facility];  Service: Orthopedics;  Laterality: Right;   SPINE SURGERY N/A    Phreesia 10/21/2019   TONSILLECTOMY  ~ [place]  VIDEO BRONCHOSCOPY Bilateral 08/23/2016   Procedure: VIDEO BRONCHOSCOPY WITH FLUORO;  Surgeon: Javier Glazier, MD;  Location: [facility];  Service: Cardiopulmonary;  Laterality: Bilateral;   VIDEO BRONCHOSCOPY N/A 04/05/2020   Procedure: VIDEO BRONCHOSCOPY WITHOUT FLUORO;  Surgeon: Freddi Starr, MD;  Location: [facility];  Service: Pulmonary;  Laterality: N/A;     OB History   No obstetric history on file.     Family History  Problem Relation Age of Onset   Allergies Mother    Heart disease Mother    Arthritis Mother    Lung cancer Mother    Diabetes Mother    Allergies Father    Heart disease Father    Arthritis Father    Stroke Father    Colon cancer Other        Maternal half aunt/Maternal half uncle   Colitis Daughter    Diabetes Maternal Grandfather     Social History   Tobacco Use   Smoking status: Never    Passive exposure: Yes   Smokeless tobacco: Never   Tobacco comments:    Parents  Vaping Use   Vaping Use: Never used  Substance Use Topics   Alcohol use: Not Currently    Alcohol/week: 0.0 standard drinks   Drug use: No    Home Medications Prior to Admission medications   Medication Sig Start  Date End Date Taking? Authorizing Provider  acetaminophen (TYLENOL) 650 MG CR tablet Take 1,300 mg by mouth 2 (two) times daily.    [provider]  albuterol (VENTOLIN HFA) 108 (90 Base) MCG/ACT inhaler INHALE 2 PUFFS BY MOUTH INTO THE LUNGS EVERY 4 HOURS AS NEEDED FOR WHEEZING OR SHORTNESS OF BREATH Patient taking differently: Inhale 2 puffs into the lungs every 4 (four) hours as needed for wheezing or shortness of breath. 07/04/20   Freddi Starr, MD  allopurinol (ZYLOPRIM) 100 MG tablet Take 100 mg by mouth daily. 11/19/19   [provider]  atorvastatin (LIPITOR) 10 MG tablet TAKE 1 TABLET BY MOUTH DAILY Patient taking differently: Take 10 mg by mouth daily. 03/16/20   [name], MD  azelastine (ASTELIN) 0.1 % nasal spray USE 2 SPRAYS IN EACH NOSTRIL DAILY AS DIRECTED 01/02/19   Kozlow, Donnamarie Poag, MD  bisoprolol (ZEBETA) 5 MG tablet Take 0.5 tablets (2.5 mg total) by mouth daily. 09/30/20   Charlynne Cousins, MD  Blood Glucose Monitoring Suppl (BLOOD GLUCOSE SYSTEM PAK) KIT Please dispense per patient and insurance preference. Use as directed to monitor FSBS 4x . Dx: E11.65 09/08/20   [name], MD  cetirizine (ZYRTEC) 10 MG tablet Take 10 mg by mouth daily.    [provider]  cholecalciferol (VITAMIN D) 25 MCG (1000 UT) tablet Take 1,000 Units by mouth daily.     [provider]  Continuous Blood Gluc Receiver (FREESTYLE LIBRE 14 DAY READER) DEVI Apply 1 each topically daily as needed. USE AS DIRECTED TO MONITOR BLOOD GLUCOSE LEVELS. DX: E11.9. 09/16/20   [name], MD  Continuous Blood Gluc Sensor (FREESTYLE LIBRE 14 DAY SENSOR) MISC Apply 1 each topically every 14 (fourteen) days. USE AS DIRECTED TO MONITOR BLOOD GLUCOSE LEVELS. DX: E11.9. 09/16/20   [name], MD  cyproheptadine (PERIACTIN) 4 MG tablet TAKE 2 TABLETS BY MOUTH EVERY EVENING Patient taking differently: Take 8 mg by mouth every evening. 02/02/20   [name], MD  DEXILANT 60 MG capsule TAKE 1 CAPSULE BY MOUTH [facility] MORNING 02/02/20  [name], MD  diclofenac Sodium (VOLTAREN) 1 % GEL Apply 2 g topically 4 (four) times daily as needed (joint pain). 05/26/20   [name], MD  EPINEPHrine (EPIPEN 2-PAK) 0.3 mg/0.3 mL IJ SOAJ injection Inject 0.3 mLs (0.3 mg total) into the muscle Once PRN. 01/23/19   [name], MD  famotidine (PEPCID) 20 MG tablet TAKE 1 TABLET BY MOUTH TWICE A DAY Patient taking differently: Take 20 mg by mouth at bedtime. 02/01/20   Kozlow, Donnamarie Poag, MD  fluconazole (DIFLUCAN) 200 MG tablet Take 1 tablet (200 mg total) by mouth daily for 21 days. 09/30/20 10/21/20  Charlynne Cousins, MD  fluticasone (FLONASE) 50 MCG/ACT nasal spray USE 2 SPRAYS IN EACH NOSTRIL DAILY Patient taking differently: Place 2 sprays into both nostrils daily. 09/02/20   Freddi Starr, MD  folic acid (FOLVITE) 1 MG tablet Take 1 mg by mouth daily.    [provider]  furosemide (LASIX) 40 MG tablet Take 1 tablet (40 mg total) by mouth daily. 80 mg in the AM and 40 mg in the PM 10/02/20   Charlynne Cousins, MD  gabapentin (NEURONTIN) 600 MG tablet TAKE 1 TABLET BY MOUTH DAILY Patient taking differently: Take 600 mg by mouth at bedtime. 02/03/20   [name], MD  Glucose Blood (BLOOD GLUCOSE TEST STRIPS) STRP Please dispense per patient and insurance preference. Use as directed to monitor FSBS 4x . Dx: E11.65 09/08/20   [name], MD  guaiFENesin (MUCINEX) 600 MG 12 hr tablet Take 1 tablet (600 mg total) by mouth 2 (two) times daily as needed for cough or to loosen phlegm. 05/09/20   [name], MD  leflunomide (ARAVA) 20 MG tablet Take 20 mg by mouth daily.    [provider]  magnesium oxide (MAG-OX) 400 MG tablet Take 400 mg by mouth daily.    [provider]  meclizine (ANTIVERT) 25 MG tablet Take 25 mg by mouth 3 (three) times daily as needed for dizziness.    [provider]   mirtazapine (REMERON SOL-TAB) 30 MG disintegrating tablet DISSOLVE 1 TABLET BY MOUTH EVERY NIGHT AT BEDTIME 05/26/20   [name], MD  MITIGARE 0.6 MG CAPS Take 0.6 mg by mouth every other day. 01/16/20   [provider]  montelukast (SINGULAIR) 10 MG tablet TAKE 1 TABLET BY MOUTH DAILY 09/05/20   Kozlow, Donnamarie Poag, MD  predniSONE (DELTASONE) 1 MG tablet Take 12 tablets (12 mg total) by mouth daily with breakfast. 09/30/20   Charlynne Cousins, MD  Propylene Glycol (SYSTANE COMPLETE) 0.6 % SOLN Place 1 drop into both eyes 2 (two) times daily.    [provider]  sacubitril-valsartan (ENTRESTO) 49-51 MG Take 1 tablet by mouth 2 (two) times daily. 09/30/20   Charlynne Cousins, MD  tiotropium (SPIRIVA) 18 MCG inhalation capsule Place 18 mcg into inhaler and inhale daily.    [provider]  Tiotropium Bromide-Olodaterol (STIOLTO RESPIMAT) 2.5-2.5 MCG/ACT AERS Inhale 2 puffs into the lungs daily. 08/05/20   Freddi Starr, MD  traMADol (ULTRAM) 50 MG tablet Take 50 mg by mouth at bedtime.    [provider]  XARELTO 20 MG TABS tablet TAKE 1 TABLET BY MOUTH DAILY WITH SUPPER Patient taking differently: Take 20 mg by mouth every evening. 02/02/20   [name], MD  metolazone (ZAROXOLYN) 5 MG tablet Take 1 tablet (5 mg total) by mouth daily. Patient not taking: No sig  reported 08/25/20 09/23/20  [name], MD  potassium chloride SA (KLOR-CON) 20 MEQ tablet Take 1 tablet (20 mEq total) by mouth daily. Patient not taking: Reported on 09/23/2020 08/30/20 09/23/20  [name], MD    Allergies    Baclofen, Dust mite extract, Molds & smuts, Morphine and related, Other, Penicillins, Rofecoxib, Shellfish allergy, Shrimp flavor, Tetracycline hcl, Xolair [omalizumab], Zoledronic acid, Dilaudid [hydromorphone hcl], Levofloxacin, Oxycodone hcl, Paroxetine, Celecoxib, Diltiazem, Diltiazem hcl, Hydrocodone-acetaminophen, Lactose intolerance (gi), Morphine  sulfate, Oxycodone-acetaminophen, Strawberry flavor, Tree extract, and Penicillin g procaine  Review of Systems   Review of Systems  Constitutional:  Positive for activity change.  All other systems reviewed and are negative.  Physical Exam Updated Vital Signs BP (!) 88/51   Pulse (!) 52   Temp 97.6 F (36.4 C) (Oral)   Resp 17   SpO2 92%   Physical Exam Vitals and nursing note reviewed.  Constitutional:      Appearance: She is well-developed.  HENT:     Head: Atraumatic.  Cardiovascular:     Rate and Rhythm: Normal rate.  Pulmonary:     Effort: Pulmonary effort is normal.  Musculoskeletal:     Cervical back: Normal range of motion and neck supple.     Comments: In a splint, right upper extremity  Skin:    General: Skin is warm and dry.  Neurological:     Mental Status: She is alert and oriented to person, place, and time.    ED Results / Procedures / Treatments   Labs (all labs ordered are listed, but only abnormal results are displayed) Labs Reviewed  SARS CORONAVIRUS 2 (TAT 6-24 HRS)  COMPREHENSIVE METABOLIC PANEL  CBC WITH DIFFERENTIAL/PLATELET  MAGNESIUM  PHOSPHORUS  BRAIN NATRIURETIC PEPTIDE  PROTIME-INR    EKG EKG Interpretation  Date/Time:  Tuesday October 04 2020 15:43:20 EDT Ventricular Rate:  55 PR Interval:  145 QRS Duration: 107 QT Interval:  451 QTC Calculation: 432 R Axis:   77 Text Interpretation: Sinus rhythm Probable lateral infarct, old Probable anteroseptal infarct, recent No acute changes No significant change since last tracing Confirmed by Varney Biles [phone]) on 10/04/2020 5:00:06 PM  Radiology No results found.  Procedures .Critical Care  Date/Time: 10/04/2020 5:07 PM Performed by: Varney Biles, MD Authorized by: Varney Biles, MD   Critical care provider statement:    Critical care time (minutes):  32   Critical care was necessary to treat or prevent imminent or life-threatening deterioration of the following  conditions:  Circulatory failure and renal failure   Critical care was time spent personally by me on the following activities:  Discussions with consultants, evaluation of patient's response to treatment, examination of patient, ordering and performing treatments and interventions, ordering and review of laboratory studies, ordering and review of radiographic studies, pulse oximetry, re-evaluation of patient's condition, obtaining history from patient or surrogate and review of old charts   Medications Ordered in ED Medications  sodium chloride 0.9 % bolus 1,000 mL (has no administration in time range)    ED Course  I have reviewed the triage vital signs and the nursing notes.  Pertinent labs & imaging results that were available during my care of the patient were reviewed by me and considered in my medical decision making (see chart for details).    MDM Rules/Calculators/A&P                           70 year old comes in  with chief complaint of abnormal labs.  It appears that her creatinine is gone up.  I have ordered repeat labs here.  Given her history of CHF, prerenal AKI because of poor perfusion secondary to cardiorenal process is possible.  Clinically, it does not appear that she has reduced p.o. intake.  She also does not appear to be volume overloaded on exam.  Repeat labs ordered, I will order 1 L of normal saline as patient's BP is low in the ER.  Care is signed out to incoming team.  Final Clinical Impression(s) / ED Diagnoses Final diagnoses:  AKI (acute kidney injury) ([place])  Hypotension, unspecified hypotension type    Rx / DC Orders ED Discharge Orders     None        Varney Biles, MD 10/04/20 1710

## 2020-10-05 ENCOUNTER — Other Ambulatory Visit: Payer: Self-pay

## 2020-10-05 DIAGNOSIS — E785 Hyperlipidemia, unspecified: Secondary | ICD-10-CM

## 2020-10-05 DIAGNOSIS — N1832 Chronic kidney disease, stage 3b: Secondary | ICD-10-CM

## 2020-10-05 DIAGNOSIS — E875 Hyperkalemia: Secondary | ICD-10-CM

## 2020-10-05 DIAGNOSIS — I5042 Chronic combined systolic (congestive) and diastolic (congestive) heart failure: Secondary | ICD-10-CM

## 2020-10-05 DIAGNOSIS — N179 Acute kidney failure, unspecified: Secondary | ICD-10-CM

## 2020-10-05 LAB — APTT
aPTT: 54 seconds — ABNORMAL HIGH (ref 24–36)
aPTT: 71 seconds — ABNORMAL HIGH (ref 24–36)
aPTT: 69 seconds — ABNORMAL HIGH (ref 24–36)

## 2020-10-05 LAB — BASIC METABOLIC PANEL
Anion gap: 12 (ref 5–15)
Anion gap: 10 (ref 5–15)
BUN: 89 mg/dL — ABNORMAL HIGH (ref 8–23)
BUN: 106 mg/dL — ABNORMAL HIGH (ref 8–23)
CO2: 21 mmol/L — ABNORMAL LOW (ref 22–32)
CO2: 21 mmol/L — ABNORMAL LOW (ref 22–32)
Calcium: 7.4 mg/dL — ABNORMAL LOW (ref 8.9–10.3)
Calcium: 7.3 mg/dL — ABNORMAL LOW (ref 8.9–10.3)
Chloride: 103 mmol/L (ref 98–111)
Chloride: 110 mmol/L (ref 98–111)
Creatinine, Ser: 3.23 mg/dL — ABNORMAL HIGH (ref 0.44–1.00)
Creatinine, Ser: 2.82 mg/dL — ABNORMAL HIGH (ref 0.44–1.00)
GFR, Estimated: 15 mL/min — ABNORMAL LOW (ref >60)
GFR, Estimated: 17 mL/min — ABNORMAL LOW (ref >60)
Glucose, Bld: 274 mg/dL — ABNORMAL HIGH (ref 70–99)
Glucose, Bld: 160 mg/dL — ABNORMAL HIGH (ref 70–99)
Potassium: 5.8 mmol/L — ABNORMAL HIGH (ref 3.5–5.1)
Potassium: 4.9 mmol/L (ref 3.5–5.1)
Sodium: 136 mmol/L (ref 135–145)
Sodium: 141 mmol/L (ref 135–145)

## 2020-10-05 LAB — CBC
HCT: 32.1 % — ABNORMAL LOW (ref 36.0–46.0)
Hemoglobin: 10 g/dL — ABNORMAL LOW (ref 12.0–15.0)
MCH: 32.4 pg (ref 26.0–34.0)
MCHC: 31.2 g/dL (ref 30.0–36.0)
MCV: 103.9 fL — ABNORMAL HIGH (ref 80.0–100.0)
Platelets: 374 10*3/uL (ref 150–400)
RBC: 3.09 MIL/uL — ABNORMAL LOW (ref 3.87–5.11)
RDW: 16.4 % — ABNORMAL HIGH (ref 11.5–15.5)
WBC: 15.8 10*3/uL — ABNORMAL HIGH (ref 4.0–10.5)
nRBC: 0 % (ref 0.0–0.2)

## 2020-10-05 LAB — HEPARIN LEVEL (UNFRACTIONATED)
Heparin Unfractionated: >1.1 IU/mL — ABNORMAL HIGH (ref 0.30–0.70)
Heparin Unfractionated: >1.1 IU/mL — ABNORMAL HIGH (ref 0.30–0.70)

## 2020-10-05 LAB — GLUCOSE, CAPILLARY: Glucose-Capillary: 211 mg/dL — ABNORMAL HIGH (ref 70–99)

## 2020-10-05 LAB — SARS CORONAVIRUS 2 (TAT 6-24 HRS): SARS Coronavirus 2: NEGATIVE

## 2020-10-05 MED ORDER — INSULIN ASPART 100 UNIT/ML IJ SOLN
0.0000 [IU] | Freq: Three times a day (TID) | INTRAMUSCULAR | Status: DC
  Administered 2020-10-05: 3 [IU] via SUBCUTANEOUS
  Administered 2020-10-06: 1 [IU] via SUBCUTANEOUS
  Administered 2020-10-06: 3 [IU] via SUBCUTANEOUS
  Administered 2020-10-06: 1 [IU] via SUBCUTANEOUS
  Administered 2020-10-07 – 2020-10-08 (×2): 2 [IU] via SUBCUTANEOUS
  Administered 2020-10-09: 5 [IU] via SUBCUTANEOUS
  Administered 2020-10-09: 2 [IU] via SUBCUTANEOUS

## 2020-10-05 MED ORDER — HEPARIN (PORCINE) 25000 UT/250ML-% IV SOLN
1200.0000 [IU]/h | INTRAVENOUS | Status: DC
  Administered 2020-10-05: 950 [IU]/h via INTRAVENOUS
  Administered 2020-10-06: 1200 [IU]/h via INTRAVENOUS
  Filled 2020-10-05 (×2): qty 250

## 2020-10-05 MED ORDER — HYDROCORTISONE NA SUCCINATE PF 100 MG IJ SOLR
25.0000 mg | Freq: Three times a day (TID) | INTRAMUSCULAR | Status: DC
  Administered 2020-10-05 – 2020-10-06 (×2): 25 mg via INTRAVENOUS
  Filled 2020-10-05 (×3): qty 2

## 2020-10-05 MED ORDER — SODIUM ZIRCONIUM CYCLOSILICATE 10 G PO PACK
10.0000 g | PACK | Freq: Once | ORAL | Status: AC
  Administered 2020-10-05: 10 g via ORAL
  Filled 2020-10-05: qty 1

## 2020-10-05 MED ORDER — ALBUTEROL SULFATE (2.5 MG/3ML) 0.083% IN NEBU: 2.5000 mg | INHALATION_SOLUTION | RESPIRATORY_TRACT | Status: DC | PRN

## 2020-10-05 NOTE — Progress Notes (Signed)
Inpatient Diabetes Program Recommendations  AACE/ADA: New Consensus Statement on Inpatient Glycemic Control (2015)  Target Ranges:  Prepandial:   less than 140 mg/dL      Peak postprandial:   less than 180 mg/dL (1-2 hours)      Critically ill patients:  140 - 180 mg/dL   Lab Results  Component Value Date   GLUCAP 198 (H) 09/30/2020   HGBA1C 8.1 (H) 09/22/2020    Review of Glycemic Control  Diabetes history: DM2 Outpatient Diabetes medications: None Current orders for Inpatient glycemic control: None  HgbA1C 8.1%   Inpatient Diabetes Program Recommendations:    Add Novolog 0-9 units TID with meals if appropriate.  Will follow glucose trends.  Thank you. Lorenda Peck, RD, LDN, CDE Inpatient Diabetes Coordinator [phone]

## 2020-10-05 NOTE — Progress Notes (Signed)
ANTICOAGULATION CONSULT NOTE - Initial Consult  Pharmacy Consult for Heparin Indication: DVT  (on Xarelto PTA)  Allergies  Allergen Reactions   Baclofen Other (See Comments)    Altered mental status requiring a 3-day hospital stay- patient became unresponsive   Dust Mite Extract Shortness Of Breath and Other (See Comments)    "sneezing" (02/20/2012)   Molds & Smuts Shortness Of Breath   Morphine And Related Hives and Itching   Other Shortness Of Breath and Other (See Comments)    Grass and weeds "sneezing; filled sinuses" (02/20/2012)   Penicillins Rash and Other (See Comments)    "welts" (02/20/2012) Tolerated Ancef 02/16/20.    Rofecoxib Swelling and Other (See Comments)    Vioxx- feet swelling    Shellfish Allergy Anaphylaxis, Shortness Of Breath, Itching and Rash   Shrimp Flavor Anaphylaxis and Other (See Comments)    ALL SHELLFISH, also   Tetracycline Hcl Nausea And Vomiting   Xolair [Omalizumab] Other (See Comments)    Caused Blood clot   Zoledronic Acid Other (See Comments)    Fever, Put in hospital, dr said it was a reaction from a reaction  Other reaction(s): Unknown   Dilaudid [Hydromorphone Hcl] Itching   Levofloxacin Other (See Comments)    GI upset   Oxycodone Hcl Nausea And Vomiting   Paroxetine Nausea And Vomiting and Other (See Comments)    Paxil    Celecoxib Swelling and Other (See Comments)    Feet swelling   Dilaudid [Hydromorphone] Other (See Comments)    "Allergic," per [facility]   Diltiazem Swelling   Diltiazem Hcl Swelling   Hydrocodone-Acetaminophen Nausea And Vomiting and Other (See Comments)    "Allergic," per [facility]   Lactose Intolerance (Gi) Other (See Comments)    Bloating and gas   Morphine Sulfate Other (See Comments)    "Gets loopy"   Oxycodone-Acetaminophen Other (See Comments)    Reaction??   Strawberry Flavor Other (See Comments)    Reaction?   Tree Extract Other (See Comments)    "tested and told I was allergic to it; never experienced a  reaction to it" (02/20/2012)   Penicillin G Procaine Rash and Other (See Comments)    Welts, also    Patient Measurements:   Heparin Dosing Weight: 60 kg  Vital Signs: Temp: 97.8 F (36.6 C) (08/17 1858) Temp Source: Oral (08/17 1858) BP: 108/61 (08/17 1858) Pulse Rate: 55 (08/17 1858)  Labs: Recent Labs    10/04/20 1455 10/04/20 2257 10/05/20 0447 10/05/20 1030 10/05/20 1545 10/05/20 2055  HGB 11.3*  --  10.0*  --   --   --   HCT 35.8*  --  32.1*  --   --   --   PLT 372  --  374  --   --   --   APTT  --   --   --  54* 71* 69*  LABPROT  --  19.1*  --   --   --   --   INR  --  1.6*  --   --   --   --   HEPARINUNFRC  --   --   --  >1.10* >1.10*  --   CREATININE 4.07* 3.23* 2.82*  --   --   --      Estimated Creatinine Clearance: 15.5 mL/min (A) (by C-G formula based on SCr of 2.82 mg/dL (H)).   Medications:  PTA Xarelto '20mg'$  daily in the evening  Assessment: 70 yr female with h/o DVT  and on Xarelto PTA admitted to hospital for acute renal failure CrCl < 15 ml/min so IV heparin to be started for anticoagulation while Xarelto is currently held Xarelto's effects will falsely elevate heparin level, so will monitor heparin gtt with aPTT until effects of Xarelto have worn off and the aPTT and HL correlate  10/05/20 10:18 PM  Baseline HL elevated as expected 9 pm aPTT now improved to therapeutic, but remains borderline low 1545 levels were drawn in error and should be disregarded CBC stable No bleeding or infusion-related issues per RN  Goal of Therapy:  Heparin level 0.3-0.7 units/ml aPTT 66-102 seconds Monitor platelets by anticoagulation protocol: Yes   Plan:  Increase heparin infusion to 1200 units/hr Daily CBC, aPTT (until levels correlate), and heparin level  Girlie Veltri A, PharmD 10/05/2020,10:18 PM

## 2020-10-05 NOTE — Progress Notes (Signed)
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  She underwent incision and drainage on 09/22/2020.  Cardiology was consulted, and she was started on Entresto and bisoprolol.  Discharged on 09/30/2020.  Patient was readmitted with AKI, creatinine of 4.07.  Her creatinine was 1.8 on discharge on 11th August.    Subjective   Patient seen and examined, denies shortness of breath.  Creatinine has improved to 2.82 with IV normal saline.   Assessment/Plan:    Acute on chronic kidney disease stage III -Baseline creatinine around 1.6-1.8 -Presented with creatinine of 4.07; likely from Entresto and hypotension -Ebony Scott is currently on hold -Started on IV normal saline; creatinine improved to 2.82 today -Follow BMP in am  Chronic combined systolic and diastolic CHF -Last EF was 35 to 40% in July 2022 -Entresto and bisoprolol were held -We will consult cardiology for adjustment of medications  Diabetes mellitus type 2 -Hemoglobin A1c was 8.1, 2 weeks ago -CBGs elevated due to steroids -We will start weaning off steroids at this time -Start sliding scale insulin with NovoLog  History of rheumatoid arthritis -Patient is on prednisone and Arava at home -Started on Solu-Cortef stress dose 50 mg every 8 hours -We will start weaning of Solu-Cortef  History of DVT/PE -Patient is on Xarelto at home -Started on heparin per pharmacy  Recent septic olecranon  bursitis -S/p debridement during last admission -Culture grew Candida albicans; started on fluconazole till October 20, 2020   Scheduled medications:    allopurinol  100 mg Oral Daily   arformoterol  15 mcg Nebulization BID   And   umeclidinium bromide  1 puff Inhalation Daily   atorvastatin  10 mg Oral Daily   bisoprolol  2.5 mg Oral Daily   colchicine  0.6 mg Oral QODAY   cyproheptadine  8 mg Oral QPM   fluconazole  200 mg Oral Daily   folic acid  1 mg Oral Daily   gabapentin  300 mg Oral QHS   hydrocortisone sod succinate (SOLU-CORTEF) inj  50 mg Intravenous Q8H   loratadine  10 mg Oral Daily   magnesium oxide  400 mg Oral Daily   mirtazapine  30 mg Oral QHS   montelukast  10 mg Oral Daily   pantoprazole  40 mg Oral Daily         Data Reviewed:   CBG:  Recent Labs  Lab 09/29/20 1142 09/29/20 1624 09/29/20 2108 09/30/20 0731 09/30/20 1228  GLUCAP 185* 209* 199* 83 198*    SpO2: 94 %    Vitals:   10/05/20 1100 10/05/20 1200 10/05/20 1324 10/05/20 1436  BP: 111/63 (!) 164/62 (!) 113/58 116/61  Pulse: (!) 58 (!) 59 62 68  Resp: '18 18 17 20  '$ Temp:    97.8 F (36.6 C)  TempSrc:    Oral  SpO2: 97% 99% 96% 94%     Intake/Output Summary (Last 24 hours) at 10/05/2020 1508 Last data filed at 10/05/2020 0449 Gross per 24 hour  Intake 2000 ml  Output --  Net 2000 ml    08/15 1901 - 08/17 0700 In: 2000 [I.V.:1000] Out: -   There were no vitals filed for this visit.  CBC:  Recent Labs  Lab 10/04/20 1455 10/05/20 0447  WBC 15.8* 15.8*  HGB 11.3* 10.0*  HCT 35.8* 32.1*  PLT 372 374  MCV 104.1* 103.9*  MCH 32.8 32.4  MCHC 31.6 31.2  RDW 16.3* 16.4*  LYMPHSABS 0.5*  --   MONOABS 1.0  --   EOSABS 0.0  --   BASOSABS 0.1  --     Complete metabolic panel:  Recent Labs  Lab 09/29/20 0515 09/29/20 0914 10/04/20 1455 10/04/20 2257 10/05/20 0447  NA  --  137 131* 136 141  K  --  4.4 6.0* 5.8* 4.9  CL  --  99 97* 103 110  CO2  --  25 21*  21* 21*  GLUCOSE  --  214* 345* 274* 160*  BUN  --  45* 127* 89* 106*  CREATININE  --  1.83* 4.07* 3.23* 2.82*  CALCIUM  --  9.2 7.7* 7.4* 7.3*  AST  --   --  98*  --   --   ALT  --   --  132*  --   --   ALKPHOS  --   --  268*  --   --   BILITOT  --   --  0.5  --   --   ALBUMIN  --   --  2.6*  --   --   MG  --   --  3.1*  --   --   CRP 24.3*  --   --   --   --   INR  --   --   --  1.6*  --   BNP  --   --  1,083.7*  --   --     No results for input(s): LIPASE, AMYLASE in the last 168 hours.  Recent Labs  Lab 09/29/20 0515 09/30/20 1020 10/04/20 1455 10/04/20 1724  CRP 24.3*  --   --   --   BNP  --   --  1,083.7*  --   SARSCOV2NAA  --  NEGATIVE  --  NEGATIVE    ------------------------------------------------------------------------------------------------------------------ No results for input(s): CHOL, HDL, LDLCALC, TRIG, CHOLHDL, LDLDIRECT in the last 72 hours.  Lab Results  Component Value Date   HGBA1C 8.1 (H) 09/22/2020   ------------------------------------------------------------------------------------------------------------------ No results for input(s): TSH, T4TOTAL, T3FREE, THYROIDAB in the last 72 hours.  Invalid input(s): FREET3 ------------------------------------------------------------------------------------------------------------------ No results for input(s): VITAMINB12, FOLATE, FERRITIN, TIBC, IRON, RETICCTPCT in the last 72 hours.  Coagulation profile Recent Labs  Lab 10/04/20 2257  INR 1.6*   No results for input(s): DDIMER in the last 72 hours.  Cardiac Enzymes No results for input(s): CKTOTAL, CKMB, CKMBINDEX, TROPONINI in the last 168 hours.  ------------------------------------------------------------------------------------------------------------------    Component Value Date/Time   BNP 1,083.7 (H) 10/04/2020 1455   BNP 494 (H) 08/25/2020 1435     Antibiotics: Anti-infectives (From admission, onward)    Start     Dose/Rate  Route Frequency Ordered Stop   10/05/20 1000  fluconazole (DIFLUCAN) tablet 200 mg        200 mg Oral Daily 10/04/20 2106          Radiology Reports  DG Chest Port 1 View  Result Date: 10/04/2020 CLINICAL DATA:  Possible fluid overload with increased shortness of breath, initial encounter EXAM: PORTABLE CHEST 1  VIEW COMPARISON:  09/22/2020 FINDINGS: Postsurgical changes are again noted. Cardiac shadow is enlarged but stable. Lungs are clear. No significant vascular congestion is noted. No bony abnormality is noted. IMPRESSION: No findings to suggest fluid overload. Electronically Signed   By: Inez Catalina M.D.   On: 10/04/2020 17:18   CT RENAL STONE STUDY  Result Date: 10/04/2020 CLINICAL DATA:  Urinary tract stone, symptomatic/complicated. Elevated creatinine EXAM: CT ABDOMEN AND PELVIS WITHOUT CONTRAST TECHNIQUE: Multidetector CT imaging of the abdomen and pelvis was performed following the standard protocol without IV contrast. COMPARISON:  None. FINDINGS: Lower chest: Coronary artery calcifications.  Bibasilar scarring. Hepatobiliary: No focal liver abnormality. Status post cholecystectomy. No biliary dilatation. Pancreas: No focal lesion. Normal pancreatic contour. No surrounding inflammatory changes. No main pancreatic ductal dilatation. Spleen: Normal in size without focal abnormality. Adrenals/Urinary Tract: No adrenal nodule bilaterally. Bilateral renal cortical scarring and atrophy. No nephrolithiasis and no hydronephrosis. No definite contour-deforming renal mass. No ureterolithiasis or hydroureter. The urinary bladder is unremarkable. Stomach/Bowel: Stomach is within normal limits. No evidence of bowel wall thickening or dilatation. Couple scattered colonic diverticula. Appendix appears normal. Vascular/Lymphatic: No abdominal aorta or iliac aneurysm. Mild atherosclerotic plaque of the aorta and its branches. No abdominal, pelvic, or inguinal lymphadenopathy. Reproductive: Status post  hysterectomy. No adnexal masses. Other: No intraperitoneal free fluid. No intraperitoneal free gas. No organized fluid collection. Musculoskeletal: Tiny fat containing umbilical hernia. Asymmetrically atrophic right iliopsoas muscle. No suspicious lytic or blastic osseous lesions. No acute displaced fracture. Grade 1 anterolisthesis of L4 on L5 with associated degenerative changes. IMPRESSION: 1. No acute intra-abdominal or intrapelvic abnormality with limited evaluation on this noncontrast study. 2. Couple scattered colon diverticula with no acute diverticulitis. 3. Atrophic scarred kidneys. 4.  Aortic Atherosclerosis (ICD10-I70.0). Electronically Signed   By: Iven Finn M.D.   On: 10/04/2020 19:58      DVT prophylaxis: Patient on IV heparin  Code Status: DNR  Family Communication: No family at bedside   Consultants:   Procedures:     Objective    Physical Examination:  General-appears in no acute distress Heart-S1-S2, regular, no murmur auscultated Lungs-clear to auscultation bilaterally, no wheezing or crackles auscultated Abdomen-soft, nontender, no organomegaly Extremities-no edema in the lower extremities Neuro-alert, oriented x3, no focal deficit noted  Status is: Inpatient  Dispo: The patient is from: Skilled nursing facility              Anticipated d/c is to: Skilled nursing facility              Anticipated d/c date is: 10/06/2020              Patient currently not stable for discharge  Barrier to discharge-acute kidney injury  COVID-19 Labs  No results for input(s): DDIMER, FERRITIN, LDH, CRP in the last 72 hours.  Lab Results  Component Value Date   [place] NEGATIVE 10/04/2020   [place] NEGATIVE 09/30/2020   [place] NEGATIVE 09/22/2020   [place] NEGATIVE 04/02/2020    Microbiology  Recent Results (from the past 240 hour(s))  Resp Panel by RT-PCR (Flu A&B, Covid) Nasopharyngeal Swab     Status: None   Collection Time: 09/30/20  10:20 AM   Specimen: Nasopharyngeal Swab; Nasopharyngeal(NP) swabs in vial transport medium  Result Value Ref Range Status   SARS Coronavirus 2 by RT PCR NEGATIVE NEGATIVE Final    Comment: (NOTE) SARS-CoV-2 target nucleic acids are NOT DETECTED.  The SARS-CoV-2 RNA is generally detectable in upper respiratory specimens during the  acute phase of infection. The lowest concentration of SARS-CoV-2 viral copies this assay can detect is 138 copies/mL. A negative result does not preclude SARS-Cov-2 infection and should not be used as the sole basis for treatment or other patient management decisions. A negative result may occur with  improper specimen collection/handling, submission of specimen other than nasopharyngeal swab, presence of viral mutation(s) within the areas targeted by this assay, and inadequate number of viral copies(<138 copies/mL). A negative result must be combined with clinical observations, patient history, and epidemiological information. The expected result is Negative.  Fact Sheet for Patients:  EntrepreneurPulse.com.au  Fact Sheet for Healthcare Providers:  IncredibleEmployment.be  This test is no t yet approved or cleared by the [place] FDA and  has been authorized for detection and/or diagnosis of SARS-CoV-2 by FDA under an Emergency Use Authorization (EUA). This EUA will remain  in effect (meaning this test can be used) for the duration of the COVID-19 declaration under Section 564(b)(1) of the Act, 21 U.S.C.section 360bbb-3(b)(1), unless the authorization is terminated  or revoked sooner.       Influenza A by PCR NEGATIVE NEGATIVE Final   Influenza B by PCR NEGATIVE NEGATIVE Final    Comment: (NOTE) The Xpert Xpress SARS-CoV-2/FLU/RSV plus assay is intended as an aid in the diagnosis of influenza from Nasopharyngeal swab specimens and should not be used as a sole basis for treatment. Nasal washings and aspirates  are unacceptable for Xpert Xpress SARS-CoV-2/FLU/RSV testing.  Fact Sheet for Patients: EntrepreneurPulse.com.au  Fact Sheet for Healthcare Providers: IncredibleEmployment.be  This test is not yet approved or cleared by the [place] FDA and has been authorized for detection and/or diagnosis of SARS-CoV-2 by FDA under an Emergency Use Authorization (EUA). This EUA will remain in effect (meaning this test can be used) for the duration of the COVID-19 declaration under Section 564(b)(1) of the Act, 21 U.S.C. section 360bbb-3(b)(1), unless the authorization is terminated or revoked.  Performed at [facility], [place] [address], [place], [place] [address]   SARS CORONAVIRUS 2 (TAT 6-24 HRS) Nasopharyngeal Nasopharyngeal Swab     Status: None   Collection Time: 10/04/20  5:24 PM   Specimen: Nasopharyngeal Swab  Result Value Ref Range Status   SARS Coronavirus 2 NEGATIVE NEGATIVE Final    Comment: (NOTE) SARS-CoV-2 target nucleic acids are NOT DETECTED.  The SARS-CoV-2 RNA is generally detectable in upper and lower respiratory specimens during the acute phase of infection. Negative results do not preclude SARS-CoV-2 infection, do not rule out co-infections with other pathogens, and should not be used as the sole basis for treatment or other patient management decisions. Negative results must be combined with clinical observations, patient history, and epidemiological information. The expected result is Negative.  Fact Sheet for Patients: SugarRoll.be  Fact Sheet for Healthcare Providers: https://www.woods-mathews.com/  This test is not yet approved or cleared by the [place] FDA and  has been authorized for detection and/or diagnosis of SARS-CoV-2 by FDA under an Emergency Use Authorization (EUA). This EUA will remain  in effect (meaning this test can be used) for the duration  of the COVID-19 declaration under Se ction 564(b)(1) of the Act, 21 U.S.C. section 360bbb-3(b)(1), unless the authorization is terminated or revoked sooner.  Performed at [facility], [place] [address] [place], [place] [address]              [facility] If 7PM-7AM, please contact night-coverage at [email or web address], Office  [phone]   10/05/2020, 3:08 PM  LOS: 1 day

## 2020-10-05 NOTE — Progress Notes (Signed)
Subjective: Patient called our office today alerting [place] to her inpatient status and asked for me to come and change her dressing while in the ED.   Patient is alert, oriented. Reports no pain at right hand. Mild-moderate pain at right elbow.  Denies chest pain, SOB, Calf pain. No nausea/vomiting. No other complaints.   Objective:  PE: VITALS:   Vitals:   10/05/20 0900 10/05/20 1100 10/05/20 1200 10/05/20 1324  BP: (!) 119/58 111/63 (!) 164/62 (!) 113/58  Pulse: 64 (!) 58 (!) 59 62  Resp: (!) '23 18 18 17  '$ Temp:      TempSrc:      SpO2: 98% 97% 99% 96%   General: In no acute distress, alert and awake MSK: RUE in splint. When removed, right olecranon bursa and right third MCP joints look to be healing well without new erythema or fluid collections. Full ROM at right elbow with mild pain.Able to flex, extend, and abduct all fingers of right hand, though motion limited due to RA. Distal sensation intact. Capillary refill intact.    LABS  Results for orders placed or performed during the hospital encounter of 10/04/20 (from the past 24 hour(s))  Comprehensive metabolic panel     Status: Abnormal   Collection Time: 10/04/20  2:55 PM  Result Value Ref Range   Sodium 131 (L) 135 - 145 mmol/L   Potassium 6.0 (H) 3.5 - 5.1 mmol/L   Chloride 97 (L) 98 - 111 mmol/L   CO2 21 (L) 22 - 32 mmol/L   Glucose, Bld 345 (H) 70 - 99 mg/dL   BUN 127 (H) 8 - 23 mg/dL   Creatinine, Ser 4.07 (H) 0.44 - 1.00 mg/dL   Calcium 7.7 (L) 8.9 - 10.3 mg/dL   Total Protein 6.6 6.5 - 8.1 g/dL   Albumin 2.6 (L) 3.5 - 5.0 g/dL   AST 98 (H) 15 - 41 U/L   ALT 132 (H) 0 - 44 U/L   Alkaline Phosphatase 268 (H) 38 - 126 U/L   Total Bilirubin 0.5 0.3 - 1.2 mg/dL   GFR, Estimated 11 (L) >60 mL/min   Anion gap 13 5 - 15  CBC with Differential     Status: Abnormal   Collection Time: 10/04/20  2:55 PM  Result Value Ref Range   WBC 15.8 (H) 4.0 - 10.5 K/uL   RBC 3.44 (L) 3.87 - 5.11 MIL/uL   Hemoglobin 11.3  (L) 12.0 - 15.0 g/dL   HCT 35.8 (L) 36.0 - 46.0 %   MCV 104.1 (H) 80.0 - 100.0 fL   MCH 32.8 26.0 - 34.0 pg   MCHC 31.6 30.0 - 36.0 g/dL   RDW 16.3 (H) 11.5 - 15.5 %   Platelets 372 150 - 400 K/uL   nRBC 0.0 0.0 - 0.2 %   Neutrophils Relative % 87 %   Neutro Abs 13.6 (H) 1.7 - 7.7 K/uL   Lymphocytes Relative 3 %   Lymphs Abs 0.5 (L) 0.7 - 4.0 K/uL   Monocytes Relative 6 %   Monocytes Absolute 1.0 0.1 - 1.0 K/uL   Eosinophils Relative 0 %   Eosinophils Absolute 0.0 0.0 - 0.5 K/uL   Basophils Relative 0 %   Basophils Absolute 0.1 0.0 - 0.1 K/uL   Immature Granulocytes 4 %   Abs Immature Granulocytes 0.63 (H) 0.00 - 0.07 K/uL  Magnesium     Status: Abnormal   Collection Time: 10/04/20  2:55 PM  Result Value Ref Range  Magnesium 3.1 (H) 1.7 - 2.4 mg/dL  Phosphorus     Status: Abnormal   Collection Time: 10/04/20  2:55 PM  Result Value Ref Range   Phosphorus 6.5 (H) 2.5 - 4.6 mg/dL  Brain natriuretic peptide     Status: Abnormal   Collection Time: 10/04/20  2:55 PM  Result Value Ref Range   B Natriuretic Peptide 1,083.7 (H) 0.0 - 100.0 pg/mL  SARS CORONAVIRUS 2 (TAT 6-24 HRS) Nasopharyngeal Nasopharyngeal Swab     Status: None   Collection Time: 10/04/20  5:24 PM   Specimen: Nasopharyngeal Swab  Result Value Ref Range   SARS Coronavirus 2 NEGATIVE NEGATIVE  Protime-INR     Status: Abnormal   Collection Time: 10/04/20 10:57 PM  Result Value Ref Range   Prothrombin Time 19.1 (H) 11.4 - 15.2 seconds   INR 1.6 (H) 0.8 - 1.2  Basic metabolic panel     Status: Abnormal   Collection Time: 10/04/20 10:57 PM  Result Value Ref Range   Sodium 136 135 - 145 mmol/L   Potassium 5.8 (H) 3.5 - 5.1 mmol/L   Chloride 103 98 - 111 mmol/L   CO2 21 (L) 22 - 32 mmol/L   Glucose, Bld 274 (H) 70 - 99 mg/dL   BUN 89 (H) 8 - 23 mg/dL   Creatinine, Ser 3.23 (H) 0.44 - 1.00 mg/dL   Calcium 7.4 (L) 8.9 - 10.3 mg/dL   GFR, Estimated 15 (L) >60 mL/min   Anion gap 12 5 - 15  Basic metabolic panel      Status: Abnormal   Collection Time: 10/05/20  4:47 AM  Result Value Ref Range   Sodium 141 135 - 145 mmol/L   Potassium 4.9 3.5 - 5.1 mmol/L   Chloride 110 98 - 111 mmol/L   CO2 21 (L) 22 - 32 mmol/L   Glucose, Bld 160 (H) 70 - 99 mg/dL   BUN 106 (H) 8 - 23 mg/dL   Creatinine, Ser 2.82 (H) 0.44 - 1.00 mg/dL   Calcium 7.3 (L) 8.9 - 10.3 mg/dL   GFR, Estimated 17 (L) >60 mL/min   Anion gap 10 5 - 15  CBC     Status: Abnormal   Collection Time: 10/05/20  4:47 AM  Result Value Ref Range   WBC 15.8 (H) 4.0 - 10.5 K/uL   RBC 3.09 (L) 3.87 - 5.11 MIL/uL   Hemoglobin 10.0 (L) 12.0 - 15.0 g/dL   HCT 32.1 (L) 36.0 - 46.0 %   MCV 103.9 (H) 80.0 - 100.0 fL   MCH 32.4 26.0 - 34.0 pg   MCHC 31.2 30.0 - 36.0 g/dL   RDW 16.4 (H) 11.5 - 15.5 %   Platelets 374 150 - 400 K/uL   nRBC 0.0 0.0 - 0.2 %  APTT     Status: Abnormal   Collection Time: 10/05/20 10:30 AM  Result Value Ref Range   aPTT 54 (H) 24 - 36 seconds  Heparin level (unfractionated)     Status: Abnormal   Collection Time: 10/05/20 10:30 AM  Result Value Ref Range   Heparin Unfractionated >1.10 (H) 0.30 - 0.70 IU/mL   *Note: Due to a large number of results and/or encounters for the requested time period, some results have not been displayed. A complete set of results can be found in Results Review.    DG Chest Port 1 View  Result Date: 10/04/2020 CLINICAL DATA:  Possible fluid overload with increased shortness of breath, initial encounter EXAM: PORTABLE  CHEST 1 VIEW COMPARISON:  09/22/2020 FINDINGS: Postsurgical changes are again noted. Cardiac shadow is enlarged but stable. Lungs are clear. No significant vascular congestion is noted. No bony abnormality is noted. IMPRESSION: No findings to suggest fluid overload. Electronically Signed   By: Inez Catalina M.D.   On: 10/04/2020 17:18   CT RENAL STONE STUDY  Result Date: 10/04/2020 CLINICAL DATA:  Urinary tract stone, symptomatic/complicated. Elevated creatinine EXAM: CT  ABDOMEN AND PELVIS WITHOUT CONTRAST TECHNIQUE: Multidetector CT imaging of the abdomen and pelvis was performed following the standard protocol without IV contrast. COMPARISON:  None. FINDINGS: Lower chest: Coronary artery calcifications.  Bibasilar scarring. Hepatobiliary: No focal liver abnormality. Status post cholecystectomy. No biliary dilatation. Pancreas: No focal lesion. Normal pancreatic contour. No surrounding inflammatory changes. No main pancreatic ductal dilatation. Spleen: Normal in size without focal abnormality. Adrenals/Urinary Tract: No adrenal nodule bilaterally. Bilateral renal cortical scarring and atrophy. No nephrolithiasis and no hydronephrosis. No definite contour-deforming renal mass. No ureterolithiasis or hydroureter. The urinary bladder is unremarkable. Stomach/Bowel: Stomach is within normal limits. No evidence of bowel wall thickening or dilatation. Couple scattered colonic diverticula. Appendix appears normal. Vascular/Lymphatic: No abdominal aorta or iliac aneurysm. Mild atherosclerotic plaque of the aorta and its branches. No abdominal, pelvic, or inguinal lymphadenopathy. Reproductive: Status post hysterectomy. No adnexal masses. Other: No intraperitoneal free fluid. No intraperitoneal free gas. No organized fluid collection. Musculoskeletal: Tiny fat containing umbilical hernia. Asymmetrically atrophic right iliopsoas muscle. No suspicious lytic or blastic osseous lesions. No acute displaced fracture. Grade 1 anterolisthesis of L4 on L5 with associated degenerative changes. IMPRESSION: 1. No acute intra-abdominal or intrapelvic abnormality with limited evaluation on this noncontrast study. 2. Couple scattered colon diverticula with no acute diverticulitis. 3. Atrophic scarred kidneys. 4.  Aortic Atherosclerosis (ICD10-I70.0). Electronically Signed   By: Iven Finn M.D.   On: 10/04/2020 19:58    Assessment/Plan: Septic right elbow olecranon bursitis Right dorsal infected  rheumatoid nodule, hand, overlying the third metacarpal phalangeal joint. - s/p irrigation and debridement of right hand and elbow 09/22/20, cultures grew candida albicans - WBAT RUE - please continue daily dressing changes with adaptic, 4x4's, and gauze wrap. I have discontinue splint VTE prophylaxis: Home xarelto Pain control: continue home regimen, patient very sensitive to narcotics and muscle relaxers Follow - up plan: 1 week after discharge with Dr. Mardelle Matte  AKI, other medical comorbidities - per primary  Will continue to follow along intermittently while inpatient, please reach out for any urgent concerns.  Contact information:   Weekdays 8-5 [address], [place] [phone] A fter hours and holidays please check [email or web address] for group call information for [facility]  Ventura Bruns 10/05/2020, 2:28 PM

## 2020-10-05 NOTE — ED Notes (Signed)
Patient bed linen changed, new purewick placed. New brief in place. Denies further needs at this time

## 2020-10-05 NOTE — ED Notes (Signed)
Patient bed linen changed. New purewick in place. Right foot wound redressed

## 2020-10-05 NOTE — Progress Notes (Signed)
ANTICOAGULATION CONSULT NOTE - Initial Consult  Pharmacy Consult for Heparin Indication: DVT  (on Xarelto PTA)  Allergies  Allergen Reactions   Baclofen Other (See Comments)    Altered mental status requiring a 3-day hospital stay- patient became unresponsive   Dust Mite Extract Shortness Of Breath and Other (See Comments)    "sneezing" (02/20/2012)   Molds & Smuts Shortness Of Breath   Morphine And Related Hives and Itching   Other Shortness Of Breath and Other (See Comments)    Grass and weeds "sneezing; filled sinuses" (02/20/2012)   Penicillins Rash and Other (See Comments)    "welts" (02/20/2012) Tolerated Ancef 02/16/20.    Rofecoxib Swelling and Other (See Comments)    Vioxx- feet swelling    Shellfish Allergy Anaphylaxis, Shortness Of Breath, Itching and Rash   Shrimp Flavor Anaphylaxis and Other (See Comments)    ALL SHELLFISH, also   Tetracycline Hcl Nausea And Vomiting   Xolair [Omalizumab] Other (See Comments)    Caused Blood clot   Zoledronic Acid Other (See Comments)    Fever, Put in hospital, dr said it was a reaction from a reaction  Other reaction(s): Unknown   Dilaudid [Hydromorphone Hcl] Itching   Levofloxacin Other (See Comments)    GI upset   Oxycodone Hcl Nausea And Vomiting   Paroxetine Nausea And Vomiting and Other (See Comments)    Paxil    Celecoxib Swelling and Other (See Comments)    Feet swelling   Dilaudid [Hydromorphone] Other (See Comments)    "Allergic," per [facility]   Diltiazem Swelling   Diltiazem Hcl Swelling   Hydrocodone-Acetaminophen Nausea And Vomiting and Other (See Comments)    "Allergic," per [facility]   Lactose Intolerance (Gi) Other (See Comments)    Bloating and gas   Morphine Sulfate Other (See Comments)    "Gets loopy"   Oxycodone-Acetaminophen Other (See Comments)    Reaction??   Strawberry Flavor Other (See Comments)    Reaction?   Tree Extract Other (See Comments)    "tested and told I was allergic to it; never experienced a  reaction to it" (02/20/2012)   Penicillin G Procaine Rash and Other (See Comments)    Welts, also    Patient Measurements:   Heparin Dosing Weight: 60 kg  Vital Signs: BP: 111/63 (08/17 1100) Pulse Rate: 58 (08/17 1100)  Labs: Recent Labs    10/04/20 1455 10/04/20 2257 10/05/20 0447 10/05/20 1030  HGB 11.3*  --  10.0*  --   HCT 35.8*  --  32.1*  --   PLT 372  --  374  --   APTT  --   --   --  54*  LABPROT  --  19.1*  --   --   INR  --  1.6*  --   --   HEPARINUNFRC  --   --   --  >1.10*  CREATININE 4.07* 3.23* 2.82*  --      Estimated Creatinine Clearance: 15.5 mL/min (A) (by C-G formula based on SCr of 2.82 mg/dL (H)).   Medical History: Past Medical History:  Diagnosis Date   Abscess of dorsum of right hand 09/22/2020   Acute deep vein thrombosis (DVT) of right lower extremity ([place]) 12/13/2017   Allergic rhinitis    Anemia    Angio-edema    Anxiety    pt denies   Arthritis    [employer]reesia 10/21/2019   Asthma    [employer]ia 10/21/2019   Breast cancer ([place]) 1998  in remission   Cataract    REMOVED   Clostridium difficile colitis 01/14/2018   Clostridium difficile diarrhea [id number]   Complication of anesthesia    "had hard time waking up from it several times" (02/20/2012)   Depression    "some; don't take anything for it" (02/20/2012)   Diverticulosis    DVT (deep venous thrombosis) ([place])    Fibromyalgia 11/2011   GERD (gastroesophageal reflux disease)    Graves disease    Headache(784.0)    "related to allergies; more at different times during the year" (02/20/2012)   Hemorrhoids    Hiatal hernia    back and neck   Hx of adenomatous colonic polyps 04/12/2016   Hypercholesteremia    good cholesterol is high   Hypothyroidism    IBS (irritable bowel syndrome)    Moderate persistent asthma    -FeV1 72% 2011, -IgE 102 2011, CT sinus Neg 2011   Osteoporosis    on reclast yearly   Peripheral vascular disease ([place]) 2019   DVTs   Pneumonia 04/2011; ~ 11/2011    "double; single" (02/20/2012)   Septic olecranon bursitis of right elbow 09/22/2020   Seronegative rheumatoid arthritis ([place])    Dr. Lahoma Rocker   SIRS (systemic inflammatory response syndrome) ([place]) 02/10/2018   Tracheobronchomalacia     Medications:  PTA Xarelto '20mg'$  daily in the evening  Assessment: 70 yr female with h/o DVT and on Xarelto PTA admitted to hospital for acute renal failure CrCl < 15 ml/min so IV heparin to be started for anticoagulation while Xarelto is currently held Xarelto's effects will falsely elevate heparin level, so will monitor heparin gtt with aPTT until effects of Xarelto have worn off and the aPTT and HL correlate  10/05/20 12:15 PM  Baseline HL elevated as expected aPTT below goal CBC stable No bleeding or infusion-related issues per RN   Goal of Therapy:  Heparin level 0.3-0.7 units/ml aPTT 66-102 seconds Monitor platelets by anticoagulation protocol: Yes   Plan:  Increase heparin infusion to 1150 units/hr Check HL 8 hours after rate increase Daily CBC, aPTT (until levels correlate), and heparin level  Napoleon Form, PharmD 10/05/2020,12:15 PM

## 2020-10-05 NOTE — Progress Notes (Signed)
ANTICOAGULATION CONSULT NOTE - Initial Consult  Pharmacy Consult for Heparin Indication: DVT  (on Xarelto PTA)  Allergies  Allergen Reactions   Baclofen Other (See Comments)    Altered mental status requiring a 3-day hospital stay- patient became unresponsive   Dust Mite Extract Shortness Of Breath and Other (See Comments)    "sneezing" (02/20/2012)   Molds & Smuts Shortness Of Breath   Morphine And Related Hives and Itching   Other Shortness Of Breath and Other (See Comments)    Grass and weeds "sneezing; filled sinuses" (02/20/2012)   Penicillins Rash and Other (See Comments)    "welts" (02/20/2012) Tolerated Ancef 02/16/20.    Rofecoxib Swelling and Other (See Comments)    Vioxx- feet swelling    Shellfish Allergy Anaphylaxis, Shortness Of Breath, Itching and Rash   Shrimp Flavor Anaphylaxis and Other (See Comments)    ALL SHELLFISH, also   Tetracycline Hcl Nausea And Vomiting   Xolair [Omalizumab] Other (See Comments)    Caused Blood clot   Zoledronic Acid Other (See Comments)    Fever, Put in hospital, dr said it was a reaction from a reaction  Other reaction(s): Unknown   Dilaudid [Hydromorphone Hcl] Itching   Levofloxacin Other (See Comments)    GI upset   Oxycodone Hcl Nausea And Vomiting   Paroxetine Nausea And Vomiting and Other (See Comments)    Paxil    Celecoxib Swelling and Other (See Comments)    Feet swelling   Dilaudid [Hydromorphone] Other (See Comments)    "Allergic," per [facility]   Diltiazem Swelling   Diltiazem Hcl Swelling   Hydrocodone-Acetaminophen Nausea And Vomiting and Other (See Comments)    "Allergic," per [facility]   Lactose Intolerance (Gi) Other (See Comments)    Bloating and gas   Morphine Sulfate Other (See Comments)    "Gets loopy"   Oxycodone-Acetaminophen Other (See Comments)    Reaction??   Strawberry Flavor Other (See Comments)    Reaction?   Tree Extract Other (See Comments)    "tested and told I was allergic to it; never experienced a  reaction to it" (02/20/2012)   Penicillin G Procaine Rash and Other (See Comments)    Welts, also    Patient Measurements:   Heparin Dosing Weight: 60 kg  Vital Signs: Temp: 97.6 F (36.4 C) (08/16 1423) Temp Source: Oral (08/16 1423) BP: 103/58 (08/17 0000) Pulse Rate: 59 (08/17 0000)  Labs: Recent Labs    10/04/20 1455 10/04/20 2257  HGB 11.3*  --   HCT 35.8*  --   PLT 372  --   LABPROT  --  19.1*  INR  --  1.6*  CREATININE 4.07* 3.23*    Estimated Creatinine Clearance: 13.5 mL/min (A) (by C-G formula based on SCr of 3.23 mg/dL (H)).   Medical History: Past Medical History:  Diagnosis Date   Abscess of dorsum of right hand 09/22/2020   Acute deep vein thrombosis (DVT) of right lower extremity ([place]) 12/13/2017   Allergic rhinitis    Anemia    Angio-edema    Anxiety    pt denies   Arthritis    [employer]reesia 10/21/2019   Asthma    [employer] 10/21/2019   Breast cancer ([place]) 1998   in remission   Cataract    REMOVED   Clostridium difficile colitis 01/14/2018   Clostridium difficile diarrhea [id number]   Complication of anesthesia    "had hard time waking up from it several times" (02/20/2012)   Depression    "  some; don't take anything for it" (02/20/2012)   Diverticulosis    DVT (deep venous thrombosis) ([place])    Fibromyalgia 11/2011   GERD (gastroesophageal reflux disease)    Graves disease    Headache(784.0)    "related to allergies; more at different times during the year" (02/20/2012)   Hemorrhoids    Hiatal hernia    back and neck   Hx of adenomatous colonic polyps 04/12/2016   Hypercholesteremia    good cholesterol is high   Hypothyroidism    IBS (irritable bowel syndrome)    Moderate persistent asthma    -FeV1 72% 2011, -IgE 102 2011, CT sinus Neg 2011   Osteoporosis    on reclast yearly   Peripheral vascular disease ([place]) 2019   DVTs   Pneumonia 04/2011; ~ 11/2011   "double; single" (02/20/2012)   Septic olecranon bursitis of right elbow 09/22/2020    Seronegative rheumatoid arthritis ([place])    Dr. Lahoma Rocker   SIRS (systemic inflammatory response syndrome) ([place]) 02/10/2018   Tracheobronchomalacia     Medications:  PTA Xarelto '20mg'$  daily in the evening  Assessment: 70 yr female with h/o DVT and on Xarelto PTA admitted to hospital for acute renal failure CrCl < 15 ml/min so IV heparin to be started for anticoagulation while Xarelto is currently held Xarelto's effects will falsely elevate heparin level, so will monitor heparin gtt with aPTT until effects of Xarelto have worn off and the aPTT and HL correlate  Goal of Therapy:  Heparin level 0.3-0.7 units/ml aPTT 66-102 seconds Monitor platelets by anticoagulation protocol: Yes   Plan:  Add on baseline aPTT and heparin level to the PT/INR that was drawn 8/16 pm Begin IV heparin gtt @ 950 units/hr Check aPTT and HL 8 hr after heparin started Daily CBC and heparin level  Sirenia Whitis, Toribio Harbour, PharmD 10/05/2020,12:31 AM

## 2020-10-05 NOTE — Consult Note (Addendum)
Cardiology Consultation:   Patient ID: Ebony Scott MRN: [id number]; DOB: 07-26-1950  Admit date: 10/04/2020 Date of Consult: 10/05/2020  PCP:  [name], MD   [facility] Providers Cardiologist:  Buford Dresser, MD   {  Patient Profile:   Ebony Scott is a 70 y.o. 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 Patient was recently admitted for septic right elbow olecranon bursitis and cellulitis of right hand. Underwent I&D on 09/22/2020. Cardiology was consulted for further evaluation of newly cardiomyopathy and she was started on GDMT (Entresto and Bisoprolol). Discharged on 09/30/2020. Readmitted on 10/04/2020 for AKI with creatinine of 4.07. Cardiology consulted to assist with medication adjustments at the request of Dr. Darrick Meigs.  History of Present Illness:   Ebony Scott is a 70 year old female with the above history who is followed by Dr. Harrell Gave. Patient was recently admitted from 09/22/2020 to 09/30/2020 with septic right elbow olecranon bursitis and cellulitis of right hand. She underwent I&D on 09/22/2020. Recent Echo prior to this visit showed LVEF of 35-40% with septal apical and anterior wall hypokinesis which are new. EF previously >75% in 2021. Therefore, Cardiology was consulted for further evaluation. She was initially diuresed with IV Lasix but this was stopped on 8/8 due to rise in creatinine. She was started on GDMT with Entresto and Bisoprolol. She was briefly started on Farxiga but this was stopped due to active fungal infection. Plan was for outpatient ischemic evaluation after she recovered for acute infection. Patient was discharged to SNF on 09/30/2020.  Patient presented back  to the [facility] ED on 10/04/2020 for acute renal failure after labs at [facility] showed creatinine > 4.0. In the ED, BP soft as low as 83/50. EKG showed sinus bradycardia, rate 55 bpm, with abnormal T waves in leads I and aVL. BNP elevated at 1,083 (424 on 09/22/2020). Chest x-ray showed no acute findings. WBC 15.8, Hgb 11.3, Plts 372. Na 131, K 6.0, Glucose 345, BUN 127, Cr 4.07. AST 98, ALT 132, Alk Phos 268, Total Bili 0.5. Magnesium 3.1. Renal CT showed no acute intraabdominal or intrapelvic abnormality but atrophic scarred kidneys. Patient was given Lokelma and IV fluids and admitted.  At the time of this evaluation, patient resting comfortably in no acute distress. She states she actually felt like she was getting stronger at the SNF. She still note significant weakness/fatigue and chronic dyspnea at rest and with activity but this is unchanged from her baseline. She looks like she is feeling better and sounds more energetic compared to when I saw her last week. No chest pain, orthopnea, PND, edema, palpitations, lightheadedness, dizziness, syncope. No fevers or illnesses. No GI symptoms. No abnormal bleeding in urine stools. She states she has been eating and drinking OK since discharge.    Past Medical History:  Diagnosis Date   Abscess of dorsum of right hand 09/22/2020   Acute deep vein thrombosis (DVT) of right lower extremity ([place]) 12/13/2017   Allergic rhinitis    Anemia    Angio-edema    Anxiety    pt denies   Arthritis    Phreesia 10/21/2019   Asthma    Phreesia 10/21/2019   Breast cancer ([place]) 1998   in remission   Cataract  REMOVED   Clostridium difficile colitis 01/14/2018   Clostridium difficile diarrhea 61/95/0932   Complication of anesthesia    "had hard time waking up from it several times" (02/20/2012)   Depression    "some; don't take anything for it" (02/20/2012)   Diverticulosis    DVT (deep venous thrombosis) ([place])    Fibromyalgia 11/2011   GERD (gastroesophageal  reflux disease)    Graves disease    Headache(784.0)    "related to allergies; more at different times during the year" (02/20/2012)   Hemorrhoids    Hiatal hernia    back and neck   Hx of adenomatous colonic polyps 04/12/2016   Hypercholesteremia    good cholesterol is high   Hypothyroidism    IBS (irritable bowel syndrome)    Moderate persistent asthma    -FeV1 72% 2011, -IgE 102 2011, CT sinus Neg 2011   Osteoporosis    on reclast yearly   Peripheral vascular disease ([place]) 2019   DVTs   Pneumonia 04/2011; ~ 11/2011   "double; single" (02/20/2012)   Septic olecranon bursitis of right elbow 09/22/2020   Seronegative rheumatoid arthritis ([place])    Dr. Lahoma Rocker   SIRS (systemic inflammatory response syndrome) ([place]) 02/10/2018   Tracheobronchomalacia     Past Surgical History:  Procedure Laterality Date   ABDOMINAL HYSTERECTOMY N/A    Phreesia 10/21/2019   ANTERIOR AND POSTERIOR REPAIR  1990's   APPENDECTOMY     BREAST LUMPECTOMY  1998   left   BREAST SURGERY N/A    Phreesia 10/21/2019   BRONCHIAL WASHINGS  04/05/2020   Procedure: BRONCHIAL WASHINGS;  Surgeon: Freddi Starr, MD;  Location: [facility];  Service: Pulmonary;;   CARPOMETACARPEL ([place]) FUSION OF THUMB WITH AUTOGRAFT FROM RADIUS  ~ 2009   "both thumbs" (02/20/2012)   CATARACT EXTRACTION W/ INTRAOCULAR LENS  IMPLANT, BILATERAL  2012   CERVICAL DISCECTOMY  10/2001   C5-C6   CERVICAL FUSION  2003   C3-C4   CHOLECYSTECTOMY     COLONOSCOPY     DEBRIDEMENT TENNIS ELBOW  ?1970's   right   ESOPHAGOGASTRODUODENOSCOPY     EYE SURGERY N/A    Phreesia 10/21/2019   HYSTERECTOMY     I & D EXTREMITY Right 09/22/2020   Procedure: IRRIGATION AND DEBRIDEMENT RIGHT HAND AND ELBOW;  Surgeon: Marchia Bond, MD;  Location: [facility];  Service: Orthopedics;  Laterality: Right;   KNEE ARTHROPLASTY  ?1990's   "?right; w/cartilage repair" (02/20/2012)   NASAL SEPTUM SURGERY  1980's   POSTERIOR CERVICAL FUSION/FORAMINOTOMY  2004    "failed initial fusion; rewired  anterior neck" (02/20/2012)   REVERSE SHOULDER ARTHROPLASTY Right 02/16/2020   Procedure: REVERSE SHOULDER ARTHROPLASTY;  Surgeon: Marchia Bond, MD;  Location: [facility];  Service: Orthopedics;  Laterality: Right;   SPINE SURGERY N/A    Phreesia 10/21/2019   TONSILLECTOMY  ~ 1953   VESICOVAGINAL FISTULA CLOSURE W/ TAH  1988   VIDEO BRONCHOSCOPY Bilateral 08/23/2016   Procedure: VIDEO BRONCHOSCOPY WITH FLUORO;  Surgeon: Javier Glazier, MD;  [facility]: [facility];  Service: Cardiopulmonary;  Laterality: Bilateral;   VIDEO BRONCHOSCOPY N/A 04/05/2020   Procedure: VIDEO BRONCHOSCOPY WITHOUT FLUORO;  Surgeon: Freddi Starr, MD;  [facility];  Service: Pulmonary;  Laterality: N/A;     Home Medications:  Prior to Admission medications   Medication Sig Start Date End Date Taking? Authorizing Provider  allopurinol (ZYLOPRIM) 100 MG tablet Take 100 mg by mouth daily. 11/19/19  Yes [provider]  atorvastatin (LIPITOR) 10 MG tablet TAKE 1 TABLET BY MOUTH DAILY Patient taking differently: Take 10 mg by mouth daily. 03/16/20  Yes [name], MD  azelastine (ASTELIN) 0.1 % nasal spray USE 2 SPRAYS IN EACH NOSTRIL DAILY AS DIRECTED 01/02/19  Yes Kozlow, Donnamarie Poag, MD  bisoprolol (ZEBETA) 5 MG tablet Take 0.5 tablets (2.5 mg total) by mouth daily. 09/30/20  Yes Charlynne Cousins, MD  Blood Glucose Monitoring Suppl (BLOOD GLUCOSE SYSTEM PAK) KIT Please dispense per patient and insurance preference. Use as directed to monitor FSBS 4x . Dx: E11.65 09/08/20  Yes [name], MD  cetirizine (ZYRTEC) 10 MG tablet Take 10 mg by mouth daily.   Yes [provider]  cholecalciferol (VITAMIN D) 25 MCG (1000 UT) tablet Take 1,000 Units by mouth daily.    Yes [provider]  Continuous Blood Gluc Receiver (FREESTYLE LIBRE 14 DAY READER) DEVI Apply 1 each topically daily as needed. USE AS DIRECTED TO MONITOR BLOOD GLUCOSE LEVELS. DX:  E11.9. 09/16/20  Yes [name], MD  Continuous Blood Gluc Sensor (FREESTYLE LIBRE 14 DAY SENSOR) MISC Apply 1 each topically every 14 (fourteen) days. USE AS DIRECTED TO MONITOR BLOOD GLUCOSE LEVELS. DX: E11.9. 09/16/20  Yes [name], MD  cyproheptadine (PERIACTIN) 4 MG tablet TAKE 2 TABLETS BY MOUTH EVERY EVENING Patient taking differently: Take 8 mg by mouth every evening. 02/02/20  Yes [name], MD  DEXILANT 60 MG capsule TAKE 1 CAPSULE BY MOUTH EACH MORNING 02/02/20  Yes [name], MD  diclofenac Sodium (VOLTAREN) 1 % GEL Apply 2 g topically 4 (four) times daily as needed (joint pain). 05/26/20  Yes [name], MD  EPINEPHrine (EPIPEN 2-PAK) 0.3 mg/0.3 mL IJ SOAJ injection Inject 0.3 mLs (0.3 mg total) into the muscle Once PRN. 01/23/19  Yes [name], MD  famotidine (PEPCID) 20 MG tablet TAKE 1 TABLET BY MOUTH TWICE A DAY Patient taking differently: Take 20 mg by mouth at bedtime. 02/01/20  Yes Kozlow, Donnamarie Poag, MD  fluconazole (DIFLUCAN) 200 MG tablet Take 1 tablet (200 mg total) by mouth daily for 21 days. 09/30/20 10/21/20 Yes Charlynne Cousins, MD  fluticasone (FLONASE) 50 MCG/ACT nasal spray USE 2 SPRAYS IN EACH NOSTRIL DAILY Patient taking differently: Place 2 sprays into both nostrils daily. 09/02/20  Yes Freddi Starr, MD  folic acid (FOLVITE) 1 MG tablet Take 1 mg by mouth daily.   Yes [provider]  furosemide (LASIX) 40 MG tablet Take 1 tablet (40 mg total) by mouth daily. 80 mg in the AM and 40 mg in the PM 10/02/20  Yes Charlynne Cousins, MD  gabapentin (NEURONTIN) 600 MG tablet TAKE 1 TABLET BY MOUTH DAILY Patient taking differently: Take 600 mg by mouth at bedtime. 02/03/20  Yes [name], MD  Glucose Blood (BLOOD GLUCOSE TEST STRIPS) STRP Please dispense per patient and insurance preference. Use as directed to monitor FSBS 4x . Dx: E11.65 09/08/20  Yes [name], MD  guaiFENesin (MUCINEX) 600 MG 12 hr  tablet Take 1 tablet (600 mg total) by mouth 2 (two) times daily as needed for cough or to loosen phlegm. 05/09/20  Yes [name], MD  magnesium oxide (MAG-OX) 400 MG tablet Take 400 mg by mouth daily.   Yes [provider]  meclizine (ANTIVERT) 25 MG tablet Take 25 mg by mouth 3 (three) times daily as needed for dizziness.   Yes  [provider]  mirtazapine (REMERON SOL-TAB) 30 MG disintegrating tablet DISSOLVE 1 TABLET BY MOUTH EVERY NIGHT AT BEDTIME 05/26/20  Yes [name], Cammie Mcgee, MD  MITIGARE 0.6 MG CAPS Take 0.6 mg by mouth every other day. 01/16/20  Yes [provider]  montelukast (SINGULAIR) 10 MG tablet TAKE 1 TABLET BY MOUTH DAILY 09/05/20  Yes Kozlow, Donnamarie Poag, MD  predniSONE (DELTASONE) 1 MG tablet Take 12 tablets (12 mg total) by mouth daily with breakfast. 09/30/20  Yes Charlynne Cousins, MD  Propylene Glycol (SYSTANE COMPLETE) 0.6 % SOLN Place 1 drop into both eyes 2 (two) times daily.   Yes [provider]  sacubitril-valsartan (ENTRESTO) 49-51 MG Take 1 tablet by mouth 2 (two) times daily. 09/30/20  Yes Charlynne Cousins, MD  tiotropium (SPIRIVA) 18 MCG inhalation capsule Place 18 mcg into inhaler and inhale daily.   Yes [provider]  Tiotropium Bromide-Olodaterol (STIOLTO RESPIMAT) 2.5-2.5 MCG/ACT AERS Inhale 2 puffs into the lungs daily. 08/05/20  Yes Freddi Starr, MD  traMADol (ULTRAM) 50 MG tablet Take 50 mg by mouth at bedtime.   Yes [provider]  XARELTO 20 MG TABS tablet TAKE 1 TABLET BY MOUTH DAILY WITH SUPPER Patient taking differently: Take 20 mg by mouth every evening. 02/02/20  Yes [name], MD  metolazone (ZAROXOLYN) 5 MG tablet Take 1 tablet (5 mg total) by mouth daily. Patient not taking: No sig reported 08/25/20 09/23/20  [name], MD  potassium chloride SA (KLOR-CON) 20 MEQ tablet Take 1 tablet (20 mEq total) by mouth daily. Patient not taking: Reported on 09/23/2020 08/30/20 09/23/20   [name], MD    Inpatient Medications: Scheduled Meds:  allopurinol  100 mg Oral Daily   arformoterol  15 mcg Nebulization BID   And   umeclidinium bromide  1 puff Inhalation Daily   atorvastatin  10 mg Oral Daily   bisoprolol  2.5 mg Oral Daily   colchicine  0.6 mg Oral QODAY   cyproheptadine  8 mg Oral QPM   denosumab  60 mg Subcutaneous Q6 months   fluconazole  200 mg Oral Daily   folic acid  1 mg Oral Daily   gabapentin  300 mg Oral QHS   hydrocortisone sod succinate (SOLU-CORTEF) inj  50 mg Intravenous Q8H   loratadine  10 mg Oral Daily   magnesium oxide  400 mg Oral Daily   mirtazapine  30 mg Oral QHS   montelukast  10 mg Oral Daily   pantoprazole  40 mg Oral Daily   Continuous Infusions:  sodium chloride 125 mL/hr at 10/05/20 0450   heparin 950 Units/hr (10/05/20 0233)   PRN Meds: acetaminophen **OR** acetaminophen, albuterol  Allergies:    Allergies  Allergen Reactions   Baclofen Other (See Comments)    Altered mental status requiring a 3-day hospital stay- patient became unresponsive   Dust Mite Extract Shortness Of Breath and Other (See Comments)    "sneezing" (02/20/2012)   Molds & Smuts Shortness Of Breath   Morphine And Related Hives and Itching   Other Shortness Of Breath and Other (See Comments)    Grass and weeds "sneezing; filled sinuses" (02/20/2012)   Penicillins Rash and Other (See Comments)    "welts" (02/20/2012) Tolerated Ancef 02/16/20.    Rofecoxib Swelling and Other (See Comments)    Vioxx- feet swelling    Shellfish Allergy Anaphylaxis, Shortness Of Breath, Itching and Rash   Shrimp Flavor Anaphylaxis and Other (See Comments)  ALL SHELLFISH, also   Tetracycline Hcl Nausea And Vomiting   Xolair [Omalizumab] Other (See Comments)    Caused Blood clot   Zoledronic Acid Other (See Comments)    Fever, Put in hospital, dr said it was a reaction from a reaction  Other reaction(s): Unknown   Dilaudid [Hydromorphone Hcl] Itching    Levofloxacin Other (See Comments)    GI upset   Oxycodone Hcl Nausea And Vomiting   Paroxetine Nausea And Vomiting and Other (See Comments)    Paxil    Celecoxib Swelling and Other (See Comments)    Feet swelling   Dilaudid [Hydromorphone] Other (See Comments)    "Allergic," per [facility]   Diltiazem Swelling   Diltiazem Hcl Swelling   Hydrocodone-Acetaminophen Nausea And Vomiting and Other (See Comments)    "Allergic," per [facility]   Lactose Intolerance (Gi) Other (See Comments)    Bloating and gas   Morphine Sulfate Other (See Comments)    "Gets loopy"   Oxycodone-Acetaminophen Other (See Comments)    Reaction??   Strawberry Flavor Other (See Comments)    Reaction?   Tree Extract Other (See Comments)    "tested and told I was allergic to it; never experienced a reaction to it" (02/20/2012)   Penicillin G Procaine Rash and Other (See Comments)    Welts, also    Social History:   Social History   Socioeconomic History   Marital status: Divorced    Spouse name: Not on file   Number of children: 2   Years of education: college   Highest education level: Not on file  Occupational History   Occupation: Disabled    Comment: Retired Dispensing optician: RETIRED  Tobacco Use   Smoking status: Never    Passive exposure: Yes   Smokeless tobacco: Never   Tobacco comments:    Parents  Vaping Use   Vaping Use: Never used  Substance and Sexual Activity   Alcohol use: Not Currently    Alcohol/week: 0.0 standard drinks   Drug use: No   Sexual activity: Never  Other Topics Concern   Not on file  Social History Narrative   Patient lives at home alone. Patient  divorced.    Patient has her BS degree.   Right handed.   Caffeine- sometimes coffee.      [place] [facility]:   Born in [place], [place]. She worked as a Armed forces operational officer. She has no pets currently. She does have indoor plants. Previously had mold in her home that was remediated. Carpet was removed.           Social Determinants of Health   Financial Resource Strain: Low Risk    Difficulty of Paying Living Expenses: Not hard at all  Food Insecurity: No Food Insecurity   Worried About Programme researcher, broadcasting/film/video in the Last Year: Never true   Ran Out of Food in the Last Year: Never true  Transportation Needs: No Transportation Needs   Lack of Transportation (Medical): No   Lack of Transportation (Non-Medical): No  Physical Activity: Inactive   Days of Exercise per Week: 0 days   Minutes of Exercise per Session: 0 min  Stress: No Stress Concern Present   Feeling of Stress : Not at all  Social Connections: Moderately Isolated   Frequency of Communication with Friends and Family: More than three times a week   Frequency of Social Gatherings with Friends and Family: More than three times a week  Attends Religious Services: Never   Active Member of Clubs or Organizations: Yes   Attends Archivist Meetings: Never   Marital Status: Divorced  Human resources officer Violence: Not At Risk   Fear of Current or Ex-Partner: No   Emotionally Abused: No   Physically Abused: No   Sexually Abused: No    Family History:   Family History  Problem Relation Age of Onset   Allergies Mother    Heart disease Mother    Arthritis Mother    Lung cancer Mother    Diabetes Mother    Allergies Father    Heart disease Father    Arthritis Father    Stroke Father    Colon cancer Other        Maternal half aunt/Maternal half uncle   Colitis Daughter    Diabetes Maternal Grandfather      ROS:  Please see the history of present illness.  Review of Systems  Constitutional:  Negative for fever.  HENT:  Negative for congestion.   Respiratory:  Positive for cough (occasionally), sputum production (white/yellow phlegm) and shortness of breath. Negative for hemoptysis.   Cardiovascular:  Negative for chest pain, palpitations, orthopnea, leg swelling and PND.  Gastrointestinal:  Negative for blood in stool,  melena, nausea and vomiting.  Genitourinary:  Negative for hematuria.  Musculoskeletal:  Negative for myalgias.  Neurological:  Positive for weakness. Negative for dizziness and loss of consciousness.  Endo/Heme/Allergies:  Does not bruise/bleed easily.  Psychiatric/Behavioral:  Negative for substance abuse.   All other systems reviewed and are negative.   Physical Exam/Data:   Vitals:   10/05/20 0400 10/05/20 0545 10/05/20 0700 10/05/20 0900  BP: (!) 98/54 (!) 112/57 (!) 100/53 (!) 119/58  Pulse: (!) 58 (!) 58 (!) 52 64  Resp: 19 (!) 21  (!) 23  Temp:      TempSrc:      SpO2: 90% 96% 95% 98%    Intake/Output Summary (Last 24 hours) at 10/05/2020 1129 Last data filed at 10/05/2020 0449 Gross per 24 hour  Intake 2000 ml  Output --  Net 2000 ml   Last 3 Weights 09/30/2020 09/22/2020 09/06/2020  Weight (lbs) 141 lb 12.1 oz 140 lb 143 lb  Weight (kg) 64.3 kg 63.504 kg 64.864 kg     There is no height or weight on file to calculate BMI.  General: 70 y.o. female resting comfortably in no acute distress. HEENT: Normocephalic and atraumatic. Sclera clear.  Neck: Supple. No JVD. Heart: Bradycardic with normal rhythm. No murmurs, gallops, or rubs.  Lungs: No increased work of breathing. Faint course crackles in bases that improved with coughing. No wheezes.  Abdomen: Soft, non-distended, and non-tender to palpation. Bowel sounds present. MSK: Normal strength and tone for age. Extremities: No lower extremity edema. Right arm and right ankle/foot wrapped in ACE bandage.   Skin: Warm and dry. Neuro: Alert and oriented x3. No focal deficits. Psych: Normal affect. Responds appropriately.  EKG:  The EKG was personally reviewed and demonstrates: Sinus bradycardia, rate 55 bpm, with abnormal T waves in leads I and aVL. No significant changes from prior tracing.  Telemetry:  Telemetry was personally reviewed and demonstrates:  Sinus rhythm with rates in the 50s to 60s.  Relevant CV  Studies:  Echocardiogram 09/16/2020:  1. Septal apical and anterior wall hypokinesis EF decreased compard to  TTE done 07/10/19 . Left ventricular ejection fraction, by estimation, is  35 to 40%. The left ventricle has moderately decreased function.  The left  ventricle demonstrates regional  wall motion abnormalities (see scoring diagram/findings for description).  The left ventricular internal cavity size was moderately dilated. Left  ventricular diastolic parameters are consistent with Grade II diastolic  dysfunction (pseudonormalization).  Elevated left ventricular end-diastolic pressure.   2. Right ventricular systolic function is normal. The right ventricular  size is normal.   3. Left atrial size was mildly dilated.   4. The mitral valve is abnormal. Mild mitral valve regurgitation. No  evidence of mitral stenosis.   5. The aortic valve is tricuspid. There is moderate calcification of the  aortic valve. Aortic valve regurgitation is not visualized. Mild to  moderate aortic valve sclerosis/calcification is present, without any  evidence of aortic stenosis.   6. The inferior vena cava is normal in size with greater than 50%  respiratory variability, suggesting right atrial pressure of 3 mmHg.  Laboratory Data:  High Sensitivity Troponin:  No results for input(s): TROPONINIHS in the last 720 hours.   Chemistry Recent Labs  Lab 10/04/20 1455 10/04/20 2257 10/05/20 0447  NA 131* 136 141  K 6.0* 5.8* 4.9  CL 97* 103 110  CO2 21* 21* 21*  GLUCOSE 345* 274* 160*  BUN 127* 89* 106*  CREATININE 4.07* 3.23* 2.82*  CALCIUM 7.7* 7.4* 7.3*  GFRNONAA 11* 15* 17*  ANIONGAP $RemoveB'13 12 10    'WPJsLpHy$ Recent Labs  Lab 10/04/20 1455  PROT 6.6  ALBUMIN 2.6*  AST 98*  ALT 132*  ALKPHOS 268*  BILITOT 0.5   Hematology Recent Labs  Lab 10/04/20 1455 10/05/20 0447  WBC 15.8* 15.8*  RBC 3.44* 3.09*  HGB 11.3* 10.0*  HCT 35.8* 32.1*  MCV 104.1* 103.9*  MCH 32.8 32.4  MCHC 31.6 31.2  RDW  16.3* 16.4*  PLT 372 374   BNP Recent Labs  Lab 10/04/20 1455  BNP 1,083.7*    DDimer No results for input(s): DDIMER in the last 168 hours.   Radiology/Studies:  DG Chest Port 1 View  Result Date: 10/04/2020 CLINICAL DATA:  Possible fluid overload with increased shortness of breath, initial encounter EXAM: PORTABLE CHEST 1 VIEW COMPARISON:  09/22/2020 FINDINGS: Postsurgical changes are again noted. Cardiac shadow is enlarged but stable. Lungs are clear. No significant vascular congestion is noted. No bony abnormality is noted. IMPRESSION: No findings to suggest fluid overload. Electronically Signed   By: Inez Catalina M.D.   On: 10/04/2020 17:18   CT RENAL STONE STUDY  Result Date: 10/04/2020 CLINICAL DATA:  Urinary tract stone, symptomatic/complicated. Elevated creatinine EXAM: CT ABDOMEN AND PELVIS WITHOUT CONTRAST TECHNIQUE: Multidetector CT imaging of the abdomen and pelvis was performed following the standard protocol without IV contrast. COMPARISON:  None. FINDINGS: Lower chest: Coronary artery calcifications.  Bibasilar scarring. Hepatobiliary: No focal liver abnormality. Status post cholecystectomy. No biliary dilatation. Pancreas: No focal lesion. Normal pancreatic contour. No surrounding inflammatory changes. No main pancreatic ductal dilatation. Spleen: Normal in size without focal abnormality. Adrenals/Urinary Tract: No adrenal nodule bilaterally. Bilateral renal cortical scarring and atrophy. No nephrolithiasis and no hydronephrosis. No definite contour-deforming renal mass. No ureterolithiasis or hydroureter. The urinary bladder is unremarkable. Stomach/Bowel: Stomach is within normal limits. No evidence of bowel wall thickening or dilatation. Couple scattered colonic diverticula. Appendix appears normal. Vascular/Lymphatic: No abdominal aorta or iliac aneurysm. Mild atherosclerotic plaque of the aorta and its branches. No abdominal, pelvic, or inguinal lymphadenopathy. Reproductive:  Status post hysterectomy. No adnexal masses. Other: No intraperitoneal free fluid. No intraperitoneal free gas. No organized fluid collection.  Musculoskeletal: Tiny fat containing umbilical hernia. Asymmetrically atrophic right iliopsoas muscle. No suspicious lytic or blastic osseous lesions. No acute displaced fracture. Grade 1 anterolisthesis of L4 on L5 with associated degenerative changes. IMPRESSION: 1. No acute intra-abdominal or intrapelvic abnormality with limited evaluation on this noncontrast study. 2. Couple scattered colon diverticula with no acute diverticulitis. 3. Atrophic scarred kidneys. 4.  Aortic Atherosclerosis (ICD10-I70.0). Electronically Signed   By: Iven Finn M.D.   On: 10/04/2020 19:58     Assessment and Plan:   Chronic Heart Failure with Newly Reduced EF - Echo on 09/16/2020 showed LVEF of 35-40% with septal apical and anterior wall hypokinesis which are new. EF previously >75% in 2021. - BNP elevated at 1,083 (424 on 8/4). - Chest x-ray showed no acute findings. - Appears euvolemic on exam. - Entresto stopped due to AKI.  - Continue Bisoprolol 2.5mg  daily. - No Farxiga for now given recent fungal infection. - Patient will ultimately need an ischemic evaluation  Prior chest CT showed diffuse coronary calcifications. Plan is for outpatient cardiac catheterization once patient recovers from acute infection and if renal function improves.   Hypotensive with History of Hypertension - BP as low as 83/50 upon arrival. Improving with IV fluids.  - Continue Bisoprolol 2.5mg  daily. - Continue to hold Entresto. - Continue IV fluids for now. Will need to monitor volume status closely with this.   Hyperlipidemia - Lipid panel this admission: Total Cholesterol 318, Triglycerides 475, HDL 38. LDL could not be calculated due to Triglyceride level. Direct LDL pending. Last LDL I can see in our system was 132 in 2018. - LDL goal <70 given CAD. - Lipitor increased to 80mg  daily  during last admission but she was discharged on 10mg  daily. LFTs elevated on admission but may be due to mild shock liver given hypotension. OK to continue for now but will need to monitor closely. Will check LFTs tomorrow.  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   Acute on CKD Stage III - Creatinine 4.07 on admission but improving with IV fluids 3.23 >> 2.82. Baseline around 1.3 to 1.6. - Likely pre-renal in nature due to hypotension and Entresto. - CT unremarkable. No obstruction. - Entresto held. - Continue IV fluids per primary. - Continue to monior.   Hyperkalemia - In setting of AKI. - Recevied 2 dose of Lokelma and improved to 4.9.  - Continue to monitor.   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 Dyspnea mainly due to underlying pulmonary disease.  For questions or updates, please contact [place] Please consult [email or web address] for contact info under    Signed, Darreld Mclean, PA-C  10/05/2020 11:29 AM  Personally seen and examined. Agree with above.  70 year old recent hospitalization with systolic heart failure who presented here with acute renal failure/AKI, hyperkalemia, hypotension blood pressure in the 80s, shock liver ALT 132.  Has improved with resuscitation efforts, fluid.  Currently fairly comfortable in bed.  On exam faint diffuse crackles.  Heart is regular, bradycardic.  No murmurs.  Brace on arm for bursitis.  EKG shows sinus bradycardia 55 bpm with T  wave inversion in 1 and aVL.  No change from prior.  Telemetry also  personally reviewed shows heart rate in the 50s and 60s.  Echocardiogram from 09/16/2020 shows EF of 35 to 40%.  When she arrived, sodium was 131 potassium 6.0 BUN 127 creatinine 4.07.  Currently BUN 106 creatinine 2.82 potassium 4.9 after Lokelma.  Assessment and plan:  Chronic systolic heart failure/AKI - Agree with holding Entresto.  We will avoid this medication as this resulted in hypotension as well as decreased intravascular volume. - If medication is required for blood pressure, would advocate for hydralazine for continued afterload reduction. - Continue with bisoprolol low-dose.- Baseline creatinine 1.3-1.6. -May consider holding her colchicine as well in the setting of AKI.  Chronic anticoagulation secondary to history of DVT and PE - As renal function improves, make sure GFR is adequate for Xarelto use.  We will go ahead and sign off.  Please let us know if we can be of further assistance.  Candee Furbish, MD

## 2020-10-06 ENCOUNTER — Inpatient Hospital Stay (HOSPITAL_COMMUNITY): Payer: Medicare PPO

## 2020-10-06 LAB — CBC
HCT: 32.9 % — ABNORMAL LOW (ref 36.0–46.0)
Hemoglobin: 10.2 g/dL — ABNORMAL LOW (ref 12.0–15.0)
MCH: 32.6 pg (ref 26.0–34.0)
MCHC: 31 g/dL (ref 30.0–36.0)
MCV: 105.1 fL — ABNORMAL HIGH (ref 80.0–100.0)
Platelets: 331 10*3/uL (ref 150–400)
RBC: 3.13 MIL/uL — ABNORMAL LOW (ref 3.87–5.11)
RDW: 16.8 % — ABNORMAL HIGH (ref 11.5–15.5)
WBC: 13.3 10*3/uL — ABNORMAL HIGH (ref 4.0–10.5)
nRBC: 0 % (ref 0.0–0.2)

## 2020-10-06 LAB — COMPREHENSIVE METABOLIC PANEL
ALT: 72 U/L — ABNORMAL HIGH (ref 0–44)
AST: 37 U/L (ref 15–41)
Albumin: 2.3 g/dL — ABNORMAL LOW (ref 3.5–5.0)
Alkaline Phosphatase: 191 U/L — ABNORMAL HIGH (ref 38–126)
Anion gap: 12 (ref 5–15)
BUN: 68 mg/dL — ABNORMAL HIGH (ref 8–23)
CO2: 18 mmol/L — ABNORMAL LOW (ref 22–32)
Calcium: 7.7 mg/dL — ABNORMAL LOW (ref 8.9–10.3)
Chloride: 114 mmol/L — ABNORMAL HIGH (ref 98–111)
Creatinine, Ser: 1.74 mg/dL — ABNORMAL HIGH (ref 0.44–1.00)
GFR, Estimated: 31 mL/min — ABNORMAL LOW (ref >60)
Glucose, Bld: 136 mg/dL — ABNORMAL HIGH (ref 70–99)
Potassium: 3.9 mmol/L (ref 3.5–5.1)
Sodium: 144 mmol/L (ref 135–145)
Total Bilirubin: 0.6 mg/dL (ref 0.3–1.2)
Total Protein: 5.4 g/dL — ABNORMAL LOW (ref 6.5–8.1)

## 2020-10-06 LAB — GLUCOSE, CAPILLARY
Glucose-Capillary: 142 mg/dL — ABNORMAL HIGH (ref 70–99)
Glucose-Capillary: 241 mg/dL — ABNORMAL HIGH (ref 70–99)
Glucose-Capillary: 144 mg/dL — ABNORMAL HIGH (ref 70–99)
Glucose-Capillary: 132 mg/dL — ABNORMAL HIGH (ref 70–99)

## 2020-10-06 LAB — HEPARIN LEVEL (UNFRACTIONATED): Heparin Unfractionated: >1.1 IU/mL — ABNORMAL HIGH (ref 0.30–0.70)

## 2020-10-06 LAB — APTT: aPTT: 179 seconds (ref 24–36)

## 2020-10-06 MED ORDER — GUAIFENESIN-DM 100-10 MG/5ML PO SYRP
5.0000 mL | ORAL_SOLUTION | Freq: Four times a day (QID) | ORAL | Status: DC | PRN
  Administered 2020-10-06: 5 mL via ORAL
  Filled 2020-10-06 (×2): qty 10

## 2020-10-06 MED ORDER — HEPARIN (PORCINE) 25000 UT/250ML-% IV SOLN
1000.0000 [IU]/h | INTRAVENOUS | Status: DC
  Filled 2020-10-06: qty 250

## 2020-10-06 MED ORDER — RIVAROXABAN 15 MG PO TABS
15.0000 mg | ORAL_TABLET | Freq: Every day | ORAL | Status: DC
  Administered 2020-10-06 – 2020-10-11 (×6): 15 mg via ORAL
  Filled 2020-10-06 (×6): qty 1

## 2020-10-06 MED ORDER — HYDROCORTISONE NA SUCCINATE PF 100 MG IJ SOLR
25.0000 mg | Freq: Two times a day (BID) | INTRAMUSCULAR | Status: DC
  Administered 2020-10-06 – 2020-10-07 (×2): 25 mg via INTRAVENOUS
  Filled 2020-10-06 (×3): qty 2

## 2020-10-06 MED ORDER — ATORVASTATIN CALCIUM 40 MG PO TABS
80.0000 mg | ORAL_TABLET | Freq: Every day | ORAL | Status: DC
  Administered 2020-10-06 – 2020-10-11 (×6): 80 mg via ORAL
  Filled 2020-10-06 (×6): qty 2

## 2020-10-06 NOTE — Progress Notes (Signed)
[facility]  PROGRESS NOTE  Ebony Scott [id number] DOB: 06/03/1950 DOA: 10/04/2020 PCP: [name], MD   Brief HPI:   70 year old female with history of diffuse aorta and coronary calcifications noted on chest CT on 07/2017, chronic combined CHF with a EF of 35 to 40% on recent echo, SVT, prior PE on lifelong anticoagulation, asthma, tracheobronchomalacia on CPAP, bronchiectasis, vocal cord dysfunction, seronegative rheumatoid arthritis, CKD stage III who was recently mated to the hospital for septic right elbow olecranon bursitis and cellulitis of right hand.  She underwent incision and drainage on 09/22/2020.  Cardiology was consulted, and she was started on Entresto and bisoprolol.  Discharged on 09/30/2020.  Patient was readmitted with AKI, creatinine of 4.07.  Her creatinine was 1.8 on discharge on 11th August.    Subjective   Patient seen and examined, renal function has improved with IV normal saline.  Today creatinine is 1.74.   Assessment/Plan:    Acute on chronic kidney disease stage III -Baseline creatinine around 1.6-1.8 -Presented with creatinine of 4.07; likely from Entresto and hypotension -[name] is currently on hold -Started on IV normal saline; creatinine improved to 1.74 today -At baseline; will hold colchicine at this time -Follow BMP in am  Chronic combined systolic and diastolic CHF -Last EF was 35 to 40% in July 2022 -Entresto and bisoprolol were held -Cardiology has restarted bisoprolol, Entresto is still on hold   Diabetes mellitus type 2 -Hemoglobin A1c was 8.1, 2 weeks ago -CBGs elevated due to steroids -We will start weaning off steroids at this time -Start sliding scale insulin with NovoLog  History of rheumatoid arthritis -Patient is on prednisone and Arava at home -Started on Solu-Cortef stress dose 50 mg every 8 hours -We will start weaning of Solu-Cortef  History of DVT/PE -Patient is on Xarelto at home -Started on heparin  per pharmacy -Xarelto has been restarted  Recent septic olecranon bursitis -S/p debridement during last admission -Culture grew Candida albicans; started on fluconazole till October 20, 2020   Scheduled medications:    allopurinol  100 mg Oral Daily   arformoterol  15 mcg Nebulization BID   And   umeclidinium bromide  1 puff Inhalation Daily   atorvastatin  80 mg Oral Daily   bisoprolol  2.5 mg Oral Daily   colchicine  0.6 mg Oral QODAY   cyproheptadine  8 mg Oral QPM   fluconazole  200 mg Oral Daily   folic acid  1 mg Oral Daily   gabapentin  300 mg Oral QHS   hydrocortisone sod succinate (SOLU-CORTEF) inj  25 mg Intravenous Q8H   insulin aspart  0-9 Units Subcutaneous TID WC   loratadine  10 mg Oral Daily   magnesium oxide  400 mg Oral Daily   mirtazapine  30 mg Oral QHS   montelukast  10 mg Oral Daily   pantoprazole  40 mg Oral Daily   rivaroxaban  15 mg Oral Daily         Data Reviewed:   CBG:  Recent Labs  Lab 09/30/20 0731 09/30/20 1228 10/05/20 1655 10/06/20 0719 10/06/20 1211  GLUCAP 83 198* 211* 142* 241*    SpO2: 95 %    Vitals:   10/06/20 0500 10/06/20 0510 10/06/20 0837 10/06/20 1229  BP:  120/63  130/63  Pulse:  (!) 54  61  Resp:    16  Temp:  98.2 F (36.8 C)  98.1 F (36.7 C)  TempSrc:  Oral    SpO2:  96% 96% 95%  Weight: 60.4 kg        Intake/Output Summary (Last 24 hours) at 10/06/2020 1501 Last data filed at 10/05/2020 2359 Gross per 24 hour  Intake 1508.14 ml  Output 650 ml  Net 858.14 ml    08/16 1901 - 08/18 0700 In: 3508.1 [I.V.:2508.1] Out: 650 [Urine:650]  Filed Weights   10/06/20 0500  Weight: 60.4 kg    CBC:  Recent Labs  Lab 10/04/20 1455 10/05/20 0447 10/06/20 0640  WBC 15.8* 15.8* 13.3*  HGB 11.3* 10.0* 10.2*  HCT 35.8* 32.1* 32.9*  PLT 372 374 331  MCV 104.1* 103.9* 105.1*  MCH 32.8 32.4 32.6  MCHC 31.6 31.2 31.0  RDW 16.3* 16.4* 16.8*  LYMPHSABS 0.5*  --   --   MONOABS 1.0  --   --    EOSABS 0.0  --   --   BASOSABS 0.1  --   --     Complete metabolic panel:  Recent Labs  Lab 10/04/20 1455 10/04/20 2257 10/05/20 0447 10/06/20 0640  NA 131* 136 141 144  K 6.0* 5.8* 4.9 3.9  CL 97* 103 110 114*  CO2 21* 21* 21* 18*  GLUCOSE 345* 274* 160* 136*  BUN 127* 89* 106* 68*  CREATININE 4.07* 3.23* 2.82* 1.74*  CALCIUM 7.7* 7.4* 7.3* 7.7*  AST 98*  --   --  37  ALT 132*  --   --  72*  ALKPHOS 268*  --   --  191*  BILITOT 0.5  --   --  0.6  ALBUMIN 2.6*  --   --  2.3*  MG 3.1*  --   --   --   INR  --  1.6*  --   --   BNP 1,083.7*  --   --   --     No results for input(s): LIPASE, AMYLASE in the last 168 hours.  Recent Labs  Lab 09/30/20 1020 10/04/20 1455 10/04/20 1724  BNP  --  1,083.7*  --   SARSCOV2NAA NEGATIVE  --  NEGATIVE    ------------------------------------------------------------------------------------------------------------------ No results for input(s): CHOL, HDL, LDLCALC, TRIG, CHOLHDL, LDLDIRECT in the last 72 hours.  Lab Results  Component Value Date   HGBA1C 8.1 (H) 09/22/2020   ------------------------------------------------------------------------------------------------------------------ No results for input(s): TSH, T4TOTAL, T3FREE, THYROIDAB in the last 72 hours.  Invalid input(s): FREET3 ------------------------------------------------------------------------------------------------------------------ No results for input(s): VITAMINB12, FOLATE, FERRITIN, TIBC, IRON, RETICCTPCT in the last 72 hours.  Coagulation profile Recent Labs  Lab 10/04/20 2257  INR 1.6*   No results for input(s): DDIMER in the last 72 hours.  Cardiac Enzymes No results for input(s): CKTOTAL, CKMB, CKMBINDEX, TROPONINI in the last 168 hours.  ------------------------------------------------------------------------------------------------------------------    Component Value Date/Time   BNP 1,083.7 (H) 10/04/2020 1455   BNP 494 (H) 08/25/2020  1435     Antibiotics: Anti-infectives (From admission, onward)    Start     Dose/Rate Route Frequency Ordered Stop   10/05/20 1000  fluconazole (DIFLUCAN) tablet 200 mg        200 mg Oral Daily 10/04/20 2106          Radiology Reports  DG Chest 2 View  Result Date: 10/06/2020 CLINICAL DATA:  Acute renal failure, shortness of breath EXAM: CHEST - 2 VIEW COMPARISON:  10/04/2020 FINDINGS: Mild enlargement of cardiac silhouette unchanged. Mediastinal contours and pulmonary vascularity normal. Slight chronic accentuation of interstitial markings. No acute infiltrate, pleural effusion or pneumothorax. Bones demineralized with postsurgical changes of  cervical spine fusion and reverse RIGHT shoulder arthroplasty. IMPRESSION: Enlargement of cardiac silhouette. No acute abnormalities. Electronically Signed   By: Lavonia Dana M.D.   On: 10/06/2020 12:38   DG Chest Port 1 View  Result Date: 10/04/2020 CLINICAL DATA:  Possible fluid overload with increased shortness of breath, initial encounter EXAM: PORTABLE CHEST 1 VIEW COMPARISON:  09/22/2020 FINDINGS: Postsurgical changes are again noted. Cardiac shadow is enlarged but stable. Lungs are clear. No significant vascular congestion is noted. No bony abnormality is noted. IMPRESSION: No findings to suggest fluid overload. Electronically Signed   By: Inez Catalina M.D.   On: 10/04/2020 17:18   CT RENAL STONE STUDY  Result Date: 10/04/2020 CLINICAL DATA:  Urinary tract stone, symptomatic/complicated. Elevated creatinine EXAM: CT ABDOMEN AND PELVIS WITHOUT CONTRAST TECHNIQUE: Multidetector CT imaging of the abdomen and pelvis was performed following the standard protocol without IV contrast. COMPARISON:  None. FINDINGS: Lower chest: Coronary artery calcifications.  Bibasilar scarring. Hepatobiliary: No focal liver abnormality. Status post cholecystectomy. No biliary dilatation. Pancreas: No focal lesion. Normal pancreatic contour. No surrounding inflammatory  changes. No main pancreatic ductal dilatation. Spleen: Normal in size without focal abnormality. Adrenals/Urinary Tract: No adrenal nodule bilaterally. Bilateral renal cortical scarring and atrophy. No nephrolithiasis and no hydronephrosis. No definite contour-deforming renal mass. No ureterolithiasis or hydroureter. The urinary bladder is unremarkable. Stomach/Bowel: Stomach is within normal limits. No evidence of bowel wall thickening or dilatation. Couple scattered colonic diverticula. Appendix appears normal. Vascular/Lymphatic: No abdominal aorta or iliac aneurysm. Mild atherosclerotic plaque of the aorta and its branches. No abdominal, pelvic, or inguinal lymphadenopathy. Reproductive: Status post hysterectomy. No adnexal masses. Other: No intraperitoneal free fluid. No intraperitoneal free gas. No organized fluid collection. Musculoskeletal: Tiny fat containing umbilical hernia. Asymmetrically atrophic right iliopsoas muscle. No suspicious lytic or blastic osseous lesions. No acute displaced fracture. Grade 1 anterolisthesis of L4 on L5 with associated degenerative changes. IMPRESSION: 1. No acute intra-abdominal or intrapelvic abnormality with limited evaluation on this noncontrast study. 2. Couple scattered colon diverticula with no acute diverticulitis. 3. Atrophic scarred kidneys. 4.  Aortic Atherosclerosis (ICD10-I70.0). Electronically Signed   By: Iven Finn M.D.   On: 10/04/2020 19:58      DVT prophylaxis: Patient on IV heparin  Code Status: DNR  Family Communication: No family at bedside   Consultants:   Procedures:     Objective    Physical Examination:  General-appears in no acute distress Heart-S1-S2, regular, no murmur auscultated Lungs-bilateral rhonchi auscultated Abdomen-soft, nontender, no organomegaly Extremities-no edema in the lower extremities Neuro-alert, oriented x3, no focal deficit noted  Status is: Inpatient  Dispo: The patient is from: Skilled  nursing facility              Anticipated d/c is to: Skilled nursing facility              Anticipated d/c date is: 8/19 /2022              Patient currently not stable for discharge  Barrier to discharge-acute kidney injury  COVID-19 Labs  No results for input(s): DDIMER, FERRITIN, LDH, CRP in the last 72 hours.  Lab Results  Component Value Date   [place] NEGATIVE 10/04/2020   [place] NEGATIVE 09/30/2020   [place] NEGATIVE 09/22/2020   [place] NEGATIVE 04/02/2020    Microbiology  Recent Results (from the past 240 hour(s))  Resp Panel by RT-PCR (Flu A&B, Covid) Nasopharyngeal Swab     Status: None   Collection Time: 09/30/20 10:20 AM  Specimen: Nasopharyngeal Swab; Nasopharyngeal(NP) swabs in vial transport medium  Result Value Ref Range Status   SARS Coronavirus 2 by RT PCR NEGATIVE NEGATIVE Final    Comment: (NOTE) SARS-CoV-2 target nucleic acids are NOT DETECTED.  The SARS-CoV-2 RNA is generally detectable in upper respiratory specimens during the acute phase of infection. The lowest concentration of SARS-CoV-2 viral copies this assay can detect is 138 copies/mL. A negative result does not preclude SARS-Cov-2 infection and should not be used as the sole basis for treatment or other patient management decisions. A negative result may occur with  improper specimen collection/handling, submission of specimen other than nasopharyngeal swab, presence of viral mutation(s) within the areas targeted by this assay, and inadequate number of viral copies(<138 copies/mL). A negative result must be combined with clinical observations, patient history, and epidemiological information. The expected result is Negative.  Fact Sheet for Patients:  EntrepreneurPulse.com.au  Fact Sheet for Healthcare Providers:  IncredibleEmployment.be  This test is no t yet approved or cleared by the [place] FDA and  has been authorized for  detection and/or diagnosis of SARS-CoV-2 by FDA under an Emergency Use Authorization (EUA). This EUA will remain  in effect (meaning this test can be used) for the duration of the COVID-19 declaration under Section 564(b)(1) of the Act, 21 U.S.C.section 360bbb-3(b)(1), unless the authorization is terminated  or revoked sooner.       Influenza A by PCR NEGATIVE NEGATIVE Final   Influenza B by PCR NEGATIVE NEGATIVE Final    Comment: (NOTE) The Xpert Xpress SARS-CoV-2/FLU/RSV plus assay is intended as an aid in the diagnosis of influenza from Nasopharyngeal swab specimens and should not be used as a sole basis for treatment. Nasal washings and aspirates are unacceptable for Xpert Xpress SARS-CoV-2/FLU/RSV testing.  Fact Sheet for Patients: EntrepreneurPulse.com.au  Fact Sheet for Healthcare Providers: IncredibleEmployment.be  This test is not yet approved or cleared by the [place] FDA and has been authorized for detection and/or diagnosis of SARS-CoV-2 by FDA under an Emergency Use Authorization (EUA). This EUA will remain in effect (meaning this test can be used) for the duration of the COVID-19 declaration under Section 564(b)(1) of the Act, 21 U.S.C. section 360bbb-3(b)(1), unless the authorization is terminated or revoked.  Performed at [facility], [place] [address], [place], [place] [address]   SARS CORONAVIRUS 2 (TAT 6-24 HRS) Nasopharyngeal Nasopharyngeal Swab     Status: None   Collection Time: 10/04/20  5:24 PM   Specimen: Nasopharyngeal Swab  Result Value Ref Range Status   SARS Coronavirus 2 NEGATIVE NEGATIVE Final    Comment: (NOTE) SARS-CoV-2 target nucleic acids are NOT DETECTED.  The SARS-CoV-2 RNA is generally detectable in upper and lower respiratory specimens during the acute phase of infection. Negative results do not preclude SARS-CoV-2 infection, do not rule out co-infections with other  pathogens, and should not be used as the sole basis for treatment or other patient management decisions. Negative results must be combined with clinical observations, patient history, and epidemiological information. The expected result is Negative.  Fact Sheet for Patients: SugarRoll.be  Fact Sheet for Healthcare Providers: https://www.woods-mathews.com/  This test is not yet approved or cleared by the [place] FDA and  has been authorized for detection and/or diagnosis of SARS-CoV-2 by FDA under an Emergency Use Authorization (EUA). This EUA will remain  in effect (meaning this test can be used) for the duration of the COVID-19 declaration under Se ction 564(b)(1) of the Act, 21 U.S.C. section  360bbb-3(b)(1), unless the authorization is terminated or revoked sooner.  Performed at [facility], [place] [address] [place], [place] [address]          [facility] If 7PM-7AM, please contact night-coverage at [email or web address], Office  [phone]   10/06/2020, 3:01 PM  LOS: 2 days

## 2020-10-06 NOTE — Progress Notes (Addendum)
Progress Note  Patient Name: Ebony Scott Date of Encounter: 10/06/2020  [facility] Cardiologist: Buford Dresser, MD   Subjective   Complaining of SOB and cough this morning. Had some mild swelling in her ankles. No complaints of chest pain.   Inpatient Medications    Scheduled Meds:  allopurinol  100 mg Oral Daily   arformoterol  15 mcg Nebulization BID   And   umeclidinium bromide  1 puff Inhalation Daily   atorvastatin  10 mg Oral Daily   bisoprolol  2.5 mg Oral Daily   colchicine  0.6 mg Oral QODAY   cyproheptadine  8 mg Oral QPM   fluconazole  200 mg Oral Daily   folic acid  1 mg Oral Daily   gabapentin  300 mg Oral QHS   hydrocortisone sod succinate (SOLU-CORTEF) inj  25 mg Intravenous Q8H   insulin aspart  0-9 Units Subcutaneous TID WC   loratadine  10 mg Oral Daily   magnesium oxide  400 mg Oral Daily   mirtazapine  30 mg Oral QHS   montelukast  10 mg Oral Daily   pantoprazole  40 mg Oral Daily   Continuous Infusions:  heparin 1,200 Units/hr (10/06/20 0057)   PRN Meds: acetaminophen **OR** acetaminophen, albuterol, guaiFENesin-dextromethorphan   Vital Signs    Vitals:   10/06/20 0223 10/06/20 0500 10/06/20 0510 10/06/20 0837  BP: 140/76  120/63   Pulse: 65  (!) 54   Resp: 18     Temp: 98.1 F (36.7 C)  98.2 F (36.8 C)   TempSrc: Oral  Oral   SpO2: 98%  96% 96%  Weight:  60.4 kg      Intake/Output Summary (Last 24 hours) at 10/06/2020 0855 Last data filed at 10/05/2020 2359 Gross per 24 hour  Intake 1508.14 ml  Output 650 ml  Net 858.14 ml   Last 3 Weights 10/06/2020 09/30/2020 09/22/2020  Weight (lbs) 133 lb 2.5 oz 141 lb 12.1 oz 140 lb  Weight (kg) 60.4 kg 64.3 kg 63.504 kg      Telemetry    Sinus rhythm/sinus bradycardia - Personally Reviewed  ECG    No new tracings - Personally Reviewed  Physical Exam   GEN: No acute distress.   Neck: No JVD Cardiac: RRR, no murmurs, rubs, or gallops.  Respiratory: Appears mildly  tachypneic with scattered rhonchi on exam; no overt wheezing/rales. Occasionally coughing. GI: Soft, nontender, non-distended  MS: No edema; No deformity. Neuro:  Nonfocal  Psych: Normal affect   Labs    High Sensitivity Troponin:  No results for input(s): TROPONINIHS in the last 720 hours.    Chemistry Recent Labs  Lab 10/04/20 1455 10/04/20 2257 10/05/20 0447 10/06/20 0640  NA 131* 136 141 144  K 6.0* 5.8* 4.9 3.9  CL 97* 103 110 114*  CO2 21* 21* 21* 18*  GLUCOSE 345* 274* 160* 136*  BUN 127* 89* 106* 68*  CREATININE 4.07* 3.23* 2.82* 1.74*  CALCIUM 7.7* 7.4* 7.3* 7.7*  PROT 6.6  --   --  5.4*  ALBUMIN 2.6*  --   --  2.3*  AST 98*  --   --  37  ALT 132*  --   --  72*  ALKPHOS 268*  --   --  191*  BILITOT 0.5  --   --  0.6  GFRNONAA 11* 15* 17* 31*  ANIONGAP '13 12 10 12     '$ Hematology Recent Labs  Lab 10/04/20 1455 10/05/20 0447 10/06/20  0640  WBC 15.8* 15.8* 13.3*  RBC 3.44* 3.09* 3.13*  HGB 11.3* 10.0* 10.2*  HCT 35.8* 32.1* 32.9*  MCV 104.1* 103.9* 105.1*  MCH 32.8 32.4 32.6  MCHC 31.6 31.2 31.0  RDW 16.3* 16.4* 16.8*  PLT 372 374 331    BNP Recent Labs  Lab 10/04/20 1455  BNP 1,083.7*     DDimer No results for input(s): DDIMER in the last 168 hours.   Radiology    DG Chest Port 1 View  Result Date: 10/04/2020 CLINICAL DATA:  Possible fluid overload with increased shortness of breath, initial encounter EXAM: PORTABLE CHEST 1 VIEW COMPARISON:  09/22/2020 FINDINGS: Postsurgical changes are again noted. Cardiac shadow is enlarged but stable. Lungs are clear. No significant vascular congestion is noted. No bony abnormality is noted. IMPRESSION: No findings to suggest fluid overload. Electronically Signed   By: Inez Catalina M.D.   On: 10/04/2020 17:18   CT RENAL STONE STUDY  Result Date: 10/04/2020 CLINICAL DATA:  Urinary tract stone, symptomatic/complicated. Elevated creatinine EXAM: CT ABDOMEN AND PELVIS WITHOUT CONTRAST TECHNIQUE: Multidetector  CT imaging of the abdomen and pelvis was performed following the standard protocol without IV contrast. COMPARISON:  None. FINDINGS: Lower chest: Coronary artery calcifications.  Bibasilar scarring. Hepatobiliary: No focal liver abnormality. Status post cholecystectomy. No biliary dilatation. Pancreas: No focal lesion. Normal pancreatic contour. No surrounding inflammatory changes. No main pancreatic ductal dilatation. Spleen: Normal in size without focal abnormality. Adrenals/Urinary Tract: No adrenal nodule bilaterally. Bilateral renal cortical scarring and atrophy. No nephrolithiasis and no hydronephrosis. No definite contour-deforming renal mass. No ureterolithiasis or hydroureter. The urinary bladder is unremarkable. Stomach/Bowel: Stomach is within normal limits. No evidence of bowel wall thickening or dilatation. Couple scattered colonic diverticula. Appendix appears normal. Vascular/Lymphatic: No abdominal aorta or iliac aneurysm. Mild atherosclerotic plaque of the aorta and its branches. No abdominal, pelvic, or inguinal lymphadenopathy. Reproductive: Status post hysterectomy. No adnexal masses. Other: No intraperitoneal free fluid. No intraperitoneal free gas. No organized fluid collection. Musculoskeletal: Tiny fat containing umbilical hernia. Asymmetrically atrophic right iliopsoas muscle. No suspicious lytic or blastic osseous lesions. No acute displaced fracture. Grade 1 anterolisthesis of L4 on L5 with associated degenerative changes. IMPRESSION: 1. No acute intra-abdominal or intrapelvic abnormality with limited evaluation on this noncontrast study. 2. Couple scattered colon diverticula with no acute diverticulitis. 3. Atrophic scarred kidneys. 4.  Aortic Atherosclerosis (ICD10-I70.0). Electronically Signed   By: Iven Finn M.D.   On: 10/04/2020 19:58    Cardiac Studies   Echocardiogram 09/16/2020:  1. Septal apical and anterior wall hypokinesis EF decreased compard to  TTE done 07/10/19 .  Left ventricular ejection fraction, by estimation, is  35 to 40%. The left ventricle has moderately decreased function. The left  ventricle demonstrates regional  wall motion abnormalities (see scoring diagram/findings for description).  The left ventricular internal cavity size was moderately dilated. Left  ventricular diastolic parameters are consistent with Grade II diastolic  dysfunction (pseudonormalization).  Elevated left ventricular end-diastolic pressure.   2. Right ventricular systolic function is normal. The right ventricular  size is normal.   3. Left atrial size was mildly dilated.   4. The mitral valve is abnormal. Mild mitral valve regurgitation. No  evidence of mitral stenosis.   5. The aortic valve is tricuspid. There is moderate calcification of the  aortic valve. Aortic valve regurgitation is not visualized. Mild to  moderate aortic valve sclerosis/calcification is present, without any  evidence of aortic stenosis.   6. The inferior vena  cava is normal in size with greater than 50%  respiratory variability, suggesting right atrial pressure of 3 mmHg.  Patient Profile     70 y.o. 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 Patient was recently admitted for septic right elbow olecranon bursitis and cellulitis of right hand. Underwent I&D on 09/22/2020. Cardiology was consulted for further evaluation of newly diagnosed cardiomyopathy and she was started on GDMT (Entresto and Bisoprolol). Discharged on 09/30/2020. Readmitted on 10/04/2020 for AKI with creatinine of 4.07. Cardiology following to assist with medication adjustments   Assessment & Plan    1. Chronic Heart Failure with Newly  Reduced EF: Echo on 09/16/2020 showed LVEF of 35-40% with septal apical and anterior wall hypokinesis which are new. EF previously >75% in 2021. BNP elevated at 1,083 (424 on 8/4). Chest x-ray showed no acute findings. Entresto stopped due to AKI. Reports worsening SOB this morning and ongoing cough - Will check CXR given rhonchi on exam - Continue Bisoprolol 2.'5mg'$  daily. - Low threshold for lasix given recent IVF resuscitation  - No Farxiga for now given recent fungal infection. - Patient will ultimately need an ischemic evaluation  Prior chest CT showed diffuse coronary calcifications. Plan is for outpatient cardiac catheterization once patient recovers from acute infection and pending further improvement in renal function.   2. Hypotensive with History of Hypertension: BP as low as 83/50 upon arrival. Improving with IV fluids, stopped overnight - Continue Bisoprolol 2.'5mg'$  daily. - Continue to hold Entresto. - Could consider hydralazine for afterload reduction if BP is persistently elevated.   3. Hyperlipidemia: Lipid panel this admission: Total Cholesterol 318, Triglycerides 475, HDL 38. LDL could not be calculated due to Triglyceride level. Direct LDL 141. LDL goal <70 given CAD. Lipitor increased to '80mg'$  daily during last admission but she was discharged on '10mg'$  daily. LFTs elevated on admission but may be due to mild shock liver given hypotension; repeat today with improvement in AST/ALT.  - Will increase atorvastatin to '80mg'$  daily - Will need repeat FLP/LFTs in 6-8 weeks   4. 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 Xarelto held on admission due to AKI. Currently on heparin gtt.  - Continue management per primary team.   5. Acute on CKD Stage III: Creatinine 4.07 on admission but improving with IV fluids 3.23 > 2.82>1.74 today. Baseline around 1.3 to 1.6. Felt to be pre-renal in nature due to hypotension and Entresto. CT unremarkable. No obstruction. Entresto held. - Continue  IV fluids per primary. - Continue to monior.    6. Hyperkalemia: In setting of AKI. Recevied 2 dose of Lokelma and potassium normalized - 3.9 today  - Continue to monitor.   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 Agree  with above.  70 year old with systolic heart failure AKI.  Agree with holding the Entresto.  Hydralazine could be considered if blood pressure control is needed for afterload reduction.  We will continue with bisoprolol low-dose.  Her baseline creatinine is between 1.3 and 1.6.  Currently getting better in the setting of AKI.  Consider holding her colchicine as well in this setting.  Getting chest x-ray.  Rhonchorous on exam.  Still satting well on room air.  We will keep an eye on this.  We will try to avoid diuretics since she came in with AKI.  Hold off on further fluid administration.  Since creatinine is close to baseline.  We will stop her heparin IV and start her on her Xarelto as she was taking as outpatient.  Candee Furbish, MD

## 2020-10-06 NOTE — Progress Notes (Addendum)
ANTICOAGULATION CONSULT NOTE - Initial Consult  Pharmacy Consult for Heparin Indication: DVT  (on Xarelto PTA)  Allergies  Allergen Reactions   Baclofen Other (See Comments)    Altered mental status requiring a 3-day hospital stay- patient became unresponsive   Dust Mite Extract Shortness Of Breath and Other (See Comments)    "sneezing" (02/20/2012)   Molds & Smuts Shortness Of Breath   Morphine And Related Hives and Itching   Other Shortness Of Breath and Other (See Comments)    Grass and weeds "sneezing; filled sinuses" (02/20/2012)   Penicillins Rash and Other (See Comments)    "welts" (02/20/2012) Tolerated Ancef 02/16/20.    Rofecoxib Swelling and Other (See Comments)    Vioxx- feet swelling    Shellfish Allergy Anaphylaxis, Shortness Of Breath, Itching and Rash   Shrimp Flavor Anaphylaxis and Other (See Comments)    ALL SHELLFISH, also   Tetracycline Hcl Nausea And Vomiting   Xolair [Omalizumab] Other (See Comments)    Caused Blood clot   Zoledronic Acid Other (See Comments)    Fever, Put in hospital, dr said it was a reaction from a reaction  Other reaction(s): Unknown   Dilaudid [Hydromorphone Hcl] Itching   Levofloxacin Other (See Comments)    GI upset   Oxycodone Hcl Nausea And Vomiting   Paroxetine Nausea And Vomiting and Other (See Comments)    Paxil    Celecoxib Swelling and Other (See Comments)    Feet swelling   Dilaudid [Hydromorphone] Other (See Comments)    "Allergic," per [facility]   Diltiazem Swelling   Diltiazem Hcl Swelling   Hydrocodone-Acetaminophen Nausea And Vomiting and Other (See Comments)    "Allergic," per [facility]   Lactose Intolerance (Gi) Other (See Comments)    Bloating and gas   Morphine Sulfate Other (See Comments)    "Gets loopy"   Oxycodone-Acetaminophen Other (See Comments)    Reaction??   Strawberry Flavor Other (See Comments)    Reaction?   Tree Extract Other (See Comments)    "tested and told I was allergic to it; never experienced a  reaction to it" (02/20/2012)   Penicillin G Procaine Rash and Other (See Comments)    Welts, also    Patient Measurements: Weight: 60.4 kg (133 lb 2.5 oz) Heparin Dosing Weight: 60 kg  Vital Signs: Temp: 98.2 F (36.8 C) (08/18 0510) Temp Source: Oral (08/18 0510) BP: 120/63 (08/18 0510) Pulse Rate: 54 (08/18 0510)  Labs: Recent Labs    10/04/20 1455 10/04/20 2257 10/05/20 0447 10/05/20 1030 10/05/20 1030 10/05/20 1545 10/05/20 2055 10/06/20 0640  HGB 11.3*  --  10.0*  --   --   --   --  10.2*  HCT 35.8*  --  32.1*  --   --   --   --  32.9*  PLT 372  --  374  --   --   --   --  331  APTT  --   --   --  54*   < > 71* 69* 179*  LABPROT  --  19.1*  --   --   --   --   --   --   INR  --  1.6*  --   --   --   --   --   --   HEPARINUNFRC  --   --   --  >1.10*  --  >1.10*  --  >1.10*  CREATININE 4.07* 3.23* 2.82*  --   --   --   --  1.74*   < > = values in this interval not displayed.    Estimated Creatinine Clearance: 24.4 mL/min (A) (by C-G formula based on SCr of 1.74 mg/dL (H)).   Medications:  PTA Xarelto '20mg'$  daily in the evening  Assessment: 70 yr female with h/o DVT and on Xarelto PTA admitted to hospital for acute renal failure CrCl < 15 ml/min so IV heparin to be started for anticoagulation while Xarelto is currently held Xarelto's effects will falsely elevate heparin level, so will monitor heparin gtt with aPTT until effects of Xarelto have worn off and the aPTT and HL correlate  10/06/20 9:32 AM  HL elevated as expected with recent Xarelto AM aPTT supratherapeutic at 179 (significant increase from levels yesterday) CBC stable Discussed w/ RN- heparin is running in L AC, levels drawn from L hand (levels drawn appropriately), unable to gain access from R hand/arm Noted bleeding around IV site requiring multiple dressing changes  Goal of Therapy:  Heparin level 0.3-0.7 units/ml aPTT 66-102 seconds Monitor platelets by anticoagulation protocol: Yes   Plan:   HOLD heparin drip x 1 hour Restart heparin drip at 1000 units/hr starting at 10:30 AM Check 8 hour aPTT Daily CBC, aPTT (until levels correlate), and heparin level Monitor for resolution of IV site bleeding F/u ability to transition back to PO anticoagulant  Dimple Nanas, PharmD 10/06/2020,9:32 AM  AM UPDATE: -Patient seen by cardiology this AM- ok to restart Xarelto given improvement in creatinine -Discontinue heparin drip orders and associated labs -Resume Xarelto '15mg'$  PO daily -Discussed with RN  Dimple Nanas, PharmD 10/06/2020 10:47 AM

## 2020-10-06 NOTE — Consult Note (Signed)
[facility] Inpatient Consult   10/06/2020  EMILYROSE LANTEIGNE 16-Sep-1950 [id number]  [place] [facility] [ACO] Patient: [facility]   Patient chart has been reviewed for readmissions less than 30 days and for high risk score for unplanned readmission.  Patient assessed for community [place] [facility] follow up needs. Per review, patient admitted from SNF. Primary provider office offers embedded care management services.  Plan: Continue to follow for progression and disposition.  Of note, [facility] Care Management services does not replace or interfere with any services that are arranged by inpatient case management or social work.   Netta Cedars, MSN, [facility] Liaison Nurse Mobile Phone [phone]  Toll free office [phone]

## 2020-10-07 ENCOUNTER — Encounter: Payer: Medicare PPO | Admitting: Podiatry

## 2020-10-07 ENCOUNTER — Telehealth: Payer: Self-pay | Admitting: *Deleted

## 2020-10-07 DIAGNOSIS — E1169 Type 2 diabetes mellitus with other specified complication: Secondary | ICD-10-CM

## 2020-10-07 DIAGNOSIS — M069 Rheumatoid arthritis, unspecified: Secondary | ICD-10-CM

## 2020-10-07 LAB — CBC
HCT: 34.1 % — ABNORMAL LOW (ref 36.0–46.0)
Hemoglobin: 10.4 g/dL — ABNORMAL LOW (ref 12.0–15.0)
MCH: 32.3 pg (ref 26.0–34.0)
MCHC: 30.5 g/dL (ref 30.0–36.0)
MCV: 105.9 fL — ABNORMAL HIGH (ref 80.0–100.0)
Platelets: 309 10*3/uL (ref 150–400)
RBC: 3.22 MIL/uL — ABNORMAL LOW (ref 3.87–5.11)
RDW: 16.9 % — ABNORMAL HIGH (ref 11.5–15.5)
WBC: 12 10*3/uL — ABNORMAL HIGH (ref 4.0–10.5)
nRBC: 0.3 % — ABNORMAL HIGH (ref 0.0–0.2)

## 2020-10-07 LAB — BASIC METABOLIC PANEL
Anion gap: 10 (ref 5–15)
BUN: 48 mg/dL — ABNORMAL HIGH (ref 8–23)
CO2: 19 mmol/L — ABNORMAL LOW (ref 22–32)
Calcium: 8.1 mg/dL — ABNORMAL LOW (ref 8.9–10.3)
Chloride: 118 mmol/L — ABNORMAL HIGH (ref 98–111)
Creatinine, Ser: 1.58 mg/dL — ABNORMAL HIGH (ref 0.44–1.00)
GFR, Estimated: 35 mL/min — ABNORMAL LOW (ref >60)
Glucose, Bld: 85 mg/dL (ref 70–99)
Potassium: 3.7 mmol/L (ref 3.5–5.1)
Sodium: 147 mmol/L — ABNORMAL HIGH (ref 135–145)

## 2020-10-07 LAB — GLUCOSE, CAPILLARY
Glucose-Capillary: 86 mg/dL (ref 70–99)
Glucose-Capillary: 195 mg/dL — ABNORMAL HIGH (ref 70–99)
Glucose-Capillary: 107 mg/dL — ABNORMAL HIGH (ref 70–99)
Glucose-Capillary: 86 mg/dL (ref 70–99)

## 2020-10-07 MED ORDER — TRAMADOL HCL 50 MG PO TABS: 50.0000 mg | ORAL_TABLET | Freq: Four times a day (QID) | ORAL | Status: DC | PRN

## 2020-10-07 MED ORDER — TRAMADOL HCL 50 MG PO TABS
50.0000 mg | ORAL_TABLET | Freq: Once | ORAL | Status: AC
  Administered 2020-10-07: 50 mg via ORAL
  Filled 2020-10-07: qty 1

## 2020-10-07 MED ORDER — TRAMADOL HCL 50 MG PO TABS
50.0000 mg | ORAL_TABLET | Freq: Two times a day (BID) | ORAL | Status: DC | PRN
  Administered 2020-10-07 – 2020-10-10 (×5): 50 mg via ORAL
  Filled 2020-10-07 (×5): qty 1

## 2020-10-07 MED ORDER — PREDNISONE 20 MG PO TABS
20.0000 mg | ORAL_TABLET | Freq: Every day | ORAL | Status: DC
  Administered 2020-10-08 – 2020-10-11 (×4): 20 mg via ORAL
  Filled 2020-10-07 (×4): qty 1

## 2020-10-07 NOTE — Telephone Encounter (Signed)
-----   Message from Abigail Butts, PA-C sent at 10/07/2020 11:36 AM EDT ----- Regarding: TOC call Hey there! This patient is scheduled to see Dr. Harrell Gave 10/18/20 at 8am for hospital follow-up. Can you place a TOC call? Thanks!

## 2020-10-07 NOTE — Evaluation (Signed)
Occupational Therapy Evaluation Patient Details Name: Ebony Scott MRN: [id number] DOB: December 13, 1950 Today's Date: 10/07/2020    History of Present Illness Ebony Scott is a 70 y.o. 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 Patient s/p Excisional debridement, right olecranon bursa with olecranon bursectomy and irrigation and debridement and excisional debridement, right dorsal rheumatoid nodule, overlying the long metacarpal phalangeal joint.  Pt readmitted 10/04/20 for AKI.  Past medical history significant of ashtma, combined systolic/diastolic HF, PE, tracheobronchomalacia.   Clinical Impression   [name] is a 70 year old woman who presents with above medical history. On evaluation she exhibits generalized weakness, decreased ROM and strength of RUE, poor activity tolerance and impaired balance. Patient needing increased assistance for ADLs and min guard for ambulation. Patient only able to tolerate minimal standing and steps forward and back before complaining of dyspnea and needing to rest. Patient will benefit from skilled OT services while in hospital to improve deficits and learn compensatory strategies as needed in order to return to PLOF.      Follow Up Recommendations  SNF    Equipment Recommendations  None recommended by OT    Recommendations for Other Services       Precautions / Restrictions Precautions Precautions: Fall Required Braces or Orthoses: Other Brace Splint/Cast: per ortho note 8/17: discontinued splint Other Brace: pt has right post op shoe and sneaker for left foot Restrictions Weight Bearing Restrictions: No RUE Weight Bearing: Weight bearing as tolerated Other Position/Activity Restrictions: per ortho note 8/17: WBAT R UE      Mobility Bed Mobility Overal bed mobility: Needs Assistance Bed Mobility: Supine to Sit     Supine to sit: Min assist     General bed mobility comments: oob     Transfers Overall transfer level: Needs assistance Equipment used: Rolling walker (2 wheeled) Transfers: Sit to/from [employer] to Stand: Min guard Stand pivot transfers: Min assist       General transfer comment: Min guard with RW to ambulate 3 feet forward and back. Patient limited by reports of dyspnea - which is normal for her.    Balance Overall balance assessment: Needs assistance Sitting-balance support: No upper extremity supported Sitting balance-Leahy Scale: Good     Standing balance support: During functional activity Standing balance-Leahy Scale: Poor Standing balance comment: reliant on UE support                           ADL either performed or assessed with clinical judgement   ADL Overall ADL's : Needs assistance/impaired Eating/Feeding: Set up;Sitting   Grooming: Set up;Sitting   Upper Body Bathing: Minimal assistance;Sitting   Lower Body Bathing: Maximal assistance;Sit to/from stand   Upper Body Dressing : Minimal assistance;Sitting   Lower Body Dressing: Maximal assistance;Sit to/from stand   Toilet Transfer: Min guard;BSC;RW   Toileting- Water quality scientist and Hygiene: Moderate assistance;Sit to/from stand       Functional mobility during ADLs: Min guard;Rolling walker       Vision   Vision Assessment?: No apparent visual deficits     Perception     Praxis      Pertinent Vitals/Pain Pain Assessment: Faces Faces Pain Scale: Hurts a little bit Pain Location: R UE Pain Descriptors / Indicators: Discomfort Pain Intervention(s): Monitored during session     Hand Dominance Right   Extremity/Trunk Assessment Upper Extremity Assessment Upper Extremity Assessment: RUE  deficits/detail;LUE deficits/detail RUE Deficits / Details: WFL ROM - strength not tested. has a tendency to let tricep extension and finger flexion lag. RUE Coordination:  (grossly functional.) LUE Deficits / Details: WFL ROM and  grossly 4/5  strength LUE Sensation: WNL LUE Coordination: WNL   Lower Extremity Assessment Lower Extremity Assessment: Defer to PT evaluation RLE Deficits / Details: recent R foot surgery, wearing post op shoe still   Cervical / Trunk Assessment Cervical / Trunk Assessment: Kyphotic   Communication Communication Communication: No difficulties   Cognition Arousal/Alertness: Awake/alert Behavior During Therapy: WFL for tasks assessed/performed Overall Cognitive Status: Within Functional Limits for tasks assessed                                     General Comments       Exercises     Shoulder Instructions      Home Living Family/patient expects to be discharged to:: Skilled nursing facility Living Arrangements: Children Available Help at Discharge: Family;Available PRN/intermittently Type of Home: House Home Access: Stairs to enter [employer] of Steps: family does not let her use inside steps- stays on main floor once inside Entrance Stairs-Rails: Left Home Layout: Two level;Other (Comment);Able to live on main level with bedroom/bathroom     Bathroom Shower/Tub: Occupational psychologist: Standard     Home Equipment: Toilet riser;Shower seat;Cane - single point   Additional Comments: Daughter is an Therapist, sports that works night shift.      Prior Functioning/Environment Level of Independence: Independent with assistive device(s)        Comments: enjoys [employer]; crossword puzzles        OT Problem List: Decreased strength;Decreased range of motion;Decreased activity tolerance;Impaired balance (sitting and/or standing);Impaired UE functional use;Pain;Cardiopulmonary status limiting activity      OT Treatment/Interventions: Self-care/ADL training;Therapeutic exercise;DME and/or AE instruction;Balance training;Therapeutic activities    OT Goals(Current goals can be found in the care plan section) Acute Rehab OT Goals Patient  Stated Goal: go home with some help OT Goal Formulation: With patient Time For Goal Achievement: 10/07/20 Potential to Achieve Goals: Good  OT Frequency: Min 2X/week   Barriers to D/C:            Co-evaluation              AM-PAC OT "6 Clicks" Daily Activity     Outcome Measure Help from another person eating meals?: A Little Help from another person taking care of personal grooming?: A Little Help from another person toileting, which includes using toliet, bedpan, or urinal?: A Lot Help from another person bathing (including washing, rinsing, drying)?: A Lot Help from another person to put on and taking off regular upper body clothing?: A Little Help from another person to put on and taking off regular lower body clothing?: A Lot 6 Click Score: 15   End of Session Equipment Utilized During Treatment: Rolling walker;Gait belt Nurse Communication: Mobility status  Activity Tolerance: Patient tolerated treatment well Patient left: in chair;with call bell/phone within reach  OT Visit Diagnosis: Unsteadiness on feet (R26.81);Pain Pain - Right/Left: Right Pain - part of body: Arm                Time: [id number] OT Time Calculation (min): 13 min Charges:  OT General Charges $OT Visit: 1 Visit OT Evaluation $OT Eval Low Complexity: 1 Low  Mayes Sangiovanni, OTR/L Acute Care Rehab Services  Office [phone] Pager: [place] 10/07/2020, 3:59 PM

## 2020-10-07 NOTE — Plan of Care (Signed)
Pt states she is feeling better today. She remained in the chair for lunch and dinner. She received a bath, wound care dressing changes and insulin checks. Pt kept within fluid restriction. No critical issues today; will continue to monitor.

## 2020-10-07 NOTE — Care Management Important Message (Signed)
Important Message  Patient Details Verbal Consent given via phone Name: Ebony Scott MRN: [id number] Date of Birth: December 21, 1950   Medicare Important Message Given:  Yes     Kerin Salen 10/07/2020, 12:26 PM

## 2020-10-07 NOTE — Telephone Encounter (Signed)
Patient is currently admitted to hospital on 10/07/20. She will need to called on 10/10/20

## 2020-10-07 NOTE — NC FL2 (Signed)
[place] LEVEL OF CARE SCREENING TOOL     IDENTIFICATION  Patient Name: Ebony Scott Birthdate: 02/26/1950 Sex: female Admission Date (Current Location): 10/04/2020  [facility] and [place] Number:  Herbalist and Address:  [facility],  [place] [place], [place]      Provider Number: [id number]  Attending Physician Name and Address:  Oswald Hillock, MD  Relative Name and Phone Number:  Sosefina Ewald dtr [phone]    Current Level of Care: Hospital Recommended Level of Care: [place] Prior Approval Number:    Date Approved/Denied:   PASRR Number: [id number]  Discharge Plan: SNF    Current Diagnoses: Patient Active Problem List   Diagnosis Date Noted   ARF (acute renal failure) ([place]) 10/04/2020   Physical deconditioning 09/25/2020   History of DVT (deep vein thrombosis) 09/24/2020   Osteomyelitis of second toe of right foot ([place]) 09/23/2020   DMII (diabetes mellitus, type 2) ([place]) 09/23/2020   Cellulitis of right hand 09/22/2020   Septic olecranon bursitis of right elbow 09/22/2020   Abscess of dorsum of right hand 0000000   Metabolic encephalopathy [id number]   Acute encephalopathy    AMS (altered mental status) 02/23/2020   S/P reverse total shoulder arthroplasty, right    Hypokalemia    DOE (dyspnea on exertion) 02/18/2020   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   S/P reverse total shoulder arthroplasty, left 02/16/2020   Preoperative clearance 01/26/2020   Closed displaced fracture of metatarsal bone of right foot 01/20/2019   History of pulmonary embolus (PE) 12/19/2018   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   Rheumatoid arthritis ([place])    Bilateral impacted cerumen 08/18/2018   Fungal otitis externa 08/18/2018   Asthmatic bronchitis with exacerbation 07/29/2018   Pulmonary embolism ([place]) 07/29/2018   Cellulitis of forearm    Fever of unknown origin (FUO) 02/10/2018   Abdominal pain  02/10/2018   Fracture of base of fifth metatarsal bone with routine healing, left    Closed fracture of fifth metatarsal bone of right foot, initial encounter 01/15/2018   DVT (deep venous thrombosis) ([place]) 01/14/2018   Hypothyroidism 01/14/2018   Depression 01/14/2018   Debility    Malnutrition of moderate degree 12/17/2017   Knee pain    History of breast cancer    Chronic pain syndrome    Fibromyalgia    Graves disease    Sepsis due to undetermined organism ([place]) 12/13/2017   Rhabdomyolysis 12/13/2017   Dysphonia 08/10/2017   Pulmonary nodules    Chronic kidney disease, stage 3b ([place]) 04/15/2016   Hx of adenomatous colonic polyps 04/12/2016   Cough 02/09/2016   Opacity of lung on imaging study 11/03/2015   Severe persistent asthma 02/08/2015   Facial pain syndrome 02/08/2015   Insomnia 02/08/2015   Other allergic rhinitis 02/08/2015   LPRD (laryngopharyngeal reflux disease) 02/08/2015   Chronic combined systolic and diastolic CHF (congestive heart failure) ([place]) 02/08/2015   Benign paroxysmal positional vertigo 12/03/2012   Dizziness and giddiness 10/29/2012   Disequilibrium 10/29/2012   Pseudogout 02/24/2012   Pneumonia 05/31/2011   Irritable bowel syndrome 01/02/2010   Hyperlipidemia 12/23/2009   Hyperthyroidism 11/12/2006   Seasonal and perennial allergic rhinitis 11/12/2006   GERD 11/12/2006   NECK PAIN, CHRONIC 11/12/2006   OSTEOPOROSIS 11/12/2006   BREAST CANCER, HX OF 11/12/2006    Orientation RESPIRATION BLADDER Height & Weight     Self, Time, Situation, Place  Normal Continent Weight: 59.8 kg Height:  5'  1" (154.9 cm)  BEHAVIORAL SYMPTOMS/MOOD NEUROLOGICAL BOWEL NUTRITION STATUS      Continent Diet (Heart Healthy)  AMBULATORY STATUS COMMUNICATION OF NEEDS Skin   Limited Assist Verbally Normal                       Personal Care Assistance Level of Assistance  Bathing, Feeding, Dressing Bathing Assistance: Limited assistance Feeding assistance:  Limited assistance Dressing Assistance: Limited assistance     Functional Limitations Info  Sight, Hearing, Speech Sight Info: Impaired (readers) Hearing Info: Adequate Speech Info: Adequate    SPECIAL CARE FACTORS FREQUENCY  PT (By licensed PT), OT (By licensed OT)     PT Frequency:  (5x week) OT Frequency:  (5x week)            Contractures Contractures Info: Not present    Additional Factors Info  Code Status, Allergies, Insulin Sliding Scale Code Status Info:  (DNR)     Insulin Sliding Scale Info:  (SSI)       Current Medications (10/07/2020):  This is the current hospital active medication list Current Facility-Administered Medications  Medication Dose Route Frequency Provider Last Rate Last Admin   acetaminophen (TYLENOL) tablet 650 mg  650 mg Oral Q6H PRN Rise Patience, MD   650 mg at 10/07/20 0045   Or   acetaminophen (TYLENOL) suppository 650 mg  650 mg Rectal Q6H PRN Rise Patience, MD       albuterol (PROVENTIL) (2.5 MG/3ML) 0.083% nebulizer solution 2.5 mg  2.5 mg Nebulization Q4H PRN Rise Patience, MD       allopurinol (ZYLOPRIM) tablet 100 mg  100 mg Oral Daily Rise Patience, MD   100 mg at 10/07/20 0926   arformoterol (BROVANA) nebulizer solution 15 mcg  15 mcg Nebulization BID Rise Patience, MD   15 mcg at 10/07/20 [id number]   And   umeclidinium bromide (INCRUSE ELLIPTA) 62.5 MCG/INH 1 puff  1 puff Inhalation Daily Rise Patience, MD   1 puff at 10/07/20 0752   atorvastatin (LIPITOR) tablet 80 mg  80 mg Oral Daily Roby Lofts M., PA-C   80 mg at 10/07/20 0926   bisoprolol (ZEBETA) tablet 2.5 mg  2.5 mg Oral Daily Rise Patience, MD   2.5 mg at 10/07/20 0925   cyproheptadine (PERIACTIN) 4 MG tablet 8 mg  8 mg Oral QPM Rise Patience, MD   8 mg at 10/06/20 2113   fluconazole (DIFLUCAN) tablet 200 mg  200 mg Oral Daily Rise Patience, MD   200 mg at [id number]   folic acid (FOLVITE) tablet 1 mg  1  mg Oral Daily Rise Patience, MD   1 mg at 10/07/20 0925   gabapentin (NEURONTIN) capsule 300 mg  300 mg Oral QHS Rise Patience, MD   300 mg at 10/06/20 2113   guaiFENesin-dextromethorphan (ROBITUSSIN DM) 100-10 MG/5ML syrup 5 mL  5 mL Oral Q6H PRN Oswald Hillock, MD   5 mL at 10/06/20 0536   insulin aspart (novoLOG) injection 0-9 Units  0-9 Units Subcutaneous TID WC Oswald Hillock, MD   2 Units at 10/07/20 1143   loratadine (CLARITIN) tablet 10 mg  10 mg Oral Daily Rise Patience, MD   10 mg at 10/06/20 2015   magnesium oxide (MAG-OX) tablet 400 mg  400 mg Oral Daily Rise Patience, MD   400 mg at 10/07/20 0925   mirtazapine (REMERON SOL-TAB) disintegrating tablet  30 mg  30 mg Oral QHS Rise Patience, MD   30 mg at 10/06/20 2113   montelukast (SINGULAIR) tablet 10 mg  10 mg Oral Daily Rise Patience, MD   10 mg at 10/07/20 0925   pantoprazole (PROTONIX) EC tablet 40 mg  40 mg Oral Daily Rise Patience, MD   40 mg at 10/07/20 0925   [START ON 10/08/2020] predniSONE (DELTASONE) tablet 20 mg  20 mg Oral Q breakfast Oswald Hillock, MD       Rivaroxaban (XARELTO) tablet 15 mg  15 mg Oral Daily Jerline Pain, MD   15 mg at 10/07/20 [id number]   traMADol (ULTRAM) tablet 50 mg  50 mg Oral Q12H PRN Oswald Hillock, MD   50 mg at 10/07/20 1144     Discharge Medications: Please see discharge summary for a list of discharge medications.  Relevant Imaging Results:  Relevant Lab Results:   Additional Information SSN: [id number]  Covid vaccinations x 4  Kelina Beauchamp, Juliann Pulse, RN

## 2020-10-07 NOTE — Progress Notes (Signed)
[facility]  PROGRESS NOTE  Ebony Scott [id number] DOB: 1950/09/21 DOA: 10/04/2020 PCP: [name], MD   Brief HPI:   70 year old female with history of diffuse aorta and coronary calcifications noted on chest CT on 07/2017, chronic combined CHF with a EF of 35 to 40% on recent echo, SVT, prior PE on lifelong anticoagulation, asthma, tracheobronchomalacia on CPAP, bronchiectasis, vocal cord dysfunction, seronegative rheumatoid arthritis, CKD stage III who was recently mated to the hospital for septic right elbow olecranon bursitis and cellulitis of right hand.  She underwent incision and drainage on 09/22/2020.  Cardiology was consulted, and she was started on Entresto and bisoprolol.  Discharged on 09/30/2020.  Patient was readmitted with AKI, creatinine of 4.07.  Her creatinine was 1.8 on discharge on 11th August.    Subjective   Patient complains of shortness of breath this morning.  O2 sats 94% on room air.   Assessment/Plan:    Acute on chronic kidney disease stage III -Baseline creatinine around 1.6-1.8 -Presented with creatinine of 4.07; likely from Entresto and hypotension -Ebony Scott is currently on hold -Started on IV normal saline; creatinine improved to 1.58 -At baseline; will hold colchicine at this time -Follow BMP in am  Chronic combined systolic and diastolic CHF -Last EF was 35 to 40% in July 2022 -Entresto and bisoprolol were held -Cardiology has restarted bisoprolol, Entresto is still on hold -Diuretics on hold   COPD exacerbation -Continue Brovana, Incruse Ellipta -As needed albuterol nebulizer  Diabetes mellitus type 2 -Hemoglobin A1c was 8.1, 2 weeks ago -CBGs elevated due to steroids -We will start weaning off steroids at this time -Start sliding scale insulin with NovoLog  History of rheumatoid arthritis -Patient is on prednisone and Arava at home -Started on Solu-Cortef stress dose 50 mg every 8 hours -Solu-Cortef has been weaned  off -Patient takes prednisone 15 mg daily at home -We will increase prednisone to 20 mg p.o. daily  History of DVT/PE -Patient is on Xarelto at home -Started on heparin per pharmacy -Xarelto has been restarted  Recent septic olecranon bursitis -S/p debridement during last admission -Culture grew Candida albicans; started on fluconazole till October 20, 2020   Scheduled medications:    allopurinol  100 mg Oral Daily   arformoterol  15 mcg Nebulization BID   And   umeclidinium bromide  1 puff Inhalation Daily   atorvastatin  80 mg Oral Daily   bisoprolol  2.5 mg Oral Daily   cyproheptadine  8 mg Oral QPM   fluconazole  200 mg Oral Daily   folic acid  1 mg Oral Daily   gabapentin  300 mg Oral QHS   insulin aspart  0-9 Units Subcutaneous TID WC   loratadine  10 mg Oral Daily   magnesium oxide  400 mg Oral Daily   mirtazapine  30 mg Oral QHS   montelukast  10 mg Oral Daily   pantoprazole  40 mg Oral Daily   [START ON 10/08/2020] predniSONE  20 mg Oral Q breakfast   rivaroxaban  15 mg Oral Daily         Data Reviewed:   CBG:  Recent Labs  Lab 10/06/20 1211 10/06/20 1711 10/06/20 2020 10/07/20 0721 10/07/20 1132  GLUCAP 241* 144* 132* 86 195*    SpO2: 94 % O2 Flow Rate (L/min): 2 L/min    Vitals:   10/07/20 0648 10/07/20 0648 10/07/20 0752 10/07/20 1223  BP: (!) 127/51 (!) 127/51  (!) 161/71  Pulse: 70 70  62  Resp: '20 20  20  '$ Temp: 97.9 F (36.6 C) 97.9 F (36.6 C)  98 F (36.7 C)  TempSrc:    Oral  SpO2: 95% 95% 98% 94%  Weight:      Height:         Intake/Output Summary (Last 24 hours) at 10/07/2020 1433 Last data filed at 10/07/2020 0938 Gross per 24 hour  Intake 930 ml  Output --  Net 930 ml    08/17 1901 - 08/19 0700 In: 936.5 [P.O.:570; I.V.:366.5] Out: 650 [Urine:650]  Filed Weights   10/06/20 0500 10/06/20 2033 10/07/20 0500  Weight: 60.4 kg 60.4 kg 59.8 kg    CBC:  Recent Labs  Lab 10/04/20 1455 10/05/20 0447  10/06/20 0640 10/07/20 0444  WBC 15.8* 15.8* 13.3* 12.0*  HGB 11.3* 10.0* 10.2* 10.4*  HCT 35.8* 32.1* 32.9* 34.1*  PLT 372 374 331 309  MCV 104.1* 103.9* 105.1* 105.9*  MCH 32.8 32.4 32.6 32.3  MCHC 31.6 31.2 31.0 30.5  RDW 16.3* 16.4* 16.8* 16.9*  LYMPHSABS 0.5*  --   --   --   MONOABS 1.0  --   --   --   EOSABS 0.0  --   --   --   BASOSABS 0.1  --   --   --     Complete metabolic panel:  Recent Labs  Lab 10/04/20 1455 10/04/20 2257 10/05/20 0447 10/06/20 0640 10/07/20 0444  NA 131* 136 141 144 147*  K 6.0* 5.8* 4.9 3.9 3.7  CL 97* 103 110 114* 118*  CO2 21* 21* 21* 18* 19*  GLUCOSE 345* 274* 160* 136* 85  BUN 127* 89* 106* 68* 48*  CREATININE 4.07* 3.23* 2.82* 1.74* 1.58*  CALCIUM 7.7* 7.4* 7.3* 7.7* 8.1*  AST 98*  --   --  37  --   ALT 132*  --   --  72*  --   ALKPHOS 268*  --   --  191*  --   BILITOT 0.5  --   --  0.6  --   ALBUMIN 2.6*  --   --  2.3*  --   MG 3.1*  --   --   --   --   INR  --  1.6*  --   --   --   BNP 1,083.7*  --   --   --   --     No results for input(s): LIPASE, AMYLASE in the last 168 hours.  Recent Labs  Lab 10/04/20 1455 10/04/20 1724  BNP 1,083.7*  --   SARSCOV2NAA  --  NEGATIVE    ------------------------------------------------------------------------------------------------------------------ No results for input(s): CHOL, HDL, LDLCALC, TRIG, CHOLHDL, LDLDIRECT in the last 72 hours.  Lab Results  Component Value Date   HGBA1C 8.1 (H) 09/22/2020   ------------------------------------------------------------------------------------------------------------------ No results for input(s): TSH, T4TOTAL, T3FREE, THYROIDAB in the last 72 hours.  Invalid input(s): FREET3 ------------------------------------------------------------------------------------------------------------------ No results for input(s): VITAMINB12, FOLATE, FERRITIN, TIBC, IRON, RETICCTPCT in the last 72 hours.  Coagulation profile Recent Labs  Lab  10/04/20 2257  INR 1.6*   No results for input(s): DDIMER in the last 72 hours.  Cardiac Enzymes No results for input(s): CKTOTAL, CKMB, CKMBINDEX, TROPONINI in the last 168 hours.  ------------------------------------------------------------------------------------------------------------------    Component Value Date/Time   BNP 1,083.7 (H) 10/04/2020 1455   BNP 494 (H) 08/25/2020 1435     Antibiotics: Anti-infectives (From admission, onward)    Start     Dose/Rate Route Frequency  Ordered Stop   10/05/20 1000  fluconazole (DIFLUCAN) tablet 200 mg        200 mg Oral Daily 10/04/20 2106          Radiology Reports  DG Chest 2 View  Result Date: 10/06/2020 CLINICAL DATA:  Acute renal failure, shortness of breath EXAM: CHEST - 2 VIEW COMPARISON:  10/04/2020 FINDINGS: Mild enlargement of cardiac silhouette unchanged. Mediastinal contours and pulmonary vascularity normal. Slight chronic accentuation of interstitial markings. No acute infiltrate, pleural effusion or pneumothorax. Bones demineralized with postsurgical changes of cervical spine fusion and reverse RIGHT shoulder arthroplasty. IMPRESSION: Enlargement of cardiac silhouette. No acute abnormalities. Electronically Signed   By: Lavonia Dana M.D.   On: 10/06/2020 12:38      DVT prophylaxis: Patient on IV heparin  Code Status: DNR  Family Communication: No family at bedside   Consultants:   Procedures:     Objective    Physical Examination:  General-appears in no acute distress Heart-S1-S2, regular, no murmur auscultated Lungs-bilateral wheezing auscultated at lung bases Abdomen-soft, nontender, no organomegaly Extremities-no edema in the lower extremities Neuro-alert, oriented x3, no focal deficit noted  Status is: Inpatient  Dispo: The patient is from: Skilled nursing facility              Anticipated d/c is to: Skilled nursing facility              Anticipated d/c date is: 10/09/20               Patient currently not stable for discharge  Barrier to discharge-acute kidney injury  COVID-19 Labs  No results for input(s): DDIMER, FERRITIN, LDH, CRP in the last 72 hours.  Lab Results  Component Value Date   [place] NEGATIVE 10/04/2020   [place] NEGATIVE 09/30/2020   [place] NEGATIVE 09/22/2020   [place] NEGATIVE 04/02/2020    Microbiology  Recent Results (from the past 240 hour(s))  Resp Panel by RT-PCR (Flu A&B, Covid) Nasopharyngeal Swab     Status: None   Collection Time: 09/30/20 10:20 AM   Specimen: Nasopharyngeal Swab; Nasopharyngeal(NP) swabs in vial transport medium  Result Value Ref Range Status   SARS Coronavirus 2 by RT PCR NEGATIVE NEGATIVE Final    Comment: (NOTE) SARS-CoV-2 target nucleic acids are NOT DETECTED.  The SARS-CoV-2 RNA is generally detectable in upper respiratory specimens during the acute phase of infection. The lowest concentration of SARS-CoV-2 viral copies this assay can detect is 138 copies/mL. A negative result does not preclude SARS-Cov-2 infection and should not be used as the sole basis for treatment or other patient management decisions. A negative result may occur with  improper specimen collection/handling, submission of specimen other than nasopharyngeal swab, presence of viral mutation(s) within the areas targeted by this assay, and inadequate number of viral copies(<138 copies/mL). A negative result must be combined with clinical observations, patient history, and epidemiological information. The expected result is Negative.  Fact Sheet for Patients:  EntrepreneurPulse.com.au  Fact Sheet for Healthcare Providers:  IncredibleEmployment.be  This test is no t yet approved or cleared by the [place] FDA and  has been authorized for detection and/or diagnosis of SARS-CoV-2 by FDA under an Emergency Use Authorization (EUA). This EUA will remain  in effect (meaning this  test can be used) for the duration of the COVID-19 declaration under Section 564(b)(1) of the Act, 21 U.S.C.section 360bbb-3(b)(1), unless the authorization is terminated  or revoked sooner.       Influenza A by PCR NEGATIVE  NEGATIVE Final   Influenza B by PCR NEGATIVE NEGATIVE Final    Comment: (NOTE) The Xpert Xpress SARS-CoV-2/FLU/RSV plus assay is intended as an aid in the diagnosis of influenza from Nasopharyngeal swab specimens and should not be used as a sole basis for treatment. Nasal washings and aspirates are unacceptable for Xpert Xpress SARS-CoV-2/FLU/RSV testing.  Fact Sheet for Patients: EntrepreneurPulse.com.au  Fact Sheet for Healthcare Providers: IncredibleEmployment.be  This test is not yet approved or cleared by the [place] FDA and has been authorized for detection and/or diagnosis of SARS-CoV-2 by FDA under an Emergency Use Authorization (EUA). This EUA will remain in effect (meaning this test can be used) for the duration of the COVID-19 declaration under Section 564(b)(1) of the Act, 21 U.S.C. section 360bbb-3(b)(1), unless the authorization is terminated or revoked.  Performed at [facility], [place] [address], [place], [place] [address]   SARS CORONAVIRUS 2 (TAT 6-24 HRS) Nasopharyngeal Nasopharyngeal Swab     Status: None   Collection Time: 10/04/20  5:24 PM   Specimen: Nasopharyngeal Swab  Result Value Ref Range Status   SARS Coronavirus 2 NEGATIVE NEGATIVE Final    Comment: (NOTE) SARS-CoV-2 target nucleic acids are NOT DETECTED.  The SARS-CoV-2 RNA is generally detectable in upper and lower respiratory specimens during the acute phase of infection. Negative results do not preclude SARS-CoV-2 infection, do not rule out co-infections with other pathogens, and should not be used as the sole basis for treatment or other patient management decisions. Negative results must be combined with  clinical observations, patient history, and epidemiological information. The expected result is Negative.  Fact Sheet for Patients: SugarRoll.be  Fact Sheet for Healthcare Providers: https://www.woods-mathews.com/  This test is not yet approved or cleared by the [place] FDA and  has been authorized for detection and/or diagnosis of SARS-CoV-2 by FDA under an Emergency Use Authorization (EUA). This EUA will remain  in effect (meaning this test can be used) for the duration of the COVID-19 declaration under Se ction 564(b)(1) of the Act, 21 U.S.C. section 360bbb-3(b)(1), unless the authorization is terminated or revoked sooner.  Performed at [facility], [place] [address], [place], [place] [address]          [facility] If 7PM-7AM, please contact night-coverage at [email or web address], Office  [phone]   10/07/2020, 2:33 PM  LOS: 3 days

## 2020-10-07 NOTE — Progress Notes (Signed)
Progress Note  Patient Name: Ebony Scott Date of Encounter: 10/07/2020  [facility] Cardiologist: Buford Dresser, MD   Subjective   Had a rough night.  Could not sleep much.  Breathing is improved.  No chest pain.  Inpatient Medications    Scheduled Meds:  allopurinol  100 mg Oral Daily   arformoterol  15 mcg Nebulization BID   And   umeclidinium bromide  1 puff Inhalation Daily   atorvastatin  80 mg Oral Daily   bisoprolol  2.5 mg Oral Daily   cyproheptadine  8 mg Oral QPM   fluconazole  200 mg Oral Daily   folic acid  1 mg Oral Daily   gabapentin  300 mg Oral QHS   hydrocortisone sod succinate (SOLU-CORTEF) inj  25 mg Intravenous Q12H   insulin aspart  0-9 Units Subcutaneous TID WC   loratadine  10 mg Oral Daily   magnesium oxide  400 mg Oral Daily   mirtazapine  30 mg Oral QHS   montelukast  10 mg Oral Daily   pantoprazole  40 mg Oral Daily   rivaroxaban  15 mg Oral Daily   Continuous Infusions:  PRN Meds: acetaminophen **OR** acetaminophen, albuterol, guaiFENesin-dextromethorphan   Vital Signs    Vitals:   10/07/20 0500 10/07/20 0648 10/07/20 0648 10/07/20 0752  BP:  (!) 127/51 (!) 127/51   Pulse:  70 70   Resp:  20 20   Temp:  97.9 F (36.6 C) 97.9 F (36.6 C)   TempSrc:      SpO2:  95% 95% 98%  Weight: 59.8 kg     Height:        Intake/Output Summary (Last 24 hours) at 10/07/2020 0804 Last data filed at 10/07/2020 0600 Gross per 24 hour  Intake 570 ml  Output --  Net 570 ml   Last 3 Weights 10/07/2020 10/06/2020 10/06/2020  Weight (lbs) 131 lb 13.4 oz 133 lb 2.5 oz 133 lb 2.5 oz  Weight (kg) 59.8 kg 60.4 kg 60.4 kg      Telemetry    Sinus rhythm 70s, rare PVC- Personally Reviewed  ECG    No new tracings - Personally Reviewed  Physical Exam   GEN: No acute distress.   Neck: No JVD Cardiac: RRR, no murmurs, rubs, or gallops.  Respiratory: Minimal rhonchi bilaterally. GI: Soft, nontender, non-distended  MS: No edema; No  deformity. Neuro:  Nonfocal  Psych: Normal affect   Labs    High Sensitivity Troponin:  No results for input(s): TROPONINIHS in the last 720 hours.    Chemistry Recent Labs  Lab 10/04/20 1455 10/04/20 2257 10/05/20 0447 10/06/20 0640 10/07/20 0444  NA 131*   < > 141 144 147*  K 6.0*   < > 4.9 3.9 3.7  CL 97*   < > 110 114* 118*  CO2 21*   < > 21* 18* 19*  GLUCOSE 345*   < > 160* 136* 85  BUN 127*   < > 106* 68* 48*  CREATININE 4.07*   < > 2.82* 1.74* 1.58*  CALCIUM 7.7*   < > 7.3* 7.7* 8.1*  PROT 6.6  --   --  5.4*  --   ALBUMIN 2.6*  --   --  2.3*  --   AST 98*  --   --  37  --   ALT 132*  --   --  72*  --   ALKPHOS 268*  --   --  191*  --  BILITOT 0.5  --   --  0.6  --   GFRNONAA 11*   < > 17* 31* 35*  ANIONGAP 13   < > '10 12 10   '$ < > = values in this interval not displayed.     Hematology Recent Labs  Lab 10/05/20 0447 10/06/20 0640 10/07/20 0444  WBC 15.8* 13.3* 12.0*  RBC 3.09* 3.13* 3.22*  HGB 10.0* 10.2* 10.4*  HCT 32.1* 32.9* 34.1*  MCV 103.9* 105.1* 105.9*  MCH 32.4 32.6 32.3  MCHC 31.2 31.0 30.5  RDW 16.4* 16.8* 16.9*  PLT 374 331 309    BNP Recent Labs  Lab 10/04/20 1455  BNP 1,083.7*     DDimer No results for input(s): DDIMER in the last 168 hours.   Radiology    DG Chest 2 View  Result Date: 10/06/2020 CLINICAL DATA:  Acute renal failure, shortness of breath EXAM: CHEST - 2 VIEW COMPARISON:  10/04/2020 FINDINGS: Mild enlargement of cardiac silhouette unchanged. Mediastinal contours and pulmonary vascularity normal. Slight chronic accentuation of interstitial markings. No acute infiltrate, pleural effusion or pneumothorax. Bones demineralized with postsurgical changes of cervical spine fusion and reverse RIGHT shoulder arthroplasty. IMPRESSION: Enlargement of cardiac silhouette. No acute abnormalities. Electronically Signed   By: Lavonia Dana M.D.   On: 10/06/2020 12:38    Cardiac Studies   Echocardiogram 09/16/2020:  1. Septal apical  and anterior wall hypokinesis EF decreased compard to  TTE done 07/10/19 . Left ventricular ejection fraction, by estimation, is  35 to 40%. The left ventricle has moderately decreased function. The left  ventricle demonstrates regional  wall motion abnormalities (see scoring diagram/findings for description).  The left ventricular internal cavity size was moderately dilated. Left  ventricular diastolic parameters are consistent with Grade II diastolic  dysfunction (pseudonormalization).  Elevated left ventricular end-diastolic pressure.   2. Right ventricular systolic function is normal. The right ventricular  size is normal.   3. Left atrial size was mildly dilated.   4. The mitral valve is abnormal. Mild mitral valve regurgitation. No  evidence of mitral stenosis.   5. The aortic valve is tricuspid. There is moderate calcification of the  aortic valve. Aortic valve regurgitation is not visualized. Mild to  moderate aortic valve sclerosis/calcification is present, without any  evidence of aortic stenosis.   6. The inferior vena cava is normal in size with greater than 50%  respiratory variability, suggesting right atrial pressure of 3 mmHg.  Patient Profile     70 y.o. 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 Patient was recently admitted for septic right elbow olecranon bursitis and cellulitis of right hand. Underwent I&D on 09/22/2020. Cardiology was consulted for further evaluation of newly diagnosed cardiomyopathy and she was started on GDMT (Entresto and Bisoprolol). Discharged on 09/30/2020. Readmitted on 10/04/2020 for AKI with creatinine of 4.07. Cardiology following to assist with medication  adjustments   Assessment & Plan    1. Chronic Heart Failure with Newly Reduced EF: Echo on 09/16/2020 showed LVEF of 35-40% with septal apical and anterior wall hypokinesis which are new. EF previously >75% in 2021. BNP elevated at 1,083 (424 on 8/4). Chest x-ray showed no acute findings; repeat 8/18 unchanged. Entresto stopped due to AKI. Weights are stable this  admission.  - Continue Bisoprolol 2.'5mg'$  daily. - No Farxiga for now given recent fungal infection. - Patient will ultimately need an ischemic evaluation  Prior chest CT showed diffuse coronary calcifications. Plan is for outpatient cardiac catheterization once patient recovers from acute infection and pending further improvement in renal function.   2. Hypotensive with History of Hypertension: BP as low as 83/50 upon arrival. Improved with IV fluids, stopped 8/17. - Continue Bisoprolol 2.'5mg'$  daily. - Continue to hold Entresto. - Could consider hydralazine for afterload reduction if BP perks up consistently   3. Hyperlipidemia: Lipid panel this admission: Total Cholesterol 318, Triglycerides 475, HDL 38. LDL could not be calculated due to Triglyceride level. Direct LDL 141. LDL goal <70 given CAD. Lipitor increased to '80mg'$  daily during last admission but she was discharged on '10mg'$  daily. LFTs elevated on admission but may be due to mild shock liver given hypotension; repeat today with improvement in AST/ALT.  - Continue atorvastatin to '80mg'$  daily - Will need repeat FLP/LFTs in 6-8 weeks   4. 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 Xarelto held on admission due to AKI and she was maintained on a heparin gtt. Transitioned back to xarelto 8/18 with improvement in Cr - Continue management per primary team.   5. Acute on CKD Stage III: Creatinine 4.07 on admission but improving with IV fluids 3.23 > 2.82>1.74>1.58 today. Baseline around 1.3 to 1.6. Felt to be pre-renal in nature due to hypotension and Entresto. CT unremarkable.  No obstruction. Entresto held. - Continue IV fluids per primary. - Continue to monior.    6. Hyperkalemia: In setting of AKI. Recevied 2 dose of Lokelma and potassium normalized - 3.7 today  - Continue to monitor.   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 Agree with above.  Clearly came in this admission with acute kidney injury from overdiuresis.  I think this is a situation where her pulmonary status was driving her shortness of breath and not her cardiac status.  Continue to hold diuretics at this point.  Lets be careful in the future with overdiuresis.  She will need close follow-up in the cardiology clinic.  Renal function continues to improve.  I agree that cardiac catheterization at some point to define her anatomy would be appropriate.  This can be handled in the outpatient setting.  With her EF 35 to 40%, would be nice to be on traditional goal-directed therapy however with her renal function this is not plausible.  We will hold off on hydralazine at this time.  Candee Furbish, MD

## 2020-10-07 NOTE — Evaluation (Signed)
Physical Therapy Evaluation Patient Details Name: Ebony Scott MRN: [id number] DOB: Jun 04, 1950 Today's Date: 10/07/2020   History of Present Illness  Ebony Scott is a 70 y.o. 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 Patient s/p Excisional debridement, right olecranon bursa with olecranon bursectomy and irrigation and debridement and excisional debridement, right dorsal rheumatoid nodule, overlying the long metacarpal phalangeal joint.  Pt readmitted 10/04/20 for AKI.  Past medical history significant of ashtma, combined systolic/diastolic HF, PE, tracheobronchomalacia.  Clinical Impression  Pt admitted with above diagnosis. Pt currently with functional limitations due to the deficits listed below (see PT Problem List). Pt will benefit from skilled PT to increase their independence and safety with mobility to allow discharge to the venue listed below.  Pt reports discharging to SNF and then not receiving any care or therapy.  Pt then readmitted for AKI.  Pt states she has not been OOB in at least 4 days so assisted pt with standing and transferring to recliner.  Pt would still benefit from d/c to SNF for rehab as she does not have 24/7 assist home.  Pt hopeful to progress towards home however.     Follow Up Recommendations SNF;Supervision/Assistance - 24 hour    Equipment Recommendations  None recommended by PT    Recommendations for Other Services       Precautions / Restrictions Precautions Precautions: Fall Required Braces or Orthoses: Other Brace Splint/Cast: per ortho note 8/17: discontinued splint Other Brace: pt has right post op shoe and sneaker for left foot Restrictions Weight Bearing Restrictions: No RUE Weight Bearing: Weight bearing as tolerated Other Position/Activity Restrictions: per ortho note 8/17: WBAT R UE      Mobility  Bed Mobility Overal bed mobility: Needs Assistance Bed Mobility: Supine to Sit      Supine to sit: Min assist     General bed mobility comments: slight assist for scooting to EOB    Transfers Overall transfer level: Needs assistance Equipment used: Rolling walker (2 wheeled) Transfers: Sit to/from [facility] Sit to Stand: Min assist Stand pivot transfers: Min assist       General transfer comment: assist for rise and steady, pt only felt able to take a few steps over to recliner since she hasn't been OOB in over 4 days (per pt report), reports dyspnea with activity as well, first pulse ox not working but second pulse ox SPO2 reading 96% on room air  Ambulation/Gait                Stairs            Wheelchair Mobility    Modified Rankin (Stroke Patients Only)       Balance Overall balance assessment: Needs assistance         Standing balance support: Bilateral upper extremity supported Standing balance-Leahy Scale: Poor Standing balance comment: reliant on UE support                             Pertinent Vitals/Pain Pain Assessment: Faces Faces Pain Scale: Hurts little more Pain Location: R UE Pain Descriptors / Indicators: Discomfort Pain Intervention(s): Repositioned;Monitored during session    Home Living Family/patient expects to be discharged to:: Skilled nursing facility Living Arrangements: Children Available Help at Discharge: Family;Available PRN/intermittently (daughter lives in basement, night RN) Type of Home: House Home Access: Stairs to enter Entrance Stairs-Rails: Chemical engineer  of Steps: 4 Home Layout: Two level;Able to live on main level with bedroom/bathroom Home Equipment: Bedside commode;Walker - 2 wheels;Cane - single point Additional Comments: Daughter is an Therapist, sports that works night shift.    Prior Function Level of Independence: Independent with assistive device(s) (prior to recent admission, admitted from SNF)         Comments: enjoys Gospel music;  crossword puzzles     Journalist, newspaper        Extremity/Trunk Assessment   Upper Extremity Assessment Upper Extremity Assessment: Defer to OT evaluation    Lower Extremity Assessment Lower Extremity Assessment: Generalized weakness;RLE deficits/detail RLE Deficits / Details: recent R foot surgery, wearing post op shoe still    Cervical / Trunk Assessment Cervical / Trunk Assessment: Kyphotic  Communication   Communication: No difficulties  Cognition Arousal/Alertness: Awake/alert Behavior During Therapy: Flat affect;WFL for tasks assessed/performed Overall Cognitive Status: Within Functional Limits for tasks assessed                                        General Comments      Exercises     Assessment/Plan    PT Assessment Patient needs continued PT services  PT Problem List Decreased strength;Decreased balance;Decreased knowledge of precautions;Decreased mobility;Decreased knowledge of use of DME;Decreased activity tolerance;Decreased safety awareness;Decreased skin integrity       PT Treatment Interventions DME instruction;Therapeutic activities;Gait training;Therapeutic exercise;Patient/family education;Stair training;Functional mobility training;Balance training    PT Goals (Current goals can be found in the Care Plan section)  Acute Rehab PT Goals PT Goal Formulation: With patient Time For Goal Achievement: 10/21/20 Potential to Achieve Goals: Good    Frequency Min 2X/week   Barriers to discharge        Co-evaluation               AM-PAC PT "6 Clicks" Mobility  Outcome Measure Help needed turning from your back to your side while in a flat bed without using bedrails?: A Little Help needed moving from lying on your back to sitting on the side of a flat bed without using bedrails?: A Little Help needed moving to and from a bed to a chair (including a wheelchair)?: A Little Help needed standing up from a chair using your arms (e.g.,  wheelchair or bedside chair)?: A Little Help needed to walk in hospital room?: A Lot Help needed climbing 3-5 steps with a railing? : A Lot 6 Click Score: 16    End of Session Equipment Utilized During Treatment: Gait belt Activity Tolerance: Patient limited by fatigue Patient left: in chair;with call bell/phone within reach;with chair alarm set;with family/visitor present   PT Visit Diagnosis: Unsteadiness on feet (R26.81);Difficulty in walking, not elsewhere classified (R26.2);Muscle weakness (generalized) (M62.81)    Time: [id number] PT Time Calculation (min) (ACUTE ONLY): 19 min   Charges:   PT Evaluation $PT Eval Low Complexity: 1 Low         Kati PT, DPT Acute Rehabilitation Services Pager: [phone] Office: [phone]   York Ram E 10/07/2020, 2:05 PM

## 2020-10-07 NOTE — TOC Initial Note (Addendum)
Transition of Care [facility]) - Initial/Assessment Note    Patient Details  Name: Ebony Scott MRN: [id number] Date of Birth: 1950/06/14  Transition of [facility]) CM/SW Contact:    Dessa Phi, RN Phone Number: 10/07/2020, 11:44 AM  Clinical Narrative: From [facility]-SNF rep Janie aware.Spoke to patient about d/c plans-prefer to go to a different SNF at d/c if recc or home w/HHC. Will await PT/OT recc.prior auth,will need covid test.   4p-fl2 faxed out-patient wants a new SNF-await bed offers.                Expected Discharge Plan: Skilled Nursing Facility Barriers to Discharge: Continued Medical Work up   Patient Goals and CMS Choice Patient states their goals for this hospitalization and ongoing recovery are:: go to a different SNF or home. CMS Medicare.gov Compare Post Acute Care list provided to:: Patient Choice offered to / list presented to : Patient  Expected Discharge Plan and Services Expected Discharge Plan: [place]   Discharge Planning Services: CM Consult   Living arrangements for the past 2 months: Single Family Home                                      Prior Living Arrangements/Services Living arrangements for the past 2 months: Single Family Home Lives with:: Adult Children Patient language and need for interpreter reviewed:: Yes Do you feel safe going back to the place where you live?: Yes      Need for Family Participation in Patient Care: No (Comment) Care giver support system in place?: Yes (comment) Current home services: DME (rw,cane,3n1) Criminal Activity/Legal Involvement Pertinent to Current Situation/Hospitalization: No - Comment as needed  Activities of Daily Living Home Assistive Devices/Equipment: Walker (specify type), CBG Meter, Nebulizer, Brace (specify type) (4 wheeled walker, left cam boot) ADL Screening (condition at time of admission) Patient's cognitive ability adequate to safely complete daily activities?:  Yes Is the patient deaf or have difficulty hearing?: No Does the patient have difficulty seeing, even when wearing glasses/contacts?: No Does the patient have difficulty concentrating, remembering, or making decisions?: No Patient able to express need for assistance with ADLs?: Yes Does the patient have difficulty dressing or bathing?: Yes Independently performs ADLs?: No Communication: Independent Dressing (OT): Needs assistance Is this a change from baseline?: Pre-admission baseline Grooming: Needs assistance Is this a change from baseline?: Pre-admission baseline Feeding: Needs assistance Is this a change from baseline?: Pre-admission baseline Bathing: Needs assistance Is this a change from baseline?: Pre-admission baseline Toileting: Independent with device (comment) In/Out Bed: Needs assistance Is this a change from baseline?: Pre-admission baseline Walks in Home: Needs assistance Is this a change from baseline?: Pre-admission baseline Does the patient have difficulty walking or climbing stairs?: Yes (secondary to weakness) Weakness of Legs: Both Weakness of Arms/Hands: Right  Permission Sought/Granted Permission sought to share information with : Case Manager Permission granted to share information with : Yes, Verbal Permission Granted  Share Information with NAME: Case manager     Permission granted to share info w Relationship: [name] dtr [phone]     Emotional Assessment Appearance:: Appears stated age   Affect (typically observed): Accepting Orientation: : Oriented to Self, Oriented to Place, Oriented to  Time, Oriented to Situation Alcohol / Substance Use: Not Applicable Psych Involvement: No (comment)  Admission diagnosis:  Hyperkalemia [E87.5] ARF (acute renal failure) (HCC) [N17.9] AKI (acute kidney injury) ([place]) [  N17.9] Hypotension, unspecified hypotension type [I95.9] Patient Active Problem List   Diagnosis Date Noted   ARF (acute renal failure) ([place])  10/04/2020   Physical deconditioning 09/25/2020   History of DVT (deep vein thrombosis) 09/24/2020   Osteomyelitis of second toe of right foot ([place]) 09/23/2020   DMII (diabetes mellitus, type 2) ([place]) 09/23/2020   Cellulitis of right hand 09/22/2020   Septic olecranon bursitis of right elbow 09/22/2020   Abscess of dorsum of right hand 0000000   Metabolic encephalopathy [id number]   Acute encephalopathy    AMS (altered mental status) 02/23/2020   S/P reverse total shoulder arthroplasty, right    Hypokalemia    DOE (dyspnea on exertion) 02/18/2020   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   S/P reverse total shoulder arthroplasty, left 02/16/2020   Preoperative clearance 01/26/2020   Closed displaced fracture of metatarsal bone of right foot 01/20/2019   History of pulmonary embolus (PE) 12/19/2018   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   Rheumatoid arthritis ([place])    Bilateral impacted cerumen 08/18/2018   Fungal otitis externa 08/18/2018   Asthmatic bronchitis with exacerbation 07/29/2018   Pulmonary embolism ([place]) 07/29/2018   Cellulitis of forearm    Fever of unknown origin (FUO) 02/10/2018   Abdominal pain 02/10/2018   Fracture of base of fifth metatarsal bone with routine healing, left    Closed fracture of fifth metatarsal bone of right foot, initial encounter 01/15/2018   DVT (deep venous thrombosis) ([place]) 01/14/2018   Hypothyroidism 01/14/2018   Depression 01/14/2018   Debility    Malnutrition of moderate degree 12/17/2017   Knee pain    History of breast cancer    Chronic pain syndrome    Fibromyalgia    Graves disease    Sepsis due to undetermined organism ([place]) 12/13/2017   Rhabdomyolysis 12/13/2017   Dysphonia 08/10/2017   Pulmonary nodules    Chronic kidney disease, stage 3b ([place]) 04/15/2016   Hx of adenomatous colonic polyps 04/12/2016   Cough 02/09/2016   Opacity of lung on imaging study 11/03/2015   Severe persistent asthma 02/08/2015   Facial pain  syndrome 02/08/2015   Insomnia 02/08/2015   Other allergic rhinitis 02/08/2015   LPRD (laryngopharyngeal reflux disease) 02/08/2015   Chronic combined systolic and diastolic CHF (congestive heart failure) ([place]) 02/08/2015   Benign paroxysmal positional vertigo 12/03/2012   Dizziness and giddiness 10/29/2012   Disequilibrium 10/29/2012   Pseudogout 02/24/2012   Pneumonia 05/31/2011   Irritable bowel syndrome 01/02/2010   Hyperlipidemia 12/23/2009   Hyperthyroidism 11/12/2006   Seasonal and perennial allergic rhinitis 11/12/2006   GERD 11/12/2006   NECK PAIN, CHRONIC 11/12/2006   OSTEOPOROSIS 11/12/2006   BREAST CANCER, HX OF 11/12/2006   PCP:  [name], MD Pharmacy:   [place], [place] - [address] [place] [address] Phone: [phone] Fax: [phone]     Social Determinants of Health (SDOH) Interventions    Readmission Risk Interventions Readmission Risk Prevention Plan 09/26/2020  Transportation Screening Complete  Medication Review (RN Care Manager) Complete  PCP or Specialist appointment within 3-5 days of discharge Complete  HRI or [place] Complete  SW Recovery Care/Counseling Consult Complete  Palliative Care Screening Not [place] Not Applicable  Some recent data might be hidden

## 2020-10-08 DIAGNOSIS — I5042 Chronic combined systolic (congestive) and diastolic (congestive) heart failure: Secondary | ICD-10-CM | POA: Diagnosis not present

## 2020-10-08 LAB — GLUCOSE, CAPILLARY
Glucose-Capillary: 96 mg/dL (ref 70–99)
Glucose-Capillary: 109 mg/dL — ABNORMAL HIGH (ref 70–99)
Glucose-Capillary: 185 mg/dL — ABNORMAL HIGH (ref 70–99)
Glucose-Capillary: 169 mg/dL — ABNORMAL HIGH (ref 70–99)

## 2020-10-08 LAB — BASIC METABOLIC PANEL
Anion gap: 11 (ref 5–15)
BUN: 34 mg/dL — ABNORMAL HIGH (ref 8–23)
CO2: 19 mmol/L — ABNORMAL LOW (ref 22–32)
Calcium: 8.3 mg/dL — ABNORMAL LOW (ref 8.9–10.3)
Chloride: 114 mmol/L — ABNORMAL HIGH (ref 98–111)
Creatinine, Ser: 1.33 mg/dL — ABNORMAL HIGH (ref 0.44–1.00)
GFR, Estimated: 43 mL/min — ABNORMAL LOW (ref >60)
Glucose, Bld: 83 mg/dL (ref 70–99)
Potassium: 3.9 mmol/L (ref 3.5–5.1)
Sodium: 144 mmol/L (ref 135–145)

## 2020-10-08 LAB — CBC
HCT: 34.3 % — ABNORMAL LOW (ref 36.0–46.0)
Hemoglobin: 10.3 g/dL — ABNORMAL LOW (ref 12.0–15.0)
MCH: 32.4 pg (ref 26.0–34.0)
MCHC: 30 g/dL (ref 30.0–36.0)
MCV: 107.9 fL — ABNORMAL HIGH (ref 80.0–100.0)
Platelets: 285 10*3/uL (ref 150–400)
RBC: 3.18 MIL/uL — ABNORMAL LOW (ref 3.87–5.11)
RDW: 17.2 % — ABNORMAL HIGH (ref 11.5–15.5)
WBC: 14.9 10*3/uL — ABNORMAL HIGH (ref 4.0–10.5)
nRBC: 0.4 % — ABNORMAL HIGH (ref 0.0–0.2)

## 2020-10-08 MED ORDER — ISOSORB DINITRATE-HYDRALAZINE 20-37.5 MG PO TABS
0.5000 | ORAL_TABLET | Freq: Two times a day (BID) | ORAL | Status: DC
  Administered 2020-10-08 (×2): 0.5 via ORAL
  Filled 2020-10-08 (×3): qty 0.5

## 2020-10-08 NOTE — Progress Notes (Signed)
Attempted x 2 to perform wound care to R hand and elbow, patient refused stating to "do it later"

## 2020-10-08 NOTE — Progress Notes (Addendum)
[facility]  PROGRESS NOTE  SALLYE HENNER [id number] DOB: 1950-06-02 DOA: 10/04/2020 PCP: [name], MD   Brief HPI:   70 year old female with history of diffuse aorta and coronary calcifications noted on chest CT on 07/2017, chronic combined CHF with a EF of 35 to 40% on recent echo, SVT, prior PE on lifelong anticoagulation, asthma, tracheobronchomalacia on CPAP, bronchiectasis, vocal cord dysfunction, seronegative rheumatoid arthritis, CKD stage III who was recently admitted to the hospital for septic right elbow olecranon bursitis and cellulitis of right hand.  She underwent incision and drainage on 09/22/2020.  Cardiology was consulted, and she was started on Entresto and bisoprolol.  Discharged on 09/30/2020.  Patient was readmitted with AKI, creatinine of 4.07.  Her creatinine was 1.8 on discharge on 11th August.    Subjective  Ongoing dyspnea but not able to elaborate if at rest or with exertion.  Reports right second toe pain and indicates that it has been fractured.  No chest pain reported.   Assessment/Plan:    Acute on chronic kidney disease stage III A -Baseline creatinine is unclear and fluctuating.  Earlier this year she had creatinines ranging in the 1.1-1.4 range.  She even had creatinine of 1.22 on 09/28/2020. -Presented with creatinine of 4.07; likely from [place] and hypotension/ATN -[name] is currently on hold -Started on IV normal saline; creatinine improved to 1.58 >1.33-back to baseline. -Colchicine also on hold. -Appears somewhat volume overloaded.  However continue to hold Lasix at this time.  DC IV fluids.  Hypernatremia Secondary to dehydration.  Resolved.  Chronic combined systolic and diastolic CHF -Last EF was 35 to 40% in July 2022 -Entresto and bisoprolol were held -Cardiology has restarted bisoprolol, BiDil added.  [name] is still on hold -Diuretics on hold -Appears slightly peripherally volume overloaded but respiratory wise stable  despite some reported dyspnea. -Could consider resumption of small dose of diuretics prior to discharge.  Cardiology continues to follow.  COPD exacerbation -Continue Brovana, Incruse Ellipta -As needed albuterol nebulizer -No clinical bronchospasm today.  Diabetes mellitus type 2 -Hemoglobin A1c was 8.1, 2 weeks ago -CBGs elevated due to steroids -Started sliding scale insulin with NovoLog -CBGs now well controlled.  History of rheumatoid arthritis -Patient is on prednisone and Arava at home -Started on Solu-Cortef stress dose 50 mg every 8 hours -Solu-Cortef has been weaned off -Patient takes prednisone 15 mg daily at home which was increased to 20 mg daily, unclear reason-defer to Dr. Darrick Meigs who will assume care tomorrow.  History of DVT/PE -Patient is on Xarelto at home -Started on heparin per [place] has been restarted  Recent septic olecranon bursitis -S/p debridement during last admission -Culture grew Candida albicans; started on fluconazole till October 20, 2020  Anemia, suspected due to chronic disease Stable.  Abnormal LFTs Unclear etiology.  Better on 8/18 compared to 8/16.  Follow-up CMP in AM.  Right second toe pain Unclear etiology.  X-ray on 8/10 showed no acute findings of the right foot.  Chronic fracture of the base of the fourth and fifth metatarsals.  Macrocytic anemia/suspecting anemia of chronic disease Stable.   Scheduled medications:    allopurinol  100 mg Oral Daily   arformoterol  15 mcg Nebulization BID   And   umeclidinium bromide  1 puff Inhalation Daily   atorvastatin  80 mg Oral Daily   bisoprolol  2.5 mg Oral Daily   cyproheptadine  8 mg Oral QPM   fluconazole  200 mg Oral Daily   folic  acid  1 mg Oral Daily   gabapentin  300 mg Oral QHS   insulin aspart  0-9 Units Subcutaneous TID WC   isosorbide-hydrALAZINE  0.5 tablet Oral BID   loratadine  10 mg Oral Daily   magnesium oxide  400 mg Oral Daily   mirtazapine  30 mg  Oral QHS   montelukast  10 mg Oral Daily   pantoprazole  40 mg Oral Daily   predniSONE  20 mg Oral Q breakfast   rivaroxaban  15 mg Oral Daily         Data Reviewed:   CBG:  Recent Labs  Lab 10/07/20 1132 10/07/20 1727 10/07/20 2213 10/08/20 0712 10/08/20 1140  GLUCAP 195* 107* 86 96 109*    SpO2: (!) 89 % O2 Flow Rate (L/min): 2 L/min    Vitals:   10/07/20 1939 10/08/20 0623 10/08/20 0744 10/08/20 1326  BP:  (!) 139/54  139/60  Pulse: 74 82  74  Resp: '18 20  18  '$ Temp:  98 F (36.7 C)  98.7 F (37.1 C)  TempSrc:      SpO2: 98%  98% (!) 89%  Weight:  59.7 kg    Height:         Intake/Output Summary (Last 24 hours) at 10/08/2020 1421 Last data filed at 10/08/2020 1027 Gross per 24 hour  Intake 0 ml  Output 600 ml  Net -600 ml    08/18 1901 - 08/20 0700 In: 930 [P.O.:930] Out: 600 [Urine:600]  Filed Weights   10/06/20 2033 10/07/20 0500 10/08/20 0623  Weight: 60.4 kg 59.8 kg 59.7 kg    CBC:  Recent Labs  Lab 10/04/20 1455 10/05/20 0447 10/06/20 0640 10/07/20 0444 10/08/20 0457  WBC 15.8* 15.8* 13.3* 12.0* 14.9*  HGB 11.3* 10.0* 10.2* 10.4* 10.3*  HCT 35.8* 32.1* 32.9* 34.1* 34.3*  PLT 372 374 331 309 285  MCV 104.1* 103.9* 105.1* 105.9* 107.9*  MCH 32.8 32.4 32.6 32.3 32.4  MCHC 31.6 31.2 31.0 30.5 30.0  RDW 16.3* 16.4* 16.8* 16.9* 17.2*  LYMPHSABS 0.5*  --   --   --   --   MONOABS 1.0  --   --   --   --   EOSABS 0.0  --   --   --   --   BASOSABS 0.1  --   --   --   --     Complete metabolic panel:  Recent Labs  Lab 10/04/20 1455 10/04/20 2257 10/05/20 0447 10/06/20 0640 10/07/20 0444 10/08/20 0457  NA 131* 136 141 144 147* 144  K 6.0* 5.8* 4.9 3.9 3.7 3.9  CL 97* 103 110 114* 118* 114*  CO2 21* 21* 21* 18* 19* 19*  GLUCOSE 345* 274* 160* 136* 85 83  BUN 127* 89* 106* 68* 48* 34*  CREATININE 4.07* 3.23* 2.82* 1.74* 1.58* 1.33*  CALCIUM 7.7* 7.4* 7.3* 7.7* 8.1* 8.3*  AST 98*  --   --  37  --   --   ALT 132*  --   --  72*   --   --   ALKPHOS 268*  --   --  191*  --   --   BILITOT 0.5  --   --  0.6  --   --   ALBUMIN 2.6*  --   --  2.3*  --   --   MG 3.1*  --   --   --   --   --   INR  --  1.6*  --   --   --   --   BNP 1,083.7*  --   --   --   --   --      Recent Labs  Lab 10/04/20 1455 10/04/20 1724  BNP 1,083.7*  --   SARSCOV2NAA  --  NEGATIVE      Lab Results  Component Value Date   HGBA1C 8.1 (H) 09/22/2020    Coagulation profile Recent Labs  Lab 10/04/20 2257  INR 1.6*       Component Value Date/Time   BNP 1,083.7 (H) 10/04/2020 1455   BNP 494 (H) 08/25/2020 1435     Antibiotics: Anti-infectives (From admission, onward)    Start     Dose/Rate Route Frequency Ordered Stop   10/05/20 1000  fluconazole (DIFLUCAN) tablet 200 mg        200 mg Oral Daily 10/04/20 2106          Radiology Reports  No results found.    DVT prophylaxis: Xarelto  Code Status: DNR  Family Communication: No family at bedside   Consultants: Cardiology  Procedures: None    Objective    Physical Examination:  General exam: Elderly female, small built, frail and chronically ill looking lying supine in bed.  Moon facies. Respiratory system: Clear to auscultation.  No increased work of breathing.  Able to speak without difficulty. Cardiovascular system: S1 & S2 heard, RRR. No JVD, murmurs, rubs, gallops or clicks. No pedal edema.  Left forearm/hand minimal edema.  Telemetry personally reviewed: Sinus rhythm. Gastrointestinal system: Abdomen is nondistended, soft and nontender. No organomegaly or masses felt. Normal bowel sounds heard. Central nervous system: Alert and oriented. No focal neurological deficits. Extremities: Symmetric 5 x 5 power.  Right hand dressing.  Removed right foot dressing.  Right second toe with likely ecchymosis. Skin: No rashes, lesions or ulcers Psychiatry: Judgement and insight appear normal. Mood & affect appropriate.   Status is: Inpatient  Dispo: The  patient is from: Skilled nursing facility              Anticipated d/c is to: Skilled nursing facility              Anticipated d/c date is: 10/10/2020              Patient currently not stable for discharge  Barrier to discharge-acute kidney injury  COVID-19 Labs  No results for input(s): DDIMER, FERRITIN, LDH, CRP in the last 72 hours.  Lab Results  Component Value Date   [place] NEGATIVE 10/04/2020   [place] NEGATIVE 09/30/2020   [place] NEGATIVE 09/22/2020   [place] NEGATIVE 04/02/2020    Microbiology  Recent Results (from the past 240 hour(s))  Resp Panel by RT-PCR (Flu A&B, Covid) Nasopharyngeal Swab     Status: None   Collection Time: 09/30/20 10:20 AM   Specimen: Nasopharyngeal Swab; Nasopharyngeal(NP) swabs in vial transport medium  Result Value Ref Range Status   SARS Coronavirus 2 by RT PCR NEGATIVE NEGATIVE Final    Comment: (NOTE) SARS-CoV-2 target nucleic acids are NOT DETECTED.  The SARS-CoV-2 RNA is generally detectable in upper respiratory specimens during the acute phase of infection. The lowest concentration of SARS-CoV-2 viral copies this assay can detect is 138 copies/mL. A negative result does not preclude SARS-Cov-2 infection and should not be used as the sole basis for treatment or other patient management decisions. A negative result may occur with  improper specimen collection/handling, submission of specimen other than nasopharyngeal swab,  presence of viral mutation(s) within the areas targeted by this assay, and inadequate number of viral copies(<138 copies/mL). A negative result must be combined with clinical observations, patient history, and epidemiological information. The expected result is Negative.  Fact Sheet for Patients:  EntrepreneurPulse.com.au  Fact Sheet for Healthcare Providers:  IncredibleEmployment.be  This test is no t yet approved or cleared by the [place] FDA and   has been authorized for detection and/or diagnosis of SARS-CoV-2 by FDA under an Emergency Use Authorization (EUA). This EUA will remain  in effect (meaning this test can be used) for the duration of the COVID-19 declaration under Section 564(b)(1) of the Act, 21 U.S.C.section 360bbb-3(b)(1), unless the authorization is terminated  or revoked sooner.       Influenza A by PCR NEGATIVE NEGATIVE Final   Influenza B by PCR NEGATIVE NEGATIVE Final    Comment: (NOTE) The Xpert Xpress SARS-CoV-2/FLU/RSV plus assay is intended as an aid in the diagnosis of influenza from Nasopharyngeal swab specimens and should not be used as a sole basis for treatment. Nasal washings and aspirates are unacceptable for Xpert Xpress SARS-CoV-2/FLU/RSV testing.  Fact Sheet for Patients: EntrepreneurPulse.com.au  Fact Sheet for Healthcare Providers: IncredibleEmployment.be  This test is not yet approved or cleared by the [place] FDA and has been authorized for detection and/or diagnosis of SARS-CoV-2 by FDA under an Emergency Use Authorization (EUA). This EUA will remain in effect (meaning this test can be used) for the duration of the COVID-19 declaration under Section 564(b)(1) of the Act, 21 U.S.C. section 360bbb-3(b)(1), unless the authorization is terminated or revoked.  Performed at [facility], [place] [address] [place], [place] [address]   SARS CORONAVIRUS 2 (TAT 6-24 HRS) Nasopharyngeal Nasopharyngeal Swab     Status: None   Collection Time: 10/04/20  5:24 PM   Specimen: Nasopharyngeal Swab  Result Value Ref Range Status   SARS Coronavirus 2 NEGATIVE NEGATIVE Final    Comment: (NOTE) SARS-CoV-2 target nucleic acids are NOT DETECTED.  The SARS-CoV-2 RNA is generally detectable in upper and lower respiratory specimens during the acute phase of infection. Negative results do not preclude SARS-CoV-2 infection, do not rule  out co-infections with other pathogens, and should not be used as the sole basis for treatment or other patient management decisions. Negative results must be combined with clinical observations, patient history, and epidemiological information. The expected result is Negative.  Fact Sheet for Patients: SugarRoll.be  Fact Sheet for Healthcare Providers: https://www.woods-mathews.com/  This test is not yet approved or cleared by the [place] FDA and  has been authorized for detection and/or diagnosis of SARS-CoV-2 by FDA under an Emergency Use Authorization (EUA). This EUA will remain  in effect (meaning this test can be used) for the duration of the COVID-19 declaration under Se ction 564(b)(1) of the Act, 21 U.S.C. section 360bbb-3(b)(1), unless the authorization is terminated or revoked sooner.  Performed at [facility], [place] [address], [place], [place] [address]         Vernell Leep, MD, [place], [facility]. [facility]  To contact the attending provider between 7A-7P or the covering provider during after hours 7P-7A, please log into the web site [email or web address] and access using universal [place] password for that web site. If you do not have the password, please call the hospital operator.    10/08/2020, 2:21 PM  LOS: 4 days

## 2020-10-08 NOTE — Plan of Care (Signed)
  Problem: Health Behavior/Discharge Planning: Goal: Ability to manage health-related needs will improve Outcome: Not Progressing   

## 2020-10-08 NOTE — Progress Notes (Signed)
Progress Note  Patient Name: Ebony Scott Date of Encounter: 10/08/2020  [facility] Cardiologist: Buford Dresser, MD   Profile     70 y.o. 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 Patient was recently admitted for septic right elbow olecranon bursitis and cellulitis of right hand. Underwent I&D on 09/22/2020. Cardiology was consulted for further evaluation of newly diagnosed cardiomyopathy and she was started on GDMT (Entresto and Bisoprolol). Discharged on 09/30/2020. Readmitted on 10/04/2020 for AKI with creatinine of 4.07 and hypotension  Cr>> 1.3  Subjective   Feels better.  Still some shortness of breath.  Chronic cough.  Inpatient Medications    Scheduled Meds:  allopurinol  100 mg Oral Daily   arformoterol  15 mcg Nebulization BID   And   umeclidinium bromide  1 puff Inhalation Daily   atorvastatin  80 mg Oral Daily   bisoprolol  2.5 mg Oral Daily   cyproheptadine  8 mg Oral QPM   fluconazole  200 mg Oral Daily   folic acid  1 mg Oral Daily   gabapentin  300 mg Oral QHS   insulin aspart  0-9 Units Subcutaneous TID WC   loratadine  10 mg Oral Daily   magnesium oxide  400 mg Oral Daily   mirtazapine  30 mg Oral QHS   montelukast  10 mg Oral Daily   pantoprazole  40 mg Oral Daily   predniSONE  20 mg Oral Q breakfast   rivaroxaban  15 mg Oral Daily   Continuous Infusions:  PRN Meds: acetaminophen **OR** acetaminophen, albuterol, guaiFENesin-dextromethorphan, traMADol   Vital Signs    Vitals:   10/07/20 1930 10/07/20 1939 10/08/20 0623 10/08/20 0744  BP: 137/63  (!) 139/54   Pulse: 64 74 82   Resp: '20 18 20   '$ Temp: 97.9 F (36.6  C)  98 F (36.7 C)   TempSrc: Oral     SpO2: 96% 98%  98%  Weight:   59.7 kg   Height:        Intake/Output Summary (Last 24 hours) at 10/08/2020 0816 Last data filed at 10/08/2020 0600 Gross per 24 hour  Intake 240 ml  Output 600 ml  Net -360 ml    Last 3 Weights 10/08/2020 10/07/2020 10/06/2020  Weight (lbs) 131 lb 9.8 oz 131 lb 13.4 oz 133 lb 2.5 oz  Weight (kg) 59.7 kg 59.8 kg 60.4 kg      Telemetry    Telemetry Personally reviewed sinus  ECG    No new tracings - Personally Reviewed  Physical Exam  Well developed and nourished in no acute distress HENT normal Neck supple with   Clear Regular rate and rhythm, no murmurs or gallops Abd-soft with active BS No Clubbing cyanosis edema Skin-warm and dry A & Oriented  Grossly normal sensory and motor function    Labs    High Sensitivity Troponin:  No results for input(s): TROPONINIHS in the last 720 hours.    Chemistry Recent Labs  Lab 10/04/20 1455 10/04/20 2257 10/06/20 0640 10/07/20 0444 10/08/20 0457  NA 131*   < > 144 147* 144  K 6.0*   < > 3.9 3.7 3.9  CL 97*   < > 114*  118* 114*  CO2 21*   < > 18* 19* 19*  GLUCOSE 345*   < > 136* 85 83  BUN 127*   < > 68* 48* 34*  CREATININE 4.07*   < > 1.74* 1.58* 1.33*  CALCIUM 7.7*   < > 7.7* 8.1* 8.3*  PROT 6.6  --  5.4*  --   --   ALBUMIN 2.6*  --  2.3*  --   --   AST 98*  --  37  --   --   ALT 132*  --  72*  --   --   ALKPHOS 268*  --  191*  --   --   BILITOT 0.5  --  0.6  --   --   GFRNONAA 11*   < > 31* 35* 43*  ANIONGAP 13   < > '12 10 11   '$ < > = values in this interval not displayed.      Hematology Recent Labs  Lab 10/06/20 0640 10/07/20 0444 10/08/20 0457  WBC 13.3* 12.0* 14.9*  RBC 3.13* 3.22* 3.18*  HGB 10.2* 10.4* 10.3*  HCT 32.9* 34.1* 34.3*  MCV 105.1* 105.9* 107.9*  MCH 32.6 32.3 32.4  MCHC 31.0 30.5 30.0  RDW 16.8* 16.9* 17.2*  PLT 331 309 285     BNP Recent Labs  Lab 10/04/20 1455  BNP 1,083.7*      DDimer No results  for input(s): DDIMER in the last 168 hours.   Radiology    DG Chest 2 View  Result Date: 10/06/2020 CLINICAL DATA:  Acute renal failure, shortness of breath EXAM: CHEST - 2 VIEW COMPARISON:  10/04/2020 FINDINGS: Mild enlargement of cardiac silhouette unchanged. Mediastinal contours and pulmonary vascularity normal. Slight chronic accentuation of interstitial markings. No acute infiltrate, pleural effusion or pneumothorax. Bones demineralized with postsurgical changes of cervical spine fusion and reverse RIGHT shoulder arthroplasty. IMPRESSION: Enlargement of cardiac silhouette. No acute abnormalities. Electronically Signed   By: Lavonia Dana M.D.   On: 10/06/2020 12:38    Cardiac Studies   Echocardiogram 09/16/2020:  1. Septal apical and anterior wall hypokinesis EF decreased compard to  TTE done 07/10/19 . Left ventricular ejection fraction, by estimation, is  35 to 40%. The left ventricle has moderately decreased function. The left  ventricle demonstrates regional  wall motion abnormalities (see scoring diagram/findings for description).  The left ventricular internal cavity size was moderately dilated. Left  ventricular diastolic parameters are consistent with Grade II diastolic  dysfunction (pseudonormalization).  Elevated left ventricular end-diastolic pressure.   2. Right ventricular systolic function is normal. The right ventricular  size is normal.   3. Left atrial size was mildly dilated.   4. The mitral valve is abnormal. Mild mitral valve regurgitation. No  evidence of mitral stenosis.   5. The aortic valve is tricuspid. There is moderate calcification of the  aortic valve. Aortic valve regurgitation is not visualized. Mild to  moderate aortic valve sclerosis/calcification is present, without any  evidence of aortic stenosis.   6. The inferior vena cava is normal in size with greater than 50%  respiratory variability, suggesting right atrial pressure of 3 mmHg.      Assessment & Plan    Congestive heart failure acute-systolic  Acute renal injury-associated with overdiuresis and hypotension improved following volume repletion  Prior PE/DVT on lifelong anticoagulation  Septic bursitis secondary to Candida albicans with a right hand abscess; history of osteomyelitis  Rheumatoid arthritis  Tracheobronchomalacia  Prior intolerance of Entresto  with overdiuresis should not preclude the use of ACE/ARB/ASNI/MRA in the future.   But for now, we will confine her self to bisoprolol appointment with me and would begin bidil today at low dose         Continue atorvastatin       For questions or updates, please contact [place] Please consult [email or web address] for contact info under        Signed, Virl Axe, MD  10/08/2020, 8:16 AM

## 2020-10-09 DIAGNOSIS — I5042 Chronic combined systolic (congestive) and diastolic (congestive) heart failure: Secondary | ICD-10-CM | POA: Diagnosis not present

## 2020-10-09 DIAGNOSIS — R0600 Dyspnea, unspecified: Secondary | ICD-10-CM

## 2020-10-09 DIAGNOSIS — I429 Cardiomyopathy, unspecified: Secondary | ICD-10-CM | POA: Diagnosis not present

## 2020-10-09 LAB — COMPREHENSIVE METABOLIC PANEL
ALT: 50 U/L — ABNORMAL HIGH (ref 0–44)
AST: 35 U/L (ref 15–41)
Albumin: 2.4 g/dL — ABNORMAL LOW (ref 3.5–5.0)
Alkaline Phosphatase: 154 U/L — ABNORMAL HIGH (ref 38–126)
Anion gap: 8 (ref 5–15)
BUN: 29 mg/dL — ABNORMAL HIGH (ref 8–23)
CO2: 21 mmol/L — ABNORMAL LOW (ref 22–32)
Calcium: 8.6 mg/dL — ABNORMAL LOW (ref 8.9–10.3)
Chloride: 113 mmol/L — ABNORMAL HIGH (ref 98–111)
Creatinine, Ser: 1.28 mg/dL — ABNORMAL HIGH (ref 0.44–1.00)
GFR, Estimated: 45 mL/min — ABNORMAL LOW (ref >60)
Glucose, Bld: 105 mg/dL — ABNORMAL HIGH (ref 70–99)
Potassium: 4.1 mmol/L (ref 3.5–5.1)
Sodium: 142 mmol/L (ref 135–145)
Total Bilirubin: 0.7 mg/dL (ref 0.3–1.2)
Total Protein: 5.8 g/dL — ABNORMAL LOW (ref 6.5–8.1)

## 2020-10-09 LAB — CBC
HCT: 32 % — ABNORMAL LOW (ref 36.0–46.0)
Hemoglobin: 9.8 g/dL — ABNORMAL LOW (ref 12.0–15.0)
MCH: 32.5 pg (ref 26.0–34.0)
MCHC: 30.6 g/dL (ref 30.0–36.0)
MCV: 106 fL — ABNORMAL HIGH (ref 80.0–100.0)
Platelets: 271 10*3/uL (ref 150–400)
RBC: 3.02 MIL/uL — ABNORMAL LOW (ref 3.87–5.11)
RDW: 17.2 % — ABNORMAL HIGH (ref 11.5–15.5)
WBC: 13.7 10*3/uL — ABNORMAL HIGH (ref 4.0–10.5)
nRBC: 0.5 % — ABNORMAL HIGH (ref 0.0–0.2)

## 2020-10-09 LAB — AEROBIC/ANAEROBIC CULTURE W GRAM STAIN (SURGICAL/DEEP WOUND)

## 2020-10-09 LAB — GLUCOSE, CAPILLARY
Glucose-Capillary: 95 mg/dL (ref 70–99)
Glucose-Capillary: 262 mg/dL — ABNORMAL HIGH (ref 70–99)
Glucose-Capillary: 187 mg/dL — ABNORMAL HIGH (ref 70–99)
Glucose-Capillary: 165 mg/dL — ABNORMAL HIGH (ref 70–99)

## 2020-10-09 LAB — BRAIN NATRIURETIC PEPTIDE: B Natriuretic Peptide: 3823.5 pg/mL — ABNORMAL HIGH (ref 0.0–100.0)

## 2020-10-09 MED ORDER — ISOSORB DINITRATE-HYDRALAZINE 20-37.5 MG PO TABS
1.0000 | ORAL_TABLET | Freq: Three times a day (TID) | ORAL | Status: DC
  Administered 2020-10-09 – 2020-10-11 (×7): 1 via ORAL
  Filled 2020-10-09 (×8): qty 1

## 2020-10-09 MED ORDER — GUAIFENESIN ER 600 MG PO TB12
600.0000 mg | ORAL_TABLET | Freq: Two times a day (BID) | ORAL | Status: DC
  Administered 2020-10-09 (×2): 600 mg via ORAL
  Filled 2020-10-09 (×2): qty 1

## 2020-10-09 MED ORDER — FUROSEMIDE 20 MG PO TABS
20.0000 mg | ORAL_TABLET | Freq: Every day | ORAL | Status: DC
  Administered 2020-10-09 – 2020-10-11 (×3): 20 mg via ORAL
  Filled 2020-10-09 (×3): qty 1

## 2020-10-09 NOTE — Progress Notes (Signed)
Progress Note  Patient Name: Ebony Scott Date of Encounter: 10/09/2020  [facility] Cardiologist: Buford Dresser, MD   Profile     70 y.o. 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 Patient was recently admitted for septic right elbow olecranon bursitis and cellulitis of right hand. Underwent I&D on 09/22/2020. Cardiology was consulted for further evaluation of newly diagnosed cardiomyopathy and she was started on GDMT (Entresto and Bisoprolol). Discharged on 09/30/2020. Readmitted on 10/04/2020 for AKI with creatinine of 4.07 and hypotension  Cr>> 1.3  Subjective  Still with dyspnea no worse, but no better   Inpatient Medications    Scheduled Meds:  allopurinol  100 mg Oral Daily   arformoterol  15 mcg Nebulization BID   And   umeclidinium bromide  1 puff Inhalation Daily   atorvastatin  80 mg Oral Daily   bisoprolol  2.5 mg Oral Daily   cyproheptadine  8 mg Oral QPM   fluconazole  200 mg Oral Daily   folic acid  1 mg Oral Daily   gabapentin  300 mg Oral QHS   insulin aspart  0-9 Units Subcutaneous TID WC   isosorbide-hydrALAZINE  0.5 tablet Oral BID   loratadine  10 mg Oral Daily   magnesium oxide  400 mg Oral Daily   mirtazapine  30 mg Oral QHS   montelukast  10 mg Oral Daily   pantoprazole  40 mg Oral Daily   predniSONE  20 mg Oral Q breakfast   rivaroxaban  15 mg Oral Daily   Continuous Infusions:  PRN Meds: acetaminophen **OR** acetaminophen, albuterol, guaiFENesin-dextromethorphan, traMADol   Vital Signs    Vitals:   10/08/20 1919 10/08/20 2018 10/09/20 0546 10/09/20 0721  BP:  (!) 143/66 (!) 155/83   Pulse: 66 63 78   Resp: 18  (!) 22 18   Temp:  98.1 F (36.7 C) 98.2 F (36.8 C)   TempSrc:  Oral Oral   SpO2: 97% 99% 93% 91%  Weight:      Height:        Intake/Output Summary (Last 24 hours) at 10/09/2020 0753 Last data filed at 10/08/2020 2050 Gross per 24 hour  Intake 300 ml  Output 300 ml  Net 0 ml    Last 3 Weights 10/08/2020 10/07/2020 10/06/2020  Weight (lbs) 131 lb 9.8 oz 131 lb 13.4 oz 133 lb 2.5 oz  Weight (kg) 59.7 kg 59.8 kg 60.4 kg      Telemetry    Telemetry Personally reviewed sinus  ECG    No new tracings - Personally Reviewed  Physical Exam  Well developed and nourished in mild resp distress; cushingoid HENT normal Neck supple with JVP-  8-10 Clear Regular rate and rhythm, no murmurs or gallops Abd-soft with active BS No Clubbing cyanosis edema Skin-warm and dry A & Oriented  Grossly normal sensory and motor function      Labs    High Sensitivity Troponin:  No results for input(s): TROPONINIHS in the last 720 hours.    Chemistry Recent Labs  Lab 10/04/20 1455 10/04/20 2257 10/06/20 0640 10/07/20 0444 10/08/20 0457 10/09/20 0433  NA 131*   < > 144 147* 144 142  K 6.0*   < >  3.9 3.7 3.9 4.1  CL 97*   < > 114* 118* 114* 113*  CO2 21*   < > 18* 19* 19* 21*  GLUCOSE 345*   < > 136* 85 83 105*  BUN 127*   < > 68* 48* 34* 29*  CREATININE 4.07*   < > 1.74* 1.58* 1.33* 1.28*  CALCIUM 7.7*   < > 7.7* 8.1* 8.3* 8.6*  PROT 6.6  --  5.4*  --   --  5.8*  ALBUMIN 2.6*  --  2.3*  --   --  2.4*  AST 98*  --  37  --   --  35  ALT 132*  --  72*  --   --  50*  ALKPHOS 268*  --  191*  --   --  154*  BILITOT 0.5  --  0.6  --   --  0.7  GFRNONAA 11*   < > 31* 35* 43* 45*  ANIONGAP 13   < > '12 10 11 8   '$ < > = values in this interval not displayed.      Hematology Recent Labs  Lab 10/07/20 0444 10/08/20 0457 10/09/20 0433  WBC 12.0* 14.9* 13.7*  RBC 3.22* 3.18* 3.02*  HGB 10.4* 10.3* 9.8*  HCT 34.1* 34.3* 32.0*  MCV 105.9* 107.9* 106.0*  MCH 32.3 32.4 32.5  MCHC  30.5 30.0 30.6  RDW 16.9* 17.2* 17.2*  PLT 309 285 271     BNP Recent Labs  Lab 10/04/20 1455  BNP 1,083.7*      DDimer No results for input(s): DDIMER in the last 168 hours.   Radiology    No results found.  Cardiac Studies   Echocardiogram 09/16/2020:  1. Septal apical and anterior wall hypokinesis EF decreased compard to  TTE done 07/10/19 . Left ventricular ejection fraction, by estimation, is  35 to 40%. The left ventricle has moderately decreased function. The left  ventricle demonstrates regional  wall motion abnormalities (see scoring diagram/findings for description).  The left ventricular internal cavity size was moderately dilated. Left  ventricular diastolic parameters are consistent with Grade II diastolic  dysfunction (pseudonormalization).  Elevated left ventricular end-diastolic pressure.   2. Right ventricular systolic function is normal. The right ventricular  size is normal.   3. Left atrial size was mildly dilated.   4. The mitral valve is abnormal. Mild mitral valve regurgitation. No  evidence of mitral stenosis.   5. The aortic valve is tricuspid. There is moderate calcification of the  aortic valve. Aortic valve regurgitation is not visualized. Mild to  moderate aortic valve sclerosis/calcification is present, without any  evidence of aortic stenosis.   6. The inferior vena cava is normal in size with greater than 50%  respiratory variability, suggesting right atrial pressure of 3 mmHg.     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

## 2020-10-09 NOTE — Progress Notes (Signed)
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  She underwent incision and drainage on 09/22/2020.  Cardiology was consulted, and she was started on Entresto and bisoprolol.  Discharged on 09/30/2020.  Patient was readmitted with AKI, creatinine of 4.07.  Her creatinine was 1.8 on discharge on 11th August.    Subjective   Patient complains of dyspnea.  Not requiring oxygen.  O2 sats 98% on room air.   Assessment/Plan:    Acute on chronic kidney disease stage III -Baseline creatinine around 1.6-1.8 -Presented with creatinine of 4.07; likely from Entresto and hypotension -[name] is currently on hold -Started on IV normal saline; creatinine improved to 1.28 -At baseline; will hold colchicine at this time -IV fluids were discontinued -Follow BMP in am  Acute on chronic  combined systolic and diastolic CHF -Last EF was 35 to 40% in July 2022 -Entresto and bisoprolol were held -Cardiology has restarted bisoprolol, Entresto is still on hold -Diuretics on hold -Repeat BNP today shows elevation at 3823.5 -Patient is not requiring oxygen though reports dyspnea -We will start Lasix at low-dose 20 mg p.o. daily   COPD exacerbation -Continue Brovana, Incruse Ellipta -As needed albuterol nebulizer  Diabetes mellitus type 2 -Hemoglobin A1c was 8.1, 2 weeks ago -CBGs elevated due to steroids -We will start weaning off steroids at this time -Steroids have been  weaned off to prednisone 20 mg p.o. daily -Start sliding scale insulin with NovoLog  History of rheumatoid arthritis -Patient is on prednisone and Arava at home -Started on Solu-Cortef stress dose 50 mg every 8 hours -Solu-Cortef has been weaned off -Patient takes prednisone 15 mg daily at home -Continue prednisone to 20 mg p.o. daily  History of DVT/PE -Patient is on Xarelto at home -Started on heparin per pharmacy -Xarelto has been restarted  Recent septic olecranon bursitis -S/p debridement during last admission -Culture grew Candida albicans; started on fluconazole till October 20, 2020   Scheduled medications:    allopurinol  100 mg Oral Daily   arformoterol  15 mcg Nebulization BID   And   umeclidinium bromide  1 puff Inhalation Daily   atorvastatin  80 mg Oral Daily   bisoprolol  2.5 mg Oral Daily   cyproheptadine  8 mg Oral QPM   fluconazole  200 mg Oral Daily   folic acid  1 mg Oral Daily   furosemide  20 mg Oral Daily   gabapentin  300 mg Oral QHS   guaiFENesin  600 mg Oral BID   insulin aspart  0-9 Units Subcutaneous TID WC   isosorbide-hydrALAZINE  1 tablet Oral Q8H   loratadine  10 mg Oral Daily   magnesium oxide  400 mg Oral Daily   mirtazapine  30 mg Oral QHS   montelukast  10 mg Oral Daily   pantoprazole  40 mg Oral Daily   predniSONE  20 mg Oral Q breakfast   rivaroxaban  15 mg Oral Daily         Data Reviewed:   CBG:  Recent Labs  Lab 10/08/20 1140 10/08/20 1611 10/08/20 2014 10/09/20 0721 10/09/20 1130  GLUCAP 109* 185* 169* 95 262*    SpO2: 98 % O2 Flow Rate (L/min): 2 L/min    Vitals:   10/08/20 2018 10/09/20 0546 10/09/20 0721 10/09/20 1420  BP: (!) 143/66 (!) 155/83  (!) 146/74  Pulse: 63 78  68  Resp: (!) '22 18  20  '$ Temp: 98.1 F (36.7 C) 98.2 F (36.8 C)  97.8 F (36.6 C)  TempSrc: Oral Oral  Oral  SpO2: 99% 93% 91% 98%  Weight:      Height:         Intake/Output Summary (Last 24 hours) at 10/09/2020  1438 Last data filed at 10/09/2020 1427 Gross per 24 hour  Intake 300 ml  Output 750 ml  Net -450 ml    08/19 1901 - 08/21 0700 In: 300 [P.O.:300] Out: 900 [Urine:900]  Filed Weights   10/06/20 2033 10/07/20 0500 10/08/20 0623  Weight: 60.4 kg 59.8 kg 59.7 kg    CBC:  Recent Labs  Lab 10/04/20 1455 10/05/20 0447 10/06/20 0640 10/07/20 0444 10/08/20 0457 10/09/20 0433  WBC 15.8* 15.8* 13.3* 12.0* 14.9* 13.7*  HGB 11.3* 10.0* 10.2* 10.4* 10.3* 9.8*  HCT 35.8* 32.1* 32.9* 34.1* 34.3* 32.0*  PLT 372 374 331 309 285 271  MCV 104.1* 103.9* 105.1* 105.9* 107.9* 106.0*  MCH 32.8 32.4 32.6 32.3 32.4 32.5  MCHC 31.6 31.2 31.0 30.5 30.0 30.6  RDW 16.3* 16.4* 16.8* 16.9* 17.2* 17.2*  LYMPHSABS 0.5*  --   --   --   --   --   MONOABS 1.0  --   --   --   --   --   EOSABS 0.0  --   --   --   --   --   BASOSABS 0.1  --   --   --   --   --     Complete metabolic panel:  Recent Labs  Lab 10/04/20 1455 10/04/20 2257 10/05/20 0447 10/06/20 0640 10/07/20 0444 10/08/20 0457 10/09/20 0433 10/09/20 0904  NA 131* 136 141 144 147* 144 142  --   K 6.0* 5.8* 4.9 3.9 3.7 3.9 4.1  --   CL 97* 103 110 114* 118* 114* 113*  --   CO2 21* 21* 21* 18* 19* 19* 21*  --   GLUCOSE 345* 274* 160* 136* 85 83 105*  --   BUN 127* 89* 106* 68* 48* 34* 29*  --   CREATININE 4.07* 3.23* 2.82* 1.74* 1.58* 1.33* 1.28*  --   CALCIUM 7.7* 7.4* 7.3* 7.7* 8.1* 8.3* 8.6*  --   AST 98*  --   --  37  --   --  35  --   ALT 132*  --   --  72*  --   --  50*  --   ALKPHOS 268*  --   --  191*  --   --  154*  --   BILITOT 0.5  --   --  0.6  --   --  0.7  --   ALBUMIN 2.6*  --   --  2.3*  --   --  2.4*  --   MG 3.1*  --   --   --   --   --   --   --   INR  --  1.6*  --   --   --   --   --   --  BNP 1,083.7*  --   --   --   --   --   --  3,823.5*    No results for input(s): LIPASE, AMYLASE in the last 168 hours.  Recent Labs  Lab 10/04/20 1455 10/04/20 1724 10/09/20 0904  BNP 1,083.7*  --  3,823.5*   SARSCOV2NAA  --  NEGATIVE  --     ------------------------------------------------------------------------------------------------------------------ No results for input(s): CHOL, HDL, LDLCALC, TRIG, CHOLHDL, LDLDIRECT in the last 72 hours.  Lab Results  Component Value Date   HGBA1C 8.1 (H) 09/22/2020   ------------------------------------------------------------------------------------------------------------------ No results for input(s): TSH, T4TOTAL, T3FREE, THYROIDAB in the last 72 hours.  Invalid input(s): FREET3 ------------------------------------------------------------------------------------------------------------------ No results for input(s): VITAMINB12, FOLATE, FERRITIN, TIBC, IRON, RETICCTPCT in the last 72 hours.  Coagulation profile Recent Labs  Lab 10/04/20 2257  INR 1.6*   No results for input(s): DDIMER in the last 72 hours.  Cardiac Enzymes No results for input(s): CKTOTAL, CKMB, CKMBINDEX, TROPONINI in the last 168 hours.  ------------------------------------------------------------------------------------------------------------------    Component Value Date/Time   BNP 3,823.5 (H) 10/09/2020 0904   BNP 494 (H) 08/25/2020 1435     Antibiotics: Anti-infectives (From admission, onward)    Start     Dose/Rate Route Frequency Ordered Stop   10/05/20 1000  fluconazole (DIFLUCAN) tablet 200 mg        200 mg Oral Daily 10/04/20 2106          Radiology Reports  No results found.    DVT prophylaxis: Patient on IV heparin  Code Status: DNR  Family Communication: No family at bedside   Consultants:   Procedures:     Objective    Physical Examination:  General-appears in no acute distress Heart-S1-S2, regular, no murmur auscultated Lungs-clear to auscultation bilaterally, no wheezing or crackles auscultated Abdomen-soft, nontender, no organomegaly Extremities-no edema in the lower extremities Neuro-alert, oriented x3, no focal  deficit noted   Status is: Inpatient  Dispo: The patient is from: Skilled nursing facility              Anticipated d/c is to: Skilled nursing facility              Anticipated d/c date is: 10/10/20              Patient currently not stable for discharge  Barrier to discharge-acute kidney injury  COVID-19 Labs  No results for input(s): DDIMER, FERRITIN, LDH, CRP in the last 72 hours.  Lab Results  Component Value Date   [place] NEGATIVE 10/04/2020   [place] NEGATIVE 09/30/2020   [place] NEGATIVE 09/22/2020   [place] NEGATIVE 04/02/2020    Microbiology  Recent Results (from the past 240 hour(s))  Resp Panel by RT-PCR (Flu A&B, Covid) Nasopharyngeal Swab     Status: None   Collection Time: 09/30/20 10:20 AM   Specimen: Nasopharyngeal Swab; Nasopharyngeal(NP) swabs in vial transport medium  Result Value Ref Range Status   SARS Coronavirus 2 by RT PCR NEGATIVE NEGATIVE Final    Comment: (NOTE) SARS-CoV-2 target nucleic acids are NOT DETECTED.  The SARS-CoV-2 RNA is generally detectable in upper respiratory specimens during the acute phase of infection. The lowest concentration of SARS-CoV-2 viral copies this assay can detect is 138 copies/mL. A negative result does not preclude SARS-Cov-2 infection and should not be used as the sole basis for treatment or other patient management decisions. A negative result may occur with  improper specimen collection/handling, submission of specimen other than nasopharyngeal swab, presence of viral mutation(s) within  the areas targeted by this assay, and inadequate number of viral copies(<138 copies/mL). A negative result must be combined with clinical observations, patient history, and epidemiological information. The expected result is Negative.  Fact Sheet for Patients:  EntrepreneurPulse.com.au  Fact Sheet for Healthcare Providers:  IncredibleEmployment.be  This test is no t yet  approved or cleared by the [place] FDA and  has been authorized for detection and/or diagnosis of SARS-CoV-2 by FDA under an Emergency Use Authorization (EUA). This EUA will remain  in effect (meaning this test can be used) for the duration of the COVID-19 declaration under Section 564(b)(1) of the Act, 21 U.S.C.section 360bbb-3(b)(1), unless the authorization is terminated  or revoked sooner.       Influenza A by PCR NEGATIVE NEGATIVE Final   Influenza B by PCR NEGATIVE NEGATIVE Final    Comment: (NOTE) The Xpert Xpress SARS-CoV-2/FLU/RSV plus assay is intended as an aid in the diagnosis of influenza from Nasopharyngeal swab specimens and should not be used as a sole basis for treatment. Nasal washings and aspirates are unacceptable for Xpert Xpress SARS-CoV-2/FLU/RSV testing.  Fact Sheet for Patients: EntrepreneurPulse.com.au  Fact Sheet for Healthcare Providers: IncredibleEmployment.be  This test is not yet approved or cleared by the [place] FDA and has been authorized for detection and/or diagnosis of SARS-CoV-2 by FDA under an Emergency Use Authorization (EUA). This EUA will remain in effect (meaning this test can be used) for the duration of the COVID-19 declaration under Section 564(b)(1) of the Act, 21 U.S.C. section 360bbb-3(b)(1), unless the authorization is terminated or revoked.  Performed at [facility], [place] [address], [place], [place] [address]   SARS CORONAVIRUS 2 (TAT 6-24 HRS) Nasopharyngeal Nasopharyngeal Swab     Status: None   Collection Time: 10/04/20  5:24 PM   Specimen: Nasopharyngeal Swab  Result Value Ref Range Status   SARS Coronavirus 2 NEGATIVE NEGATIVE Final    Comment: (NOTE) SARS-CoV-2 target nucleic acids are NOT DETECTED.  The SARS-CoV-2 RNA is generally detectable in upper and lower respiratory specimens during the acute phase of infection. Negative results do not  preclude SARS-CoV-2 infection, do not rule out co-infections with other pathogens, and should not be used as the sole basis for treatment or other patient management decisions. Negative results must be combined with clinical observations, patient history, and epidemiological information. The expected result is Negative.  Fact Sheet for Patients: SugarRoll.be  Fact Sheet for Healthcare Providers: https://www.woods-mathews.com/  This test is not yet approved or cleared by the [place] FDA and  has been authorized for detection and/or diagnosis of SARS-CoV-2 by FDA under an Emergency Use Authorization (EUA). This EUA will remain  in effect (meaning this test can be used) for the duration of the COVID-19 declaration under Se ction 564(b)(1) of the Act, 21 U.S.C. section 360bbb-3(b)(1), unless the authorization is terminated or revoked sooner.  Performed at [facility], [place] [address] [place],  [address]          [facility] If 7PM-7AM, please contact night-coverage at [email or web address], Office  [phone]   10/09/2020, 2:38 PM  LOS: 5 days

## 2020-10-10 DIAGNOSIS — I420 Dilated cardiomyopathy: Secondary | ICD-10-CM

## 2020-10-10 DIAGNOSIS — N170 Acute kidney failure with tubular necrosis: Principal | ICD-10-CM

## 2020-10-10 DIAGNOSIS — I10 Essential (primary) hypertension: Secondary | ICD-10-CM

## 2020-10-10 LAB — CBC
HCT: 31.9 % — ABNORMAL LOW (ref 36.0–46.0)
Hemoglobin: 9.8 g/dL — ABNORMAL LOW (ref 12.0–15.0)
MCH: 31.8 pg (ref 26.0–34.0)
MCHC: 30.7 g/dL (ref 30.0–36.0)
MCV: 103.6 fL — ABNORMAL HIGH (ref 80.0–100.0)
Platelets: 275 10*3/uL (ref 150–400)
RBC: 3.08 MIL/uL — ABNORMAL LOW (ref 3.87–5.11)
RDW: 17.3 % — ABNORMAL HIGH (ref 11.5–15.5)
WBC: 16 10*3/uL — ABNORMAL HIGH (ref 4.0–10.5)
nRBC: 0.5 % — ABNORMAL HIGH (ref 0.0–0.2)

## 2020-10-10 LAB — GLUCOSE, CAPILLARY
Glucose-Capillary: 80 mg/dL (ref 70–99)
Glucose-Capillary: 155 mg/dL — ABNORMAL HIGH (ref 70–99)
Glucose-Capillary: 219 mg/dL — ABNORMAL HIGH (ref 70–99)

## 2020-10-10 LAB — BASIC METABOLIC PANEL
Anion gap: 9 (ref 5–15)
BUN: 33 mg/dL — ABNORMAL HIGH (ref 8–23)
CO2: 20 mmol/L — ABNORMAL LOW (ref 22–32)
Calcium: 8.5 mg/dL — ABNORMAL LOW (ref 8.9–10.3)
Chloride: 112 mmol/L — ABNORMAL HIGH (ref 98–111)
Creatinine, Ser: 1.24 mg/dL — ABNORMAL HIGH (ref 0.44–1.00)
GFR, Estimated: 46 mL/min — ABNORMAL LOW (ref >60)
Glucose, Bld: 100 mg/dL — ABNORMAL HIGH (ref 70–99)
Potassium: 4 mmol/L (ref 3.5–5.1)
Sodium: 141 mmol/L (ref 135–145)

## 2020-10-10 MED ORDER — SALINE SPRAY 0.65 % NA SOLN
1.0000 | NASAL | Status: DC | PRN
  Administered 2020-10-10: 1 via NASAL
  Filled 2020-10-10: qty 44

## 2020-10-10 MED ORDER — INSULIN ASPART 100 UNIT/ML IJ SOLN
0.0000 [IU] | Freq: Three times a day (TID) | INTRAMUSCULAR | Status: DC
  Administered 2020-10-10 (×2): 2 [IU] via SUBCUTANEOUS
  Administered 2020-10-11: 3 [IU] via SUBCUTANEOUS

## 2020-10-10 MED ORDER — GUAIFENESIN ER 600 MG PO TB12
1200.0000 mg | ORAL_TABLET | Freq: Two times a day (BID) | ORAL | Status: DC
  Administered 2020-10-10 – 2020-10-11 (×3): 1200 mg via ORAL
  Filled 2020-10-10 (×3): qty 2

## 2020-10-10 NOTE — Telephone Encounter (Signed)
Patient currently admitted 10/10/20

## 2020-10-10 NOTE — Progress Notes (Signed)
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  She underwent incision and drainage on 09/22/2020.  Cardiology was consulted, and she was started on Entresto and bisoprolol.  Discharged on 09/30/2020.  Patient was readmitted with AKI, creatinine of 4.07.  Her creatinine was 1.8 on discharge on 11th August.    Subjective   Still coughing up phlegm, was started on mucinex. Diuresed well with small dose of Po lasix 20 mg. -1450 ml. Still requiring 2l/min O2 via [place]>   Assessment/Plan:    Acute on chronic kidney disease stage III -Baseline creatinine around 1.6-1.8 -Presented with creatinine of 4.07; likely from Entresto and hypotension -Ebony Scott is currently on hold -Started on IV normal saline; creatinine improved to 1.24 -At baseline; will hold colchicine at this time -IV fluids were discontinued    Acute on chronic  combined systolic and diastolic CHF -Last EF was 35 to 40% in July 2022 -Entresto and bisoprolol were held -Cardiology has restarted bisoprolol, Entresto is still on hold -Diuretics on hold -Repeat BNP today shows elevation at 3823.5 -Diuresed 1.5 L with low-dose Lasix 20 mg daily  -Continue Lasix 20 mg p.o. daily.   -Started on BiDil 1 tablet p.o. 3 times daily by cardiology -Cardiology has signed off, recommendations per cardiology include Medication Recommendations:  Atorvastatin '80mg'$  daily, Bisoprolol 2.'5mg'$  daily, Lasix '20mg'$  daily, Bidil  20-37.'5mg'$  TID ( may need 2 tabs TID if BP remains uncontrolled),  Other recommendations (labs, testing, etc):  BMET in 1 week Follow up as an outpatient:  Followup with Dr. Harrell Gave in 1-2 weeks    COPD exacerbation -Continue Brovana, Incruse Ellipta -As needed albuterol nebulizer -As patient continues to have cough with phlegm, increase Mucinex dose to 1200 mg p.o. twice daily  Diabetes mellitus type 2 -Hemoglobin A1c was 8.1, 2 weeks ago -CBGs elevated due to steroids -Steroids have been weaned off to prednisone 20 mg p.o. daily -CBG has been low, will switch from sensitive to supersensitive sliding scale insulin   History of rheumatoid arthritis -Patient is on prednisone and Arava at home -Started on Solu-Cortef stress dose 50 mg every 8 hours -Solu-Cortef has been weaned off -Patient takes prednisone 15 mg daily at home -Continue prednisone to 20 mg p.o. daily  History of DVT/PE -Patient is on Xarelto at home -Started on heparin per [place] has been restarted  Recent septic olecranon bursitis -S/p debridement during last admission -Culture grew Candida albicans; started on fluconazole till October 20, 2020   Scheduled medications:    allopurinol  100 mg Oral Daily   arformoterol  15 mcg Nebulization BID   And   umeclidinium bromide  1 puff Inhalation Daily   atorvastatin  80 mg Oral Daily   bisoprolol  2.5 mg Oral Daily   cyproheptadine  8 mg Oral QPM   fluconazole  200 mg Oral Daily   folic acid  1 mg Oral Daily   furosemide  20 mg Oral Daily   gabapentin  300 mg Oral QHS   guaiFENesin  1,200 mg Oral BID   insulin aspart  0-9 Units Subcutaneous TID WC   isosorbide-hydrALAZINE  1 tablet Oral Q8H   loratadine  10 mg Oral Daily   magnesium oxide  400 mg Oral Daily   mirtazapine  30 mg Oral QHS   montelukast  10 mg Oral Daily   pantoprazole  40 mg Oral Daily   predniSONE  20 mg Oral Q breakfast   rivaroxaban  15 mg Oral Daily         Data  Reviewed:   CBG:  Recent Labs  Lab 10/09/20 0721 10/09/20 1130 10/09/20 1636 10/09/20 2325 10/10/20 0737  GLUCAP 95 262* 187* 165* 80    SpO2: 95 % O2 Flow Rate (L/min): 3 L/min    Vitals:   10/09/20 2146 10/10/20 0447 10/10/20 0528 10/10/20 0720  BP: (!) 143/75 (!) 159/95    Pulse: 69 83    Resp: 17 17    Temp: 97.9 F (36.6 C) 98.2 F (36.8 C)    TempSrc: Oral Oral    SpO2: 95% 96%  95%  Weight:   59.6 kg   Height:         Intake/Output Summary (Last 24 hours) at 10/10/2020 0847 Last data filed at 10/10/2020 0528 Gross per 24 hour  Intake 90 ml  Output 1450 ml  Net -1360 ml    08/20 1901 - 08/22 0700 In: 330 [P.O.:330] Out: 1750 [Urine:1750]  Filed Weights   10/07/20 0500 10/08/20 0623 10/10/20 0528  Weight: 59.8 kg 59.7 kg 59.6 kg    CBC:  Recent Labs  Lab 10/04/20 1455 10/05/20 0447 10/06/20 0640 10/07/20 0444 10/08/20 0457 10/09/20 0433 10/10/20 0418  WBC 15.8*   < > 13.3* 12.0* 14.9* 13.7* 16.0*  HGB 11.3*   < > 10.2* 10.4* 10.3* 9.8* 9.8*  HCT 35.8*   < > 32.9* 34.1* 34.3* 32.0* 31.9*  PLT 372   < > 331 309 285 271 275  MCV 104.1*   < > 105.1* 105.9* 107.9* 106.0* 103.6*  MCH 32.8   < > 32.6 32.3 32.4 32.5 31.8  MCHC 31.6   < > 31.0 30.5 30.0 30.6 30.7  RDW 16.3*   < > 16.8* 16.9* 17.2* 17.2* 17.3*  LYMPHSABS 0.5*  --   --   --   --   --   --   MONOABS 1.0  --   --   --   --   --   --   EOSABS 0.0  --   --   --   --   --   --   BASOSABS 0.1  --   --   --   --   --   --    < > = values in this interval not displayed.    Complete metabolic panel:  Recent Labs  Lab 10/04/20 1455 10/04/20 2257 10/05/20 0447 10/06/20 0640 10/07/20 0444 10/08/20 0457 10/09/20 0433 10/09/20 0904 10/10/20 0418  NA 131* 136   < > 144 147* 144 142  --  141  K 6.0* 5.8*   < > 3.9 3.7 3.9 4.1  --  4.0  CL 97* 103   < > 114* 118* 114* 113*  --  112*  CO2 21* 21*   < > 18* 19* 19* 21*  --  20*  GLUCOSE 345* 274*   < > 136* 85 83 105*  --  100*  BUN  127* 89*   < > 68* 48*  34* 29*  --  33*  CREATININE 4.07* 3.23*   < > 1.74* 1.58* 1.33* 1.28*  --  1.24*  CALCIUM 7.7* 7.4*   < > 7.7* 8.1* 8.3* 8.6*  --  8.5*  AST 98*  --   --  37  --   --  35  --   --   ALT 132*  --   --  72*  --   --  50*  --   --   ALKPHOS 268*  --   --  191*  --   --  154*  --   --   BILITOT 0.5  --   --  0.6  --   --  0.7  --   --   ALBUMIN 2.6*  --   --  2.3*  --   --  2.4*  --   --   MG 3.1*  --   --   --   --   --   --   --   --   INR  --  1.6*  --   --   --   --   --   --   --   BNP 1,083.7*  --   --   --   --   --   --  3,823.5*  --    < > = values in this interval not displayed.    No results for input(s): LIPASE, AMYLASE in the last 168 hours.  Recent Labs  Lab 10/04/20 1455 10/04/20 1724 10/09/20 0904  BNP 1,083.7*  --  3,823.5*  SARSCOV2NAA  --  NEGATIVE  --     ------------------------------------------------------------------------------------------------------------------ No results for input(s): CHOL, HDL, LDLCALC, TRIG, CHOLHDL, LDLDIRECT in the last 72 hours.  Lab Results  Component Value Date   HGBA1C 8.1 (H) 09/22/2020   ------------------------------------------------------------------------------------------------------------------ No results for input(s): TSH, T4TOTAL, T3FREE, THYROIDAB in the last 72 hours.  Invalid input(s): FREET3 ------------------------------------------------------------------------------------------------------------------ No results for input(s): VITAMINB12, FOLATE, FERRITIN, TIBC, IRON, RETICCTPCT in the last 72 hours.  Coagulation profile Recent Labs  Lab 10/04/20 2257  INR 1.6*   No results for input(s): DDIMER in the last 72 hours.  Cardiac Enzymes No results for input(s): CKTOTAL, CKMB, CKMBINDEX, TROPONINI in the last 168 hours.  ------------------------------------------------------------------------------------------------------------------    Component Value Date/Time   BNP 3,823.5 (H)  10/09/2020 0904   BNP 494 (H) 08/25/2020 1435     Antibiotics: Anti-infectives (From admission, onward)    Start     Dose/Rate Route Frequency Ordered Stop   10/05/20 1000  fluconazole (DIFLUCAN) tablet 200 mg        200 mg Oral Daily 10/04/20 2106          Radiology Reports  No results found.    DVT prophylaxis: Patient on IV heparin  Code Status: DNR  Family Communication: No family at bedside   Consultants:   Procedures:     Objective    Physical Examination:  General-appears in no acute distress Heart-S1-S2, regular, no murmur auscultated Lungs-scattered rhonchi bilaterally Abdomen-soft, nontender, no organomegaly Extremities-no edema in the lower extremities Neuro-alert, oriented x3, no focal deficit noted   Status is: Inpatient  Dispo: The patient is from: Skilled nursing facility              Anticipated d/c is to: Skilled nursing facility              Anticipated d/c date is: 10/11/20  Patient currently not stable for discharge  Barrier to discharge-acute kidney injury  COVID-19 Labs  No results for input(s): DDIMER, FERRITIN, LDH, CRP in the last 72 hours.  Lab Results  Component Value Date   [place] NEGATIVE 10/04/2020   [place] NEGATIVE 09/30/2020   [place] NEGATIVE 09/22/2020   [place] NEGATIVE 04/02/2020    Microbiology  Recent Results (from the past 240 hour(s))  Resp Panel by RT-PCR (Flu A&B, Covid) Nasopharyngeal Swab     Status: None   Collection Time: 09/30/20 10:20 AM   Specimen: Nasopharyngeal Swab; Nasopharyngeal(NP) swabs in vial transport medium  Result Value Ref Range Status   SARS Coronavirus 2 by RT PCR NEGATIVE NEGATIVE Final    Comment: (NOTE) SARS-CoV-2 target nucleic acids are NOT DETECTED.  The SARS-CoV-2 RNA is generally detectable in upper respiratory specimens during the acute phase of infection. The lowest concentration of SARS-CoV-2 viral copies this assay can detect  is 138 copies/mL. A negative result does not preclude SARS-Cov-2 infection and should not be used as the sole basis for treatment or other patient management decisions. A negative result may occur with  improper specimen collection/handling, submission of specimen other than nasopharyngeal swab, presence of viral mutation(s) within the areas targeted by this assay, and inadequate number of viral copies(<138 copies/mL). A negative result must be combined with clinical observations, patient history, and epidemiological information. The expected result is Negative.  Fact Sheet for Patients:  EntrepreneurPulse.com.au  Fact Sheet for Healthcare Providers:  IncredibleEmployment.be  This test is no t yet approved or cleared by the [place] FDA and  has been authorized for detection and/or diagnosis of SARS-CoV-2 by FDA under an Emergency Use Authorization (EUA). This EUA will remain  in effect (meaning this test can be used) for the duration of the COVID-19 declaration under Section 564(b)(1) of the Act, 21 U.S.C.section 360bbb-3(b)(1), unless the authorization is terminated  or revoked sooner.       Influenza A by PCR NEGATIVE NEGATIVE Final   Influenza B by PCR NEGATIVE NEGATIVE Final    Comment: (NOTE) The Xpert Xpress SARS-CoV-2/FLU/RSV plus assay is intended as an aid in the diagnosis of influenza from Nasopharyngeal swab specimens and should not be used as a sole basis for treatment. Nasal washings and aspirates are unacceptable for Xpert Xpress SARS-CoV-2/FLU/RSV testing.  Fact Sheet for Patients: EntrepreneurPulse.com.au  Fact Sheet for Healthcare Providers: IncredibleEmployment.be  This test is not yet approved or cleared by the [place] FDA and has been authorized for detection and/or diagnosis of SARS-CoV-2 by FDA under an Emergency Use Authorization (EUA). This EUA will remain in effect  (meaning this test can be used) for the duration of the COVID-19 declaration under Section 564(b)(1) of the Act, 21 U.S.C. section 360bbb-3(b)(1), unless the authorization is terminated or revoked.  Performed at [facility], [place] [address] [place], [place] [address]   SARS CORONAVIRUS 2 (TAT 6-24 HRS) Nasopharyngeal Nasopharyngeal Swab     Status: None   Collection Time: 10/04/20  5:24 PM   Specimen: Nasopharyngeal Swab  Result Value Ref Range Status   SARS Coronavirus 2 NEGATIVE NEGATIVE Final    Comment: (NOTE) SARS-CoV-2 target nucleic acids are NOT DETECTED.  The SARS-CoV-2 RNA is generally detectable in upper and lower respiratory specimens during the acute phase of infection. Negative results do not preclude SARS-CoV-2 infection, do not rule out co-infections with other pathogens, and should not be used as the sole basis for treatment or other patient management decisions. Negative  results must be combined with clinical observations, patient history, and epidemiological information. The expected result is Negative.  Fact Sheet for Patients: SugarRoll.be  Fact Sheet for Healthcare Providers: https://www.woods-mathews.com/  This test is not yet approved or cleared by the [place] FDA and  has been authorized for detection and/or diagnosis of SARS-CoV-2 by FDA under an Emergency Use Authorization (EUA). This EUA will remain  in effect (meaning this test can be used) for the duration of the COVID-19 declaration under Se ction 564(b)(1) of the Act, 21 U.S.C. section 360bbb-3(b)(1), unless the authorization is terminated or revoked sooner.  Performed at [facility], [place] [address], [place], [place] [address]          [facility] If 7PM-7AM, please contact night-coverage at [email or web address], Office  [phone]   10/10/2020, 8:47 AM  LOS: 6 days

## 2020-10-10 NOTE — Progress Notes (Addendum)
Progress Note  Patient Name: Ebony Scott Date of Encounter: 10/10/2020  [facility] Cardiologist: Buford Dresser, MD   Subjective   Still SOB with junky cough.  No chest pain  Inpatient Medications    Scheduled Meds:  allopurinol  100 mg Oral Daily   arformoterol  15 mcg Nebulization BID   And   umeclidinium bromide  1 puff Inhalation Daily   atorvastatin  80 mg Oral Daily   bisoprolol  2.5 mg Oral Daily   cyproheptadine  8 mg Oral QPM   fluconazole  200 mg Oral Daily   folic acid  1 mg Oral Daily   furosemide  20 mg Oral Daily   gabapentin  300 mg Oral QHS   guaiFENesin  1,200 mg Oral BID   insulin aspart  0-9 Units Subcutaneous TID WC   isosorbide-hydrALAZINE  1 tablet Oral Q8H   loratadine  10 mg Oral Daily   magnesium oxide  400 mg Oral Daily   mirtazapine  30 mg Oral QHS   montelukast  10 mg Oral Daily   pantoprazole  40 mg Oral Daily   predniSONE  20 mg Oral Q breakfast   rivaroxaban  15 mg Oral Daily   Continuous Infusions:   PRN Meds: acetaminophen **OR** acetaminophen, albuterol, traMADol   Vital Signs    Vitals:   10/09/20 2146 10/10/20 0447 10/10/20 0528 10/10/20 0720  BP: (!) 143/75 (!) 159/95    Pulse: 69 83    Resp: 17 17    Temp: 97.9 F (36.6 C) 98.2 F (36.8 C)    TempSrc: Oral Oral    SpO2: 95% 96%  95%  Weight:   59.6 kg   Height:        Intake/Output Summary (Last 24 hours) at 10/10/2020 1032 Last data filed at 10/10/2020 0902 Gross per 24 hour  Intake 330 ml  Output 1450 ml  Net -1120 ml    Last 3 Weights 10/10/2020 10/08/2020 10/07/2020  Weight (lbs) 131 lb 6.3 oz 131 lb 9.8 oz 131 lb 13.4 oz  Weight (kg) 59.6 kg 59.7 kg 59.8 kg      Telemetry    NSR - Personally Reviewed  ECG    No new tracings - Personally Reviewed  Physical Exam   GEN: Well nourished, well developed in no acute distress HEENT: Normal NECK: No JVD; No carotid bruits LYMPHATICS: No lymphadenopathy CARDIAC:RRR, no murmurs, rubs,  gallops RESPIRATORY: Clear to auscultation but rhonchi in large airways with cough ABDOMEN: Soft, non-tender, non-distended MUSCULOSKELETAL:  No edema; No deformity  SKIN: Warm and dry NEUROLOGIC:  Alert and oriented x 3 PSYCHIATRIC:  Normal affect   Labs    High Sensitivity Troponin:  No results for input(s): TROPONINIHS in the last 720 hours.    Chemistry Recent Labs  Lab 10/04/20 1455 10/04/20 2257 10/06/20 0640 10/07/20 0444 10/08/20 0457 10/09/20 0433 10/10/20 0418  NA 131*   < > 144   < > 144 142 141  K 6.0*   < > 3.9   < > 3.9 4.1 4.0  CL 97*   < > 114*   < > 114* 113* 112*  CO2 21*   < > 18*   < > 19* 21* 20*  GLUCOSE 345*   < > 136*   < > 83 105* 100*  BUN 127*   < > 68*   < > 34* 29* 33*  CREATININE 4.07*   < > 1.74*   < >  1.33* 1.28* 1.24*  CALCIUM 7.7*   < > 7.7*   < > 8.3* 8.6* 8.5*  PROT 6.6  --  5.4*  --   --  5.8*  --   ALBUMIN 2.6*  --  2.3*  --   --  2.4*  --   AST 98*  --  37  --   --  35  --   ALT 132*  --  72*  --   --  50*  --   ALKPHOS 268*  --  191*  --   --  154*  --   BILITOT 0.5  --  0.6  --   --  0.7  --   GFRNONAA 11*   < > 31*   < > 43* 45* 46*  ANIONGAP 13   < > 12   < > '11 8 9   '$ < > = values in this interval not displayed.      Hematology Recent Labs  Lab 10/08/20 0457 10/09/20 0433 10/10/20 0418  WBC 14.9* 13.7* 16.0*  RBC 3.18* 3.02* 3.08*  HGB 10.3* 9.8* 9.8*  HCT 34.3* 32.0* 31.9*  MCV 107.9* 106.0* 103.6*  MCH 32.4 32.5 31.8  MCHC 30.0 30.6 30.7  RDW 17.2* 17.2* 17.3*  PLT 285 271 275     BNP Recent Labs  Lab 10/04/20 1455 10/09/20 0904  BNP 1,083.7* 3,823.5*      DDimer No results for input(s): DDIMER in the last 168 hours.   Radiology    No results found.  Cardiac Studies   Echocardiogram 09/16/2020:  1. Septal apical and anterior wall hypokinesis EF decreased compard to  TTE done 07/10/19 . Left ventricular ejection fraction, by estimation, is  35 to 40%. The left ventricle has moderately decreased  function. The left  ventricle demonstrates regional  wall motion abnormalities (see scoring diagram/findings for description).  The left ventricular internal cavity size was moderately dilated. Left  ventricular diastolic parameters are consistent with Grade II diastolic  dysfunction (pseudonormalization).  Elevated left ventricular end-diastolic pressure.   2. Right ventricular systolic function is normal. The right ventricular  size is normal.   3. Left atrial size was mildly dilated.   4. The mitral valve is abnormal. Mild mitral valve regurgitation. No  evidence of mitral stenosis.   5. The aortic valve is tricuspid. There is moderate calcification of the  aortic valve. Aortic valve regurgitation is not visualized. Mild to  moderate aortic valve sclerosis/calcification is present, without any  evidence of aortic stenosis.   6. The inferior vena cava is normal in size with greater than 50%  respiratory variability, suggesting right atrial pressure of 3 mmHg.  Patient Profile     70 y.o. 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 Patient was recently admitted for septic right elbow olecranon bursitis and cellulitis of right hand. Underwent I&D on 09/22/2020. Cardiology was consulted for further evaluation of newly diagnosed cardiomyopathy and she was started on GDMT (Entresto and Bisoprolol). Discharged on 09/30/2020. Readmitted on 10/04/2020 for AKI with creatinine of 4.07. Cardiology following to assist with medication adjustments   Assessment & Plan    1. Chronic Heart Failure with Newly Reduced EF: Echo on 09/16/2020 showed LVEF of 35-40% with septal apical  and anterior wall hypokinesis which are  new. EF previously >75% in 2021. BNP elevated at 1,083 (424 on 8/4). Chest x-ray showed no acute findings. Entresto stopped due to AKI.  - SOB about the same and still coughing with junky sounding when coughing - Cxray 8/18 with no edema - suspect that her SOB is more related to her underlying lung disease>>? If the is has acute bronchitis with all the coughing and SOB she is havivng - Continue Bisoprolol 2.'5mg'$  daily. - currently off diuretics due to recent AKI and dose not appear volume overloaded - no further diuretics for now - No Farxiga for now given recent fungal infection. - given AKI wit initiation of Entresto will not restart at this time - Started on Bidil yesterday and lasix '20mg'$  daily - she put out 1.45L and is net +2L - SCr improved at 1.24 - Patient will ultimately need an ischemic evaluation  Prior chest CT showed diffuse coronary calcifications. Plan is for outpatient cardiac catheterization once patient recovers from acute infection and pending further improvement in renal function.   2. Hypotensive with History of Hypertension:  - BP was as low as 83/50 upon arrival - BP now on the high side at 159/35mHg - Continue Bisoprolol 2.'5mg'$  daily. - Had AKI with Entresto so will not reinitiate despite improved renal function for now - started on Bidil 20-37.'5mg'$  TID yesterday> may need to increase to 2 tabs TID if BP remains elevated   3. Hyperlipidemia: Lipid panel this admission: Total Cholesterol 318, Triglycerides 475, HDL 38. LDL could not be calculated due to Triglyceride level. Direct LDL 141. LDL goal <70 given CAD. Lipitor increased to '80mg'$  daily during last admission but she was discharged on '10mg'$  daily. LFTs elevated on admission but may be due to mild shock liver given hypotension; repeat with improvement in AST/ALT.  - continue atorvastatin to '80mg'$  daily - Will need repeat FLP/LFTs in 6-8 weeks   4. 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 Xarelto held on  admission due to AKI. Currently on heparin gtt.  - Continue management per primary team.   5. Acute on CKD Stage III: Creatinine 4.07 on admission but improvedwith IV fluids 3.23 > 2.82>1.24 today. Baseline around 1.3 to 1.6. Felt to be pre-renal in nature due to hypotension and Entresto. CT unremarkable. No obstruction. Entresto held. - Continue to monior.    6. Hyperkalemia: In setting of AKI. Recevied 2 dose of Lokelma and potassium normalized  - K+ 4 today - Continue to monitor.   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   Medication Recommendations:  Atorvastatin '80mg'$  daily, Bisoprolol 2.'5mg'$  daily, Lasix '20mg'$  daily, Bidil 20-37.'5mg'$  TID ( may need 2 tabs TID if BP remains uncontrolled),  Other recommendations (labs, testing, etc):  BMET in 1 week Follow up as an outpatient:  Followup with Dr. CHarrell Gavein 1-2 weeks     I have spent a total of 35 minutes with patient reviewing 15 , telemetry, EKGs, labs and examining patient as well as establishing an assessment and plan that was discussed with the patient.  > 50% of time was spent in direct patient care.     For  questions or updates, please contact [place] Please consult [email or web address] for contact info under        Signed, Fransico Him, MD  10/10/2020, 10:32 AM

## 2020-10-10 NOTE — Plan of Care (Signed)
A&O x4  Diet:  Heart healthy/carb modified  Fluid restriction: 1200 mls  AC & HS blood sugars (SS with meals)  Daily Weight  BM- 8/20  Purewick  Up 1 assist with walker  RA (prn 2L at night)  Vitals Routine  Telemetry- SR  22g L AC-SL  DVT Prevention: Xarelto  Skin: Right elbow  Right hand dry dsg sutures  Right foot 2nd toe fx dry dsg acewrap (hx surgery with bone removal)   Abnormal Labs/ VS: WBC 16, Creat 1.28 BUN 29 BNP 3823.5  hgb 9.8  Pt had 1 dose of tramadol for pain and was effective at decreasing pain.  Problem: Education: Goal: Knowledge of General Education information will improve Description: Including pain rating scale, medication(s)/side effects and non-pharmacologic comfort measures Outcome: Progressing   Problem: Health Behavior/Discharge Planning: Goal: Ability to manage health-related needs will improve Outcome: Progressing   Problem: Clinical Measurements: Goal: Ability to maintain clinical measurements within normal limits will improve Outcome: Progressing Goal: Will remain free from infection Outcome: Progressing Goal: Diagnostic test results will improve Outcome: Progressing Goal: Respiratory complications will improve Outcome: Progressing Goal: Cardiovascular complication will be avoided Outcome: Progressing   Problem: Activity: Goal: Risk for activity intolerance will decrease Outcome: Progressing   Problem: Nutrition: Goal: Adequate nutrition will be maintained Outcome: Progressing   Problem: Coping: Goal: Level of anxiety will decrease Outcome: Progressing   Problem: Elimination: Goal: Will not experience complications related to bowel motility Outcome: Progressing Goal: Will not experience complications related to urinary retention Outcome: Progressing   Problem: Pain Managment: Goal: General experience of comfort will improve Outcome: Progressing   Problem: Safety: Goal: Ability to remain free from injury will  improve Outcome: Progressing   Problem: Skin Integrity: Goal: Risk for impaired skin integrity will decrease Outcome: Progressing

## 2020-10-10 NOTE — Plan of Care (Signed)
  Problem: Activity: Goal: Risk for activity intolerance will decrease Outcome: Progressing   Problem: Education: Goal: Ability to demonstrate management of disease process will improve Outcome: Progressing   Problem: Education: Goal: Ability to verbalize understanding of medication therapies will improve Outcome: Progressing

## 2020-10-10 NOTE — Evaluation (Signed)
Physical Therapy Evaluation Patient Details Name: Ebony Scott MRN: [id number] DOB: 1950-09-30 Today's Date: 10/10/2020   History of Present Illness  Ebony Scott is a 70 y.o. 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 Patient s/p Excisional debridement, right olecranon bursa with olecranon bursectomy and irrigation and debridement and excisional debridement, right dorsal rheumatoid nodule, overlying the long metacarpal phalangeal joint.  Pt readmitted 10/04/20 for AKI.  Past medical history significant of ashtma, combined systolic/diastolic HF, PE, tracheobronchomalacia.   Clinical Impression  Progressing slowly with mobility. Mobility is limited by dyspnea, decreased activity tolerance and general weakness. Continue to recommend SNF.      Follow Up Recommendations SNF    Equipment Recommendations  None recommended by PT    Recommendations for Other Services       Precautions / Restrictions Precautions Precautions: Fall Splint/Cast: per ortho note 8/17: discontinued splint Other Brace: pt has right post op shoe and sneaker for left foot Restrictions Weight Bearing Restrictions: No Other Position/Activity Restrictions: per ortho note 8/17: WBAT R UE      Mobility  Bed Mobility               General bed mobility comments: oob in recliner    Transfers Overall transfer level: Needs assistance   Transfers: Sit to/from Stand Sit to Stand: Min guard         General transfer comment: Min guard for safety. Cues for hand placement. Pt had to sit back down after 1st stand 2* fatigue. She was able to progress after brief rest  Ambulation/Gait Ambulation/Gait assistance: Min assist Gait Distance (Feet): 10 Feet Assistive device: Rolling walker (2 wheeled) Gait Pattern/deviations: Step-through pattern;Decreased stride length     General Gait Details: Assist to steady pt. Slow, effortful gait. Fatigues very quickly.  Dyspnea 3/4. Remained in room for walk  Stairs            Wheelchair Mobility    Modified Rankin (Stroke Patients Only)       Balance Overall balance assessment: Needs assistance         Standing balance support: Bilateral upper extremity supported Standing balance-Leahy Scale: Poor                               Pertinent Vitals/Pain Pain Assessment: No/denies pain    Home Living                        Prior Function                 Hand Dominance        Extremity/Trunk Assessment                Communication      Cognition Arousal/Alertness: Awake/alert Behavior During Therapy: WFL for tasks assessed/performed Overall Cognitive Status: Within Functional Limits for tasks assessed                                        General Comments      Exercises General Exercises - Lower Extremity Ankle Circles/Pumps: AROM;Both;15 reps Long Arc Quad: AROM;Both;15 reps Hip Flexion/Marching: AROM;Both;15 reps   Assessment/Plan    PT Assessment    PT Problem List         PT Treatment  Interventions      PT Goals (Current goals can be found in the Care Plan section)       Frequency Min 2X/week   Barriers to discharge        Co-evaluation               AM-PAC PT "6 Clicks" Mobility  Outcome Measure Help needed turning from your back to your side while in a flat bed without using bedrails?: A Little Help needed moving from lying on your back to sitting on the side of a flat bed without using bedrails?: A Little Help needed moving to and from a bed to a chair (including a wheelchair)?: A Little Help needed standing up from a chair using your arms (e.g., wheelchair or bedside chair)?: A Little Help needed to walk in hospital room?: A Lot Help needed climbing 3-5 steps with a railing? : A Lot 6 Click Score: 16    End of Session Equipment Utilized During Treatment: Gait belt Activity Tolerance:  Patient limited by fatigue Patient left: in chair;with call bell/phone within reach   PT Visit Diagnosis: Unsteadiness on feet (R26.81);Difficulty in walking, not elsewhere classified (R26.2);Muscle weakness (generalized) (M62.81)    Time: [id number] PT Time Calculation (min) (ACUTE ONLY): 11 min   Charges:     PT Treatments $Gait Training: 8-22 mins           Doreatha Massed, PT Acute Rehabilitation  Office: [phone] Pager: [phone]

## 2020-10-10 NOTE — TOC Progression Note (Signed)
Transition of Care [facility]) - Progression Note    Patient Details  Name: HAYA RINDAL MRN: [id number] Date of Birth: 1950-03-14  [facility]) CM/SW Contact  Adasia Hoar, Juliann Pulse, RN Phone Number: 10/10/2020, 4:53 PM  Clinical Narrative: Dtr agree to [place] start Josem Kaufmann.      Expected Discharge Plan: [place] Barriers to Discharge: Continued Medical Work up  Expected Discharge Plan and Services Expected Discharge Plan: [place]   Discharge Planning Services: CM Consult   Living arrangements for the past 2 months: Single Family Home                                       Social Determinants of Health (SDOH) Interventions    Readmission Risk Interventions Readmission Risk Prevention Plan 09/26/2020  Transportation Screening Complete  Medication Review [employer]) Complete  PCP or Specialist appointment within 3-5 days of discharge Complete  HRI or [place] Complete  SW Recovery Care/Counseling Consult Complete  [place] Not Applicable  Some recent data might be hidden

## 2020-10-10 NOTE — TOC Progression Note (Signed)
Transition of Care [facility]) - Progression Note    Patient Details  Name: Ebony Scott MRN: [id number] Date of Birth: 12-Apr-1950  Transition of Care [facility]) CM/SW Contact  Yandriel Boening, Juliann Pulse, RN Phone Number: 10/10/2020, 9:17 AM  Clinical Narrative: Bed offers given to dtr [name]-await choice,then will start auth,will need covid.      Expected Discharge Plan: [place] Barriers to Discharge: Continued Medical Work up  Expected Discharge Plan and Services Expected Discharge Plan: [place]   Discharge Planning Services: CM Consult   Living arrangements for the past 2 months: Single Family Home                                       Social Determinants of Health (SDOH) Interventions    Readmission Risk Interventions Readmission Risk Prevention Plan 09/26/2020  Transportation Screening Complete  Medication Review [employer] photographer) Complete  PCP or Specialist appointment within 3-5 days of discharge Complete  HRI or [place] Complete  SW Recovery Care/Counseling Consult Complete  [place] Not Applicable  Some recent data might be hidden

## 2020-10-10 NOTE — Discharge Instructions (Signed)
Daily dressing changes at right hand and right elbow. Please change with either strile guaze and gauze wrap or mepilexs. Please call [facility] if any questions.

## 2020-10-10 NOTE — Plan of Care (Signed)
  Problem: Health Behavior/Discharge Planning: Goal: Ability to manage health-related needs will improve Outcome: Progressing   Problem: Clinical Measurements: Goal: Ability to maintain clinical measurements within normal limits will improve Outcome: Progressing Goal: Diagnostic test results will improve Outcome: Progressing Goal: Respiratory complications will improve Outcome: Progressing   Problem: Activity: Goal: Risk for activity intolerance will decrease Outcome: Progressing   Problem: Education: Goal: Knowledge of General Education information will improve Description: Including pain rating scale, medication(s)/side effects and non-pharmacologic comfort measures Outcome: Completed/Met   Problem: Clinical Measurements: Goal: Will remain free from infection Outcome: Completed/Met   Problem: Nutrition: Goal: Adequate nutrition will be maintained Outcome: Completed/Met   Problem: Coping: Goal: Level of anxiety will decrease Outcome: Completed/Met   Problem: Elimination: Goal: Will not experience complications related to bowel motility Outcome: Completed/Met Goal: Will not experience complications related to urinary retention Outcome: Completed/Met   Problem: Pain Managment: Goal: General experience of comfort will improve Outcome: Completed/Met   Problem: Safety: Goal: Ability to remain free from injury will improve Outcome: Completed/Met

## 2020-10-10 NOTE — Progress Notes (Signed)
Patient seen and examined. Wounds are as pictures show below. Stitches removed from right hand. Will keep stitches in place at right elbow so she continues to heal. Continue daily dressing changes, ok to use small mepilexes. Patient will follow up 1 week after discharge in our office.         Merlene Pulling, PA-C

## 2020-10-11 DIAGNOSIS — I5032 Chronic diastolic (congestive) heart failure: Secondary | ICD-10-CM | POA: Diagnosis not present

## 2020-10-11 DIAGNOSIS — R2681 Unsteadiness on feet: Secondary | ICD-10-CM | POA: Diagnosis not present

## 2020-10-11 DIAGNOSIS — R531 Weakness: Secondary | ICD-10-CM | POA: Diagnosis not present

## 2020-10-11 DIAGNOSIS — Z7401 Bed confinement status: Secondary | ICD-10-CM | POA: Diagnosis not present

## 2020-10-11 DIAGNOSIS — I5042 Chronic combined systolic (congestive) and diastolic (congestive) heart failure: Secondary | ICD-10-CM | POA: Diagnosis not present

## 2020-10-11 DIAGNOSIS — I429 Cardiomyopathy, unspecified: Secondary | ICD-10-CM | POA: Diagnosis not present

## 2020-10-11 DIAGNOSIS — E785 Hyperlipidemia, unspecified: Secondary | ICD-10-CM | POA: Diagnosis not present

## 2020-10-11 DIAGNOSIS — R0602 Shortness of breath: Secondary | ICD-10-CM | POA: Diagnosis not present

## 2020-10-11 DIAGNOSIS — E1169 Type 2 diabetes mellitus with other specified complication: Secondary | ICD-10-CM | POA: Diagnosis not present

## 2020-10-11 DIAGNOSIS — M069 Rheumatoid arthritis, unspecified: Secondary | ICD-10-CM | POA: Diagnosis not present

## 2020-10-11 DIAGNOSIS — B379 Candidiasis, unspecified: Secondary | ICD-10-CM | POA: Diagnosis not present

## 2020-10-11 DIAGNOSIS — E119 Type 2 diabetes mellitus without complications: Secondary | ICD-10-CM | POA: Diagnosis not present

## 2020-10-11 DIAGNOSIS — J4540 Moderate persistent asthma, uncomplicated: Secondary | ICD-10-CM | POA: Diagnosis not present

## 2020-10-11 DIAGNOSIS — G4736 Sleep related hypoventilation in conditions classified elsewhere: Secondary | ICD-10-CM | POA: Diagnosis not present

## 2020-10-11 DIAGNOSIS — M6281 Muscle weakness (generalized): Secondary | ICD-10-CM | POA: Diagnosis not present

## 2020-10-11 DIAGNOSIS — R0600 Dyspnea, unspecified: Secondary | ICD-10-CM | POA: Diagnosis not present

## 2020-10-11 DIAGNOSIS — G4733 Obstructive sleep apnea (adult) (pediatric): Secondary | ICD-10-CM | POA: Diagnosis not present

## 2020-10-11 DIAGNOSIS — M71121 Other infective bursitis, right elbow: Secondary | ICD-10-CM | POA: Diagnosis not present

## 2020-10-11 DIAGNOSIS — G894 Chronic pain syndrome: Secondary | ICD-10-CM | POA: Diagnosis not present

## 2020-10-11 DIAGNOSIS — J449 Chronic obstructive pulmonary disease, unspecified: Secondary | ICD-10-CM | POA: Diagnosis not present

## 2020-10-11 DIAGNOSIS — I509 Heart failure, unspecified: Secondary | ICD-10-CM | POA: Diagnosis not present

## 2020-10-11 DIAGNOSIS — I5040 Unspecified combined systolic (congestive) and diastolic (congestive) heart failure: Secondary | ICD-10-CM | POA: Diagnosis not present

## 2020-10-11 DIAGNOSIS — K219 Gastro-esophageal reflux disease without esophagitis: Secondary | ICD-10-CM | POA: Diagnosis not present

## 2020-10-11 DIAGNOSIS — R609 Edema, unspecified: Secondary | ICD-10-CM | POA: Diagnosis not present

## 2020-10-11 DIAGNOSIS — N1832 Chronic kidney disease, stage 3b: Secondary | ICD-10-CM | POA: Diagnosis not present

## 2020-10-11 DIAGNOSIS — G629 Polyneuropathy, unspecified: Secondary | ICD-10-CM | POA: Diagnosis not present

## 2020-10-11 DIAGNOSIS — Z7901 Long term (current) use of anticoagulants: Secondary | ICD-10-CM | POA: Diagnosis not present

## 2020-10-11 DIAGNOSIS — R262 Difficulty in walking, not elsewhere classified: Secondary | ICD-10-CM | POA: Diagnosis not present

## 2020-10-11 DIAGNOSIS — F32A Depression, unspecified: Secondary | ICD-10-CM | POA: Diagnosis not present

## 2020-10-11 DIAGNOSIS — J471 Bronchiectasis with (acute) exacerbation: Secondary | ICD-10-CM | POA: Diagnosis not present

## 2020-10-11 DIAGNOSIS — N179 Acute kidney failure, unspecified: Secondary | ICD-10-CM | POA: Diagnosis not present

## 2020-10-11 DIAGNOSIS — M0600 Rheumatoid arthritis without rheumatoid factor, unspecified site: Secondary | ICD-10-CM | POA: Diagnosis not present

## 2020-10-11 DIAGNOSIS — I2699 Other pulmonary embolism without acute cor pulmonale: Secondary | ICD-10-CM | POA: Diagnosis not present

## 2020-10-11 DIAGNOSIS — J45909 Unspecified asthma, uncomplicated: Secondary | ICD-10-CM | POA: Diagnosis not present

## 2020-10-11 DIAGNOSIS — I82409 Acute embolism and thrombosis of unspecified deep veins of unspecified lower extremity: Secondary | ICD-10-CM | POA: Diagnosis not present

## 2020-10-11 LAB — RESP PANEL BY RT-PCR (FLU A&B, COVID) ARPGX2
Influenza A by PCR: NEGATIVE
Influenza B by PCR: NEGATIVE
SARS Coronavirus 2 by RT PCR: NEGATIVE

## 2020-10-11 LAB — BASIC METABOLIC PANEL
Anion gap: 10 (ref 5–15)
BUN: 40 mg/dL — ABNORMAL HIGH (ref 8–23)
CO2: 23 mmol/L (ref 22–32)
Calcium: 8.5 mg/dL — ABNORMAL LOW (ref 8.9–10.3)
Chloride: 109 mmol/L (ref 98–111)
Creatinine, Ser: 1.43 mg/dL — ABNORMAL HIGH (ref 0.44–1.00)
GFR, Estimated: 39 mL/min — ABNORMAL LOW (ref >60)
Glucose, Bld: 96 mg/dL (ref 70–99)
Potassium: 3.9 mmol/L (ref 3.5–5.1)
Sodium: 142 mmol/L (ref 135–145)

## 2020-10-11 LAB — GLUCOSE, CAPILLARY
Glucose-Capillary: 84 mg/dL (ref 70–99)
Glucose-Capillary: 118 mg/dL — ABNORMAL HIGH (ref 70–99)
Glucose-Capillary: 232 mg/dL — ABNORMAL HIGH (ref 70–99)

## 2020-10-11 MED ORDER — FUROSEMIDE 20 MG PO TABS: 20.0000 mg | ORAL_TABLET | Freq: Every day | ORAL | Status: DC

## 2020-10-11 MED ORDER — ISOSORB DINITRATE-HYDRALAZINE 20-37.5 MG PO TABS: 1.0000 | ORAL_TABLET | Freq: Three times a day (TID) | ORAL | Status: DC

## 2020-10-11 MED ORDER — ALBUTEROL SULFATE (2.5 MG/3ML) 0.083% IN NEBU: 2.5000 mg | INHALATION_SOLUTION | RESPIRATORY_TRACT | 12 refills | Status: DC | PRN

## 2020-10-11 MED ORDER — ATORVASTATIN CALCIUM 80 MG PO TABS: 80.0000 mg | ORAL_TABLET | Freq: Every day | ORAL | Status: DC

## 2020-10-11 MED ORDER — TRAMADOL HCL 50 MG PO TABS: 50.0000 mg | ORAL_TABLET | Freq: Two times a day (BID) | ORAL | 0 refills | Status: AC | PRN

## 2020-10-11 MED ORDER — INSULIN ASPART 100 UNIT/ML IJ SOLN: 0.0000 [IU] | Freq: Three times a day (TID) | INTRAMUSCULAR | 11 refills | Status: DC

## 2020-10-11 NOTE — Progress Notes (Signed)
Received a call  from [facility] for report to [place] RN at 6:34 pm. Jerene Pitch

## 2020-10-11 NOTE — Plan of Care (Signed)
  Problem: Health Behavior/Discharge Planning: Goal: Ability to manage health-related needs will improve Outcome: Progressing   Problem: Clinical Measurements: Goal: Respiratory complications will improve Outcome: Progressing   Problem: Activity: Goal: Risk for activity intolerance will decrease Outcome: Progressing   Problem: Activity: Goal: Capacity to carry out activities will improve Outcome: Progressing   Problem: Cardiac: Goal: Ability to achieve and maintain adequate cardiopulmonary perfusion will improve Outcome: Progressing

## 2020-10-11 NOTE — Plan of Care (Signed)
  Problem: Clinical Measurements: Goal: Ability to maintain clinical measurements within normal limits will improve Outcome: Progressing Goal: Diagnostic test results will improve Outcome: Progressing Goal: Respiratory complications will improve Outcome: Progressing Goal: Cardiovascular complication will be avoided Outcome: Progressing   Problem: Activity: Goal: Risk for activity intolerance will decrease Outcome: Progressing   Problem: Clinical Measurements: Goal: Ability to maintain clinical measurements within normal limits will improve Outcome: Progressing Goal: Diagnostic test results will improve Outcome: Progressing Goal: Respiratory complications will improve Outcome: Progressing Goal: Cardiovascular complication will be avoided Outcome: Progressing   Problem: Activity: Goal: Risk for activity intolerance will decrease Outcome: Progressing

## 2020-10-11 NOTE — Progress Notes (Signed)
Prepared discharge instructions and placed in packet for receiving facility. IV removed intact.  Jerene Pitch

## 2020-10-11 NOTE — Telephone Encounter (Signed)
Patient is currently admitted 10/11/20

## 2020-10-11 NOTE — Progress Notes (Signed)
Attempt #1 to call report on pt to [phone]. Left a message at 6:27 pm for a call back. Jerene Pitch

## 2020-10-11 NOTE — Discharge Summary (Signed)
Physician Discharge Summary  Ebony Scott BSJ:[id number] DOB: 01/21/1951 DOA: 10/04/2020  PCP: [name], MD  Admit date: 10/04/2020 Discharge date: 10/11/2020  Time spent: 55 minutes  Recommendations for Outpatient Follow-up:  Follow-up cardiology in 1 week Follow-up orthopedics, Dr. Mardelle Matte as outpatient1 week Continue daily dressing changes, ok to use small mepilexes. Patient will follow up 1 week with orthopedics  Obtain BMP in 1 week  Discharge Diagnoses:  Principal Problem:   ARF (acute renal failure) ([place]) Active Problems:   Chronic combined systolic and diastolic CHF (congestive heart failure) (HCC)   Chronic kidney disease, stage 3b (HCC)   DVT (deep venous thrombosis) (HCC)   Rheumatoid arthritis (HCC)   DMII (diabetes mellitus, type 2) ([place])   Discharge Condition: Stable  Diet recommendation: Heart healthy diet  Filed Weights   10/08/20 0623 10/10/20 0528 10/11/20 0601  Weight: 59.7 kg 59.6 kg 57.9 kg    History of present illness:   70 year old female with history of diffuse aorta and coronary calcifications noted on chest CT on 07/2017, chronic combined CHF with a EF of 35 to 40% on recent echo, SVT, prior PE on lifelong anticoagulation, asthma, tracheobronchomalacia on CPAP, bronchiectasis, vocal cord dysfunction, seronegative rheumatoid arthritis, CKD stage III who was recently mated to the hospital for septic right elbow olecranon bursitis and cellulitis of right hand.  She underwent incision and drainage on 09/22/2020.  Cardiology was consulted, and she was started on Entresto and bisoprolol.  Discharged on 09/30/2020.   Patient was readmitted with AKI, creatinine of 4.07.  Her creatinine was 1.8 on discharge on 11th August  Hospital Course:   Acute on chronic kidney disease stage III -Baseline creatinine around 1.6-1.8 -Presented with creatinine of 4.07; likely from Entresto and hypotension -[name] is currently on hold -Started on IV normal saline;  creatinine improved to 1.43 -IV fluids were discontinued       Acute on chronic  combined systolic and diastolic CHF -Last EF was 35 to 40% in July 2022 -Entresto and bisoprolol were held -Cardiology has restarted bisoprolol, Entresto is still on hold -Diuretics on hold -Repeat BNP showed elevation at 3823.5 -Diuresed 1.5 L with low-dose Lasix 20 mg daily  -Continue Lasix 20 mg p.o. daily.   -Started on BiDil 1 tablet p.o. 3 times daily by cardiology -Cardiology has signed off, recommendations per cardiology include Medication Recommendations:  Atorvastatin 80mg  daily, Bisoprolol 2.5mg  daily, Lasix 20mg  daily, Bidil 20-37.5mg  TID ( may need 2 tabs TID if BP remains uncontrolled),  Other recommendations (labs, testing, etc):  BMET in 1 week Follow up as an outpatient:  Followup with Dr. Harrell Gave in 1-2 weeks      COPD exacerbation -Resolved, -Continue Brovana, Incruse Ellipta -As needed albuterol nebulizer --Continue 2 L/min oxygen at bedtime   Diabetes mellitus type 2 -Hemoglobin A1c was 8.1, 2 weeks ago -CBGs elevated due to steroids -Steroids have been weaned off to prednisone 20 mg p.o. daily -Continue taking prednisone 12 mg daily -Continue sensitive sliding scale insulin with NovoLog     History of rheumatoid arthritis -Patient is on prednisone and Arava at home -Started on Solu-Cortef stress dose 50 mg every 8 hours -Solu-Cortef has been weaned off -Patient takes prednisone 12 mg daily at home    History of DVT/PE -Patient is on Xarelto at home -Started on heparin per [facility] has been restarted   Recent septic olecranon bursitis -S/p debridement during last admission -Culture grew Candida albicans; started on fluconazole till October 20, 2020 -  Patient to follow-up with orthopedic surgery on September 2  Leukocytosis -Likely from steroids   Procedures:   Consultations: Cardiology  Discharge Exam: Vitals:   10/11/20 0813 10/11/20 1147   BP:  (!) 143/71  Pulse:  73  Resp:  18  Temp:  98.3 F (36.8 C)  SpO2: 95% 96%    General: Appears in no acute distress Cardiovascular: S1-S2, regular Respiratory: Clear to auscultation bilaterally  Discharge Instructions   Discharge Instructions     Diet - low sodium heart healthy   Complete by: As directed    Discharge wound care:   Complete by: As directed    Continue daily dressing changes, ok to use small mepilexes. Patient will follow up 1 week with orthopedics after discharge in their office.   Increase activity slowly   Complete by: As directed       Allergies as of 10/11/2020       Reactions   Baclofen Other (See Comments)   Altered mental status requiring a 3-day hospital stay- patient became unresponsive   Dust Mite Extract Shortness Of Breath, Other (See Comments)   "sneezing" (02/20/2012)   Molds & Smuts Shortness Of Breath   Morphine And Related Hives, Itching   Other Shortness Of Breath, Other (See Comments)   Grass and weeds "sneezing; filled sinuses" (02/20/2012)   Penicillins Rash, Other (See Comments)   "welts" (02/20/2012) Tolerated Ancef 02/16/20.   Rofecoxib Swelling, Other (See Comments)   Vioxx- feet swelling   Shellfish Allergy Anaphylaxis, Shortness Of Breath, Itching, Rash   Shrimp Flavor Anaphylaxis, Other (See Comments)   ALL SHELLFISH, also   Tetracycline Hcl Nausea And Vomiting   Xolair [omalizumab] Other (See Comments)   Caused Blood clot   Zoledronic Acid Other (See Comments)   Fever, Put in hospital, dr said it was a reaction from a reaction  Other reaction(s): Unknown   Dilaudid [hydromorphone Hcl] Itching   Levofloxacin Other (See Comments)   GI upset   Oxycodone Hcl Nausea And Vomiting   Paroxetine Nausea And Vomiting, Other (See Comments)   Paxil   Celecoxib Swelling, Other (See Comments)   Feet swelling   Dilaudid [hydromorphone] Other (See Comments)   "Allergic," per [facility]   Diltiazem Swelling   Diltiazem Hcl Swelling    Hydrocodone-acetaminophen Nausea And Vomiting, Other (See Comments)   "Allergic," per [facility]   Lactose Intolerance (gi) Other (See Comments)   Bloating and gas   Morphine Sulfate Other (See Comments)   "Gets loopy"   Oxycodone-acetaminophen Other (See Comments)   Reaction??   Strawberry Flavor Other (See Comments)   Reaction?   Tree Extract Other (See Comments)   "tested and told I was allergic to it; never experienced a reaction to it" (02/20/2012)   Penicillin G Procaine Rash, Other (See Comments)   Welts, also        Medication List     STOP taking these medications    cetirizine 10 MG tablet Commonly known as: ZYRTEC   diclofenac Sodium 1 % Gel Commonly known as: VOLTAREN   sacubitril-valsartan 49-51 MG Commonly known as: ENTRESTO   tiotropium 18 MCG inhalation capsule Commonly known as: SPIRIVA       TAKE these medications    albuterol (2.5 MG/3ML) 0.083% nebulizer solution Commonly known as: PROVENTIL Take 3 mLs (2.5 mg total) by nebulization every 4 (four) hours as needed for wheezing or shortness of breath.   allopurinol 100 MG tablet Commonly known as: ZYLOPRIM Take 100 mg by mouth  daily.   atorvastatin 80 MG tablet Commonly known as: LIPITOR Take 1 tablet (80 mg total) by mouth daily. Start taking on: October 12, 2020 What changed:  medication strength how much to take   azelastine 0.1 % nasal spray Commonly known as: ASTELIN USE 2 SPRAYS IN EACH NOSTRIL DAILY AS DIRECTED   bisoprolol 5 MG tablet Commonly known as: ZEBETA Take 0.5 tablets (2.5 mg total) by mouth daily.   Blood Glucose System Pak Kit Please dispense per patient and insurance preference. Use as directed to monitor FSBS 4x . Dx: E11.65   BLOOD GLUCOSE TEST STRIPS Strp Please dispense per patient and insurance preference. Use as directed to monitor FSBS 4x . Dx: E11.65   cholecalciferol 25 MCG (1000 UNIT) tablet Commonly known as: VITAMIN D Take 1,000 Units by mouth daily.    cyproheptadine 4 MG tablet Commonly known as: PERIACTIN TAKE 2 TABLETS BY MOUTH EVERY EVENING   Dexilant 60 MG capsule Generic drug: dexlansoprazole TAKE 1 CAPSULE BY MOUTH EACH MORNING   EPINEPHrine 0.3 mg/0.3 mL Soaj injection Commonly known as: EpiPen 2-Pak Inject 0.3 mLs (0.3 mg total) into the muscle Once PRN.   famotidine 20 MG tablet Commonly known as: PEPCID TAKE 1 TABLET BY MOUTH TWICE A DAY What changed: when to take this   fluconazole 200 MG tablet Commonly known as: DIFLUCAN Take 1 tablet (200 mg total) by mouth daily for 21 days.   fluticasone 50 MCG/ACT nasal spray Commonly known as: FLONASE USE 2 SPRAYS IN EACH NOSTRIL DAILY   folic acid 1 MG tablet Commonly known as: FOLVITE Take 1 mg by mouth daily.   FreeStyle Libre 14 Day Reader Kerrin Mo Apply 1 each topically daily as needed. USE AS DIRECTED TO MONITOR BLOOD GLUCOSE LEVELS. DX: E11.9.   FreeStyle Libre 14 Day Sensor Misc Apply 1 each topically every 14 (fourteen) days. USE AS DIRECTED TO MONITOR BLOOD GLUCOSE LEVELS. DX: E11.9.   furosemide 20 MG tablet Commonly known as: LASIX Take 1 tablet (20 mg total) by mouth daily. Start taking on: October 12, 2020 What changed:  medication strength how much to take additional instructions   gabapentin 600 MG tablet Commonly known as: NEURONTIN TAKE 1 TABLET BY MOUTH DAILY What changed: when to take this   guaiFENesin 600 MG 12 hr tablet Commonly known as: MUCINEX Take 1 tablet (600 mg total) by mouth 2 (two) times daily as needed for cough or to loosen phlegm.   insulin aspart 100 UNIT/ML injection Commonly known as: novoLOG Inject 0-9 Units into the skin 3 (three) times daily with meals. Sliding scale insulin Less than 70 initiate hypoglycemia protocol 70-120  0 units 120-150 1 unit 151-200 2 units 201-250 3 units 251-300 5 units 301-350 7 units 351-400 9 units Greater than 400 call MD   isosorbide-hydrALAZINE 20-37.5 MG tablet Commonly known as:  BIDIL Take 1 tablet by mouth every 8 (eight) hours.   magnesium oxide 400 MG tablet Commonly known as: MAG-OX Take 400 mg by mouth daily.   meclizine 25 MG tablet Commonly known as: ANTIVERT Take 25 mg by mouth 3 (three) times daily as needed for dizziness.   mirtazapine 30 MG disintegrating tablet Commonly known as: REMERON SOL-TAB DISSOLVE 1 TABLET BY MOUTH EVERY NIGHT AT BEDTIME   Mitigare 0.6 MG Caps Generic drug: Colchicine Take 0.6 mg by mouth every other day.   montelukast 10 MG tablet Commonly known as: SINGULAIR TAKE 1 TABLET BY MOUTH DAILY   predniSONE 1 MG tablet  Commonly known as: DELTASONE Take 12 tablets (12 mg total) by mouth daily with breakfast.   Stiolto Respimat 2.5-2.5 MCG/ACT Aers Generic drug: Tiotropium Bromide-Olodaterol Inhale 2 puffs into the lungs daily.   Systane Complete 0.6 % Soln Generic drug: Propylene Glycol Place 1 drop into both eyes 2 (two) times daily.   traMADol 50 MG tablet Commonly known as: ULTRAM Take 1 tablet (50 mg total) by mouth every 12 (twelve) hours as needed for up to 3 days for moderate pain. What changed:  when to take this reasons to take this   Xarelto 20 MG Tabs tablet Generic drug: rivaroxaban TAKE 1 TABLET BY MOUTH DAILY WITH SUPPER What changed:  how much to take when to take this               Discharge Care Instructions  (From admission, onward)           Start     Ordered   10/11/20 0000  Discharge wound care:       Comments: Continue daily dressing changes, ok to use small mepilexes. Patient will follow up 1 week with orthopedics after discharge in their office.   10/11/20 1520           Allergies  Allergen Reactions   Baclofen Other (See Comments)    Altered mental status requiring a 3-day hospital stay- patient became unresponsive   Dust Mite Extract Shortness Of Breath and Other (See Comments)    "sneezing" (02/20/2012)   Molds & Smuts Shortness Of Breath   Morphine And  Related Hives and Itching   Other Shortness Of Breath and Other (See Comments)    Grass and weeds "sneezing; filled sinuses" (02/20/2012)   Penicillins Rash and Other (See Comments)    "welts" (02/20/2012) Tolerated Ancef 02/16/20.    Rofecoxib Swelling and Other (See Comments)    Vioxx- feet swelling    Shellfish Allergy Anaphylaxis, Shortness Of Breath, Itching and Rash   Shrimp Flavor Anaphylaxis and Other (See Comments)    ALL SHELLFISH, also   Tetracycline Hcl Nausea And Vomiting   Xolair [Omalizumab] Other (See Comments)    Caused Blood clot   Zoledronic Acid Other (See Comments)    Fever, Put in hospital, dr said it was a reaction from a reaction  Other reaction(s): Unknown   Dilaudid [Hydromorphone Hcl] Itching   Levofloxacin Other (See Comments)    GI upset   Oxycodone Hcl Nausea And Vomiting   Paroxetine Nausea And Vomiting and Other (See Comments)    Paxil    Celecoxib Swelling and Other (See Comments)    Feet swelling   Dilaudid [Hydromorphone] Other (See Comments)    "Allergic," per [facility]   Diltiazem Swelling   Diltiazem Hcl Swelling   Hydrocodone-Acetaminophen Nausea And Vomiting and Other (See Comments)    "Allergic," per [facility]   Lactose Intolerance (Gi) Other (See Comments)    Bloating and gas   Morphine Sulfate Other (See Comments)    "Gets loopy"   Oxycodone-Acetaminophen Other (See Comments)    Reaction??   Strawberry Flavor Other (See Comments)    Reaction?   Tree Extract Other (See Comments)    "tested and told I was allergic to it; never experienced a reaction to it" (02/20/2012)   Penicillin G Procaine Rash and Other (See Comments)    Welts, also    Follow-up Information     Teryl Lucy, MD. Schedule an appointment as soon as possible for a visit in 1 week(s).  Specialty: Orthopedic Surgery Contact information: [address] [place] [address] [phone]         [facility] Follow up on  10/18/2020.   Specialty: Cardiology Why: Please arrive 15 minutes early for your 8am post-hospital cardiology appointment Contact information: [address] [place] [address] [phone]                 The results of significant diagnostics from this hospitalization (including imaging, microbiology, ancillary and laboratory) are listed below for reference.    Significant Diagnostic Studies: DG Chest 2 View  Result Date: 10/06/2020 CLINICAL DATA:  Acute renal failure, shortness of breath EXAM: CHEST - 2 VIEW COMPARISON:  10/04/2020 FINDINGS: Mild enlargement of cardiac silhouette unchanged. Mediastinal contours and pulmonary vascularity normal. Slight chronic accentuation of interstitial markings. No acute infiltrate, pleural effusion or pneumothorax. Bones demineralized with postsurgical changes of cervical spine fusion and reverse RIGHT shoulder arthroplasty. IMPRESSION: Enlargement of cardiac silhouette. No acute abnormalities. Electronically Signed   By: Lavonia Dana M.D.   On: 10/06/2020 12:38   DG Chest Port 1 View  Result Date: 10/04/2020 CLINICAL DATA:  Possible fluid overload with increased shortness of breath, initial encounter EXAM: PORTABLE CHEST 1 VIEW COMPARISON:  09/22/2020 FINDINGS: Postsurgical changes are again noted. Cardiac shadow is enlarged but stable. Lungs are clear. No significant vascular congestion is noted. No bony abnormality is noted. IMPRESSION: No findings to suggest fluid overload. Electronically Signed   By: Inez Catalina M.D.   On: 10/04/2020 17:18   DG Chest Port 1 View  Result Date: 09/22/2020 CLINICAL DATA:  Questionable sepsis.  Evaluate for abnormality. EXAM: PORTABLE CHEST 1 VIEW COMPARISON:  Chest CT 08/23/20 FINDINGS: Electronic device is positioned over the right upper lobe which precludes evaluation of this area. Stable cardiac enlargement. No pleural effusion or edema. No airspace opacities identified.  Remote left posterior rib fractures. Previous right shoulder arthroplasty. IMPRESSION: No acute cardiopulmonary abnormalities. Electronically Signed   By: Kerby Moors M.D.   On: 09/22/2020 14:23   DG Hand Complete Right  Result Date: 09/22/2020 CLINICAL DATA:  Questionable sepsis - evaluate for abnormality EXAM: RIGHT HAND - COMPLETE 3+ VIEW COMPARISON:  Radiograph 05/25/2020 FINDINGS: There is no acute fracture or dislocation. Prior resection of the trapezium. Mild radiocarpal and intercarpal joint degenerative change. There is volar subluxation of the middle finger MCP joint and possibly the index finger MCP joint. There is moderate-severe interphalangeal joint degenerative change, worst at the proximal phalangeal joints of the third through fifth digits. There is prominent dorsal soft tissue swelling. There are soft tissue calcifications along the thumb and heterotopic ossification along the volar soft tissues adjacent to the fourth metacarpal, unchanged from prior exam. There is no new osseous erosion or destruction. IMPRESSION: Prominent dorsal soft tissue swelling. Scattered osteoarthritis with prior trapeziectomy. Volar subluxation of the third MCP joint and possibly the index finger MCP joint which may be chronic. No radiographic evidence of osteomyelitis. Electronically Signed   By: Maurine Simmering   On: 09/22/2020 14:29   DG Foot Complete Right  Result Date: 09/28/2020 CLINICAL DATA:  RIGHT foot pain. EXAM: RIGHT FOOT COMPLETE - 3+ VIEW COMPARISON:  Foot radiograph 07/20/2020 FINDINGS: Chronic fractures at the base of the fourth and fifth metatarsal. No acute fracture dislocation. No soft tissue abnormality. IMPRESSION: 1. No acute findings of the RIGHT foot. 2. Chronic fractures of the base of the fourth and fifth metatarsal. Electronically Signed   By:  Suzy Bouchard M.D.   On: 09/28/2020 16:05   ECHOCARDIOGRAM COMPLETE  Result Date: 09/16/2020    ECHOCARDIOGRAM REPORT   Patient Name:   JANNIFER FISCHLER  Date of Exam: 09/16/2020 Medical Rec #:  [id number]     Height:       61.0 in Accession #:    [id number]    Weight:       143.0 lb Date of Birth:  1951/02/02      BSA:          1.638 m Patient Age:    72 years      BP:           128/72 mmHg Patient Gender: F             HR:           96 bpm. Exam Location:  [address] Procedure: 2D Echo, Cardiac Doppler, Color Doppler and Intracardiac            Opacification Agent Indications:    R06.00 Dyspnea  History:        Patient has prior history of Echocardiogram examinations, most                 recent 07/10/2019. CHF, Signs/Symptoms:Dyspnea and Edema; Risk                 Factors:Family History of Coronary Artery Disease and                 Dyslipidemia. DVT, Left Breast Cancer status post Lumpectomy.  Sonographer:    Deliah Boston RDCS Referring Phys: Amesti  1. Septal apical and anterior wall hypokinesis EF decreased compard to TTE done 07/10/19 . Left ventricular ejection fraction, by estimation, is 35 to 40%. The left ventricle has moderately decreased function. The left ventricle demonstrates regional wall motion abnormalities (see scoring diagram/findings for description). The left ventricular internal cavity size was moderately dilated. Left ventricular diastolic parameters are consistent with Grade II diastolic dysfunction (pseudonormalization). Elevated left ventricular end-diastolic pressure.  2. Right ventricular systolic function is normal. The right ventricular size is normal.  3. Left atrial size was mildly dilated.  4. The mitral valve is abnormal. Mild mitral valve regurgitation. No evidence of mitral stenosis.  5. The aortic valve is tricuspid. There is moderate calcification of the aortic valve. Aortic valve regurgitation is not visualized. Mild to moderate aortic valve sclerosis/calcification is present, without any evidence of aortic stenosis.  6. The inferior vena cava is normal in size with greater than 50%  respiratory variability, suggesting right atrial pressure of 3 mmHg. FINDINGS  Left Ventricle: Septal apical and anterior wall hypokinesis EF decreased compard to TTE done 07/10/19. Left ventricular ejection fraction, by estimation, is 35 to 40%. The left ventricle has moderately decreased function. The left ventricle demonstrates regional wall motion abnormalities. Definity contrast agent was given IV to delineate the left ventricular endocardial borders. The left ventricular internal cavity size was moderately dilated. There is no left ventricular hypertrophy. Left ventricular diastolic parameters are consistent with Grade II diastolic dysfunction (pseudonormalization). Elevated left ventricular end-diastolic pressure. Right Ventricle: The right ventricular size is normal. No increase in right ventricular wall thickness. Right ventricular systolic function is normal. Left Atrium: Left atrial size was mildly dilated. Right Atrium: Right atrial size was normal in size. Pericardium: There is no evidence of pericardial effusion. Mitral Valve: The mitral valve is abnormal. There is mild thickening of the mitral valve  leaflet(s). There is mild calcification of the mitral valve leaflet(s). Mild mitral valve regurgitation. No evidence of mitral valve stenosis. Tricuspid Valve: The tricuspid valve is normal in structure. Tricuspid valve regurgitation is not demonstrated. No evidence of tricuspid stenosis. Aortic Valve: The aortic valve is tricuspid. There is moderate calcification of the aortic valve. Aortic valve regurgitation is not visualized. Mild to moderate aortic valve sclerosis/calcification is present, without any evidence of aortic stenosis. Pulmonic Valve: The pulmonic valve was normal in structure. Pulmonic valve regurgitation is not visualized. No evidence of pulmonic stenosis. Aorta: The aortic root is normal in size and structure. Venous: The inferior vena cava is normal in size with greater than 50%  respiratory variability, suggesting right atrial pressure of 3 mmHg. IAS/Shunts: No atrial level shunt detected by color flow Doppler.  LEFT VENTRICLE PLAX 2D LVIDd:         5.40 cm  Diastology LVIDs:         4.30 cm  LV e' medial:    4.68 cm/s LV PW:         0.90 cm  LV E/e' medial:  21.9 LV IVS:        0.80 cm  LV e' lateral:   5.11 cm/s LVOT diam:     2.00 cm  LV E/e' lateral: 20.1 LV SV:         60 LV SV Index:   36 LVOT Area:     3.14 cm  RIGHT VENTRICLE RV S prime:     15.10 cm/s TAPSE (M-mode): 2.1 cm LEFT ATRIUM             Index       RIGHT ATRIUM           Index LA diam:        4.10 cm 2.50 cm/m  RA Area:     10.80 cm LA Vol (A2C):   86.4 ml 52.75 ml/m RA Volume:   22.20 ml  13.55 ml/m LA Vol (A4C):   66.3 ml 40.48 ml/m LA Biplane Vol: 81.5 ml 49.76 ml/m  AORTIC VALVE LVOT Vmax:   90.15 cm/s LVOT Vmean:  61.300 cm/s LVOT VTI:    0.190 m  AORTA Ao Root diam: 2.90 cm Ao Asc diam:  3.10 cm MITRAL VALVE MV Area (PHT): cm          SHUNTS MV Decel Time: 167 msec     Systemic VTI:  0.19 m MV E velocity: 102.53 cm/s  Systemic Diam: 2.00 cm MV A velocity: 122.67 cm/s MV E/A ratio:  0.84 Jenkins Rouge MD Electronically signed by Jenkins Rouge MD Signature Date/Time: 09/16/2020/4:14:28 PM    Final    CT RENAL STONE STUDY  Result Date: 10/04/2020 CLINICAL DATA:  Urinary tract stone, symptomatic/complicated. Elevated creatinine EXAM: CT ABDOMEN AND PELVIS WITHOUT CONTRAST TECHNIQUE: Multidetector CT imaging of the abdomen and pelvis was performed following the standard protocol without IV contrast. COMPARISON:  None. FINDINGS: Lower chest: Coronary artery calcifications.  Bibasilar scarring. Hepatobiliary: No focal liver abnormality. Status post cholecystectomy. No biliary dilatation. Pancreas: No focal lesion. Normal pancreatic contour. No surrounding inflammatory changes. No main pancreatic ductal dilatation. Spleen: Normal in size without focal abnormality. Adrenals/Urinary Tract: No adrenal nodule  bilaterally. Bilateral renal cortical scarring and atrophy. No nephrolithiasis and no hydronephrosis. No definite contour-deforming renal mass. No ureterolithiasis or hydroureter. The urinary bladder is unremarkable. Stomach/Bowel: Stomach is within normal limits. No evidence of bowel wall thickening or dilatation. Couple scattered colonic diverticula. Appendix  appears normal. Vascular/Lymphatic: No abdominal aorta or iliac aneurysm. Mild atherosclerotic plaque of the aorta and its branches. No abdominal, pelvic, or inguinal lymphadenopathy. Reproductive: Status post hysterectomy. No adnexal masses. Other: No intraperitoneal free fluid. No intraperitoneal free gas. No organized fluid collection. Musculoskeletal: Tiny fat containing umbilical hernia. Asymmetrically atrophic right iliopsoas muscle. No suspicious lytic or blastic osseous lesions. No acute displaced fracture. Grade 1 anterolisthesis of L4 on L5 with associated degenerative changes. IMPRESSION: 1. No acute intra-abdominal or intrapelvic abnormality with limited evaluation on this noncontrast study. 2. Couple scattered colon diverticula with no acute diverticulitis. 3. Atrophic scarred kidneys. 4.  Aortic Atherosclerosis (ICD10-I70.0). Electronically Signed   By: Iven Finn M.D.   On: 10/04/2020 19:58    Microbiology: Recent Results (from the past 240 hour(s))  SARS CORONAVIRUS 2 (TAT 6-24 HRS) Nasopharyngeal Nasopharyngeal Swab     Status: None   Collection Time: 10/04/20  5:24 PM   Specimen: Nasopharyngeal Swab  Result Value Ref Range Status   SARS Coronavirus 2 NEGATIVE NEGATIVE Final    Comment: (NOTE) SARS-CoV-2 target nucleic acids are NOT DETECTED.  The SARS-CoV-2 RNA is generally detectable in upper and lower respiratory specimens during the acute phase of infection. Negative results do not preclude SARS-CoV-2 infection, do not rule out co-infections with other pathogens, and should not be used as the sole basis for treatment  or other patient management decisions. Negative results must be combined with clinical observations, patient history, and epidemiological information. The expected result is Negative.  Fact Sheet for Patients: SugarRoll.be  Fact Sheet for Healthcare Providers: https://www.woods-mathews.com/  This test is not yet approved or cleared by the [place] FDA and  has been authorized for detection and/or diagnosis of SARS-CoV-2 by FDA under an Emergency Use Authorization (EUA). This EUA will remain  in effect (meaning this test can be used) for the duration of the COVID-19 declaration under Se ction 564(b)(1) of the Act, 21 U.S.C. section 360bbb-3(b)(1), unless the authorization is terminated or revoked sooner.  Performed at [facility], [place] [address], [place], [place] [address]   Resp Panel by RT-PCR (Flu A&B, Covid) Nasopharyngeal Swab     Status: None   Collection Time: 10/11/20 10:27 AM   Specimen: Nasopharyngeal Swab; Nasopharyngeal(NP) swabs in vial transport medium  Result Value Ref Range Status   SARS Coronavirus 2 by RT PCR NEGATIVE NEGATIVE Final    Comment: (NOTE) SARS-CoV-2 target nucleic acids are NOT DETECTED.  The SARS-CoV-2 RNA is generally detectable in upper respiratory specimens during the acute phase of infection. The lowest concentration of SARS-CoV-2 viral copies this assay can detect is 138 copies/mL. A negative result does not preclude SARS-Cov-2 infection and should not be used as the sole basis for treatment or other patient management decisions. A negative result may occur with  improper specimen collection/handling, submission of specimen other than nasopharyngeal swab, presence of viral mutation(s) within the areas targeted by this assay, and inadequate number of viral copies(<138 copies/mL). A negative result must be combined with clinical observations, patient history, and epidemiological information.  The expected result is Negative.  Fact Sheet for Patients:  EntrepreneurPulse.com.au  Fact Sheet for Healthcare Providers:  IncredibleEmployment.be  This test is no t yet approved or cleared by the [place] FDA and  has been authorized for detection and/or diagnosis of SARS-CoV-2 by FDA under an Emergency Use Authorization (EUA). This EUA will remain  in effect (meaning this test can be used) for the duration of the COVID-19  declaration under Section 564(b)(1) of the Act, 21 U.S.C.section 360bbb-3(b)(1), unless the authorization is terminated  or revoked sooner.       Influenza A by PCR NEGATIVE NEGATIVE Final   Influenza B by PCR NEGATIVE NEGATIVE Final    Comment: (NOTE) The Xpert Xpress SARS-CoV-2/FLU/RSV plus assay is intended as an aid in the diagnosis of influenza from Nasopharyngeal swab specimens and should not be used as a sole basis for treatment. Nasal washings and aspirates are unacceptable for Xpert Xpress SARS-CoV-2/FLU/RSV testing.  Fact Sheet for Patients: EntrepreneurPulse.com.au  Fact Sheet for Healthcare Providers: IncredibleEmployment.be  This test is not yet approved or cleared by the [place] FDA and has been authorized for detection and/or diagnosis of SARS-CoV-2 by FDA under an Emergency Use Authorization (EUA). This EUA will remain in effect (meaning this test can be used) for the duration of the COVID-19 declaration under Section 564(b)(1) of the Act, 21 U.S.C. section 360bbb-3(b)(1), unless the authorization is terminated or revoked.  Performed at [facility], [place] [address], [place], [place] [address]      Labs: Basic Metabolic Panel: Recent Labs  Lab 10/07/20 0444 10/08/20 0457 10/09/20 0433 10/10/20 0418 10/11/20 0424  NA 147* 144 142 141 142  K 3.7 3.9 4.1 4.0 3.9  CL 118* 114* 113* 112* 109  CO2 19* 19* 21* 20* 23  GLUCOSE 85 83 105* 100* 96   BUN 48* 34* 29* 33* 40*  CREATININE 1.58* 1.33* 1.28* 1.24* 1.43*  CALCIUM 8.1* 8.3* 8.6* 8.5* 8.5*   Liver Function Tests: Recent Labs  Lab 10/06/20 0640 10/09/20 0433  AST 37 35  ALT 72* 50*  ALKPHOS 191* 154*  BILITOT 0.6 0.7  PROT 5.4* 5.8*  ALBUMIN 2.3* 2.4*   No results for input(s): LIPASE, AMYLASE in the last 168 hours. No results for input(s): AMMONIA in the last 168 hours. CBC: Recent Labs  Lab 10/06/20 0640 10/07/20 0444 10/08/20 0457 10/09/20 0433 10/10/20 0418  WBC 13.3* 12.0* 14.9* 13.7* 16.0*  HGB 10.2* 10.4* 10.3* 9.8* 9.8*  HCT 32.9* 34.1* 34.3* 32.0* 31.9*  MCV 105.1* 105.9* 107.9* 106.0* 103.6*  PLT 331 309 285 271 275   Cardiac Enzymes: No results for input(s): CKTOTAL, CKMB, CKMBINDEX, TROPONINI in the last 168 hours. BNP: BNP (last 3 results) Recent Labs    09/22/20 1426 10/04/20 1455 10/09/20 0904  BNP 424.3* 1,083.7* 3,823.5*    ProBNP (last 3 results) No results for input(s): PROBNP in the last 8760 hours.  CBG: Recent Labs  Lab 10/10/20 0737 10/10/20 1618 10/10/20 2105 10/11/20 0726 10/11/20 1108  GLUCAP 80 155* 219* 84 118*       Signed:  Oswald Hillock MD.  [facility] 10/11/2020, 3:20 PM

## 2020-10-11 NOTE — Progress Notes (Signed)
Pharmacist Heart Failure Core Measure Documentation  Assessment: Ebony Scott has an EF documented as 35-40% on 09/16/20 by ECHO.  Rationale: Heart failure patients with left ventricular systolic dysfunction (LVSD) and an EF < 40% should be prescribed an angiotensin converting enzyme inhibitor (ACEI) or angiotensin receptor blocker (ARB) at discharge unless a contraindication is documented in the medical record.  This patient is not currently on an ACEI or ARB for HF.  This note is being placed in the record in order to provide documentation that a contraindication to the use of these agents is present for this encounter.  ACE Inhibitor or Angiotensin Receptor Blocker is contraindicated (specify all that apply)  '[]'$   ACEI allergy AND ARB allergy '[]'$   Angioedema '[]'$   Moderate or severe aortic stenosis '[]'$   Hyperkalemia '[]'$   Hypotension '[]'$   Renal artery stenosis '[x]'$   Worsened renal function - improving, but continue to hold [facility] for now per cardiology   Dimple Nanas, PharmD 10/11/2020 1:59 PM

## 2020-10-11 NOTE — TOC Progression Note (Addendum)
Transition of Care [facility]) - Progression Note    Patient Details  Name: Ebony Scott MRN: [id number] Date of Birth: 1950-07-27  Transition of Care [facility]) CM/SW Contact  Markez Dowland, Juliann Pulse, RN Phone Number: 10/11/2020, 1:41 PM  Clinical Narrative:  Rogelia Rohrer [id number] auth for [facility] able to accept rep Janie following.covid ordered.  3:36p-Received auth approved from-8/23-8/25-going to [facility] rm#[id number],nsg call report tel# [phone]. PTAR called.No further CM needs.    Expected Discharge Plan: Skilled Nursing Facility Barriers to Discharge: Insurance Authorization  Expected Discharge Plan and Services Expected Discharge Plan: [place]   Discharge Planning Services: CM Consult   Living arrangements for the past 2 months: Single Family Home                                       Social Determinants of Health (SDOH) Interventions    Readmission Risk Interventions Readmission Risk Prevention Plan 09/26/2020  Transportation Screening Complete  Medication Review Press photographer) Complete  PCP or Specialist appointment within 3-5 days of discharge Complete  HRI or [place] Complete  SW Recovery Care/Counseling Consult Complete  [place] Not Applicable  Some recent data might be hidden

## 2020-10-12 ENCOUNTER — Telehealth: Payer: Self-pay | Admitting: Podiatry

## 2020-10-12 DIAGNOSIS — E119 Type 2 diabetes mellitus without complications: Secondary | ICD-10-CM | POA: Diagnosis not present

## 2020-10-12 DIAGNOSIS — I2699 Other pulmonary embolism without acute cor pulmonale: Secondary | ICD-10-CM | POA: Diagnosis not present

## 2020-10-12 DIAGNOSIS — K219 Gastro-esophageal reflux disease without esophagitis: Secondary | ICD-10-CM | POA: Diagnosis not present

## 2020-10-12 DIAGNOSIS — J449 Chronic obstructive pulmonary disease, unspecified: Secondary | ICD-10-CM | POA: Diagnosis not present

## 2020-10-12 DIAGNOSIS — N179 Acute kidney failure, unspecified: Secondary | ICD-10-CM | POA: Diagnosis not present

## 2020-10-12 DIAGNOSIS — E785 Hyperlipidemia, unspecified: Secondary | ICD-10-CM | POA: Diagnosis not present

## 2020-10-12 DIAGNOSIS — J45909 Unspecified asthma, uncomplicated: Secondary | ICD-10-CM | POA: Diagnosis not present

## 2020-10-12 DIAGNOSIS — I5040 Unspecified combined systolic (congestive) and diastolic (congestive) heart failure: Secondary | ICD-10-CM | POA: Diagnosis not present

## 2020-10-12 DIAGNOSIS — B379 Candidiasis, unspecified: Secondary | ICD-10-CM | POA: Diagnosis not present

## 2020-10-12 DIAGNOSIS — G629 Polyneuropathy, unspecified: Secondary | ICD-10-CM | POA: Diagnosis not present

## 2020-10-12 DIAGNOSIS — I509 Heart failure, unspecified: Secondary | ICD-10-CM | POA: Diagnosis not present

## 2020-10-12 DIAGNOSIS — I82409 Acute embolism and thrombosis of unspecified deep veins of unspecified lower extremity: Secondary | ICD-10-CM | POA: Diagnosis not present

## 2020-10-12 LAB — GLUCOSE, CAPILLARY: Glucose-Capillary: 166 mg/dL — ABNORMAL HIGH (ref 70–99)

## 2020-10-12 NOTE — Telephone Encounter (Signed)
Pt called stating she had a procedure completed two months ago and is still having sharp pain in her foot and toe. She would like to know if this is normal, or if she needs a follow up. Please advise.

## 2020-10-12 NOTE — Telephone Encounter (Signed)
TOC call to patient no answer.Left message on personal voice mail to call back.

## 2020-10-13 NOTE — Telephone Encounter (Signed)
Patient contacted regarding discharge from [facility] on 10/04/20.  Patient understands to follow up with provider Dr. Harrell Gave on 10/18/20 at 8 am at [facility]. Patient understands discharge instructions? yes Patient understands medications and regiment? yes Patient understands to bring all medications to this visit? yes

## 2020-10-17 DIAGNOSIS — Z7901 Long term (current) use of anticoagulants: Secondary | ICD-10-CM | POA: Diagnosis not present

## 2020-10-17 DIAGNOSIS — I2699 Other pulmonary embolism without acute cor pulmonale: Secondary | ICD-10-CM | POA: Diagnosis not present

## 2020-10-17 DIAGNOSIS — I5040 Unspecified combined systolic (congestive) and diastolic (congestive) heart failure: Secondary | ICD-10-CM | POA: Diagnosis not present

## 2020-10-17 DIAGNOSIS — R609 Edema, unspecified: Secondary | ICD-10-CM | POA: Diagnosis not present

## 2020-10-17 DIAGNOSIS — G629 Polyneuropathy, unspecified: Secondary | ICD-10-CM | POA: Diagnosis not present

## 2020-10-18 ENCOUNTER — Ambulatory Visit (HOSPITAL_BASED_OUTPATIENT_CLINIC_OR_DEPARTMENT_OTHER): Payer: Medicare PPO | Admitting: Cardiology

## 2020-10-18 ENCOUNTER — Ambulatory Visit: Payer: Medicare PPO | Admitting: Internal Medicine

## 2020-10-20 ENCOUNTER — Telehealth: Payer: Self-pay

## 2020-10-20 DIAGNOSIS — R768 Other specified abnormal immunological findings in serum: Secondary | ICD-10-CM

## 2020-10-20 DIAGNOSIS — E242 Drug-induced Cushing's syndrome: Secondary | ICD-10-CM

## 2020-10-20 NOTE — Telephone Encounter (Signed)
Referral orders placed

## 2020-10-20 NOTE — Telephone Encounter (Signed)
Pt called in requesting a referral for Rheumatologist Janeal Holmes). Pt also stated that she will d/c from nursing facility soon and would like the referral so that she can set up appt soon. Please advise.  Cb#: [phone]

## 2020-10-20 NOTE — Telephone Encounter (Signed)
Ok to place referral.

## 2020-10-26 ENCOUNTER — Ambulatory Visit: Payer: Medicare PPO | Admitting: Podiatry

## 2020-10-26 ENCOUNTER — Other Ambulatory Visit: Payer: Self-pay

## 2020-10-26 ENCOUNTER — Ambulatory Visit (INDEPENDENT_AMBULATORY_CARE_PROVIDER_SITE_OTHER): Payer: Medicare PPO | Admitting: Cardiology

## 2020-10-26 ENCOUNTER — Encounter (HOSPITAL_BASED_OUTPATIENT_CLINIC_OR_DEPARTMENT_OTHER): Payer: Self-pay | Admitting: Cardiology

## 2020-10-26 VITALS — BP 124/68 | HR 80 | Ht 61.0 in | Wt 125.7 lb

## 2020-10-26 DIAGNOSIS — E782 Mixed hyperlipidemia: Secondary | ICD-10-CM | POA: Diagnosis not present

## 2020-10-26 DIAGNOSIS — L97512 Non-pressure chronic ulcer of other part of right foot with fat layer exposed: Secondary | ICD-10-CM | POA: Diagnosis not present

## 2020-10-26 DIAGNOSIS — I2510 Atherosclerotic heart disease of native coronary artery without angina pectoris: Secondary | ICD-10-CM | POA: Diagnosis not present

## 2020-10-26 DIAGNOSIS — R0602 Shortness of breath: Secondary | ICD-10-CM

## 2020-10-26 DIAGNOSIS — M1711 Unilateral primary osteoarthritis, right knee: Secondary | ICD-10-CM | POA: Diagnosis not present

## 2020-10-26 DIAGNOSIS — M86171 Other acute osteomyelitis, right ankle and foot: Secondary | ICD-10-CM | POA: Diagnosis not present

## 2020-10-26 DIAGNOSIS — I429 Cardiomyopathy, unspecified: Secondary | ICD-10-CM | POA: Diagnosis not present

## 2020-10-26 DIAGNOSIS — Z7189 Other specified counseling: Secondary | ICD-10-CM | POA: Diagnosis not present

## 2020-10-26 DIAGNOSIS — Z09 Encounter for follow-up examination after completed treatment for conditions other than malignant neoplasm: Secondary | ICD-10-CM

## 2020-10-26 DIAGNOSIS — R600 Localized edema: Secondary | ICD-10-CM

## 2020-10-26 DIAGNOSIS — I5032 Chronic diastolic (congestive) heart failure: Secondary | ICD-10-CM

## 2020-10-26 MED ORDER — ISOSORB DINITRATE-HYDRALAZINE 20-37.5 MG PO TABS
0.5000 | ORAL_TABLET | Freq: Two times a day (BID) | ORAL | Status: DC
Start: 2020-10-26 — End: 2020-11-09

## 2020-10-26 NOTE — Patient Instructions (Addendum)
Medication Instructions:  Take 40 mg dose of furosemide today when you get home today. Can take a second dose in the afternoon if swelling does not improve.  Start BiDil, but at 1/2 pill twice a day. If you feel dizzy/lightheaded, stop this.   Continue bisoprolol.  *If you need a refill on your cardiac medications before your next appointment, please call your pharmacy*   Lab Work: None ordered today   Testing/Procedures: None ordered   Follow-Up: At [facility], you and your health needs are our priority.  As part of our continuing mission to provide you with exceptional heart care, we have created designated Provider Care Teams.  These Care Teams include your primary Cardiologist (physician) and Advanced Practice Providers (APPs -  Physician Assistants and Nurse Practitioners) who all work together to provide you with the care you need, when you need it.  We recommend signing up for the patient portal called "MyChart".  Sign up information is provided on this After Visit Summary.  MyChart is used to connect with patients for Virtual Visits (Telemedicine).  Patients are able to view lab/test results, encounter notes, upcoming appointments, etc.  Non-urgent messages can be sent to your provider as well.   To learn more about what you can do with MyChart, go to [email or web address].    Your next appointment:   1 month(s)  The format for your next appointment:   In Person  Provider:   Buford Dresser, MD

## 2020-10-26 NOTE — Progress Notes (Signed)
Cardiology Office Note:    Date:  10/26/2020   ID:  JAKERRA FLOYD, DOB 07/16/1950, MRN [id number]  PCP:  [name], MD  Cardiologist:  Buford Dresser, MD PhD  Referring MD: [name], MD   CC: follow up  History of Present Illness:    Ebony Scott is a 70 y.o. 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 Initial consult was 10/15/17 for incidental coronary artery calcification seen on CT scan.  Cardiac history: had CT chest 07/2017 which noted diffuse calcification, including aorta, aortic branch vessels, and coronary arteries. Had a normal Lexiscan in the past. No chest pain, main longstanding symptom is shortness of breath, followed by pulmonary. No history of clinical ASCVD events. History of DVT/PE. New diagnose of cardiomyopathy 08/2020.  Today: Has felt terrible over the last month, has been in and out of the hospital. We reviewed her hospitalization at length.   Reviewed medication list: -not taking insulin -has only taken 20 mg lasix on days she has appointments. Urinates more on 40 mg mg -we discussed BiDil, she has not taken these yet. Discussed starting at a lower dose, 1/2 pill twice a day to avoid hypotension. -farxiga on hold due to AKI and fungal infection  Discussed change in her EF (from >75% in 2021 to 35-40% 08/2020). Reviewed treatment options, typical workup.  Not checking BP at home.  Denies chest pain. Breathing at her baseline. No PND, orthopnea, LE edema or unexpected weight gain. No syncope or palpitations.   Past Medical History:  Diagnosis Date   Abscess of dorsum of right hand 09/22/2020   Acute deep vein thrombosis (DVT) of right lower extremity ([place]) 12/13/2017   Allergic rhinitis    Anemia    Angio-edema    Anxiety     pt denies   Arthritis    Phreesia 10/21/2019   Asthma    [employer]reesia 10/21/2019   Breast cancer ([place]) 1998   in remission   Cataract    REMOVED   Clostridium difficile colitis 01/14/2018   Clostridium difficile diarrhea 02/07/7587   Complication of anesthesia    "had hard time waking up from it several times" (02/20/2012)   Depression    "some; don't take anything for it" (02/20/2012)   Diverticulosis    DVT (deep venous thrombosis) ([place])    Fibromyalgia 11/2011   GERD (gastroesophageal reflux disease)    Graves disease    Headache(784.0)    "related to allergies; more at different times during the year" (02/20/2012)   Hemorrhoids    Hiatal hernia    back and neck   Hx of adenomatous colonic polyps 04/12/2016   Hypercholesteremia    good cholesterol is high   Hypothyroidism    IBS (irritable bowel syndrome)    Moderate persistent asthma    -FeV1 72% 2011, -IgE 102 2011, CT sinus Neg 2011   Osteoporosis    on reclast yearly   Peripheral vascular disease ([place]) 2019   DVTs   Pneumonia 04/2011; ~ 11/2011   "double; single" (02/20/2012)   Septic olecranon bursitis of right elbow 09/22/2020   Seronegative rheumatoid arthritis ([place])    Dr. Lahoma Rocker   SIRS (systemic inflammatory response syndrome) ([place]) 02/10/2018   Tracheobronchomalacia     Past Surgical History:  Procedure Laterality Date   ABDOMINAL HYSTERECTOMY  N/A    Phreesia 10/21/2019   ANTERIOR AND POSTERIOR REPAIR  1990's   APPENDECTOMY     BREAST LUMPECTOMY  1998   left   BREAST SURGERY N/A    Phreesia 10/21/2019   BRONCHIAL WASHINGS  04/05/2020   Procedure: BRONCHIAL WASHINGS;  Surgeon: Freddi Starr, MD;  Location: [facility];  Service: Pulmonary;;   CARPOMETACARPEL ([place]) FUSION OF THUMB WITH AUTOGRAFT FROM RADIUS  ~ 2009   "both thumbs" (02/20/2012)   CATARACT EXTRACTION W/ INTRAOCULAR LENS  IMPLANT, BILATERAL  2012   CERVICAL DISCECTOMY  10/2001   C5-C6   CERVICAL FUSION  2003   C3-C4    CHOLECYSTECTOMY     COLONOSCOPY     DEBRIDEMENT TENNIS ELBOW  ?1970's   right   ESOPHAGOGASTRODUODENOSCOPY     EYE SURGERY N/A    Phreesia 10/21/2019   HYSTERECTOMY     I & D EXTREMITY Right 09/22/2020   Procedure: IRRIGATION AND DEBRIDEMENT RIGHT HAND AND ELBOW;  Surgeon: Marchia Bond, MD;  Location: [facility];  Service: Orthopedics;  Laterality: Right;   KNEE ARTHROPLASTY  ?1990's   "?right; w/cartilage repair" (02/20/2012)   NASAL SEPTUM SURGERY  1980's   POSTERIOR CERVICAL FUSION/FORAMINOTOMY  2004   "failed initial fusion; rewired  anterior neck" (02/20/2012)   REVERSE SHOULDER ARTHROPLASTY Right 02/16/2020   Procedure: REVERSE SHOULDER ARTHROPLASTY;  Surgeon: Marchia Bond, MD;  Location: [facility];  Service: Orthopedics;  Laterality: Right;   SPINE SURGERY N/A    Phreesia 10/21/2019   TONSILLECTOMY  ~ 1953   VESICOVAGINAL FISTULA CLOSURE W/ TAH  1988   VIDEO BRONCHOSCOPY Bilateral 08/23/2016   Procedure: VIDEO BRONCHOSCOPY WITH FLUORO;  Surgeon: Javier Glazier, MD;  Location: [facility];  Service: Cardiopulmonary;  Laterality: Bilateral;   VIDEO BRONCHOSCOPY N/A 04/05/2020   Procedure: VIDEO BRONCHOSCOPY WITHOUT FLUORO;  Surgeon: Freddi Starr, MD;  [facility]: [facility];  Service: Pulmonary;  Laterality: N/A;    Current Medications: Current Outpatient Medications on File Prior to Visit  Medication Sig   albuterol (PROVENTIL) (2.5 MG/3ML) 0.083% nebulizer solution Take 3 mLs (2.5 mg total) by nebulization every 4 (four) hours as needed for wheezing or shortness of breath.   allopurinol (ZYLOPRIM) 100 MG tablet Take 100 mg by mouth daily.   atorvastatin (LIPITOR) 80 MG tablet Take 1 tablet (80 mg total) by mouth daily.   azelastine (ASTELIN) 0.1 % nasal spray USE 2 SPRAYS IN EACH NOSTRIL DAILY AS DIRECTED   bisoprolol (ZEBETA) 5 MG tablet Take 0.5 tablets (2.5 mg total) by mouth daily.   Blood Glucose Monitoring Suppl (BLOOD GLUCOSE SYSTEM PAK) KIT Please dispense per patient  and insurance preference. Use as directed to monitor FSBS 4x . Dx: E11.65   cholecalciferol (VITAMIN D) 25 MCG (1000 UT) tablet Take 1,000 Units by mouth daily.    Continuous Blood Gluc Receiver (FREESTYLE LIBRE 14 DAY READER) DEVI Apply 1 each topically daily as needed. USE AS DIRECTED TO MONITOR BLOOD GLUCOSE LEVELS. DX: E11.9.   Continuous Blood Gluc Sensor (FREESTYLE LIBRE 14 DAY SENSOR) MISC Apply 1 each topically every 14 (fourteen) days. USE AS DIRECTED TO MONITOR BLOOD GLUCOSE LEVELS. DX: E11.9.   cyproheptadine (PERIACTIN) 4 MG tablet TAKE 2 TABLETS BY MOUTH EVERY EVENING   DEXILANT 60 MG capsule TAKE 1 CAPSULE BY MOUTH EACH MORNING   EPINEPHrine (EPIPEN 2-PAK) 0.3 mg/0.3 mL IJ SOAJ injection Inject 0.3 mLs (0.3 mg total) into the muscle Once PRN.   famotidine (PEPCID) 20 MG tablet  Take 20 mg by mouth at bedtime.   fluticasone (FLONASE) 50 MCG/ACT nasal spray USE 2 SPRAYS IN EACH NOSTRIL DAILY   folic acid (FOLVITE) 1 MG tablet Take 1 mg by mouth daily.   furosemide (LASIX) 20 MG tablet Take 1 tablet (20 mg total) by mouth daily. (Patient taking differently: Take 60 mg by mouth daily. 2 tabs am, 1 tab pm)   gabapentin (NEURONTIN) 600 MG tablet TAKE 1 TABLET BY MOUTH DAILY   Glucose Blood (BLOOD GLUCOSE TEST STRIPS) STRP Please dispense per patient and insurance preference. Use as directed to monitor FSBS 4x . Dx: E11.65   guaiFENesin (MUCINEX) 600 MG 12 hr tablet Take 1 tablet (600 mg total) by mouth 2 (two) times daily as needed for cough or to loosen phlegm.   insulin aspart (NOVOLOG) 100 UNIT/ML injection Inject 0-9 Units into the skin 3 (three) times daily with meals. Sliding scale insulin Less than 70 initiate hypoglycemia protocol 70-120  0 units 120-150 1 unit 151-200 2 units 201-250 3 units 251-300 5 units 301-350 7 units 351-400 9 units Greater than 400 call MD   isosorbide-hydrALAZINE (BIDIL) 20-37.5 MG tablet Take 1 tablet by mouth every 8 (eight) hours.   magnesium oxide (MAG-OX)  400 MG tablet Take 400 mg by mouth daily.   meclizine (ANTIVERT) 25 MG tablet Take 25 mg by mouth 3 (three) times daily as needed for dizziness.   mirtazapine (REMERON SOL-TAB) 30 MG disintegrating tablet DISSOLVE 1 TABLET BY MOUTH EVERY NIGHT AT BEDTIME   MITIGARE 0.6 MG CAPS Take 0.6 mg by mouth every other day.   montelukast (SINGULAIR) 10 MG tablet TAKE 1 TABLET BY MOUTH DAILY   predniSONE (DELTASONE) 1 MG tablet Take 12 tablets (12 mg total) by mouth daily with breakfast.   Propylene Glycol (SYSTANE COMPLETE) 0.6 % SOLN Place 1 drop into both eyes 2 (two) times daily.   Tiotropium Bromide-Olodaterol (STIOLTO RESPIMAT) 2.5-2.5 MCG/ACT AERS Inhale 2 puffs into the lungs daily.   XARELTO 20 MG TABS tablet TAKE 1 TABLET BY MOUTH DAILY WITH SUPPER   [DISCONTINUED] potassium chloride SA (KLOR-CON) 20 MEQ tablet Take 1 tablet (20 mEq total) by mouth daily. (Patient not taking: Reported on 09/23/2020)   Current Facility-Administered Medications on File Prior to Visit  Medication   denosumab (PROLIA) injection 60 mg     Allergies:   Baclofen, Dust mite extract, Molds & smuts, Morphine and related, Other, Penicillins, Rofecoxib, Shellfish allergy, Shrimp flavor, Tetracycline hcl, Xolair [omalizumab], Zoledronic acid, Dilaudid [hydromorphone hcl], Levofloxacin, Oxycodone hcl, Paroxetine, Celecoxib, Dilaudid [hydromorphone], Diltiazem, Diltiazem hcl, Hydrocodone-acetaminophen, Lactose intolerance (gi), Morphine sulfate, Oxycodone-acetaminophen, Strawberry flavor, Tree extract, and Penicillin g procaine   Social History   Tobacco Use   Smoking status: Never    Passive exposure: Yes   Smokeless tobacco: Never   Tobacco comments:    Parents  Vaping Use   Vaping Use: Never used  Substance Use Topics   Alcohol use: Not Currently    Alcohol/week: 0.0 standard drinks   Drug use: No    Family History: The patient's family history includes Allergies in her father and mother; Arthritis in her father  and mother; Colitis in her daughter; Colon cancer in an other family member; Diabetes in her maternal grandfather and mother; Heart disease in her father and mother; Lung cancer in her mother; Stroke in her father. Father had 4V CABG and valve replacement at age 62; had a complication with ruptured aorta after surgery and passed away. Mother had  lung cancer and diabetes. No other significant heart disease or risks.   ROS:   Please see the history of present illness.  Additional pertinent ROS otherwise unremarkable.  EKGs/Labs/Other Studies Reviewed:    The following studies were reviewed today: Echo 09/16/20 1. Septal apical and anterior wall hypokinesis EF decreased compard to TTE done 07/10/19 . Left ventricular ejection fraction, by estimation, is 35 to 40%. The left ventricle has moderately decreased function. The left ventricle demonstrates regional wall motion abnormalities (see scoring diagram/findings for description). The left ventricular internal cavity size was moderately dilated. Left ventricular diastolic parameters are consistent with Grade II diastolic dysfunction (pseudonormalization). Elevated left ventricular end-diastolic pressure.   2. Right ventricular systolic function is normal. The right ventricular size is normal.   3. Left atrial size was mildly dilated.   4. The mitral valve is abnormal. Mild mitral valve regurgitation. No evidence of mitral stenosis.   5. The aortic valve is tricuspid. There is moderate calcification of the aortic valve. Aortic valve regurgitation is not visualized. Mild to moderate aortic valve sclerosis/calcification is present, without any evidence of aortic stenosis.   6. The inferior vena cava is normal in size with greater than 50% respiratory variability, suggesting right atrial pressure of 3 mmHg.   FINDINGS   Left Ventricle: Septal apical and anterior wall hypokinesis EF decreased compard to TTE done 07/10/19.  Echo 07/10/19 1. Hyperdynamic LV  function; grade 1 diastolic dysfunction; moderate LVH;  trace AI.   2. Left ventricular ejection fraction, by estimation, is >75%. The left  ventricle has hyperdynamic function. The left ventricle has no regional  wall motion abnormalities. There is moderate left ventricular hypertrophy.  Left ventricular diastolic  parameters are consistent with Grade I diastolic dysfunction (impaired  relaxation).   3. Right ventricular systolic function is normal. The right ventricular  size is normal.   4. The mitral valve is normal in structure. No evidence of mitral valve  regurgitation. No evidence of mitral stenosis.   5. The aortic valve has an indeterminant number of cusps. Aortic valve  regurgitation is trivial. No aortic stenosis is present.  Echo 08/05/17 Study Conclusions   - Left ventricle: The cavity size was normal. There was mild focal   basal hypertrophy of the septum. Systolic function was vigorous.   The estimated ejection fraction was in the range of 65% to 70%.   Wall motion was normal; there were no regional wall motion   abnormalities. Doppler parameters are consistent with abnormal   left ventricular relaxation (grade 1 diastolic dysfunction).   Impressions:   - Definity used; vigorous LV systolic function; mild diastolic   dysfunction.  CT chest high resolution 6.17.19 FINDINGS: Cardiovascular: Heart size is normal. There is no significant pericardial fluid, thickening or pericardial calcification. There is aortic atherosclerosis, as well as atherosclerosis of the great vessels of the mediastinum and the coronary arteries, including calcified atherosclerotic plaque in the left main, left anterior descending left circumflex coronary arteries.  IMPRESSION: 1. No findings to suggest interstitial lung disease. 2. Mild air trapping indicative of mild small airways disease. 3. Aortic atherosclerosis, in addition to left main and 2 vessel coronary artery disease. Please  note that although the presence of coronary artery calcium documents the presence of coronary artery disease, the severity of this disease and any potential stenosis cannot be assessed on this non-gated CT examination. Assessment for potential risk factor modification, dietary therapy or pharmacologic therapy may be warranted, if clinically indicated. 4. Additional incidental  findings, as above.  Stress 2011:  Stress echo 11/2009 Study Conclusions - Stress ECG conclusions: There were no stress arrhythmias or conduction abnormalities. The stress ECG was normal. - Staged echo: There was no echocardiographic evidence for stress-induced ischemia. Impressions: - Nondiagnostic stress echocardiogram with nochest pain, no ST changes; patient did not achieve target heart rate; no   stress-induced wall motion abnormalities.  Had [place] 12/2009 due to nondiagnostic stress echo: Stress Procedure  The patient received IV Lexiscan 0.4 mg over 15-seconds with concurrent low level exercise and then Technetium 35mTetrofosmin was injected at 30-seconds while the patient continued walking one more minute.  There were no significant changes with Lexiscan.  Quantitative spect images were obtained after a 45 minute delay.   QPS Raw Data Images:  Normal; no motion artifact; normal heart/lung ratio. Stress Images:  Normal homogeneous uptake in all areas of the myocardium. Rest Images:  Normal homogeneous uptake in all areas of the myocardium. Subtraction (SDS):  No evidence of ischemia. Transient Ischemic Dilatation:  1.15  (Normal <1.22)  Lung/Heart Ratio:  .27  (Normal <0.45)   Quantitative Gated Spect Images QGS EDV:  52 ml QGS ESV:  13 ml QGS EF:  75 % QGS cine images:  Normal motion   Findings Normal nuclear study  EKG:  EKG is personally reviewed today.  10/26/20 NSR at 80 bpm, PRWP, borderline LVH 02/23/20 sinus tachycardia a 106 bpm, lack of anterior forces V1/V2  Recent Labs: 09/23/2020: TSH  0.983 10/04/2020: Magnesium 3.1 10/09/2020: ALT 50; B Natriuretic Peptide 3,823.5 10/10/2020: Hemoglobin 9.8; Platelets 275 10/11/2020: BUN 40; Creatinine, Ser 1.43; Potassium 3.9; Sodium 142  Recent Lipid Panel    Component Value Date/Time   CHOL 318 (H) 09/23/2020 1253   TRIG 475 (H) 09/23/2020 1253   HDL 38 (L) 09/23/2020 1253   CHOLHDL 8.4 09/23/2020 1253   VLDL UNABLE TO CALCULATE IF TRIGLYCERIDE OVER 400 mg/dL 09/23/2020 1253   LDLCALC UNABLE TO CALCULATE IF TRIGLYCERIDE OVER 400 mg/dL 09/23/2020 1253   LDLCALC 77 07/04/2018 1017   LDLDIRECT 141.2 (H) 09/23/2020 1253    Physical Exam:    VS:  BP 124/68   Pulse 80   Ht 5' 1" (1.549 m)   Wt 125 lb 11.2 oz (57 kg)   SpO2 96%   BMI 23.75 kg/m     Wt Readings from Last 3 Encounters:  10/26/20 125 lb 11.2 oz (57 kg)  10/11/20 127 lb 10.3 oz (57.9 kg)  09/30/20 141 lb 12.1 oz (64.3 kg)    GEN: frail appearing, on O2 by nasal cannula, dyspneic with minimal exertion HEENT: Normal, moist mucous membranes NECK: No JVD CARDIAC: regular rhythm, normal S1 and S2, no rubs or gallops. No murmur. VASCULAR: Radial and DP pulses 2+ bilaterally. No carotid bruits RESPIRATORY:  Bronchial breath sounds with mild rattling ABDOMEN: Soft, non-tender, non-distended MUSCULOSKELETAL:  Ambulates independently with walker SKIN: Warm and dry, bilateral 1+ ankle edema NEUROLOGIC:  Alert and oriented x 3. No focal neuro deficits noted. PSYCHIATRIC:  Normal affect   ASSESSMENT:    1. Hospital discharge follow-up   2. Cardiomyopathy, unspecified type ([place]   3. SOB (shortness of breath)   4. Bilateral leg edema   5. Coronary artery calcification seen on CT scan   6. Mixed hyperlipidemia   7. Encounter for education about heart failure     PLAN:    New diagnosis of cardiomyopathy Recent hospitalization for acute renal failure, acute systolic and diastolic heart failure -echo 06/2019  with hyperdynamic EF and only grade 1 diastolic  dysfunction, then 08/2020 with reduced EF -has not started isosorbide-hydralazine prescription yet. With blood pressure ~120s/70s, need to start gradually. Will start by having her take 1/2 of the bidil 20-27.5 mg tabs BID. She will contact me if she has any dizziness/lightheadedness with this -has edema today. Will give second dose of lasix today, needs to take the 40 mg daily and take second daily dose of 40 mg for swelling or weight gain -farxiga on hold due to AKI and fungal infection -ACEI/ARB/ARNI/MRA on hold due to recent kidney injury -will need ischemic evaluation, cath on hold due to recent kidney injury -counseled on heart failure education -counseled on daily weights, salt avoidance, leg elevation and compression -has upcoming labs tomorrow to follow CBC, BMET  Coronary calcium seen on CT scan: suggests CAD equivalent -asymptomatic -prior negative nuclear stress -now with reduced EF and wall motion changes -will need cath once renal function has stabilized, R/LHC -counseled on prevention, below  Mixed hyperlipidemia with goal LDL <70 -lipids from 09/23/20 reviewed. Tchol significantly elevated, had been 192, now 318. TG up from 200s to 475. HDL dropped from 84 to 38. LDL cannot be calculated -continue atorvastatin 80 mg, recheck at follow up, must be fasting  Secondary prevention: -recommend heart healthy/Mediterranean diet, with whole grains, fruits, vegetable, fish, lean meats, nuts, and olive oil. Limit salt. -recommend moderate walking, 3-5 times/week for 30-50 minutes each session. Aim for at least 150 minutes.week. Goal should be pace of 3 miles/hours, or walking 1.5 miles in 30 minutes -recommend avoidance of tobacco products. Avoid excess alcohol.  Plan for follow up:1 month or sooner PRN  Buford Dresser, MD, PhD, [facility]   Medication Adjustments/Labs and Tests Ordered: Current medicines are reviewed at length with the patient today.   Concerns regarding medicines are outlined above.  Orders Placed This Encounter  Procedures   EKG 12-Lead    Meds ordered this encounter  Medications   isosorbide-hydrALAZINE (BIDIL) 20-37.5 MG tablet    Sig: Take 0.5 tablets by mouth 2 (two) times daily.     Patient Instructions  Medication Instructions:  Take 40 mg dose of furosemide today when you get home today. Can take a second dose in the afternoon if swelling does not improve.  Start BiDil, but at 1/2 pill twice a day. If you feel dizzy/lightheaded, stop this.   Continue bisoprolol.  *If you need a refill on your cardiac medications before your next appointment, please call your pharmacy*   Lab Work: None ordered today   Testing/Procedures: None ordered   Follow-Up: At [facility], you and your health needs are our priority.  As part of our continuing mission to provide you with exceptional heart care, we have created designated Provider Care Teams.  These Care Teams include your primary Cardiologist (physician) and Advanced Practice Providers (APPs -  Physician Assistants and Nurse Practitioners) who all work together to provide you with the care you need, when you need it.  We recommend signing up for the patient portal called "MyChart".  Sign up information is provided on this After Visit Summary.  MyChart is used to connect with patients for Virtual Visits (Telemedicine).  Patients are able to view lab/test results, encounter notes, upcoming appointments, etc.  Non-urgent messages can be sent to your provider as well.   To learn more about what you can do with MyChart, go to [email or web address].    Your next appointment:  1 month(s)  The format for your next appointment:   In Person  Provider:   Buford Dresser, MD     Signed, Buford Dresser, MD PhD 10/26/2020   [place]

## 2020-10-27 ENCOUNTER — Ambulatory Visit: Payer: Medicare PPO | Admitting: Family Medicine

## 2020-10-27 ENCOUNTER — Ambulatory Visit (INDEPENDENT_AMBULATORY_CARE_PROVIDER_SITE_OTHER): Payer: Medicare PPO | Admitting: Internal Medicine

## 2020-10-27 ENCOUNTER — Encounter: Payer: Self-pay | Admitting: Internal Medicine

## 2020-10-27 ENCOUNTER — Other Ambulatory Visit: Payer: Self-pay

## 2020-10-27 VITALS — BP 130/60 | HR 96 | Temp 99.0°F | Wt 129.8 lb

## 2020-10-27 VITALS — BP 93/58 | HR 86 | Temp 97.8°F

## 2020-10-27 DIAGNOSIS — R601 Generalized edema: Secondary | ICD-10-CM

## 2020-10-27 DIAGNOSIS — B379 Candidiasis, unspecified: Secondary | ICD-10-CM

## 2020-10-27 DIAGNOSIS — N1832 Chronic kidney disease, stage 3b: Secondary | ICD-10-CM | POA: Diagnosis not present

## 2020-10-27 DIAGNOSIS — L02511 Cutaneous abscess of right hand: Secondary | ICD-10-CM | POA: Diagnosis not present

## 2020-10-27 DIAGNOSIS — M71121 Other infective bursitis, right elbow: Secondary | ICD-10-CM

## 2020-10-27 DIAGNOSIS — J4550 Severe persistent asthma, uncomplicated: Secondary | ICD-10-CM | POA: Diagnosis not present

## 2020-10-27 DIAGNOSIS — I5042 Chronic combined systolic (congestive) and diastolic (congestive) heart failure: Secondary | ICD-10-CM

## 2020-10-27 LAB — FUNGUS CULTURE WITH STAIN

## 2020-10-27 LAB — COMPLETE METABOLIC PANEL WITH GFR
AG Ratio: 1.7 (calc) (ref 1.0–2.5)
ALT: 79 U/L — ABNORMAL HIGH (ref 6–29)
AST: 61 U/L — ABNORMAL HIGH (ref 10–35)
Albumin: 3.8 g/dL (ref 3.6–5.1)
Alkaline phosphatase (APISO): 134 U/L (ref 37–153)
BUN/Creatinine Ratio: 32 (calc) — ABNORMAL HIGH (ref 6–22)
BUN: 53 mg/dL — ABNORMAL HIGH (ref 7–25)
CO2: 28 mmol/L (ref 20–32)
Calcium: 9.5 mg/dL (ref 8.6–10.4)
Chloride: 95 mmol/L — ABNORMAL LOW (ref 98–110)
Creat: 1.68 mg/dL — ABNORMAL HIGH (ref 0.60–1.00)
Globulin: 2.3 g/dL (calc) (ref 1.9–3.7)
Glucose, Bld: 241 mg/dL — ABNORMAL HIGH (ref 65–99)
Potassium: 4.2 mmol/L (ref 3.5–5.3)
Sodium: 138 mmol/L (ref 135–146)
Total Bilirubin: 0.6 mg/dL (ref 0.2–1.2)
Total Protein: 6.1 g/dL (ref 6.1–8.1)
eGFR: 32 mL/min/{1.73_m2} — ABNORMAL LOW (ref >=60)

## 2020-10-27 LAB — CBC WITH DIFFERENTIAL/PLATELET
Absolute Monocytes: 767 cells/uL (ref 200–950)
Basophils Absolute: 43 cells/uL (ref 0–200)
Basophils Relative: 0.4 %
Eosinophils Absolute: 22 cells/uL (ref 15–500)
Eosinophils Relative: 0.2 %
HCT: 35.7 % (ref 35.0–45.0)
Hemoglobin: 11.5 g/dL — ABNORMAL LOW (ref 11.7–15.5)
Lymphs Abs: 464 cells/uL — ABNORMAL LOW (ref 850–3900)
MCH: 31.7 pg (ref 27.0–33.0)
MCHC: 32.2 g/dL (ref 32.0–36.0)
MCV: 98.3 fL (ref 80.0–100.0)
MPV: 11.8 fL (ref 7.5–12.5)
Monocytes Relative: 7.1 %
Neutro Abs: 9504 cells/uL — ABNORMAL HIGH (ref 1500–7800)
Neutrophils Relative %: 88 %
Platelets: 211 10*3/uL (ref 140–400)
RBC: 3.63 10*6/uL — ABNORMAL LOW (ref 3.80–5.10)
RDW: 16.1 % — ABNORMAL HIGH (ref 11.0–15.0)
Total Lymphocyte: 4.3 %
WBC: 10.8 10*3/uL (ref 3.8–10.8)

## 2020-10-27 LAB — FUNGAL ORGANISM REFLEX

## 2020-10-27 LAB — FUNGUS CULTURE RESULT

## 2020-10-27 NOTE — Progress Notes (Signed)
Subjective:    Patient ID: PATRISIA FAETH, female    DOB: October 28, 1950, 70 y.o.   MRN: [id number]  HPI Admit date: 10/04/2020 Discharge date: 10/11/2020    Recommendations for Outpatient Follow-up:  Follow-up cardiology in 1 week Follow-up orthopedics, Dr. Mardelle Matte as outpatient1 week Continue daily dressing changes, ok to use small mepilexes. Patient will follow up 1 week with orthopedics  Obtain BMP in 1 week   Discharge Diagnoses:  Principal Problem:   ARF (acute renal failure) ([place]) Active Problems:   Chronic combined systolic and diastolic CHF (congestive heart failure) (HCC)   Chronic kidney disease, stage 3b (HCC)   DVT (deep venous thrombosis) (HCC)   Rheumatoid arthritis (HCC)   DMII (diabetes mellitus, type 2) (HCC)     History of present illness:    70 year old female with history of diffuse aorta and coronary calcifications noted on chest CT on 07/2017, chronic combined CHF with a EF of 35 to 40% on recent echo, SVT, prior PE on lifelong anticoagulation, asthma, tracheobronchomalacia on CPAP, bronchiectasis, vocal cord dysfunction, seronegative rheumatoid arthritis, CKD stage III who was recently mated to the hospital for septic right elbow olecranon bursitis and cellulitis of right hand.  She underwent incision and drainage on 09/22/2020.  Cardiology was consulted, and she was started on Entresto and bisoprolol.  Discharged on 09/30/2020.   Patient was readmitted with AKI, creatinine of 4.07.  Her creatinine was 1.8 on discharge on 11th August   Hospital Course:    Acute on chronic kidney disease stage III -Baseline creatinine around 1.6-1.8 -Presented with creatinine of 4.07; likely from Entresto and hypotension -[name] is currently on hold -Started on IV normal saline; creatinine improved to 1.43 -IV fluids were discontinued       Acute on chronic  combined systolic and diastolic CHF -Last EF was 35 to 40% in July 2022 -Entresto and bisoprolol were held -Cardiology  has restarted bisoprolol, Entresto is still on hold -Diuretics on hold -Repeat BNP showed elevation at 3823.5 -Diuresed 1.5 L with low-dose Lasix 20 mg daily  -Continue Lasix 20 mg p.o. daily.   -Started on BiDil 1 tablet p.o. 3 times daily by cardiology -Cardiology has signed off, recommendations per cardiology include Medication Recommendations:  Atorvastatin 41m daily, Bisoprolol 2.573mdaily, Lasix 2014maily, Bidil 20-37.5mg90mD ( may need 2 tabs TID if BP remains uncontrolled),  Other recommendations (labs, testing, etc):  BMET in 1 week Follow up as an outpatient:  Followup with Dr. ChriHarrell Gave1-2 weeks      COPD exacerbation -Resolved, -Continue Brovana, Incruse Ellipta -As needed albuterol nebulizer --Continue 2 L/min oxygen at bedtime   Diabetes mellitus type 2 -Hemoglobin A1c was 8.1, 2 weeks ago -CBGs elevated due to steroids -Steroids have been weaned off to prednisone 20 mg p.o. daily -Continue taking prednisone 12 mg daily -Continue sensitive sliding scale insulin with NovoLog     History of rheumatoid arthritis -Patient is on prednisone and Arava at home -Started on Solu-Cortef stress dose 50 mg every 8 hours -Solu-Cortef has been weaned off -Patient takes prednisone 12 mg daily at home     History of DVT/PE -Patient is on Xarelto at home -[place]   Recent septic olecranon bursitis -S/p debridement during last admission -Culture grew Candida albicans; started on fluconazole till October 20, 2020 -Patient to follow-up with orthopedic surgery on September 2   Leukocytosis -Likely from steroids  10/27/20 Here for follow up.  There seems to be a fair amount of confusion regarding her medication list.  The medicines from the hospital and the medicines from the nursing home are contradictory.  Therefore we spent 30 minutes today going over her medication list and trying to simplify.  Her blood pressure  this morning at her infectious disease specialist was barely above 90 systolic.  This afternoon is better but she has been rushing to get here.  Come to find out she has not been taking her bisoprolol for the last 3 days since leaving the nursing home.  Despite that her blood pressure this morning was still extremely low.  She has recently increased her Lasix to 40 mg a day up from 20.  Her A1c in the hospital was 8.4.  She would be an excellent candidate for Jardiance if her blood pressure and kidneys can tolerate it.  Today her lungs on exam sound clear with no wheezes or crackles or rails.  She does have +2 edema distal to the knees in both legs however I do not believe that she can tolerate increased diuresis due to hypotension   Past Medical History:  Diagnosis Date   Abscess of dorsum of right hand 09/22/2020   Acute deep vein thrombosis (DVT) of right lower extremity ([place]) 12/13/2017   Allergic rhinitis    Anemia    Angio-edema    Anxiety    pt denies   Arthritis    Phreesia 10/21/2019   Asthma    Phreesia 10/21/2019   Breast cancer ([place]) 1998   in remission   Cataract    REMOVED   Clostridium difficile colitis 01/14/2018   Clostridium difficile diarrhea 75/11/2583   Complication of anesthesia    "had hard time waking up from it several times" (02/20/2012)   Depression    "some; don't take anything for it" (02/20/2012)   Diverticulosis    DVT (deep venous thrombosis) ([place])    Fibromyalgia 11/2011   GERD (gastroesophageal reflux disease)    Graves disease    Headache(784.0)    "related to allergies; more at different times during the year" (02/20/2012)   Hemorrhoids    Hiatal hernia    back and neck   Hx of adenomatous colonic polyps 04/12/2016   Hypercholesteremia    good cholesterol is high   Hypothyroidism    IBS (irritable bowel syndrome)    Moderate persistent asthma    -FeV1 72% 2011, -IgE 102 2011, CT sinus Neg 2011   Osteoporosis    on reclast yearly   Peripheral  vascular disease ([place]) 2019   DVTs   Pneumonia 04/2011; ~ 11/2011   "double; single" (02/20/2012)   Septic olecranon bursitis of right elbow 09/22/2020   Seronegative rheumatoid arthritis ([place])    Dr. Lahoma Rocker   SIRS (systemic inflammatory response syndrome) ([place]) 02/10/2018   Tracheobronchomalacia    Past Surgical History:  Procedure Laterality Date   ABDOMINAL HYSTERECTOMY N/A    Phreesia 10/21/2019   ANTERIOR AND POSTERIOR REPAIR  1990's   APPENDECTOMY     BREAST LUMPECTOMY  1998   left   BREAST SURGERY N/A    Phreesia 10/21/2019   BRONCHIAL WASHINGS  04/05/2020   Procedure: BRONCHIAL WASHINGS;  Surgeon: Freddi Starr, MD;  Location: [facility];  Service: Pulmonary;;   CARPOMETACARPEL ([place]) FUSION OF THUMB WITH AUTOGRAFT FROM RADIUS  ~ 2009   "both thumbs" (02/20/2012)   CATARACT EXTRACTION W/ INTRAOCULAR LENS  IMPLANT, BILATERAL  2012   CERVICAL DISCECTOMY  10/2001   C5-C6   CERVICAL FUSION  2003   C3-C4   CHOLECYSTECTOMY     COLONOSCOPY     DEBRIDEMENT TENNIS ELBOW  ?1970's   right   ESOPHAGOGASTRODUODENOSCOPY     EYE SURGERY N/A    Phreesia 10/21/2019   HYSTERECTOMY     I & D EXTREMITY Right 09/22/2020   Procedure: IRRIGATION AND DEBRIDEMENT RIGHT HAND AND ELBOW;  Surgeon: Marchia Bond, MD;  Location: [facility];  Service: Orthopedics;  Laterality: Right;   KNEE ARTHROPLASTY  ?1990's   "?right; w/cartilage repair" (02/20/2012)   NASAL SEPTUM SURGERY  1980's   POSTERIOR CERVICAL FUSION/FORAMINOTOMY  2004   "failed initial fusion; rewired  anterior neck" (02/20/2012)   REVERSE SHOULDER ARTHROPLASTY Right 02/16/2020   Procedure: REVERSE SHOULDER ARTHROPLASTY;  Surgeon: Marchia Bond, MD;  Location: [facility];  Service: Orthopedics;  Laterality: Right;   SPINE SURGERY N/A    Phreesia 10/21/2019   TONSILLECTOMY  ~ 1953   VESICOVAGINAL FISTULA CLOSURE W/ TAH  1988   VIDEO BRONCHOSCOPY Bilateral 08/23/2016   Procedure: VIDEO BRONCHOSCOPY WITH FLUORO;  Surgeon: Javier Glazier, MD;  Location: [facility];  Service: Cardiopulmonary;  Laterality: Bilateral;   VIDEO BRONCHOSCOPY N/A 04/05/2020   Procedure: VIDEO BRONCHOSCOPY WITHOUT FLUORO;  Surgeon: Freddi Starr, MD;  Location: [facility];  Service: Pulmonary;  Laterality: N/A;   Current Outpatient Medications on File Prior to Visit  Medication Sig Dispense Refill   albuterol (PROVENTIL) (2.5 MG/3ML) 0.083% nebulizer solution Take 3 mLs (2.5 mg total) by nebulization every 4 (four) hours as needed for wheezing or shortness of breath. 75 mL 12   allopurinol (ZYLOPRIM) 100 MG tablet Take 100 mg by mouth daily.     atorvastatin (LIPITOR) 80 MG tablet Take 1 tablet (80 mg total) by mouth daily.     azelastine (ASTELIN) 0.1 % nasal spray USE 2 SPRAYS IN EACH NOSTRIL DAILY AS DIRECTED 30 mL 5   bisoprolol (ZEBETA) 5 MG tablet Take 0.5 tablets (2.5 mg total) by mouth daily.     Blood Glucose Monitoring Suppl (BLOOD GLUCOSE SYSTEM PAK) KIT Please dispense per patient and insurance preference. Use as directed to monitor FSBS 4x . Dx: E11.65 1 kit 0   cholecalciferol (VITAMIN D) 25 MCG (1000 UT) tablet Take 1,000 Units by mouth daily.      Continuous Blood Gluc Receiver (FREESTYLE LIBRE 14 DAY READER) DEVI Apply 1 each topically daily as needed. USE AS DIRECTED TO MONITOR BLOOD GLUCOSE LEVELS. DX: E11.9. 1 each 1   Continuous Blood Gluc Sensor (FREESTYLE LIBRE 14 DAY SENSOR) MISC Apply 1 each topically every 14 (fourteen) days. USE AS DIRECTED TO MONITOR BLOOD GLUCOSE LEVELS. DX: E11.9. 2 each 11   cyproheptadine (PERIACTIN) 4 MG tablet TAKE 2 TABLETS BY MOUTH EVERY EVENING 180 tablet 2   DEXILANT 60 MG capsule TAKE 1 CAPSULE BY MOUTH EACH MORNING 90 capsule 3   EPINEPHrine (EPIPEN 2-PAK) 0.3 mg/0.3 mL IJ SOAJ injection Inject 0.3 mLs (0.3 mg total) into the muscle Once PRN. 0.3 mL 2   famotidine (PEPCID) 20 MG tablet Take 20 mg by mouth at bedtime.     fluticasone (FLONASE) 50 MCG/ACT nasal spray USE 2 SPRAYS IN  EACH NOSTRIL DAILY 16 g 2   folic acid (FOLVITE) 1 MG tablet Take 1 mg by mouth daily.     furosemide (LASIX) 20 MG tablet Take 1 tablet (20 mg total) by mouth daily. (Patient taking differently: Take 60 mg by mouth  daily. 2 tabs am, 1 tab pm) 30 tablet    gabapentin (NEURONTIN) 600 MG tablet TAKE 1 TABLET BY MOUTH DAILY 90 tablet 3   Glucose Blood (BLOOD GLUCOSE TEST STRIPS) STRP Please dispense per patient and insurance preference. Use as directed to monitor FSBS 4x . Dx: E11.65 150 strip 1   guaiFENesin (MUCINEX) 600 MG 12 hr tablet Take 1 tablet (600 mg total) by mouth 2 (two) times daily as needed for cough or to loosen phlegm. 60 tablet 1   insulin aspart (NOVOLOG) 100 UNIT/ML injection Inject 0-9 Units into the skin 3 (three) times daily with meals. Sliding scale insulin Less than 70 initiate hypoglycemia protocol 70-120  0 units 120-150 1 unit 151-200 2 units 201-250 3 units 251-300 5 units 301-350 7 units 351-400 9 units Greater than 400 call MD 10 mL 11   isosorbide-hydrALAZINE (BIDIL) 20-37.5 MG tablet Take 0.5 tablets by mouth 2 (two) times daily.     magnesium oxide (MAG-OX) 400 MG tablet Take 400 mg by mouth daily.     meclizine (ANTIVERT) 25 MG tablet Take 25 mg by mouth 3 (three) times daily as needed for dizziness.     mirtazapine (REMERON SOL-TAB) 30 MG disintegrating tablet DISSOLVE 1 TABLET BY MOUTH EVERY NIGHT AT BEDTIME 90 tablet 1   MITIGARE 0.6 MG CAPS Take 0.6 mg by mouth every other day.     montelukast (SINGULAIR) 10 MG tablet TAKE 1 TABLET BY MOUTH DAILY 30 tablet 5   predniSONE (DELTASONE) 1 MG tablet Take 12 tablets (12 mg total) by mouth daily with breakfast.     Propylene Glycol (SYSTANE COMPLETE) 0.6 % SOLN Place 1 drop into both eyes 2 (two) times daily.     Tiotropium Bromide-Olodaterol (STIOLTO RESPIMAT) 2.5-2.5 MCG/ACT AERS Inhale 2 puffs into the lungs daily. 4 g 5   XARELTO 20 MG TABS tablet TAKE 1 TABLET BY MOUTH DAILY WITH SUPPER 90 tablet 3   [DISCONTINUED]  potassium chloride SA (KLOR-CON) 20 MEQ tablet Take 1 tablet (20 mEq total) by mouth daily. (Patient not taking: Reported on 09/23/2020) 30 tablet 3   Current Facility-Administered Medications on File Prior to Visit  Medication Dose Route Frequency Provider Last Rate Last Admin   denosumab (PROLIA) injection 60 mg  60 mg Subcutaneous Q6 months Dysen Edmondson T, MD   60 mg at 03/31/20 1224   Allergies  Allergen Reactions   Baclofen Other (See Comments)    Altered mental status requiring a 3-day hospital stay- patient became unresponsive   Dust Mite Extract Shortness Of Breath and Other (See Comments)    "sneezing" (02/20/2012)   Molds & Smuts Shortness Of Breath   Morphine And Related Hives and Itching   Other Shortness Of Breath and Other (See Comments)    Grass and weeds "sneezing; filled sinuses" (02/20/2012)   Penicillins Rash and Other (See Comments)    "welts" (02/20/2012) Tolerated Ancef 02/16/20.    Rofecoxib Swelling and Other (See Comments)    Vioxx- feet swelling    Shellfish Allergy Anaphylaxis, Shortness Of Breath, Itching and Rash   Shrimp Flavor Anaphylaxis and Other (See Comments)    ALL SHELLFISH, also   Tetracycline Hcl Nausea And Vomiting   Xolair [Omalizumab] Other (See Comments)    Caused Blood clot   Zoledronic Acid Other (See Comments)    Fever, Put in hospital, dr said it was a reaction from a reaction  Other reaction(s): Unknown   Dilaudid [Hydromorphone Hcl] Itching   Levofloxacin Other (  See Comments)    GI upset   Oxycodone Hcl Nausea And Vomiting   Paroxetine Nausea And Vomiting and Other (See Comments)    Paxil    Celecoxib Swelling and Other (See Comments)    Feet swelling   Dilaudid [Hydromorphone] Other (See Comments)    "Allergic," per MAR   Diltiazem Swelling   Diltiazem Hcl Swelling   Hydrocodone-Acetaminophen Nausea And Vomiting and Other (See Comments)    "Allergic," per MAR   Lactose Intolerance (Gi) Other (See Comments)    Bloating and  gas   Morphine Sulfate Other (See Comments)    "Gets loopy"   Oxycodone-Acetaminophen Other (See Comments)    Reaction??   Strawberry Flavor Other (See Comments)    Reaction?   Tree Extract Other (See Comments)    "tested and told I was allergic to it; never experienced a reaction to it" (02/20/2012)   Penicillin G Procaine Rash and Other (See Comments)    Welts, also   Social History   Socioeconomic History   Marital status: Divorced    Spouse name: Not on file   Number of children: 2   Years of education: college   Highest education level: Not on file  Occupational History   Occupation: Disabled    Comment: Retired Engineer, production: RETIRED  Tobacco Use   Smoking status: Never    Passive exposure: Yes   Smokeless tobacco: Never   Tobacco comments:    Parents  Vaping Use   Vaping Use: Never used  Substance and Sexual Activity   Alcohol use: Not Currently    Alcohol/week: 0.0 standard drinks   Drug use: No   Sexual activity: Never  Other Topics Concern   Not on file  Social History Narrative   Patient lives at home alone. Patient  divorced.    Patient has her BS degree.   Right handed.   Caffeine- sometimes coffee.      [facility]:   Born in [place], [place]. She worked as a Copywriter, advertising. She has no pets currently. She does have indoor plants. Previously had mold in her home that was remediated. Carpet was removed.          Social Determinants of Health   Financial Resource Strain: Low Risk    Difficulty of Paying Living Expenses: Not hard at all  Food Insecurity: No Food Insecurity   Worried About Charity fundraiser in the Last Year: Never true   [place] in the Last Year: Never true  Transportation Needs: No Transportation Needs   Lack of Transportation (Medical): No   Lack of Transportation (Non-Medical): No  Physical Activity: Inactive   Days of Exercise per Week: 0 days   Minutes of Exercise per Session: 0 min   Stress: No Stress Concern Present   Feeling of Stress : Not at all  Social Connections: Moderately Isolated   Frequency of Communication with Friends and Family: More than three times a week   Frequency of Social Gatherings with Friends and Family: More than three times a week   Attends Religious Services: Never   Marine scientist or Organizations: Yes   Attends Archivist Meetings: Never   Marital Status: Divorced  Human resources officer Violence: Not At Risk   Fear of Current or Ex-Partner: No   Emotionally Abused: No   Physically Abused: No   Sexually Abused: No     Review of Systems  Objective:   Physical Exam Vitals reviewed.  Constitutional:      General: She is not in acute distress.    Appearance: Normal appearance. She is not ill-appearing or toxic-appearing.  HENT:     Right Ear: Tympanic membrane and ear canal normal.     Left Ear: Tympanic membrane and ear canal normal.     Nose: No congestion or rhinorrhea.  Cardiovascular:     Rate and Rhythm: Normal rate and regular rhythm.     Heart sounds: Normal heart sounds.  Pulmonary:     Effort: Pulmonary effort is normal. No respiratory distress.     Breath sounds: No stridor or decreased air movement. Wheezing present. No rales.  Abdominal:     General: Bowel sounds are normal. There is no distension.     Palpations: Abdomen is soft.     Tenderness: There is no abdominal tenderness. There is no guarding.  Musculoskeletal:     Right lower leg: Edema present.     Left lower leg: Edema present.  Skin:    Findings: No erythema.  Neurological:     Mental Status: She is alert and oriented to person, place, and time.     Cranial Nerves: No cranial nerve deficit.     Sensory: No sensory deficit.     Motor: No weakness.     Coordination: Coordination normal.  Moon facies, buffalo hump       Assessment & Plan:  Chronic combined systolic and diastolic CHF (congestive heart failure) (HCC) - Plan: CBC  with Differential/Platelet, COMPLETE METABOLIC PANEL WITH GFR  Severe persistent asthma without complication  Generalized edema  Stage 3b chronic kidney disease ([place]) - Plan: CBC with Differential/Platelet, COMPLETE METABOLIC PANEL WITH GFR  Septic olecranon bursitis of right elbow We tried to simplify her medication list.  First for her anticoagulant she will be on Xarelto once daily.  Second for her COPD/asthma she will be on Stiolto 2 puffs inhaled daily with as needed albuterol.  Third for her congestive heart failure she will be on BiDil 1/2 tablet twice daily.  Due to hypotension were going to hold bisoprolol for now and recheck her blood pressure next week and decide if she can resume bisoprolol.  Decrease Lasix to 20 mg a day due to hypotension.  Regarding her chronic pain, I think reducing her overall medication would help prevent dizziness and fall so of asked her to try to wean off and discontinue gabapentin which she is taking for neuropathic pain.  This may also help some of the swelling.  Of asked her to use tramadol twice daily as needed for pain.  Third regarding her rheumatoid arthritis she will take prednisone 12 mg a day.  Regarding her allergies she will be on Astelin, Singulair, Flonase.  Regarding her gout she will be taking allopurinol 100 mg daily and use colchicine only as needed.  Recheck blood pressure next week.  Meanwhile today check CBC and CMP.

## 2020-10-27 NOTE — Progress Notes (Signed)
[facility]  CHIEF COMPLAINT:    Follow up for septic bursitis  SUBJECTIVE:    Ebony Scott is a 70 y.o. female with PMHx as below who presents to the clinic for septic bursitis.   She was admitted at [facility] 8/4-8/12 for septic olecranon bursitis of right elbow and abscess of the dorsum of right hand.  She had previous aspiration cultures in June grow Candida albicans and received a 3-week course of fluconazole at that time but no formal debridement.  She was admitted with recurrence of infection and underwent I&D and bursectomy 09/22/20 with orthopedics.  Her cultures again grew Candida (fluconazole susceptible) and she received fluconazole for 4 weeks from her surgery and was to have completed therapy 10/20/20.  She was unfortunately readmitted to [facility] 8/16-8/23 with AKI and creatinine 4.07 likely from [facility] and hypotension.  She was discharged to SNF on 8/23 and left there a couple days ago.  She has been on Diflucan through today and tolerating it well so far.  She saw her cardiologist yesterday and her orthopedist. Jodell Cipro were removed from elbow incision at that visit and seems to have been healing appropriately.  She presents today for follow up..  She reports feeling terrible due to low blood pressure and feeling dehydrated.  She started a new BP medication yesterday (Bidil) and took Lasix for fluid retention.  She sees her PCP later today to go over her recent clinical course.  Her right elbow has been doing okay and currently is not bothering her much.  She is having some shoulder pain.   Please see A&P for the details of today's visit and status of the patient's medical problems.   Patient's Medications  New Prescriptions   No medications on file  Previous Medications   ALBUTEROL (PROVENTIL) (2.5 MG/3ML) 0.083% NEBULIZER SOLUTION    Take 3 mLs (2.5 mg total) by nebulization every 4 (four) hours as needed for wheezing or shortness of breath.   ALLOPURINOL  (ZYLOPRIM) 100 MG TABLET    Take 100 mg by mouth daily.   ATORVASTATIN (LIPITOR) 80 MG TABLET    Take 1 tablet (80 mg total) by mouth daily.   AZELASTINE (ASTELIN) 0.1 % NASAL SPRAY    USE 2 SPRAYS IN EACH NOSTRIL DAILY AS DIRECTED   BISOPROLOL (ZEBETA) 5 MG TABLET    Take 0.5 tablets (2.5 mg total) by mouth daily.   BLOOD GLUCOSE MONITORING SUPPL (BLOOD GLUCOSE SYSTEM PAK) KIT    Please dispense per patient and insurance preference. Use as directed to monitor FSBS 4x . Dx: E11.65   CHOLECALCIFEROL (VITAMIN D) 25 MCG (1000 UT) TABLET    Take 1,000 Units by mouth daily.    CONTINUOUS BLOOD GLUC RECEIVER (FREESTYLE LIBRE 14 DAY READER) DEVI    Apply 1 each topically daily as needed. USE AS DIRECTED TO MONITOR BLOOD GLUCOSE LEVELS. DX: E11.9.   CONTINUOUS BLOOD GLUC SENSOR (FREESTYLE LIBRE 14 DAY SENSOR) MISC    Apply 1 each topically every 14 (fourteen) days. USE AS DIRECTED TO MONITOR BLOOD GLUCOSE LEVELS. DX: E11.9.   CYPROHEPTADINE (PERIACTIN) 4 MG TABLET    TAKE 2 TABLETS BY MOUTH EVERY EVENING   DEXILANT 60 MG CAPSULE    TAKE 1 CAPSULE BY MOUTH EACH MORNING   EPINEPHRINE (EPIPEN 2-PAK) 0.3 MG/0.3 ML IJ SOAJ INJECTION    Inject 0.3 mLs (0.3 mg total) into the muscle Once PRN.   FAMOTIDINE (PEPCID) 20 MG  TABLET    Take 20 mg by mouth at bedtime.   FLUTICASONE (FLONASE) 50 MCG/ACT NASAL SPRAY    USE 2 SPRAYS IN EACH NOSTRIL DAILY   FOLIC ACID (FOLVITE) 1 MG TABLET    Take 1 mg by mouth daily.   FUROSEMIDE (LASIX) 20 MG TABLET    Take 1 tablet (20 mg total) by mouth daily.   GABAPENTIN (NEURONTIN) 600 MG TABLET    TAKE 1 TABLET BY MOUTH DAILY   GLUCOSE BLOOD (BLOOD GLUCOSE TEST STRIPS) STRP    Please dispense per patient and insurance preference. Use as directed to monitor FSBS 4x . Dx: E11.65   GUAIFENESIN (MUCINEX) 600 MG 12 HR TABLET    Take 1 tablet (600 mg total) by mouth 2 (two) times daily as needed for cough or to loosen phlegm.   INSULIN ASPART (NOVOLOG) 100 UNIT/ML INJECTION    Inject 0-9  Units into the skin 3 (three) times daily with meals. Sliding scale insulin Less than 70 initiate hypoglycemia protocol 70-120  0 units 120-150 1 unit 151-200 2 units 201-250 3 units 251-300 5 units 301-350 7 units 351-400 9 units Greater than 400 call MD   ISOSORBIDE-HYDRALAZINE (BIDIL) 20-37.5 MG TABLET    Take 0.5 tablets by mouth 2 (two) times daily.   MAGNESIUM OXIDE (MAG-OX) 400 MG TABLET    Take 400 mg by mouth daily.   MECLIZINE (ANTIVERT) 25 MG TABLET    Take 25 mg by mouth 3 (three) times daily as needed for dizziness.   MIRTAZAPINE (REMERON SOL-TAB) 30 MG DISINTEGRATING TABLET    DISSOLVE 1 TABLET BY MOUTH EVERY NIGHT AT BEDTIME   MITIGARE 0.6 MG CAPS    Take 0.6 mg by mouth every other day.   MONTELUKAST (SINGULAIR) 10 MG TABLET    TAKE 1 TABLET BY MOUTH DAILY   PREDNISONE (DELTASONE) 1 MG TABLET    Take 12 tablets (12 mg total) by mouth daily with breakfast.   PROPYLENE GLYCOL (SYSTANE COMPLETE) 0.6 % SOLN    Place 1 drop into both eyes 2 (two) times daily.   TIOTROPIUM BROMIDE-OLODATEROL (STIOLTO RESPIMAT) 2.5-2.5 MCG/ACT AERS    Inhale 2 puffs into the lungs daily.   XARELTO 20 MG TABS TABLET    TAKE 1 TABLET BY MOUTH DAILY WITH SUPPER  Modified Medications   No medications on file  Discontinued Medications   No medications on file      Past Medical History:  Diagnosis Date   Abscess of dorsum of right hand 09/22/2020   Acute deep vein thrombosis (DVT) of right lower extremity ([place]) 12/13/2017   Allergic rhinitis    Anemia    Angio-edema    Anxiety    pt denies   Arthritis    Phreesia 10/21/2019   Asthma    Phreesia 10/21/2019   Breast cancer ([place]) 1998   in remission   Cataract    REMOVED   Clostridium difficile colitis 01/14/2018   Clostridium difficile diarrhea 16/94/5038   Complication of anesthesia    "had hard time waking up from it several times" (02/20/2012)   Depression    "some; don't take anything for it" (02/20/2012)   Diverticulosis    DVT (deep venous  thrombosis) ([place])    Fibromyalgia 11/2011   GERD (gastroesophageal reflux disease)    Graves disease    Headache(784.0)    "related to allergies; more at different times during the year" (02/20/2012)   Hemorrhoids    Hiatal hernia  back and neck   Hx of adenomatous colonic polyps 04/12/2016   Hypercholesteremia    good cholesterol is high   Hypothyroidism    IBS (irritable bowel syndrome)    Moderate persistent asthma    -FeV1 72% 2011, -IgE 102 2011, CT sinus Neg 2011   Osteoporosis    on reclast yearly   Peripheral vascular disease ([place]) 2019   DVTs   Pneumonia 04/2011; ~ 11/2011   "double; single" (02/20/2012)   Septic olecranon bursitis of right elbow 09/22/2020   Seronegative rheumatoid arthritis ([place])    Dr. Lahoma Rocker   SIRS (systemic inflammatory response syndrome) ([place]) 02/10/2018   Tracheobronchomalacia     Social History   Tobacco Use   Smoking status: Never    Passive exposure: Yes   Smokeless tobacco: Never   Tobacco comments:    Parents  Vaping Use   Vaping Use: Never used  Substance Use Topics   Alcohol use: Not Currently    Alcohol/week: 0.0 standard drinks   Drug use: No    Family History  Problem Relation Age of Onset   Allergies Mother    Heart disease Mother    Arthritis Mother    Lung cancer Mother    Diabetes Mother    Allergies Father    Heart disease Father    Arthritis Father    Stroke Father    Colon cancer Other        Maternal half aunt/Maternal half uncle   Colitis Daughter    Diabetes Maternal Grandfather     Allergies  Allergen Reactions   Baclofen Other (See Comments)    Altered mental status requiring a 3-day hospital stay- patient became unresponsive   Dust Mite Extract Shortness Of Breath and Other (See Comments)    "sneezing" (02/20/2012)   Molds & Smuts Shortness Of Breath   Morphine And Related Hives and Itching   Other Shortness Of Breath and Other (See Comments)    Grass and weeds "sneezing; filled sinuses"  (02/20/2012)   Penicillins Rash and Other (See Comments)    "welts" (02/20/2012) Tolerated Ancef 02/16/20.    Rofecoxib Swelling and Other (See Comments)    Vioxx- feet swelling    Shellfish Allergy Anaphylaxis, Shortness Of Breath, Itching and Rash   Shrimp Flavor Anaphylaxis and Other (See Comments)    ALL SHELLFISH, also   Tetracycline Hcl Nausea And Vomiting   Xolair [Omalizumab] Other (See Comments)    Caused Blood clot   Zoledronic Acid Other (See Comments)    Fever, Put in hospital, dr said it was a reaction from a reaction  Other reaction(s): Unknown   Dilaudid [Hydromorphone Hcl] Itching   Levofloxacin Other (See Comments)    GI upset   Oxycodone Hcl Nausea And Vomiting   Paroxetine Nausea And Vomiting and Other (See Comments)    Paxil    Celecoxib Swelling and Other (See Comments)    Feet swelling   Dilaudid [Hydromorphone] Other (See Comments)    "Allergic," per [facility]   Diltiazem Swelling   Diltiazem Hcl Swelling   Hydrocodone-Acetaminophen Nausea And Vomiting and Other (See Comments)    "Allergic," per [facility]   Lactose Intolerance (Gi) Other (See Comments)    Bloating and gas   Morphine Sulfate Other (See Comments)    "Gets loopy"   Oxycodone-Acetaminophen Other (See Comments)    Reaction??   Strawberry Flavor Other (See Comments)    Reaction?   Tree Extract Other (See Comments)    "tested and  told I was allergic to it; never experienced a reaction to it" (02/20/2012)   Penicillin G Procaine Rash and Other (See Comments)    Welts, also    Review of Systems  Constitutional:  Positive for malaise/fatigue. Negative for fever.  Respiratory:  Positive for shortness of breath.   Cardiovascular:  Positive for leg swelling. Negative for chest pain.  Gastrointestinal: Negative.   Musculoskeletal:  Positive for joint pain.  Skin: Negative.   Neurological:  Positive for dizziness.    OBJECTIVE:    Vitals:   10/27/20 1037  BP: (!) 93/58  Pulse: 86  Temp: 97.8 F  (36.6 C)  TempSrc: Oral  SpO2: 94%   There is no height or weight on file to calculate BMI.  Physical Exam Constitutional:      General: She is not in acute distress.    Comments: She looks tired in appearance, but not in acute distress.   HENT:     Head: Normocephalic and atraumatic.  Cardiovascular:     Rate and Rhythm: Normal rate and regular rhythm.  Pulmonary:     Effort: Pulmonary effort is normal. No respiratory distress.  Musculoskeletal:        General: No tenderness.     Comments: Right elbow incision healed up.  She has good ROM in the joint without warmth or erythema.  Right hand abscess incision healed as well.   Skin:    General: Skin is warm and dry.     Findings: No rash.  Neurological:     General: No focal deficit present.     Mental Status: She is alert and oriented to person, place, and time.  Psychiatric:        Mood and Affect: Mood normal.        Behavior: Behavior normal.     Labs and Microbiology: CBC Latest Ref Rng & Units 10/10/2020 10/09/2020 10/08/2020  WBC 4.0 - 10.5 K/uL 16.0(H) 13.7(H) 14.9(H)  Hemoglobin 12.0 - 15.0 g/dL 9.8(L) 9.8(L) 10.3(L)  Hematocrit 36.0 - 46.0 % 31.9(L) 32.0(L) 34.3(L)  Platelets 150 - 400 K/uL 275 271 285   CMP Latest Ref Rng & Units 10/11/2020 10/10/2020 10/09/2020  Glucose 70 - 99 mg/dL 96 100(H) 105(H)  BUN 8 - 23 mg/dL 40(H) 33(H) 29(H)  Creatinine 0.44 - 1.00 mg/dL 1.43(H) 1.24(H) 1.28(H)  Sodium 135 - 145 mmol/L 142 141 142  Potassium 3.5 - 5.1 mmol/L 3.9 4.0 4.1  Chloride 98 - 111 mmol/L 109 112(H) 113(H)  CO2 22 - 32 mmol/L 23 20(L) 21(L)  Calcium 8.9 - 10.3 mg/dL 8.5(L) 8.5(L) 8.6(L)  Total Protein 6.5 - 8.1 g/dL - - 5.8(L)  Total Bilirubin 0.3 - 1.2 mg/dL - - 0.7  Alkaline Phos 38 - 126 U/L - - 154(H)  AST 15 - 41 U/L - - 35  ALT 0 - 44 U/L - - 50(H)       ASSESSMENT & PLAN:    1. Septic olecranon bursitis of right elbow 2. Candida albicans infection 3. Abscess of dorsum of right hand She  completed 4 weeks of antifungal therapy after I&D and bursectomy.  I am hopeful this eradicated her Candida infection.  We opted for a prolonged course of therapy in the setting of her immunosuppression and she appears to be healing well.  Discussed stopping fluconazole and monitoring at this point which she is okay doing.  Return precautions discussed with her and follow up PRN.  4. Dizziness, low BP Advised to hold on further  doses of BP medications until seen this PM by her PCP.  Discussed whether she would want to go to UC or ED for quicker evaluation and she states she would prefer to go home and get some rest prior to her appointment later today.  Discussed return precautions and importance of follow up with PCP.      Raynelle [facility] for Infectious Disease [facility] 10/27/2020, 11:53 AM  I spent 30 minutes dedicated to the care of this patient on the date of this encounter to include pre-visit review of records, face-to-face time with the patient discussing septic bursitis, hand abscess, dizziness, and post-visit ordering of testing.

## 2020-10-27 NOTE — Patient Instructions (Signed)
Thank you for coming to see me today. It was a pleasure seeing you.  To Do: I think it is okay for you to stop the Diflucan for yeast infection Follow up with Dr Dennard Schaumann regarding blood pressure medication  If you have any questions or concerns, please do not hesitate to call the office at [phone].  Take Care,   Jule Ser

## 2020-10-30 DIAGNOSIS — E1151 Type 2 diabetes mellitus with diabetic peripheral angiopathy without gangrene: Secondary | ICD-10-CM | POA: Diagnosis not present

## 2020-10-30 DIAGNOSIS — J4550 Severe persistent asthma, uncomplicated: Secondary | ICD-10-CM | POA: Diagnosis not present

## 2020-10-30 DIAGNOSIS — F32A Depression, unspecified: Secondary | ICD-10-CM | POA: Diagnosis not present

## 2020-10-30 DIAGNOSIS — E1122 Type 2 diabetes mellitus with diabetic chronic kidney disease: Secondary | ICD-10-CM | POA: Diagnosis not present

## 2020-10-30 DIAGNOSIS — N1832 Chronic kidney disease, stage 3b: Secondary | ICD-10-CM | POA: Diagnosis not present

## 2020-10-30 DIAGNOSIS — M069 Rheumatoid arthritis, unspecified: Secondary | ICD-10-CM | POA: Diagnosis not present

## 2020-10-30 DIAGNOSIS — M71121 Other infective bursitis, right elbow: Secondary | ICD-10-CM | POA: Diagnosis not present

## 2020-10-30 DIAGNOSIS — I5042 Chronic combined systolic (congestive) and diastolic (congestive) heart failure: Secondary | ICD-10-CM | POA: Diagnosis not present

## 2020-10-30 DIAGNOSIS — K219 Gastro-esophageal reflux disease without esophagitis: Secondary | ICD-10-CM | POA: Diagnosis not present

## 2020-10-31 NOTE — Progress Notes (Signed)
Subjective:  Patient ID: Ebony Scott, female    DOB: 1950/08/12,  MRN: [id number]  Chief Complaint  Patient presents with   Routine Post Op    Pos dos 6.3.22    DOS: 07/22/2020 Procedure: Right second digit internal amputation  70 y.o. female returns for post-op check.  Patient states she is doing well.  S she states that everything is healed she has been doing regular dressing changes she denies any other acute complaints.  Review of Systems: Negative except as noted in the HPI. Denies N/V/F/Ch.  Past Medical History:  Diagnosis Date   Abscess of dorsum of right hand 09/22/2020   Acute deep vein thrombosis (DVT) of right lower extremity ([place]) 12/13/2017   Allergic rhinitis    Anemia    Angio-edema    Anxiety    pt denies   Arthritis    Phreesia 10/21/2019   Asthma    Phreesia 10/21/2019   Breast cancer ([place]) 1998   in remission   Cataract    REMOVED   Clostridium difficile colitis 01/14/2018   Clostridium difficile diarrhea 90/24/0973   Complication of anesthesia    "had hard time waking up from it several times" (02/20/2012)   Depression    "some; don't take anything for it" (02/20/2012)   Diverticulosis    DVT (deep venous thrombosis) ([place])    Fibromyalgia 11/2011   GERD (gastroesophageal reflux disease)    Graves disease    Headache(784.0)    "related to allergies; more at different times during the year" (02/20/2012)   Hemorrhoids    Hiatal hernia    back and neck   Hx of adenomatous colonic polyps 04/12/2016   Hypercholesteremia    good cholesterol is high   Hypothyroidism    IBS (irritable bowel syndrome)    Moderate persistent asthma    -FeV1 72% 2011, -IgE 102 2011, CT sinus Neg 2011   Osteoporosis    on reclast yearly   Peripheral vascular disease ([place]) 2019   DVTs   Pneumonia 04/2011; ~ 11/2011   "double; single" (02/20/2012)   Septic olecranon bursitis of right elbow 09/22/2020   Seronegative rheumatoid arthritis ([place])    Dr. Lahoma Rocker   SIRS  (systemic inflammatory response syndrome) ([place]) 02/10/2018   Tracheobronchomalacia     Current Outpatient Medications:    albuterol (PROVENTIL) (2.5 MG/3ML) 0.083% nebulizer solution, Take 3 mLs (2.5 mg total) by nebulization every 4 (four) hours as needed for wheezing or shortness of breath., Disp: 75 mL, Rfl: 12   allopurinol (ZYLOPRIM) 100 MG tablet, Take 100 mg by mouth daily., Disp: , Rfl:    atorvastatin (LIPITOR) 80 MG tablet, Take 1 tablet (80 mg total) by mouth daily., Disp: , Rfl:    azelastine (ASTELIN) 0.1 % nasal spray, USE 2 SPRAYS IN EACH NOSTRIL DAILY AS DIRECTED, Disp: 30 mL, Rfl: 5   bisoprolol (ZEBETA) 5 MG tablet, Take 0.5 tablets (2.5 mg total) by mouth daily., Disp: , Rfl:    Blood Glucose Monitoring Suppl (BLOOD GLUCOSE SYSTEM PAK) KIT, Please dispense per patient and insurance preference. Use as directed to monitor FSBS 4x . Dx: E11.65, Disp: 1 kit, Rfl: 0   cholecalciferol (VITAMIN D) 25 MCG (1000 UT) tablet, Take 1,000 Units by mouth daily. , Disp: , Rfl:    Continuous Blood Gluc Receiver (FREESTYLE LIBRE 14 DAY READER) DEVI, Apply 1 each topically daily as needed. USE AS DIRECTED TO MONITOR BLOOD GLUCOSE LEVELS. DX: E11.9., Disp: 1 each, Rfl: 1  Continuous Blood Gluc Sensor (FREESTYLE LIBRE 14 DAY SENSOR) MISC, Apply 1 each topically every 14 (fourteen) days. USE AS DIRECTED TO MONITOR BLOOD GLUCOSE LEVELS. DX: E11.9., Disp: 2 each, Rfl: 11   cyproheptadine (PERIACTIN) 4 MG tablet, TAKE 2 TABLETS BY MOUTH EVERY EVENING, Disp: 180 tablet, Rfl: 2   DEXILANT 60 MG capsule, TAKE 1 CAPSULE BY MOUTH EACH MORNING, Disp: 90 capsule, Rfl: 3   EPINEPHrine (EPIPEN 2-PAK) 0.3 mg/0.3 mL IJ SOAJ injection, Inject 0.3 mLs (0.3 mg total) into the muscle Once PRN., Disp: 0.3 mL, Rfl: 2   famotidine (PEPCID) 20 MG tablet, Take 20 mg by mouth at bedtime., Disp: , Rfl:    fluticasone (FLONASE) 50 MCG/ACT nasal spray, USE 2 SPRAYS IN EACH NOSTRIL DAILY, Disp: 16 g, Rfl: 2   folic acid  (FOLVITE) 1 MG tablet, Take 1 mg by mouth daily., Disp: , Rfl:    furosemide (LASIX) 20 MG tablet, Take 1 tablet (20 mg total) by mouth daily. (Patient taking differently: Take 60 mg by mouth daily. 2 tabs am, 1 tab pm), Disp: 30 tablet, Rfl:    gabapentin (NEURONTIN) 600 MG tablet, TAKE 1 TABLET BY MOUTH DAILY, Disp: 90 tablet, Rfl: 3   Glucose Blood (BLOOD GLUCOSE TEST STRIPS) STRP, Please dispense per patient and insurance preference. Use as directed to monitor FSBS 4x . Dx: E11.65, Disp: 150 strip, Rfl: 1   guaiFENesin (MUCINEX) 600 MG 12 hr tablet, Take 1 tablet (600 mg total) by mouth 2 (two) times daily as needed for cough or to loosen phlegm., Disp: 60 tablet, Rfl: 1   insulin aspart (NOVOLOG) 100 UNIT/ML injection, Inject 0-9 Units into the skin 3 (three) times daily with meals. Sliding scale insulin Less than 70 initiate hypoglycemia protocol 70-120  0 units 120-150 1 unit 151-200 2 units 201-250 3 units 251-300 5 units 301-350 7 units 351-400 9 units Greater than 400 call MD, Disp: 10 mL, Rfl: 11   isosorbide-hydrALAZINE (BIDIL) 20-37.5 MG tablet, Take 0.5 tablets by mouth 2 (two) times daily., Disp: , Rfl:    magnesium oxide (MAG-OX) 400 MG tablet, Take 400 mg by mouth daily., Disp: , Rfl:    meclizine (ANTIVERT) 25 MG tablet, Take 25 mg by mouth 3 (three) times daily as needed for dizziness., Disp: , Rfl:    mirtazapine (REMERON SOL-TAB) 30 MG disintegrating tablet, DISSOLVE 1 TABLET BY MOUTH EVERY NIGHT AT BEDTIME, Disp: 90 tablet, Rfl: 1   MITIGARE 0.6 MG CAPS, Take 0.6 mg by mouth every other day., Disp: , Rfl:    montelukast (SINGULAIR) 10 MG tablet, TAKE 1 TABLET BY MOUTH DAILY, Disp: 30 tablet, Rfl: 5   predniSONE (DELTASONE) 1 MG tablet, Take 12 tablets (12 mg total) by mouth daily with breakfast., Disp: , Rfl:    Propylene Glycol (SYSTANE COMPLETE) 0.6 % SOLN, Place 1 drop into both eyes 2 (two) times daily., Disp: , Rfl:    Tiotropium Bromide-Olodaterol (STIOLTO RESPIMAT) 2.5-2.5  MCG/ACT AERS, Inhale 2 puffs into the lungs daily., Disp: 4 g, Rfl: 5   XARELTO 20 MG TABS tablet, TAKE 1 TABLET BY MOUTH DAILY WITH SUPPER, Disp: 90 tablet, Rfl: 3  Current Facility-Administered Medications:    denosumab (PROLIA) injection 60 mg, 60 mg, Subcutaneous, Q6 months, [name], Warren T, MD, 60 mg at 03/31/20 1224  Social History   Tobacco Use  Smoking Status Never   Passive exposure: Yes  Smokeless Tobacco Never  Tobacco Comments   Parents    Allergies  Allergen  Reactions   Baclofen Other (See Comments)    Altered mental status requiring a 3-day hospital stay- patient became unresponsive   Dust Mite Extract Shortness Of Breath and Other (See Comments)    "sneezing" (02/20/2012)   Molds & Smuts Shortness Of Breath   Morphine And Related Hives and Itching   Other Shortness Of Breath and Other (See Comments)    Grass and weeds "sneezing; filled sinuses" (02/20/2012)   Penicillins Rash and Other (See Comments)    "welts" (02/20/2012) Tolerated Ancef 02/16/20.    Rofecoxib Swelling and Other (See Comments)    Vioxx- feet swelling    Shellfish Allergy Anaphylaxis, Shortness Of Breath, Itching and Rash   Shrimp Flavor Anaphylaxis and Other (See Comments)    ALL SHELLFISH, also   Tetracycline Hcl Nausea And Vomiting   Xolair [Omalizumab] Other (See Comments)    Caused Blood clot   Zoledronic Acid Other (See Comments)    Fever, Put in hospital, dr said it was a reaction from a reaction  Other reaction(s): Unknown   Dilaudid [Hydromorphone Hcl] Itching   Levofloxacin Other (See Comments)    GI upset   Oxycodone Hcl Nausea And Vomiting   Paroxetine Nausea And Vomiting and Other (See Comments)    Paxil    Celecoxib Swelling and Other (See Comments)    Feet swelling   Dilaudid [Hydromorphone] Other (See Comments)    "Allergic," per [facility]   Diltiazem Swelling   Diltiazem Hcl Swelling   Hydrocodone-Acetaminophen Nausea And Vomiting and Other (See Comments)    "Allergic,"  per [facility]   Lactose Intolerance (Gi) Other (See Comments)    Bloating and gas   Morphine Sulfate Other (See Comments)    "Gets loopy"   Oxycodone-Acetaminophen Other (See Comments)    Reaction??   Strawberry Flavor Other (See Comments)    Reaction?   Tree Extract Other (See Comments)    "tested and told I was allergic to it; never experienced a reaction to it" (02/20/2012)   Penicillin G Procaine Rash and Other (See Comments)    Welts, also   Objective:  There were no vitals filed for this visit. There is no height or weight on file to calculate BMI. Constitutional Well developed. Well nourished.  Vascular Foot warm and well perfused. Capillary refill normal to all digits.   Neurologic Normal speech. Oriented to person, place, and time. Epicritic sensation to light touch grossly present bilaterally.  Dermatologic Skin completely epithelialized.  No concern for ulceration noted no clinical signs of infection noted.  Orthopedic: Nail tenderness to palpation noted about the surgical site.   Radiographs: 3 views of skeletally mature adult right foot: Arthroplasty noted at the head of the second metatarsal phalangeal joint. Assessment:   1. Acute osteomyelitis of toe, right ([place])   2. Toe ulcer, right, with fat layer exposed ([place])       Plan:  Patient was evaluated and treated and all questions answered.  S/p foot surgery right -Clinically healed  Right second digit ulceration with fat layer expose -Ulceration has completely epithelialized.  No further ulceration noted.  At this time I discussed shoe gear modification and prevention technique.  Patient states understanding.  If any foot and ankle issues arises or if a real ulceration happens come see me right away.  She agrees  No follow-ups on file.      No follow-ups on file.

## 2020-11-01 ENCOUNTER — Encounter (HOSPITAL_BASED_OUTPATIENT_CLINIC_OR_DEPARTMENT_OTHER): Payer: Self-pay | Admitting: Cardiology

## 2020-11-01 ENCOUNTER — Other Ambulatory Visit: Payer: Self-pay

## 2020-11-01 ENCOUNTER — Telehealth: Payer: Self-pay | Admitting: *Deleted

## 2020-11-01 ENCOUNTER — Ambulatory Visit (INDEPENDENT_AMBULATORY_CARE_PROVIDER_SITE_OTHER): Payer: Medicare PPO | Admitting: Allergy and Immunology

## 2020-11-01 VITALS — BP 118/66 | HR 78 | Temp 98.0°F | Resp 18 | Ht 61.0 in | Wt 126.0 lb

## 2020-11-01 DIAGNOSIS — B379 Candidiasis, unspecified: Secondary | ICD-10-CM | POA: Diagnosis not present

## 2020-11-01 DIAGNOSIS — M069 Rheumatoid arthritis, unspecified: Secondary | ICD-10-CM | POA: Diagnosis not present

## 2020-11-01 DIAGNOSIS — K219 Gastro-esophageal reflux disease without esophagitis: Secondary | ICD-10-CM | POA: Diagnosis not present

## 2020-11-01 DIAGNOSIS — T380X5A Adverse effect of glucocorticoids and synthetic analogues, initial encounter: Secondary | ICD-10-CM

## 2020-11-01 DIAGNOSIS — G500 Trigeminal neuralgia: Secondary | ICD-10-CM | POA: Diagnosis not present

## 2020-11-01 DIAGNOSIS — N1832 Chronic kidney disease, stage 3b: Secondary | ICD-10-CM | POA: Diagnosis not present

## 2020-11-01 DIAGNOSIS — J3089 Other allergic rhinitis: Secondary | ICD-10-CM

## 2020-11-01 DIAGNOSIS — J4550 Severe persistent asthma, uncomplicated: Secondary | ICD-10-CM

## 2020-11-01 DIAGNOSIS — M109 Gout, unspecified: Secondary | ICD-10-CM | POA: Diagnosis not present

## 2020-11-01 DIAGNOSIS — F32A Depression, unspecified: Secondary | ICD-10-CM | POA: Diagnosis not present

## 2020-11-01 DIAGNOSIS — I509 Heart failure, unspecified: Secondary | ICD-10-CM | POA: Diagnosis not present

## 2020-11-01 DIAGNOSIS — I5042 Chronic combined systolic (congestive) and diastolic (congestive) heart failure: Secondary | ICD-10-CM | POA: Diagnosis not present

## 2020-11-01 DIAGNOSIS — D84821 Immunodeficiency due to drugs: Secondary | ICD-10-CM | POA: Diagnosis not present

## 2020-11-01 DIAGNOSIS — E1151 Type 2 diabetes mellitus with diabetic peripheral angiopathy without gangrene: Secondary | ICD-10-CM | POA: Diagnosis not present

## 2020-11-01 DIAGNOSIS — E1122 Type 2 diabetes mellitus with diabetic chronic kidney disease: Secondary | ICD-10-CM | POA: Diagnosis not present

## 2020-11-01 DIAGNOSIS — D631 Anemia in chronic kidney disease: Secondary | ICD-10-CM | POA: Diagnosis not present

## 2020-11-01 DIAGNOSIS — B999 Unspecified infectious disease: Secondary | ICD-10-CM

## 2020-11-01 DIAGNOSIS — T451X5A Adverse effect of antineoplastic and immunosuppressive drugs, initial encounter: Secondary | ICD-10-CM

## 2020-11-01 DIAGNOSIS — Z7952 Long term (current) use of systemic steroids: Secondary | ICD-10-CM | POA: Diagnosis not present

## 2020-11-01 DIAGNOSIS — M71121 Other infective bursitis, right elbow: Secondary | ICD-10-CM | POA: Diagnosis not present

## 2020-11-01 MED ORDER — MONTELUKAST SODIUM 10 MG PO TABS: 10.0000 mg | ORAL_TABLET | Freq: Every day | ORAL | 5 refills | Status: DC

## 2020-11-01 MED ORDER — AZELASTINE HCL 0.1 % NA SOLN: NASAL | 5 refills | Status: DC

## 2020-11-01 MED ORDER — CETIRIZINE HCL 10 MG PO TABS: 10.0000 mg | ORAL_TABLET | Freq: Every day | ORAL | 5 refills | Status: DC

## 2020-11-01 NOTE — Patient Instructions (Addendum)
  1. 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 Periactin 4 mg tablet  2 tablets at bedtime  2. Continue to Treat laryngopharyngeal reflux:    A. Dexilant 60 mg in the morning  B. famotidine 40 mg daily  4.  Use Astelin and Zyrtec and nasal saline if needed  5.  Continue therapy directed by pulmonology and rheumatology  6.  Obtain fall flu vaccine  7.  Engage in progressive exercise program  8. Blood - IgA/G/M, anti-pneumococcal antibodies, semiquantitative anti-COVID IgG antibodies, antitetanus antibody  9.  Return to clinic in 6 months or earlier if problem

## 2020-11-01 NOTE — Progress Notes (Signed)
[facility] [place] [facility] [place]   Follow-up Note  Referring Provider: [name], MD Primary Provider: [name], MD Date of Office Visit: 11/01/2020  Subjective:   Ebony Scott (DOB: [birth year]) is a 70 y.o. female who returns to the [facility] on 11/01/2020 in re-evaluation of the following:  HPI: Ebony Scott returns to this clinic in evaluation of allergic rhinoconjunctivitis, facial pain syndrome, sleep dysfunction, reflux, and a history of asthma, tracheobronchial malacia, bronchiectasis, and rheumatoid arthritis, and systemic steroid use.  Her last visit to this clinic was 26 April 2020.  Regarding the issues that we see her for in this clinic, she has actually done relatively well.  She has very little problem with her upper airway at this point in time.  Her sleep dysfunction and facial pain syndrome is under very good control.  She believes that her reflux is also under good control and her swallowing issue is not a particularly big problem at this point.  She continues to use a collection of therapy directed against all of these issues which includes Periactin and Dexilant and famotidine and Astelin and Zyrtec.  Her lung issue is being followed by pulmonary.  Currently she is on systemic steroids at 12.5 mg/day.  She has been treated with rituximab for rheumatoid arthritis in the past.  She has had a very eventful interval of time since I last seen her in this clinic.  It sounds as though she was hospitalized for a muscle relaxer side effect that was given to her after her right shoulder surgery that required hospitalization. Apparently she had significant CNS side effects.  Then, she apparently had a fungal infection involving her hands and elbow that required hospitalization and subsequently rehab at a nursing facility for the past month.  During her hospitalization it was documented that her ejection fraction was 35% and she was  started on various medications for congestive heart failure.  At this point she is extremely weak.  She is having PT come out to the house for her first rehabilitation session this week.  She has received 4 COVID vaccinations.  Allergies as of 11/01/2020       Reactions   Baclofen Other (See Comments)   Altered mental status requiring a 3-day hospital stay- patient became unresponsive   Dust Mite Extract Shortness Of Breath, Other (See Comments)   "sneezing" (02/20/2012)   Molds & Smuts Shortness Of Breath   Morphine And Related Hives, Itching   Other Shortness Of Breath, Other (See Comments)   Grass and weeds "sneezing; filled sinuses" (02/20/2012)   Penicillins Rash, Other (See Comments)   "welts" (02/20/2012) Tolerated Ancef 02/16/20.   Rofecoxib Swelling, Other (See Comments)   Vioxx- feet swelling   Shellfish Allergy Anaphylaxis, Shortness Of Breath, Itching, Rash   Shrimp Flavor Anaphylaxis, Other (See Comments)   ALL SHELLFISH, also   Tetracycline Hcl Nausea And Vomiting   Xolair [omalizumab] Other (See Comments)   Caused Blood clot   Zoledronic Acid Other (See Comments)   Fever, Put in hospital, dr said it was a reaction from a reaction  Other reaction(s): Unknown   Dilaudid [hydromorphone Hcl] Itching   Levofloxacin Other (See Comments)   GI upset   Oxycodone Hcl Nausea And Vomiting   Paroxetine Nausea And Vomiting, Other (See Comments)   Paxil   Celecoxib Swelling, Other (See Comments)   Feet swelling   Dilaudid [hydromorphone] Other (See Comments)   "Allergic," per  MAR   Diltiazem Swelling   Diltiazem Hcl Swelling   Hydrocodone-acetaminophen Nausea And Vomiting, Other (See Comments)   "Allergic," per MAR   Lactose Intolerance (gi) Other (See Comments)   Bloating and gas   Morphine Sulfate Other (See Comments)   "Gets loopy"   Oxycodone-acetaminophen Other (See Comments)   Reaction??   Strawberry Flavor Other (See Comments)   Reaction?   Tree Extract Other (See  Comments)   "tested and told I was allergic to it; never experienced a reaction to it" (02/20/2012)   Penicillin G Procaine Rash, Other (See Comments)   Welts, also        Medication List    albuterol (2.5 MG/3ML) 0.083% nebulizer solution Commonly known as: PROVENTIL Take 3 mLs (2.5 mg total) by nebulization every 4 (four) hours as needed for wheezing or shortness of breath.   allopurinol 100 MG tablet Commonly known as: ZYLOPRIM Take 100 mg by mouth daily.   atorvastatin 80 MG tablet Commonly known as: LIPITOR Take 1 tablet (80 mg total) by mouth daily.   azelastine 0.1 % nasal spray Commonly known as: ASTELIN USE 2 SPRAYS IN EACH NOSTRIL DAILY AS DIRECTED   bisoprolol 5 MG tablet Commonly known as: ZEBETA Take 0.5 tablets (2.5 mg total) by mouth daily.   Blood Glucose System Pak Kit Please dispense per patient and insurance preference. Use as directed to monitor FSBS 4x . Dx: E11.65   BLOOD GLUCOSE TEST STRIPS Strp Please dispense per patient and insurance preference. Use as directed to monitor FSBS 4x . Dx: E11.65   cholecalciferol 25 MCG (1000 UNIT) tablet Commonly known as: VITAMIN D Take 1,000 Units by mouth daily.   cyproheptadine 4 MG tablet Commonly known as: PERIACTIN TAKE 2 TABLETS BY MOUTH EVERY EVENING   Dexilant 60 MG capsule Generic drug: dexlansoprazole TAKE 1 CAPSULE BY MOUTH EACH MORNING   EPINEPHrine 0.3 mg/0.3 mL Soaj injection Commonly known as: EpiPen 2-Pak Inject 0.3 mLs (0.3 mg total) into the muscle Once PRN.   famotidine 20 MG tablet Commonly known as: PEPCID Take 20 mg by mouth at bedtime.   fluticasone 50 MCG/ACT nasal spray Commonly known as: FLONASE USE 2 SPRAYS IN EACH NOSTRIL DAILY   folic acid 1 MG tablet Commonly known as: FOLVITE Take 1 mg by mouth daily.   FreeStyle Libre 14 Day Reader Kerrin Mo Apply 1 each topically daily as needed. USE AS DIRECTED TO MONITOR BLOOD GLUCOSE LEVELS. DX: E11.9.   FreeStyle Libre 14 Day  Sensor Misc Apply 1 each topically every 14 (fourteen) days. USE AS DIRECTED TO MONITOR BLOOD GLUCOSE LEVELS. DX: E11.9.   furosemide 20 MG tablet Commonly known as: LASIX Take 1 tablet (20 mg total) by mouth daily.   gabapentin 600 MG tablet Commonly known as: NEURONTIN TAKE 1 TABLET BY MOUTH DAILY   guaiFENesin 600 MG 12 hr tablet Commonly known as: MUCINEX Take 1 tablet (600 mg total) by mouth 2 (two) times daily as needed for cough or to loosen phlegm.   insulin aspart 100 UNIT/ML injection Commonly known as: novoLOG Inject 0-9 Units into the skin 3 (three) times daily with meals. Sliding scale insulin Less than 70 initiate hypoglycemia protocol 70-120  0 units 120-150 1 unit 151-200 2 units 201-250 3 units 251-300 5 units 301-350 7 units 351-400 9 units Greater than 400 call MD   isosorbide-hydrALAZINE 20-37.5 MG tablet Commonly known as: BIDIL Take 0.5 tablets by mouth 2 (two) times daily.   magnesium oxide 400 MG  tablet Commonly known as: MAG-OX Take 400 mg by mouth daily.   meclizine 25 MG tablet Commonly known as: ANTIVERT Take 25 mg by mouth 3 (three) times daily as needed for dizziness.   mirtazapine 30 MG disintegrating tablet Commonly known as: REMERON SOL-TAB DISSOLVE 1 TABLET BY MOUTH EVERY NIGHT AT BEDTIME   Mitigare 0.6 MG Caps Generic drug: Colchicine Take 0.6 mg by mouth every other day.   montelukast 10 MG tablet Commonly known as: SINGULAIR TAKE 1 TABLET BY MOUTH DAILY   predniSONE 1 MG tablet Commonly known as: DELTASONE Take 12 tablets (12 mg total) by mouth daily with breakfast.   Stiolto Respimat 2.5-2.5 MCG/ACT Aers Generic drug: Tiotropium Bromide-Olodaterol Inhale 2 puffs into the lungs daily.   Systane Complete 0.6 % Soln Generic drug: Propylene Glycol Place 1 drop into both eyes 2 (two) times daily.   Xarelto 20 MG Tabs tablet Generic drug: rivaroxaban TAKE 1 TABLET BY MOUTH DAILY WITH SUPPER    Past Medical History:   Diagnosis Date   Abscess of dorsum of right hand 09/22/2020   Acute deep vein thrombosis (DVT) of right lower extremity (HCC) 12/13/2017   Allergic rhinitis    Anemia    Angio-edema    Anxiety    pt denies   Arthritis    Phreesia 10/21/2019   Asthma    [facility]reesia 10/21/2019   Breast cancer (HCC) 1998   in remission   Cataract    REMOVED   Clostridium difficile colitis 01/14/2018   Clostridium difficile diarrhea 01/14/2018   Complication of anesthesia    "had hard time waking up from it several times" (02/20/2012)   Depression    "some; don't take anything for it" (02/20/2012)   Diverticulosis    DVT (deep venous thrombosis) (HCC)    Fibromyalgia 11/2011   GERD (gastroesophageal reflux disease)    Graves disease    Headache(784.0)    "related to allergies; more at different times during the year" (02/20/2012)   Hemorrhoids    Hiatal hernia    back and neck   Hx of adenomatous colonic polyps 04/12/2016   Hypercholesteremia    good cholesterol is high   Hypothyroidism    IBS (irritable bowel syndrome)    Moderate persistent asthma    -FeV1 72% 2011, -IgE 102 2011, CT sinus Neg 2011   Osteoporosis    on reclast yearly   Peripheral vascular disease (HCC) 2019   DVTs   Pneumonia 04/2011; ~ 11/2011   "double; single" (02/20/2012)   Septic olecranon bursitis of right elbow 09/22/2020   Seronegative rheumatoid arthritis (HCC)    Dr. Casimer Lanius   SIRS (systemic inflammatory response syndrome) (HCC) 02/10/2018   Tracheobronchomalacia     Past Surgical History:  Procedure Laterality Date   ABDOMINAL HYSTERECTOMY N/A    Phreesia 10/21/2019   ANTERIOR AND POSTERIOR REPAIR  1990's   APPENDECTOMY     BREAST LUMPECTOMY  1998   left   BREAST SURGERY N/A    Phreesia 10/21/2019   BRONCHIAL WASHINGS  04/05/2020   Procedure: BRONCHIAL WASHINGS;  Surgeon: Martina Sinner, MD;  Location: [facility];  Service: Pulmonary;;   CARPOMETACARPEL (CMC) FUSION OF THUMB WITH AUTOGRAFT  FROM RADIUS  ~ 2009   "both thumbs" (02/20/2012)   CATARACT EXTRACTION W/ INTRAOCULAR LENS  IMPLANT, BILATERAL  2012   CERVICAL DISCECTOMY  10/2001   C5-C6   CERVICAL FUSION  2003   C3-C4   CHOLECYSTECTOMY     COLONOSCOPY  DEBRIDEMENT TENNIS ELBOW  ?1970's   right   ESOPHAGOGASTRODUODENOSCOPY     EYE SURGERY N/A    [facility]reesia 10/21/2019   HYSTERECTOMY     I & D EXTREMITY Right 09/22/2020   Procedure: IRRIGATION AND DEBRIDEMENT RIGHT HAND AND ELBOW;  Surgeon: Teryl Lucy, MD;  Location: [facility];  Service: Orthopedics;  Laterality: Right;   KNEE ARTHROPLASTY  ?1990's   "?right; w/cartilage repair" (02/20/2012)   NASAL SEPTUM SURGERY  1980's   POSTERIOR CERVICAL FUSION/FORAMINOTOMY  2004   "failed initial fusion; rewired  anterior neck" (02/20/2012)   REVERSE SHOULDER ARTHROPLASTY Right 02/16/2020   Procedure: REVERSE SHOULDER ARTHROPLASTY;  Surgeon: Teryl Lucy, MD;  Location: [facility];  Service: Orthopedics;  Laterality: Right;   SPINE SURGERY N/A    Phreesia 10/21/2019   TONSILLECTOMY  ~ 1953   VESICOVAGINAL FISTULA CLOSURE W/ TAH  1988   VIDEO BRONCHOSCOPY Bilateral 08/23/2016   Procedure: VIDEO BRONCHOSCOPY WITH FLUORO;  Surgeon: Roslynn Amble, MD;  Location: [facility];  Service: Cardiopulmonary;  Laterality: Bilateral;   VIDEO BRONCHOSCOPY N/A 04/05/2020   Procedure: VIDEO BRONCHOSCOPY WITHOUT FLUORO;  Surgeon: Martina Sinner, MD;  Location: [facility];  Service: Pulmonary;  Laterality: N/A;    Review of systems negative except as noted in HPI / PMHx or noted below:  Review of Systems  Constitutional: Negative.   HENT: Negative.    Eyes: Negative.   Respiratory: Negative.    Cardiovascular: Negative.   Gastrointestinal: Negative.   Genitourinary: Negative.   Musculoskeletal: Negative.   Skin: Negative.   Neurological: Negative.   Endo/Heme/Allergies: Negative.   Psychiatric/Behavioral: Negative.      Objective:   Vitals:   11/01/20 1024  BP: 118/66   Pulse: 78  Resp: 18  Temp: 98 F (36.7 C)  SpO2: 96%   Height: 5\' 1"  (154.9 cm)  Weight: 126 lb (57.2 kg)   Physical Exam Constitutional:      Appearance: She is not diaphoretic.     Comments: Cushingoid appearance  HENT:     Head: Normocephalic.     Right Ear: Tympanic membrane, ear canal and external ear normal.     Left Ear: Tympanic membrane, ear canal and external ear normal.     Nose: Nose normal. No mucosal edema or rhinorrhea.     Mouth/Throat:     Pharynx: Uvula midline. No oropharyngeal exudate.  Eyes:     Conjunctiva/sclera: Conjunctivae normal.  Neck:     Thyroid: No thyromegaly.     Trachea: Trachea normal. No tracheal tenderness or tracheal deviation.  Cardiovascular:     Rate and Rhythm: Normal rate and regular rhythm.     Heart sounds: Normal heart sounds, S1 normal and S2 normal. No murmur heard. Pulmonary:     Effort: No respiratory distress.     Breath sounds: Normal breath sounds. No stridor. No wheezing or rales.  Lymphadenopathy:     Head:     Right side of head: No tonsillar adenopathy.     Left side of head: No tonsillar adenopathy.     Cervical: No cervical adenopathy.  Skin:    Findings: No erythema or rash.     Nails: There is no clubbing.  Neurological:     Mental Status: She is alert.    Diagnostics:    Spirometry was performed and demonstrated an FEV1 of 1.15 at 60 % of predicted.  Assessment and Plan:   1. Facial pain syndrome   2. Other allergic rhinitis  3. Candida albicans infection   4. Immunosuppression due to chronic steroid use ([place])   5. Adverse effect of rituximab infusion   6. Asthma, severe persistent, well-controlled     1. 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 Periactin 4 mg tablet  2 tablets at bedtime  2. Continue to Treat laryngopharyngeal reflux:    A. Dexilant 60 mg in the morning  B. famotidine 40 mg daily  4.  Use Astelin and Zyrtec and nasal saline if needed  5.  Continue  therapy directed by pulmonology and rheumatology  6.  Obtain fall flu vaccine  7.  Engage in progressive exercise program  8. Blood - IgA/G/M, anti-pneumococcal antibodies, semiquantitative anti-COVID IgG antibodies, antitetanus antibody  9.  Return to clinic in 6 months or earlier if problem   Haylen appears to have her facial pain syndrome, sleep dysfunction, LPR, and upper airway disease under good control on her current therapy.  She has had a very difficult time with her lungs and her joints and what appears to be some other new developments including Candida septic arthritis and a degree of congestive heart failure.  Because she has been treated with rituximab in the past I think we are obligated to rule out a significant B-cell dysfunction contributing to some of these issues and will check the blood test noted above.  She has had 4 COVID vaccinations and she should have high titer anti-COVID IgG antibodies if she did respond to these immunization agents.  I will contact her with the results of her blood test once they are available for review.   Allena Katz, MD Allergy / Immunology [place]

## 2020-11-01 NOTE — Telephone Encounter (Signed)
Received call from [place], [facility] OT with [place] [phone]~ telephone.   Requested to extend [facility] OT services 1x weekly x8 weeks for upper body strengthening, showering, strengthening, iADL's and fall prevention.   VO given.   [facility] OT also requested order for bedside commode. MyChart message sent to patient to inquire about DME Suppliers.

## 2020-11-02 ENCOUNTER — Other Ambulatory Visit: Payer: Self-pay | Admitting: *Deleted

## 2020-11-02 ENCOUNTER — Encounter: Payer: Self-pay | Admitting: Allergy and Immunology

## 2020-11-02 ENCOUNTER — Ambulatory Visit: Payer: Medicare PPO | Admitting: Internal Medicine

## 2020-11-02 DIAGNOSIS — I5042 Chronic combined systolic (congestive) and diastolic (congestive) heart failure: Secondary | ICD-10-CM | POA: Diagnosis not present

## 2020-11-02 DIAGNOSIS — K219 Gastro-esophageal reflux disease without esophagitis: Secondary | ICD-10-CM | POA: Diagnosis not present

## 2020-11-02 DIAGNOSIS — F32A Depression, unspecified: Secondary | ICD-10-CM | POA: Diagnosis not present

## 2020-11-02 DIAGNOSIS — E1151 Type 2 diabetes mellitus with diabetic peripheral angiopathy without gangrene: Secondary | ICD-10-CM | POA: Diagnosis not present

## 2020-11-02 DIAGNOSIS — N1832 Chronic kidney disease, stage 3b: Secondary | ICD-10-CM | POA: Diagnosis not present

## 2020-11-02 DIAGNOSIS — J4550 Severe persistent asthma, uncomplicated: Secondary | ICD-10-CM | POA: Diagnosis not present

## 2020-11-02 DIAGNOSIS — M069 Rheumatoid arthritis, unspecified: Secondary | ICD-10-CM | POA: Diagnosis not present

## 2020-11-02 DIAGNOSIS — M71121 Other infective bursitis, right elbow: Secondary | ICD-10-CM | POA: Diagnosis not present

## 2020-11-02 DIAGNOSIS — E1122 Type 2 diabetes mellitus with diabetic chronic kidney disease: Secondary | ICD-10-CM | POA: Diagnosis not present

## 2020-11-03 ENCOUNTER — Other Ambulatory Visit: Payer: Self-pay

## 2020-11-03 ENCOUNTER — Ambulatory Visit (INDEPENDENT_AMBULATORY_CARE_PROVIDER_SITE_OTHER): Payer: Medicare PPO | Admitting: Family Medicine

## 2020-11-03 ENCOUNTER — Other Ambulatory Visit: Payer: Self-pay | Admitting: Family Medicine

## 2020-11-03 VITALS — BP 128/60 | HR 94 | Temp 99.1°F | Resp 16 | Ht 61.0 in | Wt 126.0 lb

## 2020-11-03 DIAGNOSIS — J4550 Severe persistent asthma, uncomplicated: Secondary | ICD-10-CM

## 2020-11-03 DIAGNOSIS — E1169 Type 2 diabetes mellitus with other specified complication: Secondary | ICD-10-CM | POA: Diagnosis not present

## 2020-11-03 DIAGNOSIS — N1832 Chronic kidney disease, stage 3b: Secondary | ICD-10-CM

## 2020-11-03 DIAGNOSIS — Z23 Encounter for immunization: Secondary | ICD-10-CM | POA: Diagnosis not present

## 2020-11-03 DIAGNOSIS — I5042 Chronic combined systolic (congestive) and diastolic (congestive) heart failure: Secondary | ICD-10-CM | POA: Diagnosis not present

## 2020-11-03 MED ORDER — GLIPIZIDE ER 5 MG PO TB24: 5.0000 mg | ORAL_TABLET | Freq: Every day | ORAL | 3 refills | Status: DC

## 2020-11-03 MED ORDER — BENZONATATE 200 MG PO CAPS: 200.0000 mg | ORAL_CAPSULE | Freq: Three times a day (TID) | ORAL | 0 refills | Status: DC | PRN

## 2020-11-03 NOTE — Progress Notes (Signed)
Subjective:    Patient ID: Ebony Scott, female    DOB: October 28, 1950, 70 y.o.   MRN: [id number]  HPI Admit date: 10/04/2020 Discharge date: 10/11/2020    Recommendations for Outpatient Follow-up:  Follow-up cardiology in 1 week Follow-up orthopedics, Dr. Mardelle Matte as outpatient1 week Continue daily dressing changes, ok to use small mepilexes. Patient will follow up 1 week with orthopedics  Obtain BMP in 1 week   Discharge Diagnoses:  Principal Problem:   ARF (acute renal failure) ([place]) Active Problems:   Chronic combined systolic and diastolic CHF (congestive heart failure) (HCC)   Chronic kidney disease, stage 3b (HCC)   DVT (deep venous thrombosis) (HCC)   Rheumatoid arthritis (HCC)   DMII (diabetes mellitus, type 2) (HCC)     History of present illness:    70 year old female with history of diffuse aorta and coronary calcifications noted on chest CT on 07/2017, chronic combined CHF with a EF of 35 to 40% on recent echo, SVT, prior PE on lifelong anticoagulation, asthma, tracheobronchomalacia on CPAP, bronchiectasis, vocal cord dysfunction, seronegative rheumatoid arthritis, CKD stage III who was recently mated to the hospital for septic right elbow olecranon bursitis and cellulitis of right hand.  She underwent incision and drainage on 09/22/2020.  Cardiology was consulted, and she was started on Entresto and bisoprolol.  Discharged on 09/30/2020.   Patient was readmitted with AKI, creatinine of 4.07.  Her creatinine was 1.8 on discharge on 11th August   Hospital Course:    Acute on chronic kidney disease stage III -Baseline creatinine around 1.6-1.8 -Presented with creatinine of 4.07; likely from Entresto and hypotension -[name] is currently on hold -Started on IV normal saline; creatinine improved to 1.43 -IV fluids were discontinued       Acute on chronic  combined systolic and diastolic CHF -Last EF was 35 to 40% in July 2022 -Entresto and bisoprolol were held -Cardiology  has restarted bisoprolol, Entresto is still on hold -Diuretics on hold -Repeat BNP showed elevation at 3823.5 -Diuresed 1.5 L with low-dose Lasix 20 mg daily  -Continue Lasix 20 mg p.o. daily.   -Started on BiDil 1 tablet p.o. 3 times daily by cardiology -Cardiology has signed off, recommendations per cardiology include Medication Recommendations:  Atorvastatin 41m daily, Bisoprolol 2.573mdaily, Lasix 2014maily, Bidil 20-37.5mg90mD ( may need 2 tabs TID if BP remains uncontrolled),  Other recommendations (labs, testing, etc):  BMET in 1 week Follow up as an outpatient:  Followup with Dr. ChriHarrell Gave1-2 weeks      COPD exacerbation -Resolved, -Continue Brovana, Incruse Ellipta -As needed albuterol nebulizer --Continue 2 L/min oxygen at bedtime   Diabetes mellitus type 2 -Hemoglobin A1c was 8.1, 2 weeks ago -CBGs elevated due to steroids -Steroids have been weaned off to prednisone 20 mg p.o. daily -Continue taking prednisone 12 mg daily -Continue sensitive sliding scale insulin with NovoLog     History of rheumatoid arthritis -Patient is on prednisone and Arava at home -Started on Solu-Cortef stress dose 50 mg every 8 hours -Solu-Cortef has been weaned off -Patient takes prednisone 12 mg daily at home     History of DVT/PE -Patient is on Xarelto at home -[place]   Recent septic olecranon bursitis -S/p debridement during last admission -Culture grew Candida albicans; started on fluconazole till October 20, 2020 -Patient to follow-up with orthopedic surgery on September 2   Leukocytosis -Likely from steroids  10/27/20 Here for follow up.  There seems to be a fair amount of confusion regarding her medication list.  The medicines from the hospital and the medicines from the nursing home are contradictory.  Therefore we spent 30 minutes today going over her medication list and trying to simplify.  Her blood pressure  this morning at her infectious disease specialist was barely above 90 systolic.  This afternoon is better but she has been rushing to get here.  Come to find out she has not been taking her bisoprolol for the last 3 days since leaving the nursing home.  Despite that her blood pressure this morning was still extremely low.  She has recently increased her Lasix to 40 mg a day up from 20.  Her A1c in the hospital was 8.4.  She would be an excellent candidate for [place] if her blood pressure and kidneys can tolerate it.  Today her lungs on exam sound clear with no wheezes or crackles or rails.  She does have +2 edema distal to the knees in both legs however I do not believe that she can tolerate increased diuresis due to hypotension.  At that time, my plan was: We tried to simplify her medication list.  First for her anticoagulant she will be on Xarelto once daily.  Second for her COPD/asthma she will be on Stiolto 2 puffs inhaled daily with as needed albuterol.  Third for her congestive heart failure she will be on BiDil 1/2 tablet twice daily.  Due to hypotension were going to hold bisoprolol for now and recheck her blood pressure next week and decide if she can resume bisoprolol.  Decrease Lasix to 20 mg a day due to hypotension.  Regarding her chronic pain, I think reducing her overall medication would help prevent dizziness and fall so of asked her to try to wean off and discontinue gabapentin which she is taking for neuropathic pain.  This may also help some of the swelling.  Of asked her to use tramadol twice daily as needed for pain.  Third regarding her rheumatoid arthritis she will take prednisone 12 mg a day.  Regarding her allergies she will be on Astelin, Singulair, Flonase.  Regarding her gout she will be taking allopurinol 100 mg daily and use colchicine only as needed.  Recheck blood pressure next week.  Meanwhile today check CBC and CMP.  11/03/20 Patient is here today for follow-up.  Her blood  pressure is better.  Her swelling in her legs is actually improved.  She looks stronger today.  She still reports feeling weak and tired however she does appear to be stronger.  Her most recent A1c was 8.1.  She has known congestive heart failure with an ejection fraction of 30%.  She is currently taking BiDil 1/2 tablet twice daily.  She is currently holding bisoprolol due to hypotension.  She is also holding Lasix due to hypotension.  Today on examination she does not appear to be fluid overloaded.  She denies any chest pain.  There is only trace bipedal edema today which is markedly improved from last week.   Past Medical History:  Diagnosis Date   Abscess of dorsum of right hand 09/22/2020   Acute deep vein thrombosis (DVT) of right lower extremity ([place]) 12/13/2017   Allergic rhinitis    Anemia    Angio-edema    Anxiety    pt denies   Arthritis    Phreesia 10/21/2019   Asthma    Phreesia 10/21/2019   Breast cancer ([place]) 1998  in remission   Cataract    REMOVED   Clostridium difficile colitis 01/14/2018   Clostridium difficile diarrhea 02/58/5277   Complication of anesthesia    "had hard time waking up from it several times" (02/20/2012)   Depression    "some; don't take anything for it" (02/20/2012)   Diverticulosis    DVT (deep venous thrombosis) ([place])    Fibromyalgia 11/2011   GERD (gastroesophageal reflux disease)    Graves disease    Headache(784.0)    "related to allergies; more at different times during the year" (02/20/2012)   Hemorrhoids    Hiatal hernia    back and neck   Hx of adenomatous colonic polyps 04/12/2016   Hypercholesteremia    good cholesterol is high   Hypothyroidism    IBS (irritable bowel syndrome)    Moderate persistent asthma    -FeV1 72% 2011, -IgE 102 2011, CT sinus Neg 2011   Osteoporosis    on reclast yearly   Peripheral vascular disease ([place]) 2019   DVTs   Pneumonia 04/2011; ~ 11/2011   "double; single" (02/20/2012)   Septic olecranon  bursitis of right elbow 09/22/2020   Seronegative rheumatoid arthritis ([place])    Dr. Lahoma Rocker   SIRS (systemic inflammatory response syndrome) ([place]) 02/10/2018   Tracheobronchomalacia    Past Surgical History:  Procedure Laterality Date   ABDOMINAL HYSTERECTOMY N/A    Phreesia 10/21/2019   ANTERIOR AND POSTERIOR REPAIR  1990's   APPENDECTOMY     BREAST LUMPECTOMY  1998   left   BREAST SURGERY N/A    Phreesia 10/21/2019   BRONCHIAL WASHINGS  04/05/2020   Procedure: BRONCHIAL WASHINGS;  Surgeon: Freddi Starr, MD;  Location: [facility];  Service: Pulmonary;;   CARPOMETACARPEL ([place]) FUSION OF THUMB WITH AUTOGRAFT FROM RADIUS  ~ 2009   "both thumbs" (02/20/2012)   CATARACT EXTRACTION W/ INTRAOCULAR LENS  IMPLANT, BILATERAL  2012   CERVICAL DISCECTOMY  10/2001   C5-C6   CERVICAL FUSION  2003   C3-C4   CHOLECYSTECTOMY     COLONOSCOPY     DEBRIDEMENT TENNIS ELBOW  ?1970's   right   ESOPHAGOGASTRODUODENOSCOPY     EYE SURGERY N/A    Phreesia 10/21/2019   HYSTERECTOMY     I & D EXTREMITY Right 09/22/2020   Procedure: IRRIGATION AND DEBRIDEMENT RIGHT HAND AND ELBOW;  Surgeon: Marchia Bond, MD;  Location: [facility];  Service: Orthopedics;  Laterality: Right;   KNEE ARTHROPLASTY  ?1990's   "?right; w/cartilage repair" (02/20/2012)   NASAL SEPTUM SURGERY  1980's   POSTERIOR CERVICAL FUSION/FORAMINOTOMY  2004   "failed initial fusion; rewired  anterior neck" (02/20/2012)   REVERSE SHOULDER ARTHROPLASTY Right 02/16/2020   Procedure: REVERSE SHOULDER ARTHROPLASTY;  Surgeon: Marchia Bond, MD;  Location: [facility];  Service: Orthopedics;  Laterality: Right;   SPINE SURGERY N/A    Phreesia 10/21/2019   TONSILLECTOMY  ~ 1953   VESICOVAGINAL FISTULA CLOSURE W/ TAH  1988   VIDEO BRONCHOSCOPY Bilateral 08/23/2016   Procedure: VIDEO BRONCHOSCOPY WITH FLUORO;  Surgeon: Javier Glazier, MD;  Location: [facility];  Service: Cardiopulmonary;  Laterality: Bilateral;   VIDEO BRONCHOSCOPY N/A  04/05/2020   Procedure: VIDEO BRONCHOSCOPY WITHOUT FLUORO;  Surgeon: Freddi Starr, MD;  Location: [facility];  Service: Pulmonary;  Laterality: N/A;   Current Outpatient Medications on File Prior to Visit  Medication Sig Dispense Refill   albuterol (PROVENTIL) (2.5 MG/3ML) 0.083% nebulizer solution Take 3 mLs (2.5 mg total) by nebulization  every 4 (four) hours as needed for wheezing or shortness of breath. 75 mL 12   allopurinol (ZYLOPRIM) 100 MG tablet Take 100 mg by mouth daily.     atorvastatin (LIPITOR) 80 MG tablet Take 1 tablet (80 mg total) by mouth daily.     azelastine (ASTELIN) 0.1 % nasal spray Use in each nostril as directed 30 mL 5   bisoprolol (ZEBETA) 5 MG tablet Take 0.5 tablets (2.5 mg total) by mouth daily.     Blood Glucose Monitoring Suppl (BLOOD GLUCOSE SYSTEM PAK) KIT Please dispense per patient and insurance preference. Use as directed to monitor FSBS 4x . Dx: E11.65 1 kit 0   cetirizine (ZYRTEC) 10 MG tablet Take 1 tablet (10 mg total) by mouth daily. 30 tablet 5   cholecalciferol (VITAMIN D) 25 MCG (1000 UT) tablet Take 1,000 Units by mouth daily.      Continuous Blood Gluc Receiver (FREESTYLE LIBRE 14 DAY READER) DEVI Apply 1 each topically daily as needed. USE AS DIRECTED TO MONITOR BLOOD GLUCOSE LEVELS. DX: E11.9. 1 each 1   Continuous Blood Gluc Sensor (FREESTYLE LIBRE 14 DAY SENSOR) MISC Apply 1 each topically every 14 (fourteen) days. USE AS DIRECTED TO MONITOR BLOOD GLUCOSE LEVELS. DX: E11.9. 2 each 11   cyproheptadine (PERIACTIN) 4 MG tablet TAKE 2 TABLETS BY MOUTH EVERY EVENING 180 tablet 2   DEXILANT 60 MG capsule TAKE 1 CAPSULE BY MOUTH EACH MORNING 90 capsule 3   EPINEPHrine (EPIPEN 2-PAK) 0.3 mg/0.3 mL IJ SOAJ injection Inject 0.3 mLs (0.3 mg total) into the muscle Once PRN. 0.3 mL 2   famotidine (PEPCID) 20 MG tablet Take 20 mg by mouth at bedtime.     fluticasone (FLONASE) 50 MCG/ACT nasal spray USE 2 SPRAYS IN EACH NOSTRIL DAILY 16 g 2   folic acid  (FOLVITE) 1 MG tablet Take 1 mg by mouth daily.     furosemide (LASIX) 20 MG tablet Take 1 tablet (20 mg total) by mouth daily. (Patient taking differently: Take 60 mg by mouth daily. 2 tabs am, 1 tab pm) 30 tablet    gabapentin (NEURONTIN) 600 MG tablet TAKE 1 TABLET BY MOUTH DAILY 90 tablet 3   Glucose Blood (BLOOD GLUCOSE TEST STRIPS) STRP Please dispense per patient and insurance preference. Use as directed to monitor FSBS 4x . Dx: E11.65 150 strip 1   guaiFENesin (MUCINEX) 600 MG 12 hr tablet Take 1 tablet (600 mg total) by mouth 2 (two) times daily as needed for cough or to loosen phlegm. 60 tablet 1   insulin aspart (NOVOLOG) 100 UNIT/ML injection Inject 0-9 Units into the skin 3 (three) times daily with meals. Sliding scale insulin Less than 70 initiate hypoglycemia protocol 70-120  0 units 120-150 1 unit 151-200 2 units 201-250 3 units 251-300 5 units 301-350 7 units 351-400 9 units Greater than 400 call MD 10 mL 11   isosorbide-hydrALAZINE (BIDIL) 20-37.5 MG tablet Take 0.5 tablets by mouth 2 (two) times daily.     magnesium oxide (MAG-OX) 400 MG tablet Take 400 mg by mouth daily.     meclizine (ANTIVERT) 25 MG tablet Take 25 mg by mouth 3 (three) times daily as needed for dizziness.     mirtazapine (REMERON SOL-TAB) 30 MG disintegrating tablet DISSOLVE 1 TABLET BY MOUTH EVERY NIGHT AT BEDTIME 90 tablet 1   MITIGARE 0.6 MG CAPS Take 0.6 mg by mouth every other day.     montelukast (SINGULAIR) 10 MG tablet Take 1 tablet (10  mg total) by mouth daily. 30 tablet 5   predniSONE (DELTASONE) 1 MG tablet Take 12 tablets (12 mg total) by mouth daily with breakfast.     Propylene Glycol (SYSTANE COMPLETE) 0.6 % SOLN Place 1 drop into both eyes 2 (two) times daily.     Tiotropium Bromide-Olodaterol (STIOLTO RESPIMAT) 2.5-2.5 MCG/ACT AERS Inhale 2 puffs into the lungs daily. 4 g 5   XARELTO 20 MG TABS tablet TAKE 1 TABLET BY MOUTH DAILY WITH SUPPER 90 tablet 3   [DISCONTINUED] potassium chloride SA  (KLOR-CON) 20 MEQ tablet Take 1 tablet (20 mEq total) by mouth daily. (Patient not taking: Reported on 09/23/2020) 30 tablet 3   Current Facility-Administered Medications on File Prior to Visit  Medication Dose Route Frequency Provider Last Rate Last Admin   denosumab (PROLIA) injection 60 mg  60 mg Subcutaneous Q6 months Kinslie Hove T, MD   60 mg at 03/31/20 1224   Allergies  Allergen Reactions   Baclofen Other (See Comments)    Altered mental status requiring a 3-day hospital stay- patient became unresponsive   Dust Mite Extract Shortness Of Breath and Other (See Comments)    "sneezing" (02/20/2012)   Molds & Smuts Shortness Of Breath   Morphine And Related Hives and Itching   Other Shortness Of Breath and Other (See Comments)    Grass and weeds "sneezing; filled sinuses" (02/20/2012)   Penicillins Rash and Other (See Comments)    "welts" (02/20/2012) Tolerated Ancef 02/16/20.    Rofecoxib Swelling and Other (See Comments)    Vioxx- feet swelling    Shellfish Allergy Anaphylaxis, Shortness Of Breath, Itching and Rash   Shrimp Flavor Anaphylaxis and Other (See Comments)    ALL SHELLFISH, also   Tetracycline Hcl Nausea And Vomiting   Xolair [Omalizumab] Other (See Comments)    Caused Blood clot   Zoledronic Acid Other (See Comments)    Fever, Put in hospital, dr said it was a reaction from a reaction  Other reaction(s): Unknown   Dilaudid [Hydromorphone Hcl] Itching   Levofloxacin Other (See Comments)    GI upset   Oxycodone Hcl Nausea And Vomiting   Paroxetine Nausea And Vomiting and Other (See Comments)    Paxil    Celecoxib Swelling and Other (See Comments)    Feet swelling   Dilaudid [Hydromorphone] Other (See Comments)    "Allergic," per [facility]   Diltiazem Swelling   Diltiazem Hcl Swelling   Hydrocodone-Acetaminophen Nausea And Vomiting and Other (See Comments)    "Allergic," per [facility]   Lactose Intolerance (Gi) Other (See Comments)    Bloating and gas   Morphine Sulfate  Other (See Comments)    "Gets loopy"   Oxycodone-Acetaminophen Other (See Comments)    Reaction??   Strawberry Flavor Other (See Comments)    Reaction?   Tree Extract Other (See Comments)    "tested and told I was allergic to it; never experienced a reaction to it" (02/20/2012)   Penicillin G Procaine Rash and Other (See Comments)    Welts, also   Social History   Socioeconomic History   Marital status: Divorced    Spouse name: Not on file   Number of children: 2   Years of education: college   Highest education level: Not on file  Occupational History   Occupation: Disabled    Comment: Retired Engineer, production: RETIRED  Tobacco Use   Smoking status: Never    Passive exposure: Yes   Smokeless  tobacco: Never   Tobacco comments:    Parents  Vaping Use   Vaping Use: Never used  Substance and Sexual Activity   Alcohol use: Not Currently    Alcohol/week: 0.0 standard drinks   Drug use: No   Sexual activity: Never  Other Topics Concern   Not on file  Social History Narrative   Patient lives at home alone. Patient  divorced.    Patient has her BS degree.   Right handed.   Caffeine- sometimes coffee.      [facility]:   Born in [place], [place]. She worked as a Copywriter, advertising. She has no pets currently. She does have indoor plants. Previously had mold in her home that was remediated. Carpet was removed.          Social Determinants of Health   Financial Resource Strain: Low Risk    Difficulty of Paying Living Expenses: Not hard at all  Food Insecurity: No Food Insecurity   Worried About Charity fundraiser in the Last Year: Never true   [place] in the Last Year: Never true  Transportation Needs: No Transportation Needs   Lack of Transportation (Medical): No   Lack of Transportation (Non-Medical): No  Physical Activity: Inactive   Days of Exercise per Week: 0 days   Minutes of Exercise per Session: 0 min  Stress: No Stress  Concern Present   Feeling of Stress : Not at all  Social Connections: Moderately Isolated   Frequency of Communication with Friends and Family: More than three times a week   Frequency of Social Gatherings with Friends and Family: More than three times a week   Attends Religious Services: Never   Marine scientist or Organizations: Yes   Attends Archivist Meetings: Never   Marital Status: Divorced  Human resources officer Violence: Not At Risk   Fear of Current or Ex-Partner: No   Emotionally Abused: No   Physically Abused: No   Sexually Abused: No     Review of Systems     Objective:   Physical Exam Vitals reviewed.  Constitutional:      General: She is not in acute distress.    Appearance: Normal appearance. She is not ill-appearing or toxic-appearing.  HENT:     Right Ear: Tympanic membrane and ear canal normal.     Left Ear: Tympanic membrane and ear canal normal.     Nose: No congestion or rhinorrhea.  Cardiovascular:     Rate and Rhythm: Normal rate and regular rhythm.     Heart sounds: Normal heart sounds.  Pulmonary:     Effort: Pulmonary effort is normal. No respiratory distress.     Breath sounds: No stridor or decreased air movement. Wheezing present. No rales.  Abdominal:     General: Bowel sounds are normal. There is no distension.     Palpations: Abdomen is soft.     Tenderness: There is no abdominal tenderness. There is no guarding.  Musculoskeletal:     Right lower leg: Edema present.     Left lower leg: Edema present.  Skin:    Findings: No erythema.  Neurological:     Mental Status: She is alert and oriented to person, place, and time.     Cranial Nerves: No cranial nerve deficit.     Sensory: No sensory deficit.     Motor: No weakness.     Coordination: Coordination normal.  Moon facies, buffalo hump  Assessment & Plan:  Chronic combined systolic and diastolic CHF (congestive heart failure) (HCC)  Severe persistent asthma  without complication  Stage 3b chronic kidney disease ([place])  Type 2 diabetes mellitus with other specified complication, unspecified whether long term insulin use ([place]) Patient will be an excellent candidate for [place].  We discussed this.  However she is concerned about bladder infections and yeast infections.  She cannot take metformin due to her kidney issues.  Actos would be a poor option given her heart failure.  She has had a poor appetite and nausea so I do not feel that Trulicity or a GLP-1 is a good option.  This essentially leaves glipizide, Januvia, and [place].  We discussed all of these options and she ultimately elected to try low-dose glipizide extended release 5 mg daily.  I want to see her back in 1 month to review her sugars.  We will increase BiDil to 1 tablet twice daily in an effort to get her on maximum tolerated medical therapy for congestive heart failure.  In 1 month if she is tolerating this and her blood pressure is better, I will add back the bisoprolol.  If she is unable to tolerate the glipizide or if she changes her mind, I have encouraged her to consider Wilder Glade due to the other benefits that it possesses including renal protection and decreasing mortality due to heart failure

## 2020-11-04 ENCOUNTER — Telehealth: Payer: Self-pay

## 2020-11-04 ENCOUNTER — Telehealth: Payer: Self-pay | Admitting: Allergy and Immunology

## 2020-11-04 ENCOUNTER — Other Ambulatory Visit: Payer: Self-pay

## 2020-11-04 LAB — FUNGUS CULTURE WITH STAIN

## 2020-11-04 LAB — FUNGUS CULTURE RESULT

## 2020-11-04 LAB — FUNGAL ORGANISM REFLEX

## 2020-11-04 NOTE — Telephone Encounter (Signed)
Patient said she is anemic and had blood work done. The results came back yesterday that she was anemic to something she had never heard of. She would like someone to call her back to discuss.

## 2020-11-04 NOTE — Telephone Encounter (Signed)
Called the patient to let her know cetirizine is not covered by her insurance. She has been buying it over the counter I am mailing out coupons to help with cost.

## 2020-11-04 NOTE — Telephone Encounter (Signed)
Pt just spoke to tammy and she was able to answer all her questions for her

## 2020-11-08 DIAGNOSIS — J4550 Severe persistent asthma, uncomplicated: Secondary | ICD-10-CM | POA: Diagnosis not present

## 2020-11-08 DIAGNOSIS — N1832 Chronic kidney disease, stage 3b: Secondary | ICD-10-CM | POA: Diagnosis not present

## 2020-11-08 DIAGNOSIS — E1151 Type 2 diabetes mellitus with diabetic peripheral angiopathy without gangrene: Secondary | ICD-10-CM | POA: Diagnosis not present

## 2020-11-08 DIAGNOSIS — K219 Gastro-esophageal reflux disease without esophagitis: Secondary | ICD-10-CM | POA: Diagnosis not present

## 2020-11-08 DIAGNOSIS — M069 Rheumatoid arthritis, unspecified: Secondary | ICD-10-CM | POA: Diagnosis not present

## 2020-11-08 DIAGNOSIS — F32A Depression, unspecified: Secondary | ICD-10-CM | POA: Diagnosis not present

## 2020-11-08 DIAGNOSIS — M71121 Other infective bursitis, right elbow: Secondary | ICD-10-CM | POA: Diagnosis not present

## 2020-11-08 DIAGNOSIS — E1122 Type 2 diabetes mellitus with diabetic chronic kidney disease: Secondary | ICD-10-CM | POA: Diagnosis not present

## 2020-11-08 DIAGNOSIS — I5042 Chronic combined systolic (congestive) and diastolic (congestive) heart failure: Secondary | ICD-10-CM | POA: Diagnosis not present

## 2020-11-08 LAB — STREP PNEUMONIAE 23 SEROTYPES IGG
Pneumo Ab Type 1*: 0.1 ug/mL — ABNORMAL LOW (ref >1.3)
Pneumo Ab Type 12 (12F)*: <0.1 ug/mL — ABNORMAL LOW (ref >1.3)
Pneumo Ab Type 14*: 0.3 ug/mL — ABNORMAL LOW (ref >1.3)
Pneumo Ab Type 17 (17F)*: 1 ug/mL — ABNORMAL LOW (ref >1.3)
Pneumo Ab Type 19 (19F)*: 0.3 ug/mL — ABNORMAL LOW (ref >1.3)
Pneumo Ab Type 2*: <0.1 ug/mL — ABNORMAL LOW (ref >1.3)
Pneumo Ab Type 20*: <0.1 ug/mL — ABNORMAL LOW (ref >1.3)
Pneumo Ab Type 22 (22F)*: <0.1 ug/mL — ABNORMAL LOW (ref >1.3)
Pneumo Ab Type 23 (23F)*: <0.1 ug/mL — ABNORMAL LOW (ref >1.3)
Pneumo Ab Type 26 (6B)*: <0.1 ug/mL — ABNORMAL LOW (ref >1.3)
Pneumo Ab Type 3*: 0.2 ug/mL — ABNORMAL LOW (ref >1.3)
Pneumo Ab Type 34 (10A)*: 0.1 ug/mL — ABNORMAL LOW (ref >1.3)
Pneumo Ab Type 4*: 0.2 ug/mL — ABNORMAL LOW (ref >1.3)
Pneumo Ab Type 43 (11A)*: 0.3 ug/mL — ABNORMAL LOW (ref >1.3)
Pneumo Ab Type 5*: 0.6 ug/mL — ABNORMAL LOW (ref >1.3)
Pneumo Ab Type 51 (7F)*: 0.1 ug/mL — ABNORMAL LOW (ref >1.3)
Pneumo Ab Type 54 (15B)*: <0.1 ug/mL — ABNORMAL LOW (ref >1.3)
Pneumo Ab Type 56 (18C)*: 0.5 ug/mL — ABNORMAL LOW (ref >1.3)
Pneumo Ab Type 57 (19A)*: 2.5 ug/mL (ref >1.3)
Pneumo Ab Type 68 (9V)*: 0.5 ug/mL — ABNORMAL LOW (ref >1.3)
Pneumo Ab Type 70 (33F)*: <0.1 ug/mL — ABNORMAL LOW (ref >1.3)
Pneumo Ab Type 8*: 0.3 ug/mL — ABNORMAL LOW (ref >1.3)
Pneumo Ab Type 9 (9N)*: 0.2 ug/mL — ABNORMAL LOW (ref >1.3)

## 2020-11-08 LAB — ACID FAST CULTURE WITH REFLEXED SENSITIVITIES (MYCOBACTERIA)
Acid Fast Culture: NEGATIVE
Acid Fast Culture: NEGATIVE

## 2020-11-08 LAB — SAR COV2 SEROLOGY (COVID19)AB(IGG),IA
SARS-CoV-2 Semi-Quant IgG Ab: <13 AU/mL (ref <13.0)
SARS-CoV-2 Spike Ab Interp: NEGATIVE

## 2020-11-08 LAB — IGG, IGA, IGM
IgA/Immunoglobulin A, Serum: 59 mg/dL — ABNORMAL LOW (ref 64–422)
IgG (Immunoglobin G), Serum: 285 mg/dL — ABNORMAL LOW (ref 586–1602)
IgM (Immunoglobulin M), Srm: 51 mg/dL (ref 26–217)

## 2020-11-08 LAB — TETANUS ANTIBODY, IGG: Tetanus Ab, IgG: 0.67 IU/mL (ref <0.10)

## 2020-11-09 ENCOUNTER — Telehealth: Payer: Self-pay

## 2020-11-09 ENCOUNTER — Other Ambulatory Visit: Payer: Self-pay | Admitting: *Deleted

## 2020-11-09 DIAGNOSIS — J4550 Severe persistent asthma, uncomplicated: Secondary | ICD-10-CM | POA: Diagnosis not present

## 2020-11-09 DIAGNOSIS — N1832 Chronic kidney disease, stage 3b: Secondary | ICD-10-CM | POA: Diagnosis not present

## 2020-11-09 DIAGNOSIS — M71121 Other infective bursitis, right elbow: Secondary | ICD-10-CM | POA: Diagnosis not present

## 2020-11-09 DIAGNOSIS — M069 Rheumatoid arthritis, unspecified: Secondary | ICD-10-CM | POA: Diagnosis not present

## 2020-11-09 DIAGNOSIS — F32A Depression, unspecified: Secondary | ICD-10-CM | POA: Diagnosis not present

## 2020-11-09 DIAGNOSIS — K219 Gastro-esophageal reflux disease without esophagitis: Secondary | ICD-10-CM | POA: Diagnosis not present

## 2020-11-09 DIAGNOSIS — E1151 Type 2 diabetes mellitus with diabetic peripheral angiopathy without gangrene: Secondary | ICD-10-CM | POA: Diagnosis not present

## 2020-11-09 DIAGNOSIS — E1122 Type 2 diabetes mellitus with diabetic chronic kidney disease: Secondary | ICD-10-CM | POA: Diagnosis not present

## 2020-11-09 DIAGNOSIS — I5042 Chronic combined systolic (congestive) and diastolic (congestive) heart failure: Secondary | ICD-10-CM | POA: Diagnosis not present

## 2020-11-09 MED ORDER — PREDNISONE 1 MG PO TABS: 2.0000 mg | ORAL_TABLET | Freq: Every day | ORAL | 3 refills | Status: DC

## 2020-11-09 MED ORDER — PREDNISONE 10 MG PO TABS: 10.0000 mg | ORAL_TABLET | Freq: Every day | ORAL | 3 refills | Status: DC

## 2020-11-09 MED ORDER — ISOSORB DINITRATE-HYDRALAZINE 20-37.5 MG PO TABS: 1.0000 | ORAL_TABLET | Freq: Two times a day (BID) | ORAL | 3 refills | Status: DC

## 2020-11-09 NOTE — Telephone Encounter (Signed)
Pt called in asking about getting a prior authorization for a blood pressure med that was prescribed for her. Please advise.  Cb#: [phone]

## 2020-11-09 NOTE — Telephone Encounter (Signed)
Call placed to patient.   States that she requires refill on Bidil.   Prescription sent to pharmacy.

## 2020-11-10 ENCOUNTER — Telehealth: Payer: Self-pay | Admitting: Family Medicine

## 2020-11-10 NOTE — Telephone Encounter (Signed)
Patient called to follow up on issue with aching swollen feet; requesting to know how Dr. Dennard Schaumann wants her to proceed with taking Lasix. Please advise asap at [phone].

## 2020-11-10 NOTE — Telephone Encounter (Signed)
Received VM from patient.   Reports that she stopped taking Lasix PO QD as PCP requested. States that he feet noted to swell and were painful.   Inquired if she should resume daily lasix.   Please advise.

## 2020-11-11 ENCOUNTER — Telehealth: Payer: Self-pay | Admitting: Family Medicine

## 2020-11-11 DIAGNOSIS — M71121 Other infective bursitis, right elbow: Secondary | ICD-10-CM | POA: Diagnosis not present

## 2020-11-11 DIAGNOSIS — J4550 Severe persistent asthma, uncomplicated: Secondary | ICD-10-CM | POA: Diagnosis not present

## 2020-11-11 DIAGNOSIS — F32A Depression, unspecified: Secondary | ICD-10-CM | POA: Diagnosis not present

## 2020-11-11 DIAGNOSIS — K219 Gastro-esophageal reflux disease without esophagitis: Secondary | ICD-10-CM | POA: Diagnosis not present

## 2020-11-11 DIAGNOSIS — E1151 Type 2 diabetes mellitus with diabetic peripheral angiopathy without gangrene: Secondary | ICD-10-CM | POA: Diagnosis not present

## 2020-11-11 DIAGNOSIS — E1122 Type 2 diabetes mellitus with diabetic chronic kidney disease: Secondary | ICD-10-CM | POA: Diagnosis not present

## 2020-11-11 DIAGNOSIS — N1832 Chronic kidney disease, stage 3b: Secondary | ICD-10-CM | POA: Diagnosis not present

## 2020-11-11 DIAGNOSIS — M069 Rheumatoid arthritis, unspecified: Secondary | ICD-10-CM | POA: Diagnosis not present

## 2020-11-11 DIAGNOSIS — I5042 Chronic combined systolic (congestive) and diastolic (congestive) heart failure: Secondary | ICD-10-CM | POA: Diagnosis not present

## 2020-11-11 MED ORDER — FUROSEMIDE 40 MG PO TABS: 40.0000 mg | ORAL_TABLET | Freq: Every day | ORAL | 1 refills | Status: DC

## 2020-11-11 NOTE — Telephone Encounter (Signed)
Call placed to patient and patient made aware.  

## 2020-11-11 NOTE — Telephone Encounter (Signed)
Patient called to report she's already taking 20mg  of lasix; wants to know if she should increase it to 40mg . Patient stated lasix isn't helping much; feet and legs are still swollen. Patient requesting reply before end of business today. Please advise at [phone]

## 2020-11-11 NOTE — Telephone Encounter (Signed)
Ok to increase dose.

## 2020-11-14 DIAGNOSIS — M0600 Rheumatoid arthritis without rheumatoid factor, unspecified site: Secondary | ICD-10-CM | POA: Diagnosis not present

## 2020-11-14 DIAGNOSIS — J4540 Moderate persistent asthma, uncomplicated: Secondary | ICD-10-CM | POA: Diagnosis not present

## 2020-11-14 DIAGNOSIS — J471 Bronchiectasis with (acute) exacerbation: Secondary | ICD-10-CM | POA: Diagnosis not present

## 2020-11-14 DIAGNOSIS — G894 Chronic pain syndrome: Secondary | ICD-10-CM | POA: Diagnosis not present

## 2020-11-14 DIAGNOSIS — G4736 Sleep related hypoventilation in conditions classified elsewhere: Secondary | ICD-10-CM | POA: Diagnosis not present

## 2020-11-14 DIAGNOSIS — G4733 Obstructive sleep apnea (adult) (pediatric): Secondary | ICD-10-CM | POA: Diagnosis not present

## 2020-11-14 DIAGNOSIS — J45909 Unspecified asthma, uncomplicated: Secondary | ICD-10-CM | POA: Diagnosis not present

## 2020-11-14 DIAGNOSIS — R0602 Shortness of breath: Secondary | ICD-10-CM | POA: Diagnosis not present

## 2020-11-14 DIAGNOSIS — I5032 Chronic diastolic (congestive) heart failure: Secondary | ICD-10-CM | POA: Diagnosis not present

## 2020-11-15 ENCOUNTER — Other Ambulatory Visit: Payer: Self-pay

## 2020-11-15 ENCOUNTER — Ambulatory Visit (INDEPENDENT_AMBULATORY_CARE_PROVIDER_SITE_OTHER): Payer: Medicare PPO | Admitting: Pulmonary Disease

## 2020-11-15 VITALS — BP 118/60 | HR 90 | Temp 98.1°F | Ht 61.0 in | Wt 126.0 lb

## 2020-11-15 DIAGNOSIS — F32A Depression, unspecified: Secondary | ICD-10-CM | POA: Diagnosis not present

## 2020-11-15 DIAGNOSIS — M069 Rheumatoid arthritis, unspecified: Secondary | ICD-10-CM | POA: Diagnosis not present

## 2020-11-15 DIAGNOSIS — J398 Other specified diseases of upper respiratory tract: Secondary | ICD-10-CM

## 2020-11-15 DIAGNOSIS — J479 Bronchiectasis, uncomplicated: Secondary | ICD-10-CM | POA: Diagnosis not present

## 2020-11-15 DIAGNOSIS — J4550 Severe persistent asthma, uncomplicated: Secondary | ICD-10-CM | POA: Diagnosis not present

## 2020-11-15 DIAGNOSIS — E1151 Type 2 diabetes mellitus with diabetic peripheral angiopathy without gangrene: Secondary | ICD-10-CM | POA: Diagnosis not present

## 2020-11-15 DIAGNOSIS — K219 Gastro-esophageal reflux disease without esophagitis: Secondary | ICD-10-CM | POA: Diagnosis not present

## 2020-11-15 DIAGNOSIS — M71121 Other infective bursitis, right elbow: Secondary | ICD-10-CM | POA: Diagnosis not present

## 2020-11-15 DIAGNOSIS — N1832 Chronic kidney disease, stage 3b: Secondary | ICD-10-CM | POA: Diagnosis not present

## 2020-11-15 DIAGNOSIS — I5042 Chronic combined systolic (congestive) and diastolic (congestive) heart failure: Secondary | ICD-10-CM | POA: Diagnosis not present

## 2020-11-15 DIAGNOSIS — E1122 Type 2 diabetes mellitus with diabetic chronic kidney disease: Secondary | ICD-10-CM | POA: Diagnosis not present

## 2020-11-15 MED ORDER — BUDESONIDE 0.5 MG/2ML IN SUSP: 0.5000 mg | Freq: Two times a day (BID) | RESPIRATORY_TRACT | 12 refills | Status: DC | PRN

## 2020-11-15 NOTE — Patient Instructions (Signed)
Continue stiolto inhaler 2 puffs daily  Start budesonide nebulizer treatments twice daily as needed for cough and wheezing. - Rinse/gargle mouth out effectively after each use - Can use nystatin swish after each nebulizer treatment

## 2020-11-15 NOTE — Progress Notes (Signed)
Synopsis: Referred in 2011 for asthma by Ebony Frizzle, MD.  Previously a patient of Drs. Ebony Scott, Ebony Scott and Ebony Scott.  Subjective:   PATIENT ID: Ebony Scott GENDER: female DOB: [birth year], MRN: [id number]  Increased wheezing. Cough at night.   Coughing up thick yellow sputum.   12.65m prednisone daily for RA, seeing Dr. CTobie Poetof Scott. Only been on prednisone since April.   Starting IVIG infusions.   HPI  Chief Complaint  Patient presents with   Follow-up    Wheezing all the time, wanting a new Medication for it.    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   She reports increased wheezing, cough and dyspnea since last visit. She is having a lot of wheezing at night. She remains on stiolto respimat daily and is using albuterol nebulizer treatments twice daily with flutter valve for mucous clearance. She complains of cough when eating/drinking.  She is being started on IVIG infusions by Dr. 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 She remains on prednisone 163mdaily for her rheumatoid arthritis.  We discussed her CT chest results from 08/23/20 which showed resolution of her pulmonary nodules with residual areas of scarring.  OV 06/14/20 She has completed two courses of diflucan per ENT for laryngeal candidiasis and her voice is much improved. We discontinued her inhaled steroid during this period of time as well. She is noticing more shortness of breath during the day.   She was recently transitioned to rituximab by her rheumatoligist and has been taken off liflunamide. She continues on 1532mf prednisone daily.   CT Chest on 4/6 when compared to the CT Chest on 2/4 shows a decrease in the size of the nodular/consolidative opacities of the medial right lung  base and left lower lobe. No new nodules or lesions noted.   OV 04/29/20 She underwent bronchoscopy on 04/05/20 and cultures remain unrevealing at this time. The BAL samples have grown candida albicans and we discussed that we typically do not treat this finding as it does not usually cause pathogenic disease.   She continues to experience hoarse voice and cough along with shortness of breath. She denies weight loss, night sweats, loss of appetite.   OV 04/01/20: CT scan 03/25/2020 showed multifocal areas of nodularity and consolidative changes. Findings consistent with atypical infection or possible malignancy. She was started on azithromycin and cefpodoxime by Dr. PicDennard Schaumannhe has completed the azithromycin and she started the cefpodoxime on 2/7. She was treated with levaquin on 03/21/20 for 7 days initially until the CT scan resulted. Blood cultures were negative on 02/23/20 when she presented with AMS.   She reports she is coughing up brown sputum. At baseline she doe snot cough up sputum or it is clear. The sputum amount has declined she taking antibiotics. She denies fevers, chills or sweats. Her appetite is well.    OV 09/01/19: Ms. Scott a 69 8o woman who presents for follow up of bronchiectasis, tracheobronchomalacia, immunosuppression, chronic AC for recurrent VTE. She started prescribed levofloxacin last evening for Pseudomonas bronchiectasis AE. So far she has not had nausea associated with taking this med, but took zofran prophylacticaly. She remains SOB with no change so far in her symptoms. She still coughs frequently despite using her inhaler and hypertonic saline nebs + flutter as prescribed. She  has follow up with her PCP soon for hyperglycemiaa. She has follow up with her rheumatologist in August and is hoping that her prednisone will be able to be decreased soon.  Past Medical History:  Diagnosis Date   Abscess of dorsum of right hand 09/22/2020   Acute deep vein thrombosis (DVT) of  right lower extremity ([place]) 12/13/2017   Allergic rhinitis    Anemia    Angio-edema    Anxiety    pt denies   Arthritis    Phreesia 10/21/2019   Asthma    Phreesia 10/21/2019   Breast cancer ([place]) 1998   in remission   Cataract    REMOVED   Clostridium difficile colitis 01/14/2018   Clostridium difficile diarrhea 41/93/7902   Complication of anesthesia    "had hard time waking up from it several times" (02/20/2012)   Depression    "some; don't take anything for it" (02/20/2012)   Diverticulosis    DVT (deep venous thrombosis) ([place])    Fibromyalgia 11/2011   GERD (gastroesophageal reflux disease)    Graves disease    Headache(784.0)    "related to allergies; more at different times during the year" (02/20/2012)   Hemorrhoids    Hiatal hernia    back and neck   Hx of adenomatous colonic polyps 04/12/2016   Hypercholesteremia    good cholesterol is high   Hypothyroidism    IBS (irritable bowel syndrome)    Moderate persistent asthma    -FeV1 72% 2011, -IgE 102 2011, CT sinus Neg 2011   Osteoporosis    on reclast yearly   Peripheral vascular disease ([place]) 2019   DVTs   Pneumonia 04/2011; ~ 11/2011   "double; single" (02/20/2012)   Septic olecranon bursitis of right elbow 09/22/2020   Seronegative rheumatoid arthritis ([place])    Dr. Lahoma Rocker   SIRS (systemic inflammatory response syndrome) ([place]) 02/10/2018   Tracheobronchomalacia      Family History  Problem Relation Age of Onset   Allergies Mother    Heart disease Mother    Arthritis Mother    Lung cancer Mother    Diabetes Mother    Allergies Father    Heart disease Father    Arthritis Father    Stroke Father    Colon cancer Other        Maternal half aunt/Maternal half uncle   Colitis Daughter    Diabetes Maternal Grandfather      Social History   Socioeconomic History   Marital status: Divorced    Spouse name: Not on file   Number of children: 2   Years of education: college   Highest education  level: Not on file  Occupational History   Occupation: Disabled    Comment: Retired Engineer, production: RETIRED  Tobacco Use   Smoking status: Never    Passive exposure: Yes   Smokeless tobacco: Never   Tobacco comments:    Parents  Vaping Use   Vaping Use: Never used  Substance and Sexual Activity   Alcohol use: Not Currently    Alcohol/week: 0.0 standard drinks   Drug use: No   Sexual activity: Never  Other Topics Concern   Not on file  Social History Narrative   Patient lives at home alone. Patient  divorced.    Patient has her BS degree.   Right handed.   Caffeine- sometimes coffee.      [facility]:   Born in [place], [place]. She worked  as a Copywriter, advertising. She has no pets currently. She does have indoor plants. Previously had mold in her home that was remediated. Carpet was removed.          Social Determinants of Health   Financial Resource Strain: Low Risk    Difficulty of Paying Living Expenses: Not hard at all  Food Insecurity: No Food Insecurity   Worried About Charity fundraiser in the Last Year: Never true   [place] in the Last Year: Never true  Transportation Needs: No Transportation Needs   Lack of Transportation (Medical): No   Lack of Transportation (Non-Medical): No  Physical Activity: Inactive   Days of Exercise per Week: 0 days   Minutes of Exercise per Session: 0 min  Stress: No Stress Concern Present   Feeling of Stress : Not at all  Social Connections: Moderately Isolated   Frequency of Communication with Friends and Family: More than three times a week   Frequency of Social Gatherings with Friends and Family: More than three times a week   Attends Religious Services: Never   Marine scientist or Organizations: Yes   Attends Archivist Meetings: Never   Marital Status: Divorced  Human resources officer Violence: Not At Risk   Fear of Current or Ex-Partner: No   Emotionally Abused: No    Physically Abused: No   Sexually Abused: No     Allergies  Allergen Reactions   Baclofen Other (See Comments)    Altered mental status requiring a 3-day hospital stay- patient became unresponsive   Dust Mite Extract Shortness Of Breath and Other (See Comments)    "sneezing" (02/20/2012)   Molds & Smuts Shortness Of Breath   Morphine And Related Hives and Itching   Other Shortness Of Breath and Other (See Comments)    Grass and weeds "sneezing; filled sinuses" (02/20/2012)   Penicillins Rash and Other (See Comments)    "welts" (02/20/2012) Tolerated Ancef 02/16/20.    Rofecoxib Swelling and Other (See Comments)    Vioxx- feet swelling    Shellfish Allergy Anaphylaxis, Shortness Of Breath, Itching and Rash   Shrimp Flavor Anaphylaxis and Other (See Comments)    ALL SHELLFISH, also   Tetracycline Hcl Nausea And Vomiting   Xolair [Omalizumab] Other (See Comments)    Caused Blood clot   Zoledronic Acid Other (See Comments)    Fever, Put in hospital, dr said it was a reaction from a reaction  Other reaction(s): Unknown   Dilaudid [Hydromorphone Hcl] Itching   Levofloxacin Other (See Comments)    GI upset   Oxycodone Hcl Nausea And Vomiting   Paroxetine Nausea And Vomiting and Other (See Comments)    Paxil    Celecoxib Swelling and Other (See Comments)    Feet swelling   Dilaudid [Hydromorphone] Other (See Comments)    "Allergic," per [facility]   Diltiazem Swelling   Diltiazem Hcl Swelling   Hydrocodone-Acetaminophen Nausea And Vomiting and Other (See Comments)    "Allergic," per [facility]   Lactose Intolerance (Gi) Other (See Comments)    Bloating and gas   Morphine Sulfate Other (See Comments)    "Gets loopy"   Oxycodone-Acetaminophen Other (See Comments)    Reaction??   Strawberry Flavor Other (See Comments)    Reaction?   Tree Extract Other (See Comments)    "tested and told I was allergic to it; never experienced a reaction to it" (02/20/2012)   Penicillin G Procaine Rash and Other  (See  Comments)    Welts, also     Outpatient Medications Prior to Visit  Medication Sig Dispense Refill   albuterol (PROVENTIL) (2.5 MG/3ML) 0.083% nebulizer solution Take 3 mLs (2.5 mg total) by nebulization every 4 (four) hours as needed for wheezing or shortness of breath. 75 mL 12   allopurinol (ZYLOPRIM) 100 MG tablet Take 100 mg by mouth daily.     atorvastatin (LIPITOR) 80 MG tablet Take 1 tablet (80 mg total) by mouth daily.     azelastine (ASTELIN) 0.1 % nasal spray Use in each nostril as directed 30 mL 5   benzonatate (TESSALON) 200 MG capsule Take 1 capsule (200 mg total) by mouth 3 (three) times daily as needed for cough. 30 capsule 0   Blood Glucose Monitoring Suppl (BLOOD GLUCOSE SYSTEM PAK) KIT Please dispense per patient and insurance preference. Use as directed to monitor FSBS 4x . Dx: E11.65 1 kit 0   cetirizine (ZYRTEC) 10 MG tablet Take 1 tablet (10 mg total) by mouth daily. 30 tablet 5   cholecalciferol (VITAMIN D) 25 MCG (1000 UT) tablet Take 1,000 Units by mouth daily.      Continuous Blood Gluc Receiver (FREESTYLE LIBRE 14 DAY READER) DEVI Apply 1 each topically daily as needed. USE AS DIRECTED TO MONITOR BLOOD GLUCOSE LEVELS. DX: E11.9. 1 each 1   Continuous Blood Gluc Sensor (FREESTYLE LIBRE 14 DAY SENSOR) MISC Apply 1 each topically every 14 (fourteen) days. USE AS DIRECTED TO MONITOR BLOOD GLUCOSE LEVELS. DX: E11.9. 2 each 11   DEXILANT 60 MG capsule TAKE 1 CAPSULE BY MOUTH EACH MORNING 90 capsule 3   diclofenac Sodium (VOLTAREN) 1 % GEL Apply topically 4 (four) times daily.     EPINEPHrine (EPIPEN 2-PAK) 0.3 mg/0.3 mL IJ SOAJ injection Inject 0.3 mLs (0.3 mg total) into the muscle Once PRN. 0.3 mL 2   famotidine (PEPCID) 20 MG tablet Take 20 mg by mouth at bedtime.     fluticasone (FLONASE) 50 MCG/ACT nasal spray USE 2 SPRAYS IN EACH NOSTRIL DAILY 16 g 2   folic acid (FOLVITE) 1 MG tablet Take 1 mg by mouth daily.     furosemide (LASIX) 40 MG tablet Take 1 tablet  (40 mg total) by mouth daily. 30 tablet 1   glipiZIDE (GLIPIZIDE XL) 5 MG 24 hr tablet Take 1 tablet (5 mg total) by mouth daily with breakfast. 30 tablet 3   Glucose Blood (BLOOD GLUCOSE TEST STRIPS) STRP Please dispense per patient and insurance preference. Use as directed to monitor FSBS 4x . Dx: E11.65 150 strip 1   guaiFENesin (MUCINEX) 600 MG 12 hr tablet Take 1 tablet (600 mg total) by mouth 2 (two) times daily as needed for cough or to loosen phlegm. 60 tablet 1   isosorbide-hydrALAZINE (BIDIL) 20-37.5 MG tablet Take 1 tablet by mouth 2 (two) times daily. 60 tablet 3   magnesium oxide (MAG-OX) 400 MG tablet Take 400 mg by mouth daily.     montelukast (SINGULAIR) 10 MG tablet Take 1 tablet (10 mg total) by mouth daily. 30 tablet 5   predniSONE (DELTASONE) 1 MG tablet Take 2 tablets (2 mg total) by mouth daily with breakfast. Take with additional 79m tablet to equal 180mdaily. 60 tablet 3   predniSONE (DELTASONE) 10 MG tablet Take 1 tablet (10 mg total) by mouth daily with breakfast. Take with additional 2 tabs of 108m73mrednisone to equal 109m34mily. 30 tablet 3   Propylene Glycol (SYSTANE COMPLETE) 0.6 % SOLN  Place 1 drop into both eyes 2 (two) times daily.     Tiotropium Bromide-Olodaterol (STIOLTO RESPIMAT) 2.5-2.5 MCG/ACT AERS Inhale 2 puffs into the lungs daily. 4 g 5   traMADol (ULTRAM) 50 MG tablet Take 50 mg by mouth at bedtime as needed.     XARELTO 20 MG TABS tablet TAKE 1 TABLET BY MOUTH DAILY WITH SUPPER 90 tablet 3   bisoprolol (ZEBETA) 5 MG tablet Take 0.5 tablets (2.5 mg total) by mouth daily. (Patient not taking: Reported on 11/03/2020)     insulin aspart (NOVOLOG) 100 UNIT/ML injection Inject 0-9 Units into the skin 3 (three) times daily with meals. Sliding scale insulin Less than 70 initiate hypoglycemia protocol 70-120  0 units 120-150 1 unit 151-200 2 units 201-250 3 units 251-300 5 units 301-350 7 units 351-400 9 units Greater than 400 call MD (Patient not taking: Reported on  11/03/2020) 10 mL 11   MITIGARE 0.6 MG CAPS Take 0.6 mg by mouth every other day. (Patient not taking: Reported on 11/03/2020)     Facility-Administered Medications Prior to Visit  Medication Dose Route Frequency Provider Last Rate Last Admin   denosumab (PROLIA) injection 60 mg  60 mg Subcutaneous Q6 months Ebony Frizzle, MD   60 mg at 03/31/20 1224   Review of Systems  Constitutional:  Negative for chills, fever, malaise/fatigue and weight loss.  HENT:  Negative for congestion, sinus pain and sore throat.   Eyes: Negative.   Respiratory:  Positive for cough, sputum production, shortness of breath and wheezing. Negative for hemoptysis.   Cardiovascular:  Negative for chest pain, palpitations, orthopnea, claudication and leg swelling.  Gastrointestinal:  Negative for abdominal pain, heartburn, nausea and vomiting.  Genitourinary: Negative.   Musculoskeletal:  Negative for joint pain and myalgias.  Skin:  Negative for rash.  Neurological:  Negative for weakness.  Endo/Heme/Allergies: Negative.   Psychiatric/Behavioral: Negative.     Objective:   Vitals:   11/15/20 1524  BP: 118/60  Pulse: 90  Temp: 98.1 F (36.7 C)  SpO2: 93%  Weight: 126 lb (57.2 kg)  Height: _0  (1.549 m)   Physical Exam Constitutional:      General: She is not in acute distress.    Appearance: She is not ill-appearing.  HENT:     Head: Normocephalic and atraumatic.  Eyes:     General: No scleral icterus.    Conjunctiva/sclera: Conjunctivae normal.     Pupils: Pupils are equal, round, and reactive to light.  Cardiovascular:     Rate and Rhythm: Normal rate and regular rhythm.     Pulses: Normal pulses.     Heart sounds: Normal heart sounds. No murmur heard. Pulmonary:     Effort: Pulmonary effort is normal.     Breath sounds: No wheezing, rhonchi or rales.  Abdominal:     General: Bowel sounds are normal.     Palpations: Abdomen is soft.  Musculoskeletal:     Right lower leg: No edema.      Left lower leg: No edema.  Skin:    General: Skin is warm and dry.  Neurological:     General: No focal deficit present.     Mental Status: She is alert.   CBC    Component Value Date/Time   WBC 10.8 10/27/2020 1527   RBC 3.63 (L) 10/27/2020 1527   HGB 11.5 (L) 10/27/2020 1527   HCT 35.7 10/27/2020 1527   PLT 211 10/27/2020 1527   MCV 98.3 10/27/2020 1527  MCH 31.7 10/27/2020 1527   MCHC 32.2 10/27/2020 1527   RDW 16.1 (H) 10/27/2020 1527   LYMPHSABS 464 (L) 10/27/2020 1527   MONOABS 1.0 10/04/2020 1455   EOSABS 22 10/27/2020 1527   BASOSABS 43 10/27/2020 1527   BMP Latest Ref Rng & Units 10/27/2020 10/11/2020 10/10/2020  Glucose 65 - 99 mg/dL 241(H) 96 100(H)  BUN 7 - 25 mg/dL 53(H) 40(H) 33(H)  Creatinine 0.60 - 1.00 mg/dL 1.68(H) 1.43(H) 1.24(H)  BUN/Creat Ratio 6 - 22 (calc) 32(H) - -  Sodium 135 - 146 mmol/L 138 142 141  Potassium 3.5 - 5.3 mmol/L 4.2 3.9 4.0  Chloride 98 - 110 mmol/L 95(L) 109 112(H)  CO2 20 - 32 mmol/L 28 23 20(L)  Calcium 8.6 - 10.4 mg/dL 9.5 8.5(L) 8.5(L)   Chest imaging: Chest Imaging- films reviewed: CT Chest wo contrast 08/23/20 - new trace bilateral pleural effusions and septal thickening. Decrease and resolution of pulmonary nodules.   CT Chest wo contrast 05/25/20 - near resolution of the RUL 47m nodule. Nodular opacity of medial RUL is now 665mfrom 1356mThe opacity of the medial RLL is now 1.8x1.0cm from 2.0x1.1 and the LLL opacity is 1.9x1.6cm from 3.7x3.7cm.  CT chest wo contrast 03/25/2020 - multifocal areas of nodularity and consolidative changes. Findings consistent with atypical infection or possible malignancy.  CT high-resolution 10/24/2018-mild bronchiectasis right middle lobe, lingula, right lower lobe, but does taper more appropriately distally.  Persistent nodules in right middle lobe and lingula with mild tree-in-bud opacities.  No inter or intralobular septal thickening or significant groundglass.  Expiratory images with some mosaicism  and significant bronchial and tracheal collapse, especially on the right.    HRCT chest 08/30/19: RUL TIB opacities, multilobar bronchiectasis. Tracheobronchomalacia progressed since previous CT scan. Subacute rib fractures- healing. CAD.  PFT: PFT Results Latest Ref Rng & Units 08/20/2019 09/18/2018 06/19/2017 08/08/2015 04/29/2015  FVC-Pre L 1.72 1.74 2.00 2.08 2.24  FVC-Predicted Pre % 66 66 74 75 81  FVC-Post L 1.65 1.61 1.86 2.17 2.25  FVC-Predicted Post % 63 61 69 78 81  Pre FEV1/FVC % % 90 88 90 83 87  Post FEV1/FCV % % 90 91 93 87 84  FEV1-Pre L 1.55 1.53 1.80 1.73 1.95  FEV1-Predicted Pre % 79 76 88 82 92  FEV1-Post L 1.49 1.47 1.72 1.89 1.89  DLCO uncorrected ml/min/mmHg 11.33 13.51 13.68 12.36 12.99  DLCO UNC% % 64 77 66 60 63  DLCO corrected ml/min/mmHg 11.33 - - 13.76 12.87  DLCO COR %Predicted % 64 - - 66 62  DLVA Predicted % 83 92 85 94 81  TLC L 3.71 3.52 3.95 - 3.91  TLC % Predicted % 80 76 85 - 84  RV % Predicted % 76 79 86 - 83   2020-no significant obstruction, mild restriction, mild DLCO reduction.  Flow volume loops consistent with restriction.   2021-no obstruction or bronchodilator reversibility.  No significant restriction.  Mild diffusion impairment.  Compared to 2020, mild improvement.   Previous respiratory cultures- all fungal and AFB negative from right middle lobe and lingula during bronchoscopy 08/2016, only positive for normal flora  BAL 04/05/20 - Candida Albicans    Pathology 08/23/2016: BAL lingula-no AFB or fungus   Echocardiogram: Unable to review report from 02/2018.  08/05/2017 demonstrates LVEF 65 to 70%34%rade 1 diastolic dysfunction.  Normal LA, RV, RA.  Trivial TR and MR, otherwise normal valves.   Echocardiogram 07/10/2019-LVEF greater than 75% with hyperdynamic function, moderate LVH with  grade 1 diastolic dysfunction.  Normal LA, RV, RA.  Trivial AR, otherwise normal valves.  Allergy panel 04/02/2016-increased IgE for Aspergillus, otherwise  negative Alpha-1 antitrypsin 170   Bronchoscopy 12/24/2018 (performed at [facility]) Neutrophils 12% Lymphocytes 1% Macrophages 79% Respiratory viral panel negative Unable to see PJP DNA Respiratory culture-1+ mixed gram-negative rods, normal flora Fungus culture negative AFB negative   08/25/19 sputum- Pan-sensitive Pseudomonas   Barium Swallow 02/15/2015  1. Small type 1 transient hiatal hernia. 2. Incomplete epiglottic inversion during swallowing, with laryngeal penetration of thick liquid to the level of the vocal cords. 3. No stricture or dysmotility identified. 4. 4 mm anterolisthesis at C4-5.  Assessment & Plan:   Bronchiectasis without complication ([place]) - Plan: budesonide (PULMICORT) 0.5 MG/2ML nebulizer solution  Tracheobronchomalacia  Discussion: Kenzlee Fishburn is a 70 year old woman, never smoker with asthma, tracheobronchomalacia on CPAP with oxygen, bronchiectasis and rheumatoid arthritis on rituximab and 42m prednisone who returns to pulmonary clinic for follow up.   The nodules and opacities on her CT scans have significantly improved/resolved from 03/2020 to the recent scan on 08/2020 likely representing an infectious or inflammatory etiology. No further follow up of the pulmonary nodules at this time.  She continues to have increased cough, sputum production and wheezing. She has noted incomplete epiglottic inversion during swallowing with laryngeal penetration of liquid to the level of the vocal cords which is the likely explanation of her cough with eating and drinking.   She has ongoing cough and wheezing which could be attributed to her tracheobronchomalacia and history of asthma. She is to continue stiolto. We have been trying to avoid inhaled corticosteroids due to her history of laryngeal/esophageal candidiasis, but she is requesting treatment of her increased wheezing. We will try budesonide nebulizer treatments as needed and she is to rinse out her mouth  after each use. She is to also use nystatin swish as needed as well.  Follow up in 4 months.   JFreda Jackson MD [facility] Office: [phone]  Current Outpatient Medications:    albuterol (PROVENTIL) (2.5 MG/3ML) 0.083% nebulizer solution, Take 3 mLs (2.5 mg total) by nebulization every 4 (four) hours as needed for wheezing or shortness of breath., Disp: 75 mL, Rfl: 12   allopurinol (ZYLOPRIM) 100 MG tablet, Take 100 mg by mouth daily., Disp: , Rfl:    atorvastatin (LIPITOR) 80 MG tablet, Take 1 tablet (80 mg total) by mouth daily., Disp: , Rfl:    azelastine (ASTELIN) 0.1 % nasal spray, Use in each nostril as directed, Disp: 30 mL, Rfl: 5   benzonatate (TESSALON) 200 MG capsule, Take 1 capsule (200 mg total) by mouth 3 (three) times daily as needed for cough., Disp: 30 capsule, Rfl: 0   Blood Glucose Monitoring Suppl (BLOOD GLUCOSE SYSTEM PAK) KIT, Please dispense per patient and insurance preference. Use as directed to monitor FSBS 4x . Dx: E11.65, Disp: 1 kit, Rfl: 0   budesonide (PULMICORT) 0.5 MG/2ML nebulizer solution, Take 2 mLs (0.5 mg total) by nebulization 2 (two) times daily as needed., Disp: 120 mL, Rfl: 12   cetirizine (ZYRTEC) 10 MG tablet, Take 1 tablet (10 mg total) by mouth daily., Disp: 30 tablet, Rfl: 5   cholecalciferol (VITAMIN D) 25 MCG (1000 UT) tablet, Take 1,000 Units by mouth daily. , Disp: , Rfl:    Continuous Blood Gluc Receiver (FREESTYLE LIBRE 14 DAY READER) DEVI, Apply 1 each topically daily as needed. USE AS DIRECTED TO MONITOR BLOOD GLUCOSE  LEVELS. DX: E11.9., Disp: 1 each, Rfl: 1   Continuous Blood Gluc Sensor (FREESTYLE LIBRE 14 DAY SENSOR) MISC, Apply 1 each topically every 14 (fourteen) days. USE AS DIRECTED TO MONITOR BLOOD GLUCOSE LEVELS. DX: E11.9., Disp: 2 each, Rfl: 11   DEXILANT 60 MG capsule, TAKE 1 CAPSULE BY MOUTH EACH MORNING, Disp: 90 capsule, Rfl: 3   diclofenac Sodium (VOLTAREN) 1 % GEL, Apply topically 4 (four)  times daily., Disp: , Rfl:    EPINEPHrine (EPIPEN 2-PAK) 0.3 mg/0.3 mL IJ SOAJ injection, Inject 0.3 mLs (0.3 mg total) into the muscle Once PRN., Disp: 0.3 mL, Rfl: 2   famotidine (PEPCID) 20 MG tablet, Take 20 mg by mouth at bedtime., Disp: , Rfl:    fluticasone (FLONASE) 50 MCG/ACT nasal spray, USE 2 SPRAYS IN EACH NOSTRIL DAILY, Disp: 16 g, Rfl: 2   folic acid (FOLVITE) 1 MG tablet, Take 1 mg by mouth daily., Disp: , Rfl:    furosemide (LASIX) 40 MG tablet, Take 1 tablet (40 mg total) by mouth daily., Disp: 30 tablet, Rfl: 1   glipiZIDE (GLIPIZIDE XL) 5 MG 24 hr tablet, Take 1 tablet (5 mg total) by mouth daily with breakfast., Disp: 30 tablet, Rfl: 3   Glucose Blood (BLOOD GLUCOSE TEST STRIPS) STRP, Please dispense per patient and insurance preference. Use as directed to monitor FSBS 4x . Dx: E11.65, Disp: 150 strip, Rfl: 1   guaiFENesin (MUCINEX) 600 MG 12 hr tablet, Take 1 tablet (600 mg total) by mouth 2 (two) times daily as needed for cough or to loosen phlegm., Disp: 60 tablet, Rfl: 1   isosorbide-hydrALAZINE (BIDIL) 20-37.5 MG tablet, Take 1 tablet by mouth 2 (two) times daily., Disp: 60 tablet, Rfl: 3   magnesium oxide (MAG-OX) 400 MG tablet, Take 400 mg by mouth daily., Disp: , Rfl:    montelukast (SINGULAIR) 10 MG tablet, Take 1 tablet (10 mg total) by mouth daily., Disp: 30 tablet, Rfl: 5   predniSONE (DELTASONE) 1 MG tablet, Take 2 tablets (2 mg total) by mouth daily with breakfast. Take with additional 79m tablet to equal 152mdaily., Disp: 60 tablet, Rfl: 3   predniSONE (DELTASONE) 10 MG tablet, Take 1 tablet (10 mg total) by mouth daily with breakfast. Take with additional 2 tabs of 58m55mrednisone to equal 6m50mily., Disp: 30 tablet, Rfl: 3   Propylene Glycol (SYSTANE COMPLETE) 0.6 % SOLN, Place 1 drop into both eyes 2 (two) times daily., Disp: , Rfl:    Tiotropium Bromide-Olodaterol (STIOLTO RESPIMAT) 2.5-2.5 MCG/ACT AERS, Inhale 2 puffs into the lungs daily., Disp: 4 g, Rfl:  5   traMADol (ULTRAM) 50 MG tablet, Take 50 mg by mouth at bedtime as needed., Disp: , Rfl:    XARELTO 20 MG TABS tablet, TAKE 1 TABLET BY MOUTH DAILY WITH SUPPER, Disp: 90 tablet, Rfl: 3  Current Facility-Administered Medications:    denosumab (PROLIA) injection 60 mg, 60 mg, Subcutaneous, Q6 months, PickSusy Scott, 60 mg at 03/31/20 1224

## 2020-11-17 ENCOUNTER — Telehealth: Payer: Self-pay | Admitting: Family Medicine

## 2020-11-17 ENCOUNTER — Encounter: Payer: Self-pay | Admitting: Pulmonary Disease

## 2020-11-17 DIAGNOSIS — F32A Depression, unspecified: Secondary | ICD-10-CM | POA: Diagnosis not present

## 2020-11-17 DIAGNOSIS — I5042 Chronic combined systolic (congestive) and diastolic (congestive) heart failure: Secondary | ICD-10-CM | POA: Diagnosis not present

## 2020-11-17 DIAGNOSIS — K219 Gastro-esophageal reflux disease without esophagitis: Secondary | ICD-10-CM | POA: Diagnosis not present

## 2020-11-17 DIAGNOSIS — E1151 Type 2 diabetes mellitus with diabetic peripheral angiopathy without gangrene: Secondary | ICD-10-CM | POA: Diagnosis not present

## 2020-11-17 DIAGNOSIS — E1122 Type 2 diabetes mellitus with diabetic chronic kidney disease: Secondary | ICD-10-CM | POA: Diagnosis not present

## 2020-11-17 DIAGNOSIS — M71121 Other infective bursitis, right elbow: Secondary | ICD-10-CM | POA: Diagnosis not present

## 2020-11-17 DIAGNOSIS — M069 Rheumatoid arthritis, unspecified: Secondary | ICD-10-CM | POA: Diagnosis not present

## 2020-11-17 DIAGNOSIS — J4550 Severe persistent asthma, uncomplicated: Secondary | ICD-10-CM | POA: Diagnosis not present

## 2020-11-17 DIAGNOSIS — N1832 Chronic kidney disease, stage 3b: Secondary | ICD-10-CM | POA: Diagnosis not present

## 2020-11-17 NOTE — Telephone Encounter (Signed)
Patient left voicemail message to follow up on precert status for refill of isosorbide-hydrALAZINE (BIDIL) 20-37.5 MG tablet;  patient almost out of medication.  Please advise at [phone].

## 2020-11-17 NOTE — Telephone Encounter (Signed)
Informed patient refill was sent to pharmacy on 11/09/20.

## 2020-11-18 DIAGNOSIS — E1122 Type 2 diabetes mellitus with diabetic chronic kidney disease: Secondary | ICD-10-CM | POA: Diagnosis not present

## 2020-11-18 DIAGNOSIS — F32A Depression, unspecified: Secondary | ICD-10-CM | POA: Diagnosis not present

## 2020-11-18 DIAGNOSIS — M71121 Other infective bursitis, right elbow: Secondary | ICD-10-CM | POA: Diagnosis not present

## 2020-11-18 DIAGNOSIS — M069 Rheumatoid arthritis, unspecified: Secondary | ICD-10-CM | POA: Diagnosis not present

## 2020-11-18 DIAGNOSIS — E1151 Type 2 diabetes mellitus with diabetic peripheral angiopathy without gangrene: Secondary | ICD-10-CM | POA: Diagnosis not present

## 2020-11-18 DIAGNOSIS — I5042 Chronic combined systolic (congestive) and diastolic (congestive) heart failure: Secondary | ICD-10-CM | POA: Diagnosis not present

## 2020-11-18 DIAGNOSIS — K219 Gastro-esophageal reflux disease without esophagitis: Secondary | ICD-10-CM | POA: Diagnosis not present

## 2020-11-18 DIAGNOSIS — J4550 Severe persistent asthma, uncomplicated: Secondary | ICD-10-CM | POA: Diagnosis not present

## 2020-11-18 DIAGNOSIS — N1832 Chronic kidney disease, stage 3b: Secondary | ICD-10-CM | POA: Diagnosis not present

## 2020-11-20 IMAGING — CT NUCLEAR MEDICINE PET IMAGE INITIAL (PI) SKULL BASE TO THIGH
1 of 8 series · 3 of 16 positions shown, 4 images · non-contrast
Comparison: CT chest, abdomen and pelvis 02/10/2018.

CLINICAL DATA: Initial treatment strategy for breast cancer.

EXAM:
NUCLEAR MEDICINE PET SKULL BASE TO THIGH
TECHNIQUE: 6.2 mCi F-18 FDG was injected intravenously. Full-ring PET imaging
was performed from the skull base to thigh after the radiotracer. CT
data was obtained and used for attenuation correction and anatomic
localization.
Fasting blood glucose: 84 mg/dl

[Series 4: ct sk_thigh 5.0 b31f · axial · 0.98mm/px · z∈[-783,+45]mm · 3 of 208 slices shown, 4 images]
[im 1/208  soft-tissue]
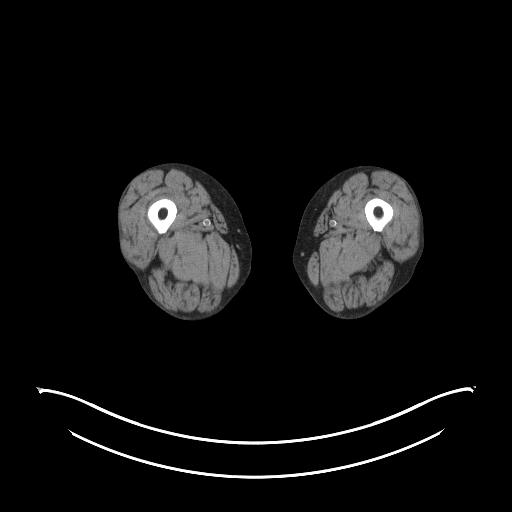
[im 1/208  bone]
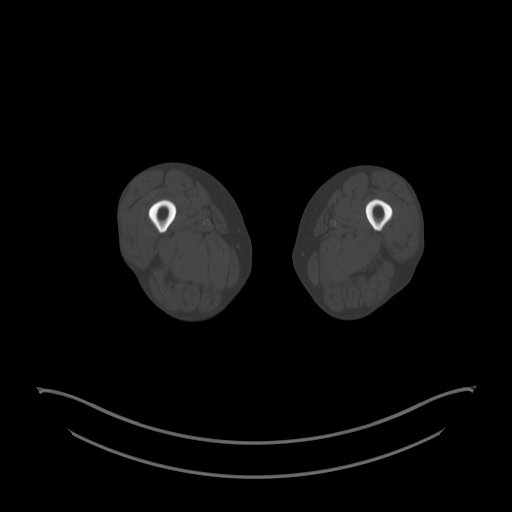
[im 104/208  soft-tissue]
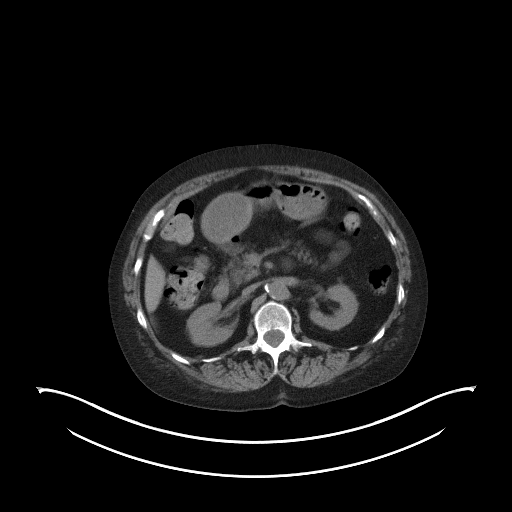
[im 208/208  soft-tissue]
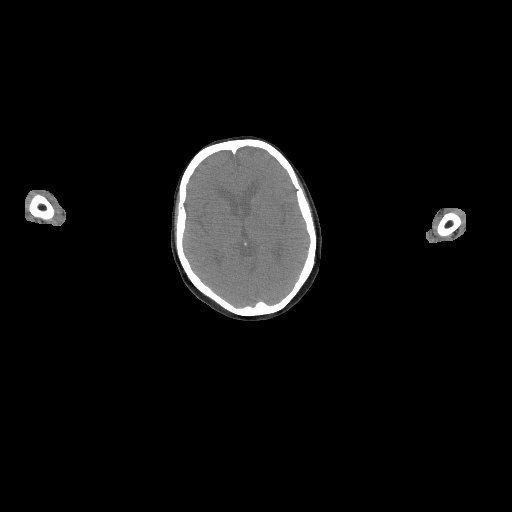

[3 of 16 positions shown; findings below may reference images not displayed]

FINDINGS: Mediastinal blood pool activity: SUV max

NECK:

No hypermetabolic cervical lymph nodes are identified.There are no
lesions of the pharyngeal mucosal space.

Incidental CT findings: Mild carotid atherosclerosis.

CHEST:

There are no hypermetabolic mediastinal, hilar or axillary lymph
nodes. No suspicious pulmonary activity or nodularity. No abnormal
breast activity.

Incidental CT findings: Atherosclerosis of the aorta, great vessels
and coronary arteries. Stable postsurgical changes laterally in the
left breast.

ABDOMEN/PELVIS:

There is no hypermetabolic activity within the liver, adrenal
glands, spleen or pancreas. There is no hypermetabolic nodal
activity.

Incidental CT findings: Previous cholecystectomy and hysterectomy.
Aortic and branch vessel atherosclerosis.

SKELETON:

There is no hypermetabolic activity to suggest osseous metastatic
disease.

Incidental CT findings: none
IMPRESSION: 1. No evidence of locally recurrent or metastatic breast cancer.
2. No acute findings.
3. Coronary and Aortic Atherosclerosis ([id number].N).

## 2020-11-21 NOTE — Telephone Encounter (Signed)
Patient called to inform provider Rx for isosorbide-hydrALAZINE (BIDIL) 20-37.5 MG tablet [[id number]]  still being rejected by pharmacy; possibly needs prior auth. Patient still waiting for follow up. Please advise at [phone].

## 2020-11-22 NOTE — Telephone Encounter (Signed)
Received request from pharmacy for PA on   PA submitted.   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   PA- [id number] approved 02/20/2020- 02/18/2022.  Pharmacy made aware.

## 2020-11-23 DIAGNOSIS — M069 Rheumatoid arthritis, unspecified: Secondary | ICD-10-CM | POA: Diagnosis not present

## 2020-11-23 DIAGNOSIS — M71121 Other infective bursitis, right elbow: Secondary | ICD-10-CM | POA: Diagnosis not present

## 2020-11-23 DIAGNOSIS — F32A Depression, unspecified: Secondary | ICD-10-CM | POA: Diagnosis not present

## 2020-11-23 DIAGNOSIS — E1151 Type 2 diabetes mellitus with diabetic peripheral angiopathy without gangrene: Secondary | ICD-10-CM | POA: Diagnosis not present

## 2020-11-23 DIAGNOSIS — I5042 Chronic combined systolic (congestive) and diastolic (congestive) heart failure: Secondary | ICD-10-CM | POA: Diagnosis not present

## 2020-11-23 DIAGNOSIS — J4550 Severe persistent asthma, uncomplicated: Secondary | ICD-10-CM | POA: Diagnosis not present

## 2020-11-23 DIAGNOSIS — K219 Gastro-esophageal reflux disease without esophagitis: Secondary | ICD-10-CM | POA: Diagnosis not present

## 2020-11-23 DIAGNOSIS — N1832 Chronic kidney disease, stage 3b: Secondary | ICD-10-CM | POA: Diagnosis not present

## 2020-11-23 DIAGNOSIS — E1122 Type 2 diabetes mellitus with diabetic chronic kidney disease: Secondary | ICD-10-CM | POA: Diagnosis not present

## 2020-11-27 NOTE — Progress Notes (Signed)
Office Visit Note  Patient: Ebony Scott             Date of Birth: November 24, 1950           MRN: [id number]             PCP: [name], MD Referring: [name], MD Visit Date: 11/28/2020  Subjective:  New Patient (Initial Visit) (Transfer from Dr. Kathlene November of [place])   History of Present Illness: Ebony Scott is a 70 y.o. female here for iatrogenic cushing syndrome and history of rheumatoid arthritis with long term prednisone treatment, previously seen by Dr. Kathlene November at [facility] for this problem. Mostly had been taking treatments after hospitalization with severe joint inflammation in 2020. She has tried taking numerous DMARD treatments for years including actemra, simponi, abatacept, and rituximab so far without great response or tolerance. Chronic prednisone required for symptoms throughout this time but has taken these longstanding for asthma disease already. She has had some issue with recurrent infections with pulmonary and septic bursitis and more recently noninfectious allergy symptoms managed with Dr. Neldon Mc and Erin Fulling. She started on IVIG treatments with Dr. Neldon Mc for this identified recently, not sure if primary or related to previous medication  DMARD Hx Rituximab Abatacept Simponi Actemra IV  Activities of Daily Living:  Patient reports morning stiffness for 24 hours.   Patient Reports nocturnal pain.  Difficulty dressing/grooming: Reports Difficulty climbing stairs: Reports Difficulty getting out of chair: Reports Difficulty using hands for taps, buttons, cutlery, and/or writing: Reports  Review of Systems  Constitutional:  Positive for fatigue.  HENT:  Positive for mouth dryness.   Eyes:  Positive for dryness.  Respiratory:  Positive for shortness of breath.   Cardiovascular:  Positive for swelling in legs/feet.  Gastrointestinal:  Negative for constipation.  Endocrine: Positive for cold intolerance, heat intolerance, excessive thirst and  increased urination.  Genitourinary:  Negative for decreased urine output.  Musculoskeletal:  Positive for joint pain, gait problem, joint pain, joint swelling, muscle weakness, morning stiffness and muscle tenderness.  Skin:  Negative for rash.  Allergic/Immunologic: Positive for susceptible to infections.  Neurological:  Positive for numbness and weakness.  Hematological:  Positive for bruising/bleeding tendency.  Psychiatric/Behavioral:  Positive for sleep disturbance.    PMFS History:  Patient Active Problem List   Diagnosis Date Noted   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[place])  01/11/2021   Coronary artery calcification seen on CAT scan 01/11/2021   ARF (acute renal failure) ([place]) 10/04/2020   Physical deconditioning 09/25/2020   History of DVT (deep vein thrombosis) 09/24/2020   Osteomyelitis of second toe of right foot ([place]) 09/23/2020   DMII (diabetes mellitus, type 2) ([place]) 09/23/2020   Cellulitis of right hand 09/22/2020   Septic olecranon bursitis of right elbow 09/22/2020   Abscess of dorsum of right hand 09/22/2020   Hoarseness 26/71/2458   Metabolic encephalopathy 09/98/3382   Acute encephalopathy    AMS (altered mental status) 02/23/2020   S/P reverse total shoulder arthroplasty, right    Hypokalemia    DOE (dyspnea on exertion) 02/18/2020   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   S/P reverse total shoulder arthroplasty, left 02/16/2020   Preoperative clearance 01/26/2020   Closed displaced fracture of metatarsal bone of right foot 01/20/2019   History of pulmonary embolus (PE) 12/19/2018   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   Rheumatoid arthritis ([place])    Bilateral impacted cerumen 08/18/2018   Fungal otitis externa 08/18/2018   Asthmatic bronchitis with exacerbation 07/29/2018  Pulmonary embolism ([place]) 07/29/2018   Cellulitis of forearm    Fever of unknown origin (FUO) 02/10/2018   Abdominal pain 02/10/2018   Fracture of base of fifth metatarsal bone  with routine healing, left    Closed fracture of fifth metatarsal bone of right foot, initial encounter 01/15/2018   DVT (deep venous thrombosis) ([place]) 01/14/2018   Hypothyroidism 01/14/2018   Depression 01/14/2018   Debility    Malnutrition of moderate degree 12/17/2017   Knee pain    History of breast cancer    Chronic pain syndrome    Fibromyalgia    Graves disease    Vasculitis ([place]) 12/13/2017   Rhabdomyolysis 12/13/2017   Dysphonia 08/10/2017   Pulmonary nodules    Chronic kidney disease, stage 3b ([place]) 04/15/2016   Hx of adenomatous colonic polyps 04/12/2016   Cough 02/09/2016   Opacity of lung on imaging study 11/03/2015   Severe persistent asthma 02/08/2015   Facial pain syndrome 02/08/2015   Insomnia 02/08/2015   Other allergic rhinitis 02/08/2015   LPRD (laryngopharyngeal reflux disease) 02/08/2015   Chronic combined systolic and diastolic CHF (congestive heart failure) ([place]) 02/08/2015   Benign paroxysmal positional vertigo 12/03/2012   Dizziness and giddiness 10/29/2012   Disequilibrium 10/29/2012   Pseudogout 02/24/2012   Pneumonia 05/31/2011   Irritable bowel syndrome 01/02/2010   Hyperlipidemia 12/23/2009   Hyperthyroidism 11/12/2006   Seasonal and perennial allergic rhinitis 11/12/2006   GERD 11/12/2006   NECK PAIN, CHRONIC 11/12/2006   OSTEOPOROSIS 11/12/2006   BREAST CANCER, HX OF 11/12/2006    Past Medical History:  Diagnosis Date   Abscess of dorsum of right hand 09/22/2020   Acute deep vein thrombosis (DVT) of right lower extremity ([place]) 12/13/2017   Allergic rhinitis    Anemia    Angio-edema    Anxiety    pt denies   Arthritis    Phreesia 10/21/2019   Asthma    Phreesia 10/21/2019   Breast cancer ([place]) 1998   in remission   Cataract    REMOVED   Clostridium difficile colitis 01/14/2018   Clostridium difficile diarrhea 01/60/1093   Complication of anesthesia    "had hard time waking up from it several times" (02/20/2012)   Depression     "some; don't take anything for it" (02/20/2012)   Diverticulosis    DVT (deep venous thrombosis) ([place])    Fibromyalgia 11/2011   GERD (gastroesophageal reflux disease)    Graves disease    Headache(784.0)    "related to allergies; more at different times during the year" (02/20/2012)   Hemorrhoids    Hiatal hernia    back and neck   Hx of adenomatous colonic polyps 04/12/2016   Hypercholesteremia    good cholesterol is high   Hypothyroidism    IBS (irritable bowel syndrome)    Moderate persistent asthma    -FeV1 72% 2011, -IgE 102 2011, CT sinus Neg 2011   Osteoporosis    on reclast yearly   Peripheral vascular disease ([place]) 2019   DVTs   Pneumonia 04/2011; ~ 11/2011   "double; single" (02/20/2012)   Septic olecranon bursitis of right elbow 09/22/2020   Seronegative rheumatoid arthritis ([place])    Dr. Lahoma Rocker   SIRS (systemic inflammatory response syndrome) ([place]) 02/10/2018   Tracheobronchomalacia     Family History  Problem Relation Age of Onset   Allergies Mother    Heart disease Mother    Arthritis Mother    Lung cancer Mother    Diabetes Mother  Allergies Father    Heart disease Father    Arthritis Father    Stroke Father    Diabetes Maternal Grandfather    Colitis Daughter    Colon cancer Other        Maternal half aunt/Maternal half uncle   Past Surgical History:  Procedure Laterality Date   ABDOMINAL HYSTERECTOMY N/A    Phreesia 10/21/2019   ANTERIOR AND POSTERIOR REPAIR  1990's   APPENDECTOMY     BREAST LUMPECTOMY  1998   left   BREAST SURGERY N/A    Phreesia 10/21/2019   BRONCHIAL WASHINGS  04/05/2020   Procedure: BRONCHIAL WASHINGS;  Surgeon: Freddi Starr, MD;  Location: [facility];  Service: Pulmonary;;   CARPOMETACARPEL ([place]) FUSION OF THUMB WITH AUTOGRAFT FROM RADIUS  ~ 2009   "both thumbs" (02/20/2012)   CATARACT EXTRACTION W/ INTRAOCULAR LENS  IMPLANT, BILATERAL  2012   CERVICAL DISCECTOMY  10/2001   C5-C6   CERVICAL FUSION  2003    C3-C4   CHOLECYSTECTOMY     COLONOSCOPY     CORONARY STENT INTERVENTION N/A 01/11/2021   Procedure: CORONARY STENT INTERVENTION;  Surgeon: Leonie Man, MD;  Location: [place] [facility];  Service: Cardiovascular;  Laterality: N/A;   DEBRIDEMENT TENNIS ELBOW  ?1970's   right   ESOPHAGOGASTRODUODENOSCOPY     EYE SURGERY N/A    Phreesia 10/21/2019   HYSTERECTOMY     I & D EXTREMITY Right 09/22/2020   Procedure: IRRIGATION AND DEBRIDEMENT RIGHT HAND AND ELBOW;  Surgeon: Marchia Bond, MD;  Location: [facility];  Service: Orthopedics;  Laterality: Right;   KNEE ARTHROPLASTY  ?1990's   "?right; w/cartilage repair" (02/20/2012)   NASAL SEPTUM SURGERY  1980's   POSTERIOR CERVICAL FUSION/FORAMINOTOMY  2004   "failed initial fusion; rewired  anterior neck" (02/20/2012)   REVERSE SHOULDER ARTHROPLASTY Right 02/16/2020   Procedure: REVERSE SHOULDER ARTHROPLASTY;  Surgeon: Marchia Bond, MD;  Location: [facility];  Service: Orthopedics;  Laterality: Right;   RIGHT/LEFT HEART CATH AND CORONARY ANGIOGRAPHY N/A 01/11/2021   Procedure: RIGHT/LEFT HEART CATH AND CORONARY ANGIOGRAPHY;  Surgeon: Leonie Man, MD;  Location: [place] [facility];  Service: Cardiovascular;  Laterality: N/A;   SPINE SURGERY N/A    Phreesia 10/21/2019   TONSILLECTOMY  ~ 1953   VESICOVAGINAL FISTULA CLOSURE W/ TAH  1988   VIDEO BRONCHOSCOPY Bilateral 08/23/2016   Procedure: VIDEO BRONCHOSCOPY WITH FLUORO;  Surgeon: Javier Glazier, MD;  [facility];  Service: Cardiopulmonary;  Laterality: Bilateral;   VIDEO BRONCHOSCOPY N/A 04/05/2020   Procedure: VIDEO BRONCHOSCOPY WITHOUT FLUORO;  Surgeon: Freddi Starr, MD;  [facility];  Service: Pulmonary;  Laterality: N/A;   Social History   Social History Narrative   Patient lives at home alone. Patient  divorced.    Patient has her BS degree.   Right handed.   Caffeine- sometimes coffee.      [facility]:   Born in [place], [place]. She worked as a  Copywriter, advertising. She has no pets currently. She does have indoor plants. Previously had mold in her home that was remediated. Carpet was removed.          Immunization History  Administered Date(s) Administered   DTaP 08/18/2013   Fluad Quad(high Dose 65+) 10/16/2018, 11/20/2019, 11/03/2020   Hepatitis A 09/04/2007, 03/02/2008   Hepatitis B 01/07/1985, 02/06/1985, 08/17/1985   Influenza Split 11/13/2010, 11/22/2011, 10/20/2012   Influenza Whole 11/14/2009, 11/21/2011   Influenza, High Dose Seasonal PF 11/09/2015,  11/27/2017   Influenza, Quadrivalent, Recombinant, Inj, Pf 11/20/2019   Influenza,inj,Quad PF,6+ Mos 11/06/2013, 11/09/2014, 11/06/2016   Meningococcal Conjugate 09/04/2007   PFIZER(Purple Top)SARS-COV-2 Vaccination 03/12/2019, 04/01/2019, 12/16/2019, 06/29/2020   Pneumococcal Conjugate-13 05/18/2013   Pneumococcal Polysaccharide-23 01/05/1994, 11/28/2011, 05/27/2017   Td 07/24/1995, 03/16/2005   Tdap 08/18/2013   Zoster Recombinat (Shingrix) 09/18/2017   Zoster, Live 03/02/2008     Objective: Vital Signs: BP (!) 106/43 (BP Location: Right Arm, Patient Position: Sitting, Cuff Size: Normal)   Pulse 86   Resp 20   Ht _0  (1.549 m)   Wt 128 lb 3.2 oz (58.2 kg)   BMI 24.22 kg/m    Physical Exam Cardiovascular:     Rate and Rhythm: Regular rhythm. Tachycardia present.  Pulmonary:     Effort: Pulmonary effort is normal.     Comments: Faint expiratory wheezes Musculoskeletal:     Comments: 2+ pitting edema in bilateral distal legs and feet, mild overlying erythema and warmth at shins, no ulcers  Skin:    General: Skin is warm and dry.     Comments: Friable skin, scattered ecchymoses, longitudinal nail ridging with some splitting in thumbs  Neurological:     Mental Status: She is alert.     Deep Tendon Reflexes: Reflexes normal.  Psychiatric:        Mood and Affect: Mood normal.     Musculoskeletal Exam:  Neck no tenderness, well healed posterior surgical  scar Shoulders full ROM no tenderness or swelling Elbows full ROM no tenderness or swelling Wrists slightly decreased flexion and extension motion no palpable swelling, chornic hypertrophy over MCP joints of bilateral hands with probable pannus at right 3rd mcp, heberdon's nodes extensive in IP joints b/l Knees full ROM tenderness to palpation of joint line, patellofemoral crepitus bilaterally, left knee small effusion Ankles full ROM no tenderness or swelling   CDAI Exam: CDAI Score: 14  Patient Global: 60 mm; Provider Global: 30 mm Swollen: 1 ; Tender: 4  Joint Exam 11/28/2020      Right  Left  MCP 2   Tender     MCP 3   Tender     Knee   Tender  Swollen Tender     Investigation: No additional findings.  Imaging: CARDIAC CATHETERIZATION  Result Date: 01/11/2021   Prox LAD lesion is 95% stenosed proximal to 1st Diag &  Mid LAD lesion is 90% stenosed following   Mid LAD lesion is 90% stenosed.   A drug-eluting stent was successfully placed covering both lesions using a STENT ONYX FRONTIER 2.25X34.   Post intervention, there is a 0% residual stenosis.   --------------------------------------------------------------   Hemodynamic findings consistent with moderate pulmonary hypertension.   There is no aortic valve stenosis. SUMMARY Severe single-vessel CAD with 95% and 90% stenoses of the proximal and mid LAD at major 1st Diag branch.  Likely be culprit for reduced EF and anterior hypokinesis on echo. Successful DES PCI of the LAD using an Onyx Frontier DES 2.25 mm x 34 mm postdilated in tapered fashion from 2.6 to 2.67m.  TIMI III flow pre and post, reduced to 0% Otherwise minimal CAD in the Right dominant system. 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 Moderate to severely reduced cardiac function with Cardiac Output-Index of 4.05-2.59 by Fick and  3.75-2.40 by Thermal Dilution. RECOMMENDATIONS Continue to titrate medical management of chronic Systolic and  diastolic heart failure.  She was given 40 mg IV Lasix on the Cath Lab table.  Recommend that she takes 40 mg twice daily oral Lasix for 3 days postprocedure.  She will need close follow-up with either primary cardiologist or APP to initiate titration of medications. Clinically stable for d/c today Glenetta Hew, MD   Recent Labs: Lab Results  Component Value Date   WBC 17.3 (H) 01/23/2021   HGB 8.3 (L) 01/23/2021   PLT 238 01/23/2021   NA 142 01/23/2021   K 4.9 01/23/2021   CL 103 01/23/2021   CO2 28 01/23/2021   GLUCOSE 102 (H) 01/23/2021   BUN 42 (H) 01/23/2021   CREATININE 1.44 (H) 01/23/2021   BILITOT 0.3 12/21/2020   ALKPHOS 117 12/21/2020   AST 52 (H) 12/21/2020   ALT 55 (H) 12/21/2020   PROT 6.0 12/21/2020   ALBUMIN 3.8 12/21/2020   CALCIUM 9.3 01/23/2021   GFRAA 40 (L) 08/25/2020    Speciality Comments: No specialty comments available.  Procedures:  No procedures performed Allergies: Baclofen, Dust mite extract, Molds & smuts, Morphine and related, Other, Penicillins, Rofecoxib, Shellfish allergy, Tetracycline hcl, Xolair [omalizumab], Zoledronic acid, Dilaudid [hydromorphone hcl], Levofloxacin, Oxycodone hcl, Paroxetine, Celecoxib, Diltiazem hcl, Hydrocodone-acetaminophen, Lactose intolerance (gi), Morphine sulfate, Oxycodone-acetaminophen, Rituximab, Tree extract, and Penicillin g procaine   Assessment / Plan:     Visit Diagnoses: Rheumatoid arthritis with negative rheumatoid factor, involving unspecified site ([place]) - Plan: Sedimentation rate, C-reactive protein, COMPLETE METABOLIC PANEL WITH GFR  Rheumatoid arthritis currently only swollen in the knee that has multiple other tender joint areas.  She is on 12 mg daily prednisone which appears to be controlling most of the inflammation otherwise.  We will check ESR and CRP for disease activity assessment.  She is  on Prolia for treatment of her osteopenia with high fracture risk and long-term use of glucocorticoids.  Recommend trial of slowly tapering down total prednisone dose by 1 mg daily dose per month no new DMARDs treatment at this time.  Chronic combined systolic and diastolic CHF (congestive heart failure) (HCC)  Currently not in severe exacerbation although there is 2+ pitting edema in bilateral lower extremities mild airway wheezing.  Chronic kidney disease, stage 3b ([place]) - Plan: COMPLETE METABOLIC PANEL WITH GFR  Chronic renal disease with pedal edema on multiple medications risk of fluid retention or electrolyte wasting on chronic steroid treatment we will recheck CMP today.  Fibromyalgia  Previous diagnosis of fibromyalgia with generalized pain and fatigue symptoms out of proportion to inflammatory changes and osteoarthritis changes in prior rheumatology clinic.  Does not appear to be in any particular exacerbation though does have a lot of generalized symptoms related to her medical problems and relative deconditioning.  Orders: Orders Placed This Encounter  Procedures   Sedimentation rate   C-reactive protein   COMPLETE METABOLIC PANEL WITH GFR    No orders of the defined types were placed in this encounter.    Follow-Up Instructions: Return in about 2 months (around 01/28/2021) for New pt RA/CVID on prednisone f/u 80mo.   CCollier Salina MD  Note - This record has been created using [employer]  Chart creation errors have been sought, but may not always  have been located. Such creation errors do not reflect on  the standard of medical care.

## 2020-11-28 ENCOUNTER — Encounter: Payer: Self-pay | Admitting: Internal Medicine

## 2020-11-28 ENCOUNTER — Telehealth: Payer: Self-pay | Admitting: Family Medicine

## 2020-11-28 ENCOUNTER — Ambulatory Visit (INDEPENDENT_AMBULATORY_CARE_PROVIDER_SITE_OTHER): Payer: Medicare PPO | Admitting: Internal Medicine

## 2020-11-28 ENCOUNTER — Other Ambulatory Visit: Payer: Self-pay

## 2020-11-28 VITALS — BP 106/43 | HR 86 | Resp 20 | Ht 61.0 in | Wt 128.2 lb

## 2020-11-28 DIAGNOSIS — N1832 Chronic kidney disease, stage 3b: Secondary | ICD-10-CM

## 2020-11-28 DIAGNOSIS — I5042 Chronic combined systolic (congestive) and diastolic (congestive) heart failure: Secondary | ICD-10-CM

## 2020-11-28 DIAGNOSIS — M0600 Rheumatoid arthritis without rheumatoid factor, unspecified site: Secondary | ICD-10-CM | POA: Diagnosis not present

## 2020-11-28 DIAGNOSIS — M797 Fibromyalgia: Secondary | ICD-10-CM

## 2020-11-28 MED ORDER — GLIPIZIDE ER 5 MG PO TB24: 5.0000 mg | ORAL_TABLET | Freq: Every day | ORAL | 3 refills | Status: DC

## 2020-11-28 NOTE — Telephone Encounter (Signed)
Prescription sent to pharmacy.

## 2020-11-28 NOTE — Telephone Encounter (Signed)
Patient left voicemail message to follow up on Rx for managing blood sugar. Stated if provider wants her to continue taking glipiZIDE (GLIPIZIDE XL) 5 MG 24 hr tablet [[id number]] , refill needed.   Pharmacy confirmed as   [facility] - [place], [address] [place] [address]  Phone:  [phone]  Fax:  [phone]   Please advise at [phone]

## 2020-11-28 NOTE — Patient Instructions (Signed)
Erythrocyte Sedimentation Rate Test Why am I having this test? The erythrocyte sedimentation rate (ESR) test is used to help find illnesses related to:  Sudden (acute) or long-term (chronic) infections.  Inflammation.  The body's disease-fighting system attacking healthy cells (autoimmune diseases).  Cancer.  Tissue death. If you have symptoms that may be related to any of these illnesses, your health care provider may do an ESR test before doing more specific tests. If you have an inflammatory immune disease, such as rheumatoid arthritis, you may have this test to help monitor your therapy. What is being tested? This test measures how long it takes for your red blood cells (erythrocytes) to settle in a solution over a certain amount of time (sedimentation rate). When you have an infection or inflammation, your red blood cells clump together and settle faster. The sedimentation rate provides information about how much inflammation is present in the body. What kind of sample is taken? A blood sample is required for this test. It is usually collected by inserting a needle into a blood vessel.   How do I prepare for this test? Follow any instructions from your health care provider about changing or stopping your regular medicines. Tell a health care provider about:  Any allergies you have.  All medicines you are taking, including vitamins, herbs, eye drops, creams, and over-the-counter medicines.  Any blood disorders you have.  Any surgeries you have had.  Any medical conditions you have, such as thyroid or kidney disease.  Whether you are pregnant or may be pregnant. How are the results reported? Your results will be reported as a value that measures sedimentation rate in millimeters per hour (mm/hr). Your health care provider will compare your results to normal ranges that were established after testing a large group of people (reference values). Reference values may vary among  labs and hospitals. For this test, common reference values, which vary by age and gender, are:  Newborn: 0-2 mm/hr.  Child, up to puberty: 0-10 mm/hr.  Female: ? Under 50 years: 0-20 mm/hr. ? 50-85 years: 0-30 mm/hr. ? Over 85 years: 0-42 mm/hr.  Female: ? Under 50 years: 0-15 mm/hr. ? 50-85 years: 0-20 mm/hr. ? Over 85 years: 0-30 mm/hr. Certain conditions or medicines may cause ESR levels to be falsely lower or higher, such as:  Pregnancy.  Obesity.  Steroids, birth control pills, and blood thinners.  Thyroid or kidney disease. What do the results mean? Results that are within reference values are considered normal, meaning that the level of inflammation in your body is healthy. High ESR levels mean that there is inflammation in your body. You will have more tests to help make a diagnosis. Inflammation may result from many different conditions or injuries. Talk with your health care provider about what your results mean. Questions to ask your health care provider Ask your health care provider, or the department that is doing the test:  When will my results be ready?  How will I get my results?  What are my treatment options?  What other tests do I need?  What are my next steps? Summary  The erythrocyte sedimentation rate (ESR) test is used to help find illnesses associated with sudden (acute) or long-term (chronic) infections, inflammation, autoimmune diseases, cancer, or tissue death.  If you have symptoms that may be related to any of these illnesses, your health care provider may do an ESR test before doing more specific tests. If you have an inflammatory immune disease, such as rheumatoid   time (sedimentation rate). This provides information about how much inflammation is present in the body.  C-Reactive Protein Test Why am I having this test? The  C-reactive protein (CRP) is a substance that the liver releases in response to inflammation within the body. You may have a CRP test to help diagnose: Serious bacterial or fungal infections. Inflammatory diseases, such as inflammatory bowel disease. Lupus, rheumatoid arthritis, or other autoimmune diseases. What is being tested? This test checks for the level of CRP in your blood. The level of CRP in your body increases greatly after a heart attack, infection, or injury. What kind of sample is taken? A blood sample is required for this test. It is usually collected by inserting a needle into a blood vessel. Tell a health care provider about: All medicines you are taking, including vitamins, herbs, eye drops, creams, and over-the-counter medicines. Any medical conditions you have. The use of birth control pills or hormone replacement therapy such as estrogen. Any blood disorders you have. Whether you are pregnant or may be pregnant. How are the results reported? Your test results will be reported as a value that indicates how much CRP is in your blood. This will be reported as milligrams of CRP per liter (mg/L) of blood. Your health care provider will compare your results to normal ranges that were established after testing a large group of people (reference ranges). Reference ranges may vary among labs and hospitals. For this test, the standard CRP reference value is less than 10 mg/L. What do the results mean? A standard CRP test result that is less than 10 mg/L is considered normal, meaning that you do not have an abnormally high level of inflammation in your body. A standard CRP test result that is greater than 10 mg/L means that there is an abnormally high level of inflammation in your body that is causing CRP to be released. Inflammation may result from many different conditions or injuries. You will have more tests to help make a diagnosis. Talk with your health care provider about what  your results mean. Questions to ask your health care provider Ask your health care provider, or the department that is doing the test: When will my results be ready? How will I get my results? What are my treatment options? What other tests do I need? What are my next steps? Summary C-reactive protein (CPR) is a substance released by the liver in response to inflammation within the body. The CRP test may be performed to help diagnose infection and inflammatory conditions. Talk with your health care provider about what your results mean. This information is not intended to replace advice given to you by your health care provider. Make sure you discuss any questions you have with your health care provider. Document Revised: 12/02/2019 Document Reviewed: 12/02/2019 [employer]vier Patient Education  2022 [employer].

## 2020-11-29 DIAGNOSIS — E1151 Type 2 diabetes mellitus with diabetic peripheral angiopathy without gangrene: Secondary | ICD-10-CM | POA: Diagnosis not present

## 2020-11-29 DIAGNOSIS — M069 Rheumatoid arthritis, unspecified: Secondary | ICD-10-CM | POA: Diagnosis not present

## 2020-11-29 DIAGNOSIS — I5042 Chronic combined systolic (congestive) and diastolic (congestive) heart failure: Secondary | ICD-10-CM | POA: Diagnosis not present

## 2020-11-29 DIAGNOSIS — M71121 Other infective bursitis, right elbow: Secondary | ICD-10-CM | POA: Diagnosis not present

## 2020-11-29 DIAGNOSIS — E1122 Type 2 diabetes mellitus with diabetic chronic kidney disease: Secondary | ICD-10-CM | POA: Diagnosis not present

## 2020-11-29 DIAGNOSIS — F32A Depression, unspecified: Secondary | ICD-10-CM | POA: Diagnosis not present

## 2020-11-29 DIAGNOSIS — N1832 Chronic kidney disease, stage 3b: Secondary | ICD-10-CM | POA: Diagnosis not present

## 2020-11-29 DIAGNOSIS — K219 Gastro-esophageal reflux disease without esophagitis: Secondary | ICD-10-CM | POA: Diagnosis not present

## 2020-11-29 DIAGNOSIS — J4550 Severe persistent asthma, uncomplicated: Secondary | ICD-10-CM | POA: Diagnosis not present

## 2020-11-29 LAB — COMPLETE METABOLIC PANEL WITH GFR
AG Ratio: 1.5 (calc) (ref 1.0–2.5)
ALT: 48 U/L — ABNORMAL HIGH (ref 6–29)
AST: 48 U/L — ABNORMAL HIGH (ref 10–35)
Albumin: 3.6 g/dL (ref 3.6–5.1)
Alkaline phosphatase (APISO): 104 U/L (ref 37–153)
BUN/Creatinine Ratio: 19 (calc) (ref 6–22)
BUN: 23 mg/dL (ref 7–25)
CO2: 28 mmol/L (ref 20–32)
Calcium: 9.9 mg/dL (ref 8.6–10.4)
Chloride: 104 mmol/L (ref 98–110)
Creat: 1.19 mg/dL — ABNORMAL HIGH (ref 0.60–1.00)
Globulin: 2.4 g/dL (calc) (ref 1.9–3.7)
Glucose, Bld: 106 mg/dL — ABNORMAL HIGH (ref 65–99)
Potassium: 4.2 mmol/L (ref 3.5–5.3)
Sodium: 143 mmol/L (ref 135–146)
Total Bilirubin: 0.5 mg/dL (ref 0.2–1.2)
Total Protein: 6 g/dL — ABNORMAL LOW (ref 6.1–8.1)
eGFR: 49 mL/min/{1.73_m2} — ABNORMAL LOW (ref >=60)

## 2020-11-29 LAB — SEDIMENTATION RATE: Sed Rate: 63 mm/h — ABNORMAL HIGH (ref 0–30)

## 2020-11-29 LAB — C-REACTIVE PROTEIN: CRP: 11.8 mg/L — ABNORMAL HIGH (ref <8.0)

## 2020-11-29 NOTE — Progress Notes (Signed)
Inflammation markers are still above normal but decreased by half compared to 2 months ago. I recommend trying to decrease the prednisone down to 11 mg daily for a month then down to 10 mg daily, like we discussed in clinic. The kidney function numbers improved also from estimated GFR of 32 to 49 which is back to previous baseline values or better.

## 2020-11-30 ENCOUNTER — Other Ambulatory Visit: Payer: Self-pay

## 2020-11-30 ENCOUNTER — Ambulatory Visit (INDEPENDENT_AMBULATORY_CARE_PROVIDER_SITE_OTHER): Payer: Medicare PPO | Admitting: Family

## 2020-11-30 ENCOUNTER — Encounter (HOSPITAL_BASED_OUTPATIENT_CLINIC_OR_DEPARTMENT_OTHER): Payer: Self-pay | Admitting: Family

## 2020-11-30 VITALS — BP 120/70 | HR 77 | Ht 61.0 in | Wt 129.1 lb

## 2020-11-30 DIAGNOSIS — I429 Cardiomyopathy, unspecified: Secondary | ICD-10-CM | POA: Diagnosis not present

## 2020-11-30 DIAGNOSIS — N1831 Chronic kidney disease, stage 3a: Secondary | ICD-10-CM

## 2020-11-30 DIAGNOSIS — I5020 Unspecified systolic (congestive) heart failure: Secondary | ICD-10-CM

## 2020-11-30 DIAGNOSIS — I2510 Atherosclerotic heart disease of native coronary artery without angina pectoris: Secondary | ICD-10-CM | POA: Diagnosis not present

## 2020-11-30 MED ORDER — EMPAGLIFLOZIN 10 MG PO TABS: 10.0000 mg | ORAL_TABLET | Freq: Every day | ORAL | 5 refills | Status: DC

## 2020-11-30 NOTE — Progress Notes (Signed)
Office Visit    Patient Name: Ebony Scott Date of Encounter: 11/30/2020  PCP:  [name], MD    [facility]  Cardiologist:  Jodelle Red, MD  Advanced Practice Provider:  No care team member to display Electrophysiologist:  None      Chief Complaint    Ebony Scott is a 70 y.o. 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, seronegative rheumatoid arthritis, DVT, DM2, GERD, fibromyalgia, immunodeficiencyimmunodeficiency presents today for failure follow-up  Past Medical History    Past Medical History:  Diagnosis Date   Abscess of dorsum of right hand 09/22/2020   Acute deep vein thrombosis (DVT) of right lower extremity (HCC) 12/13/2017   Allergic rhinitis    Anemia    Angio-edema    Anxiety    pt denies   Arthritis    Phreesia 10/21/2019   Asthma    Phreesia 10/21/2019   Breast cancer (HCC) 1998   in remission   Cataract    REMOVED   Clostridium difficile colitis 01/14/2018   Clostridium difficile diarrhea 01/14/2018   Complication of anesthesia    "had hard time waking up from it several times" (02/20/2012)   Depression    "some; don't take anything for it" (02/20/2012)   Diverticulosis    DVT (deep venous thrombosis) (HCC)    Fibromyalgia 11/2011   GERD (gastroesophageal reflux disease)    Graves disease    Headache(784.0)    "related to allergies; more at different times during the year" (02/20/2012)   Hemorrhoids    Hiatal hernia    back and neck   Hx of adenomatous colonic polyps 04/12/2016   Hypercholesteremia    good cholesterol is high   Hypothyroidism    IBS (irritable bowel syndrome)    Moderate persistent asthma    -FeV1 72% 2011, -IgE 102 2011, CT sinus Neg 2011   Osteoporosis    on reclast yearly   Peripheral vascular disease (HCC) 2019   DVTs   Pneumonia 04/2011; ~ 11/2011    "double; single" (02/20/2012)   Septic olecranon bursitis of right elbow 09/22/2020   Seronegative rheumatoid arthritis (HCC)    Dr. Casimer Lanius   SIRS (systemic inflammatory response syndrome) (HCC) 02/10/2018   Tracheobronchomalacia    Past Surgical History:  Procedure Laterality Date   ABDOMINAL HYSTERECTOMY N/A    Phreesia 10/21/2019   ANTERIOR AND POSTERIOR REPAIR  1990's   APPENDECTOMY     BREAST LUMPECTOMY  1998   left   BREAST SURGERY N/A    Phreesia 10/21/2019   BRONCHIAL WASHINGS  04/05/2020   Procedure: BRONCHIAL WASHINGS;  Surgeon: Martina Sinner, MD;  Location: [facility];  Service: Pulmonary;;   CARPOMETACARPEL (CMC) FUSION OF THUMB WITH AUTOGRAFT FROM RADIUS  ~ 2009   "both thumbs" (02/20/2012)   CATARACT EXTRACTION W/ INTRAOCULAR LENS  IMPLANT, BILATERAL  2012   CERVICAL DISCECTOMY  10/2001   C5-C6   CERVICAL FUSION  2003   C3-C4   CHOLECYSTECTOMY     COLONOSCOPY     DEBRIDEMENT TENNIS ELBOW  ?1970's   right   ESOPHAGOGASTRODUODENOSCOPY     EYE SURGERY N/A    Phreesia 10/21/2019   HYSTERECTOMY     I & D EXTREMITY Right 09/22/2020   Procedure: IRRIGATION AND DEBRIDEMENT RIGHT HAND AND ELBOW;  Surgeon: Teryl Lucy, MD;  Location: [facility];  Service: Orthopedics;  Laterality:  Right;   KNEE ARTHROPLASTY  ?1990's   "?right; w/cartilage repair" (02/20/2012)   NASAL SEPTUM SURGERY  1980's   POSTERIOR CERVICAL FUSION/FORAMINOTOMY  2004   "failed initial fusion; rewired  anterior neck" (02/20/2012)   REVERSE SHOULDER ARTHROPLASTY Right 02/16/2020   Procedure: REVERSE SHOULDER ARTHROPLASTY;  Surgeon: Marchia Bond, MD;  Location: [facility];  Service: Orthopedics;  Laterality: Right;   SPINE SURGERY N/A    Phreesia 10/21/2019   TONSILLECTOMY  ~ 1953   VESICOVAGINAL FISTULA CLOSURE W/ TAH  1988   VIDEO BRONCHOSCOPY Bilateral 08/23/2016   Procedure: VIDEO BRONCHOSCOPY WITH FLUORO;  Surgeon: Javier Glazier, MD;  Location: [facility];  Service: Cardiopulmonary;   Laterality: Bilateral;   VIDEO BRONCHOSCOPY N/A 04/05/2020   Procedure: VIDEO BRONCHOSCOPY WITHOUT FLUORO;  Surgeon: Freddi Starr, MD;  Location: [facility];  Service: Pulmonary;  Laterality: N/A;    Allergies  Allergies  Allergen Reactions   Baclofen Other (See Comments)    Altered mental status requiring a 3-day hospital stay- patient became unresponsive   Dust Mite Extract Shortness Of Breath and Other (See Comments)    "sneezing" (02/20/2012)   Molds & Smuts Shortness Of Breath   Morphine And Related Hives and Itching   Other Shortness Of Breath and Other (See Comments)    Grass and weeds "sneezing; filled sinuses" (02/20/2012)   Penicillins Rash and Other (See Comments)    "welts" (02/20/2012) Tolerated Ancef 02/16/20.    Rofecoxib Swelling and Other (See Comments)    Vioxx- feet swelling    Shellfish Allergy Anaphylaxis, Shortness Of Breath, Itching and Rash   Shrimp Flavor Anaphylaxis and Other (See Comments)    ALL SHELLFISH, also   Tetracycline Hcl Nausea And Vomiting   Xolair [Omalizumab] Other (See Comments)    Caused Blood clot   Zoledronic Acid Other (See Comments)    Fever, Put in hospital, dr said it was a reaction from a reaction  Other reaction(s): Unknown   Dilaudid [Hydromorphone Hcl] Itching   Levofloxacin Other (See Comments)    GI upset   Oxycodone Hcl Nausea And Vomiting   Paroxetine Nausea And Vomiting and Other (See Comments)    Paxil    Celecoxib Swelling and Other (See Comments)    Feet swelling   Dilaudid [Hydromorphone] Other (See Comments)    "Allergic," per [facility]   Diltiazem Swelling   Diltiazem Hcl Swelling   Hydrocodone-Acetaminophen Nausea And Vomiting and Other (See Comments)    "Allergic," per [facility]   Lactose Intolerance (Gi) Other (See Comments)    Bloating and gas   Morphine Sulfate Other (See Comments)    "Gets loopy"   Oxycodone-Acetaminophen Other (See Comments)    Reaction??   Strawberry Flavor Other (See Comments)     Reaction?   Tree Extract Other (See Comments)    "tested and told I was allergic to it; never experienced a reaction to it" (02/20/2012)   Penicillin G Procaine Rash and Other (See Comments)    Welts, also    History of Present Illness    Ebony Scott is a 70 y.o. 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 Harrell Gave.  CT chest June 2019 with diffuse calcification including aorta, aortic branch vessels, coronary arteries.  Normal Lexiscan in the past.  She was hospitalized July 2022 with acute renal failure, acute  systolic and diastolic heart failure.  Echocardiogram showing new onset heart failure with LVEF 35-40%, wall motion abnormalities, LV moderately dilated, grade 2 diastolic dysfunction, elevated LVEDP, mild MR, moderate calcification of aortic valve without stenosis.  She was seen in follow-up 10/26/20.  [place], ACE/ARB/Arni/MRA were on hold due to recent kidney injury.  Cardiac catheterization was held due to kidney dysfunction. She was started on half tablet of Bidil 20-27.$RemoveBefore'5mg'ydMAcqMFxXlVe$  BID. She was encouraged to take Lasix $RemoveBe'40mg'InStaslLl$  QD as prescribed.    Since last seen she has been started on weekly injections of IVIG by Dr. Neldon Mc for immunodeficiency. Her Lasix has been reduced to $RemoveBe'20mg'EpvkiRprp$  QD by her primary care provider. 11/28/20 labs show K 4.2, creatinine 1.19, GFR 49, AST 48, ALT 48. Kidney function back to previous baseline.  '  She presents today for follow up with her daughter. She endorses feeling fatigued since last seen. She is slightly overwhelmed with weekly IVIG infusions. She reports mild LE edema. No orthopnea, PND. Her DOE is stable at baseline. No chest pain, pressure, tightness. We reviewed her most recent labs with renal function back to previous baseline  and she is pleased with result. Her most recent A1c is 8.1 which is presumed due to prednisone use.   EKGs/Labs/Other Studies Reviewed:   The following studies were reviewed today:  Echo 09/16/20 1. Septal apical and anterior wall hypokinesis EF decreased compard to TTE done 07/10/19 . Left ventricular ejection fraction, by estimation, is 35 to 40%. The left ventricle has moderately decreased function. The left ventricle demonstrates regional wall motion abnormalities (see scoring diagram/findings for description). The left ventricular internal cavity size was moderately dilated. Left ventricular diastolic parameters are consistent with Grade II diastolic dysfunction (pseudonormalization). Elevated left ventricular end-diastolic pressure.   2. Right ventricular systolic function is normal. The right ventricular size is normal.   3. Left atrial size was mildly dilated.   4. The mitral valve is abnormal. Mild mitral valve regurgitation. No evidence of mitral stenosis.   5. The aortic valve is tricuspid. There is moderate calcification of the aortic valve. Aortic valve regurgitation is not visualized. Mild to moderate aortic valve sclerosis/calcification is present, without any evidence of aortic stenosis.   6. The inferior vena cava is normal in size with greater than 50% respiratory variability, suggesting right atrial pressure of 3 mmHg.   FINDINGS   Left Ventricle: Septal apical and anterior wall hypokinesis EF decreased compard to TTE done 07/10/19.   Echo 07/10/19 1. Hyperdynamic LV function; grade 1 diastolic dysfunction; moderate LVH;  trace AI.   2. Left ventricular ejection fraction, by estimation, is >75%. The left  ventricle has hyperdynamic function. The left ventricle has no regional  wall motion abnormalities. There is moderate left ventricular hypertrophy.  Left ventricular diastolic  parameters are consistent with Grade I diastolic dysfunction (impaired  relaxation).   3. Right  ventricular systolic function is normal. The right ventricular  size is normal.   4. The mitral valve is normal in structure. No evidence of mitral valve  regurgitation. No evidence of mitral stenosis.   5. The aortic valve has an indeterminant number of cusps. Aortic valve  regurgitation is trivial. No aortic stenosis is present.   Echo 08/05/17 Study Conclusions   - Left ventricle: The cavity size was normal. There was mild focal   basal hypertrophy of the septum. Systolic function was vigorous.   The estimated ejection fraction was in the range of 65% to 70%.   Wall motion  was normal; there were no regional wall motion   abnormalities. Doppler parameters are consistent with abnormal   left ventricular relaxation (grade 1 diastolic dysfunction).   Impressions:   - Definity used; vigorous LV systolic function; mild diastolic   dysfunction.   CT chest high resolution 6.17.19 FINDINGS: Cardiovascular: Heart size is normal. There is no significant pericardial fluid, thickening or pericardial calcification. There is aortic atherosclerosis, as well as atherosclerosis of the great vessels of the mediastinum and the coronary arteries, including calcified atherosclerotic plaque in the left main, left anterior descending left circumflex coronary arteries.   IMPRESSION: 1. No findings to suggest interstitial lung disease. 2. Mild air trapping indicative of mild small airways disease. 3. Aortic atherosclerosis, in addition to left main and 2 vessel coronary artery disease. Please note that although the presence of coronary artery calcium documents the presence of coronary artery disease, the severity of this disease and any potential stenosis cannot be assessed on this non-gated CT examination. Assessment for potential risk factor modification, dietary therapy or pharmacologic therapy may be warranted, if clinically indicated. 4. Additional incidental findings, as above.   Stress  2011:  Stress echo 11/2009 Study Conclusions - Stress ECG conclusions: There were no stress arrhythmias or conduction abnormalities. The stress ECG was normal. - Staged echo: There was no echocardiographic evidence for stress-induced ischemia. Impressions: - Nondiagnostic stress echocardiogram with nochest pain, no ST changes; patient did not achieve target heart rate; no   stress-induced wall motion abnormalities.   Had [place] 12/2009 due to nondiagnostic stress echo: Stress Procedure  The patient received IV Lexiscan 0.4 mg over 15-seconds with concurrent low level exercise and then Technetium 25m Tetrofosmin was injected at 30-seconds while the patient continued walking one more minute.  There were no significant changes with Lexiscan.  Quantitative spect images were obtained after a 45 minute delay.   QPS Raw Data Images:  Normal; no motion artifact; normal heart/lung ratio. Stress Images:  Normal homogeneous uptake in all areas of the myocardium. Rest Images:  Normal homogeneous uptake in all areas of the myocardium. Subtraction (SDS):  No evidence of ischemia. Transient Ischemic Dilatation:  1.15  (Normal <1.22)  Lung/Heart Ratio:  .27  (Normal <0.45)   Quantitative Gated Spect Images QGS EDV:  52 ml QGS ESV:  13 ml QGS EF:  75 % QGS cine images:  Normal motion   Findings Normal nuclear study  EKG:  No EKG today  Recent Labs: 09/23/2020: TSH 0.983 10/04/2020: Magnesium 3.1 10/09/2020: B Natriuretic Peptide 3,823.5 10/27/2020: Hemoglobin 11.5; Platelets 211 11/28/2020: ALT 48; BUN 23; Creat 1.19; Potassium 4.2; Sodium 143  Recent Lipid Panel    Component Value Date/Time   CHOL 318 (H) 09/23/2020 1253   TRIG 475 (H) 09/23/2020 1253   HDL 38 (L) 09/23/2020 1253   CHOLHDL 8.4 09/23/2020 1253   VLDL UNABLE TO CALCULATE IF TRIGLYCERIDE OVER 400 mg/dL 09/23/2020 1253   LDLCALC UNABLE TO CALCULATE IF TRIGLYCERIDE OVER 400 mg/dL 09/23/2020 1253   LDLCALC 77 07/04/2018 1017    LDLDIRECT 141.2 (H) 09/23/2020 1253    Risk Assessment/Calculations:   CHA2DS2-VASc Score = 6  {This indicates a 9.7% annual risk of stroke. The patient's score is based upon: CHF History: 1 HTN History: 1 Diabetes History: 1 Stroke History: 0 Vascular Disease History: 1 Age Score: 1 Gender Score: 1   Home Medications   Current Meds  Medication Sig   albuterol (PROVENTIL) (2.5 MG/3ML) 0.083% nebulizer solution Take 3 mLs (2.5 mg total)  by nebulization every 4 (four) hours as needed for wheezing or shortness of breath.   allopurinol (ZYLOPRIM) 100 MG tablet Take 100 mg by mouth daily.   Alpha-D-Galactosidase (BEANO MELTAWAYS) 450 units TBDP Take by mouth.   Alpha-Lipoic Acid 600 MG TABS Take by mouth.   atorvastatin (LIPITOR) 80 MG tablet Take 1 tablet (80 mg total) by mouth daily.   azelastine (ASTELIN) 0.1 % nasal spray Use in each nostril as directed   benzonatate (TESSALON) 200 MG capsule Take 1 capsule (200 mg total) by mouth 3 (three) times daily as needed for cough.   Blood Glucose Monitoring Suppl (BLOOD GLUCOSE SYSTEM PAK) KIT Please dispense per patient and insurance preference. Use as directed to monitor FSBS 4x . Dx: E11.65   budesonide (PULMICORT) 0.5 MG/2ML nebulizer solution Take 2 mLs (0.5 mg total) by nebulization 2 (two) times daily as needed.   Calcium Citrate (CITRACAL PO) Take by mouth.   cetirizine (ZYRTEC) 10 MG tablet Take 1 tablet (10 mg total) by mouth daily.   Cetirizine HCl (ZYRTEC ALLERGY CHILDRENS) 10 MG TBDP    cholecalciferol (VITAMIN D) 25 MCG (1000 UNIT) tablet    cholecalciferol (VITAMIN D) 25 MCG (1000 UT) tablet Take 1,000 Units by mouth daily.    DEXILANT 60 MG capsule TAKE 1 CAPSULE BY MOUTH EACH MORNING   diclofenac Sodium (VOLTAREN) 1 % GEL Apply topically 4 (four) times daily.   empagliflozin (JARDIANCE) 10 MG TABS tablet Take 1 tablet (10 mg total) by mouth daily before breakfast.   EPINEPHrine (EPIPEN 2-PAK) 0.3 mg/0.3 mL IJ SOAJ  injection Inject 0.3 mLs (0.3 mg total) into the muscle Once PRN.   famotidine (PEPCID) 20 MG tablet Take 20 mg by mouth at bedtime.   fluticasone (FLONASE) 50 MCG/ACT nasal spray USE 2 SPRAYS IN EACH NOSTRIL DAILY   folic acid (FOLVITE) 1 MG tablet Take 1 mg by mouth daily.   furosemide (LASIX) 40 MG tablet Take 1 tablet (40 mg total) by mouth daily. (Patient taking differently: Take 40 mg by mouth daily. Patient states she is taking 20 mg)   Gelatin Capsules, Empty, (CAPSULE SIZE 1 LACTOSE) CAPS by Does not apply route.   glipiZIDE (GLIPIZIDE XL) 5 MG 24 hr tablet Take 1 tablet (5 mg total) by mouth daily with breakfast.   Glucose Blood (BLOOD GLUCOSE TEST STRIPS) STRP Please dispense per patient and insurance preference. Use as directed to monitor FSBS 4x . Dx: E11.65   guaiFENesin (MUCINEX) 600 MG 12 hr tablet Take 1 tablet (600 mg total) by mouth 2 (two) times daily as needed for cough or to loosen phlegm.   hydrocortisone cream-nystatin cream-zinc oxide Apply topically.   Immune Globulin, Human, (HIZENTRA) 10 GM/50ML SOLN Inject into the skin.   isosorbide-hydrALAZINE (BIDIL) 20-37.5 MG tablet Take 1 tablet by mouth 2 (two) times daily.   magnesium oxide (MAG-OX) 400 MG tablet Take 400 mg by mouth daily.   mirtazapine (REMERON) 30 MG tablet    MIRTAZAPINE PO Take by mouth.   montelukast (SINGULAIR) 10 MG tablet Take 1 tablet (10 mg total) by mouth daily.   montelukast (SINGULAIR) 10 MG tablet    nystatin ointment (MYCOSTATIN) Apply 1 application topically 2 (two) times daily.   predniSONE (DELTASONE) 10 MG tablet Take 1 tablet (10 mg total) by mouth daily with breakfast. Take with additional 2 tabs of $Remov'1mg'vIdKlP$  Prednisone to equal $Remove'12mg'uhrnliG$  daily.   Prenatal Vit-Fe Fumarate-FA (PRENATAL VITAMINS PO) Take by mouth.   Probiotic Product (ALIGN PO)  Take by mouth.   Propylene Glycol (SYSTANE COMPLETE) 0.6 % SOLN Place 1 drop into both eyes 2 (two) times daily.   Tiotropium Bromide-Olodaterol (STIOLTO  RESPIMAT) 2.5-2.5 MCG/ACT AERS Inhale 2 puffs into the lungs daily.   traMADol (ULTRAM) 50 MG tablet Take 50 mg by mouth at bedtime as needed.   XARELTO 20 MG TABS tablet TAKE 1 TABLET BY MOUTH DAILY WITH SUPPER   Current Facility-Administered Medications for the 11/30/20 encounter (Office Visit) with Loel Dubonnet, NP  Medication   denosumab (PROLIA) injection 60 mg    Review of Systems      All other systems reviewed and are otherwise negative except as noted above.  Physical Exam    VS:  BP 120/70   Pulse 77   Ht 5\' 1"  (1.549 m)   Wt 129 lb 1.6 oz (58.6 kg)   SpO2 93%   BMI 24.39 kg/m  , BMI Body mass index is 24.39 kg/m.  Wt Readings from Last 3 Encounters:  11/30/20 129 lb 1.6 oz (58.6 kg)  11/28/20 128 lb 3.2 oz (58.2 kg)  11/15/20 126 lb (57.2 kg)     GEN: Well nourished, well developed, in no acute distress. HEENT: normal. Neck: Supple, no JVD, carotid bruits, or masses. Cardiac: RRR, no murmurs, rubs, or gallops. No clubbing, cyanosis. Bilateral non pitting pretibial edema. .  Radials/PT 2+ and equal bilaterally.  Respiratory:  Respirations regular and unlabored, clear to auscultation bilaterally. GI: Soft, nontender, nondistended. MS: No deformity or atrophy. Skin: Warm and dry, no rash. Neuro:  Strength and sensation are intact. Psych: Normal affect.  Assessment & Plan    Cardiomyopathy / Combined systolic and diastolic heart failure - Weight up 5 pounds over last month. Per pt report her PCP reduced Lasix to 20mg  QD. Able to further up-titrate GDMT today as her renal function has returned to her baseline. Continue Bidil 20-37.5mg  BID. Will continue Lasix 20mg  QD with additional 20mg  for weight gain of 2 lbs overnight or 5 lbs in one week. For optimization of GDMT, start Jardiance 10mg  QD with BMP in 1-2 weeks. Vania Rea is preferred by her insurance over [place]. Careful monitoring of renal function. Future considerations include MRA, ARNI though may be  limited by relative hypotension.   DM2 - Likely due to prednisone. Most recent A1c 8.1. Add Jardiance, as detailed above. Continue to follow with PCP.   Coronary artery calcification on CT - No chest pain. Plan for cardiac catheterization given newly reduced LVEF now that renal function has normalized. Daughter would like to be present and has to be off work so this has been scheduled for 01/11/21. Will have repeat visit prior to be within 30 days for HPI.   Shared Decision Making/Informed Consent{ The risks [stroke (1 in 1000), death (1 in 1000), kidney failure [usually temporary] (1 in 500), bleeding (1 in 200), allergic reaction [possibly serious] (1 in 200)], benefits (diagnostic support and management of coronary artery disease) and alternatives of a cardiac catheterization were discussed in detail with Ms. Hommes and she is willing to proceed.   CKD 3a - Careful titration of diuretic and antihypertensive.  CMP in 1 week.   HLD, LDL goal <70 - 09/23/20 total cholesterol 318, triglycerides 475, HDL 38. Continue Atorvastatin 80mg  QD. Repeat fasting lipid panel ordered today to be collected in 1 week.  Disposition: Follow up in 1 month(s) with Dr. Harrell Gave or APP.  Signed, Loel Dubonnet, NP 11/30/2020, 7:17 PM [facility]  

## 2020-11-30 NOTE — Patient Instructions (Addendum)
Medication Instructions:  Your physician has recommended you make the following change in your medication:    START Jardiance one 10mg  tablet daily   This medication helps control your blood sugar and improves your heart pumping function.   *If you need a refill on your cardiac medications before your next appointment, please call your pharmacy*   Lab Work: Your physician recommends that you return for lab work in 1-2 weeks for CMP, lipid panel. Please be fasting.   If you have labs (blood work) drawn today and your tests are completely normal, you will receive your results only by: [place] (if you have MyChart) OR A paper copy in the mail If you have any lab test that is abnormal or we need to change your treatment, we will call you to review the results.  Testing/Procedures: Your physician has requested that you have a cardiac catheterization. Cardiac catheterization is used to diagnose and/or treat various heart conditions. Doctors may recommend this procedure for a number of different reasons. The most common reason is to evaluate chest pain. Chest pain can be a symptom of coronary artery disease (CAD), and cardiac catheterization can show whether plaque is narrowing or blocking your heart's arteries. This procedure is also used to evaluate the valves, as well as measure the blood flow and oxygen levels in different parts of your heart. Please follow instruction sheet, as given.    Follow-Up: At [facility], you and your health needs are our priority.  As part of our continuing mission to provide you with exceptional heart care, we have created designated Provider Care Teams.  These Care Teams include your primary Cardiologist (physician) and Advanced Practice Providers (APPs -  Physician Assistants and Nurse Practitioners) who all work together to provide you with the care you need, when you need it.  We recommend signing up for the patient portal called "MyChart".  Sign up  information is provided on this After Visit Summary.  MyChart is used to connect with patients for Virtual Visits (Telemedicine).  Patients are able to view lab/test results, encounter notes, upcoming appointments, etc.  Non-urgent messages can be sent to your provider as well.   To learn more about what you can do with MyChart, go to [email or web address].    Your next appointment:   4-5 week(s)  The format for your next appointment:   In Person  Provider:   Buford Dresser, MD or Loel Dubonnet, NP   Other Instructions  To prevent or reduce lower extremity swelling: Eat a low salt diet. Salt makes the body hold onto extra fluid which causes swelling. Sit with legs elevated. For example, in the recliner or on an [place].  Wear knee-high compression stockings during the daytime. Ones labeled 15-20 mmHg provide good compression.   Recommend weighing daily and keeping a log. Please call our office if you have weight gain of 2 pounds overnight or 5 pounds in 1 week.   ________________________________________________________  [address] [place] [id number] Dept: [place]  11/30/2020  You are scheduled for a Cardiac Catheterization on Wednesday, November 23 with Dr. Glenetta Hew.  1. Please arrive at the [facility] (Main Entrance A) at [facility]: [address] [place], [place] [address] at 6:30 AM (This time is two hours before your procedure to ensure your preparation). Free valet parking service is available.   Special note: Every effort is made to have  your procedure done on time. Please understand that emergencies sometimes delay scheduled procedures.  2. Diet: Do not eat solid foods after midnight.  The patient may have clear liquids until 5am upon the day of the procedure.  3. Labs: You will need to have blood drawn on Wednesday, November 16  at [facility] or [facility] do not need to be fasting.  4. Medication instructions in preparation for your procedure:   Contrast Allergy: No  *For reference purposes while preparing patient instructions.   Delete this med list prior to printing instructions for patient.*  Stop taking Xarelto (Rivaroxaban) on Monday, November 21.  Stop taking, Lasix (Furosemide)  Tuesday, November 22,  On the morning of your procedure, take your Aspirin and any morning medicines NOT listed above.  You may use sips of water.  5. Plan for one night stay--bring personal belongings. 6. Bring a current list of your medications and current insurance cards. 7. You MUST have a responsible person to drive you home. 8. Someone MUST be with you the first 24 hours after you arrive home or your discharge will be delayed. 9. Please wear clothes that are easy to get on and off and wear slip-on shoes.  Thank you for allowing [place] to care for you!   -- [facility]

## 2020-12-01 DIAGNOSIS — K219 Gastro-esophageal reflux disease without esophagitis: Secondary | ICD-10-CM | POA: Diagnosis not present

## 2020-12-01 DIAGNOSIS — M069 Rheumatoid arthritis, unspecified: Secondary | ICD-10-CM | POA: Diagnosis not present

## 2020-12-01 DIAGNOSIS — J4550 Severe persistent asthma, uncomplicated: Secondary | ICD-10-CM | POA: Diagnosis not present

## 2020-12-01 DIAGNOSIS — I5042 Chronic combined systolic (congestive) and diastolic (congestive) heart failure: Secondary | ICD-10-CM | POA: Diagnosis not present

## 2020-12-01 DIAGNOSIS — N1832 Chronic kidney disease, stage 3b: Secondary | ICD-10-CM | POA: Diagnosis not present

## 2020-12-01 DIAGNOSIS — E1122 Type 2 diabetes mellitus with diabetic chronic kidney disease: Secondary | ICD-10-CM | POA: Diagnosis not present

## 2020-12-01 DIAGNOSIS — M71121 Other infective bursitis, right elbow: Secondary | ICD-10-CM | POA: Diagnosis not present

## 2020-12-01 DIAGNOSIS — F32A Depression, unspecified: Secondary | ICD-10-CM | POA: Diagnosis not present

## 2020-12-01 DIAGNOSIS — E1151 Type 2 diabetes mellitus with diabetic peripheral angiopathy without gangrene: Secondary | ICD-10-CM | POA: Diagnosis not present

## 2020-12-02 ENCOUNTER — Other Ambulatory Visit: Payer: Self-pay | Admitting: Allergy and Immunology

## 2020-12-02 DIAGNOSIS — E119 Type 2 diabetes mellitus without complications: Secondary | ICD-10-CM

## 2020-12-05 ENCOUNTER — Other Ambulatory Visit: Payer: Self-pay

## 2020-12-05 ENCOUNTER — Telehealth: Payer: Self-pay

## 2020-12-05 ENCOUNTER — Encounter: Payer: Self-pay | Admitting: Family Medicine

## 2020-12-05 ENCOUNTER — Ambulatory Visit (INDEPENDENT_AMBULATORY_CARE_PROVIDER_SITE_OTHER): Payer: Medicare PPO | Admitting: Family Medicine

## 2020-12-05 VITALS — BP 102/64 | HR 78 | Temp 97.8°F | Resp 16 | Ht 61.0 in | Wt 127.0 lb

## 2020-12-05 DIAGNOSIS — I5020 Unspecified systolic (congestive) heart failure: Secondary | ICD-10-CM | POA: Diagnosis not present

## 2020-12-05 DIAGNOSIS — E1169 Type 2 diabetes mellitus with other specified complication: Secondary | ICD-10-CM | POA: Diagnosis not present

## 2020-12-05 DIAGNOSIS — M810 Age-related osteoporosis without current pathological fracture: Secondary | ICD-10-CM

## 2020-12-05 MED ORDER — TRAMADOL HCL 50 MG PO TABS: 50.0000 mg | ORAL_TABLET | Freq: Every evening | ORAL | 1 refills | Status: DC | PRN

## 2020-12-05 MED ORDER — EMPAGLIFLOZIN 10 MG PO TABS: 10.0000 mg | ORAL_TABLET | Freq: Every day | ORAL | 5 refills | Status: DC

## 2020-12-05 NOTE — Progress Notes (Signed)
Subjective:    Patient ID: Ebony Scott, female    DOB: October 28, 1950, 70 y.o.   MRN: [id number]  HPI Admit date: 10/04/2020 Discharge date: 10/11/2020    Recommendations for Outpatient Follow-up:  Follow-up cardiology in 1 week Follow-up orthopedics, Dr. Mardelle Matte as outpatient1 week Continue daily dressing changes, ok to use small mepilexes. Patient will follow up 1 week with orthopedics  Obtain BMP in 1 week   Discharge Diagnoses:  Principal Problem:   ARF (acute renal failure) ([place]) Active Problems:   Chronic combined systolic and diastolic CHF (congestive heart failure) (HCC)   Chronic kidney disease, stage 3b (HCC)   DVT (deep venous thrombosis) (HCC)   Rheumatoid arthritis (HCC)   DMII (diabetes mellitus, type 2) (HCC)     History of present illness:    70 year old female with history of diffuse aorta and coronary calcifications noted on chest CT on 07/2017, chronic combined CHF with a EF of 35 to 40% on recent echo, SVT, prior PE on lifelong anticoagulation, asthma, tracheobronchomalacia on CPAP, bronchiectasis, vocal cord dysfunction, seronegative rheumatoid arthritis, CKD stage III who was recently mated to the hospital for septic right elbow olecranon bursitis and cellulitis of right hand.  She underwent incision and drainage on 09/22/2020.  Cardiology was consulted, and she was started on Entresto and bisoprolol.  Discharged on 09/30/2020.   Patient was readmitted with AKI, creatinine of 4.07.  Her creatinine was 1.8 on discharge on 11th August   Hospital Course:    Acute on chronic kidney disease stage III -Baseline creatinine around 1.6-1.8 -Presented with creatinine of 4.07; likely from Entresto and hypotension -[name] is currently on hold -Started on IV normal saline; creatinine improved to 1.43 -IV fluids were discontinued       Acute on chronic  combined systolic and diastolic CHF -Last EF was 35 to 40% in July 2022 -Entresto and bisoprolol were held -Cardiology  has restarted bisoprolol, Entresto is still on hold -Diuretics on hold -Repeat BNP showed elevation at 3823.5 -Diuresed 1.5 L with low-dose Lasix 20 mg daily  -Continue Lasix 20 mg p.o. daily.   -Started on BiDil 1 tablet p.o. 3 times daily by cardiology -Cardiology has signed off, recommendations per cardiology include Medication Recommendations:  Atorvastatin 41m daily, Bisoprolol 2.573mdaily, Lasix 2014maily, Bidil 20-37.5mg90mD ( may need 2 tabs TID if BP remains uncontrolled),  Other recommendations (labs, testing, etc):  BMET in 1 week Follow up as an outpatient:  Followup with Dr. ChriHarrell Gave1-2 weeks      COPD exacerbation -Resolved, -Continue Brovana, Incruse Ellipta -As needed albuterol nebulizer --Continue 2 L/min oxygen at bedtime   Diabetes mellitus type 2 -Hemoglobin A1c was 8.1, 2 weeks ago -CBGs elevated due to steroids -Steroids have been weaned off to prednisone 20 mg p.o. daily -Continue taking prednisone 12 mg daily -Continue sensitive sliding scale insulin with NovoLog     History of rheumatoid arthritis -Patient is on prednisone and Arava at home -Started on Solu-Cortef stress dose 50 mg every 8 hours -Solu-Cortef has been weaned off -Patient takes prednisone 12 mg daily at home     History of DVT/PE -Patient is on Xarelto at home -[place]ed   Recent septic olecranon bursitis -S/p debridement during last admission -Culture grew Candida albicans; started on fluconazole till October 20, 2020 -Patient to follow-up with orthopedic surgery on September 2   Leukocytosis -Likely from steroids  10/27/20 Here for follow up.  There seems to be a fair amount of confusion regarding her medication list.  The medicines from the hospital and the medicines from the nursing home are contradictory.  Therefore we spent 30 minutes today going over her medication list and trying to simplify.  Her blood pressure  this morning at her infectious disease specialist was barely above 90 systolic.  This afternoon is better but she has been rushing to get here.  Come to find out she has not been taking her bisoprolol for the last 3 days since leaving the nursing home.  Despite that her blood pressure this morning was still extremely low.  She has recently increased her Lasix to 40 mg a day up from 20.  Her A1c in the hospital was 8.4.  She would be an excellent candidate for Jardiance if her blood pressure and kidneys can tolerate it.  Today her lungs on exam sound clear with no wheezes or crackles or rails.  She does have +2 edema distal to the knees in both legs however I do not believe that she can tolerate increased diuresis due to hypotension.  At that time, my plan was: We tried to simplify her medication list.  First for her anticoagulant she will be on Xarelto once daily.  Second for her COPD/asthma she will be on Stiolto 2 puffs inhaled daily with as needed albuterol.  Third for her congestive heart failure she will be on BiDil 1/2 tablet twice daily.  Due to hypotension were going to hold bisoprolol for now and recheck her blood pressure next week and decide if she can resume bisoprolol.  Decrease Lasix to 20 mg a day due to hypotension.  Regarding her chronic pain, I think reducing her overall medication would help prevent dizziness and fall so of asked her to try to wean off and discontinue gabapentin which she is taking for neuropathic pain.  This may also help some of the swelling.  Of asked her to use tramadol twice daily as needed for pain.  Third regarding her rheumatoid arthritis she will take prednisone 12 mg a day.  Regarding her allergies she will be on Astelin, Singulair, Flonase.  Regarding her gout she will be taking allopurinol 100 mg daily and use colchicine only as needed.  Recheck blood pressure next week.  Meanwhile today check CBC and CMP.  11/03/20 Patient is here today for follow-up.  Her blood  pressure is better.  Her swelling in her legs is actually improved.  She looks stronger today.  She still reports feeling weak and tired however she does appear to be stronger.  Her most recent A1c was 8.1.  She has known congestive heart failure with an ejection fraction of 30%.  She is currently taking BiDil 1/2 tablet twice daily.  She is currently holding bisoprolol due to hypotension.  She is also holding Lasix due to hypotension.  Today on examination she does not appear to be fluid overloaded.  She denies any chest pain.  There is only trace bipedal edema today which is markedly improved from last week.  At that time, my plan was: Patient will be an excellent candidate for [place].  We discussed this.  However she is concerned about bladder infections and yeast infections.  She cannot take metformin due to her kidney issues.  Actos would be a poor option given her heart failure.  She has had a poor appetite and nausea so I do not feel that Trulicity or a GLP-1 is a good option.  This essentially leaves glipizide, Januvia, and [place].  We discussed all of these options and she ultimately elected to try low-dose glipizide extended release 5 mg daily.  I want to see her back in 1 month to review her sugars.  We will increase BiDil to 1 tablet twice daily in an effort to get her on maximum tolerated medical therapy for congestive heart failure.  In 1 month if she is tolerating this and her blood pressure is better, I will add back the bisoprolol.  If she is unable to tolerate the glipizide or if she changes her mind, I have encouraged her to consider Wilder Glade due to the other benefits that it possesses including renal protection and decreasing mortality due to heart failure  12/05/20 Patient is currently on Jardiance, and glipizide extended release 5 mg a day.  Fasting blood sugars have fluctuated between 70 and 170.  2-hour postprandial sugars have fluctuated between 120 and 200.  There have not been any  sugars over 200.  She is not had any blood sugars below 70.  Therefore, all things considered, her blood sugars are reasonably well controlled.  She just started the Jardiance 1 week ago.  Therefore it has not had a chance to take full effect.  She is also gradually weaning down on the prednisone and is dropped to 10 mg a day   Past Medical History:  Diagnosis Date   Abscess of dorsum of right hand 09/22/2020   Acute deep vein thrombosis (DVT) of right lower extremity ([place]) 12/13/2017   Allergic rhinitis    Anemia    Angio-edema    Anxiety    pt denies   Arthritis    Phreesia 10/21/2019   Asthma    Phreesia 10/21/2019   Breast cancer ([place]) 1998   in remission   Cataract    REMOVED   Clostridium difficile colitis 01/14/2018   Clostridium difficile diarrhea 65/04/5463   Complication of anesthesia    "had hard time waking up from it several times" (02/20/2012)   Depression    "some; don't take anything for it" (02/20/2012)   Diverticulosis    DVT (deep venous thrombosis) ([place])    Fibromyalgia 11/2011   GERD (gastroesophageal reflux disease)    Graves disease    Headache(784.0)    "related to allergies; more at different times during the year" (02/20/2012)   Hemorrhoids    Hiatal hernia    back and neck   Hx of adenomatous colonic polyps 04/12/2016   Hypercholesteremia    good cholesterol is high   Hypothyroidism    IBS (irritable bowel syndrome)    Moderate persistent asthma    -FeV1 72% 2011, -IgE 102 2011, CT sinus Neg 2011   Osteoporosis    on reclast yearly   Peripheral vascular disease ([place]) 2019   DVTs   Pneumonia 04/2011; ~ 11/2011   "double; single" (02/20/2012)   Septic olecranon bursitis of right elbow 09/22/2020   Seronegative rheumatoid arthritis ([place])    Dr. Lahoma Rocker   SIRS (systemic inflammatory response syndrome) ([place]) 02/10/2018   Tracheobronchomalacia    Past Surgical History:  Procedure Laterality Date   ABDOMINAL HYSTERECTOMY N/A    Phreesia  10/21/2019   ANTERIOR AND POSTERIOR REPAIR  1990's   APPENDECTOMY     BREAST LUMPECTOMY  1998   left   BREAST SURGERY N/A    Phreesia 10/21/2019   BRONCHIAL WASHINGS  04/05/2020   Procedure: BRONCHIAL WASHINGS;  Surgeon: Freddi Starr, MD;  Location: [facility];  Service: Pulmonary;;   CARPOMETACARPEL ([place]) FUSION OF THUMB WITH AUTOGRAFT FROM RADIUS  ~ 2009   "both thumbs" (02/20/2012)   CATARACT EXTRACTION W/ INTRAOCULAR LENS  IMPLANT, BILATERAL  2012   CERVICAL DISCECTOMY  10/2001   C5-C6   CERVICAL FUSION  2003   C3-C4   CHOLECYSTECTOMY     COLONOSCOPY     DEBRIDEMENT TENNIS ELBOW  ?1970's   right   ESOPHAGOGASTRODUODENOSCOPY     EYE SURGERY N/A    Phreesia 10/21/2019   HYSTERECTOMY     I & D EXTREMITY Right 09/22/2020   Procedure: IRRIGATION AND DEBRIDEMENT RIGHT HAND AND ELBOW;  Surgeon: Marchia Bond, MD;  Location: [facility];  Service: Orthopedics;  Laterality: Right;   KNEE ARTHROPLASTY  ?1990's   "?right; w/cartilage repair" (02/20/2012)   NASAL SEPTUM SURGERY  1980's   POSTERIOR CERVICAL FUSION/FORAMINOTOMY  2004   "failed initial fusion; rewired  anterior neck" (02/20/2012)   REVERSE SHOULDER ARTHROPLASTY Right 02/16/2020   Procedure: REVERSE SHOULDER ARTHROPLASTY;  Surgeon: Marchia Bond, MD;  Location: [facility];  Service: Orthopedics;  Laterality: Right;   SPINE SURGERY N/A    Phreesia 10/21/2019   TONSILLECTOMY  ~ 1953   VESICOVAGINAL FISTULA CLOSURE W/ TAH  1988   VIDEO BRONCHOSCOPY Bilateral 08/23/2016   Procedure: VIDEO BRONCHOSCOPY WITH FLUORO;  Surgeon: Javier Glazier, MD;  Location: [facility];  Service: Cardiopulmonary;  Laterality: Bilateral;   VIDEO BRONCHOSCOPY N/A 04/05/2020   Procedure: VIDEO BRONCHOSCOPY WITHOUT FLUORO;  Surgeon: Freddi Starr, MD;  Location: [facility];  Service: Pulmonary;  Laterality: N/A;   Current Outpatient Medications on File Prior to Visit  Medication Sig Dispense Refill   albuterol (PROVENTIL) (2.5 MG/3ML) 0.083%  nebulizer solution Take 3 mLs (2.5 mg total) by nebulization every 4 (four) hours as needed for wheezing or shortness of breath. 75 mL 12   allopurinol (ZYLOPRIM) 100 MG tablet Take 100 mg by mouth daily.     Alpha-D-Galactosidase (BEANO MELTAWAYS) 450 units TBDP Take by mouth.     Alpha-Lipoic Acid 600 MG TABS Take by mouth.     atorvastatin (LIPITOR) 80 MG tablet Take 1 tablet (80 mg total) by mouth daily.     azelastine (ASTELIN) 0.1 % nasal spray Use in each nostril as directed 30 mL 5   benzonatate (TESSALON) 200 MG capsule Take 1 capsule (200 mg total) by mouth 3 (three) times daily as needed for cough. 30 capsule 0   Blood Glucose Monitoring Suppl (BLOOD GLUCOSE SYSTEM PAK) KIT Please dispense per patient and insurance preference. Use as directed to monitor FSBS 4x . Dx: E11.65 1 kit 0   budesonide (PULMICORT) 0.5 MG/2ML nebulizer solution Take 2 mLs (0.5 mg total) by nebulization 2 (two) times daily as needed. 120 mL 12   Calcium Citrate (CITRACAL PO) Take by mouth.     cetirizine (ZYRTEC) 10 MG tablet Take 1 tablet (10 mg total) by mouth daily. 30 tablet 5   Cetirizine HCl (ZYRTEC ALLERGY CHILDRENS) 10 MG TBDP      cholecalciferol (VITAMIN D) 25 MCG (1000 UNIT) tablet      cholecalciferol (VITAMIN D) 25 MCG (1000 UT) tablet Take 1,000 Units by mouth daily.      Continuous Blood Gluc Receiver (FREESTYLE LIBRE 14 DAY READER) DEVI Apply 1 each topically daily as needed. USE AS DIRECTED TO MONITOR BLOOD GLUCOSE LEVELS. DX: E11.9. 1 each 1   Continuous Blood Gluc Sensor (FREESTYLE LIBRE 14 DAY SENSOR) MISC Apply  1 each topically every 14 (fourteen) days. USE AS DIRECTED TO MONITOR BLOOD GLUCOSE LEVELS. DX: E11.9. 2 each 11   DEXILANT 60 MG capsule TAKE 1 CAPSULE BY MOUTH EACH MORNING 90 capsule 3   diclofenac Sodium (VOLTAREN) 1 % GEL Apply topically 4 (four) times daily.     famotidine (PEPCID) 20 MG tablet Take 20 mg by mouth at bedtime.     fluticasone (FLONASE) 50 MCG/ACT nasal spray USE 2  SPRAYS IN EACH NOSTRIL DAILY 16 g 2   folic acid (FOLVITE) 1 MG tablet Take 1 mg by mouth daily.     furosemide (LASIX) 40 MG tablet Take 1 tablet (40 mg total) by mouth daily. (Patient taking differently: Take 40 mg by mouth daily. Patient states she is taking 20 mg) 30 tablet 1   Gelatin Capsules, Empty, (CAPSULE SIZE 1 LACTOSE) CAPS by Does not apply route.     glipiZIDE (GLIPIZIDE XL) 5 MG 24 hr tablet Take 1 tablet (5 mg total) by mouth daily with breakfast. 30 tablet 3   Glucose Blood (BLOOD GLUCOSE TEST STRIPS) STRP Please dispense per patient and insurance preference. Use as directed to monitor FSBS 4x . Dx: E11.65 150 strip 1   guaiFENesin (MUCINEX) 600 MG 12 hr tablet Take 1 tablet (600 mg total) by mouth 2 (two) times daily as needed for cough or to loosen phlegm. 60 tablet 1   hydrocortisone cream-nystatin cream-zinc oxide Apply topically.     Immune Globulin, Human, (HIZENTRA) 10 GM/50ML SOLN Inject into the skin.     isosorbide-hydrALAZINE (BIDIL) 20-37.5 MG tablet Take 1 tablet by mouth 2 (two) times daily. 60 tablet 3   magnesium oxide (MAG-OX) 400 MG tablet Take 400 mg by mouth daily.     mirtazapine (REMERON) 30 MG tablet      MIRTAZAPINE PO Take by mouth.     montelukast (SINGULAIR) 10 MG tablet Take 1 tablet (10 mg total) by mouth daily. 30 tablet 5   montelukast (SINGULAIR) 10 MG tablet      nystatin ointment (MYCOSTATIN) Apply 1 application topically 2 (two) times daily.     predniSONE (DELTASONE) 10 MG tablet Take 1 tablet (10 mg total) by mouth daily with breakfast. Take with additional 2 tabs of 37m Prednisone to equal 138mdaily. (Patient taking differently: Take 10 mg by mouth daily with breakfast. Take with additional 1 tabs of 77m577mrednisone to equal 177m17mily.) 30 tablet 3   Prenatal Vit-Fe Fumarate-FA (PRENATAL VITAMINS PO) Take by mouth.     Probiotic Product (ALIGN PO) Take by mouth.     Propylene Glycol (SYSTANE COMPLETE) 0.6 % SOLN Place 1 drop into both eyes 2  (two) times daily.     Tiotropium Bromide-Olodaterol (STIOLTO RESPIMAT) 2.5-2.5 MCG/ACT AERS Inhale 2 puffs into the lungs daily. 4 g 5   XARELTO 20 MG TABS tablet TAKE 1 TABLET BY MOUTH DAILY WITH SUPPER 90 tablet 3   EPINEPHrine 0.3 mg/0.3 mL IJ SOAJ injection USE AS DIRECTED BY YOUR PHYSICIAN INTRAMUSCULARLY AS NEEDED FOR ANAPHYLAXIS 2 each 1   [DISCONTINUED] potassium chloride SA (KLOR-CON) 20 MEQ tablet Take 1 tablet (20 mEq total) by mouth daily. (Patient not taking: No sig reported) 30 tablet 3   Current Facility-Administered Medications on File Prior to Visit  Medication Dose Route Frequency Provider Last Rate Last Admin   denosumab (PROLIA) injection 60 mg  60 mg Subcutaneous Q6 months PickSusy Frizzle   60 mg at 12/05/20 1201   Allergies  Allergen Reactions   Baclofen Other (See Comments)    Altered mental status requiring a 3-day hospital stay- patient became unresponsive   Dust Mite Extract Shortness Of Breath and Other (See Comments)    "sneezing" (02/20/2012)   Molds & Smuts Shortness Of Breath   Morphine And Related Hives and Itching   Other Shortness Of Breath and Other (See Comments)    Grass and weeds "sneezing; filled sinuses" (02/20/2012)   Penicillins Rash and Other (See Comments)    "welts" (02/20/2012) Tolerated Ancef 02/16/20.    Rofecoxib Swelling and Other (See Comments)    Vioxx- feet swelling    Shellfish Allergy Anaphylaxis, Shortness Of Breath, Itching and Rash   Shrimp Flavor Anaphylaxis and Other (See Comments)    ALL SHELLFISH, also   Tetracycline Hcl Nausea And Vomiting   Xolair [Omalizumab] Other (See Comments)    Caused Blood clot   Zoledronic Acid Other (See Comments)    Fever, Put in hospital, dr said it was a reaction from a reaction  Other reaction(s): Unknown   Dilaudid [Hydromorphone Hcl] Itching   Levofloxacin Other (See Comments)    GI upset   Oxycodone Hcl Nausea And Vomiting   Paroxetine Nausea And Vomiting and Other (See Comments)     Paxil    Celecoxib Swelling and Other (See Comments)    Feet swelling   Dilaudid [Hydromorphone] Other (See Comments)    "Allergic," per [facility]   Diltiazem Swelling   Diltiazem Hcl Swelling   Hydrocodone-Acetaminophen Nausea And Vomiting and Other (See Comments)    "Allergic," per [facility]   Lactose Intolerance (Gi) Other (See Comments)    Bloating and gas   Morphine Sulfate Other (See Comments)    "Gets loopy"   Oxycodone-Acetaminophen Other (See Comments)    Reaction??   Strawberry Flavor Other (See Comments)    Reaction?   Tree Extract Other (See Comments)    "tested and told I was allergic to it; never experienced a reaction to it" (02/20/2012)   Penicillin G Procaine Rash and Other (See Comments)    Welts, also   Social History   Socioeconomic History   Marital status: Divorced    Spouse name: Not on file   Number of children: 2   Years of education: college   Highest education level: Not on file  Occupational History   Occupation: Disabled    Comment: Retired Engineer, production: RETIRED  Tobacco Use   Smoking status: Never    Passive exposure: Yes   Smokeless tobacco: Never   Tobacco comments:    Parents  Vaping Use   Vaping Use: Never used  Substance and Sexual Activity   Alcohol use: Not Currently    Alcohol/week: 0.0 standard drinks   Drug use: No   Sexual activity: Never  Other Topics Concern   Not on file  Social History Narrative   Patient lives at home alone. Patient  divorced.    Patient has her BS degree.   Right handed.   Caffeine- sometimes coffee.      [facility]:   Born in [place], [place]. She worked as a Copywriter, advertising. She has no pets currently. She does have indoor plants. Previously had mold in her home that was remediated. Carpet was removed.          Social Determinants of Health   Financial Resource Strain: Low Risk    Difficulty of Paying Living Expenses: Not hard at all  Food Insecurity:  No Food  Insecurity   Worried About Charity fundraiser in the Last Year: Never true   Ran Out of Food in the Last Year: Never true  Transportation Needs: No Transportation Needs   Lack of Transportation (Medical): No   Lack of Transportation (Non-Medical): No  Physical Activity: Inactive   Days of Exercise per Week: 0 days   Minutes of Exercise per Session: 0 min  Stress: No Stress Concern Present   Feeling of Stress : Not at all  Social Connections: Moderately Isolated   Frequency of Communication with Friends and Family: More than three times a week   Frequency of Social Gatherings with Friends and Family: More than three times a week   Attends Religious Services: Never   Marine scientist or Organizations: Yes   Attends Archivist Meetings: Never   Marital Status: Divorced  Human resources officer Violence: Not At Risk   Fear of Current or Ex-Partner: No   Emotionally Abused: No   Physically Abused: No   Sexually Abused: No     Review of Systems     Objective:   Physical Exam Vitals reviewed.  Constitutional:      General: She is not in acute distress.    Appearance: Normal appearance. She is not ill-appearing or toxic-appearing.  HENT:     Right Ear: Tympanic membrane and ear canal normal.     Left Ear: Tympanic membrane and ear canal normal.     Nose: No congestion or rhinorrhea.  Cardiovascular:     Rate and Rhythm: Normal rate and regular rhythm.     Heart sounds: Normal heart sounds.  Pulmonary:     Effort: Pulmonary effort is normal. No respiratory distress.     Breath sounds: No stridor or decreased air movement. Wheezing present. No rales.  Abdominal:     General: Bowel sounds are normal. There is no distension.     Palpations: Abdomen is soft.     Tenderness: There is no abdominal tenderness. There is no guarding.  Musculoskeletal:     Right lower leg: Edema present.     Left lower leg: Edema present.  Skin:    Findings: No erythema.  Neurological:      Mental Status: She is alert and oriented to person, place, and time.     Cranial Nerves: No cranial nerve deficit.     Sensory: No sensory deficit.     Motor: No weakness.     Coordination: Coordination normal.        Assessment & Plan:  Type 2 diabetes mellitus with other specified complication, unspecified whether long term insulin use (HCC)  HFrEF (heart failure with reduced ejection fraction) ([place]) - Plan: empagliflozin (JARDIANCE) 10 MG TABS tablet I reviewed the most recent renal function from the 10th, her creatinine was down to 1.2 essentially.  This is outstanding for this patient.  Liver function tests were also better.  Her reported blood sugars sound fairly well controlled.  I am happy with this patient if I can keep her blood sugars between 102 100.  Therefore we will not make any changes at the present time.  I have cautioned her to monitor her blood sugars.  If she starts to see hypoglycemic episodes less than 70, I want her to reduce glipizide to 2.5 mg a day.  I anticipate that this may happen as she weans down further on the prednisone.  We may ultimately have to discontinue the glipizide as she weans  down on the prednisone.  Otherwise recheck hemoglobin A1c around Christmas

## 2020-12-05 NOTE — Telephone Encounter (Signed)
Pt called in stating that she will need a new prescription sent in for her walker. Pt stated that [facility] will need a DX code on the prescription for the walker. Please fax new prescription over to;  [facility], Fax number [phone]

## 2020-12-06 NOTE — Telephone Encounter (Signed)
Order transcribed

## 2020-12-07 ENCOUNTER — Ambulatory Visit: Payer: Medicare PPO | Admitting: Podiatry

## 2020-12-07 DIAGNOSIS — I5042 Chronic combined systolic (congestive) and diastolic (congestive) heart failure: Secondary | ICD-10-CM | POA: Diagnosis not present

## 2020-12-07 DIAGNOSIS — M71121 Other infective bursitis, right elbow: Secondary | ICD-10-CM | POA: Diagnosis not present

## 2020-12-07 DIAGNOSIS — E1122 Type 2 diabetes mellitus with diabetic chronic kidney disease: Secondary | ICD-10-CM | POA: Diagnosis not present

## 2020-12-07 DIAGNOSIS — K219 Gastro-esophageal reflux disease without esophagitis: Secondary | ICD-10-CM | POA: Diagnosis not present

## 2020-12-07 DIAGNOSIS — F32A Depression, unspecified: Secondary | ICD-10-CM | POA: Diagnosis not present

## 2020-12-07 DIAGNOSIS — N1832 Chronic kidney disease, stage 3b: Secondary | ICD-10-CM | POA: Diagnosis not present

## 2020-12-07 DIAGNOSIS — E1151 Type 2 diabetes mellitus with diabetic peripheral angiopathy without gangrene: Secondary | ICD-10-CM | POA: Diagnosis not present

## 2020-12-07 DIAGNOSIS — M069 Rheumatoid arthritis, unspecified: Secondary | ICD-10-CM | POA: Diagnosis not present

## 2020-12-07 DIAGNOSIS — J4550 Severe persistent asthma, uncomplicated: Secondary | ICD-10-CM | POA: Diagnosis not present

## 2020-12-08 ENCOUNTER — Telehealth: Payer: Self-pay | Admitting: Family Medicine

## 2020-12-08 NOTE — Telephone Encounter (Signed)
Call placed to [facility]. No answer. No VM.

## 2020-12-08 NOTE — Telephone Encounter (Signed)
Received call from [place] with [facility] to request verbal orders to continue PT twice a week for three (3) more weeks effective 12/12/20. Please advise at [phone]

## 2020-12-12 NOTE — Telephone Encounter (Signed)
VO given via VM.

## 2020-12-12 NOTE — Telephone Encounter (Signed)
Call placed to patient. LMTRC.  

## 2020-12-14 DIAGNOSIS — J45909 Unspecified asthma, uncomplicated: Secondary | ICD-10-CM | POA: Diagnosis not present

## 2020-12-14 DIAGNOSIS — J471 Bronchiectasis with (acute) exacerbation: Secondary | ICD-10-CM | POA: Diagnosis not present

## 2020-12-14 DIAGNOSIS — G4733 Obstructive sleep apnea (adult) (pediatric): Secondary | ICD-10-CM | POA: Diagnosis not present

## 2020-12-14 DIAGNOSIS — M0600 Rheumatoid arthritis without rheumatoid factor, unspecified site: Secondary | ICD-10-CM | POA: Diagnosis not present

## 2020-12-14 DIAGNOSIS — G894 Chronic pain syndrome: Secondary | ICD-10-CM | POA: Diagnosis not present

## 2020-12-14 DIAGNOSIS — R0602 Shortness of breath: Secondary | ICD-10-CM | POA: Diagnosis not present

## 2020-12-14 DIAGNOSIS — J4540 Moderate persistent asthma, uncomplicated: Secondary | ICD-10-CM | POA: Diagnosis not present

## 2020-12-14 DIAGNOSIS — G4736 Sleep related hypoventilation in conditions classified elsewhere: Secondary | ICD-10-CM | POA: Diagnosis not present

## 2020-12-14 DIAGNOSIS — I5032 Chronic diastolic (congestive) heart failure: Secondary | ICD-10-CM | POA: Diagnosis not present

## 2020-12-15 ENCOUNTER — Other Ambulatory Visit (HOSPITAL_COMMUNITY): Payer: Self-pay

## 2020-12-15 ENCOUNTER — Telehealth: Payer: Self-pay | Admitting: Allergy and Immunology

## 2020-12-15 DIAGNOSIS — F32A Depression, unspecified: Secondary | ICD-10-CM | POA: Diagnosis not present

## 2020-12-15 DIAGNOSIS — N1832 Chronic kidney disease, stage 3b: Secondary | ICD-10-CM | POA: Diagnosis not present

## 2020-12-15 DIAGNOSIS — E1122 Type 2 diabetes mellitus with diabetic chronic kidney disease: Secondary | ICD-10-CM | POA: Diagnosis not present

## 2020-12-15 DIAGNOSIS — M069 Rheumatoid arthritis, unspecified: Secondary | ICD-10-CM | POA: Diagnosis not present

## 2020-12-15 DIAGNOSIS — J4550 Severe persistent asthma, uncomplicated: Secondary | ICD-10-CM | POA: Diagnosis not present

## 2020-12-15 DIAGNOSIS — I5042 Chronic combined systolic (congestive) and diastolic (congestive) heart failure: Secondary | ICD-10-CM | POA: Diagnosis not present

## 2020-12-15 DIAGNOSIS — E1151 Type 2 diabetes mellitus with diabetic peripheral angiopathy without gangrene: Secondary | ICD-10-CM | POA: Diagnosis not present

## 2020-12-15 DIAGNOSIS — M71121 Other infective bursitis, right elbow: Secondary | ICD-10-CM | POA: Diagnosis not present

## 2020-12-15 DIAGNOSIS — K219 Gastro-esophageal reflux disease without esophagitis: Secondary | ICD-10-CM | POA: Diagnosis not present

## 2020-12-15 NOTE — Telephone Encounter (Signed)
Please inform [name] that we are going to send on her message to Tammy who works with the infusions to see why it cost her $250 a month.  And, she needs to stay on her current plan until she returns to see me.  She should see me 12 weeks after starting her infusion.  Then we can discuss options.

## 2020-12-15 NOTE — Telephone Encounter (Signed)
Patient had a couple of questions for Dr. Neldon Mc.   Because of her anemia that was filed under Medicare Part D she is having to pay $250 a month for her infusions. She has to pay this even though all her copays have been paid for this year Patient is wondering if the way it was filed (through diagnosis code) could be changed so her copay wont be so high. Does patient have to take infusions for 12 moths or can it be shortened? Patient has completed 4 of the 52 doses. Patient states the infusions are uncomfortable due to the 3 needles in her stomach area. Patient would like to know if it could be switched to IV.  Please advise  Best contact number: [phone]

## 2020-12-16 DIAGNOSIS — E1151 Type 2 diabetes mellitus with diabetic peripheral angiopathy without gangrene: Secondary | ICD-10-CM | POA: Diagnosis not present

## 2020-12-16 DIAGNOSIS — J4550 Severe persistent asthma, uncomplicated: Secondary | ICD-10-CM | POA: Diagnosis not present

## 2020-12-16 DIAGNOSIS — E1122 Type 2 diabetes mellitus with diabetic chronic kidney disease: Secondary | ICD-10-CM | POA: Diagnosis not present

## 2020-12-16 DIAGNOSIS — I5042 Chronic combined systolic (congestive) and diastolic (congestive) heart failure: Secondary | ICD-10-CM | POA: Diagnosis not present

## 2020-12-16 DIAGNOSIS — N1832 Chronic kidney disease, stage 3b: Secondary | ICD-10-CM | POA: Diagnosis not present

## 2020-12-16 DIAGNOSIS — K219 Gastro-esophageal reflux disease without esophagitis: Secondary | ICD-10-CM | POA: Diagnosis not present

## 2020-12-16 DIAGNOSIS — M069 Rheumatoid arthritis, unspecified: Secondary | ICD-10-CM | POA: Diagnosis not present

## 2020-12-16 DIAGNOSIS — F32A Depression, unspecified: Secondary | ICD-10-CM | POA: Diagnosis not present

## 2020-12-16 DIAGNOSIS — M71121 Other infective bursitis, right elbow: Secondary | ICD-10-CM | POA: Diagnosis not present

## 2020-12-17 ENCOUNTER — Other Ambulatory Visit: Payer: Self-pay | Admitting: Family Medicine

## 2020-12-19 ENCOUNTER — Telehealth: Payer: Self-pay | Admitting: Family Medicine

## 2020-12-19 NOTE — Telephone Encounter (Signed)
Called patient and advised to keep checking with [facility] for patient assistance programs and stay on therapy till 12 weeks followup. Unfortunately we cannot change Dx code and even under part B would be 20 % copay probably the same

## 2020-12-19 NOTE — Telephone Encounter (Signed)
Patient called to follow up on question; asked if she can stop taking her blood sugar level in the morning since her reading has consistently been under 100. Please advise at [phone]

## 2020-12-19 NOTE — Telephone Encounter (Signed)
Please advise 

## 2020-12-20 ENCOUNTER — Other Ambulatory Visit (HOSPITAL_COMMUNITY): Payer: Self-pay

## 2020-12-20 ENCOUNTER — Telehealth: Payer: Self-pay

## 2020-12-20 DIAGNOSIS — I5042 Chronic combined systolic (congestive) and diastolic (congestive) heart failure: Secondary | ICD-10-CM | POA: Diagnosis not present

## 2020-12-20 DIAGNOSIS — E1122 Type 2 diabetes mellitus with diabetic chronic kidney disease: Secondary | ICD-10-CM | POA: Diagnosis not present

## 2020-12-20 DIAGNOSIS — E1151 Type 2 diabetes mellitus with diabetic peripheral angiopathy without gangrene: Secondary | ICD-10-CM | POA: Diagnosis not present

## 2020-12-20 DIAGNOSIS — F32A Depression, unspecified: Secondary | ICD-10-CM | POA: Diagnosis not present

## 2020-12-20 DIAGNOSIS — M069 Rheumatoid arthritis, unspecified: Secondary | ICD-10-CM | POA: Diagnosis not present

## 2020-12-20 DIAGNOSIS — N1832 Chronic kidney disease, stage 3b: Secondary | ICD-10-CM | POA: Diagnosis not present

## 2020-12-20 DIAGNOSIS — M71121 Other infective bursitis, right elbow: Secondary | ICD-10-CM | POA: Diagnosis not present

## 2020-12-20 DIAGNOSIS — K219 Gastro-esophageal reflux disease without esophagitis: Secondary | ICD-10-CM | POA: Diagnosis not present

## 2020-12-20 DIAGNOSIS — J4550 Severe persistent asthma, uncomplicated: Secondary | ICD-10-CM | POA: Diagnosis not present

## 2020-12-20 NOTE — Telephone Encounter (Signed)
[place] w/[facility] is calling with request for continued OT, 2xweek for 4 weeks and then 1xweek for 3 weeks, starting 12/26/20.  Please advise. Thank you!

## 2020-12-20 NOTE — Telephone Encounter (Signed)
Pt called to advise her BP has been running low. Pt states home therapy was out today and they told her their reading was low, pt unsure what they got. Pt took BP while on phone and reading was 95/43, HR 82. Pt reports BP this morning prior to meds was 122/54 , HR 82. Pt is inquiring if any changes need to be made to her meds.   Please advise. Thank you!

## 2020-12-21 DIAGNOSIS — I5020 Unspecified systolic (congestive) heart failure: Secondary | ICD-10-CM | POA: Diagnosis not present

## 2020-12-21 LAB — LIPID PANEL
Chol/HDL Ratio: 2.5 ratio (ref 0.0–4.4)
Cholesterol, Total: 162 mg/dL (ref 100–199)
HDL: 64 mg/dL (ref >39)
LDL Chol Calc (NIH): 64 mg/dL (ref 0–99)
Triglycerides: 210 mg/dL — ABNORMAL HIGH (ref 0–149)
VLDL Cholesterol Cal: 34 mg/dL (ref 5–40)

## 2020-12-21 LAB — COMPREHENSIVE METABOLIC PANEL
ALT: 55 IU/L — ABNORMAL HIGH (ref 0–32)
AST: 52 IU/L — ABNORMAL HIGH (ref 0–40)
Albumin/Globulin Ratio: 1.7 (ref 1.2–2.2)
Albumin: 3.8 g/dL (ref 3.7–4.7)
Alkaline Phosphatase: 117 IU/L (ref 44–121)
BUN/Creatinine Ratio: 26 (ref 12–28)
BUN: 30 mg/dL — ABNORMAL HIGH (ref 8–27)
Bilirubin Total: 0.3 mg/dL (ref 0.0–1.2)
CO2: 24 mmol/L (ref 20–29)
Calcium: 9.4 mg/dL (ref 8.7–10.3)
Chloride: 105 mmol/L (ref 96–106)
Creatinine, Ser: 1.15 mg/dL — ABNORMAL HIGH (ref 0.57–1.00)
Globulin, Total: 2.2 g/dL (ref 1.5–4.5)
Glucose: 86 mg/dL (ref 70–99)
Potassium: 4.3 mmol/L (ref 3.5–5.2)
Sodium: 143 mmol/L (ref 134–144)
Total Protein: 6 g/dL (ref 6.0–8.5)
eGFR: 51 mL/min/{1.73_m2} — ABNORMAL LOW (ref >59)

## 2020-12-21 NOTE — Telephone Encounter (Signed)
Received call from [place], [facility] OT with [facility].   Requested to extend [facility] OT services 2x weekly x4 weeks, 1x weekly x3 weeks for ADL training, BUE strengthening, fall prevention and dyspnea management.   VO given.

## 2020-12-21 NOTE — Telephone Encounter (Signed)
LVM for HH to return call.

## 2020-12-21 NOTE — Telephone Encounter (Signed)
Patient returned call and made aware.

## 2020-12-21 NOTE — Telephone Encounter (Signed)
Spoke with pt and reviewed recommendations, s/s of hypotension (pt denies any current symptoms) and for her to contact cardiology team with any further questions or concerns related to her blood pressure/medications. Pt voiced understanding. Nothing further needed at this time.

## 2020-12-23 DIAGNOSIS — I5042 Chronic combined systolic (congestive) and diastolic (congestive) heart failure: Secondary | ICD-10-CM | POA: Diagnosis not present

## 2020-12-23 DIAGNOSIS — E1122 Type 2 diabetes mellitus with diabetic chronic kidney disease: Secondary | ICD-10-CM | POA: Diagnosis not present

## 2020-12-23 DIAGNOSIS — M069 Rheumatoid arthritis, unspecified: Secondary | ICD-10-CM | POA: Diagnosis not present

## 2020-12-23 DIAGNOSIS — K219 Gastro-esophageal reflux disease without esophagitis: Secondary | ICD-10-CM | POA: Diagnosis not present

## 2020-12-23 DIAGNOSIS — N1832 Chronic kidney disease, stage 3b: Secondary | ICD-10-CM | POA: Diagnosis not present

## 2020-12-23 DIAGNOSIS — E1151 Type 2 diabetes mellitus with diabetic peripheral angiopathy without gangrene: Secondary | ICD-10-CM | POA: Diagnosis not present

## 2020-12-23 DIAGNOSIS — M71121 Other infective bursitis, right elbow: Secondary | ICD-10-CM | POA: Diagnosis not present

## 2020-12-23 DIAGNOSIS — F32A Depression, unspecified: Secondary | ICD-10-CM | POA: Diagnosis not present

## 2020-12-23 DIAGNOSIS — J4550 Severe persistent asthma, uncomplicated: Secondary | ICD-10-CM | POA: Diagnosis not present

## 2020-12-27 ENCOUNTER — Other Ambulatory Visit (HOSPITAL_COMMUNITY): Payer: Self-pay

## 2020-12-27 ENCOUNTER — Telehealth: Payer: Self-pay | Admitting: Family Medicine

## 2020-12-27 ENCOUNTER — Other Ambulatory Visit: Payer: Self-pay | Admitting: Allergy and Immunology

## 2020-12-27 NOTE — Telephone Encounter (Signed)
Patient called to ask if provider wants her to continue taking glipiZIDE (GLIPIZIDE XL) 5 MG 24 hr tablet [[id number]]   Since she doesn't have diabetes.  Please advise today at [phone].

## 2020-12-27 NOTE — Telephone Encounter (Signed)
Call placed to patient.   Advised that recently reported blood sugars are WNL while on Glipizide. Advised that as she decreases Prednisone, her blood sugars may decrease, and will be evidenced by: Fast heartbeat Shaking Sweating Nervousness or anxiety Irritability or confusion Dizziness Hunger  Advised that if Sx are noted, check FSBS. Also advised to randomly check blood sugars to make sure fasting sugars are >70. Also advised A1C could be checked to monitor.   Advised if Sx present, or if low blood sugars noted, medication can be stopped.   Verbalized understanding. States that she will monitor for Sx and use random CBG's fasting.

## 2020-12-28 ENCOUNTER — Ambulatory Visit (INDEPENDENT_AMBULATORY_CARE_PROVIDER_SITE_OTHER): Payer: Medicare PPO | Admitting: Podiatry

## 2020-12-28 ENCOUNTER — Other Ambulatory Visit: Payer: Self-pay

## 2020-12-28 DIAGNOSIS — I5042 Chronic combined systolic (congestive) and diastolic (congestive) heart failure: Secondary | ICD-10-CM | POA: Diagnosis not present

## 2020-12-28 DIAGNOSIS — N1832 Chronic kidney disease, stage 3b: Secondary | ICD-10-CM | POA: Diagnosis not present

## 2020-12-28 DIAGNOSIS — E1151 Type 2 diabetes mellitus with diabetic peripheral angiopathy without gangrene: Secondary | ICD-10-CM | POA: Diagnosis not present

## 2020-12-28 DIAGNOSIS — B351 Tinea unguium: Secondary | ICD-10-CM | POA: Diagnosis not present

## 2020-12-28 DIAGNOSIS — M79674 Pain in right toe(s): Secondary | ICD-10-CM | POA: Diagnosis not present

## 2020-12-28 DIAGNOSIS — K219 Gastro-esophageal reflux disease without esophagitis: Secondary | ICD-10-CM | POA: Diagnosis not present

## 2020-12-28 DIAGNOSIS — E1122 Type 2 diabetes mellitus with diabetic chronic kidney disease: Secondary | ICD-10-CM | POA: Diagnosis not present

## 2020-12-28 DIAGNOSIS — M069 Rheumatoid arthritis, unspecified: Secondary | ICD-10-CM | POA: Diagnosis not present

## 2020-12-28 DIAGNOSIS — J4550 Severe persistent asthma, uncomplicated: Secondary | ICD-10-CM | POA: Diagnosis not present

## 2020-12-28 DIAGNOSIS — M79675 Pain in left toe(s): Secondary | ICD-10-CM

## 2020-12-28 DIAGNOSIS — M71121 Other infective bursitis, right elbow: Secondary | ICD-10-CM | POA: Diagnosis not present

## 2020-12-28 DIAGNOSIS — F32A Depression, unspecified: Secondary | ICD-10-CM | POA: Diagnosis not present

## 2020-12-28 DIAGNOSIS — M1712 Unilateral primary osteoarthritis, left knee: Secondary | ICD-10-CM | POA: Diagnosis not present

## 2020-12-29 ENCOUNTER — Other Ambulatory Visit (HOSPITAL_COMMUNITY): Payer: Self-pay

## 2020-12-29 ENCOUNTER — Telehealth: Payer: Self-pay | Admitting: Family Medicine

## 2020-12-29 MED ORDER — ZINC OXIDE 20 % EX OINT
TOPICAL_OINTMENT | Freq: Three times a day (TID) | CUTANEOUS | 6 refills | Status: DC | PRN
  Filled 2020-12-29 – 2020-12-30 (×2): qty 240, 10d supply, fill #0

## 2020-12-29 NOTE — Telephone Encounter (Signed)
GERHARDTS BUTT CREAM  Prescription sent to pharmacy. Call placed to patient and patient made aware.

## 2020-12-29 NOTE — Telephone Encounter (Signed)
Returned patient's call; patient left voicemail to request cvall back regarding Rx that hasn't gone through for gehart (to treat hemorrhoids). Patient requesting call back from nurse or provider. Please advise at [phone].

## 2020-12-30 ENCOUNTER — Other Ambulatory Visit (HOSPITAL_COMMUNITY): Payer: Self-pay

## 2020-12-30 MED ORDER — GERHARDT'S BUTT CREAM
TOPICAL_CREAM | CUTANEOUS | 6 refills | Status: DC
  Filled 2020-12-30: qty 4, 10d supply, fill #0
  Filled 2020-12-30: qty 240, 10d supply, fill #0
  Filled 2021-09-04 (×2): qty 4, 10d supply, fill #1

## 2021-01-04 ENCOUNTER — Encounter: Payer: Self-pay | Admitting: Podiatry

## 2021-01-04 NOTE — Progress Notes (Signed)
  Subjective:  Patient ID: Ebony Scott, female    DOB: [birth year],  MRN: [id number]  Chief Complaint  Patient presents with   Foot Pain    Right foot    70 y.o. female returns for the above complaint.  Patient presents with thickened elongated dystrophic toenails x10.  She had surgery done by me in the past which she has resolved greatly with.  She would like to have her nails debrided down she is not able to do her self.  Is hurting her with ambulation.  Objective:  There were no vitals filed for this visit. Podiatric Exam: Vascular: dorsalis pedis and posterior tibial pulses are palpable bilateral. Capillary return is immediate. Temperature gradient is WNL. Skin turgor WNL  Sensorium: Decreased Semmes Weinstein monofilament test.  Decreased tactile sensation bilaterally. Nail Exam: Pt has thick disfigured discolored nails with subungual debris noted bilateral entire nail hallux through fifth toenails.  Pain on palpation to the nails. Ulcer Exam: There is no evidence of ulcer or pre-ulcerative changes or infection. Orthopedic Exam: Muscle tone and strength are WNL. No limitations in general ROM. No crepitus or effusions noted.  Skin: No Porokeratosis. No infection or ulcers    Assessment & Plan:   1. Pain due to onychomycosis of toenails of both feet     Patient was evaluated and treated and all questions answered.  Onychomycosis with pain  -Nails palliatively debrided as below. -Educated on self-care  Procedure: Nail Debridement Rationale: pain  Type of Debridement: manual, sharp debridement. Instrumentation: Nail nipper, rotary burr. Number of Nails: 10  Procedures and Treatment: Consent by patient was obtained for treatment procedures. The patient understood the discussion of treatment and procedures well. All questions were answered thoroughly reviewed. Debridement of mycotic and hypertrophic toenails, 1 through 5 bilateral and clearing of subungual debris. No ulceration,  no infection noted.  Return Visit-Office Procedure: Patient instructed to return to the office for a follow up visit 3 months for continued evaluation and treatment.  Boneta Lucks, DPM    No follow-ups on file.

## 2021-01-06 ENCOUNTER — Encounter (HOSPITAL_BASED_OUTPATIENT_CLINIC_OR_DEPARTMENT_OTHER): Payer: Self-pay | Admitting: Cardiology

## 2021-01-06 ENCOUNTER — Ambulatory Visit (INDEPENDENT_AMBULATORY_CARE_PROVIDER_SITE_OTHER): Payer: Medicare PPO | Admitting: Cardiology

## 2021-01-06 ENCOUNTER — Other Ambulatory Visit: Payer: Self-pay

## 2021-01-06 VITALS — BP 132/60 | HR 71 | Ht 61.0 in | Wt 126.8 lb

## 2021-01-06 DIAGNOSIS — I2510 Atherosclerotic heart disease of native coronary artery without angina pectoris: Secondary | ICD-10-CM | POA: Diagnosis not present

## 2021-01-06 DIAGNOSIS — E782 Mixed hyperlipidemia: Secondary | ICD-10-CM

## 2021-01-06 DIAGNOSIS — I5042 Chronic combined systolic (congestive) and diastolic (congestive) heart failure: Secondary | ICD-10-CM

## 2021-01-06 DIAGNOSIS — I429 Cardiomyopathy, unspecified: Secondary | ICD-10-CM

## 2021-01-06 NOTE — Progress Notes (Signed)
Cardiology Office Note:    Date:  01/10/2021   ID:  MICHAEL VENTRESCA, DOB 07-24-1950, MRN [id number]  PCP:  [name], MD  Cardiologist:  Buford Dresser, MD PhD  Referring MD: [name], MD   CC: follow up  History of Present Illness:    Ebony Scott is a 70 y.o. 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 Initial consult was 10/15/17 for incidental coronary artery calcification seen on CT scan.  Cardiac history: had CT chest 07/2017 which noted diffuse calcification, including aorta, aortic branch vessels, and coronary arteries. Had a normal Lexiscan in the past. No chest pain, main longstanding symptom is shortness of breath, followed by pulmonary. No history of clinical ASCVD events. History of DVT/PE. New diagnose of cardiomyopathy 08/2020.  Today: She is accompanied by her daughter, who is a Marine scientist. Overall she is feeling the same, and is not improving. She is experiencing fatigue, slow reaction times, and frequent lacerations or tears on her skin.  She is scheduled for a cardiac catheterization 01/11/2021. We reviewed the procedural process she can expect and potential next steps.  Lately she has had mild LE edema.  She denies any palpitations, chest pain, or shortness of breath. No lightheadedness, headaches, syncope, orthopnea, or PND.   Past Medical History:  Diagnosis Date   Abscess of dorsum of right hand 09/22/2020   Acute deep vein thrombosis (DVT) of right lower extremity ([place]) 12/13/2017   Allergic rhinitis    Anemia    Angio-edema    Anxiety    pt denies   Arthritis    Phreesia 10/21/2019   Asthma    Phreesia 10/21/2019   Breast cancer ([place]) 1998   in remission   Cataract    REMOVED   Clostridium difficile colitis 01/14/2018    Clostridium difficile diarrhea 50/27/7412   Complication of anesthesia    "had hard time waking up from it several times" (02/20/2012)   Depression    "some; don't take anything for it" (02/20/2012)   Diverticulosis    DVT (deep venous thrombosis) ([place])    Fibromyalgia 11/2011   GERD (gastroesophageal reflux disease)    Graves disease    Headache(784.0)    "related to allergies; more at different times during the year" (02/20/2012)   Hemorrhoids    Hiatal hernia    back and neck   Hx of adenomatous colonic polyps 04/12/2016   Hypercholesteremia    good cholesterol is high   Hypothyroidism    IBS (irritable bowel syndrome)    Moderate persistent asthma    -FeV1 72% 2011, -IgE 102 2011, CT sinus Neg 2011   Osteoporosis    on reclast yearly   Peripheral vascular disease ([place]) 2019   DVTs   Pneumonia 04/2011; ~ 11/2011   "double; single" (02/20/2012)   Septic olecranon bursitis of right elbow 09/22/2020   Seronegative rheumatoid arthritis ([place])    Dr. Lahoma Rocker   SIRS (systemic inflammatory response syndrome) ([place]) 02/10/2018   Tracheobronchomalacia     Past Surgical History:  Procedure Laterality Date   ABDOMINAL HYSTERECTOMY N/A    Phreesia 10/21/2019   ANTERIOR AND POSTERIOR REPAIR  1990's   APPENDECTOMY     BREAST LUMPECTOMY  1998   left   BREAST SURGERY N/A    Phreesia 10/21/2019   BRONCHIAL  WASHINGS  04/05/2020   Procedure: BRONCHIAL WASHINGS;  Surgeon: Freddi Starr, MD;  Location: [facility];  Service: Pulmonary;;   CARPOMETACARPEL ([place]) FUSION OF THUMB WITH AUTOGRAFT FROM RADIUS  ~ 2009   "both thumbs" (02/20/2012)   CATARACT EXTRACTION W/ INTRAOCULAR LENS  IMPLANT, BILATERAL  2012   CERVICAL DISCECTOMY  10/2001   C5-C6   CERVICAL FUSION  2003   C3-C4   CHOLECYSTECTOMY     COLONOSCOPY     DEBRIDEMENT TENNIS ELBOW  ?1970's   right   ESOPHAGOGASTRODUODENOSCOPY     EYE SURGERY N/A    Phreesia 10/21/2019   HYSTERECTOMY     I & D EXTREMITY Right 09/22/2020    Procedure: IRRIGATION AND DEBRIDEMENT RIGHT HAND AND ELBOW;  Surgeon: Marchia Bond, MD;  [facility]: [facility];  Service: Orthopedics;  Laterality: Right;   KNEE ARTHROPLASTY  ?1990's   "?right; w/cartilage repair" (02/20/2012)   NASAL SEPTUM SURGERY  1980's   POSTERIOR CERVICAL FUSION/FORAMINOTOMY  2004   "failed initial fusion; rewired  anterior neck" (02/20/2012)   REVERSE SHOULDER ARTHROPLASTY Right 02/16/2020   Procedure: REVERSE SHOULDER ARTHROPLASTY;  Surgeon: Marchia Bond, MD;  [facility];  Service: Orthopedics;  Laterality: Right;   SPINE SURGERY N/A    Phreesia 10/21/2019   TONSILLECTOMY  ~ 1953   VESICOVAGINAL FISTULA CLOSURE W/ TAH  1988   VIDEO BRONCHOSCOPY Bilateral 08/23/2016   Procedure: VIDEO BRONCHOSCOPY WITH FLUORO;  Surgeon: Javier Glazier, MD;  [facility];  Service: Cardiopulmonary;  Laterality: Bilateral;   VIDEO BRONCHOSCOPY N/A 04/05/2020   Procedure: VIDEO BRONCHOSCOPY WITHOUT FLUORO;  Surgeon: Freddi Starr, MD;  [facility];  Service: Pulmonary;  Laterality: N/A;    Current Medications: Current Outpatient Medications on File Prior to Visit  Medication Sig   acetaminophen (TYLENOL) 650 MG CR tablet Take 1,300 mg by mouth every 8 (eight) hours as needed for pain.   albuterol (PROVENTIL) (2.5 MG/3ML) 0.083% nebulizer solution Take 3 mLs (2.5 mg total) by nebulization every 4 (four) hours as needed for wheezing or shortness of breath.   albuterol (VENTOLIN HFA) 108 (90 Base) MCG/ACT inhaler Inhale 2 puffs into the lungs every 6 (six) hours as needed for wheezing or shortness of breath.   allopurinol (ZYLOPRIM) 100 MG tablet Take 100 mg by mouth daily.   Alpha-D-Galactosidase (BEANO PO) Take 2 tablets by mouth 3 (three) times daily with meals.   Alpha-Lipoic Acid 600 MG TABS Take 600 mg by mouth daily.   atorvastatin (LIPITOR) 80 MG tablet TAKE 1 TABLET BY MOUTH DAILY   azelastine (ASTELIN) 0.1 % nasal spray Use in each nostril as  directed (Patient taking differently: 2 sprays daily as needed for allergies.)   benzonatate (TESSALON) 200 MG capsule Take 1 capsule (200 mg total) by mouth 3 (three) times daily as needed for cough. (Patient taking differently: Take 200 mg by mouth 2 (two) times daily as needed for cough.)   Blood Glucose Monitoring Suppl (BLOOD GLUCOSE SYSTEM PAK) KIT Please dispense per patient and insurance preference. Use as directed to monitor FSBS 4x . Dx: E11.65   budesonide (PULMICORT) 0.5 MG/2ML nebulizer solution Take 2 mLs (0.5 mg total) by nebulization 2 (two) times daily as needed.   CALCIUM-MAGNESIUM PO Take 1 tablet by mouth daily.   cetirizine (ZYRTEC) 10 MG tablet Take 1 tablet (10 mg total) by mouth daily.   cholecalciferol (VITAMIN D) 25 MCG (1000 UNIT) tablet 1,000 Units daily.   Continuous Blood Gluc Receiver (FREESTYLE  LIBRE 14 DAY READER) DEVI Apply 1 each topically daily as needed. USE AS DIRECTED TO MONITOR BLOOD GLUCOSE LEVELS. DX: E11.9.   Continuous Blood Gluc Sensor (FREESTYLE LIBRE 14 DAY SENSOR) MISC Apply 1 each topically every 14 (fourteen) days. USE AS DIRECTED TO MONITOR BLOOD GLUCOSE LEVELS. DX: E11.9.   DEXILANT 60 MG capsule TAKE 1 CAPSULE BY MOUTH EACH MORNING   diclofenac Sodium (VOLTAREN) 1 % GEL Apply 1 application topically 4 (four) times daily as needed (pain).   empagliflozin (JARDIANCE) 10 MG TABS tablet Take 1 tablet (10 mg total) by mouth daily before breakfast.   EPINEPHrine 0.3 mg/0.3 mL IJ SOAJ injection USE AS DIRECTED BY YOUR PHYSICIAN INTRAMUSCULARLY AS NEEDED FOR ANAPHYLAXIS   famotidine (PEPCID) 20 MG tablet Take 1 tablet (20 mg total) by mouth daily.   fluticasone (FLONASE) 50 MCG/ACT nasal spray USE 2 SPRAYS IN EACH NOSTRIL DAILY (Patient taking differently: 2 sprays daily as needed for allergies.)   folic acid (FOLVITE) 1 MG tablet Take 1 mg by mouth daily.   furosemide (LASIX) 40 MG tablet Take 1 tablet (40 mg total) by mouth daily.   Gelatin Capsules,  Empty, (CAPSULE SIZE 1 LACTOSE) CAPS Take 3 tablets by mouth daily as needed (consuming dairy).   glipiZIDE (GLIPIZIDE XL) 5 MG 24 hr tablet Take 1 tablet (5 mg total) by mouth daily with breakfast.   Glucose Blood (BLOOD GLUCOSE TEST STRIPS) STRP Please dispense per patient and insurance preference. Use as directed to monitor FSBS 4x . Dx: E11.65   guaiFENesin (MUCINEX) 600 MG 12 hr tablet Take 1 tablet (600 mg total) by mouth 2 (two) times daily as needed for cough or to loosen phlegm. (Patient taking differently: Take 600 mg by mouth 2 (two) times daily.)   HIZENTRA 2 GM/10ML SOLN Inject 1 Dose into the skin once a week.   isosorbide-hydrALAZINE (BIDIL) 20-37.5 MG tablet Take 1 tablet by mouth 2 (two) times daily.   magnesium oxide (MAG-OX) 400 MG tablet Take 400 mg by mouth daily.   mirtazapine (REMERON) 30 MG tablet Take 30 mg by mouth at bedtime as needed (sleep).   montelukast (SINGULAIR) 10 MG tablet Take 1 tablet (10 mg total) by mouth daily.   Nystatin (GERHARDT'S BUTT CREAM) CREA Apply topically 3 times a day as needed for itching or irritation   nystatin (MYCOSTATIN) 100000 UNIT/ML suspension Use as directed 15 mLs in the mouth or throat at bedtime as needed (mouth sores).   nystatin ointment (MYCOSTATIN) Apply 1 application topically 2 (two) times daily as needed (yeast).   predniSONE (DELTASONE) 10 MG tablet Take 1 tablet (10 mg total) by mouth daily with breakfast. Take with additional 2 tabs of $Remov'1mg'EqYnVE$  Prednisone to equal $Remove'12mg'urXLeOs$  daily. (Patient taking differently: Take 10 mg by mouth daily with breakfast.)   Prenatal Vit-Fe Fumarate-FA (PRENATAL VITAMINS PO) Take 1 tablet by mouth daily.   PRESCRIPTION MEDICATION Place 4 drops into both ears See admin instructions. Chloramp- sulfu - ampho d-h droc 100-100-5 hydrocortisone, otic ear drops. Instill 4 drops in to each ear daily for the first 5 days of each month   Probiotic Product (ALIGN PO) Take 1 capsule by mouth daily.   Propylene Glycol  (SYSTANE COMPLETE) 0.6 % SOLN Place 1 drop into both eyes 2 (two) times daily.   Tiotropium Bromide-Olodaterol (STIOLTO RESPIMAT) 2.5-2.5 MCG/ACT AERS Inhale 2 puffs into the lungs daily.   traMADol (ULTRAM) 50 MG tablet Take 1 tablet (50 mg total) by mouth at bedtime as  needed. (Patient taking differently: Take 50 mg by mouth 2 (two) times daily as needed for moderate pain.)   XARELTO 20 MG TABS tablet TAKE 1 TABLET BY MOUTH DAILY WITH SUPPER   [DISCONTINUED] potassium chloride SA (KLOR-CON) 20 MEQ tablet Take 1 tablet (20 mEq total) by mouth daily. (Patient not taking: No sig reported)   Current Facility-Administered Medications on File Prior to Visit  Medication   denosumab (PROLIA) injection 60 mg     Allergies:   Baclofen, Dust mite extract, Molds & smuts, Morphine and related, Other, Penicillins, Rofecoxib, Shellfish allergy, Tetracycline hcl, Xolair [omalizumab], Zoledronic acid, Dilaudid [hydromorphone hcl], Levofloxacin, Oxycodone hcl, Paroxetine, Celecoxib, Diltiazem hcl, Hydrocodone-acetaminophen, Lactose intolerance (gi), Morphine sulfate, Oxycodone-acetaminophen, Rituximab, Tree extract, and Penicillin g procaine   Social History   Tobacco Use   Smoking status: Never    Passive exposure: Yes   Smokeless tobacco: Never   Tobacco comments:    Parents  Vaping Use   Vaping Use: Never used  Substance Use Topics   Alcohol use: Not Currently    Alcohol/week: 0.0 standard drinks   Drug use: No    Family History: The patient's family history includes Allergies in her father and mother; Arthritis in her father and mother; Colitis in her daughter; Colon cancer in an other family member; Diabetes in her maternal grandfather and mother; Heart disease in her father and mother; Lung cancer in her mother; Stroke in her father. Father had 4V CABG and valve replacement at age 71; had a complication with ruptured aorta after surgery and passed away. Mother had lung cancer and diabetes. No  other significant heart disease or risks.   ROS:   Please see the history of present illness.   (+) Anemia (+) Fatigue (+) Skin lacerations  (+) Slow reaction time (+) LE edema Additional pertinent ROS otherwise unremarkable.  EKGs/Labs/Other Studies Reviewed:    The following studies were reviewed today:  Echo 09/16/20 1. Septal apical and anterior wall hypokinesis EF decreased compard to TTE done 07/10/19 . Left ventricular ejection fraction, by estimation, is 35 to 40%. The left ventricle has moderately decreased function. The left ventricle demonstrates regional wall motion abnormalities (see scoring diagram/findings for description). The left ventricular internal cavity size was moderately dilated. Left ventricular diastolic parameters are consistent with Grade II diastolic dysfunction (pseudonormalization). Elevated left ventricular end-diastolic pressure.   2. Right ventricular systolic function is normal. The right ventricular size is normal.   3. Left atrial size was mildly dilated.   4. The mitral valve is abnormal. Mild mitral valve regurgitation. No evidence of mitral stenosis.   5. The aortic valve is tricuspid. There is moderate calcification of the aortic valve. Aortic valve regurgitation is not visualized. Mild to moderate aortic valve sclerosis/calcification is present, without any evidence of aortic stenosis.   6. The inferior vena cava is normal in size with greater than 50% respiratory variability, suggesting right atrial pressure of 3 mmHg.   FINDINGS   Left Ventricle: Septal apical and anterior wall hypokinesis EF decreased compard to TTE done 07/10/19.  CT Chest 08/23/2020: COMPARISON:  05/25/2020   FINDINGS: Cardiovascular: Aortic atherosclerosis. Tortuous thoracic aorta. Mild cardiomegaly. Three vessel coronary artery calcification. Aortic valve calcification.   Mediastinum/Nodes: No axillary adenopathy. No mediastinal or definite hilar adenopathy, given  limitations of unenhanced CT.   Lungs/Pleura: New trace right-sided pleural fluid. Left-sided pleural thickening or trace fluid, similar.   Areas of mild smooth septal thickening are most apparent at the lung bases. Example  99/3.   Probable scarring in the left lower lobe medially. The nodular component measures 1.5 x 1.2 cm on 109/3 . (Previously 1.9 x 1.6 cm.)   Subtle subpleural interstitial thickening within the anterior left upper lobe may be radiation induced given adjacent left breast surgical changes.   Posterior right upper lobe pulmonary nodule is less distinct today at 4 mm on 60/3 . (Previously 7 mm).   Anterior right upper lobe pulmonary nodule described on the prior exam is resolved.   Inferior right middle lobe volume loss including on 94/3 is less nodular today.   Upper Abdomen: Cholecystectomy. Normal imaged portions of the liver, spleen, stomach, pancreas, adrenal glands, kidneys.)   Musculoskeletal: Surgical clips and dystrophic calcification in the lateral left breast. Right shoulder arthroplasty. Remote bilateral rib fractures are again identified.   IMPRESSION: 1. New trace right pleural fluid and left pleural fluid or thickening. Concurrent new mild pulmonary interlobular septal thickening. Constellation of findings suggests mild pulmonary edema/fluid overload. 2. No other explanation for patient's acute symptoms. 3. Decrease and resolution of pulmonary nodules, currently with morphology favoring scarring. 4. Coronary artery atherosclerosis. Aortic Atherosclerosis (ICD10-I70.0). 5. Aortic valvular calcifications. Consider echocardiography to evaluate for valvular dysfunction.  Echo 07/10/19 1. Hyperdynamic LV function; grade 1 diastolic dysfunction; moderate LVH;  trace AI.   2. Left ventricular ejection fraction, by estimation, is >75%. The left  ventricle has hyperdynamic function. The left ventricle has no regional  wall motion abnormalities.  There is moderate left ventricular hypertrophy.  Left ventricular diastolic  parameters are consistent with Grade I diastolic dysfunction (impaired  relaxation).   3. Right ventricular systolic function is normal. The right ventricular  size is normal.   4. The mitral valve is normal in structure. No evidence of mitral valve  regurgitation. No evidence of mitral stenosis.   5. The aortic valve has an indeterminant number of cusps. Aortic valve  regurgitation is trivial. No aortic stenosis is present.  Echo 08/05/17 Study Conclusions   - Left ventricle: The cavity size was normal. There was mild focal   basal hypertrophy of the septum. Systolic function was vigorous.   The estimated ejection fraction was in the range of 65% to 70%.   Wall motion was normal; there were no regional wall motion   abnormalities. Doppler parameters are consistent with abnormal   left ventricular relaxation (grade 1 diastolic dysfunction).   Impressions:   - Definity used; vigorous LV systolic function; mild diastolic   dysfunction.  CT chest high resolution 6.17.19 FINDINGS: Cardiovascular: Heart size is normal. There is no significant pericardial fluid, thickening or pericardial calcification. There is aortic atherosclerosis, as well as atherosclerosis of the great vessels of the mediastinum and the coronary arteries, including calcified atherosclerotic plaque in the left main, left anterior descending left circumflex coronary arteries.  IMPRESSION: 1. No findings to suggest interstitial lung disease. 2. Mild air trapping indicative of mild small airways disease. 3. Aortic atherosclerosis, in addition to left main and 2 vessel coronary artery disease. Please note that although the presence of coronary artery calcium documents the presence of coronary artery disease, the severity of this disease and any potential stenosis cannot be assessed on this non-gated CT examination. Assessment  for potential risk factor modification, dietary therapy or pharmacologic therapy may be warranted, if clinically indicated. 4. Additional incidental findings, as above.  Stress 2011:  Stress echo 11/2009 Study Conclusions - Stress ECG conclusions: There were no stress arrhythmias or conduction abnormalities. The stress ECG was  normal. - Staged echo: There was no echocardiographic evidence for stress-induced ischemia. Impressions: - Nondiagnostic stress echocardiogram with nochest pain, no ST changes; patient did not achieve target heart rate; no   stress-induced wall motion abnormalities.  Had [place] 12/2009 due to nondiagnostic stress echo: Stress Procedure  The patient received IV Lexiscan 0.4 mg over 15-seconds with concurrent low level exercise and then Technetium 70m Tetrofosmin was injected at 30-seconds while the patient continued walking one more minute.  There were no significant changes with Lexiscan.  Quantitative spect images were obtained after a 45 minute delay.   QPS Raw Data Images:  Normal; no motion artifact; normal heart/lung ratio. Stress Images:  Normal homogeneous uptake in all areas of the myocardium. Rest Images:  Normal homogeneous uptake in all areas of the myocardium. Subtraction (SDS):  No evidence of ischemia. Transient Ischemic Dilatation:  1.15  (Normal <1.22)  Lung/Heart Ratio:  .27  (Normal <0.45)   Quantitative Gated Spect Images QGS EDV:  52 ml QGS ESV:  13 ml QGS EF:  75 % QGS cine images:  Normal motion   Findings Normal nuclear study  EKG:  EKG is personally reviewed today.  01/06/2021: NSR at 71 bpm, moderate LVH criteria, PRWP 10/26/20: NSR at 80 bpm, PRWP, borderline LVH 02/23/20: sinus tachycardia a 106 bpm, lack of anterior forces V1/V2  Recent Labs: 09/23/2020: TSH 0.983 10/04/2020: Magnesium 3.1 10/09/2020: B Natriuretic Peptide 3,823.5 12/21/2020: ALT 55; BUN 30; Creatinine, Ser 1.15; Potassium 4.3; Sodium 143 01/09/2021: Hemoglobin  9.9; Platelets 260   Recent Lipid Panel    Component Value Date/Time   CHOL 162 12/21/2020 0826   TRIG 210 (H) 12/21/2020 0826   HDL 64 12/21/2020 0826   CHOLHDL 2.5 12/21/2020 0826   CHOLHDL 8.4 09/23/2020 1253   VLDL UNABLE TO CALCULATE IF TRIGLYCERIDE OVER 400 mg/dL 09/23/2020 1253   LDLCALC 64 12/21/2020 0826   LDLCALC 77 07/04/2018 1017   LDLDIRECT 141.2 (H) 09/23/2020 1253    Physical Exam:    VS:  BP 132/60   Pulse 71   Ht $R'5\' 1"'HA$  (1.549 m)   Wt 126 lb 12.8 oz (57.5 kg)   SpO2 97%   BMI 23.96 kg/m     Wt Readings from Last 3 Encounters:  01/06/21 126 lb 12.8 oz (57.5 kg)  12/05/20 127 lb (57.6 kg)  11/30/20 129 lb 1.6 oz (58.6 kg)    GEN: frail appearing, on O2 by nasal cannula, dyspneic with minimal exertion HEENT: Normal, moist mucous membranes NECK: No JVD CARDIAC: regular rhythm, normal S1 and S2, no rubs or gallops. No murmur. VASCULAR: Radial and DP pulses 2+ bilaterally. No carotid bruits RESPIRATORY:  Bronchial breath sounds with diffuse coarseness ABDOMEN: Soft, non-tender, non-distended MUSCULOSKELETAL:  Ambulates independently with walker SKIN: Warm and dry, bilateral trivial ankle edema NEUROLOGIC:  Alert and oriented x 3. No focal neuro deficits noted. PSYCHIATRIC:  Normal affect   ASSESSMENT:    1. Cardiomyopathy, unspecified type ([place])   2. Coronary artery calcification seen on CT scan   3. Chronic combined systolic and diastolic heart failure ([place])   4. Mixed hyperlipidemia    PLAN:    Unspecified cardiomyopathy Prior hospitalization for acute renal failure, acute systolic and diastolic heart failure -echo 06/2019 with hyperdynamic EF and only grade 1 diastolic dysfunction, then 08/2020 with reduced EF and focal wall motion abnormalities -mild LE edema today -counseled on heart failure education -current medications: furosemide 20 mg daily, empagliflozin 10 mg daily -not taking isordil-hydralazine currently -on  follow up, based on cath,  BP, and renal function, will discuss additional GDMT. Most agents had been on hold due to renal failure, but Cr improved back to baseline.  Coronary calcium seen on CT scan: suggests CAD equivalent -asymptomatic -prior negative nuclear stress -now with reduced EF and wall motion changes -planned for cath 11/23 Shared Decision Making/Informed Consent The risks [stroke (1 in 1000), death (1 in 998-05-02), kidney failure [usually temporary] (1 in 500), bleeding (1 in 200), allergic reaction [possibly serious] (1 in 200)], benefits (diagnostic support and management of coronary artery disease) and alternatives of a cardiac catheterization were discussed in detail with Ms. Probasco and she is willing to proceed.  -had recent BMET but not CBC. Has known chronic anemia. Will make sure this is stable. -continue atorvastatin 80 mg daily  Mixed hyperlipidemia with goal LDL <70 -lipids from 12/21/20 reviewed. LSL 64, TG 210 -continue atorvastatin 80 mg  Secondary prevention: -recommend heart healthy/Mediterranean diet, with whole grains, fruits, vegetable, fish, lean meats, nuts, and olive oil. Limit salt. -recommend moderate walking, 3-5 times/week for 30-50 minutes each session. Aim for at least 150 minutes.week. Goal should be pace of 3 miles/hours, or walking 1.5 miles in 30 minutes -recommend avoidance of tobacco products. Avoid excess alcohol.  Plan for follow up: 4 weeks with APP to discuss Cath results  Buford Dresser, MD, PhD, [facility]   Medication Adjustments/Labs and Tests Ordered: Current medicines are reviewed at length with the patient today.  Concerns regarding medicines are outlined above.   Orders Placed This Encounter  Procedures   CBC   EKG 12-Lead    No orders of the defined types were placed in this encounter.  Patient Instructions  Medication Instructions:  Your Physician recommend you continue on your current medication as directed.     *If  you need a refill on your cardiac medications before your next appointment, please call your pharmacy*   Lab Work: Your provider has recommended lab work Monday (CBC). Please have this collected at [facility] [place]. The lab is open 8:00 am - 4:30 pm. Please avoid 12:00p - 1:00p for lunch hour. You do not need an appointment. Please go to [address] [place], [place] [phone]. This is in the Primary Care office on the 3rd floor, let them know you are there for blood work and they will direct you to the lab.   If you have labs (blood work) drawn today and your tests are completely normal, you will receive your results only by: [place] (if you have MyChart) OR A paper copy in the mail If you have any lab test that is abnormal or we need to change your treatment, we will call you to review the results.   Testing/Procedures: None ordered today   Follow-Up: At [facility] Center, you and your health needs are our priority.  As part of our continuing mission to provide you with exceptional heart care, we have created designated Provider Care Teams.  These Care Teams include your primary Cardiologist (physician) and Advanced Practice Providers (APPs -  Physician Assistants and Nurse Practitioners) who all work together to provide you with the care you need, when you need it.  We recommend signing up for the patient portal called "MyChart".  Sign up information is provided on this After Visit Summary.  MyChart is used to connect with patients for Virtual Visits (Telemedicine).  Patients are able to view lab/test results, encounter notes,  upcoming appointments, etc.  Non-urgent messages can be sent to your provider as well.   To learn more about what you can do with MyChart, go to [email or web address].    Your next appointment:   3 -4 week(s)  The format for your next appointment:   In Person  Provider:   Buford Dresser, MD       [facility]  [place],acting as a scribe for Buford Dresser, MD.,have documented all relevant documentation on the behalf of Buford Dresser, MD,as directed by  Buford Dresser, MD while in the presence of Buford Dresser, MD.  I, Buford Dresser, MD, have reviewed all documentation for this visit. The documentation on 01/10/21 for the exam, diagnosis, procedures, and orders are all accurate and complete.   Signed, Buford Dresser, MD PhD 01/10/2021   [place]

## 2021-01-06 NOTE — H&P (View-Only) (Signed)
Cardiology Office Note:    Date:  01/10/2021   ID:  JENINE KRISHER, DOB 03-26-1950, MRN [id number]  PCP:  [name], MD  Cardiologist:  Buford Dresser, MD PhD  Referring MD: [name], MD   CC: follow up  History of Present Illness:    SAN RUA is a 69 y.o. 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 Initial consult was 10/15/17 for incidental coronary artery calcification seen on CT scan.  Cardiac history: had CT chest 07/2017 which noted diffuse calcification, including aorta, aortic branch vessels, and coronary arteries. Had a normal Lexiscan in the past. No chest pain, main longstanding symptom is shortness of breath, followed by pulmonary. No history of clinical ASCVD events. History of DVT/PE. New diagnose of cardiomyopathy 08/2020.  Today: She is accompanied by her daughter, who is a Marine scientist. Overall she is feeling the same, and is not improving. She is experiencing fatigue, slow reaction times, and frequent lacerations or tears on her skin.  She is scheduled for a cardiac catheterization 01/11/2021. We reviewed the procedural process she can expect and potential next steps.  Lately she has had mild LE edema.  She denies any palpitations, chest pain, or shortness of breath. No lightheadedness, headaches, syncope, orthopnea, or PND.   Past Medical History:  Diagnosis Date   Abscess of dorsum of right hand 09/22/2020   Acute deep vein thrombosis (DVT) of right lower extremity ([place]) 12/13/2017   Allergic rhinitis    Anemia    Angio-edema    Anxiety    pt denies   Arthritis    Phreesia 10/21/2019   Asthma    Phreesia 10/21/2019   Breast cancer ([place]) 1998   in remission   Cataract    REMOVED   Clostridium difficile colitis 01/14/2018    Clostridium difficile diarrhea 88/41/6606   Complication of anesthesia    "had hard time waking up from it several times" (02/20/2012)   Depression    "some; don't take anything for it" (02/20/2012)   Diverticulosis    DVT (deep venous thrombosis) ([place])    Fibromyalgia 11/2011   GERD (gastroesophageal reflux disease)    Graves disease    Headache(784.0)    "related to allergies; more at different times during the year" (02/20/2012)   Hemorrhoids    Hiatal hernia    back and neck   Hx of adenomatous colonic polyps 04/12/2016   Hypercholesteremia    good cholesterol is high   Hypothyroidism    IBS (irritable bowel syndrome)    Moderate persistent asthma    -FeV1 72% 2011, -IgE 102 2011, CT sinus Neg 2011   Osteoporosis    on reclast yearly   Peripheral vascular disease ([place]) 2019   DVTs   Pneumonia 04/2011; ~ 11/2011   "double; single" (02/20/2012)   Septic olecranon bursitis of right elbow 09/22/2020   Seronegative rheumatoid arthritis ([place])    Dr. Lahoma Rocker   SIRS (systemic inflammatory response syndrome) ([place]) 02/10/2018   Tracheobronchomalacia     Past Surgical History:  Procedure Laterality Date   ABDOMINAL HYSTERECTOMY N/A    Phreesia 10/21/2019   ANTERIOR AND POSTERIOR REPAIR  1990's   APPENDECTOMY     BREAST LUMPECTOMY  1998   left   BREAST SURGERY N/A    Phreesia 10/21/2019   BRONCHIAL  WASHINGS  04/05/2020   Procedure: BRONCHIAL WASHINGS;  Surgeon: Freddi Starr, MD;  Location: [facility];  Service: Pulmonary;;   CARPOMETACARPEL ([place]) FUSION OF THUMB WITH AUTOGRAFT FROM RADIUS  ~ 2009   "both thumbs" (02/20/2012)   CATARACT EXTRACTION W/ INTRAOCULAR LENS  IMPLANT, BILATERAL  2012   CERVICAL DISCECTOMY  10/2001   C5-C6   CERVICAL FUSION  2003   C3-C4   CHOLECYSTECTOMY     COLONOSCOPY     DEBRIDEMENT TENNIS ELBOW  ?1970's   right   ESOPHAGOGASTRODUODENOSCOPY     EYE SURGERY N/A    Phreesia 10/21/2019   HYSTERECTOMY     I & D EXTREMITY Right 09/22/2020    Procedure: IRRIGATION AND DEBRIDEMENT RIGHT HAND AND ELBOW;  Surgeon: Marchia Bond, MD;  [facility]: [facility];  Service: Orthopedics;  Laterality: Right;   KNEE ARTHROPLASTY  ?1990's   "?right; w/cartilage repair" (02/20/2012)   NASAL SEPTUM SURGERY  1980's   POSTERIOR CERVICAL FUSION/FORAMINOTOMY  2004   "failed initial fusion; rewired  anterior neck" (02/20/2012)   REVERSE SHOULDER ARTHROPLASTY Right 02/16/2020   Procedure: REVERSE SHOULDER ARTHROPLASTY;  Surgeon: Marchia Bond, MD;  [facility];  Service: Orthopedics;  Laterality: Right;   SPINE SURGERY N/A    Phreesia 10/21/2019   TONSILLECTOMY  ~ 1953   VESICOVAGINAL FISTULA CLOSURE W/ TAH  1988   VIDEO BRONCHOSCOPY Bilateral 08/23/2016   Procedure: VIDEO BRONCHOSCOPY WITH FLUORO;  Surgeon: Javier Glazier, MD;  [facility];  Service: Cardiopulmonary;  Laterality: Bilateral;   VIDEO BRONCHOSCOPY N/A 04/05/2020   Procedure: VIDEO BRONCHOSCOPY WITHOUT FLUORO;  Surgeon: Freddi Starr, MD;  [facility];  Service: Pulmonary;  Laterality: N/A;    Current Medications: Current Outpatient Medications on File Prior to Visit  Medication Sig   acetaminophen (TYLENOL) 650 MG CR tablet Take 1,300 mg by mouth every 8 (eight) hours as needed for pain.   albuterol (PROVENTIL) (2.5 MG/3ML) 0.083% nebulizer solution Take 3 mLs (2.5 mg total) by nebulization every 4 (four) hours as needed for wheezing or shortness of breath.   albuterol (VENTOLIN HFA) 108 (90 Base) MCG/ACT inhaler Inhale 2 puffs into the lungs every 6 (six) hours as needed for wheezing or shortness of breath.   allopurinol (ZYLOPRIM) 100 MG tablet Take 100 mg by mouth daily.   Alpha-D-Galactosidase (BEANO PO) Take 2 tablets by mouth 3 (three) times daily with meals.   Alpha-Lipoic Acid 600 MG TABS Take 600 mg by mouth daily.   atorvastatin (LIPITOR) 80 MG tablet TAKE 1 TABLET BY MOUTH DAILY   azelastine (ASTELIN) 0.1 % nasal spray Use in each nostril as  directed (Patient taking differently: 2 sprays daily as needed for allergies.)   benzonatate (TESSALON) 200 MG capsule Take 1 capsule (200 mg total) by mouth 3 (three) times daily as needed for cough. (Patient taking differently: Take 200 mg by mouth 2 (two) times daily as needed for cough.)   Blood Glucose Monitoring Suppl (BLOOD GLUCOSE SYSTEM PAK) KIT Please dispense per patient and insurance preference. Use as directed to monitor FSBS 4x . Dx: E11.65   budesonide (PULMICORT) 0.5 MG/2ML nebulizer solution Take 2 mLs (0.5 mg total) by nebulization 2 (two) times daily as needed.   CALCIUM-MAGNESIUM PO Take 1 tablet by mouth daily.   cetirizine (ZYRTEC) 10 MG tablet Take 1 tablet (10 mg total) by mouth daily.   cholecalciferol (VITAMIN D) 25 MCG (1000 UNIT) tablet 1,000 Units daily.   Continuous Blood Gluc Receiver (FREESTYLE  LIBRE 14 DAY READER) DEVI Apply 1 each topically daily as needed. USE AS DIRECTED TO MONITOR BLOOD GLUCOSE LEVELS. DX: E11.9.   Continuous Blood Gluc Sensor (FREESTYLE LIBRE 14 DAY SENSOR) MISC Apply 1 each topically every 14 (fourteen) days. USE AS DIRECTED TO MONITOR BLOOD GLUCOSE LEVELS. DX: E11.9.   DEXILANT 60 MG capsule TAKE 1 CAPSULE BY MOUTH EACH MORNING   diclofenac Sodium (VOLTAREN) 1 % GEL Apply 1 application topically 4 (four) times daily as needed (pain).   empagliflozin (JARDIANCE) 10 MG TABS tablet Take 1 tablet (10 mg total) by mouth daily before breakfast.   EPINEPHrine 0.3 mg/0.3 mL IJ SOAJ injection USE AS DIRECTED BY YOUR PHYSICIAN INTRAMUSCULARLY AS NEEDED FOR ANAPHYLAXIS   famotidine (PEPCID) 20 MG tablet Take 1 tablet (20 mg total) by mouth daily.   fluticasone (FLONASE) 50 MCG/ACT nasal spray USE 2 SPRAYS IN EACH NOSTRIL DAILY (Patient taking differently: 2 sprays daily as needed for allergies.)   folic acid (FOLVITE) 1 MG tablet Take 1 mg by mouth daily.   furosemide (LASIX) 40 MG tablet Take 1 tablet (40 mg total) by mouth daily.   Gelatin Capsules,  Empty, (CAPSULE SIZE 1 LACTOSE) CAPS Take 3 tablets by mouth daily as needed (consuming dairy).   glipiZIDE (GLIPIZIDE XL) 5 MG 24 hr tablet Take 1 tablet (5 mg total) by mouth daily with breakfast.   Glucose Blood (BLOOD GLUCOSE TEST STRIPS) STRP Please dispense per patient and insurance preference. Use as directed to monitor FSBS 4x . Dx: E11.65   guaiFENesin (MUCINEX) 600 MG 12 hr tablet Take 1 tablet (600 mg total) by mouth 2 (two) times daily as needed for cough or to loosen phlegm. (Patient taking differently: Take 600 mg by mouth 2 (two) times daily.)   HIZENTRA 2 GM/10ML SOLN Inject 1 Dose into the skin once a week.   isosorbide-hydrALAZINE (BIDIL) 20-37.5 MG tablet Take 1 tablet by mouth 2 (two) times daily.   magnesium oxide (MAG-OX) 400 MG tablet Take 400 mg by mouth daily.   mirtazapine (REMERON) 30 MG tablet Take 30 mg by mouth at bedtime as needed (sleep).   montelukast (SINGULAIR) 10 MG tablet Take 1 tablet (10 mg total) by mouth daily.   Nystatin (GERHARDT'S BUTT CREAM) CREA Apply topically 3 times a day as needed for itching or irritation   nystatin (MYCOSTATIN) 100000 UNIT/ML suspension Use as directed 15 mLs in the mouth or throat at bedtime as needed (mouth sores).   nystatin ointment (MYCOSTATIN) Apply 1 application topically 2 (two) times daily as needed (yeast).   predniSONE (DELTASONE) 10 MG tablet Take 1 tablet (10 mg total) by mouth daily with breakfast. Take with additional 2 tabs of $Remov'1mg'YZxCgT$  Prednisone to equal $Remove'12mg'sWoGDWf$  daily. (Patient taking differently: Take 10 mg by mouth daily with breakfast.)   Prenatal Vit-Fe Fumarate-FA (PRENATAL VITAMINS PO) Take 1 tablet by mouth daily.   PRESCRIPTION MEDICATION Place 4 drops into both ears See admin instructions. Chloramp- sulfu - ampho d-h droc 100-100-5 hydrocortisone, otic ear drops. Instill 4 drops in to each ear daily for the first 5 days of each month   Probiotic Product (ALIGN PO) Take 1 capsule by mouth daily.   Propylene Glycol  (SYSTANE COMPLETE) 0.6 % SOLN Place 1 drop into both eyes 2 (two) times daily.   Tiotropium Bromide-Olodaterol (STIOLTO RESPIMAT) 2.5-2.5 MCG/ACT AERS Inhale 2 puffs into the lungs daily.   traMADol (ULTRAM) 50 MG tablet Take 1 tablet (50 mg total) by mouth at bedtime as  needed. (Patient taking differently: Take 50 mg by mouth 2 (two) times daily as needed for moderate pain.)   XARELTO 20 MG TABS tablet TAKE 1 TABLET BY MOUTH DAILY WITH SUPPER   [DISCONTINUED] potassium chloride SA (KLOR-CON) 20 MEQ tablet Take 1 tablet (20 mEq total) by mouth daily. (Patient not taking: No sig reported)   Current Facility-Administered Medications on File Prior to Visit  Medication   denosumab (PROLIA) injection 60 mg     Allergies:   Baclofen, Dust mite extract, Molds & smuts, Morphine and related, Other, Penicillins, Rofecoxib, Shellfish allergy, Tetracycline hcl, Xolair [omalizumab], Zoledronic acid, Dilaudid [hydromorphone hcl], Levofloxacin, Oxycodone hcl, Paroxetine, Celecoxib, Diltiazem hcl, Hydrocodone-acetaminophen, Lactose intolerance (gi), Morphine sulfate, Oxycodone-acetaminophen, Rituximab, Tree extract, and Penicillin g procaine   Social History   Tobacco Use   Smoking status: Never    Passive exposure: Yes   Smokeless tobacco: Never   Tobacco comments:    Parents  Vaping Use   Vaping Use: Never used  Substance Use Topics   Alcohol use: Not Currently    Alcohol/week: 0.0 standard drinks   Drug use: No    Family History: The patient's family history includes Allergies in her father and mother; Arthritis in her father and mother; Colitis in her daughter; Colon cancer in an other family member; Diabetes in her maternal grandfather and mother; Heart disease in her father and mother; Lung cancer in her mother; Stroke in her father. Father had 4V CABG and valve replacement at age 21; had a complication with ruptured aorta after surgery and passed away. Mother had lung cancer and diabetes. No  other significant heart disease or risks.   ROS:   Please see the history of present illness.   (+) Anemia (+) Fatigue (+) Skin lacerations  (+) Slow reaction time (+) LE edema Additional pertinent ROS otherwise unremarkable.  EKGs/Labs/Other Studies Reviewed:    The following studies were reviewed today:  Echo 09/16/20 1. Septal apical and anterior wall hypokinesis EF decreased compard to TTE done 07/10/19 . Left ventricular ejection fraction, by estimation, is 35 to 40%. The left ventricle has moderately decreased function. The left ventricle demonstrates regional wall motion abnormalities (see scoring diagram/findings for description). The left ventricular internal cavity size was moderately dilated. Left ventricular diastolic parameters are consistent with Grade II diastolic dysfunction (pseudonormalization). Elevated left ventricular end-diastolic pressure.   2. Right ventricular systolic function is normal. The right ventricular size is normal.   3. Left atrial size was mildly dilated.   4. The mitral valve is abnormal. Mild mitral valve regurgitation. No evidence of mitral stenosis.   5. The aortic valve is tricuspid. There is moderate calcification of the aortic valve. Aortic valve regurgitation is not visualized. Mild to moderate aortic valve sclerosis/calcification is present, without any evidence of aortic stenosis.   6. The inferior vena cava is normal in size with greater than 50% respiratory variability, suggesting right atrial pressure of 3 mmHg.   FINDINGS   Left Ventricle: Septal apical and anterior wall hypokinesis EF decreased compard to TTE done 07/10/19.  CT Chest 08/23/2020: COMPARISON:  05/25/2020   FINDINGS: Cardiovascular: Aortic atherosclerosis. Tortuous thoracic aorta. Mild cardiomegaly. Three vessel coronary artery calcification. Aortic valve calcification.   Mediastinum/Nodes: No axillary adenopathy. No mediastinal or definite hilar adenopathy, given  limitations of unenhanced CT.   Lungs/Pleura: New trace right-sided pleural fluid. Left-sided pleural thickening or trace fluid, similar.   Areas of mild smooth septal thickening are most apparent at the lung bases. Example  99/3.   Probable scarring in the left lower lobe medially. The nodular component measures 1.5 x 1.2 cm on 109/3 . (Previously 1.9 x 1.6 cm.)   Subtle subpleural interstitial thickening within the anterior left upper lobe may be radiation induced given adjacent left breast surgical changes.   Posterior right upper lobe pulmonary nodule is less distinct today at 4 mm on 60/3 . (Previously 7 mm).   Anterior right upper lobe pulmonary nodule described on the prior exam is resolved.   Inferior right middle lobe volume loss including on 94/3 is less nodular today.   Upper Abdomen: Cholecystectomy. Normal imaged portions of the liver, spleen, stomach, pancreas, adrenal glands, kidneys.)   Musculoskeletal: Surgical clips and dystrophic calcification in the lateral left breast. Right shoulder arthroplasty. Remote bilateral rib fractures are again identified.   IMPRESSION: 1. New trace right pleural fluid and left pleural fluid or thickening. Concurrent new mild pulmonary interlobular septal thickening. Constellation of findings suggests mild pulmonary edema/fluid overload. 2. No other explanation for patient's acute symptoms. 3. Decrease and resolution of pulmonary nodules, currently with morphology favoring scarring. 4. Coronary artery atherosclerosis. Aortic Atherosclerosis (ICD10-I70.0). 5. Aortic valvular calcifications. Consider echocardiography to evaluate for valvular dysfunction.  Echo 07/10/19 1. Hyperdynamic LV function; grade 1 diastolic dysfunction; moderate LVH;  trace AI.   2. Left ventricular ejection fraction, by estimation, is >75%. The left  ventricle has hyperdynamic function. The left ventricle has no regional  wall motion abnormalities.  There is moderate left ventricular hypertrophy.  Left ventricular diastolic  parameters are consistent with Grade I diastolic dysfunction (impaired  relaxation).   3. Right ventricular systolic function is normal. The right ventricular  size is normal.   4. The mitral valve is normal in structure. No evidence of mitral valve  regurgitation. No evidence of mitral stenosis.   5. The aortic valve has an indeterminant number of cusps. Aortic valve  regurgitation is trivial. No aortic stenosis is present.  Echo 08/05/17 Study Conclusions   - Left ventricle: The cavity size was normal. There was mild focal   basal hypertrophy of the septum. Systolic function was vigorous.   The estimated ejection fraction was in the range of 65% to 70%.   Wall motion was normal; there were no regional wall motion   abnormalities. Doppler parameters are consistent with abnormal   left ventricular relaxation (grade 1 diastolic dysfunction).   Impressions:   - Definity used; vigorous LV systolic function; mild diastolic   dysfunction.  CT chest high resolution 6.17.19 FINDINGS: Cardiovascular: Heart size is normal. There is no significant pericardial fluid, thickening or pericardial calcification. There is aortic atherosclerosis, as well as atherosclerosis of the great vessels of the mediastinum and the coronary arteries, including calcified atherosclerotic plaque in the left main, left anterior descending left circumflex coronary arteries.  IMPRESSION: 1. No findings to suggest interstitial lung disease. 2. Mild air trapping indicative of mild small airways disease. 3. Aortic atherosclerosis, in addition to left main and 2 vessel coronary artery disease. Please note that although the presence of coronary artery calcium documents the presence of coronary artery disease, the severity of this disease and any potential stenosis cannot be assessed on this non-gated CT examination. Assessment  for potential risk factor modification, dietary therapy or pharmacologic therapy may be warranted, if clinically indicated. 4. Additional incidental findings, as above.  Stress 2011:  Stress echo 11/2009 Study Conclusions - Stress ECG conclusions: There were no stress arrhythmias or conduction abnormalities. The stress ECG was  normal. - Staged echo: There was no echocardiographic evidence for stress-induced ischemia. Impressions: - Nondiagnostic stress echocardiogram with nochest pain, no ST changes; patient did not achieve target heart rate; no   stress-induced wall motion abnormalities.  Had [place] 12/2009 due to nondiagnostic stress echo: Stress Procedure  The patient received IV Lexiscan 0.4 mg over 15-seconds with concurrent low level exercise and then Technetium 14m Tetrofosmin was injected at 30-seconds while the patient continued walking one more minute.  There were no significant changes with Lexiscan.  Quantitative spect images were obtained after a 45 minute delay.   QPS Raw Data Images:  Normal; no motion artifact; normal heart/lung ratio. Stress Images:  Normal homogeneous uptake in all areas of the myocardium. Rest Images:  Normal homogeneous uptake in all areas of the myocardium. Subtraction (SDS):  No evidence of ischemia. Transient Ischemic Dilatation:  1.15  (Normal <1.22)  Lung/Heart Ratio:  .27  (Normal <0.45)   Quantitative Gated Spect Images QGS EDV:  52 ml QGS ESV:  13 ml QGS EF:  75 % QGS cine images:  Normal motion   Findings Normal nuclear study  EKG:  EKG is personally reviewed today.  01/06/2021: NSR at 71 bpm, moderate LVH criteria, PRWP 10/26/20: NSR at 80 bpm, PRWP, borderline LVH 02/23/20: sinus tachycardia a 106 bpm, lack of anterior forces V1/V2  Recent Labs: 09/23/2020: TSH 0.983 10/04/2020: Magnesium 3.1 10/09/2020: B Natriuretic Peptide 3,823.5 12/21/2020: ALT 55; BUN 30; Creatinine, Ser 1.15; Potassium 4.3; Sodium 143 01/09/2021: Hemoglobin  9.9; Platelets 260   Recent Lipid Panel    Component Value Date/Time   CHOL 162 12/21/2020 0826   TRIG 210 (H) 12/21/2020 0826   HDL 64 12/21/2020 0826   CHOLHDL 2.5 12/21/2020 0826   CHOLHDL 8.4 09/23/2020 1253   VLDL UNABLE TO CALCULATE IF TRIGLYCERIDE OVER 400 mg/dL 09/23/2020 1253   LDLCALC 64 12/21/2020 0826   LDLCALC 77 07/04/2018 1017   LDLDIRECT 141.2 (H) 09/23/2020 1253    Physical Exam:    VS:  BP 132/60   Pulse 71   Ht $R'5\' 1"'zq$  (1.549 m)   Wt 126 lb 12.8 oz (57.5 kg)   SpO2 97%   BMI 23.96 kg/m     Wt Readings from Last 3 Encounters:  01/06/21 126 lb 12.8 oz (57.5 kg)  12/05/20 127 lb (57.6 kg)  11/30/20 129 lb 1.6 oz (58.6 kg)    GEN: frail appearing, on O2 by nasal cannula, dyspneic with minimal exertion HEENT: Normal, moist mucous membranes NECK: No JVD CARDIAC: regular rhythm, normal S1 and S2, no rubs or gallops. No murmur. VASCULAR: Radial and DP pulses 2+ bilaterally. No carotid bruits RESPIRATORY:  Bronchial breath sounds with diffuse coarseness ABDOMEN: Soft, non-tender, non-distended MUSCULOSKELETAL:  Ambulates independently with walker SKIN: Warm and dry, bilateral trivial ankle edema NEUROLOGIC:  Alert and oriented x 3. No focal neuro deficits noted. PSYCHIATRIC:  Normal affect   ASSESSMENT:    1. Cardiomyopathy, unspecified type ([place])   2. Coronary artery calcification seen on CT scan   3. Chronic combined systolic and diastolic heart failure ([place])   4. Mixed hyperlipidemia    PLAN:    Unspecified cardiomyopathy Prior hospitalization for acute renal failure, acute systolic and diastolic heart failure -echo 06/2019 with hyperdynamic EF and only grade 1 diastolic dysfunction, then 08/2020 with reduced EF and focal wall motion abnormalities -mild LE edema today -counseled on heart failure education -current medications: furosemide 20 mg daily, empagliflozin 10 mg daily -not taking isordil-hydralazine currently -on  follow up, based on cath,  BP, and renal function, will discuss additional GDMT. Most agents had been on hold due to renal failure, but Cr improved back to baseline.  Coronary calcium seen on CT scan: suggests CAD equivalent -asymptomatic -prior negative nuclear stress -now with reduced EF and wall motion changes -planned for cath 11/23 Shared Decision Making/Informed Consent The risks [stroke (1 in 1000), death (1 in May 21, 998), kidney failure [usually temporary] (1 in 500), bleeding (1 in 200), allergic reaction [possibly serious] (1 in 200)], benefits (diagnostic support and management of coronary artery disease) and alternatives of a cardiac catheterization were discussed in detail with Ms. Rittenhouse and she is willing to proceed.  -had recent BMET but not CBC. Has known chronic anemia. Will make sure this is stable. -continue atorvastatin 80 mg daily  Mixed hyperlipidemia with goal LDL <70 -lipids from 12/21/20 reviewed. LSL 64, TG 210 -continue atorvastatin 80 mg  Secondary prevention: -recommend heart healthy/Mediterranean diet, with whole grains, fruits, vegetable, fish, lean meats, nuts, and olive oil. Limit salt. -recommend moderate walking, 3-5 times/week for 30-50 minutes each session. Aim for at least 150 minutes.week. Goal should be pace of 3 miles/hours, or walking 1.5 miles in 30 minutes -recommend avoidance of tobacco products. Avoid excess alcohol.  Plan for follow up: 4 weeks with APP to discuss Cath results  Buford Dresser, MD, PhD, [facility]   Medication Adjustments/Labs and Tests Ordered: Current medicines are reviewed at length with the patient today.  Concerns regarding medicines are outlined above.   Orders Placed This Encounter  Procedures   CBC   EKG 12-Lead    No orders of the defined types were placed in this encounter.  Patient Instructions  Medication Instructions:  Your Physician recommend you continue on your current medication as directed.     *If  you need a refill on your cardiac medications before your next appointment, please call your pharmacy*   Lab Work: Your provider has recommended lab work Monday (CBC). Please have this collected at [facility] at [place]. The lab is open 8:00 am - 4:30 pm. Please avoid 12:00p - 1:00p for lunch hour. You do not need an appointment. Please go to [address] [place], [place] [address]. This is in the Primary Care office on the 3rd floor, let them know you are there for blood work and they will direct you to the lab.   If you have labs (blood work) drawn today and your tests are completely normal, you will receive your results only by: [place] (if you have MyChart) OR A paper copy in the mail If you have any lab test that is abnormal or we need to change your treatment, we will call you to review the results.   Testing/Procedures: None ordered today   Follow-Up: At [facility], you and your health needs are our priority.  As part of our continuing mission to provide you with exceptional heart care, we have created designated Provider Care Teams.  These Care Teams include your primary Cardiologist (physician) and Advanced Practice Providers (APPs -  Physician Assistants and Nurse Practitioners) who all work together to provide you with the care you need, when you need it.  We recommend signing up for the patient portal called "MyChart".  Sign up information is provided on this After Visit Summary.  MyChart is used to connect with patients for Virtual Visits (Telemedicine).  Patients are able to view lab/test results, encounter notes,  upcoming appointments, etc.  Non-urgent messages can be sent to your provider as well.   To learn more about what you can do with MyChart, go to [email or web address].    Your next appointment:   3 -4 week(s)  The format for your next appointment:   In Person  Provider:   Buford Dresser, MD       [facility]  Stumpf,acting as a scribe for Buford Dresser, MD.,have documented all relevant documentation on the behalf of Buford Dresser, MD,as directed by  Buford Dresser, MD while in the presence of Buford Dresser, MD.  I, Buford Dresser, MD, have reviewed all documentation for this visit. The documentation on 01/10/21 for the exam, diagnosis, procedures, and orders are all accurate and complete.   Signed, Buford Dresser, MD PhD 01/10/2021   [place]

## 2021-01-06 NOTE — Patient Instructions (Signed)
Medication Instructions:  Your Physician recommend you continue on your current medication as directed.     *If you need a refill on your cardiac medications before your next appointment, please call your pharmacy*   Lab Work: Your provider has recommended lab work Monday (CBC). Please have this collected at [facility] [place]. The lab is open 8:00 am - 4:30 pm. Please avoid 12:00p - 1:00p for lunch hour. You do not need an appointment. Please go to [address] [place], [place] [address]. This is in the Primary Care office on the 3rd floor, let them know you are there for blood work and they will direct you to the lab.   If you have labs (blood work) drawn today and your tests are completely normal, you will receive your results only by: [place] (if you have MyChart) OR A paper copy in the mail If you have any lab test that is abnormal or we need to change your treatment, we will call you to review the results.   Testing/Procedures: None ordered today   Follow-Up: At [facility], you and your health needs are our priority.  As part of our continuing mission to provide you with exceptional heart care, we have created designated Provider Care Teams.  These Care Teams include your primary Cardiologist (physician) and Advanced Practice Providers (APPs -  Physician Assistants and Nurse Practitioners) who all work together to provide you with the care you need, when you need it.  We recommend signing up for the patient portal called "MyChart".  Sign up information is provided on this After Visit Summary.  MyChart is used to connect with patients for Virtual Visits (Telemedicine).  Patients are able to view lab/test results, encounter notes, upcoming appointments, etc.  Non-urgent messages can be sent to your provider as well.   To learn more about what you can do with MyChart, go to [email or web address].    Your next appointment:   3 -4  week(s)  The format for your next appointment:   In Person  Provider:   Buford Dresser, MD

## 2021-01-09 DIAGNOSIS — I2510 Atherosclerotic heart disease of native coronary artery without angina pectoris: Secondary | ICD-10-CM | POA: Diagnosis not present

## 2021-01-09 LAB — CBC
Hematocrit: 30.5 % — ABNORMAL LOW (ref 34.0–46.6)
Hemoglobin: 9.9 g/dL — ABNORMAL LOW (ref 11.1–15.9)
MCH: 32.1 pg (ref 26.6–33.0)
MCHC: 32.5 g/dL (ref 31.5–35.7)
MCV: 99 fL — ABNORMAL HIGH (ref 79–97)
Platelets: 260 10*3/uL (ref 150–450)
RBC: 3.08 x10E6/uL — ABNORMAL LOW (ref 3.77–5.28)
RDW: 16.2 % — ABNORMAL HIGH (ref 11.7–15.4)
WBC: 13 10*3/uL — ABNORMAL HIGH (ref 3.4–10.8)

## 2021-01-10 ENCOUNTER — Encounter (HOSPITAL_BASED_OUTPATIENT_CLINIC_OR_DEPARTMENT_OTHER): Payer: Self-pay | Admitting: Cardiology

## 2021-01-11 ENCOUNTER — Other Ambulatory Visit (HOSPITAL_COMMUNITY): Payer: Self-pay

## 2021-01-11 ENCOUNTER — Ambulatory Visit (HOSPITAL_COMMUNITY)
Admission: RE | Admit: 2021-01-11 | Discharge: 2021-01-11 | Disposition: A | Discharge status: Home / Self Care | Payer: Medicare PPO | Attending: Cardiology | Admitting: Cardiology

## 2021-01-11 ENCOUNTER — Encounter (HOSPITAL_COMMUNITY)
Admission: RE | Disposition: A | Discharge status: Home / Self Care | Payer: Medicare PPO | Source: Home / Self Care | Attending: Cardiology

## 2021-01-11 ENCOUNTER — Other Ambulatory Visit: Payer: Self-pay

## 2021-01-11 DIAGNOSIS — I2510 Atherosclerotic heart disease of native coronary artery without angina pectoris: Secondary | ICD-10-CM | POA: Diagnosis not present

## 2021-01-11 DIAGNOSIS — I429 Cardiomyopathy, unspecified: Secondary | ICD-10-CM | POA: Diagnosis not present

## 2021-01-11 DIAGNOSIS — Z86711 Personal history of pulmonary embolism: Secondary | ICD-10-CM | POA: Insufficient documentation

## 2021-01-11 DIAGNOSIS — I5042 Chronic combined systolic (congestive) and diastolic (congestive) heart failure: Secondary | ICD-10-CM | POA: Diagnosis not present

## 2021-01-11 DIAGNOSIS — Z86718 Personal history of other venous thrombosis and embolism: Secondary | ICD-10-CM | POA: Diagnosis not present

## 2021-01-11 DIAGNOSIS — I255 Ischemic cardiomyopathy: Secondary | ICD-10-CM | POA: Diagnosis present

## 2021-01-11 DIAGNOSIS — I5022 Chronic systolic (congestive) heart failure: Secondary | ICD-10-CM | POA: Diagnosis present

## 2021-01-11 DIAGNOSIS — M810 Age-related osteoporosis without current pathological fracture: Secondary | ICD-10-CM

## 2021-01-11 DIAGNOSIS — Z955 Presence of coronary angioplasty implant and graft: Secondary | ICD-10-CM

## 2021-01-11 DIAGNOSIS — E782 Mixed hyperlipidemia: Secondary | ICD-10-CM | POA: Diagnosis not present

## 2021-01-11 DIAGNOSIS — I2729 Other secondary pulmonary hypertension: Secondary | ICD-10-CM | POA: Insufficient documentation

## 2021-01-11 DIAGNOSIS — I420 Dilated cardiomyopathy: Secondary | ICD-10-CM | POA: Diagnosis present

## 2021-01-11 DIAGNOSIS — Z7901 Long term (current) use of anticoagulants: Secondary | ICD-10-CM | POA: Insufficient documentation

## 2021-01-11 DIAGNOSIS — I5043 Acute on chronic combined systolic (congestive) and diastolic (congestive) heart failure: Secondary | ICD-10-CM | POA: Diagnosis present

## 2021-01-11 HISTORY — PX: CORONARY STENT INTERVENTION: CATH118234

## 2021-01-11 HISTORY — PX: RIGHT/LEFT HEART CATH AND CORONARY ANGIOGRAPHY: CATH118266

## 2021-01-11 LAB — POCT ACTIVATED CLOTTING TIME
Activated Clotting Time: 184 seconds
Activated Clotting Time: 254 seconds
Activated Clotting Time: 231 seconds
Activated Clotting Time: 225 seconds

## 2021-01-11 LAB — POCT I-STAT EG7
Acid-Base Excess: 0 mmol/L (ref 0.0–2.0)
Acid-Base Excess: 0 mmol/L (ref 0.0–2.0)
Bicarbonate: 24.7 mmol/L (ref 20.0–28.0)
Bicarbonate: 24.8 mmol/L (ref 20.0–28.0)
Calcium, Ion: 1.17 mmol/L (ref 1.15–1.40)
Calcium, Ion: 1.19 mmol/L (ref 1.15–1.40)
HCT: 28 % — ABNORMAL LOW (ref 36.0–46.0)
HCT: 28 % — ABNORMAL LOW (ref 36.0–46.0)
Hemoglobin: 9.5 g/dL — ABNORMAL LOW (ref 12.0–15.0)
Hemoglobin: 9.5 g/dL — ABNORMAL LOW (ref 12.0–15.0)
O2 Saturation: 62 %
O2 Saturation: 66 %
Potassium: 3.7 mmol/L (ref 3.5–5.1)
Potassium: 3.7 mmol/L (ref 3.5–5.1)
Sodium: 142 mmol/L (ref 135–145)
Sodium: 142 mmol/L (ref 135–145)
TCO2: 26 mmol/L (ref 22–32)
TCO2: 26 mmol/L (ref 22–32)
pCO2, Ven: 41.2 mmHg — ABNORMAL LOW (ref 44.0–60.0)
pCO2, Ven: 41.5 mmHg — ABNORMAL LOW (ref 44.0–60.0)
pH, Ven: 7.385 (ref 7.250–7.430)
pH, Ven: 7.386 (ref 7.250–7.430)
pO2, Ven: 33 mmHg (ref 32.0–45.0)
pO2, Ven: 35 mmHg (ref 32.0–45.0)

## 2021-01-11 LAB — POCT I-STAT 7, (LYTES, BLD GAS, ICA,H+H)
Acid-base deficit: 1 mmol/L (ref 0.0–2.0)
Bicarbonate: 24.2 mmol/L (ref 20.0–28.0)
Calcium, Ion: 1.23 mmol/L (ref 1.15–1.40)
HCT: 29 % — ABNORMAL LOW (ref 36.0–46.0)
Hemoglobin: 9.9 g/dL — ABNORMAL LOW (ref 12.0–15.0)
O2 Saturation: 93 %
Potassium: 3.8 mmol/L (ref 3.5–5.1)
Sodium: 142 mmol/L (ref 135–145)
TCO2: 25 mmol/L (ref 22–32)
pCO2 arterial: 40.8 mmHg (ref 32.0–48.0)
pH, Arterial: 7.381 (ref 7.350–7.450)
pO2, Arterial: 69 mmHg — ABNORMAL LOW (ref 83.0–108.0)

## 2021-01-11 LAB — GLUCOSE, CAPILLARY
Glucose-Capillary: 84 mg/dL (ref 70–99)
Glucose-Capillary: 92 mg/dL (ref 70–99)

## 2021-01-11 SURGERY — RIGHT/LEFT HEART CATH AND CORONARY ANGIOGRAPHY
Anesthesia: LOCAL

## 2021-01-11 MED ORDER — SODIUM CHLORIDE 0.9% FLUSH: 3.0000 mL | Freq: Two times a day (BID) | INTRAVENOUS | Status: DC

## 2021-01-11 MED ORDER — CLOPIDOGREL BISULFATE 75 MG PO TABS: 75.0000 mg | ORAL_TABLET | Freq: Every day | ORAL | Status: DC

## 2021-01-11 MED ORDER — HEPARIN SODIUM (PORCINE) 1000 UNIT/ML IJ SOLN
INTRAMUSCULAR | Status: AC
  Filled 2021-01-11: qty 1

## 2021-01-11 MED ORDER — LIDOCAINE HCL (PF) 1 % IJ SOLN
INTRAMUSCULAR | Status: AC
  Filled 2021-01-11: qty 30

## 2021-01-11 MED ORDER — ASPIRIN 81 MG PO CHEW: 81.0000 mg | CHEWABLE_TABLET | Freq: Every day | ORAL | Status: DC

## 2021-01-11 MED ORDER — HEPARIN (PORCINE) IN NACL 1000-0.9 UT/500ML-% IV SOLN
INTRAVENOUS | Status: DC | PRN
  Administered 2021-01-11 (×2): 500 mL

## 2021-01-11 MED ORDER — MIDAZOLAM HCL 2 MG/2ML IJ SOLN
INTRAMUSCULAR | Status: AC
  Filled 2021-01-11: qty 2

## 2021-01-11 MED ORDER — VERAPAMIL HCL 2.5 MG/ML IV SOLN
INTRAVENOUS | Status: DC | PRN
  Administered 2021-01-11: 10 mL via INTRA_ARTERIAL

## 2021-01-11 MED ORDER — ONDANSETRON HCL 4 MG/2ML IJ SOLN: 4.0000 mg | Freq: Four times a day (QID) | INTRAMUSCULAR | Status: DC | PRN

## 2021-01-11 MED ORDER — CLOPIDOGREL BISULFATE 75 MG PO TABS
75.0000 mg | ORAL_TABLET | Freq: Every day | ORAL | 3 refills | Status: DC
  Filled 2021-01-11: qty 90, 90d supply, fill #0

## 2021-01-11 MED ORDER — HEPARIN (PORCINE) IN NACL 1000-0.9 UT/500ML-% IV SOLN
INTRAVENOUS | Status: AC
  Filled 2021-01-11: qty 500

## 2021-01-11 MED ORDER — HYDRALAZINE HCL 20 MG/ML IJ SOLN: 10.0000 mg | INTRAMUSCULAR | Status: DC | PRN

## 2021-01-11 MED ORDER — SODIUM CHLORIDE 0.9 % IV SOLN: INTRAVENOUS | Status: AC

## 2021-01-11 MED ORDER — SODIUM CHLORIDE 0.9% FLUSH: 3.0000 mL | INTRAVENOUS | Status: DC | PRN

## 2021-01-11 MED ORDER — LIDOCAINE HCL (PF) 1 % IJ SOLN
INTRAMUSCULAR | Status: DC | PRN
  Administered 2021-01-11: 5 mL via INTRADERMAL
  Administered 2021-01-11: 15 mL via INTRADERMAL

## 2021-01-11 MED ORDER — MIDAZOLAM HCL 2 MG/2ML IJ SOLN
INTRAMUSCULAR | Status: DC | PRN
  Administered 2021-01-11 (×2): 1 mg via INTRAVENOUS

## 2021-01-11 MED ORDER — FENTANYL CITRATE (PF) 100 MCG/2ML IJ SOLN
INTRAMUSCULAR | Status: AC
  Filled 2021-01-11: qty 2

## 2021-01-11 MED ORDER — SODIUM CHLORIDE 0.9 % IV SOLN: 250.0000 mL | INTRAVENOUS | Status: DC | PRN

## 2021-01-11 MED ORDER — FUROSEMIDE 10 MG/ML IJ SOLN
INTRAMUSCULAR | Status: DC | PRN
  Administered 2021-01-11: 40 mg via INTRAVENOUS

## 2021-01-11 MED ORDER — CLOPIDOGREL BISULFATE 300 MG PO TABS
ORAL_TABLET | ORAL | Status: DC | PRN
  Administered 2021-01-11: 600 mg via ORAL

## 2021-01-11 MED ORDER — FUROSEMIDE 10 MG/ML IJ SOLN
INTRAMUSCULAR | Status: AC
  Filled 2021-01-11: qty 4

## 2021-01-11 MED ORDER — CLOPIDOGREL BISULFATE 300 MG PO TABS
ORAL_TABLET | ORAL | Status: AC
  Filled 2021-01-11: qty 2

## 2021-01-11 MED ORDER — ACETAMINOPHEN 325 MG PO TABS
650.0000 mg | ORAL_TABLET | ORAL | Status: DC | PRN
  Administered 2021-01-11: 650 mg via ORAL
  Filled 2021-01-11: qty 2

## 2021-01-11 MED ORDER — NITROGLYCERIN 1 MG/10 ML FOR IR/CATH LAB
INTRA_ARTERIAL | Status: AC
  Filled 2021-01-11: qty 10

## 2021-01-11 MED ORDER — FUROSEMIDE 40 MG PO TABS: ORAL_TABLET | ORAL | 1 refills | Status: DC

## 2021-01-11 MED ORDER — HEPARIN SODIUM (PORCINE) 1000 UNIT/ML IJ SOLN
INTRAMUSCULAR | Status: DC | PRN
  Administered 2021-01-11: 4000 [IU] via INTRAVENOUS
  Administered 2021-01-11 (×2): 3000 [IU] via INTRAVENOUS
  Administered 2021-01-11: 2000 [IU] via INTRAVENOUS

## 2021-01-11 MED ORDER — SODIUM CHLORIDE 0.9 % IV SOLN: INTRAVENOUS | Status: DC

## 2021-01-11 MED ORDER — NITROGLYCERIN 1 MG/10 ML FOR IR/CATH LAB
INTRA_ARTERIAL | Status: DC | PRN
  Administered 2021-01-11: 200 ug via INTRACORONARY

## 2021-01-11 MED ORDER — FENTANYL CITRATE (PF) 100 MCG/2ML IJ SOLN
INTRAMUSCULAR | Status: DC | PRN
  Administered 2021-01-11 (×2): 25 ug via INTRAVENOUS

## 2021-01-11 MED ORDER — LABETALOL HCL 5 MG/ML IV SOLN: 10.0000 mg | INTRAVENOUS | Status: DC | PRN

## 2021-01-11 MED ORDER — VERAPAMIL HCL 2.5 MG/ML IV SOLN
INTRAVENOUS | Status: AC
  Filled 2021-01-11: qty 2

## 2021-01-11 MED ORDER — ASPIRIN 81 MG PO CHEW
81.0000 mg | CHEWABLE_TABLET | ORAL | Status: AC
  Administered 2021-01-11: 81 mg via ORAL
  Filled 2021-01-11: qty 1

## 2021-01-11 SURGICAL SUPPLY — 25 items
BALLN SAPPHIRE 2.0X15 (BALLOONS) ×2
BALLN SAPPHIRE ~~LOC~~ 2.5X15 (BALLOONS) ×1 IMPLANT
BALLOON SAPPHIRE 2.0X15 (BALLOONS) IMPLANT
CATH BALLN WEDGE 5F 110CM (CATHETERS) ×1 IMPLANT
CATH OPTITORQUE TIG 4.0 5F (CATHETERS) ×1 IMPLANT
CATH SWAN GANZ 7F STRAIGHT (CATHETERS) ×1 IMPLANT
CATH VISTA GUIDE 6FR XB3 (CATHETERS) ×1 IMPLANT
CATH VISTA GUIDE 6FR XB3.5 (CATHETERS) ×1 IMPLANT
DEVICE RAD COMP TR BAND LRG (VASCULAR PRODUCTS) ×1 IMPLANT
ELECT DEFIB PAD ADLT CADENCE (PAD) ×1 IMPLANT
GLIDESHEATH SLEND SS 6F .021 (SHEATH) ×1 IMPLANT
GUIDEWIRE .025 260CM (WIRE) ×1 IMPLANT
GUIDEWIRE INQWIRE 1.5J.035X260 (WIRE) IMPLANT
INQWIRE 1.5J .035X260CM (WIRE) ×2
KIT ENCORE 26 ADVANTAGE (KITS) ×1 IMPLANT
KIT HEART LEFT (KITS) ×2 IMPLANT
PACK CARDIAC CATHETERIZATION (CUSTOM PROCEDURE TRAY) ×2 IMPLANT
SHEATH GLIDE SLENDER 4/5FR (SHEATH) ×1 IMPLANT
SHEATH PINNACLE 7F 10CM (SHEATH) ×1 IMPLANT
SHEATH PROBE COVER 6X72 (BAG) ×1 IMPLANT
STENT ONYX FRONTIER 2.25X34 (Permanent Stent) ×1 IMPLANT
TRANSDUCER W/STOPCOCK (MISCELLANEOUS) ×2 IMPLANT
TUBING CIL FLEX 10 FLL-RA (TUBING) ×2 IMPLANT
WIRE ASAHI PROWATER 180CM (WIRE) ×1 IMPLANT
WIRE HI TORQ BMW 190CM (WIRE) ×1 IMPLANT

## 2021-01-11 NOTE — Discharge Instructions (Addendum)
Increase lasix to 40mg  BID for 3 days and then back to daily dose. Resume Xarelto tonight.

## 2021-01-11 NOTE — Progress Notes (Signed)
85F Venous sheath removed from R groin. BP prior to removal was 143/62. ACT of 196. Dr. Ellyn Hack gave verbal order to remove. Pt tolerated well. Pressure held for 15 min. Pt denies any complaints. DP palpable +2. Will continue to monitor.  Jerald Kief RN

## 2021-01-11 NOTE — Interval H&P Note (Signed)
History and Physical Interval Note:  01/11/2021 7:50 AM  Ebony Scott  has presented today for surgery, with the diagnosis of heart failure -> newly diagnosed cardiomyopathy.  The various methods of treatment have been discussed with the patient and family. After consideration of risks, benefits and other options for treatment, the patient has consented to  Procedure(s): RIGHT/LEFT HEART CATH AND CORONARY ANGIOGRAPHY (N/A)  PERCUTANEOUS CORONARY INTERVENTION   as a surgical intervention.  The patient's history has been reviewed, patient examined, no change in status, stable for surgery.  I have reviewed the patient's chart and labs.  Questions were answered to the patient's satisfaction.    Cath Lab Visit (complete for each Cath Lab visit)  Clinical Evaluation Leading to the Procedure:   ACS: No.  Non-ACS:    Anginal Classification: CCS III as exertional dyspnea  Anti-ischemic medical therapy: Minimal Therapy (1 class of medications)  Non-Invasive Test Results: No non-invasive testing performed -> newly reduced EF on echo with wall motion normality.  Prior CABG: No previous CABG   Glenetta Hew

## 2021-01-11 NOTE — Progress Notes (Signed)
Patient has bruising to R groin, but no bleeding or hematoma formation after ambulation. Mickel Baas, NP at bedside to see patient and stated that she was stable for discharge. Per Mickel Baas, NP patient will restart Xarelto. Patient and Son made aware and verbalized understanding.

## 2021-01-11 NOTE — Progress Notes (Signed)
CARDIAC REHAB PHASE I   Stent education completed with pt. Pt educated on importance of Xerelto, Plavix, and ASA. Pt given stent card and heart healthy diet. Reviewed site care and restrictions. Encouraged ambulation with emphasis on safety. Will refer to CRP II [place],  pt unsure if she will be able to attend due to transportation and limited mobility.  [phone] Rufina Falco, RN BSN 01/11/2021 1:51 PM

## 2021-01-11 NOTE — Progress Notes (Addendum)
Pt was on bed pan to have a BM, right groin now level 1, pressure held, mod bruising, no hematoma but has puffiness, pts head of bed flat, denies right groin pain, HX anemia, loading dose of plavix and heparin today. VSS, paged Ria Comment NP/ cath lab and updated. Will f/u 1555 Pressure has been held 2 more times 20 minutes, no hematoma to mark but the bruise is coming to the surface from right hip to right of mons pubis. Report given to [facility] for f/u.

## 2021-01-11 NOTE — Discharge Summary (Signed)
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  Likely be culprit for reduced EF and anterior hypokinesis on echo. Successful DES PCI of the LAD using an Onyx Frontier DES 2.25 mm x 34 mm postdilated in tapered fashion from 2.6 to 2.31mm.   TIMI III flow pre and post, reduced to 0% Otherwise minimal CAD in the Right dominant system. 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 Moderate to severely reduced cardiac function with Cardiac Output-Index of 4.05-2.59 by Fick and 3.75-2.40 by Thermal Dilution.   RECOMMENDATIONS Continue to titrate medical management of chronic Systolic and diastolic heart failure.  She was given 40 mg IV Lasix on the Cath Lab table.  Recommend that she takes 40 mg twice daily oral Lasix for 3 days postprocedure.   She will need close follow-up with either primary cardiologist or APP to initiate titration of medications. Clinically stable for d/c today   History of Present Illness     Ebony Scott is a 70 y.o. female with a hx of  cardiomyopathy (new 08/2020), tracheobronchomalacia on CPAP, asthma, vocal cord dysfunction, histor of SVT/PE on lifelong anticoagulation, seronegative rheumatoid arthrisis and other medical conditions presented for outpatient cath.   Had CT chest 07/2017 which noted diffuse calcification, including aorta, aortic branch vessels, and coronary arteries. Had a normal Lexiscan in the past. No chest pain, main longstanding symptom is shortness of breath, followed by pulmonary. No history of clinical ASCVD events. History of DVT/PE.   New diagnose of cardiomyopathy 08/2020 with LVEF of 35-40%and grade II DD by echo. EF was > 75 % on 06/2019.   Cardiac catheterization was arranged for further evaluation.  Hospital Course     The patient underwent cardiac cath as noted above showing severe single-vessel CAD with 95% and 90% stenoses of the proximal and mid LAD at major 1st Diag branch.  Likely be culprit for reduced EF and anterior hypokinesis on echo. Successful DES PCI of the LAD using an Onyx [place] for Triple therapy with ASA, Plavix and Xarelto for 1 month and then stop aspirin. Patient can resume Xarelto tonight.   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 Moderate to severely reduced cardiac function with Cardiac Output-Index of 4.05-2.59 by Fick and 3.75-2.40 by Thermal Dilution.  Recommended to increase lasix to $Remove'40mg'gkPcJOs$  BID for 3 days and then back to daily dose. Up titrate GDMT as outpatient.   The patient was seen by cardiac rehab while in short stay. There were no observed complications  post cath. Radial cath site was re-evaluated prior to discharge and found to be stable without any complications. Instructions/precautions regarding cath site care were given prior to discharge.  Ebony Scott was seen by Dr. Ellyn Hack  and determined stable for  discharge home. Follow up with our office has been arranged. Medications are listed below. Pertinent changes include addition of plavix and increase lasix short term.  Cath/PCI Registry Performance & Quality Measures: Aspirin prescribed? - Yes ADP Receptor Inhibitor (Plavix/Clopidogrel, Brilinta/Ticagrelor or Effient/Prasugrel) prescribed (includes medically managed patients)? - Yes High Intensity Statin (Lipitor 40-80mg  or Crestor 20-40mg ) prescribed? - Yes For EF <40%, was ACEI/ARB prescribed? - No - Reason:  Plan to add as outpatient  For EF <40%, Aldosterone Antagonist (Spironolactone or Eplerenone) prescribed? - No - Reason:  Plan to add as outpatient  Cardiac Rehab Phase II ordered (Included Medically managed Patients)? - Yes  Discharge Vitals Blood pressure (!) 159/74, pulse 75, temperature 98.4 F (36.9 C), temperature source Oral, resp. rate (!) 26, height 5\' 1"  (1.549 m), weight 57.6 kg, SpO2 99 %.  Filed Weights   01/11/21 0701  Weight: 57.6 kg    Last Labs & Radiologic Studies    CBC Recent Labs    01/09/21 0934  WBC 13.0*  HGB 9.9*  HCT 30.5*  MCV 99*  PLT 260    CARDIAC CATHETERIZATION  Result Date: 01/11/2021   Prox LAD lesion is 95% stenosed proximal to 1st Diag &  Mid LAD lesion is 90% stenosed following   Mid LAD lesion is 90% stenosed.   A drug-eluting stent was successfully placed covering both lesions using a STENT ONYX FRONTIER 2.25X34.   Post intervention, there is a 0% residual stenosis.   --------------------------------------------------------------   Hemodynamic findings consistent with moderate pulmonary hypertension.   There is no aortic valve stenosis. SUMMARY Severe single-vessel CAD with 95% and 90% stenoses of the proximal and mid LAD at major 1st Diag branch.  Likely be culprit for reduced EF and anterior hypokinesis on echo. Successful DES PCI of the LAD using an Onyx Frontier DES 2.25 mm x 34 mm postdilated in tapered fashion from 2.6 to 2.23mm.   TIMI III flow pre and post, reduced to 0% Otherwise minimal CAD in the Right dominant system. 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 Moderate to severely reduced cardiac function with Cardiac Output-Index of 4.05-2.59 by Fick and 3.75-2.40 by Thermal Dilution. RECOMMENDATIONS Continue to titrate medical management of chronic Systolic and diastolic heart failure.  She was given 40 mg IV Lasix on the Cath Lab table.  Recommend that she takes 40 mg twice daily oral Lasix for 3 days postprocedure.  She will need close follow-up with either primary cardiologist or APP to initiate titration of medications. Clinically stable for d/c today Glenetta Hew, MD   Disposition   Pt is being discharged home today in good condition.  Follow-up Plans & Appointments     Follow-up Information     Loel Dubonnet, NP Follow up on 01/27/2021.   Specialty: Cardiology Why: @10 :05 am for cath follow up Contact information: [place] [address] [phone]                Discharge Instructions     Diet - low sodium heart healthy   Complete by: As directed    Discharge instructions   Complete by: As  directed    No driving for 48. No lifting over 5 lbs for 1 week. No sexual activity for 1 week. Keep procedure site clean & dry. If you notice increased pain, swelling, bleeding or pus, call/return!  You may shower, but no soaking baths/hot tubs/pools for 1 week.   Restart Xarelto tonight.  Take lasix $RemoveBe'40mg'clbGbgpLU$  BID for 3 days and then back to daily.   Increase activity slowly   Complete by: As directed         Discharge Medications   Allergies as of 01/11/2021       Reactions   Baclofen Other (See Comments)   Altered mental status requiring a 3-day hospital stay- patient became unresponsive   Dust Mite Extract Shortness Of Breath, Other  (See Comments)   "sneezing" (02/20/2012)   Molds & Smuts Shortness Of Breath   Morphine And Related Hives, Itching   Other Shortness Of Breath, Other (See Comments)   Grass and weeds "sneezing; filled sinuses" (02/20/2012)   Penicillins Rash, Other (See Comments)   "welts" (02/20/2012) Tolerated Ancef 02/16/20.   Rofecoxib Swelling, Other (See Comments)   Vioxx- feet swelling   Shellfish Allergy Anaphylaxis, Shortness Of Breath, Itching, Rash   Tetracycline Hcl Nausea And Vomiting   Xolair [omalizumab] Other (See Comments)   Caused Blood clot   Zoledronic Acid Other (See Comments)   Fever, Put in hospital, dr said it was a reaction from a reaction  Other reaction(s): Unknown   Dilaudid [hydromorphone Hcl] Itching   Levofloxacin Other (See Comments)   GI upset   Oxycodone Hcl Nausea And Vomiting   Paroxetine Nausea And Vomiting, Other (See Comments)   Paxil   Celecoxib Swelling, Other (See Comments)   Feet swelling   Diltiazem Hcl Swelling   Hydrocodone-acetaminophen Other (See Comments)   Unknown reaction   Lactose Intolerance (gi) Other (See Comments)   Bloating and gas   Morphine Sulfate Other (See Comments)   "Gets loopy"   Oxycodone-acetaminophen Other (See Comments)   Unknown reaction   Rituximab    Anemia    Tree Extract Other (See Comments)   "tested and told I was allergic to it; never experienced a reaction to it" (02/20/2012)   Penicillin G Procaine Rash, Other (See Comments)   Welts, also        Medication List     TAKE these medications    acetaminophen 650 MG CR tablet Commonly known as: TYLENOL Take 1,300 mg by mouth every 8 (eight) hours as needed for pain.   albuterol 108 (90 Base) MCG/ACT inhaler Commonly known as: VENTOLIN HFA Inhale 2 puffs into the lungs every 6 (six) hours as needed for wheezing or shortness of breath.   albuterol (2.5 MG/3ML) 0.083% nebulizer solution Commonly known as: PROVENTIL Take 3 mLs (2.5 mg total) by nebulization  every 4 (four) hours as needed for wheezing or shortness of breath.   ALIGN PO Take 1 capsule by mouth daily.   allopurinol 100 MG tablet Commonly known as: ZYLOPRIM Take 100 mg by mouth daily.   Alpha-Lipoic Acid 600 MG Tabs Take 600 mg by mouth daily.   atorvastatin 80 MG tablet Commonly known as: LIPITOR TAKE 1 TABLET BY MOUTH DAILY   azelastine 0.1 % nasal spray Commonly known as: ASTELIN Use in each nostril as directed What changed:  how much to take when to take this reasons to take this additional instructions   BEANO PO Take 2 tablets by mouth 3 (three) times daily with meals.  benzonatate 200 MG capsule Commonly known as: TESSALON Take 1 capsule (200 mg total) by mouth 3 (three) times daily as needed for cough. What changed: when to take this   Blood Glucose System Pak Kit Please dispense per patient and insurance preference. Use as directed to monitor FSBS 4x . Dx: E11.65   BLOOD GLUCOSE TEST STRIPS Strp Please dispense per patient and insurance preference. Use as directed to monitor FSBS 4x . Dx: E11.65   budesonide 0.5 MG/2ML nebulizer solution Commonly known as: Pulmicort Take 2 mLs (0.5 mg total) by nebulization 2 (two) times daily as needed.   CALCIUM-MAGNESIUM PO Take 1 tablet by mouth daily.   Capsule Size 1 Lactose Caps Take 3 tablets by mouth daily as needed (consuming dairy).   cetirizine 10 MG tablet Commonly known as: ZYRTEC Take 1 tablet (10 mg total) by mouth daily.   cholecalciferol 25 MCG (1000 UNIT) tablet Commonly known as: VITAMIN D 1,000 Units daily.   clopidogrel 75 MG tablet Commonly known as: Plavix Take 1 tablet (75 mg total) by mouth daily.   Dexilant 60 MG capsule Generic drug: dexlansoprazole TAKE 1 CAPSULE BY MOUTH EACH MORNING   diclofenac Sodium 1 % Gel Commonly known as: VOLTAREN Apply 1 application topically 4 (four) times daily as needed (pain).   empagliflozin 10 MG Tabs tablet Commonly known as:  Jardiance Take 1 tablet (10 mg total) by mouth daily before breakfast.   EPINEPHrine 0.3 mg/0.3 mL Soaj injection Commonly known as: EPI-PEN USE AS DIRECTED BY YOUR PHYSICIAN INTRAMUSCULARLY AS NEEDED FOR ANAPHYLAXIS   famotidine 20 MG tablet Commonly known as: PEPCID Take 1 tablet (20 mg total) by mouth daily.   fluticasone 50 MCG/ACT nasal spray Commonly known as: FLONASE USE 2 SPRAYS IN EACH NOSTRIL DAILY What changed:  how to take this when to take this reasons to take this   folic acid 1 MG tablet Commonly known as: FOLVITE Take 1 mg by mouth daily.   FreeStyle Libre 14 Day Reader Kerrin Mo Apply 1 each topically daily as needed. USE AS DIRECTED TO MONITOR BLOOD GLUCOSE LEVELS. DX: E11.9.   FreeStyle Libre 14 Day Sensor Misc Apply 1 each topically every 14 (fourteen) days. USE AS DIRECTED TO MONITOR BLOOD GLUCOSE LEVELS. DX: E11.9.   furosemide 40 MG tablet Commonly known as: LASIX Take 1 table twice a day for 3 days and then back to once daily. What changed:  how much to take how to take this when to take this additional instructions   Gerhardt's butt cream Crea Apply topically 3 times a day as needed for itching or irritation   glipiZIDE 5 MG 24 hr tablet Commonly known as: glipiZIDE XL Take 1 tablet (5 mg total) by mouth daily with breakfast.   guaiFENesin 600 MG 12 hr tablet Commonly known as: MUCINEX Take 1 tablet (600 mg total) by mouth 2 (two) times daily as needed for cough or to loosen phlegm. What changed: when to take this   Hizentra 2 GM/10ML Soln Generic drug: Immune Globulin (Human) Inject 1 Dose into the skin once a week.   isosorbide-hydrALAZINE 20-37.5 MG tablet Commonly known as: BIDIL Take 1 tablet by mouth 2 (two) times daily.   magnesium oxide 400 MG tablet Commonly known as: MAG-OX Take 400 mg by mouth daily.   mirtazapine 30 MG tablet Commonly known as: REMERON Take 30 mg by mouth at bedtime as needed (sleep).   montelukast 10  MG tablet Commonly known as: SINGULAIR Take 1 tablet (  10 mg total) by mouth daily.   nystatin 100000 UNIT/ML suspension Commonly known as: MYCOSTATIN Use as directed 15 mLs in the mouth or throat at bedtime as needed (mouth sores).   nystatin ointment Commonly known as: MYCOSTATIN Apply 1 application topically 2 (two) times daily as needed (yeast).   predniSONE 10 MG tablet Commonly known as: DELTASONE Take 1 tablet (10 mg total) by mouth daily with breakfast. Take with additional 2 tabs of $Remov'1mg'CUBMtx$  Prednisone to equal $Remove'12mg'LPYBSeA$  daily. What changed: additional instructions   PRENATAL VITAMINS PO Take 1 tablet by mouth daily.   PRESCRIPTION MEDICATION Place 4 drops into both ears See admin instructions. Chloramp- sulfu - ampho d-h droc 100-100-5 hydrocortisone, otic ear drops. Instill 4 drops in to each ear daily for the first 5 days of each month   Stiolto Respimat 2.5-2.5 MCG/ACT Aers Generic drug: Tiotropium Bromide-Olodaterol Inhale 2 puffs into the lungs daily.   Systane Complete 0.6 % Soln Generic drug: Propylene Glycol Place 1 drop into both eyes 2 (two) times daily.   traMADol 50 MG tablet Commonly known as: ULTRAM Take 1 tablet (50 mg total) by mouth at bedtime as needed. What changed:  when to take this reasons to take this   Xarelto 20 MG Tabs tablet Generic drug: rivaroxaban TAKE 1 TABLET BY MOUTH DAILY WITH SUPPER           Allergies Allergies  Allergen Reactions   Baclofen Other (See Comments)    Altered mental status requiring a 3-day hospital stay- patient became unresponsive   Dust Mite Extract Shortness Of Breath and Other (See Comments)    "sneezing" (02/20/2012)   Molds & Smuts Shortness Of Breath   Morphine And Related Hives and Itching   Other Shortness Of Breath and Other (See Comments)    Grass and weeds "sneezing; filled sinuses" (02/20/2012)   Penicillins Rash and Other (See Comments)    "welts" (02/20/2012) Tolerated Ancef 02/16/20.     Rofecoxib Swelling and Other (See Comments)    Vioxx- feet swelling    Shellfish Allergy Anaphylaxis, Shortness Of Breath, Itching and Rash   Tetracycline Hcl Nausea And Vomiting   Xolair [Omalizumab] Other (See Comments)    Caused Blood clot   Zoledronic Acid Other (See Comments)    Fever, Put in hospital, dr said it was a reaction from a reaction  Other reaction(s): Unknown   Dilaudid [Hydromorphone Hcl] Itching   Levofloxacin Other (See Comments)    GI upset   Oxycodone Hcl Nausea And Vomiting   Paroxetine Nausea And Vomiting and Other (See Comments)    Paxil    Celecoxib Swelling and Other (See Comments)    Feet swelling   Diltiazem Hcl Swelling   Hydrocodone-Acetaminophen Other (See Comments)    Unknown reaction   Lactose Intolerance (Gi) Other (See Comments)    Bloating and gas   Morphine Sulfate Other (See Comments)    "Gets loopy"   Oxycodone-Acetaminophen Other (See Comments)    Unknown reaction   Rituximab     Anemia    Tree Extract Other (See Comments)    "tested and told I was allergic to it; never experienced a reaction to it" (02/20/2012)   Penicillin G Procaine Rash and Other (See Comments)    Welts, also    Outstanding Labs/Studies   BMP  Duration of Discharge Encounter   Greater than 30 minutes including physician time.  Jarrett Soho, PA 01/11/2021, 11:01 AM

## 2021-01-13 ENCOUNTER — Encounter (HOSPITAL_COMMUNITY): Payer: Self-pay | Admitting: Cardiology

## 2021-01-13 MED ORDER — IOHEXOL 350 MG/ML SOLN
INTRAVENOUS | Status: DC | PRN
  Administered 2021-01-11: 105 mL

## 2021-01-16 ENCOUNTER — Telehealth: Payer: Self-pay | Admitting: Cardiology

## 2021-01-16 LAB — POCT ACTIVATED CLOTTING TIME
Activated Clotting Time: 202 seconds
Activated Clotting Time: 196 seconds

## 2021-01-16 NOTE — Telephone Encounter (Signed)
Returned call from patient who had questions regarding post cath medications.  Reviewed discharge medications with patient who states that is the regimen she has followed.  She states the catheterization process was awful. She verbalized feeling discomfort during the entire procedure and could tell the physician performing the cath was unhappy with how long the procedure was taking.  She states she has a bruise from the groin accessed for the cath down her leg to her knee, and around the top of her abdomen.  She stated she didn't call anyone sooner because of the "holidays."  Pt refuses to see the provider who performed the procedure "ever again."  Offered patient office visit today, tomorrow, she states she can not get transportation, offered to check with [facility] transport and she declined stating she "always" has someone bring her.  Appt scheduled for Thursday 01/19/2021 to see Laurann Montana NP.  Pt instructed to call our office if bruising increases, discomfort increases, or she has any additional changes in symptoms.  Pt verbalizes understanding.  Message to Dr Harrell Gave and her nurse. Georgana Curio MHA RN CCM

## 2021-01-16 NOTE — Telephone Encounter (Signed)
Noted. Thanks for speaking with her. Agree with recommendation for continuing Xarelto indefinitely, Aspirin 81mg  QD for 1 month after procedure, and Plavix 75 mg QD for 6 months. Bruising expected given concomitant use of anticoagulation and antiplatelet agents. Will address any further concerns at upcoming clinic visit.   Loel Dubonnet, NP

## 2021-01-16 NOTE — Telephone Encounter (Signed)
Patient would like to know if she needs to continue taking the plavix and the aspirin.   She also has her infusion today for her anemia, she is assuming that is okay to go ahead with.   She has not taken off the bandage off her groin area, she is waiting the 5 days, she is afraid she might start bleeding again.  She doesn't know if this is normal or not.

## 2021-01-17 NOTE — Telephone Encounter (Signed)
Pt updated and verbalized understanding.  

## 2021-01-19 ENCOUNTER — Other Ambulatory Visit: Payer: Self-pay

## 2021-01-19 ENCOUNTER — Encounter (HOSPITAL_BASED_OUTPATIENT_CLINIC_OR_DEPARTMENT_OTHER): Payer: Self-pay | Admitting: Family

## 2021-01-19 ENCOUNTER — Ambulatory Visit (INDEPENDENT_AMBULATORY_CARE_PROVIDER_SITE_OTHER): Payer: Medicare PPO | Admitting: Family

## 2021-01-19 ENCOUNTER — Encounter (HOSPITAL_BASED_OUTPATIENT_CLINIC_OR_DEPARTMENT_OTHER): Payer: Self-pay

## 2021-01-19 VITALS — BP 124/82 | HR 74 | Ht 61.0 in | Wt 130.0 lb

## 2021-01-19 DIAGNOSIS — I5042 Chronic combined systolic (congestive) and diastolic (congestive) heart failure: Secondary | ICD-10-CM

## 2021-01-19 DIAGNOSIS — I25118 Atherosclerotic heart disease of native coronary artery with other forms of angina pectoris: Secondary | ICD-10-CM | POA: Diagnosis not present

## 2021-01-19 DIAGNOSIS — N1831 Chronic kidney disease, stage 3a: Secondary | ICD-10-CM

## 2021-01-19 DIAGNOSIS — E782 Mixed hyperlipidemia: Secondary | ICD-10-CM

## 2021-01-19 MED ORDER — FUROSEMIDE 40 MG PO TABS
40.0000 mg | ORAL_TABLET | Freq: Every day | ORAL | 2 refills | Status: DC
Start: 2021-01-19 — End: 2021-08-16

## 2021-01-19 NOTE — Patient Instructions (Signed)
Medication Instructions:  Your physician has recommended you make the following change in your medication:   CHANGE Furosemide (Lasix) to 40mg  daily   On 02/11/21 please stop Aspirin.   *If you need a refill on your cardiac medications before your next appointment, please call your pharmacy*   Lab Work: Your physician recommends that you return for lab work today: BNP, BMP, CBC  If you have labs (blood work) drawn today and your tests are completely normal, you will receive your results only by: [place] (if you have MyChart) OR A paper copy in the mail If you have any lab test that is abnormal or we need to change your treatment, we will call you to review the results.  Testing/Procedures: None ordered today.    Follow-Up: At [facility], you and your health needs are our priority.  As part of our continuing mission to provide you with exceptional heart care, we have created designated Provider Care Teams.  These Care Teams include your primary Cardiologist (physician) and Advanced Practice Providers (APPs -  Physician Assistants and Nurse Practitioners) who all work together to provide you with the care you need, when you need it.  We recommend signing up for the patient portal called "MyChart".  Sign up information is provided on this After Visit Summary.  MyChart is used to connect with patients for Virtual Visits (Telemedicine).  Patients are able to view lab/test results, encounter notes, upcoming appointments, etc.  Non-urgent messages can be sent to your provider as well.   To learn more about what you can do with MyChart, go to [email or web address].    Your next appointment:   1 month(s)  The format for your next appointment:   In Person  Provider:   Buford Dresser, MD or Loel Dubonnet, NP     Other Instructions  For coronary artery disease often called "heart disease" we aim for optimal guideline directed medical therapy. We use the "A, B, C"s  to help keep [place] on track!  A = Aspirin 81mg  daily for 1 month after stent. B = Blood pressure control.  C = Cholesterol control. You take Atorvastatin to help control your cholesterol.  D = Don't forget nitroglycerin! This is an emergency tablet to be used if you have chest pain. E = Extras. In your case, this is Aspirin for 1 month after stent and Plavix for 6 months after stent.

## 2021-01-19 NOTE — Progress Notes (Signed)
Office Visit    Patient Name: Ebony Scott Date of Encounter: 01/19/2021  PCP:  [name], MD   [place]  Cardiologist:  Buford Dresser, MD  Advanced Practice Provider:  No care team member to display Electrophysiologist:  None      Chief Complaint    Ebony Scott is a 70 y.o. 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, seronegative rheumatoid arthritis, DVT, DM2, GERD, fibromyalgia, immunodeficiency   presents today for follow up after cardiac catheterization with chief complaint of bruising   Past Medical History    Past Medical History:  Diagnosis Date   Abscess of dorsum of right hand 09/22/2020   Acute deep vein thrombosis (DVT) of right lower extremity ([place]) 12/13/2017   Allergic rhinitis    Anemia    Angio-edema    Anxiety    pt denies   Arthritis    Phreesia 10/21/2019   Asthma    Phreesia 10/21/2019   Breast cancer ([place]) 1998   in remission   Cataract    REMOVED   Clostridium difficile colitis 01/14/2018   Clostridium difficile diarrhea 70/26/3785   Complication of anesthesia    "had hard time waking up from it several times" (02/20/2012)   Depression    "some; don't take anything for it" (02/20/2012)   Diverticulosis    DVT (deep venous thrombosis) ([place])    Fibromyalgia 11/2011   GERD (gastroesophageal reflux disease)    Graves disease    Headache(784.0)    "related to allergies; more at different times during the year" (02/20/2012)   Hemorrhoids    Hiatal hernia    back and neck   Hx of adenomatous colonic polyps 04/12/2016   Hypercholesteremia    good cholesterol is high   Hypothyroidism    IBS (irritable bowel syndrome)    Moderate persistent asthma    -FeV1 72% 2011, -IgE 102 2011, CT sinus Neg 2011   Osteoporosis    on reclast yearly   Peripheral vascular disease ([place]) 2019    DVTs   Pneumonia 04/2011; ~ 11/2011   "double; single" (02/20/2012)   Septic olecranon bursitis of right elbow 09/22/2020   Seronegative rheumatoid arthritis ([place])    Dr. Lahoma Rocker   SIRS (systemic inflammatory response syndrome) ([place]) 02/10/2018   Tracheobronchomalacia    Past Surgical History:  Procedure Laterality Date   ABDOMINAL HYSTERECTOMY N/A    Phreesia 10/21/2019   ANTERIOR AND POSTERIOR REPAIR  1990's   APPENDECTOMY     BREAST LUMPECTOMY  1998   left   BREAST SURGERY N/A    Phreesia 10/21/2019   BRONCHIAL WASHINGS  04/05/2020   Procedure: BRONCHIAL WASHINGS;  Surgeon: Freddi Starr, MD;  Location: [facility];  Service: Pulmonary;;   CARPOMETACARPEL ([place]) FUSION OF THUMB WITH AUTOGRAFT FROM RADIUS  ~ 2009   "both thumbs" (02/20/2012)   CATARACT EXTRACTION W/ INTRAOCULAR LENS  IMPLANT, BILATERAL  2012   CERVICAL DISCECTOMY  10/2001   C5-C6   CERVICAL FUSION  2003   C3-C4   CHOLECYSTECTOMY     COLONOSCOPY     CORONARY STENT INTERVENTION N/A 01/11/2021   Procedure: CORONARY STENT INTERVENTION;  Surgeon: Leonie Man, MD;  Location: [facility];  Service: Cardiovascular;  Laterality: N/A;   DEBRIDEMENT TENNIS ELBOW  ?1970's   right   ESOPHAGOGASTRODUODENOSCOPY     EYE SURGERY  N/A    Phreesia 10/21/2019   HYSTERECTOMY     I & D EXTREMITY Right 09/22/2020   Procedure: IRRIGATION AND DEBRIDEMENT RIGHT HAND AND ELBOW;  Surgeon: Marchia Bond, MD;  Location: [facility];  Service: Orthopedics;  Laterality: Right;   KNEE ARTHROPLASTY  ?1990's   "?right; w/cartilage repair" (02/20/2012)   NASAL SEPTUM SURGERY  1980's   POSTERIOR CERVICAL FUSION/FORAMINOTOMY  2004   "failed initial fusion; rewired  anterior neck" (02/20/2012)   REVERSE SHOULDER ARTHROPLASTY Right 02/16/2020   Procedure: REVERSE SHOULDER ARTHROPLASTY;  Surgeon: Marchia Bond, MD;  Location: [facility];  Service: Orthopedics;  Laterality: Right;   RIGHT/LEFT HEART CATH AND CORONARY ANGIOGRAPHY N/A  01/11/2021   Procedure: RIGHT/LEFT HEART CATH AND CORONARY ANGIOGRAPHY;  Surgeon: Leonie Man, MD;  Location: [facility];  Service: Cardiovascular;  Laterality: N/A;   SPINE SURGERY N/A    Phreesia 10/21/2019   TONSILLECTOMY  ~ 1953   VESICOVAGINAL FISTULA CLOSURE W/ TAH  1988   VIDEO BRONCHOSCOPY Bilateral 08/23/2016   Procedure: VIDEO BRONCHOSCOPY WITH FLUORO;  Surgeon: Javier Glazier, MD;  [facility]: [facility];  Service: Cardiopulmonary;  Laterality: Bilateral;   VIDEO BRONCHOSCOPY N/A 04/05/2020   Procedure: VIDEO BRONCHOSCOPY WITHOUT FLUORO;  Surgeon: Freddi Starr, MD;  [facility];  Service: Pulmonary;  Laterality: N/A;    Allergies  Allergies  Allergen Reactions   Baclofen Other (See Comments)    Altered mental status requiring a 3-day hospital stay- patient became unresponsive   Dust Mite Extract Shortness Of Breath and Other (See Comments)    "sneezing" (02/20/2012)   Molds & Smuts Shortness Of Breath   Morphine And Related Hives and Itching   Other Shortness Of Breath and Other (See Comments)    Grass and weeds "sneezing; filled sinuses" (02/20/2012)   Penicillins Rash and Other (See Comments)    "welts" (02/20/2012) Tolerated Ancef 02/16/20.    Rofecoxib Swelling and Other (See Comments)    Vioxx- feet swelling    Shellfish Allergy Anaphylaxis, Shortness Of Breath, Itching and Rash   Tetracycline Hcl Nausea And Vomiting   Xolair [Omalizumab] Other (See Comments)    Caused Blood clot   Zoledronic Acid Other (See Comments)    Fever, Put in hospital, dr said it was a reaction from a reaction  Other reaction(s): Unknown   Dilaudid [Hydromorphone Hcl] Itching   Levofloxacin Other (See Comments)    GI upset   Oxycodone Hcl Nausea And Vomiting   Paroxetine Nausea And Vomiting and Other (See Comments)    Paxil    Celecoxib Swelling and Other (See Comments)    Feet swelling   Diltiazem Hcl Swelling   Hydrocodone-Acetaminophen Other (See  Comments)    Unknown reaction   Lactose Intolerance (Gi) Other (See Comments)    Bloating and gas   Morphine Sulfate Other (See Comments)    "Gets loopy"   Oxycodone-Acetaminophen Other (See Comments)    Unknown reaction   Rituximab     Anemia    Tree Extract Other (See Comments)    "tested and told I was allergic to it; never experienced a reaction to it" (02/20/2012)   Penicillin G Procaine Rash and Other (See Comments)    Welts, also    History of Present Illness    Ebony Scott is a 70 y.o. 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   CT chest June 2019 with diffuse calcification including aorta, aortic branch vessels, coronary arteries.  Normal Lexiscan in the past.   She was hospitalized July 2022 with acute renal failure, acute systolic and diastolic heart failure.  Echocardiogram showing new onset heart failure with LVEF 35-40%, wall motion abnormalities, LV moderately dilated, grade 2 diastolic dysfunction, elevated LVEDP, mild MR, moderate calcification of aortic valve without stenosis.   She was seen in follow-up 10/26/20.  [place], ACE/ARB/Arni/MRA were on hold due to recent kidney injury.  Cardiac catheterization was held due to kidney dysfunction. She was started on half tablet of Bidil 20-27.5mg  BID. She was encouraged to take Lasix 40mg  QD as prescribed.     She was started on weekly injections of IVIG by Dr. Neldon Mc for immunodeficiency. PCP reduced Lasix to 20mg  QD with 11/28/20 labs show K 4.2, creatinine 1.19, GFR 49, AST 48, ALT 48. Kidney function back to previous baseline.  '  She was seen in follow up 11/30/20 noting increased fatigue, stable dyspnea on exertion. Jardiance 10mg  QD was started for  optimization of HF therapy. Cardiac catheterization was scheduled due to reduced LVEF.   She underwent LHC 01/11/21 with DES to LAD. Discharged on Aspirin 81mg  QD x 1 month, Plavix 75mg  QD x 6 mos, and indefinite Xarelto due to hx of PE and DVT. She had moderate pulmonary hypertension and moderate to severely reduced cardiac function . She was recommended for Lasix 40mg  BID x 3 days then 40mg  QD.   She presents today for follow up after contacting office noting increased bruising at right groin venous access site. Reports no shortness of breath and stable dyspnea on exertion. Reports no chest pain, pressure, or tightness. No  orthopnea, PND. Reports no palpitations.  She completed Lasix 40mg  BID and has since been taking 20mg  QD. Weighing sporadically at home. Weight today up 4 lbs compared to previous. Tells me her bruising is improved. She removed her bandage a few days ago which had dry blood but no new bleeding. Soreness has improved and she is moving more. Tells me she has a "sore" in her nose which she is treating with neosporin and concerned about nasal congestion with possible sinus infection, no reported fever, encouraged to discuss with primary care.   EKGs/Labs/Other Studies Reviewed:   The following studies were reviewed today:  [facility] 01/11/21  Prox LAD lesion is 95% stenosed proximal to 1st Diag &  Mid LAD lesion is 90% stenosed following   Mid LAD lesion is 90% stenosed.   A drug-eluting stent was successfully placed covering both lesions using a STENT ONYX FRONTIER 2.25X34.   Post intervention, there is a 0% residual stenosis.   --------------------------------------------------------------   Hemodynamic findings consistent with moderate pulmonary hypertension.   There is no aortic valve stenosis.   SUMMARY Severe single-vessel CAD with 95% and 90% stenoses of the proximal and mid LAD at major 1st Diag branch.  Likely be culprit for reduced EF and anterior hypokinesis on  echo. Successful DES PCI of the LAD using an Onyx Frontier DES 2.25 mm x 34 mm postdilated in tapered fashion from 2.6 to 2.44mm.   TIMI III flow pre and post, reduced to 0% Otherwise minimal CAD in the Right dominant system. 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  Moderate to severely reduced cardiac function with Cardiac Output-Index of 4.05-2.59 by Fick and 3.75-2.40 by Thermal Dilution.   RECOMMENDATIONS Continue to titrate medical management of chronic Systolic and diastolic heart failure.  She was given 40 mg IV Lasix on the Cath Lab table.  Recommend that she takes 40 mg twice daily oral Lasix for 3 days postprocedure.   She will need close follow-up with either primary cardiologist or APP to initiate titration of medications. Clinically stable for d/c today   Echo 09/16/20 1. Septal apical and anterior wall hypokinesis EF decreased compard to TTE done 07/10/19 . Left ventricular ejection fraction, by estimation, is 35 to 40%. The left ventricle has moderately decreased function. The left ventricle demonstrates regional wall motion abnormalities (see scoring diagram/findings for description). The left ventricular internal cavity size was moderately dilated. Left ventricular diastolic parameters are consistent with Grade II diastolic dysfunction (pseudonormalization). Elevated left ventricular end-diastolic pressure.   2. Right ventricular systolic function is normal. The right ventricular size is normal.   3. Left atrial size was mildly dilated.   4. The mitral valve is abnormal. Mild mitral valve regurgitation. No evidence of mitral stenosis.   5. The aortic valve is tricuspid. There is moderate calcification of the aortic valve. Aortic valve regurgitation is not visualized. Mild to moderate aortic valve sclerosis/calcification is present, without any  evidence of aortic stenosis.   6. The inferior vena cava is normal in size with greater than 50% respiratory variability, suggesting right atrial pressure of 3 mmHg.   FINDINGS   Left Ventricle: Septal apical and anterior wall hypokinesis EF decreased compard to TTE done 07/10/19.   Echo 07/10/19 1. Hyperdynamic LV function; grade 1 diastolic dysfunction; moderate LVH;  trace AI.   2. Left ventricular ejection fraction, by estimation, is >75%. The left  ventricle has hyperdynamic function. The left ventricle has no regional  wall motion abnormalities. There is moderate left ventricular hypertrophy.  Left ventricular diastolic  parameters are consistent with Grade I diastolic dysfunction (impaired  relaxation).   3. Right ventricular systolic function is normal. The right ventricular  size is normal.   4. The mitral valve is normal in structure. No evidence of mitral valve  regurgitation. No evidence of mitral stenosis.   5. The aortic valve has an indeterminant number of cusps. Aortic valve  regurgitation is trivial. No aortic stenosis is present.   Echo 08/05/17 Study Conclusions   - Left ventricle: The cavity size was normal. There was mild focal   basal hypertrophy of the septum. Systolic function was vigorous.   The estimated ejection fraction was in the range of 65% to 70%.   Wall motion was normal; there were no regional wall motion   abnormalities. Doppler parameters are consistent with abnormal   left ventricular relaxation (grade 1 diastolic dysfunction).   Impressions:   - Definity used; vigorous LV systolic function; mild diastolic   dysfunction.   CT chest high resolution 6.17.19 FINDINGS: Cardiovascular: Heart size is normal. There is no significant pericardial fluid, thickening or pericardial calcification. There is aortic atherosclerosis, as well as atherosclerosis of the great vessels of the mediastinum and the coronary arteries, including calcified  atherosclerotic plaque in the left main, left anterior descending left circumflex coronary arteries.   IMPRESSION: 1. No findings to suggest interstitial lung disease. 2. Mild air trapping indicative of mild small airways disease. 3. Aortic atherosclerosis, in addition to left main and 2 vessel coronary artery disease. Please note that although the presence  of coronary artery calcium documents the presence of coronary artery disease, the severity of this disease and any potential stenosis cannot be assessed on this non-gated CT examination. Assessment for potential risk factor modification, dietary therapy or pharmacologic therapy may be warranted, if clinically indicated. 4. Additional incidental findings, as above.   Stress 2011:  Stress echo 11/2009 Study Conclusions - Stress ECG conclusions: There were no stress arrhythmias or conduction abnormalities. The stress ECG was normal. - Staged echo: There was no echocardiographic evidence for stress-induced ischemia. Impressions: - Nondiagnostic stress echocardiogram with nochest pain, no ST changes; patient did not achieve target heart rate; no   stress-induced wall motion abnormalities.   Had [place] 12/2009 due to nondiagnostic stress echo: Stress Procedure  The patient received IV Lexiscan 0.4 mg over 15-seconds with concurrent low level exercise and then Technetium 59m Tetrofosmin was injected at 30-seconds while the patient continued walking one more minute.  There were no significant changes with Lexiscan.  Quantitative spect images were obtained after a 45 minute delay.   QPS Raw Data Images:  Normal; no motion artifact; normal heart/lung ratio. Stress Images:  Normal homogeneous uptake in all areas of the myocardium. Rest Images:  Normal homogeneous uptake in all areas of the myocardium. Subtraction (SDS):  No evidence of ischemia. Transient Ischemic Dilatation:  1.15  (Normal <1.22)  Lung/Heart Ratio:  .27  (Normal <0.45)    Quantitative Gated Spect Images QGS EDV:  52 ml QGS ESV:  13 ml QGS EF:  75 % QGS cine images:  Normal motion   Findings Normal nuclear study  EKG:  EKG is ordered today.  The ekg ordered today demonstrates NSR 74 bpm with TWI to lateral leads.   Recent Labs: 09/23/2020: TSH 0.983 10/04/2020: Magnesium 3.1 10/09/2020: B Natriuretic Peptide 3,823.5 12/21/2020: ALT 55; BUN 30; Creatinine, Ser 1.15 01/09/2021: Platelets 260 01/11/2021: Hemoglobin 9.5; Hemoglobin 9.5; Potassium 3.7; Potassium 3.7; Sodium 142; Sodium 142  Recent Lipid Panel    Component Value Date/Time   CHOL 162 12/21/2020 0826   TRIG 210 (H) 12/21/2020 0826   HDL 64 12/21/2020 0826   CHOLHDL 2.5 12/21/2020 0826   CHOLHDL 8.4 09/23/2020 1253   VLDL UNABLE TO CALCULATE IF TRIGLYCERIDE OVER 400 mg/dL 09/23/2020 1253   LDLCALC 64 12/21/2020 0826   LDLCALC 77 07/04/2018 1017   LDLDIRECT 141.2 (H) 09/23/2020 1253    Home Medications   Current Meds  Medication Sig   acetaminophen (TYLENOL) 650 MG CR tablet Take 1,300 mg by mouth every 8 (eight) hours as needed for pain.   albuterol (PROVENTIL) (2.5 MG/3ML) 0.083% nebulizer solution Take 3 mLs (2.5 mg total) by nebulization every 4 (four) hours as needed for wheezing or shortness of breath.   albuterol (VENTOLIN HFA) 108 (90 Base) MCG/ACT inhaler Inhale 2 puffs into the lungs every 6 (six) hours as needed for wheezing or shortness of breath.   allopurinol (ZYLOPRIM) 100 MG tablet Take 100 mg by mouth daily.   Alpha-D-Galactosidase (BEANO PO) Take 2 tablets by mouth 3 (three) times daily with meals.   Alpha-Lipoic Acid 600 MG TABS Take 600 mg by mouth daily.   aspirin EC 81 MG tablet Take 81 mg by mouth daily. Swallow whole.   atorvastatin (LIPITOR) 80 MG tablet TAKE 1 TABLET BY MOUTH DAILY   azelastine (ASTELIN) 0.1 % nasal spray Use in each nostril as directed (Patient taking differently: 2 sprays daily as needed for allergies.)   benzonatate (TESSALON) 200 MG capsule  Take 1 capsule (  200 mg total) by mouth 3 (three) times daily as needed for cough. (Patient taking differently: Take 200 mg by mouth 2 (two) times daily as needed for cough.)   budesonide (PULMICORT) 0.5 MG/2ML nebulizer solution Take 2 mLs (0.5 mg total) by nebulization 2 (two) times daily as needed.   CALCIUM-MAGNESIUM PO Take 1 tablet by mouth daily.   cetirizine (ZYRTEC) 10 MG tablet Take 1 tablet (10 mg total) by mouth daily.   cholecalciferol (VITAMIN D) 25 MCG (1000 UNIT) tablet 1,000 Units daily.   clopidogrel (PLAVIX) 75 MG tablet Take 1 tablet (75 mg total) by mouth daily.   DEXILANT 60 MG capsule TAKE 1 CAPSULE BY MOUTH EACH MORNING   diclofenac Sodium (VOLTAREN) 1 % GEL Apply 1 application topically 4 (four) times daily as needed (pain).   empagliflozin (JARDIANCE) 10 MG TABS tablet Take 1 tablet (10 mg total) by mouth daily before breakfast.   EPINEPHrine 0.3 mg/0.3 mL IJ SOAJ injection USE AS DIRECTED BY YOUR PHYSICIAN INTRAMUSCULARLY AS NEEDED FOR ANAPHYLAXIS   famotidine (PEPCID) 20 MG tablet Take 1 tablet (20 mg total) by mouth daily.   fluticasone (FLONASE) 50 MCG/ACT nasal spray USE 2 SPRAYS IN EACH NOSTRIL DAILY (Patient taking differently: 2 sprays daily as needed for allergies.)   folic acid (FOLVITE) 1 MG tablet Take 1 mg by mouth daily.   furosemide (LASIX) 40 MG tablet Take 1 table twice a day for 3 days and then back to once daily.   Gelatin Capsules, Empty, (CAPSULE SIZE 1 LACTOSE) CAPS Take 3 tablets by mouth daily as needed (consuming dairy).   Glucose Blood (BLOOD GLUCOSE TEST STRIPS) STRP Please dispense per patient and insurance preference. Use as directed to monitor FSBS 4x . Dx: E11.65   guaiFENesin (MUCINEX) 600 MG 12 hr tablet Take 1 tablet (600 mg total) by mouth 2 (two) times daily as needed for cough or to loosen phlegm. (Patient taking differently: Take 600 mg by mouth 2 (two) times daily.)   HIZENTRA 2 GM/10ML SOLN Inject 1 Dose into the skin once a week.    magnesium oxide (MAG-OX) 400 MG tablet Take 400 mg by mouth daily.   montelukast (SINGULAIR) 10 MG tablet Take 1 tablet (10 mg total) by mouth daily.   Nystatin (GERHARDT'S BUTT CREAM) CREA Apply topically 3 times a day as needed for itching or irritation   nystatin (MYCOSTATIN) 100000 UNIT/ML suspension Use as directed 15 mLs in the mouth or throat at bedtime as needed (mouth sores).   nystatin ointment (MYCOSTATIN) Apply 1 application topically 2 (two) times daily as needed (yeast).   predniSONE (DELTASONE) 10 MG tablet Take 1 tablet (10 mg total) by mouth daily with breakfast. Take with additional 2 tabs of 1mg  Prednisone to equal 12mg  daily. (Patient taking differently: Take 10 mg by mouth daily with breakfast.)   Prenatal Vit-Fe Fumarate-FA (PRENATAL VITAMINS PO) Take 1 tablet by mouth daily.   PRESCRIPTION MEDICATION Place 4 drops into both ears See admin instructions. Chloramp- sulfu - ampho d-h droc 100-100-5 hydrocortisone, otic ear drops. Instill 4 drops in to each ear daily for the first 5 days of each month   Probiotic Product (ALIGN PO) Take 1 capsule by mouth daily.   Propylene Glycol (SYSTANE COMPLETE) 0.6 % SOLN Place 1 drop into both eyes 2 (two) times daily.   Tiotropium Bromide-Olodaterol (STIOLTO RESPIMAT) 2.5-2.5 MCG/ACT AERS Inhale 2 puffs into the lungs daily.   traMADol (ULTRAM) 50 MG tablet Take 1 tablet (50 mg total)  by mouth at bedtime as needed. (Patient taking differently: Take 50 mg by mouth 2 (two) times daily as needed for moderate pain.)   XARELTO 20 MG TABS tablet TAKE 1 TABLET BY MOUTH DAILY WITH SUPPER   Current Facility-Administered Medications for the 01/19/21 encounter (Office Visit) with Loel Dubonnet, NP  Medication   denosumab (PROLIA) injection 60 mg     Review of Systems      All other systems reviewed and are otherwise negative except as noted above.  Physical Exam    VS:  BP 124/82   Pulse 74  , BMI There is no height or weight on file to  calculate BMI.  Wt Readings from Last 3 Encounters:  01/11/21 127 lb (57.6 kg)  01/06/21 126 lb 12.8 oz (57.5 kg)  12/05/20 127 lb (57.6 kg)     GEN: Well nourished, well developed, in no acute distress. HEENT: normal. Neck: Supple, no JVD, carotid bruits, or masses. Cardiac: RRR, no murmurs, rubs, or gallops. No clubbing, cyanosis, edema.  Radials/PT 2+ and equal bilaterally.  Respiratory:  Respirations regular and unlabored, clear to auscultation bilaterally. GI: Soft, nontender, nondistended. MS: No deformity or atrophy. Skin: Warm and dry, no rash. R radial radial cath site with 2"x1" area of purple ecchymosis, no signs of infection, no hematoma. R groin venous access site non-tender, soft to touch, scattered purpled ecchymosis, no evidence of hematoma. Neuro:  Strength and sensation are intact. Psych: Normal affect.  Assessment & Plan    CAD - 01/11/21 s/p DES PCI of LAD. Recommended for Xarelto due to hx of DVT/PE, aspirin 81mg  x1 month, Plavix 75mg  QD x 6 mos. She will stop aspirin on 02/11/21. Reassurance provided regarding bruising which is expected given triple therapy. No evidence of hematoma. CBC today to ensure no significant bleeding as she does note darker stools than usual. No recurrent anginal symptoms. Heart healthy diet and regular cardiovascular exercise encouraged.    HFrEF/ICM - [facility] 01/11/21 with DES to RCA, moderate secondary pulmonary hypertension, moderate to severely reduced cardiac function. Echo 08/2020 reduced LVEF 35-40%. Unclear whether she has been taking Isosorbide dinitrate-Hydralazine 20-37.5mg  BID. Not noted til after visit - will send MyChart message. Would prefer continue for HF benefit. BP controlled today. Escalation of GDMT previously limited by relative hypotension as well as renal function. BMP, BNP today. Will increase Lasix to 40mg  QD  as she is up 4 lbs and was only taking 20mg . Pending lab results consider ARNI/MRA. Consider repeat echo 3 months  after optimization of GDMT. Daily weight, low salt diet, fluid restriction <2 L encouraged.   DM2 - Continue to follow with PCP. Recent A1c 8.1 presumed elevated due to recent prednisone use.  DJS9F - Careful titration of diuretic and antihypertensive.    LDL goal <70 - 12/21/20 total cholesterol 162, triglycerides 210, HDL 64, LDL 64. Continue Atorvastatin 80mg  QD. Denies myalgias. Triglycerides not at goal. Recommend reducing intake of carbohydrates, sweets, sugars. Consider Vascepa at follow up but will prioritize optimization of heart failure therapies.   Disposition: Follow up in 1 month(s) with Buford Dresser, MD or APP.  Signed, Loel Dubonnet, NP 01/19/2021, 2:04 PM [place]

## 2021-01-20 ENCOUNTER — Telehealth: Payer: Self-pay | Admitting: Family

## 2021-01-20 ENCOUNTER — Other Ambulatory Visit (HOSPITAL_BASED_OUTPATIENT_CLINIC_OR_DEPARTMENT_OTHER): Payer: Self-pay | Admitting: Family

## 2021-01-20 DIAGNOSIS — I5020 Unspecified systolic (congestive) heart failure: Secondary | ICD-10-CM

## 2021-01-20 DIAGNOSIS — N1831 Chronic kidney disease, stage 3a: Secondary | ICD-10-CM

## 2021-01-20 DIAGNOSIS — I25118 Atherosclerotic heart disease of native coronary artery with other forms of angina pectoris: Secondary | ICD-10-CM

## 2021-01-20 LAB — BASIC METABOLIC PANEL
BUN/Creatinine Ratio: 30 — ABNORMAL HIGH (ref 12–28)
BUN: 35 mg/dL — ABNORMAL HIGH (ref 8–27)
CO2: 19 mmol/L — ABNORMAL LOW (ref 20–29)
Calcium: 8.6 mg/dL — ABNORMAL LOW (ref 8.7–10.3)
Chloride: 104 mmol/L (ref 96–106)
Creatinine, Ser: 1.17 mg/dL — ABNORMAL HIGH (ref 0.57–1.00)
Glucose: 198 mg/dL — ABNORMAL HIGH (ref 70–99)
Potassium: 5.1 mmol/L (ref 3.5–5.2)
Sodium: 138 mmol/L (ref 134–144)
eGFR: 50 mL/min/{1.73_m2} — ABNORMAL LOW (ref >59)

## 2021-01-20 LAB — CBC
Hematocrit: 26.1 % — ABNORMAL LOW (ref 34.0–46.6)
Hemoglobin: 8.6 g/dL — ABNORMAL LOW (ref 11.1–15.9)
MCH: 33.6 pg — ABNORMAL HIGH (ref 26.6–33.0)
MCHC: 33 g/dL (ref 31.5–35.7)
MCV: 102 fL — ABNORMAL HIGH (ref 79–97)
NRBC: 1 % — ABNORMAL HIGH (ref 0–0)
Platelets: 252 10*3/uL (ref 150–450)
RBC: 2.56 x10E6/uL — CL (ref 3.77–5.28)
RDW: 16.4 % — ABNORMAL HIGH (ref 11.7–15.4)
WBC: 14.4 10*3/uL — ABNORMAL HIGH (ref 3.4–10.8)

## 2021-01-20 LAB — BRAIN NATRIURETIC PEPTIDE: BNP: 621.2 pg/mL — ABNORMAL HIGH (ref 0.0–100.0)

## 2021-01-20 NOTE — Addendum Note (Signed)
Addended by: Gerald Stabs on: 01/20/2021 04:47 PM   Modules accepted: Orders

## 2021-01-20 NOTE — Telephone Encounter (Signed)
Patient would like to know if Ebony Scott wants her to restart medication Bidil.  She states her PCP had her stop taking it.

## 2021-01-20 NOTE — Telephone Encounter (Signed)
Spoke with patient. Plan to check BP once per day over weekend and keep log. She will report BP on Tuesday. Will readdress whether to resume Bidil at that time.   Ebony Dubonnet, NP

## 2021-01-20 NOTE — Telephone Encounter (Signed)
Pt called to ask if she should still be taking Glipizide.   Per LOV 12/05/20 she was to start taking 2.5mg . Pt has not done this, she has continued the 5mg . Advised pt to begin the 2.5mg  and call us in 2 weeks to let us know how her BG is doing. Pt voiced understanding.

## 2021-01-22 NOTE — Progress Notes (Signed)
Office Visit Note  Patient: Ebony Scott             Date of Birth: 06-21-1950           MRN: [id number]             PCP: [name], MD Referring: [name], MD Visit Date: 01/23/2021   Subjective:  Follow-up ([place])   History of Present Illness: Ebony Scott is a 70 y.o. female here for follow up for seronegative RA minimally responsive to multiple DMARD treatments on prednisone 10 mg PO daily with acquired cushing's syndrome. She has noticed some increase in right hand MCP joint swelling but overall joints not dramatically changed with dose reduction. Since our visit she went for coronary artery stent placement after study for evaluating cardiomyopathy found significant stenosis. No anginal symptoms, she is not sure if any different but husband reports noticing some improvement in her dyspnea on exertion. She has had numerous other issues increased upper airway congestion and drainage. Sometimes productive coughing with this. She has had mild nasal bleeding with rhinitis and and sneezing issues.   Previous HPI 11/28/20 Ebony Scott is a 70 y.o. female here for iatrogenic cushing syndrome and history of rheumatoid arthritis with long term prednisone treatment, previously seen by Dr. Kathlene November at [facility] for this problem. Mostly had been taking treatments after hospitalization with severe joint inflammation in 2020. She has tried taking numerous DMARD treatments for years including actemra, simponi, abatacept, and rituximab so far without great response or tolerance. Chronic prednisone required for symptoms throughout this time but has taken these longstanding for asthma disease already. She has had some issue with recurrent infections with pulmonary and septic bursitis and more recently noninfectious allergy symptoms managed with Dr. Neldon Mc and Erin Fulling. She started on IVIG treatments with Dr. Neldon Mc for this identified recently, not sure if primary or related to previous medication    DMARD Hx Rituximab Abatacept Simponi Actemra IV   Review of Systems  Constitutional:  Positive for fatigue.  HENT:  Positive for mouth dryness.   Eyes:  Positive for dryness.  Respiratory:  Positive for shortness of breath.   Cardiovascular:  Positive for swelling in legs/feet.  Gastrointestinal:  Positive for constipation.  Endocrine: Positive for cold intolerance, heat intolerance, excessive thirst and increased urination.  Genitourinary:  Negative for difficulty urinating.  Musculoskeletal:  Positive for joint pain, joint pain, joint swelling, muscle weakness, morning stiffness and muscle tenderness.  Skin:  Negative for rash.  Allergic/Immunologic: Positive for susceptible to infections.  Neurological:  Positive for weakness.  Hematological:  Positive for bruising/bleeding tendency.  Psychiatric/Behavioral:  Negative for sleep disturbance.    PMFS History:  Patient Active Problem List   Diagnosis Date Noted   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[place])  01/11/2021   Coronary artery calcification seen on CAT scan 01/11/2021   ARF (acute renal failure) ([place]) 10/04/2020   Physical deconditioning 09/25/2020   History of DVT (deep vein thrombosis) 09/24/2020   Osteomyelitis of second toe of right foot ([place]) 09/23/2020   DMII (diabetes mellitus, type 2) ([place]) 09/23/2020   Cellulitis of right hand 09/22/2020   Septic olecranon bursitis of right elbow 09/22/2020   Abscess of dorsum of right hand 09/22/2020   Hoarseness 10/02/4816   Metabolic encephalopathy 56/31/4970   Acute encephalopathy    AMS (altered mental status) 02/23/2020   S/P reverse total shoulder arthroplasty, right    Hypokalemia    DOE (dyspnea on exertion) 02/18/2020  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   S/P reverse total shoulder arthroplasty, left 02/16/2020   Preoperative clearance 01/26/2020   Closed displaced fracture of metatarsal bone of right foot 01/20/2019   History of pulmonary embolus  (PE) 12/19/2018   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   Rheumatoid arthritis ([place])    Bilateral impacted cerumen 08/18/2018   Fungal otitis externa 08/18/2018   Asthmatic bronchitis with exacerbation 07/29/2018   Pulmonary embolism ([place]) 07/29/2018   Cellulitis of forearm    Fever of unknown origin (FUO) 02/10/2018   Abdominal pain 02/10/2018   Fracture of base of fifth metatarsal bone with routine healing, left    Closed fracture of fifth metatarsal bone of right foot, initial encounter 01/15/2018   DVT (deep venous thrombosis) ([place]) 01/14/2018   Hypothyroidism 01/14/2018   Depression 01/14/2018   Debility    Malnutrition of moderate degree 12/17/2017   Knee pain    History of breast cancer    Chronic pain syndrome    Fibromyalgia    Graves disease    Vasculitis ([place]) 12/13/2017   Rhabdomyolysis 12/13/2017   Dysphonia 08/10/2017   Pulmonary nodules    Chronic kidney disease, stage 3b ([place]) 04/15/2016   Hx of adenomatous colonic polyps 04/12/2016   Cough 02/09/2016   Opacity of lung on imaging study 11/03/2015   Severe persistent asthma 02/08/2015   Facial pain syndrome 02/08/2015   Insomnia 02/08/2015   Other allergic rhinitis 02/08/2015   LPRD (laryngopharyngeal reflux disease) 02/08/2015   Chronic combined systolic and diastolic CHF (congestive heart failure) ([place]) 02/08/2015   Benign paroxysmal positional vertigo 12/03/2012   Dizziness and giddiness 10/29/2012   Disequilibrium 10/29/2012   Pseudogout 02/24/2012   Pneumonia 05/31/2011   Irritable bowel syndrome 01/02/2010   Hyperlipidemia 12/23/2009   Hyperthyroidism 11/12/2006   Seasonal and perennial allergic rhinitis 11/12/2006   GERD 11/12/2006   NECK PAIN, CHRONIC 11/12/2006   OSTEOPOROSIS 11/12/2006   BREAST CANCER, HX OF 11/12/2006    Past Medical History:  Diagnosis Date   Abscess of dorsum of right hand 09/22/2020   Acute deep vein thrombosis (DVT) of right lower extremity ([place]) 12/13/2017   Allergic  rhinitis    Anemia    Angio-edema    Anxiety    pt denies   Arthritis    Phreesia 10/21/2019   Asthma    Phreesia 10/21/2019   Breast cancer ([place]) 1998   in remission   Cataract    REMOVED   Clostridium difficile colitis 01/14/2018   Clostridium difficile diarrhea 09/60/4540   Complication of anesthesia    "had hard time waking up from it several times" (02/20/2012)   Depression    "some; don't take anything for it" (02/20/2012)   Diverticulosis    DVT (deep venous thrombosis) ([place])    Fibromyalgia 11/2011   GERD (gastroesophageal reflux disease)    Graves disease    Headache(784.0)    "related to allergies; more at different times during the year" (02/20/2012)   Hemorrhoids    Hiatal hernia    back and neck   Hx of adenomatous colonic polyps 04/12/2016   Hypercholesteremia    good cholesterol is high   Hypothyroidism    IBS (irritable bowel syndrome)    Moderate persistent asthma    -FeV1 72% 2011, -IgE 102 2011, CT sinus Neg 2011   Osteoporosis    on reclast yearly   Peripheral vascular disease ([place]) 2019   DVTs   Pneumonia 04/2011; ~ 11/2011   "double; single" (02/20/2012)  Septic olecranon bursitis of right elbow 09/22/2020   Seronegative rheumatoid arthritis (HCC)    Dr. Lahoma Rocker   SIRS (systemic inflammatory response syndrome) ([place]) 02/10/2018   Tracheobronchomalacia     Family History  Problem Relation Age of Onset   Allergies Mother    Heart disease Mother    Arthritis Mother    Lung cancer Mother    Diabetes Mother    Allergies Father    Heart disease Father    Arthritis Father    Stroke Father    Diabetes Maternal Grandfather    Colitis Daughter    Colon cancer Other        Maternal half aunt/Maternal half uncle   Past Surgical History:  Procedure Laterality Date   ABDOMINAL HYSTERECTOMY N/A    Phreesia 10/21/2019   ANTERIOR AND POSTERIOR REPAIR  1990's   APPENDECTOMY     BREAST LUMPECTOMY  1998   left   BREAST SURGERY N/A    Phreesia  10/21/2019   BRONCHIAL WASHINGS  04/05/2020   Procedure: BRONCHIAL WASHINGS;  Surgeon: Freddi Starr, MD;  Location: [facility];  Service: Pulmonary;;   CARPOMETACARPEL ([place]) FUSION OF THUMB WITH AUTOGRAFT FROM RADIUS  ~ 2009   "both thumbs" (02/20/2012)   CATARACT EXTRACTION W/ INTRAOCULAR LENS  IMPLANT, BILATERAL  2012   CERVICAL DISCECTOMY  10/2001   C5-C6   CERVICAL FUSION  2003   C3-C4   CHOLECYSTECTOMY     COLONOSCOPY     CORONARY STENT INTERVENTION N/A 01/11/2021   Procedure: CORONARY STENT INTERVENTION;  Surgeon: Leonie Man, MD;  Location: [place] [facility];  Service: Cardiovascular;  Laterality: N/A;   DEBRIDEMENT TENNIS ELBOW  ?1970's   right   ESOPHAGOGASTRODUODENOSCOPY     EYE SURGERY N/A    Phreesia 10/21/2019   HYSTERECTOMY     I & D EXTREMITY Right 09/22/2020   Procedure: IRRIGATION AND DEBRIDEMENT RIGHT HAND AND ELBOW;  Surgeon: Marchia Bond, MD;  Location: [facility];  Service: Orthopedics;  Laterality: Right;   KNEE ARTHROPLASTY  ?1990's   "?right; w/cartilage repair" (02/20/2012)   NASAL SEPTUM SURGERY  1980's   POSTERIOR CERVICAL FUSION/FORAMINOTOMY  2004   "failed initial fusion; rewired  anterior neck" (02/20/2012)   REVERSE SHOULDER ARTHROPLASTY Right 02/16/2020   Procedure: REVERSE SHOULDER ARTHROPLASTY;  Surgeon: Marchia Bond, MD;  Location: [facility];  Service: Orthopedics;  Laterality: Right;   RIGHT/LEFT HEART CATH AND CORONARY ANGIOGRAPHY N/A 01/11/2021   Procedure: RIGHT/LEFT HEART CATH AND CORONARY ANGIOGRAPHY;  Surgeon: Leonie Man, MD;  Location: [place] [facility];  Service: Cardiovascular;  Laterality: N/A;   SPINE SURGERY N/A    Phreesia 10/21/2019   TONSILLECTOMY  ~ 1953   VESICOVAGINAL FISTULA CLOSURE W/ TAH  1988   VIDEO BRONCHOSCOPY Bilateral 08/23/2016   Procedure: VIDEO BRONCHOSCOPY WITH FLUORO;  Surgeon: Javier Glazier, MD;  [facility];  Service: Cardiopulmonary;  Laterality: Bilateral;   VIDEO BRONCHOSCOPY N/A  04/05/2020   Procedure: VIDEO BRONCHOSCOPY WITHOUT FLUORO;  Surgeon: Freddi Starr, MD;  [facility];  Service: Pulmonary;  Laterality: N/A;   Social History   Social History Narrative   Patient lives at home alone. Patient  divorced.    Patient has her BS degree.   Right handed.   Caffeine- sometimes coffee.      [facility]:   Born in [place], [place]. She worked as a Copywriter, advertising. She has no pets currently. She does have indoor plants. Previously had  mold in her home that was remediated. Carpet was removed.          Immunization History  Administered Date(s) Administered   DTaP 08/18/2013   Fluad Quad(high Dose 65+) 10/16/2018, 11/20/2019, 11/03/2020   Hepatitis A 09/04/2007, 03/02/2008   Hepatitis B 01/07/1985, 02/06/1985, 08/17/1985   Influenza Split 11/13/2010, 11/22/2011, 10/20/2012   Influenza Whole 11/14/2009, 11/21/2011   Influenza, High Dose Seasonal PF 11/09/2015, 11/27/2017   Influenza, Quadrivalent, Recombinant, Inj, Pf 11/20/2019   Influenza,inj,Quad PF,6+ Mos 11/06/2013, 11/09/2014, 11/06/2016   Meningococcal Conjugate 09/04/2007   PFIZER(Purple Top)SARS-COV-2 Vaccination 03/12/2019, 04/01/2019, 12/16/2019, 06/29/2020   Pneumococcal Conjugate-13 05/18/2013   Pneumococcal Polysaccharide-23 01/05/1994, 11/28/2011, 05/27/2017   Td 07/24/1995, 03/16/2005   Tdap 08/18/2013   Zoster Recombinat (Shingrix) 09/18/2017   Zoster, Live 03/02/2008     Objective: Vital Signs: BP (!) 116/51 (BP Location: Right Arm, Patient Position: Sitting, Cuff Size: Normal)   Pulse 88   Resp 20   Ht 5\' 1"  (1.549 m)   Wt 128 lb (58.1 kg)   BMI 24.19 kg/m    Physical Exam Cardiovascular:     Rate and Rhythm: Normal rate and regular rhythm.  Pulmonary:     Effort: Pulmonary effort is normal.     Comments: Inspiratory rhonchi upper breath souds Musculoskeletal:     Right lower leg: Edema present.     Left lower leg: Edema present.  Skin:    General: Skin  is warm and dry.     Findings: Bruising present.  Neurological:     Mental Status: She is alert.  Psychiatric:        Mood and Affect: Mood normal.     Musculoskeletal Exam:  Elbows full ROM no tenderness or swelling Wrists full ROM no tenderness or swelling Fingers mild swelling and tenderness of right second and third MCPs Knees mild tenderness to pressure and some pain with full extension and flexion range of motion Ankles full ROM no tenderness or swelling   CDAI Exam: CDAI Score: 11  Patient Global: 30 mm; Provider Global: 20 mm Swollen: 2 ; Tender: 4  Joint Exam 01/23/2021      Right  Left  MCP 2  Swollen Tender     MCP 3  Swollen Tender     Knee   Tender   Tender      Investigation: No additional findings.  Imaging: CARDIAC CATHETERIZATION  Result Date: 01/11/2021   Prox LAD lesion is 95% stenosed proximal to 1st Diag &  Mid LAD lesion is 90% stenosed following   Mid LAD lesion is 90% stenosed.   A drug-eluting stent was successfully placed covering both lesions using a STENT ONYX FRONTIER 2.25X34.   Post intervention, there is a 0% residual stenosis.   --------------------------------------------------------------   Hemodynamic findings consistent with moderate pulmonary hypertension.   There is no aortic valve stenosis. SUMMARY Severe single-vessel CAD with 95% and 90% stenoses of the proximal and mid LAD at major 1st Diag branch.  Likely be culprit for reduced EF and anterior hypokinesis on echo. Successful DES PCI of the LAD using an Onyx Frontier DES 2.25 mm x 34 mm postdilated in tapered fashion from 2.6 to 2.51mm.  TIMI III flow pre and post, reduced to 0% Otherwise minimal CAD in the Right dominant system. 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 Moderate to severely reduced cardiac function with Cardiac  Output-Index of  4.05-2.59 by Fick and 3.75-2.40 by Thermal Dilution. RECOMMENDATIONS Continue to titrate medical management of chronic Systolic and diastolic heart failure.  She was given 40 mg IV Lasix on the Cath Lab table.  Recommend that she takes 40 mg twice daily oral Lasix for 3 days postprocedure.  She will need close follow-up with either primary cardiologist or APP to initiate titration of medications. Clinically stable for d/c today Glenetta Hew, MD   Recent Labs: Lab Results  Component Value Date   WBC 17.3 (H) 01/23/2021   HGB 8.3 (L) 01/23/2021   PLT 238 01/23/2021   NA 142 01/23/2021   K 4.9 01/23/2021   CL 103 01/23/2021   CO2 28 01/23/2021   GLUCOSE 102 (H) 01/23/2021   BUN 42 (H) 01/23/2021   CREATININE 1.44 (H) 01/23/2021   BILITOT 0.3 12/21/2020   ALKPHOS 117 12/21/2020   AST 52 (H) 12/21/2020   ALT 55 (H) 12/21/2020   PROT 6.0 12/21/2020   ALBUMIN 3.8 12/21/2020   CALCIUM 9.3 01/23/2021   GFRAA 40 (L) 08/25/2020    Speciality Comments: No specialty comments available.  Procedures:  No procedures performed Allergies: Baclofen, Dust mite extract, Molds & smuts, Morphine and related, Other, Penicillins, Rofecoxib, Shellfish allergy, Tetracycline hcl, Xolair [omalizumab], Zoledronic acid, Dilaudid [hydromorphone hcl], Levofloxacin, Oxycodone hcl, Paroxetine, Celecoxib, Diltiazem hcl, Hydrocodone-acetaminophen, Lactose intolerance (gi), Morphine sulfate, Oxycodone-acetaminophen, Rituximab, Tree extract, and Penicillin g procaine   Assessment / Plan:     Visit Diagnoses: Rheumatoid arthritis with negative rheumatoid factor, involving unspecified site ([place]) - Plan: Sedimentation rate, C-reactive protein, CBC with Differential/Platelet, BASIC METABOLIC PANEL WITH GFR  Inflammatory arthritis appears reasonably well controlled today stiff he also has some noninflammatory pain.  She is continuing the IVIG treatment with allergy clinic and would like to get to a lower dose of prednisone  due to increased cardiovascular risk especially with recent stent. Checking inflammatory markers today will attempt to taper prednisone further at low increments 1 mg daily dose per month unless this causes significant symptoms increase.  Fibromyalgia Chronic pain syndrome  Some degree of continued pain not correlated with inflammation changes continues not in particular exacerbation today.  She has had major interval medical events contributory.  Chronic kidney disease, stage 3b ([place]) - Plan: Sedimentation rate, C-reactive protein, CBC with Differential/Platelet, BASIC METABOLIC PANEL WITH GFR  Some heart failure chronic renal disease edema on diuretics with long-term use of glucocorticoids we will recheck metabolic panel today.  Orders: Orders Placed This Encounter  Procedures   Sedimentation rate   C-reactive protein   CBC with Differential/Platelet   BASIC METABOLIC PANEL WITH GFR    No orders of the defined types were placed in this encounter.    Follow-Up Instructions: Return in about 3 months (around 04/23/2021) for Inflammatory arthritis/long term GC prednisone taper f/u 58mos.   Collier Salina, MD  Note - This record has been created using [employer].  Chart creation errors have been sought, but may not always  have been located. Such creation errors do not reflect on  the standard of medical care.

## 2021-01-23 ENCOUNTER — Other Ambulatory Visit (HOSPITAL_COMMUNITY): Payer: Self-pay

## 2021-01-23 ENCOUNTER — Encounter: Payer: Self-pay | Admitting: Internal Medicine

## 2021-01-23 ENCOUNTER — Telehealth (HOSPITAL_COMMUNITY): Payer: Self-pay

## 2021-01-23 ENCOUNTER — Ambulatory Visit (INDEPENDENT_AMBULATORY_CARE_PROVIDER_SITE_OTHER): Payer: Medicare PPO | Admitting: Internal Medicine

## 2021-01-23 ENCOUNTER — Other Ambulatory Visit: Payer: Self-pay

## 2021-01-23 VITALS — BP 116/51 | HR 88 | Resp 20 | Ht 61.0 in | Wt 128.0 lb

## 2021-01-23 DIAGNOSIS — M0600 Rheumatoid arthritis without rheumatoid factor, unspecified site: Secondary | ICD-10-CM

## 2021-01-23 DIAGNOSIS — N1832 Chronic kidney disease, stage 3b: Secondary | ICD-10-CM | POA: Diagnosis not present

## 2021-01-23 DIAGNOSIS — M797 Fibromyalgia: Secondary | ICD-10-CM | POA: Diagnosis not present

## 2021-01-23 DIAGNOSIS — G894 Chronic pain syndrome: Secondary | ICD-10-CM

## 2021-01-23 NOTE — Telephone Encounter (Signed)
Transitions of Care Pharmacy  ° °Call attempted for a pharmacy transitions of care follow-up. HIPAA appropriate voicemail was left with call back information provided.  ° °Call attempt #1. Will follow-up in 2-3 days.  °  °

## 2021-01-23 NOTE — Telephone Encounter (Signed)
Pharmacy Transitions of Care Follow-up Telephone Call  Date of discharge: 01/11/21  Discharge Diagnosis: ischemic congestive cardiomyopathy  How have you been since you were released from the hospital?  Patient well since discharge, no questions about meds at this time.  Medication changes made at discharge:  - START: clopidogrel  - STOPPED: lasix   Medication changes verified by the patient? Yes    Medication Accessibility:  Per patient they do not need any refills or tranfers at this time.    Medication Review:  Patient had no questions about medications and did not want to go over medication at this time.   Follow-up Appointments:  [facility] f/u appt confirmed? Seen by Dr. Benjamine Mola on 01/23/21. Scheduled to see Dr. Gilford Rile on 02/24/21 @ 11:20am.   If their condition worsens, is the pt aware to call PCP or go to the Emergency Dept.? Yes  Final Patient Assessment: Patient has f/u scheduled

## 2021-01-23 NOTE — Patient Instructions (Signed)
I am rechecking lab tests if inflammation markers are doing well we can try further tapering down the prednisone. Will call after reviewing results.  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 This could be a primary problem but likely related to the rituximab treatment in your case.

## 2021-01-24 ENCOUNTER — Telehealth (HOSPITAL_COMMUNITY): Payer: Self-pay

## 2021-01-24 LAB — CBC WITH DIFFERENTIAL/PLATELET
Absolute Monocytes: 986 cells/uL — ABNORMAL HIGH (ref 200–950)
Basophils Absolute: 35 cells/uL (ref 0–200)
Basophils Relative: 0.2 %
Eosinophils Absolute: 87 cells/uL (ref 15–500)
Eosinophils Relative: 0.5 %
HCT: 25.2 % — ABNORMAL LOW (ref 35.0–45.0)
Hemoglobin: 8.3 g/dL — ABNORMAL LOW (ref 11.7–15.5)
Lymphs Abs: 727 cells/uL — ABNORMAL LOW (ref 850–3900)
MCH: 34.3 pg — ABNORMAL HIGH (ref 27.0–33.0)
MCHC: 32.9 g/dL (ref 32.0–36.0)
MCV: 104.1 fL — ABNORMAL HIGH (ref 80.0–100.0)
MPV: 11.4 fL (ref 7.5–12.5)
Monocytes Relative: 5.7 %
Neutro Abs: 15466 cells/uL — ABNORMAL HIGH (ref 1500–7800)
Neutrophils Relative %: 89.4 %
Platelets: 238 10*3/uL (ref 140–400)
RBC: 2.42 10*6/uL — ABNORMAL LOW (ref 3.80–5.10)
RDW: 16.1 % — ABNORMAL HIGH (ref 11.0–15.0)
Total Lymphocyte: 4.2 %
WBC: 17.3 10*3/uL — ABNORMAL HIGH (ref 3.8–10.8)

## 2021-01-24 LAB — BASIC METABOLIC PANEL WITH GFR
BUN/Creatinine Ratio: 29 (calc) — ABNORMAL HIGH (ref 6–22)
BUN: 42 mg/dL — ABNORMAL HIGH (ref 7–25)
CO2: 28 mmol/L (ref 20–32)
Calcium: 9.3 mg/dL (ref 8.6–10.4)
Chloride: 103 mmol/L (ref 98–110)
Creat: 1.44 mg/dL — ABNORMAL HIGH (ref 0.60–1.00)
Glucose, Bld: 102 mg/dL — ABNORMAL HIGH (ref 65–99)
Potassium: 4.9 mmol/L (ref 3.5–5.3)
Sodium: 142 mmol/L (ref 135–146)
eGFR: 39 mL/min/{1.73_m2} — ABNORMAL LOW (ref >=60)

## 2021-01-24 LAB — C-REACTIVE PROTEIN: CRP: 17.5 mg/L — ABNORMAL HIGH (ref <8.0)

## 2021-01-24 LAB — SEDIMENTATION RATE: Sed Rate: 77 mm/h — ABNORMAL HIGH (ref 0–30)

## 2021-01-24 NOTE — Telephone Encounter (Signed)
Will check insurance benefits closer to scheduling and/or into the new year 2023. 

## 2021-01-26 DIAGNOSIS — J479 Bronchiectasis, uncomplicated: Secondary | ICD-10-CM | POA: Diagnosis not present

## 2021-01-26 DIAGNOSIS — F32A Depression, unspecified: Secondary | ICD-10-CM | POA: Diagnosis not present

## 2021-01-26 DIAGNOSIS — N1832 Chronic kidney disease, stage 3b: Secondary | ICD-10-CM | POA: Diagnosis not present

## 2021-01-26 DIAGNOSIS — M71121 Other infective bursitis, right elbow: Secondary | ICD-10-CM | POA: Diagnosis not present

## 2021-01-26 DIAGNOSIS — K219 Gastro-esophageal reflux disease without esophagitis: Secondary | ICD-10-CM | POA: Diagnosis not present

## 2021-01-26 DIAGNOSIS — E1122 Type 2 diabetes mellitus with diabetic chronic kidney disease: Secondary | ICD-10-CM | POA: Diagnosis not present

## 2021-01-26 DIAGNOSIS — E1151 Type 2 diabetes mellitus with diabetic peripheral angiopathy without gangrene: Secondary | ICD-10-CM | POA: Diagnosis not present

## 2021-01-26 DIAGNOSIS — M069 Rheumatoid arthritis, unspecified: Secondary | ICD-10-CM | POA: Diagnosis not present

## 2021-01-26 DIAGNOSIS — I5042 Chronic combined systolic (congestive) and diastolic (congestive) heart failure: Secondary | ICD-10-CM | POA: Diagnosis not present

## 2021-01-27 ENCOUNTER — Ambulatory Visit (HOSPITAL_BASED_OUTPATIENT_CLINIC_OR_DEPARTMENT_OTHER): Payer: Medicare PPO | Admitting: Family

## 2021-01-27 DIAGNOSIS — M069 Rheumatoid arthritis, unspecified: Secondary | ICD-10-CM | POA: Diagnosis not present

## 2021-01-27 DIAGNOSIS — E1151 Type 2 diabetes mellitus with diabetic peripheral angiopathy without gangrene: Secondary | ICD-10-CM | POA: Diagnosis not present

## 2021-01-27 DIAGNOSIS — M71121 Other infective bursitis, right elbow: Secondary | ICD-10-CM | POA: Diagnosis not present

## 2021-01-27 DIAGNOSIS — J479 Bronchiectasis, uncomplicated: Secondary | ICD-10-CM | POA: Diagnosis not present

## 2021-01-27 DIAGNOSIS — K219 Gastro-esophageal reflux disease without esophagitis: Secondary | ICD-10-CM | POA: Diagnosis not present

## 2021-01-27 DIAGNOSIS — F32A Depression, unspecified: Secondary | ICD-10-CM | POA: Diagnosis not present

## 2021-01-27 DIAGNOSIS — E1122 Type 2 diabetes mellitus with diabetic chronic kidney disease: Secondary | ICD-10-CM | POA: Diagnosis not present

## 2021-01-27 DIAGNOSIS — I5042 Chronic combined systolic (congestive) and diastolic (congestive) heart failure: Secondary | ICD-10-CM | POA: Diagnosis not present

## 2021-01-27 DIAGNOSIS — N1832 Chronic kidney disease, stage 3b: Secondary | ICD-10-CM | POA: Diagnosis not present

## 2021-01-30 ENCOUNTER — Telehealth: Payer: Self-pay

## 2021-01-30 NOTE — Telephone Encounter (Signed)
Ebony Scott w/[facility] calling for verbal orders for OT. Pt has missed 7 visits and they would like orders to make these up.   Verbal approval for OT x7 visits given. Ebony Scott states they are waiting on pt's insurance to also provide approval. Nothing further needed at this time.

## 2021-02-02 DIAGNOSIS — E1151 Type 2 diabetes mellitus with diabetic peripheral angiopathy without gangrene: Secondary | ICD-10-CM | POA: Diagnosis not present

## 2021-02-02 DIAGNOSIS — K219 Gastro-esophageal reflux disease without esophagitis: Secondary | ICD-10-CM | POA: Diagnosis not present

## 2021-02-02 DIAGNOSIS — N1832 Chronic kidney disease, stage 3b: Secondary | ICD-10-CM | POA: Diagnosis not present

## 2021-02-02 DIAGNOSIS — M71121 Other infective bursitis, right elbow: Secondary | ICD-10-CM | POA: Diagnosis not present

## 2021-02-02 DIAGNOSIS — M069 Rheumatoid arthritis, unspecified: Secondary | ICD-10-CM | POA: Diagnosis not present

## 2021-02-02 DIAGNOSIS — F32A Depression, unspecified: Secondary | ICD-10-CM | POA: Diagnosis not present

## 2021-02-02 DIAGNOSIS — I5042 Chronic combined systolic (congestive) and diastolic (congestive) heart failure: Secondary | ICD-10-CM | POA: Diagnosis not present

## 2021-02-02 DIAGNOSIS — E1122 Type 2 diabetes mellitus with diabetic chronic kidney disease: Secondary | ICD-10-CM | POA: Diagnosis not present

## 2021-02-02 DIAGNOSIS — J479 Bronchiectasis, uncomplicated: Secondary | ICD-10-CM | POA: Diagnosis not present

## 2021-02-03 DIAGNOSIS — I25118 Atherosclerotic heart disease of native coronary artery with other forms of angina pectoris: Secondary | ICD-10-CM | POA: Diagnosis not present

## 2021-02-03 DIAGNOSIS — N1831 Chronic kidney disease, stage 3a: Secondary | ICD-10-CM | POA: Diagnosis not present

## 2021-02-03 DIAGNOSIS — I5020 Unspecified systolic (congestive) heart failure: Secondary | ICD-10-CM | POA: Diagnosis not present

## 2021-02-04 ENCOUNTER — Inpatient Hospital Stay (HOSPITAL_COMMUNITY)
Admission: EM | Admit: 2021-02-04 | Discharge: 2021-02-07 | DRG: 378 | Disposition: A | Discharge status: Home Health | Payer: Medicare PPO | Attending: Internal Medicine | Admitting: Internal Medicine

## 2021-02-04 ENCOUNTER — Emergency Department (HOSPITAL_COMMUNITY): Payer: Medicare PPO

## 2021-02-04 ENCOUNTER — Telehealth (HOSPITAL_BASED_OUTPATIENT_CLINIC_OR_DEPARTMENT_OTHER): Payer: Self-pay | Admitting: Family

## 2021-02-04 ENCOUNTER — Encounter (HOSPITAL_COMMUNITY): Payer: Self-pay | Admitting: Internal Medicine

## 2021-02-04 ENCOUNTER — Other Ambulatory Visit: Payer: Self-pay

## 2021-02-04 DIAGNOSIS — M0600 Rheumatoid arthritis without rheumatoid factor, unspecified site: Secondary | ICD-10-CM | POA: Diagnosis present

## 2021-02-04 DIAGNOSIS — Z833 Family history of diabetes mellitus: Secondary | ICD-10-CM

## 2021-02-04 DIAGNOSIS — E7800 Pure hypercholesterolemia, unspecified: Secondary | ICD-10-CM | POA: Diagnosis present

## 2021-02-04 DIAGNOSIS — K31819 Angiodysplasia of stomach and duodenum without bleeding: Secondary | ICD-10-CM | POA: Diagnosis not present

## 2021-02-04 DIAGNOSIS — E0500 Thyrotoxicosis with diffuse goiter without thyrotoxic crisis or storm: Secondary | ICD-10-CM | POA: Diagnosis present

## 2021-02-04 DIAGNOSIS — E785 Hyperlipidemia, unspecified: Secondary | ICD-10-CM | POA: Diagnosis present

## 2021-02-04 DIAGNOSIS — K31811 Angiodysplasia of stomach and duodenum with bleeding: Secondary | ICD-10-CM | POA: Diagnosis present

## 2021-02-04 DIAGNOSIS — Z7952 Long term (current) use of systemic steroids: Secondary | ICD-10-CM

## 2021-02-04 DIAGNOSIS — F32A Depression, unspecified: Secondary | ICD-10-CM | POA: Diagnosis present

## 2021-02-04 DIAGNOSIS — N1832 Chronic kidney disease, stage 3b: Secondary | ICD-10-CM | POA: Diagnosis present

## 2021-02-04 DIAGNOSIS — E1151 Type 2 diabetes mellitus with diabetic peripheral angiopathy without gangrene: Secondary | ICD-10-CM | POA: Diagnosis present

## 2021-02-04 DIAGNOSIS — Z880 Allergy status to penicillin: Secondary | ICD-10-CM

## 2021-02-04 DIAGNOSIS — M810 Age-related osteoporosis without current pathological fracture: Secondary | ICD-10-CM | POA: Diagnosis present

## 2021-02-04 DIAGNOSIS — Z955 Presence of coronary angioplasty implant and graft: Secondary | ICD-10-CM

## 2021-02-04 DIAGNOSIS — I5042 Chronic combined systolic (congestive) and diastolic (congestive) heart failure: Secondary | ICD-10-CM | POA: Diagnosis present

## 2021-02-04 DIAGNOSIS — E249 Cushing's syndrome, unspecified: Secondary | ICD-10-CM | POA: Diagnosis present

## 2021-02-04 DIAGNOSIS — M797 Fibromyalgia: Secondary | ICD-10-CM | POA: Diagnosis present

## 2021-02-04 DIAGNOSIS — K219 Gastro-esophageal reflux disease without esophagitis: Secondary | ICD-10-CM | POA: Diagnosis present

## 2021-02-04 DIAGNOSIS — Z91048 Other nonmedicinal substance allergy status: Secondary | ICD-10-CM

## 2021-02-04 DIAGNOSIS — Z8249 Family history of ischemic heart disease and other diseases of the circulatory system: Secondary | ICD-10-CM

## 2021-02-04 DIAGNOSIS — Z7901 Long term (current) use of anticoagulants: Secondary | ICD-10-CM

## 2021-02-04 DIAGNOSIS — E119 Type 2 diabetes mellitus without complications: Secondary | ICD-10-CM

## 2021-02-04 DIAGNOSIS — I5043 Acute on chronic combined systolic (congestive) and diastolic (congestive) heart failure: Secondary | ICD-10-CM | POA: Diagnosis present

## 2021-02-04 DIAGNOSIS — Z7984 Long term (current) use of oral hypoglycemic drugs: Secondary | ICD-10-CM

## 2021-02-04 DIAGNOSIS — I255 Ischemic cardiomyopathy: Secondary | ICD-10-CM | POA: Diagnosis present

## 2021-02-04 DIAGNOSIS — E1122 Type 2 diabetes mellitus with diabetic chronic kidney disease: Secondary | ICD-10-CM | POA: Diagnosis present

## 2021-02-04 DIAGNOSIS — J4540 Moderate persistent asthma, uncomplicated: Secondary | ICD-10-CM | POA: Diagnosis present

## 2021-02-04 DIAGNOSIS — I517 Cardiomegaly: Secondary | ICD-10-CM | POA: Diagnosis not present

## 2021-02-04 DIAGNOSIS — R0602 Shortness of breath: Secondary | ICD-10-CM | POA: Diagnosis not present

## 2021-02-04 DIAGNOSIS — Z981 Arthrodesis status: Secondary | ICD-10-CM

## 2021-02-04 DIAGNOSIS — Z885 Allergy status to narcotic agent status: Secondary | ICD-10-CM

## 2021-02-04 DIAGNOSIS — K449 Diaphragmatic hernia without obstruction or gangrene: Secondary | ICD-10-CM | POA: Diagnosis not present

## 2021-02-04 DIAGNOSIS — Z7982 Long term (current) use of aspirin: Secondary | ICD-10-CM

## 2021-02-04 DIAGNOSIS — Z86718 Personal history of other venous thrombosis and embolism: Secondary | ICD-10-CM

## 2021-02-04 DIAGNOSIS — D649 Anemia, unspecified: Secondary | ICD-10-CM | POA: Diagnosis present

## 2021-02-04 DIAGNOSIS — D62 Acute posthemorrhagic anemia: Secondary | ICD-10-CM | POA: Diagnosis present

## 2021-02-04 DIAGNOSIS — Z853 Personal history of malignant neoplasm of breast: Secondary | ICD-10-CM

## 2021-02-04 DIAGNOSIS — I5022 Chronic systolic (congestive) heart failure: Secondary | ICD-10-CM | POA: Diagnosis present

## 2021-02-04 DIAGNOSIS — Z79899 Other long term (current) drug therapy: Secondary | ICD-10-CM

## 2021-02-04 DIAGNOSIS — Z7902 Long term (current) use of antithrombotics/antiplatelets: Secondary | ICD-10-CM | POA: Insufficient documentation

## 2021-02-04 DIAGNOSIS — Z881 Allergy status to other antibiotic agents status: Secondary | ICD-10-CM

## 2021-02-04 DIAGNOSIS — K5791 Diverticulosis of intestine, part unspecified, without perforation or abscess with bleeding: Secondary | ICD-10-CM | POA: Diagnosis present

## 2021-02-04 DIAGNOSIS — Z86711 Personal history of pulmonary embolism: Secondary | ICD-10-CM | POA: Diagnosis present

## 2021-02-04 DIAGNOSIS — F419 Anxiety disorder, unspecified: Secondary | ICD-10-CM | POA: Diagnosis present

## 2021-02-04 DIAGNOSIS — G894 Chronic pain syndrome: Secondary | ICD-10-CM | POA: Diagnosis present

## 2021-02-04 DIAGNOSIS — J45909 Unspecified asthma, uncomplicated: Secondary | ICD-10-CM | POA: Diagnosis not present

## 2021-02-04 DIAGNOSIS — E039 Hypothyroidism, unspecified: Secondary | ICD-10-CM | POA: Diagnosis present

## 2021-02-04 DIAGNOSIS — Z20822 Contact with and (suspected) exposure to covid-19: Secondary | ICD-10-CM | POA: Diagnosis present

## 2021-02-04 DIAGNOSIS — Z91013 Allergy to seafood: Secondary | ICD-10-CM

## 2021-02-04 DIAGNOSIS — Z7951 Long term (current) use of inhaled steroids: Secondary | ICD-10-CM

## 2021-02-04 DIAGNOSIS — N183 Chronic kidney disease, stage 3 unspecified: Secondary | ICD-10-CM

## 2021-02-04 DIAGNOSIS — D500 Iron deficiency anemia secondary to blood loss (chronic): Secondary | ICD-10-CM | POA: Diagnosis not present

## 2021-02-04 DIAGNOSIS — Z888 Allergy status to other drugs, medicaments and biological substances status: Secondary | ICD-10-CM

## 2021-02-04 DIAGNOSIS — I2510 Atherosclerotic heart disease of native coronary artery without angina pectoris: Secondary | ICD-10-CM | POA: Diagnosis present

## 2021-02-04 LAB — CBC WITH DIFFERENTIAL/PLATELET
Abs Immature Granulocytes: 0.15 10*3/uL — ABNORMAL HIGH (ref 0.00–0.07)
Basophils Absolute: 0.1 10*3/uL (ref 0.0–0.1)
Basophils Relative: 0 %
Eosinophils Absolute: 0.3 10*3/uL (ref 0.0–0.5)
Eosinophils Relative: 2 %
HCT: 21.4 % — ABNORMAL LOW (ref 36.0–46.0)
Hemoglobin: 6.3 g/dL — CL (ref 12.0–15.0)
Immature Granulocytes: 1 %
Lymphocytes Relative: 5 %
Lymphs Abs: 0.8 10*3/uL (ref 0.7–4.0)
MCH: 31.5 pg (ref 26.0–34.0)
MCHC: 29.4 g/dL — ABNORMAL LOW (ref 30.0–36.0)
MCV: 107 fL — ABNORMAL HIGH (ref 80.0–100.0)
Monocytes Absolute: 1.8 10*3/uL — ABNORMAL HIGH (ref 0.1–1.0)
Monocytes Relative: 10 %
Neutro Abs: 14.9 10*3/uL — ABNORMAL HIGH (ref 1.7–7.7)
Neutrophils Relative %: 82 %
Platelets: 317 10*3/uL (ref 150–400)
RBC: 2 MIL/uL — ABNORMAL LOW (ref 3.87–5.11)
RDW: 18.1 % — ABNORMAL HIGH (ref 11.5–15.5)
WBC: 18 10*3/uL — ABNORMAL HIGH (ref 4.0–10.5)
nRBC: 0.2 % (ref 0.0–0.2)

## 2021-02-04 LAB — CBC
Hematocrit: 20.6 % — ABNORMAL LOW (ref 34.0–46.6)
Hemoglobin: 6.4 g/dL — CL (ref 11.1–15.9)
MCH: 31.5 pg (ref 26.6–33.0)
MCHC: 31.1 g/dL — ABNORMAL LOW (ref 31.5–35.7)
MCV: 102 fL — ABNORMAL HIGH (ref 79–97)
Platelets: 306 10*3/uL (ref 150–450)
RBC: 2.03 x10E6/uL — CL (ref 3.77–5.28)
RDW: 16.3 % — ABNORMAL HIGH (ref 11.7–15.4)
WBC: 11.2 10*3/uL — ABNORMAL HIGH (ref 3.4–10.8)

## 2021-02-04 LAB — ABO/RH: ABO/RH(D): A POS

## 2021-02-04 LAB — COMPREHENSIVE METABOLIC PANEL
ALT: 33 U/L (ref 0–44)
AST: 30 U/L (ref 15–41)
Albumin: 3.2 g/dL — ABNORMAL LOW (ref 3.5–5.0)
Alkaline Phosphatase: 66 U/L (ref 38–126)
Anion gap: 9 (ref 5–15)
BUN: 38 mg/dL — ABNORMAL HIGH (ref 8–23)
CO2: 26 mmol/L (ref 22–32)
Calcium: 8.5 mg/dL — ABNORMAL LOW (ref 8.9–10.3)
Chloride: 102 mmol/L (ref 98–111)
Creatinine, Ser: 1.35 mg/dL — ABNORMAL HIGH (ref 0.44–1.00)
GFR, Estimated: 42 mL/min — ABNORMAL LOW (ref >60)
Glucose, Bld: 164 mg/dL — ABNORMAL HIGH (ref 70–99)
Potassium: 3.7 mmol/L (ref 3.5–5.1)
Sodium: 137 mmol/L (ref 135–145)
Total Bilirubin: 0.6 mg/dL (ref 0.3–1.2)
Total Protein: 6.5 g/dL (ref 6.5–8.1)

## 2021-02-04 LAB — PROTIME-INR
INR: 1.1 (ref 0.8–1.2)
Prothrombin Time: 14.1 seconds (ref 11.4–15.2)

## 2021-02-04 LAB — BASIC METABOLIC PANEL
BUN/Creatinine Ratio: 30 — ABNORMAL HIGH (ref 12–28)
BUN: 34 mg/dL — ABNORMAL HIGH (ref 8–27)
CO2: 20 mmol/L (ref 20–29)
Calcium: 8.7 mg/dL (ref 8.7–10.3)
Chloride: 102 mmol/L (ref 96–106)
Creatinine, Ser: 1.14 mg/dL — ABNORMAL HIGH (ref 0.57–1.00)
Glucose: 123 mg/dL — ABNORMAL HIGH (ref 70–99)
Potassium: 4.4 mmol/L (ref 3.5–5.2)
Sodium: 141 mmol/L (ref 134–144)
eGFR: 51 mL/min/{1.73_m2} — ABNORMAL LOW (ref >59)

## 2021-02-04 LAB — RESP PANEL BY RT-PCR (FLU A&B, COVID) ARPGX2
Influenza A by PCR: NEGATIVE
Influenza B by PCR: NEGATIVE
SARS Coronavirus 2 by RT PCR: NEGATIVE

## 2021-02-04 LAB — HEMOGLOBIN AND HEMATOCRIT, BLOOD
HCT: 23.3 % — ABNORMAL LOW (ref 36.0–46.0)
Hemoglobin: 7.3 g/dL — ABNORMAL LOW (ref 12.0–15.0)

## 2021-02-04 LAB — PREPARE RBC (CROSSMATCH)

## 2021-02-04 MED ORDER — INSULIN ASPART 100 UNIT/ML IJ SOLN
0.0000 [IU] | Freq: Three times a day (TID) | INTRAMUSCULAR | Status: DC
  Administered 2021-02-05 – 2021-02-07 (×4): 2 [IU] via SUBCUTANEOUS
  Filled 2021-02-04: qty 0.09

## 2021-02-04 MED ORDER — ONDANSETRON HCL 4 MG PO TABS
4.0000 mg | ORAL_TABLET | Freq: Four times a day (QID) | ORAL | Status: DC | PRN
  Administered 2021-02-06: 08:00:00 4 mg via ORAL
  Filled 2021-02-04: qty 1

## 2021-02-04 MED ORDER — PREDNISONE 5 MG PO TABS: 5.0000 mg | ORAL_TABLET | ORAL | Status: DC

## 2021-02-04 MED ORDER — ACETAMINOPHEN 325 MG PO TABS
650.0000 mg | ORAL_TABLET | Freq: Four times a day (QID) | ORAL | Status: DC | PRN
  Administered 2021-02-04 – 2021-02-07 (×4): 650 mg via ORAL
  Filled 2021-02-04 (×4): qty 2

## 2021-02-04 MED ORDER — PREDNISONE 1 MG PO TABS: 3.0000 mg | ORAL_TABLET | ORAL | Status: DC

## 2021-02-04 MED ORDER — SODIUM CHLORIDE 0.9 % IV SOLN
10.0000 mL/h | Freq: Once | INTRAVENOUS | Status: AC
  Administered 2021-02-04: 10 mL/h via INTRAVENOUS

## 2021-02-04 MED ORDER — PREDNISONE 5 MG PO TABS
8.0000 mg | ORAL_TABLET | Freq: Every day | ORAL | Status: DC
  Administered 2021-02-05 – 2021-02-07 (×3): 8 mg via ORAL
  Filled 2021-02-04 (×4): qty 3

## 2021-02-04 MED ORDER — POLYVINYL ALCOHOL 1.4 % OP SOLN
1.0000 [drp] | Freq: Two times a day (BID) | OPHTHALMIC | Status: DC
  Administered 2021-02-04 – 2021-02-07 (×5): 1 [drp] via OPHTHALMIC
  Filled 2021-02-04 (×3): qty 15

## 2021-02-04 MED ORDER — ATORVASTATIN CALCIUM 40 MG PO TABS
80.0000 mg | ORAL_TABLET | Freq: Every day | ORAL | Status: DC
  Administered 2021-02-04 – 2021-02-07 (×4): 80 mg via ORAL
  Filled 2021-02-04 (×4): qty 2

## 2021-02-04 MED ORDER — ARFORMOTEROL TARTRATE 15 MCG/2ML IN NEBU
15.0000 ug | INHALATION_SOLUTION | Freq: Two times a day (BID) | RESPIRATORY_TRACT | Status: DC
  Administered 2021-02-04 – 2021-02-07 (×6): 15 ug via RESPIRATORY_TRACT
  Filled 2021-02-04 (×6): qty 2

## 2021-02-04 MED ORDER — FOLIC ACID 1 MG PO TABS
1.0000 mg | ORAL_TABLET | Freq: Every day | ORAL | Status: DC
  Administered 2021-02-05 – 2021-02-07 (×3): 1 mg via ORAL
  Filled 2021-02-04 (×3): qty 1

## 2021-02-04 MED ORDER — TRAMADOL HCL 50 MG PO TABS
50.0000 mg | ORAL_TABLET | Freq: Two times a day (BID) | ORAL | Status: DC | PRN
  Administered 2021-02-04 – 2021-02-06 (×3): 50 mg via ORAL
  Filled 2021-02-04 (×3): qty 1

## 2021-02-04 MED ORDER — LORATADINE 10 MG PO TABS
10.0000 mg | ORAL_TABLET | Freq: Every day | ORAL | Status: DC
  Administered 2021-02-04 – 2021-02-07 (×4): 10 mg via ORAL
  Filled 2021-02-04 (×4): qty 1

## 2021-02-04 MED ORDER — GLIPIZIDE ER 5 MG PO TB24
5.0000 mg | ORAL_TABLET | Freq: Every day | ORAL | Status: DC
  Filled 2021-02-04 (×2): qty 1

## 2021-02-04 MED ORDER — ONDANSETRON HCL 4 MG/2ML IJ SOLN: 4.0000 mg | Freq: Four times a day (QID) | INTRAMUSCULAR | Status: DC | PRN

## 2021-02-04 MED ORDER — ALBUTEROL SULFATE (2.5 MG/3ML) 0.083% IN NEBU: 2.5000 mg | INHALATION_SOLUTION | Freq: Four times a day (QID) | RESPIRATORY_TRACT | Status: DC | PRN

## 2021-02-04 MED ORDER — UMECLIDINIUM BROMIDE 62.5 MCG/ACT IN AEPB
1.0000 | INHALATION_SPRAY | Freq: Every day | RESPIRATORY_TRACT | Status: DC
  Administered 2021-02-05 – 2021-02-07 (×3): 1 via RESPIRATORY_TRACT
  Filled 2021-02-04 (×2): qty 7

## 2021-02-04 MED ORDER — FUROSEMIDE 40 MG PO TABS
40.0000 mg | ORAL_TABLET | Freq: Every day | ORAL | Status: DC
  Administered 2021-02-05 – 2021-02-07 (×3): 40 mg via ORAL
  Filled 2021-02-04 (×3): qty 1

## 2021-02-04 MED ORDER — PANTOPRAZOLE SODIUM 40 MG IV SOLR
40.0000 mg | Freq: Two times a day (BID) | INTRAVENOUS | Status: DC
  Administered 2021-02-04 – 2021-02-05 (×3): 40 mg via INTRAVENOUS
  Filled 2021-02-04 (×3): qty 40

## 2021-02-04 MED ORDER — ALLOPURINOL 100 MG PO TABS
100.0000 mg | ORAL_TABLET | Freq: Every day | ORAL | Status: DC
  Administered 2021-02-04 – 2021-02-07 (×4): 100 mg via ORAL
  Filled 2021-02-04 (×5): qty 1

## 2021-02-04 MED ORDER — MONTELUKAST SODIUM 10 MG PO TABS
10.0000 mg | ORAL_TABLET | Freq: Every evening | ORAL | Status: DC
  Administered 2021-02-04 – 2021-02-06 (×3): 10 mg via ORAL
  Filled 2021-02-04 (×3): qty 1

## 2021-02-04 MED ORDER — FAMOTIDINE 20 MG PO TABS
20.0000 mg | ORAL_TABLET | Freq: Every day | ORAL | Status: DC
  Administered 2021-02-04 – 2021-02-06 (×3): 20 mg via ORAL
  Filled 2021-02-04 (×3): qty 1

## 2021-02-04 MED ORDER — ACETAMINOPHEN 650 MG RE SUPP: 650.0000 mg | Freq: Four times a day (QID) | RECTAL | Status: DC | PRN

## 2021-02-04 MED ORDER — EMPAGLIFLOZIN 10 MG PO TABS
10.0000 mg | ORAL_TABLET | Freq: Every day | ORAL | Status: DC
  Administered 2021-02-05: 11:00:00 10 mg via ORAL
  Filled 2021-02-04 (×2): qty 1

## 2021-02-04 MED ORDER — MAGNESIUM OXIDE -MG SUPPLEMENT 400 (240 MG) MG PO TABS
400.0000 mg | ORAL_TABLET | Freq: Every evening | ORAL | Status: DC
  Administered 2021-02-04 – 2021-02-06 (×3): 400 mg via ORAL
  Filled 2021-02-04 (×3): qty 1

## 2021-02-04 NOTE — ED Triage Notes (Signed)
Patient reports she had blood work at [facility] office yesterday, pcp office called today and told patient to come to ED due to hemoglobin being 6.5.

## 2021-02-04 NOTE — H&P (View-Only) (Signed)
[facility] Consult: 3:15 PM 02/04/2021  LOS: 0 days    Referring Provider: Dr. Olevia Bowens Primary Care Physician:  [name], MD Primary Gastroenterologist: Carlean Purl MD    Reason for Consultation: Anemia   HPI: Ebony Scott is a 70 y.o. female.  PMH rheumatoid arthritis on chronic prednisone 10 mg daily with resulting acquired Cushing's syndrome.  Not respond to or developed tolerance to numerous DMARDs.  She is also being treated with IVIG treatments by Dr. Neldon Mc.  DVT.  Graves' disease.  C. difficile infection in 2019.  SIRS 2019 tracheobronchomalacia.  Ischemic cardiomyopathy.  LVEF 35 to 40%, grade 2 DD by echo 08/2020.  Surgeries include but not limited to abdominal hysterectomy, appendectomy, lumpectomy, cholecystectomy.   04/2014 EGD.  For evaluation nausea.  Normal study of esophagus, stomach, examined duodenum. 03/2016 colonoscopy, screening study.  3 diminutive polyps were removed A renal stone CT in August 2022 showed scattered colon diverticuli.  Cardiac cath with drug-eluting stent placement to LAD.  01/11/2021.  Diagnosed with ischemic congestive cardiomyopathy, combined systolic/diastolic CHF.  Plavix and low-dose aspirin added to chronic Xarelto.   GI meds include Pepcid at night, Dexilant in the a.m.  Complains of progressive weakness, shortness of breath with exertion.  Advised to present to the ED. Hgb 6.3, MCV 107.  This compares with 9 80-months ago, 11 34-months ago, 9.9 49-month ago, 8.3/104 12 days ago,  BUN/creatinine 38/1.3.  These were 42/1.4 2 weeks ago. Patient had started taking OTC iron about a month ago and her stools got dark as a result.  She stopped taking this within the past several days and stools are little bit less dark.  She is compliant with daily Dexilant.  Does not use  NSAIDs.  Denies nausea and vomiting.  Appetite labile but overall reduced.  No previous blood transfusions.  Patient does not drink or smoke. A paternal uncle had colon cancer.    Past Medical History:  Diagnosis Date   Abscess of dorsum of right hand 09/22/2020   Acute deep vein thrombosis (DVT) of right lower extremity ([place]) 12/13/2017   Allergic rhinitis    Anemia    Angio-edema    Anxiety    pt denies   Arthritis    Phreesia 10/21/2019   Asthma    Phreesia 10/21/2019   Breast cancer ([place]) 1998   in remission   Cataract    REMOVED   Clostridium difficile colitis 01/14/2018   Clostridium difficile diarrhea 98/92/1194   Complication of anesthesia    "had hard time waking up from it several times" (02/20/2012)   Depression    "some; don't take anything for it" (02/20/2012)   Diverticulosis    DVT (deep venous thrombosis) ([place])    Fibromyalgia 11/2011   GERD (gastroesophageal reflux disease)    Graves disease    Headache(784.0)    "related to allergies; more at different times during the year" (02/20/2012)   Hemorrhoids    Hiatal hernia    back and neck   Hx of adenomatous colonic polyps 04/12/2016  Hypercholesteremia    good cholesterol is high   Hypothyroidism    IBS (irritable bowel syndrome)    Moderate persistent asthma    -FeV1 72% 2011, -IgE 102 2011, CT sinus Neg 2011   Osteoporosis    on reclast yearly   Peripheral vascular disease ([place]) 2019   DVTs   Pneumonia 04/2011; ~ 11/2011   "double; single" (02/20/2012)   Septic olecranon bursitis of right elbow 09/22/2020   Seronegative rheumatoid arthritis ([place])    Dr. Lahoma Rocker   SIRS (systemic inflammatory response syndrome) ([place]) 02/10/2018   Tracheobronchomalacia     Past Surgical History:  Procedure Laterality Date   ABDOMINAL HYSTERECTOMY N/A    Phreesia 10/21/2019   ANTERIOR AND POSTERIOR REPAIR  1990's   APPENDECTOMY     BREAST LUMPECTOMY  1998   left   BREAST SURGERY N/A    Phreesia  10/21/2019   BRONCHIAL WASHINGS  04/05/2020   Procedure: BRONCHIAL WASHINGS;  Surgeon: Freddi Starr, MD;  Location: [facility];  Service: Pulmonary;;   CARPOMETACARPEL ([place]) FUSION OF THUMB WITH AUTOGRAFT FROM RADIUS  ~ 2009   "both thumbs" (02/20/2012)   CATARACT EXTRACTION W/ INTRAOCULAR LENS  IMPLANT, BILATERAL  2012   CERVICAL DISCECTOMY  10/2001   C5-C6   CERVICAL FUSION  2003   C3-C4   CHOLECYSTECTOMY     COLONOSCOPY     CORONARY STENT INTERVENTION N/A 01/11/2021   Procedure: CORONARY STENT INTERVENTION;  Surgeon: Leonie Man, MD;  Location: [facility];  Service: Cardiovascular;  Laterality: N/A;   DEBRIDEMENT TENNIS ELBOW  ?1970's   right   ESOPHAGOGASTRODUODENOSCOPY     EYE SURGERY N/A    Phreesia 10/21/2019   HYSTERECTOMY     I & D EXTREMITY Right 09/22/2020   Procedure: IRRIGATION AND DEBRIDEMENT RIGHT HAND AND ELBOW;  Surgeon: Marchia Bond, MD;  Location: [facility];  Service: Orthopedics;  Laterality: Right;   KNEE ARTHROPLASTY  ?1990's   "?right; w/cartilage repair" (02/20/2012)   NASAL SEPTUM SURGERY  1980's   POSTERIOR CERVICAL FUSION/FORAMINOTOMY  2004   "failed initial fusion; rewired  anterior neck" (02/20/2012)   REVERSE SHOULDER ARTHROPLASTY Right 02/16/2020   Procedure: REVERSE SHOULDER ARTHROPLASTY;  Surgeon: Marchia Bond, MD;  Location: [facility];  Service: Orthopedics;  Laterality: Right;   RIGHT/LEFT HEART CATH AND CORONARY ANGIOGRAPHY N/A 01/11/2021   Procedure: RIGHT/LEFT HEART CATH AND CORONARY ANGIOGRAPHY;  Surgeon: Leonie Man, MD;  Location: [facility];  Service: Cardiovascular;  Laterality: N/A;   SPINE SURGERY N/A    Phreesia 10/21/2019   TONSILLECTOMY  ~ 1953   VESICOVAGINAL FISTULA CLOSURE W/ TAH  1988   VIDEO BRONCHOSCOPY Bilateral 08/23/2016   Procedure: VIDEO BRONCHOSCOPY WITH FLUORO;  Surgeon: Javier Glazier, MD;  [facility]: [facility];  Service: Cardiopulmonary;  Laterality: Bilateral;   VIDEO BRONCHOSCOPY N/A  04/05/2020   Procedure: VIDEO BRONCHOSCOPY WITHOUT FLUORO;  Surgeon: Freddi Starr, MD;  [facility]: [facility];  Service: Pulmonary;  Laterality: N/A;    Prior to Admission medications   Medication Sig Start Date End Date Taking? Authorizing Provider  acetaminophen (TYLENOL) 650 MG CR tablet Take 1,300 mg by mouth every 8 (eight) hours as needed for pain.   Yes [provider]  albuterol (PROVENTIL) (2.5 MG/3ML) 0.083% nebulizer solution Take 3 mLs (2.5 mg total) by nebulization every 4 (four) hours as needed for wheezing or shortness of breath. 10/11/20  Yes Darrick Meigs, Marge Duncans, MD  albuterol (VENTOLIN HFA) 108 (  90 Base) MCG/ACT inhaler Inhale 2 puffs into the lungs every 6 (six) hours as needed for wheezing or shortness of breath.   Yes [provider]  allopurinol (ZYLOPRIM) 100 MG tablet Take 100 mg by mouth daily. 11/19/19  Yes [provider]  Alpha-D-Galactosidase (BEANO PO) Take 2 tablets by mouth 3 (three) times daily with meals.   Yes [provider]  Alpha-Lipoic Acid 600 MG TABS Take 600 mg by mouth daily.   Yes [provider]  aspirin EC 81 MG tablet Take 81 mg by mouth daily. Swallow whole.   Yes [provider]  atorvastatin (LIPITOR) 80 MG tablet TAKE 1 TABLET BY MOUTH DAILY 12/19/20  Yes [name], MD  azelastine (ASTELIN) 0.1 % nasal spray Use in each nostril as directed Patient taking differently: 2 sprays daily as needed for allergies. 11/01/20  Yes Kozlow, Donnamarie Poag, MD  CALCIUM-MAGNESIUM PO Take 1 tablet by mouth every evening.   Yes [provider]  cetirizine (ZYRTEC) 10 MG tablet Take 1 tablet (10 mg total) by mouth daily. Patient taking differently: Take 10 mg by mouth at bedtime. 11/01/20  Yes Kozlow, Donnamarie Poag, MD  cholecalciferol (VITAMIN D) 25 MCG (1000 UNIT) tablet 1,000 Units every evening.   Yes [provider]  clopidogrel (PLAVIX) 75 MG tablet Take 1 tablet (75 mg total) by mouth daily.  01/11/21 01/11/22 Yes Bhagat, Bhavinkumar, PA  DEXILANT 60 MG capsule TAKE 1 CAPSULE BY MOUTH EACH MORNING 02/02/20  Yes [name], MD  diclofenac Sodium (VOLTAREN) 1 % GEL Apply 1 application topically 4 (four) times daily as needed (pain).   Yes [provider]  empagliflozin (JARDIANCE) 10 MG TABS tablet Take 1 tablet (10 mg total) by mouth daily before breakfast. 12/05/20  Yes [name], MD  EPINEPHrine 0.3 mg/0.3 mL IJ SOAJ injection USE AS DIRECTED BY YOUR PHYSICIAN INTRAMUSCULARLY AS NEEDED FOR ANAPHYLAXIS 12/05/20  Yes Kozlow, Donnamarie Poag, MD  famotidine (PEPCID) 20 MG tablet Take 1 tablet (20 mg total) by mouth daily. Patient taking differently: Take 20 mg by mouth at bedtime. 12/27/20  Yes Kozlow, Donnamarie Poag, MD  fluticasone (FLONASE) 50 MCG/ACT nasal spray USE 2 SPRAYS IN EACH NOSTRIL DAILY Patient taking differently: 2 sprays daily as needed for allergies. 09/02/20  Yes Freddi Starr, MD  folic acid (FOLVITE) 1 MG tablet Take 1 mg by mouth daily.   Yes [provider]  furosemide (LASIX) 40 MG tablet Take 1 tablet (40 mg total) by mouth daily. Take 1 table twice a day for 3 days and then back to once daily. Patient taking differently: Take 40 mg by mouth daily. 01/19/21  Yes Loel Dubonnet, NP  glipiZIDE (GLUCOTROL XL) 5 MG 24 hr tablet Take 5 mg by mouth daily with breakfast.   Yes [provider]  guaiFENesin (MUCINEX) 600 MG 12 hr tablet Take 1 tablet (600 mg total) by mouth 2 (two) times daily as needed for cough or to loosen phlegm. Patient taking differently: Take 600 mg by mouth 2 (two) times daily. 05/09/20  Yes PickardCammie Mcgee, MD  HIZENTRA 2 GM/10ML SOLN Inject 1 Dose into the skin once a week. 01/04/21  Yes [provider]  Lactase (LACTOSE FAST ACTING RELIEF PO) Take 3 tablets by mouth in the morning, at noon, and at bedtime.   Yes [provider]  magnesium oxide (MAG-OX) 400 MG tablet Take 400 mg by mouth every  evening.   Yes [provider]  montelukast (SINGULAIR) 10 MG tablet Take 1 tablet (10 mg total) by mouth daily. Patient taking differently: Take 10 mg by mouth every evening. 11/01/20  Yes Kozlow, Donnamarie Poag, MD  Nystatin (GERHARDT'S BUTT CREAM) CREA Apply topically 3 times a day as needed for itching or irritation 12/30/20  Yes [name], MD  nystatin (MYCOSTATIN) 100000 UNIT/ML suspension Use as directed 15 mLs in the mouth or throat at bedtime as needed (mouth sores).   Yes [provider]  nystatin ointment (MYCOSTATIN) Apply 1 application topically 2 (two) times daily as needed (yeast).   Yes [provider]  predniSONE (DELTASONE) 1 MG tablet Take 3 mg by mouth as directed. Take along with 5 mg tablet=8 mg   Yes [provider]  predniSONE (DELTASONE) 5 MG tablet Take 5 mg by mouth as directed. Take along with 3 tablets of (1 mg)=8 mg   Yes [provider]  Prenatal Vit-Fe Fumarate-FA (PRENATAL VITAMINS PO) Take 1 tablet by mouth daily.   Yes [provider]  PRESCRIPTION MEDICATION Place 4 drops into both ears See admin instructions. Chloramp- sulfu - ampho d-h droc 100-100-5 hydrocortisone, otic ear drops. Instill 4 drops in to each ear daily for the first 5 days of each month   Yes [provider]  Probiotic Product (ALIGN PO) Take 1 capsule by mouth daily.   Yes [provider]  Propylene Glycol (SYSTANE COMPLETE) 0.6 % SOLN Place 1 drop into both eyes 2 (two) times daily.   Yes [provider]  Tiotropium Bromide-Olodaterol (STIOLTO RESPIMAT) 2.5-2.5 MCG/ACT AERS Inhale 2 puffs into the lungs daily. 08/05/20  Yes Freddi Starr, MD  traMADol (ULTRAM) 50 MG tablet Take 1 tablet (50 mg total) by mouth at bedtime as needed. Patient taking differently: Take 50 mg by mouth at bedtime. 12/05/20  Yes PickardCammie Mcgee, MD  XARELTO 20 MG TABS tablet TAKE 1 TABLET BY MOUTH DAILY WITH SUPPER 02/02/20  Yes [name], MD  benzonatate (TESSALON) 200 MG capsule Take 1 capsule (200 mg total) by mouth 3 (three) times daily as needed for cough. Patient not taking: Reported on 02/04/2021 11/03/20   [name], MD  budesonide (PULMICORT) 0.5 MG/2ML nebulizer solution Take 2 mLs (0.5 mg total) by nebulization 2 (two) times daily as needed. 11/15/20   Freddi Starr, MD  Glucose Blood (BLOOD GLUCOSE TEST STRIPS) STRP Please dispense per patient and insurance preference. Use as directed to monitor FSBS 4x . Dx: E11.65 09/08/20   [name], MD  predniSONE (DELTASONE) 10 MG tablet Take 1 tablet (10 mg total) by mouth daily with breakfast. Take with additional 2 tabs of 1mg  Prednisone to equal 12mg  daily. Patient not taking: Reported on 02/04/2021 11/09/20   [name], MD  potassium chloride SA (KLOR-CON) 20 MEQ tablet Take 1 tablet (20 mEq total) by mouth daily. Patient not taking: No sig reported 08/30/20 09/23/20  [name], MD    Scheduled Meds:  denosumab  60 mg Subcutaneous Q6 months   pantoprazole (PROTONIX) IV  40 mg Intravenous Q12H   Infusions:  PRN Meds: acetaminophen **OR** acetaminophen, ondansetron **OR** ondansetron (ZOFRAN) IV   Allergies as of 02/04/2021 - Review Complete 02/04/2021  Allergen Reaction Noted   Baclofen Other (See Comments) 02/27/2020   Dust mite extract Shortness Of Breath and Other (See Comments) 07/05/2010   Molds & smuts Shortness Of Breath    Morphine and related Hives and  Itching 10/21/2019   Other Shortness Of Breath and Other (See Comments) 11/28/2011   Penicillins Rash and Other (See Comments)    Rofecoxib Swelling and Other (See Comments) 08/08/2020   Shellfish allergy Anaphylaxis, Shortness Of Breath, Itching, and Rash 10/21/2019   Tetracycline hcl Nausea And Vomiting    Xolair [omalizumab] Other (See Comments) 08/16/2016   Zoledronic acid Other (See Comments) 04/01/2012   Dilaudid [hydromorphone hcl] Itching 02/21/2012    Levofloxacin Other (See Comments) 08/08/2020   Oxycodone hcl Nausea And Vomiting    Paroxetine Nausea And Vomiting and Other (See Comments)    Celecoxib Swelling and Other (See Comments) 05/03/2015   Diltiazem hcl Swelling 08/08/2020   Hydrocodone-acetaminophen Other (See Comments) 08/08/2020   Lactose intolerance (gi) Other (See Comments) 12/14/2017   Morphine sulfate Other (See Comments) 08/08/2020   Oxycodone-acetaminophen Other (See Comments) 08/08/2020   Rituximab  01/06/2021   Tree extract Other (See Comments) 09/20/2010   Penicillin g procaine Rash and Other (See Comments) 08/08/2020    Family History  Problem Relation Age of Onset   Allergies Mother    Heart disease Mother    Arthritis Mother    Lung cancer Mother    Diabetes Mother    Allergies Father    Heart disease Father    Arthritis Father    Stroke Father    Diabetes Maternal Grandfather    Colitis Daughter    Colon cancer Other        Maternal half aunt/Maternal half uncle    Social History   Socioeconomic History   Marital status: Divorced    Spouse name: Not on file   Number of children: 2   Years of education: college   Highest education level: Not on file  Occupational History   Occupation: Disabled    Comment: Retired Engineer, production: RETIRED  Tobacco Use   Smoking status: Never    Passive exposure: Yes   Smokeless tobacco: Never   Tobacco comments:    Parents  Vaping Use   Vaping Use: Never used  Substance and Sexual Activity   Alcohol use: Not Currently    Alcohol/week: 0.0 standard drinks   Drug use: No   Sexual activity: Never  Other Topics Concern   Not on file  Social History Narrative   Patient lives at home alone. Patient  divorced.    Patient has her BS degree.   Right handed.   Caffeine- sometimes coffee.      [facility]:   Born in [place], [place]. She worked as a Copywriter, advertising. She has no pets currently. She does have indoor plants.  Previously had mold in her home that was remediated. Carpet was removed.          Social Determinants of Health   Financial Resource Strain: Low Risk    Difficulty of Paying Living Expenses: Not hard at all  Food Insecurity: No Food Insecurity   Worried About Charity fundraiser in the Last Year: Never true   [place] in the Last Year: Never true  Transportation Needs: No Transportation Needs   Lack of Transportation (Medical): No   Lack of Transportation (Non-Medical): No  Physical Activity: Inactive   Days of Exercise per Week: 0 days   Minutes of Exercise per Session: 0 min  Stress: No Stress Concern Present   Feeling of Stress : Not at all  Social Connections: Moderately Isolated   Frequency of Communication with Friends  and Family: More than three times a week   Frequency of Social Gatherings with Friends and Family: More than three times a week   Attends Religious Services: Never   Marine scientist or Organizations: Yes   Attends Archivist Meetings: Never   Marital Status: Divorced  Human resources officer Violence: Not At Risk   Fear of Current or Ex-Partner: No   Emotionally Abused: No   Physically Abused: No   Sexually Abused: No    REVIEW OF SYSTEMS: Constitutional: Weakness, fatigue ENT:  No nose bleeds Pulm: Shortness of breath with exertion.  No cough. CV:  No palpitations, no LE edema.  No angina GU:  No hematuria, no frequency GI: No dysphagia.  See HPI. Heme: Occasionally see some blood when she blows her nose but no aggressive epistaxis. Transfusions: None Neuro:  No headaches, no peripheral tingling or numbness no syncope, no seizures Derm:  No itching, no rash or sores.  Endocrine:  No sweats or chills.  No polyuria or dysuria Immunization: Vaccination history reviewed. Travel:  None beyond local counties in last few months.    PHYSICAL EXAM: Vital signs in last 24 hours: Vitals:   02/04/21 1300 02/04/21 1430  BP: (!) 118/47 (!)  114/56  Pulse: 71 67  Resp: 17 11  Temp:    SpO2: 97% 99%   Wt Readings from Last 3 Encounters:  02/04/21 58.1 kg  01/23/21 58.1 kg  01/19/21 59 kg    General: Patient is a chronically ill cushingoid looking woman.  She looks younger than stated age but looks unwell.  In no distress resting comfortably on stretcher Head: No facial asymmetry.  Facies cushingoid.  No signs of head trauma. Eyes: Conjunctival pallor.  EOMI.  Slight exophthalmos, arcus senilis. Ears: Not hard of hearing Nose: No congestion or discharge Mouth: Fair dentition.  Tongue midline.  Mucosa moist, pink, clear. Neck: No JVD, no masses, no thyromegaly Lungs: Clear bilaterally.  No labored breathing or cough Heart: RRR.  No MRG.  S1, S2 present Abdomen: Soft without tenderness.  No HSM, masses, bruits, hernias.  Obese without protuberance..   Rectal: Deferred.  Patient said she had DRE but I do not see any documentation of this or FOBT results. Musc/Skeltl: No joint redness.  Boyant, soft mass at L metatarsal.   Extremities: No CCE. Neurologic: Alert.  Appropriate.  Oriented x3.  Moves all 4 limbs without tremor, strength not tested. Skin: Pale.  No telangiectasia.  No significant bruising. Nodes: No cervical adenopathy Psych: Cooperative, calm, pleasant.  Intake/Output from previous day: No intake/output data recorded. Intake/Output this shift: No intake/output data recorded.  LAB RESULTS: Recent Labs    02/03/21 0817 02/04/21 1150  WBC 11.2* 18.0*  HGB 6.4* 6.3*  HCT 20.6* 21.4*  PLT 306 317   BMET Lab Results  Component Value Date   NA 137 02/04/2021   NA 141 02/03/2021   NA 142 01/23/2021   K 3.7 02/04/2021   K 4.4 02/03/2021   K 4.9 01/23/2021   CL 102 02/04/2021   CL 102 02/03/2021   CL 103 01/23/2021   CO2 26 02/04/2021   CO2 20 02/03/2021   CO2 28 01/23/2021   GLUCOSE 164 (H) 02/04/2021   GLUCOSE 123 (H) 02/03/2021   GLUCOSE 102 (H) 01/23/2021   BUN 38 (H) 02/04/2021   BUN 34  (H) 02/03/2021   BUN 42 (H) 01/23/2021   CREATININE 1.35 (H) 02/04/2021   CREATININE 1.14 (H) 02/03/2021  CREATININE 1.44 (H) 01/23/2021   CALCIUM 8.5 (L) 02/04/2021   CALCIUM 8.7 02/03/2021   CALCIUM 9.3 01/23/2021   LFT Recent Labs    02/04/21 1150  PROT 6.5  ALBUMIN 3.2*  AST 30  ALT 33  ALKPHOS 66  BILITOT 0.6   PT/INR Lab Results  Component Value Date   INR 1.1 02/04/2021   INR 1.6 (H) 10/04/2020   INR 1.0 09/22/2020   Hepatitis Panel No results for input(s): HEPBSAG, HCVAB, HEPAIGM, HEPBIGM in the last 72 hours. C-Diff No components found for: CDIFF Lipase  No results found for: LIPASE  Drugs of Abuse     Component Value Date/Time   LABOPIA NONE DETECTED 02/23/2020 1300   COCAINSCRNUR NONE DETECTED 02/23/2020 1300   COCAINSCRNUR NEG 05/31/2014 1353   LABBENZ NONE DETECTED 02/23/2020 1300   AMPHETMU NONE DETECTED 02/23/2020 1300   THCU NONE DETECTED 02/23/2020 1300   LABBARB NONE DETECTED 02/23/2020 1300     RADIOLOGY STUDIES: DG Chest Port 1 View  Result Date: 02/04/2021 CLINICAL DATA:  Per chart: Patient reports she had blood work at [facility] [employer] office yesterday, pcp office called today and told patient to come to ED due to hemoglobin being 6.5. Hx of asthma, DVT EXAM: PORTABLE CHEST 1 VIEW COMPARISON:  10/06/2020 and older studies. FINDINGS: Cardiac silhouette mildly enlarged.  No mediastinal or hilar masses. Clear lungs.  No convincing pleural effusion.  No pneumothorax. Stable right reverse shoulder arthroplasty. IMPRESSION: No acute cardiopulmonary disease. Electronically Signed   By: Lajean Manes M.D.   On: 02/04/2021 13:39      IMPRESSION:   Macrocytic anemia.  About to receive 1 PRBC.  Anemia has been progressive over 4 months.  Takes chronic Dexilant.  Currently being covered with Protonix 40 mg iv bid.    Cardiac stenting last month.  Low-dose aspirin and Plavix added to chronic Xarelto chronically in use for history DVT.  All 3 of these are now on  hold.  Last Plavix was 12/17 and plan was to discontinue this after 12/21.     Seronegative rheumatoid arthritis.  Failed to respond to or lost response to multiple DMARDs.  Now on Prednisone 10 mg daily.      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  CKD.  Her current BUN and creatinine, GFR are improved compared with 2 weeks prior.    PLAN:        EGD, colonoscopy?     getting unit of blood now.  CBC will be repeated tomorrow morning, H&H after completion of PRBC.  As this does not appear to be an aggressive upper GI bleed I am going to advance her to a full liquid diet   Azucena Freed  02/04/2021, 3:15 PM Phone [phone]

## 2021-02-04 NOTE — ED Notes (Addendum)
Patient given broth. 

## 2021-02-04 NOTE — H&P (Signed)
History and Physical    Ebony Scott FXT:[id number] DOB: [birth year] DOA: 02/04/2021  PCP: [name], MD   Patient coming from: Home.  I have personally briefly reviewed patient's old medical records in [place]  Chief Complaint: Weakness.  HPI: Ebony Scott is a 70 y.o. female with medical history significant of right hand abscess, history of DVT and PE on Xarelto, allergic rhinitis, chronic blood loss anemia, angioedema, anxiety/depression, osteoarthritis, mild intermittent asthma, cataracts, C. difficile colitis, diverticulosis, GERD, hiatal hernia, hemorrhoids, IBS, colon polyps, fibromyalgia, headaches, hyperlipidemia, hypothyroid, seronegative rheumatoid arthritis, PVD, CAD with recent drug-eluting stent placement on LAD about 3 weeks ago with addition of aspirin and clopidogrel to her medication regimen who is coming to the emergency department with complaints of progressively worse fatigue and somnolence in the last few days setting of worsening chronic anemia.  She went to see her PCP yesterday who asked the patient to come to the emergency department due to a hemoglobin of 6.5 g/dL.  He denied abdominal pain, nausea, emesis, diarrhea, constipation, hematochezia, had some melena but this was due to taking OTC iron supplements.  She stopped earlier this month and has not had any melena since.  No fever, chills, sore throat rhinorrhea.  She gets occasional wheezing.  She had productive cough usually in the mornings.  No chest pain, palpitations, diaphoresis, dizziness, PND, orthopnea or recent pitting edema of the lower extremities.  No flank pain, dysuria, frequency or hematuria.  No polyuria, polydipsia, polyphagia or blurred vision.  ED Course: Initial vital signs were temperature 98.2 F, pulse 85, respirations 16, BP 111/43 mmHg O2 sat 98% on room air.  The patient received 1 unit of PRBC.  Lab work: Her CBC is her white count 18.0, hemoglobin 6.3 g/dL and platelet 317.   CMP showed normal electrolytes when calcium corrected to albumin.  Glucose 164, BUN 38 and creatinine 1.35 mg/dL.  LFTs were normal except for an albumin of 3.2 g/dL.  Imaging: One-view portable chest radiograph did not show any acute cardiopulmonary disease.  She had mild enlargement of the cardiac silhouette.  Please see image and full radiology report for further details.  Review of Systems: As per HPI otherwise all other systems reviewed and are negative.  Past Medical History:  Diagnosis Date   Abscess of dorsum of right hand 09/22/2020   Acute deep vein thrombosis (DVT) of right lower extremity ([place]) 12/13/2017   Allergic rhinitis    Anemia    Angio-edema    Anxiety    pt denies   Arthritis    Phreesia 10/21/2019   Asthma    Phreesia 10/21/2019   Breast cancer ([place]) 1998   in remission   Cataract    REMOVED   Clostridium difficile colitis 01/14/2018   Clostridium difficile diarrhea 29/92/4268   Complication of anesthesia    "had hard time waking up from it several times" (02/20/2012)   Depression    "some; don't take anything for it" (02/20/2012)   Diverticulosis    DVT (deep venous thrombosis) ([place])    Fibromyalgia 11/2011   GERD (gastroesophageal reflux disease)    Graves disease    Headache(784.0)    "related to allergies; more at different times during the year" (02/20/2012)   Hemorrhoids    Hiatal hernia    back and neck   Hx of adenomatous colonic polyps 04/12/2016   Hypercholesteremia    good cholesterol is high   Hypothyroidism    IBS (irritable bowel  syndrome)    Moderate persistent asthma    -FeV1 72% 2011, -IgE 102 2011, CT sinus Neg 2011   Osteoporosis    on reclast yearly   Peripheral vascular disease ([place]) 2019   DVTs   Pneumonia 04/2011; ~ 11/2011   "double; single" (02/20/2012)   Septic olecranon bursitis of right elbow 09/22/2020   Seronegative rheumatoid arthritis ([place])    Dr. Lahoma Rocker   SIRS (systemic inflammatory response syndrome) ([place])  02/10/2018   Tracheobronchomalacia     Past Surgical History:  Procedure Laterality Date   ABDOMINAL HYSTERECTOMY N/A    Phreesia 10/21/2019   ANTERIOR AND POSTERIOR REPAIR  1990's   APPENDECTOMY     BREAST LUMPECTOMY  1998   left   BREAST SURGERY N/A    Phreesia 10/21/2019   BRONCHIAL WASHINGS  04/05/2020   Procedure: BRONCHIAL WASHINGS;  Surgeon: Freddi Starr, MD;  Location: [facility];  Service: Pulmonary;;   CARPOMETACARPEL ([place]) FUSION OF THUMB WITH AUTOGRAFT FROM RADIUS  ~ 2009   "both thumbs" (02/20/2012)   CATARACT EXTRACTION W/ INTRAOCULAR LENS  IMPLANT, BILATERAL  2012   CERVICAL DISCECTOMY  10/2001   C5-C6   CERVICAL FUSION  2003   C3-C4   CHOLECYSTECTOMY     COLONOSCOPY     CORONARY STENT INTERVENTION N/A 01/11/2021   Procedure: CORONARY STENT INTERVENTION;  Surgeon: Leonie Man, MD;  Location: [facility];  Service: Cardiovascular;  Laterality: N/A;   DEBRIDEMENT TENNIS ELBOW  ?1970's   right   ESOPHAGOGASTRODUODENOSCOPY     EYE SURGERY N/A    Phreesia 10/21/2019   HYSTERECTOMY     I & D EXTREMITY Right 09/22/2020   Procedure: IRRIGATION AND DEBRIDEMENT RIGHT HAND AND ELBOW;  Surgeon: Marchia Bond, MD;  [facility]: [facility];  Service: Orthopedics;  Laterality: Right;   KNEE ARTHROPLASTY  ?1990's   "?right; w/cartilage repair" (02/20/2012)   NASAL SEPTUM SURGERY  1980's   POSTERIOR CERVICAL FUSION/FORAMINOTOMY  2004   "failed initial fusion; rewired  anterior neck" (02/20/2012)   REVERSE SHOULDER ARTHROPLASTY Right 02/16/2020   Procedure: REVERSE SHOULDER ARTHROPLASTY;  Surgeon: Marchia Bond, MD;  [facility];  Service: Orthopedics;  Laterality: Right;   RIGHT/LEFT HEART CATH AND CORONARY ANGIOGRAPHY N/A 01/11/2021   Procedure: RIGHT/LEFT HEART CATH AND CORONARY ANGIOGRAPHY;  Surgeon: Leonie Man, MD;  Location: [facility];  Service: Cardiovascular;  Laterality: N/A;   SPINE SURGERY N/A    Phreesia 10/21/2019   TONSILLECTOMY  ~  1953   VESICOVAGINAL FISTULA CLOSURE W/ TAH  1988   VIDEO BRONCHOSCOPY Bilateral 08/23/2016   Procedure: VIDEO BRONCHOSCOPY WITH FLUORO;  Surgeon: Javier Glazier, MD;  [facility];  Service: Cardiopulmonary;  Laterality: Bilateral;   VIDEO BRONCHOSCOPY N/A 04/05/2020   Procedure: VIDEO BRONCHOSCOPY WITHOUT FLUORO;  Surgeon: Freddi Starr, MD;  [facility];  Service: Pulmonary;  Laterality: N/A;   Social History  reports that she has never smoked. She has been exposed to tobacco smoke. She has never used smokeless tobacco. She reports that she does not currently use alcohol. She reports that she does not use drugs.  Allergies  Allergen Reactions   Baclofen Other (See Comments)    Altered mental status requiring a 3-day hospital stay- patient became unresponsive   Dust Mite Extract Shortness Of Breath and Other (See Comments)    "sneezing" (02/20/2012)   Molds & Smuts Shortness Of Breath   Morphine And Related Hives and Itching  Other Shortness Of Breath and Other (See Comments)    Grass and weeds "sneezing; filled sinuses" (02/20/2012)   Penicillins Rash and Other (See Comments)    "welts" (02/20/2012) Tolerated Ancef 02/16/20.    Rofecoxib Swelling and Other (See Comments)    Vioxx- feet swelling    Shellfish Allergy Anaphylaxis, Shortness Of Breath, Itching and Rash   Tetracycline Hcl Nausea And Vomiting   Xolair [Omalizumab] Other (See Comments)    Caused Blood clot   Zoledronic Acid Other (See Comments)    Fever, Put in hospital, dr said it was a reaction from a reaction  Other reaction(s): Unknown   Dilaudid [Hydromorphone Hcl] Itching   Levofloxacin Other (See Comments)    GI upset   Oxycodone Hcl Nausea And Vomiting   Paroxetine Nausea And Vomiting and Other (See Comments)    Paxil    Celecoxib Swelling and Other (See Comments)    Feet swelling   Diltiazem Hcl Swelling   Hydrocodone-Acetaminophen Other (See Comments)    Unknown reaction    Lactose Intolerance (Gi) Other (See Comments)    Bloating and gas   Morphine Sulfate Other (See Comments)    "Gets loopy"   Oxycodone-Acetaminophen Other (See Comments)    Unknown reaction   Rituximab     Anemia    Tree Extract Other (See Comments)    "tested and told I was allergic to it; never experienced a reaction to it" (02/20/2012)   Penicillin G Procaine Rash and Other (See Comments)    Welts, also   Family History  Problem Relation Age of Onset   Allergies Mother    Heart disease Mother    Arthritis Mother    Lung cancer Mother    Diabetes Mother    Allergies Father    Heart disease Father    Arthritis Father    Stroke Father    Diabetes Maternal Grandfather    Colitis Daughter    Colon cancer Other        Maternal half aunt/Maternal half uncle   Prior to Admission medications   Medication Sig Start Date End Date Taking? Authorizing Provider  acetaminophen (TYLENOL) 650 MG CR tablet Take 1,300 mg by mouth every 8 (eight) hours as needed for pain.   Yes [provider]  albuterol (PROVENTIL) (2.5 MG/3ML) 0.083% nebulizer solution Take 3 mLs (2.5 mg total) by nebulization every 4 (four) hours as needed for wheezing or shortness of breath. 10/11/20  Yes Darrick Meigs, Marge Duncans, MD  albuterol (VENTOLIN HFA) 108 (90 Base) MCG/ACT inhaler Inhale 2 puffs into the lungs every 6 (six) hours as needed for wheezing or shortness of breath.   Yes [provider]  allopurinol (ZYLOPRIM) 100 MG tablet Take 100 mg by mouth daily. 11/19/19  Yes [provider]  Alpha-D-Galactosidase (BEANO PO) Take 2 tablets by mouth 3 (three) times daily with meals.   Yes [provider]  Alpha-Lipoic Acid 600 MG TABS Take 600 mg by mouth daily.   Yes [provider]  aspirin EC 81 MG tablet Take 81 mg by mouth daily. Swallow whole.   Yes [provider]  atorvastatin (LIPITOR) 80 MG tablet TAKE 1 TABLET BY MOUTH DAILY 12/19/20  Yes [name], MD   azelastine (ASTELIN) 0.1 % nasal spray Use in each nostril as directed Patient taking differently: 2 sprays daily as needed for allergies. 11/01/20  Yes Kozlow, Donnamarie Poag, MD  CALCIUM-MAGNESIUM PO Take 1 tablet by mouth every evening.   Yes  [provider]  cetirizine (ZYRTEC) 10 MG tablet Take 1 tablet (10 mg total) by mouth daily. Patient taking differently: Take 10 mg by mouth at bedtime. 11/01/20  Yes Kozlow, Donnamarie Poag, MD  cholecalciferol (VITAMIN D) 25 MCG (1000 UNIT) tablet 1,000 Units every evening.   Yes [provider]  clopidogrel (PLAVIX) 75 MG tablet Take 1 tablet (75 mg total) by mouth daily. 01/11/21 01/11/22 Yes Bhagat, Bhavinkumar, PA  DEXILANT 60 MG capsule TAKE 1 CAPSULE BY MOUTH EACH MORNING 02/02/20  Yes [name], MD  diclofenac Sodium (VOLTAREN) 1 % GEL Apply 1 application topically 4 (four) times daily as needed (pain).   Yes [provider]  empagliflozin (JARDIANCE) 10 MG TABS tablet Take 1 tablet (10 mg total) by mouth daily before breakfast. 12/05/20  Yes [name], MD  EPINEPHrine 0.3 mg/0.3 mL IJ SOAJ injection USE AS DIRECTED BY YOUR PHYSICIAN INTRAMUSCULARLY AS NEEDED FOR ANAPHYLAXIS 12/05/20  Yes Kozlow, Donnamarie Poag, MD  famotidine (PEPCID) 20 MG tablet Take 1 tablet (20 mg total) by mouth daily. Patient taking differently: Take 20 mg by mouth at bedtime. 12/27/20  Yes Kozlow, Donnamarie Poag, MD  fluticasone (FLONASE) 50 MCG/ACT nasal spray USE 2 SPRAYS IN EACH NOSTRIL DAILY Patient taking differently: 2 sprays daily as needed for allergies. 09/02/20  Yes Freddi Starr, MD  folic acid (FOLVITE) 1 MG tablet Take 1 mg by mouth daily.   Yes [provider]  furosemide (LASIX) 40 MG tablet Take 1 tablet (40 mg total) by mouth daily. Take 1 table twice a day for 3 days and then back to once daily. Patient taking differently: Take 40 mg by mouth daily. 01/19/21  Yes Loel Dubonnet, NP  glipiZIDE (GLUCOTROL XL) 5 MG 24 hr tablet Take 5  mg by mouth daily with breakfast.   Yes [provider]  guaiFENesin (MUCINEX) 600 MG 12 hr tablet Take 1 tablet (600 mg total) by mouth 2 (two) times daily as needed for cough or to loosen phlegm. Patient taking differently: Take 600 mg by mouth 2 (two) times daily. 05/09/20  Yes PickardCammie Mcgee, MD  HIZENTRA 2 GM/10ML SOLN Inject 1 Dose into the skin once a week. 01/04/21  Yes [provider]  Lactase (LACTOSE FAST ACTING RELIEF PO) Take 3 tablets by mouth in the morning, at noon, and at bedtime.   Yes [provider]  magnesium oxide (MAG-OX) 400 MG tablet Take 400 mg by mouth every evening.   Yes [provider]  montelukast (SINGULAIR) 10 MG tablet Take 1 tablet (10 mg total) by mouth daily. Patient taking differently: Take 10 mg by mouth every evening. 11/01/20  Yes Kozlow, Donnamarie Poag, MD  Nystatin (GERHARDT'S BUTT CREAM) CREA Apply topically 3 times a day as needed for itching or irritation 12/30/20  Yes [name], MD  nystatin (MYCOSTATIN) 100000 UNIT/ML suspension Use as directed 15 mLs in the mouth or throat at bedtime as needed (mouth sores).   Yes [provider]  nystatin ointment (MYCOSTATIN) Apply 1 application topically 2 (two) times daily as needed (yeast).   Yes [provider]  predniSONE (DELTASONE) 1 MG tablet Take 3 mg by mouth as directed. Take along with 5 mg tablet=8 mg   Yes [provider]  predniSONE (DELTASONE) 5 MG tablet Take 5 mg by mouth as directed. Take along with 3 tablets of (1 mg)=8 mg   Yes [provider]  Prenatal Vit-Fe Fumarate-FA (PRENATAL VITAMINS  PO) Take 1 tablet by mouth daily.   Yes [provider]  PRESCRIPTION MEDICATION Place 4 drops into both ears See admin instructions. Chloramp- sulfu - ampho d-h droc 100-100-5 hydrocortisone, otic ear drops. Instill 4 drops in to each ear daily for the first 5 days of each month   Yes [provider]  Probiotic Product  (ALIGN PO) Take 1 capsule by mouth daily.   Yes [provider]  Propylene Glycol (SYSTANE COMPLETE) 0.6 % SOLN Place 1 drop into both eyes 2 (two) times daily.   Yes [provider]  Tiotropium Bromide-Olodaterol (STIOLTO RESPIMAT) 2.5-2.5 MCG/ACT AERS Inhale 2 puffs into the lungs daily. 08/05/20  Yes Freddi Starr, MD  traMADol (ULTRAM) 50 MG tablet Take 1 tablet (50 mg total) by mouth at bedtime as needed. Patient taking differently: Take 50 mg by mouth at bedtime. 12/05/20  Yes PickardCammie Mcgee, MD  XARELTO 20 MG TABS tablet TAKE 1 TABLET BY MOUTH DAILY WITH SUPPER 02/02/20  Yes [name], MD  benzonatate (TESSALON) 200 MG capsule Take 1 capsule (200 mg total) by mouth 3 (three) times daily as needed for cough. Patient not taking: Reported on 02/04/2021 11/03/20   [name], MD  budesonide (PULMICORT) 0.5 MG/2ML nebulizer solution Take 2 mLs (0.5 mg total) by nebulization 2 (two) times daily as needed. 11/15/20   Freddi Starr, MD  Glucose Blood (BLOOD GLUCOSE TEST STRIPS) STRP Please dispense per patient and insurance preference. Use as directed to monitor FSBS 4x . Dx: E11.65 09/08/20   [name], MD  predniSONE (DELTASONE) 10 MG tablet Take 1 tablet (10 mg total) by mouth daily with breakfast. Take with additional 2 tabs of 1mg  Prednisone to equal 12mg  daily. Patient not taking: Reported on 02/04/2021 11/09/20   [name], MD  potassium chloride SA (KLOR-CON) 20 MEQ tablet Take 1 tablet (20 mEq total) by mouth daily. Patient not taking: No sig reported 08/30/20 09/23/20  [name], MD    Physical Exam: Vitals:   02/04/21 1530 02/04/21 1539 02/04/21 1544 02/04/21 1549  BP: 121/68 (!) 112/48 (!) 116/55 (!) 119/54  Pulse: 75 80 74 78  Resp: (!) 25 18 14 14   Temp: 97.9 F (36.6 C) 98 F (36.7 C) 98.2 F (36.8 C) 98.2 F (36.8 C)  TempSrc: Oral Oral Oral Oral  SpO2:  96% 96% 96%  Weight:      Height:       Constitutional:  Chronically ill appearing and cushingoid facies.  NAD, calm, comfortable Eyes: PERRL, lids and conjunctivae showed pallor. ENMT: Mucous membranes are moist. Posterior pharynx clear of any exudate or lesions. Neck: normal, supple, no masses, no thyromegaly Respiratory: clear to auscultation bilaterally, no wheezing, no crackles. Normal respiratory effort. No accessory muscle use.  Cardiovascular: Regular rate and rhythm, no murmurs / rubs / gallops. No extremity edema. 2+ pedal pulses. No carotid bruits.  Abdomen: No distention.  Soft, no tenderness, no masses palpated. No hepatosplenomegaly. Bowel sounds positive.  Musculoskeletal: no clubbing / cyanosis. Good ROM, no contractures. Normal muscle tone.  Skin: Pale skin with multiple areas of ecchymosis on extremities. Neurologic: CN 2-12 grossly intact. Sensation intact, DTR normal. Strength 5/5 in all 4.  Psychiatric: Normal judgment and insight. Alert and oriented x 3. Normal mood.   Labs on Admission: I have personally reviewed following labs and imaging studies  CBC: Recent Labs  Lab 02/03/21 0817 02/04/21 1150  WBC 11.2* 18.0*  NEUTROABS  --  14.9*  HGB 6.4* 6.3*  HCT 20.6* 21.4*  MCV 102* 107.0*  PLT 306 401    Basic Metabolic Panel: Recent Labs  Lab 02/03/21 0817 02/04/21 1150  NA 141 137  K 4.4 3.7  CL 102 102  CO2 20 26  GLUCOSE 123* 164*  BUN 34* 38*  CREATININE 1.14* 1.35*  CALCIUM 8.7 8.5*    GFR: Estimated Creatinine Clearance: 30.9 mL/min (A) (by C-G formula based on SCr of 1.35 mg/dL (H)).  Liver Function Tests: Recent Labs  Lab 02/04/21 1150  AST 30  ALT 33  ALKPHOS 66  BILITOT 0.6  PROT 6.5  ALBUMIN 3.2*   Radiological Exams on Admission: DG Chest Port 1 View  Result Date: 02/04/2021 CLINICAL DATA:  Per chart: Patient reports she had blood work at [employer] office yesterday, pcp office called today and told patient to come to ED due to hemoglobin being 6.5. Hx of asthma, DVT EXAM: PORTABLE CHEST 1  VIEW COMPARISON:  10/06/2020 and older studies. FINDINGS: Cardiac silhouette mildly enlarged.  No mediastinal or hilar masses. Clear lungs.  No convincing pleural effusion.  No pneumothorax. Stable right reverse shoulder arthroplasty. IMPRESSION: No acute cardiopulmonary disease. Electronically Signed   By: Lajean Manes M.D.   On: 02/04/2021 13:39    EKG: Independently reviewed.  Vent. rate 76 BPM PR interval 136 ms QRS duration 83 ms QT/QTcB 390/439 ms P-R-T axes 55 88 247 Sinus rhythm Borderline right axis deviation Anteroseptal infarct, old Borderline repolarization abnormality  Assessment/Plan Principal Problem:   Acute on chronic blood loss anemia Does not seem to be actively bleeding. Observation/telemetry. Clear liquid diet. N.p.o. after midnight. Hold DAPT and DOAC. Pantoprazole 40 mg IVP every 12 hours. Monitor hematocrit and hemoglobin. Transfuse as needed. Gastroenterology consult appreciated.  Active Problems:   DMII (diabetes mellitus, type 2) ([place]) On clear liquid diet. Check hemoglobin A1c. CBG monitoring with RI SS.    Hyperlipidemia Continue atorvastatin 80 mg p.o. daily.    GERD On pantoprazole IV.    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 Continue furosemide 40 mg p.o. daily. May benefit from ACE inhibitor and beta-blocker therapy.    Chronic kidney disease, stage 3b (HCC) Stable. Monitor renal function electrolytes.    Chronic pain syndrome Continue tramadol as needed.    History of pulmonary embolus (PE)   History of DVT (deep vein thrombosis) Hold Xarelto and antiplatelet therapy.    DVT prophylaxis: SCDs. Code Status:   Full code. Family Communication:   Disposition Plan:   Patient is from:  Home.  Anticipated DC to:  Home.  Anticipated DC date:  02/05/2021 or 02/06/2021.  Anticipated DC barriers: Clinical status.  Consults called:  Dr. Havery Moros (gastroenterology). Admission status:   Observation/telemetry.   Severity of Illness: High severity in the setting of symptomatic anemia due to acute on chronic blood loss.  The patient will remain in the hospital for blood transfusion, H&H monitoring, IV pantoprazole and evaluation by gastroenterology.  Reubin Milan MD [facility]  How to contact the [facility] Attending or Consulting provider [place] or covering provider during after hours [place], for this patient?   Check the care team in [facility] and look for a) attending/consulting [facility] provider listed and b) the [facility] team listed Log into [email or web address] and use [place]'s universal password to access. If you do not have the password, please contact the hospital operator. Locate the [facility] provider you are looking for under [facility]  and page to a number that you can be directly reached. If you still have difficulty reaching the provider, please page the [facility] (Director on Call) for the Hospitalists listed on amion for assistance.  02/04/2021, 3:54 PM   This document was prepared using Dragon voice recognition software may contain some unintended transcription errors.

## 2021-02-04 NOTE — ED Provider Notes (Signed)
Emergency Medicine Provider Triage Evaluation Note  Ebony Scott , a 70 y.o. female  was evaluated in triage.  Pt complains of low hemoglobin.  Patient is on Xarelto, baby aspirin and Plavix.  She denies melena or hematochezia.  She has been feeling extremely weak, short of breath with exertion.  She seen by her primary care physician yesterday and had labs drawn she was told that her hemoglobin was 6.5.  She has had a blood transfusion in the past.  She does have a history of chronic kidney disease.  Review of Systems  Positive: Weakness Negative: Melena  Physical Exam  BP (!) 111/43 (BP Location: Right Arm)    Pulse 85    Temp 98.2 F (36.8 C) (Oral)    Resp 16    Ht 5\' 1"  (1.549 m)    Wt 58.1 kg    SpO2 98%    BMI 24.19 kg/m  Gen:   Awake, no distress   Resp:  Normal effort   MSK:   Moves extremities without difficulty   Other:  TTP LLQ abd pain   Medical Decision Making  Medically screening exam initiated at 11:00 AM.  Appropriate orders placed.  KAILEENA OBI was informed that the remainder of the evaluation will be completed by another provider, this initial triage assessment does not replace that evaluation, and the importance of remaining in the ED until their evaluation is complete.  Labs ordered.   Margarita Mail, PA-C 02/04/21 1105    Valarie Merino, MD 02/06/21 [phone]

## 2021-02-04 NOTE — Consult Note (Addendum)
[name]: 3:15 PM 02/04/2021  LOS: 0 days    Referring Provider: Dr. Olevia Bowens Primary Care Physician:  [name], MD Primary Gastroenterologist: Carlean Purl MD    Reason for Consultation: Anemia   HPI: Ebony Scott is a 70 y.o. female.  PMH rheumatoid arthritis on chronic prednisone 10 mg daily with resulting acquired Cushing's syndrome.  Not respond to or developed tolerance to numerous DMARDs.  She is also being treated with IVIG treatments by Dr. Neldon Mc.  DVT.  Graves' disease.  C. difficile infection in 2019.  SIRS 2019 tracheobronchomalacia.  Ischemic cardiomyopathy.  LVEF 35 to 40%, grade 2 DD by echo 08/2020.  Surgeries include but not limited to abdominal hysterectomy, appendectomy, lumpectomy, cholecystectomy.   04/2014 EGD.  For evaluation nausea.  Normal study of esophagus, stomach, examined duodenum. 03/2016 colonoscopy, screening study.  3 diminutive polyps were removed A renal stone CT in August 2022 showed scattered colon diverticuli.  Cardiac cath with drug-eluting stent placement to LAD.  01/11/2021.  Diagnosed with ischemic congestive cardiomyopathy, combined systolic/diastolic CHF.  Plavix and low-dose aspirin added to chronic Xarelto.   GI meds include Pepcid at night, Dexilant in the a.m.  Complains of progressive weakness, shortness of breath with exertion.  Advised to present to the ED. Hgb 6.3, MCV 107.  This compares with 9 60-months ago, 11 6-months ago, 9.9 70-month ago, 8.3/104 12 days ago,  BUN/creatinine 38/1.3.  These were 42/1.4 2 weeks ago. Patient had started taking OTC iron about a month ago and her stools got dark as a result.  She stopped taking this within the past several days and stools are little bit less dark.  She is compliant with daily Dexilant.  Does not use  NSAIDs.  Denies nausea and vomiting.  Appetite labile but overall reduced.  No previous blood transfusions.  Patient does not drink or smoke. A paternal uncle had colon cancer.    Past Medical History:  Diagnosis Date   Abscess of dorsum of right hand 09/22/2020   Acute deep vein thrombosis (DVT) of right lower extremity ([place]) 12/13/2017   Allergic rhinitis    Anemia    Angio-edema    Anxiety    pt denies   Arthritis    Phreesia 10/21/2019   Asthma    Phreesia 10/21/2019   Breast cancer ([place]) 1998   in remission   Cataract    REMOVED   Clostridium difficile colitis 01/14/2018   Clostridium difficile diarrhea 99/24/2683   Complication of anesthesia    "had hard time waking up from it several times" (02/20/2012)   Depression    "some; don't take anything for it" (02/20/2012)   Diverticulosis    DVT (deep venous thrombosis) ([place])    Fibromyalgia 11/2011   GERD (gastroesophageal reflux disease)    Graves disease    Headache(784.0)    "related to allergies; more at different times during the year" (02/20/2012)   Hemorrhoids    Hiatal hernia    back and neck   Hx of adenomatous colonic polyps 04/12/2016  Hypercholesteremia    good cholesterol is high   Hypothyroidism    IBS (irritable bowel syndrome)    Moderate persistent asthma    -FeV1 72% 2011, -IgE 102 2011, CT sinus Neg 2011   Osteoporosis    on reclast yearly   Peripheral vascular disease ([place]) 2019   DVTs   Pneumonia 04/2011; ~ 11/2011   "double; single" (02/20/2012)   Septic olecranon bursitis of right elbow 09/22/2020   Seronegative rheumatoid arthritis ([place])    Dr. Lahoma Rocker   SIRS (systemic inflammatory response syndrome) ([place]) 02/10/2018   Tracheobronchomalacia     Past Surgical History:  Procedure Laterality Date   ABDOMINAL HYSTERECTOMY N/A    Phreesia 10/21/2019   ANTERIOR AND POSTERIOR REPAIR  1990's   APPENDECTOMY     BREAST LUMPECTOMY  1998   left   BREAST SURGERY N/A    Phreesia  10/21/2019   BRONCHIAL WASHINGS  04/05/2020   Procedure: BRONCHIAL WASHINGS;  Surgeon: Freddi Starr, MD;  Location: [facility];  Service: Pulmonary;;   CARPOMETACARPEL ([place]) FUSION OF THUMB WITH AUTOGRAFT FROM RADIUS  ~ 2009   "both thumbs" (02/20/2012)   CATARACT EXTRACTION W/ INTRAOCULAR LENS  IMPLANT, BILATERAL  2012   CERVICAL DISCECTOMY  10/2001   C5-C6   CERVICAL FUSION  2003   C3-C4   CHOLECYSTECTOMY     COLONOSCOPY     CORONARY STENT INTERVENTION N/A 01/11/2021   Procedure: CORONARY STENT INTERVENTION;  Surgeon: Leonie Man, MD;  Location: [facility];  Service: Cardiovascular;  Laterality: N/A;   DEBRIDEMENT TENNIS ELBOW  ?1970's   right   ESOPHAGOGASTRODUODENOSCOPY     EYE SURGERY N/A    Phreesia 10/21/2019   HYSTERECTOMY     I & D EXTREMITY Right 09/22/2020   Procedure: IRRIGATION AND DEBRIDEMENT RIGHT HAND AND ELBOW;  Surgeon: Marchia Bond, MD;  Location: [facility];  Service: Orthopedics;  Laterality: Right;   KNEE ARTHROPLASTY  ?1990's   "?right; w/cartilage repair" (02/20/2012)   NASAL SEPTUM SURGERY  1980's   POSTERIOR CERVICAL FUSION/FORAMINOTOMY  2004   "failed initial fusion; rewired  anterior neck" (02/20/2012)   REVERSE SHOULDER ARTHROPLASTY Right 02/16/2020   Procedure: REVERSE SHOULDER ARTHROPLASTY;  Surgeon: Marchia Bond, MD;  Location: [facility];  Service: Orthopedics;  Laterality: Right;   RIGHT/LEFT HEART CATH AND CORONARY ANGIOGRAPHY N/A 01/11/2021   Procedure: RIGHT/LEFT HEART CATH AND CORONARY ANGIOGRAPHY;  Surgeon: Leonie Man, MD;  Location: [facility];  Service: Cardiovascular;  Laterality: N/A;   SPINE SURGERY N/A    Phreesia 10/21/2019   TONSILLECTOMY  ~ 1953   VESICOVAGINAL FISTULA CLOSURE W/ TAH  1988   VIDEO BRONCHOSCOPY Bilateral 08/23/2016   Procedure: VIDEO BRONCHOSCOPY WITH FLUORO;  Surgeon: Javier Glazier, MD;  [facility]: [facility];  Service: Cardiopulmonary;  Laterality: Bilateral;   VIDEO BRONCHOSCOPY N/A  04/05/2020   Procedure: VIDEO BRONCHOSCOPY WITHOUT FLUORO;  Surgeon: Freddi Starr, MD;  Location: [facility];  Service: Pulmonary;  Laterality: N/A;    Prior to Admission medications   Medication Sig Start Date End Date Taking? Authorizing Provider  acetaminophen (TYLENOL) 650 MG CR tablet Take 1,300 mg by mouth every 8 (eight) hours as needed for pain.   Yes [provider]  albuterol (PROVENTIL) (2.5 MG/3ML) 0.083% nebulizer solution Take 3 mLs (2.5 mg total) by nebulization every 4 (four) hours as needed for wheezing or shortness of breath. 10/11/20  Yes Darrick Meigs, Marge Duncans, MD  albuterol (VENTOLIN HFA) 108 (  90 Base) MCG/ACT inhaler Inhale 2 puffs into the lungs every 6 (six) hours as needed for wheezing or shortness of breath.   Yes [provider]  allopurinol (ZYLOPRIM) 100 MG tablet Take 100 mg by mouth daily. 11/19/19  Yes [provider]  Alpha-D-Galactosidase (BEANO PO) Take 2 tablets by mouth 3 (three) times daily with meals.   Yes [provider]  Alpha-Lipoic Acid 600 MG TABS Take 600 mg by mouth daily.   Yes [provider]  aspirin EC 81 MG tablet Take 81 mg by mouth daily. Swallow whole.   Yes [provider]  atorvastatin (LIPITOR) 80 MG tablet TAKE 1 TABLET BY MOUTH DAILY 12/19/20  Yes [name], MD  azelastine (ASTELIN) 0.1 % nasal spray Use in each nostril as directed Patient taking differently: 2 sprays daily as needed for allergies. 11/01/20  Yes Kozlow, Donnamarie Poag, MD  CALCIUM-MAGNESIUM PO Take 1 tablet by mouth every evening.   Yes [provider]  cetirizine (ZYRTEC) 10 MG tablet Take 1 tablet (10 mg total) by mouth daily. Patient taking differently: Take 10 mg by mouth at bedtime. 11/01/20  Yes Kozlow, Donnamarie Poag, MD  cholecalciferol (VITAMIN D) 25 MCG (1000 UNIT) tablet 1,000 Units every evening.   Yes [provider]  clopidogrel (PLAVIX) 75 MG tablet Take 1 tablet (75 mg total) by mouth daily.  01/11/21 01/11/22 Yes Bhagat, Bhavinkumar, PA  DEXILANT 60 MG capsule TAKE 1 CAPSULE BY MOUTH EACH MORNING 02/02/20  Yes [name], MD  diclofenac Sodium (VOLTAREN) 1 % GEL Apply 1 application topically 4 (four) times daily as needed (pain).   Yes [provider]  empagliflozin (JARDIANCE) 10 MG TABS tablet Take 1 tablet (10 mg total) by mouth daily before breakfast. 12/05/20  Yes [name], MD  EPINEPHrine 0.3 mg/0.3 mL IJ SOAJ injection USE AS DIRECTED BY YOUR PHYSICIAN INTRAMUSCULARLY AS NEEDED FOR ANAPHYLAXIS 12/05/20  Yes Kozlow, Donnamarie Poag, MD  famotidine (PEPCID) 20 MG tablet Take 1 tablet (20 mg total) by mouth daily. Patient taking differently: Take 20 mg by mouth at bedtime. 12/27/20  Yes Kozlow, Donnamarie Poag, MD  fluticasone (FLONASE) 50 MCG/ACT nasal spray USE 2 SPRAYS IN EACH NOSTRIL DAILY Patient taking differently: 2 sprays daily as needed for allergies. 09/02/20  Yes Freddi Starr, MD  folic acid (FOLVITE) 1 MG tablet Take 1 mg by mouth daily.   Yes [provider]  furosemide (LASIX) 40 MG tablet Take 1 tablet (40 mg total) by mouth daily. Take 1 table twice a day for 3 days and then back to once daily. Patient taking differently: Take 40 mg by mouth daily. 01/19/21  Yes Loel Dubonnet, NP  glipiZIDE (GLUCOTROL XL) 5 MG 24 hr tablet Take 5 mg by mouth daily with breakfast.   Yes [provider]  guaiFENesin (MUCINEX) 600 MG 12 hr tablet Take 1 tablet (600 mg total) by mouth 2 (two) times daily as needed for cough or to loosen phlegm. Patient taking differently: Take 600 mg by mouth 2 (two) times daily. 05/09/20  Yes PickardCammie Mcgee, MD  HIZENTRA 2 GM/10ML SOLN Inject 1 Dose into the skin once a week. 01/04/21  Yes [provider]  Lactase (LACTOSE FAST ACTING RELIEF PO) Take 3 tablets by mouth in the morning, at noon, and at bedtime.   Yes [provider]  magnesium oxide (MAG-OX) 400 MG tablet Take 400 mg by mouth every  evening.   Yes [provider]  montelukast (SINGULAIR) 10 MG tablet Take 1 tablet (10 mg total) by mouth daily. Patient taking differently: Take 10 mg by mouth every evening. 11/01/20  Yes Kozlow, Donnamarie Poag, MD  Nystatin (GERHARDT'S BUTT CREAM) CREA Apply topically 3 times a day as needed for itching or irritation 12/30/20  Yes [name], MD  nystatin (MYCOSTATIN) 100000 UNIT/ML suspension Use as directed 15 mLs in the mouth or throat at bedtime as needed (mouth sores).   Yes [provider]  nystatin ointment (MYCOSTATIN) Apply 1 application topically 2 (two) times daily as needed (yeast).   Yes [provider]  predniSONE (DELTASONE) 1 MG tablet Take 3 mg by mouth as directed. Take along with 5 mg tablet=8 mg   Yes [provider]  predniSONE (DELTASONE) 5 MG tablet Take 5 mg by mouth as directed. Take along with 3 tablets of (1 mg)=8 mg   Yes [provider]  Prenatal Vit-Fe Fumarate-FA (PRENATAL VITAMINS PO) Take 1 tablet by mouth daily.   Yes [provider]  PRESCRIPTION MEDICATION Place 4 drops into both ears See admin instructions. Chloramp- sulfu - ampho d-h droc 100-100-5 hydrocortisone, otic ear drops. Instill 4 drops in to each ear daily for the first 5 days of each month   Yes [provider]  Probiotic Product (ALIGN PO) Take 1 capsule by mouth daily.   Yes [provider]  Propylene Glycol (SYSTANE COMPLETE) 0.6 % SOLN Place 1 drop into both eyes 2 (two) times daily.   Yes [provider]  Tiotropium Bromide-Olodaterol (STIOLTO RESPIMAT) 2.5-2.5 MCG/ACT AERS Inhale 2 puffs into the lungs daily. 08/05/20  Yes Freddi Starr, MD  traMADol (ULTRAM) 50 MG tablet Take 1 tablet (50 mg total) by mouth at bedtime as needed. Patient taking differently: Take 50 mg by mouth at bedtime. 12/05/20  Yes PickardCammie Mcgee, MD  XARELTO 20 MG TABS tablet TAKE 1 TABLET BY MOUTH DAILY WITH SUPPER 02/02/20  Yes [name], MD  benzonatate (TESSALON) 200 MG capsule Take 1 capsule (200 mg total) by mouth 3 (three) times daily as needed for cough. Patient not taking: Reported on 02/04/2021 11/03/20   [name], MD  budesonide (PULMICORT) 0.5 MG/2ML nebulizer solution Take 2 mLs (0.5 mg total) by nebulization 2 (two) times daily as needed. 11/15/20   Freddi Starr, MD  Glucose Blood (BLOOD GLUCOSE TEST STRIPS) STRP Please dispense per patient and insurance preference. Use as directed to monitor FSBS 4x . Dx: E11.65 09/08/20   [name], MD  predniSONE (DELTASONE) 10 MG tablet Take 1 tablet (10 mg total) by mouth daily with breakfast. Take with additional 2 tabs of 1mg  Prednisone to equal 12mg  daily. Patient not taking: Reported on 02/04/2021 11/09/20   [name], MD  potassium chloride SA (KLOR-CON) 20 MEQ tablet Take 1 tablet (20 mEq total) by mouth daily. Patient not taking: No sig reported 08/30/20 09/23/20  [name], MD    Scheduled Meds:  denosumab  60 mg Subcutaneous Q6 months   pantoprazole (PROTONIX) IV  40 mg Intravenous Q12H   Infusions:  PRN Meds: acetaminophen **OR** acetaminophen, ondansetron **OR** ondansetron (ZOFRAN) IV   Allergies as of 02/04/2021 - Review Complete 02/04/2021  Allergen Reaction Noted   Baclofen Other (See Comments) 02/27/2020   Dust mite extract Shortness Of Breath and Other (See Comments) 07/05/2010   Molds & smuts Shortness Of Breath    Morphine and related Hives and  Itching 10/21/2019   Other Shortness Of Breath and Other (See Comments) 11/28/2011   Penicillins Rash and Other (See Comments)    Rofecoxib Swelling and Other (See Comments) 08/08/2020   Shellfish allergy Anaphylaxis, Shortness Of Breath, Itching, and Rash 10/21/2019   Tetracycline hcl Nausea And Vomiting    Xolair [omalizumab] Other (See Comments) 08/16/2016   Zoledronic acid Other (See Comments) 04/01/2012   Dilaudid [hydromorphone hcl] Itching 02/21/2012    Levofloxacin Other (See Comments) 08/08/2020   Oxycodone hcl Nausea And Vomiting    Paroxetine Nausea And Vomiting and Other (See Comments)    Celecoxib Swelling and Other (See Comments) 05/03/2015   Diltiazem hcl Swelling 08/08/2020   Hydrocodone-acetaminophen Other (See Comments) 08/08/2020   Lactose intolerance (gi) Other (See Comments) 12/14/2017   Morphine sulfate Other (See Comments) 08/08/2020   Oxycodone-acetaminophen Other (See Comments) 08/08/2020   Rituximab  01/06/2021   Tree extract Other (See Comments) 09/20/2010   Penicillin g procaine Rash and Other (See Comments) 08/08/2020    Family History  Problem Relation Age of Onset   Allergies Mother    Heart disease Mother    Arthritis Mother    Lung cancer Mother    Diabetes Mother    Allergies Father    Heart disease Father    Arthritis Father    Stroke Father    Diabetes Maternal Grandfather    Colitis Daughter    Colon cancer Other        Maternal half aunt/Maternal half uncle    Social History   Socioeconomic History   Marital status: Divorced    Spouse name: Not on file   Number of children: 2   Years of education: college   Highest education level: Not on file  Occupational History   Occupation: Disabled    Comment: Retired Engineer, production: RETIRED  Tobacco Use   Smoking status: Never    Passive exposure: Yes   Smokeless tobacco: Never   Tobacco comments:    Parents  Vaping Use   Vaping Use: Never used  Substance and Sexual Activity   Alcohol use: Not Currently    Alcohol/week: 0.0 standard drinks   Drug use: No   Sexual activity: Never  Other Topics Concern   Not on file  Social History Narrative   Patient lives at home alone. Patient  divorced.    Patient has her BS degree.   Right handed.   Caffeine- sometimes coffee.      [facility]:   Born in [place], [place]. She worked as a Copywriter, advertising. She has no pets currently. She does have indoor plants.  Previously had mold in her home that was remediated. Carpet was removed.          Social Determinants of Health   Financial Resource Strain: Low Risk    Difficulty of Paying Living Expenses: Not hard at all  Food Insecurity: No Food Insecurity   Worried About Charity fundraiser in the Last Year: Never true   [place] in the Last Year: Never true  Transportation Needs: No Transportation Needs   Lack of Transportation (Medical): No   Lack of Transportation (Non-Medical): No  Physical Activity: Inactive   Days of Exercise per Week: 0 days   Minutes of Exercise per Session: 0 min  Stress: No Stress Concern Present   Feeling of Stress : Not at all  Social Connections: Moderately Isolated   Frequency of Communication with Friends  and Family: More than three times a week   Frequency of Social Gatherings with Friends and Family: More than three times a week   Attends Religious Services: Never   Marine scientist or Organizations: Yes   Attends Archivist Meetings: Never   Marital Status: Divorced  Human resources officer Violence: Not At Risk   Fear of Current or Ex-Partner: No   Emotionally Abused: No   Physically Abused: No   Sexually Abused: No    REVIEW OF SYSTEMS: Constitutional: Weakness, fatigue ENT:  No nose bleeds Pulm: Shortness of breath with exertion.  No cough. CV:  No palpitations, no LE edema.  No angina GU:  No hematuria, no frequency GI: No dysphagia.  See HPI. Heme: Occasionally see some blood when she blows her nose but no aggressive epistaxis. Transfusions: None Neuro:  No headaches, no peripheral tingling or numbness no syncope, no seizures Derm:  No itching, no rash or sores.  Endocrine:  No sweats or chills.  No polyuria or dysuria Immunization: Vaccination history reviewed. Travel:  None beyond local counties in last few months.    PHYSICAL EXAM: Vital signs in last 24 hours: Vitals:   02/04/21 1300 02/04/21 1430  BP: (!) 118/47 (!)  114/56  Pulse: 71 67  Resp: 17 11  Temp:    SpO2: 97% 99%   Wt Readings from Last 3 Encounters:  02/04/21 58.1 kg  01/23/21 58.1 kg  01/19/21 59 kg    General: Patient is a chronically ill cushingoid looking woman.  She looks younger than stated age but looks unwell.  In no distress resting comfortably on stretcher Head: No facial asymmetry.  Facies cushingoid.  No signs of head trauma. Eyes: Conjunctival pallor.  EOMI.  Slight exophthalmos, arcus senilis. Ears: Not hard of hearing Nose: No congestion or discharge Mouth: Fair dentition.  Tongue midline.  Mucosa moist, pink, clear. Neck: No JVD, no masses, no thyromegaly Lungs: Clear bilaterally.  No labored breathing or cough Heart: RRR.  No MRG.  S1, S2 present Abdomen: Soft without tenderness.  No HSM, masses, bruits, hernias.  Obese without protuberance..   Rectal: Deferred.  Patient said she had DRE but I do not see any documentation of this or FOBT results. Musc/Skeltl: No joint redness.  Boyant, soft mass at L metatarsal.   Extremities: No CCE. Neurologic: Alert.  Appropriate.  Oriented x3.  Moves all 4 limbs without tremor, strength not tested. Skin: Pale.  No telangiectasia.  No significant bruising. Nodes: No cervical adenopathy Psych: Cooperative, calm, pleasant.  Intake/Output from previous day: No intake/output data recorded. Intake/Output this shift: No intake/output data recorded.  LAB RESULTS: Recent Labs    02/03/21 0817 02/04/21 1150  WBC 11.2* 18.0*  HGB 6.4* 6.3*  HCT 20.6* 21.4*  PLT 306 317   BMET Lab Results  Component Value Date   NA 137 02/04/2021   NA 141 02/03/2021   NA 142 01/23/2021   K 3.7 02/04/2021   K 4.4 02/03/2021   K 4.9 01/23/2021   CL 102 02/04/2021   CL 102 02/03/2021   CL 103 01/23/2021   CO2 26 02/04/2021   CO2 20 02/03/2021   CO2 28 01/23/2021   GLUCOSE 164 (H) 02/04/2021   GLUCOSE 123 (H) 02/03/2021   GLUCOSE 102 (H) 01/23/2021   BUN 38 (H) 02/04/2021   BUN 34  (H) 02/03/2021   BUN 42 (H) 01/23/2021   CREATININE 1.35 (H) 02/04/2021   CREATININE 1.14 (H) 02/03/2021  CREATININE 1.44 (H) 01/23/2021   CALCIUM 8.5 (L) 02/04/2021   CALCIUM 8.7 02/03/2021   CALCIUM 9.3 01/23/2021   LFT Recent Labs    02/04/21 1150  PROT 6.5  ALBUMIN 3.2*  AST 30  ALT 33  ALKPHOS 66  BILITOT 0.6   PT/INR Lab Results  Component Value Date   INR 1.1 02/04/2021   INR 1.6 (H) 10/04/2020   INR 1.0 09/22/2020   Hepatitis Panel No results for input(s): HEPBSAG, HCVAB, HEPAIGM, HEPBIGM in the last 72 hours. C-Diff No components found for: CDIFF Lipase  No results found for: LIPASE  Drugs of Abuse     Component Value Date/Time   LABOPIA NONE DETECTED 02/23/2020 1300   COCAINSCRNUR NONE DETECTED 02/23/2020 1300   COCAINSCRNUR NEG 05/31/2014 1353   LABBENZ NONE DETECTED 02/23/2020 1300   AMPHETMU NONE DETECTED 02/23/2020 1300   THCU NONE DETECTED 02/23/2020 1300   LABBARB NONE DETECTED 02/23/2020 1300     RADIOLOGY STUDIES: DG Chest Port 1 View  Result Date: 02/04/2021 CLINICAL DATA:  Per chart: Patient reports she had blood work at [facility] [employer] office yesterday, pcp office called today and told patient to come to ED due to hemoglobin being 6.5. Hx of asthma, DVT EXAM: PORTABLE CHEST 1 VIEW COMPARISON:  10/06/2020 and older studies. FINDINGS: Cardiac silhouette mildly enlarged.  No mediastinal or hilar masses. Clear lungs.  No convincing pleural effusion.  No pneumothorax. Stable right reverse shoulder arthroplasty. IMPRESSION: No acute cardiopulmonary disease. Electronically Signed   By: Lajean Manes M.D.   On: 02/04/2021 13:39      IMPRESSION:   Macrocytic anemia.  About to receive 1 PRBC.  Anemia has been progressive over 4 months.  Takes chronic Dexilant.  Currently being covered with Protonix 40 mg iv bid.    Cardiac stenting last month.  Low-dose aspirin and Plavix added to chronic Xarelto chronically in use for history DVT.  All 3 of these are now on  hold.  Last Plavix was 12/17 and plan was to discontinue this after 12/21.     Seronegative rheumatoid arthritis.  Failed to respond to or lost response to multiple DMARDs.  Now on Prednisone 10 mg daily.      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  CKD.  Her current BUN and creatinine, GFR are improved compared with 2 weeks prior.    PLAN:        EGD, colonoscopy?     getting unit of blood now.  CBC will be repeated tomorrow morning, H&H after completion of PRBC.  As this does not appear to be an aggressive upper GI bleed I am going to advance her to a full liquid diet   Azucena Freed  02/04/2021, 3:15 PM Phone [phone]

## 2021-02-04 NOTE — Telephone Encounter (Signed)
Pt seen 01/19/21 for follow up after cardiac cath with bruising. CBC ordered and Hb 8.6 mildly low so 2 week follow up labs recommended.   01/19/21 Hb 8.6 ? 01/23/21 Hb 8.3 ? 02/03/21 Hb 6.4. Unclear why lab was not called by [facility] given critical result, will follow up with them on Monday. BMP shows kidney function returned to baseline, normal electrolytes.   Given low hemoglobin <7 recommend evaluation in the ED.  I have called her mobile phone, home phone, and daughters phone today at 20. There was no answer but left detailed VM on all three phone with instructions to present to ED for evaluation.   I have made our on-call team aware and they have kindly agreed to continue attempts to reach her.  Loel Dubonnet, NP

## 2021-02-04 NOTE — ED Provider Notes (Signed)
[facility] DEPT Provider Note   CSN: [id number] Arrival date & time: 02/04/21  1043     History Chief Complaint  Patient presents with   low hemoglobin     Ebony Scott is a 70 y.o. female.  70 year old female with prior medical history as detailed below presents for evaluation.  Patient was advised that she had a low hemoglobin after lab work performed yesterday.  Patient complains of feeling weak and fatigued especially over the last 4 to 5 days.  Patient is taking Xarelto, aspirin, and Plavix.  Patient reports darker colored stool approximately 1 week ago.  She denies any dark stool or obvious melena in the last several days.  Patient denies nausea or vomiting.  She denies hematemesis.  She does have prior history of significant anemia requiring blood transfusion.  She denies recent prior transfusion.  She denies recent fever.  She denies abdominal pain.  She denies chest pain.  She does report increased weakness with some mild shortness of breath with exertion.  This is become worse over the last several days as well.  The history is provided by the patient.  Illness Location:  Decreased hemoglobin, fatigue, weakness, dyspnea on exertion Severity:  Moderate Onset quality:  Gradual Duration:  4 days Timing:  Constant Progression:  Worsening Chronicity:  New     Past Medical History:  Diagnosis Date   Abscess of dorsum of right hand 09/22/2020   Acute deep vein thrombosis (DVT) of right lower extremity ([place]) 12/13/2017   Allergic rhinitis    Anemia    Angio-edema    Anxiety    pt denies   Arthritis    Phreesia 10/21/2019   Asthma    Phreesia 10/21/2019   Breast cancer ([place]) 1998   in remission   Cataract    REMOVED   Clostridium difficile colitis 01/14/2018   Clostridium difficile diarrhea 50/53/9767   Complication of anesthesia    "had hard time waking up from it several times" (02/20/2012)   Depression    "some; don't take  anything for it" (02/20/2012)   Diverticulosis    DVT (deep venous thrombosis) ([place])    Fibromyalgia 11/2011   GERD (gastroesophageal reflux disease)    Graves disease    Headache(784.0)    "related to allergies; more at different times during the year" (02/20/2012)   Hemorrhoids    Hiatal hernia    back and neck   Hx of adenomatous colonic polyps 04/12/2016   Hypercholesteremia    good cholesterol is high   Hypothyroidism    IBS (irritable bowel syndrome)    Moderate persistent asthma    -FeV1 72% 2011, -IgE 102 2011, CT sinus Neg 2011   Osteoporosis    on reclast yearly   Peripheral vascular disease ([place]) 2019   DVTs   Pneumonia 04/2011; ~ 11/2011   "double; single" (02/20/2012)   Septic olecranon bursitis of right elbow 09/22/2020   Seronegative rheumatoid arthritis ([place])    Dr. Lahoma Rocker   SIRS (systemic inflammatory response syndrome) ([place]) 02/10/2018   Tracheobronchomalacia     Patient Active Problem List   Diagnosis Date Noted   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[place])  01/11/2021   Coronary artery calcification seen on CAT scan 01/11/2021   ARF (acute renal failure) ([place]) 10/04/2020   Physical deconditioning 09/25/2020   History of DVT (deep vein thrombosis) 09/24/2020   Osteomyelitis of second toe of right foot ([place]) 09/23/2020   DMII (diabetes mellitus, type 2) ([place])  09/23/2020   Cellulitis of right hand 09/22/2020   Septic olecranon bursitis of right elbow 09/22/2020   Abscess of dorsum of right hand 09/22/2020   Hoarseness 32/20/2542   Metabolic encephalopathy 70/62/3762   Acute encephalopathy    AMS (altered mental status) 02/23/2020   S/P reverse total shoulder arthroplasty, right    Hypokalemia    DOE (dyspnea on exertion) 02/18/2020   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   S/P reverse total shoulder arthroplasty, left 02/16/2020   Preoperative clearance 01/26/2020   Closed displaced fracture of metatarsal bone of right foot 01/20/2019    History of pulmonary embolus (PE) 12/19/2018   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   Rheumatoid arthritis ([place])    Bilateral impacted cerumen 08/18/2018   Fungal otitis externa 08/18/2018   Asthmatic bronchitis with exacerbation 07/29/2018   Pulmonary embolism ([place]) 07/29/2018   Cellulitis of forearm    Fever of unknown origin (FUO) 02/10/2018   Abdominal pain 02/10/2018   Fracture of base of fifth metatarsal bone with routine healing, left    Closed fracture of fifth metatarsal bone of right foot, initial encounter 01/15/2018   DVT (deep venous thrombosis) ([place]) 01/14/2018   Hypothyroidism 01/14/2018   Depression 01/14/2018   Debility    Malnutrition of moderate degree 12/17/2017   Knee pain    History of breast cancer    Chronic pain syndrome    Fibromyalgia    Graves disease    Vasculitis ([place]) 12/13/2017   Rhabdomyolysis 12/13/2017   Dysphonia 08/10/2017   Pulmonary nodules    Chronic kidney disease, stage 3b ([place]) 04/15/2016   Hx of adenomatous colonic polyps 04/12/2016   Cough 02/09/2016   Opacity of lung on imaging study 11/03/2015   Severe persistent asthma 02/08/2015   Facial pain syndrome 02/08/2015   Insomnia 02/08/2015   Other allergic rhinitis 02/08/2015   LPRD (laryngopharyngeal reflux disease) 02/08/2015   Chronic combined systolic and diastolic CHF (congestive heart failure) ([place]) 02/08/2015   Benign paroxysmal positional vertigo 12/03/2012   Dizziness and giddiness 10/29/2012   Disequilibrium 10/29/2012   Pseudogout 02/24/2012   Pneumonia 05/31/2011   Irritable bowel syndrome 01/02/2010   Hyperlipidemia 12/23/2009   Hyperthyroidism 11/12/2006   Seasonal and perennial allergic rhinitis 11/12/2006   GERD 11/12/2006   NECK PAIN, CHRONIC 11/12/2006   OSTEOPOROSIS 11/12/2006   BREAST CANCER, HX OF 11/12/2006    Past Surgical History:  Procedure Laterality Date   ABDOMINAL HYSTERECTOMY N/A    Phreesia 10/21/2019   ANTERIOR AND POSTERIOR REPAIR  1990's    APPENDECTOMY     BREAST LUMPECTOMY  1998   left   BREAST SURGERY N/A    Phreesia 10/21/2019   BRONCHIAL WASHINGS  04/05/2020   Procedure: BRONCHIAL WASHINGS;  Surgeon: Freddi Starr, MD;  Location: [facility];  Service: Pulmonary;;   CARPOMETACARPEL ([place]) FUSION OF THUMB WITH AUTOGRAFT FROM RADIUS  ~ 2009   "both thumbs" (02/20/2012)   CATARACT EXTRACTION W/ INTRAOCULAR LENS  IMPLANT, BILATERAL  2012   CERVICAL DISCECTOMY  10/2001   C5-C6   CERVICAL FUSION  2003   C3-C4   CHOLECYSTECTOMY     COLONOSCOPY     CORONARY STENT INTERVENTION N/A 01/11/2021   Procedure: CORONARY STENT INTERVENTION;  Surgeon: Leonie Man, MD;  Location: [facility];  Service: Cardiovascular;  Laterality: N/A;   DEBRIDEMENT TENNIS ELBOW  ?1970's   right   ESOPHAGOGASTRODUODENOSCOPY     EYE SURGERY N/A    Phreesia 10/21/2019   HYSTERECTOMY  I & D EXTREMITY Right 09/22/2020   Procedure: IRRIGATION AND DEBRIDEMENT RIGHT HAND AND ELBOW;  Surgeon: Marchia Bond, MD;  Location: [facility];  Service: Orthopedics;  Laterality: Right;   KNEE ARTHROPLASTY  ?1990's   "?right; w/cartilage repair" (02/20/2012)   NASAL SEPTUM SURGERY  1980's   POSTERIOR CERVICAL FUSION/FORAMINOTOMY  2004   "failed initial fusion; rewired  anterior neck" (02/20/2012)   REVERSE SHOULDER ARTHROPLASTY Right 02/16/2020   Procedure: REVERSE SHOULDER ARTHROPLASTY;  Surgeon: Marchia Bond, MD;  [facility];  Service: Orthopedics;  Laterality: Right;   RIGHT/LEFT HEART CATH AND CORONARY ANGIOGRAPHY N/A 01/11/2021   Procedure: RIGHT/LEFT HEART CATH AND CORONARY ANGIOGRAPHY;  Surgeon: Leonie Man, MD;  Location: [facility];  Service: Cardiovascular;  Laterality: N/A;   SPINE SURGERY N/A    Phreesia 10/21/2019   TONSILLECTOMY  ~ 1953   VESICOVAGINAL FISTULA CLOSURE W/ TAH  1988   VIDEO BRONCHOSCOPY Bilateral 08/23/2016   Procedure: VIDEO BRONCHOSCOPY WITH FLUORO;  Surgeon: Javier Glazier, MD;  [facility];  Service: Cardiopulmonary;  Laterality: Bilateral;   VIDEO BRONCHOSCOPY N/A 04/05/2020   Procedure: VIDEO BRONCHOSCOPY WITHOUT FLUORO;  Surgeon: Freddi Starr, MD;  [facility];  Service: Pulmonary;  Laterality: N/A;     OB History   No obstetric history on file.     Family History  Problem Relation Age of Onset   Allergies Mother    Heart disease Mother    Arthritis Mother    Lung cancer Mother    Diabetes Mother    Allergies Father    Heart disease Father    Arthritis Father    Stroke Father    Diabetes Maternal Grandfather    Colitis Daughter    Colon cancer Other        Maternal half aunt/Maternal half uncle    Social History   Tobacco Use   Smoking status: Never    Passive exposure: Yes   Smokeless tobacco: Never   Tobacco comments:    Parents  Vaping Use   Vaping Use: Never used  Substance Use Topics   Alcohol use: Not Currently    Alcohol/week: 0.0 standard drinks   Drug use: No    Home Medications Prior to Admission medications   Medication Sig Start Date End Date Taking? Authorizing Provider  albuterol (PROVENTIL) (2.5 MG/3ML) 0.083% nebulizer solution Take 3 mLs (2.5 mg total) by nebulization every 4 (four) hours as needed for wheezing or shortness of breath. 10/11/20  Yes Darrick Meigs, Marge Duncans, MD  albuterol (VENTOLIN HFA) 108 (90 Base) MCG/ACT inhaler Inhale 2 puffs into the lungs every 6 (six) hours as needed for wheezing or shortness of breath.   Yes [provider]  allopurinol (ZYLOPRIM) 100 MG tablet Take 100 mg by mouth daily. 11/19/19  Yes [provider]  Alpha-D-Galactosidase (BEANO PO) Take 2 tablets by mouth 3 (three) times daily with meals.   Yes [provider]  Alpha-Lipoic Acid 600 MG TABS Take 600 mg by mouth daily.   Yes [provider]  aspirin EC 81 MG tablet Take 81 mg by mouth daily. Swallow whole.   Yes [provider]  atorvastatin (LIPITOR) 80 MG tablet TAKE 1 TABLET BY  MOUTH DAILY 12/19/20  Yes [name], MD  azelastine (ASTELIN) 0.1 % nasal spray Use in each nostril as directed Patient taking differently: 2 sprays daily as needed for allergies. 11/01/20  Yes Kozlow, Donnamarie Poag, MD  CALCIUM-MAGNESIUM PO Take  1 tablet by mouth every evening.   Yes [provider]  cetirizine (ZYRTEC) 10 MG tablet Take 1 tablet (10 mg total) by mouth daily. Patient taking differently: Take 10 mg by mouth at bedtime. 11/01/20  Yes Kozlow, Donnamarie Poag, MD  cholecalciferol (VITAMIN D) 25 MCG (1000 UNIT) tablet 1,000 Units every evening.   Yes [provider]  clopidogrel (PLAVIX) 75 MG tablet Take 1 tablet (75 mg total) by mouth daily. 01/11/21 01/11/22 Yes Bhagat, Bhavinkumar, PA  DEXILANT 60 MG capsule TAKE 1 CAPSULE BY MOUTH EACH MORNING 02/02/20  Yes [name], MD  diclofenac Sodium (VOLTAREN) 1 % GEL Apply 1 application topically 4 (four) times daily as needed (pain).   Yes [provider]  empagliflozin (JARDIANCE) 10 MG TABS tablet Take 1 tablet (10 mg total) by mouth daily before breakfast. 12/05/20  Yes [name], MD  EPINEPHrine 0.3 mg/0.3 mL IJ SOAJ injection USE AS DIRECTED BY YOUR PHYSICIAN INTRAMUSCULARLY AS NEEDED FOR ANAPHYLAXIS 12/05/20  Yes Kozlow, Donnamarie Poag, MD  famotidine (PEPCID) 20 MG tablet Take 1 tablet (20 mg total) by mouth daily. Patient taking differently: Take 20 mg by mouth at bedtime. 12/27/20  Yes Kozlow, Donnamarie Poag, MD  fluticasone (FLONASE) 50 MCG/ACT nasal spray USE 2 SPRAYS IN EACH NOSTRIL DAILY Patient taking differently: 2 sprays daily as needed for allergies. 09/02/20  Yes Freddi Starr, MD  folic acid (FOLVITE) 1 MG tablet Take 1 mg by mouth daily.   Yes [provider]  furosemide (LASIX) 40 MG tablet Take 1 tablet (40 mg total) by mouth daily. Take 1 table twice a day for 3 days and then back to once daily. Patient taking differently: Take 40 mg by mouth daily. 01/19/21  Yes Loel Dubonnet, NP   glipiZIDE (GLUCOTROL XL) 5 MG 24 hr tablet Take 5 mg by mouth daily with breakfast.   Yes [provider]  guaiFENesin (MUCINEX) 600 MG 12 hr tablet Take 1 tablet (600 mg total) by mouth 2 (two) times daily as needed for cough or to loosen phlegm. Patient taking differently: Take 600 mg by mouth 2 (two) times daily. 05/09/20  Yes [name], MD  magnesium oxide (MAG-OX) 400 MG tablet Take 400 mg by mouth every evening.   Yes [provider]  montelukast (SINGULAIR) 10 MG tablet Take 1 tablet (10 mg total) by mouth daily. Patient taking differently: Take 10 mg by mouth every evening. 11/01/20  Yes Kozlow, Donnamarie Poag, MD  Nystatin (GERHARDT'S BUTT CREAM) CREA Apply topically 3 times a day as needed for itching or irritation 12/30/20  Yes [name], MD  nystatin (MYCOSTATIN) 100000 UNIT/ML suspension Use as directed 15 mLs in the mouth or throat at bedtime as needed (mouth sores).   Yes [provider]  nystatin ointment (MYCOSTATIN) Apply 1 application topically 2 (two) times daily as needed (yeast).   Yes [provider]  predniSONE (DELTASONE) 10 MG tablet Take 1 tablet (10 mg total) by mouth daily with breakfast. Take with additional 2 tabs of 1mg  Prednisone to equal 12mg  daily. Patient taking differently: Take 10 mg by mouth daily with breakfast. 11/09/20  Yes [name], MD  Prenatal Vit-Fe Fumarate-FA (PRENATAL VITAMINS PO) Take 1 tablet by mouth daily.   Yes [provider]  Probiotic Product (ALIGN PO) Take 1 capsule by mouth daily.   Yes [provider]  Propylene Glycol (SYSTANE COMPLETE) 0.6 % SOLN Place 1 drop into both eyes  2 (two) times daily.   Yes [provider]  Tiotropium Bromide-Olodaterol (STIOLTO RESPIMAT) 2.5-2.5 MCG/ACT AERS Inhale 2 puffs into the lungs daily. 08/05/20  Yes Freddi Starr, MD  traMADol (ULTRAM) 50 MG tablet Take 1 tablet (50 mg total) by mouth at bedtime as needed. Patient taking  differently: Take 50 mg by mouth at bedtime. 12/05/20  Yes [name], MD  XARELTO 20 MG TABS tablet TAKE 1 TABLET BY MOUTH DAILY WITH SUPPER 02/02/20  Yes [name], MD  acetaminophen (TYLENOL) 650 MG CR tablet Take 1,300 mg by mouth every 8 (eight) hours as needed for pain.    [provider]  benzonatate (TESSALON) 200 MG capsule Take 1 capsule (200 mg total) by mouth 3 (three) times daily as needed for cough. Patient not taking: Reported on 02/04/2021 11/03/20   [name], MD  budesonide (PULMICORT) 0.5 MG/2ML nebulizer solution Take 2 mLs (0.5 mg total) by nebulization 2 (two) times daily as needed. 11/15/20   Freddi Starr, MD  Glucose Blood (BLOOD GLUCOSE TEST STRIPS) STRP Please dispense per patient and insurance preference. Use as directed to monitor FSBS 4x . Dx: E11.65 09/08/20   [name], MD  HIZENTRA 2 GM/10ML SOLN Inject 1 Dose into the skin once a week. 01/04/21   [provider]  PRESCRIPTION MEDICATION Place 4 drops into both ears See admin instructions. Chloramp- sulfu - ampho d-h droc 100-100-5 hydrocortisone, otic ear drops. Instill 4 drops in to each ear daily for the first 5 days of each month    [provider]  potassium chloride SA (KLOR-CON) 20 MEQ tablet Take 1 tablet (20 mEq total) by mouth daily. Patient not taking: No sig reported 08/30/20 09/23/20  [name], MD    Allergies    Baclofen, Dust mite extract, Molds & smuts, Morphine and related, Other, Penicillins, Rofecoxib, Shellfish allergy, Tetracycline hcl, Xolair [omalizumab], Zoledronic acid, Dilaudid [hydromorphone hcl], Levofloxacin, Oxycodone hcl, Paroxetine, Celecoxib, Diltiazem hcl, Hydrocodone-acetaminophen, Lactose intolerance (gi), Morphine sulfate, Oxycodone-acetaminophen, Rituximab, Tree extract, and Penicillin g procaine  Review of Systems   Review of Systems  All other systems reviewed and are negative.  Physical Exam Updated Vital  Signs BP (!) 118/47    Pulse 71    Temp 98.2 F (36.8 C) (Oral)    Resp 17    Ht 5\' 1"  (1.549 m)    Wt 58.1 kg    SpO2 97%    BMI 24.19 kg/m   Physical Exam Vitals and nursing note reviewed.  Constitutional:      General: She is not in acute distress.    Appearance: Normal appearance. She is well-developed.  HENT:     Head: Normocephalic and atraumatic.  Eyes:     Conjunctiva/sclera: Conjunctivae normal.     Pupils: Pupils are equal, round, and reactive to light.  Cardiovascular:     Rate and Rhythm: Normal rate and regular rhythm.     Heart sounds: Normal heart sounds.  Pulmonary:     Effort: Pulmonary effort is normal. No respiratory distress.     Breath sounds: Normal breath sounds.  Abdominal:     General: There is no distension.     Palpations: Abdomen is soft.     Tenderness: There is no abdominal tenderness.  Musculoskeletal:        General: No deformity. Normal range of motion.     Cervical back: Normal range of motion and neck supple.  Skin:  General: Skin is warm and dry.  Neurological:     General: No focal deficit present.     Mental Status: She is alert and oriented to person, place, and time.    ED Results / Procedures / Treatments   Labs (all labs ordered are listed, but only abnormal results are displayed) Labs Reviewed  COMPREHENSIVE METABOLIC PANEL - Abnormal; Notable for the following components:      Result Value   Glucose, Bld 164 (*)    BUN 38 (*)    Creatinine, Ser 1.35 (*)    Calcium 8.5 (*)    Albumin 3.2 (*)    GFR, Estimated 42 (*)    All other components within normal limits  CBC WITH DIFFERENTIAL/PLATELET - Abnormal; Notable for the following components:   WBC 18.0 (*)    RBC 2.00 (*)    Hemoglobin 6.3 (*)    HCT 21.4 (*)    MCV 107.0 (*)    MCHC 29.4 (*)    RDW 18.1 (*)    Neutro Abs 14.9 (*)    Monocytes Absolute 1.8 (*)    Abs Immature Granulocytes 0.15 (*)    All other components within normal limits  PROTIME-INR  POC  OCCULT BLOOD, ED  TYPE AND SCREEN    EKG EKG Interpretation  Date/Time:  Saturday February 04 2021 11:28:37 EST Ventricular Rate:  76 PR Interval:  136 QRS Duration: 83 QT Interval:  390 QTC Calculation: 439 R Axis:   88 Text Interpretation: Sinus rhythm Borderline right axis deviation Anteroseptal infarct, old Borderline repolarization abnormality Confirmed by Dene Gentry [phone]) on 02/04/2021 12:26:58 PM  Radiology No results found.  Procedures Procedures   Medications Ordered in ED Medications - No data to display  ED Course  I have reviewed the triage vital signs and the nursing notes.  Pertinent labs & imaging results that were available during my care of the patient were reviewed by me and considered in my medical decision making (see chart for details).    MDM Rules/Calculators/A&P                         MDM  MSE complete  Ebony Scott was evaluated in Emergency Department on 02/04/2021 for the symptoms described in the history of present illness. She was evaluated in the context of the global COVID-19 pandemic, which necessitated consideration that the patient might be at risk for infection with the SARS-CoV-2 virus that causes COVID-19. Institutional protocols and algorithms that pertain to the evaluation of patients at risk for COVID-19 are in a state of rapid change based on information released by regulatory bodies including the CDC and federal and state organizations. These policies and algorithms were followed during the patient's care in the ED.  Patient is presenting with complaint of decreased hemoglobin with associated weakness.  Patient without clear evidence of acute blood loss.  Patient is reporting darker colored stools approximate 1 week ago.  Suspect possible recent GI bleed.  Patient would benefit from admission and transfusion.  Additional work-up would be warranted.  Hospitalist services aware of case and will evaluate for  same.      Final Clinical Impression(s) / ED Diagnoses Final diagnoses:  Symptomatic anemia    Rx / DC Orders ED Discharge Orders     None        Valarie Merino, MD 02/04/21 1326

## 2021-02-05 ENCOUNTER — Observation Stay (HOSPITAL_COMMUNITY): Payer: Medicare PPO | Admitting: Certified Registered"

## 2021-02-05 ENCOUNTER — Encounter (HOSPITAL_COMMUNITY): Payer: Self-pay | Admitting: Certified Registered"

## 2021-02-05 ENCOUNTER — Encounter (HOSPITAL_COMMUNITY)
Admission: EM | Disposition: A | Discharge status: Home / Self Care | Payer: Self-pay | Source: Home / Self Care | Attending: Internal Medicine

## 2021-02-05 DIAGNOSIS — D649 Anemia, unspecified: Secondary | ICD-10-CM | POA: Diagnosis not present

## 2021-02-05 DIAGNOSIS — K31819 Angiodysplasia of stomach and duodenum without bleeding: Secondary | ICD-10-CM | POA: Diagnosis not present

## 2021-02-05 HISTORY — PX: ARGON LASER APPLICATION: SHX5727

## 2021-02-05 HISTORY — PX: ESOPHAGOGASTRODUODENOSCOPY (EGD) WITH PROPOFOL: SHX5813

## 2021-02-05 LAB — BASIC METABOLIC PANEL
Anion gap: 9 (ref 5–15)
BUN: 30 mg/dL — ABNORMAL HIGH (ref 8–23)
CO2: 27 mmol/L (ref 22–32)
Calcium: 8.3 mg/dL — ABNORMAL LOW (ref 8.9–10.3)
Chloride: 104 mmol/L (ref 98–111)
Creatinine, Ser: 1.24 mg/dL — ABNORMAL HIGH (ref 0.44–1.00)
GFR, Estimated: 46 mL/min — ABNORMAL LOW (ref >60)
Glucose, Bld: 79 mg/dL (ref 70–99)
Potassium: 4.3 mmol/L (ref 3.5–5.1)
Sodium: 140 mmol/L (ref 135–145)

## 2021-02-05 LAB — GLUCOSE, CAPILLARY
Glucose-Capillary: 74 mg/dL (ref 70–99)
Glucose-Capillary: 89 mg/dL (ref 70–99)
Glucose-Capillary: 102 mg/dL — ABNORMAL HIGH (ref 70–99)
Glucose-Capillary: 108 mg/dL — ABNORMAL HIGH (ref 70–99)
Glucose-Capillary: 162 mg/dL — ABNORMAL HIGH (ref 70–99)
Glucose-Capillary: 155 mg/dL — ABNORMAL HIGH (ref 70–99)

## 2021-02-05 LAB — CBC
HCT: 25.9 % — ABNORMAL LOW (ref 36.0–46.0)
Hemoglobin: 7.8 g/dL — ABNORMAL LOW (ref 12.0–15.0)
MCH: 29.9 pg (ref 26.0–34.0)
MCHC: 30.1 g/dL (ref 30.0–36.0)
MCV: 99.2 fL (ref 80.0–100.0)
Platelets: 307 10*3/uL (ref 150–400)
RBC: 2.61 MIL/uL — ABNORMAL LOW (ref 3.87–5.11)
RDW: 21.8 % — ABNORMAL HIGH (ref 11.5–15.5)
WBC: 11.6 10*3/uL — ABNORMAL HIGH (ref 4.0–10.5)
nRBC: 0.3 % — ABNORMAL HIGH (ref 0.0–0.2)

## 2021-02-05 LAB — HEMOGLOBIN A1C
Hgb A1c MFr Bld: 5.5 % (ref 4.8–5.6)
Mean Plasma Glucose: 111.15 mg/dL

## 2021-02-05 LAB — CBG MONITORING, ED: Glucose-Capillary: 81 mg/dL (ref 70–99)

## 2021-02-05 SURGERY — ESOPHAGOGASTRODUODENOSCOPY (EGD) WITH PROPOFOL
Anesthesia: Monitor Anesthesia Care

## 2021-02-05 MED ORDER — LACTATED RINGERS IV SOLN
INTRAVENOUS | Status: AC | PRN
  Administered 2021-02-05: 10 mL/h via INTRAVENOUS

## 2021-02-05 MED ORDER — LIDOCAINE 2% (20 MG/ML) 5 ML SYRINGE
INTRAMUSCULAR | Status: DC | PRN
  Administered 2021-02-05: 40 mg via INTRAVENOUS

## 2021-02-05 MED ORDER — PHENYLEPHRINE HCL (PRESSORS) 10 MG/ML IV SOLN
INTRAVENOUS | Status: AC
  Filled 2021-02-05: qty 1

## 2021-02-05 MED ORDER — PANTOPRAZOLE SODIUM 40 MG PO TBEC
40.0000 mg | DELAYED_RELEASE_TABLET | Freq: Two times a day (BID) | ORAL | Status: DC
  Administered 2021-02-05 – 2021-02-07 (×4): 40 mg via ORAL
  Filled 2021-02-05 (×4): qty 1

## 2021-02-05 MED ORDER — CLOPIDOGREL BISULFATE 75 MG PO TABS
75.0000 mg | ORAL_TABLET | Freq: Every day | ORAL | Status: DC
  Administered 2021-02-05 – 2021-02-07 (×3): 75 mg via ORAL
  Filled 2021-02-05 (×3): qty 1

## 2021-02-05 MED ORDER — PROPOFOL 10 MG/ML IV BOLUS
INTRAVENOUS | Status: DC | PRN
  Administered 2021-02-05 (×3): 20 mg via INTRAVENOUS
  Administered 2021-02-05: 40 mg via INTRAVENOUS
  Administered 2021-02-05 (×7): 20 mg via INTRAVENOUS

## 2021-02-05 MED ORDER — PHENYLEPHRINE 40 MCG/ML (10ML) SYRINGE FOR IV PUSH (FOR BLOOD PRESSURE SUPPORT)
PREFILLED_SYRINGE | INTRAVENOUS | Status: DC | PRN
  Administered 2021-02-05 (×4): 80 ug via INTRAVENOUS

## 2021-02-05 SURGICAL SUPPLY — 15 items

## 2021-02-05 NOTE — Anesthesia Preprocedure Evaluation (Addendum)
Anesthesia Evaluation  Patient identified by MRN, date of birth, ID band Patient awake    Reviewed: Allergy & Precautions, NPO status , Patient's Chart, lab work & pertinent test results  Airway Mallampati: III  TM Distance: >3 FB Neck ROM: Limited  Mouth opening: Limited Mouth Opening Comment: H/o cervical fusion Dental no notable dental hx.    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 Hyperdynamic LV function; grade 1 diastolic dysfunction; moderate LVH; trace AI. 2. Left ventricular ejection fraction, by estimation, is >75%. The left ventricle has hyperdynamic function. The left ventricle has no regional wall motion abnormalities. There is moderate left ventricular hypertrophy. Left ventricular diastolic parameters are consistent with Grade I diastolic dysfunction (impaired relaxation). 3. Right ventricular systolic function is normal. The right ventricular size is normal. 4. The mitral valve is normal in structure. No evidence of mitral valve regurgitation. No evidence of mitral stenosis. 5. The aortic valve has an indeterminant number of cusps. Aortic valve regurgitation is trivial. No aortic stenosis is present.   Neuro/Psych  Headaches, PSYCHIATRIC DISORDERS Anxiety Depression    GI/Hepatic Neg liver ROS, hiatal hernia, GERD  Medicated and Controlled,IBS (irritable bowel syndrome)   Endo/Other  diabetesHypothyroidism   Renal/GU Renal disease     Musculoskeletal  (+) Arthritis  (on prednisone), Rheumatoid disorders,  Fibromyalgia -Gout    Abdominal   Peds  Hematology  (+) anemia ,   Anesthesia Other Findings    Reproductive/Obstetrics                            Anesthesia Physical  Anesthesia Plan  ASA: 4 and emergent  Anesthesia Plan: MAC   Post-op Pain Management:    Induction: Intravenous  PONV Risk Score and Plan: 2 and Propofol infusion, TIVA and Treatment may vary due to age or medical condition  Airway Management Planned: Natural Airway, Nasal Cannula and Simple Face Mask  Additional Equipment:   Intra-op Plan:   Post-operative Plan:   Informed Consent: I have reviewed the patients History and Physical, chart, labs and discussed the procedure including the risks, benefits and alternatives for the proposed anesthesia with the patient or authorized representative who has indicated his/her understanding and acceptance.     Dental advisory given  Plan Discussed with: CRNA, Anesthesiologist and Surgeon  Anesthesia Plan Comments: ( )       Anesthesia Quick Evaluation

## 2021-02-05 NOTE — Progress Notes (Signed)
PROGRESS NOTE    EMOGENE MURATALLA  XVQ:[id number] DOB: 05-22-1950 DOA: 02/04/2021 PCP: [name], MD     Brief Narrative:  Ebony Scott is a 70 y.o. female with medical history significant of right hand abscess, history of DVT and PE on Xarelto, allergic rhinitis, chronic blood loss anemia, angioedema, anxiety/depression, osteoarthritis, mild intermittent asthma, cataracts, C. difficile colitis, diverticulosis, GERD, hiatal hernia, hemorrhoids, IBS, colon polyps, fibromyalgia, headaches, hyperlipidemia, hypothyroid, seronegative rheumatoid arthritis, PVD, CAD with recent drug-eluting stent placement on LAD about 3 weeks ago with addition of aspirin and clopidogrel to her medication regimen who is coming to the emergency department with complaints of progressively worse fatigue and somnolence in the last few days setting of worsening chronic anemia.  She went to see her PCP yesterday who asked the patient to come to the emergency department due to a hemoglobin of 6.5 g/dL. She was given blood transfusion and admitted to the hospital. Aspirin/plavix/xarelto held. GI consulted.   New events last 24 hours / Subjective: Patient states that she is feeling a little bit better since getting blood transfusion.  Admits to having some dry mouth.  Denies any chest pain.  Assessment & Plan:   Principal Problem:   Symptomatic anemia Active Problems:   Hyperlipidemia   GERD   Chronic combined systolic and diastolic CHF (congestive heart failure) (HCC)   Chronic kidney disease, stage 3b (HCC)   Chronic pain syndrome   History of pulmonary embolus (PE)   DMII (diabetes mellitus, type 2) (HCC)   History of DVT (deep vein thrombosis)   Symptomatic anemia with acute on chronic blood loss -Xarelto, aspirin and Plavix has been held -Continue IV PPI -GI consulted, planning for EGD 12/18  CAD with DES -Spoke on the phone with Dr. Sallyanne Kuster, patient had drug-eluting stent placed 01/11/21.  It is crucial  to resume her antiplatelet therapy as soon as possible.  I will follow-up on EGD report and plan to resume Plavix alone this evening  Diabetes mellitus type 2, well controlled  -A1c 5.5  -SSI   Hyperlipidemia -Continue atorvastatin  Chronic combined systolic and diastolic heart failure -Continue Lasix  CKD stage IIIb -Stable  History of PE, DVT -Xarelto on hold  Rheumatoid arthritis -Continue prednisone  DVT prophylaxis:  SCDs Start: 02/04/21 1346  Code Status: Full code Family Communication: No family at bedside Disposition Plan:  Status is: Observation  The patient will require care spanning > 2 midnights and should be moved to inpatient because: Symptomatic anemia, EGD today     Consultants:  GI  Procedures:  EGD 12/18  Antimicrobials:  Anti-infectives (From admission, onward)    None        Objective: Vitals:   02/05/21 0734 02/05/21 0802 02/05/21 0858 02/05/21 1150  BP: (!) 137/57 139/60  (!) 121/54  Pulse: 73 74  77  Resp: 17 17  19   Temp:  98 F (36.7 C)  98 F (36.7 C)  TempSrc:  Oral  Oral  SpO2: 100% 95% 96% 95%  Weight:      Height:        Intake/Output Summary (Last 24 hours) at 02/05/2021 1221 Last data filed at 02/04/2021 1812 Gross per 24 hour  Intake 315 ml  Output --  Net 315 ml   Filed Weights   02/04/21 1053  Weight: 58.1 kg    Examination:  General exam: Appears calm and comfortable  Respiratory system: Clear to auscultation. Respiratory effort normal. No respiratory distress. No conversational  dyspnea.  Cardiovascular system: S1 & S2 heard, RRR. No murmurs. No pedal edema. Gastrointestinal system: Abdomen is nondistended, soft and nontender. Normal bowel sounds heard. Central nervous system: Alert and oriented. No focal neurological deficits. Speech clear.  Extremities: Symmetric in appearance  Skin: No rashes, lesions or ulcers on exposed skin  Psychiatry: Judgement and insight appear normal. Mood & affect  appropriate.   Data Reviewed: I have personally reviewed following labs and imaging studies  CBC: Recent Labs  Lab 02/03/21 0817 02/04/21 1150 02/04/21 2105 02/05/21 0430  WBC 11.2* 18.0*  --  11.6*  NEUTROABS  --  14.9*  --   --   HGB 6.4* 6.3* 7.3* 7.8*  HCT 20.6* 21.4* 23.3* 25.9*  MCV 102* 107.0*  --  99.2  PLT 306 317  --  476   Basic Metabolic Panel: Recent Labs  Lab 02/03/21 0817 02/04/21 1150 02/05/21 0430  NA 141 137 140  K 4.4 3.7 4.3  CL 102 102 104  CO2 20 26 27   GLUCOSE 123* 164* 79  BUN 34* 38* 30*  CREATININE 1.14* 1.35* 1.24*  CALCIUM 8.7 8.5* 8.3*   GFR: Estimated Creatinine Clearance: 33.6 mL/min (A) (by C-G formula based on SCr of 1.24 mg/dL (H)). Liver Function Tests: Recent Labs  Lab 02/04/21 1150  AST 30  ALT 33  ALKPHOS 66  BILITOT 0.6  PROT 6.5  ALBUMIN 3.2*   No results for input(s): LIPASE, AMYLASE in the last 168 hours. No results for input(s): AMMONIA in the last 168 hours. Coagulation Profile: Recent Labs  Lab 02/04/21 1150  INR 1.1   Cardiac Enzymes: No results for input(s): CKTOTAL, CKMB, CKMBINDEX, TROPONINI in the last 168 hours. BNP (last 3 results) No results for input(s): PROBNP in the last 8760 hours. HbA1C: Recent Labs    02/05/21 0430  HGBA1C 5.5   CBG: Recent Labs  Lab 02/05/21 0256 02/05/21 0759 02/05/21 1135  GLUCAP 81 74 89   Lipid Profile: No results for input(s): CHOL, HDL, LDLCALC, TRIG, CHOLHDL, LDLDIRECT in the last 72 hours. Thyroid Function Tests: No results for input(s): TSH, T4TOTAL, FREET4, T3FREE, THYROIDAB in the last 72 hours. Anemia Panel: No results for input(s): VITAMINB12, FOLATE, FERRITIN, TIBC, IRON, RETICCTPCT in the last 72 hours. Sepsis Labs: No results for input(s): PROCALCITON, LATICACIDVEN in the last 168 hours.  Recent Results (from the past 240 hour(s))  Resp Panel by RT-PCR (Flu A&B, Covid) Nasopharyngeal Swab     Status: None   Collection Time: 02/04/21  1:50 PM    Specimen: Nasopharyngeal Swab; Nasopharyngeal(NP) swabs in vial transport medium  Result Value Ref Range Status   SARS Coronavirus 2 by RT PCR NEGATIVE NEGATIVE Final    Comment: (NOTE) SARS-CoV-2 target nucleic acids are NOT DETECTED.  The SARS-CoV-2 RNA is generally detectable in upper respiratory specimens during the acute phase of infection. The lowest concentration of SARS-CoV-2 viral copies this assay can detect is 138 copies/mL. A negative result does not preclude SARS-Cov-2 infection and should not be used as the sole basis for treatment or other patient management decisions. A negative result may occur with  improper specimen collection/handling, submission of specimen other than nasopharyngeal swab, presence of viral mutation(s) within the areas targeted by this assay, and inadequate number of viral copies(<138 copies/mL). A negative result must be combined with clinical observations, patient history, and epidemiological information. The expected result is Negative.  Fact Sheet for Patients:  EntrepreneurPulse.com.au  Fact Sheet for Healthcare Providers:  IncredibleEmployment.be  This  test is no t yet approved or cleared by the [place] and  has been authorized for detection and/or diagnosis of SARS-CoV-2 by FDA under an Emergency Use Authorization (EUA). This EUA will remain  in effect (meaning this test can be used) for the duration of the COVID-19 declaration under Section 564(b)(1) of the Act, 21 U.S.C.section 360bbb-3(b)(1), unless the authorization is terminated  or revoked sooner.       Influenza A by PCR NEGATIVE NEGATIVE Final   Influenza B by PCR NEGATIVE NEGATIVE Final    Comment: (NOTE) The Xpert Xpress SARS-CoV-2/FLU/RSV plus assay is intended as an aid in the diagnosis of influenza from Nasopharyngeal swab specimens and should not be used as a sole basis for treatment. Nasal washings and aspirates are  unacceptable for Xpert Xpress SARS-CoV-2/FLU/RSV testing.  Fact Sheet for Patients: EntrepreneurPulse.com.au  Fact Sheet for Healthcare Providers: IncredibleEmployment.be  This test is not yet approved or cleared by the [place] FDA and has been authorized for detection and/or diagnosis of SARS-CoV-2 by FDA under an Emergency Use Authorization (EUA). This EUA will remain in effect (meaning this test can be used) for the duration of the COVID-19 declaration under Section 564(b)(1) of the Act, 21 U.S.C. section 360bbb-3(b)(1), unless the authorization is terminated or revoked.  Performed at [facility], [place] [address] [place], [place] [address]       Radiology Studies: DG Chest Port 1 View  Result Date: 02/04/2021 CLINICAL DATA:  Per chart: Patient reports she had blood work at [employer] office yesterday, pcp office called today and told patient to come to ED due to hemoglobin being 6.5. Hx of asthma, DVT EXAM: PORTABLE CHEST 1 VIEW COMPARISON:  10/06/2020 and older studies. FINDINGS: Cardiac silhouette mildly enlarged.  No mediastinal or hilar masses. Clear lungs.  No convincing pleural effusion.  No pneumothorax. Stable right reverse shoulder arthroplasty. IMPRESSION: No acute cardiopulmonary disease. Electronically Signed   By: Lajean Manes M.D.   On: 02/04/2021 13:39      Scheduled Meds:  allopurinol  100 mg Oral Daily   arformoterol  15 mcg Nebulization BID   And   umeclidinium bromide  1 puff Inhalation Daily   atorvastatin  80 mg Oral Daily   empagliflozin  10 mg Oral QAC breakfast   famotidine  20 mg Oral QHS   folic acid  1 mg Oral Daily   furosemide  40 mg Oral Daily   glipiZIDE  5 mg Oral Q breakfast   insulin aspart  0-9 Units Subcutaneous TID WC   loratadine  10 mg Oral Daily   magnesium oxide  400 mg Oral QPM   montelukast  10 mg Oral QPM   pantoprazole (PROTONIX) IV  40 mg Intravenous Q12H   polyvinyl  alcohol  1 drop Both Eyes BID   predniSONE  8 mg Oral Q breakfast   Continuous Infusions:   LOS: 0 days      Time spent: 25 minutes   Dessa Phi, DO [facility] 02/05/2021, 12:21 PM   Available via Epic secure chat 7am-7pm After these hours, please refer to coverage provider listed on [email or web address]

## 2021-02-05 NOTE — Interval H&P Note (Signed)
History and Physical Interval Note: No interval changes overnight. Hgb responded to transfusion yesterday. No obvious bleeding. Last dose of Xarelto and Plavix was 2 days ago. We discussed endoscopy and risks of it along with anesthesia, she understands and wishes to proceed. Hopefully we can clarify source and treat, if no source on this exam will discuss options with her.   02/05/2021 2:35 PM  Ebony Scott  has presented today for surgery, with the diagnosis of anemia, dark stools.  The various methods of treatment have been discussed with the patient and family. After consideration of risks, benefits and other options for treatment, the patient has consented to  Procedure(s): ESOPHAGOGASTRODUODENOSCOPY (EGD) WITH PROPOFOL (N/A) as a surgical intervention.  The patient's history has been reviewed, patient examined, no change in status, stable for surgery.  I have reviewed the patient's chart and labs.  Questions were answered to the patient's satisfaction.     [place]

## 2021-02-05 NOTE — Progress Notes (Signed)
Discussed current complicated clinical situation with Dr. Maylene Roes. Chronically ill patient with multiple comorbid conditions, remote DVT/PE and recent stent to proximal LAD 01/11/2021 (elective PCI due to HFrEF with regional wall motion abnormalities, not during ACS). After her presentation with symptomatic worsening anemia yesterday, all antiplatelet and anticoagulant meds were held. Last clopidogrel dose was 12/17. Currently without overt HF exacerbation.  Plan for EGD at 1400h today, followed by colonoscopy if no problem is identified on EGD. Barring identification of active bleeding on EGD, would recommend restarting the clopidogrel, without aspirin, due to high risk of acute stent thrombosis so soon after device implantation. Continue to hold the xarelto. Gladly offered full Cardiology consultation if/when the primary service believes this is necessary.

## 2021-02-05 NOTE — Progress Notes (Signed)
Oriented to room and call bell. Bed low call bell with in reach.

## 2021-02-05 NOTE — Progress Notes (Signed)
Pt refusing CPAP QHS.  Pt states she only wants to wear Gail.

## 2021-02-05 NOTE — Progress Notes (Signed)
Patient admitted to 1516. No c/o pain or discomfort. alert and oriented x4.

## 2021-02-05 NOTE — Op Note (Signed)
[facility] Patient Name: Ebony Scott Procedure Date: 02/05/2021 MRN: [id number] Attending MD: Carlota Raspberry. Kalliope Riesen , MD Date of Birth: 02/26/1950 CSN: [id number] Age: 70 Admit Type: Inpatient Procedure:                Upper GI endoscopy Indications:              anemia suspected to be due to GI tract loss (dark                            stools) when aspirin and Plavix were added to                            baseline regimen of Xarelto for recent stenting to                            LAD 01/11/21. Colonoscopy normal in 2018. Providers:                Carlota Raspberry. Havery Moros, MD, Burtis Junes, RN, Ladona Ridgel, Technician, Glenis Smoker, CRNA Referring MD:              Medicines:                Monitored Anesthesia Care Complications:            No immediate complications. Estimated blood loss:                            Minimal. Estimated Blood Loss:     Estimated blood loss was minimal. Procedure:                Pre-Anesthesia Assessment:                           - Prior to the procedure, a History and Physical                            was performed, and patient medications and                            allergies were reviewed. The patient's tolerance of                            previous anesthesia was also reviewed. The risks                            and benefits of the procedure and the sedation                            options and risks were discussed with the patient.                            All questions were answered, and informed consent  was obtained. Prior Anticoagulants: The patient has                            taken Xarelto (rivaroxaban), last dose was 2 days                            prior to procedure. ASA Grade Assessment: IV - A                            patient with severe systemic disease that is a                            constant threat to life. After reviewing the risks                             and benefits, the patient was deemed in                            satisfactory condition to undergo the procedure.                           After obtaining informed consent, the endoscope was                            passed under direct vision. Throughout the                            procedure, the patient's blood pressure, pulse, and                            oxygen saturations were monitored continuously. The                            [id number]-[id number] ([id number]) Olympus endoscope was introduced                            through the mouth, and advanced to the second part                            of duodenum. The upper GI endoscopy was                            accomplished without difficulty. The patient                            tolerated the procedure well. Scope In: Scope Out: Findings:      The Z-line was regular.      The exam of the esophagus was otherwise normal.      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  The exam of the stomach was otherwise normal.      The duodenal bulb and second portion of the duodenum were normal. Impression:               - Z-line regular.                           - Normal esophagus otherwise                           - Striped erythema of the antrum, which I suspect                            represents mild gastric antral vascular ectasia. No                            overt bleeding noted but could be source in the                            setting of Xarelto / Plavix. Treated with argon                            plasma coagulation (APC).                           - Normal duodenal bulb and second portion of the                            duodenum. Moderate Sedation:      No moderate sedation, case performed with  MAC Recommendation:           - Return patient to hospital ward for ongoing care.                           - Clear liquid diet okay.                           - Continue present medications.                           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 If anemia recurs or continued black stools,                            patient will need colonoscopy Procedure Code(s):        --- Professional ---                           [phone], Esophagogastroduodenoscopy, flexible,                            transoral; with control of bleeding, any method Diagnosis Code(s):        --- Professional ---                           K31.819, Angiodysplasia of stomach and duodenum                            without bleeding                           D50.0, Iron deficiency anemia secondary to blood                            loss (chronic) CPT copyright 2019 American Medical Association. All rights reserved. The codes documented in this report are preliminary and upon coder review may  be revised to meet current compliance requirements. Remo Lipps P. Yulonda Wheeling, MD 02/05/2021 3:16:01 PM This report has been signed electronically. Number of Addenda: 0

## 2021-02-05 NOTE — Anesthesia Procedure Notes (Signed)
Procedure Name: MAC Date/Time: 02/05/2021 2:36 PM Performed by: Cynda Familia, CRNA Pre-anesthesia Checklist: Patient identified, Emergency Drugs available, Suction available, Patient being monitored and Timeout performed Patient Re-evaluated:Patient Re-evaluated prior to induction Oxygen Delivery Method: Simple face mask Placement Confirmation: positive ETCO2 and breath sounds checked- equal and bilateral Dental Injury: Teeth and Oropharynx as per pre-operative assessment

## 2021-02-05 NOTE — Transfer of Care (Signed)
Immediate Anesthesia Transfer of Care Note  Patient: [name]  Procedure(s) Performed: ESOPHAGOGASTRODUODENOSCOPY (EGD) WITH PROPOFOL ARGON LASER APPLICATION  Patient Location: PACU in endo  Anesthesia Type:MAC  Level of Consciousness: sedated  Airway & Oxygen Therapy: Patient Spontanous Breathing and Patient connected to face mask oxygen  Post-op Assessment: Report given to RN and Post -op Vital signs reviewed and stable  Post vital signs: Reviewed and stable  Last Vitals:  Vitals Value Taken Time  BP 104/58 02/05/21 1506  Temp    Pulse 74 02/05/21 1507  Resp 33 02/05/21 1507  SpO2 100 % 02/05/21 1507  Vitals shown include unvalidated device data.  Last Pain:  Vitals:   02/05/21 1416  TempSrc: Oral  PainSc: 5          Complications: No notable events documented.

## 2021-02-05 NOTE — Anesthesia Postprocedure Evaluation (Signed)
Anesthesia Post Note  Patient: Ebony Scott  Procedure(s) Performed: ESOPHAGOGASTRODUODENOSCOPY (EGD) WITH PROPOFOL ARGON LASER APPLICATION     Patient location during evaluation: PACU Anesthesia Type: MAC Level of consciousness: awake and alert Pain management: pain level controlled Vital Signs Assessment: post-procedure vital signs reviewed and stable Respiratory status: spontaneous breathing and respiratory function stable Cardiovascular status: stable Postop Assessment: no apparent nausea or vomiting Anesthetic complications: no   No notable events documented.  Last Vitals:  Vitals:   02/05/21 1507 02/05/21 1510  BP: (!) 104/58 (!) 110/50  Pulse: 75 71  Resp: (!) 28 (!) 28  Temp: 36.6 C   SpO2: 100% 99%    Last Pain:  Vitals:   02/05/21 1507  TempSrc: Oral  PainSc:                  Merlinda Frederick

## 2021-02-06 ENCOUNTER — Encounter (HOSPITAL_COMMUNITY): Payer: Self-pay | Admitting: Gastroenterology

## 2021-02-06 DIAGNOSIS — N1832 Chronic kidney disease, stage 3b: Secondary | ICD-10-CM | POA: Diagnosis present

## 2021-02-06 DIAGNOSIS — Z86718 Personal history of other venous thrombosis and embolism: Secondary | ICD-10-CM | POA: Diagnosis not present

## 2021-02-06 DIAGNOSIS — E1151 Type 2 diabetes mellitus with diabetic peripheral angiopathy without gangrene: Secondary | ICD-10-CM | POA: Diagnosis present

## 2021-02-06 DIAGNOSIS — J4540 Moderate persistent asthma, uncomplicated: Secondary | ICD-10-CM | POA: Diagnosis present

## 2021-02-06 DIAGNOSIS — G894 Chronic pain syndrome: Secondary | ICD-10-CM | POA: Diagnosis present

## 2021-02-06 DIAGNOSIS — E0500 Thyrotoxicosis with diffuse goiter without thyrotoxic crisis or storm: Secondary | ICD-10-CM | POA: Diagnosis present

## 2021-02-06 DIAGNOSIS — K5791 Diverticulosis of intestine, part unspecified, without perforation or abscess with bleeding: Secondary | ICD-10-CM | POA: Diagnosis present

## 2021-02-06 DIAGNOSIS — Z955 Presence of coronary angioplasty implant and graft: Secondary | ICD-10-CM | POA: Diagnosis not present

## 2021-02-06 DIAGNOSIS — E1122 Type 2 diabetes mellitus with diabetic chronic kidney disease: Secondary | ICD-10-CM | POA: Diagnosis present

## 2021-02-06 DIAGNOSIS — I2510 Atherosclerotic heart disease of native coronary artery without angina pectoris: Secondary | ICD-10-CM | POA: Diagnosis present

## 2021-02-06 DIAGNOSIS — E249 Cushing's syndrome, unspecified: Secondary | ICD-10-CM | POA: Diagnosis present

## 2021-02-06 DIAGNOSIS — I5042 Chronic combined systolic (congestive) and diastolic (congestive) heart failure: Secondary | ICD-10-CM | POA: Diagnosis present

## 2021-02-06 DIAGNOSIS — D649 Anemia, unspecified: Secondary | ICD-10-CM | POA: Diagnosis present

## 2021-02-06 DIAGNOSIS — Z888 Allergy status to other drugs, medicaments and biological substances status: Secondary | ICD-10-CM | POA: Diagnosis not present

## 2021-02-06 DIAGNOSIS — E039 Hypothyroidism, unspecified: Secondary | ICD-10-CM | POA: Diagnosis present

## 2021-02-06 DIAGNOSIS — Z20822 Contact with and (suspected) exposure to covid-19: Secondary | ICD-10-CM | POA: Diagnosis present

## 2021-02-06 DIAGNOSIS — Z86711 Personal history of pulmonary embolism: Secondary | ICD-10-CM | POA: Diagnosis not present

## 2021-02-06 DIAGNOSIS — M0600 Rheumatoid arthritis without rheumatoid factor, unspecified site: Secondary | ICD-10-CM | POA: Diagnosis present

## 2021-02-06 DIAGNOSIS — F419 Anxiety disorder, unspecified: Secondary | ICD-10-CM | POA: Diagnosis present

## 2021-02-06 DIAGNOSIS — Z91048 Other nonmedicinal substance allergy status: Secondary | ICD-10-CM | POA: Diagnosis not present

## 2021-02-06 DIAGNOSIS — K31811 Angiodysplasia of stomach and duodenum with bleeding: Secondary | ICD-10-CM | POA: Diagnosis present

## 2021-02-06 DIAGNOSIS — D62 Acute posthemorrhagic anemia: Secondary | ICD-10-CM | POA: Diagnosis present

## 2021-02-06 DIAGNOSIS — F32A Depression, unspecified: Secondary | ICD-10-CM | POA: Diagnosis present

## 2021-02-06 DIAGNOSIS — K219 Gastro-esophageal reflux disease without esophagitis: Secondary | ICD-10-CM | POA: Diagnosis present

## 2021-02-06 DIAGNOSIS — M797 Fibromyalgia: Secondary | ICD-10-CM | POA: Diagnosis present

## 2021-02-06 LAB — TYPE AND SCREEN
ABO/RH(D): A POS
Antibody Screen: NEGATIVE
Unit division: 0

## 2021-02-06 LAB — BASIC METABOLIC PANEL
Anion gap: 9 (ref 5–15)
BUN: 28 mg/dL — ABNORMAL HIGH (ref 8–23)
CO2: 28 mmol/L (ref 22–32)
Calcium: 8.5 mg/dL — ABNORMAL LOW (ref 8.9–10.3)
Chloride: 103 mmol/L (ref 98–111)
Creatinine, Ser: 1.43 mg/dL — ABNORMAL HIGH (ref 0.44–1.00)
GFR, Estimated: 39 mL/min — ABNORMAL LOW (ref >60)
Glucose, Bld: 94 mg/dL (ref 70–99)
Potassium: 3.9 mmol/L (ref 3.5–5.1)
Sodium: 140 mmol/L (ref 135–145)

## 2021-02-06 LAB — GLUCOSE, CAPILLARY
Glucose-Capillary: 95 mg/dL (ref 70–99)
Glucose-Capillary: 165 mg/dL — ABNORMAL HIGH (ref 70–99)
Glucose-Capillary: 156 mg/dL — ABNORMAL HIGH (ref 70–99)
Glucose-Capillary: 206 mg/dL — ABNORMAL HIGH (ref 70–99)

## 2021-02-06 LAB — BPAM RBC
Blood Product Expiration Date: 202301152359
ISSUE DATE / TIME: 202212171517
Unit Type and Rh: 6200

## 2021-02-06 LAB — OCCULT BLOOD, POC DEVICE: Fecal Occult Bld: POSITIVE — AB

## 2021-02-06 LAB — IRON AND TIBC
Iron: 14 ug/dL — ABNORMAL LOW (ref 28–170)
Saturation Ratios: 3 % — ABNORMAL LOW (ref 10.4–31.8)
TIBC: 417 ug/dL (ref 250–450)
UIBC: 403 ug/dL

## 2021-02-06 LAB — CBC
HCT: 27.9 % — ABNORMAL LOW (ref 36.0–46.0)
Hemoglobin: 8.5 g/dL — ABNORMAL LOW (ref 12.0–15.0)
MCH: 29.9 pg (ref 26.0–34.0)
MCHC: 30.5 g/dL (ref 30.0–36.0)
MCV: 98.2 fL (ref 80.0–100.0)
Platelets: 334 10*3/uL (ref 150–400)
RBC: 2.84 MIL/uL — ABNORMAL LOW (ref 3.87–5.11)
RDW: 20.7 % — ABNORMAL HIGH (ref 11.5–15.5)
WBC: 17.2 10*3/uL — ABNORMAL HIGH (ref 4.0–10.5)
nRBC: 0 % (ref 0.0–0.2)

## 2021-02-06 NOTE — Progress Notes (Signed)
Progress Note   Subjective  Chief Complaint: Anemia  Patient status post endoscopy with Dr. 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  She has not had any more dark or black stools She did have a hard stool yesterday evening, has been complaining of constipation for last month. Did have some rectal pain with the difficult bowel movement, bright red blood on toilet paper only small volume. Patient has had some mild nausea and states she is at baseline for her shortness of breath. Denies fever, chills, abdominal pain, vomiting.    Objective   Vital signs in last 24 hours: Temp:  [97.8 F (36.6 C)-98.9 F (37.2 C)] 98.6 F (37 C) (12/19 0512) Pulse Rate:  [67-77] 73 (12/19 0512) Resp:  [12-28] 16 (12/19 0512) BP: (104-151)/(47-76) 125/55 (12/19 0512) SpO2:  [93 %-100 %] 99 % (12/19 0802) FiO2 (%):  [28 %] 28 % (12/19 0802) Weight:  [53.1 kg] 53.1 kg (12/18 1416)    General:  cushingoid appearing female in no acute distress  Heart:  regular rate and rhythm, no murmurs or gallops Pulm: Clear anteriorly; no wheezing Abdomen:  Soft, Obese AB, Normal bowel sounds. mild tenderness in the LLQ. Without guarding and Without rebound, without hepatomegaly. Extremities:  Without edema. Neurologic:  Alert and  oriented x4;  grossly normal neurologically.  Skin:   Dry and intact without significant lesions or rashes. Psychiatric: Cooperative. Normal mood and affect.  Intake/Output from previous day: 12/18 0701 - 12/19 0700 In: 600 [P.O.:600] Out: 1400 [Urine:1400] Intake/Output this shift: No intake/output data recorded.  Lab Results: Recent Labs    02/04/21 1150 02/04/21 2105 02/05/21 0430 02/06/21 0451  WBC 18.0*  --  11.6* 17.2*  HGB 6.3* 7.3* 7.8* 8.5*  HCT 21.4* 23.3* 25.9* 27.9*  PLT 317  --  307 334   BMET Recent Labs    02/04/21 1150 02/05/21 0430 02/06/21 0451  NA 137 140 140  K 3.7 4.3 3.9  CL 102 104 103  CO2 26 27 28   GLUCOSE 164* 79 94   BUN 38* 30* 28*  CREATININE 1.35* 1.24* 1.43*  CALCIUM 8.5* 8.3* 8.5*   LFT Recent Labs    02/04/21 1150  PROT 6.5  ALBUMIN 3.2*  AST 30  ALT 33  ALKPHOS 66  BILITOT 0.6   PT/INR Recent Labs    02/04/21 1150  LABPROT 14.1  INR 1.1    Studies/Results: DG Chest Port 1 View  Result Date: 02/04/2021 CLINICAL DATA:  Per chart: Patient reports she had blood work at [employer] office yesterday, pcp office called today and told patient to come to ED due to hemoglobin being 6.5. Hx of asthma, DVT EXAM: PORTABLE CHEST 1 VIEW COMPARISON:  10/06/2020 and older studies. FINDINGS: Cardiac silhouette mildly enlarged.  No mediastinal or hilar masses. Clear lungs.  No convincing pleural effusion.  No pneumothorax. Stable right reverse shoulder arthroplasty. IMPRESSION: No acute cardiopulmonary disease. Electronically Signed   By: Lajean Manes M.D.   On: 02/04/2021 13:39       Impression    Anemia progressive over 4 months after addition of plavix to chronic xarelto after cardiac stenting last month, having melena, last colon 2018 due 03/2021 Dr. Carlean Purl S/p 1 PRBC on 02/04/2021. 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 Has had hard stool yesterday evening, did have bright red blood low-volume just on toilet paper with  some rectal pain.  States has history of hemorrhoids.    CAD s/p stent to LAD on 01/11/2021.   Low-dose aspirin and Plavix added to chronic Xarelto chronically in use for history DVT.   First dose of Plavix 02/05/2021 Aspirin and Xarelto currently on hold.   Seronegative rheumatoid arthritis.   Failed to respond to or lost response to multiple DMARDs.   Now on Prednisone 10 mg daily.      Plan   -get iron/TIBC to see if patient would benefit from IV iron -Patient without any further bleeding as evident by improving hemoglobin. -Did have small volume right  red blood on toilet after hard stool, patient is due for colonoscopy 03/2021 - she is back on plavix with recent stent to LAD - will plan for close outpatient follow up and outpatient colonoscopy timing pending on cardiology and anticoagulation.  -Add on Colace 100 mg twice daily -MiraLAX as needed - can continue PPI    LOS: 0 days   Vladimir Crofts  02/06/2021, 9:23 AM

## 2021-02-06 NOTE — Progress Notes (Signed)
Pt refuses CPAP QHS, pt prefers O2 via [place] instead.

## 2021-02-06 NOTE — Progress Notes (Signed)
PROGRESS NOTE    Ebony Scott  EYC:[id number] DOB: May 15, 1950 DOA: 02/04/2021 PCP: [name], MD     Brief Narrative:  Ebony Scott is a 70 y.o. female with medical history significant of right hand abscess, history of DVT and PE on Xarelto, allergic rhinitis, chronic blood loss anemia, angioedema, anxiety/depression, osteoarthritis, mild intermittent asthma, cataracts, C. difficile colitis, diverticulosis, GERD, hiatal hernia, hemorrhoids, IBS, colon polyps, fibromyalgia, headaches, hyperlipidemia, hypothyroid, seronegative rheumatoid arthritis, PVD, CAD with recent drug-eluting stent placement on LAD about 3 weeks ago with addition of aspirin and clopidogrel to her medication regimen who is coming to the emergency department with complaints of progressively worse fatigue and somnolence in the last few days setting of worsening chronic anemia.  She went to see her PCP yesterday who asked the patient to come to the emergency department due to a hemoglobin of 6.5 g/dL. She was given blood transfusion and admitted to the hospital. Aspirin/plavix/xarelto held. GI consulted.  Patient underwent EGD 12/18 which revealed gastric antral vascular ectasia, treated with APC.  She was resumed on Plavix.  New events last 24 hours / Subjective: Admits to some nausea without vomiting.  Hoping to be able to advance diet today, remains on clears for now.  Had a bowel movement last night.  Assessment & Plan:   Principal Problem:   Symptomatic anemia Active Problems:   Hyperlipidemia   GERD   Chronic combined systolic and diastolic CHF (congestive heart failure) (HCC)   Chronic kidney disease, stage 3b (HCC)   Chronic pain syndrome   History of pulmonary embolus (PE)   DMII (diabetes mellitus, type 2) (HCC)   History of DVT (deep vein thrombosis)   GAVE (gastric antral vascular ectasia)   Symptomatic anemia with acute on chronic blood loss, secondary to GAVE -Xarelto, aspirin on hold.  Plavix  resumed. -Continue PPI -GI consulted, status post EGD 12/18, showed GAVE s/p APC  -Hgb stable today   CAD with DES -Spoke on the phone with Dr. Sallyanne Kuster, patient had drug-eluting stent placed 01/11/21.  It is crucial to resume her antiplatelet therapy as soon as possible.  Plavix is resumed  Diabetes mellitus type 2, well controlled  -A1c 5.5  -SSI   Hyperlipidemia -Continue atorvastatin  Chronic combined systolic and diastolic heart failure -Continue Lasix  CKD stage IIIb -Stable  History of PE, DVT -Xarelto on hold  Rheumatoid arthritis -Continue prednisone  DVT prophylaxis:  SCDs Start: 02/04/21 1346  Code Status: Full code Family Communication: No family at bedside Disposition Plan:  Status is: Observation  The patient will require care spanning > 2 midnights and should be moved to inpatient because: Symptomatic anemia, close monitoring s/p EGD, back on plavix, continued nausea, on clear liquid diet     Consultants:  GI  Procedures:  EGD 12/18  Antimicrobials:  Anti-infectives (From admission, onward)    None        Objective: Vitals:   02/05/21 2021 02/06/21 0512 02/06/21 0800 02/06/21 0802  BP:  (!) 125/55    Pulse:  73    Resp:  16    Temp:  98.6 F (37 C)    TempSrc:  Oral    SpO2: 98% 99% 97% 99%  Weight:      Height:        Intake/Output Summary (Last 24 hours) at 02/06/2021 1030 Last data filed at 02/06/2021 0600 Gross per 24 hour  Intake 600 ml  Output 1400 ml  Net -800 ml  Filed Weights   02/04/21 1053 02/05/21 1416  Weight: 58.1 kg 53.1 kg    Examination: General exam: Appears calm and comfortable  Respiratory system: Clear to auscultation. Respiratory effort normal. Cardiovascular system: S1 & S2 heard, RRR. No pedal edema. Gastrointestinal system: Abdomen is nondistended, soft and nontender. Normal bowel sounds heard. Central nervous system: Alert and oriented. Non focal exam. Speech clear  Extremities: Symmetric  in appearance bilaterally  Skin: No rashes, lesions or ulcers on exposed skin  Psychiatry: Judgement and insight appear stable. Mood & affect appropriate.     Data Reviewed: I have personally reviewed following labs and imaging studies  CBC: Recent Labs  Lab 02/03/21 0817 02/04/21 1150 02/04/21 2105 02/05/21 0430 02/06/21 0451  WBC 11.2* 18.0*  --  11.6* 17.2*  NEUTROABS  --  14.9*  --   --   --   HGB 6.4* 6.3* 7.3* 7.8* 8.5*  HCT 20.6* 21.4* 23.3* 25.9* 27.9*  MCV 102* 107.0*  --  99.2 98.2  PLT 306 317  --  307 768   Basic Metabolic Panel: Recent Labs  Lab 02/03/21 0817 02/04/21 1150 02/05/21 0430 02/06/21 0451  NA 141 137 140 140  K 4.4 3.7 4.3 3.9  CL 102 102 104 103  CO2 20 26 27 28   GLUCOSE 123* 164* 79 94  BUN 34* 38* 30* 28*  CREATININE 1.14* 1.35* 1.24* 1.43*  CALCIUM 8.7 8.5* 8.3* 8.5*   GFR: Estimated Creatinine Clearance: 26.8 mL/min (A) (by C-G formula based on SCr of 1.43 mg/dL (H)). Liver Function Tests: Recent Labs  Lab 02/04/21 1150  AST 30  ALT 33  ALKPHOS 66  BILITOT 0.6  PROT 6.5  ALBUMIN 3.2*   No results for input(s): LIPASE, AMYLASE in the last 168 hours. No results for input(s): AMMONIA in the last 168 hours. Coagulation Profile: Recent Labs  Lab 02/04/21 1150  INR 1.1   Cardiac Enzymes: No results for input(s): CKTOTAL, CKMB, CKMBINDEX, TROPONINI in the last 168 hours. BNP (last 3 results) No results for input(s): PROBNP in the last 8760 hours. HbA1C: Recent Labs    02/05/21 0430  HGBA1C 5.5   CBG: Recent Labs  Lab 02/05/21 1431 02/05/21 1518 02/05/21 1643 02/05/21 2128 02/06/21 0727  GLUCAP 102* 108* 162* 155* 95   Lipid Profile: No results for input(s): CHOL, HDL, LDLCALC, TRIG, CHOLHDL, LDLDIRECT in the last 72 hours. Thyroid Function Tests: No results for input(s): TSH, T4TOTAL, FREET4, T3FREE, THYROIDAB in the last 72 hours. Anemia Panel: No results for input(s): VITAMINB12, FOLATE, FERRITIN, TIBC,  IRON, RETICCTPCT in the last 72 hours. Sepsis Labs: No results for input(s): PROCALCITON, LATICACIDVEN in the last 168 hours.  Recent Results (from the past 240 hour(s))  Resp Panel by RT-PCR (Flu A&B, Covid) Nasopharyngeal Swab     Status: None   Collection Time: 02/04/21  1:50 PM   Specimen: Nasopharyngeal Swab; Nasopharyngeal(NP) swabs in vial transport medium  Result Value Ref Range Status   SARS Coronavirus 2 by RT PCR NEGATIVE NEGATIVE Final    Comment: (NOTE) SARS-CoV-2 target nucleic acids are NOT DETECTED.  The SARS-CoV-2 RNA is generally detectable in upper respiratory specimens during the acute phase of infection. The lowest concentration of SARS-CoV-2 viral copies this assay can detect is 138 copies/mL. A negative result does not preclude SARS-Cov-2 infection and should not be used as the sole basis for treatment or other patient management decisions. A negative result may occur with  improper specimen collection/handling, submission of specimen other  than nasopharyngeal swab, presence of viral mutation(s) within the areas targeted by this assay, and inadequate number of viral copies(<138 copies/mL). A negative result must be combined with clinical observations, patient history, and epidemiological information. The expected result is Negative.  Fact Sheet for Patients:  EntrepreneurPulse.com.au  Fact Sheet for Healthcare Providers:  IncredibleEmployment.be  This test is no t yet approved or cleared by the [place] FDA and  has been authorized for detection and/or diagnosis of SARS-CoV-2 by FDA under an Emergency Use Authorization (EUA). This EUA will remain  in effect (meaning this test can be used) for the duration of the COVID-19 declaration under Section 564(b)(1) of the Act, 21 U.S.C.section 360bbb-3(b)(1), unless the authorization is terminated  or revoked sooner.       Influenza A by PCR NEGATIVE NEGATIVE Final    Influenza B by PCR NEGATIVE NEGATIVE Final    Comment: (NOTE) The Xpert Xpress SARS-CoV-2/FLU/RSV plus assay is intended as an aid in the diagnosis of influenza from Nasopharyngeal swab specimens and should not be used as a sole basis for treatment. Nasal washings and aspirates are unacceptable for Xpert Xpress SARS-CoV-2/FLU/RSV testing.  Fact Sheet for Patients: EntrepreneurPulse.com.au  Fact Sheet for Healthcare Providers: IncredibleEmployment.be  This test is not yet approved or cleared by the [place] FDA and has been authorized for detection and/or diagnosis of SARS-CoV-2 by FDA under an Emergency Use Authorization (EUA). This EUA will remain in effect (meaning this test can be used) for the duration of the COVID-19 declaration under Section 564(b)(1) of the Act, 21 U.S.C. section 360bbb-3(b)(1), unless the authorization is terminated or revoked.  Performed at [facility], [place] [address], [place], [place] [address]       Radiology Studies: DG Chest Port 1 View  Result Date: 02/04/2021 CLINICAL DATA:  Per chart: Patient reports she had blood work at [employer] office yesterday, pcp office called today and told patient to come to ED due to hemoglobin being 6.5. Hx of asthma, DVT EXAM: PORTABLE CHEST 1 VIEW COMPARISON:  10/06/2020 and older studies. FINDINGS: Cardiac silhouette mildly enlarged.  No mediastinal or hilar masses. Clear lungs.  No convincing pleural effusion.  No pneumothorax. Stable right reverse shoulder arthroplasty. IMPRESSION: No acute cardiopulmonary disease. Electronically Signed   By: Lajean Manes M.D.   On: 02/04/2021 13:39      Scheduled Meds:  allopurinol  100 mg Oral Daily   arformoterol  15 mcg Nebulization BID   And   umeclidinium bromide  1 puff Inhalation Daily   atorvastatin  80 mg Oral Daily   clopidogrel  75 mg Oral Daily   famotidine  20 mg Oral QHS   folic acid  1 mg Oral Daily    furosemide  40 mg Oral Daily   insulin aspart  0-9 Units Subcutaneous TID WC   loratadine  10 mg Oral Daily   magnesium oxide  400 mg Oral QPM   montelukast  10 mg Oral QPM   pantoprazole  40 mg Oral BID AC   polyvinyl alcohol  1 drop Both Eyes BID   predniSONE  8 mg Oral Q breakfast   Continuous Infusions:   LOS: 0 days      Time spent: 25 minutes   Dessa Phi, DO [facility] 02/06/2021, 10:30 AM   Available via Epic secure chat 7am-7pm After these hours, please refer to coverage provider listed on [email or web address]

## 2021-02-06 NOTE — Evaluation (Signed)
Physical Therapy Evaluation Patient Details Name: Ebony Scott MRN: [id number] DOB: 07/10/1950 Today's Date: 02/06/2021  History of Present Illness  Patient is 70 y.o. female presented to ED with complaints of progressively worse fatigue and somnolence in the last few days setting of worsening chronic anemia. She went to see her PCP yesterday who asked the patient to come to the emergency department due to a hemoglobin of 6.5 g/dL. She was given blood transfusion and admitted to the hospital. EGD revealed GAVE s/p APC. PMH significant for DVT and PE on Xarelto, allergic rhinitis, chronic blood loss anemia, angioedema, anxiety/depression, osteoarthritis, mild intermittent asthma, cataracts, C. difficile colitis, diverticulosis, GERD, hiatal hernia, hemorrhoids, IBS, colon polyps, fibromyalgia, headaches, hyperlipidemia, hypothyroid, seronegative rheumatoid arthritis, PVD, CAD with recent drug-eluting stent placement on LAD.    Clinical Impression  Ebony Scott is 70 y.o. female admitted with above HPI and diagnosis. Patient is currently limited by functional impairments below (see PT problem list). Patient lives with her daughter and is independent with rollator for household mobility at baseline. Currently patient limited by fatigue but able to complete short bout of ~15' ambulation with rollator and much encouragement. Educated pt on importance of mobility with NT/RN staff to [facility] for increased OOB activity. Patient will benefit from continued skilled PT interventions to address impairments and progress independence with mobility, recommending HHPT. Acute PT will follow and progress as able.        Recommendations for follow up therapy are one component of a multi-disciplinary discharge planning process, led by the attending physician.  Recommendations may be updated based on patient status, additional functional criteria and insurance authorization.  Follow Up Recommendations Home health PT     Assistance Recommended at Discharge Intermittent Supervision/Assistance  Functional Status Assessment Patient has had a recent decline in their functional status and demonstrates the ability to make significant improvements in function in a reasonable and predictable amount of time.  Equipment Recommendations  None recommended by PT    Recommendations for Other Services       Precautions / Restrictions Precautions Precautions: Fall Restrictions Weight Bearing Restrictions: No      Mobility  Bed Mobility Overal bed mobility: Needs Assistance Bed Mobility: Supine to Sit;Sit to Supine     Supine to sit: Supervision;HOB elevated Sit to supine: Supervision;HOB elevated   General bed mobility comments: supervision for safety, no assist needed, pt taking extra time.    Transfers Overall transfer level: Needs assistance Equipment used: Rollator (4 wheels) Transfers: Sit to/from Stand Sit to Stand: Min guard;Supervision           General transfer comment: guard/supervision for safety, pt using bil UE for power up. pt demonstrated safe management of brakes to lock Rollator prior to stand. pt demonstrated safe mangement to use Rollator for seated rest in hallway.    Ambulation/Gait Ambulation/Gait assistance: Min guard Gait Distance (Feet): 30 Feet (2x15) Assistive device: Rollator (4 wheels) Gait Pattern/deviations: Step-through pattern;Decreased stride length Gait velocity: decr     General Gait Details: cues for rollator management to use for seated rest break when fatigued. pt maintained safe proximity throughout, guarding for safety.  Stairs            Wheelchair Mobility    Modified Rankin (Stroke Patients Only)       Balance Overall balance assessment: Needs assistance Sitting-balance support: Feet supported Sitting balance-Leahy Scale: Good     Standing balance support: Reliant on assistive device for balance;Bilateral upper extremity  supported;During functional  activity Standing balance-Leahy Scale: Poor                               Pertinent Vitals/Pain Pain Assessment: No/denies pain    Home Living Family/patient expects to be discharged to:: Private residence Living Arrangements: Children Available Help at Discharge: Family;Available PRN/intermittently Type of Home: House Home Access: Stairs to enter Entrance Stairs-Rails: Left Entrance Stairs-Number of Steps: 6 Alternate Level Stairs-Number of Steps: family does not let her use inside steps- stays on main floor once inside Home Layout: Two level;Other (Comment);Able to live on main level with bedroom/bathroom;Full bath on main level Home Equipment: Toilet riser;Shower seat;Cane - single point Additional Comments: Daughter is an Therapist, sports that works night shift. pt's son also works.    Prior Function Prior Level of Function : Independent/Modified Independent             Mobility Comments: pt is reliant on Rollator for mobility.       Hand Dominance   Dominant Hand: Right    Extremity/Trunk Assessment   Upper Extremity Assessment Upper Extremity Assessment: Overall WFL for tasks assessed    Lower Extremity Assessment Lower Extremity Assessment: Generalized weakness    Cervical / Trunk Assessment Cervical / Trunk Assessment: Normal  Communication   Communication: No difficulties  Cognition Arousal/Alertness: Awake/alert Behavior During Therapy: WFL for tasks assessed/performed Overall Cognitive Status: Within Functional Limits for tasks assessed                                          General Comments      Exercises     Assessment/Plan    PT Assessment Patient needs continued PT services  PT Problem List Decreased strength;Decreased range of motion;Decreased activity tolerance;Decreased balance;Decreased mobility;Decreased knowledge of use of DME;Decreased knowledge of precautions;Pain       PT  Treatment Interventions DME instruction;Gait training;Stair training;Functional mobility training;Therapeutic activities;Therapeutic exercise;Balance training;Patient/family education    PT Goals (Current goals can be found in the Care Plan section)  Acute Rehab PT Goals Patient Stated Goal: regain strength and return home PT Goal Formulation: With patient Time For Goal Achievement: 02/13/21 Potential to Achieve Goals: Good    Frequency Min 3X/week   Barriers to discharge        Co-evaluation               AM-PAC PT "6 Clicks" Mobility  Outcome Measure Help needed turning from your back to your side while in a flat bed without using bedrails?: None Help needed moving from lying on your back to sitting on the side of a flat bed without using bedrails?: A Little Help needed moving to and from a bed to a chair (including a wheelchair)?: A Little Help needed standing up from a chair using your arms (e.g., wheelchair or bedside chair)?: A Little Help needed to walk in hospital room?: A Little Help needed climbing 3-5 steps with a railing? : A Lot 6 Click Score: 18    End of Session Equipment Utilized During Treatment: Gait belt Activity Tolerance: Patient tolerated treatment well Patient left: in chair;with call bell/phone within reach;with chair alarm set Nurse Communication: Mobility status PT Visit Diagnosis: Muscle weakness (generalized) (M62.81);Difficulty in walking, not elsewhere classified (R26.2)    Time: 9323-5573 PT Time Calculation (min) (ACUTE ONLY): 20 min   Charges:  PT Evaluation $PT Eval Moderate Complexity: 1 Mod          Verner Mould, DPT Acute Rehabilitation Services Office [phone] Pager [phone]   [name] 02/06/2021, 5:43 PM

## 2021-02-07 ENCOUNTER — Telehealth (HOSPITAL_COMMUNITY): Payer: Self-pay

## 2021-02-07 LAB — BASIC METABOLIC PANEL
Anion gap: 10 (ref 5–15)
BUN: 27 mg/dL — ABNORMAL HIGH (ref 8–23)
CO2: 27 mmol/L (ref 22–32)
Calcium: 8.2 mg/dL — ABNORMAL LOW (ref 8.9–10.3)
Chloride: 102 mmol/L (ref 98–111)
Creatinine, Ser: 1.3 mg/dL — ABNORMAL HIGH (ref 0.44–1.00)
GFR, Estimated: 44 mL/min — ABNORMAL LOW (ref >60)
Glucose, Bld: 104 mg/dL — ABNORMAL HIGH (ref 70–99)
Potassium: 3.6 mmol/L (ref 3.5–5.1)
Sodium: 139 mmol/L (ref 135–145)

## 2021-02-07 LAB — CBC
HCT: 27.1 % — ABNORMAL LOW (ref 36.0–46.0)
Hemoglobin: 8.4 g/dL — ABNORMAL LOW (ref 12.0–15.0)
MCH: 30.4 pg (ref 26.0–34.0)
MCHC: 31 g/dL (ref 30.0–36.0)
MCV: 98.2 fL (ref 80.0–100.0)
Platelets: 334 10*3/uL (ref 150–400)
RBC: 2.76 MIL/uL — ABNORMAL LOW (ref 3.87–5.11)
RDW: 19.9 % — ABNORMAL HIGH (ref 11.5–15.5)
WBC: 13.8 10*3/uL — ABNORMAL HIGH (ref 4.0–10.5)
nRBC: 0 % (ref 0.0–0.2)

## 2021-02-07 LAB — GLUCOSE, CAPILLARY
Glucose-Capillary: 103 mg/dL — ABNORMAL HIGH (ref 70–99)
Glucose-Capillary: 183 mg/dL — ABNORMAL HIGH (ref 70–99)

## 2021-02-07 MED ORDER — POLYETHYLENE GLYCOL 3350 17 G PO PACK: 17.0000 g | PACK | Freq: Every day | ORAL | Status: DC | PRN

## 2021-02-07 MED ORDER — DOCUSATE SODIUM 100 MG PO CAPS
100.0000 mg | ORAL_CAPSULE | Freq: Two times a day (BID) | ORAL | Status: DC
  Administered 2021-02-07: 13:00:00 100 mg via ORAL
  Filled 2021-02-07: qty 1

## 2021-02-07 MED ORDER — IRON SUCROSE 20 MG/ML IV SOLN
200.0000 mg | Freq: Once | INTRAVENOUS | Status: AC
  Administered 2021-02-07: 12:00:00 200 mg via INTRAVENOUS
  Filled 2021-02-07: qty 10

## 2021-02-07 MED ORDER — CARBAMIDE PEROXIDE 6.5 % OT SOLN
5.0000 [drp] | Freq: Two times a day (BID) | OTIC | Status: DC
  Filled 2021-02-07: qty 15

## 2021-02-07 MED ORDER — PANTOPRAZOLE SODIUM 40 MG PO TBEC: 40.0000 mg | DELAYED_RELEASE_TABLET | Freq: Two times a day (BID) | ORAL | 2 refills | Status: DC

## 2021-02-07 MED ORDER — SODIUM CHLORIDE 0.9 % IV SOLN: 250.0000 mg | Freq: Once | INTRAVENOUS | Status: DC

## 2021-02-07 MED ORDER — CARBAMIDE PEROXIDE 6.5 % OT SOLN: 5.0000 [drp] | Freq: Two times a day (BID) | OTIC | 0 refills | Status: AC

## 2021-02-07 NOTE — Progress Notes (Signed)
The patient remains stable, no change from am assessment. Pt is alert, oriented x4 ambulatory. Discharge instructions reviewed. Questions, concerns were denied at this time.

## 2021-02-07 NOTE — Discharge Summary (Signed)
Physician Discharge Summary  Ebony Scott NWG:[id number] DOB: 1950-06-28 DOA: 02/04/2021  PCP: [name], MD  Admit date: 02/04/2021 Discharge date: 02/07/2021  Admitted From: Home Disposition:  Home  Recommendations for Outpatient Follow-up:  Follow up with PCP in 1 week Follow up with GI Follow up with Cardiology Repeat CBC to check hemoglobin in 1 week   Discharge Condition: Stable CODE STATUS: Full  Diet recommendation: Heart healthy diet   Brief/Interim Summary: Ebony Scott is a 70 y.o. female with medical history significant of right hand abscess, history of DVT and PE on Xarelto, allergic rhinitis, chronic blood loss anemia, angioedema, anxiety/depression, osteoarthritis, mild intermittent asthma, cataracts, C. difficile colitis, diverticulosis, GERD, hiatal hernia, hemorrhoids, IBS, colon polyps, fibromyalgia, headaches, hyperlipidemia, hypothyroid, seronegative rheumatoid arthritis, PVD, CAD with recent drug-eluting stent placement on LAD about 3 weeks ago with addition of aspirin and clopidogrel to her medication regimen who is coming to the emergency department with complaints of progressively worse fatigue and somnolence in the last few days setting of worsening chronic anemia.  She went to see her PCP yesterday who asked the patient to come to the emergency department due to a hemoglobin of 6.5 g/dL. She was given blood transfusion and admitted to the hospital. Aspirin/plavix/xarelto held. GI consulted.  Patient underwent EGD 12/18 which revealed gastric antral vascular ectasia, treated with APC.  She was resumed on Plavix.  Patient remained stable with stable hemoglobin count.  Discussed with cardiology, Dr. Stanford Breed over the phone, who recommended to stop aspirin but patient should continue on Plavix.  Xarelto was also resumed as patient has history of PE, DVT and is currently on lifelong anticoagulation due to recurrent episode of thromboembolism.  Discharge Diagnoses:   Principal Problem:   Symptomatic anemia Active Problems:   Hyperlipidemia   GERD   Chronic combined systolic and diastolic CHF (congestive heart failure) (HCC)   Chronic kidney disease, stage 3b (HCC)   Chronic pain syndrome   History of pulmonary embolus (PE)   DMII (diabetes mellitus, type 2) (HCC)   History of DVT (deep vein thrombosis)   GAVE (gastric antral vascular ectasia)     Symptomatic anemia with acute on chronic blood loss, secondary to GAVE, iron deficiency anemia -GI consulted, status post EGD 12/18, showed GAVE s/p APC  -Hgb stable today  -IV iron today  -Follow-up with GI outpatient   CAD with DES -Spoke on the phone with Dr. Stanford Breed on 12/20.  Okay to stop aspirin.  Continue Plavix   Diabetes mellitus type 2, well controlled  -A1c 5.5  -Resume home jardiance and glipizide   Hyperlipidemia -Continue atorvastatin  Chronic combined systolic and diastolic heart failure -Continue Lasix  CKD stage IIIb -Stable  History of PE, DVT -Resume xarelto    Rheumatoid arthritis -Continue prednisone  Discharge Instructions  Discharge Instructions     Call MD for:  difficulty breathing, headache or visual disturbances   Complete by: As directed    Call MD for:  extreme fatigue   Complete by: As directed    Call MD for:  persistant dizziness or light-headedness   Complete by: As directed    Call MD for:  persistant nausea and vomiting   Complete by: As directed    Call MD for:  severe uncontrolled pain   Complete by: As directed    Call MD for:  temperature >100.4   Complete by: As directed    Discharge instructions   Complete by: As directed  STOP ASPIRIN. CONTINUE XARELTO AND PLAVIX.   Increase activity slowly   Complete by: As directed       Allergies as of 02/07/2021       Reactions   Baclofen Other (See Comments)   Altered mental status requiring a 3-day hospital stay- patient became unresponsive   Dust Mite Extract Shortness Of Breath,  Other (See Comments)   "sneezing" (02/20/2012)   Molds & Smuts Shortness Of Breath   Morphine And Related Hives, Itching   Other Shortness Of Breath, Other (See Comments)   Grass and weeds "sneezing; filled sinuses" (02/20/2012)   Penicillins Rash, Other (See Comments)   "welts" (02/20/2012) Tolerated Ancef 02/16/20.   Rofecoxib Swelling, Other (See Comments)   Vioxx- feet swelling   Shellfish Allergy Anaphylaxis, Shortness Of Breath, Itching, Rash   Tetracycline Hcl Nausea And Vomiting   Xolair [omalizumab] Other (See Comments)   Caused Blood clot   Zoledronic Acid Other (See Comments)   Fever, Put in hospital, dr said it was a reaction from a reaction  Other reaction(s): Unknown   Dilaudid [hydromorphone Hcl] Itching   Levofloxacin Other (See Comments)   GI upset   Oxycodone Hcl Nausea And Vomiting   Paroxetine Nausea And Vomiting, Other (See Comments)   Paxil   Celecoxib Swelling, Other (See Comments)   Feet swelling   Diltiazem Hcl Swelling   Hydrocodone-acetaminophen Other (See Comments)   Unknown reaction   Lactose Intolerance (gi) Other (See Comments)   Bloating and gas   Morphine Sulfate Other (See Comments)   "Gets loopy"   Oxycodone-acetaminophen Other (See Comments)   Unknown reaction   Rituximab    Anemia    Tree Extract Other (See Comments)   "tested and told I was allergic to it; never experienced a reaction to it" (02/20/2012)   Penicillin G Procaine Rash, Other (See Comments)   Welts, also        Medication List     STOP taking these medications    aspirin EC 81 MG tablet       TAKE these medications    acetaminophen 650 MG CR tablet Commonly known as: TYLENOL Take 1,300 mg by mouth every 8 (eight) hours as needed for pain.   albuterol 108 (90 Base) MCG/ACT inhaler Commonly known as: VENTOLIN HFA Inhale 2 puffs into the lungs every 6 (six) hours as needed for wheezing or shortness of breath.   albuterol (2.5 MG/3ML) 0.083% nebulizer  solution Commonly known as: PROVENTIL Take 3 mLs (2.5 mg total) by nebulization every 4 (four) hours as needed for wheezing or shortness of breath.   ALIGN PO Take 1 capsule by mouth daily.   allopurinol 100 MG tablet Commonly known as: ZYLOPRIM Take 100 mg by mouth daily.   Alpha-Lipoic Acid 600 MG Tabs Take 600 mg by mouth daily.   atorvastatin 80 MG tablet Commonly known as: LIPITOR TAKE 1 TABLET BY MOUTH DAILY   azelastine 0.1 % nasal spray Commonly known as: ASTELIN Use in each nostril as directed What changed:  how much to take when to take this reasons to take this additional instructions   BEANO PO Take 2 tablets by mouth 3 (three) times daily with meals.   benzonatate 200 MG capsule Commonly known as: TESSALON Take 1 capsule (200 mg total) by mouth 3 (three) times daily as needed for cough.   BLOOD GLUCOSE TEST STRIPS Strp Please dispense per patient and insurance preference. Use as directed to monitor FSBS 4x . Dx: E11.65  budesonide 0.5 MG/2ML nebulizer solution Commonly known as: Pulmicort Take 2 mLs (0.5 mg total) by nebulization 2 (two) times daily as needed.   CALCIUM-MAGNESIUM PO Take 1 tablet by mouth every evening.   carbamide peroxide 6.5 % OTIC solution Commonly known as: DEBROX Place 5 drops into the left ear 2 (two) times daily for 5 days.   cetirizine 10 MG tablet Commonly known as: ZYRTEC Take 1 tablet (10 mg total) by mouth daily. What changed: when to take this   cholecalciferol 25 MCG (1000 UNIT) tablet Commonly known as: VITAMIN D 1,000 Units every evening.   clopidogrel 75 MG tablet Commonly known as: Plavix Take 1 tablet (75 mg total) by mouth daily.   Dexilant 60 MG capsule Generic drug: dexlansoprazole TAKE 1 CAPSULE BY MOUTH EACH MORNING   diclofenac Sodium 1 % Gel Commonly known as: VOLTAREN Apply 1 application topically 4 (four) times daily as needed (pain).   empagliflozin 10 MG Tabs tablet Commonly known as:  Jardiance Take 1 tablet (10 mg total) by mouth daily before breakfast.   EPINEPHrine 0.3 mg/0.3 mL Soaj injection Commonly known as: EPI-PEN USE AS DIRECTED BY YOUR PHYSICIAN INTRAMUSCULARLY AS NEEDED FOR ANAPHYLAXIS   famotidine 20 MG tablet Commonly known as: PEPCID Take 1 tablet (20 mg total) by mouth daily. What changed: when to take this   fluticasone 50 MCG/ACT nasal spray Commonly known as: FLONASE USE 2 SPRAYS IN EACH NOSTRIL DAILY What changed:  how to take this when to take this reasons to take this   folic acid 1 MG tablet Commonly known as: FOLVITE Take 1 mg by mouth daily.   furosemide 40 MG tablet Commonly known as: LASIX Take 1 tablet (40 mg total) by mouth daily. Take 1 table twice a day for 3 days and then back to once daily. What changed: additional instructions   Gerhardt's butt cream Crea Apply topically 3 times a day as needed for itching or irritation   glipiZIDE 5 MG 24 hr tablet Commonly known as: GLUCOTROL XL Take 5 mg by mouth daily with breakfast.   guaiFENesin 600 MG 12 hr tablet Commonly known as: MUCINEX Take 1 tablet (600 mg total) by mouth 2 (two) times daily as needed for cough or to loosen phlegm. What changed: when to take this   Hizentra 2 GM/10ML Soln Generic drug: Immune Globulin (Human) Inject 1 Dose into the skin once a week.   LACTOSE FAST ACTING RELIEF PO Take 3 tablets by mouth in the morning, at noon, and at bedtime.   magnesium oxide 400 MG tablet Commonly known as: MAG-OX Take 400 mg by mouth every evening.   montelukast 10 MG tablet Commonly known as: SINGULAIR Take 1 tablet (10 mg total) by mouth daily. What changed: when to take this   nystatin 100000 UNIT/ML suspension Commonly known as: MYCOSTATIN Use as directed 15 mLs in the mouth or throat at bedtime as needed (mouth sores).   nystatin ointment Commonly known as: MYCOSTATIN Apply 1 application topically 2 (two) times daily as needed (yeast).    pantoprazole 40 MG tablet Commonly known as: PROTONIX Take 1 tablet (40 mg total) by mouth 2 (two) times daily before a meal.   predniSONE 1 MG tablet Commonly known as: DELTASONE Take 3 mg by mouth as directed. Take along with 5 mg tablet=8 mg What changed: Another medication with the same name was removed. Continue taking this medication, and follow the directions you see here.   predniSONE 5 MG tablet  Commonly known as: DELTASONE Take 5 mg by mouth as directed. Take along with 3 tablets of (1 mg)=8 mg What changed: Another medication with the same name was removed. Continue taking this medication, and follow the directions you see here.   PRENATAL VITAMINS PO Take 1 tablet by mouth daily.   PRESCRIPTION MEDICATION Place 4 drops into both ears See admin instructions. Chloramp- sulfu - ampho d-h droc 100-100-5 hydrocortisone, otic ear drops. Instill 4 drops in to each ear daily for the first 5 days of each month   Stiolto Respimat 2.5-2.5 MCG/ACT Aers Generic drug: Tiotropium Bromide-Olodaterol Inhale 2 puffs into the lungs daily.   Systane Complete 0.6 % Soln Generic drug: Propylene Glycol Place 1 drop into both eyes 2 (two) times daily.   traMADol 50 MG tablet Commonly known as: ULTRAM Take 1 tablet (50 mg total) by mouth at bedtime as needed. What changed: when to take this   Xarelto 20 MG Tabs tablet Generic drug: rivaroxaban TAKE 1 TABLET BY MOUTH DAILY WITH SUPPER        Follow-up Information     [name], MD. Schedule an appointment as soon as possible for a visit in 1 week(s).   Specialty: Family Medicine Contact information: [address] [place] [address] [phone]         Buford Dresser, MD Follow up.   Specialty: Cardiology Contact information: [address] [place] [address] [phone]         Gatha Mayer, MD Follow up.   Specialty: Gastroenterology Contact information: [address] [place] [address] [phone]                Allergies  Allergen Reactions   Baclofen Other (See Comments)    Altered mental status requiring a 3-day hospital stay- patient became unresponsive   Dust Mite Extract Shortness Of Breath and Other (See Comments)    "sneezing" (02/20/2012)   Molds & Smuts Shortness Of Breath   Morphine And Related Hives and Itching   Other Shortness Of Breath and Other (See Comments)    Grass and weeds "sneezing; filled sinuses" (02/20/2012)   Penicillins Rash and Other (See Comments)    "welts" (02/20/2012) Tolerated Ancef 02/16/20.    Rofecoxib Swelling and Other (See Comments)    Vioxx- feet swelling    Shellfish Allergy Anaphylaxis, Shortness Of Breath, Itching and Rash   Tetracycline Hcl Nausea And Vomiting   Xolair [Omalizumab] Other (See Comments)    Caused Blood clot   Zoledronic Acid Other (See Comments)    Fever, Put in hospital, dr said it was a reaction from a reaction  Other reaction(s): Unknown   Dilaudid [Hydromorphone Hcl] Itching   Levofloxacin Other (See Comments)    GI upset   Oxycodone Hcl Nausea And Vomiting   Paroxetine Nausea And Vomiting and Other (See Comments)    Paxil    Celecoxib Swelling and Other (See Comments)    Feet swelling   Diltiazem Hcl Swelling   Hydrocodone-Acetaminophen Other (See Comments)    Unknown reaction   Lactose Intolerance (Gi) Other (See Comments)    Bloating and gas   Morphine Sulfate Other (See Comments)    "Gets loopy"   Oxycodone-Acetaminophen Other (See Comments)    Unknown reaction   Rituximab     Anemia    Tree Extract Other (See Comments)    "tested and told I was allergic to it; never experienced a reaction to  it" (02/20/2012)   Penicillin G Procaine Rash and Other (See Comments)    Welts, also    Consultations: GI    Procedures/Studies: CARDIAC CATHETERIZATION  Result Date: 01/11/2021   Prox LAD lesion is 95% stenosed proximal to 1st Diag &  Mid LAD lesion  is 90% stenosed following   Mid LAD lesion is 90% stenosed.   A drug-eluting stent was successfully placed covering both lesions using a STENT ONYX FRONTIER 2.25X34.   Post intervention, there is a 0% residual stenosis.   --------------------------------------------------------------   Hemodynamic findings consistent with moderate pulmonary hypertension.   There is no aortic valve stenosis. SUMMARY Severe single-vessel CAD with 95% and 90% stenoses of the proximal and mid LAD at major 1st Diag branch.  Likely be culprit for reduced EF and anterior hypokinesis on echo. Successful DES PCI of the LAD using an Onyx Frontier DES 2.25 mm x 34 mm postdilated in tapered fashion from 2.6 to 2.30mm.  TIMI III flow pre and post, reduced to 0% Otherwise minimal CAD in the Right dominant system. 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 Moderate to severely reduced cardiac function with Cardiac Output-Index of 4.05-2.59 by Fick and 3.75-2.40 by Thermal Dilution. RECOMMENDATIONS Continue to titrate medical management of chronic Systolic and diastolic heart failure.  She was given 40 mg IV Lasix on the Cath Lab table.  Recommend that she takes 40 mg twice daily oral Lasix for 3 days postprocedure.  She will need close follow-up with either primary cardiologist or APP to initiate titration of medications. Clinically stable for d/c today Glenetta Hew, MD  DG Chest [facility] 1 View  Result Date: 02/04/2021 CLINICAL DATA:  Per chart: Patient reports she had blood work at [employer] office yesterday, pcp office called today and told patient to come to ED due to hemoglobin being 6.5. Hx of asthma, DVT EXAM: PORTABLE CHEST 1 VIEW COMPARISON:  10/06/2020 and older studies. FINDINGS: Cardiac silhouette mildly enlarged.  No mediastinal or hilar masses. Clear lungs.  No convincing pleural effusion.  No  pneumothorax. Stable right reverse shoulder arthroplasty. IMPRESSION: No acute cardiopulmonary disease. Electronically Signed   By: Lajean Manes M.D.   On: 02/04/2021 13:39       Discharge Exam: Vitals:   02/07/21 0438 02/07/21 0757  BP: (!) 115/52   Pulse: 82   Resp: 20   Temp: 98.7 F (37.1 C)   SpO2: 100% 97%    General: Pt is alert, awake, not in acute distress ENT: Right ear drum without erythema or bulging, Left ear drum obscured due to ear wax  Cardiovascular: RRR, S1/S2 +, no edema Respiratory: CTA bilaterally, no wheezing, no rhonchi, no respiratory distress, no conversational dyspnea  Abdominal: Soft, NT, ND, bowel sounds + Extremities: no edema, no cyanosis Psych: Normal mood and affect, stable judgement and insight     The results of significant diagnostics from this hospitalization (including imaging, microbiology, ancillary and laboratory) are listed below for reference.     Microbiology: Recent Results (from the past 240 hour(s))  Resp Panel by RT-PCR (Flu A&B, Covid) Nasopharyngeal Swab     Status: None   Collection Time: 02/04/21  1:50 PM   Specimen: Nasopharyngeal Swab; Nasopharyngeal(NP) swabs in vial transport medium  Result Value Ref Range Status   SARS Coronavirus 2 by RT PCR NEGATIVE NEGATIVE Final    Comment: (NOTE) SARS-CoV-2 target nucleic  acids are NOT DETECTED.  The SARS-CoV-2 RNA is generally detectable in upper respiratory specimens during the acute phase of infection. The lowest concentration of SARS-CoV-2 viral copies this assay can detect is 138 copies/mL. A negative result does not preclude SARS-Cov-2 infection and should not be used as the sole basis for treatment or other patient management decisions. A negative result may occur with  improper specimen collection/handling, submission of specimen other than nasopharyngeal swab, presence of viral mutation(s) within the areas targeted by this assay, and inadequate number of  viral copies(<138 copies/mL). A negative result must be combined with clinical observations, patient history, and epidemiological information. The expected result is Negative.  Fact Sheet for Patients:  EntrepreneurPulse.com.au  Fact Sheet for Healthcare Providers:  IncredibleEmployment.be  This test is no t yet approved or cleared by the [place] FDA and  has been authorized for detection and/or diagnosis of SARS-CoV-2 by FDA under an Emergency Use Authorization (EUA). This EUA will remain  in effect (meaning this test can be used) for the duration of the COVID-19 declaration under Section 564(b)(1) of the Act, 21 U.S.C.section 360bbb-3(b)(1), unless the authorization is terminated  or revoked sooner.       Influenza A by PCR NEGATIVE NEGATIVE Final   Influenza B by PCR NEGATIVE NEGATIVE Final    Comment: (NOTE) The Xpert Xpress SARS-CoV-2/FLU/RSV plus assay is intended as an aid in the diagnosis of influenza from Nasopharyngeal swab specimens and should not be used as a sole basis for treatment. Nasal washings and aspirates are unacceptable for Xpert Xpress SARS-CoV-2/FLU/RSV testing.  Fact Sheet for Patients: EntrepreneurPulse.com.au  Fact Sheet for Healthcare Providers: IncredibleEmployment.be  This test is not yet approved or cleared by the [place] FDA and has been authorized for detection and/or diagnosis of SARS-CoV-2 by FDA under an Emergency Use Authorization (EUA). This EUA will remain in effect (meaning this test can be used) for the duration of the COVID-19 declaration under Section 564(b)(1) of the Act, 21 U.S.C. section 360bbb-3(b)(1), unless the authorization is terminated or revoked.  Performed at [facility], [place] [address] [place], [place] [address]      Labs: BNP (last 3 results) Recent Labs    10/04/20 1455 10/09/20 0904 01/19/21 1434  BNP  1,083.7* 3,823.5* 017.7*   Basic Metabolic Panel: Recent Labs  Lab 02/03/21 0817 02/04/21 1150 02/05/21 0430 02/06/21 0451 02/07/21 0531  NA 141 137 140 140 139  K 4.4 3.7 4.3 3.9 3.6  CL 102 102 104 103 102  CO2 20 26 27 28 27   GLUCOSE 123* 164* 79 94 104*  BUN 34* 38* 30* 28* 27*  CREATININE 1.14* 1.35* 1.24* 1.43* 1.30*  CALCIUM 8.7 8.5* 8.3* 8.5* 8.2*   Liver Function Tests: Recent Labs  Lab 02/04/21 1150  AST 30  ALT 33  ALKPHOS 66  BILITOT 0.6  PROT 6.5  ALBUMIN 3.2*   No results for input(s): LIPASE, AMYLASE in the last 168 hours. No results for input(s): AMMONIA in the last 168 hours. CBC: Recent Labs  Lab 02/03/21 0817 02/04/21 1150 02/04/21 2105 02/05/21 0430 02/06/21 0451 02/07/21 0531  WBC 11.2* 18.0*  --  11.6* 17.2* 13.8*  NEUTROABS  --  14.9*  --   --   --   --   HGB 6.4* 6.3* 7.3* 7.8* 8.5* 8.4*  HCT 20.6* 21.4* 23.3* 25.9* 27.9* 27.1*  MCV 102* 107.0*  --  99.2 98.2 98.2  PLT 306 317  --  307 334 334  Cardiac Enzymes: No results for input(s): CKTOTAL, CKMB, CKMBINDEX, TROPONINI in the last 168 hours. BNP: Invalid input(s): POCBNP CBG: Recent Labs  Lab 02/06/21 1225 02/06/21 1616 02/06/21 2104 02/07/21 0740 02/07/21 1108  GLUCAP 165* 156* 206* 103* 183*   D-Dimer No results for input(s): DDIMER in the last 72 hours. Hgb A1c Recent Labs    02/05/21 0430  HGBA1C 5.5   Lipid Profile No results for input(s): CHOL, HDL, LDLCALC, TRIG, CHOLHDL, LDLDIRECT in the last 72 hours. Thyroid function studies No results for input(s): TSH, T4TOTAL, T3FREE, THYROIDAB in the last 72 hours.  Invalid input(s): FREET3 Anemia work up Recent Labs    02/06/21 1025  TIBC 417  IRON 14*   Urinalysis    Component Value Date/Time   COLORURINE YELLOW 02/23/2020 1300   APPEARANCEUR CLEAR 02/23/2020 1300   LABSPEC 1.014 02/23/2020 1300   PHURINE 6.0 02/23/2020 1300   GLUCOSEU NEGATIVE 02/23/2020 1300   HGBUR NEGATIVE 02/23/2020 1300    BILIRUBINUR NEGATIVE 02/23/2020 1300   KETONESUR NEGATIVE 02/23/2020 1300   PROTEINUR NEGATIVE 02/23/2020 1300   UROBILINOGEN 0.2 05/17/2014 1113   NITRITE NEGATIVE 02/23/2020 1300   LEUKOCYTESUR NEGATIVE 02/23/2020 1300   Sepsis Labs Invalid input(s): PROCALCITONIN,  WBC,  LACTICIDVEN Microbiology Recent Results (from the past 240 hour(s))  Resp Panel by RT-PCR (Flu A&B, Covid) Nasopharyngeal Swab     Status: None   Collection Time: 02/04/21  1:50 PM   Specimen: Nasopharyngeal Swab; Nasopharyngeal(NP) swabs in vial transport medium  Result Value Ref Range Status   SARS Coronavirus 2 by RT PCR NEGATIVE NEGATIVE Final    Comment: (NOTE) SARS-CoV-2 target nucleic acids are NOT DETECTED.  The SARS-CoV-2 RNA is generally detectable in upper respiratory specimens during the acute phase of infection. The lowest concentration of SARS-CoV-2 viral copies this assay can detect is 138 copies/mL. A negative result does not preclude SARS-Cov-2 infection and should not be used as the sole basis for treatment or other patient management decisions. A negative result may occur with  improper specimen collection/handling, submission of specimen other than nasopharyngeal swab, presence of viral mutation(s) within the areas targeted by this assay, and inadequate number of viral copies(<138 copies/mL). A negative result must be combined with clinical observations, patient history, and epidemiological information. The expected result is Negative.  Fact Sheet for Patients:  EntrepreneurPulse.com.au  Fact Sheet for Healthcare Providers:  IncredibleEmployment.be  This test is no t yet approved or cleared by the [place] FDA and  has been authorized for detection and/or diagnosis of SARS-CoV-2 by FDA under an Emergency Use Authorization (EUA). This EUA will remain  in effect (meaning this test can be used) for the duration of the COVID-19 declaration under  Section 564(b)(1) of the Act, 21 U.S.C.section 360bbb-3(b)(1), unless the authorization is terminated  or revoked sooner.       Influenza A by PCR NEGATIVE NEGATIVE Final   Influenza B by PCR NEGATIVE NEGATIVE Final    Comment: (NOTE) The Xpert Xpress SARS-CoV-2/FLU/RSV plus assay is intended as an aid in the diagnosis of influenza from Nasopharyngeal swab specimens and should not be used as a sole basis for treatment. Nasal washings and aspirates are unacceptable for Xpert Xpress SARS-CoV-2/FLU/RSV testing.  Fact Sheet for Patients: EntrepreneurPulse.com.au  Fact Sheet for Healthcare Providers: IncredibleEmployment.be  This test is not yet approved or cleared by the [place] FDA and has been authorized for detection and/or diagnosis of SARS-CoV-2 by FDA under an Emergency Use Authorization (EUA). This EUA will  remain in effect (meaning this test can be used) for the duration of the COVID-19 declaration under Section 564(b)(1) of the Act, 21 U.S.C. section 360bbb-3(b)(1), unless the authorization is terminated or revoked.  Performed at [facility], [place] [address] [place], [place] [address]      Patient was seen and examined on the day of discharge and was found to be in stable condition. Time coordinating discharge: 25 minutes including assessment and coordination of care, as well as examination of the patient.   SIGNED:  Dessa Phi, DO [facility] 02/07/2021, 12:02 PM

## 2021-02-07 NOTE — Plan of Care (Signed)
  Problem: Clinical Measurements: Goal: Ability to maintain clinical measurements within normal limits will improve Outcome: Progressing Goal: Will remain free from infection Outcome: Progressing Goal: Diagnostic test results will improve Outcome: Progressing Goal: Respiratory complications will improve Outcome: Progressing Goal: Cardiovascular complication will be avoided Outcome: Progressing   Problem: Elimination: Goal: Will not experience complications related to bowel motility Outcome: Progressing Goal: Will not experience complications related to urinary retention Outcome: Progressing   Problem: Safety: Goal: Ability to remain free from injury will improve Outcome: Progressing   

## 2021-02-07 NOTE — Telephone Encounter (Signed)
Called pt to see if she is interested in the cardiac rehab program, pt stated that she is interested but she was in the hospital and will have to do the program at a later time. I advised pt that we will call her back at a later date after she is cleared following this hospitalization.

## 2021-02-07 NOTE — Progress Notes (Signed)
Progress Note   Subjective  Chief Complaint: Anemia status post endoscopy with Dr. 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   Patient is eating breakfast and has ordered lunch, states she has not had any further nausea today.  Did have yesterday morning. Last bowel movement was day before yesterday, hard stool with bright red blood only on toilet paper.  Patient did physical therapy yesterday just walking with walker from bed down to the hall and back, states she had shortness of breath with this but denies dizziness or chest pain.  States she feels deconditioned. Denies fever, chills, abdominal pain, vomiting.    Objective   Vital signs in last 24 hours: Temp:  [98.7 F (37.1 C)-99.1 F (37.3 C)] 98.7 F (37.1 C) (12/20 0438) Pulse Rate:  [82-88] 82 (12/20 0438) Resp:  [20-22] 20 (12/20 0438) BP: (111-129)/(48-61) 115/52 (12/20 0438) SpO2:  [95 %-100 %] 97 % (12/20 0757) Last BM Date: 02/05/21  General:  cushingoid appearing female in no acute distress  Heart:  regular rate and rhythm, no murmurs or gallops Pulm: Clear anteriorly; no wheezing Abdomen:  Soft, Obese AB, Normal bowel sounds. mild tenderness in the LLQ. Without guarding and Without rebound, without hepatomegaly. Extremities:  Without edema. Neurologic:  Alert and  oriented x4;  grossly normal neurologically.  Skin:   Dry and intact without significant lesions or rashes. Psychiatric: Cooperative. Normal mood and affect.  Intake/Output from previous day: 12/19 0701 - 12/20 0700 In: 1320 [P.O.:1320] Out: 950 [Urine:950] Intake/Output this shift: Total I/O In: 470 [P.O.:470] Out: -   Lab Results: Recent Labs    02/05/21 0430 02/06/21 0451 02/07/21 0531  WBC 11.6* 17.2* 13.8*  HGB 7.8* 8.5* 8.4*  HCT 25.9* 27.9* 27.1*  PLT 307 334 334   BMET Recent Labs    02/05/21 0430 02/06/21 0451 02/07/21 0531  NA 140 140 139  K 4.3 3.9 3.6  CL 104 103 102  CO2 27 28 27   GLUCOSE 79 94 104*   BUN 30* 28* 27*  CREATININE 1.24* 1.43* 1.30*  CALCIUM 8.3* 8.5* 8.2*   LFT Recent Labs    02/04/21 1150  PROT 6.5  ALBUMIN 3.2*  AST 30  ALT 33  ALKPHOS 66  BILITOT 0.6   PT/INR Recent Labs    02/04/21 1150  LABPROT 14.1  INR 1.1    Studies/Results: No results found.     Impression    Anemia progressive over 4 months after addition of plavix to chronic xarelto after cardiac stenting last month, having melena, last colon 2018 due 03/2021 Dr. Carlean Purl S/p 1 PRBC on 02/04/2021 Status post EGD 02/05/2021 which showed GAVE s/p APC Hemoglobin 7.3-->7.8-->8.5--> today steady at 8.4 Iron 14-being scheduled for IV iron transfusion today. Hemoglobin steady, denies any further bowel movements.   CAD s/p stent to LAD on 01/11/2021.   Low-dose aspirin and Plavix added to chronic Xarelto chronically in use for history DVT.   First dose of Plavix 02/05/2021 Aspirin and Xarelto currently on hold.   Seronegative rheumatoid arthritis.   Failed to respond to or lost response to multiple DMARDs.   Now on Prednisone 10 mg daily.    Deconditioning Would suggest continuing inpatient physical therapy outpatient     Plan   -getting IV iron -Add on Colace 100 mg twice daily -MiraLAX as needed - can continue PPI -Patient without any further bleeding as evident by improving hemoglobin, she is due for colonoscopy 03/2021. -Patient is back on  Plavix due to recent stent in LAD, has some shortness of breath, deconditioning but denies chest pain. -Can sign off today, close outpatient follow-up to schedule for colonoscopy and potentially repeat endoscopy timing pending on cardiology and anticoagulation.    LOS: 1 day   Vladimir Crofts  02/07/2021, 11:09 AM

## 2021-02-08 ENCOUNTER — Other Ambulatory Visit: Payer: Self-pay

## 2021-02-08 ENCOUNTER — Telehealth: Payer: Self-pay

## 2021-02-08 ENCOUNTER — Other Ambulatory Visit: Payer: Self-pay | Admitting: Family Medicine

## 2021-02-08 DIAGNOSIS — D649 Anemia, unspecified: Secondary | ICD-10-CM

## 2021-02-08 NOTE — Telephone Encounter (Signed)
CBC entered into Epic. Pt made aware to come to the lab on Tuesday to have lab drawn.  Pt scheduled for on OV on 02/24/2021 at 1:30 with Alonza Bogus PA: Pt verbalized understanding with all questions answered.

## 2021-02-08 NOTE — Telephone Encounter (Signed)
Patient called to advise [facility] sending over refill requests for meds needed; patient already out of some meds. Requesting refills before holiday weekend.  Pharmacy info confirmed as:  [facility], [address] [place] [address]  Phone:  [phone]  Fax:  [phone]   Please advise at [phone].

## 2021-02-08 NOTE — Telephone Encounter (Signed)
Transition Care Management Follow-up Telephone Call Date of discharge and from where: [facility] 02-07-21 DX: symptomatic anemia  How have you been since you were released from the hospital? Better just weak  Any questions or concerns? No  Items Reviewed: Did the pt receive and understand the discharge instructions provided? Yes  Medications obtained and verified? Yes  Other? No  Any new allergies since your discharge? No  Dietary orders reviewed? No Do you have support at home? Yes   Home Care and Equipment/Supplies: Were home health services ordered? No- OT is already in the home- pt unaware of the company    Has the agency set up a time to come to the patient's home? yes Were any new equipment or medical supplies ordered?  No Were you able to get the supplies/equipment? not applicable Do you have any questions related to the use of the equipment or supplies? No  Functional Questionnaire: (I = Independent and D = Dependent) ADLs: D  Bathing/Dressing- I  Meal Prep- I  Eating- I  Maintaining continence- I  Transferring/Ambulation- I  Managing Meds- D- Daughter sorts the medication for patient   Follow up appointments reviewed:  PCP Hospital f/u appt confirmed? Yes  Scheduled to see Dr Dennard Schaumann on 02-14-21 @ 330pm. [facility] f/u appt confirmed? Yes  Scheduled to see Dr Harrell Gave  on 02-24-21 @ 1105am and Dr Myrtice Lauth on 02-24-21 at 130pm. Are transportation arrangements needed? No  If their condition worsens, is the pt aware to call PCP or go to the Emergency Dept.? Yes Was the patient provided with contact information for the PCP's office or ED? Yes Was to pt encouraged to call back with questions or concerns? Yes

## 2021-02-09 DIAGNOSIS — M71121 Other infective bursitis, right elbow: Secondary | ICD-10-CM | POA: Diagnosis not present

## 2021-02-09 DIAGNOSIS — E1151 Type 2 diabetes mellitus with diabetic peripheral angiopathy without gangrene: Secondary | ICD-10-CM | POA: Diagnosis not present

## 2021-02-09 DIAGNOSIS — K219 Gastro-esophageal reflux disease without esophagitis: Secondary | ICD-10-CM | POA: Diagnosis not present

## 2021-02-09 DIAGNOSIS — N1832 Chronic kidney disease, stage 3b: Secondary | ICD-10-CM | POA: Diagnosis not present

## 2021-02-09 DIAGNOSIS — M069 Rheumatoid arthritis, unspecified: Secondary | ICD-10-CM | POA: Diagnosis not present

## 2021-02-09 DIAGNOSIS — I5042 Chronic combined systolic (congestive) and diastolic (congestive) heart failure: Secondary | ICD-10-CM | POA: Diagnosis not present

## 2021-02-09 DIAGNOSIS — E1122 Type 2 diabetes mellitus with diabetic chronic kidney disease: Secondary | ICD-10-CM | POA: Diagnosis not present

## 2021-02-09 DIAGNOSIS — F32A Depression, unspecified: Secondary | ICD-10-CM | POA: Diagnosis not present

## 2021-02-09 DIAGNOSIS — J479 Bronchiectasis, uncomplicated: Secondary | ICD-10-CM | POA: Diagnosis not present

## 2021-02-09 MED ORDER — DEXLANSOPRAZOLE 60 MG PO CPDR: 60.0000 mg | DELAYED_RELEASE_CAPSULE | Freq: Every day | ORAL | 3 refills | Status: DC

## 2021-02-09 MED ORDER — FOLIC ACID 1 MG PO TABS: 1.0000 mg | ORAL_TABLET | Freq: Every day | ORAL | 3 refills | Status: DC

## 2021-02-09 MED ORDER — ALLOPURINOL 100 MG PO TABS: 100.0000 mg | ORAL_TABLET | Freq: Every day | ORAL | 3 refills | Status: DC

## 2021-02-09 MED ORDER — ATORVASTATIN CALCIUM 80 MG PO TABS: 80.0000 mg | ORAL_TABLET | Freq: Every day | ORAL | 3 refills | Status: DC

## 2021-02-09 NOTE — Telephone Encounter (Signed)
Spoke with pt to clarify refill requests.  Please advise on prednisone dose, pt states she is taking the 1mg  tablets and weaning down. Not sure what current dose should be.  Tramadol Last refill 12/05/20 ,#30, 1 refill

## 2021-02-09 NOTE — Telephone Encounter (Signed)
Pt called back in to inquire about the refills that she requested for traMADol (ULTRAM) 50 MG tablet, DEXILANT 60 MG capsule, folic acid (FOLVITE) 1 MG tablet . Pt states that she is completley out of these meds and would like to have these meds refilled before the office closes for the holiday and she will be without them. Please advise.  Cb#: [phone]

## 2021-02-10 MED ORDER — TRAMADOL HCL 50 MG PO TABS: 50.0000 mg | ORAL_TABLET | Freq: Every evening | ORAL | 1 refills | Status: DC | PRN

## 2021-02-12 IMAGING — DX CHEST - 2 VIEW
2 series · 2 of 2 positions shown · non-contrast
Comparison: 03/18/2018

CLINICAL DATA: Dyspnea on exertion

EXAM:
CHEST - 2 VIEW

[chest pa]
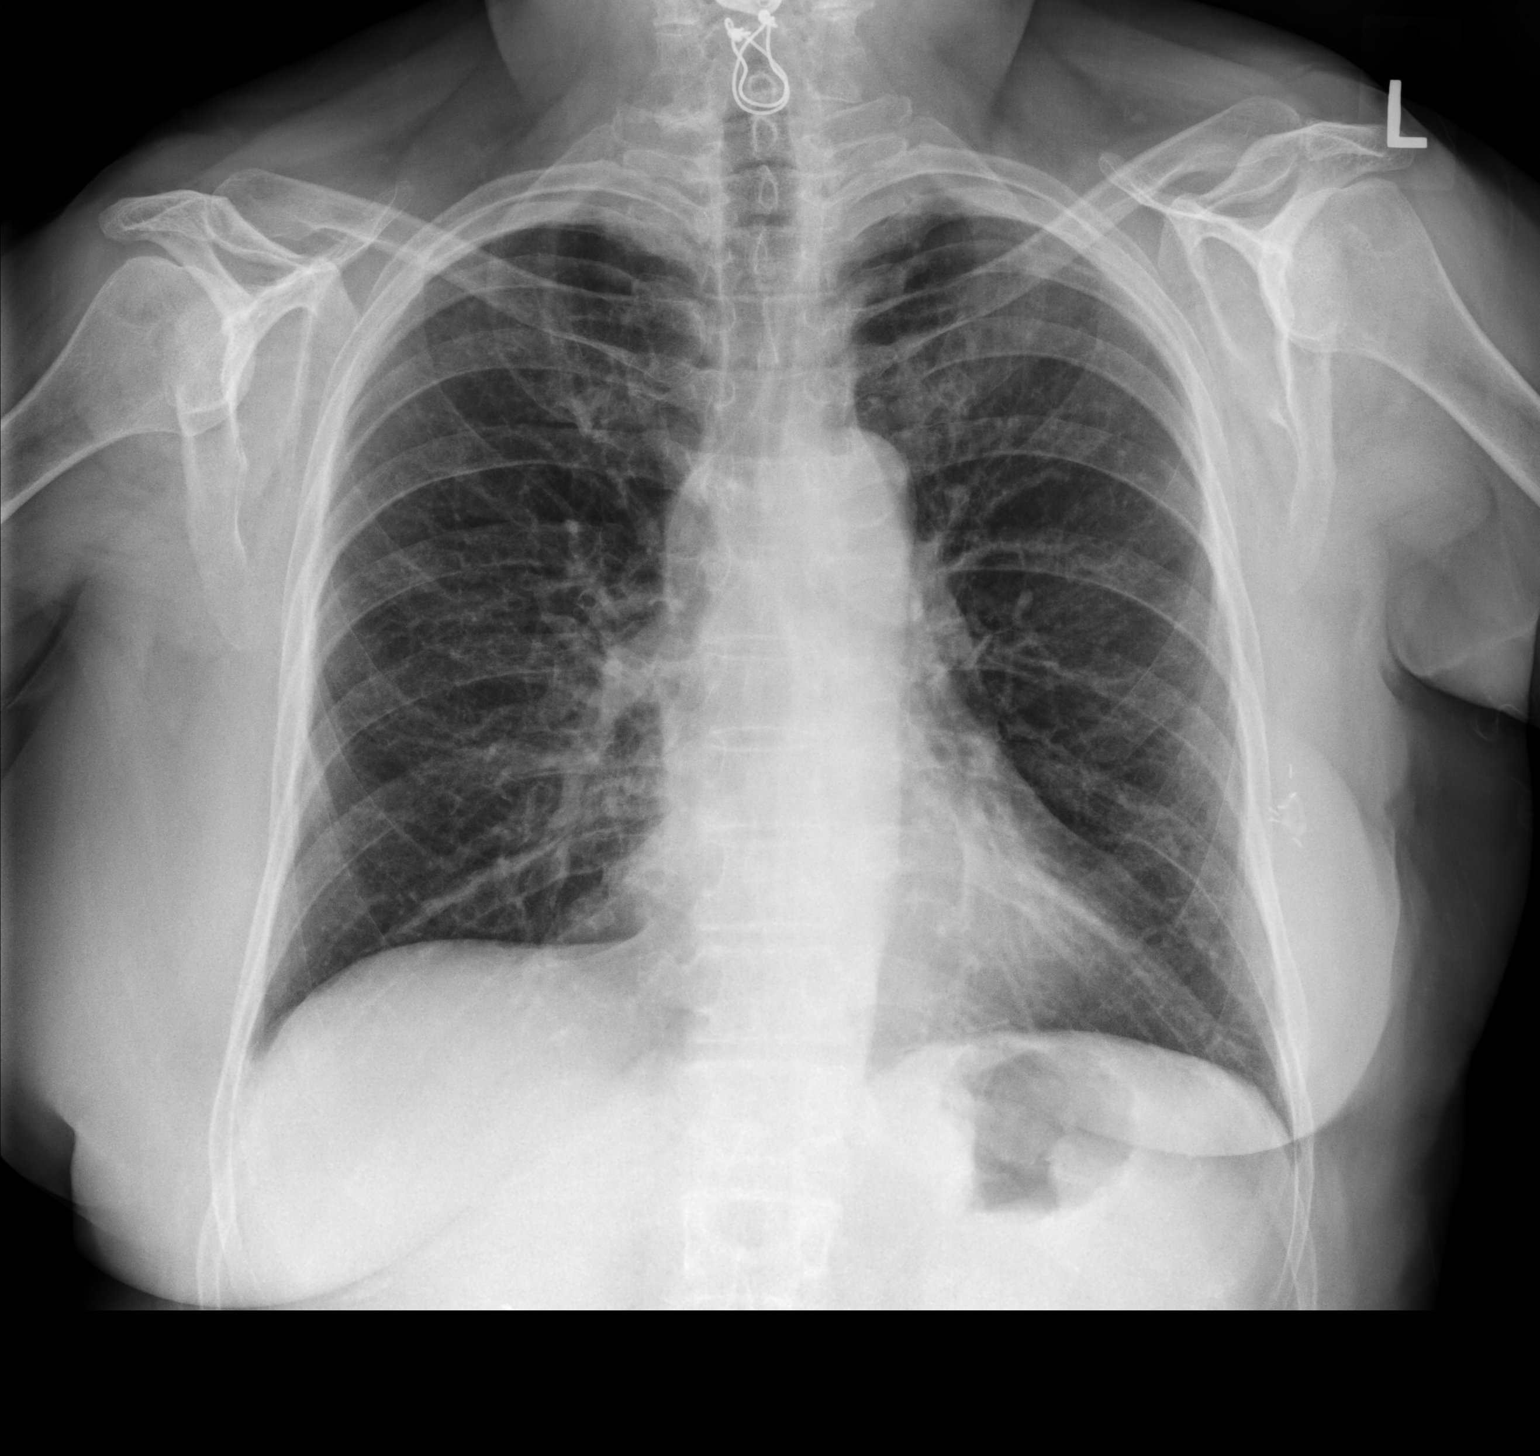

[chest lat]
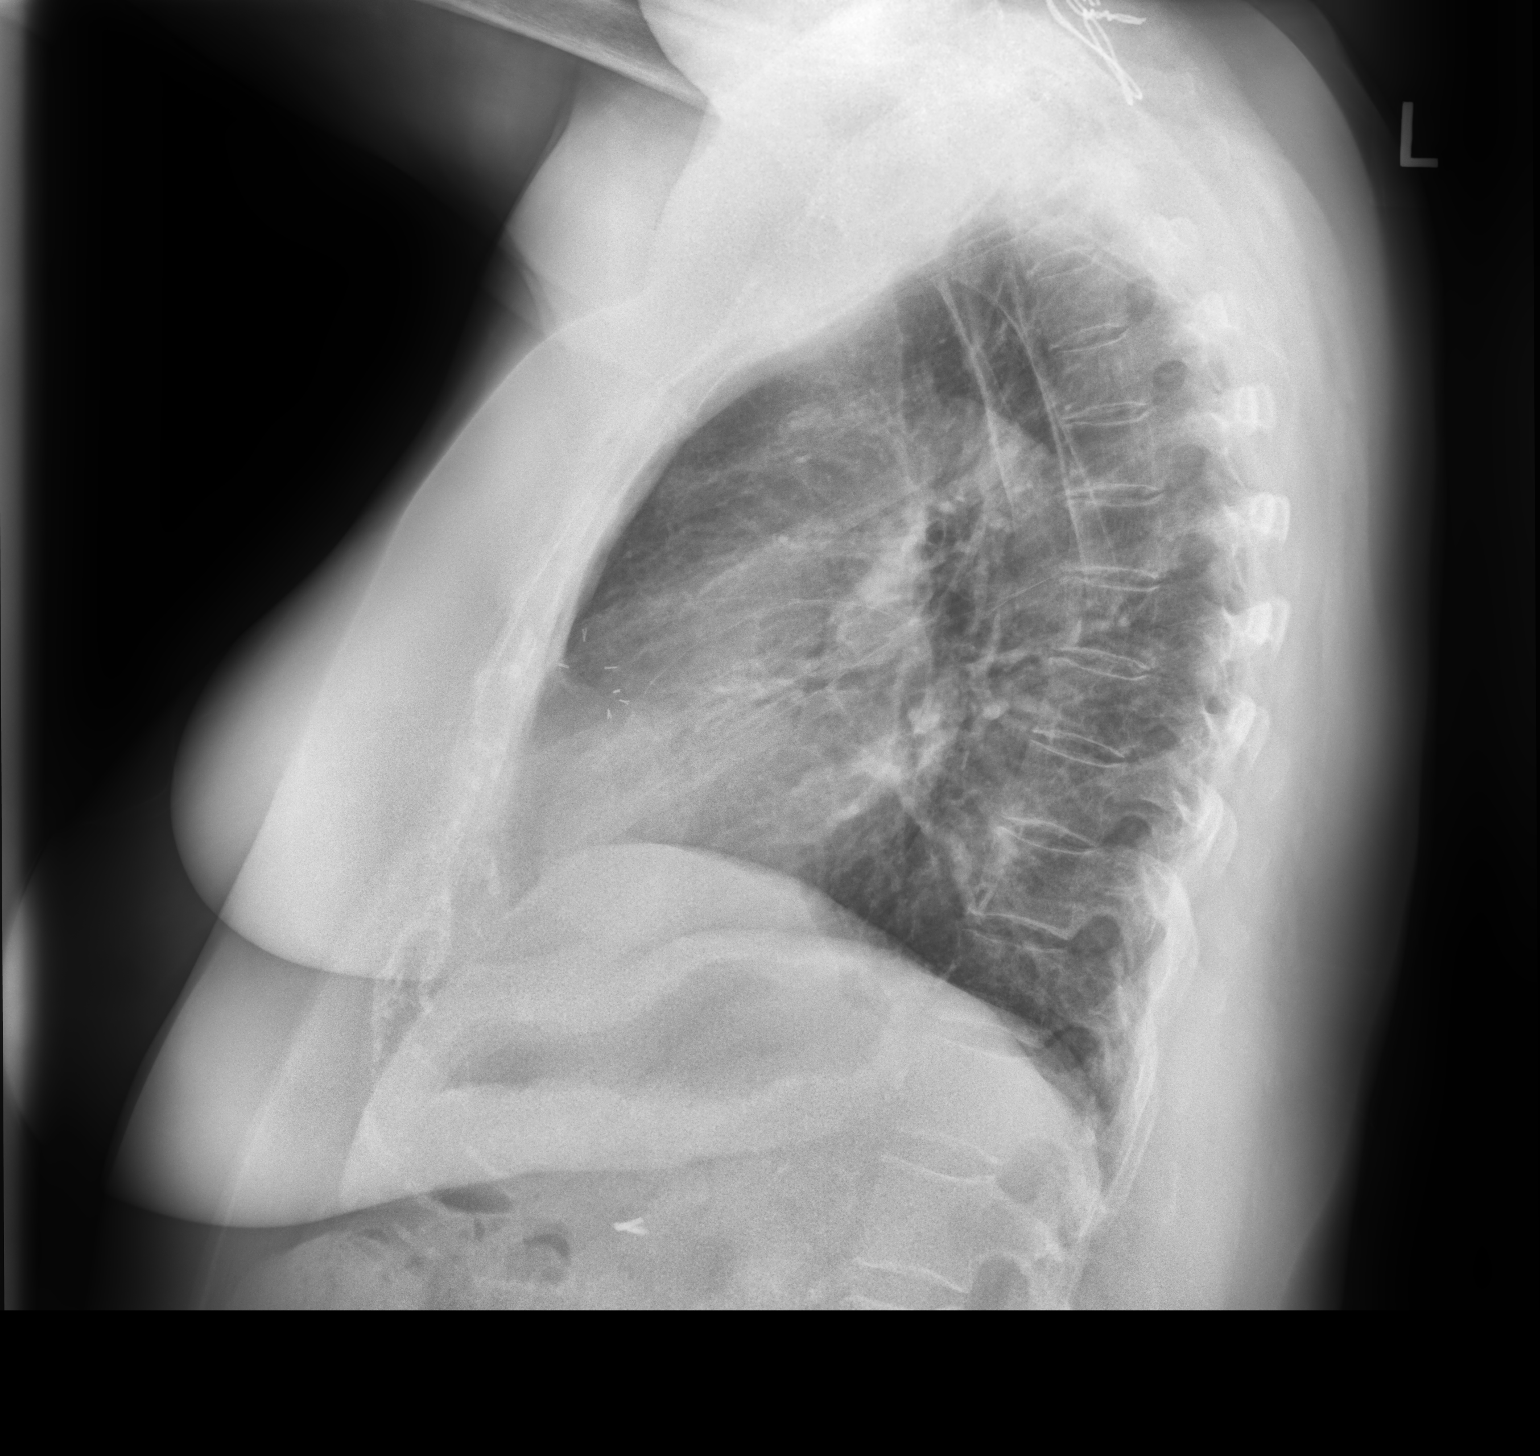

[2 of 2 positions shown; findings below may reference images not displayed]

FINDINGS: Cardiac shadows within normal limits. Lungs are well aerated
bilaterally. No focal infiltrate or sizable effusion is seen. No
acute bony abnormality is noted.
IMPRESSION: No active cardiopulmonary disease.

## 2021-02-14 ENCOUNTER — Other Ambulatory Visit (INDEPENDENT_AMBULATORY_CARE_PROVIDER_SITE_OTHER): Payer: Medicare PPO

## 2021-02-14 ENCOUNTER — Encounter: Payer: Self-pay | Admitting: Family Medicine

## 2021-02-14 ENCOUNTER — Telehealth: Payer: Self-pay | Admitting: Family Medicine

## 2021-02-14 ENCOUNTER — Other Ambulatory Visit: Payer: Self-pay

## 2021-02-14 ENCOUNTER — Telehealth: Payer: Self-pay

## 2021-02-14 ENCOUNTER — Ambulatory Visit (INDEPENDENT_AMBULATORY_CARE_PROVIDER_SITE_OTHER): Payer: Medicare PPO | Admitting: Family Medicine

## 2021-02-14 VITALS — BP 130/68 | HR 82 | Temp 98.1°F | Resp 16 | Ht 61.0 in | Wt 123.2 lb

## 2021-02-14 DIAGNOSIS — D649 Anemia, unspecified: Secondary | ICD-10-CM | POA: Diagnosis not present

## 2021-02-14 DIAGNOSIS — I5020 Unspecified systolic (congestive) heart failure: Secondary | ICD-10-CM | POA: Diagnosis not present

## 2021-02-14 DIAGNOSIS — N1832 Chronic kidney disease, stage 3b: Secondary | ICD-10-CM | POA: Diagnosis not present

## 2021-02-14 DIAGNOSIS — K922 Gastrointestinal hemorrhage, unspecified: Secondary | ICD-10-CM | POA: Diagnosis not present

## 2021-02-14 DIAGNOSIS — E1169 Type 2 diabetes mellitus with other specified complication: Secondary | ICD-10-CM

## 2021-02-14 LAB — CBC WITH DIFFERENTIAL/PLATELET
Basophils Absolute: 0.1 10*3/uL (ref 0.0–0.1)
Basophils Relative: 0.4 % (ref 0.0–3.0)
Eosinophils Absolute: 0.1 10*3/uL (ref 0.0–0.7)
Eosinophils Relative: 0.7 % (ref 0.0–5.0)
HCT: 29.4 % — ABNORMAL LOW (ref 36.0–46.0)
Hemoglobin: 9.1 g/dL — ABNORMAL LOW (ref 12.0–15.0)
Lymphocytes Relative: 6.2 % — ABNORMAL LOW (ref 12.0–46.0)
Lymphs Abs: 0.9 10*3/uL (ref 0.7–4.0)
MCHC: 31 g/dL (ref 30.0–36.0)
MCV: 95.5 fl (ref 78.0–100.0)
Monocytes Absolute: 0.6 10*3/uL (ref 0.1–1.0)
Monocytes Relative: 4.6 % (ref 3.0–12.0)
Neutro Abs: 12 10*3/uL — ABNORMAL HIGH (ref 1.4–7.7)
Neutrophils Relative %: 88.1 % — ABNORMAL HIGH (ref 43.0–77.0)
Platelets: 365 10*3/uL (ref 150.0–400.0)
RBC: 3.08 Mil/uL — ABNORMAL LOW (ref 3.87–5.11)
RDW: 21.4 % — ABNORMAL HIGH (ref 11.5–15.5)
WBC: 13.7 10*3/uL — ABNORMAL HIGH (ref 4.0–10.5)

## 2021-02-14 MED ORDER — PANTOPRAZOLE SODIUM 40 MG PO TBEC: 40.0000 mg | DELAYED_RELEASE_TABLET | Freq: Every day | ORAL | 3 refills | Status: DC

## 2021-02-14 NOTE — Progress Notes (Signed)
Subjective:    Patient ID: Ebony Scott, female    DOB: 03/17/1950, 70 y.o.   MRN: [id number]  HPI  Admit date: 02/04/2021 Discharge date: 02/07/2021   Admitted From: Home Disposition:  Home   Recommendations for Outpatient Follow-up:  Follow up with PCP in 1 week Follow up with GI Follow up with Cardiology Repeat CBC to check hemoglobin in 1 week    Discharge Condition: Stable CODE STATUS: Full  Diet recommendation: Heart healthy diet    Brief/Interim Summary: Ebony Scott is a 70 y.o. female with medical history significant of right hand abscess, history of DVT and PE on Xarelto, allergic rhinitis, chronic blood loss anemia, angioedema, anxiety/depression, osteoarthritis, mild intermittent asthma, cataracts, C. difficile colitis, diverticulosis, GERD, hiatal hernia, hemorrhoids, IBS, colon polyps, fibromyalgia, headaches, hyperlipidemia, hypothyroid, seronegative rheumatoid arthritis, PVD, CAD with recent drug-eluting stent placement on LAD about 3 weeks ago with addition of aspirin and clopidogrel to her medication regimen who is coming to the emergency department with complaints of progressively worse fatigue and somnolence in the last few days setting of worsening chronic anemia.  She went to see her PCP yesterday who asked the patient to come to the emergency department due to a hemoglobin of 6.5 g/dL. She was given blood transfusion and admitted to the hospital. Aspirin/plavix/xarelto held. GI consulted.  Patient underwent EGD 12/18 which revealed gastric antral vascular ectasia, treated with APC.  She was resumed on Plavix.  Patient remained stable with stable hemoglobin count.  Discussed with cardiology, Ebony Scott over the phone, who recommended to stop aspirin but patient should continue on Plavix.  Xarelto was also resumed as patient has history of PE, DVT and is currently on lifelong anticoagulation due to recurrent episode of thromboembolism.   Discharge Diagnoses:   Principal Problem:   Symptomatic anemia Active Problems:   Hyperlipidemia   GERD   Chronic combined systolic and diastolic CHF (congestive heart failure) (HCC)   Chronic kidney disease, stage 3b (HCC)   Chronic pain syndrome   History of pulmonary embolus (PE)   DMII (diabetes mellitus, type 2) (HCC)   History of DVT (deep vein thrombosis)   GAVE (gastric antral vascular ectasia)       Symptomatic anemia with acute on chronic blood loss, secondary to GAVE, iron deficiency anemia -GI consulted, status post EGD 12/18, showed GAVE s/p APC  -Hgb stable today  -IV iron today  -Follow-up with GI outpatient   CAD with DES -Spoke on the phone with Ebony Scott on 12/20.  Okay to stop aspirin.  Continue Plavix   Diabetes mellitus type 2, well controlled  -A1c 5.5  -Resume home jardiance and glipizide   Hyperlipidemia -Continue atorvastatin  Chronic combined systolic and diastolic heart failure -Continue Lasix  CKD stage IIIb -Stable  History of PE, DVT -Resume xarelto    Rheumatoid arthritis -Continue prednisone   02/14/21 Her insurance has refused dexilant.  This is despite the fact the patient was just admitted to the hospital with an upper GI bleed and a hemoglobin of 6.5!  She is not presently on any PPI.  She is taking Xarelto and Plavix.  This is at the recommendation of her cardiologist due to her new drug-eluting stent and also her history of recurrent thromboembolism.  Therefore she is at high risk for recurrent upper GI bleed.  We are working on the prior approval with her insurance company that she is extremely high risk for repeat admission to the hospital.  Therefore  I will place the patient on Protonix immediately and hopefully the delay from her insurance company will not prove dangerous for the patient.  She denies any chest pain however she does report severe fatigue and lethargy.  She had her hemoglobin checked today and it is 9.1.  This is up from 8.7 on the  20th.  Therefore there has been no evidence of any additional drop in her hemoglobin.  She is only taking iron sporadically due to constipation.  In the hospital her hemoglobin A1c was down to 5.5!  She is still taking glipizide as well as Jardiance.  We discussed that today and have decided to discontinue glipizide.  She is also only on 20 mg a day of Lasix.  However today her legs are not swollen.  Her lungs show no evidence of pulmonary edema and she appears euvolemic.  Therefore at the present time 20 mg of Lasix seems sufficient.  She does not demonstrate any anasarca.  She is working with rheumatology and they have weaned her down to 80 mg a day of prednisone for her seronegative rheumatoid arthritis. Past Medical History:  Diagnosis Date   Abscess of dorsum of right hand 09/22/2020   Acute deep vein thrombosis (DVT) of right lower extremity ([place]) 12/13/2017   Allergic rhinitis    Anemia    Angio-edema    Anxiety    pt denies   Arthritis    Phreesia 10/21/2019   Asthma    Phreesia 10/21/2019   Breast cancer ([place]) 1998   in remission   Cataract    REMOVED   Clostridium difficile colitis 01/14/2018   Clostridium difficile diarrhea 53/66/4403   Complication of anesthesia    "had hard time waking up from it several times" (02/20/2012)   Depression    "some; don't take anything for it" (02/20/2012)   Diverticulosis    DVT (deep venous thrombosis) ([place])    Fibromyalgia 11/2011   GERD (gastroesophageal reflux disease)    Graves disease    Headache(784.0)    "related to allergies; more at different times during the year" (02/20/2012)   Hemorrhoids    Hiatal hernia    back and neck   Hx of adenomatous colonic polyps 04/12/2016   Hypercholesteremia    good cholesterol is high   Hypothyroidism    IBS (irritable bowel syndrome)    Moderate persistent asthma    -FeV1 72% 2011, -IgE 102 2011, CT sinus Neg 2011   Osteoporosis    on reclast yearly   Peripheral vascular disease ([place]) 2019    DVTs   Pneumonia 04/2011; ~ 11/2011   "double; single" (02/20/2012)   Septic olecranon bursitis of right elbow 09/22/2020   Seronegative rheumatoid arthritis ([place])    Dr. Lahoma Rocker   SIRS (systemic inflammatory response syndrome) ([place]) 02/10/2018   Tracheobronchomalacia    Past Surgical History:  Procedure Laterality Date   ABDOMINAL HYSTERECTOMY N/A    Phreesia 10/21/2019   ANTERIOR AND POSTERIOR REPAIR  1990's   APPENDECTOMY     ARGON LASER APPLICATION  47/42/5956   Procedure: ARGON LASER APPLICATION;  Surgeon: Yetta Flock, MD;  Location: [facility];  Service: Gastroenterology;;   BREAST LUMPECTOMY  1998   left   BREAST SURGERY N/A    Phreesia 10/21/2019   BRONCHIAL WASHINGS  04/05/2020   Procedure: BRONCHIAL WASHINGS;  Surgeon: Freddi Starr, MD;  Location: [facility];  Service: Pulmonary;;   CARPOMETACARPEL ([place]) FUSION OF THUMB WITH AUTOGRAFT FROM RADIUS  ~  2009   "both thumbs" (02/20/2012)   CATARACT EXTRACTION W/ INTRAOCULAR LENS  IMPLANT, BILATERAL  2012   CERVICAL DISCECTOMY  10/2001   C5-C6   CERVICAL FUSION  2003   C3-C4   CHOLECYSTECTOMY     COLONOSCOPY     CORONARY STENT INTERVENTION N/A 01/11/2021   Procedure: CORONARY STENT INTERVENTION;  Surgeon: Leonie Man, MD;  Location: [facility];  Service: Cardiovascular;  Laterality: N/A;   DEBRIDEMENT TENNIS ELBOW  ?1970's   right   ESOPHAGOGASTRODUODENOSCOPY     ESOPHAGOGASTRODUODENOSCOPY (EGD) WITH PROPOFOL N/A 02/05/2021   Procedure: ESOPHAGOGASTRODUODENOSCOPY (EGD) WITH PROPOFOL;  Surgeon: Yetta Flock, MD;  [facility];  Service: Gastroenterology;  Laterality: N/A;   EYE SURGERY N/A    Phreesia 10/21/2019   HYSTERECTOMY     I & D EXTREMITY Right 09/22/2020   Procedure: IRRIGATION AND DEBRIDEMENT RIGHT HAND AND ELBOW;  Surgeon: Marchia Bond, MD;  Location: [facility];  Service: Orthopedics;  Laterality: Right;   KNEE ARTHROPLASTY  ?1990's   "?right; w/cartilage  repair" (02/20/2012)   NASAL SEPTUM SURGERY  1980's   POSTERIOR CERVICAL FUSION/FORAMINOTOMY  2004   "failed initial fusion; rewired  anterior neck" (02/20/2012)   REVERSE SHOULDER ARTHROPLASTY Right 02/16/2020   Procedure: REVERSE SHOULDER ARTHROPLASTY;  Surgeon: Marchia Bond, MD;  [facility]: [facility];  Service: Orthopedics;  Laterality: Right;   RIGHT/LEFT HEART CATH AND CORONARY ANGIOGRAPHY N/A 01/11/2021   Procedure: RIGHT/LEFT HEART CATH AND CORONARY ANGIOGRAPHY;  Surgeon: Leonie Man, MD;  Location: [facility];  Service: Cardiovascular;  Laterality: N/A;   SPINE SURGERY N/A    Phreesia 10/21/2019   TONSILLECTOMY  ~ 1953   VESICOVAGINAL FISTULA CLOSURE W/ TAH  1988   VIDEO BRONCHOSCOPY Bilateral 08/23/2016   Procedure: VIDEO BRONCHOSCOPY WITH FLUORO;  Surgeon: Javier Glazier, MD;  [facility];  Service: Cardiopulmonary;  Laterality: Bilateral;   VIDEO BRONCHOSCOPY N/A 04/05/2020   Procedure: VIDEO BRONCHOSCOPY WITHOUT FLUORO;  Surgeon: Freddi Starr, MD;  [facility];  Service: Pulmonary;  Laterality: N/A;   Current Outpatient Medications on File Prior to Visit  Medication Sig Dispense Refill   acetaminophen (TYLENOL) 650 MG CR tablet Take 1,300 mg by mouth every 8 (eight) hours as needed for pain.     albuterol (PROVENTIL) (2.5 MG/3ML) 0.083% nebulizer solution Take 3 mLs (2.5 mg total) by nebulization every 4 (four) hours as needed for wheezing or shortness of breath. 75 mL 12   albuterol (VENTOLIN HFA) 108 (90 Base) MCG/ACT inhaler Inhale 2 puffs into the lungs every 6 (six) hours as needed for wheezing or shortness of breath.     allopurinol (ZYLOPRIM) 100 MG tablet Take 1 tablet (100 mg total) by mouth daily. 90 tablet 3   Alpha-D-Galactosidase (BEANO PO) Take 2 tablets by mouth 3 (three) times daily with meals.     Alpha-Lipoic Acid 600 MG TABS Take 600 mg by mouth daily.     atorvastatin (LIPITOR) 80 MG tablet Take 1 tablet (80 mg total) by  mouth daily. 90 tablet 3   azelastine (ASTELIN) 0.1 % nasal spray Use in each nostril as directed (Patient taking differently: 2 sprays daily as needed for allergies.) 30 mL 5   benzonatate (TESSALON) 200 MG capsule Take 1 capsule (200 mg total) by mouth 3 (three) times daily as needed for cough. 30 capsule 0   budesonide (PULMICORT) 0.5 MG/2ML nebulizer solution Take 2 mLs (0.5 mg total) by nebulization 2 (two) times  daily as needed. 120 mL 12   CALCIUM-MAGNESIUM PO Take 1 tablet by mouth every evening.     cetirizine (ZYRTEC) 10 MG tablet Take 1 tablet (10 mg total) by mouth daily. (Patient taking differently: Take 10 mg by mouth at bedtime.) 30 tablet 5   cholecalciferol (VITAMIN D) 25 MCG (1000 UNIT) tablet 1,000 Units every evening.     clopidogrel (PLAVIX) 75 MG tablet Take 1 tablet (75 mg total) by mouth daily. (Patient not taking: Reported on 02/08/2021) 90 tablet 3   dexlansoprazole (DEXILANT) 60 MG capsule Take 1 capsule (60 mg total) by mouth daily. 90 capsule 3   diclofenac Sodium (VOLTAREN) 1 % GEL Apply 1 application topically 4 (four) times daily as needed (pain).     empagliflozin (JARDIANCE) 10 MG TABS tablet Take 1 tablet (10 mg total) by mouth daily before breakfast. 30 tablet 5   EPINEPHrine 0.3 mg/0.3 mL IJ SOAJ injection USE AS DIRECTED BY YOUR PHYSICIAN INTRAMUSCULARLY AS NEEDED FOR ANAPHYLAXIS 2 each 1   famotidine (PEPCID) 20 MG tablet Take 1 tablet (20 mg total) by mouth daily. (Patient taking differently: Take 20 mg by mouth at bedtime.) 30 tablet 5   fluticasone (FLONASE) 50 MCG/ACT nasal spray USE 2 SPRAYS IN EACH NOSTRIL DAILY (Patient taking differently: 2 sprays daily as needed for allergies.) 16 g 2   folic acid (FOLVITE) 1 MG tablet Take 1 tablet (1 mg total) by mouth daily. 90 tablet 3   furosemide (LASIX) 40 MG tablet Take 1 tablet (40 mg total) by mouth daily. Take 1 table twice a day for 3 days and then back to once daily. (Patient taking differently: Take 40 mg  by mouth daily.) 30 tablet 2   glipiZIDE (GLUCOTROL XL) 5 MG 24 hr tablet Take 5 mg by mouth daily with breakfast.     Glucose Blood (BLOOD GLUCOSE TEST STRIPS) STRP Please dispense per patient and insurance preference. Use as directed to monitor FSBS 4x . Dx: E11.65 150 strip 1   guaiFENesin (MUCINEX) 600 MG 12 hr tablet Take 1 tablet (600 mg total) by mouth 2 (two) times daily as needed for cough or to loosen phlegm. (Patient taking differently: Take 600 mg by mouth 2 (two) times daily.) 60 tablet 1   HIZENTRA 2 GM/10ML SOLN Inject 1 Dose into the skin once a week.     Lactase (LACTOSE FAST ACTING RELIEF PO) Take 3 tablets by mouth in the morning, at noon, and at bedtime.     magnesium oxide (MAG-OX) 400 MG tablet Take 400 mg by mouth every evening.     montelukast (SINGULAIR) 10 MG tablet Take 1 tablet (10 mg total) by mouth daily. (Patient taking differently: Take 10 mg by mouth every evening.) 30 tablet 5   Nystatin (GERHARDT'S BUTT CREAM) CREA Apply topically 3 times a day as needed for itching or irritation 4 each 6   nystatin (MYCOSTATIN) 100000 UNIT/ML suspension Use as directed 15 mLs in the mouth or throat at bedtime as needed (mouth sores).     nystatin ointment (MYCOSTATIN) Apply 1 application topically 2 (two) times daily as needed (yeast).     predniSONE (DELTASONE) 1 MG tablet Take 3 mg by mouth as directed. Take along with 5 mg tablet=8 mg     Prenatal Vit-Fe Fumarate-FA (PRENATAL VITAMINS PO) Take 1 tablet by mouth daily.     PRESCRIPTION MEDICATION Place 4 drops into both ears See admin instructions. Chloramp- sulfu - ampho d-h droc 100-100-5 hydrocortisone, otic  ear drops. Instill 4 drops in to each ear daily for the first 5 days of each month     Probiotic Product (ALIGN PO) Take 1 capsule by mouth daily.     Propylene Glycol (SYSTANE COMPLETE) 0.6 % SOLN Place 1 drop into both eyes 2 (two) times daily.     Tiotropium Bromide-Olodaterol (STIOLTO RESPIMAT) 2.5-2.5 MCG/ACT AERS  Inhale 2 puffs into the lungs daily. 4 g 5   traMADol (ULTRAM) 50 MG tablet Take 1 tablet (50 mg total) by mouth at bedtime as needed. 30 tablet 1   XARELTO 20 MG TABS tablet TAKE 1 TABLET BY MOUTH DAILY WITH SUPPER 90 tablet 3   [DISCONTINUED] potassium chloride SA (KLOR-CON) 20 MEQ tablet Take 1 tablet (20 mEq total) by mouth daily. (Patient not taking: No sig reported) 30 tablet 3   Current Facility-Administered Medications on File Prior to Visit  Medication Dose Route Frequency Provider Last Rate Last Admin   denosumab (PROLIA) injection 60 mg  60 mg Subcutaneous Q6 months [name], MD   60 mg at 12/05/20 1201   iohexol (OMNIPAQUE) 350 MG/ML injection    PRN Leonie Man, MD   105 mL at 01/11/21 0955   Allergies  Allergen Reactions   Baclofen Other (See Comments)    Altered mental status requiring a 3-day hospital stay- patient became unresponsive   Dust Mite Extract Shortness Of Breath and Other (See Comments)    "sneezing" (02/20/2012)   Molds & Smuts Shortness Of Breath   Morphine And Related Hives and Itching   Other Shortness Of Breath and Other (See Comments)    Grass and weeds "sneezing; filled sinuses" (02/20/2012)   Penicillins Rash and Other (See Comments)    "welts" (02/20/2012) Tolerated Ancef 02/16/20.    Rofecoxib Swelling and Other (See Comments)    Vioxx- feet swelling    Shellfish Allergy Anaphylaxis, Shortness Of Breath, Itching and Rash   Tetracycline Hcl Nausea And Vomiting   Xolair [Omalizumab] Other (See Comments)    Caused Blood clot   Zoledronic Acid Other (See Comments)    Fever, Put in hospital, dr said it was a reaction from a reaction  Other reaction(s): Unknown   Dilaudid [Hydromorphone Hcl] Itching   Levofloxacin Other (See Comments)    GI upset   Oxycodone Hcl Nausea And Vomiting   Paroxetine Nausea And Vomiting and Other (See Comments)    Paxil    Celecoxib Swelling and Other (See Comments)    Feet swelling   Diltiazem Hcl  Swelling   Hydrocodone-Acetaminophen Other (See Comments)    Unknown reaction   Lactose Intolerance (Gi) Other (See Comments)    Bloating and gas   Morphine Sulfate Other (See Comments)    "Gets loopy"   Oxycodone-Acetaminophen Other (See Comments)    Unknown reaction   Rituximab     Anemia    Tree Extract Other (See Comments)    "tested and told I was allergic to it; never experienced a reaction to it" (02/20/2012)   Penicillin G Procaine Rash and Other (See Comments)    Welts, also   Social History   Socioeconomic History   Marital status: Divorced    Spouse name: Not on file   Number of children: 2   Years of education: college   Highest education level: Not on file  Occupational History   Occupation: Disabled    Comment: Retired Print production planner    Employer: RETIRED  Tobacco Use  Smoking status: Never    Passive exposure: Yes   Smokeless tobacco: Never   Tobacco comments:    Parents  Vaping Use   Vaping Use: Never used  Substance and Sexual Activity   Alcohol use: Not Currently    Alcohol/week: 0.0 standard drinks   Drug use: No   Sexual activity: Never  Other Topics Concern   Not on file  Social History Narrative   Patient lives at home alone. Patient  divorced.    Patient has her BS degree.   Right handed.   Caffeine- sometimes coffee.      [facility]:   Born in [place], [place]. She worked as a Copywriter, advertising. She has no pets currently. She does have indoor plants. Previously had mold in her home that was remediated. Carpet was removed.          Social Determinants of Health   Financial Resource Strain: Low Risk    Difficulty of Paying Living Expenses: Not hard at all  Food Insecurity: No Food Insecurity   Worried About Charity fundraiser in the Last Year: Never true   [place] in the Last Year: Never true  Transportation Needs: No Transportation Needs   Lack of Transportation (Medical): No   Lack of Transportation  (Non-Medical): No  Physical Activity: Inactive   Days of Exercise per Week: 0 days   Minutes of Exercise per Session: 0 min  Stress: No Stress Concern Present   Feeling of Stress : Not at all  Social Connections: Moderately Isolated   Frequency of Communication with Friends and Family: More than three times a week   Frequency of Social Gatherings with Friends and Family: More than three times a week   Attends Religious Services: Never   Marine scientist or Organizations: Yes   Attends Archivist Meetings: Never   Marital Status: Divorced  Human resources officer Violence: Not At Risk   Fear of Current or Ex-Partner: No   Emotionally Abused: No   Physically Abused: No   Sexually Abused: No     Review of Systems     Objective:   Physical Exam Vitals reviewed.  Constitutional:      General: She is not in acute distress.    Appearance: Normal appearance. She is not ill-appearing or toxic-appearing.  HENT:     Right Ear: Tympanic membrane and ear canal normal.     Left Ear: Tympanic membrane and ear canal normal.     Nose: No congestion or rhinorrhea.  Cardiovascular:     Rate and Rhythm: Normal rate and regular rhythm.     Heart sounds: Normal heart sounds.  Pulmonary:     Effort: Pulmonary effort is normal. No respiratory distress.     Breath sounds: No stridor or decreased air movement. Wheezing present. No rales.  Abdominal:     General: Bowel sounds are normal. There is no distension.     Palpations: Abdomen is soft.     Tenderness: There is no abdominal tenderness. There is no guarding.  Musculoskeletal:     Right lower leg: No edema.     Left lower leg: No edema.  Skin:    Findings: No erythema.  Neurological:     Mental Status: She is alert and oriented to person, place, and time.     Cranial Nerves: No cranial nerve deficit.     Sensory: No sensory deficit.     Motor: No weakness.  Coordination: Coordination normal.        Assessment & Plan:   Anemia, unspecified type - Plan: CBC with Differential/Platelet, COMPLETE METABOLIC PANEL WITH GFR  Type 2 diabetes mellitus with other specified complication, unspecified whether long term insulin use ([place]) - Plan: CBC with Differential/Platelet, COMPLETE METABOLIC PANEL WITH GFR  Stage 3b chronic kidney disease ([place]) - Plan: CBC with Differential/Platelet, COMPLETE METABOLIC PANEL WITH GFR  HFrEF (heart failure with reduced ejection fraction) (HCC)  Upper GI bleed Thankfully her hemoglobin remained stable and is up to 9.1 showing no evidence of ongoing bleeding after cautery.  However I want the patient to stay on a PPI indefinitely.  Until her insurance hopefully approves dexilant,I will start her on Protonix 40 mg a day.  This is to avoid any interaction with Plavix and to prevent recurrent GI bleeding.  I would like to recheck her hemoglobin in 2 weeks.  If there is any drop in her hemoglobin I will plan to discontinue Xarelto and keep the patient only on Plavix.  Her blood sugar is outstanding so have asked her to stop glipizide but continue Jardiance due to her reduced ejection fraction heart failure.  Today she shows no evidence of edema so I will also have her reduce her Lasix to 20 mg a day.

## 2021-02-14 NOTE — Telephone Encounter (Signed)
Patient and insurance called to clarify if patient was to be taking dexilant and pantoprazole.  Per note patient was no longer taking pantoprazole and should continue dexilant.  Patient will reach out to pharmacy.

## 2021-02-14 NOTE — Telephone Encounter (Signed)
[employer] called to complete PA on Dexilant. Clinical questions answered. The info is being sent for clinical review and they will notify us via fax of the outcome.

## 2021-02-14 NOTE — Telephone Encounter (Signed)
Please see other TE dated 02/14/21 for more information.

## 2021-02-14 NOTE — Telephone Encounter (Signed)
Patient spoke with insurance [employer] regarding prior auth for dexilant; call them at [phone] and use reference #[id number].  Please advise patient at [phone].

## 2021-02-15 ENCOUNTER — Telehealth: Payer: Self-pay | Admitting: Family Medicine

## 2021-02-15 NOTE — Telephone Encounter (Signed)
Per [facility] PA for Dexilant has been APPROVED through 02/18/2022. Pharmacy has been notified.

## 2021-02-15 NOTE — Telephone Encounter (Signed)
Patient called to ask if she can take her infusion medication HIZENTRA 2 GM/10ML SOLN [[id number]]  for the anemia since hgb increased to 9.4.  Please advise at [phone].

## 2021-02-16 ENCOUNTER — Telehealth: Payer: Self-pay

## 2021-02-16 NOTE — Telephone Encounter (Signed)
Pt called in wanting to ask pcp if she should take her infusion treatment for her anemia? Pt states that she knows it lowers her blood count. Pt would like to know this info asap. Please advise.  Cb#: [phone]

## 2021-02-17 ENCOUNTER — Telehealth: Payer: Self-pay

## 2021-02-17 DIAGNOSIS — Z01419 Encounter for gynecological examination (general) (routine) without abnormal findings: Secondary | ICD-10-CM | POA: Diagnosis not present

## 2021-02-17 DIAGNOSIS — Z01411 Encounter for gynecological examination (general) (routine) with abnormal findings: Secondary | ICD-10-CM | POA: Diagnosis not present

## 2021-02-17 DIAGNOSIS — Z853 Personal history of malignant neoplasm of breast: Secondary | ICD-10-CM | POA: Diagnosis not present

## 2021-02-17 DIAGNOSIS — Z124 Encounter for screening for malignant neoplasm of cervix: Secondary | ICD-10-CM | POA: Diagnosis not present

## 2021-02-17 DIAGNOSIS — Z1231 Encounter for screening mammogram for malignant neoplasm of breast: Secondary | ICD-10-CM | POA: Diagnosis not present

## 2021-02-17 DIAGNOSIS — Z6823 Body mass index (BMI) 23.0-23.9, adult: Secondary | ICD-10-CM | POA: Diagnosis not present

## 2021-02-17 NOTE — Telephone Encounter (Signed)
Informed patient that Dr Dennard Schaumann has no issues with her taking this infusion based on her hemoglobin.

## 2021-02-17 NOTE — Telephone Encounter (Signed)
Tish Physical Therapist with [facility] called in to obtain verbal orders to continue PT once a week for 2 more weeks with plans to recertify pt's plan of care. Please call Physical Therapist back at:  Cb#: [phone]

## 2021-02-17 NOTE — Telephone Encounter (Signed)
Verbal order given - will fax if needs to be signed by provider

## 2021-02-18 ENCOUNTER — Other Ambulatory Visit: Payer: Self-pay | Admitting: Family Medicine

## 2021-02-18 DIAGNOSIS — J479 Bronchiectasis, uncomplicated: Secondary | ICD-10-CM | POA: Diagnosis not present

## 2021-02-18 DIAGNOSIS — E1151 Type 2 diabetes mellitus with diabetic peripheral angiopathy without gangrene: Secondary | ICD-10-CM | POA: Diagnosis not present

## 2021-02-18 DIAGNOSIS — E1122 Type 2 diabetes mellitus with diabetic chronic kidney disease: Secondary | ICD-10-CM | POA: Diagnosis not present

## 2021-02-18 DIAGNOSIS — I5042 Chronic combined systolic (congestive) and diastolic (congestive) heart failure: Secondary | ICD-10-CM | POA: Diagnosis not present

## 2021-02-18 DIAGNOSIS — N1832 Chronic kidney disease, stage 3b: Secondary | ICD-10-CM | POA: Diagnosis not present

## 2021-02-18 DIAGNOSIS — M71121 Other infective bursitis, right elbow: Secondary | ICD-10-CM | POA: Diagnosis not present

## 2021-02-18 DIAGNOSIS — F32A Depression, unspecified: Secondary | ICD-10-CM | POA: Diagnosis not present

## 2021-02-18 DIAGNOSIS — K219 Gastro-esophageal reflux disease without esophagitis: Secondary | ICD-10-CM | POA: Diagnosis not present

## 2021-02-18 DIAGNOSIS — M069 Rheumatoid arthritis, unspecified: Secondary | ICD-10-CM | POA: Diagnosis not present

## 2021-02-19 DIAGNOSIS — Z9289 Personal history of other medical treatment: Secondary | ICD-10-CM

## 2021-02-19 HISTORY — DX: Personal history of other medical treatment: Z92.89

## 2021-02-22 DIAGNOSIS — M71121 Other infective bursitis, right elbow: Secondary | ICD-10-CM | POA: Diagnosis not present

## 2021-02-22 DIAGNOSIS — K219 Gastro-esophageal reflux disease without esophagitis: Secondary | ICD-10-CM | POA: Diagnosis not present

## 2021-02-22 DIAGNOSIS — E1151 Type 2 diabetes mellitus with diabetic peripheral angiopathy without gangrene: Secondary | ICD-10-CM | POA: Diagnosis not present

## 2021-02-22 DIAGNOSIS — E1122 Type 2 diabetes mellitus with diabetic chronic kidney disease: Secondary | ICD-10-CM | POA: Diagnosis not present

## 2021-02-22 DIAGNOSIS — I5042 Chronic combined systolic (congestive) and diastolic (congestive) heart failure: Secondary | ICD-10-CM | POA: Diagnosis not present

## 2021-02-22 DIAGNOSIS — J479 Bronchiectasis, uncomplicated: Secondary | ICD-10-CM | POA: Diagnosis not present

## 2021-02-22 DIAGNOSIS — N1832 Chronic kidney disease, stage 3b: Secondary | ICD-10-CM | POA: Diagnosis not present

## 2021-02-22 DIAGNOSIS — M069 Rheumatoid arthritis, unspecified: Secondary | ICD-10-CM | POA: Diagnosis not present

## 2021-02-22 DIAGNOSIS — F32A Depression, unspecified: Secondary | ICD-10-CM | POA: Diagnosis not present

## 2021-02-23 ENCOUNTER — Encounter (HOSPITAL_BASED_OUTPATIENT_CLINIC_OR_DEPARTMENT_OTHER): Payer: Self-pay

## 2021-02-23 ENCOUNTER — Ambulatory Visit: Payer: Medicare PPO | Admitting: Gastroenterology

## 2021-02-24 ENCOUNTER — Other Ambulatory Visit (INDEPENDENT_AMBULATORY_CARE_PROVIDER_SITE_OTHER): Payer: Medicare PPO

## 2021-02-24 ENCOUNTER — Other Ambulatory Visit: Payer: Self-pay

## 2021-02-24 ENCOUNTER — Ambulatory Visit: Payer: Medicare PPO | Admitting: Gastroenterology

## 2021-02-24 ENCOUNTER — Encounter: Payer: Self-pay | Admitting: Gastroenterology

## 2021-02-24 ENCOUNTER — Encounter (HOSPITAL_BASED_OUTPATIENT_CLINIC_OR_DEPARTMENT_OTHER): Payer: Self-pay | Admitting: Family

## 2021-02-24 ENCOUNTER — Encounter: Payer: Self-pay | Admitting: Family Medicine

## 2021-02-24 ENCOUNTER — Ambulatory Visit: Payer: Medicare PPO | Admitting: Family Medicine

## 2021-02-24 ENCOUNTER — Ambulatory Visit (HOSPITAL_BASED_OUTPATIENT_CLINIC_OR_DEPARTMENT_OTHER): Payer: Medicare PPO | Admitting: Family

## 2021-02-24 VITALS — BP 134/50 | HR 80 | Ht 60.0 in | Wt 128.0 lb

## 2021-02-24 VITALS — BP 118/58 | HR 74 | Ht 61.0 in | Wt 128.0 lb

## 2021-02-24 VITALS — BP 110/58 | HR 81 | Ht 61.0 in | Wt 128.4 lb

## 2021-02-24 DIAGNOSIS — I25118 Atherosclerotic heart disease of native coronary artery with other forms of angina pectoris: Secondary | ICD-10-CM

## 2021-02-24 DIAGNOSIS — D509 Iron deficiency anemia, unspecified: Secondary | ICD-10-CM

## 2021-02-24 DIAGNOSIS — N1832 Chronic kidney disease, stage 3b: Secondary | ICD-10-CM

## 2021-02-24 DIAGNOSIS — N1831 Chronic kidney disease, stage 3a: Secondary | ICD-10-CM

## 2021-02-24 DIAGNOSIS — D649 Anemia, unspecified: Secondary | ICD-10-CM | POA: Diagnosis not present

## 2021-02-24 DIAGNOSIS — E785 Hyperlipidemia, unspecified: Secondary | ICD-10-CM | POA: Diagnosis not present

## 2021-02-24 DIAGNOSIS — I5020 Unspecified systolic (congestive) heart failure: Secondary | ICD-10-CM

## 2021-02-24 DIAGNOSIS — E876 Hypokalemia: Secondary | ICD-10-CM | POA: Diagnosis not present

## 2021-02-24 DIAGNOSIS — K31819 Angiodysplasia of stomach and duodenum without bleeding: Secondary | ICD-10-CM

## 2021-02-24 DIAGNOSIS — I5022 Chronic systolic (congestive) heart failure: Secondary | ICD-10-CM | POA: Diagnosis not present

## 2021-02-24 LAB — IBC + FERRITIN
Ferritin: 74.6 ng/mL (ref 10.0–291.0)
Iron: 14 ug/dL — ABNORMAL LOW (ref 42–145)
Saturation Ratios: 3.4 % — ABNORMAL LOW (ref 20.0–50.0)
TIBC: 413 ug/dL (ref 250.0–450.0)
Transferrin: 295 mg/dL (ref 212.0–360.0)

## 2021-02-24 NOTE — Progress Notes (Signed)
02/24/2021 Ebony Scott [id number] March 07, 1950   HISTORY OF PRESENT ILLNESS: This is a 71 year old female who is a patient of Dr. Celesta Aver.  She is here for recent hospital follow-up.  She was hospitalized in December at which time our service saw her for evaluation of anemia.  She is on chronic prednisone for rheumatoid arthritis.  She is also on Xarelto for history of DVT.  She actually just had a cardiac stent placed in November at which time Plavix was added to her regimen as well.  She had progressive anemia with hemoglobin down to 6.3 g.  She was given 1 unit of packed red blood cells.  Iron studies were low as well and she was given IV iron infusion.  She says that she cannot really tolerate oral iron supplements well as they make her nauseous, etc.  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  Once again, she is here today for follow-up.  She just had a CBC and CMP drawn by her PCP this morning and those labs have not yet resulted.  She says that her stools are normal color.  She continues on her Dexilant 60 mg daily.  She says that she still feels fatigued.  She would like to hold off on colonoscopy for now.   Past Medical History:  Diagnosis Date   Abscess of dorsum of right hand 09/22/2020   Acute deep vein thrombosis (DVT) of right lower extremity ([place]) 12/13/2017   Allergic rhinitis    Anemia    Angio-edema    Anxiety    pt denies   Arthritis    Phreesia 10/21/2019   Asthma    Phreesia 10/21/2019   Breast cancer ([place]) 1998   in remission   Cataract    REMOVED   Clostridium difficile colitis 01/14/2018   Clostridium difficile diarrhea 83/41/9622   Complication of anesthesia    "had hard time waking up from it several times" (02/20/2012)   Depression    "some; don't take anything for it" (02/20/2012)   Diverticulosis    DVT (deep venous thrombosis) ([place])    Fibromyalgia 11/2011   GERD (gastroesophageal reflux disease)    Graves disease     Headache(784.0)    "related to allergies; more at different times during the year" (02/20/2012)   Hemorrhoids    Hiatal hernia    back and neck   Hx of adenomatous colonic polyps 04/12/2016   Hypercholesteremia    good cholesterol is high   Hypothyroidism    IBS (irritable bowel syndrome)    Moderate persistent asthma    -FeV1 72% 2011, -IgE 102 2011, CT sinus Neg 2011   Osteoporosis    on reclast yearly   Peripheral vascular disease ([place]) 2019   DVTs   Pneumonia 04/2011; ~ 11/2011   "double; single" (02/20/2012)   Septic olecranon bursitis of right elbow 09/22/2020   Seronegative rheumatoid arthritis ([place])    Dr. Lahoma Rocker   SIRS (systemic inflammatory response syndrome) ([place]) 02/10/2018   Tracheobronchomalacia    Past Surgical History:  Procedure Laterality Date   ABDOMINAL HYSTERECTOMY N/A    Phreesia 10/21/2019   ANTERIOR AND POSTERIOR REPAIR  1990's   APPENDECTOMY     ARGON LASER APPLICATION  29/79/8921   Procedure: ARGON LASER APPLICATION;  Surgeon: Yetta Flock, MD;  Location: [facility];  Service: Gastroenterology;;   BREAST LUMPECTOMY  1998   left   BREAST SURGERY N/A  Phreesia 10/21/2019   BRONCHIAL WASHINGS  04/05/2020   Procedure: BRONCHIAL WASHINGS;  Surgeon: Freddi Starr, MD;  Location: [facility];  Service: Pulmonary;;   CARPOMETACARPEL ([place]) FUSION OF THUMB WITH AUTOGRAFT FROM RADIUS  ~ 2009   "both thumbs" (02/20/2012)   CATARACT EXTRACTION W/ INTRAOCULAR LENS  IMPLANT, BILATERAL  2012   CERVICAL DISCECTOMY  10/2001   C5-C6   CERVICAL FUSION  2003   C3-C4   CHOLECYSTECTOMY     COLONOSCOPY     CORONARY STENT INTERVENTION N/A 01/11/2021   Procedure: CORONARY STENT INTERVENTION;  Surgeon: Leonie Man, MD;  Location: [place] [facility];  Service: Cardiovascular;  Laterality: N/A;   DEBRIDEMENT TENNIS ELBOW  ?1970's   right   ESOPHAGOGASTRODUODENOSCOPY     ESOPHAGOGASTRODUODENOSCOPY (EGD) WITH PROPOFOL N/A 02/05/2021    Procedure: ESOPHAGOGASTRODUODENOSCOPY (EGD) WITH PROPOFOL;  Surgeon: Yetta Flock, MD;  [facility]: [facility];  Service: Gastroenterology;  Laterality: N/A;   EYE SURGERY N/A    Phreesia 10/21/2019   HYSTERECTOMY     I & D EXTREMITY Right 09/22/2020   Procedure: IRRIGATION AND DEBRIDEMENT RIGHT HAND AND ELBOW;  Surgeon: Marchia Bond, MD;  Location: [facility];  Service: Orthopedics;  Laterality: Right;   KNEE ARTHROPLASTY  ?1990's   "?right; w/cartilage repair" (02/20/2012)   NASAL SEPTUM SURGERY  1980's   POSTERIOR CERVICAL FUSION/FORAMINOTOMY  2004   "failed initial fusion; rewired  anterior neck" (02/20/2012)   REVERSE SHOULDER ARTHROPLASTY Right 02/16/2020   Procedure: REVERSE SHOULDER ARTHROPLASTY;  Surgeon: Marchia Bond, MD;  Location: [facility];  Service: Orthopedics;  Laterality: Right;   RIGHT/LEFT HEART CATH AND CORONARY ANGIOGRAPHY N/A 01/11/2021   Procedure: RIGHT/LEFT HEART CATH AND CORONARY ANGIOGRAPHY;  Surgeon: Leonie Man, MD;  Location: [place] [facility];  Service: Cardiovascular;  Laterality: N/A;   SPINE SURGERY N/A    Phreesia 10/21/2019   TONSILLECTOMY  ~ 1953   VESICOVAGINAL FISTULA CLOSURE W/ TAH  1988   VIDEO BRONCHOSCOPY Bilateral 08/23/2016   Procedure: VIDEO BRONCHOSCOPY WITH FLUORO;  Surgeon: Javier Glazier, MD;  [facility];  Service: Cardiopulmonary;  Laterality: Bilateral;   VIDEO BRONCHOSCOPY N/A 04/05/2020   Procedure: VIDEO BRONCHOSCOPY WITHOUT FLUORO;  Surgeon: Freddi Starr, MD;  [facility];  Service: Pulmonary;  Laterality: N/A;    reports that she has never smoked. She has been exposed to tobacco smoke. She has never used smokeless tobacco. She reports that she does not currently use alcohol. She reports that she does not use drugs. family history includes Allergies in her father and mother; Arthritis in her father and mother; Colitis in her daughter; Colon cancer in an other family member; Diabetes in her maternal  grandfather and mother; Heart disease in her brother, father, and mother; Lung cancer in her mother; Stroke in her father. Allergies  Allergen Reactions   Baclofen Other (See Comments)    Altered mental status requiring a 3-day hospital stay- patient became unresponsive   Dust Mite Extract Shortness Of Breath and Other (See Comments)    "sneezing" (02/20/2012)   Molds & Smuts Shortness Of Breath   Morphine And Related Hives and Itching   Other Shortness Of Breath and Other (See Comments)    Grass and weeds "sneezing; filled sinuses" (02/20/2012)   Penicillins Rash and Other (See Comments)    "welts" (02/20/2012) Tolerated Ancef 02/16/20.    Rofecoxib Swelling and Other (See Comments)    Vioxx- feet swelling    Shellfish Allergy Anaphylaxis, Shortness  Of Breath, Itching and Rash   Tetracycline Hcl Nausea And Vomiting   Xolair [Omalizumab] Other (See Comments)    Caused Blood clot   Zoledronic Acid Other (See Comments)    Fever, Put in hospital, dr said it was a reaction from a reaction  Other reaction(s): Unknown   Dilaudid [Hydromorphone Hcl] Itching   Levofloxacin Other (See Comments)    GI upset   Oxycodone Hcl Nausea And Vomiting   Paroxetine Nausea And Vomiting and Other (See Comments)    Paxil    Celecoxib Swelling and Other (See Comments)    Feet swelling   Diltiazem Hcl Swelling   Hydrocodone-Acetaminophen Other (See Comments)    Unknown reaction   Lactose Intolerance (Gi) Other (See Comments)    Bloating and gas   Morphine Sulfate Other (See Comments)    "Gets loopy"   Oxycodone-Acetaminophen Other (See Comments)    Unknown reaction   Rituximab     Anemia    Tree Extract Other (See Comments)    "tested and told I was allergic to it; never experienced a reaction to it" (02/20/2012)   Penicillin G Procaine Rash and Other (See Comments)    Welts, also      Outpatient Encounter Medications as of 02/24/2021  Medication Sig   acetaminophen (TYLENOL) 650 MG CR tablet  Take 1,300 mg by mouth every 8 (eight) hours as needed for pain.   albuterol (PROVENTIL) (2.5 MG/3ML) 0.083% nebulizer solution Take 3 mLs (2.5 mg total) by nebulization every 4 (four) hours as needed for wheezing or shortness of breath.   albuterol (VENTOLIN HFA) 108 (90 Base) MCG/ACT inhaler Inhale 2 puffs into the lungs every 6 (six) hours as needed for wheezing or shortness of breath.   allopurinol (ZYLOPRIM) 100 MG tablet Take 1 tablet (100 mg total) by mouth daily.   Alpha-D-Galactosidase (BEANO PO) Take 2 tablets by mouth 3 (three) times daily with meals.   Alpha-Lipoic Acid 600 MG TABS Take 600 mg by mouth daily.   atorvastatin (LIPITOR) 80 MG tablet Take 1 tablet (80 mg total) by mouth daily.   azelastine (ASTELIN) 0.1 % nasal spray Use in each nostril as directed (Patient taking differently: 2 sprays daily as needed for allergies.)   benzonatate (TESSALON) 200 MG capsule Take 1 capsule (200 mg total) by mouth 3 (three) times daily as needed for cough.   budesonide (PULMICORT) 0.5 MG/2ML nebulizer solution Take 2 mLs (0.5 mg total) by nebulization 2 (two) times daily as needed.   CALCIUM-MAGNESIUM PO Take 1 tablet by mouth every evening.   cetirizine (ZYRTEC) 10 MG tablet Take 1 tablet (10 mg total) by mouth daily. (Patient taking differently: Take 10 mg by mouth at bedtime.)   cholecalciferol (VITAMIN D) 25 MCG (1000 UNIT) tablet 1,000 Units every evening.   clopidogrel (PLAVIX) 75 MG tablet Take 1 tablet (75 mg total) by mouth daily.   dexlansoprazole (DEXILANT) 60 MG capsule Take 1 capsule (60 mg total) by mouth daily.   diclofenac Sodium (VOLTAREN) 1 % GEL Apply 1 application topically 4 (four) times daily as needed (pain).   empagliflozin (JARDIANCE) 10 MG TABS tablet Take 1 tablet (10 mg total) by mouth daily before breakfast.   EPINEPHrine 0.3 mg/0.3 mL IJ SOAJ injection USE AS DIRECTED BY YOUR PHYSICIAN INTRAMUSCULARLY AS NEEDED FOR ANAPHYLAXIS   famotidine (PEPCID) 20 MG tablet  Take 1 tablet (20 mg total) by mouth daily. (Patient taking differently: Take 20 mg by mouth at bedtime.)   fluticasone (  FLONASE) 50 MCG/ACT nasal spray USE 2 SPRAYS IN EACH NOSTRIL DAILY (Patient taking differently: 2 sprays daily as needed for allergies.)   folic acid (FOLVITE) 1 MG tablet Take 1 tablet (1 mg total) by mouth daily.   furosemide (LASIX) 40 MG tablet Take 1 tablet (40 mg total) by mouth daily. Take 1 table twice a day for 3 days and then back to once daily. (Patient taking differently: Take 20 mg by mouth daily.)   Glucose Blood (BLOOD GLUCOSE TEST STRIPS) STRP Please dispense per patient and insurance preference. Use as directed to monitor FSBS 4x . Dx: E11.65   guaiFENesin (MUCINEX) 600 MG 12 hr tablet Take 1 tablet (600 mg total) by mouth 2 (two) times daily as needed for cough or to loosen phlegm. (Patient taking differently: Take 600 mg by mouth 2 (two) times daily.)   HIZENTRA 2 GM/10ML SOLN Inject 1 Dose into the skin once a week.   Lactase (LACTOSE FAST ACTING RELIEF PO) Take 3 tablets by mouth in the morning, at noon, and at bedtime.   magnesium oxide (MAG-OX) 400 MG tablet Take 400 mg by mouth every evening.   montelukast (SINGULAIR) 10 MG tablet Take 1 tablet (10 mg total) by mouth daily. (Patient taking differently: Take 10 mg by mouth every evening.)   Nystatin (GERHARDT'S BUTT CREAM) CREA Apply topically 3 times a day as needed for itching or irritation   nystatin (MYCOSTATIN) 100000 UNIT/ML suspension Use as directed 15 mLs in the mouth or throat at bedtime as needed (mouth sores).   nystatin ointment (MYCOSTATIN) Apply 1 application topically 2 (two) times daily as needed (yeast).   pantoprazole (PROTONIX) 40 MG tablet Take 1 tablet (40 mg total) by mouth daily.   predniSONE (DELTASONE) 1 MG tablet Take 3 mg by mouth as directed. Take along with 5 mg tablet=8 mg   Prenatal Vit-Fe Fumarate-FA (PRENATAL VITAMINS PO) Take 1 tablet by mouth daily.   PRESCRIPTION  MEDICATION Place 4 drops into both ears See admin instructions. Chloramp- sulfu - ampho d-h droc 100-100-5 hydrocortisone, otic ear drops. Instill 4 drops in to each ear daily for the first 5 days of each month   Probiotic Product (ALIGN PO) Take 1 capsule by mouth daily.   Propylene Glycol (SYSTANE COMPLETE) 0.6 % SOLN Place 1 drop into both eyes 2 (two) times daily.   Tiotropium Bromide-Olodaterol (STIOLTO RESPIMAT) 2.5-2.5 MCG/ACT AERS Inhale 2 puffs into the lungs daily.   traMADol (ULTRAM) 50 MG tablet Take 1 tablet (50 mg total) by mouth at bedtime as needed.   XARELTO 20 MG TABS tablet TAKE 1 TABLET BY MOUTH DAILY WITH SUPPER   [DISCONTINUED] potassium chloride SA (KLOR-CON) 20 MEQ tablet Take 1 tablet (20 mEq total) by mouth daily.   Facility-Administered Encounter Medications as of 02/24/2021  Medication   denosumab (PROLIA) injection 60 mg   iohexol (OMNIPAQUE) 350 MG/ML injection     REVIEW OF SYSTEMS  : All other systems reviewed and negative except where noted in the History of Present Illness.   PHYSICAL EXAM: BP (!) 134/50 (BP Location: Right Arm, Patient Position: Sitting, Cuff Size: Normal)    Pulse 80    Ht 5' (1.524 m) Comment: height measured without shoes   Wt 128 lb (58.1 kg)    BMI 25.00 kg/m  General: Chronically ill-appearing white female in no acute distress Head: Normocephalic and atraumatic Eyes:  Sclerae anicteric, conjunctiva pink. Ears: Normal auditory acuity Lungs: Clear throughout to auscultation; no W/R/R. Heart:  Regular rate and rhythm; no M/R/G. Abdomen: Soft, non-distended.  BS present.  Mild diffuse TTP. Musculoskeletal: Symmetrical with no gross deformities  Skin: No lesions on visible extremities Extremities: No edema  Neurological: Alert oriented x 4, grossly non-focal Psychological:  Alert and cooperative. Normal mood and affect  ASSESSMENT AND PLAN: *Anemia progressive over 4 months after addition of plavix to chronic xarelto after cardiac  stenting in November. S/p 1 PRBC on 02/04/2021 Status post EGD 02/05/2021 which showed GAVE s/p APC Hemoglobin 7.3-->7.8-->8.5--> 8.4 grams Iron low so received a dose of IV iron as inpatient.   *CAD s/p stent to LAD on 01/11/2021.   Low-dose aspirin and Plavix added to chronic Xarelto chronically in use for history DVT.   *Seronegative rheumatoid arthritis.   Failed to respond to or lost response to multiple DMARDs.   Now on Prednisone 10 mg daily.    **She just had repeat CBC and CMP drawn by her PCP this morning.  I am going to add iron studies to that as well.  If her hemoglobin remaind stable then I would say we hold off with her colonoscopy and any other repeat EGD for at least 6 months post her stent placement in order to safely take her off her blood thinners/antiplatelet agents at that time.  If her iron studies are still low we could set her up for further IV iron infusions as an outpatient as she does not tolerate p.o. iron well.  Recommend close monitoring of her blood counts by her PCP otherwise and we will plan to have her seen back here in about 3 months.  Continue Dexilant 60 mg daily.    CC:  [name], MD

## 2021-02-24 NOTE — Patient Instructions (Signed)
Medication Instructions:  Continue your current medications.    Continue potassium for now - we will consider adjusting the medications based on your potassium level.   *If you need a refill on your cardiac medications before your next appointment, please call your pharmacy*   Lab Work: None ordered today.   Testing/Procedures: Your physician has requested that you have an echocardiogram in 3 months. Echocardiography is a painless test that uses sound waves to create images of your heart. It provides your doctor with information about the size and shape of your heart and how well your hearts chambers and valves are working. This procedure takes approximately one hour. There are no restrictions for this procedure.   Follow-Up: At [facility], you and your health needs are our priority.  As part of our continuing mission to provide you with exceptional heart care, we have created designated Provider Care Teams.  These Care Teams include your primary Cardiologist (physician) and Advanced Practice Providers (APPs -  Physician Assistants and Nurse Practitioners) who all work together to provide you with the care you need, when you need it.  We recommend signing up for the patient portal called "MyChart".  Sign up information is provided on this After Visit Summary.  MyChart is used to connect with patients for Virtual Visits (Telemedicine).  Patients are able to view lab/test results, encounter notes, upcoming appointments, etc.  Non-urgent messages can be sent to your provider as well.   To learn more about what you can do with MyChart, go to [email or web address].    Your next appointment:   3 month(s)  The format for your next appointment:   In Person  Provider:   Buford Dresser, MD    Other Instructions  If your blood pressure is consistently more than 130/80 please call and let us know.    Recommend weighing daily and keeping a log. Please call our office if you have  weight gain of 2 pounds overnight or 5 pounds in 1 week.   Date  Time Weight

## 2021-02-24 NOTE — Progress Notes (Signed)
Office Visit    Patient Name: Ebony Scott Date of Encounter: 02/24/2021  PCP:  [name], MD   [place]  Cardiologist:  Buford Dresser, MD  Advanced Practice Provider:  No care team member to display Electrophysiologist:  None      Chief Complaint    Ebony Scott is a 71 y.o. 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   Past Medical History    Past Medical History:  Diagnosis Date   Abscess of dorsum of right hand 09/22/2020   Acute deep vein thrombosis (DVT) of right lower extremity ([place]) 12/13/2017   Allergic rhinitis    Anemia    Angio-edema    Anxiety    pt denies   Arthritis    Phreesia 10/21/2019   Asthma    Phreesia 10/21/2019   Breast cancer ([place]) 1998   in remission   Cataract    REMOVED   Clostridium difficile colitis 01/14/2018   Clostridium difficile diarrhea 86/38/1771   Complication of anesthesia    "had hard time waking up from it several times" (02/20/2012)   Depression    "some; don't take anything for it" (02/20/2012)   Diverticulosis    DVT (deep venous thrombosis) ([place])    Fibromyalgia 11/2011   GERD (gastroesophageal reflux disease)    Graves disease    Headache(784.0)    "related to allergies; more at different times during the year" (02/20/2012)   Hemorrhoids    Hiatal hernia    back and neck   Hx of adenomatous colonic polyps 04/12/2016   Hypercholesteremia    good cholesterol is high   Hypothyroidism    IBS (irritable bowel syndrome)    Moderate persistent asthma    -FeV1 72% 2011, -IgE 102 2011, CT sinus Neg 2011   Osteoporosis    on reclast yearly   Peripheral vascular disease ([place]) 2019   DVTs   Pneumonia 04/2011; ~  11/2011   "double; single" (02/20/2012)   Septic olecranon bursitis of right elbow 09/22/2020   Seronegative rheumatoid arthritis ([place])    Dr. Lahoma Rocker   SIRS (systemic inflammatory response syndrome) ([place]) 02/10/2018   Tracheobronchomalacia    Past Surgical History:  Procedure Laterality Date   ABDOMINAL HYSTERECTOMY N/A    Phreesia 10/21/2019   ANTERIOR AND POSTERIOR REPAIR  1990's   APPENDECTOMY     ARGON LASER APPLICATION  16/57/9038   Procedure: ARGON LASER APPLICATION;  Surgeon: Yetta Flock, MD;  Location: [facility];  Service: Gastroenterology;;   BREAST LUMPECTOMY  1998   left   BREAST SURGERY N/A    Phreesia 10/21/2019   BRONCHIAL WASHINGS  04/05/2020   Procedure: BRONCHIAL WASHINGS;  Surgeon: Freddi Starr, MD;  Location: [facility];  Service: Pulmonary;;   CARPOMETACARPEL ([place]) FUSION OF THUMB WITH AUTOGRAFT FROM RADIUS  ~ 2009   "both thumbs" (02/20/2012)   CATARACT EXTRACTION W/ INTRAOCULAR LENS  IMPLANT, BILATERAL  2012   CERVICAL DISCECTOMY  10/2001   C5-C6   CERVICAL FUSION  2003   C3-C4   CHOLECYSTECTOMY     COLONOSCOPY     CORONARY STENT INTERVENTION N/A 01/11/2021   Procedure: CORONARY STENT INTERVENTION;  Surgeon: Leonie Man, MD;  Location: [facility];  Service: Cardiovascular;  Laterality: N/A;   DEBRIDEMENT TENNIS ELBOW  ?1970's   right   ESOPHAGOGASTRODUODENOSCOPY     ESOPHAGOGASTRODUODENOSCOPY (EGD) WITH PROPOFOL N/A 02/05/2021   Procedure: ESOPHAGOGASTRODUODENOSCOPY (EGD) WITH PROPOFOL;  Surgeon: Yetta Flock, MD;  [facility]: [facility];  Service: Gastroenterology;  Laterality: N/A;   EYE SURGERY N/A    Phreesia 10/21/2019   HYSTERECTOMY     I & D EXTREMITY Right 09/22/2020   Procedure: IRRIGATION AND DEBRIDEMENT RIGHT HAND AND ELBOW;  Surgeon: Marchia Bond, MD;  Location: [facility];  Service: Orthopedics;  Laterality: Right;   KNEE ARTHROPLASTY  ?1990's   "?right; w/cartilage repair" (02/20/2012)   NASAL SEPTUM  SURGERY  1980's   POSTERIOR CERVICAL FUSION/FORAMINOTOMY  2004   "failed initial fusion; rewired  anterior neck" (02/20/2012)   REVERSE SHOULDER ARTHROPLASTY Right 02/16/2020   Procedure: REVERSE SHOULDER ARTHROPLASTY;  Surgeon: Marchia Bond, MD;  [facility]: [facility];  Service: Orthopedics;  Laterality: Right;   RIGHT/LEFT HEART CATH AND CORONARY ANGIOGRAPHY N/A 01/11/2021   Procedure: RIGHT/LEFT HEART CATH AND CORONARY ANGIOGRAPHY;  Surgeon: Leonie Man, MD;  Location: [facility];  Service: Cardiovascular;  Laterality: N/A;   SPINE SURGERY N/A    Phreesia 10/21/2019   TONSILLECTOMY  ~ 1953   VESICOVAGINAL FISTULA CLOSURE W/ TAH  1988   VIDEO BRONCHOSCOPY Bilateral 08/23/2016   Procedure: VIDEO BRONCHOSCOPY WITH FLUORO;  Surgeon: Javier Glazier, MD;  [facility];  Service: Cardiopulmonary;  Laterality: Bilateral;   VIDEO BRONCHOSCOPY N/A 04/05/2020   Procedure: VIDEO BRONCHOSCOPY WITHOUT FLUORO;  Surgeon: Freddi Starr, MD;  [facility];  Service: Pulmonary;  Laterality: N/A;    Allergies  Allergies  Allergen Reactions   Baclofen Other (See Comments)    Altered mental status requiring a 3-day hospital stay- patient became unresponsive   Dust Mite Extract Shortness Of Breath and Other (See Comments)    "sneezing" (02/20/2012)   Molds & Smuts Shortness Of Breath   Morphine And Related Hives and Itching   Other Shortness Of Breath and Other (See Comments)    Grass and weeds "sneezing; filled sinuses" (02/20/2012)   Penicillins Rash and Other (See Comments)    "welts" (02/20/2012) Tolerated Ancef 02/16/20.    Rofecoxib Swelling and Other (See Comments)    Vioxx- feet swelling    Shellfish Allergy Anaphylaxis, Shortness Of Breath, Itching and Rash   Tetracycline Hcl Nausea And Vomiting   Xolair [Omalizumab] Other (See Comments)    Caused Blood clot   Zoledronic Acid Other (See Comments)    Fever, Put in hospital, dr said it was a reaction from a  reaction  Other reaction(s): Unknown   Dilaudid [Hydromorphone Hcl] Itching   Levofloxacin Other (See Comments)    GI upset   Oxycodone Hcl Nausea And Vomiting   Paroxetine Nausea And Vomiting and Other (See Comments)    Paxil    Celecoxib Swelling and Other (See Comments)    Feet swelling   Diltiazem Hcl Swelling   Hydrocodone-Acetaminophen Other (See Comments)    Unknown reaction   Lactose Intolerance (Gi) Other (See Comments)    Bloating and gas   Morphine Sulfate Other (See Comments)    "Gets loopy"   Oxycodone-Acetaminophen Other (See Comments)    Unknown reaction   Rituximab     Anemia    Tree Extract Other (See Comments)    "tested and told I was allergic to it; never experienced a reaction to it" (02/20/2012)   Penicillin G Procaine Rash and  Other (See Comments)    Welts, also    History of Present Illness    CRISTI GWYNN is a 71 y.o. 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   CT chest June 2019 with diffuse calcification including aorta, aortic branch vessels, coronary arteries.  Normal Lexiscan in the past.   She was hospitalized July 2022 with acute renal failure, acute systolic and diastolic heart failure.  Echocardiogram showing new onset heart failure with LVEF 35-40%, wall motion abnormalities, LV moderately dilated, grade 2 diastolic dysfunction, elevated LVEDP, mild MR, moderate calcification of aortic valve without stenosis.   She was seen in follow-up 10/26/20.  Wilder Glade, ACE/ARB/Arni/MRA were on hold due to recent kidney injury.  Cardiac catheterization was held due to kidney dysfunction. She was started on half tablet of Bidil 20-27.5mg  BID. She was encouraged to take Lasix 40mg  QD as  prescribed.     She was started on weekly injections of IVIG by Dr. Neldon Mc for immunodeficiency. PCP reduced Lasix to 20mg  QD with 11/28/20 labs show K 4.2, creatinine 1.19, GFR 49, AST 48, ALT 48. Kidney function back to previous baseline.  '  She was seen in follow up 11/30/20 noting increased fatigue, stable dyspnea on exertion. Jardiance 10mg  QD was started for optimization of HF therapy. Cardiac catheterization was scheduled due to reduced LVEF.   She underwent LHC 01/11/21 with DES to LAD. Discharged on Aspirin 81mg  QD x 1 month, Plavix 75mg  QD x 6 mos, and indefinite Xarelto due to hx of PE and DVT. She had moderate pulmonary hypertension and moderate to severely reduced cardiac function . She was recommended for Lasix 40mg  BID x 3 days then 40mg  QD.   She was seen 01/19/21 and Lasix increased to 40mg  QD as weight up 4 pounds. She noted significant bruising after cath but this was improving.  She was not taking her Isosorbide dinitrate-Hydralazine with plan to monitor BP at home and report in 2 weeks. Labs showed Hb 8.6, stable renal function, BNP 621.2. Repeat CBC in 2 weeks for recommended. 02/04/21 Hb 6.4 and she was recommended to present to the ED for evaluation.   She presents today for follow up after contacting office noting increased bruising at right groin venous access site. Reports no shortness of breath and stable dyspnea on exertion. Reports no chest pain, pressure, or tightness. No  orthopnea, PND. Reports no palpitations.  She completed Lasix 40mg  BID and has since been taking 20mg  QD. Weighing sporadically at home. Weight today up 4 lbs compared to previous. Tells me her bruising is improved. She removed her bandage a few days ago which had dry blood but no new bleeding. Soreness has improved and she is moving more. Tells me she has a "sore" in her nose which she is treating with neosporin and concerned about nasal congestion with possible sinus infection, no reported fever,  encouraged to discuss with primary care.   Admitted 02/04/21-02/07/21 and required blood transfusion. Aspirin/Plavix/Xarelto held initially and Gi consulted. 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 Plavix resumed. Xarelto was also able to be resumed.   She presents today for follow up. Notes persistent fatigue with minimal activity since discharge. She is working with [facility] PT and hoping to establish with [facility]. Reports no shortness of  breath at rest though notes dyspnea on exertion. Reports no chest pain, pressure, or tightness. No edema, orthopnea, PND. Reports no palpitations.  Weights at home have been stable. BP consistently <130/80 at home. Reports no melena, hematuria. We discussed that given her recent admission with anemia would expect her energy level to gradually increase.  EKGs/Labs/Other Studies Reviewed:   The following studies were reviewed today:  [facility] 01/11/21  Prox LAD lesion is 95% stenosed proximal to 1st Diag &  Mid LAD lesion is 90% stenosed following   Mid LAD lesion is 90% stenosed.   A drug-eluting stent was successfully placed covering both lesions using a STENT ONYX FRONTIER 2.25X34.   Post intervention, there is a 0% residual stenosis.   --------------------------------------------------------------   Hemodynamic findings consistent with moderate pulmonary hypertension.   There is no aortic valve stenosis.   SUMMARY Severe single-vessel CAD with 95% and 90% stenoses of the proximal and mid LAD at major 1st Diag branch.  Likely be culprit for reduced EF and anterior hypokinesis on echo. Successful DES PCI of the LAD using an Onyx Frontier DES 2.25 mm x 34 mm postdilated in tapered fashion from 2.6 to 2.52mm.   TIMI III flow pre and post, reduced to 0% Otherwise minimal CAD in the Right dominant system. Moderate mostly Secondary Pulmonary Hypertension/Pulmonary Venous Hypertension: PAP-mean 57/19 mmHg - 39 mmHg with PCWP 33 million mercury  (very large V wave), and LVEDP of 25 mmHg. Moderate to severely reduced cardiac function with Cardiac Output-Index of 4.05-2.59 by Fick and 3.75-2.40 by Thermal Dilution.   RECOMMENDATIONS Continue to titrate medical management of chronic Systolic and diastolic heart failure.  She was given 40 mg IV Lasix on the Cath Lab table.  Recommend that she takes 40 mg twice daily oral Lasix for 3 days postprocedure.   She will need close follow-up with either primary cardiologist or APP to initiate titration of medications. Clinically stable for d/c today   Echo 09/16/20 1. Septal apical and anterior wall hypokinesis EF decreased compard to TTE done 07/10/19 . Left ventricular ejection fraction, by estimation, is 35 to 40%. The left ventricle has moderately decreased function. The left ventricle demonstrates regional wall motion abnormalities (see scoring diagram/findings for description). The left ventricular internal cavity size was moderately dilated. Left ventricular diastolic parameters are consistent with Grade II diastolic dysfunction (pseudonormalization). Elevated left ventricular end-diastolic pressure.   2. Right ventricular systolic function is normal. The right ventricular size is normal.   3. Left atrial size was mildly dilated.   4. The mitral valve is abnormal. Mild mitral valve regurgitation. No evidence of mitral stenosis.   5. The aortic valve is tricuspid. There is moderate calcification of the aortic valve. Aortic valve regurgitation is not visualized. Mild to moderate aortic valve sclerosis/calcification is present, without any evidence of aortic stenosis.   6. The inferior vena cava is normal in size with greater than 50% respiratory variability, suggesting right atrial pressure of 3 mmHg.   FINDINGS   Left Ventricle: Septal apical and anterior wall hypokinesis EF decreased compard to TTE done 07/10/19.   Echo 07/10/19 1. Hyperdynamic LV function; grade 1 diastolic dysfunction;  moderate LVH;  trace AI.   2. Left ventricular ejection fraction, by estimation, is >75%. The left  ventricle has hyperdynamic function. The left ventricle has no regional  wall motion abnormalities. There is moderate left ventricular hypertrophy.  Left ventricular diastolic  parameters are consistent with Grade I diastolic dysfunction (impaired  relaxation).  3. Right ventricular systolic function is normal. The right ventricular  size is normal.   4. The mitral valve is normal in structure. No evidence of mitral valve  regurgitation. No evidence of mitral stenosis.   5. The aortic valve has an indeterminant number of cusps. Aortic valve  regurgitation is trivial. No aortic stenosis is present.   Echo 08/05/17 Study Conclusions   - Left ventricle: The cavity size was normal. There was mild focal   basal hypertrophy of the septum. Systolic function was vigorous.   The estimated ejection fraction was in the range of 65% to 70%.   Wall motion was normal; there were no regional wall motion   abnormalities. Doppler parameters are consistent with abnormal   left ventricular relaxation (grade 1 diastolic dysfunction).   Impressions:   - Definity used; vigorous LV systolic function; mild diastolic   dysfunction.   CT chest high resolution 6.17.19 FINDINGS: Cardiovascular: Heart size is normal. There is no significant pericardial fluid, thickening or pericardial calcification. There is aortic atherosclerosis, as well as atherosclerosis of the great vessels of the mediastinum and the coronary arteries, including calcified atherosclerotic plaque in the left main, left anterior descending left circumflex coronary arteries.   IMPRESSION: 1. No findings to suggest interstitial lung disease. 2. Mild air trapping indicative of mild small airways disease. 3. Aortic atherosclerosis, in addition to left main and 2 vessel coronary artery disease. Please note that although the presence  of coronary artery calcium documents the presence of coronary artery disease, the severity of this disease and any potential stenosis cannot be assessed on this non-gated CT examination. Assessment for potential risk factor modification, dietary therapy or pharmacologic therapy may be warranted, if clinically indicated. 4. Additional incidental findings, as above.   Stress 2011:  Stress echo 11/2009 Study Conclusions - Stress ECG conclusions: There were no stress arrhythmias or conduction abnormalities. The stress ECG was normal. - Staged echo: There was no echocardiographic evidence for stress-induced ischemia. Impressions: - Nondiagnostic stress echocardiogram with nochest pain, no ST changes; patient did not achieve target heart rate; no   stress-induced wall motion abnormalities.   Had [place] 12/2009 due to nondiagnostic stress echo: Stress Procedure  The patient received IV Lexiscan 0.4 mg over 15-seconds with concurrent low level exercise and then Technetium 61m Tetrofosmin was injected at 30-seconds while the patient continued walking one more minute.  There were no significant changes with Lexiscan.  Quantitative spect images were obtained after a 45 minute delay.   QPS Raw Data Images:  Normal; no motion artifact; normal heart/lung ratio. Stress Images:  Normal homogeneous uptake in all areas of the myocardium. Rest Images:  Normal homogeneous uptake in all areas of the myocardium. Subtraction (SDS):  No evidence of ischemia. Transient Ischemic Dilatation:  1.15  (Normal <1.22)  Lung/Heart Ratio:  .27  (Normal <0.45)   Quantitative Gated Spect Images QGS EDV:  52 ml QGS ESV:  13 ml QGS EF:  75 % QGS cine images:  Normal motion   Findings Normal nuclear study  EKG:  No EKG today.   Recent Labs: 09/23/2020: TSH 0.983 10/04/2020: Magnesium 3.1 01/19/2021: BNP 621.2 02/04/2021: ALT 33 02/07/2021: BUN 27; Creatinine, Ser 1.30; Potassium 3.6; Sodium 139 02/14/2021:  Hemoglobin 9.1; Platelets 365.0  Recent Lipid Panel    Component Value Date/Time   CHOL 162 12/21/2020 0826   TRIG 210 (H) 12/21/2020 0826   HDL 64 12/21/2020 0826   CHOLHDL 2.5 12/21/2020 0826   CHOLHDL 8.4 09/23/2020 1253  VLDL UNABLE TO CALCULATE IF TRIGLYCERIDE OVER 400 mg/dL 09/23/2020 1253   LDLCALC 64 12/21/2020 0826   LDLCALC 77 07/04/2018 1017   LDLDIRECT 141.2 (H) 09/23/2020 1253    Home Medications   Current Meds  Medication Sig   acetaminophen (TYLENOL) 650 MG CR tablet Take 1,300 mg by mouth every 8 (eight) hours as needed for pain.   albuterol (PROVENTIL) (2.5 MG/3ML) 0.083% nebulizer solution Take 3 mLs (2.5 mg total) by nebulization every 4 (four) hours as needed for wheezing or shortness of breath.   albuterol (VENTOLIN HFA) 108 (90 Base) MCG/ACT inhaler Inhale 2 puffs into the lungs every 6 (six) hours as needed for wheezing or shortness of breath.   allopurinol (ZYLOPRIM) 100 MG tablet Take 1 tablet (100 mg total) by mouth daily.   Alpha-D-Galactosidase (BEANO PO) Take 2 tablets by mouth 3 (three) times daily with meals.   Alpha-Lipoic Acid 600 MG TABS Take 600 mg by mouth daily.   atorvastatin (LIPITOR) 80 MG tablet Take 1 tablet (80 mg total) by mouth daily.   azelastine (ASTELIN) 0.1 % nasal spray Use in each nostril as directed (Patient taking differently: 2 sprays daily as needed for allergies.)   benzonatate (TESSALON) 200 MG capsule Take 1 capsule (200 mg total) by mouth 3 (three) times daily as needed for cough.   budesonide (PULMICORT) 0.5 MG/2ML nebulizer solution Take 2 mLs (0.5 mg total) by nebulization 2 (two) times daily as needed.   CALCIUM-MAGNESIUM PO Take 1 tablet by mouth every evening.   cetirizine (ZYRTEC) 10 MG tablet Take 1 tablet (10 mg total) by mouth daily. (Patient taking differently: Take 10 mg by mouth at bedtime.)   cholecalciferol (VITAMIN D) 25 MCG (1000 UNIT) tablet 1,000 Units every evening.   clopidogrel (PLAVIX) 75 MG tablet Take  1 tablet (75 mg total) by mouth daily.   dexlansoprazole (DEXILANT) 60 MG capsule Take 1 capsule (60 mg total) by mouth daily.   diclofenac Sodium (VOLTAREN) 1 % GEL Apply 1 application topically 4 (four) times daily as needed (pain).   empagliflozin (JARDIANCE) 10 MG TABS tablet Take 1 tablet (10 mg total) by mouth daily before breakfast.   EPINEPHrine 0.3 mg/0.3 mL IJ SOAJ injection USE AS DIRECTED BY YOUR PHYSICIAN INTRAMUSCULARLY AS NEEDED FOR ANAPHYLAXIS   famotidine (PEPCID) 20 MG tablet Take 1 tablet (20 mg total) by mouth daily. (Patient taking differently: Take 20 mg by mouth at bedtime.)   fluticasone (FLONASE) 50 MCG/ACT nasal spray USE 2 SPRAYS IN EACH NOSTRIL DAILY (Patient taking differently: 2 sprays daily as needed for allergies.)   folic acid (FOLVITE) 1 MG tablet Take 1 tablet (1 mg total) by mouth daily.   furosemide (LASIX) 40 MG tablet Take 1 tablet (40 mg total) by mouth daily. Take 1 table twice a day for 3 days and then back to once daily. (Patient taking differently: Take 20 mg by mouth daily.)   Glucose Blood (BLOOD GLUCOSE TEST STRIPS) STRP Please dispense per patient and insurance preference. Use as directed to monitor FSBS 4x . Dx: E11.65   guaiFENesin (MUCINEX) 600 MG 12 hr tablet Take 1 tablet (600 mg total) by mouth 2 (two) times daily as needed for cough or to loosen phlegm. (Patient taking differently: Take 600 mg by mouth 2 (two) times daily.)   HIZENTRA 2 GM/10ML SOLN Inject 1 Dose into the skin once a week.   Lactase (LACTOSE FAST ACTING RELIEF PO) Take 3 tablets by mouth in the morning, at noon,  and at bedtime.   magnesium oxide (MAG-OX) 400 MG tablet Take 400 mg by mouth every evening.   montelukast (SINGULAIR) 10 MG tablet Take 1 tablet (10 mg total) by mouth daily. (Patient taking differently: Take 10 mg by mouth every evening.)   Nystatin (GERHARDT'S BUTT CREAM) CREA Apply topically 3 times a day as needed for itching or irritation   nystatin (MYCOSTATIN)  100000 UNIT/ML suspension Use as directed 15 mLs in the mouth or throat at bedtime as needed (mouth sores).   nystatin ointment (MYCOSTATIN) Apply 1 application topically 2 (two) times daily as needed (yeast).   pantoprazole (PROTONIX) 40 MG tablet Take 1 tablet (40 mg total) by mouth daily.   predniSONE (DELTASONE) 1 MG tablet Take 3 mg by mouth as directed. Take along with 5 mg tablet=8 mg   Prenatal Vit-Fe Fumarate-FA (PRENATAL VITAMINS PO) Take 1 tablet by mouth daily.   Probiotic Product (ALIGN PO) Take 1 capsule by mouth daily.   Propylene Glycol (SYSTANE COMPLETE) 0.6 % SOLN Place 1 drop into both eyes 2 (two) times daily.   Tiotropium Bromide-Olodaterol (STIOLTO RESPIMAT) 2.5-2.5 MCG/ACT AERS Inhale 2 puffs into the lungs daily.   traMADol (ULTRAM) 50 MG tablet Take 1 tablet (50 mg total) by mouth at bedtime as needed.   XARELTO 20 MG TABS tablet TAKE 1 TABLET BY MOUTH DAILY WITH SUPPER   [DISCONTINUED] potassium chloride SA (KLOR-CON) 20 MEQ tablet Take 1 tablet (20 mEq total) by mouth daily.   Current Facility-Administered Medications for the 02/24/21 encounter (Office Visit) with Loel Dubonnet, NP  Medication   denosumab (PROLIA) injection 60 mg     Review of Systems      All other systems reviewed and are otherwise negative except as noted above.  Physical Exam    VS:  BP (!) 110/58    Pulse 81    Ht 5\' 1"  (1.549 m)    Wt 128 lb 6.4 oz (58.2 kg)    SpO2 95%    BMI 24.26 kg/m  , BMI Body mass index is 24.26 kg/m.  Wt Readings from Last 3 Encounters:  02/24/21 128 lb (58.1 kg)  02/24/21 128 lb 6.4 oz (58.2 kg)  02/24/21 128 lb (58.1 kg)   GEN: Well nourished, well developed, in no acute distress. HEENT: normal. Neck: Supple, no JVD, carotid bruits, or masses. Cardiac: RRR, no murmurs, rubs, or gallops. No clubbing, cyanosis, edema.  Radials/PT 2+ and equal bilaterally.  Respiratory:  Respirations regular and unlabored, clear to auscultation bilaterally. GI: Soft,  nontender, nondistended. MS: No deformity or atrophy. Skin: Warm and dry, no rash.  Neuro:  Strength and sensation are intact. Psych: Normal affect.  Assessment & Plan    CAD - 01/11/21 s/p DES PCI of LAD. Recommended for Xarelto due to hx of DVT/PE, Plavix 75mg .  GDMT includes plavix, atorvastatin. Heart healthy diet and regular cardiovascular exercise encouraged.    Symptomatic anemia - Recent admission with blood transfusion. 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 CBC performed earlier today by PCP. No recurrent melena nor hematuria.   HFrEF/ICM - [facility] 01/11/21 with DES to RCA, moderate secondary pulmonary hypertension, moderate to severely reduced cardiac function. Echo 08/2020 reduced LVEF 35-40%.  Escalation of GDMT previously limited by relative hypotension as well as renal function. Daily weight, low salt diet, fluid restriction <2 L encouraged. GDMT includes Lasix. Unable to escalate therapy in setting of relative hypotension with anemia. Will update echocardiogram prior to her appointment in 3 months to  reassess LVEF.   DM2 - Continue to follow with PCP. Recent A1c 5.5.   INE0H - Careful titration of diuretic and antihypertensive.  Labs performed today by PCP including CMET, CBC.  LDL goal <70 - 12/21/20 total cholesterol 162, triglycerides 210, HDL 64, LDL 64. Continue Atorvastatin 80mg  QD. Denies myalgias. Triglycerides not at goal. Recommend reducing intake of carbohydrates, sweets, sugars.  Hypokalemia - Taking potassium 5mEq daily. CMET today by PCP. Will assess potassium and determine whether any changes to therapy plan is needed.   Disposition: Follow up in 3 months with Buford Dresser, MD or APP.  Signed, Loel Dubonnet, NP 02/24/2021, 3:03 PM [place]

## 2021-02-24 NOTE — Patient Instructions (Signed)
Your provider has requested that you go to the basement level for lab work before leaving today. Press "B" on the elevator. The lab is located at the first door on the left as you exit the elevator.  If you are age 74 or older, your body mass index should be between 23-30. Your Body mass index is 25 kg/m. If this is out of the aforementioned range listed, please consider follow up with your Primary Care Provider.  If you are age 48 or younger, your body mass index should be between 19-25. Your Body mass index is 25 kg/m. If this is out of the aformentioned range listed, please consider follow up with your Primary Care Provider.   ________________________________________________________  The [facility] providers would like to encourage you to use [facility] to communicate with providers for non-urgent requests or questions.  Due to long hold times on the telephone, sending your provider a message by [facility] may be a faster and more efficient way to get a response.  Please allow 48 business hours for a response.  Please remember that this is for non-urgent requests.  _______________________________________________________

## 2021-02-24 NOTE — Progress Notes (Signed)
Subjective:    Patient ID: Ebony Scott, female    DOB: 1950/08/26, 71 y.o.   MRN: [id number]  HPI  Admit date: 02/04/2021 Discharge date: 02/07/2021   Admitted From: Home Disposition:  Home   Recommendations for Outpatient Follow-up:  Follow up with PCP in 1 week Follow up with GI Follow up with Cardiology Repeat CBC to check hemoglobin in 1 week    Discharge Condition: Stable CODE STATUS: Full  Diet recommendation: Heart healthy diet    Brief/Interim Summary: Ebony Scott is a 71 y.o. female with medical history significant of right hand abscess, history of DVT and PE on Xarelto, allergic rhinitis, chronic blood loss anemia, angioedema, anxiety/depression, osteoarthritis, mild intermittent asthma, cataracts, C. difficile colitis, diverticulosis, GERD, hiatal hernia, hemorrhoids, IBS, colon polyps, fibromyalgia, headaches, hyperlipidemia, hypothyroid, seronegative rheumatoid arthritis, PVD, CAD with recent drug-eluting stent placement on LAD about 3 weeks ago with addition of aspirin and clopidogrel to her medication regimen who is coming to the emergency department with complaints of progressively worse fatigue and somnolence in the last few days setting of worsening chronic anemia.  She went to see her PCP yesterday who asked the patient to come to the emergency department due to a hemoglobin of 6.5 g/dL. She was given blood transfusion and admitted to the hospital. Aspirin/plavix/xarelto held. GI consulted.  Patient underwent EGD 12/18 which revealed gastric antral vascular ectasia, treated with APC.  She was resumed on Plavix.  Patient remained stable with stable hemoglobin count.  Discussed with cardiology, Dr. Stanford Breed over the phone, who recommended to stop aspirin but patient should continue on Plavix.  Xarelto was also resumed as patient has history of PE, DVT and is currently on lifelong anticoagulation due to recurrent episode of thromboembolism.   Discharge Diagnoses:   Principal Problem:   Symptomatic anemia Active Problems:   Hyperlipidemia   GERD   Chronic combined systolic and diastolic CHF (congestive heart failure) (HCC)   Chronic kidney disease, stage 3b (HCC)   Chronic pain syndrome   History of pulmonary embolus (PE)   DMII (diabetes mellitus, type 2) (HCC)   History of DVT (deep vein thrombosis)   GAVE (gastric antral vascular ectasia)       Symptomatic anemia with acute on chronic blood loss, secondary to GAVE, iron deficiency anemia -GI consulted, status post EGD 12/18, showed GAVE s/p APC  -Hgb stable today  -IV iron today  -Follow-up with GI outpatient   CAD with DES -Spoke on the phone with Dr. Stanford Breed on 12/20.  Okay to stop aspirin.  Continue Plavix   Diabetes mellitus type 2, well controlled  -A1c 5.5  -Resume home jardiance and glipizide   Hyperlipidemia -Continue atorvastatin  Chronic combined systolic and diastolic heart failure -Continue Lasix  CKD stage IIIb -Stable  History of PE, DVT -Resume xarelto    Rheumatoid arthritis -Continue prednisone   02/14/21 Her insurance has refused dexilant.  This is despite the fact the patient was just admitted to the hospital with an upper GI bleed and a hemoglobin of 6.5!  She is not presently on any PPI.  She is taking Xarelto and Plavix.  This is at the recommendation of her cardiologist due to her new drug-eluting stent and also her history of recurrent thromboembolism.  Therefore she is at high risk for recurrent upper GI bleed.  We are working on the prior approval with her insurance company that she is extremely high risk for repeat admission to the hospital.  Therefore  I will place the patient on Protonix immediately and hopefully the delay from her insurance company will not prove dangerous for the patient.  She denies any chest pain however she does report severe fatigue and lethargy.  She had her hemoglobin checked today and it is 9.1.  This is up from 8.7 on the  20th.  Therefore there has been no evidence of any additional drop in her hemoglobin.  She is only taking iron sporadically due to constipation.  In the hospital her hemoglobin A1c was down to 5.5!  She is still taking glipizide as well as Jardiance.  We discussed that today and have decided to discontinue glipizide.  She is also only on 20 mg a day of Lasix.  However today her legs are not swollen.  Her lungs show no evidence of pulmonary edema and she appears euvolemic.  Therefore at the present time 20 mg of Lasix seems sufficient.  She does not demonstrate any anasarca.  She is working with rheumatology and they have weaned her down to 80 mg a day of prednisone for her seronegative rheumatoid arthritis. At that time, my plan was:  Thankfully her hemoglobin remained stable and is up to 9.1 showing no evidence of ongoing bleeding after cautery.  However I want the patient to stay on a PPI indefinitely.  Until her insurance hopefully approves dexilant,I will start her on Protonix 40 mg a day.  This is to avoid any interaction with Plavix and to prevent recurrent GI bleeding.  I would like to recheck her hemoglobin in 2 weeks.  If there is any drop in her hemoglobin I will plan to discontinue Xarelto and keep the patient only on Plavix.  Her blood sugar is outstanding so have asked her to stop glipizide but continue Jardiance due to her reduced ejection fraction heart failure.  Today she shows no evidence of edema so I will also have her reduce her Lasix to 20 mg a day.  02/24/21 Patient was able to have [place] approved by her insurance and she is taking it daily.  She denies any melena or hematochezia or abdominal pain.  She is here today to recheck her hemoglobin to confirm stability no ongoing GI bleed.  Unfortunately she has noticed increasing edema in both of her legs distal to the knees along with increasing shortness of breath.  Today she has +1 pitting edema in the right leg and +2 pitting edema in the  left leg distal to the knee. Past Medical History:  Diagnosis Date   Abscess of dorsum of right hand 09/22/2020   Acute deep vein thrombosis (DVT) of right lower extremity ([place]) 12/13/2017   Allergic rhinitis    Anemia    Angio-edema    Anxiety    pt denies   Arthritis    Phreesia 10/21/2019   Asthma    Phreesia 10/21/2019   Breast cancer ([place]) 1998   in remission   Cataract    REMOVED   Clostridium difficile colitis 01/14/2018   Clostridium difficile diarrhea 08/12/7626   Complication of anesthesia    "had hard time waking up from it several times" (02/20/2012)   Depression    "some; don't take anything for it" (02/20/2012)   Diverticulosis    DVT (deep venous thrombosis) ([place])    Fibromyalgia 11/2011   GERD (gastroesophageal reflux disease)    Graves disease    Headache(784.0)    "related to allergies; more at different times during the year" (02/20/2012)   Hemorrhoids  Hiatal hernia    back and neck   Hx of adenomatous colonic polyps 04/12/2016   Hypercholesteremia    good cholesterol is high   Hypothyroidism    IBS (irritable bowel syndrome)    Moderate persistent asthma    -FeV1 72% 2011, -IgE 102 2011, CT sinus Neg 2011   Osteoporosis    on reclast yearly   Peripheral vascular disease ([place]) 2019   DVTs   Pneumonia 04/2011; ~ 11/2011   "double; single" (02/20/2012)   Septic olecranon bursitis of right elbow 09/22/2020   Seronegative rheumatoid arthritis ([place])    Dr. Lahoma Rocker   SIRS (systemic inflammatory response syndrome) ([place]) 02/10/2018   Tracheobronchomalacia    Past Surgical History:  Procedure Laterality Date   ABDOMINAL HYSTERECTOMY N/A    Phreesia 10/21/2019   ANTERIOR AND POSTERIOR REPAIR  1990's   APPENDECTOMY     ARGON LASER APPLICATION  70/96/2836   Procedure: ARGON LASER APPLICATION;  Surgeon: Yetta Flock, MD;  Location: [facility];  Service: Gastroenterology;;   BREAST LUMPECTOMY  1998   left   BREAST SURGERY N/A    Phreesia  10/21/2019   BRONCHIAL WASHINGS  04/05/2020   Procedure: BRONCHIAL WASHINGS;  Surgeon: Freddi Starr, MD;  Location: [facility];  Service: Pulmonary;;   CARPOMETACARPEL ([place]) FUSION OF THUMB WITH AUTOGRAFT FROM RADIUS  ~ 2009   "both thumbs" (02/20/2012)   CATARACT EXTRACTION W/ INTRAOCULAR LENS  IMPLANT, BILATERAL  2012   CERVICAL DISCECTOMY  10/2001   C5-C6   CERVICAL FUSION  2003   C3-C4   CHOLECYSTECTOMY     COLONOSCOPY     CORONARY STENT INTERVENTION N/A 01/11/2021   Procedure: CORONARY STENT INTERVENTION;  Surgeon: Leonie Man, MD;  Location: [place];  Service: Cardiovascular;  Laterality: N/A;   DEBRIDEMENT TENNIS ELBOW  ?1970's   right   ESOPHAGOGASTRODUODENOSCOPY     ESOPHAGOGASTRODUODENOSCOPY (EGD) WITH PROPOFOL N/A 02/05/2021   Procedure: ESOPHAGOGASTRODUODENOSCOPY (EGD) WITH PROPOFOL;  Surgeon: Yetta Flock, MD;  [facility];  Service: Gastroenterology;  Laterality: N/A;   EYE SURGERY N/A    Phreesia 10/21/2019   HYSTERECTOMY     I & D EXTREMITY Right 09/22/2020   Procedure: IRRIGATION AND DEBRIDEMENT RIGHT HAND AND ELBOW;  Surgeon: Marchia Bond, MD;  Location: [facility];  Service: Orthopedics;  Laterality: Right;   KNEE ARTHROPLASTY  ?1990's   "?right; w/cartilage repair" (02/20/2012)   NASAL SEPTUM SURGERY  1980's   POSTERIOR CERVICAL FUSION/FORAMINOTOMY  2004   "failed initial fusion; rewired  anterior neck" (02/20/2012)   REVERSE SHOULDER ARTHROPLASTY Right 02/16/2020   Procedure: REVERSE SHOULDER ARTHROPLASTY;  Surgeon: Marchia Bond, MD;  [facility];  Service: Orthopedics;  Laterality: Right;   RIGHT/LEFT HEART CATH AND CORONARY ANGIOGRAPHY N/A 01/11/2021   Procedure: RIGHT/LEFT HEART CATH AND CORONARY ANGIOGRAPHY;  Surgeon: Leonie Man, MD;  Location: [place] [facility];  Service: Cardiovascular;  Laterality: N/A;   SPINE SURGERY N/A    Phreesia 10/21/2019   TONSILLECTOMY  ~ 1953   VESICOVAGINAL FISTULA CLOSURE W/ TAH   1988   VIDEO BRONCHOSCOPY Bilateral 08/23/2016   Procedure: VIDEO BRONCHOSCOPY WITH FLUORO;  Surgeon: Javier Glazier, MD;  [facility];  Service: Cardiopulmonary;  Laterality: Bilateral;   VIDEO BRONCHOSCOPY N/A 04/05/2020   Procedure: VIDEO BRONCHOSCOPY WITHOUT FLUORO;  Surgeon: Freddi Starr, MD;  [facility];  Service: Pulmonary;  Laterality: N/A;   Current Outpatient Medications on File Prior to Visit  Medication Sig Dispense Refill   acetaminophen (TYLENOL) 650 MG CR tablet Take 1,300 mg by mouth every 8 (eight) hours as needed for pain.     albuterol (PROVENTIL) (2.5 MG/3ML) 0.083% nebulizer solution Take 3 mLs (2.5 mg total) by nebulization every 4 (four) hours as needed for wheezing or shortness of breath. 75 mL 12   albuterol (VENTOLIN HFA) 108 (90 Base) MCG/ACT inhaler Inhale 2 puffs into the lungs every 6 (six) hours as needed for wheezing or shortness of breath.     allopurinol (ZYLOPRIM) 100 MG tablet Take 1 tablet (100 mg total) by mouth daily. 90 tablet 3   Alpha-D-Galactosidase (BEANO PO) Take 2 tablets by mouth 3 (three) times daily with meals.     Alpha-Lipoic Acid 600 MG TABS Take 600 mg by mouth daily.     atorvastatin (LIPITOR) 80 MG tablet Take 1 tablet (80 mg total) by mouth daily. 90 tablet 3   azelastine (ASTELIN) 0.1 % nasal spray Use in each nostril as directed (Patient taking differently: 2 sprays daily as needed for allergies.) 30 mL 5   benzonatate (TESSALON) 200 MG capsule Take 1 capsule (200 mg total) by mouth 3 (three) times daily as needed for cough. 30 capsule 0   budesonide (PULMICORT) 0.5 MG/2ML nebulizer solution Take 2 mLs (0.5 mg total) by nebulization 2 (two) times daily as needed. 120 mL 12   CALCIUM-MAGNESIUM PO Take 1 tablet by mouth every evening.     cetirizine (ZYRTEC) 10 MG tablet Take 1 tablet (10 mg total) by mouth daily. (Patient taking differently: Take 10 mg by mouth at bedtime.) 30 tablet 5   cholecalciferol  (VITAMIN D) 25 MCG (1000 UNIT) tablet 1,000 Units every evening.     clopidogrel (PLAVIX) 75 MG tablet Take 1 tablet (75 mg total) by mouth daily. 90 tablet 3   dexlansoprazole (DEXILANT) 60 MG capsule Take 1 capsule (60 mg total) by mouth daily. 90 capsule 3   diclofenac Sodium (VOLTAREN) 1 % GEL Apply 1 application topically 4 (four) times daily as needed (pain).     empagliflozin (JARDIANCE) 10 MG TABS tablet Take 1 tablet (10 mg total) by mouth daily before breakfast. 30 tablet 5   EPINEPHrine 0.3 mg/0.3 mL IJ SOAJ injection USE AS DIRECTED BY YOUR PHYSICIAN INTRAMUSCULARLY AS NEEDED FOR ANAPHYLAXIS 2 each 1   famotidine (PEPCID) 20 MG tablet Take 1 tablet (20 mg total) by mouth daily. (Patient taking differently: Take 20 mg by mouth at bedtime.) 30 tablet 5   fluticasone (FLONASE) 50 MCG/ACT nasal spray USE 2 SPRAYS IN EACH NOSTRIL DAILY (Patient taking differently: 2 sprays daily as needed for allergies.) 16 g 2   folic acid (FOLVITE) 1 MG tablet Take 1 tablet (1 mg total) by mouth daily. 90 tablet 3   furosemide (LASIX) 40 MG tablet Take 1 tablet (40 mg total) by mouth daily. Take 1 table twice a day for 3 days and then back to once daily. (Patient taking differently: Take 20 mg by mouth daily.) 30 tablet 2   Glucose Blood (BLOOD GLUCOSE TEST STRIPS) STRP Please dispense per patient and insurance preference. Use as directed to monitor FSBS 4x . Dx: E11.65 150 strip 1   guaiFENesin (MUCINEX) 600 MG 12 hr tablet Take 1 tablet (600 mg total) by mouth 2 (two) times daily as needed for cough or to loosen phlegm. (Patient taking differently: Take 600 mg by mouth 2 (two) times daily.) 60 tablet 1   HIZENTRA 2 GM/10ML SOLN  Inject 1 Dose into the skin once a week.     Lactase (LACTOSE FAST ACTING RELIEF PO) Take 3 tablets by mouth in the morning, at noon, and at bedtime.     magnesium oxide (MAG-OX) 400 MG tablet Take 400 mg by mouth every evening.     montelukast (SINGULAIR) 10 MG tablet Take 1 tablet  (10 mg total) by mouth daily. (Patient taking differently: Take 10 mg by mouth every evening.) 30 tablet 5   Nystatin (GERHARDT'S BUTT CREAM) CREA Apply topically 3 times a day as needed for itching or irritation 4 each 6   nystatin (MYCOSTATIN) 100000 UNIT/ML suspension Use as directed 15 mLs in the mouth or throat at bedtime as needed (mouth sores).     nystatin ointment (MYCOSTATIN) Apply 1 application topically 2 (two) times daily as needed (yeast).     pantoprazole (PROTONIX) 40 MG tablet Take 1 tablet (40 mg total) by mouth daily. 30 tablet 3   predniSONE (DELTASONE) 1 MG tablet Take 3 mg by mouth as directed. Take along with 5 mg tablet=8 mg     Prenatal Vit-Fe Fumarate-FA (PRENATAL VITAMINS PO) Take 1 tablet by mouth daily.     PRESCRIPTION MEDICATION Place 4 drops into both ears See admin instructions. Chloramp- sulfu - ampho d-h droc 100-100-5 hydrocortisone, otic ear drops. Instill 4 drops in to each ear daily for the first 5 days of each month     Probiotic Product (ALIGN PO) Take 1 capsule by mouth daily.     Propylene Glycol (SYSTANE COMPLETE) 0.6 % SOLN Place 1 drop into both eyes 2 (two) times daily.     Tiotropium Bromide-Olodaterol (STIOLTO RESPIMAT) 2.5-2.5 MCG/ACT AERS Inhale 2 puffs into the lungs daily. 4 g 5   traMADol (ULTRAM) 50 MG tablet Take 1 tablet (50 mg total) by mouth at bedtime as needed. 30 tablet 1   XARELTO 20 MG TABS tablet TAKE 1 TABLET BY MOUTH DAILY WITH SUPPER 90 tablet 3   [DISCONTINUED] potassium chloride SA (KLOR-CON) 20 MEQ tablet Take 1 tablet (20 mEq total) by mouth daily. (Patient not taking: No sig reported) 30 tablet 3   Current Facility-Administered Medications on File Prior to Visit  Medication Dose Route Frequency Provider Last Rate Last Admin   denosumab (PROLIA) injection 60 mg  60 mg Subcutaneous Q6 months [name], MD   60 mg at 12/05/20 1201   iohexol (OMNIPAQUE) 350 MG/ML injection    PRN Leonie Man, MD   105 mL at 01/11/21  0955   Allergies  Allergen Reactions   Baclofen Other (See Comments)    Altered mental status requiring a 3-day hospital stay- patient became unresponsive   Dust Mite Extract Shortness Of Breath and Other (See Comments)    "sneezing" (02/20/2012)   Molds & Smuts Shortness Of Breath   Morphine And Related Hives and Itching   Other Shortness Of Breath and Other (See Comments)    Grass and weeds "sneezing; filled sinuses" (02/20/2012)   Penicillins Rash and Other (See Comments)    "welts" (02/20/2012) Tolerated Ancef 02/16/20.    Rofecoxib Swelling and Other (See Comments)    Vioxx- feet swelling    Shellfish Allergy Anaphylaxis, Shortness Of Breath, Itching and Rash   Tetracycline Hcl Nausea And Vomiting   Xolair [Omalizumab] Other (See Comments)    Caused Blood clot   Zoledronic Acid Other (See Comments)    Fever, Put in hospital, dr said it was a reaction from a reaction  Other reaction(s): Unknown   Dilaudid [Hydromorphone Hcl] Itching   Levofloxacin Other (See Comments)    GI upset   Oxycodone Hcl Nausea And Vomiting   Paroxetine Nausea And Vomiting and Other (See Comments)    Paxil    Celecoxib Swelling and Other (See Comments)    Feet swelling   Diltiazem Hcl Swelling   Hydrocodone-Acetaminophen Other (See Comments)    Unknown reaction   Lactose Intolerance (Gi) Other (See Comments)    Bloating and gas   Morphine Sulfate Other (See Comments)    "Gets loopy"   Oxycodone-Acetaminophen Other (See Comments)    Unknown reaction   Rituximab     Anemia    Tree Extract Other (See Comments)    "tested and told I was allergic to it; never experienced a reaction to it" (02/20/2012)   Penicillin G Procaine Rash and Other (See Comments)    Welts, also   Social History   Socioeconomic History   Marital status: Divorced    Spouse name: Not on file   Number of children: 2   Years of education: college   Highest education level: Not on file  Occupational History   Occupation:  Disabled    Comment: Retired Engineer, production: RETIRED  Tobacco Use   Smoking status: Never    Passive exposure: Yes   Smokeless tobacco: Never   Tobacco comments:    Parents  Vaping Use   Vaping Use: Never used  Substance and Sexual Activity   Alcohol use: Not Currently    Alcohol/week: 0.0 standard drinks   Drug use: No   Sexual activity: Never  Other Topics Concern   Not on file  Social History Narrative   Patient lives at home alone. Patient  divorced.    Patient has her BS degree.   Right handed.   Caffeine- sometimes coffee.      [facility]:   Born in [place], [place]. She worked as a Copywriter, advertising. She has no pets currently. She does have indoor plants. Previously had mold in her home that was remediated. Carpet was removed.          Social Determinants of Health   Financial Resource Strain: Low Risk    Difficulty of Paying Living Expenses: Not hard at all  Food Insecurity: No Food Insecurity   Worried About Charity fundraiser in the Last Year: Never true   [place] in the Last Year: Never true  Transportation Needs: No Transportation Needs   Lack of Transportation (Medical): No   Lack of Transportation (Non-Medical): No  Physical Activity: Inactive   Days of Exercise per Week: 0 days   Minutes of Exercise per Session: 0 min  Stress: No Stress Concern Present   Feeling of Stress : Not at all  Social Connections: Moderately Isolated   Frequency of Communication with Friends and Family: More than three times a week   Frequency of Social Gatherings with Friends and Family: More than three times a week   Attends Religious Services: Never   Marine scientist or Organizations: Yes   Attends Archivist Meetings: Never   Marital Status: Divorced  Human resources officer Violence: Not At Risk   Fear of Current or Ex-Partner: No   Emotionally Abused: No   Physically Abused: No   Sexually Abused: No     Review of  Systems     Objective:   Physical Exam Vitals reviewed.  Constitutional:      General: She is not in acute distress.    Appearance: Normal appearance. She is not ill-appearing or toxic-appearing.  HENT:     Right Ear: Tympanic membrane and ear canal normal.     Left Ear: Tympanic membrane and ear canal normal.     Nose: No congestion or rhinorrhea.  Cardiovascular:     Rate and Rhythm: Normal rate and regular rhythm.     Heart sounds: Normal heart sounds.  Pulmonary:     Effort: Pulmonary effort is normal. No respiratory distress.     Breath sounds: No stridor or decreased air movement. Wheezing present. No rales.  Abdominal:     General: Bowel sounds are normal. There is no distension.     Palpations: Abdomen is soft.     Tenderness: There is no abdominal tenderness. There is no guarding.  Musculoskeletal:     Right lower leg: Edema present.     Left lower leg: Edema present.  Skin:    Findings: No erythema.  Neurological:     Mental Status: She is alert and oriented to person, place, and time.     Cranial Nerves: No cranial nerve deficit.     Sensory: No sensory deficit.     Motor: No weakness.     Coordination: Coordination normal.        Assessment & Plan:  Anemia, unspecified type - Plan: CBC with Differential/Platelet  Stage 3b chronic kidney disease ([place]) - Plan: COMPLETE METABOLIC PANEL WITH GFR  HFrEF (heart failure with reduced ejection fraction) (HCC)  Increase Lasix back to 40 mg a day.  Recheck renal function next week when she sees her nephrologist.  Repeat a CBC today.  If hemoglobin is stable or improved I will repeat it in 1 month and then resume regular schedule office visit at that point.

## 2021-02-25 LAB — COMPLETE METABOLIC PANEL WITH GFR
AG Ratio: 1.7 (calc) (ref 1.0–2.5)
ALT: 31 U/L — ABNORMAL HIGH (ref 6–29)
AST: 28 U/L (ref 10–35)
Albumin: 3.6 g/dL (ref 3.6–5.1)
Alkaline phosphatase (APISO): 82 U/L (ref 37–153)
BUN/Creatinine Ratio: 22 (calc) (ref 6–22)
BUN: 26 mg/dL — ABNORMAL HIGH (ref 7–25)
CO2: 25 mmol/L (ref 20–32)
Calcium: 8.9 mg/dL (ref 8.6–10.4)
Chloride: 106 mmol/L (ref 98–110)
Creat: 1.18 mg/dL — ABNORMAL HIGH (ref 0.60–1.00)
Globulin: 2.1 g/dL (calc) (ref 1.9–3.7)
Glucose, Bld: 119 mg/dL — ABNORMAL HIGH (ref 65–99)
Potassium: 4.9 mmol/L (ref 3.5–5.3)
Sodium: 142 mmol/L (ref 135–146)
Total Bilirubin: 0.4 mg/dL (ref 0.2–1.2)
Total Protein: 5.7 g/dL — ABNORMAL LOW (ref 6.1–8.1)
eGFR: 49 mL/min/{1.73_m2} — ABNORMAL LOW (ref >=60)

## 2021-02-25 LAB — CBC WITH DIFFERENTIAL/PLATELET
Absolute Monocytes: 1451 cells/uL — ABNORMAL HIGH (ref 200–950)
Basophils Absolute: 89 cells/uL (ref 0–200)
Basophils Relative: 0.5 %
Eosinophils Absolute: 319 cells/uL (ref 15–500)
Eosinophils Relative: 1.8 %
HCT: 26.9 % — ABNORMAL LOW (ref 35.0–45.0)
Hemoglobin: 8.3 g/dL — ABNORMAL LOW (ref 11.7–15.5)
Lymphs Abs: 850 cells/uL (ref 850–3900)
MCH: 30.3 pg (ref 27.0–33.0)
MCHC: 30.9 g/dL — ABNORMAL LOW (ref 32.0–36.0)
MCV: 98.2 fL (ref 80.0–100.0)
MPV: 10.2 fL (ref 7.5–12.5)
Monocytes Relative: 8.2 %
Neutro Abs: 14992 cells/uL — ABNORMAL HIGH (ref 1500–7800)
Neutrophils Relative %: 84.7 %
Platelets: 404 10*3/uL — ABNORMAL HIGH (ref 140–400)
RBC: 2.74 10*6/uL — ABNORMAL LOW (ref 3.80–5.10)
RDW: 17.5 % — ABNORMAL HIGH (ref 11.0–15.0)
Total Lymphocyte: 4.8 %
WBC: 17.7 10*3/uL — ABNORMAL HIGH (ref 3.8–10.8)

## 2021-02-27 ENCOUNTER — Other Ambulatory Visit: Payer: Self-pay

## 2021-02-27 ENCOUNTER — Telehealth: Payer: Self-pay

## 2021-02-27 ENCOUNTER — Telehealth: Payer: Self-pay | Admitting: Physician Assistant

## 2021-02-27 DIAGNOSIS — D509 Iron deficiency anemia, unspecified: Secondary | ICD-10-CM

## 2021-02-27 NOTE — Telephone Encounter (Signed)
Patient aware of results and recommendations. °

## 2021-02-27 NOTE — Telephone Encounter (Signed)
-----   Message from [name], MD sent at 02/27/2021  7:04 AM EST ----- Hemoglobin has dropped to 8.3.  I feel she needs to hold Xarelto and follow-up with GI as she is likely losing blood gradually from a gastrointestinal source.

## 2021-02-27 NOTE — Telephone Encounter (Signed)
Scheduled appt per 1/9 referral. Pt is aware of appt date and time.

## 2021-02-28 ENCOUNTER — Telehealth (HOSPITAL_BASED_OUTPATIENT_CLINIC_OR_DEPARTMENT_OTHER): Payer: Self-pay | Admitting: Family

## 2021-02-28 ENCOUNTER — Ambulatory Visit: Payer: Medicare PPO | Admitting: Allergy and Immunology

## 2021-02-28 ENCOUNTER — Other Ambulatory Visit: Payer: Self-pay

## 2021-02-28 VITALS — BP 138/84 | HR 82 | Temp 96.9°F | Resp 20 | Ht 60.0 in | Wt 128.0 lb

## 2021-02-28 DIAGNOSIS — K219 Gastro-esophageal reflux disease without esophagitis: Secondary | ICD-10-CM | POA: Diagnosis not present

## 2021-02-28 DIAGNOSIS — D801 Nonfamilial hypogammaglobulinemia: Secondary | ICD-10-CM | POA: Diagnosis not present

## 2021-02-28 DIAGNOSIS — G500 Trigeminal neuralgia: Secondary | ICD-10-CM | POA: Diagnosis not present

## 2021-02-28 DIAGNOSIS — J4550 Severe persistent asthma, uncomplicated: Secondary | ICD-10-CM | POA: Diagnosis not present

## 2021-02-28 DIAGNOSIS — G478 Other sleep disorders: Secondary | ICD-10-CM

## 2021-02-28 DIAGNOSIS — J3089 Other allergic rhinitis: Secondary | ICD-10-CM | POA: Diagnosis not present

## 2021-02-28 MED ORDER — POTASSIUM CHLORIDE CRYS ER 20 MEQ PO TBCR: 20.0000 meq | EXTENDED_RELEASE_TABLET | Freq: Every day | ORAL | 3 refills | Status: DC

## 2021-02-28 NOTE — Patient Instructions (Addendum)
°  1. 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 Periactin 4 mg tablet  2 tablets at bedtime  2. Continue to Treat laryngopharyngeal reflux:    A. Dexilant 60 mg in the morning  B. famotidine 40 mg daily  3.  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  Continue Hizentra infusions  4.  Use Astelin and Zyrtec and nasal saline if needed  5.  Continue therapy directed by pulmonology and rheumatology  6.  Return to clinic in 3 months or earlier if problem  7.  Check immunoglobulin levels during next visit.

## 2021-02-28 NOTE — Addendum Note (Signed)
Addended by: Gerald Stabs on: 02/28/2021 02:05 PM   Modules accepted: Orders

## 2021-02-28 NOTE — Telephone Encounter (Signed)
RN called patient to tell her to continue taking Potassium 59mEq daily. Will send to her preferred pharmacy.      "During recent clinic visit 02/24/21 patient noted she was taking Potassium 39mEq daily. Her PCP had recently increased her Lasix to 40mg  QD. Patient was unaware Potassium stopped during recent admission. Labs by PCP 02/24/21 resulted with K 4.9.    To prevent hypokalemia - recommend she continue Potassium 63mEq daily. Will ask RN to contact her with these recommendations and provide updated Rx.    Loel Dubonnet, NP "

## 2021-02-28 NOTE — Progress Notes (Signed)
[facility] - Eb[facility] Scott - Ebony Scott   Follow-up Note  Referring Provider: [name], MD Primary Provider: [name], MD Date of Office Visit: 02/28/2021  Subjective:   Ebony Scott (DOB: 12/10/1950) is a 71 y.o. 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  Her last visit to this clinic was 01 November 2020.  She has had a new development in her health status.  She apparently had a significant GI bleed with a hemoglobin down around 6 that required hospitalization and significant treatment including blood transfusions and iron infusions December 2022.  Fortunately, it does not sound as though she is bleeding at this Scott in time.  Her hemoglobin is not really bouncing back very much and she is scheduled to see a hematologist.  And she had placement of a coronary artery stent November 2022.  It does not sound as though she has required a systemic steroid above her daily systemic steroid use to treat a respiratory tract or other type of infection since she has been using Hizentra infusions for her hypogammaglobulinemia.  She is having some problems getting her hypogammaglobulinemia infusions administered on a consistent basis because of the home health agency issue.  She thinks that she has this worked out at this [facility].  She has starting to redevelop some problems with her left-sided facial pain radiating out to her left ear.  She was taken off her Periactin sometime around her hospitalization.  Her sleep is not as good as well without the use of Periactin.  She still continues to have chronic respiratory tract problems followed by pulmonology.  She is currently on  systemic steroids at 7 mg/day.  She still has chronic cough and chronic wheezing.  It sounds as though her reflux is under good control at this Scott in time.  Allergies as of 02/28/2021       Reactions   Baclofen Other (See Comments)   Altered mental status requiring a 3-day hospital stay- patient became unresponsive   Dust Mite Extract Shortness Of Breath, Other (See Comments)   "sneezing" (02/20/2012)   Molds & Smuts Shortness Of Breath   Morphine And Related Hives, Itching   Other Shortness Of Breath, Other (See Comments)   Grass and weeds "sneezing; filled sinuses" (02/20/2012)   Penicillins Rash, Other (See Comments)   "welts" (02/20/2012) Tolerated Ancef 02/16/20.   Rofecoxib Swelling, Other (See Comments)   Vioxx- feet swelling   Shellfish Allergy Anaphylaxis, Shortness Of Breath, Itching, Rash   Tetracycline Hcl Nausea And Vomiting   Xolair [omalizumab] Other (See Comments)   Caused Blood clot   Zoledronic Acid Other (See Comments)   Fever, Put in hospital, dr said it was a reaction from a reaction  Other reaction(s): Unknown   Dilaudid [hydromorphone Hcl] Itching   Levofloxacin Other (See Comments)   GI upset   Oxycodone Hcl Nausea And Vomiting   Paroxetine Nausea And Vomiting, Other (See Comments)   Paxil   Celecoxib Swelling, Other (See Comments)   Feet swelling   Diltiazem Hcl Swelling   Hydrocodone-acetaminophen Other (See Comments)   Unknown reaction   Lactose Intolerance (gi) Other (See Comments)   Bloating and gas   Morphine Sulfate Other (See Comments)   "Gets loopy"   Oxycodone-acetaminophen  Other (See Comments)   Unknown reaction   Rituximab    Anemia    Tree Extract Other (See Comments)   "tested and told I was allergic to it; never experienced a reaction to it" (02/20/2012)   Penicillin G Procaine Rash, Other (See Comments)   Welts, also        Medication List    acetaminophen 650 MG CR tablet Commonly known as: TYLENOL Take 1,300 mg by mouth  every 8 (eight) hours as needed for pain.   albuterol 108 (90 Base) MCG/ACT inhaler Commonly known as: VENTOLIN HFA Inhale 2 puffs into the lungs every 6 (six) hours as needed for wheezing or shortness of breath.   albuterol (2.5 MG/3ML) 0.083% nebulizer solution Commonly known as: PROVENTIL Take 3 mLs (2.5 mg total) by nebulization every 4 (four) hours as needed for wheezing or shortness of breath.   ALIGN PO Take 1 capsule by mouth daily.   allopurinol 100 MG tablet Commonly known as: ZYLOPRIM Take 1 tablet (100 mg total) by mouth daily.   Alpha-Lipoic Acid 600 MG Tabs Take 600 mg by mouth daily.   atorvastatin 80 MG tablet Commonly known as: LIPITOR Take 1 tablet (80 mg total) by mouth daily.   azelastine 0.1 % nasal spray Commonly known as: ASTELIN Use in each nostril as directed   BEANO PO Take 2 tablets by mouth 3 (three) times daily with meals.   benzonatate 200 MG capsule Commonly known as: TESSALON Take 1 capsule (200 mg total) by mouth 3 (three) times daily as needed for cough.   BLOOD GLUCOSE TEST STRIPS Strp Please dispense per patient and insurance preference. Use as directed to monitor FSBS 4x . Dx: E11.65   budesonide 0.5 MG/2ML nebulizer solution Commonly known as: Pulmicort Take 2 mLs (0.5 mg total) by nebulization 2 (two) times daily as needed.   CALCIUM-MAGNESIUM PO Take 1 tablet by mouth every evening.   cetirizine 10 MG tablet Commonly known as: ZYRTEC Take 1 tablet (10 mg total) by mouth daily. What changed: when to take this   cholecalciferol 25 MCG (1000 UNIT) tablet Commonly known as: VITAMIN D 1,000 Units every evening.   clopidogrel 75 MG tablet Commonly known as: Plavix Take 1 tablet (75 mg total) by mouth daily.   dexlansoprazole 60 MG capsule Commonly known as: Dexilant Take 1 capsule (60 mg total) by mouth daily.   diclofenac Sodium 1 % Gel Commonly known as: VOLTAREN Apply 1 application topically 4 (four) times daily as  needed (pain).   empagliflozin 10 MG Tabs tablet Commonly known as: Jardiance Take 1 tablet (10 mg total) by mouth daily before breakfast.   EPINEPHrine 0.3 mg/0.3 mL Soaj injection Commonly known as: EPI-PEN USE AS DIRECTED BY YOUR PHYSICIAN INTRAMUSCULARLY AS NEEDED FOR ANAPHYLAXIS   famotidine 20 MG tablet Commonly known as: PEPCID Take 1 tablet (20 mg total) by mouth daily. What changed: when to take this   fluticasone 50 MCG/ACT nasal spray Commonly known as: FLONASE USE 2 SPRAYS IN EACH NOSTRIL DAILY   folic acid 1 MG tablet Commonly known as: FOLVITE Take 1 tablet (1 mg total) by mouth daily.   furosemide 40 MG tablet Commonly known as: LASIX Take 1 tablet (40 mg total) by mouth daily. Take 1 table twice a day for 3 days and then back to once daily.   Gerhardt's butt cream Crea Apply topically 3 times a day as needed for itching or irritation   guaiFENesin 600 MG 12 hr tablet  Commonly known as: MUCINEX Take 1 tablet (600 mg total) by mouth 2 (two) times daily as needed for cough or to loosen phlegm.   Hizentra 2 GM/10ML Soln Generic drug: Immune Globulin (Human) Inject 1 Dose into the skin once a week.   LACTOSE FAST ACTING RELIEF PO Take 3 tablets by mouth in the morning, at noon, and at bedtime.   magnesium oxide 400 MG tablet Commonly known as: MAG-OX Take 400 mg by mouth every evening.   montelukast 10 MG tablet Commonly known as: SINGULAIR Take 1 tablet (10 mg total) by mouth daily.   nystatin 100000 UNIT/ML suspension Commonly known as: MYCOSTATIN Use as directed 15 mLs in the mouth or throat at bedtime as needed (mouth sores).   nystatin ointment Commonly known as: MYCOSTATIN Apply 1 application topically 2 (two) times daily as needed (yeast).   pantoprazole 40 MG tablet Commonly known as: PROTONIX Take 1 tablet (40 mg total) by mouth daily.   potassium chloride SA 20 MEQ tablet Commonly known as: Klor-Con M20 Take 1 tablet (20 mEq total)  by mouth daily. Started by: Loel Dubonnet, NP   predniSONE 1 MG tablet Commonly known as: DELTASONE Take 3 mg by mouth as directed. Take along with 5 mg tablet=8 mg   PRENATAL VITAMINS PO Take 1 tablet by mouth daily.   PRESCRIPTION MEDICATION Place 4 drops into both ears See admin instructions. Chloramp- sulfu - ampho d-h droc 100-100-5 hydrocortisone, otic ear drops. Instill 4 drops in to each ear daily for the first 5 days of each month   Stiolto Respimat 2.5-2.5 MCG/ACT Aers Generic drug: Tiotropium Bromide-Olodaterol Inhale 2 puffs into the lungs daily.   Systane Complete 0.6 % Soln Generic drug: Propylene Glycol Place 1 drop into both eyes 2 (two) times daily.   traMADol 50 MG tablet Commonly known as: ULTRAM Take 1 tablet (50 mg total) by mouth at bedtime as needed.   Xarelto 20 MG Tabs tablet Generic drug: rivaroxaban TAKE 1 TABLET BY MOUTH DAILY WITH SUPPER    Past Medical History:  Diagnosis Date   Abscess of dorsum of right hand 09/22/2020   Acute deep vein thrombosis (DVT) of right lower extremity ([place]) 12/13/2017   Allergic rhinitis    Anemia    Angio-edema    Anxiety    pt denies   Arthritis    Phreesia 10/21/2019   Asthma    Phreesia 10/21/2019   Breast cancer ([place]) 1998   in remission   Cataract    REMOVED   Clostridium difficile colitis 01/14/2018   Clostridium difficile diarrhea 70/62/3762   Complication of anesthesia    "had hard time waking up from it several times" (02/20/2012)   Depression    "some; don't take anything for it" (02/20/2012)   Diverticulosis    DVT (deep venous thrombosis) ([place])    Fibromyalgia 11/2011   GERD (gastroesophageal reflux disease)    Graves disease    Headache(784.0)    "related to allergies; more at different times during the year" (02/20/2012)   Hemorrhoids    Hiatal hernia    back and neck   Hx of adenomatous colonic polyps 04/12/2016   Hypercholesteremia    good cholesterol is Ebony   Hypothyroidism     IBS (irritable bowel syndrome)    Moderate persistent asthma    -FeV1 72% 2011, -IgE 102 2011, CT sinus Neg 2011   Osteoporosis    on reclast yearly   Peripheral vascular disease ([place]) 2019  DVTs   Pneumonia 04/2011; ~ 11/2011   "double; single" (02/20/2012)   Septic olecranon bursitis of right elbow 09/22/2020   Seronegative rheumatoid arthritis ([place])    Dr. Lahoma Rocker   SIRS (systemic inflammatory response syndrome) ([place]) 02/10/2018   Tracheobronchomalacia     Past Surgical History:  Procedure Laterality Date   ABDOMINAL HYSTERECTOMY N/A    Phreesia 10/21/2019   ANTERIOR AND POSTERIOR REPAIR  1990's   APPENDECTOMY     ARGON LASER APPLICATION  18/29/9371   Procedure: ARGON LASER APPLICATION;  Surgeon: Yetta Flock, MD;  Location: [facility];  Service: Gastroenterology;;   BREAST LUMPECTOMY  1998   left   BREAST SURGERY N/A    Phreesia 10/21/2019   BRONCHIAL WASHINGS  04/05/2020   Procedure: BRONCHIAL WASHINGS;  Surgeon: Freddi Starr, MD;  Location: [facility];  Service: Pulmonary;;   CARPOMETACARPEL ([place]) FUSION OF THUMB WITH AUTOGRAFT FROM RADIUS  ~ 2009   "both thumbs" (02/20/2012)   CATARACT EXTRACTION W/ INTRAOCULAR LENS  IMPLANT, BILATERAL  2012   CERVICAL DISCECTOMY  10/2001   C5-C6   CERVICAL FUSION  2003   C3-C4   CHOLECYSTECTOMY     COLONOSCOPY     CORONARY STENT INTERVENTION N/A 01/11/2021   Procedure: CORONARY STENT INTERVENTION;  Surgeon: Leonie Man, MD;  Location: [facility];  Service: Cardiovascular;  Laterality: N/A;   DEBRIDEMENT TENNIS ELBOW  ?1970's   right   ESOPHAGOGASTRODUODENOSCOPY     ESOPHAGOGASTRODUODENOSCOPY (EGD) WITH PROPOFOL N/A 02/05/2021   Procedure: ESOPHAGOGASTRODUODENOSCOPY (EGD) WITH PROPOFOL;  Surgeon: Yetta Flock, MD;  Location: [facility];  Service: Gastroenterology;  Laterality: N/A;   EYE SURGERY N/A    Phreesia 10/21/2019   HYSTERECTOMY     I & D EXTREMITY Right 09/22/2020   Procedure:  IRRIGATION AND DEBRIDEMENT RIGHT HAND AND ELBOW;  Surgeon: Marchia Bond, MD;  Location: [facility];  Service: Orthopedics;  Laterality: Right;   KNEE ARTHROPLASTY  ?1990's   "?right; w/cartilage repair" (02/20/2012)   NASAL SEPTUM SURGERY  1980's   POSTERIOR CERVICAL FUSION/FORAMINOTOMY  2004   "failed initial fusion; rewired  anterior neck" (02/20/2012)   REVERSE SHOULDER ARTHROPLASTY Right 02/16/2020   Procedure: REVERSE SHOULDER ARTHROPLASTY;  Surgeon: Marchia Bond, MD;  Location: [facility];  Service: Orthopedics;  Laterality: Right;   RIGHT/LEFT HEART CATH AND CORONARY ANGIOGRAPHY N/A 01/11/2021   Procedure: RIGHT/LEFT HEART CATH AND CORONARY ANGIOGRAPHY;  Surgeon: Leonie Man, MD;  Location: [facility];  Service: Cardiovascular;  Laterality: N/A;   SPINE SURGERY N/A    Phreesia 10/21/2019   TONSILLECTOMY  ~ 1953   VESICOVAGINAL FISTULA CLOSURE W/ TAH  1988   VIDEO BRONCHOSCOPY Bilateral 08/23/2016   Procedure: VIDEO BRONCHOSCOPY WITH FLUORO;  Surgeon: Javier Glazier, MD;  [facility]: [facility];  Service: Cardiopulmonary;  Laterality: Bilateral;   VIDEO BRONCHOSCOPY N/A 04/05/2020   Procedure: VIDEO BRONCHOSCOPY WITHOUT FLUORO;  Surgeon: Freddi Starr, MD;  [facility];  Service: Pulmonary;  Laterality: N/A;    Review of systems negative except as noted in HPI / PMHx or noted below:  Review of Systems  Constitutional: Negative.   HENT: Negative.    Eyes: Negative.   Respiratory: Negative.    Cardiovascular: Negative.   Gastrointestinal: Negative.   Genitourinary: Negative.   Musculoskeletal: Negative.   Skin: Negative.   Neurological: Negative.   Endo/Heme/Allergies: Negative.   Psychiatric/Behavioral: Negative.      Objective:   Vitals:   02/28/21 1552  BP: 138/84  Pulse: 82  Resp: 20  Temp: (!) 96.9 F (36.1 C)  SpO2: 97%   Height: 5' (152.4 cm)  Weight: 128 lb (58.1 kg)   Physical Exam Constitutional:      Appearance: She is not  diaphoretic.  HENT:     Head: Normocephalic.     Right Ear: Tympanic membrane, ear canal and external ear normal.     Left Ear: Tympanic membrane, ear canal and external ear normal.     Nose: Nose normal. No mucosal edema or rhinorrhea.     Mouth/Throat:     Pharynx: Uvula midline. No oropharyngeal exudate.  Eyes:     Conjunctiva/sclera: Conjunctivae normal.  Neck:     Thyroid: No thyromegaly.     Trachea: Trachea normal. No tracheal tenderness or tracheal deviation.  Cardiovascular:     Rate and Rhythm: Normal rate and regular rhythm.     Heart sounds: Normal heart sounds, S1 normal and S2 normal. No murmur heard. Pulmonary:     Effort: No respiratory distress.     Breath sounds: Normal breath sounds. No stridor. No wheezing (Large airway sounds with both inspiration and expiration.  No Ebony-frequency wheezing detected.) or rales.  Lymphadenopathy:     Head:     Right side of head: No tonsillar adenopathy.     Left side of head: No tonsillar adenopathy.     Cervical: No cervical adenopathy.  Skin:    Findings: No erythema or rash.     Nails: There is no clubbing.  Neurological:     Mental Status: She is alert.    Diagnostics:   Results of blood tests obtained 01 November 2020 identified IgG 285 mg/DL, IgM 51 mg/DL, IgA 59 mg/DL, no protective antipneumococcal antibodies detected on a Pneumo 23 serotype assay, tetanus antitoxoid IgG antibody 0.67U/mL which is protective.  Assessment and Plan:   1. Hypogammaglobulinemia ([place])   2. Facial pain syndrome   3. Other allergic rhinitis   4. Sleep dysfunction with sleep stage disturbance   5. LPRD (laryngopharyngeal reflux disease)   6. Not well controlled severe persistent asthma   7. Asthma, severe persistent, well-controlled     1. 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 Periactin 4 mg tablet  2 tablets at bedtime  2. Continue to Treat laryngopharyngeal reflux:    A. Dexilant 60 mg in the morning  B.  famotidine 40 mg daily  3.  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  Continue Hizentra infusions  4.  Use Astelin and Zyrtec and nasal saline if needed  5.  Continue therapy directed by pulmonology and rheumatology  6.  Return to clinic in 3 months or earlier if problem  7.  Check immunoglobulin levels during next visit.   [place] has CVID/hypogammaglobulinemia and she needs to use Hizentra infusions on a consistent basis.  Fortunately, it sounds as though she has the logistics of administration straightened out regarding these infusions and she will be receiving these on a consistent basis.  She also has facial pain syndrome and sleep dysfunction which has flared without the use of her Periactin and I asked her to restart her Periactin.  Her LPR appears to be under pretty good treatment on her current plan.  She needs to follow-up with pulmonology regarding her asthma and chronic systemic steroid use.  We will see her back in this clinic in 3 months or earlier if there is a problem.   Allena Katz,  MD Allergy / Immunology [place]

## 2021-02-28 NOTE — Telephone Encounter (Signed)
During recent clinic visit 02/24/21 patient noted she was taking Potassium 34mEq daily. Her PCP had recently increased her Lasix to 40mg  QD. Patient was unaware Potassium stopped during recent admission. Labs by PCP 02/24/21 resulted with K 4.9.   To prevent hypokalemia - recommend she continue Potassium 68mEq daily. Will ask RN to contact her with these recommendations and provide updated Rx.   Loel Dubonnet, NP

## 2021-03-01 MED ORDER — CETIRIZINE HCL 10 MG PO TABS: 10.0000 mg | ORAL_TABLET | Freq: Two times a day (BID) | ORAL | 5 refills | Status: DC | PRN

## 2021-03-01 MED ORDER — ALBUTEROL SULFATE HFA 108 (90 BASE) MCG/ACT IN AERS: 2.0000 | INHALATION_SPRAY | Freq: Four times a day (QID) | RESPIRATORY_TRACT | 1 refills | Status: DC | PRN

## 2021-03-01 MED ORDER — FAMOTIDINE 20 MG PO TABS: 20.0000 mg | ORAL_TABLET | Freq: Every evening | ORAL | 5 refills | Status: DC

## 2021-03-01 MED ORDER — MONTELUKAST SODIUM 10 MG PO TABS: 10.0000 mg | ORAL_TABLET | Freq: Every day | ORAL | 5 refills | Status: DC

## 2021-03-01 MED ORDER — SYSTANE COMPLETE 0.6 % OP SOLN: 1.0000 [drp] | Freq: Two times a day (BID) | OPHTHALMIC | 5 refills | Status: DC

## 2021-03-02 DIAGNOSIS — I509 Heart failure, unspecified: Secondary | ICD-10-CM | POA: Diagnosis not present

## 2021-03-02 DIAGNOSIS — D631 Anemia in chronic kidney disease: Secondary | ICD-10-CM | POA: Diagnosis not present

## 2021-03-02 DIAGNOSIS — N1832 Chronic kidney disease, stage 3b: Secondary | ICD-10-CM | POA: Diagnosis not present

## 2021-03-02 DIAGNOSIS — M109 Gout, unspecified: Secondary | ICD-10-CM | POA: Diagnosis not present

## 2021-03-03 ENCOUNTER — Encounter: Payer: Self-pay | Admitting: Family Medicine

## 2021-03-03 ENCOUNTER — Encounter (HOSPITAL_BASED_OUTPATIENT_CLINIC_OR_DEPARTMENT_OTHER): Payer: Self-pay

## 2021-03-03 NOTE — Telephone Encounter (Signed)
Ms. Makeda sent in her BP readings for you!

## 2021-03-03 NOTE — Progress Notes (Signed)
Prolia has been approved 02/19/2021-02/18/2022 per [facility].

## 2021-03-10 DIAGNOSIS — H6123 Impacted cerumen, bilateral: Secondary | ICD-10-CM | POA: Diagnosis not present

## 2021-03-13 NOTE — Progress Notes (Signed)
[place] Telephone:([phone]   Fax:([phone]) [place] NOTE  Patient Care Team: [name], MD as PCP - General (Family Medicine) Buford Dresser, MD as PCP - Cardiology (Cardiology)  Hematological/Oncological History #Iron deficiency anemia: 1) 02/04/2021-02/07/2021: Admitted for iron deficiency anemia after presenting with Hgb of 6.5.  Patient underwent EGD 12/18 which revealed gastric antral vascular ectasia, treated with APC. Aspirin/plavix/xarelto held during hospitalization. Patient was given 1 unit of PRBC and IV venofer 200 mg x 1. Discussed with cardiology with recommendations to stop aspirin but patient should continue on Plavix. Xarelto was also resumed due to history of recurrent DVT/PE.   2) 02/24/2021: Hgb 8.3, MCV 98.2, Plt 404K, Iron 14, Iron Saturation 3.4%, Ferritin 74.6.  3) 03/14/2021: Establish care with [facility] Hematology/Oncology  CHIEF COMPLAINTS/PURPOSE OF CONSULTATION:  "Iron deficiency anemia "  HISTORY OF PRESENTING ILLNESS:  Ebony Scott 71 y.o. female presents for initial evaluation for iron deficiency anemia. Patient was admitted in December 2022 for iron deficiency secondary to [place] that was treated with APC. Patient received 1 unit of PRB and IV venofer 200 mg   On exam today,  Ebony Scott reports progressive fatigue over the last several weeks.  She requires frequent resting and difficulty completing some of her daily activities her appetite is unchanged without any significant weight loss.  Patient denies any nausea, vomiting or abdominal pain.  Her bowel habits are regular without any diarrhea or constipation.  Patient reports persistent shortness of breath with exertion.  She does have asthma but reports that symptoms have worsened in the last several weeks.  Patient has occasional episodes of headaches.  She denies easy bruising or signs of active bleeding.  She has neuropathy in her lower extremities. She denies  fevers, chills, night sweats, chest pain or cough.  She has no other complaints.  Rest of the 10 point ROS is below.  MEDICAL HISTORY:  Past Medical History:  Diagnosis Date   Abscess of dorsum of right hand 09/22/2020   Acute deep vein thrombosis (DVT) of right lower extremity ([place]) 12/13/2017   Allergic rhinitis    Anemia    Angio-edema    Anxiety    pt denies   Arthritis    Phreesia 10/21/2019   Asthma    Phreesia 10/21/2019   Breast cancer ([place]) 1998   in remission   Cataract    REMOVED   Clostridium difficile colitis 01/14/2018   Clostridium difficile diarrhea 51/03/5850   Complication of anesthesia    "had hard time waking up from it several times" (02/20/2012)   Depression    "some; don't take anything for it" (02/20/2012)   Diverticulosis    DVT (deep venous thrombosis) ([place])    Fibromyalgia 11/2011   GERD (gastroesophageal reflux disease)    Graves disease    Headache(784.0)    "related to allergies; more at different times during the year" (02/20/2012)   Hemorrhoids    Hiatal hernia    back and neck   Hx of adenomatous colonic polyps 04/12/2016   Hypercholesteremia    good cholesterol is high   Hypothyroidism    IBS (irritable bowel syndrome)    Moderate persistent asthma    -FeV1 72% 2011, -IgE 102 2011, CT sinus Neg 2011   Osteoporosis    on reclast yearly   Peripheral vascular disease ([place]) 2019   DVTs   Pneumonia 04/2011; ~ 11/2011   "double; single" (02/20/2012)   Septic olecranon bursitis of right  elbow 09/22/2020   Seronegative rheumatoid arthritis ([place])    Dr. Lahoma Rocker   SIRS (systemic inflammatory response syndrome) ([place]) 02/10/2018   Tracheobronchomalacia     SURGICAL HISTORY: Past Surgical History:  Procedure Laterality Date   ABDOMINAL HYSTERECTOMY N/A    Phreesia 10/21/2019   ANTERIOR AND POSTERIOR REPAIR  1990's   APPENDECTOMY     ARGON LASER APPLICATION  53/97/6734   Procedure: ARGON LASER APPLICATION;  Surgeon: Yetta Flock, MD;  [facility]: [facility];  Service: Gastroenterology;;   BREAST LUMPECTOMY  1998   left   BREAST SURGERY N/A    Phreesia 10/21/2019   BRONCHIAL WASHINGS  04/05/2020   Procedure: BRONCHIAL WASHINGS;  Surgeon: Freddi Starr, MD;  Location: [facility];  Service: Pulmonary;;   CARPOMETACARPEL ([place]) FUSION OF THUMB WITH AUTOGRAFT FROM RADIUS  ~ 2009   "both thumbs" (02/20/2012)   CATARACT EXTRACTION W/ INTRAOCULAR LENS  IMPLANT, BILATERAL  2012   CERVICAL DISCECTOMY  10/2001   C5-C6   CERVICAL FUSION  2003   C3-C4   CHOLECYSTECTOMY     COLONOSCOPY     CORONARY STENT INTERVENTION N/A 01/11/2021   Procedure: CORONARY STENT INTERVENTION;  Surgeon: Leonie Man, MD;  Location: [facility];  Service: Cardiovascular;  Laterality: N/A;   DEBRIDEMENT TENNIS ELBOW  ?1970's   right   ESOPHAGOGASTRODUODENOSCOPY     ESOPHAGOGASTRODUODENOSCOPY (EGD) WITH PROPOFOL N/A 02/05/2021   Procedure: ESOPHAGOGASTRODUODENOSCOPY (EGD) WITH PROPOFOL;  Surgeon: Yetta Flock, MD;  [facility];  Service: Gastroenterology;  Laterality: N/A;   EYE SURGERY N/A    Phreesia 10/21/2019   HYSTERECTOMY     I & D EXTREMITY Right 09/22/2020   Procedure: IRRIGATION AND DEBRIDEMENT RIGHT HAND AND ELBOW;  Surgeon: Marchia Bond, MD;  [facility]: [facility];  Service: Orthopedics;  Laterality: Right;   KNEE ARTHROPLASTY  ?1990's   "?right; w/cartilage repair" (02/20/2012)   NASAL SEPTUM SURGERY  1980's   POSTERIOR CERVICAL FUSION/FORAMINOTOMY  2004   "failed initial fusion; rewired  anterior neck" (02/20/2012)   REVERSE SHOULDER ARTHROPLASTY Right 02/16/2020   Procedure: REVERSE SHOULDER ARTHROPLASTY;  Surgeon: Marchia Bond, MD;  [facility];  Service: Orthopedics;  Laterality: Right;   RIGHT/LEFT HEART CATH AND CORONARY ANGIOGRAPHY N/A 01/11/2021   Procedure: RIGHT/LEFT HEART CATH AND CORONARY ANGIOGRAPHY;  Surgeon: Leonie Man, MD;  Location: [facility];  Service:  Cardiovascular;  Laterality: N/A;   SPINE SURGERY N/A    Phreesia 10/21/2019   TONSILLECTOMY  ~ 1953   VESICOVAGINAL FISTULA CLOSURE W/ TAH  1988   VIDEO BRONCHOSCOPY Bilateral 08/23/2016   Procedure: VIDEO BRONCHOSCOPY WITH FLUORO;  Surgeon: Javier Glazier, MD;  [facility];  Service: Cardiopulmonary;  Laterality: Bilateral;   VIDEO BRONCHOSCOPY N/A 04/05/2020   Procedure: VIDEO BRONCHOSCOPY WITHOUT FLUORO;  Surgeon: Freddi Starr, MD;  [facility];  Service: Pulmonary;  Laterality: N/A;    SOCIAL HISTORY: Social History   Socioeconomic History   Marital status: Divorced    Spouse name: Not on file   Number of children: 2   Years of education: college   Highest education level: Not on file  Occupational History   Occupation: Disabled    Comment: Retired Engineer, production: RETIRED  Tobacco Use   Smoking status: Never    Passive exposure: Yes   Smokeless tobacco: Never   Tobacco comments:    Parents  Vaping Use   Vaping Use:  Never used  Substance and Sexual Activity   Alcohol use: Not Currently    Alcohol/week: 0.0 standard drinks   Drug use: No   Sexual activity: Never  Other Topics Concern   Not on file  Social History Narrative   Patient lives at home alone. Patient  divorced.    Patient has her BS degree.   Right handed.   Caffeine- sometimes coffee.      [facility]:   Born in [place], [place]. She worked as a Copywriter, advertising. She has no pets currently. She does have indoor plants. Previously had mold in her home that was remediated. Carpet was removed.          Social Determinants of Health   Financial Resource Strain: Low Risk    Difficulty of Paying Living Expenses: Not hard at all  Food Insecurity: No Food Insecurity   Worried About Charity fundraiser in the Last Year: Never true   [place] in the Last Year: Never true  Transportation Needs: No Transportation Needs   Lack of  Transportation (Medical): No   Lack of Transportation (Non-Medical): No  Physical Activity: Inactive   Days of Exercise per Week: 0 days   Minutes of Exercise per Session: 0 min  Stress: No Stress Concern Present   Feeling of Stress : Not at all  Social Connections: Moderately Isolated   Frequency of Communication with Friends and Family: More than three times a week   Frequency of Social Gatherings with Friends and Family: More than three times a week   Attends Religious Services: Never   Marine scientist or Organizations: Yes   Attends Archivist Meetings: Never   Marital Status: Divorced  Human resources officer Violence: Not At Risk   Fear of Current or Ex-Partner: No   Emotionally Abused: No   Physically Abused: No   Sexually Abused: No    FAMILY HISTORY: Family History  Problem Relation Age of Onset   Allergies Mother    Heart disease Mother    Arthritis Mother    Lung cancer Mother        heavy smoker   Diabetes Mother    Allergies Father    Heart disease Father    Arthritis Father    Stroke Father    Heart disease Brother    Diabetes Maternal Grandfather    Colitis Daughter    Colon cancer Other        Maternal half uncle    ALLERGIES:  is allergic to baclofen, dust mite extract, molds & smuts, morphine and related, other, penicillins, rofecoxib, shellfish allergy, tetracycline hcl, xolair [omalizumab], zoledronic acid, dilaudid [hydromorphone hcl], levofloxacin, oxycodone hcl, paroxetine, celecoxib, diltiazem hcl, hydrocodone-acetaminophen, lactose intolerance (gi), morphine sulfate, oxycodone-acetaminophen, rituximab, tree extract, and penicillin g procaine.  MEDICATIONS:  Current Outpatient Medications  Medication Sig Dispense Refill   acetaminophen (TYLENOL) 650 MG CR tablet Take 1,300 mg by mouth every 8 (eight) hours as needed for pain.     albuterol (PROVENTIL) (2.5 MG/3ML) 0.083% nebulizer solution Take 3 mLs (2.5 mg total) by nebulization every  4 (four) hours as needed for wheezing or shortness of breath. 75 mL 12   albuterol (VENTOLIN HFA) 108 (90 Base) MCG/ACT inhaler Inhale 2 puffs into the lungs every 6 (six) hours as needed for wheezing or shortness of breath. 19 g 1   allopurinol (ZYLOPRIM) 100 MG tablet Take 1 tablet (100 mg total) by mouth daily. 90 tablet 3  Alpha-D-Galactosidase (BEANO PO) Take 2 tablets by mouth 3 (three) times daily with meals.     Alpha-Lipoic Acid 600 MG TABS Take 600 mg by mouth daily.     atorvastatin (LIPITOR) 80 MG tablet Take 1 tablet (80 mg total) by mouth daily. 90 tablet 3   azelastine (ASTELIN) 0.1 % nasal spray Use in each nostril as directed (Patient taking differently: 2 sprays daily as needed for allergies.) 30 mL 5   benzonatate (TESSALON) 200 MG capsule Take 1 capsule (200 mg total) by mouth 3 (three) times daily as needed for cough. 30 capsule 0   budesonide (PULMICORT) 0.5 MG/2ML nebulizer solution Take 2 mLs (0.5 mg total) by nebulization 2 (two) times daily as needed. 120 mL 12   CALCIUM-MAGNESIUM PO Take 1 tablet by mouth every evening.     cetirizine (ZYRTEC) 10 MG tablet Take 1 tablet (10 mg total) by mouth 2 (two) times daily as needed for allergies (Can take a n extra dose during flare ups.). 60 tablet 5   cholecalciferol (VITAMIN D) 25 MCG (1000 UNIT) tablet 1,000 Units every evening.     clopidogrel (PLAVIX) 75 MG tablet Take 1 tablet (75 mg total) by mouth daily. 90 tablet 3   dexlansoprazole (DEXILANT) 60 MG capsule Take 1 capsule (60 mg total) by mouth daily. 90 capsule 3   diclofenac Sodium (VOLTAREN) 1 % GEL Apply 1 application topically 4 (four) times daily as needed (pain).     empagliflozin (JARDIANCE) 10 MG TABS tablet Take 1 tablet (10 mg total) by mouth daily before breakfast. 30 tablet 5   EPINEPHrine 0.3 mg/0.3 mL IJ SOAJ injection USE AS DIRECTED BY YOUR PHYSICIAN INTRAMUSCULARLY AS NEEDED FOR ANAPHYLAXIS 2 each 1   famotidine (PEPCID) 20 MG tablet Take 1 tablet (20 mg  total) by mouth at bedtime. 30 tablet 5   fluticasone (FLONASE) 50 MCG/ACT nasal spray USE 2 SPRAYS IN EACH NOSTRIL DAILY (Patient taking differently: 2 sprays daily as needed for allergies.) 16 g 2   folic acid (FOLVITE) 1 MG tablet Take 1 tablet (1 mg total) by mouth daily. 90 tablet 3   furosemide (LASIX) 40 MG tablet Take 1 tablet (40 mg total) by mouth daily. Take 1 table twice a day for 3 days and then back to once daily. 30 tablet 2   guaiFENesin (MUCINEX) 600 MG 12 hr tablet Take 1 tablet (600 mg total) by mouth 2 (two) times daily as needed for cough or to loosen phlegm. (Patient taking differently: Take 600 mg by mouth 2 (two) times daily.) 60 tablet 1   HIZENTRA 2 GM/10ML SOLN Inject 1 Dose into the skin once a week.     Lactase (LACTOSE FAST ACTING RELIEF PO) Take 3 tablets by mouth in the morning, at noon, and at bedtime.     magnesium oxide (MAG-OX) 400 MG tablet Take 400 mg by mouth every evening.     montelukast (SINGULAIR) 10 MG tablet Take 1 tablet (10 mg total) by mouth daily. 30 tablet 5   Nystatin (GERHARDT'S BUTT CREAM) CREA Apply topically 3 times a day as needed for itching or irritation 4 each 6   nystatin (MYCOSTATIN) 100000 UNIT/ML suspension Use as directed 15 mLs in the mouth or throat at bedtime as needed (mouth sores).     nystatin ointment (MYCOSTATIN) Apply 1 application topically 2 (two) times daily as needed (yeast).     pantoprazole (PROTONIX) 40 MG tablet Take 1 tablet (40 mg total) by mouth daily.  30 tablet 3   potassium chloride SA (KLOR-CON M20) 20 MEQ tablet Take 1 tablet (20 mEq total) by mouth daily. 90 tablet 3   predniSONE (DELTASONE) 1 MG tablet Take 3 mg by mouth as directed. Take along with 5 mg tablet=8 mg     Prenatal Vit-Fe Fumarate-FA (PRENATAL VITAMINS PO) Take 1 tablet by mouth daily.     PRESCRIPTION MEDICATION Place 4 drops into both ears See admin instructions. Chloramp- sulfu - ampho d-h droc 100-100-5 hydrocortisone, otic ear drops. Instill 4  drops in to each ear daily for the first 5 days of each month     Probiotic Product (ALIGN PO) Take 1 capsule by mouth daily.     Propylene Glycol (SYSTANE COMPLETE) 0.6 % SOLN Place 1 drop into both eyes 2 (two) times daily. 15 mL 5   Tiotropium Bromide-Olodaterol (STIOLTO RESPIMAT) 2.5-2.5 MCG/ACT AERS Inhale 2 puffs into the lungs daily. 4 g 5   traMADol (ULTRAM) 50 MG tablet Take 1 tablet (50 mg total) by mouth at bedtime as needed. 30 tablet 1   XARELTO 20 MG TABS tablet TAKE 1 TABLET BY MOUTH DAILY WITH SUPPER 90 tablet 3   Glucose Blood (BLOOD GLUCOSE TEST STRIPS) STRP Please dispense per patient and insurance preference. Use as directed to monitor FSBS 4x . Dx: E11.65 150 strip 1   No current facility-administered medications for this visit.   Facility-Administered Medications Ordered in Other Visits  Medication Dose Route Frequency Provider Last Rate Last Admin   iohexol (OMNIPAQUE) 350 MG/ML injection    PRN Leonie Man, MD   105 mL at 01/11/21 0955    REVIEW OF SYSTEMS:   Constitutional: ( - ) fevers, ( - )  chills , ( - ) night sweats Eyes: ( - ) blurriness of vision, ( - ) double vision, ( - ) watery eyes Ears, nose, mouth, throat, and face: ( - ) mucositis, ( - ) sore throat Respiratory: ( - ) cough, ( + ) dyspnea, ( - ) wheezes Cardiovascular: ( - ) palpitation, ( - ) chest discomfort, ( - ) lower extremity swelling Gastrointestinal:  ( - ) nausea, ( - ) heartburn, ( - ) change in bowel habits Skin: ( - ) abnormal skin rashes Lymphatics: ( - ) new lymphadenopathy, ( - ) easy bruising Neurological: ( + ) numbness, ( - ) tingling, ( - ) new weaknesses Behavioral/Psych: ( - ) mood change, ( - ) new changes  All other systems were reviewed with the patient and are negative.  PHYSICAL EXAMINATION: ECOG PERFORMANCE STATUS: 2 - Symptomatic, <50% confined to bed  Vitals:   03/14/21 1105  BP: (!) 139/53  Pulse: 97  Resp: 19  Temp: 98.1 F (36.7 C)  SpO2: 97%   Filed  Weights   03/14/21 1105  Weight: 124 lb 4.8 oz (56.4 kg)    GENERAL: well appearing female in NAD  SKIN: skin color, texture, turgor are normal, no rashes or significant lesions EYES: conjunctiva are pink and non-injected, sclera clear OROPHARYNX: no exudate, no erythema; lips, buccal mucosa, and tongue normal  NECK: supple, non-tender LYMPH:  no palpable lymphadenopathy in the cervical or supraclavicular lymph nodes.  LUNGS: clear to auscultation and percussion with normal breathing effort HEART: regular rate & rhythm and no murmurs and no lower extremity edema ABDOMEN: soft, non-tender, non-distended, normal bowel sounds Musculoskeletal: no cyanosis of digits and no clubbing  PSYCH: alert & oriented x 3, fluent speech NEURO: no focal  motor/sensory deficits  LABORATORY DATA:  I have reviewed the data as listed CBC Latest Ref Rng & Units 03/14/2021 02/24/2021 02/14/2021  WBC 4.0 - 10.5 K/uL 12.3(H) 17.7(H) 13.7(H)  Hemoglobin 12.0 - 15.0 g/dL 8.3(L) 8.3(L) 9.1(L)  Hematocrit 36.0 - 46.0 % 27.3(L) 26.9(L) 29.4(L)  Platelets 150 - 400 K/uL 312 404(H) 365.0    CMP Latest Ref Rng & Units 03/14/2021 02/24/2021 02/07/2021  Glucose 70 - 99 mg/dL 142(H) 119(H) 104(H)  BUN 8 - 23 mg/dL 33(H) 26(H) 27(H)  Creatinine 0.44 - 1.00 mg/dL 1.37(H) 1.18(H) 1.30(H)  Sodium 135 - 145 mmol/L 141 142 139  Potassium 3.5 - 5.1 mmol/L 4.2 4.9 3.6  Chloride 98 - 111 mmol/L 103 106 102  CO2 22 - 32 mmol/L 27 25 27   Calcium 8.9 - 10.3 mg/dL 9.6 8.9 8.2(L)  Total Protein 6.5 - 8.1 g/dL 7.5 5.7(L) -  Total Bilirubin 0.3 - 1.2 mg/dL 0.3 0.4 -  Alkaline Phos 38 - 126 U/L 97 - -  AST 15 - 41 U/L 40 28 -  ALT 0 - 44 U/L 61(H) 31(H) -    ASSESSMENT & PLAN KYASIA STEUCK is a 71 y.o. female who presents for initial evaluation for iron deficiency anemia. She is not taking oral iron supplementation due to constipation. Patient will proceed with laboratory evaluation today to check CBC, CMP and iron panel. If there is  evidence of persistent iron deficiency anemia with Hgb <10, the recommendation is IV iron infusions.   #Iron deficiency anemia 2/2 GI bleed: --EGD 02/05/21 revealed gastric antral vascular ectasia, treated with APC.  --Unable to tolerate iron pills due to constipation --Labs today to check CBC, CMP, iron and TIBC, ferritin and retic panel --Recommend IV venofer 200 mg once weekly x 5 doses. Will arrange at [facility] center.  --Under the care of [facility], last seen on 02/24/2021. Plan to hold off on further endoscopic evaluation for at least 6 months post her stent placement in order to safely take her off her blood thinners/antiplatelet agents at that time. --RTC in 10 weeks with repeat labs.   # Hx of DVT and PE: --First episode of acute DVT involving the left posterior tibial and pertoneal veins on 02/23/2012.  --Second episode acute DVT involving right peroneal vein and bilateral pulmonary emboli on 12/13/2017. Felt to be unprovoked so recommended indefinite anticoagulation.  --Currently on Xarelto 20 mg once daily. Recommend to consider maintenance dose of Xarelto 10 mg once daily to minimize bleeding risk.   #CAD s/p stent to LAD on 01/11/2021.   --Currently on Plavix.   #Seronegative rheumatoid arthritis. --Currently on Prednisone 10 mg daily.    # CVID/hypogammaglobulinemia  --currently on Hizentra infusions.  --check immunoglobulin levels today.   Orders Placed This Encounter  Procedures   CBC with Differential ([facility] Only)    Standing Status:   Future    Number of Occurrences:   1    Standing Expiration Date:   03/13/2022   CMP ([facility] only)    Standing Status:   Future    Number of Occurrences:   1    Standing Expiration Date:   03/13/2022   Ferritin    Standing Status:   Future    Number of Occurrences:   1    Standing Expiration Date:   03/13/2022   Iron and Iron Binding Capacity ([facility] only)    Standing Status:   Future    Number of  Occurrences:  1    Standing Expiration Date:   03/13/2022   Retic Panel    Standing Status:   Future    Number of Occurrences:   1    Standing Expiration Date:   03/13/2022   QIG  (Quant. immunoglobulins  - IgG, IgA, IgM)    Standing Status:   Future    Number of Occurrences:   1    Standing Expiration Date:   03/14/2022   Sample to Blood Bank    Standing Status:   Future    Number of Occurrences:   1    Standing Expiration Date:   03/14/2022    All questions were answered. The patient knows to call the clinic with any problems, questions or concerns.  I have spent a total of 60 minutes minutes of face-to-face and non-face-to-face time, preparing to see the patient, obtaining and/or reviewing separately obtained history, performing a medically appropriate examination, counseling and educating the patient, ordering medications/tests, documenting clinical information in the electronic health record, and care coordination.   Dede Query, PA-C Department of Hematology/Oncology [place] at [facility] Phone: [phone]  Patient was seen with Dr. Lorenso Courier.   I have read the above note and personally examined the patient. I agree with the assessment and plan as noted above.  Briefly Ebony Scott is a 71 year old female who presents for numerous hematological issues.  She has vascular ectasias in the stomach which causes iron deficiency anemia.  The patient has been on p.o. iron before in the past but it causes severe constipation.  Due to this we will pursue IV iron therapy instead in order to help bolster her iron levels and hemoglobin.  The patient also has a history of unprovoked VTE for which she is currently on Xarelto 20 mg p.o. daily.  Given her issues with bleeding I would recommend we consider decreasing down to maintenance dose of 10 mg instead.  Additionally we could consider transition to Eliquis therapy maintenance at 2.5 mg twice daily due to its slightly  lower bleeding risk.  Additionally she has a history of hypogammaglobinemia which is currently being followed by allergy and immunology.  We will plan to see the patient back after her IV iron infusions to assure this is adequately bolstering her hemoglobin and iron levels.   Ledell Peoples, MD Department of Hematology/Oncology [place] at [facility] Phone: [phone] Pager: [phone] Email: [email or web address]

## 2021-03-14 ENCOUNTER — Telehealth: Payer: Self-pay

## 2021-03-14 ENCOUNTER — Telehealth: Payer: Self-pay | Admitting: Pharmacy Technician

## 2021-03-14 ENCOUNTER — Inpatient Hospital Stay: Payer: Medicare PPO | Attending: Physician Assistant | Admitting: Physician Assistant

## 2021-03-14 ENCOUNTER — Encounter: Payer: Self-pay | Admitting: Physician Assistant

## 2021-03-14 ENCOUNTER — Other Ambulatory Visit: Payer: Self-pay

## 2021-03-14 ENCOUNTER — Inpatient Hospital Stay: Payer: Medicare PPO

## 2021-03-14 VITALS — BP 139/53 | HR 97 | Temp 98.1°F | Resp 19 | Ht 60.0 in | Wt 124.3 lb

## 2021-03-14 DIAGNOSIS — Z90710 Acquired absence of both cervix and uterus: Secondary | ICD-10-CM | POA: Insufficient documentation

## 2021-03-14 DIAGNOSIS — Z800 Family history of malignant neoplasm of digestive organs: Secondary | ICD-10-CM | POA: Insufficient documentation

## 2021-03-14 DIAGNOSIS — D509 Iron deficiency anemia, unspecified: Secondary | ICD-10-CM

## 2021-03-14 DIAGNOSIS — M0600 Rheumatoid arthritis without rheumatoid factor, unspecified site: Secondary | ICD-10-CM | POA: Insufficient documentation

## 2021-03-14 DIAGNOSIS — Z801 Family history of malignant neoplasm of trachea, bronchus and lung: Secondary | ICD-10-CM | POA: Diagnosis not present

## 2021-03-14 DIAGNOSIS — Z86718 Personal history of other venous thrombosis and embolism: Secondary | ICD-10-CM | POA: Insufficient documentation

## 2021-03-14 DIAGNOSIS — D801 Nonfamilial hypogammaglobulinemia: Secondary | ICD-10-CM | POA: Diagnosis not present

## 2021-03-14 DIAGNOSIS — Z86711 Personal history of pulmonary embolism: Secondary | ICD-10-CM | POA: Diagnosis not present

## 2021-03-14 DIAGNOSIS — D500 Iron deficiency anemia secondary to blood loss (chronic): Secondary | ICD-10-CM | POA: Diagnosis not present

## 2021-03-14 DIAGNOSIS — I2510 Atherosclerotic heart disease of native coronary artery without angina pectoris: Secondary | ICD-10-CM | POA: Diagnosis not present

## 2021-03-14 DIAGNOSIS — Z7901 Long term (current) use of anticoagulants: Secondary | ICD-10-CM | POA: Diagnosis not present

## 2021-03-14 LAB — CBC WITH DIFFERENTIAL (CANCER CENTER ONLY)
Abs Immature Granulocytes: 0.05 10*3/uL (ref 0.00–0.07)
Basophils Absolute: 0 10*3/uL (ref 0.0–0.1)
Basophils Relative: 0 %
Eosinophils Absolute: 0 10*3/uL (ref 0.0–0.5)
Eosinophils Relative: 0 %
HCT: 27.3 % — ABNORMAL LOW (ref 36.0–46.0)
Hemoglobin: 8.3 g/dL — ABNORMAL LOW (ref 12.0–15.0)
Immature Granulocytes: 0 %
Lymphocytes Relative: 9 %
Lymphs Abs: 1.1 10*3/uL (ref 0.7–4.0)
MCH: 28.7 pg (ref 26.0–34.0)
MCHC: 30.4 g/dL (ref 30.0–36.0)
MCV: 94.5 fL (ref 80.0–100.0)
Monocytes Absolute: 1.6 10*3/uL — ABNORMAL HIGH (ref 0.1–1.0)
Monocytes Relative: 13 %
Neutro Abs: 9.5 10*3/uL — ABNORMAL HIGH (ref 1.7–7.7)
Neutrophils Relative %: 78 %
Platelet Count: 312 10*3/uL (ref 150–400)
RBC: 2.89 MIL/uL — ABNORMAL LOW (ref 3.87–5.11)
RDW: 19 % — ABNORMAL HIGH (ref 11.5–15.5)
WBC Count: 12.3 10*3/uL — ABNORMAL HIGH (ref 4.0–10.5)
nRBC: 0 % (ref 0.0–0.2)

## 2021-03-14 LAB — CMP (CANCER CENTER ONLY)
ALT: 61 U/L — ABNORMAL HIGH (ref 0–44)
AST: 40 U/L (ref 15–41)
Albumin: 3.9 g/dL (ref 3.5–5.0)
Alkaline Phosphatase: 97 U/L (ref 38–126)
Anion gap: 11 (ref 5–15)
BUN: 33 mg/dL — ABNORMAL HIGH (ref 8–23)
CO2: 27 mmol/L (ref 22–32)
Calcium: 9.6 mg/dL (ref 8.9–10.3)
Chloride: 103 mmol/L (ref 98–111)
Creatinine: 1.37 mg/dL — ABNORMAL HIGH (ref 0.44–1.00)
GFR, Estimated: 41 mL/min — ABNORMAL LOW (ref >60)
Glucose, Bld: 142 mg/dL — ABNORMAL HIGH (ref 70–99)
Potassium: 4.2 mmol/L (ref 3.5–5.1)
Sodium: 141 mmol/L (ref 135–145)
Total Bilirubin: 0.3 mg/dL (ref 0.3–1.2)
Total Protein: 7.5 g/dL (ref 6.5–8.1)

## 2021-03-14 LAB — RETIC PANEL
Immature Retic Fract: 39.1 % — ABNORMAL HIGH (ref 2.3–15.9)
RBC.: 2.89 MIL/uL — ABNORMAL LOW (ref 3.87–5.11)
Retic Count, Absolute: 54.6 10*3/uL (ref 19.0–186.0)
Retic Ct Pct: 1.9 % (ref 0.4–3.1)
Reticulocyte Hemoglobin: 26.4 pg — ABNORMAL LOW (ref >27.9)

## 2021-03-14 LAB — IRON AND IRON BINDING CAPACITY (CC-WL,HP ONLY)
Iron: 39 ug/dL (ref 28–170)
Saturation Ratios: 9 % — ABNORMAL LOW (ref 10.4–31.8)
TIBC: 428 ug/dL (ref 250–450)
UIBC: 389 ug/dL (ref 148–442)

## 2021-03-14 LAB — SAMPLE TO BLOOD BANK

## 2021-03-14 LAB — FERRITIN: Ferritin: 68 ng/mL (ref 11–307)

## 2021-03-14 NOTE — Telephone Encounter (Signed)
Please call patiet and let her know that Hgb is stable at 8.3. No need for blood transfusion we will arrange for IV iron   Pt advised with verbal understanding

## 2021-03-14 NOTE — Telephone Encounter (Signed)
Dr. Charlies Silvers,  Auth Submission: DENIED Payer: [employer] MEDICARE Medication & CPT/J Code(s) submitted: MONOFERRIC G7354 Route of submission (phone, fax, portal): PHONE Auth type: Buy/Bill Units/visits requested: 1 Reference number: [id number]  Denied due to patient has not tried and or failed step therapy. Venofer Infed Ferrlecit  Would you like to try venofer??   If agreeable to venofer please re-enter the referral and we will schedule the patient as soon as possible.

## 2021-03-15 ENCOUNTER — Ambulatory Visit (INDEPENDENT_AMBULATORY_CARE_PROVIDER_SITE_OTHER): Payer: Medicare PPO

## 2021-03-15 ENCOUNTER — Encounter: Payer: Self-pay | Admitting: Pulmonary Disease

## 2021-03-15 ENCOUNTER — Ambulatory Visit: Payer: Medicare PPO | Admitting: Pulmonary Disease

## 2021-03-15 VITALS — BP 132/70 | HR 79 | Temp 97.5°F | Resp 18 | Ht 61.0 in | Wt 126.8 lb

## 2021-03-15 VITALS — BP 128/70 | HR 72 | Ht 61.0 in | Wt 127.0 lb

## 2021-03-15 DIAGNOSIS — J398 Other specified diseases of upper respiratory tract: Secondary | ICD-10-CM | POA: Diagnosis not present

## 2021-03-15 DIAGNOSIS — D509 Iron deficiency anemia, unspecified: Secondary | ICD-10-CM | POA: Diagnosis not present

## 2021-03-15 DIAGNOSIS — R918 Other nonspecific abnormal finding of lung field: Secondary | ICD-10-CM | POA: Diagnosis not present

## 2021-03-15 DIAGNOSIS — R053 Chronic cough: Secondary | ICD-10-CM | POA: Diagnosis not present

## 2021-03-15 DIAGNOSIS — J479 Bronchiectasis, uncomplicated: Secondary | ICD-10-CM | POA: Diagnosis not present

## 2021-03-15 LAB — IGG, IGA, IGM
IgA: 49 mg/dL — ABNORMAL LOW (ref 64–422)
IgG (Immunoglobin G), Serum: 783 mg/dL (ref 586–1602)
IgM (Immunoglobulin M), Srm: 53 mg/dL (ref 26–217)

## 2021-03-15 MED ORDER — ALBUTEROL SULFATE HFA 108 (90 BASE) MCG/ACT IN AERS: 2.0000 | INHALATION_SPRAY | Freq: Once | RESPIRATORY_TRACT | Status: DC | PRN

## 2021-03-15 MED ORDER — SODIUM CHLORIDE 0.9 % IV SOLN
200.0000 mg | Freq: Once | INTRAVENOUS | Status: AC
  Administered 2021-03-15: 14:00:00 200 mg via INTRAVENOUS
  Filled 2021-03-15: qty 10

## 2021-03-15 MED ORDER — EPINEPHRINE 0.3 MG/0.3ML IJ SOAJ: 0.3000 mg | Freq: Once | INTRAMUSCULAR | Status: DC | PRN

## 2021-03-15 MED ORDER — DIPHENHYDRAMINE HCL 50 MG/ML IJ SOLN: 50.0000 mg | Freq: Once | INTRAMUSCULAR | Status: DC | PRN

## 2021-03-15 MED ORDER — SODIUM CHLORIDE 0.9 % IV SOLN: Freq: Once | INTRAVENOUS | Status: DC | PRN

## 2021-03-15 MED ORDER — FAMOTIDINE IN NACL 20-0.9 MG/50ML-% IV SOLN: 20.0000 mg | Freq: Once | INTRAVENOUS | Status: DC | PRN

## 2021-03-15 MED ORDER — METHYLPREDNISOLONE SODIUM SUCC 125 MG IJ SOLR: 125.0000 mg | Freq: Once | INTRAMUSCULAR | Status: DC | PRN

## 2021-03-15 NOTE — Patient Instructions (Signed)
Continue stiolto 2 puffs daily  Use flutter valve 1-3 times per day to aid in mucous clearance.   Consider adding back nasal sprays to help with post-nasal drainage adding to cough and mucous production.  Follow up in 6 months.

## 2021-03-15 NOTE — Progress Notes (Signed)
Synopsis: Referred in 2011 for asthma by [name], MD.  Previously a patient of Drs. Sunnie Nielsen, McQuaid and Vonore.  Subjective:   PATIENT ID: Ebony Scott GENDER: female DOB: 11-10-1950, MRN: [id number]  HPI  Chief Complaint  Patient presents with   Follow-up    4 mo f/u. Increased fatigue to low iron levels. Increased coughing with yellow phlegm.    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   She has been doing okay since last visit.  She reports persistent wheezing and cough at night.  She is coughing up thick yellow sputum.  She is currently taking 12.5 mg of prednisone daily for her rheumatoid arthritis and is being tapered down by Dr. Tobie Poet of rheumatology.  She is receiving IVIG infusions via Dr. Neldon Mc.  She reports she is having left ear and sinus congestion with some postnasal drainage.  He has been doing okay on Stiolto inhaler.  OV 11/15/20 She reports increased wheezing, cough and dyspnea since last visit. She is having a lot of wheezing at night. She remains on stiolto respimat daily and is using albuterol nebulizer treatments twice daily with flutter valve for mucous clearance. She complains of cough when eating/drinking.  She is being started on IVIG infusions by Dr. 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 She remains on prednisone 98m daily for her rheumatoid arthritis.  We discussed her CT chest results from 08/23/20 which showed resolution of her pulmonary nodules with residual areas of scarring.  OV 06/14/20 She has completed two courses of diflucan per ENT for laryngeal candidiasis and her voice is much improved. We discontinued her inhaled steroid during this period of time as well. She is noticing more shortness of breath during the day.    She was recently transitioned to rituximab by her rheumatoligist and has been taken off liflunamide. She continues on 110mof prednisone daily.   CT Chest on 4/6 when compared to the CT Chest on 2/4 shows a decrease in the size of the nodular/consolidative opacities of the medial right lung base and left lower lobe. No new nodules or lesions noted.   OV 04/29/20 She underwent bronchoscopy on 04/05/20 and cultures remain unrevealing at this time. The BAL samples have grown candida albicans and we discussed that we typically do not treat this finding as it does not usually cause pathogenic disease.   She continues to experience hoarse voice and cough along with shortness of breath. She denies weight loss, night sweats, loss of appetite.   OV 04/01/20: CT scan 03/25/2020 showed multifocal areas of nodularity and consolidative changes. Findings consistent with atypical infection or possible malignancy. She was started on azithromycin and cefpodoxime by Dr. PiDennard SchaumannShe has completed the azithromycin and she started the cefpodoxime on 2/7. She was treated with levaquin on 03/21/20 for 7 days initially until the CT scan resulted. Blood cultures were negative on 02/23/20 when she presented with AMS.   She reports she is coughing up brown sputum. At baseline she doe snot cough up sputum or it is clear. The sputum amount has declined she taking antibiotics. She denies fevers, chills or sweats. Her appetite is well.    OV 09/01/19: [name] a [age]/o Scott who presents for follow up of bronchiectasis, tracheobronchomalacia, immunosuppression, chronic AC for recurrent VTE. She started  prescribed levofloxacin last evening for Pseudomonas bronchiectasis AE. So far she has not had nausea associated with taking this med, but took zofran prophylacticaly. She remains SOB with no change so far in her symptoms. She still coughs frequently despite using her inhaler and hypertonic saline nebs + flutter as prescribed. She has  follow up with her PCP soon for hyperglycemiaa. She has follow up with her rheumatologist in August and is hoping that her prednisone will be able to be decreased soon.  Past Medical History:  Diagnosis Date   Abscess of dorsum of right hand 09/22/2020   Acute deep vein thrombosis (DVT) of right lower extremity ([name]) 12/13/2017   Allergic rhinitis    Anemia    Angio-edema    Anxiety    pt denies   Arthritis    Phreesia 10/21/2019   Asthma    Phreesia 10/21/2019   Breast cancer ([place]) 1998   in remission   Cataract    REMOVED   Clostridium difficile colitis 01/14/2018   Clostridium difficile diarrhea 35/00/9381   Complication of anesthesia    "had hard time waking up from it several times" (02/20/2012)   Depression    "some; don't take anything for it" (02/20/2012)   Diverticulosis    DVT (deep venous thrombosis) ([place])    Fibromyalgia 11/2011   GERD (gastroesophageal reflux disease)    Graves disease    Headache(784.0)    "related to allergies; more at different times during the year" (02/20/2012)   Hemorrhoids    Hiatal hernia    back and neck   Hx of adenomatous colonic polyps 04/12/2016   Hypercholesteremia    good cholesterol is high   Hypothyroidism    IBS (irritable bowel syndrome)    Moderate persistent asthma    -FeV1 72% 2011, -IgE 102 2011, CT sinus Neg 2011   Osteoporosis    on reclast yearly   Peripheral vascular disease ([place]) 2019   DVTs   Pneumonia 04/2011; ~ 11/2011   "double; single" (02/20/2012)   Septic olecranon bursitis of right elbow 09/22/2020   Seronegative rheumatoid arthritis ([place])    Dr. Lahoma Rocker   SIRS (systemic inflammatory response syndrome) ([place]) 02/10/2018   Tracheobronchomalacia      Family History  Problem Relation Age of Onset   Allergies Mother    Heart disease Mother    Arthritis Mother    Lung cancer Mother        heavy smoker   Diabetes Mother    Allergies Father    Heart disease Father    Arthritis Father    Stroke  Father    Heart disease Brother    Diabetes Maternal Grandfather    Colitis Daughter    Colon cancer Other        Maternal half uncle     Social History   Socioeconomic History   Marital status: Divorced    Spouse name: Not on file   Number of children: 2   Years of education: college   Highest education level: Not on file  Occupational History   Occupation: Disabled    Comment: Retired Engineer, production: RETIRED  Tobacco Use   Smoking status: Never    Passive exposure: Yes   Smokeless tobacco: Never   Tobacco comments:    Parents  Vaping Use   Vaping Use: Never used  Substance and Sexual Activity   Alcohol use: Not Currently    Alcohol/week: 0.0 standard drinks  Drug use: No   Sexual activity: Never  Other Topics Concern   Not on file  Social History Narrative   Patient lives at home alone. Patient  divorced.    Patient has her BS degree.   Right handed.   Caffeine- sometimes coffee.      [facility]:   Born in [place], [place]. She worked as a Copywriter, advertising. She has no pets currently. She does have indoor plants. Previously had mold in her home that was remediated. Carpet was removed.          Social Determinants of Health   Financial Resource Strain: Low Risk    Difficulty of Paying Living Expenses: Not hard at all  Food Insecurity: No Food Insecurity   Worried About Charity fundraiser in the Last Year: Never true   [place] in the Last Year: Never true  Transportation Needs: No Transportation Needs   Lack of Transportation (Medical): No   Lack of Transportation (Non-Medical): No  Physical Activity: Inactive   Days of Exercise per Week: 0 days   Minutes of Exercise per Session: 0 min  Stress: No Stress Concern Present   Feeling of Stress : Not at all  Social Connections: Moderately Isolated   Frequency of Communication with Friends and Family: More than three times a week   Frequency of Social Gatherings with  Friends and Family: More than three times a week   Attends Religious Services: Never   Marine scientist or Organizations: Yes   Attends Archivist Meetings: Never   Marital Status: Divorced  Human resources officer Violence: Not At Risk   Fear of Current or Ex-Partner: No   Emotionally Abused: No   Physically Abused: No   Sexually Abused: No     Allergies  Allergen Reactions   Baclofen Other (See Comments)    Altered mental status requiring a 3-day hospital stay- patient became unresponsive   Dust Mite Extract Shortness Of Breath and Other (See Comments)    "sneezing" (02/20/2012)   Molds & Smuts Shortness Of Breath   Morphine And Related Hives and Itching   Other Shortness Of Breath and Other (See Comments)    Grass and weeds "sneezing; filled sinuses" (02/20/2012)   Penicillins Rash and Other (See Comments)    "welts" (02/20/2012) Tolerated Ancef 02/16/20.    Rofecoxib Swelling and Other (See Comments)    Vioxx- feet swelling    Shellfish Allergy Anaphylaxis, Shortness Of Breath, Itching and Rash   Tetracycline Hcl Nausea And Vomiting   Xolair [Omalizumab] Other (See Comments)    Caused Blood clot   Zoledronic Acid Other (See Comments)    Fever, Put in hospital, dr said it was a reaction from a reaction  Other reaction(s): Unknown   Dilaudid [Hydromorphone Hcl] Itching   Levofloxacin Other (See Comments)    GI upset   Oxycodone Hcl Nausea And Vomiting   Paroxetine Nausea And Vomiting and Other (See Comments)    Paxil    Celecoxib Swelling and Other (See Comments)    Feet swelling   Diltiazem Hcl Swelling   Hydrocodone-Acetaminophen Other (See Comments)    Unknown reaction   Lactose Intolerance (Gi) Other (See Comments)    Bloating and gas   Morphine Sulfate Other (See Comments)    "Gets loopy"   Oxycodone-Acetaminophen Other (See Comments)    Unknown reaction   Rituximab     Anemia    Tree Extract Other (See Comments)    "  tested and told I was allergic  to it; never experienced a reaction to it" (02/20/2012)   Penicillin G Procaine Rash and Other (See Comments)    Welts, also     Outpatient Medications Prior to Visit  Medication Sig Dispense Refill   acetaminophen (TYLENOL) 650 MG CR tablet Take 1,300 mg by mouth every 8 (eight) hours as needed for pain.     albuterol (PROVENTIL) (2.5 MG/3ML) 0.083% nebulizer solution Take 3 mLs (2.5 mg total) by nebulization every 4 (four) hours as needed for wheezing or shortness of breath. 75 mL 12   albuterol (VENTOLIN HFA) 108 (90 Base) MCG/ACT inhaler Inhale 2 puffs into the lungs every 6 (six) hours as needed for wheezing or shortness of breath. 19 g 1   allopurinol (ZYLOPRIM) 100 MG tablet Take 1 tablet (100 mg total) by mouth daily. 90 tablet 3   Alpha-D-Galactosidase (BEANO PO) Take 2 tablets by mouth 3 (three) times daily with meals.     Alpha-Lipoic Acid 600 MG TABS Take 600 mg by mouth daily.     atorvastatin (LIPITOR) 80 MG tablet Take 1 tablet (80 mg total) by mouth daily. 90 tablet 3   azelastine (ASTELIN) 0.1 % nasal spray Use in each nostril as directed (Patient taking differently: 2 sprays daily as needed for allergies.) 30 mL 5   benzonatate (TESSALON) 200 MG capsule Take 1 capsule (200 mg total) by mouth 3 (three) times daily as needed for cough. 30 capsule 0   budesonide (PULMICORT) 0.5 MG/2ML nebulizer solution Take 2 mLs (0.5 mg total) by nebulization 2 (two) times daily as needed. 120 mL 12   CALCIUM-MAGNESIUM PO Take 1 tablet by mouth every evening.     cetirizine (ZYRTEC) 10 MG tablet Take 1 tablet (10 mg total) by mouth 2 (two) times daily as needed for allergies (Can take a n extra dose during flare ups.). 60 tablet 5   cholecalciferol (VITAMIN D) 25 MCG (1000 UNIT) tablet 1,000 Units every evening.     clopidogrel (PLAVIX) 75 MG tablet Take 1 tablet (75 mg total) by mouth daily. 90 tablet 3   dexlansoprazole (DEXILANT) 60 MG capsule Take 1 capsule (60 mg total) by mouth daily. 90  capsule 3   diclofenac Sodium (VOLTAREN) 1 % GEL Apply 1 application topically 4 (four) times daily as needed (pain).     empagliflozin (JARDIANCE) 10 MG TABS tablet Take 1 tablet (10 mg total) by mouth daily before breakfast. 30 tablet 5   EPINEPHrine 0.3 mg/0.3 mL IJ SOAJ injection USE AS DIRECTED BY YOUR PHYSICIAN INTRAMUSCULARLY AS NEEDED FOR ANAPHYLAXIS 2 each 1   famotidine (PEPCID) 20 MG tablet Take 1 tablet (20 mg total) by mouth at bedtime. 30 tablet 5   fluticasone (FLONASE) 50 MCG/ACT nasal spray USE 2 SPRAYS IN EACH NOSTRIL DAILY (Patient taking differently: 2 sprays daily as needed for allergies.) 16 g 2   folic acid (FOLVITE) 1 MG tablet Take 1 tablet (1 mg total) by mouth daily. 90 tablet 3   furosemide (LASIX) 40 MG tablet Take 1 tablet (40 mg total) by mouth daily. Take 1 table twice a day for 3 days and then back to once daily. 30 tablet 2   Glucose Blood (BLOOD GLUCOSE TEST STRIPS) STRP Please dispense per patient and insurance preference. Use as directed to monitor FSBS 4x . Dx: E11.65 150 strip 1   guaiFENesin (MUCINEX) 600 MG 12 hr tablet Take 1 tablet (600 mg total) by mouth 2 (two) times  daily as needed for cough or to loosen phlegm. (Patient taking differently: Take 600 mg by mouth 2 (two) times daily.) 60 tablet 1   HIZENTRA 2 GM/10ML SOLN Inject 1 Dose into the skin once a week.     Lactase (LACTOSE FAST ACTING RELIEF PO) Take 3 tablets by mouth in the morning, at noon, and at bedtime.     magnesium oxide (MAG-OX) 400 MG tablet Take 400 mg by mouth every evening.     montelukast (SINGULAIR) 10 MG tablet Take 1 tablet (10 mg total) by mouth daily. 30 tablet 5   Nystatin (GERHARDT'S BUTT CREAM) CREA Apply topically 3 times a day as needed for itching or irritation 4 each 6   nystatin (MYCOSTATIN) 100000 UNIT/ML suspension Use as directed 15 mLs in the mouth or throat at bedtime as needed (mouth sores).     nystatin ointment (MYCOSTATIN) Apply 1 application topically 2 (two)  times daily as needed (yeast).     pantoprazole (PROTONIX) 40 MG tablet Take 1 tablet (40 mg total) by mouth daily. 30 tablet 3   potassium chloride SA (KLOR-CON M20) 20 MEQ tablet Take 1 tablet (20 mEq total) by mouth daily. 90 tablet 3   predniSONE (DELTASONE) 1 MG tablet Take 3 mg by mouth as directed. Take along with 5 mg tablet=8 mg     Prenatal Vit-Fe Fumarate-FA (PRENATAL VITAMINS PO) Take 1 tablet by mouth daily.     PRESCRIPTION MEDICATION Place 4 drops into both ears See admin instructions. Chloramp- sulfu - ampho d-h droc 100-100-5 hydrocortisone, otic ear drops. Instill 4 drops in to each ear daily for the first 5 days of each month     Probiotic Product (ALIGN PO) Take 1 capsule by mouth daily.     Propylene Glycol (SYSTANE COMPLETE) 0.6 % SOLN Place 1 drop into both eyes 2 (two) times daily. 15 mL 5   Tiotropium Bromide-Olodaterol (STIOLTO RESPIMAT) 2.5-2.5 MCG/ACT AERS Inhale 2 puffs into the lungs daily. 4 g 5   traMADol (ULTRAM) 50 MG tablet Take 1 tablet (50 mg total) by mouth at bedtime as needed. 30 tablet 1   XARELTO 20 MG TABS tablet TAKE 1 TABLET BY MOUTH DAILY WITH SUPPER 90 tablet 3   Facility-Administered Medications Prior to Visit  Medication Dose Route Frequency Provider Last Rate Last Admin   iohexol (OMNIPAQUE) 350 MG/ML injection    PRN Leonie Man, MD   105 mL at 01/11/21 0955   [COMPLETED] iron sucrose (VENOFER) 200 mg in sodium chloride 0.9 % 100 mL IVPB  200 mg Intravenous Once Thayil, Irene T, PA-C   Stopped at 03/15/21 1420   0.9 %  sodium chloride infusion   Intravenous Once PRN Thayil, Irene T, PA-C       albuterol (VENTOLIN HFA) 108 (90 Base) MCG/ACT inhaler 2 puff  2 puff Inhalation Once PRN Thayil, Irene T, PA-C       denosumab (PROLIA) injection 60 mg  60 mg Subcutaneous Q6 months [name], MD   60 mg at 12/05/20 1201   diphenhydrAMINE (BENADRYL) injection 50 mg  50 mg Intravenous Once PRN Dede Query T, PA-C       EPINEPHrine (EPI-PEN)  injection 0.3 mg  0.3 mg Intramuscular Once PRN Dede Query T, PA-C       famotidine (PEPCID) IVPB 20 mg premix  20 mg Intravenous Once PRN Dede Query T, PA-C       methylPREDNISolone sodium succinate (SOLU-MEDROL) 125 mg/2 mL injection 125  mg  125 mg Intravenous Once PRN Dede Query T, PA-C       Review of Systems  Constitutional:  Negative for chills, fever, malaise/fatigue and weight loss.  HENT:  Positive for congestion and ear pain. Negative for sinus pain and sore throat.   Eyes: Negative.   Respiratory:  Positive for cough, sputum production, shortness of breath and wheezing. Negative for hemoptysis.   Cardiovascular:  Negative for chest pain, palpitations, orthopnea, claudication and leg swelling.  Gastrointestinal:  Negative for abdominal pain, heartburn, nausea and vomiting.  Genitourinary: Negative.   Musculoskeletal:  Negative for joint pain and myalgias.  Skin:  Negative for rash.  Neurological:  Negative for weakness.  Endo/Heme/Allergies: Negative.   Psychiatric/Behavioral: Negative.     Objective:   Vitals:   03/15/21 1539  BP: 128/70  Pulse: 72  SpO2: 98%  Weight: 127 lb (57.6 kg)  Height: _0  (1.549 m)   Physical Exam Constitutional:      General: She is not in acute distress.    Appearance: She is not ill-appearing.  HENT:     Head: Normocephalic and atraumatic.  Eyes:     General: No scleral icterus.    Conjunctiva/sclera: Conjunctivae normal.     Pupils: Pupils are equal, round, and reactive to light.  Cardiovascular:     Rate and Rhythm: Normal rate and regular rhythm.     Pulses: Normal pulses.     Heart sounds: Normal heart sounds. No murmur heard. Pulmonary:     Effort: Pulmonary effort is normal.     Breath sounds: No wheezing, rhonchi or rales.  Abdominal:     General: Bowel sounds are normal.     Palpations: Abdomen is soft.  Musculoskeletal:     Right lower leg: No edema.     Left lower leg: No edema.  Skin:    General: Skin is  warm and dry.  Neurological:     General: No focal deficit present.     Mental Status: She is alert.   CBC    Component Value Date/Time   WBC 8.6 03/24/2021 0906   WBC 17.7 (H) 02/24/2021 1022   RBC 2.99 (L) 03/24/2021 0906   HGB 8.9 (L) 03/24/2021 0906   HGB 6.4 (LL) 02/03/2021 0817   HCT 29.8 (L) 03/24/2021 0906   HCT 20.6 (L) 02/03/2021 0817   PLT 275 03/24/2021 0906   PLT 306 02/03/2021 0817   MCV 99.7 03/24/2021 0906   MCV 102 (H) 02/03/2021 0817   MCH 29.8 03/24/2021 0906   MCHC 29.9 (L) 03/24/2021 0906   RDW 22.8 (H) 03/24/2021 0906   RDW 16.3 (H) 02/03/2021 0817   LYMPHSABS 1.2 03/24/2021 0906   MONOABS 1.1 (H) 03/24/2021 0906   EOSABS 0.4 03/24/2021 0906   BASOSABS 0.1 03/24/2021 0906   BMP Latest Ref Rng & Units 03/14/2021 02/24/2021 02/07/2021  Glucose 70 - 99 mg/dL 142(H) 119(H) 104(H)  BUN 8 - 23 mg/dL 33(H) 26(H) 27(H)  Creatinine 0.44 - 1.00 mg/dL 1.37(H) 1.18(H) 1.30(H)  BUN/Creat Ratio 6 - 22 (calc) - 22 -  Sodium 135 - 145 mmol/L 141 142 139  Potassium 3.5 - 5.1 mmol/L 4.2 4.9 3.6  Chloride 98 - 111 mmol/L 103 106 102  CO2 22 - 32 mmol/L _1 Calcium 8.9 - 10.3 mg/dL 9.6 8.9 8.2(L)   Chest imaging: Chest Imaging- films reviewed: CT Chest wo contrast 08/23/20 - new trace bilateral pleural effusions and septal thickening. Decrease and resolution of  pulmonary nodules.   CT Chest wo contrast 05/25/20 - near resolution of the RUL 8m nodule. Nodular opacity of medial RUL is now 682mfrom 1310mThe opacity of the medial RLL is now 1.8x1.0cm from 2.0x1.1 and the LLL opacity is 1.9x1.6cm from 3.7x3.7cm.  CT chest wo contrast 03/25/2020 - multifocal areas of nodularity and consolidative changes. Findings consistent with atypical infection or possible malignancy.  CT high-resolution 10/24/2018-mild bronchiectasis right middle lobe, lingula, right lower lobe, but does taper more appropriately distally.  Persistent nodules in right middle lobe and lingula with mild  tree-in-bud opacities.  No inter or intralobular septal thickening or significant groundglass.  Expiratory images with some mosaicism and significant bronchial and tracheal collapse, especially on the right.    HRCT chest 08/30/19: RUL TIB opacities, multilobar bronchiectasis. Tracheobronchomalacia progressed since previous CT scan. Subacute rib fractures- healing. CAD.  PFT: PFT Results Latest Ref Rng & Units 08/20/2019 09/18/2018 06/19/2017 08/08/2015 04/29/2015  FVC-Pre L 1.72 1.74 2.00 2.08 2.24  FVC-Predicted Pre % 66 66 74 75 81  FVC-Post L 1.65 1.61 1.86 2.17 2.25  FVC-Predicted Post % 63 61 69 78 81  Pre FEV1/FVC % % 90 88 90 83 87  Post FEV1/FCV % % 90 91 93 87 84  FEV1-Pre L 1.55 1.53 1.80 1.73 1.95  FEV1-Predicted Pre % 79 76 88 82 92  FEV1-Post L 1.49 1.47 1.72 1.89 1.89  DLCO uncorrected ml/min/mmHg 11.33 13.51 13.68 12.36 12.99  DLCO UNC% % 64 77 66 60 63  DLCO corrected ml/min/mmHg 11.33 - - 13.76 12.87  DLCO COR %Predicted % 64 - - 66 62  DLVA Predicted % 83 92 85 94 81  TLC L 3.71 3.52 3.95 - 3.91  TLC % Predicted % 80 76 85 - 84  RV % Predicted % 76 79 86 - 83   2020-no significant obstruction, mild restriction, mild DLCO reduction.  Flow volume loops consistent with restriction.   2021-no obstruction or bronchodilator reversibility.  No significant restriction.  Mild diffusion impairment.  Compared to 2020, mild improvement.   Previous respiratory cultures- all fungal and AFB negative from right middle lobe and lingula during bronchoscopy 08/2016, only positive for normal flora  BAL 04/05/20 - Candida Albicans    Pathology 08/23/2016: BAL lingula-no AFB or fungus   Echocardiogram: Unable to review report from 02/2018.  08/05/2017 demonstrates LVEF 65 to 70%75%rade 1 diastolic dysfunction.  Normal LA, RV, RA.  Trivial TR and MR, otherwise normal valves.   Echocardiogram 07/10/2019-LVEF greater than 75% with hyperdynamic function, moderate LVH with grade 1 diastolic dysfunction.   Normal LA, RV, RA.  Trivial AR, otherwise normal valves.  Allergy panel 04/02/2016-increased IgE for Aspergillus, otherwise negative Alpha-1 antitrypsin 170   [facility] Lymph[facility] 1% Macrophages 79% Respiratory viral panel negative Unable to see PJP DNA Respiratory culture-1+ mixed gram-negative rods, normal flora Fungus culture negative AFB negative   08/25/19 sputum- Pan-sensitive Pseudomonas   Barium Swallow 02/15/2015  1. Small type 1 transient hiatal hernia. 2. Incomplete epiglottic inversion during swallowing, with laryngeal penetration of thick liquid to the level of the vocal cords. 3. No stricture or dysmotility identified. 4. 4 mm anterolisthesis at C4-5.  Assessment & Plan:   Bronchiectasis without complication (HCCTalihinaTracheobronchomalacia  Multiple lung nodules on CT  Chronic cough  Discussion: Ebony Scott, never smoker with asthma, tracheobronchomalacia on CPAP with oxygen, bronchiectasis and rheumatoid arthritis on prednisone (previously on  leflunamide and rituximab) who returns to pulmonary clinic for follow up.   She remained stable since last visit in 10/2020.  She continues to remain symptomatic with cough, sputum production and wheezing.  This is due in combination to her swallowing issues with incomplete epiglottic inversion leading to laryngeal penetration of liquid to the vocal cords.  She also has bronchiectasis and tracheobronchomalacia.  She is to continue Stiolto inhaler 2 puffs daily and flutter valve therapy.  The nodules and opacities on her CT scans have significantly improved/resolved from 03/2020 to the recent scan on 08/2020 likely representing an infectious or inflammatory etiology. No further follow up of the pulmonary nodules at this time.   Follow up in 6 months.   Freda Jackson, MD [facility] Office: [phone]   Current  Outpatient Medications:    acetaminophen (TYLENOL) 650 MG CR tablet, Take 1,300 mg by mouth every 8 (eight) hours as needed for pain., Disp: , Rfl:    albuterol (PROVENTIL) (2.5 MG/3ML) 0.083% nebulizer solution, Take 3 mLs (2.5 mg total) by nebulization every 4 (four) hours as needed for wheezing or shortness of breath., Disp: 75 mL, Rfl: 12   albuterol (VENTOLIN HFA) 108 (90 Base) MCG/ACT inhaler, Inhale 2 puffs into the lungs every 6 (six) hours as needed for wheezing or shortness of breath., Disp: 19 g, Rfl: 1   allopurinol (ZYLOPRIM) 100 MG tablet, Take 1 tablet (100 mg total) by mouth daily., Disp: 90 tablet, Rfl: 3   Alpha-D-Galactosidase (BEANO PO), Take 2 tablets by mouth 3 (three) times daily with meals., Disp: , Rfl:    Alpha-Lipoic Acid 600 MG TABS, Take 600 mg by mouth daily., Disp: , Rfl:    atorvastatin (LIPITOR) 80 MG tablet, Take 1 tablet (80 mg total) by mouth daily., Disp: 90 tablet, Rfl: 3   azelastine (ASTELIN) 0.1 % nasal spray, Use in each nostril as directed (Patient taking differently: 2 sprays daily as needed for allergies.), Disp: 30 mL, Rfl: 5   benzonatate (TESSALON) 200 MG capsule, Take 1 capsule (200 mg total) by mouth 3 (three) times daily as needed for cough., Disp: 30 capsule, Rfl: 0   budesonide (PULMICORT) 0.5 MG/2ML nebulizer solution, Take 2 mLs (0.5 mg total) by nebulization 2 (two) times daily as needed., Disp: 120 mL, Rfl: 12   CALCIUM-MAGNESIUM PO, Take 1 tablet by mouth every evening., Disp: , Rfl:    cetirizine (ZYRTEC) 10 MG tablet, Take 1 tablet (10 mg total) by mouth 2 (two) times daily as needed for allergies (Can take a n extra dose during flare ups.)., Disp: 60 tablet, Rfl: 5   cholecalciferol (VITAMIN D) 25 MCG (1000 UNIT) tablet, 1,000 Units every evening., Disp: , Rfl:    clopidogrel (PLAVIX) 75 MG tablet, Take 1 tablet (75 mg total) by mouth daily., Disp: 90 tablet, Rfl: 3   dexlansoprazole (DEXILANT) 60 MG capsule, Take 1 capsule (60 mg total) by  mouth daily., Disp: 90 capsule, Rfl: 3   diclofenac Sodium (VOLTAREN) 1 % GEL, Apply 1 application topically 4 (four) times daily as needed (pain)., Disp: , Rfl:    empagliflozin (JARDIANCE) 10 MG TABS tablet, Take 1 tablet (10 mg total) by mouth daily before breakfast., Disp: 30 tablet, Rfl: 5   EPINEPHrine 0.3 mg/0.3 mL IJ SOAJ injection, USE AS DIRECTED BY YOUR PHYSICIAN INTRAMUSCULARLY AS NEEDED FOR ANAPHYLAXIS, Disp: 2 each, Rfl: 1   famotidine (PEPCID) 20 MG tablet, Take 1 tablet (20 mg total) by mouth at bedtime., Disp:  30 tablet, Rfl: 5   fluticasone (FLONASE) 50 MCG/ACT nasal spray, USE 2 SPRAYS IN EACH NOSTRIL DAILY (Patient taking differently: 2 sprays daily as needed for allergies.), Disp: 16 g, Rfl: 2   folic acid (FOLVITE) 1 MG tablet, Take 1 tablet (1 mg total) by mouth daily., Disp: 90 tablet, Rfl: 3   furosemide (LASIX) 40 MG tablet, Take 1 tablet (40 mg total) by mouth daily. Take 1 table twice a day for 3 days and then back to once daily., Disp: 30 tablet, Rfl: 2   Glucose Blood (BLOOD GLUCOSE TEST STRIPS) STRP, Please dispense per patient and insurance preference. Use as directed to monitor FSBS 4x . Dx: E11.65, Disp: 150 strip, Rfl: 1   guaiFENesin (MUCINEX) 600 MG 12 hr tablet, Take 1 tablet (600 mg total) by mouth 2 (two) times daily as needed for cough or to loosen phlegm. (Patient taking differently: Take 600 mg by mouth 2 (two) times daily.), Disp: 60 tablet, Rfl: 1   HIZENTRA 2 GM/10ML SOLN, Inject 1 Dose into the skin once a week., Disp: , Rfl:    Lactase (LACTOSE FAST ACTING RELIEF PO), Take 3 tablets by mouth in the morning, at noon, and at bedtime., Disp: , Rfl:    magnesium oxide (MAG-OX) 400 MG tablet, Take 400 mg by mouth every evening., Disp: , Rfl:    montelukast (SINGULAIR) 10 MG tablet, Take 1 tablet (10 mg total) by mouth daily., Disp: 30 tablet, Rfl: 5   Nystatin (GERHARDT'S BUTT CREAM) CREA, Apply topically 3 times a day as needed for itching or irritation,  Disp: 4 each, Rfl: 6   nystatin (MYCOSTATIN) 100000 UNIT/ML suspension, Use as directed 15 mLs in the mouth or throat at bedtime as needed (mouth sores)., Disp: , Rfl:    nystatin ointment (MYCOSTATIN), Apply 1 application topically 2 (two) times daily as needed (yeast)., Disp: , Rfl:    pantoprazole (PROTONIX) 40 MG tablet, Take 1 tablet (40 mg total) by mouth daily., Disp: 30 tablet, Rfl: 3   potassium chloride SA (KLOR-CON M20) 20 MEQ tablet, Take 1 tablet (20 mEq total) by mouth daily., Disp: 90 tablet, Rfl: 3   predniSONE (DELTASONE) 1 MG tablet, Take 3 mg by mouth as directed. Take along with 5 mg tablet=8 mg, Disp: , Rfl:    Prenatal Vit-Fe Fumarate-FA (PRENATAL VITAMINS PO), Take 1 tablet by mouth daily., Disp: , Rfl:    PRESCRIPTION MEDICATION, Place 4 drops into both ears See admin instructions. Chloramp- sulfu - ampho d-h droc 100-100-5 hydrocortisone, otic ear drops. Instill 4 drops in to each ear daily for the first 5 days of each month, Disp: , Rfl:    Probiotic Product (ALIGN PO), Take 1 capsule by mouth daily., Disp: , Rfl:    Propylene Glycol (SYSTANE COMPLETE) 0.6 % SOLN, Place 1 drop into both eyes 2 (two) times daily., Disp: 15 mL, Rfl: 5   Tiotropium Bromide-Olodaterol (STIOLTO RESPIMAT) 2.5-2.5 MCG/ACT AERS, Inhale 2 puffs into the lungs daily., Disp: 4 g, Rfl: 5   traMADol (ULTRAM) 50 MG tablet, Take 1 tablet (50 mg total) by mouth at bedtime as needed., Disp: 30 tablet, Rfl: 1   XARELTO 20 MG TABS tablet, TAKE 1 TABLET BY MOUTH DAILY WITH SUPPER, Disp: 90 tablet, Rfl: 3 No current facility-administered medications for this visit.  Facility-Administered Medications Ordered in Other Visits:    iohexol (OMNIPAQUE) 350 MG/ML injection, , , PRN, Leonie Man, MD, 105 mL at 01/11/21 [phone]

## 2021-03-15 NOTE — Progress Notes (Signed)
Diagnosis: Iron Deficiency Anemia  Provider:  Marshell Garfinkel, MD  Procedure: Infusion  IV Type: Peripheral, IV Location: R Antecubital  Venofer (Iron Sucrose), Dose: 200 mg  Infusion Start Time: 0233  Infusion Stop Time: 4356  Post Infusion IV Care: Observation period completed and Peripheral IV Discontinued  Discharge: Condition: Good, Destination: Home . AVS provided to patient.   Performed by:  Koren Shiver, RN

## 2021-03-16 DIAGNOSIS — F32A Depression, unspecified: Secondary | ICD-10-CM | POA: Diagnosis not present

## 2021-03-16 DIAGNOSIS — D62 Acute posthemorrhagic anemia: Secondary | ICD-10-CM | POA: Diagnosis not present

## 2021-03-16 DIAGNOSIS — I5042 Chronic combined systolic (congestive) and diastolic (congestive) heart failure: Secondary | ICD-10-CM | POA: Diagnosis not present

## 2021-03-16 DIAGNOSIS — E1122 Type 2 diabetes mellitus with diabetic chronic kidney disease: Secondary | ICD-10-CM | POA: Diagnosis not present

## 2021-03-16 DIAGNOSIS — D631 Anemia in chronic kidney disease: Secondary | ICD-10-CM | POA: Diagnosis not present

## 2021-03-16 DIAGNOSIS — M71121 Other infective bursitis, right elbow: Secondary | ICD-10-CM | POA: Diagnosis not present

## 2021-03-16 DIAGNOSIS — E1151 Type 2 diabetes mellitus with diabetic peripheral angiopathy without gangrene: Secondary | ICD-10-CM | POA: Diagnosis not present

## 2021-03-16 DIAGNOSIS — K219 Gastro-esophageal reflux disease without esophagitis: Secondary | ICD-10-CM | POA: Diagnosis not present

## 2021-03-16 DIAGNOSIS — N1832 Chronic kidney disease, stage 3b: Secondary | ICD-10-CM | POA: Diagnosis not present

## 2021-03-17 ENCOUNTER — Other Ambulatory Visit: Payer: Self-pay | Admitting: Pharmacy Technician

## 2021-03-17 ENCOUNTER — Ambulatory Visit (INDEPENDENT_AMBULATORY_CARE_PROVIDER_SITE_OTHER): Payer: Medicare PPO

## 2021-03-17 ENCOUNTER — Other Ambulatory Visit: Payer: Self-pay

## 2021-03-17 VITALS — BP 116/68 | HR 72 | Temp 97.6°F | Resp 16 | Ht 61.0 in | Wt 124.0 lb

## 2021-03-17 DIAGNOSIS — D509 Iron deficiency anemia, unspecified: Secondary | ICD-10-CM | POA: Diagnosis not present

## 2021-03-17 MED ORDER — FAMOTIDINE IN NACL 20-0.9 MG/50ML-% IV SOLN: 20.0000 mg | Freq: Once | INTRAVENOUS | Status: DC | PRN

## 2021-03-17 MED ORDER — DIPHENHYDRAMINE HCL 50 MG/ML IJ SOLN: 50.0000 mg | Freq: Once | INTRAMUSCULAR | Status: DC | PRN

## 2021-03-17 MED ORDER — ALBUTEROL SULFATE HFA 108 (90 BASE) MCG/ACT IN AERS: 2.0000 | INHALATION_SPRAY | Freq: Once | RESPIRATORY_TRACT | Status: DC | PRN

## 2021-03-17 MED ORDER — SODIUM CHLORIDE 0.9 % IV SOLN
200.0000 mg | Freq: Once | INTRAVENOUS | Status: AC
  Administered 2021-03-17: 200 mg via INTRAVENOUS
  Filled 2021-03-17: qty 10

## 2021-03-17 MED ORDER — METHYLPREDNISOLONE SODIUM SUCC 125 MG IJ SOLR: 125.0000 mg | Freq: Once | INTRAMUSCULAR | Status: DC | PRN

## 2021-03-17 MED ORDER — SODIUM CHLORIDE 0.9 % IV SOLN: Freq: Once | INTRAVENOUS | Status: DC | PRN

## 2021-03-17 MED ORDER — EPINEPHRINE 0.3 MG/0.3ML IJ SOAJ: 0.3000 mg | Freq: Once | INTRAMUSCULAR | Status: DC | PRN

## 2021-03-17 NOTE — Progress Notes (Signed)
Diagnosis: Iron Deficiency Anemia  Provider:  Marshell Garfinkel, MD  Procedure: Infusion  IV Type: Peripheral, IV Location: R Antecubital  Venofer (Iron Sucrose), Dose: 200 mg  Infusion Start Time: 10.25  Infusion Stop Time: 11.00  Post Infusion IV Care: Peripheral IV Discontinued  Discharge: Condition: Good, Destination: Home . AVS provided to patient.   Performed by:  Arnoldo Morale, RN

## 2021-03-19 ENCOUNTER — Encounter: Payer: Self-pay | Admitting: Physician Assistant

## 2021-03-20 ENCOUNTER — Ambulatory Visit: Payer: Medicare PPO

## 2021-03-20 DIAGNOSIS — M71121 Other infective bursitis, right elbow: Secondary | ICD-10-CM | POA: Diagnosis not present

## 2021-03-20 DIAGNOSIS — E1151 Type 2 diabetes mellitus with diabetic peripheral angiopathy without gangrene: Secondary | ICD-10-CM | POA: Diagnosis not present

## 2021-03-20 DIAGNOSIS — F32A Depression, unspecified: Secondary | ICD-10-CM | POA: Diagnosis not present

## 2021-03-20 DIAGNOSIS — D62 Acute posthemorrhagic anemia: Secondary | ICD-10-CM | POA: Diagnosis not present

## 2021-03-20 DIAGNOSIS — E1122 Type 2 diabetes mellitus with diabetic chronic kidney disease: Secondary | ICD-10-CM | POA: Diagnosis not present

## 2021-03-20 DIAGNOSIS — D631 Anemia in chronic kidney disease: Secondary | ICD-10-CM | POA: Diagnosis not present

## 2021-03-20 DIAGNOSIS — I5042 Chronic combined systolic (congestive) and diastolic (congestive) heart failure: Secondary | ICD-10-CM | POA: Diagnosis not present

## 2021-03-20 DIAGNOSIS — K219 Gastro-esophageal reflux disease without esophagitis: Secondary | ICD-10-CM | POA: Diagnosis not present

## 2021-03-20 DIAGNOSIS — N1832 Chronic kidney disease, stage 3b: Secondary | ICD-10-CM | POA: Diagnosis not present

## 2021-03-21 DIAGNOSIS — D62 Acute posthemorrhagic anemia: Secondary | ICD-10-CM | POA: Diagnosis not present

## 2021-03-21 DIAGNOSIS — I5042 Chronic combined systolic (congestive) and diastolic (congestive) heart failure: Secondary | ICD-10-CM | POA: Diagnosis not present

## 2021-03-21 DIAGNOSIS — F32A Depression, unspecified: Secondary | ICD-10-CM | POA: Diagnosis not present

## 2021-03-21 DIAGNOSIS — M71121 Other infective bursitis, right elbow: Secondary | ICD-10-CM | POA: Diagnosis not present

## 2021-03-21 DIAGNOSIS — E1122 Type 2 diabetes mellitus with diabetic chronic kidney disease: Secondary | ICD-10-CM | POA: Diagnosis not present

## 2021-03-21 DIAGNOSIS — D631 Anemia in chronic kidney disease: Secondary | ICD-10-CM | POA: Diagnosis not present

## 2021-03-21 DIAGNOSIS — E1151 Type 2 diabetes mellitus with diabetic peripheral angiopathy without gangrene: Secondary | ICD-10-CM | POA: Diagnosis not present

## 2021-03-21 DIAGNOSIS — N1832 Chronic kidney disease, stage 3b: Secondary | ICD-10-CM | POA: Diagnosis not present

## 2021-03-21 DIAGNOSIS — K219 Gastro-esophageal reflux disease without esophagitis: Secondary | ICD-10-CM | POA: Diagnosis not present

## 2021-03-22 ENCOUNTER — Ambulatory Visit: Payer: Medicare PPO

## 2021-03-23 ENCOUNTER — Other Ambulatory Visit: Payer: Self-pay | Admitting: Physician Assistant

## 2021-03-23 ENCOUNTER — Telehealth: Payer: Self-pay | Admitting: Allergy & Immunology

## 2021-03-23 DIAGNOSIS — D509 Iron deficiency anemia, unspecified: Secondary | ICD-10-CM

## 2021-03-23 NOTE — Telephone Encounter (Signed)
Her home nurse called with concerns about giving any Hizentra today.  Ashleigh asked me to talk to her.  The nurse, who does the infusions of Hizentra, reports that she is very tired today.  Hemoglobin is 8.5 which seems to be her baseline for the past month or 2.  She is getting infusions of iron weekly for 4 doses to try to raise her hemoglobin.  She is getting her second iron infusion tomorrow.    The nurse is concerned that her Hizentra will cause worsening anemia.  She tells me that this is a known side effect of Hizentra.  I am not sure how that works unless is just to volume issue where she gets extra liquid in her body from the Hizentra which dilutes her blood even more.  Regardless, the nurse tells that she does not want to get the Hizentra today.  She also tells me that Lorean is very weak and lethargic, although I do hear her talking to the nurse in the background.    I told her it was fine to hold the Hizentra for a week and we can reevaluate next week.  I am also going to route the note to Dr. Neldon Mc to keep him in the loop.    Salvatore Marvel, MD [facility] [place] [facility] [place]

## 2021-03-24 ENCOUNTER — Other Ambulatory Visit: Payer: Self-pay

## 2021-03-24 ENCOUNTER — Ambulatory Visit (INDEPENDENT_AMBULATORY_CARE_PROVIDER_SITE_OTHER): Payer: Medicare PPO

## 2021-03-24 ENCOUNTER — Inpatient Hospital Stay: Payer: Medicare PPO | Attending: Physician Assistant

## 2021-03-24 ENCOUNTER — Telehealth: Payer: Self-pay

## 2021-03-24 VITALS — BP 119/64 | HR 74 | Temp 98.0°F | Resp 18 | Ht 61.0 in | Wt 125.0 lb

## 2021-03-24 DIAGNOSIS — D509 Iron deficiency anemia, unspecified: Secondary | ICD-10-CM | POA: Diagnosis not present

## 2021-03-24 DIAGNOSIS — D500 Iron deficiency anemia secondary to blood loss (chronic): Secondary | ICD-10-CM | POA: Insufficient documentation

## 2021-03-24 LAB — CBC WITH DIFFERENTIAL (CANCER CENTER ONLY)
Abs Immature Granulocytes: 0.03 10*3/uL (ref 0.00–0.07)
Basophils Absolute: 0.1 10*3/uL (ref 0.0–0.1)
Basophils Relative: 1 %
Eosinophils Absolute: 0.4 10*3/uL (ref 0.0–0.5)
Eosinophils Relative: 5 %
HCT: 29.8 % — ABNORMAL LOW (ref 36.0–46.0)
Hemoglobin: 8.9 g/dL — ABNORMAL LOW (ref 12.0–15.0)
Immature Granulocytes: 0 %
Lymphocytes Relative: 14 %
Lymphs Abs: 1.2 10*3/uL (ref 0.7–4.0)
MCH: 29.8 pg (ref 26.0–34.0)
MCHC: 29.9 g/dL — ABNORMAL LOW (ref 30.0–36.0)
MCV: 99.7 fL (ref 80.0–100.0)
Monocytes Absolute: 1.1 10*3/uL — ABNORMAL HIGH (ref 0.1–1.0)
Monocytes Relative: 13 %
Neutro Abs: 5.8 10*3/uL (ref 1.7–7.7)
Neutrophils Relative %: 67 %
Platelet Count: 275 10*3/uL (ref 150–400)
RBC: 2.99 MIL/uL — ABNORMAL LOW (ref 3.87–5.11)
RDW: 22.8 % — ABNORMAL HIGH (ref 11.5–15.5)
WBC Count: 8.6 10*3/uL (ref 4.0–10.5)
nRBC: 0.2 % (ref 0.0–0.2)

## 2021-03-24 MED ORDER — METHYLPREDNISOLONE SODIUM SUCC 125 MG IJ SOLR: 125.0000 mg | Freq: Once | INTRAMUSCULAR | Status: DC | PRN

## 2021-03-24 MED ORDER — ALBUTEROL SULFATE HFA 108 (90 BASE) MCG/ACT IN AERS: 2.0000 | INHALATION_SPRAY | Freq: Once | RESPIRATORY_TRACT | Status: DC | PRN

## 2021-03-24 MED ORDER — SODIUM CHLORIDE 0.9 % IV SOLN: Freq: Once | INTRAVENOUS | Status: DC | PRN

## 2021-03-24 MED ORDER — FAMOTIDINE IN NACL 20-0.9 MG/50ML-% IV SOLN: 20.0000 mg | Freq: Once | INTRAVENOUS | Status: DC | PRN

## 2021-03-24 MED ORDER — DIPHENHYDRAMINE HCL 50 MG/ML IJ SOLN: 50.0000 mg | Freq: Once | INTRAMUSCULAR | Status: DC | PRN

## 2021-03-24 MED ORDER — EPINEPHRINE 0.3 MG/0.3ML IJ SOAJ: 0.3000 mg | Freq: Once | INTRAMUSCULAR | Status: DC | PRN

## 2021-03-24 MED ORDER — SODIUM CHLORIDE 0.9 % IV SOLN
200.0000 mg | Freq: Once | INTRAVENOUS | Status: AC
  Administered 2021-03-24: 200 mg via INTRAVENOUS
  Filled 2021-03-24: qty 10

## 2021-03-24 NOTE — Progress Notes (Signed)
Diagnosis: Iron Deficiency Anemia  Provider:  Marshell Garfinkel, MD  Procedure: Infusion  IV Type: Peripheral, IV Location: R Forearm  Venofer (Iron Sucrose), Dose: 200 mg  Infusion Start Time: 09.47   Infusion Stop Time: 1005  Post Infusion IV Care: Peripheral IV Discontinued  Discharge: Condition: Good, Destination: Home . AVS provided to patient.   Performed by:  Cleophus Molt, RN

## 2021-03-24 NOTE — Telephone Encounter (Signed)
-----   Message from Lincoln Brigham, [place] sent at 03/24/2021 12:23 PM EST ----- Please notify patient that hemoglobin levels have improved some so recommend to complete IV iron infusions. We will see patient back after completion of IV iron infusions to determine if additional improvement is seen on labs. No indication for blood transfusion.    ----- Message ----- From: Interface, Lab In [place] Sent: 03/24/2021   9:26 AM EST To: Lincoln Brigham, PA-C

## 2021-03-24 NOTE — Telephone Encounter (Signed)
Pt advised with verbal understanding  °

## 2021-03-26 ENCOUNTER — Encounter: Payer: Self-pay | Admitting: Pulmonary Disease

## 2021-03-28 DIAGNOSIS — N1832 Chronic kidney disease, stage 3b: Secondary | ICD-10-CM | POA: Diagnosis not present

## 2021-03-28 DIAGNOSIS — D62 Acute posthemorrhagic anemia: Secondary | ICD-10-CM | POA: Diagnosis not present

## 2021-03-28 DIAGNOSIS — F32A Depression, unspecified: Secondary | ICD-10-CM | POA: Diagnosis not present

## 2021-03-28 DIAGNOSIS — M71121 Other infective bursitis, right elbow: Secondary | ICD-10-CM | POA: Diagnosis not present

## 2021-03-28 DIAGNOSIS — K219 Gastro-esophageal reflux disease without esophagitis: Secondary | ICD-10-CM | POA: Diagnosis not present

## 2021-03-28 DIAGNOSIS — E1122 Type 2 diabetes mellitus with diabetic chronic kidney disease: Secondary | ICD-10-CM | POA: Diagnosis not present

## 2021-03-28 DIAGNOSIS — D631 Anemia in chronic kidney disease: Secondary | ICD-10-CM | POA: Diagnosis not present

## 2021-03-28 DIAGNOSIS — E1151 Type 2 diabetes mellitus with diabetic peripheral angiopathy without gangrene: Secondary | ICD-10-CM | POA: Diagnosis not present

## 2021-03-28 DIAGNOSIS — I5042 Chronic combined systolic (congestive) and diastolic (congestive) heart failure: Secondary | ICD-10-CM | POA: Diagnosis not present

## 2021-03-29 DIAGNOSIS — M25512 Pain in left shoulder: Secondary | ICD-10-CM | POA: Diagnosis not present

## 2021-03-29 DIAGNOSIS — D62 Acute posthemorrhagic anemia: Secondary | ICD-10-CM | POA: Diagnosis not present

## 2021-03-29 DIAGNOSIS — E1151 Type 2 diabetes mellitus with diabetic peripheral angiopathy without gangrene: Secondary | ICD-10-CM | POA: Diagnosis not present

## 2021-03-29 DIAGNOSIS — F32A Depression, unspecified: Secondary | ICD-10-CM | POA: Diagnosis not present

## 2021-03-29 DIAGNOSIS — K219 Gastro-esophageal reflux disease without esophagitis: Secondary | ICD-10-CM | POA: Diagnosis not present

## 2021-03-29 DIAGNOSIS — E1122 Type 2 diabetes mellitus with diabetic chronic kidney disease: Secondary | ICD-10-CM | POA: Diagnosis not present

## 2021-03-29 DIAGNOSIS — D631 Anemia in chronic kidney disease: Secondary | ICD-10-CM | POA: Diagnosis not present

## 2021-03-29 DIAGNOSIS — N1832 Chronic kidney disease, stage 3b: Secondary | ICD-10-CM | POA: Diagnosis not present

## 2021-03-29 DIAGNOSIS — M71121 Other infective bursitis, right elbow: Secondary | ICD-10-CM | POA: Diagnosis not present

## 2021-03-29 DIAGNOSIS — M1712 Unilateral primary osteoarthritis, left knee: Secondary | ICD-10-CM | POA: Diagnosis not present

## 2021-03-29 DIAGNOSIS — I5042 Chronic combined systolic (congestive) and diastolic (congestive) heart failure: Secondary | ICD-10-CM | POA: Diagnosis not present

## 2021-03-31 ENCOUNTER — Ambulatory Visit (INDEPENDENT_AMBULATORY_CARE_PROVIDER_SITE_OTHER): Payer: Medicare PPO

## 2021-03-31 ENCOUNTER — Ambulatory Visit: Payer: Medicare PPO

## 2021-03-31 ENCOUNTER — Other Ambulatory Visit: Payer: Self-pay

## 2021-03-31 VITALS — BP 144/68 | HR 70 | Temp 98.0°F | Resp 18 | Ht 61.0 in | Wt 125.4 lb

## 2021-03-31 DIAGNOSIS — D509 Iron deficiency anemia, unspecified: Secondary | ICD-10-CM

## 2021-03-31 MED ORDER — ALBUTEROL SULFATE HFA 108 (90 BASE) MCG/ACT IN AERS: 2.0000 | INHALATION_SPRAY | Freq: Once | RESPIRATORY_TRACT | Status: DC | PRN

## 2021-03-31 MED ORDER — METHYLPREDNISOLONE SODIUM SUCC 125 MG IJ SOLR: 125.0000 mg | Freq: Once | INTRAMUSCULAR | Status: DC | PRN

## 2021-03-31 MED ORDER — SODIUM CHLORIDE 0.9 % IV SOLN
200.0000 mg | Freq: Once | INTRAVENOUS | Status: AC
  Administered 2021-03-31: 200 mg via INTRAVENOUS
  Filled 2021-03-31: qty 10

## 2021-03-31 MED ORDER — EPINEPHRINE 0.3 MG/0.3ML IJ SOAJ: 0.3000 mg | Freq: Once | INTRAMUSCULAR | Status: DC | PRN

## 2021-03-31 MED ORDER — DIPHENHYDRAMINE HCL 50 MG/ML IJ SOLN: 50.0000 mg | Freq: Once | INTRAMUSCULAR | Status: DC | PRN

## 2021-03-31 MED ORDER — SODIUM CHLORIDE 0.9 % IV SOLN: Freq: Once | INTRAVENOUS | Status: DC | PRN

## 2021-03-31 MED ORDER — FAMOTIDINE IN NACL 20-0.9 MG/50ML-% IV SOLN: 20.0000 mg | Freq: Once | INTRAVENOUS | Status: DC | PRN

## 2021-03-31 NOTE — Progress Notes (Signed)
Diagnosis: Iron Deficiency Anemia  Provider:  Marshell Garfinkel, MD  Procedure: Infusion  IV Type: Peripheral, IV Location: R Antecubital  Venofer (Iron Sucrose), Dose: 200 mg  Infusion Start Time: 1024  Infusion Stop Time: 3643  Post Infusion IV Care: Peripheral IV Discontinued  Discharge: Condition: Good, Destination: Home . AVS provided to patient.   Performed by:  Cleophus Molt, RN

## 2021-04-03 DIAGNOSIS — I5042 Chronic combined systolic (congestive) and diastolic (congestive) heart failure: Secondary | ICD-10-CM | POA: Diagnosis not present

## 2021-04-03 DIAGNOSIS — F32A Depression, unspecified: Secondary | ICD-10-CM | POA: Diagnosis not present

## 2021-04-03 DIAGNOSIS — M71121 Other infective bursitis, right elbow: Secondary | ICD-10-CM | POA: Diagnosis not present

## 2021-04-03 DIAGNOSIS — E1122 Type 2 diabetes mellitus with diabetic chronic kidney disease: Secondary | ICD-10-CM | POA: Diagnosis not present

## 2021-04-03 DIAGNOSIS — D631 Anemia in chronic kidney disease: Secondary | ICD-10-CM | POA: Diagnosis not present

## 2021-04-03 DIAGNOSIS — E1151 Type 2 diabetes mellitus with diabetic peripheral angiopathy without gangrene: Secondary | ICD-10-CM | POA: Diagnosis not present

## 2021-04-03 DIAGNOSIS — N1832 Chronic kidney disease, stage 3b: Secondary | ICD-10-CM | POA: Diagnosis not present

## 2021-04-03 DIAGNOSIS — D62 Acute posthemorrhagic anemia: Secondary | ICD-10-CM | POA: Diagnosis not present

## 2021-04-03 DIAGNOSIS — K219 Gastro-esophageal reflux disease without esophagitis: Secondary | ICD-10-CM | POA: Diagnosis not present

## 2021-04-04 DIAGNOSIS — F32A Depression, unspecified: Secondary | ICD-10-CM | POA: Diagnosis not present

## 2021-04-04 DIAGNOSIS — M71121 Other infective bursitis, right elbow: Secondary | ICD-10-CM | POA: Diagnosis not present

## 2021-04-04 DIAGNOSIS — I5042 Chronic combined systolic (congestive) and diastolic (congestive) heart failure: Secondary | ICD-10-CM | POA: Diagnosis not present

## 2021-04-04 DIAGNOSIS — D62 Acute posthemorrhagic anemia: Secondary | ICD-10-CM | POA: Diagnosis not present

## 2021-04-04 DIAGNOSIS — E1122 Type 2 diabetes mellitus with diabetic chronic kidney disease: Secondary | ICD-10-CM | POA: Diagnosis not present

## 2021-04-04 DIAGNOSIS — D631 Anemia in chronic kidney disease: Secondary | ICD-10-CM | POA: Diagnosis not present

## 2021-04-04 DIAGNOSIS — N1832 Chronic kidney disease, stage 3b: Secondary | ICD-10-CM | POA: Diagnosis not present

## 2021-04-04 DIAGNOSIS — E1151 Type 2 diabetes mellitus with diabetic peripheral angiopathy without gangrene: Secondary | ICD-10-CM | POA: Diagnosis not present

## 2021-04-04 DIAGNOSIS — K219 Gastro-esophageal reflux disease without esophagitis: Secondary | ICD-10-CM | POA: Diagnosis not present

## 2021-04-05 ENCOUNTER — Other Ambulatory Visit: Payer: Self-pay

## 2021-04-05 ENCOUNTER — Ambulatory Visit (INDEPENDENT_AMBULATORY_CARE_PROVIDER_SITE_OTHER): Payer: Medicare PPO

## 2021-04-05 ENCOUNTER — Ambulatory Visit: Payer: Medicare PPO | Admitting: Podiatry

## 2021-04-05 DIAGNOSIS — Z01818 Encounter for other preprocedural examination: Secondary | ICD-10-CM

## 2021-04-05 DIAGNOSIS — M2041 Other hammer toe(s) (acquired), right foot: Secondary | ICD-10-CM | POA: Diagnosis not present

## 2021-04-05 DIAGNOSIS — M778 Other enthesopathies, not elsewhere classified: Secondary | ICD-10-CM

## 2021-04-05 DIAGNOSIS — M7989 Other specified soft tissue disorders: Secondary | ICD-10-CM | POA: Diagnosis not present

## 2021-04-05 DIAGNOSIS — M79672 Pain in left foot: Secondary | ICD-10-CM

## 2021-04-05 DIAGNOSIS — M24574 Contracture, right foot: Secondary | ICD-10-CM

## 2021-04-06 ENCOUNTER — Telehealth: Payer: Self-pay

## 2021-04-06 NOTE — Telephone Encounter (Signed)
Pt mailed in a handicap placard form needing to be completed by pcp. Form has checked attached for DMV. After form completed pt would please like for form to be mailed to [facility]. Form and check along with addressed envelope placed in nurse's folder for completion.   Cb#: [phone]

## 2021-04-06 NOTE — Telephone Encounter (Signed)
Form received and placed in provider's folder for signature. 

## 2021-04-07 ENCOUNTER — Ambulatory Visit: Payer: Medicare PPO

## 2021-04-08 ENCOUNTER — Other Ambulatory Visit: Payer: Self-pay | Admitting: Family Medicine

## 2021-04-11 ENCOUNTER — Other Ambulatory Visit: Payer: Self-pay

## 2021-04-11 NOTE — Progress Notes (Signed)
Subjective:  Patient ID: Ebony Scott, female    DOB: [birth year],  MRN: [id number]  Chief Complaint  Patient presents with   Nail Problem    71 y.o. female presents with the above complaint.  Patient presents with new complaint of right second digit hammertoe contracture and tightness of the metatarsophalangeal joint contracture of the second.  Patient also has a left soft tissue mass.  Patient states is painful to walk on painful to touch to both feet.  Patient would like to have it corrected.  She has not seen anyone else prior to seeing me.  She is not a diabetic.  She has tried all conservative treatment options including padding protecting none of which has helped.  She would like to discuss surgical options at this time.   Review of Systems: Negative except as noted in the HPI. Denies N/V/F/Ch.  Past Medical History:  Diagnosis Date   Abscess of dorsum of right hand 09/22/2020   Acute deep vein thrombosis (DVT) of right lower extremity ([place]) 12/13/2017   Allergic rhinitis    Anemia    Angio-edema    Anxiety    pt denies   Arthritis    Phreesia 10/21/2019   Asthma    Phreesia 10/21/2019   Breast cancer ([place]) 1998   in remission   Cataract    REMOVED   Clostridium difficile colitis 01/14/2018   Clostridium difficile diarrhea 03/00/9233   Complication of anesthesia    "had hard time waking up from it several times" (02/20/2012)   Depression    "some; don't take anything for it" (02/20/2012)   Diverticulosis    DVT (deep venous thrombosis) ([place])    Fibromyalgia 11/2011   GERD (gastroesophageal reflux disease)    Graves disease    Headache(784.0)    "related to allergies; more at different times during the year" (02/20/2012)   Hemorrhoids    Hiatal hernia    back and neck   Hx of adenomatous colonic polyps 04/12/2016   Hypercholesteremia    good cholesterol is high   Hypothyroidism    IBS (irritable bowel syndrome)    Moderate persistent asthma    -FeV1 72% 2011, -IgE  102 2011, CT sinus Neg 2011   Osteoporosis    on reclast yearly   Peripheral vascular disease ([place]) 2019   DVTs   Pneumonia 04/2011; ~ 11/2011   "double; single" (02/20/2012)   Septic olecranon bursitis of right elbow 09/22/2020   Seronegative rheumatoid arthritis ([place])    Dr. Lahoma Rocker   SIRS (systemic inflammatory response syndrome) ([place]) 02/10/2018   Tracheobronchomalacia     Current Outpatient Medications:    acetaminophen (TYLENOL) 650 MG CR tablet, Take 1,300 mg by mouth every 8 (eight) hours as needed for pain., Disp: , Rfl:    albuterol (PROVENTIL) (2.5 MG/3ML) 0.083% nebulizer solution, Take 3 mLs (2.5 mg total) by nebulization every 4 (four) hours as needed for wheezing or shortness of breath., Disp: 75 mL, Rfl: 12   albuterol (VENTOLIN HFA) 108 (90 Base) MCG/ACT inhaler, Inhale 2 puffs into the lungs every 6 (six) hours as needed for wheezing or shortness of breath., Disp: 19 g, Rfl: 1   allopurinol (ZYLOPRIM) 100 MG tablet, Take 1 tablet (100 mg total) by mouth daily., Disp: 90 tablet, Rfl: 3   Alpha-D-Galactosidase (BEANO PO), Take 2 tablets by mouth 3 (three) times daily with meals., Disp: , Rfl:    Alpha-Lipoic Acid 600 MG TABS, Take 600 mg by mouth daily., Disp: ,  Rfl:    atorvastatin (LIPITOR) 10 MG tablet, Take by mouth., Disp: , Rfl:    atorvastatin (LIPITOR) 80 MG tablet, Take 1 tablet (80 mg total) by mouth daily., Disp: 90 tablet, Rfl: 3   azelastine (ASTELIN) 0.1 % nasal spray, Use in each nostril as directed (Patient taking differently: 2 sprays daily as needed for allergies.), Disp: 30 mL, Rfl: 5   budesonide (PULMICORT) 0.5 MG/2ML nebulizer solution, Take 2 mLs (0.5 mg total) by nebulization 2 (two) times daily as needed., Disp: 120 mL, Rfl: 12   CALCIUM-MAGNESIUM PO, Take 1 tablet by mouth every evening., Disp: , Rfl:    cetirizine (ZYRTEC) 10 MG tablet, Take 1 tablet (10 mg total) by mouth 2 (two) times daily as needed for allergies (Can take a n extra dose  during flare ups.)., Disp: 60 tablet, Rfl: 5   cholecalciferol (VITAMIN D) 25 MCG (1000 UNIT) tablet, 1,000 Units every evening., Disp: , Rfl:    clopidogrel (PLAVIX) 75 MG tablet, Take 1 tablet (75 mg total) by mouth daily., Disp: 90 tablet, Rfl: 3   dexlansoprazole (DEXILANT) 60 MG capsule, Take 1 capsule (60 mg total) by mouth daily., Disp: 90 capsule, Rfl: 3   diclofenac Sodium (VOLTAREN) 1 % GEL, Apply 1 application topically 4 (four) times daily as needed (pain)., Disp: , Rfl:    empagliflozin (JARDIANCE) 10 MG TABS tablet, Take 1 tablet (10 mg total) by mouth daily before breakfast., Disp: 30 tablet, Rfl: 5   EPINEPHrine 0.3 mg/0.3 mL IJ SOAJ injection, USE AS DIRECTED BY YOUR PHYSICIAN INTRAMUSCULARLY AS NEEDED FOR ANAPHYLAXIS, Disp: 2 each, Rfl: 1   famotidine (PEPCID) 20 MG tablet, Take 1 tablet (20 mg total) by mouth at bedtime., Disp: 30 tablet, Rfl: 5   fluticasone (FLONASE) 50 MCG/ACT nasal spray, USE 2 SPRAYS IN EACH NOSTRIL DAILY (Patient taking differently: 2 sprays daily as needed for allergies.), Disp: 16 g, Rfl: 2   folic acid (FOLVITE) 1 MG tablet, Take 1 tablet (1 mg total) by mouth daily., Disp: 90 tablet, Rfl: 3   furosemide (LASIX) 40 MG tablet, Take 1 tablet (40 mg total) by mouth daily. Take 1 table twice a day for 3 days and then back to once daily., Disp: 30 tablet, Rfl: 2   Glucose Blood (BLOOD GLUCOSE TEST STRIPS) STRP, Please dispense per patient and insurance preference. Use as directed to monitor FSBS 4x . Dx: E11.65, Disp: 150 strip, Rfl: 1   guaiFENesin (MUCINEX) 600 MG 12 hr tablet, Take 1 tablet (600 mg total) by mouth 2 (two) times daily as needed for cough or to loosen phlegm. (Patient taking differently: Take 600 mg by mouth 2 (two) times daily.), Disp: 60 tablet, Rfl: 1   HIZENTRA 2 GM/10ML SOLN, Inject 1 Dose into the skin once a week., Disp: , Rfl:    Lactase (LACTOSE FAST ACTING RELIEF PO), Take 3 tablets by mouth in the morning, at noon, and at bedtime.,  Disp: , Rfl:    magnesium oxide (MAG-OX) 400 MG tablet, Take 400 mg by mouth every evening., Disp: , Rfl:    montelukast (SINGULAIR) 10 MG tablet, Take 1 tablet (10 mg total) by mouth daily., Disp: 30 tablet, Rfl: 5   Nystatin (GERHARDT'S BUTT CREAM) CREA, Apply topically 3 times a day as needed for itching or irritation, Disp: 4 each, Rfl: 6   nystatin (MYCOSTATIN) 100000 UNIT/ML suspension, Use as directed 15 mLs in the mouth or throat at bedtime as needed (mouth sores)., Disp: , Rfl:  nystatin ointment (MYCOSTATIN), Apply 1 application topically 2 (two) times daily as needed (yeast)., Disp: , Rfl:    pantoprazole (PROTONIX) 40 MG tablet, Take 1 tablet (40 mg total) by mouth daily., Disp: 30 tablet, Rfl: 3   potassium chloride SA (KLOR-CON M20) 20 MEQ tablet, Take 1 tablet (20 mEq total) by mouth daily., Disp: 90 tablet, Rfl: 3   predniSONE (DELTASONE) 1 MG tablet, Take 3 mg by mouth as directed. Take along with 5 mg tablet=8 mg, Disp: , Rfl:    Prenatal Vit-Fe Fumarate-FA (PRENATAL VITAMINS PO), Take 1 tablet by mouth daily., Disp: , Rfl:    PRESCRIPTION MEDICATION, Place 4 drops into both ears See admin instructions. Chloramp- sulfu - ampho d-h droc 100-100-5 hydrocortisone, otic ear drops. Instill 4 drops in to each ear daily for the first 5 days of each month, Disp: , Rfl:    Probiotic Product (ALIGN PO), Take 1 capsule by mouth daily., Disp: , Rfl:    Propylene Glycol (SYSTANE COMPLETE) 0.6 % SOLN, Place 1 drop into both eyes 2 (two) times daily., Disp: 15 mL, Rfl: 5   Tiotropium Bromide-Olodaterol (STIOLTO RESPIMAT) 2.5-2.5 MCG/ACT AERS, Inhale 2 puffs into the lungs daily., Disp: 4 g, Rfl: 5   traMADol (ULTRAM) 50 MG tablet, Take 1 tablet (50 mg total) by mouth at bedtime as needed., Disp: 30 tablet, Rfl: 1   XARELTO 20 MG TABS tablet, TAKE 1 TABLET BY MOUTH DAILY WITH SUPPER, Disp: 90 tablet, Rfl: 3 No current facility-administered medications for this visit.  Facility-Administered  Medications Ordered in Other Visits:    iohexol (OMNIPAQUE) 350 MG/ML injection, , , PRN, Leonie Man, MD, 105 mL at 01/11/21 1610  Social History   Tobacco Use  Smoking Status Never   Passive exposure: Yes  Smokeless Tobacco Never  Tobacco Comments   Parents    Allergies  Allergen Reactions   Baclofen Other (See Comments)    Altered mental status requiring a 3-day hospital stay- patient became unresponsive   Dust Mite Extract Shortness Of Breath and Other (See Comments)    "sneezing" (02/20/2012)   Molds & Smuts Shortness Of Breath   Morphine And Related Hives and Itching   Other Shortness Of Breath and Other (See Comments)    Grass and weeds "sneezing; filled sinuses" (02/20/2012)   Penicillins Rash and Other (See Comments)    "welts" (02/20/2012) Tolerated Ancef 02/16/20.    Rofecoxib Swelling and Other (See Comments)    Vioxx- feet swelling    Shellfish Allergy Anaphylaxis, Shortness Of Breath, Itching and Rash   Tetracycline Hcl Nausea And Vomiting   Xolair [Omalizumab] Other (See Comments)    Caused Blood clot   Zoledronic Acid Other (See Comments)    Fever, Put in hospital, dr said it was a reaction from a reaction  Other reaction(s): Unknown   Dilaudid [Hydromorphone Hcl] Itching   Levofloxacin Other (See Comments)    GI upset   Oxycodone Hcl Nausea And Vomiting   Paroxetine Nausea And Vomiting and Other (See Comments)    Paxil    Celecoxib Swelling and Other (See Comments)    Feet swelling   Diltiazem Hcl Swelling   Hydrocodone-Acetaminophen Other (See Comments)    Unknown reaction   Lactose Intolerance (Gi) Other (See Comments)    Bloating and gas   Morphine Sulfate Other (See Comments)    "Gets loopy"   Oxycodone-Acetaminophen Other (See Comments)    Unknown reaction   Rituximab     Anemia  Tree Extract Other (See Comments)    "tested and told I was allergic to it; never experienced a reaction to it" (02/20/2012)   Penicillin G Procaine Rash and  Other (See Comments)    Welts, also   Objective:  There were no vitals filed for this visit. There is no height or weight on file to calculate BMI. Constitutional Well developed. Well nourished.  Vascular Dorsalis pedis pulses palpable bilaterally. Posterior tibial pulses palpable bilaterally. Capillary refill normal to all digits.  No cyanosis or clubbing noted. Pedal hair growth normal.  Neurologic Normal speech. Oriented to person, place, and time. Epicritic sensation to light touch grossly present bilaterally.  Dermatologic Nails well groomed and normal in appearance. No open wounds. No skin lesions.  Orthopedic: Hammertoe contracture of the right second digit with contracture primarily at the metatarsophalangeal joint of the second.  Negative plantar plate rupture with Callaghan push-up stress test.  Pain on palpation to the contracture.  Left dorsal soft tissue mass noted.  Pain on palpation to the mass.  It is indurated hard single lobulated.  Negative transilluminates.   Radiographs: 3 views of skeletally mature adult bilateral foot: Left dorsal soft tissue mass noted likely coming from the underlying talonavicular joint arthritis.  Previous stress fracture of the fourth metatarsal and fifth metatarsal noted.  Some callus formation.  Correlate clinically.  No other bony abnormalities identified Right foot hammertoe contracture noted of the second digit.  Contracture of the joint noted.  No other bony abnormalities identified. Assessment:   1. Hammertoe of second toe of right foot   2. Joint contracture of foot, right   3. Mass of soft tissue of left upper extremity    Plan:  Patient was evaluated and treated and all questions answered.  Right second digit hammertoe contracture with underlying metatarsophalangeal joint contracture of the second -All questions and concerns were discussed with the patient in extensive detail -Given that she continues to have pain and has failed  all conservative treatment options I believe she will benefit from surgical intervention to correct the hammertoe as well as release the second metatarsal phalangeal joint with a K wire fixation.  I discussed my surgical plan preoperative intraoperative postoperative plan in extensive detail.  Patient agrees with plan like to proceed with the surgery.  Left dorsal soft tissue mass -I explained the patient the etiology of the mass versus treatment options were discussed.  Patient would like for me to remove the mass.  She does not want to address the underlying arthritis at this time.  She is tried conservative protecting and shoe gear modification none of which has helped.  She would like to have it removed I discussed my preoperative intraoperative postoperative plan in extensive detail.  She states understanding. -She will be in a surgical shoe to the left side.  Stress fracture noted to the left fourth and fifth -Clinically she is not having any pain.  She has bony callus formation.  She is ambulating in regular shoes.  I discussed with her that if she starts having a lot of pain to place her self in the cam boot.  She states understanding.  No follow-ups on file.

## 2021-04-11 NOTE — Telephone Encounter (Signed)
LOV 02/24/21 Last refill 02/10/21, #30, 1 refill  Please review, thanks!

## 2021-04-12 ENCOUNTER — Ambulatory Visit (INDEPENDENT_AMBULATORY_CARE_PROVIDER_SITE_OTHER): Payer: Medicare PPO

## 2021-04-12 ENCOUNTER — Other Ambulatory Visit: Payer: Self-pay

## 2021-04-12 VITALS — BP 134/62 | HR 67 | Temp 98.5°F | Resp 18 | Wt 113.8 lb

## 2021-04-12 DIAGNOSIS — E1151 Type 2 diabetes mellitus with diabetic peripheral angiopathy without gangrene: Secondary | ICD-10-CM | POA: Diagnosis not present

## 2021-04-12 DIAGNOSIS — D509 Iron deficiency anemia, unspecified: Secondary | ICD-10-CM

## 2021-04-12 DIAGNOSIS — E1122 Type 2 diabetes mellitus with diabetic chronic kidney disease: Secondary | ICD-10-CM | POA: Diagnosis not present

## 2021-04-12 DIAGNOSIS — M71121 Other infective bursitis, right elbow: Secondary | ICD-10-CM | POA: Diagnosis not present

## 2021-04-12 DIAGNOSIS — F32A Depression, unspecified: Secondary | ICD-10-CM | POA: Diagnosis not present

## 2021-04-12 DIAGNOSIS — N1832 Chronic kidney disease, stage 3b: Secondary | ICD-10-CM | POA: Diagnosis not present

## 2021-04-12 DIAGNOSIS — I5042 Chronic combined systolic (congestive) and diastolic (congestive) heart failure: Secondary | ICD-10-CM | POA: Diagnosis not present

## 2021-04-12 DIAGNOSIS — D631 Anemia in chronic kidney disease: Secondary | ICD-10-CM | POA: Diagnosis not present

## 2021-04-12 DIAGNOSIS — D62 Acute posthemorrhagic anemia: Secondary | ICD-10-CM | POA: Diagnosis not present

## 2021-04-12 DIAGNOSIS — K219 Gastro-esophageal reflux disease without esophagitis: Secondary | ICD-10-CM | POA: Diagnosis not present

## 2021-04-12 MED ORDER — FAMOTIDINE IN NACL 20-0.9 MG/50ML-% IV SOLN: 20.0000 mg | Freq: Once | INTRAVENOUS | Status: DC | PRN

## 2021-04-12 MED ORDER — ALBUTEROL SULFATE HFA 108 (90 BASE) MCG/ACT IN AERS: 2.0000 | INHALATION_SPRAY | Freq: Once | RESPIRATORY_TRACT | Status: DC | PRN

## 2021-04-12 MED ORDER — SODIUM CHLORIDE 0.9 % IV SOLN
200.0000 mg | Freq: Once | INTRAVENOUS | Status: AC
  Administered 2021-04-12: 200 mg via INTRAVENOUS
  Filled 2021-04-12: qty 10

## 2021-04-12 MED ORDER — DIPHENHYDRAMINE HCL 50 MG/ML IJ SOLN: 50.0000 mg | Freq: Once | INTRAMUSCULAR | Status: DC | PRN

## 2021-04-12 MED ORDER — METHYLPREDNISOLONE SODIUM SUCC 125 MG IJ SOLR: 125.0000 mg | Freq: Once | INTRAMUSCULAR | Status: DC | PRN

## 2021-04-12 MED ORDER — SODIUM CHLORIDE 0.9 % IV SOLN: Freq: Once | INTRAVENOUS | Status: DC | PRN

## 2021-04-12 MED ORDER — EPINEPHRINE 0.3 MG/0.3ML IJ SOAJ: 0.3000 mg | Freq: Once | INTRAMUSCULAR | Status: DC | PRN

## 2021-04-12 NOTE — Progress Notes (Signed)
Diagnosis: Iron Deficiency Anemia  Provider:  Marshell Garfinkel, MD  Procedure: Infusion  IV Type: Peripheral, IV Location: R Antecubital  Venofer (Iron Sucrose), Dose: 200 mg  Infusion Start Time: 13.19 04/12/2021  Infusion Stop Time: 13.40 04/12/2021  Post Infusion IV Care: Peripheral IV Discontinued  Discharge: Condition: Good, Destination: Home . AVS provided to patient.   Performed by:  Arnoldo Morale, RN

## 2021-04-13 DIAGNOSIS — D631 Anemia in chronic kidney disease: Secondary | ICD-10-CM | POA: Diagnosis not present

## 2021-04-13 DIAGNOSIS — K219 Gastro-esophageal reflux disease without esophagitis: Secondary | ICD-10-CM | POA: Diagnosis not present

## 2021-04-13 DIAGNOSIS — N1832 Chronic kidney disease, stage 3b: Secondary | ICD-10-CM | POA: Diagnosis not present

## 2021-04-13 DIAGNOSIS — E1122 Type 2 diabetes mellitus with diabetic chronic kidney disease: Secondary | ICD-10-CM | POA: Diagnosis not present

## 2021-04-13 DIAGNOSIS — D62 Acute posthemorrhagic anemia: Secondary | ICD-10-CM | POA: Diagnosis not present

## 2021-04-13 DIAGNOSIS — E1151 Type 2 diabetes mellitus with diabetic peripheral angiopathy without gangrene: Secondary | ICD-10-CM | POA: Diagnosis not present

## 2021-04-13 DIAGNOSIS — F32A Depression, unspecified: Secondary | ICD-10-CM | POA: Diagnosis not present

## 2021-04-13 DIAGNOSIS — M71121 Other infective bursitis, right elbow: Secondary | ICD-10-CM | POA: Diagnosis not present

## 2021-04-13 DIAGNOSIS — I5042 Chronic combined systolic (congestive) and diastolic (congestive) heart failure: Secondary | ICD-10-CM | POA: Diagnosis not present

## 2021-04-14 DIAGNOSIS — M1712 Unilateral primary osteoarthritis, left knee: Secondary | ICD-10-CM | POA: Diagnosis not present

## 2021-04-18 ENCOUNTER — Other Ambulatory Visit: Payer: Self-pay | Admitting: Podiatry

## 2021-04-18 ENCOUNTER — Telehealth: Payer: Self-pay

## 2021-04-18 ENCOUNTER — Ambulatory Visit: Payer: Medicare PPO | Admitting: Allergy and Immunology

## 2021-04-18 ENCOUNTER — Telehealth: Payer: Self-pay | Admitting: Urology

## 2021-04-18 DIAGNOSIS — D62 Acute posthemorrhagic anemia: Secondary | ICD-10-CM | POA: Diagnosis not present

## 2021-04-18 DIAGNOSIS — I5042 Chronic combined systolic (congestive) and diastolic (congestive) heart failure: Secondary | ICD-10-CM | POA: Diagnosis not present

## 2021-04-18 DIAGNOSIS — N1832 Chronic kidney disease, stage 3b: Secondary | ICD-10-CM | POA: Diagnosis not present

## 2021-04-18 DIAGNOSIS — M71121 Other infective bursitis, right elbow: Secondary | ICD-10-CM | POA: Diagnosis not present

## 2021-04-18 DIAGNOSIS — K219 Gastro-esophageal reflux disease without esophagitis: Secondary | ICD-10-CM | POA: Diagnosis not present

## 2021-04-18 DIAGNOSIS — D631 Anemia in chronic kidney disease: Secondary | ICD-10-CM | POA: Diagnosis not present

## 2021-04-18 DIAGNOSIS — M778 Other enthesopathies, not elsewhere classified: Secondary | ICD-10-CM

## 2021-04-18 DIAGNOSIS — E1151 Type 2 diabetes mellitus with diabetic peripheral angiopathy without gangrene: Secondary | ICD-10-CM | POA: Diagnosis not present

## 2021-04-18 DIAGNOSIS — E1122 Type 2 diabetes mellitus with diabetic chronic kidney disease: Secondary | ICD-10-CM | POA: Diagnosis not present

## 2021-04-18 DIAGNOSIS — F32A Depression, unspecified: Secondary | ICD-10-CM | POA: Diagnosis not present

## 2021-04-18 NOTE — Telephone Encounter (Signed)
DOS - 05/15/21  HAMMERTOE REPAIR 2ND RIGHT --- 57322 CAPSULOTOMY, MPJ RELEASE 2ND RIGHT --- 28270 EXCISION OF SOFT TISSUE MASS LEFT --- 02542  [employer] EFFECTIVE DATE - 02/20/19   PER [facility] WEBSITE CPT CODES 70623 AND 76283 NO PRIOR AUTH IS REQUIRED. FOR CPT CODE 15176 HAS BEEN APPROVED, AUTH # [id number], GOOD FROM 05/15/21 - 08/13/21.  TRACKING # [id number]

## 2021-04-18 NOTE — Telephone Encounter (Signed)
LVM for Tish giving verbal approval for requested orders. Nothing further needed at this time.

## 2021-04-18 NOTE — Telephone Encounter (Signed)
Tish RN from [facility] called in to get verbal order for pt to start receiving PT 1X a week for 2 weeks starting asap please. Please advise.  Cb#: Corporate investment banker with [facility] [phone]

## 2021-04-20 DIAGNOSIS — M71121 Other infective bursitis, right elbow: Secondary | ICD-10-CM | POA: Diagnosis not present

## 2021-04-20 DIAGNOSIS — D631 Anemia in chronic kidney disease: Secondary | ICD-10-CM | POA: Diagnosis not present

## 2021-04-20 DIAGNOSIS — D62 Acute posthemorrhagic anemia: Secondary | ICD-10-CM | POA: Diagnosis not present

## 2021-04-20 DIAGNOSIS — I5042 Chronic combined systolic (congestive) and diastolic (congestive) heart failure: Secondary | ICD-10-CM | POA: Diagnosis not present

## 2021-04-20 DIAGNOSIS — E1151 Type 2 diabetes mellitus with diabetic peripheral angiopathy without gangrene: Secondary | ICD-10-CM | POA: Diagnosis not present

## 2021-04-20 DIAGNOSIS — E1122 Type 2 diabetes mellitus with diabetic chronic kidney disease: Secondary | ICD-10-CM | POA: Diagnosis not present

## 2021-04-20 DIAGNOSIS — K219 Gastro-esophageal reflux disease without esophagitis: Secondary | ICD-10-CM | POA: Diagnosis not present

## 2021-04-20 DIAGNOSIS — N1832 Chronic kidney disease, stage 3b: Secondary | ICD-10-CM | POA: Diagnosis not present

## 2021-04-20 DIAGNOSIS — F32A Depression, unspecified: Secondary | ICD-10-CM | POA: Diagnosis not present

## 2021-04-21 DIAGNOSIS — M1712 Unilateral primary osteoarthritis, left knee: Secondary | ICD-10-CM | POA: Diagnosis not present

## 2021-04-26 ENCOUNTER — Telehealth: Payer: Self-pay

## 2021-04-26 DIAGNOSIS — I5042 Chronic combined systolic (congestive) and diastolic (congestive) heart failure: Secondary | ICD-10-CM | POA: Diagnosis not present

## 2021-04-26 DIAGNOSIS — F32A Depression, unspecified: Secondary | ICD-10-CM | POA: Diagnosis not present

## 2021-04-26 DIAGNOSIS — E1122 Type 2 diabetes mellitus with diabetic chronic kidney disease: Secondary | ICD-10-CM | POA: Diagnosis not present

## 2021-04-26 DIAGNOSIS — E1151 Type 2 diabetes mellitus with diabetic peripheral angiopathy without gangrene: Secondary | ICD-10-CM | POA: Diagnosis not present

## 2021-04-26 DIAGNOSIS — N1832 Chronic kidney disease, stage 3b: Secondary | ICD-10-CM | POA: Diagnosis not present

## 2021-04-26 DIAGNOSIS — D62 Acute posthemorrhagic anemia: Secondary | ICD-10-CM | POA: Diagnosis not present

## 2021-04-26 DIAGNOSIS — M71121 Other infective bursitis, right elbow: Secondary | ICD-10-CM | POA: Diagnosis not present

## 2021-04-26 DIAGNOSIS — D631 Anemia in chronic kidney disease: Secondary | ICD-10-CM | POA: Diagnosis not present

## 2021-04-26 DIAGNOSIS — K219 Gastro-esophageal reflux disease without esophagitis: Secondary | ICD-10-CM | POA: Diagnosis not present

## 2021-04-26 NOTE — Telephone Encounter (Signed)
Radovan w/[facility] called to report patient is being discharged from OT services at this time. Patient has met current max potential and has run out of insurance benefits for this round of therapy. OT discontinued as of today.  ? ?FYI, thanks! ?

## 2021-04-27 ENCOUNTER — Telehealth: Payer: Self-pay | Admitting: Internal Medicine

## 2021-04-27 NOTE — Telephone Encounter (Signed)
Patient called today during her infusion.  She states since starting her infusions her abdomen has been distended.  Her infusion nurse told her today that it is extra large and very hard.  She has an appointment scheduled with Dr. Carlean Purl 3/15.  I just wanted to give you a head's up in case you needed to do something sooner.  Thank you. ?

## 2021-04-27 NOTE — Progress Notes (Signed)
Office Visit Note  Patient: Ebony Scott             Date of Birth: [birth year]           MRN: [id number]             PCP: [name], MD Referring: [name], MD Visit Date: 04/28/2021   Subjective:  Follow-up (Back it itching, bumps on back and neck)   History of Present Illness: Ebony Scott is a 71 y.o. female here for follow up for seronegative RA and cushing's syndrome on prednisone with gradual tapering from 8 mg in December. She developed worsening iron deficiency anemia after GI bleed getting venofer replacement. Joint and skin problems were remaining pretty stable until about 2 weeks ot a month ago. This was after decreasing prednisone to 6 mg daily dose. Knee pain limits her mobility but hands have the most increase in swelling. Skin rash on the back of her neck and throughout her low back. She tried topical hydrocortisone without a large improvement so far.  Previous HPI 01/23/21 Ebony Scott is a 71 y.o. female here for follow up for seronegative RA minimally responsive to multiple DMARD treatments on prednisone 10 mg PO daily with acquired cushing's syndrome. She has noticed some increase in right hand MCP joint swelling but overall joints not dramatically changed with dose reduction. Since our visit she went for coronary artery stent placement after study for evaluating cardiomyopathy found significant stenosis. No anginal symptoms, she is not sure if any different but husband reports noticing some improvement in her dyspnea on exertion. She has had numerous other issues increased upper airway congestion and drainage. Sometimes productive coughing with this. She has had mild nasal bleeding with rhinitis and and sneezing issues.  Previous HPI 11/28/20 Ebony Scott is a 71 y.o. female here for iatrogenic cushing syndrome and history of rheumatoid arthritis with long term prednisone treatment, previously seen by Dr. Kathlene November at [facility] for this problem. Mostly had been  taking treatments after hospitalization with severe joint inflammation in 2020. She has tried taking numerous DMARD treatments for years including actemra, simponi, abatacept, and rituximab so far without great response or tolerance. Chronic prednisone required for symptoms throughout this time but has taken these longstanding for asthma disease already. She has had some issue with recurrent infections with pulmonary and septic bursitis and more recently noninfectious allergy symptoms managed with Dr. Neldon Mc and Erin Fulling. She started on IVIG treatments with Dr. Neldon Mc for this identified recently, not sure if primary or related to previous medication   DMARD Hx Rituximab Abatacept Simponi Actemra IV   Review of Systems  Constitutional:  Positive for fatigue.  HENT:  Positive for mouth dryness.   Eyes:  Positive for dryness.  Respiratory:  Positive for shortness of breath.   Cardiovascular:  Positive for swelling in legs/feet.  Gastrointestinal:  Negative for constipation.  Endocrine: Positive for cold intolerance and heat intolerance.  Genitourinary:  Negative for decreased urine output.  Musculoskeletal:  Positive for joint pain, gait problem, joint pain, joint swelling, muscle weakness, morning stiffness and muscle tenderness.  Skin:  Positive for rash and nodules/bumps.  Allergic/Immunologic: Positive for susceptible to infections.  Neurological:  Positive for weakness.  Hematological:  Positive for bruising/bleeding tendency.  Psychiatric/Behavioral:  Positive for sleep disturbance.    PMFS History:  Patient Active Problem List   Diagnosis Date Noted   Rash and other nonspecific skin eruption 04/28/2021   Iron deficiency anemia  02/24/2021   GAVE (gastric antral vascular ectasia)    Symptomatic anemia 02/04/2021   Anticoagulated    Antiplatelet or antithrombotic long-term use    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[place])  01/11/2021   Coronary artery calcification seen on CAT scan  01/11/2021   ARF (acute renal failure) ([place]) 10/04/2020   Physical deconditioning 09/25/2020   History of DVT (deep vein thrombosis) 09/24/2020   Osteomyelitis of second toe of right foot ([place]) 09/23/2020   DMII (diabetes mellitus, type 2) ([place]) 09/23/2020   Cellulitis of right hand 09/22/2020   Septic olecranon bursitis of right elbow 09/22/2020   Abscess of dorsum of right hand 09/22/2020   Hoarseness 83/41/9622   Metabolic encephalopathy 29/79/8921   Acute encephalopathy    AMS (altered mental status) 02/23/2020   S/P reverse total shoulder arthroplasty, right    Hypokalemia    DOE (dyspnea on exertion) 02/18/2020   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   S/P reverse total shoulder arthroplasty, left 02/16/2020   Preoperative clearance 01/26/2020   Closed displaced fracture of metatarsal bone of right foot 01/20/2019   History of pulmonary embolus (PE) 12/19/2018   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   Rheumatoid arthritis ([place])    Bilateral impacted cerumen 08/18/2018   Fungal otitis externa 08/18/2018   Asthmatic bronchitis with exacerbation 07/29/2018   Pulmonary embolism ([place]) 07/29/2018   Cellulitis of forearm    Fever of unknown origin (FUO) 02/10/2018   Abdominal pain 02/10/2018   Fracture of base of fifth metatarsal bone with routine healing, left    Closed fracture of fifth metatarsal bone of right foot, initial encounter 01/15/2018   DVT (deep venous thrombosis) ([place]) 01/14/2018   Hypothyroidism 01/14/2018   Depression 01/14/2018   Debility    Malnutrition of moderate degree 12/17/2017   Knee pain    History of breast cancer    Chronic pain syndrome    Fibromyalgia    Graves disease    Vasculitis ([place]) 12/13/2017   Rhabdomyolysis 12/13/2017   Dysphonia 08/10/2017   Pulmonary nodules    Chronic kidney disease, stage 3b ([place]) 04/15/2016   Hx of adenomatous colonic polyps 04/12/2016   Cough 02/09/2016   Opacity of lung on imaging study 11/03/2015    Severe persistent asthma 02/08/2015   Facial pain syndrome 02/08/2015   Insomnia 02/08/2015   Other allergic rhinitis 02/08/2015   LPRD (laryngopharyngeal reflux disease) 02/08/2015   Chronic combined systolic and diastolic CHF (congestive heart failure) ([place]) 02/08/2015   Benign paroxysmal positional vertigo 12/03/2012   Dizziness and giddiness 10/29/2012   Disequilibrium 10/29/2012   Pseudogout 02/24/2012   Pneumonia 05/31/2011   Irritable bowel syndrome 01/02/2010   Hyperlipidemia 12/23/2009   Hyperthyroidism 11/12/2006   Seasonal and perennial allergic rhinitis 11/12/2006   GERD 11/12/2006   NECK PAIN, CHRONIC 11/12/2006   OSTEOPOROSIS 11/12/2006   BREAST CANCER, HX OF 11/12/2006    Past Medical History:  Diagnosis Date   Abscess of dorsum of right hand 09/22/2020   Acute deep vein thrombosis (DVT) of right lower extremity ([place]) 12/13/2017   Allergic rhinitis    Anemia    Angio-edema    Anxiety    pt denies   Arthritis    Phreesia 10/21/2019   Asthma    Phreesia 10/21/2019   Breast cancer ([place]) 1998   in remission   Cataract    REMOVED   Clostridium difficile colitis 01/14/2018   Clostridium difficile diarrhea 19/41/7408   Complication of anesthesia    "had hard time waking up from  it several times" (02/20/2012)   Depression    "some; don't take anything for it" (02/20/2012)   Diverticulosis    DVT (deep venous thrombosis) ([place])    Fibromyalgia 11/2011   GERD (gastroesophageal reflux disease)    Graves disease    Headache(784.0)    "related to allergies; more at different times during the year" (02/20/2012)   Hemorrhoids    Hiatal hernia    back and neck   Hx of adenomatous colonic polyps 04/12/2016   Hypercholesteremia    good cholesterol is high   Hypothyroidism    IBS (irritable bowel syndrome)    Moderate persistent asthma    -FeV1 72% 2011, -IgE 102 2011, CT sinus Neg 2011   Osteoporosis    on reclast yearly   Peripheral vascular disease ([place]) 2019    DVTs   Pneumonia 04/2011; ~ 11/2011   "double; single" (02/20/2012)   Septic olecranon bursitis of right elbow 09/22/2020   Seronegative rheumatoid arthritis ([place])    Dr. Lahoma Rocker   SIRS (systemic inflammatory response syndrome) ([place]) 02/10/2018   Tracheobronchomalacia     Family History  Problem Relation Age of Onset   Allergies Mother    Heart disease Mother    Arthritis Mother    Lung cancer Mother        heavy smoker   Diabetes Mother    Allergies Father    Heart disease Father    Arthritis Father    Stroke Father    Heart disease Brother    Diabetes Maternal Grandfather    Colitis Daughter    Colon cancer Other        Maternal half uncle   Past Surgical History:  Procedure Laterality Date   ABDOMINAL HYSTERECTOMY N/A    Phreesia 10/21/2019   ANTERIOR AND POSTERIOR REPAIR  1990's   APPENDECTOMY     ARGON LASER APPLICATION  54/27/0623   Procedure: ARGON LASER APPLICATION;  Surgeon: Yetta Flock, MD;  Location: [facility];  Service: Gastroenterology;;   BREAST LUMPECTOMY  1998   left   BREAST SURGERY N/A    Phreesia 10/21/2019   BRONCHIAL WASHINGS  04/05/2020   Procedure: BRONCHIAL WASHINGS;  Surgeon: Freddi Starr, MD;  Location: [facility];  Service: Pulmonary;;   CARPOMETACARPEL ([place]) FUSION OF THUMB WITH AUTOGRAFT FROM RADIUS  ~ 2009   "both thumbs" (02/20/2012)   CATARACT EXTRACTION W/ INTRAOCULAR LENS  IMPLANT, BILATERAL  2012   CERVICAL DISCECTOMY  10/2001   C5-C6   CERVICAL FUSION  2003   C3-C4   CHOLECYSTECTOMY     COLONOSCOPY     CORONARY STENT INTERVENTION N/A 01/11/2021   Procedure: CORONARY STENT INTERVENTION;  Surgeon: Leonie Man, MD;  Location: [place] [facility];  Service: Cardiovascular;  Laterality: N/A;   DEBRIDEMENT TENNIS ELBOW  ?1970's   right   ESOPHAGOGASTRODUODENOSCOPY     ESOPHAGOGASTRODUODENOSCOPY (EGD) WITH PROPOFOL N/A 02/05/2021   Procedure: ESOPHAGOGASTRODUODENOSCOPY (EGD) WITH PROPOFOL;  Surgeon:  Yetta Flock, MD;  [facility]: [facility];  Service: Gastroenterology;  Laterality: N/A;   EYE SURGERY N/A    Phreesia 10/21/2019   HYSTERECTOMY     I & D EXTREMITY Right 09/22/2020   Procedure: IRRIGATION AND DEBRIDEMENT RIGHT HAND AND ELBOW;  Surgeon: Marchia Bond, MD;  Location: [facility];  Service: Orthopedics;  Laterality: Right;   KNEE ARTHROPLASTY  ?1990's   "?right; w/cartilage repair" (02/20/2012)   NASAL SEPTUM SURGERY  1980's   POSTERIOR CERVICAL FUSION/FORAMINOTOMY  2004   "  failed initial fusion; rewired  anterior neck" (02/20/2012)   REVERSE SHOULDER ARTHROPLASTY Right 02/16/2020   Procedure: REVERSE SHOULDER ARTHROPLASTY;  Surgeon: Marchia Bond, MD;  Location: [facility];  Service: Orthopedics;  Laterality: Right;   RIGHT/LEFT HEART CATH AND CORONARY ANGIOGRAPHY N/A 01/11/2021   Procedure: RIGHT/LEFT HEART CATH AND CORONARY ANGIOGRAPHY;  Surgeon: Leonie Man, MD;  Location: [place] [facility];  Service: Cardiovascular;  Laterality: N/A;   SPINE SURGERY N/A    Phreesia 10/21/2019   TONSILLECTOMY  ~ 1953   VESICOVAGINAL FISTULA CLOSURE W/ TAH  1988   VIDEO BRONCHOSCOPY Bilateral 08/23/2016   Procedure: VIDEO BRONCHOSCOPY WITH FLUORO;  Surgeon: Javier Glazier, MD;  [facility]: [facility];  Service: Cardiopulmonary;  Laterality: Bilateral;   VIDEO BRONCHOSCOPY N/A 04/05/2020   Procedure: VIDEO BRONCHOSCOPY WITHOUT FLUORO;  Surgeon: Freddi Starr, MD;  [facility];  Service: Pulmonary;  Laterality: N/A;   Social History   Social History Narrative   Patient lives at home alone. Patient  divorced.    Patient has her BS degree.   Right handed.   Caffeine- sometimes coffee.      [facility]:   Born in [place], [place]. She worked as a Copywriter, advertising. She has no pets currently. She does have indoor plants. Previously had mold in her home that was remediated. Carpet was removed.          Immunization History  Administered Date(s) Administered   DTaP  08/18/2013   Fluad Quad(high Dose 65+) 10/16/2018, 11/20/2019, 11/03/2020   Hepatitis A 09/04/2007, 03/02/2008   Hepatitis B 01/07/1985, 02/06/1985, 08/17/1985   Influenza Split 11/13/2010, 11/22/2011, 10/20/2012   Influenza Whole 11/14/2009, 11/21/2011   Influenza, High Dose Seasonal PF 11/09/2015, 11/27/2017   Influenza, Quadrivalent, Recombinant, Inj, Pf 11/20/2019   Influenza,inj,Quad PF,6+ Mos 11/06/2013, 11/09/2014, 11/06/2016   Meningococcal Conjugate 09/04/2007   PFIZER(Purple Top)SARS-COV-2 Vaccination 03/12/2019, 04/01/2019, 12/16/2019, 06/29/2020   Pfizer Covid-19 Vaccine Bivalent Booster 57yrs & up 03/02/2021   Pneumococcal Conjugate-13 05/18/2013   Pneumococcal Polysaccharide-23 01/05/1994, 11/28/2011, 05/27/2017   Td 07/24/1995, 03/16/2005   Tdap 08/18/2013   Zoster Recombinat (Shingrix) 09/18/2017   Zoster, Live 03/02/2008     Objective: Vital Signs: BP (!) 162/64 (BP Location: Right Arm, Patient Position: Sitting, Cuff Size: Normal)    Pulse 76    Resp (!) 22    Ht $R'5\' 1"'Ns$  (1.549 m)    Wt 129 lb (58.5 kg)    BMI 24.37 kg/m    Physical Exam Cardiovascular:     Rate and Rhythm: Normal rate and regular rhythm.  Pulmonary:     Effort: Pulmonary effort is normal.     Breath sounds: Normal breath sounds.  Skin:    General: Skin is warm and dry.     Findings: Rash present.     Comments: Faintly erythematous rash in flexural creases back of neck with small papules, similar small patches on lower back but with numerous overlying excoriations (Photo below)         Musculoskeletal Exam:  Neck no tenderness, well healed posterior surgical scar Shoulders full ROM no tenderness or swelling Elbows full ROM no tenderness or swelling Wrists slightly decreased flexion and extension motion no palpable swelling, chornic hypertrophy over MCP joints of bilateral hands with probable pannus at right 3rd mcp, heberdon's nodes extensive in IP joints b/l Knees full ROM tenderness to  palpation of joint line, patellofemoral crepitus bilaterally, left knee small effusion Ankles full ROM no tenderness or swelling  CDAI  Exam: CDAI Score: 15  Patient Global: 30 mm; Provider Global: 40 mm Swollen: 3 ; Tender: 5  Joint Exam 04/28/2021      Right  Left  Glenohumeral   Tender     MCP 2  Swollen Tender     MCP 3  Swollen Tender     MCP 4  Swollen Tender     Knee   Tender        Investigation: No additional findings.  Imaging: DG Foot Complete Left  Result Date: 04/18/2021 Please see detailed radiograph report in office note.   Recent Labs: Lab Results  Component Value Date   WBC 8.6 03/24/2021   HGB 8.9 (L) 03/24/2021   PLT 275 03/24/2021   NA 141 03/14/2021   K 4.2 03/14/2021   CL 103 03/14/2021   CO2 27 03/14/2021   GLUCOSE 142 (H) 03/14/2021   BUN 33 (H) 03/14/2021   CREATININE 1.37 (H) 03/14/2021   BILITOT 0.3 03/14/2021   ALKPHOS 97 03/14/2021   AST 40 03/14/2021   ALT 61 (H) 03/14/2021   PROT 7.5 03/14/2021   ALBUMIN 3.9 03/14/2021   CALCIUM 9.6 03/14/2021   GFRAA 40 (L) 08/25/2020    Speciality Comments: No specialty comments available.  Procedures:  No procedures performed Allergies: Baclofen, Dust mite extract, Molds & smuts, Morphine and related, Other, Penicillins, Rofecoxib, Shellfish allergy, Tetracycline hcl, Xolair [omalizumab], Zoledronic acid, Dilaudid [hydromorphone hcl], Levofloxacin, Oxycodone hcl, Paroxetine, Celecoxib, Diltiazem hcl, Hydrocodone-acetaminophen, Lactose intolerance (gi), Morphine sulfate, Oxycodone-acetaminophen, Rituximab, Tree extract, and Penicillin g procaine   Assessment / Plan:     Visit Diagnoses: Rheumatoid arthritis with negative rheumatoid factor, involving unspecified site ([place]) - Plan: predniSONE (DELTASONE) 5 MG tablet, Sedimentation rate, C-reactive protein  Symptoms appear worse today with objectively increased hand swelling knee pain hard to say how much is new versus existing OA damage. Checking  ESR and CRP for monitorign suspect some increase present now. Recommend she go back to prednisone 7 mg daily dose and remain at this for now till symptoms improve.  Rash and other nonspecific skin eruption - Plan: triamcinolone ointment (KENALOG) 0.1 %  Looks more like a contact dermatitis but somewhat broad area of the back. No new medication changes to suggest drug reaction besides change in the immunoglobulin shots but those started after the rash. Recommend trial of higher potency topical steroid. She plans to switch back to odorless laundry products.  High risk medication use  Discussed risks with continue prednisone at slightly higher dose I believe this is less than additional of new DMARD medication considering her previous intolerances and other treatments.  Orders: Orders Placed This Encounter  Procedures   Sedimentation rate   C-reactive protein   Meds ordered this encounter  Medications   predniSONE (DELTASONE) 5 MG tablet    Sig: Take 1 tablet (5 mg total) by mouth daily with breakfast. Along with 1 mg tablets for total daily dose    Dispense:  90 tablet    Refill:  0   triamcinolone ointment (KENALOG) 0.1 %    Sig: Apply 1 application. topically 2 (two) times daily.    Dispense:  30 g    Refill:  0     Follow-Up Instructions: Return in about 3 months (around 07/29/2021) for RA on GC/rash f/u 26mo.   CCollier Salina MD  Note - This record has been created using [employer]  Chart creation errors have been sought, but may not always  have been located.  Such creation errors do not reflect on  the standard of medical care.

## 2021-04-28 ENCOUNTER — Other Ambulatory Visit: Payer: Self-pay

## 2021-04-28 ENCOUNTER — Encounter: Payer: Self-pay | Admitting: Internal Medicine

## 2021-04-28 ENCOUNTER — Ambulatory Visit: Payer: Medicare PPO | Admitting: Internal Medicine

## 2021-04-28 VITALS — BP 162/64 | HR 76 | Resp 22 | Ht 61.0 in | Wt 129.0 lb

## 2021-04-28 DIAGNOSIS — M1120 Other chondrocalcinosis, unspecified site: Secondary | ICD-10-CM

## 2021-04-28 DIAGNOSIS — I776 Arteritis, unspecified: Secondary | ICD-10-CM

## 2021-04-28 DIAGNOSIS — R21 Rash and other nonspecific skin eruption: Secondary | ICD-10-CM | POA: Diagnosis not present

## 2021-04-28 DIAGNOSIS — M0600 Rheumatoid arthritis without rheumatoid factor, unspecified site: Secondary | ICD-10-CM

## 2021-04-28 DIAGNOSIS — G894 Chronic pain syndrome: Secondary | ICD-10-CM

## 2021-04-28 MED ORDER — PREDNISONE 5 MG PO TABS: 5.0000 mg | ORAL_TABLET | Freq: Every day | ORAL | 0 refills | Status: DC

## 2021-04-28 MED ORDER — TRIAMCINOLONE ACETONIDE 0.1 % EX OINT: 1.0000 "application " | TOPICAL_OINTMENT | Freq: Two times a day (BID) | CUTANEOUS | 0 refills | Status: DC

## 2021-04-28 NOTE — Telephone Encounter (Signed)
Pt states that her abdomen has been swollen for months but does seem to be getting worse: Noted by RN at infusion yesterday: Pt states that she is getting Gamunexc in which the medication is given into her abdomen over an hour and a half: for IDA ? Pt does have an appointment with Dr. Carlean Purl on 05/03/2021 prior to next infusion. ?Pt was encouraged to bring medication  that she has been receiving or the package so that Dr. Carlean Purl will know how much infusion she is receiving each week in her abdomen: Pt verbalized understanding with all questions answered.  ?

## 2021-04-29 LAB — C-REACTIVE PROTEIN: CRP: 2.6 mg/L (ref <8.0)

## 2021-04-29 LAB — SEDIMENTATION RATE: Sed Rate: 43 mm/h — ABNORMAL HIGH (ref 0–30)

## 2021-04-30 NOTE — Progress Notes (Signed)
Inflammatory markers are not normal, but less elevated compared to 3 months ago. However with her joint swelling I still recommending going back up with prednisone as discussed at least to 7 mg daily.

## 2021-05-01 ENCOUNTER — Other Ambulatory Visit: Payer: Self-pay | Admitting: *Deleted

## 2021-05-01 ENCOUNTER — Other Ambulatory Visit: Payer: Self-pay | Admitting: Internal Medicine

## 2021-05-01 DIAGNOSIS — M0600 Rheumatoid arthritis without rheumatoid factor, unspecified site: Secondary | ICD-10-CM

## 2021-05-01 DIAGNOSIS — R21 Rash and other nonspecific skin eruption: Secondary | ICD-10-CM

## 2021-05-01 MED ORDER — PREDNISONE 5 MG PO TABS: 5.0000 mg | ORAL_TABLET | Freq: Every day | ORAL | 0 refills | Status: DC

## 2021-05-01 MED ORDER — PREDNISONE 1 MG PO TABS: 2.0000 mg | ORAL_TABLET | ORAL | 0 refills | Status: DC

## 2021-05-03 ENCOUNTER — Ambulatory Visit: Payer: Medicare PPO | Admitting: Internal Medicine

## 2021-05-03 ENCOUNTER — Encounter: Payer: Self-pay | Admitting: Internal Medicine

## 2021-05-03 ENCOUNTER — Other Ambulatory Visit (INDEPENDENT_AMBULATORY_CARE_PROVIDER_SITE_OTHER): Payer: Medicare PPO

## 2021-05-03 ENCOUNTER — Other Ambulatory Visit: Payer: Self-pay | Admitting: Internal Medicine

## 2021-05-03 VITALS — BP 110/58 | HR 72 | Ht 61.0 in | Wt 128.8 lb

## 2021-05-03 DIAGNOSIS — R140 Abdominal distension (gaseous): Secondary | ICD-10-CM | POA: Diagnosis not present

## 2021-05-03 DIAGNOSIS — Z7901 Long term (current) use of anticoagulants: Secondary | ICD-10-CM | POA: Diagnosis not present

## 2021-05-03 DIAGNOSIS — K31819 Angiodysplasia of stomach and duodenum without bleeding: Secondary | ICD-10-CM | POA: Diagnosis not present

## 2021-05-03 DIAGNOSIS — D839 Common variable immunodeficiency, unspecified: Secondary | ICD-10-CM

## 2021-05-03 DIAGNOSIS — D509 Iron deficiency anemia, unspecified: Secondary | ICD-10-CM

## 2021-05-03 DIAGNOSIS — R21 Rash and other nonspecific skin eruption: Secondary | ICD-10-CM

## 2021-05-03 LAB — COMPREHENSIVE METABOLIC PANEL
ALT: 56 U/L — ABNORMAL HIGH (ref 0–35)
AST: 42 U/L — ABNORMAL HIGH (ref 0–37)
Albumin: 3.9 g/dL (ref 3.5–5.2)
Alkaline Phosphatase: 60 U/L (ref 39–117)
BUN: 37 mg/dL — ABNORMAL HIGH (ref 6–23)
CO2: 26 mEq/L (ref 19–32)
Calcium: 9.3 mg/dL (ref 8.4–10.5)
Chloride: 105 mEq/L (ref 96–112)
Creatinine, Ser: 1.37 mg/dL — ABNORMAL HIGH (ref 0.40–1.20)
GFR: 38.39 mL/min — ABNORMAL LOW (ref >60.00)
Glucose, Bld: 128 mg/dL — ABNORMAL HIGH (ref 70–99)
Potassium: 4.4 mEq/L (ref 3.5–5.1)
Sodium: 141 mEq/L (ref 135–145)
Total Bilirubin: 0.5 mg/dL (ref 0.2–1.2)
Total Protein: 6.5 g/dL (ref 6.0–8.3)

## 2021-05-03 LAB — CBC WITH DIFFERENTIAL/PLATELET
Basophils Absolute: 0.1 10*3/uL (ref 0.0–0.1)
Basophils Relative: 0.7 % (ref 0.0–3.0)
Eosinophils Absolute: 0.5 10*3/uL (ref 0.0–0.7)
Eosinophils Relative: 5.4 % — ABNORMAL HIGH (ref 0.0–5.0)
HCT: 35.6 % — ABNORMAL LOW (ref 36.0–46.0)
Hemoglobin: 11.6 g/dL — ABNORMAL LOW (ref 12.0–15.0)
Lymphocytes Relative: 7.3 % — ABNORMAL LOW (ref 12.0–46.0)
Lymphs Abs: 0.7 10*3/uL (ref 0.7–4.0)
MCHC: 32.6 g/dL (ref 30.0–36.0)
MCV: 104.6 fl — ABNORMAL HIGH (ref 78.0–100.0)
Monocytes Absolute: 1 10*3/uL (ref 0.1–1.0)
Monocytes Relative: 11.3 % (ref 3.0–12.0)
Neutro Abs: 6.7 10*3/uL (ref 1.4–7.7)
Neutrophils Relative %: 75.3 % (ref 43.0–77.0)
Platelets: 190 10*3/uL (ref 150.0–400.0)
RBC: 3.4 Mil/uL — ABNORMAL LOW (ref 3.87–5.11)
RDW: 24.2 % — ABNORMAL HIGH (ref 11.5–15.5)
WBC: 8.9 10*3/uL (ref 4.0–10.5)

## 2021-05-03 LAB — FERRITIN: Ferritin: 510.8 ng/mL — ABNORMAL HIGH (ref 10.0–291.0)

## 2021-05-03 NOTE — Patient Instructions (Addendum)
Your provider has requested that you go to the basement level for lab work before leaving today. Press "B" on the elevator. The lab is located at the first door on the left as you exit the elevator. ? ?Due to recent changes in healthcare laws, you may see the results of your imaging and laboratory studies on MyChart before your provider has had a chance to review them.  We understand that in some cases there may be results that are confusing or concerning to you. Not all laboratory results come back in the same time frame and the provider may be waiting for multiple results in order to interpret others.  Please give [place] 48 hours in order for your provider to thoroughly review all the results before contacting the office for clarification of your results.  ? ?You have been scheduled for an abdominal ultrasound at [facility]  on 05/04/21 at 8:00am. Please arrive 20 minutes prior to your appointment for registration. Make certain not to have anything to eat or drink 6 hours prior to your appointment. Should you need to reschedule your appointment, please contact radiology at [phone]. This test typically takes about 30 minutes to perform. ? ? ?I appreciate the opportunity to care for you. ?Silvano Rusk, MD, [facility] ?

## 2021-05-03 NOTE — Telephone Encounter (Signed)
Next Visit: 08/09/2021 ? ?Last Visit: 04/28/2021 ? ?Last Fill: 04/28/2021 ? ?Dx: Rash and other nonspecific skin eruption  ? ?Current Dose per office note on 04/28/2021: triamcinolone ointment (KENALOG) 0.1 % ? ?Okay to refill Kenalog Cream?   ?

## 2021-05-03 NOTE — Progress Notes (Addendum)
? ?Ebony Scott 71 y.o. Oct 22, 1950 [id number] ? ?Assessment & Plan:  ? ?Encounter Diagnoses  ?Name Primary?  ? GAVE (gastric antral vascular ectasia) Yes  ? Iron deficiency anemia, unspecified iron deficiency anemia type   ? Abdominal distention   ? 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 Anticoagulated   ? ?She seems improved though still quite ill with multiple medical problems.  GAVE would explain iron deficiency anemia.  I am not convinced she needs a colonoscopy but a follow-up endoscopy with possible repeat ablation of GAVE would be appropriate.  This would need to be done at the hospital due to the equipment needed and with her tracheobronchomalacia we may need to do that as well plus her other comorbidities (any procedures necessary). ? ?Her her abdominal exam raises some questions of ascites in my mind and she has these prominent abdominal wall veins so wondering about portal hypertension which is something that goes along with GAVE as well.  She has not had any recent cross-sectional imaging to describe the liver other than an unenhanced CT abdomen and pelvis exam back in August 2022 and the liver was reported as unremarkable at that time. ? ?Evaluation as below follow-up to be determined once I review these findings. ? ?Orders Placed This Encounter  ?Procedures  ? US Abdomen Complete  ? CBC with Differential/Platelet  ? Ferritin  ? Comprehensive metabolic panel  ? ? ?CC: [name], MD ?Dr. Neldon Mc ?Dr. Coolidge Breeze will double check that her clopidogrel was to stop on 02/08/2021 ? ?** ? She does also have hx 3 diminutive adenomas in 2018 so colonoscopy needs to be considered ?05/30/2021 ? ?Subjective:  ? ?Chief Complaint: Anemia, bloated ? ?HPI ?Ebony Scott is a 71 year old woman with rheumatoid arthritis, coronary artery disease status post LAD stent November 2022, common variable immune deficiency, tracheal bronchial malacia, DVT/PE, that I have followed over the years for IBS-D, and some  GERD issues who was admitted to the hospital in December 2022 and was found to have a decline in hemoglobin with a history of melena and evaluated by my colleagues and was thought to have GAVE and this was ablated.  EGD by Dr. Havery Moros 02/05/2021. ? ?She was iron deficient and treated with parenteral iron as she is unable to tolerate oral iron.  Last hemoglobin up from 8.3 to 8.96 weeks ago.  Has not had other follow-up labs. ? ?She also has common variable immune deficiency with hypogammaglobulinemia and was on some sort of biologic agent for that and had a reaction which then necessitated the use of IgG therapy which she is on.  She was on something called Hizentra and then was on Gamunex-C. ?She is off Plavix and aspirin now. ? ?She has been having problems with abdominal distention and bloating.  She still feels weak and tired.  At the hospital the thought was to have an elective colonoscopy and follow-up and possible repeat EGD.  She does not feel like she is ready for that. ? ?She is also had a lot of itching and rash symptoms.  She says she needs a left shoulder and left knee replacement but is not in shape for that.  She is followed in rheumatology and they are treating her with prednisone for her rheumatoid arthritis as well. ?Allergies  ?Allergen Reactions  ? Baclofen Other (See Comments)  ?  Altered mental status requiring a 3-day hospital stay- patient became unresponsive  ? Dust Mite Extract Shortness Of Breath and Other (See Comments)  ?  "  sneezing" (02/20/2012)  ? Molds & Smuts Shortness Of Breath  ? Morphine And Related Hives and Itching  ? Other Shortness Of Breath and Other (See Comments)  ?  Grass and weeds ?"sneezing; filled sinuses" (02/20/2012)  ? Penicillins Rash and Other (See Comments)  ?  "welts" (02/20/2012) ?Tolerated Ancef 02/16/20. ?  ? Rofecoxib Swelling and Other (See Comments)  ?  Vioxx- feet swelling ?  ? Shellfish Allergy Anaphylaxis, Shortness Of Breath, Itching and Rash  ?  Tetracycline Hcl Nausea And Vomiting  ? Xolair [Omalizumab] Other (See Comments)  ?  Caused Blood clot  ? Zoledronic Acid Other (See Comments)  ?  Fever, Put in hospital, dr said it was a reaction from a reaction  ?Other reaction(s): Unknown  ? Dilaudid [Hydromorphone Hcl] Itching  ? Levofloxacin Other (See Comments)  ?  GI upset  ? Oxycodone Hcl Nausea And Vomiting  ? Paroxetine Nausea And Vomiting and Other (See Comments)  ?  Paxil ?  ? Celecoxib Swelling and Other (See Comments)  ?  Feet swelling  ? Diltiazem Hcl Swelling  ? Hydrocodone-Acetaminophen Other (See Comments)  ?  Unknown reaction  ? Lactose Intolerance (Gi) Other (See Comments)  ?  Bloating and gas  ? Morphine Sulfate Other (See Comments)  ?  "Gets loopy"  ? Oxycodone-Acetaminophen Other (See Comments)  ?  Unknown reaction  ? Rituximab   ?  Anemia   ? Tree Extract Other (See Comments)  ?  "tested and told I was allergic to it; never experienced a reaction to it" (02/20/2012)  ? Penicillin G Procaine Rash and Other (See Comments)  ?  Welts, also  ? ?Current Meds  ?Medication Sig  ? acetaminophen (TYLENOL) 650 MG CR tablet Take 1,300 mg by mouth every 8 (eight) hours as needed for pain.  ? albuterol (PROVENTIL) (2.5 MG/3ML) 0.083% nebulizer solution Take 3 mLs (2.5 mg total) by nebulization every 4 (four) hours as needed for wheezing or shortness of breath.  ? albuterol (VENTOLIN HFA) 108 (90 Base) MCG/ACT inhaler Inhale 2 puffs into the lungs every 6 (six) hours as needed for wheezing or shortness of breath.  ? allopurinol (ZYLOPRIM) 100 MG tablet Take 1 tablet (100 mg total) by mouth daily.  ? Alpha-D-Galactosidase (BEANO PO) Take 2 tablets by mouth 3 (three) times daily with meals.  ? Alpha-Lipoic Acid 600 MG TABS Take 600 mg by mouth daily.  ? atorvastatin (LIPITOR) 10 MG tablet Take by mouth.  ? atorvastatin (LIPITOR) 80 MG tablet Take 1 tablet (80 mg total) by mouth daily.  ? azelastine (ASTELIN) 0.1 % nasal spray Use in each nostril as directed  (Patient taking differently: 2 sprays daily as needed for allergies.)  ? budesonide (PULMICORT) 0.5 MG/2ML nebulizer solution Take 2 mLs (0.5 mg total) by nebulization 2 (two) times daily as needed.  ? CALCIUM-MAGNESIUM PO Take 1 tablet by mouth every evening.  ? cetirizine (ZYRTEC) 10 MG tablet Take 1 tablet (10 mg total) by mouth 2 (two) times daily as needed for allergies (Can take a n extra dose during flare ups.).  ? cholecalciferol (VITAMIN D) 25 MCG (1000 UNIT) tablet 1,000 Units every evening.  ? dexlansoprazole (DEXILANT) 60 MG capsule Take 1 capsule (60 mg total) by mouth daily.  ? diclofenac Sodium (VOLTAREN) 1 % GEL Apply 1 application topically 4 (four) times daily as needed (pain).  ? empagliflozin (JARDIANCE) 10 MG TABS tablet Take 1 tablet (10 mg total) by mouth daily before breakfast.  ? EPINEPHrine  0.3 mg/0.3 mL IJ SOAJ injection USE AS DIRECTED BY YOUR PHYSICIAN INTRAMUSCULARLY AS NEEDED FOR ANAPHYLAXIS  ? famotidine (PEPCID) 20 MG tablet Take 1 tablet (20 mg total) by mouth at bedtime.  ? fluticasone (FLONASE) 50 MCG/ACT nasal spray USE 2 SPRAYS IN EACH NOSTRIL DAILY (Patient taking differently: 2 sprays daily as needed for allergies.)  ? folic acid (FOLVITE) 1 MG tablet Take 1 tablet (1 mg total) by mouth daily.  ? furosemide (LASIX) 40 MG tablet Take 1 tablet (40 mg total) by mouth daily. Take 1 table twice a day for 3 days and then back to once daily.  ? GAMUNEX-C 2.5 GM/25ML SOLN   ? Glucose Blood (BLOOD GLUCOSE TEST STRIPS) STRP Please dispense per patient and insurance preference. Use as directed to monitor FSBS 4x . Dx: E11.65  ? guaiFENesin (MUCINEX) 600 MG 12 hr tablet Take 1 tablet (600 mg total) by mouth 2 (two) times daily as needed for cough or to loosen phlegm. (Patient taking differently: Take 600 mg by mouth 2 (two) times daily.)  ? Lactase (LACTOSE FAST ACTING RELIEF PO) Take 3 tablets by mouth in the morning, at noon, and at bedtime.  ? lidocaine-prilocaine (EMLA) cream Apply  topically.  ? magnesium oxide (MAG-OX) 400 MG tablet Take 400 mg by mouth every evening.  ? mirtazapine (REMERON SOL-TAB) 30 MG disintegrating tablet Take by mouth.  ? montelukast (SINGULAIR) 10 MG tablet Take 1 tab

## 2021-05-04 ENCOUNTER — Ambulatory Visit
Admission: RE | Admit: 2021-05-04 | Discharge: 2021-05-04 | Disposition: A | Discharge status: Home / Self Care | Payer: Medicare PPO | Source: Ambulatory Visit | Attending: Internal Medicine | Admitting: Internal Medicine

## 2021-05-04 DIAGNOSIS — R140 Abdominal distension (gaseous): Secondary | ICD-10-CM | POA: Diagnosis not present

## 2021-05-04 DIAGNOSIS — Z9049 Acquired absence of other specified parts of digestive tract: Secondary | ICD-10-CM | POA: Diagnosis not present

## 2021-05-10 ENCOUNTER — Other Ambulatory Visit: Payer: Self-pay | Admitting: Internal Medicine

## 2021-05-10 DIAGNOSIS — R21 Rash and other nonspecific skin eruption: Secondary | ICD-10-CM

## 2021-05-10 IMAGING — CT CT CHEST HIGH RESOLUTION W/O CM
2 of 5 series · 15 of 36 positions shown, 18 images · non-contrast
Comparison: 02/10/2018 chest CT.

CLINICAL DATA: Rheumatoid arthritis. Cough and shortness of breath.
History of breast cancer.

EXAM:
CT CHEST WITHOUT CONTRAST
TECHNIQUE: Multidetector CT imaging of the chest was performed following the
standard protocol without intravenous contrast. High resolution
imaging of the lungs, as well as inspiratory and expiratory imaging,
was performed.

[Series 4: high resolution · axial · 0.60mm/px · z∈[-271,-27]mm · 12 of 136 slices shown, 15 images]
[im 7/136  mediastinal]
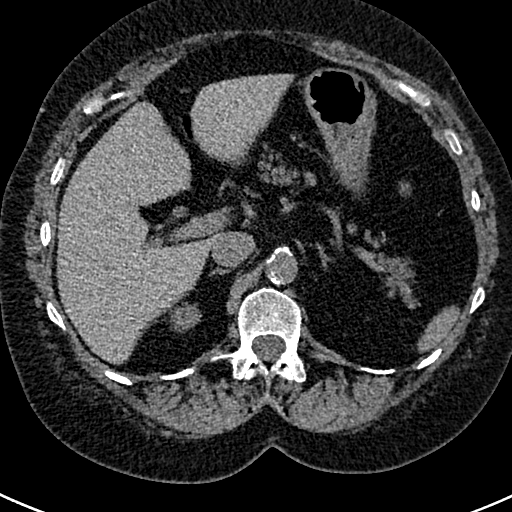
[im 7/136  lung]
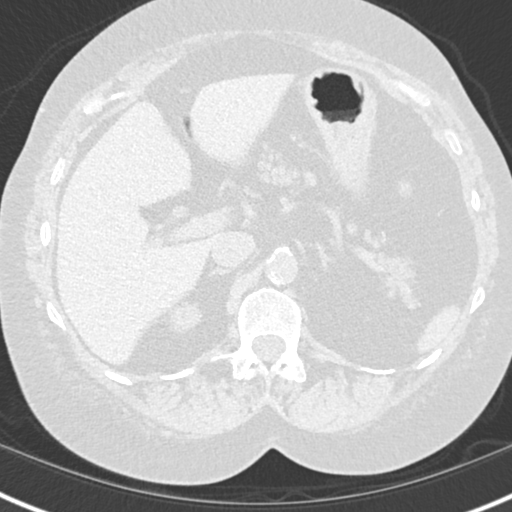
[im 19/136  lung]
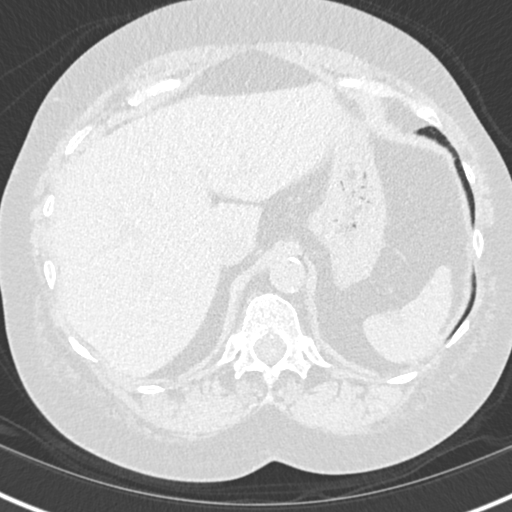
[im 31/136  lung]
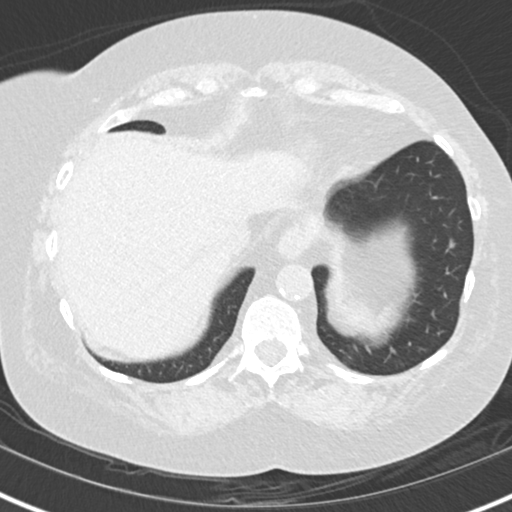
[im 43/136  lung]
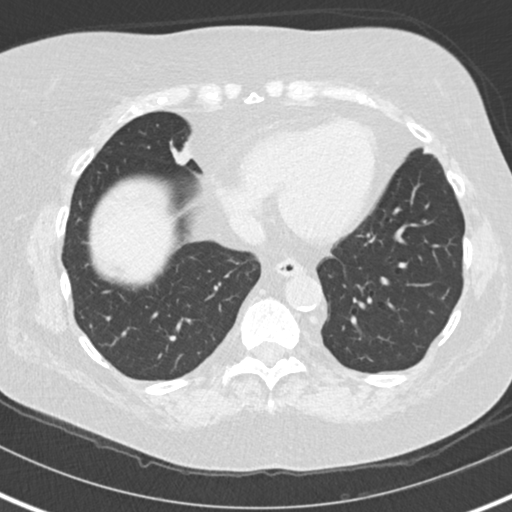
[im 50/136  mediastinal]
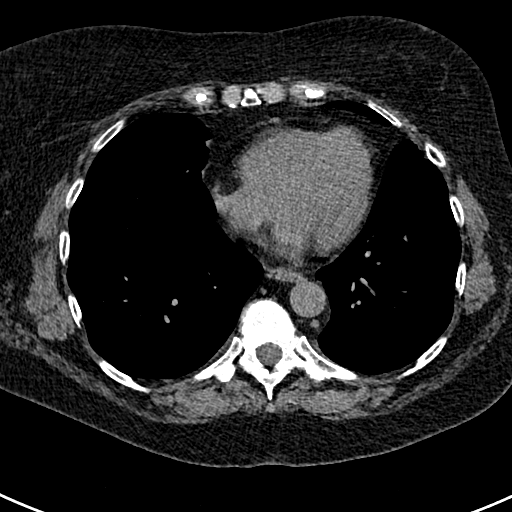
[im 50/136  lung]
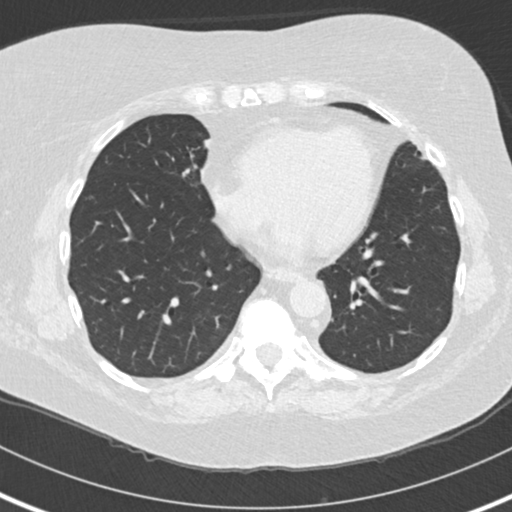
[im 62/136  lung]
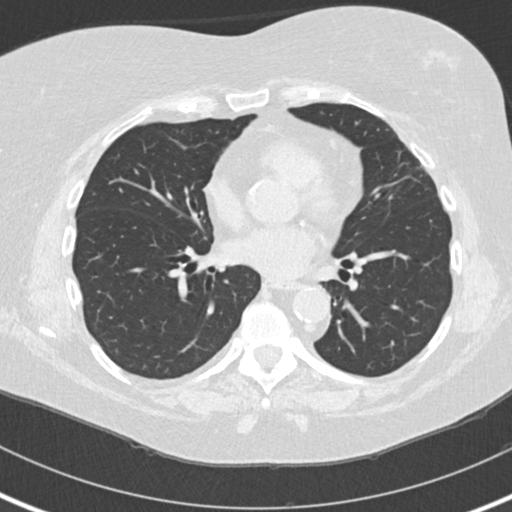
[im 74/136  lung]
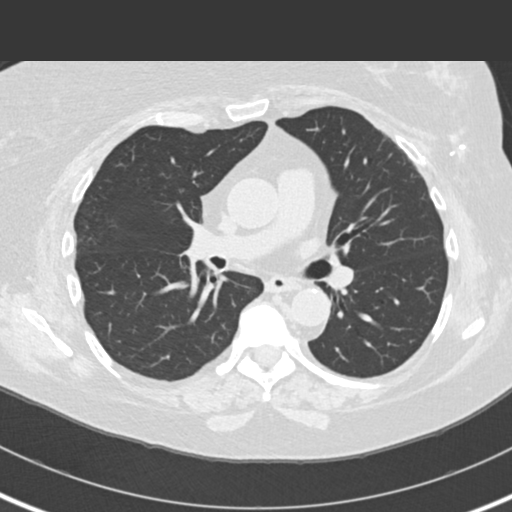
[im 86/136  lung]
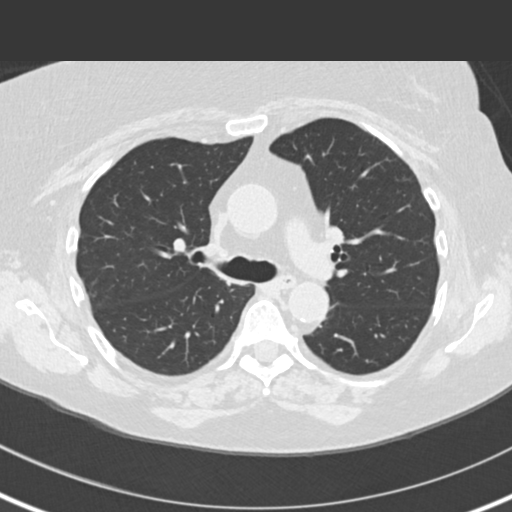
[im 93/136  mediastinal]
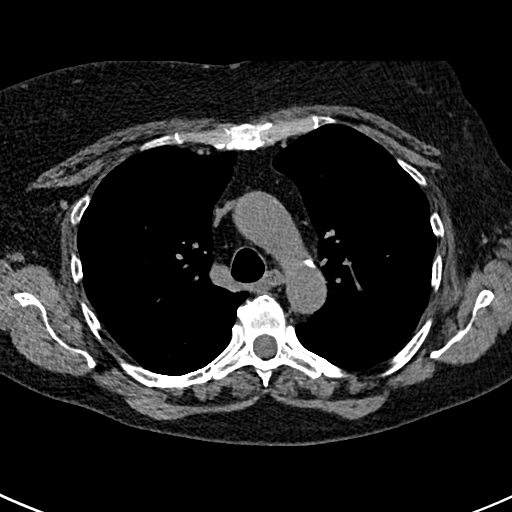
[im 93/136  lung]
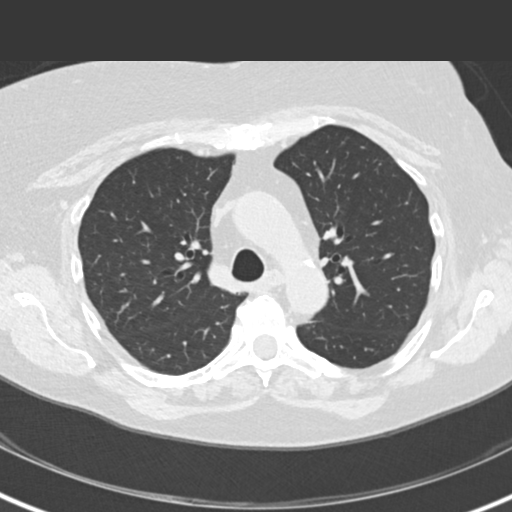
[im 105/136  lung]
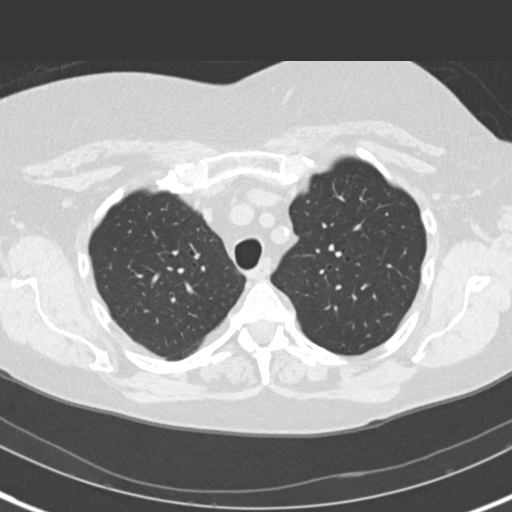
[im 117/136  lung]
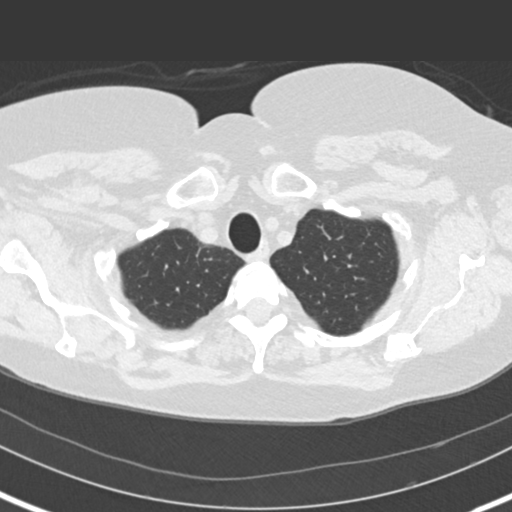
[im 129/136  lung]
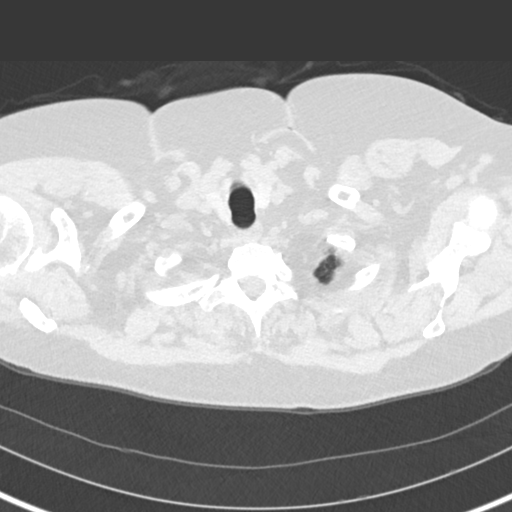

[Series 9: coronal · coronal · 0.56mm/px · 3 of 128 slices shown]
[im 26/128  lung]
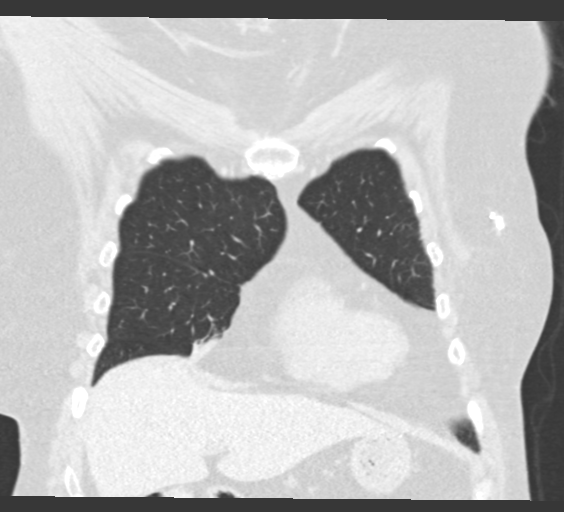
[im 51/128  lung]
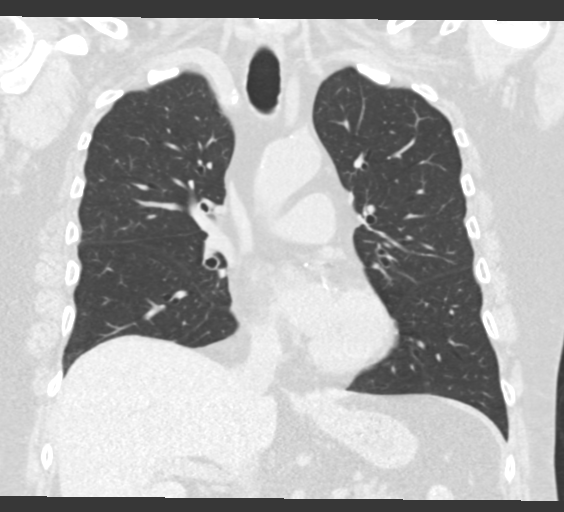
[im 77/128  lung]
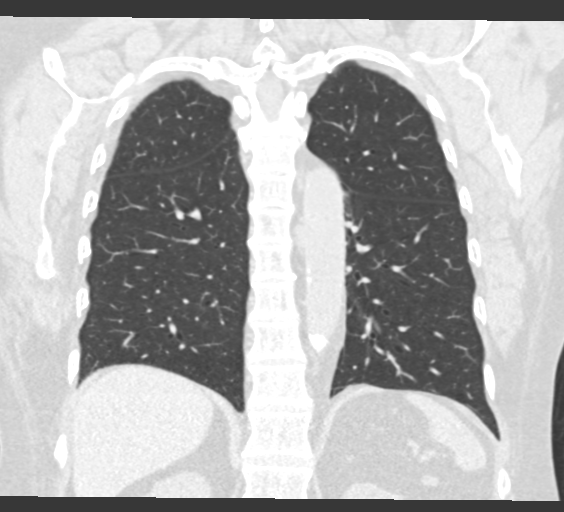

[15 of 36 positions shown; findings below may reference images not displayed]

FINDINGS: Cardiovascular: Normal heart size. No significant pericardial
effusion/thickening. Three-vessel coronary atherosclerosis.
Atherosclerotic nonaneurysmal thoracic aorta. Normal caliber
pulmonary arteries.

Mediastinum/Nodes: No discrete thyroid nodules. Unremarkable
esophagus. No pathologically enlarged axillary, mediastinal or hilar
lymph nodes, noting limited sensitivity for the detection of hilar
adenopathy on this noncontrast study.

Lungs/Pleura: No pneumothorax. No pleural effusion. No acute
consolidative airspace disease or lung masses. 2 scattered tiny
solid pulmonary nodules, largest 5 mm in the peripheral basilar left
lower lobe (series 5/image 107), both stable, considered benign. No
new significant pulmonary nodules. Mild cylindrical bronchiectasis
in the medial segment right middle lobe and inferior segment lingula
with associated small parenchymal bands, not appreciably changed. No
significant regions of subpleural reticulation, ground-glass
attenuation, architectural distortion or frank honeycombing. Mild
patchy air trapping in both lungs on the expiration sequence.
Findings are suggestive of tracheobronchomalacia on the expiration
sequence (series 3/image 5).

Upper abdomen: Cholecystectomy.

Musculoskeletal: No aggressive appearing focal osseous lesions. Mild
thoracic spondylosis.
IMPRESSION: 1. Evidence of tracheobronchomalacia on inspiratory and expiratory
imaging.
2. Evidence of mild patchy air trapping in both lungs on expiratory
imaging, indicative of small airways disease.
3. Chronic mild cylindrical bronchiectasis in the right middle lobe
and lingula, unchanged. Associated small parenchymal bands at the
areas of bronchiectasis, compatible with mild
postinfectious/postinflammatory scarring.
4. No evidence of interstitial lung disease.
5. Three-vessel coronary atherosclerosis.

Aortic Atherosclerosis ([id number]).

## 2021-05-11 ENCOUNTER — Telehealth: Payer: Self-pay

## 2021-05-11 ENCOUNTER — Other Ambulatory Visit: Payer: Self-pay

## 2021-05-11 ENCOUNTER — Other Ambulatory Visit: Payer: Self-pay | Admitting: Internal Medicine

## 2021-05-11 DIAGNOSIS — R21 Rash and other nonspecific skin eruption: Secondary | ICD-10-CM

## 2021-05-11 NOTE — Telephone Encounter (Signed)
Next Visit: 08/09/2021 ?  ?Last Visit: 04/28/2021 ?  ?Last Fill: 05/03/2021 ?  ?Dx: Rash and other nonspecific skin eruption  ?  ?Current Dose per office note on 04/28/2021: triamcinolone ointment (KENALOG) 0.1 % ?  ?Okay to refill Kenalog Cream?   ?

## 2021-05-11 NOTE — Telephone Encounter (Signed)
Patient called requesting prescription refill of Triamcinolone ointment to be sent to [facility].  Patient states her last prescription which was filled on 05/03/21 she received two very small jars which doesn't last very long since she uses it on her neck to her buttocks.  Patient is requesting a larger jar. ?

## 2021-05-11 NOTE — Telephone Encounter (Signed)
Patient called to see if her Triamcinolone ointment could be called in this afternoon.  Patient states she is out of medication and has been using hairspray to stop the itching and is nervous since she has open sores.   ?

## 2021-05-12 ENCOUNTER — Telehealth: Payer: Self-pay | Admitting: Family Medicine

## 2021-05-12 ENCOUNTER — Other Ambulatory Visit: Payer: Self-pay | Admitting: Family Medicine

## 2021-05-12 ENCOUNTER — Telehealth: Payer: Self-pay | Admitting: Urology

## 2021-05-12 MED ORDER — TRIAMCINOLONE ACETONIDE 0.1 % EX OINT: TOPICAL_OINTMENT | Freq: Two times a day (BID) | CUTANEOUS | 0 refills | Status: DC

## 2021-05-12 NOTE — Telephone Encounter (Signed)
Spoke with patient and advised her we do not require her to have pre-op visit, unless her surgeon recommends one. Patient states surgeon has not recommended OV prior to surgery. Patient understands no OV required at this time. Nothing further needed at this time.  ? ?

## 2021-05-12 NOTE — Telephone Encounter (Signed)
Spoke with patient and advised prescription has been sent to the pharmacy.  ?

## 2021-05-12 NOTE — Telephone Encounter (Signed)
DOS  - 06/05/21 ? ?HAMMERTOE REPAIR 2ND RIGHT --- 98338 ?CAPSULOTOMY, MPJ RELEASE 2ND RIGHT --- 28270 ?EXCISION OF SOFT TISSUE MASS LEFT --- 25053 ?  ?[employer] EFFECTIVE DATE - 02/20/19 ?  ?  ?PER [facility] [employer] CPT CODES 97673 AND 41937 NO PRIOR AUTH IS REQUIRED. FOR CPT CODE 90240 HAS BEEN APPROVED, AUTH # [id number], GOOD FROM 06/05/2021 - 09/03/2021 ?  ?TRACKING # [id number] ?

## 2021-05-12 NOTE — Telephone Encounter (Signed)
Patient having surgery on both feet 06/05/21; called to ask if preop appointment needed with pcp. Please advise at [phone].  ?

## 2021-05-12 NOTE — Telephone Encounter (Signed)
Sorry about not getting to it earlier, Rx sent now.

## 2021-05-18 ENCOUNTER — Telehealth: Payer: Self-pay | Admitting: Allergy and Immunology

## 2021-05-18 ENCOUNTER — Other Ambulatory Visit: Payer: Self-pay | Admitting: *Deleted

## 2021-05-18 MED ORDER — CYPROHEPTADINE HCL 4 MG PO TABS: ORAL_TABLET | ORAL | 5 refills | Status: DC

## 2021-05-18 NOTE — Telephone Encounter (Signed)
Patient scheduled for upcoming surgery with podiatry: ?HAMMER TOE REPAIR 2ND TOE RIGHT FOOT Right   ?CAPSULOTOMY Right   ?EXCISION OF SOFT TISSUE  MASS LEFT FOOT    ? ? ? ?Patient is asking if she needs to hold Xarelto prior to surgery, please advise. Thanks! ?

## 2021-05-18 NOTE — Addendum Note (Signed)
Addended by: Wadie Lessen on: 05/18/2021 09:31 AM ? ? Modules accepted: Orders ? ?

## 2021-05-18 NOTE — Telephone Encounter (Signed)
I looked into the patients chart and did not see mention of the Mirtazapine or a prescription ever being sent in by [place]. I called the patient and she stated that she thought she had discussed this with you at one point and you recommended to take the medication at bedtime for when she is having trouble sleeping. Does this sound familiar?  ?

## 2021-05-18 NOTE — Telephone Encounter (Signed)
Spoke with patient and advised. She voiced understanding. Nothing further needed at this time.  ? ?

## 2021-05-18 NOTE — Telephone Encounter (Signed)
Patient called and needs to ask a question about mirtazapine. When she suppose to take it . [phone]  ?

## 2021-05-18 NOTE — Telephone Encounter (Signed)
Called and spoke with patient and advised of Dr. Bruna Potter recommendation. I did advise that maybe her PCP dispensed the Mirtazapine and to call and check with them. Patient verbalized understanding. I sent in refills for the Periactin for the patient, patient verbalized understanding.  ?

## 2021-05-22 ENCOUNTER — Encounter: Payer: Self-pay | Admitting: Family Medicine

## 2021-05-23 ENCOUNTER — Other Ambulatory Visit: Payer: Self-pay | Admitting: Physician Assistant

## 2021-05-23 ENCOUNTER — Ambulatory Visit: Payer: Medicare PPO | Admitting: Physician Assistant

## 2021-05-23 ENCOUNTER — Other Ambulatory Visit: Payer: Medicare PPO

## 2021-05-23 DIAGNOSIS — D509 Iron deficiency anemia, unspecified: Secondary | ICD-10-CM

## 2021-05-24 ENCOUNTER — Encounter: Payer: Medicare PPO | Admitting: Podiatry

## 2021-05-24 ENCOUNTER — Inpatient Hospital Stay: Payer: Medicare PPO | Attending: Physician Assistant

## 2021-05-24 ENCOUNTER — Other Ambulatory Visit: Payer: Self-pay

## 2021-05-24 ENCOUNTER — Inpatient Hospital Stay: Payer: Medicare PPO | Admitting: Physician Assistant

## 2021-05-24 VITALS — BP 131/59 | HR 83 | Temp 97.9°F | Resp 18 | Wt 131.6 lb

## 2021-05-24 DIAGNOSIS — Z86718 Personal history of other venous thrombosis and embolism: Secondary | ICD-10-CM | POA: Insufficient documentation

## 2021-05-24 DIAGNOSIS — R5383 Other fatigue: Secondary | ICD-10-CM | POA: Insufficient documentation

## 2021-05-24 DIAGNOSIS — D509 Iron deficiency anemia, unspecified: Secondary | ICD-10-CM

## 2021-05-24 DIAGNOSIS — Z803 Family history of malignant neoplasm of breast: Secondary | ICD-10-CM | POA: Diagnosis not present

## 2021-05-24 DIAGNOSIS — Z7901 Long term (current) use of anticoagulants: Secondary | ICD-10-CM | POA: Insufficient documentation

## 2021-05-24 DIAGNOSIS — M069 Rheumatoid arthritis, unspecified: Secondary | ICD-10-CM | POA: Insufficient documentation

## 2021-05-24 DIAGNOSIS — M129 Arthropathy, unspecified: Secondary | ICD-10-CM | POA: Diagnosis not present

## 2021-05-24 DIAGNOSIS — D500 Iron deficiency anemia secondary to blood loss (chronic): Secondary | ICD-10-CM | POA: Diagnosis not present

## 2021-05-24 DIAGNOSIS — Z79899 Other long term (current) drug therapy: Secondary | ICD-10-CM | POA: Insufficient documentation

## 2021-05-24 DIAGNOSIS — Z86711 Personal history of pulmonary embolism: Secondary | ICD-10-CM | POA: Insufficient documentation

## 2021-05-24 DIAGNOSIS — D801 Nonfamilial hypogammaglobulinemia: Secondary | ICD-10-CM | POA: Insufficient documentation

## 2021-05-24 DIAGNOSIS — J45909 Unspecified asthma, uncomplicated: Secondary | ICD-10-CM | POA: Diagnosis not present

## 2021-05-24 DIAGNOSIS — M810 Age-related osteoporosis without current pathological fracture: Secondary | ICD-10-CM | POA: Diagnosis not present

## 2021-05-24 LAB — CBC WITH DIFFERENTIAL (CANCER CENTER ONLY)
Abs Immature Granulocytes: 0.05 10*3/uL (ref 0.00–0.07)
Basophils Absolute: 0.1 10*3/uL (ref 0.0–0.1)
Basophils Relative: 1 %
Eosinophils Absolute: 0.3 10*3/uL (ref 0.0–0.5)
Eosinophils Relative: 3 %
HCT: 37.1 % (ref 36.0–46.0)
Hemoglobin: 11.9 g/dL — ABNORMAL LOW (ref 12.0–15.0)
Immature Granulocytes: 0 %
Lymphocytes Relative: 5 %
Lymphs Abs: 0.6 10*3/uL — ABNORMAL LOW (ref 0.7–4.0)
MCH: 34.4 pg — ABNORMAL HIGH (ref 26.0–34.0)
MCHC: 32.1 g/dL (ref 30.0–36.0)
MCV: 107.2 fL — ABNORMAL HIGH (ref 80.0–100.0)
Monocytes Absolute: 1 10*3/uL (ref 0.1–1.0)
Monocytes Relative: 8 %
Neutro Abs: 10.3 10*3/uL — ABNORMAL HIGH (ref 1.7–7.7)
Neutrophils Relative %: 83 %
Platelet Count: 196 10*3/uL (ref 150–400)
RBC: 3.46 MIL/uL — ABNORMAL LOW (ref 3.87–5.11)
RDW: 20 % — ABNORMAL HIGH (ref 11.5–15.5)
WBC Count: 12.3 10*3/uL — ABNORMAL HIGH (ref 4.0–10.5)
nRBC: 0 % (ref 0.0–0.2)

## 2021-05-24 LAB — CMP (CANCER CENTER ONLY)
ALT: 87 U/L — ABNORMAL HIGH (ref 0–44)
AST: 70 U/L — ABNORMAL HIGH (ref 15–41)
Albumin: 3.5 g/dL (ref 3.5–5.0)
Alkaline Phosphatase: 76 U/L (ref 38–126)
Anion gap: 11 (ref 5–15)
BUN: 33 mg/dL — ABNORMAL HIGH (ref 8–23)
CO2: 25 mmol/L (ref 22–32)
Calcium: 9.3 mg/dL (ref 8.9–10.3)
Chloride: 104 mmol/L (ref 98–111)
Creatinine: 1.44 mg/dL — ABNORMAL HIGH (ref 0.44–1.00)
GFR, Estimated: 38 mL/min — ABNORMAL LOW (ref >60)
Glucose, Bld: 196 mg/dL — ABNORMAL HIGH (ref 70–99)
Potassium: 4.4 mmol/L (ref 3.5–5.1)
Sodium: 140 mmol/L (ref 135–145)
Total Bilirubin: 0.4 mg/dL (ref 0.3–1.2)
Total Protein: 7.2 g/dL (ref 6.5–8.1)

## 2021-05-24 LAB — IRON AND IRON BINDING CAPACITY (CC-WL,HP ONLY)
Iron: 78 ug/dL (ref 28–170)
Saturation Ratios: 23 % (ref 10.4–31.8)
TIBC: 337 ug/dL (ref 250–450)
UIBC: 259 ug/dL

## 2021-05-24 NOTE — Progress Notes (Signed)
?[place] ?[phone]   [phone] ? ?PROGRESS NOTE ? ?Patient Care Team: ?[name], MD as PCP - General (Family Medicine) ?Buford Dresser, MD as PCP - Cardiology (Cardiology) ? ?Hematological/Oncological History ?#Iron deficiency anemia: ?1) 02/04/2021-02/07/2021: Admitted for iron deficiency anemia after presenting with Hgb of 6.5.  ?Patient underwent EGD 12/18 which revealed gastric antral vascular ectasia, treated with APC. Aspirin/plavix/xarelto held during hospitalization. Patient was given 1 unit of PRBC and IV venofer 200 mg x 1. Discussed with cardiology with recommendations to stop aspirin but patient should continue on Plavix. Xarelto was also resumed due to history of recurrent DVT/PE.  ? ?2) 02/24/2021: Hgb 8.3, MCV 98.2, Plt 404K, Iron 14, Iron Saturation 3.4%, Ferritin 74.6. ? ?3) 03/14/2021: Establish care with [facility] Hematology/Oncology ? ?4) 03/15/2021-04/12/2021: Received IV venofer 200 mg once weekly x 5 doses.  ? ?CHIEF COMPLAINTS/PURPOSE OF CONSULTATION:  ?"Iron deficiency anemia " ? ?HISTORY OF PRESENTING ILLNESS:  ?Ebony Scott 71 y.o. female returns for a follow up for iron deficiency anemia. She is accompanied by her husband for this visit.  ? ?At today's visit, Ebony Scott reports mild improvement of energy levels since receiving IV iron infusions. She continues to feel extremely fatigued which affects her quality of life. She denies any appetite changes or weight loss. She denies nausea, vomiting or abdominal pain. Her bowel habits are unchanged without any recurrent episodes of diarrhea or constipation. She has chronic shortness of breath with exertion. She denies fevers, chills, sweats, chest pain or cough. She has no other complaints. Rest of the 10 point ROS is below.  ? ? ?MEDICAL HISTORY:  ?Past Medical History:  ?Diagnosis Date  ? Abscess of dorsum of right hand 09/22/2020  ? Acute deep vein thrombosis (DVT) of right lower extremity  ([place]) 12/13/2017  ? Allergic rhinitis   ? Anemia   ? Angio-edema   ? Anxiety   ? pt denies  ? Arthritis   ? Phreesia 10/21/2019  ? Asthma   ? Phreesia 10/21/2019  ? Breast cancer ([place]) 1998  ? in remission  ? Cataract   ? REMOVED  ? CHF (congestive heart failure) ([place])   ? Clostridium difficile colitis 01/14/2018  ? Clostridium difficile diarrhea 01/14/2018  ? Complication of anesthesia   ? "had hard time waking up from it several times" (02/20/2012)  ? Depression   ? "some; don't take anything for it" (02/20/2012)  ? Diverticulosis   ? DVT (deep venous thrombosis) ([place])   ? Fibromyalgia 11/2011  ? GERD (gastroesophageal reflux disease)   ? Graves disease   ? Headache(784.0)   ? "related to allergies; more at different times during the year" (02/20/2012)  ? Hemorrhoids   ? Hiatal hernia   ? back and neck  ? Hx of adenomatous colonic polyps 04/12/2016  ? Hypercholesteremia   ? good cholesterol is high  ? Hypothyroidism   ? IBS (irritable bowel syndrome)   ? Moderate persistent asthma   ? -FeV1 72% 2011, -IgE 102 2011, CT sinus Neg 2011  ? Osteoporosis   ? on reclast yearly  ? Peripheral vascular disease ([place]) 2019  ? DVTs  ? Pneumonia 04/2011; ~ 11/2011  ? "double; single" (02/20/2012)  ? Septic olecranon bursitis of right elbow 09/22/2020  ? Seronegative rheumatoid arthritis ([place])   ? Dr. Lahoma Rocker  ? SIRS (systemic inflammatory response syndrome) ([place]) 02/10/2018  ? Tracheobronchomalacia   ? ? ?SURGICAL HISTORY: ?Past Surgical History:  ?Procedure Laterality Date  ?  ABDOMINAL HYSTERECTOMY N/A   ? Phreesia 10/21/2019  ? ANTERIOR AND POSTERIOR REPAIR  1990's  ? APPENDECTOMY    ? ARGON LASER APPLICATION  27/04/5007  ? Procedure: ARGON LASER APPLICATION;  Surgeon: Yetta Flock, MD;  Location: [facility];  Service: Gastroenterology;;  ? BREAST LUMPECTOMY  1998  ? left  ? BREAST SURGERY N/A   ? Phreesia 10/21/2019  ? BRONCHIAL WASHINGS  04/05/2020  ? Procedure: BRONCHIAL WASHINGS;  Surgeon: Freddi Starr, MD;   Location: [facility];  Service: Pulmonary;;  ? CARPOMETACARPEL ([place]) FUSION OF THUMB WITH AUTOGRAFT FROM RADIUS  ~ 2009  ? "both thumbs" (02/20/2012)  ? CATARACT EXTRACTION W/ INTRAOCULAR LENS  IMPLANT, BILATERAL  2012  ? CERVICAL DISCECTOMY  10/2001  ? C5-C6  ? CERVICAL FUSION  2003  ? C3-C4  ? CHOLECYSTECTOMY    ? COLONOSCOPY    ? CORONARY STENT INTERVENTION N/A 01/11/2021  ? Procedure: CORONARY STENT INTERVENTION;  Surgeon: Leonie Man, MD;  Location: [facility];  Service: Cardiovascular;  Laterality: N/A;  ? DEBRIDEMENT TENNIS ELBOW  ?1970's  ? right  ? ESOPHAGOGASTRODUODENOSCOPY    ? ESOPHAGOGASTRODUODENOSCOPY (EGD) WITH PROPOFOL N/A 02/05/2021  ? Procedure: ESOPHAGOGASTRODUODENOSCOPY (EGD) WITH PROPOFOL;  Surgeon: Yetta Flock, MD;  Location: [facility];  Service: Gastroenterology;  Laterality: N/A;  ? EYE SURGERY N/A   ? Phreesia 10/21/2019  ? HYSTERECTOMY    ? I & D EXTREMITY Right 09/22/2020  ? Procedure: IRRIGATION AND DEBRIDEMENT RIGHT HAND AND ELBOW;  Surgeon: Marchia Bond, MD;  Location: [facility];  Service: Orthopedics;  Laterality: Right;  ? KNEE ARTHROPLASTY  ?1990's  ? "?right; w/cartilage repair" (02/20/2012)  ? NASAL SEPTUM SURGERY  1980's  ? POSTERIOR CERVICAL FUSION/FORAMINOTOMY  2004  ? "failed initial fusion; rewired  anterior neck" (02/20/2012)  ? REVERSE SHOULDER ARTHROPLASTY Right 02/16/2020  ? Procedure: REVERSE SHOULDER ARTHROPLASTY;  Surgeon: Marchia Bond, MD;  Location: [facility];  Service: Orthopedics;  Laterality: Right;  ? RIGHT/LEFT HEART CATH AND CORONARY ANGIOGRAPHY N/A 01/11/2021  ? Procedure: RIGHT/LEFT HEART CATH AND CORONARY ANGIOGRAPHY;  Surgeon: Leonie Man, MD;  Location: [facility];  Service: Cardiovascular;  Laterality: N/A;  ? SPINE SURGERY N/A   ? Phreesia 10/21/2019  ? TONSILLECTOMY  ~ 1953  ? VESICOVAGINAL FISTULA CLOSURE W/ TAH  1988  ? VIDEO BRONCHOSCOPY Bilateral 08/23/2016  ? Procedure: VIDEO BRONCHOSCOPY WITH FLUORO;  Surgeon: Javier Glazier, MD;  Location: [facility];  Service: Cardiopulmonary;  Laterality: Bilateral;  ? VIDEO BRONCHOSCOPY N/A 04/05/2020  ? Procedure: VIDEO BRONCHOSCOPY WITHOUT FLUORO;  Surgeon: Freddi Starr, MD;  Location: [facility];  Service: Pulmonary;  Laterality: N/A;  ? ? ?SOCIAL HISTORY: ?Social History  ? ?Socioeconomic History  ? Marital status: Divorced  ?  Spouse name: Not on file  ? Number of children: 2  ? Years of education: college  ? Highest education level: Not on file  ?Occupational History  ? Occupation: Disabled  ?  Comment: Retired Print production planner  ?  Employer: RETIRED  ?Tobacco Use  ? Smoking status: Never  ?  Passive exposure: Yes  ? Smokeless tobacco: Never  ? Tobacco comments:  ?  Parents  ?Vaping Use  ? Vaping Use: Never used  ?Substance and Sexual Activity  ? Alcohol use: Not Currently  ?  Alcohol/week: 0.0 standard drinks  ? Drug use: No  ? Sexual activity: Never  ?Other Topics Concern  ? Not on file  ?Social  History Narrative  ? Patient lives at home alone. Patient  divorced.   ? Patient has her BS degree.  ? Right handed.  ? Caffeine- sometimes coffee.  ?   ? [facility]:  ? Born in [place], [place]. She worked as a Copywriter, advertising. She has no pets currently. She does have indoor plants. Previously had mold in her home that was remediated. Carpet was removed.   ?   ?   ? ?Social Determinants of Health  ? ?Financial Resource Strain: Low Risk   ? Difficulty of Paying Living Expenses: Not hard at all  ?Food Insecurity: No Food Insecurity  ? Worried About Charity fundraiser in the Last Year: Never true  ? Ran Out of Food in the Last Year: Never true  ?Transportation Needs: No Transportation Needs  ? Lack of Transportation (Medical): No  ? Lack of Transportation (Non-Medical): No  ?Physical Activity: Inactive  ? Days of Exercise per Week: 0 days  ? Minutes of Exercise per Session: 0 min  ?Stress: No Stress Concern Present  ? Feeling of Stress : Not at all  ?Social  Connections: Moderately Isolated  ? Frequency of Communication with Friends and Family: More than three times a week  ? Frequency of Social Gatherings with Friends and Family: More than three times a week  ? At

## 2021-05-25 ENCOUNTER — Ambulatory Visit (HOSPITAL_COMMUNITY): Payer: Medicare PPO | Attending: Cardiovascular Disease

## 2021-05-25 ENCOUNTER — Telehealth: Payer: Self-pay

## 2021-05-25 DIAGNOSIS — I5022 Chronic systolic (congestive) heart failure: Secondary | ICD-10-CM | POA: Insufficient documentation

## 2021-05-25 LAB — ECHOCARDIOGRAM COMPLETE
Area-P 1/2: 3.65 cm2
Calc EF: 42.2 %
S' Lateral: 3.9 cm
Single Plane A2C EF: 45.8 %
Single Plane A4C EF: 36.3 %

## 2021-05-25 LAB — TSH: TSH: 1.161 u[IU]/mL (ref 0.308–3.960)

## 2021-05-25 LAB — FERRITIN: Ferritin: 458 ng/mL — ABNORMAL HIGH (ref 11–307)

## 2021-05-25 NOTE — Telephone Encounter (Signed)
-----   Message from Lincoln Brigham, PA-C sent at 05/25/2021 12:54 PM EDT ----- ?Please notify patient that her iron levels have improved and do not require additional IV iron at this time. We were only able to add TSH levels which was normal. Please advise to follow up with PCP to further evaluate underlying cause of fatigue she ? is experiencing. We will see her back in 3 months with lab.  ?

## 2021-05-25 NOTE — Telephone Encounter (Signed)
Pt advised and verbalized understanding.

## 2021-05-26 ENCOUNTER — Telehealth (HOSPITAL_BASED_OUTPATIENT_CLINIC_OR_DEPARTMENT_OTHER): Payer: Self-pay

## 2021-05-26 NOTE — Telephone Encounter (Addendum)
Results called to patient who verbalizes understanding!  ? ? ? ? ? ?----- Message from Loel Dubonnet, NP sent at 05/25/2021  1:30 PM EDT ----- ?Echo shows heart pumping function 30-35%. Similar wall motional abnormalities. Mild leaking of mitral valve. Heart pumping function similar compared to previous. Please continue to weigh daily and keep log. Additional medication have been limited by kidney function and BP. Follow up as scheduled with Dr. Harrell Gave to discuss further medication management.  ?

## 2021-05-27 ENCOUNTER — Encounter: Payer: Self-pay | Admitting: Physician Assistant

## 2021-05-29 ENCOUNTER — Telehealth: Payer: Self-pay | Admitting: Physician Assistant

## 2021-05-29 NOTE — Telephone Encounter (Signed)
Scheduled per 4/5 los, pt has been called and confirmed ?

## 2021-05-30 ENCOUNTER — Telehealth: Payer: Self-pay | Admitting: Internal Medicine

## 2021-05-30 ENCOUNTER — Encounter: Payer: Self-pay | Admitting: Physician Assistant

## 2021-05-30 ENCOUNTER — Ambulatory Visit: Payer: Medicare PPO | Admitting: Family Medicine

## 2021-05-30 VITALS — BP 146/82 | HR 77 | Temp 97.2°F | Ht 61.0 in | Wt 128.4 lb

## 2021-05-30 DIAGNOSIS — G4700 Insomnia, unspecified: Secondary | ICD-10-CM

## 2021-05-30 DIAGNOSIS — R5382 Chronic fatigue, unspecified: Secondary | ICD-10-CM | POA: Diagnosis not present

## 2021-05-30 MED ORDER — NEOMYCIN-POLYMYXIN-HC 3.5-10000-1 OT SOLN: 3.0000 [drp] | Freq: Four times a day (QID) | OTIC | 0 refills | Status: DC

## 2021-05-30 MED ORDER — LORAZEPAM 0.5 MG PO TABS: 0.5000 mg | ORAL_TABLET | Freq: Two times a day (BID) | ORAL | 1 refills | Status: DC | PRN

## 2021-05-30 NOTE — Progress Notes (Addendum)
? ?Subjective:  ? ? Patient ID: Ebony Scott, female    DOB: [birth year], 71 y.o.   MRN: [id number] ? ?HPI ? ?Patient was recommended to follow-up with me regarding fatigue.  I reviewed her most recent lab work which shows elevations of her liver function test which have been chronic and I assume secondary to her atorvastatin as they have improved and we have stopped her statin in the past, a CBC which showed a mild elevation in her white blood cell count although she is on chronic prednisone, a normal TSH.  Patient states that she is concerned because of how fatigued she feels.  However I discussed the situation with the patient.  She is 71 years old.  She has congestive heart failure with a suppressed ejection fraction.  She has severe asthma as well as frequent exacerbations, she has an autoimmune disease and has been classified as seronegative rheumatoid arthritis, she has a history of DVTs and pulmonary embolisms, diabetes, chronic kidney disease.  She has been admitted to the hospital several times in the last year with serious life-threatening illness.  I believe her fatigue is likely multifactorial and is related to the interplay of all of these conditions together.  Therefore I do not feel that there is a "easy fix" that would resolve the fatigue.  Instead we need to try to slowly chip away at the underlying causes to improve her stamina and her strength.  She does report that she is having a difficult time sleeping.  She states that she tosses and turns in bed at night and gets less than 4 hours.  She feels that this could be contributing to her fatigue.  She is no longer taking mirtazapine.  She has not tried taking any sleeping medication in the past.  She did have a severe reaction muscle laxer as a cause for the variant in the past.  Therefore we need to be extremely careful regarding centrally acting medication in this patient ?Past Medical History:  ?Diagnosis Date  ? Abscess of dorsum of right hand  09/22/2020  ? Acute deep vein thrombosis (DVT) of right lower extremity ([place]) 12/13/2017  ? Allergic rhinitis   ? Anemia   ? Angio-edema   ? Anxiety   ? pt denies  ? Arthritis   ? Phreesia 10/21/2019  ? Asthma   ? Phreesia 10/21/2019  ? Breast cancer ([place]) 1998  ? in remission  ? Cataract   ? REMOVED  ? CHF (congestive heart failure) ([place])   ? Clostridium difficile colitis 01/14/2018  ? Clostridium difficile diarrhea 01/14/2018  ? Complication of anesthesia   ? "had hard time waking up from it several times" (02/20/2012)  ? Depression   ? "some; don't take anything for it" (02/20/2012)  ? Diverticulosis   ? DVT (deep venous thrombosis) ([place])   ? Fibromyalgia 11/2011  ? GERD (gastroesophageal reflux disease)   ? Graves disease   ? Headache(784.0)   ? "related to allergies; more at different times during the year" (02/20/2012)  ? Hemorrhoids   ? Hiatal hernia   ? back and neck  ? Hx of adenomatous colonic polyps 04/12/2016  ? Hypercholesteremia   ? good cholesterol is high  ? Hypothyroidism   ? IBS (irritable bowel syndrome)   ? Moderate persistent asthma   ? -FeV1 72% 2011, -IgE 102 2011, CT sinus Neg 2011  ? Osteoporosis   ? on reclast yearly  ? Peripheral vascular disease ([place]) 2019  ? DVTs  ?  Pneumonia 04/2011; ~ 11/2011  ? "double; single" (02/20/2012)  ? Septic olecranon bursitis of right elbow 09/22/2020  ? Seronegative rheumatoid arthritis ([place])   ? Dr. Lahoma Rocker  ? SIRS (systemic inflammatory response syndrome) ([place]) 02/10/2018  ? Tracheobronchomalacia   ? ?Past Surgical History:  ?Procedure Laterality Date  ? ABDOMINAL HYSTERECTOMY N/A   ? Phreesia 10/21/2019  ? ANTERIOR AND POSTERIOR REPAIR  1990's  ? APPENDECTOMY    ? ARGON LASER APPLICATION  65/46/5035  ? Procedure: ARGON LASER APPLICATION;  Surgeon: Yetta Flock, MD;  [facility];  Service: Gastroenterology;;  ? BREAST LUMPECTOMY  1998  ? left  ? BREAST SURGERY N/A   ? Phreesia 10/21/2019  ? BRONCHIAL WASHINGS  04/05/2020  ? Procedure:  BRONCHIAL WASHINGS;  Surgeon: Freddi Starr, MD;  Location: [facility];  Service: Pulmonary;;  ? CARPOMETACARPEL ([place]) FUSION OF THUMB WITH AUTOGRAFT FROM RADIUS  ~ 2009  ? "both thumbs" (02/20/2012)  ? CATARACT EXTRACTION W/ INTRAOCULAR LENS  IMPLANT, BILATERAL  2012  ? CERVICAL DISCECTOMY  10/2001  ? C5-C6  ? CERVICAL FUSION  2003  ? C3-C4  ? CHOLECYSTECTOMY    ? COLONOSCOPY    ? CORONARY STENT INTERVENTION N/A 01/11/2021  ? Procedure: CORONARY STENT INTERVENTION;  Surgeon: Leonie Man, MD;  Location: [place] [facility];  Service: Cardiovascular;  Laterality: N/A;  ? DEBRIDEMENT TENNIS ELBOW  ?1970's  ? right  ? ESOPHAGOGASTRODUODENOSCOPY    ? ESOPHAGOGASTRODUODENOSCOPY (EGD) WITH PROPOFOL N/A 02/05/2021  ? Procedure: ESOPHAGOGASTRODUODENOSCOPY (EGD) WITH PROPOFOL;  Surgeon: Yetta Flock, MD;  [facility];  Service: Gastroenterology;  Laterality: N/A;  ? EYE SURGERY N/A   ? Phreesia 10/21/2019  ? HYSTERECTOMY    ? I & D EXTREMITY Right 09/22/2020  ? Procedure: IRRIGATION AND DEBRIDEMENT RIGHT HAND AND ELBOW;  Surgeon: Marchia Bond, MD;  [facility];  Service: Orthopedics;  Laterality: Right;  ? KNEE ARTHROPLASTY  ?1990's  ? "?right; w/cartilage repair" (02/20/2012)  ? NASAL SEPTUM SURGERY  1980's  ? POSTERIOR CERVICAL FUSION/FORAMINOTOMY  2004  ? "failed initial fusion; rewired  anterior neck" (02/20/2012)  ? REVERSE SHOULDER ARTHROPLASTY Right 02/16/2020  ? Procedure: REVERSE SHOULDER ARTHROPLASTY;  Surgeon: Marchia Bond, MD;  [facility];  Service: Orthopedics;  Laterality: Right;  ? RIGHT/LEFT HEART CATH AND CORONARY ANGIOGRAPHY N/A 01/11/2021  ? Procedure: RIGHT/LEFT HEART CATH AND CORONARY ANGIOGRAPHY;  Surgeon: Leonie Man, MD;  Location: [place] [facility];  Service: Cardiovascular;  Laterality: N/A;  ? SPINE SURGERY N/A   ? Phreesia 10/21/2019  ? TONSILLECTOMY  ~ 1953  ? VESICOVAGINAL FISTULA CLOSURE W/ TAH  1988  ? VIDEO BRONCHOSCOPY Bilateral 08/23/2016  ?  Procedure: VIDEO BRONCHOSCOPY WITH FLUORO;  Surgeon: Javier Glazier, MD;  Location: [facility];  Service: Cardiopulmonary;  Laterality: Bilateral;  ? VIDEO BRONCHOSCOPY N/A 04/05/2020  ? Procedure: VIDEO BRONCHOSCOPY WITHOUT FLUORO;  Surgeon: Freddi Starr, MD;  Location: [facility];  Service: Pulmonary;  Laterality: N/A;  ? ?Current Outpatient Medications on File Prior to Visit  ?Medication Sig Dispense Refill  ? acetaminophen (TYLENOL) 650 MG CR tablet Take 1,300 mg by mouth every 8 (eight) hours as needed for pain.    ? albuterol (PROVENTIL) (2.5 MG/3ML) 0.083% nebulizer solution Take 3 mLs (2.5 mg total) by nebulization every 4 (four) hours as needed for wheezing or shortness of breath. 75 mL 12  ? Alpha-D-Galactosidase (BEANO PO) Take 2 tablets by mouth 3 (three) times daily with meals.    ?  Alpha-Lipoic Acid 600 MG TABS Take 600 mg by mouth daily.    ? atorvastatin (LIPITOR) 80 MG tablet Take 1 tablet (80 mg total) by mouth daily. 90 tablet 3  ? azelastine (ASTELIN) 0.1 % nasal spray Use in each nostril as directed (Patient taking differently: 2 sprays daily as needed for allergies.) 30 mL 5  ? budesonide (PULMICORT) 0.5 MG/2ML nebulizer solution Take 2 mLs (0.5 mg total) by nebulization 2 (two) times daily as needed. 120 mL 12  ? CALCIUM-MAGNESIUM PO Take 1 tablet by mouth every evening.    ? cetirizine (ZYRTEC) 10 MG tablet Take 1 tablet (10 mg total) by mouth 2 (two) times daily as needed for allergies (Can take a n extra dose during flare ups.). 60 tablet 5  ? cholecalciferol (VITAMIN D) 25 MCG (1000 UNIT) tablet 1,000 Units every evening.    ? cyproheptadine (PERIACTIN) 4 MG tablet Take 2 tablets at bedtime as needed to treat facial pain or sleep dysfunction. 60 tablet 5  ? dexlansoprazole (DEXILANT) 60 MG capsule Take 1 capsule (60 mg total) by mouth daily. 90 capsule 3  ? diclofenac Sodium (VOLTAREN) 1 % GEL Apply 1 application topically 4 (four) times daily as needed (pain).    ?  empagliflozin (JARDIANCE) 10 MG TABS tablet Take 1 tablet (10 mg total) by mouth daily before breakfast. 30 tablet 5  ? EPINEPHrine 0.3 mg/0.3 mL IJ SOAJ injection USE AS DIRECTED BY YOUR PHYSICIAN INTRAMUSCULARLY AS NEEDED FO

## 2021-05-30 NOTE — Progress Notes (Signed)
Left voicemail message for Ebony Scott @ dr patel office   due to  ef 30 to 35 % per echo done 05-25-2021 patient does not meet [facility] guidelines and must be de [facility] at  [facility] or for 06-05-2021 surgery. ?

## 2021-05-30 NOTE — Telephone Encounter (Signed)
Patient called the office stating due to her insurance her IV infusion medication is changing to [place]. Patient states she cannot think of the name of her RA medication. Patient states she was supposed to inform Dr. Benjamine Mola of this change. ?

## 2021-05-31 NOTE — Telephone Encounter (Signed)
IVIG replacement change but currently just on oral prednisone for RA besides this so no modification needed.

## 2021-06-02 ENCOUNTER — Encounter (HOSPITAL_COMMUNITY): Payer: Self-pay | Admitting: Podiatry

## 2021-06-02 ENCOUNTER — Encounter (HOSPITAL_COMMUNITY): Payer: Self-pay | Admitting: Physician Assistant

## 2021-06-02 ENCOUNTER — Telehealth (HOSPITAL_BASED_OUTPATIENT_CLINIC_OR_DEPARTMENT_OTHER): Payer: Self-pay | Admitting: Family

## 2021-06-02 ENCOUNTER — Other Ambulatory Visit: Payer: Self-pay

## 2021-06-02 ENCOUNTER — Telehealth (HOSPITAL_BASED_OUTPATIENT_CLINIC_OR_DEPARTMENT_OTHER): Payer: Self-pay | Admitting: Cardiology

## 2021-06-02 MED ORDER — CLOPIDOGREL BISULFATE 75 MG PO TABS: 75.0000 mg | ORAL_TABLET | Freq: Every day | ORAL | 3 refills | Status: DC

## 2021-06-02 NOTE — Telephone Encounter (Signed)
Patient calling back to say that the refill has hasnt been seen. And she needs it today so her son can pick up before he leaves to go out of town Please advise. ?

## 2021-06-02 NOTE — Telephone Encounter (Signed)
Rx refilled.

## 2021-06-02 NOTE — Telephone Encounter (Signed)
Recommendations called to patient. Patient verbalizes understanding  ? ? ? ? ? ?"Please call patient to inform her to resume Plavix '75mg'$  QD for stent protection. She has appt 06/08/21 with Dr. Harrell Gave and they can discuss cardiac clearance for surgery. We generally recommend waiting at least 6 months after cardiac stent for elective surgery. " ?

## 2021-06-02 NOTE — Progress Notes (Signed)
TWO VISITORS ARE ALLOWED TO COME WITH YOU AND STAY IN THE SURGICAL WAITING ROOM ONLY DURING PRE OP AND PROCEDURE DAY OF SURGERY.  ? ?PCP - Dr Jenna Luo ?Cardiologist - Laurann Montana, NP ?Allergy & Asthma - Dr Allena Katz ?Gastroenterology - Dr Silvano Rusk ?Oncology - Dede Query, PA-C ?Pulmonology - Dr Freda Jackson ? ?Chest x-ray - 02/04/21 (1V) ?EKG - 02/04/21 ?Stress Test - 01/03/20 (failed but passed nuclear test) ?ECHO - 05/25/21 ?Cardiac Cath - 01/11/21 ? ?ICD Pacemaker/Loop - n/a ? ?Sleep Study -  Yes ?CPAP - uses nightly  ? ?Do not take Jardiance on Sunday or Monday (DOS). ? ?Blood Thinner Instructions:  Last dose of Xarelto will be on 06/02/21 per patient.     ? ?Anesthesia review: Yes ? ?STOP now taking any Aspirin (unless otherwise instructed by your surgeon), Aleve, Naproxen, Ibuprofen, Motrin, Advil, Goody's, BC's, all herbal medications, fish oil, and all vitamins.  ? ?Coronavirus Screening ?Do you have any of the following symptoms:  ?Cough yes/no: No ?Fever (>100.82F)  yes/no: No ?Runny nose yes/no: No ?Sore throat yes/no: No ?Difficulty breathing/shortness of breath  yes/no: No ? ?Have you traveled in the last 14 days and where? yes/no: No ? ?Patient verbalized understanding of instructions that were given via phone. ?

## 2021-06-02 NOTE — Telephone Encounter (Signed)
?*  STAT* If patient is at the pharmacy, call can be transferred to refill team. ? ? ?1. Which medications need to be refilled? (please list name of each medication and dose if known) new prescription for Plavix ? ?2. Which pharmacy/location (including street and city if local pharmacy) is medication to be sent to?[facility], [place] ? ?3. Do they need a 30 day or 90 day supply? 77 says- please call today, so not have any ? ?

## 2021-06-02 NOTE — Telephone Encounter (Signed)
Received note from Karoline Caldwell, [place] from preop clinic at [facility] that patient is scheduled for elective podiatric surgery 4/17. He contact the patient she reported stopping Plavix 04/11/21 and Dr. Serita Grit office instructed her to hold Xarelto starting today properatively. He was understandably concerned about lack of antiplatelet therapy. ? ?Per chart review - noted to be off Plavix at appt with GI 05/03/2021. Unclear why or when stopped.  ? ?Brief cardiac history: ?07/2950 Dx systolic and diastolic heart failure ?84/13/24 LHC with DES to LAD. Discharged on ASA '81mg'$  x 1 month, Plavix '75mg'$  QD x 6 mos, indefinite Xarelto.  ?01/2021 admission for anemia requiring PRBC. 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 Plavix and Xarelto resumed prior to discharge.  ?02/2021 seen in clinic and recommended to continue Plavix, Xarelto.  ? ?Given prior DES to [place] less than 6 months ago requires antiplatelet agent. Do not recommend elective procedures until at least 6 months post intervention. Will ask our nursing team to contact her to instruct her to resume Plavix '75mg'$  daily for stent protection.  ? ?In regards to anemia, most recent CBC 05/24/21 showed Hb 11.9. She is following with hematology regarding iron deficiency anemia. She received IV venofer '200mg'$  x 5 doses between 03/15/21-04/12/21. Holding Xarelto prior to surgical procedures at discretion of PCP as they manage.  ? ?Karoline Caldwell, PA and I agreed that since elective procedure, will cancel and can address preop clearance at her upcoming appointment with Dr. Harrell Gave 06/08/21. He is contacting patient to make her aware to resume Xarelto.  ? ?Loel Dubonnet, NP  ? ?

## 2021-06-05 ENCOUNTER — Ambulatory Visit (HOSPITAL_COMMUNITY): Admission: RE | Admit: 2021-06-05 | Payer: Medicare PPO | Source: Home / Self Care | Admitting: Podiatry

## 2021-06-05 HISTORY — DX: Sleep apnea, unspecified: G47.30

## 2021-06-05 HISTORY — DX: Dyspnea, unspecified: R06.00

## 2021-06-05 SURGERY — CORRECTION, HAMMER TOE
Anesthesia: Monitor Anesthesia Care | Site: Toe | Laterality: Right

## 2021-06-06 NOTE — Progress Notes (Signed)
?Cardiology Office Note:   ? ?Date:  06/16/2021  ? ?ID:  Ebony Scott, DOB December 27, 1950, MRN [id number] ? ?PCP:  [name], MD  ?Cardiologist:  Buford Dresser, MD PhD ? ?Referring MD: [name], MD  ? ?CC: follow up ? ?History of Present Illness:   ? ?Ebony Scott is a 71 y.o. 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 Initial consult was 10/15/17 for incidental coronary artery calcification seen on CT scan. ? ?Cardiac history: had CT chest 07/2017 which noted diffuse calcification, including aorta, aortic branch vessels, and coronary arteries. Had a normal Lexiscan in the past. No chest pain, main longstanding symptom is shortness of breath, followed by pulmonary. No history of clinical ASCVD events. History of DVT/PE. New diagnose of cardiomyopathy 08/2020. ? ?Today: ?She is accompanied by her son and not doing the best. Her shortness of breath is worsening. Recently, she endorses a productive cough with yellow sputum. She denies any palpitations, chest pain, lightheadedness, headaches, syncope, orthopnea, or PND. ? ?She is scheduled to undergo bilateral foot surgery. A ganglion cyst and callous will be removed from her R foot. A pin will be placed in her L foot where she previously broke a toe. She denies hematochezia or hematuria. She is able to walk up stairs but needs to hold on to the banister for her balance.  ? ?Her home blood pressure and weight are stable. She takes Lasix every morning and notices improvement with LE swelling ? ?Past Medical History:  ?Diagnosis Date  ? Abscess of dorsum of right hand 09/22/2020  ? Acute deep vein thrombosis (DVT) of right lower extremity ([place]) 12/13/2017  ? Allergic rhinitis   ? Anemia   ? Angio-edema   ? Anxiety   ? pt denies dx  ? Arthritis    ? Phreesia 10/21/2019  ? Asthma   ? Phreesia 10/21/2019  ? Breast cancer ([place]) 1998  ? Left breast, in remission  ? Cataract   ? REMOVED  ? CHF (congestive heart failure) ([place])   ? Clostridium difficile colitis 01/14/2018  ? Complication of anesthesia   ? "had hard time waking up from it several times" (02/20/2012)  ? Depression   ? "some; don't take anything for it" (02/20/2012), pt denies dx as of 06/02/21  ? Diverticulosis   ? DVT (deep venous thrombosis) ([place])   ? Dyspnea   ? all the time  ? Fibromyalgia 11/2011  ? GERD (gastroesophageal reflux disease)   ? Graves disease   ? Headache(784.0)   ? "related to allergies; more at different times during the year" (02/20/2012)  ? Hemorrhoids   ? Hiatal hernia   ? back and neck  ? History of blood transfusion 2023  ? 1 unit, 5 units of Iron  ? Hx of adenomatous colonic polyps 04/12/2016  ? Hypercholesteremia   ? good cholesterol is high  ? Hypothyroidism   ? IBS (irritable bowel syndrome)   ? Moderate persistent asthma   ? -FeV1 72% 2011, -IgE 102 2011, CT sinus Neg 2011  ? Osteoporosis   ? on reclast yearly  ? Peripheral vascular disease ([place]) 2019  ? DVTs (3) lungs and leg  ? Pneumonia 04/2011; ~ 11/2011  ? "double; single" (02/20/2012)  ? Septic olecranon bursitis of right elbow 09/22/2020  ? Seronegative rheumatoid arthritis ([place])   ?  Dr. Lahoma Rocker  ? SIRS (systemic inflammatory response syndrome) ([place]) 02/10/2018  ? Sleep apnea   ? uses cpap nightly  ? Tracheobronchomalacia   ? ? ?Past Surgical History:  ?Procedure Laterality Date  ? ABDOMINAL HYSTERECTOMY N/A   ? Phreesia 10/21/2019  ? ANTERIOR AND POSTERIOR REPAIR  1990's  ? APPENDECTOMY    ? ARGON LASER APPLICATION  29/93/7169  ? Procedure: ARGON LASER APPLICATION;  Surgeon: Yetta Flock, MD;  [facility];  Service: Gastroenterology;;  ? BREAST LUMPECTOMY  1998  ? left  ? BREAST SURGERY N/A   ? Phreesia 10/21/2019  ? BRONCHIAL WASHINGS  04/05/2020  ? Procedure: BRONCHIAL WASHINGS;  Surgeon: Freddi Starr, MD;  Location: [facility];  Service: Pulmonary;;  ? CARPOMETACARPEL ([place]) FUSION OF THUMB WITH AUTOGRAFT FROM RADIUS  ~ 2009  ? "both thumbs" (02/20/2012)  ? CATARACT EXTRACTION W/ INTRAOCULAR LENS  IMPLANT, BILATERAL  2012  ? CERVICAL DISCECTOMY  10/2001  ? C5-C6  ? CERVICAL FUSION  2003  ? C3-C4  ? CHOLECYSTECTOMY    ? COLONOSCOPY    ? CORONARY STENT INTERVENTION N/A 01/11/2021  ? Procedure: CORONARY STENT INTERVENTION;  Surgeon: Leonie Man, MD;  Location: [place] [facility];  Service: Cardiovascular;  Laterality: N/A;  ? DEBRIDEMENT TENNIS ELBOW  ?1970's  ? right  ? ESOPHAGOGASTRODUODENOSCOPY    ? ESOPHAGOGASTRODUODENOSCOPY (EGD) WITH PROPOFOL N/A 02/05/2021  ? Procedure: ESOPHAGOGASTRODUODENOSCOPY (EGD) WITH PROPOFOL;  Surgeon: Yetta Flock, MD;  [facility];  Service: Gastroenterology;  Laterality: N/A;  ? EYE SURGERY N/A   ? Phreesia 10/21/2019  ? HYSTERECTOMY    ? I & D EXTREMITY Right 09/22/2020  ? Procedure: IRRIGATION AND DEBRIDEMENT RIGHT HAND AND ELBOW;  Surgeon: Marchia Bond, MD;  [facility];  Service: Orthopedics;  Laterality: Right;  ? KNEE ARTHROPLASTY  ?1990's  ? "?right; w/cartilage repair" (02/20/2012)  ? NASAL SEPTUM SURGERY  1980's  ? POSTERIOR CERVICAL FUSION/FORAMINOTOMY  2004  ? "failed initial fusion; rewired  anterior neck" (02/20/2012)  ? REVERSE SHOULDER ARTHROPLASTY Right 02/16/2020  ? Procedure: REVERSE SHOULDER ARTHROPLASTY;  Surgeon: Marchia Bond, MD;  [facility];  Service: Orthopedics;  Laterality: Right;  ? RIGHT/LEFT HEART CATH AND CORONARY ANGIOGRAPHY N/A 01/11/2021  ? Procedure: RIGHT/LEFT HEART CATH AND CORONARY ANGIOGRAPHY;  Surgeon: Leonie Man, MD;  Location: [place] [facility];  Service: Cardiovascular;  Laterality: N/A;  ? SPINE SURGERY N/A   ? Phreesia 10/21/2019  ? TONSILLECTOMY  ~ 1953  ? VESICOVAGINAL FISTULA CLOSURE W/ TAH  1988  ? VIDEO BRONCHOSCOPY Bilateral 08/23/2016  ? Procedure: VIDEO BRONCHOSCOPY WITH FLUORO;   Surgeon: Javier Glazier, MD;  Location: [facility];  Service: Cardiopulmonary;  Laterality: Bilateral;  ? VIDEO BRONCHOSCOPY N/A 04/05/2020  ? Procedure: VIDEO BRONCHOSCOPY WITHOUT FLUORO;  Surgeon: Freddi Starr, MD;  Location: [facility];  Service: Pulmonary;  Laterality: N/A;  ? ? ?Current Medications: ?Current Outpatient Medications on File Prior to Visit  ?Medication Sig  ? acetaminophen (TYLENOL) 650 MG CR tablet Take 1,300 mg by mouth every 8 (eight) hours as needed for pain.  ? albuterol (VENTOLIN HFA) 108 (90 Base) MCG/ACT inhaler Inhale 2 puffs into the lungs every 6 (six) hours as needed for wheezing or shortness of breath.  ? allopurinol (ZYLOPRIM) 100 MG tablet Take 1 tablet (100 mg total) by mouth daily.  ? Alpha-D-Galactosidase (BEANO PO) Take 2-3 tablets by mouth 3 (three) times daily with meals.  ? Alpha-Lipoic Acid 600  MG TABS Take 600 mg by mouth daily.  ? atorvastatin (LIPITOR) 80 MG tablet Take 1 tablet (80 mg total) by mouth daily.  ? CALCIUM-MAGNESIUM PO Take 1 tablet by mouth every evening.  ? cholecalciferol (VITAMIN D) 25 MCG (1000 UNIT) tablet Take 1,000 Units by mouth every evening.  ? clopidogrel (PLAVIX) 75 MG tablet Take 1 tablet (75 mg total) by mouth daily.  ? denosumab (PROLIA) 60 MG/ML SOSY injection Inject 60 mg into the skin every 6 (six) months.  ? empagliflozin (JARDIANCE) 10 MG TABS tablet Take 1 tablet (10 mg total) by mouth daily before breakfast.  ? EPINEPHrine 0.3 mg/0.3 mL IJ SOAJ injection USE AS DIRECTED BY YOUR PHYSICIAN INTRAMUSCULARLY AS NEEDED FOR ANAPHYLAXIS  ? fluticasone (FLONASE) 50 MCG/ACT nasal spray USE 2 SPRAYS IN EACH NOSTRIL DAILY (Patient taking differently: 2 sprays daily as needed for allergies.)  ? folic acid (FOLVITE) 1 MG tablet Take 1 tablet (1 mg total) by mouth daily.  ? furosemide (LASIX) 40 MG tablet Take 1 tablet (40 mg total) by mouth daily. Take 1 table twice a day for 3 days and then back to once daily.  ? GAMUNEX-C 10 GM/100ML  SOLN   ? Glucose Blood (BLOOD GLUCOSE TEST STRIPS) STRP Please dispense per patient and insurance preference. Use as directed to monitor FSBS 4x . Dx: E11.65  ? guaiFENesin (MUCINEX) 600 MG 12 hr table

## 2021-06-07 ENCOUNTER — Encounter: Payer: Medicare PPO | Admitting: Podiatry

## 2021-06-08 ENCOUNTER — Ambulatory Visit (HOSPITAL_BASED_OUTPATIENT_CLINIC_OR_DEPARTMENT_OTHER): Payer: Medicare PPO | Admitting: Cardiology

## 2021-06-08 VITALS — BP 130/60 | HR 67 | Ht 61.0 in | Wt 130.7 lb

## 2021-06-08 DIAGNOSIS — I2510 Atherosclerotic heart disease of native coronary artery without angina pectoris: Secondary | ICD-10-CM

## 2021-06-08 DIAGNOSIS — Z01818 Encounter for other preprocedural examination: Secondary | ICD-10-CM

## 2021-06-08 DIAGNOSIS — E785 Hyperlipidemia, unspecified: Secondary | ICD-10-CM | POA: Diagnosis not present

## 2021-06-08 DIAGNOSIS — H608X2 Other otitis externa, left ear: Secondary | ICD-10-CM | POA: Diagnosis not present

## 2021-06-08 DIAGNOSIS — I5022 Chronic systolic (congestive) heart failure: Secondary | ICD-10-CM

## 2021-06-08 NOTE — Patient Instructions (Signed)
Medication Instructions:  ?Your Physician recommend you continue on your current medication as directed.   ? ? ?Please make sure you are taking the Plavix (clopidogrel) 75 mg daily.  ? ?If you are told that you need to stop the plavix (clopidogrel) for surgery, then I want you to take a baby aspirin (81 mg) while you are off the plavix. Once you are able to go back on the plavix, you can stop the aspirin. ? ?*If you need a refill on your cardiac medications before your next appointment, please call your pharmacy* ? ? ?Lab Work: ?None ordered today ? ? ?Testing/Procedures: ?None ordered today ? ? ?Follow-Up: ?At [facility], you and your health needs are our priority.  As part of our continuing mission to provide you with exceptional heart care, we have created designated Provider Care Teams.  These Care Teams include your primary Cardiologist (physician) and Advanced Practice Providers (APPs -  Physician Assistants and Nurse Practitioners) who all work together to provide you with the care you need, when you need it. ? ?We recommend signing up for the patient portal called "MyChart".  Sign up information is provided on this After Visit Summary.  MyChart is used to connect with patients for Virtual Visits (Telemedicine).  Patients are able to view lab/test results, encounter notes, upcoming appointments, etc.  Non-urgent messages can be sent to your provider as well.   ?To learn more about what you can do with MyChart, go to [email or web address].   ? ?Your next appointment:   ?3 month(s) ? ?The format for your next appointment:   ?In Person ? ?Provider:   ?Buford Dresser, MD{ ? ? ?Important Information About Sugar ? ? ? ? ? ? ? ?

## 2021-06-12 ENCOUNTER — Other Ambulatory Visit: Payer: Self-pay | Admitting: Family Medicine

## 2021-06-13 ENCOUNTER — Ambulatory Visit: Payer: Medicare PPO | Admitting: Allergy and Immunology

## 2021-06-13 ENCOUNTER — Encounter: Payer: Self-pay | Admitting: Allergy and Immunology

## 2021-06-13 VITALS — BP 132/72 | HR 72 | Temp 97.6°F | Resp 16 | Ht 61.0 in | Wt 130.8 lb

## 2021-06-13 DIAGNOSIS — G500 Trigeminal neuralgia: Secondary | ICD-10-CM

## 2021-06-13 DIAGNOSIS — K219 Gastro-esophageal reflux disease without esophagitis: Secondary | ICD-10-CM | POA: Diagnosis not present

## 2021-06-13 DIAGNOSIS — G478 Other sleep disorders: Secondary | ICD-10-CM | POA: Diagnosis not present

## 2021-06-13 DIAGNOSIS — D801 Nonfamilial hypogammaglobulinemia: Secondary | ICD-10-CM | POA: Diagnosis not present

## 2021-06-13 DIAGNOSIS — J3089 Other allergic rhinitis: Secondary | ICD-10-CM | POA: Diagnosis not present

## 2021-06-13 DIAGNOSIS — J479 Bronchiectasis, uncomplicated: Secondary | ICD-10-CM | POA: Diagnosis not present

## 2021-06-13 MED ORDER — AZELASTINE HCL 0.1 % NA SOLN: 2.0000 | Freq: Every day | NASAL | 5 refills | Status: DC

## 2021-06-13 MED ORDER — FAMOTIDINE 20 MG PO TABS: 20.0000 mg | ORAL_TABLET | Freq: Every evening | ORAL | 5 refills | Status: DC

## 2021-06-13 MED ORDER — CYPROHEPTADINE HCL 4 MG PO TABS: 8.0000 mg | ORAL_TABLET | Freq: Every evening | ORAL | 5 refills | Status: DC

## 2021-06-13 MED ORDER — CETIRIZINE HCL 10 MG PO TABS: 10.0000 mg | ORAL_TABLET | Freq: Two times a day (BID) | ORAL | 5 refills | Status: DC | PRN

## 2021-06-13 MED ORDER — DEXLANSOPRAZOLE 60 MG PO CPDR: 60.0000 mg | DELAYED_RELEASE_CAPSULE | Freq: Every day | ORAL | 3 refills | Status: DC

## 2021-06-13 NOTE — Patient Instructions (Addendum)
?  1. Continue to Treat facial pain and sleep dysfunction: ? ? A. Periactin 4 mg tablet  2 tablets at bedtime ? ?2. Continue to Treat laryngopharyngeal reflux:  ? ? A. Dexilant 60 mg in the morning ? B. famotidine 40 mg daily ? ?3.  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 ? A.  Continue immunoglobulin infusions currently using 10% solution ? B.  Tammy will attempt to change 10% to 20% immunoglobulin concentration ? ?4.  Use Astelin and Zyrtec and nasal saline and topical steroid if needed ? ?5.  Continue therapy directed by pulmonology and rheumatology ? ?6.  Submit for insurance approval of [employer]pite[employer] percussion vest to treat bronchiectasis/phlegm production ? ?7.  Blood - IgA/G/M ? ?8.  Return to clinic in 3 months or earlier if problem ?  ? ?  ?

## 2021-06-13 NOTE — Progress Notes (Addendum)
? ?[place] ? ? ?Follow-up Note ? ?Referring Provider: [name], MD ?Primary Provider: [name], MD ?Date of Office Visit: 06/13/2021 ? ?Subjective:  ? ?Ebony Scott (DOB: 1950/09/13) is a 71 y.o. female who returns to the [facility] on 06/13/2021 in re-evaluation of the following: ? ?HPI: Latondra returns to this clinic in evaluation of hypogammaglobulinemia, tracheobronchial malacia, bronchiectasis, asthma, chronic systemic steroid use, allergic rhinoconjunctivitis, facial pain syndrome, sleep dysfunction, and reflux.  I last saw her in this clinic on 28 February 2021. ? ?She continues on immunoglobulin infusions.  Unfortunately, because of an insurance issue she had to switch from a 20% solution to a 10% solution and this is a very uncomfortable procedure that requires 5 painful needle sticks placed for 2-1/2 hours and this causes a fair amount of swelling on her abdomen.  She would like to switch back to a 20% solution. ? ?She has not had any respiratory tract infections or other infections since being seen in this clinic last. ? ?We restarted her Periactin during her last visit and her headaches are now gone.  Her sleep is still a little bit disturbed and now she is using lorazepam prescribed by her primary care doctor in addition to her Periactin. ? ?Her reflux is under good control on her current plan. ? ?Her airway issue is stable.  She still has intermittent cough and still makes some sputum production on a regular basis.  The sputum production appears to be consistently yellow.  It is disturbing her sleep.  She has failed other forms of therapy to get rid of the mucus in her airway and she does have documented bronchiectasis.  She is still on systemic steroids on a daily basis.  She is followed by both rheumatology and pulmonology for a multitude of different issues involving her respiratory tract and joint issues. ? ?Allergies  as of 06/13/2021   ? ?   Reactions  ? Baclofen Other (See Comments)  ? Altered mental status requiring a 3-day hospital stay- patient became unresponsive  ? Dust Mite Extract Shortness Of Breath, Other (See Comments)  ? "sneezing" (02/20/2012)  ? Molds & Smuts Shortness Of Breath  ? Morphine And Related Hives, Itching  ? Other Shortness Of Breath, Other (See Comments)  ? Grass and weeds ?"sneezing; filled sinuses" (02/20/2012)  ? Penicillins Rash, Other (See Comments)  ? "welts" (02/20/2012) ?Tolerated Ancef 02/16/20.  ? Rofecoxib Swelling, Other (See Comments)  ? Vioxx- feet swelling  ? Shellfish Allergy Anaphylaxis, Shortness Of Breath, Itching, Rash  ? Tetracycline Hcl Nausea And Vomiting  ? Xolair [omalizumab] Other (See Comments)  ? Caused Blood clot  ? Zoledronic Acid Other (See Comments)  ? Fever, Put in hospital, dr said it was a reaction from a reaction  ?Other reaction(s): Unknown  ? Dilaudid [hydromorphone Hcl] Itching  ? Levofloxacin Other (See Comments)  ? GI upset  ? Oxycodone Hcl Nausea And Vomiting  ? Paroxetine Nausea And Vomiting, Other (See Comments)  ? Paxil  ? Celecoxib Swelling, Other (See Comments)  ? Feet swelling  ? Diltiazem Hcl Swelling  ? Hydrocodone-acetaminophen Other (See Comments)  ? Unknown reaction  ? Lactose Intolerance (gi) Other (See Comments)  ? Bloating and gas  ? Morphine Sulfate Itching, Other (See Comments)  ? "Gets loopy"  ? Oxycodone-acetaminophen Other (See Comments)  ? Unknown reaction  ? Rituximab   ? Anemia   ? Tree Extract Other (  See Comments)  ? "tested and told I was allergic to it; never experienced a reaction to it" (02/20/2012)  ? Penicillin G Procaine Rash, Other (See Comments)  ? Welts, also  ? ?  ? ?  ?Medication List  ? ? ?acetaminophen 650 MG CR tablet ?Commonly known as: TYLENOL ?Take 1,300 mg by mouth every 8 (eight) hours as needed for pain. ?  ?albuterol (2.5 MG/3ML) 0.083% nebulizer solution ?Commonly known as: PROVENTIL ?Take 3 mLs (2.5 mg total) by  nebulization every 4 (four) hours as needed for wheezing or shortness of breath. ?  ?albuterol 108 (90 Base) MCG/ACT inhaler ?Commonly known as: VENTOLIN HFA ?Inhale 2 puffs into the lungs every 6 (six) hours as needed for wheezing or shortness of breath. ?  ?ALIGN PO ?Take 1 capsule by mouth daily. ?  ?allopurinol 100 MG tablet ?Commonly known as: ZYLOPRIM ?Take 1 tablet (100 mg total) by mouth daily. ?  ?Alpha-Lipoic Acid 600 MG Tabs ?Take 600 mg by mouth daily. ?  ?atorvastatin 80 MG tablet ?Commonly known as: LIPITOR ?Take 1 tablet (80 mg total) by mouth daily. ?  ?azelastine 0.1 % nasal spray ?Commonly known as: ASTELIN ?Place 2 sprays into both nostrils daily. Use in each nostril as directed ?  ?BEANO PO ?Take 2-3 tablets by mouth 3 (three) times daily with meals. ?  ?Biofreeze 4 % Gel ?Generic drug: Menthol (Topical Analgesic) ?Apply 1 application. topically daily as needed (pain). ?  ?BLOOD GLUCOSE TEST STRIPS Strp ?Please dispense per patient and insurance preference. Use as directed to monitor FSBS 4x . Dx: E11.65 ?  ?budesonide 0.5 MG/2ML nebulizer solution ?Commonly known as: Pulmicort ?Take 2 mLs (0.5 mg total) by nebulization 2 (two) times daily as needed. ?  ?CALCIUM-MAGNESIUM PO ?Take 1 tablet by mouth every evening. ?  ?cetirizine 10 MG tablet ?Commonly known as: ZYRTEC ?Take 1 tablet (10 mg total) by mouth 2 (two) times daily as needed for allergies (Can take a n extra dose during flare ups.). ?  ?cholecalciferol 25 MCG (1000 UNIT) tablet ?Commonly known as: VITAMIN D ?Take 1,000 Units by mouth every evening. ?  ?clopidogrel 75 MG tablet ?Commonly known as: PLAVIX ?Take 1 tablet (75 mg total) by mouth daily. ?  ?cyproheptadine 4 MG tablet ?Commonly known as: PERIACTIN ?Take 2 tablets (8 mg total) by mouth at bedtime. Take 2 tablets at bedtime as needed to treat facial pain or sleep dysfunction. ?  ?denosumab 60 MG/ML Sosy injection ?Commonly known as: PROLIA ?Inject 60 mg into the skin every 6  (six) months. ?  ?dexlansoprazole 60 MG capsule ?Commonly known as: Dexilant ?Take 1 capsule (60 mg total) by mouth daily. ?  ?diclofenac Sodium 1 % Gel ?Commonly known as: VOLTAREN ?APPLY 2 GRAMS TOPICALLY 4 TIMES DAILY ?  ?empagliflozin 10 MG Tabs tablet ?Commonly known as: Jardiance ?Take 1 tablet (10 mg total) by mouth daily before breakfast. ?  ?EPINEPHrine 0.3 mg/0.3 mL Soaj injection ?Commonly known as: EPI-PEN ?USE AS DIRECTED BY YOUR PHYSICIAN INTRAMUSCULARLY AS NEEDED FOR ANAPHYLAXIS ?  ?famotidine 20 MG tablet ?Commonly known as: PEPCID ?Take 1 tablet (20 mg total) by mouth at bedtime. ?  ?fluticasone 50 MCG/ACT nasal spray ?Commonly known as: FLONASE ?USE 2 SPRAYS IN EACH NOSTRIL DAILY ?  ?folic acid 1 MG tablet ?Commonly known as: FOLVITE ?Take 1 tablet (1 mg total) by mouth daily. ?  ?furosemide 40 MG tablet ?Commonly known as: LASIX ?Take 1 tablet (40 mg total) by mouth daily. Take 1  table twice a day for 3 days and then back to once daily. ?  ?Gamunex-C 10 GM/100ML Soln ?Generic drug: Immune Globulin 10% ?  ?Gerhardt's butt cream Crea ?Apply topically 3 times a day as needed for itching or irritation ?  ?guaiFENesin 600 MG 12 hr tablet ?Commonly known as: Annona ?Take 1 tablet (600 mg total) by mouth 2 (two) times daily as needed for cough or to loosen phlegm. ?  ?isosorbide-hydrALAZINE 20-37.5 MG tablet ?Commonly known as: BIDIL ?Take 1 tablet by mouth every evening. ?  ?LACTOSE FAST ACTING RELIEF PO ?Take 2-3 tablets by mouth in the morning, at noon, and at bedtime. ?  ?lidocaine-prilocaine cream ?Commonly known as: EMLA ?Apply 1 application. topically as needed (pain). ?  ?LORazepam 0.5 MG tablet ?Commonly known as: ATIVAN ?Take 1 tablet (0.5 mg total) by mouth 3 times/day as needed-between meals & bedtime for sleep. ?  ?magnesium oxide 400 MG tablet ?Commonly known as: MAG-OX ?Take 400 mg by mouth every evening. ?  ?montelukast 10 MG tablet ?Commonly known as: SINGULAIR ?Take 1 tablet (10 mg  total) by mouth daily. ?  ?neomycin-polymyxin-hydrocortisone OTIC solution ?Commonly known as: CORTISPORIN ?Place 3 drops into the left ear 4 (four) times daily. ?  ?nystatin 100000 UNIT/ML suspension ?Commonly known as

## 2021-06-14 ENCOUNTER — Encounter: Payer: Self-pay | Admitting: Internal Medicine

## 2021-06-14 ENCOUNTER — Telehealth: Payer: Self-pay | Admitting: *Deleted

## 2021-06-14 ENCOUNTER — Encounter: Payer: Self-pay | Admitting: Allergy and Immunology

## 2021-06-14 ENCOUNTER — Encounter: Payer: Medicare PPO | Admitting: Podiatry

## 2021-06-14 ENCOUNTER — Ambulatory Visit: Payer: Medicare PPO | Admitting: Internal Medicine

## 2021-06-14 VITALS — BP 110/60 | HR 73 | Ht 61.0 in | Wt 130.1 lb

## 2021-06-14 DIAGNOSIS — Z7902 Long term (current) use of antithrombotics/antiplatelets: Secondary | ICD-10-CM

## 2021-06-14 DIAGNOSIS — D509 Iron deficiency anemia, unspecified: Secondary | ICD-10-CM

## 2021-06-14 DIAGNOSIS — J398 Other specified diseases of upper respiratory tract: Secondary | ICD-10-CM

## 2021-06-14 DIAGNOSIS — K31819 Angiodysplasia of stomach and duodenum without bleeding: Secondary | ICD-10-CM

## 2021-06-14 DIAGNOSIS — I5042 Chronic combined systolic (congestive) and diastolic (congestive) heart failure: Secondary | ICD-10-CM

## 2021-06-14 DIAGNOSIS — Z86010 Personal history of colonic polyps: Secondary | ICD-10-CM | POA: Diagnosis not present

## 2021-06-14 DIAGNOSIS — Z7901 Long term (current) use of anticoagulants: Secondary | ICD-10-CM | POA: Diagnosis not present

## 2021-06-14 LAB — IGG, IGA, IGM
IgA/Immunoglobulin A, Serum: 65 mg/dL (ref 64–422)
IgG (Immunoglobin G), Serum: 798 mg/dL (ref 586–1602)
IgM (Immunoglobulin M), Srm: 57 mg/dL (ref 26–217)

## 2021-06-14 NOTE — Patient Instructions (Signed)
Please call in June for an August appointment. ? ?We will put you into our system for a February 2024 colon recall. ? ? ?I appreciate the opportunity to care for you. ?Silvano Rusk, MD, [facility] ?

## 2021-06-14 NOTE — Progress Notes (Signed)
? ?Ebony Scott 71 y.o. [birth year] [id number] ? ?Assessment & Plan:  ? ?Encounter Diagnoses  ?Name Primary?  ? GAVE (gastric antral vascular ectasia) Yes  ? Iron deficiency anemia, unspecified iron deficiency anemia type   ? Hx of adenomatous colonic polyps   ? Long term current use of antithrombotics/antiplatelets   ? Long term current use of anticoagulant   ? Tracheobronchomalacia   ? Chronic combined systolic and diastolic CHF (congestive heart failure) (Ebony Scott)   ? ? ?She is significantly improved with respect to her iron deficiency anemia.  We have decided to observe things at this point given her comorbidities.  I will see her again in August (she will call for an appointment in June).  Regarding colon polyps she does not feel up to scheduling a surveillance colonoscopy and I do not think we need to be in a hurry she had 3 diminutive adenomas and she is just a little bit over 5 years.  We will put a recall in for February of next year and revisit.  Any of her procedures would need to be done at the hospital I think. ? ?I appreciate the opportunity to care for this patient. ?CC: Ebony Frizzle, MD ? ? ? ?Subjective:  ? ?Chief Complaint: Gave follow-up ? 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  She also has a history of 3 adenomatous colonic polyps in February 2018. ? ?She remains quite fatigued.  Recall that she was treated in December with APC therapy of her GAVE by Dr. Havery Moros when she was hospitalized.  She was seen in March and was still quite weak and not ready for any procedures.  She had abdominal distention and I wondered about the possibility of ascites but an abdominal ultrasound was unrevealing.  She receives parenteral iron therapy through hematology and  has an appointment to see Dr. Lorenso Courier in July.  She has not noted any signs of melena or other bleeding from her GI tract. ? ?She is due to have bilateral foot surgery from podiatry.  She saw cardiology.  Her EF is 30 to 35%. ? ?Wt Readings from Last 3 Encounters:  ?06/14/21 130 lb 2 oz (59 kg)  ?06/13/21 130 lb 12.8 oz (59.3 kg)  ?06/08/21 130 lb 11.2 oz (59.3 kg)  ? ? ? ? ?  Latest Ref Rng & Units 05/24/2021  ?  2:59 PM 05/03/2021  ? 10:27 AM 03/24/2021  ?  9:06 AM  ?CBC  ?WBC 4.0 - 10.5 K/uL 12.3   8.9   8.6    ?Hemoglobin 12.0 - 15.0 g/dL 11.9   11.6   8.9    ?Hematocrit 36.0 - 46.0 % 37.1   35.6   29.8    ?Platelets 150 - 400 K/uL 196   190.0   275    ? ?Lab Results  ?Component Value Date  ? IRON 78 05/24/2021  ? TIBC 337 05/24/2021  ? FERRITIN 458 (H) 05/24/2021  ? ? ?Allergies  ?Allergen Reactions  ? Baclofen Other (See Comments)  ?  Altered mental status requiring a 3-day hospital stay- patient became unresponsive  ? Dust Mite Extract Shortness Of Breath and Other (See Comments)  ?  "sneezing" (02/20/2012)  ? Molds & Smuts Shortness Of Breath  ? Morphine And Related Hives and Itching  ?  Other Shortness Of Breath and Other (See Comments)  ?  Grass and weeds ?"sneezing; filled sinuses" (02/20/2012)  ? Penicillins Rash and Other (See Comments)  ?  "welts" (02/20/2012) ?Tolerated Ancef 02/16/20. ?  ? Rofecoxib Swelling and Other (See Comments)  ?  Vioxx- feet swelling ?  ? Shellfish Allergy Anaphylaxis, Shortness Of Breath, Itching and Rash  ? Tetracycline Hcl Nausea And Vomiting  ? Xolair [Omalizumab] Other (See Comments)  ?  Caused Blood clot  ? Zoledronic Acid Other (See Comments)  ?  Fever, Put in hospital, dr said it was a reaction from a reaction  ?Other reaction(s): Unknown  ? Dilaudid [Hydromorphone Hcl] Itching  ? Levofloxacin Other (See Comments)  ?  GI upset  ? Oxycodone Hcl Nausea And Vomiting  ? Paroxetine Nausea And Vomiting and Other (See Comments)  ?  Paxil ?  ? Celecoxib Swelling and Other (See Comments)  ?   Feet swelling  ? Diltiazem Hcl Swelling  ? Hydrocodone-Acetaminophen Other (See Comments)  ?  Unknown reaction  ? Lactose Intolerance (Gi) Other (See Comments)  ?  Bloating and gas  ? Morphine Sulfate Itching and Other (See Comments)  ?  "Gets loopy"  ? Oxycodone-Acetaminophen Other (See Comments)  ?  Unknown reaction  ? Rituximab   ?  Anemia   ? Tree Extract Other (See Comments)  ?  "tested and told I was allergic to it; never experienced a reaction to it" (02/20/2012)  ? Penicillin G Procaine Rash and Other (See Comments)  ?  Welts, also  ? ?Current Meds  ?Medication Sig  ? acetaminophen (TYLENOL) 650 MG CR tablet Take 1,300 mg by mouth every 8 (eight) hours as needed for pain.  ? albuterol (PROVENTIL) (2.5 MG/3ML) 0.083% nebulizer solution Take 3 mLs (2.5 mg total) by nebulization every 4 (four) hours as needed for wheezing or shortness of breath.  ? albuterol (VENTOLIN HFA) 108 (90 Base) MCG/ACT inhaler Inhale 2 puffs into the lungs every 6 (six) hours as needed for wheezing or shortness of breath.  ? allopurinol (ZYLOPRIM) 100 MG tablet Take 1 tablet (100 mg total) by mouth daily.  ? Alpha-D-Galactosidase (BEANO PO) Take 2-3 tablets by mouth 3 (three) times daily with meals.  ? Alpha-Lipoic Acid 600 MG TABS Take 600 mg by mouth daily.  ? atorvastatin (LIPITOR) 80 MG tablet Take 1 tablet (80 mg total) by mouth daily.  ? azelastine (ASTELIN) 0.1 % nasal spray Place 2 sprays into both nostrils daily. Use in each nostril as directed  ? budesonide (PULMICORT) 0.5 MG/2ML nebulizer solution Take 2 mLs (0.5 mg total) by nebulization 2 (two) times daily as needed.  ? CALCIUM-MAGNESIUM PO Take 1 tablet by mouth every evening.  ? cetirizine (ZYRTEC) 10 MG tablet Take 1 tablet (10 mg total) by mouth 2 (two) times daily as needed for allergies (Can take a n extra dose during flare ups.).  ? cholecalciferol (VITAMIN D) 25 MCG (1000 UNIT) tablet Take 1,000 Units by mouth every evening.  ? clopidogrel (PLAVIX) 75 MG tablet Take  1 tablet (75 mg total) by mouth daily.  ? cyproheptadine (PERIACTIN) 4 MG tablet Take 2 tablets (8 mg total) by mouth at bedtime. Take 2 tablets at bedtime as needed to treat facial pain or sleep dysfunction.  ? denosumab (PROLIA) 60 MG/ML SOSY injection Inject 60 mg into the skin every 6 (six) months.  ? dexlansoprazole (DEXILANT) 60 MG capsule Take 1 capsule (60 mg total) by mouth daily.  ? diclofenac Sodium (  VOLTAREN) 1 % GEL APPLY 2 GRAMS TOPICALLY 4 TIMES DAILY  ? empagliflozin (JARDIANCE) 10 MG TABS tablet Take 1 tablet (10 mg total) by mouth daily before breakfast.  ? EPINEPHrine 0.3 mg/0.3 mL IJ SOAJ injection USE AS DIRECTED BY YOUR PHYSICIAN INTRAMUSCULARLY AS NEEDED FOR ANAPHYLAXIS  ? famotidine (PEPCID) 20 MG tablet Take 1 tablet (20 mg total) by mouth at bedtime.  ? fluticasone (FLONASE) 50 MCG/ACT nasal spray USE 2 SPRAYS IN EACH NOSTRIL DAILY (Patient taking differently: 2 sprays daily as needed for allergies.)  ? folic acid (FOLVITE) 1 MG tablet Take 1 tablet (1 mg total) by mouth daily.  ? furosemide (LASIX) 40 MG tablet Take 1 tablet (40 mg total) by mouth daily. Take 1 table twice a day for 3 days and then back to once daily.  ? GAMUNEX-C 10 GM/100ML SOLN   ? Glucose Blood (BLOOD GLUCOSE TEST STRIPS) STRP Please dispense per patient and insurance preference. Use as directed to monitor FSBS 4x . Dx: E11.65  ? guaiFENesin (MUCINEX) 600 MG 12 hr tablet Take 1 tablet (600 mg total) by mouth 2 (two) times daily as needed for cough or to loosen phlegm. (Patient taking differently: Take 600 mg by mouth 2 (two) times daily.)  ? isosorbide-hydrALAZINE (BIDIL) 20-37.5 MG tablet Take 1 tablet by mouth every evening.  ? Lactase (LACTOSE FAST ACTING RELIEF PO) Take 2-3 tablets by mouth in the morning, at noon, and at bedtime.  ? lidocaine-prilocaine (EMLA) cream Apply 1 application. topically as needed (pain).  ? LORazepam (ATIVAN) 0.5 MG tablet Take 1 tablet (0.5 mg total) by mouth 3 times/day as  needed-between meals & bedtime for sleep.  ? magnesium oxide (MAG-OX) 400 MG tablet Take 400 mg by mouth every evening.  ? Menthol, Topical Analgesic, (BIOFREEZE) 4 % GEL Apply 1 application. topically daily a

## 2021-06-14 NOTE — Telephone Encounter (Signed)
-----   Message from Jiles Prows, MD sent at 06/14/2021  6:54 AM EDT ----- ?She has had problems with 10% solution.  Can we now petition to have her restart at 20% solution? ? ?

## 2021-06-14 NOTE — Telephone Encounter (Signed)
Called patient and advised approval and change back to Hizentra. Will send new Rx to [facility] and d/c Gammunex-C and replace with Hizentra ?

## 2021-06-16 ENCOUNTER — Encounter (HOSPITAL_BASED_OUTPATIENT_CLINIC_OR_DEPARTMENT_OTHER): Payer: Self-pay | Admitting: Cardiology

## 2021-06-16 ENCOUNTER — Telehealth: Payer: Self-pay | Admitting: Cardiology

## 2021-06-16 NOTE — Telephone Encounter (Signed)
-  Per MD, clearance sent. ?-Pt made aware via voicemail ? ?

## 2021-06-16 NOTE — Telephone Encounter (Signed)
Pt states that she needs completed message or documentation sent to Dr. Posey Pronto stating the she is approved to have upcoming procedure. Please advise ?

## 2021-06-16 NOTE — Telephone Encounter (Signed)
Patient needs preop clearance sent to provider  ?

## 2021-06-17 ENCOUNTER — Other Ambulatory Visit: Payer: Self-pay | Admitting: Family Medicine

## 2021-06-19 ENCOUNTER — Telehealth: Payer: Self-pay | Admitting: Allergy and Immunology

## 2021-06-19 ENCOUNTER — Other Ambulatory Visit: Payer: Self-pay | Admitting: Family Medicine

## 2021-06-19 NOTE — Telephone Encounter (Signed)
Faxed updated ov notes and CT scan to [place]. ?

## 2021-06-19 NOTE — Telephone Encounter (Signed)
Sarah with [facility] would like call back in regards to patient's notes.  ? ?Best contact number: [phone] ?

## 2021-06-20 ENCOUNTER — Other Ambulatory Visit: Payer: Self-pay

## 2021-06-20 DIAGNOSIS — R21 Rash and other nonspecific skin eruption: Secondary | ICD-10-CM

## 2021-06-20 MED ORDER — TRIAMCINOLONE ACETONIDE 0.1 % EX OINT: TOPICAL_OINTMENT | Freq: Two times a day (BID) | CUTANEOUS | 2 refills | Status: DC

## 2021-06-20 NOTE — Telephone Encounter (Signed)
Patient called and advised her rheumatologist is the one who prescribed the Prednisone 5 mg. She says she would like Dr. Dennard Schaumann to refill her medication and the one she's needing is Prednisone 1 mg sent to [facility]. Advised I will send the request to him for consideration. ?

## 2021-06-20 NOTE — Telephone Encounter (Signed)
Next Visit: 08/09/2021 ?  ?Last Visit: 04/28/2021 ?  ?Last Fill: 05/12/2021 ?  ?Dx: Rash and other nonspecific skin eruption  ?  ?Current Dose per office note on 04/28/2021: triamcinolone ointment (KENALOG) 0.1 % ?  ?Okay to refill Kenalog Cream?   ?

## 2021-06-20 NOTE — Telephone Encounter (Signed)
Pt called in requesting a refill of this med predniSONE (DELTASONE) 5 MG, this med was not prescribed by pt's pcp, and pt did not state which pharmacy she would like for this refill to be sent to. Please advise. ? ?Cb#: [id number] ? ?

## 2021-06-20 NOTE — Telephone Encounter (Signed)
Requested medication (s) are due for refill today: Unsure ? ?Requested medication (s) are on the active medication list: yes   ? ?Last refill: 05/01/21 #180  0 refills ? ?Future visit scheduled no ? ?Notes to clinic: Per earlier encounter:  Patient called and advised her rheumatologist is the one who prescribed the Prednisone 5 mg. She says she would like Dr. Dennard Schaumann to refill her medication and the one she's needing is Prednisone 1 mg sent to [facility]. Advised I will send the request to him for consideration. ? ?5:35  Called pt to verify she needed refill, should have '1mg'$  tabs until 08/01/21. Pt states her daughter handles her medication and just told her she only had a few left. Please advise.  Not delegated and historical provider.  ? ?Requested Prescriptions  ?Pending Prescriptions Disp Refills  ? predniSONE (DELTASONE) 1 MG tablet 180 tablet 0  ?  Sig: Take 2 tablets (2 mg total) by mouth as directed. Take along with 5 mg tablet=7 mg  ?  ? Not Delegated - Endocrinology:  Oral Corticosteroids Failed - 06/20/2021  1:21 PM  ?  ?  Failed - This refill cannot be delegated  ?  ?  Failed - Manual Review: Eye exam for IOP if prolonged treatment  ?  ?  Failed - Glucose (serum) in normal range and within 180 days  ?  Glucose, Bld  ?Date Value Ref Range Status  ?05/24/2021 196 (H) 70 - 99 mg/dL Final  ?  Comment:  ?  Glucose reference range applies only to samples taken after fasting for at least 8 hours.  ? ?Glucose-Capillary  ?Date Value Ref Range Status  ?02/07/2021 183 (H) 70 - 99 mg/dL Final  ?  Comment:  ?  Glucose reference range applies only to samples taken after fasting for at least 8 hours.  ?  ?  ?  ?  Failed - Bone Mineral Density or Dexa Scan completed in the last 2 years  ?  ?  Passed - K in normal range and within 180 days  ?  Potassium  ?Date Value Ref Range Status  ?05/24/2021 4.4 3.5 - 5.1 mmol/L Final  ?  ?  ?  ?  Passed - Na in normal range and within 180 days  ?  Sodium  ?Date Value Ref  Range Status  ?05/24/2021 140 135 - 145 mmol/L Final  ?02/03/2021 141 134 - 144 mmol/L Final  ?  ?  ?  ?  Passed - Last BP in normal range  ?  BP Readings from Last 1 Encounters:  ?06/14/21 110/60  ?  ?  ?  ?  Passed - Valid encounter within last 6 months  ?  Recent Outpatient Visits   ? ?      ? 3 weeks ago Chronic fatigue  ? [name], Cammie Mcgee, MD  ? 3 months ago Anemia, unspecified type  ? [facility] [name], Cammie Mcgee, MD  ? 4 months ago Anemia, unspecified type  ? [name], Cammie Mcgee, MD  ? 6 months ago Type 2 diabetes mellitus with other specified complication, unspecified whether long term insulin use ([place])  ? [name], Cammie Mcgee, MD  ? 7 months ago Chronic combined systolic and diastolic CHF (congestive heart failure) ([place])  ? [name], Cammie Mcgee, MD  ? ?  ?  ?Future Appointments   ? ?        ?  In 1 month Rice, Resa Miner, MD [facility]  ? In 2 months Buford Dresser, MD [facility]  ? ?  ? ? ?  ?  ?  ? ? ? ? ?

## 2021-06-20 NOTE — Telephone Encounter (Signed)
Patient called requesting prescription refill of Triamcinolone ointment 0.1% to be sent to [facility].  Patient is requesting the prescription be sent in this afternoon so her son can pick it up for her.  Patient states the itching is so bad that she feels like she is "tearing her skin apart."   ?

## 2021-06-21 DIAGNOSIS — M1712 Unilateral primary osteoarthritis, left knee: Secondary | ICD-10-CM | POA: Diagnosis not present

## 2021-06-21 DIAGNOSIS — M25512 Pain in left shoulder: Secondary | ICD-10-CM | POA: Diagnosis not present

## 2021-06-23 ENCOUNTER — Other Ambulatory Visit: Payer: Self-pay | Admitting: Internal Medicine

## 2021-06-23 ENCOUNTER — Other Ambulatory Visit: Payer: Self-pay | Admitting: Family Medicine

## 2021-06-23 DIAGNOSIS — M0600 Rheumatoid arthritis without rheumatoid factor, unspecified site: Secondary | ICD-10-CM

## 2021-06-23 NOTE — Telephone Encounter (Signed)
Requested medication (s) are due for refill today: No    Due 08/01/21 ? ?Requested medication (s) are on the active medication list: yes   ? ?Last refill: 05/01/21  #180  0 refills ? ?Future visit scheduled no ? ?Notes to clinic:Historical Provider, please review. Thank you. ? ?Requested Prescriptions  ?Pending Prescriptions Disp Refills  ? predniSONE (DELTASONE) 1 MG tablet [Pharmacy Med Name: PREDNISONE 1 MG TAB] 60 tablet   ?  Sig: TAKE 2 TABLETS BY MOUTH DAILY WITH BREAKFAST.Marland KitchenMarland KitchenTAKE WITH '10MG'$  TO EQUAL '12MG'$ DAILY  ?  ? Not Delegated - Endocrinology:  Oral Corticosteroids Failed - 06/23/2021 11:13 AM  ?  ?  Failed - This refill cannot be delegated  ?  ?  Failed - Manual Review: Eye exam for IOP if prolonged treatment  ?  ?  Failed - Glucose (serum) in normal range and within 180 days  ?  Glucose, Bld  ?Date Value Ref Range Status  ?05/24/2021 196 (H) 70 - 99 mg/dL Final  ?  Comment:  ?  Glucose reference range applies only to samples taken after fasting for at least 8 hours.  ? ?Glucose-Capillary  ?Date Value Ref Range Status  ?02/07/2021 183 (H) 70 - 99 mg/dL Final  ?  Comment:  ?  Glucose reference range applies only to samples taken after fasting for at least 8 hours.  ?  ?  ?  ?  Failed - Bone Mineral Density or Dexa Scan completed in the last 2 years  ?  ?  Passed - K in normal range and within 180 days  ?  Potassium  ?Date Value Ref Range Status  ?05/24/2021 4.4 3.5 - 5.1 mmol/L Final  ?  ?  ?  ?  Passed - Na in normal range and within 180 days  ?  Sodium  ?Date Value Ref Range Status  ?05/24/2021 140 135 - 145 mmol/L Final  ?02/03/2021 141 134 - 144 mmol/L Final  ?  ?  ?  ?  Passed - Last BP in normal range  ?  BP Readings from Last 1 Encounters:  ?06/14/21 110/60  ?  ?  ?  ?  Passed - Valid encounter within last 6 months  ?  Recent Outpatient Visits   ? ?      ? 3 weeks ago Chronic fatigue  ? [name], Cammie Mcgee, MD  ? 3 months ago Anemia, unspecified type  ? [facility] [name], Cammie Mcgee, MD  ? 4 months ago Anemia, unspecified type  ? [facility] [name], Cammie Mcgee, MD  ? 6 months ago Type 2 diabetes mellitus with other specified complication, unspecified whether long term insulin use ([place])  ? [facility] [name], Cammie Mcgee, MD  ? 7 months ago Chronic combined systolic and diastolic CHF (congestive heart failure) ([place])  ? [facility] [name], Cammie Mcgee, MD  ? ?  ?  ?Future Appointments   ? ?        ? In 1 month Rice, Resa Miner, MD [facility]  ? In 2 months Buford Dresser, MD [facility]  ? ?  ? ? ?  ?  ?  ? ? ? ? ?

## 2021-06-23 NOTE — Telephone Encounter (Signed)
Per chart prednisone is prescribed by Dr. Benjamine Mola. Contacted pharmacy and advised. They will forward refill request to him.  ?

## 2021-06-25 ENCOUNTER — Encounter: Payer: Self-pay | Admitting: Allergy and Immunology

## 2021-06-27 DIAGNOSIS — R059 Cough, unspecified: Secondary | ICD-10-CM | POA: Diagnosis not present

## 2021-06-27 DIAGNOSIS — J398 Other specified diseases of upper respiratory tract: Secondary | ICD-10-CM | POA: Diagnosis not present

## 2021-06-27 DIAGNOSIS — J479 Bronchiectasis, uncomplicated: Secondary | ICD-10-CM | POA: Diagnosis not present

## 2021-06-27 DIAGNOSIS — J45901 Unspecified asthma with (acute) exacerbation: Secondary | ICD-10-CM | POA: Diagnosis not present

## 2021-06-28 ENCOUNTER — Encounter: Payer: Medicare PPO | Admitting: Podiatry

## 2021-06-29 DIAGNOSIS — Z9889 Other specified postprocedural states: Secondary | ICD-10-CM | POA: Diagnosis not present

## 2021-06-29 DIAGNOSIS — Z9089 Acquired absence of other organs: Secondary | ICD-10-CM | POA: Diagnosis not present

## 2021-06-29 DIAGNOSIS — H938X2 Other specified disorders of left ear: Secondary | ICD-10-CM | POA: Diagnosis not present

## 2021-06-29 DIAGNOSIS — Z8669 Personal history of other diseases of the nervous system and sense organs: Secondary | ICD-10-CM | POA: Diagnosis not present

## 2021-07-10 ENCOUNTER — Other Ambulatory Visit: Payer: Self-pay | Admitting: Family Medicine

## 2021-07-11 NOTE — Telephone Encounter (Signed)
Requested medication (s) are due for refill today - provider review   Requested medication (s) are on the active medication list -yes  Future visit scheduled - no  Last refill: - Prednisone- 05/01/21 #180- outside provider                    Tramadol- 04/11/21 #30 2RF- non delegated                      Benzonatate- no longer on current medication list  Notes to clinic: Refill request for provider review   Requested Prescriptions  Pending Prescriptions Disp Refills   predniSONE (DELTASONE) 1 MG tablet [Pharmacy Med Name: PREDNISONE 1 MG TAB] 60 tablet     Sig: TAKE 2 TABLETS BY MOUTH DAILY WITH BREAKFAST.Marland KitchenMarland KitchenTAKE WITH '10MG'$  TO EQUAL '12MG'$ DAILY     Not Delegated - Endocrinology:  Oral Corticosteroids Failed - 07/10/2021  8:49 AM      Failed - This refill cannot be delegated      Failed - Manual Review: Eye exam for IOP if prolonged treatment      Failed - Glucose (serum) in normal range and within 180 days    Glucose, Bld  Date Value Ref Range Status  05/24/2021 196 (H) 70 - 99 mg/dL Final    Comment:    Glucose reference range applies only to samples taken after fasting for at least 8 hours.   Glucose-Capillary  Date Value Ref Range Status  02/07/2021 183 (H) 70 - 99 mg/dL Final    Comment:    Glucose reference range applies only to samples taken after fasting for at least 8 hours.         Failed - Bone Mineral Density or Dexa Scan completed in the last 2 years      3 - K in normal range and within 180 days    Potassium  Date Value Ref Range Status  05/24/2021 4.4 3.5 - 5.1 mmol/L Final         Passed - Na in normal range and within 180 days    Sodium  Date Value Ref Range Status  05/24/2021 140 135 - 145 mmol/L Final  02/03/2021 141 134 - 144 mmol/L Final         Passed - Last BP in normal range    BP Readings from Last 1 Encounters:  06/14/21 110/60         Passed - Valid encounter within last 6 months    Recent Outpatient Visits           1 month ago  Chronic fatigue   [place] Schaumann, Cammie Mcgee, MD   4 months ago Anemia, unspecified type   [place] [name], Cammie Mcgee, MD   4 months ago Anemia, unspecified type   [place] [name], MD   7 months ago Type 2 diabetes mellitus with other specified complication, unspecified whether long term insulin use ([place])   [place] [name], Cammie Mcgee, MD   8 months ago Chronic combined systolic and diastolic CHF (congestive heart failure) ([place])   [facility] [name], Cammie Mcgee, MD       Future Appointments             In 4 weeks Rice, Resa Miner, MD [place]   In 2 months Buford Dresser, MD [facility]  traMADol (ULTRAM) 50 MG tablet [Pharmacy Med Name: TRAMADOL HCL 50 MG TAB] 30 tablet     Sig: TAKE 1 TABLET BY MOUTH DAILY AT BEDTIME     Not Delegated - Analgesics:  Opioid Agonists Failed - 07/10/2021  8:49 AM      Failed - This refill cannot be delegated      Failed - Urine Drug Screen completed in last 360 days      Passed - Valid encounter within last 3 months    Recent Outpatient Visits           1 month ago Chronic fatigue   [place] Dennard Schaumann, Cammie Mcgee, MD   4 months ago Anemia, unspecified type   [place] [name], Cammie Mcgee, MD   4 months ago Anemia, unspecified type   [place] [name], MD   7 months ago Type 2 diabetes mellitus with other specified complication, unspecified whether long term insulin use ([place])   [place] [name], Cammie Mcgee, MD   8 months ago Chronic combined systolic and diastolic CHF (congestive heart failure) ([place])   [place] [name], Cammie Mcgee, MD       Future Appointments             In 4 weeks Rice, Resa Miner, MD [place]   In 2 months Buford Dresser, MD [facility]              benzonatate (TESSALON) 200 MG capsule [Pharmacy Med Name: BENZONATATE 200 MG CAP] 30 capsule 0    Sig: TAKE 1 CAPSULE BY MOUTH THREE TIMES DAILY AS NEEDED FOR COUGH     Ear, Nose, and Throat:  Antitussives/Expectorants Passed - 07/10/2021  8:49 AM      Passed - Valid encounter within last 12 months    Recent Outpatient Visits           1 month ago Chronic fatigue   [place] [name], Cammie Mcgee, MD   4 months ago Anemia, unspecified type   [place] [name], Cammie Mcgee, MD   4 months ago Anemia, unspecified type   [place] [name], MD   7 months ago Type 2 diabetes mellitus with other specified complication, unspecified whether long term insulin use ([place])   [place] [name], Cammie Mcgee, MD   8 months ago Chronic combined systolic and diastolic CHF (congestive heart failure) ([place])   [facility] [name], Cammie Mcgee, MD       Future Appointments             In 4 weeks Rice, Resa Miner, MD [place]   In 2 months Buford Dresser, MD [facility]                Requested Prescriptions  Pending Prescriptions Disp Refills   predniSONE (DELTASONE) 1 MG tablet [Pharmacy Med Name: PREDNISONE 1 MG TAB] 60 tablet     Sig: TAKE 2 TABLETS BY MOUTH DAILY WITH BREAKFAST.Marland KitchenMarland KitchenTAKE WITH '10MG'$  TO EQUAL '12MG'$ DAILY     Not Delegated - Endocrinology:  Oral Corticosteroids Failed - 07/10/2021  8:49 AM      Failed - This refill cannot be delegated      Failed - Manual Review: Eye exam for IOP if prolonged treatment      Failed - Glucose (serum) in normal range  and within 180 days    Glucose, Bld  Date Value Ref Range Status  05/24/2021 196 (H) 70 - 99 mg/dL Final    Comment:    Glucose reference range applies only to samples taken after fasting for at least 8 hours.    Glucose-Capillary  Date Value Ref Range Status  02/07/2021 183 (H) 70 - 99 mg/dL Final    Comment:    Glucose reference range applies only to samples taken after fasting for at least 8 hours.         Failed - Bone Mineral Density or Dexa Scan completed in the last 2 years      18 - K in normal range and within 180 days    Potassium  Date Value Ref Range Status  05/24/2021 4.4 3.5 - 5.1 mmol/L Final         Passed - Na in normal range and within 180 days    Sodium  Date Value Ref Range Status  05/24/2021 140 135 - 145 mmol/L Final  02/03/2021 141 134 - 144 mmol/L Final         Passed - Last BP in normal range    BP Readings from Last 1 Encounters:  06/14/21 110/60         Passed - Valid encounter within last 6 months    Recent Outpatient Visits           1 month ago Chronic fatigue   [place] Denn[place] Schaumann, Cammie Mcgee, MD   4 months ago Anemia, unspecified type   [place] [name], Cammie Mcgee, MD   4 months ago Anemia, unspecified type   [place] [name], MD   7 months ago Type 2 diabetes mellitus with other specified complication, unspecified whether long term insulin use ([place])   [place] [name], Cammie Mcgee, MD   8 months ago Chronic combined systolic and diastolic CHF (congestive heart failure) ([place])   [place] [name], Cammie Mcgee, MD       Future Appointments             In 4 weeks Rice, Resa Miner, MD [place]   In 2 months Buford Dresser, MD [facility]              traMADol (ULTRAM) 50 MG tablet [Pharmacy Med Name: TRAMADOL HCL 50 MG TAB] 30 tablet     Sig: TAKE 1 TABLET BY MOUTH DAILY AT BEDTIME     Not Delegated - Analgesics:  Opioid Agonists Failed - 07/10/2021  8:49 AM      Failed - This refill cannot be delegated      Failed - Urine Drug Screen completed in last 360 days      Passed -  Valid encounter within last 3 months    Recent Outpatient Visits           1 month ago Chronic fatigue   [place] Dennard Schaumann, Cammie Mcgee, MD   4 months ago Anemia, unspecified type   [place], Cammie Mcgee, MD   4 months ago Anemia, unspecified type   [place], Warren T, MD   7 months ago Type 2 diabetes mellitus with other specified complication, unspecified whether long term insulin use ([place])   [place] [name], Cammie Mcgee, MD   8 months ago Chronic combined systolic and diastolic CHF (  congestive heart failure) ([place])   [place] [name], Cammie Mcgee, MD       Future Appointments             In 4 weeks Rice, Resa Miner, MD [place]   In 2 months Buford Dresser, MD [facility]              benzonatate (TESSALON) 200 MG capsule [Pharmacy Med Name: BENZONATATE 200 MG CAP] 30 capsule 0    Sig: TAKE 1 CAPSULE BY MOUTH THREE TIMES DAILY AS NEEDED FOR COUGH     Ear, Nose, and Throat:  Antitussives/Expectorants Passed - 07/10/2021  8:49 AM      Passed - Valid encounter within last 12 months    Recent Outpatient Visits           1 month ago Chronic fatigue   [place] Dennard Schaumann, Cammie Mcgee, MD   4 months ago Anemia, unspecified type   [place] [name], Cammie Mcgee, MD   4 months ago Anemia, unspecified type   [place] [name], MD   7 months ago Type 2 diabetes mellitus with other specified complication, unspecified whether long term insulin use ([place])   [place] [name], Cammie Mcgee, MD   8 months ago Chronic combined systolic and diastolic CHF (congestive heart failure) ([place])   [facility] [name], Cammie Mcgee, MD       Future Appointments             In 4 weeks Rice, Resa Miner, MD [place]   In 2 months  Buford Dresser, MD [facility]

## 2021-07-12 NOTE — Telephone Encounter (Signed)
predniSONE LOV 05/30/21 Last refill 05/01/21, #90, 0 refills  traMADol LOV 05/30/21 Last refill 04/11/21, #30, 3 refills  Please review, thanks!   This med benzonatate is not med list due it was written by different provider  Please advice

## 2021-07-12 NOTE — Patient Instructions (Signed)
Ebony Scott , Thank you for taking time to come for your Medicare Wellness Visit. I appreciate your ongoing commitment to your health goals. Please review the following plan we discussed and let me know if I can assist you in the future.   Screening recommendations/referrals: Colonoscopy: Done 04/04/2016 Repeat in 5 years  Mammogram: Done 10/06/2018 Repeat annually  Bone Density: Done 10/01/2018 Repeat every 2 years  Recommended yearly ophthalmology/optometry visit for glaucoma screening and checkup Recommended yearly dental visit for hygiene and checkup  Vaccinations: Influenza vaccine: Due Fall 2023. Pneumococcal vaccine: Done 05/18/2013 and 05/27/2017 Tdap vaccine: Done 08/18/2013 Repeat in 10 years  Shingles vaccine: Done 03/02/2008 and 09/18/2017.   Covid-19:Done 06/29/20, 12/16/2019, 04/01/2019, 03/12/2019  Advanced directives: Please bring a copy of your health care power of attorney and living will to the office to be added to your chart at your convenience.   Conditions/risks identified: Aim for 30 minutes of exercise or brisk walking, 6-8 glasses of water, and 5 servings of fruits and vegetables each day.  Next appointment: Follow up in one year for your annual wellness visit 2024.   Preventive Care 73 Years and Older, Female Preventive care refers to lifestyle choices and visits with your health care provider that can promote health and wellness. What does preventive care include? A yearly physical exam. This is also called an annual well check. Dental exams once or twice a year. Routine eye exams. Ask your health care provider how often you should have your eyes checked. Personal lifestyle choices, including: Daily care of your teeth and gums. Regular physical activity. Eating a healthy diet. Avoiding tobacco and drug use. Limiting alcohol use. Practicing safe sex. Taking low-dose aspirin every day. Taking vitamin and mineral supplements as recommended by your health care  provider. What happens during an annual well check? The services and screenings done by your health care provider during your annual well check will depend on your age, overall health, lifestyle risk factors, and family history of disease. Counseling  Your health care provider may ask you questions about your: Alcohol use. Tobacco use. Drug use. Emotional well-being. Home and relationship well-being. Sexual activity. Eating habits. History of falls. Memory and ability to understand (cognition). Work and work Statistician. Reproductive health. Screening  You may have the following tests or measurements: Height, weight, and BMI. Blood pressure. Lipid and cholesterol levels. These may be checked every 5 years, or more frequently if you are over 73 years old. Skin check. Lung cancer screening. You may have this screening every year starting at age 64 if you have a 30-pack-year history of smoking and currently smoke or have quit within the past 15 years. Fecal occult blood test (FOBT) of the stool. You may have this test every year starting at age 64. Flexible sigmoidoscopy or colonoscopy. You may have a sigmoidoscopy every 5 years or a colonoscopy every 10 years starting at age 51. Hepatitis C blood test. Hepatitis B blood test. Sexually transmitted disease (STD) testing. Diabetes screening. This is done by checking your blood sugar (glucose) after you have not eaten for a while (fasting). You may have this done every 1-3 years. Bone density scan. This is done to screen for osteoporosis. You may have this done starting at age 67. Mammogram. This may be done every 1-2 years. Talk to your health care provider about how often you should have regular mammograms. Talk with your health care provider about your test results, treatment options, and if necessary, the need for more  tests. Vaccines  Your health care provider may recommend certain vaccines, such as: Influenza vaccine. This is  recommended every year. Tetanus, diphtheria, and acellular pertussis (Tdap, Td) vaccine. You may need a Td booster every 10 years. Zoster vaccine. You may need this after age 97. Pneumococcal 13-valent conjugate (PCV13) vaccine. One dose is recommended after age 93. Pneumococcal polysaccharide (PPSV23) vaccine. One dose is recommended after age 83. Talk to your health care provider about which screenings and vaccines you need and how often you need them. This information is not intended to replace advice given to you by your health care provider. Make sure you discuss any questions you have with your health care provider. Document Released: 03/04/2015 Document Revised: 10/26/2015 Document Reviewed: 12/07/2014 Elsevier Interactive Patient Education  2017 [place] Prevention in the Home Falls can cause injuries. They can happen to people of all ages. There are many things you can do to make your home safe and to help prevent falls. What can I do on the outside of my home? Regularly fix the edges of walkways and driveways and fix any cracks. Remove anything that might make you trip as you walk through a door, such as a raised step or threshold. Trim any bushes or trees on the path to your home. Use bright outdoor lighting. Clear any walking paths of anything that might make someone trip, such as rocks or tools. Regularly check to see if handrails are loose or broken. Make sure that both sides of any steps have handrails. Any raised decks and porches should have guardrails on the edges. Have any leaves, snow, or ice cleared regularly. Use sand or salt on walking paths during winter. Clean up any spills in your garage right away. This includes oil or grease spills. What can I do in the bathroom? Use night lights. Install grab bars by the toilet and in the tub and shower. Do not use towel bars as grab bars. Use non-skid mats or decals in the tub or shower. If you need to sit down in  the shower, use a plastic, non-slip stool. Keep the floor dry. Clean up any water that spills on the floor as soon as it happens. Remove soap buildup in the tub or shower regularly. Attach bath mats securely with double-sided non-slip rug tape. Do not have throw rugs and other things on the floor that can make you trip. What can I do in the bedroom? Use night lights. Make sure that you have a light by your bed that is easy to reach. Do not use any sheets or blankets that are too big for your bed. They should not hang down onto the floor. Have a firm chair that has side arms. You can use this for support while you get dressed. Do not have throw rugs and other things on the floor that can make you trip. What can I do in the kitchen? Clean up any spills right away. Avoid walking on wet floors. Keep items that you use a lot in easy-to-reach places. If you need to reach something above you, use a strong step stool that has a grab bar. Keep electrical cords out of the way. Do not use floor polish or wax that makes floors slippery. If you must use wax, use non-skid floor wax. Do not have throw rugs and other things on the floor that can make you trip. What can I do with my stairs? Do not leave any items on the stairs. Make sure  that there are handrails on both sides of the stairs and use them. Fix handrails that are broken or loose. Make sure that handrails are as long as the stairways. Check any carpeting to make sure that it is firmly attached to the stairs. Fix any carpet that is loose or worn. Avoid having throw rugs at the top or bottom of the stairs. If you do have throw rugs, attach them to the floor with carpet tape. Make sure that you have a light switch at the top of the stairs and the bottom of the stairs. If you do not have them, ask someone to add them for you. What else can I do to help prevent falls? Wear shoes that: Do not have high heels. Have rubber bottoms. Are comfortable  and fit you well. Are closed at the toe. Do not wear sandals. If you use a stepladder: Make sure that it is fully opened. Do not climb a closed stepladder. Make sure that both sides of the stepladder are locked into place. Ask someone to hold it for you, if possible. Clearly mark and make sure that you can see: Any grab bars or handrails. First and last steps. Where the edge of each step is. Use tools that help you move around (mobility aids) if they are needed. These include: Canes. Walkers. Scooters. Crutches. Turn on the lights when you go into a dark area. Replace any light bulbs as soon as they burn out. Set up your furniture so you have a clear path. Avoid moving your furniture around. If any of your floors are uneven, fix them. If there are any pets around you, be aware of where they are. Review your medicines with your doctor. Some medicines can make you feel dizzy. This can increase your chance of falling. Ask your doctor what other things that you can do to help prevent falls. This information is not intended to replace advice given to you by your health care provider. Make sure you discuss any questions you have with your health care provider. Document Released: 12/02/2008 Document Revised: 07/14/2015 Document Reviewed: 03/12/2014 Elsevier Interactive Patient Education  2017 [employer].

## 2021-07-13 ENCOUNTER — Telehealth: Payer: Self-pay

## 2021-07-13 ENCOUNTER — Ambulatory Visit (INDEPENDENT_AMBULATORY_CARE_PROVIDER_SITE_OTHER): Payer: Medicare PPO

## 2021-07-13 VITALS — Ht 61.0 in | Wt 130.0 lb

## 2021-07-13 DIAGNOSIS — Z0000 Encounter for general adult medical examination without abnormal findings: Secondary | ICD-10-CM | POA: Diagnosis not present

## 2021-07-13 NOTE — Telephone Encounter (Signed)
Pt states she has been seeing commercials for the RSV vaccine and that persons 65 and older should receive the vaccine. I asked her, to be sure, that it wasn't the Shingrix and pt states no. Does she need the RSV vaccine? Thank you!

## 2021-07-13 NOTE — Progress Notes (Signed)
Subjective:   Ebony Scott is a 71 y.o. female who presents for Medicare Annual (Subsequent) preventive examination. Virtual Visit via Telephone Note  I connected with  [name] on 07/13/21 at  3:15 PM EDT by telephone and verified that I am speaking with the correct person using two identifiers.  Location: Patient: HOME Provider: [facility] Persons participating in the virtual visit: patient/Nurse Health Advisor   I discussed the limitations, risks, security and privacy concerns of performing an evaluation and management service by telephone and the availability of in person appointments. The patient expressed understanding and agreed to proceed.  Interactive audio and video telecommunications were attempted between this nurse and patient, however failed, due to patient having technical difficulties OR patient did not have access to video capability.  We continued and completed visit with audio only.  Some vital signs may be absent or patient reported.   Chriss Driver, LPN  Review of Systems     Cardiac Risk Factors include: advanced age (>16mn, >>30women);sedentary lifestyle;diabetes mellitus;dyslipidemia;Other (see comment), Risk factor comments: CHF     Objective:    Today's Vitals   07/13/21 1517 07/13/21 1518  Weight: 130 lb (59 kg)   Height: '5\' 1"'$  (1.549 m)   PainSc:  5    Body mass index is 24.56 kg/m.     07/13/2021    3:27 PM 02/05/2021   12:00 PM 02/05/2021    8:00 AM 02/04/2021   10:54 AM 01/11/2021    7:33 AM 10/04/2020    5:13 PM 09/22/2020    3:35 PM  Advanced Directives  Does Patient Have a Medical Advance Directive? Yes  Yes No No Yes No  Type of AParamed[place]of [place]Living will [place] will    [place] will   Does patient want to make changes to medical advance directive?  No - Patient declined No - Patient declined   No - Patient declined   Copy of [place]in Chart? No - copy requested     No - copy requested   Would patient like information on creating a medical advance directive?   No - Patient declined  No - Patient declined No - Patient declined No - Patient declined    Current Medications (verified) Outpatient Encounter Medications as of 07/13/2021  Medication Sig   acetaminophen (TYLENOL) 650 MG CR tablet Take 1,300 mg by mouth every 8 (eight) hours as needed for pain.   albuterol (PROVENTIL) (2.5 MG/3ML) 0.083% nebulizer solution Take 3 mLs (2.5 mg total) by nebulization every 4 (four) hours as needed for wheezing or shortness of breath.   albuterol (VENTOLIN HFA) 108 (90 Base) MCG/ACT inhaler Inhale 2 puffs into the lungs every 6 (six) hours as needed for wheezing or shortness of breath.   allopurinol (ZYLOPRIM) 100 MG tablet Take 1 tablet (100 mg total) by mouth daily.   Alpha-D-Galactosidase (BEANO PO) Take 2-3 tablets by mouth 3 (three) times daily with meals.   Alpha-Lipoic Acid 600 MG TABS Take 600 mg by mouth daily.   atorvastatin (LIPITOR) 80 MG tablet Take 1 tablet (80 mg total) by mouth daily.   azelastine (ASTELIN) 0.1 % nasal spray Place 2 sprays into both nostrils daily. Use in each nostril as directed   benzonatate (TESSALON) 200 MG capsule TAKE 1 CAPSULE BY MOUTH THREE TIMES DAILY AS NEEDED FOR COUGH   budesonide (PULMICORT) 0.5 MG/2ML nebulizer solution Take 2 mLs (  0.5 mg total) by nebulization 2 (two) times daily as needed.   CALCIUM-MAGNESIUM PO Take 1 tablet by mouth every evening.   cetirizine (ZYRTEC) 10 MG tablet Take 1 tablet (10 mg total) by mouth 2 (two) times daily as needed for allergies (Can take a n extra dose during flare ups.).   cholecalciferol (VITAMIN D) 25 MCG (1000 UNIT) tablet Take 1,000 Units by mouth every evening.   clopidogrel (PLAVIX) 75 MG tablet Take 1 tablet (75 mg total) by mouth daily.   cyproheptadine (PERIACTIN) 4 MG tablet Take 2 tablets (8 mg total) by mouth at bedtime. Take 2  tablets at bedtime as needed to treat facial pain or sleep dysfunction.   denosumab (PROLIA) 60 MG/ML SOSY injection Inject 60 mg into the skin every 6 (six) months.   dexlansoprazole (DEXILANT) 60 MG capsule Take 1 capsule (60 mg total) by mouth daily.   diclofenac Sodium (VOLTAREN) 1 % GEL APPLY 2 GRAMS TOPICALLY 4 TIMES DAILY   empagliflozin (JARDIANCE) 10 MG TABS tablet Take 1 tablet (10 mg total) by mouth daily before breakfast.   EPINEPHrine 0.3 mg/0.3 mL IJ SOAJ injection USE AS DIRECTED BY YOUR PHYSICIAN INTRAMUSCULARLY AS NEEDED FOR ANAPHYLAXIS   famotidine (PEPCID) 20 MG tablet Take 1 tablet (20 mg total) by mouth at bedtime.   fluticasone (FLONASE) 50 MCG/ACT nasal spray USE 2 SPRAYS IN EACH NOSTRIL DAILY (Patient taking differently: 2 sprays daily as needed for allergies.)   folic acid (FOLVITE) 1 MG tablet Take 1 tablet (1 mg total) by mouth daily.   furosemide (LASIX) 40 MG tablet Take 1 tablet (40 mg total) by mouth daily. Take 1 table twice a day for 3 days and then back to once daily.   GAMUNEX-C 10 GM/100ML SOLN    Glucose Blood (BLOOD GLUCOSE TEST STRIPS) STRP Please dispense per patient and insurance preference. Use as directed to monitor FSBS 4x . Dx: E11.65   guaiFENesin (MUCINEX) 600 MG 12 hr tablet Take 1 tablet (600 mg total) by mouth 2 (two) times daily as needed for cough or to loosen phlegm. (Patient taking differently: Take 600 mg by mouth 2 (two) times daily.)   isosorbide-hydrALAZINE (BIDIL) 20-37.5 MG tablet Take 1 tablet by mouth every evening.   Lactase (LACTOSE FAST ACTING RELIEF PO) Take 2-3 tablets by mouth in the morning, at noon, and at bedtime.   lidocaine-prilocaine (EMLA) cream Apply 1 application. topically as needed (pain).   LORazepam (ATIVAN) 0.5 MG tablet Take 1 tablet (0.5 mg total) by mouth 3 times/day as needed-between meals & bedtime for sleep.   magnesium oxide (MAG-OX) 400 MG tablet Take 400 mg by mouth every evening.   Menthol, Topical  Analgesic, (BIOFREEZE) 4 % GEL Apply 1 application. topically daily as needed (pain).   montelukast (SINGULAIR) 10 MG tablet Take 1 tablet (10 mg total) by mouth daily. (Patient taking differently: Take 10 mg by mouth every evening.)   NEOMYCIN-POLYMYXIN-HYDROCORTISONE (CORTISPORIN) 1 % SOLN OTIC solution INSTILL 3 DROPS INTO THE LEFT EAR 4 TIMES DAILY   Nystatin (GERHARDT'S BUTT CREAM) CREA Apply topically 3 times a day as needed for itching or irritation   nystatin (MYCOSTATIN) 100000 UNIT/ML suspension Use as directed 15 mLs in the mouth or throat at bedtime as needed (mouth sores).   nystatin ointment (MYCOSTATIN) Apply 1 application topically 2 (two) times daily as needed (yeast).   pantoprazole (PROTONIX) 40 MG tablet Take 1 tablet (40 mg total) by mouth daily.   potassium chloride SA (KLOR-CON  M) 20 MEQ tablet Take 20 mEq by mouth daily.   predniSONE (DELTASONE) 1 MG tablet TAKE 2 TABLETS BY MOUTH DAILY WITH BREAKFAST.Marland KitchenMarland KitchenTAKE WITH '10MG'$  TO EQUAL '12MG'$ DAILY   predniSONE (DELTASONE) 5 MG tablet Take 1 tablet (5 mg total) by mouth daily with breakfast. Along with 2 mg tablets for total daily dose 7 mg total daily   Prenatal Vit-Fe Fumarate-FA (PRENATAL VITAMINS PO) Take 1 tablet by mouth daily.   Probiotic Product (ALIGN PO) Take 1 capsule by mouth daily.   Propylene Glycol (SYSTANE COMPLETE) 0.6 % SOLN Place 1 drop into both eyes 2 (two) times daily.   Tiotropium Bromide-Olodaterol (STIOLTO RESPIMAT) 2.5-2.5 MCG/ACT AERS Inhale 2 puffs into the lungs daily. (Patient taking differently: Inhale 2 puffs into the lungs at bedtime.)   traMADol (ULTRAM) 50 MG tablet TAKE 1 TABLET BY MOUTH DAILY AT BEDTIME   triamcinolone ointment (KENALOG) 0.1 % Apply topically 2 (two) times daily.   XARELTO 20 MG TABS tablet TAKE 1 TABLET BY MOUTH DAILY WITH SUPPER   [DISCONTINUED] predniSONE (DELTASONE) 1 MG tablet Take 2 tablets (2 mg total) by mouth as directed. Take along with 5 mg tablet=7 mg   [DISCONTINUED]  traMADol (ULTRAM) 50 MG tablet TAKE 1 TABLET BY MOUTH DAILY AT BEDTIME   Facility-Administered Encounter Medications as of 07/13/2021  Medication   iohexol (OMNIPAQUE) 350 MG/ML injection    Allergies (verified) Baclofen, Dust mite extract, Molds & smuts, Morphine and related, Other, Penicillins, Rofecoxib, Shellfish allergy, Tetracycline hcl, Xolair [omalizumab], Zoledronic acid, Dilaudid [hydromorphone hcl], Levofloxacin, Oxycodone hcl, Paroxetine, Celecoxib, Diltiazem hcl, Hydrocodone-acetaminophen, Lactose intolerance (gi), Morphine sulfate, Oxycodone-acetaminophen, Rituximab, Tree extract, Immune globulin, and Penicillin g procaine   History: Past Medical History:  Diagnosis Date   Abscess of dorsum of right hand 09/22/2020   Acute deep vein thrombosis (DVT) of right lower extremity ([place]) 12/13/2017   Allergic rhinitis    Anemia    Angio-edema    Anxiety    pt denies dx   Arthritis    Phreesia 10/21/2019   Asthma    Phreesia 10/21/2019   Breast cancer ([place]) 1998   Left breast, in remission   Cataract    REMOVED   CHF (congestive heart failure) (HCC)    Clostridium difficile colitis 58/52/7782   Complication of anesthesia    "had hard time waking up from it several times" (02/20/2012)   Depression    "some; don't take anything for it" (02/20/2012), pt denies dx as of 06/02/21   Diverticulosis    DVT (deep venous thrombosis) (HCC)    Dyspnea    all the time   Fibromyalgia 11/2011   GERD (gastroesophageal reflux disease)    Graves disease    Headache(784.0)    "related to allergies; more at different times during the year" (02/20/2012)   Hemorrhoids    Hiatal hernia    back and neck   History of blood transfusion 2023   1 unit, 5 units of Iron   Hx of adenomatous colonic polyps 04/12/2016   Hypercholesteremia    good cholesterol is high   Hypothyroidism    IBS (irritable bowel syndrome)    Moderate persistent asthma    -FeV1 72% 2011, -IgE 102 2011, CT sinus Neg 2011    Osteoporosis    on reclast yearly   Peripheral vascular disease ([place]) 2019   DVTs (3) lungs and leg   Pneumonia 04/2011; ~ 11/2011   "double; single" (02/20/2012)   Septic olecranon bursitis of right elbow 09/22/2020  Seronegative rheumatoid arthritis ([place])    Dr. Lahoma Rocker   SIRS (systemic inflammatory response syndrome) ([place]) 02/10/2018   Sleep apnea    uses cpap nightly   Tracheobronchomalacia    Past Surgical History:  Procedure Laterality Date   ABDOMINAL HYSTERECTOMY N/A    Phreesia 10/21/2019   ANTERIOR AND POSTERIOR REPAIR  1990's   APPENDECTOMY     ARGON LASER APPLICATION  93/73/4287   Procedure: ARGON LASER APPLICATION;  Surgeon: Yetta Flock, MD;  Location: [facility];  Service: Gastroenterology;;   BREAST LUMPECTOMY  1998   left   BREAST SURGERY N/A    Phreesia 10/21/2019   BRONCHIAL WASHINGS  04/05/2020   Procedure: BRONCHIAL WASHINGS;  Surgeon: Freddi Starr, MD;  Location: [facility];  Service: Pulmonary;;   CARPOMETACARPEL ([place]) FUSION OF THUMB WITH AUTOGRAFT FROM RADIUS  ~ 2009   "both thumbs" (02/20/2012)   CATARACT EXTRACTION W/ INTRAOCULAR LENS  IMPLANT, BILATERAL  2012   CERVICAL DISCECTOMY  10/2001   C5-C6   CERVICAL FUSION  2003   C3-C4   CHOLECYSTECTOMY     COLONOSCOPY     CORONARY STENT INTERVENTION N/A 01/11/2021   Procedure: CORONARY STENT INTERVENTION;  Surgeon: Leonie Man, MD;  Location: [facility];  Service: Cardiovascular;  Laterality: N/A;   DEBRIDEMENT TENNIS ELBOW  ?1970's   right   ESOPHAGOGASTRODUODENOSCOPY     ESOPHAGOGASTRODUODENOSCOPY (EGD) WITH PROPOFOL N/A 02/05/2021   Procedure: ESOPHAGOGASTRODUODENOSCOPY (EGD) WITH PROPOFOL;  Surgeon: Yetta Flock, MD;  [facility];  Service: Gastroenterology;  Laterality: N/A;   EYE SURGERY N/A    Phreesia 10/21/2019   HYSTERECTOMY     I & D EXTREMITY Right 09/22/2020   Procedure: IRRIGATION AND DEBRIDEMENT RIGHT HAND AND ELBOW;  Surgeon:  Marchia Bond, MD;  Location: [facility];  Service: Orthopedics;  Laterality: Right;   KNEE ARTHROPLASTY  ?1990's   "?right; w/cartilage repair" (02/20/2012)   NASAL SEPTUM SURGERY  1980's   POSTERIOR CERVICAL FUSION/FORAMINOTOMY  2004   "failed initial fusion; rewired  anterior neck" (02/20/2012)   REVERSE SHOULDER ARTHROPLASTY Right 02/16/2020   Procedure: REVERSE SHOULDER ARTHROPLASTY;  Surgeon: Marchia Bond, MD;  Location: [facility];  Service: Orthopedics;  Laterality: Right;   RIGHT/LEFT HEART CATH AND CORONARY ANGIOGRAPHY N/A 01/11/2021   Procedure: RIGHT/LEFT HEART CATH AND CORONARY ANGIOGRAPHY;  Surgeon: Leonie Man, MD;  Location: [facility];  Service: Cardiovascular;  Laterality: N/A;   SPINE SURGERY N/A    Phreesia 10/21/2019   TONSILLECTOMY  ~ 1953   VESICOVAGINAL FISTULA CLOSURE W/ TAH  1988   VIDEO BRONCHOSCOPY Bilateral 08/23/2016   Procedure: VIDEO BRONCHOSCOPY WITH FLUORO;  Surgeon: Javier Glazier, MD;  [facility];  Service: Cardiopulmonary;  Laterality: Bilateral;   VIDEO BRONCHOSCOPY N/A 04/05/2020   Procedure: VIDEO BRONCHOSCOPY WITHOUT FLUORO;  Surgeon: Freddi Starr, MD;  [facility];  Service: Pulmonary;  Laterality: N/A;   Family History  Problem Relation Age of Onset   Allergies Mother    Heart disease Mother    Arthritis Mother    Lung cancer Mother        heavy smoker   Diabetes Mother    Allergies Father    Heart disease Father    Arthritis Father    Stroke Father    Heart disease Brother    Diabetes Maternal Grandfather    Colitis Daughter    Colon cancer Other        Maternal half  uncle   Social History   Socioeconomic History   Marital status: Divorced    Spouse name: Not on file   Number of children: 2   Years of education: college   Highest education level: Not on file  Occupational History   Occupation: Disabled    Comment: Retired Engineer, production: RETIRED  Tobacco Use    Smoking status: Never    Passive exposure: Yes   Smokeless tobacco: Never   Tobacco comments:    Parents  Vaping Use   Vaping Use: Never used  Substance and Sexual Activity   Alcohol use: Not Currently    Alcohol/week: 0.0 standard drinks   Drug use: No   Sexual activity: Not Currently    Birth control/protection: Surgical    Comment: Hysterectomy  Other Topics Concern   Not on file  Social History Narrative   Patient lives at home alone. Patient  divorced.    Patient has her BS degree.   Right handed.   Caffeine- sometimes coffee.      [facility]:   Born in [place], [place]. She worked as a Copywriter, advertising. She has no pets currently. She does have indoor plants. Previously had mold in her home that was remediated. Carpet was removed.          Social Determinants of Health   Financial Resource Strain: Not on file  Food Insecurity: No Food Insecurity   Worried About Charity fundraiser in the Last Year: Never true   Ran Out of Food in the Last Year: Never true  Transportation Needs: No Transportation Needs   Lack of Transportation (Medical): No   Lack of Transportation (Non-Medical): No  Physical Activity: Insufficiently Active   Days of Exercise per Week: 5 days   Minutes of Exercise per Session: 20 min  Stress: No Stress Concern Present   Feeling of Stress : Only a little  Social Connections: Moderately Isolated   Frequency of Communication with Friends and Family: More than three times a week   Frequency of Social Gatherings with Friends and Family: More than three times a week   Attends Religious Services: 1 to 4 times per year   Active Member of [employer] or Organizations: No   Attends Music therapist: Never   Marital Status: Divorced    Tobacco Counseling Counseling given: Not Answered Tobacco comments: Parents   Clinical Intake:  Pre-visit preparation completed: Yes  Pain : 0-10 Pain Score: 5  Pain Type: Chronic pain Pain Location:  Knee Pain Orientation: Left Pain Descriptors / Indicators: Aching, Dull Pain Onset: More than a month ago Pain Frequency: Intermittent     BMI - recorded: 24.56 Nutritional Status: BMI of 19-24  Normal Nutritional Risks: None Diabetes: Yes  How often do you need to have someone help you when you read instructions, pamphlets, or other written materials from your doctor or pharmacy?: 1 - Never  Diabetic?Nutrition Risk Assessment:  Has the patient had any N/V/D within the last 2 months?  No  Does the patient have any non-healing wounds?  No  Has the patient had any unintentional weight loss or weight gain?  No   Diabetes:  Is the patient diabetic?  Yes  If diabetic, was a CBG obtained today?  No  Did the patient bring in their glucometer from home?  No  How often do you monitor your CBG's? Does not check.   Financial Strains and Diabetes Management:  Are you  having any financial strains with the device, your supplies or your medication? No .  Does the patient want to be seen by Chronic Care Management for management of their diabetes?  No  Would the patient like to be referred to a Nutritionist or for Diabetic Management?  No   Diabetic Exams:  Diabetic Eye Exam: Completed 05/23/2021.Marland Kitchen  Pt has been advised about the importance in completing this exam.  Diabetic Foot Exam: Completed DUE. Pt has been advised about the importance in completing this exam.  Interpreter Needed?: No  Information entered by :: mj Taylan Mayhan,lpn   Activities of Daily Living    07/13/2021    3:28 PM 02/05/2021    8:00 AM  In your present state of health, do you have any difficulty performing the following activities:  Hearing? 0 0  Vision? 0 0  Difficulty concentrating or making decisions? 0 0  Walking or climbing stairs? 0 1  Dressing or bathing? 0 1  Doing errands, shopping? 0 0  Preparing Food and eating ? N   Using the Toilet? N   In the past six months, have you accidently leaked urine? Y    Do you have problems with loss of bowel control? N   Managing your Medications? N   Managing your Finances? N   Housekeeping or managing your Housekeeping? N     Patient Care Team: [name], MD as PCP - General (Family Medicine) Buford Dresser, MD as PCP - Cardiology (Cardiology)  Indicate any recent Medical Services you may have received from other than [facility] providers in the past year (date may be approximate).     Assessment:   This is a routine wellness examination for Seraiah.  Hearing/Vision screen Hearing Screening - Comments:: No hearing issues.  Vision Screening - Comments:: Readers. Dr. Mancel Bale in [place]. 05/23/2021.  Dietary issues and exercise activities discussed: Current Exercise Habits: The patient does not participate in regular exercise at present, Exercise limited by: cardiac condition(s);orthopedic condition(s)   Goals Addressed             This Visit's Progress    Patient Stated   Not on track    I would like to be able to walk better.     Prevent falls   On track      Depression Screen    07/13/2021    3:22 PM 08/05/2020    3:09 PM 07/07/2020   10:55 AM 03/27/2018    9:17 AM 02/25/2017   10:37 AM 08/20/2016   11:19 AM 08/20/2016   10:29 AM  PHQ 2/9 Scores  PHQ - 2 Score 0 0 0 0 2 0 1  PHQ- 9 Score     9  2    Fall Risk    07/13/2021    3:27 PM 10/27/2020   10:40 AM 08/05/2020    3:09 PM 07/07/2020   10:53 AM 03/21/2020    2:15 PM  Fall Risk   Falls in the past year? 0 0 0 1 1  Number falls in past yr: 0 0 0 1 1  Injury with Fall? 0 0 0 0 1  Risk for fall due to : Impaired balance/gait Impaired mobility  History of fall(s);Impaired balance/gait   Follow up Falls prevention discussed  Falls evaluation completed Falls evaluation completed;Falls prevention discussed     FALL RISK PREVENTION PERTAINING TO THE HOME:  Any stairs in or around the home? Yes  If so, are there any without handrails? No  Home free of loose throw rugs in  walkways, pet beds, electrical cords, etc? Yes  Adequate lighting in your home to reduce risk of falls? Yes   ASSISTIVE DEVICES UTILIZED TO PREVENT FALLS:  Life alert? Yes  Use of a cane, walker or w/c? Yes  Grab bars in the bathroom? Yes  Shower chair or bench in shower? Yes  Elevated toilet seat or a handicapped toilet? Yes   TIMED UP AND GO:  Was the test performed? No .  Phone visit.  Cognitive Function:        07/13/2021    3:29 PM  6CIT Screen  What Year? 0 points  What month? 0 points  What time? 0 points  Count back from 20 0 points  Months in reverse 0 points  Repeat phrase 0 points  Total Score 0 points    Immunizations Immunization History  Administered Date(s) Administered   DTaP 08/18/2013   Fluad Quad(high Dose 65+) 10/16/2018, 11/20/2019, 11/03/2020   Hepatitis A 09/04/2007, 03/02/2008   Hepatitis B 01/07/1985, 02/06/1985, 08/17/1985   Influenza Split 11/13/2010, 11/22/2011, 10/20/2012   Influenza Whole 11/14/2009, 11/21/2011   Influenza, High Dose Seasonal PF 11/09/2015, 11/27/2017   Influenza, Quadrivalent, Recombinant, Inj, Pf 11/20/2019   Influenza,inj,Quad PF,6+ Mos 11/06/2013, 11/09/2014, 11/06/2016   Meningococcal Conjugate 09/04/2007   PFIZER(Purple Top)SARS-COV-2 Vaccination 03/12/2019, 04/01/2019, 12/16/2019, 06/29/2020   Pfizer Covid-19 Vaccine Bivalent Booster 57yr & up 03/02/2021   Pneumococcal Conjugate-13 05/18/2013   Pneumococcal Polysaccharide-23 01/05/1994, 11/28/2011, 05/27/2017   Td 07/24/1995, 03/16/2005   Tdap 08/18/2013   Zoster Recombinat (Shingrix) 09/18/2017   Zoster, Live 03/02/2008    TDAP status: Up to date  Flu Vaccine status: Up to date  Pneumococcal vaccine status: Up to date  Covid-19 vaccine status: Completed vaccines  Qualifies for Shingles Vaccine? Yes   Zostavax completed Yes   Shingrix Completed?: No.    Education has been provided regarding the importance of this vaccine. Patient has been advised to  call insurance company to determine out of pocket expense if they have not yet received this vaccine. Advised may also receive vaccine at local pharmacy or Health Dept. Verbalized acceptance and understanding.  Screening Tests Health Maintenance  Topic Date Due   URINE MICROALBUMIN  Never done   MAMMOGRAM  10/19/2021 (Originally 10/06/2019)   FOOT EXAM  02/16/2022 (Originally 02/26/1960)   OPHTHALMOLOGY EXAM  02/16/2022 (Originally 02/26/1960)   Zoster Vaccines- Shingrix (2 of 2) 02/16/2022 (Originally 11/13/2017)   COLONOSCOPY (Pts [phone] coverage will need to be confirmed)  07/19/2022 (Originally 04/04/2021)   HEMOGLOBIN A1C  08/06/2021   INFLUENZA VACCINE  09/19/2021   TETANUS/TDAP  08/19/2023   Pneumonia Vaccine 6567Years old  Completed   DEXA SCAN  Completed   COVID-19 Vaccine  Completed   Hepatitis C Screening  Completed   HPV VACCINES  Aged Out    Health Maintenance  Health Maintenance Due  Topic Date Due   URINE MICROALBUMIN  Never done    Colorectal cancer screening: Type of screening: Colonoscopy. Completed 04/04/2016. Repeat every 5 years  Mammogram status: Completed 10/05/2020. Repeat every year  Bone Density status: Completed 09/30/2020. Results reflect: Bone density results: OSTEOPENIA. Repeat every 2 years.  Lung Cancer Screening: (Low Dose CT Chest recommended if Age 73-80ears, 30 pack-year currently smoking OR have quit w/in 15years.) does not qualify.   Additional Screening:  Hepatitis C Screening: does not qualify; Completed 01/21/2018  Vision Screening: Recommended annual ophthalmology exams for early detection of glaucoma and  other disorders of the eye. Is the patient up to date with their annual eye exam?  Yes  Who is the provider or what is the name of the office in which the patient attends annual eye exams? Dr. Mancel Bale in [place]. If pt is not established with a provider, would they like to be referred to a provider to establish care? No .    Dental Screening: Recommended annual dental exams for proper oral hygiene  Community Resource Referral / Chronic Care Management: CRR required this visit?  No   CCM required this visit?  No      Plan:     I have personally reviewed and noted the following in the patient's chart:   Medical and social history Use of alcohol, tobacco or illicit drugs  Current medications and supplements including opioid prescriptions.  Functional ability and status Nutritional status Physical activity Advanced directives List of other physicians Hospitalizations, surgeries, and ER visits in previous 12 months Vitals Screenings to include cognitive, depression, and falls Referrals and appointments  In addition, I have reviewed and discussed with patient certain preventive protocols, quality metrics, and best practice recommendations. A written personalized care plan for preventive services as well as general preventive health recommendations were provided to patient.     Chriss Driver, LPN   [id number]/[id number]   Nurse Notes: Discussed Shingrix and how to obtain.

## 2021-07-15 ENCOUNTER — Encounter (HOSPITAL_BASED_OUTPATIENT_CLINIC_OR_DEPARTMENT_OTHER): Payer: Self-pay

## 2021-07-26 DIAGNOSIS — S46012A Strain of muscle(s) and tendon(s) of the rotator cuff of left shoulder, initial encounter: Secondary | ICD-10-CM | POA: Diagnosis not present

## 2021-07-28 DIAGNOSIS — J45901 Unspecified asthma with (acute) exacerbation: Secondary | ICD-10-CM | POA: Diagnosis not present

## 2021-07-28 DIAGNOSIS — R059 Cough, unspecified: Secondary | ICD-10-CM | POA: Diagnosis not present

## 2021-07-28 DIAGNOSIS — J479 Bronchiectasis, uncomplicated: Secondary | ICD-10-CM | POA: Diagnosis not present

## 2021-07-28 DIAGNOSIS — J398 Other specified diseases of upper respiratory tract: Secondary | ICD-10-CM | POA: Diagnosis not present

## 2021-07-31 ENCOUNTER — Telehealth: Payer: Self-pay

## 2021-07-31 NOTE — Telephone Encounter (Signed)
Sarah/[employer] - DOB verified - came into the office to drop off the first Bronchiectasis Patient Outcomes report on the patient since she began using the vibrating vest.   Per Judson Roch - patient reports compliance w/the device and has been able to stay out of the hospital. Judson Roch stated [employer] will continue to have a close follow up with patient.  Forwarding message to provider - patient's outcomes report on his desk for review.

## 2021-08-01 ENCOUNTER — Telehealth: Payer: Self-pay | Admitting: Urology

## 2021-08-01 NOTE — Telephone Encounter (Signed)
DOS  - 06/05/21   HAMMERTOE REPAIR 2ND RIGHT --- 11216 CAPSULOTOMY, MPJ RELEASE 2ND RIGHT --- 28270 EXCISION OF SOFT TISSUE MASS LEFT --- 24469   [employer] EFFECTIVE DATE - 02/20/19     PER [facility] [employer] CPT CODES 50722 AND 57505 NO PRIOR AUTH IS REQUIRED. FOR CPT CODE 18335 HAS BEEN APPROVED, AUTH # [id number], GOOD FROM 08/28/21 - 11/26/21   TRACKING # [id number]

## 2021-08-02 ENCOUNTER — Encounter: Payer: Self-pay | Admitting: Allergy and Immunology

## 2021-08-08 ENCOUNTER — Other Ambulatory Visit: Payer: Self-pay | Admitting: Family Medicine

## 2021-08-08 NOTE — Telephone Encounter (Signed)
LOV 05/30/21 Last refill 07/13/21, #60, 0 refills  Please review, thanks!

## 2021-08-08 NOTE — Telephone Encounter (Signed)
Requested medication (s) are due for refill today:   Provider to review  Requested medication (s) are on the active medication list:   Yes  Future visit scheduled:   Yes in 6 days   Last ordered: 07/13/2021 #60, 0 refills  Returned because this is a non delegated refill   Requested Prescriptions  Pending Prescriptions Disp Refills   predniSONE (DELTASONE) 1 MG tablet [Pharmacy Med Name: prednisone 1 mg tablet] 60 tablet 0    Sig: [place]... TAKE WITH 10 MG FOR A TOTAL OF 12 MG DAILY     Not Delegated - Endocrinology:  Oral Corticosteroids Failed - 08/08/2021  2:40 PM      Failed - This refill cannot be delegated      Failed - Manual Review: Eye exam for IOP if prolonged treatment      Failed - Glucose (serum) in normal range and within 180 days    Glucose, Bld  Date Value Ref Range Status  05/24/2021 196 (H) 70 - 99 mg/dL Final    Comment:    Glucose reference range applies only to samples taken after fasting for at least 8 hours.   Glucose-Capillary  Date Value Ref Range Status  02/07/2021 183 (H) 70 - 99 mg/dL Final    Comment:    Glucose reference range applies only to samples taken after fasting for at least 8 hours.         Failed - Bone Mineral Density or Dexa Scan completed in the last 2 years      62 - K in normal range and within 180 days    Potassium  Date Value Ref Range Status  05/24/2021 4.4 3.5 - 5.1 mmol/L Final         Passed - Na in normal range and within 180 days    Sodium  Date Value Ref Range Status  05/24/2021 140 135 - 145 mmol/L Final  02/03/2021 141 134 - 144 mmol/L Final         Passed - Last BP in normal range    BP Readings from Last 1 Encounters:  06/14/21 110/60         Passed - Valid encounter within last 6 months    Recent Outpatient Visits           2 months ago Chronic fatigue   [place] Dennard Schaumann, Cammie Mcgee, MD   5 months ago Anemia, unspecified type   [place] Dennard Schaumann, Cammie Mcgee, MD   5 months ago Anemia, unspecified type   [place] [name], MD   8 months ago Type 2 diabetes mellitus with other specified complication, unspecified whether long term insulin use ([place])   [place] [name], Cammie Mcgee, MD   9 months ago Chronic combined systolic and diastolic CHF (congestive heart failure) ([place])   [place] [name], Cammie Mcgee, MD       Future Appointments             Tomorrow Collier Salina, MD [facility]   In 6 days [name], Cammie Mcgee, MD [place] [place]   In 1 month Buford Dresser, MD [facility]

## 2021-08-09 ENCOUNTER — Ambulatory Visit: Payer: Medicare PPO | Admitting: Rheumatology

## 2021-08-09 ENCOUNTER — Telehealth: Payer: Self-pay | Admitting: Internal Medicine

## 2021-08-09 ENCOUNTER — Ambulatory Visit: Payer: Medicare PPO | Admitting: Internal Medicine

## 2021-08-09 ENCOUNTER — Encounter: Payer: Self-pay | Admitting: Internal Medicine

## 2021-08-09 VITALS — BP 143/69 | HR 66 | Resp 12 | Ht 61.0 in | Wt 136.2 lb

## 2021-08-09 DIAGNOSIS — M25462 Effusion, left knee: Secondary | ICD-10-CM | POA: Diagnosis not present

## 2021-08-09 DIAGNOSIS — M1120 Other chondrocalcinosis, unspecified site: Secondary | ICD-10-CM

## 2021-08-09 DIAGNOSIS — M0600 Rheumatoid arthritis without rheumatoid factor, unspecified site: Secondary | ICD-10-CM

## 2021-08-09 LAB — SYNOVIAL FLUID ANALYSIS, COMPLETE
Basophils, %: 0 %
Eosinophils-Synovial: 0 % (ref 0–2)
Lymphocytes-Synovial Fld: 54 % (ref 0–74)
Monocyte/Macrophage: 27 % (ref 0–69)
Neutrophil, Synovial: 19 % (ref 0–24)
Synoviocytes, %: 0 % (ref 0–15)
WBC, Synovial: 176 cells/uL — ABNORMAL HIGH (ref <150)

## 2021-08-09 NOTE — Progress Notes (Signed)
Office Visit Note  Patient: Ebony Scott             Date of Birth: 10-14-1950           MRN: [id number]             PCP: [name], MD Referring: [name], MD Visit Date: 08/09/2021   Subjective:   History of Present Illness: Ebony Scott is a 71 y.o. female here for follow up or seronegative RA and cushing's syndrome on prednisone with gradual tapering but increased back to 7 mg daily after last visit.  She is having some increased joint pain at multiple areas but worst problem is increased pain in her left knee.  This is severe enough to limit her tolerance for weightbearing and mobility.  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  She has not had any severe interval infections.  She is scheduled for hammertoe corrective surgery next month.  Previous HPI 04/28/21 Ebony Scott is a 71 y.o. female here for follow up for seronegative RA and cushing's syndrome on prednisone with gradual tapering from 8 mg in December. She developed worsening iron deficiency anemia after GI bleed getting venofer replacement. Joint and skin problems were remaining pretty stable until about 2 weeks ot a month ago. This was after decreasing prednisone to 6 mg daily dose. Knee pain limits her mobility but hands have the most increase in swelling. Skin rash on the back of her neck and throughout her low back. She tried topical hydrocortisone without a large improvement so far.   Previous HPI 01/23/21 Ebony Scott is a 71 y.o. female here for follow up for seronegative RA minimally responsive to multiple DMARD treatments on prednisone 10 mg PO daily with acquired cushing's syndrome. She has noticed some increase in right hand MCP joint swelling but overall joints not dramatically changed with dose reduction. Since our visit she went for coronary artery stent placement after study for evaluating cardiomyopathy found significant stenosis. No anginal symptoms, she  is not sure if any different but husband reports noticing some improvement in her dyspnea on exertion. She has had numerous other issues increased upper airway congestion and drainage. Sometimes productive coughing with this. She has had mild nasal bleeding with rhinitis and and sneezing issues.   Previous HPI 11/28/20 Ebony Scott is a 71 y.o. female here for iatrogenic cushing syndrome and history of rheumatoid arthritis with long term prednisone treatment, previously seen by Dr. Kathlene November at [facility] for this problem. Mostly had been taking treatments after hospitalization with severe joint inflammation in 2020. She has tried taking numerous DMARD treatments for years including actemra, simponi, abatacept, and rituximab so far without great response or tolerance. Chronic prednisone required for symptoms throughout this time but has taken these longstanding for asthma disease already. She has had some issue with recurrent infections with pulmonary and septic bursitis and more recently noninfectious allergy symptoms managed with Dr. Neldon Mc and Erin Fulling. She started on IVIG treatments with Dr. Neldon Mc for this identified recently, not sure if primary or related to previous medication   DMARD Hx Rituximab Abatacept Simponi Actemra IV   Review of Systems  Constitutional:  Positive for fatigue.  HENT:  Positive for mouth dryness.   Eyes:  Positive for dryness.  Respiratory:  Positive for shortness of breath.   Cardiovascular:  Positive for swelling in legs/feet.  Gastrointestinal:  Negative for constipation.  Endocrine: Positive for cold intolerance and  increased urination.  Genitourinary:  Negative for difficulty urinating.  Musculoskeletal:  Positive for joint pain, gait problem, joint pain, joint swelling, muscle weakness, morning stiffness and muscle tenderness.  Skin:  Negative for rash.  Allergic/Immunologic: Negative for susceptible to infections.  Neurological:  Positive for weakness.   Hematological:  Positive for bruising/bleeding tendency.  Psychiatric/Behavioral:  Positive for sleep disturbance.     PMFS History:  Patient Active Problem List   Diagnosis Date Noted   Effusion, left knee 08/09/2021   Noninfectious otitis externa of left ear 06/08/2021   Rash and other nonspecific skin eruption 04/28/2021   Iron deficiency anemia 02/24/2021   GAVE (gastric antral vascular ectasia)    Symptomatic anemia 02/04/2021   Anticoagulated    Antiplatelet or antithrombotic long-term use    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[place])  01/11/2021   Coronary artery calcification seen on CAT scan 01/11/2021   ARF (acute renal failure) ([place]) 10/04/2020   Physical deconditioning 09/25/2020   History of DVT (deep vein thrombosis) 09/24/2020   DMII (diabetes mellitus, type 2) ([place]) 09/23/2020   Cellulitis of right hand 09/22/2020   Abscess of dorsum of right hand 09/22/2020   Hoarseness 74/25/9563   Metabolic encephalopathy 87/56/4332   Acute encephalopathy    AMS (altered mental status) 02/23/2020   S/P reverse total shoulder arthroplasty, right    Hypokalemia    DOE (dyspnea on exertion) 02/18/2020   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   S/P reverse total shoulder arthroplasty, left 02/16/2020   Preoperative clearance 01/26/2020   Closed displaced fracture of metatarsal bone of right foot 01/20/2019   History of pulmonary embolus (PE) 12/19/2018   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   Rheumatoid arthritis ([place])    Bilateral impacted cerumen 08/18/2018   Fungal otitis externa 08/18/2018   Cerumen debris on tympanic membrane of right ear 08/18/2018   Asthmatic bronchitis with exacerbation 07/29/2018   Pulmonary embolism ([place]) 07/29/2018   Cellulitis of forearm    Fever of unknown origin (FUO) 02/10/2018   Abdominal pain 02/10/2018   Fracture of base of fifth metatarsal bone with routine healing, left    Closed fracture of fifth metatarsal bone of right foot, initial  encounter 01/15/2018   DVT (deep venous thrombosis) ([place]) 01/14/2018   Hypothyroidism 01/14/2018   Depression 01/14/2018   Debility    Malnutrition of moderate degree 12/17/2017   Knee pain    History of breast cancer    Chronic pain syndrome    Fibromyalgia    Graves disease    Vasculitis ([place]) 12/13/2017   Rhabdomyolysis 12/13/2017   Dysphonia 08/10/2017   Pulmonary nodules    Chronic kidney disease, stage 3b ([place]) 04/15/2016   Hx of adenomatous colonic polyps 04/12/2016   Cough 02/09/2016   Opacity of lung on imaging study 11/03/2015   Severe persistent asthma 02/08/2015   Facial pain syndrome 02/08/2015   Insomnia 02/08/2015   Other allergic rhinitis 02/08/2015   LPRD (laryngopharyngeal reflux disease) 02/08/2015   Chronic combined systolic and diastolic CHF (congestive heart failure) ([place]) 02/08/2015   Benign paroxysmal positional vertigo 12/03/2012   Dizziness and giddiness 10/29/2012   Disequilibrium 10/29/2012   Pseudogout 02/24/2012   Pneumonia 05/31/2011   Irritable bowel syndrome 01/02/2010   Hyperlipidemia 12/23/2009   Hyperthyroidism 11/12/2006   Seasonal and perennial allergic rhinitis 11/12/2006   GERD 11/12/2006   NECK PAIN, CHRONIC 11/12/2006   OSTEOPOROSIS 11/12/2006   BREAST CANCER, HX OF 11/12/2006    Past Medical History:  Diagnosis Date   Abscess of  dorsum of right hand 09/22/2020   Acute deep vein thrombosis (DVT) of right lower extremity ([place]) 12/13/2017   Allergic rhinitis    Anemia    Angio-edema    Anxiety    pt denies dx   Arthritis    Phreesia 10/21/2019   Asthma    Phreesia 10/21/2019   Breast cancer ([place]) 1998   Left breast, in remission   Cataract    REMOVED   CHF (congestive heart failure) ([place])    Clostridium difficile colitis 97/67/3419   Complication of anesthesia    "had hard time waking up from it several times" (02/20/2012), no anesthesia problems since 2014   Coronary artery disease    Depression    "some; don't take  anything for it" (02/20/2012), pt denies dx as of 06/02/21   Diverticulosis    DVT (deep venous thrombosis) ([place])    Dyspnea    all the time   Fibromyalgia 11/2011   GERD (gastroesophageal reflux disease)    Graves disease    Headache(784.0)    "related to allergies; more at different times during the year" (02/20/2012)   Hemorrhoids    Hiatal hernia    back and neck   History of blood transfusion 2023   1 unit, 5 units of Iron   Hx of adenomatous colonic polyps 04/12/2016   Hypercholesteremia    good cholesterol is high   Hypothyroidism    IBS (irritable bowel syndrome)    Moderate persistent asthma    -FeV1 72% 2011, -IgE 102 2011, CT sinus Neg 2011   Osteomyelitis of second toe of right foot ([place]) 09/23/2020   Osteoporosis    on reclast yearly   Peripheral vascular disease ([place]) 2019   DVTs (3) lungs and leg   Pneumonia 04/2011; ~ 11/2011   "double; single" (02/20/2012)   Septic olecranon bursitis of right elbow 09/22/2020   Seronegative rheumatoid arthritis ([place])    Dr. Lahoma Rocker   SIRS (systemic inflammatory response syndrome) ([place]) 02/10/2018   Sleep apnea    uses cpap nightly   Tracheobronchomalacia     Family History  Problem Relation Age of Onset   Allergies Mother    Heart disease Mother    Arthritis Mother    Lung cancer Mother        heavy smoker   Diabetes Mother    Allergies Father    Heart disease Father    Arthritis Father    Stroke Father    Heart disease Brother    Diabetes Maternal Grandfather    Colitis Daughter    Colon cancer Other        Maternal half uncle   Past Surgical History:  Procedure Laterality Date   ABDOMINAL HYSTERECTOMY N/A    Phreesia 10/21/2019   ANTERIOR AND POSTERIOR REPAIR  1990's   APPENDECTOMY     ARGON LASER APPLICATION  37/90/2409   Procedure: ARGON LASER APPLICATION;  Surgeon: Yetta Flock, MD;  Location: [facility];  Service: Gastroenterology;;   BREAST LUMPECTOMY  1998   left   BREAST SURGERY N/A     Phreesia 10/21/2019   BRONCHIAL WASHINGS  04/05/2020   Procedure: BRONCHIAL WASHINGS;  Surgeon: Freddi Starr, MD;  Location: [facility];  Service: Pulmonary;;   CAPSULOTOMY Right 08/28/2021   Procedure: CAPSULOTOMY;  Surgeon: Felipa Furnace, DPM;  Location: [facility];  Service: Podiatry;  Laterality: Right;   CARPOMETACARPEL ([place]) FUSION OF THUMB WITH AUTOGRAFT FROM RADIUS  ~ 2009   "both  thumbs" (02/20/2012)   CATARACT EXTRACTION W/ INTRAOCULAR LENS  IMPLANT, BILATERAL  2012   CERVICAL DISCECTOMY  10/2001   C5-C6   CERVICAL FUSION  2003   C3-C4   CHOLECYSTECTOMY     COLONOSCOPY     CORONARY STENT INTERVENTION N/A 01/11/2021   Procedure: CORONARY STENT INTERVENTION;  Surgeon: Leonie Man, MD;  Location: [facility];  Service: Cardiovascular;  Laterality: N/A;   DEBRIDEMENT TENNIS ELBOW  ?1970's   right   ESOPHAGOGASTRODUODENOSCOPY     ESOPHAGOGASTRODUODENOSCOPY (EGD) WITH PROPOFOL N/A 02/05/2021   Procedure: ESOPHAGOGASTRODUODENOSCOPY (EGD) WITH PROPOFOL;  Surgeon: Yetta Flock, MD;  [facility];  Service: Gastroenterology;  Laterality: N/A;   EYE SURGERY N/A    Phreesia 10/21/2019   HAMMER TOE SURGERY Right 08/28/2021   Procedure: HAMMER TOE REPAIR 2ND TOE RIGHT FOOT;  Surgeon: Felipa Furnace, DPM;  Location: [facility];  Service: Podiatry;  Laterality: Right;   HYSTERECTOMY     I & D EXTREMITY Right 09/22/2020   Procedure: IRRIGATION AND DEBRIDEMENT RIGHT HAND AND ELBOW;  Surgeon: Marchia Bond, MD;  [facility];  Service: Orthopedics;  Laterality: Right;   KNEE ARTHROPLASTY  ?1990's   "?right; w/cartilage repair" (02/20/2012)   MASS EXCISION Left 08/28/2021   Procedure: EXCISION OF SOFT TISSUE  MASS LEFT FOOT;  Surgeon: Felipa Furnace, DPM;  Location: [facility];  Service: Podiatry;  Laterality: Left;   NASAL SEPTUM SURGERY  1980's   POSTERIOR CERVICAL FUSION/FORAMINOTOMY  2004   "failed initial fusion; rewired  anterior neck" (02/20/2012)   REVERSE  SHOULDER ARTHROPLASTY Right 02/16/2020   Procedure: REVERSE SHOULDER ARTHROPLASTY;  Surgeon: Marchia Bond, MD;  [facility];  Service: Orthopedics;  Laterality: Right;   RIGHT/LEFT HEART CATH AND CORONARY ANGIOGRAPHY N/A 01/11/2021   Procedure: RIGHT/LEFT HEART CATH AND CORONARY ANGIOGRAPHY;  Surgeon: Leonie Man, MD;  Location: [facility];  Service: Cardiovascular;  Laterality: N/A;   SPINE SURGERY N/A    Phreesia 10/21/2019   TONSILLECTOMY  ~ 1953   VESICOVAGINAL FISTULA CLOSURE W/ TAH  1988   VIDEO BRONCHOSCOPY Bilateral 08/23/2016   Procedure: VIDEO BRONCHOSCOPY WITH FLUORO;  Surgeon: Javier Glazier, MD;  [facility];  Service: Cardiopulmonary;  Laterality: Bilateral;   VIDEO BRONCHOSCOPY N/A 04/05/2020   Procedure: VIDEO BRONCHOSCOPY WITHOUT FLUORO;  Surgeon: Freddi Starr, MD;  [facility];  Service: Pulmonary;  Laterality: N/A;   Social History   Social History Narrative   Patient lives at home alone. Patient  divorced.    Patient has her BS degree.   Right handed.   Caffeine- sometimes coffee.      [facility]:   Born in [place], [place]. She worked as a Copywriter, advertising. She has no pets currently. She does have indoor plants. Previously had mold in her home that was remediated. Carpet was removed.          Immunization History  Administered Date(s) Administered   DTaP 08/18/2013   Fluad Quad(high Dose 65+) 10/16/2018, 11/20/2019, 11/03/2020   Hepatitis A 09/04/2007, 03/02/2008   Hepatitis B 01/07/1985, 02/06/1985, 08/17/1985   Influenza Split 11/13/2010, 11/22/2011, 10/20/2012   Influenza Whole 11/14/2009, 11/21/2011   Influenza, High Dose Seasonal PF 11/09/2015, 11/27/2017   Influenza, Quadrivalent, Recombinant, Inj, Pf 11/20/2019   Influenza,inj,Quad PF,6+ Mos 11/06/2013, 11/09/2014, 11/06/2016   Meningococcal Conjugate 09/04/2007   PFIZER(Purple Top)SARS-COV-2 Vaccination 03/12/2019, 04/01/2019, 12/16/2019,  06/29/2020   Pfizer Covid-19 Vaccine Bivalent Booster 26yr & up 03/02/2021  Pneumococcal Conjugate-13 05/18/2013   Pneumococcal Polysaccharide-23 01/05/1994, 11/28/2011, 05/27/2017   Td 07/24/1995, 03/16/2005   Tdap 08/18/2013   Zoster Recombinat (Shingrix) 09/18/2017   Zoster, Live 03/02/2008     Objective: Vital Signs: BP (!) 143/69 (BP Location: Left Arm, Patient Position: Sitting, Cuff Size: Small)   Pulse 66   Resp 12   Ht '5\' 1"'$  (1.549 m)   Wt 136 lb 3.2 oz (61.8 kg)   BMI 25.73 kg/m    Physical Exam Cardiovascular:     Rate and Rhythm: Normal rate and regular rhythm.  Pulmonary:     Effort: Pulmonary effort is normal.     Breath sounds: Normal breath sounds.  Musculoskeletal:     Right lower leg: No edema.     Left lower leg: No edema.  Skin:    General: Skin is warm and dry.  Neurological:     Mental Status: She is alert.  Psychiatric:        Mood and Affect: Mood normal.     Musculoskeletal Exam:  Neck full ROM no tenderness Shoulders full ROM no tenderness or swelling Elbows full ROM no tenderness or swelling Wrists slightly decreased flexion and extension motion no palpable swelling, chornic hypertrophy and joint thickening over MCP joints of bilateral hands, heberdon's nodes, tenderness on right hand Left knee effusion present, mildly tender to pressure, ROM is normal Ankles full ROM no tenderness or swelling   Investigation: No additional findings.  Imaging: DG Foot Complete Left  Result Date: 09/14/2021 Please see detailed radiograph report in office note.  DG Foot Complete Right  Result Date: 09/14/2021 Please see detailed radiograph report in office note.  DG Foot Complete Right  Result Date: 08/28/2021 CLINICAL DATA:  Status post internal fixation right second toe EXAM: RIGHT FOOT COMPLETE - 3+ VIEW COMPARISON:  09/28/2020 FINDINGS: There is interval internal fixation of right second toe and head of the second metatarsal with a surgical  pin. Old ununited fractures are seen in the bases of right fourth and fifth metatarsals. Degenerative changes are noted in first tarsometatarsal joint with joint-space narrowing, bony spurs and subcortical cysts. There is linear calcification in Achilles tendon close to the calcaneus suggesting calcific tendinosis. There is soft tissue swelling over the dorsum of the forefoot. IMPRESSION: Enteral internal fixation of right second toe and head of the second metatarsal. Old ununited fractures are seen in basis of right fourth and fifth metatarsals. Other findings as described in the body of the report. Electronically Signed   By: Elmer Picker M.D.   On: 08/28/2021 12:17   DG MINI C-ARM IMAGE ONLY  Result Date: 08/28/2021 There is no interpretation for this exam.  This order is for images obtained during a surgical procedure.  Please See "Surgeries" Tab for more information regarding the procedure.    Recent Labs: Lab Results  Component Value Date   WBC 11.0 (H) 09/19/2021   HGB 9.1 (L) 09/19/2021   PLT 219 09/19/2021   NA 140 09/19/2021   K 4.4 09/19/2021   CL 106 09/19/2021   CO2 26 09/19/2021   GLUCOSE 177 (H) 09/19/2021   BUN 35 (H) 09/19/2021   CREATININE 1.72 (H) 09/19/2021   BILITOT 0.5 09/19/2021   ALKPHOS 49 09/19/2021   AST 40 09/19/2021   ALT 38 09/19/2021   PROT 7.2 09/19/2021   ALBUMIN 3.8 09/19/2021   CALCIUM 8.8 (L) 09/19/2021   GFRAA 40 (L) 08/25/2020    Speciality Comments: No specialty comments available.  Procedures:  Allergies: Baclofen, Dust mite extract, Molds & smuts, Morphine and related, Other, Penicillins, Rofecoxib, Shellfish allergy, Tetracycline hcl, Xolair [omalizumab], Zoledronic acid, Dilaudid [hydromorphone hcl], Levofloxacin, Oxycodone hcl, Paroxetine, Celecoxib, Diltiazem hcl, Hydrocodone-acetaminophen, Lactose intolerance (gi), Rituximab, Tree extract, Gamunex [immune globulin], and Penicillin g procaine   Assessment / Plan:     Visit  Diagnoses: Rheumatoid arthritis with negative rheumatoid factor, involving unspecified site [facility]) - Plan: Synovial Fluid Analysis, Complete  Joint pain active in several areas but main complaint today is left knee.  There is some joint effusion present.  Her typical RA inflammation and hands and other joints does not appear to be an exacerbation on the prednisone 7 mg daily.  For aspiration of left knee for synovial fluid analysis trying to differentiate amount of inflammatory arthritis versus OA with effusion.  If fluid is not very inflammatory would recommend maintaining on the same current systemic immunosuppression.  Pseudogout  Also with history of calcium pyrophosphate crystals on previous fluid analysis checking for evidence of this again today.  Effusion, left knee - Plan: Synovial Fluid Analysis, Complete   Orders: Orders Placed This Encounter  Procedures   Synovial Fluid Analysis, Complete   No orders of the defined types were placed in this encounter.    Follow-Up Instructions: Return in about 3 months (around 11/09/2021) for RA on GC f/u 81mo.   CCollier Salina MD  Note - This record has been created using [employer]  Chart creation errors have been sought, but may not always  have been located. Such creation errors do not reflect on  the standard of medical care.

## 2021-08-09 NOTE — Telephone Encounter (Signed)
Patient called to give Ebony Scott the following information: Hizentra 13 mg 1/time 50 ml, 3 times/5 ml = 65 ml total  Patient's pharmacy is [facility]

## 2021-08-10 NOTE — Telephone Encounter (Signed)
Patient's chart noted.  

## 2021-08-14 ENCOUNTER — Telehealth: Payer: Self-pay | Admitting: Cardiology

## 2021-08-14 ENCOUNTER — Ambulatory Visit: Payer: Medicare PPO | Admitting: Family Medicine

## 2021-08-14 ENCOUNTER — Telehealth: Payer: Self-pay | Admitting: Pulmonary Disease

## 2021-08-14 VITALS — BP 150/78 | HR 72 | Temp 97.8°F | Ht 61.0 in | Wt 136.0 lb

## 2021-08-14 DIAGNOSIS — E1169 Type 2 diabetes mellitus with other specified complication: Secondary | ICD-10-CM | POA: Diagnosis not present

## 2021-08-14 DIAGNOSIS — I5020 Unspecified systolic (congestive) heart failure: Secondary | ICD-10-CM | POA: Diagnosis not present

## 2021-08-14 DIAGNOSIS — Z7901 Long term (current) use of anticoagulants: Secondary | ICD-10-CM | POA: Diagnosis not present

## 2021-08-14 DIAGNOSIS — N1832 Chronic kidney disease, stage 3b: Secondary | ICD-10-CM

## 2021-08-14 MED ORDER — VALSARTAN 80 MG PO TABS: 80.0000 mg | ORAL_TABLET | Freq: Every day | ORAL | 3 refills | Status: DC

## 2021-08-14 NOTE — Telephone Encounter (Signed)
Called patient but she did not answer. Left message for her to call back. Her last OV note mentioned Stiolto instead of Spiriva.

## 2021-08-15 ENCOUNTER — Encounter: Payer: Self-pay | Admitting: Internal Medicine

## 2021-08-15 LAB — CBC WITH DIFFERENTIAL/PLATELET
Absolute Monocytes: 777 cells/uL (ref 200–950)
Basophils Absolute: 44 cells/uL (ref 0–200)
Basophils Relative: 0.4 %
Eosinophils Absolute: 155 cells/uL (ref 15–500)
Eosinophils Relative: 1.4 %
HCT: 31.8 % — ABNORMAL LOW (ref 35.0–45.0)
Hemoglobin: 10.7 g/dL — ABNORMAL LOW (ref 11.7–15.5)
Lymphs Abs: 544 cells/uL — ABNORMAL LOW (ref 850–3900)
MCH: 36.6 pg — ABNORMAL HIGH (ref 27.0–33.0)
MCHC: 33.6 g/dL (ref 32.0–36.0)
MCV: 108.9 fL — ABNORMAL HIGH (ref 80.0–100.0)
MPV: 11.5 fL (ref 7.5–12.5)
Monocytes Relative: 7 %
Neutro Abs: 9579 cells/uL — ABNORMAL HIGH (ref 1500–7800)
Neutrophils Relative %: 86.3 %
Platelets: 164 10*3/uL (ref 140–400)
RBC: 2.92 10*6/uL — ABNORMAL LOW (ref 3.80–5.10)
RDW: 13.8 % (ref 11.0–15.0)
Total Lymphocyte: 4.9 %
WBC: 11.1 10*3/uL — ABNORMAL HIGH (ref 3.8–10.8)

## 2021-08-15 LAB — COMPLETE METABOLIC PANEL WITH GFR
AG Ratio: 1.3 (calc) (ref 1.0–2.5)
ALT: 73 U/L — ABNORMAL HIGH (ref 6–29)
AST: 59 U/L — ABNORMAL HIGH (ref 10–35)
Albumin: 3.5 g/dL — ABNORMAL LOW (ref 3.6–5.1)
Alkaline phosphatase (APISO): 68 U/L (ref 37–153)
BUN/Creatinine Ratio: 28 (calc) — ABNORMAL HIGH (ref 6–22)
BUN: 38 mg/dL — ABNORMAL HIGH (ref 7–25)
CO2: 27 mmol/L (ref 20–32)
Calcium: 8.9 mg/dL (ref 8.6–10.4)
Chloride: 105 mmol/L (ref 98–110)
Creat: 1.37 mg/dL — ABNORMAL HIGH (ref 0.60–1.00)
Globulin: 2.8 g/dL (calc) (ref 1.9–3.7)
Glucose, Bld: 218 mg/dL — ABNORMAL HIGH (ref 65–99)
Potassium: 4.4 mmol/L (ref 3.5–5.3)
Sodium: 141 mmol/L (ref 135–146)
Total Bilirubin: 0.6 mg/dL (ref 0.2–1.2)
Total Protein: 6.3 g/dL (ref 6.1–8.1)
eGFR: 41 mL/min/{1.73_m2} — ABNORMAL LOW (ref >=60)

## 2021-08-15 LAB — HEMOGLOBIN A1C
Hgb A1c MFr Bld: 6.3 % of total Hgb — ABNORMAL HIGH (ref <5.7)
Mean Plasma Glucose: 134 mg/dL
eAG (mmol/L): 7.4 mmol/L

## 2021-08-15 MED ORDER — STIOLTO RESPIMAT 2.5-2.5 MCG/ACT IN AERS: 2.0000 | INHALATION_SPRAY | Freq: Every day | RESPIRATORY_TRACT | 11 refills | Status: DC

## 2021-08-15 NOTE — Telephone Encounter (Signed)
Patient returned call, verified with patient that she is needing refills of Stiolto.  Verified pharmacy and script sent.  Patient aware and she verbalized understanding.  Nothing further needed.

## 2021-08-16 ENCOUNTER — Other Ambulatory Visit: Payer: Self-pay | Admitting: Physician Assistant

## 2021-08-23 ENCOUNTER — Telehealth: Payer: Self-pay | Admitting: Cardiology

## 2021-08-23 ENCOUNTER — Telehealth: Payer: Self-pay | Admitting: Internal Medicine

## 2021-08-23 NOTE — Telephone Encounter (Signed)
   Name: Ebony Scott  DOB: 1950/02/24  MRN: [id number]   Primary Cardiologist: Buford Dresser, MD  Chart reviewed as part of pre-operative protocol coverage. Patient was contacted 08/23/2021 in reference to pre-operative risk assessment for pending surgery as outlined below.  Ebony Scott was last seen on 06/08/2021 by Dr. Harrell Gave.  Since that day, Ebony Scott has done well from a cardiac standpoint.  Therefore, based on ACC/AHA guidelines, the patient would be at acceptable risk for the planned procedure without further cardiovascular testing.   The patient was advised that if she develops new symptoms prior to surgery to contact our office to arrange for a follow-up visit, and she verbalized understanding.  Of note, patient was previously cleared for surgery by Dr. Harrell Gave. Per Dr. Judeth Cornfield note on 06/08/2021 "given that she is less than 1 year from her stent, would either perform surgery on clopidogrel or use aspirin while clopidogrel on hold for surgery. Needs to be on an antiplatelet perioperatively. Once she is able to restart clopidogrel post operatively, she can stop the aspirin (does not need to be on dual antiplatelet therapy, single agent fine)."  Advised patient that she may stop Plavix for 5 days prior to surgery, however, she will need to take aspirin 81 mg daily while off Plavix. Please resume Plavix as soon as possible postprocedure, at the discretion of the surgeon. Once Plavix is resumed, she may discontinue aspirin. Pt verbalized understanding.   I will route this recommendation to the requesting party via Epic fax function and remove from pre-op pool. Please call with questions.  Lenna Sciara, NP 08/23/2021, 1:41 PM

## 2021-08-23 NOTE — Telephone Encounter (Signed)
Patient called the office stating her fingers are hurting on both hands. Patient states she has tried bio-freeze, Voltaren gel, and tylenol. Patient states she takes tramadol in the evening but the pain is mainly during the day. Patient requests something be called in for this. Patients pharmacy is [facility].

## 2021-08-23 NOTE — Telephone Encounter (Signed)
   Pre-operative Risk Assessment    Patient Name: Ebony Scott  DOB: [birth year] MRN: [id number]      Request for Surgical Clearance    Procedure:   Hammer Toe repair with capsulotomy for 2nd toe on right foot and Excision of soft tissue mass on the left foot    2. When is this surgery scheduled? Press F2 to enter date below and place date in Reason for Visit (see directions below). :1} Date of Surgery:  Clearance 08/28/21                                 Surgeon:  Dr. Boneta Lucks Surgeon's Group or Practice Name:  [facility] Phone number:  [phone] Fax number:  [phone]   Type of Clearance Requested:   - Medical  - Pharmacy:  Hold Clopidogrel (Plavix) , they ask that you let them know what else they should hold   Type of Anesthesia:  not sure yet, anesthesiologist will determine.     Additional requests/questions:    Dorthey Sawyer   08/23/2021, 10:55 AM

## 2021-08-25 ENCOUNTER — Encounter (HOSPITAL_COMMUNITY): Payer: Self-pay | Admitting: Podiatry

## 2021-08-25 NOTE — Progress Notes (Addendum)
Anesthesia Chart Review: SAME DAY WORK-UP  Case: [id number] Date/Time: 08/28/21 1015   Procedures:      HAMMER TOE REPAIR 2ND TOE RIGHT FOOT (Right: Toe)     CAPSULOTOMY (Right)     EXCISION OF SOFT TISSUE  MASS LEFT FOOT (Left)   Anesthesia type: Monitor Anesthesia Care   Pre-op diagnosis: HAMMER TOE, SOFT TISSUE MASS,   Location: [facility] OR ROOM 06 / [facility]   Surgeons: Felipa Furnace, DPM       DISCUSSION: Patient is a 71 year old female scheduled for the above procedure.  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 Reported "hard time waking up" after anesthesia several times.   She is on Xarelto for recurrent DVT/PE. On 08/14/21, PCP Dr. Dennard Schaumann advised she may hold Xarelto for 48 hours prior to surgery, but advised anti-platelet instructions be addressed by cardiology given DES in 12/2020.    Preoperative cardiology input outline by Diona Browner, NP on 08/23/21: "CHAKIA COUNTS was last seen on 06/08/2021 by Dr. Harrell Gave.  Since that day, OLIVIANNA HIGLEY has done well from a cardiac standpoint.   Therefore, based on ACC/AHA guidelines, the patient would be at acceptable risk for the planned procedure without further cardiovascular testing... Of note, patient was previously cleared for surgery by Dr. Harrell Gave. Per Dr. Judeth Cornfield note on 06/08/2021 'given that she is less than 1 year from her stent, would  either perform surgery on clopidogrel or use aspirin while clopidogrel on hold for surgery. Needs to be on an antiplatelet perioperatively. Once she is able to restart clopidogrel post operatively, she can stop the aspirin (does not need to be on dual antiplatelet therapy, single agent fine).'   Advised patient that she may stop Plavix for 5 days prior to surgery, however, she will need to take aspirin 81 mg daily while off Plavix. Please resume Plavix as soon as possible postprocedure, at the discretion of the surgeon. Once Plavix is resumed, she may discontinue aspirin. Pt verbalized understanding."  Last pulmonology visit with Dr. Erin Fulling was on 03/15/21. She had remained stable since visit in 10/2020. He notes, "She continues to remain symptomatic with cough, sputum production and wheezing.  This is due in combination to her swallowing issues with incomplete epiglottic inversion leading to laryngeal penetration of liquid to the vocal cords.  She also has bronchiectasis and tracheobronchomalacia." Continued use of Stiolto and flutter valve recommended. She had lung nodules and opacities on 03/2020 CT scan that showed improvement on 08/2020 imaging, so "likely representing an infectious or inflammatory etiology. No further follow up of the pulmonary nodules at this time."  04/05/20 bronchoscopy was negative for malignancy.  - For shoulder surgery in 2021, pulmonologist recommended to have nasal intermittent positive pressure ventilation (NIPPV) available if needed.   She is a same-day work-up, so anesthesia team to evaluate on the day of surgery. She is on chronic prednisone for RA and receives immunoglobulin infusion for hypogammaglobulinemia.     VS:  BP Readings from Last 3 Encounters:  08/14/21 (!) 150/78  08/09/21 Marland Kitchen)  143/69  06/14/21 110/60   Pulse Readings from Last 3 Encounters:  08/14/21 72  08/09/21 66  06/14/21 73     PROVIDERS: [name], MD is PCP Buford Dresser, MD  is Cardiologist  Freda Jackson, MD is pulmonlogist Renata Caprice, MD is GI Jerrell Belfast, MD is ENT Vernelle Emerald, MD is rheumatologist Allena Katz, MD is allergist/immunologist Dede Query, PA-C is hematology provider   LABS: Most recent lab results include: Lab Results  Component Value Date   WBC 11.1 (H) 08/14/2021   HGB 10.7 (L) 08/14/2021   HCT 31.8 (L) 08/14/2021   PLT 164 08/14/2021   GLUCOSE 218 (H) 08/14/2021   ALT 73 (H) 08/14/2021   AST 59 (H) 08/14/2021   NA 141 08/14/2021   K 4.4 08/14/2021   CL 105 08/14/2021   CREATININE 1.37 (H) 08/14/2021   BUN 38 (H) 08/14/2021   CO2 27 08/14/2021   TSH 1.161 05/24/2021   HGBA1C 6.3 (H) 08/14/2021  AST 59, ALT 73 which is consistent with labs from 05/24/21 showing AST 70 and ALT 87.  No acute findings on abdominal US in March 2023.    OTHER: Spirometry 02/28/21: FVC 1.47 (61%). FEV1 1.22 (68%), FEV1R 0.83 (111%), FEF25-75 1.42 (94%). Moderate restriction.  Split Night CPAP 04/20/19: IMPRESSIONS No Significant Obstructive Sleep apnea(OSA) EKG showed no cardiac abnormalities. Moderate Oxygen Desaturation The patient snored with moderate snoring volume. No significant periodic leg movements(PLMs) during sleep. However, no significant associated arousals.  HST 01/06/19: A total of 39 apnea events occurred for an apnea index of 4.8/h.Marland KitchenMarland KitchenAHI of 4.9/hour of sleep... The lowest SaO2 was 81% with an average of 87%. IMPRESSION: Negative study for significant sleep disordered breathing.  Moderate oxygen desaturations.   IMAGES: [place] Abd 05/04/21: IMPRESSION: 1. No acute findings post cholecystectomy.     EKG: 06/08/21 ([facility]): Normal sinus rhythm Moderate voltage criteria for LVH, may be normal variant Anteroseptal infarct, age undetermined T wave abnormality, consider lateral ischemi   CV: Echo 05/25/21 IMPRESSIONS   1. Septal and apical akinesis distal anterior wall and distal inferior  wall  hypokinesis EF similar to TTE done 09/16/20. Left ventricular ejection  fraction, by estimation, is 30 to 35%. The left ventricle has moderately  decreased function. The left  ventricle has no regional wall motion abnormalities. The left ventricular  internal cavity size was moderately dilated. Left ventricular diastolic  parameters are consistent with Grade I diastolic dysfunction (impaired  relaxation). Elevated left  ventricular end-diastolic pressure.   2. Right ventricular systolic function is normal. The right ventricular  size is normal.   3. The mitral valve is normal in structure. Mild mitral valve  regurgitation. No evidence of mitral stenosis.   4. Calcified non coronary cusp . The aortic valve is tricuspid. There is  mild calcification of the aortic valve. Aortic valve regurgitation is not  visualized. Aortic valve sclerosis is present, with no evidence of aortic  valve stenosis.   5. The inferior vena cava is normal in size with greater than 50%  respiratory variability, suggesting right atrial pressure of 3 mmHg.  - Comparison 09/16/20: LVEF 35-40%, septal apical and anterior wall hypokinesis, grade II DD, elevated LVEDP, normal RVSF, mild MR, mild-moderate AV sclerosis without AS; 07/10/19: LVEF > 75%, no RWMA, grade 1 DD   RHC/LHC/PCI 01/11/21: SUMMARY Severe single-vessel CAD with 95% and 90% stenoses of the proximal and mid LAD at major 1st Diag branch.  Likely be culprit for reduced EF and anterior hypokinesis on  echo. Successful DES PCI of the LAD using an Onyx Frontier DES 2.25 mm x 34 mm postdilated in tapered fashion from 2.6 to 2.32m.   TIMI III flow pre and post, reduced to 0% Otherwise minimal CAD in the Right dominant system. 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 Moderate to severely reduced cardiac function with Cardiac Output-Index of 4.05-2.59 by Fick and 3.75-2.40 by Thermal Dilution.   Past Medical History:  Diagnosis Date   Abscess of dorsum of right hand 09/22/2020   Acute deep vein thrombosis (DVT) of right lower extremity ([facility] 12/13/2017   Allergic rhinitis    Anemia    Angio-edema    Anxiety    pt denies dx   Arthritis    Phreesia 10/21/2019   Asthma    Phreesia 10/21/2019   Breast cancer ([facility]   Left breast, in remission   Cataract    REMOVED   CHF (congestive heart failure) ([facility]    Clostridium difficile colitis 116/11/9602  Complication of anesthesia    "had hard time waking up from it several times" (02/20/2012)   Depression    "some; don't take anything for it" (02/20/2012), pt denies dx as of 06/02/21   Diverticulosis    DVT (deep venous thrombosis) ([place]    Dyspnea    all the time   Fibromyalgia 11/2011   GERD (gastroesophageal reflux disease)    Graves disease    Headache(784.0)    "related to allergies; more at different times during the year" (02/20/2012)   Hemorrhoids    Hiatal hernia    back and neck   History of blood transfusion 2023   1 unit, 5 units of Iron   Hx of adenomatous colonic polyps 04/12/2016   Hypercholesteremia    good cholesterol is high   Hypothyroidism    IBS (irritable bowel syndrome)    Moderate persistent asthma    -FeV1 72% 2011, -IgE 102 2011, CT sinus Neg 2011   Osteomyelitis of second toe of right foot ([place] 09/23/2020   Osteoporosis    on reclast yearly   Peripheral vascular disease ([facility] 2019   DVTs (3) lungs and leg   Pneumonia 04/2011; ~ 11/2011   "double; single" (02/20/2012)   Septic olecranon bursitis of right elbow 09/22/2020   Seronegative rheumatoid arthritis ([facility]    Dr. GLahoma Rocker  SIRS (systemic inflammatory response syndrome) ([facility] 02/10/2018   Sleep apnea    uses cpap nightly   Tracheobronchomalacia     Past  Surgical History:  Procedure Laterality Date   ABDOMINAL HYSTERECTOMY N/A    Phreesia 10/21/2019   ANTERIOR AND POSTERIOR REPAIR  1990's   APPENDECTOMY     ARGON LASER APPLICATION  154/10/8117  Procedure: ARGON LASER APPLICATION;  Surgeon: AYetta Flock MD;  Location: [facility];  Service: Gastroenterology;;   BREAST LUMPECTOMY  1998   left   BREAST SURGERY N/A    Phreesia 10/21/2019   BRONCHIAL WASHINGS  04/05/2020   Procedure: BRONCHIAL WASHINGS;  Surgeon: DFreddi Starr MD;  Location: [facility];  Service: Pulmonary;;   CARPOMETACARPEL ([place] FUSION OF THUMB WITH AUTOGRAFT FROM RADIUS  ~ 2009   "both thumbs" (02/20/2012)   CATARACT EXTRACTION W/ INTRAOCULAR LENS  IMPLANT, BILATERAL  2012   CERVICAL DISCECTOMY  10/2001   C5-C6   CERVICAL FUSION  2003   C3-C4   CHOLECYSTECTOMY     COLONOSCOPY     CORONARY STENT INTERVENTION N/A 01/11/2021   Procedure: CORONARY STENT INTERVENTION;  Surgeon: Leonie Man, MD;  Location: [facility];  Service: Cardiovascular;  Laterality: N/A;   DEBRIDEMENT TENNIS ELBOW  ?1970's   right   ESOPHAGOGASTRODUODENOSCOPY     ESOPHAGOGASTRODUODENOSCOPY (EGD) WITH PROPOFOL N/A 02/05/2021   Procedure: ESOPHAGOGASTRODUODENOSCOPY (EGD) WITH PROPOFOL;  Surgeon: Yetta Flock, MD;  [facility]: [facility];  Service: Gastroenterology;  Laterality: N/A;   EYE SURGERY N/A    Phreesia 10/21/2019   HYSTERECTOMY     I & D EXTREMITY Right 09/22/2020   Procedure: IRRIGATION AND DEBRIDEMENT RIGHT HAND AND ELBOW;  Surgeon: Marchia Bond, MD;  Location: [facility];  Service: Orthopedics;  Laterality: Right;   KNEE ARTHROPLASTY  ?1990's   "?right; w/cartilage repair" (02/20/2012)   NASAL SEPTUM SURGERY  1980's   POSTERIOR CERVICAL FUSION/FORAMINOTOMY  2004   "failed initial fusion; rewired  anterior neck" (02/20/2012)   REVERSE SHOULDER ARTHROPLASTY Right 02/16/2020   Procedure: REVERSE SHOULDER ARTHROPLASTY;  Surgeon: Marchia Bond, MD;   [facility]: [facility];  Service: Orthopedics;  Laterality: Right;   RIGHT/LEFT HEART CATH AND CORONARY ANGIOGRAPHY N/A 01/11/2021   Procedure: RIGHT/LEFT HEART CATH AND CORONARY ANGIOGRAPHY;  Surgeon: Leonie Man, MD;  Location: [facility];  Service: Cardiovascular;  Laterality: N/A;   SPINE SURGERY N/A    Phreesia 10/21/2019   TONSILLECTOMY  ~ 1953   VESICOVAGINAL FISTULA CLOSURE W/ TAH  1988   VIDEO BRONCHOSCOPY Bilateral 08/23/2016   Procedure: VIDEO BRONCHOSCOPY WITH FLUORO;  Surgeon: Javier Glazier, MD;  [facility];  Service: Cardiopulmonary;  Laterality: Bilateral;   VIDEO BRONCHOSCOPY N/A 04/05/2020   Procedure: VIDEO BRONCHOSCOPY WITHOUT FLUORO;  Surgeon: Freddi Starr, MD;  [facility];  Service: Pulmonary;  Laterality: N/A;    MEDICATIONS: No current facility-administered medications for this encounter.    acetaminophen (TYLENOL) 650 MG CR tablet   albuterol (VENTOLIN HFA) 108 (90 Base) MCG/ACT inhaler   allopurinol (ZYLOPRIM) 100 MG tablet   Alpha-D-Galactosidase (BEANO PO)   Alpha-Lipoic Acid 600 MG TABS   atorvastatin (LIPITOR) 80 MG tablet   azelastine (ASTELIN) 0.1 % nasal spray   benzonatate (TESSALON) 200 MG capsule   Calcium-Magnesium-Vitamin D (CALCIUM MAGNESIUM PO)   cetirizine (ZYRTEC) 10 MG tablet   cholecalciferol (VITAMIN D) 25 MCG (1000 UNIT) tablet   clopidogrel (PLAVIX) 75 MG tablet   cyproheptadine (PERIACTIN) 4 MG tablet   denosumab (PROLIA) 60 MG/ML SOSY injection   dexlansoprazole (DEXILANT) 60 MG capsule   diclofenac Sodium (VOLTAREN) 1 % GEL   empagliflozin (JARDIANCE) 10 MG TABS tablet   EPINEPHrine 0.3 mg/0.3 mL IJ SOAJ injection   famotidine (PEPCID) 20 MG tablet   fluticasone (FLONASE) 50 MCG/ACT nasal spray   folic acid (FOLVITE) 1 MG tablet   furosemide (LASIX) 40 MG tablet   guaiFENesin (MUCINEX) 600 MG 12 hr tablet   HIZENTRA 1 GM/5ML SOLN   isosorbide-hydrALAZINE (BIDIL) 20-37.5 MG tablet   Lactase  (LACTOSE FAST ACTING RELIEF PO)   lidocaine-prilocaine (EMLA) cream   magnesium oxide (MAG-OX) 400 (240 Mg) MG tablet   Menthol, Topical Analgesic, (BIOFREEZE) 4 % GEL   mirtazapine (REMERON SOL-TAB) 30 MG disintegrating tablet   montelukast (SINGULAIR) 10 MG tablet   Nystatin (GERHARDT'S BUTT CREAM) CREA   nystatin (MYCOSTATIN) 100000 UNIT/ML suspension   nystatin-triamcinolone  ointment (MYCOLOG)   potassium chloride SA (KLOR-CON M) 20 MEQ tablet   predniSONE (DELTASONE) 1 MG tablet   predniSONE (DELTASONE) 5 MG tablet   Prenatal Vit-Fe Fumarate-FA (PRENATAL VITAMINS PO)   Probiotic Product (ALIGN PO)   Propylene Glycol (SYSTANE COMPLETE) 0.6 % SOLN   Tiotropium Bromide-Olodaterol (STIOLTO RESPIMAT) 2.5-2.5 MCG/ACT AERS   traMADol (ULTRAM) 50 MG tablet   valsartan (DIOVAN) 80 MG tablet   XARELTO 20 MG TABS tablet   Glucose Blood (BLOOD GLUCOSE TEST STRIPS) STRP   LORazepam (ATIVAN) 0.5 MG tablet   NEOMYCIN-POLYMYXIN-HYDROCORTISONE (CORTISPORIN) 1 % SOLN OTIC solution    iohexol (OMNIPAQUE) 350 MG/ML injection    Myra Gianotti, PA-C Surgical Short Stay/Anesthesiology [facility] Phone [phone] [facility] Phone [phone] 08/25/2021 1:49 PM

## 2021-08-25 NOTE — H&P (View-Only) (Signed)
Anesthesia Chart Review: Ebony Scott  Case: [id number] Date/Time: 08/28/21 1015   Procedures:      HAMMER TOE REPAIR 2ND TOE RIGHT FOOT (Right: Toe)     CAPSULOTOMY (Right)     EXCISION OF SOFT TISSUE  MASS LEFT FOOT (Left)   Anesthesia type: Monitor Anesthesia Care   Pre-op diagnosis: HAMMER TOE, SOFT TISSUE MASS,   Location: [facility] OR ROOM 06 / [place] [facility]   Surgeons: Felipa Furnace, DPM       DISCUSSION: Patient is a 71 year old female scheduled for the above procedure.  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 Reported "hard time waking up" after anesthesia several times.   She is on Xarelto for recurrent DVT/PE. On 08/14/21, PCP Dr. Dennard Schaumann advised she may hold Xarelto for 48 hours prior to surgery, but advised anti-platelet instructions be addressed by cardiology given DES in 12/2020.    Preoperative cardiology input outline by Diona Browner, NP on 08/23/21: "NAMI STRAWDER was last seen on 06/08/2021 by Dr. Harrell Gave.  Since that day, AMILYAH NACK has done well from a cardiac standpoint.   Therefore, based on ACC/AHA guidelines, the patient would be at acceptable risk for the planned procedure without further cardiovascular testing... Of note, patient was previously cleared for surgery by Dr. Harrell Gave. Per Dr. Judeth Cornfield note on 06/08/2021 'given that she is less than 1 year from her stent, would  either perform surgery on clopidogrel or use aspirin while clopidogrel on hold for surgery. Needs to be on an antiplatelet perioperatively. Once she is able to restart clopidogrel post operatively, she can stop the aspirin (does not need to be on dual antiplatelet therapy, single agent fine).'   Advised patient that she may stop Plavix for 5 days prior to surgery, however, she will need to take aspirin 81 mg daily while off Plavix. Please resume Plavix as soon as possible postprocedure, at the discretion of the surgeon. Once Plavix is resumed, she may discontinue aspirin. Pt verbalized understanding."  Last pulmonology visit with Dr. Erin Fulling was on 03/15/21. She had remained stable since visit in 10/2020. He notes, "She continues to remain symptomatic with cough, sputum production and wheezing.  This is due in combination to her swallowing issues with incomplete epiglottic inversion leading to laryngeal penetration of liquid to the vocal cords.  She also has bronchiectasis and tracheobronchomalacia." Continued use of Stiolto and flutter valve recommended. She had lung nodules and opacities on 03/2020 CT scan that showed improvement on 08/2020 imaging, so "likely representing an infectious or inflammatory etiology. No further follow up of the pulmonary nodules at this time."  04/05/20 bronchoscopy was negative for malignancy.  - For shoulder surgery in 2021, pulmonologist recommended to have nasal intermittent positive pressure ventilation (NIPPV) available if needed.   She is a Ebony-day Scott, so anesthesia team to evaluate on the day of surgery. She is on chronic prednisone for RA and receives immunoglobulin infusion for hypogammaglobulinemia.     VS:  BP Readings from Last 3 Encounters:  08/14/21 (!) 150/78  08/09/21 Marland Kitchen)  143/69  06/14/21 110/60   Pulse Readings from Last 3 Encounters:  08/14/21 72  08/09/21 66  06/14/21 73     PROVIDERS: [name], MD is PCP Buford Dresser, MD  is Cardiologist  Freda Jackson, MD is pulmonlogist Renata Caprice, MD is GI Jerrell Belfast, MD is ENT Vernelle Emerald, MD is rheumatologist Allena Katz, MD is allergist/immunologist Dede Query, PA-C is hematology provider   LABS: Most recent lab results include: Lab Results  Component Value Date   WBC 11.1 (H) 08/14/2021   HGB 10.7 (L) 08/14/2021   HCT 31.8 (L) 08/14/2021   PLT 164 08/14/2021   GLUCOSE 218 (H) 08/14/2021   ALT 73 (H) 08/14/2021   AST 59 (H) 08/14/2021   NA 141 08/14/2021   K 4.4 08/14/2021   CL 105 08/14/2021   CREATININE 1.37 (H) 08/14/2021   BUN 38 (H) 08/14/2021   CO2 27 08/14/2021   TSH 1.161 05/24/2021   HGBA1C 6.3 (H) 08/14/2021  AST 59, ALT 73 which is consistent with labs from 05/24/21 showing AST 70 and ALT 87.  No acute findings on abdominal US in March 2023.    OTHER: Spirometry 02/28/21: FVC 1.47 (61%). FEV1 1.22 (68%), FEV1R 0.83 (111%), FEF25-75 1.42 (94%). Moderate restriction.  Split Night CPAP 04/20/19: IMPRESSIONS No Significant Obstructive Sleep apnea(OSA) EKG showed no cardiac abnormalities. Moderate Oxygen Desaturation The patient snored with moderate snoring volume. No significant periodic leg movements(PLMs) during sleep. However, no significant associated arousals.  HST 01/06/19: A total of 39 apnea events occurred for an apnea index of 4.8/h.Marland KitchenMarland KitchenAHI of 4.9/hour of sleep... The lowest SaO2 was 81% with an average of 87%. IMPRESSION: Negative study for significant sleep disordered breathing.  Moderate oxygen desaturations.   IMAGES: [place] Abd 05/04/21: IMPRESSION: 1. No acute findings post cholecystectomy.     EKG: 06/08/21 ([facility]): Normal sinus rhythm Moderate voltage criteria for LVH, may be normal variant Anteroseptal infarct, age undetermined T wave abnormality, consider lateral ischemi   CV: Echo 05/25/21 IMPRESSIONS   1. Septal and apical akinesis distal anterior wall and distal inferior  wall  hypokinesis EF similar to TTE done 09/16/20. Left ventricular ejection  fraction, by estimation, is 30 to 35%. The left ventricle has moderately  decreased function. The left  ventricle has no regional wall motion abnormalities. The left ventricular  internal cavity size was moderately dilated. Left ventricular diastolic  parameters are consistent with Grade I diastolic dysfunction (impaired  relaxation). Elevated left  ventricular end-diastolic pressure.   2. Right ventricular systolic function is normal. The right ventricular  size is normal.   3. The mitral valve is normal in structure. Mild mitral valve  regurgitation. No evidence of mitral stenosis.   4. Calcified non coronary cusp . The aortic valve is tricuspid. There is  mild calcification of the aortic valve. Aortic valve regurgitation is not  visualized. Aortic valve sclerosis is present, with no evidence of aortic  valve stenosis.   5. The inferior vena cava is normal in size with greater than 50%  respiratory variability, suggesting right atrial pressure of 3 mmHg.  - Comparison 09/16/20: LVEF 35-40%, septal apical and anterior wall hypokinesis, grade II DD, elevated LVEDP, normal RVSF, mild MR, mild-moderate AV sclerosis without AS; 07/10/19: LVEF > 75%, no RWMA, grade 1 DD   RHC/LHC/PCI 01/11/21: SUMMARY Severe single-vessel CAD with 95% and 90% stenoses of the proximal and mid LAD at major 1st Diag branch.  Likely be culprit for reduced EF and anterior hypokinesis on  echo. Successful DES PCI of the LAD using an Onyx Frontier DES 2.25 mm x 34 mm postdilated in tapered fashion from 2.6 to 2.34m.   TIMI III flow pre and post, reduced to 0% Otherwise minimal CAD in the Right dominant system. 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 Moderate to severely reduced cardiac function with Cardiac Output-Index of 4.05-2.59 by Fick and 3.75-2.40 by Thermal Dilution.   Past Medical History:  Diagnosis Date   Abscess of dorsum of right hand 09/22/2020   Acute deep vein thrombosis (DVT) of right lower extremity ([facility] 12/13/2017   Allergic rhinitis    Anemia    Angio-edema    Anxiety    pt denies dx   Arthritis    Phreesia 10/21/2019   Asthma    Phreesia 10/21/2019   Breast cancer ([place] 1998   Left breast, in remission   Cataract    REMOVED   CHF (congestive heart failure) ([facility]    Clostridium difficile colitis 116/11/9602  Complication of anesthesia    "had hard time waking up from it several times" (02/20/2012)   Depression    "some; don't take anything for it" (02/20/2012), pt denies dx as of 06/02/21   Diverticulosis    DVT (deep venous thrombosis) ([place]    Dyspnea    all the time   Fibromyalgia 11/2011   GERD (gastroesophageal reflux disease)    Graves disease    Headache(784.0)    "related to allergies; more at different times during the year" (02/20/2012)   Hemorrhoids    Hiatal hernia    back and neck   History of blood transfusion 2023   1 unit, 5 units of Iron   Hx of adenomatous colonic polyps 04/12/2016   Hypercholesteremia    good cholesterol is high   Hypothyroidism    IBS (irritable bowel syndrome)    Moderate persistent asthma    -FeV1 72% 2011, -IgE 102 2011, CT sinus Neg 2011   Osteomyelitis of second toe of right foot ([place] 09/23/2020   Osteoporosis    on reclast yearly   Peripheral vascular disease ([facility] 2019   DVTs (3) lungs and leg   Pneumonia 04/2011; ~ 11/2011   "double; single" (02/20/2012)   Septic olecranon bursitis of right elbow 09/22/2020   Seronegative rheumatoid arthritis ([facility]    Dr. GLahoma Rocker  SIRS (systemic inflammatory response syndrome) ([facility] 02/10/2018   Sleep apnea    uses cpap nightly   Tracheobronchomalacia     Past  Surgical History:  Procedure Laterality Date   ABDOMINAL HYSTERECTOMY N/A    Phreesia 10/21/2019   ANTERIOR AND POSTERIOR REPAIR  1990's   APPENDECTOMY     ARGON LASER APPLICATION  154/10/8117  Procedure: ARGON LASER APPLICATION;  Surgeon: AYetta Flock MD;  [facility]: [facility];  Service: Gastroenterology;;   BREAST LUMPECTOMY  1998   left   BREAST SURGERY N/A    Phreesia 10/21/2019   BRONCHIAL WASHINGS  04/05/2020   Procedure: BRONCHIAL WASHINGS;  Surgeon: DFreddi Starr MD;  [facility]: [facility];  Service: Pulmonary;;   CARPOMETACARPEL ([place] FUSION OF THUMB WITH AUTOGRAFT FROM RADIUS  ~ 2009   "both thumbs" (02/20/2012)   CATARACT EXTRACTION W/ INTRAOCULAR LENS  IMPLANT, BILATERAL  2012   CERVICAL DISCECTOMY  10/2001   C5-C6   CERVICAL FUSION  2003   C3-C4   CHOLECYSTECTOMY     COLONOSCOPY     CORONARY STENT INTERVENTION N/A 01/11/2021   Procedure: CORONARY STENT INTERVENTION;  Surgeon: Leonie Man, MD;  Location: [place] [facility];  Service: Cardiovascular;  Laterality: N/A;   DEBRIDEMENT TENNIS ELBOW  ?1970's   right   ESOPHAGOGASTRODUODENOSCOPY     ESOPHAGOGASTRODUODENOSCOPY (EGD) WITH PROPOFOL N/A 02/05/2021   Procedure: ESOPHAGOGASTRODUODENOSCOPY (EGD) WITH PROPOFOL;  Surgeon: Yetta Flock, MD;  [facility];  Service: Gastroenterology;  Laterality: N/A;   EYE SURGERY N/A    Phreesia 10/21/2019   HYSTERECTOMY     I & D EXTREMITY Right 09/22/2020   Procedure: IRRIGATION AND DEBRIDEMENT RIGHT HAND AND ELBOW;  Surgeon: Marchia Bond, MD;  [facility];  Service: Orthopedics;  Laterality: Right;   KNEE ARTHROPLASTY  ?1990's   "?right; w/cartilage repair" (02/20/2012)   NASAL SEPTUM SURGERY  1980's   POSTERIOR CERVICAL FUSION/FORAMINOTOMY  2004   "failed initial fusion; rewired  anterior neck" (02/20/2012)   REVERSE SHOULDER ARTHROPLASTY Right 02/16/2020   Procedure: REVERSE SHOULDER ARTHROPLASTY;  Surgeon: Marchia Bond, MD;   [facility]: [facility];  Service: Orthopedics;  Laterality: Right;   RIGHT/LEFT HEART CATH AND CORONARY ANGIOGRAPHY N/A 01/11/2021   Procedure: RIGHT/LEFT HEART CATH AND CORONARY ANGIOGRAPHY;  Surgeon: Leonie Man, MD;  Location: [place] [facility];  Service: Cardiovascular;  Laterality: N/A;   SPINE SURGERY N/A    Phreesia 10/21/2019   TONSILLECTOMY  ~ 1953   VESICOVAGINAL FISTULA CLOSURE W/ TAH  1988   VIDEO BRONCHOSCOPY Bilateral 08/23/2016   Procedure: VIDEO BRONCHOSCOPY WITH FLUORO;  Surgeon: Javier Glazier, MD;  [facility];  Service: Cardiopulmonary;  Laterality: Bilateral;   VIDEO BRONCHOSCOPY N/A 04/05/2020   Procedure: VIDEO BRONCHOSCOPY WITHOUT FLUORO;  Surgeon: Freddi Starr, MD;  [facility];  Service: Pulmonary;  Laterality: N/A;    MEDICATIONS: No current facility-administered medications for this encounter.    acetaminophen (TYLENOL) 650 MG CR tablet   albuterol (VENTOLIN HFA) 108 (90 Base) MCG/ACT inhaler   allopurinol (ZYLOPRIM) 100 MG tablet   Alpha-D-Galactosidase (BEANO PO)   Alpha-Lipoic Acid 600 MG TABS   atorvastatin (LIPITOR) 80 MG tablet   azelastine (ASTELIN) 0.1 % nasal spray   benzonatate (TESSALON) 200 MG capsule   Calcium-Magnesium-Vitamin D (CALCIUM MAGNESIUM PO)   cetirizine (ZYRTEC) 10 MG tablet   cholecalciferol (VITAMIN D) 25 MCG (1000 UNIT) tablet   clopidogrel (PLAVIX) 75 MG tablet   cyproheptadine (PERIACTIN) 4 MG tablet   denosumab (PROLIA) 60 MG/ML SOSY injection   dexlansoprazole (DEXILANT) 60 MG capsule   diclofenac Sodium (VOLTAREN) 1 % GEL   empagliflozin (JARDIANCE) 10 MG TABS tablet   EPINEPHrine 0.3 mg/0.3 mL IJ SOAJ injection   famotidine (PEPCID) 20 MG tablet   fluticasone (FLONASE) 50 MCG/ACT nasal spray   folic acid (FOLVITE) 1 MG tablet   furosemide (LASIX) 40 MG tablet   guaiFENesin (MUCINEX) 600 MG 12 hr tablet   HIZENTRA 1 GM/5ML SOLN   isosorbide-hydrALAZINE (BIDIL) 20-37.5 MG tablet   Lactase  (LACTOSE FAST ACTING RELIEF PO)   lidocaine-prilocaine (EMLA) cream   magnesium oxide (MAG-OX) 400 (240 Mg) MG tablet   Menthol, Topical Analgesic, (BIOFREEZE) 4 % GEL   mirtazapine (REMERON SOL-TAB) 30 MG disintegrating tablet   montelukast (SINGULAIR) 10 MG tablet   Nystatin (GERHARDT'S BUTT CREAM) CREA   nystatin (MYCOSTATIN) 100000 UNIT/ML suspension   nystatin-triamcinolone  ointment (MYCOLOG)   potassium chloride SA (KLOR-CON M) 20 MEQ tablet   predniSONE (DELTASONE) 1 MG tablet   predniSONE (DELTASONE) 5 MG tablet   Prenatal Vit-Fe Fumarate-FA (PRENATAL VITAMINS PO)   Probiotic Product (ALIGN PO)   Propylene Glycol (SYSTANE COMPLETE) 0.6 % SOLN   Tiotropium Bromide-Olodaterol (STIOLTO RESPIMAT) 2.5-2.5 MCG/ACT AERS   traMADol (ULTRAM) 50 MG tablet   valsartan (DIOVAN) 80 MG tablet   XARELTO 20 MG TABS tablet   Glucose Blood (BLOOD GLUCOSE TEST STRIPS) STRP   LORazepam (ATIVAN) 0.5 MG tablet   NEOMYCIN-POLYMYXIN-HYDROCORTISONE (CORTISPORIN) 1 % SOLN OTIC solution    iohexol (OMNIPAQUE) 350 MG/ML injection    Myra Gianotti, PA-C Surgical Short Stay/Anesthesiology [facility] Phone [phone] [facility] Phone [phone] 08/25/2021 1:49 PM

## 2021-08-25 NOTE — Progress Notes (Signed)
PCP - Dr Mirna Mires Cardiologist - Dr Buford Dresser Pulmonology - Dr Freda Jackson Allergy & Asthma - Dr Allena Katz Hematology & Oncology - Dede Query, PA-C Rheumatology - Dr Vernelle Emerald  Chest x-ray - 02/04/21 (1V) EKG - 06/08/21 Stress Test - 01/03/20 (failed but passed nuclear test) ECHO - 05/25/21 Cardiac Cath - 01/11/21  ICD Pacemaker/Loop - n/a  Sleep Study -  Yes CPAP - uses CPAP nightly  Blood Thinner Instructions:  Hold Plavix 5 days prior to procedure.  Last dose of Plavix was on      Stop Xarelto 48 hours prior to procedure.   Aspirin Instructions: Continue ASA while off Plavix.  ERAS: ***  Anesthesia review: Yes  STOP now taking any Aspirin (unless otherwise instructed by your surgeon), Aleve, Naproxen, Ibuprofen, Motrin, Advil, Goody's, BC's, all herbal medications, fish oil, and all vitamins.   Coronavirus Screening Do you have any of the following symptoms:  Cough yes/no: No Fever (>100.75F)  yes/no: No Runny nose yes/no: No Sore throat yes/no: No Difficulty breathing/shortness of breath  yes/no: No  Have you traveled in the last 14 days and where? yes/no: No  Patient verbalized understanding of instructions that were given via phone.

## 2021-08-25 NOTE — Progress Notes (Signed)
PCP - Dr Jenna Luo Cardiologist - Dr Buford Dresser Allergy & Asthma - Dr Allena Katz Gastroenterology - Dr Silvano Rusk Oncology & Hematology - Dede Query, [place] Pulmonology - Dr Freda Jackson  Chest x-ray - 02/04/21 (1V) EKG - 06/08/21 Stress Test - 01/03/20 (failed but passed nuclear test) ECHO - 05/25/21 Cardiac Cath - n/a  ICD Pacemaker/Loop - n/a  Sleep Study -  Yes CPAP - uses CPAP nightly  DM, Type 2 Do not take Jardiance on Sunday or Monday DOS.  Blood Thinner Instructions:  Stop Xarelto 2 days prior procedure per MD.  Stop Plaxix 5 days prior to procedure per MD.    Aspirin Instructions: Continue ASA while off Plavix per MD.  Anesthesia review: Yes  STOP now taking any Aspirin (unless otherwise instructed by your surgeon), Aleve, Naproxen, Ibuprofen, Motrin, Advil, Goody's, BC's, all herbal medications, fish oil, and all vitamins.   Coronavirus Screening Do you have any of the following symptoms:  Cough yes/no: No Fever (>100.27F)  yes/no: No Runny nose yes/no: No Sore throat yes/no: No Difficulty breathing/shortness of breath  yes/no: No  Have you traveled in the last 14 days and where? yes/no: No  Patient verbalized understanding of instructions that were given via phone.

## 2021-08-25 NOTE — Anesthesia Preprocedure Evaluation (Signed)
Anesthesia Evaluation Anesthesia Physical Anesthesia Plan  ASA:   Anesthesia Plan:    Post-op Pain Management:    Induction:   PONV Risk Score and Plan:   Airway Management Planned:   Additional Equipment:   Intra-op Plan:   Post-operative Plan:   Informed Consent:   Plan Discussed with:   Anesthesia Plan Comments: (See PAT note written 08/25/2021 by Myra Gianotti, PA-C. )        Anesthesia Quick Evaluation

## 2021-08-27 DIAGNOSIS — J479 Bronchiectasis, uncomplicated: Secondary | ICD-10-CM | POA: Diagnosis not present

## 2021-08-27 DIAGNOSIS — J398 Other specified diseases of upper respiratory tract: Secondary | ICD-10-CM | POA: Diagnosis not present

## 2021-08-27 DIAGNOSIS — J45901 Unspecified asthma with (acute) exacerbation: Secondary | ICD-10-CM | POA: Diagnosis not present

## 2021-08-27 DIAGNOSIS — R059 Cough, unspecified: Secondary | ICD-10-CM | POA: Diagnosis not present

## 2021-08-28 ENCOUNTER — Ambulatory Visit (HOSPITAL_COMMUNITY): Payer: Medicare PPO | Admitting: Vascular Surgery

## 2021-08-28 ENCOUNTER — Ambulatory Visit (HOSPITAL_COMMUNITY)
Admission: RE | Admit: 2021-08-28 | Discharge: 2021-08-28 | Disposition: A | Discharge status: Home / Self Care | Payer: Medicare PPO | Attending: Podiatry | Admitting: Podiatry

## 2021-08-28 ENCOUNTER — Ambulatory Visit (HOSPITAL_COMMUNITY): Payer: Medicare PPO

## 2021-08-28 ENCOUNTER — Telehealth: Payer: Self-pay | Admitting: Internal Medicine

## 2021-08-28 ENCOUNTER — Encounter (HOSPITAL_COMMUNITY)
Admission: RE | Disposition: A | Discharge status: Home / Self Care | Payer: Self-pay | Source: Home / Self Care | Attending: Podiatry

## 2021-08-28 ENCOUNTER — Other Ambulatory Visit: Payer: Self-pay | Admitting: Podiatry

## 2021-08-28 ENCOUNTER — Encounter (HOSPITAL_COMMUNITY): Payer: Self-pay | Admitting: Podiatry

## 2021-08-28 ENCOUNTER — Encounter: Payer: Self-pay | Admitting: Podiatry

## 2021-08-28 ENCOUNTER — Ambulatory Visit (HOSPITAL_BASED_OUTPATIENT_CLINIC_OR_DEPARTMENT_OTHER): Payer: Medicare PPO | Admitting: Vascular Surgery

## 2021-08-28 ENCOUNTER — Other Ambulatory Visit: Payer: Self-pay

## 2021-08-28 DIAGNOSIS — M7989 Other specified soft tissue disorders: Secondary | ICD-10-CM

## 2021-08-28 DIAGNOSIS — Z86718 Personal history of other venous thrombosis and embolism: Secondary | ICD-10-CM | POA: Insufficient documentation

## 2021-08-28 DIAGNOSIS — M0600 Rheumatoid arthritis without rheumatoid factor, unspecified site: Secondary | ICD-10-CM | POA: Insufficient documentation

## 2021-08-28 DIAGNOSIS — K589 Irritable bowel syndrome without diarrhea: Secondary | ICD-10-CM | POA: Insufficient documentation

## 2021-08-28 DIAGNOSIS — Z7722 Contact with and (suspected) exposure to environmental tobacco smoke (acute) (chronic): Secondary | ICD-10-CM | POA: Diagnosis not present

## 2021-08-28 DIAGNOSIS — D509 Iron deficiency anemia, unspecified: Secondary | ICD-10-CM | POA: Insufficient documentation

## 2021-08-28 DIAGNOSIS — M67472 Ganglion, left ankle and foot: Secondary | ICD-10-CM | POA: Diagnosis not present

## 2021-08-28 DIAGNOSIS — Z86711 Personal history of pulmonary embolism: Secondary | ICD-10-CM | POA: Insufficient documentation

## 2021-08-28 DIAGNOSIS — M2041 Other hammer toe(s) (acquired), right foot: Secondary | ICD-10-CM | POA: Diagnosis not present

## 2021-08-28 DIAGNOSIS — Z7902 Long term (current) use of antithrombotics/antiplatelets: Secondary | ICD-10-CM | POA: Insufficient documentation

## 2021-08-28 DIAGNOSIS — I2510 Atherosclerotic heart disease of native coronary artery without angina pectoris: Secondary | ICD-10-CM | POA: Insufficient documentation

## 2021-08-28 DIAGNOSIS — Z9981 Dependence on supplemental oxygen: Secondary | ICD-10-CM | POA: Diagnosis not present

## 2021-08-28 DIAGNOSIS — Z955 Presence of coronary angioplasty implant and graft: Secondary | ICD-10-CM | POA: Diagnosis not present

## 2021-08-28 DIAGNOSIS — K219 Gastro-esophageal reflux disease without esophagitis: Secondary | ICD-10-CM | POA: Diagnosis not present

## 2021-08-28 DIAGNOSIS — E1151 Type 2 diabetes mellitus with diabetic peripheral angiopathy without gangrene: Secondary | ICD-10-CM | POA: Insufficient documentation

## 2021-08-28 DIAGNOSIS — E039 Hypothyroidism, unspecified: Secondary | ICD-10-CM

## 2021-08-28 DIAGNOSIS — D801 Nonfamilial hypogammaglobulinemia: Secondary | ICD-10-CM | POA: Insufficient documentation

## 2021-08-28 DIAGNOSIS — Z9049 Acquired absence of other specified parts of digestive tract: Secondary | ICD-10-CM | POA: Insufficient documentation

## 2021-08-28 DIAGNOSIS — Z853 Personal history of malignant neoplasm of breast: Secondary | ICD-10-CM | POA: Diagnosis not present

## 2021-08-28 DIAGNOSIS — E0500 Thyrotoxicosis with diffuse goiter without thyrotoxic crisis or storm: Secondary | ICD-10-CM | POA: Insufficient documentation

## 2021-08-28 DIAGNOSIS — M797 Fibromyalgia: Secondary | ICD-10-CM | POA: Diagnosis not present

## 2021-08-28 DIAGNOSIS — J4540 Moderate persistent asthma, uncomplicated: Secondary | ICD-10-CM | POA: Diagnosis not present

## 2021-08-28 DIAGNOSIS — Z7901 Long term (current) use of anticoagulants: Secondary | ICD-10-CM | POA: Diagnosis not present

## 2021-08-28 DIAGNOSIS — D638 Anemia in other chronic diseases classified elsewhere: Secondary | ICD-10-CM

## 2021-08-28 DIAGNOSIS — I255 Ischemic cardiomyopathy: Secondary | ICD-10-CM | POA: Insufficient documentation

## 2021-08-28 DIAGNOSIS — M24574 Contracture, right foot: Secondary | ICD-10-CM | POA: Diagnosis not present

## 2021-08-28 DIAGNOSIS — I2729 Other secondary pulmonary hypertension: Secondary | ICD-10-CM | POA: Insufficient documentation

## 2021-08-28 DIAGNOSIS — E7800 Pure hypercholesterolemia, unspecified: Secondary | ICD-10-CM | POA: Insufficient documentation

## 2021-08-28 DIAGNOSIS — Z7952 Long term (current) use of systemic steroids: Secondary | ICD-10-CM | POA: Insufficient documentation

## 2021-08-28 DIAGNOSIS — G4730 Sleep apnea, unspecified: Secondary | ICD-10-CM | POA: Diagnosis not present

## 2021-08-28 DIAGNOSIS — I5042 Chronic combined systolic (congestive) and diastolic (congestive) heart failure: Secondary | ICD-10-CM | POA: Insufficient documentation

## 2021-08-28 DIAGNOSIS — Z7984 Long term (current) use of oral hypoglycemic drugs: Secondary | ICD-10-CM | POA: Insufficient documentation

## 2021-08-28 DIAGNOSIS — I509 Heart failure, unspecified: Secondary | ICD-10-CM | POA: Diagnosis not present

## 2021-08-28 DIAGNOSIS — M71372 Other bursal cyst, left ankle and foot: Secondary | ICD-10-CM | POA: Diagnosis not present

## 2021-08-28 DIAGNOSIS — S92321A Displaced fracture of second metatarsal bone, right foot, initial encounter for closed fracture: Secondary | ICD-10-CM | POA: Diagnosis not present

## 2021-08-28 DIAGNOSIS — K449 Diaphragmatic hernia without obstruction or gangrene: Secondary | ICD-10-CM | POA: Insufficient documentation

## 2021-08-28 HISTORY — DX: Atherosclerotic heart disease of native coronary artery without angina pectoris: I25.10

## 2021-08-28 HISTORY — PX: HAMMER TOE SURGERY: SHX385

## 2021-08-28 HISTORY — PX: MASS EXCISION: SHX2000

## 2021-08-28 HISTORY — PX: CAPSULOTOMY: SHX379

## 2021-08-28 LAB — GLUCOSE, CAPILLARY: Glucose-Capillary: 105 mg/dL — ABNORMAL HIGH (ref 70–99)

## 2021-08-28 SURGERY — CORRECTION, HAMMER TOE
Anesthesia: Monitor Anesthesia Care | Site: Toe | Laterality: Right

## 2021-08-28 MED ORDER — CEFAZOLIN SODIUM-DEXTROSE 2-4 GM/100ML-% IV SOLN
2.0000 g | Freq: Once | INTRAVENOUS | Status: AC
  Administered 2021-08-28: 2 g via INTRAVENOUS
  Filled 2021-08-28: qty 100

## 2021-08-28 MED ORDER — FENTANYL CITRATE (PF) 250 MCG/5ML IJ SOLN
INTRAMUSCULAR | Status: AC
  Filled 2021-08-28: qty 5

## 2021-08-28 MED ORDER — ONDANSETRON HCL 4 MG/2ML IJ SOLN: 4.0000 mg | Freq: Once | INTRAMUSCULAR | Status: DC | PRN

## 2021-08-28 MED ORDER — LIDOCAINE HCL (CARDIAC) PF 100 MG/5ML IV SOSY
PREFILLED_SYRINGE | INTRAVENOUS | Status: DC | PRN
  Administered 2021-08-28: 60 mg via INTRATRACHEAL

## 2021-08-28 MED ORDER — INSULIN ASPART 100 UNIT/ML IJ SOLN: 0.0000 [IU] | INTRAMUSCULAR | Status: DC | PRN

## 2021-08-28 MED ORDER — LIDOCAINE 2% (20 MG/ML) 5 ML SYRINGE
INTRAMUSCULAR | Status: AC
  Filled 2021-08-28: qty 15

## 2021-08-28 MED ORDER — ALBUTEROL SULFATE HFA 108 (90 BASE) MCG/ACT IN AERS
INHALATION_SPRAY | RESPIRATORY_TRACT | Status: AC
  Filled 2021-08-28: qty 6.7

## 2021-08-28 MED ORDER — DEXAMETHASONE SODIUM PHOSPHATE 10 MG/ML IJ SOLN
INTRAMUSCULAR | Status: AC
  Filled 2021-08-28: qty 3

## 2021-08-28 MED ORDER — KETAMINE HCL 50 MG/5ML IJ SOSY
PREFILLED_SYRINGE | INTRAMUSCULAR | Status: AC
  Filled 2021-08-28: qty 5

## 2021-08-28 MED ORDER — LIDOCAINE-EPINEPHRINE 1 %-1:100000 IJ SOLN
INTRAMUSCULAR | Status: DC | PRN
  Administered 2021-08-28: 10 mL

## 2021-08-28 MED ORDER — ROCURONIUM BROMIDE 10 MG/ML (PF) SYRINGE
PREFILLED_SYRINGE | INTRAVENOUS | Status: AC
  Filled 2021-08-28: qty 10

## 2021-08-28 MED ORDER — LIDOCAINE-EPINEPHRINE 1 %-1:100000 IJ SOLN
INTRAMUSCULAR | Status: AC
  Filled 2021-08-28: qty 1

## 2021-08-28 MED ORDER — FENTANYL CITRATE (PF) 250 MCG/5ML IJ SOLN
INTRAMUSCULAR | Status: DC | PRN
  Administered 2021-08-28 (×5): 50 ug via INTRAVENOUS

## 2021-08-28 MED ORDER — LACTATED RINGERS IV SOLN
INTRAVENOUS | Status: DC
Start: 2021-08-28 — End: 2021-08-28

## 2021-08-28 MED ORDER — 0.9 % SODIUM CHLORIDE (POUR BTL) OPTIME
TOPICAL | Status: DC | PRN
  Administered 2021-08-28: 1000 mL

## 2021-08-28 MED ORDER — BUPIVACAINE HCL (PF) 0.5 % IJ SOLN
INTRAMUSCULAR | Status: AC
  Filled 2021-08-28: qty 30

## 2021-08-28 MED ORDER — MIDAZOLAM HCL 2 MG/2ML IJ SOLN
INTRAMUSCULAR | Status: AC
  Filled 2021-08-28: qty 2

## 2021-08-28 MED ORDER — CHLORHEXIDINE GLUCONATE 0.12 % MT SOLN
15.0000 mL | Freq: Once | OROMUCOSAL | Status: AC
  Administered 2021-08-28: 15 mL via OROMUCOSAL
  Filled 2021-08-28: qty 15

## 2021-08-28 MED ORDER — PHENYLEPHRINE HCL-NACL 20-0.9 MG/250ML-% IV SOLN
INTRAVENOUS | Status: DC | PRN
  Administered 2021-08-28: 20 ug/min via INTRAVENOUS

## 2021-08-28 MED ORDER — ORAL CARE MOUTH RINSE: 15.0000 mL | Freq: Once | OROMUCOSAL | Status: AC

## 2021-08-28 MED ORDER — ALBUTEROL SULFATE HFA 108 (90 BASE) MCG/ACT IN AERS
INHALATION_SPRAY | RESPIRATORY_TRACT | Status: DC | PRN
  Administered 2021-08-28: 2 via RESPIRATORY_TRACT

## 2021-08-28 MED ORDER — PROPOFOL 10 MG/ML IV BOLUS
INTRAVENOUS | Status: DC | PRN
  Administered 2021-08-28: 20 mg via INTRAVENOUS

## 2021-08-28 MED ORDER — MEPERIDINE HCL 25 MG/ML IJ SOLN: 6.2500 mg | INTRAMUSCULAR | Status: DC | PRN

## 2021-08-28 MED ORDER — OXYCODONE HCL 5 MG PO TABS: 5.0000 mg | ORAL_TABLET | Freq: Once | ORAL | Status: DC | PRN

## 2021-08-28 MED ORDER — BUPIVACAINE HCL (PF) 0.5 % IJ SOLN
INTRAMUSCULAR | Status: DC | PRN
  Administered 2021-08-28: 10 mL

## 2021-08-28 MED ORDER — MIDAZOLAM HCL 2 MG/2ML IJ SOLN
INTRAMUSCULAR | Status: DC | PRN
  Administered 2021-08-28: 1 mg via INTRAVENOUS

## 2021-08-28 MED ORDER — OXYCODONE HCL 5 MG/5ML PO SOLN: 5.0000 mg | Freq: Once | ORAL | Status: DC | PRN

## 2021-08-28 MED ORDER — ONDANSETRON HCL 4 MG/2ML IJ SOLN
INTRAMUSCULAR | Status: AC
  Filled 2021-08-28: qty 6

## 2021-08-28 MED ORDER — FENTANYL CITRATE (PF) 100 MCG/2ML IJ SOLN: 25.0000 ug | INTRAMUSCULAR | Status: DC | PRN

## 2021-08-28 MED ORDER — PROPOFOL 500 MG/50ML IV EMUL
INTRAVENOUS | Status: DC | PRN
  Administered 2021-08-28: 25 ug/kg/min via INTRAVENOUS

## 2021-08-28 MED ORDER — OXYCODONE-ACETAMINOPHEN 5-325 MG PO TABS: 1.0000 | ORAL_TABLET | ORAL | 0 refills | Status: DC | PRN

## 2021-08-28 MED ORDER — ACETAMINOPHEN 160 MG/5ML PO SOLN: 325.0000 mg | ORAL | Status: DC | PRN

## 2021-08-28 MED ORDER — ACETAMINOPHEN 325 MG PO TABS: 325.0000 mg | ORAL_TABLET | ORAL | Status: DC | PRN

## 2021-08-28 MED ORDER — ONDANSETRON HCL 4 MG PO TABS: 4.0000 mg | ORAL_TABLET | Freq: Three times a day (TID) | ORAL | 0 refills | Status: DC | PRN

## 2021-08-28 MED ORDER — KETAMINE HCL 10 MG/ML IJ SOLN
INTRAMUSCULAR | Status: DC | PRN
  Administered 2021-08-28: 5 mg via INTRAVENOUS
  Administered 2021-08-28: 25 mg via INTRAVENOUS

## 2021-08-28 SURGICAL SUPPLY — 69 items
BAG COUNTER SPONGE SURGICOUNT (BAG) ×4 IMPLANT
BAG SPNG CNTER NS LX DISP (BAG) ×3
BLADE AVERAGE 25X9 (BLADE) IMPLANT
BLADE MINI RND TIP GREEN BEAV (BLADE) IMPLANT
BLADE OSCILLATING/SAGITTAL (BLADE) ×4
BLADE SW THK.38XMED NAR THN (BLADE) IMPLANT
BNDG CMPR 9X4 STRL LF SNTH (GAUZE/BANDAGES/DRESSINGS) ×3
BNDG COHESIVE 1X5 TAN STRL LF (GAUZE/BANDAGES/DRESSINGS) IMPLANT
BNDG COHESIVE 6X5 TAN STRL LF (GAUZE/BANDAGES/DRESSINGS) IMPLANT
BNDG ELASTIC 4X5.8 VLCR STR LF (GAUZE/BANDAGES/DRESSINGS) ×1 IMPLANT
BNDG ESMARK 4X9 LF (GAUZE/BANDAGES/DRESSINGS) ×4 IMPLANT
BNDG GAUZE ELAST 4 BULKY (GAUZE/BANDAGES/DRESSINGS) IMPLANT
CORD BIPOLAR FORCEPS 12FT (ELECTRODE) ×4 IMPLANT
COTTON STERILE ROLL (GAUZE/BANDAGES/DRESSINGS) IMPLANT
COVER SURGICAL LIGHT HANDLE (MISCELLANEOUS) ×8 IMPLANT
CUFF TOURN SGL QUICK 18X4 (TOURNIQUET CUFF) IMPLANT
CUFF TOURN SGL QUICK 24 (TOURNIQUET CUFF)
CUFF TOURN SGL QUICK 34 (TOURNIQUET CUFF)
CUFF TOURN SGL QUICK 42 (TOURNIQUET CUFF) IMPLANT
CUFF TRNQT CYL 24X4X16.5-23 (TOURNIQUET CUFF) IMPLANT
CUFF TRNQT CYL 34X4.125X (TOURNIQUET CUFF) IMPLANT
DRAPE OEC MINIVIEW 54X84 (DRAPES) IMPLANT
DRAPE U-SHAPE 47X51 STRL (DRAPES) ×4 IMPLANT
DRSG ADAPTIC 3X8 NADH LF (GAUZE/BANDAGES/DRESSINGS) IMPLANT
DURAPREP 26ML APPLICATOR (WOUND CARE) ×4 IMPLANT
ELECT REM PT RETURN 9FT ADLT (ELECTROSURGICAL) ×4
ELECTRODE REM PT RTRN 9FT ADLT (ELECTROSURGICAL) ×3 IMPLANT
GAUZE SPONGE 2X2 8PLY STRL LF (GAUZE/BANDAGES/DRESSINGS) IMPLANT
GAUZE SPONGE 4X4 12PLY STRL (GAUZE/BANDAGES/DRESSINGS) ×1 IMPLANT
GAUZE XEROFORM 5X9 LF (GAUZE/BANDAGES/DRESSINGS) ×1 IMPLANT
GLOVE BIOGEL PI IND STRL 9 (GLOVE) ×3 IMPLANT
GLOVE BIOGEL PI INDICATOR 9 (GLOVE) ×1
GLOVE SURG ORTHO 9.0 STRL STRW (GLOVE) ×4 IMPLANT
GOWN STRL REUS W/ TWL XL LVL3 (GOWN DISPOSABLE) ×6 IMPLANT
GOWN STRL REUS W/TWL XL LVL3 (GOWN DISPOSABLE) ×8
GUIDEWIRE ORTH 1.1XCAN SCR SYS (WIRE) IMPLANT
K-WIRE 1.1 (WIRE) ×4
K-WIRE DBL POINT .045X9 ×4 IMPLANT
K-WIRE THRD .62X9 BAY  6PK (WIRE) ×4
K-WIRE THRD .62X9 BAY 6PK (WIRE) ×3
KIT BASIN OR (CUSTOM PROCEDURE TRAY) ×4 IMPLANT
KIT TURNOVER KIT B (KITS) ×4 IMPLANT
KWIRE DBL POINT .045X9 IMPLANT
KWIRE THRD .62X9 BAY 6PK (WIRE) IMPLANT
MANIFOLD NEPTUNE II (INSTRUMENTS) ×4 IMPLANT
NDL HYPO 25GX1X1/2 BEV (NEEDLE) IMPLANT
NEEDLE HYPO 25GX1X1/2 BEV (NEEDLE) IMPLANT
NS IRRIG 1000ML POUR BTL (IV SOLUTION) ×4 IMPLANT
PACK ORTHO EXTREMITY (CUSTOM PROCEDURE TRAY) ×4 IMPLANT
PAD ARMBOARD 7.5X6 YLW CONV (MISCELLANEOUS) ×8 IMPLANT
PAD CAST 4YDX4 CTTN HI CHSV (CAST SUPPLIES) IMPLANT
PADDING CAST COTTON 4X4 STRL (CAST SUPPLIES)
PIN CAPS ORTHO GREEN .062 (PIN) ×1 IMPLANT
SPECIMEN JAR SMALL (MISCELLANEOUS) ×4 IMPLANT
SPONGE GAUZE 2X2 STER 10/PKG (GAUZE/BANDAGES/DRESSINGS)
STAPLER VISISTAT (STAPLE) ×1 IMPLANT
SUCTION FRAZIER HANDLE 10FR (MISCELLANEOUS)
SUCTION TUBE FRAZIER 10FR DISP (MISCELLANEOUS) IMPLANT
SUT ETHILON 2 0 PSLX (SUTURE) IMPLANT
SUT MNCRL AB 4-0 PS2 18 (SUTURE) ×2 IMPLANT
SUT PROLENE 3 0 PS 2 (SUTURE) ×2 IMPLANT
SUT SILK 2 0 TIES 17X18 (SUTURE) ×4
SUT SILK 2-0 18XBRD TIE BLK (SUTURE) IMPLANT
SUT VIC AB 2-0 FS1 27 (SUTURE) IMPLANT
SYR CONTROL 10ML LL (SYRINGE) IMPLANT
TOWEL GREEN STERILE (TOWEL DISPOSABLE) ×4 IMPLANT
TOWEL GREEN STERILE FF (TOWEL DISPOSABLE) ×4 IMPLANT
TUBE CONNECTING 12X1/4 (SUCTIONS) IMPLANT
WATER STERILE IRR 1000ML POUR (IV SOLUTION) ×4 IMPLANT

## 2021-08-28 NOTE — Anesthesia Postprocedure Evaluation (Signed)
Anesthesia Post Note  Patient: Ebony Scott  Procedure(s) Performed: HAMMER TOE REPAIR 2ND TOE RIGHT FOOT (Right: Toe) CAPSULOTOMY (Right) EXCISION OF SOFT TISSUE  MASS LEFT FOOT (Left)     Patient location during evaluation: PACU Anesthesia Type: MAC Level of consciousness: awake and alert Pain management: pain level controlled Vital Signs Assessment: post-procedure vital signs reviewed and stable Respiratory status: spontaneous breathing, nonlabored ventilation, respiratory function stable and patient connected to nasal cannula oxygen Cardiovascular status: stable and blood pressure returned to baseline Postop Assessment: no apparent nausea or vomiting Anesthetic complications: no   No notable events documented.  Last Vitals:  Vitals:   08/28/21 1245 08/28/21 1256  BP: (!) 128/49 (!) 118/41  Pulse: 76 78  Resp: 16 15  Temp: 36.7 C   SpO2: 93% 94%    Last Pain:  Vitals:   08/28/21 1245  TempSrc:   PainSc: 0-No pain                 Jaimin Krupka

## 2021-08-28 NOTE — Telephone Encounter (Signed)
Returned call to patient and advised patient Dr. Benjamine Mola is out of the office. Patient advised that her message has been forwarded to Dr. Benjamine Mola as he is checking his messages periodically. Patient advised I do not have a timeline on when he will respond. Patient advised if she is in that much pain that she should seek evaluation at urgent care or the emergency room. Patient expressed understanding.

## 2021-08-28 NOTE — Op Note (Signed)
Surgeon: Surgeon(s): Felipa Furnace, DPM  Assistants: None Pre-operative diagnosis: HAMMER TOE, SOFT TISSUE MASS,  Post-operative diagnosis: same Procedure: Procedure(s) (LRB): HAMMER TOE REPAIR 2ND TOE RIGHT FOOT (Right) CAPSULOTOMY (Right) EXCISION OF SOFT TISSUE  MASS LEFT FOOT (Left)  Pathology:  ID Type Source Tests Collected by Time Destination  1 : left foot soft tissue mass Tissue PATH Soft tissue resection SURGICAL PATHOLOGY Felipa Furnace, DPM 08/28/2021 1130     Pertinent Intra-op findings: Hammertoe contracture noted of the right second and soft tissue mass to the left dorsal foot Anesthesia: Monitor Anesthesia Care  Hemostasis:  Total Tourniquet Time Documented: Ankle (N/A) - 41 minutes Ankle (N/A) - 41 minutes Total: Ankle (N/A) - 82 minutes  EBL: Minimal Materials: 3-0 Prolene and 3-0 Monocryl and 0.45 K wire Injectables: 20 cc of one-to-one mixture 1% lidocaine plain half percent Marcaine plain Complications: None  Indications for surgery: A 71 y.o. female presents with right second digit hammertoe contracture with second metatarsophalangeal joint contracture and left dorsal soft tissue mass. Patient has failed all conservative therapy including but not limited to injection immobilization offloading shoe gear modification. She wishes to have surgical correction of the foot/deformity. It was determined that patient would benefit from right second metatarsophalangeal joint capsulotomy with right second digit hammertoe arthroplasty and left excision of dorsal soft tissue mass. Informed surgical risk consent was reviewed and read aloud to the patient.  I reviewed the films.  I have discussed my findings with the patient in great detail.  I have discussed all risks including but not limited to infection, stiffness, scarring, limp, disability, deformity, damage to blood vessels and nerves, numbness, poor healing, need for braces, arthritis, chronic pain, amputation, death.  All  benefits and realistic expectations discussed in great detail.  I have made no promises as to the outcome.  I have provided realistic expectations.  I have offered the patient a 2nd opinion, which they have declined and assured me they preferred to proceed despite the risks   Procedure in detail: The patient was both verbally and visually identified by myself, the nursing staff, and anesthesia staff in the preoperative holding area. They were then transferred to the operating room and placed on the operative table in supine position.  Attention was directed to the right second digit.  Skin marker was used to delineate the incision dorsally longitudinally.  Using #15 blade incision was carried down from the dermal junction down to the level of the tendon.  Transverse tenotomy was performed at the head of the PIPJ joint.  After tenotomy the PIPJ joint was exposed.  Using sagittal saw the head arthroplasty was performed in standard technique.  Attention was then directed to the second metatarsophalangeal joint as there is still contracture present.  Using McGlamry elevator the contracture was released in standard technique good correction alignment noted at this time.  A 0.045 K wire was anterograde followed by retrograded into the metatarsophalangeal joint.  Good correction alignment noted.  The wound was primary closed with 3-0 Monocryl and 3-0 Prolene.  Attention was directed left dorsal foot.  An S shaped curvilinear incision was delineated using skin marker.  Using #15 blade the incision was carried down to dermal junction down to the level of the soft tissue mass.  It is important noted ganglion cyst was noted at this time.  The mass was excised out in its entirety and sent to pathology in standard technique.  No complication noted.  The wound was closed with  3-0 Monocryl and skin staples  At the conclusion of the procedure the patient was awoken from anesthesia and found to have tolerated the procedure  well any complications. There were transferred to PACU with vital signs stable and vascular status intact.  Boneta Lucks, DPM

## 2021-08-28 NOTE — Transfer of Care (Signed)
Immediate Anesthesia Transfer of Care Note  Patient: GEET HOSKING  Procedure(s) Performed: HAMMER TOE REPAIR 2ND TOE RIGHT FOOT (Right: Toe) CAPSULOTOMY (Right) EXCISION OF SOFT TISSUE  MASS LEFT FOOT (Left)  Patient Location: PACU  Anesthesia Type:MAC  Level of Consciousness: awake, alert  and oriented  Airway & Oxygen Therapy: Patient Spontanous Breathing  Post-op Assessment: Report given to RN and Post -op Vital signs reviewed and stable  Post vital signs: Reviewed and stable  Last Vitals:  Vitals Value Taken Time  BP 139/62 08/28/21 1158  Temp    Pulse 79 08/28/21 1200  Resp 12 08/28/21 1200  SpO2 93 % 08/28/21 1200  Vitals shown include unvalidated device data.  Last Pain:  Vitals:   08/28/21 0842  TempSrc:   PainSc: 8       Patients Stated Pain Goal: 4 (12/92/90 9030)  Complications: No notable events documented.

## 2021-08-28 NOTE — Telephone Encounter (Signed)
Patient left a voicemail stating she called last week about getting some pain medicine because her fingers were swollen and hurting constantly.  Patient states she did not get a return call.  Patient states she is currently having a procedure and is using her son's phone [phone]).  Patient states if she doesn't answer, please feel free to give her son the information.  Patient states her pharmacy is [facility].

## 2021-08-28 NOTE — Interval H&P Note (Signed)
History and Physical Interval Note:  08/28/2021 9:59 AM  [name]  has presented today for surgery, with the diagnosis of HAMMER TOE, SOFT TISSUE MASS,.  The various methods of treatment have been discussed with the patient and family. After consideration of risks, benefits and other options for treatment, the patient has consented to  Procedure(s): HAMMER TOE REPAIR 2ND TOE RIGHT FOOT (Right) CAPSULOTOMY (Right) EXCISION OF SOFT TISSUE  MASS LEFT FOOT (Left) as a surgical intervention.  The patient's history has been reviewed, patient examined, no change in status, stable for surgery.  I have reviewed the patient's chart and labs.  Questions were answered to the patient's satisfaction.     Ebony Scott

## 2021-08-28 NOTE — Discharge Instructions (Signed)
After Surgery Instructions   1) If you are recuperating from surgery anywhere other than home, please be sure to leave us the number where you can be reached.  2) Go directly home and rest.  3) Keep the operated foot(feet) elevated six inches above the hip when sitting or lying down. This will help control swelling and pain.  4) Support the elevated foot and leg with pillows. DO NOT PLACE PILLOWS UNDER THE KNEE.  5) DO NOT REMOVE or get your bandages WET, unless you were given different instructions by your doctor to do so. This increases the risk of infection.  6) Wear your surgical shoe or surgical boot at all times when you are up on your feet.  7) A limited amount of pain and swelling may occur. The skin may take on a bruised appearance. DO NOT BE ALARMED, THIS IS NORMAL.  8) For slight pain and swelling, apply an ice pack directly over the bandages for 15 minutes only out of each hour of the day. Continue until seen in the office for your first post op visit. DON NOT     APPLY ANY FORM OF HEAT TO THE AREA.  9) Have prescriptions filled immediately and take as directed.  10) Drink lots of liquids, water and juice to stay hydrated.  11) CALL IMMEDIATELY IF:  *Bleeding continues until the following day of surgery  *Pain increases and/or does not respond to medication  *Bandages or cast appears to tight  *If your bandage gets wet  *Trip, fall or stump your surgical foot  *If your temperature goes above 101  *If you have ANY questions at all  YOU NOW CONTROL THE EFFORT OF YOUR RECOVERY. ADHERING TO THESE INSTRUCTIONS WILL OFFER YOU THE MOST COMPLETE RESULTS  

## 2021-08-29 LAB — SURGICAL PATHOLOGY

## 2021-08-29 MED ORDER — PREDNISONE 5 MG PO TABS: ORAL_TABLET | ORAL | 0 refills | Status: AC

## 2021-08-29 NOTE — Telephone Encounter (Signed)
I spoke with Ebony Scott she had her foot surgery yesterday which had no major problems but has a lot of pain at the surgical site last night and today. She was prescribed oxycodone 5 mg PRN for this which is helpful when taken. Also has been seeing increased swelling in  her hands that was worse last night than previously. She has slight leg swelling. I recommend she can try increase prednisone dose temporarily since RA related inflammation can increase after surgery or other stressor up to 20 mg daily tapering back to her baseline after 9 days. She can contact us again if not improving by next week on this.

## 2021-08-29 NOTE — Addendum Note (Signed)
Addended by: Collier Salina on: 08/29/2021 08:33 AM   Modules accepted: Orders

## 2021-08-30 ENCOUNTER — Inpatient Hospital Stay: Payer: Medicare PPO

## 2021-08-30 ENCOUNTER — Inpatient Hospital Stay: Payer: Medicare PPO | Admitting: Hematology and Oncology

## 2021-08-31 ENCOUNTER — Telehealth: Payer: Self-pay | Admitting: *Deleted

## 2021-08-31 MED ORDER — SULFAMETHOXAZOLE-TRIMETHOPRIM 800-160 MG PO TABS: 1.0000 | ORAL_TABLET | Freq: Two times a day (BID) | ORAL | 0 refills | Status: DC

## 2021-08-31 NOTE — Telephone Encounter (Signed)
Patient notified, verbalized understanding

## 2021-08-31 NOTE — Telephone Encounter (Addendum)
Patient daughter is calling with concerns about patient, low grade temp(99.4), no appetite, has been taking extra strength tylenol, stopped taking pain medicine 1 day ago, still feels the same,today she is lethargic.  She does not know what to do at this point. Spoke with patient, suggested she go to Urgent Care, said that she cannot get there.Please advise. Patient has been notified that medication has been sent to pharmacy.

## 2021-09-04 ENCOUNTER — Other Ambulatory Visit (HOSPITAL_COMMUNITY): Payer: Self-pay

## 2021-09-04 ENCOUNTER — Encounter (HOSPITAL_COMMUNITY): Payer: Self-pay | Admitting: Podiatry

## 2021-09-04 ENCOUNTER — Telehealth: Payer: Self-pay | Admitting: *Deleted

## 2021-09-04 NOTE — Telephone Encounter (Signed)
Patient is calling with concerns about sharp pains going thru her post surgical feet day and night,causing problems with sleep, what can be done to help. Patient does not want to take any harsh pain medicines, is currently taking extra strength arthritis-1300 mg 2-3 times daily, seems to help some, encouraged her to apply ice pack to behind the knee and to elevate. Please advise.

## 2021-09-05 ENCOUNTER — Other Ambulatory Visit (HOSPITAL_COMMUNITY): Payer: Self-pay

## 2021-09-05 ENCOUNTER — Other Ambulatory Visit: Payer: Self-pay | Admitting: Podiatry

## 2021-09-05 MED ORDER — GABAPENTIN 100 MG PO CAPS: 100.0000 mg | ORAL_CAPSULE | Freq: Three times a day (TID) | ORAL | 3 refills | Status: DC

## 2021-09-05 NOTE — Telephone Encounter (Signed)
Called patient to notify that medication has been sent to pharmacy.

## 2021-09-06 ENCOUNTER — Ambulatory Visit (INDEPENDENT_AMBULATORY_CARE_PROVIDER_SITE_OTHER): Payer: Medicare PPO

## 2021-09-06 ENCOUNTER — Ambulatory Visit (INDEPENDENT_AMBULATORY_CARE_PROVIDER_SITE_OTHER): Payer: Medicare PPO | Admitting: Podiatry

## 2021-09-06 ENCOUNTER — Ambulatory Visit: Payer: Medicare PPO

## 2021-09-06 DIAGNOSIS — M24574 Contracture, right foot: Secondary | ICD-10-CM

## 2021-09-06 DIAGNOSIS — M2041 Other hammer toe(s) (acquired), right foot: Secondary | ICD-10-CM

## 2021-09-06 DIAGNOSIS — Z9889 Other specified postprocedural states: Secondary | ICD-10-CM

## 2021-09-06 DIAGNOSIS — M7989 Other specified soft tissue disorders: Secondary | ICD-10-CM

## 2021-09-06 DIAGNOSIS — Z09 Encounter for follow-up examination after completed treatment for conditions other than malignant neoplasm: Secondary | ICD-10-CM

## 2021-09-06 NOTE — Progress Notes (Signed)
Subjective:  Patient ID: Ebony Scott, female    DOB: 04-05-1950,  MRN: [id number]  Chief Complaint  Patient presents with   Routine Post Op    Follow up on  RIGHT 2ND HAMMERTOE WITH 2ND CAPSULOTOMY OF MPJ AND LEFT EXC OF SOFT TISSUE MASS, Patient states pan level is at a 6, Patient request nail trim    DOS: 08/28/2021 Procedure: Right second digit hammertoe correction with PIPJ arthroplasty and capsulotomy with left excision of soft tissue mass.  71 y.o. female returns for post-op check.  Patient states she is doing okay.  Pain is controlled.  She has not any gabapentin she will be picking up today to pick up gabapentin.  Denies any other acute complaints weightbearing as tolerated with surgical shoe bilaterally  Review of Systems: Negative except as noted in the HPI. Denies N/V/F/Ch.  Past Medical History:  Diagnosis Date   Abscess of dorsum of right hand 09/22/2020   Acute deep vein thrombosis (DVT) of right lower extremity ([place]) 12/13/2017   Allergic rhinitis    Anemia    Angio-edema    Anxiety    pt denies dx   Arthritis    Phreesia 10/21/2019   Asthma    Phreesia 10/21/2019   Breast cancer ([place]) 1998   Left breast, in remission   Cataract    REMOVED   CHF (congestive heart failure) ([place])    Clostridium difficile colitis 39/76/7341   Complication of anesthesia    "had hard time waking up from it several times" (02/20/2012), no anesthesia problems since 2014   Coronary artery disease    Depression    "some; don't take anything for it" (02/20/2012), pt denies dx as of 06/02/21   Diverticulosis    DVT (deep venous thrombosis) ([place])    Dyspnea    all the time   Fibromyalgia 11/2011   GERD (gastroesophageal reflux disease)    Graves disease    Headache(784.0)    "related to allergies; more at different times during the year" (02/20/2012)   Hemorrhoids    Hiatal hernia    back and neck   History of blood transfusion 2023   1 unit, 5 units of Iron   Hx of adenomatous  colonic polyps 04/12/2016   Hypercholesteremia    good cholesterol is high   Hypothyroidism    IBS (irritable bowel syndrome)    Moderate persistent asthma    -FeV1 72% 2011, -IgE 102 2011, CT sinus Neg 2011   Osteomyelitis of second toe of right foot ([place]) 09/23/2020   Osteoporosis    on reclast yearly   Peripheral vascular disease ([place]) 2019   DVTs (3) lungs and leg   Pneumonia 04/2011; ~ 11/2011   "double; single" (02/20/2012)   Septic olecranon bursitis of right elbow 09/22/2020   Seronegative rheumatoid arthritis ([place])    Dr. Lahoma Rocker   SIRS (systemic inflammatory response syndrome) ([place]) 02/10/2018   Sleep apnea    uses cpap nightly   Tracheobronchomalacia     Current Outpatient Medications:    acetaminophen (TYLENOL) 650 MG CR tablet, Take 1,300 mg by mouth every 8 (eight) hours as needed for pain., Disp: , Rfl:    albuterol (VENTOLIN HFA) 108 (90 Base) MCG/ACT inhaler, Inhale 2 puffs into the lungs every 6 (six) hours as needed for wheezing or shortness of breath., Disp: 19 g, Rfl: 1   allopurinol (ZYLOPRIM) 100 MG tablet, Take 1 tablet (100 mg total) by mouth daily., Disp: 90 tablet, Rfl: 3  Alpha-D-Galactosidase (BEANO PO), Take 2 tablets by mouth 3 (three) times daily with meals., Disp: , Rfl:    Alpha-Lipoic Acid 600 MG TABS, Take 600 mg by mouth daily., Disp: , Rfl:    aspirin EC 81 MG tablet, Take 81 mg by mouth daily. Swallow whole. Taking ASA while patient is off Plavix.  When patient starts back on Plavix, she will stop ASA per MD., Disp: , Rfl:    atorvastatin (LIPITOR) 80 MG tablet, Take 1 tablet (80 mg total) by mouth daily., Disp: 90 tablet, Rfl: 3   azelastine (ASTELIN) 0.1 % nasal spray, Place 2 sprays into both nostrils daily. Use in each nostril as directed (Patient taking differently: Place 2 sprays into both nostrils at bedtime as needed for rhinitis. Use in each nostril as directed), Disp: 30 mL, Rfl: 5   benzonatate (TESSALON) 200 MG capsule, TAKE 1  CAPSULE BY MOUTH THREE TIMES DAILY AS NEEDED FOR COUGH, Disp: 30 capsule, Rfl: 0   Calcium-Magnesium-Vitamin D (CALCIUM MAGNESIUM PO), Take 1 tablet by mouth daily., Disp: , Rfl:    cetirizine (ZYRTEC) 10 MG tablet, Take 1 tablet (10 mg total) by mouth 2 (two) times daily as needed for allergies (Can take a n extra dose during flare ups.). (Patient taking differently: Take 10 mg by mouth every evening.), Disp: 60 tablet, Rfl: 5   cholecalciferol (VITAMIN D) 25 MCG (1000 UNIT) tablet, Take 1,000 Units by mouth every evening., Disp: , Rfl:    clopidogrel (PLAVIX) 75 MG tablet, Take 1 tablet (75 mg total) by mouth daily., Disp: 90 tablet, Rfl: 3   cyproheptadine (PERIACTIN) 4 MG tablet, Take 2 tablets (8 mg total) by mouth at bedtime. Take 2 tablets at bedtime as needed to treat facial pain or sleep dysfunction. (Patient taking differently: Take 8 mg by mouth at bedtime.), Disp: 60 tablet, Rfl: 5   denosumab (PROLIA) 60 MG/ML SOSY injection, Inject 60 mg into the skin every 6 (six) months., Disp: , Rfl:    dexlansoprazole (DEXILANT) 60 MG capsule, Take 1 capsule (60 mg total) by mouth daily., Disp: 90 capsule, Rfl: 3   diclofenac Sodium (VOLTAREN) 1 % GEL, APPLY 2 GRAMS TOPICALLY 4 TIMES DAILY (Patient taking differently: Apply 1 Application topically 4 (four) times daily as needed (pain).), Disp: 100 g, Rfl: 1   empagliflozin (JARDIANCE) 10 MG TABS tablet, Take 1 tablet (10 mg total) by mouth daily before breakfast., Disp: 30 tablet, Rfl: 5   EPINEPHrine 0.3 mg/0.3 mL IJ SOAJ injection, USE AS DIRECTED BY YOUR PHYSICIAN INTRAMUSCULARLY AS NEEDED FOR ANAPHYLAXIS, Disp: 2 each, Rfl: 1   famotidine (PEPCID) 20 MG tablet, Take 1 tablet (20 mg total) by mouth at bedtime., Disp: 30 tablet, Rfl: 5   fluticasone (FLONASE) 50 MCG/ACT nasal spray, USE 2 SPRAYS IN EACH NOSTRIL DAILY (Patient taking differently: Place 2 sprays into both nostrils daily as needed for allergies.), Disp: 16 g, Rfl: 2   folic acid  (FOLVITE) 1 MG tablet, Take 1 tablet (1 mg total) by mouth daily., Disp: 90 tablet, Rfl: 3   furosemide (LASIX) 40 MG tablet, TAKE 1 TABLET BY MOUTH TWICE DAILY FOR 3 DAYS, THEN BACK TO ONCE DAILY (Patient taking differently: Take 40 mg by mouth daily.), Disp: 40 tablet, Rfl: 1   gabapentin (NEURONTIN) 100 MG capsule, Take 1 capsule (100 mg total) by mouth 3 (three) times daily., Disp: 90 capsule, Rfl: 3   Glucose Blood (BLOOD GLUCOSE TEST STRIPS) STRP, Please dispense per patient and insurance preference.  Use as directed to monitor FSBS 4x . Dx: E11.65, Disp: 150 strip, Rfl: 1   guaiFENesin (MUCINEX) 600 MG 12 hr tablet, Take 1 tablet (600 mg total) by mouth 2 (two) times daily as needed for cough or to loosen phlegm. (Patient taking differently: Take 600 mg by mouth 2 (two) times daily.), Disp: 60 tablet, Rfl: 1   HIZENTRA 1 GM/5ML SOLN, Hizentra 3 times/5 ml = 65 ml total once weekly on Fridays, Disp: , Rfl:    isosorbide-hydrALAZINE (BIDIL) 20-37.5 MG tablet, Take 1 tablet by mouth every evening., Disp: , Rfl:    Lactase (LACTOSE FAST ACTING RELIEF PO), Take 3 tablets by mouth 3 (three) times daily., Disp: , Rfl:    lidocaine-prilocaine (EMLA) cream, Apply 1 application  topically as needed (pain)., Disp: , Rfl:    LORazepam (ATIVAN) 0.5 MG tablet, Take 1 tablet (0.5 mg total) by mouth 3 times/day as needed-between meals & bedtime for sleep. (Patient not taking: Reported on 08/17/2021), Disp: 30 tablet, Rfl: 1   magnesium oxide (MAG-OX) 400 (240 Mg) MG tablet, Take 400 mg by mouth daily., Disp: , Rfl:    Menthol, Topical Analgesic, (BIOFREEZE) 4 % GEL, Apply 1 application. topically daily as needed (pain)., Disp: , Rfl:    mirtazapine (REMERON SOL-TAB) 30 MG disintegrating tablet, Take 30 mg by mouth at bedtime as needed (sleep)., Disp: , Rfl:    montelukast (SINGULAIR) 10 MG tablet, Take 1 tablet (10 mg total) by mouth daily. (Patient taking differently: Take 10 mg by mouth every evening.), Disp: 30  tablet, Rfl: 5   NEOMYCIN-POLYMYXIN-HYDROCORTISONE (CORTISPORIN) 1 % SOLN OTIC solution, INSTILL 3 DROPS INTO THE LEFT EAR 4 TIMES DAILY (Patient not taking: Reported on 08/17/2021), Disp: 10 mL, Rfl: 0   Nystatin (GERHARDT'S BUTT CREAM) CREA, Apply topically 3 times a day as needed for itching or irritation, Disp: 4 each, Rfl: 6   nystatin (MYCOSTATIN) 100000 UNIT/ML suspension, Use as directed 15 mLs in the mouth or throat at bedtime as needed (mouth sores)., Disp: , Rfl:    nystatin-triamcinolone ointment (MYCOLOG), Apply 1 Application topically 2 (two) times daily as needed (vaginal irritation)., Disp: , Rfl:    ondansetron (ZOFRAN) 4 MG tablet, Take 1 tablet (4 mg total) by mouth every 8 (eight) hours as needed for nausea or vomiting., Disp: 20 tablet, Rfl: 0   oxyCODONE-acetaminophen (PERCOCET) 5-325 MG tablet, Take 1 tablet by mouth every 4 (four) hours as needed for severe pain., Disp: 30 tablet, Rfl: 0   potassium chloride SA (KLOR-CON M) 20 MEQ tablet, Take 20 mEq by mouth daily., Disp: , Rfl:    predniSONE (DELTASONE) 1 MG tablet, TAKE TWO TABLET WITH BREAKFAST... TAKE WITH 10 MG FOR A TOTAL OF 12 MG DAILY (Patient taking differently: Take 2 mg by mouth. Total of 7 mg daily.), Disp: 60 tablet, Rfl: 0   predniSONE (DELTASONE) 5 MG tablet, Take 1 tablet (5 mg total) by mouth daily with breakfast. Along with 2 mg tablets for total daily dose 7 mg total daily, Disp: 90 tablet, Rfl: 0   predniSONE (DELTASONE) 5 MG tablet, Take 4 tablets (20 mg total) by mouth daily with breakfast for 3 days, THEN 3 tablets (15 mg total) daily with breakfast for 3 days, THEN 2 tablets (10 mg total) daily with breakfast for 3 days. Then resume maintenance dose.., Disp: 27 tablet, Rfl: 0   Prenatal Vit-Fe Fumarate-FA (PRENATAL VITAMINS PO), Take 1 tablet by mouth daily., Disp: , Rfl:    Probiotic  Product (ALIGN PO), Take 1 capsule by mouth daily., Disp: , Rfl:    Propylene Glycol (SYSTANE COMPLETE) 0.6 % SOLN, Place 1  drop into both eyes 2 (two) times daily., Disp: 15 mL, Rfl: 5   sulfamethoxazole-trimethoprim (BACTRIM DS) 800-160 MG tablet, Take 1 tablet by mouth 2 (two) times daily., Disp: 28 tablet, Rfl: 0   Tiotropium Bromide-Olodaterol (STIOLTO RESPIMAT) 2.5-2.5 MCG/ACT AERS, Inhale 2 puffs into the lungs daily. (Patient taking differently: Inhale 2 puffs into the lungs every evening.), Disp: 4 g, Rfl: 11   traMADol (ULTRAM) 50 MG tablet, TAKE 1 TABLET BY MOUTH DAILY AT BEDTIME, Disp: 30 tablet, Rfl: 2   valsartan (DIOVAN) 80 MG tablet, Take 1 tablet (80 mg total) by mouth daily., Disp: 90 tablet, Rfl: 3   XARELTO 20 MG TABS tablet, TAKE 1 TABLET BY MOUTH DAILY WITH SUPPER, Disp: 90 tablet, Rfl: 3 No current facility-administered medications for this visit.  Facility-Administered Medications Ordered in Other Visits:    iohexol (OMNIPAQUE) 350 MG/ML injection, , , PRN, Leonie Man, MD, 105 mL at 01/11/21 2952  Social History   Tobacco Use  Smoking Status Never   Passive exposure: Yes  Smokeless Tobacco Never  Tobacco Comments   Parents    Allergies  Allergen Reactions   Baclofen Other (See Comments)    Altered mental status requiring a 3-day hospital stay- patient became unresponsive   Dust Mite Extract Shortness Of Breath and Other (See Comments)    "sneezing" (02/20/2012)   Molds & Smuts Shortness Of Breath   Morphine And Related Hives and Itching   Other Shortness Of Breath and Other (See Comments)    Grass and weeds "sneezing; filled sinuses" (02/20/2012)   Penicillins Rash and Other (See Comments)    "welts" (02/20/2012) Tolerated Ancef 02/16/20.    Rofecoxib Swelling and Other (See Comments)    Vioxx- feet swelling    Shellfish Allergy Anaphylaxis, Shortness Of Breath, Itching and Rash   Tetracycline Hcl Nausea And Vomiting   Xolair [Omalizumab] Other (See Comments)    Caused Blood clot   Zoledronic Acid Other (See Comments)    Fever, Put in hospital, dr said it was a reaction  from a reaction  Other reaction(s): Unknown   Dilaudid [Hydromorphone Hcl] Itching   Levofloxacin Other (See Comments)    GI upset   Oxycodone Hcl Nausea And Vomiting   Paroxetine Nausea And Vomiting and Other (See Comments)    Paxil    Celecoxib Swelling and Other (See Comments)    Feet swelling   Diltiazem Hcl Swelling   Hydrocodone-Acetaminophen Itching   Lactose Intolerance (Gi) Other (See Comments)    Bloating and gas   Rituximab     Anemia    Tree Extract Other (See Comments)    "tested and told I was allergic to it; never experienced a reaction to it" (02/20/2012)   Gamunex [Immune Globulin] Itching and Rash    Tolerates hizentra    Penicillin G Procaine Rash and Other (See Comments)    Welts, also   Objective:  There were no vitals filed for this visit. There is no height or weight on file to calculate BMI. Constitutional Well developed. Well nourished.  Vascular Foot warm and well perfused. Capillary refill normal to all digits.   Neurologic Normal speech. Oriented to person, place, and time. Epicritic sensation to light touch grossly present bilaterally.  Dermatologic Skin healing well without signs of infection. Skin edges well coapted without signs of infection.  Orthopedic: Tenderness to palpation noted about the surgical site.   Radiographs: 3 views of skeletally mature adult right foot: Pin is intact no signs of backing out or loosening noted.  Good correction alignment noted. Assessment:   1. Status post foot surgery   2. Hammertoe of second toe of right foot   3. Joint contracture of foot, right   4. Mass of soft tissue of left upper extremity    Plan:  Patient was evaluated and treated and all questions answered.  S/p foot surgery bilaterally -Progressing as expected post-operatively. -XR: See above -WB Status: Weightbearing as tolerated in surgical shoe bilaterally -Sutures: Intact.  No clinical signs of Deis is noted.  No complication  noted. -Medications: None -Foot redressed.  No follow-ups on file.

## 2021-09-12 ENCOUNTER — Ambulatory Visit (HOSPITAL_BASED_OUTPATIENT_CLINIC_OR_DEPARTMENT_OTHER): Payer: Medicare PPO | Admitting: Cardiology

## 2021-09-12 ENCOUNTER — Encounter (HOSPITAL_BASED_OUTPATIENT_CLINIC_OR_DEPARTMENT_OTHER): Payer: Self-pay | Admitting: Cardiology

## 2021-09-12 VITALS — BP 108/52 | HR 73 | Ht 60.0 in | Wt 137.1 lb

## 2021-09-12 DIAGNOSIS — E782 Mixed hyperlipidemia: Secondary | ICD-10-CM

## 2021-09-12 DIAGNOSIS — I5042 Chronic combined systolic (congestive) and diastolic (congestive) heart failure: Secondary | ICD-10-CM | POA: Diagnosis not present

## 2021-09-12 DIAGNOSIS — I429 Cardiomyopathy, unspecified: Secondary | ICD-10-CM

## 2021-09-12 DIAGNOSIS — Z955 Presence of coronary angioplasty implant and graft: Secondary | ICD-10-CM

## 2021-09-12 DIAGNOSIS — I2510 Atherosclerotic heart disease of native coronary artery without angina pectoris: Secondary | ICD-10-CM

## 2021-09-12 NOTE — Patient Instructions (Addendum)
Medication Instructions:  For now, you can cut the valsartan in half and take 40 mg daily. If you notice that your blood pressure is averaging more than 140 on the top, I would go back to the full 80 mg dose.  *If you need a refill on your cardiac medications before your next appointment, please call your pharmacy*   Lab Work: None ordered today   Testing/Procedures: None ordered today   Follow-Up: At [facility], you and your health needs are our priority.  As part of our continuing mission to provide you with exceptional heart care, we have created designated Provider Care Teams.  These Care Teams include your primary Cardiologist (physician) and Advanced Practice Providers (APPs -  Physician Assistants and Nurse Practitioners) who all work together to provide you with the care you need, when you need it.  We recommend signing up for the patient portal called "MyChart".  Sign up information is provided on this After Visit Summary.  MyChart is used to connect with patients for Virtual Visits (Telemedicine).  Patients are able to view lab/test results, encounter notes, upcoming appointments, etc.  Non-urgent messages can be sent to your provider as well.   To learn more about what you can do with MyChart, go to [email or web address].    Your next appointment:   2 month(s)  The format for your next appointment:   In Person  Provider:   Buford Dresser, MD{

## 2021-09-12 NOTE — Progress Notes (Signed)
Cardiology Office Note:    Date:  09/12/2021   ID:  Ebony Scott, DOB 1951/01/13, MRN [id number]  PCP:  [name], MD  Cardiologist:  Buford Dresser, MD PhD  Referring MD: [name], MD   CC: follow up  History of Present Illness:    Ebony Scott is a 71 y.o. 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 Initial consult was 10/15/17 for incidental coronary artery calcification seen on CT scan.  Cardiac history: had CT chest 07/2017 which noted diffuse calcification, including aorta, aortic branch vessels, and coronary arteries. Had a normal Lexiscan in the past. No chest pain, main longstanding symptom is shortness of breath, followed by pulmonary. No history of clinical ASCVD events. History of DVT/PE. New diagnose of cardiomyopathy 08/2020.  At her last appointment she was struggling with worsening shortness of breath, as well as a productive cough with yellow sputum. Her home blood pressure and weight were stable. She took Lasix every morning and noticed improvement with LE swelling. She was scheduled to undergo bilateral foot surgery. A ganglion cyst and callous would be removed from her R foot. A pin would be placed in her L foot where she previously broke a toe.   On 08/28/2021 she underwent Hammer Toe repair with capsulotomy for 2nd toe on right foot and Excision of soft tissue mass on the left foot.    Today: She is accompanied by a family member. She states her surgery went well, but she is not feeling well. Her knee pain has been worse than her in her feet. Usually she is sedentary for most of the day as she recovers.  Since her last visit her breathing has been stable and unchanged.  Lately she has not been feeling hungry. She does not have much of an  appetite. Frequently she will skip lunches, but may eat a large breakfast. She has been drinking a lot of apple juice.   In the mornings she moves slowly to avoid lightheadedness. She continues to take Lasix in the morning. Her LE swelling is still present at this time, L>R.  She presents a blood pressure log today, personally reviewed. This shows a majority of normal to high readings, such as 546 systolic and 568/12. Her BP has been as low as 92/54, and very rarely in the 150's.  Additionally she is not sleeping well. Typically she may go to bed 2-3 AM, gets up around 7 AM. She stopped taking her sleep medication due to feeling groggy.   Sometimes she feels a chill run down her back while sitting and watching TV. This is always localized in the same area.  She denies any palpitations, chest pain, or shortness of breath. No headaches, syncope, orthopnea, or PND. No hematuria or hematochezia.   Past Medical History:  Diagnosis Date   Abscess of dorsum of right hand 09/22/2020   Acute deep vein thrombosis (DVT) of right lower extremity ([place]) 12/13/2017   Allergic rhinitis    Anemia    Angio-edema    Anxiety    pt denies dx   Arthritis    Phreesia 10/21/2019   Asthma    Phreesia 10/21/2019   Breast cancer ([place]) 1998   Left breast, in remission   Cataract    REMOVED   CHF (congestive heart failure) ([place])  Clostridium difficile colitis 42/68/3419   Complication of anesthesia    "had hard time waking up from it several times" (02/20/2012), no anesthesia problems since 2014   Coronary artery disease    Depression    "some; don't take anything for it" (02/20/2012), pt denies dx as of 06/02/21   Diverticulosis    DVT (deep venous thrombosis) ([place])    Dyspnea    all the time   Fibromyalgia 11/2011   GERD (gastroesophageal reflux disease)    Graves disease    Headache(784.0)    "related to allergies; more at different times during the year" (02/20/2012)   Hemorrhoids    Hiatal hernia     back and neck   History of blood transfusion 2023   1 unit, 5 units of Iron   Hx of adenomatous colonic polyps 04/12/2016   Hypercholesteremia    good cholesterol is high   Hypothyroidism    IBS (irritable bowel syndrome)    Moderate persistent asthma    -FeV1 72% 2011, -IgE 102 2011, CT sinus Neg 2011   Osteomyelitis of second toe of right foot ([place]) 09/23/2020   Osteoporosis    on reclast yearly   Peripheral vascular disease ([place]) 2019   DVTs (3) lungs and leg   Pneumonia 04/2011; ~ 11/2011   "double; single" (02/20/2012)   Septic olecranon bursitis of right elbow 09/22/2020   Seronegative rheumatoid arthritis ([place])    Dr. Lahoma Rocker   SIRS (systemic inflammatory response syndrome) ([place]) 02/10/2018   Sleep apnea    uses cpap nightly   Tracheobronchomalacia     Past Surgical History:  Procedure Laterality Date   ABDOMINAL HYSTERECTOMY N/A    Phreesia 10/21/2019   ANTERIOR AND POSTERIOR REPAIR  1990's   APPENDECTOMY     ARGON LASER APPLICATION  62/22/9798   Procedure: ARGON LASER APPLICATION;  Surgeon: Yetta Flock, MD;  Location: [facility];  Service: Gastroenterology;;   BREAST LUMPECTOMY  1998   left   BREAST SURGERY N/A    Phreesia 10/21/2019   BRONCHIAL WASHINGS  04/05/2020   Procedure: BRONCHIAL WASHINGS;  Surgeon: Freddi Starr, MD;  Location: [facility];  Service: Pulmonary;;   CAPSULOTOMY Right 08/28/2021   Procedure: CAPSULOTOMY;  Surgeon: Felipa Furnace, DPM;  Location: [facility];  Service: Podiatry;  Laterality: Right;   CARPOMETACARPEL ([place]) FUSION OF THUMB WITH AUTOGRAFT FROM RADIUS  ~ 2009   "both thumbs" (02/20/2012)   CATARACT EXTRACTION W/ INTRAOCULAR LENS  IMPLANT, BILATERAL  2012   CERVICAL DISCECTOMY  10/2001   C5-C6   CERVICAL FUSION  2003   C3-C4   CHOLECYSTECTOMY     COLONOSCOPY     CORONARY STENT INTERVENTION N/A 01/11/2021   Procedure: CORONARY STENT INTERVENTION;  Surgeon: Leonie Man, MD;  Location: [facility];   Service: Cardiovascular;  Laterality: N/A;   DEBRIDEMENT TENNIS ELBOW  ?1970's   right   ESOPHAGOGASTRODUODENOSCOPY     ESOPHAGOGASTRODUODENOSCOPY (EGD) WITH PROPOFOL N/A 02/05/2021   Procedure: ESOPHAGOGASTRODUODENOSCOPY (EGD) WITH PROPOFOL;  Surgeon: Yetta Flock, MD;  [facility];  Service: Gastroenterology;  Laterality: N/A;   EYE SURGERY N/A    Phreesia 10/21/2019   HAMMER TOE SURGERY Right 08/28/2021   Procedure: HAMMER TOE REPAIR 2ND TOE RIGHT FOOT;  Surgeon: Felipa Furnace, DPM;  Location: [name];  Service: Podiatry;  Laterality: Right;   HYSTERECTOMY     I & D EXTREMITY Right 09/22/2020   Procedure: IRRIGATION AND DEBRIDEMENT RIGHT HAND  AND ELBOW;  Surgeon: Marchia Bond, MD;  [facility]: [facility];  Service: Orthopedics;  Laterality: Right;   KNEE ARTHROPLASTY  ?1990's   "?right; w/cartilage repair" (02/20/2012)   MASS EXCISION Left 08/28/2021   Procedure: EXCISION OF SOFT TISSUE  MASS LEFT FOOT;  Surgeon: Felipa Furnace, DPM;  Location: [facility];  Service: Podiatry;  Laterality: Left;   NASAL SEPTUM SURGERY  1980's   POSTERIOR CERVICAL FUSION/FORAMINOTOMY  2004   "failed initial fusion; rewired  anterior neck" (02/20/2012)   REVERSE SHOULDER ARTHROPLASTY Right 02/16/2020   Procedure: REVERSE SHOULDER ARTHROPLASTY;  Surgeon: Marchia Bond, MD;  [facility];  Service: Orthopedics;  Laterality: Right;   RIGHT/LEFT HEART CATH AND CORONARY ANGIOGRAPHY N/A 01/11/2021   Procedure: RIGHT/LEFT HEART CATH AND CORONARY ANGIOGRAPHY;  Surgeon: Leonie Man, MD;  Location: [facility];  Service: Cardiovascular;  Laterality: N/A;   SPINE SURGERY N/A    Phreesia 10/21/2019   TONSILLECTOMY  ~ 1953   VESICOVAGINAL FISTULA CLOSURE W/ TAH  1988   VIDEO BRONCHOSCOPY Bilateral 08/23/2016   Procedure: VIDEO BRONCHOSCOPY WITH FLUORO;  Surgeon: Javier Glazier, MD;  [facility];  Service: Cardiopulmonary;  Laterality: Bilateral;   VIDEO BRONCHOSCOPY N/A 04/05/2020    Procedure: VIDEO BRONCHOSCOPY WITHOUT FLUORO;  Surgeon: Freddi Starr, MD;  [facility];  Service: Pulmonary;  Laterality: N/A;    Current Medications: Current Outpatient Medications on File Prior to Visit  Medication Sig   acetaminophen (TYLENOL) 650 MG CR tablet Take 1,300 mg by mouth every 8 (eight) hours as needed for pain.   albuterol (VENTOLIN HFA) 108 (90 Base) MCG/ACT inhaler Inhale 2 puffs into the lungs every 6 (six) hours as needed for wheezing or shortness of breath.   allopurinol (ZYLOPRIM) 100 MG tablet Take 1 tablet (100 mg total) by mouth daily.   Alpha-D-Galactosidase (BEANO PO) Take 2 tablets by mouth 3 (three) times daily with meals.   Alpha-Lipoic Acid 600 MG TABS Take 600 mg by mouth daily.   aspirin EC 81 MG tablet Take 81 mg by mouth daily. Swallow whole. Taking ASA while patient is off Plavix.  When patient starts back on Plavix, she will stop ASA per MD.   atorvastatin (LIPITOR) 80 MG tablet Take 1 tablet (80 mg total) by mouth daily.   azelastine (ASTELIN) 0.1 % nasal spray Place 2 sprays into both nostrils daily. Use in each nostril as directed (Patient taking differently: Place 2 sprays into both nostrils at bedtime as needed for rhinitis. Use in each nostril as directed)   benzonatate (TESSALON) 200 MG capsule TAKE 1 CAPSULE BY MOUTH THREE TIMES DAILY AS NEEDED FOR COUGH   Calcium-Magnesium-Vitamin D (CALCIUM MAGNESIUM PO) Take 1 tablet by mouth daily.   cetirizine (ZYRTEC) 10 MG tablet Take 1 tablet (10 mg total) by mouth 2 (two) times daily as needed for allergies (Can take a n extra dose during flare ups.). (Patient taking differently: Take 10 mg by mouth every evening.)   cholecalciferol (VITAMIN D) 25 MCG (1000 UNIT) tablet Take 1,000 Units by mouth every evening.   clopidogrel (PLAVIX) 75 MG tablet Take 1 tablet (75 mg total) by mouth daily.   cyproheptadine (PERIACTIN) 4 MG tablet Take 2 tablets (8 mg total) by mouth at bedtime. Take 2 tablets  at bedtime as needed to treat facial pain or sleep dysfunction. (Patient taking differently: Take 8 mg by mouth at bedtime.)   denosumab (PROLIA) 60 MG/ML SOSY injection Inject 60 mg into the  skin every 6 (six) months.   dexlansoprazole (DEXILANT) 60 MG capsule Take 1 capsule (60 mg total) by mouth daily.   diclofenac Sodium (VOLTAREN) 1 % GEL APPLY 2 GRAMS TOPICALLY 4 TIMES DAILY (Patient taking differently: Apply 1 Application topically 4 (four) times daily as needed (pain).)   empagliflozin (JARDIANCE) 10 MG TABS tablet Take 1 tablet (10 mg total) by mouth daily before breakfast.   EPINEPHrine 0.3 mg/0.3 mL IJ SOAJ injection USE AS DIRECTED BY YOUR PHYSICIAN INTRAMUSCULARLY AS NEEDED FOR ANAPHYLAXIS   famotidine (PEPCID) 20 MG tablet Take 1 tablet (20 mg total) by mouth at bedtime.   fluticasone (FLONASE) 50 MCG/ACT nasal spray USE 2 SPRAYS IN EACH NOSTRIL DAILY (Patient taking differently: Place 2 sprays into both nostrils daily as needed for allergies.)   folic acid (FOLVITE) 1 MG tablet Take 1 tablet (1 mg total) by mouth daily.   furosemide (LASIX) 40 MG tablet TAKE 1 TABLET BY MOUTH TWICE DAILY FOR 3 DAYS, THEN BACK TO ONCE DAILY (Patient taking differently: Take 40 mg by mouth daily.)   gabapentin (NEURONTIN) 100 MG capsule Take 1 capsule (100 mg total) by mouth 3 (three) times daily.   guaiFENesin (MUCINEX) 600 MG 12 hr tablet Take 1 tablet (600 mg total) by mouth 2 (two) times daily as needed for cough or to loosen phlegm. (Patient taking differently: Take 600 mg by mouth 2 (two) times daily.)   HIZENTRA 1 GM/5ML SOLN Hizentra 3 times/5 ml = 65 ml total once weekly on Fridays   isosorbide-hydrALAZINE (BIDIL) 20-37.5 MG tablet Take 1 tablet by mouth every evening.   Lactase (LACTOSE FAST ACTING RELIEF PO) Take 3 tablets by mouth 3 (three) times daily.   lidocaine-prilocaine (EMLA) cream Apply 1 application  topically as needed (pain).   magnesium oxide (MAG-OX) 400 (240 Mg) MG tablet Take  400 mg by mouth daily.   Menthol, Topical Analgesic, (BIOFREEZE) 4 % GEL Apply 1 application. topically daily as needed (pain).   mirtazapine (REMERON SOL-TAB) 30 MG disintegrating tablet Take 30 mg by mouth at bedtime as needed (sleep).   montelukast (SINGULAIR) 10 MG tablet Take 1 tablet (10 mg total) by mouth daily. (Patient taking differently: Take 10 mg by mouth every evening.)   Nystatin (GERHARDT'S BUTT CREAM) CREA Apply topically 3 times a day as needed for itching or irritation   nystatin (MYCOSTATIN) 100000 UNIT/ML suspension Use as directed 15 mLs in the mouth or throat at bedtime as needed (mouth sores).   nystatin-triamcinolone ointment (MYCOLOG) Apply 1 Application topically 2 (two) times daily as needed (vaginal irritation).   potassium chloride SA (KLOR-CON M) 20 MEQ tablet Take 20 mEq by mouth daily.   predniSONE (DELTASONE) 1 MG tablet TAKE TWO TABLET WITH BREAKFAST... TAKE WITH 10 MG FOR A TOTAL OF 12 MG DAILY (Patient taking differently: Take 2 mg by mouth. Total of 7 mg daily.)   predniSONE (DELTASONE) 5 MG tablet Take 1 tablet (5 mg total) by mouth daily with breakfast. Along with 2 mg tablets for total daily dose 7 mg total daily   Prenatal Vit-Fe Fumarate-FA (PRENATAL VITAMINS PO) Take 1 tablet by mouth daily.   Probiotic Product (ALIGN PO) Take 1 capsule by mouth daily.   Propylene Glycol (SYSTANE COMPLETE) 0.6 % SOLN Place 1 drop into both eyes 2 (two) times daily.   Tiotropium Bromide-Olodaterol (STIOLTO RESPIMAT) 2.5-2.5 MCG/ACT AERS Inhale 2 puffs into the lungs daily. (Patient taking differently: Inhale 2 puffs into the lungs every evening.)  traMADol (ULTRAM) 50 MG tablet TAKE 1 TABLET BY MOUTH DAILY AT BEDTIME   valsartan (DIOVAN) 80 MG tablet Take 1 tablet (80 mg total) by mouth daily.   XARELTO 20 MG TABS tablet TAKE 1 TABLET BY MOUTH DAILY WITH SUPPER   Current Facility-Administered Medications on File Prior to Visit  Medication   iohexol (OMNIPAQUE) 350 MG/ML  injection     Allergies:   Baclofen, Dust mite extract, Molds & smuts, Morphine and related, Other, Penicillins, Rofecoxib, Shellfish allergy, Tetracycline hcl, Xolair [omalizumab], Zoledronic acid, Dilaudid [hydromorphone hcl], Levofloxacin, Oxycodone hcl, Paroxetine, Celecoxib, Diltiazem hcl, Hydrocodone-acetaminophen, Lactose intolerance (gi), Rituximab, Tree extract, Gamunex [immune globulin], and Penicillin g procaine   Social History   Tobacco Use   Smoking status: Never    Passive exposure: Yes   Smokeless tobacco: Never   Tobacco comments:    Parents  Vaping Use   Vaping Use: Never used  Substance Use Topics   Alcohol use: Not Currently    Alcohol/week: 0.0 standard drinks of alcohol   Drug use: No    Family History: The patient's family history includes Allergies in her father and mother; Arthritis in her father and mother; Colitis in her daughter; Colon cancer in an other family member; Diabetes in her maternal grandfather and mother; Heart disease in her brother, father, and mother; Lung cancer in her mother; Stroke in her father. Father had 4V CABG and valve replacement at age 88; had a complication with ruptured aorta after surgery and passed away. Mother had lung cancer and diabetes. No other significant heart disease or risks.   ROS:   Please see the history of present illness.   (+) Loss of appetite (+) Bilateral LE edema (+) Insomnia (+) Knee pain Additional pertinent ROS otherwise unremarkable.  EKGs/Labs/Other Studies Reviewed:    The following studies were reviewed today:  Echo 05/25/21  1. Septal and apical akinesis distal anterior wall and distal inferior  wall hypokinesis EF similar to TTE done 09/16/20. Left ventricular ejection  fraction, by estimation, is 30 to 35%. The left ventricle has moderately  decreased function. The left  ventricle has no regional wall motion abnormalities. The left ventricular  internal cavity size was moderately dilated. Left  ventricular diastolic  parameters are consistent with Grade I diastolic dysfunction (impaired  relaxation). Elevated left  ventricular end-diastolic pressure.   2. Right ventricular systolic function is normal. The right ventricular  size is normal.   3. The mitral valve is normal in structure. Mild mitral valve  regurgitation. No evidence of mitral stenosis.   4. Calcified non coronary cusp . The aortic valve is tricuspid. There is  mild calcification of the aortic valve. Aortic valve regurgitation is not  visualized. Aortic valve sclerosis is present, with no evidence of aortic  valve stenosis.   5. The inferior vena cava is normal in size with greater than 50%  respiratory variability, suggesting right atrial pressure of 3 mmHg.   R/L Heart Cath 01/11/21   Prox LAD lesion is 95% stenosed proximal to 1st Diag &  Mid LAD lesion is 90% stenosed following   Mid LAD lesion is 90% stenosed.   A drug-eluting stent was successfully placed covering both lesions using a STENT ONYX FRONTIER 2.25X34.   Post intervention, there is a 0% residual stenosis.   --------------------------------------------------------------   Hemodynamic findings consistent with moderate pulmonary hypertension.   There is no aortic valve stenosis.   SUMMARY Severe single-vessel CAD with 95% and 90% stenoses of the  proximal and mid LAD at major 1st Diag branch.  Likely be culprit for reduced EF and anterior hypokinesis on echo. Successful DES PCI of the LAD using an Onyx Frontier DES 2.25 mm x 34 mm postdilated in tapered fashion from 2.6 to 2.35m.   TIMI III flow pre and post, reduced to 0% Otherwise minimal CAD in the Right dominant system. 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 Moderate to severely reduced cardiac function with Cardiac Output-Index of 4.05-2.59 by Fick and  3.75-2.40 by Thermal Dilution.   RECOMMENDATIONS Continue to titrate medical management of chronic Systolic and diastolic heart failure.  She was given 40 mg IV Lasix on the Cath Lab table.  Recommend that she takes 40 mg twice daily oral Lasix for 3 days postprocedure.   She will need close follow-up with either primary cardiologist or APP to initiate titration of medications. Clinically stable for d/c today  Echo 09/16/20 1. Septal apical and anterior wall hypokinesis EF decreased compard to TTE done 07/10/19 . Left ventricular ejection fraction, by estimation, is 35 to 40%. The left ventricle has moderately decreased function. The left ventricle demonstrates regional wall motion abnormalities (see scoring diagram/findings for description). The left ventricular internal cavity size was moderately dilated. Left ventricular diastolic parameters are consistent with Grade II diastolic dysfunction (pseudonormalization). Elevated left ventricular end-diastolic pressure.   2. Right ventricular systolic function is normal. The right ventricular size is normal.   3. Left atrial size was mildly dilated.   4. The mitral valve is abnormal. Mild mitral valve regurgitation. No evidence of mitral stenosis.   5. The aortic valve is tricuspid. There is moderate calcification of the aortic valve. Aortic valve regurgitation is not visualized. Mild to moderate aortic valve sclerosis/calcification is present, without any evidence of aortic stenosis.   6. The inferior vena cava is normal in size with greater than 50% respiratory variability, suggesting right atrial pressure of 3 mmHg.   FINDINGS   Left Ventricle: Septal apical and anterior wall hypokinesis EF decreased compard to TTE done 07/10/19.  CT Chest 08/23/2020: COMPARISON:  05/25/2020   FINDINGS: Cardiovascular: Aortic atherosclerosis. Tortuous thoracic aorta. Mild cardiomegaly. Three vessel coronary artery calcification. Aortic valve calcification.   IMPRESSION: 1. New trace right pleural fluid and left pleural fluid or thickening. Concurrent new mild pulmonary interlobular septal thickening. Constellation of findings suggests mild pulmonary edema/fluid overload. 2. No other explanation for patient's acute symptoms. 3. Decrease and resolution of pulmonary nodules, currently with morphology favoring scarring. 4. Coronary artery atherosclerosis. Aortic Atherosclerosis (ICD10-I70.0). 5. Aortic valvular calcifications. Consider echocardiography to evaluate for valvular dysfunction.  Echo 07/10/19 1. Hyperdynamic LV function; grade 1 diastolic dysfunction; moderate LVH;  trace AI.   2. Left ventricular ejection fraction, by estimation, is >75%. The left  ventricle has hyperdynamic function. The left ventricle has no regional  wall motion abnormalities. There is moderate left ventricular hypertrophy.  Left ventricular diastolic  parameters are consistent with Grade I diastolic dysfunction (impaired  relaxation).   3. Right ventricular systolic function is normal. The right ventricular  size is normal.   4. The mitral valve is normal in structure. No evidence of mitral valve  regurgitation. No evidence of mitral stenosis.   5. The aortic valve has an indeterminant number of cusps. Aortic valve  regurgitation is trivial. No aortic stenosis is present.  Echo 08/05/17 Study Conclusions   -  Left ventricle: The cavity size was normal. There was mild focal   basal hypertrophy of the septum. Systolic function was vigorous.   The estimated ejection fraction was in the range of 65% to 70%.   Wall motion was normal; there were no regional wall motion   abnormalities. Doppler parameters are consistent with abnormal   left ventricular relaxation (grade 1 diastolic dysfunction).   Impressions:   - Definity used; vigorous LV systolic function; mild diastolic   dysfunction.  CT chest high resolution 6.17.19 FINDINGS: Cardiovascular: Heart  size is normal. There is no significant pericardial fluid, thickening or pericardial calcification. There is aortic atherosclerosis, as well as atherosclerosis of the great vessels of the mediastinum and the coronary arteries, including calcified atherosclerotic plaque in the left main, left anterior descending left circumflex coronary arteries.  IMPRESSION: 1. No findings to suggest interstitial lung disease. 2. Mild air trapping indicative of mild small airways disease. 3. Aortic atherosclerosis, in addition to left main and 2 vessel coronary artery disease. Please note that although the presence of coronary artery calcium documents the presence of coronary artery disease, the severity of this disease and any potential stenosis cannot be assessed on this non-gated CT examination. Assessment for potential risk factor modification, dietary therapy or pharmacologic therapy may be warranted, if clinically indicated. 4. Additional incidental findings, as above.  Stress 2011:  Stress echo 11/2009 Study Conclusions - Stress ECG conclusions: There were no stress arrhythmias or conduction abnormalities. The stress ECG was normal. - Staged echo: There was no echocardiographic evidence for stress-induced ischemia. Impressions: - Nondiagnostic stress echocardiogram with nochest pain, no ST changes; patient did not achieve target heart rate; no   stress-induced wall motion abnormalities.  Had [place] 12/2009 due to nondiagnostic stress echo: Stress Procedure  The patient received IV Lexiscan 0.4 mg over 15-seconds with concurrent low level exercise and then Technetium 61mTetrofosmin was injected at 30-seconds while the patient continued walking one more minute.  There were no significant changes with Lexiscan.  Quantitative spect images were obtained after a 45 minute delay.   QPS Raw Data Images:  Normal; no motion artifact; normal heart/lung ratio. Stress Images:  Normal homogeneous uptake  in all areas of the myocardium. Rest Images:  Normal homogeneous uptake in all areas of the myocardium. Subtraction (SDS):  No evidence of ischemia. Transient Ischemic Dilatation:  1.15  (Normal <1.22)  Lung/Heart Ratio:  .27  (Normal <0.45)   Quantitative Gated Spect Images QGS EDV:  52 ml QGS ESV:  13 ml QGS EF:  75 % QGS cine images:  Normal motion   Findings Normal nuclear study  EKG:  EKG is personally reviewed. 09/12/2021:  EKG was not ordered. 06/08/21: NSR at 67 bpm, moderate LVH criteria, PRWP 01/06/2021: NSR at 71 bpm, moderate LVH criteria, PRWP 10/26/20: NSR at 80 bpm, PRWP, borderline LVH 02/23/20: sinus tachycardia a 106 bpm, lack of anterior forces V1/V2  Recent Labs: 10/04/2020: Magnesium 3.1 01/19/2021: BNP 621.2 05/24/2021: TSH 1.161 08/14/2021: ALT 73; BUN 38; Creat 1.37; Hemoglobin 10.7; Platelets 164; Potassium 4.4; Sodium 141   Recent Lipid Panel    Component Value Date/Time   CHOL 162 12/21/2020 0826   TRIG 210 (H) 12/21/2020 0826   HDL 64 12/21/2020 0826   CHOLHDL 2.5 12/21/2020 0826   CHOLHDL 8.4 09/23/2020 1253   VLDL UNABLE TO CALCULATE IF TRIGLYCERIDE OVER 400 mg/dL 09/23/2020 1253   LDLCALC 64 12/21/2020 0826   LDLCALC 77 07/04/2018 1017   LDLDIRECT 141.2 (H) 09/23/2020  1253    Physical Exam:    VS:  BP (!) 108/52 (BP Location: Right Arm, Patient Position: Sitting, Cuff Size: Normal)   Pulse 73   Ht 5' (1.524 m)   Wt 137 lb 1.6 oz (62.2 kg)   SpO2 93%   BMI 26.78 kg/m     Wt Readings from Last 3 Encounters:  09/12/21 137 lb 1.6 oz (62.2 kg)  08/28/21 130 lb (59 kg)  08/14/21 136 lb (61.7 kg)    GEN: Well nourished, well developed in no acute distress HEENT: Normal, moist mucous membranes NECK: No JVD CARDIAC: regular rhythm, normal S1 and S2, no rubs or gallops. No murmur. VASCULAR: Radial and DP pulses 2+ bilaterally. No carotid bruits RESPIRATORY:  Bronchial breath sounds with diffuse coarseness ABDOMEN: Soft, non-tender,  non-distended MUSCULOSKELETAL:  Ambulates independently with walker SKIN: Warm and dry, no edema NEUROLOGIC:  Alert and oriented x 3. No focal neuro deficits noted. PSYCHIATRIC:  Normal affect   ASSESSMENT:    1. Chronic combined systolic and diastolic heart failure ([place])   2. Coronary artery disease involving native coronary artery of native heart without angina pectoris   3. Mixed hyperlipidemia   4. Cardiomyopathy, unspecified type ([place])   5. S/P coronary artery stent placement      PLAN:    Unspecified cardiomyopathy Chronic systolic and diastolic heart failure -echo 06/2019 with hyperdynamic EF and only grade 1 diastolic dysfunction, then 08/2020 with reduced EF and focal wall motion abnormalities -mild LE edema today -counseled on heart failure education -current medications: furosemide 20 mg daily, empagliflozin 10 mg daily -changing valsartan 80 mg daily to 40 mg daily given lability of BP readings. If BP remains persistently elevated on 40 mg dose, would then go back to 80 mg valsartan dose -continue isordil-hydralazine, currently only able to take in the evening -was on bisoprolol 2.5 mg in the past, stopped 2/2 hypotension in 10/2020. Consider retrial in the future if BP/HR allow  CAD s/p stent 01/11/21 -asymptomatic -on clopidogrel, aspirin for 1 year -continue atorvastatin 80 mg daily  Mixed hyperlipidemia with goal LDL <70 -lipids from 12/21/20 reviewed. LDL 64, TG 210 -continue atorvastatin 80 mg  Secondary prevention: -recommend heart healthy/Mediterranean diet, with whole grains, fruits, vegetable, fish, lean meats, nuts, and olive oil. Limit salt. -recommend moderate walking, 3-5 times/week for 30-50 minutes each session. Aim for at least 150 minutes.week. Goal should be pace of 3 miles/hours, or walking 1.5 miles in 30 minutes -recommend avoidance of tobacco products. Avoid excess alcohol.  Plan for follow up: 2 months or sooner PRN  Buford Dresser,  MD, PhD, [facility]   Medication Adjustments/Labs and Tests Ordered: Current medicines are reviewed at length with the patient today.  Concerns regarding medicines are outlined above.   No orders of the defined types were placed in this encounter.  No orders of the defined types were placed in this encounter.  Patient Instructions  Medication Instructions:  For now, you can cut the valsartan in half and take 40 mg daily. If you notice that your blood pressure is averaging more than 140 on the top, I would go back to the full 80 mg dose.  *If you need a refill on your cardiac medications before your next appointment, please call your pharmacy*   Lab Work: None ordered today   Testing/Procedures: None ordered today   Follow-Up: At [facility], you and your health needs are our priority.  As part of our continuing  mission to provide you with exceptional heart care, we have created designated Provider Care Teams.  These Care Teams include your primary Cardiologist (physician) and Advanced Practice Providers (APPs -  Physician Assistants and Nurse Practitioners) who all work together to provide you with the care you need, when you need it.  We recommend signing up for the patient portal called "MyChart".  Sign up information is provided on this After Visit Summary.  MyChart is used to connect with patients for Virtual Visits (Telemedicine).  Patients are able to view lab/test results, encounter notes, upcoming appointments, etc.  Non-urgent messages can be sent to your provider as well.   To learn more about what you can do with MyChart, go to [email or web address].    Your next appointment:   2 month(s)  The format for your next appointment:   In Person  Provider:   Buford Dresser, MD{     I,Mathew Stumpf,acting as a scribe for Buford Dresser, MD.,have documented all relevant documentation on the behalf of Buford Dresser, MD,as  directed by  Buford Dresser, MD while in the presence of Buford Dresser, MD.  I, Buford Dresser, MD, have reviewed all documentation for this visit. The documentation on 09/12/21 for the exam, diagnosis, procedures, and orders are all accurate and complete.   Signed, Buford Dresser, MD PhD 09/12/2021   [place]

## 2021-09-19 ENCOUNTER — Other Ambulatory Visit: Payer: Self-pay

## 2021-09-19 ENCOUNTER — Inpatient Hospital Stay: Payer: Medicare PPO | Attending: Hematology and Oncology

## 2021-09-19 ENCOUNTER — Other Ambulatory Visit: Payer: Self-pay | Admitting: Hematology and Oncology

## 2021-09-19 ENCOUNTER — Inpatient Hospital Stay (HOSPITAL_BASED_OUTPATIENT_CLINIC_OR_DEPARTMENT_OTHER): Payer: Medicare PPO | Admitting: Hematology and Oncology

## 2021-09-19 ENCOUNTER — Inpatient Hospital Stay: Payer: Medicare PPO

## 2021-09-19 VITALS — BP 115/44 | HR 85 | Temp 98.0°F | Resp 15 | Ht 60.0 in | Wt 137.4 lb

## 2021-09-19 DIAGNOSIS — D509 Iron deficiency anemia, unspecified: Secondary | ICD-10-CM | POA: Insufficient documentation

## 2021-09-19 DIAGNOSIS — I2510 Atherosclerotic heart disease of native coronary artery without angina pectoris: Secondary | ICD-10-CM | POA: Diagnosis not present

## 2021-09-19 DIAGNOSIS — I509 Heart failure, unspecified: Secondary | ICD-10-CM | POA: Diagnosis not present

## 2021-09-19 DIAGNOSIS — Z86718 Personal history of other venous thrombosis and embolism: Secondary | ICD-10-CM | POA: Insufficient documentation

## 2021-09-19 DIAGNOSIS — M0600 Rheumatoid arthritis without rheumatoid factor, unspecified site: Secondary | ICD-10-CM | POA: Diagnosis not present

## 2021-09-19 DIAGNOSIS — D539 Nutritional anemia, unspecified: Secondary | ICD-10-CM

## 2021-09-19 DIAGNOSIS — K31819 Angiodysplasia of stomach and duodenum without bleeding: Secondary | ICD-10-CM | POA: Diagnosis not present

## 2021-09-19 DIAGNOSIS — Z7901 Long term (current) use of anticoagulants: Secondary | ICD-10-CM | POA: Diagnosis not present

## 2021-09-19 DIAGNOSIS — K219 Gastro-esophageal reflux disease without esophagitis: Secondary | ICD-10-CM | POA: Insufficient documentation

## 2021-09-19 DIAGNOSIS — K449 Diaphragmatic hernia without obstruction or gangrene: Secondary | ICD-10-CM | POA: Insufficient documentation

## 2021-09-19 DIAGNOSIS — R5383 Other fatigue: Secondary | ICD-10-CM | POA: Diagnosis not present

## 2021-09-19 DIAGNOSIS — D801 Nonfamilial hypogammaglobulinemia: Secondary | ICD-10-CM | POA: Insufficient documentation

## 2021-09-19 DIAGNOSIS — M797 Fibromyalgia: Secondary | ICD-10-CM | POA: Insufficient documentation

## 2021-09-19 DIAGNOSIS — E7800 Pure hypercholesterolemia, unspecified: Secondary | ICD-10-CM | POA: Insufficient documentation

## 2021-09-19 DIAGNOSIS — M810 Age-related osteoporosis without current pathological fracture: Secondary | ICD-10-CM | POA: Diagnosis not present

## 2021-09-19 DIAGNOSIS — Z86711 Personal history of pulmonary embolism: Secondary | ICD-10-CM | POA: Insufficient documentation

## 2021-09-19 DIAGNOSIS — Z79899 Other long term (current) drug therapy: Secondary | ICD-10-CM | POA: Diagnosis not present

## 2021-09-19 LAB — CBC WITH DIFFERENTIAL (CANCER CENTER ONLY)
Abs Immature Granulocytes: 0.06 10*3/uL (ref 0.00–0.07)
Basophils Absolute: 0.1 10*3/uL (ref 0.0–0.1)
Basophils Relative: 1 %
Eosinophils Absolute: 0.3 10*3/uL (ref 0.0–0.5)
Eosinophils Relative: 3 %
HCT: 28.8 % — ABNORMAL LOW (ref 36.0–46.0)
Hemoglobin: 9.1 g/dL — ABNORMAL LOW (ref 12.0–15.0)
Immature Granulocytes: 1 %
Lymphocytes Relative: 7 %
Lymphs Abs: 0.8 10*3/uL (ref 0.7–4.0)
MCH: 36.1 pg — ABNORMAL HIGH (ref 26.0–34.0)
MCHC: 31.6 g/dL (ref 30.0–36.0)
MCV: 114.3 fL — ABNORMAL HIGH (ref 80.0–100.0)
Monocytes Absolute: 1.3 10*3/uL — ABNORMAL HIGH (ref 0.1–1.0)
Monocytes Relative: 12 %
Neutro Abs: 8.6 10*3/uL — ABNORMAL HIGH (ref 1.7–7.7)
Neutrophils Relative %: 76 %
Platelet Count: 219 10*3/uL (ref 150–400)
RBC: 2.52 MIL/uL — ABNORMAL LOW (ref 3.87–5.11)
RDW: 17.2 % — ABNORMAL HIGH (ref 11.5–15.5)
WBC Count: 11 10*3/uL — ABNORMAL HIGH (ref 4.0–10.5)
nRBC: 0.2 % (ref 0.0–0.2)

## 2021-09-19 LAB — CMP (CANCER CENTER ONLY)
ALT: 38 U/L (ref 0–44)
AST: 40 U/L (ref 15–41)
Albumin: 3.8 g/dL (ref 3.5–5.0)
Alkaline Phosphatase: 49 U/L (ref 38–126)
Anion gap: 8 (ref 5–15)
BUN: 35 mg/dL — ABNORMAL HIGH (ref 8–23)
CO2: 26 mmol/L (ref 22–32)
Calcium: 8.8 mg/dL — ABNORMAL LOW (ref 8.9–10.3)
Chloride: 106 mmol/L (ref 98–111)
Creatinine: 1.72 mg/dL — ABNORMAL HIGH (ref 0.44–1.00)
GFR, Estimated: 31 mL/min — ABNORMAL LOW (ref >60)
Glucose, Bld: 177 mg/dL — ABNORMAL HIGH (ref 70–99)
Potassium: 4.4 mmol/L (ref 3.5–5.1)
Sodium: 140 mmol/L (ref 135–145)
Total Bilirubin: 0.5 mg/dL (ref 0.3–1.2)
Total Protein: 7.2 g/dL (ref 6.5–8.1)

## 2021-09-19 LAB — RETIC PANEL
Immature Retic Fract: 29.8 % — ABNORMAL HIGH (ref 2.3–15.9)
RBC.: 2.54 MIL/uL — ABNORMAL LOW (ref 3.87–5.11)
Retic Count, Absolute: 96.3 10*3/uL (ref 19.0–186.0)
Retic Ct Pct: 3.8 % — ABNORMAL HIGH (ref 0.4–3.1)
Reticulocyte Hemoglobin: 34.9 pg (ref >27.9)

## 2021-09-19 LAB — IRON AND IRON BINDING CAPACITY (CC-WL,HP ONLY)
Iron: 158 ug/dL (ref 28–170)
Saturation Ratios: 42 % — ABNORMAL HIGH (ref 10.4–31.8)
TIBC: 377 ug/dL (ref 250–450)
UIBC: 219 ug/dL (ref 148–442)

## 2021-09-19 LAB — VITAMIN B12: Vitamin B-12: 549 pg/mL (ref 180–914)

## 2021-09-19 LAB — FOLATE: Folate: >40 ng/mL (ref >5.9)

## 2021-09-19 LAB — FERRITIN: Ferritin: 36 ng/mL (ref 11–307)

## 2021-09-19 LAB — DIRECT ANTIGLOBULIN TEST (NOT AT ARMC)
DAT, IgG: NEGATIVE
DAT, complement: NEGATIVE

## 2021-09-19 LAB — LACTATE DEHYDROGENASE: LDH: 249 U/L — ABNORMAL HIGH (ref 98–192)

## 2021-09-19 NOTE — Progress Notes (Signed)
[place] [phone]   [phone]  PROGRESS NOTE  Patient Care Team: [name], MD as PCP - General (Family Medicine) Buford Dresser, MD as PCP - Cardiology (Cardiology) Buford Dresser, MD as Consulting Physician (Cardiology)  Hematological/Oncological History #Iron deficiency anemia: 1) 02/04/2021-02/07/2021: Admitted for iron deficiency anemia after presenting with Hgb of 6.5.  Patient underwent EGD 12/18 which revealed gastric antral vascular ectasia, treated with APC. Aspirin/plavix/xarelto held during hospitalization. Patient was given 1 unit of PRBC and IV venofer 200 mg x 1. Discussed with cardiology with recommendations to stop aspirin but patient should continue on Plavix. Xarelto was also resumed due to history of recurrent DVT/PE.   2) 02/24/2021: Hgb 8.3, MCV 98.2, Plt 404K, Iron 14, Iron Saturation 3.4%, Ferritin 74.6.  3) 03/14/2021: Establish care with [facility]  4) 03/15/2021-04/12/2021: Received IV venofer 200 mg once weekly x 5 doses.   5) 09/19/2021: Labs show white blood cell count 11.0, hemoglobin 9.1, MCV 114.3, and platelets of 219   HISTORY OF PRESENTING ILLNESS:  Ebony Scott 71 y.o. female returns for a follow up for iron deficiency anemia. She is accompanied by her husband for this visit.  Her last visit was on 03/14/2021.  Since that time she is had no major changes in her health.  At today's visit, Ms. Davisson reports she continues to feel "fair".  She notes that she does struggle with shortness of breath.  She notes her breathing is not good and she gets exhausted quite easily.  She notes that she works on her computer and then afterwards sits on the couch or watches TV.  She is "not doing much of anything".  She notes that she is not able to exert herself.  She continues to take potassium and folic acid.  She notes that she does have pain in her feet which is typically a constant discomfort  and occasional painful shooting sensations.  She notes that she is taking 7 mg of prednisone every morning at this point.  She does that she is not having any dark bowel movements and continues to eat a regular diet including red meat.  She has not noticed any overt signs of bleeding, bruising, or dark stools.  She denies fevers, chills, sweats, chest pain or cough. She has no other complaints. Rest of the 10 point ROS is below.    MEDICAL HISTORY:  Past Medical History:  Diagnosis Date   Abscess of dorsum of right hand 09/22/2020   Acute deep vein thrombosis (DVT) of right lower extremity ([place]) 12/13/2017   Allergic rhinitis    Anemia    Angio-edema    Anxiety    pt denies dx   Arthritis    Phreesia 10/21/2019   Asthma    Phreesia 10/21/2019   Breast cancer ([place]) 1998   Left breast, in remission   Cataract    REMOVED   CHF (congestive heart failure) ([place])    Clostridium difficile colitis 15/72/6203   Complication of anesthesia    "had hard time waking up from it several times" (02/20/2012), no anesthesia problems since 2014   Coronary artery disease    Depression    "some; don't take anything for it" (02/20/2012), pt denies dx as of 06/02/21   Diverticulosis    DVT (deep venous thrombosis) ([place])    Dyspnea    all the time   Fibromyalgia 11/2011   GERD (gastroesophageal reflux disease)    Graves disease    Headache(784.0)    "  related to allergies; more at different times during the year" (02/20/2012)   Hemorrhoids    Hiatal hernia    back and neck   History of blood transfusion 2023   1 unit, 5 units of Iron   Hx of adenomatous colonic polyps 04/12/2016   Hypercholesteremia    good cholesterol is high   Hypothyroidism    IBS (irritable bowel syndrome)    Moderate persistent asthma    -FeV1 72% 2011, -IgE 102 2011, CT sinus Neg 2011   Osteomyelitis of second toe of right foot ([place]) 09/23/2020   Osteoporosis    on reclast yearly   Peripheral vascular disease ([place]) 2019    DVTs (3) lungs and leg   Pneumonia 04/2011; ~ 11/2011   "double; single" (02/20/2012)   Septic olecranon bursitis of right elbow 09/22/2020   Seronegative rheumatoid arthritis ([place])    Dr. Lahoma Rocker   SIRS (systemic inflammatory response syndrome) ([place]) 02/10/2018   Sleep apnea    uses cpap nightly   Tracheobronchomalacia     SURGICAL HISTORY: Past Surgical History:  Procedure Laterality Date   ABDOMINAL HYSTERECTOMY N/A    Phreesia 10/21/2019   ANTERIOR AND POSTERIOR REPAIR  1990's   APPENDECTOMY     ARGON LASER APPLICATION  16/60/6301   Procedure: ARGON LASER APPLICATION;  Surgeon: Yetta Flock, MD;  Location: [facility];  Service: Gastroenterology;;   BREAST LUMPECTOMY  1998   left   BREAST SURGERY N/A    Phreesia 10/21/2019   BRONCHIAL WASHINGS  04/05/2020   Procedure: BRONCHIAL WASHINGS;  Surgeon: Freddi Starr, MD;  Location: [facility];  Service: Pulmonary;;   CAPSULOTOMY Right 08/28/2021   Procedure: CAPSULOTOMY;  Surgeon: Felipa Furnace, DPM;  Location: [place];  Service: Podiatry;  Laterality: Right;   CARPOMETACARPEL ([place]) FUSION OF THUMB WITH AUTOGRAFT FROM RADIUS  ~ 2009   "both thumbs" (02/20/2012)   CATARACT EXTRACTION W/ INTRAOCULAR LENS  IMPLANT, BILATERAL  2012   CERVICAL DISCECTOMY  10/2001   C5-C6   CERVICAL FUSION  2003   C3-C4   CHOLECYSTECTOMY     COLONOSCOPY     CORONARY STENT INTERVENTION N/A 01/11/2021   Procedure: CORONARY STENT INTERVENTION;  Surgeon: Leonie Man, MD;  Location: [facility];  Service: Cardiovascular;  Laterality: N/A;   DEBRIDEMENT TENNIS ELBOW  ?1970's   right   ESOPHAGOGASTRODUODENOSCOPY     ESOPHAGOGASTRODUODENOSCOPY (EGD) WITH PROPOFOL N/A 02/05/2021   Procedure: ESOPHAGOGASTRODUODENOSCOPY (EGD) WITH PROPOFOL;  Surgeon: Yetta Flock, MD;  [facility];  Service: Gastroenterology;  Laterality: N/A;   EYE SURGERY N/A    Phreesia 10/21/2019   HAMMER TOE SURGERY Right 08/28/2021    Procedure: HAMMER TOE REPAIR 2ND TOE RIGHT FOOT;  Surgeon: Felipa Furnace, DPM;  Location: [facility];  Service: Podiatry;  Laterality: Right;   HYSTERECTOMY     I & D EXTREMITY Right 09/22/2020   Procedure: IRRIGATION AND DEBRIDEMENT RIGHT HAND AND ELBOW;  Surgeon: Marchia Bond, MD;  Location: [facility];  Service: Orthopedics;  Laterality: Right;   KNEE ARTHROPLASTY  ?1990's   "?right; w/cartilage repair" (02/20/2012)   MASS EXCISION Left 08/28/2021   Procedure: EXCISION OF SOFT TISSUE  MASS LEFT FOOT;  Surgeon: Felipa Furnace, DPM;  Location: [place] [facility];  Service: Podiatry;  Laterality: Left;   NASAL SEPTUM SURGERY  1980's   POSTERIOR CERVICAL FUSION/FORAMINOTOMY  2004   "failed initial fusion; rewired  anterior neck" (02/20/2012)   REVERSE SHOULDER ARTHROPLASTY Right 02/16/2020  Procedure: REVERSE SHOULDER ARTHROPLASTY;  Surgeon: Marchia Bond, MD;  [facility];  Service: Orthopedics;  Laterality: Right;   RIGHT/LEFT HEART CATH AND CORONARY ANGIOGRAPHY N/A 01/11/2021   Procedure: RIGHT/LEFT HEART CATH AND CORONARY ANGIOGRAPHY;  Surgeon: Leonie Man, MD;  Location: [facility];  Service: Cardiovascular;  Laterality: N/A;   SPINE SURGERY N/A    Phreesia 10/21/2019   TONSILLECTOMY  ~ 1953   VESICOVAGINAL FISTULA CLOSURE W/ TAH  1988   VIDEO BRONCHOSCOPY Bilateral 08/23/2016   Procedure: VIDEO BRONCHOSCOPY WITH FLUORO;  Surgeon: Javier Glazier, MD;  [facility];  Service: Cardiopulmonary;  Laterality: Bilateral;   VIDEO BRONCHOSCOPY N/A 04/05/2020   Procedure: VIDEO BRONCHOSCOPY WITHOUT FLUORO;  Surgeon: Freddi Starr, MD;  [facility];  Service: Pulmonary;  Laterality: N/A;    SOCIAL HISTORY: Social History   Socioeconomic History   Marital status: Divorced    Spouse name: Not on file   Number of children: 2   Years of education: college   Highest education level: Not on file  Occupational History   Occupation: Disabled    Comment: Retired Physiological scientist: RETIRED  Tobacco Use   Smoking status: Never    Passive exposure: Yes   Smokeless tobacco: Never   Tobacco comments:    Parents  Vaping Use   Vaping Use: Never used  Substance and Sexual Activity   Alcohol use: Not Currently    Alcohol/week: 0.0 standard drinks of alcohol   Drug use: No   Sexual activity: Not Currently    Birth control/protection: Surgical    Comment: Hysterectomy  Other Topics Concern   Not on file  Social History Narrative   Patient lives at home alone. Patient  divorced.    Patient has her BS degree.   Right handed.   Caffeine- sometimes coffee.       Pulmonary:   Born in [place], [place]. She worked as a Copywriter, advertising. She has no pets currently. She does have indoor plants. Previously had mold in her home that was remediated. Carpet was removed.          Social Determinants of Health   Financial Resource Strain: Low Risk  (07/07/2020)   Overall Financial Resource Strain (CARDIA)    Difficulty of Paying Living Expenses: Not hard at all  Food Insecurity: No Food Insecurity (07/13/2021)   Hunger Vital Sign    Worried About Running Out of Food in the Last Year: Never true    Ran Out of Food in the Last Year: Never true  Transportation Needs: No Transportation Needs (07/13/2021)   PRAPARE - Hydrologist (Medical): No    Lack of Transportation (Non-Medical): No  Physical Activity: Insufficiently Active (07/13/2021)   Exercise Vital Sign    Days of Exercise per Week: 5 days    Minutes of Exercise per Session: 20 min  Stress: No Stress Concern Present (07/13/2021)   [place]    Feeling of Stress : Only a little  Social Connections: Moderately Isolated (07/13/2021)   Social Connection and Isolation Panel [NHANES]    Frequency of Communication with Friends and Family: More than three times a week    Frequency of Social  Gatherings with Friends and Family: More than three times a week    Attends Religious Services: 1 to 4 times per year    Active Member of Clubs or  Organizations: No    Attends Archivist Meetings: Never    Marital Status: Divorced  Human resources officer Violence: Not At Risk (07/13/2021)   Humiliation, Afraid, Rape, and Kick questionnaire    Fear of Current or Ex-Partner: No    Emotionally Abused: No    Physically Abused: No    Sexually Abused: No    FAMILY HISTORY: Family History  Problem Relation Age of Onset   Allergies Mother    Heart disease Mother    Arthritis Mother    Lung cancer Mother        heavy smoker   Diabetes Mother    Allergies Father    Heart disease Father    Arthritis Father    Stroke Father    Heart disease Brother    Diabetes Maternal Grandfather    Colitis Daughter    Colon cancer Other        Maternal half uncle    ALLERGIES:  is allergic to baclofen, dust mite extract, molds & smuts, morphine and related, other, penicillins, rofecoxib, shellfish allergy, tetracycline hcl, xolair [omalizumab], zoledronic acid, dilaudid [hydromorphone hcl], levofloxacin, oxycodone hcl, paroxetine, celecoxib, diltiazem hcl, hydrocodone-acetaminophen, lactose intolerance (gi), rituximab, tree extract, gamunex [immune globulin], and penicillin g procaine.  MEDICATIONS:  Current Outpatient Medications  Medication Sig Dispense Refill   acetaminophen (TYLENOL) 650 MG CR tablet Take 1,300 mg by mouth every 8 (eight) hours as needed for pain.     albuterol (VENTOLIN HFA) 108 (90 Base) MCG/ACT inhaler Inhale 2 puffs into the lungs every 6 (six) hours as needed for wheezing or shortness of breath. 19 g 1   allopurinol (ZYLOPRIM) 100 MG tablet Take 1 tablet (100 mg total) by mouth daily. 90 tablet 3   Alpha-D-Galactosidase (BEANO PO) Take 2 tablets by mouth 3 (three) times daily with meals.     Alpha-Lipoic Acid 600 MG TABS Take 600 mg by mouth daily.     aspirin EC 81 MG  tablet Take 81 mg by mouth daily. Swallow whole. Taking ASA while patient is off Plavix.  When patient starts back on Plavix, she will stop ASA per MD.     atorvastatin (LIPITOR) 80 MG tablet Take 1 tablet (80 mg total) by mouth daily. 90 tablet 3   azelastine (ASTELIN) 0.1 % nasal spray Place 2 sprays into both nostrils daily. Use in each nostril as directed (Patient taking differently: Place 2 sprays into both nostrils at bedtime as needed for rhinitis. Use in each nostril as directed) 30 mL 5   benzonatate (TESSALON) 200 MG capsule TAKE 1 CAPSULE BY MOUTH THREE TIMES DAILY AS NEEDED FOR COUGH 30 capsule 0   Calcium-Magnesium-Vitamin D (CALCIUM MAGNESIUM PO) Take 1 tablet by mouth daily.     cetirizine (ZYRTEC) 10 MG tablet Take 1 tablet (10 mg total) by mouth 2 (two) times daily as needed for allergies (Can take a n extra dose during flare ups.). (Patient taking differently: Take 10 mg by mouth every evening.) 60 tablet 5   cholecalciferol (VITAMIN D) 25 MCG (1000 UNIT) tablet Take 1,000 Units by mouth every evening.     clopidogrel (PLAVIX) 75 MG tablet Take 1 tablet (75 mg total) by mouth daily. 90 tablet 3   cyproheptadine (PERIACTIN) 4 MG tablet Take 2 tablets (8 mg total) by mouth at bedtime. Take 2 tablets at bedtime as needed to treat facial pain or sleep dysfunction. (Patient taking differently: Take 8 mg by mouth at bedtime.) 60 tablet 5   denosumab (  PROLIA) 60 MG/ML SOSY injection Inject 60 mg into the skin every 6 (six) months.     dexlansoprazole (DEXILANT) 60 MG capsule Take 1 capsule (60 mg total) by mouth daily. 90 capsule 3   diclofenac Sodium (VOLTAREN) 1 % GEL APPLY 2 GRAMS TOPICALLY 4 TIMES DAILY (Patient taking differently: Apply 1 Application topically 4 (four) times daily as needed (pain).) 100 g 1   empagliflozin (JARDIANCE) 10 MG TABS tablet Take 1 tablet (10 mg total) by mouth daily before breakfast. 30 tablet 5   EPINEPHrine 0.3 mg/0.3 mL IJ SOAJ injection USE AS DIRECTED  BY YOUR PHYSICIAN INTRAMUSCULARLY AS NEEDED FOR ANAPHYLAXIS 2 each 1   famotidine (PEPCID) 20 MG tablet Take 1 tablet (20 mg total) by mouth at bedtime. 30 tablet 5   fluticasone (FLONASE) 50 MCG/ACT nasal spray USE 2 SPRAYS IN EACH NOSTRIL DAILY (Patient taking differently: Place 2 sprays into both nostrils daily as needed for allergies.) 16 g 2   folic acid (FOLVITE) 1 MG tablet Take 1 tablet (1 mg total) by mouth daily. 90 tablet 3   furosemide (LASIX) 40 MG tablet TAKE 1 TABLET BY MOUTH TWICE DAILY FOR 3 DAYS, THEN BACK TO ONCE DAILY (Patient taking differently: Take 40 mg by mouth daily.) 40 tablet 1   gabapentin (NEURONTIN) 100 MG capsule Take 1 capsule (100 mg total) by mouth 3 (three) times daily. 90 capsule 3   guaiFENesin (MUCINEX) 600 MG 12 hr tablet Take 1 tablet (600 mg total) by mouth 2 (two) times daily as needed for cough or to loosen phlegm. (Patient taking differently: Take 600 mg by mouth 2 (two) times daily.) 60 tablet 1   HIZENTRA 1 GM/5ML SOLN Hizentra 3 times/5 ml = 65 ml total once weekly on Fridays     isosorbide-hydrALAZINE (BIDIL) 20-37.5 MG tablet Take 1 tablet by mouth every evening.     Lactase (LACTOSE FAST ACTING RELIEF PO) Take 3 tablets by mouth 3 (three) times daily.     lidocaine-prilocaine (EMLA) cream Apply 1 application  topically as needed (pain).     magnesium oxide (MAG-OX) 400 (240 Mg) MG tablet Take 400 mg by mouth daily.     Menthol, Topical Analgesic, (BIOFREEZE) 4 % GEL Apply 1 application. topically daily as needed (pain).     mirtazapine (REMERON SOL-TAB) 30 MG disintegrating tablet Take 30 mg by mouth at bedtime as needed (sleep).     montelukast (SINGULAIR) 10 MG tablet Take 1 tablet (10 mg total) by mouth daily. (Patient taking differently: Take 10 mg by mouth every evening.) 30 tablet 5   Nystatin (GERHARDT'S BUTT CREAM) CREA Apply topically 3 times a day as needed for itching or irritation 4 each 6   nystatin (MYCOSTATIN) 100000 UNIT/ML suspension  Use as directed 15 mLs in the mouth or throat at bedtime as needed (mouth sores).     nystatin-triamcinolone ointment (MYCOLOG) Apply 1 Application topically 2 (two) times daily as needed (vaginal irritation).     potassium chloride SA (KLOR-CON M) 20 MEQ tablet Take 20 mEq by mouth daily.     predniSONE (DELTASONE) 1 MG tablet TAKE TWO TABLET WITH BREAKFAST... TAKE WITH 10 MG FOR A TOTAL OF 12 MG DAILY (Patient taking differently: Take 2 mg by mouth. Total of 7 mg daily.) 60 tablet 0   predniSONE (DELTASONE) 5 MG tablet Take 1 tablet (5 mg total) by mouth daily with breakfast. Along with 2 mg tablets for total daily dose 7 mg total daily 90 tablet  0   Prenatal Vit-Fe Fumarate-FA (PRENATAL VITAMINS PO) Take 1 tablet by mouth daily.     Probiotic Product (ALIGN PO) Take 1 capsule by mouth daily.     Propylene Glycol (SYSTANE COMPLETE) 0.6 % SOLN Place 1 drop into both eyes 2 (two) times daily. 15 mL 5   Tiotropium Bromide-Olodaterol (STIOLTO RESPIMAT) 2.5-2.5 MCG/ACT AERS Inhale 2 puffs into the lungs daily. (Patient taking differently: Inhale 2 puffs into the lungs every evening.) 4 g 11   traMADol (ULTRAM) 50 MG tablet TAKE 1 TABLET BY MOUTH DAILY AT BEDTIME 30 tablet 2   valsartan (DIOVAN) 80 MG tablet Take 1 tablet (80 mg total) by mouth daily. 90 tablet 3   XARELTO 20 MG TABS tablet TAKE 1 TABLET BY MOUTH DAILY WITH SUPPER 90 tablet 3   No current facility-administered medications for this visit.   Facility-Administered Medications Ordered in Other Visits  Medication Dose Route Frequency Provider Last Rate Last Admin   iohexol (OMNIPAQUE) 350 MG/ML injection    PRN Leonie Man, MD   105 mL at 01/11/21 0955    REVIEW OF SYSTEMS:   Constitutional: ( - ) fevers, ( - )  chills , ( - ) night sweats Eyes: ( - ) blurriness of vision, ( - ) double vision, ( - ) watery eyes Ears, nose, mouth, throat, and face: ( - ) mucositis, ( - ) sore throat Respiratory: ( - ) cough, ( + ) dyspnea, ( - )  wheezes Cardiovascular: ( - ) palpitation, ( - ) chest discomfort, ( - ) lower extremity swelling Gastrointestinal:  ( - ) nausea, ( - ) heartburn, ( - ) change in bowel habits Skin: ( - ) abnormal skin rashes Lymphatics: ( - ) new lymphadenopathy, ( - ) easy bruising Neurological: ( + ) numbness, ( - ) tingling, ( - ) new weaknesses Behavioral/Psych: ( - ) mood change, ( - ) new changes  All other systems were reviewed with the patient and are negative.  PHYSICAL EXAMINATION: ECOG PERFORMANCE STATUS: 2 - Symptomatic, <50% confined to bed  Vitals:   09/19/21 1032  BP: (!) 115/44  Pulse: 85  Resp: 15  Temp: 98 F (36.7 C)  SpO2: 96%    Filed Weights   09/19/21 1032  Weight: 137 lb 6.4 oz (62.3 kg)     GENERAL: well appearing female in NAD.  Has moon facies classic for steroid use. SKIN: skin color, texture, turgor are normal, no rashes or significant lesions EYES: conjunctiva are pink and non-injected, sclera clear LUNGS: clear to auscultation and percussion with normal breathing effort HEART: regular rate & rhythm and no murmurs and no lower extremity edema Musculoskeletal: no cyanosis of digits and no clubbing  PSYCH: alert & oriented x 3, fluent speech NEURO: no focal motor/sensory deficits  LABORATORY DATA:  I have reviewed the data as listed    Latest Ref Rng & Units 09/19/2021   10:18 AM 08/14/2021   11:56 AM 05/24/2021    2:59 PM  CBC  WBC 4.0 - 10.5 K/uL 11.0  11.1  12.3   Hemoglobin 12.0 - 15.0 g/dL 9.1  10.7  11.9   Hematocrit 36.0 - 46.0 % 28.8  31.8  37.1   Platelets 150 - 400 K/uL 219  164  196        Latest Ref Rng & Units 09/19/2021   10:18 AM 08/14/2021   11:56 AM 05/24/2021    2:59 PM  CMP  Glucose 70 -  99 mg/dL 177  218  196   BUN 8 - 23 mg/dL 35  38  33   Creatinine 0.44 - 1.00 mg/dL 1.72  1.37  1.44   Sodium 135 - 145 mmol/L 140  141  140   Potassium 3.5 - 5.1 mmol/L 4.4  4.4  4.4   Chloride 98 - 111 mmol/L 106  105  104   CO2 22 - 32 mmol/L $RemoveB'26   27  25   'vVrRWRGq$ Calcium 8.9 - 10.3 mg/dL 8.8  8.9  9.3   Total Protein 6.5 - 8.1 g/dL 7.2  6.3  7.2   Total Bilirubin 0.3 - 1.2 mg/dL 0.5  0.6  0.4   Alkaline Phos 38 - 126 U/L 49   76   AST 15 - 41 U/L 40  59  70   ALT 0 - 44 U/L 38  73  87     ASSESSMENT & PLAN Ebony Scott is a 71 y.o. female who returns for a follow up for iron deficiency anemia.   #Iron deficiency anemia 2/2 GI bleed: --EGD 02/05/21 revealed gastric antral vascular ectasia, treated with APC.  --Under the care of [facility], last seen on 02/24/2021. Plan to hold off on further endoscopic evaluation for at least 6 months post her stent placement in order to safely take her off her blood thinners/antiplatelet agents at that time. --Unable to tolerate iron pills due to constipation --Received IV venofer 200 mg x 5 doses from 03/15/2021-04/12/2021 Plan: --Labs from today show considerable decline from 05/24/2021.  Labs show white blood cell count 11.0, hemoglobin 9.0, MCV 114.3, and platelets of 219.  Iron studies appear to be within normal limits. --We will order new set of labs to include DAT, LDH, haptoglobin, vitamin B12, and methylmalonic acid.  We will also order multiple myeloma panel and copper.  If these tests are negative we will need to pursue bone marrow biopsy. --No additional IV iron needed at this time.  --RTC in 3 months with labs  #Fatigue: --Checked TSH levels previously in April 2023, found to be normal. --Consider follow up with PCP to further evaluate other etiologies for fatigue since anemia has improved.   # Hx of DVT and PE: --First episode of acute DVT involving the left posterior tibial and pertoneal veins on 02/23/2012.  --Second episode acute DVT involving right peroneal vein and bilateral pulmonary emboli on 12/13/2017. Felt to be unprovoked so recommended indefinite anticoagulation.  --Currently on Xarelto 20 mg once daily. Recommend to consider maintenance dose of Xarelto 10 mg once daily to minimize  bleeding risk.   #Seronegative rheumatoid arthritis. --Currently on Prednisone 7 mg daily.    # CVID/hypogammaglobulinemia  --Receiving IVIG tx   Orders Placed This Encounter  Procedures   Lactate dehydrogenase (LDH)    Standing Status:   Future    Number of Occurrences:   1    Standing Expiration Date:   09/19/2022   Haptoglobin    Standing Status:   Future    Number of Occurrences:   1    Standing Expiration Date:   09/19/2022   Vitamin B12    Standing Status:   Future    Number of Occurrences:   1    Standing Expiration Date:   09/19/2022   Methylmalonic acid, serum    Standing Status:   Future    Number of Occurrences:   1    Standing Expiration Date:   09/19/2022   Folate, Serum  Standing Status:   Future    Number of Occurrences:   1    Standing Expiration Date:   09/19/2022   Multiple Myeloma Panel (SPEP&IFE w/QIG)    Standing Status:   Future    Number of Occurrences:   1    Standing Expiration Date:   09/19/2022   Kappa/lambda light chains    Standing Status:   Future    Number of Occurrences:   1    Standing Expiration Date:   09/19/2022   Copper, serum    Standing Status:   Future    Number of Occurrences:   1    Standing Expiration Date:   09/19/2022   Direct antiglobulin test ([facility])    Standing Status:   Future    Number of Occurrences:   1    Standing Expiration Date:   09/19/2022    All questions were answered. The patient knows to call the clinic with any problems, questions or concerns.  I have spent a total of 30 minutes minutes of face-to-face and non-face-to-face time, preparing to see the patient,  performing a medically appropriate examination, counseling and educating the patient, documenting clinical information in the electronic health record, and care coordination.   Ledell Peoples, MD Department of Hematology/Oncology [place] at [facility] Phone: [phone] Pager: [phone] Email:  [email or web address]

## 2021-09-20 ENCOUNTER — Ambulatory Visit (INDEPENDENT_AMBULATORY_CARE_PROVIDER_SITE_OTHER): Payer: Medicare PPO | Admitting: Podiatry

## 2021-09-20 ENCOUNTER — Telehealth: Payer: Self-pay | Admitting: Hematology and Oncology

## 2021-09-20 DIAGNOSIS — Z9889 Other specified postprocedural states: Secondary | ICD-10-CM

## 2021-09-20 DIAGNOSIS — M2041 Other hammer toe(s) (acquired), right foot: Secondary | ICD-10-CM

## 2021-09-20 DIAGNOSIS — M24574 Contracture, right foot: Secondary | ICD-10-CM

## 2021-09-20 DIAGNOSIS — M7989 Other specified soft tissue disorders: Secondary | ICD-10-CM

## 2021-09-20 LAB — HAPTOGLOBIN: Haptoglobin: 291 mg/dL (ref 42–346)

## 2021-09-20 LAB — KAPPA/LAMBDA LIGHT CHAINS
Kappa free light chain: 21 mg/L — ABNORMAL HIGH (ref 3.3–19.4)
Kappa, lambda light chain ratio: 1.44 (ref 0.26–1.65)
Lambda free light chains: 14.6 mg/L (ref 5.7–26.3)

## 2021-09-20 NOTE — Progress Notes (Signed)
Subjective:  Patient ID: Ebony Scott, female    DOB: 01-30-1951,  MRN: [id number]  Chief Complaint  Patient presents with   Routine Post Op    POV #2 DOS 08/28/2021 RIGHT 2ND HAMMERTOE WITH 2ND CAPSULOTOMY OF MPJ AND LEFT EXC OF SOFT TISSUE MASS    DOS: 08/28/2021 Procedure: Right second digit hammertoe correction with PIPJ arthroplasty and capsulotomy with left excision of soft tissue mass.  71 y.o. female returns for post-op check.  Patient states she is doing okay.  Pain is controlled.  She has not any gabapentin she will be picking up today to pick up gabapentin.  Denies any other acute complaints weightbearing as tolerated with surgical shoe bilaterally  Review of Systems: Negative except as noted in the HPI. Denies N/V/F/Ch.  Past Medical History:  Diagnosis Date   Abscess of dorsum of right hand 09/22/2020   Acute deep vein thrombosis (DVT) of right lower extremity ([place]) 12/13/2017   Allergic rhinitis    Anemia    Angio-edema    Anxiety    pt denies dx   Arthritis    Phreesia 10/21/2019   Asthma    Phreesia 10/21/2019   Breast cancer ([place]) 1998   Left breast, in remission   Cataract    REMOVED   CHF (congestive heart failure) ([place])    Clostridium difficile colitis 82/95/6213   Complication of anesthesia    "had hard time waking up from it several times" (02/20/2012), no anesthesia problems since 2014   Coronary artery disease    Depression    "some; don't take anything for it" (02/20/2012), pt denies dx as of 06/02/21   Diverticulosis    DVT (deep venous thrombosis) ([place])    Dyspnea    all the time   Fibromyalgia 11/2011   GERD (gastroesophageal reflux disease)    Graves disease    Headache(784.0)    "related to allergies; more at different times during the year" (02/20/2012)   Hemorrhoids    Hiatal hernia    back and neck   History of blood transfusion 2023   1 unit, 5 units of Iron   Hx of adenomatous colonic polyps 04/12/2016   Hypercholesteremia    good  cholesterol is high   Hypothyroidism    IBS (irritable bowel syndrome)    Moderate persistent asthma    -FeV1 72% 2011, -IgE 102 2011, CT sinus Neg 2011   Osteomyelitis of second toe of right foot ([place]) 09/23/2020   Osteoporosis    on reclast yearly   Peripheral vascular disease ([place]) 2019   DVTs (3) lungs and leg   Pneumonia 04/2011; ~ 11/2011   "double; single" (02/20/2012)   Septic olecranon bursitis of right elbow 09/22/2020   Seronegative rheumatoid arthritis ([place])    Dr. Lahoma Rocker   SIRS (systemic inflammatory response syndrome) ([place]) 02/10/2018   Sleep apnea    uses cpap nightly   Tracheobronchomalacia     Current Outpatient Medications:    acetaminophen (TYLENOL) 650 MG CR tablet, Take 1,300 mg by mouth every 8 (eight) hours as needed for pain., Disp: , Rfl:    albuterol (VENTOLIN HFA) 108 (90 Base) MCG/ACT inhaler, Inhale 2 puffs into the lungs every 6 (six) hours as needed for wheezing or shortness of breath., Disp: 19 g, Rfl: 1   allopurinol (ZYLOPRIM) 100 MG tablet, Take 1 tablet (100 mg total) by mouth daily., Disp: 90 tablet, Rfl: 3   Alpha-D-Galactosidase (BEANO PO), Take 2 tablets by mouth 3 (three)  times daily with meals., Disp: , Rfl:    Alpha-Lipoic Acid 600 MG TABS, Take 600 mg by mouth daily., Disp: , Rfl:    aspirin EC 81 MG tablet, Take 81 mg by mouth daily. Swallow whole. Taking ASA while patient is off Plavix.  When patient starts back on Plavix, she will stop ASA per MD., Disp: , Rfl:    atorvastatin (LIPITOR) 80 MG tablet, Take 1 tablet (80 mg total) by mouth daily., Disp: 90 tablet, Rfl: 3   azelastine (ASTELIN) 0.1 % nasal spray, Place 2 sprays into both nostrils daily. Use in each nostril as directed (Patient taking differently: Place 2 sprays into both nostrils at bedtime as needed for rhinitis. Use in each nostril as directed), Disp: 30 mL, Rfl: 5   benzonatate (TESSALON) 200 MG capsule, TAKE 1 CAPSULE BY MOUTH THREE TIMES DAILY AS NEEDED FOR COUGH,  Disp: 30 capsule, Rfl: 0   Calcium-Magnesium-Vitamin D (CALCIUM MAGNESIUM PO), Take 1 tablet by mouth daily., Disp: , Rfl:    cetirizine (ZYRTEC) 10 MG tablet, Take 1 tablet (10 mg total) by mouth 2 (two) times daily as needed for allergies (Can take a n extra dose during flare ups.). (Patient taking differently: Take 10 mg by mouth every evening.), Disp: 60 tablet, Rfl: 5   cholecalciferol (VITAMIN D) 25 MCG (1000 UNIT) tablet, Take 1,000 Units by mouth every evening., Disp: , Rfl:    clopidogrel (PLAVIX) 75 MG tablet, Take 1 tablet (75 mg total) by mouth daily., Disp: 90 tablet, Rfl: 3   cyproheptadine (PERIACTIN) 4 MG tablet, Take 2 tablets (8 mg total) by mouth at bedtime. Take 2 tablets at bedtime as needed to treat facial pain or sleep dysfunction. (Patient taking differently: Take 8 mg by mouth at bedtime.), Disp: 60 tablet, Rfl: 5   denosumab (PROLIA) 60 MG/ML SOSY injection, Inject 60 mg into the skin every 6 (six) months., Disp: , Rfl:    dexlansoprazole (DEXILANT) 60 MG capsule, Take 1 capsule (60 mg total) by mouth daily., Disp: 90 capsule, Rfl: 3   diclofenac Sodium (VOLTAREN) 1 % GEL, APPLY 2 GRAMS TOPICALLY 4 TIMES DAILY (Patient taking differently: Apply 1 Application topically 4 (four) times daily as needed (pain).), Disp: 100 g, Rfl: 1   empagliflozin (JARDIANCE) 10 MG TABS tablet, Take 1 tablet (10 mg total) by mouth daily before breakfast., Disp: 30 tablet, Rfl: 5   EPINEPHrine 0.3 mg/0.3 mL IJ SOAJ injection, USE AS DIRECTED BY YOUR PHYSICIAN INTRAMUSCULARLY AS NEEDED FOR ANAPHYLAXIS, Disp: 2 each, Rfl: 1   famotidine (PEPCID) 20 MG tablet, Take 1 tablet (20 mg total) by mouth at bedtime., Disp: 30 tablet, Rfl: 5   fluticasone (FLONASE) 50 MCG/ACT nasal spray, USE 2 SPRAYS IN EACH NOSTRIL DAILY (Patient taking differently: Place 2 sprays into both nostrils daily as needed for allergies.), Disp: 16 g, Rfl: 2   folic acid (FOLVITE) 1 MG tablet, Take 1 tablet (1 mg total) by mouth  daily., Disp: 90 tablet, Rfl: 3   furosemide (LASIX) 40 MG tablet, TAKE 1 TABLET BY MOUTH TWICE DAILY FOR 3 DAYS, THEN BACK TO ONCE DAILY (Patient taking differently: Take 40 mg by mouth daily.), Disp: 40 tablet, Rfl: 1   gabapentin (NEURONTIN) 100 MG capsule, Take 1 capsule (100 mg total) by mouth 3 (three) times daily., Disp: 90 capsule, Rfl: 3   guaiFENesin (MUCINEX) 600 MG 12 hr tablet, Take 1 tablet (600 mg total) by mouth 2 (two) times daily as needed for cough or  to loosen phlegm. (Patient taking differently: Take 600 mg by mouth 2 (two) times daily.), Disp: 60 tablet, Rfl: 1   HIZENTRA 1 GM/5ML SOLN, Hizentra 3 times/5 ml = 65 ml total once weekly on Fridays, Disp: , Rfl:    isosorbide-hydrALAZINE (BIDIL) 20-37.5 MG tablet, Take 1 tablet by mouth every evening., Disp: , Rfl:    Lactase (LACTOSE FAST ACTING RELIEF PO), Take 3 tablets by mouth 3 (three) times daily., Disp: , Rfl:    lidocaine-prilocaine (EMLA) cream, Apply 1 application  topically as needed (pain)., Disp: , Rfl:    magnesium oxide (MAG-OX) 400 (240 Mg) MG tablet, Take 400 mg by mouth daily., Disp: , Rfl:    Menthol, Topical Analgesic, (BIOFREEZE) 4 % GEL, Apply 1 application. topically daily as needed (pain)., Disp: , Rfl:    mirtazapine (REMERON SOL-TAB) 30 MG disintegrating tablet, Take 30 mg by mouth at bedtime as needed (sleep)., Disp: , Rfl:    montelukast (SINGULAIR) 10 MG tablet, Take 1 tablet (10 mg total) by mouth daily. (Patient taking differently: Take 10 mg by mouth every evening.), Disp: 30 tablet, Rfl: 5   Nystatin (GERHARDT'S BUTT CREAM) CREA, Apply topically 3 times a day as needed for itching or irritation, Disp: 4 each, Rfl: 6   nystatin (MYCOSTATIN) 100000 UNIT/ML suspension, Use as directed 15 mLs in the mouth or throat at bedtime as needed (mouth sores)., Disp: , Rfl:    nystatin-triamcinolone ointment (MYCOLOG), Apply 1 Application topically 2 (two) times daily as needed (vaginal irritation)., Disp: , Rfl:     potassium chloride SA (KLOR-CON M) 20 MEQ tablet, Take 20 mEq by mouth daily., Disp: , Rfl:    predniSONE (DELTASONE) 1 MG tablet, TAKE TWO TABLET WITH BREAKFAST... TAKE WITH 10 MG FOR A TOTAL OF 12 MG DAILY (Patient taking differently: Take 2 mg by mouth. Total of 7 mg daily.), Disp: 60 tablet, Rfl: 0   predniSONE (DELTASONE) 5 MG tablet, Take 1 tablet (5 mg total) by mouth daily with breakfast. Along with 2 mg tablets for total daily dose 7 mg total daily, Disp: 90 tablet, Rfl: 0   Prenatal Vit-Fe Fumarate-FA (PRENATAL VITAMINS PO), Take 1 tablet by mouth daily., Disp: , Rfl:    Probiotic Product (ALIGN PO), Take 1 capsule by mouth daily., Disp: , Rfl:    Propylene Glycol (SYSTANE COMPLETE) 0.6 % SOLN, Place 1 drop into both eyes 2 (two) times daily., Disp: 15 mL, Rfl: 5   Tiotropium Bromide-Olodaterol (STIOLTO RESPIMAT) 2.5-2.5 MCG/ACT AERS, Inhale 2 puffs into the lungs daily. (Patient taking differently: Inhale 2 puffs into the lungs every evening.), Disp: 4 g, Rfl: 11   traMADol (ULTRAM) 50 MG tablet, TAKE 1 TABLET BY MOUTH DAILY AT BEDTIME, Disp: 30 tablet, Rfl: 2   valsartan (DIOVAN) 80 MG tablet, Take 1 tablet (80 mg total) by mouth daily., Disp: 90 tablet, Rfl: 3   XARELTO 20 MG TABS tablet, TAKE 1 TABLET BY MOUTH DAILY WITH SUPPER, Disp: 90 tablet, Rfl: 3 No current facility-administered medications for this visit.  Facility-Administered Medications Ordered in Other Visits:    iohexol (OMNIPAQUE) 350 MG/ML injection, , , PRN, Leonie Man, MD, 105 mL at 01/11/21 8032  Social History   Tobacco Use  Smoking Status Never   Passive exposure: Yes  Smokeless Tobacco Never  Tobacco Comments   Parents    Allergies  Allergen Reactions   Baclofen Other (See Comments)    Altered mental status requiring a 3-day hospital stay- patient became  unresponsive   Dust Mite Extract Shortness Of Breath and Other (See Comments)    "sneezing" (02/20/2012)   Molds & Smuts Shortness Of Breath    Morphine And Related Hives and Itching   Other Shortness Of Breath and Other (See Comments)    Grass and weeds "sneezing; filled sinuses" (02/20/2012)   Penicillins Rash and Other (See Comments)    "welts" (02/20/2012) Tolerated Ancef 02/16/20.    Rofecoxib Swelling and Other (See Comments)    Vioxx- feet swelling    Shellfish Allergy Anaphylaxis, Shortness Of Breath, Itching and Rash   Tetracycline Hcl Nausea And Vomiting   Xolair [Omalizumab] Other (See Comments)    Caused Blood clot   Zoledronic Acid Other (See Comments)    Fever, Put in hospital, dr said it was a reaction from a reaction  Other reaction(s): Unknown   Dilaudid [Hydromorphone Hcl] Itching   Levofloxacin Other (See Comments)    GI upset   Oxycodone Hcl Nausea And Vomiting   Paroxetine Nausea And Vomiting and Other (See Comments)    Paxil    Celecoxib Swelling and Other (See Comments)    Feet swelling   Diltiazem Hcl Swelling   Hydrocodone-Acetaminophen Itching   Lactose Intolerance (Gi) Other (See Comments)    Bloating and gas   Rituximab     Anemia    Tree Extract Other (See Comments)    "tested and told I was allergic to it; never experienced a reaction to it" (02/20/2012)   Gamunex [Immune Globulin] Itching and Rash    Tolerates hizentra    Penicillin G Procaine Rash and Other (See Comments)    Welts, also   Objective:  There were no vitals filed for this visit. There is no height or weight on file to calculate BMI. Constitutional Well developed. Well nourished.  Vascular Foot warm and well perfused. Capillary refill normal to all digits.   Neurologic Normal speech. Oriented to person, place, and time. Epicritic sensation to light touch grossly present bilaterally.  Dermatologic Skin completely reepithelialized.  Good correction alignment noted.  Reduction of soft tissue mass noted.  Orthopedic: No further tenderness to palpation noted about the surgical site.   Radiographs: 3 views of skeletally  mature adult right foot: Pin is intact no signs of backing out or loosening noted.  Good correction alignment noted. Assessment:   1. Status post foot surgery   2. Hammertoe of second toe of right foot   3. Joint contracture of foot, right   4. Mass of soft tissue of left upper extremity     Plan:  Patient was evaluated and treated and all questions answered.  S/p foot surgery bilaterally -Progressing as expected post-operatively. -XR: See above -WB Status: Weightbearing as tolerated in regular shoes -Sutures: Removed no dehiscence noted.  No complication noted. -Medications: None -Clinically both the incision site has healed.  No hematomas and good correction alignment no recurrence of soft tissue mass noted as well.  If any foot and ankle issues arise future of asked her to come back and see me.  She states understanding.  No follow-ups on file.

## 2021-09-20 NOTE — Telephone Encounter (Signed)
Per 8/1 los called and spoke to pt about appointment  pt confirmed appointment

## 2021-09-21 ENCOUNTER — Telehealth: Payer: Self-pay

## 2021-09-21 DIAGNOSIS — E261 Secondary hyperaldosteronism: Secondary | ICD-10-CM | POA: Diagnosis not present

## 2021-09-21 DIAGNOSIS — M1990 Unspecified osteoarthritis, unspecified site: Secondary | ICD-10-CM | POA: Diagnosis not present

## 2021-09-21 DIAGNOSIS — Z7902 Long term (current) use of antithrombotics/antiplatelets: Secondary | ICD-10-CM | POA: Diagnosis not present

## 2021-09-21 DIAGNOSIS — R32 Unspecified urinary incontinence: Secondary | ICD-10-CM | POA: Diagnosis not present

## 2021-09-21 DIAGNOSIS — M069 Rheumatoid arthritis, unspecified: Secondary | ICD-10-CM | POA: Diagnosis not present

## 2021-09-21 DIAGNOSIS — Z7409 Other reduced mobility: Secondary | ICD-10-CM | POA: Diagnosis not present

## 2021-09-21 DIAGNOSIS — I2510 Atherosclerotic heart disease of native coronary artery without angina pectoris: Secondary | ICD-10-CM | POA: Diagnosis not present

## 2021-09-21 DIAGNOSIS — G4733 Obstructive sleep apnea (adult) (pediatric): Secondary | ICD-10-CM | POA: Diagnosis not present

## 2021-09-21 DIAGNOSIS — E785 Hyperlipidemia, unspecified: Secondary | ICD-10-CM | POA: Diagnosis not present

## 2021-09-21 DIAGNOSIS — K219 Gastro-esophageal reflux disease without esophagitis: Secondary | ICD-10-CM | POA: Diagnosis not present

## 2021-09-21 DIAGNOSIS — I509 Heart failure, unspecified: Secondary | ICD-10-CM | POA: Diagnosis not present

## 2021-09-21 DIAGNOSIS — J449 Chronic obstructive pulmonary disease, unspecified: Secondary | ICD-10-CM | POA: Diagnosis not present

## 2021-09-21 DIAGNOSIS — E1142 Type 2 diabetes mellitus with diabetic polyneuropathy: Secondary | ICD-10-CM | POA: Diagnosis not present

## 2021-09-21 DIAGNOSIS — J302 Other seasonal allergic rhinitis: Secondary | ICD-10-CM | POA: Diagnosis not present

## 2021-09-21 DIAGNOSIS — G6181 Chronic inflammatory demyelinating polyneuritis: Secondary | ICD-10-CM | POA: Diagnosis not present

## 2021-09-21 DIAGNOSIS — M109 Gout, unspecified: Secondary | ICD-10-CM | POA: Diagnosis not present

## 2021-09-21 DIAGNOSIS — I110 Hypertensive heart disease with heart failure: Secondary | ICD-10-CM | POA: Diagnosis not present

## 2021-09-21 DIAGNOSIS — Z7901 Long term (current) use of anticoagulants: Secondary | ICD-10-CM | POA: Diagnosis not present

## 2021-09-21 NOTE — Telephone Encounter (Signed)
Rep stopped by to give update on patient's progress with vest treatment. Information has been copied, labeled, placed in bulk scanning, and placed in Dr. Bruna Potter office for review.

## 2021-09-22 LAB — MULTIPLE MYELOMA PANEL, SERUM
Albumin SerPl Elph-Mcnc: 3.2 g/dL (ref 2.9–4.4)
Albumin/Glob SerPl: 1 (ref 0.7–1.7)
Alpha 1: 0.3 g/dL (ref 0.0–0.4)
Alpha2 Glob SerPl Elph-Mcnc: 1.2 g/dL — ABNORMAL HIGH (ref 0.4–1.0)
B-Globulin SerPl Elph-Mcnc: 0.9 g/dL (ref 0.7–1.3)
Gamma Glob SerPl Elph-Mcnc: 1 g/dL (ref 0.4–1.8)
Globulin, Total: 3.4 g/dL (ref 2.2–3.9)
IgA: 83 mg/dL (ref 64–422)
IgG (Immunoglobin G), Serum: 913 mg/dL (ref 586–1602)
IgM (Immunoglobulin M), Srm: 45 mg/dL (ref 26–217)
Total Protein ELP: 6.6 g/dL (ref 6.0–8.5)

## 2021-09-22 LAB — COPPER, SERUM: Copper: 131 ug/dL (ref 80–158)

## 2021-09-22 LAB — METHYLMALONIC ACID, SERUM: Methylmalonic Acid, Quantitative: 336 nmol/L (ref 0–378)

## 2021-09-24 ENCOUNTER — Other Ambulatory Visit: Payer: Self-pay | Admitting: Hematology and Oncology

## 2021-09-24 DIAGNOSIS — D509 Iron deficiency anemia, unspecified: Secondary | ICD-10-CM

## 2021-09-24 DIAGNOSIS — D539 Nutritional anemia, unspecified: Secondary | ICD-10-CM

## 2021-09-25 ENCOUNTER — Telehealth: Payer: Self-pay | Admitting: *Deleted

## 2021-09-25 NOTE — Telephone Encounter (Signed)
TCT patient regarding recent lab results.  Spoke with her regarding lab results. Advised that her labs did not reveal a clear cause for her anemia. Because of that, Dr. Lorenso Courier has ordered a bone marrow biopsy.  Pt voiced understanding. She states she has been called about that already and it is scheduled for 10/19/21. Reviewed her pre-procedure needs with her. She is on Xarelto. She is asking if she should take that or not. Advised that I will ask Dr. Lorenso Courier and call her back. She voiced understanding.

## 2021-09-25 NOTE — Telephone Encounter (Signed)
-----  Message from Orson Slick, MD sent at 09/24/2021  6:32 PM EDT ----- Regarding: P1 Please inform Ebony Scott that we did not find a clear cause for her anemia. We will need to pursue a bone marrow biopsy to determine the source. An order has been placed with IR. She should be receiving a call from them soon.   ----- Message ----- From: Buel Ream, [facility] In [place] Sent: 09/19/2021  10:28 AM EDT To: Orson Slick, MD

## 2021-09-27 DIAGNOSIS — J479 Bronchiectasis, uncomplicated: Secondary | ICD-10-CM | POA: Diagnosis not present

## 2021-09-27 DIAGNOSIS — R059 Cough, unspecified: Secondary | ICD-10-CM | POA: Diagnosis not present

## 2021-09-27 DIAGNOSIS — J398 Other specified diseases of upper respiratory tract: Secondary | ICD-10-CM | POA: Diagnosis not present

## 2021-09-27 DIAGNOSIS — J45901 Unspecified asthma with (acute) exacerbation: Secondary | ICD-10-CM | POA: Diagnosis not present

## 2021-09-29 ENCOUNTER — Telehealth: Payer: Self-pay | Admitting: Cardiology

## 2021-09-29 NOTE — Telephone Encounter (Signed)
Pt c/o BP issue: STAT if pt c/o blurred vision, one-sided weakness or slurred speech  1. What are your last 5 BP readings?  92/40 92/42 116/50 146/58 this morning when she got up   2. Are you having any other symptoms (ex. Dizziness, headache, blurred vision, passed out)? No symtpoms  3. What is your BP issue? Patient states she is in the middle of an infusion.

## 2021-09-29 NOTE — Telephone Encounter (Signed)
Returned call to patient to discuss the following,   Pt c/o BP issue: STAT if pt c/o blurred vision, one-sided weakness or slurred speech   1. What are your last 5 BP readings?  92/40 92/42 116/50 146/58 this morning when she got up     2. Are you having any other symptoms (ex. Dizziness, headache, blurred vision, passed out)? No symtpoms   3. What is your BP issue? Patient states she is in the middle of an infusion.    Patient states that she is currently having an infusion and her Blood pressure dropped to 92/40. Is now 121/73 after drinking some water. Patient states that she has had the infusion and her blood pressure routinely drops low during the infusion. Patient asked that information be routed for review.

## 2021-10-03 ENCOUNTER — Telehealth: Payer: Self-pay

## 2021-10-03 NOTE — Telephone Encounter (Signed)
Pt called in concerned and wanting to ask if she should get the vaccination for RSV? Pt states with her health conditions she didn't want to be at risk for getting this. Please advise.  Cb#: [phone]

## 2021-10-06 ENCOUNTER — Encounter: Payer: Medicare PPO | Admitting: Podiatry

## 2021-10-11 ENCOUNTER — Encounter: Payer: Medicare PPO | Admitting: Podiatry

## 2021-10-18 ENCOUNTER — Other Ambulatory Visit: Payer: Self-pay | Admitting: Physician Assistant

## 2021-10-18 DIAGNOSIS — D509 Iron deficiency anemia, unspecified: Secondary | ICD-10-CM

## 2021-10-19 ENCOUNTER — Ambulatory Visit (HOSPITAL_COMMUNITY)
Admission: RE | Admit: 2021-10-19 | Discharge: 2021-10-19 | Disposition: A | Discharge status: Home / Self Care | Payer: Medicare PPO | Source: Ambulatory Visit | Attending: Hematology and Oncology | Admitting: Hematology and Oncology

## 2021-10-19 ENCOUNTER — Encounter (HOSPITAL_COMMUNITY): Payer: Self-pay

## 2021-10-19 ENCOUNTER — Other Ambulatory Visit: Payer: Self-pay

## 2021-10-19 ENCOUNTER — Other Ambulatory Visit (HOSPITAL_COMMUNITY): Payer: Self-pay

## 2021-10-19 DIAGNOSIS — D509 Iron deficiency anemia, unspecified: Secondary | ICD-10-CM | POA: Diagnosis not present

## 2021-10-19 DIAGNOSIS — D72821 Monocytosis (symptomatic): Secondary | ICD-10-CM | POA: Diagnosis not present

## 2021-10-19 DIAGNOSIS — D649 Anemia, unspecified: Secondary | ICD-10-CM | POA: Diagnosis not present

## 2021-10-19 DIAGNOSIS — D539 Nutritional anemia, unspecified: Secondary | ICD-10-CM | POA: Insufficient documentation

## 2021-10-19 DIAGNOSIS — Z1379 Encounter for other screening for genetic and chromosomal anomalies: Secondary | ICD-10-CM | POA: Insufficient documentation

## 2021-10-19 LAB — CBC WITH DIFFERENTIAL/PLATELET
Abs Immature Granulocytes: 0.03 10*3/uL (ref 0.00–0.07)
Basophils Absolute: 0.1 10*3/uL (ref 0.0–0.1)
Basophils Relative: 1 %
Eosinophils Absolute: 0.5 10*3/uL (ref 0.0–0.5)
Eosinophils Relative: 6 %
HCT: 32.2 % — ABNORMAL LOW (ref 36.0–46.0)
Hemoglobin: 10.2 g/dL — ABNORMAL LOW (ref 12.0–15.0)
Immature Granulocytes: 0 %
Lymphocytes Relative: 24 %
Lymphs Abs: 1.9 10*3/uL (ref 0.7–4.0)
MCH: 35.2 pg — ABNORMAL HIGH (ref 26.0–34.0)
MCHC: 31.7 g/dL (ref 30.0–36.0)
MCV: 111 fL — ABNORMAL HIGH (ref 80.0–100.0)
Monocytes Absolute: 1.3 10*3/uL — ABNORMAL HIGH (ref 0.1–1.0)
Monocytes Relative: 16 %
Neutro Abs: 4.3 10*3/uL (ref 1.7–7.7)
Neutrophils Relative %: 53 %
Platelets: 261 10*3/uL (ref 150–400)
RBC: 2.9 MIL/uL — ABNORMAL LOW (ref 3.87–5.11)
RDW: 16.7 % — ABNORMAL HIGH (ref 11.5–15.5)
WBC: 8 10*3/uL (ref 4.0–10.5)
nRBC: 0.2 % (ref 0.0–0.2)

## 2021-10-19 MED ORDER — FENTANYL CITRATE (PF) 100 MCG/2ML IJ SOLN
INTRAMUSCULAR | Status: AC
  Filled 2021-10-19: qty 2

## 2021-10-19 MED ORDER — LIDOCAINE HCL (PF) 1 % IJ SOLN
INTRAMUSCULAR | Status: AC | PRN
  Administered 2021-10-19: 30 mL

## 2021-10-19 MED ORDER — SODIUM CHLORIDE 0.9 % IV SOLN: INTRAVENOUS | Status: DC

## 2021-10-19 MED ORDER — FENTANYL CITRATE (PF) 100 MCG/2ML IJ SOLN
INTRAMUSCULAR | Status: AC | PRN
  Administered 2021-10-19 (×2): 50 ug via INTRAVENOUS

## 2021-10-19 MED ORDER — MIDAZOLAM HCL 2 MG/2ML IJ SOLN
INTRAMUSCULAR | Status: AC | PRN
  Administered 2021-10-19 (×2): 1 mg via INTRAVENOUS

## 2021-10-19 MED ORDER — MIDAZOLAM HCL 2 MG/2ML IJ SOLN
INTRAMUSCULAR | Status: AC
  Filled 2021-10-19: qty 4

## 2021-10-19 NOTE — Procedures (Signed)
Interventional Radiology Procedure:   Indications:  Macrocytic anemia of uncertain etiology  Procedure: CT guided bone marrow biopsy  Findings: 2 aspirates and 1 core from right ilium  Complications: None     EBL: Minimal, less than 10 ml  Plan: Discharge to home in one hour.   Reighlyn Elmes R. Anselm Pancoast, MD  Pager: [phone]

## 2021-10-19 NOTE — Discharge Instructions (Signed)
Please call Interventional Radiology clinic [phone] with any questions or concerns.  You may remove your dressing and shower tomorrow.   Bone Marrow Aspiration and Bone Marrow Biopsy, Adult, Care After This sheet gives you information about how to care for yourself after your procedure. Your health care provider may also give you more specific instructions. If you have problems or questions, contact your health care provider. What can I expect after the procedure? After the procedure, it is common to have: Mild pain and tenderness. Swelling. Bruising. Follow these instructions at home: Puncture site care Follow instructions from your health care provider about how to take care of the puncture site. Make sure you: Wash your hands with soap and water before and after you change your bandage (dressing). If soap and water are not available, use hand sanitizer. Change your dressing as told by your health care provider. Check your puncture site every day for signs of infection. Check for: More redness, swelling, or pain. Fluid or blood. Warmth. Pus or a bad smell.   Activity Return to your normal activities as told by your health care provider. Ask your health care provider what activities are safe for you. Do not lift anything that is heavier than 10 lb (4.5 kg), or the limit that you are told, until your health care provider says that it is safe. Do not drive for 24 hours if you were given a sedative during your procedure. General instructions Take over-the-counter and prescription medicines only as told by your health care provider. Do not take baths, swim, or use a hot tub until your health care provider approves. Ask your health care provider if you may take showers. You may only be allowed to take sponge baths. If directed, put ice on the affected area. To do this: Put ice in a plastic bag. Place a towel between your skin and the bag. Leave the ice on for 20 minutes, 2-3 times a  day. Keep all follow-up visits as told by your health care provider. This is important.   Contact a health care provider if: Your pain is not controlled with medicine. You have a fever. You have more redness, swelling, or pain around the puncture site. You have fluid or blood coming from the puncture site. Your puncture site feels warm to the touch. You have pus or a bad smell coming from the puncture site. Summary After the procedure, it is common to have mild pain, tenderness, swelling, and bruising. Follow instructions from your health care provider about how to take care of the puncture site and what activities are safe for you. Take over-the-counter and prescription medicines only as told by your health care provider. Contact a health care provider if you have any signs of infection, such as fluid or blood coming from the puncture site. This information is not intended to replace advice given to you by your health care provider. Make sure you discuss any questions you have with your health care provider. Document Revised: 06/24/2018 Document Reviewed: 06/24/2018 Elsevier Patient Education  2021 [place].   Moderate Conscious Sedation, Adult, Care After This sheet gives you information about how to care for yourself after your procedure. Your health care provider may also give you more specific instructions. If you have problems or questions, contact your health care provider. What can I expect after the procedure? After the procedure, it is common to have: Sleepiness for several hours. Impaired judgment for several hours. Difficulty with balance. Vomiting if you eat too  soon. Follow these instructions at home: For the time period you were told by your health care provider: Rest. Do not participate in activities where you could fall or become injured. Do not drive or use machinery. Do not drink alcohol. Do not take sleeping pills or medicines that cause drowsiness. Do not  make important decisions or sign legal documents. Do not take care of children on your own.      Eating and drinking Follow the diet recommended by your health care provider. Drink enough fluid to keep your urine pale yellow. If you vomit: Drink water, juice, or soup when you can drink without vomiting. Make sure you have little or no nausea before eating solid foods.   General instructions Take over-the-counter and prescription medicines only as told by your health care provider. Have a responsible adult stay with you for the time you are told. It is important to have someone help care for you until you are awake and alert. Do not smoke. Keep all follow-up visits as told by your health care provider. This is important. Contact a health care provider if: You are still sleepy or having trouble with balance after 24 hours. You feel light-headed. You keep feeling nauseous or you keep vomiting. You develop a rash. You have a fever. You have redness or swelling around the IV site. Get help right away if: You have trouble breathing. You have new-onset confusion at home. Summary After the procedure, it is common to feel sleepy, have impaired judgment, or feel nauseous if you eat too soon. Rest after you get home. Know the things you should not do after the procedure. Follow the diet recommended by your health care provider and drink enough fluid to keep your urine pale yellow. Get help right away if you have trouble breathing or new-onset confusion at home. This information is not intended to replace advice given to you by your health care provider. Make sure you discuss any questions you have with your health care provider. Document Revised: 06/05/2019 Document Reviewed: 01/01/2019 Elsevier Patient Education  2021 [employer]  

## 2021-10-19 NOTE — Consult Note (Signed)
Chief Complaint: Patient was seen in consultation today for  CT guided bone marrow biopsy  Referring Physician(s): Dorsey,John T IV  Supervising Physician: Markus Daft  Patient Status: [facility] - Out-pt  History of Present Illness: Ebony Scott is a 71 y.o. female with past medical history of right lower extremity DVT, arthritis, asthma, left breast cancer in remission, CHF, coronary artery disease with stenting, depression, diverticulosis, GERD, fibromyalgia, Graves' disease, hypothyroidism, hypercholesterolemia, IBS, osteoporosis, peripheral vascular disease who presents now with macrocytic anemia of uncertain etiology.  She is scheduled today for CT-guided bone marrow biopsy for further evaluation.  Past Medical History:  Diagnosis Date   Abscess of dorsum of right hand 09/22/2020   Acute deep vein thrombosis (DVT) of right lower extremity ([place]) 12/13/2017   Allergic rhinitis    Anemia    Angio-edema    Anxiety    pt denies dx   Arthritis    Phreesia 10/21/2019   Asthma    Phreesia 10/21/2019   Breast cancer ([place]) 1998   Left breast, in remission   Cataract    REMOVED   CHF (congestive heart failure) ([place])    Clostridium difficile colitis 91/69/4503   Complication of anesthesia    "had hard time waking up from it several times" (02/20/2012), no anesthesia problems since 2014   Coronary artery disease    Depression    "some; don't take anything for it" (02/20/2012), pt denies dx as of 06/02/21   Diverticulosis    DVT (deep venous thrombosis) ([place])    Dyspnea    all the time   Fibromyalgia 11/2011   GERD (gastroesophageal reflux disease)    Graves disease    Headache(784.0)    "related to allergies; more at different times during the year" (02/20/2012)   Hemorrhoids    Hiatal hernia    back and neck   History of blood transfusion 2023   1 unit, 5 units of Iron   Hx of adenomatous colonic polyps 04/12/2016   Hypercholesteremia    good cholesterol is high    Hypothyroidism    IBS (irritable bowel syndrome)    Moderate persistent asthma    -FeV1 72% 2011, -IgE 102 2011, CT sinus Neg 2011   Osteomyelitis of second toe of right foot ([place]) 09/23/2020   Osteoporosis    on reclast yearly   Peripheral vascular disease ([place]) 2019   DVTs (3) lungs and leg   Pneumonia 04/2011; ~ 11/2011   "double; single" (02/20/2012)   Septic olecranon bursitis of right elbow 09/22/2020   Seronegative rheumatoid arthritis ([place])    Dr. Lahoma Rocker   SIRS (systemic inflammatory response syndrome) ([place]) 02/10/2018   Sleep apnea    uses cpap nightly   Tracheobronchomalacia     Past Surgical History:  Procedure Laterality Date   ABDOMINAL HYSTERECTOMY N/A    Phreesia 10/21/2019   ANTERIOR AND POSTERIOR REPAIR  1990's   APPENDECTOMY     ARGON LASER APPLICATION  88/82/8003   Procedure: ARGON LASER APPLICATION;  Surgeon: Yetta Flock, MD;  Location: [facility];  Service: Gastroenterology;;   BREAST LUMPECTOMY  1998   left   BREAST SURGERY N/A    Phreesia 10/21/2019   BRONCHIAL WASHINGS  04/05/2020   Procedure: BRONCHIAL WASHINGS;  Surgeon: Freddi Starr, MD;  Location: [facility];  Service: Pulmonary;;   CAPSULOTOMY Right 08/28/2021   Procedure: CAPSULOTOMY;  Surgeon: Felipa Furnace, DPM;  Location: [facility];  Service: Podiatry;  Laterality: Right;  CARPOMETACARPEL ([place]) FUSION OF THUMB WITH AUTOGRAFT FROM RADIUS  ~ 2009   "both thumbs" (02/20/2012)   CATARACT EXTRACTION W/ INTRAOCULAR LENS  IMPLANT, BILATERAL  2012   CERVICAL DISCECTOMY  10/2001   C5-C6   CERVICAL FUSION  2003   C3-C4   CHOLECYSTECTOMY     COLONOSCOPY     CORONARY STENT INTERVENTION N/A 01/11/2021   Procedure: CORONARY STENT INTERVENTION;  Surgeon: Leonie Man, MD;  Location: [facility];  Service: Cardiovascular;  Laterality: N/A;   DEBRIDEMENT TENNIS ELBOW  ?1970's   right   ESOPHAGOGASTRODUODENOSCOPY     ESOPHAGOGASTRODUODENOSCOPY (EGD) WITH PROPOFOL N/A  02/05/2021   Procedure: ESOPHAGOGASTRODUODENOSCOPY (EGD) WITH PROPOFOL;  Surgeon: Yetta Flock, MD;  [facility];  Service: Gastroenterology;  Laterality: N/A;   EYE SURGERY N/A    Phreesia 10/21/2019   HAMMER TOE SURGERY Right 08/28/2021   Procedure: HAMMER TOE REPAIR 2ND TOE RIGHT FOOT;  Surgeon: Felipa Furnace, DPM;  Location: [facility];  Service: Podiatry;  Laterality: Right;   HYSTERECTOMY     I & D EXTREMITY Right 09/22/2020   Procedure: IRRIGATION AND DEBRIDEMENT RIGHT HAND AND ELBOW;  Surgeon: Marchia Bond, MD;  [facility];  Service: Orthopedics;  Laterality: Right;   KNEE ARTHROPLASTY  ?1990's   "?right; w/cartilage repair" (02/20/2012)   MASS EXCISION Left 08/28/2021   Procedure: EXCISION OF SOFT TISSUE  MASS LEFT FOOT;  Surgeon: Felipa Furnace, DPM;  Location: [facility];  Service: Podiatry;  Laterality: Left;   NASAL SEPTUM SURGERY  1980's   POSTERIOR CERVICAL FUSION/FORAMINOTOMY  2004   "failed initial fusion; rewired  anterior neck" (02/20/2012)   REVERSE SHOULDER ARTHROPLASTY Right 02/16/2020   Procedure: REVERSE SHOULDER ARTHROPLASTY;  Surgeon: Marchia Bond, MD;  [facility];  Service: Orthopedics;  Laterality: Right;   RIGHT/LEFT HEART CATH AND CORONARY ANGIOGRAPHY N/A 01/11/2021   Procedure: RIGHT/LEFT HEART CATH AND CORONARY ANGIOGRAPHY;  Surgeon: Leonie Man, MD;  Location: [facility];  Service: Cardiovascular;  Laterality: N/A;   SPINE SURGERY N/A    Phreesia 10/21/2019   TONSILLECTOMY  ~ 1953   VESICOVAGINAL FISTULA CLOSURE W/ TAH  1988   VIDEO BRONCHOSCOPY Bilateral 08/23/2016   Procedure: VIDEO BRONCHOSCOPY WITH FLUORO;  Surgeon: Javier Glazier, MD;  [facility];  Service: Cardiopulmonary;  Laterality: Bilateral;   VIDEO BRONCHOSCOPY N/A 04/05/2020   Procedure: VIDEO BRONCHOSCOPY WITHOUT FLUORO;  Surgeon: Freddi Starr, MD;  [facility];  Service: Pulmonary;  Laterality: N/A;    Allergies: Baclofen, Dust  mite extract, Molds & smuts, Morphine and related, Other, Penicillins, Rofecoxib, Shellfish allergy, Tetracycline hcl, Xolair [omalizumab], Zoledronic acid, Dilaudid [hydromorphone hcl], Levofloxacin, Oxycodone hcl, Paroxetine, Celecoxib, Diltiazem hcl, Hydrocodone-acetaminophen, Lactose intolerance (gi), Rituximab, Tree extract, Gamunex [immune globulin], and Penicillin g procaine  Medications: Prior to Admission medications   Medication Sig Start Date End Date Taking? Authorizing Provider  acetaminophen (TYLENOL) 650 MG CR tablet Take 1,300 mg by mouth every 8 (eight) hours as needed for pain.   Yes [provider]  albuterol (VENTOLIN HFA) 108 (90 Base) MCG/ACT inhaler Inhale 2 puffs into the lungs every 6 (six) hours as needed for wheezing or shortness of breath. 03/01/21  Yes Kozlow, Donnamarie Poag, MD  allopurinol (ZYLOPRIM) 100 MG tablet Take 1 tablet (100 mg total) by mouth daily. 02/09/21  Yes [name], MD  Alpha-D-Galactosidase (BEANO PO) Take 2 tablets by mouth 3 (three) times daily with meals.   Yes  [provider]  Alpha-Lipoic Acid 600 MG TABS Take 600 mg by mouth daily.   Yes [provider]  aspirin EC 81 MG tablet Take 81 mg by mouth daily. Swallow whole. Taking ASA while patient is off Plavix.  When patient starts back on Plavix, she will stop ASA per MD.   Yes [provider]  atorvastatin (LIPITOR) 80 MG tablet Take 1 tablet (80 mg total) by mouth daily. 02/09/21  Yes [name], MD  azelastine (ASTELIN) 0.1 % nasal spray Place 2 sprays into both nostrils daily. Use in each nostril as directed Patient taking differently: Place 2 sprays into both nostrils at bedtime as needed for rhinitis. Use in each nostril as directed 06/13/21  Yes Kozlow, Donnamarie Poag, MD  benzonatate (TESSALON) 200 MG capsule TAKE 1 CAPSULE BY MOUTH THREE TIMES DAILY AS NEEDED FOR COUGH 07/13/21  Yes [name], MD  Calcium-Magnesium-Vitamin D (CALCIUM MAGNESIUM PO)  Take 1 tablet by mouth daily.   Yes [provider]  cetirizine (ZYRTEC) 10 MG tablet Take 1 tablet (10 mg total) by mouth 2 (two) times daily as needed for allergies (Can take a n extra dose during flare ups.). Patient taking differently: Take 10 mg by mouth every evening. 06/13/21  Yes Kozlow, Donnamarie Poag, MD  cholecalciferol (VITAMIN D) 25 MCG (1000 UNIT) tablet Take 1,000 Units by mouth every evening.   Yes [provider]  clopidogrel (PLAVIX) 75 MG tablet Take 1 tablet (75 mg total) by mouth daily. 06/02/21  Yes Loel Dubonnet, NP  dexlansoprazole (DEXILANT) 60 MG capsule Take 1 capsule (60 mg total) by mouth daily. 06/13/21  Yes Kozlow, Donnamarie Poag, MD  diclofenac Sodium (VOLTAREN) 1 % GEL APPLY 2 GRAMS TOPICALLY 4 TIMES DAILY Patient taking differently: Apply 1 Application topically 4 (four) times daily as needed (pain). 06/12/21  Yes [name], MD  empagliflozin (JARDIANCE) 10 MG TABS tablet Take 1 tablet (10 mg total) by mouth daily before breakfast. 12/05/20  Yes [name], MD  famotidine (PEPCID) 20 MG tablet Take 1 tablet (20 mg total) by mouth at bedtime. 06/13/21  Yes Kozlow, Donnamarie Poag, MD  fluticasone (FLONASE) 50 MCG/ACT nasal spray USE 2 SPRAYS IN EACH NOSTRIL DAILY Patient taking differently: Place 2 sprays into both nostrils daily as needed for allergies. 09/02/20  Yes Freddi Starr, MD  folic acid (FOLVITE) 1 MG tablet Take 1 tablet (1 mg total) by mouth daily. 02/09/21  Yes [name], MD  furosemide (LASIX) 40 MG tablet TAKE 1 TABLET BY MOUTH TWICE DAILY FOR 3 DAYS, THEN BACK TO ONCE DAILY Patient taking differently: Take 40 mg by mouth daily. 08/16/21  Yes Loel Dubonnet, NP  gabapentin (NEURONTIN) 100 MG capsule Take 1 capsule (100 mg total) by mouth 3 (three) times daily. 09/05/21  Yes Felipa Furnace, DPM  guaiFENesin (MUCINEX) 600 MG 12 hr tablet Take 1 tablet (600 mg total) by mouth 2 (two) times daily as needed for cough or to loosen  phlegm. Patient taking differently: Take 600 mg by mouth 2 (two) times daily. 05/09/20  Yes [name], MD  HIZENTRA 1 GM/5ML SOLN Hizentra 3 times/5 ml = 65 ml total once weekly on Fridays 08/09/21  Yes [provider]  isosorbide-hydrALAZINE (BIDIL) 20-37.5 MG tablet Take 1 tablet by mouth every evening. 05/12/21  Yes [provider]  Lactase (LACTOSE FAST ACTING RELIEF PO) Take 3 tablets by mouth 3 (three) times daily.  Yes [provider]  lidocaine-prilocaine (EMLA) cream Apply 1 application  topically as needed (pain). 04/18/21  Yes [provider]  magnesium oxide (MAG-OX) 400 (240 Mg) MG tablet Take 400 mg by mouth daily.   Yes [provider]  Menthol, Topical Analgesic, (BIOFREEZE) 4 % GEL Apply 1 application. topically daily as needed (pain).   Yes [provider]  mirtazapine (REMERON SOL-TAB) 30 MG disintegrating tablet Take 30 mg by mouth at bedtime as needed (sleep).   Yes [provider]  montelukast (SINGULAIR) 10 MG tablet Take 1 tablet (10 mg total) by mouth daily. Patient taking differently: Take 10 mg by mouth every evening. 03/01/21  Yes Kozlow, Donnamarie Poag, MD  Nystatin (GERHARDT'S BUTT CREAM) CREA Apply topically 3 times a day as needed for itching or irritation 12/30/20  Yes [name], MD  nystatin (MYCOSTATIN) 100000 UNIT/ML suspension Use as directed 15 mLs in the mouth or throat at bedtime as needed (mouth sores).   Yes [provider]  nystatin-triamcinolone ointment (MYCOLOG) Apply 1 Application topically 2 (two) times daily as needed (vaginal irritation).   Yes [provider]  potassium chloride SA (KLOR-CON M) 20 MEQ tablet Take 20 mEq by mouth daily.   Yes [provider]  predniSONE (DELTASONE) 1 MG tablet TAKE TWO TABLET WITH BREAKFAST... TAKE WITH 10 MG FOR A TOTAL OF 12 MG DAILY Patient taking differently: Take 2 mg by mouth. Total of 7 mg daily. 08/08/21  Yes [name], MD  predniSONE (DELTASONE) 5 MG tablet Take 1 tablet (5 mg total) by mouth daily with breakfast. Along with 2 mg tablets for total daily dose 7 mg total daily 05/01/21  Yes Rice, Resa Miner, MD  Prenatal Vit-Fe Fumarate-FA (PRENATAL VITAMINS PO) Take 1 tablet by mouth daily.   Yes [provider]  Probiotic Product (ALIGN PO) Take 1 capsule by mouth daily.   Yes [provider]  Propylene Glycol (SYSTANE COMPLETE) 0.6 % SOLN Place 1 drop into both eyes 2 (two) times daily. 03/01/21  Yes Kozlow, Donnamarie Poag, MD  Tiotropium Bromide-Olodaterol (STIOLTO RESPIMAT) 2.5-2.5 MCG/ACT AERS Inhale 2 puffs into the lungs daily. Patient taking differently: Inhale 2 puffs into the lungs every evening. 08/15/21  Yes Dewald, Cheryle Horsfall, MD  traMADol (ULTRAM) 50 MG tablet TAKE 1 TABLET BY MOUTH DAILY AT BEDTIME 07/13/21  Yes [name], MD  valsartan (DIOVAN) 80 MG tablet Take 1 tablet (80 mg total) by mouth daily. 08/14/21  Yes [name], MD  XARELTO 20 MG TABS tablet TAKE 1 TABLET BY MOUTH DAILY WITH SUPPER 02/21/21  Yes [name], MD  cyproheptadine (PERIACTIN) 4 MG tablet Take 2 tablets (8 mg total) by mouth at bedtime. Take 2 tablets at bedtime as needed to treat facial pain or sleep dysfunction. Patient taking differently: Take 8 mg by mouth at bedtime. 06/13/21   Kozlow, Donnamarie Poag, MD  denosumab (PROLIA) 60 MG/ML SOSY injection Inject 60 mg into the skin every 6 (six) months.    [provider]  EPINEPHrine 0.3 mg/0.3 mL IJ SOAJ injection USE AS DIRECTED BY YOUR PHYSICIAN INTRAMUSCULARLY AS NEEDED FOR ANAPHYLAXIS 12/05/20   Kozlow, Donnamarie Poag, MD     Family History  Problem Relation Age of Onset   Allergies Mother    Heart disease Mother    Arthritis Mother    Lung cancer Mother        heavy smoker   Diabetes Mother  Allergies Father    Heart disease Father    Arthritis Father    Stroke Father    Heart disease Brother    Diabetes Maternal Grandfather     Colitis Daughter    Colon cancer Other        Maternal half uncle    Social History   Socioeconomic History   Marital status: Divorced    Spouse name: Not on file   Number of children: 2   Years of education: college   Highest education level: Not on file  Occupational History   Occupation: Disabled    Comment: Retired Engineer, production: RETIRED  Tobacco Use   Smoking status: Never    Passive exposure: Yes   Smokeless tobacco: Never   Tobacco comments:    Parents  Vaping Use   Vaping Use: Never used  Substance and Sexual Activity   Alcohol use: Not Currently    Alcohol/week: 0.0 standard drinks of alcohol   Drug use: No   Sexual activity: Not Currently    Birth control/protection: Surgical    Comment: Hysterectomy  Other Topics Concern   Not on file  Social History Narrative   Patient lives at home alone. Patient  divorced.    Patient has her BS degree.   Right handed.   Caffeine- sometimes coffee.      [facility]:   Born in [place], [place]. She worked as a Copywriter, advertising. She has no pets currently. She does have indoor plants. Previously had mold in her home that was remediated. Carpet was removed.          Social Determinants of Health   Financial Resource Strain: Low Risk  (07/07/2020)   Overall Financial Resource Strain (CARDIA)    Difficulty of Paying Living Expenses: Not hard at all  Food Insecurity: No Food Insecurity (07/13/2021)   Hunger Vital Sign    Worried About Running Out of Food in the Last Year: Never true    Ran Out of Food in the Last Year: Never true  Transportation Needs: No Transportation Needs (07/13/2021)   PRAPARE - Hydrologist (Medical): No    Lack of Transportation (Non-Medical): No  Physical Activity: Insufficiently Active (07/13/2021)   Exercise Vital Sign    Days of Exercise per Week: 5 days    Minutes of Exercise per Session: 20 min  Stress: No Stress Concern Present  (07/13/2021)   [place]    Feeling of Stress : Only a little  Social Connections: Moderately Isolated (07/13/2021)   Social Connection and Isolation Panel [NHANES]    Frequency of Communication with Friends and Family: More than three times a week    Frequency of Social Gatherings with Friends and Family: More than three times a week    Attends Religious Services: 1 to 4 times per year    Active Member of [employer] or Organizations: No    Attends Archivist Meetings: Never    Marital Status: Divorced      Review of Systems currently denies fever, headache, chest pain, abdominal/back pain, nausea, vomiting or bleeding.  She does have chronic dyspnea and occasional cough.  Vital Signs: BP (!) 116/51   Pulse 69   Temp 98.4 F (36.9 C) (Oral)   Resp 18   Ht _0  (1.549 m)   Wt 130 lb (59 kg)   SpO2 100%   BMI 24.56  kg/m      Physical Exam awake, alert.  Chest clear to auscultation bilaterally.  Heart with regular rate and rhythm.  Abdomen soft, positive bowel sounds, nontender.  No significant lower extremity edema.  Imaging: No results found.  Labs:  CBC: Recent Labs    05/24/21 1459 08/14/21 1156 09/19/21 1018 10/19/21 0739  WBC 12.3* 11.1* 11.0* 8.0  HGB 11.9* 10.7* 9.1* 10.2*  HCT 37.1 31.8* 28.8* 32.2*  PLT 196 164 219 261    COAGS: Recent Labs    02/04/21 1150  INR 1.1    BMP: Recent Labs    02/07/21 0531 02/24/21 1022 03/14/21 1215 05/03/21 1027 05/24/21 1459 08/14/21 1156 09/19/21 1018  NA 139   < > 141 141 140 141 140  K 3.6   < > 4.2 4.4 4.4 4.4 4.4  CL 102   < > 103 105 104 105 106  CO2 27   < > _0 GLUCOSE 104*   < > 142* 128* 196* 218* 177*  BUN 27*   < > 33* 37* 33* 38* 35*  CALCIUM 8.2*   < > 9.6 9.3 9.3 8.9 8.8*  CREATININE 1.30*   < > 1.37* 1.37* 1.44* 1.37* 1.72*  GFRNONAA 44*  --  41*  --  38*  --  31*   < > = values in this interval not  displayed.    LIVER FUNCTION TESTS: Recent Labs    03/14/21 1215 05/03/21 1027 05/24/21 1459 08/14/21 1156 09/19/21 1018  BILITOT 0.3 0.5 0.4 0.6 0.5  AST 40 42* 70* 59* 40  ALT 61* 56* 87* 73* 38  ALKPHOS 97 60 76  --  49  PROT 7.5 6.5 7.2 6.3 7.2  ALBUMIN 3.9 3.9 3.5  --  3.8    TUMOR MARKERS: No results for input(s): "AFPTM", "CEA", "CA199", "CHROMGRNA" in the last 8760 hours.  Assessment and Plan: 71 y.o. female with past medical history of right lower extremity DVT, arthritis, asthma, left breast cancer in remission, CHF, coronary artery disease with stenting, depression, diverticulosis, GERD, fibromyalgia, Graves' disease, hypothyroidism, hypercholesterolemia, IBS, osteoporosis, peripheral vascular disease who presents now with macrocytic anemia of uncertain etiology.  She is scheduled today for CT-guided bone marrow biopsy for further evaluation.Risks and benefits of procedure was discussed with the patient including, but not limited to bleeding, infection, damage to adjacent structures or low yield requiring additional tests.  All of the questions were answered and there is agreement to proceed.  Consent signed and in chart.    Thank you for this interesting consult.  I greatly enjoyed meeting Ebony Scott and look forward to participating in their care.  A copy of this report was sent to the requesting provider on this date.  Electronically Signed: D. Rowe Robert, PA-C 10/19/2021, 8:30 AM   I spent a total of  20 minutes   in face to face in clinical consultation, greater than 50% of which was counseling/coordinating care for CT-guided bone marrow biopsy

## 2021-10-24 ENCOUNTER — Encounter (HOSPITAL_BASED_OUTPATIENT_CLINIC_OR_DEPARTMENT_OTHER): Payer: Self-pay | Admitting: Cardiology

## 2021-10-25 ENCOUNTER — Encounter (HOSPITAL_COMMUNITY): Payer: Self-pay | Admitting: Hematology and Oncology

## 2021-10-26 ENCOUNTER — Encounter (HOSPITAL_COMMUNITY): Payer: Self-pay | Admitting: Hematology and Oncology

## 2021-10-26 LAB — SURGICAL PATHOLOGY

## 2021-10-27 ENCOUNTER — Telehealth: Payer: Self-pay | Admitting: *Deleted

## 2021-10-27 ENCOUNTER — Telehealth: Payer: Self-pay | Admitting: Pulmonary Disease

## 2021-10-27 DIAGNOSIS — J4551 Severe persistent asthma with (acute) exacerbation: Secondary | ICD-10-CM

## 2021-10-27 MED ORDER — PREDNISONE 10 MG PO TABS: ORAL_TABLET | ORAL | 0 refills | Status: AC

## 2021-10-27 MED ORDER — AZITHROMYCIN 250 MG PO TABS: ORAL_TABLET | ORAL | 0 refills | Status: DC

## 2021-10-27 NOTE — Telephone Encounter (Signed)
Patient is aware of recommendations and voiced her understanding. Nothing further needed.   

## 2021-10-27 NOTE — Telephone Encounter (Signed)
Called and spoke with pt who states she has been wheezing for the past 2 days and states that even with using her vibrating vest and all meds as prescribed, states that the wheezing is not going away.  States that she is also coughing and will occasionally get up yellow phlegm. Due to this, pt wants to know what might be able to be recommended if meds could be prescribed.  Dr. Erin Fulling, please advise.

## 2021-10-27 NOTE — Telephone Encounter (Signed)
Extended steroid taper and Zpak sent to pharmacy for asthma exacerbation.  Thanks, JD

## 2021-10-27 NOTE — Telephone Encounter (Signed)
Received vm message from patient asking about the results from her bone marrow biopsy on 10/19/21  Please advise

## 2021-10-27 NOTE — Telephone Encounter (Signed)
Lm x1 for patient.  

## 2021-10-27 NOTE — Telephone Encounter (Signed)
Patient states she has had increased wheezing for the past 2 days. Patient scheduled appointment with JD on 9/26, but needs medication in the meantime. She is using vibrating vest. Uses [facility].  Please advise.

## 2021-10-28 DIAGNOSIS — R059 Cough, unspecified: Secondary | ICD-10-CM | POA: Diagnosis not present

## 2021-10-28 DIAGNOSIS — J398 Other specified diseases of upper respiratory tract: Secondary | ICD-10-CM | POA: Diagnosis not present

## 2021-10-28 DIAGNOSIS — J479 Bronchiectasis, uncomplicated: Secondary | ICD-10-CM | POA: Diagnosis not present

## 2021-10-28 DIAGNOSIS — J45901 Unspecified asthma with (acute) exacerbation: Secondary | ICD-10-CM | POA: Diagnosis not present

## 2021-10-30 DIAGNOSIS — M1712 Unilateral primary osteoarthritis, left knee: Secondary | ICD-10-CM | POA: Diagnosis not present

## 2021-10-30 DIAGNOSIS — M25512 Pain in left shoulder: Secondary | ICD-10-CM | POA: Diagnosis not present

## 2021-10-31 ENCOUNTER — Telehealth: Payer: Self-pay

## 2021-10-31 ENCOUNTER — Telehealth: Payer: Self-pay | Admitting: Hematology and Oncology

## 2021-10-31 NOTE — Telephone Encounter (Signed)
Called Ebony Scott but no overt abnormalities.  Patient has a macrocytic anemia with a hemoglobin greater than 10.  At this time would recommend observation with consideration of rehabilitation if hemoglobin is consistently less than 10.  I explained to the patient that no overt abnormalities were noted on her bone marrow biopsy but that I am concerned about myelodysplastic syndrome.  As such I would recommend continued observation and supportive care as needed.  Patient voiced understanding of the plan moving forward.  We will see her back in 3 months for labs in 6 months for clinic visit.  If her other providers noticed a drop in her blood counts or any other concerning changes we are happy to see her back sooner.  Ledell Peoples, MD Department of Hematology/Oncology [facility] Phone: [phone] Pager: [phone] Email: [email or web address]

## 2021-10-31 NOTE — Telephone Encounter (Signed)
Patient called requesting BMBX results from 10/19/21.  Routed to Provider.

## 2021-11-01 ENCOUNTER — Telehealth: Payer: Self-pay | Admitting: *Deleted

## 2021-11-01 ENCOUNTER — Telehealth: Payer: Self-pay | Admitting: Allergy and Immunology

## 2021-11-01 NOTE — Telephone Encounter (Signed)
Received vm message from patient requesting additional information about her bone marrow biopsy results. No answer but left vm message for pt to call back this afternoon to review her results again.

## 2021-11-01 NOTE — Telephone Encounter (Signed)
Please advise to continuing infusions

## 2021-11-01 NOTE — Telephone Encounter (Signed)
Patient called and said that she had a bone marrow biopsy and it came back good, and would like to know if she needs to continue with the infusion. [phone]

## 2021-11-02 ENCOUNTER — Encounter (HOSPITAL_COMMUNITY): Payer: Self-pay | Admitting: Hematology and Oncology

## 2021-11-06 DIAGNOSIS — M1712 Unilateral primary osteoarthritis, left knee: Secondary | ICD-10-CM | POA: Diagnosis not present

## 2021-11-06 NOTE — Telephone Encounter (Signed)
Informed pt of this and she will stay in infusions

## 2021-11-09 ENCOUNTER — Telehealth: Payer: Self-pay

## 2021-11-09 NOTE — Telephone Encounter (Signed)
Pt called in to let pcp know that she recently received her flu shots and her vaccination for the RSV. Pt also wanted to know about the new Covid vaccination available and if she should get this. Please advise.  Cb#: [phone]

## 2021-11-13 ENCOUNTER — Ambulatory Visit (HOSPITAL_BASED_OUTPATIENT_CLINIC_OR_DEPARTMENT_OTHER): Payer: Medicare PPO | Admitting: Cardiology

## 2021-11-13 ENCOUNTER — Encounter (HOSPITAL_BASED_OUTPATIENT_CLINIC_OR_DEPARTMENT_OTHER): Payer: Self-pay | Admitting: Cardiology

## 2021-11-13 ENCOUNTER — Other Ambulatory Visit: Payer: Self-pay | Admitting: Allergy and Immunology

## 2021-11-13 ENCOUNTER — Other Ambulatory Visit: Payer: Self-pay | Admitting: Family Medicine

## 2021-11-13 ENCOUNTER — Other Ambulatory Visit: Payer: Self-pay

## 2021-11-13 VITALS — BP 156/70 | HR 62 | Ht 61.0 in | Wt 138.6 lb

## 2021-11-13 DIAGNOSIS — E782 Mixed hyperlipidemia: Secondary | ICD-10-CM

## 2021-11-13 DIAGNOSIS — I429 Cardiomyopathy, unspecified: Secondary | ICD-10-CM | POA: Diagnosis not present

## 2021-11-13 DIAGNOSIS — I2510 Atherosclerotic heart disease of native coronary artery without angina pectoris: Secondary | ICD-10-CM

## 2021-11-13 DIAGNOSIS — I5042 Chronic combined systolic (congestive) and diastolic (congestive) heart failure: Secondary | ICD-10-CM

## 2021-11-13 DIAGNOSIS — M1712 Unilateral primary osteoarthritis, left knee: Secondary | ICD-10-CM | POA: Diagnosis not present

## 2021-11-13 DIAGNOSIS — Z955 Presence of coronary angioplasty implant and graft: Secondary | ICD-10-CM

## 2021-11-13 DIAGNOSIS — J4550 Severe persistent asthma, uncomplicated: Secondary | ICD-10-CM

## 2021-11-13 MED ORDER — DICLOFENAC SODIUM 1 % EX GEL: 1.0000 | Freq: Four times a day (QID) | CUTANEOUS | 3 refills | Status: DC | PRN

## 2021-11-13 MED ORDER — MIRTAZAPINE 30 MG PO TBDP: 30.0000 mg | ORAL_TABLET | Freq: Every evening | ORAL | 3 refills | Status: DC | PRN

## 2021-11-13 MED ORDER — FUROSEMIDE 40 MG PO TABS: 40.0000 mg | ORAL_TABLET | Freq: Every day | ORAL | 2 refills | Status: DC

## 2021-11-13 MED ORDER — TRAMADOL HCL 50 MG PO TABS: 50.0000 mg | ORAL_TABLET | Freq: Every day | ORAL | 2 refills | Status: DC

## 2021-11-13 NOTE — Progress Notes (Signed)
Cardiology Office Note:    Date:  11/13/2021   ID:  Ebony Scott, DOB 1950/03/02, MRN [id number]  PCP:  [name], MD  Cardiologist:  Buford Dresser, MD PhD  Referring MD: [name], MD   CC: follow up  History of Present Illness:    Ebony Scott is a 71 y.o. 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 Initial consult was 10/15/17 for incidental coronary artery calcification seen on CT scan.  Cardiac history: had CT chest 07/2017 which noted diffuse calcification, including aorta, aortic branch vessels, and coronary arteries. Had a normal Lexiscan in the past. No chest pain, main longstanding symptom is shortness of breath, followed by pulmonary. No history of clinical ASCVD events. History of DVT/PE. New diagnose of cardiomyopathy 08/2020.  Today:  She is accompanied by a family member. She says she feels exhausted all the time. She is looking to potentially undergo knee surgery in the near future.  She reports that she has been wheezing occasionally.   Her at home blood pressures have been around 381-017 systolic, with no extremely high or low readings. The only occasional high she's seen was around 510 systolic, while the only bigger low she's seen was around 258 systolic.  Of note, she mentions that she experiences bilateral LE edema as the day progresses.  She denies any palpitations, chest pain, or peripheral edema. No lightheadedness, headaches, syncope, orthopnea, or PND.  Past Medical History:  Diagnosis Date   Abscess of dorsum of right hand 09/22/2020   Acute deep vein thrombosis (DVT) of right lower extremity ([place]) 12/13/2017   Allergic rhinitis    Anemia    Angio-edema    Anxiety    pt denies dx   Arthritis    Phreesia 10/21/2019    Asthma    Phreesia 10/21/2019   Breast cancer ([place]) 1998   Left breast, in remission   Cataract    REMOVED   CHF (congestive heart failure) ([place])    Clostridium difficile colitis 52/77/8242   Complication of anesthesia    "had hard time waking up from it several times" (02/20/2012), no anesthesia problems since 2014   Coronary artery disease    Depression    "some; don't take anything for it" (02/20/2012), pt denies dx as of 06/02/21   Diverticulosis    DVT (deep venous thrombosis) ([place])    Dyspnea    all the time   Fibromyalgia 11/2011   GERD (gastroesophageal reflux disease)    Graves disease    Headache(784.0)    "related to allergies; more at different times during the year" (02/20/2012)   Hemorrhoids    Hiatal hernia    back and neck   History of blood transfusion 2023   1 unit, 5 units of Iron   Hx of adenomatous colonic polyps 04/12/2016   Hypercholesteremia    good cholesterol is high   Hypothyroidism    IBS (irritable bowel syndrome)    Moderate persistent asthma    -FeV1 72% 2011, -IgE 102 2011, CT sinus Neg 2011   Osteomyelitis of second toe of right foot ([place]) 09/23/2020   Osteoporosis    on reclast yearly   Peripheral vascular disease ([place]) 2019   DVTs (3) lungs and leg   Pneumonia 04/2011; ~ 11/2011   "double; single" (02/20/2012)  Septic olecranon bursitis of right elbow 09/22/2020   Seronegative rheumatoid arthritis ([place])    Dr. Lahoma Rocker   SIRS (systemic inflammatory response syndrome) ([place]) 02/10/2018   Sleep apnea    uses cpap nightly   Tracheobronchomalacia     Past Surgical History:  Procedure Laterality Date   ABDOMINAL HYSTERECTOMY N/A    Phreesia 10/21/2019   ANTERIOR AND POSTERIOR REPAIR  1990's   APPENDECTOMY     ARGON LASER APPLICATION  27/78/2423   Procedure: ARGON LASER APPLICATION;  Surgeon: Yetta Flock, MD;  [facility]: [facility];  Service: Gastroenterology;;   BREAST LUMPECTOMY  1998   left   BREAST SURGERY N/A     Phreesia 10/21/2019   BRONCHIAL WASHINGS  04/05/2020   Procedure: BRONCHIAL WASHINGS;  Surgeon: Freddi Starr, MD;  [facility]: [facility];  Service: Pulmonary;;   CAPSULOTOMY Right 08/28/2021   Procedure: CAPSULOTOMY;  Surgeon: Felipa Furnace, DPM;  Location: [facility];  Service: Podiatry;  Laterality: Right;   CARPOMETACARPEL ([place]) FUSION OF THUMB WITH AUTOGRAFT FROM RADIUS  ~ 2009   "both thumbs" (02/20/2012)   CATARACT EXTRACTION W/ INTRAOCULAR LENS  IMPLANT, BILATERAL  2012   CERVICAL DISCECTOMY  10/2001   C5-C6   CERVICAL FUSION  2003   C3-C4   CHOLECYSTECTOMY     COLONOSCOPY     CORONARY STENT INTERVENTION N/A 01/11/2021   Procedure: CORONARY STENT INTERVENTION;  Surgeon: Leonie Man, MD;  Location: [facility];  Service: Cardiovascular;  Laterality: N/A;   DEBRIDEMENT TENNIS ELBOW  ?1970's   right   ESOPHAGOGASTRODUODENOSCOPY     ESOPHAGOGASTRODUODENOSCOPY (EGD) WITH PROPOFOL N/A 02/05/2021   Procedure: ESOPHAGOGASTRODUODENOSCOPY (EGD) WITH PROPOFOL;  Surgeon: Yetta Flock, MD;  [facility];  Service: Gastroenterology;  Laterality: N/A;   EYE SURGERY N/A    Phreesia 10/21/2019   HAMMER TOE SURGERY Right 08/28/2021   Procedure: HAMMER TOE REPAIR 2ND TOE RIGHT FOOT;  Surgeon: Felipa Furnace, DPM;  Location: [facility];  Service: Podiatry;  Laterality: Right;   HYSTERECTOMY     I & D EXTREMITY Right 09/22/2020   Procedure: IRRIGATION AND DEBRIDEMENT RIGHT HAND AND ELBOW;  Surgeon: Marchia Bond, MD;  [facility];  Service: Orthopedics;  Laterality: Right;   KNEE ARTHROPLASTY  ?1990's   "?right; w/cartilage repair" (02/20/2012)   MASS EXCISION Left 08/28/2021   Procedure: EXCISION OF SOFT TISSUE  MASS LEFT FOOT;  Surgeon: Felipa Furnace, DPM;  Location: [facility];  Service: Podiatry;  Laterality: Left;   NASAL SEPTUM SURGERY  1980's   POSTERIOR CERVICAL FUSION/FORAMINOTOMY  2004   "failed initial fusion; rewired  anterior neck" (02/20/2012)   REVERSE SHOULDER  ARTHROPLASTY Right 02/16/2020   Procedure: REVERSE SHOULDER ARTHROPLASTY;  Surgeon: Marchia Bond, MD;  [facility];  Service: Orthopedics;  Laterality: Right;   RIGHT/LEFT HEART CATH AND CORONARY ANGIOGRAPHY N/A 01/11/2021   Procedure: RIGHT/LEFT HEART CATH AND CORONARY ANGIOGRAPHY;  Surgeon: Leonie Man, MD;  Location: [facility];  Service: Cardiovascular;  Laterality: N/A;   SPINE SURGERY N/A    Phreesia 10/21/2019   TONSILLECTOMY  ~ 1953   VESICOVAGINAL FISTULA CLOSURE W/ TAH  1988   VIDEO BRONCHOSCOPY Bilateral 08/23/2016   Procedure: VIDEO BRONCHOSCOPY WITH FLUORO;  Surgeon: Javier Glazier, MD;  [facility];  Service: Cardiopulmonary;  Laterality: Bilateral;   VIDEO BRONCHOSCOPY N/A 04/05/2020   Procedure: VIDEO BRONCHOSCOPY WITHOUT FLUORO;  Surgeon: Freddi Starr, MD;  [facility];  Service: Pulmonary;  Laterality: N/A;    Current Medications: Current Outpatient Medications on File Prior to Visit  Medication Sig   acetaminophen (TYLENOL) 650 MG CR tablet Take 1,300 mg by mouth every 8 (eight) hours as needed for pain.   albuterol (VENTOLIN HFA) 108 (90 Base) MCG/ACT inhaler Inhale 2 puffs into the lungs every 6 (six) hours as needed for wheezing or shortness of breath.   allopurinol (ZYLOPRIM) 100 MG tablet Take 1 tablet (100 mg total) by mouth daily.   Alpha-D-Galactosidase (BEANO PO) Take 2 tablets by mouth 3 (three) times daily with meals.   Alpha-Lipoic Acid 600 MG TABS Take 600 mg by mouth daily.   aspirin EC 81 MG tablet Take 81 mg by mouth daily. Swallow whole. Taking ASA while patient is off Plavix.  When patient starts back on Plavix, she will stop ASA per MD.   atorvastatin (LIPITOR) 80 MG tablet Take 1 tablet (80 mg total) by mouth daily.   azelastine (ASTELIN) 0.1 % nasal spray Place 2 sprays into both nostrils daily. Use in each nostril as directed (Patient taking differently: Place 2 sprays into both nostrils at bedtime as  needed for rhinitis. Use in each nostril as directed)   azithromycin (ZITHROMAX) 250 MG tablet Take as directed   benzonatate (TESSALON) 200 MG capsule TAKE 1 CAPSULE BY MOUTH THREE TIMES DAILY AS NEEDED FOR COUGH   Calcium-Magnesium-Vitamin D (CALCIUM MAGNESIUM PO) Take 1 tablet by mouth daily.   cetirizine (ZYRTEC) 10 MG tablet Take 1 tablet (10 mg total) by mouth 2 (two) times daily as needed for allergies (Can take a n extra dose during flare ups.). (Patient taking differently: Take 10 mg by mouth every evening.)   cholecalciferol (VITAMIN D) 25 MCG (1000 UNIT) tablet Take 1,000 Units by mouth every evening.   clopidogrel (PLAVIX) 75 MG tablet Take 1 tablet (75 mg total) by mouth daily.   cyproheptadine (PERIACTIN) 4 MG tablet Take 2 tablets (8 mg total) by mouth at bedtime. Take 2 tablets at bedtime as needed to treat facial pain or sleep dysfunction. (Patient taking differently: Take 8 mg by mouth at bedtime.)   denosumab (PROLIA) 60 MG/ML SOSY injection Inject 60 mg into the skin every 6 (six) months.   dexlansoprazole (DEXILANT) 60 MG capsule Take 1 capsule (60 mg total) by mouth daily.   diclofenac Sodium (VOLTAREN) 1 % GEL APPLY 2 GRAMS TOPICALLY 4 TIMES DAILY (Patient taking differently: Apply 1 Application topically 4 (four) times daily as needed (pain).)   empagliflozin (JARDIANCE) 10 MG TABS tablet Take 1 tablet (10 mg total) by mouth daily before breakfast.   EPINEPHrine 0.3 mg/0.3 mL IJ SOAJ injection USE AS DIRECTED BY YOUR PHYSICIAN INTRAMUSCULARLY AS NEEDED FOR ANAPHYLAXIS   famotidine (PEPCID) 20 MG tablet Take 1 tablet (20 mg total) by mouth at bedtime.   fluticasone (FLONASE) 50 MCG/ACT nasal spray USE 2 SPRAYS IN EACH NOSTRIL DAILY (Patient taking differently: Place 2 sprays into both nostrils daily as needed for allergies.)   folic acid (FOLVITE) 1 MG tablet Take 1 tablet (1 mg total) by mouth daily.   furosemide (LASIX) 40 MG tablet TAKE 1 TABLET BY MOUTH TWICE DAILY FOR 3  DAYS, THEN BACK TO ONCE DAILY (Patient taking differently: Take 40 mg by mouth daily.)   gabapentin (NEURONTIN) 100 MG capsule Take 1 capsule (100 mg total) by mouth 3 (three) times daily.   guaiFENesin (MUCINEX) 600 MG 12 hr tablet Take 1 tablet (600 mg total) by mouth  2 (two) times daily as needed for cough or to loosen phlegm. (Patient taking differently: Take 600 mg by mouth 2 (two) times daily.)   HIZENTRA 1 GM/5ML SOLN Hizentra 3 times/5 ml = 65 ml total once weekly on Fridays   isosorbide-hydrALAZINE (BIDIL) 20-37.5 MG tablet Take 1 tablet by mouth every evening.   Lactase (LACTOSE FAST ACTING RELIEF PO) Take 3 tablets by mouth 3 (three) times daily.   lidocaine-prilocaine (EMLA) cream Apply 1 application  topically as needed (pain).   magnesium oxide (MAG-OX) 400 (240 Mg) MG tablet Take 400 mg by mouth daily.   Menthol, Topical Analgesic, (BIOFREEZE) 4 % GEL Apply 1 application. topically daily as needed (pain).   mirtazapine (REMERON SOL-TAB) 30 MG disintegrating tablet Take 30 mg by mouth at bedtime as needed (sleep).   montelukast (SINGULAIR) 10 MG tablet Take 1 tablet (10 mg total) by mouth daily. (Patient taking differently: Take 10 mg by mouth every evening.)   Nystatin (GERHARDT'S BUTT CREAM) CREA Apply topically 3 times a day as needed for itching or irritation   nystatin (MYCOSTATIN) 100000 UNIT/ML suspension Use as directed 15 mLs in the mouth or throat at bedtime as needed (mouth sores).   nystatin-triamcinolone ointment (MYCOLOG) Apply 1 Application topically 2 (two) times daily as needed (vaginal irritation).   potassium chloride SA (KLOR-CON M) 20 MEQ tablet Take 20 mEq by mouth daily.   predniSONE (DELTASONE) 1 MG tablet TAKE TWO TABLET WITH BREAKFAST... TAKE WITH 10 MG FOR A TOTAL OF 12 MG DAILY (Patient taking differently: Take 2 mg by mouth. Total of 7 mg daily.)   predniSONE (DELTASONE) 5 MG tablet Take 1 tablet (5 mg total) by mouth daily with breakfast. Along with 2 mg  tablets for total daily dose 7 mg total daily   Prenatal Vit-Fe Fumarate-FA (PRENATAL VITAMINS PO) Take 1 tablet by mouth daily.   Probiotic Product (ALIGN PO) Take 1 capsule by mouth daily.   Propylene Glycol (SYSTANE COMPLETE) 0.6 % SOLN Place 1 drop into both eyes 2 (two) times daily.   Tiotropium Bromide-Olodaterol (STIOLTO RESPIMAT) 2.5-2.5 MCG/ACT AERS Inhale 2 puffs into the lungs daily. (Patient taking differently: Inhale 2 puffs into the lungs every evening.)   traMADol (ULTRAM) 50 MG tablet TAKE 1 TABLET BY MOUTH DAILY AT BEDTIME   valsartan (DIOVAN) 80 MG tablet Take 1 tablet (80 mg total) by mouth daily.   XARELTO 20 MG TABS tablet TAKE 1 TABLET BY MOUTH DAILY WITH SUPPER   Current Facility-Administered Medications on File Prior to Visit  Medication   iohexol (OMNIPAQUE) 350 MG/ML injection     Allergies:   Baclofen, Dust mite extract, Molds & smuts, Morphine and related, Other, Penicillins, Rofecoxib, Shellfish allergy, Tetracycline hcl, Xolair [omalizumab], Zoledronic acid, Dilaudid [hydromorphone hcl], Levofloxacin, Oxycodone hcl, Paroxetine, Celecoxib, Diltiazem hcl, Hydrocodone-acetaminophen, Lactose intolerance (gi), Rituximab, Tree extract, Gamunex [immune globulin], and Penicillin g procaine   Social History   Tobacco Use   Smoking status: Never    Passive exposure: Yes   Smokeless tobacco: Never   Tobacco comments:    Parents  Vaping Use   Vaping Use: Never used  Substance Use Topics   Alcohol use: Not Currently    Alcohol/week: 0.0 standard drinks of alcohol   Drug use: No    Family History: The patient's family history includes Allergies in her father and mother; Arthritis in her father and mother; Colitis in her daughter; Colon cancer in an other family member; Diabetes in her maternal grandfather and  mother; Heart disease in her brother, father, and mother; Lung cancer in her mother; Stroke in her father. Father had 4V CABG and valve replacement at age 14;  had a complication with ruptured aorta after surgery and passed away. Mother had lung cancer and diabetes. No other significant heart disease or risks.   ROS:   Please see the history of present illness.   (+) Wheezing  (+) Mild bilateral LE edema Additional pertinent ROS otherwise unremarkable.  EKGs/Labs/Other Studies Reviewed:    The following studies were reviewed today:  Echo 05/25/21  1. Septal and apical akinesis distal anterior wall and distal inferior  wall hypokinesis EF similar to TTE done 09/16/20. Left ventricular ejection  fraction, by estimation, is 30 to 35%. The left ventricle has moderately  decreased function. The left  ventricle has no regional wall motion abnormalities. The left ventricular  internal cavity size was moderately dilated. Left ventricular diastolic  parameters are consistent with Grade I diastolic dysfunction (impaired  relaxation). Elevated left  ventricular end-diastolic pressure.   2. Right ventricular systolic function is normal. The right ventricular  size is normal.   3. The mitral valve is normal in structure. Mild mitral valve  regurgitation. No evidence of mitral stenosis.   4. Calcified non coronary cusp . The aortic valve is tricuspid. There is  mild calcification of the aortic valve. Aortic valve regurgitation is not  visualized. Aortic valve sclerosis is present, with no evidence of aortic  valve stenosis.   5. The inferior vena cava is normal in size with greater than 50%  respiratory variability, suggesting right atrial pressure of 3 mmHg.   R/L Heart Cath 01/11/21   Prox LAD lesion is 95% stenosed proximal to 1st Diag &  Mid LAD lesion is 90% stenosed following   Mid LAD lesion is 90% stenosed.   A drug-eluting stent was successfully placed covering both lesions using a STENT ONYX FRONTIER 2.25X34.   Post intervention, there is a 0% residual stenosis.   --------------------------------------------------------------   Hemodynamic  findings consistent with moderate pulmonary hypertension.   There is no aortic valve stenosis.   SUMMARY Severe single-vessel CAD with 95% and 90% stenoses of the proximal and mid LAD at major 1st Diag branch.  Likely be culprit for reduced EF and anterior hypokinesis on echo. Successful DES PCI of the LAD using an Onyx Frontier DES 2.25 mm x 34 mm postdilated in tapered fashion from 2.6 to 2.29m.   TIMI III flow pre and post, reduced to 0% Otherwise minimal CAD in the Right dominant system. 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 Moderate to severely reduced cardiac function with Cardiac Output-Index of 4.05-2.59 by Fick and 3.75-2.40 by Thermal Dilution.   RECOMMENDATIONS Continue to titrate medical management of chronic Systolic and diastolic heart failure.  She was given 40 mg IV Lasix on the Cath Lab table.  Recommend that she takes 40 mg twice daily oral Lasix for 3 days postprocedure.   She will need close follow-up with either primary cardiologist or APP to initiate titration of medications. Clinically stable for d/c today  Echo 09/16/20 1. Septal apical and anterior wall hypokinesis EF decreased compard to TTE done 07/10/19 . Left ventricular ejection fraction, by estimation, is 35 to 40%. The left ventricle has moderately decreased function. The left ventricle demonstrates regional wall motion abnormalities (see scoring diagram/findings for description). The  left ventricular internal cavity size was moderately dilated. Left ventricular diastolic parameters are consistent with Grade II diastolic dysfunction (pseudonormalization). Elevated left ventricular end-diastolic pressure.   2. Right ventricular systolic function is normal. The right ventricular size is normal.   3. Left atrial size was mildly dilated.   4. The mitral valve is abnormal. Mild  mitral valve regurgitation. No evidence of mitral stenosis.   5. The aortic valve is tricuspid. There is moderate calcification of the aortic valve. Aortic valve regurgitation is not visualized. Mild to moderate aortic valve sclerosis/calcification is present, without any evidence of aortic stenosis.   6. The inferior vena cava is normal in size with greater than 50% respiratory variability, suggesting right atrial pressure of 3 mmHg.   FINDINGS   Left Ventricle: Septal apical and anterior wall hypokinesis EF decreased compard to TTE done 07/10/19.  CT Chest 08/23/2020: COMPARISON:  05/25/2020   FINDINGS: Cardiovascular: Aortic atherosclerosis. Tortuous thoracic aorta. Mild cardiomegaly. Three vessel coronary artery calcification. Aortic valve calcification.  IMPRESSION: 1. New trace right pleural fluid and left pleural fluid or thickening. Concurrent new mild pulmonary interlobular septal thickening. Constellation of findings suggests mild pulmonary edema/fluid overload. 2. No other explanation for patient's acute symptoms. 3. Decrease and resolution of pulmonary nodules, currently with morphology favoring scarring. 4. Coronary artery atherosclerosis. Aortic Atherosclerosis (ICD10-I70.0). 5. Aortic valvular calcifications. Consider echocardiography to evaluate for valvular dysfunction.  Echo 07/10/19 1. Hyperdynamic LV function; grade 1 diastolic dysfunction; moderate LVH;  trace AI.   2. Left ventricular ejection fraction, by estimation, is >75%. The left  ventricle has hyperdynamic function. The left ventricle has no regional  wall motion abnormalities. There is moderate left ventricular hypertrophy.  Left ventricular diastolic  parameters are consistent with Grade I diastolic dysfunction (impaired  relaxation).   3. Right ventricular systolic function is normal. The right ventricular  size is normal.   4. The mitral valve is normal in structure. No evidence of mitral valve   regurgitation. No evidence of mitral stenosis.   5. The aortic valve has an indeterminant number of cusps. Aortic valve  regurgitation is trivial. No aortic stenosis is present.  Echo 08/05/17 Study Conclusions   - Left ventricle: The cavity size was normal. There was mild focal   basal hypertrophy of the septum. Systolic function was vigorous.   The estimated ejection fraction was in the range of 65% to 70%.   Wall motion was normal; there were no regional wall motion   abnormalities. Doppler parameters are consistent with abnormal   left ventricular relaxation (grade 1 diastolic dysfunction).   Impressions:   - Definity used; vigorous LV systolic function; mild diastolic   dysfunction.  CT chest high resolution 6.17.19 FINDINGS: Cardiovascular: Heart size is normal. There is no significant pericardial fluid, thickening or pericardial calcification. There is aortic atherosclerosis, as well as atherosclerosis of the great vessels of the mediastinum and the coronary arteries, including calcified atherosclerotic plaque in the left main, left anterior descending left circumflex coronary arteries.  IMPRESSION: 1. No findings to suggest interstitial lung disease. 2. Mild air trapping indicative of mild small airways disease. 3. Aortic atherosclerosis, in addition to left main and 2 vessel coronary artery disease. Please note that although the presence of coronary artery calcium documents the presence of coronary artery disease, the severity of this disease and any potential stenosis cannot be assessed on this non-gated CT examination. Assessment for potential risk factor modification, dietary therapy or pharmacologic therapy may be warranted, if clinically indicated.  4. Additional incidental findings, as above.  Stress 2011:  Stress echo 11/2009 Study Conclusions - Stress ECG conclusions: There were no stress arrhythmias or conduction abnormalities. The stress ECG was  normal. - Staged echo: There was no echocardiographic evidence for stress-induced ischemia. Impressions: - Nondiagnostic stress echocardiogram with nochest pain, no ST changes; patient did not achieve target heart rate; no   stress-induced wall motion abnormalities.  Had [place] 12/2009 due to nondiagnostic stress echo: Stress Procedure  The patient received IV Lexiscan 0.4 mg over 15-seconds with concurrent low level exercise and then Technetium 24mTetrofosmin was injected at 30-seconds while the patient continued walking one more minute.  There were no significant changes with Lexiscan.  Quantitative spect images were obtained after a 45 minute delay.   QPS Raw Data Images:  Normal; no motion artifact; normal heart/lung ratio. Stress Images:  Normal homogeneous uptake in all areas of the myocardium. Rest Images:  Normal homogeneous uptake in all areas of the myocardium. Subtraction (SDS):  No evidence of ischemia. Transient Ischemic Dilatation:  1.15  (Normal <1.22)  Lung/Heart Ratio:  .27  (Normal <0.45)   Quantitative Gated Spect Images QGS EDV:  52 ml QGS ESV:  13 ml QGS EF:  75 % QGS cine images:  Normal motion   Findings Normal nuclear study  EKG:  EKG is personally reviewed. 11/13/21: not ordered today 09/12/2021:  EKG was not ordered. 06/08/21: NSR at 67 bpm, moderate LVH criteria, PRWP 01/06/2021: NSR at 71 bpm, moderate LVH criteria, PRWP 10/26/20: NSR at 80 bpm, PRWP, borderline LVH 02/23/20: sinus tachycardia a 106 bpm, lack of anterior forces V1/V2  Recent Labs: 01/19/2021: BNP 621.2 05/24/2021: TSH 1.161 09/19/2021: ALT 38; BUN 35; Creatinine 1.72; Potassium 4.4; Sodium 140 10/19/2021: Hemoglobin 10.2; Platelets 261   Recent Lipid Panel    Component Value Date/Time   CHOL 162 12/21/2020 0826   TRIG 210 (H) 12/21/2020 0826   HDL 64 12/21/2020 0826   CHOLHDL 2.5 12/21/2020 0826   CHOLHDL 8.4 09/23/2020 1253   VLDL UNABLE TO CALCULATE IF TRIGLYCERIDE OVER 400 mg/dL  09/23/2020 1253   LDLCALC 64 12/21/2020 0826   LDLCALC 77 07/04/2018 1017   LDLDIRECT 141.2 (H) 09/23/2020 1253    Physical Exam:    VS:  BP (!) 156/70 (BP Location: Right Arm, Patient Position: Sitting, Cuff Size: Normal)   Pulse 62   Ht '5\' 1"'$  (1.549 m)   Wt 138 lb 9.6 oz (62.9 kg)   SpO2 94%   BMI 26.19 kg/m     Wt Readings from Last 3 Encounters:  10/19/21 130 lb (59 kg)  09/19/21 137 lb 6.4 oz (62.3 kg)  09/12/21 137 lb 1.6 oz (62.2 kg)    GEN: Well nourished, well developed in no acute distress HEENT: Normal, moist mucous membranes NECK: No JVD CARDIAC: regular rhythm, normal S1 and S2, no rubs or gallops. No murmur. VASCULAR: Radial and DP pulses 2+ bilaterally. No carotid bruits RESPIRATORY:  Bronchial breath sounds with diffuse coarseness ABDOMEN: Soft, non-tender, non-distended MUSCULOSKELETAL:  Ambulates independently with walker SKIN: Warm and dry, mild bilateral LE edema NEUROLOGIC:  Alert and oriented x 3. No focal neuro deficits noted. PSYCHIATRIC:  Normal affect   ASSESSMENT:    1. Chronic combined systolic and diastolic heart failure (HCC)   2. Cardiomyopathy, unspecified type ([facility]   3. Coronary artery disease involving native coronary artery of native heart without angina pectoris   4. S/P coronary artery stent placement   5. Mixed hyperlipidemia  PLAN:    Unspecified cardiomyopathy Chronic systolic and diastolic heart failure -echo 06/2019 with hyperdynamic EF and only grade 1 diastolic dysfunction, then 08/2020 with reduced EF and focal wall motion abnormalities -mild LE edema today -counseled on heart failure education -current medications: furosemide 20 mg daily, empagliflozin 10 mg daily -has gone between 80 mg and 40 mg valsartan based on lability of blood pressure readings. Continue current dose -continue isordil-hydralazine, currently only able to take in the evening -was on bisoprolol 2.5 mg in the past, stopped 2/2 hypotension in 10/2020.  Consider retrial in the future if BP/HR allow  CAD s/p stent 01/11/21 -asymptomatic -on clopidogrel, aspirin for 1 year -continue atorvastatin 80 mg daily  Mixed hyperlipidemia with goal LDL <70 -lipids from 12/21/20 reviewed. LDL 64, TG 210 -continue atorvastatin 80 mg  If she does pursue knee replacement, recommend office visit for clearance to determine her current functional capacity, make sure no acute symptoms  Secondary prevention: -recommend heart healthy/Mediterranean diet, with whole grains, fruits, vegetable, fish, lean meats, nuts, and olive oil. Limit salt. -recommend moderate walking, 3-5 times/week for 30-50 minutes each session. Aim for at least 150 minutes.week. Goal should be pace of 3 miles/hours, or walking 1.5 miles in 30 minutes -recommend avoidance of tobacco products. Avoid excess alcohol.  Plan for follow up: 6 months.  Buford Dresser, MD, PhD, [facility]   Medication Adjustments/Labs and Tests Ordered: Current medicines are reviewed at length with the patient today.  Concerns regarding medicines are outlined above.   No orders of the defined types were placed in this encounter.  No orders of the defined types were placed in this encounter.  Patient Instructions  Medication Instructions:  Your Physician recommend you continue on your current medication as directed.    *If you need a refill on your cardiac medications before your next appointment, please call your pharmacy*   Lab Work: None ordered today   Testing/Procedures: None ordered today   Follow-Up: At [facility], you and your health needs are our priority.  As part of our continuing mission to provide you with exceptional heart care, we have created designated Provider Care Teams.  These Care Teams include your primary Cardiologist (physician) and Advanced Practice Providers (APPs -  Physician Assistants and Nurse Practitioners) who all work  together to provide you with the care you need, when you need it.  We recommend signing up for the patient portal called "MyChart".  Sign up information is provided on this After Visit Summary.  MyChart is used to connect with patients for Virtual Visits (Telemedicine).  Patients are able to view lab/test results, encounter notes, upcoming appointments, etc.  Non-urgent messages can be sent to your provider as well.   To learn more about what you can do with MyChart, go to [email or web address].    Your next appointment:   6 month(s)  The format for your next appointment:   In Person  Provider:   Buford Dresser, MD              I,Breanna Adamick,acting as a scribe for Buford Dresser, MD.,have documented all relevant documentation on the behalf of Buford Dresser, MD,as directed by  Buford Dresser, MD while in the presence of Buford Dresser, MD.  I, Buford Dresser, MD, have reviewed all documentation for this visit. The documentation on 11/13/21 for the exam, diagnosis, procedures, and orders are all accurate and complete.   Signed, Buford Dresser, MD PhD 11/13/2021   [facility]

## 2021-11-13 NOTE — Patient Instructions (Signed)
Medication Instructions:  Your Physician recommend you continue on your current medication as directed.    *If you need a refill on your cardiac medications before your next appointment, please call your pharmacy*   Lab Work: None ordered today   Testing/Procedures: None ordered today   Follow-Up: At [facility], you and your health needs are our priority.  As part of our continuing mission to provide you with exceptional heart care, we have created designated Provider Care Teams.  These Care Teams include your primary Cardiologist (physician) and Advanced Practice Providers (APPs -  Physician Assistants and Nurse Practitioners) who all work together to provide you with the care you need, when you need it.  We recommend signing up for the patient portal called "MyChart".  Sign up information is provided on this After Visit Summary.  MyChart is used to connect with patients for Virtual Visits (Telemedicine).  Patients are able to view lab/test results, encounter notes, upcoming appointments, etc.  Non-urgent messages can be sent to your provider as well.   To learn more about what you can do with MyChart, go to [email or web address].    Your next appointment:   6 month(s)  The format for your next appointment:   In Person  Provider:   Bridgette Christopher, MD          

## 2021-11-14 ENCOUNTER — Encounter: Payer: Self-pay | Admitting: Pulmonary Disease

## 2021-11-14 ENCOUNTER — Ambulatory Visit: Payer: Medicare PPO | Admitting: Pulmonary Disease

## 2021-11-14 VITALS — BP 126/62 | HR 74 | Ht 61.0 in | Wt 136.0 lb

## 2021-11-14 DIAGNOSIS — J479 Bronchiectasis, uncomplicated: Secondary | ICD-10-CM | POA: Diagnosis not present

## 2021-11-14 DIAGNOSIS — J398 Other specified diseases of upper respiratory tract: Secondary | ICD-10-CM

## 2021-11-14 DIAGNOSIS — J4551 Severe persistent asthma with (acute) exacerbation: Secondary | ICD-10-CM

## 2021-11-14 DIAGNOSIS — B379 Candidiasis, unspecified: Secondary | ICD-10-CM

## 2021-11-14 MED ORDER — IPRATROPIUM-ALBUTEROL 0.5-2.5 (3) MG/3ML IN SOLN: 3.0000 mL | Freq: Four times a day (QID) | RESPIRATORY_TRACT | 6 refills | Status: DC | PRN

## 2021-11-14 MED ORDER — PREDNISONE 10 MG PO TABS: ORAL_TABLET | ORAL | 0 refills | Status: DC

## 2021-11-14 MED ORDER — FLUCONAZOLE 100 MG PO TABS: 100.0000 mg | ORAL_TABLET | Freq: Every day | ORAL | 0 refills | Status: DC

## 2021-11-14 NOTE — Progress Notes (Signed)
Synopsis: Referred in 2011 for asthma by [name], MD.  Previously a patient of Drs. Sunnie Nielsen, Mc[place] and [place].  Subjective:   PATIENT ID: Ebony Scott DOB: 1950/04/14, MRN: [id number]  HPI  Chief Complaint  Patient presents with   Follow-up    F/U on bronchiectasis. States she has been wheezing more during the past few weeks. Productive cough with yellow phlegm.    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   She reports increased cough and wheezing over recent weeks. She was placed on extended steroid taper with some improvement but she reports it has progressed since completing the taper. She denies fevers chills or change in mucous production. She continues to produce yellow/clear sputum.   She received her RSV and influenza vaccines.  OV 03/15/21 She has been doing okay since last visit.  She reports persistent wheezing and cough at night.  She is coughing up thick yellow sputum.  She is currently taking 12.5 mg of prednisone daily for her rheumatoid arthritis and is being tapered down by Dr. Tobie Poet of rheumatology.  She is receiving IVIG infusions via Dr. Neldon Mc.  She reports she is having left ear and sinus congestion with some postnasal drainage.  He has been doing okay on Stiolto inhaler.  OV 11/15/20 She reports increased wheezing, cough and dyspnea since last visit. She is having a lot of wheezing at night. She remains on stiolto respimat daily and is using albuterol nebulizer treatments twice daily with flutter valve for mucous clearance. She complains of cough when eating/drinking.  She is being started on IVIG infusions by Dr. 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 She remains on prednisone 70m daily for her rheumatoid  arthritis.  We discussed her CT chest results from 08/23/20 which showed resolution of her pulmonary nodules with residual areas of scarring.  OV 06/14/20 She has completed two courses of diflucan per ENT for laryngeal candidiasis and her voice is much improved. We discontinued her inhaled steroid during this period of time as well. She is noticing more shortness of breath during the day.   She was recently transitioned to rituximab by her rheumatoligist and has been taken off liflunamide. She continues on 149mof prednisone daily.   CT Chest on 4/6 when compared to the CT Chest on 2/4 shows a decrease in the size of the nodular/consolidative opacities of the medial right lung base and left lower lobe. No new nodules or lesions noted.   OV 04/29/20 She underwent bronchoscopy on 04/05/20 and cultures remain unrevealing at this time. The BAL samples have grown candida albicans and we discussed that we typically do not treat this finding as it does not usually cause pathogenic disease.   She continues to experience hoarse voice and cough along with shortness of breath. She denies weight loss, night sweats, loss of appetite.   OV 04/01/20: CT scan 03/25/2020 showed multifocal areas of nodularity and consolidative changes. Findings consistent with atypical infection or possible malignancy. She was started on azithromycin and cefpodoxime by Dr. PiDennard SchaumannShe has completed the azithromycin and she started the cefpodoxime on 2/7. She was treated with levaquin on 03/21/20 for 7 days initially until the CT scan resulted. Blood cultures were negative on 02/23/20 when she presented with AMS.   She reports she is coughing  up brown sputum. At baseline she doe snot cough up sputum or it is clear. The sputum amount has declined she taking antibiotics. She denies fevers, chills or sweats. Her appetite is well.    OV 09/01/19: Ebony Scott is a 71 y/o woman who presents for follow up of bronchiectasis, tracheobronchomalacia,  immunosuppression, chronic AC for recurrent VTE. She started prescribed levofloxacin last evening for Pseudomonas bronchiectasis AE. So far she has not had nausea associated with taking this med, but took zofran prophylacticaly. She remains SOB with no change so far in her symptoms. She still coughs frequently despite using her inhaler and hypertonic saline nebs + flutter as prescribed. She has follow up with her PCP soon for hyperglycemiaa. She has follow up with her rheumatologist in August and is hoping that her prednisone will be able to be decreased soon.  Past Medical History:  Diagnosis Date   Abscess of dorsum of right hand 09/22/2020   Acute deep vein thrombosis (DVT) of right lower extremity ([place]) 12/13/2017   Allergic rhinitis    Anemia    Angio-edema    Anxiety    pt denies dx   Arthritis    Phreesia 10/21/2019   Asthma    Phreesia 10/21/2019   Breast cancer ([place]) 1998   Left breast, in remission   Cataract    REMOVED   CHF (congestive heart failure) ([place])    Clostridium difficile colitis 73/71/0626   Complication of anesthesia    "had hard time waking up from it several times" (02/20/2012), no anesthesia problems since 2014   Coronary artery disease    Depression    "some; don't take anything for it" (02/20/2012), pt denies dx as of 06/02/21   Diverticulosis    DVT (deep venous thrombosis) ([place])    Dyspnea    all the time   Fibromyalgia 11/2011   GERD (gastroesophageal reflux disease)    Graves disease    Headache(784.0)    "related to allergies; more at different times during the year" (02/20/2012)   Hemorrhoids    Hiatal hernia    back and neck   History of blood transfusion 2023   1 unit, 5 units of Iron   Hx of adenomatous colonic polyps 04/12/2016   Hypercholesteremia    good cholesterol is high   Hypothyroidism    IBS (irritable bowel syndrome)    Moderate persistent asthma    -FeV1 72% 2011, -IgE 102 2011, CT sinus Neg 2011   Osteomyelitis of second toe of  right foot ([place]) 09/23/2020   Osteoporosis    on reclast yearly   Peripheral vascular disease ([place]) 2019   DVTs (3) lungs and leg   Pneumonia 04/2011; ~ 11/2011   "double; single" (02/20/2012)   Septic olecranon bursitis of right elbow 09/22/2020   Seronegative rheumatoid arthritis ([place])    Dr. Lahoma Rocker   SIRS (systemic inflammatory response syndrome) ([place]) 02/10/2018   Sleep apnea    uses cpap nightly   Tracheobronchomalacia      Family History  Problem Relation Age of Onset   Allergies Mother    Heart disease Mother    Arthritis Mother    Lung cancer Mother        heavy smoker   Diabetes Mother    Allergies Father    Heart disease Father    Arthritis Father    Stroke Father    Heart disease Brother    Diabetes Maternal Grandfather    Colitis Daughter  Colon cancer Other        Maternal half uncle     Social History   Socioeconomic History   Marital status: Divorced    Spouse name: Not on file   Number of children: 2   Years of education: college   Highest education level: Not on file  Occupational History   Occupation: Disabled    Comment: Retired Engineer, production: RETIRED  Tobacco Use   Smoking status: Never    Passive exposure: Yes   Smokeless tobacco: Never   Tobacco comments:    Parents  Vaping Use   Vaping Use: Never used  Substance and Sexual Activity   Alcohol use: Not Currently    Alcohol/week: 0.0 standard drinks of alcohol   Drug use: No   Sexual activity: Not Currently    Birth control/protection: Surgical    Comment: Hysterectomy  Other Topics Concern   Not on file  Social History Narrative   Patient lives at home alone. Patient  divorced.    Patient has her BS degree.   Right handed.   Caffeine- sometimes coffee.      [facility]:   Born in [place], [place]. She worked as a Copywriter, advertising. She has no pets currently. She does have indoor plants. Previously had mold in her home that was remediated.  Carpet was removed.          Social Determinants of Health   Financial Resource Strain: Low Risk  (07/07/2020)   Overall Financial Resource Strain (CARDIA)    Difficulty of Paying Living Expenses: Not hard at all  Food Insecurity: No Food Insecurity (07/13/2021)   Hunger Vital Sign    Worried About Running Out of Food in the Last Year: Never true    Ran Out of Food in the Last Year: Never true  Transportation Needs: No Transportation Needs (07/13/2021)   PRAPARE - Hydrologist (Medical): No    Lack of Transportation (Non-Medical): No  Physical Activity: Insufficiently Active (07/13/2021)   Exercise Vital Sign    Days of Exercise per Week: 5 days    Minutes of Exercise per Session: 20 min  Stress: No Stress Concern Present (07/13/2021)   [place]    Feeling of Stress : Only a little  Social Connections: Moderately Isolated (07/13/2021)   Social Connection and Isolation Panel [NHANES]    Frequency of Communication with Friends and Family: More than three times a week    Frequency of Social Gatherings with Friends and Family: More than three times a week    Attends Religious Services: 1 to 4 times per year    Active Member of [employer] or Organizations: No    Attends Archivist Meetings: Never    Marital Status: Divorced  Human resources officer Violence: Not At Risk (07/13/2021)   Humiliation, Afraid, Rape, and Kick questionnaire    Fear of Current or Ex-Partner: No    Emotionally Abused: No    Physically Abused: No    Sexually Abused: No     Allergies  Allergen Reactions   Baclofen Other (See Comments)    Altered mental status requiring a 3-day hospital stay- patient became unresponsive   Dust Mite Extract Shortness Of Breath and Other (See Comments)    "sneezing" (02/20/2012)   Molds & Smuts Shortness Of Breath   Morphine And Related Hives and Itching   Other Shortness Of Breath and  Other (See Comments)    Grass and weeds "sneezing; filled sinuses" (02/20/2012)   Penicillins Rash and Other (See Comments)    "welts" (02/20/2012) Tolerated Ancef 02/16/20.    Rofecoxib Swelling and Other (See Comments)    Vioxx- feet swelling    Shellfish Allergy Anaphylaxis, Shortness Of Breath, Itching and Rash   Tetracycline Hcl Nausea And Vomiting   Xolair [Omalizumab] Other (See Comments)    Caused Blood clot   Zoledronic Acid Other (See Comments)    Fever, Put in hospital, dr said it was a reaction from a reaction  Other reaction(s): Unknown   Dilaudid [Hydromorphone Hcl] Itching   Levofloxacin Other (See Comments)    GI upset   Oxycodone Hcl Nausea And Vomiting   Paroxetine Nausea And Vomiting and Other (See Comments)    Paxil    Celecoxib Swelling and Other (See Comments)    Feet swelling   Diltiazem Hcl Swelling   Hydrocodone-Acetaminophen Itching   Lactose Intolerance (Gi) Other (See Comments)    Bloating and gas   Rituximab     Anemia    Tree Extract Other (See Comments)    "tested and told I was allergic to it; never experienced a reaction to it" (02/20/2012)   Gamunex [Immune Globulin] Itching and Rash    Tolerates hizentra    Penicillin G Procaine Rash and Other (See Comments)    Welts, also     Outpatient Medications Prior to Visit  Medication Sig Dispense Refill   acetaminophen (TYLENOL) 650 MG CR tablet Take 1,300 mg by mouth every 8 (eight) hours as needed for pain.     albuterol (VENTOLIN HFA) 108 (90 Base) MCG/ACT inhaler Inhale 2 puffs into the lungs every 6 (six) hours as needed for wheezing or shortness of breath. 19 g 1   allopurinol (ZYLOPRIM) 100 MG tablet Take 1 tablet (100 mg total) by mouth daily. 90 tablet 3   Alpha-D-Galactosidase (BEANO PO) Take 2 tablets by mouth 3 (three) times daily with meals.     Alpha-Lipoic Acid 600 MG TABS Take 600 mg by mouth daily.     atorvastatin (LIPITOR) 80 MG tablet Take 1 tablet (80 mg total) by mouth daily.  90 tablet 3   azelastine (ASTELIN) 0.1 % nasal spray Place 2 sprays into both nostrils daily. Use in each nostril as directed (Patient taking differently: Place 2 sprays into both nostrils at bedtime as needed for rhinitis. Use in each nostril as directed) 30 mL 5   benzonatate (TESSALON) 200 MG capsule TAKE 1 CAPSULE BY MOUTH THREE TIMES DAILY AS NEEDED FOR COUGH 30 capsule 0   Calcium-Magnesium-Vitamin D (CALCIUM MAGNESIUM PO) Take 1 tablet by mouth daily.     cetirizine (ZYRTEC) 10 MG tablet Take 1 tablet (10 mg total) by mouth 2 (two) times daily as needed for allergies (Can take a n extra dose during flare ups.). (Patient taking differently: Take 10 mg by mouth every evening.) 60 tablet 5   cholecalciferol (VITAMIN D) 25 MCG (1000 UNIT) tablet Take 1,000 Units by mouth every evening.     clopidogrel (PLAVIX) 75 MG tablet Take 1 tablet (75 mg total) by mouth daily. 90 tablet 3   cyproheptadine (PERIACTIN) 4 MG tablet Take 2 tablets (8 mg total) by mouth at bedtime. Take 2 tablets at bedtime as needed to treat facial pain or sleep dysfunction. (Patient taking differently: Take 8 mg by mouth at bedtime.) 60 tablet 5   denosumab (PROLIA) 60 MG/ML SOSY injection Inject 60  mg into the skin every 6 (six) months.     dexlansoprazole (DEXILANT) 60 MG capsule Take 1 capsule (60 mg total) by mouth daily. 90 capsule 3   diclofenac Sodium (VOLTAREN) 1 % GEL Apply 1 Application topically 4 (four) times daily as needed (pain). 100 g 3   empagliflozin (JARDIANCE) 10 MG TABS tablet Take 1 tablet (10 mg total) by mouth daily before breakfast. 30 tablet 5   EPINEPHrine 0.3 mg/0.3 mL IJ SOAJ injection USE AS DIRECTED BY YOUR PHYSICIAN INTRAMUSCULARLY AS NEEDED FOR ANAPHYLAXIS 2 each 1   famotidine (PEPCID) 20 MG tablet Take 1 tablet (20 mg total) by mouth at bedtime. 30 tablet 5   fluticasone (FLONASE) 50 MCG/ACT nasal spray USE 2 SPRAYS IN EACH NOSTRIL DAILY (Patient taking differently: Place 2 sprays into both  nostrils daily as needed for allergies.) 16 g 2   folic acid (FOLVITE) 1 MG tablet Take 1 tablet (1 mg total) by mouth daily. 90 tablet 3   furosemide (LASIX) 40 MG tablet Take 1 tablet (40 mg total) by mouth daily. 90 tablet 2   gabapentin (NEURONTIN) 100 MG capsule Take 1 capsule (100 mg total) by mouth 3 (three) times daily. 90 capsule 3   guaiFENesin (MUCINEX) 600 MG 12 hr tablet Take 1 tablet (600 mg total) by mouth 2 (two) times daily as needed for cough or to loosen phlegm. (Patient taking differently: Take 600 mg by mouth 2 (two) times daily.) 60 tablet 1   HIZENTRA 1 GM/5ML SOLN Hizentra 3 times/5 ml = 65 ml total once weekly on Fridays     isosorbide-hydrALAZINE (BIDIL) 20-37.5 MG tablet TAKE 1 TABLET BY MOUTH TWICE DAILY 60 tablet 1   Lactase (LACTOSE FAST ACTING RELIEF PO) Take 3 tablets by mouth 3 (three) times daily.     lidocaine-prilocaine (EMLA) cream Apply 1 application  topically as needed (pain).     magnesium oxide (MAG-OX) 400 (240 Mg) MG tablet Take 400 mg by mouth daily.     Menthol, Topical Analgesic, (BIOFREEZE) 4 % GEL Apply 1 application. topically daily as needed (pain).     mirtazapine (REMERON SOL-TAB) 30 MG disintegrating tablet Take 1 tablet (30 mg total) by mouth at bedtime as needed (sleep). 90 tablet 3   montelukast (SINGULAIR) 10 MG tablet Take 1 tablet (10 mg total) by mouth daily. (Patient taking differently: Take 10 mg by mouth every evening.) 30 tablet 5   Nystatin (GERHARDT'S BUTT CREAM) CREA Apply topically 3 times a day as needed for itching or irritation 4 each 6   nystatin (MYCOSTATIN) 100000 UNIT/ML suspension Use as directed 15 mLs in the mouth or throat at bedtime as needed (mouth sores).     nystatin-triamcinolone ointment (MYCOLOG) Apply 1 Application topically 2 (two) times daily as needed (vaginal irritation).     potassium chloride SA (KLOR-CON M) 20 MEQ tablet Take 20 mEq by mouth daily.     predniSONE (DELTASONE) 1 MG tablet TAKE TWO TABLET  WITH BREAKFAST... TAKE WITH 10 MG FOR A TOTAL OF 12 MG DAILY (Patient taking differently: Take 2 mg by mouth. Total of 7 mg daily.) 60 tablet 0   predniSONE (DELTASONE) 5 MG tablet Take 1 tablet (5 mg total) by mouth daily with breakfast. Along with 2 mg tablets for total daily dose 7 mg total daily 90 tablet 0   Prenatal Vit-Fe Fumarate-FA (PRENATAL VITAMINS PO) Take 1 tablet by mouth daily.     Probiotic Product (ALIGN PO) Take 1 capsule by  mouth daily.     Propylene Glycol (SYSTANE COMPLETE) 0.6 % SOLN Place 1 drop into both eyes 2 (two) times daily. 15 mL 5   Tiotropium Bromide-Olodaterol (STIOLTO RESPIMAT) 2.5-2.5 MCG/ACT AERS Inhale 2 puffs into the lungs daily. (Patient taking differently: Inhale 2 puffs into the lungs every evening.) 4 g 11   traMADol (ULTRAM) 50 MG tablet Take 1 tablet (50 mg total) by mouth at bedtime. 30 tablet 2   valsartan (DIOVAN) 80 MG tablet Take 1 tablet (80 mg total) by mouth daily. 90 tablet 3   XARELTO 20 MG TABS tablet TAKE 1 TABLET BY MOUTH DAILY WITH SUPPER 90 tablet 3   Facility-Administered Medications Prior to Visit  Medication Dose Route Frequency Provider Last Rate Last Admin   iohexol (OMNIPAQUE) 350 MG/ML injection    PRN Leonie Man, MD   105 mL at 01/11/21 0955   Review of Systems  Constitutional:  Negative for chills, fever, malaise/fatigue and weight loss.  HENT:  Negative for congestion, ear pain, sinus pain and sore throat.   Eyes: Negative.   Respiratory:  Positive for cough, sputum production, shortness of breath and wheezing. Negative for hemoptysis.   Cardiovascular:  Negative for chest pain, palpitations, orthopnea, claudication and leg swelling.  Gastrointestinal:  Negative for abdominal pain, heartburn, nausea and vomiting.  Genitourinary: Negative.   Musculoskeletal:  Negative for joint pain and myalgias.  Skin:  Negative for rash.  Neurological:  Negative for weakness.  Endo/Heme/Allergies: Negative.    Psychiatric/Behavioral: Negative.      Objective:   Vitals:   11/14/21 1411  BP: 126/62  Pulse: 74  SpO2: 96%  Weight: 136 lb (61.7 kg)  Height: _0  (1.549 m)   Physical Exam Constitutional:      General: She is not in acute distress.    Appearance: She is not ill-appearing.  HENT:     Head: Normocephalic and atraumatic.  Eyes:     General: No scleral icterus.    Conjunctiva/sclera: Conjunctivae normal.     Pupils: Pupils are equal, round, and reactive to light.  Cardiovascular:     Rate and Rhythm: Normal rate and regular rhythm.     Pulses: Normal pulses.     Heart sounds: Normal heart sounds. No murmur heard. Pulmonary:     Effort: Pulmonary effort is normal.     Breath sounds: Rhonchi present. No wheezing or rales.  Abdominal:     General: Bowel sounds are normal.     Palpations: Abdomen is soft.  Musculoskeletal:     Right lower leg: No edema.     Left lower leg: No edema.  Skin:    General: Skin is warm and dry.  Neurological:     General: No focal deficit present.     Mental Status: She is alert.    CBC    Component Value Date/Time   WBC 8.0 10/19/2021 0739   RBC 2.90 (L) 10/19/2021 0739   HGB 10.2 (L) 10/19/2021 0739   HGB 9.1 (L) 09/19/2021 1018   HGB 6.4 (LL) 02/03/2021 0817   HCT 32.2 (L) 10/19/2021 0739   HCT 20.6 (L) 02/03/2021 0817   PLT 261 10/19/2021 0739   PLT 219 09/19/2021 1018   PLT 306 02/03/2021 0817   MCV 111.0 (H) 10/19/2021 0739   MCV 102 (H) 02/03/2021 0817   MCH 35.2 (H) 10/19/2021 0739   MCHC 31.7 10/19/2021 0739   RDW 16.7 (H) 10/19/2021 0739   RDW 16.3 (H) 02/03/2021 2130  LYMPHSABS 1.9 10/19/2021 0739   MONOABS 1.3 (H) 10/19/2021 0739   EOSABS 0.5 10/19/2021 0739   BASOSABS 0.1 10/19/2021 0739      Latest Ref Rng & Units 09/19/2021   10:18 AM 08/14/2021   11:56 AM 05/24/2021    2:59 PM  BMP  Glucose 70 - 99 mg/dL 177  218  196   BUN 8 - 23 mg/dL 35  38  33   Creatinine 0.44 - 1.00 mg/dL 1.72  1.37  1.44    BUN/Creat Ratio 6 - 22 (calc)  28    Sodium 135 - 145 mmol/L 140  141  140   Potassium 3.5 - 5.1 mmol/L 4.4  4.4  4.4   Chloride 98 - 111 mmol/L 106  105  104   CO2 22 - 32 mmol/L _0 Calcium 8.9 - 10.3 mg/dL 8.8  8.9  9.3    Chest imaging: Chest Imaging- films reviewed: CT Chest wo contrast 08/23/20 - new trace bilateral pleural effusions and septal thickening. Decrease and resolution of pulmonary nodules.   CT Chest wo contrast 05/25/20 - near resolution of the RUL 28m nodule. Nodular opacity of medial RUL is now 684mfrom 1343mThe opacity of the medial RLL is now 1.8x1.0cm from 2.0x1.1 and the LLL opacity is 1.9x1.6cm from 3.7x3.7cm.  CT chest wo contrast 03/25/2020 - multifocal areas of nodularity and consolidative changes. Findings consistent with atypical infection or possible malignancy.  CT high-resolution 10/24/2018-mild bronchiectasis right middle lobe, lingula, right lower lobe, but does taper more appropriately distally.  Persistent nodules in right middle lobe and lingula with mild tree-in-bud opacities.  No inter or intralobular septal thickening or significant groundglass.  Expiratory images with some mosaicism and significant bronchial and tracheal collapse, especially on the right.    HRCT chest 08/30/19: RUL TIB opacities, multilobar bronchiectasis. Tracheobronchomalacia progressed since previous CT scan. Subacute rib fractures- healing. CAD.  PFT:    Latest Ref Rng & Units 08/20/2019    9:55 AM 09/18/2018   10:00 AM 06/19/2017    9:48 AM 08/08/2015   10:35 AM 04/29/2015    9:55 AM  PFT Results  FVC-Pre L 1.72  1.74  P 2.00  2.08  2.24   FVC-Predicted Pre % 66  66  P 74  75  81   FVC-Post L 1.65  1.61  P 1.86  2.17  2.25   FVC-Predicted Post % 63  61  P 69  78  81   Pre FEV1/FVC % % 90  88  P 90  83  87   Post FEV1/FCV % % 90  91  P 93  87  84   FEV1-Pre L 1.55  1.53  P 1.80  1.73  1.95   FEV1-Predicted Pre % 79  76  P 88  82  92   FEV1-Post L 1.49  1.47  P 1.72  1.89   1.89   DLCO uncorrected ml/min/mmHg 11.33  13.51  P 13.68  12.36  12.99   DLCO UNC% % 64  77  P 66  60  63   DLCO corrected ml/min/mmHg 11.33    13.76  12.87   DLCO COR %Predicted % 64    66  62   DLVA Predicted % 83  92  P 85  94  81   TLC L 3.71  3.52  P 3.95   3.91   TLC % Predicted % 80  76  P  85   84   RV % Predicted % 76  79  P 86   83     P Preliminary result   2020-no significant obstruction, mild restriction, mild DLCO reduction.  Flow volume loops consistent with restriction.   2021-no obstruction or bronchodilator reversibility.  No significant restriction.  Mild diffusion impairment.  Compared to 2020, mild improvement.   Previous respiratory cultures- all fungal and AFB negative from right middle lobe and lingula during bronchoscopy 08/2016, only positive for normal flora  BAL 04/05/20 - Candida Albicans    Pathology 08/23/2016: BAL lingula-no AFB or fungus   Echocardiogram: Unable to review report from 02/2018.  08/05/2017 demonstrates LVEF 65 to 83%, grade 1 diastolic dysfunction.  Normal LA, RV, RA.  Trivial TR and MR, otherwise normal valves.   Echocardiogram 07/10/2019-LVEF greater than 75% with hyperdynamic function, moderate LVH with grade 1 diastolic dysfunction.  Normal LA, RV, RA.  Trivial AR, otherwise normal valves.  Allergy panel 04/02/2016-increased IgE for Aspergillus, otherwise negative Alpha-1 antitrypsin 170   Bronchoscopy 12/24/2018 (performed at [facility]) Neutrophils 12% Lymphocytes 1% Macrophages 79% Respiratory viral panel negative Unable to see PJP DNA Respiratory culture-1+ mixed gram-negative rods, normal flora Fungus culture negative AFB negative   08/25/19 sputum- Pan-sensitive Pseudomonas   Barium Swallow 02/15/2015  1. Small type 1 transient hiatal hernia. 2. Incomplete epiglottic inversion during swallowing, with laryngeal penetration of thick liquid to the level of the vocal cords. 3. No stricture or dysmotility identified. 4. 4  mm anterolisthesis at C4-5.  Assessment & Plan:   Severe persistent asthma with acute exacerbation - Plan: predniSONE (DELTASONE) 10 MG tablet, ipratropium-albuterol (DUONEB) 0.5-2.5 (3) MG/3ML SOLN  Bronchiectasis without complication (HCC)  Tracheobronchomalacia  Yeast infection - Plan: fluconazole (DIFLUCAN) 100 MG tablet  Discussion: Ashland Osmer is a 71 year old woman, never smoker with asthma, tracheobronchomalacia on CPAP with oxygen, bronchiectasis and rheumatoid arthritis on prednisone (previously on leflunamide and rituximab) who returns to pulmonary clinic for follow up.   She appears to be having exacerbation of her asthma at this time with rhonchi on exam. We will treat with extended steroid taper. I will discuss case with Dr. Neldon Mc of Allergy/Immunology if she would be a candidate for a biologic therapy for her asthma.   Her chronic cough and breathing issues due in combination to her swallowing issues with incomplete epiglottic inversion leading to laryngeal penetration of liquid to the vocal cords.  She also has bronchiectasis and tracheobronchomalacia.  She is to continue Stiolto inhaler 2 puffs daily and and incroporate duonebs 2-3 times per day followed by chest vest therapy and then flutter valve therapy.  The nodules and opacities on her CT scans have significantly improved/resolved from 03/2020 to the recent scan on 08/2020 likely representing an infectious or inflammatory etiology. No further follow up of the pulmonary nodules at this time.  In regards to surgical planning for right knee replacement, she is intermediate to high risk based on ARISCAT risk index with 13.3% to 42.1% risk of in-hospital postoperative pulmonary complications, as defined by the occurrence of respiratory failure, respiratory infection, pleural effusion, atelectasis, pneumothorax, bronchospasm or aspiration pneumonitis. I would recommend extubation to bipap after surgery until she is fully  recovered from anesthesia.   Follow up in 4 months.   Freda Jackson, MD [facility] Office: [phone]   Current Outpatient Medications:    acetaminophen (TYLENOL) 650 MG CR tablet, Take 1,300 mg by mouth every 8 (eight) hours as  needed for pain., Disp: , Rfl:    albuterol (VENTOLIN HFA) 108 (90 Base) MCG/ACT inhaler, Inhale 2 puffs into the lungs every 6 (six) hours as needed for wheezing or shortness of breath., Disp: 19 g, Rfl: 1   allopurinol (ZYLOPRIM) 100 MG tablet, Take 1 tablet (100 mg total) by mouth daily., Disp: 90 tablet, Rfl: 3   Alpha-D-Galactosidase (BEANO PO), Take 2 tablets by mouth 3 (three) times daily with meals., Disp: , Rfl:    Alpha-Lipoic Acid 600 MG TABS, Take 600 mg by mouth daily., Disp: , Rfl:    atorvastatin (LIPITOR) 80 MG tablet, Take 1 tablet (80 mg total) by mouth daily., Disp: 90 tablet, Rfl: 3   azelastine (ASTELIN) 0.1 % nasal spray, Place 2 sprays into both nostrils daily. Use in each nostril as directed (Patient taking differently: Place 2 sprays into both nostrils at bedtime as needed for rhinitis. Use in each nostril as directed), Disp: 30 mL, Rfl: 5   benzonatate (TESSALON) 200 MG capsule, TAKE 1 CAPSULE BY MOUTH THREE TIMES DAILY AS NEEDED FOR COUGH, Disp: 30 capsule, Rfl: 0   Calcium-Magnesium-Vitamin D (CALCIUM MAGNESIUM PO), Take 1 tablet by mouth daily., Disp: , Rfl:    cetirizine (ZYRTEC) 10 MG tablet, Take 1 tablet (10 mg total) by mouth 2 (two) times daily as needed for allergies (Can take a n extra dose during flare ups.). (Patient taking differently: Take 10 mg by mouth every evening.), Disp: 60 tablet, Rfl: 5   cholecalciferol (VITAMIN D) 25 MCG (1000 UNIT) tablet, Take 1,000 Units by mouth every evening., Disp: , Rfl:    clopidogrel (PLAVIX) 75 MG tablet, Take 1 tablet (75 mg total) by mouth daily., Disp: 90 tablet, Rfl: 3   cyproheptadine (PERIACTIN) 4 MG tablet, Take 2 tablets (8 mg total) by mouth at bedtime. Take  2 tablets at bedtime as needed to treat facial pain or sleep dysfunction. (Patient taking differently: Take 8 mg by mouth at bedtime.), Disp: 60 tablet, Rfl: 5   denosumab (PROLIA) 60 MG/ML SOSY injection, Inject 60 mg into the skin every 6 (six) months., Disp: , Rfl:    dexlansoprazole (DEXILANT) 60 MG capsule, Take 1 capsule (60 mg total) by mouth daily., Disp: 90 capsule, Rfl: 3   diclofenac Sodium (VOLTAREN) 1 % GEL, Apply 1 Application topically 4 (four) times daily as needed (pain)., Disp: 100 g, Rfl: 3   empagliflozin (JARDIANCE) 10 MG TABS tablet, Take 1 tablet (10 mg total) by mouth daily before breakfast., Disp: 30 tablet, Rfl: 5   EPINEPHrine 0.3 mg/0.3 mL IJ SOAJ injection, USE AS DIRECTED BY YOUR PHYSICIAN INTRAMUSCULARLY AS NEEDED FOR ANAPHYLAXIS, Disp: 2 each, Rfl: 1   famotidine (PEPCID) 20 MG tablet, Take 1 tablet (20 mg total) by mouth at bedtime., Disp: 30 tablet, Rfl: 5   fluconazole (DIFLUCAN) 100 MG tablet, Take 1 tablet (100 mg total) by mouth daily., Disp: 3 tablet, Rfl: 0   fluticasone (FLONASE) 50 MCG/ACT nasal spray, USE 2 SPRAYS IN EACH NOSTRIL DAILY (Patient taking differently: Place 2 sprays into both nostrils daily as needed for allergies.), Disp: 16 g, Rfl: 2   folic acid (FOLVITE) 1 MG tablet, Take 1 tablet (1 mg total) by mouth daily., Disp: 90 tablet, Rfl: 3   furosemide (LASIX) 40 MG tablet, Take 1 tablet (40 mg total) by mouth daily., Disp: 90 tablet, Rfl: 2   gabapentin (NEURONTIN) 100 MG capsule, Take 1 capsule (100 mg total) by mouth 3 (three) times daily., Disp: 90  capsule, Rfl: 3   guaiFENesin (MUCINEX) 600 MG 12 hr tablet, Take 1 tablet (600 mg total) by mouth 2 (two) times daily as needed for cough or to loosen phlegm. (Patient taking differently: Take 600 mg by mouth 2 (two) times daily.), Disp: 60 tablet, Rfl: 1   HIZENTRA 1 GM/5ML SOLN, Hizentra 3 times/5 ml = 65 ml total once weekly on Fridays, Disp: , Rfl:    ipratropium-albuterol (DUONEB) 0.5-2.5 (3)  MG/3ML SOLN, Take 3 mLs by nebulization every 6 (six) hours as needed., Disp: 360 mL, Rfl: 6   isosorbide-hydrALAZINE (BIDIL) 20-37.5 MG tablet, TAKE 1 TABLET BY MOUTH TWICE DAILY, Disp: 60 tablet, Rfl: 1   Lactase (LACTOSE FAST ACTING RELIEF PO), Take 3 tablets by mouth 3 (three) times daily., Disp: , Rfl:    lidocaine-prilocaine (EMLA) cream, Apply 1 application  topically as needed (pain)., Disp: , Rfl:    magnesium oxide (MAG-OX) 400 (240 Mg) MG tablet, Take 400 mg by mouth daily., Disp: , Rfl:    Menthol, Topical Analgesic, (BIOFREEZE) 4 % GEL, Apply 1 application. topically daily as needed (pain)., Disp: , Rfl:    mirtazapine (REMERON SOL-TAB) 30 MG disintegrating tablet, Take 1 tablet (30 mg total) by mouth at bedtime as needed (sleep)., Disp: 90 tablet, Rfl: 3   montelukast (SINGULAIR) 10 MG tablet, Take 1 tablet (10 mg total) by mouth daily. (Patient taking differently: Take 10 mg by mouth every evening.), Disp: 30 tablet, Rfl: 5   Nystatin (GERHARDT'S BUTT CREAM) CREA, Apply topically 3 times a day as needed for itching or irritation, Disp: 4 each, Rfl: 6   nystatin (MYCOSTATIN) 100000 UNIT/ML suspension, Use as directed 15 mLs in the mouth or throat at bedtime as needed (mouth sores)., Disp: , Rfl:    nystatin-triamcinolone ointment (MYCOLOG), Apply 1 Application topically 2 (two) times daily as needed (vaginal irritation)., Disp: , Rfl:    potassium chloride SA (KLOR-CON M) 20 MEQ tablet, Take 20 mEq by mouth daily., Disp: , Rfl:    predniSONE (DELTASONE) 1 MG tablet, TAKE TWO TABLET WITH BREAKFAST... TAKE WITH 10 MG FOR A TOTAL OF 12 MG DAILY (Patient taking differently: Take 2 mg by mouth. Total of 7 mg daily.), Disp: 60 tablet, Rfl: 0   predniSONE (DELTASONE) 10 MG tablet, Take 4 tablets (40 mg total) by mouth daily with breakfast for 3 days, THEN 3 tablets (30 mg total) daily with breakfast for 3 days, THEN 2 tablets (20 mg total) daily with breakfast for 3 days, THEN 1 tablet (10 mg  total) daily with breakfast for 3 days., Disp: 30 tablet, Rfl: 0   predniSONE (DELTASONE) 5 MG tablet, Take 1 tablet (5 mg total) by mouth daily with breakfast. Along with 2 mg tablets for total daily dose 7 mg total daily, Disp: 90 tablet, Rfl: 0   Prenatal Vit-Fe Fumarate-FA (PRENATAL VITAMINS PO), Take 1 tablet by mouth daily., Disp: , Rfl:    Probiotic Product (ALIGN PO), Take 1 capsule by mouth daily., Disp: , Rfl:    Propylene Glycol (SYSTANE COMPLETE) 0.6 % SOLN, Place 1 drop into both eyes 2 (two) times daily., Disp: 15 mL, Rfl: 5   Tiotropium Bromide-Olodaterol (STIOLTO RESPIMAT) 2.5-2.5 MCG/ACT AERS, Inhale 2 puffs into the lungs daily. (Patient taking differently: Inhale 2 puffs into the lungs every evening.), Disp: 4 g, Rfl: 11   traMADol (ULTRAM) 50 MG tablet, Take 1 tablet (50 mg total) by mouth at bedtime., Disp: 30 tablet, Rfl: 2   valsartan (DIOVAN)  80 MG tablet, Take 1 tablet (80 mg total) by mouth daily., Disp: 90 tablet, Rfl: 3   XARELTO 20 MG TABS tablet, TAKE 1 TABLET BY MOUTH DAILY WITH SUPPER, Disp: 90 tablet, Rfl: 3 No current facility-administered medications for this visit.  Facility-Administered Medications Ordered in Other Visits:    iohexol (OMNIPAQUE) 350 MG/ML injection, , , PRN, Leonie Man, MD, 105 mL at 01/11/21 [phone]

## 2021-11-14 NOTE — Patient Instructions (Addendum)
I will talk with Dr. Neldon Mc if we think trying a biologic will help your breathing out but not affect your immunodeficiency  Start prednisone taper  Resume duoneb nebulizer treatments 2-3 times per day, followed by chest vest therapy and then flutter valve for airway clearance.   Continue stiolto 2 puffs daily  We will send today's note to your surgeon for planning of your knee surgery.   Follow up in 4 months

## 2021-11-15 NOTE — Telephone Encounter (Signed)
Concerns addressed at 9/25 appointment

## 2021-11-20 ENCOUNTER — Other Ambulatory Visit: Payer: Self-pay | Admitting: Internal Medicine

## 2021-11-20 DIAGNOSIS — M0600 Rheumatoid arthritis without rheumatoid factor, unspecified site: Secondary | ICD-10-CM

## 2021-11-21 ENCOUNTER — Other Ambulatory Visit: Payer: Self-pay | Admitting: Family Medicine

## 2021-11-21 ENCOUNTER — Other Ambulatory Visit: Payer: Self-pay | Admitting: Internal Medicine

## 2021-11-21 ENCOUNTER — Telehealth: Payer: Self-pay | Admitting: Internal Medicine

## 2021-11-21 ENCOUNTER — Other Ambulatory Visit: Payer: Self-pay

## 2021-11-21 ENCOUNTER — Telehealth: Payer: Self-pay | Admitting: Family Medicine

## 2021-11-21 DIAGNOSIS — M0600 Rheumatoid arthritis without rheumatoid factor, unspecified site: Secondary | ICD-10-CM

## 2021-11-21 DIAGNOSIS — J4550 Severe persistent asthma, uncomplicated: Secondary | ICD-10-CM

## 2021-11-21 MED ORDER — MONTELUKAST SODIUM 10 MG PO TABS: 10.0000 mg | ORAL_TABLET | Freq: Every day | ORAL | 5 refills | Status: DC

## 2021-11-21 NOTE — Telephone Encounter (Signed)
Patient called to request refills of  predniSONE (DELTASONE) 1 MG tablet  predniSONE (DELTASONE) 5 MG tablet  montelukast (SINGULAIR) 10 MG tablet   Pharmacy confirmed as:  [facility] [address], [place] [address]  Phone:  [phone]  Fax:  [phone]   LOV: 08/14/2021  Patient stated she's currently out of both prednisone scripts and singulair; stated she took the last dose of all three yesterday.  Please advise at [phone].

## 2021-11-21 NOTE — Telephone Encounter (Signed)
Patient called requesting prescription refills for Prednisone 1 mg and Prednisone 5 mg to be sent to [facility].  Patient states she called her PCP Dr. Dennard Schaumann and was told she needed to contact Dr. Benjamine Mola for her refill for Prednisone.  Patient states she is out of medication and requesting the refill be sent in this afternoon.

## 2021-11-21 NOTE — Telephone Encounter (Signed)
Next Visit: Due around 11/09/2021. Message sent to the front to schedule.   Last Visit: 08/09/2021  Last Fill:05/01/2021  Dx: Rheumatoid arthritis with negative rheumatoid factor, involving unspecified site   Current Dose per office note on 08/09/2021: prednisone 7 mg daily  Okay to refill prednisone?

## 2021-11-21 NOTE — Telephone Encounter (Signed)
Patient called requesting prescription refills for Prednisone 1 mg and Prednisone 5 mg to be sent to [facility]. Patient states she is out of medication and requesting the refill be sent in this afternoon.   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

## 2021-11-22 ENCOUNTER — Other Ambulatory Visit: Payer: Self-pay | Admitting: Family Medicine

## 2021-11-22 MED ORDER — PREDNISONE 5 MG PO TABS: 5.0000 mg | ORAL_TABLET | Freq: Every day | ORAL | 0 refills | Status: DC

## 2021-11-22 MED ORDER — PREDNISONE 1 MG PO TABS: 2.0000 mg | ORAL_TABLET | Freq: Every day | ORAL | 0 refills | Status: DC

## 2021-11-22 NOTE — Telephone Encounter (Signed)
Notified patient provider will refill singulair and is advising she refill her prednisone at her rheumatologist. Patient verbalized understanding and stated she already contacted them as a backup.

## 2021-11-22 NOTE — Telephone Encounter (Signed)
Requested medication (s) are due for refill today:   Provider to review.   (There are several requests/refusals for this medication)  Requested medication (s) are on the active medication list:   Yes  Future visit scheduled:   No   Last ordered: 08/08/2021 #60, 0 refills  Non delegated refill reason returned   Requested Prescriptions  Pending Prescriptions Disp Refills   predniSONE (DELTASONE) 1 MG tablet [Pharmacy Med Name: prednisone 1 mg tablet] 60 tablet 3    Sig: TAKE 2 TABLETS BY MOUTH DAILY WITH BREAKFAST.Marland KitchenMarland KitchenTAKE WITH '10MG'$  TO EQUAL '12MG'$  DAILY     Not Delegated - Endocrinology:  Oral Corticosteroids Failed - 11/22/2021  1:14 PM      Failed - This refill cannot be delegated      Failed - Manual Review: Eye exam for IOP if prolonged treatment      Failed - Glucose (serum) in normal range and within 180 days    Glucose, Bld  Date Value Ref Range Status  09/19/2021 177 (H) 70 - 99 mg/dL Final    Comment:    Glucose reference range applies only to samples taken after fasting for at least 8 hours.   Glucose-Capillary  Date Value Ref Range Status  08/28/2021 105 (H) 70 - 99 mg/dL Final    Comment:    Glucose reference range applies only to samples taken after fasting for at least 8 hours.         Failed - Bone Mineral Density or Dexa Scan completed in the last 2 years      80 - K in normal range and within 180 days    Potassium  Date Value Ref Range Status  09/19/2021 4.4 3.5 - 5.1 mmol/L Final         Passed - Na in normal range and within 180 days    Sodium  Date Value Ref Range Status  09/19/2021 140 135 - 145 mmol/L Final  02/03/2021 141 134 - 144 mmol/L Final         Passed - Last BP in normal range    BP Readings from Last 1 Encounters:  11/14/21 126/62         Passed - Valid encounter within last 6 months    Recent Outpatient Visits           5 months ago Chronic fatigue   [place] Dennard Schaumann, Cammie Mcgee, MD   9 months ago Anemia,  unspecified type   [place] Dennard Schaumann, Cammie Mcgee, MD   9 months ago Anemia, unspecified type   [place] [name], MD   11 months ago Type 2 diabetes mellitus with other specified complication, unspecified whether long term insulin use ([place])   [place] [name], MD   1 year ago Chronic combined systolic and diastolic CHF (congestive heart failure) ([place])   [facility] [name], Cammie Mcgee, MD       Future Appointments             In 1 week Rice, Resa Miner, MD [facility] [place] [place]   In 5 months Buford Dresser, MD [facility]

## 2021-11-22 NOTE — Progress Notes (Signed)
Office Visit Note  Patient: Ebony Scott             Date of Birth: 12/06/1950           MRN: [id number]             PCP: [name], MD Referring: [name], MD Visit Date: 11/29/2021   Subjective:  Follow-up (Bilateral hand swelling and pain. Knee pain and swelling (L.R) and feet swelling. )   History of Present Illness: Ebony Scott is a 71 y.o. female here for follow up for seronegative RA and cushing's syndrome currently on long term prednisone. She is on hizentra for hypogammaglobulinemia. Since last visit she had a bone marrow biopsy for macrocytosis with benign findings. She is having an exacerbation of asthma with wheezing and coughing increased since early September and did not improve well with a prolonged steroid taper. Subsequently has not been prescribed azithromycin and higher dose steroid starting about 5 days ago so far still having a lot of coughing and symptoms. She notices increased joint pain in hands and knees during this episode. She completed a series of 3 viscosupplementation injections in the left knee about 2 weeks ago with a partial benefit.  Previous HPI 08/09/2021 Ebony Scott is a 71 y.o. female here for follow up or seronegative RA and cushing's syndrome on prednisone with gradual tapering but increased back to 7 mg daily after last visit.  She is having some increased joint pain at multiple areas but worst problem is increased pain in her left knee.  This is severe enough to limit her tolerance for weightbearing and mobility.  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  She has not had any severe interval infections.  She is scheduled for hammertoe corrective surgery next month.   Previous HPI 04/28/21 Ebony Scott is a 71 y.o. female here for follow up for seronegative RA and cushing's syndrome on prednisone with gradual tapering from 8 mg in December. She developed worsening iron deficiency anemia after  GI bleed getting venofer replacement. Joint and skin problems were remaining pretty stable until about 2 weeks ot a month ago. This was after decreasing prednisone to 6 mg daily dose. Knee pain limits her mobility but hands have the most increase in swelling. Skin rash on the back of her neck and throughout her low back. She tried topical hydrocortisone without a large improvement so far.   Previous HPI 01/23/21 Ebony Scott is a 71 y.o. female here for follow up for seronegative RA minimally responsive to multiple DMARD treatments on prednisone 10 mg PO daily with acquired cushing's syndrome. She has noticed some increase in right hand MCP joint swelling but overall joints not dramatically changed with dose reduction. Since our visit she went for coronary artery stent placement after study for evaluating cardiomyopathy found significant stenosis. No anginal symptoms, she is not sure if any different but husband reports noticing some improvement in her dyspnea on exertion. She has had numerous other issues increased upper airway congestion and drainage. Sometimes productive coughing with this. She has had mild nasal bleeding with rhinitis and and sneezing issues.   Previous HPI 11/28/20 Ebony Scott is a 71 y.o. female here for iatrogenic cushing syndrome and history of rheumatoid arthritis with long term prednisone treatment, previously seen by Dr. Kathlene November at [facility] for this problem. Mostly had been taking treatments after hospitalization with severe joint inflammation in 2020. She has tried  taking numerous DMARD treatments for years including actemra, simponi, abatacept, and rituximab so far without great response or tolerance. Chronic prednisone required for symptoms throughout this time but has taken these longstanding for asthma disease already. She has had some issue with recurrent infections with pulmonary and septic bursitis and more recently noninfectious allergy symptoms managed with Dr. Neldon Mc and  Erin Fulling. She started on IVIG treatments with Dr. Neldon Mc for this identified recently, not sure if primary or related to previous medication   DMARD Hx Rituximab Abatacept Simponi Actemra IV   Review of Systems  Constitutional:  Positive for fatigue.  HENT:  Positive for mouth sores and mouth dryness.   Eyes:  Positive for dryness.  Respiratory:  Positive for shortness of breath.   Cardiovascular:  Negative for chest pain and palpitations.  Gastrointestinal:  Negative for blood in stool, constipation and diarrhea.  Endocrine: Positive for increased urination.  Genitourinary:  Negative for involuntary urination.  Musculoskeletal:  Positive for joint pain, gait problem, joint pain, joint swelling, myalgias, muscle weakness, morning stiffness, muscle tenderness and myalgias.  Skin:  Positive for hair loss. Negative for color change, rash and sensitivity to sunlight.  Allergic/Immunologic: Positive for susceptible to infections.  Neurological:  Negative for dizziness and headaches.  Hematological:  Negative for swollen glands.  Psychiatric/Behavioral:  Positive for sleep disturbance. Negative for depressed mood. The patient is not nervous/anxious.     PMFS History:  Patient Active Problem List   Diagnosis Date Noted   Effusion, left knee 08/09/2021   Noninfectious otitis externa of left ear 06/08/2021   Rash and other nonspecific skin eruption 04/28/2021   Iron deficiency anemia 02/24/2021   GAVE (gastric antral vascular ectasia)    Symptomatic anemia 02/04/2021   Anticoagulated    Antiplatelet or antithrombotic long-term use    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[place])  01/11/2021   Coronary artery calcification seen on CAT scan 01/11/2021   ARF (acute renal failure) ([place]) 10/04/2020   Physical deconditioning 09/25/2020   History of DVT (deep vein thrombosis) 09/24/2020   DMII (diabetes mellitus, type 2) ([place]) 09/23/2020   Cellulitis of right hand 09/22/2020   Abscess of dorsum  of right hand 09/22/2020   Hoarseness 17/40/8144   Metabolic encephalopathy 81/85/6314   Acute encephalopathy    AMS (altered mental status) 02/23/2020   S/P reverse total shoulder arthroplasty, right    Hypokalemia    DOE (dyspnea on exertion) 02/18/2020   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   S/P reverse total shoulder arthroplasty, left 02/16/2020   Preoperative clearance 01/26/2020   Closed displaced fracture of metatarsal bone of right foot 01/20/2019   History of pulmonary embolus (PE) 12/19/2018   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   Rheumatoid arthritis ([place])    Bilateral impacted cerumen 08/18/2018   Fungal otitis externa 08/18/2018   Cerumen debris on tympanic membrane of right ear 08/18/2018   Asthmatic bronchitis with exacerbation 07/29/2018   Pulmonary embolism ([place]) 07/29/2018   Cellulitis of forearm    Fever of unknown origin (FUO) 02/10/2018   Abdominal pain 02/10/2018   Fracture of base of fifth metatarsal bone with routine healing, left    Closed fracture of fifth metatarsal bone of right foot, initial encounter 01/15/2018   DVT (deep venous thrombosis) ([place]) 01/14/2018   Hypothyroidism 01/14/2018   Depression 01/14/2018   Debility    Malnutrition of moderate degree 12/17/2017   Knee pain    History of breast cancer    Chronic pain syndrome    Fibromyalgia  Graves disease    Vasculitis ([place]) 12/13/2017   Rhabdomyolysis 12/13/2017   Dysphonia 08/10/2017   Pulmonary nodules    Chronic kidney disease, stage 3b ([place]) 04/15/2016   Hx of adenomatous colonic polyps 04/12/2016   Cough 02/09/2016   Opacity of lung on imaging study 11/03/2015   Severe persistent asthma 02/08/2015   Facial pain syndrome 02/08/2015   Insomnia 02/08/2015   Other allergic rhinitis 02/08/2015   LPRD (laryngopharyngeal reflux disease) 02/08/2015   Chronic combined systolic and diastolic CHF (congestive heart failure) ([place]) 02/08/2015   Benign paroxysmal positional vertigo  12/03/2012   Dizziness and giddiness 10/29/2012   Disequilibrium 10/29/2012   Pseudogout 02/24/2012   Pneumonia 05/31/2011   Irritable bowel syndrome 01/02/2010   Hyperlipidemia 12/23/2009   Hyperthyroidism 11/12/2006   Seasonal and perennial allergic rhinitis 11/12/2006   GERD 11/12/2006   NECK PAIN, CHRONIC 11/12/2006   OSTEOPOROSIS 11/12/2006   BREAST CANCER, HX OF 11/12/2006    Past Medical History:  Diagnosis Date   Abscess of dorsum of right hand 09/22/2020   Acute deep vein thrombosis (DVT) of right lower extremity ([place]) 12/13/2017   Allergic rhinitis    Anemia    Angio-edema    Anxiety    pt denies dx   Arthritis    Phreesia 10/21/2019   Asthma    Phreesia 10/21/2019   Breast cancer ([place]) 1998   Left breast, in remission   Cataract    REMOVED   CHF (congestive heart failure) ([place])    Clostridium difficile colitis 85/63/1497   Complication of anesthesia    "had hard time waking up from it several times" (02/20/2012), no anesthesia problems since 2014   Coronary artery disease    Depression    "some; don't take anything for it" (02/20/2012), pt denies dx as of 06/02/21   Diverticulosis    DVT (deep venous thrombosis) ([place])    Dyspnea    all the time   Fibromyalgia 11/2011   GERD (gastroesophageal reflux disease)    Graves disease    Headache(784.0)    "related to allergies; more at different times during the year" (02/20/2012)   Hemorrhoids    Hiatal hernia    back and neck   History of blood transfusion 2023   1 unit, 5 units of Iron   Hx of adenomatous colonic polyps 04/12/2016   Hypercholesteremia    good cholesterol is high   Hypothyroidism    IBS (irritable bowel syndrome)    Moderate persistent asthma    -FeV1 72% 2011, -IgE 102 2011, CT sinus Neg 2011   Osteomyelitis of second toe of right foot ([place]) 09/23/2020   Osteoporosis    on reclast yearly   Peripheral vascular disease ([place]) 2019   DVTs (3) lungs and leg   Pneumonia 04/2011; ~ 11/2011    "double; single" (02/20/2012)   Septic olecranon bursitis of right elbow 09/22/2020   Seronegative rheumatoid arthritis ([place])    Dr. Lahoma Rocker   SIRS (systemic inflammatory response syndrome) ([place]) 02/10/2018   Sleep apnea    uses cpap nightly   Tracheobronchomalacia     Family History  Problem Relation Age of Onset   Allergies Mother    Heart disease Mother    Arthritis Mother    Lung cancer Mother        heavy smoker   Diabetes Mother    Allergies Father    Heart disease Father    Arthritis Father    Stroke Father  Heart disease Brother    Diabetes Maternal Grandfather    Colitis Daughter    Colon cancer Other        Maternal half uncle   Past Surgical History:  Procedure Laterality Date   ABDOMINAL HYSTERECTOMY N/A    Phreesia 10/21/2019   ANTERIOR AND POSTERIOR REPAIR  1990's   APPENDECTOMY     ARGON LASER APPLICATION  33/35/4562   Procedure: ARGON LASER APPLICATION;  Surgeon: Yetta Flock, MD;  Location: [facility];  Service: Gastroenterology;;   BREAST LUMPECTOMY  1998   left   BREAST SURGERY N/A    Phreesia 10/21/2019   BRONCHIAL WASHINGS  04/05/2020   Procedure: BRONCHIAL WASHINGS;  Surgeon: Freddi Starr, MD;  Location: [facility];  Service: Pulmonary;;   CAPSULOTOMY Right 08/28/2021   Procedure: CAPSULOTOMY;  Surgeon: Felipa Furnace, DPM;  Location: [facility];  Service: Podiatry;  Laterality: Right;   CARPOMETACARPEL ([place]) FUSION OF THUMB WITH AUTOGRAFT FROM RADIUS  ~ 2009   "both thumbs" (02/20/2012)   CATARACT EXTRACTION W/ INTRAOCULAR LENS  IMPLANT, BILATERAL  2012   CERVICAL DISCECTOMY  10/2001   C5-C6   CERVICAL FUSION  2003   C3-C4   CHOLECYSTECTOMY     COLONOSCOPY     CORONARY STENT INTERVENTION N/A 01/11/2021   Procedure: CORONARY STENT INTERVENTION;  Surgeon: Leonie Man, MD;  Location: [facility];  Service: Cardiovascular;  Laterality: N/A;   DEBRIDEMENT TENNIS ELBOW  ?1970's   right   ESOPHAGOGASTRODUODENOSCOPY      ESOPHAGOGASTRODUODENOSCOPY (EGD) WITH PROPOFOL N/A 02/05/2021   Procedure: ESOPHAGOGASTRODUODENOSCOPY (EGD) WITH PROPOFOL;  Surgeon: Yetta Flock, MD;  Location: [facility];  Service: Gastroenterology;  Laterality: N/A;   EYE SURGERY N/A    Phreesia 10/21/2019   HAMMER TOE SURGERY Right 08/28/2021   Procedure: HAMMER TOE REPAIR 2ND TOE RIGHT FOOT;  Surgeon: Felipa Furnace, DPM;  Location: [facility];  Service: Podiatry;  Laterality: Right;   HYSTERECTOMY     I & D EXTREMITY Right 09/22/2020   Procedure: IRRIGATION AND DEBRIDEMENT RIGHT HAND AND ELBOW;  Surgeon: Marchia Bond, MD;  Location: [facility];  Service: Orthopedics;  Laterality: Right;   KNEE ARTHROPLASTY  ?1990's   "?right; w/cartilage repair" (02/20/2012)   MASS EXCISION Left 08/28/2021   Procedure: EXCISION OF SOFT TISSUE  MASS LEFT FOOT;  Surgeon: Felipa Furnace, DPM;  Location: [facility];  Service: Podiatry;  Laterality: Left;   NASAL SEPTUM SURGERY  1980's   POSTERIOR CERVICAL FUSION/FORAMINOTOMY  2004   "failed initial fusion; rewired  anterior neck" (02/20/2012)   REVERSE SHOULDER ARTHROPLASTY Right 02/16/2020   Procedure: REVERSE SHOULDER ARTHROPLASTY;  Surgeon: Marchia Bond, MD;  Location: [facility];  Service: Orthopedics;  Laterality: Right;   RIGHT/LEFT HEART CATH AND CORONARY ANGIOGRAPHY N/A 01/11/2021   Procedure: RIGHT/LEFT HEART CATH AND CORONARY ANGIOGRAPHY;  Surgeon: Leonie Man, MD;  Location: [facility];  Service: Cardiovascular;  Laterality: N/A;   SPINE SURGERY N/A    Phreesia 10/21/2019   TONSILLECTOMY  ~ 1953   VESICOVAGINAL FISTULA CLOSURE W/ TAH  1988   VIDEO BRONCHOSCOPY Bilateral 08/23/2016   Procedure: VIDEO BRONCHOSCOPY WITH FLUORO;  Surgeon: Javier Glazier, MD;  [facility];  Service: Cardiopulmonary;  Laterality: Bilateral;   VIDEO BRONCHOSCOPY N/A 04/05/2020   Procedure: VIDEO BRONCHOSCOPY WITHOUT FLUORO;  Surgeon: Freddi Starr, MD;  [facility];  Service:  Pulmonary;  Laterality: N/A;   Social History   Social History  Narrative   Patient lives at home alone. Patient  divorced.    Patient has her BS degree.   Right handed.   Caffeine- sometimes coffee.      [facility]:   Born in [place], [place]. She worked as a Copywriter, advertising. She has no pets currently. She does have indoor plants. Previously had mold in her home that was remediated. Carpet was removed.          Immunization History  Administered Date(s) Administered   DTaP 08/18/2013   Fluad Quad(high Dose 65+) 10/16/2018, 11/20/2019, 11/03/2020   Hepatitis A 09/04/2007, 03/02/2008   Hepatitis B 01/07/1985, 02/06/1985, 08/17/1985   Influenza Split 11/13/2010, 11/22/2011, 10/20/2012   Influenza Whole 11/14/2009, 11/21/2011   Influenza, High Dose Seasonal PF 11/09/2015, 11/27/2017   Influenza, Quadrivalent, Recombinant, Inj, Pf 11/20/2019   Influenza,inj,Quad PF,6+ Mos 11/06/2013, 11/09/2014, 11/06/2016   Influenza-Unspecified 11/06/2021   Meningococcal Conjugate 09/04/2007   PFIZER(Purple Top)SARS-COV-2 Vaccination 03/12/2019, 04/01/2019, 12/16/2019, 06/29/2020   Pfizer Covid-19 Vaccine Bivalent Booster 33yr & up 03/02/2021   Pneumococcal Conjugate-13 05/18/2013   Pneumococcal Polysaccharide-23 01/05/1994, 11/28/2011, 05/27/2017   Respiratory Syncytial Virus Vaccine,Recomb Aduvanted(Arexvy) 11/06/2021   Td 07/24/1995, 03/16/2005   Tdap 08/18/2013   Zoster Recombinat (Shingrix) 09/18/2017   Zoster, Live 03/02/2008     Objective: Vital Signs: BP 129/68 (BP Location: Right Arm, Patient Position: Sitting, Cuff Size: Normal)   Pulse 69   Resp 15   Ht _0  (1.549 m)   Wt 141 lb 9.6 oz (64.2 kg)   BMI 26.76 kg/m    Physical Exam Cardiovascular:     Rate and Rhythm: Normal rate and regular rhythm.  Pulmonary:     Breath sounds: Wheezing and rhonchi present.  Musculoskeletal:     Right lower leg: No edema.     Left lower leg: No edema.  Lymphadenopathy:      Cervical: No cervical adenopathy.  Skin:    General: Skin is warm and dry.     Findings: Bruising present. No rash.  Neurological:     Mental Status: She is alert.  Psychiatric:        Mood and Affect: Mood normal.      Musculoskeletal Exam:  Neck full ROM no tenderness Shoulders full ROM no tenderness or swelling Elbows full ROM no tenderness or swelling Wrists full ROM no tenderness or swelling Fingers chronic nodule and lateral deviations most severe right 5th PIP, synovitis and tenderness of right 4th PIP and left 2nd and 4th PIP Knees patellofemoral crepitus, ROM slightly restricted but minimal pain, no palpable effusion   Investigation: No additional findings.  Imaging: No results found.  Recent Labs: Lab Results  Component Value Date   WBC 8.0 10/19/2021   HGB 10.2 (L) 10/19/2021   PLT 261 10/19/2021   NA 140 09/19/2021   K 4.4 09/19/2021   CL 106 09/19/2021   CO2 26 09/19/2021   GLUCOSE 177 (H) 09/19/2021   BUN 35 (H) 09/19/2021   CREATININE 1.72 (H) 09/19/2021   BILITOT 0.5 09/19/2021   ALKPHOS 49 09/19/2021   AST 40 09/19/2021   ALT 38 09/19/2021   PROT 7.2 09/19/2021   ALBUMIN 3.8 09/19/2021   CALCIUM 8.8 (L) 09/19/2021   GFRAA 40 (L) 08/25/2020    Speciality Comments: No specialty comments available.  Procedures:  No procedures performed Allergies: Baclofen, Dust mite extract, Molds & smuts, Morphine and related, Other, Penicillins, Rofecoxib, Shellfish allergy, Tetracycline hcl, Xolair [omalizumab], Zoledronic acid, Dilaudid [hydromorphone hcl], Levofloxacin, Oxycodone hcl,  Paroxetine, Celecoxib, Diltiazem hcl, Hydrocodone-acetaminophen, Lactose intolerance (gi), Rituximab, Tree extract, Gamunex [immune globulin], and Penicillin g procaine   Assessment / Plan:     Visit Diagnoses: Rheumatoid arthritis with negative rheumatoid factor, involving unspecified site ([place])  Fibromyalgia - Plan: gabapentin (NEURONTIN) 300 MG capsule  Pain worse in  multiple areas probably increased joint inflammation due to recent infection still ongoing treatment. Not a great candidate for adding DMARD treatment at this time while on replacement for immune deficiency. Already on very high prednisone dose taper so no additional injection recommended. Goal would be to titrate back down previously tolerated 7 mg daily dose for arthritis symptoms. She is not interested in increasing tramadol or other narcotic pain medication. Previously tolerated gabapentin 600 mg TID this was reduced due to concern of dizziness or drowsiness side effect. Recommend trying to increase to 300 mg dose for now try increase at night first and monitor for side effects.  Effusion, left knee  Appears somewhat improved right now possibly with recent injection series or from high dose steroid for asthma exacerbation.   Orders: No orders of the defined types were placed in this encounter.  Meds ordered this encounter  Medications   gabapentin (NEURONTIN) 300 MG capsule    Sig: Take 1 capsule (300 mg total) by mouth 3 (three) times daily as needed.    Dispense:  90 capsule    Refill:  0    Taking in place of 100 mg dose     Follow-Up Instructions: Return in about 3 months (around 03/01/2022) for RA/OA on GC/gabapentin f/u 28mo.   CCollier Salina MD  Note - This record has been created using [employer]  Chart creation errors have been sought, but may not always  have been located. Such creation errors do not reflect on  the standard of medical care.

## 2021-11-23 DIAGNOSIS — N1832 Chronic kidney disease, stage 3b: Secondary | ICD-10-CM | POA: Diagnosis not present

## 2021-11-23 DIAGNOSIS — I509 Heart failure, unspecified: Secondary | ICD-10-CM | POA: Diagnosis not present

## 2021-11-23 DIAGNOSIS — M109 Gout, unspecified: Secondary | ICD-10-CM | POA: Diagnosis not present

## 2021-11-23 DIAGNOSIS — D631 Anemia in chronic kidney disease: Secondary | ICD-10-CM | POA: Diagnosis not present

## 2021-11-24 ENCOUNTER — Telehealth: Payer: Medicare PPO | Admitting: Physician Assistant

## 2021-11-24 ENCOUNTER — Telehealth: Payer: Self-pay | Admitting: Pulmonary Disease

## 2021-11-24 DIAGNOSIS — B9689 Other specified bacterial agents as the cause of diseases classified elsewhere: Secondary | ICD-10-CM

## 2021-11-24 DIAGNOSIS — J208 Acute bronchitis due to other specified organisms: Secondary | ICD-10-CM

## 2021-11-24 MED ORDER — AZITHROMYCIN 250 MG PO TABS: ORAL_TABLET | ORAL | 0 refills | Status: AC

## 2021-11-24 MED ORDER — BUDESONIDE 1 MG/2ML IN SUSP: 1.0000 mg | Freq: Every day | RESPIRATORY_TRACT | 0 refills | Status: DC

## 2021-11-24 MED ORDER — PREDNISONE 10 MG PO TABS: ORAL_TABLET | ORAL | 0 refills | Status: DC

## 2021-11-24 NOTE — Telephone Encounter (Signed)
No better with neb =>  go to ER  - no other options for telephone care at this point as already on max rx including systemic steroids

## 2021-11-24 NOTE — Progress Notes (Signed)
Virtual Visit Consent   Ebony Scott, you are scheduled for a virtual visit with a [facility] provider today. Just as with appointments in the office, your consent must be obtained to participate. Your consent will be active for this visit and any virtual visit you may have with one of our providers in the next 365 days. If you have a MyChart account, a copy of this consent can be sent to you electronically.  As this is a virtual visit, video technology does not allow for your provider to perform a traditional examination. This may limit your provider's ability to fully assess your condition. If your provider identifies any concerns that need to be evaluated in person or the need to arrange testing (such as labs, EKG, etc.), we will make arrangements to do so. Although advances in technology are sophisticated, we cannot ensure that it will always work on either your end or our end. If the connection with a video visit is poor, the visit may have to be switched to a telephone visit. With either a video or telephone visit, we are not always able to ensure that we have a secure connection.  By engaging in this virtual visit, you consent to the provision of healthcare and authorize for your insurance to be billed (if applicable) for the services provided during this visit. Depending on your insurance coverage, you may receive a charge related to this service.  I need to obtain your verbal consent now. Are you willing to proceed with your visit today? Ebony Scott has provided verbal consent on 11/24/2021 for a virtual visit (video or telephone). Mar Daring, PA-C  Date: 11/24/2021 4:12 PM  Virtual Visit via Video Note   I, Mar Daring, connected with  Ebony Scott  ([id number], 05/28/1950) on 11/24/21 at  3:00 PM EDT by a video-enabled telemedicine application and verified that I am speaking with the correct person using two identifiers.  Location: Patient: Virtual Visit Location Patient:  Home Provider: Virtual Visit Location Provider: Home Office   I discussed the limitations of evaluation and management by telemedicine and the availability of in person appointments. The patient expressed understanding and agreed to proceed.    History of Present Illness: Ebony Scott is a 71 y.o. who identifies as a female who was assigned female at birth, and is being seen today for cough and wheezing.  HPI: Cough This is a new problem. The current episode started 1 to 4 weeks ago. The problem has been gradually worsening. The problem occurs constantly. The cough is Productive of sputum and productive of purulent sputum. Associated symptoms include chest pain (tightness), shortness of breath and wheezing. The symptoms are aggravated by lying down. She has tried a beta-agonist inhaler, oral steroids, OTC cough suppressant, prescription cough suppressant, steroid inhaler, leukotriene antagonists and ipratropium inhaler (nebulizer treatments, spirometer, vibrating vest) for the symptoms. The treatment provided no relief. Her past medical history is significant for asthma, bronchitis and pneumonia.    Pulse oximeter at home is reading 97%  Problems:  Patient Active Problem List   Diagnosis Date Noted   Effusion, left knee 08/09/2021   Noninfectious otitis externa of left ear 06/08/2021   Rash and other nonspecific skin eruption 04/28/2021   Iron deficiency anemia 02/24/2021   GAVE (gastric antral vascular ectasia)    Symptomatic anemia 02/04/2021   Anticoagulated    Antiplatelet or antithrombotic long-term use    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[place])  01/11/2021   Coronary  artery calcification seen on CAT scan 01/11/2021   ARF (acute renal failure) ([place]) 10/04/2020   Physical deconditioning 09/25/2020   History of DVT (deep vein thrombosis) 09/24/2020   DMII (diabetes mellitus, type 2) ([place]) 09/23/2020   Cellulitis of right hand 09/22/2020   Abscess of dorsum of right hand 09/22/2020    Hoarseness 28/00/3491   Metabolic encephalopathy 79/15/0569   Acute encephalopathy    AMS (altered mental status) 02/23/2020   S/P reverse total shoulder arthroplasty, right    Hypokalemia    DOE (dyspnea on exertion) 02/18/2020   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   S/P reverse total shoulder arthroplasty, left 02/16/2020   Preoperative clearance 01/26/2020   Closed displaced fracture of metatarsal bone of right foot 01/20/2019   History of pulmonary embolus (PE) 12/19/2018   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   Rheumatoid arthritis ([place])    Bilateral impacted cerumen 08/18/2018   Fungal otitis externa 08/18/2018   Cerumen debris on tympanic membrane of right ear 08/18/2018   Asthmatic bronchitis with exacerbation 07/29/2018   Pulmonary embolism ([place]) 07/29/2018   Cellulitis of forearm    Fever of unknown origin (FUO) 02/10/2018   Abdominal pain 02/10/2018   Fracture of base of fifth metatarsal bone with routine healing, left    Closed fracture of fifth metatarsal bone of right foot, initial encounter 01/15/2018   DVT (deep venous thrombosis) ([place]) 01/14/2018   Hypothyroidism 01/14/2018   Depression 01/14/2018   Debility    Malnutrition of moderate degree 12/17/2017   Knee pain    History of breast cancer    Chronic pain syndrome    Fibromyalgia    Graves disease    Vasculitis ([place]) 12/13/2017   Rhabdomyolysis 12/13/2017   Dysphonia 08/10/2017   Pulmonary nodules    Chronic kidney disease, stage 3b ([place]) 04/15/2016   Hx of adenomatous colonic polyps 04/12/2016   Cough 02/09/2016   Opacity of lung on imaging study 11/03/2015   Severe persistent asthma 02/08/2015   Facial pain syndrome 02/08/2015   Insomnia 02/08/2015   Other allergic rhinitis 02/08/2015   LPRD (laryngopharyngeal reflux disease) 02/08/2015   Chronic combined systolic and diastolic CHF (congestive heart failure) ([place]) 02/08/2015   Benign paroxysmal positional vertigo 12/03/2012   Dizziness and  giddiness 10/29/2012   Disequilibrium 10/29/2012   Pseudogout 02/24/2012   Pneumonia 05/31/2011   Irritable bowel syndrome 01/02/2010   Hyperlipidemia 12/23/2009   Hyperthyroidism 11/12/2006   Seasonal and perennial allergic rhinitis 11/12/2006   GERD 11/12/2006   NECK PAIN, CHRONIC 11/12/2006   OSTEOPOROSIS 11/12/2006   BREAST CANCER, HX OF 11/12/2006    Allergies:  Allergies  Allergen Reactions   Baclofen Other (See Comments)    Altered mental status requiring a 3-day hospital stay- patient became unresponsive   Dust Mite Extract Shortness Of Breath and Other (See Comments)    "sneezing" (02/20/2012)   Molds & Smuts Shortness Of Breath   Morphine And Related Hives and Itching   Other Shortness Of Breath and Other (See Comments)    Grass and weeds "sneezing; filled sinuses" (02/20/2012)   Penicillins Rash and Other (See Comments)    "welts" (02/20/2012) Tolerated Ancef 02/16/20.    Rofecoxib Swelling and Other (See Comments)    Vioxx- feet swelling    Shellfish Allergy Anaphylaxis, Shortness Of Breath, Itching and Rash   Tetracycline Hcl Nausea And Vomiting   Xolair [Omalizumab] Other (See Comments)    Caused Blood clot   Zoledronic Acid Other (See Comments)    Fever, Put  in hospital, dr said it was a reaction from a reaction  Other reaction(s): Unknown   Dilaudid [Hydromorphone Hcl] Itching   Levofloxacin Other (See Comments)    GI upset   Oxycodone Hcl Nausea And Vomiting   Paroxetine Nausea And Vomiting and Other (See Comments)    Paxil    Celecoxib Swelling and Other (See Comments)    Feet swelling   Diltiazem Hcl Swelling   Hydrocodone-Acetaminophen Itching   Lactose Intolerance (Gi) Other (See Comments)    Bloating and gas   Rituximab     Anemia    Tree Extract Other (See Comments)    "tested and told I was allergic to it; never experienced a reaction to it" (02/20/2012)   Gamunex [Immune Globulin] Itching and Rash    Tolerates hizentra    Penicillin G  Procaine Rash and Other (See Comments)    Welts, also   Medications:  Current Outpatient Medications:    azithromycin (ZITHROMAX) 250 MG tablet, Take 2 tablets on day 1, then 1 tablet daily on days 2 through 5, Disp: 6 tablet, Rfl: 0   budesonide (PULMICORT) 1 MG/2ML nebulizer solution, Take 2 mLs (1 mg total) by nebulization daily., Disp: 60 mL, Rfl: 0   predniSONE (DELTASONE) 10 MG tablet, Take 6 tablets PO on day 1 and day 2, take 5 tablets PO on day 3 and day 4, take 4 tablets PO on day 5 and day 6, take 3 tablets PO on day 7 and day 8, take 2 tablets PO on day 9 and day 10, take one tablet PO on day 11 and day 12., Disp: 42 tablet, Rfl: 0   acetaminophen (TYLENOL) 650 MG CR tablet, Take 1,300 mg by mouth every 8 (eight) hours as needed for pain., Disp: , Rfl:    albuterol (VENTOLIN HFA) 108 (90 Base) MCG/ACT inhaler, Inhale 2 puffs into the lungs every 6 (six) hours as needed for wheezing or shortness of breath., Disp: 19 g, Rfl: 1   allopurinol (ZYLOPRIM) 100 MG tablet, Take 1 tablet (100 mg total) by mouth daily., Disp: 90 tablet, Rfl: 3   Alpha-D-Galactosidase (BEANO PO), Take 2 tablets by mouth 3 (three) times daily with meals., Disp: , Rfl:    Alpha-Lipoic Acid 600 MG TABS, Take 600 mg by mouth daily., Disp: , Rfl:    atorvastatin (LIPITOR) 80 MG tablet, Take 1 tablet (80 mg total) by mouth daily., Disp: 90 tablet, Rfl: 3   azelastine (ASTELIN) 0.1 % nasal spray, Place 2 sprays into both nostrils daily. Use in each nostril as directed (Patient taking differently: Place 2 sprays into both nostrils at bedtime as needed for rhinitis. Use in each nostril as directed), Disp: 30 mL, Rfl: 5   benzonatate (TESSALON) 200 MG capsule, TAKE 1 CAPSULE BY MOUTH THREE TIMES DAILY AS NEEDED FOR COUGH, Disp: 30 capsule, Rfl: 0   Calcium-Magnesium-Vitamin D (CALCIUM MAGNESIUM PO), Take 1 tablet by mouth daily., Disp: , Rfl:    cetirizine (ZYRTEC) 10 MG tablet, Take 1 tablet (10 mg total) by mouth 2 (two)  times daily as needed for allergies (Can take a n extra dose during flare ups.). (Patient taking differently: Take 10 mg by mouth every evening.), Disp: 60 tablet, Rfl: 5   cholecalciferol (VITAMIN D) 25 MCG (1000 UNIT) tablet, Take 1,000 Units by mouth every evening., Disp: , Rfl:    clopidogrel (PLAVIX) 75 MG tablet, Take 1 tablet (75 mg total) by mouth daily., Disp: 90 tablet, Rfl: 3  cyproheptadine (PERIACTIN) 4 MG tablet, Take 2 tablets (8 mg total) by mouth at bedtime. Take 2 tablets at bedtime as needed to treat facial pain or sleep dysfunction. (Patient taking differently: Take 8 mg by mouth at bedtime.), Disp: 60 tablet, Rfl: 5   denosumab (PROLIA) 60 MG/ML SOSY injection, Inject 60 mg into the skin every 6 (six) months., Disp: , Rfl:    dexlansoprazole (DEXILANT) 60 MG capsule, Take 1 capsule (60 mg total) by mouth daily., Disp: 90 capsule, Rfl: 3   diclofenac Sodium (VOLTAREN) 1 % GEL, Apply 1 Application topically 4 (four) times daily as needed (pain)., Disp: 100 g, Rfl: 3   empagliflozin (JARDIANCE) 10 MG TABS tablet, Take 1 tablet (10 mg total) by mouth daily before breakfast., Disp: 30 tablet, Rfl: 5   EPINEPHrine 0.3 mg/0.3 mL IJ SOAJ injection, USE AS DIRECTED BY YOUR PHYSICIAN INTRAMUSCULARLY AS NEEDED FOR ANAPHYLAXIS, Disp: 2 each, Rfl: 1   famotidine (PEPCID) 20 MG tablet, Take 1 tablet (20 mg total) by mouth at bedtime., Disp: 30 tablet, Rfl: 5   fluconazole (DIFLUCAN) 100 MG tablet, Take 1 tablet (100 mg total) by mouth daily., Disp: 3 tablet, Rfl: 0   fluticasone (FLONASE) 50 MCG/ACT nasal spray, USE 2 SPRAYS IN EACH NOSTRIL DAILY (Patient taking differently: Place 2 sprays into both nostrils daily as needed for allergies.), Disp: 16 g, Rfl: 2   folic acid (FOLVITE) 1 MG tablet, Take 1 tablet (1 mg total) by mouth daily., Disp: 90 tablet, Rfl: 3   furosemide (LASIX) 40 MG tablet, Take 1 tablet (40 mg total) by mouth daily., Disp: 90 tablet, Rfl: 2   gabapentin (NEURONTIN) 100 MG  capsule, Take 1 capsule (100 mg total) by mouth 3 (three) times daily., Disp: 90 capsule, Rfl: 3   guaiFENesin (MUCINEX) 600 MG 12 hr tablet, Take 1 tablet (600 mg total) by mouth 2 (two) times daily as needed for cough or to loosen phlegm. (Patient taking differently: Take 600 mg by mouth 2 (two) times daily.), Disp: 60 tablet, Rfl: 1   HIZENTRA 1 GM/5ML SOLN, Hizentra 3 times/5 ml = 65 ml total once weekly on Fridays, Disp: , Rfl:    ipratropium-albuterol (DUONEB) 0.5-2.5 (3) MG/3ML SOLN, Take 3 mLs by nebulization every 6 (six) hours as needed., Disp: 360 mL, Rfl: 6   isosorbide-hydrALAZINE (BIDIL) 20-37.5 MG tablet, TAKE 1 TABLET BY MOUTH TWICE DAILY, Disp: 60 tablet, Rfl: 1   Lactase (LACTOSE FAST ACTING RELIEF PO), Take 3 tablets by mouth 3 (three) times daily., Disp: , Rfl:    lidocaine-prilocaine (EMLA) cream, Apply 1 application  topically as needed (pain)., Disp: , Rfl:    magnesium oxide (MAG-OX) 400 (240 Mg) MG tablet, Take 400 mg by mouth daily., Disp: , Rfl:    Menthol, Topical Analgesic, (BIOFREEZE) 4 % GEL, Apply 1 application. topically daily as needed (pain)., Disp: , Rfl:    mirtazapine (REMERON SOL-TAB) 30 MG disintegrating tablet, Take 1 tablet (30 mg total) by mouth at bedtime as needed (sleep)., Disp: 90 tablet, Rfl: 3   montelukast (SINGULAIR) 10 MG tablet, Take 1 tablet (10 mg total) by mouth daily., Disp: 30 tablet, Rfl: 5   Nystatin (GERHARDT'S BUTT CREAM) CREA, Apply topically 3 times a day as needed for itching or irritation, Disp: 4 each, Rfl: 6   nystatin (MYCOSTATIN) 100000 UNIT/ML suspension, Use as directed 15 mLs in the mouth or throat at bedtime as needed (mouth sores)., Disp: , Rfl:    nystatin-triamcinolone ointment (MYCOLOG), Apply  1 Application topically 2 (two) times daily as needed (vaginal irritation)., Disp: , Rfl:    potassium chloride SA (KLOR-CON M) 20 MEQ tablet, Take 20 mEq by mouth daily., Disp: , Rfl:    predniSONE (DELTASONE) 1 MG tablet, Take 2  tablets (2 mg total) by mouth daily. Total of 7 mg daily., Disp: 180 tablet, Rfl: 0   predniSONE (DELTASONE) 5 MG tablet, Take 1 tablet (5 mg total) by mouth daily with breakfast. Along with 2 mg tablets for total daily dose 7 mg total daily, Disp: 90 tablet, Rfl: 0   Prenatal Vit-Fe Fumarate-FA (PRENATAL VITAMINS PO), Take 1 tablet by mouth daily., Disp: , Rfl:    Probiotic Product (ALIGN PO), Take 1 capsule by mouth daily., Disp: , Rfl:    Propylene Glycol (SYSTANE COMPLETE) 0.6 % SOLN, Place 1 drop into both eyes 2 (two) times daily., Disp: 15 mL, Rfl: 5   Tiotropium Bromide-Olodaterol (STIOLTO RESPIMAT) 2.5-2.5 MCG/ACT AERS, Inhale 2 puffs into the lungs daily. (Patient taking differently: Inhale 2 puffs into the lungs every evening.), Disp: 4 g, Rfl: 11   traMADol (ULTRAM) 50 MG tablet, Take 1 tablet (50 mg total) by mouth at bedtime., Disp: 30 tablet, Rfl: 2   valsartan (DIOVAN) 80 MG tablet, Take 1 tablet (80 mg total) by mouth daily., Disp: 90 tablet, Rfl: 3   XARELTO 20 MG TABS tablet, TAKE 1 TABLET BY MOUTH DAILY WITH SUPPER, Disp: 90 tablet, Rfl: 3 No current facility-administered medications for this visit.  Facility-Administered Medications Ordered in Other Visits:    iohexol (OMNIPAQUE) 350 MG/ML injection, , , PRN, Leonie Man, MD, 105 mL at 01/11/21 0955  Observations/Objective: Patient is well-developed, well-nourished in no acute distress.  Resting comfortably at home.  Head is normocephalic, atraumatic.  No labored breathing.  Speech is clear and coherent with logical content.  Patient is alert and oriented at baseline.  Home health nurse reports rhonchi throughout lung fields bilaterally Frequent coughing that is deep and bronchial heard frequently, patient having to take deep breaths frequently but speech is intact with complete sentences  Assessment and Plan: 1. Acute bacterial bronchitis - budesonide (PULMICORT) 1 MG/2ML nebulizer solution; Take 2 mLs (1 mg  total) by nebulization daily.  Dispense: 60 mL; Refill: 0 - predniSONE (DELTASONE) 10 MG tablet; Take 6 tablets PO on day 1 and day 2, take 5 tablets PO on day 3 and day 4, take 4 tablets PO on day 5 and day 6, take 3 tablets PO on day 7 and day 8, take 2 tablets PO on day 9 and day 10, take one tablet PO on day 11 and day 12.  Dispense: 42 tablet; Refill: 0 - azithromycin (ZITHROMAX) 250 MG tablet; Take 2 tablets on day 1, then 1 tablet daily on days 2 through 5  Dispense: 6 tablet; Refill: 0  - Bacterial bronchitis suspected - Add azithromycin - Add Budesonide nebulizer treatments in the morning to Duoneb nebulizer (using Duoneb BID already) - Continue Stiolto respimat at bedtime - Hold daily prednisone ('7mg'$ ) and start 12 day prednisone taper; restart daily prednisone once completed - Continue all other medications as prescribed - Continue to monitor O2 saturations on pulse oximeter and if drops below 90% she needs to seek immediate care at the closest ER, she and daughter voiced understanding - Seek in person evaluation if symptoms worsen or fail to improve  Follow Up Instructions: I discussed the assessment and treatment plan with the patient. The patient was provided  an opportunity to ask questions and all were answered. The patient agreed with the plan and demonstrated an understanding of the instructions.  A copy of instructions were sent to the patient via MyChart unless otherwise noted below.    The patient was advised to call back or seek an in-person evaluation if the symptoms worsen or if the condition fails to improve as anticipated.  Time:  I spent 18 minutes with the patient via telehealth technology discussing the above problems/concerns.    Mar Daring, PA-C

## 2021-11-24 NOTE — Patient Instructions (Signed)
[name], thank you for joining Mar Daring, PA-C for today's virtual visit.  While this provider is not your primary care provider (PCP), if your PCP is located in our provider database this encounter information will be shared with them immediately following your visit.  Consent: (Patient) Ebony Scott provided verbal consent for this virtual visit at the beginning of the encounter.  Current Medications:  Current Outpatient Medications:    azithromycin (ZITHROMAX) 250 MG tablet, Take 2 tablets on day 1, then 1 tablet daily on days 2 through 5, Disp: 6 tablet, Rfl: 0   budesonide (PULMICORT) 1 MG/2ML nebulizer solution, Take 2 mLs (1 mg total) by nebulization daily., Disp: 60 mL, Rfl: 0   predniSONE (DELTASONE) 10 MG tablet, Take 6 tablets PO on day 1 and day 2, take 5 tablets PO on day 3 and day 4, take 4 tablets PO on day 5 and day 6, take 3 tablets PO on day 7 and day 8, take 2 tablets PO on day 9 and day 10, take one tablet PO on day 11 and day 12., Disp: 42 tablet, Rfl: 0   acetaminophen (TYLENOL) 650 MG CR tablet, Take 1,300 mg by mouth every 8 (eight) hours as needed for pain., Disp: , Rfl:    albuterol (VENTOLIN HFA) 108 (90 Base) MCG/ACT inhaler, Inhale 2 puffs into the lungs every 6 (six) hours as needed for wheezing or shortness of breath., Disp: 19 g, Rfl: 1   allopurinol (ZYLOPRIM) 100 MG tablet, Take 1 tablet (100 mg total) by mouth daily., Disp: 90 tablet, Rfl: 3   Alpha-D-Galactosidase (BEANO PO), Take 2 tablets by mouth 3 (three) times daily with meals., Disp: , Rfl:    Alpha-Lipoic Acid 600 MG TABS, Take 600 mg by mouth daily., Disp: , Rfl:    atorvastatin (LIPITOR) 80 MG tablet, Take 1 tablet (80 mg total) by mouth daily., Disp: 90 tablet, Rfl: 3   azelastine (ASTELIN) 0.1 % nasal spray, Place 2 sprays into both nostrils daily. Use in each nostril as directed (Patient taking differently: Place 2 sprays into both nostrils at bedtime as needed for rhinitis. Use in  each nostril as directed), Disp: 30 mL, Rfl: 5   benzonatate (TESSALON) 200 MG capsule, TAKE 1 CAPSULE BY MOUTH THREE TIMES DAILY AS NEEDED FOR COUGH, Disp: 30 capsule, Rfl: 0   Calcium-Magnesium-Vitamin D (CALCIUM MAGNESIUM PO), Take 1 tablet by mouth daily., Disp: , Rfl:    cetirizine (ZYRTEC) 10 MG tablet, Take 1 tablet (10 mg total) by mouth 2 (two) times daily as needed for allergies (Can take a n extra dose during flare ups.). (Patient taking differently: Take 10 mg by mouth every evening.), Disp: 60 tablet, Rfl: 5   cholecalciferol (VITAMIN D) 25 MCG (1000 UNIT) tablet, Take 1,000 Units by mouth every evening., Disp: , Rfl:    clopidogrel (PLAVIX) 75 MG tablet, Take 1 tablet (75 mg total) by mouth daily., Disp: 90 tablet, Rfl: 3   cyproheptadine (PERIACTIN) 4 MG tablet, Take 2 tablets (8 mg total) by mouth at bedtime. Take 2 tablets at bedtime as needed to treat facial pain or sleep dysfunction. (Patient taking differently: Take 8 mg by mouth at bedtime.), Disp: 60 tablet, Rfl: 5   denosumab (PROLIA) 60 MG/ML SOSY injection, Inject 60 mg into the skin every 6 (six) months., Disp: , Rfl:    dexlansoprazole (DEXILANT) 60 MG capsule, Take 1 capsule (60 mg total) by mouth daily., Disp: 90 capsule, Rfl: 3  diclofenac Sodium (VOLTAREN) 1 % GEL, Apply 1 Application topically 4 (four) times daily as needed (pain)., Disp: 100 g, Rfl: 3   empagliflozin (JARDIANCE) 10 MG TABS tablet, Take 1 tablet (10 mg total) by mouth daily before breakfast., Disp: 30 tablet, Rfl: 5   EPINEPHrine 0.3 mg/0.3 mL IJ SOAJ injection, USE AS DIRECTED BY YOUR PHYSICIAN INTRAMUSCULARLY AS NEEDED FOR ANAPHYLAXIS, Disp: 2 each, Rfl: 1   famotidine (PEPCID) 20 MG tablet, Take 1 tablet (20 mg total) by mouth at bedtime., Disp: 30 tablet, Rfl: 5   fluconazole (DIFLUCAN) 100 MG tablet, Take 1 tablet (100 mg total) by mouth daily., Disp: 3 tablet, Rfl: 0   fluticasone (FLONASE) 50 MCG/ACT nasal spray, USE 2 SPRAYS IN EACH NOSTRIL  DAILY (Patient taking differently: Place 2 sprays into both nostrils daily as needed for allergies.), Disp: 16 g, Rfl: 2   folic acid (FOLVITE) 1 MG tablet, Take 1 tablet (1 mg total) by mouth daily., Disp: 90 tablet, Rfl: 3   furosemide (LASIX) 40 MG tablet, Take 1 tablet (40 mg total) by mouth daily., Disp: 90 tablet, Rfl: 2   gabapentin (NEURONTIN) 100 MG capsule, Take 1 capsule (100 mg total) by mouth 3 (three) times daily., Disp: 90 capsule, Rfl: 3   guaiFENesin (MUCINEX) 600 MG 12 hr tablet, Take 1 tablet (600 mg total) by mouth 2 (two) times daily as needed for cough or to loosen phlegm. (Patient taking differently: Take 600 mg by mouth 2 (two) times daily.), Disp: 60 tablet, Rfl: 1   HIZENTRA 1 GM/5ML SOLN, Hizentra 3 times/5 ml = 65 ml total once weekly on Fridays, Disp: , Rfl:    ipratropium-albuterol (DUONEB) 0.5-2.5 (3) MG/3ML SOLN, Take 3 mLs by nebulization every 6 (six) hours as needed., Disp: 360 mL, Rfl: 6   isosorbide-hydrALAZINE (BIDIL) 20-37.5 MG tablet, TAKE 1 TABLET BY MOUTH TWICE DAILY, Disp: 60 tablet, Rfl: 1   Lactase (LACTOSE FAST ACTING RELIEF PO), Take 3 tablets by mouth 3 (three) times daily., Disp: , Rfl:    lidocaine-prilocaine (EMLA) cream, Apply 1 application  topically as needed (pain)., Disp: , Rfl:    magnesium oxide (MAG-OX) 400 (240 Mg) MG tablet, Take 400 mg by mouth daily., Disp: , Rfl:    Menthol, Topical Analgesic, (BIOFREEZE) 4 % GEL, Apply 1 application. topically daily as needed (pain)., Disp: , Rfl:    mirtazapine (REMERON SOL-TAB) 30 MG disintegrating tablet, Take 1 tablet (30 mg total) by mouth at bedtime as needed (sleep)., Disp: 90 tablet, Rfl: 3   montelukast (SINGULAIR) 10 MG tablet, Take 1 tablet (10 mg total) by mouth daily., Disp: 30 tablet, Rfl: 5   Nystatin (GERHARDT'S BUTT CREAM) CREA, Apply topically 3 times a day as needed for itching or irritation, Disp: 4 each, Rfl: 6   nystatin (MYCOSTATIN) 100000 UNIT/ML suspension, Use as directed 15 mLs  in the mouth or throat at bedtime as needed (mouth sores)., Disp: , Rfl:    nystatin-triamcinolone ointment (MYCOLOG), Apply 1 Application topically 2 (two) times daily as needed (vaginal irritation)., Disp: , Rfl:    potassium chloride SA (KLOR-CON M) 20 MEQ tablet, Take 20 mEq by mouth daily., Disp: , Rfl:    predniSONE (DELTASONE) 1 MG tablet, Take 2 tablets (2 mg total) by mouth daily. Total of 7 mg daily., Disp: 180 tablet, Rfl: 0   predniSONE (DELTASONE) 5 MG tablet, Take 1 tablet (5 mg total) by mouth daily with breakfast. Along with 2 mg tablets for total daily dose  7 mg total daily, Disp: 90 tablet, Rfl: 0   Prenatal Vit-Fe Fumarate-FA (PRENATAL VITAMINS PO), Take 1 tablet by mouth daily., Disp: , Rfl:    Probiotic Product (ALIGN PO), Take 1 capsule by mouth daily., Disp: , Rfl:    Propylene Glycol (SYSTANE COMPLETE) 0.6 % SOLN, Place 1 drop into both eyes 2 (two) times daily., Disp: 15 mL, Rfl: 5   Tiotropium Bromide-Olodaterol (STIOLTO RESPIMAT) 2.5-2.5 MCG/ACT AERS, Inhale 2 puffs into the lungs daily. (Patient taking differently: Inhale 2 puffs into the lungs every evening.), Disp: 4 g, Rfl: 11   traMADol (ULTRAM) 50 MG tablet, Take 1 tablet (50 mg total) by mouth at bedtime., Disp: 30 tablet, Rfl: 2   valsartan (DIOVAN) 80 MG tablet, Take 1 tablet (80 mg total) by mouth daily., Disp: 90 tablet, Rfl: 3   XARELTO 20 MG TABS tablet, TAKE 1 TABLET BY MOUTH DAILY WITH SUPPER, Disp: 90 tablet, Rfl: 3 No current facility-administered medications for this visit.  Facility-Administered Medications Ordered in Other Visits:    iohexol (OMNIPAQUE) 350 MG/ML injection, , , PRN, Leonie Man, MD, 105 mL at 01/11/21 0955   Medications ordered in this encounter:  Meds ordered this encounter  Medications   budesonide (PULMICORT) 1 MG/2ML nebulizer solution    Sig: Take 2 mLs (1 mg total) by nebulization daily.    Dispense:  60 mL    Refill:  0    Order Specific Question:   Supervising  Provider    Answer:   Chase Picket [[id number]]   predniSONE (DELTASONE) 10 MG tablet    Sig: Take 6 tablets PO on day 1 and day 2, take 5 tablets PO on day 3 and day 4, take 4 tablets PO on day 5 and day 6, take 3 tablets PO on day 7 and day 8, take 2 tablets PO on day 9 and day 10, take one tablet PO on day 11 and day 12.    Dispense:  42 tablet    Refill:  0    Order Specific Question:   Supervising Provider    Answer:   Chase Picket [[id number]]   azithromycin (ZITHROMAX) 250 MG tablet    Sig: Take 2 tablets on day 1, then 1 tablet daily on days 2 through 5    Dispense:  6 tablet    Refill:  0    Order Specific Question:   Supervising Provider    Answer:   Chase Picket [[id number]]     *If you need refills on other medications prior to your next appointment, please contact your pharmacy*  Follow-Up: Call back or seek an in-person evaluation if the symptoms worsen or if the condition fails to improve as anticipated.  [place] [phone]  Other Instructions  Acute Bronchitis, Adult  Acute bronchitis is sudden inflammation of the main airways (bronchi) that come off the windpipe (trachea) in the lungs. The swelling causes the airways to get smaller and make more mucus than normal. This can make it hard to breathe and can cause coughing or noisy breathing (wheezing). Acute bronchitis may last several weeks. The cough may last longer. Allergies, asthma, and exposure to smoke may make the condition worse. What are the causes? This condition can be caused by germs and by substances that irritate the lungs, including: Cold and flu viruses. The most common cause of this condition is the virus that causes the common cold. Bacteria. This is less common. Breathing in  substances that irritate the lungs, including: Smoke from cigarettes and other forms of tobacco. Dust and pollen. Fumes from household cleaning products, gases, or burned fuel. Indoor or outdoor air  pollution. What increases the risk? The following factors may make you more likely to develop this condition: A weak body's defense system, also called the immune system. A condition that affects your lungs and breathing, such as asthma. What are the signs or symptoms? Common symptoms of this condition include: Coughing. This may bring up clear, yellow, or green mucus from your lungs (sputum). Wheezing. Runny or stuffy nose. Having too much mucus in your lungs (chest congestion). Shortness of breath. Aches and pains, including sore throat or chest. How is this diagnosed? This condition is usually diagnosed based on: Your symptoms and medical history. A physical exam. You may also have other tests, including tests to rule out other conditions, such as pneumonia. These tests include: A test of lung function. Test of a mucus sample to look for the presence of bacteria. Tests to check the oxygen level in your blood. Blood tests. Chest X-ray. How is this treated? Most cases of acute bronchitis clear up over time without treatment. Your health care provider may recommend: Drinking more fluids to help thin your mucus so it is easier to cough up. Taking inhaled medicine (inhaler) to improve air flow in and out of your lungs. Using a vaporizer or a humidifier. These are machines that add water to the air to help you breathe better. Taking a medicine that thins mucus and clears congestion (expectorant). Taking a medicine that prevents or stops coughing (cough suppressant). It is not common to take an antibiotic medicine for this condition. Follow these instructions at home:  Take over-the-counter and prescription medicines only as told by your health care provider. Use an inhaler, vaporizer, or humidifier as told by your health care provider. Take two teaspoons (10 mL) of honey at bedtime to lessen coughing at night. Drink enough fluid to keep your urine pale yellow. Do not use any  products that contain nicotine or tobacco. These products include cigarettes, chewing tobacco, and vaping devices, such as e-cigarettes. If you need help quitting, ask your health care provider. Get plenty of rest. Return to your normal activities as told by your health care provider. Ask your health care provider what activities are safe for you. Keep all follow-up visits. This is important. How is this prevented? To lower your risk of getting this condition again: Wash your hands often with soap and water for at least 20 seconds. If soap and water are not available, use hand sanitizer. Avoid contact with people who have cold symptoms. Try not to touch your mouth, nose, or eyes with your hands. Avoid breathing in smoke or chemical fumes. Breathing smoke or chemical fumes will make your condition worse. Get the flu shot every year. Contact a health care provider if: Your symptoms do not improve after 2 weeks. You have trouble coughing up the mucus. Your cough keeps you awake at night. You have a fever. Get help right away if you: Cough up blood. Feel pain in your chest. Have severe shortness of breath. Faint or keep feeling like you are going to faint. Have a severe headache. Have a fever or chills that get worse. These symptoms may represent a serious problem that is an emergency. Do not wait to see if the symptoms will go away. Get medical help right away. Call your local emergency services (911 in the [place]  Do not drive yourself to the hospital. Summary Acute bronchitis is inflammation of the main airways (bronchi) that come off the windpipe (trachea) in the lungs. The swelling causes the airways to get smaller and make more mucus than normal. Drinking more fluids can help thin your mucus so it is easier to cough up. Take over-the-counter and prescription medicines only as told by your health care provider. Do not use any products that contain nicotine or tobacco. These products  include cigarettes, chewing tobacco, and vaping devices, such as e-cigarettes. If you need help quitting, ask your health care provider. Contact a health care provider if your symptoms do not improve after 2 weeks. This information is not intended to replace advice given to you by your health care provider. Make sure you discuss any questions you have with your health care provider. Document Revised: 05/18/2021 Document Reviewed: 06/08/2020 Elsevier Patient Education  [place].    If you have been instructed to have an in-person evaluation today at a local Urgent Care facility, please use the link below. It will take you to a list of all of our available [facility], including address, phone number and hours of operation. Please do not delay care.  [facility]  If you or a family member do not have a primary care provider, use the link below to schedule a visit and establish care. When you choose a [facility] primary care physician or advanced practice provider, you gain a long-term partner in health. Find a Primary Care Provider  Learn more about [facility] in-office and virtual care options: [facility]

## 2021-11-24 NOTE — Telephone Encounter (Signed)
Called and spoke with patient. She verbalized understanding.   Nothing further needed at time of call.  

## 2021-11-24 NOTE — Telephone Encounter (Signed)
Pt called the office as the infusion nurse was currently at her place seeing her. The infusion nurse stated that pt has rhonchi in all lung fields and breathing has not improved at all even with using nebulizer, vibrating vest, and the peak flow meter.  Pt is taking her '7mg'$  prednisone every day. States that she has used the rescue inhaler at least twice a day.  Other than pt having complaints of SOB, she is also coughing getting up yellow phlegm and also is wheezing.  Pt wants to know if there is anything that might be able to be recommended to help with her symptoms. Dr. Melvyn Novas, please advise.

## 2021-11-27 DIAGNOSIS — J398 Other specified diseases of upper respiratory tract: Secondary | ICD-10-CM | POA: Diagnosis not present

## 2021-11-27 DIAGNOSIS — J479 Bronchiectasis, uncomplicated: Secondary | ICD-10-CM | POA: Diagnosis not present

## 2021-11-27 DIAGNOSIS — J45901 Unspecified asthma with (acute) exacerbation: Secondary | ICD-10-CM | POA: Diagnosis not present

## 2021-11-27 DIAGNOSIS — R059 Cough, unspecified: Secondary | ICD-10-CM | POA: Diagnosis not present

## 2021-11-29 ENCOUNTER — Ambulatory Visit: Payer: Medicare PPO | Attending: Internal Medicine | Admitting: Internal Medicine

## 2021-11-29 ENCOUNTER — Encounter: Payer: Self-pay | Admitting: Internal Medicine

## 2021-11-29 VITALS — BP 129/68 | HR 69 | Resp 15 | Ht 61.0 in | Wt 141.6 lb

## 2021-11-29 DIAGNOSIS — M25462 Effusion, left knee: Secondary | ICD-10-CM | POA: Diagnosis not present

## 2021-11-29 DIAGNOSIS — M0600 Rheumatoid arthritis without rheumatoid factor, unspecified site: Secondary | ICD-10-CM

## 2021-11-29 DIAGNOSIS — M797 Fibromyalgia: Secondary | ICD-10-CM

## 2021-11-29 DIAGNOSIS — M1120 Other chondrocalcinosis, unspecified site: Secondary | ICD-10-CM

## 2021-11-29 MED ORDER — GABAPENTIN 300 MG PO CAPS: 300.0000 mg | ORAL_CAPSULE | Freq: Three times a day (TID) | ORAL | 0 refills | Status: DC | PRN

## 2021-12-03 ENCOUNTER — Encounter (HOSPITAL_BASED_OUTPATIENT_CLINIC_OR_DEPARTMENT_OTHER): Payer: Self-pay | Admitting: Cardiology

## 2021-12-21 ENCOUNTER — Other Ambulatory Visit: Payer: Self-pay

## 2021-12-21 ENCOUNTER — Ambulatory Visit: Payer: Medicare PPO | Admitting: Hematology and Oncology

## 2021-12-21 ENCOUNTER — Other Ambulatory Visit: Payer: Self-pay | Admitting: *Deleted

## 2021-12-21 ENCOUNTER — Inpatient Hospital Stay: Payer: Medicare PPO | Attending: Hematology and Oncology

## 2021-12-21 ENCOUNTER — Other Ambulatory Visit: Payer: Medicare PPO

## 2021-12-21 ENCOUNTER — Inpatient Hospital Stay: Payer: Medicare PPO | Admitting: Hematology and Oncology

## 2021-12-21 VITALS — BP 134/52 | HR 83 | Temp 98.0°F | Resp 20 | Wt 141.1 lb

## 2021-12-21 DIAGNOSIS — D509 Iron deficiency anemia, unspecified: Secondary | ICD-10-CM

## 2021-12-21 DIAGNOSIS — D801 Nonfamilial hypogammaglobulinemia: Secondary | ICD-10-CM | POA: Insufficient documentation

## 2021-12-21 DIAGNOSIS — R5383 Other fatigue: Secondary | ICD-10-CM | POA: Insufficient documentation

## 2021-12-21 DIAGNOSIS — K922 Gastrointestinal hemorrhage, unspecified: Secondary | ICD-10-CM | POA: Diagnosis not present

## 2021-12-21 DIAGNOSIS — Z801 Family history of malignant neoplasm of trachea, bronchus and lung: Secondary | ICD-10-CM | POA: Diagnosis not present

## 2021-12-21 DIAGNOSIS — M0600 Rheumatoid arthritis without rheumatoid factor, unspecified site: Secondary | ICD-10-CM | POA: Insufficient documentation

## 2021-12-21 DIAGNOSIS — Z86711 Personal history of pulmonary embolism: Secondary | ICD-10-CM | POA: Diagnosis not present

## 2021-12-21 DIAGNOSIS — Z86718 Personal history of other venous thrombosis and embolism: Secondary | ICD-10-CM | POA: Diagnosis not present

## 2021-12-21 DIAGNOSIS — Z7901 Long term (current) use of anticoagulants: Secondary | ICD-10-CM | POA: Diagnosis not present

## 2021-12-21 DIAGNOSIS — D539 Nutritional anemia, unspecified: Secondary | ICD-10-CM | POA: Diagnosis not present

## 2021-12-21 DIAGNOSIS — Z800 Family history of malignant neoplasm of digestive organs: Secondary | ICD-10-CM | POA: Insufficient documentation

## 2021-12-21 DIAGNOSIS — D500 Iron deficiency anemia secondary to blood loss (chronic): Secondary | ICD-10-CM | POA: Diagnosis not present

## 2021-12-21 DIAGNOSIS — Z853 Personal history of malignant neoplasm of breast: Secondary | ICD-10-CM | POA: Insufficient documentation

## 2021-12-21 LAB — CBC WITH DIFFERENTIAL (CANCER CENTER ONLY)
Abs Immature Granulocytes: 0.09 10*3/uL — ABNORMAL HIGH (ref 0.00–0.07)
Basophils Absolute: 0.1 10*3/uL (ref 0.0–0.1)
Basophils Relative: 0 %
Eosinophils Absolute: 0.2 10*3/uL (ref 0.0–0.5)
Eosinophils Relative: 1 %
HCT: 31.6 % — ABNORMAL LOW (ref 36.0–46.0)
Hemoglobin: 10.4 g/dL — ABNORMAL LOW (ref 12.0–15.0)
Immature Granulocytes: 1 %
Lymphocytes Relative: 5 %
Lymphs Abs: 0.7 10*3/uL (ref 0.7–4.0)
MCH: 36 pg — ABNORMAL HIGH (ref 26.0–34.0)
MCHC: 32.9 g/dL (ref 30.0–36.0)
MCV: 109.3 fL — ABNORMAL HIGH (ref 80.0–100.0)
Monocytes Absolute: 1.2 10*3/uL — ABNORMAL HIGH (ref 0.1–1.0)
Monocytes Relative: 9 %
Neutro Abs: 10.9 10*3/uL — ABNORMAL HIGH (ref 1.7–7.7)
Neutrophils Relative %: 84 %
Platelet Count: 204 10*3/uL (ref 150–400)
RBC: 2.89 MIL/uL — ABNORMAL LOW (ref 3.87–5.11)
RDW: 17.5 % — ABNORMAL HIGH (ref 11.5–15.5)
WBC Count: 13.1 10*3/uL — ABNORMAL HIGH (ref 4.0–10.5)
nRBC: 0.3 % — ABNORMAL HIGH (ref 0.0–0.2)

## 2021-12-21 LAB — CMP (CANCER CENTER ONLY)
ALT: 57 U/L — ABNORMAL HIGH (ref 0–44)
AST: 49 U/L — ABNORMAL HIGH (ref 15–41)
Albumin: 3.7 g/dL (ref 3.5–5.0)
Alkaline Phosphatase: 60 U/L (ref 38–126)
Anion gap: 11 (ref 5–15)
BUN: 42 mg/dL — ABNORMAL HIGH (ref 8–23)
CO2: 26 mmol/L (ref 22–32)
Calcium: 9.4 mg/dL (ref 8.9–10.3)
Chloride: 105 mmol/L (ref 98–111)
Creatinine: 1.93 mg/dL — ABNORMAL HIGH (ref 0.44–1.00)
GFR, Estimated: 27 mL/min — ABNORMAL LOW (ref >60)
Glucose, Bld: 168 mg/dL — ABNORMAL HIGH (ref 70–99)
Potassium: 4.4 mmol/L (ref 3.5–5.1)
Sodium: 142 mmol/L (ref 135–145)
Total Bilirubin: 0.5 mg/dL (ref 0.3–1.2)
Total Protein: 7.3 g/dL (ref 6.5–8.1)

## 2021-12-21 LAB — RETIC PANEL
Immature Retic Fract: 37.3 % — ABNORMAL HIGH (ref 2.3–15.9)
RBC.: 2.76 MIL/uL — ABNORMAL LOW (ref 3.87–5.11)
Retic Count, Absolute: 72.6 10*3/uL (ref 19.0–186.0)
Retic Ct Pct: 2.6 % (ref 0.4–3.1)
Reticulocyte Hemoglobin: 36 pg (ref >27.9)

## 2021-12-21 LAB — IRON AND IRON BINDING CAPACITY (CC-WL,HP ONLY)
Iron: 195 ug/dL — ABNORMAL HIGH (ref 28–170)
Saturation Ratios: 51 % — ABNORMAL HIGH (ref 10.4–31.8)
TIBC: 379 ug/dL (ref 250–450)
UIBC: 184 ug/dL (ref 148–442)

## 2021-12-21 LAB — FERRITIN: Ferritin: 33 ng/mL (ref 11–307)

## 2021-12-21 MED ORDER — POTASSIUM CHLORIDE CRYS ER 10 MEQ PO TBCR: 10.0000 meq | EXTENDED_RELEASE_TABLET | Freq: Every day | ORAL | 1 refills | Status: DC

## 2021-12-21 MED ORDER — VALSARTAN 40 MG PO TABS: 40.0000 mg | ORAL_TABLET | Freq: Every day | ORAL | 3 refills | Status: DC

## 2021-12-21 NOTE — Progress Notes (Signed)
[place] [phone]   [phone]  PROGRESS NOTE  Patient Care Team: [name], MD as PCP - General (Family Medicine) Buford Dresser, MD as PCP - Cardiology (Cardiology) Buford Dresser, MD as Consulting Physician (Cardiology)  Hematological/Oncological History #Iron deficiency anemia: 1) 02/04/2021-02/07/2021: Admitted for iron deficiency anemia after presenting with Hgb of 6.5.  Patient underwent EGD 12/18 which revealed gastric antral vascular ectasia, treated with APC. Aspirin/plavix/xarelto held during hospitalization. Patient was given 1 unit of PRBC and IV venofer 200 mg x 1. Discussed with cardiology with recommendations to stop aspirin but patient should continue on Plavix. Xarelto was also resumed due to history of recurrent DVT/PE.   2) 02/24/2021: Hgb 8.3, MCV 98.2, Plt 404K, Iron 14, Iron Saturation 3.4%, Ferritin 74.6.  3) 03/14/2021: Establish care with [facility] Hematology/Oncology  4) 03/15/2021-04/12/2021: Received IV venofer 200 mg once weekly x 5 doses.   5) 09/19/2021: Labs show white blood cell count 11.0, hemoglobin 9.1, MCV 114.3, and platelets of 219   HISTORY OF PRESENTING ILLNESS:  Ebony Scott 71 y.o. female returns for a follow up for iron deficiency anemia. She is accompanied by her husband for this visit.  Her last visit was on 03/14/2021.  Since that time she is had no major changes in her health.  At today's visit, Ebony Scott he continues to struggle with fatigue and exhaustion.  She Scott that she will typically eat breakfast and then shortly thereafter have to sit on the couch and take a nap.  She Scott that she is using a vibrating vest and flutter valve in order to help break up the congestion in her chest.  She is also taking Mucinex.  She Scott that she does continue to struggle with right foot pain.  She is not having any trouble with bleeding, bruising, or dark stools.  She notes that  she is eating well though her cough can be limiting at times.  She denies fevers, chills, sweats, chest pain or cough. She has no other complaints. Rest of the 10 point ROS is below.    MEDICAL HISTORY:  Past Medical History:  Diagnosis Date   Abscess of dorsum of right hand 09/22/2020   Acute deep vein thrombosis (DVT) of right lower extremity ([place]) 12/13/2017   Allergic rhinitis    Anemia    Angio-edema    Anxiety    pt denies dx   Arthritis    Phreesia 10/21/2019   Asthma    Phreesia 10/21/2019   Breast cancer ([place]) 1998   Left breast, in remission   Cataract    REMOVED   CHF (congestive heart failure) ([place])    Clostridium difficile colitis 65/53/7482   Complication of anesthesia    "had hard time waking up from it several times" (02/20/2012), no anesthesia problems since 2014   Coronary artery disease    Depression    "some; don't take anything for it" (02/20/2012), pt denies dx as of 06/02/21   Diverticulosis    DVT (deep venous thrombosis) ([place])    Dyspnea    all the time   Fibromyalgia 11/2011   GERD (gastroesophageal reflux disease)    Graves disease    Headache(784.0)    "related to allergies; more at different times during the year" (02/20/2012)   Hemorrhoids    Hiatal hernia    back and neck   History of blood transfusion 2023   1 unit, 5 units of Iron   Hx of adenomatous  colonic polyps 04/12/2016   Hypercholesteremia    good cholesterol is high   Hypothyroidism    IBS (irritable bowel syndrome)    Moderate persistent asthma    -FeV1 72% 2011, -IgE 102 2011, CT sinus Neg 2011   Osteomyelitis of second toe of right foot ([place]) 09/23/2020   Osteoporosis    on reclast yearly   Peripheral vascular disease ([place]) 2019   DVTs (3) lungs and leg   Pneumonia 04/2011; ~ 11/2011   "double; single" (02/20/2012)   Septic olecranon bursitis of right elbow 09/22/2020   Seronegative rheumatoid arthritis ([place])    Dr. Lahoma Rocker   SIRS (systemic inflammatory response  syndrome) ([place]) 02/10/2018   Sleep apnea    uses cpap nightly   Tracheobronchomalacia     SURGICAL HISTORY: Past Surgical History:  Procedure Laterality Date   ABDOMINAL HYSTERECTOMY N/A    Phreesia 10/21/2019   ANTERIOR AND POSTERIOR REPAIR  1990's   APPENDECTOMY     ARGON LASER APPLICATION  82/07/154   Procedure: ARGON LASER APPLICATION;  Surgeon: Yetta Flock, MD;  [facility]: [facility];  Service: Gastroenterology;;   BREAST LUMPECTOMY  1998   left   BREAST SURGERY N/A    Phreesia 10/21/2019   BRONCHIAL WASHINGS  04/05/2020   Procedure: BRONCHIAL WASHINGS;  Surgeon: Freddi Starr, MD;  [facility]: [facility];  Service: Pulmonary;;   CAPSULOTOMY Right 08/28/2021   Procedure: CAPSULOTOMY;  Surgeon: Felipa Furnace, DPM;  Location: [place];  Service: Podiatry;  Laterality: Right;   CARPOMETACARPEL ([place]) FUSION OF THUMB WITH AUTOGRAFT FROM RADIUS  ~ 2009   "both thumbs" (02/20/2012)   CATARACT EXTRACTION W/ INTRAOCULAR LENS  IMPLANT, BILATERAL  2012   CERVICAL DISCECTOMY  10/2001   C5-C6   CERVICAL FUSION  2003   C3-C4   CHOLECYSTECTOMY     COLONOSCOPY     CORONARY STENT INTERVENTION N/A 01/11/2021   Procedure: CORONARY STENT INTERVENTION;  Surgeon: Leonie Man, MD;  Location: [place] [facility];  Service: Cardiovascular;  Laterality: N/A;   DEBRIDEMENT TENNIS ELBOW  ?1970's   right   ESOPHAGOGASTRODUODENOSCOPY     ESOPHAGOGASTRODUODENOSCOPY (EGD) WITH PROPOFOL N/A 02/05/2021   Procedure: ESOPHAGOGASTRODUODENOSCOPY (EGD) WITH PROPOFOL;  Surgeon: Yetta Flock, MD;  [facility];  Service: Gastroenterology;  Laterality: N/A;   EYE SURGERY N/A    Phreesia 10/21/2019   HAMMER TOE SURGERY Right 08/28/2021   Procedure: HAMMER TOE REPAIR 2ND TOE RIGHT FOOT;  Surgeon: Felipa Furnace, DPM;  Location: [place];  Service: Podiatry;  Laterality: Right;   HYSTERECTOMY     I & D EXTREMITY Right 09/22/2020   Procedure: IRRIGATION AND DEBRIDEMENT RIGHT HAND AND  ELBOW;  Surgeon: Marchia Bond, MD;  [facility];  Service: Orthopedics;  Laterality: Right;   KNEE ARTHROPLASTY  ?1990's   "?right; w/cartilage repair" (02/20/2012)   MASS EXCISION Left 08/28/2021   Procedure: EXCISION OF SOFT TISSUE  MASS LEFT FOOT;  Surgeon: Felipa Furnace, DPM;  Location: [place];  Service: Podiatry;  Laterality: Left;   NASAL SEPTUM SURGERY  1980's   POSTERIOR CERVICAL FUSION/FORAMINOTOMY  2004   "failed initial fusion; rewired  anterior neck" (02/20/2012)   REVERSE SHOULDER ARTHROPLASTY Right 02/16/2020   Procedure: REVERSE SHOULDER ARTHROPLASTY;  Surgeon: Marchia Bond, MD;  [facility];  Service: Orthopedics;  Laterality: Right;   RIGHT/LEFT HEART CATH AND CORONARY ANGIOGRAPHY N/A 01/11/2021   Procedure: RIGHT/LEFT HEART CATH AND CORONARY ANGIOGRAPHY;  Surgeon: Leonie Man,  MD;  Location: [place] [facility];  Service: Cardiovascular;  Laterality: N/A;   SPINE SURGERY N/A    Phreesia 10/21/2019   TONSILLECTOMY  ~ 1953   VESICOVAGINAL FISTULA CLOSURE W/ TAH  1988   VIDEO BRONCHOSCOPY Bilateral 08/23/2016   Procedure: VIDEO BRONCHOSCOPY WITH FLUORO;  Surgeon: Javier Glazier, MD;  [facility];  Service: Cardiopulmonary;  Laterality: Bilateral;   VIDEO BRONCHOSCOPY N/A 04/05/2020   Procedure: VIDEO BRONCHOSCOPY WITHOUT FLUORO;  Surgeon: Freddi Starr, MD;  [facility];  Service: Pulmonary;  Laterality: N/A;    SOCIAL HISTORY: Social History   Socioeconomic History   Marital status: Divorced    Spouse name: Not on file   Number of children: 2   Years of education: college   Highest education level: Not on file  Occupational History   Occupation: Disabled    Comment: Retired Engineer, production: RETIRED  Tobacco Use   Smoking status: Never    Passive exposure: Yes   Smokeless tobacco: Never   Tobacco comments:    Parents  Vaping Use   Vaping Use: Never used  Substance and Sexual Activity    Alcohol use: Not Currently    Alcohol/week: 0.0 standard drinks of alcohol   Drug use: No   Sexual activity: Not Currently    Birth control/protection: Surgical    Comment: Hysterectomy  Other Topics Concern   Not on file  Social History Narrative   Patient lives at home alone. Patient  divorced.    Patient has her BS degree.   Right handed.   Caffeine- sometimes coffee.      [facility]:   Born in [place], [place]. She worked as a Copywriter, advertising. She has no pets currently. She does have indoor plants. Previously had mold in her home that was remediated. Carpet was removed.          Social Determinants of Health   Financial Resource Strain: Low Risk  (07/07/2020)   Overall Financial Resource Strain (CARDIA)    Difficulty of Paying Living Expenses: Not hard at all  Food Insecurity: No Food Insecurity (07/13/2021)   Hunger Vital Sign    Worried About Running Out of Food in the Last Year: Never true    Ran Out of Food in the Last Year: Never true  Transportation Needs: No Transportation Needs (07/13/2021)   PRAPARE - Hydrologist (Medical): No    Lack of Transportation (Non-Medical): No  Physical Activity: Insufficiently Active (07/13/2021)   Exercise Vital Sign    Days of Exercise per Week: 5 days    Minutes of Exercise per Session: 20 min  Stress: No Stress Concern Present (07/13/2021)   [place]    Feeling of Stress : Only a little  Social Connections: Moderately Isolated (07/13/2021)   Social Connection and Isolation Panel [NHANES]    Frequency of Communication with Friends and Family: More than three times a week    Frequency of Social Gatherings with Friends and Family: More than three times a week    Attends Religious Services: 1 to 4 times per year    Active Member of [employer] or Organizations: No    Attends Archivist Meetings: Never    Marital Status: Divorced   Human resources officer Violence: Not At Risk (07/13/2021)   Humiliation, Afraid, Rape, and Kick questionnaire    Fear of Current or Ex-Partner: No  Emotionally Abused: No    Physically Abused: No    Sexually Abused: No    FAMILY HISTORY: Family History  Problem Relation Age of Onset   Allergies Mother    Heart disease Mother    Arthritis Mother    Lung cancer Mother        heavy smoker   Diabetes Mother    Allergies Father    Heart disease Father    Arthritis Father    Stroke Father    Heart disease Brother    Diabetes Maternal Grandfather    Colitis Daughter    Colon cancer Other        Maternal half uncle    ALLERGIES:  is allergic to baclofen, dust mite extract, molds & smuts, morphine and related, other, penicillins, rofecoxib, shellfish allergy, tetracycline hcl, xolair [omalizumab], zoledronic acid, dilaudid [hydromorphone hcl], levofloxacin, oxycodone hcl, paroxetine, celecoxib, diltiazem hcl, hydrocodone-acetaminophen, lactose intolerance (gi), rituximab, tree extract, gamunex [immune globulin], and penicillin g procaine.  MEDICATIONS:  Current Outpatient Medications  Medication Sig Dispense Refill   potassium chloride (KLOR-CON M) 10 MEQ tablet Take 1 tablet (10 mEq total) by mouth daily. 90 tablet 1   valsartan (DIOVAN) 40 MG tablet Take 1 tablet (40 mg total) by mouth daily. 90 tablet 3   acetaminophen (TYLENOL) 650 MG CR tablet Take 1,300 mg by mouth every 8 (eight) hours as needed for pain.     albuterol (VENTOLIN HFA) 108 (90 Base) MCG/ACT inhaler Inhale 2 puffs into the lungs every 6 (six) hours as needed for wheezing or shortness of breath. 19 g 1   allopurinol (ZYLOPRIM) 100 MG tablet Take 1 tablet (100 mg total) by mouth daily. 90 tablet 3   Alpha-D-Galactosidase (BEANO PO) Take 2 tablets by mouth 3 (three) times daily with meals.     Alpha-Lipoic Acid 600 MG TABS Take 600 mg by mouth daily.     atorvastatin (LIPITOR) 80 MG tablet Take 1 tablet (80 mg total) by  mouth daily. 90 tablet 3   azelastine (ASTELIN) 0.1 % nasal spray Place 2 sprays into both nostrils daily. Use in each nostril as directed (Patient taking differently: Place 2 sprays into both nostrils at bedtime as needed for rhinitis. Use in each nostril as directed) 30 mL 5   benzonatate (TESSALON) 200 MG capsule TAKE 1 CAPSULE BY MOUTH THREE TIMES DAILY AS NEEDED FOR COUGH (Patient not taking: Reported on 11/29/2021) 30 capsule 0   budesonide (PULMICORT) 1 MG/2ML nebulizer solution Take 2 mLs (1 mg total) by nebulization daily. 60 mL 0   Calcium-Magnesium-Vitamin D (CALCIUM MAGNESIUM PO) Take 1 tablet by mouth daily.     cetirizine (ZYRTEC) 10 MG tablet Take 1 tablet (10 mg total) by mouth 2 (two) times daily as needed for allergies (Can take a n extra dose during flare ups.). (Patient taking differently: Take 10 mg by mouth every evening.) 60 tablet 5   cholecalciferol (VITAMIN D) 25 MCG (1000 UNIT) tablet Take 1,000 Units by mouth every evening.     clopidogrel (PLAVIX) 75 MG tablet Take 1 tablet (75 mg total) by mouth daily. 90 tablet 3   cyproheptadine (PERIACTIN) 4 MG tablet Take 2 tablets (8 mg total) by mouth at bedtime. Take 2 tablets at bedtime as needed to treat facial pain or sleep dysfunction. (Patient taking differently: Take 8 mg by mouth at bedtime.) 60 tablet 5   denosumab (PROLIA) 60 MG/ML SOSY injection Inject 60 mg into the skin every 6 (six) months.  dexlansoprazole (DEXILANT) 60 MG capsule Take 1 capsule (60 mg total) by mouth daily. 90 capsule 3   diclofenac Sodium (VOLTAREN) 1 % GEL Apply 1 Application topically 4 (four) times daily as needed (pain). 100 g 3   empagliflozin (JARDIANCE) 10 MG TABS tablet Take 1 tablet (10 mg total) by mouth daily before breakfast. 30 tablet 5   EPINEPHrine 0.3 mg/0.3 mL IJ SOAJ injection USE AS DIRECTED BY YOUR PHYSICIAN INTRAMUSCULARLY AS NEEDED FOR ANAPHYLAXIS 2 each 1   famotidine (PEPCID) 20 MG tablet Take 1 tablet (20 mg total) by  mouth at bedtime. 30 tablet 5   fluconazole (DIFLUCAN) 100 MG tablet Take 1 tablet (100 mg total) by mouth daily. (Patient not taking: Reported on 12/21/2021) 3 tablet 0   fluticasone (FLONASE) 50 MCG/ACT nasal spray USE 2 SPRAYS IN EACH NOSTRIL DAILY (Patient taking differently: as needed.) 16 g 2   folic acid (FOLVITE) 1 MG tablet Take 1 tablet (1 mg total) by mouth daily. 90 tablet 3   furosemide (LASIX) 40 MG tablet Take 1 tablet (40 mg total) by mouth daily. 90 tablet 2   gabapentin (NEURONTIN) 100 MG capsule Take 1 capsule (100 mg total) by mouth 3 (three) times daily. 90 capsule 3   gabapentin (NEURONTIN) 300 MG capsule Take 1 capsule (300 mg total) by mouth 3 (three) times daily as needed. 90 capsule 0   guaiFENesin (MUCINEX) 600 MG 12 hr tablet Take 1 tablet (600 mg total) by mouth 2 (two) times daily as needed for cough or to loosen phlegm. (Patient taking differently: Take 600 mg by mouth 2 (two) times daily.) 60 tablet 1   HIZENTRA 1 GM/5ML SOLN Hizentra 3 times/5 ml = 65 ml total once weekly on Fridays     ipratropium-albuterol (DUONEB) 0.5-2.5 (3) MG/3ML SOLN Take 3 mLs by nebulization every 6 (six) hours as needed. 360 mL 6   isosorbide-hydrALAZINE (BIDIL) 20-37.5 MG tablet TAKE 1 TABLET BY MOUTH TWICE DAILY 60 tablet 1   Lactase (LACTOSE FAST ACTING RELIEF PO) Take 3 tablets by mouth 3 (three) times daily.     lidocaine-prilocaine (EMLA) cream Apply 1 application  topically as needed (pain).     magnesium oxide (MAG-OX) 400 (240 Mg) MG tablet Take 400 mg by mouth daily.     Menthol, Topical Analgesic, (BIOFREEZE) 4 % GEL Apply 1 application. topically daily as needed (pain).     mirtazapine (REMERON SOL-TAB) 30 MG disintegrating tablet Take 1 tablet (30 mg total) by mouth at bedtime as needed (sleep). 90 tablet 3   montelukast (SINGULAIR) 10 MG tablet Take 1 tablet (10 mg total) by mouth daily. 30 tablet 5   Nystatin (GERHARDT'S BUTT CREAM) CREA Apply topically 3 times a day as needed  for itching or irritation 4 each 6   nystatin (MYCOSTATIN) 100000 UNIT/ML suspension Use as directed 15 mLs in the mouth or throat at bedtime as needed (mouth sores).     nystatin-triamcinolone ointment (MYCOLOG) Apply 1 Application topically 2 (two) times daily as needed (vaginal irritation).     predniSONE (DELTASONE) 1 MG tablet Take 2 tablets (2 mg total) by mouth daily. Total of 7 mg daily. 180 tablet 0   predniSONE (DELTASONE) 5 MG tablet Take 1 tablet (5 mg total) by mouth daily with breakfast. Along with 2 mg tablets for total daily dose 7 mg total daily 90 tablet 0   Prenatal Vit-Fe Fumarate-FA (PRENATAL VITAMINS PO) Take 1 tablet by mouth daily.     Probiotic  Product (ALIGN PO) Take 1 capsule by mouth daily.     Propylene Glycol (SYSTANE COMPLETE) 0.6 % SOLN Place 1 drop into both eyes 2 (two) times daily. 15 mL 5   Tiotropium Bromide-Olodaterol (STIOLTO RESPIMAT) 2.5-2.5 MCG/ACT AERS Inhale 2 puffs into the lungs daily. (Patient taking differently: Inhale 2 puffs into the lungs every evening.) 4 g 11   traMADol (ULTRAM) 50 MG tablet Take 1 tablet (50 mg total) by mouth at bedtime. 30 tablet 2   XARELTO 20 MG TABS tablet TAKE 1 TABLET BY MOUTH DAILY WITH SUPPER 90 tablet 3   No current facility-administered medications for this visit.   Facility-Administered Medications Ordered in Other Visits  Medication Dose Route Frequency Provider Last Rate Last Admin   iohexol (OMNIPAQUE) 350 MG/ML injection    PRN Leonie Man, MD   105 mL at 01/11/21 0955    REVIEW OF SYSTEMS:   Constitutional: ( - ) fevers, ( - )  chills , ( - ) night sweats Eyes: ( - ) blurriness of vision, ( - ) double vision, ( - ) watery eyes Ears, nose, mouth, throat, and face: ( - ) mucositis, ( - ) sore throat Respiratory: ( - ) cough, ( + ) dyspnea, ( - ) wheezes Cardiovascular: ( - ) palpitation, ( - ) chest discomfort, ( - ) lower extremity swelling Gastrointestinal:  ( - ) nausea, ( - ) heartburn, ( - )  change in bowel habits Skin: ( - ) abnormal skin rashes Lymphatics: ( - ) new lymphadenopathy, ( - ) easy bruising Neurological: ( + ) numbness, ( - ) tingling, ( - ) new weaknesses Behavioral/Psych: ( - ) mood change, ( - ) new changes  All other systems were reviewed with the patient and are negative.  PHYSICAL EXAMINATION: ECOG PERFORMANCE STATUS: 2 - Symptomatic, <50% confined to bed  Vitals:   12/21/21 1121  BP: (!) 134/52  Pulse: 83  Resp: 20  Temp: 98 F (36.7 C)  SpO2: 96%    Filed Weights   12/21/21 1121  Weight: 141 lb 1.6 oz (64 kg)     GENERAL: well appearing female in NAD.  Has moon facies classic for steroid use. SKIN: skin color, texture, turgor are normal, no rashes or significant lesions EYES: conjunctiva are pink and non-injected, sclera clear LUNGS: clear to auscultation and percussion with normal breathing effort HEART: regular rate & rhythm and no murmurs and no lower extremity edema Musculoskeletal: no cyanosis of digits and no clubbing  PSYCH: alert & oriented x 3, fluent speech NEURO: no focal motor/sensory deficits  LABORATORY DATA:  I have reviewed the data as listed    Latest Ref Rng & Units 12/21/2021   10:31 AM 10/19/2021    7:39 AM 09/19/2021   10:18 AM  CBC  WBC 4.0 - 10.5 K/uL 13.1  8.0  11.0   Hemoglobin 12.0 - 15.0 g/dL 10.4  10.2  9.1   Hematocrit 36.0 - 46.0 % 31.6  32.2  28.8   Platelets 150 - 400 K/uL 204  261  219        Latest Ref Rng & Units 12/21/2021   10:31 AM 09/19/2021   10:18 AM 08/14/2021   11:56 AM  CMP  Glucose 70 - 99 mg/dL 168  177  218   BUN 8 - 23 mg/dL 42  35  38   Creatinine 0.44 - 1.00 mg/dL 1.93  1.72  1.37   Sodium 135 - 145  mmol/L 142  140  141   Potassium 3.5 - 5.1 mmol/L 4.4  4.4  4.4   Chloride 98 - 111 mmol/L 105  106  105   CO2 22 - 32 mmol/L _0 Calcium 8.9 - 10.3 mg/dL 9.4  8.8  8.9   Total Protein 6.5 - 8.1 g/dL 7.3  7.2  6.3   Total Bilirubin 0.3 - 1.2 mg/dL 0.5  0.5  0.6   Alkaline  Phos 38 - 126 U/L 60  49    AST 15 - 41 U/L 49  40  59   ALT 0 - 44 U/L 57  38  73     ASSESSMENT & PLAN AYANE DELANCEY is a 71 y.o. female who returns for a follow up for iron deficiency anemia.   #Iron deficiency anemia 2/2 GI bleed: --EGD 02/05/21 revealed gastric antral vascular ectasia, treated with APC.  --Under the care of [facility], last seen on 02/24/2021. Plan to hold off on further endoscopic evaluation for at least 6 months post her stent placement in order to safely take her off her blood thinners/antiplatelet agents at that time. --Unable to tolerate iron pills due to constipation --Received IV venofer 200 mg x 5 doses from 03/15/2021-04/12/2021 Plan: --Labs from today are stable from prior.  Labs show white blood cell count 13.1, hemoglobin 10.4, MCV 109.3, and platelets of 204.  Iron studies appear to be within normal limits. --bone marrow biopsy showed some mild dysplasia with negative MDS FISH panel --if Hgb is persistently below 10 can consider EPO administration.  --No additional IV iron needed at this time.  --RTC in 3 months with labs  #Fatigue: --Checked TSH levels previously in April 2023, found to be normal. --Consider follow up with PCP to further evaluate other etiologies for fatigue since anemia has improved.   # Hx of DVT and PE: --First episode of acute DVT involving the left posterior tibial and pertoneal veins on 02/23/2012.  --Second episode acute DVT involving right peroneal vein and bilateral pulmonary emboli on 12/13/2017. Felt to be unprovoked so recommended indefinite anticoagulation.  --Currently on Xarelto 20 mg once daily.   #Seronegative rheumatoid arthritis. --Currently on Prednisone 7 mg daily.    # CVID/hypogammaglobulinemia  --Receiving IVIG tx   No orders of the defined types were placed in this encounter.   All questions were answered. The patient knows to call the clinic with any problems, questions or concerns.  I have spent a total  of 30 minutes minutes of face-to-face and non-face-to-face time, preparing to see the patient,  performing a medically appropriate examination, counseling and educating the patient, documenting clinical information in the electronic health record, and care coordination.   Ledell Peoples, MD Department of Hematology/Oncology [place] [facility] Phone: [phone] Pager: [phone] Email: [email or web address]

## 2021-12-22 ENCOUNTER — Other Ambulatory Visit: Payer: Self-pay

## 2021-12-22 ENCOUNTER — Telehealth: Payer: Self-pay | Admitting: Family Medicine

## 2021-12-22 DIAGNOSIS — I5020 Unspecified systolic (congestive) heart failure: Secondary | ICD-10-CM

## 2021-12-22 MED ORDER — EMPAGLIFLOZIN 10 MG PO TABS: 10.0000 mg | ORAL_TABLET | Freq: Every day | ORAL | 5 refills | Status: DC

## 2021-12-22 NOTE — Telephone Encounter (Signed)
Pt called in to ask if pcp would please send a rush refill of this med empagliflozin (JARDIANCE) 10 MG TABS tablet [[id number]].  Pt stated that she is completely out of this med and would not like to miss a dose. Please advise.  LOV: 05/30/21  PHARMACY:  [facility] [address] [place], [place]

## 2021-12-22 NOTE — Telephone Encounter (Signed)
Patient called to inform provider she received a COVID booster at [facility] yesterday. Patient stated they didn't provide her with any proof of receiving the vaccination; advised patient to request a copy of it and bring it here so we can update her records.

## 2021-12-28 DIAGNOSIS — J479 Bronchiectasis, uncomplicated: Secondary | ICD-10-CM | POA: Diagnosis not present

## 2021-12-28 DIAGNOSIS — R059 Cough, unspecified: Secondary | ICD-10-CM | POA: Diagnosis not present

## 2021-12-28 DIAGNOSIS — J45901 Unspecified asthma with (acute) exacerbation: Secondary | ICD-10-CM | POA: Diagnosis not present

## 2021-12-28 DIAGNOSIS — J398 Other specified diseases of upper respiratory tract: Secondary | ICD-10-CM | POA: Diagnosis not present

## 2022-01-08 ENCOUNTER — Encounter: Payer: Self-pay | Admitting: Internal Medicine

## 2022-01-08 ENCOUNTER — Ambulatory Visit: Payer: Medicare PPO | Admitting: Internal Medicine

## 2022-01-08 ENCOUNTER — Other Ambulatory Visit: Payer: Self-pay | Admitting: Internal Medicine

## 2022-01-08 VITALS — BP 104/48 | HR 62 | Ht 61.0 in | Wt 141.0 lb

## 2022-01-08 DIAGNOSIS — K31819 Angiodysplasia of stomach and duodenum without bleeding: Secondary | ICD-10-CM

## 2022-01-08 DIAGNOSIS — R143 Flatulence: Secondary | ICD-10-CM | POA: Diagnosis not present

## 2022-01-08 DIAGNOSIS — M0600 Rheumatoid arthritis without rheumatoid factor, unspecified site: Secondary | ICD-10-CM

## 2022-01-08 DIAGNOSIS — K638219 Small intestinal bacterial overgrowth, unspecified: Secondary | ICD-10-CM | POA: Diagnosis not present

## 2022-01-08 DIAGNOSIS — Z86010 Personal history of colonic polyps: Secondary | ICD-10-CM | POA: Diagnosis not present

## 2022-01-08 DIAGNOSIS — Z7901 Long term (current) use of anticoagulants: Secondary | ICD-10-CM | POA: Diagnosis not present

## 2022-01-08 DIAGNOSIS — D839 Common variable immunodeficiency, unspecified: Secondary | ICD-10-CM

## 2022-01-08 DIAGNOSIS — M797 Fibromyalgia: Secondary | ICD-10-CM

## 2022-01-08 MED ORDER — RIFAXIMIN 550 MG PO TABS: 550.0000 mg | ORAL_TABLET | Freq: Three times a day (TID) | ORAL | 0 refills | Status: DC

## 2022-01-08 NOTE — Progress Notes (Signed)
Ebony Scott 71 y.o. 1950/06/11 [id number]  Assessment & Plan:   Encounter Diagnoses  Name Primary?   Flatulence Yes   Small intestinal bacterial overgrowth (SIBO) - suspected    CVID (common variable immunodeficiency) (HCC)    Anticoagulated    Hx of adenomatous colonic polyps    GAVE (gastric antral vascular ectasia)     She has responded to antibiotic treatment for similar problems with gas thought to be SIBO in the past.  Samples of Xifaxan 550 mg 3 times daily for 2 weeks provided.   She was technically due for a surveillance colonoscopy this year we deferred that earlier this year to look at it again in early 2024.  She has significant comorbidities and it may not make sense to continue surveillance.  She understands this.  We will revisit things next year.   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  She also has a history of 3 adenomatous colonic polyps in February 2018.  GAVE was diagnosed and treated with APC in 02/07/2021 when hospitalized.   She is reporting problems with increased belching and passing of flatus consistent with prior problems where I had suspected SIBO and treated with Xifaxan.  Not much in the way of pain but there is a fair amount of abdominal distention.  She is not having diarrhea or constipation.  She does not leak urine when she coughs.  She does feel fairly distended.  She is very careful about using Lactaid and she also takes Beano chronically.  She has been taken off oral iron after it has been determined that her iron deficiency was treated though records also indicates she was constipated on that and could not tolerate it..  She had a bone  marrow evaluation as summarized below she is followed by Dr. Lorenso Courier in hematology.  He is considering the possibility of EPO administration if needed.   She is followed for her hypogammaglobulinemia and her common variable immune deficiency and allergy clinic, and she also is followed in pulmonary for bronchiectasis and uses a vibrating vest to help clear secretions and improve respiration.          Latest Ref Rng & Units 12/21/2021   10:31 AM 10/19/2021    7:39 AM 09/19/2021   10:18 AM  CBC  WBC 4.0 - 10.5 K/uL 13.1  8.0  11.0   Hemoglobin 12.0 - 15.0 g/dL 10.4  10.2  9.1   Hematocrit 36.0 - 46.0 % 31.6  32.2  28.8   Platelets 150 - 400 K/uL 204  261  219     Lab Results  Component Value Date   FERRITIN 33 12/21/2021  Bone marrow biopsy and aspirate in August 2023 did not reveal any significant clinical abnormality.  It was mildly hypercellular for age but no significant abnormalities otherwise and flow cytometry and FISH testing were negative.    Allergies  Allergen Reactions   Baclofen Other (See Comments)    Altered mental status requiring a 3-day hospital stay- patient became unresponsive   Dust Mite Extract Shortness Of Breath and Other (See Comments)    "sneezing" (02/20/2012)   Molds & Smuts Shortness Of Breath   Morphine And Related Hives and  Itching   Other Shortness Of Breath and Other (See Comments)    Grass and weeds "sneezing; filled sinuses" (02/20/2012)   Penicillins Rash and Other (See Comments)    "welts" (02/20/2012) Tolerated Ancef 02/16/20.    Rofecoxib Swelling and Other (See Comments)    Vioxx- feet swelling    Shellfish Allergy Anaphylaxis, Shortness Of Breath, Itching and Rash   Tetracycline Hcl Nausea And Vomiting   Xolair [Omalizumab] Other (See Comments)    Caused Blood clot   Zoledronic Acid Other (See Comments)    Fever, Put in hospital, dr said it was a reaction from a reaction  Other reaction(s): Unknown   Dilaudid [Hydromorphone Hcl] Itching    Levofloxacin Other (See Comments)    GI upset   Oxycodone Hcl Nausea And Vomiting   Paroxetine Nausea And Vomiting and Other (See Comments)    Paxil    Celecoxib Swelling and Other (See Comments)    Feet swelling   Diltiazem Hcl Swelling   Hydrocodone-Acetaminophen Itching   Lactose Intolerance (Gi) Other (See Comments)    Bloating and gas   Rituximab     Anemia    Tree Extract Other (See Comments)    "tested and told I was allergic to it; never experienced a reaction to it" (02/20/2012)   Gamunex [Immune Globulin] Itching and Rash    Tolerates hizentra    Penicillin G Procaine Rash and Other (See Comments)    Welts, also   Current Meds  Medication Sig   acetaminophen (TYLENOL) 650 MG CR tablet Take 1,300 mg by mouth every 8 (eight) hours as needed for pain.   albuterol (VENTOLIN HFA) 108 (90 Base) MCG/ACT inhaler Inhale 2 puffs into the lungs every 6 (six) hours as needed for wheezing or shortness of breath.   allopurinol (ZYLOPRIM) 100 MG tablet Take 1 tablet (100 mg total) by mouth daily.   Alpha-D-Galactosidase (BEANO PO) Take 2 tablets by mouth 3 (three) times daily with meals.   Alpha-Lipoic Acid 600 MG TABS Take 600 mg by mouth daily.   atorvastatin (LIPITOR) 80 MG tablet Take 1 tablet (80 mg total) by mouth daily.   azelastine (ASTELIN) 0.1 % nasal spray Place 2 sprays into both nostrils daily. Use in each nostril as directed (Patient taking differently: Place 2 sprays into both nostrils at bedtime as needed for rhinitis. Use in each nostril as directed)   benzonatate (TESSALON) 200 MG capsule TAKE 1 CAPSULE BY MOUTH THREE TIMES DAILY AS NEEDED FOR COUGH   budesonide (PULMICORT) 1 MG/2ML nebulizer solution Take 2 mLs (1 mg total) by nebulization daily.   Calcium-Magnesium-Vitamin D (CALCIUM MAGNESIUM PO) Take 1 tablet by mouth daily.   cetirizine (ZYRTEC) 10 MG tablet Take 1 tablet (10 mg total) by mouth 2 (two) times daily as needed for allergies (Can take a n extra dose  during flare ups.). (Patient taking differently: Take 10 mg by mouth every evening.)   cholecalciferol (VITAMIN D) 25 MCG (1000 UNIT) tablet Take 1,000 Units by mouth every evening.   clopidogrel (PLAVIX) 75 MG tablet Take 1 tablet (75 mg total) by mouth daily.   cyproheptadine (PERIACTIN) 4 MG tablet Take 2 tablets (8 mg total) by mouth at bedtime. Take 2 tablets at bedtime as needed to treat facial pain or sleep dysfunction. (Patient taking differently: Take 8 mg by mouth at bedtime.)   denosumab (PROLIA) 60 MG/ML SOSY injection Inject 60 mg into the skin every 6 (six) months.   dexlansoprazole (DEXILANT) 60 MG capsule  Take 1 capsule (60 mg total) by mouth daily.   diclofenac Sodium (VOLTAREN) 1 % GEL Apply 1 Application topically 4 (four) times daily as needed (pain).   empagliflozin (JARDIANCE) 10 MG TABS tablet Take 1 tablet (10 mg total) by mouth daily before breakfast.   EPINEPHrine 0.3 mg/0.3 mL IJ SOAJ injection USE AS DIRECTED BY YOUR PHYSICIAN INTRAMUSCULARLY AS NEEDED FOR ANAPHYLAXIS   famotidine (PEPCID) 20 MG tablet Take 1 tablet (20 mg total) by mouth at bedtime.   fluconazole (DIFLUCAN) 100 MG tablet Take 1 tablet (100 mg total) by mouth daily.   fluticasone (FLONASE) 50 MCG/ACT nasal spray USE 2 SPRAYS IN EACH NOSTRIL DAILY (Patient taking differently: as needed.)   folic acid (FOLVITE) 1 MG tablet Take 1 tablet (1 mg total) by mouth daily.   furosemide (LASIX) 40 MG tablet Take 1 tablet (40 mg total) by mouth daily.   gabapentin (NEURONTIN) 100 MG capsule Take 1 capsule (100 mg total) by mouth 3 (three) times daily.   guaiFENesin (MUCINEX) 600 MG 12 hr tablet Take 1 tablet (600 mg total) by mouth 2 (two) times daily as needed for cough or to loosen phlegm. (Patient taking differently: Take 600 mg by mouth 2 (two) times daily.)   HIZENTRA 1 GM/5ML SOLN Hizentra 3 times/5 ml = 65 ml total once weekly on Fridays   ipratropium-albuterol (DUONEB) 0.5-2.5 (3) MG/3ML SOLN Take 3 mLs by  nebulization every 6 (six) hours as needed.   isosorbide-hydrALAZINE (BIDIL) 20-37.5 MG tablet TAKE 1 TABLET BY MOUTH TWICE DAILY   Lactase (LACTOSE FAST ACTING RELIEF PO) Take 3 tablets by mouth 3 (three) times daily.   lidocaine-prilocaine (EMLA) cream Apply 1 application  topically as needed (pain).   magnesium oxide (MAG-OX) 400 (240 Mg) MG tablet Take 400 mg by mouth daily.   Menthol, Topical Analgesic, (BIOFREEZE) 4 % GEL Apply 1 application. topically daily as needed (pain).   mirtazapine (REMERON SOL-TAB) 30 MG disintegrating tablet Take 1 tablet (30 mg total) by mouth at bedtime as needed (sleep).   montelukast (SINGULAIR) 10 MG tablet Take 1 tablet (10 mg total) by mouth daily.   Nystatin (GERHARDT'S BUTT CREAM) CREA Apply topically 3 times a day as needed for itching or irritation   nystatin (MYCOSTATIN) 100000 UNIT/ML suspension Use as directed 15 mLs in the mouth or throat at bedtime as needed (mouth sores).   nystatin-triamcinolone ointment (MYCOLOG) Apply 1 Application topically 2 (two) times daily as needed (vaginal irritation).   potassium chloride (KLOR-CON M) 10 MEQ tablet Take 1 tablet (10 mEq total) by mouth daily.   predniSONE (DELTASONE) 1 MG tablet Take 2 tablets (2 mg total) by mouth daily. Total of 7 mg daily.   predniSONE (DELTASONE) 5 MG tablet Take 1 tablet (5 mg total) by mouth daily with breakfast. Along with 2 mg tablets for total daily dose 7 mg total daily   Prenatal Vit-Fe Fumarate-FA (PRENATAL VITAMINS PO) Take 1 tablet by mouth daily.   Probiotic Product (ALIGN PO) Take 1 capsule by mouth daily.   Propylene Glycol (SYSTANE COMPLETE) 0.6 % SOLN Place 1 drop into both eyes 2 (two) times daily.   Tiotropium Bromide-Olodaterol (STIOLTO RESPIMAT) 2.5-2.5 MCG/ACT AERS Inhale 2 puffs into the lungs daily. (Patient taking differently: Inhale 2 puffs into the lungs every evening.)   traMADol (ULTRAM) 50 MG tablet Take 1 tablet (50 mg total) by mouth at bedtime.    valsartan (DIOVAN) 40 MG tablet Take 1 tablet (40 mg total) by mouth  daily.   XARELTO 20 MG TABS tablet TAKE 1 TABLET BY MOUTH DAILY WITH SUPPER   Past Medical History:  Diagnosis Date   Abscess of dorsum of right hand 09/22/2020   Acute deep vein thrombosis (DVT) of right lower extremity ([place]) 12/13/2017   Allergic rhinitis    Anemia    Angio-edema    Anxiety    pt denies dx   Arthritis    Phreesia 10/21/2019   Asthma    Phreesia 10/21/2019   Breast cancer ([place]) 1998   Left breast, in remission   Cataract    REMOVED   CHF (congestive heart failure) ([place])    Clostridium difficile colitis 30/94/0768   Complication of anesthesia    "had hard time waking up from it several times" (02/20/2012), no anesthesia problems since 2014   Coronary artery disease    Depression    "some; don't take anything for it" (02/20/2012), pt denies dx as of 06/02/21   Diverticulosis    DVT (deep venous thrombosis) ([place])    Dyspnea    all the time   Fibromyalgia 11/2011   GAVE (gastric antral vascular ectasia)    GERD (gastroesophageal reflux disease)    Graves disease    Headache(784.0)    "related to allergies; more at different times during the year" (02/20/2012)   Hemorrhoids    Hiatal hernia    back and neck   History of blood transfusion 2023   1 unit, 5 units of Iron   Hx of adenomatous colonic polyps 04/12/2016   Hypercholesteremia    good cholesterol is high   Hypothyroidism    IBS (irritable bowel syndrome)    Moderate persistent asthma    -FeV1 72% 2011, -IgE 102 2011, CT sinus Neg 2011   Osteomyelitis of second toe of right foot ([place]) 09/23/2020   Osteoporosis    on reclast yearly   Peripheral vascular disease ([place]) 2019   DVTs (3) lungs and leg   Pneumonia 04/2011; ~ 11/2011   "double; single" (02/20/2012)   Septic olecranon bursitis of right elbow 09/22/2020   Seronegative rheumatoid arthritis ([place])    Dr. Lahoma Rocker   SIRS (systemic inflammatory response syndrome) ([place])  02/10/2018   Sleep apnea    uses cpap nightly   Tracheobronchomalacia    Past Surgical History:  Procedure Laterality Date   ABDOMINAL HYSTERECTOMY N/A    Phreesia 10/21/2019   ANTERIOR AND POSTERIOR REPAIR  1990's   APPENDECTOMY     ARGON LASER APPLICATION  08/81/1031   Procedure: ARGON LASER APPLICATION;  Surgeon: Yetta Flock, MD;  Location: [facility];  Service: Gastroenterology;;   BREAST LUMPECTOMY  1998   left   BREAST SURGERY N/A    Phreesia 10/21/2019   BRONCHIAL WASHINGS  04/05/2020   Procedure: BRONCHIAL WASHINGS;  Surgeon: Freddi Starr, MD;  Location: [facility];  Service: Pulmonary;;   CAPSULOTOMY Right 08/28/2021   Procedure: CAPSULOTOMY;  Surgeon: Felipa Furnace, DPM;  Location: [facility];  Service: Podiatry;  Laterality: Right;   CARPOMETACARPEL ([place]) FUSION OF THUMB WITH AUTOGRAFT FROM RADIUS  ~ 2009   "both thumbs" (02/20/2012)   CATARACT EXTRACTION W/ INTRAOCULAR LENS  IMPLANT, BILATERAL  2012   CERVICAL DISCECTOMY  10/2001   C5-C6   CERVICAL FUSION  2003   C3-C4   CHOLECYSTECTOMY     COLONOSCOPY     CORONARY STENT INTERVENTION N/A 01/11/2021   Procedure: CORONARY STENT INTERVENTION;  Surgeon: Leonie Man, MD;  Location: [facility]  INVASIVE CV LAB;  Service: Cardiovascular;  Laterality: N/A;   DEBRIDEMENT TENNIS ELBOW  ?1970's   right   ESOPHAGOGASTRODUODENOSCOPY     ESOPHAGOGASTRODUODENOSCOPY (EGD) WITH PROPOFOL N/A 02/05/2021   Procedure: ESOPHAGOGASTRODUODENOSCOPY (EGD) WITH PROPOFOL;  Surgeon: Yetta Flock, MD;  [facility];  Service: Gastroenterology;  Laterality: N/A;   EYE SURGERY N/A    Phreesia 10/21/2019   HAMMER TOE SURGERY Right 08/28/2021   Procedure: HAMMER TOE REPAIR 2ND TOE RIGHT FOOT;  Surgeon: Felipa Furnace, DPM;  Location: [facility];  Service: Podiatry;  Laterality: Right;   HYSTERECTOMY     I & D EXTREMITY Right 09/22/2020   Procedure: IRRIGATION AND DEBRIDEMENT RIGHT HAND AND ELBOW;  Surgeon: Marchia Bond, MD;   Location: [facility];  Service: Orthopedics;  Laterality: Right;   KNEE ARTHROPLASTY  ?1990's   "?right; w/cartilage repair" (02/20/2012)   MASS EXCISION Left 08/28/2021   Procedure: EXCISION OF SOFT TISSUE  MASS LEFT FOOT;  Surgeon: Felipa Furnace, DPM;  Location: [facility];  Service: Podiatry;  Laterality: Left;   NASAL SEPTUM SURGERY  1980's   POSTERIOR CERVICAL FUSION/FORAMINOTOMY  2004   "failed initial fusion; rewired  anterior neck" (02/20/2012)   REVERSE SHOULDER ARTHROPLASTY Right 02/16/2020   Procedure: REVERSE SHOULDER ARTHROPLASTY;  Surgeon: Marchia Bond, MD;  [facility];  Service: Orthopedics;  Laterality: Right;   RIGHT/LEFT HEART CATH AND CORONARY ANGIOGRAPHY N/A 01/11/2021   Procedure: RIGHT/LEFT HEART CATH AND CORONARY ANGIOGRAPHY;  Surgeon: Leonie Man, MD;  Location: [facility];  Service: Cardiovascular;  Laterality: N/A;   SPINE SURGERY N/A    Phreesia 10/21/2019   TONSILLECTOMY  ~ 1953   VESICOVAGINAL FISTULA CLOSURE W/ TAH  1988   VIDEO BRONCHOSCOPY Bilateral 08/23/2016   Procedure: VIDEO BRONCHOSCOPY WITH FLUORO;  Surgeon: Javier Glazier, MD;  [facility];  Service: Cardiopulmonary;  Laterality: Bilateral;   VIDEO BRONCHOSCOPY N/A 04/05/2020   Procedure: VIDEO BRONCHOSCOPY WITHOUT FLUORO;  Surgeon: Freddi Starr, MD;  [facility];  Service: Pulmonary;  Laterality: N/A;   Social History   Social History Narrative   Patient lives at home alone. Patient  divorced.    Patient has her BS degree.   Right handed.   Caffeine- sometimes coffee.      [facility]:   Born in [place], [place]. She worked as a Copywriter, advertising. She has no pets currently. She does have indoor plants. Previously had mold in her home that was remediated. Carpet was removed.          family history includes Allergies in her father and mother; Arthritis in her father and mother; Colitis in her daughter; Colon cancer in an other family member; Diabetes in  her maternal grandfather and mother; Heart disease in her brother, father, and mother; Lung cancer in her mother; Stroke in her father.   Review of Systems   Objective:   Physical Exam BP (!) 104/48   Pulse 62   Ht _0  (1.549 m)   Wt 141 lb (64 kg)   BMI 26.64 kg/m  Chronically ill cushingoid white woman in no acute distress, ambulates slowly to the exam table  Heart sounds normal S1-S2 no rubs murmurs or gallops, anterior lung fields are clear   The abdomen is protuberant somewhat obese with prominent superficial veins, it is soft bowel sounds are present it is nontender.  There is some increased tympany.   Data reviewed please see HPI briefly though I have reviewed  recent 2023 allergy notes, pulmonary notes, labs and imaging from this year and hematology notes

## 2022-01-08 NOTE — Telephone Encounter (Signed)
Next Visit: due 02/2022, message sent to the front desk to schedule.  Last Visit: 11/29/2021  Last Fill: 11/29/2021   DX: Fibromyalgia   Current Dose per office note on 11/29/2021: Previously tolerated gabapentin 600 mg TID this was reduced due to concern of dizziness or drowsiness side effect. Recommend trying to increase to 300 mg dose for now try increase at night first and monitor for side effects.    Okay to refill gabapentin?

## 2022-01-08 NOTE — Patient Instructions (Addendum)
We have given you samples of xifaxan today. Take one tablet three times a day for 14 days.   Due to recent changes in healthcare laws, you may see the results of your imaging and laboratory studies on MyChart before your provider has had a chance to review them.  We understand that in some cases there may be results that are confusing or concerning to you. Not all laboratory results come back in the same time frame and the provider may be waiting for multiple results in order to interpret others.  Please give [place] 48 hours in order for your provider to thoroughly review all the results before contacting the office for clarification of your results.    I appreciate the opportunity to care for you. Silvano Rusk, MD

## 2022-01-17 IMAGING — US US RENAL
1 series · 14 of 25 positions shown · non-contrast
Comparison: CT Abdomen and Pelvis 02/10/2018.

CLINICAL DATA: 69-year-old female with renal insufficiency.

EXAM:
RENAL / URINARY TRACT ULTRASOUND COMPLETE

[Series 1: us renal · 0.23mm/px · 14 of 41 slices shown]
[im 1/41]
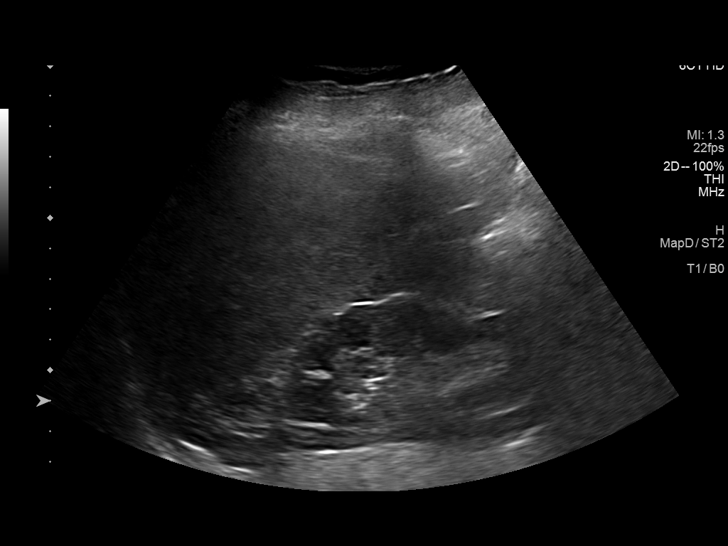
[im 4/41]
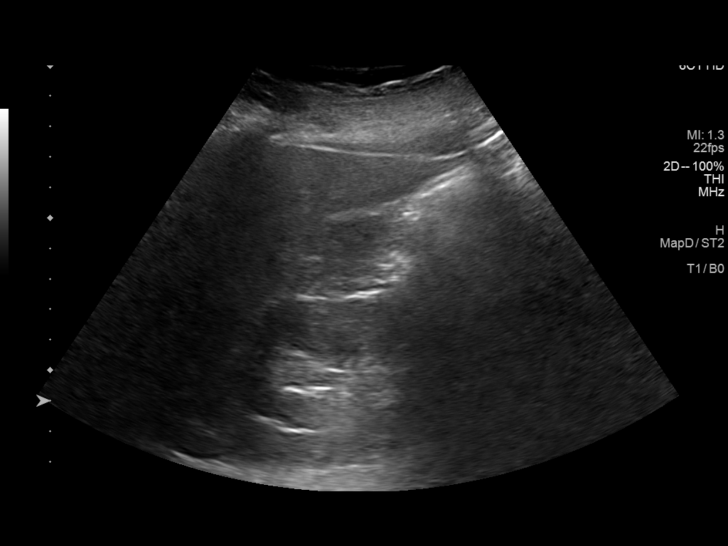
[im 7/41]
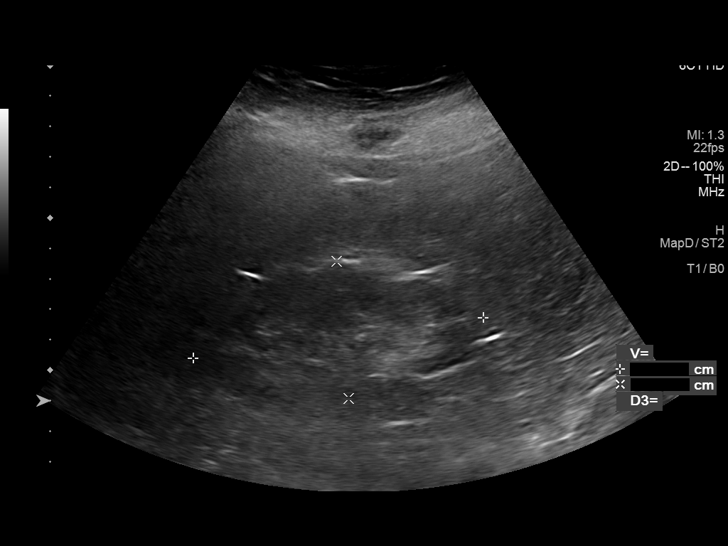
[im 11/41]
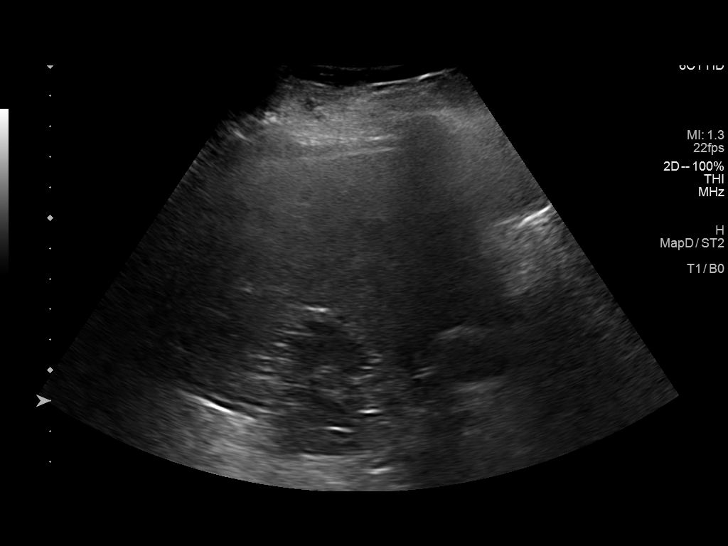
[im 14/41]
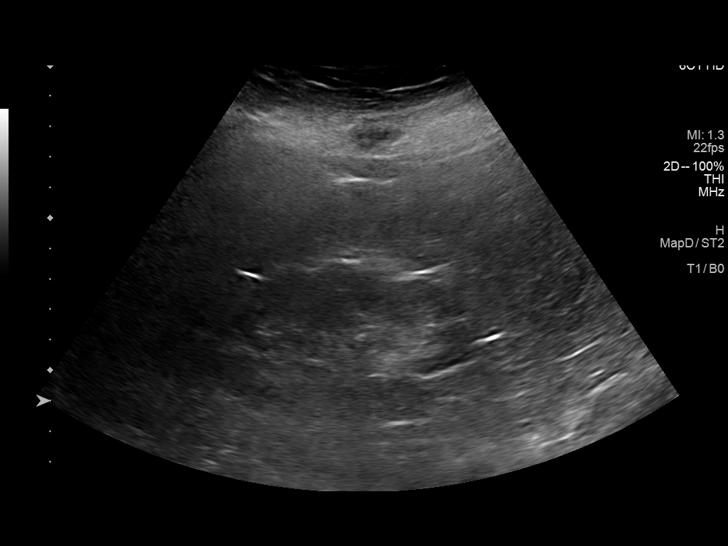
[im 16/41]
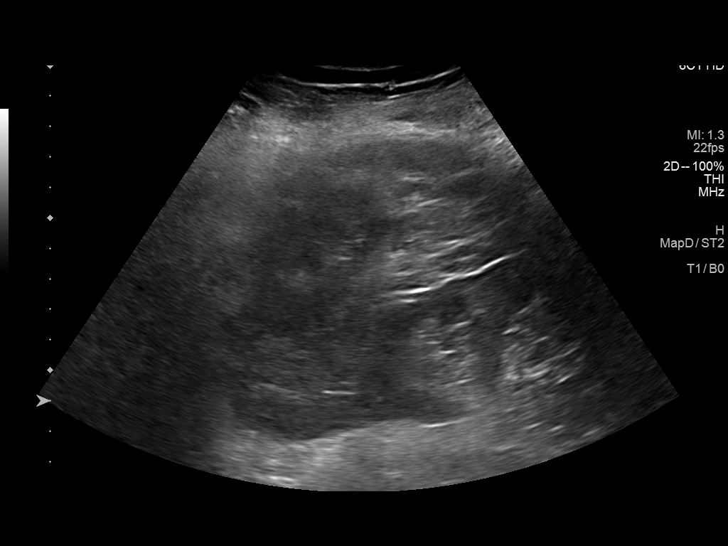
[im 19/41]
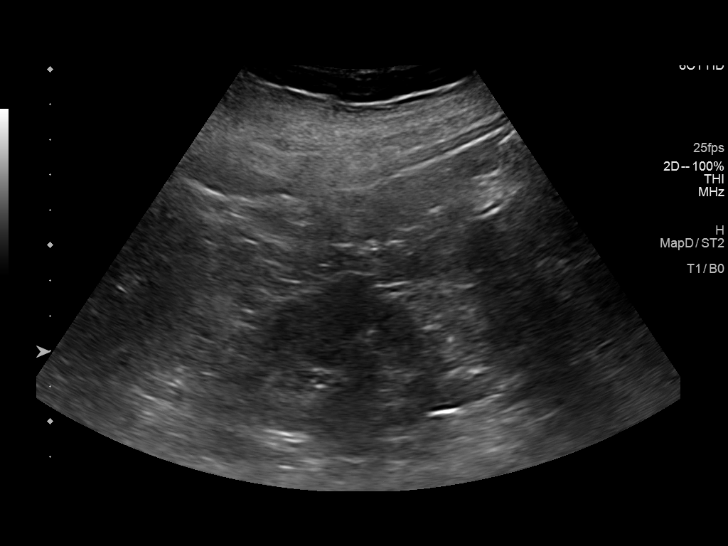
[im 22/41]
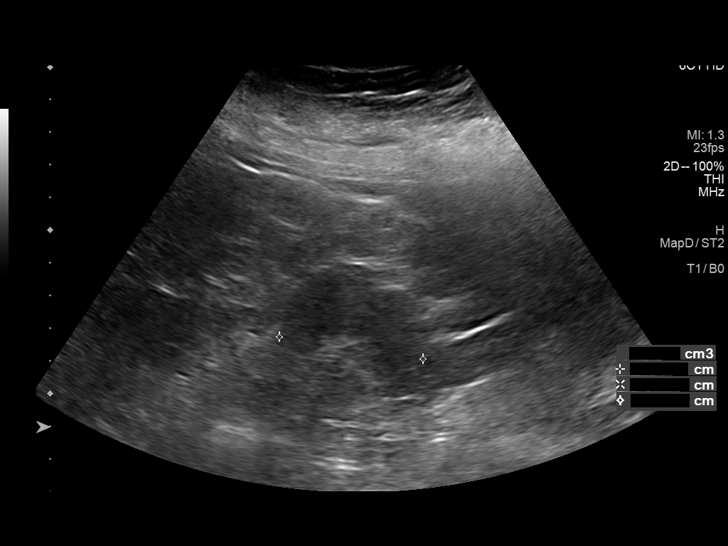
[im 26/41]
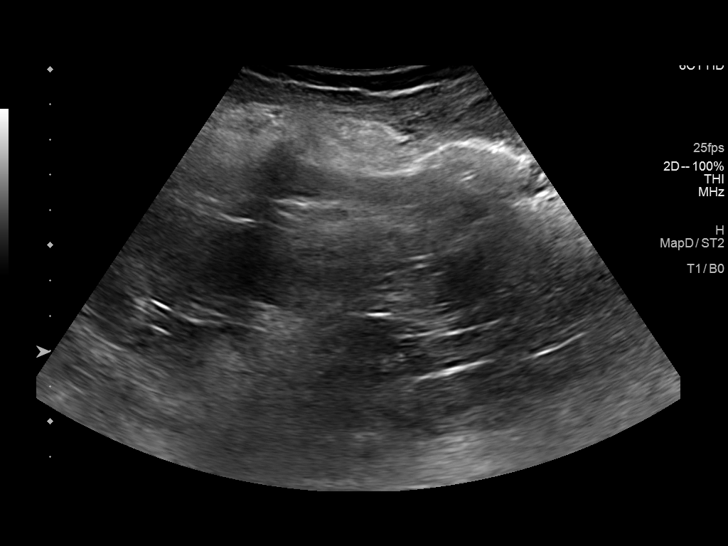
[im 27/41]
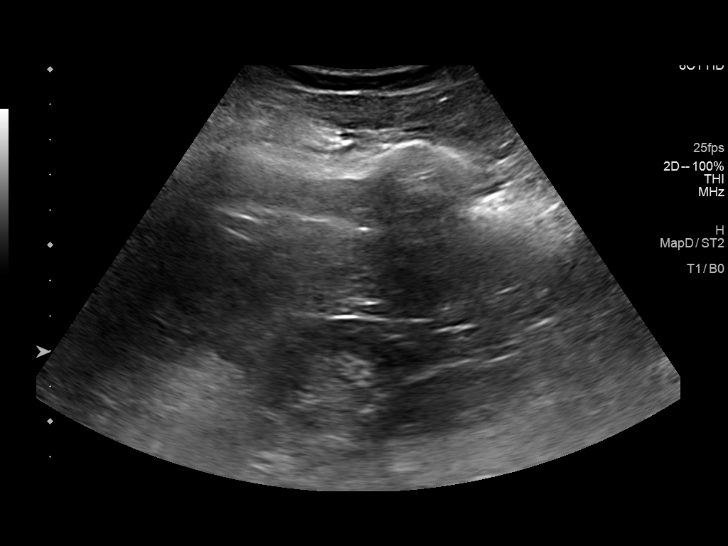
[im 31/41]
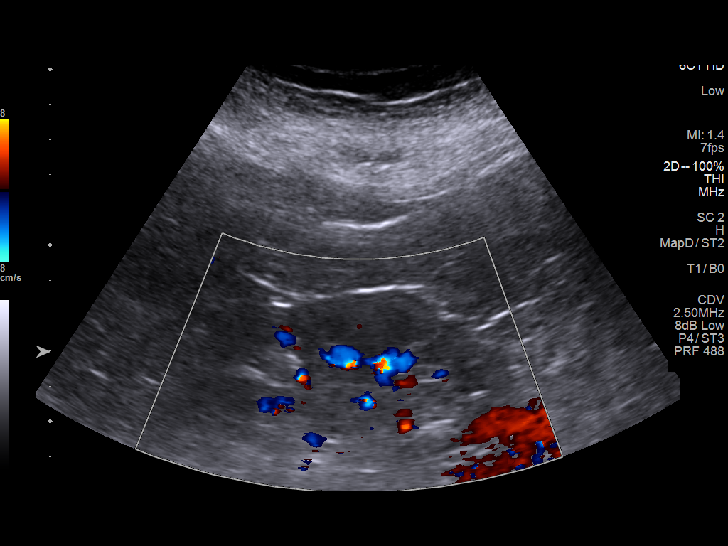
[im 34/41]
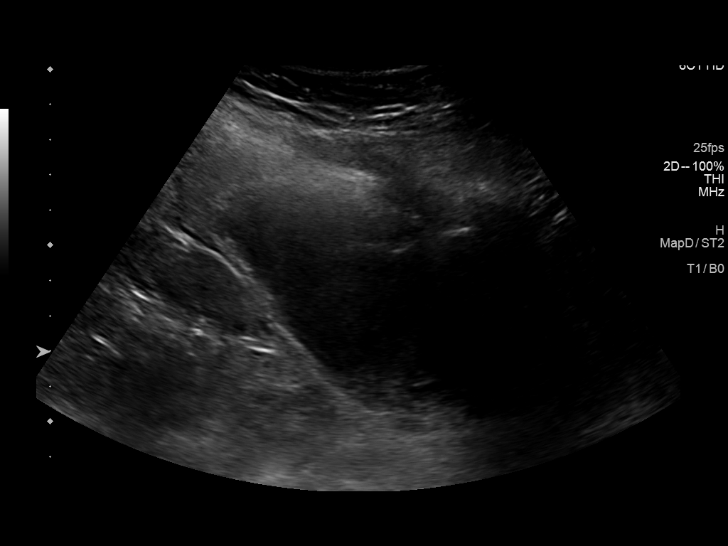
[im 37/41]
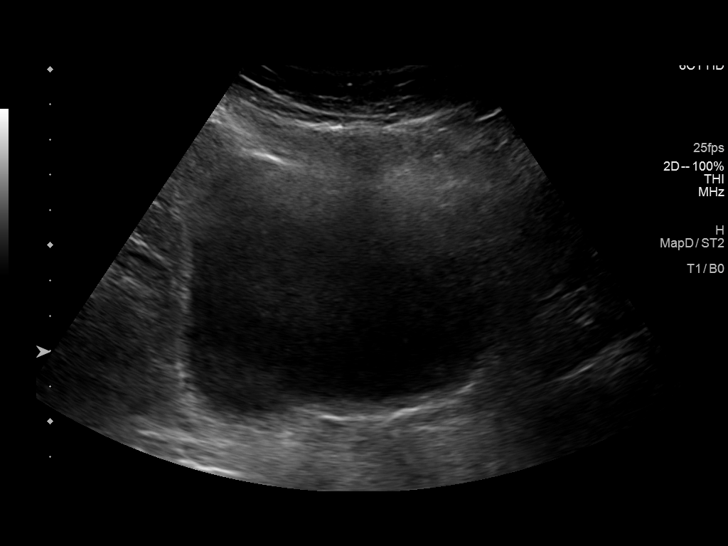
[im 41/41]
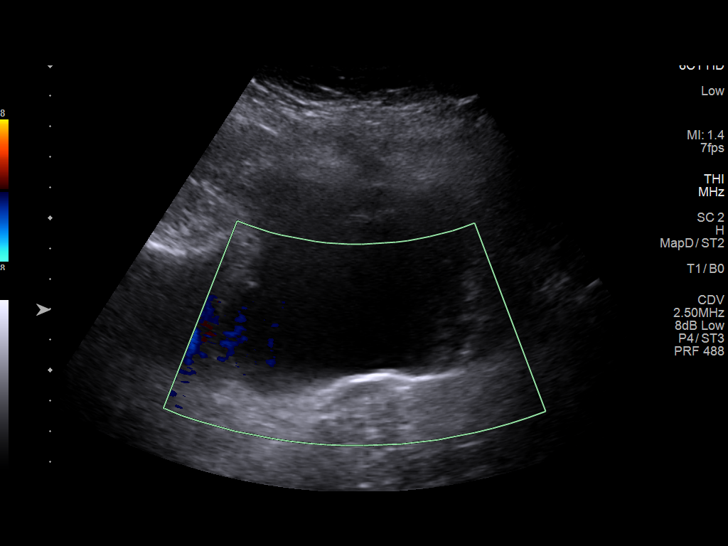

[14 of 25 positions shown; findings below may reference images not displayed]

FINDINGS: Right Kidney:

Renal measurements: 9.6 x 4.5 x 4.6 cm = volume: 105 mL. Preserved
cortical echogenicity. No hydronephrosis. No right renal lesion
identified.

Left Kidney:

Renal measurements: 8.8 x 4.3 x 4.4 cm = volume: 89 mL. Preserved
cortical echogenicity. No left hydronephrosis or renal lesion.

Bladder:

Appears normal for degree of bladder distention. No urinary debris
is evident.

Other:

Echogenic liver (image 7).
IMPRESSION: 1. Negative ultrasound appearance of both kidneys and the urinary
bladder.
2. Evidence of hepatic steatosis.

## 2022-01-22 DIAGNOSIS — N1832 Chronic kidney disease, stage 3b: Secondary | ICD-10-CM | POA: Diagnosis not present

## 2022-01-26 ENCOUNTER — Telehealth: Payer: Self-pay

## 2022-01-26 DIAGNOSIS — E119 Type 2 diabetes mellitus without complications: Secondary | ICD-10-CM

## 2022-01-26 MED ORDER — EPINEPHRINE 0.3 MG/0.3ML IJ SOAJ: INTRAMUSCULAR | 1 refills | Status: DC

## 2022-01-26 NOTE — Telephone Encounter (Signed)
Patient called into the office stating that her epipen is about to run about and she needs a refill sent to [facility]. Refill has been sent to requested pharmacy.

## 2022-01-27 DIAGNOSIS — J398 Other specified diseases of upper respiratory tract: Secondary | ICD-10-CM | POA: Diagnosis not present

## 2022-01-27 DIAGNOSIS — J479 Bronchiectasis, uncomplicated: Secondary | ICD-10-CM | POA: Diagnosis not present

## 2022-01-27 DIAGNOSIS — J45901 Unspecified asthma with (acute) exacerbation: Secondary | ICD-10-CM | POA: Diagnosis not present

## 2022-01-27 DIAGNOSIS — R059 Cough, unspecified: Secondary | ICD-10-CM | POA: Diagnosis not present

## 2022-01-30 ENCOUNTER — Other Ambulatory Visit: Payer: Medicare PPO

## 2022-02-01 ENCOUNTER — Other Ambulatory Visit: Payer: Self-pay | Admitting: Family Medicine

## 2022-02-01 DIAGNOSIS — I5020 Unspecified systolic (congestive) heart failure: Secondary | ICD-10-CM

## 2022-02-01 NOTE — Telephone Encounter (Signed)
Unable to refill per protocol, Rx request is too soon. Last refill 12/22/21 for 30 and 5 refills. Will refuse.  Requested Prescriptions  Pending Prescriptions Disp Refills   JARDIANCE 10 MG TABS tablet [Pharmacy Med Name: Jardiance 10 mg tablet] 30 tablet 3    Sig: TAKE 1 TABLET BY MOUTH DAILY BEFORE BREAKFAST     Endocrinology:  Diabetes - SGLT2 Inhibitors Failed - 02/01/2022  8:04 AM      Failed - Cr in normal range and within 360 days    Creatinine  Date Value Ref Range Status  12/21/2021 1.93 (H) 0.44 - 1.00 mg/dL Final   Creat  Date Value Ref Range Status  08/14/2021 1.37 (H) 0.60 - 1.00 mg/dL Final   Creatinine, Urine  Date Value Ref Range Status  05/31/2014 17.81 (L) >20.0 mg/dL Final         Failed - eGFR in normal range and within 360 days    GFR, Est African American  Date Value Ref Range Status  08/25/2020 40 (L) > OR = 60 mL/min/1.16m Final   GFR, Est Non African American  Date Value Ref Range Status  08/25/2020 34 (L) > OR = 60 mL/min/1.798mFinal   GFR, Estimated  Date Value Ref Range Status  12/21/2021 27 (L) >60 mL/min Final    Comment:    (NOTE) Calculated using the CKD-EPI Creatinine Equation (2021)    GFR  Date Value Ref Range Status  05/03/2021 38.39 (L) >60.00 mL/min Final    Comment:    Calculated using the CKD-EPI Creatinine Equation (2021)   eGFR  Date Value Ref Range Status  08/14/2021 41 (L) > OR = 60 mL/min/1.7366minal    Comment:    The eGFR is based on the CKD-EPI 2021 equation. To calculate  the new eGFR from a previous Creatinine or Cystatin C result, go to https://www.kidney.org/professionals/ kdoqi/gfr%5Fcalculator   02/03/2021 51 (L) >59 mL/min/1.73 Final         Failed - Valid encounter within last 6 months    Recent Outpatient Visits           8 months ago Chronic fatigue   BroColmesneilckard, WarCammie McgeeD   11 months ago Anemia, unspecified type   BroWillmarcDennard SchaumannarCammie McgeeD    11 months ago Anemia, unspecified type   BroJellicocSusy FrizzleD   1 year ago Type 2 diabetes mellitus with other specified complication, unspecified whether long term insulin use (HCCPortal BroCoachellacSusy FrizzleD   1 year ago Chronic combined systolic and diastolic CHF (congestive heart failure) (HCCSebastian BroJonni Sangermily Medicine [name], WarCammie McgeeD       Future Appointments             In 1 month Rice, ChrResa MinerD [facility] [place]. Con[place] LakeIn 2 months ChrBuford DresserD [facility]ology, [facility]            Passed - HBA1C is between 0 and 7.9 and within 180 days    Hgb A1c MFr Bld  Date Value Ref Range Status  08/14/2021 6.3 (H) <5.7 % of total Hgb Final    Comment:    For someone without known diabetes, a hemoglobin  A1c value between 5.7% and 6.4% is consistent with prediabetes and should be confirmed with a  follow-up test. .  For someone with known diabetes, a value <7% indicates that their diabetes is well controlled. A1c targets should be individualized based on duration of diabetes, age, comorbid conditions, and other considerations. . This assay result is consistent with an increased risk of diabetes. . Currently, no consensus exists regarding use of hemoglobin A1c for diagnosis of diabetes for children. .           folic acid (FOLVITE) 1 MG tablet [Pharmacy Med Name: folic acid 1 mg tablet] 90 tablet 3    Sig: TAKE 1 TABLET BY MOUTH DAILY     Endocrinology:  Vitamins Passed - 02/01/2022  8:04 AM      Passed - Valid encounter within last 12 months    Recent Outpatient Visits           8 months ago Chronic fatigue   [place] Dennard Schaumann, Cammie Mcgee, MD   11 months ago Anemia, unspecified type   [place] [name], Cammie Mcgee, MD   11 months ago Anemia, unspecified type   [place]  [name], MD   1 year ago Type 2 diabetes mellitus with other specified complication, unspecified whether long term insulin use ([place])   [place] [name], MD   1 year ago Chronic combined systolic and diastolic CHF (congestive heart failure) ([place])   [facility] [name], Cammie Mcgee, MD       Future Appointments             In 1 month Rice, Resa Miner, MD [facility] A Dept Of [place]. [place]   In 2 months Buford Dresser, MD [facility], [facility]

## 2022-02-10 ENCOUNTER — Other Ambulatory Visit: Payer: Self-pay | Admitting: Internal Medicine

## 2022-02-10 DIAGNOSIS — M0600 Rheumatoid arthritis without rheumatoid factor, unspecified site: Secondary | ICD-10-CM

## 2022-02-14 NOTE — Telephone Encounter (Signed)
Next Visit: 03/07/2022  Last Visit: 11/29/2021   Last Fill: 11/22/2021  Dx:  Rheumatoid arthritis with negative rheumatoid factor, involving unspecified site   Current Dose per office note on 11/29/2021: prednisone with gradual tapering but increased back to 7 mg daily after last visit   Okay to refill prednisone?

## 2022-02-15 ENCOUNTER — Other Ambulatory Visit: Payer: Self-pay | Admitting: Family Medicine

## 2022-02-16 ENCOUNTER — Telehealth: Payer: Self-pay | Admitting: Family Medicine

## 2022-02-16 ENCOUNTER — Other Ambulatory Visit: Payer: Self-pay

## 2022-02-16 DIAGNOSIS — D539 Nutritional anemia, unspecified: Secondary | ICD-10-CM

## 2022-02-16 DIAGNOSIS — N1832 Chronic kidney disease, stage 3b: Secondary | ICD-10-CM

## 2022-02-16 MED ORDER — FOLIC ACID 1 MG PO TABS: 1.0000 mg | ORAL_TABLET | Freq: Every day | ORAL | 3 refills | Status: DC

## 2022-02-16 NOTE — Telephone Encounter (Signed)
Prescription Request  02/16/2022  Is this a "Controlled Substance" medicine? No  LOV: Visit date not found  What is the name of the medication or equipment?   folic acid (FOLVITE) 1 MG tablet   Have you contacted your pharmacy to request a refill? Yes   Which pharmacy would you like this sent to?  [facility] - [place], [place] [address] Phone: [phone] Fax: [phone]    Patient notified that their request is being sent to the clinical staff for review and that they should receive a response within 2 business days.   Please advise at  [phone]. **Requesting refill be sent in today if possible.**

## 2022-03-06 NOTE — Progress Notes (Signed)
Office Visit Note  Patient: Ebony Scott             Date of Birth: 07-10-1950           MRN: [id number]             PCP: [name], MD Referring: [name], MD Visit Date: 03/07/2022   Subjective:  Follow-up (Bil hand pain)   History of Present Illness: Ebony Scott is a 71 y.o. 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 She has increased joint pain in multiple areas especially bilateral hands also left shoulder and right knee. She has seen only a small benefit in symptoms so far after the knee viscosupplementation shots last year. She has pain pretty much all the time both morning and night time. She notices some worsening of lateral deviation in the small finger on her right hand.   Previous HPI 11/29/21 Ebony Scott is a 72 y.o. female here for follow up for seronegative RA and cushing's syndrome currently on long term prednisone. She is on hizentra for hypogammaglobulinemia. Since last visit she had a bone marrow biopsy for macrocytosis with benign findings. She is having an exacerbation of asthma with wheezing and coughing increased since early September and did not improve well with a prolonged steroid taper. Subsequently has not been prescribed azithromycin and higher dose steroid starting about 5 days ago so far still having a lot of coughing and symptoms. She notices increased joint pain in hands and knees during this episode. She completed a series of 3 viscosupplementation injections in the left knee about 2 weeks ago with a partial benefit.   Previous HPI 08/09/2021 Ebony Scott is a 72 y.o. female here for follow up or seronegative RA and cushing's syndrome on prednisone with gradual tapering but increased back to 7 mg daily after last visit.  She is having some increased joint pain at multiple areas but worst problem is increased pain in her left knee.  This is severe enough to limit her tolerance for weightbearing and mobility.  She  is continuing treatments for hypogammaglobulinemia with the Hizentra subcu weekly.  She has not had any severe interval infections.  She is scheduled for hammertoe corrective surgery next month.   Previous HPI 04/28/21 Ebony Scott is a 72 y.o. female here for follow up for seronegative RA and cushing's syndrome on prednisone with gradual tapering from 8 mg in December. She developed worsening iron deficiency anemia after GI bleed getting venofer replacement. Joint and skin problems were remaining pretty stable until about 2 weeks ot a month ago. This was after decreasing prednisone to 6 mg daily dose. Knee pain limits her mobility but hands have the most increase in swelling. Skin rash on the back of her neck and throughout her low back. She tried topical hydrocortisone without a large improvement so far.   Previous HPI 01/23/21 Ebony Scott is a 72 y.o. female here for follow up for seronegative RA minimally responsive to multiple DMARD treatments on prednisone 10 mg PO daily with acquired cushing's syndrome. She has noticed some increase in right hand MCP joint swelling but overall joints not dramatically changed with dose reduction. Since our visit she went for coronary artery stent placement after study for evaluating cardiomyopathy found significant stenosis. No anginal symptoms, she is not sure if any different but husband reports noticing some improvement in her dyspnea on exertion. She has had numerous other  issues increased upper airway congestion and drainage. Sometimes productive coughing with this. She has had mild nasal bleeding with rhinitis and and sneezing issues.   Previous HPI 11/28/20 Ebony Scott is a 72 y.o. female here for iatrogenic cushing syndrome and history of rheumatoid arthritis with long term prednisone treatment, previously seen by Dr. Kathlene November at [facility] for this problem. Mostly had been taking treatments after hospitalization with severe joint inflammation in 2020. She has tried  taking numerous DMARD treatments for years including actemra, simponi, abatacept, and rituximab so far without great response or tolerance. Chronic prednisone required for symptoms throughout this time but has taken these longstanding for asthma disease already. She has had some issue with recurrent infections with pulmonary and septic bursitis and more recently noninfectious allergy symptoms managed with Dr. Neldon Mc and Erin Fulling. She started on IVIG treatments with Dr. Neldon Mc for this identified recently, not sure if primary or related to previous medication   DMARD Hx Rituximab Abatacept Simponi Actemra IV   Review of Systems  Constitutional:  Positive for fatigue.  HENT:  Positive for mouth dryness. Negative for mouth sores.   Eyes:  Positive for dryness.  Respiratory:  Positive for shortness of breath.   Cardiovascular:  Negative for chest pain and palpitations.  Gastrointestinal:  Negative for blood in stool, constipation and diarrhea.  Endocrine: Negative for increased urination.  Genitourinary:  Negative for involuntary urination.  Musculoskeletal:  Positive for joint pain, gait problem, joint pain, joint swelling, myalgias, muscle weakness, morning stiffness, muscle tenderness and myalgias.  Skin:  Positive for hair loss. Negative for color change, rash and sensitivity to sunlight.  Allergic/Immunologic: Positive for susceptible to infections.  Neurological:  Positive for dizziness and headaches.  Hematological:  Negative for swollen glands.  Psychiatric/Behavioral:  Positive for sleep disturbance. Negative for depressed mood. The patient is not nervous/anxious.     PMFS History:  Patient Active Problem List   Diagnosis Date Noted   Effusion, left knee 08/09/2021   Noninfectious otitis externa of left ear 06/08/2021   Rash and other nonspecific skin eruption 04/28/2021   Iron deficiency anemia 02/24/2021   GAVE (gastric antral vascular ectasia)    Symptomatic anemia 02/04/2021    Anticoagulated    Antiplatelet or antithrombotic long-term use    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[place])  01/11/2021   Coronary artery calcification seen on CAT scan 01/11/2021   ARF (acute renal failure) ([place]) 10/04/2020   Physical deconditioning 09/25/2020   History of DVT (deep vein thrombosis) 09/24/2020   DMII (diabetes mellitus, type 2) ([place]) 09/23/2020   Cellulitis of right hand 09/22/2020   Abscess of dorsum of right hand 09/22/2020   Hoarseness 76/73/4193   Metabolic encephalopathy 79/03/4095   Acute encephalopathy    AMS (altered mental status) 02/23/2020   S/P reverse total shoulder arthroplasty, right    Hypokalemia    DOE (dyspnea on exertion) 02/18/2020   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   S/P reverse total shoulder arthroplasty, left 02/16/2020   Preoperative clearance 01/26/2020   Closed displaced fracture of metatarsal bone of right foot 01/20/2019   History of pulmonary embolus (PE) 12/19/2018   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   Rheumatoid arthritis ([place])    Bilateral impacted cerumen 08/18/2018   Fungal otitis externa 08/18/2018   Cerumen debris on tympanic membrane of right ear 08/18/2018   Asthmatic bronchitis with exacerbation 07/29/2018   Pulmonary embolism ([place]) 07/29/2018   Cellulitis of forearm    Fever of unknown origin (FUO) 02/10/2018   Abdominal pain  02/10/2018   Fracture of base of fifth metatarsal bone with routine healing, left    Closed fracture of fifth metatarsal bone of right foot, initial encounter 01/15/2018   DVT (deep venous thrombosis) ([place]) 01/14/2018   Hypothyroidism 01/14/2018   Depression 01/14/2018   Debility    Malnutrition of moderate degree 12/17/2017   Knee pain    History of breast cancer    Chronic pain syndrome    Fibromyalgia    Graves disease    Vasculitis ([place]) 12/13/2017   Rhabdomyolysis 12/13/2017   Dysphonia 08/10/2017   Pulmonary nodules    Chronic kidney disease, stage 3b ([place]) 04/15/2016    Hx of adenomatous colonic polyps 04/12/2016   Cough 02/09/2016   Opacity of lung on imaging study 11/03/2015   Severe persistent asthma 02/08/2015   Facial pain syndrome 02/08/2015   Insomnia 02/08/2015   Other allergic rhinitis 02/08/2015   LPRD (laryngopharyngeal reflux disease) 02/08/2015   Chronic combined systolic and diastolic CHF (congestive heart failure) ([place]) 02/08/2015   Benign paroxysmal positional vertigo 12/03/2012   Dizziness and giddiness 10/29/2012   Disequilibrium 10/29/2012   Pseudogout 02/24/2012   Pneumonia 05/31/2011   Irritable bowel syndrome 01/02/2010   Hyperlipidemia 12/23/2009   Hyperthyroidism 11/12/2006   Seasonal and perennial allergic rhinitis 11/12/2006   GERD 11/12/2006   NECK PAIN, CHRONIC 11/12/2006   OSTEOPOROSIS 11/12/2006   BREAST CANCER, HX OF 11/12/2006    Past Medical History:  Diagnosis Date   Abscess of dorsum of right hand 09/22/2020   Acute deep vein thrombosis (DVT) of right lower extremity ([place]) 12/13/2017   Allergic rhinitis    Anemia    Angio-edema    Anxiety    pt denies dx   Arthritis    Phreesia 10/21/2019   Asthma    Phreesia 10/21/2019   Breast cancer ([place]) 1998   Left breast, in remission   Bronchiectasis ([place])    Cataract    REMOVED   CHF (congestive heart failure) ([place])    Clostridium difficile colitis 11/11/3005   Complication of anesthesia    "had hard time waking up from it several times" (02/20/2012), no anesthesia problems since 2014   Coronary artery disease    Depression    "some; don't take anything for it" (02/20/2012), pt denies dx as of 06/02/21   Diverticulosis    DVT (deep venous thrombosis) ([place])    Dyspnea    all the time   Fibromyalgia 11/2011   GAVE (gastric antral vascular ectasia)    GERD (gastroesophageal reflux disease)    Graves disease    Headache(784.0)    "related to allergies; more at different times during the year" (02/20/2012)   Hemorrhoids    Hiatal hernia    back and neck    History of blood transfusion 2023   1 unit, 5 units of Iron   Hx of adenomatous colonic polyps 04/12/2016   Hypercholesteremia    good cholesterol is high   Hypothyroidism    IBS (irritable bowel syndrome)    Moderate persistent asthma    -FeV1 72% 2011, -IgE 102 2011, CT sinus Neg 2011   Osteomyelitis of second toe of right foot ([place]) 09/23/2020   Osteoporosis    on reclast yearly   Peripheral vascular disease ([place]) 2019   DVTs (3) lungs and leg   Pneumonia 04/2011; ~ 11/2011   "double; single" (02/20/2012)   Septic olecranon bursitis of right elbow 09/22/2020   Seronegative rheumatoid arthritis ([place])    Dr.  Govinda Aryal   SIRS (systemic inflammatory response syndrome) ([facility]) 02/10/2018   Sleep apnea    uses cpap nightly   Tracheobronchomalacia     Family History  Problem Relation Age of Onset   Allergies Mother    Heart disease Mother    Arthritis Mother    Lung cancer Mother        heavy smoker   Diabetes Mother    Allergies Father    Heart disease Father    Arthritis Father    Stroke Father    Heart disease Brother    Diabetes Maternal Grandfather    Colitis Daughter    Colon cancer Other        Maternal half uncle   Past Surgical History:  Procedure Laterality Date   ABDOMINAL HYSTERECTOMY N/A    Phreesia 10/21/2019   ANTERIOR AND POSTERIOR REPAIR  1990's   APPENDECTOMY     ARGON LASER APPLICATION  24/26/8341   Procedure: ARGON LASER APPLICATION;  Surgeon: Yetta Flock, MD;  Location: [facility];  Service: Gastroenterology;;   BREAST LUMPECTOMY  1998   left   BREAST SURGERY N/A    Phreesia 10/21/2019   BRONCHIAL WASHINGS  04/05/2020   Procedure: BRONCHIAL WASHINGS;  Surgeon: Freddi Starr, MD;  Location: [facility];  Service: Pulmonary;;   CAPSULOTOMY Right 08/28/2021   Procedure: CAPSULOTOMY;  Surgeon: Felipa Furnace, DPM;  Location: [facility];  Service: Podiatry;  Laterality: Right;   CARPOMETACARPEL ([place]) FUSION OF THUMB WITH AUTOGRAFT FROM  RADIUS  ~ 2009   "both thumbs" (02/20/2012)   CATARACT EXTRACTION W/ INTRAOCULAR LENS  IMPLANT, BILATERAL  2012   CERVICAL DISCECTOMY  10/2001   C5-C6   CERVICAL FUSION  2003   C3-C4   CHOLECYSTECTOMY     COLONOSCOPY     CORONARY STENT INTERVENTION N/A 01/11/2021   Procedure: CORONARY STENT INTERVENTION;  Surgeon: Leonie Man, MD;  Location: [facility];  Service: Cardiovascular;  Laterality: N/A;   DEBRIDEMENT TENNIS ELBOW  ?1970's   right   ESOPHAGOGASTRODUODENOSCOPY     ESOPHAGOGASTRODUODENOSCOPY (EGD) WITH PROPOFOL N/A 02/05/2021   Procedure: ESOPHAGOGASTRODUODENOSCOPY (EGD) WITH PROPOFOL;  Surgeon: Yetta Flock, MD;  [facility];  Service: Gastroenterology;  Laterality: N/A;   EYE SURGERY N/A    Phreesia 10/21/2019   HAMMER TOE SURGERY Right 08/28/2021   Procedure: HAMMER TOE REPAIR 2ND TOE RIGHT FOOT;  Surgeon: Felipa Furnace, DPM;  Location: [facility];  Service: Podiatry;  Laterality: Right;   HYSTERECTOMY     I & D EXTREMITY Right 09/22/2020   Procedure: IRRIGATION AND DEBRIDEMENT RIGHT HAND AND ELBOW;  Surgeon: Marchia Bond, MD;  Location[facility];  Service: Orthopedics;  Laterality: Right;   KNEE ARTHROPLASTY  ?1990's   "?right; w/cartilage repair" (02/20/2012)   MASS EXCISION Left 08/28/2021   Procedure: EXCISION OF SOFT TISSUE  MASS LEFT FOOT;  Surgeon: Felipa Furnace, DPM;  Location: [facility];  Service: Podiatry;  Laterality: Left;   NASAL SEPTUM SURGERY  1980's   POSTERIOR CERVICAL FUSION/FORAMINOTOMY  2004   "failed initial fusion; rewired  anterior neck" (02/20/2012)   REVERSE SHOULDER ARTHROPLASTY Right 02/16/2020   Procedure: REVERSE SHOULDER ARTHROPLASTY;  Surgeon: Marchia Bond, MD;  [facility];  Service: Orthopedics;  Laterality: Right;   RIGHT/LEFT HEART CATH AND CORONARY ANGIOGRAPHY N/A 01/11/2021   Procedure: RIGHT/LEFT HEART CATH AND CORONARY ANGIOGRAPHY;  Surgeon: Leonie Man, MD;  Location: [facility];  Service:  Cardiovascular;  Laterality: N/A;   SPINE SURGERY N/A    Phreesia 10/21/2019   TONSILLECTOMY  ~ 1953   VESICOVAGINAL FISTULA CLOSURE W/ TAH  1988   VIDEO BRONCHOSCOPY Bilateral 08/23/2016   Procedure: VIDEO BRONCHOSCOPY WITH FLUORO;  Surgeon: Javier Glazier, MD;  [facility];  Service: Cardiopulmonary;  Laterality: Bilateral;   VIDEO BRONCHOSCOPY N/A 04/05/2020   Procedure: VIDEO BRONCHOSCOPY WITHOUT FLUORO;  Surgeon: Freddi Starr, MD;  [facility];  Service: Pulmonary;  Laterality: N/A;   Social History   Social History Narrative   Patient lives at home alone. Patient  divorced.    Patient has her BS degree.   Right handed.   Caffeine- sometimes coffee.      [facility]:   Born in [place], [place]. She worked as a Copywriter, advertising. She has no pets currently. She does have indoor plants. Previously had mold in her home that was remediated. Carpet was removed.          Immunization History  Administered Date(s) Administered   DTaP 08/18/2013   Fluad Quad(high Dose 65+) 10/16/2018, 11/20/2019, 11/03/2020   Hepatitis A 09/04/2007, 03/02/2008   Hepatitis B 01/07/1985, 02/06/1985, 08/17/1985   Influenza Split 11/13/2010, 11/22/2011, 10/20/2012   Influenza Whole 11/14/2009, 11/21/2011   Influenza, High Dose Seasonal PF 11/09/2015, 11/27/2017   Influenza, Quadrivalent, Recombinant, Inj, Pf 11/20/2019   Influenza,inj,Quad PF,6+ Mos 11/06/2013, 11/09/2014, 11/06/2016   Influenza-Unspecified 11/06/2021   Meningococcal Conjugate 09/04/2007   PFIZER(Purple Top)SARS-COV-2 Vaccination 03/12/2019, 04/01/2019, 12/16/2019, 06/29/2020   Pfizer Covid-19 Vaccine Bivalent Booster 59yr & up 03/02/2021   Pneumococcal Conjugate-13 05/18/2013   Pneumococcal Polysaccharide-23 01/05/1994, 11/28/2011, 05/27/2017   Respiratory Syncytial Virus Vaccine,Recomb Aduvanted(Arexvy) 11/06/2021   Td 07/24/1995, 03/16/2005   Tdap 08/18/2013   Zoster Recombinat (Shingrix)  09/18/2017   Zoster, Live 03/02/2008     Objective: Vital Signs: BP (!) 118/52 (BP Location: Right Arm, Patient Position: Sitting, Cuff Size: Normal)   Pulse 72   Resp 20   Ht '5\' 1"'$  (1.549 m)   Wt 137 lb (62.1 kg)   BMI 25.89 kg/m    Physical Exam Eyes:     Conjunctiva/sclera: Conjunctivae normal.  Cardiovascular:     Rate and Rhythm: Normal rate and regular rhythm.  Pulmonary:     Effort: Pulmonary effort is normal.     Breath sounds: Normal breath sounds.  Musculoskeletal:     Right lower leg: No edema.     Left lower leg: No edema.  Skin:    General: Skin is warm and dry.     Findings: Bruising present.  Neurological:     Mental Status: She is alert.  Psychiatric:        Mood and Affect: Mood normal.      Musculoskeletal Exam:  Shoulders guarding against overhead abduction, ROM reduced on left shoulder with pain also tenderness to palpation Elbows full ROM no tenderness or swelling Wrists full ROM no tenderness or swelling MCP joint swelling on both hands, right 4th and 5th PIPs with chornic appearing thickening and 5th PIP lateral deviation and decreased flexion ROM, mild heberdon's nodes Knees tenderness to pressure no palpable swelling, crepitus present Ankles full ROM no tenderness or swelling   CDAI Exam: CDAI Score: -- Patient Global: --; Provider Global: -- Swollen: 10 ; Tender: 13  Joint Exam 03/07/2022      Right  Left  Glenohumeral   Tender   Tender  MCP 2  Swollen Tender  Swollen Tender  MCP 3  Swollen Tender  Swollen Tender  MCP 4  Swollen Tender  Swollen Tender  MCP 5  Swollen Tender  Swollen Tender  PIP 4  Swollen Tender     PIP 5  Swollen Tender     Knee      Tender     Investigation: No additional findings.  Imaging: No results found.  Recent Labs: Lab Results  Component Value Date   WBC 10.9 (H) 03/07/2022   HGB 11.9 03/07/2022   PLT 259 03/07/2022   NA 143 03/07/2022   K 4.8 03/07/2022   CL 103 03/07/2022   CO2 27  03/07/2022   GLUCOSE 133 (H) 03/07/2022   BUN 32 (H) 03/07/2022   CREATININE 1.68 (H) 03/07/2022   BILITOT 0.5 12/21/2021   ALKPHOS 60 12/21/2021   AST 49 (H) 12/21/2021   ALT 57 (H) 12/21/2021   PROT 7.3 12/21/2021   ALBUMIN 3.7 12/21/2021   CALCIUM 9.9 03/07/2022   GFRAA 40 (L) 08/25/2020    Speciality Comments: No specialty comments available.  Procedures:  No procedures performed Allergies: Baclofen, Dust mite extract, Molds & smuts, Morphine and related, Other, Penicillins, Rofecoxib, Shellfish allergy, Tetracycline hcl, Xolair [omalizumab], Zoledronic acid, Dilaudid [hydromorphone hcl], Levofloxacin, Oxycodone hcl, Paroxetine, Celecoxib, Diltiazem hcl, Hydrocodone-acetaminophen, Lactose intolerance (gi), Rituximab, Tree extract, Gamunex [immune globulin], and Penicillin g procaine   Assessment / Plan:     Visit Diagnoses: Rheumatoid arthritis with negative rheumatoid factor, involving unspecified site ([place]) - Plan: XR Hand 2 View Right, XR Hand 2 View Left, XR Shoulder Left, hydroxychloroquine (PLAQUENIL) 200 MG tablet, gabapentin (NEURONTIN) 300 MG capsule, Sedimentation rate, C-reactive protein, CBC with Differential/Platelet, BASIC METABOLIC PANEL WITH GFR  Inflammatory arthritis symptoms appear to be in exacerbation with definite increase in MTP joint swelling and warmth and tenderness present on exam today.  Also having somewhat generalized worsening symptoms.  She is noticing some progressive worsening of deforming changes on the hand we will obtain an updated x-rays today for any evidence of ongoing erosion.  Checking sedimentation rate and CRP as well for disease activity monitoring.  Unfortunately she is very high risk for additional DMARD treatment due to immunodeficiency status.  Will plan to continue the prednisone 7 mg daily and recommend she add hydroxychloroquine 200 mg daily.  Not sure whether or not she was on this medicine in the past prior to the multiple biologic  DMARD but does not have any documented intolerance or complications and she does not recall any personally.  Would also consider sulfasalazine as an alternative option with low impact on infection risk.  Chronic pain syndrome Fibromyalgia - Plan: gabapentin (NEURONTIN) 300 MG capsule  Continue gabapentin 300 mg for chronic pain and fibromyalgia.  She is needing a medication refill on tramadol but recommended her to follow-up with her PCP office for this.  By chart review looks like most recent prescription signed December 28 which should be active at this time.  Orders: Orders Placed This Encounter  Procedures   XR Hand 2 View Right   XR Hand 2 View Left   XR Shoulder Left   Sedimentation rate   C-reactive protein   CBC with Differential/Platelet   BASIC METABOLIC PANEL WITH GFR   Meds ordered this encounter  Medications   hydroxychloroquine (PLAQUENIL) 200 MG tablet    Sig: Take 1 tablet (200 mg total) by mouth daily.    Dispense:  30 tablet    Refill:  2   gabapentin (NEURONTIN) 300 MG capsule  Sig: TAKE 1 CAPSULE BY MOUTH 3 TIMES DAILY AS NEEDED    Dispense:  90 capsule    Refill:  2     Follow-Up Instructions: No follow-ups on file.   Collier Salina, MD  Note - This record has been created using [employer].  Chart creation errors have been sought, but may not always  have been located. Such creation errors do not reflect on  the standard of medical care.

## 2022-03-07 ENCOUNTER — Ambulatory Visit: Payer: Medicare PPO

## 2022-03-07 ENCOUNTER — Ambulatory Visit: Payer: Medicare PPO | Admitting: Podiatry

## 2022-03-07 ENCOUNTER — Encounter: Payer: Self-pay | Admitting: Internal Medicine

## 2022-03-07 ENCOUNTER — Ambulatory Visit (INDEPENDENT_AMBULATORY_CARE_PROVIDER_SITE_OTHER): Payer: Medicare PPO

## 2022-03-07 ENCOUNTER — Telehealth: Payer: Self-pay | Admitting: Family Medicine

## 2022-03-07 ENCOUNTER — Other Ambulatory Visit: Payer: Self-pay

## 2022-03-07 ENCOUNTER — Other Ambulatory Visit: Payer: Self-pay | Admitting: Family Medicine

## 2022-03-07 ENCOUNTER — Ambulatory Visit: Payer: Medicare PPO | Attending: Internal Medicine | Admitting: Internal Medicine

## 2022-03-07 VITALS — BP 138/80

## 2022-03-07 VITALS — BP 118/52 | HR 72 | Resp 20 | Ht 61.0 in | Wt 137.0 lb

## 2022-03-07 DIAGNOSIS — M25512 Pain in left shoulder: Secondary | ICD-10-CM | POA: Diagnosis not present

## 2022-03-07 DIAGNOSIS — Q828 Other specified congenital malformations of skin: Secondary | ICD-10-CM | POA: Diagnosis not present

## 2022-03-07 DIAGNOSIS — L853 Xerosis cutis: Secondary | ICD-10-CM

## 2022-03-07 DIAGNOSIS — G894 Chronic pain syndrome: Secondary | ICD-10-CM

## 2022-03-07 DIAGNOSIS — M0600 Rheumatoid arthritis without rheumatoid factor, unspecified site: Secondary | ICD-10-CM

## 2022-03-07 DIAGNOSIS — M79642 Pain in left hand: Secondary | ICD-10-CM

## 2022-03-07 DIAGNOSIS — M79641 Pain in right hand: Secondary | ICD-10-CM | POA: Diagnosis not present

## 2022-03-07 DIAGNOSIS — M1120 Other chondrocalcinosis, unspecified site: Secondary | ICD-10-CM

## 2022-03-07 DIAGNOSIS — M797 Fibromyalgia: Secondary | ICD-10-CM | POA: Diagnosis not present

## 2022-03-07 DIAGNOSIS — M19011 Primary osteoarthritis, right shoulder: Secondary | ICD-10-CM

## 2022-03-07 LAB — HM DIABETES EYE EXAM

## 2022-03-07 MED ORDER — TRAMADOL HCL 50 MG PO TABS: 50.0000 mg | ORAL_TABLET | Freq: Every day | ORAL | 2 refills | Status: DC

## 2022-03-07 MED ORDER — HYDROXYCHLOROQUINE SULFATE 200 MG PO TABS: 200.0000 mg | ORAL_TABLET | Freq: Every day | ORAL | 2 refills | Status: DC

## 2022-03-07 MED ORDER — AMMONIUM LACTATE 12 % EX LOTN: 1.0000 | TOPICAL_LOTION | CUTANEOUS | 0 refills | Status: DC | PRN

## 2022-03-07 MED ORDER — GABAPENTIN 300 MG PO CAPS: ORAL_CAPSULE | ORAL | 2 refills | Status: DC

## 2022-03-07 NOTE — Telephone Encounter (Signed)
Prescription Request  03/07/2022  Is this a "Controlled Substance" medicine? No  LOV: Visit date not found  What is the name of the medication or equipment?   traMADol (ULTRAM) 50 MG tablet  **REQUESTING FOR AVAILABLE PROVIDER TO CALL IN SCRIPT TODAY TO PREVENT A LAPSE IN DOSES**  Have you contacted your pharmacy to request a refill? Yes   Which pharmacy would you like this sent to?  [facility] - [place], [place] [address] Phone: [phone] Fax: [phone]    Patient notified that their request is being sent to the clinical staff for review and that they should receive a response within 2 business days.   Please advise patient at [phone].

## 2022-03-07 NOTE — Telephone Encounter (Signed)
Requested medication (s) are due for refill today - no  Requested medication (s) are on the active medication list -yes  Future visit scheduled -no  Last refill: 02/15/22 #30 2RF  Notes to clinic: non delegated Rx- listed as print  Requested Prescriptions  Pending Prescriptions Disp Refills   traMADol (ULTRAM) 50 MG tablet [Pharmacy Med Name: tramadol 50 mg tablet] 30 tablet 2    Sig: TAKE 1 TABLET BY MOUTH AT BEDTIME     Not Delegated - Analgesics:  Opioid Agonists Failed - 03/07/2022  8:48 AM      Failed - This refill cannot be delegated      Failed - Urine Drug Screen completed in last 360 days      Failed - Valid encounter within last 3 months    Recent Outpatient Visits           9 months ago Chronic fatigue   [place] [name], Cammie Mcgee, MD   1 year ago Anemia, unspecified type   [place] [name], Cammie Mcgee, MD   1 year ago Anemia, unspecified type   [place] [name], MD   1 year ago Type 2 diabetes mellitus with other specified complication, unspecified whether long term insulin use ([place])   [place] [name], Cammie Mcgee, MD   1 year ago Chronic combined systolic and diastolic CHF (congestive heart failure) ([place])   [place] [name], Cammie Mcgee, MD       Future Appointments             Today Benjamine Mola, Resa Miner, MD [facility] [place] [place]   In 1 week Dewald, Cheryle Horsfall, MD [facility]   In 1 month Buford Dresser, MD [facility]               Requested Prescriptions  Pending Prescriptions Disp Refills   traMADol (ULTRAM) 50 MG tablet [Pharmacy Med Name: tramadol 50 mg tablet] 30 tablet 2    Sig: TAKE 1 TABLET BY MOUTH AT BEDTIME     Not Delegated - Analgesics:  Opioid Agonists Failed - 03/07/2022  8:48 AM      Failed - This refill cannot be delegated      Failed - Urine  Drug Screen completed in last 360 days      Failed - Valid encounter within last 3 months    Recent Outpatient Visits           9 months ago Chronic fatigue   [place] Dennard Schaumann, Cammie Mcgee, MD   1 year ago Anemia, unspecified type   [place] [name], Cammie Mcgee, MD   1 year ago Anemia, unspecified type   [place] [name], MD   1 year ago Type 2 diabetes mellitus with other specified complication, unspecified whether long term insulin use ([place])   [place] [name], MD   1 year ago Chronic combined systolic and diastolic CHF (congestive heart failure) ([place])   [place] [name], Cammie Mcgee, MD       Future Appointments             Today Benjamine Mola, Resa Miner, MD [facility] . [place]   In 1 week Dewald, Cheryle Horsfall, MD [place] [facility]   In 1 month Harrell Gave,  Shawna Orleans, MD [facility]

## 2022-03-07 NOTE — Progress Notes (Unsigned)
Subjective:  Patient ID: Ebony Scott, female    DOB: September 12, 1950,  MRN: [id number]  Chief Complaint  Patient presents with   Callouses    72 y.o. female presents with the above complaint.  Patient presents with bilateral midfoot porokeratotic lesion.  Painful to touch is progressive gotten worse.  She states hurts with ambulation she would like to have that done.  I does give her some relief in the past.  She denies any other acute complaints.  Overall doing much better   Review of Systems: Negative except as noted in the HPI. Denies N/V/F/Ch.  Past Medical History:  Diagnosis Date   Abscess of dorsum of right hand 09/22/2020   Acute deep vein thrombosis (DVT) of right lower extremity ([place]) 12/13/2017   Allergic rhinitis    Anemia    Angio-edema    Anxiety    pt denies dx   Arthritis    Phreesia 10/21/2019   Asthma    Phreesia 10/21/2019   Breast cancer ([place]) 1998   Left breast, in remission   Bronchiectasis ([place])    Cataract    REMOVED   CHF (congestive heart failure) ([place])    Clostridium difficile colitis 31/54/0086   Complication of anesthesia    "had hard time waking up from it several times" (02/20/2012), no anesthesia problems since 2014   Coronary artery disease    Depression    "some; don't take anything for it" (02/20/2012), pt denies dx as of 06/02/21   Diverticulosis    DVT (deep venous thrombosis) ([place])    Dyspnea    all the time   Fibromyalgia 11/2011   GAVE (gastric antral vascular ectasia)    GERD (gastroesophageal reflux disease)    Graves disease    Headache(784.0)    "related to allergies; more at different times during the year" (02/20/2012)   Hemorrhoids    Hiatal hernia    back and neck   History of blood transfusion 2023   1 unit, 5 units of Iron   Hx of adenomatous colonic polyps 04/12/2016   Hypercholesteremia    good cholesterol is high   Hypothyroidism    IBS (irritable bowel syndrome)    Moderate persistent asthma    -FeV1 72% 2011, -IgE  102 2011, CT sinus Neg 2011   Osteomyelitis of second toe of right foot ([place]) 09/23/2020   Osteoporosis    on reclast yearly   Peripheral vascular disease ([place]) 2019   DVTs (3) lungs and leg   Pneumonia 04/2011; ~ 11/2011   "double; single" (02/20/2012)   Septic olecranon bursitis of right elbow 09/22/2020   Seronegative rheumatoid arthritis ([place])    Dr. Lahoma Rocker   SIRS (systemic inflammatory response syndrome) ([place]) 02/10/2018   Sleep apnea    uses cpap nightly   Tracheobronchomalacia     Current Outpatient Medications:    ammonium lactate (AMLACTIN DAILY) 12 % lotion, Apply 1 Application topically as needed for dry skin., Disp: 400 g, Rfl: 0   acetaminophen (TYLENOL) 650 MG CR tablet, Take 1,300 mg by mouth every 8 (eight) hours as needed for pain., Disp: , Rfl:    albuterol (VENTOLIN HFA) 108 (90 Base) MCG/ACT inhaler, Inhale 2 puffs into the lungs every 6 (six) hours as needed for wheezing or shortness of breath., Disp: 19 g, Rfl: 1   allopurinol (ZYLOPRIM) 100 MG tablet, TAKE 1 TABLET BY MOUTH DAILY, Disp: 90 tablet, Rfl: 2   Alpha-D-Galactosidase (BEANO PO), Take 2 tablets by mouth 3 (  three) times daily with meals., Disp: , Rfl:    Alpha-Lipoic Acid 600 MG TABS, Take 600 mg by mouth daily., Disp: , Rfl:    atorvastatin (LIPITOR) 80 MG tablet, TAKE 1 TABLET BY MOUTH DAILY, Disp: 90 tablet, Rfl: 2   azelastine (ASTELIN) 0.1 % nasal spray, Place 2 sprays into both nostrils daily. Use in each nostril as directed, Disp: 30 mL, Rfl: 5   benzonatate (TESSALON) 200 MG capsule, TAKE 1 CAPSULE BY MOUTH THREE TIMES DAILY AS NEEDED FOR COUGH, Disp: 30 capsule, Rfl: 0   budesonide (PULMICORT) 1 MG/2ML nebulizer solution, Take 2 mLs (1 mg total) by nebulization daily., Disp: 60 mL, Rfl: 0   Calcium-Magnesium-Vitamin D (CALCIUM MAGNESIUM PO), Take 1 tablet by mouth daily., Disp: , Rfl:    cetirizine (ZYRTEC) 10 MG tablet, Take 1 tablet (10 mg total) by mouth 2 (two) times daily as needed for  allergies (Can take a n extra dose during flare ups.). (Patient taking differently: Take 10 mg by mouth every evening.), Disp: 60 tablet, Rfl: 5   cholecalciferol (VITAMIN D) 25 MCG (1000 UNIT) tablet, Take 1,000 Units by mouth every evening., Disp: , Rfl:    clopidogrel (PLAVIX) 75 MG tablet, Take 1 tablet (75 mg total) by mouth daily., Disp: 90 tablet, Rfl: 3   cyproheptadine (PERIACTIN) 4 MG tablet, Take 2 tablets (8 mg total) by mouth at bedtime. Take 2 tablets at bedtime as needed to treat facial pain or sleep dysfunction. (Patient taking differently: Take 8 mg by mouth at bedtime.), Disp: 60 tablet, Rfl: 5   denosumab (PROLIA) 60 MG/ML SOSY injection, Inject 60 mg into the skin every 6 (six) months., Disp: , Rfl:    dexlansoprazole (DEXILANT) 60 MG capsule, Take 1 capsule (60 mg total) by mouth daily., Disp: 90 capsule, Rfl: 3   diclofenac Sodium (VOLTAREN) 1 % GEL, Apply 1 Application topically 4 (four) times daily as needed (pain)., Disp: 100 g, Rfl: 3   empagliflozin (JARDIANCE) 10 MG TABS tablet, Take 1 tablet (10 mg total) by mouth daily before breakfast., Disp: 30 tablet, Rfl: 5   EPINEPHrine 0.3 mg/0.3 mL IJ SOAJ injection, USE AS DIRECTED BY YOUR PHYSICIAN INTRAMUSCULARLY AS NEEDED FOR ANAPHYLAXIS, Disp: 2 each, Rfl: 1   famotidine (PEPCID) 20 MG tablet, Take 1 tablet (20 mg total) by mouth at bedtime., Disp: 30 tablet, Rfl: 5   fluconazole (DIFLUCAN) 100 MG tablet, Take 1 tablet (100 mg total) by mouth daily., Disp: 3 tablet, Rfl: 0   fluticasone (FLONASE) 50 MCG/ACT nasal spray, USE 2 SPRAYS IN EACH NOSTRIL DAILY (Patient taking differently: as needed.), Disp: 16 g, Rfl: 2   folic acid (FOLVITE) 1 MG tablet, Take 1 tablet (1 mg total) by mouth daily., Disp: 90 tablet, Rfl: 3   furosemide (LASIX) 40 MG tablet, Take 1 tablet (40 mg total) by mouth daily., Disp: 90 tablet, Rfl: 2   gabapentin (NEURONTIN) 300 MG capsule, TAKE 1 CAPSULE BY MOUTH 3 TIMES DAILY AS NEEDED, Disp: 90 capsule,  Rfl: 2   guaiFENesin (MUCINEX) 600 MG 12 hr tablet, Take 1 tablet (600 mg total) by mouth 2 (two) times daily as needed for cough or to loosen phlegm. (Patient taking differently: Take 600 mg by mouth 2 (two) times daily.), Disp: 60 tablet, Rfl: 1   HIZENTRA 1 GM/5ML SOLN, Hizentra 3 times/5 ml = 65 ml total once weekly on Fridays, Disp: , Rfl:    hydroxychloroquine (PLAQUENIL) 200 MG tablet, Take 1 tablet (200 mg total)  by mouth daily., Disp: 30 tablet, Rfl: 2   ipratropium-albuterol (DUONEB) 0.5-2.5 (3) MG/3ML SOLN, Take 3 mLs by nebulization every 6 (six) hours as needed., Disp: 360 mL, Rfl: 6   isosorbide-hydrALAZINE (BIDIL) 20-37.5 MG tablet, TAKE 1 TABLET BY MOUTH TWICE DAILY, Disp: 60 tablet, Rfl: 1   Lactase (LACTOSE FAST ACTING RELIEF PO), Take 3 tablets by mouth 3 (three) times daily., Disp: , Rfl:    lidocaine-prilocaine (EMLA) cream, Apply 1 application  topically as needed (pain)., Disp: , Rfl:    magnesium oxide (MAG-OX) 400 (240 Mg) MG tablet, Take 400 mg by mouth daily., Disp: , Rfl:    Menthol, Topical Analgesic, (BIOFREEZE) 4 % GEL, Apply 1 application. topically daily as needed (pain)., Disp: , Rfl:    mirtazapine (REMERON SOL-TAB) 30 MG disintegrating tablet, Take 1 tablet (30 mg total) by mouth at bedtime as needed (sleep)., Disp: 90 tablet, Rfl: 3   montelukast (SINGULAIR) 10 MG tablet, Take 1 tablet (10 mg total) by mouth daily., Disp: 30 tablet, Rfl: 5   Nystatin (GERHARDT'S BUTT CREAM) CREA, Apply topically 3 times a day as needed for itching or irritation, Disp: 4 each, Rfl: 6   nystatin (MYCOSTATIN) 100000 UNIT/ML suspension, Use as directed 15 mLs in the mouth or throat at bedtime as needed (mouth sores)., Disp: , Rfl:    nystatin-triamcinolone ointment (MYCOLOG), Apply 1 Application topically 2 (two) times daily as needed (vaginal irritation)., Disp: , Rfl:    potassium chloride (KLOR-CON M) 10 MEQ tablet, Take 1 tablet (10 mEq total) by mouth daily., Disp: 90 tablet, Rfl:  1   predniSONE (DELTASONE) 1 MG tablet, Take 2 tablets (2 mg total) by mouth daily. Total of 7 mg daily., Disp: 180 tablet, Rfl: 0   predniSONE (DELTASONE) 5 MG tablet, Take 1 tablet (5 mg total) by mouth daily with breakfast. Along with 2 mg tablets for total daily dose 7 mg total daily, Disp: 90 tablet, Rfl: 0   Prenatal Vit-Fe Fumarate-FA (PRENATAL VITAMINS PO), Take 1 tablet by mouth daily., Disp: , Rfl:    Probiotic Product (ALIGN PO), Take 1 capsule by mouth daily., Disp: , Rfl:    Propylene Glycol (SYSTANE COMPLETE) 0.6 % SOLN, Place 1 drop into both eyes 2 (two) times daily., Disp: 15 mL, Rfl: 5   rifaximin (XIFAXAN) 550 MG TABS tablet, Take 1 tablet (550 mg total) by mouth 3 (three) times daily., Disp: 42 tablet, Rfl: 0   Tiotropium Bromide-Olodaterol (STIOLTO RESPIMAT) 2.5-2.5 MCG/ACT AERS, Inhale 2 puffs into the lungs daily. (Patient taking differently: Inhale 2 puffs into the lungs every evening.), Disp: 4 g, Rfl: 11   traMADol (ULTRAM) 50 MG tablet, Take 1 tablet (50 mg total) by mouth at bedtime., Disp: 30 tablet, Rfl: 2   valsartan (DIOVAN) 40 MG tablet, Take 1 tablet (40 mg total) by mouth daily., Disp: 90 tablet, Rfl: 3   XARELTO 20 MG TABS tablet, TAKE 1 TABLET BY MOUTH DAILY WITH SUPPER, Disp: 90 tablet, Rfl: 2 No current facility-administered medications for this visit.  Facility-Administered Medications Ordered in Other Visits:    iohexol (OMNIPAQUE) 350 MG/ML injection, , , PRN, Leonie Man, MD, 105 mL at 01/11/21 0960  Social History   Tobacco Use  Smoking Status Never   Passive exposure: Past  Smokeless Tobacco Never  Tobacco Comments   Parents    Allergies  Allergen Reactions   Baclofen Other (See Comments)    Altered mental status requiring a 3-day hospital stay- patient became  unresponsive   Dust Mite Extract Shortness Of Breath and Other (See Comments)    "sneezing" (02/20/2012)   Molds & Smuts Shortness Of Breath   Morphine And Related Hives and  Itching   Other Shortness Of Breath and Other (See Comments)    Grass and weeds "sneezing; filled sinuses" (02/20/2012)   Penicillins Rash and Other (See Comments)    "welts" (02/20/2012) Tolerated Ancef 02/16/20.    Rofecoxib Swelling and Other (See Comments)    Vioxx- feet swelling    Shellfish Allergy Anaphylaxis, Shortness Of Breath, Itching and Rash   Tetracycline Hcl Nausea And Vomiting   Xolair [Omalizumab] Other (See Comments)    Caused Blood clot   Zoledronic Acid Other (See Comments)    Fever, Put in hospital, dr said it was a reaction from a reaction  Other reaction(s): Unknown   Dilaudid [Hydromorphone Hcl] Itching   Levofloxacin Other (See Comments)    GI upset   Oxycodone Hcl Nausea And Vomiting   Paroxetine Nausea And Vomiting and Other (See Comments)    Paxil    Celecoxib Swelling and Other (See Comments)    Feet swelling   Diltiazem Hcl Swelling   Hydrocodone-Acetaminophen Itching   Lactose Intolerance (Gi) Other (See Comments)    Bloating and gas   Rituximab     Anemia    Tree Extract Other (See Comments)    "tested and told I was allergic to it; never experienced a reaction to it" (02/20/2012)   Gamunex [Immune Globulin] Itching and Rash    Tolerates hizentra    Penicillin G Procaine Rash and Other (See Comments)    Welts, also   Objective:   Vitals:   03/07/22 1547  BP: 138/80   There is no height or weight on file to calculate BMI. Constitutional Well developed. Well nourished.  Vascular Dorsalis pedis pulses palpable bilaterally. Posterior tibial pulses palpable bilaterally. Capillary refill normal to all digits.  No cyanosis or clubbing noted. Pedal hair growth normal.  Neurologic Normal speech. Oriented to person, place, and time. Epicritic sensation to light touch grossly present bilaterally.  Dermatologic Hyperkeratotic lesion with central nucleated core noted to bilateral midfoot.  Pain on palpation to the lesion.  Bilateral dry skin noted  to both feet.  No open wounds or fissures noted  Orthopedic: Normal joint ROM without pain or crepitus bilaterally. No visible deformities. No bony tenderness.   Radiographs: None Assessment:   1. Porokeratosis   2. Xerosis of skin    Plan:  Patient was evaluated and treated and all questions answered.  Right midfoot porokeratotic lesion -All questions and concerns were discussed with the patient in extensive detail using chisel blade handle the lesion was debrided down to healthy striated tissue.  No complication noted no pinpoint bleeding noted.  Bilateral xerosis -I explained to the patient the etiology of xerosis and various treatment options were extensively discussed.  I explained to the patient the importance of maintaining moisturization of the skin with application of over-the-counter lotion such as Eucerin or Luciderm.  Patient will benefit from ammonium lactate ammonium lactate was sent to the pharmacy.   No follow-ups on file.  Right midfoot exostosis debridement  Bilateral xerosis ammonium lactate

## 2022-03-08 ENCOUNTER — Other Ambulatory Visit: Payer: Self-pay | Admitting: Family Medicine

## 2022-03-08 DIAGNOSIS — M19011 Primary osteoarthritis, right shoulder: Secondary | ICD-10-CM

## 2022-03-08 DIAGNOSIS — M1120 Other chondrocalcinosis, unspecified site: Secondary | ICD-10-CM

## 2022-03-08 LAB — CBC WITH DIFFERENTIAL/PLATELET
Absolute Monocytes: 992 cells/uL — ABNORMAL HIGH (ref 200–950)
Basophils Absolute: 55 cells/uL (ref 0–200)
Basophils Relative: 0.5 %
Eosinophils Absolute: 120 cells/uL (ref 15–500)
Eosinophils Relative: 1.1 %
HCT: 35.4 % (ref 35.0–45.0)
Hemoglobin: 11.9 g/dL (ref 11.7–15.5)
Lymphs Abs: 1264 cells/uL (ref 850–3900)
MCH: 35.3 pg — ABNORMAL HIGH (ref 27.0–33.0)
MCHC: 33.6 g/dL (ref 32.0–36.0)
MCV: 105 fL — ABNORMAL HIGH (ref 80.0–100.0)
MPV: 11.6 fL (ref 7.5–12.5)
Monocytes Relative: 9.1 %
Neutro Abs: 8469 cells/uL — ABNORMAL HIGH (ref 1500–7800)
Neutrophils Relative %: 77.7 %
Platelets: 259 10*3/uL (ref 140–400)
RBC: 3.37 10*6/uL — ABNORMAL LOW (ref 3.80–5.10)
RDW: 14.8 % (ref 11.0–15.0)
Total Lymphocyte: 11.6 %
WBC: 10.9 10*3/uL — ABNORMAL HIGH (ref 3.8–10.8)

## 2022-03-08 LAB — BASIC METABOLIC PANEL WITH GFR
BUN/Creatinine Ratio: 19 (calc) (ref 6–22)
BUN: 32 mg/dL — ABNORMAL HIGH (ref 7–25)
CO2: 27 mmol/L (ref 20–32)
Calcium: 9.9 mg/dL (ref 8.6–10.4)
Chloride: 103 mmol/L (ref 98–110)
Creat: 1.68 mg/dL — ABNORMAL HIGH (ref 0.60–1.00)
Glucose, Bld: 133 mg/dL — ABNORMAL HIGH (ref 65–99)
Potassium: 4.8 mmol/L (ref 3.5–5.3)
Sodium: 143 mmol/L (ref 135–146)
eGFR: 32 mL/min/{1.73_m2} — ABNORMAL LOW (ref >=60)

## 2022-03-08 LAB — C-REACTIVE PROTEIN: CRP: 4.4 mg/L (ref <8.0)

## 2022-03-08 LAB — SEDIMENTATION RATE: Sed Rate: 89 mm/h — ABNORMAL HIGH (ref 0–30)

## 2022-03-08 MED ORDER — TRAMADOL HCL 50 MG PO TABS: 50.0000 mg | ORAL_TABLET | Freq: Every day | ORAL | 2 refills | Status: DC

## 2022-03-08 NOTE — Progress Notes (Signed)
Sedimentation rate is increased at 89 up from 43 before this is consistent with an increase in joint inflammation. Hopefully we see additional improvement adding the hydroxychloroquine along with her prednisone. Xrays do not show any definite new erosion in her finger but a lot of existing damage. There are calcifications in the shoulder areas consistent with chronic bursitis or tendonitis. If this is not improving with the medication change I would recommend trying an injection to this area or having her see a physical therapist.

## 2022-03-12 ENCOUNTER — Encounter: Payer: Self-pay | Admitting: Family Medicine

## 2022-03-14 IMAGING — CT CT CHEST HIGH RESOLUTION W/O CM
2 of 7 series · 14 of 36 positions shown, 17 images · non-contrast
Comparison: 10/24/2018 high-resolution chest CT.

CLINICAL DATA: Dyspnea on exertion, worsening. Cough. Rheumatoid
arthritis on TNF alpha medication. Recent pulmonary infection
treated with 2 rounds of antibiotic therapy and prednisone.

EXAM:
CT CHEST WITHOUT CONTRAST
TECHNIQUE: Multidetector CT imaging of the chest was performed following the
standard protocol without intravenous contrast. High resolution
imaging of the lungs, as well as inspiratory and expiratory imaging,
was performed.

[Series 4: high resolution · axial · 0.62mm/px · z∈[-316,-72]mm · 11 of 295 slices shown, 14 images]
[im 25/295  mediastinal]
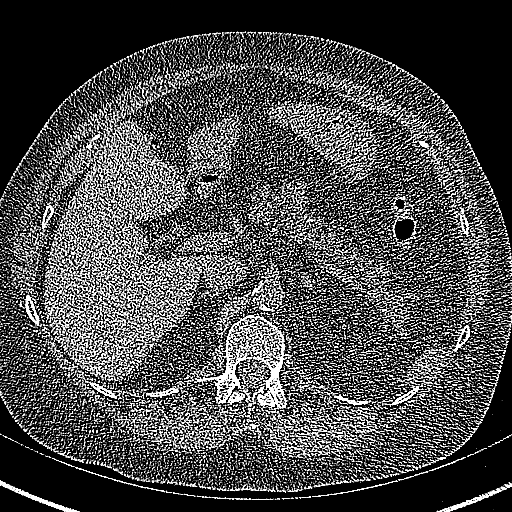
[im 25/295  lung]
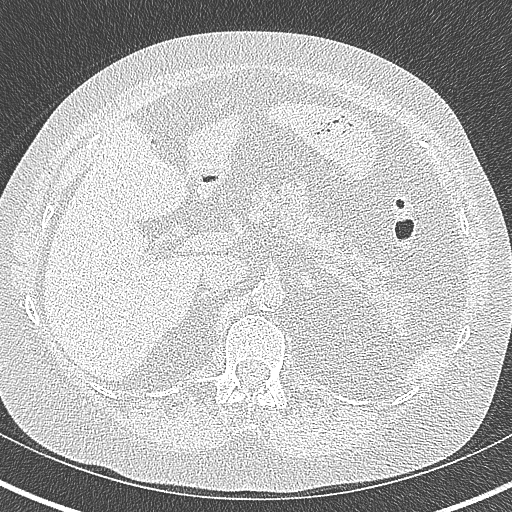
[im 50/295  lung]
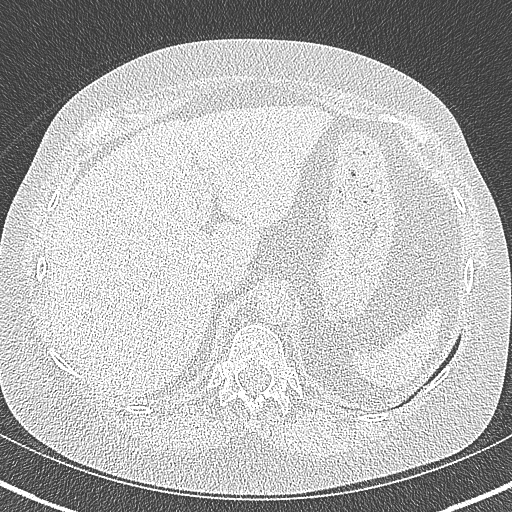
[im 74/295  lung]
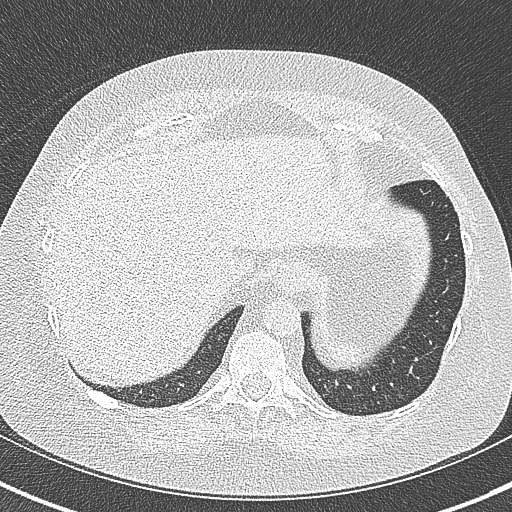
[im 99/295  lung]
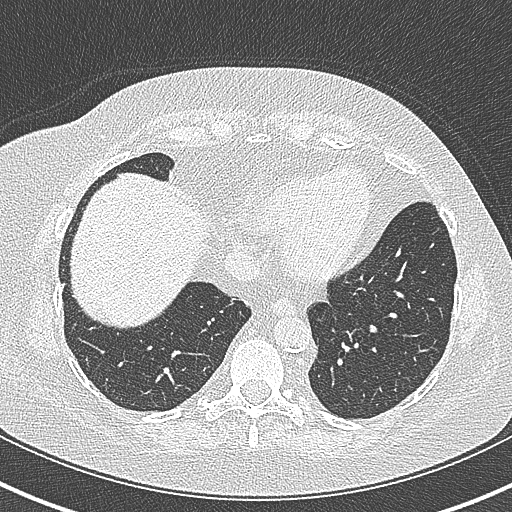
[im 123/295  mediastinal]
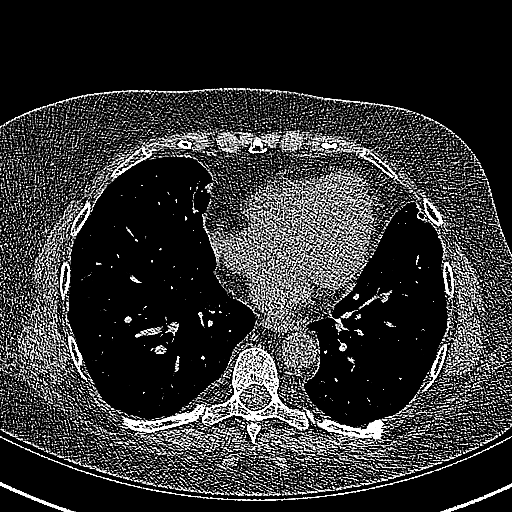
[im 123/295  lung]
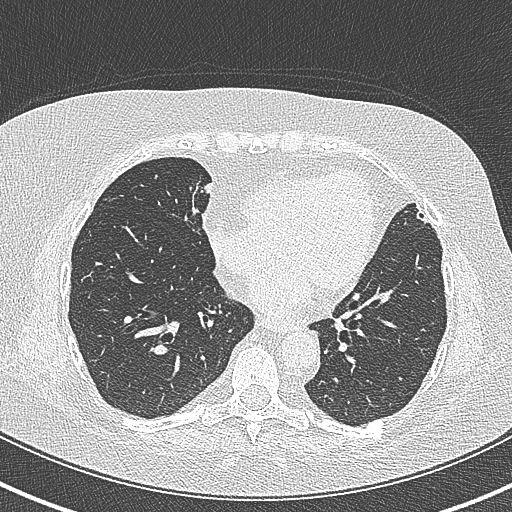
[im 148/295  lung]
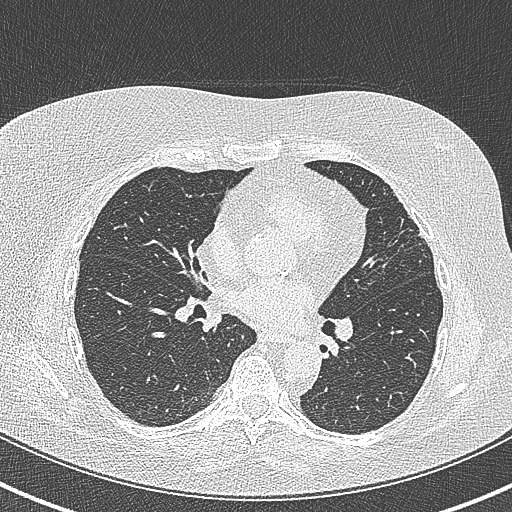
[im 172/295  lung]
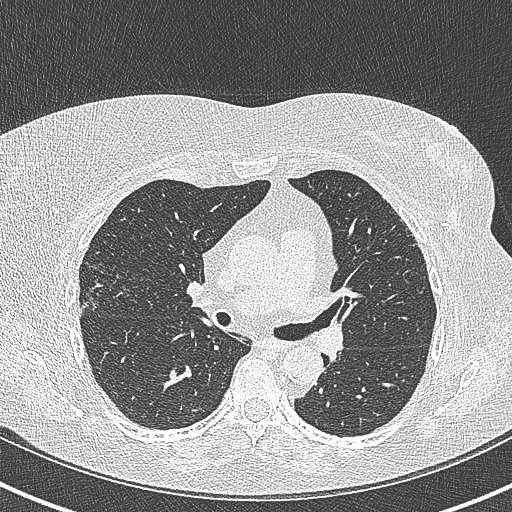
[im 197/295  lung]
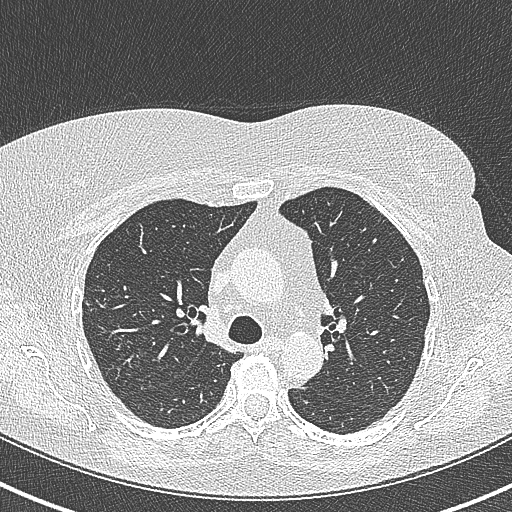
[im 221/295  mediastinal]
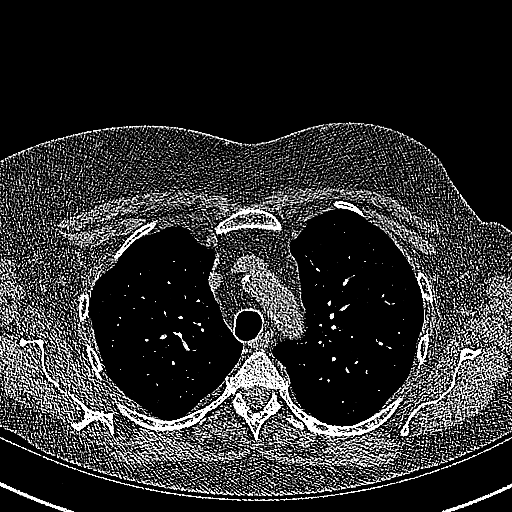
[im 221/295  lung]
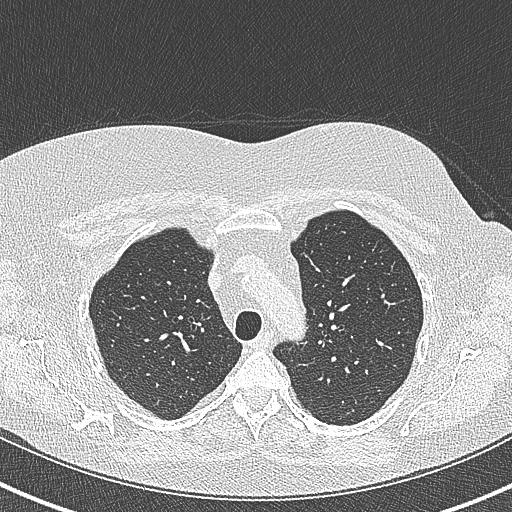
[im 246/295  lung]
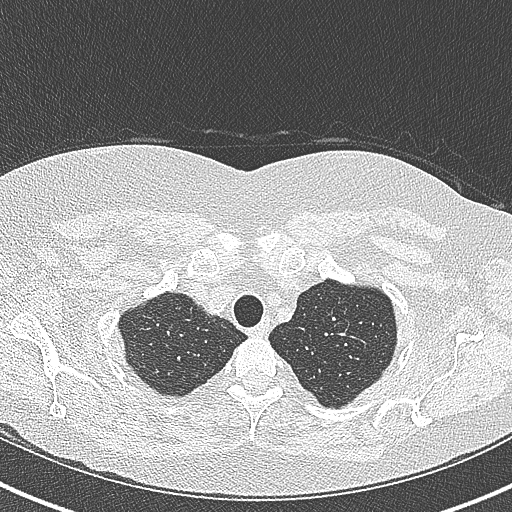
[im 270/295  lung]
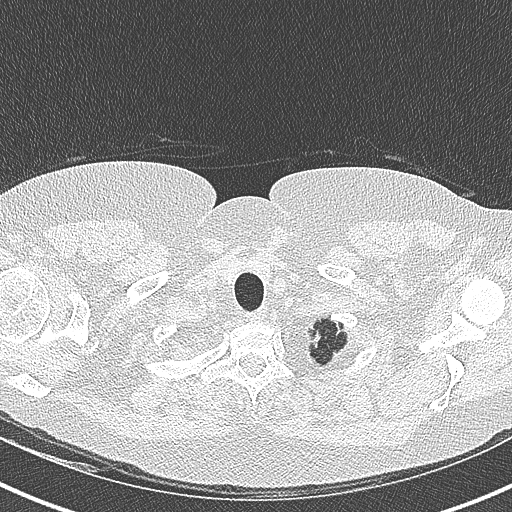

[Series 6: coronal · coronal · 0.59mm/px · 3 of 110 slices shown]
[im 22/110  lung]
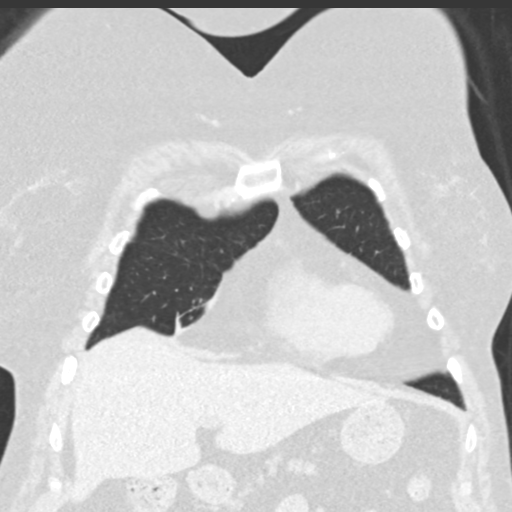
[im 44/110  lung]
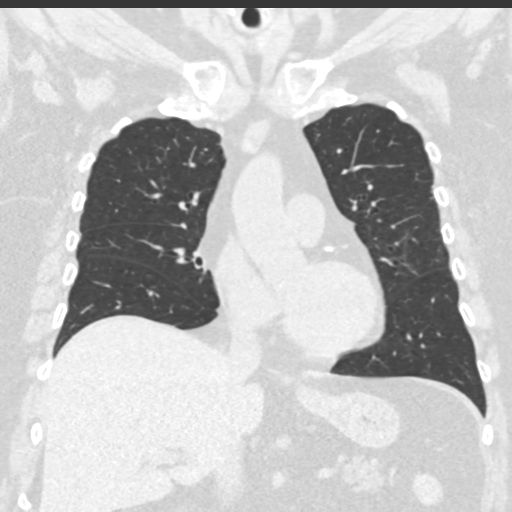
[im 66/110  lung]
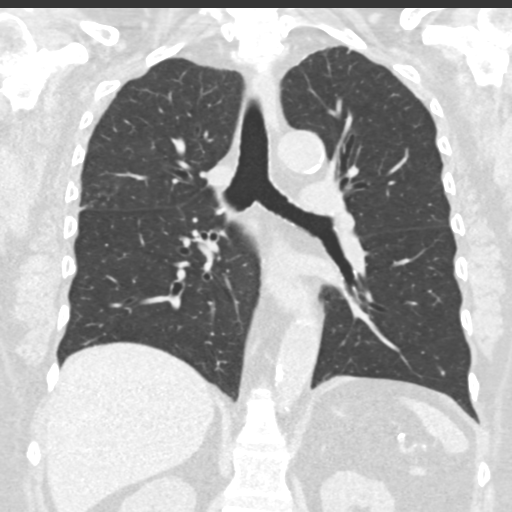

[14 of 36 positions shown; findings below may reference images not displayed]

FINDINGS: Cardiovascular: Normal heart size. No significant pericardial
effusion/thickening. Left anterior descending and left circumflex
coronary atherosclerosis. Atherosclerotic nonaneurysmal thoracic
aorta. Normal caliber pulmonary arteries.

Mediastinum/Nodes: No discrete thyroid nodules. Unremarkable
esophagus. No pathologically enlarged axillary, mediastinal or hilar
lymph nodes, noting limited sensitivity for the detection of hilar
adenopathy on this noncontrast study.

Lungs/Pleura: No pneumothorax. No pleural effusion. No acute
consolidative airspace disease, lung masses or significant pulmonary
nodules. Scattered regions of mild cylindrical bronchiectasis in the
mid lungs, most prominent in the medial segment right middle lobe
and lingula with associated subpleural parenchymal banding,
unchanged. New mild patchy tree-in-bud opacities in the peripheral
right upper lobe. No significant regions of subpleural reticulation,
ground-glass attenuation or frank honeycombing. No significant air
trapping. Redemonstrated tracheobronchomalacia on the expiration
sequence, more pronounced on the prior scan.

Upper abdomen: Cholecystectomy.

Musculoskeletal: Nearly healed deformities in multiple posterior
ribs bilaterally with associated sclerosis, new.
IMPRESSION: 1. Tracheobronchomalacia, more pronounced on the prior scan.
2. New mild patchy tree-in-bud opacities in the peripheral right
upper lobe, compatible with nonspecific mild bronchiolitis.
3. Stable chronic mild cylindrical bronchiectasis in the mid lungs
with associated subpleural scarring. No evidence of interstitial
lung disease.
4. Nearly healed deformities in multiple posterior ribs bilaterally
with associated sclerosis, new. Correlate for any history of
interval trauma.
5. Two-vessel coronary atherosclerosis.
6. Aortic Atherosclerosis ([id number]).

## 2022-03-19 ENCOUNTER — Ambulatory Visit: Payer: Medicare PPO | Admitting: Pulmonary Disease

## 2022-03-19 ENCOUNTER — Encounter: Payer: Self-pay | Admitting: Pulmonary Disease

## 2022-03-19 VITALS — BP 120/60 | HR 72 | Ht 61.0 in | Wt 137.0 lb

## 2022-03-19 DIAGNOSIS — J4551 Severe persistent asthma with (acute) exacerbation: Secondary | ICD-10-CM

## 2022-03-19 DIAGNOSIS — R053 Chronic cough: Secondary | ICD-10-CM | POA: Diagnosis not present

## 2022-03-19 DIAGNOSIS — B379 Candidiasis, unspecified: Secondary | ICD-10-CM

## 2022-03-19 DIAGNOSIS — M06049 Rheumatoid arthritis without rheumatoid factor, unspecified hand: Secondary | ICD-10-CM | POA: Diagnosis not present

## 2022-03-19 DIAGNOSIS — J479 Bronchiectasis, uncomplicated: Secondary | ICD-10-CM | POA: Diagnosis not present

## 2022-03-19 MED ORDER — DOXYCYCLINE HYCLATE 100 MG PO TABS: 100.0000 mg | ORAL_TABLET | Freq: Two times a day (BID) | ORAL | 0 refills | Status: DC

## 2022-03-19 MED ORDER — FLUCONAZOLE 100 MG PO TABS: 100.0000 mg | ORAL_TABLET | Freq: Every day | ORAL | 0 refills | Status: DC

## 2022-03-19 NOTE — Progress Notes (Unsigned)
Synopsis: Referred in 2011 for asthma by [name], MD.  Previously a patient of Drs. Sunnie Nielsen, Mc[place]aid and [place].  Subjective:   PATIENT ID: Ebony Scott GENDER: female DOB: 11-Nov-1950, MRN: [id number]  HPI  Chief Complaint  Patient presents with   Follow-up    F/U on bronchiectasis. Still using her vest daily. Still coughing up thick yellowish, greyish phlegm. Believes her SOB has gotten worse over the past few weeks.    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   She was treated with azithromycin and prednisone 11/24/21 for bronchitis.   She has increased cough and mucous production over recent weeks. No fevers or chills. She has left sided nose/sinus drainage with pressure under the left eye.   OV 11/15/21 She reports increased cough and wheezing over recent weeks. She was placed on extended steroid taper with some improvement but she reports it has progressed since completing the taper. She denies fevers chills or change in mucous production. She continues to produce yellow/clear sputum.   She received her RSV and influenza vaccines.  OV 03/15/21 She has been doing okay since last visit.  She reports persistent wheezing and cough at night.  She is coughing up thick yellow sputum.  She is currently taking 12.5 mg of prednisone daily for her rheumatoid arthritis and is being tapered down by Dr. Tobie Poet of rheumatology.  She is receiving IVIG infusions via Dr. Neldon Mc.  She reports she is having left ear and sinus congestion with some postnasal drainage.  He has been doing okay on Stiolto inhaler.  OV 11/15/20 She reports increased wheezing, cough and dyspnea since last visit. She is having a lot of wheezing at night. She remains on stiolto respimat daily and is using albuterol  nebulizer treatments twice daily with flutter valve for mucous clearance. She complains of cough when eating/drinking.  She is being started on IVIG infusions by Dr. 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 She remains on prednisone 61m daily for her rheumatoid arthritis.  We discussed her CT chest results from 08/23/20 which showed resolution of her pulmonary nodules with residual areas of scarring.  OV 06/14/20 She has completed two courses of diflucan per ENT for laryngeal candidiasis and her voice is much improved. We discontinued her inhaled steroid during this period of time as well. She is noticing more shortness of breath during the day.   She was recently transitioned to rituximab by her rheumatoligist and has been taken off liflunamide. She continues on 13mof prednisone daily.   CT Chest on 4/6 when compared to the CT Chest on 2/4 shows a decrease in the size of the nodular/consolidative opacities of the medial right lung base and left lower lobe. No new nodules or lesions noted.   OV 04/29/20 She underwent bronchoscopy on 04/05/20 and cultures remain unrevealing at this time. The BAL samples have grown candida albicans and we discussed that we typically do not treat this finding as it does not usually cause pathogenic disease.   She continues to experience hoarse voice and cough along with shortness of breath. She denies weight loss, night sweats, loss of appetite.   OV 04/01/20: CT scan 03/25/2020 showed multifocal areas of nodularity and consolidative changes. Findings consistent with atypical infection or possible malignancy. She was started on azithromycin and cefpodoxime  by Dr. Dennard Schaumann. She has completed the azithromycin and she started the cefpodoxime on 2/7. She was treated with levaquin on 03/21/20 for 7 days initially until the CT scan resulted. Blood cultures were negative on 02/23/20 when she presented with AMS.   She reports she is coughing up brown sputum. At  baseline she doe snot cough up sputum or it is clear. The sputum amount has declined she taking antibiotics. She denies fevers, chills or sweats. Her appetite is well.    OV 09/01/19: Ms. Bleich is a 72 y/o woman who presents for follow up of bronchiectasis, tracheobronchomalacia, immunosuppression, chronic AC for recurrent VTE. She started prescribed levofloxacin last evening for Pseudomonas bronchiectasis AE. So far she has not had nausea associated with taking this med, but took zofran prophylacticaly. She remains SOB with no change so far in her symptoms. She still coughs frequently despite using her inhaler and hypertonic saline nebs + flutter as prescribed. She has follow up with her PCP soon for hyperglycemiaa. She has follow up with her rheumatologist in August and is hoping that her prednisone will be able to be decreased soon.  Past Medical History:  Diagnosis Date   Abscess of dorsum of right hand 09/22/2020   Acute deep vein thrombosis (DVT) of right lower extremity ([place]) 12/13/2017   Allergic rhinitis    Anemia    Angio-edema    Anxiety    pt denies dx   Arthritis    Phreesia 10/21/2019   Asthma    Phreesia 10/21/2019   Breast cancer ([place]) 1998   Left breast, in remission   Bronchiectasis ([place])    Cataract    REMOVED   CHF (congestive heart failure) ([place])    Clostridium difficile colitis 37/62/8315   Complication of anesthesia    "had hard time waking up from it several times" (02/20/2012), no anesthesia problems since 2014   Coronary artery disease    Depression    "some; don't take anything for it" (02/20/2012), pt denies dx as of 06/02/21   Diverticulosis    DVT (deep venous thrombosis) ([place])    Dyspnea    all the time   Fibromyalgia 11/2011   GAVE (gastric antral vascular ectasia)    GERD (gastroesophageal reflux disease)    Graves disease    Headache(784.0)    "related to allergies; more at different times during the year" (02/20/2012)   Hemorrhoids    Hiatal hernia     back and neck   History of blood transfusion 2023   1 unit, 5 units of Iron   Hx of adenomatous colonic polyps 04/12/2016   Hypercholesteremia    good cholesterol is high   Hypothyroidism    IBS (irritable bowel syndrome)    Moderate persistent asthma    -FeV1 72% 2011, -IgE 102 2011, CT sinus Neg 2011   Osteomyelitis of second toe of right foot ([place]) 09/23/2020   Osteoporosis    on reclast yearly   Peripheral vascular disease ([place]) 2019   DVTs (3) lungs and leg   Pneumonia 04/2011; ~ 11/2011   "double; single" (02/20/2012)   Septic olecranon bursitis of right elbow 09/22/2020   Seronegative rheumatoid arthritis ([place])    Dr. Lahoma Rocker   SIRS (systemic inflammatory response syndrome) ([place]) 02/10/2018   Sleep apnea    uses cpap nightly   Tracheobronchomalacia      Family History  Problem Relation Age of Onset   Allergies Mother    Heart disease Mother  Arthritis Mother    Lung cancer Mother        heavy smoker   Diabetes Mother    Allergies Father    Heart disease Father    Arthritis Father    Stroke Father    Heart disease Brother    Diabetes Maternal Grandfather    Colitis Daughter    Colon cancer Other        Maternal half uncle     Social History   Socioeconomic History   Marital status: Divorced    Spouse name: Not on file   Number of children: 2   Years of education: college   Highest education level: Not on file  Occupational History   Occupation: Disabled    Comment: Retired Engineer, production: RETIRED  Tobacco Use   Smoking status: Never    Passive exposure: Past   Smokeless tobacco: Never   Tobacco comments:    Parents  Vaping Use   Vaping Use: Never used  Substance and Sexual Activity   Alcohol use: Not Currently    Alcohol/week: 0.0 standard drinks of alcohol   Drug use: No   Sexual activity: Not Currently    Birth control/protection: Surgical    Comment: Hysterectomy  Other Topics Concern   Not on file   Social History Narrative   Patient lives at home alone. Patient  divorced.    Patient has her BS degree.   Right handed.   Caffeine- sometimes coffee.      [facility]:   Born in [place], [place]. She worked as a Copywriter, advertising. She has no pets currently. She does have indoor plants. Previously had mold in her home that was remediated. Carpet was removed.          Social Determinants of Health   Financial Resource Strain: Low Risk  (07/07/2020)   Overall Financial Resource Strain (CARDIA)    Difficulty of Paying Living Expenses: Not hard at all  Food Insecurity: No Food Insecurity (07/13/2021)   Hunger Vital Sign    Worried About Running Out of Food in the Last Year: Never true    Ran Out of Food in the Last Year: Never true  Transportation Needs: No Transportation Needs (07/13/2021)   PRAPARE - Hydrologist (Medical): No    Lack of Transportation (Non-Medical): No  Physical Activity: Insufficiently Active (07/13/2021)   Exercise Vital Sign    Days of Exercise per Week: 5 days    Minutes of Exercise per Session: 20 min  Stress: No Stress Concern Present (07/13/2021)   [place]    Feeling of Stress : Only a little  Social Connections: Moderately Isolated (07/13/2021)   Social Connection and Isolation Panel [NHANES]    Frequency of Communication with Friends and Family: More than three times a week    Frequency of Social Gatherings with Friends and Family: More than three times a week    Attends Religious Services: 1 to 4 times per year    Active Member of [employer] or Organizations: No    Attends Archivist Meetings: Never    Marital Status: Divorced  Human resources officer Violence: Not At Risk (07/13/2021)   Humiliation, Afraid, Rape, and Kick questionnaire    Fear of Current or Ex-Partner: No    Emotionally Abused: No    Physically Abused: No    Sexually Abused: No      Allergies  Allergen Reactions   Baclofen Other (See Comments)    Altered mental status requiring a 3-day hospital stay- patient became unresponsive   Dust Mite Extract Shortness Of Breath and Other (See Comments)    "sneezing" (02/20/2012)   Molds & Smuts Shortness Of Breath   Morphine And Related Hives and Itching   Other Shortness Of Breath and Other (See Comments)    Grass and weeds "sneezing; filled sinuses" (02/20/2012)   Penicillins Rash and Other (See Comments)    "welts" (02/20/2012) Tolerated Ancef 02/16/20.    Rofecoxib Swelling and Other (See Comments)    Vioxx- feet swelling    Shellfish Allergy Anaphylaxis, Shortness Of Breath, Itching and Rash   Tetracycline Hcl Nausea And Vomiting   Xolair [Omalizumab] Other (See Comments)    Caused Blood clot   Zoledronic Acid Other (See Comments)    Fever, Put in hospital, dr said it was a reaction from a reaction  Other reaction(s): Unknown   Dilaudid [Hydromorphone Hcl] Itching   Levofloxacin Other (See Comments)    GI upset   Oxycodone Hcl Nausea And Vomiting   Paroxetine Nausea And Vomiting and Other (See Comments)    Paxil    Celecoxib Swelling and Other (See Comments)    Feet swelling   Diltiazem Hcl Swelling   Hydrocodone-Acetaminophen Itching   Lactose Intolerance (Gi) Other (See Comments)    Bloating and gas   Rituximab     Anemia    Tree Extract Other (See Comments)    "tested and told I was allergic to it; never experienced a reaction to it" (02/20/2012)   Gamunex [Immune Globulin] Itching and Rash    Tolerates hizentra    Penicillin G Procaine Rash and Other (See Comments)    Welts, also     Outpatient Medications Prior to Visit  Medication Sig Dispense Refill   acetaminophen (TYLENOL) 650 MG CR tablet Take 1,300 mg by mouth every 8 (eight) hours as needed for pain.     albuterol (VENTOLIN HFA) 108 (90 Base) MCG/ACT inhaler Inhale 2 puffs into the lungs every 6 (six) hours as needed for wheezing or shortness  of breath. 19 g 1   allopurinol (ZYLOPRIM) 100 MG tablet TAKE 1 TABLET BY MOUTH DAILY 90 tablet 2   Alpha-D-Galactosidase (BEANO PO) Take 2 tablets by mouth 3 (three) times daily with meals.     Alpha-Lipoic Acid 600 MG TABS Take 600 mg by mouth daily.     ammonium lactate (AMLACTIN DAILY) 12 % lotion Apply 1 Application topically as needed for dry skin. 400 g 0   atorvastatin (LIPITOR) 80 MG tablet TAKE 1 TABLET BY MOUTH DAILY 90 tablet 2   azelastine (ASTELIN) 0.1 % nasal spray Place 2 sprays into both nostrils daily. Use in each nostril as directed 30 mL 5   benzonatate (TESSALON) 200 MG capsule TAKE 1 CAPSULE BY MOUTH THREE TIMES DAILY AS NEEDED FOR COUGH 30 capsule 0   budesonide (PULMICORT) 1 MG/2ML nebulizer solution Take 2 mLs (1 mg total) by nebulization daily. 60 mL 0   Calcium-Magnesium-Vitamin D (CALCIUM MAGNESIUM PO) Take 1 tablet by mouth daily.     cetirizine (ZYRTEC) 10 MG tablet Take 1 tablet (10 mg total) by mouth 2 (two) times daily as needed for allergies (Can take a n extra dose during flare ups.). (Patient taking differently: Take 10 mg by mouth every evening.) 60 tablet 5   cholecalciferol (VITAMIN D) 25 MCG (1000 UNIT) tablet Take 1,000 Units by mouth every  evening.     clopidogrel (PLAVIX) 75 MG tablet Take 1 tablet (75 mg total) by mouth daily. 90 tablet 3   cyproheptadine (PERIACTIN) 4 MG tablet Take 2 tablets (8 mg total) by mouth at bedtime. Take 2 tablets at bedtime as needed to treat facial pain or sleep dysfunction. (Patient taking differently: Take 8 mg by mouth at bedtime.) 60 tablet 5   denosumab (PROLIA) 60 MG/ML SOSY injection Inject 60 mg into the skin every 6 (six) months.     dexlansoprazole (DEXILANT) 60 MG capsule Take 1 capsule (60 mg total) by mouth daily. 90 capsule 3   diclofenac Sodium (VOLTAREN) 1 % GEL Apply 1 Application topically 4 (four) times daily as needed (pain). 100 g 3   empagliflozin (JARDIANCE) 10 MG TABS tablet Take 1 tablet (10 mg total)  by mouth daily before breakfast. 30 tablet 5   EPINEPHrine 0.3 mg/0.3 mL IJ SOAJ injection USE AS DIRECTED BY YOUR PHYSICIAN INTRAMUSCULARLY AS NEEDED FOR ANAPHYLAXIS 2 each 1   famotidine (PEPCID) 20 MG tablet Take 1 tablet (20 mg total) by mouth at bedtime. 30 tablet 5   fluticasone (FLONASE) 50 MCG/ACT nasal spray USE 2 SPRAYS IN EACH NOSTRIL DAILY (Patient taking differently: as needed.) 16 g 2   folic acid (FOLVITE) 1 MG tablet Take 1 tablet (1 mg total) by mouth daily. 90 tablet 3   furosemide (LASIX) 40 MG tablet Take 1 tablet (40 mg total) by mouth daily. 90 tablet 2   gabapentin (NEURONTIN) 300 MG capsule TAKE 1 CAPSULE BY MOUTH 3 TIMES DAILY AS NEEDED 90 capsule 2   guaiFENesin (MUCINEX) 600 MG 12 hr tablet Take 1 tablet (600 mg total) by mouth 2 (two) times daily as needed for cough or to loosen phlegm. (Patient taking differently: Take 600 mg by mouth 2 (two) times daily.) 60 tablet 1   HIZENTRA 1 GM/5ML SOLN Hizentra 3 times/5 ml = 65 ml total once weekly on Fridays     hydroxychloroquine (PLAQUENIL) 200 MG tablet Take 1 tablet (200 mg total) by mouth daily. 30 tablet 2   ipratropium-albuterol (DUONEB) 0.5-2.5 (3) MG/3ML SOLN Take 3 mLs by nebulization every 6 (six) hours as needed. 360 mL 6   isosorbide-hydrALAZINE (BIDIL) 20-37.5 MG tablet TAKE 1 TABLET BY MOUTH TWICE DAILY 60 tablet 1   Lactase (LACTOSE FAST ACTING RELIEF PO) Take 3 tablets by mouth 3 (three) times daily.     lidocaine-prilocaine (EMLA) cream Apply 1 application  topically as needed (pain).     magnesium oxide (MAG-OX) 400 (240 Mg) MG tablet Take 400 mg by mouth daily.     Menthol, Topical Analgesic, (BIOFREEZE) 4 % GEL Apply 1 application. topically daily as needed (pain).     mirtazapine (REMERON SOL-TAB) 30 MG disintegrating tablet Take 1 tablet (30 mg total) by mouth at bedtime as needed (sleep). 90 tablet 3   montelukast (SINGULAIR) 10 MG tablet Take 1 tablet (10 mg total) by mouth daily. 30 tablet 5   Nystatin  (GERHARDT'S BUTT CREAM) CREA Apply topically 3 times a day as needed for itching or irritation 4 each 6   nystatin (MYCOSTATIN) 100000 UNIT/ML suspension Use as directed 15 mLs in the mouth or throat at bedtime as needed (mouth sores).     nystatin-triamcinolone ointment (MYCOLOG) Apply 1 Application topically 2 (two) times daily as needed (vaginal irritation).     potassium chloride (KLOR-CON M) 10 MEQ tablet Take 1 tablet (10 mEq total) by mouth daily. 90 tablet 1  predniSONE (DELTASONE) 1 MG tablet Take 2 tablets (2 mg total) by mouth daily. Total of 7 mg daily. 180 tablet 0   predniSONE (DELTASONE) 5 MG tablet Take 1 tablet (5 mg total) by mouth daily with breakfast. Along with 2 mg tablets for total daily dose 7 mg total daily 90 tablet 0   Prenatal Vit-Fe Fumarate-FA (PRENATAL VITAMINS PO) Take 1 tablet by mouth daily.     Probiotic Product (ALIGN PO) Take 1 capsule by mouth daily.     Propylene Glycol (SYSTANE COMPLETE) 0.6 % SOLN Place 1 drop into both eyes 2 (two) times daily. 15 mL 5   rifaximin (XIFAXAN) 550 MG TABS tablet Take 1 tablet (550 mg total) by mouth 3 (three) times daily. 42 tablet 0   Tiotropium Bromide-Olodaterol (STIOLTO RESPIMAT) 2.5-2.5 MCG/ACT AERS Inhale 2 puffs into the lungs daily. (Patient taking differently: Inhale 2 puffs into the lungs every evening.) 4 g 11   traMADol (ULTRAM) 50 MG tablet Take 1 tablet (50 mg total) by mouth at bedtime. 30 tablet 2   valsartan (DIOVAN) 40 MG tablet Take 1 tablet (40 mg total) by mouth daily. 90 tablet 3   XARELTO 20 MG TABS tablet TAKE 1 TABLET BY MOUTH DAILY WITH SUPPER 90 tablet 2   fluconazole (DIFLUCAN) 100 MG tablet Take 1 tablet (100 mg total) by mouth daily. 3 tablet 0   Facility-Administered Medications Prior to Visit  Medication Dose Route Frequency Provider Last Rate Last Admin   iohexol (OMNIPAQUE) 350 MG/ML injection    PRN Leonie Man, MD   105 mL at 01/11/21 0955   Review of Systems  Constitutional:   Negative for chills, fever, malaise/fatigue and weight loss.  HENT:  Negative for congestion, ear pain, sinus pain and sore throat.   Eyes: Negative.   Respiratory:  Positive for cough, sputum production, shortness of breath and wheezing. Negative for hemoptysis.   Cardiovascular:  Negative for chest pain, palpitations, orthopnea, claudication and leg swelling.  Gastrointestinal:  Negative for abdominal pain, heartburn, nausea and vomiting.  Genitourinary: Negative.   Musculoskeletal:  Negative for joint pain and myalgias.  Skin:  Negative for rash.  Neurological:  Negative for weakness.  Endo/Heme/Allergies: Negative.   Psychiatric/Behavioral: Negative.      Objective:   Vitals:   03/19/22 1056  BP: 120/60  Pulse: 72  SpO2: 96%  Weight: 137 lb (62.1 kg)  Height: 5' 1"  (1.549 m)   Physical Exam Constitutional:      General: She is not in acute distress.    Appearance: She is not ill-appearing.  HENT:     Head: Normocephalic and atraumatic.  Eyes:     General: No scleral icterus.    Conjunctiva/sclera: Conjunctivae normal.     Pupils: Pupils are equal, round, and reactive to light.  Cardiovascular:     Rate and Rhythm: Normal rate and regular rhythm.     Pulses: Normal pulses.     Heart sounds: Normal heart sounds. No murmur heard. Pulmonary:     Effort: Pulmonary effort is normal.     Breath sounds: No wheezing, rhonchi or rales.  Abdominal:     General: Bowel sounds are normal.     Palpations: Abdomen is soft.  Musculoskeletal:     Right lower leg: No edema.     Left lower leg: No edema.  Skin:    General: Skin is warm and dry.  Neurological:     General: No focal deficit present.  Mental Status: She is alert.    CBC    Component Value Date/Time   WBC 10.9 (H) 03/07/2022 1223   RBC 3.37 (L) 03/07/2022 1223   HGB 11.9 03/07/2022 1223   HGB 10.4 (L) 12/21/2021 1031   HGB 6.4 (LL) 02/03/2021 0817   HCT 35.4 03/07/2022 1223   HCT 20.6 (L) 02/03/2021 0817    PLT 259 03/07/2022 1223   PLT 204 12/21/2021 1031   PLT 306 02/03/2021 0817   MCV 105.0 (H) 03/07/2022 1223   MCV 102 (H) 02/03/2021 0817   MCH 35.3 (H) 03/07/2022 1223   MCHC 33.6 03/07/2022 1223   RDW 14.8 03/07/2022 1223   RDW 16.3 (H) 02/03/2021 0817   LYMPHSABS 1,264 03/07/2022 1223   MONOABS 1.2 (H) 12/21/2021 1031   EOSABS 120 03/07/2022 1223   BASOSABS 55 03/07/2022 1223      Latest Ref Rng & Units 03/07/2022   12:23 PM 12/21/2021   10:31 AM 09/19/2021   10:18 AM  BMP  Glucose 65 - 99 mg/dL 133  168  177   BUN 7 - 25 mg/dL 32  42  35   Creatinine 0.60 - 1.00 mg/dL 1.68  1.93  1.72   BUN/Creat Ratio 6 - 22 (calc) 19     Sodium 135 - 146 mmol/L 143  142  140   Potassium 3.5 - 5.3 mmol/L 4.8  4.4  4.4   Chloride 98 - 110 mmol/L 103  105  106   CO2 20 - 32 mmol/L 27  26  26    Calcium 8.6 - 10.4 mg/dL 9.9  9.4  8.8    Chest imaging: Chest Imaging- films reviewed: CT Chest wo contrast 08/23/20 - new trace bilateral pleural effusions and septal thickening. Decrease and resolution of pulmonary nodules.   CT Chest wo contrast 05/25/20 - near resolution of the RUL 44m nodule. Nodular opacity of medial RUL is now 614mfrom 1342mThe opacity of the medial RLL is now 1.8x1.0cm from 2.0x1.1 and the LLL opacity is 1.9x1.6cm from 3.7x3.7cm.  CT chest wo contrast 03/25/2020 - multifocal areas of nodularity and consolidative changes. Findings consistent with atypical infection or possible malignancy.  CT high-resolution 10/24/2018-mild bronchiectasis right middle lobe, lingula, right lower lobe, but does taper more appropriately distally.  Persistent nodules in right middle lobe and lingula with mild tree-in-bud opacities.  No inter or intralobular septal thickening or significant groundglass.  Expiratory images with some mosaicism and significant bronchial and tracheal collapse, especially on the right.    HRCT chest 08/30/19: RUL TIB opacities, multilobar bronchiectasis. Tracheobronchomalacia  progressed since previous CT scan. Subacute rib fractures- healing. CAD.  PFT:    Latest Ref Rng & Units 08/20/2019    9:55 AM 09/18/2018   10:00 AM 06/19/2017    9:48 AM 08/08/2015   10:35 AM 04/29/2015    9:55 AM  PFT Results  FVC-Pre L 1.72  1.74  P 2.00  2.08  2.24   FVC-Predicted Pre % 66  66  P 74  75  81   FVC-Post L 1.65  1.61  P 1.86  2.17  2.25   FVC-Predicted Post % 63  61  P 69  78  81   Pre FEV1/FVC % % 90  88  P 90  83  87   Post FEV1/FCV % % 90  91  P 93  87  84   FEV1-Pre L 1.55  1.53  P 1.80  1.73  1.95  FEV1-Predicted Pre % 79  76  P 88  82  92   FEV1-Post L 1.49  1.47  P 1.72  1.89  1.89   DLCO uncorrected ml/min/mmHg 11.33  13.51  P 13.68  12.36  12.99   DLCO UNC% % 64  77  P 66  60  63   DLCO corrected ml/min/mmHg 11.33    13.76  12.87   DLCO COR %Predicted % 64    66  62   DLVA Predicted % 83  92  P 85  94  81   TLC L 3.71  3.52  P 3.95   3.91   TLC % Predicted % 80  76  P 85   84   RV % Predicted % 76  79  P 86   83     P Preliminary result   2020-no significant obstruction, mild restriction, mild DLCO reduction.  Flow volume loops consistent with restriction.   2021-no obstruction or bronchodilator reversibility.  No significant restriction.  Mild diffusion impairment.  Compared to 2020, mild improvement.   Previous respiratory cultures- all fungal and AFB negative from right middle lobe and lingula during bronchoscopy 08/2016, only positive for normal flora  BAL 04/05/20 - Candida Albicans    Pathology 08/23/2016: BAL lingula-no AFB or fungus   Echocardiogram: Unable to review report from 02/2018.  08/05/2017 demonstrates LVEF 65 to 13%, grade 1 diastolic dysfunction.  Normal LA, RV, RA.  Trivial TR and MR, otherwise normal valves.   Echocardiogram 07/10/2019-LVEF greater than 75% with hyperdynamic function, moderate LVH with grade 1 diastolic dysfunction.  Normal LA, RV, RA.  Trivial AR, otherwise normal valves.  Allergy panel 04/02/2016-increased IgE for  Aspergillus, otherwise negative Alpha-1 antitrypsin 170   Bronchoscopy 12/24/2018 (performed at [facility]) Neutrophils 12% Lymphocytes 1% Macrophages 79% Respiratory viral panel negative Unable to see PJP DNA Respiratory culture-1+ mixed gram-negative rods, normal flora Fungus culture negative AFB negative   08/25/19 sputum- Pan-sensitive Pseudomonas   Barium Swallow 02/15/2015  1. Small type 1 transient hiatal hernia. 2. Incomplete epiglottic inversion during swallowing, with laryngeal penetration of thick liquid to the level of the vocal cords. 3. No stricture or dysmotility identified. 4. 4 mm anterolisthesis at C4-5.  Assessment & Plan:   Severe persistent asthma with acute exacerbation - Plan: doxycycline (VIBRA-TABS) 100 MG tablet, Pulmonary Function Test  Rheumatoid arthritis involving hand with negative rheumatoid factor, unspecified laterality ([place]) - Plan: CT CHEST HIGH RESOLUTION  Chronic cough - Plan: CT CHEST HIGH RESOLUTION  Bronchiectasis without complication (HCC) - Plan: CT CHEST HIGH RESOLUTION  Yeast infection - Plan: fluconazole (DIFLUCAN) 100 MG tablet  Discussion: Galilee Pierron is a 72 year old woman, never smoker with asthma, tracheobronchomalacia on CPAP with oxygen, bronchiectasis and rheumatoid arthritis on prednisone (previously on leflunamide and rituximab) who returns to pulmonary clinic for follow up.   She appears to have increase in her asthma symptoms along with possible sinus infection.   She is to start doxycycline 1 tab twice daily for 7 days.    She is to continue budesonide nebs twice daily along with stiolto inhaler and as needed albuterol.   Her chronic cough and breathing issues due in combination to her swallowing issues with incomplete epiglottic inversion leading to laryngeal penetration of liquid to the vocal cords.  She also has bronchiectasis and tracheobronchomalacia.  We will check CT Chest scan.  Follow up in 3 months  with PFTs.  Freda Jackson, MD [facility] Office: [phone]   Current Outpatient Medications:    acetaminophen (TYLENOL) 650 MG CR tablet, Take 1,300 mg by mouth every 8 (eight) hours as needed for pain., Disp: , Rfl:    albuterol (VENTOLIN HFA) 108 (90 Base) MCG/ACT inhaler, Inhale 2 puffs into the lungs every 6 (six) hours as needed for wheezing or shortness of breath., Disp: 19 g, Rfl: 1   allopurinol (ZYLOPRIM) 100 MG tablet, TAKE 1 TABLET BY MOUTH DAILY, Disp: 90 tablet, Rfl: 2   Alpha-D-Galactosidase (BEANO PO), Take 2 tablets by mouth 3 (three) times daily with meals., Disp: , Rfl:    Alpha-Lipoic Acid 600 MG TABS, Take 600 mg by mouth daily., Disp: , Rfl:    ammonium lactate (AMLACTIN DAILY) 12 % lotion, Apply 1 Application topically as needed for dry skin., Disp: 400 g, Rfl: 0   atorvastatin (LIPITOR) 80 MG tablet, TAKE 1 TABLET BY MOUTH DAILY, Disp: 90 tablet, Rfl: 2   azelastine (ASTELIN) 0.1 % nasal spray, Place 2 sprays into both nostrils daily. Use in each nostril as directed, Disp: 30 mL, Rfl: 5   benzonatate (TESSALON) 200 MG capsule, TAKE 1 CAPSULE BY MOUTH THREE TIMES DAILY AS NEEDED FOR COUGH, Disp: 30 capsule, Rfl: 0   budesonide (PULMICORT) 1 MG/2ML nebulizer solution, Take 2 mLs (1 mg total) by nebulization daily., Disp: 60 mL, Rfl: 0   Calcium-Magnesium-Vitamin D (CALCIUM MAGNESIUM PO), Take 1 tablet by mouth daily., Disp: , Rfl:    cetirizine (ZYRTEC) 10 MG tablet, Take 1 tablet (10 mg total) by mouth 2 (two) times daily as needed for allergies (Can take a n extra dose during flare ups.). (Patient taking differently: Take 10 mg by mouth every evening.), Disp: 60 tablet, Rfl: 5   cholecalciferol (VITAMIN D) 25 MCG (1000 UNIT) tablet, Take 1,000 Units by mouth every evening., Disp: , Rfl:    clopidogrel (PLAVIX) 75 MG tablet, Take 1 tablet (75 mg total) by mouth daily., Disp: 90 tablet, Rfl: 3   cyproheptadine (PERIACTIN) 4 MG tablet, Take 2  tablets (8 mg total) by mouth at bedtime. Take 2 tablets at bedtime as needed to treat facial pain or sleep dysfunction. (Patient taking differently: Take 8 mg by mouth at bedtime.), Disp: 60 tablet, Rfl: 5   denosumab (PROLIA) 60 MG/ML SOSY injection, Inject 60 mg into the skin every 6 (six) months., Disp: , Rfl:    dexlansoprazole (DEXILANT) 60 MG capsule, Take 1 capsule (60 mg total) by mouth daily., Disp: 90 capsule, Rfl: 3   diclofenac Sodium (VOLTAREN) 1 % GEL, Apply 1 Application topically 4 (four) times daily as needed (pain)., Disp: 100 g, Rfl: 3   doxycycline (VIBRA-TABS) 100 MG tablet, Take 1 tablet (100 mg total) by mouth 2 (two) times daily., Disp: 14 tablet, Rfl: 0   empagliflozin (JARDIANCE) 10 MG TABS tablet, Take 1 tablet (10 mg total) by mouth daily before breakfast., Disp: 30 tablet, Rfl: 5   EPINEPHrine 0.3 mg/0.3 mL IJ SOAJ injection, USE AS DIRECTED BY YOUR PHYSICIAN INTRAMUSCULARLY AS NEEDED FOR ANAPHYLAXIS, Disp: 2 each, Rfl: 1   famotidine (PEPCID) 20 MG tablet, Take 1 tablet (20 mg total) by mouth at bedtime., Disp: 30 tablet, Rfl: 5   fluticasone (FLONASE) 50 MCG/ACT nasal spray, USE 2 SPRAYS IN EACH NOSTRIL DAILY (Patient taking differently: as needed.), Disp: 16 g, Rfl: 2   folic acid (FOLVITE) 1 MG tablet, Take 1 tablet (1 mg total) by mouth daily., Disp: 90 tablet, Rfl: 3  furosemide (LASIX) 40 MG tablet, Take 1 tablet (40 mg total) by mouth daily., Disp: 90 tablet, Rfl: 2   gabapentin (NEURONTIN) 300 MG capsule, TAKE 1 CAPSULE BY MOUTH 3 TIMES DAILY AS NEEDED, Disp: 90 capsule, Rfl: 2   guaiFENesin (MUCINEX) 600 MG 12 hr tablet, Take 1 tablet (600 mg total) by mouth 2 (two) times daily as needed for cough or to loosen phlegm. (Patient taking differently: Take 600 mg by mouth 2 (two) times daily.), Disp: 60 tablet, Rfl: 1   HIZENTRA 1 GM/5ML SOLN, Hizentra 3 times/5 ml = 65 ml total once weekly on Fridays, Disp: , Rfl:    hydroxychloroquine (PLAQUENIL) 200 MG tablet,  Take 1 tablet (200 mg total) by mouth daily., Disp: 30 tablet, Rfl: 2   ipratropium-albuterol (DUONEB) 0.5-2.5 (3) MG/3ML SOLN, Take 3 mLs by nebulization every 6 (six) hours as needed., Disp: 360 mL, Rfl: 6   isosorbide-hydrALAZINE (BIDIL) 20-37.5 MG tablet, TAKE 1 TABLET BY MOUTH TWICE DAILY, Disp: 60 tablet, Rfl: 1   Lactase (LACTOSE FAST ACTING RELIEF PO), Take 3 tablets by mouth 3 (three) times daily., Disp: , Rfl:    lidocaine-prilocaine (EMLA) cream, Apply 1 application  topically as needed (pain)., Disp: , Rfl:    magnesium oxide (MAG-OX) 400 (240 Mg) MG tablet, Take 400 mg by mouth daily., Disp: , Rfl:    Menthol, Topical Analgesic, (BIOFREEZE) 4 % GEL, Apply 1 application. topically daily as needed (pain)., Disp: , Rfl:    mirtazapine (REMERON SOL-TAB) 30 MG disintegrating tablet, Take 1 tablet (30 mg total) by mouth at bedtime as needed (sleep)., Disp: 90 tablet, Rfl: 3   montelukast (SINGULAIR) 10 MG tablet, Take 1 tablet (10 mg total) by mouth daily., Disp: 30 tablet, Rfl: 5   Nystatin (GERHARDT'S BUTT CREAM) CREA, Apply topically 3 times a day as needed for itching or irritation, Disp: 4 each, Rfl: 6   nystatin (MYCOSTATIN) 100000 UNIT/ML suspension, Use as directed 15 mLs in the mouth or throat at bedtime as needed (mouth sores)., Disp: , Rfl:    nystatin-triamcinolone ointment (MYCOLOG), Apply 1 Application topically 2 (two) times daily as needed (vaginal irritation)., Disp: , Rfl:    potassium chloride (KLOR-CON M) 10 MEQ tablet, Take 1 tablet (10 mEq total) by mouth daily., Disp: 90 tablet, Rfl: 1   predniSONE (DELTASONE) 1 MG tablet, Take 2 tablets (2 mg total) by mouth daily. Total of 7 mg daily., Disp: 180 tablet, Rfl: 0   predniSONE (DELTASONE) 5 MG tablet, Take 1 tablet (5 mg total) by mouth daily with breakfast. Along with 2 mg tablets for total daily dose 7 mg total daily, Disp: 90 tablet, Rfl: 0   Prenatal Vit-Fe Fumarate-FA (PRENATAL VITAMINS PO), Take 1 tablet by mouth  daily., Disp: , Rfl:    Probiotic Product (ALIGN PO), Take 1 capsule by mouth daily., Disp: , Rfl:    Propylene Glycol (SYSTANE COMPLETE) 0.6 % SOLN, Place 1 drop into both eyes 2 (two) times daily., Disp: 15 mL, Rfl: 5   rifaximin (XIFAXAN) 550 MG TABS tablet, Take 1 tablet (550 mg total) by mouth 3 (three) times daily., Disp: 42 tablet, Rfl: 0   Tiotropium Bromide-Olodaterol (STIOLTO RESPIMAT) 2.5-2.5 MCG/ACT AERS, Inhale 2 puffs into the lungs daily. (Patient taking differently: Inhale 2 puffs into the lungs every evening.), Disp: 4 g, Rfl: 11   traMADol (ULTRAM) 50 MG tablet, Take 1 tablet (50 mg total) by mouth at bedtime., Disp: 30 tablet, Rfl: 2   valsartan (DIOVAN)  40 MG tablet, Take 1 tablet (40 mg total) by mouth daily., Disp: 90 tablet, Rfl: 3   XARELTO 20 MG TABS tablet, TAKE 1 TABLET BY MOUTH DAILY WITH SUPPER, Disp: 90 tablet, Rfl: 2   fluconazole (DIFLUCAN) 100 MG tablet, Take 1 tablet (100 mg total) by mouth daily., Disp: 3 tablet, Rfl: 0 No current facility-administered medications for this visit.  Facility-Administered Medications Ordered in Other Visits:    iohexol (OMNIPAQUE) 350 MG/ML injection, , , PRN, Leonie Man, MD, 105 mL at 01/11/21 [phone]

## 2022-03-19 NOTE — Patient Instructions (Addendum)
Start doxycycline 1 tab twice daily for 7 days  Resume flutter valve use at leaset 2 times per day to help with mucous clearance  Continue to use nebulizer treatments  We will schedule you for a CT Chest scan  Follow up in 3 months with pulmonary function testing

## 2022-03-22 ENCOUNTER — Encounter: Payer: Self-pay | Admitting: Pulmonary Disease

## 2022-03-30 ENCOUNTER — Telehealth: Payer: Self-pay | Admitting: Hematology and Oncology

## 2022-03-30 NOTE — Telephone Encounter (Signed)
Per Dorsey's schedule he is unavailable, rescheduled pt left voicemail to either call to confirm or reschedule

## 2022-04-02 ENCOUNTER — Other Ambulatory Visit: Payer: Self-pay | Admitting: Family Medicine

## 2022-04-02 DIAGNOSIS — Z1231 Encounter for screening mammogram for malignant neoplasm of breast: Secondary | ICD-10-CM

## 2022-04-04 ENCOUNTER — Other Ambulatory Visit: Payer: Self-pay | Admitting: Allergy and Immunology

## 2022-04-06 ENCOUNTER — Ambulatory Visit: Payer: Medicare PPO | Admitting: Internal Medicine

## 2022-04-06 ENCOUNTER — Encounter: Payer: Self-pay | Admitting: Internal Medicine

## 2022-04-06 VITALS — BP 130/68 | HR 70 | Ht 61.0 in | Wt 137.0 lb

## 2022-04-06 DIAGNOSIS — Z86010 Personal history of colonic polyps: Secondary | ICD-10-CM

## 2022-04-06 DIAGNOSIS — K638219 Small intestinal bacterial overgrowth, unspecified: Secondary | ICD-10-CM | POA: Diagnosis not present

## 2022-04-06 DIAGNOSIS — D839 Common variable immunodeficiency, unspecified: Secondary | ICD-10-CM

## 2022-04-06 MED ORDER — METRONIDAZOLE 250 MG PO TABS: 250.0000 mg | ORAL_TABLET | Freq: Three times a day (TID) | ORAL | 0 refills | Status: DC

## 2022-04-06 NOTE — Progress Notes (Signed)
Ebony Scott 72 y.o. [birth year] [id number]  Assessment & Plan:   Encounter Diagnoses  Name Primary?   Small intestinal bacterial overgrowth (SIBO) - suspected Yes   CVID (common variable immunodeficiency) (HCC)    Hx of adenomatous colonic polyps    She response to antibiotic therapy for suspected SIBO.  She may be predisposed to this with her common variable immune deficiency.  I am going to treat her with metronidazole 250 mg 3 times daily for 10 days.  I cannot find any evidence that indicates her subcutaneous gammaglobulin injections lead to abdominal distention.  Discontinue colon polyp surveillance  She will follow-up as needed.  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  She was last here in November 2023 with flatulent bloating and gas symptoms and I treated her with Xifaxan samples and she had a few months of relief.  She is starting to have more bloating abdominal distention and flatulence.  She relates it to her gammaglobulin injections that are injected into the abdominal subcutaneous tissues.  Otherwise things are stable.  She is still frail and has to use a walker for ambulation and is on chronic therapy for asthma.  She remains on chronic prednisone therapy.  She has a history of colon polyps but does not want surveillance. Allergies  Allergen Reactions   Baclofen Other (See Comments)    Altered mental status requiring a 3-day hospital stay- patient became unresponsive   Dust Mite Extract Shortness Of Breath and Other (See Comments)    "sneezing" (02/20/2012)   Molds & Smuts Shortness Of Breath   Morphine And Related Hives and Itching   Other Shortness Of Breath and Other (See Comments)    Grass and weeds "sneezing; filled sinuses" (02/20/2012)   Penicillins Rash and Other (See Comments)    "welts"  (02/20/2012) Tolerated Ancef 02/16/20.    Rofecoxib Swelling and Other (See Comments)    Vioxx- feet swelling    Shellfish Allergy Anaphylaxis, Shortness Of Breath, Itching and Rash   Tetracycline Hcl Nausea And Vomiting   Xolair [Omalizumab] Other (See Comments)    Caused Blood clot   Zoledronic Acid Other (See Comments)    Fever, Put in hospital, dr said it was a reaction from a reaction  Other reaction(s): Unknown   Dilaudid [Hydromorphone Hcl] Itching   Levofloxacin Other (See Comments)    GI upset   Oxycodone Hcl Nausea And Vomiting   Paroxetine Nausea And Vomiting and Other (See Comments)    Paxil    Celecoxib Swelling and Other (See Comments)    Feet swelling   Diltiazem Hcl Swelling   Hydrocodone-Acetaminophen Itching   Lactose Intolerance (Gi) Other (See Comments)    Bloating and gas   Rituximab     Anemia    Tree Extract Other (See Comments)    "tested and told I was allergic to it; never experienced a reaction to it" (02/20/2012)   Gamunex [Immune Globulin] Itching and Rash    Tolerates hizentra    Penicillin G Procaine Rash and Other (See Comments)    Welts, also   Current Meds  Medication Sig   acetaminophen (TYLENOL) 650 MG CR tablet Take 1,300 mg by mouth every 8 (eight) hours as needed for pain.   albuterol (VENTOLIN HFA) 108 (90 Base) MCG/ACT inhaler Inhale 2 puffs into the lungs every 6 (six) hours as  needed for wheezing or shortness of breath.   allopurinol (ZYLOPRIM) 100 MG tablet TAKE 1 TABLET BY MOUTH DAILY   Alpha-D-Galactosidase (BEANO PO) Take 2 tablets by mouth 3 (three) times daily with meals.   Alpha-Lipoic Acid 600 MG TABS Take 600 mg by mouth daily.   ammonium lactate (AMLACTIN DAILY) 12 % lotion Apply 1 Application topically as needed for dry skin.   atorvastatin (LIPITOR) 80 MG tablet TAKE 1 TABLET BY MOUTH DAILY   azelastine (ASTELIN) 0.1 % nasal spray Place 2 sprays into both nostrils daily. Use in each nostril as directed   benzonatate  (TESSALON) 200 MG capsule TAKE 1 CAPSULE BY MOUTH THREE TIMES DAILY AS NEEDED FOR COUGH   budesonide (PULMICORT) 1 MG/2ML nebulizer solution Take 2 mLs (1 mg total) by nebulization daily.   Calcium-Magnesium-Vitamin D (CALCIUM MAGNESIUM PO) Take 1 tablet by mouth daily.   cetirizine (ZYRTEC) 10 MG tablet Take 1 tablet (10 mg total) by mouth 2 (two) times daily as needed for allergies (Can take a n extra dose during flare ups.). (Patient taking differently: Take 10 mg by mouth every evening.)   cholecalciferol (VITAMIN D) 25 MCG (1000 UNIT) tablet Take 1,000 Units by mouth every evening.   clopidogrel (PLAVIX) 75 MG tablet Take 1 tablet (75 mg total) by mouth daily.   cyproheptadine (PERIACTIN) 4 MG tablet Take 2 tablets (8 mg total) by mouth at bedtime. Take 2 tablets at bedtime as needed to treat facial pain or sleep dysfunction. (Patient taking differently: Take 8 mg by mouth at bedtime.)   denosumab (PROLIA) 60 MG/ML SOSY injection Inject 60 mg into the skin every 6 (six) months.   dexlansoprazole (DEXILANT) 60 MG capsule Take 1 capsule (60 mg total) by mouth daily.   diclofenac Sodium (VOLTAREN) 1 % GEL Apply 1 Application topically 4 (four) times daily as needed (pain).   empagliflozin (JARDIANCE) 10 MG TABS tablet Take 1 tablet (10 mg total) by mouth daily before breakfast.   EPINEPHrine 0.3 mg/0.3 mL IJ SOAJ injection USE AS DIRECTED BY YOUR PHYSICIAN INTRAMUSCULARLY AS NEEDED FOR ANAPHYLAXIS   famotidine (PEPCID) 20 MG tablet TAKE 1 TABLET BY MOUTH EACH NIGHT AT BEDTIME   fluticasone (FLONASE) 50 MCG/ACT nasal spray USE 2 SPRAYS IN EACH NOSTRIL DAILY (Patient taking differently: as needed.)   folic acid (FOLVITE) 1 MG tablet Take 1 tablet (1 mg total) by mouth daily.   furosemide (LASIX) 40 MG tablet Take 1 tablet (40 mg total) by mouth daily.   gabapentin (NEURONTIN) 300 MG capsule TAKE 1 CAPSULE BY MOUTH 3 TIMES DAILY AS NEEDED   guaiFENesin (MUCINEX) 600 MG 12 hr tablet Take 1 tablet (600  mg total) by mouth 2 (two) times daily as needed for cough or to loosen phlegm. (Patient taking differently: Take 600 mg by mouth 2 (two) times daily.)   HIZENTRA 1 GM/5ML SOLN Hizentra 3 times/5 ml = 65 ml total once weekly on Fridays   hydroxychloroquine (PLAQUENIL) 200 MG tablet Take 1 tablet (200 mg total) by mouth daily.   ipratropium-albuterol (DUONEB) 0.5-2.5 (3) MG/3ML SOLN Take 3 mLs by nebulization every 6 (six) hours as needed.   isosorbide-hydrALAZINE (BIDIL) 20-37.5 MG tablet TAKE 1 TABLET BY MOUTH TWICE DAILY   Lactase (LACTOSE FAST ACTING RELIEF PO) Take 3 tablets by mouth 3 (three) times daily.   lidocaine-prilocaine (EMLA) cream Apply 1 application  topically as needed (pain).   magnesium oxide (MAG-OX) 400 (240 Mg) MG tablet Take 400 mg by mouth daily.  Menthol, Topical Analgesic, (BIOFREEZE) 4 % GEL Apply 1 application. topically daily as needed (pain).   mirtazapine (REMERON SOL-TAB) 30 MG disintegrating tablet Take 1 tablet (30 mg total) by mouth at bedtime as needed (sleep).   montelukast (SINGULAIR) 10 MG tablet Take 1 tablet (10 mg total) by mouth daily.   Nystatin (GERHARDT'S BUTT CREAM) CREA Apply topically 3 times a day as needed for itching or irritation   nystatin (MYCOSTATIN) 100000 UNIT/ML suspension Use as directed 15 mLs in the mouth or throat at bedtime as needed (mouth sores).   nystatin-triamcinolone ointment (MYCOLOG) Apply 1 Application topically 2 (two) times daily as needed (vaginal irritation).   potassium chloride (KLOR-CON M) 10 MEQ tablet Take 1 tablet (10 mEq total) by mouth daily.   predniSONE (DELTASONE) 1 MG tablet Take 2 tablets (2 mg total) by mouth daily. Total of 7 mg daily.   predniSONE (DELTASONE) 5 MG tablet Take 1 tablet (5 mg total) by mouth daily with breakfast. Along with 2 mg tablets for total daily dose 7 mg total daily   Prenatal Vit-Fe Fumarate-FA (PRENATAL VITAMINS PO) Take 1 tablet by mouth daily.   Probiotic Product (ALIGN PO) Take  1 capsule by mouth daily.   Propylene Glycol (SYSTANE COMPLETE) 0.6 % SOLN Place 1 drop into both eyes 2 (two) times daily.       Tiotropium Bromide-2.5 MCG/ACT AERS Inhale 2 puffs into the lungs daily. (Patient taking differently: Inhale 2 puffs into the lungs every evening.)   traMADol (ULTRAM) 50 MG tablet Take 1 tablet (50 mg total) by mouth at bedtime.   valsartan (DIOVAN) 40 MG tablet Take 1 tablet (40 mg total) by mouth daily.   XARELTO 20 MG TABS tablet TAKE 1 TABLET BY MOUTH DAILY WITH SUPPER   -Olodaterol (STIOLTO RESPIMAT) 2.5        Past Medical History:  Diagnosis Date   Abscess of dorsum of right hand 09/22/2020   Acute deep vein thrombosis (DVT) of right lower extremity ([place]) 12/13/2017   Allergic rhinitis    Anemia    Angio-edema    Anxiety    pt denies dx   Arthritis    Phreesia 10/21/2019   Asthma    Phreesia 10/21/2019   Breast cancer ([place]) 1998   Left breast, in remission   Bronchiectasis ([place])    Cataract    REMOVED   CHF (congestive heart failure) ([place])    Clostridium difficile colitis Q000111Q   Complication of anesthesia    "had hard time waking up from it several times" (02/20/2012), no anesthesia problems since 2014   Coronary artery disease    Depression    "some; don't take anything for it" (02/20/2012), pt denies dx as of 06/02/21   Diverticulosis    DVT (deep venous thrombosis) ([place])    Dyspnea    all the time   Fibromyalgia 11/2011   GAVE (gastric antral vascular ectasia)    GERD (gastroesophageal reflux disease)    Graves disease    Headache(784.0)    "related to allergies; more at different times during the year" (02/20/2012)   Hemorrhoids    Hiatal hernia    back and neck   History of blood transfusion 2023   1 unit, 5 units of Iron   Hx of adenomatous colonic polyps 04/12/2016   Hypercholesteremia    good cholesterol is high   Hypothyroidism    IBS (irritable bowel syndrome)    Moderate persistent asthma    -FeV1 72% 2011, -  IgE 102  2011, CT sinus Neg 2011   Osteomyelitis of second toe of right foot ([place]) 09/23/2020   Osteoporosis    on reclast yearly   Peripheral vascular disease ([place]) 2019   DVTs (3) lungs and leg   Pneumonia 04/2011; ~ 11/2011   "double; single" (02/20/2012)   Septic olecranon bursitis of right elbow 09/22/2020   Seronegative rheumatoid arthritis ([place])    Dr. Lahoma Rocker   SIRS (systemic inflammatory response syndrome) ([place]) 02/10/2018   Sleep apnea    uses cpap nightly   Tracheobronchomalacia    Past Surgical History:  Procedure Laterality Date   ABDOMINAL HYSTERECTOMY N/A    Phreesia 10/21/2019   ANTERIOR AND POSTERIOR REPAIR  1990's   APPENDECTOMY     ARGON LASER APPLICATION  [id number]   Procedure: ARGON LASER APPLICATION;  Surgeon: Yetta Flock, MD;  Location: [facility];  Service: Gastroenterology;;   BREAST LUMPECTOMY  1998   left   BREAST SURGERY N/A    Phreesia 10/21/2019   BRONCHIAL WASHINGS  04/05/2020   Procedure: BRONCHIAL WASHINGS;  Surgeon: Freddi Starr, MD;  Location: [facility];  Service: Pulmonary;;   CAPSULOTOMY Right 08/28/2021   Procedure: CAPSULOTOMY;  Surgeon: Felipa Furnace, DPM;  Location: [facility];  Service: Podiatry;  Laterality: Right;   CARPOMETACARPEL ([place]) FUSION OF THUMB WITH AUTOGRAFT FROM RADIUS  ~ 2009   "both thumbs" (02/20/2012)   CATARACT EXTRACTION W/ INTRAOCULAR LENS  IMPLANT, BILATERAL  2012   CERVICAL DISCECTOMY  10/2001   C5-C6   CERVICAL FUSION  2003   C3-C4   CHOLECYSTECTOMY     COLONOSCOPY     CORONARY STENT INTERVENTION N/A 01/11/2021   Procedure: CORONARY STENT INTERVENTION;  Surgeon: Leonie Man, MD;  Location: [facility];  Service: Cardiovascular;  Laterality: N/A;   DEBRIDEMENT TENNIS ELBOW  ?1970's   right   ESOPHAGOGASTRODUODENOSCOPY     ESOPHAGOGASTRODUODENOSCOPY (EGD) WITH PROPOFOL N/A 02/05/2021   Procedure: ESOPHAGOGASTRODUODENOSCOPY (EGD) WITH PROPOFOL;  Surgeon: Yetta Flock, MD;  Location:  [facility];  Service: Gastroenterology;  Laterality: N/A;   EYE SURGERY N/A    Phreesia 10/21/2019   HAMMER TOE SURGERY Right 08/28/2021   Procedure: HAMMER TOE REPAIR 2ND TOE RIGHT FOOT;  Surgeon: Felipa Furnace, DPM;  Location: [facility];  Service: Podiatry;  Laterality: Right;   HYSTERECTOMY     I & D EXTREMITY Right 09/22/2020   Procedure: IRRIGATION AND DEBRIDEMENT RIGHT HAND AND ELBOW;  Surgeon: Marchia Bond, MD;  Location: [facility];  Service: Orthopedics;  Laterality: Right;   KNEE ARTHROPLASTY  ?1990's   "?right; w/cartilage repair" (02/20/2012)   MASS EXCISION Left 08/28/2021   Procedure: EXCISION OF SOFT TISSUE  MASS LEFT FOOT;  Surgeon: Felipa Furnace, DPM;  Location: [facility];  Service: Podiatry;  Laterality: Left;   NASAL SEPTUM SURGERY  1980's   POSTERIOR CERVICAL FUSION/FORAMINOTOMY  2004   "failed initial fusion; rewired  anterior neck" (02/20/2012)   REVERSE SHOULDER ARTHROPLASTY Right 02/16/2020   Procedure: REVERSE SHOULDER ARTHROPLASTY;  Surgeon: Marchia Bond, MD;  [facility]: [facility];  Service: Orthopedics;  Laterality: Right;   RIGHT/LEFT HEART CATH AND CORONARY ANGIOGRAPHY N/A 01/11/2021   Procedure: RIGHT/LEFT HEART CATH AND CORONARY ANGIOGRAPHY;  Surgeon: Leonie Man, MD;  Location: [facility];  Service: Cardiovascular;  Laterality: N/A;   SPINE SURGERY N/A    Phreesia 10/21/2019   TONSILLECTOMY  ~ [place]  VIDEO BRONCHOSCOPY Bilateral 08/23/2016   Procedure: VIDEO BRONCHOSCOPY WITH FLUORO;  Surgeon: Javier Glazier, MD;  Location: [facility];  Service: Cardiopulmonary;  Laterality: Bilateral;   VIDEO BRONCHOSCOPY N/A 04/05/2020   Procedure: VIDEO BRONCHOSCOPY WITHOUT FLUORO;  Surgeon: Freddi Starr, MD;  [facility];  Service: Pulmonary;  Laterality: N/A;   Social History   Social History Narrative   Patient lives at home alone. Patient  divorced.    Patient has her BS degree.   Right handed.    Caffeine- sometimes coffee.       Pulmonary:   Born in [place], [place]. She worked as a Copywriter, advertising. She has no pets currently. She does have indoor plants. Previously had mold in her home that was remediated. Carpet was removed.          family history includes Allergies in her father and mother; Arthritis in her father and mother; Colitis in her daughter; Colon cancer in an other family member; Diabetes in her maternal grandfather and mother; Heart disease in her brother, father, and mother; Lung cancer in her mother; Stroke in her father.   Review of Systems  As per HPI Objective:   Physical Exam BP 130/68 (BP Location: Right Arm)   Pulse 70   Ht 5' 1"$  (1.549 m)   Wt 137 lb (62.1 kg)   BMI 25.89 kg/m  Chronically ill white woman, she moved to the exam table slowly and requires slight assistance on and off. Abdomen is soft and nontender bowel sounds are present, perhaps slightly tympanitic in the upper abdomen but benign overall.

## 2022-04-06 NOTE — Patient Instructions (Signed)
_______________________________________________________  If your blood pressure at your visit was 140/90 or greater, please contact your primary care physician to follow up on this.  _______________________________________________________  If you are age 74 or older, your body mass index should be between 23-30. Your Body mass index is 25.89 kg/m. If this is out of the aforementioned range listed, please consider follow up with your Primary Care Provider.  If you are age 36 or younger, your body mass index should be between 19-25. Your Body mass index is 25.89 kg/m. If this is out of the aformentioned range listed, please consider follow up with your Primary Care Provider.   ________________________________________________________  The [facility] providers would like to encourage you to use [facility] to communicate with providers for non-urgent requests or questions.  Due to long hold times on the telephone, sending your provider a message by [facility] may be a faster and more efficient way to get a response.  Please allow 48 business hours for a response.  Please remember that this is for non-urgent requests.  _______________________________________________________  We have sent the following medications to your pharmacy for you to pick up at your convenience: Flagyl  I appreciate the opportunity to care for you. Silvano Rusk, MD, [facility]

## 2022-04-10 DIAGNOSIS — L299 Pruritus, unspecified: Secondary | ICD-10-CM | POA: Insufficient documentation

## 2022-04-11 ENCOUNTER — Telehealth: Payer: Self-pay

## 2022-04-11 ENCOUNTER — Other Ambulatory Visit: Payer: Self-pay

## 2022-04-11 ENCOUNTER — Telehealth: Payer: Self-pay | Admitting: Allergy and Immunology

## 2022-04-11 ENCOUNTER — Other Ambulatory Visit: Payer: Self-pay | Admitting: Family Medicine

## 2022-04-11 DIAGNOSIS — I5020 Unspecified systolic (congestive) heart failure: Secondary | ICD-10-CM

## 2022-04-11 DIAGNOSIS — J4550 Severe persistent asthma, uncomplicated: Secondary | ICD-10-CM

## 2022-04-11 DIAGNOSIS — N1832 Chronic kidney disease, stage 3b: Secondary | ICD-10-CM

## 2022-04-11 DIAGNOSIS — E242 Drug-induced Cushing's syndrome: Secondary | ICD-10-CM

## 2022-04-11 DIAGNOSIS — D649 Anemia, unspecified: Secondary | ICD-10-CM

## 2022-04-11 DIAGNOSIS — E1169 Type 2 diabetes mellitus with other specified complication: Secondary | ICD-10-CM

## 2022-04-11 MED ORDER — MONTELUKAST SODIUM 10 MG PO TABS: 10.0000 mg | ORAL_TABLET | Freq: Every day | ORAL | 1 refills | Status: DC

## 2022-04-11 MED ORDER — EMPAGLIFLOZIN 10 MG PO TABS: 10.0000 mg | ORAL_TABLET | Freq: Every day | ORAL | 1 refills | Status: DC

## 2022-04-11 MED ORDER — ISOSORB DINITRATE-HYDRALAZINE 20-37.5 MG PO TABS: 1.0000 | ORAL_TABLET | Freq: Two times a day (BID) | ORAL | 1 refills | Status: DC

## 2022-04-11 NOTE — Telephone Encounter (Signed)
Patient called and needs to have Famotidine 51m, and Cyproheptadine 474m 90 day supply called into [facility]. [phone]

## 2022-04-11 NOTE — Telephone Encounter (Signed)
Pt called and asks if it is possible to get a 90 day supply of her Tramadol? Pt states it will be cheaper through her insurance. Thank you.

## 2022-04-11 NOTE — Telephone Encounter (Signed)
Pt needs office visit before any more refills are given

## 2022-04-11 NOTE — Telephone Encounter (Signed)
Requested Prescriptions  Pending Prescriptions Disp Refills   isosorbide-hydrALAZINE (BIDIL) 20-37.5 MG tablet [Pharmacy Med Name: isosorbide 20 mg-hydralazine 37.5 mg tablet] 60 tablet 1    Sig: TAKE 1 TABLET BY MOUTH TWICE DAILY     Cardiovascular:  Vasodilators Failed - 04/11/2022 11:58 AM      Failed - RBC in normal range and within 360 days    RBC  Date Value Ref Range Status  03/07/2022 3.37 (L) 3.80 - 5.10 Million/uL Final         Failed - WBC in normal range and within 360 days    WBC  Date Value Ref Range Status  03/07/2022 10.9 (H) 3.8 - 10.8 Thousand/uL Final         Failed - ANA Screen, Ifa, Serum in normal range and within 360 days    Anti Nuclear Antibody(ANA)  Date Value Ref Range Status  02/07/2018 NEGATIVE NEGATIVE Final    Comment:    ANA IFA is a first line screen for detecting the presence of up to approximately 150 autoantibodies in various autoimmune diseases. A negative ANA IFA result suggests an ANA-associated autoimmune disease is not present at this time, but is not definitive. If there is high clinical suspicion for Sjogren's syndrome, testing for anti-SS-A/Ro antibody should be considered. Anti-Jo-1 antibody should be considered for clinically suspected inflammatory myopathies. . AC-0: Negative . International Consensus on ANA Patterns (https://www.hernandez-brewer.com/) . For additional information, please refer to http://education.[employer].com/faq/FAQ177 (This link is being provided for informational/ educational purposes only.) .          Passed - HCT in normal range and within 360 days    HCT  Date Value Ref Range Status  03/07/2022 35.4 35.0 - 45.0 % Final   Hematocrit  Date Value Ref Range Status  02/03/2021 20.6 (L) 34.0 - 46.6 % Final         Passed - HGB in normal range and within 360 days    Hemoglobin  Date Value Ref Range Status  03/07/2022 11.9 11.7 - 15.5 g/dL Final  12/21/2021 10.4 (L) 12.0 - 15.0 g/dL  Final  02/03/2021 6.4 (LL) 11.1 - 15.9 g/dL Final         Passed - PLT in normal range and within 360 days    Platelets  Date Value Ref Range Status  03/07/2022 259 140 - 400 Thousand/uL Final  02/03/2021 306 150 - 450 x10E3/uL Final   Platelet Count  Date Value Ref Range Status  12/21/2021 204 150 - 400 K/uL Final         Passed - Last BP in normal range    BP Readings from Last 1 Encounters:  04/06/22 130/68         Passed - Valid encounter within last 12 months    Recent Outpatient Visits           10 months ago Chronic fatigue   [place] [facility] Schaumann, Cammie Mcgee, MD   1 year ago Anemia, unspecified type   [place] [name], Cammie Mcgee, MD   1 year ago Anemia, unspecified type   [place] [name], MD   1 year ago Type 2 diabetes mellitus with other specified complication, unspecified whether long term insulin use ([place])   [place] [name], MD   1 year ago Chronic combined systolic and diastolic CHF (congestive heart failure) ([name])   [facility] [name], Cammie Mcgee, MD  Future Appointments             In 1 week Buford Dresser, MD [facility] [place]   In 2 weeks Rice, Resa Miner, MD [facility]            ]

## 2022-04-12 ENCOUNTER — Telehealth: Payer: Self-pay | Admitting: Pharmacist

## 2022-04-12 ENCOUNTER — Other Ambulatory Visit: Payer: Self-pay | Admitting: Family Medicine

## 2022-04-12 DIAGNOSIS — M1120 Other chondrocalcinosis, unspecified site: Secondary | ICD-10-CM

## 2022-04-12 DIAGNOSIS — M19011 Primary osteoarthritis, right shoulder: Secondary | ICD-10-CM

## 2022-04-12 MED ORDER — TRAMADOL HCL 50 MG PO TABS: 50.0000 mg | ORAL_TABLET | Freq: Every day | ORAL | 0 refills | Status: DC

## 2022-04-12 NOTE — Progress Notes (Signed)
Care Management & Coordination Services Pharmacy Team  Reason for Encounter: Appointment Reminder  Contacted patient to confirm telephone appointment with Ebony Scott, PharmD on 04/16/2022 at 53 am. Spoke with patient on 04/13/2022   Do you have any problems getting your medications? No If yes what types of problems are you experiencing? Patient does not drive so she has to depend on others to pick up her medications for her.  What is your top health concern you would like to discuss at your upcoming visit? "My breathing."  Have you seen any other providers since your last visit with PCP? No   Chart review:  Recent office visits:  11/24/2021 VV (Fam Med) Fenton Malling M, PA-C; - Add azithromycin - Add Budesonide nebulizer treatments in the morning to Duoneb nebulizer (using Duoneb BID already)  Recent consult visits:  04/10/2022 OV (Otolaryngology) Spainhour, Ann Lions, PA-C; Trial of Derm otic oil eardrops   04/06/2022 OV Gertie Fey) Gatha Mayer, MD; Rx flagyl  03/19/2022 OV (Pulmonology) Freddi Starr, MD; Plan: doxycycline (VIBRA-TABS) 100 MG tablet, Pulmonary Function Test   03/07/2022 OV (Podiatry) Felipa Furnace, DPM; ammonium lactate was sent to the pharmacy.  03/07/2022 OV (Rheumatology) Collier [place], MD;  Will plan to continue the prednisone 7 mg daily and recommend she add hydroxychloroquine 200 mg daily   01/08/2022 OV Gertie Fey) Gatha Mayer, MD; Samples of Xifaxan 550 mg 3 times daily for 2 weeks provided.   12/21/2021 OV (Oncology) Orson Slick, MD; no medication changes indicated.  11/29/2021 OV (Rheumatology) Collier [place], MD; Previously tolerated gabapentin 600 mg TID this was reduced due to concern of dizziness or drowsiness side effect. Recommend trying to increase to 300 mg dose for now try increase at night first and monitor for side effects.   11/14/2021 OV (Pulmonology) Freddi Starr, MD; Yeast infection - Plan: fluconazole  (DIFLUCAN) 100 MG tablet.  11/13/2021 OV (Cardiology) Buford Dresser, MD;  no medication changes indicated.  Hospital visits:  None in previous 6 months   Star Rating Drugs:  Atorvastatin 80 mg last filled 02/15/2022 90 DS Jardiance 10 mg last filled 03/28/2022 30 DS Valsartan 40 mg last filled 04/04/2022 90 DS   Care Gaps: Annual wellness visit in last year? Yes  If Diabetic: Last eye exam / retinopathy screening:03/07/2022 Last diabetic foot exam: Overdue - never done   Future Appointments  Date Time Provider [place]  04/16/2022 11:00 AM Edythe Clarity, [facility] None  04/16/2022  1:40 PM GI-315 CT 1 GI-315CT [facility]  04/24/2022 11:00 AM Buford Dresser, MD [facility]  04/25/2022 11:00 AM Collier [place], MD [facility] None  06/14/2022 10:00 AM [facility] None  06/14/2022 10:40 AM Orson Slick, MD [facility] None  07/19/2022  3:00 PM [facility]-NURSE HEALTH ADVISOR [facility]

## 2022-04-16 ENCOUNTER — Ambulatory Visit: Payer: Medicare PPO | Admitting: Pharmacist

## 2022-04-16 ENCOUNTER — Ambulatory Visit
Admission: RE | Admit: 2022-04-16 | Discharge: 2022-04-16 | Disposition: A | Discharge status: Home / Self Care | Payer: Medicare PPO | Source: Ambulatory Visit | Attending: Pulmonary Disease | Admitting: Pulmonary Disease

## 2022-04-16 DIAGNOSIS — J479 Bronchiectasis, uncomplicated: Secondary | ICD-10-CM

## 2022-04-16 DIAGNOSIS — R053 Chronic cough: Secondary | ICD-10-CM

## 2022-04-16 DIAGNOSIS — M06049 Rheumatoid arthritis without rheumatoid factor, unspecified hand: Secondary | ICD-10-CM

## 2022-04-16 LAB — HM MAMMOGRAPHY

## 2022-04-16 NOTE — Progress Notes (Unsigned)
Care Management & Coordination Services Pharmacy Note  04/18/2022 Name:  Ebony Scott MRN:  [id number] DOB:  May 28, 1950  Summary: PharmD initial visit.  All medications reviewed.  Main issue today is her shortness of breath.  Hard to differentiate between CHF and COPD symptoms.  States fluids are controlled, using Stiolto daily but no her pulmicort nebulizer.  Also reviewed home BP which seems elevated will have her monitor.  Recommendations/Changes made from today's visit: Monitor BP - may need to titrate valsartan pending kidney function Monitor SOB - consider Breztri if still symptomatic  Follow up plan: Fu 3 months   Subjective: HESTA Scott is an 72 y.o. year old female who is a primary patient of [name], Cammie Mcgee, MD.  The care coordination team was consulted for assistance with disease management and care coordination needs.    Engaged with patient by telephone for initial visit.  Recent office visits:  11/24/2021 VV (Fam Med) Fenton Malling M, PA-C; - Add azithromycin - Add Budesonide nebulizer treatments in the morning to Duoneb nebulizer (using Duoneb BID already)   Recent consult visits:  04/10/2022 OV (Otolaryngology) Spainhour, Ann Lions, PA-C; Trial of Derm otic oil eardrops    04/06/2022 OV Gertie Fey) Gatha Mayer, MD; Rx flagyl   03/19/2022 OV (Pulmonology) Freddi Starr, MD; Plan: doxycycline (VIBRA-TABS) 100 MG tablet, Pulmonary Function Test    03/07/2022 OV (Podiatry) Felipa Furnace, DPM; ammonium lactate was sent to the pharmacy.   03/07/2022 OV (Rheumatology) Collier Salina, MD;  Will plan to continue the prednisone 7 mg daily and recommend she add hydroxychloroquine 200 mg daily    01/08/2022 OV Gertie Fey) Gatha Mayer, MD; Samples of Xifaxan 550 mg 3 times daily for 2 weeks provided.    12/21/2021 OV (Oncology) Orson Slick, MD; no medication changes indicated.   11/29/2021 OV (Rheumatology) Collier Salina, MD; Previously  tolerated gabapentin 600 mg TID this was reduced due to concern of dizziness or drowsiness side effect. Recommend trying to increase to 300 mg dose for now try increase at night first and monitor for side effects.    11/14/2021 OV (Pulmonology) Freddi Starr, MD; Yeast infection - Plan: fluconazole (DIFLUCAN) 100 MG tablet.   11/13/2021 OV (Cardiology) Buford Dresser, MD;  no medication changes indicated.   Hospital visits:  None in previous 6 months   Objective:  Lab Results  Component Value Date   CREATININE 1.68 (H) 03/07/2022   BUN 32 (H) 03/07/2022   GFR 38.39 (L) 05/03/2021   EGFR 32 (L) 03/07/2022   GFRNONAA 27 (L) 12/21/2021   GFRAA 40 (L) 08/25/2020   NA 143 03/07/2022   K 4.8 03/07/2022   CALCIUM 9.9 03/07/2022   CO2 27 03/07/2022   GLUCOSE 133 (H) 03/07/2022    Lab Results  Component Value Date/Time   HGBA1C 6.3 (H) 08/14/2021 11:56 AM   HGBA1C 5.5 02/05/2021 04:30 AM   GFR 38.39 (L) 05/03/2021 10:27 AM   GFR 35.53 (L) 04/29/2020 11:58 AM    Last diabetic Eye exam:  Lab Results  Component Value Date/Time   HMDIABEYEEXA No Retinopathy 03/07/2022 12:00 AM    Last diabetic Foot exam: No results found for: "HMDIABFOOTEX"   Lab Results  Component Value Date   CHOL 162 12/21/2020   HDL 64 12/21/2020   LDLCALC 64 12/21/2020   LDLDIRECT 141.2 (H) 09/23/2020   TRIG 210 (H) 12/21/2020   CHOLHDL 2.5 12/21/2020       Latest Ref Rng &  Units 12/21/2021   10:31 AM 09/19/2021   10:18 AM 08/14/2021   11:56 AM  Hepatic Function  Total Protein 6.5 - 8.1 g/dL 7.3  7.2  6.3   Albumin 3.5 - 5.0 g/dL 3.7  3.8    AST 15 - 41 U/L 49  40  59   ALT 0 - 44 U/L 57  38  73   Alk Phosphatase 38 - 126 U/L 60  49    Total Bilirubin 0.3 - 1.2 mg/dL 0.5  0.5  0.6     Lab Results  Component Value Date/Time   TSH 1.161 05/24/2021 02:59 PM   TSH 0.983 09/23/2020 12:53 PM   TSH 1.50 03/27/2018 10:16 AM   TSH 18.10 (H) 12/02/2017 08:09 AM   FREET4 1.08 03/06/2018  04:27 PM   FREET4 1.02 02/21/2012 04:26 PM       Latest Ref Rng & Units 03/07/2022   12:23 PM 12/21/2021   10:31 AM 10/19/2021    7:39 AM  CBC  WBC 3.8 - 10.8 Thousand/uL 10.9  13.1  8.0   Hemoglobin 11.7 - 15.5 g/dL 11.9  10.4  10.2   Hematocrit 35.0 - 45.0 % 35.4  31.6  32.2   Platelets 140 - 400 Thousand/uL 259  204  261     Lab Results  Component Value Date/Time   VD25OH 55 07/30/2019 10:06 AM   VD25OH 53 05/07/2014 10:11 AM   VITAMINB12 549 09/19/2021 12:15 PM   VITAMINB12 1,486 (H) 09/26/2020 08:48 AM    Clinical ASCVD: No  The 10-year ASCVD risk score (Arnett DK, et al., 2019) is: 33.5%   Values used to calculate the score:     Age: 72 years     Sex: Female     Is Non-Hispanic African American: No     Diabetic: Yes     Tobacco smoker: No     Systolic Blood Pressure: [id number]     Is BP treated: Yes     HDL Cholesterol: 64 mg/dL     Total Cholesterol: 162 mg/dL        07/13/2021    3:22 PM 08/05/2020    3:09 PM 07/07/2020   10:55 AM  Depression screen PHQ 2/9  Decreased Interest 0 0 0  Down, Depressed, Hopeless 0 0 0  PHQ - 2 Score 0 0 0     Social History   Tobacco Use  Smoking Status Never   Passive exposure: Past  Smokeless Tobacco Never  Tobacco Comments   Parents   BP Readings from Last 3 Encounters:  04/06/22 130/68  03/19/22 120/60  03/07/22 138/80   Pulse Readings from Last 3 Encounters:  04/06/22 70  03/19/22 72  03/07/22 72   Wt Readings from Last 3 Encounters:  04/06/22 137 lb (62.1 kg)  03/19/22 137 lb (62.1 kg)  03/07/22 137 lb (62.1 kg)   BMI Readings from Last 3 Encounters:  04/06/22 25.89 kg/m  03/19/22 25.89 kg/m  03/07/22 25.89 kg/m    Allergies  Allergen Reactions   Baclofen Other (See Comments)    Altered mental status requiring a 3-day hospital stay- patient became unresponsive   Dust Mite Extract Shortness Of Breath and Other (See Comments)    "sneezing" (02/20/2012)   Molds & Smuts Shortness Of Breath    Morphine And Related Hives and Itching   Other Shortness Of Breath and Other (See Comments)    Grass and weeds "sneezing; filled sinuses" (02/20/2012)   Penicillins Rash and  Other (See Comments)    "welts" (02/20/2012) Tolerated Ancef 02/16/20.    Rofecoxib Swelling and Other (See Comments)    Vioxx- feet swelling    Shellfish Allergy Anaphylaxis, Shortness Of Breath, Itching and Rash   Tetracycline Hcl Nausea And Vomiting   Xolair [Omalizumab] Other (See Comments)    Caused Blood clot   Zoledronic Acid Other (See Comments)    Fever, Put in hospital, dr said it was a reaction from a reaction  Other reaction(s): Unknown   Dilaudid [Hydromorphone Hcl] Itching   Levofloxacin Other (See Comments)    GI upset   Oxycodone Hcl Nausea And Vomiting   Paroxetine Nausea And Vomiting and Other (See Comments)    Paxil    Celecoxib Swelling and Other (See Comments)    Feet swelling   Diltiazem Hcl Swelling   Hydrocodone-Acetaminophen Itching   Lactose Intolerance (Gi) Other (See Comments)    Bloating and gas   Rituximab     Anemia    Tree Extract Other (See Comments)    "tested and told I was allergic to it; never experienced a reaction to it" (02/20/2012)   Gamunex [Immune Globulin] Itching and Rash    Tolerates hizentra    Penicillin G Procaine Rash and Other (See Comments)    Welts, also    Medications Reviewed Today     Reviewed by Edythe Clarity, [facility] (Pharmacist) on 04/18/22 at 1540  Med List Status: <None>   Medication Order Taking? Sig Documenting Provider Last Dose Status Informant  acetaminophen (TYLENOL) 650 MG CR tablet [id number] Yes Take 1,300 mg by mouth every 8 (eight) hours as needed for pain. [provider] Taking Active Self  albuterol (VENTOLIN HFA) 108 (90 Base) MCG/ACT inhaler [id number] Yes Inhale 2 puffs into the lungs every 6 (six) hours as needed for wheezing or shortness of breath. Jiles Prows, MD Taking Active Self  allopurinol (ZYLOPRIM) 100 MG  tablet [id number] Yes TAKE 1 TABLET BY MOUTH DAILY [name], MD Taking Active   Alpha-D-Galactosidase [facility] PO) [id number] Yes Take 2 tablets by mouth 3 (three) times daily with meals. [provider] Taking Active Self  Alpha-Lipoic Acid 600 MG TABS [id number] Yes Take 600 mg by mouth daily. [provider] Taking Active Self  ammonium lactate (AMLACTIN DAILY) 12 % lotion [id number] Yes Apply 1 Application topically as needed for dry skin. Felipa Furnace, DPM Taking Active   atorvastatin (LIPITOR) 80 MG tablet [id number] Yes TAKE 1 TABLET BY MOUTH DAILY [name], MD Taking Active   azelastine (ASTELIN) 0.1 % nasal spray [id number] Yes Place 2 sprays into both nostrils daily. Use in each nostril as directed Kozlow, Donnamarie Poag, MD Taking Active Self  benzonatate (TESSALON) 200 MG capsule [id number] Yes TAKE 1 CAPSULE BY MOUTH THREE TIMES DAILY AS NEEDED FOR COUGH [name], MD Taking Active Self  budesonide (PULMICORT) 1 MG/2ML nebulizer solution [id number] Yes Take 2 mLs (1 mg total) by nebulization daily. Mar Daring, PA-C Taking Active   Calcium-Magnesium-Vitamin D (CALCIUM MAGNESIUM PO) [id number] Yes Take 1 tablet by mouth daily. [provider] Taking Active Self  cetirizine (ZYRTEC) 10 MG tablet [id number] Yes Take 1 tablet (10 mg total) by mouth 2 (two) times daily as needed for allergies (Can take a n extra dose during flare ups.).  Patient taking differently: Take 10 mg by mouth every evening.   Kozlow, Donnamarie Poag, MD Taking Active Self  cholecalciferol (VITAMIN D) 25 MCG (1000 UNIT) tablet  [id number] Yes Take 1,000 Units by mouth every evening. [provider] Taking Active Self  clopidogrel (PLAVIX) 75 MG tablet [id number] Yes Take 1 tablet (75 mg total) by mouth daily. Loel Dubonnet, NP Taking Active Self  cyproheptadine (PERIACTIN) 4 MG tablet [id number] Yes Take 2 tablets (8 mg total) by mouth at bedtime. Take 2 tablets at bedtime as  needed to treat facial pain or sleep dysfunction.  Patient taking differently: Take 8 mg by mouth at bedtime.   Kozlow, Donnamarie Poag, MD Taking Active Self  denosumab (PROLIA) 60 MG/ML SOSY injection [id number] Yes Inject 60 mg into the skin every 6 (six) months. [provider] Taking Active Self  dexlansoprazole (DEXILANT) 60 MG capsule [id number] Yes Take 1 capsule (60 mg total) by mouth daily. Kozlow, Donnamarie Poag, MD Taking Active Self  diclofenac Sodium (VOLTAREN) 1 % GEL [id number] Yes Apply 1 Application topically 4 (four) times daily as needed (pain). [name], MD Taking Active   empagliflozin (JARDIANCE) 10 MG TABS tablet [id number] Yes Take 1 tablet (10 mg total) by mouth daily before breakfast. [name], MD Taking Active   EPINEPHrine 0.3 mg/0.3 mL IJ SOAJ injection [id number] Yes USE AS DIRECTED BY YOUR PHYSICIAN INTRAMUSCULARLY AS NEEDED FOR ANAPHYLAXIS Kozlow, Donnamarie Poag, MD Taking Active   famotidine (PEPCID) 20 MG tablet [id number] Yes TAKE 1 TABLET BY MOUTH EACH NIGHT AT BEDTIME Kozlow, Donnamarie Poag, MD Taking Active   fluticasone (FLONASE) 50 MCG/ACT nasal spray [id number] Yes USE 2 SPRAYS IN EACH NOSTRIL DAILY  Patient taking differently: as needed.   Freddi Starr, MD Taking Active Self  folic acid (FOLVITE) 1 MG tablet [id number] Yes Take 1 tablet (1 mg total) by mouth daily. [name], MD Taking Active   furosemide (LASIX) 40 MG tablet [id number] Yes Take 1 tablet (40 mg total) by mouth daily. Buford Dresser, MD Taking Active   gabapentin (NEURONTIN) 300 MG capsule [id number] Yes TAKE 1 CAPSULE BY MOUTH 3 TIMES DAILY AS NEEDED Rice, Resa Miner, MD Taking Active   guaiFENesin (MUCINEX) 600 MG 12 hr tablet [id number] Yes Take 1 tablet (600 mg total) by mouth 2 (two) times daily as needed for cough or to loosen phlegm.  Patient taking differently: Take 600 mg by mouth 2 (two) times daily.   [name], MD Taking Active Self  HIZENTRA 1 GM/5ML Bailey Mech  [id number] Yes Hizentra 3 times/5 ml = 65 ml total once weekly on Fridays [provider] Taking Active Self           Med Note Jilda Roche A   Thu Aug 17, 2021  3:31 PM) Fridays  hydroxychloroquine (PLAQUENIL) 200 MG tablet [id number] Yes Take 1 tablet (200 mg total) by mouth daily. Collier Salina, MD Taking Active   ipratropium-albuterol (DUONEB) 0.5-2.5 (3) MG/3ML SOLN [id number] Yes Take 3 mLs by nebulization every 6 (six) hours as needed. Freddi Starr, MD Taking Active   isosorbide-hydrALAZINE (BIDIL) 20-37.5 MG tablet [id number] Yes Take 1 tablet by mouth 2 (two) times daily. [name], MD Taking Active   Lactase (LACTOSE FAST ACTING RELIEF PO) [id number] Yes Take 3 tablets by mouth 3 (three) times daily. [provider] Taking Active Self  lidocaine-prilocaine (EMLA) cream [id number] Yes Apply 1 application  topically as needed (pain). [provider] Taking Active Self  magnesium oxide (MAG-OX) 400 (240 Mg) MG tablet [id number] Yes Take 400 mg by mouth daily. [provider] Taking Active Self  Menthol, Topical Analgesic, (  BIOFREEZE) 4 % GEL [id number] Yes Apply 1 application. topically daily as needed (pain). [provider] Taking Active Self  metroNIDAZOLE (FLAGYL) 250 MG tablet [id number] Yes Take 1 tablet (250 mg total) by mouth 3 (three) times daily. Gatha Mayer, MD Taking Active   mirtazapine (REMERON SOL-TAB) 30 MG disintegrating tablet [id number] Yes Take 1 tablet (30 mg total) by mouth at bedtime as needed (sleep). [name], MD Taking Active   montelukast (SINGULAIR) 10 MG tablet [id number] Yes Take 1 tablet (10 mg total) by mouth daily. [name], MD Taking Active   Nystatin (GERHARDT'S BUTT CREAM) CREA [id number] Yes Apply topically 3 times a day as needed for itching or irritation [name], MD Taking Active Self  nystatin (MYCOSTATIN) 100000 UNIT/ML suspension [id number] Yes Use as directed 15 mLs in  the mouth or throat at bedtime as needed (mouth sores). [provider] Taking Active Self  nystatin-triamcinolone ointment Lilyan Gilford) [id number] Yes Apply 1 Application topically 2 (two) times daily as needed (vaginal irritation). [provider] Taking Active Self  potassium chloride (KLOR-CON M) 10 MEQ tablet [id number] Yes Take 1 tablet (10 mEq total) by mouth daily. Orson Slick, MD Taking Active   predniSONE (DELTASONE) 1 MG tablet [id number] Yes Take 2 tablets (2 mg total) by mouth daily. Total of 7 mg daily. Collier Salina, MD Taking Active   predniSONE (DELTASONE) 5 MG tablet [id number] Yes Take 1 tablet (5 mg total) by mouth daily with breakfast. Along with 2 mg tablets for total daily dose 7 mg total daily Rice, Resa Miner, MD Taking Active   Prenatal Vit-Fe Fumarate-FA (PRENATAL VITAMINS PO) [id number] Yes Take 1 tablet by mouth daily. [provider] Taking Active Self  Probiotic Product (ALIGN PO) [id number] Yes Take 1 capsule by mouth daily. [provider] Taking Active Self  Propylene Glycol (SYSTANE COMPLETE) 0.6 % SOLN [id number] Yes Place 1 drop into both eyes 2 (two) times daily. Kozlow, Donnamarie Poag, MD Taking Active Self  Tiotropium Bromide-Olodaterol (STIOLTO RESPIMAT) 2.5-2.5 MCG/ACT AERS [id number] Yes Inhale 2 puffs into the lungs daily.  Patient taking differently: Inhale 2 puffs into the lungs every evening.   Freddi Starr, MD Taking Active Self  traMADol Veatrice Bourbon) 50 MG tablet [id number] Yes Take 1 tablet (50 mg total) by mouth at bedtime. [name], MD Taking Active   valsartan (DIOVAN) 40 MG tablet [id number] Yes Take 1 tablet (40 mg total) by mouth daily. Orson Slick, MD Taking Active   XARELTO 20 MG TABS tablet [id number] Yes TAKE 1 TABLET BY MOUTH DAILY WITH SUPPER [name], MD Taking Active   Med List Note Bridgette Habermann, CPhT 01/04/21 1640):              SDOH:  (Social Determinants of Health)  assessments and interventions performed: Yes Financial Resource Strain: Low Risk  (04/18/2022)   Overall Financial Resource Strain (CARDIA)    Difficulty of Paying Living Expenses: Not hard at all   Food Insecurity: No Food Insecurity (04/18/2022)   Hunger Vital Sign    Worried About Running Out of Food in the Last Year: Never true    Ran Out of Food in the Last Year: Never true    SDOH Interventions    Flowsheet Row Clinical Support from 07/13/2021 in [place] from 07/07/2020 in [place] Interventions    Food Insecurity Interventions Intervention Not Indicated Intervention Not  Indicated  Housing Interventions Intervention Not Indicated Intervention Not Indicated  Transportation Interventions Intervention Not Indicated Intervention Not Indicated  Financial Strain Interventions -- Intervention Not Indicated  Physical Activity Interventions Other (Comments) Intervention Not Indicated  Stress Interventions Intervention Not Indicated Intervention Not Indicated  Social Connections Interventions Intervention Not Indicated Intervention Not Indicated       Medication Assistance: None required.  Patient affirms current coverage meets needs.  Medication Access: Within the past 30 days, how often has patient missed a dose of medication? 0 Is a pillbox or other method used to improve adherence? Yes  Factors that may affect medication adherence? no barriers identified Are meds synced by current pharmacy? No  Are meds delivered by current pharmacy? No  Does patient experience delays in picking up medications due to transportation concerns? No   [facility] Services Reviewed: Is patient disadvantaged to use [facility]?: No  Current Rx insurance plan: [employer] Name and location of Current pharmacy:  [place], [place]  [address] Phone: [phone] Fax: [phone]  [facility] services reviewed with patient today?: Yes  Patient requests to transfer care to [facility]?: No  Reason patient declined to change pharmacies: Loyalty to other pharmacy/Patient preference  Compliance/Adherence/Medication fill history: Star Rating Drugs:  Atorvastatin 80 mg last filled 02/15/2022 90 DS Jardiance 10 mg last filled 03/28/2022 30 DS Valsartan 40 mg last filled 04/04/2022 90 DS     Care Gaps: Annual wellness visit in last year? Yes   If Diabetic: Last eye exam / retinopathy screening:03/07/2022 Last diabetic foot exam: Overdue - never done     Assessment/Plan  Hypertension? (BP goal <130/80) -Uncontrolled -Current treatment: Valsartan '40mg'$  Appropriate, Query effective, ,  Isosorbide-Hydralazine 20-37.'5mg'$  daily Appropriate, Query effective, ,  -Medications previously tried: bisoprolol -Current home readings: 148/69, 155/109, 128/57, 136/109, 131/66, 146/59, 139/67, 148/68 -Denies hypotensive/hypertensive symptoms -Educated on BP goals and benefits of medications for prevention of heart attack, stroke and kidney damage; Daily salt intake goal < 2300 mg; Importance of home blood pressure monitoring; -Counseled to monitor BP at home as current, document, and provide log at future appointments -Recommended to continue current medication Contact me with consistently high BP's keep monitoring at home.  Based on home BP she may need to increase valsartan to '80mg'$  daily.  Will have her check and record and I will call her in 2-4 weeks to assess home BP and make adjustments.  Hyperlipidemia: (LDL goal < 70) -Controlled, most recent LDL at goal -Current treatment: Atorvastatin '80mg'$  Appropriate, Effective, Safe, Accessible -Medications previously tried: none noted   -Educated on Cholesterol goals;  Benefits of statin for ASCVD risk reduction; Importance of limiting foods high in  cholesterol; -Recommended to continue current medication -Tolerates medication well and LDL controlled, no need for changes  Diabetes (A1c goal <7%) -Controlled -Current medications: Jardiance '10mg'$  Appropriate, Effective, Safe, Accessible -Medications previously tried:  [place], glipizide -Current home glucose readings fasting glucose: N/A post prandial glucose: N/A -Denies hypoglycemic/hyperglycemic symptoms  -Educated on A1c and blood sugar goals; Complications of diabetes including kidney damage, retinal damage, and cardiovascular disease; -Counseled to check feet daily and get yearly eye exams -Recommended to continue current medication -Well controlled, could always titrate jardiance in the future if we need to.  For now - continue on current dose of Jardiance.  Heart Failure (Goal: manage symptoms and prevent exacerbations) -Controlled -Last ejection fraction: 30-35%  -HF type: HFrEF (EF <  40%)  -Current treatment: Jardiance '10mg'$  Appropriate, Effective, Safe, Accessible Valsartan '40mg'$  Appropriate, Effective, Safe, Accessible Furosemide '40mg'$  Appropriate, Effective, Safe, Accessible -Medications previously tried: none ntoed  -Current home BP/HR readings: BP has been above goal recently t home -Current home daily weights: monitoring at home - weight has been stable  -Educated on Benefits of medications for managing symptoms and prolonging life Importance of weighing daily; if you gain more than 3 pounds in one day or 5 pounds in one week, contact providers -Recommended to continue current medication  COPD (Goal: control symptoms and prevent exacerbations) -Controlled -Current treatment  Albuterol HFA 14mg prn Appropriate, Effective, Safe, Accessible Pulmicort '1mg'$ /242mnebulizer Appropriate, Effective, Safe, Accessible Stiolto Respimat Appropriate, Effective, Safe, Accessible Albuterol Nebulizer solution prn Appropriate, Effective, Safe, Accessible -Medications  previously tried: reports she cannot tolerate steroid component in inhalers   -Pulmonary function testing: Pulmonary Functions Testing Results:  TLC  Date Value Ref Range Status  08/20/2019 3.71 L Final    -Exacerbations requiring treatment in last 6 months: 0 -Patient reports consistent use of maintenance inhaler -Frequency of rescue inhaler use: infrequent -Counseled on Proper inhaler technique; Benefits of consistent maintenance inhaler use When to use rescue inhaler -Recommended to continue current medication -She was not using her Pulmicort nebulizer solution - she thought this was just for a short period of time.  Hard to differentiate COPD symptoms from HF symptoms.  Had her really try to control her fluid.  If still having some SOB - I would recommend possible [facility].   ChBeverly MilchPharmD, CPP Clinical Pharmacist Practitioner [phone]

## 2022-04-24 ENCOUNTER — Ambulatory Visit (HOSPITAL_BASED_OUTPATIENT_CLINIC_OR_DEPARTMENT_OTHER): Payer: Medicare PPO | Admitting: Cardiology

## 2022-04-24 NOTE — Progress Notes (Deleted)
Office Visit Note  Patient: Ebony Scott             Date of Birth: 31-Aug-1950           MRN: [id number]             PCP: [name], MD Referring: [name], MD Visit Date: 04/25/2022   Subjective:  No chief complaint on file.   History of Present Illness: Ebony Scott is a 72 y.o. female here for follow up ***   Previous HPI 03/07/22 Ebony Scott is a 72 y.o. 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 She has increased joint pain in multiple areas especially bilateral hands also left shoulder and right knee. She has seen only a small benefit in symptoms so far after the knee viscosupplementation shots last year. She has pain pretty much all the time both morning and night time. She notices some worsening of lateral deviation in the small finger on her right hand.    Previous HPI 11/29/21 Ebony Scott is a 72 y.o. female here for follow up for seronegative RA and cushing's syndrome currently on long term prednisone. She is on hizentra for hypogammaglobulinemia. Since last visit she had a bone marrow biopsy for macrocytosis with benign findings. She is having an exacerbation of asthma with wheezing and coughing increased since early September and did not improve well with a prolonged steroid taper. Subsequently has not been prescribed azithromycin and higher dose steroid starting about 5 days ago so far still having a lot of coughing and symptoms. She notices increased joint pain in hands and knees during this episode. She completed a series of 3 viscosupplementation injections in the left knee about 2 weeks ago with a partial benefit.   Previous HPI 08/09/2021 Ebony Scott is a 72 y.o. female here for follow up or seronegative RA and cushing's syndrome on prednisone with gradual tapering but increased back to 7 mg daily after last visit.  She is having some increased joint pain at multiple areas but worst problem is increased pain in her left  knee.  This is severe enough to limit her tolerance for weightbearing and mobility.  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  She has not had any severe interval infections.  She is scheduled for hammertoe corrective surgery next month.   Previous HPI 04/28/21 Ebony Scott is a 72 y.o. female here for follow up for seronegative RA and cushing's syndrome on prednisone with gradual tapering from 8 mg in December. She developed worsening iron deficiency anemia after GI bleed getting venofer replacement. Joint and skin problems were remaining pretty stable until about 2 weeks ot a month ago. This was after decreasing prednisone to 6 mg daily dose. Knee pain limits her mobility but hands have the most increase in swelling. Skin rash on the back of her neck and throughout her low back. She tried topical hydrocortisone without a large improvement so far.   Previous HPI 01/23/21 Ebony Scott is a 72 y.o. female here for follow up for seronegative RA minimally responsive to multiple DMARD treatments on prednisone 10 mg PO daily with acquired cushing's syndrome. She has noticed some increase in right hand MCP joint swelling but overall joints not dramatically changed with dose reduction. Since our visit she went for coronary artery stent placement after study for evaluating cardiomyopathy found significant stenosis. No anginal symptoms, she is not  sure if any different but husband reports noticing some improvement in her dyspnea on exertion. She has had numerous other issues increased upper airway congestion and drainage. Sometimes productive coughing with this. She has had mild nasal bleeding with rhinitis and and sneezing issues.   Previous HPI 11/28/20 Ebony Scott is a 72 y.o. female here for iatrogenic cushing syndrome and history of rheumatoid arthritis with long term prednisone treatment, previously seen by Dr. Kathlene November at [facility] for this problem. Mostly had been  taking treatments after hospitalization with severe joint inflammation in 2020. She has tried taking numerous DMARD treatments for years including actemra, simponi, abatacept, and rituximab so far without great response or tolerance. Chronic prednisone required for symptoms throughout this time but has taken these longstanding for asthma disease already. She has had some issue with recurrent infections with pulmonary and septic bursitis and more recently noninfectious allergy symptoms managed with Dr. Neldon Mc and Erin Fulling. She started on IVIG treatments with Dr. Neldon Mc for this identified recently, not sure if primary or related to previous medication   DMARD Hx Rituximab Abatacept Simponi Actemra IV   No Rheumatology ROS completed.   PMFS History:  Patient Active Problem List   Diagnosis Date Noted   Effusion, left knee 08/09/2021   Noninfectious otitis externa of left ear 06/08/2021   Rash and other nonspecific skin eruption 04/28/2021   Iron deficiency anemia 02/24/2021   GAVE (gastric antral vascular ectasia)    Symptomatic anemia 02/04/2021   Anticoagulated    Antiplatelet or antithrombotic long-term use    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[place])  01/11/2021   Coronary artery calcification seen on CAT scan 01/11/2021   ARF (acute renal failure) ([place]) 10/04/2020   Physical deconditioning 09/25/2020   History of DVT (deep vein thrombosis) 09/24/2020   DMII (diabetes mellitus, type 2) (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   Rheumatoid arthritis ([place])    Bilateral impacted cerumen 08/18/2018   Fungal otitis externa 08/18/2018   Cerumen debris on tympanic membrane of right ear 08/18/2018   Asthmatic bronchitis with exacerbation 07/29/2018   Pulmonary embolism ([place]) 07/29/2018   Cellulitis of forearm    Fever of unknown origin (FUO) 02/10/2018   Abdominal pain 02/10/2018   Fracture of base of fifth metatarsal bone with routine healing, left    Closed fracture of fifth metatarsal bone of right foot, initial encounter 01/15/2018   DVT (deep venous thrombosis) ([place]) 01/14/2018   Hypothyroidism 01/14/2018   Depression 01/14/2018   Debility    Malnutrition of moderate degree 12/17/2017   Knee pain    History of breast cancer    Chronic pain syndrome    Fibromyalgia    Graves disease    Vasculitis ([place]) 12/13/2017   Rhabdomyolysis 12/13/2017   Dysphonia 08/10/2017   Pulmonary nodules    Chronic kidney disease, stage 3b ([place]) 04/15/2016   Hx of adenomatous colonic polyps 04/12/2016   Cough 02/09/2016  Opacity of lung on imaging study 11/03/2015   Severe persistent asthma 02/08/2015   Facial pain syndrome 02/08/2015   Insomnia 02/08/2015   Other allergic rhinitis 02/08/2015   LPRD (laryngopharyngeal reflux disease) 02/08/2015   Chronic combined systolic and diastolic CHF (congestive heart failure) ([place]) 02/08/2015   Benign paroxysmal positional vertigo 12/03/2012   Dizziness and giddiness 10/29/2012   Disequilibrium 10/29/2012   Pseudogout 02/24/2012   Pneumonia 05/31/2011   Irritable bowel syndrome 01/02/2010   Hyperlipidemia 12/23/2009   Hyperthyroidism 11/12/2006   Seasonal and perennial allergic rhinitis 11/12/2006   GERD 11/12/2006   NECK PAIN, CHRONIC 11/12/2006   OSTEOPOROSIS 11/12/2006   BREAST CANCER, HX OF 11/12/2006    Past Medical History:  Diagnosis Date    Abscess of dorsum of right hand 09/22/2020   Acute deep vein thrombosis (DVT) of right lower extremity ([place]) 12/13/2017   Allergic rhinitis    Anemia    Angio-edema    Anxiety    pt denies dx   Arthritis    Phreesia 10/21/2019   Asthma    Phreesia 10/21/2019   Breast cancer ([place]) 1998   Left breast, in remission   Bronchiectasis ([place])    Cataract    REMOVED   CHF (congestive heart failure) ([place])    Clostridium difficile colitis Q000111Q   Complication of anesthesia    "had hard time waking up from it several times" (02/20/2012), no anesthesia problems since 2014   Coronary artery disease    Depression    "some; don't take anything for it" (02/20/2012), pt denies dx as of 06/02/21   Diverticulosis    DVT (deep venous thrombosis) ([facility])    Dyspnea    all the time   Fibromyalgia 11/2011   GAVE (gastric antral vascular ectasia)    GERD (gastroesophageal reflux disease)    Graves disease    Headache(784.0)    "related to allergies; more at different times during the year" (02/20/2012)   Hemorrhoids    Hiatal hernia    back and neck   History of blood transfusion 2023   1 unit, 5 units of Iron   Hx of adenomatous colonic polyps 04/12/2016   Hypercholesteremia    good cholesterol is high   Hypothyroidism    IBS (irritable bowel syndrome)    Moderate persistent asthma    -FeV1 72% 2011, -IgE 102 2011, CT sinus Neg 2011   Osteomyelitis of second toe of right foot ([place]) 09/23/2020   Osteoporosis    on reclast yearly   Peripheral vascular disease ([place]) 2019   DVTs (3) lungs and leg   Pneumonia 04/2011; ~ 11/2011   "double; single" (02/20/2012)   Septic olecranon bursitis of right elbow 09/22/2020   Seronegative rheumatoid arthritis ([place])    Dr. Lahoma Rocker   SIRS (systemic inflammatory response syndrome) ([place]) 02/10/2018   Sleep apnea    uses cpap nightly   Tracheobronchomalacia     Family History  Problem Relation Age of Onset   Allergies Mother    Heart disease  Mother    Arthritis Mother    Lung cancer Mother        heavy smoker   Diabetes Mother    Allergies Father    Heart disease Father    Arthritis Father    Stroke Father    Heart disease Brother    Diabetes Maternal Grandfather    Colitis Daughter    Colon cancer Other        Maternal half uncle  Past Surgical History:  Procedure Laterality Date   ABDOMINAL HYSTERECTOMY N/A    Phreesia 10/21/2019   ANTERIOR AND POSTERIOR REPAIR  1990's   APPENDECTOMY     ARGON LASER APPLICATION  Q000111Q   Procedure: ARGON LASER APPLICATION;  Surgeon: Yetta Flock, MD;  [facility]: [facility];  Service: Gastroenterology;;   BREAST LUMPECTOMY  1998   left   BREAST SURGERY N/A    Phreesia 10/21/2019   BRONCHIAL WASHINGS  04/05/2020   Procedure: BRONCHIAL WASHINGS;  Surgeon: Freddi Starr, MD;  [facility]: [facility];  Service: Pulmonary;;   CAPSULOTOMY Right 08/28/2021   Procedure: CAPSULOTOMY;  Surgeon: Felipa Furnace, DPM;  Location: [facility];  Service: Podiatry;  Laterality: Right;   CARPOMETACARPEL ([place]) FUSION OF THUMB WITH AUTOGRAFT FROM RADIUS  ~ 2009   "both thumbs" (02/20/2012)   CATARACT EXTRACTION W/ INTRAOCULAR LENS  IMPLANT, BILATERAL  2012   CERVICAL DISCECTOMY  10/2001   C5-C6   CERVICAL FUSION  2003   C3-C4   CHOLECYSTECTOMY     COLONOSCOPY     CORONARY STENT INTERVENTION N/A 01/11/2021   Procedure: CORONARY STENT INTERVENTION;  Surgeon: Leonie Man, MD;  Location: [facility];  Service: Cardiovascular;  Laterality: N/A;   DEBRIDEMENT TENNIS ELBOW  ?1970's   right   ESOPHAGOGASTRODUODENOSCOPY     ESOPHAGOGASTRODUODENOSCOPY (EGD) WITH PROPOFOL N/A 02/05/2021   Procedure: ESOPHAGOGASTRODUODENOSCOPY (EGD) WITH PROPOFOL;  Surgeon: Yetta Flock, MD;  [facility];  Service: Gastroenterology;  Laterality: N/A;   EYE SURGERY N/A    Phreesia 10/21/2019   HAMMER TOE SURGERY Right 08/28/2021   Procedure: HAMMER TOE REPAIR 2ND TOE RIGHT FOOT;   Surgeon: Felipa Furnace, DPM;  Location: [facility];  Service: Podiatry;  Laterality: Right;   HYSTERECTOMY     I & D EXTREMITY Right 09/22/2020   Procedure: IRRIGATION AND DEBRIDEMENT RIGHT HAND AND ELBOW;  Surgeon: Marchia Bond, MD;  [facility];  Service: Orthopedics;  Laterality: Right;   KNEE ARTHROPLASTY  ?1990's   "?right; w/cartilage repair" (02/20/2012)   MASS EXCISION Left 08/28/2021   Procedure: EXCISION OF SOFT TISSUE  MASS LEFT FOOT;  Surgeon: Felipa Furnace, DPM;  Location: [facility];  Service: Podiatry;  Laterality: Left;   NASAL SEPTUM SURGERY  1980's   POSTERIOR CERVICAL FUSION/FORAMINOTOMY  2004   "failed initial fusion; rewired  anterior neck" (02/20/2012)   REVERSE SHOULDER ARTHROPLASTY Right 02/16/2020   Procedure: REVERSE SHOULDER ARTHROPLASTY;  Surgeon: Marchia Bond, MD;  [facility];  Service: Orthopedics;  Laterality: Right;   RIGHT/LEFT HEART CATH AND CORONARY ANGIOGRAPHY N/A 01/11/2021   Procedure: RIGHT/LEFT HEART CATH AND CORONARY ANGIOGRAPHY;  Surgeon: Leonie Man, MD;  Location: [facility];  Service: Cardiovascular;  Laterality: N/A;   SPINE SURGERY N/A    Phreesia 10/21/2019   TONSILLECTOMY  ~ 1953   VESICOVAGINAL FISTULA CLOSURE W/ TAH  1988   VIDEO BRONCHOSCOPY Bilateral 08/23/2016   Procedure: VIDEO BRONCHOSCOPY WITH FLUORO;  Surgeon: Javier Glazier, MD;  [facility];  Service: Cardiopulmonary;  Laterality: Bilateral;   VIDEO BRONCHOSCOPY N/A 04/05/2020   Procedure: VIDEO BRONCHOSCOPY WITHOUT FLUORO;  Surgeon: Freddi Starr, MD;  [facility];  Service: Pulmonary;  Laterality: N/A;   Social History   Social History Narrative   Patient lives at home alone. Patient  divorced.    Patient has her BS degree.   Right handed.   Caffeine- sometimes coffee.  [facility]:   Born in [place], [place]. She worked as a Copywriter, advertising. She has no pets currently. She does have indoor plants. Previously had mold in her  home that was remediated. Carpet was removed.          Immunization History  Administered Date(s) Administered   Covid-19, Mrna,Vaccine(Spikevax)74yr and older 01/08/2022   DTaP 08/18/2013   Fluad Quad(high Dose 65+) 10/16/2018, 11/20/2019, 11/03/2020   Hepatitis A 09/04/2007, 03/02/2008   Hepatitis B 01/07/1985, 02/06/1985, 08/17/1985   Influenza Split 11/13/2010, 11/22/2011, 10/20/2012   Influenza Whole 11/14/2009, 11/21/2011   Influenza, High Dose Seasonal PF 11/09/2015, 11/27/2017   Influenza, Quadrivalent, Recombinant, Inj, Pf 11/20/2019   Influenza,inj,Quad PF,6+ Mos 11/06/2013, 11/09/2014, 11/06/2016   Influenza-Unspecified 11/06/2021   Meningococcal Conjugate 09/04/2007   PFIZER(Purple Top)SARS-COV-2 Vaccination 03/12/2019, 04/01/2019, 12/16/2019, 06/29/2020   Pfizer Covid-19 Vaccine Bivalent Booster 140yr& up 03/02/2021   Pneumococcal Conjugate-13 05/18/2013   Pneumococcal Polysaccharide-23 01/05/1994, 11/28/2011, 05/27/2017   Respiratory Syncytial Virus Vaccine,Recomb Aduvanted(Arexvy) 11/06/2021   Td 07/24/1995, 03/16/2005   Tdap 08/18/2013   Zoster Recombinat (Shingrix) 09/18/2017   Zoster, Live 03/02/2008     Objective: Vital Signs: There were no vitals taken for this visit.   Physical Exam   Musculoskeletal Exam: ***  CDAI Exam: CDAI Score: -- Patient Global: --; Provider Global: -- Swollen: --; Tender: -- Joint Exam 04/25/2022   No joint exam has been documented for this visit   There is currently no information documented on the homunculus. Go to the Rheumatology activity and complete the homunculus joint exam.  Investigation: No additional findings.  Imaging: CT CHEST HIGH RESOLUTION  Result Date: 04/17/2022 CLINICAL DATA:  Chronic cough, history of rheumatoid arthritis history of left breast cancer * Tracking Code: BO * EXAM: CT CHEST WITHOUT CONTRAST TECHNIQUE: Multidetector CT imaging of the chest was performed following the standard protocol  without intravenous contrast. High resolution imaging of the lungs, as well as inspiratory and expiratory imaging, was performed. RADIATION DOSE REDUCTION: This exam was performed according to the departmental dose-optimization program which includes automated exposure control, adjustment of the mA and/or kV according to patient size and/or use of iterative reconstruction technique. COMPARISON:  08/23/2020 FINDINGS: Cardiovascular: Aortic atherosclerosis. Aortic valve calcifications. Normal heart size. Three-vessel coronary artery calcifications and or stents no pericardial effusion. Mediastinum/Nodes: No enlarged mediastinal, hilar, or axillary lymph nodes. Thyroid gland, trachea, and esophagus demonstrate no significant findings. Lungs/Pleura: BlCriss Rosalesppearing scarring of the bilateral lung bases with mild associated tubular bronchiectasis, similar to prior examination. No significant air trapping on expiratory phase imaging. Fine, clustered centrilobular and tree-in-bud nodularity of the posterior right upper lobe, unchanged (series 7, image 66). Minimal subpleural radiation fibrosis of the anterior left lung (series 7, image 70). No pleural effusion or pneumothorax. Upper Abdomen: No acute abnormality.  Status post cholecystectomy. Musculoskeletal: Postoperative findings of left lumpectomy. No acute osseous findings. Chronic fracture deformities of the posterior left ribs. Status post right shoulder arthroplasty. IMPRESSION: 1. Bland appearing scarring of the bilateral lung bases with mild associated tubular bronchiectasis, similar to prior examination. No findings of fibrotic interstitial lung disease. 2. Fine, clustered centrilobular and tree-in-bud nodularity of the posterior right upper lobe, unchanged and consistent with atypical infection, including atypical mycobacterium. No evidence of ongoing infection. 3. Minimal subpleural radiation fibrosis of the anterior left lung. 4. Postoperative findings of left  lumpectomy. 5. Coronary artery disease. 6. Aortic valve calcifications. Correlate for echocardiographic evidence of aortic valve dysfunction. Aortic Atherosclerosis (ICD10-I70.0). Electronically Signed  By: Delanna Ahmadi M.D.   On: 04/17/2022 14:46    Recent Labs: Lab Results  Component Value Date   WBC 10.9 (H) 03/07/2022   HGB 11.9 03/07/2022   PLT 259 03/07/2022   NA 143 03/07/2022   K 4.8 03/07/2022   CL 103 03/07/2022   CO2 27 03/07/2022   GLUCOSE 133 (H) 03/07/2022   BUN 32 (H) 03/07/2022   CREATININE 1.68 (H) 03/07/2022   BILITOT 0.5 12/21/2021   ALKPHOS 60 12/21/2021   AST 49 (H) 12/21/2021   ALT 57 (H) 12/21/2021   PROT 7.3 12/21/2021   ALBUMIN 3.7 12/21/2021   CALCIUM 9.9 03/07/2022   GFRAA 40 (L) 08/25/2020    Speciality Comments: No specialty comments available.  Procedures:  No procedures performed Allergies: Baclofen, Dust mite extract, Molds & smuts, Morphine and related, Other, Penicillins, Rofecoxib, Shellfish allergy, Tetracycline hcl, Xolair [omalizumab], Zoledronic acid, Dilaudid [hydromorphone hcl], Levofloxacin, Oxycodone hcl, Paroxetine, Celecoxib, Diltiazem hcl, Hydrocodone-acetaminophen, Lactose intolerance (gi), Rituximab, Tree extract, Gamunex [immune globulin], and Penicillin g procaine   Assessment / Plan:     Visit Diagnoses: No diagnosis found.  ***  Orders: No orders of the defined types were placed in this encounter.  No orders of the defined types were placed in this encounter.    Follow-Up Instructions: No follow-ups on file.   Collier Salina, MD  Note - This record has been created using [employer].  Chart creation errors have been sought, but may not always  have been located. Such creation errors do not reflect on  the standard of medical care.

## 2022-04-25 ENCOUNTER — Other Ambulatory Visit: Payer: Self-pay

## 2022-04-25 ENCOUNTER — Emergency Department (HOSPITAL_COMMUNITY): Payer: Medicare PPO

## 2022-04-25 ENCOUNTER — Encounter (HOSPITAL_COMMUNITY): Payer: Self-pay | Admitting: Emergency Medicine

## 2022-04-25 ENCOUNTER — Ambulatory Visit: Payer: Medicare PPO | Admitting: Internal Medicine

## 2022-04-25 ENCOUNTER — Inpatient Hospital Stay (HOSPITAL_COMMUNITY)
Admission: EM | Admit: 2022-04-25 | Discharge: 2022-05-05 | DRG: 871 | Disposition: A | Discharge status: Skilled Nursing Facility | Payer: Medicare PPO | Attending: Internal Medicine | Admitting: Internal Medicine

## 2022-04-25 DIAGNOSIS — Z853 Personal history of malignant neoplasm of breast: Secondary | ICD-10-CM

## 2022-04-25 DIAGNOSIS — I255 Ischemic cardiomyopathy: Secondary | ICD-10-CM | POA: Diagnosis present

## 2022-04-25 DIAGNOSIS — Z7902 Long term (current) use of antithrombotics/antiplatelets: Secondary | ICD-10-CM

## 2022-04-25 DIAGNOSIS — E440 Moderate protein-calorie malnutrition: Secondary | ICD-10-CM | POA: Diagnosis present

## 2022-04-25 DIAGNOSIS — A419 Sepsis, unspecified organism: Secondary | ICD-10-CM | POA: Diagnosis present

## 2022-04-25 DIAGNOSIS — M6282 Rhabdomyolysis: Secondary | ICD-10-CM | POA: Diagnosis not present

## 2022-04-25 DIAGNOSIS — M0600 Rheumatoid arthritis without rheumatoid factor, unspecified site: Secondary | ICD-10-CM | POA: Diagnosis not present

## 2022-04-25 DIAGNOSIS — J470 Bronchiectasis with acute lower respiratory infection: Secondary | ICD-10-CM | POA: Diagnosis present

## 2022-04-25 DIAGNOSIS — H109 Unspecified conjunctivitis: Secondary | ICD-10-CM | POA: Diagnosis present

## 2022-04-25 DIAGNOSIS — E1122 Type 2 diabetes mellitus with diabetic chronic kidney disease: Secondary | ICD-10-CM | POA: Diagnosis present

## 2022-04-25 DIAGNOSIS — D801 Nonfamilial hypogammaglobulinemia: Secondary | ICD-10-CM | POA: Diagnosis not present

## 2022-04-25 DIAGNOSIS — Z7401 Bed confinement status: Secondary | ICD-10-CM | POA: Diagnosis not present

## 2022-04-25 DIAGNOSIS — M19011 Primary osteoarthritis, right shoulder: Secondary | ICD-10-CM

## 2022-04-25 DIAGNOSIS — M797 Fibromyalgia: Secondary | ICD-10-CM | POA: Diagnosis present

## 2022-04-25 DIAGNOSIS — Z90710 Acquired absence of both cervix and uterus: Secondary | ICD-10-CM

## 2022-04-25 DIAGNOSIS — Z801 Family history of malignant neoplasm of trachea, bronchus and lung: Secondary | ICD-10-CM

## 2022-04-25 DIAGNOSIS — K6289 Other specified diseases of anus and rectum: Secondary | ICD-10-CM | POA: Diagnosis not present

## 2022-04-25 DIAGNOSIS — R6520 Severe sepsis without septic shock: Secondary | ICD-10-CM

## 2022-04-25 DIAGNOSIS — Z981 Arthrodesis status: Secondary | ICD-10-CM

## 2022-04-25 DIAGNOSIS — J479 Bronchiectasis, uncomplicated: Secondary | ICD-10-CM | POA: Diagnosis not present

## 2022-04-25 DIAGNOSIS — M47816 Spondylosis without myelopathy or radiculopathy, lumbar region: Secondary | ICD-10-CM | POA: Diagnosis present

## 2022-04-25 DIAGNOSIS — J398 Other specified diseases of upper respiratory tract: Secondary | ICD-10-CM | POA: Diagnosis not present

## 2022-04-25 DIAGNOSIS — R0602 Shortness of breath: Secondary | ICD-10-CM | POA: Diagnosis not present

## 2022-04-25 DIAGNOSIS — R531 Weakness: Secondary | ICD-10-CM | POA: Diagnosis not present

## 2022-04-25 DIAGNOSIS — I272 Pulmonary hypertension, unspecified: Secondary | ICD-10-CM | POA: Diagnosis not present

## 2022-04-25 DIAGNOSIS — R41841 Cognitive communication deficit: Secondary | ICD-10-CM | POA: Diagnosis not present

## 2022-04-25 DIAGNOSIS — G9341 Metabolic encephalopathy: Secondary | ICD-10-CM | POA: Diagnosis present

## 2022-04-25 DIAGNOSIS — Z8249 Family history of ischemic heart disease and other diseases of the circulatory system: Secondary | ICD-10-CM

## 2022-04-25 DIAGNOSIS — R000 Tachycardia, unspecified: Secondary | ICD-10-CM | POA: Diagnosis not present

## 2022-04-25 DIAGNOSIS — Z7952 Long term (current) use of systemic steroids: Secondary | ICD-10-CM

## 2022-04-25 DIAGNOSIS — E7800 Pure hypercholesterolemia, unspecified: Secondary | ICD-10-CM | POA: Diagnosis present

## 2022-04-25 DIAGNOSIS — R5381 Other malaise: Secondary | ICD-10-CM | POA: Diagnosis present

## 2022-04-25 DIAGNOSIS — M7989 Other specified soft tissue disorders: Secondary | ICD-10-CM | POA: Diagnosis not present

## 2022-04-25 DIAGNOSIS — I5022 Chronic systolic (congestive) heart failure: Secondary | ICD-10-CM | POA: Diagnosis present

## 2022-04-25 DIAGNOSIS — N1832 Chronic kidney disease, stage 3b: Secondary | ICD-10-CM | POA: Diagnosis present

## 2022-04-25 DIAGNOSIS — E039 Hypothyroidism, unspecified: Secondary | ICD-10-CM | POA: Diagnosis present

## 2022-04-25 DIAGNOSIS — J4540 Moderate persistent asthma, uncomplicated: Secondary | ICD-10-CM | POA: Diagnosis present

## 2022-04-25 DIAGNOSIS — R4182 Altered mental status, unspecified: Secondary | ICD-10-CM | POA: Diagnosis not present

## 2022-04-25 DIAGNOSIS — E785 Hyperlipidemia, unspecified: Secondary | ICD-10-CM | POA: Diagnosis present

## 2022-04-25 DIAGNOSIS — Z86711 Personal history of pulmonary embolism: Secondary | ICD-10-CM

## 2022-04-25 DIAGNOSIS — M069 Rheumatoid arthritis, unspecified: Secondary | ICD-10-CM | POA: Diagnosis present

## 2022-04-25 DIAGNOSIS — I130 Hypertensive heart and chronic kidney disease with heart failure and stage 1 through stage 4 chronic kidney disease, or unspecified chronic kidney disease: Secondary | ICD-10-CM | POA: Diagnosis not present

## 2022-04-25 DIAGNOSIS — R935 Abnormal findings on diagnostic imaging of other abdominal regions, including retroperitoneum: Secondary | ICD-10-CM | POA: Diagnosis not present

## 2022-04-25 DIAGNOSIS — R059 Cough, unspecified: Secondary | ICD-10-CM | POA: Diagnosis not present

## 2022-04-25 DIAGNOSIS — K219 Gastro-esophageal reflux disease without esophagitis: Secondary | ICD-10-CM | POA: Diagnosis present

## 2022-04-25 DIAGNOSIS — Z1152 Encounter for screening for COVID-19: Secondary | ICD-10-CM | POA: Diagnosis not present

## 2022-04-25 DIAGNOSIS — Z833 Family history of diabetes mellitus: Secondary | ICD-10-CM

## 2022-04-25 DIAGNOSIS — E1151 Type 2 diabetes mellitus with diabetic peripheral angiopathy without gangrene: Secondary | ICD-10-CM | POA: Diagnosis present

## 2022-04-25 DIAGNOSIS — I2489 Other forms of acute ischemic heart disease: Secondary | ICD-10-CM | POA: Diagnosis present

## 2022-04-25 DIAGNOSIS — M6281 Muscle weakness (generalized): Secondary | ICD-10-CM | POA: Diagnosis not present

## 2022-04-25 DIAGNOSIS — K580 Irritable bowel syndrome with diarrhea: Secondary | ICD-10-CM | POA: Diagnosis present

## 2022-04-25 DIAGNOSIS — J189 Pneumonia, unspecified organism: Secondary | ICD-10-CM | POA: Diagnosis not present

## 2022-04-25 DIAGNOSIS — Z955 Presence of coronary angioplasty implant and graft: Secondary | ICD-10-CM | POA: Diagnosis not present

## 2022-04-25 DIAGNOSIS — R509 Fever, unspecified: Secondary | ICD-10-CM | POA: Diagnosis not present

## 2022-04-25 DIAGNOSIS — R7989 Other specified abnormal findings of blood chemistry: Secondary | ICD-10-CM | POA: Diagnosis not present

## 2022-04-25 DIAGNOSIS — R109 Unspecified abdominal pain: Secondary | ICD-10-CM | POA: Diagnosis not present

## 2022-04-25 DIAGNOSIS — R2681 Unsteadiness on feet: Secondary | ICD-10-CM | POA: Diagnosis not present

## 2022-04-25 DIAGNOSIS — R2689 Other abnormalities of gait and mobility: Secondary | ICD-10-CM | POA: Diagnosis not present

## 2022-04-25 DIAGNOSIS — Z7901 Long term (current) use of anticoagulants: Secondary | ICD-10-CM

## 2022-04-25 DIAGNOSIS — Z96611 Presence of right artificial shoulder joint: Secondary | ICD-10-CM | POA: Diagnosis present

## 2022-04-25 DIAGNOSIS — M1120 Other chondrocalcinosis, unspecified site: Secondary | ICD-10-CM

## 2022-04-25 DIAGNOSIS — I2510 Atherosclerotic heart disease of native coronary artery without angina pectoris: Secondary | ICD-10-CM | POA: Diagnosis present

## 2022-04-25 DIAGNOSIS — I10 Essential (primary) hypertension: Secondary | ICD-10-CM | POA: Diagnosis not present

## 2022-04-25 DIAGNOSIS — E876 Hypokalemia: Secondary | ICD-10-CM | POA: Diagnosis present

## 2022-04-25 DIAGNOSIS — Z86718 Personal history of other venous thrombosis and embolism: Secondary | ICD-10-CM

## 2022-04-25 DIAGNOSIS — E8721 Acute metabolic acidosis: Secondary | ICD-10-CM | POA: Diagnosis present

## 2022-04-25 DIAGNOSIS — R0902 Hypoxemia: Secondary | ICD-10-CM | POA: Diagnosis not present

## 2022-04-25 DIAGNOSIS — J4550 Severe persistent asthma, uncomplicated: Secondary | ICD-10-CM | POA: Diagnosis not present

## 2022-04-25 DIAGNOSIS — I5043 Acute on chronic combined systolic (congestive) and diastolic (congestive) heart failure: Secondary | ICD-10-CM | POA: Diagnosis not present

## 2022-04-25 DIAGNOSIS — J9601 Acute respiratory failure with hypoxia: Secondary | ICD-10-CM | POA: Diagnosis present

## 2022-04-25 DIAGNOSIS — Z823 Family history of stroke: Secondary | ICD-10-CM

## 2022-04-25 DIAGNOSIS — Z79899 Other long term (current) drug therapy: Secondary | ICD-10-CM | POA: Diagnosis not present

## 2022-04-25 DIAGNOSIS — E119 Type 2 diabetes mellitus without complications: Secondary | ICD-10-CM | POA: Diagnosis not present

## 2022-04-25 DIAGNOSIS — M810 Age-related osteoporosis without current pathological fracture: Secondary | ICD-10-CM | POA: Diagnosis present

## 2022-04-25 DIAGNOSIS — N179 Acute kidney failure, unspecified: Secondary | ICD-10-CM | POA: Diagnosis present

## 2022-04-25 DIAGNOSIS — M79642 Pain in left hand: Secondary | ICD-10-CM | POA: Diagnosis not present

## 2022-04-25 DIAGNOSIS — J45901 Unspecified asthma with (acute) exacerbation: Secondary | ICD-10-CM | POA: Diagnosis not present

## 2022-04-25 DIAGNOSIS — F32A Depression, unspecified: Secondary | ICD-10-CM | POA: Diagnosis present

## 2022-04-25 DIAGNOSIS — Z800 Family history of malignant neoplasm of digestive organs: Secondary | ICD-10-CM

## 2022-04-25 LAB — CBC WITH DIFFERENTIAL/PLATELET
Abs Immature Granulocytes: 0.12 10*3/uL — ABNORMAL HIGH (ref 0.00–0.07)
Basophils Absolute: 0.1 10*3/uL (ref 0.0–0.1)
Basophils Relative: 0 %
Eosinophils Absolute: 0.5 10*3/uL (ref 0.0–0.5)
Eosinophils Relative: 3 %
HCT: 39.6 % (ref 36.0–46.0)
Hemoglobin: 12.8 g/dL (ref 12.0–15.0)
Immature Granulocytes: 1 %
Lymphocytes Relative: 10 %
Lymphs Abs: 1.8 10*3/uL (ref 0.7–4.0)
MCH: 35.1 pg — ABNORMAL HIGH (ref 26.0–34.0)
MCHC: 32.3 g/dL (ref 30.0–36.0)
MCV: 108.5 fL — ABNORMAL HIGH (ref 80.0–100.0)
Monocytes Absolute: 2.4 10*3/uL — ABNORMAL HIGH (ref 0.1–1.0)
Monocytes Relative: 14 %
Neutro Abs: 13 10*3/uL — ABNORMAL HIGH (ref 1.7–7.7)
Neutrophils Relative %: 72 %
Platelets: 182 10*3/uL (ref 150–400)
RBC: 3.65 MIL/uL — ABNORMAL LOW (ref 3.87–5.11)
RDW: 15.3 % (ref 11.5–15.5)
WBC: 17.8 10*3/uL — ABNORMAL HIGH (ref 4.0–10.5)
nRBC: 0 % (ref 0.0–0.2)

## 2022-04-25 LAB — BRAIN NATRIURETIC PEPTIDE: B Natriuretic Peptide: 1049.7 pg/mL — ABNORMAL HIGH (ref 0.0–100.0)

## 2022-04-25 LAB — URINALYSIS, W/ REFLEX TO CULTURE (INFECTION SUSPECTED)
Bacteria, UA: NONE SEEN
Bilirubin Urine: NEGATIVE
Glucose, UA: 150 mg/dL — AB
Hgb urine dipstick: NEGATIVE
Ketones, ur: NEGATIVE mg/dL
Leukocytes,Ua: NEGATIVE
Nitrite: NEGATIVE
Protein, ur: 30 mg/dL — AB
Specific Gravity, Urine: 1.017 (ref 1.005–1.030)
pH: 7 (ref 5.0–8.0)

## 2022-04-25 LAB — TYPE AND SCREEN
ABO/RH(D): A POS
Antibody Screen: NEGATIVE

## 2022-04-25 LAB — TROPONIN I (HIGH SENSITIVITY)
Troponin I (High Sensitivity): 112 ng/L (ref <18)
Troponin I (High Sensitivity): 130 ng/L (ref <18)

## 2022-04-25 LAB — COMPREHENSIVE METABOLIC PANEL
ALT: 25 U/L (ref 0–44)
AST: 38 U/L (ref 15–41)
Albumin: 3.5 g/dL (ref 3.5–5.0)
Alkaline Phosphatase: 53 U/L (ref 38–126)
Anion gap: 12 (ref 5–15)
BUN: 29 mg/dL — ABNORMAL HIGH (ref 8–23)
CO2: 24 mmol/L (ref 22–32)
Calcium: 9.3 mg/dL (ref 8.9–10.3)
Chloride: 105 mmol/L (ref 98–111)
Creatinine, Ser: 1.81 mg/dL — ABNORMAL HIGH (ref 0.44–1.00)
GFR, Estimated: 29 mL/min — ABNORMAL LOW (ref >60)
Glucose, Bld: 97 mg/dL (ref 70–99)
Potassium: 3.5 mmol/L (ref 3.5–5.1)
Sodium: 141 mmol/L (ref 135–145)
Total Bilirubin: 1.2 mg/dL (ref 0.3–1.2)
Total Protein: 7.6 g/dL (ref 6.5–8.1)

## 2022-04-25 LAB — RESP PANEL BY RT-PCR (RSV, FLU A&B, COVID)  RVPGX2
Influenza A by PCR: NEGATIVE
Influenza B by PCR: NEGATIVE
Resp Syncytial Virus by PCR: NEGATIVE
SARS Coronavirus 2 by RT PCR: NEGATIVE

## 2022-04-25 LAB — EXPECTORATED SPUTUM ASSESSMENT W GRAM STAIN, RFLX TO RESP C

## 2022-04-25 LAB — LACTIC ACID, PLASMA
Lactic Acid, Venous: 1.9 mmol/L (ref 0.5–1.9)
Lactic Acid, Venous: 1.2 mmol/L (ref 0.5–1.9)

## 2022-04-25 LAB — PROTIME-INR
INR: 1.2 (ref 0.8–1.2)
Prothrombin Time: 14.6 seconds (ref 11.4–15.2)

## 2022-04-25 LAB — CBG MONITORING, ED: Glucose-Capillary: 102 mg/dL — ABNORMAL HIGH (ref 70–99)

## 2022-04-25 MED ORDER — VANCOMYCIN HCL 1500 MG/300ML IV SOLN
1500.0000 mg | Freq: Once | INTRAVENOUS | Status: AC
  Administered 2022-04-25: 1500 mg via INTRAVENOUS
  Filled 2022-04-25: qty 300

## 2022-04-25 MED ORDER — RIVAROXABAN 20 MG PO TABS
20.0000 mg | ORAL_TABLET | Freq: Every day | ORAL | Status: DC
  Administered 2022-04-25 – 2022-05-04 (×10): 20 mg via ORAL
  Filled 2022-04-25 (×10): qty 1

## 2022-04-25 MED ORDER — POLYVINYL ALCOHOL 1.4 % OP SOLN
1.0000 [drp] | Freq: Two times a day (BID) | OPHTHALMIC | Status: DC
  Administered 2022-04-25 – 2022-05-05 (×20): 1 [drp] via OPHTHALMIC
  Filled 2022-04-25: qty 15

## 2022-04-25 MED ORDER — IPRATROPIUM-ALBUTEROL 0.5-2.5 (3) MG/3ML IN SOLN: 3.0000 mL | Freq: Four times a day (QID) | RESPIRATORY_TRACT | Status: DC | PRN

## 2022-04-25 MED ORDER — SODIUM CHLORIDE 0.9 % IV BOLUS
500.0000 mL | Freq: Once | INTRAVENOUS | Status: AC
  Administered 2022-04-25: 500 mL via INTRAVENOUS

## 2022-04-25 MED ORDER — SODIUM CHLORIDE 0.9 % IV SOLN: INTRAVENOUS | Status: DC

## 2022-04-25 MED ORDER — ALLOPURINOL 100 MG PO TABS
100.0000 mg | ORAL_TABLET | Freq: Every day | ORAL | Status: DC
  Administered 2022-04-25 – 2022-05-04 (×10): 100 mg via ORAL
  Filled 2022-04-25 (×10): qty 1

## 2022-04-25 MED ORDER — ACETAMINOPHEN 650 MG RE SUPP
650.0000 mg | Freq: Once | RECTAL | Status: AC
  Administered 2022-04-25: 650 mg via RECTAL
  Filled 2022-04-25: qty 1

## 2022-04-25 MED ORDER — TRAMADOL HCL 50 MG PO TABS
100.0000 mg | ORAL_TABLET | Freq: Two times a day (BID) | ORAL | Status: DC | PRN
  Administered 2022-04-26 – 2022-04-27 (×2): 100 mg via ORAL
  Filled 2022-04-25 (×2): qty 2

## 2022-04-25 MED ORDER — SODIUM CHLORIDE 0.9 % IV SOLN
2.0000 g | Freq: Once | INTRAVENOUS | Status: AC
  Administered 2022-04-25: 2 g via INTRAVENOUS
  Filled 2022-04-25: qty 12.5

## 2022-04-25 MED ORDER — CLOPIDOGREL BISULFATE 75 MG PO TABS
75.0000 mg | ORAL_TABLET | Freq: Every day | ORAL | Status: DC
  Administered 2022-04-27 – 2022-05-05 (×9): 75 mg via ORAL
  Filled 2022-04-25 (×9): qty 1

## 2022-04-25 MED ORDER — ONDANSETRON HCL 4 MG/2ML IJ SOLN
4.0000 mg | Freq: Four times a day (QID) | INTRAMUSCULAR | Status: DC | PRN
  Administered 2022-04-28 – 2022-04-29 (×2): 4 mg via INTRAVENOUS
  Filled 2022-04-25 (×2): qty 2

## 2022-04-25 MED ORDER — METRONIDAZOLE 500 MG/100ML IV SOLN
500.0000 mg | Freq: Once | INTRAVENOUS | Status: AC
  Administered 2022-04-25: 500 mg via INTRAVENOUS
  Filled 2022-04-25: qty 100

## 2022-04-25 MED ORDER — FOLIC ACID 1 MG PO TABS
1.0000 mg | ORAL_TABLET | Freq: Every day | ORAL | Status: DC
  Administered 2022-04-27 – 2022-05-05 (×9): 1 mg via ORAL
  Filled 2022-04-25 (×9): qty 1

## 2022-04-25 MED ORDER — DICLOFENAC SODIUM 1 % EX GEL
1.0000 | Freq: Four times a day (QID) | CUTANEOUS | Status: DC | PRN
  Administered 2022-04-27: 1 via TOPICAL
  Filled 2022-04-25: qty 100

## 2022-04-25 MED ORDER — FAMOTIDINE 20 MG PO TABS
20.0000 mg | ORAL_TABLET | Freq: Every day | ORAL | Status: DC
  Administered 2022-04-25 – 2022-05-04 (×10): 20 mg via ORAL
  Filled 2022-04-25 (×10): qty 1

## 2022-04-25 MED ORDER — ISOSORB DINITRATE-HYDRALAZINE 20-37.5 MG PO TABS: 1.0000 | ORAL_TABLET | Freq: Every day | ORAL | Status: DC

## 2022-04-25 MED ORDER — SODIUM CHLORIDE 0.9 % IV SOLN
500.0000 mg | INTRAVENOUS | Status: AC
  Administered 2022-04-25 – 2022-04-29 (×5): 500 mg via INTRAVENOUS
  Filled 2022-04-25 (×5): qty 5

## 2022-04-25 MED ORDER — ENOXAPARIN SODIUM 40 MG/0.4ML IJ SOSY: 40.0000 mg | PREFILLED_SYRINGE | INTRAMUSCULAR | Status: DC

## 2022-04-25 MED ORDER — UMECLIDINIUM BROMIDE 62.5 MCG/ACT IN AEPB
1.0000 | INHALATION_SPRAY | Freq: Every day | RESPIRATORY_TRACT | Status: DC
  Administered 2022-04-26 – 2022-05-05 (×10): 1 via RESPIRATORY_TRACT
  Filled 2022-04-25 (×2): qty 7

## 2022-04-25 MED ORDER — BENZONATATE 100 MG PO CAPS
200.0000 mg | ORAL_CAPSULE | Freq: Two times a day (BID) | ORAL | Status: DC | PRN
  Administered 2022-04-28 – 2022-05-03 (×3): 200 mg via ORAL
  Filled 2022-04-25 (×3): qty 2

## 2022-04-25 MED ORDER — IRBESARTAN 75 MG PO TABS: 37.5000 mg | ORAL_TABLET | Freq: Every day | ORAL | Status: DC

## 2022-04-25 MED ORDER — ONDANSETRON HCL 4 MG PO TABS
4.0000 mg | ORAL_TABLET | Freq: Four times a day (QID) | ORAL | Status: DC | PRN
  Administered 2022-04-30: 4 mg via ORAL
  Filled 2022-04-25: qty 1

## 2022-04-25 MED ORDER — FLUTICASONE PROPIONATE 50 MCG/ACT NA SUSP
2.0000 | Freq: Every day | NASAL | Status: DC
  Administered 2022-04-28 – 2022-05-05 (×7): 2 via NASAL
  Filled 2022-04-25: qty 16

## 2022-04-25 MED ORDER — METRONIDAZOLE 500 MG/100ML IV SOLN
500.0000 mg | Freq: Two times a day (BID) | INTRAVENOUS | Status: DC
  Administered 2022-04-26 – 2022-04-29 (×9): 500 mg via INTRAVENOUS
  Filled 2022-04-25 (×9): qty 100

## 2022-04-25 MED ORDER — LORATADINE 10 MG PO TABS: 10.0000 mg | ORAL_TABLET | Freq: Every day | ORAL | Status: DC | PRN

## 2022-04-25 MED ORDER — ARFORMOTEROL TARTRATE 15 MCG/2ML IN NEBU
15.0000 ug | INHALATION_SOLUTION | Freq: Two times a day (BID) | RESPIRATORY_TRACT | Status: DC
  Administered 2022-04-25 – 2022-05-05 (×19): 15 ug via RESPIRATORY_TRACT
  Filled 2022-04-25 (×21): qty 2

## 2022-04-25 MED ORDER — TRAMADOL HCL 50 MG PO TABS: 50.0000 mg | ORAL_TABLET | Freq: Four times a day (QID) | ORAL | Status: DC | PRN

## 2022-04-25 MED ORDER — MONTELUKAST SODIUM 10 MG PO TABS
10.0000 mg | ORAL_TABLET | Freq: Every day | ORAL | Status: DC
  Administered 2022-04-26 – 2022-05-04 (×8): 10 mg via ORAL
  Filled 2022-04-25 (×9): qty 1

## 2022-04-25 MED ORDER — SODIUM CHLORIDE 0.9 % IV SOLN
2.0000 g | INTRAVENOUS | Status: AC
  Administered 2022-04-26 – 2022-04-30 (×5): 2 g via INTRAVENOUS
  Filled 2022-04-25 (×5): qty 20

## 2022-04-25 MED ORDER — ATORVASTATIN CALCIUM 40 MG PO TABS
40.0000 mg | ORAL_TABLET | Freq: Every day | ORAL | Status: DC
  Administered 2022-04-25: 40 mg via ORAL
  Filled 2022-04-25: qty 1

## 2022-04-25 MED ORDER — GABAPENTIN 300 MG PO CAPS
300.0000 mg | ORAL_CAPSULE | Freq: Every day | ORAL | Status: DC
  Administered 2022-04-25: 300 mg via ORAL
  Filled 2022-04-25: qty 1

## 2022-04-25 MED ORDER — GUAIFENESIN ER 600 MG PO TB12
600.0000 mg | ORAL_TABLET | Freq: Two times a day (BID) | ORAL | Status: DC
  Administered 2022-04-25 – 2022-05-04 (×17): 600 mg via ORAL
  Filled 2022-04-25 (×17): qty 1

## 2022-04-25 NOTE — Progress Notes (Signed)
A consult was received from an ED physician for sepsis per pharmacy dosing.  The patient's profile has been reviewed for ht/wt/allergies/indication/available labs.    A one time order has been placed for Vancomycin 1.5 gm x 1 dose. Cefepime 2 gm x 1 dose.  Further antibiotics/pharmacy consults should be ordered by admitting physician if indicated.                       Thank you, Heywood Footman 04/25/2022  12:01 PM

## 2022-04-25 NOTE — Sepsis Progress Note (Signed)
eLink is following this Code Sepsis. °

## 2022-04-25 NOTE — H&P (Signed)
History and Physical    Ebony Scott [id number] DOB: 1950-10-19 DOA: 04/25/2022  PCP: [name], MD   Patient coming from: Home  I have personally briefly reviewed patient's old medical records in [place]  Chief Complaint: Altered mental status  HPI: Ebony Scott is a 72 y.o. 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  Patient is a poor historian and does not provide appropriate history.  Apparently, family found her sitting on the couch this morning with altered mental status.  She also had a mechanical fall last night landing on ottoman.  No loss of consciousness, seizures, vomiting reported.  She was found to be febrile today. She apparently had an episode of diarrhea at home today.  ED Course: She was found to be febrile to 103 with tachycardia and tachypnea.  CT of the head without contrast was negative for any acute intracranial abnormality.  CT of abdomen and pelvis without contrast showed possible acute proctitis, possibility of underlying neoplastic process is not excluded; patchy infiltrates in both lower lobes suggestive of atelectasis/pneumonia.  She was given IV fluids and antibiotics. Hospitalist service was called to evaluate the patient.  Review of Systems: Could not be obtained because of patient's confused state.  Past Medical History:  Diagnosis Date   Abscess of dorsum of right hand 09/22/2020   Acute deep vein thrombosis (DVT) of right lower extremity ([place]) 12/13/2017   Allergic rhinitis    Anemia    Angio-edema    Anxiety    pt denies dx   Arthritis    Phreesia 10/21/2019   Asthma    Phreesia 10/21/2019   Breast cancer ([place]) 1998   Left breast, in  remission   Bronchiectasis ([place])    Cataract    REMOVED   CHF (congestive heart failure) ([place])    Clostridium difficile colitis Q000111Q   Complication of anesthesia    "had hard time waking up from it several times" (02/20/2012), no anesthesia problems since 2014   Coronary artery disease    Depression    "some; don't take anything for it" (02/20/2012), pt denies dx as of 06/02/21   Diverticulosis    DVT (deep venous thrombosis) ([place])    Dyspnea    all the time   Fibromyalgia 11/2011   GAVE (gastric antral vascular ectasia)    GERD (gastroesophageal reflux disease)    Graves disease    Headache(784.0)    "related to allergies; more at different times during the year" (02/20/2012)   Hemorrhoids    Hiatal hernia    back and neck   History of blood transfusion 2023   1 unit, 5 units of Iron   Hx of adenomatous colonic polyps 04/12/2016   Hypercholesteremia    good cholesterol is high   Hypothyroidism    IBS (irritable bowel syndrome)    Moderate persistent asthma    -FeV1 72% 2011, -IgE 102 2011, CT sinus Neg 2011   Osteomyelitis of second toe of right foot ([place]) 09/23/2020   Osteoporosis    on reclast yearly   Peripheral vascular disease ([place]) 2019   DVTs (3) lungs and leg   Pneumonia 04/2011; ~ 11/2011   "double; single" (02/20/2012)   Septic olecranon bursitis of right elbow 09/22/2020   Seronegative rheumatoid arthritis ([place])  Dr. Lahoma Rocker   SIRS (systemic inflammatory response syndrome) ([place]) 02/10/2018   Sleep apnea    uses cpap nightly   Tracheobronchomalacia     Past Surgical History:  Procedure Laterality Date   ABDOMINAL HYSTERECTOMY N/A    Phreesia 10/21/2019   ANTERIOR AND POSTERIOR REPAIR  1990's   APPENDECTOMY     ARGON LASER APPLICATION  [id number]   Procedure: ARGON LASER APPLICATION;  Surgeon: Yetta Flock, MD;  [facility]: [facility];  Service: Gastroenterology;;   BREAST LUMPECTOMY  1998   left   BREAST SURGERY N/A    Phreesia  10/21/2019   BRONCHIAL WASHINGS  04/05/2020   Procedure: BRONCHIAL WASHINGS;  Surgeon: Freddi Starr, MD;  [facility];  Service: Pulmonary;;   CAPSULOTOMY Right 08/28/2021   Procedure: CAPSULOTOMY;  Surgeon: Felipa Furnace, DPM;  Location: [facility];  Service: Podiatry;  Laterality: Right;   CARPOMETACARPEL ([place]) FUSION OF THUMB WITH AUTOGRAFT FROM RADIUS  ~ 2009   "both thumbs" (02/20/2012)   CATARACT EXTRACTION W/ INTRAOCULAR LENS  IMPLANT, BILATERAL  2012   CERVICAL DISCECTOMY  10/2001   C5-C6   CERVICAL FUSION  2003   C3-C4   CHOLECYSTECTOMY     COLONOSCOPY     CORONARY STENT INTERVENTION N/A 01/11/2021   Procedure: CORONARY STENT INTERVENTION;  Surgeon: Leonie Man, MD;  Location: [facility];  Service: Cardiovascular;  Laterality: N/A;   DEBRIDEMENT TENNIS ELBOW  ?1970's   right   ESOPHAGOGASTRODUODENOSCOPY     ESOPHAGOGASTRODUODENOSCOPY (EGD) WITH PROPOFOL N/A 02/05/2021   Procedure: ESOPHAGOGASTRODUODENOSCOPY (EGD) WITH PROPOFOL;  Surgeon: Yetta Flock, MD;  [facility];  Service: Gastroenterology;  Laterality: N/A;   EYE SURGERY N/A    Phreesia 10/21/2019   HAMMER TOE SURGERY Right 08/28/2021   Procedure: HAMMER TOE REPAIR 2ND TOE RIGHT FOOT;  Surgeon: Felipa Furnace, DPM;  Location: [facility];  Service: Podiatry;  Laterality: Right;   HYSTERECTOMY     I & D EXTREMITY Right 09/22/2020   Procedure: IRRIGATION AND DEBRIDEMENT RIGHT HAND AND ELBOW;  Surgeon: Marchia Bond, MD;  [facility];  Service: Orthopedics;  Laterality: Right;   KNEE ARTHROPLASTY  ?1990's   "?right; w/cartilage repair" (02/20/2012)   MASS EXCISION Left 08/28/2021   Procedure: EXCISION OF SOFT TISSUE  MASS LEFT FOOT;  Surgeon: Felipa Furnace, DPM;  Location: [place] [facility];  Service: Podiatry;  Laterality: Left;   NASAL SEPTUM SURGERY  1980's   POSTERIOR CERVICAL FUSION/FORAMINOTOMY  2004   "failed initial fusion; rewired  anterior neck" (02/20/2012)   REVERSE SHOULDER  ARTHROPLASTY Right 02/16/2020   Procedure: REVERSE SHOULDER ARTHROPLASTY;  Surgeon: Marchia Bond, MD;  [facility];  Service: Orthopedics;  Laterality: Right;   RIGHT/LEFT HEART CATH AND CORONARY ANGIOGRAPHY N/A 01/11/2021   Procedure: RIGHT/LEFT HEART CATH AND CORONARY ANGIOGRAPHY;  Surgeon: Leonie Man, MD;  Location: [facility];  Service: Cardiovascular;  Laterality: N/A;   SPINE SURGERY N/A    Phreesia 10/21/2019   TONSILLECTOMY  ~ 1953   VESICOVAGINAL FISTULA CLOSURE W/ TAH  1988   VIDEO BRONCHOSCOPY Bilateral 08/23/2016   Procedure: VIDEO BRONCHOSCOPY WITH FLUORO;  Surgeon: Javier Glazier, MD;  [facility];  Service: Cardiopulmonary;  Laterality: Bilateral;   VIDEO BRONCHOSCOPY N/A 04/05/2020   Procedure: VIDEO BRONCHOSCOPY WITHOUT FLUORO;  Surgeon: Freddi Starr, MD;  [facility];  Service: Pulmonary;  Laterality: N/A;     reports that she has never smoked. She  has been exposed to tobacco smoke. She has never used smokeless tobacco. She reports that she does not currently use alcohol. She reports that she does not use drugs.  Allergies  Allergen Reactions   Baclofen Anaphylaxis and Other (See Comments)    Altered mental status requiring a 3-day hospital stay- patient became unresponsive   "Put me in a coma for five days", Altered mental status requiring a 3-day hospital stay- patient became unresponsive   Dust Mite Extract Shortness Of Breath and Other (See Comments)    "sneezing" (02/20/2012) too   Molds & Smuts Shortness Of Breath   Morphine And Related Hives and Itching   Other Shortness Of Breath and Other (See Comments)    Grass and weeds "sneezing; filled sinuses" (02/20/2012)   Penicillins Rash and Other (See Comments)    "welts" (02/20/2012)  Tolerated Ancef 02/16/20   Rofecoxib Swelling and Other (See Comments)    Vioxx- feet swelling   Shellfish Allergy Anaphylaxis, Shortness Of Breath, Itching and Rash   Shrimp Extract  Allergy Skin Test Anaphylaxis and Other (See Comments)    ALL SHELLFISH   Tetracycline Hcl Nausea And Vomiting   Tetracycline Hcl Other (See Comments)    GI Intolerance   Xolair [Omalizumab] Other (See Comments)    Caused Blood clot   Zoledronic Acid Other (See Comments)    Reclast- Fever, Put in hospital, dr said it was a reaction from a reaction    Fever, inability to walk, Put in hospital  Fever, Put in hospital, dr said it was a reaction from a reaction , Other reaction(s): Unknown   Dilaudid [Hydromorphone Hcl] Itching   Hydrocodone-Acetaminophen Itching   Levofloxacin Other (See Comments)    GI upset  Other Reaction(s): GI Intolerance, Other (See Comments)  REACTION: GI upset, GI upset   Oxycodone Hcl Nausea And Vomiting   Oxycodone Hcl Other (See Comments)    GI Intolerance   Paroxetine Nausea And Vomiting and Other (See Comments)    GI Intolerance also   Celecoxib Swelling and Other (See Comments)    Feet swelling   Diltiazem Hcl Swelling   Lactose Other (See Comments)    Bloating and gas   Lactose Intolerance (Gi) Other (See Comments)    Bloating and gas   Oxycodone-Acetaminophen Itching   Rituximab Other (See Comments)    Anemia    Tree Extract Other (See Comments)    "tested and told I was allergic to it; never experienced a reaction to it" (02/20/2012)   Gamunex [Immune Globulin] Itching and Rash    Tolerates hizentra    Penicillin G Procaine Rash and Other (See Comments)    Welts, also    Family History  Problem Relation Age of Onset   Allergies Mother    Heart disease Mother    Arthritis Mother    Lung cancer Mother        heavy smoker   Diabetes Mother    Allergies Father    Heart disease Father    Arthritis Father    Stroke Father    Heart disease Brother    Diabetes Maternal Grandfather    Colitis Daughter    Colon cancer Other        Maternal half uncle    Prior to Admission medications   Medication Sig Start Date End Date Taking?  Authorizing Provider  acetaminophen (TYLENOL) 650 MG CR tablet Take 1,300 mg by mouth daily as needed for pain.   Yes [provider]  acetic acid-hydrocortisone (VOSOL-HC) OTIC solution Place 4 drops into both ears See admin instructions. Instill 4 drops into both ears the first 5 days of the month, then as directed/as needed for itching   Yes [provider]  albuterol (VENTOLIN HFA) 108 (90 Base) MCG/ACT inhaler Inhale 2 puffs into the lungs every 6 (six) hours as needed for wheezing or shortness of breath. 03/01/21  Yes Kozlow, Donnamarie Poag, MD  allopurinol (ZYLOPRIM) 100 MG tablet TAKE 1 TABLET BY MOUTH DAILY Patient taking differently: Take 100 mg by mouth daily with supper. 02/15/22  Yes [name], MD  Alpha-D-Galactosidase (BEANO PO) Take 2 tablets by mouth 3 (three) times daily before meals.   Yes [provider]  Alpha-Lipoic Acid 600 MG TABS Take 600 mg by mouth daily.   Yes [provider]  ammonium lactate (AMLACTIN DAILY) 12 % lotion Apply 1 Application topically as needed for dry skin. 03/07/22  Yes Felipa Furnace, DPM  atorvastatin (LIPITOR) 80 MG tablet TAKE 1 TABLET BY MOUTH DAILY Patient taking differently: Take 40 mg by mouth daily with supper. 02/15/22  Yes [name], MD  benzonatate (TESSALON) 200 MG capsule TAKE 1 CAPSULE BY MOUTH THREE TIMES DAILY AS NEEDED FOR COUGH Patient taking differently: Take 200 mg by mouth 2 (two) times daily as needed for cough. 07/13/21  Yes [name], MD  Calcium-Magnesium-Vitamin D (CALCIUM MAGNESIUM PO) Take 1 tablet by mouth at bedtime.   Yes [provider]  cetirizine (ZYRTEC) 10 MG tablet Take 1 tablet (10 mg total) by mouth 2 (two) times daily as needed for allergies (Can take a n extra dose during flare ups.). Patient taking differently: Take 10 mg by mouth at bedtime. 06/13/21  Yes Kozlow, Donnamarie Poag, MD  Cholecalciferol (VITAMIN D3) 25 MCG (1000 UT) capsule Take 1,000 Units by mouth  at bedtime.   Yes [provider]  clopidogrel (PLAVIX) 75 MG tablet Take 1 tablet (75 mg total) by mouth daily. 06/02/21  Yes Loel Dubonnet, NP  cyproheptadine (PERIACTIN) 4 MG tablet Take 2 tablets (8 mg total) by mouth at bedtime. Take 2 tablets at bedtime as needed to treat facial pain or sleep dysfunction. Patient taking differently: Take 8 mg by mouth at bedtime. 06/13/21  Yes Kozlow, Donnamarie Poag, MD  denosumab (PROLIA) 60 MG/ML SOSY injection Inject 60 mg into the skin every 6 (six) months.   Yes [provider]  dexlansoprazole (DEXILANT) 60 MG capsule Take 1 capsule (60 mg total) by mouth daily. 06/13/21  Yes Kozlow, Donnamarie Poag, MD  diclofenac Sodium (VOLTAREN) 1 % GEL Apply 1 Application topically 4 (four) times daily as needed (pain). 11/13/21  Yes [name], MD  empagliflozin (JARDIANCE) 10 MG TABS tablet Take 1 tablet (10 mg total) by mouth daily before breakfast. 04/11/22  Yes [name], MD  EPINEPHrine 0.3 mg/0.3 mL IJ SOAJ injection USE AS DIRECTED BY YOUR PHYSICIAN INTRAMUSCULARLY AS NEEDED FOR ANAPHYLAXIS Patient taking differently: Inject 0.3 mg into the muscle as needed for anaphylaxis. 01/26/22  Yes Kozlow, Donnamarie Poag, MD  famotidine (PEPCID) 20 MG tablet TAKE 1 TABLET BY MOUTH EACH NIGHT AT BEDTIME Patient taking differently: Take 20 mg by mouth at bedtime. 04/04/22  Yes Kozlow, Donnamarie Poag, MD  Fluocinolone Acetonide (DERMOTIC) 0.01 % OIL Place 5 drops into both ears See admin instructions. INSTILL 5 DROPS INTO THE AFFECTED EAR(S) TWICE DAILY FOR 14 DAYS, THEN STOP AND USE AS NEEDED FOR ITCHING[  Yes [provider]  fluticasone (FLONASE) 50 MCG/ACT nasal spray USE 2 SPRAYS IN EACH NOSTRIL DAILY Patient taking differently: Place 2 sprays into both nostrils daily. 09/02/20  Yes Freddi Starr, MD  folic acid (FOLVITE) 1 MG tablet Take 1 tablet (1 mg total) by mouth daily. 02/16/22  Yes [name], MD  furosemide (LASIX) 40 MG tablet Take 1 tablet  (40 mg total) by mouth daily. Patient taking differently: Take 40 mg by mouth in the morning. 11/13/21  Yes Buford Dresser, MD  gabapentin (NEURONTIN) 300 MG capsule TAKE 1 CAPSULE BY MOUTH 3 TIMES DAILY AS NEEDED Patient taking differently: Take 300 mg by mouth at bedtime. 03/07/22  Yes Rice, Resa Miner, MD  guaiFENesin (MUCINEX) 600 MG 12 hr tablet Take 1 tablet (600 mg total) by mouth 2 (two) times daily as needed for cough or to loosen phlegm. Patient taking differently: Take 600 mg by mouth 2 (two) times daily. 05/09/20  Yes [name], MD  HIZENTRA 1 GM/5ML SOLN 13 g by Subcutaneous Infusion route See admin instructions. Hizentra- 13 g infused every Friday  ("3 times/5 ml = 65 ml total once weekly") 08/09/21  Yes [provider]  hydroxychloroquine (PLAQUENIL) 200 MG tablet Take 1 tablet (200 mg total) by mouth daily. 03/07/22  Yes Rice, Resa Miner, MD  ipratropium-albuterol (DUONEB) 0.5-2.5 (3) MG/3ML SOLN Take 3 mLs by nebulization every 6 (six) hours as needed. Patient taking differently: Take 3 mLs by nebulization every 6 (six) hours as needed (for shortness of breath or wheezing). 11/14/21  Yes Freddi Starr, MD  isosorbide-hydrALAZINE (BIDIL) 20-37.5 MG tablet Take 1 tablet by mouth 2 (two) times daily. Patient taking differently: Take 1 tablet by mouth daily with supper. 04/11/22  Yes [name], MD  Lactase (LACTOSE FAST ACTING RELIEF PO) Take 3 tablets by mouth 3 (three) times daily before meals.   Yes [provider]  lidocaine-prilocaine (EMLA) cream Apply 1 application  topically as needed (pain). 04/18/21  Yes [provider]  magnesium oxide (MAG-OX) 400 (240 Mg) MG tablet Take 400 mg by mouth at bedtime.   Yes [provider]  Menthol, Topical Analgesic, (BIOFREEZE) 4 % GEL Apply 1 application  topically daily as needed (for shoulder or joint pain).   Yes [provider]  mirtazapine (REMERON SOL-TAB) 30 MG  disintegrating tablet Take 1 tablet (30 mg total) by mouth at bedtime as needed (sleep). Patient taking differently: Take 30 mg by mouth at bedtime. 11/13/21  Yes [name], MD  montelukast (SINGULAIR) 10 MG tablet Take 1 tablet (10 mg total) by mouth daily. Patient taking differently: Take 10 mg by mouth daily after supper. 04/11/22  Yes [name], MD  Nystatin (GERHARDT'S BUTT CREAM) CREA Apply topically 3 times a day as needed for itching or irritation 12/30/20  Yes [name], MD  nystatin (MYCOSTATIN) 100000 UNIT/ML suspension Use as directed 15 mLs in the mouth or throat at bedtime.   Yes [provider]  potassium chloride (KLOR-CON M) 10 MEQ tablet Take 1 tablet (10 mEq total) by mouth daily. Patient taking differently: Take 10 mEq by mouth daily with supper. 12/21/21  Yes Orson Slick, MD  predniSONE (DELTASONE) 1 MG tablet Take 2 tablets (2 mg total) by mouth daily. Total of 7 mg daily. Patient taking differently: Take 2 mg by mouth daily with breakfast. Total of 7 mg daily. 02/14/22  Yes Rice, Resa Miner, MD  predniSONE (  DELTASONE) 5 MG tablet Take 1 tablet (5 mg total) by mouth daily with breakfast. Along with 2 mg tablets for total daily dose 7 mg total daily 02/14/22  Yes Rice, Resa Miner, MD  Prenatal Vit-Fe Fumarate-FA (PRENATAL VITAMINS PO) Take 1 tablet by mouth daily.   Yes [provider]  Probiotic Product (ALIGN PO) Take 1 capsule by mouth daily.   Yes [provider]  Propylene Glycol (SYSTANE COMPLETE) 0.6 % SOLN Place 1 drop into both eyes 2 (two) times daily. 03/01/21  Yes Kozlow, Donnamarie Poag, MD  Tiotropium Bromide-Olodaterol (STIOLTO RESPIMAT) 2.5-2.5 MCG/ACT AERS Inhale 2 puffs into the lungs daily. Patient taking differently: Inhale 2 puffs into the lungs at bedtime. 08/15/21  Yes Freddi Starr, MD  traMADol (ULTRAM) 50 MG tablet Take 1 tablet (50 mg total) by mouth at bedtime. 04/12/22  Yes [name], MD   valsartan (DIOVAN) 40 MG tablet Take 1 tablet (40 mg total) by mouth daily. Patient taking differently: Take 40 mg by mouth daily with supper. 12/21/21  Yes Orson Slick, MD  XARELTO 20 MG TABS tablet TAKE 1 TABLET BY MOUTH DAILY WITH SUPPER Patient taking differently: Take 20 mg by mouth daily with supper. 02/15/22  Yes [name], MD  azelastine (ASTELIN) 0.1 % nasal spray Place 2 sprays into both nostrils daily. Use in each nostril as directed 06/13/21   Kozlow, Donnamarie Poag, MD  budesonide (PULMICORT) 1 MG/2ML nebulizer solution Take 2 mLs (1 mg total) by nebulization daily. Patient not taking: Reported on 04/25/2022 11/24/21   Mar Daring, PA-C  metroNIDAZOLE (FLAGYL) 250 MG tablet Take 1 tablet (250 mg total) by mouth 3 (three) times daily. Patient not taking: Reported on 04/25/2022 04/06/22   Gatha Mayer, MD    Physical Exam: Vitals:   04/25/22 1230 04/25/22 1330 04/25/22 1430 04/25/22 1445  BP: 125/72 (!) 140/65 (!) 134/98 (!) 108/58  Pulse: (!) 108 97 93 92  Resp: 20 18 (!) 23 (!) 31  Temp:      TempSrc:      SpO2: 99% 98% 96% 95%  Weight:      Height:        Constitutional: NAD, calm, comfortable.  Currently on room air.  Looks chronically ill and deconditioned. Vitals:   04/25/22 1230 04/25/22 1330 04/25/22 1430 04/25/22 1445  BP: 125/72 (!) 140/65 (!) 134/98 (!) 108/58  Pulse: (!) 108 97 93 92  Resp: 20 18 (!) 23 (!) 31  Temp:      TempSrc:      SpO2: 99% 98% 96% 95%  Weight:      Height:       Eyes: PERRL, lids and conjunctivae normal ENMT: Mucous membranes are dry.  Posterior pharynx clear of any exudate or lesions. Neck: normal, supple, no masses, no thyromegaly Respiratory: bilateral decreased breath sounds at bases, with some scattered crackles and intermittent tachypnea. No accessory muscle use.  Cardiovascular: S1 S2 positive, mild intermittent tachycardia present.  No extremity edema. 2+ pedal pulses.  Abdomen: no tenderness, no masses  palpated. No hepatosplenomegaly. Bowel sounds positive.  Musculoskeletal: no clubbing / cyanosis. No joint deformity upper and lower extremities.  Skin: no rashes, lesions, ulcers. No induration Neurologic: Awake, slow to respond, no focal neurologic deficits.  Moving extremities. Psychiatric: Flat affect.  Not agitated.   Labs on Admission: I have personally reviewed following labs and imaging studies  CBC: Recent Labs  Lab 04/25/22 1146  WBC 17.8*  NEUTROABS 13.0*  HGB 12.8  HCT 39.6  MCV 108.5*  PLT Q000111Q   Basic Metabolic Panel: Recent Labs  Lab 04/25/22 1146  NA 141  K 3.5  CL 105  CO2 24  GLUCOSE 97  BUN 29*  CREATININE 1.81*  CALCIUM 9.3   GFR: Estimated Creatinine Clearance: 23 mL/min (A) (by C-G formula based on SCr of 1.81 mg/dL (H)). Liver Function Tests: Recent Labs  Lab 04/25/22 1146  AST 38  ALT 25  ALKPHOS 53  BILITOT 1.2  PROT 7.6  ALBUMIN 3.5   No results for input(s): "LIPASE", "AMYLASE" in the last 168 hours. No results for input(s): "AMMONIA" in the last 168 hours. Coagulation Profile: Recent Labs  Lab 04/25/22 1146  INR 1.2   Cardiac Enzymes: No results for input(s): "CKTOTAL", "CKMB", "CKMBINDEX", "TROPONINI" in the last 168 hours. BNP (last 3 results) No results for input(s): "PROBNP" in the last 8760 hours. HbA1C: No results for input(s): "HGBA1C" in the last 72 hours. CBG: Recent Labs  Lab 04/25/22 1140  GLUCAP 102*   Lipid Profile: No results for input(s): "CHOL", "HDL", "LDLCALC", "TRIG", "CHOLHDL", "LDLDIRECT" in the last 72 hours. Thyroid Function Tests: No results for input(s): "TSH", "T4TOTAL", "FREET4", "T3FREE", "THYROIDAB" in the last 72 hours. Anemia Panel: No results for input(s): "VITAMINB12", "FOLATE", "FERRITIN", "TIBC", "IRON", "RETICCTPCT" in the last 72 hours. Urine analysis:    Component Value Date/Time   COLORURINE YELLOW 04/25/2022 1231   APPEARANCEUR CLEAR 04/25/2022 1231   LABSPEC 1.017  04/25/2022 1231   PHURINE 7.0 04/25/2022 1231   GLUCOSEU 150 (A) 04/25/2022 1231   HGBUR NEGATIVE 04/25/2022 1231   BILIRUBINUR NEGATIVE 04/25/2022 1231   KETONESUR NEGATIVE 04/25/2022 1231   PROTEINUR 30 (A) 04/25/2022 1231   UROBILINOGEN 0.2 05/17/2014 1113   NITRITE NEGATIVE 04/25/2022 1231   LEUKOCYTESUR NEGATIVE 04/25/2022 1231    Radiological Exams on Admission: CT ABDOMEN PELVIS WO CONTRAST  Result Date: 04/25/2022 CLINICAL DATA:  Abdominal pain EXAM: CT ABDOMEN AND PELVIS WITHOUT CONTRAST TECHNIQUE: Multidetector CT imaging of the abdomen and pelvis was performed following the standard protocol without IV contrast. RADIATION DOSE REDUCTION: This exam was performed according to the departmental dose-optimization program which includes automated exposure control, adjustment of the mA and/or kV according to patient size and/or use of iterative reconstruction technique. COMPARISON:  10/04/2020 FINDINGS: Lower chest: Moderate patchy infiltrates are seen in right lower lung fields. Small patchy infiltrates are seen in left lower lung field. There is no pleural effusion. Hepatobiliary: Surgical clips are seen in gallbladder fossa. There is no dilation of bile ducts. Pancreas: There is fatty infiltration. Spleen: Spleen is not enlarged. Adrenals/Urinary Tract: Adrenals are unremarkable. There is no hydronephrosis. There are no renal or ureteral stones. Urinary bladder is unremarkable. Stomach/Bowel: Small hiatal hernia is seen. Small bowel loops are not dilated. Appendix is not distinctly visualized. There is no pericecal inflammation. There is diffuse wall thickening in rectum. There is stranding in the perirectal fat planes without any loculated fluid collections. Vascular/Lymphatic: Scattered calcifications are seen in aorta and its major branches including coronary arteries. Reproductive: Uterus is not seen. Other: There is no ascites or pneumoperitoneum. There are coarse calcifications in left  breast. Musculoskeletal: Degenerative changes are noted in lumbar spine, more severe at L4-L5 level with spinal stenosis and encroachment of neural foramina. IMPRESSION: There is abnormal diffuse wall thickening in rectum along with perirectal stranding. Findings suggest acute proctitis. Possibility of underlying neoplastic process is not excluded. When the patient's clinical condition  permits, endoscopy should be considered. There are patchy infiltrates in both lower lung fields, more so on the right side suggesting atelectasis/pneumonia. There is no evidence of intestinal obstruction or pneumoperitoneum. There is no hydronephrosis. Arteriosclerosis.  Lumbar spondylosis. Other findings as described in the body of the report. Electronically Signed   By: Elmer Picker M.D.   On: 04/25/2022 14:25   CT Head Wo Contrast  Result Date: 04/25/2022 CLINICAL DATA:  Altered mental status EXAM: CT HEAD WITHOUT CONTRAST TECHNIQUE: Contiguous axial images were obtained from the base of the skull through the vertex without intravenous contrast. RADIATION DOSE REDUCTION: This exam was performed according to the departmental dose-optimization program which includes automated exposure control, adjustment of the mA and/or kV according to patient size and/or use of iterative reconstruction technique. COMPARISON:  02/23/2020 FINDINGS: Brain: No acute intracranial findings are seen. There are no signs of bleeding within the cranium. Cortical sulci are prominent. There is decreased density in periventricular and subcortical white matter. Vascular: Unremarkable. Skull: No fracture is seen in calvarium. Sinuses/Orbits: There is mucosal thickening in the ethmoid sinus. Other: None. IMPRESSION: No acute intracranial findings are seen in noncontrast CT brain. Atrophy. Small-vessel disease. Electronically Signed   By: Elmer Picker M.D.   On: 04/25/2022 14:15   DG Chest Port 1 View  Result Date: 04/25/2022 CLINICAL DATA:   Fever EXAM: PORTABLE CHEST 1 VIEW COMPARISON:  X-ray 02/04/2021.  CT 04/16/2022 FINDINGS: Enlarged cardiopericardial silhouette with vascular congestion. No pneumothorax, effusion. No consolidation. Overlapping cardiac leads. Film is under penetrated. Fixation hardware along the right shoulder and cervical spine at the edge of the imaging field. IMPRESSION: Enlarged cardiopericardial silhouette with vascular congestion. Electronically Signed   By: Jill Side M.D.   On: 04/25/2022 13:10     Assessment/Plan  Sepsis: Present on admission Possible bilateral community-acquired pneumonia Possible acute proctitis -Presented with altered mental status, fever, diarrhea, tachycardia, tachypnea, leukocytosis with pneumonia and possible proctitis -Received cefepime, Flagyl, vancomycin in the ED.  Will continue Rocephin, Zithromax and Flagyl.  COVID-19/influenza/RSV PCR negative on presentation.  Follow cultures. -Currently on room air. -Will hold off on further IV fluids because of history of cardiomyopathy.  Chest x-ray on presentation showed vascular congestion.  Leukocytosis -Plan as above.  Repeat a.m. labs  Acute metabolic encephalopathy -Possibly from above.  CT of the brain negative for acute intracranial abnormality.  Fall precautions. -Monitor mental status.  PT eval.  Chronic kidney disease stage IIIb -Creatinine at baseline.  Repeat a.m. labs.  Chronic systolic heart failure Hypertension -Last echo on 05/26/2018 had shown EF of 30 to 35%.  Not fluid overloaded at this time.  Strict input and output Daily weights.  Fluid restriction.  Hold Lasix for now.  Will possibly resume in AM.  Outpatient follow-up with cardiology. -Hold isosorbide hydralazine and ARB for today since BP is intermittently on the lower side.  -Will continue gentle hydration  Mildly positive troponins -Possible from sepsis/pneumonia.  No chest pain.  Will trend troponins.  I have reviewed the EKG done on presentation  myself: No signs of ischemia.  No ST elevations or depressions.  Asthma Bronchiectasis -Continue current inhaled/nebulized regimen.  Currently on room air.  Outpatient follow-up with pulmonary. -Currently montelukast  History of DVT/PE -Continue Xarelto  History of seronegative rheumatoid arthritis -Outpatient follow-up with rheumatology.  Hold Plaquenil and prednisone.  Will possibly resume prednisone in a.m.  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   DVT prophylaxis: Xarelto Code Status: Full Family Communication: Daughter  at bedside Disposition Plan: Home in 1 to 2 days if clinically improves Consults called: None Admission status: Observation/telemetry  Severity of Illness: The appropriate patient status for this patient is OBSERVATION. Observation status is judged to be reasonable and necessary in order to provide the required intensity of service to ensure the patient's safety. The patient's presenting symptoms, physical exam findings, and initial radiographic and laboratory data in the context of their medical condition is felt to place them at decreased risk for further clinical deterioration. Furthermore, it is anticipated that the patient will be medically stable for discharge from the hospital within 2 midnights of admission.     Aline August MD [facility]  04/25/2022, 2:57 PM

## 2022-04-25 NOTE — ED Notes (Signed)
ED TO INPATIENT HANDOFF REPORT   S Name/Age/Gender Ebony Scott 72 y.o. female Room/Bed: WA17/WA17  Code Status   Code Status: Full Code  Home/SNF/Other Home Patient oriented to: self Is this baseline? No   Triage Complete: Triage complete  Chief Complaint Pneumonia [J18.9]  Triage Note Per [facility] pt coming from home- family found patient sitting on couch this morning with altered mental status. Patient alert to self but nothing else. EMS states patient agitated en route trying to pull IV out and get up. Patient also had a mechanical fall last night landing on an ottoman. Ems states pt landed on her right side but did not hit head. Patient hot to touch.    350 CC NS given en route.    Allergies Allergies  Allergen Reactions   Baclofen Anaphylaxis and Other (See Comments)    Altered mental status requiring a 3-day hospital stay- patient became unresponsive   "Put me in a coma for five days", Altered mental status requiring a 3-day hospital stay- patient became unresponsive   Dust Mite Extract Shortness Of Breath and Other (See Comments)    "sneezing" (02/20/2012) too   Molds & Smuts Shortness Of Breath   Morphine And Related Hives and Itching   Other Shortness Of Breath and Other (See Comments)    Grass and weeds "sneezing; filled sinuses" (02/20/2012)   Penicillins Rash and Other (See Comments)    "welts" (02/20/2012)  Tolerated Ancef 02/16/20   Rofecoxib Swelling and Other (See Comments)    Vioxx- feet swelling   Shellfish Allergy Anaphylaxis, Shortness Of Breath, Itching and Rash   Shrimp Extract Allergy Skin Test Anaphylaxis and Other (See Comments)    ALL SHELLFISH   Tetracycline Hcl Nausea And Vomiting   Tetracycline Hcl Other (See Comments)    GI Intolerance   Xolair [Omalizumab] Other (See Comments)    Caused Blood clot   Zoledronic Acid Other (See Comments)    Reclast- Fever, Put in hospital, dr said it was a reaction from a reaction    Fever, inability to  walk, Put in hospital  Fever, Put in hospital, dr said it was a reaction from a reaction , Other reaction(s): Unknown   Dilaudid [Hydromorphone Hcl] Itching   Hydrocodone-Acetaminophen Itching   Levofloxacin Other (See Comments)    GI upset  Other Reaction(s): GI Intolerance, Other (See Comments)  REACTION: GI upset, GI upset   Oxycodone Hcl Nausea And Vomiting   Oxycodone Hcl Other (See Comments)    GI Intolerance   Paroxetine Nausea And Vomiting and Other (See Comments)    GI Intolerance also   Celecoxib Swelling and Other (See Comments)    Feet swelling   Diltiazem Hcl Swelling   Lactose Other (See Comments)    Bloating and gas   Lactose Intolerance (Gi) Other (See Comments)    Bloating and gas   Oxycodone-Acetaminophen Itching   Rituximab Other (See Comments)    Anemia    Tree Extract Other (See Comments)    "tested and told I was allergic to it; never experienced a reaction to it" (02/20/2012)   Gamunex [Immune Globulin] Itching and Rash    Tolerates hizentra    Penicillin G Procaine Rash and Other (See Comments)    Welts, also    Level of Care/Admitting Diagnosis ED Disposition     ED Disposition  Admit   Condition  --   [facility]: [place] [[id number]]  Level of Care: Telemetry [5]  Admit to tele based on following criteria: Other see comments  Comments: Sepsis  May place patient in observation at [facility] or [facility] if equivalent level of care is available:: Yes  Covid Evaluation: Confirmed COVID Negative  Diagnosis: Pneumonia [227785]  Admitting Physician: Aline August [id number]  Attending Physician: Aline August [id number]          B Medical/Surgery History Past Medical History:  Diagnosis Date   Abscess of dorsum of right hand 09/22/2020   Acute deep vein thrombosis (DVT) of right lower extremity ([place]) 12/13/2017   Allergic rhinitis    Anemia    Angio-edema    Anxiety    pt denies dx   Arthritis     Phreesia 10/21/2019   Asthma    Phreesia 10/21/2019   Breast cancer ([place]) 1998   Left breast, in remission   Bronchiectasis ([place])    Cataract    REMOVED   CHF (congestive heart failure) ([place])    Clostridium difficile colitis [id number]   Complication of anesthesia    "had hard time waking up from it several times" (02/20/2012), no anesthesia problems since 2014   Coronary artery disease    Depression    "some; don't take anything for it" (02/20/2012), pt denies dx as of 06/02/21   Diverticulosis    DVT (deep venous thrombosis) ([place])    Dyspnea    all the time   Fibromyalgia 11/2011   GAVE (gastric antral vascular ectasia)    GERD (gastroesophageal reflux disease)    Graves disease    Headache(784.0)    "related to allergies; more at different times during the year" (02/20/2012)   Hemorrhoids    Hiatal hernia    back and neck   History of blood transfusion 2023   1 unit, 5 units of Iron   Hx of adenomatous colonic polyps 04/12/2016   Hypercholesteremia    good cholesterol is high   Hypothyroidism    IBS (irritable bowel syndrome)    Moderate persistent asthma    -FeV1 72% 2011, -IgE 102 2011, CT sinus Neg 2011   Osteomyelitis of second toe of right foot ([place]) 09/23/2020   Osteoporosis    on reclast yearly   Peripheral vascular disease ([place]) 2019   DVTs (3) lungs and leg   Pneumonia 04/2011; ~ 11/2011   "double; single" (02/20/2012)   Septic olecranon bursitis of right elbow 09/22/2020   Seronegative rheumatoid arthritis ([place])    Dr. Lahoma Rocker   SIRS (systemic inflammatory response syndrome) ([place]) 02/10/2018   Sleep apnea    uses cpap nightly   Tracheobronchomalacia    Past Surgical History:  Procedure Laterality Date   ABDOMINAL HYSTERECTOMY N/A    Phreesia 10/21/2019   ANTERIOR AND POSTERIOR REPAIR  1990's   APPENDECTOMY     ARGON LASER APPLICATION  [id number]   Procedure: ARGON LASER APPLICATION;  Surgeon: Yetta Flock, MD;  [facility];   Service: Gastroenterology;;   BREAST LUMPECTOMY  1998   left   BREAST SURGERY N/A    Phreesia 10/21/2019   BRONCHIAL WASHINGS  04/05/2020   Procedure: BRONCHIAL WASHINGS;  Surgeon: Freddi Starr, MD;  Location: [facility];  Service: Pulmonary;;   CAPSULOTOMY Right 08/28/2021   Procedure: CAPSULOTOMY;  Surgeon: Felipa Furnace, DPM;  Location: [facility];  Service: Podiatry;  Laterality: Right;   CARPOMETACARPEL ([place]) FUSION OF THUMB WITH AUTOGRAFT FROM RADIUS  ~ 2009   "both thumbs" (02/20/2012)   CATARACT EXTRACTION W/ INTRAOCULAR LENS  IMPLANT, BILATERAL  2012   CERVICAL DISCECTOMY  10/2001   C5-C6   CERVICAL FUSION  2003   C3-C4   CHOLECYSTECTOMY     COLONOSCOPY     CORONARY STENT INTERVENTION N/A 01/11/2021   Procedure: CORONARY STENT INTERVENTION;  Surgeon: Leonie Man, MD;  Location: [facility];  Service: Cardiovascular;  Laterality: N/A;   DEBRIDEMENT TENNIS ELBOW  ?1970's   right   ESOPHAGOGASTRODUODENOSCOPY     ESOPHAGOGASTRODUODENOSCOPY (EGD) WITH PROPOFOL N/A 02/05/2021   Procedure: ESOPHAGOGASTRODUODENOSCOPY (EGD) WITH PROPOFOL;  Surgeon: Yetta Flock, MD;  [facility];  Service: Gastroenterology;  Laterality: N/A;   EYE SURGERY N/A    Phreesia 10/21/2019   HAMMER TOE SURGERY Right 08/28/2021   Procedure: HAMMER TOE REPAIR 2ND TOE RIGHT FOOT;  Surgeon: Felipa Furnace, DPM;  Location: [facility];  Service: Podiatry;  Laterality: Right;   HYSTERECTOMY     I & D EXTREMITY Right 09/22/2020   Procedure: IRRIGATION AND DEBRIDEMENT RIGHT HAND AND ELBOW;  Surgeon: Marchia Bond, MD;  [facility];  Service: Orthopedics;  Laterality: Right;   KNEE ARTHROPLASTY  ?1990's   "?right; w/cartilage repair" (02/20/2012)   MASS EXCISION Left 08/28/2021   Procedure: EXCISION OF SOFT TISSUE  MASS LEFT FOOT;  Surgeon: Felipa Furnace, DPM;  Location: [facility];  Service: Podiatry;  Laterality: Left;   NASAL SEPTUM SURGERY  1980's   POSTERIOR CERVICAL FUSION/FORAMINOTOMY   2004   "failed initial fusion; rewired  anterior neck" (02/20/2012)   REVERSE SHOULDER ARTHROPLASTY Right 02/16/2020   Procedure: REVERSE SHOULDER ARTHROPLASTY;  Surgeon: Marchia Bond, MD;  [facility];  Service: Orthopedics;  Laterality: Right;   RIGHT/LEFT HEART CATH AND CORONARY ANGIOGRAPHY N/A 01/11/2021   Procedure: RIGHT/LEFT HEART CATH AND CORONARY ANGIOGRAPHY;  Surgeon: Leonie Man, MD;  Location: [facility];  Service: Cardiovascular;  Laterality: N/A;   SPINE SURGERY N/A    Phreesia 10/21/2019   TONSILLECTOMY  ~ 1953   VESICOVAGINAL FISTULA CLOSURE W/ TAH  1988   VIDEO BRONCHOSCOPY Bilateral 08/23/2016   Procedure: VIDEO BRONCHOSCOPY WITH FLUORO;  Surgeon: Javier Glazier, MD;  [facility];  Service: Cardiopulmonary;  Laterality: Bilateral;   VIDEO BRONCHOSCOPY N/A 04/05/2020   Procedure: VIDEO BRONCHOSCOPY WITHOUT FLUORO;  Surgeon: Freddi Starr, MD;  [facility];  Service: Pulmonary;  Laterality: N/A;     A IV Location/Drains/Wounds Patient Lines/Drains/Airways Status     Active Line/Drains/Airways     Name Placement date Placement time Site Days   Peripheral IV 04/25/22 20 G 1" Right Antecubital 04/25/22  1202  Antecubital  less than 1   Peripheral IV 04/25/22 18 G 1" Left Antecubital 04/25/22  1202  Antecubital  less than 1   External Urinary Catheter 09/24/20  1254  --  578   Wound / Incision (Open or Dehisced) 02/26/20 (MASD) Moisture Associated Skin Damage Buttocks Bilateral Redness, excoration 02/26/20  2030  Buttocks  789   Wound / Incision (Open or Dehisced) 09/22/20 Other (Comment) Toe (Comment  which one) Anterior;Right Broken toe s/p surgery 09/22/20  2350  Toe (Comment  which one)  580            Intake/Output Last 24 hours  Intake/Output Summary (Last 24 hours) at 04/25/2022 1512 Last data filed at 04/25/2022 1344 Gross per 24 hour  Intake 85.33 ml  Output --  Net 85.33 ml    Labs/Imaging Results for orders  placed or performed during the  hospital encounter of 04/25/22 (from the past 48 hour(s))  CBG monitoring, ED     Status: Abnormal   Collection Time: 04/25/22 11:40 AM  Result Value Ref Range   Glucose-Capillary 102 (H) 70 - 99 mg/dL    Comment: Glucose reference range applies only to samples taken after fasting for at least 8 hours.  Lactic acid, plasma     Status: None   Collection Time: 04/25/22 11:46 AM  Result Value Ref Range   Lactic Acid, Venous 1.9 0.5 - 1.9 mmol/L    Comment: Performed at [facility], [place] [address] [place], [place] [address]  CBC with Differential     Status: Abnormal   Collection Time: 04/25/22 11:46 AM  Result Value Ref Range   WBC 17.8 (H) 4.0 - 10.5 K/uL   RBC 3.65 (L) 3.87 - 5.11 MIL/uL   Hemoglobin 12.8 12.0 - 15.0 g/dL   HCT 39.6 36.0 - 46.0 %   MCV 108.5 (H) 80.0 - 100.0 fL   MCH 35.1 (H) 26.0 - 34.0 pg   MCHC 32.3 30.0 - 36.0 g/dL   RDW 15.3 11.5 - 15.5 %   Platelets 182 150 - 400 K/uL   nRBC 0.0 0.0 - 0.2 %   Neutrophils Relative % 72 %   Neutro Abs 13.0 (H) 1.7 - 7.7 K/uL   Lymphocytes Relative 10 %   Lymphs Abs 1.8 0.7 - 4.0 K/uL   Monocytes Relative 14 %   Monocytes Absolute 2.4 (H) 0.1 - 1.0 K/uL   Eosinophils Relative 3 %   Eosinophils Absolute 0.5 0.0 - 0.5 K/uL   Basophils Relative 0 %   Basophils Absolute 0.1 0.0 - 0.1 K/uL   Immature Granulocytes 1 %   Abs Immature Granulocytes 0.12 (H) 0.00 - 0.07 K/uL    Comment: Performed at [facility], [place] [address], [place], [place] [address]  Comprehensive metabolic panel     Status: Abnormal   Collection Time: 04/25/22 11:46 AM  Result Value Ref Range   Sodium 141 135 - 145 mmol/L   Potassium 3.5 3.5 - 5.1 mmol/L   Chloride 105 98 - 111 mmol/L   CO2 24 22 - 32 mmol/L   Glucose, Bld 97 70 - 99 mg/dL    Comment: Glucose reference range applies only to samples taken after fasting for at least 8 hours.   BUN 29 (H) 8 - 23 mg/dL   Creatinine, Ser  1.81 (H) 0.44 - 1.00 mg/dL   Calcium 9.3 8.9 - 10.3 mg/dL   Total Protein 7.6 6.5 - 8.1 g/dL   Albumin 3.5 3.5 - 5.0 g/dL   AST 38 15 - 41 U/L   ALT 25 0 - 44 U/L   Alkaline Phosphatase 53 38 - 126 U/L   Total Bilirubin 1.2 0.3 - 1.2 mg/dL   GFR, Estimated 29 (L) >60 mL/min    Comment: (NOTE) Calculated using the CKD-EPI Creatinine Equation (2021)    Anion gap 12 5 - 15    Comment: Performed at [facility], [place] [address] [place], [place] [address]  Troponin I (High Sensitivity)     Status: Abnormal   Collection Time: 04/25/22 11:46 AM  Result Value Ref Range   Troponin I (High Sensitivity) 112 (HH) <18 ng/L    Comment: CRITICAL RESULT CALLED TO, READ BACK BY AND VERIFIED WITH KATHY, R. RN AT 1256 ON 04/25/2022 BY MECIAL J. (NOTE) Elevated high sensitivity troponin I (hsTnI) values and significant  changes across serial measurements may suggest ACS but many other  chronic and acute conditions are known to elevate hsTnI results.  Refer to the "Links" section for chest pain algorithms and additional  guidance. Performed at [facility], [place] [address] [place], [place] [address]   Brain natriuretic peptide     Status: Abnormal   Collection Time: 04/25/22 11:46 AM  Result Value Ref Range   B Natriuretic Peptide 1,049.7 (H) 0.0 - 100.0 pg/mL    Comment: Performed at [facility], [place] [address] [place], [place] [address]  Type and screen [facility]     Status: None   Collection Time: 04/25/22 11:46 AM  Result Value Ref Range   ABO/RH(D) A POS    Antibody Screen NEG    Sample Expiration      04/28/2022,2359 Performed at [facility], [place] [address], [place], [place] [address]   Protime-INR     Status: None   Collection Time: 04/25/22 11:46 AM  Result Value Ref Range   Prothrombin Time 14.6 11.4 - 15.2 seconds   INR 1.2 0.8 - 1.2    Comment: (NOTE) INR goal varies based on  device and disease states. Performed at [facility], [place] [address], [place], [place] [address]   Resp panel by RT-PCR (RSV, Flu A&B, Covid) Anterior Nasal Swab     Status: None   Collection Time: 04/25/22 11:47 AM   Specimen: Anterior Nasal Swab  Result Value Ref Range   SARS Coronavirus 2 by RT PCR NEGATIVE NEGATIVE    Comment: (NOTE) SARS-CoV-2 target nucleic acids are NOT DETECTED.  The SARS-CoV-2 RNA is generally detectable in upper respiratory specimens during the acute phase of infection. The lowest concentration of SARS-CoV-2 viral copies this assay can detect is 138 copies/mL. A negative result does not preclude SARS-Cov-2 infection and should not be used as the sole basis for treatment or other patient management decisions. A negative result may occur with  improper specimen collection/handling, submission of specimen other than nasopharyngeal swab, presence of viral mutation(s) within the areas targeted by this assay, and inadequate number of viral copies(<138 copies/mL). A negative result must be combined with clinical observations, patient history, and epidemiological information. The expected result is Negative.  Fact Sheet for Patients:  EntrepreneurPulse.com.au  Fact Sheet for Healthcare Providers:  IncredibleEmployment.be  This test is no t yet approved or cleared by the [place] FDA and  has been authorized for detection and/or diagnosis of SARS-CoV-2 by FDA under an Emergency Use Authorization (EUA). This EUA will remain  in effect (meaning this test can be used) for the duration of the COVID-19 declaration under Section 564(b)(1) of the Act, 21 U.S.C.section 360bbb-3(b)(1), unless the authorization is terminated  or revoked sooner.       Influenza A by PCR NEGATIVE NEGATIVE   Influenza B by PCR NEGATIVE NEGATIVE    Comment: (NOTE) The Xpert Xpress SARS-CoV-2/FLU/RSV plus assay is intended as an  aid in the diagnosis of influenza from Nasopharyngeal swab specimens and should not be used as a sole basis for treatment. Nasal washings and aspirates are unacceptable for Xpert Xpress SARS-CoV-2/FLU/RSV testing.  Fact Sheet for Patients: EntrepreneurPulse.com.au  Fact Sheet for Healthcare Providers: IncredibleEmployment.be  This test is not yet approved or cleared by the [place] FDA and has been authorized for detection and/or diagnosis of SARS-CoV-2 by FDA under an Emergency Use Authorization (EUA). This EUA will remain in effect (meaning  this test can be used) for the duration of the COVID-19 declaration under Section 564(b)(1) of the Act, 21 U.S.C. section 360bbb-3(b)(1), unless the authorization is terminated or revoked.     Resp Syncytial Virus by PCR NEGATIVE NEGATIVE    Comment: (NOTE) Fact Sheet for Patients: EntrepreneurPulse.com.au  Fact Sheet for Healthcare Providers: IncredibleEmployment.be  This test is not yet approved or cleared by the [place] FDA and has been authorized for detection and/or diagnosis of SARS-CoV-2 by FDA under an Emergency Use Authorization (EUA). This EUA will remain in effect (meaning this test can be used) for the duration of the COVID-19 declaration under Section 564(b)(1) of the Act, 21 U.S.C. section 360bbb-3(b)(1), unless the authorization is terminated or revoked.  Performed at [facility], [place] [address] [place], [place] [address]   Urinalysis, w/ Reflex to Culture (Infection Suspected) -Urine, Clean Catch     Status: Abnormal   Collection Time: 04/25/22 12:31 PM  Result Value Ref Range   Specimen Source URINE, CLEAN CATCH    Color, Urine YELLOW YELLOW   APPearance CLEAR CLEAR   Specific Gravity, Urine 1.017 1.005 - 1.030   pH 7.0 5.0 - 8.0   Glucose, UA 150 (A) NEGATIVE mg/dL   Hgb urine dipstick NEGATIVE NEGATIVE    Bilirubin Urine NEGATIVE NEGATIVE   Ketones, ur NEGATIVE NEGATIVE mg/dL   Protein, ur 30 (A) NEGATIVE mg/dL   Nitrite NEGATIVE NEGATIVE   Leukocytes,Ua NEGATIVE NEGATIVE   RBC / HPF 0-5 0 - 5 RBC/hpf   WBC, UA 0-5 0 - 5 WBC/hpf    Comment:        Reflex urine culture not performed if WBC <=10, OR if Squamous epithelial cells >5. If Squamous epithelial cells >5 suggest recollection.    Bacteria, UA NONE SEEN NONE SEEN   Squamous Epithelial / HPF 0-5 0 - 5 /HPF   Mucus PRESENT     Comment: Performed at [facility], [place] [address] [place], [place] [address]   *Note: Due to a large number of results and/or encounters for the requested time period, some results have not been displayed. A complete set of results can be found in Results Review.   CT ABDOMEN PELVIS WO CONTRAST  Result Date: 04/25/2022 CLINICAL DATA:  Abdominal pain EXAM: CT ABDOMEN AND PELVIS WITHOUT CONTRAST TECHNIQUE: Multidetector CT imaging of the abdomen and pelvis was performed following the standard protocol without IV contrast. RADIATION DOSE REDUCTION: This exam was performed according to the departmental dose-optimization program which includes automated exposure control, adjustment of the mA and/or kV according to patient size and/or use of iterative reconstruction technique. COMPARISON:  10/04/2020 FINDINGS: Lower chest: Moderate patchy infiltrates are seen in right lower lung fields. Small patchy infiltrates are seen in left lower lung field. There is no pleural effusion. Hepatobiliary: Surgical clips are seen in gallbladder fossa. There is no dilation of bile ducts. Pancreas: There is fatty infiltration. Spleen: Spleen is not enlarged. Adrenals/Urinary Tract: Adrenals are unremarkable. There is no hydronephrosis. There are no renal or ureteral stones. Urinary bladder is unremarkable. Stomach/Bowel: Small hiatal hernia is seen. Small bowel loops are not dilated. Appendix is not distinctly visualized.  There is no pericecal inflammation. There is diffuse wall thickening in rectum. There is stranding in the perirectal fat planes without any loculated fluid collections. Vascular/Lymphatic: Scattered calcifications are seen in aorta and its major branches including coronary arteries. Reproductive: Uterus is not seen. Other: There is no ascites or pneumoperitoneum. There are  coarse calcifications in left breast. Musculoskeletal: Degenerative changes are noted in lumbar spine, more severe at L4-L5 level with spinal stenosis and encroachment of neural foramina. IMPRESSION: There is abnormal diffuse wall thickening in rectum along with perirectal stranding. Findings suggest acute proctitis. Possibility of underlying neoplastic process is not excluded. When the patient's clinical condition permits, endoscopy should be considered. There are patchy infiltrates in both lower lung fields, more so on the right side suggesting atelectasis/pneumonia. There is no evidence of intestinal obstruction or pneumoperitoneum. There is no hydronephrosis. Arteriosclerosis.  Lumbar spondylosis. Other findings as described in the body of the report. Electronically Signed   By: Elmer Picker M.D.   On: 04/25/2022 14:25   CT Head Wo Contrast  Result Date: 04/25/2022 CLINICAL DATA:  Altered mental status EXAM: CT HEAD WITHOUT CONTRAST TECHNIQUE: Contiguous axial images were obtained from the base of the skull through the vertex without intravenous contrast. RADIATION DOSE REDUCTION: This exam was performed according to the departmental dose-optimization program which includes automated exposure control, adjustment of the mA and/or kV according to patient size and/or use of iterative reconstruction technique. COMPARISON:  02/23/2020 FINDINGS: Brain: No acute intracranial findings are seen. There are no signs of bleeding within the cranium. Cortical sulci are prominent. There is decreased density in periventricular and subcortical white  matter. Vascular: Unremarkable. Skull: No fracture is seen in calvarium. Sinuses/Orbits: There is mucosal thickening in the ethmoid sinus. Other: None. IMPRESSION: No acute intracranial findings are seen in noncontrast CT brain. Atrophy. Small-vessel disease. Electronically Signed   By: Elmer Picker M.D.   On: 04/25/2022 14:15   DG Chest Port 1 View  Result Date: 04/25/2022 CLINICAL DATA:  Fever EXAM: PORTABLE CHEST 1 VIEW COMPARISON:  X-ray 02/04/2021.  CT 04/16/2022 FINDINGS: Enlarged cardiopericardial silhouette with vascular congestion. No pneumothorax, effusion. No consolidation. Overlapping cardiac leads. Film is under penetrated. Fixation hardware along the right shoulder and cervical spine at the edge of the imaging field. IMPRESSION: Enlarged cardiopericardial silhouette with vascular congestion. Electronically Signed   By: Jill Side M.D.   On: 04/25/2022 13:10    Pending Labs Unresulted Labs (From admission, onward)     Start     Ordered   04/26/22 0500  CBC  Tomorrow morning,   R        04/25/22 1454   04/26/22 0500  Comprehensive metabolic panel  Tomorrow morning,   R        04/25/22 1454   04/25/22 1453  Expectorated Sputum Assessment w Gram Stain, Rflx to Resp Cult  (COPD / Pneumonia / Cellulitis / Lower Extremity Wound)  Once,   R        04/25/22 1454   04/25/22 1451  Legionella Pneumophila Serogp 1 Ur Ag  (COPD / Pneumonia / Cellulitis / Lower Extremity Wound)  Once,   R        04/25/22 1454   04/25/22 1451  Strep pneumoniae urinary antigen  (COPD / Pneumonia / Cellulitis / Lower Extremity Wound)  Once,   R        04/25/22 1454   04/25/22 1146  Culture, blood (routine x 2)  BLOOD CULTURE X 2,   R (with STAT occurrences)      04/25/22 1147   04/25/22 1146  Lactic acid, plasma  Now then every 2 hours,   R (with STAT occurrences)      04/25/22 1147            Vitals/Pain Today's Vitals  04/25/22 1330 04/25/22 1430 04/25/22 1445 04/25/22 1507  BP: (!) 140/65  (!) 134/98 (!) 108/58   Pulse: 97 93 92 92  Resp: 18 (!) 23 (!) 31 (!) 22  Temp:    (!) 101.9 F (38.8 C)  TempSrc:    Oral  SpO2: 98% 96% 95% 96%  Weight:      Height:        Isolation Precautions No active isolations  Medications Medications  allopurinol (ZYLOPRIM) tablet 100 mg (has no administration in time range)  atorvastatin (LIPITOR) tablet 40 mg (has no administration in time range)  isosorbide-hydrALAZINE (BIDIL) 20-37.5 MG per tablet 1 tablet (has no administration in time range)  irbesartan (AVAPRO) tablet 37.5 mg (has no administration in time range)  famotidine (PEPCID) tablet 20 mg (has no administration in time range)  clopidogrel (PLAVIX) tablet 75 mg (has no administration in time range)  folic acid (FOLVITE) tablet 1 mg (has no administration in time range)  rivaroxaban (XARELTO) tablet 20 mg (has no administration in time range)  gabapentin (NEURONTIN) capsule 300 mg (has no administration in time range)  benzonatate (TESSALON) capsule 200 mg (has no administration in time range)  fluticasone (FLONASE) 50 MCG/ACT nasal spray 2 spray (has no administration in time range)  guaiFENesin (MUCINEX) 12 hr tablet 600 mg (has no administration in time range)  ipratropium-albuterol (DUONEB) 0.5-2.5 (3) MG/3ML nebulizer solution 3 mL (has no administration in time range)  montelukast (SINGULAIR) tablet 10 mg (has no administration in time range)  arformoterol (BROVANA) nebulizer solution 15 mcg (has no administration in time range)    And  umeclidinium bromide (INCRUSE ELLIPTA) 62.5 MCG/ACT 1 puff (1 puff Inhalation Not Given 04/25/22 1502)  diclofenac Sodium (VOLTAREN) 1 % topical gel 1 Application (has no administration in time range)  Propylene Glycol 0.6 % SOLN 1 drop (has no administration in time range)  cefTRIAXone (ROCEPHIN) 2 g in sodium chloride 0.9 % 100 mL IVPB (has no administration in time range)  azithromycin (ZITHROMAX) 500 mg in sodium chloride 0.9 % 250 mL  IVPB (has no administration in time range)  metroNIDAZOLE (FLAGYL) IVPB 500 mg (has no administration in time range)  traMADol (ULTRAM) tablet 50 mg (has no administration in time range)  ondansetron (ZOFRAN) tablet 4 mg (has no administration in time range)    Or  ondansetron (ZOFRAN) injection 4 mg (has no administration in time range)  acetaminophen (TYLENOL) suppository 650 mg (650 mg Rectal Given 04/25/22 1153)  sodium chloride 0.9 % bolus 500 mL (0 mLs Intravenous Stopped 04/25/22 1327)  ceFEPIme (MAXIPIME) 2 g in sodium chloride 0.9 % 100 mL IVPB (0 g Intravenous Stopped 04/25/22 1239)  vancomycin (VANCOREADY) IVPB 1500 mg/300 mL (1,500 mg Intravenous New Bag/Given 04/25/22 1310)  metroNIDAZOLE (FLAGYL) IVPB 500 mg (0 mg Intravenous Stopped 04/25/22 1344)    Mobility Walks usually but today altered              R Recommendations: See Admitting Provider Note

## 2022-04-25 NOTE — ED Triage Notes (Signed)
Per [facility] pt coming from home- family found patient sitting on couch this morning with altered mental status. Patient alert to self but nothing else. EMS states patient agitated en route trying to pull IV out and get up. Patient also had a mechanical fall last night landing on an ottoman. Ems states pt landed on her right side but did not hit head. Patient hot to touch.    350 CC NS given en route.

## 2022-04-25 NOTE — ED Provider Notes (Signed)
Minor Provider Note   CSN: [id number] Arrival date & time: 04/25/22  1132     History  Chief Complaint  Patient presents with   Altered Mental Status    Ebony Scott is a 72 y.o. female.  72 year old female with prior medical history as detailed below presents for evaluation.  Patient transported by EMS from home.  Patient reports that she has not been feeling well for the last 2 or 3 days.  This morning patient noted fever.  Patient was febrile on her arrival.  Patient complains of mild cough.  Patient also complains of generalized weakness.  Patient with complaint of loose stool at home as well.  The history is provided by the patient and medical records.       Home Medications Prior to Admission medications   Medication Sig Start Date End Date Taking? Authorizing Provider  acetaminophen (TYLENOL) 650 MG CR tablet Take 1,300 mg by mouth daily as needed for pain.   Yes [provider]  acetic acid-hydrocortisone (VOSOL-HC) OTIC solution Place 4 drops into both ears See admin instructions. Instill 4 drops into both ears the first 5 days of the month, then as directed/as needed for itching   Yes [provider]  albuterol (VENTOLIN HFA) 108 (90 Base) MCG/ACT inhaler Inhale 2 puffs into the lungs every 6 (six) hours as needed for wheezing or shortness of breath. 03/01/21  Yes Kozlow, Donnamarie Poag, MD  allopurinol (ZYLOPRIM) 100 MG tablet TAKE 1 TABLET BY MOUTH DAILY Patient taking differently: Take 100 mg by mouth daily with supper. 02/15/22  Yes [name], MD  Alpha-D-Galactosidase (BEANO PO) Take 2 tablets by mouth 3 (three) times daily before meals.   Yes [provider]  Alpha-Lipoic Acid 600 MG TABS Take 600 mg by mouth daily.   Yes [provider]  ammonium lactate (AMLACTIN DAILY) 12 % lotion Apply 1 Application topically as needed for dry skin. 03/07/22  Yes Felipa Furnace, DPM   atorvastatin (LIPITOR) 80 MG tablet TAKE 1 TABLET BY MOUTH DAILY Patient taking differently: Take 40 mg by mouth daily with supper. 02/15/22  Yes [name], MD  benzonatate (TESSALON) 200 MG capsule TAKE 1 CAPSULE BY MOUTH THREE TIMES DAILY AS NEEDED FOR COUGH Patient taking differently: Take 200 mg by mouth 2 (two) times daily as needed for cough. 07/13/21  Yes [name], MD  Calcium-Magnesium-Vitamin D (CALCIUM MAGNESIUM PO) Take 1 tablet by mouth at bedtime.   Yes [provider]  cetirizine (ZYRTEC) 10 MG tablet Take 1 tablet (10 mg total) by mouth 2 (two) times daily as needed for allergies (Can take a n extra dose during flare ups.). Patient taking differently: Take 10 mg by mouth at bedtime. 06/13/21  Yes Kozlow, Donnamarie Poag, MD  Cholecalciferol (VITAMIN D3) 25 MCG (1000 UT) capsule Take 1,000 Units by mouth at bedtime.   Yes [provider]  clopidogrel (PLAVIX) 75 MG tablet Take 1 tablet (75 mg total) by mouth daily. 06/02/21  Yes Loel Dubonnet, NP  cyproheptadine (PERIACTIN) 4 MG tablet Take 2 tablets (8 mg total) by mouth at bedtime. Take 2 tablets at bedtime as needed to treat facial pain or sleep dysfunction. Patient taking differently: Take 8 mg by mouth at bedtime. 06/13/21  Yes Kozlow, Donnamarie Poag, MD  denosumab (PROLIA) 60 MG/ML SOSY injection Inject 60 mg into the skin every 6 (six) months.   Yes [provider]  dexlansoprazole (DEXILANT) 60 MG capsule Take 1 capsule (60 mg total) by mouth daily. 06/13/21  Yes Kozlow, Donnamarie Poag, MD  diclofenac Sodium (VOLTAREN) 1 % GEL Apply 1 Application topically 4 (four) times daily as needed (pain). 11/13/21  Yes [name], MD  empagliflozin (JARDIANCE) 10 MG TABS tablet Take 1 tablet (10 mg total) by mouth daily before breakfast. 04/11/22  Yes [name], MD  EPINEPHrine 0.3 mg/0.3 mL IJ SOAJ injection USE AS DIRECTED BY YOUR PHYSICIAN INTRAMUSCULARLY AS NEEDED FOR ANAPHYLAXIS Patient taking  differently: Inject 0.3 mg into the muscle as needed for anaphylaxis. 01/26/22  Yes Kozlow, Donnamarie Poag, MD  famotidine (PEPCID) 20 MG tablet TAKE 1 TABLET BY MOUTH EACH NIGHT AT BEDTIME Patient taking differently: Take 20 mg by mouth at bedtime. 04/04/22  Yes Kozlow, Donnamarie Poag, MD  Fluocinolone Acetonide (DERMOTIC) 0.01 % OIL Place 5 drops into both ears See admin instructions. INSTILL 5 DROPS INTO THE AFFECTED EAR(S) TWICE DAILY FOR 14 DAYS, THEN STOP AND USE AS NEEDED FOR ITCHING[   Yes [provider]  fluticasone (FLONASE) 50 MCG/ACT nasal spray USE 2 SPRAYS IN EACH NOSTRIL DAILY Patient taking differently: Place 2 sprays into both nostrils daily. 09/02/20  Yes Freddi Starr, MD  folic acid (FOLVITE) 1 MG tablet Take 1 tablet (1 mg total) by mouth daily. 02/16/22  Yes [name], MD  furosemide (LASIX) 40 MG tablet Take 1 tablet (40 mg total) by mouth daily. Patient taking differently: Take 40 mg by mouth in the morning. 11/13/21  Yes Buford Dresser, MD  gabapentin (NEURONTIN) 300 MG capsule TAKE 1 CAPSULE BY MOUTH 3 TIMES DAILY AS NEEDED Patient taking differently: Take 300 mg by mouth at bedtime. 03/07/22  Yes Rice, Resa Miner, MD  guaiFENesin (MUCINEX) 600 MG 12 hr tablet Take 1 tablet (600 mg total) by mouth 2 (two) times daily as needed for cough or to loosen phlegm. Patient taking differently: Take 600 mg by mouth 2 (two) times daily. 05/09/20  Yes [name], MD  HIZENTRA 1 GM/5ML SOLN 13 g by Subcutaneous Infusion route See admin instructions. Hizentra- 13 g infused every Friday  ("3 times/5 ml = 65 ml total once weekly") 08/09/21  Yes [provider]  hydroxychloroquine (PLAQUENIL) 200 MG tablet Take 1 tablet (200 mg total) by mouth daily. 03/07/22  Yes Rice, Resa Miner, MD  ipratropium-albuterol (DUONEB) 0.5-2.5 (3) MG/3ML SOLN Take 3 mLs by nebulization every 6 (six) hours as needed. Patient taking differently: Take 3 mLs by nebulization every 6  (six) hours as needed (for shortness of breath or wheezing). 11/14/21  Yes Freddi Starr, MD  isosorbide-hydrALAZINE (BIDIL) 20-37.5 MG tablet Take 1 tablet by mouth 2 (two) times daily. Patient taking differently: Take 1 tablet by mouth daily with supper. 04/11/22  Yes [name], MD  Lactase (LACTOSE FAST ACTING RELIEF PO) Take 3 tablets by mouth 3 (three) times daily before meals.   Yes [provider]  lidocaine-prilocaine (EMLA) cream Apply 1 application  topically as needed (pain). 04/18/21  Yes [provider]  magnesium oxide (MAG-OX) 400 (240 Mg) MG tablet Take 400 mg by mouth at bedtime.   Yes [provider]  Menthol, Topical Analgesic, (BIOFREEZE) 4 % GEL Apply 1 application  topically daily as needed (for shoulder or joint pain).   Yes [provider]  mirtazapine (REMERON SOL-TAB) 30 MG disintegrating tablet Take 1 tablet (30 mg total) by mouth at bedtime as needed (sleep).  Patient taking differently: Take 30 mg by mouth at bedtime. 11/13/21  Yes [name], MD  montelukast (SINGULAIR) 10 MG tablet Take 1 tablet (10 mg total) by mouth daily. Patient taking differently: Take 10 mg by mouth daily after supper. 04/11/22  Yes [name], MD  Nystatin (GERHARDT'S BUTT CREAM) CREA Apply topically 3 times a day as needed for itching or irritation 12/30/20  Yes [name], MD  nystatin (MYCOSTATIN) 100000 UNIT/ML suspension Use as directed 15 mLs in the mouth or throat at bedtime.   Yes [provider]  potassium chloride (KLOR-CON M) 10 MEQ tablet Take 1 tablet (10 mEq total) by mouth daily. Patient taking differently: Take 10 mEq by mouth daily with supper. 12/21/21  Yes Orson Slick, MD  predniSONE (DELTASONE) 1 MG tablet Take 2 tablets (2 mg total) by mouth daily. Total of 7 mg daily. Patient taking differently: Take 2 mg by mouth daily with breakfast. Total of 7 mg daily. 02/14/22  Yes Rice, Resa Miner, MD   predniSONE (DELTASONE) 5 MG tablet Take 1 tablet (5 mg total) by mouth daily with breakfast. Along with 2 mg tablets for total daily dose 7 mg total daily 02/14/22  Yes Rice, Resa Miner, MD  Prenatal Vit-Fe Fumarate-FA (PRENATAL VITAMINS PO) Take 1 tablet by mouth daily.   Yes [provider]  Probiotic Product (ALIGN PO) Take 1 capsule by mouth daily.   Yes [provider]  Propylene Glycol (SYSTANE COMPLETE) 0.6 % SOLN Place 1 drop into both eyes 2 (two) times daily. 03/01/21  Yes Kozlow, Donnamarie Poag, MD  Tiotropium Bromide-Olodaterol (STIOLTO RESPIMAT) 2.5-2.5 MCG/ACT AERS Inhale 2 puffs into the lungs daily. Patient taking differently: Inhale 2 puffs into the lungs at bedtime. 08/15/21  Yes Freddi Starr, MD  traMADol (ULTRAM) 50 MG tablet Take 1 tablet (50 mg total) by mouth at bedtime. 04/12/22  Yes [name], MD  valsartan (DIOVAN) 40 MG tablet Take 1 tablet (40 mg total) by mouth daily. Patient taking differently: Take 40 mg by mouth daily with supper. 12/21/21  Yes Orson Slick, MD  XARELTO 20 MG TABS tablet TAKE 1 TABLET BY MOUTH DAILY WITH SUPPER Patient taking differently: Take 20 mg by mouth daily with supper. 02/15/22  Yes [name], MD  azelastine (ASTELIN) 0.1 % nasal spray Place 2 sprays into both nostrils daily. Use in each nostril as directed 06/13/21   Kozlow, Donnamarie Poag, MD  budesonide (PULMICORT) 1 MG/2ML nebulizer solution Take 2 mLs (1 mg total) by nebulization daily. Patient not taking: Reported on 04/25/2022 11/24/21   Mar Daring, PA-C  metroNIDAZOLE (FLAGYL) 250 MG tablet Take 1 tablet (250 mg total) by mouth 3 (three) times daily. Patient not taking: Reported on 04/25/2022 04/06/22   Gatha Mayer, MD      Allergies    Baclofen, Dust mite extract, Molds & smuts, Morphine and related, Other, Penicillins, Rofecoxib, Shellfish allergy, Shrimp extract allergy skin test, Tetracycline hcl, Tetracycline hcl, Xolair [omalizumab],  Zoledronic acid, Dilaudid [hydromorphone hcl], Hydrocodone-acetaminophen, Levofloxacin, Oxycodone hcl, Oxycodone hcl, Paroxetine, Celecoxib, Diltiazem hcl, Lactose, Lactose intolerance (gi), Oxycodone-acetaminophen, Rituximab, Tree extract, Gamunex [immune globulin], and Penicillin g procaine    Review of Systems   Review of Systems  All other systems reviewed and are negative.   Physical Exam Updated Vital Signs BP (!) 140/65   Pulse 97   Temp (!) 103 F (39.4 C) (Oral)   Resp 18  Ht '5\' 1"'$  (1.549 m)   Wt 62.1 kg   SpO2 98%   BMI 25.89 kg/m  Physical Exam Vitals and nursing note reviewed.  Constitutional:      General: She is not in acute distress.    Appearance: She is well-developed.     Comments: Alert, appears ill, febrile  HENT:     Head: Normocephalic and atraumatic.     Mouth/Throat:     Mouth: Mucous membranes are dry.  Eyes:     Conjunctiva/sclera: Conjunctivae normal.     Pupils: Pupils are equal, round, and reactive to light.  Cardiovascular:     Rate and Rhythm: Regular rhythm. Tachycardia present.     Heart sounds: Normal heart sounds.  Pulmonary:     Effort: Pulmonary effort is normal. No respiratory distress.     Breath sounds: Normal breath sounds.  Abdominal:     General: There is no distension.     Palpations: Abdomen is soft.     Tenderness: There is no abdominal tenderness.  Musculoskeletal:        General: No deformity. Normal range of motion.     Cervical back: Normal range of motion and neck supple.  Skin:    General: Skin is warm and dry.  Neurological:     General: No focal deficit present.     Mental Status: She is alert.     Comments: Alert, oriented to person place     ED Results / Procedures / Treatments   Labs (all labs ordered are listed, but only abnormal results are displayed) Labs Reviewed  CBC WITH DIFFERENTIAL/PLATELET - Abnormal; Notable for the following components:      Result Value   WBC 17.8 (*)    RBC 3.65 (*)     MCV 108.5 (*)    MCH 35.1 (*)    Neutro Abs 13.0 (*)    Monocytes Absolute 2.4 (*)    Abs Immature Granulocytes 0.12 (*)    All other components within normal limits  COMPREHENSIVE METABOLIC PANEL - Abnormal; Notable for the following components:   BUN 29 (*)    Creatinine, Ser 1.81 (*)    GFR, Estimated 29 (*)    All other components within normal limits  BRAIN NATRIURETIC PEPTIDE - Abnormal; Notable for the following components:   B Natriuretic Peptide 1,049.7 (*)    All other components within normal limits  URINALYSIS, W/ REFLEX TO CULTURE (INFECTION SUSPECTED) - Abnormal; Notable for the following components:   Glucose, UA 150 (*)    Protein, ur 30 (*)    All other components within normal limits  CBG MONITORING, ED - Abnormal; Notable for the following components:   Glucose-Capillary 102 (*)    All other components within normal limits  TROPONIN I (HIGH SENSITIVITY) - Abnormal; Notable for the following components:   Troponin I (High Sensitivity) 112 (*)    All other components within normal limits  RESP PANEL BY RT-PCR (RSV, FLU A&B, COVID)  [id number]  CULTURE, BLOOD (ROUTINE X 2)  CULTURE, BLOOD (ROUTINE X 2)  LACTIC ACID, PLASMA  PROTIME-INR  LACTIC ACID, PLASMA  TYPE AND SCREEN  TROPONIN I (HIGH SENSITIVITY)    EKG EKG Interpretation  Date/Time:  Wednesday April 25 2022 11:46:01 EST Ventricular Rate:  97 PR Interval:  150 QRS Duration: 98 QT Interval:  417 QTC Calculation: 530 R Axis:   42 Text Interpretation: Sinus rhythm Probable anteroseptal infarct, recent Prolonged QT interval Confirmed by Dene Gentry [phone]) on  04/25/2022 11:55:35 AM  Radiology CT ABDOMEN PELVIS WO CONTRAST  Result Date: 04/25/2022 CLINICAL DATA:  Abdominal pain EXAM: CT ABDOMEN AND PELVIS WITHOUT CONTRAST TECHNIQUE: Multidetector CT imaging of the abdomen and pelvis was performed following the standard protocol without IV contrast. RADIATION DOSE REDUCTION: This exam was performed  according to the departmental dose-optimization program which includes automated exposure control, adjustment of the mA and/or kV according to patient size and/or use of iterative reconstruction technique. COMPARISON:  10/04/2020 FINDINGS: Lower chest: Moderate patchy infiltrates are seen in right lower lung fields. Small patchy infiltrates are seen in left lower lung field. There is no pleural effusion. Hepatobiliary: Surgical clips are seen in gallbladder fossa. There is no dilation of bile ducts. Pancreas: There is fatty infiltration. Spleen: Spleen is not enlarged. Adrenals/Urinary Tract: Adrenals are unremarkable. There is no hydronephrosis. There are no renal or ureteral stones. Urinary bladder is unremarkable. Stomach/Bowel: Small hiatal hernia is seen. Small bowel loops are not dilated. Appendix is not distinctly visualized. There is no pericecal inflammation. There is diffuse wall thickening in rectum. There is stranding in the perirectal fat planes without any loculated fluid collections. Vascular/Lymphatic: Scattered calcifications are seen in aorta and its major branches including coronary arteries. Reproductive: Uterus is not seen. Other: There is no ascites or pneumoperitoneum. There are coarse calcifications in left breast. Musculoskeletal: Degenerative changes are noted in lumbar spine, more severe at L4-L5 level with spinal stenosis and encroachment of neural foramina. IMPRESSION: There is abnormal diffuse wall thickening in rectum along with perirectal stranding. Findings suggest acute proctitis. Possibility of underlying neoplastic process is not excluded. When the patient's clinical condition permits, endoscopy should be considered. There are patchy infiltrates in both lower lung fields, more so on the right side suggesting atelectasis/pneumonia. There is no evidence of intestinal obstruction or pneumoperitoneum. There is no hydronephrosis. Arteriosclerosis.  Lumbar spondylosis. Other findings as  described in the body of the report. Electronically Signed   By: Elmer Picker M.D.   On: 04/25/2022 14:25   CT Head Wo Contrast  Result Date: 04/25/2022 CLINICAL DATA:  Altered mental status EXAM: CT HEAD WITHOUT CONTRAST TECHNIQUE: Contiguous axial images were obtained from the base of the skull through the vertex without intravenous contrast. RADIATION DOSE REDUCTION: This exam was performed according to the departmental dose-optimization program which includes automated exposure control, adjustment of the mA and/or kV according to patient size and/or use of iterative reconstruction technique. COMPARISON:  02/23/2020 FINDINGS: Brain: No acute intracranial findings are seen. There are no signs of bleeding within the cranium. Cortical sulci are prominent. There is decreased density in periventricular and subcortical white matter. Vascular: Unremarkable. Skull: No fracture is seen in calvarium. Sinuses/Orbits: There is mucosal thickening in the ethmoid sinus. Other: None. IMPRESSION: No acute intracranial findings are seen in noncontrast CT brain. Atrophy. Small-vessel disease. Electronically Signed   By: Elmer Picker M.D.   On: 04/25/2022 14:15   DG Chest Port 1 View  Result Date: 04/25/2022 CLINICAL DATA:  Fever EXAM: PORTABLE CHEST 1 VIEW COMPARISON:  X-ray 02/04/2021.  CT 04/16/2022 FINDINGS: Enlarged cardiopericardial silhouette with vascular congestion. No pneumothorax, effusion. No consolidation. Overlapping cardiac leads. Film is under penetrated. Fixation hardware along the right shoulder and cervical spine at the edge of the imaging field. IMPRESSION: Enlarged cardiopericardial silhouette with vascular congestion. Electronically Signed   By: Jill Side M.D.   On: 04/25/2022 13:10    Procedures Procedures    Medications Ordered in ED Medications  vancomycin (VANCOREADY) IVPB  1500 mg/300 mL (1,500 mg Intravenous New Bag/Given 04/25/22 1310)  acetaminophen (TYLENOL) suppository 650  mg (650 mg Rectal Given 04/25/22 1153)  sodium chloride 0.9 % bolus 500 mL (0 mLs Intravenous Stopped 04/25/22 1327)  ceFEPIme (MAXIPIME) 2 g in sodium chloride 0.9 % 100 mL IVPB (0 g Intravenous Stopped 04/25/22 1239)  metroNIDAZOLE (FLAGYL) IVPB 500 mg (500 mg Intravenous New Bag/Given 04/25/22 1244)    ED Course/ Medical Decision Making/ A&P                             Medical Decision Making Amount and/or Complexity of Data Reviewed Labs: ordered. Radiology: ordered.  Risk OTC drugs. Prescription drug management. Decision regarding hospitalization.    Medical Screen Complete  This patient presented to the ED with complaint of fever.  This complaint involves an extensive number of treatment options. The initial differential diagnosis includes, but is not limited to, bacterial versus viral infection, metabolic abnormality, etc.  This presentation is: Acute, Self-Limited, Previously Undiagnosed, Uncertain Prognosis, Complicated, Systemic Symptoms, and Threat to Life/Bodily Function  Patient with chronic immunosuppression presents with fever to 103.  Onset of symptoms over the last 2 to 3 days.  Code sepsis initiated after patient's arrival.  Broad-spectrum antibiotics and IV fluids administered.  Patient's workup is suggestive of possible proctitis and/or concurrent pneumonia.  Patient is clinically dry.  Creatinine appears to be mildly above baseline.  Patient with history of cardiomyopathy.  EF approximately 30% from echo performed 11 months previously.  Gentle IV fluids administered.  Patient will require admission.  Hospitalist service made aware of case and evaluate for same.  Additional history obtained:  Additional history obtained from [facility] External records from outside sources obtained and reviewed including prior ED visits and prior Inpatient records.    Lab Tests:  I ordered and personally interpreted labs.  The pertinent results include: CBC, CMP, troponin,  BNP, lactic acid, UA   Imaging Studies ordered:  I ordered imaging studies including CT head, CT abdomen pelvis, chest x-ray I independently visualized and interpreted obtained imaging which showed suspected prostatitis, suspected pneumonia I agree with the radiologist interpretation.   Cardiac Monitoring:  The patient was maintained on a cardiac monitor.  I personally viewed and interpreted the cardiac monitor which showed an underlying rhythm of: NSR   Medicines ordered:  I ordered medication including spectrum antibiotics, IV fluids for suspected sepsis Reevaluation of the patient after these medicines showed that the patient: improved   Problem List / ED Course:  Fever, leukocytosis, suspected sepsis   Reevaluation:  After the interventions noted above, I reevaluated the patient and found that they have: improved  Disposition:  After consideration of the diagnostic results and the patients response to treatment, I feel that the patent would benefit from admission.   CRITICAL CARE Performed by: Valarie Merino   Total critical care time: 30 minutes  Critical care time was exclusive of separately billable procedures and treating other patients.  Critical care was necessary to treat or prevent imminent or life-threatening deterioration.  Critical care was time spent personally by me on the following activities: development of treatment plan with patient and/or surrogate as well as nursing, discussions with consultants, evaluation of patient's response to treatment, examination of patient, obtaining history from patient or surrogate, ordering and performing treatments and interventions, ordering and review of laboratory studies, ordering and review of radiographic studies, pulse oximetry and re-evaluation of patient's condition.  Final Clinical Impression(s) / ED Diagnoses Final diagnoses:  Fever, unspecified fever cause    Rx / DC Orders ED  Discharge Orders     None         Valarie Merino, MD 04/25/22 1510

## 2022-04-26 DIAGNOSIS — I130 Hypertensive heart and chronic kidney disease with heart failure and stage 1 through stage 4 chronic kidney disease, or unspecified chronic kidney disease: Secondary | ICD-10-CM | POA: Diagnosis present

## 2022-04-26 DIAGNOSIS — E1122 Type 2 diabetes mellitus with diabetic chronic kidney disease: Secondary | ICD-10-CM | POA: Diagnosis present

## 2022-04-26 DIAGNOSIS — E876 Hypokalemia: Secondary | ICD-10-CM | POA: Diagnosis present

## 2022-04-26 DIAGNOSIS — M7989 Other specified soft tissue disorders: Secondary | ICD-10-CM | POA: Diagnosis not present

## 2022-04-26 DIAGNOSIS — J4540 Moderate persistent asthma, uncomplicated: Secondary | ICD-10-CM | POA: Diagnosis present

## 2022-04-26 DIAGNOSIS — I5022 Chronic systolic (congestive) heart failure: Secondary | ICD-10-CM | POA: Diagnosis not present

## 2022-04-26 DIAGNOSIS — M797 Fibromyalgia: Secondary | ICD-10-CM | POA: Diagnosis present

## 2022-04-26 DIAGNOSIS — E7800 Pure hypercholesterolemia, unspecified: Secondary | ICD-10-CM | POA: Diagnosis present

## 2022-04-26 DIAGNOSIS — E8721 Acute metabolic acidosis: Secondary | ICD-10-CM | POA: Diagnosis present

## 2022-04-26 DIAGNOSIS — J9601 Acute respiratory failure with hypoxia: Secondary | ICD-10-CM | POA: Diagnosis present

## 2022-04-26 DIAGNOSIS — Z1152 Encounter for screening for COVID-19: Secondary | ICD-10-CM | POA: Diagnosis not present

## 2022-04-26 DIAGNOSIS — R935 Abnormal findings on diagnostic imaging of other abdominal regions, including retroperitoneum: Secondary | ICD-10-CM | POA: Diagnosis not present

## 2022-04-26 DIAGNOSIS — N1832 Chronic kidney disease, stage 3b: Secondary | ICD-10-CM | POA: Diagnosis present

## 2022-04-26 DIAGNOSIS — Z79899 Other long term (current) drug therapy: Secondary | ICD-10-CM | POA: Diagnosis not present

## 2022-04-26 DIAGNOSIS — Z955 Presence of coronary angioplasty implant and graft: Secondary | ICD-10-CM | POA: Diagnosis not present

## 2022-04-26 DIAGNOSIS — A419 Sepsis, unspecified organism: Secondary | ICD-10-CM | POA: Diagnosis present

## 2022-04-26 DIAGNOSIS — N179 Acute kidney failure, unspecified: Secondary | ICD-10-CM | POA: Diagnosis present

## 2022-04-26 DIAGNOSIS — G9341 Metabolic encephalopathy: Secondary | ICD-10-CM | POA: Diagnosis present

## 2022-04-26 DIAGNOSIS — E039 Hypothyroidism, unspecified: Secondary | ICD-10-CM | POA: Diagnosis present

## 2022-04-26 DIAGNOSIS — R7989 Other specified abnormal findings of blood chemistry: Secondary | ICD-10-CM | POA: Diagnosis not present

## 2022-04-26 DIAGNOSIS — K6289 Other specified diseases of anus and rectum: Secondary | ICD-10-CM | POA: Diagnosis not present

## 2022-04-26 DIAGNOSIS — J470 Bronchiectasis with acute lower respiratory infection: Secondary | ICD-10-CM | POA: Diagnosis present

## 2022-04-26 DIAGNOSIS — I2489 Other forms of acute ischemic heart disease: Secondary | ICD-10-CM | POA: Diagnosis present

## 2022-04-26 DIAGNOSIS — M0600 Rheumatoid arthritis without rheumatoid factor, unspecified site: Secondary | ICD-10-CM | POA: Diagnosis not present

## 2022-04-26 DIAGNOSIS — R6520 Severe sepsis without septic shock: Secondary | ICD-10-CM | POA: Diagnosis present

## 2022-04-26 DIAGNOSIS — I5043 Acute on chronic combined systolic (congestive) and diastolic (congestive) heart failure: Secondary | ICD-10-CM | POA: Diagnosis present

## 2022-04-26 DIAGNOSIS — D801 Nonfamilial hypogammaglobulinemia: Secondary | ICD-10-CM | POA: Diagnosis present

## 2022-04-26 DIAGNOSIS — J189 Pneumonia, unspecified organism: Secondary | ICD-10-CM | POA: Diagnosis present

## 2022-04-26 DIAGNOSIS — M069 Rheumatoid arthritis, unspecified: Secondary | ICD-10-CM | POA: Diagnosis present

## 2022-04-26 DIAGNOSIS — E1151 Type 2 diabetes mellitus with diabetic peripheral angiopathy without gangrene: Secondary | ICD-10-CM | POA: Diagnosis present

## 2022-04-26 LAB — CBC
HCT: 36.8 % (ref 36.0–46.0)
Hemoglobin: 11.3 g/dL — ABNORMAL LOW (ref 12.0–15.0)
MCH: 35.3 pg — ABNORMAL HIGH (ref 26.0–34.0)
MCHC: 30.7 g/dL (ref 30.0–36.0)
MCV: 115 fL — ABNORMAL HIGH (ref 80.0–100.0)
Platelets: 135 10*3/uL — ABNORMAL LOW (ref 150–400)
RBC: 3.2 MIL/uL — ABNORMAL LOW (ref 3.87–5.11)
RDW: 15.4 % (ref 11.5–15.5)
WBC: 14.4 10*3/uL — ABNORMAL HIGH (ref 4.0–10.5)
nRBC: 0 % (ref 0.0–0.2)

## 2022-04-26 LAB — COMPREHENSIVE METABOLIC PANEL
ALT: 21 U/L (ref 0–44)
AST: 35 U/L (ref 15–41)
Albumin: 3 g/dL — ABNORMAL LOW (ref 3.5–5.0)
Alkaline Phosphatase: 45 U/L (ref 38–126)
Anion gap: 14 (ref 5–15)
BUN: 28 mg/dL — ABNORMAL HIGH (ref 8–23)
CO2: 15 mmol/L — ABNORMAL LOW (ref 22–32)
Calcium: 8.5 mg/dL — ABNORMAL LOW (ref 8.9–10.3)
Chloride: 112 mmol/L — ABNORMAL HIGH (ref 98–111)
Creatinine, Ser: 1.71 mg/dL — ABNORMAL HIGH (ref 0.44–1.00)
GFR, Estimated: 31 mL/min — ABNORMAL LOW (ref >60)
Glucose, Bld: 59 mg/dL — ABNORMAL LOW (ref 70–99)
Potassium: 4.1 mmol/L (ref 3.5–5.1)
Sodium: 141 mmol/L (ref 135–145)
Total Bilirubin: 1.7 mg/dL — ABNORMAL HIGH (ref 0.3–1.2)
Total Protein: 6.4 g/dL — ABNORMAL LOW (ref 6.5–8.1)

## 2022-04-26 LAB — STREP PNEUMONIAE URINARY ANTIGEN: Strep Pneumo Urinary Antigen: NEGATIVE

## 2022-04-26 LAB — MAGNESIUM: Magnesium: 2 mg/dL (ref 1.7–2.4)

## 2022-04-26 MED ORDER — CIPROFLOXACIN HCL 0.3 % OP SOLN
1.0000 [drp] | OPHTHALMIC | Status: DC
  Administered 2022-04-26 – 2022-05-04 (×37): 1 [drp] via OPHTHALMIC
  Filled 2022-04-26 (×2): qty 2.5

## 2022-04-26 MED ORDER — ATORVASTATIN CALCIUM 40 MG PO TABS
80.0000 mg | ORAL_TABLET | Freq: Every day | ORAL | Status: DC
  Administered 2022-04-27 – 2022-05-04 (×8): 80 mg via ORAL
  Filled 2022-04-26 (×9): qty 2

## 2022-04-26 MED ORDER — ACETAMINOPHEN 650 MG RE SUPP
650.0000 mg | RECTAL | Status: DC | PRN
  Administered 2022-04-26: 650 mg via RECTAL
  Filled 2022-04-26 (×4): qty 1

## 2022-04-26 NOTE — Plan of Care (Signed)
  Problem: Activity: Goal: Ability to tolerate increased activity will improve Outcome: Progressing   Problem: Activity: Goal: Risk for activity intolerance will decrease Outcome: Progressing   Problem: Nutrition: Goal: Adequate nutrition will be maintained Outcome: Progressing   Problem: Pain Managment: Goal: General experience of comfort will improve Outcome: Progressing   Problem: Safety: Goal: Ability to remain free from injury will improve Outcome: Progressing   Problem: Skin Integrity: Goal: Risk for impaired skin integrity will decrease Outcome: Progressing

## 2022-04-26 NOTE — Consult Note (Addendum)
Cardiology Consultation   Patient ID: EVALUNA WILLARD MRN: [id number]; DOB: 12-04-1950  Admit date: 04/25/2022 Date of Consult: 04/26/2022  PCP:  [name], MD   [facility] Cardiologist:  Buford Dresser, MD   {  Patient Profile:   COBY BARTOLI is a 72 y.o. 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 Starla Link.  History of Present Illness:   Ms. Higbie is a 72 year old female with the above history who is followed by Dr. Harrell Gave.  PCP ordered an Echo in 08/2020 which showed LVEF of 35-40% with septal apical and anterior wall hypokinesis which are new. EF was previously >75% in 2021. She was advised to follow-up with Cardiology but before this could happen she was admitted in 09/2020 with septic olecranon bursitis of the right elbow. She was briefly diuresed during that admission but this was stopped due to worsening renal function. She was started on GDMT. Plan was for outpatient ischemic after she recovered from acute infection. She ultimately had a right/left cardiac catheterization in 12/2020 which showed severe single vessel CAD with 95% stenosis of 1st Diag and 90% stenosis of mid LAD but otherwise minimal disease. She underwent successful PCI with DES covering both the 1st Diag and mid LAD lesion. Also showed moderate mostly secondary pulmonary hypertension/ pulmonary venous hypertension and LVEDP of 25 mmHg. She was given IV Lasix on the cath table and prescribed a 3 day course of PO Lasix.  She was then admitted in 01/2021 for symptomatic anemia. 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 Aspirin was stopped but she was  continued on Plavix. Most recent Echo in 05/2021 showed LVEF of 30-35% with septal and apical akinesis and  hypokinesis of the anterior and distal inferior wall as well as grade 1 diastolic dysfunction and mild MR. She was last seen by Dr. Harrell Gave in 10/2019 at which time she reported some bilateral lower extremity edema as the day progresses but was otherwise doing well form a cardiac standpoint.   Patient presented to the [facility] ED on 04/25/2022 for further evaluation of altered mental status.  Upon arrival to the ED, she was febrile with temp of 103 and tachycardic and tachypneic.  EKG showed normal sinus rhythm, rate 97 bpm, with mild ST elevation primarily in lead V2 but no acute changes compared to prior tracing.  High-sensitivity troponin mildly elevated and flat at 112 >> 130.  BNP elevated at 1349.  Chest x-ray showed enlarged cardiopericardial silhouette with vascular congestion.  CT showed no acute findings.  Pelvic CT showed abdomen normal and diffuse wall thickening and rectum along with perirectal stranding suggestive of acute proctitis; however, an underlying neoplastic process cannot be excluded.  Also showed patchy infiltrates in bilateral lower lung fields (more so on the right) suggestive of atelectasis/pneumonia. WBC 17.8, Hgb 12.8, Plts 182. Na 141, K 3.5, Glucose 97, BUN 29, Cr 1.81. LFTs normal. Lactic acid normal. Respiratory panel negative. Urinarlysis not suggestive of UTI. Blood cultures negative so far.  She was admitted for sepsis secondary to possible pneumonia or acute proctitis.  She was started on antibiotics and IV fluids. Cardiology was consulted  given history of CHF.  At the time of this evaluation, patient is very lethargic. She opened her eyes for me briefly but then shut them immediately.  She answered a couple questions but then would just make sounds when I asked questions. She was able to tell me her name and date of birth and that she was in the hospital but she  could not tell me why she came to the hospital.  I asked her if she was in any pain and she said yes and then said her feet were hurting but stated this is been going on for a long time.  She denies any chest pain.  She did report some shortness of breath at home she was unable to elaborate on this.  That is really all the history that I was able to gather.  There is no family at bedside to assist with history.  H&P, family reportedly found her sitting on the couch yesterday morning with altered mental status.  She also reportedly had a mechanical fall the night before and then on the ottoman.   Past Medical History:  Diagnosis Date   Abscess of dorsum of right hand 09/22/2020   Acute deep vein thrombosis (DVT) of right lower extremity ([place]) 12/13/2017   Allergic rhinitis    Anemia    Angio-edema    Anxiety    pt denies dx   Arthritis    Phreesia 10/21/2019   Asthma    Phreesia 10/21/2019   Breast cancer ([place]) 1998   Left breast, in remission   Bronchiectasis ([place])    Cataract    REMOVED   CHF (congestive heart failure) ([place])    Clostridium difficile colitis [id number]   Complication of anesthesia    "had hard time waking up from it several times" (02/20/2012), no anesthesia problems since 2014   Coronary artery disease    Depression    "some; don't take anything for it" (02/20/2012), pt denies dx as of 06/02/21   Diverticulosis    DVT (deep venous thrombosis) ([place])    Dyspnea    all the time   Fibromyalgia 11/2011   GAVE (gastric antral vascular ectasia)    GERD (gastroesophageal reflux disease)    Graves disease    Headache(784.0)    "related to allergies; more at different times during the year" (02/20/2012)   Hemorrhoids    Hiatal hernia    back and neck   History of blood transfusion 2023   1 unit, 5 units of Iron   Hx of adenomatous colonic polyps 04/12/2016   Hypercholesteremia    good cholesterol is high   Hypothyroidism    IBS (irritable bowel syndrome)    Moderate  persistent asthma    -FeV1 72% 2011, -IgE 102 2011, CT sinus Neg 2011   Osteomyelitis of second toe of right foot ([place]) 09/23/2020   Osteoporosis    on reclast yearly   Peripheral vascular disease ([place]) 2019   DVTs (3) lungs and leg   Pneumonia 04/2011; ~ 11/2011   "double; single" (02/20/2012)   Septic olecranon bursitis of right elbow 09/22/2020   Seronegative rheumatoid arthritis ([place])    Dr. Lahoma Rocker   SIRS (systemic inflammatory response syndrome) ([place]) 02/10/2018   Sleep apnea    uses cpap nightly   Tracheobronchomalacia     Past Surgical History:  Procedure Laterality Date   ABDOMINAL HYSTERECTOMY N/A    Phreesia 10/21/2019   ANTERIOR AND POSTERIOR REPAIR  1990's   APPENDECTOMY  ARGON LASER APPLICATION  [id number]   Procedure: ARGON LASER APPLICATION;  Surgeon: Yetta Flock, MD;  [facility]: [facility];  Service: Gastroenterology;;   BREAST LUMPECTOMY  1998   left   BREAST SURGERY N/A    Phreesia 10/21/2019   BRONCHIAL WASHINGS  04/05/2020   Procedure: BRONCHIAL WASHINGS;  Surgeon: Freddi Starr, MD;  [facility];  Service: Pulmonary;;   CAPSULOTOMY Right 08/28/2021   Procedure: CAPSULOTOMY;  Surgeon: Felipa Furnace, DPM;  Location: [facility];  Service: Podiatry;  Laterality: Right;   CARPOMETACARPEL ([place]) FUSION OF THUMB WITH AUTOGRAFT FROM RADIUS  ~ 2009   "both thumbs" (02/20/2012)   CATARACT EXTRACTION W/ INTRAOCULAR LENS  IMPLANT, BILATERAL  2012   CERVICAL DISCECTOMY  10/2001   C5-C6   CERVICAL FUSION  2003   C3-C4   CHOLECYSTECTOMY     COLONOSCOPY     CORONARY STENT INTERVENTION N/A 01/11/2021   Procedure: CORONARY STENT INTERVENTION;  Surgeon: Leonie Man, MD;  Location: [facility];  Service: Cardiovascular;  Laterality: N/A;   DEBRIDEMENT TENNIS ELBOW  ?1970's   right   ESOPHAGOGASTRODUODENOSCOPY     ESOPHAGOGASTRODUODENOSCOPY (EGD) WITH PROPOFOL N/A 02/05/2021   Procedure: ESOPHAGOGASTRODUODENOSCOPY (EGD) WITH  PROPOFOL;  Surgeon: Yetta Flock, MD;  [facility];  Service: Gastroenterology;  Laterality: N/A;   EYE SURGERY N/A    Phreesia 10/21/2019   HAMMER TOE SURGERY Right 08/28/2021   Procedure: HAMMER TOE REPAIR 2ND TOE RIGHT FOOT;  Surgeon: Felipa Furnace, DPM;  Location: [facility];  Service: Podiatry;  Laterality: Right;   HYSTERECTOMY     I & D EXTREMITY Right 09/22/2020   Procedure: IRRIGATION AND DEBRIDEMENT RIGHT HAND AND ELBOW;  Surgeon: Marchia Bond, MD;  [facility];  Service: Orthopedics;  Laterality: Right;   KNEE ARTHROPLASTY  ?1990's   "?right; w/cartilage repair" (02/20/2012)   MASS EXCISION Left 08/28/2021   Procedure: EXCISION OF SOFT TISSUE  MASS LEFT FOOT;  Surgeon: Felipa Furnace, DPM;  Location: [facility];  Service: Podiatry;  Laterality: Left;   NASAL SEPTUM SURGERY  1980's   POSTERIOR CERVICAL FUSION/FORAMINOTOMY  2004   "failed initial fusion; rewired  anterior neck" (02/20/2012)   REVERSE SHOULDER ARTHROPLASTY Right 02/16/2020   Procedure: REVERSE SHOULDER ARTHROPLASTY;  Surgeon: Marchia Bond, MD;  [facility];  Service: Orthopedics;  Laterality: Right;   RIGHT/LEFT HEART CATH AND CORONARY ANGIOGRAPHY N/A 01/11/2021   Procedure: RIGHT/LEFT HEART CATH AND CORONARY ANGIOGRAPHY;  Surgeon: Leonie Man, MD;  Location: [facility];  Service: Cardiovascular;  Laterality: N/A;   SPINE SURGERY N/A    Phreesia 10/21/2019   TONSILLECTOMY  ~ 1953   VESICOVAGINAL FISTULA CLOSURE W/ TAH  1988   VIDEO BRONCHOSCOPY Bilateral 08/23/2016   Procedure: VIDEO BRONCHOSCOPY WITH FLUORO;  Surgeon: Javier Glazier, MD;  [facility];  Service: Cardiopulmonary;  Laterality: Bilateral;   VIDEO BRONCHOSCOPY N/A 04/05/2020   Procedure: VIDEO BRONCHOSCOPY WITHOUT FLUORO;  Surgeon: Freddi Starr, MD;  [facility];  Service: Pulmonary;  Laterality: N/A;     Home Medications:  Prior to Admission medications   Medication Sig Start Date End Date  Taking? Authorizing Provider  acetaminophen (TYLENOL) 650 MG CR tablet Take 1,300 mg by mouth daily as needed for pain.   Yes [provider]  acetic acid-hydrocortisone (VOSOL-HC) OTIC solution Place 4 drops into both ears See admin instructions. Instill 4 drops into both ears the first 5 days of the  month, then as directed/as needed for itching   Yes [provider]  albuterol (VENTOLIN HFA) 108 (90 Base) MCG/ACT inhaler Inhale 2 puffs into the lungs every 6 (six) hours as needed for wheezing or shortness of breath. 03/01/21  Yes Kozlow, Donnamarie Poag, MD  allopurinol (ZYLOPRIM) 100 MG tablet TAKE 1 TABLET BY MOUTH DAILY Patient taking differently: Take 100 mg by mouth daily with supper. 02/15/22  Yes [name], MD  Alpha-D-Galactosidase (BEANO PO) Take 2 tablets by mouth 3 (three) times daily before meals.   Yes [provider]  Alpha-Lipoic Acid 600 MG TABS Take 600 mg by mouth daily.   Yes [provider]  ammonium lactate (AMLACTIN DAILY) 12 % lotion Apply 1 Application topically as needed for dry skin. 03/07/22  Yes Felipa Furnace, DPM  atorvastatin (LIPITOR) 80 MG tablet TAKE 1 TABLET BY MOUTH DAILY Patient taking differently: Take 80 mg by mouth daily with supper. 02/15/22  Yes [name], MD  benzonatate (TESSALON) 200 MG capsule TAKE 1 CAPSULE BY MOUTH THREE TIMES DAILY AS NEEDED FOR COUGH Patient taking differently: Take 200 mg by mouth 2 (two) times daily as needed for cough. 07/13/21  Yes [name], MD  Calcium-Magnesium-Vitamin D (CALCIUM MAGNESIUM PO) Take 1 tablet by mouth at bedtime.   Yes [provider]  cetirizine (ZYRTEC) 10 MG tablet Take 1 tablet (10 mg total) by mouth 2 (two) times daily as needed for allergies (Can take a n extra dose during flare ups.). Patient taking differently: Take 10 mg by mouth at bedtime. 06/13/21  Yes Kozlow, Donnamarie Poag, MD  Cholecalciferol (VITAMIN D3) 25 MCG (1000 UT) capsule Take 1,000 Units by  mouth at bedtime.   Yes [provider]  clopidogrel (PLAVIX) 75 MG tablet Take 1 tablet (75 mg total) by mouth daily. 06/02/21  Yes Loel Dubonnet, NP  cyproheptadine (PERIACTIN) 4 MG tablet Take 2 tablets (8 mg total) by mouth at bedtime. Take 2 tablets at bedtime as needed to treat facial pain or sleep dysfunction. Patient taking differently: Take 8 mg by mouth at bedtime. 06/13/21  Yes Kozlow, Donnamarie Poag, MD  denosumab (PROLIA) 60 MG/ML SOSY injection Inject 60 mg into the skin every 6 (six) months.   Yes [provider]  dexlansoprazole (DEXILANT) 60 MG capsule Take 1 capsule (60 mg total) by mouth daily. 06/13/21  Yes Kozlow, Donnamarie Poag, MD  diclofenac Sodium (VOLTAREN) 1 % GEL Apply 1 Application topically 4 (four) times daily as needed (pain). 11/13/21  Yes [name], MD  empagliflozin (JARDIANCE) 10 MG TABS tablet Take 1 tablet (10 mg total) by mouth daily before breakfast. 04/11/22  Yes [name], MD  EPINEPHrine 0.3 mg/0.3 mL IJ SOAJ injection USE AS DIRECTED BY YOUR PHYSICIAN INTRAMUSCULARLY AS NEEDED FOR ANAPHYLAXIS Patient taking differently: Inject 0.3 mg into the muscle as needed for anaphylaxis. 01/26/22  Yes Kozlow, Donnamarie Poag, MD  famotidine (PEPCID) 20 MG tablet TAKE 1 TABLET BY MOUTH EACH NIGHT AT BEDTIME Patient taking differently: Take 20 mg by mouth at bedtime. 04/04/22  Yes Kozlow, Donnamarie Poag, MD  Fluocinolone Acetonide (DERMOTIC) 0.01 % OIL Place 5 drops into both ears See admin instructions. INSTILL 5 DROPS INTO THE AFFECTED EAR(S) TWICE DAILY FOR 14 DAYS, THEN STOP AND USE AS NEEDED FOR ITCHING[   Yes [provider]  fluticasone (FLONASE) 50 MCG/ACT nasal spray USE 2 SPRAYS IN EACH NOSTRIL DAILY Patient taking differently: Place 2 sprays into both  nostrils daily. 09/02/20  Yes Freddi Starr, MD  folic acid (FOLVITE) 1 MG tablet Take 1 tablet (1 mg total) by mouth daily. 02/16/22  Yes [name], MD  furosemide (LASIX) 40 MG tablet Take 1  tablet (40 mg total) by mouth daily. Patient taking differently: Take 40 mg by mouth in the morning. 11/13/21  Yes Buford Dresser, MD  gabapentin (NEURONTIN) 300 MG capsule TAKE 1 CAPSULE BY MOUTH 3 TIMES DAILY AS NEEDED Patient taking differently: Take 300 mg by mouth at bedtime. 03/07/22  Yes Rice, Resa Miner, MD  guaiFENesin (MUCINEX) 600 MG 12 hr tablet Take 1 tablet (600 mg total) by mouth 2 (two) times daily as needed for cough or to loosen phlegm. Patient taking differently: Take 600 mg by mouth 2 (two) times daily. 05/09/20  Yes [name], MD  HIZENTRA 1 GM/5ML SOLN 13 g by Subcutaneous Infusion route See admin instructions. Hizentra- 13 g infused every Friday  ("3 times/5 ml = 65 ml total once weekly") 08/09/21  Yes [provider]  hydroxychloroquine (PLAQUENIL) 200 MG tablet Take 1 tablet (200 mg total) by mouth daily. 03/07/22  Yes Rice, Resa Miner, MD  ipratropium-albuterol (DUONEB) 0.5-2.5 (3) MG/3ML SOLN Take 3 mLs by nebulization every 6 (six) hours as needed. Patient taking differently: Take 3 mLs by nebulization every 6 (six) hours as needed (for shortness of breath or wheezing). 11/14/21  Yes Freddi Starr, MD  isosorbide-hydrALAZINE (BIDIL) 20-37.5 MG tablet Take 1 tablet by mouth 2 (two) times daily. Patient taking differently: Take 1 tablet by mouth daily with supper. 04/11/22  Yes [name], MD  Lactase (LACTOSE FAST ACTING RELIEF PO) Take 3 tablets by mouth 3 (three) times daily before meals.   Yes [provider]  lidocaine-prilocaine (EMLA) cream Apply 1 application  topically as needed (pain). 04/18/21  Yes [provider]  magnesium oxide (MAG-OX) 400 (240 Mg) MG tablet Take 400 mg by mouth at bedtime.   Yes [provider]  Menthol, Topical Analgesic, (BIOFREEZE) 4 % GEL Apply 1 application  topically daily as needed (for shoulder or joint pain).   Yes [provider]  mirtazapine (REMERON SOL-TAB) 30  MG disintegrating tablet Take 1 tablet (30 mg total) by mouth at bedtime as needed (sleep). Patient taking differently: Take 30 mg by mouth at bedtime. 11/13/21  Yes [name], MD  montelukast (SINGULAIR) 10 MG tablet Take 1 tablet (10 mg total) by mouth daily. Patient taking differently: Take 10 mg by mouth daily after supper. 04/11/22  Yes [name], MD  Nystatin (GERHARDT'S BUTT CREAM) CREA Apply topically 3 times a day as needed for itching or irritation 12/30/20  Yes [name], MD  nystatin (MYCOSTATIN) 100000 UNIT/ML suspension Use as directed 15 mLs in the mouth or throat at bedtime.   Yes [provider]  potassium chloride (KLOR-CON M) 10 MEQ tablet Take 1 tablet (10 mEq total) by mouth daily. Patient taking differently: Take 10 mEq by mouth daily with supper. 12/21/21  Yes Orson Slick, MD  predniSONE (DELTASONE) 1 MG tablet Take 2 tablets (2 mg total) by mouth daily. Total of 7 mg daily. Patient taking differently: Take 2 mg by mouth daily with breakfast. Total of 7 mg daily. 02/14/22  Yes Rice, Resa Miner, MD  predniSONE (DELTASONE) 5 MG tablet Take 1 tablet (5 mg total) by mouth daily with breakfast. Along with 2 mg tablets for total daily dose 7 mg  total daily 02/14/22  Yes Rice, Resa Miner, MD  Prenatal Vit-Fe Fumarate-FA (PRENATAL VITAMINS PO) Take 1 tablet by mouth daily.   Yes [provider]  Probiotic Product (ALIGN PO) Take 1 capsule by mouth daily.   Yes [provider]  Propylene Glycol (SYSTANE COMPLETE) 0.6 % SOLN Place 1 drop into both eyes 2 (two) times daily. 03/01/21  Yes Kozlow, Donnamarie Poag, MD  Tiotropium Bromide-Olodaterol (STIOLTO RESPIMAT) 2.5-2.5 MCG/ACT AERS Inhale 2 puffs into the lungs daily. Patient taking differently: Inhale 2 puffs into the lungs at bedtime. 08/15/21  Yes Freddi Starr, MD  traMADol (ULTRAM) 50 MG tablet Take 1 tablet (50 mg total) by mouth at bedtime. 04/12/22  Yes [name], MD   valsartan (DIOVAN) 40 MG tablet Take 1 tablet (40 mg total) by mouth daily. Patient taking differently: Take 40 mg by mouth daily with supper. 12/21/21  Yes Orson Slick, MD  XARELTO 20 MG TABS tablet TAKE 1 TABLET BY MOUTH DAILY WITH SUPPER Patient taking differently: Take 20 mg by mouth daily with supper. 02/15/22  Yes [name], MD  azelastine (ASTELIN) 0.1 % nasal spray Place 2 sprays into both nostrils daily. Use in each nostril as directed 06/13/21   Kozlow, Donnamarie Poag, MD  budesonide (PULMICORT) 1 MG/2ML nebulizer solution Take 2 mLs (1 mg total) by nebulization daily. Patient not taking: Reported on 04/25/2022 11/24/21   Mar Daring, PA-C  metroNIDAZOLE (FLAGYL) 250 MG tablet Take 1 tablet (250 mg total) by mouth 3 (three) times daily. Patient not taking: Reported on 04/25/2022 04/06/22   Gatha Mayer, MD    Inpatient Medications: Scheduled Meds:  allopurinol  100 mg Oral Q supper   arformoterol  15 mcg Nebulization BID   And   umeclidinium bromide  1 puff Inhalation Daily   atorvastatin  80 mg Oral Q supper   ciprofloxacin  1 drop Both Eyes Q4H while awake   clopidogrel  75 mg Oral Daily   famotidine  20 mg Oral QHS   fluticasone  2 spray Each Nare Daily   folic acid  1 mg Oral Daily   gabapentin  300 mg Oral QHS   guaiFENesin  600 mg Oral BID   montelukast  10 mg Oral QPC supper   polyvinyl alcohol  1 drop Both Eyes BID   rivaroxaban  20 mg Oral Q supper   Continuous Infusions:  sodium chloride 50 mL/hr at 04/26/22 1017   azithromycin Stopped (04/25/22 2203)   cefTRIAXone (ROCEPHIN)  IV 2 g (04/26/22 1122)   metronidazole Stopped (04/26/22 0108)   PRN Meds: benzonatate, diclofenac Sodium, ipratropium-albuterol, loratadine, ondansetron **OR** ondansetron (ZOFRAN) IV, traMADol  Allergies:    Allergies  Allergen Reactions   Baclofen Anaphylaxis and Other (See Comments)    Altered mental status requiring a 3-day hospital stay- patient became  unresponsive   "Put me in a coma for five days", Altered mental status requiring a 3-day hospital stay- patient became unresponsive   Dust Mite Extract Shortness Of Breath and Other (See Comments)    "sneezing" (02/20/2012) too   Molds & Smuts Shortness Of Breath   Morphine And Related Hives and Itching   Other Shortness Of Breath and Other (See Comments)    Grass and weeds "sneezing; filled sinuses" (02/20/2012)   Penicillins Rash and Other (See Comments)    "welts" (02/20/2012)  Tolerated Ancef 02/16/20   Rofecoxib Swelling and Other (See Comments)    Vioxx- feet swelling  Shellfish Allergy Anaphylaxis, Shortness Of Breath, Itching and Rash   Shrimp Extract Anaphylaxis and Other (See Comments)    ALL SHELLFISH   Tetracycline Hcl Nausea And Vomiting   Tetracycline Hcl Other (See Comments)    GI Intolerance   Xolair [Omalizumab] Other (See Comments)    Caused Blood clot   Zoledronic Acid Other (See Comments)    Reclast- Fever, Put in hospital, dr said it was a reaction from a reaction    Fever, inability to walk, Put in hospital  Fever, Put in hospital, dr said it was a reaction from a reaction , Other reaction(s): Unknown   Dilaudid [Hydromorphone Hcl] Itching   Hydrocodone-Acetaminophen Itching   Levofloxacin Other (See Comments)    GI upset  Other Reaction(s): GI Intolerance, Other (See Comments)  REACTION: GI upset, GI upset   Oxycodone Hcl Nausea And Vomiting   Oxycodone Hcl Other (See Comments)    GI Intolerance   Paroxetine Nausea And Vomiting and Other (See Comments)    GI Intolerance also   Celecoxib Swelling and Other (See Comments)    Feet swelling   Diltiazem Hcl Swelling   Lactose Other (See Comments)    Bloating and gas   Lactose Intolerance (Gi) Other (See Comments)    Bloating and gas   Oxycodone-Acetaminophen Itching   Rituximab Other (See Comments)    Anemia    Tree Extract Other (See Comments)    "tested and told I was allergic to it; never  experienced a reaction to it" (02/20/2012)   Gamunex [Immune Globulin] Itching and Rash    Tolerates hizentra    Penicillin G Procaine Rash and Other (See Comments)    Welts, also    Social History:   Social History   Socioeconomic History   Marital status: Divorced    Spouse name: Not on file   Number of children: 2   Years of education: college   Highest education level: Not on file  Occupational History   Occupation: Disabled    Comment: Retired Engineer, production: RETIRED  Tobacco Use   Smoking status: Never    Passive exposure: Past   Smokeless tobacco: Never   Tobacco comments:    Parents  Vaping Use   Vaping Use: Never used  Substance and Sexual Activity   Alcohol use: Not Currently    Alcohol/week: 0.0 standard drinks of alcohol   Drug use: No   Sexual activity: Not Currently    Birth control/protection: Surgical    Comment: Hysterectomy  Other Topics Concern   Not on file  Social History Narrative   Patient lives at home alone. Patient  divorced.    Patient has her BS degree.   Right handed.   Caffeine- sometimes coffee.      [facility]:   Born in [place], [place]. She worked as a Copywriter, advertising. She has no pets currently. She does have indoor plants. Previously had mold in her home that was remediated. Carpet was removed.          Social Determinants of Health   Financial Resource Strain: Low Risk  (04/18/2022)   Overall Financial Resource Strain (CARDIA)    Difficulty of Paying Living Expenses: Not hard at all  Food Insecurity: No Food Insecurity (04/25/2022)   Hunger Vital Sign    Worried About Running Out of Food in the Last Year: Never true    Ran Out of Food in the Last Year: Never true  Transportation  Needs: No Transportation Needs (04/25/2022)   PRAPARE - Hydrologist (Medical): No    Lack of Transportation (Non-Medical): No  Physical Activity: Insufficiently Active (07/13/2021)   Exercise  Vital Sign    Days of Exercise per Week: 5 days    Minutes of Exercise per Session: 20 min  Stress: No Stress Concern Present (07/13/2021)   [place]    Feeling of Stress : Only a little  Social Connections: Moderately Isolated (07/13/2021)   Social Connection and Isolation Panel [NHANES]    Frequency of Communication with Friends and Family: More than three times a week    Frequency of Social Gatherings with Friends and Family: More than three times a week    Attends Religious Services: 1 to 4 times per year    Active Member of [employer] or Organizations: No    Attends Archivist Meetings: Never    Marital Status: Divorced  Human resources officer Violence: Not At Risk (04/25/2022)   Humiliation, Afraid, Rape, and Kick questionnaire    Fear of Current or Ex-Partner: No    Emotionally Abused: No    Physically Abused: No    Sexually Abused: No    Family History:   Family History  Problem Relation Age of Onset   Allergies Mother    Heart disease Mother    Arthritis Mother    Lung cancer Mother        heavy smoker   Diabetes Mother    Allergies Father    Heart disease Father    Arthritis Father    Stroke Father    Heart disease Brother    Diabetes Maternal Grandfather    Colitis Daughter    Colon cancer Other        Maternal half uncle     ROS:  Please see the history of present illness.  Review of Systems  Unable to perform ROS: Mental status change   Physical Exam/Data:   Vitals:   04/26/22 0148 04/26/22 0636 04/26/22 0834 04/26/22 1125  BP: (!) 126/58 (!) 131/57  (!) 139/58  Pulse: 94 88  94  Resp: 16 16  (!) 24  Temp: 99.9 F (37.7 C) 99.3 F (37.4 C)  (!) 102.7 F (39.3 C)  TempSrc: Oral Oral  Oral  SpO2: 97% 98% 97% 95%  Weight:      Height:        Intake/Output Summary (Last 24 hours) at 04/26/2022 1159 Last data filed at 04/26/2022 0636 Gross per 24 hour  Intake 1159.94 ml  Output 300 ml   Net 859.94 ml      04/25/2022   11:40 AM 04/06/2022    9:36 AM 03/19/2022   10:56 AM  Last 3 Weights  Weight (lbs) 137 lb 137 lb 137 lb  Weight (kg) 62.143 kg 62.143 kg 62.143 kg     Body mass index is 25.89 kg/m.  General: 72 y.o. Caucasian female in no acute distress. Lethargic. HEENT: Normocephalic and atraumatic. Periorbital erythema. Neck: Supple. Difficult to assess JVD due to patient position but does not appear elevated. Heart: RRR. Distinct S1 and S2. Very soft I/VI murmur noted on exam. Lungs: No increased work of breathing. Clear to ausculation bilaterally. No wheezes, rhonchi, or rales.  Abdomen: Soft, non-distended, and non-tender to palpation.  Extremities: No lower extremity edema.    Skin: Warm and dry. Neuro: Lethargic. No focal deficits. Psych: Lethargic.   EKG:  The  EKG was personally reviewed and demonstrates:  Normal sinus rhythm, rate 97 bpm, with mild ST elevation primarily in lead V2 but no acute ST/T changes compared to prior tracing.  Prolonged QTc of 530 ms. Telemetry:  Telemetry was personally reviewed and demonstrates:  normals sinus rhythm with rates in the 90s.  Relevant CV Studies:  Right/ Left Cardiac Catheterization 01/11/2021:   Prox LAD lesion is 95% stenosed proximal to 1st Diag &  Mid LAD lesion is 90% stenosed following   Mid LAD lesion is 90% stenosed.   A drug-eluting stent was successfully placed covering both lesions using a STENT ONYX FRONTIER 2.25X34.   Post intervention, there is a 0% residual stenosis.  --------------------------------------------------------------   Hemodynamic findings consistent with moderate pulmonary hypertension.   There is no aortic valve stenosis.   Summary: Severe single-vessel CAD with 95% and 90% stenoses of the proximal and mid LAD at major 1st Diag branch.  Likely be culprit for reduced EF and anterior hypokinesis on echo. Successful DES PCI of the LAD using an Onyx Frontier DES 2.25 mm x 34 mm  postdilated in tapered fashion from 2.6 to 2.55m.   TIMI III flow pre and post, reduced to 0% Otherwise minimal CAD in the Right dominant system. 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 Moderate to severely reduced cardiac function with Cardiac Output-Index of 4.05-2.59 by Fick and 3.75-2.40 by Thermal Dilution.   Recommendations: Continue to titrate medical management of chronic Systolic and diastolic heart failure.  She was given 40 mg IV Lasix on the Cath Lab table.  Recommend that she takes 40 mg twice daily oral Lasix for 3 days postprocedure.   She will need close follow-up with either primary cardiologist or APP to initiate titration of medications. Clinically stable for d/c today  Diagnostic Dominance: Right  Intervention    _______________  Echocardiogram 05/25/2021: Impressions: 1. Septal and apical akinesis distal anterior wall and distal inferior  wall hypokinesis EF similar to TTE done 09/16/20. Left ventricular ejection  fraction, by estimation, is 30 to 35%. The left ventricle has moderately  decreased function. The left  ventricle has no regional wall motion abnormalities. The left ventricular  internal cavity size was moderately dilated. Left ventricular diastolic  parameters are consistent with Grade I diastolic dysfunction (impaired  relaxation). Elevated left  ventricular end-diastolic pressure.   2. Right ventricular systolic function is normal. The right ventricular  size is normal.   3. The mitral valve is normal in structure. Mild mitral valve  regurgitation. No evidence of mitral stenosis.   4. Calcified non coronary cusp . The aortic valve is tricuspid. There is  mild calcification of the aortic valve. Aortic valve regurgitation is not  visualized. Aortic valve sclerosis is present, with no evidence of aortic  valve  stenosis.   5. The inferior vena cava is normal in size with greater than 50%  respiratory variability, suggesting right atrial pressure of 3 mmHg.      Laboratory Data:  High Sensitivity Troponin:   Recent Labs  Lab 04/25/22 1146 04/25/22 1346  TROPONINIHS 112* 130*     Chemistry Recent Labs  Lab 04/25/22 1146 04/26/22 0556  NA 141 141  K 3.5 4.1  CL 105 112*  CO2 24 15*  GLUCOSE 97 59*  BUN 29* 28*  CREATININE 1.81* 1.71*  CALCIUM 9.3 8.5*  MG  --  2.0  GFRNONAA 29* 31*  ANIONGAP 12 14    Recent Labs  Lab 04/25/22 1146 04/26/22 0556  PROT 7.6 6.4*  ALBUMIN 3.5 3.0*  AST 38 35  ALT 25 21  ALKPHOS 53 45  BILITOT 1.2 1.7*   Lipids No results for input(s): "CHOL", "TRIG", "HDL", "LABVLDL", "LDLCALC", "CHOLHDL" in the last 168 hours.  Hematology Recent Labs  Lab 04/25/22 1146 04/26/22 0556  WBC 17.8* 14.4*  RBC 3.65* 3.20*  HGB 12.8 11.3*  HCT 39.6 36.8  MCV 108.5* 115.0*  MCH 35.1* 35.3*  MCHC 32.3 30.7  RDW 15.3 15.4  PLT 182 135*   Thyroid No results for input(s): "TSH", "FREET4" in the last 168 hours.  BNP Recent Labs  Lab 04/25/22 1146  BNP 1,049.7*    DDimer No results for input(s): "DDIMER" in the last 168 hours.   Radiology/Studies:  CT ABDOMEN PELVIS WO CONTRAST  Result Date: 04/25/2022 CLINICAL DATA:  Abdominal pain EXAM: CT ABDOMEN AND PELVIS WITHOUT CONTRAST TECHNIQUE: Multidetector CT imaging of the abdomen and pelvis was performed following the standard protocol without IV contrast. RADIATION DOSE REDUCTION: This exam was performed according to the departmental dose-optimization program which includes automated exposure control, adjustment of the mA and/or kV according to patient size and/or use of iterative reconstruction technique. COMPARISON:  10/04/2020 FINDINGS: Lower chest: Moderate patchy infiltrates are seen in right lower lung fields. Small patchy infiltrates are seen in left lower lung field. There is no pleural effusion.  Hepatobiliary: Surgical clips are seen in gallbladder fossa. There is no dilation of bile ducts. Pancreas: There is fatty infiltration. Spleen: Spleen is not enlarged. Adrenals/Urinary Tract: Adrenals are unremarkable. There is no hydronephrosis. There are no renal or ureteral stones. Urinary bladder is unremarkable. Stomach/Bowel: Small hiatal hernia is seen. Small bowel loops are not dilated. Appendix is not distinctly visualized. There is no pericecal inflammation. There is diffuse wall thickening in rectum. There is stranding in the perirectal fat planes without any loculated fluid collections. Vascular/Lymphatic: Scattered calcifications are seen in aorta and its major branches including coronary arteries. Reproductive: Uterus is not seen. Other: There is no ascites or pneumoperitoneum. There are coarse calcifications in left breast. Musculoskeletal: Degenerative changes are noted in lumbar spine, more severe at L4-L5 level with spinal stenosis and encroachment of neural foramina. IMPRESSION: There is abnormal diffuse wall thickening in rectum along with perirectal stranding. Findings suggest acute proctitis. Possibility of underlying neoplastic process is not excluded. When the patient's clinical condition permits, endoscopy should be considered. There are patchy infiltrates in both lower lung fields, more so on the right side suggesting atelectasis/pneumonia. There is no evidence of intestinal obstruction or pneumoperitoneum. There is no hydronephrosis. Arteriosclerosis.  Lumbar spondylosis. Other findings as described in the body of the report. Electronically Signed   By: Elmer Picker M.D.   On: 04/25/2022 14:25   CT Head Wo Contrast  Result Date: 04/25/2022 CLINICAL DATA:  Altered mental status EXAM: CT HEAD WITHOUT CONTRAST TECHNIQUE: Contiguous axial images were obtained from the base of the skull through the vertex without intravenous contrast. RADIATION DOSE REDUCTION: This exam was performed  according to the departmental dose-optimization program which includes automated exposure control, adjustment of the mA and/or kV according to patient size and/or use of iterative reconstruction technique. COMPARISON:  02/23/2020 FINDINGS: Brain: No acute intracranial findings are seen. There are no signs of bleeding within the cranium. Cortical sulci are prominent. There is decreased density in periventricular and subcortical white matter. Vascular: Unremarkable.  Skull: No fracture is seen in calvarium. Sinuses/Orbits: There is mucosal thickening in the ethmoid sinus. Other: None. IMPRESSION: No acute intracranial findings are seen in noncontrast CT brain. Atrophy. Small-vessel disease. Electronically Signed   By: Elmer Picker M.D.   On: 04/25/2022 14:15   DG Chest Port 1 View  Result Date: 04/25/2022 CLINICAL DATA:  Fever EXAM: PORTABLE CHEST 1 VIEW COMPARISON:  X-ray 02/04/2021.  CT 04/16/2022 FINDINGS: Enlarged cardiopericardial silhouette with vascular congestion. No pneumothorax, effusion. No consolidation. Overlapping cardiac leads. Film is under penetrated. Fixation hardware along the right shoulder and cervical spine at the edge of the imaging field. IMPRESSION: Enlarged cardiopericardial silhouette with vascular congestion. Electronically Signed   By: Jill Side M.D.   On: 04/25/2022 13:10     Assessment and Plan:   Chronic Combined CHF Last echo in 05/2021 showed LVEF of 30-35% with septal and apical akinesis and hypokinesis of anterior wall and distal inferior wall as well as grade 1 diastolic dysfunction.  RV normal.  She presented with altered mental status and was admitted for sepsis secondary to possible community-acquired and known pneumonia and possible acute proctitis.  BNP elevated at 1149.  X-ray showed enlarged cardio pericardial silhouette with vascular congestion.  She has been treated with gentle IV fluids. - Appears euvolemic on exam. - Home Lasix and GDMT (Valsartan,  Bidil, Jardiance) on hold in setting of sepsis.  She remains febrile this afternoon.  Continue to hold these and can restart as sepsis is treated. - Of note, she has had labile BP in the past GDMT has been limited.  She was previously on bisoprolol but this was stopped secondary to hypotension. - Continue to monitor volume status closely. Would limit IV fluids as much as possible.  Elevated Troponin  CAD S/p DES to LAD and 1st Diag in 12/2020. High sensitivity troponin mildly elevated and flat at 112 >> 130. - Patient presented with altered mental status but denies any chest pain to me.  - Continue aspirin and statin. - Troponin elevation is consistent with demand ischemic in setting of sepsis. No need for ischemic evaluation.  Hyperlipidemia - Continue Lipitor '80mg'$  daily.   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   CKD Stage IIIb Creatinine 1.81 on admission. Baseline looks to be around 1.4 to 1.7.  - Stable at 1.71 today. - Continue to monitor closely.    Otherwise, per primary team: - Sepsis secondary to possible CAP and possible acute proctitis  - Altered mental status - Asthma/ bronchiectasis - Tracheobronchomalacia - Rheumatoid arthritis - Hypothyroidism - Type 2 diabetes - Chronic anemia  Risk Assessment/Risk Scores:  For questions or updates, please contact [place] Please consult [email or web address] for contact info under  Signed, Darreld Mclean, PA-C  04/26/2022 11:59 AM  Patient seen and examined, note reviewed with the signed Advanced Practice Provider. I personally reviewed laboratory data, imaging studies and relevant notes. I independently examined the patient and formulated the important aspects of the plan. I have personally discussed the plan with the patient and/or family. Comments or changes to the note/plan are indicated below.  Patient seen and examined at her bedside. She is not at her baseline mental status. Unable to give  history.  Clinically appears to be euvolemic no need for diuretic at this time - would advocate to stop the continuous IVF to avoid fluid overload.  Not taking po due AMS and avoiding aspiration. Resume GDMT once medically safe to do orally.  Elevated trop  suspected to be Type II demand ischemia in the setting of sepsis.  Berniece Salines DO, MS [facility] Attending Cardiologist [place]  [address] [place], [place] [address] [phone] Website: [email or web address]

## 2022-04-26 NOTE — Consult Note (Addendum)
Referring Provider: Dr. Starla Link, [facility] Primary Care Physician:  [name], MD Primary Gastroenterologist:  Dr. Carlean Purl  Reason for Consultation:  Abnormal CT scan with proctitis  HPI: Ebony Scott is a 72 y.o. 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     Patient was unable to give history as she was very sleepy, difficult to arouse.  No family at bedside.  Nurses report that stools are not diarrhea, more just soft/mushy.  No stool studies have been ordered.  She has been started on Rocephin, Zithromax, and Flagyl for pneumonia and proctitis seen on CT scan.  Nurses deny any sign of rectal bleeding.  Temp up to 103, today 102.7.  Leukocytosis yesterday was 17.8 K, down to 14.4 K today.  CT scan of the abdomen and pelvis without contrast showed the following:  IMPRESSION: There is abnormal diffuse wall thickening in rectum along with perirectal stranding. Findings suggest acute proctitis. Possibility of underlying neoplastic process is not excluded. When the patient's clinical condition permits, endoscopy should be considered.   There are patchy infiltrates in both lower lung fields, more so on the right side suggesting atelectasis/pneumonia.   There is no evidence of intestinal obstruction or pneumoperitoneum. There is no hydronephrosis.   Arteriosclerosis.  Lumbar spondylosis.   Other findings as described in the body of the report.  Last colonoscopy April 04, 2016 at which time she had 3 diminutive polyps removed.  These were tubular adenomas.  Past Medical History:  Diagnosis Date   Abscess of dorsum of right hand 09/22/2020   Acute deep vein thrombosis  (DVT) of right lower extremity ([place]) 12/13/2017   Allergic rhinitis    Anemia    Angio-edema    Anxiety    pt denies dx   Arthritis    Phreesia 10/21/2019   Asthma    Phreesia 10/21/2019   Breast cancer ([place]) 1998   Left breast, in remission   Bronchiectasis ([place])    Cataract    REMOVED   CHF (congestive heart failure) ([place])    Clostridium difficile colitis [id number]   Complication of anesthesia    "had hard time waking up from it several times" (02/20/2012), no anesthesia problems since 2014   Coronary artery disease    Depression    "some; don't take anything for it" (02/20/2012), pt denies dx as of 06/02/21   Diverticulosis    DVT (deep venous thrombosis) ([place])    Dyspnea    all the time   Fibromyalgia 11/2011   GAVE (gastric antral vascular ectasia)    GERD (gastroesophageal reflux disease)    Graves disease    Headache(784.0)    "related to allergies; more at different times during the year" (02/20/2012)   Hemorrhoids    Hiatal hernia    back and neck   History of blood transfusion 2023   1 unit, 5 units of Iron   Hx of adenomatous colonic polyps 04/12/2016   Hypercholesteremia    good cholesterol is high   Hypothyroidism    IBS (irritable bowel syndrome)    Moderate persistent asthma    -FeV1 72% 2011, -IgE 102 2011, CT sinus Neg 2011   Osteomyelitis of second toe of right foot ([place]) 09/23/2020   Osteoporosis    on reclast yearly  Peripheral vascular disease ([place]) 2019   DVTs (3) lungs and leg   Pneumonia 04/2011; ~ 11/2011   "double; single" (02/20/2012)   Septic olecranon bursitis of right elbow 09/22/2020   Seronegative rheumatoid arthritis ([place])    Dr. Lahoma Rocker   SIRS (systemic inflammatory response syndrome) ([place]) 02/10/2018   Sleep apnea    uses cpap nightly   Tracheobronchomalacia     Past Surgical History:  Procedure Laterality Date   ABDOMINAL HYSTERECTOMY N/A    Phreesia 10/21/2019   ANTERIOR AND POSTERIOR REPAIR  1990's   APPENDECTOMY      ARGON LASER APPLICATION  [id number]   Procedure: ARGON LASER APPLICATION;  Surgeon: Yetta Flock, MD;  [facility]: [facility];  Service: Gastroenterology;;   BREAST LUMPECTOMY  1998   left   BREAST SURGERY N/A    Phreesia 10/21/2019   BRONCHIAL WASHINGS  04/05/2020   Procedure: BRONCHIAL WASHINGS;  Surgeon: Freddi Starr, MD;  [facility];  Service: Pulmonary;;   CAPSULOTOMY Right 08/28/2021   Procedure: CAPSULOTOMY;  Surgeon: Felipa Furnace, DPM;  Location: [place];  Service: Podiatry;  Laterality: Right;   CARPOMETACARPEL ([place]) FUSION OF THUMB WITH AUTOGRAFT FROM RADIUS  ~ 2009   "both thumbs" (02/20/2012)   CATARACT EXTRACTION W/ INTRAOCULAR LENS  IMPLANT, BILATERAL  2012   CERVICAL DISCECTOMY  10/2001   C5-C6   CERVICAL FUSION  2003   C3-C4   CHOLECYSTECTOMY     COLONOSCOPY     CORONARY STENT INTERVENTION N/A 01/11/2021   Procedure: CORONARY STENT INTERVENTION;  Surgeon: Leonie Man, MD;  Location: [facility];  Service: Cardiovascular;  Laterality: N/A;   DEBRIDEMENT TENNIS ELBOW  ?1970's   right   ESOPHAGOGASTRODUODENOSCOPY     ESOPHAGOGASTRODUODENOSCOPY (EGD) WITH PROPOFOL N/A 02/05/2021   Procedure: ESOPHAGOGASTRODUODENOSCOPY (EGD) WITH PROPOFOL;  Surgeon: Yetta Flock, MD;  [facility];  Service: Gastroenterology;  Laterality: N/A;   EYE SURGERY N/A    Phreesia 10/21/2019   HAMMER TOE SURGERY Right 08/28/2021   Procedure: HAMMER TOE REPAIR 2ND TOE RIGHT FOOT;  Surgeon: Felipa Furnace, DPM;  Location: [place];  Service: Podiatry;  Laterality: Right;   HYSTERECTOMY     I & D EXTREMITY Right 09/22/2020   Procedure: IRRIGATION AND DEBRIDEMENT RIGHT HAND AND ELBOW;  Surgeon: Marchia Bond, MD;  [facility];  Service: Orthopedics;  Laterality: Right;   KNEE ARTHROPLASTY  ?1990's   "?right; w/cartilage repair" (02/20/2012)   MASS EXCISION Left 08/28/2021   Procedure: EXCISION OF SOFT TISSUE  MASS LEFT FOOT;  Surgeon: Felipa Furnace, DPM;  Location: [facility];  Service: Podiatry;  Laterality: Left;   NASAL SEPTUM SURGERY  1980's   POSTERIOR CERVICAL FUSION/FORAMINOTOMY  2004   "failed initial fusion; rewired  anterior neck" (02/20/2012)   REVERSE SHOULDER ARTHROPLASTY Right 02/16/2020   Procedure: REVERSE SHOULDER ARTHROPLASTY;  Surgeon: Marchia Bond, MD;  [facility];  Service: Orthopedics;  Laterality: Right;   RIGHT/LEFT HEART CATH AND CORONARY ANGIOGRAPHY N/A 01/11/2021   Procedure: RIGHT/LEFT HEART CATH AND CORONARY ANGIOGRAPHY;  Surgeon: Leonie Man, MD;  Location: [facility];  Service: Cardiovascular;  Laterality: N/A;   SPINE SURGERY N/A    Phreesia 10/21/2019   TONSILLECTOMY  ~ 1953   VESICOVAGINAL FISTULA CLOSURE W/ TAH  1988   VIDEO BRONCHOSCOPY Bilateral 08/23/2016   Procedure: VIDEO BRONCHOSCOPY WITH FLUORO;  Surgeon: Javier Glazier, MD;  [facility];  Service: Cardiopulmonary;  Laterality:  Bilateral;   VIDEO BRONCHOSCOPY N/A 04/05/2020   Procedure: VIDEO BRONCHOSCOPY WITHOUT FLUORO;  Surgeon: Freddi Starr, MD;  [facility];  Service: Pulmonary;  Laterality: N/A;    Prior to Admission medications   Medication Sig Start Date End Date Taking? Authorizing Provider  acetaminophen (TYLENOL) 650 MG CR tablet Take 1,300 mg by mouth daily as needed for pain.   Yes [provider]  acetic acid-hydrocortisone (VOSOL-HC) OTIC solution Place 4 drops into both ears See admin instructions. Instill 4 drops into both ears the first 5 days of the month, then as directed/as needed for itching   Yes [provider]  albuterol (VENTOLIN HFA) 108 (90 Base) MCG/ACT inhaler Inhale 2 puffs into the lungs every 6 (six) hours as needed for wheezing or shortness of breath. 03/01/21  Yes Kozlow, Donnamarie Poag, MD  allopurinol (ZYLOPRIM) 100 MG tablet TAKE 1 TABLET BY MOUTH DAILY Patient taking differently: Take 100 mg by mouth daily with supper. 02/15/22  Yes [name], MD   Alpha-D-Galactosidase (BEANO PO) Take 2 tablets by mouth 3 (three) times daily before meals.   Yes [provider]  Alpha-Lipoic Acid 600 MG TABS Take 600 mg by mouth daily.   Yes [provider]  ammonium lactate (AMLACTIN DAILY) 12 % lotion Apply 1 Application topically as needed for dry skin. 03/07/22  Yes Felipa Furnace, DPM  atorvastatin (LIPITOR) 80 MG tablet TAKE 1 TABLET BY MOUTH DAILY Patient taking differently: Take 80 mg by mouth daily with supper. 02/15/22  Yes [name], MD  benzonatate (TESSALON) 200 MG capsule TAKE 1 CAPSULE BY MOUTH THREE TIMES DAILY AS NEEDED FOR COUGH Patient taking differently: Take 200 mg by mouth 2 (two) times daily as needed for cough. 07/13/21  Yes [name], MD  Calcium-Magnesium-Vitamin D (CALCIUM MAGNESIUM PO) Take 1 tablet by mouth at bedtime.   Yes [provider]  cetirizine (ZYRTEC) 10 MG tablet Take 1 tablet (10 mg total) by mouth 2 (two) times daily as needed for allergies (Can take a n extra dose during flare ups.). Patient taking differently: Take 10 mg by mouth at bedtime. 06/13/21  Yes Kozlow, Donnamarie Poag, MD  Cholecalciferol (VITAMIN D3) 25 MCG (1000 UT) capsule Take 1,000 Units by mouth at bedtime.   Yes [provider]  clopidogrel (PLAVIX) 75 MG tablet Take 1 tablet (75 mg total) by mouth daily. 06/02/21  Yes Loel Dubonnet, NP  cyproheptadine (PERIACTIN) 4 MG tablet Take 2 tablets (8 mg total) by mouth at bedtime. Take 2 tablets at bedtime as needed to treat facial pain or sleep dysfunction. Patient taking differently: Take 8 mg by mouth at bedtime. 06/13/21  Yes Kozlow, Donnamarie Poag, MD  denosumab (PROLIA) 60 MG/ML SOSY injection Inject 60 mg into the skin every 6 (six) months.   Yes [provider]  dexlansoprazole (DEXILANT) 60 MG capsule Take 1 capsule (60 mg total) by mouth daily. 06/13/21  Yes Kozlow, Donnamarie Poag, MD  diclofenac Sodium (VOLTAREN) 1 % GEL Apply 1 Application topically 4 (four)  times daily as needed (pain). 11/13/21  Yes [name], MD  empagliflozin (JARDIANCE) 10 MG TABS tablet Take 1 tablet (10 mg total) by mouth daily before breakfast. 04/11/22  Yes [name], MD  EPINEPHrine 0.3 mg/0.3 mL IJ SOAJ injection USE AS DIRECTED BY YOUR PHYSICIAN INTRAMUSCULARLY AS NEEDED FOR ANAPHYLAXIS Patient taking differently: Inject 0.3 mg into the muscle as needed for anaphylaxis. 01/26/22  Yes Kozlow,  Donnamarie Poag, MD  famotidine (PEPCID) 20 MG tablet TAKE 1 TABLET BY MOUTH EACH NIGHT AT BEDTIME Patient taking differently: Take 20 mg by mouth at bedtime. 04/04/22  Yes Kozlow, Donnamarie Poag, MD  Fluocinolone Acetonide (DERMOTIC) 0.01 % OIL Place 5 drops into both ears See admin instructions. INSTILL 5 DROPS INTO THE AFFECTED EAR(S) TWICE DAILY FOR 14 DAYS, THEN STOP AND USE AS NEEDED FOR ITCHING[   Yes [provider]  fluticasone (FLONASE) 50 MCG/ACT nasal spray USE 2 SPRAYS IN EACH NOSTRIL DAILY Patient taking differently: Place 2 sprays into both nostrils daily. 09/02/20  Yes Freddi Starr, MD  folic acid (FOLVITE) 1 MG tablet Take 1 tablet (1 mg total) by mouth daily. 02/16/22  Yes [name], MD  furosemide (LASIX) 40 MG tablet Take 1 tablet (40 mg total) by mouth daily. Patient taking differently: Take 40 mg by mouth in the morning. 11/13/21  Yes Buford Dresser, MD  gabapentin (NEURONTIN) 300 MG capsule TAKE 1 CAPSULE BY MOUTH 3 TIMES DAILY AS NEEDED Patient taking differently: Take 300 mg by mouth at bedtime. 03/07/22  Yes Rice, Resa Miner, MD  guaiFENesin (MUCINEX) 600 MG 12 hr tablet Take 1 tablet (600 mg total) by mouth 2 (two) times daily as needed for cough or to loosen phlegm. Patient taking differently: Take 600 mg by mouth 2 (two) times daily. 05/09/20  Yes [name], MD  HIZENTRA 1 GM/5ML SOLN 13 g by Subcutaneous Infusion route See admin instructions. Hizentra- 13 g infused every Friday  ("3 times/5 ml = 65 ml total once weekly")  08/09/21  Yes [provider]  hydroxychloroquine (PLAQUENIL) 200 MG tablet Take 1 tablet (200 mg total) by mouth daily. 03/07/22  Yes Rice, Resa Miner, MD  ipratropium-albuterol (DUONEB) 0.5-2.5 (3) MG/3ML SOLN Take 3 mLs by nebulization every 6 (six) hours as needed. Patient taking differently: Take 3 mLs by nebulization every 6 (six) hours as needed (for shortness of breath or wheezing). 11/14/21  Yes Freddi Starr, MD  isosorbide-hydrALAZINE (BIDIL) 20-37.5 MG tablet Take 1 tablet by mouth 2 (two) times daily. Patient taking differently: Take 1 tablet by mouth daily with supper. 04/11/22  Yes [name], MD  Lactase (LACTOSE FAST ACTING RELIEF PO) Take 3 tablets by mouth 3 (three) times daily before meals.   Yes [provider]  lidocaine-prilocaine (EMLA) cream Apply 1 application  topically as needed (pain). 04/18/21  Yes [provider]  magnesium oxide (MAG-OX) 400 (240 Mg) MG tablet Take 400 mg by mouth at bedtime.   Yes [provider]  Menthol, Topical Analgesic, (BIOFREEZE) 4 % GEL Apply 1 application  topically daily as needed (for shoulder or joint pain).   Yes [provider]  mirtazapine (REMERON SOL-TAB) 30 MG disintegrating tablet Take 1 tablet (30 mg total) by mouth at bedtime as needed (sleep). Patient taking differently: Take 30 mg by mouth at bedtime. 11/13/21  Yes [name], MD  montelukast (SINGULAIR) 10 MG tablet Take 1 tablet (10 mg total) by mouth daily. Patient taking differently: Take 10 mg by mouth daily after supper. 04/11/22  Yes [name], MD  Nystatin (GERHARDT'S BUTT CREAM) CREA Apply topically 3 times a day as needed for itching or irritation 12/30/20  Yes [name], MD  nystatin (MYCOSTATIN) 100000 UNIT/ML suspension Use as directed 15 mLs in the mouth or throat at bedtime.   Yes [provider]  potassium chloride (KLOR-CON M) 10 MEQ tablet Take  1 tablet (10 mEq total) by mouth  daily. Patient taking differently: Take 10 mEq by mouth daily with supper. 12/21/21  Yes Orson Slick, MD  predniSONE (DELTASONE) 1 MG tablet Take 2 tablets (2 mg total) by mouth daily. Total of 7 mg daily. Patient taking differently: Take 2 mg by mouth daily with breakfast. Total of 7 mg daily. 02/14/22  Yes Rice, Resa Miner, MD  predniSONE (DELTASONE) 5 MG tablet Take 1 tablet (5 mg total) by mouth daily with breakfast. Along with 2 mg tablets for total daily dose 7 mg total daily 02/14/22  Yes Rice, Resa Miner, MD  Prenatal Vit-Fe Fumarate-FA (PRENATAL VITAMINS PO) Take 1 tablet by mouth daily.   Yes [provider]  Probiotic Product (ALIGN PO) Take 1 capsule by mouth daily.   Yes [provider]  Propylene Glycol (SYSTANE COMPLETE) 0.6 % SOLN Place 1 drop into both eyes 2 (two) times daily. 03/01/21  Yes Kozlow, Donnamarie Poag, MD  Tiotropium Bromide-Olodaterol (STIOLTO RESPIMAT) 2.5-2.5 MCG/ACT AERS Inhale 2 puffs into the lungs daily. Patient taking differently: Inhale 2 puffs into the lungs at bedtime. 08/15/21  Yes Freddi Starr, MD  traMADol (ULTRAM) 50 MG tablet Take 1 tablet (50 mg total) by mouth at bedtime. 04/12/22  Yes [name], MD  valsartan (DIOVAN) 40 MG tablet Take 1 tablet (40 mg total) by mouth daily. Patient taking differently: Take 40 mg by mouth daily with supper. 12/21/21  Yes Orson Slick, MD  XARELTO 20 MG TABS tablet TAKE 1 TABLET BY MOUTH DAILY WITH SUPPER Patient taking differently: Take 20 mg by mouth daily with supper. 02/15/22  Yes [name], MD  azelastine (ASTELIN) 0.1 % nasal spray Place 2 sprays into both nostrils daily. Use in each nostril as directed 06/13/21   Kozlow, Donnamarie Poag, MD  budesonide (PULMICORT) 1 MG/2ML nebulizer solution Take 2 mLs (1 mg total) by nebulization daily. Patient not taking: Reported on 04/25/2022 11/24/21   Mar Daring, PA-C  metroNIDAZOLE (FLAGYL) 250 MG tablet Take 1 tablet (250 mg  total) by mouth 3 (three) times daily. Patient not taking: Reported on 04/25/2022 04/06/22   Gatha Mayer, MD    Current Facility-Administered Medications  Medication Dose Route Frequency Provider Last Rate Last Admin   0.9 %  sodium chloride infusion   Intravenous Continuous Aline August, MD 50 mL/hr at 04/26/22 1017 New Bag at 04/26/22 1017   allopurinol (ZYLOPRIM) tablet 100 mg  100 mg Oral Q supper Aline August, MD   100 mg at 04/25/22 2100   arformoterol (BROVANA) nebulizer solution 15 mcg  15 mcg Nebulization BID Aline August, MD   15 mcg at 04/26/22 0834   And   umeclidinium bromide (INCRUSE ELLIPTA) 62.5 MCG/ACT 1 puff  1 puff Inhalation Daily Aline August, MD   1 puff at 04/26/22 0835   atorvastatin (LIPITOR) tablet 80 mg  80 mg Oral Q supper Aline August, MD       azithromycin (ZITHROMAX) 500 mg in sodium chloride 0.9 % 250 mL IVPB  500 mg Intravenous Q24H Aline August, MD   Stopped at 04/25/22 2203   benzonatate (TESSALON) capsule 200 mg  200 mg Oral BID PRN Aline August, MD       cefTRIAXone (ROCEPHIN) 2 g in sodium chloride 0.9 % 100 mL IVPB  2 g Intravenous Q24H Alekh, Kshitiz, MD 200 mL/hr at 04/26/22 1122 2 g at 04/26/22 1122   ciprofloxacin (CILOXAN) 0.3 %  ophthalmic solution 1 drop  1 drop Both Eyes Q4H while awake Alekh, Kshitiz, MD       clopidogrel (PLAVIX) tablet 75 mg  75 mg Oral Daily Alekh, Kshitiz, MD       diclofenac Sodium (VOLTAREN) 1 % topical gel 1 Application  1 Application Topical QID PRN Aline August, MD       famotidine (PEPCID) tablet 20 mg  20 mg Oral QHS Alekh, Kshitiz, MD   20 mg at 04/25/22 2100   fluticasone (FLONASE) 50 MCG/ACT nasal spray 2 spray  2 spray Each Nare Daily Aline August, MD       folic acid (FOLVITE) tablet 1 mg  1 mg Oral Daily Alekh, Kshitiz, MD       gabapentin (NEURONTIN) capsule 300 mg  300 mg Oral QHS Alekh, Kshitiz, MD   300 mg at 04/25/22 2100   guaiFENesin (MUCINEX) 12 hr tablet 600 mg  600 mg Oral BID Aline August, MD   600 mg at 04/25/22 2100   ipratropium-albuterol (DUONEB) 0.5-2.5 (3) MG/3ML nebulizer solution 3 mL  3 mL Nebulization Q6H PRN Aline August, MD       loratadine (CLARITIN) tablet 10 mg  10 mg Oral Daily PRN Aline August, MD       metroNIDAZOLE (FLAGYL) IVPB 500 mg  500 mg Intravenous Q12H Aline August, MD   Stopped at 04/26/22 0108   montelukast (SINGULAIR) tablet 10 mg  10 mg Oral QPC supper Aline August, MD       ondansetron (ZOFRAN) tablet 4 mg  4 mg Oral Q6H PRN Aline August, MD       Or   ondansetron (ZOFRAN) injection 4 mg  4 mg Intravenous Q6H PRN Alekh, Kshitiz, MD       polyvinyl alcohol (LIQUIFILM TEARS) 1.4 % ophthalmic solution 1 drop  1 drop Both Eyes BID Starla Link, Kshitiz, MD   1 drop at 04/25/22 2101   rivaroxaban (XARELTO) tablet 20 mg  20 mg Oral Q supper Aline August, MD   20 mg at 04/25/22 2101   traMADol (ULTRAM) tablet 100 mg  100 mg Oral Q12H PRN Aline August, MD       Facility-Administered Medications Ordered in Other Encounters  Medication Dose Route Frequency Provider Last Rate Last Admin   iohexol (OMNIPAQUE) 350 MG/ML injection    PRN Leonie Man, MD   105 mL at 01/11/21 0955    Allergies as of 04/25/2022 - Review Complete 04/25/2022  Allergen Reaction Noted   Baclofen Anaphylaxis and Other (See Comments) 02/27/2020   Dust mite extract Shortness Of Breath and Other (See Comments) 07/05/2010   Molds & smuts Shortness Of Breath    Morphine and related Hives and Itching 10/21/2019   Other Shortness Of Breath and Other (See Comments) 11/28/2011   Penicillins Rash and Other (See Comments) 04/25/2022   Rofecoxib Swelling and Other (See Comments) 05/25/2020   Shellfish allergy Anaphylaxis, Shortness Of Breath, Itching, and Rash 10/21/2019   Shrimp extract Anaphylaxis and Other (See Comments) 07/05/2010   Tetracycline hcl Nausea And Vomiting    Tetracycline hcl Other (See Comments) 04/25/2022   Xolair [omalizumab] Other (See Comments)  08/16/2016   Zoledronic acid Other (See Comments) 04/01/2012   Dilaudid [hydromorphone hcl] Itching 02/21/2012   Hydrocodone-acetaminophen Itching 08/18/2013   Levofloxacin Other (See Comments) 08/18/2013   Oxycodone hcl Nausea And Vomiting    Oxycodone hcl Other (See Comments) 04/25/2022   Paroxetine Nausea And Vomiting and Other (See Comments) 04/25/2022  Celecoxib Swelling and Other (See Comments) 05/03/2015   Diltiazem hcl Swelling 08/08/2020   Lactose Other (See Comments) 12/14/2017   Lactose intolerance (gi) Other (See Comments) 12/14/2017   Oxycodone-acetaminophen Itching 05/25/2020   Rituximab Other (See Comments) 01/06/2021   Tree extract Other (See Comments) 09/20/2010   Gamunex [immune globulin] Itching and Rash 05/30/2021   Penicillin g procaine Rash and Other (See Comments) 08/08/2020    Family History  Problem Relation Age of Onset   Allergies Mother    Heart disease Mother    Arthritis Mother    Lung cancer Mother        heavy smoker   Diabetes Mother    Allergies Father    Heart disease Father    Arthritis Father    Stroke Father    Heart disease Brother    Diabetes Maternal Grandfather    Colitis Daughter    Colon cancer Other        Maternal half uncle    Social History   Socioeconomic History   Marital status: Divorced    Spouse name: Not on file   Number of children: 2   Years of education: college   Highest education level: Not on file  Occupational History   Occupation: Disabled    Comment: Retired Engineer, production: RETIRED  Tobacco Use   Smoking status: Never    Passive exposure: Past   Smokeless tobacco: Never   Tobacco comments:    Parents  Vaping Use   Vaping Use: Never used  Substance and Sexual Activity   Alcohol use: Not Currently    Alcohol/week: 0.0 standard drinks of alcohol   Drug use: No   Sexual activity: Not Currently    Birth control/protection: Surgical    Comment: Hysterectomy  Other  Topics Concern   Not on file  Social History Narrative   Patient lives at home alone. Patient  divorced.    Patient has her BS degree.   Right handed.   Caffeine- sometimes coffee.      [facility]:   Born in [place], [place]. She worked as a Copywriter, advertising. She has no pets currently. She does have indoor plants. Previously had mold in her home that was remediated. Carpet was removed.          Social Determinants of Health   Financial Resource Strain: Low Risk  (04/18/2022)   Overall Financial Resource Strain (CARDIA)    Difficulty of Paying Living Expenses: Not hard at all  Food Insecurity: No Food Insecurity (04/25/2022)   Hunger Vital Sign    Worried About Running Out of Food in the Last Year: Never true    Ran Out of Food in the Last Year: Never true  Transportation Needs: No Transportation Needs (04/25/2022)   PRAPARE - Hydrologist (Medical): No    Lack of Transportation (Non-Medical): No  Physical Activity: Insufficiently Active (07/13/2021)   Exercise Vital Sign    Days of Exercise per Week: 5 days    Minutes of Exercise per Session: 20 min  Stress: No Stress Concern Present (07/13/2021)   [place]    Feeling of Stress : Only a little  Social Connections: Moderately Isolated (07/13/2021)   Social Connection and Isolation Panel [NHANES]    Frequency of Communication with Friends and Family: More than three times a week    Frequency of Social Gatherings with Friends and Family:  More than three times a week    Attends Religious Services: 1 to 4 times per year    Active Member of Clubs or Organizations: No    Attends Archivist Meetings: Never    Marital Status: Divorced  Human resources officer Violence: Not At Risk (04/25/2022)   Humiliation, Afraid, Rape, and Kick questionnaire    Fear of Current or Ex-Partner: No    Emotionally Abused: No    Physically Abused: No     Sexually Abused: No    Review of Systems: ROS is unable to be obtained due to patient being sleepy and difficult to arouse.  Physical Exam: Vital signs in last 24 hours: Temp:  [98.9 F (37.2 C)-102.7 F (39.3 C)] 102.7 F (39.3 C) (03/07 1125) Pulse Rate:  [87-108] 94 (03/07 1125) Resp:  [14-31] 24 (03/07 1125) BP: (108-140)/(47-98) 139/58 (03/07 1125) SpO2:  [95 %-100 %] 95 % (03/07 1125) Last BM Date : 04/25/22 General:  Alert, ill-appearing, sleepy. Head:  Normocephalic and atraumatic. Eyes:  Sclera clear, no icterus.  Conjunctiva pink.  Dark circles and puffiness noted around both of her eyes. Ears:  Normal auditory acuity. Mouth:  No deformity or lesions.   Lungs:  Coarse BS noted. Heart:  Regular rate and rhythm; no murmurs, clicks, rubs, or gallops. Abdomen:  Soft, non-distended.  BS present.  Expresses some lower TTP.   Msk:  Symmetrical without gross deformities. Pulses:  Normal pulses noted. Extremities:  Without clubbing or edema. Neurologic:  Sleepy, difficult to awaken. Skin:  Intact without significant lesions or rashes.  Intake/Output from previous day: 03/06 0701 - 03/07 0700 In: 1159.9 [I.V.:724.6; IV Piggyback:435.3] Out: 300 [Urine:300]  Lab Results: Recent Labs    04/25/22 1146 04/26/22 0556  WBC 17.8* 14.4*  HGB 12.8 11.3*  HCT 39.6 36.8  PLT 182 135*   BMET Recent Labs    04/25/22 1146 04/26/22 0556  NA 141 141  K 3.5 4.1  CL 105 112*  CO2 24 15*  GLUCOSE 97 59*  BUN 29* 28*  CREATININE 1.81* 1.71*  CALCIUM 9.3 8.5*   LFT Recent Labs    04/26/22 0556  PROT 6.4*  ALBUMIN 3.0*  AST 35  ALT 21  ALKPHOS 45  BILITOT 1.7*   PT/INR Recent Labs    04/25/22 1146  LABPROT 14.6  INR 1.2   Studies/Results: CT ABDOMEN PELVIS WO CONTRAST  Result Date: 04/25/2022 CLINICAL DATA:  Abdominal pain EXAM: CT ABDOMEN AND PELVIS WITHOUT CONTRAST TECHNIQUE: Multidetector CT imaging of the abdomen and pelvis was performed following the  standard protocol without IV contrast. RADIATION DOSE REDUCTION: This exam was performed according to the departmental dose-optimization program which includes automated exposure control, adjustment of the mA and/or kV according to patient size and/or use of iterative reconstruction technique. COMPARISON:  10/04/2020 FINDINGS: Lower chest: Moderate patchy infiltrates are seen in right lower lung fields. Small patchy infiltrates are seen in left lower lung field. There is no pleural effusion. Hepatobiliary: Surgical clips are seen in gallbladder fossa. There is no dilation of bile ducts. Pancreas: There is fatty infiltration. Spleen: Spleen is not enlarged. Adrenals/Urinary Tract: Adrenals are unremarkable. There is no hydronephrosis. There are no renal or ureteral stones. Urinary bladder is unremarkable. Stomach/Bowel: Small hiatal hernia is seen. Small bowel loops are not dilated. Appendix is not distinctly visualized. There is no pericecal inflammation. There is diffuse wall thickening in rectum. There is stranding in the perirectal fat planes without any loculated fluid collections. Vascular/Lymphatic: Scattered  calcifications are seen in aorta and its major branches including coronary arteries. Reproductive: Uterus is not seen. Other: There is no ascites or pneumoperitoneum. There are coarse calcifications in left breast. Musculoskeletal: Degenerative changes are noted in lumbar spine, more severe at L4-L5 level with spinal stenosis and encroachment of neural foramina. IMPRESSION: There is abnormal diffuse wall thickening in rectum along with perirectal stranding. Findings suggest acute proctitis. Possibility of underlying neoplastic process is not excluded. When the patient's clinical condition permits, endoscopy should be considered. There are patchy infiltrates in both lower lung fields, more so on the right side suggesting atelectasis/pneumonia. There is no evidence of intestinal obstruction or  pneumoperitoneum. There is no hydronephrosis. Arteriosclerosis.  Lumbar spondylosis. Other findings as described in the body of the report. Electronically Signed   By: Elmer Picker M.D.   On: 04/25/2022 14:25   CT Head Wo Contrast  Result Date: 04/25/2022 CLINICAL DATA:  Altered mental status EXAM: CT HEAD WITHOUT CONTRAST TECHNIQUE: Contiguous axial images were obtained from the base of the skull through the vertex without intravenous contrast. RADIATION DOSE REDUCTION: This exam was performed according to the departmental dose-optimization program which includes automated exposure control, adjustment of the mA and/or kV according to patient size and/or use of iterative reconstruction technique. COMPARISON:  02/23/2020 FINDINGS: Brain: No acute intracranial findings are seen. There are no signs of bleeding within the cranium. Cortical sulci are prominent. There is decreased density in periventricular and subcortical white matter. Vascular: Unremarkable. Skull: No fracture is seen in calvarium. Sinuses/Orbits: There is mucosal thickening in the ethmoid sinus. Other: None. IMPRESSION: No acute intracranial findings are seen in noncontrast CT brain. Atrophy. Small-vessel disease. Electronically Signed   By: Elmer Picker M.D.   On: 04/25/2022 14:15   DG Chest Port 1 View  Result Date: 04/25/2022 CLINICAL DATA:  Fever EXAM: PORTABLE CHEST 1 VIEW COMPARISON:  X-ray 02/04/2021.  CT 04/16/2022 FINDINGS: Enlarged cardiopericardial silhouette with vascular congestion. No pneumothorax, effusion. No consolidation. Overlapping cardiac leads. Film is under penetrated. Fixation hardware along the right shoulder and cervical spine at the edge of the imaging field. IMPRESSION: Enlarged cardiopericardial silhouette with vascular congestion. Electronically Signed   By: Jill Side M.D.   On: 04/25/2022 13:10    IMPRESSION:  *Sepsis: Presented with altered mental status, fever, diarrhea, leukocytosis with  pneumonia and proctitis seen on imaging.  Has been started on Rocephin, Zithromax, Flagyl.  Leukocytosis is improving.  Still febrile up to 102.7.  Suspect proctitis is infectious in nature.  Patient really unable to give history today as she was sleepy and feeling unwell.  No family at bedside.  Not really having diarrhea per nursing reports, more just soft and mushy stool.  No bleeding. *Chronic kidney disease stage IIIb *History of DVT/PE on Xarelto *CAD:  On Plavix. *Personal history of colon polyps on colonoscopy in February 2018.  PLAN: -Continue antibiotics and supportive care with IV hydration, etc. -Suspect flexible sigmoidoscopy only if she fails to improve. -Would likely plan for full colonoscopy if appropriate as an outpatient for her history of colon polyps when her anticoagulation/antiplatelet could be held.   Ebony Scott. Ebony Scott  04/26/2022, 11:43 AM  GI ATTENDING  History, laboratories, x-rays reviewed.  Patient seen and examined.  Agree with comprehensive consultation note as outlined above.  Complex patient patient presents with fever leukocytosis and altered mental status secondary to pneumonia.  Question of proctitis raised on imaging.  Multiple comorbidities.  Agree with aggressive treatment of pneumonia.  Monitor  for signs or symptoms of possible proctitis.  No plans for endoscopic intervention at this time.  Will follow.  Docia Chuck. Geri Seminole., M.D. [facility] Division of Gastroenterology

## 2022-04-26 NOTE — Plan of Care (Signed)

## 2022-04-26 NOTE — Evaluation (Signed)
Physical Therapy Evaluation Patient Details Name: Ebony Scott MRN: [id number] DOB: 1950-03-26 Today's Date: 04/26/2022  History of Present Illness  72 y.o. 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  Pt currently with functional limitations due to the deficits listed below (see PT Problem List). Pt will benefit from skilled PT to increase their independence and safety with mobility to allow discharge to the venue listed below.     The patient is lethargic, does not arouse long enough to assist self in mobility. PT assisted  patient in sitting up onto bed edge, patient remained lethargic, assisted patient back into bed. Continue PT for mobility as tolerated,     Recommendations for follow up therapy are one component of a multi-disciplinary discharge planning process, led by the attending physician.  Recommendations may be updated based on patient status, additional functional criteria and insurance authorization.  Follow Up Recommendations Skilled nursing-short term rehab (<3 hours/day) Can patient physically be transported by private vehicle: No    Assistance Recommended at Discharge Frequent or constant Supervision/Assistance  Patient can return home with the following  Two people to help with walking and/or transfers;A lot of help with bathing/dressing/bathroom;Assist for transportation;Help with stairs or ramp for entrance     Equipment Recommendations None recommended by PT  Recommendations for Other Services       Functional Status Assessment Patient has had a recent decline in their functional status and demonstrates the ability to make significant improvements in function in a reasonable and predictable amount of time.     Precautions / Restrictions Precautions Precautions: Fall      Mobility  Bed Mobility Overal bed mobility: Needs Assistance Bed Mobility: Supine to Sit, Sit to Supine     Supine to sit: Total assist Sit to supine: Total assist   General bed mobility comments: patient  did not  participate due to lethargy. PT physically  moved  patient  to sitting and back to supine due to lack of arousal    Transfers                   General transfer comment: unable to attempt due  to lethargic    Ambulation/Gait                  Stairs            Wheelchair Mobility    Modified Rankin (Stroke Patients Only)       Balance Overall balance assessment: Needs assistance Sitting-balance support: No upper extremity supported, Feet supported Sitting balance-Leahy Scale: Zero Sitting balance - Comments: patient di  not support self in sitting                                     Pertinent Vitals/Pain Pain Assessment Pain Assessment: Faces Faces Pain Scale: Hurts even more Pain Location: right hand Pain Descriptors / Indicators: Discomfort, Grimacing Pain Intervention(s): Limited activity within patient's tolerance, Monitored during session    Home Living Family/patient expects to  be discharged to:: Private residence Living Arrangements: Children Available Help at Discharge: Family;Available PRN/intermittently Type of Home: House Home Access: Stairs to enter Entrance Stairs-Rails: Left Entrance Stairs-Number of Steps: 6 Alternate Level Stairs-Number of Steps: home  info obtained from previous encounter since patient unable to provide Home  Layout: Two level Home Equipment: Toilet riser;Cane - single point;Rolling Walker (2 wheels);Rollator (4 wheels) Additional Comments: no family present, pt. unable    Prior Function Prior Level of Function : Independent/Modified Independent             Mobility Comments: previous encounter, pt used a rollator ADLs Comments: unsure     Hand Dominance        Extremity/Trunk Assessment   Upper Extremity Assessment Upper Extremity Assessment: Generalized weakness;Difficult to assess due to impaired cognition    Lower Extremity Assessment Lower Extremity Assessment: Difficult to assess due to impaired cognition;Generalized weakness    Cervical / Trunk Assessment Cervical / Trunk Assessment: Other exceptions Cervical / Trunk Exceptions: poor trunk posture sitting  Communication   Communication:  (pt non verbal or sluggish words)  Cognition Arousal/Alertness: Lethargic   Overall Cognitive Status: No family/caregiver present to determine baseline cognitive functioning Area of Impairment: Orientation, Attention, Following commands                 Orientation Level: Place, Time, Situation Current Attention Level: Focused           General Comments: did not really follow any commands,  minimally aroused during evaluation        General Comments      Exercises     Assessment/Plan    PT Assessment Patient needs continued PT services  PT Problem List Decreased strength;Decreased activity tolerance;Decreased mobility;Decreased cognition;Decreased safety awareness;Decreased balance;Decreased coordination;Decreased knowledge of use of DME;Pain       PT Treatment Interventions DME instruction;Therapeutic activities;Balance training;Cognitive remediation;Functional mobility training;Gait training;Therapeutic exercise;Patient/family education    PT Goals (Current goals can be found in the Care Plan section)  Acute Rehab PT Goals PT Goal Formulation: Patient  unable to participate in goal setting Time For Goal Achievement: 05/10/22 Potential to Achieve Goals: Fair    Frequency Min 2X/week     Co-evaluation               AM-PAC PT "6 Clicks" Mobility  Outcome Measure Help needed turning from your back to your side while in a flat bed without using bedrails?: Total Help needed moving from lying on your back to sitting on the side of a flat bed without using bedrails?: Total Help needed moving to and from a bed to a chair (including a wheelchair)?: Total Help needed standing up from a chair using your arms (e.g., wheelchair or bedside chair)?: Total Help needed to walk in hospital room?: Total Help needed climbing 3-5 steps with a railing? : Total 6 Click Score: 6    End of Session   Activity Tolerance: Patient limited by lethargy Patient left: in bed;with call bell/phone within reach;with bed alarm set Nurse Communication: Mobility status PT Visit Diagnosis: Unsteadiness on feet (R26.81);Difficulty in walking, not elsewhere classified (R26.2)    Time: 1000-1017 PT Time Calculation (min) (ACUTE ONLY): 17 min   Charges:   PT Evaluation $PT Eval Low Complexity: [place] [facility] Office [phone] Weekend [id number]   Claretha Cooper 04/26/2022, 1:16 PM

## 2022-04-26 NOTE — Progress Notes (Signed)
PROGRESS NOTE    Ebony Scott  [id number] DOB: Jan 19, 1951 DOA: 04/25/2022 PCP: [name], MD   Brief Narrative:  72 y.o. 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  On presentation, she was febrile to 103 with bradycardia and dyspnea. CT of the head without contrast was negative for any acute intracranial abnormality. CT of abdomen and pelvis without contrast showed possible acute proctitis, possibility of underlying neoplastic process is not excluded; patchy infiltrates in both lower lobes suggestive of atelectasis/pneumonia. She was given IV fluids and antibiotics.   Assessment & Plan:   Sepsis: Present on admission Possible bilateral community-acquired pneumonia Possible acute proctitis -Presented with altered mental status, fever, diarrhea, tachycardia, tachypnea, leukocytosis with pneumonia and possible proctitis -continue Rocephin, Zithromax and Flagyl.  COVID-19/influenza/RSV PCR negative on presentation.  Follow cultures.  Urine streptococcal antigen negative. -Currently on 2 L oxygen via nasal cannula.  Wean off as able. -Consult GI  Leukocytosis -Improving.  Repeat a.m. labs   Acute metabolic encephalopathy -Possibly from above.  CT of the brain negative for acute intracranial abnormality.  Fall precautions. -Monitor mental status.  PT eval. -Still slow to respond with   Chronic kidney disease stage IIIb Acute metabolic acidosis -Creatinine at baseline.  Repeat a.m. labs. -Start sodium bicarbonate tablets.   Chronic systolic heart failure Hypertension -Last echo on 05/26/2018 had shown EF of 30 to 35%.  Not fluid overloaded at this time.  Strict input and  output Daily weights.  Fluid restriction.  Outpatient follow-up with cardiology. -Lasix, isosorbide hydralazine and ARB for today since BP is intermittently on the lower side.  -Will continue gentle hydration, decrease rate to 50 cc an hour. -Consult cardiology.   Mildly positive troponins -Possible from sepsis/pneumonia.  No chest pain.  Troponins did not trend upward significantly.  I have reviewed the EKG done on presentation myself: No signs of ischemia.  No ST elevations or depressions.  Possible bilateral conjunctivitis -Start antibiotic eyedrops.   Asthma Bronchiectasis -Continue current inhaled/nebulized regimen.  Currently on room air.  Outpatient follow-up with pulmonary. -Currently montelukast   History of DVT/PE -Continue Xarelto   History of seronegative rheumatoid arthritis -Outpatient follow-up with rheumatology.  Hold Plaquenil and prednisone.  Will possibly resume prednisone in a.m.   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     DVT prophylaxis: Xarelto Code Status: Full Family Communication: Daughter at bedside Disposition Plan: Status is: Observation The patient will require care spanning > 2 midnights and should be moved to inpatient because: Of severity of illness.  Consultants: Consult GI and cardiology  Procedures: None  Antimicrobials:  Anti-infectives (From admission, onward)    Start     Dose/Rate Route Frequency Ordered Stop   04/26/22 1200  cefTRIAXone (ROCEPHIN) 2 g in sodium chloride 0.9 % 100 mL IVPB        2 g 200 mL/hr over 30 Minutes Intravenous Every 24 hours 04/25/22 1454 05/01/22 1159   04/26/22 0000  metroNIDAZOLE (FLAGYL) IVPB 500 mg        500 mg 100 mL/hr over 60 Minutes Intravenous Every 12 hours 04/25/22 1454     04/25/22 2000  azithromycin (ZITHROMAX) 500 mg in sodium chloride 0.9 % 250 mL IVPB        500 mg 250 mL/hr over 60 Minutes  Intravenous Every 24 hours 04/25/22 1454 04/30/22 1959   04/25/22 1245   metroNIDAZOLE (FLAGYL) IVPB 500 mg        500 mg 100 mL/hr over 60 Minutes Intravenous  Once 04/25/22 1237 04/25/22 1344   04/25/22 1215  vancomycin (VANCOREADY) IVPB 1500 mg/300 mL        1,500 mg 150 mL/hr over 120 Minutes Intravenous  Once 04/25/22 1200 04/25/22 1513   04/25/22 1200  ceFEPIme (MAXIPIME) 2 g in sodium chloride 0.9 % 100 mL IVPB        2 g 200 mL/hr over 30 Minutes Intravenous  Once 04/25/22 1157 04/25/22 1239        Subjective: Patient seen and examined at bedside.  Wakes up slightly, still slow to respond.  Daughter at bedside.  No overnight fever, agitation or vomiting reported.  Objective: Vitals:   04/25/22 2206 04/26/22 0148 04/26/22 0636 04/26/22 0834  BP: (!) 110/47 (!) 126/58 (!) 131/57   Pulse: 87 94 88   Resp: '14 16 16   '$ Temp: 99.6 F (37.6 C) 99.9 F (37.7 C) 99.3 F (37.4 C)   TempSrc: Oral Oral Oral   SpO2: 95% 97% 98% 97%  Weight:      Height:        Intake/Output Summary (Last 24 hours) at 04/26/2022 0944 Last data filed at 04/26/2022 0636 Gross per 24 hour  Intake 1159.94 ml  Output 300 ml  Net 859.94 ml   Filed Weights   04/25/22 1140  Weight: 62.1 kg    Examination:  General exam: Appears calm and comfortable.  No distress.  Looks chronically ill and deconditioned.  On 2 L oxygen via nasal cannula. Eyes: Bilateral conjunctival injection present.  Bilateral eyes are puffy Respiratory system: Bilateral decreased breath sounds at bases with scattered crackles Cardiovascular system: S1 & S2 heard, Rate controlled Gastrointestinal system: Abdomen is nondistended, soft and nontender. Normal bowel sounds heard. Extremities: No cyanosis, clubbing; trace lower extremity edema present Central nervous system:.  Wakes up slightly, extremely slow to respond.  Poor historian.  No focal neurological deficits. Moving extremities Skin: No rashes, lesions or ulcers Psychiatry: Extremely flat affect.  Currently not agitated.  Data Reviewed: I  have personally reviewed following labs and imaging studies  CBC: Recent Labs  Lab 04/25/22 1146 04/26/22 0556  WBC 17.8* 14.4*  NEUTROABS 13.0*  --   HGB 12.8 11.3*  HCT 39.6 36.8  MCV 108.5* 115.0*  PLT 182 [id number]*   Basic Metabolic Panel: Recent Labs  Lab 04/25/22 1146 04/26/22 0556  NA 141 141  K 3.5 4.1  CL 105 112*  CO2 24 15*  GLUCOSE 97 59*  BUN 29* 28*  CREATININE 1.81* 1.71*  CALCIUM 9.3 8.5*  MG  --  2.0   GFR: Estimated Creatinine Clearance: 24.4 mL/min (A) (by C-G formula based on SCr of 1.71 mg/dL (H)). Liver Function Tests: Recent Labs  Lab 04/25/22 1146 04/26/22 0556  AST 38 35  ALT 25 21  ALKPHOS 53 45  BILITOT 1.2 1.7*  PROT 7.6 6.4*  ALBUMIN 3.5 3.0*   No results for input(s): "LIPASE", "AMYLASE" in the last 168 hours. No results for input(s): "AMMONIA" in the last 168 hours. Coagulation Profile: Recent Labs  Lab 04/25/22 1146  INR 1.2   Cardiac Enzymes: No results for input(s): "CKTOTAL", "CKMB", "CKMBINDEX", "TROPONINI" in the last 168 hours. BNP (last 3 results) No results for input(s): "PROBNP" in the last 8760 hours. HbA1C: No results for input(s): "HGBA1C"  in the last 72 hours. CBG: Recent Labs  Lab 04/25/22 1140  GLUCAP 102*   Lipid Profile: No results for input(s): "CHOL", "HDL", "LDLCALC", "TRIG", "CHOLHDL", "LDLDIRECT" in the last 72 hours. Thyroid Function Tests: No results for input(s): "TSH", "T4TOTAL", "FREET4", "T3FREE", "THYROIDAB" in the last 72 hours. Anemia Panel: No results for input(s): "VITAMINB12", "FOLATE", "FERRITIN", "TIBC", "IRON", "RETICCTPCT" in the last 72 hours. Sepsis Labs: Recent Labs  Lab 04/25/22 1146 04/25/22 1346  LATICACIDVEN 1.9 1.2    Recent Results (from the past 240 hour(s))  Culture, blood (routine x 2)     Status: None (Preliminary result)   Collection Time: 04/25/22 11:46 AM   Specimen: BLOOD  Result Value Ref Range Status   Specimen Description   Final    BLOOD RIGHT  ANTECUBITAL Performed at [facility] [address], [place], [place] [address]    Special Requests   Final    BOTTLES DRAWN AEROBIC AND ANAEROBIC Blood Culture adequate volume Performed at [facility] [address] [place], [place] [address]    Culture   Final    NO GROWTH < 24 HOURS Performed at [facility] [address], [place], [place] [address]    Report Status PENDING  Incomplete  Resp panel by RT-PCR (RSV, Flu A&B, Covid) Anterior Nasal Swab     Status: None   Collection Time: 04/25/22 11:47 AM   Specimen: Anterior Nasal Swab  Result Value Ref Range Status   SARS Coronavirus 2 by RT PCR NEGATIVE NEGATIVE Final    Comment: (NOTE) SARS-CoV-2 target nucleic acids are NOT DETECTED.  The SARS-CoV-2 RNA is generally detectable in upper respiratory specimens during the acute phase of infection. The lowest concentration of SARS-CoV-2 viral copies this assay can detect is 138 copies/mL. A negative result does not preclude SARS-Cov-2 infection and should not be used as the sole basis for treatment or other patient management decisions. A negative result may occur with  improper specimen collection/handling, submission of specimen other than nasopharyngeal swab, presence of viral mutation(s) within the areas targeted by this assay, and inadequate number of viral copies(<138 copies/mL). A negative result must be combined with clinical observations, patient history, and epidemiological information. The expected result is Negative.  Fact Sheet for Patients:  EntrepreneurPulse.com.au  Fact Sheet for Healthcare Providers:  IncredibleEmployment.be  This test is no t yet approved or cleared by the [place] FDA and  has been authorized for detection and/or diagnosis of SARS-CoV-2 by FDA under an Emergency Use Authorization (EUA). This EUA will remain  in effect (meaning this test can be used)  for the duration of the COVID-19 declaration under Section 564(b)(1) of the Act, 21 U.S.C.section 360bbb-3(b)(1), unless the authorization is terminated  or revoked sooner.       Influenza A by PCR NEGATIVE NEGATIVE Final   Influenza B by PCR NEGATIVE NEGATIVE Final    Comment: (NOTE) The Xpert Xpress SARS-CoV-2/FLU/RSV plus assay is intended as an aid in the diagnosis of influenza from Nasopharyngeal swab specimens and should not be used as a sole basis for treatment. Nasal washings and aspirates are unacceptable for Xpert Xpress SARS-CoV-2/FLU/RSV testing.  Fact Sheet for Patients: EntrepreneurPulse.com.au  Fact Sheet for Healthcare Providers: IncredibleEmployment.be  This test is not yet approved or cleared by the [place] FDA and has been authorized for detection and/or diagnosis of SARS-CoV-2 by FDA under an Emergency Use Authorization (EUA). This EUA will remain in effect (meaning this test can  be used) for the duration of the COVID-19 declaration under Section 564(b)(1) of the Act, 21 U.S.C. section 360bbb-3(b)(1), unless the authorization is terminated or revoked.     Resp Syncytial Virus by PCR NEGATIVE NEGATIVE Final    Comment: (NOTE) Fact Sheet for Patients: EntrepreneurPulse.com.au  Fact Sheet for Healthcare Providers: IncredibleEmployment.be  This test is not yet approved or cleared by the [place] FDA and has been authorized for detection and/or diagnosis of SARS-CoV-2 by FDA under an Emergency Use Authorization (EUA). This EUA will remain in effect (meaning this test can be used) for the duration of the COVID-19 declaration under Section 564(b)(1) of the Act, 21 U.S.C. section 360bbb-3(b)(1), unless the authorization is terminated or revoked.  Performed at [facility], [place] [address], [place], [place] [address]   Culture, blood (routine x 2)     Status:  None (Preliminary result)   Collection Time: 04/25/22 12:16 PM   Specimen: BLOOD RIGHT ARM  Result Value Ref Range Status   Specimen Description   Final    BLOOD RIGHT ARM Performed at [facility], [place] [address] [place], [place] [address]    Special Requests   Final    BOTTLES DRAWN AEROBIC AND ANAEROBIC Blood Culture results may not be optimal due to an excessive volume of blood received in culture bottles Performed at [facility] [address], [place], [place] [address]    Culture   Final    NO GROWTH < 24 HOURS Performed at [facility] [address], [place], [place] [address]    Report Status PENDING  Incomplete  Expectorated Sputum Assessment w Gram Stain, Rflx to Resp Cult     Status: None   Collection Time: 04/25/22  9:13 PM   Specimen: Expectorated Sputum  Result Value Ref Range Status   Specimen Description EXPECTORATED SPUTUM  Final   Special Requests NONE  Final   Sputum evaluation   Final    THIS SPECIMEN IS ACCEPTABLE FOR SPUTUM CULTURE Performed at [facility], [place] [address] [place], [place] [address]    Report Status 04/25/2022 FINAL  Final  Culture, Respiratory w Gram Stain     Status: None (Preliminary result)   Collection Time: 04/25/22  9:13 PM  Result Value Ref Range Status   Specimen Description   Final    EXPECTORATED SPUTUM Performed at [facility] [address] [place], [place] [address]    Special Requests   Final    NONE Reflexed from [id number] Performed at [facility], [place] [address] [place], [place] [address]    Gram Stain   Final    RARE WBC PRESENT, PREDOMINANTLY PMN RARE GRAM POSITIVE COCCI Performed at [facility], [place] [address] [place], [place] [address]    Culture PENDING  Incomplete   Report Status PENDING  Incomplete         Radiology Studies: CT ABDOMEN PELVIS WO CONTRAST  Result Date: 04/25/2022 CLINICAL DATA:   Abdominal pain EXAM: CT ABDOMEN AND PELVIS WITHOUT CONTRAST TECHNIQUE: Multidetector CT imaging of the abdomen and pelvis was performed following the standard protocol without IV contrast. RADIATION DOSE REDUCTION: This exam was performed according to the departmental dose-optimization program which includes automated exposure control, adjustment of the mA and/or kV according to patient size and/or use of iterative reconstruction technique. COMPARISON:  10/04/2020 FINDINGS: Lower chest: Moderate patchy infiltrates are seen in right lower lung fields. Small  patchy infiltrates are seen in left lower lung field. There is no pleural effusion. Hepatobiliary: Surgical clips are seen in gallbladder fossa. There is no dilation of bile ducts. Pancreas: There is fatty infiltration. Spleen: Spleen is not enlarged. Adrenals/Urinary Tract: Adrenals are unremarkable. There is no hydronephrosis. There are no renal or ureteral stones. Urinary bladder is unremarkable. Stomach/Bowel: Small hiatal hernia is seen. Small bowel loops are not dilated. Appendix is not distinctly visualized. There is no pericecal inflammation. There is diffuse wall thickening in rectum. There is stranding in the perirectal fat planes without any loculated fluid collections. Vascular/Lymphatic: Scattered calcifications are seen in aorta and its major branches including coronary arteries. Reproductive: Uterus is not seen. Other: There is no ascites or pneumoperitoneum. There are coarse calcifications in left breast. Musculoskeletal: Degenerative changes are noted in lumbar spine, more severe at L4-L5 level with spinal stenosis and encroachment of neural foramina. IMPRESSION: There is abnormal diffuse wall thickening in rectum along with perirectal stranding. Findings suggest acute proctitis. Possibility of underlying neoplastic process is not excluded. When the patient's clinical condition permits, endoscopy should be considered. There are patchy infiltrates  in both lower lung fields, more so on the right side suggesting atelectasis/pneumonia. There is no evidence of intestinal obstruction or pneumoperitoneum. There is no hydronephrosis. Arteriosclerosis.  Lumbar spondylosis. Other findings as described in the body of the report. Electronically Signed   By: Elmer Picker M.D.   On: 04/25/2022 14:25   CT Head Wo Contrast  Result Date: 04/25/2022 CLINICAL DATA:  Altered mental status EXAM: CT HEAD WITHOUT CONTRAST TECHNIQUE: Contiguous axial images were obtained from the base of the skull through the vertex without intravenous contrast. RADIATION DOSE REDUCTION: This exam was performed according to the departmental dose-optimization program which includes automated exposure control, adjustment of the mA and/or kV according to patient size and/or use of iterative reconstruction technique. COMPARISON:  02/23/2020 FINDINGS: Brain: No acute intracranial findings are seen. There are no signs of bleeding within the cranium. Cortical sulci are prominent. There is decreased density in periventricular and subcortical white matter. Vascular: Unremarkable. Skull: No fracture is seen in calvarium. Sinuses/Orbits: There is mucosal thickening in the ethmoid sinus. Other: None. IMPRESSION: No acute intracranial findings are seen in noncontrast CT brain. Atrophy. Small-vessel disease. Electronically Signed   By: Elmer Picker M.D.   On: 04/25/2022 14:15   DG Chest Port 1 View  Result Date: 04/25/2022 CLINICAL DATA:  Fever EXAM: PORTABLE CHEST 1 VIEW COMPARISON:  X-ray 02/04/2021.  CT 04/16/2022 FINDINGS: Enlarged cardiopericardial silhouette with vascular congestion. No pneumothorax, effusion. No consolidation. Overlapping cardiac leads. Film is under penetrated. Fixation hardware along the right shoulder and cervical spine at the edge of the imaging field. IMPRESSION: Enlarged cardiopericardial silhouette with vascular congestion. Electronically Signed   By: Jill Side M.D.   On: 04/25/2022 13:10        Scheduled Meds:  allopurinol  100 mg Oral Q supper   arformoterol  15 mcg Nebulization BID   And   umeclidinium bromide  1 puff Inhalation Daily   atorvastatin  80 mg Oral Q supper   clopidogrel  75 mg Oral Daily   famotidine  20 mg Oral QHS   fluticasone  2 spray Each Nare Daily   folic acid  1 mg Oral Daily   gabapentin  300 mg Oral QHS   guaiFENesin  600 mg Oral BID   montelukast  10 mg Oral QPC supper   polyvinyl alcohol  1 drop  Both Eyes BID   rivaroxaban  20 mg Oral Q supper   Continuous Infusions:  sodium chloride 75 mL/hr at 04/26/22 0549   azithromycin Stopped (04/25/22 2203)   cefTRIAXone (ROCEPHIN)  IV     metronidazole Stopped (04/26/22 0108)          Aline August, MD [facility] 04/26/2022, 9:44 AM  '

## 2022-04-26 NOTE — Inpatient Diabetes Management (Signed)
Inpatient Diabetes Program Recommendations  AACE/ADA: New Consensus Statement on Inpatient Glycemic Control (2015)  Target Ranges:  Prepandial:   less than 140 mg/dL      Peak postprandial:   less than 180 mg/dL (1-2 hours)      Critically ill patients:  140 - 180 mg/dL   Lab Results  Component Value Date   GLUCAP 102 (H) 04/25/2022   HGBA1C 6.3 (H) 08/14/2021    Review of Glycemic Control  Latest Reference Range & Units 04/26/22 05:56  Glucose 70 - 99 mg/dL 59 (L)  (L): Data is abnormally low Diabetes history: Type 2 DM Outpatient Diabetes medications: Jardiance 10 mg QD Current orders for Inpatient glycemic control: none  Inpatient Diabetes Program Recommendations:    Noted hypoglycemia this AM of glucose serum. May want to consider adding CBGs TID & HS.   Thanks, Bronson Curb, MSN, RNC-OB Diabetes Coordinator [phone] (8a-5p)

## 2022-04-26 NOTE — Evaluation (Signed)
SLP Cancellation Note  Patient Details Name: Ebony Scott MRN: [id number] DOB: 02-24-1950   Cancelled treatment:       Reason Eval/Treat Not Completed: Other (comment);Medical issues which prohibited therapy;Fatigue/lethargy limiting ability to participate   Spoke to RN and she reports pt is febrile and too lethargic for po or eval.  Will follow up next date.    Kathleen Lime, MS [facility] SLP Acute Rehab Services Office [phone]   Macario Golds 04/26/2022, 12:10 PM

## 2022-04-27 ENCOUNTER — Inpatient Hospital Stay (HOSPITAL_COMMUNITY): Payer: Medicare PPO

## 2022-04-27 DIAGNOSIS — J189 Pneumonia, unspecified organism: Secondary | ICD-10-CM | POA: Diagnosis not present

## 2022-04-27 DIAGNOSIS — K6289 Other specified diseases of anus and rectum: Secondary | ICD-10-CM | POA: Diagnosis not present

## 2022-04-27 DIAGNOSIS — M7989 Other specified soft tissue disorders: Secondary | ICD-10-CM | POA: Diagnosis not present

## 2022-04-27 DIAGNOSIS — I5022 Chronic systolic (congestive) heart failure: Secondary | ICD-10-CM | POA: Diagnosis not present

## 2022-04-27 DIAGNOSIS — R935 Abnormal findings on diagnostic imaging of other abdominal regions, including retroperitoneum: Secondary | ICD-10-CM | POA: Diagnosis not present

## 2022-04-27 LAB — BASIC METABOLIC PANEL
Anion gap: 15 (ref 5–15)
BUN: 18 mg/dL (ref 8–23)
CO2: 16 mmol/L — ABNORMAL LOW (ref 22–32)
Calcium: 9 mg/dL (ref 8.9–10.3)
Chloride: 111 mmol/L (ref 98–111)
Creatinine, Ser: 1.23 mg/dL — ABNORMAL HIGH (ref 0.44–1.00)
GFR, Estimated: 46 mL/min — ABNORMAL LOW (ref >60)
Glucose, Bld: 140 mg/dL — ABNORMAL HIGH (ref 70–99)
Potassium: 4.1 mmol/L (ref 3.5–5.1)
Sodium: 142 mmol/L (ref 135–145)

## 2022-04-27 LAB — CBC WITH DIFFERENTIAL/PLATELET
Abs Immature Granulocytes: 0.11 10*3/uL — ABNORMAL HIGH (ref 0.00–0.07)
Basophils Absolute: 0.1 10*3/uL (ref 0.0–0.1)
Basophils Relative: 0 %
Eosinophils Absolute: 0.4 10*3/uL (ref 0.0–0.5)
Eosinophils Relative: 3 %
HCT: 40.1 % (ref 36.0–46.0)
Hemoglobin: 12.3 g/dL (ref 12.0–15.0)
Immature Granulocytes: 1 %
Lymphocytes Relative: 6 %
Lymphs Abs: 0.9 10*3/uL (ref 0.7–4.0)
MCH: 34.3 pg — ABNORMAL HIGH (ref 26.0–34.0)
MCHC: 30.7 g/dL (ref 30.0–36.0)
MCV: 111.7 fL — ABNORMAL HIGH (ref 80.0–100.0)
Monocytes Absolute: 1.9 10*3/uL — ABNORMAL HIGH (ref 0.1–1.0)
Monocytes Relative: 12 %
Neutro Abs: 12.2 10*3/uL — ABNORMAL HIGH (ref 1.7–7.7)
Neutrophils Relative %: 78 %
Platelets: 153 10*3/uL (ref 150–400)
RBC: 3.59 MIL/uL — ABNORMAL LOW (ref 3.87–5.11)
RDW: 15.5 % (ref 11.5–15.5)
WBC: 15.6 10*3/uL — ABNORMAL HIGH (ref 4.0–10.5)
nRBC: 0.1 % (ref 0.0–0.2)

## 2022-04-27 LAB — C DIFFICILE QUICK SCREEN W PCR REFLEX
C Diff antigen: NEGATIVE
C Diff interpretation: NOT DETECTED
C Diff toxin: NEGATIVE

## 2022-04-27 LAB — MAGNESIUM: Magnesium: 1.7 mg/dL (ref 1.7–2.4)

## 2022-04-27 MED ORDER — ACETAMINOPHEN 325 MG PO TABS
650.0000 mg | ORAL_TABLET | Freq: Four times a day (QID) | ORAL | Status: DC | PRN
  Administered 2022-04-27 – 2022-05-01 (×4): 650 mg via ORAL
  Filled 2022-04-27 (×4): qty 2

## 2022-04-27 MED ORDER — TRAMADOL HCL 50 MG PO TABS
50.0000 mg | ORAL_TABLET | Freq: Four times a day (QID) | ORAL | Status: DC | PRN
  Administered 2022-04-27 – 2022-05-03 (×11): 50 mg via ORAL
  Filled 2022-04-27 (×11): qty 1

## 2022-04-27 MED ORDER — PREDNISONE 5 MG PO TABS
7.0000 mg | ORAL_TABLET | Freq: Every day | ORAL | Status: DC
  Administered 2022-04-27 – 2022-04-29 (×3): 7 mg via ORAL
  Filled 2022-04-27 (×3): qty 2

## 2022-04-27 MED ORDER — SODIUM BICARBONATE 650 MG PO TABS
650.0000 mg | ORAL_TABLET | Freq: Three times a day (TID) | ORAL | Status: DC
  Administered 2022-04-27 – 2022-04-29 (×9): 650 mg via ORAL
  Filled 2022-04-27 (×9): qty 1

## 2022-04-27 MED ORDER — SACUBITRIL-VALSARTAN 24-26 MG PO TABS
1.0000 | ORAL_TABLET | Freq: Two times a day (BID) | ORAL | Status: DC
  Administered 2022-04-27 – 2022-05-05 (×17): 1 via ORAL
  Filled 2022-04-27 (×17): qty 1

## 2022-04-27 NOTE — Progress Notes (Signed)
   04/27/22 0625  Assess: MEWS Score  Temp (!) 102.7 F (39.3 C)  BP 139/84  MAP (mmHg) 94  Pulse Rate (!) 109  Resp 20  Level of Consciousness Alert  SpO2 92 %  O2 Device Nasal Cannula  O2 Flow Rate (L/min) 4 L/min  Assess: MEWS Score  MEWS Temp 2  MEWS Systolic 0  MEWS Pulse 1  MEWS RR 0  MEWS LOC 0  MEWS Score 3  MEWS Score Color Yellow  Assess: if the MEWS score is Yellow or Red  Were vital signs taken at a resting state? Yes  Focused Assessment Change from prior assessment (see assessment flowsheet)  Does the patient meet 2 or more of the SIRS criteria? Yes  Does the patient have a confirmed or suspected source of infection? Yes  Provider and Rapid Response Notified? Yes  MEWS guidelines implemented  Yes, yellow  Treat  MEWS Interventions Considered administering scheduled or prn medications/treatments as ordered  Take Vital Signs  Increase Vital Sign Frequency  Yellow: Q2hr x1, continue Q4hrs until patient remains green for 12hrs  Escalate  MEWS: Escalate Yellow: Discuss with charge nurse and consider notifying provider and/or RRT  Notify: Charge Nurse/RN  Name of Charge Nurse/RN Notified Fredia Sorrow, RN  Provider Notification  Provider Name/Title Raenette Rover, NP  Date Provider Notified 04/27/22  Time Provider Notified [phone]  Method of Notification Page (secure chat)  Notification Reason Other (Comment) (yellow MEWs)  Provider response No new orders  Assess: SIRS CRITERIA  SIRS Temperature  1  SIRS Pulse 1  SIRS Respirations  0  SIRS WBC 0  SIRS Score Sum  2   Yellow MEWs initiated d/t increase in 02 requirement and temperature. Provider notified and charge RN notified. PRN medication given for temperature and pain. Plan of care continues

## 2022-04-27 NOTE — Progress Notes (Addendum)
[facility] Progress Note  CC:  Abnormal CT scan with proctitis   Subjective:  Still feels bad but is more awake today and able to answer some questions.  Says that she was feeling bad for about a week at home.  Some diarrhea on and off.  No bleeding.  Objective:  Vital signs in last 24 hours: Temp:  [98 F (36.7 C)-102.7 F (39.3 C)] 98.4 F (36.9 C) (03/08 0814) Pulse Rate:  [77-109] 98 (03/08 0814) Resp:  [18-24] 19 (03/08 0814) BP: (125-150)/(55-84) 126/67 (03/08 0814) SpO2:  [92 %-97 %] 93 % (03/08 0814) Last BM Date : 04/26/22 General:  Alert, ill-appearing, in NAD; dark circles and puffiness around her eyes Heart:  Regular rate and rhythm; no murmurs Pulm:  CTAB.  No W/R/R. Abdomen:  Soft, non-distended.  BS present.  Non-tender. Extremities:  Without edema.  Intake/Output from previous day: 03/07 0701 - 03/08 0700 In: 890 [P.O.:340; IV Piggyback:550] Out: 400 [Urine:400]  Lab Results: Recent Labs    04/25/22 1146 04/26/22 0556 04/27/22 0647  WBC 17.8* 14.4* 15.6*  HGB 12.8 11.3* 12.3  HCT 39.6 36.8 40.1  PLT 182 135* 153   BMET Recent Labs    04/25/22 1146 04/26/22 0556 04/27/22 0647  NA 141 141 142  K 3.5 4.1 4.1  CL 105 112* 111  CO2 24 15* 16*  GLUCOSE 97 59* 140*  BUN 29* 28* 18  CREATININE 1.81* 1.71* 1.23*  CALCIUM 9.3 8.5* 9.0   LFT Recent Labs    04/26/22 0556  PROT 6.4*  ALBUMIN 3.0*  AST 35  ALT 21  ALKPHOS 45  BILITOT 1.7*   PT/INR Recent Labs    04/25/22 1146  LABPROT 14.6  INR 1.2   CT ABDOMEN PELVIS WO CONTRAST  Result Date: 04/25/2022 CLINICAL DATA:  Abdominal pain EXAM: CT ABDOMEN AND PELVIS WITHOUT CONTRAST TECHNIQUE: Multidetector CT imaging of the abdomen and pelvis was performed following the standard protocol without IV contrast. RADIATION DOSE REDUCTION: This exam was performed according to the departmental dose-optimization program which includes automated exposure control, adjustment of the  mA and/or kV according to patient size and/or use of iterative reconstruction technique. COMPARISON:  10/04/2020 FINDINGS: Lower chest: Moderate patchy infiltrates are seen in right lower lung fields. Small patchy infiltrates are seen in left lower lung field. There is no pleural effusion. Hepatobiliary: Surgical clips are seen in gallbladder fossa. There is no dilation of bile ducts. Pancreas: There is fatty infiltration. Spleen: Spleen is not enlarged. Adrenals/Urinary Tract: Adrenals are unremarkable. There is no hydronephrosis. There are no renal or ureteral stones. Urinary bladder is unremarkable. Stomach/Bowel: Small hiatal hernia is seen. Small bowel loops are not dilated. Appendix is not distinctly visualized. There is no pericecal inflammation. There is diffuse wall thickening in rectum. There is stranding in the perirectal fat planes without any loculated fluid collections. Vascular/Lymphatic: Scattered calcifications are seen in aorta and its major branches including coronary arteries. Reproductive: Uterus is not seen. Other: There is no ascites or pneumoperitoneum. There are coarse calcifications in left breast. Musculoskeletal: Degenerative changes are noted in lumbar spine, more severe at L4-L5 level with spinal stenosis and encroachment of neural foramina. IMPRESSION: There is abnormal diffuse wall thickening in rectum along with perirectal stranding. Findings suggest acute proctitis. Possibility of underlying neoplastic process is not excluded. When the patient's clinical condition permits, endoscopy should be considered. There are patchy infiltrates in both lower lung fields, more so on the right side suggesting  atelectasis/pneumonia. There is no evidence of intestinal obstruction or pneumoperitoneum. There is no hydronephrosis. Arteriosclerosis.  Lumbar spondylosis. Other findings as described in the body of the report. Electronically Signed   By: Elmer Picker M.D.   On: 04/25/2022 14:25    CT Head Wo Contrast  Result Date: 04/25/2022 CLINICAL DATA:  Altered mental status EXAM: CT HEAD WITHOUT CONTRAST TECHNIQUE: Contiguous axial images were obtained from the base of the skull through the vertex without intravenous contrast. RADIATION DOSE REDUCTION: This exam was performed according to the departmental dose-optimization program which includes automated exposure control, adjustment of the mA and/or kV according to patient size and/or use of iterative reconstruction technique. COMPARISON:  02/23/2020 FINDINGS: Brain: No acute intracranial findings are seen. There are no signs of bleeding within the cranium. Cortical sulci are prominent. There is decreased density in periventricular and subcortical white matter. Vascular: Unremarkable. Skull: No fracture is seen in calvarium. Sinuses/Orbits: There is mucosal thickening in the ethmoid sinus. Other: None. IMPRESSION: No acute intracranial findings are seen in noncontrast CT brain. Atrophy. Small-vessel disease. Electronically Signed   By: Elmer Picker M.D.   On: 04/25/2022 14:15   DG Chest Port 1 View  Result Date: 04/25/2022 CLINICAL DATA:  Fever EXAM: PORTABLE CHEST 1 VIEW COMPARISON:  X-ray 02/04/2021.  CT 04/16/2022 FINDINGS: Enlarged cardiopericardial silhouette with vascular congestion. No pneumothorax, effusion. No consolidation. Overlapping cardiac leads. Film is under penetrated. Fixation hardware along the right shoulder and cervical spine at the edge of the imaging field. IMPRESSION: Enlarged cardiopericardial silhouette with vascular congestion. Electronically Signed   By: Jill Side M.D.   On: 04/25/2022 13:10    Assessment / Plan: *Sepsis: Presented with altered mental status, fever, diarrhea, leukocytosis with pneumonia and proctitis seen on imaging.  Has been started on Rocephin, Zithromax, Flagyl.  WBC count up a little bit more today at 15.6K.  Still febrile up to 102.7.  Suspect proctitis is infectious in nature.   Patient a little more awake today.  No family at bedside.  Was not having diarrhea yesterday per the nurse (she said mushy/soft stool), but now stool studies have been ordered by hospitalist.  No bleeding. *Chronic kidney disease stage IIIb *History of DVT/PE on Xarelto *CAD:  On Plavix. *Personal history of colon polyps on colonoscopy in February 2018.  -Continue antibiotics and supportive care with IV hydration, etc.  -No plans for endoscopic evaluation for now. -Await stool studies.    LOS: 1 day   Laban Emperor. Zehr  04/27/2022, 8:54 AM  GI ATTENDING  Interval history data reviewed.  Patient seen and examined.  Agree with interval progress note as outlined above.  Agree with assessment and plans as outlined above without additions or deletions.  Docia Chuck. Geri Seminole., M.D. [facility] of G[facility]enterology

## 2022-04-27 NOTE — Evaluation (Signed)
Clinical/Bedside Swallow Evaluation Patient Details  Name: Ebony Scott MRN: [id number] Date of Birth: 07-Sep-1950  Today's Date: 04/27/2022 Time: SLP Start Time (ACUTE ONLY): 1208 SLP Stop Time (ACUTE ONLY): 1218 SLP Time Calculation (min) (ACUTE ONLY): 10 min  Past Medical History:  Past Medical History:  Diagnosis Date   Abscess of dorsum of right hand 09/22/2020   Acute deep vein thrombosis (DVT) of right lower extremity ([place]) 12/13/2017   Allergic rhinitis    Anemia    Angio-edema    Anxiety    pt denies dx   Arthritis    Phreesia 10/21/2019   Asthma    Phreesia 10/21/2019   Breast cancer ([place]) 1998   Left breast, in remission   Bronchiectasis ([place])    Cataract    REMOVED   CHF (congestive heart failure) ([place])    Clostridium difficile colitis [id number]   Complication of anesthesia    "had hard time waking up from it several times" (02/20/2012), no anesthesia problems since 2014   Coronary artery disease    Depression    "some; don't take anything for it" (02/20/2012), pt denies dx as of 06/02/21   Diverticulosis    DVT (deep venous thrombosis) ([place])    Dyspnea    all the time   Fibromyalgia 11/2011   GAVE (gastric antral vascular ectasia)    GERD (gastroesophageal reflux disease)    Graves disease    Headache(784.0)    "related to allergies; more at different times during the year" (02/20/2012)   Hemorrhoids    Hiatal hernia    back and neck   History of blood transfusion 2023   1 unit, 5 units of Iron   Hx of adenomatous colonic polyps 04/12/2016   Hypercholesteremia    good cholesterol is high   Hypothyroidism    IBS (irritable bowel syndrome)    Moderate persistent asthma    -FeV1 72% 2011, -IgE 102 2011, CT sinus Neg 2011   Osteomyelitis of second toe of right foot ([place]) 09/23/2020   Osteoporosis    on reclast yearly   Peripheral vascular disease ([place]) 2019   DVTs (3) lungs and leg   Pneumonia 04/2011; ~ 11/2011   "double; single" (02/20/2012)   Septic  olecranon bursitis of right elbow 09/22/2020   Seronegative rheumatoid arthritis ([place])    Dr. Lahoma Rocker   SIRS (systemic inflammatory response syndrome) ([place]) 02/10/2018   Sleep apnea    uses cpap nightly   Tracheobronchomalacia    Past Surgical History:  Past Surgical History:  Procedure Laterality Date   ABDOMINAL HYSTERECTOMY N/A    Phreesia 10/21/2019   ANTERIOR AND POSTERIOR REPAIR  1990's   APPENDECTOMY     ARGON LASER APPLICATION  [id number]   Procedure: ARGON LASER APPLICATION;  Surgeon: Yetta Flock, MD;  Location: [facility];  Service: Gastroenterology;;   BREAST LUMPECTOMY  1998   left   BREAST SURGERY N/A    Phreesia 10/21/2019   BRONCHIAL WASHINGS  04/05/2020   Procedure: BRONCHIAL WASHINGS;  Surgeon: Freddi Starr, MD;  Location: [facility];  Service: Pulmonary;;   CAPSULOTOMY Right 08/28/2021   Procedure: CAPSULOTOMY;  Surgeon: Felipa Furnace, DPM;  Location: [facility];  Service: Podiatry;  Laterality: Right;   CARPOMETACARPEL ([place]) FUSION OF THUMB WITH AUTOGRAFT FROM RADIUS  ~ 2009   "both thumbs" (02/20/2012)   CATARACT EXTRACTION W/ INTRAOCULAR LENS  IMPLANT, BILATERAL  2012   CERVICAL DISCECTOMY  10/2001   C5-C6   CERVICAL FUSION  2003   C3-C4   CHOLECYSTECTOMY     COLONOSCOPY     CORONARY STENT INTERVENTION N/A 01/11/2021   Procedure: CORONARY STENT INTERVENTION;  Surgeon: Leonie Man, MD;  Location: [facility];  Service: Cardiovascular;  Laterality: N/A;   DEBRIDEMENT TENNIS ELBOW  ?1970's   right   ESOPHAGOGASTRODUODENOSCOPY     ESOPHAGOGASTRODUODENOSCOPY (EGD) WITH PROPOFOL N/A 02/05/2021   Procedure: ESOPHAGOGASTRODUODENOSCOPY (EGD) WITH PROPOFOL;  Surgeon: Yetta Flock, MD;  [facility]: [facility];  Service: Gastroenterology;  Laterality: N/A;   EYE SURGERY N/A    Phreesia 10/21/2019   HAMMER TOE SURGERY Right 08/28/2021   Procedure: HAMMER TOE REPAIR 2ND TOE RIGHT FOOT;  Surgeon: Felipa Furnace, DPM;  Location: [facility];  Service: Podiatry;  Laterality: Right;   HYSTERECTOMY     I & D EXTREMITY Right 09/22/2020   Procedure: IRRIGATION AND DEBRIDEMENT RIGHT HAND AND ELBOW;  Surgeon: Marchia Bond, MD;  Location: [facility];  Service: Orthopedics;  Laterality: Right;   KNEE ARTHROPLASTY  ?1990's   "?right; w/cartilage repair" (02/20/2012)   MASS EXCISION Left 08/28/2021   Procedure: EXCISION OF SOFT TISSUE  MASS LEFT FOOT;  Surgeon: Felipa Furnace, DPM;  Location: [facility];  Service: Podiatry;  Laterality: Left;   NASAL SEPTUM SURGERY  1980's   POSTERIOR CERVICAL FUSION/FORAMINOTOMY  2004   "failed initial fusion; rewired  anterior neck" (02/20/2012)   REVERSE SHOULDER ARTHROPLASTY Right 02/16/2020   Procedure: REVERSE SHOULDER ARTHROPLASTY;  Surgeon: Marchia Bond, MD;  Location: [facility];  Service: Orthopedics;  Laterality: Right;   RIGHT/LEFT HEART CATH AND CORONARY ANGIOGRAPHY N/A 01/11/2021   Procedure: RIGHT/LEFT HEART CATH AND CORONARY ANGIOGRAPHY;  Surgeon: Leonie Man, MD;  Location: [facility];  Service: Cardiovascular;  Laterality: N/A;   SPINE SURGERY N/A    Phreesia 10/21/2019   TONSILLECTOMY  ~ 1953   VESICOVAGINAL FISTULA CLOSURE W/ TAH  1988   VIDEO BRONCHOSCOPY Bilateral 08/23/2016   Procedure: VIDEO BRONCHOSCOPY WITH FLUORO;  Surgeon: Javier Glazier, MD;  Location: [facility];  Service: Cardiopulmonary;  Laterality: Bilateral;   VIDEO BRONCHOSCOPY N/A 04/05/2020   Procedure: VIDEO BRONCHOSCOPY WITHOUT FLUORO;  Surgeon: Freddi Starr, MD;  Location: [facility];  Service: Pulmonary;  Laterality: N/A;   HPI:  72 y.o. female presented with AMS and fever. Dx sepsis, bilateral CAP, pssible acute proctitis, ARF with hypoxia, encephalopathy. 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    Assessment / Plan / Recommendation  Clinical Impression  Pt presents with normal oropharyngeal swallowing. Mental status much improved; reluctant to eat, but willing to have yogurt and water. Oral mechanism exam was normal. Pt drank sequential boluses of thin liquids with no s/s of aspiration. She consumed purees with adequate oral attention and manipulation. No dysphagia identified. Recommend continuing on current diet. No SLP f/u is needed. Our service will s/o. D/W Rn. SLP Visit Diagnosis: Dysphagia, unspecified (R13.10)    Aspiration Risk  No limitations    Diet Recommendation   Regular solids, thin liquids  Medication Administration: Whole meds with liquid    Other  Recommendations Oral Care Recommendations: Oral care BID    Recommendations for follow up therapy are one component of a multi-disciplinary discharge planning process, led by the attending physician.  Recommendations may be updated based on patient status, additional functional criteria and  insurance authorization.  Follow up Recommendations No SLP follow up        Swallow Study   General HPI: 72 y.o. female presented with AMS and fever. Dx sepsis, bilateral CAP, pssible acute proctitis, ARF with hypoxia, encephalopathy. 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 Type of Study: Bedside Swallow Evaluation Previous Swallow Assessment: no Diet Prior to this Study: Regular;Thin liquids (Level 0) Temperature Spikes Noted: No Respiratory Status: Nasal cannula History of Recent Intubation: No Behavior/Cognition: Alert;Cooperative Oral Cavity Assessment: Within Functional Limits Oral Care Completed by SLP: No Oral Cavity - Dentition: Adequate natural dentition Vision:  Functional for self-feeding Self-Feeding Abilities: Able to feed self;Needs assist Patient Positioning: Upright in bed Baseline Vocal Quality: Normal Volitional Cough: Strong Volitional Swallow: Able to elicit    Oral/Motor/Sensory Function Overall Oral Motor/Sensory Function: Within functional limits   Ice Chips Ice chips: Within functional limits   Thin Liquid Thin Liquid: Within functional limits    Nectar Thick Nectar Thick Liquid: Not tested   Honey Thick Honey Thick Liquid: Not tested   Puree Puree: Within functional limits   Solid     Solid: Not tested (pt declined)      Juan Quam Laurice 04/27/2022,12:43 PM  Inesha Sow L. Tivis Ringer, MA CCC/SLP Clinical Specialist - [place] Office number [phone]

## 2022-04-27 NOTE — Progress Notes (Signed)
PROGRESS NOTE    Ebony Scott  [id number] DOB: 01-Oct-1950 DOA: 04/25/2022 PCP: [name], MD   Brief Narrative:  72 y.o. 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  On presentation, she was febrile to 103 with bradycardia and dyspnea. CT of the head without contrast was negative for any acute intracranial abnormality. CT of abdomen and pelvis without contrast showed possible acute proctitis, possibility of underlying neoplastic process is not excluded; patchy infiltrates in both lower lobes suggestive of atelectasis/pneumonia. She was given IV fluids and antibiotics.   Assessment & Plan:   Sepsis: Present on admission Possible bilateral community-acquired pneumonia Acute respiratory failure with hypoxia Possible acute proctitis -Presented with altered mental status, fever, diarrhea, tachycardia, tachypnea, leukocytosis with pneumonia and possible proctitis -continue Rocephin, Zithromax and Flagyl.  COVID-19/influenza/RSV PCR negative on presentation.  Blood cultures negative so far.  Urine streptococcal antigen negative.  Sputum cultures growing rare gram-positive cocci. -Currently on 4 L oxygen via nasal cannula.  Wean off as able. -GI evaluation appreciated.  Continue conservative management.  Patient continues to have diarrhea.  Will check stool for C. difficile and GI PCR.  Leukocytosis -Still significant at 15.6.  Repeat a.m. labs   Acute metabolic encephalopathy -Possibly from above.  CT of the brain negative for acute intracranial abnormality.  Fall precautions. -Monitor mental status.  PT recommends SNF placement.  TOC consulted. -Still slow to respond but more  awake. -SLP evaluation.   Chronic kidney disease stage IIIb Acute metabolic acidosis -Creatinine stable.  Repeat a.m. labs. -Start sodium bicarbonate tablets.   Chronic systolic heart failure Hypertension -Last echo on 05/26/2018 had shown EF of 30 to 35%.  Not fluid overloaded at this time.  Strict input and output Daily weights.  Fluid restriction.  Outpatient follow-up with cardiology. -Lasix, isosorbide hydralazine and ARB are still on hold.  Might have to resume: Blood pressure improving.  Off IV fluids.  Cardiology following.  Mildly positive troponins -Possible from sepsis/pneumonia.  No chest pain.  Troponins did not trend upward significantly.  I have reviewed the EKG done on presentation myself: No signs of ischemia.  No ST elevations or depressions. -Cardiology following.  Possible bilateral conjunctivitis -Continue antibiotic eyedrops.   Asthma Bronchiectasis -Continue current inhaled/nebulized regimen.  Currently on room air.  Outpatient follow-up with pulmonary. -Currently montelukast   History of DVT/PE -Continue Xarelto   History of seronegative rheumatoid arthritis -Outpatient follow-up with rheumatology.  Hold Plaquenil and prednisone.   -Complains of increasing left hand swelling and pain.  Resume prednisone.  Check x-ray of the hand and duplex ultrasound of left upper extremity to rule out DVT.  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     DVT prophylaxis: Xarelto Code Status: Full Family Communication: Daughter at bedside Disposition Plan: Status is: inpatient because: Of severity of illness.  Consultants: GI and cardiology  Procedures: None  Antimicrobials:  Anti-infectives (From admission, onward)    Start     Dose/Rate Route Frequency Ordered Stop   04/26/22 1200  cefTRIAXone (ROCEPHIN) 2 g in sodium chloride 0.9 % 100 mL IVPB        2 g 200 mL/hr over 30 Minutes Intravenous Every 24 hours 04/25/22 1454 05/01/22 1159   04/26/22  0000  metroNIDAZOLE (FLAGYL) IVPB 500 mg  500 mg 100 mL/hr over 60 Minutes Intravenous Every 12 hours 04/25/22 1454     04/25/22 2000  azithromycin (ZITHROMAX) 500 mg in sodium chloride 0.9 % 250 mL IVPB        500 mg 250 mL/hr over 60 Minutes Intravenous Every 24 hours 04/25/22 1454 04/30/22 1959   04/25/22 1245  metroNIDAZOLE (FLAGYL) IVPB 500 mg        500 mg 100 mL/hr over 60 Minutes Intravenous  Once 04/25/22 1237 04/25/22 1344   04/25/22 1215  vancomycin (VANCOREADY) IVPB 1500 mg/300 mL        1,500 mg 150 mL/hr over 120 Minutes Intravenous  Once 04/25/22 1200 04/25/22 1513   04/25/22 1200  ceFEPIme (MAXIPIME) 2 g in sodium chloride 0.9 % 100 mL IVPB        2 g 200 mL/hr over 30 Minutes Intravenous  Once 04/25/22 1157 04/25/22 1239        Subjective: Patient seen and examined at bedside.  Nursing staff reports fever this morning.  Having frequent diarrhea.  No agitation, seizures reported. Objective: Vitals:   04/26/22 2327 04/27/22 0320 04/27/22 0625 04/27/22 0652  BP: (!) 146/66 (!) 150/77 139/84   Pulse: 77 85 (!) 109   Resp: '18 18 20   '$ Temp: 98 F (36.7 C) 99 F (37.2 C) (!) 102.7 F (39.3 C)   TempSrc: Oral Oral Oral   SpO2: 97% 95% 92% 92%  Weight:      Height:        Intake/Output Summary (Last 24 hours) at 04/27/2022 0749 Last data filed at 04/27/2022 0504 Gross per 24 hour  Intake 890 ml  Output 400 ml  Net 490 ml    Filed Weights   04/25/22 1140  Weight: 62.1 kg    Examination:  General: On 4 L oxygen via nasal cannula.  No distress.  Looks chronically ill and deconditioned. Eyes: Bilateral conjunctival injection present ENT/neck: No thyromegaly.  JVD is not elevated  respiratory: Decreased breath sounds at bases bilaterally with some crackles; no wheezing  CVS: S1-S2 heard, mild intermittent tachycardia present  abdominal: Soft, nontender, slightly distended; no organomegaly, bowel sounds are heard Extremities: Left wrist and hand swelling  and tenderness present.  Trace lower extremity edema present CNS: More awake this morning, still slow to respond.  No focal neurologic deficit.  Moves extremities Lymph: No obvious lymphadenopathy Skin: No obvious ecchymosis/lesions  psych: Very flat affect.  Not agitated currently.   Data Reviewed: I have personally reviewed following labs and imaging studies  CBC: Recent Labs  Lab 04/25/22 1146 04/26/22 0556 04/27/22 0647  WBC 17.8* 14.4* 15.6*  NEUTROABS 13.0*  --  PENDING  HGB 12.8 11.3* 12.3  HCT 39.6 36.8 40.1  MCV 108.5* 115.0* 111.7*  PLT 182 135* 0000000    Basic Metabolic Panel: Recent Labs  Lab 04/25/22 1146 04/26/22 0556 04/27/22 0647  NA 141 141 142  K 3.5 4.1 4.1  CL 105 112* 111  CO2 24 15* 16*  GLUCOSE 97 59* 140*  BUN 29* 28* 18  CREATININE 1.81* 1.71* 1.23*  CALCIUM 9.3 8.5* 9.0  MG  --  2.0 1.7    GFR: Estimated Creatinine Clearance: 33.9 mL/min (A) (by C-G formula based on SCr of 1.23 mg/dL (H)). Liver Function Tests: Recent Labs  Lab 04/25/22 1146 04/26/22 0556  AST 38 35  ALT 25 21  ALKPHOS 53 45  BILITOT 1.2 1.7*  PROT 7.6 6.4*  ALBUMIN 3.5 3.0*  No results for input(s): "LIPASE", "AMYLASE" in the last 168 hours. No results for input(s): "AMMONIA" in the last 168 hours. Coagulation Profile: Recent Labs  Lab 04/25/22 1146  INR 1.2    Cardiac Enzymes: No results for input(s): "CKTOTAL", "CKMB", "CKMBINDEX", "TROPONINI" in the last 168 hours. BNP (last 3 results) No results for input(s): "PROBNP" in the last 8760 hours. HbA1C: No results for input(s): "HGBA1C" in the last 72 hours. CBG: Recent Labs  Lab 04/25/22 1140  GLUCAP 102*    Lipid Profile: No results for input(s): "CHOL", "HDL", "LDLCALC", "TRIG", "CHOLHDL", "LDLDIRECT" in the last 72 hours. Thyroid Function Tests: No results for input(s): "TSH", "T4TOTAL", "FREET4", "T3FREE", "THYROIDAB" in the last 72 hours. Anemia Panel: No results for input(s):  "VITAMINB12", "FOLATE", "FERRITIN", "TIBC", "IRON", "RETICCTPCT" in the last 72 hours. Sepsis Labs: Recent Labs  Lab 04/25/22 1146 04/25/22 1346  LATICACIDVEN 1.9 1.2     Recent Results (from the past 240 hour(s))  Culture, blood (routine x 2)     Status: None (Preliminary result)   Collection Time: 04/25/22 11:46 AM   Specimen: BLOOD  Result Value Ref Range Status   Specimen Description   Final    BLOOD RIGHT ANTECUBITAL Performed at [facility] [address], [place], [place] [address]    Special Requests   Final    BOTTLES DRAWN AEROBIC AND ANAEROBIC Blood Culture adequate volume Performed at [facility] [address], [place], [place] [address]    Culture   Final    NO GROWTH 2 DAYS Performed at [facility] [address] [place], [place] [address]    Report Status PENDING  Incomplete  Resp panel by RT-PCR (RSV, Flu A&B, Covid) Anterior Nasal Swab     Status: None   Collection Time: 04/25/22 11:47 AM   Specimen: Anterior Nasal Swab  Result Value Ref Range Status   SARS Coronavirus 2 by RT PCR NEGATIVE NEGATIVE Final    Comment: (NOTE) SARS-CoV-2 target nucleic acids are NOT DETECTED.  The SARS-CoV-2 RNA is generally detectable in upper respiratory specimens during the acute phase of infection. The lowest concentration of SARS-CoV-2 viral copies this assay can detect is 138 copies/mL. A negative result does not preclude SARS-Cov-2 infection and should not be used as the sole basis for treatment or other patient management decisions. A negative result may occur with  improper specimen collection/handling, submission of specimen other than nasopharyngeal swab, presence of viral mutation(s) within the areas targeted by this assay, and inadequate number of viral copies(<138 copies/mL). A negative result must be combined with clinical observations, patient history, and epidemiological information. The expected result is  Negative.  Fact Sheet for Patients:  EntrepreneurPulse.com.au  Fact Sheet for Healthcare Providers:  IncredibleEmployment.be  This test is no t yet approved or cleared by the [place] FDA and  has been authorized for detection and/or diagnosis of SARS-CoV-2 by FDA under an Emergency Use Authorization (EUA). This EUA will remain  in effect (meaning this test can be used) for the duration of the COVID-19 declaration under Section 564(b)(1) of the Act, 21 U.S.C.section 360bbb-3(b)(1), unless the authorization is terminated  or revoked sooner.       Influenza A by PCR NEGATIVE NEGATIVE Final   Influenza B by PCR NEGATIVE NEGATIVE Final    Comment: (NOTE) The Xpert Xpress SARS-CoV-2/FLU/RSV plus assay is intended as an aid in the diagnosis of influenza from Nasopharyngeal swab specimens and should not be used as  a sole basis for treatment. Nasal washings and aspirates are unacceptable for Xpert Xpress SARS-CoV-2/FLU/RSV testing.  Fact Sheet for Patients: EntrepreneurPulse.com.au  Fact Sheet for Healthcare Providers: IncredibleEmployment.be  This test is not yet approved or cleared by the [place] FDA and has been authorized for detection and/or diagnosis of SARS-CoV-2 by FDA under an Emergency Use Authorization (EUA). This EUA will remain in effect (meaning this test can be used) for the duration of the COVID-19 declaration under Section 564(b)(1) of the Act, 21 U.S.C. section 360bbb-3(b)(1), unless the authorization is terminated or revoked.     Resp Syncytial Virus by PCR NEGATIVE NEGATIVE Final    Comment: (NOTE) Fact Sheet for Patients: EntrepreneurPulse.com.au  Fact Sheet for Healthcare Providers: IncredibleEmployment.be  This test is not yet approved or cleared by the [place] FDA and has been authorized for detection and/or diagnosis of  SARS-CoV-2 by FDA under an Emergency Use Authorization (EUA). This EUA will remain in effect (meaning this test can be used) for the duration of the COVID-19 declaration under Section 564(b)(1) of the Act, 21 U.S.C. section 360bbb-3(b)(1), unless the authorization is terminated or revoked.  Performed at [facility], [place] [address], [place], [place] [address]   Culture, blood (routine x 2)     Status: None (Preliminary result)   Collection Time: 04/25/22 12:16 PM   Specimen: BLOOD RIGHT ARM  Result Value Ref Range Status   Specimen Description   Final    BLOOD RIGHT ARM Performed at [facility], [place] [address] [place], [place] [address]    Special Requests   Final    BOTTLES DRAWN AEROBIC AND ANAEROBIC Blood Culture results may not be optimal due to an excessive volume of blood received in culture bottles Performed at [facility] [address], [place], [place] [address]    Culture   Final    NO GROWTH 2 DAYS Performed at [facility] [address] [place], [place] [address]    Report Status PENDING  Incomplete  Expectorated Sputum Assessment w Gram Stain, Rflx to Resp Cult     Status: None   Collection Time: 04/25/22  9:13 PM   Specimen: Expectorated Sputum  Result Value Ref Range Status   Specimen Description EXPECTORATED SPUTUM  Final   Special Requests NONE  Final   Sputum evaluation   Final    THIS SPECIMEN IS ACCEPTABLE FOR SPUTUM CULTURE Performed at [facility], [place] [address] [place], [place] [address]    Report Status 04/25/2022 FINAL  Final  Culture, Respiratory w Gram Stain     Status: None (Preliminary result)   Collection Time: 04/25/22  9:13 PM  Result Value Ref Range Status   Specimen Description   Final    EXPECTORATED SPUTUM Performed at [facility] [address], [place], [place] [address]    Special Requests   Final    NONE Reflexed from  [id number] Performed at [facility], [place] [address] [place], [place] [address]    Gram Stain   Final    RARE WBC PRESENT, PREDOMINANTLY PMN RARE GRAM POSITIVE COCCI    Culture   Final    CULTURE REINCUBATED FOR BETTER GROWTH Performed at [facility], [place] [address] [place], [place] [address]    Report Status PENDING  Incomplete         Radiology Studies: CT ABDOMEN PELVIS WO CONTRAST  Result Date: 04/25/2022 CLINICAL DATA:  Abdominal pain EXAM: CT ABDOMEN AND PELVIS WITHOUT CONTRAST TECHNIQUE: Multidetector CT imaging of the abdomen and pelvis was performed following the standard protocol without IV contrast. RADIATION DOSE REDUCTION: This exam was performed according to the departmental dose-optimization program which includes automated exposure control, adjustment of the mA and/or kV according to patient size and/or use of iterative reconstruction technique. COMPARISON:  10/04/2020 FINDINGS: Lower chest: Moderate patchy infiltrates are seen in right lower lung fields. Small patchy infiltrates are seen in left lower lung field. There is no pleural effusion. Hepatobiliary: Surgical clips are seen in gallbladder fossa. There is no dilation of bile ducts. Pancreas: There is fatty infiltration. Spleen: Spleen is not enlarged. Adrenals/Urinary Tract: Adrenals are unremarkable. There is no hydronephrosis. There are no renal or ureteral stones. Urinary bladder is unremarkable. Stomach/Bowel: Small hiatal hernia is seen. Small bowel loops are not dilated. Appendix is not distinctly visualized. There is no pericecal inflammation. There is diffuse wall thickening in rectum. There is stranding in the perirectal fat planes without any loculated fluid collections. Vascular/Lymphatic: Scattered calcifications are seen in aorta and its major branches including coronary arteries. Reproductive: Uterus is not seen. Other: There is no ascites or pneumoperitoneum. There are coarse  calcifications in left breast. Musculoskeletal: Degenerative changes are noted in lumbar spine, more severe at L4-L5 level with spinal stenosis and encroachment of neural foramina. IMPRESSION: There is abnormal diffuse wall thickening in rectum along with perirectal stranding. Findings suggest acute proctitis. Possibility of underlying neoplastic process is not excluded. When the patient's clinical condition permits, endoscopy should be considered. There are patchy infiltrates in both lower lung fields, more so on the right side suggesting atelectasis/pneumonia. There is no evidence of intestinal obstruction or pneumoperitoneum. There is no hydronephrosis. Arteriosclerosis.  Lumbar spondylosis. Other findings as described in the body of the report. Electronically Signed   By: Elmer Picker M.D.   On: 04/25/2022 14:25   CT Head Wo Contrast  Result Date: 04/25/2022 CLINICAL DATA:  Altered mental status EXAM: CT HEAD WITHOUT CONTRAST TECHNIQUE: Contiguous axial images were obtained from the base of the skull through the vertex without intravenous contrast. RADIATION DOSE REDUCTION: This exam was performed according to the departmental dose-optimization program which includes automated exposure control, adjustment of the mA and/or kV according to patient size and/or use of iterative reconstruction technique. COMPARISON:  02/23/2020 FINDINGS: Brain: No acute intracranial findings are seen. There are no signs of bleeding within the cranium. Cortical sulci are prominent. There is decreased density in periventricular and subcortical white matter. Vascular: Unremarkable. Skull: No fracture is seen in calvarium. Sinuses/Orbits: There is mucosal thickening in the ethmoid sinus. Other: None. IMPRESSION: No acute intracranial findings are seen in noncontrast CT brain. Atrophy. Small-vessel disease. Electronically Signed   By: Elmer Picker M.D.   On: 04/25/2022 14:15   DG Chest Port 1 View  Result Date:  04/25/2022 CLINICAL DATA:  Fever EXAM: PORTABLE CHEST 1 VIEW COMPARISON:  X-ray 02/04/2021.  CT 04/16/2022 FINDINGS: Enlarged cardiopericardial silhouette with vascular congestion. No pneumothorax, effusion. No consolidation. Overlapping cardiac leads. Film is under penetrated. Fixation hardware along the right shoulder and cervical spine at the edge of the imaging field. IMPRESSION: Enlarged cardiopericardial silhouette with vascular congestion. Electronically Signed   By: Jill Side M.D.   On: 04/25/2022 13:10        Scheduled Meds:  allopurinol  100 mg Oral Q supper   arformoterol  15 mcg Nebulization BID   And   umeclidinium bromide  1  puff Inhalation Daily   atorvastatin  80 mg Oral Q supper   ciprofloxacin  1 drop Both Eyes Q4H while awake   clopidogrel  75 mg Oral Daily   famotidine  20 mg Oral QHS   fluticasone  2 spray Each Nare Daily   folic acid  1 mg Oral Daily   guaiFENesin  600 mg Oral BID   montelukast  10 mg Oral QPC supper   polyvinyl alcohol  1 drop Both Eyes BID   rivaroxaban  20 mg Oral Q supper   sodium bicarbonate  650 mg Oral TID   Continuous Infusions:  azithromycin Stopped (04/26/22 2116)   cefTRIAXone (ROCEPHIN)  IV Stopped (04/26/22 1210)   metronidazole Stopped (04/27/22 0113)          Aline August, MD [facility] 04/27/2022, 7:49 AM  '

## 2022-04-27 NOTE — Progress Notes (Signed)
Left upper extremity venous duplex has been completed. Preliminary results can be found in CV Proc through chart review.   04/27/22 12:15 PM Ebony Scott RVT

## 2022-04-27 NOTE — TOC Initial Note (Signed)
Transition of Care [facility]) - Initial/Assessment Note    Patient Details  Name: Ebony Scott MRN: [id number] Date of Birth: 04-Mar-1950  Transition of Care [facility]) CM/SW Contact:    Angelita Ingles, RN Phone Number:[phone]  04/27/2022, 2:10 PM  Clinical Narrative:                 [facility] consulted for patient with SNF recommendations. Patient states that she is from home from where she functions independently. Patient states that her daughter lives in her basement. Patient states that she does have PCP  [name], MD . Patient states that she does follow up on a regular basis and that she has transportation to appointments. Patient reports cane, rolling walker and 3 in 1 DME currently at home. Patient does state that she is not feeling well and not able to complete the assessment. Patient does verbalize that right now she does not want to go to SNF for rehab. Patient is requesting to wait until she is feeling better to have PT re evaluate her. Patient declines for CM to start SNF workup at this time. TOC will continue to follow.   Expected Discharge Plan: Skilled Nursing Facility Barriers to Discharge: Continued Medical Work up   Patient Goals and CMS Choice Patient states their goals for this hospitalization and ongoing recovery are:: Wants to feel better CMS Medicare.gov Compare Post Acute Care list provided to:: Patient (patient refused right now) Choice offered to / list presented to : Patient      Expected Discharge Plan and Services In-house Referral: NA Discharge Planning Services: CM Consult Post Acute Care Choice: NA Living arrangements for the past 2 months: Single Family Home                 DME Arranged: N/A DME Agency: NA       HH Arranged: NA HH Agency: NA        Prior Living Arrangements/Services Living arrangements for the past 2 months: Single Family Home Lives with:: Adult Children Patient language and need for interpreter reviewed:: Yes Do you feel safe  going back to the place where you live?: Yes      Need for Family Participation in Patient Care: Yes (Comment) Care giver support system in place?: Yes (comment) Current home services: DME (Rolling walker, cane, 3 in 1) Criminal Activity/Legal Involvement Pertinent to Current Situation/Hospitalization: No - Comment as needed  Activities of Daily Living Home Assistive Devices/Equipment: Walker (specify type), Eyeglasses, Built-in shower seat ADL Screening (condition at time of admission) Patient's cognitive ability adequate to safely complete daily activities?: Yes Is the patient deaf or have difficulty hearing?: No Does the patient have difficulty seeing, even when wearing glasses/contacts?: No Does the patient have difficulty concentrating, remembering, or making decisions?: Yes Patient able to express need for assistance with ADLs?: No Does the patient have difficulty dressing or bathing?: No Independently performs ADLs?: Yes (appropriate for developmental age) Does the patient have difficulty walking or climbing stairs?: Yes Weakness of Legs: Both Weakness of Arms/Hands: Both  Permission Sought/Granted Permission sought to share information with : Family Supports Permission granted to share information with : Yes, Verbal Permission Granted  Share Information with NAME: Nyari Bernau     Permission granted to share info w Relationship: daughter     Emotional Assessment Appearance:: Appears stated age Attitude/Demeanor/Rapport: Gracious Affect (typically observed): Pleasant, Quiet Orientation: : Oriented to Self, Oriented to Place, Oriented to  Time, Oriented to Situation Alcohol / Substance  Use: Not Applicable Psych Involvement: No (comment)  Admission diagnosis:  Pneumonia [J18.9] Fever, unspecified fever cause [R50.9] Patient Active Problem List   Diagnosis Date Noted   Abnormal CT of the abdomen 04/26/2022   Proctitis 04/26/2022   Effusion, left knee 08/09/2021    Noninfectious otitis externa of left ear 06/08/2021   Rash and other nonspecific skin eruption 04/28/2021   Iron deficiency anemia 02/24/2021   GAVE (gastric antral vascular ectasia)    Symptomatic anemia 02/04/2021   Anticoagulated    Antiplatelet or antithrombotic long-term use    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[place])  01/11/2021   Coronary artery calcification seen on CAT scan 01/11/2021   ARF (acute renal failure) ([place]) 10/04/2020   Physical deconditioning 09/25/2020   History of DVT (deep vein thrombosis) 09/24/2020   DMII (diabetes mellitus, type 2) ([place]) 09/23/2020   Cellulitis of right hand 09/22/2020   Abscess of dorsum of right hand 09/22/2020   Hoarseness [id number]   Metabolic encephalopathy [id number]   Acute encephalopathy    AMS (altered mental status) 02/23/2020   S/P reverse total shoulder arthroplasty, right    Hypokalemia    DOE (dyspnea on exertion) 02/18/2020   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   S/P reverse total shoulder arthroplasty, left 02/16/2020   Preoperative clearance 01/26/2020   Closed displaced fracture of metatarsal bone of right foot 01/20/2019   History of pulmonary embolus (PE) 12/19/2018   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   Rheumatoid arthritis ([place])    Bilateral impacted cerumen 08/18/2018   Fungal otitis externa 08/18/2018   Cerumen debris on tympanic membrane of right ear 08/18/2018   Asthmatic bronchitis with exacerbation 07/29/2018   Pulmonary embolism ([place]) 07/29/2018   Cellulitis of forearm    Fever of unknown origin (FUO) 02/10/2018   Abdominal pain 02/10/2018   Fracture of base of fifth metatarsal bone with routine healing, left    Closed fracture of fifth metatarsal bone of right foot, initial encounter 01/15/2018   DVT (deep venous thrombosis) ([place]) 01/14/2018   Hypothyroidism 01/14/2018   Depression 01/14/2018   Debility    Malnutrition of moderate degree 12/17/2017   Knee pain    History of breast cancer     Chronic pain syndrome    Fibromyalgia    Graves disease    Vasculitis ([place]) 12/13/2017   Rhabdomyolysis 12/13/2017   Dysphonia 08/10/2017   Pulmonary nodules    Chronic kidney disease, stage 3b ([place]) 04/15/2016   Hx of adenomatous colonic polyps 04/12/2016   Cough 02/09/2016   Opacity of lung on imaging study 11/03/2015   Severe persistent asthma 02/08/2015   Facial pain syndrome 02/08/2015   Insomnia 02/08/2015   Other allergic rhinitis 02/08/2015   LPRD (laryngopharyngeal reflux disease) 02/08/2015   Chronic combined systolic and diastolic CHF (congestive heart failure) ([place]) 02/08/2015   Benign paroxysmal positional vertigo 12/03/2012   Dizziness and giddiness 10/29/2012   Disequilibrium 10/29/2012   Pseudogout 02/24/2012   Community acquired pneumonia 05/31/2011   Irritable bowel syndrome 01/02/2010   Hyperlipidemia 12/23/2009   Hyperthyroidism 11/12/2006   Seasonal and perennial allergic rhinitis 11/12/2006   GERD 11/12/2006   NECK PAIN, CHRONIC 11/12/2006   OSTEOPOROSIS 11/12/2006   BREAST CANCER, HX OF 11/12/2006   PCP:  [name], MD Pharmacy:   [facility], [address] [place] [address] Phone: [phone] Fax: [phone]     Social Determinants of Health (SDOH) Social History: SDOH Screenings  Food Insecurity: No Food Insecurity (04/25/2022)  Housing: Low Risk  (04/25/2022)  Transportation Needs: No Transportation Needs (04/25/2022)  Utilities: Not At Risk (04/25/2022)  Alcohol Screen: Low Risk  (07/13/2021)  Depression (PHQ2-9): Low Risk  (07/13/2021)  Financial Resource Strain: Low Risk  (04/18/2022)  Physical Activity: Insufficiently Active (07/13/2021)  Social Connections: Moderately Isolated (07/13/2021)  Stress: No Stress Concern Present (07/13/2021)  Tobacco Use: Low Risk  (04/25/2022)   SDOH Interventions: Food Insecurity Interventions: Intervention Not  Indicated Housing Interventions: Intervention Not Indicated Transportation Interventions: Intervention Not Indicated Utilities Interventions: Intervention Not Indicated   Readmission Risk Interventions    04/27/2022    2:05 PM 09/26/2020    3:55 PM  Readmission Risk Prevention Plan  Transportation Screening Complete Complete  PCP or Specialist Appt within 5-7 Days Complete   Home Care Screening Complete   Medication Review (RN CM) Referral to Pharmacy   Medication Review ([place])  Complete  PCP or Specialist appointment within 3-5 days of discharge  Complete  HRI or [place]  Complete  SW Recovery Care/Counseling Consult  Complete  [place]  Not Applicable

## 2022-04-27 NOTE — Progress Notes (Addendum)
Progress Note  Patient Name: Ebony Scott Date of Encounter: 04/27/2022  Primary Cardiologist: Buford Dresser, MD   Subjective   Patient seen and examined at her bedside.  Her daughter was by her bedside.  She is awake sitting up eating breakfast.  Inpatient Medications    Scheduled Meds:  allopurinol  100 mg Oral Q supper   arformoterol  15 mcg Nebulization BID   And   umeclidinium bromide  1 puff Inhalation Daily   atorvastatin  80 mg Oral Q supper   ciprofloxacin  1 drop Both Eyes Q4H while awake   clopidogrel  75 mg Oral Daily   famotidine  20 mg Oral QHS   fluticasone  2 spray Each Nare Daily   folic acid  1 mg Oral Daily   guaiFENesin  600 mg Oral BID   montelukast  10 mg Oral QPC supper   polyvinyl alcohol  1 drop Both Eyes BID   predniSONE  7 mg Oral Q breakfast   rivaroxaban  20 mg Oral Q supper   sodium bicarbonate  650 mg Oral TID   Continuous Infusions:  azithromycin Stopped (04/26/22 2116)   cefTRIAXone (ROCEPHIN)  IV Stopped (04/26/22 1210)   metronidazole Stopped (04/27/22 0113)   PRN Meds: acetaminophen, acetaminophen, benzonatate, diclofenac Sodium, ipratropium-albuterol, loratadine, ondansetron **OR** ondansetron (ZOFRAN) IV, traMADol   Vital Signs    Vitals:   04/27/22 0320 04/27/22 0625 04/27/22 0652 04/27/22 0814  BP: (!) 150/77 139/84  126/67  Pulse: 85 (!) 109  98  Resp: '18 20  19  '$ Temp: 99 F (37.2 C) (!) 102.7 F (39.3 C)  98.4 F (36.9 C)  TempSrc: Oral Oral  Oral  SpO2: 95% 92% 92% 93%  Weight:      Height:        Intake/Output Summary (Last 24 hours) at 04/27/2022 1004 Last data filed at 04/27/2022 0504 Gross per 24 hour  Intake 890 ml  Output 400 ml  Net 490 ml   Filed Weights   04/25/22 1140  Weight: 62.1 kg    Telemetry    Sinus rhythm- Personally Reviewed  ECG    None- Personally Reviewed  Physical Exam    General: Comfortable Head: Atraumatic, normal size  Eyes: PEERLA, EOMI  Neck: Supple, normal  JVD Cardiac: Normal S1, S2; RRR; no murmurs, rubs, or gallops Lungs: Clear to auscultation bilaterally Abd: Soft, nontender, no hepatomegaly  Ext: warm, no edema Musculoskeletal: No deformities, BUE and BLE strength normal and equal Skin: Warm and dry, no rashes   Neuro: Alert and oriented to person, place, time, and situation, CNII-XII grossly intact, no focal deficits  Psych: Normal mood and affect   Labs    Chemistry Recent Labs  Lab 04/25/22 1146 04/26/22 0556 04/27/22 0647  NA 141 141 142  K 3.5 4.1 4.1  CL 105 112* 111  CO2 24 15* 16*  GLUCOSE 97 59* 140*  BUN 29* 28* 18  CREATININE 1.81* 1.71* 1.23*  CALCIUM 9.3 8.5* 9.0  PROT 7.6 6.4*  --   ALBUMIN 3.5 3.0*  --   AST 38 35  --   ALT 25 21  --   ALKPHOS 53 45  --   BILITOT 1.2 1.7*  --   GFRNONAA 29* 31* 46*  ANIONGAP '12 14 15     '$ Hematology Recent Labs  Lab 04/25/22 1146 04/26/22 0556 04/27/22 0647  WBC 17.8* 14.4* 15.6*  RBC 3.65* 3.20* 3.59*  HGB 12.8 11.3* 12.3  HCT 39.6 36.8 40.1  MCV 108.5* 115.0* 111.7*  MCH 35.1* 35.3* 34.3*  MCHC 32.3 30.7 30.7  RDW 15.3 15.4 15.5  PLT 182 135* 153    Cardiac EnzymesNo results for input(s): "TROPONINI" in the last 168 hours. No results for input(s): "TROPIPOC" in the last 168 hours.   BNP Recent Labs  Lab 04/25/22 1146  BNP 1,049.7*     DDimer No results for input(s): "DDIMER" in the last 168 hours.   Radiology    CT ABDOMEN PELVIS WO CONTRAST  Result Date: 04/25/2022 CLINICAL DATA:  Abdominal pain EXAM: CT ABDOMEN AND PELVIS WITHOUT CONTRAST TECHNIQUE: Multidetector CT imaging of the abdomen and pelvis was performed following the standard protocol without IV contrast. RADIATION DOSE REDUCTION: This exam was performed according to the departmental dose-optimization program which includes automated exposure control, adjustment of the mA and/or kV according to patient size and/or use of iterative reconstruction technique. COMPARISON:  10/04/2020  FINDINGS: Lower chest: Moderate patchy infiltrates are seen in right lower lung fields. Small patchy infiltrates are seen in left lower lung field. There is no pleural effusion. Hepatobiliary: Surgical clips are seen in gallbladder fossa. There is no dilation of bile ducts. Pancreas: There is fatty infiltration. Spleen: Spleen is not enlarged. Adrenals/Urinary Tract: Adrenals are unremarkable. There is no hydronephrosis. There are no renal or ureteral stones. Urinary bladder is unremarkable. Stomach/Bowel: Small hiatal hernia is seen. Small bowel loops are not dilated. Appendix is not distinctly visualized. There is no pericecal inflammation. There is diffuse wall thickening in rectum. There is stranding in the perirectal fat planes without any loculated fluid collections. Vascular/Lymphatic: Scattered calcifications are seen in aorta and its major branches including coronary arteries. Reproductive: Uterus is not seen. Other: There is no ascites or pneumoperitoneum. There are coarse calcifications in left breast. Musculoskeletal: Degenerative changes are noted in lumbar spine, more severe at L4-L5 level with spinal stenosis and encroachment of neural foramina. IMPRESSION: There is abnormal diffuse wall thickening in rectum along with perirectal stranding. Findings suggest acute proctitis. Possibility of underlying neoplastic process is not excluded. When the patient's clinical condition permits, endoscopy should be considered. There are patchy infiltrates in both lower lung fields, more so on the right side suggesting atelectasis/pneumonia. There is no evidence of intestinal obstruction or pneumoperitoneum. There is no hydronephrosis. Arteriosclerosis.  Lumbar spondylosis. Other findings as described in the body of the report. Electronically Signed   By: Elmer Picker M.D.   On: 04/25/2022 14:25   CT Head Wo Contrast  Result Date: 04/25/2022 CLINICAL DATA:  Altered mental status EXAM: CT HEAD WITHOUT  CONTRAST TECHNIQUE: Contiguous axial images were obtained from the base of the skull through the vertex without intravenous contrast. RADIATION DOSE REDUCTION: This exam was performed according to the departmental dose-optimization program which includes automated exposure control, adjustment of the mA and/or kV according to patient size and/or use of iterative reconstruction technique. COMPARISON:  02/23/2020 FINDINGS: Brain: No acute intracranial findings are seen. There are no signs of bleeding within the cranium. Cortical sulci are prominent. There is decreased density in periventricular and subcortical white matter. Vascular: Unremarkable. Skull: No fracture is seen in calvarium. Sinuses/Orbits: There is mucosal thickening in the ethmoid sinus. Other: None. IMPRESSION: No acute intracranial findings are seen in noncontrast CT brain. Atrophy. Small-vessel disease. Electronically Signed   By: Elmer Picker M.D.   On: 04/25/2022 14:15   DG Chest Port 1 View  Result Date: 04/25/2022 CLINICAL DATA:  Fever EXAM: PORTABLE CHEST 1  VIEW COMPARISON:  X-ray 02/04/2021.  CT 04/16/2022 FINDINGS: Enlarged cardiopericardial silhouette with vascular congestion. No pneumothorax, effusion. No consolidation. Overlapping cardiac leads. Film is under penetrated. Fixation hardware along the right shoulder and cervical spine at the edge of the imaging field. IMPRESSION: Enlarged cardiopericardial silhouette with vascular congestion. Electronically Signed   By: Jill Side M.D.   On: 04/25/2022 13:10    Cardiac Studies   Echocardiogram 05/25/2021: Impressions: 1. Septal and apical akinesis distal anterior wall and distal inferior  wall hypokinesis EF similar to TTE done 09/16/20. Left ventricular ejection  fraction, by estimation, is 30 to 35%. The left ventricle has moderately  decreased function. The left  ventricle has no regional wall motion abnormalities. The left ventricular  internal cavity size was moderately  dilated. Left ventricular diastolic  parameters are consistent with Grade I diastolic dysfunction (impaired  relaxation). Elevated left  ventricular end-diastolic pressure.   2. Right ventricular systolic function is normal. The right ventricular  size is normal.   3. The mitral valve is normal in structure. Mild mitral valve  regurgitation. No evidence of mitral stenosis.   4. Calcified non coronary cusp . The aortic valve is tricuspid. There is  mild calcification of the aortic valve. Aortic valve regurgitation is not  visualized. Aortic valve sclerosis is present, with no evidence of aortic  valve stenosis.   5. The inferior vena cava is normal in size with greater than 50%  respiratory variability, suggesting right atrial pressure of 3 mmHg.   Patient Profile     72 y.o. female  history of CAD s/p DES to proximal to mid LAD and 1st Diag in 12/2020, chronic combined CHF with EF of 30-35% on Echo in 05/2021, SVT, prior PE on lifelong anticoagulation, asthma, tracheobronchomalacia on CPAP, bronchiectasis, vocal cord dysfunction, seronegative rheumatoid arthritis, and CKD stage III.  Assessment & Plan    Chronic combined heart failure-appears to be euvolemic clinically Elevated troponin-type II, no need for any further ischemic evaluation at this time CAD status post PCI no anginal symptoms. Hyperlipidemia CKD stage IIIb Sepsis  She is not taking p.o. and can restart home guideline medical therapy.  She is on valsartan at home which I will just start the patient on low-dose Entresto now.  24-26 mg twice daily.  She also takes BiDil please wait till tomorrow if she is doing well with this and restart her BiDil. Continue her statin medications. Continue Xarelto. Continue to monitor creatinine, known chronic kidney disease avoid nephrotoxin Sepsis being managed by primary team. Euvolemic at this time - continue to monitor and clinically assess the need for starting diuretics.  For  questions or updates, please contact [place] Please consult [email or web address] for contact info under Cardiology/STEMI.      Signed, Amberlin Utke, DO  04/27/2022, 10:04 AM

## 2022-04-28 DIAGNOSIS — R935 Abnormal findings on diagnostic imaging of other abdominal regions, including retroperitoneum: Secondary | ICD-10-CM | POA: Diagnosis not present

## 2022-04-28 DIAGNOSIS — K6289 Other specified diseases of anus and rectum: Secondary | ICD-10-CM | POA: Diagnosis not present

## 2022-04-28 DIAGNOSIS — I5043 Acute on chronic combined systolic (congestive) and diastolic (congestive) heart failure: Secondary | ICD-10-CM | POA: Diagnosis not present

## 2022-04-28 DIAGNOSIS — J189 Pneumonia, unspecified organism: Secondary | ICD-10-CM | POA: Diagnosis not present

## 2022-04-28 LAB — CBC WITH DIFFERENTIAL/PLATELET
Abs Immature Granulocytes: 0.33 10*3/uL — ABNORMAL HIGH (ref 0.00–0.07)
Basophils Absolute: 0 10*3/uL (ref 0.0–0.1)
Basophils Relative: 0 %
Eosinophils Absolute: 0.1 10*3/uL (ref 0.0–0.5)
Eosinophils Relative: 1 %
HCT: 32.5 % — ABNORMAL LOW (ref 36.0–46.0)
Hemoglobin: 10.7 g/dL — ABNORMAL LOW (ref 12.0–15.0)
Immature Granulocytes: 2 %
Lymphocytes Relative: 4 %
Lymphs Abs: 0.6 10*3/uL — ABNORMAL LOW (ref 0.7–4.0)
MCH: 35.5 pg — ABNORMAL HIGH (ref 26.0–34.0)
MCHC: 32.9 g/dL (ref 30.0–36.0)
MCV: 108 fL — ABNORMAL HIGH (ref 80.0–100.0)
Monocytes Absolute: 1.6 10*3/uL — ABNORMAL HIGH (ref 0.1–1.0)
Monocytes Relative: 11 %
Neutro Abs: 12.2 10*3/uL — ABNORMAL HIGH (ref 1.7–7.7)
Neutrophils Relative %: 82 %
Platelets: 154 10*3/uL (ref 150–400)
RBC: 3.01 MIL/uL — ABNORMAL LOW (ref 3.87–5.11)
RDW: 15.4 % (ref 11.5–15.5)
WBC: 14.9 10*3/uL — ABNORMAL HIGH (ref 4.0–10.5)
nRBC: 0 % (ref 0.0–0.2)

## 2022-04-28 LAB — BASIC METABOLIC PANEL
Anion gap: 6 (ref 5–15)
BUN: 18 mg/dL (ref 8–23)
CO2: 23 mmol/L (ref 22–32)
Calcium: 8 mg/dL — ABNORMAL LOW (ref 8.9–10.3)
Chloride: 107 mmol/L (ref 98–111)
Creatinine, Ser: 1.03 mg/dL — ABNORMAL HIGH (ref 0.44–1.00)
GFR, Estimated: 57 mL/min — ABNORMAL LOW (ref >60)
Glucose, Bld: 160 mg/dL — ABNORMAL HIGH (ref 70–99)
Potassium: 3.9 mmol/L (ref 3.5–5.1)
Sodium: 136 mmol/L (ref 135–145)

## 2022-04-28 LAB — GASTROINTESTINAL PANEL BY PCR, STOOL (REPLACES STOOL CULTURE)

## 2022-04-28 LAB — CULTURE, RESPIRATORY W GRAM STAIN: Culture: NORMAL

## 2022-04-28 LAB — MAGNESIUM: Magnesium: 1.9 mg/dL (ref 1.7–2.4)

## 2022-04-28 MED ORDER — ISOSORB DINITRATE-HYDRALAZINE 20-37.5 MG PO TABS
1.0000 | ORAL_TABLET | Freq: Two times a day (BID) | ORAL | Status: DC
  Administered 2022-04-28 – 2022-05-05 (×15): 1 via ORAL
  Filled 2022-04-28 (×15): qty 1

## 2022-04-28 MED ORDER — FUROSEMIDE 40 MG PO TABS
40.0000 mg | ORAL_TABLET | Freq: Every day | ORAL | Status: DC
  Administered 2022-04-29 – 2022-05-05 (×7): 40 mg via ORAL
  Filled 2022-04-28 (×7): qty 1

## 2022-04-28 MED ORDER — TRIAMCINOLONE ACETONIDE 0.1 % EX CREA
TOPICAL_CREAM | CUTANEOUS | Status: DC | PRN
  Filled 2022-04-28: qty 15

## 2022-04-28 MED ORDER — LOPERAMIDE HCL 2 MG PO CAPS: 2.0000 mg | ORAL_CAPSULE | ORAL | Status: DC | PRN

## 2022-04-28 MED ORDER — EMPAGLIFLOZIN 10 MG PO TABS
10.0000 mg | ORAL_TABLET | Freq: Every day | ORAL | Status: DC
  Administered 2022-04-28 – 2022-05-05 (×8): 10 mg via ORAL
  Filled 2022-04-28 (×8): qty 1

## 2022-04-28 NOTE — Progress Notes (Signed)
HISTORY OF PRESENT ILLNESS:  Ebony Scott is a 72 y.o. female admitted with pneumonia.  CT scan shows rectal inflammatory changes of uncertain clinical significance.  Patient tells me that she had 1 bowel movement yesterday.  No bowel movements today.  No bleeding.  No abdominal pain.  Overall she feels poor and tired.  Nausea with no appetite.  Still with cough and exertional dyspnea  REVIEW OF SYSTEMS:  All non-GI ROS negative unless otherwise stated in the HPI. Past Medical History:  Diagnosis Date   Abscess of dorsum of right hand 09/22/2020   Acute deep vein thrombosis (DVT) of right lower extremity ([place]) 12/13/2017   Allergic rhinitis    Anemia    Angio-edema    Anxiety    pt denies dx   Arthritis    Phreesia 10/21/2019   Asthma    Phreesia 10/21/2019   Breast cancer ([place]) 1998   Left breast, in remission   Bronchiectasis ([place])    Cataract    REMOVED   CHF (congestive heart failure) ([place])    Clostridium difficile colitis [id number]   Complication of anesthesia    "had hard time waking up from it several times" (02/20/2012), no anesthesia problems since 2014   Coronary artery disease    Depression    "some; don't take anything for it" (02/20/2012), pt denies dx as of 06/02/21   Diverticulosis    DVT (deep venous thrombosis) ([place])    Dyspnea    all the time   Fibromyalgia 11/2011   GAVE (gastric antral vascular ectasia)    GERD (gastroesophageal reflux disease)    Graves disease    Headache(784.0)    "related to allergies; more at different times during the year" (02/20/2012)   Hemorrhoids    Hiatal hernia    back and neck   History of blood transfusion 2023   1 unit, 5 units of Iron   Hx of adenomatous colonic polyps 04/12/2016   Hypercholesteremia    good cholesterol is high   Hypothyroidism    IBS (irritable bowel syndrome)    Moderate persistent asthma    -FeV1 72% 2011, -IgE 102 2011, CT sinus Neg 2011   Osteomyelitis of second toe of right foot ([place])  09/23/2020   Osteoporosis    on reclast yearly   Peripheral vascular disease ([place]) 2019   DVTs (3) lungs and leg   Pneumonia 04/2011; ~ 11/2011   "double; single" (02/20/2012)   Septic olecranon bursitis of right elbow 09/22/2020   Seronegative rheumatoid arthritis ([place])    Dr. Lahoma Rocker   SIRS (systemic inflammatory response syndrome) ([place]) 02/10/2018   Sleep apnea    uses cpap nightly   Tracheobronchomalacia     Past Surgical History:  Procedure Laterality Date   ABDOMINAL HYSTERECTOMY N/A    Phreesia 10/21/2019   ANTERIOR AND POSTERIOR REPAIR  1990's   APPENDECTOMY     ARGON LASER APPLICATION  [id number]   Procedure: ARGON LASER APPLICATION;  Surgeon: Yetta Flock, MD;  Location: [facility];  Service: Gastroenterology;;   BREAST LUMPECTOMY  1998   left   BREAST SURGERY N/A    Phreesia 10/21/2019   BRONCHIAL WASHINGS  04/05/2020   Procedure: BRONCHIAL WASHINGS;  Surgeon: Freddi Starr, MD;  Location: [facility];  Service: Pulmonary;;   CAPSULOTOMY Right 08/28/2021   Procedure: CAPSULOTOMY;  Surgeon: Felipa Furnace, DPM;  Location: [facility];  Service: Podiatry;  Laterality: Right;   CARPOMETACARPEL ([place]) FUSION OF THUMB WITH AUTOGRAFT  FROM RADIUS  ~ 2009   "both thumbs" (02/20/2012)   CATARACT EXTRACTION W/ INTRAOCULAR LENS  IMPLANT, BILATERAL  2012   CERVICAL DISCECTOMY  10/2001   C5-C6   CERVICAL FUSION  2003   C3-C4   CHOLECYSTECTOMY     COLONOSCOPY     CORONARY STENT INTERVENTION N/A 01/11/2021   Procedure: CORONARY STENT INTERVENTION;  Surgeon: Leonie Man, MD;  Location: [facility];  Service: Cardiovascular;  Laterality: N/A;   DEBRIDEMENT TENNIS ELBOW  ?1970's   right   ESOPHAGOGASTRODUODENOSCOPY     ESOPHAGOGASTRODUODENOSCOPY (EGD) WITH PROPOFOL N/A 02/05/2021   Procedure: ESOPHAGOGASTRODUODENOSCOPY (EGD) WITH PROPOFOL;  Surgeon: Yetta Flock, MD;  [facility]: [facility];  Service: Gastroenterology;  Laterality: N/A;   EYE  SURGERY N/A    Phreesia 10/21/2019   HAMMER TOE SURGERY Right 08/28/2021   Procedure: HAMMER TOE REPAIR 2ND TOE RIGHT FOOT;  Surgeon: Felipa Furnace, DPM;  Location: [facility];  Service: Podiatry;  Laterality: Right;   HYSTERECTOMY     I & D EXTREMITY Right 09/22/2020   Procedure: IRRIGATION AND DEBRIDEMENT RIGHT HAND AND ELBOW;  Surgeon: Marchia Bond, MD;  Location: [facility];  Service: Orthopedics;  Laterality: Right;   KNEE ARTHROPLASTY  ?1990's   "?right; w/cartilage repair" (02/20/2012)   MASS EXCISION Left 08/28/2021   Procedure: EXCISION OF SOFT TISSUE  MASS LEFT FOOT;  Surgeon: Felipa Furnace, DPM;  Location: [facility];  Service: Podiatry;  Laterality: Left;   NASAL SEPTUM SURGERY  1980's   POSTERIOR CERVICAL FUSION/FORAMINOTOMY  2004   "failed initial fusion; rewired  anterior neck" (02/20/2012)   REVERSE SHOULDER ARTHROPLASTY Right 02/16/2020   Procedure: REVERSE SHOULDER ARTHROPLASTY;  Surgeon: Marchia Bond, MD;  Location: [facility];  Service: Orthopedics;  Laterality: Right;   RIGHT/LEFT HEART CATH AND CORONARY ANGIOGRAPHY N/A 01/11/2021   Procedure: RIGHT/LEFT HEART CATH AND CORONARY ANGIOGRAPHY;  Surgeon: Leonie Man, MD;  Location: [facility];  Service: Cardiovascular;  Laterality: N/A;   SPINE SURGERY N/A    Phreesia 10/21/2019   TONSILLECTOMY  ~ 1953   VESICOVAGINAL FISTULA CLOSURE W/ TAH  1988   VIDEO BRONCHOSCOPY Bilateral 08/23/2016   Procedure: VIDEO BRONCHOSCOPY WITH FLUORO;  Surgeon: Javier Glazier, MD;  [facility]: [facility];  Service: Cardiopulmonary;  Laterality: Bilateral;   VIDEO BRONCHOSCOPY N/A 04/05/2020   Procedure: VIDEO BRONCHOSCOPY WITHOUT FLUORO;  Surgeon: Freddi Starr, MD;  [facility]: [facility];  Service: Pulmonary;  Laterality: N/A;    Social History Ebony Scott  reports that she has never smoked. She has been exposed to tobacco smoke. She has never used smokeless tobacco. She reports that she does not currently use alcohol. She reports that  she does not use drugs.  family history includes Allergies in her father and mother; Arthritis in her father and mother; Colitis in her daughter; Colon cancer in an other family member; Diabetes in her maternal grandfather and mother; Heart disease in her brother, father, and mother; Lung cancer in her mother; Stroke in her father.  Allergies  Allergen Reactions   Baclofen Anaphylaxis and Other (See Comments)    Altered mental status requiring a 3-day hospital stay- patient became unresponsive   "Put me in a coma for five days", Altered mental status requiring a 3-day hospital stay- patient became unresponsive   Dust Mite Extract Shortness Of Breath and Other (See Comments)    "sneezing" (02/20/2012) too   Molds & Smuts Shortness Of Breath   Morphine  And Related Hives and Itching   Other Shortness Of Breath and Other (See Comments)    Grass and weeds "sneezing; filled sinuses" (02/20/2012)   Penicillins Rash and Other (See Comments)    "welts" (02/20/2012)  Tolerated Ancef 02/16/20   Rofecoxib Swelling and Other (See Comments)    Vioxx- feet swelling   Shellfish Allergy Anaphylaxis, Shortness Of Breath, Itching and Rash   Shrimp Extract Anaphylaxis and Other (See Comments)    ALL SHELLFISH   Tetracycline Hcl Nausea And Vomiting   Tetracycline Hcl Other (See Comments)    GI Intolerance   Xolair [Omalizumab] Other (See Comments)    Caused Blood clot   Zoledronic Acid Other (See Comments)    Reclast- Fever, Put in hospital, dr said it was a reaction from a reaction    Fever, inability to walk, Put in hospital  Fever, Put in hospital, dr said it was a reaction from a reaction , Other reaction(s): Unknown   Dilaudid [Hydromorphone Hcl] Itching   Hydrocodone-Acetaminophen Itching   Levofloxacin Other (See Comments)    GI upset  Other Reaction(s): GI Intolerance, Other (See Comments)  REACTION: GI upset, GI upset   Oxycodone Hcl Nausea And Vomiting   Oxycodone Hcl Other (See  Comments)    GI Intolerance   Paroxetine Nausea And Vomiting and Other (See Comments)    GI Intolerance also   Celecoxib Swelling and Other (See Comments)    Feet swelling   Diltiazem Hcl Swelling   Lactose Other (See Comments)    Bloating and gas   Lactose Intolerance (Gi) Other (See Comments)    Bloating and gas   Oxycodone-Acetaminophen Itching   Rituximab Other (See Comments)    Anemia    Tree Extract Other (See Comments)    "tested and told I was allergic to it; never experienced a reaction to it" (02/20/2012)   Gamunex [Immune Globulin] Itching and Rash    Tolerates hizentra    Penicillin G Procaine Rash and Other (See Comments)    Welts, also       PHYSICAL EXAMINATION: Vital signs: BP (!) 147/72 (BP Location: Right Arm)   Pulse 83   Temp 98.5 F (36.9 C) (Oral)   Resp 18   Ht '5\' 1"'$  (1.549 m)   Wt 62.1 kg   SpO2 95%   BMI 25.89 kg/m  General: Fatigued and ill-appearing but no acute distress HEENT: Sclerae are anicteric, t Lungs: Rhonchi at the bases Heart: Regular Abdomen: soft, nontender, nondistended, no obvious ascites, no peritoneal signs, normal bowel sounds. No organomegaly. Extremities: 1+ edema Psychiatric: alert and oriented x3. Cooperative   LABS: White blood cell count 14.9, hemoglobin 10.7     ASSESSMENT:  1.  Pneumonia.  Principal problem 2.  Abnormal rectum on CT.  Uncertain clinical significance.  No relevant GI symptoms. 3.  Multiple medical problems  PLAN:  1.  Treat pneumonia and other associated medical issues 2.  Outpatient GI follow-up after she is well.  At that time one could consider repeat imaging or flexible sigmoidoscopy.  Her primary gastroenterologist is Dr. Carlean Purl. GI will sign off at this time but we are available if needed for questions or problems.  Docia Chuck. Geri Seminole., M.D. [facility] Division of Gastroenterology

## 2022-04-28 NOTE — Progress Notes (Signed)
PROGRESS NOTE    Ebony Scott  [id number] DOB: 1950-03-15 DOA: 04/25/2022 PCP: [name], MD   Brief Narrative:  72 y.o. 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  On presentation, she was febrile to 103 with bradycardia and dyspnea. CT of the head without contrast was negative for any acute intracranial abnormality. CT of abdomen and pelvis without contrast showed possible acute proctitis, possibility of underlying neoplastic process is not excluded; patchy infiltrates in both lower lobes suggestive of atelectasis/pneumonia. She was given IV fluids and antibiotics.   Assessment & Plan:   Sepsis: Present on admission Possible bilateral community-acquired pneumonia Acute respiratory failure with hypoxia Possible acute proctitis -Presented with altered mental status, fever, diarrhea, tachycardia, tachypnea, leukocytosis with pneumonia and possible proctitis -continue Rocephin, Zithromax and Flagyl.  COVID-19/influenza/RSV PCR negative on presentation.  Blood cultures negative so far.  Urine streptococcal antigen negative.  Sputum cultures growing rare gram-positive cocci. -Currently still on 4 L oxygen via nasal cannula.  Wean off as able. -GI evaluation appreciated.  Continue conservative management.  Patient continues to have diarrhea.  Stool for C. difficile negative.  Stool for GI PCR pending  Leukocytosis -Still significant at 14.9.  Repeat a.m. labs   Acute metabolic encephalopathy -Possibly from above.  CT of the brain negative for acute intracranial abnormality.  Fall precautions. -Monitor mental status.  PT recommends SNF placement.  TOC consulted. -Still slow to  respond but more awake. -Diet as per SLP recommendations.   Chronic kidney disease stage IIIb Acute metabolic acidosis -Creatinine stable.  Labs pending today.  Repeat a.m. labs. -Continue sodium bicarbonate tablets.   Chronic systolic heart failure Hypertension -Last echo on 05/26/2018 had shown EF of 30 to 35%.  Not fluid overloaded at this time.  Strict input and output Daily weights.  Fluid restriction.  Outpatient follow-up with cardiology. -Lasix, isosorbide hydralazine and ARB are still on hold.  Cardiology following: [name] has been started by cardiology on 04/27/2022.  Mildly positive troponins -Possible from sepsis/pneumonia.  No chest pain.  Troponins did not trend upward significantly.  I have reviewed the EKG done on presentation myself: No signs of ischemia.  No ST elevations or depressions. -Cardiology following.  Possible bilateral conjunctivitis -Continue antibiotic eyedrops.   Asthma Bronchiectasis -Continue current inhaled/nebulized regimen.  Outpatient follow-up with pulmonary. -Currently montelukast   History of DVT/PE -Continue Xarelto   History of seronegative rheumatoid arthritis -Outpatient follow-up with rheumatology.  Hold Plaquenil    Prednisone resumed from 04/27/2022 onwards.  Left upper extremity duplex ultrasound negative for DVT.  X-ray of left hand shows features of rheumatoid arthritis.  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     DVT prophylaxis: Xarelto Code Status: Full Family Communication: Daughter at bedside on 04/27/2022 Disposition Plan: Status is: inpatient because: Of severity of illness.  Consultants: GI and cardiology  Procedures: None  Antimicrobials:  Anti-infectives (From admission, onward)    Start     Dose/Rate Route Frequency Ordered Stop   04/26/22 1200  cefTRIAXone (ROCEPHIN) 2 g in sodium chloride 0.9 % 100 mL IVPB        2 g 200 mL/hr over 30 Minutes Intravenous Every 24 hours 04/25/22 1454 05/01/22  1159   04/26/22 0000  metroNIDAZOLE (FLAGYL) IVPB 500 mg  500 mg 100 mL/hr over 60 Minutes Intravenous Every 12 hours 04/25/22 1454     04/25/22 2000  azithromycin (ZITHROMAX) 500 mg in sodium chloride 0.9 % 250 mL IVPB        500 mg 250 mL/hr over 60 Minutes Intravenous Every 24 hours 04/25/22 1454 04/30/22 1959   04/25/22 1245  metroNIDAZOLE (FLAGYL) IVPB 500 mg        500 mg 100 mL/hr over 60 Minutes Intravenous  Once 04/25/22 1237 04/25/22 1344   04/25/22 1215  vancomycin (VANCOREADY) IVPB 1500 mg/300 mL        1,500 mg 150 mL/hr over 120 Minutes Intravenous  Once 04/25/22 1200 04/25/22 1513   04/25/22 1200  ceFEPIme (MAXIPIME) 2 g in sodium chloride 0.9 % 100 mL IVPB        2 g 200 mL/hr over 30 Minutes Intravenous  Once 04/25/22 1157 04/25/22 1239        Subjective: Patient seen and examined at bedside.  Still having intermittent diarrhea.  No fever, vomiting, chest pain or worsening shortness of breath reported.  Oral intake remains poor.   Objective: Vitals:   04/27/22 1212 04/27/22 1638 04/27/22 2020 04/28/22 0442  BP: 122/69 (!) 140/74 (!) 100/50 (!) 147/72  Pulse: 92 91 87 83  Resp: '18 18 18 18  '$ Temp: 98.4 F (36.9 C) 99.6 F (37.6 C) 97.9 F (36.6 C) 98.5 F (36.9 C)  TempSrc: Oral Oral Oral Oral  SpO2: 95% 95% 98% 94%  Weight:      Height:        Intake/Output Summary (Last 24 hours) at 04/28/2022 [id number] Last data filed at 04/28/2022 0503 Gross per 24 hour  Intake --  Output 400 ml  Net -400 ml    Filed Weights   04/25/22 1140  Weight: 62.1 kg    Examination:  General: No acute distress.  Still on 4 L oxygen via nasal cannula looks chronically ill and deconditioned. Eyes: Bilateral conjunctival injection still present  ENT/neck: No obvious JVD elevation or palpable neck masses noted respiratory: Bilateral decreased breath sounds at bases with scattered crackles  CVS: Currently rate controlled; S1 and S2 are heard  abdominal: Soft, nontender,  distended mildly; no organomegaly, normal bowel sounds heard  extremities: Left wrist and hand swelling and mild tenderness present.  Mild lower extremity edema present; no clubbing  CNS: Awake, still very slow to respond.  No focal neurologic deficit.  Able to move extremities Lymph: No palpable lymphadenopathy noted  skin: No obvious petechiae/rashes  psych: Showing no signs of agitation.  Flat affect.  Data Reviewed: I have personally reviewed following labs and imaging studies  CBC: Recent Labs  Lab 04/25/22 1146 04/26/22 0556 04/27/22 0647 04/28/22 0706  WBC 17.8* 14.4* 15.6* 14.9*  NEUTROABS 13.0*  --  12.2* 12.2*  HGB 12.8 11.3* 12.3 10.7*  HCT 39.6 36.8 40.1 32.5*  MCV 108.5* 115.0* 111.7* 108.0*  PLT 182 135* 153 [id number]    Basic Metabolic Panel: Recent Labs  Lab 04/25/22 1146 04/26/22 0556 04/27/22 0647  NA 141 141 142  K 3.5 4.1 4.1  CL 105 112* 111  CO2 24 15* 16*  GLUCOSE 97 59* 140*  BUN 29* 28* 18  CREATININE 1.81* 1.71* 1.23*  CALCIUM 9.3 8.5* 9.0  MG  --  2.0 1.7    GFR: Estimated Creatinine Clearance: 33.9 mL/min (A) (by C-G formula based on SCr of 1.23 mg/dL (H)). Liver Function Tests: Recent Labs  Lab 04/25/22 1146 04/26/22  0556  AST 38 35  ALT 25 21  ALKPHOS 53 45  BILITOT 1.2 1.7*  PROT 7.6 6.4*  ALBUMIN 3.5 3.0*    No results for input(s): "LIPASE", "AMYLASE" in the last 168 hours. No results for input(s): "AMMONIA" in the last 168 hours. Coagulation Profile: Recent Labs  Lab 04/25/22 1146  INR 1.2    Cardiac Enzymes: No results for input(s): "CKTOTAL", "CKMB", "CKMBINDEX", "TROPONINI" in the last 168 hours. BNP (last 3 results) No results for input(s): "PROBNP" in the last 8760 hours. HbA1C: No results for input(s): "HGBA1C" in the last 72 hours. CBG: Recent Labs  Lab 04/25/22 1140  GLUCAP 102*    Lipid Profile: No results for input(s): "CHOL", "HDL", "LDLCALC", "TRIG", "CHOLHDL", "LDLDIRECT" in the last 72  hours. Thyroid Function Tests: No results for input(s): "TSH", "T4TOTAL", "FREET4", "T3FREE", "THYROIDAB" in the last 72 hours. Anemia Panel: No results for input(s): "VITAMINB12", "FOLATE", "FERRITIN", "TIBC", "IRON", "RETICCTPCT" in the last 72 hours. Sepsis Labs: Recent Labs  Lab 04/25/22 1146 04/25/22 1346  LATICACIDVEN 1.9 1.2     Recent Results (from the past 240 hour(s))  Culture, blood (routine x 2)     Status: None (Preliminary result)   Collection Time: 04/25/22 11:46 AM   Specimen: BLOOD  Result Value Ref Range Status   Specimen Description   Final    BLOOD RIGHT ANTECUBITAL Performed at [facility] [address] [place], [place] [address]    Special Requests   Final    BOTTLES DRAWN AEROBIC AND ANAEROBIC Blood Culture adequate volume Performed at [facility] [address], [place], [place] [address]    Culture   Final    NO GROWTH 2 DAYS Performed at [facility] [address], [place], [place] [address]    Report Status PENDING  Incomplete  Resp panel by RT-PCR (RSV, Flu A&B, Covid) Anterior Nasal Swab     Status: None   Collection Time: 04/25/22 11:47 AM   Specimen: Anterior Nasal Swab  Result Value Ref Range Status   SARS Coronavirus 2 by RT PCR NEGATIVE NEGATIVE Final    Comment: (NOTE) SARS-CoV-2 target nucleic acids are NOT DETECTED.  The SARS-CoV-2 RNA is generally detectable in upper respiratory specimens during the acute phase of infection. The lowest concentration of SARS-CoV-2 viral copies this assay can detect is 138 copies/mL. A negative result does not preclude SARS-Cov-2 infection and should not be used as the sole basis for treatment or other patient management decisions. A negative result may occur with  improper specimen collection/handling, submission of specimen other than nasopharyngeal swab, presence of viral mutation(s) within the areas targeted by this assay, and inadequate number  of viral copies(<138 copies/mL). A negative result must be combined with clinical observations, patient history, and epidemiological information. The expected result is Negative.  Fact Sheet for Patients:  EntrepreneurPulse.com.au  Fact Sheet for Healthcare Providers:  IncredibleEmployment.be  This test is no t yet approved or cleared by the [place] FDA and  has been authorized for detection and/or diagnosis of SARS-CoV-2 by FDA under an Emergency Use Authorization (EUA). This EUA will remain  in effect (meaning this test can be used) for the duration of the COVID-19 declaration under Section 564(b)(1) of the Act, 21 U.S.C.section 360bbb-3(b)(1), unless the authorization is terminated  or revoked sooner.       Influenza A by PCR NEGATIVE NEGATIVE Final   Influenza B by PCR NEGATIVE NEGATIVE Final  Comment: (NOTE) The Xpert Xpress SARS-CoV-2/FLU/RSV plus assay is intended as an aid in the diagnosis of influenza from Nasopharyngeal swab specimens and should not be used as a sole basis for treatment. Nasal washings and aspirates are unacceptable for Xpert Xpress SARS-CoV-2/FLU/RSV testing.  Fact Sheet for Patients: EntrepreneurPulse.com.au  Fact Sheet for Healthcare Providers: IncredibleEmployment.be  This test is not yet approved or cleared by the [place] FDA and has been authorized for detection and/or diagnosis of SARS-CoV-2 by FDA under an Emergency Use Authorization (EUA). This EUA will remain in effect (meaning this test can be used) for the duration of the COVID-19 declaration under Section 564(b)(1) of the Act, 21 U.S.C. section 360bbb-3(b)(1), unless the authorization is terminated or revoked.     Resp Syncytial Virus by PCR NEGATIVE NEGATIVE Final    Comment: (NOTE) Fact Sheet for Patients: EntrepreneurPulse.com.au  Fact Sheet for Healthcare  Providers: IncredibleEmployment.be  This test is not yet approved or cleared by the [place] FDA and has been authorized for detection and/or diagnosis of SARS-CoV-2 by FDA under an Emergency Use Authorization (EUA). This EUA will remain in effect (meaning this test can be used) for the duration of the COVID-19 declaration under Section 564(b)(1) of the Act, 21 U.S.C. section 360bbb-3(b)(1), unless the authorization is terminated or revoked.  Performed at [facility], [place] [address], [place], [place] [address]   Culture, blood (routine x 2)     Status: None (Preliminary result)   Collection Time: 04/25/22 12:16 PM   Specimen: BLOOD RIGHT ARM  Result Value Ref Range Status   Specimen Description   Final    BLOOD RIGHT ARM Performed at [facility], [place] [address], [place], [place] [address]    Special Requests   Final    BOTTLES DRAWN AEROBIC AND ANAEROBIC Blood Culture results may not be optimal due to an excessive volume of blood received in culture bottles Performed at [facility] [address], [place], [place] [address]    Culture   Final    NO GROWTH 2 DAYS Performed at [facility] [address] [place], [place] [address]    Report Status PENDING  Incomplete  Expectorated Sputum Assessment w Gram Stain, Rflx to Resp Cult     Status: None   Collection Time: 04/25/22  9:13 PM   Specimen: Expectorated Sputum  Result Value Ref Range Status   Specimen Description EXPECTORATED SPUTUM  Final   Special Requests NONE  Final   Sputum evaluation   Final    THIS SPECIMEN IS ACCEPTABLE FOR SPUTUM CULTURE Performed at [facility], [place] [address], [place], [place] [address]    Report Status 04/25/2022 FINAL  Final  Culture, Respiratory w Gram Stain     Status: None (Preliminary result)   Collection Time: 04/25/22  9:13 PM  Result Value Ref Range Status   Specimen Description   Final     EXPECTORATED SPUTUM Performed at [facility] [address], [place], [place] [address]    Special Requests   Final    NONE Reflexed from [id number] Performed at [facility], [place] [address], [place], [place] [address]    Gram Stain   Final    RARE WBC PRESENT, PREDOMINANTLY PMN RARE GRAM POSITIVE COCCI    Culture   Final    CULTURE REINCUBATED FOR BETTER GROWTH Performed at [facility], [place] [address], [place], [place] [address]  Report Status PENDING  Incomplete  C Difficile Quick Screen w PCR reflex     Status: None   Collection Time: 04/27/22  8:05 AM   Specimen: STOOL  Result Value Ref Range Status   C Diff antigen NEGATIVE NEGATIVE Final   C Diff toxin NEGATIVE NEGATIVE Final   C Diff interpretation No C. difficile detected.  Final    Comment: Performed at [facility], [place] [address], [place], [place] [address]         Radiology Studies: VAS [place] UPPER EXTREMITY VENOUS DUPLEX  Result Date: 04/27/2022 UPPER VENOUS STUDY  Patient Name:  Ebony Scott  Date of Exam:   04/27/2022 Medical Rec #: [id number]     Accession #:    [id number] Date of Birth: 11-29-1950      Patient Gender: F Patient Age:   14 years Exam Location:  [facility] Procedure:      VAS [place] UPPER EXTREMITY VENOUS DUPLEX Referring Phys: Aline August --------------------------------------------------------------------------------  Indications: Swelling Risk Factors: None identified. Limitations: Poor ultrasound/tissue interface. Comparison Study: No prior studies. Performing Technologist: Oliver Hum RVT  Examination Guidelines: A complete evaluation includes B-mode imaging, spectral Doppler, color Doppler, and power Doppler as needed of all accessible portions of each vessel. Bilateral testing is considered an integral part of a complete examination. Limited examinations for reoccurring indications may be performed as noted.  Right Findings:  +----------+------------+---------+-----------+----------+-------+ RIGHT     CompressiblePhasicitySpontaneousPropertiesSummary +----------+------------+---------+-----------+----------+-------+ Subclavian    Full       Yes       Yes                      +----------+------------+---------+-----------+----------+-------+  Left Findings: +----------+------------+---------+-----------+----------+-------+ LEFT      CompressiblePhasicitySpontaneousPropertiesSummary +----------+------------+---------+-----------+----------+-------+ IJV           Full       Yes       Yes                      +----------+------------+---------+-----------+----------+-------+ Subclavian    Full       Yes       Yes                      +----------+------------+---------+-----------+----------+-------+ Axillary      Full       Yes       Yes                      +----------+------------+---------+-----------+----------+-------+ Brachial      Full       Yes       Yes                      +----------+------------+---------+-----------+----------+-------+ Radial        Full                                          +----------+------------+---------+-----------+----------+-------+ Ulnar         Full                                          +----------+------------+---------+-----------+----------+-------+ Cephalic      Full                                          +----------+------------+---------+-----------+----------+-------+  Basilic       Full                                          +----------+------------+---------+-----------+----------+-------+  Summary:  Right: No evidence of thrombosis in the subclavian.  Left: No evidence of deep vein thrombosis in the upper extremity. No evidence of superficial vein thrombosis in the upper extremity.  *See table(s) above for measurements and observations.     Preliminary    DG Hand 2 View Left  Result Date:  04/27/2022 CLINICAL DATA:  History of rheumatoid arthritis. Worsening left hand pain. EXAM: LEFT HAND - 2 VIEW COMPARISON:  Left hand radiographs 03/07/2022 FINDINGS: Postsurgical changes are again seen of trapezium resection. Mild high-grade peripheral degenerative osteophytosis at the base of the thumb metacarpal. Mild distal scaphoid peripheral degenerative osteophytosis and distal lateral subacromial cystic change. Severe index finger DIP joint and fourth finger PIP joint space narrowing with bone-on-bone contact. Moderate to large index finger PIP degenerative osteophytes. Moderate to severe third DIP, moderate index finger PIP, and mild-to-moderate fourth and fifth DIP and fifth PIP joint space narrowing and peripheral osteophytosis. Severe third and moderate second, fourth, and fifth metacarpophalangeal joint space narrowing. Mild chondrocalcinosis within the triangular fibrocartilage complex. IMPRESSION: 1. Postsurgical changes of trapezium resection. 2. 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 3. Moderate to severe third and moderate second, fourth, and fifth metacarpophalangeal joint space narrowing and peripheral osteophytosis. This can be seen with osteoarthritis or rheumatoid arthritis. Electronically Signed   By: Yvonne Kendall M.D.   On: 04/27/2022 11:08        Scheduled Meds:  allopurinol  100 mg Oral Q supper   arformoterol  15 mcg Nebulization BID   And   umeclidinium bromide  1 puff Inhalation Daily   atorvastatin  80 mg Oral Q supper   ciprofloxacin  1 drop Both Eyes Q4H while awake   clopidogrel  75 mg Oral Daily   famotidine  20 mg Oral QHS   fluticasone  2 spray Each Nare Daily   folic acid  1 mg Oral Daily   guaiFENesin  600 mg Oral BID   montelukast  10 mg Oral QPC supper   polyvinyl alcohol  1 drop Both Eyes BID   predniSONE  7 mg Oral Q breakfast   rivaroxaban  20 mg Oral Q supper   sacubitril-valsartan  1 tablet Oral BID   sodium bicarbonate  650  mg Oral TID   Continuous Infusions:  azithromycin 500 mg (04/27/22 2026)   cefTRIAXone (ROCEPHIN)  IV 2 g (04/27/22 1213)   metronidazole 500 mg (04/27/22 2313)          Aline August, MD [facility] 04/28/2022, 8:22 AM  '

## 2022-04-28 NOTE — Progress Notes (Signed)
Rounding Note    Patient Name: Ebony Scott Date of Encounter: 04/28/2022  [place] Cardiologist: Buford Dresser, MD   Subjective   Mentation improving. Some ongoing cough.   Inpatient Medications    Scheduled Meds:  allopurinol  100 mg Oral Q supper   arformoterol  15 mcg Nebulization BID   And   umeclidinium bromide  1 puff Inhalation Daily   atorvastatin  80 mg Oral Q supper   ciprofloxacin  1 drop Both Eyes Q4H while awake   clopidogrel  75 mg Oral Daily   famotidine  20 mg Oral QHS   fluticasone  2 spray Each Nare Daily   folic acid  1 mg Oral Daily   guaiFENesin  600 mg Oral BID   montelukast  10 mg Oral QPC supper   polyvinyl alcohol  1 drop Both Eyes BID   predniSONE  7 mg Oral Q breakfast   rivaroxaban  20 mg Oral Q supper   sacubitril-valsartan  1 tablet Oral BID   sodium bicarbonate  650 mg Oral TID   Continuous Infusions:  azithromycin 500 mg (04/27/22 2026)   cefTRIAXone (ROCEPHIN)  IV 2 g (04/27/22 1213)   metronidazole 500 mg (04/27/22 2313)   PRN Meds: acetaminophen, acetaminophen, benzonatate, diclofenac Sodium, ipratropium-albuterol, loratadine, ondansetron **OR** ondansetron (ZOFRAN) IV, traMADol   Vital Signs    Vitals:   04/27/22 1638 04/27/22 2020 04/28/22 0442 04/28/22 0908  BP: (!) 140/74 (!) 100/50 (!) 147/72   Pulse: 91 87 83   Resp: '18 18 18   '$ Temp: 99.6 F (37.6 C) 97.9 F (36.6 C) 98.5 F (36.9 C)   TempSrc: Oral Oral Oral   SpO2: 95% 98% 94% 95%  Weight:      Height:        Intake/Output Summary (Last 24 hours) at 04/28/2022 1138 Last data filed at 04/28/2022 0503 Gross per 24 hour  Intake --  Output 400 ml  Net -400 ml      04/25/2022   11:40 AM 04/06/2022    9:36 AM 03/19/2022   10:56 AM  Last 3 Weights  Weight (lbs) 137 lb 137 lb 137 lb  Weight (kg) 62.143 kg 62.143 kg 62.143 kg      Telemetry    NSR - Personally Reviewed  ECG    N/a - Personally Reviewed  Physical Exam   GEN: No acute  distress.   Neck: No JVD Cardiac: RRR, no murmurs, rubs, or gallops.  Respiratory: Clear to auscultation bilaterally. GI: Soft, nontender, non-distended  MS: No edema; No deformity. Neuro:  Nonfocal  Psych: Normal affect   Labs    High Sensitivity Troponin:   Recent Labs  Lab 04/25/22 1146 04/25/22 1346  TROPONINIHS 112* 130*     Chemistry Recent Labs  Lab 04/25/22 1146 04/26/22 0556 04/27/22 0647 04/28/22 0706  NA 141 141 142 136  K 3.5 4.1 4.1 3.9  CL 105 112* 111 107  CO2 24 15* 16* 23  GLUCOSE 97 59* 140* 160*  BUN 29* 28* 18 18  CREATININE 1.81* 1.71* 1.23* 1.03*  CALCIUM 9.3 8.5* 9.0 8.0*  MG  --  2.0 1.7 1.9  PROT 7.6 6.4*  --   --   ALBUMIN 3.5 3.0*  --   --   AST 38 35  --   --   ALT 25 21  --   --   ALKPHOS 53 45  --   --   BILITOT 1.2 1.7*  --   --  GFRNONAA 29* 31* 46* 57*  ANIONGAP '12 14 15 6    '$ Lipids No results for input(s): "CHOL", "TRIG", "HDL", "LABVLDL", "LDLCALC", "CHOLHDL" in the last 168 hours.  Hematology Recent Labs  Lab 04/26/22 0556 04/27/22 0647 04/28/22 0706  WBC 14.4* 15.6* 14.9*  RBC 3.20* 3.59* 3.01*  HGB 11.3* 12.3 10.7*  HCT 36.8 40.1 32.5*  MCV 115.0* 111.7* 108.0*  MCH 35.3* 34.3* 35.5*  MCHC 30.7 30.7 32.9  RDW 15.4 15.5 15.4  PLT 135* 153 154   Thyroid No results for input(s): "TSH", "FREET4" in the last 168 hours.  BNP Recent Labs  Lab 04/25/22 1146  BNP 1,049.7*    DDimer No results for input(s): "DDIMER" in the last 168 hours.   Radiology    VAS [place] UPPER EXTREMITY VENOUS DUPLEX  Result Date: 04/27/2022 UPPER VENOUS STUDY  Patient Name:  Ebony Scott  Date of Exam:   04/27/2022 Medical Rec #: [id number]     Accession #:    [id number] Date of Birth: 11-16-1950      Patient Gender: F Patient Age:   72 years Exam Location:  [facility] Procedure:      VAS [place] UPPER EXTREMITY VENOUS DUPLEX Referring Phys: Aline August --------------------------------------------------------------------------------   Indications: Swelling Risk Factors: None identified. Limitations: Poor ultrasound/tissue interface. Comparison Study: No prior studies. Performing Technologist: Oliver Hum RVT  Examination Guidelines: A complete evaluation includes B-mode imaging, spectral Doppler, color Doppler, and power Doppler as needed of all accessible portions of each vessel. Bilateral testing is considered an integral part of a complete examination. Limited examinations for reoccurring indications may be performed as noted.  Right Findings: +----------+------------+---------+-----------+----------+-------+ RIGHT     CompressiblePhasicitySpontaneousPropertiesSummary +----------+------------+---------+-----------+----------+-------+ Subclavian    Full       Yes       Yes                      +----------+------------+---------+-----------+----------+-------+  Left Findings: +----------+------------+---------+-----------+----------+-------+ LEFT      CompressiblePhasicitySpontaneousPropertiesSummary +----------+------------+---------+-----------+----------+-------+ IJV           Full       Yes       Yes                      +----------+------------+---------+-----------+----------+-------+ Subclavian    Full       Yes       Yes                      +----------+------------+---------+-----------+----------+-------+ Axillary      Full       Yes       Yes                      +----------+------------+---------+-----------+----------+-------+ Brachial      Full       Yes       Yes                      +----------+------------+---------+-----------+----------+-------+ Radial        Full                                          +----------+------------+---------+-----------+----------+-------+ Ulnar         Full                                          +----------+------------+---------+-----------+----------+-------+  Cephalic      Full                                           +----------+------------+---------+-----------+----------+-------+ Basilic       Full                                          +----------+------------+---------+-----------+----------+-------+  Summary:  Right: No evidence of thrombosis in the subclavian.  Left: No evidence of deep vein thrombosis in the upper extremity. No evidence of superficial vein thrombosis in the upper extremity.  *See table(s) above for measurements and observations.     Preliminary    DG Hand 2 View Left  Result Date: 04/27/2022 CLINICAL DATA:  History of rheumatoid arthritis. Worsening left hand pain. EXAM: LEFT HAND - 2 VIEW COMPARISON:  Left hand radiographs 03/07/2022 FINDINGS: Postsurgical changes are again seen of trapezium resection. Mild high-grade peripheral degenerative osteophytosis at the base of the thumb metacarpal. Mild distal scaphoid peripheral degenerative osteophytosis and distal lateral subacromial cystic change. Severe index finger DIP joint and fourth finger PIP joint space narrowing with bone-on-bone contact. Moderate to large index finger PIP degenerative osteophytes. Moderate to severe third DIP, moderate index finger PIP, and mild-to-moderate fourth and fifth DIP and fifth PIP joint space narrowing and peripheral osteophytosis. Severe third and moderate second, fourth, and fifth metacarpophalangeal joint space narrowing. Mild chondrocalcinosis within the triangular fibrocartilage complex. IMPRESSION: 1. Postsurgical changes of trapezium resection. 2. 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 3. Moderate to severe third and moderate second, fourth, and fifth metacarpophalangeal joint space narrowing and peripheral osteophytosis. This can be seen with osteoarthritis or rheumatoid arthritis. Electronically Signed   By: Yvonne Kendall M.D.   On: 04/27/2022 11:08    Cardiac Studies     Patient Profile     72 y.o. 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   Assessment & Plan    1.Chronic combined systolic/diastolic HF - [id number] echo: LVEF 30-35%, grade I dd - admitted with AMS, pneumonia secondary to CAP - in setting of sepsis home HF meds initially held, also due to AMS was not taking PO  -historically medical therapy has been limited due to labile bps - bisoprolol stopped due to hypotension  - from [facility] review she is steadily now taking PO meds. Entresot was restarted yesterday, restart jardiance '10mg'$  and bidil today - restart her home oral lasix '40mg'$  daily tomorrow.   2. History of CAD - s/p DES to proximal to mid LAD and 1st Diag in 12/2020 - trop elevation mild in setting of sepsis consistent with demand ischemia - no plans for ischemic testing  3. 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 AKI on CKD - AKI on amdission, resolving with Cr down to 1.03 from peak of 1.81  5. Sepsis/pneumonia - per primary team  6. AMS - per primary team   No further cardiology recs at this time, we will sign off inpatient care and arrange outpatient f/u  For questions or updates, please contact [place] Please consult [email or web address] for contact info under        Signed, Carlyle Dolly, MD  04/28/2022, 11:38 AM

## 2022-04-29 DIAGNOSIS — J189 Pneumonia, unspecified organism: Secondary | ICD-10-CM | POA: Diagnosis not present

## 2022-04-29 DIAGNOSIS — K6289 Other specified diseases of anus and rectum: Secondary | ICD-10-CM | POA: Diagnosis not present

## 2022-04-29 LAB — BASIC METABOLIC PANEL
Anion gap: 10 (ref 5–15)
BUN: 18 mg/dL (ref 8–23)
CO2: 21 mmol/L — ABNORMAL LOW (ref 22–32)
Calcium: 8.2 mg/dL — ABNORMAL LOW (ref 8.9–10.3)
Chloride: 112 mmol/L — ABNORMAL HIGH (ref 98–111)
Creatinine, Ser: 0.91 mg/dL (ref 0.44–1.00)
GFR, Estimated: >60 mL/min (ref >60)
Glucose, Bld: 81 mg/dL (ref 70–99)
Potassium: 3.4 mmol/L — ABNORMAL LOW (ref 3.5–5.1)
Sodium: 143 mmol/L (ref 135–145)

## 2022-04-29 LAB — CBC WITH DIFFERENTIAL/PLATELET
Abs Immature Granulocytes: 0.11 10*3/uL — ABNORMAL HIGH (ref 0.00–0.07)
Basophils Absolute: 0.1 10*3/uL (ref 0.0–0.1)
Basophils Relative: 0 %
Eosinophils Absolute: 0.5 10*3/uL (ref 0.0–0.5)
Eosinophils Relative: 4 %
HCT: 31.7 % — ABNORMAL LOW (ref 36.0–46.0)
Hemoglobin: 10.4 g/dL — ABNORMAL LOW (ref 12.0–15.0)
Immature Granulocytes: 1 %
Lymphocytes Relative: 7 %
Lymphs Abs: 0.9 10*3/uL (ref 0.7–4.0)
MCH: 34.9 pg — ABNORMAL HIGH (ref 26.0–34.0)
MCHC: 32.8 g/dL (ref 30.0–36.0)
MCV: 106.4 fL — ABNORMAL HIGH (ref 80.0–100.0)
Monocytes Absolute: 1.7 10*3/uL — ABNORMAL HIGH (ref 0.1–1.0)
Monocytes Relative: 13 %
Neutro Abs: 9.9 10*3/uL — ABNORMAL HIGH (ref 1.7–7.7)
Neutrophils Relative %: 75 %
Platelets: 157 10*3/uL (ref 150–400)
RBC: 2.98 MIL/uL — ABNORMAL LOW (ref 3.87–5.11)
RDW: 15.5 % (ref 11.5–15.5)
WBC: 13.2 10*3/uL — ABNORMAL HIGH (ref 4.0–10.5)
nRBC: 0 % (ref 0.0–0.2)

## 2022-04-29 LAB — MAGNESIUM: Magnesium: 1.8 mg/dL (ref 1.7–2.4)

## 2022-04-29 NOTE — Progress Notes (Signed)
PROGRESS NOTE    Ebony Scott  [id number] DOB: 1951-01-06 DOA: 04/25/2022 PCP: [name], MD   Brief Narrative:  72 y.o. 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  On presentation, she was febrile to 103 with bradycardia and dyspnea. CT of the head without contrast was negative for any acute intracranial abnormality. CT of abdomen and pelvis without contrast showed possible acute proctitis, possibility of underlying neoplastic process is not excluded; patchy infiltrates in both lower lobes suggestive of atelectasis/pneumonia. She was given IV fluids and antibiotics.   Assessment & Plan:   Sepsis: Present on admission Possible bilateral community-acquired pneumonia Acute respiratory failure with hypoxia Possible acute proctitis -Presented with altered mental status, fever, diarrhea, tachycardia, tachypnea, leukocytosis with pneumonia and possible proctitis -continue Rocephin, Zithromax and Flagyl.  COVID-19/influenza/RSV PCR negative on presentation.  Blood cultures negative so far.  Urine streptococcal antigen negative.  Sputum cultures negative so far. -still on 4 L oxygen via nasal cannula.  Wean off as able. -GI evaluation appreciated.  Continue conservative management.  Patient continues to have diarrhea.  Stool for C. difficile and GI PCR negative.  Use Imodium as needed.  Leukocytosis -Slightly improving to 13.2 today.  Repeat a.m. labs   Acute metabolic encephalopathy -Possibly from above.  CT of the brain negative for acute intracranial abnormality.  Fall precautions. -Monitor mental status.  PT recommends SNF placement.  TOC consulted. -Still slow to respond but more  awake. -Diet as per SLP recommendations.   Chronic kidney disease stage IIIb Acute metabolic acidosis -Creatinine stable. Repeat a.m. labs. -Continue sodium bicarbonate tablets.   Chronic systolic heart failure Hypertension -Last echo on 05/26/2018 had shown EF of 30 to 35%.  Not fluid overloaded at this time.  Strict input and output Daily weights.  Fluid restriction.  Outpatient follow-up with cardiology. -Cardiology signed off on 04/28/2022: [name] has been started by cardiology on 04/27/2022.  BiDil and Wilder Glade resumed on 04/28/2022 and Lasix to be resumed from today.  Mildly positive troponins -Possible from sepsis/pneumonia.  No chest pain.  Troponins did not trend upward significantly.  I have reviewed the EKG done on presentation myself: No signs of ischemia.  No ST elevations or depressions. -No further workup is needed as per cardiology patient  Possible bilateral conjunctivitis -Continue antibiotic eyedrops.   Asthma Bronchiectasis -Continue current inhaled/nebulized regimen.  Outpatient follow-up with pulmonary. -Currently montelukast   History of DVT/PE -Continue Xarelto   History of seronegative rheumatoid arthritis -Outpatient follow-up with rheumatology.  Hold Plaquenil    Home dose of prednisone resumed from 04/27/2022 onwards.  Left upper extremity duplex ultrasound negative for DVT.  X-ray of left hand shows features of rheumatoid arthritis.  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     DVT prophylaxis: Xarelto Code Status: Full Family Communication: Daughter on phone on 04/28/2022  disposition Plan: Status is: inpatient because: Of severity of illness.  Consultants: GI and cardiology  Procedures: None  Antimicrobials:  Anti-infectives (From admission, onward)    Start     Dose/Rate Route Frequency Ordered Stop   04/26/22 1200  cefTRIAXone (ROCEPHIN) 2 g in sodium chloride 0.9 % 100 mL IVPB        2 g 200 mL/hr over 30 Minutes Intravenous Every  24 hours 04/25/22 1454 05/01/22 1159  04/26/22 0000  metroNIDAZOLE (FLAGYL) IVPB 500 mg        500 mg 100 mL/hr over 60 Minutes Intravenous Every 12 hours 04/25/22 1454     04/25/22 2000  azithromycin (ZITHROMAX) 500 mg in sodium chloride 0.9 % 250 mL IVPB        500 mg 250 mL/hr over 60 Minutes Intravenous Every 24 hours 04/25/22 1454 04/30/22 1959   04/25/22 1245  metroNIDAZOLE (FLAGYL) IVPB 500 mg        500 mg 100 mL/hr over 60 Minutes Intravenous  Once 04/25/22 1237 04/25/22 1344   04/25/22 1215  vancomycin (VANCOREADY) IVPB 1500 mg/300 mL        1,500 mg 150 mL/hr over 120 Minutes Intravenous  Once 04/25/22 1200 04/25/22 1513   04/25/22 1200  ceFEPIme (MAXIPIME) 2 g in sodium chloride 0.9 % 100 mL IVPB        2 g 200 mL/hr over 30 Minutes Intravenous  Once 04/25/22 1157 04/25/22 1239        Subjective: Patient seen and examined at bedside.  Slow to respond.  Denies worsening shortness of breath, cough, abdominal pain.   Objective: Vitals:   04/28/22 1341 04/28/22 1924 04/28/22 2241 04/29/22 0414  BP: (!) 143/76  (!) 108/40 126/61  Pulse: 87  87 93  Resp: '18  16 18  '$ Temp: 99.1 F (37.3 C)  98.7 F (37.1 C) 100 F (37.8 C)  TempSrc: Oral  Oral Oral  SpO2: 95% 96% 92% 91%  Weight:      Height:        Intake/Output Summary (Last 24 hours) at 04/29/2022 0806 Last data filed at 04/29/2022 0500 Gross per 24 hour  Intake --  Output 250 ml  Net -250 ml    Filed Weights   04/25/22 1140  Weight: 62.1 kg    Examination:  General: On 4 L oxygen via nasal cannula.  No distress currently.  Looks chronically ill and deconditioned. Eyes: Bilateral conjunctival injection still present but improving ENT/neck: No palpable thyromegaly or obvious JVD elevation noted  respiratory: Decreased breath sounds at bases bilaterally with some crackles CVS: S1-S2 heard; rate controlled mostly  abdominal: Soft, nontender, distended slightly; no organomegaly, bowel sounds heard normally   extremities: Left wrist and hand swelling and mild tenderness present.  No signs of; trace lower extremity edema present  CNS: Alert and awake; slow to respond but improving.  No focal neurologic deficit.  Moving extremities Lymph: No obvious lymphadenopathy  skin: No obvious lesions/ecchymosis psych: Showing no signs of agitation.  Flat affect.  Data Reviewed: I have personally reviewed following labs and imaging studies  CBC: Recent Labs  Lab 04/25/22 1146 04/26/22 0556 04/27/22 0647 04/28/22 0706 04/29/22 0636  WBC 17.8* 14.4* 15.6* 14.9* 13.2*  NEUTROABS 13.0*  --  12.2* 12.2* 9.9*  HGB 12.8 11.3* 12.3 10.7* 10.4*  HCT 39.6 36.8 40.1 32.5* 31.7*  MCV 108.5* 115.0* 111.7* 108.0* 106.4*  PLT 182 135* 153 154 [id number]    Basic Metabolic Panel: Recent Labs  Lab 04/25/22 1146 04/26/22 0556 04/27/22 0647 04/28/22 0706 04/29/22 0636  NA 141 141 142 136 143  K 3.5 4.1 4.1 3.9 3.4*  CL 105 112* 111 107 112*  CO2 24 15* 16* 23 21*  GLUCOSE 97 59* 140* 160* 81  BUN 29* 28* '18 18 18  '$ CREATININE 1.81* 1.71* 1.23* 1.03* 0.91  CALCIUM 9.3 8.5* 9.0 8.0* 8.2*  MG  --  2.0 1.7 1.9 1.8  GFR: Estimated Creatinine Clearance: 45.8 mL/min (by C-G formula based on SCr of 0.91 mg/dL). Liver Function Tests: Recent Labs  Lab 04/25/22 1146 04/26/22 0556  AST 38 35  ALT 25 21  ALKPHOS 53 45  BILITOT 1.2 1.7*  PROT 7.6 6.4*  ALBUMIN 3.5 3.0*    No results for input(s): "LIPASE", "AMYLASE" in the last 168 hours. No results for input(s): "AMMONIA" in the last 168 hours. Coagulation Profile: Recent Labs  Lab 04/25/22 1146  INR 1.2    Cardiac Enzymes: No results for input(s): "CKTOTAL", "CKMB", "CKMBINDEX", "TROPONINI" in the last 168 hours. BNP (last 3 results) No results for input(s): "PROBNP" in the last 8760 hours. HbA1C: No results for input(s): "HGBA1C" in the last 72 hours. CBG: Recent Labs  Lab 04/25/22 1140  GLUCAP 102*    Lipid Profile: No results for  input(s): "CHOL", "HDL", "LDLCALC", "TRIG", "CHOLHDL", "LDLDIRECT" in the last 72 hours. Thyroid Function Tests: No results for input(s): "TSH", "T4TOTAL", "FREET4", "T3FREE", "THYROIDAB" in the last 72 hours. Anemia Panel: No results for input(s): "VITAMINB12", "FOLATE", "FERRITIN", "TIBC", "IRON", "RETICCTPCT" in the last 72 hours. Sepsis Labs: Recent Labs  Lab 04/25/22 1146 04/25/22 1346  LATICACIDVEN 1.9 1.2     Recent Results (from the past 240 hour(s))  Culture, blood (routine x 2)     Status: None (Preliminary result)   Collection Time: 04/25/22 11:46 AM   Specimen: BLOOD  Result Value Ref Range Status   Specimen Description   Final    BLOOD RIGHT ANTECUBITAL Performed at [facility] [address] [place], [place] [address]    Special Requests   Final    BOTTLES DRAWN AEROBIC AND ANAEROBIC Blood Culture adequate volume Performed at [facility] [address] [place], [place] [address]    Culture   Final    NO GROWTH 3 DAYS Performed at [facility], [place] [address], [place],  [address]    Report Status PENDING  Incomplete  Resp panel by RT-PCR (RSV, Flu A&B, Covid) Anterior Nasal Swab     Status: None   Collection Time: 04/25/22 11:47 AM   Specimen: Anterior Nasal Swab  Result Value Ref Range Status   SARS Coronavirus 2 by RT PCR NEGATIVE NEGATIVE Final    Comment: (NOTE) SARS-CoV-2 target nucleic acids are NOT DETECTED.  The SARS-CoV-2 RNA is generally detectable in upper respiratory specimens during the acute phase of infection. The lowest concentration of SARS-CoV-2 viral copies this assay can detect is 138 copies/mL. A negative result does not preclude SARS-Cov-2 infection and should not be used as the sole basis for treatment or other patient management decisions. A negative result may occur with  improper specimen collection/handling, submission of specimen other than nasopharyngeal swab, presence of  viral mutation(s) within the areas targeted by this assay, and inadequate number of viral copies(<138 copies/mL). A negative result must be combined with clinical observations, patient history, and epidemiological information. The expected result is Negative.  Fact Sheet for Patients:  EntrepreneurPulse.com.au  Fact Sheet for Healthcare Providers:  IncredibleEmployment.be  This test is no t yet approved or cleared by the [place] FDA and  has been authorized for detection and/or diagnosis of SARS-CoV-2 by FDA under an Emergency Use Authorization (EUA). This EUA will remain  in effect (meaning this test can be used) for the duration of the COVID-19 declaration under Section 564(b)(1) of the Act, 21 U.S.C.section 360bbb-3(b)(1), unless the authorization is terminated  or revoked sooner.  Influenza A by PCR NEGATIVE NEGATIVE Final   Influenza B by PCR NEGATIVE NEGATIVE Final    Comment: (NOTE) The Xpert Xpress SARS-CoV-2/FLU/RSV plus assay is intended as an aid in the diagnosis of influenza from Nasopharyngeal swab specimens and should not be used as a sole basis for treatment. Nasal washings and aspirates are unacceptable for Xpert Xpress SARS-CoV-2/FLU/RSV testing.  Fact Sheet for Patients: EntrepreneurPulse.com.au  Fact Sheet for Healthcare Providers: IncredibleEmployment.be  This test is not yet approved or cleared by the [place] FDA and has been authorized for detection and/or diagnosis of SARS-CoV-2 by FDA under an Emergency Use Authorization (EUA). This EUA will remain in effect (meaning this test can be used) for the duration of the COVID-19 declaration under Section 564(b)(1) of the Act, 21 U.S.C. section 360bbb-3(b)(1), unless the authorization is terminated or revoked.     Resp Syncytial Virus by PCR NEGATIVE NEGATIVE Final    Comment: (NOTE) Fact Sheet for  Patients: EntrepreneurPulse.com.au  Fact Sheet for Healthcare Providers: IncredibleEmployment.be  This test is not yet approved or cleared by the [place] FDA and has been authorized for detection and/or diagnosis of SARS-CoV-2 by FDA under an Emergency Use Authorization (EUA). This EUA will remain in effect (meaning this test can be used) for the duration of the COVID-19 declaration under Section 564(b)(1) of the Act, 21 U.S.C. section 360bbb-3(b)(1), unless the authorization is terminated or revoked.  Performed at [facility], [place] [address], [place], [place] [address]   Culture, blood (routine x 2)     Status: None (Preliminary result)   Collection Time: 04/25/22 12:16 PM   Specimen: BLOOD RIGHT ARM  Result Value Ref Range Status   Specimen Description   Final    BLOOD RIGHT ARM Performed at [facility], [place] [address], [place], [place] [address]    Special Requests   Final    BOTTLES DRAWN AEROBIC AND ANAEROBIC Blood Culture results may not be optimal due to an excessive volume of blood received in culture bottles Performed at [facility] [address] [place], [place] [address]    Culture   Final    NO GROWTH 3 DAYS Performed at [facility], [place] [address] [place], [place] [address]    Report Status PENDING  Incomplete  Expectorated Sputum Assessment w Gram Stain, Rflx to Resp Cult     Status: None   Collection Time: 04/25/22  9:13 PM   Specimen: Expectorated Sputum  Result Value Ref Range Status   Specimen Description EXPECTORATED SPUTUM  Final   Special Requests NONE  Final   Sputum evaluation   Final    THIS SPECIMEN IS ACCEPTABLE FOR SPUTUM CULTURE Performed at [facility], [place] [address] [place], [place] [address]    Report Status 04/25/2022 FINAL  Final  Culture, Respiratory w Gram Stain     Status: None   Collection Time: 04/25/22  9:13 PM   Result Value Ref Range Status   Specimen Description   Final    EXPECTORATED SPUTUM Performed at [facility] [address] [place], [place] [address]    Special Requests   Final    NONE Reflexed from [id number] Performed at [facility], [place] [address], [place], [place] [address]    Gram Stain   Final    RARE WBC PRESENT, PREDOMINANTLY PMN RARE GRAM POSITIVE COCCI    Culture   Final    Normal respiratory  flora-no Staph aureus or Pseudomonas seen Performed at [facility] [address] [place], [place] [address]    Report Status 04/28/2022 FINAL  Final  Gastrointestinal Panel by PCR , Stool     Status: None   Collection Time: 04/27/22  8:05 AM   Specimen: STOOL  Result Value Ref Range Status   Campylobacter species NOT DETECTED NOT DETECTED Final   Plesimonas shigelloides NOT DETECTED NOT DETECTED Final   Salmonella species NOT DETECTED NOT DETECTED Final   Yersinia enterocolitica NOT DETECTED NOT DETECTED Final   Vibrio species NOT DETECTED NOT DETECTED Final   Vibrio cholerae NOT DETECTED NOT DETECTED Final   Enteroaggregative E coli (EAEC) NOT DETECTED NOT DETECTED Final   Enteropathogenic E coli (EPEC) NOT DETECTED NOT DETECTED Final   Enterotoxigenic E coli (ETEC) NOT DETECTED NOT DETECTED Final   Shiga like toxin producing E coli (STEC) NOT DETECTED NOT DETECTED Final   Shigella/Enteroinvasive E coli (EIEC) NOT DETECTED NOT DETECTED Final   Cryptosporidium NOT DETECTED NOT DETECTED Final   Cyclospora cayetanensis NOT DETECTED NOT DETECTED Final   Entamoeba histolytica NOT DETECTED NOT DETECTED Final   Giardia lamblia NOT DETECTED NOT DETECTED Final   Adenovirus F40/41 NOT DETECTED NOT DETECTED Final   Astrovirus NOT DETECTED NOT DETECTED Final   Norovirus GI/GII NOT DETECTED NOT DETECTED Final   Rotavirus A NOT DETECTED NOT DETECTED Final   Sapovirus (I, II, IV, and V) NOT DETECTED NOT DETECTED Final    Comment: Performed  at [facility], [place] [place], [place] [address]  C Difficile Quick Screen w PCR reflex     Status: None   Collection Time: 04/27/22  8:05 AM   Specimen: STOOL  Result Value Ref Range Status   C Diff antigen NEGATIVE NEGATIVE Final   C Diff toxin NEGATIVE NEGATIVE Final   C Diff interpretation No C. difficile detected.  Final    Comment: Performed at [facility], [place] [address] [place], [place] [address]         Radiology Studies: VAS [place] UPPER EXTREMITY VENOUS DUPLEX  Result Date: 04/27/2022 UPPER VENOUS STUDY  Patient Name:  AMBERDAWN ROSENBURG  Date of Exam:   04/27/2022 Medical Rec #: [id number]     Accession #:    [id number] Date of Birth: 13-Nov-1950      Patient Gender: F Patient Age:   48 years Exam Location:  [facility] Procedure:      VAS [place] UPPER EXTREMITY VENOUS DUPLEX Referring Phys: Aline August --------------------------------------------------------------------------------  Indications: Swelling Risk Factors: None identified. Limitations: Poor ultrasound/tissue interface. Comparison Study: No prior studies. Performing Technologist: [facility] RVT  Examination Guidelines: A complete evaluation includes B-mode imaging, spectral Doppler, color Doppler, and power Doppler as needed of all accessible portions of each vessel. Bilateral testing is considered an integral part of a complete examination. Limited examinations for reoccurring indications may be performed as noted.  Right Findings: +----------+------------+---------+-----------+----------+-------+ RIGHT     CompressiblePhasicitySpontaneousPropertiesSummary +----------+------------+---------+-----------+----------+-------+ Subclavian    Full       Yes       Yes                      +----------+------------+---------+-----------+----------+-------+  Left Findings: +----------+------------+---------+-----------+----------+-------+ LEFT       CompressiblePhasicitySpontaneousPropertiesSummary +----------+------------+---------+-----------+----------+-------+ IJV           Full       Yes       Yes                      +----------+------------+---------+-----------+----------+-------+  Subclavian    Full       Yes       Yes                      +----------+------------+---------+-----------+----------+-------+ Axillary      Full       Yes       Yes                      +----------+------------+---------+-----------+----------+-------+ Brachial      Full       Yes       Yes                      +----------+------------+---------+-----------+----------+-------+ Radial        Full                                          +----------+------------+---------+-----------+----------+-------+ Ulnar         Full                                          +----------+------------+---------+-----------+----------+-------+ Cephalic      Full                                          +----------+------------+---------+-----------+----------+-------+ Basilic       Full                                          +----------+------------+---------+-----------+----------+-------+  Summary:  Right: No evidence of thrombosis in the subclavian.  Left: No evidence of deep vein thrombosis in the upper extremity. No evidence of superficial vein thrombosis in the upper extremity.  *See table(s) above for measurements and observations.     Preliminary    DG Hand 2 View Left  Result Date: 04/27/2022 CLINICAL DATA:  History of rheumatoid arthritis. Worsening left hand pain. EXAM: LEFT HAND - 2 VIEW COMPARISON:  Left hand radiographs 03/07/2022 FINDINGS: Postsurgical changes are again seen of trapezium resection. Mild high-grade peripheral degenerative osteophytosis at the base of the thumb metacarpal. Mild distal scaphoid peripheral degenerative osteophytosis and distal lateral subacromial cystic change. Severe index finger  DIP joint and fourth finger PIP joint space narrowing with bone-on-bone contact. Moderate to large index finger PIP degenerative osteophytes. Moderate to severe third DIP, moderate index finger PIP, and mild-to-moderate fourth and fifth DIP and fifth PIP joint space narrowing and peripheral osteophytosis. Severe third and moderate second, fourth, and fifth metacarpophalangeal joint space narrowing. Mild chondrocalcinosis within the triangular fibrocartilage complex. IMPRESSION: 1. Postsurgical changes of trapezium resection. 2. 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 3. Moderate to severe third and moderate second, fourth, and fifth metacarpophalangeal joint space narrowing and peripheral osteophytosis. This can be seen with osteoarthritis or rheumatoid arthritis. Electronically Signed   By: Yvonne Kendall M.D.   On: 04/27/2022 11:08        Scheduled Meds:  allopurinol  100 mg Oral Q supper   arformoterol  15 mcg Nebulization BID   And   umeclidinium bromide  1 puff Inhalation Daily   atorvastatin  80 mg Oral Q supper   ciprofloxacin  1 drop Both Eyes Q4H while awake   clopidogrel  75 mg Oral Daily   empagliflozin  10 mg Oral Daily   famotidine  20 mg Oral QHS   fluticasone  2 spray Each Nare Daily   folic acid  1 mg Oral Daily   furosemide  40 mg Oral Daily   guaiFENesin  600 mg Oral BID   isosorbide-hydrALAZINE  1 tablet Oral BID   montelukast  10 mg Oral QPC supper   polyvinyl alcohol  1 drop Both Eyes BID   predniSONE  7 mg Oral Q breakfast   rivaroxaban  20 mg Oral Q supper   sacubitril-valsartan  1 tablet Oral BID   sodium bicarbonate  650 mg Oral TID   Continuous Infusions:  azithromycin 500 mg (04/28/22 1958)   cefTRIAXone (ROCEPHIN)  IV 2 g (04/28/22 1222)   metronidazole 500 mg (04/29/22 0153)          Aline August, MD [facility] 04/29/2022, 8:06 AM  '

## 2022-04-29 NOTE — Progress Notes (Signed)
Notified by Telemetry monitor that patient had a 22 beat run of SVT, Dr Starla Link notified at [phone]. Stated will monitor patient, no new orders. Will continue to monitor.

## 2022-04-30 ENCOUNTER — Inpatient Hospital Stay (HOSPITAL_COMMUNITY): Payer: Medicare PPO

## 2022-04-30 DIAGNOSIS — K6289 Other specified diseases of anus and rectum: Secondary | ICD-10-CM | POA: Diagnosis not present

## 2022-04-30 DIAGNOSIS — J189 Pneumonia, unspecified organism: Secondary | ICD-10-CM | POA: Diagnosis not present

## 2022-04-30 LAB — BASIC METABOLIC PANEL
Anion gap: 9 (ref 5–15)
BUN: 19 mg/dL (ref 8–23)
CO2: 25 mmol/L (ref 22–32)
Calcium: 7.8 mg/dL — ABNORMAL LOW (ref 8.9–10.3)
Chloride: 108 mmol/L (ref 98–111)
Creatinine, Ser: 1.17 mg/dL — ABNORMAL HIGH (ref 0.44–1.00)
GFR, Estimated: 49 mL/min — ABNORMAL LOW (ref >60)
Glucose, Bld: 86 mg/dL (ref 70–99)
Potassium: 3 mmol/L — ABNORMAL LOW (ref 3.5–5.1)
Sodium: 142 mmol/L (ref 135–145)

## 2022-04-30 LAB — CULTURE, BLOOD (ROUTINE X 2)
Culture: NO GROWTH
Culture: NO GROWTH
Special Requests: ADEQUATE

## 2022-04-30 LAB — CBC WITH DIFFERENTIAL/PLATELET
Abs Immature Granulocytes: 0.1 10*3/uL — ABNORMAL HIGH (ref 0.00–0.07)
Basophils Absolute: 0.1 10*3/uL (ref 0.0–0.1)
Basophils Relative: 1 %
Eosinophils Absolute: 0.6 10*3/uL — ABNORMAL HIGH (ref 0.0–0.5)
Eosinophils Relative: 5 %
HCT: 32.1 % — ABNORMAL LOW (ref 36.0–46.0)
Hemoglobin: 10.4 g/dL — ABNORMAL LOW (ref 12.0–15.0)
Immature Granulocytes: 1 %
Lymphocytes Relative: 10 %
Lymphs Abs: 1.2 10*3/uL (ref 0.7–4.0)
MCH: 34.4 pg — ABNORMAL HIGH (ref 26.0–34.0)
MCHC: 32.4 g/dL (ref 30.0–36.0)
MCV: 106.3 fL — ABNORMAL HIGH (ref 80.0–100.0)
Monocytes Absolute: 1.5 10*3/uL — ABNORMAL HIGH (ref 0.1–1.0)
Monocytes Relative: 12 %
Neutro Abs: 9 10*3/uL — ABNORMAL HIGH (ref 1.7–7.7)
Neutrophils Relative %: 71 %
Platelets: 169 10*3/uL (ref 150–400)
RBC: 3.02 MIL/uL — ABNORMAL LOW (ref 3.87–5.11)
RDW: 15.4 % (ref 11.5–15.5)
WBC: 12.4 10*3/uL — ABNORMAL HIGH (ref 4.0–10.5)
nRBC: 0 % (ref 0.0–0.2)

## 2022-04-30 LAB — LEGIONELLA PNEUMOPHILA SEROGP 1 UR AG: L. pneumophila Serogp 1 Ur Ag: NEGATIVE

## 2022-04-30 LAB — MAGNESIUM: Magnesium: 2 mg/dL (ref 1.7–2.4)

## 2022-04-30 MED ORDER — KATE FARMS STANDARD 1.4 PO LIQD
325.0000 mL | Freq: Two times a day (BID) | ORAL | Status: DC
  Administered 2022-04-30 – 2022-05-01 (×2): 325 mL via ORAL
  Filled 2022-04-30 (×3): qty 325

## 2022-04-30 MED ORDER — METHYLPREDNISOLONE SODIUM SUCC 40 MG IJ SOLR
40.0000 mg | Freq: Every day | INTRAMUSCULAR | Status: DC
  Administered 2022-04-30 – 2022-05-02 (×3): 40 mg via INTRAVENOUS
  Filled 2022-04-30 (×3): qty 1

## 2022-04-30 MED ORDER — POTASSIUM CHLORIDE CRYS ER 20 MEQ PO TBCR
40.0000 meq | EXTENDED_RELEASE_TABLET | ORAL | Status: AC
  Administered 2022-04-30 (×2): 40 meq via ORAL
  Filled 2022-04-30 (×2): qty 2

## 2022-04-30 MED ORDER — METOCLOPRAMIDE HCL 5 MG/ML IJ SOLN: 10.0000 mg | Freq: Four times a day (QID) | INTRAMUSCULAR | Status: DC | PRN

## 2022-04-30 MED ORDER — METOCLOPRAMIDE HCL 5 MG/ML IJ SOLN
5.0000 mg | Freq: Four times a day (QID) | INTRAMUSCULAR | Status: DC | PRN
  Administered 2022-05-01: 5 mg via INTRAVENOUS
  Filled 2022-04-30: qty 2

## 2022-04-30 MED ORDER — ONDANSETRON HCL 4 MG/2ML IJ SOLN
4.0000 mg | Freq: Four times a day (QID) | INTRAMUSCULAR | Status: DC
  Administered 2022-04-30 – 2022-05-01 (×4): 4 mg via INTRAVENOUS
  Filled 2022-04-30 (×4): qty 2

## 2022-04-30 NOTE — Progress Notes (Signed)
PROGRESS NOTE    Ebony Scott  [id number] DOB: Oct 26, 1950 DOA: 04/25/2022 PCP: [name], MD   Brief Narrative:  72 y.o. 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  On presentation, she was febrile to 103 with bradycardia and dyspnea. CT of the head without contrast was negative for any acute intracranial abnormality. CT of abdomen and pelvis without contrast showed possible acute proctitis, possibility of underlying neoplastic process is not excluded; patchy infiltrates in both lower lobes suggestive of atelectasis/pneumonia. She was given IV fluids and antibiotics.   Assessment & Plan:   Sepsis: Present on admission Possible bilateral community-acquired pneumonia Acute respiratory failure with hypoxia Possible acute proctitis -Presented with altered mental status, fever, diarrhea, tachycardia, tachypnea, leukocytosis with pneumonia and possible proctitis -continue Rocephin, Zithromax and Flagyl.  Finish 7-day course of therapy but DC Flagyl today because of persistent nausea and decreased oral intake.  COVID-19/influenza/RSV PCR negative on presentation.  Blood cultures negative so far.  Urine streptococcal antigen negative.  Sputum cultures negative so far. -Currently on 3 L oxygen via nasal cannula.  Wean off as able.  Check portable chest x-ray today. -GI evaluation appreciated.  Continue conservative management.  Patient continues to have diarrhea.  Stool for C. difficile and GI PCR negative.  Use Imodium as needed.  Leukocytosis -Slightly improving to 12.4 today.  Repeat a.m. labs   Acute metabolic encephalopathy -Possibly from above.  CT of the brain negative for acute  intracranial abnormality.  Fall precautions. -Monitor mental status.  PT recommends SNF placement.  TOC consulted. -Still slow to respond but more awake. -Diet as per SLP recommendations.  Poor oral intake -Consult nutrition.  Will order 48-hour calorie count  Nausea -Questionable cause.  DC Flagyl and sodium bicarbonate.  Use Zofran around-the-clock for 24 hours and as needed Reglan   Chronic kidney disease stage IIIb Acute metabolic acidosis -Creatinine stable. Repeat a.m. labs. -Acidosis has resolved.  DC sodium bicarbonate  Chronic systolic heart failure Hypertension -Last echo on 05/26/2018 had shown EF of 30 to 35%.  Not fluid overloaded at this time.  Strict input and output Daily weights.  Fluid restriction.  Outpatient follow-up with cardiology. -Cardiology signed off on 04/28/2022: [name] has been started by cardiology on 04/27/2022.  BiDil and Wilder Glade resumed on 04/28/2022 and Lasix resumed from 04/29/2022.  Hypokalemia -Replace.  Repeat a.m. labs  Mildly positive troponins -Possible from sepsis/pneumonia.  No chest pain.  Troponins did not trend upward significantly.  I have reviewed the EKG done on presentation myself: No signs of ischemia.  No ST elevations or depressions. -No further workup is needed as per cardiology patient  Possible bilateral conjunctivitis -Continue antibiotic eyedrops.   Asthma Bronchiectasis -Continue current inhaled/nebulized regimen.  Outpatient follow-up with pulmonary. -Currently montelukast   History of DVT/PE -Continue Xarelto   History of seronegative rheumatoid arthritis -Outpatient follow-up with rheumatology.  Hold Plaquenil    Home dose of prednisone resumed from 04/27/2022 onwards.  Left upper extremity duplex ultrasound negative for DVT.  X-ray of left hand shows features of rheumatoid arthritis. -Will switch prednisone to IV Solu-Medrol for a day or 2 to see if patient responds in regards to pain and  nausea.  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     DVT prophylaxis: Xarelto Code Status: Full  Family Communication: Daughter at bedside disposition Plan: Status is: inpatient because: Of severity of illness.  Consultants: GI and cardiology  Procedures: None  Antimicrobials:  Anti-infectives (From admission, onward)    Start     Dose/Rate Route Frequency Ordered Stop   04/26/22 1200  cefTRIAXone (ROCEPHIN) 2 g in sodium chloride 0.9 % 100 mL IVPB        2 g 200 mL/hr over 30 Minutes Intravenous Every 24 hours 04/25/22 1454 05/01/22 1159   04/26/22 0000  metroNIDAZOLE (FLAGYL) IVPB 500 mg        500 mg 100 mL/hr over 60 Minutes Intravenous Every 12 hours 04/25/22 1454     04/25/22 2000  azithromycin (ZITHROMAX) 500 mg in sodium chloride 0.9 % 250 mL IVPB        500 mg 250 mL/hr over 60 Minutes Intravenous Every 24 hours 04/25/22 1454 04/29/22 2101   04/25/22 1245  metroNIDAZOLE (FLAGYL) IVPB 500 mg        500 mg 100 mL/hr over 60 Minutes Intravenous  Once 04/25/22 1237 04/25/22 1344   04/25/22 1215  vancomycin (VANCOREADY) IVPB 1500 mg/300 mL        1,500 mg 150 mL/hr over 120 Minutes Intravenous  Once 04/25/22 1200 04/25/22 1513   04/25/22 1200  ceFEPIme (MAXIPIME) 2 g in sodium chloride 0.9 % 100 mL IVPB        2 g 200 mL/hr over 30 Minutes Intravenous  Once 04/25/22 1157 04/25/22 1239        Subjective: Patient seen and examined at bedside.  Still slow to respond.  No fever, seizures or vomiting reported.  Complains of persistent nausea.  Also complains of right lower extremity pain.  Daughter at bedside reports very poor oral intake.  Objective: Vitals:   04/29/22 1957 04/29/22 2052 04/30/22 0449 04/30/22 0726  BP:  127/65 135/68   Pulse:  83 85   Resp:  18 18   Temp:  98.4 F (36.9 C) 98.7 F (37.1 C)   TempSrc:  Oral Oral   SpO2: 96% 97% 98% 97%  Weight:      Height:        Intake/Output Summary (Last 24 hours) at 04/30/2022 0839 Last  data filed at 04/30/2022 0456 Gross per 24 hour  Intake 240 ml  Output 1550 ml  Net -1310 ml    Filed Weights   04/25/22 1140  Weight: 62.1 kg    Examination:  General: No distress.  On 3 L oxygen currently via nasal cannula.  Looks chronically ill and deconditioned. Eyes: Bilateral conjunctival injection present but improving  ENT/neck: No obvious JVD elevation or palpable neck masses noted  respiratory: Bilateral decreased breath sounds at bases with crackles CVS: Rate mostly controlled; S1 and S2 are heard  abdominal: Soft, nontender, mildly distended; no organomegaly, normal bowel sounds are heard  extremities: Left wrist and hand swelling and mild tenderness present.  No cyanosis or clubbing  CNS: Awake, still slow to respond but answers questions appropriately.  No focal neurologic deficit.  Able to move extremities Lymph: No palpable lymphadenopathy noted skin: No obvious petechiae/rashes psych: Extremely flat affect.  Currently not agitated.  Data Reviewed: I have personally reviewed following labs and imaging studies  CBC: Recent Labs  Lab 04/25/22 1146 04/26/22 0556 04/27/22 0647 04/28/22 0706 04/29/22 0636 04/30/22 0459  WBC 17.8* 14.4* 15.6* 14.9* 13.2* 12.4*  NEUTROABS 13.0*  --  12.2* 12.2* 9.9* 9.0*  HGB 12.8 11.3* 12.3 10.7* 10.4* 10.4*  HCT 39.6 36.8 40.1 32.5* 31.7* 32.1*  MCV 108.5* 115.0* 111.7* 108.0* 106.4* 106.3*  PLT 182 135* 153 154 157 [id number]    Basic Metabolic Panel: Recent Labs  Lab 04/26/22 0556 04/27/22 0647 04/28/22 0706 04/29/22 0636 04/30/22 0459  NA 141 142 136 143 142  K 4.1 4.1 3.9 3.4* 3.0*  CL 112* 111 107 112* 108  CO2 15* 16* 23 21* 25  GLUCOSE 59* 140* 160* 81 86  BUN 28* '18 18 18 19  '$ CREATININE 1.71* 1.23* 1.03* 0.91 1.17*  CALCIUM 8.5* 9.0 8.0* 8.2* 7.8*  MG 2.0 1.7 1.9 1.8 2.0    GFR: Estimated Creatinine Clearance: 35.6 mL/min (A) (by C-G formula based on SCr of 1.17 mg/dL (H)). Liver Function Tests: Recent  Labs  Lab 04/25/22 1146 04/26/22 0556  AST 38 35  ALT 25 21  ALKPHOS 53 45  BILITOT 1.2 1.7*  PROT 7.6 6.4*  ALBUMIN 3.5 3.0*    No results for input(s): "LIPASE", "AMYLASE" in the last 168 hours. No results for input(s): "AMMONIA" in the last 168 hours. Coagulation Profile: Recent Labs  Lab 04/25/22 1146  INR 1.2    Cardiac Enzymes: No results for input(s): "CKTOTAL", "CKMB", "CKMBINDEX", "TROPONINI" in the last 168 hours. BNP (last 3 results) No results for input(s): "PROBNP" in the last 8760 hours. HbA1C: No results for input(s): "HGBA1C" in the last 72 hours. CBG: Recent Labs  Lab 04/25/22 1140  GLUCAP 102*    Lipid Profile: No results for input(s): "CHOL", "HDL", "LDLCALC", "TRIG", "CHOLHDL", "LDLDIRECT" in the last 72 hours. Thyroid Function Tests: No results for input(s): "TSH", "T4TOTAL", "FREET4", "T3FREE", "THYROIDAB" in the last 72 hours. Anemia Panel: No results for input(s): "VITAMINB12", "FOLATE", "FERRITIN", "TIBC", "IRON", "RETICCTPCT" in the last 72 hours. Sepsis Labs: Recent Labs  Lab 04/25/22 1146 04/25/22 1346  LATICACIDVEN 1.9 1.2     Recent Results (from the past 240 hour(s))  Culture, blood (routine x 2)     Status: None (Preliminary result)   Collection Time: 04/25/22 11:46 AM   Specimen: BLOOD  Result Value Ref Range Status   Specimen Description   Final    BLOOD RIGHT ANTECUBITAL Performed at [facility] [address], [place], [place] [address]    Special Requests   Final    BOTTLES DRAWN AEROBIC AND ANAEROBIC Blood Culture adequate volume Performed at [facility] [address] [place], [place] [address]    Culture   Final    NO GROWTH 4 DAYS Performed at [facility], [place] [address], [place], [place] [address]    Report Status PENDING  Incomplete  Resp panel by RT-PCR (RSV, Flu A&B, Covid) Anterior Nasal Swab     Status: None   Collection Time: 04/25/22 11:47 AM    Specimen: Anterior Nasal Swab  Result Value Ref Range Status   SARS Coronavirus 2 by RT PCR NEGATIVE NEGATIVE Final    Comment: (NOTE) SARS-CoV-2 target nucleic acids are NOT DETECTED.  The SARS-CoV-2 RNA is generally detectable in upper respiratory specimens during the acute phase of infection. The lowest concentration of SARS-CoV-2 viral copies this assay can detect is 138 copies/mL. A negative result does not preclude SARS-Cov-2 infection and should not be used as the sole basis for treatment or other patient management decisions. A negative result may occur with  improper specimen collection/handling, submission of specimen other than nasopharyngeal swab, presence of viral mutation(s) within the areas targeted by this assay, and inadequate  number of viral copies(<138 copies/mL). A negative result must be combined with clinical observations, patient history, and epidemiological information. The expected result is Negative.  Fact Sheet for Patients:  EntrepreneurPulse.com.au  Fact Sheet for Healthcare Providers:  IncredibleEmployment.be  This test is no t yet approved or cleared by the [place] FDA and  has been authorized for detection and/or diagnosis of SARS-CoV-2 by FDA under an Emergency Use Authorization (EUA). This EUA will remain  in effect (meaning this test can be used) for the duration of the COVID-19 declaration under Section 564(b)(1) of the Act, 21 U.S.C.section 360bbb-3(b)(1), unless the authorization is terminated  or revoked sooner.       Influenza A by PCR NEGATIVE NEGATIVE Final   Influenza B by PCR NEGATIVE NEGATIVE Final    Comment: (NOTE) The Xpert Xpress SARS-CoV-2/FLU/RSV plus assay is intended as an aid in the diagnosis of influenza from Nasopharyngeal swab specimens and should not be used as a sole basis for treatment. Nasal washings and aspirates are unacceptable for Xpert Xpress  SARS-CoV-2/FLU/RSV testing.  Fact Sheet for Patients: EntrepreneurPulse.com.au  Fact Sheet for Healthcare Providers: IncredibleEmployment.be  This test is not yet approved or cleared by the [place] FDA and has been authorized for detection and/or diagnosis of SARS-CoV-2 by FDA under an Emergency Use Authorization (EUA). This EUA will remain in effect (meaning this test can be used) for the duration of the COVID-19 declaration under Section 564(b)(1) of the Act, 21 U.S.C. section 360bbb-3(b)(1), unless the authorization is terminated or revoked.     Resp Syncytial Virus by PCR NEGATIVE NEGATIVE Final    Comment: (NOTE) Fact Sheet for Patients: EntrepreneurPulse.com.au  Fact Sheet for Healthcare Providers: IncredibleEmployment.be  This test is not yet approved or cleared by the [place] FDA and has been authorized for detection and/or diagnosis of SARS-CoV-2 by FDA under an Emergency Use Authorization (EUA). This EUA will remain in effect (meaning this test can be used) for the duration of the COVID-19 declaration under Section 564(b)(1) of the Act, 21 U.S.C. section 360bbb-3(b)(1), unless the authorization is terminated or revoked.  Performed at [facility], [place] [address] [place], [place] [address]   Culture, blood (routine x 2)     Status: None (Preliminary result)   Collection Time: 04/25/22 12:16 PM   Specimen: BLOOD RIGHT ARM  Result Value Ref Range Status   Specimen Description   Final    BLOOD RIGHT ARM Performed at [facility], [place] [address] [place],  [address]    Special Requests   Final    BOTTLES DRAWN AEROBIC AND ANAEROBIC Blood Culture results may not be optimal due to an excessive volume of blood received in culture bottles Performed at [facility] [address], [place], [place] [address]    Culture   Final    NO  GROWTH 4 DAYS Performed at [facility], [place] [address], [place], [name] [address]    Report Status PENDING  Incomplete  Expectorated Sputum Assessment w Gram Stain, Rflx to Resp Cult     Status: None   Collection Time: 04/25/22  9:13 PM   Specimen: Expectorated Sputum  Result Value Ref Range Status   Specimen Description EXPECTORATED SPUTUM  Final   Special Requests NONE  Final   Sputum evaluation   Final    THIS SPECIMEN IS ACCEPTABLE FOR SPUTUM CULTURE Performed at [facility], [place] [address] [place],  [address]  Report Status 04/25/2022 FINAL  Final  Culture, Respiratory w Gram Stain     Status: None   Collection Time: 04/25/22  9:13 PM  Result Value Ref Range Status   Specimen Description   Final    EXPECTORATED SPUTUM Performed at [facility], [place] [address] [place], [place] [address]    Special Requests   Final    NONE Reflexed from [id number] Performed at [facility], [place] [address], [place], [place] [address]    Gram Stain   Final    RARE WBC PRESENT, PREDOMINANTLY PMN RARE GRAM POSITIVE COCCI    Culture   Final    Normal respiratory flora-no Staph aureus or Pseudomonas seen Performed at [place] [address], [place], [place] [address]    Report Status 04/28/2022 FINAL  Final  Gastrointestinal Panel by PCR , Stool     Status: None   Collection Time: 04/27/22  8:05 AM   Specimen: STOOL  Result Value Ref Range Status   Campylobacter species NOT DETECTED NOT DETECTED Final   Plesimonas shigelloides NOT DETECTED NOT DETECTED Final   Salmonella species NOT DETECTED NOT DETECTED Final   Yersinia enterocolitica NOT DETECTED NOT DETECTED Final   Vibrio species NOT DETECTED NOT DETECTED Final   Vibrio cholerae NOT DETECTED NOT DETECTED Final   Enteroaggregative E coli (EAEC) NOT DETECTED NOT DETECTED Final   Enteropathogenic E coli (EPEC) NOT DETECTED NOT DETECTED Final    Enterotoxigenic E coli (ETEC) NOT DETECTED NOT DETECTED Final   Shiga like toxin producing E coli (STEC) NOT DETECTED NOT DETECTED Final   Shigella/Enteroinvasive E coli (EIEC) NOT DETECTED NOT DETECTED Final   Cryptosporidium NOT DETECTED NOT DETECTED Final   Cyclospora cayetanensis NOT DETECTED NOT DETECTED Final   Entamoeba histolytica NOT DETECTED NOT DETECTED Final   Giardia lamblia NOT DETECTED NOT DETECTED Final   Adenovirus F40/41 NOT DETECTED NOT DETECTED Final   Astrovirus NOT DETECTED NOT DETECTED Final   Norovirus GI/GII NOT DETECTED NOT DETECTED Final   Rotavirus A NOT DETECTED NOT DETECTED Final   Sapovirus (I, II, IV, and V) NOT DETECTED NOT DETECTED Final    Comment: Performed at [facility], [place] [place], [place] [address]  C Difficile Quick Screen w PCR reflex     Status: None   Collection Time: 04/27/22  8:05 AM   Specimen: STOOL  Result Value Ref Range Status   C Diff antigen NEGATIVE NEGATIVE Final   C Diff toxin NEGATIVE NEGATIVE Final   C Diff interpretation No C. difficile detected.  Final    Comment: Performed at [facility], [place] [address] [place], [place] [address]         Radiology Studies: No results found.      Scheduled Meds:  allopurinol  100 mg Oral Q supper   arformoterol  15 mcg Nebulization BID   And   umeclidinium bromide  1 puff Inhalation Daily   atorvastatin  80 mg Oral Q supper   ciprofloxacin  1 drop Both Eyes Q4H while awake   clopidogrel  75 mg Oral Daily   empagliflozin  10 mg Oral Daily   famotidine  20 mg Oral QHS   fluticasone  2 spray Each Nare Daily   folic acid  1 mg Oral Daily   furosemide  40 mg Oral Daily   guaiFENesin  600 mg Oral BID   isosorbide-hydrALAZINE  1 tablet Oral BID  montelukast  10 mg Oral QPC supper   polyvinyl alcohol  1 drop Both Eyes BID   predniSONE  7 mg Oral Q breakfast   rivaroxaban  20 mg Oral Q supper   sacubitril-valsartan  1 tablet Oral BID    sodium bicarbonate  650 mg Oral TID   Continuous Infusions:  cefTRIAXone (ROCEPHIN)  IV 2 g (04/29/22 1216)   metronidazole 500 mg (04/29/22 2322)          Aline August, MD [facility] 04/30/2022, 8:39 AM  '

## 2022-04-30 NOTE — Progress Notes (Signed)
Physical Therapy Treatment Patient Details Name: Ebony Scott MRN: [id number] DOB: [birth year] Today's Date: 04/30/2022   History of Present Illness 72 y.o. 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  difficult to stay aroused.  groggy.  feeling poorly.  In bed on 3 lts oxygen.  Assisted OOB was very difficult.  General bed mobility comments: pt very weak.  required max assist just to sit EOB then Mod Assist to maintain.  LOB all planes due to weakness and feeling poorly.  Pt remained on 3 lts with avg sats 92%  General transfer comment: pt required Max Total Assist to stand pivot sit from elevated bed to [facility].  VERY weak.  Poor posture.  Max c/o fatigue.  Did poorly.  Max Asisst + 2 again from [facility] to recliner.  Had pt stand at bed side holding elevated rail while assisting with peri acre after a BM and switching BSC with recliner from behind. General Gait Details: too weak/too sick to take any functional steps. Pt positioned to comfort in recliner.  Pt will need ST Rehab at SNF to address mobility and functional decline prior to safely returning home.   Recommendations for follow up therapy are one component of a multi-disciplinary discharge planning process, led by the attending physician.  Recommendations may be updated based on patient status, additional functional criteria and insurance  authorization.  Follow Up Recommendations  Skilled nursing-short term rehab (<3 hours/day) Can patient physically be transported by private vehicle: No   Assistance Recommended at Discharge Frequent or constant Supervision/Assistance  Patient can return home with the following Two people to help with walking and/or transfers;A lot of help with bathing/dressing/bathroom;Assist for transportation;Help with stairs or ramp for entrance   Equipment Recommendations  None recommended by PT    Recommendations for Other Services       Precautions / Restrictions Precautions Precautions: Fall Precaution Comments: monitor oxygen Restrictions Weight Bearing Restrictions: No     Mobility  Bed Mobility Overal bed mobility: Needs Assistance Bed Mobility: Supine to Sit     Supine to sit: Max assist, +2 for physical assistance, +2 for safety/equipment     General bed mobility comments: pt very weak.  required max assist just to sit EOB then Mod Assist to maintain.  LOB all planes due to weakness and feeling poorly.  Pt remained on 3 lts with avg sats 92%    Transfers Overall transfer level: Needs assistance Equipment used: None Transfers: Bed to chair/wheelchair/BSC   Stand pivot transfers: Max assist, Total assist, +2 physical assistance, +2 safety/equipment         General transfer comment: pt required Max Total Assist to stand pivot sit from elevated bed to [facility].  VERY weak.  Poor posture.  Max c/o fatigue.  Did poorly.  Max Asisst + 2 again from [facility] to recliner.  Had pt stand at bed side holding elevated rail while assisting with peri acre after a BM and switching BSC with recliner from behind.  Ambulation/Gait               General Gait Details: too weak/too sick to take any functional steps   Stairs             Wheelchair Mobility    Modified Rankin (Stroke Patients Only)       Balance                                             Cognition Arousal/Alertness: Lethargic   Overall Cognitive Status: No family/caregiver present to determine baseline cognitive functioning Area of Impairment: Orientation, Attention, Following commands                   Current Attention Level: Focused           General Comments: AxO x 2 sweet Lady, familiar from prior admits but looks very sickly.  difficult to stay aroused.  groggy.  feeling poorly.        Exercises      General Comments        Pertinent Vitals/Pain Pain Assessment Pain Assessment: No/denies pain Pain Descriptors / Indicators: Discomfort    Home Living                          Prior Function            PT Goals (current goals can now be found in the care plan section) Progress towards PT goals: Progressing toward goals    Frequency    Min 2X/week      PT Plan Current plan remains appropriate    Co-evaluation              AM-PAC PT "6 Clicks" Mobility   Outcome Measure  Help needed turning from your back to your side while in a flat bed without using bedrails?: Total Help needed moving from lying on your back to sitting on the side of a flat bed without using bedrails?: Total Help needed moving to and from a bed to a chair (including a wheelchair)?: Total Help needed standing up from a chair using your arms (e.g., wheelchair or bedside chair)?: Total Help needed to walk in hospital room?: Total Help needed climbing 3-5 steps with a railing? : Total 6 Click Score: 6    End of Session Equipment Utilized During Treatment: Gait belt Activity Tolerance: Patient limited by lethargy;Treatment limited secondary to medical complications (Comment) Patient left: in chair;with chair alarm set;with call bell/phone within reach Nurse Communication: Mobility status PT Visit Diagnosis: Unsteadiness on feet (R26.81);Difficulty in walking, not elsewhere classified (R26.2)     Time: 1435-1500 PT Time Calculation (min)  (ACUTE ONLY): 25 min  Charges:  $Therapeutic Activity: 23-37 mins                     Rica Koyanagi  PTA Acute  Rehabilitation Services Office M-F          [phone] Weekend pager [phone]

## 2022-04-30 NOTE — Progress Notes (Signed)
Initial Nutrition Assessment  INTERVENTION:   -48 hour Calorie Count per MD  -Anda [employer] 1.4 PO BID, each provides 455 kcals and 20g protein  NUTRITION DIAGNOSIS:   Inadequate oral intake related to lethargy/confusion as evidenced by meal completion < 25%.  GOAL:   Patient will meet greater than or equal to 90% of their needs  MONITOR:   PO intake, Supplement acceptance, Labs, Weight trends, I & O's  REASON FOR ASSESSMENT:   Consult Assessment of nutrition requirement/status, Calorie Count  ASSESSMENT:   72 y.o. 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 Admitted with community-acquired pneumonia.  Patient in room, no family at bedside. Pt very lethargic and unable to answer most of my questions without falling asleep.  Patient had refused breakfast this morning and states she doesn't want lunch. Unsure if safe to consume PO at this time.  Pt with lactose allergy so will order [employer] for additional kcals and protein.   Placed calorie count envelope on door. Calorie Count so far for 3/11: B: 0% L:0% D: pending  Medications reviewed.  Labs reviewed: Low K   NUTRITION - FOCUSED PHYSICAL EXAM:  Unable to complete at this time. Will attempt at follow-up.  Diet Order:   Diet Order             Diet regular Room service appropriate? Yes; Fluid consistency: Thin  Diet effective now                   EDUCATION NEEDS:   Not appropriate for education at this time  Skin:  Skin Assessment: Reviewed RN Assessment  Last BM:  3/8 -type 7  Height:   Ht Readings from Last 1 Encounters:  04/25/22 '5\' 1"'$  (1.549 m)    Weight:   Wt Readings from Last 1  Encounters:  04/25/22 62.1 kg    BMI:  Body mass index is 25.89 kg/m.  Estimated Nutritional Needs:   Kcal:  1550-1750  Protein:  70-85g  Fluid:  1.7L/day   Clayton Bibles, MS, RD, LDN Inpatient Clinical Dietitian Contact information available via Amion

## 2022-05-01 ENCOUNTER — Ambulatory Visit: Payer: Medicare PPO | Admitting: Hematology and Oncology

## 2022-05-01 ENCOUNTER — Other Ambulatory Visit: Payer: Medicare PPO

## 2022-05-01 DIAGNOSIS — K6289 Other specified diseases of anus and rectum: Secondary | ICD-10-CM | POA: Diagnosis not present

## 2022-05-01 DIAGNOSIS — J189 Pneumonia, unspecified organism: Secondary | ICD-10-CM | POA: Diagnosis not present

## 2022-05-01 LAB — COMPREHENSIVE METABOLIC PANEL
ALT: 21 U/L (ref 0–44)
AST: 34 U/L (ref 15–41)
Albumin: 2.5 g/dL — ABNORMAL LOW (ref 3.5–5.0)
Alkaline Phosphatase: 46 U/L (ref 38–126)
Anion gap: 12 (ref 5–15)
BUN: 28 mg/dL — ABNORMAL HIGH (ref 8–23)
CO2: 22 mmol/L (ref 22–32)
Calcium: 8.1 mg/dL — ABNORMAL LOW (ref 8.9–10.3)
Chloride: 107 mmol/L (ref 98–111)
Creatinine, Ser: 1.41 mg/dL — ABNORMAL HIGH (ref 0.44–1.00)
GFR, Estimated: 39 mL/min — ABNORMAL LOW (ref >60)
Glucose, Bld: 190 mg/dL — ABNORMAL HIGH (ref 70–99)
Potassium: 4 mmol/L (ref 3.5–5.1)
Sodium: 141 mmol/L (ref 135–145)
Total Bilirubin: 0.4 mg/dL (ref 0.3–1.2)
Total Protein: 6.5 g/dL (ref 6.5–8.1)

## 2022-05-01 LAB — CBC WITH DIFFERENTIAL/PLATELET
Abs Immature Granulocytes: 0.14 10*3/uL — ABNORMAL HIGH (ref 0.00–0.07)
Basophils Absolute: 0 10*3/uL (ref 0.0–0.1)
Basophils Relative: 0 %
Eosinophils Absolute: 0 10*3/uL (ref 0.0–0.5)
Eosinophils Relative: 0 %
HCT: 34.4 % — ABNORMAL LOW (ref 36.0–46.0)
Hemoglobin: 11.1 g/dL — ABNORMAL LOW (ref 12.0–15.0)
Immature Granulocytes: 1 %
Lymphocytes Relative: 5 %
Lymphs Abs: 0.5 10*3/uL — ABNORMAL LOW (ref 0.7–4.0)
MCH: 34.6 pg — ABNORMAL HIGH (ref 26.0–34.0)
MCHC: 32.3 g/dL (ref 30.0–36.0)
MCV: 107.2 fL — ABNORMAL HIGH (ref 80.0–100.0)
Monocytes Absolute: 1 10*3/uL (ref 0.1–1.0)
Monocytes Relative: 10 %
Neutro Abs: 9.1 10*3/uL — ABNORMAL HIGH (ref 1.7–7.7)
Neutrophils Relative %: 84 %
Platelets: 208 10*3/uL (ref 150–400)
RBC: 3.21 MIL/uL — ABNORMAL LOW (ref 3.87–5.11)
RDW: 15.6 % — ABNORMAL HIGH (ref 11.5–15.5)
WBC: 10.7 10*3/uL — ABNORMAL HIGH (ref 4.0–10.5)
nRBC: 0 % (ref 0.0–0.2)

## 2022-05-01 LAB — MAGNESIUM: Magnesium: 2.2 mg/dL (ref 1.7–2.4)

## 2022-05-01 MED ORDER — ADULT MULTIVITAMIN W/MINERALS CH
1.0000 | ORAL_TABLET | Freq: Every day | ORAL | Status: DC
  Administered 2022-05-01 – 2022-05-05 (×5): 1 via ORAL
  Filled 2022-05-01 (×5): qty 1

## 2022-05-01 MED ORDER — ONDANSETRON HCL 4 MG/2ML IJ SOLN: 4.0000 mg | Freq: Four times a day (QID) | INTRAMUSCULAR | Status: DC | PRN

## 2022-05-01 MED ORDER — BOOST / RESOURCE BREEZE PO LIQD CUSTOM
1.0000 | Freq: Three times a day (TID) | ORAL | Status: DC
  Administered 2022-05-01 – 2022-05-05 (×10): 1 via ORAL

## 2022-05-01 NOTE — Progress Notes (Addendum)
PROGRESS NOTE    Ebony Scott  [id number] DOB: 09/02/1950 DOA: 04/25/2022 PCP: [name], MD   Brief Narrative:  72 y.o. 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  On presentation, she was febrile to 103 with bradycardia and dyspnea. CT of the head without contrast was negative for any acute intracranial abnormality. CT of abdomen and pelvis without contrast showed possible acute proctitis, possibility of underlying neoplastic process is not excluded; patchy infiltrates in both lower lobes suggestive of atelectasis/pneumonia. She was given IV fluids and antibiotics.  GI and cardiology were consulted.  Subsequently signed off.  Assessment & Plan:   Sepsis: Present on admission Possible bilateral community-acquired pneumonia Acute respiratory failure with hypoxia Possible acute proctitis -Presented with altered mental status, fever, diarrhea, tachycardia, tachypnea, leukocytosis with pneumonia and possible proctitis -Received Zithromax for 5 days and Rocephin discontinued on 311 6:24 days.  DC'd Flagyl on 04/30/2022 because of persistent nausea and decreased oral intake.  COVID-19/influenza/RSV PCR negative on presentation.  Blood cultures negative so far.  Urine streptococcal antigen negative.  Sputum cultures negative so far. -Currently still on 3 L oxygen via nasal cannula.  Wean off as able.  Chest x-ray on 04/30/2022 showed findings consistent with congestive heart failure -GI evaluation appreciated.  Continue conservative management.  GI has already signed off.  Stool for C. difficile and GI PCR negative.  Diarrhea improving.  Use Imodium as needed.  Leukocytosis -Much improved  to 10.7 today.    Acute metabolic encephalopathy -Possibly from above.  CT of the brain negative for acute intracranial abnormality.  Fall precautions. -Monitor mental status.  PT recommends SNF placement.  TOC consulted. -Much more awake this morning.  Home gabapentin on hold. -Diet as per SLP recommendations.  Poor oral intake -Nutrition following.  48-hour calorie count started on 04/30/2022.  Nausea -Questionable cause.  DC'd Flagyl and sodium bicarbonate on 04/30/2022.  Currently on round-the-clock Zofran and as needed Reglan.  Nausea is improving.  Will switch to Zofran as needed.  Chronic kidney disease stage IIIb Acute metabolic acidosis -Creatinine slightly trended upwards to 1.41 today. Repeat a.m. labs. -Acidosis has resolved.  DC'd sodium bicarbonate  Chronic systolic heart failure Hypertension -Last echo on 05/25/2021 had shown EF of 30 to 35%.  Not fluid overloaded at this time.  Strict input and output Daily weights.  Fluid restriction.  Outpatient follow-up with cardiology. -Cardiology signed off on 04/28/2022: [name] has been started by cardiology on 04/27/2022.  BiDil and Wilder Glade resumed on 04/28/2022 and Lasix resumed from 04/29/2022.  Prolonged QT -EKG this morning showed QT of 499.  Monitor intermittently.  Avoid QT prolonging agents as much as possible.  Replace electrolytes as able.  Hypokalemia -Improved  Mildly positive troponins -Possible from sepsis/pneumonia.  No chest pain.  Troponins did not trend upward significantly.  I have reviewed the EKG done on presentation myself: No signs of ischemia.  No ST elevations or depressions. -No further workup is needed as per cardiology patient  Possible bilateral conjunctivitis -Continue antibiotic eyedrops.  Improving.   Asthma Bronchiectasis -Continue current inhaled/nebulized regimen.  Outpatient follow-up with pulmonary. -Currently montelukast   History of DVT/PE -Continue Xarelto   History of seronegative  rheumatoid arthritis -Outpatient follow-up with rheumatology.  Hold Plaquenil    Home dose of prednisone  resumed from 04/27/2022 onwards.  Left upper extremity duplex ultrasound negative for DVT.  X-ray of left hand shows features of rheumatoid arthritis. -Prednisone switched to IV Solu-Medrol on 04/30/2022 for a day or 2 to see if patient responds in regards to pain and nausea.  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     DVT prophylaxis: Xarelto Code Status: Full Family Communication: Daughter at bedside disposition Plan: Status is: inpatient because: Of severity of illness.  Consultants: GI and cardiology  Procedures: None  Antimicrobials:  Anti-infectives (From admission, onward)    Start     Dose/Rate Route Frequency Ordered Stop   04/26/22 1200  cefTRIAXone (ROCEPHIN) 2 g in sodium chloride 0.9 % 100 mL IVPB        2 g 200 mL/hr over 30 Minutes Intravenous Every 24 hours 04/25/22 1454 04/30/22 1323   04/26/22 0000  metroNIDAZOLE (FLAGYL) IVPB 500 mg  Status:  Discontinued        500 mg 100 mL/hr over 60 Minutes Intravenous Every 12 hours 04/25/22 1454 04/30/22 0925   04/25/22 2000  azithromycin (ZITHROMAX) 500 mg in sodium chloride 0.9 % 250 mL IVPB        500 mg 250 mL/hr over 60 Minutes Intravenous Every 24 hours 04/25/22 1454 04/29/22 2101   04/25/22 1245  metroNIDAZOLE (FLAGYL) IVPB 500 mg        500 mg 100 mL/hr over 60 Minutes Intravenous  Once 04/25/22 1237 04/25/22 1344   04/25/22 1215  vancomycin (VANCOREADY) IVPB 1500 mg/300 mL        1,500 mg 150 mL/hr over 120 Minutes Intravenous  Once 04/25/22 1200 04/25/22 1513   04/25/22 1200  ceFEPIme (MAXIPIME) 2 g in sodium chloride 0.9 % 100 mL IVPB        2 g 200 mL/hr over 30 Minutes Intravenous  Once 04/25/22 1157 04/25/22 1239        Subjective: Patient seen and examined at bedside.  Much more awake this morning and answering some questions.  No agitation, fever, vomiting or seizures reported.   Oral intake improving and nausea is improving.  Daughter present at bedside.   Objective: Vitals:   04/30/22 2050 04/30/22 2203 05/01/22 0515 05/01/22 0819  BP:  129/66 (!) 152/68   Pulse:  81 82   Resp:  16 20   Temp:  98.4 F (36.9 C) 97.6 F (36.4 C)   TempSrc:  Oral Oral   SpO2: 97% 94% 100% 95%  Weight:      Height:        Intake/Output Summary (Last 24 hours) at 05/01/2022 0825 Last data filed at 04/30/2022 1855 Gross per 24 hour  Intake --  Output 750 ml  Net -750 ml    Filed Weights   04/25/22 1140  Weight: 62.1 kg    Examination:  General: Still on 3 L oxygen via nasal cannula; no acute distress.  Looks chronically ill and deconditioned. Eyes: Bilateral conjunctival injection present but currently improving  ENT/neck: No palpable thyromegaly or JVD elevation noted  respiratory: Decreased breath sounds at bases with basilar crackles  CVS: S1-S2 heard; rate currently controlled abdominal: Soft, nontender, distended slightly; no organomegaly, bowel sounds normally heard  extremities: Left wrist and hand swelling and mild tenderness have much improved.  Bilateral lower extremity mild edema present CNS: Much more awake this morning and answers questions appropriately.  No focal neurologic deficit.  Moving extremities  lymph: No cervical lymphadenopathy palpable  skin: No obvious ecchymosis/lesions psych: Not  agitated.  Flat affect. Data Reviewed: I have personally reviewed following labs and imaging studies  CBC: Recent Labs  Lab 04/27/22 0647 04/28/22 0706 04/29/22 0636 04/30/22 0459 05/01/22 0540  WBC 15.6* 14.9* 13.2* 12.4* 10.7*  NEUTROABS 12.2* 12.2* 9.9* 9.0* 9.1*  HGB 12.3 10.7* 10.4* 10.4* 11.1*  HCT 40.1 32.5* 31.7* 32.1* 34.4*  MCV 111.7* 108.0* 106.4* 106.3* 107.2*  PLT 153 154 157 169 [id number]    Basic Metabolic Panel: Recent Labs  Lab 04/27/22 0647 04/28/22 0706 04/29/22 0636 04/30/22 0459 05/01/22 0540  NA 142 136 143 142 141  K 4.1 3.9  3.4* 3.0* 4.0  CL 111 107 112* 108 107  CO2 16* 23 21* 25 22  GLUCOSE 140* 160* 81 86 190*  BUN '18 18 18 19 '$ 28*  CREATININE 1.23* 1.03* 0.91 1.17* 1.41*  CALCIUM 9.0 8.0* 8.2* 7.8* 8.1*  MG 1.7 1.9 1.8 2.0 2.2    GFR: Estimated Creatinine Clearance: 29.6 mL/min (A) (by C-G formula based on SCr of 1.41 mg/dL (H)). Liver Function Tests: Recent Labs  Lab 04/25/22 1146 04/26/22 0556 05/01/22 0540  AST 38 35 34  ALT '25 21 21  '$ ALKPHOS 53 45 46  BILITOT 1.2 1.7* 0.4  PROT 7.6 6.4* 6.5  ALBUMIN 3.5 3.0* 2.5*    No results for input(s): "LIPASE", "AMYLASE" in the last 168 hours. No results for input(s): "AMMONIA" in the last 168 hours. Coagulation Profile: Recent Labs  Lab 04/25/22 1146  INR 1.2    Cardiac Enzymes: No results for input(s): "CKTOTAL", "CKMB", "CKMBINDEX", "TROPONINI" in the last 168 hours. BNP (last 3 results) No results for input(s): "PROBNP" in the last 8760 hours. HbA1C: No results for input(s): "HGBA1C" in the last 72 hours. CBG: Recent Labs  Lab 04/25/22 1140  GLUCAP 102*    Lipid Profile: No results for input(s): "CHOL", "HDL", "LDLCALC", "TRIG", "CHOLHDL", "LDLDIRECT" in the last 72 hours. Thyroid Function Tests: No results for input(s): "TSH", "T4TOTAL", "FREET4", "T3FREE", "THYROIDAB" in the last 72 hours. Anemia Panel: No results for input(s): "VITAMINB12", "FOLATE", "FERRITIN", "TIBC", "IRON", "RETICCTPCT" in the last 72 hours. Sepsis Labs: Recent Labs  Lab 04/25/22 1146 04/25/22 1346  LATICACIDVEN 1.9 1.2     Recent Results (from the past 240 hour(s))  Culture, blood (routine x 2)     Status: None   Collection Time: 04/25/22 11:46 AM   Specimen: BLOOD  Result Value Ref Range Status   Specimen Description   Final    BLOOD RIGHT ANTECUBITAL Performed at [facility] [address] [place], [place] [address]    Special Requests   Final    BOTTLES DRAWN AEROBIC AND ANAEROBIC Blood Culture adequate  volume Performed at [facility] [address], [place], [place] [address]    Culture   Final    NO GROWTH 5 DAYS Performed at [facility], [place] [address], [place], [place] [address]    Report Status 04/30/2022 FINAL  Final  Resp panel by RT-PCR (RSV, Flu A&B, Covid) Anterior Nasal Swab     Status: None   Collection Time: 04/25/22 11:47 AM   Specimen: Anterior Nasal Swab  Result Value Ref Range Status   SARS Coronavirus 2 by RT PCR NEGATIVE NEGATIVE Final    Comment: (NOTE) SARS-CoV-2 target nucleic acids are NOT DETECTED.  The SARS-CoV-2 RNA is generally detectable in upper respiratory specimens during the acute phase of infection. The lowest concentration of SARS-CoV-2 viral copies this assay can detect  is 138 copies/mL. A negative result does not preclude SARS-Cov-2 infection and should not be used as the sole basis for treatment or other patient management decisions. A negative result may occur with  improper specimen collection/handling, submission of specimen other than nasopharyngeal swab, presence of viral mutation(s) within the areas targeted by this assay, and inadequate number of viral copies(<138 copies/mL). A negative result must be combined with clinical observations, patient history, and epidemiological information. The expected result is Negative.  Fact Sheet for Patients:  EntrepreneurPulse.com.au  Fact Sheet for Healthcare Providers:  IncredibleEmployment.be  This test is no t yet approved or cleared by the [place] FDA and  has been authorized for detection and/or diagnosis of SARS-CoV-2 by FDA under an Emergency Use Authorization (EUA). This EUA will remain  in effect (meaning this test can be used) for the duration of the COVID-19 declaration under Section 564(b)(1) of the Act, 21 U.S.C.section 360bbb-3(b)(1), unless the authorization is terminated  or revoked sooner.       Influenza  A by PCR NEGATIVE NEGATIVE Final   Influenza B by PCR NEGATIVE NEGATIVE Final    Comment: (NOTE) The Xpert Xpress SARS-CoV-2/FLU/RSV plus assay is intended as an aid in the diagnosis of influenza from Nasopharyngeal swab specimens and should not be used as a sole basis for treatment. Nasal washings and aspirates are unacceptable for Xpert Xpress SARS-CoV-2/FLU/RSV testing.  Fact Sheet for Patients: EntrepreneurPulse.com.au  Fact Sheet for Healthcare Providers: IncredibleEmployment.be  This test is not yet approved or cleared by the [place] FDA and has been authorized for detection and/or diagnosis of SARS-CoV-2 by FDA under an Emergency Use Authorization (EUA). This EUA will remain in effect (meaning this test can be used) for the duration of the COVID-19 declaration under Section 564(b)(1) of the Act, 21 U.S.C. section 360bbb-3(b)(1), unless the authorization is terminated or revoked.     Resp Syncytial Virus by PCR NEGATIVE NEGATIVE Final    Comment: (NOTE) Fact Sheet for Patients: EntrepreneurPulse.com.au  Fact Sheet for Healthcare Providers: IncredibleEmployment.be  This test is not yet approved or cleared by the [place] FDA and has been authorized for detection and/or diagnosis of SARS-CoV-2 by FDA under an Emergency Use Authorization (EUA). This EUA will remain in effect (meaning this test can be used) for the duration of the COVID-19 declaration under Section 564(b)(1) of the Act, 21 U.S.C. section 360bbb-3(b)(1), unless the authorization is terminated or revoked.  Performed at [facility], [place] [address] [place], [place] [address]   Culture, blood (routine x 2)     Status: None   Collection Time: 04/25/22 12:16 PM   Specimen: BLOOD RIGHT ARM  Result Value Ref Range Status   Specimen Description   Final    BLOOD RIGHT ARM Performed at [facility],  [place] [address], [place], [place] [address]    Special Requests   Final    BOTTLES DRAWN AEROBIC AND ANAEROBIC Blood Culture results may not be optimal due to an excessive volume of blood received in culture bottles Performed at [facility] [address] [place], [place] [address]    Culture   Final    NO GROWTH 5 DAYS Performed at [facility], [place] [address], [place],  [address]    Report Status 04/30/2022 FINAL  Final  Expectorated Sputum Assessment w Gram Stain, Rflx to Resp Cult     Status: None   Collection Time: 04/25/22  9:13 PM   Specimen:  Expectorated Sputum  Result Value Ref Range Status   Specimen Description EXPECTORATED SPUTUM  Final   Special Requests NONE  Final   Sputum evaluation   Final    THIS SPECIMEN IS ACCEPTABLE FOR SPUTUM CULTURE Performed at [facility], [place] [address] [place], [place] [address]    Report Status 04/25/2022 FINAL  Final  Culture, Respiratory w Gram Stain     Status: None   Collection Time: 04/25/22  9:13 PM  Result Value Ref Range Status   Specimen Description   Final    EXPECTORATED SPUTUM Performed at [facility] [address], [place], [place] [address]    Special Requests   Final    NONE Reflexed from [phone] Performed at [facility], [place] [address], [place], [place] [address]    Gram Stain   Final    RARE WBC PRESENT, PREDOMINANTLY PMN RARE GRAM POSITIVE COCCI    Culture   Final    Normal respiratory flora-no Staph aureus or Pseudomonas seen Performed at [facility] [address], [place], [place] [address]    Report Status 04/28/2022 FINAL  Final  Gastrointestinal Panel by PCR , Stool     Status: None   Collection Time: 04/27/22  8:05 AM   Specimen: STOOL  Result Value Ref Range Status   Campylobacter species NOT DETECTED NOT DETECTED Final   Plesimonas shigelloides NOT DETECTED NOT DETECTED Final   Salmonella species NOT  DETECTED NOT DETECTED Final   Yersinia enterocolitica NOT DETECTED NOT DETECTED Final   Vibrio species NOT DETECTED NOT DETECTED Final   Vibrio cholerae NOT DETECTED NOT DETECTED Final   Enteroaggregative E coli (EAEC) NOT DETECTED NOT DETECTED Final   Enteropathogenic E coli (EPEC) NOT DETECTED NOT DETECTED Final   Enterotoxigenic E coli (ETEC) NOT DETECTED NOT DETECTED Final   Shiga like toxin producing E coli (STEC) NOT DETECTED NOT DETECTED Final   Shigella/Enteroinvasive E coli (EIEC) NOT DETECTED NOT DETECTED Final   Cryptosporidium NOT DETECTED NOT DETECTED Final   Cyclospora cayetanensis NOT DETECTED NOT DETECTED Final   Entamoeba histolytica NOT DETECTED NOT DETECTED Final   Giardia lamblia NOT DETECTED NOT DETECTED Final   Adenovirus F40/41 NOT DETECTED NOT DETECTED Final   Astrovirus NOT DETECTED NOT DETECTED Final   Norovirus GI/GII NOT DETECTED NOT DETECTED Final   Rotavirus A NOT DETECTED NOT DETECTED Final   Sapovirus (I, II, IV, and V) NOT DETECTED NOT DETECTED Final    Comment: Performed at [facility], [place] [place], [place] [address]  C Difficile Quick Screen w PCR reflex     Status: None   Collection Time: 04/27/22  8:05 AM   Specimen: STOOL  Result Value Ref Range Status   C Diff antigen NEGATIVE NEGATIVE Final   C Diff toxin NEGATIVE NEGATIVE Final   C Diff interpretation No C. difficile detected.  Final    Comment: Performed at [facility], [place] [address], [place], [place] [address]         Radiology Studies: DG CHEST PORT 1 VIEW  Result Date: 04/30/2022 CLINICAL DATA:  Shortness of breath EXAM: PORTABLE CHEST 1 VIEW COMPARISON:  04/25/2022 FINDINGS: Cardiomegaly. Pulmonary venous hypertension with mild interstitial edema. No evidence of consolidation, collapse or measurable effusion. Small amount of fluid in the minor fissure on the right. No acute bone finding. Previous right shoulder replacement and previous  cervical surgery. IMPRESSION: Cardiomegaly, pulmonary venous hypertension and  mild interstitial edema. Findings consistent with congestive heart failure. Electronically Signed   By: Nelson Chimes M.D.   On: 04/30/2022 10:08        Scheduled Meds:  allopurinol  100 mg Oral Q supper   arformoterol  15 mcg Nebulization BID   And   umeclidinium bromide  1 puff Inhalation Daily   atorvastatin  80 mg Oral Q supper   ciprofloxacin  1 drop Both Eyes Q4H while awake   clopidogrel  75 mg Oral Daily   empagliflozin  10 mg Oral Daily   famotidine  20 mg Oral QHS   feeding supplement ([employer] STANDARD 1.4)  325 mL Oral BID BM   fluticasone  2 spray Each Nare Daily   folic acid  1 mg Oral Daily   furosemide  40 mg Oral Daily   guaiFENesin  600 mg Oral BID   isosorbide-hydrALAZINE  1 tablet Oral BID   methylPREDNISolone (SOLU-MEDROL) injection  40 mg Intravenous Daily   montelukast  10 mg Oral QPC supper   ondansetron (ZOFRAN) IV  4 mg Intravenous Q6H   polyvinyl alcohol  1 drop Both Eyes BID   rivaroxaban  20 mg Oral Q supper   sacubitril-valsartan  1 tablet Oral BID   Continuous Infusions:          Aline August, MD [facility] 05/01/2022, 8:25 AM  '

## 2022-05-01 NOTE — TOC Progression Note (Addendum)
Transition of Care [facility]) - Progression Note    Patient Details  Name: Ebony Scott MRN: [id number] Date of Birth: November 11, 1950  Transition of Care [facility]) CM/SW [place], RN Phone Number:[phone]  05/01/2022, 2:12 PM  Clinical Narrative:    CM at bedside to discuss disposition plan. Patient states that she is now willing to consider SNF for short term rehab . Patient gives CM consent to initiate SNF bed search. CM has provided patient with medicare.gov SNF list. Will initiate SNF workup.   [id number] PASRR # [id number] A, FL2  completed. Info faxed out for bed offers.   Expected Discharge Plan: [place] Barriers to Discharge: Continued Medical Work up  Expected Discharge Plan and Services In-house Referral: NA Discharge Planning Services: CM Consult Post Acute Care Choice: NA Living arrangements for the past 2 months: Single Family Home                 DME Arranged: N/A DME Agency: NA       HH Arranged: NA [place] Agency: NA         Social Determinants of Health (SDOH) Interventions SDOH Screenings   Food Insecurity: No Food Insecurity (04/25/2022)  Housing: Low Risk  (04/25/2022)  Transportation Needs: No Transportation Needs (04/25/2022)  Utilities: Not At Risk (04/25/2022)  Alcohol Screen: Low Risk  (07/13/2021)  Depression (PHQ2-9): Low Risk  (07/13/2021)  Financial Resource Strain: Low Risk  (04/18/2022)  Physical Activity: Insufficiently Active (07/13/2021)  Social Connections: Moderately Isolated (07/13/2021)  Stress: No Stress Concern Present (07/13/2021)  Tobacco Use: Low Risk  (04/25/2022)    Readmission Risk Interventions    04/27/2022    2:05 PM 09/26/2020    3:55 PM  Readmission Risk Prevention Plan  Transportation Screening Complete Complete  PCP or Specialist Appt within 5-7 Days Complete   Home Care Screening Complete   Medication Review (RN CM) Referral to Pharmacy   Medication Review ([place])  Complete  PCP or Specialist  appointment within 3-5 days of discharge  Complete  HRI or [place]  Complete  SW Recovery Care/Counseling Consult  Complete  [place]  Not Applicable

## 2022-05-01 NOTE — NC FL2 (Signed)
[place] LEVEL OF CARE FORM     IDENTIFICATION  Patient Name: Ebony Scott Birthdate: 29-Apr-1950 Sex: female Admission Date (Current Location): 04/25/2022  [facility] and [place] Number:  Herbalist and Address:  [facility],  [address], [place]      Provider Number: [id number]  Attending Physician Name and Address:  Aline August, MD  Relative Name and Phone Number:  Annyka Kienbaum [phone]    Current Level of Care: Hospital Recommended Level of Care: [place] Prior Approval Number:    Date Approved/Denied:   PASRR Number: [id number]  Discharge Plan: SNF    Current Diagnoses: Patient Active Problem List   Diagnosis Date Noted   Abnormal CT of the abdomen 04/26/2022   Proctitis 04/26/2022   Effusion, left knee 08/09/2021   Noninfectious otitis externa of left ear 06/08/2021   Rash and other nonspecific skin eruption 04/28/2021   Iron deficiency anemia 02/24/2021   GAVE (gastric antral vascular ectasia)    Symptomatic anemia 02/04/2021   Anticoagulated    Antiplatelet or antithrombotic long-term use    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[place])  01/11/2021   Coronary artery calcification seen on CAT scan 01/11/2021   ARF (acute renal failure) ([place]) 10/04/2020   Physical deconditioning 09/25/2020   History of DVT (deep vein thrombosis) 09/24/2020   DMII (diabetes mellitus, type 2) ([place]) 09/23/2020   Cellulitis of right hand 09/22/2020   Abscess of dorsum of right hand 09/22/2020   Hoarseness Q000111Q   Metabolic encephalopathy [id number]   Acute encephalopathy    AMS (altered mental status) 02/23/2020   S/P reverse total shoulder arthroplasty, right    Hypokalemia    DOE (dyspnea on exertion) 02/18/2020   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   S/P reverse total shoulder arthroplasty, left 02/16/2020   Preoperative clearance 01/26/2020   Closed displaced fracture of metatarsal bone of  right foot 01/20/2019   History of pulmonary embolus (PE) 12/19/2018   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   Rheumatoid arthritis ([place])    Bilateral impacted cerumen 08/18/2018   Fungal otitis externa 08/18/2018   Cerumen debris on tympanic membrane of right ear 08/18/2018   Asthmatic bronchitis with exacerbation 07/29/2018   Pulmonary embolism ([place]) 07/29/2018   Cellulitis of forearm    Fever of unknown origin (FUO) 02/10/2018   Abdominal pain 02/10/2018   Fracture of base of fifth metatarsal bone with routine healing, left    Closed fracture of fifth metatarsal bone of right foot, initial encounter 01/15/2018   DVT (deep venous thrombosis) ([place]) 01/14/2018   Hypothyroidism 01/14/2018   Depression 01/14/2018   Debility    Malnutrition of moderate degree 12/17/2017   Knee pain    History of breast cancer    Chronic pain syndrome    Fibromyalgia    Graves disease    Vasculitis ([place]) 12/13/2017   Rhabdomyolysis 12/13/2017   Dysphonia 08/10/2017   Pulmonary nodules    Chronic kidney disease, stage 3b ([place]) 04/15/2016   Hx of adenomatous colonic polyps 04/12/2016   Cough 02/09/2016   Opacity of lung on imaging study 11/03/2015   Severe persistent asthma 02/08/2015   Facial pain syndrome 02/08/2015   Insomnia 02/08/2015   Other allergic rhinitis 02/08/2015   LPRD (laryngopharyngeal reflux disease) 02/08/2015   Acute on chronic combined systolic and diastolic CHF (congestive heart failure) ([place]) 02/08/2015   Benign paroxysmal positional vertigo 12/03/2012   Dizziness and giddiness 10/29/2012   Disequilibrium 10/29/2012   Pseudogout 02/24/2012  Community acquired pneumonia 05/31/2011   Irritable bowel syndrome 01/02/2010   Hyperlipidemia 12/23/2009   Hyperthyroidism 11/12/2006   Seasonal and perennial allergic rhinitis 11/12/2006   GERD 11/12/2006   NECK PAIN, CHRONIC 11/12/2006   OSTEOPOROSIS 11/12/2006   BREAST CANCER, HX OF 11/12/2006    Orientation RESPIRATION BLADDER  Height & Weight     Self, Time, Situation, Place  Normal Incontinent Weight: 62.1 kg Height:  '5\' 1"'$  (154.9 cm)  BEHAVIORAL SYMPTOMS/MOOD NEUROLOGICAL BOWEL NUTRITION STATUS     (n/a) Continent Diet  AMBULATORY STATUS COMMUNICATION OF NEEDS Skin   Limited Assist Verbally Other (Comment) (ecchymosis noted to bilateral eyes, rash noted to right breast,)                       Personal Care Assistance Level of Assistance  Bathing, Feeding, Dressing Bathing Assistance: Limited assistance Feeding assistance: Independent Dressing Assistance: Limited assistance     Functional Limitations Info  Sight, Hearing, Speech Sight Info: Adequate Hearing Info: Adequate Speech Info: Adequate    SPECIAL CARE FACTORS FREQUENCY  PT (By licensed PT), OT (By licensed OT)     PT Frequency: 5x/wk OT Frequency: 5x/wk            Contractures Contractures Info: Not present    Additional Factors Info  Code Status, Allergies, Psychotropic, Insulin Sliding Scale, Isolation Precautions, Suctioning Needs Code Status Info: Full Allergies Info: Baclofen, Dust Mite Extract, Molds & Smuts, Morphine And Related, Other, Penicillins, Rofecoxib, Shellfish Allergy, Shrimp Extract, Tetracycline Hcl, Tetracycline Hcl, Xolair (Omalizumab), Zoledronic Acid, Dilaudid (Hydromorphone Hcl), Hydrocodone-acetaminophen, Levofloxacin, Oxycodone Hcl, Oxycodone Hcl, Paroxetine, Celecoxib, Diltiazem Hcl, Lactose, Lactose Intolerance (Gi), Oxycodone-acetaminophen, Rituximab, Tree Extract, Gamunex (Immune Globulin), Penicillin G Procaine Psychotropic Info: see discharge summary Insulin Sliding Scale Info: see discharge summary Isolation Precautions Info: see discharge summary Suctioning Needs: n/a   Current Medications (05/01/2022):  This is the current hospital active medication list Current Facility-Administered Medications  Medication Dose Route Frequency Provider Last Rate Last Admin   acetaminophen (TYLENOL)  suppository 650 mg  650 mg Rectal Q4H PRN Aline August, MD   650 mg at 04/26/22 1334   acetaminophen (TYLENOL) tablet 650 mg  650 mg Oral Q6H PRN Aline August, MD   650 mg at 04/29/22 0820   allopurinol (ZYLOPRIM) tablet 100 mg  100 mg Oral Q supper Aline August, MD   100 mg at 04/30/22 1756   arformoterol (BROVANA) nebulizer solution 15 mcg  15 mcg Nebulization BID Aline August, MD   15 mcg at 05/01/22 0817   And   umeclidinium bromide (INCRUSE ELLIPTA) 62.5 MCG/ACT 1 puff  1 puff Inhalation Daily Aline August, MD   1 puff at 05/01/22 0819   atorvastatin (LIPITOR) tablet 80 mg  80 mg Oral Q supper Aline August, MD   80 mg at 04/30/22 1755   benzonatate (TESSALON) capsule 200 mg  200 mg Oral BID PRN Aline August, MD   200 mg at 04/28/22 1355   ciprofloxacin (CILOXAN) 0.3 % ophthalmic solution 1 drop  1 drop Both Eyes Q4H while awake Alekh, Kshitiz, MD   1 drop at 05/01/22 1300   clopidogrel (PLAVIX) tablet 75 mg  75 mg Oral Daily Alekh, Kshitiz, MD   75 mg at 05/01/22 0944   diclofenac Sodium (VOLTAREN) 1 % topical gel 1 Application  1 Application Topical QID PRN Aline August, MD   1 Application at [id number]   empagliflozin (JARDIANCE) tablet 10 mg  10 mg Oral Daily  Arnoldo Lenis, MD   10 mg at 05/01/22 0950   famotidine (PEPCID) tablet 20 mg  20 mg Oral QHS Aline August, MD   20 mg at 04/30/22 2205   feeding supplement (BOOST / RESOURCE BREEZE) liquid 1 Container  1 Container Oral TID BM Aline August, MD   1 Container at 05/01/22 1300   fluticasone (FLONASE) 50 MCG/ACT nasal spray 2 spray  2 spray Each Nare Daily Aline August, MD   2 spray at [id number]   folic acid (FOLVITE) tablet 1 mg  1 mg Oral Daily Starla Link, Kshitiz, MD   1 mg at 05/01/22 0943   furosemide (LASIX) tablet 40 mg  40 mg Oral Daily Arnoldo Lenis, MD   40 mg at 05/01/22 0944   guaiFENesin (MUCINEX) 12 hr tablet 600 mg  600 mg Oral BID Starla Link, Kshitiz, MD   600 mg at 05/01/22 0943    ipratropium-albuterol (DUONEB) 0.5-2.5 (3) MG/3ML nebulizer solution 3 mL  3 mL Nebulization Q6H PRN Alekh, Kshitiz, MD       isosorbide-hydrALAZINE (BIDIL) 20-37.5 MG per tablet 1 tablet  1 tablet Oral BID Arnoldo Lenis, MD   1 tablet at 05/01/22 0943   loperamide (IMODIUM) capsule 2 mg  2 mg Oral Q4H PRN Aline August, MD       loratadine (CLARITIN) tablet 10 mg  10 mg Oral Daily PRN Starla Link, Kshitiz, MD       methylPREDNISolone sodium succinate (SOLU-MEDROL) 40 mg/mL injection 40 mg  40 mg Intravenous Daily Starla Link, Kshitiz, MD   40 mg at 05/01/22 0943   montelukast (SINGULAIR) tablet 10 mg  10 mg Oral QPC supper Aline August, MD   10 mg at 04/30/22 1756   multivitamin with minerals tablet 1 tablet  1 tablet Oral Daily Aline August, MD   1 tablet at 05/01/22 1245   ondansetron (ZOFRAN) injection 4 mg  4 mg Intravenous Q6H PRN Aline August, MD       polyvinyl alcohol (LIQUIFILM TEARS) 1.4 % ophthalmic solution 1 drop  1 drop Both Eyes BID Starla Link, Kshitiz, MD   1 drop at 05/01/22 0948   rivaroxaban (XARELTO) tablet 20 mg  20 mg Oral Q supper Aline August, MD   20 mg at 04/30/22 1755   sacubitril-valsartan (ENTRESTO) 24-26 mg per tablet  1 tablet Oral BID Tobb, Kardie, DO   1 tablet at 05/01/22 0943   traMADol (ULTRAM) tablet 50 mg  50 mg Oral Q6H PRN Aline August, MD   50 mg at 05/01/22 0509   triamcinolone cream (KENALOG) 0.1 % cream   Topical PRN Aline August, MD   Given at 05/01/22 0950   Facility-Administered Medications Ordered in Other Encounters  Medication Dose Route Frequency Provider Last Rate Last Admin   iohexol (OMNIPAQUE) 350 MG/ML injection    PRN Leonie Man, MD   105 mL at 01/11/21 0955     Discharge Medications: Please see discharge summary for a list of discharge medications.  Relevant Imaging Results:  Relevant Lab Results:   Additional Information SSN: [id number]  Angelita Ingles, RN

## 2022-05-01 NOTE — Progress Notes (Addendum)
Nutrition Follow-up  DOCUMENTATION CODES:   Non-severe (moderate) malnutrition in context of chronic illness  INTERVENTION:   -48 hour Calorie Count per MD  -Boost Breeze po TID, each supplement provides 250 kcal and 9 grams of protein  -Multivitamin with minerals daily  -D/c Dillard [place] per patient request  NEW NUTRITION DIAGNOSIS:   Moderate Malnutrition related to chronic illness as evidenced by moderate fat depletion, mild muscle depletion.  GOAL:   Patient will meet greater than or equal to 90% of their needs  Starting to progress.  MONITOR:   PO intake, Supplement acceptance, Labs, Weight trends, I & O's  ASSESSMENT:   72 y.o. 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 Admitted with community-acquired pneumonia.  Patient more alert and awake today. States her appetite is improving. She started to have poor appetite on 3/5.  Patient reports usual intake is 2-3 meals a day. She may skip lunch or have a yogurt midday. For breakfast she tends to eat eggs with cheese, [place] bacon, and a biscuit with jelly and cream cheese. Pt drinks a lot of apple juice. Dinner is typically leftovers like lasagna. Denies issues with chewing or swallowing.  Pt tried the [employer] but didn't like it. Willing to try Boost Breeze supplements. Has Lactaid available to take for her lactose intolerance.  Calorie Count for 3/11: B: 0% L:0% D: not documented  Supplements: a few sips of Dillard [place]  3/12: B: 100% =400 kcals, 30g protein L:pending D:pending  Admission weight: 137 lbs Current weight: 129 lbs Per weight records, pt weighed ~141 lbs.  Medications: Pepcid, Folic  acid, Lasix, Reglan,  Labs reviewed.  NUTRITION - FOCUSED PHYSICAL EXAM:  Flowsheet Row Most Recent Value  Orbital Region Moderate depletion  [dark coloration]  Upper Arm Region Moderate depletion  Thoracic and Lumbar Region Moderate depletion  Buccal Region No depletion  Temple Region Mild depletion  Clavicle Bone Region Mild depletion  Clavicle and Acromion Bone Region Mild depletion  Scapular Bone Region Mild depletion  Dorsal Hand Mild depletion  Patellar Region Mild depletion  Anterior Thigh Region Mild depletion  Posterior Calf Region No depletion  Edema (RD Assessment) None  Hair Reviewed  Eyes Reviewed  Mouth Reviewed  Skin Reviewed  Nails Reviewed       Diet Order:   Diet Order             Diet regular Room service appropriate? Yes; Fluid consistency: Thin; Fluid restriction: 1500 mL Fluid  Diet effective now                   EDUCATION NEEDS:   Not appropriate for education at this time  Skin:  Skin Assessment: Reviewed RN Assessment  Last BM:  3/12 -type 4  Height:   Ht Readings from Last 1 Encounters:  04/25/22 '5\' 1"'$  (1.549 m)    Weight:   Wt Readings from Last 1 Encounters:  04/25/22 62.1 kg    BMI:  Body mass index is 25.89 kg/m.  Estimated Nutritional Needs:   Kcal:  1550-1750  Protein:  70-85g  Fluid:  1.7L/day   Clayton Bibles, MS, RD, LDN Inpatient Clinical Dietitian Contact information available via Amion

## 2022-05-02 DIAGNOSIS — J189 Pneumonia, unspecified organism: Secondary | ICD-10-CM | POA: Diagnosis not present

## 2022-05-02 DIAGNOSIS — A419 Sepsis, unspecified organism: Secondary | ICD-10-CM

## 2022-05-02 LAB — CBC WITH DIFFERENTIAL/PLATELET
Abs Immature Granulocytes: 0.17 10*3/uL — ABNORMAL HIGH (ref 0.00–0.07)
Basophils Absolute: 0 10*3/uL (ref 0.0–0.1)
Basophils Relative: 0 %
Eosinophils Absolute: 0 10*3/uL (ref 0.0–0.5)
Eosinophils Relative: 0 %
HCT: 31 % — ABNORMAL LOW (ref 36.0–46.0)
Hemoglobin: 10.2 g/dL — ABNORMAL LOW (ref 12.0–15.0)
Immature Granulocytes: 2 %
Lymphocytes Relative: 8 %
Lymphs Abs: 0.9 10*3/uL (ref 0.7–4.0)
MCH: 35.1 pg — ABNORMAL HIGH (ref 26.0–34.0)
MCHC: 32.9 g/dL (ref 30.0–36.0)
MCV: 106.5 fL — ABNORMAL HIGH (ref 80.0–100.0)
Monocytes Absolute: 1.4 10*3/uL — ABNORMAL HIGH (ref 0.1–1.0)
Monocytes Relative: 12 %
Neutro Abs: 9.2 10*3/uL — ABNORMAL HIGH (ref 1.7–7.7)
Neutrophils Relative %: 78 %
Platelets: 215 10*3/uL (ref 150–400)
RBC: 2.91 MIL/uL — ABNORMAL LOW (ref 3.87–5.11)
RDW: 15.7 % — ABNORMAL HIGH (ref 11.5–15.5)
WBC: 11.7 10*3/uL — ABNORMAL HIGH (ref 4.0–10.5)
nRBC: 0.3 % — ABNORMAL HIGH (ref 0.0–0.2)

## 2022-05-02 LAB — BASIC METABOLIC PANEL
Anion gap: 12 (ref 5–15)
BUN: 40 mg/dL — ABNORMAL HIGH (ref 8–23)
CO2: 23 mmol/L (ref 22–32)
Calcium: 8.2 mg/dL — ABNORMAL LOW (ref 8.9–10.3)
Chloride: 107 mmol/L (ref 98–111)
Creatinine, Ser: 1.05 mg/dL — ABNORMAL HIGH (ref 0.44–1.00)
GFR, Estimated: 56 mL/min — ABNORMAL LOW (ref >60)
Glucose, Bld: 105 mg/dL — ABNORMAL HIGH (ref 70–99)
Potassium: 3.9 mmol/L (ref 3.5–5.1)
Sodium: 142 mmol/L (ref 135–145)

## 2022-05-02 LAB — MAGNESIUM: Magnesium: 2.2 mg/dL (ref 1.7–2.4)

## 2022-05-02 MED ORDER — PREDNISONE 5 MG PO TABS
7.0000 mg | ORAL_TABLET | Freq: Every day | ORAL | Status: DC
  Administered 2022-05-03 – 2022-05-05 (×3): 7 mg via ORAL
  Filled 2022-05-02 (×3): qty 2

## 2022-05-02 NOTE — Progress Notes (Signed)
Calorie Count Note  48 hour calorie count ordered.  Diet: Regular Supplements: Boost Breeze po TID, each supplement provides 250 kcal and 9 grams of protein   3/12: Breakfast: 100% =400 kcals, 30g protein Lunch: not documented Dinner: not documented Supplements: Boost Breeze x 2 =500 kcals, 18g protein  Total intake: 900 kcal (58% of minimum estimated needs)  48g protein (68% of minimum estimated needs)  Nutrition Dx: Moderate Malnutrition related to chronic illness as evidenced by moderate fat depletion, mild muscle depletion.   Goal: Pt to meet >/= 90% of their estimated nutrition needs   Intervention:  -Boost Breeze po TID, each supplement provides 250 kcal and 9 grams of protein -Multivitamin with minerals daily -D/c Calorie count  Clayton Bibles, MS, RD, LDN Inpatient Clinical Dietitian Contact information available via Amion

## 2022-05-02 NOTE — Progress Notes (Signed)
Progress Note    Ebony Scott   [id number]  DOB: May 12, 1950  DOA: 04/25/2022     6 PCP: [name], MD  Initial CC: AMS, fever  Hospital Course: Ebony Scott is a 72 y.o. 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   On presentation, she was febrile to 103 with bradycardia and dyspnea. CT of the head without contrast was negative for any acute intracranial abnormality. CT of abdomen and pelvis without contrast showed possible acute proctitis, possibility of underlying neoplastic process is not excluded; patchy infiltrates in both lower lobes suggestive of atelectasis/pneumonia. She was given IV fluids and antibiotics.  GI and cardiology were consulted.  Subsequently signed off.   Interval History:  No events overnight.  Patient well-known to me from previous hospitalization in August 2022. She has improved greatly it appears since admission and now she's at the point of choosing rehab facilities.  She does ask for liberation in her fluid restriction as she feels dry mouth and wishes to drink more.   Assessment and Plan:  Sepsis: Present on admission Bilateral community-acquired pneumonia Acute respiratory failure with hypoxia Possible acute proctitis -Presented with altered mental status, fever, diarrhea, tachycardia, tachypnea, leukocytosis with pneumonia and possible proctitis -Received Zithromax for 5 days and Rocephin discontinued on 3/11.  DC'd Flagyl on 04/30/2022 because of persistent nausea and decreased oral intake.  COVID-19/influenza/RSV PCR negative on presentation.  Blood cultures negative so far.  Urine streptococcal antigen negative.  Sputum cultures negative so  far. - continue weaning O2 -GI evaluation appreciated.  Continue conservative management.  GI has already signed off.  Stool for C. difficile and GI PCR negative.  Diarrhea improving.  Use Imodium as needed.   Leukocytosis - overall stable and downtrended   Acute metabolic encephalopathy - resolved  -Possibly from above.  CT of the brain negative for acute intracranial abnormality.  Fall precautions. -Monitor mental status.  PT recommends SNF placement.  TOC consulted. - mentation now back to normal baseline    Poor oral intake -Nutrition following.  48-hour calorie count started on 04/30/2022.   Nausea -Questionable cause. DC'd Flagyl and sodium bicarbonate on 04/30/2022.  s/p scheduled zofran. Nausea is improving.  Will switch to Zofran as needed   Chronic kidney disease stage IIIb Acute metabolic acidosis - baseline creat 1.2 - now back to baseline    Chronic systolic heart failure Hypertension -Last echo on 05/25/2021 had shown EF of 30 to 35%.  Not fluid overloaded at this time.  Strict input and output Daily weights.  Fluid restriction.  Outpatient follow-up with cardiology. -Cardiology signed off on 04/28/2022: [name] has been started by cardiology on 04/27/2022.  BiDil and Wilder Glade resumed on 04/28/2022 and Lasix resumed from 04/29/2022.   Prolonged QT - continue monitoring electrolytes intermittently    Hypokalemia -Improved   Mildly positive troponins -Possible from sepsis/pneumonia.  No chest pain.  Troponins did not trend upward significantly. EKG negative for signs of ischemia on admission    Possible bilateral conjunctivitis -Continue antibiotic eyedrops.  Improving.   Asthma Bronchiectasis -Continue current inhaled/nebulized regimen.  Outpatient follow-up with pulmonary. -Currently montelukast   History of DVT/PE -Continue Xarelto   History of seronegative rheumatoid arthritis -Outpatient follow-up with rheumatology.  Hold Plaquenil    Home dose of prednisone  resumed from 04/27/2022 onwards.  Left upper extremity duplex ultrasound negative for DVT.  X-ray of left hand shows features of rheumatoid arthritis. -Prednisone switched to IV Solu-Medrol on 04/30/2022 for a day or 2 to see if patient responds in regards to pain and nausea.   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   Old records reviewed in assessment of this patient   DVT prophylaxis:   rivaroxaban (XARELTO) tablet 20 mg   Code Status:   Code Status: Full Code  Mobility Assessment (last 72 hours)     Mobility Assessment     Row Name 04/30/22 1643 04/30/22 0900 04/29/22 2018       Does patient have an order for bedrest or is patient medically unstable -- No - Continue assessment No - Continue assessment     What is the highest level of mobility based on the progressive mobility assessment? Level 3 (Stands with assist) - Balance while standing  and cannot march in place Level 3 (Stands with assist) - Balance while standing  and cannot march in place Level 3 (Stands with assist) - Balance while standing  and cannot march in place     Is the above level different from baseline mobility prior to current illness? -- Yes - Recommend PT order Yes - Recommend PT order              Barriers to discharge:  Disposition Plan:  SNF Status is: Inpt  Objective: Blood pressure (!) 146/63, pulse 83, temperature 97.9 F (36.6 C), temperature source Oral, resp. rate 18, height '5\' 1"'$  (1.549 m), weight 62.1 kg, SpO2 97 %.  Examination:  Physical Exam Constitutional:      General: She is not in acute distress.    Appearance: Normal appearance. She is not ill-appearing.  HENT:     Head: Normocephalic and atraumatic.     Mouth/Throat:     Mouth: Mucous membranes are moist.  Eyes:     Extraocular Movements: Extraocular movements intact.  Cardiovascular:     Rate and Rhythm: Normal rate and regular rhythm.  Pulmonary:     Effort: Pulmonary effort is normal. No respiratory  distress.     Breath sounds: Normal breath sounds. No wheezing.  Abdominal:     General: Bowel sounds are normal. There is no distension.     Palpations: Abdomen is soft.     Tenderness: There is no abdominal tenderness.  Musculoskeletal:        General: Normal range of motion.     Cervical back: Normal range of motion and neck supple.  Skin:    General: Skin is warm and dry.  Neurological:     General: No focal deficit present.     Mental Status: She is alert.  Psychiatric:        Mood and Affect: Mood normal.      Consultants:    Procedures:    Data Reviewed: Results for orders placed or performed during the hospital encounter of 04/25/22 (from the past 24 hour(s))  CBC with Differential/Platelet     Status: Abnormal   Collection Time: 05/02/22  5:42 AM  Result Value Ref Range   WBC 11.7 (H) 4.0 - 10.5 K/uL   RBC 2.91 (L) 3.87 - 5.11 MIL/uL   Hemoglobin 10.2 (L) 12.0 - 15.0 g/dL   HCT 31.0 (L) 36.0 - 46.0 %   MCV 106.5 (H) 80.0 - 100.0 fL   MCH 35.1 (H) 26.0 - 34.0 pg   MCHC 32.9  30.0 - 36.0 g/dL   RDW 15.7 (H) 11.5 - 15.5 %   Platelets 215 150 - 400 K/uL   nRBC 0.3 (H) 0.0 - 0.2 %   Neutrophils Relative % 78 %   Neutro Abs 9.2 (H) 1.7 - 7.7 K/uL   Lymphocytes Relative 8 %   Lymphs Abs 0.9 0.7 - 4.0 K/uL   Monocytes Relative 12 %   Monocytes Absolute 1.4 (H) 0.1 - 1.0 K/uL   Eosinophils Relative 0 %   Eosinophils Absolute 0.0 0.0 - 0.5 K/uL   Basophils Relative 0 %   Basophils Absolute 0.0 0.0 - 0.1 K/uL   Immature Granulocytes 2 %   Abs Immature Granulocytes 0.17 (H) 0.00 - 0.07 K/uL  Basic metabolic panel     Status: Abnormal   Collection Time: 05/02/22  5:42 AM  Result Value Ref Range   Sodium 142 135 - 145 mmol/L   Potassium 3.9 3.5 - 5.1 mmol/L   Chloride 107 98 - 111 mmol/L   CO2 23 22 - 32 mmol/L   Glucose, Bld 105 (H) 70 - 99 mg/dL   BUN 40 (H) 8 - 23 mg/dL   Creatinine, Ser 1.05 (H) 0.44 - 1.00 mg/dL   Calcium 8.2 (L) 8.9 - 10.3 mg/dL   GFR,  Estimated 56 (L) >60 mL/min   Anion gap 12 5 - 15  Magnesium     Status: None   Collection Time: 05/02/22  5:42 AM  Result Value Ref Range   Magnesium 2.2 1.7 - 2.4 mg/dL   *Note: Due to a large number of results and/or encounters for the requested time period, some results have not been displayed. A complete set of results can be found in Results Review.    I have reviewed pertinent nursing notes, vitals, labs, and images as necessary. I have ordered labwork to follow up on as indicated.  I have reviewed the last notes from staff over past 24 hours. I have discussed patient's care plan and test results with nursing staff, CM/SW, and other staff as appropriate.  Time spent: Greater than 50% of the 55 minute visit was spent in counseling/coordination of care for the patient as laid out in the A&P.   LOS: 6 days   Dwyane Dee, MD [facility] 05/02/2022, 4:03 PM

## 2022-05-02 NOTE — Hospital Course (Signed)
Ebony Scott is a 72 y.o. 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   On presentation, she was febrile to 103 with bradycardia and dyspnea. CT of the head without contrast was negative for any acute intracranial abnormality. CT of abdomen and pelvis without contrast showed possible acute proctitis, possibility of underlying neoplastic process is not excluded; patchy infiltrates in both lower lobes suggestive of atelectasis/pneumonia. She was given IV fluids and antibiotics.  GI and cardiology were consulted.  Subsequently signed off.

## 2022-05-03 DIAGNOSIS — M0600 Rheumatoid arthritis without rheumatoid factor, unspecified site: Secondary | ICD-10-CM | POA: Diagnosis not present

## 2022-05-03 DIAGNOSIS — R6520 Severe sepsis without septic shock: Secondary | ICD-10-CM

## 2022-05-03 DIAGNOSIS — A419 Sepsis, unspecified organism: Secondary | ICD-10-CM

## 2022-05-03 DIAGNOSIS — J189 Pneumonia, unspecified organism: Secondary | ICD-10-CM | POA: Diagnosis not present

## 2022-05-03 DIAGNOSIS — K6289 Other specified diseases of anus and rectum: Secondary | ICD-10-CM | POA: Diagnosis not present

## 2022-05-03 MED ORDER — HYDROXYCHLOROQUINE SULFATE 200 MG PO TABS
200.0000 mg | ORAL_TABLET | Freq: Every day | ORAL | Status: DC
  Administered 2022-05-03 – 2022-05-05 (×3): 200 mg via ORAL
  Filled 2022-05-03 (×3): qty 1

## 2022-05-03 MED ORDER — GABAPENTIN 300 MG PO CAPS
300.0000 mg | ORAL_CAPSULE | Freq: Every day | ORAL | Status: DC
  Administered 2022-05-03 – 2022-05-04 (×2): 300 mg via ORAL
  Filled 2022-05-03 (×2): qty 1

## 2022-05-03 NOTE — Progress Notes (Signed)
Progress Note    SORIYAH TRACH   [id number]  DOB: 12/27/1950  DOA: 04/25/2022     7 PCP: [name], MD  Initial CC: AMS, fever  Hospital Course: Ebony Scott is a 72 y.o. 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   On presentation, she was febrile to 103 with bradycardia and dyspnea. CT of the head without contrast was negative for any acute intracranial abnormality. CT of abdomen and pelvis without contrast showed possible acute proctitis, possibility of underlying neoplastic process is not excluded; patchy infiltrates in both lower lobes suggestive of atelectasis/pneumonia. She was given IV fluids and antibiotics.  GI and cardiology were consulted.  Subsequently signed off.   Interval History:  No events overnight.  Daughter present bedside this morning.  Patient endorsed fluid restriction change yesterday was adequate.  Having some left shoulder pain this morning which we discussed was likely in setting of her underlying arthritis.  She tolerates pain rather well in general.  She was asking about resuming gabapentin and Plaquenil.  Assessment and Plan:  Sepsis: Present on admission Bilateral community-acquired pneumonia Acute respiratory failure with hypoxia Possible acute proctitis -Presented with altered mental status, fever, diarrhea, tachycardia, tachypnea, leukocytosis with pneumonia and possible proctitis -Received Zithromax for 5 days and Rocephin discontinued on 3/11.  DC'd Flagyl on 04/30/2022 because of persistent nausea and decreased oral intake.  COVID-19/influenza/RSV PCR negative on presentation.  Blood cultures negative so far.  Urine streptococcal antigen  negative.  Sputum cultures negative so far. - continue weaning O2 -GI evaluation appreciated.  Continue conservative management.  GI has already signed off.  Stool for C. difficile and GI PCR negative.  Diarrhea improving.  Use Imodium as needed.   Leukocytosis - overall stable and downtrended   Acute metabolic encephalopathy - resolved  -Possibly from above.  CT of the brain negative for acute intracranial abnormality.  Fall precautions. -Monitor mental status.  PT recommends SNF placement.  TOC consulted. - mentation now back to normal baseline    Poor oral intake -Nutrition following.  48-hour calorie count started on 04/30/2022.   Nausea -Questionable cause. DC'd Flagyl and sodium bicarbonate on 04/30/2022.  s/p scheduled zofran. Nausea is improving.  Will switch to Zofran as needed   Chronic kidney disease stage IIIb Acute metabolic acidosis - baseline creat 1.2 - now back to baseline    Chronic systolic heart failure Hypertension -Last echo on 05/25/2021 had shown EF of 30 to 35%.  Not fluid overloaded at this time.  Strict input and output Daily weights.  Fluid restriction.  Outpatient follow-up with cardiology. -Cardiology signed off on 04/28/2022: [name] has been started by cardiology on 04/27/2022.  BiDil and Wilder Glade resumed on 04/28/2022 and Lasix resumed from 04/29/2022.   Prolonged QT - continue monitoring electrolytes intermittently    Hypokalemia -Improved   Mildly positive troponins -Possible from sepsis/pneumonia.  No chest pain.  Troponins did not trend upward significantly. EKG negative for signs of ischemia on admission    Possible bilateral conjunctivitis -Continue antibiotic eyedrops.  Improving.   Asthma Bronchiectasis -Continue current inhaled/nebulized regimen.  Outpatient follow-up with pulmonary. -Currently montelukast   History of DVT/PE -Continue Xarelto   History of seronegative rheumatoid arthritis -Outpatient follow-up with rheumatology.   Home  dose of prednisone resumed from  04/27/2022 onwards.  Left upper extremity duplex ultrasound negative for DVT.  X-ray of left hand shows features of rheumatoid arthritis. -Home prednisone resumed - Resume Plaquenil 05/03/2022   Hypogammaglobulinemia -On Hizentra as an outpatient.   Old records reviewed in assessment of this patient   DVT prophylaxis:   rivaroxaban (XARELTO) tablet 20 mg   Code Status:   Code Status: Full Code  Mobility Assessment (last 72 hours)     Mobility Assessment     Row Name 05/03/22 1602 04/30/22 1643         What is the highest level of mobility based on the progressive mobility assessment? Level 4 (Walks with assist in room) - Balance while marching in place and cannot step forward and back - Complete Level 3 (Stands with assist) - Balance while standing  and cannot march in place               Barriers to discharge:  Disposition Plan:  SNF Status is: Inpt  Objective: Blood pressure (!) 144/67, pulse 66, temperature 98.8 F (37.1 C), temperature source Oral, resp. rate 18, height '5\' 1"'$  (1.549 m), weight 62.1 kg, SpO2 96 %.  Examination:  Physical Exam Constitutional:      General: She is not in acute distress.    Appearance: Normal appearance. She is not ill-appearing.  HENT:     Head: Normocephalic and atraumatic.     Mouth/Throat:     Mouth: Mucous membranes are moist.  Eyes:     Extraocular Movements: Extraocular movements intact.  Cardiovascular:     Rate and Rhythm: Normal rate and regular rhythm.  Pulmonary:     Effort: Pulmonary effort is normal. No respiratory distress.     Breath sounds: Normal breath sounds. No wheezing.  Abdominal:     General: Bowel sounds are normal. There is no distension.     Palpations: Abdomen is soft.     Tenderness: There is no abdominal tenderness.  Musculoskeletal:        General: Normal range of motion.     Cervical back: Normal range of motion and neck supple.  Skin:    General: Skin is warm  and dry.  Neurological:     General: No focal deficit present.     Mental Status: She is alert.  Psychiatric:        Mood and Affect: Mood normal.      Consultants:    Procedures:    Data Reviewed: No results found. However, due to the size of the patient record, not all encounters were searched. Please check Results Review for a complete set of results.   I have reviewed pertinent nursing notes, vitals, labs, and images as necessary. I have ordered labwork to follow up on as indicated.  I have reviewed the last notes from staff over past 24 hours. I have discussed patient's care plan and test results with nursing staff, CM/SW, and other staff as appropriate.  Time spent: Greater than 50% of the 55 minute visit was spent in counseling/coordination of care for the patient as laid out in the A&P.   LOS: 7 days   Dwyane Dee, MD [facility] 05/03/2022, 4:18 PM

## 2022-05-03 NOTE — TOC Progression Note (Signed)
Transition of Care [facility]) - Progression Note    Patient Details  Name: [name] MRN: [id number] Date of Birth: 1950-05-11  Transition of Care [facility]) CM/SW [place], RN Phone [phone]  05/03/2022, 4:28 PM  Clinical Narrative:    Bed offers presenrted to patient. Insurance auth initiated. Auth ID [id number]   Expected Discharge Plan: Skilled Nursing Facility Barriers to Discharge: Continued Medical Work up  Expected Discharge Plan and Services In-house Referral: NA Discharge Planning Services: CM Consult Post Acute Care Choice: NA Living arrangements for the past 2 months: Single Family Home                 DME Arranged: N/A DME Agency: NA       HH Arranged: NA [place] Agency: NA         Social Determinants of Health (SDOH) Interventions SDOH Screenings   Food Insecurity: No Food Insecurity (04/25/2022)  Housing: Low Risk  (04/25/2022)  Transportation Needs: No Transportation Needs (04/25/2022)  Utilities: Not At Risk (04/25/2022)  Alcohol Screen: Low Risk  (07/13/2021)  Depression (PHQ2-9): Low Risk  (07/13/2021)  Financial Resource Strain: Low Risk  (04/18/2022)  Physical Activity: Insufficiently Active (07/13/2021)  Social Connections: Moderately Isolated (07/13/2021)  Stress: No Stress Concern Present (07/13/2021)  Tobacco Use: Low Risk  (04/25/2022)    Readmission Risk Interventions    04/27/2022    2:05 PM 09/26/2020    3:55 PM  Readmission Risk Prevention Plan  Transportation Screening Complete Complete  PCP or Specialist Appt within 5-7 Days Complete   Home Care Screening Complete   Medication Review (RN CM) Referral to Pharmacy   Medication Review ([place])  Complete  PCP or Specialist appointment within 3-5 days of discharge  Complete  HRI or [place]  Complete  SW Recovery Care/Counseling Consult  Complete  [place]  Not Applicable

## 2022-05-03 NOTE — Progress Notes (Signed)
Physical Therapy Treatment Patient Details Name: Ebony Scott MRN: [id number] DOB: 1950-07-21 Today's Date: 05/03/2022   History of Present Illness 72 y.o. 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  Feeling " alittle better" but still present with MAX c/o fatigue/weakness and limited activity tolerance. Assisted OOB to amb was difficult.  General bed mobility comments: slight improvement from prior session but still requiring MUCH assist to transfer to EOB with heavy use of rail and HOB elevated.  Very limited activity tolerance as pt had to take a break before attempting standing.  Required increased asisst back to bed.General transfer comment: pt required Mod Assist to rise from elevated with effort and c/o weakness.  "I got to sit down".  Increased RR and avg RA 96% with HR 89.  "I'm still so weak".  "Why?".  Educated pt on Sepsis and effects. General Gait Details: VERY limited amb distance due to MAX c/o weakness/fatigue.  Used Rollator.  Avg RA 96%.  Pt present with a "bad" LEFT knee.  "I need a knee replacement". Assisted back to bed. Pt will need ST Rehab at SNF to address mobility and functional decline prior to safely returning home.   Recommendations for follow up therapy are one component of a multi-disciplinary discharge planning process, led by the attending physician.  Recommendations may be updated  based on patient status, additional functional criteria and insurance authorization.  Follow Up Recommendations  Skilled nursing-short term rehab (<3 hours/day) Can patient physically be transported by private vehicle: No   Assistance Recommended at Discharge Frequent or constant Supervision/Assistance  Patient can return home with the following Two people to help with walking and/or transfers;A lot of help with bathing/dressing/bathroom;Assist for transportation;Help with stairs or ramp for entrance   Equipment Recommendations  None recommended by PT    Recommendations for Other Services       Precautions / Restrictions Precautions Precautions: Fall Precaution Comments: monitor oxygen Restrictions Weight Bearing Restrictions: No     Mobility  Bed Mobility Overal bed mobility: Needs Assistance Bed Mobility: Supine to Sit, Sit to Supine     Supine to sit: Mod assist Sit to supine: Max assist   General bed mobility comments: slight improvement from prior session but still requiring MUCH assist to transfer to EOB with heavy use of rail and HOB elevated.  Very limited activity tolerance as pt had to take a break before attempting standing.  Required increased asisst back to bed.    Transfers Overall transfer level: Needs assistance Equipment used: Rollator (4 wheels) Transfers: Sit to/from Stand Sit to Stand: Mod assist           General transfer comment: pt required Mod Assist to rise from elevated with effort and c/o weakness.  "I got to sit down".  Increased RR and avg RA 96% with HR 89.  "I'm still so weak".  "Why?".  Educated pt on Sepsis and effects.    Ambulation/Gait Ambulation/Gait assistance: Mod assist, Min assist Gait Distance (Feet): 6 Feet Assistive device: Rollator (4 wheels) Gait  Pattern/deviations: Step-to pattern, Decreased step length - left, Decreased step length - right, Shuffle, Decreased stance time - left, Knee flexed in stance - left Gait  velocity: decreased     General Gait Details: VERY limited amb distance due to MAX c/o weakness/fatigue.  Used Rollator.  Avg RA 96%.  Pt present with a "bad" LEFT knee.  "I need a knee replacement".   Stairs             Wheelchair Mobility    Modified Rankin (Stroke Patients Only)       Balance                                            Cognition Arousal/Alertness: Awake/alert Behavior During Therapy: WFL for tasks assessed/performed Overall Cognitive Status: Within Functional Limits for tasks assessed                                 General Comments: Much improved cognition and level of alertness.  Feeling " alittle better" but still present with MAX c/o fatigue/weakness and limited activity tolerance.        Exercises      General Comments        Pertinent Vitals/Pain Pain Assessment Pain Assessment: No/denies pain Pain Location: general Pain Descriptors / Indicators: Aching Pain Intervention(s): Monitored during session, Repositioned    Home Living                          Prior Function            PT Goals (current goals can now be found in the care plan section) Progress towards PT goals: Progressing toward goals    Frequency    Min 2X/week      PT Plan Current plan remains appropriate    Co-evaluation              AM-PAC PT "6 Clicks" Mobility   Outcome Measure  Help needed turning from your back to your side while in a flat bed without using bedrails?: A Lot Help needed moving from lying on your back to sitting on the side of a flat bed without using bedrails?: A Lot Help needed moving to and from a bed to a chair (including a wheelchair)?: A Lot Help needed standing up from a chair using your arms (e.g., wheelchair or bedside chair)?: A Lot Help needed to walk in hospital room?: A Lot Help needed climbing 3-5 steps with a railing? : Total 6 Click Score: 11    End of Session  Equipment Utilized During Treatment: Gait belt Activity Tolerance: Patient limited by fatigue Patient left: in bed;with call bell/phone within reach;with bed alarm set Nurse Communication: Mobility status PT Visit Diagnosis: Unsteadiness on feet (R26.81);Difficulty in walking, not elsewhere classified (R26.2)     Time: [id number] PT Time Calculation (min) (ACUTE ONLY): 27 min  Charges:  $Gait Training: 8-22 mins $Therapeutic Activity: 8-22 mins                     Rica Koyanagi  PTA Acute  Rehabilitation Services Office M-F          [phone] Weekend pager [phone]

## 2022-05-03 NOTE — Progress Notes (Signed)
Patient uses nocturnal CPAP at home with supplemental oxygen. She declines the use of CPAP at this time and states she has not been able to tolerate it for about the past week. Order placed for CPAP prn. Patient agrees to request a machine when she feels she would like to try it. Otherwise, RT will continue to follow for routine pulmonary care.

## 2022-05-04 ENCOUNTER — Other Ambulatory Visit: Payer: Self-pay

## 2022-05-04 DIAGNOSIS — K6289 Other specified diseases of anus and rectum: Secondary | ICD-10-CM | POA: Diagnosis not present

## 2022-05-04 DIAGNOSIS — J189 Pneumonia, unspecified organism: Secondary | ICD-10-CM | POA: Diagnosis not present

## 2022-05-04 DIAGNOSIS — R935 Abnormal findings on diagnostic imaging of other abdominal regions, including retroperitoneum: Secondary | ICD-10-CM | POA: Diagnosis not present

## 2022-05-04 DIAGNOSIS — R5381 Other malaise: Secondary | ICD-10-CM

## 2022-05-04 DIAGNOSIS — A419 Sepsis, unspecified organism: Secondary | ICD-10-CM | POA: Diagnosis not present

## 2022-05-04 LAB — GLUCOSE, CAPILLARY: Glucose-Capillary: 182 mg/dL — ABNORMAL HIGH (ref 70–99)

## 2022-05-04 MED ORDER — PROCHLORPERAZINE EDISYLATE 10 MG/2ML IJ SOLN
10.0000 mg | Freq: Four times a day (QID) | INTRAMUSCULAR | Status: DC | PRN
  Administered 2022-05-04: 10 mg via INTRAVENOUS
  Filled 2022-05-04: qty 2

## 2022-05-04 MED ORDER — HIZENTRA 1 GM/5ML ~~LOC~~ SOLN: 13.0000 g | SUBCUTANEOUS | Status: DC

## 2022-05-04 MED ORDER — SACUBITRIL-VALSARTAN 24-26 MG PO TABS: 1.0000 | ORAL_TABLET | Freq: Two times a day (BID) | ORAL | Status: DC

## 2022-05-04 MED ORDER — ONDANSETRON HCL 4 MG/2ML IJ SOLN: 4.0000 mg | Freq: Four times a day (QID) | INTRAMUSCULAR | Status: DC

## 2022-05-04 MED ORDER — TRAMADOL HCL 50 MG PO TABS: 50.0000 mg | ORAL_TABLET | Freq: Every day | ORAL | 0 refills | Status: DC

## 2022-05-04 MED ORDER — SODIUM CHLORIDE 0.9 % IV SOLN
12.5000 mg | Freq: Four times a day (QID) | INTRAVENOUS | Status: DC | PRN
  Filled 2022-05-04: qty 0.5

## 2022-05-04 MED ORDER — GABAPENTIN 300 MG PO CAPS: 300.0000 mg | ORAL_CAPSULE | Freq: Every day | ORAL | Status: DC

## 2022-05-04 NOTE — TOC Transition Note (Addendum)
Transition of Care [facility]) - CM/SW Discharge Note   Patient Details  Name: Ebony Scott MRN: [id number] Date of Birth: 01-09-1951  Transition of Care [facility]) CM/SW Contact:  Angelita Ingles, RN Phone Number:[phone]  05/04/2022, 11:58 AM   Clinical Narrative:    Discharge summary faxed to [facility] via hub. CM has spoken with Adela Lank at [facility] to verify that facility can accept patient. Adela Lank will notify CM when facility is ready for patient to come.   1312 CM received confirmation that patient is ok to discharge to [facility].Transportation arranged per [employer]. Cm attempted to contact daughter no answer voicemail left. Patient states daughter worked last night and is sleeping. Patient states that she will update daughter and does not wish for CM to call any other family member.  Please call report to  [place] CM has been notified by MD that patient vomited during transfer to stretcher to discharge. Discharge has been cancelled. She[place] with [facility] made aware. Adela Lank will check to see if patient can be accepted over weekend.     Final next level of care: Skilled Nursing Facility Barriers to Discharge: No Barriers Identified   Patient Goals and CMS Choice CMS Medicare.gov Compare Post Acute Care list provided to:: Patient (patient refused right now) Choice offered to / list presented to : Patient  Discharge Placement     Existing PASRR number confirmed : 05/04/22          Patient chooses bed at: [facility] Patient to be transferred to facility by: [place] Name of family member notified: Attemopted to contact daughter patient states that she is sleeping and that she will update her    Discharge Plan and Services Additional resources added to the After Visit Summary for   In-house Referral: NA Discharge Planning Services: CM Consult Post Acute Care Choice: NA          DME Arranged: N/A DME Agency: NA       HH Arranged: NA [place] Agency:  NA        Social Determinants of Health (SDOH) Interventions SDOH Screenings   Food Insecurity: No Food Insecurity (04/25/2022)  Housing: Low Risk  (04/25/2022)  Transportation Needs: No Transportation Needs (04/25/2022)  Utilities: Not At Risk (04/25/2022)  Alcohol Screen: Low Risk  (07/13/2021)  Depression (PHQ2-9): Low Risk  (07/13/2021)  Financial Resource Strain: Low Risk  (04/18/2022)  Physical Activity: Insufficiently Active (07/13/2021)  Social Connections: Moderately Isolated (07/13/2021)  Stress: No Stress Concern Present (07/13/2021)  Tobacco Use: Low Risk  (04/25/2022)     Readmission Risk Interventions    04/27/2022    2:05 PM 09/26/2020    3:55 PM  Readmission Risk Prevention Plan  Transportation Screening Complete Complete  PCP or Specialist Appt within 5-7 Days Complete   Home Care Screening Complete   Medication Review (RN CM) Referral to Pharmacy   Medication Review ([place])  Complete  PCP or Specialist appointment within 3-5 days of discharge  Complete  HRI or [place]  Complete  SW Recovery Care/Counseling Consult  Complete  [place]  Not Applicable

## 2022-05-04 NOTE — Progress Notes (Signed)
Nurse requested [place] for pt, who may need encouragement. During my visit, pt requested another blanket, which I provided. When I resumed our visit, she was on the phone with a family member, needs follow up to provide support.    05/04/22 1700  Spiritual Encounters  Type of Visit Initial  Care provided to: Patient  Referral source Nurse (RN/NT/LPN)  Reason for visit Routine spiritual support  OnCall Visit No  Spiritual Framework  Presenting Themes Impactful experiences and emotions  Community/Connection Family  Patient Stress Factors Health changes  Interventions  Spiritual Care Interventions Made Established relationship of care and support;Compassionate presence  Intervention Outcomes  Outcomes Connection to spiritual care

## 2022-05-04 NOTE — Progress Notes (Signed)
Report called to [place], Therapist, sports at [place].

## 2022-05-04 NOTE — Progress Notes (Signed)
Progress Note    Ebony Scott   [id number]  DOB: 09/13/1950  DOA: 04/25/2022     8 PCP: Ebony Frizzle, MD  Initial CC: AMS, fever  Hospital Course: [name] is a 72 y.o. 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   On presentation, she was febrile to 103 with bradycardia and dyspnea. CT of the head without contrast was negative for any acute intracranial abnormality. CT of abdomen and pelvis without contrast showed possible acute proctitis, possibility of underlying neoplastic process is not excluded; patchy infiltrates in both lower lobes suggestive of atelectasis/pneumonia. She was given IV fluids and antibiotics.  GI and cardiology were consulted.  Subsequently signed off.   Interval History:  No events overnight.  Was to be discharged today but then began vomiting once placed on stretcher.  Vitals and CBG stable. She was feeling well until spontaneously vomiting.  We will cancel discharge and observe overnight.   Assessment and Plan:  Sepsis: Present on admission Bilateral community-acquired pneumonia Acute respiratory failure with hypoxia Possible acute proctitis -Presented with altered mental status, fever, diarrhea, tachycardia, tachypnea, leukocytosis with pneumonia and possible proctitis -Received Zithromax for 5 days and Rocephin discontinued on 3/11.  DC'd Flagyl on 04/30/2022 because of persistent nausea and decreased oral intake.  COVID-19/influenza/RSV PCR negative on presentation.  Blood cultures negative so far.  Urine streptococcal antigen negative.  Sputum cultures negative so far. - continue weaning O2 -GI evaluation appreciated.  Continue conservative  management.  GI has already signed off.  Stool for C. difficile and GI PCR negative.  Diarrhea improving.  Use Imodium as needed.  N/V - intermittent issue during hospitalization  - responded well to scheduled zofran which was then changed to PRN - will place back on scheduled zofran given discharge postponed (check EKG first for Qtc)  Leukocytosis - overall stable and downtrended   Acute metabolic encephalopathy - resolved  -Possibly from above.  CT of the brain negative for acute intracranial abnormality.  Fall precautions. -Monitor mental status.  PT recommends SNF placement.  TOC consulted. - mentation now back to normal baseline    Poor oral intake -Nutrition following.  48-hour calorie count started on 04/30/2022.   Chronic kidney disease stage IIIb Acute metabolic acidosis - baseline creat 1.2 - now back to baseline    Chronic systolic heart failure Hypertension -Last echo on 05/25/2021 had shown EF of 30 to 35%.  Not fluid overloaded at this time.  Strict input and output Daily weights.  Fluid restriction.  Outpatient follow-up with cardiology. -Cardiology signed off on 04/28/2022: [name] has been started by cardiology on 04/27/2022.  BiDil and Wilder Glade resumed on 04/28/2022 and Lasix resumed from 04/29/2022.   Prolonged QT - continue monitoring electrolytes intermittently    Hypokalemia -Improved   Mildly positive troponins -Possible from sepsis/pneumonia.  No chest pain.  Troponins did not trend upward significantly. EKG negative for signs of ischemia on admission    Possible bilateral conjunctivitis -Continue antibiotic eyedrops.  Improving.   Asthma Bronchiectasis -Continue current inhaled/nebulized regimen.  Outpatient follow-up with pulmonary. -Currently montelukast   History of DVT/PE -Continue Xarelto   History of seronegative rheumatoid arthritis -Outpatient follow-up with rheumatology.   Home dose of prednisone resumed from 04/27/2022 onwards.  Left upper  extremity duplex ultrasound  negative for DVT.  X-ray of left hand shows features of rheumatoid arthritis. -Home prednisone resumed - Resume Plaquenil 05/03/2022   Hypogammaglobulinemia -On Hizentra as an outpatient.   Old records reviewed in assessment of this patient   DVT prophylaxis:   rivaroxaban (XARELTO) tablet 20 mg   Code Status:   Code Status: Full Code  Mobility Assessment (last 72 hours)     Mobility Assessment     Row Name 05/04/22 0930 05/03/22 1602 05/03/22 0800       Does patient have an order for bedrest or is patient medically unstable No - Continue assessment -- No - Continue assessment     What is the highest level of mobility based on the progressive mobility assessment? Level 4 (Walks with assist in room) - Balance while marching in place and cannot step forward and back - Complete Level 4 (Walks with assist in room) - Balance while marching in place and cannot step forward and back - Complete Level 4 (Walks with assist in room) - Balance while marching in place and cannot step forward and back - Complete     Is the above level different from baseline mobility prior to current illness? Yes - Recommend PT order -- Yes - Recommend PT order              Barriers to discharge:  Disposition Plan:  SNF Status is: Inpt  Objective: Blood pressure (!) 99/54, pulse 83, temperature 98.4 F (36.9 C), temperature source Oral, resp. rate 18, height 5\' 1"  (1.549 m), weight 62.1 kg, SpO2 93 %.  Examination:  Physical Exam Constitutional:      General: She is not in acute distress.    Appearance: Normal appearance. She is not ill-appearing.  HENT:     Head: Normocephalic and atraumatic.     Mouth/Throat:     Mouth: Mucous membranes are moist.  Eyes:     Extraocular Movements: Extraocular movements intact.  Cardiovascular:     Rate and Rhythm: Normal rate and regular rhythm.  Pulmonary:     Effort: Pulmonary effort is normal. No respiratory distress.      Breath sounds: Normal breath sounds. No wheezing.  Abdominal:     General: Bowel sounds are normal. There is no distension.     Palpations: Abdomen is soft.     Tenderness: There is no abdominal tenderness.  Musculoskeletal:        General: Normal range of motion.     Cervical back: Normal range of motion and neck supple.  Skin:    General: Skin is warm and dry.  Neurological:     General: No focal deficit present.     Mental Status: She is alert.  Psychiatric:        Mood and Affect: Mood normal.      Consultants:    Procedures:    Data Reviewed: Results for orders placed or performed during the hospital encounter of 04/25/22 (from the past 24 hour(s))  Glucose, capillary     Status: Abnormal   Collection Time: 05/04/22  2:34 PM  Result Value Ref Range   Glucose-Capillary 182 (H) 70 - 99 mg/dL   *Note: Due to a large number of results and/or encounters for the requested time period, some results have not been displayed. A complete set of results can be found in Results Review.     I have reviewed pertinent nursing notes, vitals, labs, and images as necessary. I have ordered labwork to follow up on  as indicated.  I have reviewed the last notes from staff over past 24 hours. I have discussed patient's care plan and test results with nursing staff, CM/SW, and other staff as appropriate.  Time spent: Greater than 50% of the 55 minute visit was spent in counseling/coordination of care for the patient as laid out in the A&P.   LOS: 8 days   Dwyane Dee, MD [facility] 05/04/2022, 2:57 PM

## 2022-05-04 NOTE — Discharge Summary (Incomplete Revision)
Physician Discharge Summary   Ebony Scott [id number] DOB: 28-Jun-1950 DOA: 04/25/2022  PCP: [name], MD  Admit date: 04/25/2022 Discharge date:  05/04/2022  Admitted From: Home Disposition:  [facility] Discharging physician: Dwyane Dee, MD Barriers to discharge: none  Recommendations for Outpatient Follow-up:  Follow up with cardiology   Discharge Condition: stable CODE STATUS: Full Diet recommendation:  Diet Orders (From admission, onward)     Start     Ordered   05/04/22 0000  Diet general        05/04/22 1126   05/02/22 1118  Diet regular Room service appropriate? Yes; Fluid consistency: Thin; Fluid restriction: 2000 mL Fluid  Diet effective now       Question Answer Comment  Room service appropriate? Yes   Fluid consistency: Thin   Fluid restriction: 2000 mL Fluid      05/02/22 1117            Hospital Course: Ebony Scott is a 72 y.o. 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   On presentation, she was febrile to 103 with bradycardia and dyspnea. CT of the head without contrast was negative for any acute intracranial abnormality. CT of abdomen and pelvis without contrast showed possible acute proctitis, possibility of underlying neoplastic process is not excluded; patchy infiltrates in both lower lobes suggestive of atelectasis/pneumonia. She was given IV fluids and antibiotics.  GI and cardiology were consulted.  Subsequently signed off.   Assessment and Plan:  Sepsis due to CAP, POA - resolved  Acute respiratory failure with hypoxia - resolved  Acute proctitis - resolved  -Presented with altered mental status, fever, diarrhea, tachycardia,  tachypnea, leukocytosis with pneumonia and possible proctitis -Received Zithromax for 5 days and Rocephin discontinued on 3/11.  DC'd Flagyl on 04/30/2022 because of persistent nausea and decreased oral intake.  COVID-19/influenza/RSV PCR negative on presentation.  Blood cultures negative so far.  Urine streptococcal antigen negative.  Sputum cultures negative so far. - continue weaning O2 -GI evaluation appreciated.  Continue conservative management.  GI has already signed off.  Stool for C. difficile and GI PCR negative.  Diarrhea improving.  Use Imodium as needed.   Leukocytosis - overall stable and downtrended   Acute metabolic encephalopathy - resolved  -Possibly from above.  CT of the brain negative for acute intracranial abnormality.  Fall precautions. -Monitor mental status.  PT recommends SNF placement.  TOC consulted. - mentation now back to normal baseline    Acute on Chronic systolic heart failure Hypertension -Last echo on 05/25/2021 had shown EF of 30 to 35%.  Not fluid overloaded at this time.  Strict input and output Daily weights.  Fluid restriction.  Outpatient follow-up with cardiology. -Cardiology signed off on 04/28/2022: Ebony Scott has been started by cardiology on 04/27/2022.  BiDil and Wilder Glade resumed on 04/28/2022 and Lasix resumed from 04/29/2022.   Nausea -Questionable cause. DC'd Flagyl and sodium bicarbonate on 04/30/2022.  s/p scheduled zofran. Nausea is improving.  Will switch to Zofran as needed   Chronic kidney disease stage IIIb Acute metabolic acidosis - baseline creat 1.2 - now back to baseline    Prolonged QT - continue monitoring electrolytes intermittently    Hypokalemia -Improved   Mildly positive troponins -Possible from sepsis/pneumonia.  No chest pain.  Troponins did  not trend upward significantly. EKG negative for signs of ischemia on admission    Bilateral conjunctivitis - resolved  - s/p eye drops   Asthma Bronchiectasis -Continue current  inhaled/nebulized regimen.  Outpatient follow-up with pulmonary. -Currently montelukast   History of DVT/PE -Continue Xarelto   History of seronegative rheumatoid arthritis -Outpatient follow-up with rheumatology.   Home dose of prednisone resumed from 04/27/2022 onwards.  Left upper extremity duplex ultrasound negative for DVT.  X-ray of left hand shows features of rheumatoid arthritis. -Home prednisone resumed - Resume Plaquenil 05/03/2022   Hypogammaglobulinemia -On Hizentra as an outpatient.   Principal Diagnosis: Community acquired pneumonia  Discharge Diagnoses: Active Hospital Problems   Diagnosis Date Noted   Physical deconditioning 09/25/2020    Priority: 2.   Acute on chronic combined systolic and diastolic CHF (congestive heart failure) ([place]) 02/08/2015    Priority: 3.   Abnormal CT of the abdomen 04/26/2022   DMII (diabetes mellitus, type 2) ([place]) 09/23/2020   Rheumatoid arthritis ([place])    Depression 01/14/2018   Protein-calorie malnutrition, moderate ([place]) 12/17/2017   Chronic kidney disease, stage 3b ([place]) 04/15/2016   Hyperlipidemia 12/23/2009    Resolved Hospital Problems   Diagnosis Date Noted Date Resolved   Community acquired pneumonia 05/31/2011 05/02/2022    Priority: 1.   Severe sepsis [facility]) 05/02/2022 05/02/2022    Priority: 1.   Proctitis 04/26/2022 05/04/2022    Priority: 2.     Discharge Instructions     Diet general   Complete by: As directed    Increase activity slowly   Complete by: As directed       Allergies as of 05/04/2022       Reactions   Baclofen Anaphylaxis, Other (See Comments)   Altered mental status requiring a 3-day hospital stay- patient became unresponsive "Put me in a coma for five days", Altered mental status requiring a 3-day hospital stay- patient became unresponsive   Dust Mite Extract Shortness Of Breath, Other (See Comments)   "sneezing" (02/20/2012) too   Molds & Smuts Shortness Of Breath   Morphine And Related  Hives, Itching   Other Shortness Of Breath, Other (See Comments)   Grass and weeds "sneezing; filled sinuses" (02/20/2012)   Penicillins Rash, Other (See Comments)   "welts" (02/20/2012) Tolerated Ancef 02/16/20   Rofecoxib Swelling, Other (See Comments)   Vioxx- feet swelling   Shellfish Allergy Anaphylaxis, Shortness Of Breath, Itching, Rash   Shrimp Extract Anaphylaxis, Other (See Comments)   ALL SHELLFISH   Tetracycline Hcl Nausea And Vomiting   Tetracycline Hcl Other (See Comments)   GI Intolerance   Xolair [omalizumab] Other (See Comments)   Caused Blood clot   Zoledronic Acid Other (See Comments)   Reclast- Fever, Put in hospital, dr said it was a reaction from a reaction  Fever, inability to walk, Put in hospital Fever, Put in hospital, dr said it was a reaction from a reaction , Other reaction(s): Unknown   Dilaudid [hydromorphone Hcl] Itching   Hydrocodone-acetaminophen Itching   Levofloxacin Other (See Comments)   GI upset Other Reaction(s): GI Intolerance, Other (See Comments) REACTION: GI upset, GI upset   Oxycodone Hcl Nausea And Vomiting   Oxycodone Hcl Other (See Comments)   GI Intolerance   Paroxetine Nausea And Vomiting, Other (See Comments)   GI Intolerance also   Celecoxib Swelling, Other (See Comments)   Feet swelling   Diltiazem Hcl Swelling   Lactose Other (See Comments)   Bloating and gas   Lactose  Intolerance (gi) Other (See Comments)   Bloating and gas   Oxycodone-acetaminophen Itching   Rituximab Other (See Comments)   Anemia    Tree Extract Other (See Comments)   "tested and told I was allergic to it; never experienced a reaction to it" (02/20/2012)   Gamunex [immune Globulin] Itching, Rash   Tolerates hizentra    Penicillin G Procaine Rash, Other (See Comments)   Welts, also        Medication List     STOP taking these medications    budesonide 1 MG/2ML nebulizer solution Commonly known as: PULMICORT   DermOtic 0.01 % Oil Generic  drug: Fluocinolone Acetonide   metroNIDAZOLE 250 MG tablet Commonly known as: FLAGYL   valsartan 40 MG tablet Commonly known as: Diovan       TAKE these medications    acetaminophen 650 MG CR tablet Commonly known as: TYLENOL Take 1,300 mg by mouth daily as needed for pain.   acetic acid-hydrocortisone OTIC solution Commonly known as: VOSOL-HC Place 4 drops into both ears See admin instructions. Instill 4 drops into both ears the first 5 days of the month, then as directed/as needed for itching   albuterol 108 (90 Base) MCG/ACT inhaler Commonly known as: VENTOLIN HFA Inhale 2 puffs into the lungs every 6 (six) hours as needed for wheezing or shortness of breath.   ALIGN PO Take 1 capsule by mouth daily.   allopurinol 100 MG tablet Commonly known as: ZYLOPRIM TAKE 1 TABLET BY MOUTH DAILY What changed: when to take this   Alpha-Lipoic Acid 600 MG Tabs Take 600 mg by mouth daily.   ammonium lactate 12 % lotion Commonly known as: Amlactin Daily Apply 1 Application topically as needed for dry skin.   atorvastatin 80 MG tablet Commonly known as: LIPITOR TAKE 1 TABLET BY MOUTH DAILY What changed: when to take this   azelastine 0.1 % nasal spray Commonly known as: ASTELIN Place 2 sprays into both nostrils daily. Use in each nostril as directed   BEANO PO Take 2 tablets by mouth 3 (three) times daily before meals.   benzonatate 200 MG capsule Commonly known as: TESSALON TAKE 1 CAPSULE BY MOUTH THREE TIMES DAILY AS NEEDED FOR COUGH What changed: See the new instructions.   Biofreeze 4 % Gel Generic drug: Menthol (Topical Analgesic) Apply 1 application  topically daily as needed (for shoulder or joint pain).   CALCIUM MAGNESIUM PO Take 1 tablet by mouth at bedtime.   cetirizine 10 MG tablet Commonly known as: ZYRTEC Take 1 tablet (10 mg total) by mouth 2 (two) times daily as needed for allergies (Can take a n extra dose during flare ups.). What changed: when to  take this   clopidogrel 75 MG tablet Commonly known as: PLAVIX Take 1 tablet (75 mg total) by mouth daily.   cyproheptadine 4 MG tablet Commonly known as: PERIACTIN Take 2 tablets (8 mg total) by mouth at bedtime. Take 2 tablets at bedtime as needed to treat facial pain or sleep dysfunction. What changed:  when to take this additional instructions   denosumab 60 MG/ML Sosy injection Commonly known as: PROLIA Inject 60 mg into the skin every 6 (six) months.   dexlansoprazole 60 MG capsule Commonly known as: Dexilant Take 1 capsule (60 mg total) by mouth daily.   diclofenac Sodium 1 % Gel Commonly known as: VOLTAREN Apply 1 Application topically 4 (four) times daily as needed (pain).   empagliflozin 10 MG Tabs tablet Commonly known as:  Jardiance Take 1 tablet (10 mg total) by mouth daily before breakfast.   EPINEPHrine 0.3 mg/0.3 mL Soaj injection Commonly known as: EPI-PEN USE AS DIRECTED BY YOUR PHYSICIAN INTRAMUSCULARLY AS NEEDED FOR ANAPHYLAXIS What changed:  how much to take how to take this when to take this reasons to take this additional instructions   famotidine 20 MG tablet Commonly known as: PEPCID TAKE 1 TABLET BY MOUTH EACH NIGHT AT BEDTIME What changed: See the new instructions.   fluticasone 50 MCG/ACT nasal spray Commonly known as: FLONASE USE 2 SPRAYS IN EACH NOSTRIL DAILY   folic acid 1 MG tablet Commonly known as: FOLVITE Take 1 tablet (1 mg total) by mouth daily.   furosemide 40 MG tablet Commonly known as: LASIX Take 1 tablet (40 mg total) by mouth daily. What changed: when to take this   gabapentin 300 MG capsule Commonly known as: NEURONTIN Take 1 capsule (300 mg total) by mouth at bedtime.   Gerhardt's butt cream Crea Apply topically 3 times a day as needed for itching or irritation   guaiFENesin 600 MG 12 hr tablet Commonly known as: MUCINEX Take 1 tablet (600 mg total) by mouth 2 (two) times daily as needed for cough or to  loosen phlegm. What changed: when to take this   Hizentra 1 GM/5ML Soln Generic drug: Immune Globulin (Human) 13 g by Subcutaneous Infusion route See admin instructions. On hold while at rehab. Hizentra- 13 g infused every Friday  ("3 times/5 ml = 65 ml total once weekly") Start taking on: June 04, 2022 What changed:  additional instructions These instructions start on June 04, 2022. If you are unsure what to do until then, ask your doctor or other care provider.   hydroxychloroquine 200 MG tablet Commonly known as: PLAQUENIL Take 1 tablet (200 mg total) by mouth daily.   ipratropium-albuterol 0.5-2.5 (3) MG/3ML Soln Commonly known as: DUONEB Take 3 mLs by nebulization every 6 (six) hours as needed. What changed: reasons to take this   isosorbide-hydrALAZINE 20-37.5 MG tablet Commonly known as: BIDIL Take 1 tablet by mouth 2 (two) times daily. What changed: when to take this   LACTOSE FAST ACTING RELIEF PO Take 3 tablets by mouth 3 (three) times daily before meals.   lidocaine-prilocaine cream Commonly known as: EMLA Apply 1 application  topically as needed (pain).   magnesium oxide 400 (240 Mg) MG tablet Commonly known as: MAG-OX Take 400 mg by mouth at bedtime.   mirtazapine 30 MG disintegrating tablet Commonly known as: REMERON SOL-TAB Take 1 tablet (30 mg total) by mouth at bedtime as needed (sleep). What changed: when to take this   montelukast 10 MG tablet Commonly known as: SINGULAIR Take 1 tablet (10 mg total) by mouth daily. What changed: when to take this   nystatin 100000 UNIT/ML suspension Commonly known as: MYCOSTATIN Use as directed 15 mLs in the mouth or throat at bedtime.   potassium chloride 10 MEQ tablet Commonly known as: KLOR-CON M Take 1 tablet (10 mEq total) by mouth daily. What changed: when to take this   predniSONE 5 MG tablet Commonly known as: DELTASONE Take 1 tablet (5 mg total) by mouth daily with breakfast. Along with 2 mg  tablets for total daily dose 7 mg total daily What changed: Another medication with the same name was changed. Make sure you understand how and when to take each.   predniSONE 1 MG tablet Commonly known as: DELTASONE Take 2 tablets (2 mg total) by mouth  daily. Total of 7 mg daily. What changed: when to take this   PRENATAL VITAMINS PO Take 1 tablet by mouth daily.   sacubitril-valsartan 24-26 MG Commonly known as: ENTRESTO Take 1 tablet by mouth 2 (two) times daily.   Stiolto Respimat 2.5-2.5 MCG/ACT Aers Generic drug: Tiotropium Bromide-Olodaterol Inhale 2 puffs into the lungs daily. What changed: when to take this   Systane Complete 0.6 % Soln Generic drug: Propylene Glycol Place 1 drop into both eyes 2 (two) times daily.   traMADol 50 MG tablet Commonly known as: ULTRAM Take 1 tablet (50 mg total) by mouth at bedtime.   Vitamin D3 25 MCG (1000 UT) capsule Generic drug: Cholecalciferol Take 1,000 Units by mouth at bedtime.   Xarelto 20 MG Tabs tablet Generic drug: rivaroxaban TAKE 1 TABLET BY MOUTH DAILY WITH SUPPER What changed: how much to take        Contact information for after-discharge care     Destination     [facility] Preferred SNF .   Service: Skilled Nursing Contact information: [id number] [address] [phone]                    Allergies  Allergen Reactions   Baclofen Anaphylaxis and Other (See Comments)    Altered mental status requiring a 3-day hospital stay- patient became unresponsive   "Put me in a coma for five days", Altered mental status requiring a 3-day hospital stay- patient became unresponsive   Dust Mite Extract Shortness Of Breath and Other (See Comments)    "sneezing" (02/20/2012) too   Molds & Smuts Shortness Of Breath   Morphine And Related Hives and Itching   Other Shortness Of Breath and Other (See Comments)    Grass and weeds "sneezing; filled sinuses"  (02/20/2012)   Penicillins Rash and Other (See Comments)    "welts" (02/20/2012)  Tolerated Ancef 02/16/20   Rofecoxib Swelling and Other (See Comments)    Vioxx- feet swelling   Shellfish Allergy Anaphylaxis, Shortness Of Breath, Itching and Rash   Shrimp Extract Anaphylaxis and Other (See Comments)    ALL SHELLFISH   Tetracycline Hcl Nausea And Vomiting   Tetracycline Hcl Other (See Comments)    GI Intolerance   Xolair [Omalizumab] Other (See Comments)    Caused Blood clot   Zoledronic Acid Other (See Comments)    Reclast- Fever, Put in hospital, dr said it was a reaction from a reaction    Fever, inability to walk, Put in hospital  Fever, Put in hospital, dr said it was a reaction from a reaction , Other reaction(s): Unknown   Dilaudid [Hydromorphone Hcl] Itching   Hydrocodone-Acetaminophen Itching   Levofloxacin Other (See Comments)    GI upset  Other Reaction(s): GI Intolerance, Other (See Comments)  REACTION: GI upset, GI upset   Oxycodone Hcl Nausea And Vomiting   Oxycodone Hcl Other (See Comments)    GI Intolerance   Paroxetine Nausea And Vomiting and Other (See Comments)    GI Intolerance also   Celecoxib Swelling and Other (See Comments)    Feet swelling   Diltiazem Hcl Swelling   Lactose Other (See Comments)    Bloating and gas   Lactose Intolerance (Gi) Other (See Comments)    Bloating and gas   Oxycodone-Acetaminophen Itching   Rituximab Other (See Comments)    Anemia    Tree Extract Other (See Comments)    "tested and told I was allergic to  it; never experienced a reaction to it" (02/20/2012)   Gamunex [Immune Globulin] Itching and Rash    Tolerates hizentra    Penicillin G Procaine Rash and Other (See Comments)    Welts, also    Consultations: Cardiology  Procedures:   Discharge Exam: BP (!) 99/54 (BP Location: Right Arm)   Pulse 83   Temp 98.4 F (36.9 C) (Oral)   Resp 18   Ht 5\' 1"  (1.549 m)   Wt 62.1 kg   SpO2 93%   BMI 25.89 kg/m   Physical Exam Constitutional:      General: She is not in acute distress.    Appearance: Normal appearance. She is not ill-appearing.  HENT:     Head: Normocephalic and atraumatic.     Mouth/Throat:     Mouth: Mucous membranes are moist.  Eyes:     Extraocular Movements: Extraocular movements intact.  Cardiovascular:     Rate and Rhythm: Normal rate and regular rhythm.  Pulmonary:     Effort: Pulmonary effort is normal. No respiratory distress.     Breath sounds: Normal breath sounds. No wheezing.  Abdominal:     General: Bowel sounds are normal. There is no distension.     Palpations: Abdomen is soft.     Tenderness: There is no abdominal tenderness.  Musculoskeletal:        General: Normal range of motion.     Cervical back: Normal range of motion and neck supple.  Skin:    General: Skin is warm and dry.  Neurological:     General: No focal deficit present.     Mental Status: She is alert.  Psychiatric:        Mood and Affect: Mood normal.      The results of significant diagnostics from this hospitalization (including imaging, microbiology, ancillary and laboratory) are listed below for reference.   Microbiology: Recent Results (from the past 240 hour(s))  Culture, blood (routine x 2)     Status: None   Collection Time: 04/25/22 11:46 AM   Specimen: BLOOD  Result Value Ref Range Status   Specimen Description   Final    BLOOD RIGHT ANTECUBITAL Performed at [facility] [address] [place], [place] [address]    Special Requests   Final    BOTTLES DRAWN AEROBIC AND ANAEROBIC Blood Culture adequate volume Performed at [facility] [address] [place], [place] [address]    Culture   Final    NO GROWTH 5 DAYS Performed at [facility], [place] [address], [place],  [address]    Report Status 04/30/2022 FINAL  Final  Resp panel by RT-PCR (RSV, Flu A&B, Covid) Anterior Nasal Swab     Status: None   Collection  Time: 04/25/22 11:47 AM   Specimen: Anterior Nasal Swab  Result Value Ref Range Status   SARS Coronavirus 2 by RT PCR NEGATIVE NEGATIVE Final    Comment: (NOTE) SARS-CoV-2 target nucleic acids are NOT DETECTED.  The SARS-CoV-2 RNA is generally detectable in upper respiratory specimens during the acute phase of infection. The lowest concentration of SARS-CoV-2 viral copies this assay can detect is 138 copies/mL. A negative result does not preclude SARS-Cov-2 infection and should not be used as the sole basis for treatment or other patient management decisions. A negative result may occur with  improper specimen collection/handling, submission of specimen other than nasopharyngeal swab, presence of viral mutation(s) within the areas targeted by this assay,  and inadequate number of viral copies(<138 copies/mL). A negative result must be combined with clinical observations, patient history, and epidemiological information. The expected result is Negative.  Fact Sheet for Patients:  EntrepreneurPulse.com.au  Fact Sheet for Healthcare Providers:  IncredibleEmployment.be  This test is no t yet approved or cleared by the [place] FDA and  has been authorized for detection and/or diagnosis of SARS-CoV-2 by FDA under an Emergency Use Authorization (EUA). This EUA will remain  in effect (meaning this test can be used) for the duration of the COVID-19 declaration under Section 564(b)(1) of the Act, 21 U.S.C.section 360bbb-3(b)(1), unless the authorization is terminated  or revoked sooner.       Influenza A by PCR NEGATIVE NEGATIVE Final   Influenza B by PCR NEGATIVE NEGATIVE Final    Comment: (NOTE) The Xpert Xpress SARS-CoV-2/FLU/RSV plus assay is intended as an aid in the diagnosis of influenza from Nasopharyngeal swab specimens and should not be used as a sole basis for treatment. Nasal washings and aspirates are unacceptable for Xpert Xpress  SARS-CoV-2/FLU/RSV testing.  Fact Sheet for Patients: EntrepreneurPulse.com.au  Fact Sheet for Healthcare Providers: IncredibleEmployment.be  This test is not yet approved or cleared by the [place] FDA and has been authorized for detection and/or diagnosis of SARS-CoV-2 by FDA under an Emergency Use Authorization (EUA). This EUA will remain in effect (meaning this test can be used) for the duration of the COVID-19 declaration under Section 564(b)(1) of the Act, 21 U.S.C. section 360bbb-3(b)(1), unless the authorization is terminated or revoked.     Resp Syncytial Virus by PCR NEGATIVE NEGATIVE Final    Comment: (NOTE) Fact Sheet for Patients: EntrepreneurPulse.com.au  Fact Sheet for Healthcare Providers: IncredibleEmployment.be  This test is not yet approved or cleared by the [place] FDA and has been authorized for detection and/or diagnosis of SARS-CoV-2 by FDA under an Emergency Use Authorization (EUA). This EUA will remain in effect (meaning this test can be used) for the duration of the COVID-19 declaration under Section 564(b)(1) of the Act, 21 U.S.C. section 360bbb-3(b)(1), unless the authorization is terminated or revoked.  Performed at [facility], [place] [address], [place], [place] [address]   Culture, blood (routine x 2)     Status: None   Collection Time: 04/25/22 12:16 PM   Specimen: BLOOD RIGHT ARM  Result Value Ref Range Status   Specimen Description   Final    BLOOD RIGHT ARM Performed at [facility], [place] [address], [place], [place] [address]    Special Requests   Final    BOTTLES DRAWN AEROBIC AND ANAEROBIC Blood Culture results may not be optimal due to an excessive volume of blood received in culture bottles Performed at [facility] [address], [place], [place] [address]    Culture   Final    NO GROWTH 5  DAYS Performed at [facility], [place] [address] [place], [place] [address]    Report Status 04/30/2022 FINAL  Final  Expectorated Sputum Assessment w Gram Stain, Rflx to Resp Cult     Status: None   Collection Time: 04/25/22  9:13 PM   Specimen: Expectorated Sputum  Result Value Ref Range Status   Specimen Description EXPECTORATED SPUTUM  Final   Special Requests NONE  Final   Sputum evaluation   Final    THIS SPECIMEN IS ACCEPTABLE FOR SPUTUM CULTURE Performed at [facility], [place] [address], [place], [place] [address]  Report Status 04/25/2022 FINAL  Final  Culture, Respiratory w Gram Stain     Status: None   Collection Time: 04/25/22  9:13 PM  Result Value Ref Range Status   Specimen Description   Final    EXPECTORATED SPUTUM Performed at [facility], [place] [address], [place], [place] [address]    Special Requests   Final    NONE Reflexed from [id number] Performed at [facility], [place] [address], [place], [place] [address]    Gram Stain   Final    RARE WBC PRESENT, PREDOMINANTLY PMN RARE GRAM POSITIVE COCCI    Culture   Final    Normal respiratory flora-no Staph aureus or Pseudomonas seen Performed at [facility] [address], [place], [place] [address]    Report Status 04/28/2022 FINAL  Final  Gastrointestinal Panel by PCR , Stool     Status: None   Collection Time: 04/27/22  8:05 AM   Specimen: STOOL  Result Value Ref Range Status   Campylobacter species NOT DETECTED NOT DETECTED Final   Plesimonas shigelloides NOT DETECTED NOT DETECTED Final   Salmonella species NOT DETECTED NOT DETECTED Final   Yersinia enterocolitica NOT DETECTED NOT DETECTED Final   Vibrio species NOT DETECTED NOT DETECTED Final   Vibrio cholerae NOT DETECTED NOT DETECTED Final   Enteroaggregative E coli (EAEC) NOT DETECTED NOT DETECTED Final   Enteropathogenic E coli (EPEC) NOT DETECTED NOT DETECTED Final   Enterotoxigenic  E coli (ETEC) NOT DETECTED NOT DETECTED Final   Shiga like toxin producing E coli (STEC) NOT DETECTED NOT DETECTED Final   Shigella/Enteroinvasive E coli (EIEC) NOT DETECTED NOT DETECTED Final   Cryptosporidium NOT DETECTED NOT DETECTED Final   Cyclospora cayetanensis NOT DETECTED NOT DETECTED Final   Entamoeba histolytica NOT DETECTED NOT DETECTED Final   Giardia lamblia NOT DETECTED NOT DETECTED Final   Adenovirus F40/41 NOT DETECTED NOT DETECTED Final   Astrovirus NOT DETECTED NOT DETECTED Final   Norovirus GI/GII NOT DETECTED NOT DETECTED Final   Rotavirus A NOT DETECTED NOT DETECTED Final   Sapovirus (I, II, IV, and V) NOT DETECTED NOT DETECTED Final    Comment: Performed at [facility], [address] [place], [place] [address]  C Difficile Quick Screen w PCR reflex     Status: None   Collection Time: 04/27/22  8:05 AM   Specimen: STOOL  Result Value Ref Range Status   C Diff antigen NEGATIVE NEGATIVE Final   C Diff toxin NEGATIVE NEGATIVE Final   C Diff interpretation No C. difficile detected.  Final    Comment: Performed at [facility], [place] [address], [place],  [address]     Labs: BNP (last 3 results) Recent Labs    04/25/22 1146  BNP 0000000*   Basic Metabolic Panel: Recent Labs  Lab 04/28/22 0706 04/29/22 0636 04/30/22 0459 05/01/22 0540 05/02/22 0542  NA 136 143 142 141 142  K 3.9 3.4* 3.0* 4.0 3.9  CL 107 112* 108 107 107  CO2 23 21* 25 22 23   GLUCOSE 160* 81 86 190* 105*  BUN 18 18 19  28* 40*  CREATININE 1.03* 0.91 1.17* 1.41* 1.05*  CALCIUM 8.0* 8.2* 7.8* 8.1* 8.2*  MG 1.9 1.8 2.0 2.2 2.2   Liver Function Tests: Recent Labs  Lab 05/01/22 0540  AST 34  ALT 21  ALKPHOS 46  BILITOT 0.4  PROT 6.5  ALBUMIN 2.5*   No results for input(s): "  LIPASE", "AMYLASE" in the last 168 hours. No results for input(s): "AMMONIA" in the last 168 hours. CBC: Recent Labs  Lab 04/28/22 0706 04/29/22 0636 04/30/22 0459  05/01/22 0540 05/02/22 0542  WBC 14.9* 13.2* 12.4* 10.7* 11.7*  NEUTROABS 12.2* 9.9* 9.0* 9.1* 9.2*  HGB 10.7* 10.4* 10.4* 11.1* 10.2*  HCT 32.5* 31.7* 32.1* 34.4* 31.0*  MCV 108.0* 106.4* 106.3* 107.2* 106.5*  PLT 154 157 169 208 215   Cardiac Enzymes: No results for input(s): "CKTOTAL", "CKMB", "CKMBINDEX", "TROPONINI" in the last 168 hours. BNP: Invalid input(s): "POCBNP" CBG: Recent Labs  Lab 05/04/22 1434  GLUCAP 182*   D-Dimer No results for input(s): "DDIMER" in the last 72 hours. Hgb A1c No results for input(s): "HGBA1C" in the last 72 hours. Lipid Profile No results for input(s): "CHOL", "HDL", "LDLCALC", "TRIG", "CHOLHDL", "LDLDIRECT" in the last 72 hours. Thyroid function studies No results for input(s): "TSH", "T4TOTAL", "T3FREE", "THYROIDAB" in the last 72 hours.  Invalid input(s): "FREET3" Anemia work up No results for input(s): "VITAMINB12", "FOLATE", "FERRITIN", "TIBC", "IRON", "RETICCTPCT" in the last 72 hours. Urinalysis    Component Value Date/Time   COLORURINE YELLOW 04/25/2022 1231   APPEARANCEUR CLEAR 04/25/2022 1231   LABSPEC 1.017 04/25/2022 1231   PHURINE 7.0 04/25/2022 1231   GLUCOSEU 150 (A) 04/25/2022 1231   HGBUR NEGATIVE 04/25/2022 1231   BILIRUBINUR NEGATIVE 04/25/2022 1231   KETONESUR NEGATIVE 04/25/2022 1231   PROTEINUR 30 (A) 04/25/2022 1231   UROBILINOGEN 0.2 05/17/2014 1113   NITRITE NEGATIVE 04/25/2022 1231   LEUKOCYTESUR NEGATIVE 04/25/2022 1231   Sepsis Labs Recent Labs  Lab 04/29/22 0636 04/30/22 0459 05/01/22 0540 05/02/22 0542  WBC 13.2* 12.4* 10.7* 11.7*   Microbiology Recent Results (from the past 240 hour(s))  Culture, blood (routine x 2)     Status: None   Collection Time: 04/25/22 11:46 AM   Specimen: BLOOD  Result Value Ref Range Status   Specimen Description   Final    BLOOD RIGHT ANTECUBITAL Performed at [facility], [place] [address], [place], [place] [address]    Special Requests    Final    BOTTLES DRAWN AEROBIC AND ANAEROBIC Blood Culture adequate volume Performed at [place] [address] [place], [place] [address]    Culture   Final    NO GROWTH 5 DAYS Performed at [facility], [place] [address] [place], [place] [address]    Report Status 04/30/2022 FINAL  Final  Resp panel by RT-PCR (RSV, Flu A&B, Covid) Anterior Nasal Swab     Status: None   Collection Time: 04/25/22 11:47 AM   Specimen: Anterior Nasal Swab  Result Value Ref Range Status   SARS Coronavirus 2 by RT PCR NEGATIVE NEGATIVE Final    Comment: (NOTE) SARS-CoV-2 target nucleic acids are NOT DETECTED.  The SARS-CoV-2 RNA is generally detectable in upper respiratory specimens during the acute phase of infection. The lowest concentration of SARS-CoV-2 viral copies this assay can detect is 138 copies/mL. A negative result does not preclude SARS-Cov-2 infection and should not be used as the sole basis for treatment or other patient management decisions. A negative result may occur with  improper specimen collection/handling, submission of specimen other than nasopharyngeal swab, presence of viral mutation(s) within the areas targeted by this assay, and inadequate number of viral copies(<138 copies/mL). A negative result must be combined with clinical observations, patient history, and epidemiological information. The expected result is Negative.  Fact Sheet for Patients:  EntrepreneurPulse.com.au  Fact Sheet for Healthcare Providers:  IncredibleEmployment.be  This test is no t yet approved or cleared by the [place] FDA and  has been authorized for detection and/or diagnosis of SARS-CoV-2 by FDA under an Emergency Use Authorization (EUA). This EUA will remain  in effect (meaning this test can be used) for the duration of the COVID-19 declaration under Section 564(b)(1) of the Act, 21 U.S.C.section 360bbb-3(b)(1), unless the  authorization is terminated  or revoked sooner.       Influenza A by PCR NEGATIVE NEGATIVE Final   Influenza B by PCR NEGATIVE NEGATIVE Final    Comment: (NOTE) The Xpert Xpress SARS-CoV-2/FLU/RSV plus assay is intended as an aid in the diagnosis of influenza from Nasopharyngeal swab specimens and should not be used as a sole basis for treatment. Nasal washings and aspirates are unacceptable for Xpert Xpress SARS-CoV-2/FLU/RSV testing.  Fact Sheet for Patients: EntrepreneurPulse.com.au  Fact Sheet for Healthcare Providers: IncredibleEmployment.be  This test is not yet approved or cleared by the [place] FDA and has been authorized for detection and/or diagnosis of SARS-CoV-2 by FDA under an Emergency Use Authorization (EUA). This EUA will remain in effect (meaning this test can be used) for the duration of the COVID-19 declaration under Section 564(b)(1) of the Act, 21 U.S.C. section 360bbb-3(b)(1), unless the authorization is terminated or revoked.     Resp Syncytial Virus by PCR NEGATIVE NEGATIVE Final    Comment: (NOTE) Fact Sheet for Patients: EntrepreneurPulse.com.au  Fact Sheet for Healthcare Providers: IncredibleEmployment.be  This test is not yet approved or cleared by the [place] FDA and has been authorized for detection and/or diagnosis of SARS-CoV-2 by FDA under an Emergency Use Authorization (EUA). This EUA will remain in effect (meaning this test can be used) for the duration of the COVID-19 declaration under Section 564(b)(1) of the Act, 21 U.S.C. section 360bbb-3(b)(1), unless the authorization is terminated or revoked.  Performed at [facility], [place] [address] [place], [place] [address]   Culture, blood (routine x 2)     Status: None   Collection Time: 04/25/22 12:16 PM   Specimen: BLOOD RIGHT ARM  Result Value Ref Range Status   Specimen  Description   Final    BLOOD RIGHT ARM Performed at [facility], [place] [address], [place], [place] [address]    Special Requests   Final    BOTTLES DRAWN AEROBIC AND ANAEROBIC Blood Culture results may not be optimal due to an excessive volume of blood received in culture bottles Performed at [place] [address], [place], [place] [address]    Culture   Final    NO GROWTH 5 DAYS Performed at [facility], [place] [address], [place], [place] [address]    Report Status 04/30/2022 FINAL  Final  Expectorated Sputum Assessment w Gram Stain, Rflx to Resp Cult     Status: None   Collection Time: 04/25/22  9:13 PM   Specimen: Expectorated Sputum  Result Value Ref Range Status   Specimen Description EXPECTORATED SPUTUM  Final   Special Requests NONE  Final   Sputum evaluation   Final    THIS SPECIMEN IS ACCEPTABLE FOR SPUTUM CULTURE Performed at [facility], [place] [address] [place], [place] [address]    Report Status 04/25/2022 FINAL  Final  Culture, Respiratory w Gram Stain     Status: None   Collection Time: 04/25/22  9:13 PM  Result Value Ref Range Status  Specimen Description   Final    EXPECTORATED SPUTUM Performed at [place] [address] [place], [place] [address]    Special Requests   Final    NONE Reflexed from [id number] Performed at [facility], [place] [address], [place], [place] [address]    Gram Stain   Final    RARE WBC PRESENT, PREDOMINANTLY PMN RARE GRAM POSITIVE COCCI    Culture   Final    Normal respiratory flora-no Staph aureus or Pseudomonas seen Performed at [place] [address] [place], [place] [address]    Report Status 04/28/2022 FINAL  Final  Gastrointestinal Panel by PCR , Stool     Status: None   Collection Time: 04/27/22  8:05 AM   Specimen: STOOL  Result Value Ref Range Status   Campylobacter species NOT DETECTED NOT DETECTED Final    Plesimonas shigelloides NOT DETECTED NOT DETECTED Final   Salmonella species NOT DETECTED NOT DETECTED Final   Yersinia enterocolitica NOT DETECTED NOT DETECTED Final   Vibrio species NOT DETECTED NOT DETECTED Final   Vibrio cholerae NOT DETECTED NOT DETECTED Final   Enteroaggregative E coli (EAEC) NOT DETECTED NOT DETECTED Final   Enteropathogenic E coli (EPEC) NOT DETECTED NOT DETECTED Final   Enterotoxigenic E coli (ETEC) NOT DETECTED NOT DETECTED Final   Shiga like toxin producing E coli (STEC) NOT DETECTED NOT DETECTED Final   Shigella/Enteroinvasive E coli (EIEC) NOT DETECTED NOT DETECTED Final   Cryptosporidium NOT DETECTED NOT DETECTED Final   Cyclospora cayetanensis NOT DETECTED NOT DETECTED Final   Entamoeba histolytica NOT DETECTED NOT DETECTED Final   Giardia lamblia NOT DETECTED NOT DETECTED Final   Adenovirus F40/41 NOT DETECTED NOT DETECTED Final   Astrovirus NOT DETECTED NOT DETECTED Final   Norovirus GI/GII NOT DETECTED NOT DETECTED Final   Rotavirus A NOT DETECTED NOT DETECTED Final   Sapovirus (I, II, IV, and V) NOT DETECTED NOT DETECTED Final    Comment: Performed at [facility], [place] [place], [place] [address]  C Difficile Quick Screen w PCR reflex     Status: None   Collection Time: 04/27/22  8:05 AM   Specimen: STOOL  Result Value Ref Range Status   C Diff antigen NEGATIVE NEGATIVE Final   C Diff toxin NEGATIVE NEGATIVE Final   C Diff interpretation No C. difficile detected.  Final    Comment: Performed at [facility], [place] [address] [place], [place] [address]    Procedures/Studies: DG CHEST PORT 1 VIEW  Result Date: 04/30/2022 CLINICAL DATA:  Shortness of breath EXAM: PORTABLE CHEST 1 VIEW COMPARISON:  04/25/2022 FINDINGS: Cardiomegaly. Pulmonary venous hypertension with mild interstitial edema. No evidence of consolidation, collapse or measurable effusion. Small amount of fluid in the minor fissure on the right. No  acute bone finding. Previous right shoulder replacement and previous cervical surgery. IMPRESSION: Cardiomegaly, pulmonary venous hypertension and mild interstitial edema. Findings consistent with congestive heart failure. Electronically Signed   By: Nelson Chimes M.D.   On: 04/30/2022 10:08   VAS [place] UPPER EXTREMITY VENOUS DUPLEX  Result Date: 04/29/2022 UPPER VENOUS STUDY  Patient Name:  Ebony Scott  Date of Exam:   04/27/2022 Medical Rec #: [id number]     Accession #:    [id number] Date of Birth: [birth year]      Patient Gender: F Patient Age:   2 years Exam Location:  [facility] Procedure:  VAS [place] UPPER EXTREMITY VENOUS DUPLEX Referring Phys: Aline August --------------------------------------------------------------------------------  Indications: Swelling Risk Factors: None identified. Limitations: Poor ultrasound/tissue interface. Comparison Study: No prior studies. Performing Technologist: Oliver Hum RVT  Examination Guidelines: A complete evaluation includes B-mode imaging, spectral Doppler, color Doppler, and power Doppler as needed of all accessible portions of each vessel. Bilateral testing is considered an integral part of a complete examination. Limited examinations for reoccurring indications may be performed as noted.  Right Findings: +----------+------------+---------+-----------+----------+-------+ RIGHT     CompressiblePhasicitySpontaneousPropertiesSummary +----------+------------+---------+-----------+----------+-------+ Subclavian    Full       Yes       Yes                      +----------+------------+---------+-----------+----------+-------+  Left Findings: +----------+------------+---------+-----------+----------+-------+ LEFT      CompressiblePhasicitySpontaneousPropertiesSummary +----------+------------+---------+-----------+----------+-------+ IJV           Full       Yes       Yes                       +----------+------------+---------+-----------+----------+-------+ Subclavian    Full       Yes       Yes                      +----------+------------+---------+-----------+----------+-------+ Axillary      Full       Yes       Yes                      +----------+------------+---------+-----------+----------+-------+ Brachial      Full       Yes       Yes                      +----------+------------+---------+-----------+----------+-------+ Radial        Full                                          +----------+------------+---------+-----------+----------+-------+ Ulnar         Full                                          +----------+------------+---------+-----------+----------+-------+ Cephalic      Full                                          +----------+------------+---------+-----------+----------+-------+ Basilic       Full                                          +----------+------------+---------+-----------+----------+-------+  Summary:  Right: No evidence of thrombosis in the subclavian.  Left: No evidence of deep vein thrombosis in the upper extremity. No evidence of superficial vein thrombosis in the upper extremity.  *See table(s) above for measurements and observations.  Diagnosing physician: Jamelle Haring Electronically signed by Jamelle Haring on 04/29/2022 at 4:09:52 PM.    Final    DG Hand 2 View Left  Result Date: 04/27/2022 CLINICAL DATA:  History of rheumatoid arthritis. Worsening  left hand pain. EXAM: LEFT HAND - 2 VIEW COMPARISON:  Left hand radiographs 03/07/2022 FINDINGS: Postsurgical changes are again seen of trapezium resection. Mild high-grade peripheral degenerative osteophytosis at the base of the thumb metacarpal. Mild distal scaphoid peripheral degenerative osteophytosis and distal lateral subacromial cystic change. Severe index finger DIP joint and fourth finger PIP joint space narrowing with bone-on-bone contact. Moderate to  large index finger PIP degenerative osteophytes. Moderate to severe third DIP, moderate index finger PIP, and mild-to-moderate fourth and fifth DIP and fifth PIP joint space narrowing and peripheral osteophytosis. Severe third and moderate second, fourth, and fifth metacarpophalangeal joint space narrowing. Mild chondrocalcinosis within the triangular fibrocartilage complex. IMPRESSION: 1. Postsurgical changes of trapezium resection. 2. 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 3. Moderate to severe third and moderate second, fourth, and fifth metacarpophalangeal joint space narrowing and peripheral osteophytosis. This can be seen with osteoarthritis or rheumatoid arthritis. Electronically Signed   By: Yvonne Kendall M.D.   On: 04/27/2022 11:08   CT ABDOMEN PELVIS WO CONTRAST  Result Date: 04/25/2022 CLINICAL DATA:  Abdominal pain EXAM: CT ABDOMEN AND PELVIS WITHOUT CONTRAST TECHNIQUE: Multidetector CT imaging of the abdomen and pelvis was performed following the standard protocol without IV contrast. RADIATION DOSE REDUCTION: This exam was performed according to the departmental dose-optimization program which includes automated exposure control, adjustment of the mA and/or kV according to patient size and/or use of iterative reconstruction technique. COMPARISON:  10/04/2020 FINDINGS: Lower chest: Moderate patchy infiltrates are seen in right lower lung fields. Small patchy infiltrates are seen in left lower lung field. There is no pleural effusion. Hepatobiliary: Surgical clips are seen in gallbladder fossa. There is no dilation of bile ducts. Pancreas: There is fatty infiltration. Spleen: Spleen is not enlarged. Adrenals/Urinary Tract: Adrenals are unremarkable. There is no hydronephrosis. There are no renal or ureteral stones. Urinary bladder is unremarkable. Stomach/Bowel: Small hiatal hernia is seen. Small bowel loops are not dilated. Appendix is not distinctly visualized. There is no  pericecal inflammation. There is diffuse wall thickening in rectum. There is stranding in the perirectal fat planes without any loculated fluid collections. Vascular/Lymphatic: Scattered calcifications are seen in aorta and its major branches including coronary arteries. Reproductive: Uterus is not seen. Other: There is no ascites or pneumoperitoneum. There are coarse calcifications in left breast. Musculoskeletal: Degenerative changes are noted in lumbar spine, more severe at L4-L5 level with spinal stenosis and encroachment of neural foramina. IMPRESSION: There is abnormal diffuse wall thickening in rectum along with perirectal stranding. Findings suggest acute proctitis. Possibility of underlying neoplastic process is not excluded. When the patient's clinical condition permits, endoscopy should be considered. There are patchy infiltrates in both lower lung fields, more so on the right side suggesting atelectasis/pneumonia. There is no evidence of intestinal obstruction or pneumoperitoneum. There is no hydronephrosis. Arteriosclerosis.  Lumbar spondylosis. Other findings as described in the body of the report. Electronically Signed   By: Elmer Picker M.D.   On: 04/25/2022 14:25   CT Head Wo Contrast  Result Date: 04/25/2022 CLINICAL DATA:  Altered mental status EXAM: CT HEAD WITHOUT CONTRAST TECHNIQUE: Contiguous axial images were obtained from the base of the skull through the vertex without intravenous contrast. RADIATION DOSE REDUCTION: This exam was performed according to the departmental dose-optimization program which includes automated exposure control, adjustment of the mA and/or kV according to patient size and/or use of iterative reconstruction technique. COMPARISON:  02/23/2020 FINDINGS: Brain: No acute intracranial findings are seen. There are  no signs of bleeding within the cranium. Cortical sulci are prominent. There is decreased density in periventricular and subcortical white matter.  Vascular: Unremarkable. Skull: No fracture is seen in calvarium. Sinuses/Orbits: There is mucosal thickening in the ethmoid sinus. Other: None. IMPRESSION: No acute intracranial findings are seen in noncontrast CT brain. Atrophy. Small-vessel disease. Electronically Signed   By: Elmer Picker M.D.   On: 04/25/2022 14:15   DG Chest Port 1 View  Result Date: 04/25/2022 CLINICAL DATA:  Fever EXAM: PORTABLE CHEST 1 VIEW COMPARISON:  X-ray 02/04/2021.  CT 04/16/2022 FINDINGS: Enlarged cardiopericardial silhouette with vascular congestion. No pneumothorax, effusion. No consolidation. Overlapping cardiac leads. Film is under penetrated. Fixation hardware along the right shoulder and cervical spine at the edge of the imaging field. IMPRESSION: Enlarged cardiopericardial silhouette with vascular congestion. Electronically Signed   By: Jill Side M.D.   On: 04/25/2022 13:10   CT CHEST HIGH RESOLUTION  Result Date: 04/17/2022 CLINICAL DATA:  Chronic cough, history of rheumatoid arthritis history of left breast cancer * Tracking Code: BO * EXAM: CT CHEST WITHOUT CONTRAST TECHNIQUE: Multidetector CT imaging of the chest was performed following the standard protocol without intravenous contrast. High resolution imaging of the lungs, as well as inspiratory and expiratory imaging, was performed. RADIATION DOSE REDUCTION: This exam was performed according to the departmental dose-optimization program which includes automated exposure control, adjustment of the mA and/or kV according to patient size and/or use of iterative reconstruction technique. COMPARISON:  08/23/2020 FINDINGS: Cardiovascular: Aortic atherosclerosis. Aortic valve calcifications. Normal heart size. Three-vessel coronary artery calcifications and or stents no pericardial effusion. Mediastinum/Nodes: No enlarged mediastinal, hilar, or axillary lymph nodes. Thyroid gland, trachea, and esophagus demonstrate no significant findings. Lungs/Pleura: Criss Rosales  appearing scarring of the bilateral lung bases with mild associated tubular bronchiectasis, similar to prior examination. No significant air trapping on expiratory phase imaging. Fine, clustered centrilobular and tree-in-bud nodularity of the posterior right upper lobe, unchanged (series 7, image 66). Minimal subpleural radiation fibrosis of the anterior left lung (series 7, image 70). No pleural effusion or pneumothorax. Upper Abdomen: No acute abnormality.  Status post cholecystectomy. Musculoskeletal: Postoperative findings of left lumpectomy. No acute osseous findings. Chronic fracture deformities of the posterior left ribs. Status post right shoulder arthroplasty. IMPRESSION: 1. Bland appearing scarring of the bilateral lung bases with mild associated tubular bronchiectasis, similar to prior examination. No findings of fibrotic interstitial lung disease. 2. Fine, clustered centrilobular and tree-in-bud nodularity of the posterior right upper lobe, unchanged and consistent with atypical infection, including atypical mycobacterium. No evidence of ongoing infection. 3. Minimal subpleural radiation fibrosis of the anterior left lung. 4. Postoperative findings of left lumpectomy. 5. Coronary artery disease. 6. Aortic valve calcifications. Correlate for echocardiographic evidence of aortic valve dysfunction. Aortic Atherosclerosis (ICD10-I70.0). Electronically Signed   By: Delanna Ahmadi M.D.   On: 04/17/2022 14:46     Time coordinating discharge: Over 30 minutes    Dwyane Dee, MD  [facility] 05/04/2022, 2:57 PM

## 2022-05-04 NOTE — TOC Progression Note (Signed)
Transition of Care [facility]) - Progression Note    Patient Details  Name: Ebony Scott MRN: [id number] Date of Birth: [birth year]  Transition of Care [facility]) CM/SW [place], RN Phone Number:[phone]  05/04/2022, 10:45 AM  Clinical Narrative:    CM at bedside to update patient on bed offer. Patient has been made aware that [facility] can not extend a bed offer. Patient is agreeable to placement at [facility]. CM has sent message to [facility] awaiting to determine if facility has a bed available today.    Expected Discharge Plan: [place] Barriers to Discharge: Continued Medical Work up  Expected Discharge Plan and Services In-house Referral: NA Discharge Planning Services: CM Consult Post Acute Care Choice: NA Living arrangements for the past 2 months: Single Family Home                 DME Arranged: N/A DME Agency: NA       HH Arranged: NA [place] Agency: NA         Social Determinants of Health (SDOH) Interventions SDOH Screenings   Food Insecurity: No Food Insecurity (04/25/2022)  Housing: Low Risk  (04/25/2022)  Transportation Needs: No Transportation Needs (04/25/2022)  Utilities: Not At Risk (04/25/2022)  Alcohol Screen: Low Risk  (07/13/2021)  Depression (PHQ2-9): Low Risk  (07/13/2021)  Financial Resource Strain: Low Risk  (04/18/2022)  Physical Activity: Insufficiently Active (07/13/2021)  Social Connections: Moderately Isolated (07/13/2021)  Stress: No Stress Concern Present (07/13/2021)  Tobacco Use: Low Risk  (04/25/2022)    Readmission Risk Interventions    04/27/2022    2:05 PM 09/26/2020    3:55 PM  Readmission Risk Prevention Plan  Transportation Screening Complete Complete  PCP or Specialist Appt within 5-7 Days Complete   Home Care Screening Complete   Medication Review (RN CM) Referral to Pharmacy   Medication Review ([place])  Complete  PCP or Specialist appointment within 3-5 days of discharge  Complete  HRI or [place]   Complete  SW Recovery Care/Counseling Consult  Complete  [place]  Not Applicable

## 2022-05-04 NOTE — Discharge Summary (Addendum)
Physician Discharge Summary   [name] [id number] DOB: [birth year] DOA: 04/25/2022  PCP: [name], MD  Admit date: 04/25/2022 Discharge date:  05/05/2022  Admitted From: Home Disposition:  [facility] Discharging physician: Dwyane Dee, MD Barriers to discharge: none  Recommendations for Outpatient Follow-up:  Follow up with cardiology Okay to hold Hizentra while at rehab (I've indicated this in prescription below as well)   Discharge Condition: stable CODE STATUS: Full Diet recommendation:  Diet Orders (From admission, onward)     Start     Ordered   05/05/22 0000  Diet general        05/05/22 1155   05/04/22 0000  Diet general        05/04/22 1126   05/02/22 1118  Diet regular Room service appropriate? Yes; Fluid consistency: Thin; Fluid restriction: 2000 mL Fluid  Diet effective now       Question Answer Comment  Room service appropriate? Yes   Fluid consistency: Thin   Fluid restriction: 2000 mL Fluid      05/02/22 1117            Hospital Course: Ebony Scott is a 72 y.o. 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   On presentation, she was febrile to 103 with bradycardia and dyspnea. CT of the head without contrast was negative for any acute intracranial abnormality. CT of abdomen and pelvis without contrast showed possible acute proctitis, possibility of underlying neoplastic process is not excluded; patchy infiltrates in both lower lobes suggestive of atelectasis/pneumonia. She was given IV fluids and antibiotics.  GI and cardiology were consulted.  Subsequently signed off.   Assessment and Plan:  Sepsis due to CAP, POA - resolved  Acute  respiratory failure with hypoxia - resolved  Acute proctitis - resolved  -Presented with altered mental status, fever, diarrhea, tachycardia, tachypnea, leukocytosis with pneumonia and possible proctitis -Received Zithromax for 5 days and Rocephin discontinued on 3/11.  DC'd Flagyl on 04/30/2022 because of persistent nausea and decreased oral intake.  COVID-19/influenza/RSV PCR negative on presentation.  Blood cultures negative so far.  Urine streptococcal antigen negative.  Sputum cultures negative so far. - continue weaning O2 -GI evaluation appreciated.  Continue conservative management.  GI has already signed off.  Stool for C. difficile and GI PCR negative.  Diarrhea improving.  Use Imodium as needed.   Leukocytosis - overall stable and downtrended   Acute metabolic encephalopathy - resolved  -Possibly from above.  CT of the brain negative for acute intracranial abnormality.  Fall precautions. -Monitor mental status.  PT recommends SNF placement.  TOC consulted. - mentation now back to normal baseline    Acute on Chronic systolic heart failure Hypertension -Last echo on 05/25/2021 had shown EF of 30 to 35%.  Not fluid overloaded at this time.  Strict input and output Daily weights.  Fluid restriction.  Outpatient follow-up with cardiology. -Cardiology signed off on 04/28/2022: [name] has been started by cardiology on 04/27/2022.  BiDil and Wilder Glade resumed on 04/28/2022 and Lasix resumed from 04/29/2022.   Nausea -Questionable cause. DC'd Flagyl and sodium bicarbonate on 04/30/2022.  s/p scheduled zofran. Nausea is improving.  Will switch to Zofran as needed   Chronic kidney disease stage IIIb Acute metabolic acidosis - baseline creat 1.2 - now back to  baseline    Prolonged QT - continue monitoring electrolytes intermittently    Hypokalemia -Improved   Mildly positive troponins -Possible from sepsis/pneumonia.  No chest pain.  Troponins did not trend upward significantly. EKG negative  for signs of ischemia on admission    Bilateral conjunctivitis - resolved  - s/p eye drops   Asthma Bronchiectasis -Continue current inhaled/nebulized regimen.  Outpatient follow-up with pulmonary. -Currently montelukast   History of DVT/PE -Continue Xarelto   History of seronegative rheumatoid arthritis -Outpatient follow-up with rheumatology.   Home dose of prednisone resumed from 04/27/2022 onwards.  Left upper extremity duplex ultrasound negative for DVT.  X-ray of left hand shows features of rheumatoid arthritis. -Home prednisone resumed - Resume Plaquenil 05/03/2022   Hypogammaglobulinemia -On Hizentra as an outpatient. Okay to hold while at rehab.   Principal Diagnosis: Community acquired pneumonia  Discharge Diagnoses: Active Hospital Problems   Diagnosis Date Noted   Physical deconditioning 09/25/2020    Priority: 2.   Acute on chronic combined systolic and diastolic CHF (congestive heart failure) ([place]) 02/08/2015    Priority: 3.   Abnormal CT of the abdomen 04/26/2022   DMII (diabetes mellitus, type 2) ([place]) 09/23/2020   Rheumatoid arthritis ([place])    Depression 01/14/2018   Protein-calorie malnutrition, moderate ([place]) 12/17/2017   Chronic kidney disease, stage 3b ([place]) 04/15/2016   Hyperlipidemia 12/23/2009    Resolved Hospital Problems   Diagnosis Date Noted Date Resolved   Community acquired pneumonia 05/31/2011 05/02/2022    Priority: 1.   Severe sepsis [facility]) 05/02/2022 05/02/2022    Priority: 1.   Proctitis 04/26/2022 05/04/2022    Priority: 2.     Discharge Instructions     Diet general   Complete by: As directed    Diet general   Complete by: As directed    Increase activity slowly   Complete by: As directed    Increase activity slowly   Complete by: As directed       Allergies as of 05/05/2022       Reactions   Baclofen Anaphylaxis, Other (See Comments)   Altered mental status requiring a 3-day hospital stay- patient became  unresponsive "Put me in a coma for five days", Altered mental status requiring a 3-day hospital stay- patient became unresponsive   Dust Mite Extract Shortness Of Breath, Other (See Comments)   "sneezing" (02/20/2012) too   Molds & Smuts Shortness Of Breath   Morphine And Related Hives, Itching   Other Shortness Of Breath, Other (See Comments)   Grass and weeds "sneezing; filled sinuses" (02/20/2012)   Penicillins Rash, Other (See Comments)   "welts" (02/20/2012) Tolerated Ancef 02/16/20   Rofecoxib Swelling, Other (See Comments)   Vioxx- feet swelling   Shellfish Allergy Anaphylaxis, Shortness Of Breath, Itching, Rash   Shrimp Extract Anaphylaxis, Other (See Comments)   ALL SHELLFISH   Tetracycline Hcl Nausea And Vomiting   Tetracycline Hcl Other (See Comments)   GI Intolerance   Xolair [omalizumab] Other (See Comments)   Caused Blood clot   Zoledronic Acid Other (See Comments)   Reclast- Fever, Put in hospital, dr said it was a reaction from a reaction  Fever, inability to walk, Put in hospital Fever, Put in hospital, dr said it was a reaction from a reaction , Other reaction(s): Unknown   Dilaudid [hydromorphone Hcl] Itching   Hydrocodone-acetaminophen Itching   Levofloxacin Other (See Comments)   GI upset Other Reaction(s): GI Intolerance, Other (See Comments) REACTION: GI upset, GI upset  Oxycodone Hcl Nausea And Vomiting   Oxycodone Hcl Other (See Comments)   GI Intolerance   Paroxetine Nausea And Vomiting, Other (See Comments)   GI Intolerance also   Celecoxib Swelling, Other (See Comments)   Feet swelling   Diltiazem Hcl Swelling   Lactose Other (See Comments)   Bloating and gas   Lactose Intolerance (gi) Other (See Comments)   Bloating and gas   Oxycodone-acetaminophen Itching   Rituximab Other (See Comments)   Anemia    Tree Extract Other (See Comments)   "tested and told I was allergic to it; never experienced a reaction to it" (02/20/2012)   Gamunex [immune  Globulin] Itching, Rash   Tolerates hizentra    Penicillin G Procaine Rash, Other (See Comments)   Welts, also        Medication List     STOP taking these medications    budesonide 1 MG/2ML nebulizer solution Commonly known as: PULMICORT   DermOtic 0.01 % Oil Generic drug: Fluocinolone Acetonide   metroNIDAZOLE 250 MG tablet Commonly known as: FLAGYL   valsartan 40 MG tablet Commonly known as: Diovan       TAKE these medications    acetaminophen 650 MG CR tablet Commonly known as: TYLENOL Take 1,300 mg by mouth daily as needed for pain.   acetic acid-hydrocortisone OTIC solution Commonly known as: VOSOL-HC Place 4 drops into both ears See admin instructions. Instill 4 drops into both ears the first 5 days of the month, then as directed/as needed for itching   albuterol 108 (90 Base) MCG/ACT inhaler Commonly known as: VENTOLIN HFA Inhale 2 puffs into the lungs every 6 (six) hours as needed for wheezing or shortness of breath.   ALIGN PO Take 1 capsule by mouth daily.   allopurinol 100 MG tablet Commonly known as: ZYLOPRIM TAKE 1 TABLET BY MOUTH DAILY What changed: when to take this   Alpha-Lipoic Acid 600 MG Tabs Take 600 mg by mouth daily.   ammonium lactate 12 % lotion Commonly known as: Amlactin Daily Apply 1 Application topically as needed for dry skin.   atorvastatin 80 MG tablet Commonly known as: LIPITOR TAKE 1 TABLET BY MOUTH DAILY What changed: when to take this   azelastine 0.1 % nasal spray Commonly known as: ASTELIN Place 2 sprays into both nostrils daily. Use in each nostril as directed   BEANO PO Take 2 tablets by mouth 3 (three) times daily before meals.   benzonatate 200 MG capsule Commonly known as: TESSALON TAKE 1 CAPSULE BY MOUTH THREE TIMES DAILY AS NEEDED FOR COUGH What changed: See the new instructions.   Biofreeze 4 % Gel Generic drug: Menthol (Topical Analgesic) Apply 1 application  topically daily as needed (for  shoulder or joint pain).   CALCIUM MAGNESIUM PO Take 1 tablet by mouth at bedtime.   cetirizine 10 MG tablet Commonly known as: ZYRTEC Take 1 tablet (10 mg total) by mouth 2 (two) times daily as needed for allergies (Can take a n extra dose during flare ups.). What changed: when to take this   clopidogrel 75 MG tablet Commonly known as: PLAVIX Take 1 tablet (75 mg total) by mouth daily.   cyproheptadine 4 MG tablet Commonly known as: PERIACTIN Take 2 tablets (8 mg total) by mouth at bedtime. Take 2 tablets at bedtime as needed to treat facial pain or sleep dysfunction. What changed:  when to take this additional instructions   denosumab 60 MG/ML Sosy injection Commonly known as: PROLIA Inject  60 mg into the skin every 6 (six) months.   dexlansoprazole 60 MG capsule Commonly known as: Dexilant Take 1 capsule (60 mg total) by mouth daily.   diclofenac Sodium 1 % Gel Commonly known as: VOLTAREN Apply 1 Application topically 4 (four) times daily as needed (pain).   empagliflozin 10 MG Tabs tablet Commonly known as: Jardiance Take 1 tablet (10 mg total) by mouth daily before breakfast.   EPINEPHrine 0.3 mg/0.3 mL Soaj injection Commonly known as: EPI-PEN USE AS DIRECTED BY YOUR PHYSICIAN INTRAMUSCULARLY AS NEEDED FOR ANAPHYLAXIS What changed:  how much to take how to take this when to take this reasons to take this additional instructions   famotidine 20 MG tablet Commonly known as: PEPCID TAKE 1 TABLET BY MOUTH EACH NIGHT AT BEDTIME What changed: See the new instructions.   fluticasone 50 MCG/ACT nasal spray Commonly known as: FLONASE USE 2 SPRAYS IN EACH NOSTRIL DAILY   folic acid 1 MG tablet Commonly known as: FOLVITE Take 1 tablet (1 mg total) by mouth daily.   furosemide 40 MG tablet Commonly known as: LASIX Take 1 tablet (40 mg total) by mouth daily. What changed: when to take this   gabapentin 300 MG capsule Commonly known as: NEURONTIN Take 1  capsule (300 mg total) by mouth at bedtime.   Gerhardt's butt cream Crea Apply topically 3 times a day as needed for itching or irritation   guaiFENesin 600 MG 12 hr tablet Commonly known as: MUCINEX Take 1 tablet (600 mg total) by mouth 2 (two) times daily as needed for cough or to loosen phlegm. What changed: when to take this   Hizentra 1 GM/5ML Soln Generic drug: Immune Globulin (Human) 13 g by Subcutaneous Infusion route See admin instructions. On hold while at rehab. Hizentra- 13 g infused every Friday  ("3 times/5 ml = 65 ml total once weekly") Start taking on: June 04, 2022 What changed:  additional instructions These instructions start on June 04, 2022. If you are unsure what to do until then, ask your doctor or other care provider.   hydroxychloroquine 200 MG tablet Commonly known as: PLAQUENIL Take 1 tablet (200 mg total) by mouth daily.   ipratropium-albuterol 0.5-2.5 (3) MG/3ML Soln Commonly known as: DUONEB Take 3 mLs by nebulization every 6 (six) hours as needed. What changed: reasons to take this   isosorbide-hydrALAZINE 20-37.5 MG tablet Commonly known as: BIDIL Take 1 tablet by mouth 2 (two) times daily. What changed: when to take this   LACTOSE FAST ACTING RELIEF PO Take 3 tablets by mouth 3 (three) times daily before meals.   lidocaine-prilocaine cream Commonly known as: EMLA Apply 1 application  topically as needed (pain).   magnesium oxide 400 (240 Mg) MG tablet Commonly known as: MAG-OX Take 400 mg by mouth at bedtime.   mirtazapine 30 MG disintegrating tablet Commonly known as: REMERON SOL-TAB Take 1 tablet (30 mg total) by mouth at bedtime as needed (sleep). What changed: when to take this   montelukast 10 MG tablet Commonly known as: SINGULAIR Take 1 tablet (10 mg total) by mouth daily. What changed: when to take this   nystatin 100000 UNIT/ML suspension Commonly known as: MYCOSTATIN Use as directed 15 mLs in the mouth or throat at  bedtime.   potassium chloride 10 MEQ tablet Commonly known as: KLOR-CON M Take 1 tablet (10 mEq total) by mouth daily. What changed: when to take this   predniSONE 5 MG tablet Commonly known as: DELTASONE Take 1  tablet (5 mg total) by mouth daily with breakfast. Along with 2 mg tablets for total daily dose 7 mg total daily What changed: Another medication with the same name was changed. Make sure you understand how and when to take each.   predniSONE 1 MG tablet Commonly known as: DELTASONE Take 2 tablets (2 mg total) by mouth daily. Total of 7 mg daily. What changed: when to take this   PRENATAL VITAMINS PO Take 1 tablet by mouth daily.   sacubitril-valsartan 24-26 MG Commonly known as: ENTRESTO Take 1 tablet by mouth 2 (two) times daily.   Stiolto Respimat 2.5-2.5 MCG/ACT Aers Generic drug: Tiotropium Bromide-Olodaterol Inhale 2 puffs into the lungs daily. What changed: when to take this   Systane Complete 0.6 % Soln Generic drug: Propylene Glycol Place 1 drop into both eyes 2 (two) times daily.   traMADol 50 MG tablet Commonly known as: ULTRAM Take 1 tablet (50 mg total) by mouth at bedtime.   Vitamin D3 25 MCG (1000 UT) capsule Generic drug: Cholecalciferol Take 1,000 Units by mouth at bedtime.   Xarelto 20 MG Tabs tablet Generic drug: rivaroxaban TAKE 1 TABLET BY MOUTH DAILY WITH SUPPER What changed: how much to take        Contact information for after-discharge care     Destination     [facility] Preferred SNF .   Service: Skilled Nursing Contact information: [id number] [address] [phone]                    Allergies  Allergen Reactions   Baclofen Anaphylaxis and Other (See Comments)    Altered mental status requiring a 3-day hospital stay- patient became unresponsive   "Put me in a coma for five days", Altered mental status requiring a 3-day hospital stay- patient became  unresponsive   Dust Mite Extract Shortness Of Breath and Other (See Comments)    "sneezing" (02/20/2012) too   Molds & Smuts Shortness Of Breath   Morphine And Related Hives and Itching   Other Shortness Of Breath and Other (See Comments)    Grass and weeds "sneezing; filled sinuses" (02/20/2012)   Penicillins Rash and Other (See Comments)    "welts" (02/20/2012)  Tolerated Ancef 02/16/20   Rofecoxib Swelling and Other (See Comments)    Vioxx- feet swelling   Shellfish Allergy Anaphylaxis, Shortness Of Breath, Itching and Rash   Shrimp Extract Anaphylaxis and Other (See Comments)    ALL SHELLFISH   Tetracycline Hcl Nausea And Vomiting   Tetracycline Hcl Other (See Comments)    GI Intolerance   Xolair [Omalizumab] Other (See Comments)    Caused Blood clot   Zoledronic Acid Other (See Comments)    Reclast- Fever, Put in hospital, dr said it was a reaction from a reaction    Fever, inability to walk, Put in hospital  Fever, Put in hospital, dr said it was a reaction from a reaction , Other reaction(s): Unknown   Dilaudid [Hydromorphone Hcl] Itching   Hydrocodone-Acetaminophen Itching   Levofloxacin Other (See Comments)    GI upset  Other Reaction(s): GI Intolerance, Other (See Comments)  REACTION: GI upset, GI upset   Oxycodone Hcl Nausea And Vomiting   Oxycodone Hcl Other (See Comments)    GI Intolerance   Paroxetine Nausea And Vomiting and Other (See Comments)    GI Intolerance also   Celecoxib Swelling and Other (See Comments)    Feet swelling   Diltiazem  Hcl Swelling   Lactose Other (See Comments)    Bloating and gas   Lactose Intolerance (Gi) Other (See Comments)    Bloating and gas   Oxycodone-Acetaminophen Itching   Rituximab Other (See Comments)    Anemia    Tree Extract Other (See Comments)    "tested and told I was allergic to it; never experienced a reaction to it" (02/20/2012)   Gamunex [Immune Globulin] Itching and Rash    Tolerates hizentra    Penicillin G  Procaine Rash and Other (See Comments)    Welts, also    Consultations: Cardiology  Procedures:   Discharge Exam: BP 133/82 (BP Location: Right Arm)   Pulse 83   Temp 97.9 F (36.6 C) (Oral)   Resp 18   Ht 5\' 1"  (1.549 m)   Wt 62.1 kg   SpO2 98%   BMI 25.89 kg/m  Physical Exam Constitutional:      General: She is not in acute distress.    Appearance: Normal appearance. She is not ill-appearing.  HENT:     Head: Normocephalic and atraumatic.     Mouth/Throat:     Mouth: Mucous membranes are moist.  Eyes:     Extraocular Movements: Extraocular movements intact.  Cardiovascular:     Rate and Rhythm: Normal rate and regular rhythm.  Pulmonary:     Effort: Pulmonary effort is normal. No respiratory distress.     Breath sounds: Normal breath sounds. No wheezing.  Abdominal:     General: Bowel sounds are normal. There is no distension.     Palpations: Abdomen is soft.     Tenderness: There is no abdominal tenderness.  Musculoskeletal:        General: Normal range of motion.     Cervical back: Normal range of motion and neck supple.  Skin:    General: Skin is warm and dry.  Neurological:     General: No focal deficit present.     Mental Status: She is alert.  Psychiatric:        Mood and Affect: Mood normal.      The results of significant diagnostics from this hospitalization (including imaging, microbiology, ancillary and laboratory) are listed below for reference.   Microbiology: Recent Results (from the past 240 hour(s))  Culture, blood (routine x 2)     Status: None   Collection Time: 04/25/22 11:46 AM   Specimen: BLOOD  Result Value Ref Range Status   Specimen Description   Final    BLOOD RIGHT ANTECUBITAL Performed at [facility] [address], [place], [place] [address]    Special Requests   Final    BOTTLES DRAWN AEROBIC AND ANAEROBIC Blood Culture adequate volume Performed at [facility] [address], [place], [place] [address]    Culture   Final    NO GROWTH 5 DAYS Performed at [facility], [place] [address], [place], [place] [address]    Report Status 04/30/2022 FINAL  Final  Resp panel by RT-PCR (RSV, Flu A&B, Covid) Anterior Nasal Swab     Status: None   Collection Time: 04/25/22 11:47 AM   Specimen: Anterior Nasal Swab  Result Value Ref Range Status   SARS Coronavirus 2 by RT PCR NEGATIVE NEGATIVE Final    Comment: (NOTE) SARS-CoV-2 target nucleic acids are NOT DETECTED.  The SARS-CoV-2 RNA is generally detectable in upper respiratory specimens during the acute phase of infection. The lowest concentration of SARS-CoV-2 viral copies  this assay can detect is 138 copies/mL. A negative result does not preclude SARS-Cov-2 infection and should not be used as the sole basis for treatment or other patient management decisions. A negative result may occur with  improper specimen collection/handling, submission of specimen other than nasopharyngeal swab, presence of viral mutation(s) within the areas targeted by this assay, and inadequate number of viral copies(<138 copies/mL). A negative result must be combined with clinical observations, patient history, and epidemiological information. The expected result is Negative.  Fact Sheet for Patients:  EntrepreneurPulse.com.au  Fact Sheet for Healthcare Providers:  IncredibleEmployment.be  This test is no t yet approved or cleared by the [place] FDA and  has been authorized for detection and/or diagnosis of SARS-CoV-2 by FDA under an Emergency Use Authorization (EUA). This EUA will remain  in effect (meaning this test can be used) for the duration of the COVID-19 declaration under Section 564(b)(1) of the Act, 21 U.S.C.section 360bbb-3(b)(1), unless the authorization is terminated  or revoked sooner.       Influenza A by PCR NEGATIVE NEGATIVE Final   Influenza B by PCR NEGATIVE  NEGATIVE Final    Comment: (NOTE) The Xpert Xpress SARS-CoV-2/FLU/RSV plus assay is intended as an aid in the diagnosis of influenza from Nasopharyngeal swab specimens and should not be used as a sole basis for treatment. Nasal washings and aspirates are unacceptable for Xpert Xpress SARS-CoV-2/FLU/RSV testing.  Fact Sheet for Patients: EntrepreneurPulse.com.au  Fact Sheet for Healthcare Providers: IncredibleEmployment.be  This test is not yet approved or cleared by the [place] FDA and has been authorized for detection and/or diagnosis of SARS-CoV-2 by FDA under an Emergency Use Authorization (EUA). This EUA will remain in effect (meaning this test can be used) for the duration of the COVID-19 declaration under Section 564(b)(1) of the Act, 21 U.S.C. section 360bbb-3(b)(1), unless the authorization is terminated or revoked.     Resp Syncytial Virus by PCR NEGATIVE NEGATIVE Final    Comment: (NOTE) Fact Sheet for Patients: EntrepreneurPulse.com.au  Fact Sheet for Healthcare Providers: IncredibleEmployment.be  This test is not yet approved or cleared by the [place] FDA and has been authorized for detection and/or diagnosis of SARS-CoV-2 by FDA under an Emergency Use Authorization (EUA). This EUA will remain in effect (meaning this test can be used) for the duration of the COVID-19 declaration under Section 564(b)(1) of the Act, 21 U.S.C. section 360bbb-3(b)(1), unless the authorization is terminated or revoked.  Performed at [facility], [place] [address] [place], [place] [address]   Culture, blood (routine x 2)     Status: None   Collection Time: 04/25/22 12:16 PM   Specimen: BLOOD RIGHT ARM  Result Value Ref Range Status   Specimen Description   Final    BLOOD RIGHT ARM Performed at [facility], [place] [address], [place], [place] [address]    Special Requests    Final    BOTTLES DRAWN AEROBIC AND ANAEROBIC Blood Culture results may not be optimal due to an excessive volume of blood received in culture bottles Performed at [facility] [address], [place], [place] [address]    Culture   Final    NO GROWTH 5 DAYS Performed at [facility], [place] [address], [place],  [address]    Report Status 04/30/2022 FINAL  Final  Expectorated Sputum Assessment w Gram Stain, Rflx to Resp Cult     Status: None   Collection Time: 04/25/22  9:13  PM   Specimen: Expectorated Sputum  Result Value Ref Range Status   Specimen Description EXPECTORATED SPUTUM  Final   Special Requests NONE  Final   Sputum evaluation   Final    THIS SPECIMEN IS ACCEPTABLE FOR SPUTUM CULTURE Performed at [facility], [place] [address] [place] [address] [place], [place] [address]    Report Status 04/25/2022 FINAL  Final  Culture, Respiratory w Gram Stain     Status: None   Collection Time: 04/25/22  9:13 PM  Result Value Ref Range Status   Specimen Description   Final    EXPECTORATED SPUTUM Performed at [facility] [address] [place], [place] [address]    Special Requests   Final    NONE Reflexed from [id number] Performed at [facility], [place] [address] [place], [place] [address]    Gram Stain   Final    RARE WBC PRESENT, PREDOMINANTLY PMN RARE GRAM POSITIVE COCCI    Culture   Final    Normal respiratory flora-no Staph aureus or Pseudomonas seen Performed at [facility] [address], [place], [place] [address]    Report Status 04/28/2022 FINAL  Final  Gastrointestinal Panel by PCR , Stool     Status: None   Collection Time: 04/27/22  8:05 AM   Specimen: STOOL  Result Value Ref Range Status   Campylobacter species NOT DETECTED NOT DETECTED Final   Plesimonas shigelloides NOT DETECTED NOT DETECTED Final   Salmonella species NOT DETECTED NOT DETECTED Final   Yersinia enterocolitica NOT  DETECTED NOT DETECTED Final   Vibrio species NOT DETECTED NOT DETECTED Final   Vibrio cholerae NOT DETECTED NOT DETECTED Final   Enteroaggregative E coli (EAEC) NOT DETECTED NOT DETECTED Final   Enteropathogenic E coli (EPEC) NOT DETECTED NOT DETECTED Final   Enterotoxigenic E coli (ETEC) NOT DETECTED NOT DETECTED Final   Shiga like toxin producing E coli (STEC) NOT DETECTED NOT DETECTED Final   Shigella/Enteroinvasive E coli (EIEC) NOT DETECTED NOT DETECTED Final   Cryptosporidium NOT DETECTED NOT DETECTED Final   Cyclospora cayetanensis NOT DETECTED NOT DETECTED Final   Entamoeba histolytica NOT DETECTED NOT DETECTED Final   Giardia lamblia NOT DETECTED NOT DETECTED Final   Adenovirus F40/41 NOT DETECTED NOT DETECTED Final   Astrovirus NOT DETECTED NOT DETECTED Final   Norovirus GI/GII NOT DETECTED NOT DETECTED Final   Rotavirus A NOT DETECTED NOT DETECTED Final   Sapovirus (I, II, IV, and V) NOT DETECTED NOT DETECTED Final    Comment: Performed at [facility], [address] [place], [place] [address]  C Difficile Quick Screen w PCR reflex     Status: None   Collection Time: 04/27/22  8:05 AM   Specimen: STOOL  Result Value Ref Range Status   C Diff antigen NEGATIVE NEGATIVE Final   C Diff toxin NEGATIVE NEGATIVE Final   C Diff interpretation No C. difficile detected.  Final    Comment: Performed at [facility], [place] [address], [place], [place] [address]     Labs: BNP (last 3 results) Recent Labs    04/25/22 1146  BNP 0000000*   Basic Metabolic Panel: Recent Labs  Lab 04/29/22 0636 04/30/22 0459 05/01/22 0540 05/02/22 0542  NA 143 142 141 142  K 3.4* 3.0* 4.0 3.9  CL 112* 108 107 107  CO2 21* 25 22 23   GLUCOSE 81 86 190* 105*  BUN 18 19 28* 40*  CREATININE 0.91 1.17*  1.41* 1.05*  CALCIUM 8.2* 7.8* 8.1* 8.2*  MG 1.8 2.0 2.2 2.2   Liver Function Tests: Recent Labs  Lab 05/01/22 0540  AST 34  ALT 21  ALKPHOS 46  BILITOT 0.4   PROT 6.5  ALBUMIN 2.5*   No results for input(s): "LIPASE", "AMYLASE" in the last 168 hours. No results for input(s): "AMMONIA" in the last 168 hours. CBC: Recent Labs  Lab 04/29/22 0636 04/30/22 0459 05/01/22 0540 05/02/22 0542  WBC 13.2* 12.4* 10.7* 11.7*  NEUTROABS 9.9* 9.0* 9.1* 9.2*  HGB 10.4* 10.4* 11.1* 10.2*  HCT 31.7* 32.1* 34.4* 31.0*  MCV 106.4* 106.3* 107.2* 106.5*  PLT 157 169 208 215   Cardiac Enzymes: No results for input(s): "CKTOTAL", "CKMB", "CKMBINDEX", "TROPONINI" in the last 168 hours. BNP: Invalid input(s): "POCBNP" CBG: Recent Labs  Lab 05/04/22 1434  GLUCAP 182*   D-Dimer No results for input(s): "DDIMER" in the last 72 hours. Hgb A1c No results for input(s): "HGBA1C" in the last 72 hours. Lipid Profile No results for input(s): "CHOL", "HDL", "LDLCALC", "TRIG", "CHOLHDL", "LDLDIRECT" in the last 72 hours. Thyroid function studies No results for input(s): "TSH", "T4TOTAL", "T3FREE", "THYROIDAB" in the last 72 hours.  Invalid input(s): "FREET3" Anemia work up No results for input(s): "VITAMINB12", "FOLATE", "FERRITIN", "TIBC", "IRON", "RETICCTPCT" in the last 72 hours. Urinalysis    Component Value Date/Time   COLORURINE YELLOW 04/25/2022 1231   APPEARANCEUR CLEAR 04/25/2022 1231   LABSPEC 1.017 04/25/2022 1231   PHURINE 7.0 04/25/2022 1231   GLUCOSEU 150 (A) 04/25/2022 1231   HGBUR NEGATIVE 04/25/2022 1231   BILIRUBINUR NEGATIVE 04/25/2022 1231   KETONESUR NEGATIVE 04/25/2022 1231   PROTEINUR 30 (A) 04/25/2022 1231   UROBILINOGEN 0.2 05/17/2014 1113   NITRITE NEGATIVE 04/25/2022 1231   LEUKOCYTESUR NEGATIVE 04/25/2022 1231   Sepsis Labs Recent Labs  Lab 04/29/22 0636 04/30/22 0459 05/01/22 0540 05/02/22 0542  WBC 13.2* 12.4* 10.7* 11.7*   Microbiology Recent Results (from the past 240 hour(s))  Culture, blood (routine x 2)     Status: None   Collection Time: 04/25/22 11:46 AM   Specimen: BLOOD  Result Value Ref Range  Status   Specimen Description   Final    BLOOD RIGHT ANTECUBITAL Performed at [facility], [place] [address], [place], [place] [address]    Special Requests   Final    BOTTLES DRAWN AEROBIC AND ANAEROBIC Blood Culture adequate volume Performed at [facility] [address] [place] [address] [place], [place] [address]    Culture   Final    NO GROWTH 5 DAYS Performed at [facility], [place] [address], [place], [place] [address]    Report Status 04/30/2022 FINAL  Final  Resp panel by RT-PCR (RSV, Flu A&B, Covid) Anterior Nasal Swab     Status: None   Collection Time: 04/25/22 11:47 AM   Specimen: Anterior Nasal Swab  Result Value Ref Range Status   SARS Coronavirus 2 by RT PCR NEGATIVE NEGATIVE Final    Comment: (NOTE) SARS-CoV-2 target nucleic acids are NOT DETECTED.  The SARS-CoV-2 RNA is generally detectable in upper respiratory specimens during the acute phase of infection. The lowest concentration of SARS-CoV-2 viral copies this assay can detect is 138 copies/mL. A negative result does not preclude SARS-Cov-2 infection and should not be used as the sole basis for treatment or other patient management decisions. A negative result may occur with  improper specimen collection/handling, submission of specimen other than nasopharyngeal swab, presence of viral  mutation(s) within the areas targeted by this assay, and inadequate number of viral copies(<138 copies/mL). A negative result must be combined with clinical observations, patient history, and epidemiological information. The expected result is Negative.  Fact Sheet for Patients:  EntrepreneurPulse.com.au  Fact Sheet for Healthcare Providers:  IncredibleEmployment.be  This test is no t yet approved or cleared by the [place] FDA and  has been authorized for detection and/or diagnosis of SARS-CoV-2 by FDA under an Emergency Use Authorization (EUA). This EUA  will remain  in effect (meaning this test can be used) for the duration of the COVID-19 declaration under Section 564(b)(1) of the Act, 21 U.S.C.section 360bbb-3(b)(1), unless the authorization is terminated  or revoked sooner.       Influenza A by PCR NEGATIVE NEGATIVE Final   Influenza B by PCR NEGATIVE NEGATIVE Final    Comment: (NOTE) The Xpert Xpress SARS-CoV-2/FLU/RSV plus assay is intended as an aid in the diagnosis of influenza from Nasopharyngeal swab specimens and should not be used as a sole basis for treatment. Nasal washings and aspirates are unacceptable for Xpert Xpress SARS-CoV-2/FLU/RSV testing.  Fact Sheet for Patients: EntrepreneurPulse.com.au  Fact Sheet for Healthcare Providers: IncredibleEmployment.be  This test is not yet approved or cleared by the [place] FDA and has been authorized for detection and/or diagnosis of SARS-CoV-2 by FDA under an Emergency Use Authorization (EUA). This EUA will remain in effect (meaning this test can be used) for the duration of the COVID-19 declaration under Section 564(b)(1) of the Act, 21 U.S.C. section 360bbb-3(b)(1), unless the authorization is terminated or revoked.     Resp Syncytial Virus by PCR NEGATIVE NEGATIVE Final    Comment: (NOTE) Fact Sheet for Patients: EntrepreneurPulse.com.au  Fact Sheet for Healthcare Providers: IncredibleEmployment.be  This test is not yet approved or cleared by the [place] FDA and has been authorized for detection and/or diagnosis of SARS-CoV-2 by FDA under an Emergency Use Authorization (EUA). This EUA will remain in effect (meaning this test can be used) for the duration of the COVID-19 declaration under Section 564(b)(1) of the Act, 21 U.S.C. section 360bbb-3(b)(1), unless the authorization is terminated or revoked.  Performed at [facility], [place] [address], [place], [place] [address]   Culture, blood (routine x 2)     Status: None   Collection Time: 04/25/22 12:16 PM   Specimen: BLOOD RIGHT ARM  Result Value Ref Range Status   Specimen Description   Final    BLOOD RIGHT ARM Performed at [facility], [place] [address] [place], [place] [address]    Special Requests   Final    BOTTLES DRAWN AEROBIC AND ANAEROBIC Blood Culture results may not be optimal due to an excessive volume of blood received in culture bottles Performed at [facility] [address], [place], [place] [address]    Culture   Final    NO GROWTH 5 DAYS Performed at [facility], [place] [address] [place],  [address]    Report Status 04/30/2022 FINAL  Final  Expectorated Sputum Assessment w Gram Stain, Rflx to Resp Cult     Status: None   Collection Time: 04/25/22  9:13 PM   Specimen: Expectorated Sputum  Result Value Ref Range Status   Specimen Description EXPECTORATED SPUTUM  Final   Special Requests NONE  Final   Sputum evaluation   Final    THIS SPECIMEN IS ACCEPTABLE FOR SPUTUM CULTURE Performed at [facility], [address] [place], [place] [address]    Report Status 04/25/2022 FINAL  Final  Culture, Respiratory w Gram Stain     Status: None   Collection Time: 04/25/22  9:13 PM  Result Value Ref Range Status   Specimen Description   Final    EXPECTORATED SPUTUM Performed at [place] [address], [place], [place] [address]    Special Requests   Final    NONE Reflexed from [id number] Performed at [facility], [place] [address], [place], [place] [address]    Gram Stain   Final    RARE WBC PRESENT, PREDOMINANTLY PMN RARE GRAM POSITIVE COCCI    Culture   Final    Normal respiratory flora-no Staph aureus or Pseudomonas seen Performed at [facility] [address], [place], [place] [address]    Report Status 04/28/2022 FINAL  Final  Gastrointestinal  Panel by PCR , Stool     Status: None   Collection Time: 04/27/22  8:05 AM   Specimen: STOOL  Result Value Ref Range Status   Campylobacter species NOT DETECTED NOT DETECTED Final   Plesimonas shigelloides NOT DETECTED NOT DETECTED Final   Salmonella species NOT DETECTED NOT DETECTED Final   Yersinia enterocolitica NOT DETECTED NOT DETECTED Final   Vibrio species NOT DETECTED NOT DETECTED Final   Vibrio cholerae NOT DETECTED NOT DETECTED Final   Enteroaggregative E coli (EAEC) NOT DETECTED NOT DETECTED Final   Enteropathogenic E coli (EPEC) NOT DETECTED NOT DETECTED Final   Enterotoxigenic E coli (ETEC) NOT DETECTED NOT DETECTED Final   Shiga like toxin producing E coli (STEC) NOT DETECTED NOT DETECTED Final   Shigella/Enteroinvasive E coli (EIEC) NOT DETECTED NOT DETECTED Final   Cryptosporidium NOT DETECTED NOT DETECTED Final   Cyclospora cayetanensis NOT DETECTED NOT DETECTED Final   Entamoeba histolytica NOT DETECTED NOT DETECTED Final   Giardia lamblia NOT DETECTED NOT DETECTED Final   Adenovirus F40/41 NOT DETECTED NOT DETECTED Final   Astrovirus NOT DETECTED NOT DETECTED Final   Norovirus GI/GII NOT DETECTED NOT DETECTED Final   Rotavirus A NOT DETECTED NOT DETECTED Final   Sapovirus (I, II, IV, and V) NOT DETECTED NOT DETECTED Final    Comment: Performed at [facility], [address] [place], [place] [address]  C Difficile Quick Screen w PCR reflex     Status: None   Collection Time: 04/27/22  8:05 AM   Specimen: STOOL  Result Value Ref Range Status   C Diff antigen NEGATIVE NEGATIVE Final   C Diff toxin NEGATIVE NEGATIVE Final   C Diff interpretation No C. difficile detected.  Final    Comment: Performed at [facility], [place] [address] [place], [place] [address]    Procedures/Studies: DG CHEST PORT 1 VIEW  Result Date: 04/30/2022 CLINICAL DATA:  Shortness of breath EXAM: PORTABLE CHEST 1 VIEW COMPARISON:  04/25/2022 FINDINGS:  Cardiomegaly. Pulmonary venous hypertension with mild interstitial edema. No evidence of consolidation, collapse or measurable effusion. Small amount of fluid in the minor fissure on the right. No acute bone finding. Previous right shoulder replacement and previous cervical surgery. IMPRESSION: Cardiomegaly, pulmonary venous hypertension and mild interstitial edema. Findings consistent with congestive heart failure. Electronically Signed   By: Nelson Chimes M.D.   On: 04/30/2022 10:08   VAS [place] UPPER EXTREMITY VENOUS DUPLEX  Result Date: 04/29/2022 UPPER VENOUS STUDY  Patient Name:  Ebony Scott  Date of Exam:   04/27/2022 Medical  Rec #: [id number]     Accession #:    [id number] Date of Birth: Mar 02, 1950      Patient Gender: F Patient Age:   72 years Exam Location:  [facility] Procedure:      VAS [place] UPPER EXTREMITY VENOUS DUPLEX Referring Phys: Aline August --------------------------------------------------------------------------------  Indications: Swelling Risk Factors: None identified. Limitations: Poor ultrasound/tissue interface. Comparison Study: No prior studies. Performing Technologist: Oliver Hum RVT  Examination Guidelines: A complete evaluation includes B-mode imaging, spectral Doppler, color Doppler, and power Doppler as needed of all accessible portions of each vessel. Bilateral testing is considered an integral part of a complete examination. Limited examinations for reoccurring indications may be performed as noted.  Right Findings: +----------+------------+---------+-----------+----------+-------+ RIGHT     CompressiblePhasicitySpontaneousPropertiesSummary +----------+------------+---------+-----------+----------+-------+ Subclavian    Full       Yes       Yes                      +----------+------------+---------+-----------+----------+-------+  Left Findings: +----------+------------+---------+-----------+----------+-------+ LEFT       CompressiblePhasicitySpontaneousPropertiesSummary +----------+------------+---------+-----------+----------+-------+ IJV           Full       Yes       Yes                      +----------+------------+---------+-----------+----------+-------+ Subclavian    Full       Yes       Yes                      +----------+------------+---------+-----------+----------+-------+ Axillary      Full       Yes       Yes                      +----------+------------+---------+-----------+----------+-------+ Brachial      Full       Yes       Yes                      +----------+------------+---------+-----------+----------+-------+ Radial        Full                                          +----------+------------+---------+-----------+----------+-------+ Ulnar         Full                                          +----------+------------+---------+-----------+----------+-------+ Cephalic      Full                                          +----------+------------+---------+-----------+----------+-------+ Basilic       Full                                          +----------+------------+---------+-----------+----------+-------+  Summary:  Right: No evidence of thrombosis in the subclavian.  Left: No evidence of deep vein thrombosis in the upper extremity. No evidence of superficial vein thrombosis in the upper extremity.  *See table(s) above  for measurements and observations.  Diagnosing physician: Jamelle Haring Electronically signed by Jamelle Haring on 04/29/2022 at 4:09:52 PM.    Final    DG Hand 2 View Left  Result Date: 04/27/2022 CLINICAL DATA:  History of rheumatoid arthritis. Worsening left hand pain. EXAM: LEFT HAND - 2 VIEW COMPARISON:  Left hand radiographs 03/07/2022 FINDINGS: Postsurgical changes are again seen of trapezium resection. Mild high-grade peripheral degenerative osteophytosis at the base of the thumb metacarpal. Mild distal scaphoid  peripheral degenerative osteophytosis and distal lateral subacromial cystic change. Severe index finger DIP joint and fourth finger PIP joint space narrowing with bone-on-bone contact. Moderate to large index finger PIP degenerative osteophytes. Moderate to severe third DIP, moderate index finger PIP, and mild-to-moderate fourth and fifth DIP and fifth PIP joint space narrowing and peripheral osteophytosis. Severe third and moderate second, fourth, and fifth metacarpophalangeal joint space narrowing. Mild chondrocalcinosis within the triangular fibrocartilage complex. IMPRESSION: 1. Postsurgical changes of trapezium resection. 2. 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 3. Moderate to severe third and moderate second, fourth, and fifth metacarpophalangeal joint space narrowing and peripheral osteophytosis. This can be seen with osteoarthritis or rheumatoid arthritis. Electronically Signed   By: Yvonne Kendall M.D.   On: 04/27/2022 11:08   CT ABDOMEN PELVIS WO CONTRAST  Result Date: 04/25/2022 CLINICAL DATA:  Abdominal pain EXAM: CT ABDOMEN AND PELVIS WITHOUT CONTRAST TECHNIQUE: Multidetector CT imaging of the abdomen and pelvis was performed following the standard protocol without IV contrast. RADIATION DOSE REDUCTION: This exam was performed according to the departmental dose-optimization program which includes automated exposure control, adjustment of the mA and/or kV according to patient size and/or use of iterative reconstruction technique. COMPARISON:  10/04/2020 FINDINGS: Lower chest: Moderate patchy infiltrates are seen in right lower lung fields. Small patchy infiltrates are seen in left lower lung field. There is no pleural effusion. Hepatobiliary: Surgical clips are seen in gallbladder fossa. There is no dilation of bile ducts. Pancreas: There is fatty infiltration. Spleen: Spleen is not enlarged. Adrenals/Urinary Tract: Adrenals are unremarkable. There is no hydronephrosis. There  are no renal or ureteral stones. Urinary bladder is unremarkable. Stomach/Bowel: Small hiatal hernia is seen. Small bowel loops are not dilated. Appendix is not distinctly visualized. There is no pericecal inflammation. There is diffuse wall thickening in rectum. There is stranding in the perirectal fat planes without any loculated fluid collections. Vascular/Lymphatic: Scattered calcifications are seen in aorta and its major branches including coronary arteries. Reproductive: Uterus is not seen. Other: There is no ascites or pneumoperitoneum. There are coarse calcifications in left breast. Musculoskeletal: Degenerative changes are noted in lumbar spine, more severe at L4-L5 level with spinal stenosis and encroachment of neural foramina. IMPRESSION: There is abnormal diffuse wall thickening in rectum along with perirectal stranding. Findings suggest acute proctitis. Possibility of underlying neoplastic process is not excluded. When the patient's clinical condition permits, endoscopy should be considered. There are patchy infiltrates in both lower lung fields, more so on the right side suggesting atelectasis/pneumonia. There is no evidence of intestinal obstruction or pneumoperitoneum. There is no hydronephrosis. Arteriosclerosis.  Lumbar spondylosis. Other findings as described in the body of the report. Electronically Signed   By: Elmer Picker M.D.   On: 04/25/2022 14:25   CT Head Wo Contrast  Result Date: 04/25/2022 CLINICAL DATA:  Altered mental status EXAM: CT HEAD WITHOUT CONTRAST TECHNIQUE: Contiguous axial images were obtained from the base of the skull through the vertex without intravenous contrast. RADIATION DOSE REDUCTION: This  exam was performed according to the departmental dose-optimization program which includes automated exposure control, adjustment of the mA and/or kV according to patient size and/or use of iterative reconstruction technique. COMPARISON:  02/23/2020 FINDINGS: Brain: No  acute intracranial findings are seen. There are no signs of bleeding within the cranium. Cortical sulci are prominent. There is decreased density in periventricular and subcortical white matter. Vascular: Unremarkable. Skull: No fracture is seen in calvarium. Sinuses/Orbits: There is mucosal thickening in the ethmoid sinus. Other: None. IMPRESSION: No acute intracranial findings are seen in noncontrast CT brain. Atrophy. Small-vessel disease. Electronically Signed   By: Elmer Picker M.D.   On: 04/25/2022 14:15   DG Chest Port 1 View  Result Date: 04/25/2022 CLINICAL DATA:  Fever EXAM: PORTABLE CHEST 1 VIEW COMPARISON:  X-ray 02/04/2021.  CT 04/16/2022 FINDINGS: Enlarged cardiopericardial silhouette with vascular congestion. No pneumothorax, effusion. No consolidation. Overlapping cardiac leads. Film is under penetrated. Fixation hardware along the right shoulder and cervical spine at the edge of the imaging field. IMPRESSION: Enlarged cardiopericardial silhouette with vascular congestion. Electronically Signed   By: Jill Side M.D.   On: 04/25/2022 13:10   CT CHEST HIGH RESOLUTION  Result Date: 04/17/2022 CLINICAL DATA:  Chronic cough, history of rheumatoid arthritis history of left breast cancer * Tracking Code: BO * EXAM: CT CHEST WITHOUT CONTRAST TECHNIQUE: Multidetector CT imaging of the chest was performed following the standard protocol without intravenous contrast. High resolution imaging of the lungs, as well as inspiratory and expiratory imaging, was performed. RADIATION DOSE REDUCTION: This exam was performed according to the departmental dose-optimization program which includes automated exposure control, adjustment of the mA and/or kV according to patient size and/or use of iterative reconstruction technique. COMPARISON:  08/23/2020 FINDINGS: Cardiovascular: Aortic atherosclerosis. Aortic valve calcifications. Normal heart size. Three-vessel coronary artery calcifications and or stents no  pericardial effusion. Mediastinum/Nodes: No enlarged mediastinal, hilar, or axillary lymph nodes. Thyroid gland, trachea, and esophagus demonstrate no significant findings. Lungs/Pleura: Criss Rosales appearing scarring of the bilateral lung bases with mild associated tubular bronchiectasis, similar to prior examination. No significant air trapping on expiratory phase imaging. Fine, clustered centrilobular and tree-in-bud nodularity of the posterior right upper lobe, unchanged (series 7, image 66). Minimal subpleural radiation fibrosis of the anterior left lung (series 7, image 70). No pleural effusion or pneumothorax. Upper Abdomen: No acute abnormality.  Status post cholecystectomy. Musculoskeletal: Postoperative findings of left lumpectomy. No acute osseous findings. Chronic fracture deformities of the posterior left ribs. Status post right shoulder arthroplasty. IMPRESSION: 1. Bland appearing scarring of the bilateral lung bases with mild associated tubular bronchiectasis, similar to prior examination. No findings of fibrotic interstitial lung disease. 2. Fine, clustered centrilobular and tree-in-bud nodularity of the posterior right upper lobe, unchanged and consistent with atypical infection, including atypical mycobacterium. No evidence of ongoing infection. 3. Minimal subpleural radiation fibrosis of the anterior left lung. 4. Postoperative findings of left lumpectomy. 5. Coronary artery disease. 6. Aortic valve calcifications. Correlate for echocardiographic evidence of aortic valve dysfunction. Aortic Atherosclerosis (ICD10-I70.0). Electronically Signed   By: Delanna Ahmadi M.D.   On: 04/17/2022 14:46     Time coordinating discharge: Over 30 minutes    Dwyane Dee, MD  [facility] 05/05/2022, 11:58 AM

## 2022-05-05 DIAGNOSIS — E118 Type 2 diabetes mellitus with unspecified complications: Secondary | ICD-10-CM | POA: Diagnosis not present

## 2022-05-05 DIAGNOSIS — R0602 Shortness of breath: Secondary | ICD-10-CM | POA: Diagnosis not present

## 2022-05-05 DIAGNOSIS — G894 Chronic pain syndrome: Secondary | ICD-10-CM | POA: Diagnosis not present

## 2022-05-05 DIAGNOSIS — G4733 Obstructive sleep apnea (adult) (pediatric): Secondary | ICD-10-CM | POA: Diagnosis not present

## 2022-05-05 DIAGNOSIS — I5042 Chronic combined systolic (congestive) and diastolic (congestive) heart failure: Secondary | ICD-10-CM | POA: Diagnosis not present

## 2022-05-05 DIAGNOSIS — G4736 Sleep related hypoventilation in conditions classified elsewhere: Secondary | ICD-10-CM | POA: Diagnosis not present

## 2022-05-05 DIAGNOSIS — R2681 Unsteadiness on feet: Secondary | ICD-10-CM | POA: Diagnosis not present

## 2022-05-05 DIAGNOSIS — E119 Type 2 diabetes mellitus without complications: Secondary | ICD-10-CM | POA: Diagnosis not present

## 2022-05-05 DIAGNOSIS — M797 Fibromyalgia: Secondary | ICD-10-CM | POA: Diagnosis not present

## 2022-05-05 DIAGNOSIS — M6282 Rhabdomyolysis: Secondary | ICD-10-CM | POA: Diagnosis not present

## 2022-05-05 DIAGNOSIS — J45909 Unspecified asthma, uncomplicated: Secondary | ICD-10-CM | POA: Diagnosis not present

## 2022-05-05 DIAGNOSIS — E785 Hyperlipidemia, unspecified: Secondary | ICD-10-CM | POA: Diagnosis not present

## 2022-05-05 DIAGNOSIS — J471 Bronchiectasis with (acute) exacerbation: Secondary | ICD-10-CM | POA: Diagnosis not present

## 2022-05-05 DIAGNOSIS — I5032 Chronic diastolic (congestive) heart failure: Secondary | ICD-10-CM | POA: Diagnosis not present

## 2022-05-05 DIAGNOSIS — Z7401 Bed confinement status: Secondary | ICD-10-CM | POA: Diagnosis not present

## 2022-05-05 DIAGNOSIS — M0600 Rheumatoid arthritis without rheumatoid factor, unspecified site: Secondary | ICD-10-CM | POA: Diagnosis not present

## 2022-05-05 DIAGNOSIS — J4550 Severe persistent asthma, uncomplicated: Secondary | ICD-10-CM | POA: Diagnosis not present

## 2022-05-05 DIAGNOSIS — Z9181 History of falling: Secondary | ICD-10-CM | POA: Diagnosis not present

## 2022-05-05 DIAGNOSIS — R2689 Other abnormalities of gait and mobility: Secondary | ICD-10-CM | POA: Diagnosis not present

## 2022-05-05 DIAGNOSIS — N183 Chronic kidney disease, stage 3 unspecified: Secondary | ICD-10-CM | POA: Diagnosis not present

## 2022-05-05 DIAGNOSIS — I5040 Unspecified combined systolic (congestive) and diastolic (congestive) heart failure: Secondary | ICD-10-CM | POA: Diagnosis not present

## 2022-05-05 DIAGNOSIS — M6281 Muscle weakness (generalized): Secondary | ICD-10-CM | POA: Diagnosis not present

## 2022-05-05 DIAGNOSIS — J4540 Moderate persistent asthma, uncomplicated: Secondary | ICD-10-CM | POA: Diagnosis not present

## 2022-05-05 DIAGNOSIS — R531 Weakness: Secondary | ICD-10-CM | POA: Diagnosis not present

## 2022-05-05 DIAGNOSIS — R41841 Cognitive communication deficit: Secondary | ICD-10-CM | POA: Diagnosis not present

## 2022-05-05 NOTE — Progress Notes (Signed)
1455:   Discharge report given to [place] at [facility] [phone]. Patient is being admitted to room #306.  Patient's current condition, vital signs, and history included in report.  1505Kathleen Argue [employer] [phone] called and they are setting up [facility] transport.  84:  Patient's daughter, Joseph Art, was notified via telephone message to her cell phone [phone].

## 2022-05-05 NOTE — TOC Progression Note (Addendum)
Transition of Care [facility]) - Progression Note    Patient Details  Name: Ebony Scott MRN: [id number] Date of Birth: Nov 07, 1950  Transition of Care [facility]) CM/SW Contact  Laney Bagshaw, Juliann Pulse, RN Phone Number: 05/05/2022, 11:37 AM  Clinical Narrative:   awaiting [facility] rep Freda Munro to confirm if can accept today.Once confirmed MD to update d/c summary. -1p-d/c to [facility]-sheila rep can accept. D/c summary sent. Provided Josem Kaufmann [id number] Josem Kaufmann HA:[id number]. Awaiting rm#,tel report# prior PTAR.Staff updated. -1:28p-going to rm#[id number],report tel#[phone]. PTAR called. No further CM needs. -    Expected Discharge Plan: [place] Barriers to Discharge: No Barriers Identified  Expected Discharge Plan and Services In-house Referral: NA Discharge Planning Services: CM Consult Post Acute Care Choice: NA Living arrangements for the past 2 months: Single Family Home Expected Discharge Date: 05/04/22               DME Arranged: N/A DME Agency: NA       HH Arranged: NA HH Agency: NA         Social Determinants of Health (SDOH) Interventions SDOH Screenings   Food Insecurity: No Food Insecurity (04/25/2022)  Housing: Low Risk  (04/25/2022)  Transportation Needs: No Transportation Needs (04/25/2022)  Utilities: Not At Risk (04/25/2022)  Alcohol Screen: Low Risk  (07/13/2021)  Depression (PHQ2-9): Low Risk  (07/13/2021)  Financial Resource Strain: Low Risk  (04/18/2022)  Physical Activity: Insufficiently Active (07/13/2021)  Social Connections: Moderately Isolated (07/13/2021)  Stress: No Stress Concern Present (07/13/2021)  Tobacco Use: Low Risk  (04/25/2022)    Readmission Risk Interventions    04/27/2022    2:05 PM 09/26/2020    3:55 PM  Readmission Risk Prevention Plan  Transportation Screening Complete Complete  PCP or Specialist Appt within 5-7 Days Complete   Home Care Screening Complete   Medication Review (RN CM) Referral to Pharmacy   Medication Review ([place])   Complete  PCP or Specialist appointment within 3-5 days of discharge  Complete  HRI or [place]  Complete  SW Recovery Care/Counseling Consult  Complete  [place]  Not Applicable

## 2022-05-09 ENCOUNTER — Telehealth: Payer: Self-pay | Admitting: Allergy and Immunology

## 2022-05-09 DIAGNOSIS — J4550 Severe persistent asthma, uncomplicated: Secondary | ICD-10-CM | POA: Diagnosis not present

## 2022-05-09 DIAGNOSIS — Z9181 History of falling: Secondary | ICD-10-CM | POA: Diagnosis not present

## 2022-05-09 DIAGNOSIS — R2681 Unsteadiness on feet: Secondary | ICD-10-CM | POA: Diagnosis not present

## 2022-05-09 DIAGNOSIS — I5042 Chronic combined systolic (congestive) and diastolic (congestive) heart failure: Secondary | ICD-10-CM | POA: Diagnosis not present

## 2022-05-09 DIAGNOSIS — R531 Weakness: Secondary | ICD-10-CM | POA: Diagnosis not present

## 2022-05-09 DIAGNOSIS — R2689 Other abnormalities of gait and mobility: Secondary | ICD-10-CM | POA: Diagnosis not present

## 2022-05-09 NOTE — Telephone Encounter (Signed)
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

## 2022-05-09 NOTE — Telephone Encounter (Signed)
Spoke to patient and advised her to reach out to [facility] for therapy. She may have issue with getting same while she is in nursing home so she may need to have the MD there order it

## 2022-05-10 ENCOUNTER — Other Ambulatory Visit: Payer: Self-pay | Admitting: Family Medicine

## 2022-05-11 ENCOUNTER — Telehealth (HOSPITAL_BASED_OUTPATIENT_CLINIC_OR_DEPARTMENT_OTHER): Payer: Self-pay

## 2022-05-11 MED ORDER — CLOPIDOGREL BISULFATE 75 MG PO TABS: 75.0000 mg | ORAL_TABLET | Freq: Every day | ORAL | 3 refills | Status: DC

## 2022-05-11 NOTE — Telephone Encounter (Signed)
Received fax from [place] requesting refills for Plavix 75 mg. Rx request sent to pharmacy.

## 2022-05-17 DIAGNOSIS — J4550 Severe persistent asthma, uncomplicated: Secondary | ICD-10-CM | POA: Diagnosis not present

## 2022-05-17 DIAGNOSIS — Z9181 History of falling: Secondary | ICD-10-CM | POA: Diagnosis not present

## 2022-05-17 DIAGNOSIS — R2681 Unsteadiness on feet: Secondary | ICD-10-CM | POA: Diagnosis not present

## 2022-05-17 DIAGNOSIS — I5042 Chronic combined systolic (congestive) and diastolic (congestive) heart failure: Secondary | ICD-10-CM | POA: Diagnosis not present

## 2022-05-17 DIAGNOSIS — R2689 Other abnormalities of gait and mobility: Secondary | ICD-10-CM | POA: Diagnosis not present

## 2022-05-17 DIAGNOSIS — R531 Weakness: Secondary | ICD-10-CM | POA: Diagnosis not present

## 2022-05-18 ENCOUNTER — Telehealth: Payer: Self-pay | Admitting: Family Medicine

## 2022-05-18 DIAGNOSIS — E118 Type 2 diabetes mellitus with unspecified complications: Secondary | ICD-10-CM | POA: Diagnosis not present

## 2022-05-18 DIAGNOSIS — I5040 Unspecified combined systolic (congestive) and diastolic (congestive) heart failure: Secondary | ICD-10-CM | POA: Diagnosis not present

## 2022-05-18 DIAGNOSIS — E785 Hyperlipidemia, unspecified: Secondary | ICD-10-CM | POA: Diagnosis not present

## 2022-05-18 DIAGNOSIS — N183 Chronic kidney disease, stage 3 unspecified: Secondary | ICD-10-CM | POA: Diagnosis not present

## 2022-05-18 NOTE — Telephone Encounter (Signed)
FYI:  Patient called in stating she has a bad rash and it is itching really bad. Requested for an order for some kind of cream. I advised patient providers were out of the office. I recommended that she see one of the doctors at the nursing home she is currently at and she states she had and they wouldn't give her anything. I then suggested an e-visit through [employer] She is going to try that.

## 2022-05-20 DIAGNOSIS — A419 Sepsis, unspecified organism: Secondary | ICD-10-CM | POA: Diagnosis not present

## 2022-05-20 DIAGNOSIS — N1832 Chronic kidney disease, stage 3b: Secondary | ICD-10-CM | POA: Diagnosis not present

## 2022-05-20 DIAGNOSIS — I130 Hypertensive heart and chronic kidney disease with heart failure and stage 1 through stage 4 chronic kidney disease, or unspecified chronic kidney disease: Secondary | ICD-10-CM | POA: Diagnosis not present

## 2022-05-20 DIAGNOSIS — N179 Acute kidney failure, unspecified: Secondary | ICD-10-CM | POA: Diagnosis not present

## 2022-05-20 DIAGNOSIS — E1122 Type 2 diabetes mellitus with diabetic chronic kidney disease: Secondary | ICD-10-CM | POA: Diagnosis not present

## 2022-05-20 DIAGNOSIS — D631 Anemia in chronic kidney disease: Secondary | ICD-10-CM | POA: Diagnosis not present

## 2022-05-20 DIAGNOSIS — I2510 Atherosclerotic heart disease of native coronary artery without angina pectoris: Secondary | ICD-10-CM | POA: Diagnosis not present

## 2022-05-20 DIAGNOSIS — I5043 Acute on chronic combined systolic (congestive) and diastolic (congestive) heart failure: Secondary | ICD-10-CM | POA: Diagnosis not present

## 2022-05-20 DIAGNOSIS — J189 Pneumonia, unspecified organism: Secondary | ICD-10-CM | POA: Diagnosis not present

## 2022-05-21 ENCOUNTER — Other Ambulatory Visit: Payer: Self-pay

## 2022-05-21 ENCOUNTER — Ambulatory Visit (HOSPITAL_BASED_OUTPATIENT_CLINIC_OR_DEPARTMENT_OTHER): Payer: Medicare PPO | Admitting: Family

## 2022-05-21 DIAGNOSIS — R21 Rash and other nonspecific skin eruption: Secondary | ICD-10-CM

## 2022-05-21 MED ORDER — TRIAMCINOLONE 0.1 % CREAM:EUCERIN CREAM 1:1: 1.0000 | TOPICAL_CREAM | Freq: Two times a day (BID) | CUTANEOUS | 1 refills | Status: DC

## 2022-05-21 NOTE — Progress Notes (Signed)
Office Visit Note  Patient: Ebony Scott             Date of Birth: 08/25/1950           MRN: [id number]             PCP: [name], MD Referring: [name], MD Visit Date: 05/22/2022   Subjective:  Follow-up (Patients states she has a rash all over her abdomen and neck. )   History of Present Illness: Ebony Scott is a 72 y.o. 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  Unfortunately did not see any significant benefit to joint inflammation with additional medication added.  She did get sick with community-acquired pneumonia resulting in hospitalization on March 6.  Respiratory status improved with treatment but unfortunately had a prolonged course 10 days until discharge.  Then had to go to short-term at rehab facility before returning home last weekend.  She was visited by home health physical therapy once on Easter Sunday but has not had any additional follow-up or treatment.  She feels overall weak and mobility is much worse compared to prior to hospitalization.  Also developed new erythematous skin rash involving abdomen and neck.  Previous HPI 03/07/22 Ebony Scott is a 72 y.o. 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 She has increased joint pain in multiple areas especially bilateral hands also left shoulder and right knee. She has seen only a small benefit in symptoms so far after the knee viscosupplementation shots last year. She has pain pretty much all the time both morning and night time. She notices some worsening of lateral deviation in the small finger on her right hand.    Previous HPI 11/29/21 Ebony Scott is a 72 y.o. female here for follow up for seronegative RA and cushing's syndrome currently on long term prednisone. She is on hizentra for hypogammaglobulinemia. Since last visit she had a bone marrow biopsy for macrocytosis with benign findings. She is  having an exacerbation of asthma with wheezing and coughing increased since early September and did not improve well with a prolonged steroid taper. Subsequently has not been prescribed azithromycin and higher dose steroid starting about 5 days ago so far still having a lot of coughing and symptoms. She notices increased joint pain in hands and knees during this episode. She completed a series of 3 viscosupplementation injections in the left knee about 2 weeks ago with a partial benefit.   Previous HPI 08/09/2021 Ebony Scott is a 71 y.o. female here for follow up or seronegative RA and cushing's syndrome on prednisone with gradual tapering but increased back to 7 mg daily after last visit.  She is having some increased joint pain at multiple areas but worst problem is increased pain in her left knee.  This is severe enough to limit her tolerance for weightbearing and mobility.  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  She has not had any severe interval infections.  She is scheduled for hammertoe corrective surgery next month.   Previous HPI 04/28/21 Ebony Scott is a 72 y.o. female here for follow up for seronegative RA and cushing's syndrome on prednisone with gradual tapering from 8 mg in December. She developed worsening iron deficiency anemia after GI bleed getting venofer replacement. Joint and skin problems were remaining pretty stable until about 2 weeks  ot a month ago. This was after decreasing prednisone to 6 mg daily dose. Knee pain limits her mobility but hands have the most increase in swelling. Skin rash on the back of her neck and throughout her low back. She tried topical hydrocortisone without a large improvement so far.   Previous HPI 01/23/21 Ebony Scott is a 72 y.o. female here for follow up for seronegative RA minimally responsive to multiple DMARD treatments on prednisone 10 mg PO daily with acquired cushing's syndrome. She has noticed  some increase in right hand MCP joint swelling but overall joints not dramatically changed with dose reduction. Since our visit she went for coronary artery stent placement after study for evaluating cardiomyopathy found significant stenosis. No anginal symptoms, she is not sure if any different but husband reports noticing some improvement in her dyspnea on exertion. She has had numerous other issues increased upper airway congestion and drainage. Sometimes productive coughing with this. She has had mild nasal bleeding with rhinitis and and sneezing issues.   Previous HPI 11/28/20 Ebony Scott is a 72 y.o. female here for iatrogenic cushing syndrome and history of rheumatoid arthritis with long term prednisone treatment, previously seen by Dr. Deanne Coffer at [facility] for this problem. Mostly had been taking treatments after hospitalization with severe joint inflammation in 2020. She has tried taking numerous DMARD treatments for years including actemra, simponi, abatacept, and rituximab so far without great response or tolerance. Chronic prednisone required for symptoms throughout this time but has taken these longstanding for asthma disease already. She has had some issue with recurrent infections with pulmonary and septic bursitis and more recently noninfectious allergy symptoms managed with Dr. Lucie Leather and Francine Graven. She started on IVIG treatments with Dr. Lucie Leather for this identified recently, not sure if primary or related to previous medication   DMARD Hx Rituximab Abatacept Simponi Actemra IV   Review of Systems  Constitutional:  Positive for fatigue.  HENT:  Positive for mouth sores and mouth dryness.   Eyes:  Positive for dryness.  Respiratory:  Positive for shortness of breath.   Cardiovascular:  Negative for chest pain and palpitations.  Gastrointestinal:  Positive for diarrhea. Negative for blood in stool and constipation.  Endocrine: Negative for increased urination.  Genitourinary:  Negative for  involuntary urination.  Musculoskeletal:  Positive for joint pain, gait problem, joint pain, joint swelling, myalgias, muscle weakness, muscle tenderness and myalgias. Negative for morning stiffness.  Skin:  Positive for rash. Negative for color change, hair loss and sensitivity to sunlight.  Allergic/Immunologic: Positive for susceptible to infections.  Neurological:  Positive for headaches. Negative for dizziness.  Hematological:  Negative for swollen glands.  Psychiatric/Behavioral:  Positive for sleep disturbance. Negative for depressed mood. The patient is not nervous/anxious.     PMFS History:  Patient Active Problem List   Diagnosis Date Noted   Abnormal CT of the abdomen 04/26/2022   Itching of ear 04/10/2022   Effusion, left knee 08/09/2021   Noninfectious otitis externa of left ear 06/08/2021   Rash and other nonspecific skin eruption 04/28/2021   Iron deficiency anemia 02/24/2021   GAVE (gastric antral vascular ectasia)    Symptomatic anemia 02/04/2021   Anticoagulated    Antiplatelet or antithrombotic long-term use    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   Coronary artery calcification seen on CAT scan 01/11/2021   ARF (acute renal failure) (HCC) 10/04/2020   Physical deconditioning 09/25/2020   History of DVT (deep vein thrombosis) 09/24/2020   DMII (  diabetes mellitus, type 2) (HCC) 09/23/2020   Cellulitis of right hand 09/22/2020   Abscess of dorsum of right hand 09/22/2020   Hoarseness 09/09/2020   Metabolic encephalopathy 02/24/2020   Acute encephalopathy    AMS (altered mental status) 02/23/2020   S/P reverse total shoulder arthroplasty, right    Hypokalemia    DOE (dyspnea on exertion) 02/18/2020   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   S/P reverse total shoulder arthroplasty, left 02/16/2020   Preoperative clearance 01/26/2020   Closed displaced fracture of metatarsal bone of right foot 01/20/2019   History of pulmonary embolus (PE)  12/19/2018   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   Rheumatoid arthritis (HCC)    Bilateral impacted cerumen 08/18/2018   Fungal otitis externa 08/18/2018   Cerumen debris on tympanic membrane of right ear 08/18/2018   Asthmatic bronchitis with exacerbation 07/29/2018   Pulmonary embolism (HCC) 07/29/2018   Cellulitis of forearm    Fever of unknown origin (FUO) 02/10/2018   Abdominal pain 02/10/2018   Fracture of base of fifth metatarsal bone with routine healing, left    Closed fracture of fifth metatarsal bone of right foot, initial encounter 01/15/2018   DVT (deep venous thrombosis) (HCC) 01/14/2018   Hypothyroidism 01/14/2018   Depression 01/14/2018   Debility    Protein-calorie malnutrition, moderate (HCC) 12/17/2017   Knee pain    History of breast cancer    Chronic pain syndrome    Fibromyalgia    Graves disease    Vasculitis (HCC) 12/13/2017   Rhabdomyolysis 12/13/2017   Dysphonia 08/10/2017   Pulmonary nodules    Chronic kidney disease, stage 3b (HCC) 04/15/2016   Hx of adenomatous colonic polyps 04/12/2016   Cough 02/09/2016   Opacity of lung on imaging study 11/03/2015   Severe persistent asthma 02/08/2015   Facial pain syndrome 02/08/2015   Insomnia 02/08/2015   Other allergic rhinitis 02/08/2015   LPRD (laryngopharyngeal reflux disease) 02/08/2015   Acute on chronic combined systolic and diastolic CHF (congestive heart failure) (HCC) 02/08/2015   Diastolic dysfunction 02/08/2015   Benign paroxysmal positional vertigo 12/03/2012   Dizziness and giddiness 10/29/2012   Disequilibrium 10/29/2012   Pseudogout 02/24/2012   Irritable bowel syndrome 01/02/2010   Hyperlipidemia 12/23/2009   Hyperthyroidism 11/12/2006   Seasonal and perennial allergic rhinitis 11/12/2006   GERD 11/12/2006   NECK PAIN, CHRONIC 11/12/2006   OSTEOPOROSIS 11/12/2006   BREAST CANCER, HX OF 11/12/2006    Past Medical History:  Diagnosis Date   Abscess of dorsum of right hand 09/22/2020    Acute deep vein thrombosis (DVT) of right lower extremity (HCC) 12/13/2017   Allergic rhinitis    Anemia    Angio-edema    Anxiety    pt denies dx   Arthritis    Phreesia 10/21/2019   Asthma    Phreesia 10/21/2019   Breast cancer (HCC) 1998   Left breast, in remission   Bronchiectasis (HCC)    Cataract    REMOVED   CHF (congestive heart failure) (HCC)    Clostridium difficile colitis 01/14/2018   Complication of anesthesia    "had hard time waking up from it several times" (02/20/2012), no anesthesia problems since 2014   Coronary artery disease    Depression    "some; don't take anything for it" (02/20/2012), pt denies dx as of 06/02/21   Diverticulosis    DVT (deep venous thrombosis) (HCC)    Dyspnea    all the time   Fibromyalgia 11/2011   GAVE (gastric antral vascular  ectasia)    GERD (gastroesophageal reflux disease)    Graves disease    Headache(784.0)    "related to allergies; more at different times during the year" (02/20/2012)   Hemorrhoids    Hiatal hernia    back and neck   History of blood transfusion 2023   1 unit, 5 units of Iron   Hx of adenomatous colonic polyps 04/12/2016   Hypercholesteremia    good cholesterol is high   Hypothyroidism    IBS (irritable bowel syndrome)    Moderate persistent asthma    -FeV1 72% 2011, -IgE 102 2011, CT sinus Neg 2011   Osteomyelitis of second toe of right foot (HCC) 09/23/2020   Osteoporosis    on reclast yearly   Peripheral vascular disease (HCC) 2019   DVTs (3) lungs and leg   Pneumonia 04/2011; ~ 11/2011   "double; single" (02/20/2012)   Pneumonia    Septic olecranon bursitis of right elbow 09/22/2020   Seronegative rheumatoid arthritis (HCC)    Dr. Casimer Lanius   SIRS (systemic inflammatory response syndrome) (HCC) 02/10/2018   Sleep apnea    uses cpap nightly   Tracheobronchomalacia     Family History  Problem Relation Age of Onset   Allergies Mother    Heart disease Mother    Arthritis Mother    Lung  cancer Mother        heavy smoker   Diabetes Mother    Allergies Father    Heart disease Father    Arthritis Father    Stroke Father    Heart disease Brother    Diabetes Maternal Grandfather    Colitis Daughter    Colon cancer Other        Maternal half uncle   Past Surgical History:  Procedure Laterality Date   ABDOMINAL HYSTERECTOMY N/A    Phreesia 10/21/2019   ANTERIOR AND POSTERIOR REPAIR  1990's   APPENDECTOMY     ARGON LASER APPLICATION  02/05/2021   Procedure: ARGON LASER APPLICATION;  Surgeon: Benancio Deeds, MD;  Location: [facility];  Service: Gastroenterology;;   BREAST LUMPECTOMY  1998   left   BREAST SURGERY N/A    Phreesia 10/21/2019   BRONCHIAL WASHINGS  04/05/2020   Procedure: BRONCHIAL WASHINGS;  Surgeon: Martina Sinner, MD;  Location: [facility];  Service: Pulmonary;;   CAPSULOTOMY Right 08/28/2021   Procedure: CAPSULOTOMY;  Surgeon: Candelaria Stagers, DPM;  Location: [facility];  Service: Podiatry;  Laterality: Right;   CARPOMETACARPEL (CMC) FUSION OF THUMB WITH AUTOGRAFT FROM RADIUS  ~ 2009   "both thumbs" (02/20/2012)   CATARACT EXTRACTION W/ INTRAOCULAR LENS  IMPLANT, BILATERAL  2012   CERVICAL DISCECTOMY  10/2001   C5-C6   CERVICAL FUSION  2003   C3-C4   CHOLECYSTECTOMY     COLONOSCOPY     CORONARY STENT INTERVENTION N/A 01/11/2021   Procedure: CORONARY STENT INTERVENTION;  Surgeon: Marykay Lex, MD;  Location: [facility] INVASIVE CV LAB;  Service: Cardiovascular;  Laterality: N/A;   DEBRIDEMENT TENNIS ELBOW  ?1970's   right   ESOPHAGOGASTRODUODENOSCOPY     ESOPHAGOGASTRODUODENOSCOPY (EGD) WITH PROPOFOL N/A 02/05/2021   Procedure: ESOPHAGOGASTRODUODENOSCOPY (EGD) WITH PROPOFOL;  Surgeon: Benancio Deeds, MD;  Location: [facility];  Service: Gastroenterology;  Laterality: N/A;   EYE SURGERY N/A    Phreesia 10/21/2019   HAMMER TOE SURGERY Right 08/28/2021   Procedure: HAMMER TOE REPAIR 2ND TOE RIGHT FOOT;  Surgeon: Candelaria Stagers, DPM;   Location: [facility];  Service: Podiatry;  Laterality: Right;   HYSTERECTOMY     I & D EXTREMITY Right 09/22/2020   Procedure: IRRIGATION AND DEBRIDEMENT RIGHT HAND AND ELBOW;  Surgeon: Teryl Lucy, MD;  Location: [facility];  Service: Orthopedics;  Laterality: Right;   KNEE ARTHROPLASTY  ?1990's   "?right; w/cartilage repair" (02/20/2012)   MASS EXCISION Left 08/28/2021   Procedure: EXCISION OF SOFT TISSUE  MASS LEFT FOOT;  Surgeon: Candelaria Stagers, DPM;  Location: [facility];  Service: Podiatry;  Laterality: Left;   NASAL SEPTUM SURGERY  1980's   POSTERIOR CERVICAL FUSION/FORAMINOTOMY  2004   "failed initial fusion; rewired  anterior neck" (02/20/2012)   REVERSE SHOULDER ARTHROPLASTY Right 02/16/2020   Procedure: REVERSE SHOULDER ARTHROPLASTY;  Surgeon: Teryl Lucy, MD;  Location: [facility];  Service: Orthopedics;  Laterality: Right;   RIGHT/LEFT HEART CATH AND CORONARY ANGIOGRAPHY N/A 01/11/2021   Procedure: RIGHT/LEFT HEART CATH AND CORONARY ANGIOGRAPHY;  Surgeon: Marykay Lex, MD;  Location[facility] CV LAB;  Service: Cardiovascular;  Laterality: N/A;   SPINE SURGERY N/A    Phreesia 10/21/2019   TONSILLECTOMY  ~ 1953   VESICOVAGINAL FISTULA CLOSURE W/ TAH  1988   VIDEO BRONCHOSCOPY Bilateral 08/23/2016   Procedure: VIDEO BRONCHOSCOPY WITH FLUORO;  Surgeon: Roslynn Amble, MD;  Location: [facility];  Service: Cardiopulmonary;  Laterality: Bilateral;   VIDEO BRONCHOSCOPY N/A 04/05/2020   Procedure: VIDEO BRONCHOSCOPY WITHOUT FLUORO;  Surgeon: Martina Sinner, MD;  Location: [facility];  Service: Pulmonary;  Laterality: N/A;   Social History   Social History Narrative   Patient lives at home alone. Patient  divorced.    Patient has her BS degree.   Right handed.   Caffeine- sometimes coffee.      [facility]:   Born in [place], [place]. She worked as a Armed forces operational officer. She has no pets currently. She does have indoor plants. Previously had mold in her home that was remediated.  Carpet was removed.          Immunization History  Administered Date(s) Administered   Covid-19, Mrna,Vaccine(Spikevax)66yrs and older 01/08/2022   DTaP 08/18/2013   Fluad Quad(high Dose 65+) 10/16/2018, 11/20/2019, 11/03/2020   Hepatitis A 09/04/2007, 03/02/2008   Hepatitis B 01/07/1985, 02/06/1985, 08/17/1985   Influenza Split 11/13/2010, 11/22/2011, 10/20/2012   Influenza Whole 11/14/2009, 11/21/2011   Influenza, High Dose Seasonal PF 11/09/2015, 11/27/2017   Influenza, Quadrivalent, Recombinant, Inj, Pf 11/20/2019   Influenza,inj,Quad PF,6+ Mos 11/06/2013, 11/09/2014, 11/06/2016   Influenza-Unspecified 11/06/2021   Meningococcal Conjugate 09/04/2007   PFIZER(Purple Top)SARS-COV-2 Vaccination 03/12/2019, 04/01/2019, 12/16/2019, 06/29/2020   Pfizer Covid-19 Vaccine Bivalent Booster 23yrs & up 03/02/2021   Pneumococcal Conjugate-13 05/18/2013   Pneumococcal Polysaccharide-23 01/05/1994, 11/28/2011, 05/27/2017   Respiratory Syncytial Virus Vaccine,Recomb Aduvanted(Arexvy) 11/06/2021   Td 07/24/1995, 03/16/2005   Tdap 08/18/2013   Zoster Recombinat (Shingrix) 09/18/2017   Zoster, Live 03/02/2008     Objective: Vital Signs: BP 131/64 (BP Location: Right Arm, Patient Position: Sitting, Cuff Size: Normal)   Pulse 85   Resp 14   Ht 5\' 1"  (1.549 m)   Wt 125 lb (56.7 kg)   BMI 23.62 kg/m    Physical Exam Constitutional:      Comments: Chronically ill-appearing  Eyes:     Conjunctiva/sclera: Conjunctivae normal.  Cardiovascular:     Rate and Rhythm: Normal rate and regular rhythm.  Pulmonary:     Effort: Pulmonary effort is normal.     Comments: Partially decreased left lower quadrant breath sounds, bibasilar  crackles Lymphadenopathy:     Cervical: No cervical adenopathy.  Skin:    General: Skin is warm and dry.     Findings: Bruising present.  Neurological:     Mental Status: She is alert.      Musculoskeletal Exam:  Shoulder guarding against abduction on both  sides, left shoulder with decreased range of motion also tenderness to palpation at neutral position Elbows full ROM no tenderness or swelling Wrists full ROM no tenderness or swelling Fingers tender at multiple sites with MCP swelling, worst at left fourth finger throughout and at the right fifth PIP swelling and lateral deviation Knees full ROM no tenderness or swelling patellofemoral crepitus present b/l  Investigation: No additional findings.  Imaging: No results found.  Recent Labs: Lab Results  Component Value Date   WBC 10.3 06/14/2022   HGB 10.1 (L) 06/14/2022   PLT 224 06/14/2022   NA 142 06/14/2022   K 4.3 06/14/2022   CL 110 06/14/2022   CO2 24 06/14/2022   GLUCOSE 136 (H) 06/14/2022   BUN 19 06/14/2022   CREATININE 1.14 (H) 06/14/2022   BILITOT 0.6 06/14/2022   ALKPHOS 60 06/14/2022   AST 31 06/14/2022   ALT 29 06/14/2022   PROT 6.5 06/14/2022   ALBUMIN 3.7 06/14/2022   CALCIUM 9.4 06/14/2022   GFRAA 40 (L) 08/25/2020    Speciality Comments: No specialty comments available.  Procedures:  Large Joint Inj: L subacromial bursa on 05/22/2022 3:40 PM Indications: pain Details: 27 G 1.5 in needle, lateral approach Medications: 3 mL lidocaine 1 %; 40 mg triamcinolone acetonide 40 MG/ML Outcome: tolerated well, no immediate complications Procedure, treatment alternatives, risks and benefits explained, specific risks discussed. Consent was given by the patient. Immediately prior to procedure a time out was called to verify the correct patient, procedure, equipment, support staff and site/side marked as required. Patient was prepped and draped in the usual sterile fashion.     Allergies: Baclofen, Dust mite extract, Molds & smuts, Morphine and related, Other, Penicillins, Rofecoxib, Shellfish allergy, Shrimp extract, Tetracycline hcl, Tetracycline hcl, Xolair [omalizumab], Zoledronic acid, Dilaudid [hydromorphone hcl], Hydrocodone-acetaminophen, Levofloxacin, Oxycodone  hcl, Oxycodone hcl, Paroxetine, Celecoxib, Diltiazem hcl, Lactose, Lactose intolerance (gi), Oxycodone-acetaminophen, Rituximab, Tree extract, Gamunex [immune globulin], and Penicillin g procaine   Assessment / Plan:     Visit Diagnoses: Rheumatoid arthritis with negative rheumatoid factor, involving unspecified site (HCC) - Plan: Large Joint Inj: L subacromial bursa  Seropositive RA with significant continued activity but challenging to treat with immunosuppressive medication due to existing immunodeficiency and recent severe infection with complications.  Will discontinue hydroxychloroquine due to lack of benefit not sure if it was related to any of the recent problems though.  Plan to continue on prednisone 7 mg daily for arthritis.  Also for left shoulder subacromial bursa injection today for ongoing shoulder pain and limited mobility.  Rash and other nonspecific skin eruption - Plan: DISCONTINUED: triamcinolone ointment (KENALOG) 0.1 %  Timeline does not sound very consistent with medication associated rash suspect more related to recent infection and hospital course and medications.  Also prescribing triamcinolone 0.1% to apply on affected areas up to twice daily.  Orders: Orders Placed This Encounter  Procedures   Large Joint Inj: L subacromial bursa   Meds ordered this encounter  Medications   DISCONTD: triamcinolone ointment (KENALOG) 0.1 %    Sig: Apply 1 Application topically 2 (two) times daily as needed.    Dispense:  80 g    Refill:  0     Follow-Up Instructions: Return in about 3 months (around 08/21/2022) for RA on GC/inj f/u 3mos.   Fuller Plan, MD  Note - This record has been created using [employer].  Chart creation errors have been sought, but may not always  have been located. Such creation errors do not reflect on  the standard of medical care.

## 2022-05-21 NOTE — Progress Notes (Deleted)
Office Visit    Patient Name: Ebony Scott Date of Encounter: 05/21/2022  PCP:  [name], MD   [place]  Cardiologist:  Buford Dresser, MD  Advanced Practice Provider:  No care team member to display Electrophysiologist:  None     Chief Complaint    Ebony Scott is a 72 y.o. female presents today for hospital follow up.   Past Medical History    Past Medical History:  Diagnosis Date   Abscess of dorsum of right hand 09/22/2020   Acute deep vein thrombosis (DVT) of right lower extremity ([place]) 12/13/2017   Allergic rhinitis    Anemia    Angio-edema    Anxiety    pt denies dx   Arthritis    Phreesia 10/21/2019   Asthma    Phreesia 10/21/2019   Breast cancer ([place]) 1998   Left breast, in remission   Bronchiectasis ([place])    Cataract    REMOVED   CHF (congestive heart failure) ([place])    Clostridium difficile colitis [id number]   Complication of anesthesia    "had hard time waking up from it several times" (02/20/2012), no anesthesia problems since 2014   Coronary artery disease    Depression    "some; don't take anything for it" (02/20/2012), pt denies dx as of 06/02/21   Diverticulosis    DVT (deep venous thrombosis) ([place])    Dyspnea    all the time   Fibromyalgia 11/2011   GAVE (gastric antral vascular ectasia)    GERD (gastroesophageal reflux disease)    Graves disease    Headache(784.0)    "related to allergies; more at different times during the year" (02/20/2012)   Hemorrhoids    Hiatal hernia    back and neck   History of blood transfusion 2023   1 unit, 5 units of Iron   Hx of adenomatous colonic polyps 04/12/2016   Hypercholesteremia    good cholesterol is high   Hypothyroidism    IBS (irritable bowel syndrome)    Moderate persistent asthma    -FeV1 72% 2011, -IgE 102 2011, CT sinus Neg 2011   Osteomyelitis of second toe of right foot ([place]) 09/23/2020   Osteoporosis    on reclast yearly   Peripheral vascular  disease ([place]) 2019   DVTs (3) lungs and leg   Pneumonia 04/2011; ~ 11/2011   "double; single" (02/20/2012)   Septic olecranon bursitis of right elbow 09/22/2020   Seronegative rheumatoid arthritis ([place])    Dr. Lahoma Rocker   SIRS (systemic inflammatory response syndrome) ([place]) 02/10/2018   Sleep apnea    uses cpap nightly   Tracheobronchomalacia    Past Surgical History:  Procedure Laterality Date   ABDOMINAL HYSTERECTOMY N/A    Phreesia 10/21/2019   ANTERIOR AND POSTERIOR REPAIR  1990's   APPENDECTOMY     ARGON LASER APPLICATION  [id number]   Procedure: ARGON LASER APPLICATION;  Surgeon: Yetta Flock, MD;  Location: [facility];  Service: Gastroenterology;;   BREAST LUMPECTOMY  1998   left   BREAST SURGERY N/A    Phreesia 10/21/2019   BRONCHIAL WASHINGS  04/05/2020   Procedure: BRONCHIAL WASHINGS;  Surgeon: Freddi Starr, MD;  Location: [facility];  Service: Pulmonary;;   CAPSULOTOMY Right 08/28/2021   Procedure: CAPSULOTOMY;  Surgeon: Felipa Furnace, DPM;  Location: [facility];  Service: Podiatry;  Laterality: Right;   CARPOMETACARPEL ([place]) FUSION OF THUMB WITH AUTOGRAFT FROM RADIUS  ~ 2009   "  both thumbs" (02/20/2012)   CATARACT EXTRACTION W/ INTRAOCULAR LENS  IMPLANT, BILATERAL  2012   CERVICAL DISCECTOMY  10/2001   C5-C6   CERVICAL FUSION  2003   C3-C4   CHOLECYSTECTOMY     COLONOSCOPY     CORONARY STENT INTERVENTION N/A 01/11/2021   Procedure: CORONARY STENT INTERVENTION;  Surgeon: Leonie Man, MD;  Location: [facility];  Service: Cardiovascular;  Laterality: N/A;   DEBRIDEMENT TENNIS ELBOW  ?1970's   right   ESOPHAGOGASTRODUODENOSCOPY     ESOPHAGOGASTRODUODENOSCOPY (EGD) WITH PROPOFOL N/A 02/05/2021   Procedure: ESOPHAGOGASTRODUODENOSCOPY (EGD) WITH PROPOFOL;  Surgeon: Yetta Flock, MD;  [facility];  Service: Gastroenterology;  Laterality: N/A;   EYE SURGERY N/A    Phreesia 10/21/2019   HAMMER TOE SURGERY Right 08/28/2021    Procedure: HAMMER TOE REPAIR 2ND TOE RIGHT FOOT;  Surgeon: Felipa Furnace, DPM;  Location: [facility];  Service: Podiatry;  Laterality: Right;   HYSTERECTOMY     I & D EXTREMITY Right 09/22/2020   Procedure: IRRIGATION AND DEBRIDEMENT RIGHT HAND AND ELBOW;  Surgeon: Marchia Bond, MD;  Location: [facility];  Service: Orthopedics;  Laterality: Right;   KNEE ARTHROPLASTY  ?1990's   "?right; w/cartilage repair" (02/20/2012)   MASS EXCISION Left 08/28/2021   Procedure: EXCISION OF SOFT TISSUE  MASS LEFT FOOT;  Surgeon: Felipa Furnace, DPM;  Location: [facility];  Service: Podiatry;  Laterality: Left;   NASAL SEPTUM SURGERY  1980's   POSTERIOR CERVICAL FUSION/FORAMINOTOMY  2004   "failed initial fusion; rewired  anterior neck" (02/20/2012)   REVERSE SHOULDER ARTHROPLASTY Right 02/16/2020   Procedure: REVERSE SHOULDER ARTHROPLASTY;  Surgeon: Marchia Bond, MD;  [facility]: [facility];  Service: Orthopedics;  Laterality: Right;   RIGHT/LEFT HEART CATH AND CORONARY ANGIOGRAPHY N/A 01/11/2021   Procedure: RIGHT/LEFT HEART CATH AND CORONARY ANGIOGRAPHY;  Surgeon: Leonie Man, MD;  Location: [facility];  Service: Cardiovascular;  Laterality: N/A;   SPINE SURGERY N/A    Phreesia 10/21/2019   TONSILLECTOMY  ~ 1953   VESICOVAGINAL FISTULA CLOSURE W/ TAH  1988   VIDEO BRONCHOSCOPY Bilateral 08/23/2016   Procedure: VIDEO BRONCHOSCOPY WITH FLUORO;  Surgeon: Javier Glazier, MD;  [facility];  Service: Cardiopulmonary;  Laterality: Bilateral;   VIDEO BRONCHOSCOPY N/A 04/05/2020   Procedure: VIDEO BRONCHOSCOPY WITHOUT FLUORO;  Surgeon: Freddi Starr, MD;  [facility];  Service: Pulmonary;  Laterality: N/A;    Allergies  Allergies  Allergen Reactions   Baclofen Anaphylaxis and Other (See Comments)    Altered mental status requiring a 3-day hospital stay- patient became unresponsive   "Put me in a coma for five days", Altered mental status requiring a 3-day hospital stay- patient became  unresponsive   Dust Mite Extract Shortness Of Breath and Other (See Comments)    "sneezing" (02/20/2012) too   Molds & Smuts Shortness Of Breath   Morphine And Related Hives and Itching   Other Shortness Of Breath and Other (See Comments)    Grass and weeds "sneezing; filled sinuses" (02/20/2012)   Penicillins Rash and Other (See Comments)    "welts" (02/20/2012)  Tolerated Ancef 02/16/20   Rofecoxib Swelling and Other (See Comments)    Vioxx- feet swelling   Shellfish Allergy Anaphylaxis, Shortness Of Breath, Itching and Rash   Shrimp Extract Anaphylaxis and Other (See Comments)    ALL SHELLFISH   Tetracycline Hcl Nausea And Vomiting   Tetracycline Hcl Other (See Comments)    GI  Intolerance   Xolair [Omalizumab] Other (See Comments)    Caused Blood clot   Zoledronic Acid Other (See Comments)    Reclast- Fever, Put in hospital, dr said it was a reaction from a reaction    Fever, inability to walk, Put in hospital  Fever, Put in hospital, dr said it was a reaction from a reaction , Other reaction(s): Unknown   Dilaudid [Hydromorphone Hcl] Itching   Hydrocodone-Acetaminophen Itching   Levofloxacin Other (See Comments)    GI upset  Other Reaction(s): GI Intolerance, Other (See Comments)  REACTION: GI upset, GI upset   Oxycodone Hcl Nausea And Vomiting   Oxycodone Hcl Other (See Comments)    GI Intolerance   Paroxetine Nausea And Vomiting and Other (See Comments)    GI Intolerance also   Celecoxib Swelling and Other (See Comments)    Feet swelling   Diltiazem Hcl Swelling   Lactose Other (See Comments)    Bloating and gas   Lactose Intolerance (Gi) Other (See Comments)    Bloating and gas   Oxycodone-Acetaminophen Itching   Rituximab Other (See Comments)    Anemia    Tree Extract Other (See Comments)    "tested and told I was allergic to it; never experienced a reaction to it" (02/20/2012)   Gamunex [Immune Globulin] Itching and Rash    Tolerates hizentra    Penicillin G  Procaine Rash and Other (See Comments)    Welts, also    History of Present Illness    JONTE REINSCHMIDT is a 72 y.o. 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   CT chest June 2019 with diffuse calcification including aorta, aortic branch vessels, coronary arteries.  Normal Lexiscan in the past.   She was hospitalized July 2022 with acute renal failure, acute systolic and diastolic heart failure.  Echocardiogram showing new onset heart failure with LVEF 35-40%, wall motion abnormalities, LV moderately dilated, grade 2 diastolic dysfunction, elevated LVEDP, mild MR, moderate calcification of aortic valve without stenosis.   She was seen in follow-up 10/26/20.  [place], ACE/ARB/Arni/MRA were on hold due to recent kidney injury.  Cardiac catheterization was held due to kidney dysfunction. She was started on half tablet of Bidil 20-27.5mg  BID. She was encouraged to take Lasix 40mg  QD as prescribed.     She was started on weekly injections of IVIG by Dr. Neldon Mc for immunodeficiency. PCP reduced Lasix to 20mg  QD with 11/28/20 labs show K 4.2, creatinine 1.19, GFR 49, AST 48, ALT 48. Kidney function back to previous baseline.  '  She was seen in follow up 11/30/20 noting increased fatigue, stable dyspnea on exertion. Jardiance 10mg  QD was started for optimization of HF therapy. Cardiac catheterization was scheduled due to reduced LVEF.   She underwent LHC 01/11/21 with DES to LAD. Discharged on Aspirin 81mg  QD x 1 month, Plavix 75mg  QD x 6 mos, and indefinite Xarelto due to hx of PE and DVT. She had moderate pulmonary hypertension and moderate to severely reduced cardiac function . She was recommended for Lasix 40mg  BID x 3 days then  40mg  QD.   She was seen 01/19/21 and Lasix increased to 40mg  QD as weight up 4 pounds. She noted significant bruising after cath but this was improving.  She was not taking her Isosorbide  dinitrate-Hydralazine with plan to monitor BP at home and report in 2 weeks. Labs showed Hb 8.6, stable renal function, BNP 621.2. Repeat CBC in 2 weeks for recommended. 02/04/21 Hb 6.4 and she was recommended to present to the ED for evaluation.   She presents today for follow up after contacting office noting increased bruising at right groin venous access site. Reports no shortness of breath and stable dyspnea on exertion. Reports no chest pain, pressure, or tightness. No  orthopnea, PND. Reports no palpitations.  She completed Lasix 40mg  BID and has since been taking 20mg  QD. Weighing sporadically at home. Weight today up 4 lbs compared to previous. Tells me her bruising is improved. She removed her bandage a few days ago which had dry blood but no new bleeding. Soreness has improved and she is moving more. Tells me she has a "sore" in her nose which she is treating with neosporin and concerned about nasal congestion with possible sinus infection, no reported fever, encouraged to discuss with primary care.   Admitted 02/04/21-02/07/21 and required blood transfusion. Aspirin/Plavix/Xarelto held initially and Gi consulted. 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 Plavix resumed. Xarelto was also able to be resumed.   She presents today for follow up. Notes persistent fatigue with minimal activity since discharge. She is working with [facility] PT and hoping to establish with [facility] OT. Reports no shortness of breath at rest though notes dyspnea on exertion. Reports no chest pain, pressure, or tightness. No edema, orthopnea, PND. Reports no palpitations.  Weights at home have been stable. BP consistently <130/80 at home. Reports no melena, hematuria. We discussed that given her recent admission with anemia  would expect her energy level to gradually increase.  EKGs/Labs/Other Studies Reviewed:   The following studies were reviewed today:  [facility] 01/11/21  Prox LAD lesion is 95% stenosed proximal to 1st Diag &  Mid LAD lesion is 90% stenosed following   Mid LAD lesion is 90% stenosed.   A drug-eluting stent was successfully placed covering both lesions using a STENT ONYX FRONTIER 2.25X34.   Post intervention, there is a 0% residual stenosis.   --------------------------------------------------------------   Hemodynamic findings consistent with moderate pulmonary hypertension.   There is no aortic valve stenosis.   SUMMARY Severe single-vessel CAD with 95% and 90% stenoses of the proximal and mid LAD at major 1st Diag branch.  Likely be culprit for reduced EF and anterior hypokinesis on echo. Successful DES PCI of the LAD using an Onyx Frontier DES 2.25 mm x 34 mm postdilated in tapered fashion from 2.6 to 2.32mm.   TIMI III flow pre and post, reduced to 0% Otherwise minimal CAD in the Right dominant system. 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 Moderate to severely reduced cardiac function with Cardiac Output-Index of 4.05-2.59 by Fick and 3.75-2.40 by Thermal Dilution.   RECOMMENDATIONS Continue to titrate medical management of chronic Systolic and diastolic heart failure.  She was given 40 mg IV Lasix on the Cath Lab table.  Recommend that she takes 40 mg twice daily oral Lasix for 3 days postprocedure.   She will need close follow-up with either primary cardiologist or APP to initiate titration of medications. Clinically stable for d/c today   Echo 09/16/20 1. Septal apical and anterior wall hypokinesis EF decreased compard to TTE done 07/10/19 . Left ventricular ejection fraction, by estimation,  is 35 to 40%. The left ventricle has moderately  decreased function. The left ventricle demonstrates regional wall motion abnormalities (see scoring diagram/findings for description). The left ventricular internal cavity size was moderately dilated. Left ventricular diastolic parameters are consistent with Grade II diastolic dysfunction (pseudonormalization). Elevated left ventricular end-diastolic pressure.   2. Right ventricular systolic function is normal. The right ventricular size is normal.   3. Left atrial size was mildly dilated.   4. The mitral valve is abnormal. Mild mitral valve regurgitation. No evidence of mitral stenosis.   5. The aortic valve is tricuspid. There is moderate calcification of the aortic valve. Aortic valve regurgitation is not visualized. Mild to moderate aortic valve sclerosis/calcification is present, without any evidence of aortic stenosis.   6. The inferior vena cava is normal in size with greater than 50% respiratory variability, suggesting right atrial pressure of 3 mmHg.   FINDINGS   Left Ventricle: Septal apical and anterior wall hypokinesis EF decreased compard to TTE done 07/10/19.   Echo 07/10/19 1. Hyperdynamic LV function; grade 1 diastolic dysfunction; moderate LVH;  trace AI.   2. Left ventricular ejection fraction, by estimation, is >75%. The left  ventricle has hyperdynamic function. The left ventricle has no regional  wall motion abnormalities. There is moderate left ventricular hypertrophy.  Left ventricular diastolic  parameters are consistent with Grade I diastolic dysfunction (impaired  relaxation).   3. Right ventricular systolic function is normal. The right ventricular  size is normal.   4. The mitral valve is normal in structure. No evidence of mitral valve  regurgitation. No evidence of mitral stenosis.   5. The aortic valve has an indeterminant number of cusps. Aortic valve  regurgitation is trivial. No aortic stenosis is present.   Echo 08/05/17 Study Conclusions   - Left  ventricle: The cavity size was normal. There was mild focal   basal hypertrophy of the septum. Systolic function was vigorous.   The estimated ejection fraction was in the range of 65% to 70%.   Wall motion was normal; there were no regional wall motion   abnormalities. Doppler parameters are consistent with abnormal   left ventricular relaxation (grade 1 diastolic dysfunction).   Impressions:   - Definity used; vigorous LV systolic function; mild diastolic   dysfunction.   CT chest high resolution 6.17.19 FINDINGS: Cardiovascular: Heart size is normal. There is no significant pericardial fluid, thickening or pericardial calcification. There is aortic atherosclerosis, as well as atherosclerosis of the great vessels of the mediastinum and the coronary arteries, including calcified atherosclerotic plaque in the left main, left anterior descending left circumflex coronary arteries.   IMPRESSION: 1. No findings to suggest interstitial lung disease. 2. Mild air trapping indicative of mild small airways disease. 3. Aortic atherosclerosis, in addition to left main and 2 vessel coronary artery disease. Please note that although the presence of coronary artery calcium documents the presence of coronary artery disease, the severity of this disease and any potential stenosis cannot be assessed on this non-gated CT examination. Assessment for potential risk factor modification, dietary therapy or pharmacologic therapy may be warranted, if clinically indicated. 4. Additional incidental findings, as above.   Stress 2011:  Stress echo 11/2009 Study Conclusions - Stress ECG conclusions: There were no stress arrhythmias or conduction abnormalities. The stress ECG was normal. - Staged echo: There was no echocardiographic evidence for stress-induced ischemia. Impressions: - Nondiagnostic stress echocardiogram with nochest pain, no ST changes; patient did not achieve target heart rate; no  stress-induced wall motion abnormalities.   Had [place] 12/2009 due to nondiagnostic stress echo: Stress Procedure  The patient received IV Lexiscan 0.4 mg over 15-seconds with concurrent low level exercise and then Technetium 73m Tetrofosmin was injected at 30-seconds while the patient continued walking one more minute.  There were no significant changes with Lexiscan.  Quantitative spect images were obtained after a 45 minute delay.   QPS Raw Data Images:  Normal; no motion artifact; normal heart/lung ratio. Stress Images:  Normal homogeneous uptake in all areas of the myocardium. Rest Images:  Normal homogeneous uptake in all areas of the myocardium. Subtraction (SDS):  No evidence of ischemia. Transient Ischemic Dilatation:  1.15  (Normal <1.22)  Lung/Heart Ratio:  .27  (Normal <0.45)   Quantitative Gated Spect Images QGS EDV:  52 ml QGS ESV:  13 ml QGS EF:  75 % QGS cine images:  Normal motion   Findings Normal nuclear study  EKG:  No EKG today.   Recent Labs: 05/24/2021: TSH 1.161 04/25/2022: B Natriuretic Peptide 1,049.7 05/01/2022: ALT 21 05/02/2022: BUN 40; Creatinine, Ser 1.05; Hemoglobin 10.2; Magnesium 2.2; Platelets 215; Potassium 3.9; Sodium 142  Recent Lipid Panel    Component Value Date/Time   CHOL 162 12/21/2020 0826   TRIG 210 (H) 12/21/2020 0826   HDL 64 12/21/2020 0826   CHOLHDL 2.5 12/21/2020 0826   CHOLHDL 8.4 09/23/2020 1253   VLDL UNABLE TO CALCULATE IF TRIGLYCERIDE OVER 400 mg/dL 09/23/2020 1253   LDLCALC 64 12/21/2020 0826   LDLCALC 77 07/04/2018 1017   LDLDIRECT 141.2 (H) 09/23/2020 1253    Home Medications   No outpatient medications have been marked as taking for the 05/21/22 encounter (Appointment) with Loel Dubonnet, NP.     Review of Systems      All other systems reviewed and are otherwise negative except as noted above.  Physical Exam    VS:  There were no vitals taken for this visit. , BMI There is no height or weight on file to  calculate BMI.  Wt Readings from Last 3 Encounters:  04/25/22 137 lb (62.1 kg)  04/06/22 137 lb (62.1 kg)  03/19/22 137 lb (62.1 kg)   GEN: Well nourished, well developed, in no acute distress. HEENT: normal. Neck: Supple, no JVD, carotid bruits, or masses. Cardiac: RRR, no murmurs, rubs, or gallops. No clubbing, cyanosis, edema.  Radials/PT 2+ and equal bilaterally.  Respiratory:  Respirations regular and unlabored, clear to auscultation bilaterally. GI: Soft, nontender, nondistended. MS: No deformity or atrophy. Skin: Warm and dry, no rash.  Neuro:  Strength and sensation are intact. Psych: Normal affect.  Assessment & Plan    CAD - 01/11/21 s/p DES PCI of LAD. Recommended for Xarelto due to hx of DVT/PE, Plavix 75mg .  GDMT includes plavix, atorvastatin. Heart healthy diet and regular cardiovascular exercise encouraged.   *** Symptomatic anemia - Recent admission with blood transfusion. 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 CBC performed earlier today by PCP. No recurrent melena nor hematuria.   HFrEF/ICM - [facility] 01/11/21 with DES to RCA, moderate secondary pulmonary hypertension, moderate to severely reduced cardiac function. Echo 08/2020 reduced LVEF 35-40%.  Escalation of GDMT previously limited by relative hypotension as well as renal function. Daily weight, low salt diet, fluid restriction <2 L encouraged. GDMT includes Lasix. Unable to escalate therapy in setting of relative hypotension with anemia. Will update echocardiogram prior to her appointment in 3 months to reassess LVEF.   DM2 - Continue to follow with  PCP. Recent A1c 5.5.   [id number] - Careful titration of diuretic and antihypertensive.  Labs performed today by PCP including CMET, CBC.  LDL goal <70 - 12/21/20 total cholesterol 162, triglycerides 210, HDL 64, LDL 64. Continue Atorvastatin 80mg  QD. Denies myalgias. Triglycerides not at goal. Recommend reducing intake of carbohydrates, sweets, sugars.  Hypokalemia - Taking  potassium 40mEq daily. CMET today by PCP. Will assess potassium and determine whether any changes to therapy plan is needed.   Disposition: Follow up in 3 months with Buford Dresser, MD or APP.  Signed, Loel Dubonnet, NP 05/21/2022, 10:26 AM [place]

## 2022-05-22 ENCOUNTER — Encounter: Payer: Self-pay | Admitting: Internal Medicine

## 2022-05-22 ENCOUNTER — Telehealth (HOSPITAL_BASED_OUTPATIENT_CLINIC_OR_DEPARTMENT_OTHER): Payer: Self-pay | Admitting: Cardiology

## 2022-05-22 ENCOUNTER — Ambulatory Visit: Payer: Medicare PPO | Attending: Internal Medicine | Admitting: Internal Medicine

## 2022-05-22 VITALS — BP 131/64 | HR 85 | Resp 14 | Ht 61.0 in | Wt 125.0 lb

## 2022-05-22 DIAGNOSIS — M0600 Rheumatoid arthritis without rheumatoid factor, unspecified site: Secondary | ICD-10-CM

## 2022-05-22 DIAGNOSIS — R21 Rash and other nonspecific skin eruption: Secondary | ICD-10-CM

## 2022-05-22 MED ORDER — TRIAMCINOLONE ACETONIDE 0.1 % EX OINT: 1.0000 | TOPICAL_OINTMENT | Freq: Two times a day (BID) | CUTANEOUS | 0 refills | Status: DC | PRN

## 2022-05-22 NOTE — Telephone Encounter (Signed)
Per discharge summary 05/05/2022 stop Valsartan and start Entresto  Advised patient, verbalized understanding

## 2022-05-22 NOTE — Telephone Encounter (Signed)
Pt c/o medication issue:  1. Name of Medication: Valsartan or Entresto  2. How are you currently taking this medication (dosage and times per day)?   3. Are you having a reaction (difficulty breathing--STAT)?   4. What is your medication issue? Patient wants to know which one of these medicine is she supposed to be taking?

## 2022-05-23 ENCOUNTER — Encounter: Payer: Self-pay | Admitting: Family Medicine

## 2022-05-23 ENCOUNTER — Telehealth: Payer: Self-pay | Admitting: Allergy and Immunology

## 2022-05-23 DIAGNOSIS — E1122 Type 2 diabetes mellitus with diabetic chronic kidney disease: Secondary | ICD-10-CM | POA: Diagnosis not present

## 2022-05-23 DIAGNOSIS — I2510 Atherosclerotic heart disease of native coronary artery without angina pectoris: Secondary | ICD-10-CM | POA: Diagnosis not present

## 2022-05-23 DIAGNOSIS — N179 Acute kidney failure, unspecified: Secondary | ICD-10-CM | POA: Diagnosis not present

## 2022-05-23 DIAGNOSIS — N1831 Chronic kidney disease, stage 3a: Secondary | ICD-10-CM | POA: Diagnosis not present

## 2022-05-23 DIAGNOSIS — M0600 Rheumatoid arthritis without rheumatoid factor, unspecified site: Secondary | ICD-10-CM | POA: Diagnosis not present

## 2022-05-23 DIAGNOSIS — A419 Sepsis, unspecified organism: Secondary | ICD-10-CM | POA: Diagnosis not present

## 2022-05-23 DIAGNOSIS — I130 Hypertensive heart and chronic kidney disease with heart failure and stage 1 through stage 4 chronic kidney disease, or unspecified chronic kidney disease: Secondary | ICD-10-CM | POA: Diagnosis not present

## 2022-05-23 DIAGNOSIS — J189 Pneumonia, unspecified organism: Secondary | ICD-10-CM | POA: Diagnosis not present

## 2022-05-23 DIAGNOSIS — D631 Anemia in chronic kidney disease: Secondary | ICD-10-CM | POA: Diagnosis not present

## 2022-05-23 DIAGNOSIS — I5043 Acute on chronic combined systolic (congestive) and diastolic (congestive) heart failure: Secondary | ICD-10-CM | POA: Diagnosis not present

## 2022-05-23 DIAGNOSIS — N1832 Chronic kidney disease, stage 3b: Secondary | ICD-10-CM | POA: Diagnosis not present

## 2022-05-23 DIAGNOSIS — M109 Gout, unspecified: Secondary | ICD-10-CM | POA: Diagnosis not present

## 2022-05-23 NOTE — Telephone Encounter (Signed)
Patient called and said that she just got out of the hospital and has not had her infusions in a few weeks. And what to know if she needs to come in for blood work.  [phone]

## 2022-05-23 NOTE — Telephone Encounter (Signed)
Per Dr Neldon Mc patient advised to restart her immunoglobulin infusions

## 2022-05-28 DIAGNOSIS — I130 Hypertensive heart and chronic kidney disease with heart failure and stage 1 through stage 4 chronic kidney disease, or unspecified chronic kidney disease: Secondary | ICD-10-CM | POA: Diagnosis not present

## 2022-05-28 DIAGNOSIS — N1832 Chronic kidney disease, stage 3b: Secondary | ICD-10-CM | POA: Diagnosis not present

## 2022-05-28 DIAGNOSIS — I5043 Acute on chronic combined systolic (congestive) and diastolic (congestive) heart failure: Secondary | ICD-10-CM | POA: Diagnosis not present

## 2022-05-28 DIAGNOSIS — E1122 Type 2 diabetes mellitus with diabetic chronic kidney disease: Secondary | ICD-10-CM | POA: Diagnosis not present

## 2022-05-28 DIAGNOSIS — J189 Pneumonia, unspecified organism: Secondary | ICD-10-CM | POA: Diagnosis not present

## 2022-05-28 DIAGNOSIS — I2510 Atherosclerotic heart disease of native coronary artery without angina pectoris: Secondary | ICD-10-CM | POA: Diagnosis not present

## 2022-05-28 DIAGNOSIS — N179 Acute kidney failure, unspecified: Secondary | ICD-10-CM | POA: Diagnosis not present

## 2022-05-28 DIAGNOSIS — A419 Sepsis, unspecified organism: Secondary | ICD-10-CM | POA: Diagnosis not present

## 2022-05-28 DIAGNOSIS — D631 Anemia in chronic kidney disease: Secondary | ICD-10-CM | POA: Diagnosis not present

## 2022-05-29 ENCOUNTER — Encounter: Payer: Self-pay | Admitting: Family Medicine

## 2022-05-29 ENCOUNTER — Ambulatory Visit: Payer: Medicare PPO | Admitting: Family Medicine

## 2022-05-29 ENCOUNTER — Telehealth: Payer: Self-pay | Admitting: Pharmacist

## 2022-05-29 VITALS — BP 102/56 | HR 76 | Temp 97.7°F | Ht 61.0 in | Wt 127.0 lb

## 2022-05-29 DIAGNOSIS — I5020 Unspecified systolic (congestive) heart failure: Secondary | ICD-10-CM | POA: Diagnosis not present

## 2022-05-29 DIAGNOSIS — E1169 Type 2 diabetes mellitus with other specified complication: Secondary | ICD-10-CM

## 2022-05-29 DIAGNOSIS — J45901 Unspecified asthma with (acute) exacerbation: Secondary | ICD-10-CM | POA: Diagnosis not present

## 2022-05-29 DIAGNOSIS — J398 Other specified diseases of upper respiratory tract: Secondary | ICD-10-CM | POA: Diagnosis not present

## 2022-05-29 DIAGNOSIS — E242 Drug-induced Cushing's syndrome: Secondary | ICD-10-CM | POA: Diagnosis not present

## 2022-05-29 DIAGNOSIS — R059 Cough, unspecified: Secondary | ICD-10-CM | POA: Diagnosis not present

## 2022-05-29 DIAGNOSIS — J479 Bronchiectasis, uncomplicated: Secondary | ICD-10-CM | POA: Diagnosis not present

## 2022-05-29 DIAGNOSIS — N1831 Chronic kidney disease, stage 3a: Secondary | ICD-10-CM | POA: Diagnosis not present

## 2022-05-29 DIAGNOSIS — N1832 Chronic kidney disease, stage 3b: Secondary | ICD-10-CM

## 2022-05-29 DIAGNOSIS — J4550 Severe persistent asthma, uncomplicated: Secondary | ICD-10-CM

## 2022-05-29 NOTE — Progress Notes (Signed)
Care Management & Coordination Services Pharmacy Team  Reason for Encounter: Hypertension  Contacted patient to discuss hypertension disease state. Spoke with patient on 06/18/2022     Current antihypertensive regimen:  Furosemide 40 mg daily - not taking per patient Entresto 24-26 mg twice daily  Patient verbally confirms she is taking the above medications as directed. Yes  How often are you checking your Blood Pressure? daily  she checks her blood pressure in the morning before taking her medication.  Current home BP readings: 152/64  Any readings above 180/100? No If yes any symptoms of hypertensive emergency? patient denies any symptoms of high blood pressure  What recent interventions/DTPs have been made by any provider to improve Blood Pressure control since last CPP Visit: switched to Entresto from Valsartan  Any recent hospitalizations or ED visits since last visit with CPP? No  What diet changes have been made to improve Blood Pressure Control?  No recent diet changes per patient.  What exercise is being done to improve your Blood Pressure Control?  Walks around the house.  Adherence Review: Is the patient currently on ACE/ARB medication? Yes Does the patient have >5 day gap between last estimated fill dates? No  Star Rating Drugs:  Atorvastatin 80 mg last filled 05/10/2022 90 DS Jardiance 10 mg last filled 04/20/2022 30 DS Entresto 24-26 mg last filled 05/19/2022 30 DS   Chart Updates: Recent office visits:  05/29/2022 OV (PCP) [name], MD; I recommended that she stop valsartan since she is now on Entresto. I recommended that she hold the Lasix along with potassium and only use the Lasix as needed for swelling to avoid dehydration   Recent consult visits:  05/22/2022 OV (Rheumatology) Fuller Plan, MD; no medication changes indicated.  06/14/2022 OV (Oncology) Jaci Standard, MD;   Hospital visits:  04/25/2022 ED to Hospital Admission due to  Fever Admit date: 04/25/2022 Discharge date:  05/05/2022  Sepsis due to CAP, POA - resolved  Acute respiratory failure with hypoxia - resolved  Acute proctitis - resolved   -no medication changes indicated.  Medications: Outpatient Encounter Medications as of 05/29/2022  Medication Sig   acetaminophen (TYLENOL) 650 MG CR tablet Take 1,300 mg by mouth daily as needed for pain.   acetic acid-hydrocortisone (VOSOL-HC) OTIC solution Place 4 drops into both ears See admin instructions. Instill 4 drops into both ears the first 5 days of the month, then as directed/as needed for itching   albuterol (VENTOLIN HFA) 108 (90 Base) MCG/ACT inhaler Inhale 2 puffs into the lungs every 6 (six) hours as needed for wheezing or shortness of breath.   allopurinol (ZYLOPRIM) 100 MG tablet TAKE 1 TABLET BY MOUTH DAILY (Patient taking differently: Take 100 mg by mouth daily with supper.)   Alpha-D-Galactosidase (BEANO PO) Take 2 tablets by mouth 3 (three) times daily before meals.   Alpha-Lipoic Acid 600 MG TABS Take 600 mg by mouth daily.   ammonium lactate (AMLACTIN DAILY) 12 % lotion Apply 1 Application topically as needed for dry skin.   atorvastatin (LIPITOR) 80 MG tablet TAKE 1 TABLET BY MOUTH DAILY (Patient taking differently: Take 80 mg by mouth daily with supper.)   azelastine (ASTELIN) 0.1 % nasal spray Place 2 sprays into both nostrils daily. Use in each nostril as directed   benzonatate (TESSALON) 200 MG capsule TAKE 1 CAPSULE BY MOUTH THREE TIMES DAILY AS NEEDED FOR COUGH (Patient taking differently: Take 200 mg by mouth 2 (two) times daily as needed  for cough.)   Calcium-Magnesium-Vitamin D (CALCIUM MAGNESIUM PO) Take 1 tablet by mouth at bedtime.   cetirizine (ZYRTEC) 10 MG tablet Take 1 tablet (10 mg total) by mouth 2 (two) times daily as needed for allergies (Can take a n extra dose during flare ups.). (Patient taking differently: Take 10 mg by mouth at bedtime.)   Cholecalciferol (VITAMIN D3) 25 MCG  (1000 UT) capsule Take 1,000 Units by mouth at bedtime.   clopidogrel (PLAVIX) 75 MG tablet Take 1 tablet (75 mg total) by mouth daily.   cyproheptadine (PERIACTIN) 4 MG tablet Take 2 tablets (8 mg total) by mouth at bedtime. Take 2 tablets at bedtime as needed to treat facial pain or sleep dysfunction. (Patient taking differently: Take 8 mg by mouth at bedtime.)   denosumab (PROLIA) 60 MG/ML SOSY injection Inject 60 mg into the skin every 6 (six) months.   dexlansoprazole (DEXILANT) 60 MG capsule Take 1 capsule (60 mg total) by mouth daily.   empagliflozin (JARDIANCE) 10 MG TABS tablet Take 1 tablet (10 mg total) by mouth daily before breakfast.   EPINEPHrine 0.3 mg/0.3 mL IJ SOAJ injection USE AS DIRECTED BY YOUR PHYSICIAN INTRAMUSCULARLY AS NEEDED FOR ANAPHYLAXIS (Patient taking differently: Inject 0.3 mg into the muscle as needed for anaphylaxis.)   famotidine (PEPCID) 20 MG tablet TAKE 1 TABLET BY MOUTH EACH NIGHT AT BEDTIME (Patient taking differently: Take 20 mg by mouth at bedtime.)   fluticasone (FLONASE) 50 MCG/ACT nasal spray USE 2 SPRAYS IN EACH NOSTRIL DAILY (Patient taking differently: Place 2 sprays into both nostrils daily.)   folic acid (FOLVITE) 1 MG tablet Take 1 tablet (1 mg total) by mouth daily.   furosemide (LASIX) 40 MG tablet Take 1 tablet (40 mg total) by mouth daily. (Patient taking differently: Take 40 mg by mouth in the morning.)   gabapentin (NEURONTIN) 300 MG capsule Take 1 capsule (300 mg total) by mouth at bedtime.   GOODSENSE ARTHRITIS PAIN 1 % GEL Apply 1 Application topically 4 times daily as needed for pain.   guaiFENesin (MUCINEX) 600 MG 12 hr tablet Take 1 tablet (600 mg total) by mouth 2 (two) times daily as needed for cough or to loosen phlegm. (Patient taking differently: Take 600 mg by mouth 2 (two) times daily.)   [START ON 06/04/2022] HIZENTRA 1 GM/5ML SOLN 13 g by Subcutaneous Infusion route See admin instructions. On hold while at rehab. Hizentra- 13 g  infused every Friday  ("3 times/5 ml = 65 ml total once weekly") (Patient not taking: Reported on 05/22/2022)   ipratropium-albuterol (DUONEB) 0.5-2.5 (3) MG/3ML SOLN Take 3 mLs by nebulization every 6 (six) hours as needed. (Patient not taking: Reported on 05/22/2022)   isosorbide-hydrALAZINE (BIDIL) 20-37.5 MG tablet Take 1 tablet by mouth 2 (two) times daily. (Patient taking differently: Take 1 tablet by mouth daily with supper.)   Lactase (LACTOSE FAST ACTING RELIEF PO) Take 3 tablets by mouth 3 (three) times daily before meals.   lidocaine-prilocaine (EMLA) cream Apply 1 application  topically as needed (pain).   magnesium oxide (MAG-OX) 400 (240 Mg) MG tablet Take 400 mg by mouth at bedtime.   Menthol, Topical Analgesic, (BIOFREEZE) 4 % GEL Apply 1 application  topically daily as needed (for shoulder or joint pain).   mirtazapine (REMERON SOL-TAB) 30 MG disintegrating tablet Take 1 tablet (30 mg total) by mouth at bedtime as needed (sleep). (Patient taking differently: Take 30 mg by mouth at bedtime.)   montelukast (SINGULAIR) 10 MG tablet Take  1 tablet (10 mg total) by mouth daily. (Patient taking differently: Take 10 mg by mouth daily after supper.)   Nystatin (GERHARDT'S BUTT CREAM) CREA Apply topically 3 times a day as needed for itching or irritation   nystatin (MYCOSTATIN) 100000 UNIT/ML suspension Use as directed 15 mLs in the mouth or throat at bedtime.   potassium chloride (KLOR-CON M) 10 MEQ tablet Take 1 tablet (10 mEq total) by mouth daily. (Patient taking differently: Take 10 mEq by mouth daily with supper.)   predniSONE (DELTASONE) 1 MG tablet Take 2 tablets (2 mg total) by mouth daily. Total of 7 mg daily. (Patient taking differently: Take 2 mg by mouth daily with breakfast. Total of 7 mg daily.)   predniSONE (DELTASONE) 5 MG tablet Take 1 tablet (5 mg total) by mouth daily with breakfast. Along with 2 mg tablets for total daily dose 7 mg total daily   Prenatal Vit-Fe Fumarate-FA  (PRENATAL VITAMINS PO) Take 1 tablet by mouth daily.   Probiotic Product (ALIGN PO) Take 1 capsule by mouth daily.   Propylene Glycol (SYSTANE COMPLETE) 0.6 % SOLN Place 1 drop into both eyes 2 (two) times daily.   sacubitril-valsartan (ENTRESTO) 24-26 MG Take 1 tablet by mouth 2 (two) times daily. (Patient not taking: Reported on 05/22/2022)   Tiotropium Bromide-Olodaterol (STIOLTO RESPIMAT) 2.5-2.5 MCG/ACT AERS Inhale 2 puffs into the lungs daily. (Patient taking differently: Inhale 2 puffs into the lungs at bedtime.)   traMADol (ULTRAM) 50 MG tablet Take 1 tablet (50 mg total) by mouth at bedtime.   Triamcinolone Acetonide (TRIAMCINOLONE 0.1 % CREAM : EUCERIN) CREA Apply 1 Application topically 2 (two) times daily.   triamcinolone ointment (KENALOG) 0.1 % Apply 1 Application topically 2 (two) times daily as needed.   valsartan (DIOVAN) 40 MG tablet Take 40 mg by mouth daily.   XARELTO 20 MG TABS tablet TAKE 1 TABLET BY MOUTH DAILY WITH SUPPER (Patient taking differently: Take 20 mg by mouth daily with supper.)   Facility-Administered Encounter Medications as of 05/29/2022  Medication   iohexol (OMNIPAQUE) 350 MG/ML injection    Recent Office Vitals: BP Readings from Last 3 Encounters:  05/22/22 131/64  05/05/22 (!) 102/43  04/06/22 130/68   Pulse Readings from Last 3 Encounters:  05/22/22 85  05/05/22 85  04/06/22 70    Wt Readings from Last 3 Encounters:  05/22/22 125 lb (56.7 kg)  04/25/22 137 lb (62.1 kg)  04/06/22 137 lb (62.1 kg)     Kidney Function Lab Results  Component Value Date/Time   CREATININE 1.05 (H) 05/02/2022 05:42 AM   CREATININE 1.41 (H) 05/01/2022 05:40 AM   CREATININE 1.68 (H) 03/07/2022 12:23 PM   CREATININE 1.93 (H) 12/21/2021 10:31 AM   CREATININE 1.72 (H) 09/19/2021 10:18 AM   CREATININE 1.37 (H) 08/14/2021 11:56 AM   GFR 38.39 (L) 05/03/2021 10:27 AM   GFRNONAA 56 (L) 05/02/2022 05:42 AM   GFRNONAA 27 (L) 12/21/2021 10:31 AM   GFRNONAA 34 (L)  08/25/2020 02:35 PM   GFRAA 40 (L) 08/25/2020 02:35 PM       Latest Ref Rng & Units 05/02/2022    5:42 AM 05/01/2022    5:40 AM 04/30/2022    4:59 AM  BMP  Glucose 70 - 99 mg/dL 161  096  86   BUN 8 - 23 mg/dL 40  28  19   Creatinine 0.44 - 1.00 mg/dL 0.45  4.09  8.11   Sodium 135 - 145 mmol/L 142  141  142   Potassium 3.5 - 5.1 mmol/L 3.9  4.0  3.0   Chloride 98 - 111 mmol/L 107  107  108   CO2 22 - 32 mmol/L 23  22  25    Calcium 8.9 - 10.3 mg/dL 8.2  8.1  7.8      Future Appointments  Date Time Provider Department Center  05/29/2022 12:00 PM [name], MD [facility]  06/14/2022 10:00 AM [facility] None  06/14/2022 10:40 AM Jaci Standard, MD [facility] None  06/28/2022  4:00 PM [facility] None  07/12/2022  3:10 PM Alver Sorrow, NP [facility]  07/19/2022  2:00 PM Erroll Luna, RPH [facility] None  07/19/2022  3:00 PM [facility]NUAL WELLNESS VISIT [facility]  07/20/2022  2:30 PM Martina Sinner, MD [facility] None  09/10/2022 11:00 AM Jodelle Red, MD [facility]  09/10/2022  3:20 PM Dimple Casey, [name], MD [facility] None   April D Calhoun, [facility] Clinical Pharmacist Assistant [phone]

## 2022-05-29 NOTE — Progress Notes (Signed)
Subjective:    Patient ID: Ebony HeidelbergCarol M Scott, female    DOB: 01/20/1951, 72 y.o.   MRN: [id number]  HPI Admit date: 04/25/2022 Discharge date:  05/05/2022   Admitted From: Home Disposition:  [facility] Discharging physician: Lewie Chamberavid Girguis, MD Barriers to discharge: none   Recommendations for Outpatient Follow-up:  Follow up with cardiology Okay to hold Hizentra while at rehab (I've indicated this in prescription below as well)     Discharge Condition: stable CODE STATUS: Full Diet recommendation:  Diet Orders (From admission, onward)        Start     Ordered    05/05/22 0000   Diet general        05/05/22 1155    05/04/22 0000   Diet general        05/04/22 1126    05/02/22 1118   Diet regular Room service appropriate? Yes; Fluid consistency: Thin; Fluid restriction: 2000 mL Fluid  Diet effective now       Question Answer Comment  Room service appropriate? Yes    Fluid consistency: Thin    Fluid restriction: 2000 mL Fluid       05/02/22 1117                  Hospital Course: [name] is a 72 y.o. 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   On presentation, she was febrile to 103 with bradycardia and dyspnea. CT of the head without contrast was negative for any acute intracranial abnormality. CT of abdomen and pelvis without contrast showed possible acute proctitis, possibility of underlying neoplastic process is not excluded; patchy infiltrates in both lower lobes suggestive of atelectasis/pneumonia. She was given IV fluids and antibiotics.  GI and cardiology were consulted.  Subsequently signed off.    Assessment and Plan:   Sepsis due to CAP, POA - resolved  Acute  respiratory failure with hypoxia - resolved  Acute proctitis - resolved  -Presented with altered mental status, fever, diarrhea, tachycardia, tachypnea, leukocytosis with pneumonia and possible proctitis -Received Zithromax for 5 days and Rocephin discontinued on 3/11.  DC'd Flagyl on 04/30/2022 because of persistent nausea and decreased oral intake.  COVID-19/influenza/RSV PCR negative on presentation.  Blood cultures negative so far.  Urine streptococcal antigen negative.  Sputum cultures negative so far. - continue weaning O2 -GI evaluation appreciated.  Continue conservative management.  GI has already signed off.  Stool for C. difficile and GI PCR negative.  Diarrhea improving.  Use Imodium as needed.   Leukocytosis - overall stable and downtrended   Acute metabolic encephalopathy - resolved  -Possibly from above.  CT of the brain negative for acute intracranial abnormality.  Fall precautions. -Monitor mental status.  PT recommends SNF placement.  TOC consulted. - mentation now back to normal baseline    Acute on Chronic systolic heart failure Hypertension -Last echo on 05/25/2021 had shown EF of 30 to 35%.  Not fluid overloaded at this time.  Strict input and output Daily weights.  Fluid restriction.  Outpatient follow-up with cardiology. -Cardiology signed off on 04/28/2022: Ebony Scott has been started by cardiology on 04/27/2022.  BiDil and Ebony DeistFarxiga resumed on 04/28/2022 and Lasix resumed from 04/29/2022.   Nausea -Questionable cause. DC'd Flagyl and sodium bicarbonate on 04/30/2022.  s/p scheduled zofran.  Nausea is improving.  Will switch to Zofran as needed   Chronic kidney disease stage IIIb Acute metabolic acidosis - baseline creat 1.2 - now back to baseline    Prolonged QT - continue monitoring electrolytes intermittently    Hypokalemia -Improved   Mildly positive troponins -Possible from sepsis/pneumonia.  No chest pain.  Troponins did not trend upward significantly. EKG negative  for signs of ischemia on admission    Bilateral conjunctivitis - resolved  - s/p eye drops   Asthma Bronchiectasis -Continue current inhaled/nebulized regimen.  Outpatient follow-up with pulmonary. -Currently montelukast   History of DVT/PE -Continue Xarelto   History of seronegative rheumatoid arthritis -Outpatient follow-up with rheumatology.   Home dose of prednisone resumed from 04/27/2022 onwards.  Left upper extremity duplex ultrasound negative for DVT.  X-ray of left hand shows features of rheumatoid arthritis. -Home prednisone resumed - Resume Plaquenil 05/03/2022   Hypogammaglobulinemia -On Hizentra as an outpatient. Okay to hold while at rehab.     Principal Diagnosis: Community acquired pneumonia  05/29/22 Patient is here today for follow-up.  In the hospital, cardiology added Entresto for congestive heart failure.  Patient's ejection fraction was 35%.  She is now on [place], and BiDil.  However she is also on valsartan.  I believe it was an accident that she is still taking valsartan after starting Entresto.  Therefore I recommended that she stop valsartan.  She denies any chest pain but she does report fatigue and shortness of breath with activity.  She reports feeling no energy.  She has not had any residual fevers since discharge from the hospital after completing antibiotic therapy for community-acquired pneumonia.  She is still taking Lasix/potassium daily for swelling.  She has a history of stage IIIb chronic kidney disease.  However there is no pitting edema today on extremities and her lungs are clear to auscultation bilaterally.  I question if she needs the daily dose of diuretic at this point specially now that she is adding Entresto.  She denies any pain in her rectum or pain with defecation.  There was a questionable diagnosis of proctitis based on the CT scan finding.  She denies any diarrhea, blood in her stool, or pain.  She is still taking Plavix along  with Xarelto.  She denies any bleeding or bruising.  She has resumed her Hizentra for hypogammaglobulinemia. Past Medical History:  Diagnosis Date   Abscess of dorsum of right hand 09/22/2020   Acute deep vein thrombosis (DVT) of right lower extremity 12/13/2017   Allergic rhinitis    Anemia    Angio-edema    Anxiety    pt denies dx   Arthritis    Phreesia 10/21/2019   Asthma    Phreesia 10/21/2019   Breast cancer 1998   Left breast, in remission   Bronchiectasis    Cataract    REMOVED   CHF (congestive heart failure)    Clostridium difficile colitis 01/14/2018   Complication of anesthesia    "had hard time waking up from it several times" (02/20/2012), no anesthesia problems since 2014   Coronary artery disease    Depression    "some; don't take anything for it" (02/20/2012), pt denies dx as of 06/02/21   Diverticulosis    DVT (deep venous thrombosis)    Dyspnea    all the time   Fibromyalgia 11/2011   GAVE (gastric antral vascular ectasia)    GERD (gastroesophageal reflux disease)    Graves disease    Headache(784.0)    "  related to allergies; more at different times during the year" (02/20/2012)   Hemorrhoids    Hiatal hernia    back and neck   History of blood transfusion 2023   1 unit, 5 units of Iron   Hx of adenomatous colonic polyps 04/12/2016   Hypercholesteremia    good cholesterol is high   Hypothyroidism    IBS (irritable bowel syndrome)    Moderate persistent asthma    -FeV1 72% 2011, -IgE 102 2011, CT sinus Neg 2011   Osteomyelitis of second toe of right foot 09/23/2020   Osteoporosis    on reclast yearly   Peripheral vascular disease 2019   DVTs (3) lungs and leg   Pneumonia 04/2011; ~ 11/2011   "double; single" (02/20/2012)   Pneumonia    Septic olecranon bursitis of right elbow 09/22/2020   Seronegative rheumatoid arthritis    Dr. Casimer Lanius   SIRS (systemic inflammatory response syndrome) 02/10/2018   Sleep apnea    uses cpap nightly    Tracheobronchomalacia    Past Surgical History:  Procedure Laterality Date   ABDOMINAL HYSTERECTOMY N/A    Phreesia 10/21/2019   ANTERIOR AND POSTERIOR REPAIR  1990's   APPENDECTOMY     ARGON LASER APPLICATION  02/05/2021   Procedure: ARGON LASER APPLICATION;  Surgeon: Benancio Deeds, MD;  Location: [facility];  Service: Gastroenterology;;   BREAST LUMPECTOMY  1998   left   BREAST SURGERY N/A    Phreesia 10/21/2019   BRONCHIAL WASHINGS  04/05/2020   Procedure: BRONCHIAL WASHINGS;  Surgeon: Martina Sinner, MD;  Location: [facility];  Service: Pulmonary;;   CAPSULOTOMY Right 08/28/2021   Procedure: CAPSULOTOMY;  Surgeon: Candelaria Stagers, DPM;  Location: [facility];  Service: Podiatry;  Laterality: Right;   CARPOMETACARPEL (CMC) FUSION OF THUMB WITH AUTOGRAFT FROM RADIUS  ~ 2009   "both thumbs" (02/20/2012)   CATARACT EXTRACTION W/ INTRAOCULAR LENS  IMPLANT, BILATERAL  2012   CERVICAL DISCECTOMY  10/2001   C5-C6   CERVICAL FUSION  2003   C3-C4   CHOLECYSTECTOMY     COLONOSCOPY     CORONARY STENT INTERVENTION N/A 01/11/2021   Procedure: CORONARY STENT INTERVENTION;  Surgeon: Marykay Lex, MD;  Location[facility] INVASIVE CV LAB;  Service: Cardiovascular;  Laterality: N/A;   DEBRIDEMENT TENNIS ELBOW  ?1970's   right   ESOPHAGOGASTRODUODENOSCOPY     ESOPHAGOGASTRODUODENOSCOPY (EGD) WITH PROPOFOL N/A 02/05/2021   Procedure: ESOPHAGOGASTRODUODENOSCOPY (EGD) WITH PROPOFOL;  Surgeon: Benancio Deeds, MD;  Location: [facility];  Service: Gastroenterology;  Laterality: N/A;   EYE SURGERY N/A    Phreesia 10/21/2019   HAMMER TOE SURGERY Right 08/28/2021   Procedure: HAMMER TOE REPAIR 2ND TOE RIGHT FOOT;  Surgeon: Candelaria Stagers, DPM;  Location: [facility];  Service: Podiatry;  Laterality: Right;   HYSTERECTOMY     I & D EXTREMITY Right 09/22/2020   Procedure: IRRIGATION AND DEBRIDEMENT RIGHT HAND AND ELBOW;  Surgeon: Teryl Lucy, MD;  Location: [facility];  Service: Orthopedics;   Laterality: Right;   KNEE ARTHROPLASTY  ?1990's   "?right; w/cartilage repair" (02/20/2012)   MASS EXCISION Left 08/28/2021   Procedure: EXCISION OF SOFT TISSUE  MASS LEFT FOOT;  Surgeon: Candelaria Stagers, DPM;  Location: [facility];  Service: Podiatry;  Laterality: Left;   NASAL SEPTUM SURGERY  1980's   POSTERIOR CERVICAL FUSION/FORAMINOTOMY  2004   "failed initial fusion; rewired  anterior neck" (02/20/2012)   REVERSE SHOULDER ARTHROPLASTY Right 02/16/2020   Procedure:  REVERSE SHOULDER ARTHROPLASTY;  Surgeon: Teryl Lucy, MD;  Location: [facility];  Service: Orthopedics;  Laterality: Right;   RIGHT/LEFT HEART CATH AND CORONARY ANGIOGRAPHY N/A 01/11/2021   Procedure: RIGHT/LEFT HEART CATH AND CORONARY ANGIOGRAPHY;  Surgeon: Marykay Lex, MD;  Location: [facility] INVASIVE CV LAB;  Service: Cardiovascular;  Laterality: N/A;   SPINE SURGERY N/A    Phreesia 10/21/2019   TONSILLECTOMY  ~ 1953   VESICOVAGINAL FISTULA CLOSURE W/ TAH  1988   VIDEO BRONCHOSCOPY Bilateral 08/23/2016   Procedure: VIDEO BRONCHOSCOPY WITH FLUORO;  Surgeon: Roslynn Amble, MD;  Location: [facility];  Service: Cardiopulmonary;  Laterality: Bilateral;   VIDEO BRONCHOSCOPY N/A 04/05/2020   Procedure: VIDEO BRONCHOSCOPY WITHOUT FLUORO;  Surgeon: Martina Sinner, MD;  Location: [facility];  Service: Pulmonary;  Laterality: N/A;   Current Outpatient Medications on File Prior to Visit  Medication Sig Dispense Refill   acetaminophen (TYLENOL) 650 MG CR tablet Take 1,300 mg by mouth daily as needed for pain.     acetic acid-hydrocortisone (VOSOL-HC) OTIC solution Place 4 drops into both ears See admin instructions. Instill 4 drops into both ears the first 5 days of the month, then as directed/as needed for itching     albuterol (VENTOLIN HFA) 108 (90 Base) MCG/ACT inhaler Inhale 2 puffs into the lungs every 6 (six) hours as needed for wheezing or shortness of breath. 19 g 1   allopurinol (ZYLOPRIM) 100 MG tablet TAKE 1 TABLET BY MOUTH  DAILY (Patient taking differently: Take 100 mg by mouth daily with supper.) 90 tablet 2   Alpha-D-Galactosidase (BEANO PO) Take 2 tablets by mouth 3 (three) times daily before meals.     Alpha-Lipoic Acid 600 MG TABS Take 600 mg by mouth daily.     ammonium lactate (AMLACTIN DAILY) 12 % lotion Apply 1 Application topically as needed for dry skin. 400 g 0   atorvastatin (LIPITOR) 80 MG tablet TAKE 1 TABLET BY MOUTH DAILY (Patient taking differently: Take 80 mg by mouth daily with supper.) 90 tablet 2   azelastine (ASTELIN) 0.1 % nasal spray Place 2 sprays into both nostrils daily. Use in each nostril as directed 30 mL 5   benzonatate (TESSALON) 200 MG capsule TAKE 1 CAPSULE BY MOUTH THREE TIMES DAILY AS NEEDED FOR COUGH (Patient taking differently: Take 200 mg by mouth 2 (two) times daily as needed for cough.) 30 capsule 0   Calcium-Magnesium-Vitamin D (CALCIUM MAGNESIUM PO) Take 1 tablet by mouth at bedtime.     cetirizine (ZYRTEC) 10 MG tablet Take 1 tablet (10 mg total) by mouth 2 (two) times daily as needed for allergies (Can take a n extra dose during flare ups.). (Patient taking differently: Take 10 mg by mouth at bedtime.) 60 tablet 5   Cholecalciferol (VITAMIN D3) 25 MCG (1000 UT) capsule Take 1,000 Units by mouth at bedtime.     clopidogrel (PLAVIX) 75 MG tablet Take 1 tablet (75 mg total) by mouth daily. 90 tablet 3   cyproheptadine (PERIACTIN) 4 MG tablet Take 2 tablets (8 mg total) by mouth at bedtime. Take 2 tablets at bedtime as needed to treat facial pain or sleep dysfunction. (Patient taking differently: Take 8 mg by mouth at bedtime.) 60 tablet 5   denosumab (PROLIA) 60 MG/ML SOSY injection Inject 60 mg into the skin every 6 (six) months.     dexlansoprazole (DEXILANT) 60 MG capsule Take 1 capsule (60 mg total) by mouth daily. 90 capsule 3   empagliflozin (JARDIANCE) 10 MG  TABS tablet Take 1 tablet (10 mg total) by mouth daily before breakfast. 90 tablet 1   EPINEPHrine 0.3 mg/0.3 mL  IJ SOAJ injection USE AS DIRECTED BY YOUR PHYSICIAN INTRAMUSCULARLY AS NEEDED FOR ANAPHYLAXIS (Patient taking differently: Inject 0.3 mg into the muscle as needed for anaphylaxis.) 2 each 1   famotidine (PEPCID) 20 MG tablet TAKE 1 TABLET BY MOUTH EACH NIGHT AT BEDTIME (Patient taking differently: Take 20 mg by mouth at bedtime.) 30 tablet 1   fluticasone (FLONASE) 50 MCG/ACT nasal spray USE 2 SPRAYS IN EACH NOSTRIL DAILY (Patient taking differently: Place 2 sprays into both nostrils daily.) 16 g 2   folic acid (FOLVITE) 1 MG tablet Take 1 tablet (1 mg total) by mouth daily. 90 tablet 3   furosemide (LASIX) 40 MG tablet Take 1 tablet (40 mg total) by mouth daily. (Patient taking differently: Take 40 mg by mouth in the morning.) 90 tablet 2   gabapentin (NEURONTIN) 300 MG capsule Take 1 capsule (300 mg total) by mouth at bedtime.     GOODSENSE ARTHRITIS PAIN 1 % GEL Apply 1 Application topically 4 times daily as needed for pain. 100 g 3   guaiFENesin (MUCINEX) 600 MG 12 hr tablet Take 1 tablet (600 mg total) by mouth 2 (two) times daily as needed for cough or to loosen phlegm. (Patient taking differently: Take 600 mg by mouth 2 (two) times daily.) 60 tablet 1   [START ON 06/04/2022] HIZENTRA 1 GM/5ML SOLN 13 g by Subcutaneous Infusion route See admin instructions. On hold while at rehab. Hizentra- 13 g infused every Friday  ("3 times/5 ml = 65 ml total once weekly")     ipratropium-albuterol (DUONEB) 0.5-2.5 (3) MG/3ML SOLN Take 3 mLs by nebulization every 6 (six) hours as needed. 360 mL 6   isosorbide-hydrALAZINE (BIDIL) 20-37.5 MG tablet Take 1 tablet by mouth 2 (two) times daily. (Patient taking differently: Take 1 tablet by mouth daily with supper.) 180 tablet 1   Lactase (LACTOSE FAST ACTING RELIEF PO) Take 3 tablets by mouth 3 (three) times daily before meals.     lidocaine-prilocaine (EMLA) cream Apply 1 application  topically as needed (pain).     magnesium oxide (MAG-OX) 400 (240 Mg) MG tablet  Take 400 mg by mouth at bedtime.     Menthol, Topical Analgesic, (BIOFREEZE) 4 % GEL Apply 1 application  topically daily as needed (for shoulder or joint pain).     mirtazapine (REMERON SOL-TAB) 30 MG disintegrating tablet Take 1 tablet (30 mg total) by mouth at bedtime as needed (sleep). (Patient taking differently: Take 30 mg by mouth at bedtime.) 90 tablet 3   montelukast (SINGULAIR) 10 MG tablet Take 1 tablet (10 mg total) by mouth daily. (Patient taking differently: Take 10 mg by mouth daily after supper.) 90 tablet 1   Nystatin (GERHARDT'S BUTT CREAM) CREA Apply topically 3 times a day as needed for itching or irritation 4 each 6   nystatin (MYCOSTATIN) 100000 UNIT/ML suspension Use as directed 15 mLs in the mouth or throat at bedtime.     potassium chloride (KLOR-CON M) 10 MEQ tablet Take 1 tablet (10 mEq total) by mouth daily. (Patient taking differently: Take 10 mEq by mouth daily with supper.) 90 tablet 1   Prenatal Vit-Fe Fumarate-FA (PRENATAL VITAMINS PO) Take 1 tablet by mouth daily.     Probiotic Product (ALIGN PO) Take 1 capsule by mouth daily.     Propylene Glycol (SYSTANE COMPLETE) 0.6 % SOLN Place 1 drop  into both eyes 2 (two) times daily. 15 mL 5   sacubitril-valsartan (ENTRESTO) 24-26 MG Take 1 tablet by mouth 2 (two) times daily. 60 tablet    Tiotropium Bromide-Olodaterol (STIOLTO RESPIMAT) 2.5-2.5 MCG/ACT AERS Inhale 2 puffs into the lungs daily. (Patient taking differently: Inhale 2 puffs into the lungs at bedtime.) 4 g 11   traMADol (ULTRAM) 50 MG tablet Take 1 tablet (50 mg total) by mouth at bedtime. 10 tablet 0   Triamcinolone Acetonide (TRIAMCINOLONE 0.1 % CREAM : EUCERIN) CREA Apply 1 Application topically 2 (two) times daily. 1 each 1   triamcinolone ointment (KENALOG) 0.1 % Apply 1 Application topically 2 (two) times daily as needed. 80 g 0   XARELTO 20 MG TABS tablet TAKE 1 TABLET BY MOUTH DAILY WITH SUPPER (Patient taking differently: Take 20 mg by mouth daily with  supper.) 90 tablet 2   Current Facility-Administered Medications on File Prior to Visit  Medication Dose Route Frequency Provider Last Rate Last Admin   iohexol (OMNIPAQUE) 350 MG/ML injection    PRN Marykay Lex, MD   105 mL at 01/11/21 0955   Allergies  Allergen Reactions   Baclofen Anaphylaxis and Other (See Comments)    Altered mental status requiring a 3-day hospital stay- patient became unresponsive   "Put me in a coma for five days", Altered mental status requiring a 3-day hospital stay- patient became unresponsive   Dust Mite Extract Shortness Of Breath and Other (See Comments)    "sneezing" (02/20/2012) too   Molds & Smuts Shortness Of Breath   Morphine And Related Hives and Itching   Other Shortness Of Breath and Other (See Comments)    Grass and weeds "sneezing; filled sinuses" (02/20/2012)   Penicillins Rash and Other (See Comments)    "welts" (02/20/2012)  Tolerated Ancef 02/16/20   Rofecoxib Swelling and Other (See Comments)    Vioxx- feet swelling   Shellfish Allergy Anaphylaxis, Shortness Of Breath, Itching and Rash   Shrimp Extract Anaphylaxis and Other (See Comments)    ALL SHELLFISH   Tetracycline Hcl Nausea And Vomiting   Tetracycline Hcl Other (See Comments)    GI Intolerance   Xolair [Omalizumab] Other (See Comments)    Caused Blood clot   Zoledronic Acid Other (See Comments)    Reclast- Fever, Put in hospital, dr said it was a reaction from a reaction    Fever, inability to walk, Put in hospital  Fever, Put in hospital, dr said it was a reaction from a reaction , Other reaction(s): Unknown   Dilaudid [Hydromorphone Hcl] Itching   Hydrocodone-Acetaminophen Itching   Levofloxacin Other (See Comments)    GI upset  Other Reaction(s): GI Intolerance, Other (See Comments)  REACTION: GI upset, GI upset   Oxycodone Hcl Nausea And Vomiting   Oxycodone Hcl Other (See Comments)    GI Intolerance   Paroxetine Nausea And Vomiting and Other (See Comments)     GI Intolerance also   Celecoxib Swelling and Other (See Comments)    Feet swelling   Diltiazem Hcl Swelling   Lactose Other (See Comments)    Bloating and gas   Lactose Intolerance (Gi) Other (See Comments)    Bloating and gas   Oxycodone-Acetaminophen Itching   Rituximab Other (See Comments)    Anemia    Tree Extract Other (See Comments)    "tested and told I was allergic to it; never experienced a reaction to it" (02/20/2012)   Gamunex [Immune Globulin] Itching and Rash  Tolerates hizentra    Penicillin G Procaine Rash and Other (See Comments)    Welts, also   Social History   Socioeconomic History   Marital status: Divorced    Spouse name: Not on file   Number of children: 2   Years of education: college   Highest education level: Not on file  Occupational History   Occupation: Disabled    Comment: Retired Dispensing optician: RETIRED  Tobacco Use   Smoking status: Never    Passive exposure: Past   Smokeless tobacco: Never   Tobacco comments:    Parents  Vaping Use   Vaping Use: Never used  Substance and Sexual Activity   Alcohol use: Not Currently    Alcohol/week: 0.0 standard drinks of alcohol   Drug use: No   Sexual activity: Not Currently    Birth control/protection: Surgical    Comment: Hysterectomy  Other Topics Concern   Not on file  Social History Narrative   Patient lives at home alone. Patient  divorced.    Patient has her BS degree.   Right handed.   Caffeine- sometimes coffee.      [facility]:   Born in [place], [place]. She worked as a Armed forces operational officer. She has no pets currently. She does have indoor plants. Previously had mold in her home that was remediated. Carpet was removed.          Social Determinants of Health   Financial Resource Strain: Low Risk  (04/18/2022)   Overall Financial Resource Strain (CARDIA)    Difficulty of Paying Living Expenses: Not hard at all  Food Insecurity: No Food Insecurity  (04/25/2022)   Hunger Vital Sign    Worried About Running Out of Food in the Last Year: Never true    Ran Out of Food in the Last Year: Never true  Transportation Needs: No Transportation Needs (04/25/2022)   PRAPARE - Administrator, Civil Service (Medical): No    Lack of Transportation (Non-Medical): No  Physical Activity: Insufficiently Active (07/13/2021)   Exercise Vital Sign    Days of Exercise per Week: 5 days    Minutes of Exercise per Session: 20 min  Stress: No Stress Concern Present (07/13/2021)   [employer] of Occupational Health - Occupational Stress Questionnaire    Feeling of Stress : Only a little  Social Connections: Moderately Isolated (07/13/2021)   Social Connection and Isolation Panel [NHANES]    Frequency of Communication with Friends and Family: More than three times a week    Frequency of Social Gatherings with Friends and Family: More than three times a week    Attends Religious Services: 1 to 4 times per year    Active Member of [employer] or Organizations: No    Attends Banker Meetings: Never    Marital Status: Divorced  Catering manager Violence: Not At Risk (04/25/2022)   Humiliation, Afraid, Rape, and Kick questionnaire    Fear of Current or Ex-Partner: No    Emotionally Abused: No    Physically Abused: No    Sexually Abused: No     Review of Systems     Objective:   Physical Exam Vitals reviewed.  Constitutional:      General: She is not in acute distress.    Appearance: Normal appearance. She is not ill-appearing or toxic-appearing.  HENT:     Nose: No congestion or rhinorrhea.  Cardiovascular:     Rate and Rhythm: Normal  rate and regular rhythm.     Heart sounds: Normal heart sounds. No murmur heard. Pulmonary:     Effort: Pulmonary effort is normal. No respiratory distress.     Breath sounds: No stridor or decreased air movement. No wheezing, rhonchi or rales.  Abdominal:     General: Bowel sounds are normal. There  is no distension.     Palpations: Abdomen is soft.     Tenderness: There is no abdominal tenderness. There is no guarding.  Musculoskeletal:     Right lower leg: No edema.     Left lower leg: No edema.  Skin:    Findings: No erythema.  Neurological:     Mental Status: She is alert and oriented to person, place, and time.     Cranial Nerves: No cranial nerve deficit.     Sensory: No sensory deficit.     Motor: No weakness.     Coordination: Coordination normal.        Assessment & Plan:  HFrEF (heart failure with reduced ejection fraction) - Plan: COMPLETE METABOLIC PANEL WITH GFR, CBC with Differential/Platelet  Type 2 diabetes mellitus with other specified complication, unspecified whether long term insulin use - Plan: Hemoglobin A1c  Stage 3b chronic kidney disease  Iatrogenic Cushing's syndrome  Asthma, severe persistent, well-controlled Spent the majority of our visit today going through her medication list and explaining what her medicines are for.  I recommended that she stop valsartan since she is now on Entresto.  I explained that this is redundant.  She is currently taking Jardiance, Entresto, and Lasix.  She has a history of stage IIIb chronic kidney disease.  There is no evidence of peripheral edema or pulmonary edema on exam.  Therefore I recommended that she hold the Lasix along with potassium and only use the Lasix as needed for swelling to avoid dehydration.  I will check her A1c to ensure adequate control of her diabetes.  However her physical exam today is excellent.  Her lungs sound better today and I have her them in quite some time.  Therefore I feel that she is doing very well and will make no additional changes in her medication.  I will check her liver function test given her previous history of statin induced hepatitis

## 2022-05-30 LAB — CBC WITH DIFFERENTIAL/PLATELET
Absolute Monocytes: 1055 cells/uL — ABNORMAL HIGH (ref 200–950)
Basophils Absolute: 41 cells/uL (ref 0–200)
Basophils Relative: 0.3 %
Eosinophils Absolute: 123 cells/uL (ref 15–500)
Eosinophils Relative: 0.9 %
HCT: 30.2 % — ABNORMAL LOW (ref 35.0–45.0)
Hemoglobin: 10.1 g/dL — ABNORMAL LOW (ref 11.7–15.5)
Lymphs Abs: 712 cells/uL — ABNORMAL LOW (ref 850–3900)
MCH: 34.9 pg — ABNORMAL HIGH (ref 27.0–33.0)
MCHC: 33.4 g/dL (ref 32.0–36.0)
MCV: 104.5 fL — ABNORMAL HIGH (ref 80.0–100.0)
MPV: 11.3 fL (ref 7.5–12.5)
Monocytes Relative: 7.7 %
Neutro Abs: 11768 cells/uL — ABNORMAL HIGH (ref 1500–7800)
Neutrophils Relative %: 85.9 %
Platelets: 216 10*3/uL (ref 140–400)
RBC: 2.89 10*6/uL — ABNORMAL LOW (ref 3.80–5.10)
RDW: 14.2 % (ref 11.0–15.0)
Total Lymphocyte: 5.2 %
WBC: 13.7 10*3/uL — ABNORMAL HIGH (ref 3.8–10.8)

## 2022-05-30 LAB — COMPLETE METABOLIC PANEL WITH GFR
AG Ratio: 1.4 (calc) (ref 1.0–2.5)
ALT: 59 U/L — ABNORMAL HIGH (ref 6–29)
AST: 53 U/L — ABNORMAL HIGH (ref 10–35)
Albumin: 3.6 g/dL (ref 3.6–5.1)
Alkaline phosphatase (APISO): 77 U/L (ref 37–153)
BUN/Creatinine Ratio: 21 (calc) (ref 6–22)
BUN: 29 mg/dL — ABNORMAL HIGH (ref 7–25)
CO2: 26 mmol/L (ref 20–32)
Calcium: 9.4 mg/dL (ref 8.6–10.4)
Chloride: 105 mmol/L (ref 98–110)
Creat: 1.38 mg/dL — ABNORMAL HIGH (ref 0.60–1.00)
Globulin: 2.5 g/dL (calc) (ref 1.9–3.7)
Glucose, Bld: 119 mg/dL — ABNORMAL HIGH (ref 65–99)
Potassium: 4.9 mmol/L (ref 3.5–5.3)
Sodium: 142 mmol/L (ref 135–146)
Total Bilirubin: 0.5 mg/dL (ref 0.2–1.2)
Total Protein: 6.1 g/dL (ref 6.1–8.1)
eGFR: 40 mL/min/{1.73_m2} — ABNORMAL LOW (ref >=60)

## 2022-05-30 LAB — HEMOGLOBIN A1C
Hgb A1c MFr Bld: 6.3 % of total Hgb — ABNORMAL HIGH (ref <5.7)
Mean Plasma Glucose: 134 mg/dL
eAG (mmol/L): 7.4 mmol/L

## 2022-05-31 DIAGNOSIS — A419 Sepsis, unspecified organism: Secondary | ICD-10-CM | POA: Diagnosis not present

## 2022-05-31 DIAGNOSIS — N179 Acute kidney failure, unspecified: Secondary | ICD-10-CM | POA: Diagnosis not present

## 2022-05-31 DIAGNOSIS — D631 Anemia in chronic kidney disease: Secondary | ICD-10-CM | POA: Diagnosis not present

## 2022-05-31 DIAGNOSIS — J189 Pneumonia, unspecified organism: Secondary | ICD-10-CM | POA: Diagnosis not present

## 2022-05-31 DIAGNOSIS — I130 Hypertensive heart and chronic kidney disease with heart failure and stage 1 through stage 4 chronic kidney disease, or unspecified chronic kidney disease: Secondary | ICD-10-CM | POA: Diagnosis not present

## 2022-05-31 DIAGNOSIS — I2510 Atherosclerotic heart disease of native coronary artery without angina pectoris: Secondary | ICD-10-CM | POA: Diagnosis not present

## 2022-05-31 DIAGNOSIS — N1832 Chronic kidney disease, stage 3b: Secondary | ICD-10-CM | POA: Diagnosis not present

## 2022-05-31 DIAGNOSIS — E1122 Type 2 diabetes mellitus with diabetic chronic kidney disease: Secondary | ICD-10-CM | POA: Diagnosis not present

## 2022-05-31 DIAGNOSIS — I5043 Acute on chronic combined systolic (congestive) and diastolic (congestive) heart failure: Secondary | ICD-10-CM | POA: Diagnosis not present

## 2022-06-01 ENCOUNTER — Other Ambulatory Visit: Payer: Self-pay | Admitting: Family Medicine

## 2022-06-01 DIAGNOSIS — M797 Fibromyalgia: Secondary | ICD-10-CM | POA: Diagnosis not present

## 2022-06-01 DIAGNOSIS — M6282 Rhabdomyolysis: Secondary | ICD-10-CM | POA: Diagnosis not present

## 2022-06-01 DIAGNOSIS — M1990 Unspecified osteoarthritis, unspecified site: Secondary | ICD-10-CM | POA: Diagnosis not present

## 2022-06-01 DIAGNOSIS — I130 Hypertensive heart and chronic kidney disease with heart failure and stage 1 through stage 4 chronic kidney disease, or unspecified chronic kidney disease: Secondary | ICD-10-CM | POA: Diagnosis not present

## 2022-06-01 DIAGNOSIS — M810 Age-related osteoporosis without current pathological fracture: Secondary | ICD-10-CM | POA: Diagnosis not present

## 2022-06-01 DIAGNOSIS — R21 Rash and other nonspecific skin eruption: Secondary | ICD-10-CM

## 2022-06-01 DIAGNOSIS — I5043 Acute on chronic combined systolic (congestive) and diastolic (congestive) heart failure: Secondary | ICD-10-CM | POA: Diagnosis not present

## 2022-06-01 DIAGNOSIS — E1151 Type 2 diabetes mellitus with diabetic peripheral angiopathy without gangrene: Secondary | ICD-10-CM | POA: Diagnosis not present

## 2022-06-01 DIAGNOSIS — M0600 Rheumatoid arthritis without rheumatoid factor, unspecified site: Secondary | ICD-10-CM | POA: Diagnosis not present

## 2022-06-01 DIAGNOSIS — J4550 Severe persistent asthma, uncomplicated: Secondary | ICD-10-CM | POA: Diagnosis not present

## 2022-06-01 MED ORDER — TRIAMCINOLONE ACETONIDE 0.1 % EX OINT
1.0000 | TOPICAL_OINTMENT | Freq: Two times a day (BID) | CUTANEOUS | 0 refills | Status: DC | PRN
Start: 2022-06-01 — End: 2022-06-14

## 2022-06-04 DIAGNOSIS — I130 Hypertensive heart and chronic kidney disease with heart failure and stage 1 through stage 4 chronic kidney disease, or unspecified chronic kidney disease: Secondary | ICD-10-CM | POA: Diagnosis not present

## 2022-06-04 DIAGNOSIS — E1122 Type 2 diabetes mellitus with diabetic chronic kidney disease: Secondary | ICD-10-CM | POA: Diagnosis not present

## 2022-06-04 DIAGNOSIS — D631 Anemia in chronic kidney disease: Secondary | ICD-10-CM | POA: Diagnosis not present

## 2022-06-04 DIAGNOSIS — J189 Pneumonia, unspecified organism: Secondary | ICD-10-CM | POA: Diagnosis not present

## 2022-06-04 DIAGNOSIS — N1832 Chronic kidney disease, stage 3b: Secondary | ICD-10-CM | POA: Diagnosis not present

## 2022-06-04 DIAGNOSIS — N179 Acute kidney failure, unspecified: Secondary | ICD-10-CM | POA: Diagnosis not present

## 2022-06-04 DIAGNOSIS — I5043 Acute on chronic combined systolic (congestive) and diastolic (congestive) heart failure: Secondary | ICD-10-CM | POA: Diagnosis not present

## 2022-06-04 DIAGNOSIS — I2510 Atherosclerotic heart disease of native coronary artery without angina pectoris: Secondary | ICD-10-CM | POA: Diagnosis not present

## 2022-06-04 DIAGNOSIS — A419 Sepsis, unspecified organism: Secondary | ICD-10-CM | POA: Diagnosis not present

## 2022-06-06 ENCOUNTER — Telehealth: Payer: Self-pay | Admitting: Allergy and Immunology

## 2022-06-06 NOTE — Telephone Encounter (Addendum)
Ann calling from [employer] called and stated she sent a fax with a form called continued medical need and a request for last office visit. Call back number [phone].

## 2022-06-07 DIAGNOSIS — N1832 Chronic kidney disease, stage 3b: Secondary | ICD-10-CM | POA: Diagnosis not present

## 2022-06-07 DIAGNOSIS — A419 Sepsis, unspecified organism: Secondary | ICD-10-CM | POA: Diagnosis not present

## 2022-06-07 DIAGNOSIS — J189 Pneumonia, unspecified organism: Secondary | ICD-10-CM | POA: Diagnosis not present

## 2022-06-07 DIAGNOSIS — D631 Anemia in chronic kidney disease: Secondary | ICD-10-CM | POA: Diagnosis not present

## 2022-06-07 DIAGNOSIS — E1122 Type 2 diabetes mellitus with diabetic chronic kidney disease: Secondary | ICD-10-CM | POA: Diagnosis not present

## 2022-06-07 DIAGNOSIS — I2510 Atherosclerotic heart disease of native coronary artery without angina pectoris: Secondary | ICD-10-CM | POA: Diagnosis not present

## 2022-06-07 DIAGNOSIS — I5043 Acute on chronic combined systolic (congestive) and diastolic (congestive) heart failure: Secondary | ICD-10-CM | POA: Diagnosis not present

## 2022-06-07 DIAGNOSIS — N179 Acute kidney failure, unspecified: Secondary | ICD-10-CM | POA: Diagnosis not present

## 2022-06-07 DIAGNOSIS — I130 Hypertensive heart and chronic kidney disease with heart failure and stage 1 through stage 4 chronic kidney disease, or unspecified chronic kidney disease: Secondary | ICD-10-CM | POA: Diagnosis not present

## 2022-06-11 DIAGNOSIS — I5043 Acute on chronic combined systolic (congestive) and diastolic (congestive) heart failure: Secondary | ICD-10-CM | POA: Diagnosis not present

## 2022-06-11 DIAGNOSIS — N179 Acute kidney failure, unspecified: Secondary | ICD-10-CM | POA: Diagnosis not present

## 2022-06-11 DIAGNOSIS — D631 Anemia in chronic kidney disease: Secondary | ICD-10-CM | POA: Diagnosis not present

## 2022-06-11 DIAGNOSIS — E1122 Type 2 diabetes mellitus with diabetic chronic kidney disease: Secondary | ICD-10-CM | POA: Diagnosis not present

## 2022-06-11 DIAGNOSIS — I2510 Atherosclerotic heart disease of native coronary artery without angina pectoris: Secondary | ICD-10-CM | POA: Diagnosis not present

## 2022-06-11 DIAGNOSIS — J189 Pneumonia, unspecified organism: Secondary | ICD-10-CM | POA: Diagnosis not present

## 2022-06-11 DIAGNOSIS — A419 Sepsis, unspecified organism: Secondary | ICD-10-CM | POA: Diagnosis not present

## 2022-06-11 DIAGNOSIS — I130 Hypertensive heart and chronic kidney disease with heart failure and stage 1 through stage 4 chronic kidney disease, or unspecified chronic kidney disease: Secondary | ICD-10-CM | POA: Diagnosis not present

## 2022-06-11 DIAGNOSIS — N1832 Chronic kidney disease, stage 3b: Secondary | ICD-10-CM | POA: Diagnosis not present

## 2022-06-12 DIAGNOSIS — A419 Sepsis, unspecified organism: Secondary | ICD-10-CM | POA: Diagnosis not present

## 2022-06-12 DIAGNOSIS — N179 Acute kidney failure, unspecified: Secondary | ICD-10-CM | POA: Diagnosis not present

## 2022-06-12 DIAGNOSIS — I130 Hypertensive heart and chronic kidney disease with heart failure and stage 1 through stage 4 chronic kidney disease, or unspecified chronic kidney disease: Secondary | ICD-10-CM | POA: Diagnosis not present

## 2022-06-12 DIAGNOSIS — D631 Anemia in chronic kidney disease: Secondary | ICD-10-CM | POA: Diagnosis not present

## 2022-06-12 DIAGNOSIS — I5043 Acute on chronic combined systolic (congestive) and diastolic (congestive) heart failure: Secondary | ICD-10-CM | POA: Diagnosis not present

## 2022-06-12 DIAGNOSIS — I2510 Atherosclerotic heart disease of native coronary artery without angina pectoris: Secondary | ICD-10-CM | POA: Diagnosis not present

## 2022-06-12 DIAGNOSIS — E1122 Type 2 diabetes mellitus with diabetic chronic kidney disease: Secondary | ICD-10-CM | POA: Diagnosis not present

## 2022-06-12 DIAGNOSIS — J189 Pneumonia, unspecified organism: Secondary | ICD-10-CM | POA: Diagnosis not present

## 2022-06-12 DIAGNOSIS — N1832 Chronic kidney disease, stage 3b: Secondary | ICD-10-CM | POA: Diagnosis not present

## 2022-06-13 DIAGNOSIS — D631 Anemia in chronic kidney disease: Secondary | ICD-10-CM | POA: Diagnosis not present

## 2022-06-13 DIAGNOSIS — E1122 Type 2 diabetes mellitus with diabetic chronic kidney disease: Secondary | ICD-10-CM | POA: Diagnosis not present

## 2022-06-13 DIAGNOSIS — I130 Hypertensive heart and chronic kidney disease with heart failure and stage 1 through stage 4 chronic kidney disease, or unspecified chronic kidney disease: Secondary | ICD-10-CM | POA: Diagnosis not present

## 2022-06-13 DIAGNOSIS — N179 Acute kidney failure, unspecified: Secondary | ICD-10-CM | POA: Diagnosis not present

## 2022-06-13 DIAGNOSIS — N1832 Chronic kidney disease, stage 3b: Secondary | ICD-10-CM | POA: Diagnosis not present

## 2022-06-13 DIAGNOSIS — I2510 Atherosclerotic heart disease of native coronary artery without angina pectoris: Secondary | ICD-10-CM | POA: Diagnosis not present

## 2022-06-13 DIAGNOSIS — A419 Sepsis, unspecified organism: Secondary | ICD-10-CM | POA: Diagnosis not present

## 2022-06-13 DIAGNOSIS — J189 Pneumonia, unspecified organism: Secondary | ICD-10-CM | POA: Diagnosis not present

## 2022-06-13 DIAGNOSIS — I5043 Acute on chronic combined systolic (congestive) and diastolic (congestive) heart failure: Secondary | ICD-10-CM | POA: Diagnosis not present

## 2022-06-14 ENCOUNTER — Other Ambulatory Visit: Payer: Self-pay | Admitting: Hematology and Oncology

## 2022-06-14 ENCOUNTER — Inpatient Hospital Stay: Payer: Medicare PPO | Attending: Hematology and Oncology

## 2022-06-14 ENCOUNTER — Other Ambulatory Visit: Payer: Self-pay

## 2022-06-14 ENCOUNTER — Inpatient Hospital Stay: Payer: Medicare PPO | Admitting: Hematology and Oncology

## 2022-06-14 VITALS — BP 129/45 | HR 75 | Temp 97.7°F | Resp 15 | Wt 132.4 lb

## 2022-06-14 DIAGNOSIS — D509 Iron deficiency anemia, unspecified: Secondary | ICD-10-CM

## 2022-06-14 DIAGNOSIS — Z86718 Personal history of other venous thrombosis and embolism: Secondary | ICD-10-CM | POA: Insufficient documentation

## 2022-06-14 DIAGNOSIS — D539 Nutritional anemia, unspecified: Secondary | ICD-10-CM

## 2022-06-14 DIAGNOSIS — M069 Rheumatoid arthritis, unspecified: Secondary | ICD-10-CM | POA: Diagnosis not present

## 2022-06-14 DIAGNOSIS — Z7902 Long term (current) use of antithrombotics/antiplatelets: Secondary | ICD-10-CM | POA: Diagnosis not present

## 2022-06-14 DIAGNOSIS — D638 Anemia in other chronic diseases classified elsewhere: Secondary | ICD-10-CM | POA: Diagnosis not present

## 2022-06-14 DIAGNOSIS — Z86711 Personal history of pulmonary embolism: Secondary | ICD-10-CM | POA: Insufficient documentation

## 2022-06-14 LAB — IRON AND IRON BINDING CAPACITY (CC-WL,HP ONLY)
Iron: 211 ug/dL — ABNORMAL HIGH (ref 28–170)
Saturation Ratios: 63 % — ABNORMAL HIGH (ref 10.4–31.8)
TIBC: 335 ug/dL (ref 250–450)
UIBC: 124 ug/dL — ABNORMAL LOW (ref 148–442)

## 2022-06-14 LAB — CBC WITH DIFFERENTIAL (CANCER CENTER ONLY)
Abs Immature Granulocytes: 0.05 10*3/uL (ref 0.00–0.07)
Basophils Absolute: 0.1 10*3/uL (ref 0.0–0.1)
Basophils Relative: 1 %
Eosinophils Absolute: 1.3 10*3/uL — ABNORMAL HIGH (ref 0.0–0.5)
Eosinophils Relative: 13 %
HCT: 30.4 % — ABNORMAL LOW (ref 36.0–46.0)
Hemoglobin: 10.1 g/dL — ABNORMAL LOW (ref 12.0–15.0)
Immature Granulocytes: 1 %
Lymphocytes Relative: 14 %
Lymphs Abs: 1.4 10*3/uL (ref 0.7–4.0)
MCH: 36.3 pg — ABNORMAL HIGH (ref 26.0–34.0)
MCHC: 33.2 g/dL (ref 30.0–36.0)
MCV: 109.4 fL — ABNORMAL HIGH (ref 80.0–100.0)
Monocytes Absolute: 1.1 10*3/uL — ABNORMAL HIGH (ref 0.1–1.0)
Monocytes Relative: 11 %
Neutro Abs: 6.3 10*3/uL (ref 1.7–7.7)
Neutrophils Relative %: 60 %
Platelet Count: 224 10*3/uL (ref 150–400)
RBC: 2.78 MIL/uL — ABNORMAL LOW (ref 3.87–5.11)
RDW: 17.7 % — ABNORMAL HIGH (ref 11.5–15.5)
WBC Count: 10.3 10*3/uL (ref 4.0–10.5)
nRBC: 0 % (ref 0.0–0.2)

## 2022-06-14 LAB — RETIC PANEL
Immature Retic Fract: 27.1 % — ABNORMAL HIGH (ref 2.3–15.9)
RBC.: 2.85 MIL/uL — ABNORMAL LOW (ref 3.87–5.11)
Retic Count, Absolute: 87.5 10*3/uL (ref 19.0–186.0)
Retic Ct Pct: 3.1 % (ref 0.4–3.1)
Reticulocyte Hemoglobin: 33.2 pg (ref >27.9)

## 2022-06-14 LAB — CMP (CANCER CENTER ONLY)
ALT: 29 U/L (ref 0–44)
AST: 31 U/L (ref 15–41)
Albumin: 3.7 g/dL (ref 3.5–5.0)
Alkaline Phosphatase: 60 U/L (ref 38–126)
Anion gap: 8 (ref 5–15)
BUN: 19 mg/dL (ref 8–23)
CO2: 24 mmol/L (ref 22–32)
Calcium: 9.4 mg/dL (ref 8.9–10.3)
Chloride: 110 mmol/L (ref 98–111)
Creatinine: 1.14 mg/dL — ABNORMAL HIGH (ref 0.44–1.00)
GFR, Estimated: 51 mL/min — ABNORMAL LOW (ref >60)
Glucose, Bld: 136 mg/dL — ABNORMAL HIGH (ref 70–99)
Potassium: 4.3 mmol/L (ref 3.5–5.1)
Sodium: 142 mmol/L (ref 135–145)
Total Bilirubin: 0.6 mg/dL (ref 0.3–1.2)
Total Protein: 6.5 g/dL (ref 6.5–8.1)

## 2022-06-14 LAB — FERRITIN: Ferritin: 49 ng/mL (ref 11–307)

## 2022-06-14 MED ORDER — TRIAMCINOLONE ACETONIDE 0.5 % EX OINT: 1.0000 | TOPICAL_OINTMENT | Freq: Two times a day (BID) | CUTANEOUS | 1 refills | Status: DC

## 2022-06-14 NOTE — Telephone Encounter (Signed)
Patient requests a call back about her infusion therapy.

## 2022-06-14 NOTE — Progress Notes (Signed)
[facility] [phone]   [phone]  PROGRESS NOTE  Patient Care Team: [name], MD as PCP - General (Family Medicine) Jodelle Red, MD as PCP - Cardiology (Cardiology) Jodelle Red, MD as Consulting Physician (Cardiology)  Hematological/Oncological History #Iron deficiency anemia: 1) 02/04/2021-02/07/2021: Admitted for iron deficiency anemia after presenting with Hgb of 6.5.  Patient underwent EGD 12/18 which revealed gastric antral vascular ectasia, treated with APC. Aspirin/plavix/xarelto held during hospitalization. Patient was given 1 unit of PRBC and IV venofer 200 mg x 1. Discussed with cardiology with recommendations to stop aspirin but patient should continue on Plavix. Xarelto was also resumed due to history of recurrent DVT/PE.   2) 02/24/2021: Hgb 8.3, MCV 98.2, Plt 404K, Iron 14, Iron Saturation 3.4%, Ferritin 74.6.  3) 03/14/2021: Establish care with [facility]  4) 03/15/2021-04/12/2021: Received IV venofer 200 mg once weekly x 5 doses.   5) 09/19/2021: Labs show white blood cell count 11.0, hemoglobin 9.1, MCV 114.3, and platelets of 219   HISTORY OF PRESENTING ILLNESS:  Ebony Scott 72 y.o. female returns for a follow up for iron deficiency anemia. Her last visit was on 12/21/2021.  Since that time she is had no major changes in her health.  At today's visit, Ms. Duerksen reports in the interim since her last visit she went back to the hospital and then to rehab.  Fortunately she went home on Easter Sunday most recently.  She reports that her feet do hurt her and she is taking gabapentin which is helping with this.  She also developed a rash on her chest and upper abdomen.  She reports she has had it for about 3 to 4 weeks and that is causing some itching.  She reports that she has been eating okay but does skip meals on certain occasions.  She is not having any overt signs of bleeding, bruising, or dark  stools.  She reports her breathing is about the same and she is not having any lightheadedness, dizziness.  She reports her energy is quite poor and currently at a 3 out of 10.  She notes her RA is currently under good control.  Otherwise she is at her baseline level of health.  She denies fevers, chills, sweats, chest pain or cough. She has no other complaints. Rest of the 10 point ROS is below.    MEDICAL HISTORY:  Past Medical History:  Diagnosis Date   Abscess of dorsum of right hand 09/22/2020   Acute deep vein thrombosis (DVT) of right lower extremity 12/13/2017   Allergic rhinitis    Anemia    Angio-edema    Anxiety    pt denies dx   Arthritis    Phreesia 10/21/2019   Asthma    Phreesia 10/21/2019   Breast cancer 1998   Left breast, in remission   Bronchiectasis    Cataract    REMOVED   CHF (congestive heart failure)    Clostridium difficile colitis 01/14/2018   Complication of anesthesia    "had hard time waking up from it several times" (02/20/2012), no anesthesia problems since 2014   Coronary artery disease    Depression    "some; don't take anything for it" (02/20/2012), pt denies dx as of 06/02/21   Diverticulosis    DVT (deep venous thrombosis)    Dyspnea    all the time   Fibromyalgia 11/2011   GAVE (gastric antral vascular ectasia)    GERD (gastroesophageal reflux disease)  Graves disease    Headache(784.0)    "related to allergies; more at different times during the year" (02/20/2012)   Hemorrhoids    Hiatal hernia    back and neck   History of blood transfusion 2023   1 unit, 5 units of Iron   Hx of adenomatous colonic polyps 04/12/2016   Hypercholesteremia    good cholesterol is high   Hypothyroidism    IBS (irritable bowel syndrome)    Moderate persistent asthma    -FeV1 72% 2011, -IgE 102 2011, CT sinus Neg 2011   Osteomyelitis of second toe of right foot 09/23/2020   Osteoporosis    on reclast yearly   Peripheral vascular disease 2019   DVTs (3)  lungs and leg   Pneumonia 04/2011; ~ 11/2011   "double; single" (02/20/2012)   Pneumonia    Septic olecranon bursitis of right elbow 09/22/2020   Seronegative rheumatoid arthritis    Dr. Casimer Lanius   SIRS (systemic inflammatory response syndrome) 02/10/2018   Sleep apnea    uses cpap nightly   Tracheobronchomalacia     SURGICAL HISTORY: Past Surgical History:  Procedure Laterality Date   ABDOMINAL HYSTERECTOMY N/A    Phreesia 10/21/2019   ANTERIOR AND POSTERIOR REPAIR  1990's   APPENDECTOMY     ARGON LASER APPLICATION  02/05/2021   Procedure: ARGON LASER APPLICATION;  Surgeon: Benancio Deeds, MD;  Location: [facility];  Service: Gastroenterology;;   BREAST LUMPECTOMY  1998   left   BREAST SURGERY N/A    Phreesia 10/21/2019   BRONCHIAL WASHINGS  04/05/2020   Procedure: BRONCHIAL WASHINGS;  Surgeon: Martina Sinner, MD;  Location: [facility];  Service: Pulmonary;;   CAPSULOTOMY Right 08/28/2021   Procedure: CAPSULOTOMY;  Surgeon: Candelaria Stagers, DPM;  Location: [facility];  Service: Podiatry;  Laterality: Right;   CARPOMETACARPEL (CMC) FUSION OF THUMB WITH AUTOGRAFT FROM RADIUS  ~ 2009   "both thumbs" (02/20/2012)   CATARACT EXTRACTION W/ INTRAOCULAR LENS  IMPLANT, BILATERAL  2012   CERVICAL DISCECTOMY  10/2001   C5-C6   CERVICAL FUSION  2003   C3-C4   CHOLECYSTECTOMY     COLONOSCOPY     CORONARY STENT INTERVENTION N/A 01/11/2021   Procedure: CORONARY STENT INTERVENTION;  Surgeon: Marykay Lex, MD;  Location: [facility];  Service: Cardiovascular;  Laterality: N/A;   DEBRIDEMENT TENNIS ELBOW  ?1970's   right   ESOPHAGOGASTRODUODENOSCOPY     ESOPHAGOGASTRODUODENOSCOPY (EGD) WITH PROPOFOL N/A 02/05/2021   Procedure: ESOPHAGOGASTRODUODENOSCOPY (EGD) WITH PROPOFOL;  Surgeon: Benancio Deeds, MD;  Location: [facility];  Service: Gastroenterology;  Laterality: N/A;   EYE SURGERY N/A    Phreesia 10/21/2019   HAMMER TOE SURGERY Right 08/28/2021   Procedure:  HAMMER TOE REPAIR 2ND TOE RIGHT FOOT;  Surgeon: Candelaria Stagers, DPM;  Location: [facility];  Service: Podiatry;  Laterality: Right;   HYSTERECTOMY     I & D EXTREMITY Right 09/22/2020   Procedure: IRRIGATION AND DEBRIDEMENT RIGHT HAND AND ELBOW;  Surgeon: Teryl Lucy, MD;  Location: [facility];  Service: Orthopedics;  Laterality: Right;   KNEE ARTHROPLASTY  ?1990's   "?right; w/cartilage repair" (02/20/2012)   MASS EXCISION Left 08/28/2021   Procedure: EXCISION OF SOFT TISSUE  MASS LEFT FOOT;  Surgeon: Candelaria Stagers, DPM;  Location: [facility];  Service: Podiatry;  Laterality: Left;   NASAL SEPTUM SURGERY  1980's   POSTERIOR CERVICAL FUSION/FORAMINOTOMY  2004   "failed initial fusion; rewired  anterior  neck" (02/20/2012)   REVERSE SHOULDER ARTHROPLASTY Right 02/16/2020   Procedure: REVERSE SHOULDER ARTHROPLASTY;  Surgeon: Teryl Lucy, MD;  Location: [facility];  Service: Orthopedics;  Laterality: Right;   RIGHT/LEFT HEART CATH AND CORONARY ANGIOGRAPHY N/A 01/11/2021   Procedure: RIGHT/LEFT HEART CATH AND CORONARY ANGIOGRAPHY;  Surgeon: Marykay Lex, MD;  Location: [facility] CV [facility];  Service: Cardiovascular;  Laterality: N/A;   SPINE SURGERY N/A    Phreesia 10/21/2019   TONSILLECTOMY  ~ 1953   VESICOVAGINAL FISTULA CLOSURE W/ TAH  1988   VIDEO BRONCHOSCOPY Bilateral 08/23/2016   Procedure: VIDEO BRONCHOSCOPY WITH FLUORO;  Surgeon: Roslynn Amble, MD;  Location: [facility];  Service: Cardiopulmonary;  Laterality: Bilateral;   VIDEO BRONCHOSCOPY N/A 04/05/2020   Procedure: VIDEO BRONCHOSCOPY WITHOUT FLUORO;  Surgeon: Martina Sinner, MD;  Location: [facility];  Service: Pulmonary;  Laterality: N/A;    SOCIAL HISTORY: Social History   Socioeconomic History   Marital status: Divorced    Spouse name: Not on file   Number of children: 2   Years of education: college   Highest education level: Not on file  Occupational History   Occupation: Disabled    Comment: Retired Copywriter, advertising: RETIRED  Tobacco Use   Smoking status: Never    Passive exposure: Past   Smokeless tobacco: Never   Tobacco comments:    Parents  Vaping Use   Vaping Use: Never used  Substance and Sexual Activity   Alcohol use: Not Currently    Alcohol/week: 0.0 standard drinks of alcohol   Drug use: No   Sexual activity: Not Currently    Birth control/protection: Surgical    Comment: Hysterectomy  Other Topics Concern   Not on file  Social History Narrative   Patient lives at home alone. Patient  divorced.    Patient has her BS degree.   Right handed.   Caffeine- sometimes coffee.      [facility]:   Born in [place], [place]. She worked as a Armed forces operational officer. She has no pets currently. She does have indoor plants. Previously had mold in her home that was remediated. Carpet was removed.          Social Determinants of Health   Financial Resource Strain: Low Risk  (04/18/2022)   Overall Financial Resource Strain (CARDIA)    Difficulty of Paying Living Expenses: Not hard at all  Food Insecurity: No Food Insecurity (04/25/2022)   Hunger Vital Sign    Worried About Running Out of Food in the Last Year: Never true    Ran Out of Food in the Last Year: Never true  Transportation Needs: No Transportation Needs (04/25/2022)   PRAPARE - Administrator, Civil Service (Medical): No    Lack of Transportation (Non-Medical): No  Physical Activity: Insufficiently Active (07/13/2021)   Exercise Vital Sign    Days of Exercise per Week: 5 days    Minutes of Exercise per Session: 20 min  Stress: No Stress Concern Present (07/13/2021)   [employer] of Occupational Health - Occupational Stress Questionnaire    Feeling of Stress : Only a little  Social Connections: Moderately Isolated (07/13/2021)   Social Connection and Isolation Panel [NHANES]    Frequency of Communication with Friends and Family: More than three times a week    Frequency of Social Gatherings with  Friends and Family: More than three times a week    Attends Religious Services: 1 to 4  times per year    Active Member of Clubs or Organizations: No    Attends Banker Meetings: Never    Marital Status: Divorced  Catering manager Violence: Not At Risk (04/25/2022)   Humiliation, Afraid, Rape, and Kick questionnaire    Fear of Current or Ex-Partner: No    Emotionally Abused: No    Physically Abused: No    Sexually Abused: No    FAMILY HISTORY: Family History  Problem Relation Age of Onset   Allergies Mother    Heart disease Mother    Arthritis Mother    Lung cancer Mother        heavy smoker   Diabetes Mother    Allergies Father    Heart disease Father    Arthritis Father    Stroke Father    Heart disease Brother    Diabetes Maternal Grandfather    Colitis Daughter    Colon cancer Other        Maternal half uncle    ALLERGIES:  is allergic to baclofen, dust mite extract, molds & smuts, morphine and related, other, penicillins, rofecoxib, shellfish allergy, shrimp extract, tetracycline hcl, tetracycline hcl, xolair [omalizumab], zoledronic acid, dilaudid [hydromorphone hcl], hydrocodone-acetaminophen, levofloxacin, oxycodone hcl, oxycodone hcl, paroxetine, celecoxib, diltiazem hcl, lactose, lactose intolerance (gi), oxycodone-acetaminophen, rituximab, tree extract, gamunex [immune globulin], and penicillin g procaine.  MEDICATIONS:  Current Outpatient Medications  Medication Sig Dispense Refill   acetaminophen (TYLENOL) 650 MG CR tablet Take 1,300 mg by mouth daily as needed for pain.     acetic acid-hydrocortisone (VOSOL-HC) OTIC solution Place 4 drops into both ears See admin instructions. Instill 4 drops into both ears the first 5 days of the month, then as directed/as needed for itching     albuterol (VENTOLIN HFA) 108 (90 Base) MCG/ACT inhaler Inhale 2 puffs into the lungs every 6 (six) hours as needed for wheezing or shortness of breath. 19 g 1   allopurinol  (ZYLOPRIM) 100 MG tablet TAKE 1 TABLET BY MOUTH DAILY (Patient taking differently: Take 100 mg by mouth daily with supper.) 90 tablet 2   Alpha-D-Galactosidase (BEANO PO) Take 2 tablets by mouth 3 (three) times daily before meals.     Alpha-Lipoic Acid 600 MG TABS Take 600 mg by mouth daily.     ammonium lactate (AMLACTIN DAILY) 12 % lotion Apply 1 Application topically as needed for dry skin. 400 g 0   atorvastatin (LIPITOR) 80 MG tablet TAKE 1 TABLET BY MOUTH DAILY (Patient taking differently: Take 80 mg by mouth daily with supper.) 90 tablet 2   azelastine (ASTELIN) 0.1 % nasal spray Place 2 sprays into both nostrils daily. Use in each nostril as directed 30 mL 5   benzonatate (TESSALON) 200 MG capsule TAKE 1 CAPSULE BY MOUTH THREE TIMES DAILY AS NEEDED FOR COUGH (Patient taking differently: Take 200 mg by mouth 2 (two) times daily as needed for cough.) 30 capsule 0   Calcium-Magnesium-Vitamin D (CALCIUM MAGNESIUM PO) Take 1 tablet by mouth at bedtime.     cetirizine (ZYRTEC) 10 MG tablet Take 1 tablet (10 mg total) by mouth 2 (two) times daily as needed for allergies (Can take a n extra dose during flare ups.). (Patient taking differently: Take 10 mg by mouth at bedtime.) 60 tablet 5   Cholecalciferol (VITAMIN D3) 25 MCG (1000 UT) capsule Take 1,000 Units by mouth at bedtime.     clopidogrel (PLAVIX) 75 MG tablet Take 1 tablet (75 mg total) by mouth daily. 90  tablet 3   cyproheptadine (PERIACTIN) 4 MG tablet Take 2 tablets (8 mg total) by mouth at bedtime. Take 2 tablets at bedtime as needed to treat facial pain or sleep dysfunction. (Patient taking differently: Take 8 mg by mouth at bedtime.) 60 tablet 5   denosumab (PROLIA) 60 MG/ML SOSY injection Inject 60 mg into the skin every 6 (six) months.     dexlansoprazole (DEXILANT) 60 MG capsule Take 1 capsule (60 mg total) by mouth daily. 90 capsule 3   empagliflozin (JARDIANCE) 10 MG TABS tablet Take 1 tablet (10 mg total) by mouth daily before  breakfast. 90 tablet 1   EPINEPHrine 0.3 mg/0.3 mL IJ SOAJ injection USE AS DIRECTED BY YOUR PHYSICIAN INTRAMUSCULARLY AS NEEDED FOR ANAPHYLAXIS (Patient taking differently: Inject 0.3 mg into the muscle as needed for anaphylaxis.) 2 each 1   famotidine (PEPCID) 20 MG tablet TAKE 1 TABLET BY MOUTH EACH NIGHT AT BEDTIME (Patient taking differently: Take 20 mg by mouth at bedtime.) 30 tablet 1   fluticasone (FLONASE) 50 MCG/ACT nasal spray USE 2 SPRAYS IN EACH NOSTRIL DAILY (Patient taking differently: Place 2 sprays into both nostrils daily.) 16 g 2   folic acid (FOLVITE) 1 MG tablet Take 1 tablet (1 mg total) by mouth daily. 90 tablet 3   furosemide (LASIX) 40 MG tablet Take 1 tablet (40 mg total) by mouth daily. (Patient taking differently: Take 40 mg by mouth in the morning.) 90 tablet 2   gabapentin (NEURONTIN) 300 MG capsule Take 1 capsule (300 mg total) by mouth at bedtime.     GOODSENSE ARTHRITIS PAIN 1 % GEL Apply 1 Application topically 4 times daily as needed for pain. 100 g 3   guaiFENesin (MUCINEX) 600 MG 12 hr tablet Take 1 tablet (600 mg total) by mouth 2 (two) times daily as needed for cough or to loosen phlegm. (Patient taking differently: Take 600 mg by mouth 2 (two) times daily.) 60 tablet 1   HIZENTRA 1 GM/5ML SOLN 13 g by Subcutaneous Infusion route See admin instructions. On hold while at rehab. Hizentra- 13 g infused every Friday  ("3 times/5 ml = 65 ml total once weekly")     ipratropium-albuterol (DUONEB) 0.5-2.5 (3) MG/3ML SOLN Take 3 mLs by nebulization every 6 (six) hours as needed. 360 mL 6   isosorbide-hydrALAZINE (BIDIL) 20-37.5 MG tablet Take 1 tablet by mouth 2 (two) times daily. (Patient taking differently: Take 1 tablet by mouth daily with supper.) 180 tablet 1   Lactase (LACTOSE FAST ACTING RELIEF PO) Take 3 tablets by mouth 3 (three) times daily before meals.     lidocaine-prilocaine (EMLA) cream Apply 1 application  topically as needed (pain).     magnesium oxide  (MAG-OX) 400 (240 Mg) MG tablet Take 400 mg by mouth at bedtime.     Menthol, Topical Analgesic, (BIOFREEZE) 4 % GEL Apply 1 application  topically daily as needed (for shoulder or joint pain).     mirtazapine (REMERON SOL-TAB) 30 MG disintegrating tablet Take 1 tablet (30 mg total) by mouth at bedtime as needed (sleep). (Patient taking differently: Take 30 mg by mouth at bedtime.) 90 tablet 3   montelukast (SINGULAIR) 10 MG tablet Take 1 tablet (10 mg total) by mouth daily. (Patient taking differently: Take 10 mg by mouth daily after supper.) 90 tablet 1   Nystatin (GERHARDT'S BUTT CREAM) CREA Apply topically 3 times a day as needed for itching or irritation 4 each 6   nystatin (MYCOSTATIN) 100000 UNIT/ML suspension  Use as directed 15 mLs in the mouth or throat at bedtime.     potassium chloride (KLOR-CON M) 10 MEQ tablet Take 1 tablet (10 mEq total) by mouth daily. (Patient taking differently: Take 10 mEq by mouth daily with supper.) 90 tablet 1   Prenatal Vit-Fe Fumarate-FA (PRENATAL VITAMINS PO) Take 1 tablet by mouth daily.     Probiotic Product (ALIGN PO) Take 1 capsule by mouth daily.     Propylene Glycol (SYSTANE COMPLETE) 0.6 % SOLN Place 1 drop into both eyes 2 (two) times daily. 15 mL 5   sacubitril-valsartan (ENTRESTO) 24-26 MG Take 1 tablet by mouth 2 (two) times daily. 60 tablet    Tiotropium Bromide-Olodaterol (STIOLTO RESPIMAT) 2.5-2.5 MCG/ACT AERS Inhale 2 puffs into the lungs daily. (Patient taking differently: Inhale 2 puffs into the lungs at bedtime.) 4 g 11   traMADol (ULTRAM) 50 MG tablet Take 1 tablet (50 mg total) by mouth at bedtime. 10 tablet 0   Triamcinolone Acetonide (TRIAMCINOLONE 0.1 % CREAM : EUCERIN) CREA Apply 1 Application topically 2 (two) times daily. 1 each 1   triamcinolone ointment (KENALOG) 0.1 % Apply 1 Application topically 2 (two) times daily as needed. 80 g 0   XARELTO 20 MG TABS tablet TAKE 1 TABLET BY MOUTH DAILY WITH SUPPER (Patient taking differently:  Take 20 mg by mouth daily with supper.) 90 tablet 2   No current facility-administered medications for this visit.   Facility-Administered Medications Ordered in Other Visits  Medication Dose Route Frequency Provider Last Rate Last Admin   iohexol (OMNIPAQUE) 350 MG/ML injection    PRN Marykay Lex, MD   105 mL at 01/11/21 0955    REVIEW OF SYSTEMS:   Constitutional: ( - ) fevers, ( - )  chills , ( - ) night sweats Eyes: ( - ) blurriness of vision, ( - ) double vision, ( - ) watery eyes Ears, nose, mouth, throat, and face: ( - ) mucositis, ( - ) sore throat Respiratory: ( - ) cough, ( + ) dyspnea, ( - ) wheezes Cardiovascular: ( - ) palpitation, ( - ) chest discomfort, ( - ) lower extremity swelling Gastrointestinal:  ( - ) nausea, ( - ) heartburn, ( - ) change in bowel habits Skin: ( - ) abnormal skin rashes Lymphatics: ( - ) new lymphadenopathy, ( - ) easy bruising Neurological: ( + ) numbness, ( - ) tingling, ( - ) new weaknesses Behavioral/Psych: ( - ) mood change, ( - ) new changes  All other systems were reviewed with the patient and are negative.  PHYSICAL EXAMINATION: ECOG PERFORMANCE STATUS: 2 - Symptomatic, <50% confined to bed  There were no vitals filed for this visit.   There were no vitals filed for this visit.    GENERAL: well appearing female in NAD.  Has moon facies classic for steroid use. SKIN: skin color, texture, turgor are normal, no rashes or significant lesions EYES: conjunctiva are pink and non-injected, sclera clear LUNGS: clear to auscultation and percussion with normal breathing effort HEART: regular rate & rhythm and no murmurs and no lower extremity edema Musculoskeletal: no cyanosis of digits and no clubbing  PSYCH: alert & oriented x 3, fluent speech NEURO: no focal motor/sensory deficits  LABORATORY DATA:  I have reviewed the data as listed    Latest Ref Rng & Units 05/29/2022   12:28 PM 05/02/2022    5:42 AM 05/01/2022    5:40 AM  CBC   WBC 3.8 -  10.8 Thousand/uL 13.7  11.7  10.7   Hemoglobin 11.7 - 15.5 g/dL 16.1  09.6  04.5   Hematocrit 35.0 - 45.0 % 30.2  31.0  34.4   Platelets 140 - 400 Thousand/uL 216  215  208        Latest Ref Rng & Units 05/29/2022   12:28 PM 05/02/2022    5:42 AM 05/01/2022    5:40 AM  CMP  Glucose 65 - 99 mg/dL 409  811  914   BUN 7 - 25 mg/dL 29  40  28   Creatinine 0.60 - 1.00 mg/dL 7.82  9.56  2.13   Sodium 135 - 146 mmol/L 142  142  141   Potassium 3.5 - 5.3 mmol/L 4.9  3.9  4.0   Chloride 98 - 110 mmol/L 105  107  107   CO2 20 - 32 mmol/L 26  23  22    Calcium 8.6 - 10.4 mg/dL 9.4  8.2  8.1   Total Protein 6.1 - 8.1 g/dL 6.1   6.5   Total Bilirubin 0.2 - 1.2 mg/dL 0.5   0.4   Alkaline Phos 38 - 126 U/L   46   AST 10 - 35 U/L 53   34   ALT 6 - 29 U/L 59   21     ASSESSMENT & PLAN LIBIA FAZZINI is a 72 y.o. female who returns for a follow up for iron deficiency anemia.   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  --Under the care of [facility], last seen on 02/24/2021. Plan to hold off on further endoscopic evaluation for at least 6 months post her stent placement in order to safely take her off her blood thinners/antiplatelet agents at that time. --Unable to tolerate iron pills due to constipation --Received IV venofer 200 mg x 5 doses from 03/15/2021-04/12/2021 Plan: --Labs from today are stable from prior.  Labs show white blood cell count 10.3, hemoglobin 10.1, MCV 109.4, and platelets of 224.  Iron studies appear to be within normal limits. --bone marrow biopsy showed some mild dysplasia with negative MDS FISH panel --if Hgb is persistently below 10 can consider EPO administration.  --No additional IV iron needed at this time.  --RTC PRN --At this time her blood counts have been stable and she has not required any further intervention for over a  year.  I would recommend continued monitoring of her blood counts with the rheumatologist and PCP.  If she were to have persistently low hemoglobin or worsening hematological abnormalities were happy to see her back.  #Fatigue: --Checked TSH levels previously in April 2023, found to be normal. --Consider follow up with PCP to further evaluate other etiologies for fatigue since anemia has improved.   # Hx of DVT and PE: --First episode of acute DVT involving the left posterior tibial and pertoneal veins on 02/23/2012.  --Second episode acute DVT involving right peroneal vein and bilateral pulmonary emboli on 12/13/2017. Felt to be unprovoked so recommended indefinite anticoagulation.  --Currently on Xarelto 20 mg once daily.   #Seronegative rheumatoid arthritis. --Currently on Prednisone 7 mg daily.    # CVID/hypogammaglobulinemia  --Receiving IVIG tx   No orders of the defined types were placed in this encounter.   All questions were answered. The patient knows to call the clinic with any problems, questions or concerns.  I have spent a total of 30 minutes minutes of face-to-face and non-face-to-face time, preparing to see the patient,  performing a medically appropriate examination, counseling and educating the patient, documenting clinical information in the electronic health record, and care coordination.   Ulysees Barns, MD Department of Hematology/Oncology [facility] Phone: [phone] Pager: [phone] Email: [email or web address]

## 2022-06-15 DIAGNOSIS — I5032 Chronic diastolic (congestive) heart failure: Secondary | ICD-10-CM | POA: Diagnosis not present

## 2022-06-15 DIAGNOSIS — G4736 Sleep related hypoventilation in conditions classified elsewhere: Secondary | ICD-10-CM | POA: Diagnosis not present

## 2022-06-15 DIAGNOSIS — G4733 Obstructive sleep apnea (adult) (pediatric): Secondary | ICD-10-CM | POA: Diagnosis not present

## 2022-06-15 DIAGNOSIS — R0602 Shortness of breath: Secondary | ICD-10-CM | POA: Diagnosis not present

## 2022-06-15 DIAGNOSIS — J4540 Moderate persistent asthma, uncomplicated: Secondary | ICD-10-CM | POA: Diagnosis not present

## 2022-06-15 DIAGNOSIS — M0600 Rheumatoid arthritis without rheumatoid factor, unspecified site: Secondary | ICD-10-CM | POA: Diagnosis not present

## 2022-06-15 DIAGNOSIS — J45909 Unspecified asthma, uncomplicated: Secondary | ICD-10-CM | POA: Diagnosis not present

## 2022-06-15 DIAGNOSIS — G894 Chronic pain syndrome: Secondary | ICD-10-CM | POA: Diagnosis not present

## 2022-06-15 DIAGNOSIS — J471 Bronchiectasis with (acute) exacerbation: Secondary | ICD-10-CM | POA: Diagnosis not present

## 2022-06-18 ENCOUNTER — Telehealth: Payer: Self-pay

## 2022-06-18 ENCOUNTER — Other Ambulatory Visit: Payer: Self-pay

## 2022-06-18 DIAGNOSIS — N1832 Chronic kidney disease, stage 3b: Secondary | ICD-10-CM | POA: Diagnosis not present

## 2022-06-18 DIAGNOSIS — D631 Anemia in chronic kidney disease: Secondary | ICD-10-CM | POA: Diagnosis not present

## 2022-06-18 DIAGNOSIS — I130 Hypertensive heart and chronic kidney disease with heart failure and stage 1 through stage 4 chronic kidney disease, or unspecified chronic kidney disease: Secondary | ICD-10-CM | POA: Diagnosis not present

## 2022-06-18 DIAGNOSIS — A419 Sepsis, unspecified organism: Secondary | ICD-10-CM | POA: Diagnosis not present

## 2022-06-18 DIAGNOSIS — I5043 Acute on chronic combined systolic (congestive) and diastolic (congestive) heart failure: Secondary | ICD-10-CM | POA: Diagnosis not present

## 2022-06-18 DIAGNOSIS — E1122 Type 2 diabetes mellitus with diabetic chronic kidney disease: Secondary | ICD-10-CM | POA: Diagnosis not present

## 2022-06-18 DIAGNOSIS — J189 Pneumonia, unspecified organism: Secondary | ICD-10-CM | POA: Diagnosis not present

## 2022-06-18 DIAGNOSIS — M810 Age-related osteoporosis without current pathological fracture: Secondary | ICD-10-CM

## 2022-06-18 DIAGNOSIS — I2510 Atherosclerotic heart disease of native coronary artery without angina pectoris: Secondary | ICD-10-CM | POA: Diagnosis not present

## 2022-06-18 DIAGNOSIS — E242 Drug-induced Cushing's syndrome: Secondary | ICD-10-CM

## 2022-06-18 DIAGNOSIS — E0500 Thyrotoxicosis with diffuse goiter without thyrotoxic crisis or storm: Secondary | ICD-10-CM

## 2022-06-18 DIAGNOSIS — N179 Acute kidney failure, unspecified: Secondary | ICD-10-CM | POA: Diagnosis not present

## 2022-06-18 IMAGING — US US RENAL
1 series · 14 of 25 positions shown · non-contrast
Comparison: CT Abdomen and Pelvis 02/10/2018. Renal ultrasound
07/03/2019.

CLINICAL DATA: 69-year-old female with stage III B chronic kidney
disease.

EXAM:
RENAL / URINARY TRACT ULTRASOUND COMPLETE

[Series 1: us renal · 0.23mm/px · 14 of 43 slices shown]
[im 1/43]
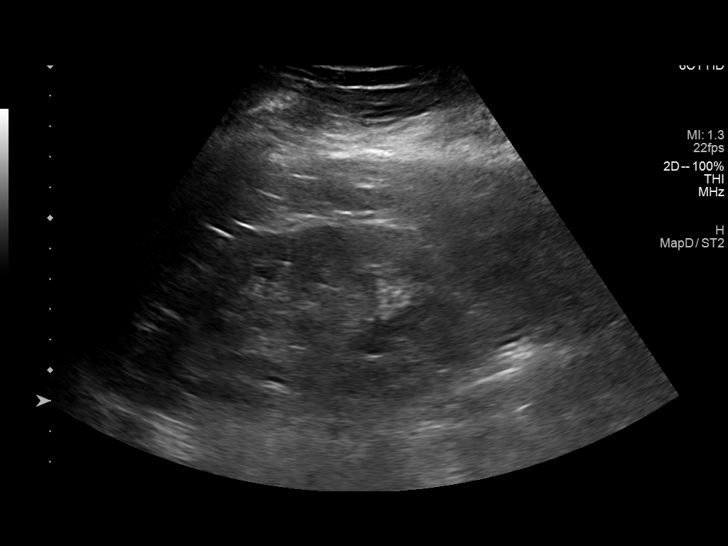
[im 4/43]
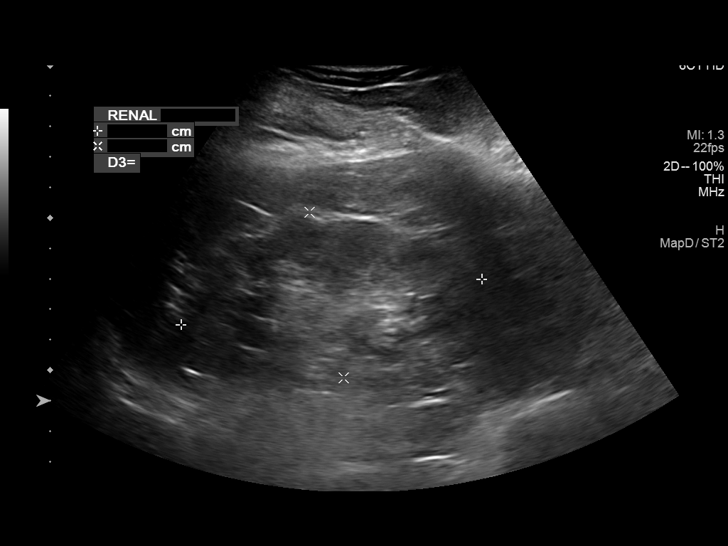
[im 8/43]
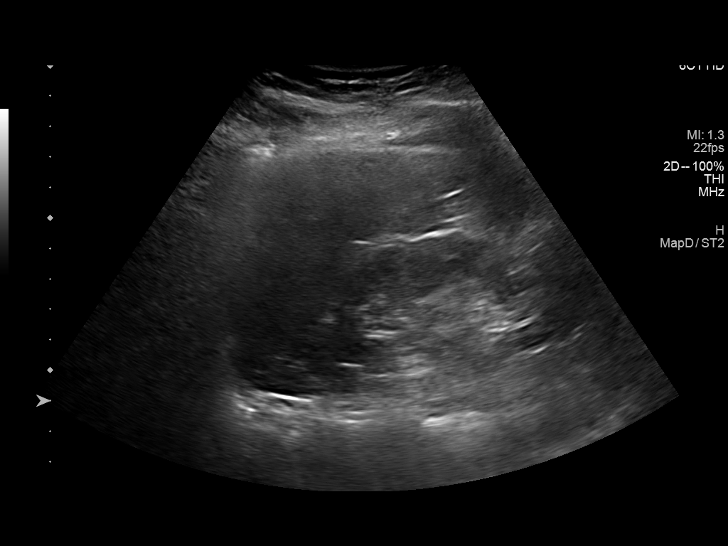
[im 11/43]
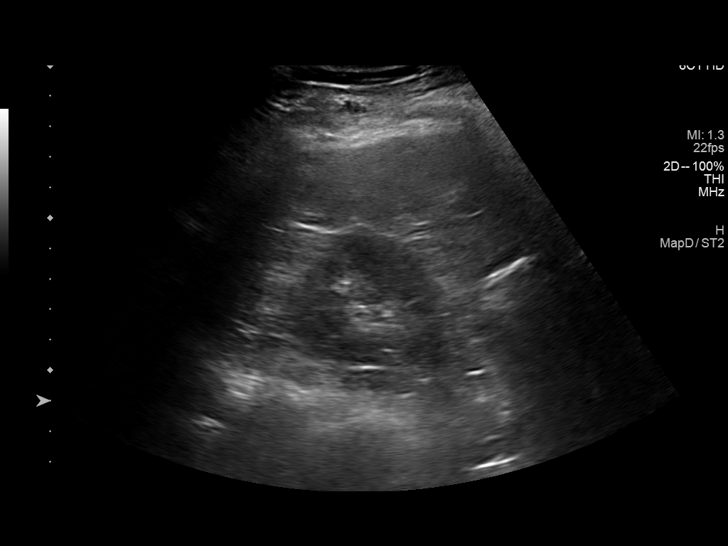
[im 15/43]
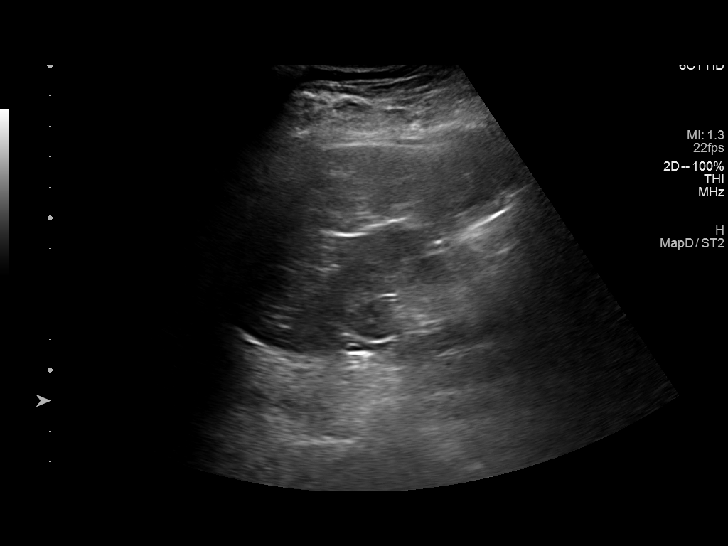
[im 16/43]
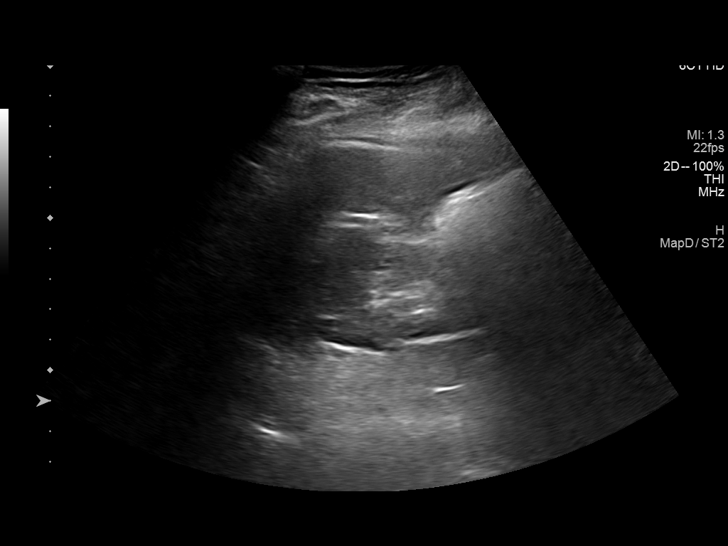
[im 20/43]
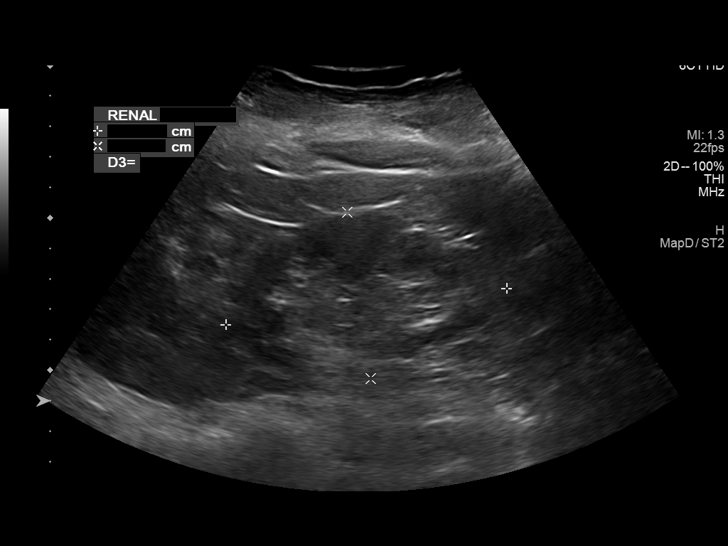
[im 23/43]
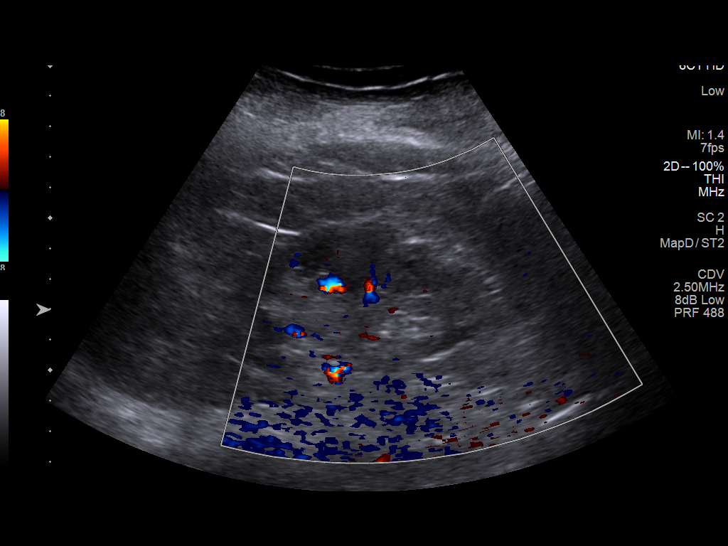
[im 27/43]
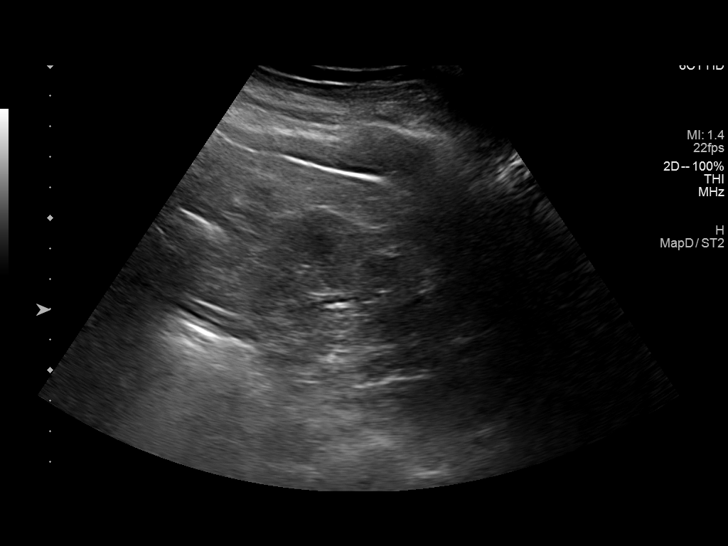
[im 29/43]
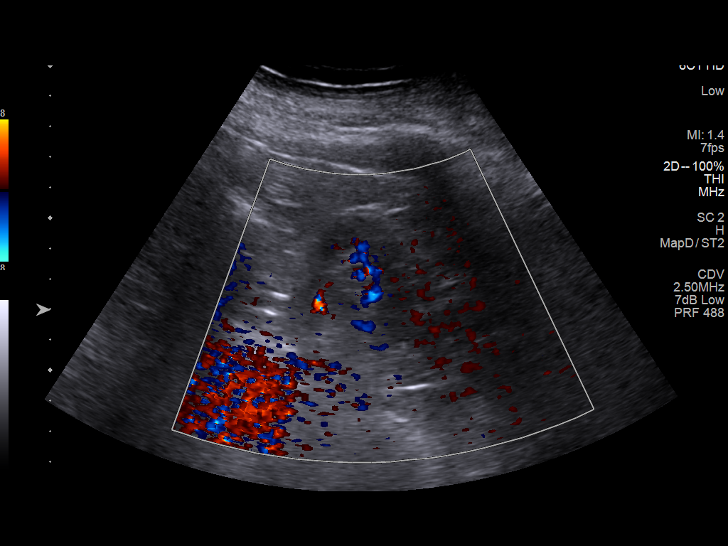
[im 32/43]
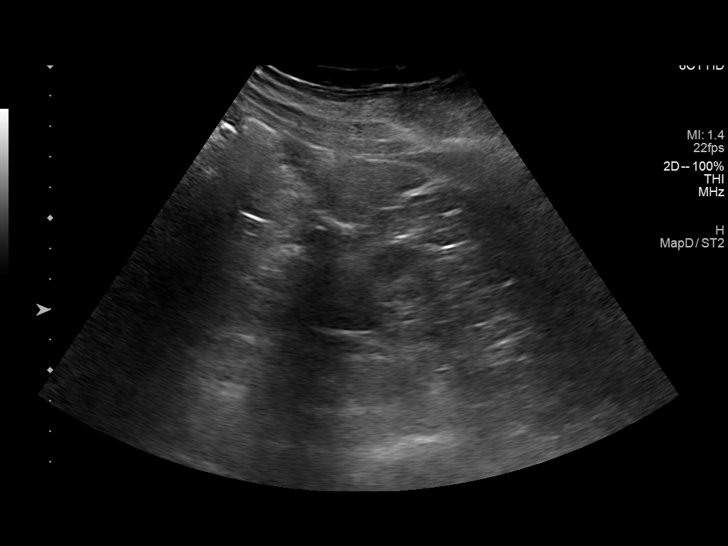
[im 36/43]
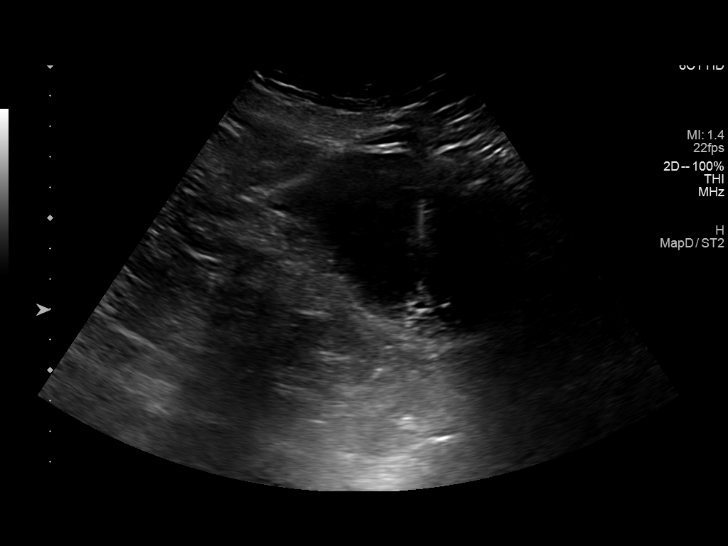
[im 39/43]
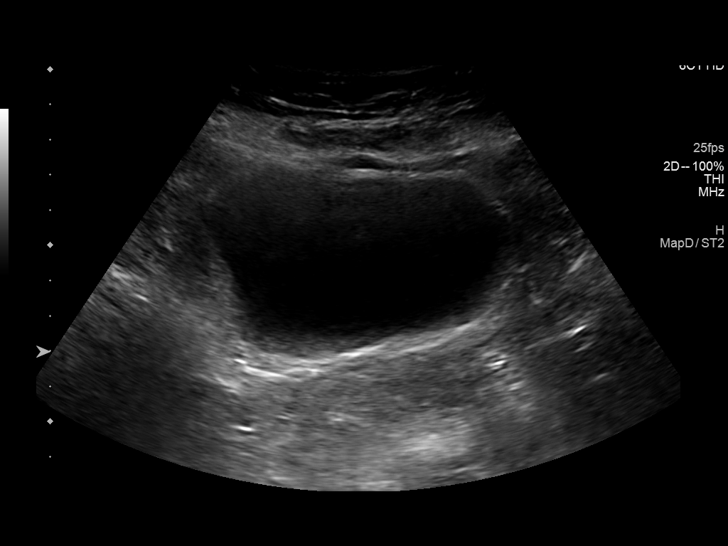
[im 43/43]
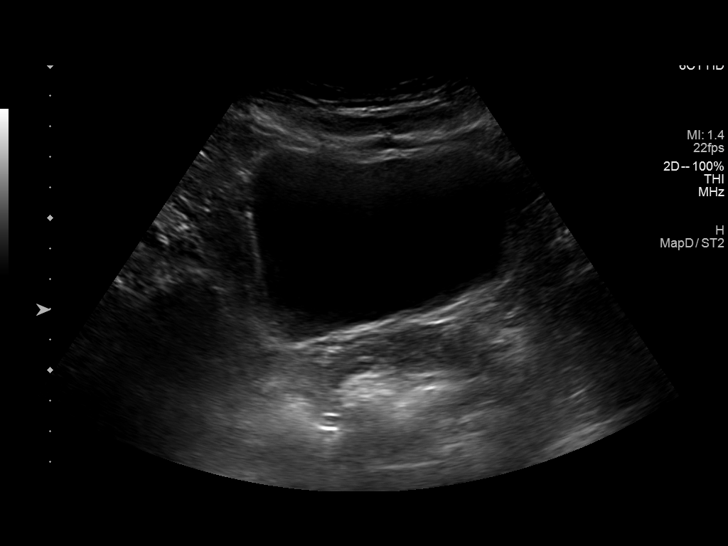

[14 of 25 positions shown; findings below may reference images not displayed]

FINDINGS: Right Kidney:

Renal measurements: 10.0 x 5.6 x 4.6 cm = volume: 134 mL (not
significantly changed). Stable cortical echogenicity. No
hydronephrosis or right renal lesion.

Left Kidney:

Renal measurements: 9.3 x 5.5 x 5.0 cm = volume: 134 mL (probably
underestimated on the prior ultrasound). Left renal cortical
echogenicity remains normal. No hydronephrosis or renal lesion.

Bladder:

Appears normal for degree of bladder distention.

Other:

None.
IMPRESSION: Stable and negative ultrasound appearance of both kidneys and the
urinary bladder.

## 2022-06-18 NOTE — Telephone Encounter (Signed)
Received form for [employer] therapy. Will get this faxed back over in the morning. Please advise to infusion issues for the patient, thank you mam.

## 2022-06-18 NOTE — Telephone Encounter (Signed)
Pt has not had a Prolia injection in over a year due to multiple hospitalizations. Pt would like to restart. Last DEXA was 10/01/2018. Should we repeat her DEXA first? Thanks.

## 2022-06-18 NOTE — Telephone Encounter (Signed)
Forms have been faxed to [phone] for [employer]. Forms have been labeled and placed in bulk scanning.

## 2022-06-19 DIAGNOSIS — I130 Hypertensive heart and chronic kidney disease with heart failure and stage 1 through stage 4 chronic kidney disease, or unspecified chronic kidney disease: Secondary | ICD-10-CM | POA: Diagnosis not present

## 2022-06-19 DIAGNOSIS — J189 Pneumonia, unspecified organism: Secondary | ICD-10-CM | POA: Diagnosis not present

## 2022-06-19 DIAGNOSIS — N179 Acute kidney failure, unspecified: Secondary | ICD-10-CM | POA: Diagnosis not present

## 2022-06-19 DIAGNOSIS — I5043 Acute on chronic combined systolic (congestive) and diastolic (congestive) heart failure: Secondary | ICD-10-CM | POA: Diagnosis not present

## 2022-06-19 DIAGNOSIS — N1832 Chronic kidney disease, stage 3b: Secondary | ICD-10-CM | POA: Diagnosis not present

## 2022-06-19 DIAGNOSIS — A419 Sepsis, unspecified organism: Secondary | ICD-10-CM | POA: Diagnosis not present

## 2022-06-19 DIAGNOSIS — E1122 Type 2 diabetes mellitus with diabetic chronic kidney disease: Secondary | ICD-10-CM | POA: Diagnosis not present

## 2022-06-19 DIAGNOSIS — I2510 Atherosclerotic heart disease of native coronary artery without angina pectoris: Secondary | ICD-10-CM | POA: Diagnosis not present

## 2022-06-19 DIAGNOSIS — D631 Anemia in chronic kidney disease: Secondary | ICD-10-CM | POA: Diagnosis not present

## 2022-06-19 NOTE — Telephone Encounter (Signed)
Called patient to determine what she was requesting regarding infusions she advised she wanted to know how much longer she will be on therapy. I advised patient she needs MD appt to discuss with provider and appt made

## 2022-06-21 ENCOUNTER — Other Ambulatory Visit: Payer: Medicare PPO

## 2022-06-21 ENCOUNTER — Ambulatory Visit: Payer: Medicare PPO | Admitting: Hematology and Oncology

## 2022-06-22 DIAGNOSIS — M0600 Rheumatoid arthritis without rheumatoid factor, unspecified site: Secondary | ICD-10-CM | POA: Diagnosis not present

## 2022-06-22 DIAGNOSIS — I509 Heart failure, unspecified: Secondary | ICD-10-CM | POA: Diagnosis not present

## 2022-06-22 DIAGNOSIS — M109 Gout, unspecified: Secondary | ICD-10-CM | POA: Diagnosis not present

## 2022-06-22 DIAGNOSIS — N1831 Chronic kidney disease, stage 3a: Secondary | ICD-10-CM | POA: Diagnosis not present

## 2022-06-22 DIAGNOSIS — D631 Anemia in chronic kidney disease: Secondary | ICD-10-CM | POA: Diagnosis not present

## 2022-06-25 DIAGNOSIS — D631 Anemia in chronic kidney disease: Secondary | ICD-10-CM | POA: Diagnosis not present

## 2022-06-25 DIAGNOSIS — N1832 Chronic kidney disease, stage 3b: Secondary | ICD-10-CM | POA: Diagnosis not present

## 2022-06-25 DIAGNOSIS — I130 Hypertensive heart and chronic kidney disease with heart failure and stage 1 through stage 4 chronic kidney disease, or unspecified chronic kidney disease: Secondary | ICD-10-CM | POA: Diagnosis not present

## 2022-06-25 DIAGNOSIS — I2510 Atherosclerotic heart disease of native coronary artery without angina pectoris: Secondary | ICD-10-CM | POA: Diagnosis not present

## 2022-06-25 DIAGNOSIS — I5043 Acute on chronic combined systolic (congestive) and diastolic (congestive) heart failure: Secondary | ICD-10-CM | POA: Diagnosis not present

## 2022-06-25 DIAGNOSIS — E1122 Type 2 diabetes mellitus with diabetic chronic kidney disease: Secondary | ICD-10-CM | POA: Diagnosis not present

## 2022-06-25 DIAGNOSIS — A419 Sepsis, unspecified organism: Secondary | ICD-10-CM | POA: Diagnosis not present

## 2022-06-25 DIAGNOSIS — J189 Pneumonia, unspecified organism: Secondary | ICD-10-CM | POA: Diagnosis not present

## 2022-06-25 DIAGNOSIS — N179 Acute kidney failure, unspecified: Secondary | ICD-10-CM | POA: Diagnosis not present

## 2022-06-27 DIAGNOSIS — J4550 Severe persistent asthma, uncomplicated: Secondary | ICD-10-CM | POA: Diagnosis not present

## 2022-06-27 DIAGNOSIS — I82509 Chronic embolism and thrombosis of unspecified deep veins of unspecified lower extremity: Secondary | ICD-10-CM | POA: Diagnosis not present

## 2022-06-27 DIAGNOSIS — I272 Pulmonary hypertension, unspecified: Secondary | ICD-10-CM | POA: Diagnosis not present

## 2022-06-27 DIAGNOSIS — I2510 Atherosclerotic heart disease of native coronary artery without angina pectoris: Secondary | ICD-10-CM | POA: Diagnosis not present

## 2022-06-27 DIAGNOSIS — F419 Anxiety disorder, unspecified: Secondary | ICD-10-CM | POA: Diagnosis not present

## 2022-06-27 DIAGNOSIS — E1122 Type 2 diabetes mellitus with diabetic chronic kidney disease: Secondary | ICD-10-CM | POA: Diagnosis not present

## 2022-06-27 DIAGNOSIS — I700 Atherosclerosis of aorta: Secondary | ICD-10-CM | POA: Diagnosis not present

## 2022-06-27 DIAGNOSIS — R32 Unspecified urinary incontinence: Secondary | ICD-10-CM | POA: Diagnosis not present

## 2022-06-27 DIAGNOSIS — M810 Age-related osteoporosis without current pathological fracture: Secondary | ICD-10-CM | POA: Diagnosis not present

## 2022-06-27 DIAGNOSIS — E1142 Type 2 diabetes mellitus with diabetic polyneuropathy: Secondary | ICD-10-CM | POA: Diagnosis not present

## 2022-06-27 DIAGNOSIS — I130 Hypertensive heart and chronic kidney disease with heart failure and stage 1 through stage 4 chronic kidney disease, or unspecified chronic kidney disease: Secondary | ICD-10-CM | POA: Diagnosis not present

## 2022-06-27 DIAGNOSIS — J439 Emphysema, unspecified: Secondary | ICD-10-CM | POA: Diagnosis not present

## 2022-06-27 DIAGNOSIS — Z7901 Long term (current) use of anticoagulants: Secondary | ICD-10-CM | POA: Diagnosis not present

## 2022-06-27 DIAGNOSIS — M0630 Rheumatoid nodule, unspecified site: Secondary | ICD-10-CM | POA: Diagnosis not present

## 2022-06-27 DIAGNOSIS — R2681 Unsteadiness on feet: Secondary | ICD-10-CM | POA: Diagnosis not present

## 2022-06-27 DIAGNOSIS — Z833 Family history of diabetes mellitus: Secondary | ICD-10-CM | POA: Diagnosis not present

## 2022-06-27 DIAGNOSIS — M1030 Gout due to renal impairment, unspecified site: Secondary | ICD-10-CM | POA: Diagnosis not present

## 2022-06-27 DIAGNOSIS — E785 Hyperlipidemia, unspecified: Secondary | ICD-10-CM | POA: Diagnosis not present

## 2022-06-27 DIAGNOSIS — K219 Gastro-esophageal reflux disease without esophagitis: Secondary | ICD-10-CM | POA: Diagnosis not present

## 2022-06-27 DIAGNOSIS — Z961 Presence of intraocular lens: Secondary | ICD-10-CM | POA: Diagnosis not present

## 2022-06-27 DIAGNOSIS — Z8249 Family history of ischemic heart disease and other diseases of the circulatory system: Secondary | ICD-10-CM | POA: Diagnosis not present

## 2022-06-27 DIAGNOSIS — Z5986 Financial insecurity: Secondary | ICD-10-CM | POA: Diagnosis not present

## 2022-06-27 DIAGNOSIS — M1990 Unspecified osteoarthritis, unspecified site: Secondary | ICD-10-CM | POA: Diagnosis not present

## 2022-06-27 DIAGNOSIS — F320 Major depressive disorder, single episode, mild: Secondary | ICD-10-CM | POA: Diagnosis not present

## 2022-06-27 DIAGNOSIS — G4733 Obstructive sleep apnea (adult) (pediatric): Secondary | ICD-10-CM | POA: Diagnosis not present

## 2022-06-27 DIAGNOSIS — Z8701 Personal history of pneumonia (recurrent): Secondary | ICD-10-CM | POA: Diagnosis not present

## 2022-06-28 ENCOUNTER — Ambulatory Visit: Payer: Medicare PPO | Admitting: Pulmonary Disease

## 2022-06-28 DIAGNOSIS — J479 Bronchiectasis, uncomplicated: Secondary | ICD-10-CM | POA: Diagnosis not present

## 2022-06-28 DIAGNOSIS — R059 Cough, unspecified: Secondary | ICD-10-CM | POA: Diagnosis not present

## 2022-06-28 DIAGNOSIS — J45901 Unspecified asthma with (acute) exacerbation: Secondary | ICD-10-CM | POA: Diagnosis not present

## 2022-06-28 DIAGNOSIS — J398 Other specified diseases of upper respiratory tract: Secondary | ICD-10-CM | POA: Diagnosis not present

## 2022-06-28 DIAGNOSIS — J4551 Severe persistent asthma with (acute) exacerbation: Secondary | ICD-10-CM | POA: Diagnosis not present

## 2022-06-28 LAB — PULMONARY FUNCTION TEST
DL/VA % pred: 95 %
DL/VA: 4.01 ml/min/mmHg/L
DLCO cor % pred: 70 %
DLCO cor: 12.14 ml/min/mmHg
DLCO unc % pred: 61 %
DLCO unc: 10.7 ml/min/mmHg
FEF 25-75 Post: 2.34 L/sec
FEF 25-75 Pre: 1.87 L/sec
FEF2575-%Change-Post: 25 %
FEF2575-%Pred-Post: 151 %
FEF2575-%Pred-Pre: 121 %
FEV1-%Change-Post: 12 %
FEV1-%Pred-Post: 88 %
FEV1-%Pred-Pre: 78 %
FEV1-Post: 1.64 L
FEV1-Pre: 1.46 L
FEV1FVC-%Change-Post: -1 %
FEV1FVC-%Pred-Pre: 120 %
FEV6-%Change-Post: 13 %
FEV6-%Pred-Post: 77 %
FEV6-%Pred-Pre: 68 %
FEV6-Post: 1.84 L
FEV6-Pre: 1.62 L
FEV6FVC-%Pred-Post: 105 %
FEV6FVC-%Pred-Pre: 105 %
FVC-%Change-Post: 13 %
FVC-%Pred-Post: 73 %
FVC-%Pred-Pre: 65 %
FVC-Post: 1.84 L
FVC-Pre: 1.62 L
Post FEV1/FVC ratio: 89 %
Post FEV6/FVC ratio: 100 %
Pre FEV1/FVC ratio: 90 %
Pre FEV6/FVC Ratio: 100 %
RV % pred: 72 %
RV: 1.54 L
TLC % pred: 70 %
TLC: 3.24 L

## 2022-06-28 NOTE — Progress Notes (Signed)
Full PFT performed today. °

## 2022-06-28 NOTE — Patient Instructions (Signed)
Full PFT performed today. °

## 2022-06-29 ENCOUNTER — Other Ambulatory Visit: Payer: Self-pay | Admitting: Internal Medicine

## 2022-07-02 DIAGNOSIS — N1832 Chronic kidney disease, stage 3b: Secondary | ICD-10-CM | POA: Diagnosis not present

## 2022-07-02 DIAGNOSIS — J189 Pneumonia, unspecified organism: Secondary | ICD-10-CM | POA: Diagnosis not present

## 2022-07-02 DIAGNOSIS — I5043 Acute on chronic combined systolic (congestive) and diastolic (congestive) heart failure: Secondary | ICD-10-CM | POA: Diagnosis not present

## 2022-07-02 DIAGNOSIS — E1122 Type 2 diabetes mellitus with diabetic chronic kidney disease: Secondary | ICD-10-CM | POA: Diagnosis not present

## 2022-07-02 DIAGNOSIS — N179 Acute kidney failure, unspecified: Secondary | ICD-10-CM | POA: Diagnosis not present

## 2022-07-02 DIAGNOSIS — I130 Hypertensive heart and chronic kidney disease with heart failure and stage 1 through stage 4 chronic kidney disease, or unspecified chronic kidney disease: Secondary | ICD-10-CM | POA: Diagnosis not present

## 2022-07-02 DIAGNOSIS — I2510 Atherosclerotic heart disease of native coronary artery without angina pectoris: Secondary | ICD-10-CM | POA: Diagnosis not present

## 2022-07-02 DIAGNOSIS — A419 Sepsis, unspecified organism: Secondary | ICD-10-CM | POA: Diagnosis not present

## 2022-07-02 DIAGNOSIS — D631 Anemia in chronic kidney disease: Secondary | ICD-10-CM | POA: Diagnosis not present

## 2022-07-03 DIAGNOSIS — I5043 Acute on chronic combined systolic (congestive) and diastolic (congestive) heart failure: Secondary | ICD-10-CM | POA: Diagnosis not present

## 2022-07-03 DIAGNOSIS — J189 Pneumonia, unspecified organism: Secondary | ICD-10-CM | POA: Diagnosis not present

## 2022-07-03 DIAGNOSIS — E1122 Type 2 diabetes mellitus with diabetic chronic kidney disease: Secondary | ICD-10-CM | POA: Diagnosis not present

## 2022-07-03 DIAGNOSIS — N179 Acute kidney failure, unspecified: Secondary | ICD-10-CM | POA: Diagnosis not present

## 2022-07-03 DIAGNOSIS — I2510 Atherosclerotic heart disease of native coronary artery without angina pectoris: Secondary | ICD-10-CM | POA: Diagnosis not present

## 2022-07-03 DIAGNOSIS — A419 Sepsis, unspecified organism: Secondary | ICD-10-CM | POA: Diagnosis not present

## 2022-07-03 DIAGNOSIS — D631 Anemia in chronic kidney disease: Secondary | ICD-10-CM | POA: Diagnosis not present

## 2022-07-03 DIAGNOSIS — N1832 Chronic kidney disease, stage 3b: Secondary | ICD-10-CM | POA: Diagnosis not present

## 2022-07-03 DIAGNOSIS — I130 Hypertensive heart and chronic kidney disease with heart failure and stage 1 through stage 4 chronic kidney disease, or unspecified chronic kidney disease: Secondary | ICD-10-CM | POA: Diagnosis not present

## 2022-07-03 MED ORDER — TRIAMCINOLONE ACETONIDE 40 MG/ML IJ SUSP
40.0000 mg | INTRAMUSCULAR | Status: AC | PRN
Start: 2022-05-22 — End: 2022-05-22
  Administered 2022-05-22: 40 mg via INTRA_ARTICULAR

## 2022-07-03 MED ORDER — LIDOCAINE HCL 1 % IJ SOLN
3.0000 mL | INTRAMUSCULAR | Status: AC | PRN
Start: 2022-05-22 — End: 2022-05-22
  Administered 2022-05-22: 3 mL

## 2022-07-09 DIAGNOSIS — I130 Hypertensive heart and chronic kidney disease with heart failure and stage 1 through stage 4 chronic kidney disease, or unspecified chronic kidney disease: Secondary | ICD-10-CM | POA: Diagnosis not present

## 2022-07-09 DIAGNOSIS — I5043 Acute on chronic combined systolic (congestive) and diastolic (congestive) heart failure: Secondary | ICD-10-CM | POA: Diagnosis not present

## 2022-07-09 DIAGNOSIS — A419 Sepsis, unspecified organism: Secondary | ICD-10-CM | POA: Diagnosis not present

## 2022-07-09 DIAGNOSIS — E1122 Type 2 diabetes mellitus with diabetic chronic kidney disease: Secondary | ICD-10-CM | POA: Diagnosis not present

## 2022-07-09 DIAGNOSIS — D631 Anemia in chronic kidney disease: Secondary | ICD-10-CM | POA: Diagnosis not present

## 2022-07-09 DIAGNOSIS — J189 Pneumonia, unspecified organism: Secondary | ICD-10-CM | POA: Diagnosis not present

## 2022-07-09 DIAGNOSIS — I2510 Atherosclerotic heart disease of native coronary artery without angina pectoris: Secondary | ICD-10-CM | POA: Diagnosis not present

## 2022-07-09 DIAGNOSIS — N1832 Chronic kidney disease, stage 3b: Secondary | ICD-10-CM | POA: Diagnosis not present

## 2022-07-09 DIAGNOSIS — N179 Acute kidney failure, unspecified: Secondary | ICD-10-CM | POA: Diagnosis not present

## 2022-07-11 ENCOUNTER — Telehealth: Payer: Self-pay | Admitting: Allergy and Immunology

## 2022-07-11 NOTE — Telephone Encounter (Signed)
The last time this medication was refilled was 2022. Called and left a voicemail asking for patient to return call to gather more information before forwarding to provider. Patient has not been seen since last April, but she does have a follow up appointment scheduled for next month with Dr. Lucie Leather.

## 2022-07-11 NOTE — Telephone Encounter (Signed)
Patient requesting a refill for mupirocin, please send to [facility].

## 2022-07-12 ENCOUNTER — Encounter (HOSPITAL_BASED_OUTPATIENT_CLINIC_OR_DEPARTMENT_OTHER): Payer: Self-pay | Admitting: Family

## 2022-07-12 ENCOUNTER — Ambulatory Visit (HOSPITAL_BASED_OUTPATIENT_CLINIC_OR_DEPARTMENT_OTHER): Payer: Medicare PPO | Admitting: Family

## 2022-07-12 VITALS — BP 122/60 | HR 88 | Ht 61.0 in | Wt 132.0 lb

## 2022-07-12 DIAGNOSIS — N1831 Chronic kidney disease, stage 3a: Secondary | ICD-10-CM

## 2022-07-12 DIAGNOSIS — I5020 Unspecified systolic (congestive) heart failure: Secondary | ICD-10-CM

## 2022-07-12 DIAGNOSIS — I5042 Chronic combined systolic (congestive) and diastolic (congestive) heart failure: Secondary | ICD-10-CM

## 2022-07-12 DIAGNOSIS — D509 Iron deficiency anemia, unspecified: Secondary | ICD-10-CM | POA: Diagnosis not present

## 2022-07-12 DIAGNOSIS — I25118 Atherosclerotic heart disease of native coronary artery with other forms of angina pectoris: Secondary | ICD-10-CM

## 2022-07-12 DIAGNOSIS — Z955 Presence of coronary angioplasty implant and graft: Secondary | ICD-10-CM | POA: Diagnosis not present

## 2022-07-12 DIAGNOSIS — E785 Hyperlipidemia, unspecified: Secondary | ICD-10-CM | POA: Diagnosis not present

## 2022-07-12 DIAGNOSIS — E876 Hypokalemia: Secondary | ICD-10-CM | POA: Diagnosis not present

## 2022-07-12 MED ORDER — FUROSEMIDE 40 MG PO TABS: ORAL_TABLET | ORAL | 2 refills | Status: DC

## 2022-07-12 MED ORDER — SACUBITRIL-VALSARTAN 24-26 MG PO TABS: 1.0000 | ORAL_TABLET | Freq: Two times a day (BID) | ORAL | 3 refills | Status: DC

## 2022-07-12 MED ORDER — ISOSORB DINITRATE-HYDRALAZINE 20-37.5 MG PO TABS
1.0000 | ORAL_TABLET | Freq: Two times a day (BID) | ORAL | 3 refills | Status: DC
Start: 2022-07-12 — End: 2023-03-21

## 2022-07-12 MED ORDER — POTASSIUM CHLORIDE CRYS ER 10 MEQ PO TBCR: EXTENDED_RELEASE_TABLET | ORAL | 1 refills | Status: DC

## 2022-07-12 NOTE — Progress Notes (Signed)
Office Visit    Patient Name: Ebony Scott Date of Encounter: 07/12/2022  PCP:  [name], MD   [facility]  Cardiologist:  Jodelle Red, MD  Advanced Practice Provider:  No care team member to display Electrophysiologist:  None      Chief Complaint    Ebony Scott is a 72 y.o. female  presents today for heart failure follow up.   Past Medical History    Past Medical History:  Diagnosis Date   Abscess of dorsum of right hand 09/22/2020   Acute deep vein thrombosis (DVT) of right lower extremity (HCC) 12/13/2017   Allergic rhinitis    Anemia    Angio-edema    Anxiety    pt denies dx   Arthritis    Phreesia 10/21/2019   Asthma    Phreesia 10/21/2019   Breast cancer (HCC) 1998   Left breast, in remission   Bronchiectasis (HCC)    Cataract    REMOVED   CHF (congestive heart failure) (HCC)    Clostridium difficile colitis 01/14/2018   Complication of anesthesia    "had hard time waking up from it several times" (02/20/2012), no anesthesia problems since 2014   Coronary artery disease    Depression    "some; don't take anything for it" (02/20/2012), pt denies dx as of 06/02/21   Diverticulosis    DVT (deep venous thrombosis) (HCC)    Dyspnea    all the time   Fibromyalgia 11/2011   GAVE (gastric antral vascular ectasia)    GERD (gastroesophageal reflux disease)    Graves disease    Headache(784.0)    "related to allergies; more at different times during the year" (02/20/2012)   Hemorrhoids    Hiatal hernia    back and neck   History of blood transfusion 2023   1 unit, 5 units of Iron   Hx of adenomatous colonic polyps 04/12/2016   Hypercholesteremia    good cholesterol is high   Hypothyroidism    IBS (irritable bowel syndrome)    Moderate persistent asthma    -FeV1 72% 2011, -IgE 102 2011, CT sinus Neg 2011   Osteomyelitis of second toe of right foot (HCC) 09/23/2020   Osteoporosis    on reclast yearly   Peripheral  vascular disease (HCC) 2019   DVTs (3) lungs and leg   Pneumonia 04/2011; ~ 11/2011   "double; single" (02/20/2012)   Pneumonia    Septic olecranon bursitis of right elbow 09/22/2020   Seronegative rheumatoid arthritis (HCC)    Dr. Casimer Lanius   SIRS (systemic inflammatory response syndrome) (HCC) 02/10/2018   Sleep apnea    uses cpap nightly   Tracheobronchomalacia    Past Surgical History:  Procedure Laterality Date   ABDOMINAL HYSTERECTOMY N/A    Phreesia 10/21/2019   ANTERIOR AND POSTERIOR REPAIR  1990's   APPENDECTOMY     ARGON LASER APPLICATION  02/05/2021   Procedure: ARGON LASER APPLICATION;  Surgeon: Benancio Deeds, MD;  Location: [facility];  Service: Gastroenterology;;   BREAST LUMPECTOMY  1998   left   BREAST SURGERY N/A    Phreesia 10/21/2019   BRONCHIAL WASHINGS  04/05/2020   Procedure: BRONCHIAL WASHINGS;  Surgeon: Martina Sinner, MD;  Location: [facility];  Service: Pulmonary;;   CAPSULOTOMY Right 08/28/2021   Procedure: CAPSULOTOMY;  Surgeon: Candelaria Stagers, DPM;  Location: [facility];  Service: Podiatry;  Laterality: Right;   CARPOMETACARPEL (CMC) FUSION OF THUMB  WITH AUTOGRAFT FROM RADIUS  ~ 2009   "both thumbs" (02/20/2012)   CATARACT EXTRACTION W/ INTRAOCULAR LENS  IMPLANT, BILATERAL  2012   CERVICAL DISCECTOMY  10/2001   C5-C6   CERVICAL FUSION  2003   C3-C4   CHOLECYSTECTOMY     COLONOSCOPY     CORONARY STENT INTERVENTION N/A 01/11/2021   Procedure: CORONARY STENT INTERVENTION;  Surgeon: Marykay Lex, MD;  Location: [facility] INVASIVE CV LAB;  Service: Cardiovascular;  Laterality: N/A;   DEBRIDEMENT TENNIS ELBOW  ?1970's   right   ESOPHAGOGASTRODUODENOSCOPY     ESOPHAGOGASTRODUODENOSCOPY (EGD) WITH PROPOFOL N/A 02/05/2021   Procedure: ESOPHAGOGASTRODUODENOSCOPY (EGD) WITH PROPOFOL;  Surgeon: Benancio Deeds, MD;  Location: [facility];  Service: Gastroenterology;  Laterality: N/A;   EYE SURGERY N/A    Phreesia 10/21/2019   HAMMER TOE  SURGERY Right 08/28/2021   Procedure: HAMMER TOE REPAIR 2ND TOE RIGHT FOOT;  Surgeon: Candelaria Stagers, DPM;  Location: [facility];  Service: Podiatry;  Laterality: Right;   HYSTERECTOMY     I & D EXTREMITY Right 09/22/2020   Procedure: IRRIGATION AND DEBRIDEMENT RIGHT HAND AND ELBOW;  Surgeon: Teryl Lucy, MD;  Location: [facility];  Service: Orthopedics;  Laterality: Right;   KNEE ARTHROPLASTY  ?1990's   "?right; w/cartilage repair" (02/20/2012)   MASS EXCISION Left 08/28/2021   Procedure: EXCISION OF SOFT TISSUE  MASS LEFT FOOT;  Surgeon: Candelaria Stagers, DPM;  Location: [facility];  Service: Podiatry;  Laterality: Left;   NASAL SEPTUM SURGERY  1980's   POSTERIOR CERVICAL FUSION/FORAMINOTOMY  2004   "failed initial fusion; rewired  anterior neck" (02/20/2012)   REVERSE SHOULDER ARTHROPLASTY Right 02/16/2020   Procedure: REVERSE SHOULDER ARTHROPLASTY;  Surgeon: Teryl Lucy, MD;  Location: [facility];  Service: Orthopedics;  Laterality: Right;   RIGHT/LEFT HEART CATH AND CORONARY ANGIOGRAPHY N/A 01/11/2021   Procedure: RIGHT/LEFT HEART CATH AND CORONARY ANGIOGRAPHY;  Surgeon: Marykay Lex, MD;  Location: [facility] INVASIVE CV LAB;  Service: Cardiovascular;  Laterality: N/A;   SPINE SURGERY N/A    Phreesia 10/21/2019   TONSILLECTOMY  ~ 1953   VESICOVAGINAL FISTULA CLOSURE W/ TAH  1988   VIDEO BRONCHOSCOPY Bilateral 08/23/2016   Procedure: VIDEO BRONCHOSCOPY WITH FLUORO;  Surgeon: Roslynn Amble, MD;  Location: [facility];  Service: Cardiopulmonary;  Laterality: Bilateral;   VIDEO BRONCHOSCOPY N/A 04/05/2020   Procedure: VIDEO BRONCHOSCOPY WITHOUT FLUORO;  Surgeon: Martina Sinner, MD;  Location: [facility];  Service: Pulmonary;  Laterality: N/A;    Allergies  Allergies  Allergen Reactions   Baclofen Anaphylaxis and Other (See Comments)    Altered mental status requiring a 3-day hospital stay- patient became unresponsive   "Put me in a coma for five days", Altered mental status requiring a 3-day  hospital stay- patient became unresponsive   Dust Mite Extract Shortness Of Breath and Other (See Comments)    "sneezing" (02/20/2012) too   Molds & Smuts Shortness Of Breath   Morphine And Codeine Hives and Itching   Other Shortness Of Breath and Other (See Comments)    Grass and weeds "sneezing; filled sinuses" (02/20/2012)   Penicillins Rash and Other (See Comments)    "welts" (02/20/2012)  Tolerated Ancef 02/16/20   Rofecoxib Swelling and Other (See Comments)    Vioxx- feet swelling   Shellfish Allergy Anaphylaxis, Shortness Of Breath, Itching and Rash   Shrimp Extract Anaphylaxis and Other (See Comments)    ALL SHELLFISH   Tetracycline Hcl Nausea And Vomiting  Tetracycline Hcl Other (See Comments)    GI Intolerance   Xolair [Omalizumab] Other (See Comments)    Caused Blood clot   Zoledronic Acid Other (See Comments)    Reclast- Fever, Put in hospital, dr said it was a reaction from a reaction    Fever, inability to walk, Put in hospital  Fever, Put in hospital, dr said it was a reaction from a reaction , Other reaction(s): Unknown   Dilaudid [Hydromorphone Hcl] Itching   Hydrocodone-Acetaminophen Itching   Levofloxacin Other (See Comments)    GI upset  Other Reaction(s): GI Intolerance, Other (See Comments)  REACTION: GI upset, GI upset   Oxycodone Hcl Nausea And Vomiting   Oxycodone Hcl Other (See Comments)    GI Intolerance   Paroxetine Nausea And Vomiting and Other (See Comments)    GI Intolerance also   Celecoxib Swelling and Other (See Comments)    Feet swelling   Diltiazem Hcl Swelling   Lactose Other (See Comments)    Bloating and gas   Lactose Intolerance (Gi) Other (See Comments)    Bloating and gas   Oxycodone-Acetaminophen Itching   Rituximab Other (See Comments)    Anemia    Tree Extract Other (See Comments)    "tested and told I was allergic to it; never experienced a reaction to it" (02/20/2012)   Gamunex [Immune Globulin] Itching and Rash     Tolerates hizentra    Penicillin G Procaine Rash and Other (See Comments)    Welts, also    History of Present Illness    ANARA MARSTON is a 72 y.o. 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   She was hospitalized July 2022 with acute renal failure, acute systolic and diastolic heart failure.  Echo new onset heart failure with LVEF 35-40%, wall motion abnormalities, LV moderately dilated, grade 2 diastolic dysfunction, elevated LVEDP, mild MR, moderate calcification of aortic valve without stenosis. In follow up Farxiga, ACE/ARB/Arni/MRA were on hold due to recent kidney injury.  Cardiac cath was held due to kidney dysfunction. She was started on half tablet of Bidil 20-27.5mg  BID. She was encouraged to take Lasix 40mg  QD as prescribed.  She was started on weekly injections of IVIG by Dr. Lucie Leather for immunodeficiency. PCP later reduced Lasix to 20mg  QD due to renal function. At visit 11/30/20 Jardiance 10mg  QD initiated. Underwent LHC 01/11/21 with DES to LAD. Discharged on Aspirin 81mg  QD x 1 month, Plavix 75mg  QD x 6 mos, and indefinite Xarelto due to hx of PE and DVT. She had moderate pulmonary hypertension and moderate to severely reduced cardiac function . She was recommended for Lasix 40mg  BID x 3 days then 40mg  QD.   Admitted 02/04/21-02/07/21 and required blood transfusion. Aspirin/Plavix/Xarelto held initially and Gi consulted. 02/15/21 EGD showed gastric antral vascular ectasia treated with APC. Plavix resumed. Xarelto was also able to be resumed.   Admitted 3/6-3/16/24 with sepsis and encephalopathy due to CAP treated with abc. Evaluated by GI for acute proctitis. Discharged on Entresto, Bidil, Farxiga, Lasix.   Presents today  for follow up with her daughter. Wearing CPAP regularly with her oxygen at night. Reports no orthopnea, PND. No LE edema. Has not had to taken Lasix in several weeks. Weight at home 126 lbs. Varying only by one pound. Appetite  has been good. She is taking her Entresto, Bidil both once daily - discussed need for BID dosing. Interesting in pursuing shoulder/knee surgery.   EKGs/Labs/Other Studies Reviewed:   The following studies were reviewed today: Cardiac Studies & Procedures   CARDIAC CATHETERIZATION  CARDIAC CATHETERIZATION 01/11/2021  Narrative   Prox LAD lesion is 95% stenosed proximal to 1st Diag &  Mid LAD lesion is 90% stenosed following   Mid LAD lesion is 90% stenosed.   A drug-eluting stent was successfully placed covering both lesions using a STENT ONYX FRONTIER 2.25X34.   Post intervention, there is a 0% residual stenosis.   --------------------------------------------------------------   Hemodynamic findings consistent with moderate pulmonary hypertension.   There is no aortic valve stenosis.  SUMMARY Severe single-vessel CAD with 95% and 90% stenoses of the proximal and mid LAD at major 1st Diag branch.  Likely be culprit for reduced EF and anterior hypokinesis on echo. Successful DES PCI of the LAD using an Onyx Frontier DES 2.25 mm x 34 mm postdilated in tapered fashion from 2.6 to 2.39mm. TIMI III flow pre and post, reduced to 0% Otherwise minimal CAD in the Right dominant system. 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 Moderate to severely reduced cardiac function with Cardiac Output-Index of 4.05-2.59 by Fick and 3.75-2.40 by Thermal Dilution.  RECOMMENDATIONS Continue to titrate medical management of chronic Systolic and diastolic heart failure.  She was given 40 mg IV Lasix on the Cath Lab table.  Recommend that she takes 40 mg  twice daily oral Lasix for 3 days postprocedure. She will need close follow-up with either primary cardiologist or APP to initiate titration of medications. Clinically stable for d/c today    Bryan Lemma, MD  Findings Coronary Findings Diagnostic  Dominance: Right  Left Anterior Descending Prox LAD lesion is 95% stenosed. Vessel is the culprit lesion. The lesion is located proximal to the major branch and concentric. Mid LAD lesion is 90% stenosed. The lesion is distal to major branch and concentric.  First Diagonal Branch Vessel is small in size.  First Septal Branch Vessel is small in size.  Second Diagonal Branch Vessel is small in size.  Left Circumflex The vessel exhibits minimal luminal irregularities.  First Obtuse Marginal Branch The vessel exhibits minimal luminal irregularities.  Right Coronary Artery The vessel exhibits minimal luminal irregularities.  Acute Marginal Branch Vessel is small in size.  Right Ventricular Branch Vessel is small in size.  First Right Posterolateral Branch Vessel is small in size.  Intervention  Prox LAD lesion Stent (Also treats lesions: Mid LAD) Lesion length:  30 mm. CATH VISTA GUIDE 6FR XB3 guide catheter was inserted. Lesion crossed with guidewire using a WIRE ASAHI PROWATER 180CM. Pre-stent angioplasty was performed using a BALLN SAPPHIRE 2.0X15. Maximum pressure:  10 atm. Inflation time: 20 sec. A drug-eluting stent was successfully placed using a STENT ONYX FRONTIER 2.25X34. After initial deployment, the sidebranch wire was removed prior to postdilated with stent balloon Maximum pressure: 16 atm. Inflation time: 30 sec. Stent strut is well apposed. Post-stent angioplasty was performed using a BALLN SAPPHIRE [place] 2.5X15. Maximum pressure:  18 atm. Inflation time:  20 sec. Proximal 30 mm of the stent: Tapered stent from 2.65-2.4 BMW wire was used to wire the diagonal branch for marking.  Was not used for  intervention. Post-Intervention Lesion Assessment The intervention was successful. Pre-interventional TIMI flow  is 3. Post-intervention TIMI flow is 3. Treated lesion length:  34 mm. No complications occurred at this lesion. There is a 0% residual stenosis post intervention.  Mid LAD lesion Stent (Also treats lesions: Prox LAD) See details in Prox LAD lesion. Post-Intervention Lesion Assessment The intervention was successful. Pre-interventional TIMI flow is 3. Post-intervention TIMI flow is 3. There is a 0% residual stenosis post intervention.     ECHOCARDIOGRAM  ECHOCARDIOGRAM COMPLETE 05/25/2021  Narrative ECHOCARDIOGRAM REPORT    Patient Name:   JANYA SURTI  Date of Exam: 05/25/2021 Medical Rec #:  [id number]     Height:       61.0 in Accession #:    [id number]    Weight:       131.6 lb Date of Birth:  06/14/1950      BSA:          1.581 m Patient Age:    71 years      BP:           131/59 mmHg Patient Gender: F             HR:           70 bpm. Exam Location:  [address]  Procedure: 2D Echo, 3D Echo, Cardiac Doppler and Color Doppler  Indications:    Congestive Heart Failure I50.9  History:        Patient has prior history of Echocardiogram examinations, most recent 09/16/2020.  Sonographer:    Thurman Coyer RDCS Referring Phys: Alver Sorrow  IMPRESSIONS   1. Septal and apical akinesis distal anterior wall and distal inferior wall hypokinesis EF similar to TTE done 09/16/20. Left ventricular ejection fraction, by estimation, is 30 to 35%. The left ventricle has moderately decreased function. The left ventricle has no regional wall motion abnormalities. The left ventricular internal cavity size was moderately dilated. Left ventricular diastolic parameters are consistent with Grade I diastolic dysfunction (impaired relaxation). Elevated left ventricular end-diastolic pressure. 2. Right ventricular systolic function is normal. The right ventricular size is  normal. 3. The mitral valve is normal in structure. Mild mitral valve regurgitation. No evidence of mitral stenosis. 4. Calcified non coronary cusp . The aortic valve is tricuspid. There is mild calcification of the aortic valve. Aortic valve regurgitation is not visualized. Aortic valve sclerosis is present, with no evidence of aortic valve stenosis. 5. The inferior vena cava is normal in size with greater than 50% respiratory variability, suggesting right atrial pressure of 3 mmHg.  FINDINGS Left Ventricle: Septal and apical akinesis distal anterior wall and distal inferior wall hypokinesis EF similar to TTE done 09/16/20. Left ventricular ejection fraction, by estimation, is 30 to 35%. The left ventricle has moderately decreased function. The left ventricle has no regional wall motion abnormalities. The left ventricular internal cavity size was moderately dilated. There is no left ventricular hypertrophy. Left ventricular diastolic parameters are consistent with Grade I diastolic dysfunction (impaired relaxation). Elevated left ventricular end-diastolic pressure.  Right Ventricle: The right ventricular size is normal. No increase in right ventricular wall thickness. Right ventricular systolic function is normal.  Left Atrium: Left atrial size was normal in size.  Right Atrium: Right atrial size was normal in size.  Pericardium: There is no evidence of pericardial effusion.  Mitral Valve: The mitral valve is normal in structure. There is mild thickening of the mitral valve leaflet(s). Mild mitral valve regurgitation. No evidence of mitral valve stenosis.  Tricuspid Valve: The tricuspid valve is normal in  structure. Tricuspid valve regurgitation is not demonstrated. No evidence of tricuspid stenosis.  Aortic Valve: Calcified non coronary cusp. The aortic valve is tricuspid. There is mild calcification of the aortic valve. Aortic valve regurgitation is not visualized. Aortic valve sclerosis is  present, with no evidence of aortic valve stenosis.  Pulmonic Valve: The pulmonic valve was normal in structure. Pulmonic valve regurgitation is not visualized. No evidence of pulmonic stenosis.  Aorta: The aortic root is normal in size and structure.  Venous: The inferior vena cava is normal in size with greater than 50% respiratory variability, suggesting right atrial pressure of 3 mmHg.  IAS/Shunts: No atrial level shunt detected by color flow Doppler.   LEFT VENTRICLE PLAX 2D LVIDd:         5.00 cm      Diastology LVIDs:         3.90 cm      LV e' medial:    3.83 cm/s LV PW:         1.05 cm      LV E/e' medial:  23.9 LV IVS:        1.05 cm      LV e' lateral:   5.48 cm/s LVOT diam:     1.80 cm      LV E/e' lateral: 16.7 LV SV:         58 LV SV Index:   37 LVOT Area:     2.54 cm  3D Volume EF: LV Volumes (MOD)            3D EF:        40 % LV vol d, MOD A2C: 148.0 ml LV EDV:       198 ml LV vol d, MOD A4C: 126.0 ml LV ESV:       119 ml LV vol s, MOD A2C: 80.2 ml  LV SV:        79 ml LV vol s, MOD A4C: 80.2 ml LV SV MOD A2C:     67.8 ml LV SV MOD A4C:     126.0 ml LV SV MOD BP:      58.9 ml  RIGHT VENTRICLE RV Basal diam:  2.60 cm RV S prime:     11.90 cm/s TAPSE (M-mode): 1.7 cm  LEFT ATRIUM             Index        RIGHT ATRIUM          Index LA Vol (A2C):   50.7 ml 32.07 ml/m  RA Area:     9.68 cm LA Vol (A4C):   55.9 ml 35.36 ml/m  RA Volume:   17.70 ml 11.20 ml/m LA Biplane Vol: 54.2 ml 34.28 ml/m AORTIC VALVE LVOT Vmax:   102.00 cm/s LVOT Vmean:  68.100 cm/s LVOT VTI:    0.227 m  AORTA Ao Root diam: 2.50 cm  MITRAL VALVE MV Area (PHT): 3.65 cm     SHUNTS MV Decel Time: 208 msec     Systemic VTI:  0.23 m MV E velocity: 91.40 cm/s   Systemic Diam: 1.80 cm MV A velocity: 132.00 cm/s MV E/A ratio:  0.69  Charlton Haws MD Electronically signed by Charlton Haws MD Signature Date/Time: 05/25/2021/11:53:00 AM    Final             EKG:  No EKG today.    Recent Labs: 04/25/2022: B Natriuretic Peptide 1,049.7 05/02/2022: Magnesium 2.2 06/14/2022: ALT 29; BUN 19; Creatinine  1.14; Hemoglobin 10.1; Platelet Count 224; Potassium 4.3; Sodium 142  Recent Lipid Panel    Component Value Date/Time   CHOL 162 12/21/2020 0826   TRIG 210 (H) 12/21/2020 0826   HDL 64 12/21/2020 0826   CHOLHDL 2.5 12/21/2020 0826   CHOLHDL 8.4 09/23/2020 1253   VLDL UNABLE TO CALCULATE IF TRIGLYCERIDE OVER 400 mg/dL 40/98/1191 4782   LDLCALC 64 12/21/2020 0826   LDLCALC 77 07/04/2018 1017   LDLDIRECT 141.2 (H) 09/23/2020 1253    Home Medications   Current Meds  Medication Sig   acetaminophen (TYLENOL) 650 MG CR tablet Take 1,300 mg by mouth daily as needed for pain.   acetic acid-hydrocortisone (VOSOL-HC) OTIC solution Place 4 drops into both ears See admin instructions. Instill 4 drops into both ears the first 5 days of the month, then as directed/as needed for itching   albuterol (VENTOLIN HFA) 108 (90 Base) MCG/ACT inhaler Inhale 2 puffs into the lungs every 6 (six) hours as needed for wheezing or shortness of breath.   allopurinol (ZYLOPRIM) 100 MG tablet TAKE 1 TABLET BY MOUTH DAILY (Patient taking differently: Take 100 mg by mouth daily with supper.)   Alpha-D-Galactosidase (BEANO PO) Take 2 tablets by mouth 3 (three) times daily before meals.   Alpha-Lipoic Acid 600 MG TABS Take 600 mg by mouth daily.   ammonium lactate (AMLACTIN DAILY) 12 % lotion Apply 1 Application topically as needed for dry skin.   atorvastatin (LIPITOR) 80 MG tablet TAKE 1 TABLET BY MOUTH DAILY (Patient taking differently: Take 80 mg by mouth daily with supper.)   azelastine (ASTELIN) 0.1 % nasal spray Place 2 sprays into both nostrils daily. Use in each nostril as directed   benzonatate (TESSALON) 200 MG capsule TAKE 1 CAPSULE BY MOUTH THREE TIMES DAILY AS NEEDED FOR COUGH (Patient taking differently: Take 200 mg by mouth 2 (two) times daily as needed for cough.)    Calcium-Magnesium-Vitamin D (CALCIUM MAGNESIUM PO) Take 1 tablet by mouth at bedtime.   cetirizine (ZYRTEC) 10 MG tablet Take 1 tablet (10 mg total) by mouth 2 (two) times daily as needed for allergies (Can take a n extra dose during flare ups.). (Patient taking differently: Take 10 mg by mouth at bedtime.)   Cholecalciferol (VITAMIN D3) 25 MCG (1000 UT) capsule Take 1,000 Units by mouth at bedtime.   clopidogrel (PLAVIX) 75 MG tablet Take 1 tablet (75 mg total) by mouth daily.   cyproheptadine (PERIACTIN) 4 MG tablet Take 2 tablets (8 mg total) by mouth at bedtime. Take 2 tablets at bedtime as needed to treat facial pain or sleep dysfunction. (Patient taking differently: Take 8 mg by mouth at bedtime.)   denosumab (PROLIA) 60 MG/ML SOSY injection Inject 60 mg into the skin every 6 (six) months.   dexlansoprazole (DEXILANT) 60 MG capsule Take 1 capsule (60 mg total) by mouth daily.   empagliflozin (JARDIANCE) 10 MG TABS tablet Take 1 tablet (10 mg total) by mouth daily before breakfast.   EPINEPHrine 0.3 mg/0.3 mL IJ SOAJ injection USE AS DIRECTED BY YOUR PHYSICIAN INTRAMUSCULARLY AS NEEDED FOR ANAPHYLAXIS (Patient taking differently: Inject 0.3 mg into the muscle as needed for anaphylaxis.)   famotidine (PEPCID) 20 MG tablet TAKE 1 TABLET BY MOUTH EACH NIGHT AT BEDTIME (Patient taking differently: Take 20 mg by mouth at bedtime.)   fluticasone (FLONASE) 50 MCG/ACT nasal spray USE 2 SPRAYS IN EACH NOSTRIL DAILY (Patient taking differently: Place 2 sprays into both nostrils daily.)   folic acid (FOLVITE)  1 MG tablet Take 1 tablet (1 mg total) by mouth daily.   gabapentin (NEURONTIN) 300 MG capsule Take 1 capsule (300 mg total) by mouth at bedtime.   GOODSENSE ARTHRITIS PAIN 1 % GEL Apply 1 Application topically 4 times daily as needed for pain.   guaiFENesin (MUCINEX) 600 MG 12 hr tablet Take 1 tablet (600 mg total) by mouth 2 (two) times daily as needed for cough or to loosen phlegm. (Patient taking  differently: Take 600 mg by mouth 2 (two) times daily.)   HIZENTRA 1 GM/5ML SOLN 13 g by Subcutaneous Infusion route See admin instructions. On hold while at rehab. Hizentra- 13 g infused every Friday  ("3 times/5 ml = 65 ml total once weekly")   ipratropium-albuterol (DUONEB) 0.5-2.5 (3) MG/3ML SOLN Take 3 mLs by nebulization every 6 (six) hours as needed.   isosorbide-hydrALAZINE (BIDIL) 20-37.5 MG tablet Take 1 tablet by mouth 2 (two) times daily. (Patient taking differently: Take 1 tablet by mouth daily with supper.)   Lactase (LACTOSE FAST ACTING RELIEF PO) Take 3 tablets by mouth 3 (three) times daily before meals.   lidocaine-prilocaine (EMLA) cream Apply 1 application  topically as needed (pain).   magnesium oxide (MAG-OX) 400 (240 Mg) MG tablet Take 400 mg by mouth at bedtime.   Menthol, Topical Analgesic, (BIOFREEZE) 4 % GEL Apply 1 application  topically daily as needed (for shoulder or joint pain).   mirtazapine (REMERON SOL-TAB) 30 MG disintegrating tablet Take 1 tablet (30 mg total) by mouth at bedtime as needed (sleep). (Patient taking differently: Take 30 mg by mouth at bedtime.)   montelukast (SINGULAIR) 10 MG tablet Take 1 tablet (10 mg total) by mouth daily. (Patient taking differently: Take 10 mg by mouth daily after supper.)   Nystatin (GERHARDT'S BUTT CREAM) CREA Apply topically 3 times a day as needed for itching or irritation   nystatin (MYCOSTATIN) 100000 UNIT/ML suspension Use as directed 15 mLs in the mouth or throat at bedtime.   Prenatal Vit-Fe Fumarate-FA (PRENATAL VITAMINS PO) Take 1 tablet by mouth daily.   Probiotic Product (ALIGN PO) Take 1 capsule by mouth daily.   Propylene Glycol (SYSTANE COMPLETE) 0.6 % SOLN Place 1 drop into both eyes 2 (two) times daily.   sacubitril-valsartan (ENTRESTO) 24-26 MG Take 1 tablet by mouth 2 (two) times daily.   Tiotropium Bromide-Olodaterol (STIOLTO RESPIMAT) 2.5-2.5 MCG/ACT AERS Inhale 2 puffs into the lungs daily. (Patient  taking differently: Inhale 2 puffs into the lungs at bedtime.)   traMADol (ULTRAM) 50 MG tablet Take 1 tablet (50 mg total) by mouth at bedtime.   Triamcinolone Acetonide (TRIAMCINOLONE 0.1 % CREAM : EUCERIN) CREA Apply 1 Application topically 2 (two) times daily.   triamcinolone ointment (KENALOG) 0.5 % Apply 1 Application topically 2 (two) times daily.   XARELTO 20 MG TABS tablet TAKE 1 TABLET BY MOUTH DAILY WITH SUPPER (Patient taking differently: Take 20 mg by mouth daily with supper.)     Review of Systems      All other systems reviewed and are otherwise negative except as noted above.  Physical Exam    VS:  BP 122/60   Pulse 88   Ht 5\' 1"  (1.549 m)   Wt 132 lb (59.9 kg)   BMI 24.94 kg/m  , BMI Body mass index is 24.94 kg/m.  Wt Readings from Last 3 Encounters:  07/12/22 132 lb (59.9 kg)  06/14/22 132 lb 6.4 oz (60.1 kg)  05/29/22 127 lb (57.6 kg)  GEN: Well nourished, well developed, in no acute distress. HEENT: normal. Neck: Supple, no JVD, carotid bruits, or masses. Cardiac: RRR, no murmurs, rubs, or gallops. No clubbing, cyanosis, edema.  Radials/PT 2+ and equal bilaterally.  Respiratory:  Respirations regular and unlabored, clear to auscultation bilaterally. GI: Soft, nontender, nondistended. MS: No deformity or atrophy. Skin: Warm and dry, no rash.  Neuro:  Strength and sensation are intact. Psych: Normal affect.  Assessment & Plan    CAD - 01/11/21 s/p DES PCI of LAD.  GDMT includes plavix, atorvastatin. Heart healthy diet and regular cardiovascular exercise encouraged.    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 06/14/22 wbc 10.1.    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 [facility] 01/11/22 DES-RCA.  Echo 05/2021 LVF 30-35%.   Update echo. If LVEF <35% refer to EP for consideration of ICD10.  Daily weight, low salt diet, fluid restriction <2 L encouraged.  GDMT includes Jardiance 10mg  QD, Lasix 40mg  PRN, Entresto 24-26mg  BID, Bidil 20-37.5mg  BID. She is taking Bidil  and Entresto only QD, reiterated need to take BID.  DM2 - Continue to follow with PCP.   ZOX0R - Careful titration of diuretic and antihypertensive.    LDL goal <70 -Continue Atorvastatin 80mg  QD. Due for lipid panel at follow up  Hypokalemia - Continue potassium supplement.    Disposition: Follow up in 3 months with Jodelle Red, MD or APP.  Signed, Alver Sorrow, NP 07/12/2022, 3:33 PM [facility]

## 2022-07-12 NOTE — Patient Instructions (Addendum)
Medication Instructions:  Your physician recommends that you continue on your current medications as directed. Please refer to the Current Medication list given to you today.  ENSURE YOU ARE TAKING ENTRESTO AND BIDIL TWICE DAILY  *If you need a refill on your cardiac medications before your next appointment, please call your pharmacy*   Testing/Procedures: Your physician has requested that you have an echocardiogram. Echocardiography is a painless test that uses sound waves to create images of your heart. It provides your doctor with information about the size and shape of your heart and how well your heart's chambers and valves are working. This procedure takes approximately one hour. There are no restrictions for this procedure. Please do NOT wear cologne, perfume, aftershave, or lotions (deodorant is allowed). Please arrive 15 minutes prior to your appointment time.  Follow-Up: At [facility], you and your health needs are our priority.  As part of our continuing mission to provide you with exceptional heart care, we have created designated Provider Care Teams.  These Care Teams include your primary Cardiologist (physician) and Advanced Practice Providers (APPs -  Physician Assistants and Nurse Practitioners) who all work together to provide you with the care you need, when you need it.  We recommend signing up for the patient portal called "MyChart".  Sign up information is provided on this After Visit Summary.  MyChart is used to connect with patients for Virtual Visits (Telemedicine).  Patients are able to view lab/test results, encounter notes, upcoming appointments, etc.  Non-urgent messages can be sent to your provider as well.   To learn more about what you can do with MyChart, go to [email or web address].    Your next appointment:   4-6 week(s)  Provider:   Jodelle Red, MD or Gillian Shields, NP     OTHER INSTRUCTIONS:  Recommend weighing daily and keeping a  log. Please call our office if you have weight gain of 2 pounds overnight or 5 pounds in 1 week.   Date  Time Weight

## 2022-07-12 NOTE — Telephone Encounter (Signed)
Patient is returning call. Would like a call back, [phone]

## 2022-07-12 NOTE — Telephone Encounter (Signed)
Returned patient's call - DOB verified - advised last office visit was on 06/13/21 w/ Dr. Lucie Leather,  Schedule for a sooner appt.: 07/24/22 @ 11:20 am w/Anne.  Patient verbalized understanding, no further questions.

## 2022-07-13 ENCOUNTER — Encounter (HOSPITAL_BASED_OUTPATIENT_CLINIC_OR_DEPARTMENT_OTHER): Payer: Self-pay | Admitting: Family

## 2022-07-13 DIAGNOSIS — J189 Pneumonia, unspecified organism: Secondary | ICD-10-CM | POA: Diagnosis not present

## 2022-07-13 DIAGNOSIS — I2510 Atherosclerotic heart disease of native coronary artery without angina pectoris: Secondary | ICD-10-CM | POA: Diagnosis not present

## 2022-07-13 DIAGNOSIS — D631 Anemia in chronic kidney disease: Secondary | ICD-10-CM | POA: Diagnosis not present

## 2022-07-13 DIAGNOSIS — N179 Acute kidney failure, unspecified: Secondary | ICD-10-CM | POA: Diagnosis not present

## 2022-07-13 DIAGNOSIS — N1832 Chronic kidney disease, stage 3b: Secondary | ICD-10-CM | POA: Diagnosis not present

## 2022-07-13 DIAGNOSIS — E1122 Type 2 diabetes mellitus with diabetic chronic kidney disease: Secondary | ICD-10-CM | POA: Diagnosis not present

## 2022-07-13 DIAGNOSIS — I5043 Acute on chronic combined systolic (congestive) and diastolic (congestive) heart failure: Secondary | ICD-10-CM | POA: Diagnosis not present

## 2022-07-13 DIAGNOSIS — I130 Hypertensive heart and chronic kidney disease with heart failure and stage 1 through stage 4 chronic kidney disease, or unspecified chronic kidney disease: Secondary | ICD-10-CM | POA: Diagnosis not present

## 2022-07-13 DIAGNOSIS — A419 Sepsis, unspecified organism: Secondary | ICD-10-CM | POA: Diagnosis not present

## 2022-07-14 ENCOUNTER — Other Ambulatory Visit (HOSPITAL_BASED_OUTPATIENT_CLINIC_OR_DEPARTMENT_OTHER): Payer: Self-pay | Admitting: Family

## 2022-07-14 DIAGNOSIS — J4550 Severe persistent asthma, uncomplicated: Secondary | ICD-10-CM

## 2022-07-14 DIAGNOSIS — I5020 Unspecified systolic (congestive) heart failure: Secondary | ICD-10-CM

## 2022-07-15 DIAGNOSIS — J45909 Unspecified asthma, uncomplicated: Secondary | ICD-10-CM | POA: Diagnosis not present

## 2022-07-15 DIAGNOSIS — M0600 Rheumatoid arthritis without rheumatoid factor, unspecified site: Secondary | ICD-10-CM | POA: Diagnosis not present

## 2022-07-15 DIAGNOSIS — I5032 Chronic diastolic (congestive) heart failure: Secondary | ICD-10-CM | POA: Diagnosis not present

## 2022-07-15 DIAGNOSIS — R0602 Shortness of breath: Secondary | ICD-10-CM | POA: Diagnosis not present

## 2022-07-15 DIAGNOSIS — G4736 Sleep related hypoventilation in conditions classified elsewhere: Secondary | ICD-10-CM | POA: Diagnosis not present

## 2022-07-15 DIAGNOSIS — G894 Chronic pain syndrome: Secondary | ICD-10-CM | POA: Diagnosis not present

## 2022-07-15 DIAGNOSIS — J471 Bronchiectasis with (acute) exacerbation: Secondary | ICD-10-CM | POA: Diagnosis not present

## 2022-07-15 DIAGNOSIS — G4733 Obstructive sleep apnea (adult) (pediatric): Secondary | ICD-10-CM | POA: Diagnosis not present

## 2022-07-15 DIAGNOSIS — J4540 Moderate persistent asthma, uncomplicated: Secondary | ICD-10-CM | POA: Diagnosis not present

## 2022-07-17 ENCOUNTER — Ambulatory Visit (HOSPITAL_COMMUNITY): Payer: Medicare PPO | Attending: Family

## 2022-07-17 DIAGNOSIS — I5020 Unspecified systolic (congestive) heart failure: Secondary | ICD-10-CM | POA: Diagnosis not present

## 2022-07-17 LAB — ECHOCARDIOGRAM COMPLETE
AR max vel: 1.47 cm2
AV Area VTI: 1.43 cm2
AV Area mean vel: 1.44 cm2
AV Mean grad: 10 mmHg
AV Peak grad: 17.1 mmHg
Ao pk vel: 2.07 m/s
Area-P 1/2: 4.06 cm2
S' Lateral: 3.65 cm

## 2022-07-17 MED ORDER — PERFLUTREN LIPID MICROSPHERE
1.0000 mL | INTRAVENOUS | Status: AC | PRN
Start: 2022-07-17 — End: 2022-07-17
  Administered 2022-07-17: 2 mL via INTRAVENOUS

## 2022-07-18 ENCOUNTER — Telehealth: Payer: Self-pay | Admitting: Pharmacist

## 2022-07-18 ENCOUNTER — Telehealth (HOSPITAL_BASED_OUTPATIENT_CLINIC_OR_DEPARTMENT_OTHER): Payer: Self-pay

## 2022-07-18 ENCOUNTER — Telehealth (HOSPITAL_BASED_OUTPATIENT_CLINIC_OR_DEPARTMENT_OTHER): Payer: Self-pay | Admitting: Cardiology

## 2022-07-18 ENCOUNTER — Other Ambulatory Visit: Payer: Self-pay

## 2022-07-18 DIAGNOSIS — I5020 Unspecified systolic (congestive) heart failure: Secondary | ICD-10-CM

## 2022-07-18 MED ORDER — SACUBITRIL-VALSARTAN 24-26 MG PO TABS: 1.0000 | ORAL_TABLET | Freq: Two times a day (BID) | ORAL | 1 refills | Status: DC

## 2022-07-18 NOTE — Telephone Encounter (Addendum)
Results called to patient who verbalizes understanding! Referral placed, patient aware they will call to get her scheduled.     ----- Message from Alver Sorrow, NP sent at 07/18/2022  7:56 AM EDT ----- Echocardiogram shows reduced heart muscle function 30-35% which is stable from previous.  Heart muscle mildly stiff and thick.  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  Given persistently reduced LVEF recommend referral to EP for consideration of ICD.

## 2022-07-18 NOTE — Progress Notes (Signed)
Care Management & Coordination Services Pharmacy Team   Reason for Encounter: Appointment Reminder   Contacted patient to confirm telephone appointment with Erskine Emery, PharmD on 07/19/22 at 2:30PM. Unsuccessful outreach. Left voicemail for patient to return call.    Care Gaps: Annual wellness visit in last year? No last done 07/13/21  If Diabetic:  Last eye exam / retinopathy screening: due 03/08/23 Last diabetic foot exam: due 10/12/22   Future Appointments  Date Time Provider Department Center  07/19/2022  2:00 PM Erroll Luna, [facility] None  07/19/2022  3:00 PM [facility]-ANNUAL WELLNESS VISIT [facility]  07/20/2022  2:30 PM Martina Sinner, MD [facility] None  07/24/2022 11:20 AM [facility], Norvel Richards, FNP [facility] None  08/07/2022  1:55 PM Alver Sorrow, NP [facility]  09/10/2022 11:00 AM Jodelle Red, MD [facility]  09/10/2022  3:20 PM Dimple Casey, [name], MD [facility] None  10/01/2022 10:00 AM [facility]BSUMMIT LAB [facility]    [employer]

## 2022-07-18 NOTE — Telephone Encounter (Signed)
Rx request sent to pharmacy.  

## 2022-07-18 NOTE — Telephone Encounter (Signed)
*  STAT* If patient is at the pharmacy, call can be transferred to refill team.   1. Which medications need to be refilled? (please list name of each medication and dose if known)   sacubitril-valsartan (ENTRESTO) 24-26 MG   2. Which pharmacy/location (including street and city if local pharmacy) is medication to be sent to?  [facility] - [place], [place] - [address]   3. Do they need a 30 day or 90 day supply?   90 day  Daughter stated patient's pharmacy has not received refill as yet and requests refill be re-sent.  Daughter stated patient is almost out of this medication.

## 2022-07-19 ENCOUNTER — Ambulatory Visit: Payer: Medicare PPO | Admitting: Pharmacist

## 2022-07-19 NOTE — Progress Notes (Signed)
Care Management & Coordination Services Pharmacy Note  07/19/2022 Name:  Ebony Scott MRN:  [id number] DOB:  Jun 12, 1950  Summary: PharmD FU for HF.  Denies swelling/SOB.  Entresto and Bidil now taking twice daily.  NO immediate concerns at this time  Recommendations/Changes made from today's visit: No changes  Follow up plan: FU 4 months   Subjective: Ebony Scott is an 72 y.o. year old female who is a primary patient of [name], [name], MD.  The care coordination team was consulted for assistance with disease management and care coordination needs.    Engaged with patient by telephone for follow up visit.  Chart Updates: Recent office visits:  05/29/2022 OV (PCP) [name], MD; I recommended that she stop valsartan since she is now on Entresto. I recommended that she hold the Lasix along with potassium and only use the Lasix as needed for swelling to avoid dehydration    Recent consult visits:  05/22/2022 OV (Rheumatology) Fuller Plan, MD; no medication changes indicated.   06/14/2022 OV (Oncology) Jaci Standard, MD;    Hospital visits:  04/25/2022 ED to Hospital Admission due to Fever Admit date: 04/25/2022 Discharge date:  05/05/2022   Sepsis due to CAP, POA - resolved  Acute respiratory failure with hypoxia - resolved  Acute proctitis - resolved      Objective:  Lab Results  Component Value Date   CREATININE 1.14 (H) 06/14/2022   BUN 19 06/14/2022   GFR 38.39 (L) 05/03/2021   EGFR 40 (L) 05/29/2022   GFRNONAA 51 (L) 06/14/2022   GFRAA 40 (L) 08/25/2020   NA 142 06/14/2022   K 4.3 06/14/2022   CALCIUM 9.4 06/14/2022   CO2 24 06/14/2022   GLUCOSE 136 (H) 06/14/2022    Lab Results  Component Value Date/Time   HGBA1C 6.3 (H) 05/29/2022 12:28 PM   HGBA1C 6.3 (H) 08/14/2021 11:56 AM   GFR 38.39 (L) 05/03/2021 10:27 AM   GFR 35.53 (L) 04/29/2020 11:58 AM    Last diabetic Eye exam:  Lab Results  Component Value Date/Time   HMDIABEYEEXA No  Retinopathy 03/07/2022 12:00 AM    Last diabetic Foot exam: No results found for: "HMDIABFOOTEX"   Lab Results  Component Value Date   CHOL 162 12/21/2020   HDL 64 12/21/2020   LDLCALC 64 12/21/2020   LDLDIRECT 141.2 (H) 09/23/2020   TRIG 210 (H) 12/21/2020   CHOLHDL 2.5 12/21/2020       Latest Ref Rng & Units 06/14/2022   10:12 AM 05/29/2022   12:28 PM 05/01/2022    5:40 AM  Hepatic Function  Total Protein 6.5 - 8.1 g/dL 6.5  6.1  6.5   Albumin 3.5 - 5.0 g/dL 3.7   2.5   AST 15 - 41 U/L 31  53  34   ALT 0 - 44 U/L 29  59  21   Alk Phosphatase 38 - 126 U/L 60   46   Total Bilirubin 0.3 - 1.2 mg/dL 0.6  0.5  0.4     Lab Results  Component Value Date/Time   TSH 1.161 05/24/2021 02:59 PM   TSH 0.983 09/23/2020 12:53 PM   TSH 1.50 03/27/2018 10:16 AM   TSH 18.10 (H) 12/02/2017 08:09 AM   FREET4 1.08 03/06/2018 04:27 PM   FREET4 1.02 02/21/2012 04:26 PM       Latest Ref Rng & Units 06/14/2022   10:12 AM 05/29/2022   12:28 PM 05/02/2022    5:42  AM  CBC  WBC 4.0 - 10.5 K/uL 10.3  13.7  11.7   Hemoglobin 12.0 - 15.0 g/dL 60.4  54.0  98.1   Hematocrit 36.0 - 46.0 % 30.4  30.2  31.0   Platelets 150 - 400 K/uL 224  216  215     Lab Results  Component Value Date/Time   VD25OH 55 07/30/2019 10:06 AM   VD25OH 53 05/07/2014 10:11 AM   VITAMINB12 549 09/19/2021 12:15 PM   VITAMINB12 1,486 (H) 09/26/2020 08:48 AM    Clinical ASCVD: No  The 10-year ASCVD risk score (Arnett DK, et al., 2019) is: 30.2%   Values used to calculate the score:     Age: 84 years     Sex: Female     Is Non-Hispanic African American: No     Diabetic: Yes     Tobacco smoker: No     Systolic Blood Pressure: 122 mmHg     Is BP treated: Yes     HDL Cholesterol: 64 mg/dL     Total Cholesterol: 162 mg/dL        1/91/4782    9:56 PM 08/05/2020    3:09 PM 07/07/2020   10:55 AM  Depression screen PHQ 2/9  Decreased Interest 0 0 0  Down, Depressed, Hopeless 0 0 0  PHQ - 2 Score 0 0 0     Social  History   Tobacco Use  Smoking Status Never   Passive exposure: Past  Smokeless Tobacco Never  Tobacco Comments   Parents   BP Readings from Last 3 Encounters:  07/12/22 122/60  06/14/22 (!) 129/45  05/29/22 (!) 102/56   Pulse Readings from Last 3 Encounters:  07/12/22 88  06/14/22 75  05/29/22 76   Wt Readings from Last 3 Encounters:  07/12/22 132 lb (59.9 kg)  06/14/22 132 lb 6.4 oz (60.1 kg)  05/29/22 127 lb (57.6 kg)   BMI Readings from Last 3 Encounters:  07/12/22 24.94 kg/m  06/14/22 25.02 kg/m  05/29/22 24.00 kg/m    Allergies  Allergen Reactions   Baclofen Anaphylaxis and Other (See Comments)    Altered mental status requiring a 3-day hospital stay- patient became unresponsive   "Put me in a coma for five days", Altered mental status requiring a 3-day hospital stay- patient became unresponsive   Dust Mite Extract Shortness Of Breath and Other (See Comments)    "sneezing" (02/20/2012) too   Molds & Smuts Shortness Of Breath   Morphine And Codeine Hives and Itching   Other Shortness Of Breath and Other (See Comments)    Grass and weeds "sneezing; filled sinuses" (02/20/2012)   Penicillins Rash and Other (See Comments)    "welts" (02/20/2012)  Tolerated Ancef 02/16/20   Rofecoxib Swelling and Other (See Comments)    Vioxx- feet swelling   Shellfish Allergy Anaphylaxis, Shortness Of Breath, Itching and Rash   Shrimp Extract Anaphylaxis and Other (See Comments)    ALL SHELLFISH   Tetracycline Hcl Nausea And Vomiting   Tetracycline Hcl Other (See Comments)    GI Intolerance   Xolair [Omalizumab] Other (See Comments)    Caused Blood clot   Zoledronic Acid Other (See Comments)    Reclast- Fever, Put in hospital, dr said it was a reaction from a reaction    Fever, inability to walk, Put in hospital  Fever, Put in hospital, dr said it was a reaction from a reaction , Other reaction(s): Unknown   Dilaudid [Hydromorphone Hcl] Itching  Hydrocodone-Acetaminophen Itching   Levofloxacin Other (See Comments)    GI upset  Other Reaction(s): GI Intolerance, Other (See Comments)  REACTION: GI upset, GI upset   Oxycodone Hcl Nausea And Vomiting   Oxycodone Hcl Other (See Comments)    GI Intolerance   Paroxetine Nausea And Vomiting and Other (See Comments)    GI Intolerance also   Celecoxib Swelling and Other (See Comments)    Feet swelling   Diltiazem Hcl Swelling   Lactose Other (See Comments)    Bloating and gas   Lactose Intolerance (Gi) Other (See Comments)    Bloating and gas   Oxycodone-Acetaminophen Itching   Rituximab Other (See Comments)    Anemia    Tree Extract Other (See Comments)    "tested and told I was allergic to it; never experienced a reaction to it" (02/20/2012)   Gamunex [Immune Globulin] Itching and Rash    Tolerates hizentra    Penicillin G Procaine Rash and Other (See Comments)    Welts, also    Medications Reviewed Today     Reviewed by Erroll Luna, [facility] (Pharmacist) on 07/19/22 at 1438  Med List Status: <None>   Medication Order Taking? Sig Documenting Provider Last Dose Status Informant  acetaminophen (TYLENOL) 650 MG CR tablet [id number] No Take 1,300 mg by mouth daily as needed for pain. [provider] Taking Active Family Member  acetic acid-hydrocortisone (VOSOL-HC) OTIC solution [id number] No Place 4 drops into both ears See admin instructions. Instill 4 drops into both ears the first 5 days of the month, then as directed/as needed for itching [provider] Taking Active Family Member  albuterol (VENTOLIN HFA) 108 (90 Base) MCG/ACT inhaler [id number] No Inhale 2 puffs into the lungs every 6 (six) hours as needed for wheezing or shortness of breath. Kozlow, [name], MD Taking Active Family Member  allopurinol (ZYLOPRIM) 100 MG tablet [id number] No TAKE 1 TABLET BY MOUTH DAILY  Patient taking differently: Take 100 mg by mouth daily with supper.   [name],  MD Taking Active Family Member  Alpha-D-Galactosidase [facility] PO) [id number] No Take 2 tablets by mouth 3 (three) times daily before meals. [provider] Taking Active Family Member  Alpha-Lipoic Acid 600 MG TABS [id number] No Take 600 mg by mouth daily. [provider] Taking Active Family Member  ammonium lactate (AMLACTIN DAILY) 12 % lotion [id number] No Apply 1 Application topically as needed for dry skin. Candelaria Stagers, DPM Taking Active Family Member  atorvastatin (LIPITOR) 80 MG tablet [id number] No TAKE 1 TABLET BY MOUTH DAILY  Patient taking differently: Take 80 mg by mouth daily with supper.   [name], MD Taking Active Family Member  azelastine (ASTELIN) 0.1 % nasal spray [id number] No Place 2 sprays into both nostrils daily. Use in each nostril as directed Kozlow, [name], MD Taking Active Family Member  benzonatate (TESSALON) 200 MG capsule [id number] No TAKE 1 CAPSULE BY MOUTH THREE TIMES DAILY AS NEEDED FOR COUGH  Patient taking differently: Take 200 mg by mouth 2 (two) times daily as needed for cough.   [name], MD Taking Active Family Member  Calcium-Magnesium-Vitamin D (CALCIUM MAGNESIUM PO) [id number] No Take 1 tablet by mouth at bedtime. [provider] Taking Active Family Member  cetirizine (ZYRTEC) 10 MG tablet [id number] No Take 1 tablet (10 mg total) by mouth 2 (two) times daily as needed for allergies (Can take a n extra dose during flare ups.).  Patient taking differently:  Take 10 mg by mouth at bedtime.   Kozlow, [name], MD Taking Active Family Member  Cholecalciferol (VITAMIN D3) 25 MCG (1000 UT) capsule [id number] No Take 1,000 Units by mouth at bedtime. [provider] Taking Active Family Member  clopidogrel (PLAVIX) 75 MG tablet [id number] No Take 1 tablet (75 mg total) by mouth daily. Jodelle Red, MD Taking Active   cyproheptadine (PERIACTIN) 4 MG tablet [id number] No Take 2 tablets (8 mg total) by mouth at  bedtime. Take 2 tablets at bedtime as needed to treat facial pain or sleep dysfunction.  Patient taking differently: Take 8 mg by mouth at bedtime.   Kozlow, [name], MD Taking Active Family Member  denosumab (PROLIA) 60 MG/ML SOSY injection [id number] No Inject 60 mg into the skin every 6 (six) months. [provider] Taking Active Family Member  dexlansoprazole (DEXILANT) 60 MG capsule [id number] No Take 1 capsule (60 mg total) by mouth daily. Kozlow, [name], MD Taking Active Family Member  empagliflozin (JARDIANCE) 10 MG TABS tablet [id number] No Take 1 tablet (10 mg total) by mouth daily before breakfast. [name], MD Taking Active Family Member  EPINEPHrine 0.3 mg/0.3 mL IJ SOAJ injection [id number] No USE AS DIRECTED BY YOUR PHYSICIAN INTRAMUSCULARLY AS NEEDED FOR ANAPHYLAXIS  Patient taking differently: Inject 0.3 mg into the muscle as needed for anaphylaxis.   Kozlow, [name], MD Taking Active Family Member  famotidine (PEPCID) 20 MG tablet [id number] No TAKE 1 TABLET BY MOUTH EACH NIGHT AT BEDTIME  Patient taking differently: Take 20 mg by mouth at bedtime.   Kozlow, [name], MD Taking Active Family Member  fluticasone [facility]) 50 MCG/ACT nasal spray [id number] No USE 2 SPRAYS IN EACH NOSTRIL DAILY  Patient taking differently: Place 2 sprays into both nostrils daily.   Martina Sinner, MD Taking Active Family Member  folic acid (FOLVITE) 1 MG tablet [id number] No Take 1 tablet (1 mg total) by mouth daily. [name], MD Taking Active Family Member  furosemide (LASIX) 40 MG tablet [id number]  DAILY AS NEEDED FOR WEIGHT GAIN OF 2 POUNDS OVERNIGHT OR 5 POUNDS IN ONE WEEK Alver Sorrow, NP  Active   gabapentin (NEURONTIN) 300 MG capsule [id number] No Take 1 capsule (300 mg total) by mouth at bedtime. Lewie Chamber, MD Taking Active   GOODSENSE ARTHRITIS PAIN 1 % GEL [id number] No Apply 1 Application topically 4 times daily as needed for pain. [name], MD Taking  Active   guaiFENesin (MUCINEX) 600 MG 12 hr tablet [id number] No Take 1 tablet (600 mg total) by mouth 2 (two) times daily as needed for cough or to loosen phlegm.  Patient taking differently: Take 600 mg by mouth 2 (two) times daily.   [name], MD Taking Active Family Member  HIZENTRA 1 GM/5ML SOLN [id number] No 13 g by Subcutaneous Infusion route See admin instructions. On hold while at rehab. Hizentra- 13 g infused every Friday  ("3 times/5 ml = 65 ml total once weekly") Lewie Chamber, MD Taking Active   ipratropium-albuterol (DUONEB) 0.5-2.5 (3) MG/3ML SOLN [id number] No Take 3 mLs by nebulization every 6 (six) hours as needed. Martina Sinner, MD Taking Active Family Member  isosorbide-hydrALAZINE (BIDIL) 20-37.5 MG tablet [id number]  Take 1 tablet by mouth 2 (two) times daily. Alver Sorrow, NP  Active   Lactase (LACTOSE FAST ACTING RELIEF PO) [id number] No Take 3 tablets by mouth 3 (three) times daily before meals. [provider] Taking Active  Family Member  lidocaine-prilocaine (EMLA) cream [id number] No Apply 1 application  topically as needed (pain). [provider] Taking Active Family Member  magnesium oxide (MAG-OX) 400 (240 Mg) MG tablet [id number] No Take 400 mg by mouth at bedtime. [provider] Taking Active Family Member  Menthol, Topical Analgesic, (BIOFREEZE) 4 % GEL [id number] No Apply 1 application  topically daily as needed (for shoulder or joint pain). [provider] Taking Active Family Member  mirtazapine (REMERON SOL-TAB) 30 MG disintegrating tablet [id number] No Take 1 tablet (30 mg total) by mouth at bedtime as needed (sleep).  Patient taking differently: Take 30 mg by mouth at bedtime.   [name], MD Taking Active Family Member  montelukast (SINGULAIR) 10 MG tablet [id number] No Take 1 tablet (10 mg total) by mouth daily.  Patient taking differently: Take 10 mg by mouth daily after supper.   [name], MD  Taking Active Family Member  Nystatin (GERHARDT'S BUTT CREAM) CREA [id number] No Apply topically 3 times a day as needed for itching or irritation [name], MD Taking Active Family Member  nystatin (MYCOSTATIN) 100000 UNIT/ML suspension [id number] No Use as directed 15 mLs in the mouth or throat at bedtime. [provider] Taking Active Family Member  potassium chloride (KLOR-CON M) 10 MEQ tablet [id number]  DAILY AS NEEDED WHEN TAKING LASIX Alver Sorrow, NP  Active   potassium chloride (KLOR-CON) 10 MEQ tablet [id number]  Take 10 mEq by mouth daily as needed. [provider]  Active   Prenatal Vit-Fe Fumarate-FA (PRENATAL VITAMINS PO) [id number] No Take 1 tablet by mouth daily. [provider] Taking Active Family Member  Probiotic Product (ALIGN PO) [id number] No Take 1 capsule by mouth daily. [provider] Taking Active Family Member  Propylene Glycol (SYSTANE COMPLETE) 0.6 % SOLN [id number] No Place 1 drop into both eyes 2 (two) times daily. Kozlow, [name], MD Taking Active Family Member  sacubitril-valsartan [facility]) 24-26 [place] [id number]  Take 1 tablet by mouth 2 (two) times daily. Jodelle Red, MD  Active   Tiotropium Bromide-Olodaterol (STIOLTO RESPIMAT) 2.5-2.5 MCG/ACT AERS [id number] No Inhale 2 puffs into the lungs daily.  Patient taking differently: Inhale 2 puffs into the lungs at bedtime.   Martina Sinner, MD Taking Active Family Member  traMADol Janean Sark) 50 MG tablet [id number] No Take 1 tablet (50 mg total) by mouth at bedtime. Lewie Chamber, MD Taking Active   Triamcinolone Acetonide (TRIAMCINOLONE 0.1 % CREAM : EUCERIN) CREA [id number] No Apply 1 Application topically 2 (two) times daily. [name], MD Taking Active   triamcinolone ointment (KENALOG) 0.5 % [id number] No Apply 1 Application topically 2 (two) times daily. Jaci Standard, MD Taking Active   XARELTO 20 MG TABS tablet [id number] No TAKE 1 TABLET BY MOUTH  DAILY WITH SUPPER  Patient taking differently: Take 20 mg by mouth daily with supper.   [name], MD Taking Active Family Member  Med List Note Geri Seminole, CPhT 01/04/21 1640):              SDOH:  (Social Determinants of Health) assessments and interventions performed: No , done within year Financial Resource Strain: Low Risk  (04/18/2022)   Overall Financial Resource Strain (CARDIA)    Difficulty of Paying Living Expenses: Not hard at all   Food Insecurity: No Food Insecurity (04/25/2022)   Hunger Vital Sign    Worried About Running Out of Food in the Last Year: Never true  Ran Out of Food in the Last Year: Never true    SDOH Interventions    Flowsheet Row ED to Hosp-Admission (Discharged) from 04/25/2022 in [place] [facility] Clinical Support from 07/13/2021 in [facility] Clinical Support from 07/07/2020 in [facility]  SDOH Interventions     Food Insecurity Interventions Intervention Not Indicated Intervention Not Indicated Intervention Not Indicated  Housing Interventions Intervention Not Indicated Intervention Not Indicated Intervention Not Indicated  Transportation Interventions Intervention Not Indicated Intervention Not Indicated Intervention Not Indicated  Utilities Interventions Intervention Not Indicated -- --  Financial Strain Interventions -- -- Intervention Not Indicated  Physical Activity Interventions -- Other (Comments) Intervention Not Indicated  Stress Interventions -- Intervention Not Indicated Intervention Not Indicated  Social Connections Interventions -- Intervention Not Indicated Intervention Not Indicated       Medication Assistance: None required.  Patient affirms current coverage meets needs.  Medication Access: Name and location of current pharmacy:  [facility] - [place], [place] - [address] [place] [address] Phone:  [phone] Fax: [phone]  Within the past 30 days, how often has patient missed a dose of medication? 0 Is a pillbox or other method used to improve adherence? No  Factors that may affect medication adherence? no barriers identified Are meds synced by current pharmacy? No  Are meds delivered by current pharmacy? No  Does patient experience delays in picking up medications due to transportation concerns? No   Compliance/Adherence/Medication fill history: Care Gaps: Annual wellness visit in last year? No last done 07/13/21   If Diabetic:  Last eye exam / retinopathy screening: due 03/08/23 Last diabetic foot exam: due 10/12/22  Star Meds: Jardiance 10mg  06/18/22 30ds Atorvastatin 80mg  03/21 24 90ds   Assessment/Plan   Heart Failure (Goal: manage symptoms and prevent exacerbations) -Controlled -Last ejection fraction: 30-35% (Date: 07/17/22) -HF type: HFrEF (EF < 40%)  -Current treatment: Jardiance 10mg  Appropriate, Effective, Safe, Accessible Furosemide 40mg  prn Appropriate, Effective, Safe, Accessible Entresto 24-26mg  BID Appropriate, Effective, Safe, Accessible Bidil 20-37.5mg  bid Appropriate, Effective, Safe, Accessible -Medications previously tried: none noted  -Current home BP/HR readings: controlled -Current home daily weights: consistent  -Educated on Benefits of medications for managing symptoms and prolonging life Importance of weighing daily; if you gain more than 3 pounds in one day or 5 pounds in one week, contact providers -Recommended to continue current medication Patient has consultation for possible pacemaker.  Confirmed she was taking medication correctly, Entresto and Bidil now twice daily.  Reports 100% adherence.  Meds are affordable at this time, please reach out to me if this changes.     Willa Frater, PharmD, CPP Clinical Pharmacist Practitioner [facility] [phone]

## 2022-07-20 ENCOUNTER — Encounter: Payer: Self-pay | Admitting: Pulmonary Disease

## 2022-07-20 ENCOUNTER — Ambulatory Visit: Payer: Medicare PPO | Admitting: Pulmonary Disease

## 2022-07-20 VITALS — BP 122/68 | HR 75 | Temp 98.2°F | Ht 61.0 in | Wt 133.0 lb

## 2022-07-20 DIAGNOSIS — J479 Bronchiectasis, uncomplicated: Secondary | ICD-10-CM | POA: Diagnosis not present

## 2022-07-20 DIAGNOSIS — J4551 Severe persistent asthma with (acute) exacerbation: Secondary | ICD-10-CM | POA: Diagnosis not present

## 2022-07-20 DIAGNOSIS — M25512 Pain in left shoulder: Secondary | ICD-10-CM | POA: Diagnosis not present

## 2022-07-20 MED ORDER — STIOLTO RESPIMAT 2.5-2.5 MCG/ACT IN AERS: 2.0000 | INHALATION_SPRAY | Freq: Every day | RESPIRATORY_TRACT | 11 refills | Status: DC

## 2022-07-20 MED ORDER — BUDESONIDE 0.5 MG/2ML IN SUSP
0.5000 mg | Freq: Two times a day (BID) | RESPIRATORY_TRACT | 11 refills | Status: DC
Start: 2022-07-20 — End: 2023-01-21

## 2022-07-20 MED ORDER — PREDNISONE 10 MG PO TABS
ORAL_TABLET | ORAL | 0 refills | Status: DC
Start: 2022-07-20 — End: 2022-07-30

## 2022-07-20 NOTE — Progress Notes (Unsigned)
Synopsis: Referred in 2011 for asthma by [name], MD.  Previously a patient of Drs. Rayford Halsted, McQuaid and Olivia Lopez de Gutierrez.  Subjective:   PATIENT ID: Ebony Scott GENDER: female DOB: [birth year], MRN: [id number]  HPI  Chief Complaint  Patient presents with   Follow-up    F/U after PFT. States she has a productive cough with yellowish phlegm. She had an upset stomach and diarrhea yesterday. Increased fatigue.    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   PFTs show mild restrictive defect and mild diffusion defect.   She is having increased cough and wheezing of late. She had a 24 hours GI bug yesterday with diarrhea and fatigue.   OV 03/19/22 She was treated with azithromycin and prednisone 11/24/21 for bronchitis.   She has increased cough and mucous production over recent weeks. No fevers or chills. She has left sided nose/sinus drainage with pressure under the left eye.   OV 11/15/21 She reports increased cough and wheezing over recent weeks. She was placed on extended steroid taper with some improvement but she reports it has progressed since completing the taper. She denies fevers chills or change in mucous production. She continues to produce yellow/clear sputum.   She received her RSV and influenza vaccines.  OV 03/15/21 She has been doing okay since last visit.  She reports persistent wheezing and cough at night.  She is coughing up thick yellow sputum.  She is currently taking 12.5 mg of prednisone daily for her rheumatoid arthritis and is being tapered down by Dr. Sedalia Muta of rheumatology.  She is receiving IVIG infusions via Dr. Lucie Leather.  She reports she is having left ear and sinus congestion with some postnasal drainage.  He has been doing okay on Stiolto inhaler.  OV  11/15/20 She reports increased wheezing, cough and dyspnea since last visit. She is having a lot of wheezing at night. She remains on stiolto respimat daily and is using albuterol nebulizer treatments twice daily with flutter valve for mucous clearance. She complains of cough when eating/drinking.  She is being started on IVIG infusions by Dr. 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 She remains on prednisone 12mg  daily for her rheumatoid arthritis.  We discussed her CT chest results from 08/23/20 which showed resolution of her pulmonary nodules with residual areas of scarring.  OV 06/14/20 She has completed two courses of diflucan per ENT for laryngeal candidiasis and her voice is much improved. We discontinued her inhaled steroid during this period of time as well. She is noticing more shortness of breath during the day.   She was recently transitioned to rituximab by her rheumatoligist and has been taken off liflunamide. She continues on 15mg  of prednisone daily.   CT Chest on 4/6 when compared to the CT Chest on 2/4 shows a decrease in the size of the nodular/consolidative opacities of the medial right lung base and left lower lobe. No new nodules or lesions noted.   OV 04/29/20 She underwent bronchoscopy on 04/05/20 and cultures remain unrevealing at this time. The BAL samples have grown candida albicans and we discussed that we typically do not treat this finding as it does not usually cause pathogenic disease.   She continues to experience hoarse voice and cough along with shortness of breath. She denies weight loss, night sweats, loss  of appetite.   OV 04/01/20: CT scan 03/25/2020 showed multifocal areas of nodularity and consolidative changes. Findings consistent with atypical infection or possible malignancy. She was started on azithromycin and cefpodoxime by Dr. Tanya Nones. She has completed the azithromycin and she started the cefpodoxime on 2/7. She was treated with levaquin on  03/21/20 for 7 days initially until the CT scan resulted. Blood cultures were negative on 02/23/20 when she presented with AMS.   She reports she is coughing up brown sputum. At baseline she doe snot cough up sputum or it is clear. The sputum amount has declined she taking antibiotics. She denies fevers, chills or sweats. Her appetite is well.    OV 09/01/19: [name] is a 72 y/o woman who presents for follow up of bronchiectasis, tracheobronchomalacia, immunosuppression, chronic AC for recurrent VTE. She started prescribed levofloxacin last evening for Pseudomonas bronchiectasis AE. So far she has not had nausea associated with taking this med, but took zofran prophylacticaly. She remains SOB with no change so far in her symptoms. She still coughs frequently despite using her inhaler and hypertonic saline nebs + flutter as prescribed. She has follow up with her PCP soon for hyperglycemiaa. She has follow up with her rheumatologist in August and is hoping that her prednisone will be able to be decreased soon.  Past Medical History:  Diagnosis Date   Abscess of dorsum of right hand 09/22/2020   Acute deep vein thrombosis (DVT) of right lower extremity (HCC) 12/13/2017   Allergic rhinitis    Anemia    Angio-edema    Anxiety    pt denies dx   Arthritis    Phreesia 10/21/2019   Asthma    Phreesia 10/21/2019   Breast cancer (HCC) 1998   Left breast, in remission   Bronchiectasis (HCC)    Cataract    REMOVED   CHF (congestive heart failure) (HCC)    Clostridium difficile colitis 01/14/2018   Complication of anesthesia    "had hard time waking up from it several times" (02/20/2012), no anesthesia problems since 2014   Coronary artery disease    Depression    "some; don't take anything for it" (02/20/2012), pt denies dx as of 06/02/21   Diverticulosis    DVT (deep venous thrombosis) (HCC)    Dyspnea    all the time   Fibromyalgia 11/2011   GAVE (gastric antral vascular ectasia)    GERD  (gastroesophageal reflux disease)    Graves disease    Headache(784.0)    "related to allergies; more at different times during the year" (02/20/2012)   Hemorrhoids    Hiatal hernia    back and neck   History of blood transfusion 2023   1 unit, 5 units of Iron   Hx of adenomatous colonic polyps 04/12/2016   Hypercholesteremia    good cholesterol is high   Hypothyroidism    IBS (irritable bowel syndrome)    Moderate persistent asthma    -FeV1 72% 2011, -IgE 102 2011, CT sinus Neg 2011   Osteomyelitis of second toe of right foot (HCC) 09/23/2020   Osteoporosis    on reclast yearly   Peripheral vascular disease (HCC) 2019   DVTs (3) lungs and leg   Pneumonia 04/2011; ~ 11/2011   "double; single" (02/20/2012)   Pneumonia    Septic olecranon bursitis of right elbow 09/22/2020   Seronegative rheumatoid arthritis (HCC)    Dr. Casimer Lanius   SIRS (systemic inflammatory response syndrome) (HCC) 02/10/2018   Sleep  apnea    uses cpap nightly   Tracheobronchomalacia      Family History  Problem Relation Age of Onset   Allergies Mother    Heart disease Mother    Arthritis Mother    Lung cancer Mother        heavy smoker   Diabetes Mother    Allergies Father    Heart disease Father    Arthritis Father    Stroke Father    Heart disease Brother    Diabetes Maternal Grandfather    Colitis Daughter    Colon cancer Other        Maternal half uncle     Social History   Socioeconomic History   Marital status: Divorced    Spouse name: Not on file   Number of children: 2   Years of education: college   Highest education level: Not on file  Occupational History   Occupation: Disabled    Comment: Retired Dispensing optician: RETIRED  Tobacco Use   Smoking status: Never    Passive exposure: Past   Smokeless tobacco: Never   Tobacco comments:    Parents  Vaping Use   Vaping Use: Never used  Substance and Sexual Activity   Alcohol use: Not Currently     Alcohol/week: 0.0 standard drinks of alcohol   Drug use: No   Sexual activity: Not Currently    Birth control/protection: Surgical    Comment: Hysterectomy  Other Topics Concern   Not on file  Social History Narrative   Patient lives at home alone. Patient  divorced.    Patient has her BS degree.   Right handed.   Caffeine- sometimes coffee.      [facility]:   Born in [place], [place]. She worked as a Armed forces operational officer. She has no pets currently. She does have indoor plants. Previously had mold in her home that was remediated. Carpet was removed.          Social Determinants of Health   Financial Resource Strain: Low Risk  (04/18/2022)   Overall Financial Resource Strain (CARDIA)    Difficulty of Paying Living Expenses: Not hard at all  Food Insecurity: No Food Insecurity (04/25/2022)   Hunger Vital Sign    Worried About Running Out of Food in the Last Year: Never true    Ran Out of Food in the Last Year: Never true  Transportation Needs: No Transportation Needs (04/25/2022)   PRAPARE - Administrator, Civil Service (Medical): No    Lack of Transportation (Non-Medical): No  Physical Activity: Insufficiently Active (07/13/2021)   Exercise Vital Sign    Days of Exercise per Week: 5 days    Minutes of Exercise per Session: 20 min  Stress: No Stress Concern Present (07/13/2021)   [employer] of Occupational Health - Occupational Stress Questionnaire    Feeling of Stress : Only a little  Social Connections: Moderately Isolated (07/13/2021)   Social Connection and Isolation Panel [NHANES]    Frequency of Communication with Friends and Family: More than three times a week    Frequency of Social Gatherings with Friends and Family: More than three times a week    Attends Religious Services: 1 to 4 times per year    Active Member of [employer] or Organizations: No    Attends Banker Meetings: Never    Marital Status: Divorced  Catering manager Violence: Not At  Risk (04/25/2022)   Humiliation, Afraid,  Rape, and Kick questionnaire    Fear of Current or Ex-Partner: No    Emotionally Abused: No    Physically Abused: No    Sexually Abused: No     Allergies  Allergen Reactions   Baclofen Anaphylaxis and Other (See Comments)    Altered mental status requiring a 3-day hospital stay- patient became unresponsive   "Put me in a coma for five days", Altered mental status requiring a 3-day hospital stay- patient became unresponsive   Dust Mite Extract Shortness Of Breath and Other (See Comments)    "sneezing" (02/20/2012) too   Molds & Smuts Shortness Of Breath   Morphine And Codeine Hives and Itching   Other Shortness Of Breath and Other (See Comments)    Grass and weeds "sneezing; filled sinuses" (02/20/2012)   Penicillins Rash and Other (See Comments)    "welts" (02/20/2012)  Tolerated Ancef 02/16/20   Rofecoxib Swelling and Other (See Comments)    Vioxx- feet swelling   Shellfish Allergy Anaphylaxis, Shortness Of Breath, Itching and Rash   Shrimp Extract Anaphylaxis and Other (See Comments)    ALL SHELLFISH   Tetracycline Hcl Nausea And Vomiting   Tetracycline Hcl Other (See Comments)    GI Intolerance   Xolair [Omalizumab] Other (See Comments)    Caused Blood clot   Zoledronic Acid Other (See Comments)    Reclast- Fever, Put in hospital, dr said it was a reaction from a reaction    Fever, inability to walk, Put in hospital  Fever, Put in hospital, dr said it was a reaction from a reaction , Other reaction(s): Unknown   Dilaudid [Hydromorphone Hcl] Itching   Hydrocodone-Acetaminophen Itching   Levofloxacin Other (See Comments)    GI upset  Other Reaction(s): GI Intolerance, Other (See Comments)  REACTION: GI upset, GI upset   Oxycodone Hcl Nausea And Vomiting   Oxycodone Hcl Other (See Comments)    GI Intolerance   Paroxetine Nausea And Vomiting and Other (See Comments)    GI Intolerance also   Celecoxib Swelling and Other (See  Comments)    Feet swelling   Diltiazem Hcl Swelling   Lactose Other (See Comments)    Bloating and gas   Lactose Intolerance (Gi) Other (See Comments)    Bloating and gas   Oxycodone-Acetaminophen Itching   Rituximab Other (See Comments)    Anemia    Tree Extract Other (See Comments)    "tested and told I was allergic to it; never experienced a reaction to it" (02/20/2012)   Gamunex [Immune Globulin] Itching and Rash    Tolerates hizentra    Penicillin G Procaine Rash and Other (See Comments)    Welts, also     Outpatient Medications Prior to Visit  Medication Sig Dispense Refill   acetaminophen (TYLENOL) 650 MG CR tablet Take 1,300 mg by mouth daily as needed for pain.     acetic acid-hydrocortisone (VOSOL-HC) OTIC solution Place 4 drops into both ears See admin instructions. Instill 4 drops into both ears the first 5 days of the month, then as directed/as needed for itching     albuterol (VENTOLIN HFA) 108 (90 Base) MCG/ACT inhaler Inhale 2 puffs into the lungs every 6 (six) hours as needed for wheezing or shortness of breath. 19 g 1   allopurinol (ZYLOPRIM) 100 MG tablet TAKE 1 TABLET BY MOUTH DAILY (Patient taking differently: Take 100 mg by mouth daily with supper.) 90 tablet 2   Alpha-D-Galactosidase (BEANO PO) Take 2 tablets by mouth 3 (  three) times daily before meals.     Alpha-Lipoic Acid 600 MG TABS Take 600 mg by mouth daily.     ammonium lactate (AMLACTIN DAILY) 12 % lotion Apply 1 Application topically as needed for dry skin. 400 g 0   atorvastatin (LIPITOR) 80 MG tablet TAKE 1 TABLET BY MOUTH DAILY (Patient taking differently: Take 80 mg by mouth daily with supper.) 90 tablet 2   azelastine (ASTELIN) 0.1 % nasal spray Place 2 sprays into both nostrils daily. Use in each nostril as directed 30 mL 5   benzonatate (TESSALON) 200 MG capsule TAKE 1 CAPSULE BY MOUTH THREE TIMES DAILY AS NEEDED FOR COUGH (Patient taking differently: Take 200 mg by mouth 2 (two) times daily as needed  for cough.) 30 capsule 0   Calcium-Magnesium-Vitamin D (CALCIUM MAGNESIUM PO) Take 1 tablet by mouth at bedtime.     cetirizine (ZYRTEC) 10 MG tablet Take 1 tablet (10 mg total) by mouth 2 (two) times daily as needed for allergies (Can take a n extra dose during flare ups.). (Patient taking differently: Take 10 mg by mouth at bedtime.) 60 tablet 5   Cholecalciferol (VITAMIN D3) 25 MCG (1000 UT) capsule Take 1,000 Units by mouth at bedtime.     clopidogrel (PLAVIX) 75 MG tablet Take 1 tablet (75 mg total) by mouth daily. 90 tablet 3   cyproheptadine (PERIACTIN) 4 MG tablet Take 2 tablets (8 mg total) by mouth at bedtime. Take 2 tablets at bedtime as needed to treat facial pain or sleep dysfunction. (Patient taking differently: Take 8 mg by mouth at bedtime.) 60 tablet 5   denosumab (PROLIA) 60 MG/ML SOSY injection Inject 60 mg into the skin every 6 (six) months.     dexlansoprazole (DEXILANT) 60 MG capsule Take 1 capsule (60 mg total) by mouth daily. 90 capsule 3   empagliflozin (JARDIANCE) 10 MG TABS tablet Take 1 tablet (10 mg total) by mouth daily before breakfast. 90 tablet 1   EPINEPHrine 0.3 mg/0.3 mL IJ SOAJ injection USE AS DIRECTED BY YOUR PHYSICIAN INTRAMUSCULARLY AS NEEDED FOR ANAPHYLAXIS (Patient taking differently: Inject 0.3 mg into the muscle as needed for anaphylaxis.) 2 each 1   famotidine (PEPCID) 20 MG tablet TAKE 1 TABLET BY MOUTH EACH NIGHT AT BEDTIME (Patient taking differently: Take 20 mg by mouth at bedtime.) 30 tablet 1   fluticasone (FLONASE) 50 MCG/ACT nasal spray USE 2 SPRAYS IN EACH NOSTRIL DAILY (Patient taking differently: Place 2 sprays into both nostrils daily.) 16 g 2   folic acid (FOLVITE) 1 MG tablet Take 1 tablet (1 mg total) by mouth daily. 90 tablet 3   furosemide (LASIX) 40 MG tablet DAILY AS NEEDED FOR WEIGHT GAIN OF 2 POUNDS OVERNIGHT OR 5 POUNDS IN ONE WEEK 90 tablet 2   gabapentin (NEURONTIN) 300 MG capsule Take 1 capsule (300 mg total) by mouth at bedtime.      GOODSENSE ARTHRITIS PAIN 1 % GEL Apply 1 Application topically 4 times daily as needed for pain. 100 g 3   guaiFENesin (MUCINEX) 600 MG 12 hr tablet Take 1 tablet (600 mg total) by mouth 2 (two) times daily as needed for cough or to loosen phlegm. (Patient taking differently: Take 600 mg by mouth 2 (two) times daily.) 60 tablet 1   HIZENTRA 1 GM/5ML SOLN 13 g by Subcutaneous Infusion route See admin instructions. On hold while at rehab. Hizentra- 13 g infused every Friday  ("3 times/5 ml = 65 ml total once weekly")  ipratropium-albuterol (DUONEB) 0.5-2.5 (3) MG/3ML SOLN Take 3 mLs by nebulization every 6 (six) hours as needed. 360 mL 6   isosorbide-hydrALAZINE (BIDIL) 20-37.5 MG tablet Take 1 tablet by mouth 2 (two) times daily. 180 tablet 3   Lactase (LACTOSE FAST ACTING RELIEF PO) Take 3 tablets by mouth 3 (three) times daily before meals.     lidocaine-prilocaine (EMLA) cream Apply 1 application  topically as needed (pain).     magnesium oxide (MAG-OX) 400 (240 Mg) MG tablet Take 400 mg by mouth at bedtime.     Menthol, Topical Analgesic, (BIOFREEZE) 4 % GEL Apply 1 application  topically daily as needed (for shoulder or joint pain).     mirtazapine (REMERON SOL-TAB) 30 MG disintegrating tablet Take 1 tablet (30 mg total) by mouth at bedtime as needed (sleep). (Patient taking differently: Take 30 mg by mouth at bedtime.) 90 tablet 3   montelukast (SINGULAIR) 10 MG tablet Take 1 tablet (10 mg total) by mouth daily. (Patient taking differently: Take 10 mg by mouth daily after supper.) 90 tablet 1   Nystatin (GERHARDT'S BUTT CREAM) CREA Apply topically 3 times a day as needed for itching or irritation 4 each 6   nystatin (MYCOSTATIN) 100000 UNIT/ML suspension Use as directed 15 mLs in the mouth or throat at bedtime.     potassium chloride (KLOR-CON M) 10 MEQ tablet DAILY AS NEEDED WHEN TAKING LASIX 90 tablet 1   potassium chloride (KLOR-CON) 10 MEQ tablet Take 10 mEq by mouth daily as needed.      Prenatal Vit-Fe Fumarate-FA (PRENATAL VITAMINS PO) Take 1 tablet by mouth daily.     Probiotic Product (ALIGN PO) Take 1 capsule by mouth daily.     Propylene Glycol (SYSTANE COMPLETE) 0.6 % SOLN Place 1 drop into both eyes 2 (two) times daily. 15 mL 5   sacubitril-valsartan (ENTRESTO) 24-26 MG Take 1 tablet by mouth 2 (two) times daily. 180 tablet 1   traMADol (ULTRAM) 50 MG tablet Take 1 tablet (50 mg total) by mouth at bedtime. 10 tablet 0   Triamcinolone Acetonide (TRIAMCINOLONE 0.1 % CREAM : EUCERIN) CREA Apply 1 Application topically 2 (two) times daily. 1 each 1   triamcinolone ointment (KENALOG) 0.5 % Apply 1 Application topically 2 (two) times daily. 30 g 1   XARELTO 20 MG TABS tablet TAKE 1 TABLET BY MOUTH DAILY WITH SUPPER (Patient taking differently: Take 20 mg by mouth daily with supper.) 90 tablet 2   Tiotropium Bromide-Olodaterol (STIOLTO RESPIMAT) 2.5-2.5 MCG/ACT AERS Inhale 2 puffs into the lungs daily. (Patient taking differently: Inhale 2 puffs into the lungs at bedtime.) 4 g 11   Facility-Administered Medications Prior to Visit  Medication Dose Route Frequency Provider Last Rate Last Admin   iohexol (OMNIPAQUE) 350 MG/ML injection    PRN Marykay Lex, MD   105 mL at 01/11/21 0955   Review of Systems  Constitutional:  Negative for chills, fever, malaise/fatigue and weight loss.  HENT:  Negative for congestion, ear pain, sinus pain and sore throat.   Eyes: Negative.   Respiratory:  Positive for cough, sputum production, shortness of breath and wheezing. Negative for hemoptysis.   Cardiovascular:  Negative for chest pain, palpitations, orthopnea, claudication and leg swelling.  Gastrointestinal:  Negative for abdominal pain, heartburn, nausea and vomiting.  Genitourinary: Negative.   Musculoskeletal:  Negative for joint pain and myalgias.  Skin:  Negative for rash.  Neurological:  Negative for weakness.  Endo/Heme/Allergies: Negative.   Psychiatric/Behavioral:  Negative.  Objective:   Vitals:   07/20/22 1428  BP: 122/68  Pulse: 75  Temp: 98.2 F (36.8 C)  TempSrc: Oral  SpO2: 96%  Weight: 133 lb (60.3 kg)  Height: 5\' 1"  (1.549 m)   Physical Exam Constitutional:      General: She is not in acute distress.    Appearance: She is not ill-appearing.  HENT:     Head: Normocephalic and atraumatic.  Eyes:     General: No scleral icterus.    Conjunctiva/sclera: Conjunctivae normal.  Cardiovascular:     Rate and Rhythm: Normal rate and regular rhythm.     Pulses: Normal pulses.     Heart sounds: Normal heart sounds. No murmur heard. Pulmonary:     Effort: Pulmonary effort is normal.     Breath sounds: Wheezing and rhonchi present. No rales.  Musculoskeletal:     Right lower leg: No edema.     Left lower leg: No edema.  Skin:    General: Skin is warm and dry.  Neurological:     General: No focal deficit present.     Mental Status: She is alert.    CBC    Component Value Date/Time   WBC 10.3 06/14/2022 1012   WBC 13.7 (H) 05/29/2022 1228   RBC 2.85 (L) 06/14/2022 1012   RBC 2.78 (L) 06/14/2022 1012   HGB 10.1 (L) 06/14/2022 1012   HGB 6.4 (LL) 02/03/2021 0817   HCT 30.4 (L) 06/14/2022 1012   HCT 20.6 (L) 02/03/2021 0817   PLT 224 06/14/2022 1012   PLT 306 02/03/2021 0817   MCV 109.4 (H) 06/14/2022 1012   MCV 102 (H) 02/03/2021 0817   MCH 36.3 (H) 06/14/2022 1012   MCHC 33.2 06/14/2022 1012   RDW 17.7 (H) 06/14/2022 1012   RDW 16.3 (H) 02/03/2021 0817   LYMPHSABS 1.4 06/14/2022 1012   MONOABS 1.1 (H) 06/14/2022 1012   EOSABS 1.3 (H) 06/14/2022 1012   BASOSABS 0.1 06/14/2022 1012      Latest Ref Rng & Units 06/14/2022   10:12 AM 05/29/2022   12:28 PM 05/02/2022    5:42 AM  BMP  Glucose 70 - 99 mg/dL 161  096  045   BUN 8 - 23 mg/dL 19  29  40   Creatinine 0.44 - 1.00 mg/dL 4.09  8.11  9.14   BUN/Creat Ratio 6 - 22 (calc)  21    Sodium 135 - 145 mmol/L 142  142  142   Potassium 3.5 - 5.1 mmol/L 4.3  4.9  3.9    Chloride 98 - 111 mmol/L 110  105  107   CO2 22 - 32 mmol/L 24  26  23    Calcium 8.9 - 10.3 mg/dL 9.4  9.4  8.2    Chest imaging: Chest Imaging- films reviewed: HRCT Chest 04/16/22 1. Bland appearing scarring of the bilateral lung bases with mild associated tubular bronchiectasis, similar to prior examination. No findings of fibrotic interstitial lung disease. 2. Fine, clustered centrilobular and tree-in-bud nodularity of the posterior right upper lobe, unchanged and consistent with atypical infection, including atypical mycobacterium. No evidence of ongoing infection. 3. Minimal subpleural radiation fibrosis of the anterior left lung. 4. Postoperative findings of left lumpectomy. 5. Coronary artery disease. 6. Aortic valve calcifications. Correlate for echocardiographic evidence of aortic valve dysfunction  CT Chest wo contrast 08/23/20 - new trace bilateral pleural effusions and septal thickening. Decrease and resolution of pulmonary nodules.   CT Chest wo contrast 05/25/20 - near  resolution of the RUL 7mm nodule. Nodular opacity of medial RUL is now 6mm from 13mm. The opacity of the medial RLL is now 1.8x1.0cm from 2.0x1.1 and the LLL opacity is 1.9x1.6cm from 3.7x3.7cm.  CT chest wo contrast 03/25/2020 - multifocal areas of nodularity and consolidative changes. Findings consistent with atypical infection or possible malignancy.  CT high-resolution 10/24/2018-mild bronchiectasis right middle lobe, lingula, right lower lobe, but does taper more appropriately distally.  Persistent nodules in right middle lobe and lingula with mild tree-in-bud opacities.  No inter or intralobular septal thickening or significant groundglass.  Expiratory images with some mosaicism and significant bronchial and tracheal collapse, especially on the right.    HRCT chest 08/30/19: RUL TIB opacities, multilobar bronchiectasis. Tracheobronchomalacia progressed since previous CT scan. Subacute rib fractures- healing.  CAD.  PFT:    Latest Ref Rng & Units 06/28/2022    3:37 PM 08/20/2019    9:55 AM 09/18/2018   10:00 AM 06/19/2017    9:48 AM 08/08/2015   10:35 AM 04/29/2015    9:55 AM  PFT Results  FVC-Pre L 1.62  1.72  1.74  P 2.00  2.08  2.24   FVC-Predicted Pre % 65  66  66  P 74  75  81   FVC-Post L 1.84  1.65  1.61  P 1.86  2.17  2.25   FVC-Predicted Post % 73  63  61  P 69  78  81   Pre FEV1/FVC % % 90  90  88  P 90  83  87   Post FEV1/FCV % % 89  90  91  P 93  87  84   FEV1-Pre L 1.46  1.55  1.53  P 1.80  1.73  1.95   FEV1-Predicted Pre % 78  79  76  P 88  82  92   FEV1-Post L 1.64  1.49  1.47  P 1.72  1.89  1.89   DLCO uncorrected ml/min/mmHg 10.70  11.33  13.51  P 13.68  12.36  12.99   DLCO UNC% % 61  64  77  P 66  60  63   DLCO corrected ml/min/mmHg 12.14  11.33    13.76  12.87   DLCO COR %Predicted % 70  64    66  62   DLVA Predicted % 95  83  92  P 85  94  81   TLC L 3.24  3.71  3.52  P 3.95   3.91   TLC % Predicted % 70  80  76  P 85   84   RV % Predicted % 72  76  79  P 86   83     P Preliminary result   2020-no significant obstruction, mild restriction, mild DLCO reduction.  Flow volume loops consistent with restriction.   2021-no obstruction or bronchodilator reversibility.  No significant restriction.  Mild diffusion impairment.  Compared to 2020, mild improvement.   Previous respiratory cultures- all fungal and AFB negative from right middle lobe and lingula during bronchoscopy 08/2016, only positive for normal flora  BAL 04/05/20 - Candida Albicans    Pathology 08/23/2016: BAL lingula-no AFB or fungus   Echocardiogram: Unable to review report from 02/2018.  08/05/2017 demonstrates LVEF 65 to 70%, grade 1 diastolic dysfunction.  Normal LA, RV, RA.  Trivial TR and MR, otherwise normal valves.   Echocardiogram 07/10/2019-LVEF greater than 75% with hyperdynamic function, moderate LVH with grade 1 diastolic dysfunction.  Normal  LA, RV, RA.  Trivial AR, otherwise normal valves.  Allergy  panel 04/02/2016-increased IgE for Aspergillus, otherwise negative Alpha-1 antitrypsin 170   Bronchoscopy 12/24/2018 (performed at [facility]) Neutrophils 12% Lymphocytes 1% Macrophages 79% Respiratory viral panel negative Unable to see PJP DNA Respiratory culture-1+ mixed gram-negative rods, normal flora Fungus culture negative AFB negative   08/25/19 sputum- Pan-sensitive Pseudomonas   Barium Swallow 02/15/2015  1. Small type 1 transient hiatal hernia. 2. Incomplete epiglottic inversion during swallowing, with laryngeal penetration of thick liquid to the level of the vocal cords. 3. No stricture or dysmotility identified. 4. 4 mm anterolisthesis at C4-5.  Assessment & Plan:   Severe persistent asthma with acute exacerbation - Plan: predniSONE (DELTASONE) 10 MG tablet, budesonide (PULMICORT) 0.5 MG/2ML nebulizer solution  Bronchiectasis without complication (HCC) - Plan: Tiotropium Bromide-Olodaterol (STIOLTO RESPIMAT) 2.5-2.5 MCG/ACT AERS  Discussion: Ebony Scott is a 72 year old woman, never smoker with asthma, tracheobronchomalacia on CPAP with oxygen, bronchiectasis and rheumatoid arthritis on prednisone (previously on leflunamide and rituximab) who returns to pulmonary clinic for follow up.   She appears to have increase in her asthma symptoms with possible exacerbation. She is to start prolonged steroid taper along with airway clearance regimen of nebulizer treatments, flutter valve and chest vest.    She is to continue budesonide nebs twice daily along with stiolto inhaler and as needed albuterol.   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  She also has bronchiectasis and tracheobronchomalacia.  Follow up in 4 months  Melody Comas, MD [facility] Office: [phone]   Current Outpatient  Medications:    acetaminophen (TYLENOL) 650 MG CR tablet, Take 1,300 mg by mouth daily as needed for pain., Disp: , Rfl:    acetic acid-hydrocortisone (VOSOL-HC) OTIC solution, Place 4 drops into both ears See admin instructions. Instill 4 drops into both ears the first 5 days of the month, then as directed/as needed for itching, Disp: , Rfl:    albuterol (VENTOLIN HFA) 108 (90 Base) MCG/ACT inhaler, Inhale 2 puffs into the lungs every 6 (six) hours as needed for wheezing or shortness of breath., Disp: 19 g, Rfl: 1   allopurinol (ZYLOPRIM) 100 MG tablet, TAKE 1 TABLET BY MOUTH DAILY (Patient taking differently: Take 100 mg by mouth daily with supper.), Disp: 90 tablet, Rfl: 2   Alpha-D-Galactosidase (BEANO PO), Take 2 tablets by mouth 3 (three) times daily before meals., Disp: , Rfl:    Alpha-Lipoic Acid 600 MG TABS, Take 600 mg by mouth daily., Disp: , Rfl:    ammonium lactate (AMLACTIN DAILY) 12 % lotion, Apply 1 Application topically as needed for dry skin., Disp: 400 g, Rfl: 0   atorvastatin (LIPITOR) 80 MG tablet, TAKE 1 TABLET BY MOUTH DAILY (Patient taking differently: Take 80 mg by mouth daily with supper.), Disp: 90 tablet, Rfl: 2   azelastine (ASTELIN) 0.1 % nasal spray, Place 2 sprays into both nostrils daily. Use in each nostril as directed, Disp: 30 mL, Rfl: 5   benzonatate (TESSALON) 200 MG capsule, TAKE 1 CAPSULE BY MOUTH THREE TIMES DAILY AS NEEDED FOR COUGH (Patient taking differently: Take 200 mg by mouth 2 (two) times daily as needed for cough.), Disp: 30 capsule, Rfl: 0   budesonide (PULMICORT) 0.5 MG/2ML nebulizer solution, Take 2 mLs (0.5 mg total) by nebulization 2 (two) times daily., Disp: 120 mL,  Rfl: 11   Calcium-Magnesium-Vitamin D (CALCIUM MAGNESIUM PO), Take 1 tablet by mouth at bedtime., Disp: , Rfl:    cetirizine (ZYRTEC) 10 MG tablet, Take 1 tablet (10 mg total) by mouth 2 (two) times daily as needed for allergies (Can take a n extra dose during flare ups.). (Patient  taking differently: Take 10 mg by mouth at bedtime.), Disp: 60 tablet, Rfl: 5   Cholecalciferol (VITAMIN D3) 25 MCG (1000 UT) capsule, Take 1,000 Units by mouth at bedtime., Disp: , Rfl:    clopidogrel (PLAVIX) 75 MG tablet, Take 1 tablet (75 mg total) by mouth daily., Disp: 90 tablet, Rfl: 3   cyproheptadine (PERIACTIN) 4 MG tablet, Take 2 tablets (8 mg total) by mouth at bedtime. Take 2 tablets at bedtime as needed to treat facial pain or sleep dysfunction. (Patient taking differently: Take 8 mg by mouth at bedtime.), Disp: 60 tablet, Rfl: 5   denosumab (PROLIA) 60 MG/ML SOSY injection, Inject 60 mg into the skin every 6 (six) months., Disp: , Rfl:    dexlansoprazole (DEXILANT) 60 MG capsule, Take 1 capsule (60 mg total) by mouth daily., Disp: 90 capsule, Rfl: 3   empagliflozin (JARDIANCE) 10 MG TABS tablet, Take 1 tablet (10 mg total) by mouth daily before breakfast., Disp: 90 tablet, Rfl: 1   EPINEPHrine 0.3 mg/0.3 mL IJ SOAJ injection, USE AS DIRECTED BY YOUR PHYSICIAN INTRAMUSCULARLY AS NEEDED FOR ANAPHYLAXIS (Patient taking differently: Inject 0.3 mg into the muscle as needed for anaphylaxis.), Disp: 2 each, Rfl: 1   famotidine (PEPCID) 20 MG tablet, TAKE 1 TABLET BY MOUTH EACH NIGHT AT BEDTIME (Patient taking differently: Take 20 mg by mouth at bedtime.), Disp: 30 tablet, Rfl: 1   fluticasone (FLONASE) 50 MCG/ACT nasal spray, USE 2 SPRAYS IN EACH NOSTRIL DAILY (Patient taking differently: Place 2 sprays into both nostrils daily.), Disp: 16 g, Rfl: 2   folic acid (FOLVITE) 1 MG tablet, Take 1 tablet (1 mg total) by mouth daily., Disp: 90 tablet, Rfl: 3   furosemide (LASIX) 40 MG tablet, DAILY AS NEEDED FOR WEIGHT GAIN OF 2 POUNDS OVERNIGHT OR 5 POUNDS IN ONE WEEK, Disp: 90 tablet, Rfl: 2   gabapentin (NEURONTIN) 300 MG capsule, Take 1 capsule (300 mg total) by mouth at bedtime., Disp: , Rfl:    GOODSENSE ARTHRITIS PAIN 1 % GEL, Apply 1 Application topically 4 times daily as needed for pain.,  Disp: 100 g, Rfl: 3   guaiFENesin (MUCINEX) 600 MG 12 hr tablet, Take 1 tablet (600 mg total) by mouth 2 (two) times daily as needed for cough or to loosen phlegm. (Patient taking differently: Take 600 mg by mouth 2 (two) times daily.), Disp: 60 tablet, Rfl: 1   HIZENTRA 1 GM/5ML SOLN, 13 g by Subcutaneous Infusion route See admin instructions. On hold while at rehab. Hizentra- 13 g infused every Friday  ("3 times/5 ml = 65 ml total once weekly"), Disp: , Rfl:    ipratropium-albuterol (DUONEB) 0.5-2.5 (3) MG/3ML SOLN, Take 3 mLs by nebulization every 6 (six) hours as needed., Disp: 360 mL, Rfl: 6   isosorbide-hydrALAZINE (BIDIL) 20-37.5 MG tablet, Take 1 tablet by mouth 2 (two) times daily., Disp: 180 tablet, Rfl: 3   Lactase (LACTOSE FAST ACTING RELIEF PO), Take 3 tablets by mouth 3 (three) times daily before meals., Disp: , Rfl:    lidocaine-prilocaine (EMLA) cream, Apply 1 application  topically as needed (pain)., Disp: , Rfl:    magnesium oxide (MAG-OX) 400 (240 Mg) MG tablet, Take  400 mg by mouth at bedtime., Disp: , Rfl:    Menthol, Topical Analgesic, (BIOFREEZE) 4 % GEL, Apply 1 application  topically daily as needed (for shoulder or joint pain)., Disp: , Rfl:    mirtazapine (REMERON SOL-TAB) 30 MG disintegrating tablet, Take 1 tablet (30 mg total) by mouth at bedtime as needed (sleep). (Patient taking differently: Take 30 mg by mouth at bedtime.), Disp: 90 tablet, Rfl: 3   montelukast (SINGULAIR) 10 MG tablet, Take 1 tablet (10 mg total) by mouth daily. (Patient taking differently: Take 10 mg by mouth daily after supper.), Disp: 90 tablet, Rfl: 1   Nystatin (GERHARDT'S BUTT CREAM) CREA, Apply topically 3 times a day as needed for itching or irritation, Disp: 4 each, Rfl: 6   nystatin (MYCOSTATIN) 100000 UNIT/ML suspension, Use as directed 15 mLs in the mouth or throat at bedtime., Disp: , Rfl:    potassium chloride (KLOR-CON M) 10 MEQ tablet, DAILY AS NEEDED WHEN TAKING LASIX, Disp: 90 tablet,  Rfl: 1   potassium chloride (KLOR-CON) 10 MEQ tablet, Take 10 mEq by mouth daily as needed., Disp: , Rfl:    predniSONE (DELTASONE) 10 MG tablet, Take 4 tablets (40 mg total) by mouth daily with breakfast for 3 days, THEN 3 tablets (30 mg total) daily with breakfast for 3 days, THEN 2 tablets (20 mg total) daily with breakfast for 3 days, THEN 1 tablet (10 mg total) daily with breakfast for 3 days., Disp: 30 tablet, Rfl: 0   Prenatal Vit-Fe Fumarate-FA (PRENATAL VITAMINS PO), Take 1 tablet by mouth daily., Disp: , Rfl:    Probiotic Product (ALIGN PO), Take 1 capsule by mouth daily., Disp: , Rfl:    Propylene Glycol (SYSTANE COMPLETE) 0.6 % SOLN, Place 1 drop into both eyes 2 (two) times daily., Disp: 15 mL, Rfl: 5   sacubitril-valsartan (ENTRESTO) 24-26 MG, Take 1 tablet by mouth 2 (two) times daily., Disp: 180 tablet, Rfl: 1   traMADol (ULTRAM) 50 MG tablet, Take 1 tablet (50 mg total) by mouth at bedtime., Disp: 10 tablet, Rfl: 0   Triamcinolone Acetonide (TRIAMCINOLONE 0.1 % CREAM : EUCERIN) CREA, Apply 1 Application topically 2 (two) times daily., Disp: 1 each, Rfl: 1   triamcinolone ointment (KENALOG) 0.5 %, Apply 1 Application topically 2 (two) times daily., Disp: 30 g, Rfl: 1   XARELTO 20 MG TABS tablet, TAKE 1 TABLET BY MOUTH DAILY WITH SUPPER (Patient taking differently: Take 20 mg by mouth daily with supper.), Disp: 90 tablet, Rfl: 2   Tiotropium Bromide-Olodaterol (STIOLTO RESPIMAT) 2.5-2.5 MCG/ACT AERS, Inhale 2 puffs into the lungs daily., Disp: 4 g, Rfl: 11 No current facility-administered medications for this visit.  Facility-Administered Medications Ordered in Other Visits:    iohexol (OMNIPAQUE) 350 MG/ML injection, , , PRN, Marykay Lex, MD, 105 mL at 01/11/21 [phone]

## 2022-07-20 NOTE — Patient Instructions (Addendum)
Recommend Airway Clearance Regimen: - Use budesonide nebulizer treatments twice daily for 2-4 weeks - Use chest vest twice daily after nebulizer treatments, can stop after 2-4 weeks if mucous gone - Use flutter valve twice daily after chest vest and nebulizer treatments  Continue stiolto 2 puffs daily  Start prednisone taper  Follow up in 4 months, call sooner if needed

## 2022-07-22 ENCOUNTER — Encounter: Payer: Self-pay | Admitting: Pulmonary Disease

## 2022-07-24 ENCOUNTER — Encounter: Payer: Self-pay | Admitting: Family Medicine

## 2022-07-24 ENCOUNTER — Other Ambulatory Visit: Payer: Self-pay

## 2022-07-24 ENCOUNTER — Telehealth: Payer: Self-pay | Admitting: Family Medicine

## 2022-07-24 ENCOUNTER — Ambulatory Visit: Payer: Medicare PPO | Admitting: Family Medicine

## 2022-07-24 ENCOUNTER — Telehealth: Payer: Self-pay | Admitting: Pulmonary Disease

## 2022-07-24 VITALS — BP 128/50 | HR 75 | Temp 98.2°F | Resp 16 | Wt 131.7 lb

## 2022-07-24 DIAGNOSIS — J479 Bronchiectasis, uncomplicated: Secondary | ICD-10-CM

## 2022-07-24 DIAGNOSIS — R898 Other abnormal findings in specimens from other organs, systems and tissues: Secondary | ICD-10-CM

## 2022-07-24 DIAGNOSIS — J3489 Other specified disorders of nose and nasal sinuses: Secondary | ICD-10-CM | POA: Diagnosis not present

## 2022-07-24 DIAGNOSIS — G478 Other sleep disorders: Secondary | ICD-10-CM

## 2022-07-24 DIAGNOSIS — D801 Nonfamilial hypogammaglobulinemia: Secondary | ICD-10-CM | POA: Diagnosis not present

## 2022-07-24 DIAGNOSIS — J4551 Severe persistent asthma with (acute) exacerbation: Secondary | ICD-10-CM

## 2022-07-24 DIAGNOSIS — K219 Gastro-esophageal reflux disease without esophagitis: Secondary | ICD-10-CM

## 2022-07-24 MED ORDER — MUPIROCIN 2 % EX OINT: 1.0000 | TOPICAL_OINTMENT | Freq: Two times a day (BID) | CUTANEOUS | 0 refills | Status: AC

## 2022-07-24 NOTE — Telephone Encounter (Signed)
Patient states she needs a refill for Mupirocin 2% to go to [facility]

## 2022-07-24 NOTE — Progress Notes (Incomplete)
[address] [place] [address] Dept: [phone]  FOLLOW UP NOTE  Patient ID: Ebony Scott, female    DOB: [birth year]  Age: 72 y.o. MRN: [id number] Date of Office Visit: 07/24/2022  Assessment  Chief Complaint: Follow-up and Medication Refill (Mupirocin ointment usp 2%)  HPI Ebony Scott    Drug Allergies:  Allergies  Allergen Reactions  . Baclofen Anaphylaxis and Other (See Comments)    Altered mental status requiring a 3-day hospital stay- patient became unresponsive   "Put me in a coma for five days", Altered mental status requiring a 3-day hospital stay- patient became unresponsive  . Dust Mite Extract Shortness Of Breath and Other (See Comments)    "sneezing" (02/20/2012) too  . Molds & Smuts Shortness Of Breath  . Morphine And Codeine Hives and Itching  . Other Shortness Of Breath and Other (See Comments)    Grass and weeds "sneezing; filled sinuses" (02/20/2012)  . Penicillins Rash and Other (See Comments)    "welts" (02/20/2012)  Tolerated Ancef 02/16/20  . Rofecoxib Swelling and Other (See Comments)    Vioxx- feet swelling  . Shellfish Allergy Anaphylaxis, Shortness Of Breath, Itching and Rash  . Shrimp Extract Anaphylaxis and Other (See Comments)    ALL SHELLFISH  . Tetracycline Hcl Nausea And Vomiting  . Tetracycline Hcl Other (See Comments)    GI Intolerance  . Xolair [Omalizumab] Other (See Comments)    Caused Blood clot  . Zoledronic Acid Other (See Comments)    Reclast- Fever, Put in hospital, dr said it was a reaction from a reaction    Fever, inability to walk, Put in hospital  Fever, Put in hospital, dr said it was a reaction from a reaction , Other reaction(s): Unknown  . Dilaudid [Hydromorphone Hcl] Itching  . Hydrocodone-Acetaminophen Itching  . Levofloxacin Other (See Comments)    GI upset  Other Reaction(s): GI Intolerance, Other (See Comments)  REACTION: GI upset, GI upset  . Oxycodone Hcl Nausea And Vomiting  . Oxycodone Hcl Other  (See Comments)    GI Intolerance  . Paroxetine Nausea And Vomiting and Other (See Comments)    GI Intolerance also  . Celecoxib Swelling and Other (See Comments)    Feet swelling  . Diltiazem Hcl Swelling  . Lactose Other (See Comments)    Bloating and gas  . Lactose Intolerance (Gi) Other (See Comments)    Bloating and gas  . Oxycodone-Acetaminophen Itching  . Rituximab Other (See Comments)    Anemia   . Tree Extract Other (See Comments)    "tested and told I was allergic to it; never experienced a reaction to it" (02/20/2012)  . Gamunex [Immune Globulin] Itching and Rash    Tolerates hizentra   . Penicillin G Procaine Rash and Other (See Comments)    Welts, also    Physical Exam: BP (!) 128/50   Pulse 75   Temp 98.2 F (36.8 C) (Temporal)   Resp 16   Wt 131 lb 11.2 oz (59.7 kg)   SpO2 95%   BMI 24.88 kg/m    Physical Exam  Diagnostics:    Assessment and Plan: No diagnosis found.  No orders of the defined types were placed in this encounter.   There are no Patient Instructions on file for this visit.  No follow-ups on file.    Thank you for the opportunity to care for this patient.  Please do not hesitate to contact me with questions.  Thermon Leyland, FNP  [facility]

## 2022-07-24 NOTE — Telephone Encounter (Signed)
Patient states needs new Nebulizer machine. Patient uses [facility]. Patient phone number is [phone].

## 2022-07-24 NOTE — Patient Instructions (Addendum)
  1. 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 Periactin 4 mg tablet  2 tablets at bedtime  2. Continue to Treat laryngopharyngeal reflux:    A. Dexilant 60 mg in the morning  B. famotidine 40 mg daily  3.  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  Continue immunoglobulin infusions solution  4.  Use Astelin and Zyrtec and nasal saline and topical steroid if needed  5.  Continue therapy directed by pulmonology and rheumatology  6.  Submit for insurance approval of Respitech percussion vest to treat bronchiectasis/phlegm production  7.  Blood - IgA/G/M and CBC  8.  Return to clinic in 3 months or earlier if problem

## 2022-07-25 ENCOUNTER — Telehealth: Payer: Self-pay

## 2022-07-25 ENCOUNTER — Other Ambulatory Visit (HOSPITAL_COMMUNITY): Payer: Self-pay

## 2022-07-25 ENCOUNTER — Encounter: Payer: Self-pay | Admitting: Family Medicine

## 2022-07-25 LAB — CBC WITH DIFFERENTIAL
Basophils Absolute: 0.1 10*3/uL (ref 0.0–0.2)
Basos: 1 %
EOS (ABSOLUTE): 0.1 10*3/uL (ref 0.0–0.4)
Eos: 2 %
Hematocrit: 32.2 % — ABNORMAL LOW (ref 34.0–46.6)
Hemoglobin: 10.5 g/dL — ABNORMAL LOW (ref 11.1–15.9)
Immature Grans (Abs): 0 10*3/uL (ref 0.0–0.1)
Immature Granulocytes: 0 %
Lymphocytes Absolute: 0.7 10*3/uL (ref 0.7–3.1)
Lymphs: 8 %
MCH: 35.1 pg — ABNORMAL HIGH (ref 26.6–33.0)
MCHC: 32.6 g/dL (ref 31.5–35.7)
MCV: 108 fL — ABNORMAL HIGH (ref 79–97)
Monocytes Absolute: 0.7 10*3/uL (ref 0.1–0.9)
Monocytes: 8 %
Neutrophils Absolute: 7.3 10*3/uL — ABNORMAL HIGH (ref 1.4–7.0)
Neutrophils: 81 %
RBC: 2.99 x10E6/uL — ABNORMAL LOW (ref 3.77–5.28)
RDW: 14 % (ref 11.7–15.4)
WBC: 9 10*3/uL (ref 3.4–10.8)

## 2022-07-25 LAB — IGG, IGA, IGM
IgA/Immunoglobulin A, Serum: 94 mg/dL (ref 64–422)
IgG (Immunoglobin G), Serum: 1030 mg/dL (ref 586–1602)
IgM (Immunoglobulin M), Srm: 46 mg/dL (ref 26–217)

## 2022-07-25 NOTE — Telephone Encounter (Signed)
Spoke with patient. States nebulizer machine is broken. She is requesting an order for a new machine. Order has been placed today. nfn

## 2022-07-25 NOTE — Progress Notes (Signed)
[address] [place] [address] Dept: [phone]  FOLLOW UP NOTE  Patient ID: Ebony Scott, female    DOB: [birth year]  Age: 72 y.o. MRN: [id number] Date of Office Visit: 07/24/2022  Assessment  Chief Complaint: Follow-up and Medication Refill (Mupirocin ointment usp 2%)  HPI Ebony Scott is a 72 year old female who presents to the clinic for follow-up visit.  She was last seen in this clinic on 06/13/2021 by Dr. Lucie Leather for evaluation of hypoagammaglobulinemia, facial pain syndrome, allergic rhinitis, sleep dysfunction, and reflux.  She is accompanied by a caregiver who assists with some history.  At today's visit, she reports her allergic rhinitis has been moderately well-controlled with symptoms including occasional clear rhinorrhea, sneezing, and postnasal drainage.  She continues cetirizine 10 mg once a day and is not currently using azelastine.   Reflux is reported as moderately well-controlled with hiccups after a meal as the main symptom.  She continues Dexilant and famotidine with relief of symptoms.  Sleep dysfunction is reported as moderately well-controlled with Periactin. She denies headaches and facial pain.  She continues to use mupirocin for dry scabby areas in her nostrils. She reports this also prevents her nostrils from bleeding.  She continues Hizentra 13 gram subcutaneous infusions weekly without any issues. She has recently azithromycin for bronchitis. Hospital admission on 04/25/2022 for pneumonia noted on CT scan.  She continues to follow up with Dr. Francine Graven, pulmonary specialist for treatment of tracheobronchial malacia and bronchiectasis. She continues to use a flutter valve and vest therapy. She continues systemic steroids daily and is followed by Dr. Dimple Casey, rheumatology specialist.  Her current medications are listed in the chart.  Drug Allergies:  Allergies  Allergen Reactions   Baclofen Anaphylaxis and Other (See Comments)    Altered mental status  requiring a 3-day hospital stay- patient became unresponsive   "Put me in a coma for five days", Altered mental status requiring a 3-day hospital stay- patient became unresponsive   Dust Mite Extract Shortness Of Breath and Other (See Comments)    "sneezing" (02/20/2012) too   Molds & Smuts Shortness Of Breath   Morphine And Codeine Hives and Itching   Other Shortness Of Breath and Other (See Comments)    Grass and weeds "sneezing; filled sinuses" (02/20/2012)   Penicillins Rash and Other (See Comments)    "welts" (02/20/2012)  Tolerated Ancef 02/16/20   Rofecoxib Swelling and Other (See Comments)    Vioxx- feet swelling   Shellfish Allergy Anaphylaxis, Shortness Of Breath, Itching and Rash   Shrimp Extract Anaphylaxis and Other (See Comments)    ALL SHELLFISH   Tetracycline Hcl Nausea And Vomiting   Tetracycline Hcl Other (See Comments)    GI Intolerance   Xolair [Omalizumab] Other (See Comments)    Caused Blood clot   Zoledronic Acid Other (See Comments)    Reclast- Fever, Put in hospital, dr said it was a reaction from a reaction    Fever, inability to walk, Put in hospital  Fever, Put in hospital, dr said it was a reaction from a reaction , Other reaction(s): Unknown   Dilaudid [Hydromorphone Hcl] Itching   Hydrocodone-Acetaminophen Itching   Levofloxacin Other (See Comments)    GI upset  Other Reaction(s): GI Intolerance, Other (See Comments)  REACTION: GI upset, GI upset   Oxycodone Hcl Nausea And Vomiting   Oxycodone Hcl Other (See Comments)    GI Intolerance   Paroxetine Nausea And Vomiting and Other (See Comments)    GI  Intolerance also   Celecoxib Swelling and Other (See Comments)    Feet swelling   Diltiazem Hcl Swelling   Lactose Other (See Comments)    Bloating and gas   Lactose Intolerance (Gi) Other (See Comments)    Bloating and gas   Oxycodone-Acetaminophen Itching   Rituximab Other (See Comments)    Anemia    Tree Extract Other (See Comments)     "tested and told I was allergic to it; never experienced a reaction to it" (02/20/2012)   Gamunex [Immune Globulin] Itching and Rash    Tolerates hizentra    Penicillin G Procaine Rash and Other (See Comments)    Welts, also    Physical Exam: BP (!) 128/50   Pulse 75   Temp 98.2 F (36.8 C) (Temporal)   Resp 16   Wt 131 lb 11.2 oz (59.7 kg)   SpO2 95%   BMI 24.88 kg/m    Physical Exam Vitals reviewed.  Constitutional:      Appearance: Normal appearance.  HENT:     Head: Normocephalic and atraumatic.     Right Ear: Tympanic membrane normal.     Left Ear: Tympanic membrane normal.     Nose:     Comments: Bilateral nares slightly erythematous with thin clear nasal drainage noted. Pharynx normal. Ears normal. Eyes normal.    Mouth/Throat:     Pharynx: Oropharynx is clear.  Eyes:     Conjunctiva/sclera: Conjunctivae normal.  Cardiovascular:     Rate and Rhythm: Normal rate and regular rhythm.     Heart sounds: Normal heart sounds. No murmur heard. Pulmonary:     Effort: Pulmonary effort is normal.     Breath sounds: Normal breath sounds.     Comments: Lungs clear to auscultation Musculoskeletal:        General: Normal range of motion.     Cervical back: Normal range of motion and neck supple.  Skin:    General: Skin is warm.  Neurological:     Mental Status: She is alert and oriented to person, place, and time.  Psychiatric:        Mood and Affect: Mood normal.        Behavior: Behavior normal.        Thought Content: Thought content normal.        Judgment: Judgment normal.     Assessment and Plan: 1. Hypogammaglobulinemia (HCC)   2. Eosinophils increased   3. Sleep dysfunction with sleep stage disturbance   4. LPRD (laryngopharyngeal reflux disease)   5. Nasal vestibulitis     Meds ordered this encounter  Medications   mupirocin ointment (BACTROBAN) 2 %    Sig: Apply 1 Application topically 2 (two) times daily for 7 days.    Dispense:  22 g    Refill:  0     Patient Instructions   1. 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 Periactin 4 mg tablet  2 tablets at bedtime  2. Continue to Treat laryngopharyngeal reflux:    A. Dexilant 60 mg in the morning  B. famotidine 40 mg daily  3.  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  Continue immunoglobulin infusions solution  4.  Use Astelin and Zyrtec and nasal saline and topical steroid if needed  5.  Continue therapy directed by pulmonology and rheumatology  6.  Continue percussion vest to treat bronchiectasis/phlegm production  7.  Blood - IgA/G/M and CBC  8.  Return to clinic  in 3 months or earlier if problem     Return in about 3 months (around 10/24/2022), or if symptoms worsen or fail to improve.    Thank you for the opportunity to care for this patient.  Please do not hesitate to contact me with questions.  Thermon Leyland, FNP [facility]

## 2022-07-25 NOTE — Telephone Encounter (Signed)
Patient Advocate Encounter   Received notification from [facility] that prior authorization is required for Mupirocin 2% ointment   Submitted: n/a [facility] [id number]  PA not submitted at this time, awaiting response from office.

## 2022-07-26 ENCOUNTER — Other Ambulatory Visit: Payer: Self-pay | Admitting: Allergy and Immunology

## 2022-07-26 DIAGNOSIS — N958 Other specified menopausal and perimenopausal disorders: Secondary | ICD-10-CM | POA: Diagnosis not present

## 2022-07-26 DIAGNOSIS — J4551 Severe persistent asthma with (acute) exacerbation: Secondary | ICD-10-CM | POA: Diagnosis not present

## 2022-07-26 LAB — HM DEXA SCAN

## 2022-07-26 NOTE — Telephone Encounter (Signed)
Mupirocin DX: nasal vestibulitis please

## 2022-07-27 ENCOUNTER — Other Ambulatory Visit (HOSPITAL_COMMUNITY): Payer: Self-pay

## 2022-07-27 NOTE — Telephone Encounter (Signed)
PA not needed as this medication is covered. Current test claim show $0.00 co-pay

## 2022-07-28 DIAGNOSIS — M25512 Pain in left shoulder: Secondary | ICD-10-CM | POA: Diagnosis not present

## 2022-07-30 ENCOUNTER — Telehealth: Payer: Self-pay | Admitting: Cardiology

## 2022-07-30 ENCOUNTER — Other Ambulatory Visit: Payer: Self-pay

## 2022-07-30 ENCOUNTER — Telehealth: Payer: Self-pay

## 2022-07-30 ENCOUNTER — Other Ambulatory Visit: Payer: Self-pay | Admitting: *Deleted

## 2022-07-30 DIAGNOSIS — J4550 Severe persistent asthma, uncomplicated: Secondary | ICD-10-CM

## 2022-07-30 IMAGING — MR MR SHOULDER*R* W/O CM
4 of 5 series · 25 of 40 positions shown · non-contrast
Comparison: None.

CLINICAL DATA: Right shoulder pain for several weeks. History of a
fall in February 2019

EXAM:
MRI OF THE RIGHT SHOULDER WITHOUT CONTRAST
TECHNIQUE: Multiplanar, multisequence MR imaging of the shoulder was performed.
No intravenous contrast was administered.

[Series 3: T2 fat-sat · axial · 4.0mm · 0.27mm/px · z∈[-50,+91]mm · 9 of 30 slices shown (1 of 3)]
[im 1/30]
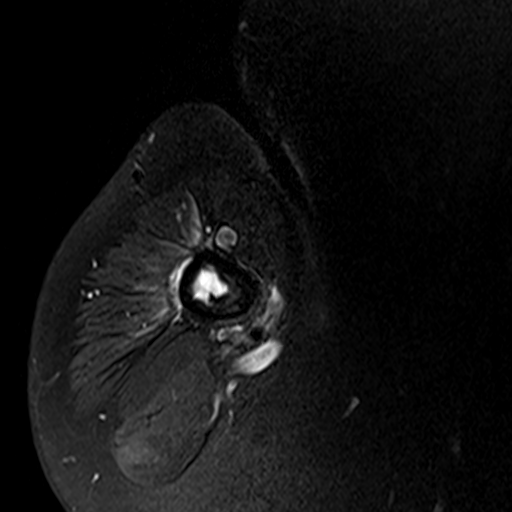
[im 6/30]
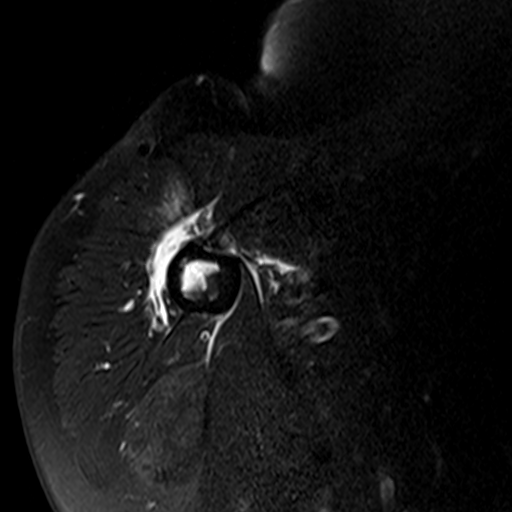
[im 8/30]
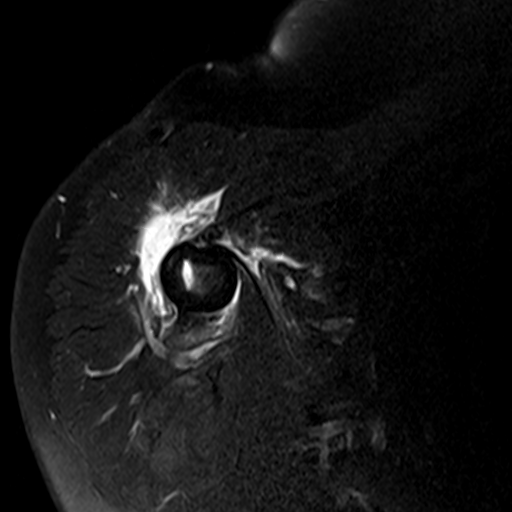
[im 14/30]
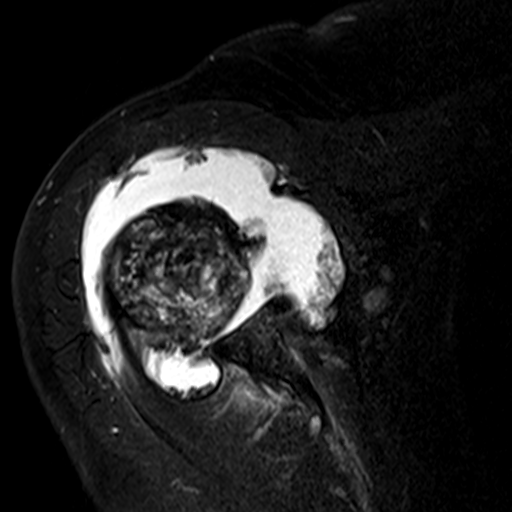
[im 16/30]
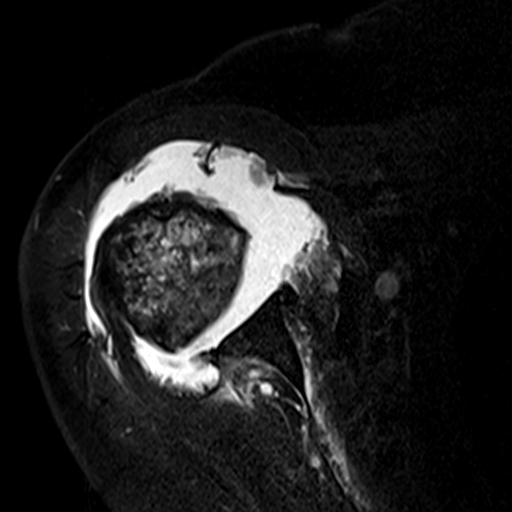
[im 22/30]
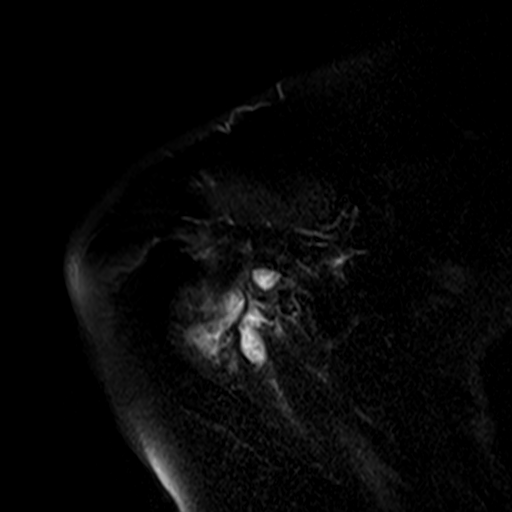
[im 24/30]
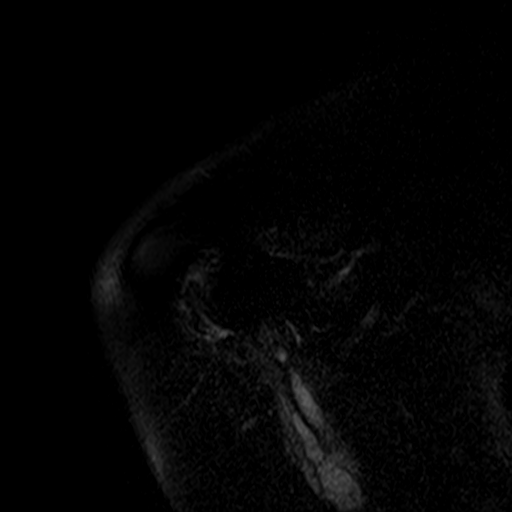
[im 27/30]
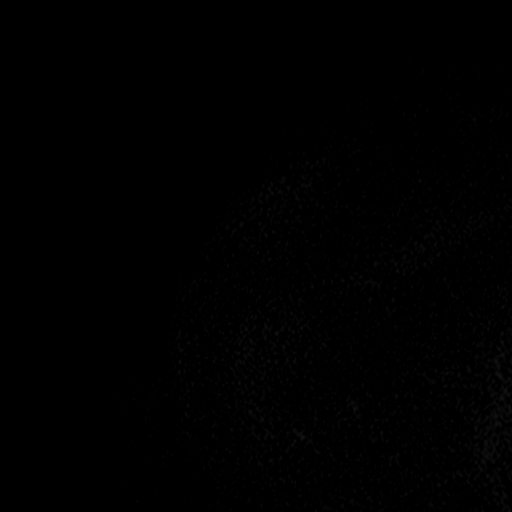
[im 30/30]
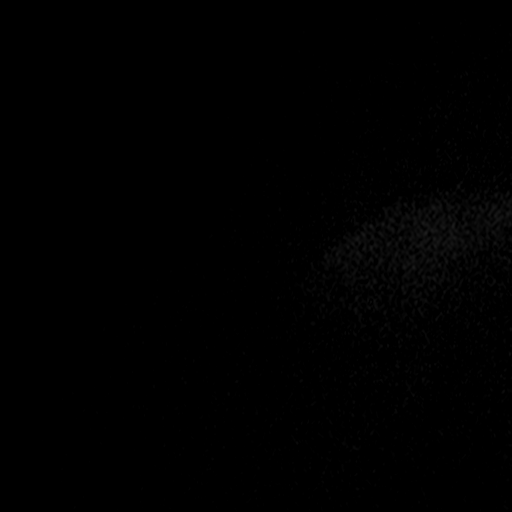

[Series 4: T2 fat-sat · oblique · 4.0mm · 0.55mm/px · 6 of 18 slices shown (2 of 3)]
[im 1/18]
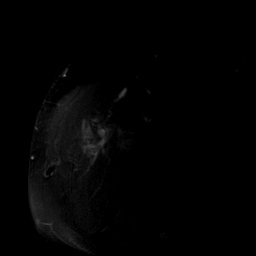
[im 3/18]
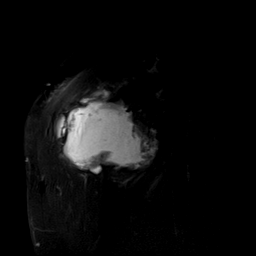
[im 6/18]
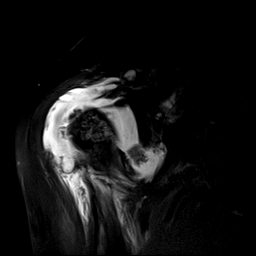
[im 9/18]
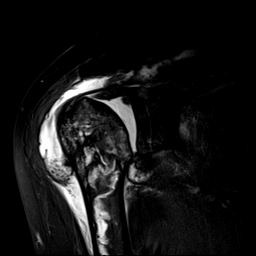
[im 12/18]
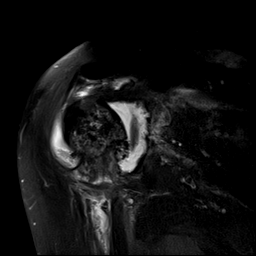
[im 15/18]
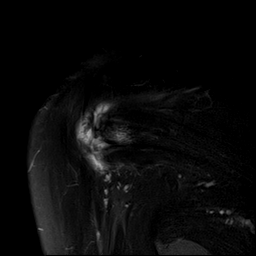

[Series 5: PD · oblique · 4.0mm · 0.27mm/px · 7 of 18 slices shown]
[im 1/18]
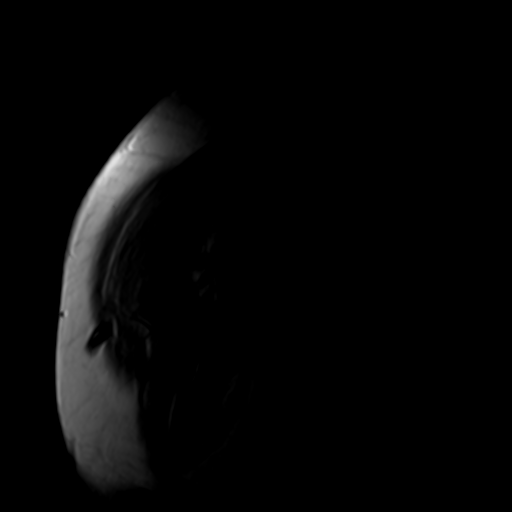
[im 3/18]
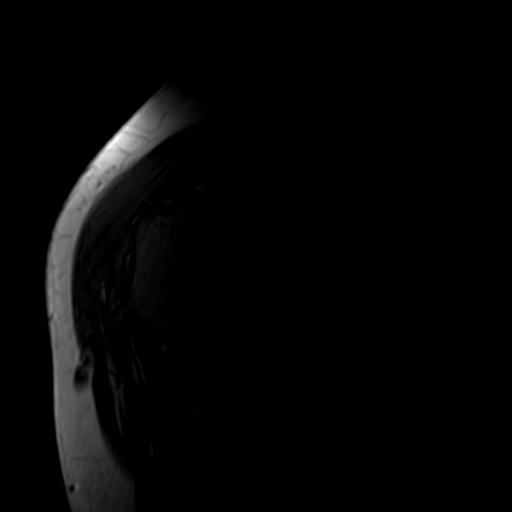
[im 6/18]
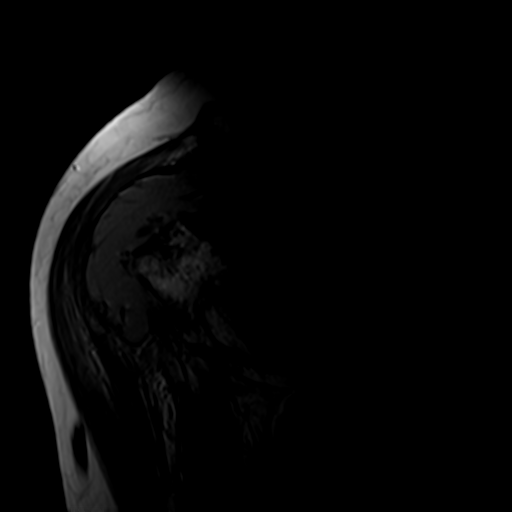
[im 9/18]
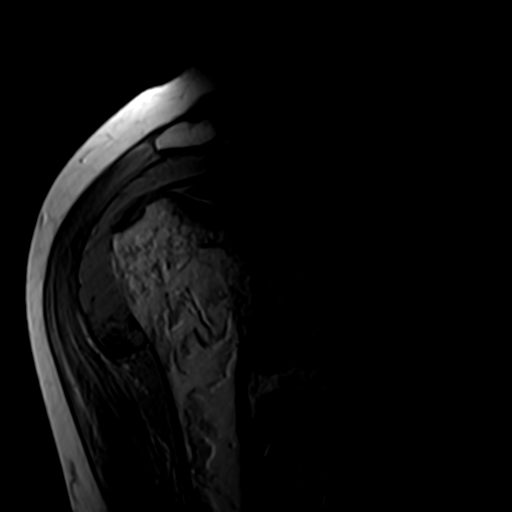
[im 12/18]
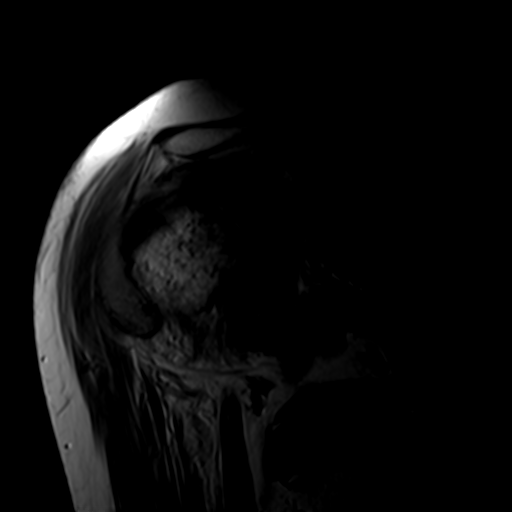
[im 15/18]
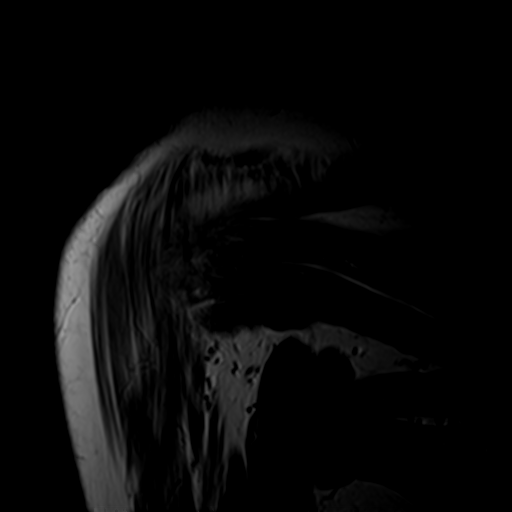
[im 18/18]
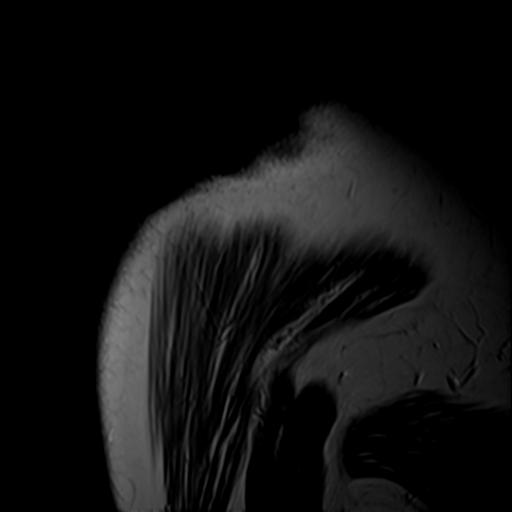

[Series 6: T2 fat-sat · oblique · 4.0mm · 0.55mm/px · 3 of 19 slices shown (3 of 3)]
[im 4/19]
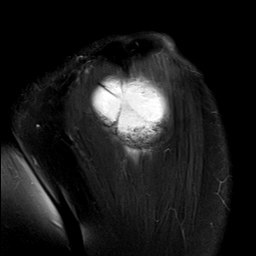
[im 10/19]
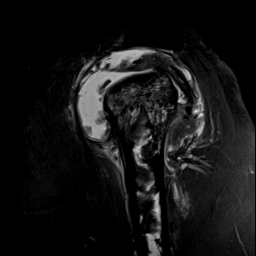
[im 16/19]
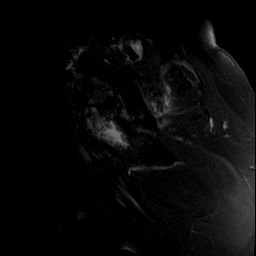

[25 of 40 positions shown; findings below may reference images not displayed]

FINDINGS: Rotator cuff: Large full-thickness retracted rotator cuff tear. Most
of the supraspinatus tendon is completely torn and retracted
approximately 2.3 cm. There are few anterior fibers are still
intact. The subscapularis tendon is intact but demonstrates moderate
tendinopathy. The subscapularis tendon is completely torn and
retracted.

Muscles: Marked fatty atrophy of the subscapularis muscle. The
supraspinatus and infraspinatus muscles demonstrate mild fatty
change.

Biceps long head:  Not identified.  Likely torn and retracted.

Acromioclavicular Joint: Moderate degenerative changes. Type 2
acromion. No lateral downsloping or undersurface spurring.

Glenohumeral Joint: Severe degenerative changes. The humeral head is
flattened and there is full-thickness cartilage loss involving the
humeral head and glenoid. There is a large joint effusion and
moderate synovitis noted also.

Labrum: Degenerated and torn anterior and posterior labrum. The
superior labrum appears intact.

Bones: Very heterogeneous marrow signal in the humerus. Some of this
could be related to osteoporosis and there also are probable
medullary bone infarcts.

Other: Large amount of expected fluid in the subacromial/subdeltoid
bursa.
IMPRESSION: 1. Large full-thickness retracted rotator cuff tear as described
above.
2. Marked fatty atrophy of the subscapularis muscle.
3. Likely torn and retracted long head biceps tendon.
4. Severe glenohumeral joint degenerative changes.
5. Degenerated and torn anterior and posterior labrum.
6. Very heterogeneous marrow signal in the humerus. Some of this
could be related to osteoporosis and there also are probable
medullary bone infarcts.

## 2022-07-30 MED ORDER — MONTELUKAST SODIUM 10 MG PO TABS
10.0000 mg | ORAL_TABLET | Freq: Every day | ORAL | 1 refills | Status: DC
Start: 2022-07-30 — End: 2023-02-07

## 2022-07-30 MED ORDER — PREDNISONE 1 MG PO TABS
2.0000 mg | ORAL_TABLET | Freq: Every day | ORAL | 0 refills | Status: DC
Start: 2022-07-30 — End: 2022-11-01

## 2022-07-30 MED ORDER — PREDNISONE 5 MG PO TABS: 5.0000 mg | ORAL_TABLET | Freq: Every day | ORAL | 0 refills | Status: DC

## 2022-07-30 NOTE — Telephone Encounter (Signed)
FYI - Patient is currently taking a prednisone taper prescribed by Dr. Francine Graven 07/20/2022.  Last Fill: 02/14/2022  Next Visit: 09/10/2022  Last Visit: 05/22/2022  Dx:  Rheumatoid arthritis with negative rheumatoid factor, involving unspecified site   Current Dose per office note on 05/22/2022: prednisone 7 mg daily   Okay to refill Prednisone?

## 2022-07-30 NOTE — Telephone Encounter (Signed)
Pt called in to request a refill:  Prescription Request  07/30/2022  LOV: 05/29/22  What is the name of the medication or equipment?predniSONE (DELTASONE) 10 MG tablet [[id number]]  Have you contacted your pharmacy to request a refill? No   Which pharmacy would you like this sent to?  [facility] - [place], [place] - [address] [place] [address] Phone: [phone] Fax: [phone]    Patient notified that their request is being sent to the clinical staff for review and that they should receive a response within 2 business days.   Please advise at [facility] [phone]  Prescription Request  07/30/2022  LOV: 05/29/22  What is the name of the medication or equipment? montelukast (SINGULAIR) 10 MG tablet [[id number]]  Have you contacted your pharmacy to request a refill? No   Which pharmacy would you like this sent to?  [facility] - [place], [place] - [address] [place] [address] Phone: [phone] Fax: [phone]    Patient notified that their request is being sent to the clinical staff for review and that they should receive a response within 2 business days.   Please advise at [facility] [phone]

## 2022-07-30 NOTE — Telephone Encounter (Signed)
*  STAT* If patient is at the pharmacy, call can be transferred to refill team.   1. Which medications need to be refilled? (please list name of each medication and dose if known)   sacubitril-valsartan (ENTRESTO) 24-26 MG    2. Which pharmacy/location (including street and city if local pharmacy) is medication to be sent to?  [facility] - [place], [place] - [address]    3. Do they need a 30 day or 90 day supply? 90

## 2022-07-30 NOTE — Telephone Encounter (Signed)
Medication was already refilled on 07/18/22.

## 2022-08-01 NOTE — Telephone Encounter (Signed)
Tried calling the patient to follow up on this issue. No one answered the phone and no voice message could be left because the mailbox has not been set up.

## 2022-08-02 ENCOUNTER — Telehealth: Payer: Self-pay

## 2022-08-02 NOTE — Telephone Encounter (Signed)
Pt called in to ask nurse/pcp since she has had her bone density test pt is wanting to know if pcp still wants her to continue to get Prolia injections. Please advise.  Cb#: [phone]

## 2022-08-03 ENCOUNTER — Other Ambulatory Visit: Payer: Self-pay

## 2022-08-03 DIAGNOSIS — M810 Age-related osteoporosis without current pathological fracture: Secondary | ICD-10-CM

## 2022-08-03 DIAGNOSIS — E0500 Thyrotoxicosis with diffuse goiter without thyrotoxic crisis or storm: Secondary | ICD-10-CM

## 2022-08-03 DIAGNOSIS — E242 Drug-induced Cushing's syndrome: Secondary | ICD-10-CM

## 2022-08-03 NOTE — Progress Notes (Signed)
Can you please let this patient know that her immunoglobulins are within normal limits and her complete blood count is stable. Thank you

## 2022-08-06 ENCOUNTER — Other Ambulatory Visit: Payer: Self-pay | Admitting: Family Medicine

## 2022-08-06 ENCOUNTER — Telehealth: Payer: Self-pay

## 2022-08-06 ENCOUNTER — Other Ambulatory Visit: Payer: Self-pay | Admitting: Orthopedic Surgery

## 2022-08-06 DIAGNOSIS — Z01818 Encounter for other preprocedural examination: Secondary | ICD-10-CM

## 2022-08-06 NOTE — Telephone Encounter (Signed)
Prescription Request  08/06/2022  LOV: 05/29/2022  What is the name of the medication or equipment? magnesium oxide (MAG-OX) 400 (240 Mg) MG tablet   Have you contacted your pharmacy to request a refill? Yes   Which pharmacy would you like this sent to?  [facility] - [place], [place] - [address] [place] [address] Phone: [phone] Fax: [phone]    Patient notified that their request is being sent to the clinical staff for review and that they should receive a response within 2 business days.   Please advise at [facility] [phone]

## 2022-08-06 NOTE — Telephone Encounter (Signed)
Prescription Request  08/06/2022  LOV: 07/19/22  What is the name of the medication or equipment? HYDROXYCHLOROQUINE SULFATE 200  Have you contacted your pharmacy to request a refill? Yes   Which pharmacy would you like this sent to?  [facility] - [place], [place] - [address] [place] [address] Phone: [phone] Fax: [phone]    Patient notified that their request is being sent to the clinical staff for review and that they should receive a response within 2 business days.   Please advise at [facility] [phone]  Prescription Request  08/06/2022  LOV: 07/19/22  What is the name of the medication or equipment? famotidine (PEPCID) 20 MG tablet [[id number]]  Have you contacted your pharmacy to request a refill? Yes   Which pharmacy would you like this sent to?  [facility] - [place], [place] - [address] [place] [address] Phone: [phone] Fax: [phone]    Patient notified that their request is being sent to the clinical staff for review and that they should receive a response within 2 business days.   Please advise at [facility] [phone]

## 2022-08-06 NOTE — Telephone Encounter (Signed)
Requested medication (s) are due for refill today: Amount not specified  Requested medication (s) are on the active medication list: yes    Last refill: 08/17/21  ?amount  Future visit scheduled no  Notes to clinic:Historical provider, please review. Thank you.  Requested Prescriptions  Pending Prescriptions Disp Refills   magnesium oxide (MAG-OX) 400 (240 Mg) MG tablet      Sig: Take 1 tablet (400 mg total) by mouth at bedtime.     Endocrinology:  Minerals - Magnesium Supplementation Failed - 08/06/2022 10:09 AM      Failed - Cr in normal range and within 360 days    Creatinine  Date Value Ref Range Status  06/14/2022 1.14 (H) 0.44 - 1.00 mg/dL Final   Creat  Date Value Ref Range Status  05/29/2022 1.38 (H) 0.60 - 1.00 mg/dL Final   Creatinine, Urine  Date Value Ref Range Status  05/31/2014 17.81 (L) >20.0 mg/dL Final         Failed - Valid encounter within last 12 months    Recent Outpatient Visits           1 year ago Chronic fatigue   [facility] [name], [name], MD   1 year ago Anemia, unspecified type   [facility] [name], [name], MD   1 year ago Anemia, unspecified type   [facility] [name], MD   1 year ago Type 2 diabetes mellitus with other specified complication, unspecified whether long term insulin use (HCC)   [facility] [name], MD   1 year ago Chronic combined systolic and diastolic CHF (congestive heart failure) (HCC)   [name] [name], [name], MD       Future Appointments             Tomorrow Alver Sorrow, NP [facility] at [facility] [place]   In 1 month Jodelle Red, MD [facility] at [facility]   In 1 month Rice, [name], MD [facility]umat[facility]   In 2 months Kozlow, [name], MD [facility] at [facility] - Mg Level in normal range and within 360 days    Magnesium  Date Value Ref Range Status  05/02/2022 2.2 1.7 - 2.4 mg/dL Final    Comment:    Performed at [facility], [address] [place], [place] [address]

## 2022-08-07 ENCOUNTER — Other Ambulatory Visit: Payer: Self-pay

## 2022-08-07 ENCOUNTER — Encounter (HOSPITAL_BASED_OUTPATIENT_CLINIC_OR_DEPARTMENT_OTHER): Payer: Self-pay | Admitting: Family

## 2022-08-07 ENCOUNTER — Ambulatory Visit (HOSPITAL_BASED_OUTPATIENT_CLINIC_OR_DEPARTMENT_OTHER): Payer: Medicare PPO | Admitting: Family

## 2022-08-07 ENCOUNTER — Ambulatory Visit: Payer: Medicare PPO | Admitting: Allergy and Immunology

## 2022-08-07 ENCOUNTER — Telehealth: Payer: Self-pay

## 2022-08-07 VITALS — BP 140/64 | HR 82 | Ht 61.0 in | Wt 142.0 lb

## 2022-08-07 DIAGNOSIS — I25118 Atherosclerotic heart disease of native coronary artery with other forms of angina pectoris: Secondary | ICD-10-CM | POA: Diagnosis not present

## 2022-08-07 DIAGNOSIS — R0602 Shortness of breath: Secondary | ICD-10-CM

## 2022-08-07 DIAGNOSIS — E876 Hypokalemia: Secondary | ICD-10-CM

## 2022-08-07 DIAGNOSIS — D801 Nonfamilial hypogammaglobulinemia: Secondary | ICD-10-CM

## 2022-08-07 DIAGNOSIS — Z5181 Encounter for therapeutic drug level monitoring: Secondary | ICD-10-CM

## 2022-08-07 DIAGNOSIS — G478 Other sleep disorders: Secondary | ICD-10-CM

## 2022-08-07 DIAGNOSIS — G500 Trigeminal neuralgia: Secondary | ICD-10-CM | POA: Diagnosis not present

## 2022-08-07 DIAGNOSIS — N1831 Chronic kidney disease, stage 3a: Secondary | ICD-10-CM

## 2022-08-07 DIAGNOSIS — E785 Hyperlipidemia, unspecified: Secondary | ICD-10-CM

## 2022-08-07 DIAGNOSIS — I5020 Unspecified systolic (congestive) heart failure: Secondary | ICD-10-CM

## 2022-08-07 DIAGNOSIS — K219 Gastro-esophageal reflux disease without esophagitis: Secondary | ICD-10-CM

## 2022-08-07 DIAGNOSIS — J4550 Severe persistent asthma, uncomplicated: Secondary | ICD-10-CM | POA: Diagnosis not present

## 2022-08-07 DIAGNOSIS — D509 Iron deficiency anemia, unspecified: Secondary | ICD-10-CM | POA: Diagnosis not present

## 2022-08-07 MED ORDER — DEXLANSOPRAZOLE 60 MG PO CPDR: 60.0000 mg | DELAYED_RELEASE_CAPSULE | Freq: Every day | ORAL | 3 refills | Status: DC

## 2022-08-07 MED ORDER — FAMOTIDINE 20 MG PO TABS: 20.0000 mg | ORAL_TABLET | Freq: Every day | ORAL | 5 refills | Status: DC

## 2022-08-07 NOTE — Patient Instructions (Addendum)
Medication Instructions:  TAKE FUROSEMIDE AND POTASSIUM DAILY FOR 2 TO 3 DAYS AND THEN RESUME AS NEEDED    *If you need a refill on your cardiac medications before your next appointment, please call your pharmacy*  Lab Work: CMET/BNP/DIRECT LDL TODAY   If you have labs (blood work) drawn today and your tests are completely normal, you will receive your results only by: MyChart Message (if you have MyChart) OR A paper copy in the mail If you have any lab test that is abnormal or we need to change your treatment, we will call you to review the results.  Testing/Procedures: NONE   Follow-Up: At [facility], you and your health needs are our priority.  As part of our continuing mission to provide you with exceptional heart care, we have created designated Provider Care Teams.  These Care Teams include your primary Cardiologist (physician) and Advanced Practice Providers (APPs -  Physician Assistants and Nurse Practitioners) who all work together to provide you with the care you need, when you need it.  We recommend signing up for the patient portal called "MyChart".  Sign up information is provided on this After Visit Summary.  MyChart is used to connect with patients for Virtual Visits (Telemedicine).  Patients are able to view lab/test results, encounter notes, upcoming appointments, etc.  Non-urgent messages can be sent to your provider as well.   To learn more about what you can do with MyChart, go to [email or web address].    Your next appointment:   3-4  month(s)  Provider:   Jodelle Red, MD

## 2022-08-07 NOTE — Progress Notes (Signed)
   Care Guide Note  08/07/2022 Name: Ebony Scott MRN: [id number] DOB: 12/07/1950  Referred by: [name], MD Reason for referral : No chief complaint on file.   Ebony Scott is a 72 y.o. year old female who is a primary care patient of Tanya Nones, [name], MD. Ebony Scott was referred to the pharmacist for assistance related to  other .    Successful contact was made with the patient to discuss pharmacy services including being ready for the pharmacist to call at least 5 minutes before the scheduled appointment time, to have medication bottles and any blood sugar or blood pressure readings ready for review. The patient agreed to meet with the pharmacist via with the pharmacist via telephone visit on (date/time).  08/15/2022  Penne Lash, RMA Care Guide [facility]  [place], [place] [address] Direct Dial: [phone] [email or web address]

## 2022-08-07 NOTE — Progress Notes (Signed)
[place] - [place] - [place] - [place] [facility] [place]   Follow-up Note  Referring Provider: [name], MD Primary Provider: [name], MD Date of Office Visit: 08/07/2022  Subjective:   Ebony Scott (DOB: [birth year]) is a 72 y.o. 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  She has done Breo very well concerning her infectious history since I have last seen her in this clinic.  It does not sound as though she has been treated with an antibiotic while she continues to use immunoglobulin infusions on a consistent basis other than antibiotics for a pneumonia March 2024.  She has not really been having any problems with reflux.  Her facial pain/headache syndrome is intermittently active especially for the past few days but for the most part she thinks that is improved while she has been consistently using Periactin at 8 mg a day.  Her sleep dysfunction is not under good control and she takes her Periactin around 8 PM and she goes to sleep around 1 AM and she gets to sleep around 3 AM.  In 2024 she will be having surgery for left rotator cuff and left total knee replacement.  She does not eat shellfish.  Allergies as of 08/07/2022       Reactions   Baclofen Anaphylaxis, Other (See Comments)   Altered mental status requiring a 3-day hospital stay- patient became unresponsive "Put me in a coma for five days", Altered mental status requiring a 3-day hospital stay- patient became unresponsive   Dust Mite Extract Shortness Of Breath, Other (See Comments)   "sneezing" (02/20/2012) too   Molds & Smuts  Shortness Of Breath   Morphine And Codeine Hives, Itching   Other Shortness Of Breath, Other (See Comments)   Grass and weeds "sneezing; filled sinuses" (02/20/2012)   Penicillins Rash, Other (See Comments)   "welts" (02/20/2012) Tolerated Ancef 02/16/20   Rofecoxib Swelling, Other (See Comments)   Vioxx- feet swelling   Shellfish Allergy Anaphylaxis, Shortness Of Breath, Itching, Rash   Shrimp Extract Anaphylaxis, Other (See Comments)   ALL SHELLFISH   Tetracycline Hcl Nausea And Vomiting   Tetracycline Hcl Other (See Comments)   GI Intolerance   Xolair [omalizumab] Other (See Comments)   Caused Blood clot   Zoledronic Acid Other (See Comments)   Reclast- Fever, Put in hospital, dr said it was a reaction from a reaction  Fever, inability to walk, Put in hospital Fever, Put in hospital, dr said it was a reaction from a reaction , Other reaction(s): Unknown   Dilaudid [hydromorphone Hcl] Itching   Hydrocodone-acetaminophen Itching   Levofloxacin Other (See Comments)   GI upset Other Reaction(s): GI Intolerance, Other (See Comments) REACTION: GI upset, GI upset   Oxycodone Hcl Nausea And Vomiting   Oxycodone Hcl Other (See Comments)   GI Intolerance   Paroxetine Nausea And Vomiting, Other (See Comments)   GI Intolerance also   Celecoxib Swelling, Other (See Comments)   Feet swelling   Diltiazem Hcl Swelling   Lactose Other (See Comments)   Bloating and gas   Lactose Intolerance (gi) Other (See Comments)   Bloating and gas   Oxycodone-acetaminophen Itching   Rituximab Other (See  Comments)   Anemia    Tree Extract Other (See Comments)   "tested and told I was allergic to it; never experienced a reaction to it" (02/20/2012)   Gamunex [immune Globulin] Itching, Rash   Tolerates hizentra    Penicillin G Procaine Rash, Other (See Comments)   Welts, also        Medication List    acetaminophen 650 MG CR tablet Commonly known as: TYLENOL Take 1,300 mg by mouth daily as  needed for pain.   acetic acid-hydrocortisone OTIC solution Commonly known as: VOSOL-HC Place 4 drops into both ears See admin instructions. Instill 4 drops into both ears the first 5 days of the month, then as directed/as needed for itching   albuterol 108 (90 Base) MCG/ACT inhaler Commonly known as: VENTOLIN HFA Inhale 2 puffs into the lungs every 6 (six) hours as needed for wheezing or shortness of breath.   ALIGN PO Take 1 capsule by mouth daily.   allopurinol 100 MG tablet Commonly known as: ZYLOPRIM TAKE 1 TABLET BY MOUTH DAILY What changed: when to take this   Alpha-Lipoic Acid 600 MG Tabs Take 600 mg by mouth daily.   ammonium lactate 12 % lotion Commonly known as: Amlactin Daily Apply 1 Application topically as needed for dry skin.   atorvastatin 80 MG tablet Commonly known as: LIPITOR TAKE 1 TABLET BY MOUTH DAILY What changed: when to take this   azelastine 0.1 % nasal spray Commonly known as: ASTELIN Place 2 sprays into both nostrils daily. Use in each nostril as directed   BEANO PO Take 2 tablets by mouth 3 (three) times daily before meals.   benzonatate 200 MG capsule Commonly known as: TESSALON TAKE 1 CAPSULE BY MOUTH THREE TIMES DAILY AS NEEDED FOR COUGH What changed: See the new instructions.   Biofreeze 4 % Gel Generic drug: Menthol (Topical Analgesic) Apply 1 application  topically daily as needed (for shoulder or joint pain).   budesonide 0.5 MG/2ML nebulizer solution Commonly known as: Pulmicort Take 2 mLs (0.5 mg total) by nebulization 2 (two) times daily.   CALCIUM MAGNESIUM PO Take 1 tablet by mouth at bedtime.   cetirizine 10 MG tablet Commonly known as: ZYRTEC Take 1 tablet (10 mg total) by mouth 2 (two) times daily as needed for allergies (Can take a n extra dose during flare ups.). What changed: when to take this   clopidogrel 75 MG tablet Commonly known as: PLAVIX Take 1 tablet (75 mg total) by mouth daily.   cyproheptadine 4  MG tablet Commonly known as: PERIACTIN TAKE 2 TABLETS BY MOUTH AT BEDTIME AS NEEDED TO TREAT FACIAL PAIN OR SLEEP DYSFUNCTION   denosumab 60 MG/ML Sosy injection Commonly known as: PROLIA Inject 60 mg into the skin every 6 (six) months.   dexlansoprazole 60 MG capsule Commonly known as: Dexilant Take 1 capsule (60 mg total) by mouth daily.   empagliflozin 10 MG Tabs tablet Commonly known as: Jardiance Take 1 tablet (10 mg total) by mouth daily before breakfast.   EPINEPHrine 0.3 mg/0.3 mL Soaj injection Commonly known as: EPI-PEN USE AS DIRECTED BY YOUR PHYSICIAN INTRAMUSCULARLY AS NEEDED FOR ANAPHYLAXIS   famotidine 20 MG tablet Commonly known as: PEPCID Take 1 tablet (20 mg total) by mouth daily. TAKE 1 TABLET BY MOUTH EACH NIGHT AT BEDTIME Strength: 20 mg What changed: See the new instructions. Changed by: Annita Ratliff Claudia Pollock, MD   fluticasone 50 MCG/ACT nasal spray Commonly known as: FLONASE USE 2 SPRAYS IN [facility]  NOSTRIL DAILY   folic acid 1 MG tablet Commonly known as: FOLVITE Take 1 tablet (1 mg total) by mouth daily.   furosemide 40 MG tablet Commonly known as: LASIX DAILY AS NEEDED FOR WEIGHT GAIN OF 2 POUNDS OVERNIGHT OR 5 POUNDS IN ONE WEEK   gabapentin 300 MG capsule Commonly known as: NEURONTIN Take 1 capsule (300 mg total) by mouth at bedtime.   Gerhardt's butt cream Crea Apply topically 3 times a day as needed for itching or irritation   GoodSense Arthritis Pain 1 % Gel Generic drug: diclofenac Sodium Apply 1 Application topically 4 times daily as needed for pain.   guaiFENesin 600 MG 12 hr tablet Commonly known as: MUCINEX Take 1 tablet (600 mg total) by mouth 2 (two) times daily as needed for cough or to loosen phlegm. What changed: when to take this   Hizentra 10 GM/50ML Soln Generic drug: Immune Globulin (Human) Inject into the skin. What changed: Another medication with the same name was removed. Continue taking this medication, and follow the  directions you see here. Changed by: Darral Dash, LPN   ipratropium-albuterol 0.5-2.5 (3) MG/3ML Soln Commonly known as: DUONEB Take 3 mLs by nebulization every 6 (six) hours as needed.   isosorbide-hydrALAZINE 20-37.5 MG tablet Commonly known as: BIDIL Take 1 tablet by mouth 2 (two) times daily.   LACTOSE FAST ACTING RELIEF PO Take 3 tablets by mouth 3 (three) times daily before meals.   lidocaine-prilocaine cream Commonly known as: EMLA Apply 1 application  topically as needed (pain).   magnesium oxide 400 (240 Mg) MG tablet Commonly known as: MAG-OX Take 400 mg by mouth at bedtime.   mirtazapine 30 MG disintegrating tablet Commonly known as: REMERON SOL-TAB Take 1 tablet (30 mg total) by mouth at bedtime as needed (sleep). What changed: when to take this   montelukast 10 MG tablet Commonly known as: SINGULAIR Take 1 tablet (10 mg total) by mouth daily after supper.   neomycin-polymyxin-hydrocortisone 3.5-10000-1 OTIC suspension Commonly known as: CORTISPORIN Place into both ears 4 (four) times daily.   nystatin 100000 UNIT/ML suspension Commonly known as: MYCOSTATIN Use as directed 15 mLs in the mouth or throat at bedtime.   potassium chloride 10 MEQ tablet Commonly known as: KLOR-CON M DAILY AS NEEDED WHEN TAKING LASIX   potassium chloride 10 MEQ tablet Commonly known as: KLOR-CON Take 10 mEq by mouth daily as needed.   predniSONE 5 MG tablet Commonly known as: DELTASONE Take 1 tablet (5 mg total) by mouth daily with breakfast.   predniSONE 1 MG tablet Commonly known as: DELTASONE Take 2 tablets (2 mg total) by mouth daily with breakfast. Take along with 5 mg tablet daily.   PRENATAL VITAMINS PO Take 1 tablet by mouth daily.   sacubitril-valsartan 24-26 MG Commonly known as: ENTRESTO Take 1 tablet by mouth 2 (two) times daily.   Stiolto Respimat 2.5-2.5 MCG/ACT Aers Generic drug: Tiotropium Bromide-Olodaterol Inhale 2 puffs into the lungs  daily.   Systane Complete 0.6 % Soln Generic drug: Propylene Glycol Place 1 drop into both eyes 2 (two) times daily.   traMADol 50 MG tablet Commonly known as: ULTRAM Take 1 tablet (50 mg total) by mouth at bedtime.   triamcinolone 0.1 % cream : eucerin Crea Apply 1 Application topically 2 (two) times daily.   triamcinolone ointment 0.5 % Commonly known as: KENALOG Apply 1 Application topically 2 (two) times daily.   valsartan 40 MG tablet Commonly known as: DIOVAN Take 40  mg by mouth daily.   Vitamin D3 25 MCG (1000 UT) Caps Take 1,000 Units by mouth at bedtime.   Xarelto 20 MG Tabs tablet Generic drug: rivaroxaban TAKE 1 TABLET BY MOUTH DAILY WITH SUPPER What changed: how much to take        Past Medical History:  Diagnosis Date  . Abscess of dorsum of right hand 09/22/2020  . Acute deep vein thrombosis (DVT) of right lower extremity (HCC) 12/13/2017  . Allergic rhinitis   . Anemia   . Angio-edema   . Anxiety    pt denies dx  . Arthritis    Phreesia 10/21/2019  . Asthma    Phreesia 10/21/2019  . Breast cancer (HCC) 1998   Left breast, in remission  . Bronchiectasis (HCC)   . Cataract    REMOVED  . CHF (congestive heart failure) (HCC)   . Clostridium difficile colitis 01/14/2018  . Complication of anesthesia    "had hard time waking up from it several times" (02/20/2012), no anesthesia problems since 2014  . Coronary artery disease   . Depression    "some; don't take anything for it" (02/20/2012), pt denies dx as of 06/02/21  . Diverticulosis   . DVT (deep venous thrombosis) (HCC)   . Dyspnea    all the time  . Fibromyalgia 11/2011  . GAVE (gastric antral vascular ectasia)   . GERD (gastroesophageal reflux disease)   . Graves disease   . Headache(784.0)    "related to allergies; more at different times during the year" (02/20/2012)  . Hemorrhoids   . Hiatal hernia    back and neck  . History of blood transfusion 2023   1 unit, 5 units of Iron  . Hx  of adenomatous colonic polyps 04/12/2016  . Hypercholesteremia    good cholesterol is high  . Hypothyroidism   . IBS (irritable bowel syndrome)   . Moderate persistent asthma    -FeV1 72% 2011, -IgE 102 2011, CT sinus Neg 2011  . Osteomyelitis of second toe of right foot (HCC) 09/23/2020  . Osteoporosis    on reclast yearly  . Peripheral vascular disease (HCC) 2019   DVTs (3) lungs and leg  . Pneumonia 04/2011; ~ 11/2011   "double; single" (02/20/2012)  . Pneumonia   . Septic olecranon bursitis of right elbow 09/22/2020  . Seronegative rheumatoid arthritis (HCC)    Dr. Casimer Lanius  . SIRS (systemic inflammatory response syndrome) (HCC) 02/10/2018  . Sleep apnea    uses cpap nightly  . Tracheobronchomalacia     Past Surgical History:  Procedure Laterality Date  . ABDOMINAL HYSTERECTOMY N/A    Phreesia 10/21/2019  . ANTERIOR AND POSTERIOR REPAIR  1990's  . APPENDECTOMY    . ARGON LASER APPLICATION  02/05/2021   Procedure: ARGON LASER APPLICATION;  Surgeon: Benancio Deeds, MD;  Location: [facility];  Service: Gastroenterology;;  . BREAST LUMPECTOMY  1998   left  . BREAST SURGERY N/A    Phreesia 10/21/2019  . BRONCHIAL WASHINGS  04/05/2020   Procedure: BRONCHIAL WASHINGS;  Surgeon: Martina Sinner, MD;  Location: [facility];  Service: Pulmonary;;  . CAPSULOTOMY Right 08/28/2021   Procedure: CAPSULOTOMY;  Surgeon: Candelaria Stagers, DPM;  Location: [facility] OR;  Service: Podiatry;  Laterality: Right;  . CARPOMETACARPEL (CMC) FUSION OF THUMB WITH AUTOGRAFT FROM RADIUS  ~ 2009   "both thumbs" (02/20/2012)  . CATARACT EXTRACTION W/ INTRAOCULAR LENS  IMPLANT, BILATERAL  2012  . CERVICAL DISCECTOMY  10/2001  C5-C6  . CERVICAL FUSION  2003   C3-C4  . CHOLECYSTECTOMY    . COLONOSCOPY    . CORONARY STENT INTERVENTION N/A 01/11/2021   Procedure: CORONARY STENT INTERVENTION;  Surgeon: Marykay Lex, MD;  [facility] INVASIVE CV LAB;  Service: Cardiovascular;  Laterality: N/A;   . DEBRIDEMENT TENNIS ELBOW  ?1970's   right  . ESOPHAGOGASTRODUODENOSCOPY    . ESOPHAGOGASTRODUODENOSCOPY (EGD) WITH PROPOFOL N/A 02/05/2021   Procedure: ESOPHAGOGASTRODUODENOSCOPY (EGD) WITH PROPOFOL;  Surgeon: Benancio Deeds, MD;  Location: [facility];  Service: Gastroenterology;  Laterality: N/A;  . EYE SURGERY N/A    Phreesia 10/21/2019  . HAMMER TOE SURGERY Right 08/28/2021   Procedure: HAMMER TOE REPAIR 2ND TOE RIGHT FOOT;  Surgeon: Candelaria Stagers, DPM;  Location: [facility];  Service: Podiatry;  Laterality: Right;  . HYSTERECTOMY    . I & D EXTREMITY Right 09/22/2020   Procedure: IRRIGATION AND DEBRIDEMENT RIGHT HAND AND ELBOW;  Surgeon: Teryl Lucy, MD;  Location: [facility];  Service: Orthopedics;  Laterality: Right;  . KNEE ARTHROPLASTY  ?1990's   "?right; w/cartilage repair" (02/20/2012)  . MASS EXCISION Left 08/28/2021   Procedure: EXCISION OF SOFT TISSUE  MASS LEFT FOOT;  Surgeon: Candelaria Stagers, DPM;  Location: [facility];  Service: Podiatry;  Laterality: Left;  . NASAL SEPTUM SURGERY  1980's  . POSTERIOR CERVICAL FUSION/FORAMINOTOMY  2004   "failed initial fusion; rewired  anterior neck" (02/20/2012)  . REVERSE SHOULDER ARTHROPLASTY Right 02/16/2020   Procedure: REVERSE SHOULDER ARTHROPLASTY;  Surgeon: Teryl Lucy, MD;  Location: [facility];  Service: Orthopedics;  Laterality: Right;  . RIGHT/LEFT HEART CATH AND CORONARY ANGIOGRAPHY N/A 01/11/2021   Procedure: RIGHT/LEFT HEART CATH AND CORONARY ANGIOGRAPHY;  Surgeon: Marykay Lex, MD;  Location[facility] INVASIVE CV LAB;  Service: Cardiovascular;  Laterality: N/A;  . SPINE SURGERY N/A    Phreesia 10/21/2019  . TONSILLECTOMY  ~ 1953  . VESICOVAGINAL FISTULA CLOSURE W/ TAH  1988  . VIDEO BRONCHOSCOPY Bilateral 08/23/2016   Procedure: VIDEO BRONCHOSCOPY WITH FLUORO;  Surgeon: Roslynn Amble, MD;  Location: [facility];  Service: Cardiopulmonary;  Laterality: Bilateral;  . VIDEO BRONCHOSCOPY N/A 04/05/2020   Procedure: VIDEO  BRONCHOSCOPY WITHOUT FLUORO;  Surgeon: Martina Sinner, MD;  Location: [facility];  Service: Pulmonary;  Laterality: N/A;    Review of systems negative except as noted in HPI / PMHx or noted below:  Review of Systems  Constitutional: Negative.   HENT: Negative.    Eyes: Negative.   Respiratory: Negative.    Cardiovascular: Negative.   Gastrointestinal: Negative.   Genitourinary: Negative.   Musculoskeletal: Negative.   Skin: Negative.   Neurological: Negative.   Endo/Heme/Allergies: Negative.   Psychiatric/Behavioral: Negative.       Objective:   There were no vitals filed for this visit.        Physical Exam Constitutional:      Appearance: She is not diaphoretic.  HENT:     Head: Normocephalic.     Right Ear: Tympanic membrane, ear canal and external ear normal.     Left Ear: Tympanic membrane, ear canal and external ear normal.     Nose: Nose normal. No mucosal edema or rhinorrhea.     Mouth/Throat:     Pharynx: Uvula midline. No oropharyngeal exudate.  Eyes:     Conjunctiva/sclera: Conjunctivae normal.  Neck:     Thyroid: No thyromegaly.     Trachea: Trachea normal. No tracheal tenderness or tracheal deviation.  Cardiovascular:  Rate and Rhythm: Normal rate and regular rhythm.     Heart sounds: Normal heart sounds, S1 normal and S2 normal. No murmur heard. Pulmonary:     Effort: No respiratory distress.     Breath sounds: Normal breath sounds. No stridor. No wheezing or rales.  Lymphadenopathy:     Head:     Right side of head: No tonsillar adenopathy.     Left side of head: No tonsillar adenopathy.     Cervical: No cervical adenopathy.  Skin:    Findings: No erythema or rash.     Nails: There is no clubbing.  Neurological:     Mental Status: She is alert.    Diagnostics:    Spirometry was performed and demonstrated an FEV1 of *** at *** % of predicted.  The patient had an Asthma Control Test with the following results:  .    Breo also  blood tests obtained for June 2024 identifies IgG 1030 mg/DL, IgA 94 Mg/DL, IgM 46 Mg/DL, WBC 9.0, absolute eosinophil 100, absolute lymphocyte 700, hemoglobin 10.5  Results of a head CT scan obtained 25 April 2022 identified the following:  Sinuses/Orbits: There is mucosal thickening in the ethmoid sinus.   Results of a high-resolution chest CT scan obtained 16 April 2022 identified the following:  Cardiovascular: Aortic atherosclerosis. Aortic valve calcifications. Normal heart size. Three-vessel coronary artery calcifications and or stents no pericardial effusion.   Mediastinum/Nodes: No enlarged mediastinal, hilar, or axillary lymph nodes. Thyroid gland, trachea, and esophagus demonstrate no significant findings.   Lungs/Pleura: Parke Simmers appearing scarring of the bilateral lung bases with mild associated tubular bronchiectasis, similar to prior examination. No significant air trapping on expiratory phase imaging. Fine, clustered centrilobular and tree-in-bud nodularity of the posterior right upper lobe, unchanged (series 7, image 66). Minimal subpleural radiation fibrosis of the anterior left lung (series 7, image 70). No pleural effusion or pneumothorax.  Results of a abdominal CT scan obtained 25 April 2022 identifies the following:  Lower chest: Moderate patchy infiltrates are seen in right lower lung fields. Small patchy infiltrates are seen in left lower lung field. There is no pleural effusion.  Assessment and Plan:   1. Hypogammaglobulinemia (HCC)   2. Asthma, severe persistent, well-controlled   3. Facial pain syndrome   4. Sleep dysfunction with sleep stage disturbance   5. LPRD (laryngopharyngeal reflux disease)     Patient Instructions   1. 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 INCREASE Periactin 4 mg tablet  3 tablets at bedtime  2. Continue to Treat laryngopharyngeal reflux:    A. Dexilant 60 mg in the morning  B. famotidine 40 mg  daily  3.  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  Continue immunoglobulin infusions solution  4.  Use Astelin and Zyrtec and nasal saline and topical steroid if needed  5.  Continue therapy directed by pulmonology and rheumatology  6.  Continue percussion vest to treat bronchiectasis/phlegm production  7.  Plan for fall flu vaccine  8.  Return to clinic in November 2024 or earlier if problem     Laurette Schimke, MD Allergy / Immunology [facility]

## 2022-08-07 NOTE — Patient Instructions (Addendum)
  1. 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 INCREASE Periactin 4 mg tablet  3 tablets at bedtime  2. Continue to Treat laryngopharyngeal reflux:    A. Dexilant 60 mg in the morning  B. famotidine 40 mg daily  3.  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  Continue immunoglobulin infusions solution  4.  Use Astelin and Zyrtec and nasal saline and topical steroid if needed  5.  Continue therapy directed by pulmonology and rheumatology  6.  Continue percussion vest to treat bronchiectasis/phlegm production  7.  Plan for fall flu vaccine  8.  Return to clinic in November 2024 or earlier if problem

## 2022-08-07 NOTE — Progress Notes (Unsigned)
Office Visit    Patient Name: Ebony Scott Date of Encounter: 08/07/2022  PCP:  [name], MD   [facility]  Cardiologist:  Jodelle Red, MD  Advanced Practice Provider:  No care team member to display Electrophysiologist:  None      Chief Complaint    Ebony Scott is a 72 y.o. female  presents today for heart failure follow up.   Past Medical History    Past Medical History:  Diagnosis Date   Abscess of dorsum of right hand 09/22/2020   Acute deep vein thrombosis (DVT) of right lower extremity (HCC) 12/13/2017   Allergic rhinitis    Anemia    Angio-edema    Anxiety    pt denies dx   Arthritis    Phreesia 10/21/2019   Asthma    Phreesia 10/21/2019   Breast cancer (HCC) 1998   Left breast, in remission   Bronchiectasis (HCC)    Cataract    REMOVED   CHF (congestive heart failure) (HCC)    Clostridium difficile colitis 01/14/2018   Complication of anesthesia    "had hard time waking up from it several times" (02/20/2012), no anesthesia problems since 2014   Coronary artery disease    Depression    "some; don't take anything for it" (02/20/2012), pt denies dx as of 06/02/21   Diverticulosis    DVT (deep venous thrombosis) (HCC)    Dyspnea    all the time   Fibromyalgia 11/2011   GAVE (gastric antral vascular ectasia)    GERD (gastroesophageal reflux disease)    Graves disease    Headache(784.0)    "related to allergies; more at different times during the year" (02/20/2012)   Hemorrhoids    Hiatal hernia    back and neck   History of blood transfusion 2023   1 unit, 5 units of Iron   Hx of adenomatous colonic polyps 04/12/2016   Hypercholesteremia    good cholesterol is high   Hypothyroidism    IBS (irritable bowel syndrome)    Moderate persistent asthma    -FeV1 72% 2011, -IgE 102 2011, CT sinus Neg 2011   Osteomyelitis of second toe of right foot (HCC) 09/23/2020   Osteoporosis    on reclast yearly   Peripheral  vascular disease (HCC) 2019   DVTs (3) lungs and leg   Pneumonia 04/2011; ~ 11/2011   "double; single" (02/20/2012)   Pneumonia    Septic olecranon bursitis of right elbow 09/22/2020   Seronegative rheumatoid arthritis (HCC)    Dr. Casimer Lanius   SIRS (systemic inflammatory response syndrome) (HCC) 02/10/2018   Sleep apnea    uses cpap nightly   Tracheobronchomalacia    Past Surgical History:  Procedure Laterality Date   ABDOMINAL HYSTERECTOMY N/A    Phreesia 10/21/2019   ANTERIOR AND POSTERIOR REPAIR  1990's   APPENDECTOMY     ARGON LASER APPLICATION  02/05/2021   Procedure: ARGON LASER APPLICATION;  Surgeon: Benancio Deeds, MD;  Location: [facility];  Service: Gastroenterology;;   BREAST LUMPECTOMY  1998   left   BREAST SURGERY N/A    Phreesia 10/21/2019   BRONCHIAL WASHINGS  04/05/2020   Procedure: BRONCHIAL WASHINGS;  Surgeon: Martina Sinner, MD;  Location: [facility];  Service: Pulmonary;;   CAPSULOTOMY Right 08/28/2021   Procedure: CAPSULOTOMY;  Surgeon: Candelaria Stagers, DPM;  Location: [facility];  Service: Podiatry;  Laterality: Right;   CARPOMETACARPEL (CMC) FUSION OF THUMB  WITH AUTOGRAFT FROM RADIUS  ~ 2009   "both thumbs" (02/20/2012)   CATARACT EXTRACTION W/ INTRAOCULAR LENS  IMPLANT, BILATERAL  2012   CERVICAL DISCECTOMY  10/2001   C5-C6   CERVICAL FUSION  2003   C3-C4   CHOLECYSTECTOMY     COLONOSCOPY     CORONARY STENT INTERVENTION N/A 01/11/2021   Procedure: CORONARY STENT INTERVENTION;  Surgeon: Marykay Lex, MD;  Location: [facility] INVASIVE CV LAB;  Service: Cardiovascular;  Laterality: N/A;   DEBRIDEMENT TENNIS ELBOW  ?1970's   right   ESOPHAGOGASTRODUODENOSCOPY     ESOPHAGOGASTRODUODENOSCOPY (EGD) WITH PROPOFOL N/A 02/05/2021   Procedure: ESOPHAGOGASTRODUODENOSCOPY (EGD) WITH PROPOFOL;  Surgeon: Benancio Deeds, MD;  Location: [facility];  Service: Gastroenterology;  Laterality: N/A;   EYE SURGERY N/A    Phreesia 10/21/2019   HAMMER TOE  SURGERY Right 08/28/2021   Procedure: HAMMER TOE REPAIR 2ND TOE RIGHT FOOT;  Surgeon: Candelaria Stagers, DPM;  Location: [facility];  Service: Podiatry;  Laterality: Right;   HYSTERECTOMY     I & D EXTREMITY Right 09/22/2020   Procedure: IRRIGATION AND DEBRIDEMENT RIGHT HAND AND ELBOW;  Surgeon: Teryl Lucy, MD;  Location: [facility];  Service: Orthopedics;  Laterality: Right;   KNEE ARTHROPLASTY  ?1990's   "?right; w/cartilage repair" (02/20/2012)   MASS EXCISION Left 08/28/2021   Procedure: EXCISION OF SOFT TISSUE  MASS LEFT FOOT;  Surgeon: Candelaria Stagers, DPM;  Location: [facility];  Service: Podiatry;  Laterality: Left;   NASAL SEPTUM SURGERY  1980's   POSTERIOR CERVICAL FUSION/FORAMINOTOMY  2004   "failed initial fusion; rewired  anterior neck" (02/20/2012)   REVERSE SHOULDER ARTHROPLASTY Right 02/16/2020   Procedure: REVERSE SHOULDER ARTHROPLASTY;  Surgeon: Teryl Lucy, MD;  Location: [facility];  Service: Orthopedics;  Laterality: Right;   RIGHT/LEFT HEART CATH AND CORONARY ANGIOGRAPHY N/A 01/11/2021   Procedure: RIGHT/LEFT HEART CATH AND CORONARY ANGIOGRAPHY;  Surgeon: Marykay Lex, MD;  Location: [facility] INVASIVE CV LAB;  Service: Cardiovascular;  Laterality: N/A;   SPINE SURGERY N/A    Phreesia 10/21/2019   TONSILLECTOMY  ~ 1953   VESICOVAGINAL FISTULA CLOSURE W/ TAH  1988   VIDEO BRONCHOSCOPY Bilateral 08/23/2016   Procedure: VIDEO BRONCHOSCOPY WITH FLUORO;  Surgeon: Roslynn Amble, MD;  Location: [facility];  Service: Cardiopulmonary;  Laterality: Bilateral;   VIDEO BRONCHOSCOPY N/A 04/05/2020   Procedure: VIDEO BRONCHOSCOPY WITHOUT FLUORO;  Surgeon: Martina Sinner, MD;  Location: [facility];  Service: Pulmonary;  Laterality: N/A;    Allergies  Allergies  Allergen Reactions   Baclofen Anaphylaxis and Other (See Comments)    Altered mental status requiring a 3-day hospital stay- patient became unresponsive   "Put me in a coma for five days", Altered mental status requiring a 3-day  hospital stay- patient became unresponsive   Dust Mite Extract Shortness Of Breath and Other (See Comments)    "sneezing" (02/20/2012) too   Molds & Smuts Shortness Of Breath   Morphine And Codeine Hives and Itching   Other Shortness Of Breath and Other (See Comments)    Grass and weeds "sneezing; filled sinuses" (02/20/2012)   Penicillins Rash and Other (See Comments)    "welts" (02/20/2012)  Tolerated Ancef 02/16/20   Rofecoxib Swelling and Other (See Comments)    Vioxx- feet swelling   Shellfish Allergy Anaphylaxis, Shortness Of Breath, Itching and Rash   Shrimp Extract Anaphylaxis and Other (See Comments)    ALL SHELLFISH   Tetracycline Hcl Nausea And Vomiting  Tetracycline Hcl Other (See Comments)    GI Intolerance   Xolair [Omalizumab] Other (See Comments)    Caused Blood clot   Zoledronic Acid Other (See Comments)    Reclast- Fever, Put in hospital, dr said it was a reaction from a reaction    Fever, inability to walk, Put in hospital  Fever, Put in hospital, dr said it was a reaction from a reaction , Other reaction(s): Unknown   Dilaudid [Hydromorphone Hcl] Itching   Hydrocodone-Acetaminophen Itching   Levofloxacin Other (See Comments)    GI upset  Other Reaction(s): GI Intolerance, Other (See Comments)  REACTION: GI upset, GI upset   Oxycodone Hcl Nausea And Vomiting   Oxycodone Hcl Other (See Comments)    GI Intolerance   Paroxetine Nausea And Vomiting and Other (See Comments)    GI Intolerance also   Celecoxib Swelling and Other (See Comments)    Feet swelling   Diltiazem Hcl Swelling   Lactose Other (See Comments)    Bloating and gas   Lactose Intolerance (Gi) Other (See Comments)    Bloating and gas   Oxycodone-Acetaminophen Itching   Rituximab Other (See Comments)    Anemia    Tree Extract Other (See Comments)    "tested and told I was allergic to it; never experienced a reaction to it" (02/20/2012)   Gamunex [Immune Globulin] Itching and Rash     Tolerates hizentra    Penicillin G Procaine Rash and Other (See Comments)    Welts, also    History of Present Illness    ANARA MARSTON is a 72 y.o. 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   She was hospitalized July 2022 with acute renal failure, acute systolic and diastolic heart failure.  Echo new onset heart failure with LVEF 35-40%, wall motion abnormalities, LV moderately dilated, grade 2 diastolic dysfunction, elevated LVEDP, mild MR, moderate calcification of aortic valve without stenosis. In follow up Farxiga, ACE/ARB/Arni/MRA were on hold due to recent kidney injury.  Cardiac cath was held due to kidney dysfunction. She was started on half tablet of Bidil 20-27.5mg  BID. She was encouraged to take Lasix 40mg  QD as prescribed.  She was started on weekly injections of IVIG by Dr. Lucie Leather for immunodeficiency. PCP later reduced Lasix to 20mg  QD due to renal function. At visit 11/30/20 Jardiance 10mg  QD initiated. Underwent LHC 01/11/21 with DES to LAD. Discharged on Aspirin 81mg  QD x 1 month, Plavix 75mg  QD x 6 mos, and indefinite Xarelto due to hx of PE and DVT. She had moderate pulmonary hypertension and moderate to severely reduced cardiac function . She was recommended for Lasix 40mg  BID x 3 days then 40mg  QD.   Admitted 02/04/21-02/07/21 and required blood transfusion. Aspirin/Plavix/Xarelto held initially and Gi consulted. 02/15/21 EGD showed gastric antral vascular ectasia treated with APC. Plavix resumed. Xarelto was also able to be resumed.   Admitted 3/6-3/16/24 with sepsis and encephalopathy due to CAP treated with abc. Evaluated by GI for acute proctitis. Discharged on Entresto, Bidil, Farxiga, Lasix.   Presents today  for follow up with her daughter. Wearing CPAP regularly with her oxygen at night. Reports no orthopnea, PND. No LE edema. Has not had to taken Lasix in several weeks. Weight at home 126 lbs. Varying only by one pound. Appetite  has been good. She is taking her Entresto, Bidil both once daily - discussed need for BID dosing. Interesting in pursuing shoulder/knee surgery.   Pulmonology 07/20/22 treated for asthma exacerbation with prolonged predisone taper.   Ankles and feet swelling. Last took a fluid pill weeks ago.   Dr. Dion Saucier - MRI a few Saturdays ago  EKGs/Labs/Other Studies Reviewed:   The following studies were reviewed today: Cardiac Studies & Procedures   CARDIAC CATHETERIZATION  CARDIAC CATHETERIZATION 01/11/2021  Narrative   Prox LAD lesion is 95% stenosed proximal to 1st Diag &  Mid LAD lesion is 90% stenosed following   Mid LAD lesion is 90% stenosed.   A drug-eluting stent was successfully placed covering both lesions using a STENT ONYX FRONTIER 2.25X34.   Post intervention, there is a 0% residual stenosis.   --------------------------------------------------------------   Hemodynamic findings consistent with moderate pulmonary hypertension.   There is no aortic valve stenosis.  SUMMARY Severe single-vessel CAD with 95% and 90% stenoses of the proximal and mid LAD at major 1st Diag branch.  Likely be culprit for reduced EF and anterior hypokinesis on echo. Successful DES PCI of the LAD using an Onyx Frontier DES 2.25 mm x 34 mm postdilated in tapered fashion from 2.6 to 2.55mm. TIMI III flow pre and post, reduced to 0% Otherwise minimal CAD in the Right dominant system. 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 Moderate to severely reduced cardiac function with Cardiac Output-Index of 4.05-2.59 by Fick and 3.75-2.40 by Thermal  Dilution.  RECOMMENDATIONS Continue to titrate medical management of chronic Systolic and diastolic heart failure.  She was given 40 mg IV Lasix on the Cath Lab table.  Recommend that she takes 40 mg twice daily oral Lasix for 3 days postprocedure. She will need close follow-up with either primary cardiologist or APP to initiate titration of medications. Clinically stable for d/c today    Bryan Lemma, MD  Findings Coronary Findings Diagnostic  Dominance: Right  Left Anterior Descending Prox LAD lesion is 95% stenosed. Vessel is the culprit lesion. The lesion is located proximal to the major branch and concentric. Mid LAD lesion is 90% stenosed. The lesion is distal to major branch and concentric.  First Diagonal Branch Vessel is small in size.  First Septal Branch Vessel is small in size.  Second Diagonal Branch Vessel is small in size.  Left Circumflex The vessel exhibits minimal luminal irregularities.  First Obtuse Marginal Branch The vessel exhibits minimal luminal irregularities.  Right Coronary Artery The vessel exhibits minimal luminal irregularities.  Acute Marginal Branch Vessel is small in size.  Right Ventricular Branch Vessel is small in size.  First Right Posterolateral Branch Vessel is small in size.  Intervention  Prox LAD lesion Stent (Also treats lesions: Mid LAD) Lesion length:  30 mm. CATH VISTA GUIDE 6FR XB3 guide catheter was inserted. Lesion crossed with guidewire using a WIRE ASAHI PROWATER 180CM. Pre-stent angioplasty was performed using a BALLN SAPPHIRE 2.0X15. Maximum pressure:  10 atm. Inflation time: 20 sec. A drug-eluting stent was successfully placed using a STENT ONYX FRONTIER 2.25X34. After initial deployment, the sidebranch wire was removed prior to postdilated with stent balloon Maximum pressure: 16 atm. Inflation time: 30 sec. Stent strut is well apposed. Post-stent angioplasty was performed using a BALLN SAPPHIRE [place] 2.5X15.  Maximum pressure:  18 atm. Inflation time:  20 sec. Proximal 30 mm  of the stent: Tapered stent from 2.65-2.4 BMW wire was used to wire the diagonal branch for marking.  Was not used for intervention. Post-Intervention Lesion Assessment The intervention was successful. Pre-interventional TIMI flow is 3. Post-intervention TIMI flow is 3. Treated lesion length:  34 mm. No complications occurred at this lesion. There is a 0% residual stenosis post intervention.  Mid LAD lesion Stent (Also treats lesions: Prox LAD) See details in Prox LAD lesion. Post-Intervention Lesion Assessment The intervention was successful. Pre-interventional TIMI flow is 3. Post-intervention TIMI flow is 3. There is a 0% residual stenosis post intervention.     ECHOCARDIOGRAM  ECHOCARDIOGRAM COMPLETE 07/17/2022  Narrative ECHOCARDIOGRAM REPORT    Patient Name:   KALANA STEGENGA  Date of Exam: 07/17/2022 Medical Rec #:  [id number]     Height:       61.0 in Accession #:    [id number]    Weight:       132.0 lb Date of Birth:  03-18-1950      BSA:          1.583 m Patient Age:    72 years      BP:           122/60 mmHg Patient Gender: F             HR:           69 bpm. Exam Location:  [address]  Procedure: 2D Echo, Color Doppler, Cardiac Doppler and Intracardiac Opacification Agent  Indications:    Congestive Heart Failure I50.9  History:        Patient has prior history of Echocardiogram examinations, most recent 05/25/2021. CHF; CAD.  Sonographer:    Thurman Coyer RDCS Referring Phys: [id number] Lakethia Coppess S Kacy Hegna  IMPRESSIONS   1. Left ventricular ejection fraction, by estimation, is 30 to 35%. The left ventricle has moderately decreased function. The left ventricle demonstrates regional wall motion abnormalities (see scoring diagram/findings for description). There is mild left ventricular hypertrophy. Left ventricular diastolic parameters are consistent with Grade I diastolic dysfunction (impaired  relaxation). Elevated left ventricular end-diastolic pressure. There is moderate hypokinesis of the left ventricular, mid-apical anteroseptal wall, apical segment and inferoapical segment. There is moderate hypokinesis of the left ventricular, globally. 2. Right ventricular systolic function is low normal. The right ventricular size is normal. Tricuspid regurgitation signal is inadequate for assessing PA pressure. 3. Left atrial size was moderately dilated. 4. The mitral valve is abnormal. Trivial mitral valve regurgitation. 5. The aortic valve is tricuspid. Aortic valve regurgitation is not visualized. Mild aortic valve stenosis. Aortic valve area, by VTI measures 1.43 cm. Aortic valve mean gradient measures 10.0 mmHg. Aortic valve Vmax measures 2.06 m/s. 6. The inferior vena cava is normal in size with greater than 50% respiratory variability, suggesting right atrial pressure of 3 mmHg.  Comparison(s): No significant change from prior study. 05/25/2021: LVEF 30-35%.  FINDINGS Left Ventricle: Left ventricular ejection fraction, by estimation, is 30 to 35%. The left ventricle has moderately decreased function. The left ventricle demonstrates regional wall motion abnormalities. Moderate hypokinesis of the left ventricular, mid-apical anteroseptal wall, apical segment and inferoapical segment. Definity contrast agent was given IV to delineate the left ventricular endocardial borders. The left ventricular internal cavity size was normal in size. There is mild left ventricular hypertrophy. Left ventricular diastolic parameters are consistent with Grade I diastolic dysfunction (impaired relaxation). Elevated left ventricular end-diastolic pressure.  Right Ventricle: The right ventricular size is normal. No increase in right  ventricular wall thickness. Right ventricular systolic function is low normal. Tricuspid regurgitation signal is inadequate for assessing PA pressure.  Left Atrium: Left atrial size  was moderately dilated.  Right Atrium: Right atrial size was normal in size.  Pericardium: There is no evidence of pericardial effusion.  Mitral Valve: The mitral valve is abnormal. Mild mitral annular calcification. Trivial mitral valve regurgitation.  Tricuspid Valve: The tricuspid valve is grossly normal. Tricuspid valve regurgitation is trivial.  Aortic Valve: The aortic valve is tricuspid. Aortic valve regurgitation is not visualized. Mild aortic stenosis is present. Aortic valve mean gradient measures 10.0 mmHg. Aortic valve peak gradient measures 17.1 mmHg. Aortic valve area, by VTI measures 1.43 cm.  Pulmonic Valve: The pulmonic valve was grossly normal. Pulmonic valve regurgitation is trivial.  Aorta: The aortic root and ascending aorta are structurally normal, with no evidence of dilitation.  Venous: The inferior vena cava is normal in size with greater than 50% respiratory variability, suggesting right atrial pressure of 3 mmHg.  IAS/Shunts: No atrial level shunt detected by color flow Doppler.   LEFT VENTRICLE PLAX 2D LVIDd:         4.95 cm   Diastology LVIDs:         3.65 cm   LV e' medial:    4.29 cm/s LV PW:         1.00 cm   LV E/e' medial:  24.2 LV IVS:        1.30 cm   LV e' lateral:   6.92 cm/s LVOT diam:     2.00 cm   LV E/e' lateral: 15.0 LV SV:         71 LV SV Index:   45 LVOT Area:     3.14 cm   RIGHT VENTRICLE RV Basal diam:  2.80 cm RV S prime:     10.30 cm/s TAPSE (M-mode): 1.7 cm  LEFT ATRIUM           Index        RIGHT ATRIUM           Index LA diam:      4.10 cm 2.59 cm/m   RA Area:     11.70 cm LA Vol (A2C): 52.1 ml 32.91 ml/m  RA Volume:   24.70 ml  15.60 ml/m LA Vol (A4C): 58.6 ml 37.02 ml/m AORTIC VALVE AV Area (Vmax):    1.47 cm AV Area (Vmean):   1.44 cm AV Area (VTI):     1.43 cm AV Vmax:           206.50 cm/s AV Vmean:          145.500 cm/s AV VTI:            0.492 m AV Peak Grad:      17.1 mmHg AV Mean Grad:       10.0 mmHg LVOT Vmax:         96.90 cm/s LVOT Vmean:        66.650 cm/s LVOT VTI:          0.224 m LVOT/AV VTI ratio: 0.46  AORTA Ao Root diam: 2.60 cm Ao Asc diam:  3.10 cm  MITRAL VALVE MV Area (PHT): 4.06 cm     SHUNTS MV Decel Time: 187 msec     Systemic VTI:  0.22 m MV E velocity: 104.00 cm/s  Systemic Diam: 2.00 cm MV A velocity: 128.00 cm/s MV E/A ratio:  0.81  Zoila Shutter MD Electronically signed by  Zoila Shutter MD Signature Date/Time: 07/17/2022/8:32:48 PM    Final             EKG:  No EKG today.   Recent Labs: 04/25/2022: B Natriuretic Peptide 1,049.7 05/02/2022: Magnesium 2.2 06/14/2022: ALT 29; BUN 19; Creatinine 1.14; Platelet Count 224; Potassium 4.3; Sodium 142 07/24/2022: Hemoglobin 10.5  Recent Lipid Panel    Component Value Date/Time   CHOL 162 12/21/2020 0826   TRIG 210 (H) 12/21/2020 0826   HDL 64 12/21/2020 0826   CHOLHDL 2.5 12/21/2020 0826   CHOLHDL 8.4 09/23/2020 1253   VLDL UNABLE TO CALCULATE IF TRIGLYCERIDE OVER 400 mg/dL 40/98/1191 4782   LDLCALC 64 12/21/2020 0826   LDLCALC 77 07/04/2018 1017   LDLDIRECT 141.2 (H) 09/23/2020 1253    Home Medications   Current Meds  Medication Sig   acetaminophen (TYLENOL) 650 MG CR tablet Take 1,300 mg by mouth daily as needed for pain.   acetic acid-hydrocortisone (VOSOL-HC) OTIC solution Place 4 drops into both ears See admin instructions. Instill 4 drops into both ears the first 5 days of the month, then as directed/as needed for itching   albuterol (VENTOLIN HFA) 108 (90 Base) MCG/ACT inhaler Inhale 2 puffs into the lungs every 6 (six) hours as needed for wheezing or shortness of breath.   allopurinol (ZYLOPRIM) 100 MG tablet TAKE 1 TABLET BY MOUTH DAILY (Patient taking differently: Take 100 mg by mouth daily with supper.)   Alpha-D-Galactosidase (BEANO PO) Take 2 tablets by mouth 3 (three) times daily before meals.   Alpha-Lipoic Acid 600 MG TABS Take 600 mg by mouth daily.   ammonium lactate  (AMLACTIN DAILY) 12 % lotion Apply 1 Application topically as needed for dry skin.   atorvastatin (LIPITOR) 80 MG tablet TAKE 1 TABLET BY MOUTH DAILY (Patient taking differently: Take 80 mg by mouth daily with supper.)   azelastine (ASTELIN) 0.1 % nasal spray Place 2 sprays into both nostrils daily. Use in each nostril as directed   benzonatate (TESSALON) 200 MG capsule TAKE 1 CAPSULE BY MOUTH THREE TIMES DAILY AS NEEDED FOR COUGH (Patient taking differently: Take 200 mg by mouth 2 (two) times daily as needed for cough.)   budesonide (PULMICORT) 0.5 MG/2ML nebulizer solution Take 2 mLs (0.5 mg total) by nebulization 2 (two) times daily.   Calcium-Magnesium-Vitamin D (CALCIUM MAGNESIUM PO) Take 1 tablet by mouth at bedtime.   cetirizine (ZYRTEC) 10 MG tablet Take 1 tablet (10 mg total) by mouth 2 (two) times daily as needed for allergies (Can take a n extra dose during flare ups.). (Patient taking differently: Take 10 mg by mouth at bedtime.)   Cholecalciferol (VITAMIN D3) 25 MCG (1000 UT) capsule Take 1,000 Units by mouth at bedtime.   clopidogrel (PLAVIX) 75 MG tablet Take 1 tablet (75 mg total) by mouth daily.   cyproheptadine (PERIACTIN) 4 MG tablet Take 12 mg by mouth at bedtime as needed (to treat facial pain or sleep dysfunction).   denosumab (PROLIA) 60 MG/ML SOSY injection Inject 60 mg into the skin every 6 (six) months.   dexlansoprazole (DEXILANT) 60 MG capsule Take 1 capsule (60 mg total) by mouth daily.   empagliflozin (JARDIANCE) 10 MG TABS tablet Take 1 tablet (10 mg total) by mouth daily before breakfast.   EPINEPHrine 0.3 mg/0.3 mL IJ SOAJ injection USE AS DIRECTED BY YOUR PHYSICIAN INTRAMUSCULARLY AS NEEDED FOR ANAPHYLAXIS (Patient taking differently: Inject 0.3 mg into the muscle as needed for anaphylaxis.)   famotidine (PEPCID) 20 MG tablet  Take 1 tablet (20 mg total) by mouth daily. TAKE 1 TABLET BY MOUTH EACH NIGHT AT BEDTIME Strength: 20 mg   fluticasone (FLONASE) 50 MCG/ACT  nasal spray USE 2 SPRAYS IN EACH NOSTRIL DAILY (Patient taking differently: Place 2 sprays into both nostrils daily.)   folic acid (FOLVITE) 1 MG tablet Take 1 tablet (1 mg total) by mouth daily.   furosemide (LASIX) 40 MG tablet DAILY AS NEEDED FOR WEIGHT GAIN OF 2 POUNDS OVERNIGHT OR 5 POUNDS IN ONE WEEK   gabapentin (NEURONTIN) 300 MG capsule Take 1 capsule (300 mg total) by mouth at bedtime.   GOODSENSE ARTHRITIS PAIN 1 % GEL Apply 1 Application topically 4 times daily as needed for pain.   guaiFENesin (MUCINEX) 600 MG 12 hr tablet Take 1 tablet (600 mg total) by mouth 2 (two) times daily as needed for cough or to loosen phlegm. (Patient taking differently: Take 600 mg by mouth 2 (two) times daily.)   HIZENTRA 10 GM/50ML SOLN Inject into the skin.   ipratropium-albuterol (DUONEB) 0.5-2.5 (3) MG/3ML SOLN Take 3 mLs by nebulization every 6 (six) hours as needed.   isosorbide-hydrALAZINE (BIDIL) 20-37.5 MG tablet Take 1 tablet by mouth 2 (two) times daily.   Lactase (LACTOSE FAST ACTING RELIEF PO) Take 3 tablets by mouth 3 (three) times daily before meals.   lidocaine-prilocaine (EMLA) cream Apply 1 application  topically as needed (pain).   magnesium oxide (MAG-OX) 400 (240 Mg) MG tablet Take 400 mg by mouth at bedtime.   Menthol, Topical Analgesic, (BIOFREEZE) 4 % GEL Apply 1 application  topically daily as needed (for shoulder or joint pain).   mirtazapine (REMERON SOL-TAB) 30 MG disintegrating tablet Take 1 tablet (30 mg total) by mouth at bedtime as needed (sleep). (Patient taking differently: Take 30 mg by mouth at bedtime.)   montelukast (SINGULAIR) 10 MG tablet Take 1 tablet (10 mg total) by mouth daily after supper.   neomycin-polymyxin-hydrocortisone (CORTISPORIN) 3.5-10000-1 OTIC suspension Place into both ears 4 (four) times daily.   Nystatin (GERHARDT'S BUTT CREAM) CREA Apply topically 3 times a day as needed for itching or irritation   nystatin (MYCOSTATIN) 100000 UNIT/ML suspension  Use as directed 15 mLs in the mouth or throat at bedtime.   potassium chloride (KLOR-CON M) 10 MEQ tablet DAILY AS NEEDED WHEN TAKING LASIX   potassium chloride (KLOR-CON) 10 MEQ tablet Take 10 mEq by mouth daily as needed.   predniSONE (DELTASONE) 1 MG tablet Take 2 tablets (2 mg total) by mouth daily with breakfast. Take along with 5 mg tablet daily.   predniSONE (DELTASONE) 5 MG tablet Take 1 tablet (5 mg total) by mouth daily with breakfast.   Prenatal Vit-Fe Fumarate-FA (PRENATAL VITAMINS PO) Take 1 tablet by mouth daily.   Probiotic Product (ALIGN PO) Take 1 capsule by mouth daily.   Propylene Glycol (SYSTANE COMPLETE) 0.6 % SOLN Place 1 drop into both eyes 2 (two) times daily.   sacubitril-valsartan (ENTRESTO) 24-26 MG Take 1 tablet by mouth 2 (two) times daily.   Tiotropium Bromide-Olodaterol (STIOLTO RESPIMAT) 2.5-2.5 MCG/ACT AERS Inhale 2 puffs into the lungs daily.   traMADol (ULTRAM) 50 MG tablet Take 1 tablet (50 mg total) by mouth at bedtime.   Triamcinolone Acetonide (TRIAMCINOLONE 0.1 % CREAM : EUCERIN) CREA Apply 1 Application topically 2 (two) times daily.   triamcinolone ointment (KENALOG) 0.5 % Apply 1 Application topically 2 (two) times daily.   valsartan (DIOVAN) 40 MG tablet Take 40 mg by mouth daily.  XARELTO 20 MG TABS tablet TAKE 1 TABLET BY MOUTH DAILY WITH SUPPER (Patient taking differently: Take 20 mg by mouth daily with supper.)     Review of Systems      All other systems reviewed and are otherwise negative except as noted above.  Physical Exam    VS:  BP (!) 140/64   Pulse 82   Ht 5\' 1"  (1.549 m)   Wt 142 lb (64.4 kg)   SpO2 94%   BMI 26.83 kg/m  , BMI Body mass index is 26.83 kg/m.  Wt Readings from Last 3 Encounters:  08/07/22 142 lb (64.4 kg)  07/24/22 131 lb 11.2 oz (59.7 kg)  07/20/22 133 lb (60.3 kg)   GEN: Well nourished, well developed, in no acute distress. HEENT: normal. Neck: Supple, no JVD, carotid bruits, or masses. Cardiac: RRR,  no murmurs, rubs, or gallops. No clubbing, cyanosis, edema.  Radials/PT 2+ and equal bilaterally.  Respiratory:  Respirations regular and unlabored, clear to auscultation bilaterally. GI: Soft, nontender, nondistended. MS: No deformity or atrophy. Skin: Warm and dry, no rash.  Neuro:  Strength and sensation are intact. Psych: Normal affect.  Assessment & Plan    CAD - 01/11/21 s/p DES PCI of LAD.  GDMT includes plavix, atorvastatin. Heart healthy diet and regular cardiovascular exercise encouraged.    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 06/14/22 wbc 10.1.    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 [facility] 01/11/22 DES-RCA.  Echo 05/2021 LVF 30-35%.  Echo *** Update echo. If LVEF <35% refer to EP for consideration of ICD10.  Daily weight, low salt diet, fluid restriction <2 L encouraged.  GDMT includes Jardiance 10mg  QD, Lasix 40mg  PRN, Entresto 24-26mg  BID, Bidil 20-37.5mg  BID. She is taking Bidil and Entresto only QD, reiterated need to take BID.  DM2 - Continue to follow with PCP.   ZOX0R - Careful titration of diuretic and antihypertensive.    LDL goal <70 -Continue Atorvastatin 80mg  QD. Due for lipid panel at follow up  Hypokalemia - Continue potassium supplement.    Disposition: Follow up in 3 months with Jodelle Red, MD or APP.  Signed, Alver Sorrow, NP 08/07/2022, 1:56 PM [facility]

## 2022-08-08 ENCOUNTER — Telehealth: Payer: Self-pay | Admitting: Allergy and Immunology

## 2022-08-08 ENCOUNTER — Other Ambulatory Visit: Payer: Self-pay

## 2022-08-08 ENCOUNTER — Other Ambulatory Visit: Payer: Self-pay | Admitting: Allergy and Immunology

## 2022-08-08 ENCOUNTER — Encounter (HOSPITAL_BASED_OUTPATIENT_CLINIC_OR_DEPARTMENT_OTHER): Payer: Self-pay | Admitting: Family

## 2022-08-08 ENCOUNTER — Telehealth: Payer: Self-pay

## 2022-08-08 ENCOUNTER — Encounter: Payer: Self-pay | Admitting: Allergy and Immunology

## 2022-08-08 DIAGNOSIS — M810 Age-related osteoporosis without current pathological fracture: Secondary | ICD-10-CM

## 2022-08-08 DIAGNOSIS — E0500 Thyrotoxicosis with diffuse goiter without thyrotoxic crisis or storm: Secondary | ICD-10-CM

## 2022-08-08 LAB — COMPREHENSIVE METABOLIC PANEL
ALT: 55 IU/L — ABNORMAL HIGH (ref 0–32)
AST: 44 IU/L — ABNORMAL HIGH (ref 0–40)
Albumin: 3.6 g/dL — ABNORMAL LOW (ref 3.8–4.8)
Alkaline Phosphatase: 71 IU/L (ref 44–121)
BUN/Creatinine Ratio: 22 (ref 12–28)
BUN: 25 mg/dL (ref 8–27)
Bilirubin Total: 0.4 mg/dL (ref 0.0–1.2)
CO2: 20 mmol/L (ref 20–29)
Calcium: 8.7 mg/dL (ref 8.7–10.3)
Chloride: 106 mmol/L (ref 96–106)
Creatinine, Ser: 1.13 mg/dL — ABNORMAL HIGH (ref 0.57–1.00)
Globulin, Total: 2.3 g/dL (ref 1.5–4.5)
Glucose: 152 mg/dL — ABNORMAL HIGH (ref 70–99)
Potassium: 4.8 mmol/L (ref 3.5–5.2)
Sodium: 141 mmol/L (ref 134–144)
Total Protein: 5.9 g/dL — ABNORMAL LOW (ref 6.0–8.5)
eGFR: 51 mL/min/{1.73_m2} — ABNORMAL LOW (ref >59)

## 2022-08-08 LAB — LDL CHOLESTEROL, DIRECT: LDL Direct: 48 mg/dL (ref 0–99)

## 2022-08-08 LAB — BRAIN NATRIURETIC PEPTIDE: BNP: 646.5 pg/mL — ABNORMAL HIGH (ref 0.0–100.0)

## 2022-08-08 NOTE — Telephone Encounter (Signed)
Patient is requesting for a new prescription for periactin as Dr. Lucie Leather increased the dosage for patient. Patient states she will be out with the increase in dosage.   [facility] - [address] [place], [place] [address]  Best contact number: [phone]

## 2022-08-08 NOTE — Telephone Encounter (Signed)
Pt called to request a refill of traMADol (ULTRAM) 50 MG tablet [[id number]].  LOV: 07/19/22  PHARMACY: [facility] - [place], [place] - [address] [place] [address] [place] [address] Phone: [phone]  Fax: [phone]    CB#: [phone]

## 2022-08-08 NOTE — Telephone Encounter (Signed)
New prescription has been sent in. Called patient and advised, patient verbalized understanding.  

## 2022-08-09 ENCOUNTER — Other Ambulatory Visit: Payer: Self-pay | Admitting: Family Medicine

## 2022-08-09 DIAGNOSIS — M19011 Primary osteoarthritis, right shoulder: Secondary | ICD-10-CM

## 2022-08-09 DIAGNOSIS — M1120 Other chondrocalcinosis, unspecified site: Secondary | ICD-10-CM

## 2022-08-09 MED ORDER — TRAMADOL HCL 50 MG PO TABS
50.0000 mg | ORAL_TABLET | Freq: Every day | ORAL | 0 refills | Status: AC
Start: 2022-08-09 — End: ?

## 2022-08-10 ENCOUNTER — Telehealth (HOSPITAL_BASED_OUTPATIENT_CLINIC_OR_DEPARTMENT_OTHER): Payer: Self-pay | Admitting: Cardiology

## 2022-08-10 MED ORDER — CYPROHEPTADINE HCL 4 MG PO TABS: 4.0000 mg | ORAL_TABLET | Freq: Three times a day (TID) | ORAL | 5 refills | Status: DC | PRN

## 2022-08-10 NOTE — Addendum Note (Signed)
Addended by: Orson Aloe on: 08/10/2022 04:42 PM   Modules accepted: Orders

## 2022-08-10 NOTE — Telephone Encounter (Signed)
*  STAT* If patient is at the pharmacy, call can be transferred to refill team.   1. Which medications need to be refilled? (please list name of each medication and dose if known)   sacubitril-valsartan (ENTRESTO) 24-26 MG   2. Which pharmacy/location (including street and city if local pharmacy) is medication to be sent to?  [facility] - [place], [place] - [address] [address]   3. Do they need a 30 day or 90 day supply?   90 day  Patient stated she only has a couple of days left of this medication.

## 2022-08-10 NOTE — Telephone Encounter (Signed)
Patient called stating pharmacy does not have new prescription. Pharmacy still has the old prescription for 2 tablets a day. Patient is requesting prescription be sent in again.   Best contact number: [phone]

## 2022-08-10 NOTE — Telephone Encounter (Signed)
Periactin 4 mg tablet  3 tablets at bedtime has been sent in patient has been notified through voicemail.

## 2022-08-13 ENCOUNTER — Telehealth (HOSPITAL_BASED_OUTPATIENT_CLINIC_OR_DEPARTMENT_OTHER): Payer: Self-pay

## 2022-08-13 MED ORDER — SACUBITRIL-VALSARTAN 24-26 MG PO TABS: 1.0000 | ORAL_TABLET | Freq: Two times a day (BID) | ORAL | 1 refills | Status: DC

## 2022-08-13 NOTE — Telephone Encounter (Signed)
Patient returned call, transferred from call center, call disconnected. Returned call, reviewed results, patient verbalizes understanding and will do 2-3 days of lasix.

## 2022-08-13 NOTE — Telephone Encounter (Signed)
Rx request sent to pharmacy.  

## 2022-08-13 NOTE — Telephone Encounter (Addendum)
Left message for patient to call back   ----- Message from Alver Sorrow, NP sent at 08/08/2022  8:54 PM EDT ----- Stable kidney function. Liver enzymes mildly elevated - ensure avoiding alcohol, acetaminophen (tylenol), fried foods. BNP reveals volume overload. Direct LDL (bad cholesterol) at goal <70.   Volume overload was noted in clinic and she was recommended to utilize her PRN Lasix for 2-3 days. Has upcoming labs in August with PCP, Dr. Tanya Nones, will CC him to see if liver enzymes can be repeated at that time for monitoring.

## 2022-08-14 ENCOUNTER — Telehealth: Payer: Self-pay | Admitting: Cardiology

## 2022-08-14 ENCOUNTER — Ambulatory Visit
Admission: RE | Admit: 2022-08-14 | Discharge: 2022-08-14 | Disposition: A | Discharge status: Home / Self Care | Payer: Medicare PPO | Source: Ambulatory Visit | Attending: Orthopedic Surgery | Admitting: Orthopedic Surgery

## 2022-08-14 DIAGNOSIS — S42202A Unspecified fracture of upper end of left humerus, initial encounter for closed fracture: Secondary | ICD-10-CM | POA: Diagnosis not present

## 2022-08-14 DIAGNOSIS — Z01818 Encounter for other preprocedural examination: Secondary | ICD-10-CM

## 2022-08-14 NOTE — Telephone Encounter (Signed)
Returned call to patient, explained need for referral and previous discussion at 6/18 office visit. Patient verbalizes understanding and is agreeable to keeping appointment with Dr. Nelly Laurence.

## 2022-08-14 NOTE — Telephone Encounter (Signed)
Patient is calling to see why she has appt with dr Nelly Laurence. She knew nothing about possibly needing a pacer maker. Please advise

## 2022-08-15 ENCOUNTER — Telehealth: Payer: Self-pay | Admitting: Pharmacist

## 2022-08-15 ENCOUNTER — Other Ambulatory Visit: Payer: Medicare PPO | Admitting: Pharmacist

## 2022-08-15 DIAGNOSIS — J471 Bronchiectasis with (acute) exacerbation: Secondary | ICD-10-CM | POA: Diagnosis not present

## 2022-08-15 DIAGNOSIS — G4736 Sleep related hypoventilation in conditions classified elsewhere: Secondary | ICD-10-CM | POA: Diagnosis not present

## 2022-08-15 DIAGNOSIS — R0602 Shortness of breath: Secondary | ICD-10-CM | POA: Diagnosis not present

## 2022-08-15 DIAGNOSIS — M0600 Rheumatoid arthritis without rheumatoid factor, unspecified site: Secondary | ICD-10-CM | POA: Diagnosis not present

## 2022-08-15 DIAGNOSIS — G894 Chronic pain syndrome: Secondary | ICD-10-CM | POA: Diagnosis not present

## 2022-08-15 DIAGNOSIS — G4733 Obstructive sleep apnea (adult) (pediatric): Secondary | ICD-10-CM | POA: Diagnosis not present

## 2022-08-15 DIAGNOSIS — I5032 Chronic diastolic (congestive) heart failure: Secondary | ICD-10-CM | POA: Diagnosis not present

## 2022-08-15 DIAGNOSIS — J45909 Unspecified asthma, uncomplicated: Secondary | ICD-10-CM | POA: Diagnosis not present

## 2022-08-15 DIAGNOSIS — J4540 Moderate persistent asthma, uncomplicated: Secondary | ICD-10-CM | POA: Diagnosis not present

## 2022-08-15 NOTE — Progress Notes (Signed)
08/15/2022 Name: Ebony Scott MRN: [id number] DOB: [birth year]  Osteoporosis -consulted for Prolia restart (has been off Prolia >1 yr)  Subjective:  Medication Access/Adherence  Current Pharmacy:  [facility] - [place], [place] - [address] [place] [address] [address] [place] [address] Phone: [phone] Fax: [phone]   Per Dexa reports 72 year old postmenopausal female with history of asthma, hyperthyroidism, breast cancer and osteoporosis. Remote history of surgery at C3-4 and C5-6. She has been on Reclast in the past and prolia. Of note, she has taken Tamoxifen in the past. She takes vitamin D and calcium to support her bone health.  Objective:  Lab Results  Component Value Date   HGBA1C 6.3 (H) 05/29/2022    Lab Results  Component Value Date   CREATININE 1.13 (H) 08/07/2022   BUN 25 08/07/2022   NA 141 08/07/2022   K 4.8 08/07/2022   CL 106 08/07/2022   CO2 20 08/07/2022    Lab Results  Component Value Date   CHOL 162 12/21/2020   HDL 64 12/21/2020   LDLCALC 64 12/21/2020   LDLDIRECT 48 08/07/2022   TRIG 210 (H) 12/21/2020   CHOLHDL 2.5 12/21/2020    Medications Reviewed Today     Reviewed by Alver Sorrow, NP (Nurse Practitioner) on 08/08/22 at 2054  Med List Status: <None>   Medication Order Taking? Sig Documenting Provider Last Dose Status Informant  acetaminophen (TYLENOL) 650 MG CR tablet [id number] Yes Take 1,300 mg by mouth daily as needed for pain. [provider] Taking Active Family Member  acetic acid-hydrocortisone (VOSOL-HC) OTIC solution [id number] Yes Place 4 drops into both ears See admin instructions. Instill 4 drops into both ears the first 5 days of the month, then as directed/as needed for itching [provider] Taking Active Family Member  albuterol (VENTOLIN HFA) 108 (90 Base) MCG/ACT inhaler [id number] Yes Inhale 2 puffs into the lungs every 6 (six) hours as needed for  wheezing or shortness of breath. Kozlow, [name], MD Taking Active Family Member  allopurinol (ZYLOPRIM) 100 MG tablet [id number] Yes TAKE 1 TABLET BY MOUTH DAILY  Patient taking differently: Take 100 mg by mouth daily with supper.   [name], MD Taking Active Family Member  Alpha-D-Galactosidase [employer] PO) [id number] Yes Take 2 tablets by mouth 3 (three) times daily before meals. [provider] Taking Active Family Member  Alpha-Lipoic Acid 600 MG TABS [id number] Yes Take 600 mg by mouth daily. [provider] Taking Active Family Member  ammonium lactate (AMLACTIN DAILY) 12 % lotion [id number] Yes Apply 1 Application topically as needed for dry skin. Candelaria Stagers, DPM Taking Active Family Member  atorvastatin (LIPITOR) 80 MG tablet [id number] Yes TAKE 1 TABLET BY MOUTH DAILY  Patient taking differently: Take 80 mg by mouth daily with supper.   [name], MD Taking Active Family Member  azelastine (ASTELIN) 0.1 % nasal spray [id number] Yes Place 2 sprays into both nostrils daily. Use in each nostril as directed Kozlow, [name], MD Taking Active Family Member  benzonatate (TESSALON) 200 MG capsule [id number] Yes TAKE 1 CAPSULE BY MOUTH THREE TIMES DAILY AS NEEDED FOR COUGH  Patient taking differently: Take 200 mg by mouth 2 (two) times daily as needed for cough.   [name], MD Taking Active Family Member  budesonide (PULMICORT) 0.5 MG/2ML nebulizer solution [id number] Yes Take 2 mLs (0.5 mg total) by nebulization 2 (two) times daily. Martina Sinner, MD Taking Active  Calcium-Magnesium-Vitamin D (CALCIUM MAGNESIUM PO) [id number] Yes Take 1 tablet by mouth at bedtime. [provider] Taking Active Family Member  cetirizine (ZYRTEC) 10 MG tablet [id number] Yes Take 1 tablet (10 mg total) by mouth 2 (two) times daily as needed for allergies (Can take a n extra dose during flare ups.).  Patient taking differently: Take 10 mg by mouth at bedtime.   Kozlow,  [name], MD Taking Active Family Member  Cholecalciferol (VITAMIN D3) 25 MCG (1000 UT) capsule [id number] Yes Take 1,000 Units by mouth at bedtime. [provider] Taking Active Family Member  clopidogrel (PLAVIX) 75 MG tablet [id number] Yes Take 1 tablet (75 mg total) by mouth daily. Jodelle Red, MD Taking Active   cyproheptadine (PERIACTIN) 4 MG tablet [id number] Yes Take 12 mg by mouth at bedtime as needed (to treat facial pain or sleep dysfunction). [provider] Taking Active   cyproheptadine (PERIACTIN) 4 MG tablet [id number]  Take 3 tablets (12 mg total) by mouth at bedtime. Kozlow, [name], MD  Active   denosumab (PROLIA) 60 MG/ML SOSY injection [id number] Yes Inject 60 mg into the skin every 6 (six) months. [provider] Taking Active Family Member  empagliflozin (JARDIANCE) 10 MG TABS tablet [id number] Yes Take 1 tablet (10 mg total) by mouth daily before breakfast. [name], MD Taking Active Family Member  EPINEPHrine 0.3 mg/0.3 mL IJ SOAJ injection [id number] Yes USE AS DIRECTED BY YOUR PHYSICIAN INTRAMUSCULARLY AS NEEDED FOR ANAPHYLAXIS  Patient taking differently: Inject 0.3 mg into the muscle as needed for anaphylaxis.   Kozlow, [name], MD Taking Active Family Member  famotidine (PEPCID) 20 MG tablet [id number] Yes Take 1 tablet (20 mg total) by mouth daily. TAKE 1 TABLET BY MOUTH EACH NIGHT AT BEDTIME Strength: 20 mg Kozlow, [name], MD Taking Active   fluticasone [facility]) 50 MCG/ACT nasal spray [id number] Yes USE 2 SPRAYS IN EACH NOSTRIL DAILY  Patient taking differently: Place 2 sprays into both nostrils daily.   Martina Sinner, MD Taking Active Family Member  folic acid (FOLVITE) 1 MG tablet [id number] Yes Take 1 tablet (1 mg total) by mouth daily. [name], MD Taking Active Family Member  furosemide (LASIX) 40 MG tablet [id number] Yes DAILY AS NEEDED FOR WEIGHT GAIN OF 2 POUNDS OVERNIGHT OR 5 POUNDS IN ONE WEEK Alver Sorrow,  NP Taking Active   gabapentin (NEURONTIN) 300 MG capsule [id number] Yes Take 1 capsule (300 mg total) by mouth at bedtime. Lewie Chamber, MD Taking Active   GOODSENSE ARTHRITIS PAIN 1 % GEL [id number] Yes Apply 1 Application topically 4 times daily as needed for pain. [name], MD Taking Active   guaiFENesin (MUCINEX) 600 MG 12 hr tablet [id number] Yes Take 1 tablet (600 mg total) by mouth 2 (two) times daily as needed for cough or to loosen phlegm.  Patient taking differently: Take 600 mg by mouth 2 (two) times daily.   [name], MD Taking Active Family Member  HIZENTRA 10 GM/50ML Criss Rosales [id number] Yes Inject into the skin. [provider] Taking Active   iohexol (OMNIPAQUE) 350 MG/ML injection [id number]   Marykay Lex, MD  Active   ipratropium-albuterol (DUONEB) 0.5-2.5 (3) MG/3ML Criss Rosales [id number] Yes Take 3 mLs by nebulization every 6 (six) hours as needed. Martina Sinner, MD Taking Active Family Member  isosorbide-hydrALAZINE (BIDIL) 20-37.5 MG tablet [id number] Yes Take 1 tablet by mouth 2 (two) times daily. Alver Sorrow, NP Taking Active   Lactase (  LACTOSE FAST ACTING RELIEF PO) [id number] Yes Take 3 tablets by mouth 3 (three) times daily before meals. [provider] Taking Active Family Member  lidocaine-prilocaine (EMLA) cream [id number] Yes Apply 1 application  topically as needed (pain). [provider] Taking Active Family Member  magnesium oxide (MAG-OX) 400 (240 Mg) MG tablet [id number] Yes Take 400 mg by mouth at bedtime. [provider] Taking Active Family Member  Menthol, Topical Analgesic, (BIOFREEZE) 4 % GEL [id number] Yes Apply 1 application  topically daily as needed (for shoulder or joint pain). [provider] Taking Active Family Member  mirtazapine (REMERON SOL-TAB) 30 MG disintegrating tablet [id number] Yes Take 1 tablet (30 mg total) by mouth at bedtime as needed (sleep).  Patient taking differently: Take 30 mg  by mouth at bedtime.   [name], MD Taking Active Family Member  montelukast (SINGULAIR) 10 MG tablet [id number] Yes Take 1 tablet (10 mg total) by mouth daily after supper. [name], MD Taking Active   neomycin-polymyxin-hydrocortisone (CORTISPORIN) 3.5-10000-1 OTIC suspension [id number] Yes Place into both ears 4 (four) times daily. [provider] Taking Active   Nystatin (GERHARDT'S BUTT CREAM) CREA [id number] Yes Apply topically 3 times a day as needed for itching or irritation [name], MD Taking Active Family Member  nystatin (MYCOSTATIN) 100000 UNIT/ML suspension [id number] Yes Use as directed 15 mLs in the mouth or throat at bedtime. [provider] Taking Active Family Member  potassium chloride (KLOR-CON M) 10 MEQ tablet [id number] Yes DAILY AS NEEDED WHEN TAKING LASIX Alver Sorrow, NP Taking Active   predniSONE (DELTASONE) 1 MG tablet [id number] Yes Take 2 tablets (2 mg total) by mouth daily with breakfast. Take along with 5 mg tablet daily. Fuller Plan, MD Taking Active   predniSONE (DELTASONE) 5 MG tablet [id number] Yes Take 1 tablet (5 mg total) by mouth daily with breakfast. Fuller Plan, MD Taking Active   Prenatal Vit-Fe Fumarate-FA (PRENATAL VITAMINS PO) [id number] Yes Take 1 tablet by mouth daily. [provider] Taking Active Family Member  Probiotic Product (ALIGN PO) [id number] Yes Take 1 capsule by mouth daily. [provider] Taking Active Family Member  Propylene Glycol (SYSTANE COMPLETE) 0.6 % SOLN [id number] Yes Place 1 drop into both eyes 2 (two) times daily. Kozlow, [name], MD Taking Active Family Member  sacubitril-valsartan [facility]) 24-26 [place] [id number] Yes Take 1 tablet by mouth 2 (two) times daily. Jodelle Red, MD Taking Active   Tiotropium Bromide-Olodaterol (STIOLTO RESPIMAT) 2.5-2.5 MCG/ACT AERS [id number] Yes Inhale 2 puffs into the lungs daily. Martina Sinner, MD Taking Active    traMADol Janean Sark) 50 MG tablet [id number] Yes Take 1 tablet (50 mg total) by mouth at bedtime. Lewie Chamber, MD Taking Active   Triamcinolone Acetonide (TRIAMCINOLONE 0.1 % CREAM : EUCERIN) CREA [id number] Yes Apply 1 Application topically 2 (two) times daily. [name], MD Taking Active   triamcinolone ointment (KENALOG) 0.5 % [id number] Yes Apply 1 Application topically 2 (two) times daily. Jaci Standard, MD Taking Active   XARELTO 20 MG TABS tablet [id number] Yes TAKE 1 TABLET BY MOUTH DAILY WITH SUPPER  Patient taking differently: Take 20 mg by mouth daily with supper.   [name], MD Taking Active Family Member  Med List Note Gailen Shelter 01/04/21 1640):              Assessment/Plan:   Prolia sent to PA team to determine benefits, but they are unable to  assist due to provider not being on their [employer] portal Appears the patient has been on Prolia since 2014 per chart review, however patient does not recall exactly how long She has been off Prolia for >1 y due to being in & out of facilities Restart Prolia once cleared by Ortho at upcoming appt. Routed Prolia request back to PCP's nurse to assist with Prolia approval & retrieval.    Follow Up Plan: defer to PCP  Kieth Brightly, PharmD, BCACP Clinical Pharmacist, [facility]

## 2022-08-15 NOTE — Telephone Encounter (Signed)
Please run benefits/PA on prolia Patient has been on in the past Resuming therapy--been on prolia since 2014; tried/failed reclast in the past Osteoporosis

## 2022-08-16 NOTE — Telephone Encounter (Signed)
We cannot run benefits through [employer] because she is not under one of our providers, the clinic will need to do. We can help with PA if one is needed.

## 2022-08-17 DIAGNOSIS — S46012A Strain of muscle(s) and tendon(s) of the rotator cuff of left shoulder, initial encounter: Secondary | ICD-10-CM | POA: Diagnosis not present

## 2022-08-20 ENCOUNTER — Ambulatory Visit: Payer: Medicare PPO | Attending: Cardiovascular Disease | Admitting: Cardiovascular Disease

## 2022-08-20 ENCOUNTER — Encounter: Payer: Self-pay | Admitting: Cardiovascular Disease

## 2022-08-20 VITALS — BP 112/56 | HR 77 | Ht 60.0 in | Wt 136.0 lb

## 2022-08-20 DIAGNOSIS — R0602 Shortness of breath: Secondary | ICD-10-CM

## 2022-08-20 DIAGNOSIS — I5020 Unspecified systolic (congestive) heart failure: Secondary | ICD-10-CM

## 2022-08-20 LAB — BASIC METABOLIC PANEL
CO2: 22 mmol/L (ref 20–29)
Chloride: 108 mmol/L — ABNORMAL HIGH (ref 96–106)
Creatinine, Ser: 1.45 mg/dL — ABNORMAL HIGH (ref 0.57–1.00)

## 2022-08-20 LAB — CBC

## 2022-08-20 NOTE — Patient Instructions (Signed)
Medication Instructions:  Your physician recommends that you continue on your current medications as directed. Please refer to the Current Medication list given to you today. *If you need a refill on your cardiac medications before your next appointment, please call your pharmacy*   Lab Work: CBC and BMET Today If you have labs (blood work) drawn today and your tests are completely normal, you will receive your results only by: MyChart Message (if you have MyChart) OR A paper copy in the mail If you have any lab test that is abnormal or we need to change your treatment, we will call you to review the results.   Testing/Procedures: ICD implant Your physician has recommended that you have a defibrillator inserted. An implantable cardioverter defibrillator (ICD) is a small device that is placed in your chest or, in rare cases, your abdomen. This device uses electrical pulses or shocks to help control life-threatening, irregular heartbeats that could lead the heart to suddenly stop beating (sudden cardiac arrest). Leads are attached to the ICD that goes into your heart. This is done in the hospital and usually requires an overnight stay. Please see the instruction sheet given to you today for more information.   Follow-Up: At [facility], you and your health needs are our priority.  As part of our continuing mission to provide you with exceptional heart care, we have created designated Provider Care Teams.  These Care Teams include your primary Cardiologist (physician) and Advanced Practice Providers (APPs -  Physician Assistants and Nurse Practitioners) who all work together to provide you with the care you need, when you need it.  We recommend signing up for the patient portal called "MyChart".  Sign up information is provided on this After Visit Summary.  MyChart is used to connect with patients for Virtual Visits (Telemedicine).  Patients are able to view lab/test results, encounter  notes, upcoming appointments, etc.  Non-urgent messages can be sent to your provider as well.   To learn more about what you can do with MyChart, go to [email or web address].    Your next appointment:   We will schedule follow up after ICD implant  Provider:   York Pellant, MD

## 2022-08-20 NOTE — H&P (View-Only) (Signed)
  Electrophysiology Office Note:    Date:  08/20/2022   ID:  Ebony Scott, DOB 07/27/1950, MRN [id number]  PCP:  [name], Warren T, MD   [facility] Providers Cardiologist:  Bridgette Christopher, MD     Referring MD: Walker, Caitlin S, NP   History of Present Illness:    Ebony Scott is a 72 y.o. female with a complicated medical history significant for CHF with reduced EF, CAD s/p LAD DES in 12/2020, tracheobronchomalacia, DVT, obstructive sleep apnea referred for ICD.     She was diagnosed with heart failure in July 2022 when hospitalized with acute renal failure and acute heart failure.  Echocardiogram showed on ejection fraction of 35 to 40% with wall motion abnormalities. GDMT was limited due to AKI. She has been on        EKGs/Labs/Other Studies Reviewed Today:    Echocardiogram:  07/17/2022 EF 30-35%, with WMA. Moderately dilated LA   Monitors:   Stress testing:    Advanced imaging:   Cardiac catherization    EKG:   EKG Interpretation Date/Time:  Monday August 20 2022 09:50:30 EDT Ventricular Rate:  77 PR Interval:  144 QRS Duration:  90 QT Interval:  404 QTC Calculation: 457 R Axis:   33  Text Interpretation: Sinus rhythm with Premature supraventricular complexes Minimal voltage criteria for LVH, may be normal variant ( Cornell product ) Septal infarct (cited on or before 01-May-2022) When compared with ECG of 04-May-2022 15:25, No significant change was found Confirmed by Danni Leabo ([id number]) on 08/20/2022 10:05:19 AM     Physical Exam:    VS:  BP (!) 112/56   Pulse 77   Ht 5' (1.524 m)   Wt 136 lb (61.7 kg)   SpO2 96%   BMI 26.56 kg/m     Wt Readings from Last 3 Encounters:  08/20/22 136 lb (61.7 kg)  08/07/22 142 lb (64.4 kg)  07/24/22 131 lb 11.2 oz (59.7 kg)     GEN: Well nourished, well developed in no acute distress CARDIAC: RRR, no murmurs, rubs, gallops RESPIRATORY:  Normal work of breathing MUSCULOSKELETAL: no  edema    ASSESSMENT & PLAN:    CHFrEF EF < 35% despite maximum tolerated GDMT She meets criteria for a primary prevention ICD Continued Jardiance 10, Entresto 24-26, vital 20-30 7.5 beta-blocker has been limited, I believe, due to severe asthma. No history of bradycardia per patient and chart review  I discussed the indication for the procedure and the logistics, risks, potential benefit, and after care. I specifically explained that risks include but are not limited to infection, bleeding,damage to blood vessels, lung, and the heart -- but risk of prolonged hospitalization, need for surgery, or the event of stroke, heart attack, or death are low but not zero.   Immune deficient We discussed the increased infection risk   CAD S/p PCI of LAD Continue clopidogrel 75, atorvastatin 80     Signed, Sheli Dorin E Natashia Roseman, MD  08/20/2022 10:10 AM    [facility] 

## 2022-08-20 NOTE — Progress Notes (Signed)
  Electrophysiology Office Note:    Date:  08/20/2022   ID:  Ebony Scott, DOB March 31, 1950, MRN [id number]  PCP:  [name], MD   [facility] Providers Cardiologist:  Jodelle Red, MD     Referring MD: Alver Sorrow, NP   History of Present Illness:    Ebony Scott is a 72 y.o. female with a complicated medical history significant for CHF with reduced EF, CAD s/p LAD DES in 12/2020, tracheobronchomalacia, DVT, obstructive sleep apnea referred for ICD.     She was diagnosed with heart failure in July 2022 when hospitalized with acute renal failure and acute heart failure.  Echocardiogram showed on ejection fraction of 35 to 40% with wall motion abnormalities. GDMT was limited due to AKI. She has been on        EKGs/Labs/Other Studies Reviewed Today:    Echocardiogram:  07/17/2022 EF 30-35%, with WMA. Moderately dilated LA   Monitors:   Stress testing:    Advanced imaging:   Cardiac catherization    EKG:   EKG Interpretation Date/Time:  Monday August 20 2022 09:50:30 EDT Ventricular Rate:  77 PR Interval:  144 QRS Duration:  90 QT Interval:  404 QTC Calculation: 457 R Axis:   33  Text Interpretation: Sinus rhythm with Premature supraventricular complexes Minimal voltage criteria for LVH, may be normal variant ( Cornell product ) Septal infarct (cited on or before 01-May-2022) When compared with ECG of 04-May-2022 15:25, No significant change was found Confirmed by York Pellant [phone]) on 08/20/2022 10:05:19 AM     Physical Exam:    VS:  BP (!) 112/56   Pulse 77   Ht 5' (1.524 m)   Wt 136 lb (61.7 kg)   SpO2 96%   BMI 26.56 kg/m     Wt Readings from Last 3 Encounters:  08/20/22 136 lb (61.7 kg)  08/07/22 142 lb (64.4 kg)  07/24/22 131 lb 11.2 oz (59.7 kg)     GEN: Well nourished, well developed in no acute distress CARDIAC: RRR, no murmurs, rubs, gallops RESPIRATORY:  Normal work of breathing MUSCULOSKELETAL: no  edema    ASSESSMENT & PLAN:    CHFrEF EF < 35% despite maximum tolerated GDMT She meets criteria for a primary prevention ICD Continued Jardiance 10, Entresto 24-26, vital 20-30 7.5 beta-blocker has been limited, I believe, due to severe asthma. No history of bradycardia per patient and chart review  I discussed the indication for the procedure and the logistics, risks, potential benefit, and after care. I specifically explained that risks include but are not limited to infection, bleeding,damage to blood vessels, lung, and the heart -- but risk of prolonged hospitalization, need for surgery, or the event of stroke, heart attack, or death are low but not zero.   Immune deficient We discussed the increased infection risk   CAD S/p PCI of LAD Continue clopidogrel 75, atorvastatin 80     Signed, Maurice Small, MD  08/20/2022 10:10 AM    [facility]

## 2022-08-21 LAB — CBC
Hematocrit: 30.2 % — ABNORMAL LOW (ref 34.0–46.6)
Hemoglobin: 9.9 g/dL — ABNORMAL LOW (ref 11.1–15.9)
MCH: 34.5 pg — ABNORMAL HIGH (ref 26.6–33.0)
MCHC: 32.8 g/dL (ref 31.5–35.7)
MCV: 105 fL — ABNORMAL HIGH (ref 79–97)
Platelets: 207 10*3/uL (ref 150–450)
RDW: 12.9 % (ref 11.7–15.4)
WBC: 9 10*3/uL (ref 3.4–10.8)

## 2022-08-21 LAB — BASIC METABOLIC PANEL WITH GFR
Calcium: 9.3 mg/dL (ref 8.7–10.3)
Potassium: 4.9 mmol/L (ref 3.5–5.2)
Sodium: 143 mmol/L (ref 134–144)

## 2022-08-21 LAB — BASIC METABOLIC PANEL
BUN/Creatinine Ratio: 23 (ref 12–28)
BUN: 33 mg/dL — ABNORMAL HIGH (ref 8–27)
Glucose: 169 mg/dL — ABNORMAL HIGH (ref 70–99)
eGFR: 38 mL/min/{1.73_m2} — ABNORMAL LOW (ref >59)

## 2022-08-22 NOTE — Telephone Encounter (Signed)
I am reaching out to our billing team Jody for clarification if we can initiate via our [employer] website. She is on vacation until Monday, 08/27/22.

## 2022-08-24 NOTE — Pre-Procedure Instructions (Signed)
Instructed patient on the following items: Arrival time 2:30 Nothing to eat or drink after midnight No meds AM of procedure Responsible person to drive you home and stay with you for 24 hrs Wash with special soap night before and morning of procedure If on anti-coagulant drug instructions Xarelto- last dose 7/5

## 2022-08-25 DIAGNOSIS — J4551 Severe persistent asthma with (acute) exacerbation: Secondary | ICD-10-CM | POA: Diagnosis not present

## 2022-08-25 IMAGING — CT CT SHOULDER*R* W/O CM
1 series · 12 of 14 positions shown, 15 images · non-contrast
Comparison: X-ray 12/17/2019, MRI 01/13/2020.  Chest CT 08/28/2019

CLINICAL DATA: Right shoulder pain, limited range of motion.
History of breast cancer. History of rheumatoid arthritis.

EXAM:
CT OF THE UPPER RIGHT EXTREMITY WITHOUT CONTRAST
TECHNIQUE: Multidetector CT imaging of the upper right extremity was performed
according to the standard protocol.

[Series 3: soft tissue · axial · 0.48mm/px · z∈[-257,-87]mm · 12 of 101 slices shown, 15 images]
[im 8/101  soft-tissue]
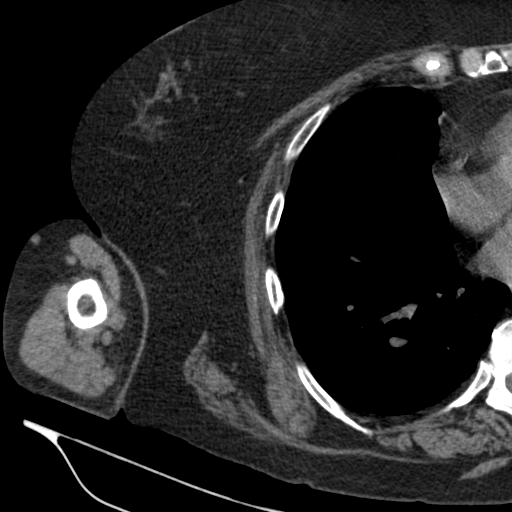
[im 8/101  bone]
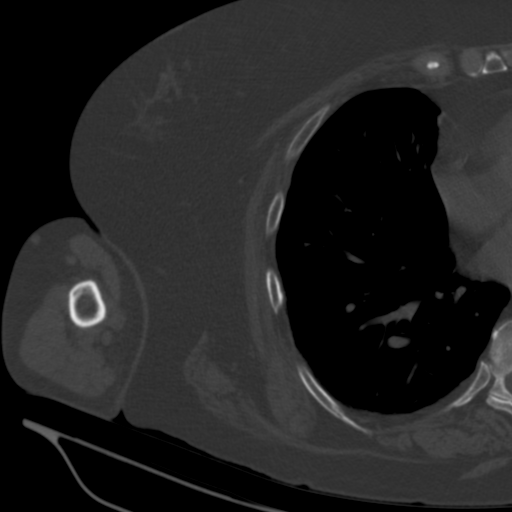
[im 16/101  bone]
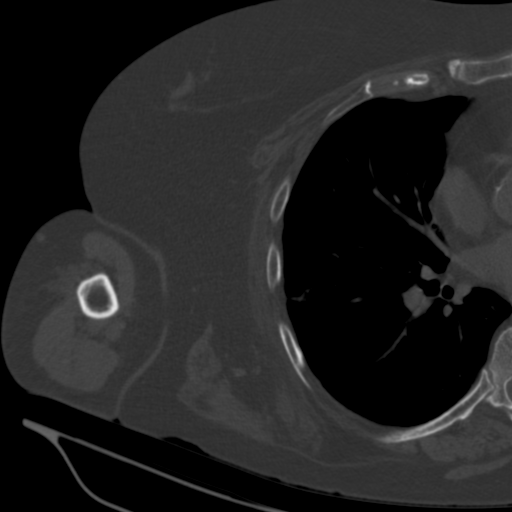
[im 24/101  bone]
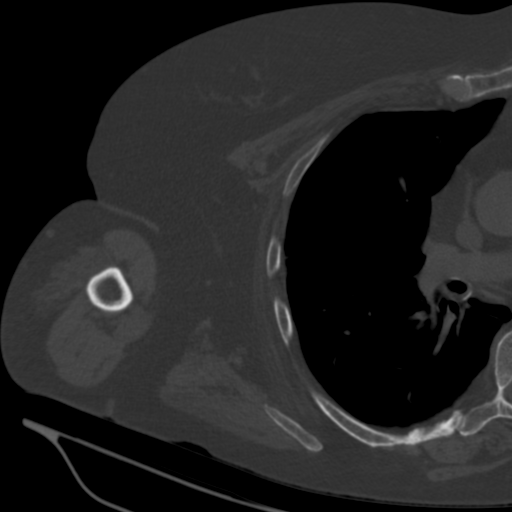
[im 31/101  bone]
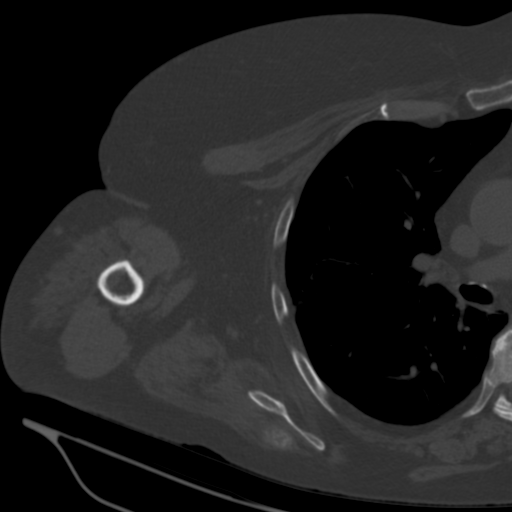
[im 39/101  soft-tissue]
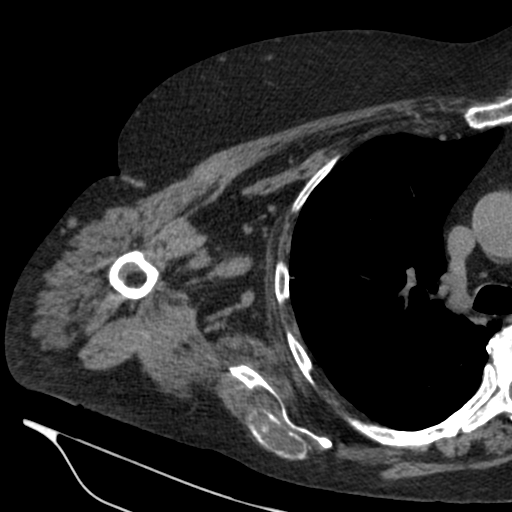
[im 39/101  bone]
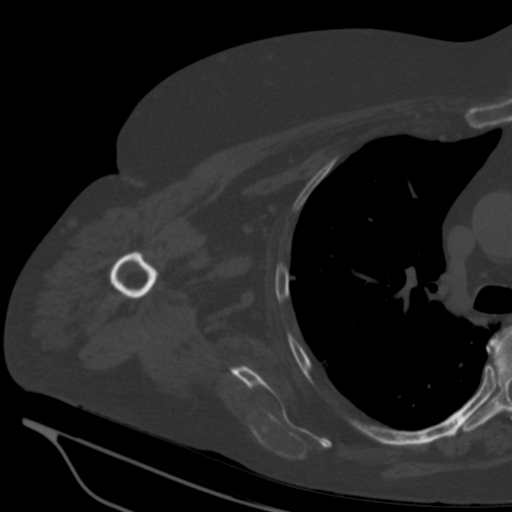
[im 47/101  bone]
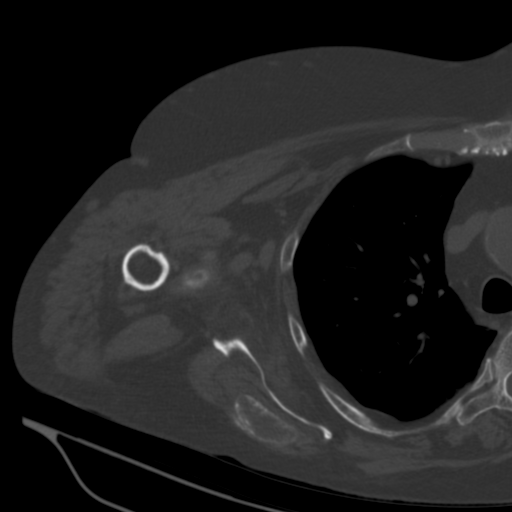
[im 54/101  bone]
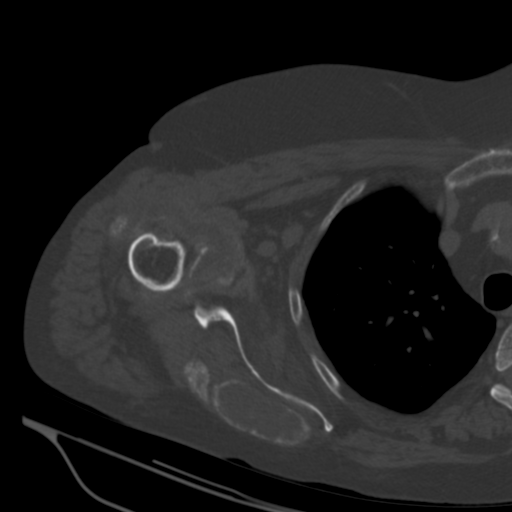
[im 62/101  bone]
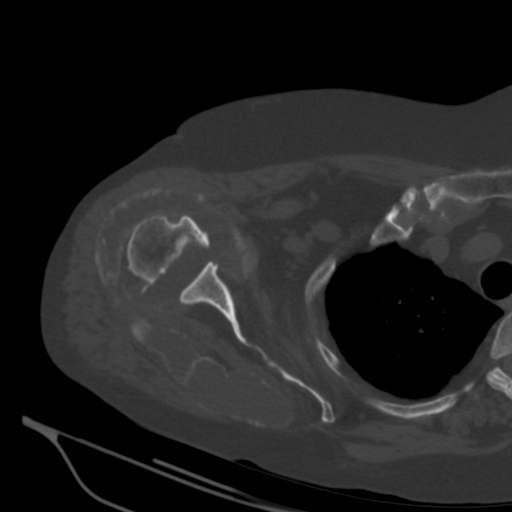
[im 70/101  soft-tissue]
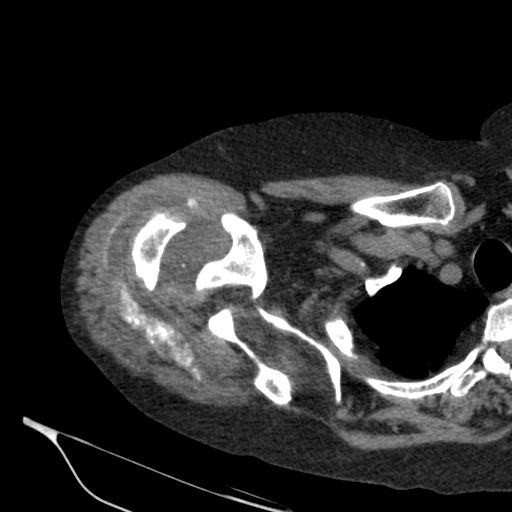
[im 70/101  bone]
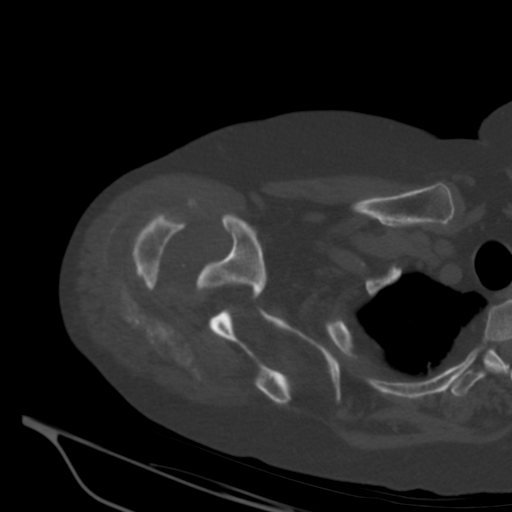
[im 77/101  bone]
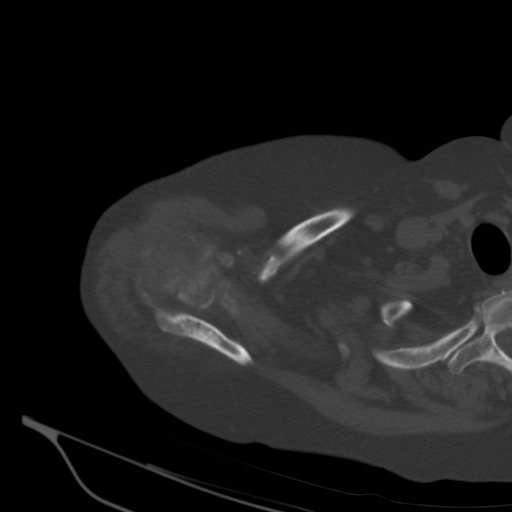
[im 85/101  bone]
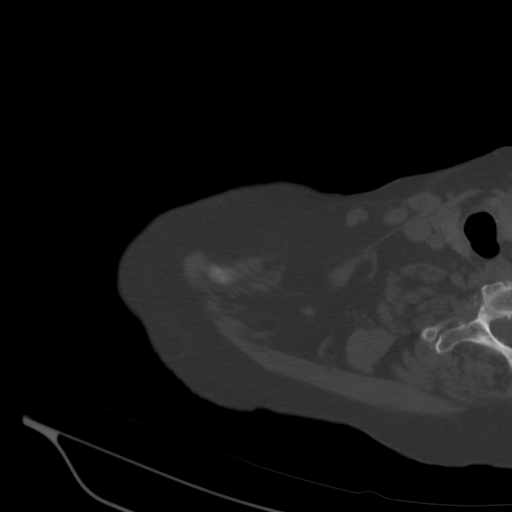
[im 93/101  bone]
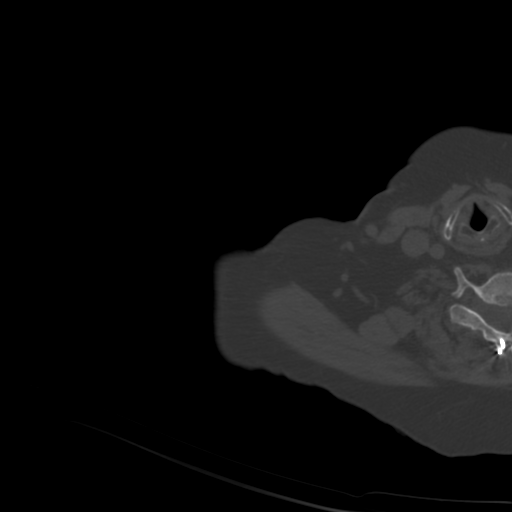

[12 of 14 positions shown; findings below may reference images not displayed]

FINDINGS: Bones/Joint/Cartilage

Marked bone loss of the humeral head with smooth bony remodeling.
Loss of approximately 50% of the bony substance of the humeral head
including the full surface area of the articular surface. There is
also bone loss of the inferior glenoid involving at least 25% of the
inferior glenoid articular surface. Very large glenohumeral joint
effusion with innumerable small mineralized densities. Collection
tracks along the posterior margin of the scapula. Approximate
measurements of the collection measure at least 13 x 7 x 7 cm. There
is a curvilinear fragment of bone within the axillary pouch
measuring 2.0 x 0.5 cm. No evidence of an acute fracture or
dislocation.

Mild degenerative changes of the acromioclavicular joint. There is
fluid distension of the subacromial-subdeltoid bursal space also
containing numerous small mineralized densities. Remaining
visualized osseous structures appear grossly intact.

Ligaments

Suboptimally assessed by CT.

Muscles and Tendons

Fatty atrophy of the subscapularis muscle. Mild fatty infiltration
within the supraspinatus muscle. Known rotator cuff tears are not
well demonstrated by CT.

Soft tissues

No axillary lymphadenopathy. Irregular pleural based pulmonary
nodule in the lateral aspect of the right upper lobe measuring
approximately 8 mm (series 3, image 59). This is new from prior CT
08/28/2019. Scattered surrounding tree-in-bud opacities are present
within this area.
IMPRESSION: 1. Destructive arthropathy of the glenohumeral joint with rapid bone
loss involving the majority of the humeral head and inferior aspect
of the glenoid. Findings may reflect a crystalline arthropathy such
as [REDACTED] shoulder, inflammatory arthropathy such as RA given the
patient's history, neuropathic joint, or less likely septic
arthritis. Arthrocentesis with joint fluid analysis is recommended.
2. Very large glenohumeral joint effusion with innumerable small
mineralized densities. Fluid distension of the
subacromial-subdeltoid bursal space also containing numerous small
mineralized densities.
3. Interval development of irregular pleural based nodule in the
periphery of the right upper lobe measuring 8 mm. Non-contrast chest
CT at 6-12 months is recommended. If the nodule is stable at time of
repeat CT, then future CT at 18-24 months (from today's scan) is
considered optional for low-risk patients, but is recommended for
high-risk patients. This recommendation follows the consensus
statement: Guidelines for Management of Incidental Pulmonary Nodules
Detected on CT Images: From the [HOSPITAL] 8048; Radiology
8048; [DATE].

These results will be called to the ordering clinician or
representative by the Radiologist Assistant, and communication
documented in the PACS or [REDACTED].

## 2022-08-27 ENCOUNTER — Ambulatory Visit (HOSPITAL_COMMUNITY)
Admission: RE | Admit: 2022-08-27 | Discharge: 2022-08-27 | Disposition: A | Discharge status: Home / Self Care | Payer: Medicare PPO | Attending: Cardiovascular Disease | Admitting: Cardiovascular Disease

## 2022-08-27 ENCOUNTER — Other Ambulatory Visit: Payer: Self-pay

## 2022-08-27 ENCOUNTER — Ambulatory Visit (HOSPITAL_COMMUNITY): Payer: Medicare PPO

## 2022-08-27 ENCOUNTER — Ambulatory Visit (HOSPITAL_COMMUNITY)
Admission: RE | Disposition: A | Discharge status: Home / Self Care | Payer: Self-pay | Source: Home / Self Care | Attending: Cardiovascular Disease

## 2022-08-27 DIAGNOSIS — I5020 Unspecified systolic (congestive) heart failure: Secondary | ICD-10-CM

## 2022-08-27 DIAGNOSIS — I5022 Chronic systolic (congestive) heart failure: Secondary | ICD-10-CM | POA: Insufficient documentation

## 2022-08-27 DIAGNOSIS — J398 Other specified diseases of upper respiratory tract: Secondary | ICD-10-CM | POA: Diagnosis not present

## 2022-08-27 DIAGNOSIS — Z7902 Long term (current) use of antithrombotics/antiplatelets: Secondary | ICD-10-CM | POA: Insufficient documentation

## 2022-08-27 DIAGNOSIS — J45909 Unspecified asthma, uncomplicated: Secondary | ICD-10-CM | POA: Insufficient documentation

## 2022-08-27 DIAGNOSIS — I2510 Atherosclerotic heart disease of native coronary artery without angina pectoris: Secondary | ICD-10-CM | POA: Insufficient documentation

## 2022-08-27 DIAGNOSIS — Z86718 Personal history of other venous thrombosis and embolism: Secondary | ICD-10-CM | POA: Insufficient documentation

## 2022-08-27 DIAGNOSIS — Z79899 Other long term (current) drug therapy: Secondary | ICD-10-CM | POA: Diagnosis not present

## 2022-08-27 DIAGNOSIS — D849 Immunodeficiency, unspecified: Secondary | ICD-10-CM | POA: Insufficient documentation

## 2022-08-27 DIAGNOSIS — G4733 Obstructive sleep apnea (adult) (pediatric): Secondary | ICD-10-CM | POA: Diagnosis not present

## 2022-08-27 DIAGNOSIS — I509 Heart failure, unspecified: Secondary | ICD-10-CM

## 2022-08-27 DIAGNOSIS — Z95810 Presence of automatic (implantable) cardiac defibrillator: Secondary | ICD-10-CM | POA: Diagnosis not present

## 2022-08-27 DIAGNOSIS — Z955 Presence of coronary angioplasty implant and graft: Secondary | ICD-10-CM | POA: Diagnosis not present

## 2022-08-27 HISTORY — PX: ICD IMPLANT: EP1208

## 2022-08-27 LAB — GLUCOSE, CAPILLARY: Glucose-Capillary: 84 mg/dL (ref 70–99)

## 2022-08-27 SURGERY — ICD IMPLANT

## 2022-08-27 MED ORDER — FENTANYL CITRATE (PF) 100 MCG/2ML IJ SOLN
INTRAMUSCULAR | Status: AC
  Filled 2022-08-27: qty 2

## 2022-08-27 MED ORDER — DIPHENHYDRAMINE HCL 50 MG/ML IJ SOLN
INTRAMUSCULAR | Status: AC
  Filled 2022-08-27: qty 1

## 2022-08-27 MED ORDER — LIDOCAINE HCL (PF) 1 % IJ SOLN
INTRAMUSCULAR | Status: AC
  Filled 2022-08-27: qty 60

## 2022-08-27 MED ORDER — POVIDONE-IODINE 10 % EX SWAB
2.0000 | Freq: Once | CUTANEOUS | Status: AC
  Administered 2022-08-27: 2 via TOPICAL

## 2022-08-27 MED ORDER — ONDANSETRON HCL 4 MG/2ML IJ SOLN: 4.0000 mg | Freq: Four times a day (QID) | INTRAMUSCULAR | Status: DC | PRN

## 2022-08-27 MED ORDER — FENTANYL CITRATE (PF) 100 MCG/2ML IJ SOLN
INTRAMUSCULAR | Status: DC | PRN
  Administered 2022-08-27: 25 ug via INTRAVENOUS

## 2022-08-27 MED ORDER — MIDAZOLAM HCL 5 MG/5ML IJ SOLN
INTRAMUSCULAR | Status: DC | PRN
  Administered 2022-08-27: 1 mg via INTRAVENOUS

## 2022-08-27 MED ORDER — SODIUM CHLORIDE 0.9 % IV SOLN
INTRAVENOUS | Status: AC
  Filled 2022-08-27: qty 2

## 2022-08-27 MED ORDER — SODIUM CHLORIDE 0.9 % IV SOLN: INTRAVENOUS | Status: DC

## 2022-08-27 MED ORDER — DIPHENHYDRAMINE HCL 50 MG/ML IJ SOLN
INTRAMUSCULAR | Status: DC | PRN
  Administered 2022-08-27: 25 mg via INTRAVENOUS

## 2022-08-27 MED ORDER — SODIUM CHLORIDE 0.9 % IV SOLN
80.0000 mg | INTRAVENOUS | Status: AC
  Administered 2022-08-27: 80 mg

## 2022-08-27 MED ORDER — ACETAMINOPHEN 325 MG PO TABS: 325.0000 mg | ORAL_TABLET | ORAL | Status: DC | PRN

## 2022-08-27 MED ORDER — HEPARIN (PORCINE) IN NACL 1000-0.9 UT/500ML-% IV SOLN
INTRAVENOUS | Status: DC | PRN
  Administered 2022-08-27: 500 mL

## 2022-08-27 MED ORDER — MIDAZOLAM HCL 2 MG/2ML IJ SOLN
INTRAMUSCULAR | Status: AC
  Filled 2022-08-27: qty 2

## 2022-08-27 MED ORDER — CEFAZOLIN SODIUM-DEXTROSE 2-4 GM/100ML-% IV SOLN
INTRAVENOUS | Status: AC
  Filled 2022-08-27: qty 100

## 2022-08-27 MED ORDER — CEFAZOLIN SODIUM-DEXTROSE 2-4 GM/100ML-% IV SOLN
2.0000 g | INTRAVENOUS | Status: AC
  Administered 2022-08-27: 2 g via INTRAVENOUS

## 2022-08-27 MED ORDER — LIDOCAINE HCL (PF) 1 % IJ SOLN
INTRAMUSCULAR | Status: DC | PRN
  Administered 2022-08-27: 50 mL

## 2022-08-27 SURGICAL SUPPLY — 7 items
CABLE SURGICAL S-101-97-12 (CABLE) ×1 IMPLANT
ICD GALLANT VR CDVRA500Q (ICD Generator) IMPLANT
KIT MICROPUNCTURE NIT STIFF (SHEATH) IMPLANT
LEAD DURATA 7122Q-58CM (Lead) IMPLANT
PAD DEFIB RADIO PHYSIO CONN (PAD) ×1 IMPLANT
SHEATH 7FR PRELUDE SNAP 13 (SHEATH) IMPLANT
TRAY PACEMAKER INSERTION (PACKS) ×1 IMPLANT

## 2022-08-27 NOTE — Discharge Instructions (Addendum)
After Your ICD (Implantable Cardiac Defibrillator)   You have a [employer] ICD  ACTIVITY Do not lift your arm above shoulder height for 1 week after your procedure. After 7 days, you may progress as below.  You should remove your sling 24 hours after your procedure, unless otherwise instructed by your provider.     Monday September 03, 2022  Tuesday September 04, 2022 Wednesday September 05, 2022 Thursday September 06, 2022   Do not lift, push, pull, or carry anything over 10 pounds with the affected arm until 6 weeks (Monday October 08, 2022 ) after your procedure.   You may drive AFTER your wound check, unless you have been told otherwise by your provider.   Ask your healthcare provider when you can go back to work   INCISION/Dressing If you are on a blood thinner such as Coumadin, Xarelto, Eliquis, Plavix, or Pradaxa please confirm with your provider when this should be resumed. Resume Plavix on Thursday and Xarelto on Friday   If large square, outer bandage is left in place, this can be removed after 24 hours from your procedure. Do not remove steri-strips or glue as below.   Monitor your defibrillator site for redness, swelling, and drainage. Call the device clinic at [phone] if you experience these symptoms or fever/chills.  If your incision is sealed with Steri-strips or staples, you may shower 7 days after your procedure or when told by your provider. Do not remove the steri-strips or let the shower hit directly on your site. You may wash around your site with soap and water.    If you were discharged in a sling, please do not wear this during the day more than 48 hours after your surgery unless otherwise instructed. This may increase the risk of stiffness and soreness in your shoulder.   Avoid lotions, ointments, or perfumes over your incision until it is well-healed.  You may use a hot tub or a pool AFTER your wound check appointment if the incision is completely closed.  Your ICD is  designed to protect you from life threatening heart rhythms. Because of this, you may receive a shock.   1 shock with no symptoms:  Call the office during business hours. 1 shock with symptoms (chest pain, chest pressure, dizziness, lightheadedness, shortness of breath, overall feeling unwell):  Call 911. If you experience 2 or more shocks in 24 hours:  Call 911. If you receive a shock, you should not drive for 6 months per the [place] DMV IF you receive appropriate therapy from your ICD.   ICD Alerts:  Some alerts are vibratory and others beep. These are NOT emergencies. Please call our office to let us know. If this occurs at night or on weekends, it can wait until the next business day. Send a remote transmission.  If your device is capable of reading fluid status (for heart failure), you will be offered monthly monitoring to review this with you.   DEVICE MANAGEMENT Remote monitoring is used to monitor your ICD from home. This monitoring is scheduled every 91 days by our office. It allows [place] to keep an eye on the functioning of your device to ensure it is working properly. You will routinely see your Electrophysiologist annually (more often if necessary).   You should receive your ID card for your new device in 4-8 weeks. Keep this card with you at all times once received. Consider wearing a medical alert bracelet or necklace.  Your ICD  may be MRI  compatible. This will be discussed at your next office visit/wound check.  You should avoid contact with strong electric or magnetic fields.   Do not use amateur (ham) radio equipment or electric (arc) welding torches. MP3 player headphones with magnets should not be used. Some devices are safe to use if held at least 12 inches (30 cm) from your defibrillator. These include power tools, lawn mowers, and speakers. If you are unsure if something is safe to use, ask your health care provider.  When using your cell phone, hold it to the ear that is on the  opposite side from the defibrillator. Do not leave your cell phone in a pocket over the defibrillator.  You may safely use electric blankets, heating pads, computers, and microwave ovens.  Call the office right away if: You have chest pain. You feel more than one shock. You feel more short of breath than you have felt before. You feel more light-headed than you have felt before. Your incision starts to open up.  This information is not intended to replace advice given to you by your health care provider. Make sure you discuss any questions you have with your health care provider.

## 2022-08-27 NOTE — Interval H&P Note (Signed)
History and Physical Interval Note:  08/27/2022 3:07 PM  Ebony Scott  has presented today for surgery, with the diagnosis of heart failure.  The various methods of treatment have been discussed with the patient and family. After consideration of risks, benefits and other options for treatment, the patient has consented to  Procedure(s): ICD IMPLANT (N/A) as a surgical intervention.  The patient's history has been reviewed, patient examined, no change in status, stable for surgery.  I have reviewed the patient's chart and labs.  Questions were answered to the patient's satisfaction.     Roberts Gaudy Karthika Glasper

## 2022-08-28 ENCOUNTER — Telehealth: Payer: Self-pay | Admitting: Cardiovascular Disease

## 2022-08-28 ENCOUNTER — Encounter (HOSPITAL_COMMUNITY): Payer: Self-pay | Admitting: Cardiovascular Disease

## 2022-08-28 MED FILL — Midazolam HCl Inj 2 MG/2ML (Base Equivalent): INTRAMUSCULAR | Qty: 1 | Status: AC

## 2022-08-28 NOTE — Telephone Encounter (Signed)
  Per E-mail from our billing team we can submit via [employer].

## 2022-08-28 NOTE — Telephone Encounter (Signed)
Patient said that she had defib implanted yesterday and since then she said she's been having chills and been in a lot pain. Patient is also scared of infection and wants to know if there is a medicine or something that could be prescribed

## 2022-08-28 NOTE — Telephone Encounter (Signed)
Patient reports of pain at incision site and chills since last night and all day today. Reports taking 2 tylenol and 1 tramadol @ 4:00 AM with no relief. Denies using ice over the area. Denies redness, swelling or bleeding to site. Patient denies fever, most recent temperature 97.8.   Advised patient I will speak with Dr. Nelly Laurence in clinic today to see what his recommendations are. Pt voiced understanding and appreciative of call.

## 2022-08-28 NOTE — Telephone Encounter (Signed)
Spoke to Dr. Nelly Laurence who advised to continue tylenol as needed for pain, may also use ice to the area with a barrier between to help reduce any inflammation. Patient to call to let us know if she develops fever, redness, swelling, drainage or bleeding at site. Patient voiced understanding and appreciative of call.

## 2022-08-29 ENCOUNTER — Telehealth (HOSPITAL_BASED_OUTPATIENT_CLINIC_OR_DEPARTMENT_OTHER): Payer: Self-pay

## 2022-08-29 NOTE — Telephone Encounter (Signed)
   Pre-operative Risk Assessment    Patient Name: Ebony Scott  DOB: 07-11-1950 MRN: [id number]      Request for Surgical Clearance    Procedure:   Left Reverse Total Shoulder Replacement  Date of Surgery:  Clearance TBD                                 Surgeon:  Teryl Lucy, MD Surgeon's Group or Practice Name:  [facility] Phone number:  [phone] Fax number:  [phone] Roanna Raider Gavin--Scheduler)   Type of Clearance Requested:   - Medical  - Pharmacy:  Hold Clopidogrel (Plavix) and Rivaroxaban (Xarelto) how long?   Type of Anesthesia:  General w/ Interscalene Block   Additional requests/questions:   None  Signed, Jeanene Erb   08/29/2022, 12:22 PM

## 2022-08-30 NOTE — Telephone Encounter (Signed)
Patient with history of PE, followed by Dr. Tanya Nones.  Please defer to him for anticoagulation hold.

## 2022-08-30 NOTE — Telephone Encounter (Addendum)
Will need Dr. Cristal Deer to weigh in given length of stent (34 mm) - pt on plavix and xarelto, no ASA.   Can he hold plavix for shoulder surgery? Do you want him to take ASA while holding plavix?  In addition, it appears from last office note he will need a nuclear stress test prior to orthopedic surgeries.    I will also reach to Dr. Nelly Laurence given recent ICD placement. How long does he need to wait for shoulder surgery after ICD implant?  Based on Dr. Morrie Sheldon recommendations on timing of next surgery, we can schedule the nuclear stress test.

## 2022-08-31 ENCOUNTER — Encounter (HOSPITAL_COMMUNITY): Payer: Self-pay

## 2022-08-31 ENCOUNTER — Telehealth: Payer: Self-pay | Admitting: Pulmonary Disease

## 2022-08-31 ENCOUNTER — Inpatient Hospital Stay (HOSPITAL_COMMUNITY)
Admission: EM | Admit: 2022-08-31 | Discharge: 2022-09-04 | DRG: 194 | Disposition: A | Discharge status: Home Health | Payer: Medicare PPO | Attending: Internal Medicine | Admitting: Internal Medicine

## 2022-08-31 ENCOUNTER — Emergency Department (HOSPITAL_COMMUNITY): Payer: Medicare PPO

## 2022-08-31 ENCOUNTER — Other Ambulatory Visit: Payer: Self-pay

## 2022-08-31 DIAGNOSIS — Z8261 Family history of arthritis: Secondary | ICD-10-CM

## 2022-08-31 DIAGNOSIS — Z833 Family history of diabetes mellitus: Secondary | ICD-10-CM

## 2022-08-31 DIAGNOSIS — Z95810 Presence of automatic (implantable) cardiac defibrillator: Secondary | ICD-10-CM | POA: Diagnosis not present

## 2022-08-31 DIAGNOSIS — E7800 Pure hypercholesterolemia, unspecified: Secondary | ICD-10-CM | POA: Diagnosis present

## 2022-08-31 DIAGNOSIS — M79603 Pain in arm, unspecified: Secondary | ICD-10-CM | POA: Diagnosis not present

## 2022-08-31 DIAGNOSIS — E039 Hypothyroidism, unspecified: Secondary | ICD-10-CM | POA: Diagnosis present

## 2022-08-31 DIAGNOSIS — R5383 Other fatigue: Secondary | ICD-10-CM | POA: Diagnosis not present

## 2022-08-31 DIAGNOSIS — Z886 Allergy status to analgesic agent status: Secondary | ICD-10-CM

## 2022-08-31 DIAGNOSIS — E1122 Type 2 diabetes mellitus with diabetic chronic kidney disease: Secondary | ICD-10-CM | POA: Diagnosis present

## 2022-08-31 DIAGNOSIS — Z66 Do not resuscitate: Secondary | ICD-10-CM | POA: Diagnosis present

## 2022-08-31 DIAGNOSIS — N1832 Chronic kidney disease, stage 3b: Secondary | ICD-10-CM | POA: Diagnosis present

## 2022-08-31 DIAGNOSIS — M069 Rheumatoid arthritis, unspecified: Secondary | ICD-10-CM | POA: Diagnosis present

## 2022-08-31 DIAGNOSIS — F39 Unspecified mood [affective] disorder: Secondary | ICD-10-CM | POA: Diagnosis present

## 2022-08-31 DIAGNOSIS — Z9842 Cataract extraction status, left eye: Secondary | ICD-10-CM

## 2022-08-31 DIAGNOSIS — R112 Nausea with vomiting, unspecified: Secondary | ICD-10-CM | POA: Diagnosis not present

## 2022-08-31 DIAGNOSIS — Z91013 Allergy to seafood: Secondary | ICD-10-CM

## 2022-08-31 DIAGNOSIS — M797 Fibromyalgia: Secondary | ICD-10-CM | POA: Diagnosis present

## 2022-08-31 DIAGNOSIS — I2510 Atherosclerotic heart disease of native coronary artery without angina pectoris: Secondary | ICD-10-CM | POA: Diagnosis present

## 2022-08-31 DIAGNOSIS — J4550 Severe persistent asthma, uncomplicated: Secondary | ICD-10-CM | POA: Diagnosis present

## 2022-08-31 DIAGNOSIS — I517 Cardiomegaly: Secondary | ICD-10-CM | POA: Diagnosis not present

## 2022-08-31 DIAGNOSIS — R6520 Severe sepsis without septic shock: Secondary | ICD-10-CM | POA: Diagnosis not present

## 2022-08-31 DIAGNOSIS — I959 Hypotension, unspecified: Secondary | ICD-10-CM | POA: Diagnosis not present

## 2022-08-31 DIAGNOSIS — I420 Dilated cardiomyopathy: Secondary | ICD-10-CM | POA: Diagnosis not present

## 2022-08-31 DIAGNOSIS — Z9049 Acquired absence of other specified parts of digestive tract: Secondary | ICD-10-CM

## 2022-08-31 DIAGNOSIS — Z1152 Encounter for screening for COVID-19: Secondary | ICD-10-CM

## 2022-08-31 DIAGNOSIS — R32 Unspecified urinary incontinence: Secondary | ICD-10-CM | POA: Diagnosis not present

## 2022-08-31 DIAGNOSIS — Z888 Allergy status to other drugs, medicaments and biological substances status: Secondary | ICD-10-CM

## 2022-08-31 DIAGNOSIS — M109 Gout, unspecified: Secondary | ICD-10-CM | POA: Diagnosis present

## 2022-08-31 DIAGNOSIS — J189 Pneumonia, unspecified organism: Principal | ICD-10-CM | POA: Diagnosis present

## 2022-08-31 DIAGNOSIS — N179 Acute kidney failure, unspecified: Secondary | ICD-10-CM | POA: Diagnosis present

## 2022-08-31 DIAGNOSIS — J470 Bronchiectasis with acute lower respiratory infection: Secondary | ICD-10-CM | POA: Diagnosis not present

## 2022-08-31 DIAGNOSIS — G4733 Obstructive sleep apnea (adult) (pediatric): Secondary | ICD-10-CM | POA: Diagnosis present

## 2022-08-31 DIAGNOSIS — Y95 Nosocomial condition: Secondary | ICD-10-CM | POA: Diagnosis present

## 2022-08-31 DIAGNOSIS — D849 Immunodeficiency, unspecified: Secondary | ICD-10-CM | POA: Diagnosis not present

## 2022-08-31 DIAGNOSIS — Z7951 Long term (current) use of inhaled steroids: Secondary | ICD-10-CM

## 2022-08-31 DIAGNOSIS — G9341 Metabolic encephalopathy: Principal | ICD-10-CM

## 2022-08-31 DIAGNOSIS — Z981 Arthrodesis status: Secondary | ICD-10-CM

## 2022-08-31 DIAGNOSIS — Z885 Allergy status to narcotic agent status: Secondary | ICD-10-CM

## 2022-08-31 DIAGNOSIS — Z880 Allergy status to penicillin: Secondary | ICD-10-CM

## 2022-08-31 DIAGNOSIS — Z881 Allergy status to other antibiotic agents status: Secondary | ICD-10-CM

## 2022-08-31 DIAGNOSIS — Z823 Family history of stroke: Secondary | ICD-10-CM

## 2022-08-31 DIAGNOSIS — D696 Thrombocytopenia, unspecified: Secondary | ICD-10-CM | POA: Diagnosis not present

## 2022-08-31 DIAGNOSIS — Z955 Presence of coronary angioplasty implant and graft: Secondary | ICD-10-CM

## 2022-08-31 DIAGNOSIS — Z90710 Acquired absence of both cervix and uterus: Secondary | ICD-10-CM

## 2022-08-31 DIAGNOSIS — A419 Sepsis, unspecified organism: Secondary | ICD-10-CM | POA: Diagnosis not present

## 2022-08-31 DIAGNOSIS — R509 Fever, unspecified: Secondary | ICD-10-CM | POA: Diagnosis not present

## 2022-08-31 DIAGNOSIS — Z853 Personal history of malignant neoplasm of breast: Secondary | ICD-10-CM

## 2022-08-31 DIAGNOSIS — J984 Other disorders of lung: Secondary | ICD-10-CM | POA: Diagnosis not present

## 2022-08-31 DIAGNOSIS — E739 Lactose intolerance, unspecified: Secondary | ICD-10-CM | POA: Diagnosis present

## 2022-08-31 DIAGNOSIS — Z79899 Other long term (current) drug therapy: Secondary | ICD-10-CM

## 2022-08-31 DIAGNOSIS — E1142 Type 2 diabetes mellitus with diabetic polyneuropathy: Secondary | ICD-10-CM | POA: Diagnosis present

## 2022-08-31 DIAGNOSIS — I5022 Chronic systolic (congestive) heart failure: Secondary | ICD-10-CM | POA: Diagnosis present

## 2022-08-31 DIAGNOSIS — E785 Hyperlipidemia, unspecified: Secondary | ICD-10-CM | POA: Diagnosis present

## 2022-08-31 DIAGNOSIS — N189 Chronic kidney disease, unspecified: Secondary | ICD-10-CM | POA: Diagnosis present

## 2022-08-31 DIAGNOSIS — Z96611 Presence of right artificial shoulder joint: Secondary | ICD-10-CM | POA: Diagnosis present

## 2022-08-31 DIAGNOSIS — R9431 Abnormal electrocardiogram [ECG] [EKG]: Secondary | ICD-10-CM | POA: Diagnosis not present

## 2022-08-31 DIAGNOSIS — Z7901 Long term (current) use of anticoagulants: Secondary | ICD-10-CM

## 2022-08-31 DIAGNOSIS — F32A Depression, unspecified: Secondary | ICD-10-CM | POA: Diagnosis present

## 2022-08-31 DIAGNOSIS — Z801 Family history of malignant neoplasm of trachea, bronchus and lung: Secondary | ICD-10-CM

## 2022-08-31 DIAGNOSIS — Z7984 Long term (current) use of oral hypoglycemic drugs: Secondary | ICD-10-CM

## 2022-08-31 DIAGNOSIS — Z86010 Personal history of colonic polyps: Secondary | ICD-10-CM

## 2022-08-31 DIAGNOSIS — K219 Gastro-esophageal reflux disease without esophagitis: Secondary | ICD-10-CM | POA: Diagnosis present

## 2022-08-31 DIAGNOSIS — Z961 Presence of intraocular lens: Secondary | ICD-10-CM | POA: Diagnosis present

## 2022-08-31 DIAGNOSIS — M810 Age-related osteoporosis without current pathological fracture: Secondary | ICD-10-CM | POA: Diagnosis present

## 2022-08-31 DIAGNOSIS — Z86711 Personal history of pulmonary embolism: Secondary | ICD-10-CM | POA: Diagnosis present

## 2022-08-31 DIAGNOSIS — Z800 Family history of malignant neoplasm of digestive organs: Secondary | ICD-10-CM

## 2022-08-31 DIAGNOSIS — D709 Neutropenia, unspecified: Secondary | ICD-10-CM

## 2022-08-31 DIAGNOSIS — Z9841 Cataract extraction status, right eye: Secondary | ICD-10-CM

## 2022-08-31 DIAGNOSIS — R918 Other nonspecific abnormal finding of lung field: Secondary | ICD-10-CM | POA: Diagnosis not present

## 2022-08-31 DIAGNOSIS — E119 Type 2 diabetes mellitus without complications: Secondary | ICD-10-CM

## 2022-08-31 DIAGNOSIS — R531 Weakness: Secondary | ICD-10-CM | POA: Diagnosis not present

## 2022-08-31 DIAGNOSIS — Z7902 Long term (current) use of antithrombotics/antiplatelets: Secondary | ICD-10-CM

## 2022-08-31 DIAGNOSIS — Z86718 Personal history of other venous thrombosis and embolism: Secondary | ICD-10-CM

## 2022-08-31 DIAGNOSIS — Z7952 Long term (current) use of systemic steroids: Secondary | ICD-10-CM

## 2022-08-31 DIAGNOSIS — Z8249 Family history of ischemic heart disease and other diseases of the circulatory system: Secondary | ICD-10-CM

## 2022-08-31 LAB — CBC WITH DIFFERENTIAL/PLATELET
Abs Immature Granulocytes: 0 10*3/uL (ref 0.00–0.07)
Basophils Absolute: 0 10*3/uL (ref 0.0–0.1)
Basophils Relative: 0 %
Eosinophils Absolute: 0 10*3/uL (ref 0.0–0.5)
Eosinophils Relative: 0 %
HCT: 31.2 % — ABNORMAL LOW (ref 36.0–46.0)
Hemoglobin: 10.1 g/dL — ABNORMAL LOW (ref 12.0–15.0)
Lymphocytes Relative: 7 %
Lymphs Abs: 0.9 10*3/uL (ref 0.7–4.0)
MCH: 35.7 pg — ABNORMAL HIGH (ref 26.0–34.0)
MCHC: 32.4 g/dL (ref 30.0–36.0)
MCV: 110.2 fL — ABNORMAL HIGH (ref 80.0–100.0)
Monocytes Absolute: 2.4 10*3/uL — ABNORMAL HIGH (ref 0.1–1.0)
Monocytes Relative: 18 %
Neutro Abs: 9.9 10*3/uL — ABNORMAL HIGH (ref 1.7–7.7)
Neutrophils Relative %: 75 %
Platelets: 132 10*3/uL — ABNORMAL LOW (ref 150–400)
RBC: 2.83 MIL/uL — ABNORMAL LOW (ref 3.87–5.11)
RDW: 14.6 % (ref 11.5–15.5)
WBC: 13.2 10*3/uL — ABNORMAL HIGH (ref 4.0–10.5)
nRBC: 0 % (ref 0.0–0.2)
nRBC: 0 /100 WBC

## 2022-08-31 LAB — URINALYSIS, W/ REFLEX TO CULTURE (INFECTION SUSPECTED)
Bacteria, UA: NONE SEEN
Bilirubin Urine: NEGATIVE
Glucose, UA: >=500 mg/dL — AB
Hgb urine dipstick: NEGATIVE
Ketones, ur: NEGATIVE mg/dL
Leukocytes,Ua: NEGATIVE
Nitrite: NEGATIVE
Protein, ur: NEGATIVE mg/dL
Specific Gravity, Urine: 1.014 (ref 1.005–1.030)
pH: 6 (ref 5.0–8.0)

## 2022-08-31 LAB — RESP PANEL BY RT-PCR (RSV, FLU A&B, COVID)  RVPGX2
Influenza A by PCR: NEGATIVE
Influenza B by PCR: NEGATIVE
Resp Syncytial Virus by PCR: NEGATIVE
SARS Coronavirus 2 by RT PCR: NEGATIVE

## 2022-08-31 LAB — GLUCOSE, CAPILLARY
Glucose-Capillary: 125 mg/dL — ABNORMAL HIGH (ref 70–99)
Glucose-Capillary: 105 mg/dL — ABNORMAL HIGH (ref 70–99)

## 2022-08-31 LAB — COMPREHENSIVE METABOLIC PANEL
ALT: 24 U/L (ref 0–44)
AST: 37 U/L (ref 15–41)
Albumin: 2.7 g/dL — ABNORMAL LOW (ref 3.5–5.0)
Alkaline Phosphatase: 49 U/L (ref 38–126)
Anion gap: 9 (ref 5–15)
BUN: 36 mg/dL — ABNORMAL HIGH (ref 8–23)
CO2: 21 mmol/L — ABNORMAL LOW (ref 22–32)
Calcium: 8.8 mg/dL — ABNORMAL LOW (ref 8.9–10.3)
Chloride: 107 mmol/L (ref 98–111)
Creatinine, Ser: 2.58 mg/dL — ABNORMAL HIGH (ref 0.44–1.00)
GFR, Estimated: 19 mL/min — ABNORMAL LOW (ref >60)
Glucose, Bld: 147 mg/dL — ABNORMAL HIGH (ref 70–99)
Potassium: 4.6 mmol/L (ref 3.5–5.1)
Sodium: 137 mmol/L (ref 135–145)
Total Bilirubin: 0.5 mg/dL (ref 0.3–1.2)
Total Protein: 6.5 g/dL (ref 6.5–8.1)

## 2022-08-31 LAB — PROCALCITONIN: Procalcitonin: 0.47 ng/mL

## 2022-08-31 LAB — PROTIME-INR
INR: 1.5 — ABNORMAL HIGH (ref 0.8–1.2)
Prothrombin Time: 18.7 seconds — ABNORMAL HIGH (ref 11.4–15.2)

## 2022-08-31 LAB — APTT: aPTT: 37 seconds — ABNORMAL HIGH (ref 24–36)

## 2022-08-31 LAB — I-STAT CG4 LACTIC ACID, ED: Lactic Acid, Venous: 1.7 mmol/L (ref 0.5–1.9)

## 2022-08-31 MED ORDER — RIVAROXABAN 10 MG PO TABS
20.0000 mg | ORAL_TABLET | Freq: Every day | ORAL | Status: DC
  Administered 2022-08-31: 20 mg via ORAL
  Filled 2022-08-31 (×2): qty 2

## 2022-08-31 MED ORDER — INSULIN ASPART 100 UNIT/ML IJ SOLN
0.0000 [IU] | Freq: Three times a day (TID) | INTRAMUSCULAR | Status: DC
  Administered 2022-08-31: 1 [IU] via SUBCUTANEOUS
  Administered 2022-09-01: 2 [IU] via SUBCUTANEOUS
  Administered 2022-09-02: 3 [IU] via SUBCUTANEOUS
  Administered 2022-09-02 – 2022-09-03 (×2): 2 [IU] via SUBCUTANEOUS

## 2022-08-31 MED ORDER — PANTOPRAZOLE SODIUM 40 MG PO TBEC
40.0000 mg | DELAYED_RELEASE_TABLET | Freq: Every day | ORAL | Status: DC
  Administered 2022-09-01 – 2022-09-04 (×4): 40 mg via ORAL
  Filled 2022-08-31 (×4): qty 1

## 2022-08-31 MED ORDER — INSULIN ASPART 100 UNIT/ML IJ SOLN: 0.0000 [IU] | Freq: Every day | INTRAMUSCULAR | Status: DC

## 2022-08-31 MED ORDER — CYPROHEPTADINE HCL 4 MG PO TABS
12.0000 mg | ORAL_TABLET | Freq: Every day | ORAL | Status: DC
  Administered 2022-08-31 – 2022-09-03 (×4): 12 mg via ORAL
  Filled 2022-08-31 (×4): qty 3

## 2022-08-31 MED ORDER — SODIUM CHLORIDE 0.9% FLUSH
3.0000 mL | Freq: Two times a day (BID) | INTRAVENOUS | Status: DC
  Administered 2022-08-31 – 2022-09-04 (×7): 3 mL via INTRAVENOUS

## 2022-08-31 MED ORDER — SODIUM CHLORIDE 0.9 % IV BOLUS: 500.0000 mL | Freq: Once | INTRAVENOUS | Status: DC

## 2022-08-31 MED ORDER — ALBUTEROL SULFATE (2.5 MG/3ML) 0.083% IN NEBU: 2.5000 mg | INHALATION_SOLUTION | Freq: Four times a day (QID) | RESPIRATORY_TRACT | Status: DC | PRN

## 2022-08-31 MED ORDER — ENSURE ENLIVE PO LIQD
237.0000 mL | Freq: Two times a day (BID) | ORAL | Status: DC
  Administered 2022-09-01 – 2022-09-03 (×5): 237 mL via ORAL

## 2022-08-31 MED ORDER — LACTATED RINGERS IV SOLN: INTRAVENOUS | Status: DC

## 2022-08-31 MED ORDER — SODIUM CHLORIDE 0.9 % IV BOLUS
500.0000 mL | Freq: Once | INTRAVENOUS | Status: AC
  Administered 2022-08-31: 500 mL via INTRAVENOUS

## 2022-08-31 MED ORDER — UMECLIDINIUM BROMIDE 62.5 MCG/ACT IN AEPB
1.0000 | INHALATION_SPRAY | Freq: Every day | RESPIRATORY_TRACT | Status: DC
  Administered 2022-09-02 – 2022-09-04 (×3): 1 via RESPIRATORY_TRACT
  Filled 2022-08-31: qty 7

## 2022-08-31 MED ORDER — BISACODYL 5 MG PO TBEC: 5.0000 mg | DELAYED_RELEASE_TABLET | Freq: Every day | ORAL | Status: DC | PRN

## 2022-08-31 MED ORDER — ALLOPURINOL 100 MG PO TABS
100.0000 mg | ORAL_TABLET | Freq: Every day | ORAL | Status: DC
  Administered 2022-08-31 – 2022-09-03 (×4): 100 mg via ORAL
  Filled 2022-08-31 (×4): qty 1

## 2022-08-31 MED ORDER — CLOPIDOGREL BISULFATE 75 MG PO TABS
75.0000 mg | ORAL_TABLET | Freq: Every day | ORAL | Status: DC
  Administered 2022-09-01 – 2022-09-04 (×4): 75 mg via ORAL
  Filled 2022-08-31 (×4): qty 1

## 2022-08-31 MED ORDER — ACETAMINOPHEN 650 MG RE SUPP: 650.0000 mg | Freq: Four times a day (QID) | RECTAL | Status: DC | PRN

## 2022-08-31 MED ORDER — MONTELUKAST SODIUM 10 MG PO TABS
10.0000 mg | ORAL_TABLET | Freq: Every day | ORAL | Status: DC
  Administered 2022-08-31 – 2022-09-03 (×4): 10 mg via ORAL
  Filled 2022-08-31 (×4): qty 1

## 2022-08-31 MED ORDER — MIRTAZAPINE 30 MG PO TBDP
30.0000 mg | ORAL_TABLET | Freq: Every day | ORAL | Status: DC
  Administered 2022-08-31 – 2022-09-03 (×4): 30 mg via ORAL
  Filled 2022-08-31 (×4): qty 1

## 2022-08-31 MED ORDER — VANCOMYCIN HCL 1250 MG/250ML IV SOLN
1250.0000 mg | Freq: Once | INTRAVENOUS | Status: AC
  Administered 2022-08-31: 1250 mg via INTRAVENOUS
  Filled 2022-08-31: qty 250

## 2022-08-31 MED ORDER — VANCOMYCIN VARIABLE DOSE PER UNSTABLE RENAL FUNCTION (PHARMACIST DOSING): Status: DC

## 2022-08-31 MED ORDER — POLYETHYLENE GLYCOL 3350 17 G PO PACK: 17.0000 g | PACK | Freq: Every day | ORAL | Status: DC | PRN

## 2022-08-31 MED ORDER — ONDANSETRON HCL 4 MG PO TABS: 4.0000 mg | ORAL_TABLET | Freq: Four times a day (QID) | ORAL | Status: DC | PRN

## 2022-08-31 MED ORDER — GABAPENTIN 300 MG PO CAPS
300.0000 mg | ORAL_CAPSULE | Freq: Every day | ORAL | Status: DC
  Administered 2022-08-31 – 2022-09-03 (×4): 300 mg via ORAL
  Filled 2022-08-31 (×4): qty 1

## 2022-08-31 MED ORDER — FAMOTIDINE 20 MG PO TABS
20.0000 mg | ORAL_TABLET | Freq: Every day | ORAL | Status: DC
  Administered 2022-08-31 – 2022-09-03 (×4): 20 mg via ORAL
  Filled 2022-08-31 (×4): qty 1

## 2022-08-31 MED ORDER — GUAIFENESIN ER 600 MG PO TB12: 600.0000 mg | ORAL_TABLET | Freq: Two times a day (BID) | ORAL | Status: DC

## 2022-08-31 MED ORDER — ATORVASTATIN CALCIUM 40 MG PO TABS
80.0000 mg | ORAL_TABLET | Freq: Every day | ORAL | Status: DC
  Administered 2022-08-31 – 2022-09-03 (×4): 80 mg via ORAL
  Filled 2022-08-31 (×4): qty 2

## 2022-08-31 MED ORDER — ISOSORB DINITRATE-HYDRALAZINE 20-37.5 MG PO TABS
1.0000 | ORAL_TABLET | Freq: Two times a day (BID) | ORAL | Status: DC
  Administered 2022-09-01 – 2022-09-04 (×7): 1 via ORAL
  Filled 2022-08-31 (×7): qty 1

## 2022-08-31 MED ORDER — PREDNISONE 1 MG PO TABS
2.0000 mg | ORAL_TABLET | Freq: Every morning | ORAL | Status: DC
  Administered 2022-09-01 – 2022-09-04 (×4): 2 mg via ORAL
  Filled 2022-08-31 (×4): qty 2

## 2022-08-31 MED ORDER — ONDANSETRON HCL 4 MG/2ML IJ SOLN: 4.0000 mg | Freq: Four times a day (QID) | INTRAMUSCULAR | Status: DC | PRN

## 2022-08-31 MED ORDER — ACETAMINOPHEN 325 MG PO TABS
650.0000 mg | ORAL_TABLET | Freq: Four times a day (QID) | ORAL | Status: DC | PRN
  Administered 2022-09-01 – 2022-09-04 (×5): 650 mg via ORAL
  Filled 2022-08-31 (×5): qty 2

## 2022-08-31 MED ORDER — HYDRALAZINE HCL 20 MG/ML IJ SOLN: 5.0000 mg | INTRAMUSCULAR | Status: DC | PRN

## 2022-08-31 MED ORDER — BUDESONIDE 0.5 MG/2ML IN SUSP
0.5000 mg | Freq: Two times a day (BID) | RESPIRATORY_TRACT | Status: DC
  Administered 2022-08-31 – 2022-09-04 (×7): 0.5 mg via RESPIRATORY_TRACT
  Filled 2022-08-31 (×8): qty 2

## 2022-08-31 MED ORDER — BENZONATATE 100 MG PO CAPS
200.0000 mg | ORAL_CAPSULE | Freq: Two times a day (BID) | ORAL | Status: DC | PRN
  Administered 2022-09-02 – 2022-09-03 (×4): 200 mg via ORAL
  Filled 2022-08-31 (×4): qty 2

## 2022-08-31 MED ORDER — LORATADINE 10 MG PO TABS
10.0000 mg | ORAL_TABLET | Freq: Every day | ORAL | Status: DC
  Administered 2022-08-31 – 2022-09-03 (×4): 10 mg via ORAL
  Filled 2022-08-31 (×4): qty 1

## 2022-08-31 MED ORDER — IPRATROPIUM-ALBUTEROL 0.5-2.5 (3) MG/3ML IN SOLN: 3.0000 mL | Freq: Four times a day (QID) | RESPIRATORY_TRACT | Status: DC | PRN

## 2022-08-31 MED ORDER — GUAIFENESIN ER 600 MG PO TB12
600.0000 mg | ORAL_TABLET | Freq: Two times a day (BID) | ORAL | Status: DC
  Administered 2022-08-31 – 2022-09-04 (×8): 600 mg via ORAL
  Filled 2022-08-31 (×8): qty 1

## 2022-08-31 MED ORDER — ARFORMOTEROL TARTRATE 15 MCG/2ML IN NEBU
15.0000 ug | INHALATION_SOLUTION | Freq: Two times a day (BID) | RESPIRATORY_TRACT | Status: DC
  Administered 2022-08-31 – 2022-09-04 (×7): 15 ug via RESPIRATORY_TRACT
  Filled 2022-08-31 (×8): qty 2

## 2022-08-31 MED ORDER — POLYVINYL ALCOHOL 1.4 % OP SOLN
1.0000 [drp] | Freq: Two times a day (BID) | OPHTHALMIC | Status: DC
  Administered 2022-09-01 – 2022-09-04 (×7): 1 [drp] via OPHTHALMIC
  Filled 2022-08-31 (×2): qty 15

## 2022-08-31 MED ORDER — ADULT MULTIVITAMIN W/MINERALS CH
1.0000 | ORAL_TABLET | Freq: Every day | ORAL | Status: DC
  Administered 2022-08-31 – 2022-09-04 (×5): 1 via ORAL
  Filled 2022-08-31 (×5): qty 1

## 2022-08-31 MED ORDER — PREDNISONE 5 MG PO TABS
5.0000 mg | ORAL_TABLET | Freq: Every morning | ORAL | Status: DC
  Administered 2022-09-01 – 2022-09-04 (×4): 5 mg via ORAL
  Filled 2022-08-31 (×4): qty 1

## 2022-08-31 MED ORDER — SODIUM CHLORIDE 0.9 % IV SOLN
2.0000 g | INTRAVENOUS | Status: DC
  Administered 2022-08-31: 2 g via INTRAVENOUS
  Filled 2022-08-31: qty 12.5

## 2022-08-31 MED ORDER — DOCUSATE SODIUM 100 MG PO CAPS
100.0000 mg | ORAL_CAPSULE | Freq: Two times a day (BID) | ORAL | Status: DC
  Administered 2022-09-02: 100 mg via ORAL
  Filled 2022-08-31 (×8): qty 1

## 2022-08-31 NOTE — Telephone Encounter (Signed)
Based on ARISCAT Score of 36, she has a 13.3% (moderate risk) of in hospital postoperative pulmonary complications, as defined by the occurrence of respiratory failure, respiratory infection, pleural effusion, atelectasis, pneumothorax, bronchospasm or aspiration pneumonitis.   Melody Comas, MD [facility] Office: [phone]

## 2022-08-31 NOTE — Consult Note (Signed)
ELECTROPHYSIOLOGY CONSULT NOTE    Patient ID: Ebony Scott MRN: [id number], DOB/AGE: [birth year] 72 y.o.  Admit date: 08/31/2022 Date of Consult: 08/31/2022  Primary Physician: [name], MD Primary Cardiologist: Jodelle Red, MD  Electrophysiologist: Dr. Nelly Laurence   Referring Provider: Dr. Ophelia Charter  Patient Profile: Ebony Scott is a 72 y.o. female with a history of DVT, tracheobronchomalacia,bronchiectasis, OSA, GERD, CAD s/p LAD DES (12/2020), HFrEF (LVEF 30-35% 06/2022) s/p single chamber ICD 08/2022, on chronic steroids for RA who is being seen today for the evaluation of fever post PPM insertion at the request of Dr. Ophelia Charter.  HPI:  Ebony Scott is a 72 y.o. female who presented to [facility] ER on 7/12 with reports of weakness, fever,vomiting, poor PO intake and lethargy.   On 08/27/22, the patient underwent placement of Abbott single chamber ICD.  She had no early apparent complications. After returning home, she reported feeling fatigued, poor PO intake, low grade fevers and one episode of non-bloody emesis. After vomiting, her cough was worse and she had a change in her sputum color. Per daughter, she had fevers at home to 101 for several days. She denied urinary symptoms. The patient was lethargic on am of presentation prompting EMS evaluation. On arrival they noted SBP of 60 and she was given NS with improvement in BP. ER evaluation included blood cultures, UA.  Initial labs notable for AKI 2.58 (up from 1.4), WBC 13.2, Hgb 10.1(stable). She was treated with IV vancomycin and cefepime.   She denies chest pain, palpitations, dyspnea, PND, orthopnea, nausea, vomiting, dizziness, syncope, edema, weight gain, or early satiety.   Labs Potassium4.6 (07/12 1149)   Creatinine, ser  2.58* (07/12 1149) PLT  132* (07/12 1149) HGB  10.1* (07/12 1149) WBC 13.2* (07/12 1149)  .    Past Medical History:  Diagnosis Date   Anemia    Angio-edema    Breast cancer (HCC) 1998   Left  breast, in remission   Bronchiectasis (HCC)    CHF (congestive heart failure) (HCC)    Clostridium difficile colitis 01/14/2018   Coronary artery disease    Depression    "some; don't take anything for it" (02/20/2012), pt denies dx as of 06/02/21   Diverticulosis    DVT (deep venous thrombosis) (HCC)    Fibromyalgia 11/2011   GAVE (gastric antral vascular ectasia)    GERD (gastroesophageal reflux disease)    Headache(784.0)    "related to allergies; more at different times during the year" (02/20/2012)   Hemorrhoids    Hiatal hernia    back and neck   History of blood transfusion 2023   1 unit, 5 units of Iron   Hx of adenomatous colonic polyps 04/12/2016   Hypercholesteremia    good cholesterol is high   Hypothyroidism    h/o Graves disease   IBS (irritable bowel syndrome)    Moderate persistent asthma    -FeV1 72% 2011, -IgE 102 2011, CT sinus Neg 2011   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 Casimer Lanius   Sleep apnea    uses cpap nightly   Tracheobronchomalacia      Surgical History:  Past Surgical History:  Procedure Laterality Date   ABDOMINAL HYSTERECTOMY N/A    Phreesia 10/21/2019   ANTERIOR AND POSTERIOR REPAIR  1990's  APPENDECTOMY     ARGON LASER APPLICATION  02/05/2021   Procedure: ARGON LASER APPLICATION;  Surgeon: Benancio Deeds, MD;  Location: [facility];  Service: Gastroenterology;;   BREAST LUMPECTOMY  1998   left   BREAST SURGERY N/A    Phreesia 10/21/2019   BRONCHIAL WASHINGS  04/05/2020   Procedure: BRONCHIAL WASHINGS;  Surgeon: Martina Sinner, MD;  Location: [facility];  Service: Pulmonary;;   CAPSULOTOMY Right 08/28/2021   Procedure: CAPSULOTOMY;  Surgeon: Candelaria Stagers, DPM;  Location: [facility];  Service: Podiatry;  Laterality: Right;   CARPOMETACARPEL (CMC) FUSION OF THUMB  WITH AUTOGRAFT FROM RADIUS  ~ 2009   "both thumbs" (02/20/2012)   CATARACT EXTRACTION W/ INTRAOCULAR LENS  IMPLANT, BILATERAL  2012   CERVICAL DISCECTOMY  10/2001   C5-C6   CERVICAL FUSION  2003   C3-C4   CHOLECYSTECTOMY     COLONOSCOPY     CORONARY STENT INTERVENTION N/A 01/11/2021   Procedure: CORONARY STENT INTERVENTION;  Surgeon: Marykay Lex, MD;  Location: [facility] INVASIVE CV LAB;  Service: Cardiovascular;  Laterality: N/A;   DEBRIDEMENT TENNIS ELBOW  ?1970's   right   ESOPHAGOGASTRODUODENOSCOPY     ESOPHAGOGASTRODUODENOSCOPY (EGD) WITH PROPOFOL N/A 02/05/2021   Procedure: ESOPHAGOGASTRODUODENOSCOPY (EGD) WITH PROPOFOL;  Surgeon: Benancio Deeds, MD;  Location: [facility];  Service: Gastroenterology;  Laterality: N/A;   EYE SURGERY N/A    Phreesia 10/21/2019   HAMMER TOE SURGERY Right 08/28/2021   Procedure: HAMMER TOE REPAIR 2ND TOE RIGHT FOOT;  Surgeon: Candelaria Stagers, DPM;  Location: [facility];  Service: Podiatry;  Laterality: Right;   HYSTERECTOMY     I & D EXTREMITY Right 09/22/2020   Procedure: IRRIGATION AND DEBRIDEMENT RIGHT HAND AND ELBOW;  Surgeon: Teryl Lucy, MD;  Location: [facility];  Service: Orthopedics;  Laterality: Right;   ICD IMPLANT N/A 08/27/2022   Procedure: ICD IMPLANT;  Surgeon: Maurice Small, MD;  Location: [facility];  Service: Cardiovascular;  Laterality: N/A;   KNEE ARTHROPLASTY  ?1990's   "?right; w/cartilage repair" (02/20/2012)   MASS EXCISION Left 08/28/2021   Procedure: EXCISION OF SOFT TISSUE  MASS LEFT FOOT;  Surgeon: Candelaria Stagers, DPM;  Location: [facility];  Service: Podiatry;  Laterality: Left;   NASAL SEPTUM SURGERY  1980's   POSTERIOR CERVICAL FUSION/FORAMINOTOMY  2004   "failed initial fusion; rewired  anterior neck" (02/20/2012)   REVERSE SHOULDER ARTHROPLASTY Right 02/16/2020   Procedure: REVERSE SHOULDER ARTHROPLASTY;  Surgeon: Teryl Lucy, MD;  Location: [facility];  Service: Orthopedics;  Laterality: Right;   RIGHT/LEFT HEART CATH  AND CORONARY ANGIOGRAPHY N/A 01/11/2021   Procedure: RIGHT/LEFT HEART CATH AND CORONARY ANGIOGRAPHY;  Surgeon: Marykay Lex, MD;  Location: [facility] INVASIVE CV LAB;  Service: Cardiovascular;  Laterality: N/A;   SPINE SURGERY N/A    Phreesia 10/21/2019   TONSILLECTOMY  ~ 1953   VESICOVAGINAL FISTULA CLOSURE W/ TAH  1988   VIDEO BRONCHOSCOPY Bilateral 08/23/2016   Procedure: VIDEO BRONCHOSCOPY WITH FLUORO;  Surgeon: Roslynn Amble, MD;  Location: [facility];  Service: Cardiopulmonary;  Laterality: Bilateral;   VIDEO BRONCHOSCOPY N/A 04/05/2020   Procedure: VIDEO BRONCHOSCOPY WITHOUT FLUORO;  Surgeon: Martina Sinner, MD;  Location: [facility];  Service: Pulmonary;  Laterality: N/A;     Medications Prior to Admission  Medication Sig Dispense Refill Last Dose   acetaminophen (TYLENOL) 650 MG CR tablet Take 1,300 mg by mouth 2 (two) times daily.      acetic  acid-hydrocortisone (VOSOL-HC) OTIC solution Place 4 drops into both ears See admin instructions. Chloramp/sulfu/ampho/ampho/B/H drop 100-100-5 mg Instill 4 drops into both ears the first 5 days of the month, then as directed/as needed for itching      albuterol (VENTOLIN HFA) 108 (90 Base) MCG/ACT inhaler Inhale 2 puffs into the lungs every 6 (six) hours as needed for wheezing or shortness of breath. 19 g 1    allopurinol (ZYLOPRIM) 100 MG tablet TAKE 1 TABLET BY MOUTH DAILY (Patient taking differently: Take 100 mg by mouth daily with supper.) 90 tablet 2    Alpha-D-Galactosidase (BEANO PO) Take 2-3 tablets by mouth 3 (three) times daily as needed (Gas).      Alpha-Lipoic Acid 600 MG TABS Take 600 mg by mouth daily.      ammonium lactate (AMLACTIN DAILY) 12 % lotion Apply 1 Application topically as needed for dry skin. 400 g 0    atorvastatin (LIPITOR) 80 MG tablet TAKE 1 TABLET BY MOUTH DAILY (Patient taking differently: Take 80 mg by mouth daily with supper.) 90 tablet 2    azelastine (ASTELIN) 0.1 % nasal spray Place 2 sprays into  both nostrils daily. Use in each nostril as directed (Patient taking differently: Place 2 sprays into both nostrils at bedtime as needed for allergies or rhinitis. Use in each nostril as directed) 30 mL 5    benzonatate (TESSALON) 200 MG capsule TAKE 1 CAPSULE BY MOUTH THREE TIMES DAILY AS NEEDED FOR COUGH (Patient taking differently: Take 200 mg by mouth 2 (two) times daily as needed for cough.) 30 capsule 0    budesonide (PULMICORT) 0.5 MG/2ML nebulizer solution Take 2 mLs (0.5 mg total) by nebulization 2 (two) times daily. 120 mL 11    Calcium Citrate (CITRACAL PO) Take 1 tablet by mouth daily. With magnesium      cetirizine (ZYRTEC) 10 MG tablet Take 1 tablet (10 mg total) by mouth 2 (two) times daily as needed for allergies (Can take a n extra dose during flare ups.). (Patient taking differently: Take 10 mg by mouth at bedtime.) 60 tablet 5    Cholecalciferol (VITAMIN D3) 25 MCG (1000 UT) capsule Take 1,000 Units by mouth at bedtime.      clopidogrel (PLAVIX) 75 MG tablet Take 1 tablet (75 mg total) by mouth daily. 90 tablet 3    cyproheptadine (PERIACTIN) 4 MG tablet Take 1 tablet (4 mg total) by mouth 3 (three) times daily as needed for allergies. (Patient taking differently: Take 12 mg by mouth at bedtime.) 90 tablet 5    denosumab (PROLIA) 60 MG/ML SOSY injection Inject 60 mg into the skin every 6 (six) months.      empagliflozin (JARDIANCE) 10 MG TABS tablet Take 1 tablet (10 mg total) by mouth daily before breakfast. 90 tablet 1    EPINEPHrine 0.3 mg/0.3 mL IJ SOAJ injection USE AS DIRECTED BY YOUR PHYSICIAN INTRAMUSCULARLY AS NEEDED FOR ANAPHYLAXIS (Patient taking differently: Inject 0.3 mg into the muscle as needed for anaphylaxis.) 2 each 1    famotidine (PEPCID) 20 MG tablet Take 1 tablet (20 mg total) by mouth daily. TAKE 1 TABLET BY MOUTH EACH NIGHT AT BEDTIME Strength: 20 mg 30 tablet 5    fluticasone (FLONASE) 50 MCG/ACT nasal spray USE 2 SPRAYS IN EACH NOSTRIL DAILY (Patient taking  differently: Place 2 sprays into both nostrils daily as needed for allergies.) 16 g 2    folic acid (FOLVITE) 1 MG tablet Take 1 tablet (1 mg total) by mouth  daily. 90 tablet 3    furosemide (LASIX) 40 MG tablet DAILY AS NEEDED FOR WEIGHT GAIN OF 2 POUNDS OVERNIGHT OR 5 POUNDS IN ONE WEEK 90 tablet 2    gabapentin (NEURONTIN) 300 MG capsule Take 1 capsule (300 mg total) by mouth at bedtime. (Patient taking differently: Take 300 mg by mouth at bedtime. Can take up to three time a say as needed)      GOODSENSE ARTHRITIS PAIN 1 % GEL Apply 1 Application topically 4 times daily as needed for pain. (Patient taking differently: Diclofenac) 100 g 3    guaiFENesin (MUCINEX) 600 MG 12 hr tablet Take 1 tablet (600 mg total) by mouth 2 (two) times daily as needed for cough or to loosen phlegm. (Patient taking differently: Take 600 mg by mouth 2 (two) times daily.) 60 tablet 1    HIZENTRA 10 GM/50ML SOLN Inject 65 mLs into the skin once a week. 13 g Infusion      ipratropium-albuterol (DUONEB) 0.5-2.5 (3) MG/3ML SOLN Take 3 mLs by nebulization every 6 (six) hours as needed. 360 mL 6    isosorbide-hydrALAZINE (BIDIL) 20-37.5 MG tablet Take 1 tablet by mouth 2 (two) times daily. 180 tablet 3    Lactase (LACTOSE FAST ACTING RELIEF PO) Take 3 tablets by mouth 3 (three) times daily before meals. For dairy food or drink      lidocaine-prilocaine (EMLA) cream Apply 1 application  topically as needed (pain).      magnesium oxide (MAG-OX) 400 (240 Mg) MG tablet Take 400 mg by mouth at bedtime.      Menthol, Topical Analgesic, (BIOFREEZE) 4 % GEL Apply 1 application  topically daily.      mirtazapine (REMERON SOL-TAB) 30 MG disintegrating tablet Take 1 tablet (30 mg total) by mouth at bedtime as needed (sleep). (Patient taking differently: Take 30 mg by mouth at bedtime.) 90 tablet 3    montelukast (SINGULAIR) 10 MG tablet Take 1 tablet (10 mg total) by mouth daily after supper. 90 tablet 1    mupirocin ointment  (BACTROBAN) 2 % Apply 1 Application topically at bedtime.      neomycin-polymyxin-hydrocortisone (CORTISPORIN) 3.5-10000-1 OTIC suspension Place 4 drops into both ears daily as needed (irritation in ears).      Nystatin (GERHARDT'S BUTT CREAM) CREA Apply topically 3 times a day as needed for itching or irritation 4 each 6    nystatin (MYCOSTATIN) 100000 UNIT/ML suspension Use as directed 15 mLs in the mouth or throat at bedtime as needed (if has yeast in mouth).      nystatin-triamcinolone ointment (MYCOLOG) Apply 1 Application topically daily as needed (Vaginal irritation).      potassium chloride (KLOR-CON M) 10 MEQ tablet DAILY AS NEEDED WHEN TAKING LASIX 90 tablet 1    predniSONE (DELTASONE) 1 MG tablet Take 2 tablets (2 mg total) by mouth daily with breakfast. Take along with 5 mg tablet daily. (Patient taking differently: Take 2 mg by mouth See admin instructions. Take along with 5 mg for a total of 7 mg in the morning) 180 tablet 0    predniSONE (DELTASONE) 5 MG tablet Take 1 tablet (5 mg total) by mouth daily with breakfast. (Patient taking differently: Take 5 mg by mouth See admin instructions. Take with (2) 1 mg for a total of 7 mg in the morning) 90 tablet 0    Prenatal Vit-Fe Fumarate-FA (PRENATAL VITAMINS PO) Take 1 tablet by mouth daily.      Probiotic Product (ALIGN) 4 MG CAPS  Take 4 mg by mouth daily.      Propylene Glycol (SYSTANE COMPLETE) 0.6 % SOLN Place 1 drop into both eyes 2 (two) times daily. 15 mL 5    sacubitril-valsartan (ENTRESTO) 24-26 MG Take 1 tablet by mouth 2 (two) times daily. 180 tablet 1    Tiotropium Bromide-Olodaterol (STIOLTO RESPIMAT) 2.5-2.5 MCG/ACT AERS Inhale 2 puffs into the lungs daily. (Patient taking differently: Inhale 2 puffs into the lungs at bedtime.) 4 g 11    traMADol (ULTRAM) 50 MG tablet Take 1 tablet (50 mg total) by mouth at bedtime. 30 tablet 0    Triamcinolone Acetonide (TRIAMCINOLONE 0.1 % CREAM : EUCERIN) CREA Apply 1 Application topically 2  (two) times daily. (Patient taking differently: Apply 1 Application topically daily as needed for rash or irritation.) 1 each 1    triamcinolone ointment (KENALOG) 0.5 % Apply 1 Application topically 2 (two) times daily. 30 g 1    XARELTO 20 MG TABS tablet TAKE 1 TABLET BY MOUTH DAILY WITH SUPPER (Patient taking differently: Take 20 mg by mouth daily with supper.) 90 tablet 2     Inpatient Medications:   allopurinol  100 mg Oral Q supper   arformoterol  15 mcg Nebulization BID   And   umeclidinium bromide  1 puff Inhalation Daily   atorvastatin  80 mg Oral Q supper   budesonide  0.5 mg Nebulization BID   [START ON 09/01/2022] clopidogrel  75 mg Oral Daily   cyproheptadine  12 mg Oral QHS   docusate sodium  100 mg Oral BID   famotidine  20 mg Oral QHS   gabapentin  300 mg Oral QHS   guaiFENesin  600 mg Oral BID   insulin aspart  0-5 Units Subcutaneous QHS   insulin aspart  0-9 Units Subcutaneous TID WC   [START ON 09/01/2022] isosorbide-hydrALAZINE  1 tablet Oral BID   loratadine  10 mg Oral QHS   mirtazapine  30 mg Oral QHS   montelukast  10 mg Oral QPC supper   predniSONE  2 mg Oral See admin instructions   predniSONE  5 mg Oral See admin instructions   Propylene Glycol  1 drop Both Eyes BID   rivaroxaban  20 mg Oral Q supper   sodium chloride flush  3 mL Intravenous Q12H   vancomycin variable dose per unstable renal function (pharmacist dosing)   Does not apply See admin instructions    Allergies:  Allergies  Allergen Reactions   Baclofen Anaphylaxis and Other (See Comments)    Altered mental status requiring a 3-day hospital stay- patient became unresponsive   "Put me in a coma for five days", Altered mental status requiring a 3-day hospital stay- patient became unresponsive   Dust Mite Extract Shortness Of Breath and Other (See Comments)    "sneezing" (02/20/2012) too   Molds & Smuts Shortness Of Breath   Morphine And Codeine Hives and Itching   Other Shortness Of Breath  and Other (See Comments)    Grass and weeds "sneezing; filled sinuses" (02/20/2012)   Penicillins Rash and Other (See Comments)    "welts" (02/20/2012)  Tolerated Ancef 02/16/20   Rofecoxib Swelling and Other (See Comments)    Vioxx- feet swelling   Shellfish Allergy Anaphylaxis, Shortness Of Breath, Itching and Rash   Shrimp Extract Anaphylaxis and Other (See Comments)    ALL SHELLFISH   Tetracycline Hcl Nausea And Vomiting   Tetracycline Hcl Other (See Comments)    GI Intolerance  Xolair [Omalizumab] Other (See Comments)    Caused Blood clot   Zoledronic Acid Other (See Comments)    Reclast- Fever, Put in hospital, dr said it was a reaction from a reaction    Fever, inability to walk, Put in hospital  Fever, Put in hospital, dr said it was a reaction from a reaction , Other reaction(s): Unknown   Dilaudid [Hydromorphone Hcl] Itching   Hydrocodone-Acetaminophen Itching   Levofloxacin Other (See Comments)    GI upset  Other Reaction(s): GI Intolerance, Other (See Comments)  REACTION: GI upset, GI upset   Oxycodone Hcl Nausea And Vomiting   Oxycodone Hcl Other (See Comments)    GI Intolerance   Paroxetine Nausea And Vomiting and Other (See Comments)    GI Intolerance also   Celecoxib Swelling and Other (See Comments)    Feet swelling   Diltiazem Hcl Swelling   Lactose Other (See Comments)    Bloating and gas   Lactose Intolerance (Gi) Other (See Comments)    Bloating and gas   Oxycodone-Acetaminophen Itching   Rituximab Other (See Comments)    Anemia    Tree Extract Other (See Comments)    "tested and told I was allergic to it; never experienced a reaction to it" (02/20/2012)   Gamunex [Immune Globulin] Itching and Rash    Tolerates hizentra    Penicillin G Procaine Rash and Other (See Comments)    Welts, also    Family History  Problem Relation Age of Onset   Allergies Mother    Heart disease Mother    Arthritis Mother    Lung cancer Mother        heavy smoker    Diabetes Mother    Allergies Father    Heart disease Father    Arthritis Father    Stroke Father    Heart disease Brother    Diabetes Maternal Grandfather    Colitis Daughter    Colon cancer Other        Maternal half uncle     Physical Exam: Vitals:   08/31/22 1300 08/31/22 1315 08/31/22 1330 08/31/22 1430  BP: (!) 101/49 (!) 99/48 (!) 119/52   Pulse: 75 78 82   Resp: 10 14 17    Temp:    97.7 F (36.5 C)  TempSrc:    Temporal  SpO2: 97% 96% 99%   Weight:      Height:        GEN: chronically ill appearing adult female lying in in bed in NAD, A&O x 3, normal affect HEENT: Normocephalic, atraumatic Lungs- CTAB, Normal effort.  Heart- Regular rate and rhythm, No M/G/R. L PPM site with steri strips intact, no bleeding, erythema or hematoma.  Mild ecchymosis at site.  GI- Soft, NT, ND.  Extremities- No clubbing, cyanosis, or edema   Radiology/Studies: DG Chest Port 1 View  Result Date: 08/31/2022 CLINICAL DATA:  Provided history: Questionable sepsis-evaluate for abnormality. EXAM: PORTABLE CHEST 1 VIEW COMPARISON:  Prior chest radiograph 08/27/2022 and earlier. FINDINGS: Left chest single-lead implantable cardiac device. Cardiomegaly. A nipple shadow projects over the left lung base. Small linear/bandlike opacities within the left lung base. Mild chronic elevation of the right hemidiaphragm. No appreciable airspace consolidation on the right. No evidence of pleural effusion or pneumothorax. No acute osseous abnormality identified. Incompletely imaged ACDF hardware. Metallic cerclage wires at the level of the cervicothoracic junction. Prior right shoulder arthroplasty. IMPRESSION: 1. Small linear/bandlike opacities within the left lung base. The appearance favors atelectasis. However, pneumonia  is difficult to definitively exclude. 2. Cardiomegaly. Electronically Signed   By: Jackey Loge D.O.   On: 08/31/2022 12:18   DG Chest 2 View  Result Date: 08/27/2022 CLINICAL DATA:  Cardiac  device in situ. EXAM: CHEST - 2 VIEW COMPARISON:  April 30, 2022. FINDINGS: Stable cardiomediastinal silhouette. Interval placement of single lead left-sided defibrillator with lead in grossly good position. No pneumothorax is noted. Both lungs are clear. The visualized skeletal structures are unremarkable. IMPRESSION: Interval placement of single lead left-sided defibrillator. Electronically Signed   By: Lupita Raider M.D.   On: 08/27/2022 17:32   EP PPM/ICD IMPLANT  Result Date: 08/27/2022 Table formatting from the original result was not included.  SURGEON:  York Pellant, MD    PREPROCEDURE DIAGNOSES:  1. CHFrEF    POSTPROCEDURE DIAGNOSES:  1. CHFrEF    PROCEDURES:   1. Subcutaneous ICD placement    INTRODUCTION: VALLERY KOSANKE is a 72 y.o. patient who presents to the EP lab for dual chamber permanent pacemaker implantation.    DESCRIPTION OF PROCEDURE:  Informed written consent was obtained and the patient was brought to the electrophysiology lab in the fasting state. The patient was adequately sedated with intravenous Versed, and fentanyl as outlined in the nursing report.  The patient's left chest was prepped and draped in the usual sterile fashion by the EP lab staff.  The skin overlying the left deltopectoral region was infiltrated with lidocaine for local analgesia.  An incision was created over the left deltopectoral region.  A left subcutaneous pocket was fashioned using a combination of sharp and blunt dissection immediately superficial to the pectoral fascia.  Electrocautery was used to assure hemostasis. Lead Placement: The left axillary vein was cannulated using modified seldinger techniques.  Through the left axillary vein, an RV ICD lead was advanced to the RV apex. The lead was secured to the pectoral fascia.Lead parameters are detailed below.  RV Model [id number] Serial # [id number] Amplitude 13.4 mV Threshold 1.0 V @ 0.5 S Impedence 656 ohms The pocket was irrigated with copious gentamicin  solution.  The leads were then  connected to a pulse generator (model Whittingham VR, serial [id number]). The pocket was closed in layers of absorbable suture. EBL < 10mL. Steri-strips and a sterile dressing were applied.  During this procedure the patient is administered Versed and Fentanyl by a trained provider under my direct supervision to achieve and maintain moderate conscious sedation.  The patient's heart rate, blood pressure, and oxygen saturation are monitored continuously during the procedure. The period of conscious sedation is 35 minutes, of which I was present face-to-face 100% of this time.    CONCLUSIONS:  1. Successful single chamber ICD  3.  No early apparent complications. York Pellant, MD Cardiac Electrophysiology   CT SHOULDER LEFT WO CONTRAST  Result Date: 08/17/2022 CLINICAL DATA:  Shoulder pain EXAM: CT OF THE UPPER LEFT EXTREMITY WITHOUT CONTRAST TECHNIQUE: Multidetector CT imaging of the upper left extremity was performed according to the standard protocol. RADIATION DOSE REDUCTION: This exam was performed according to the departmental dose-optimization program which includes automated exposure control, adjustment of the mA and/or kV according to patient size and/or use of iterative reconstruction technique. COMPARISON:  X-ray 03/07/2022, MRI 07/28/2022 FINDINGS: Bones/Joint/Cartilage Subchondral fracture of the posteromedial aspect of the humeral head involving a 1.4 x 1.3 cm area of bone. No additional fracture. No dislocation. Severe glenohumeral joint osteoarthritis manifested by joint space narrowing, subchondral sclerosis, and marginal osteophyte formation. Small-moderate-sized glenohumeral joint  effusion. Moderate degenerative changes of the acromioclavicular joint. Moderate-large volume subacromial-subdeltoid bursal fluid collection. Remaining visualized osseous structures appear grossly intact. Ligaments Suboptimally assessed by CT. Muscles and Tendons Rotator cuff tears better  assessed on recent MRI. Associated rotator cuff muscle atrophy. Soft tissues No soft tissue fluid collection or hematoma. No axillary lymphadenopathy. Visualized lung field is clear. Aortic and coronary artery atherosclerosis. IMPRESSION: 1. Subchondral fracture of the posteromedial aspect of the humeral head. 2. Severe glenohumeral joint osteoarthritis. 3. Moderate-large volume subacromial-subdeltoid bursal fluid collection. 4. Moderate degenerative changes of the acromioclavicular joint. 5. Aortic and coronary artery atherosclerosis (ICD10-I70.0). Electronically Signed   By: Duanne Guess D.O.   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 There is moderate hypokinesis of the LV globally, G1 DD  Assessment/Plan:  ICD in situ  Single chamber inserted on 7/8 per Dr. Nelly Laurence.  -site without overt evidence of infection  -follow up blood cultures given immune suppression -continue L arm precautions, keep steri strips in place -if blood cultures return positive, device would need to be removed & consideration for other means of primary prevention   Fever Brief Hypotension vs Inaccurate Measurement- responded to NS? (Initial BP in ER 101 systolic) Immune Suppression  Vomiting / Poor PO Intake  -infectious work up per [facility]  -empiric abx given immune suppression   AKI  -per [facility]   Lethargy -per [facility]      For questions or updates, please contact [facility] Please consult [email or web address] for contact info under  Cardiology/STEMI.  Signed, Canary Brim, MSN, APRN, NP-C, AGACNP-BC [facility] - Electrophysiology  08/31/2022, 2:48 PM

## 2022-08-31 NOTE — ED Notes (Signed)
ED TO INPATIENT HANDOFF REPORT  ED Nurse Name and Phone #: Percival Spanish [phone]  S Name/Age/Gender Ebony Scott 72 y.o. female Room/Bed: RESUSC/RESUSC  Code Status   Code Status: Prior  Home/SNF/Other Home Patient oriented to: self, place, time, and situation Is this baseline? Yes   Triage Complete: Triage complete  Chief Complaint Sepsis due to undetermined organism [name] [A41.9]  Triage Note Pt arrived via [facility] from home for c/o lethargic since pacemaker placement on Monday. Per daughter pt has had decreased PO intake, and vomited Tuesday night and daughter thinks she may have vomited, because pt has had productive cough w/yellow mucous since vomiting. Per EMS initial bp was 64/30. EMS gave NS 200 mL. Pt is A&Ox4.   Allergies Allergies  Allergen Reactions   Baclofen Anaphylaxis and Other (See Comments)    Altered mental status requiring a 3-day hospital stay- patient became unresponsive   "Put me in a coma for five days", Altered mental status requiring a 3-day hospital stay- patient became unresponsive   Dust Mite Extract Shortness Of Breath and Other (See Comments)    "sneezing" (02/20/2012) too   Molds & Smuts Shortness Of Breath   Morphine And Codeine Hives and Itching   Other Shortness Of Breath and Other (See Comments)    Grass and weeds "sneezing; filled sinuses" (02/20/2012)   Penicillins Rash and Other (See Comments)    "welts" (02/20/2012)  Tolerated Ancef 02/16/20   Rofecoxib Swelling and Other (See Comments)    Vioxx- feet swelling   Shellfish Allergy Anaphylaxis, Shortness Of Breath, Itching and Rash   Shrimp Extract Anaphylaxis and Other (See Comments)    ALL SHELLFISH   Tetracycline Hcl Nausea And Vomiting   Tetracycline Hcl Other (See Comments)    GI Intolerance   Xolair [Omalizumab] Other (See Comments)    Caused Blood clot   Zoledronic Acid Other (See Comments)    Reclast- Fever, Put in hospital, dr said it was a reaction from a reaction    Fever,  inability to walk, Put in hospital  Fever, Put in hospital, dr said it was a reaction from a reaction , Other reaction(s): Unknown   Dilaudid [Hydromorphone Hcl] Itching   Hydrocodone-Acetaminophen Itching   Levofloxacin Other (See Comments)    GI upset  Other Reaction(s): GI Intolerance, Other (See Comments)  REACTION: GI upset, GI upset   Oxycodone Hcl Nausea And Vomiting   Oxycodone Hcl Other (See Comments)    GI Intolerance   Paroxetine Nausea And Vomiting and Other (See Comments)    GI Intolerance also   Celecoxib Swelling and Other (See Comments)    Feet swelling   Diltiazem Hcl Swelling   Lactose Other (See Comments)    Bloating and gas   Lactose Intolerance (Gi) Other (See Comments)    Bloating and gas   Oxycodone-Acetaminophen Itching   Rituximab Other (See Comments)    Anemia    Tree Extract Other (See Comments)    "tested and told I was allergic to it; never experienced a reaction to it" (02/20/2012)   Gamunex [Immune Globulin] Itching and Rash    Tolerates hizentra    Penicillin G Procaine Rash and Other (See Comments)    Welts, also    Level of Care/Admitting Diagnosis ED Disposition     ED Disposition  Admit   Condition  --   Comment  Hospital [facility] [[id number]]  Level of Care: Progressive [102]  Admit to Progressive based on following criteria: MULTISYSTEM  THREATS such as stable sepsis, metabolic/electrolyte imbalance with or without encephalopathy that is responding to early treatment.  May [facility] patient to [facility] or [facility] if equivalent level of care is available:: Yes  Covid Evaluation: Confirmed COVID Negative  Diagnosis: Sepsis due to undetermined organism [facility]) [[id number]]  Admitting Physician: Jonah Blue [[id number]]  Attending Physician: Jonah Blue [[id number]]  Certification:: I certify this patient will need inpatient services for at least 2 midnights  Estimated Length of Stay: 3          B Medical/Surgery  History Past Medical History:  Diagnosis Date   Anemia    Angio-edema    Breast cancer (HCC) 1998   Left breast, in remission   Bronchiectasis (HCC)    CHF (congestive heart failure) (HCC)    Clostridium difficile colitis 01/14/2018   Coronary artery disease    Depression    "some; don't take anything for it" (02/20/2012), pt denies dx as of 06/02/21   Diverticulosis    DVT (deep venous thrombosis) (HCC)    Fibromyalgia 11/2011   GAVE (gastric antral vascular ectasia)    GERD (gastroesophageal reflux disease)    Headache(784.0)    "related to allergies; more at different times during the year" (02/20/2012)   Hemorrhoids    Hiatal hernia    back and neck   History of blood transfusion 2023   1 unit, 5 units of Iron   Hx of adenomatous colonic polyps 04/12/2016   Hypercholesteremia    good cholesterol is high   Hypothyroidism    h/o Graves disease   IBS (irritable bowel syndrome)    Moderate persistent asthma    -FeV1 72% 2011, -IgE 102 2011, CT sinus Neg 2011   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 Casimer Lanius   Sleep apnea    uses cpap nightly   Tracheobronchomalacia    Past Surgical History:  Procedure Laterality Date   ABDOMINAL HYSTERECTOMY N/A    Phreesia 10/21/2019   ANTERIOR AND POSTERIOR REPAIR  1990's   APPENDECTOMY     ARGON LASER APPLICATION  02/05/2021   Procedure: ARGON LASER APPLICATION;  Surgeon: Benancio Deeds, MD;  Location: [facility];  Service: Gastroenterology;;   BREAST LUMPECTOMY  1998   left   BREAST SURGERY N/A    Phreesia 10/21/2019   BRONCHIAL WASHINGS  04/05/2020   Procedure: BRONCHIAL WASHINGS;  Surgeon: Martina Sinner, MD;  Location: [facility];  Service: Pulmonary;;   CAPSULOTOMY Right 08/28/2021   Procedure: CAPSULOTOMY;  Surgeon: Candelaria Stagers, DPM;  [facility];  Service: Podiatry;  Laterality: Right;   CARPOMETACARPEL (CMC) FUSION OF THUMB WITH AUTOGRAFT FROM RADIUS  ~ 2009   "both thumbs" (02/20/2012)   CATARACT EXTRACTION W/ INTRAOCULAR LENS  IMPLANT, BILATERAL  2012   CERVICAL DISCECTOMY  10/2001   C5-C6   CERVICAL FUSION  2003   C3-C4   CHOLECYSTECTOMY     COLONOSCOPY     CORONARY STENT INTERVENTION N/A 01/11/2021   Procedure: CORONARY STENT INTERVENTION;  Surgeon: Marykay Lex, MD;  Location: [facility] INVASIVE CV LAB;  Service: Cardiovascular;  Laterality: N/A;   DEBRIDEMENT TENNIS ELBOW  ?1970's   right   ESOPHAGOGASTRODUODENOSCOPY     ESOPHAGOGASTRODUODENOSCOPY (EGD) WITH PROPOFOL N/A 02/05/2021   Procedure: ESOPHAGOGASTRODUODENOSCOPY (EGD) WITH PROPOFOL;  Surgeon: Adela Lank,  Willaim Rayas, MD;  Location: [facility];  Service: Gastroenterology;  Laterality: N/A;   EYE SURGERY N/A    Phreesia 10/21/2019   HAMMER TOE SURGERY Right 08/28/2021   Procedure: HAMMER TOE REPAIR 2ND TOE RIGHT FOOT;  Surgeon: Candelaria Stagers, DPM;  [facility]: [facility];  Service: Podiatry;  Laterality: Right;   HYSTERECTOMY     I & D EXTREMITY Right 09/22/2020   Procedure: IRRIGATION AND DEBRIDEMENT RIGHT HAND AND ELBOW;  Surgeon: Teryl Lucy, MD;  Location: [facility];  Service: Orthopedics;  Laterality: Right;   ICD IMPLANT N/A 08/27/2022   Procedure: ICD IMPLANT;  Surgeon: Maurice Small, MD;  [facility]: [facility] CV LAB;  Service: Cardiovascular;  Laterality: N/A;   KNEE ARTHROPLASTY  ?1990's   "?right; w/cartilage repair" (02/20/2012)   MASS EXCISION Left 08/28/2021   Procedure: EXCISION OF SOFT TISSUE  MASS LEFT FOOT;  Surgeon: Candelaria Stagers, DPM;  [facility];  Service: Podiatry;  Laterality: Left;   NASAL SEPTUM SURGERY  1980's   POSTERIOR CERVICAL FUSION/FORAMINOTOMY  2004   "failed initial fusion; rewired  anterior neck" (02/20/2012)   REVERSE SHOULDER ARTHROPLASTY Right 02/16/2020   Procedure: REVERSE SHOULDER ARTHROPLASTY;  Surgeon: Teryl Lucy, MD;   Location: [facility];  Service: Orthopedics;  Laterality: Right;   RIGHT/LEFT HEART CATH AND CORONARY ANGIOGRAPHY N/A 01/11/2021   Procedure: RIGHT/LEFT HEART CATH AND CORONARY ANGIOGRAPHY;  Surgeon: Marykay Lex, MD;  Location: [facility] INVASIVE CV LAB;  Service: Cardiovascular;  Laterality: N/A;   SPINE SURGERY N/A    Phreesia 10/21/2019   TONSILLECTOMY  ~ 1953   VESICOVAGINAL FISTULA CLOSURE W/ TAH  1988   VIDEO BRONCHOSCOPY Bilateral 08/23/2016   Procedure: VIDEO BRONCHOSCOPY WITH FLUORO;  Surgeon: Roslynn Amble, MD;  Location: [facility];  Service: Cardiopulmonary;  Laterality: Bilateral;   VIDEO BRONCHOSCOPY N/A 04/05/2020   Procedure: VIDEO BRONCHOSCOPY WITHOUT FLUORO;  Surgeon: Martina Sinner, MD;  Location: [facility];  Service: Pulmonary;  Laterality: N/A;     A IV Location/Drains/Wounds Patient Lines/Drains/Airways Status     Active Line/Drains/Airways     Name Placement date Placement time Site Days   Peripheral IV 08/31/22 18 G Left Antecubital 08/31/22  --  Antecubital  less than 1   Peripheral IV 08/31/22 20 G Right Antecubital 08/31/22  1218  Antecubital  less than 1   Wound / Incision (Open or Dehisced) 02/26/20 (MASD) Moisture Associated Skin Damage Buttocks Bilateral Redness, excoration 02/26/20  2030  Buttocks  917   Wound / Incision (Open or Dehisced) 09/22/20 Other (Comment) Toe (Comment  which one) Anterior;Right Broken toe s/p surgery 09/22/20  2350  Toe (Comment  which one)  708            Intake/Output Last 24 hours  Intake/Output Summary (Last 24 hours) at 08/31/2022 1406 Last data filed at 08/31/2022 1340 Gross per 24 hour  Intake 600.14 ml  Output --  Net 600.14 ml    Labs/Imaging Results for orders placed or performed during the hospital encounter of 08/31/22 (from the past 48 hour(s))  Comprehensive metabolic panel     Status: Abnormal   Collection Time: 08/31/22 11:49 AM  Result Value Ref Range   Sodium 137 135 - 145 mmol/L   Potassium  4.6 3.5 - 5.1 mmol/L   Chloride 107 98 - 111 mmol/L   CO2 21 (L) 22 - 32 mmol/L   Glucose, Bld 147 (H) 70 - 99 mg/dL    Comment: Glucose reference range  applies only to samples taken after fasting for at least 8 hours.   BUN 36 (H) 8 - 23 mg/dL   Creatinine, Ser 1.61 (H) 0.44 - 1.00 mg/dL   Calcium 8.8 (L) 8.9 - 10.3 mg/dL   Total Protein 6.5 6.5 - 8.1 g/dL   Albumin 2.7 (L) 3.5 - 5.0 g/dL   AST 37 15 - 41 U/L   ALT 24 0 - 44 U/L   Alkaline Phosphatase 49 38 - 126 U/L   Total Bilirubin 0.5 0.3 - 1.2 mg/dL   GFR, Estimated 19 (L) >60 mL/min    Comment: (NOTE) Calculated using the CKD-EPI Creatinine Equation (2021)    Anion gap 9 5 - 15    Comment: Performed at [facility], [address] [place], [place] [address]  CBC with Differential     Status: Abnormal   Collection Time: 08/31/22 11:49 AM  Result Value Ref Range   WBC 13.2 (H) 4.0 - 10.5 K/uL   RBC 2.83 (L) 3.87 - 5.11 MIL/uL   Hemoglobin 10.1 (L) 12.0 - 15.0 g/dL   HCT 54.0 (L) 98.1 - 19.1 %   MCV 110.2 (H) 80.0 - 100.0 fL   MCH 35.7 (H) 26.0 - 34.0 pg   MCHC 32.4 30.0 - 36.0 g/dL   RDW 47.8 29.5 - 62.1 %   Platelets 132 (L) 150 - 400 K/uL   nRBC 0.0 0.0 - 0.2 %   Neutrophils Relative % 75 %   Neutro Abs 9.9 (H) 1.7 - 7.7 K/uL   Lymphocytes Relative 7 %   Lymphs Abs 0.9 0.7 - 4.0 K/uL   Monocytes Relative 18 %   Monocytes Absolute 2.4 (H) 0.1 - 1.0 K/uL   Eosinophils Relative 0 %   Eosinophils Absolute 0.0 0.0 - 0.5 K/uL   Basophils Relative 0 %   Basophils Absolute 0.0 0.0 - 0.1 K/uL   nRBC 0 0 /100 WBC   Abs Immature Granulocytes 0.00 0.00 - 0.07 K/uL    Comment: Performed at [facility], [address] [place], [place] [address]  Protime-INR     Status: Abnormal   Collection Time: 08/31/22 11:49 AM  Result Value Ref Range   Prothrombin Time 18.7 (H) 11.4 - 15.2 seconds   INR 1.5 (H) 0.8 - 1.2    Comment: (NOTE) INR goal varies based on device and disease states. Performed at [facility], [address], [place], [place] [address]   APTT     Status: Abnormal   Collection Time: 08/31/22 11:49 AM  Result Value Ref Range   aPTT 37 (H) 24 - 36 seconds    Comment:        IF BASELINE aPTT IS ELEVATED, SUGGEST PATIENT RISK ASSESSMENT BE USED TO DETERMINE APPROPRIATE ANTICOAGULANT THERAPY. Performed at [facility], [address], [place], [place] [address]   Resp panel by RT-PCR (RSV, Flu A&B, Covid) Anterior Nasal Swab     Status: None   Collection Time: 08/31/22 11:49 AM   Specimen: Anterior Nasal Swab  Result Value Ref Range   SARS Coronavirus 2 by RT PCR NEGATIVE NEGATIVE   Influenza A by PCR NEGATIVE NEGATIVE   Influenza B by PCR NEGATIVE NEGATIVE    Comment: (NOTE) The Xpert Xpress SARS-CoV-2/FLU/RSV plus assay is intended as an aid in the diagnosis of influenza from Nasopharyngeal swab specimens and should not be used as a sole basis for treatment. Nasal washings and aspirates are unacceptable  for Xpert Xpress SARS-CoV-2/FLU/RSV testing.  Fact Sheet for Patients: BloggerCourse.com  Fact Sheet for Healthcare Providers: SeriousBroker.it  This test is not yet approved or cleared by the [place] FDA and has been authorized for detection and/or diagnosis of SARS-CoV-2 by FDA under an Emergency Use Authorization (EUA). This EUA will remain in effect (meaning this test can be used) for the duration of the COVID-19 declaration under Section 564(b)(1) of the Act, 21 U.S.C. section 360bbb-3(b)(1), unless the authorization is terminated or revoked.     Resp Syncytial Virus by PCR NEGATIVE NEGATIVE    Comment: (NOTE) Fact Sheet for Patients: BloggerCourse.com  Fact Sheet for Healthcare Providers: SeriousBroker.it  This test is not yet approved or cleared by the [place] FDA and has been authorized for detection and/or diagnosis of  SARS-CoV-2 by FDA under an Emergency Use Authorization (EUA). This EUA will remain in effect (meaning this test can be used) for the duration of the COVID-19 declaration under Section 564(b)(1) of the Act, 21 U.S.C. section 360bbb-3(b)(1), unless the authorization is terminated or revoked.  Performed at [facility], [address], [place], [place] [address]   I-Stat Lactic Acid, ED     Status: None   Collection Time: 08/31/22 12:24 PM  Result Value Ref Range   Lactic Acid, Venous 1.7 0.5 - 1.9 mmol/L   *Note: Due to a large number of results and/or encounters for the requested time period, some results have not been displayed. A complete set of results can be found in Results Review.   DG Chest Port 1 View  Result Date: 08/31/2022 CLINICAL DATA:  Provided history: Questionable sepsis-evaluate for abnormality. EXAM: PORTABLE CHEST 1 VIEW COMPARISON:  Prior chest radiograph 08/27/2022 and earlier. FINDINGS: Left chest single-lead implantable cardiac device. Cardiomegaly. A nipple shadow projects over the left lung base. Small linear/bandlike opacities within the left lung base. Mild chronic elevation of the right hemidiaphragm. No appreciable airspace consolidation on the right. No evidence of pleural effusion or pneumothorax. No acute osseous abnormality identified. Incompletely imaged ACDF hardware. Metallic cerclage wires at the level of the cervicothoracic junction. Prior right shoulder arthroplasty. IMPRESSION: 1. Small linear/bandlike opacities within the left lung base. The appearance favors atelectasis. However, pneumonia is difficult to definitively exclude. 2. Cardiomegaly. Electronically Signed   By: Jackey Loge D.O.   On: 08/31/2022 12:18    Pending Labs Unresulted Labs (From admission, onward)     Start     Ordered   08/31/22 1149  Blood Culture (routine x 2)  (Undifferentiated presentation (screening labs and basic nursing orders))  BLOOD CULTURE X 2,   STAT       08/31/22 1149   08/31/22 1149  Urinalysis, w/ Reflex to Culture (Infection Suspected) -Urine, Clean Catch  (Undifferentiated presentation (screening labs and basic nursing orders))  ONCE - URGENT,   URGENT       Question:  Specimen Source  Answer:  Urine, Clean Catch   08/31/22 1149            Vitals/Pain Today's Vitals   08/31/22 1245 08/31/22 1300 08/31/22 1315 08/31/22 1330  BP: (!) 99/46 (!) 101/49 (!) 99/48 (!) 119/52  Pulse: 76 75 78 82  Resp: 18 10 14 17   Temp:      TempSrc:      SpO2: 98% 97% 96% 99%  Weight:      Height:      PainSc:        Isolation Precautions No active isolations  Medications Medications  vancomycin (VANCOREADY) IVPB 1250 mg/250 mL (1,250 mg Intravenous New Bag/Given 08/31/22 1332)  vancomycin variable dose per unstable renal function (pharmacist dosing) (has no administration in time range)  ceFEPIme (MAXIPIME) 2 g in sodium chloride 0.9 % 100 mL IVPB (0 g Intravenous Stopped 08/31/22 1340)  sodium chloride 0.9 % bolus 500 mL (0 mLs Intravenous Stopped 08/31/22 1332)    Mobility Normally walks, but has been weak      Focused Assessments Pulmonary Assessment Handoff:  Lung sounds: Bilateral Breath Sounds: Diminished (lower lobes) O2 Device: Room Air      R Recommendations: See Admitting Provider Note  Report given to:   Additional Notes:

## 2022-08-31 NOTE — ED Triage Notes (Addendum)
Pt arrived via [facility] from home for c/o lethargic since pacemaker placement on Monday. Per daughter pt has had decreased PO intake, and vomited Tuesday night and daughter thinks she may have aspirated because pt has had productive cough w/yellow mucous since vomiting. Per EMS initial bp was 64/30. EMS gave NS 200 mL. Pt is A&Ox4.

## 2022-08-31 NOTE — ED Notes (Signed)
I called the cath lab, because pt has an [employer] ICD and we don't have an interrogator for that in the ED. The cath lab said they will contact the [employer] rep and let them know so they can come evaluate ICD.

## 2022-08-31 NOTE — Progress Notes (Signed)
Initial Nutrition Assessment  DOCUMENTATION CODES:   Not applicable  INTERVENTION:   -Liberalize diet to 2 gram sodium for wider variety of meal selections -MVI with minerals daily -Ensure Enlive po TID, each supplement provides 350 kcal and 20 grams of protein  NUTRITION DIAGNOSIS:   Increased nutrient needs related to chronic illness (CKD) as evidenced by estimated needs.  GOAL:   Patient will meet greater than or equal to 90% of their needs  MONITOR:   PO intake, Supplement acceptance  REASON FOR ASSESSMENT:   Consult Assessment of nutrition requirement/status  ASSESSMENT:   Pt with significant past medical history including ischemic congestive cardiomyopathy, CKD stage III, DVT and PE anticoagulated on Xarelto, who presents with altered mental status.  Pt admitted with AMS.   7/8- s/p ICD placement  Pt unavailable at time of visit. Attempted to speak with pt via call to hospital room phone, however, unable to reach. RD unable to obtain further nutrition-related history or complete nutrition-focused physical exam at this time.    Per H&P, pt with decreased oral intake since 08/27/22. She also reports confusion and low grade fever since that time.   Pt currently on a heart healthy, carb modified diet. No meal completion data available to assess at this time.   Reviewed wt hx; pt has experienced a 9% wt loss over the past month, which is significant for time frame. Per nursing assessment, pt also with mild edema, which may be masking further weight loss as well as fat and muscle depletions. Pt is at high risk for malnutrition, however, unable to identify at this time.    Medications reviewed and include colace, pepcid, neurontin, remeron, prednisone, and lactated ringers infusion @ 50 ml/hr.   Lab Results  Component Value Date   HGBA1C 6.3 (H) 05/29/2022   PTA DM medications are none.   Labs reviewed: CBGS: 84 (inpatient orders for glycemic control are 0-5 units  insulin aspart daily at bedtime and 0-9 units insulin aspart TID with meals).    Diet Order:   Diet Order             Diet heart healthy/carb modified Room service appropriate? Yes; Fluid consistency: Thin  Diet effective now                   EDUCATION NEEDS:   No education needs have been identified at this time  Skin:  Skin Assessment: Reviewed RN Assessment  Last BM:  08/30/22  Height:   Ht Readings from Last 1 Encounters:  08/31/22 5' (1.524 m)    Weight:   Wt Readings from Last 1 Encounters:  08/31/22 58.6 kg    Ideal Body Weight:  45.5 kg  BMI:  Body mass index is 25.23 kg/m.  Estimated Nutritional Needs:   Kcal:  1600-1800  Protein:  80-95 grams  Fluid:  > 1.6 L    Levada Schilling, RD, LDN, CDCES Registered Dietitian II Certified Diabetes Care and Education Specialist Please refer to [facility] for RD and/or RD on-call/weekend/after hours pager

## 2022-08-31 NOTE — Telephone Encounter (Signed)
Fax received from Dr. Dion Saucier with [facility] to perform a Left Reverse Total Shoulder Replacement on patient.  Patient needs surgery clearance. Surgery is Pending. Patient was seen on 07/20/22. Office protocol is a risk assessment can be sent to surgeon if patient has been seen in 60 days or less.   Sending to Dr. Francine Graven for risk assessment or recommendations if patient needs to be seen in office prior to surgical procedure.

## 2022-08-31 NOTE — H&P (Addendum)
History and Physical    Patient: Ebony Scott:[id number] DOB: Sep 23, 1950 DOA: 08/31/2022 DOS: the patient was seen and examined on 08/31/2022 PCP: [name], MD  Patient coming from: Home; NOK: Daughter, Carollee Mumma, [phone] (RN at [facility])   Chief Complaint: lethargy  HPI: Ebony Scott is a 72 y.o. female with medical history significant of DVT, remote breast cancer, bronchiectasis, chronic systolic CHF, fibromyalgia, GAVE, HLD, hypothyroidism, and RA presenting with fatigue and poor PO intake following AICD insertion Monday (7/8).   Since then, she has been lethargic with poor PO intake, "low grade fever" <100.4.  She had 1 episode of bilious emesis a few days ago.  Chronic cough from bronchiectasis without overt change.  No frank urinary symptoms other than 1 episode of incontinence (has made very little urine).     ER Course:  Sepsis.  Had AICD placed Monday, hasn't been eating/drinking since.  Mental decline, low-grade fever.  BP 60 systolic with EMS.  Given 200 cc with improvement to 80 systolic.  + cough, mildly increased SOB.  Bilious vomiting the day after procedure, ?aspiration.  Creatinine increased.  ?PNA on CXR.  UA is pending, no urinary symptoms.  Surgical site looks ok.  BP ok now.  Given Vanc and Cefepime.     Review of Systems: As mentioned in the history of present illness. All other systems reviewed and are negative. Past Medical History:  Diagnosis Date   Anemia    Angio-edema    Breast cancer (HCC) 1998   Left breast, in remission   Bronchiectasis (HCC)    CHF (congestive heart failure) (HCC)    Clostridium difficile colitis 01/14/2018   Coronary artery disease    Depression    "some; don't take anything for it" (02/20/2012), pt denies dx as of 06/02/21   Diverticulosis    DVT (deep venous thrombosis) (HCC)    Fibromyalgia 11/2011   GAVE (gastric antral vascular ectasia)    GERD (gastroesophageal reflux disease)    Headache(784.0)    "related to  allergies; more at different times during the year" (02/20/2012)   Hemorrhoids    Hiatal hernia    back and neck   History of blood transfusion 2023   1 unit, 5 units of Iron   Hx of adenomatous colonic polyps 04/12/2016   Hypercholesteremia    good cholesterol is high   Hypothyroidism    h/o Graves disease   IBS (irritable bowel syndrome)    Moderate persistent asthma    -FeV1 72% 2011, -IgE 102 2011, CT sinus Neg 2011   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 Casimer Lanius   Sleep apnea    uses cpap nightly   Tracheobronchomalacia    Past Surgical History:  Procedure Laterality Date   ABDOMINAL HYSTERECTOMY N/A    Phreesia 10/21/2019   ANTERIOR AND POSTERIOR REPAIR  1990's   APPENDECTOMY     ARGON LASER APPLICATION  02/05/2021   Procedure: ARGON LASER APPLICATION;  Surgeon: Benancio Deeds, MD;  Location: [facility];  Service: Gastroenterology;;   BREAST LUMPECTOMY  1998   left   BREAST SURGERY N/A    Phreesia 10/21/2019   BRONCHIAL WASHINGS  04/05/2020   Procedure: BRONCHIAL WASHINGS;  Surgeon: Martina Sinner, MD;  Location: [facility];  Service: Pulmonary;;   CAPSULOTOMY  Right 08/28/2021   Procedure: CAPSULOTOMY;  Surgeon: Candelaria Stagers, DPM;  Location: [facility];  Service: Podiatry;  Laterality: Right;   CARPOMETACARPEL (CMC) FUSION OF THUMB WITH AUTOGRAFT FROM RADIUS  ~ 2009   "both thumbs" (02/20/2012)   CATARACT EXTRACTION W/ INTRAOCULAR LENS  IMPLANT, BILATERAL  2012   CERVICAL DISCECTOMY  10/2001   C5-C6   CERVICAL FUSION  2003   C3-C4   CHOLECYSTECTOMY     COLONOSCOPY     CORONARY STENT INTERVENTION N/A 01/11/2021   Procedure: CORONARY STENT INTERVENTION;  Surgeon: Marykay Lex, MD;  Location: [facility] INVASIVE CV LAB;  Service: Cardiovascular;  Laterality: N/A;   DEBRIDEMENT TENNIS ELBOW  ?1970's    right   ESOPHAGOGASTRODUODENOSCOPY     ESOPHAGOGASTRODUODENOSCOPY (EGD) WITH PROPOFOL N/A 02/05/2021   Procedure: ESOPHAGOGASTRODUODENOSCOPY (EGD) WITH PROPOFOL;  Surgeon: Benancio Deeds, MD;  Location: [facility];  Service: Gastroenterology;  Laterality: N/A;   EYE SURGERY N/A    Phreesia 10/21/2019   HAMMER TOE SURGERY Right 08/28/2021   Procedure: HAMMER TOE REPAIR 2ND TOE RIGHT FOOT;  Surgeon: Candelaria Stagers, DPM;  Location: [facility];  Service: Podiatry;  Laterality: Right;   HYSTERECTOMY     I & D EXTREMITY Right 09/22/2020   Procedure: IRRIGATION AND DEBRIDEMENT RIGHT HAND AND ELBOW;  Surgeon: Teryl Lucy, MD;  Location: [facility];  Service: Orthopedics;  Laterality: Right;   ICD IMPLANT N/A 08/27/2022   Procedure: ICD IMPLANT;  Surgeon: Maurice Small, MD;  Location: [facility];  Service: Cardiovascular;  Laterality: N/A;   KNEE ARTHROPLASTY  ?1990's   "?right; w/cartilage repair" (02/20/2012)   MASS EXCISION Left 08/28/2021   Procedure: EXCISION OF SOFT TISSUE  MASS LEFT FOOT;  Surgeon: Candelaria Stagers, DPM;  Location: [facility];  Service: Podiatry;  Laterality: Left;   NASAL SEPTUM SURGERY  1980's   POSTERIOR CERVICAL FUSION/FORAMINOTOMY  2004   "failed initial fusion; rewired  anterior neck" (02/20/2012)   REVERSE SHOULDER ARTHROPLASTY Right 02/16/2020   Procedure: REVERSE SHOULDER ARTHROPLASTY;  Surgeon: Teryl Lucy, MD;  Location: [facility];  Service: Orthopedics;  Laterality: Right;   RIGHT/LEFT HEART CATH AND CORONARY ANGIOGRAPHY N/A 01/11/2021   Procedure: RIGHT/LEFT HEART CATH AND CORONARY ANGIOGRAPHY;  Surgeon: Marykay Lex, MD;  Location: [facility] INVASIVE CV LAB;  Service: Cardiovascular;  Laterality: N/A;   SPINE SURGERY N/A    Phreesia 10/21/2019   TONSILLECTOMY  ~ 1953   VESICOVAGINAL FISTULA CLOSURE W/ TAH  1988   VIDEO BRONCHOSCOPY Bilateral 08/23/2016   Procedure: VIDEO BRONCHOSCOPY WITH FLUORO;  Surgeon: Roslynn Amble, MD;  Location: [facility];   Service: Cardiopulmonary;  Laterality: Bilateral;   VIDEO BRONCHOSCOPY N/A 04/05/2020   Procedure: VIDEO BRONCHOSCOPY WITHOUT FLUORO;  Surgeon: Martina Sinner, MD;  Location: [facility];  Service: Pulmonary;  Laterality: N/A;   Social History:  reports that she has never smoked. She has been exposed to tobacco smoke. She has never used smokeless tobacco. She reports that she does not currently use alcohol. She reports that she does not use drugs.  Allergies  Allergen Reactions   Baclofen Anaphylaxis and Other (See Comments)    Altered mental status requiring a 3-day hospital stay- patient became unresponsive   "Put me in a coma for five days", Altered mental status requiring a 3-day hospital stay- patient became unresponsive   Dust Mite Extract Shortness Of Breath and Other (See Comments)    "sneezing" (02/20/2012) too   Molds & Smuts Shortness  Of Breath   Morphine And Codeine Hives and Itching   Other Shortness Of Breath and Other (See Comments)    Grass and weeds "sneezing; filled sinuses" (02/20/2012)   Penicillins Rash and Other (See Comments)    "welts" (02/20/2012)  Tolerated Ancef 02/16/20   Rofecoxib Swelling and Other (See Comments)    Vioxx- feet swelling   Shellfish Allergy Anaphylaxis, Shortness Of Breath, Itching and Rash   Shrimp Extract Anaphylaxis and Other (See Comments)    ALL SHELLFISH   Tetracycline Hcl Nausea And Vomiting   Tetracycline Hcl Other (See Comments)    GI Intolerance   Xolair [Omalizumab] Other (See Comments)    Caused Blood clot   Zoledronic Acid Other (See Comments)    Reclast- Fever, Put in hospital, dr said it was a reaction from a reaction    Fever, inability to walk, Put in hospital  Fever, Put in hospital, dr said it was a reaction from a reaction , Other reaction(s): Unknown   Dilaudid [Hydromorphone Hcl] Itching   Hydrocodone-Acetaminophen Itching   Levofloxacin Other (See Comments)    GI upset  Other Reaction(s): GI Intolerance,  Other (See Comments)  REACTION: GI upset, GI upset   Oxycodone Hcl Nausea And Vomiting   Oxycodone Hcl Other (See Comments)    GI Intolerance   Paroxetine Nausea And Vomiting and Other (See Comments)    GI Intolerance also   Celecoxib Swelling and Other (See Comments)    Feet swelling   Diltiazem Hcl Swelling   Lactose Other (See Comments)    Bloating and gas   Lactose Intolerance (Gi) Other (See Comments)    Bloating and gas   Oxycodone-Acetaminophen Itching   Rituximab Other (See Comments)    Anemia    Tree Extract Other (See Comments)    "tested and told I was allergic to it; never experienced a reaction to it" (02/20/2012)   Gamunex [Immune Globulin] Itching and Rash    Tolerates hizentra    Penicillin G Procaine Rash and Other (See Comments)    Welts, also    Family History  Problem Relation Age of Onset   Allergies Mother    Heart disease Mother    Arthritis Mother    Lung cancer Mother        heavy smoker   Diabetes Mother    Allergies Father    Heart disease Father    Arthritis Father    Stroke Father    Heart disease Brother    Diabetes Maternal Grandfather    Colitis Daughter    Colon cancer Other        Maternal half uncle    Prior to Admission medications   Medication Sig Start Date End Date Taking? Authorizing Provider  acetaminophen (TYLENOL) 650 MG CR tablet Take 1,300 mg by mouth 2 (two) times daily.    [provider]  acetic acid-hydrocortisone (VOSOL-HC) OTIC solution Place 4 drops into both ears See admin instructions. Chloramp/sulfu/ampho/ampho/B/H drop 100-100-5 mg Instill 4 drops into both ears the first 5 days of the month, then as directed/as needed for itching    [provider]  albuterol (VENTOLIN HFA) 108 (90 Base) MCG/ACT inhaler Inhale 2 puffs into the lungs every 6 (six) hours as needed for wheezing or shortness of breath. 03/01/21   Kozlow, [name], MD  allopurinol (ZYLOPRIM) 100 MG tablet TAKE 1 TABLET BY MOUTH  DAILY Patient taking differently: Take 100 mg by mouth daily with supper. 02/15/22   Lynnea Ferrier  T, MD  Alpha-D-Galactosidase (BEANO PO) Take 2-3 tablets by mouth 3 (three) times daily as needed (Gas).    [provider]  Alpha-Lipoic Acid 600 MG TABS Take 600 mg by mouth daily.    [provider]  ammonium lactate (AMLACTIN DAILY) 12 % lotion Apply 1 Application topically as needed for dry skin. 03/07/22   Candelaria Stagers, DPM  atorvastatin (LIPITOR) 80 MG tablet TAKE 1 TABLET BY MOUTH DAILY Patient taking differently: Take 80 mg by mouth daily with supper. 02/15/22   [name], MD  azelastine (ASTELIN) 0.1 % nasal spray Place 2 sprays into both nostrils daily. Use in each nostril as directed Patient taking differently: Place 2 sprays into both nostrils at bedtime as needed for allergies or rhinitis. Use in each nostril as directed 06/13/21   Kozlow, [name], MD  benzonatate (TESSALON) 200 MG capsule TAKE 1 CAPSULE BY MOUTH THREE TIMES DAILY AS NEEDED FOR COUGH Patient taking differently: Take 200 mg by mouth 2 (two) times daily as needed for cough. 07/13/21   [name], MD  budesonide (PULMICORT) 0.5 MG/2ML nebulizer solution Take 2 mLs (0.5 mg total) by nebulization 2 (two) times daily. 07/20/22   Martina Sinner, MD  Calcium Citrate (CITRACAL PO) Take 1 tablet by mouth daily. With magnesium    [provider]  cetirizine (ZYRTEC) 10 MG tablet Take 1 tablet (10 mg total) by mouth 2 (two) times daily as needed for allergies (Can take a n extra dose during flare ups.). Patient taking differently: Take 10 mg by mouth at bedtime. 06/13/21   Kozlow, [name], MD  Cholecalciferol (VITAMIN D3) 25 MCG (1000 UT) capsule Take 1,000 Units by mouth at bedtime.    [provider]  clopidogrel (PLAVIX) 75 MG tablet Take 1 tablet (75 mg total) by mouth daily. 05/11/22   Jodelle Red, MD  cyproheptadine (PERIACTIN) 4 MG tablet Take 1 tablet (4 mg total)  by mouth 3 (three) times daily as needed for allergies. Patient taking differently: Take 12 mg by mouth at bedtime. 08/10/22   Kozlow, [name], MD  denosumab (PROLIA) 60 MG/ML SOSY injection Inject 60 mg into the skin every 6 (six) months.    [provider]  empagliflozin (JARDIANCE) 10 MG TABS tablet Take 1 tablet (10 mg total) by mouth daily before breakfast. 04/11/22   [name], MD  EPINEPHrine 0.3 mg/0.3 mL IJ SOAJ injection USE AS DIRECTED BY YOUR PHYSICIAN INTRAMUSCULARLY AS NEEDED FOR ANAPHYLAXIS Patient taking differently: Inject 0.3 mg into the muscle as needed for anaphylaxis. 01/26/22   Kozlow, [name], MD  famotidine (PEPCID) 20 MG tablet Take 1 tablet (20 mg total) by mouth daily. TAKE 1 TABLET BY MOUTH EACH NIGHT AT BEDTIME Strength: 20 mg 08/07/22   Kozlow, [name], MD  fluticasone (FLONASE) 50 MCG/ACT nasal spray USE 2 SPRAYS IN EACH NOSTRIL DAILY Patient taking differently: Place 2 sprays into both nostrils daily as needed for allergies. 09/02/20   Martina Sinner, MD  folic acid (FOLVITE) 1 MG tablet Take 1 tablet (1 mg total) by mouth daily. 02/16/22   [name], MD  furosemide (LASIX) 40 MG tablet DAILY AS NEEDED FOR WEIGHT GAIN OF 2 POUNDS OVERNIGHT OR 5 POUNDS IN ONE WEEK 07/12/22   Alver Sorrow, NP  gabapentin (NEURONTIN) 300 MG capsule Take 1 capsule (300 mg total) by mouth at bedtime. Patient taking differently: Take 300 mg by mouth at bedtime. Can take up  to three time a say as needed 05/04/22   Lewie Chamber, MD  GOODSENSE ARTHRITIS PAIN 1 % GEL Apply 1 Application topically 4 times daily as needed for pain. Patient taking differently: Diclofenac 05/10/22   [name], MD  guaiFENesin (MUCINEX) 600 MG 12 hr tablet Take 1 tablet (600 mg total) by mouth 2 (two) times daily as needed for cough or to loosen phlegm. Patient taking differently: Take 600 mg by mouth 2 (two) times daily. 05/09/20   [name], MD  HIZENTRA 10 GM/50ML SOLN  Inject 65 mLs into the skin once a week. 13 g Infusion 08/01/22   [provider]  ipratropium-albuterol (DUONEB) 0.5-2.5 (3) MG/3ML SOLN Take 3 mLs by nebulization every 6 (six) hours as needed. 11/14/21   Martina Sinner, MD  isosorbide-hydrALAZINE (BIDIL) 20-37.5 MG tablet Take 1 tablet by mouth 2 (two) times daily. 07/12/22   Alver Sorrow, NP  Lactase (LACTOSE FAST ACTING RELIEF PO) Take 3 tablets by mouth 3 (three) times daily before meals. For dairy food or drink    [provider]  lidocaine-prilocaine (EMLA) cream Apply 1 application  topically as needed (pain). 04/18/21   [provider]  magnesium oxide (MAG-OX) 400 (240 Mg) MG tablet Take 400 mg by mouth at bedtime.    [provider]  Menthol, Topical Analgesic, (BIOFREEZE) 4 % GEL Apply 1 application  topically daily.    [provider]  mirtazapine (REMERON SOL-TAB) 30 MG disintegrating tablet Take 1 tablet (30 mg total) by mouth at bedtime as needed (sleep). Patient taking differently: Take 30 mg by mouth at bedtime. 11/13/21   [name], MD  montelukast (SINGULAIR) 10 MG tablet Take 1 tablet (10 mg total) by mouth daily after supper. 07/30/22   [name], MD  mupirocin ointment (BACTROBAN) 2 % Apply 1 Application topically at bedtime.    [provider]  neomycin-polymyxin-hydrocortisone (CORTISPORIN) 3.5-10000-1 OTIC suspension Place 4 drops into both ears daily as needed (irritation in ears).    [provider]  Nystatin (GERHARDT'S BUTT CREAM) CREA Apply topically 3 times a day as needed for itching or irritation 12/30/20   [name], MD  nystatin (MYCOSTATIN) 100000 UNIT/ML suspension Use as directed 15 mLs in the mouth or throat at bedtime as needed (if has yeast in mouth).    [provider]  nystatin-triamcinolone ointment (MYCOLOG) Apply 1 Application topically daily as needed (Vaginal irritation).    [provider]   potassium chloride (KLOR-CON M) 10 MEQ tablet DAILY AS NEEDED WHEN TAKING LASIX 07/12/22   Alver Sorrow, NP  predniSONE (DELTASONE) 1 MG tablet Take 2 tablets (2 mg total) by mouth daily with breakfast. Take along with 5 mg tablet daily. Patient taking differently: Take 2 mg by mouth See admin instructions. Take along with 5 mg for a total of 7 mg in the morning 07/30/22   Rice, [name], MD  predniSONE (DELTASONE) 5 MG tablet Take 1 tablet (5 mg total) by mouth daily with breakfast. Patient taking differently: Take 5 mg by mouth See admin instructions. Take with (2) 1 mg for a total of 7 mg in the morning 07/30/22   Rice, [name], MD  Prenatal Vit-Fe Fumarate-FA (PRENATAL VITAMINS PO) Take 1 tablet by mouth daily.    [provider]  Probiotic Product (ALIGN) 4 MG CAPS Take 4 mg by mouth daily.    [provider]  Propylene Glycol (SYSTANE COMPLETE) 0.6 %  SOLN Place 1 drop into both eyes 2 (two) times daily. 03/01/21   Kozlow, [name], MD  sacubitril-valsartan (ENTRESTO) 24-26 MG Take 1 tablet by mouth 2 (two) times daily. 08/13/22   Jodelle Red, MD  Tiotropium Bromide-Olodaterol (STIOLTO RESPIMAT) 2.5-2.5 MCG/ACT AERS Inhale 2 puffs into the lungs daily. Patient taking differently: Inhale 2 puffs into the lungs at bedtime. 07/20/22   Martina Sinner, MD  traMADol (ULTRAM) 50 MG tablet Take 1 tablet (50 mg total) by mouth at bedtime. 08/09/22   [name], MD  Triamcinolone Acetonide (TRIAMCINOLONE 0.1 % CREAM : EUCERIN) CREA Apply 1 Application topically 2 (two) times daily. Patient taking differently: Apply 1 Application topically daily as needed for rash or irritation. 05/21/22   [name], MD  triamcinolone ointment (KENALOG) 0.5 % Apply 1 Application topically 2 (two) times daily. 06/14/22   Jaci Standard, MD  XARELTO 20 MG TABS tablet TAKE 1 TABLET BY MOUTH DAILY WITH SUPPER Patient taking differently: Take 20 mg by mouth daily with  supper. 02/15/22   [name], MD    Physical Exam: Vitals:   08/31/22 1315 08/31/22 1330 08/31/22 1430 08/31/22 1451  BP: (!) 99/48 (!) 119/52  (!) 127/56  Pulse: 78 82  84  Resp: 14 17  17   Temp:   97.7 F (36.5 C) 99.9 F (37.7 C)  TempSrc:   Temporal Oral  SpO2: 96% 99%  97%  Weight:    58.6 kg  Height:    5' (1.524 m)   General:  Appears calm and comfortable, ill appearing Eyes:  EOMI, normal lids, iris ENT:  grossly normal hearing, lips & tongue, mmm Neck:  no LAD, masses or thyromegaly Cardiovascular:  RRR, no m/r/g. No LE edema.  Respiratory:   CTA bilaterally with no wheezes/rales/rhonchi.  Normal respiratory effort. Abdomen:  soft, NT, ND Skin:  no rash or induration seen on limited exam; steri-strips in place over L upper chest incision without apparent surrounding erythema or drainage Musculoskeletal:  grossly normal tone BUE/BLE, good ROM, no bony abnormality Psychiatric:  blunted mood and affect, speech fluent and appropriate, AOx3 Neurologic:  CN 2-12 grossly intact, moves all extremities in coordinated fashion   Radiological Exams on Admission: Independently reviewed - see discussion in A/P where applicable  DG Chest Port 1 View  Result Date: 08/31/2022 CLINICAL DATA:  Provided history: Questionable sepsis-evaluate for abnormality. EXAM: PORTABLE CHEST 1 VIEW COMPARISON:  Prior chest radiograph 08/27/2022 and earlier. FINDINGS: Left chest single-lead implantable cardiac device. Cardiomegaly. A nipple shadow projects over the left lung base. Small linear/bandlike opacities within the left lung base. Mild chronic elevation of the right hemidiaphragm. No appreciable airspace consolidation on the right. No evidence of pleural effusion or pneumothorax. No acute osseous abnormality identified. Incompletely imaged ACDF hardware. Metallic cerclage wires at the level of the cervicothoracic junction. Prior right shoulder arthroplasty. IMPRESSION: 1. Small  linear/bandlike opacities within the left lung base. The appearance favors atelectasis. However, pneumonia is difficult to definitively exclude. 2. Cardiomegaly. Electronically Signed   By: Jackey Loge D.O.   On: 08/31/2022 12:18    EKG: Independently reviewed.  NSR with rate 76; nonspecific ST changes with no evidence of acute ischemia   Labs on Admission: I have personally reviewed the available labs and imaging studies at the time of the admission.  Pertinent labs:    Glucose 147 BUN 36/Creatinine 2.58/GFR 19; 33/1.45/38 on 7/1 Albumin 2.7 WBC 13.2 Hgb 10.1 Platelets 132 INR 1.5  COVID/flu/RSV negative Blood cultures pending   Assessment and Plan: Principal Problem:   Lethargy Active Problems:   Coronary artery calcification seen on CAT scan   Chronic systolic CHF (congestive heart failure) (HCC)   Hyperlipidemia   Severe persistent asthma   Chronic kidney disease, stage 3b (HCC)   History of pulmonary embolus (PE)   DMII (diabetes mellitus, type 2) (HCC)   Acute kidney injury superimposed on chronic kidney disease (HCC)   Hypotension   DNR (do not resuscitate)    Lethargy due to hypotension Patient with recent AICD placement Since then, poor PO intake and increasing lethargy No frank infectious symptoms, borderline fever Marked hypotension with EMS, now resolved with IVF Possible infiltrate on CXR but clinically doubt infectious etiology currently so will hold further abx, sepsis unlikely That said, she is on chronic daily prednisone and at risk for infection so she needs to be closely monitored  Suspect that this was related to hypovolemia and is now improved Will check procalcitonin  AKI on stage 3b CKD Likely related to poor PO intake and ongoing use of Entresto, Jardiance Gently hydrate and follow  Chronic systolic CHF Refractory EF <35% despite maximum GDMT so AICD was placed earlier this week Continue Bidil Hold Entresto in the setting of AKI  CAD S/p  LAD DES in 12/2020 Continue Plavix  Bronchiectasis/Asthma Continue albuterol, Mucinex, Duoneb, Stiolto  HLD Continue atorvastatin  Gout Continue allopurinol  DVT/PE Continue Xarelto  DM Will check A1c hold Jardiance Cover with sensitive-scale SSI  Continue gabapentin  Immune deficiency On Hizentra and daily prednisone At increased risk for immunocompromised infections  Mood d/o Continue mirtazepine  DNR She previously revoked her DNR but on discussion today wants to resume Daughter was present for discussion    Advance Care Planning:   Code Status: DNR   Consults: Cardiology/EP; PT/OT; TOC team  DVT Prophylaxis: Eliquis  Family Communication: Daughter Writer) was present throughout evaluation  Severity of Illness: The appropriate patient status for this patient is INPATIENT. Inpatient status is judged to be reasonable and necessary in order to provide the required intensity of service to ensure the patient's safety. The patient's presenting symptoms, physical exam findings, and initial radiographic and laboratory data in the context of their chronic comorbidities is felt to place them at high risk for further clinical deterioration. Furthermore, it is not anticipated that the patient will be medically stable for discharge from the hospital within 2 midnights of admission.   * I certify that at the point of admission it is my clinical judgment that the patient will require inpatient hospital care spanning beyond 2 midnights from the point of admission due to high intensity of service, high risk for further deterioration and high frequency of surveillance required.*  Author: Jonah Blue, MD 08/31/2022 3:56 PM  For on call review [email or web address].

## 2022-08-31 NOTE — Progress Notes (Signed)
Pharmacy Antibiotic Note  Ebony Scott is a 72 y.o. female admitted on 08/31/2022 presenting with AMS, concern for sepsis.  Pharmacy has been consulted for vancomycin and cefepime dosing.  Plan: Vancomycin 1250 mg IV x 1, then variable dosing due to unstable renal function Cefepime 2g IV every 24h Monitor renal function, Cx and clinical progression to narrow Vancomycin levels as needed   Height: 5' (152.4 cm) Weight: 59 kg (130 lb 1.1 oz) IBW/kg (Calculated) : 45.5  Temp (24hrs), Avg:99.1 F (37.3 C), Min:99.1 F (37.3 C), Max:99.1 F (37.3 C)  Recent Labs  Lab 08/31/22 1149 08/31/22 1224  WBC 13.2*  --   CREATININE 2.58*  --   LATICACIDVEN  --  1.7    Estimated Creatinine Clearance: 15.4 mL/min (A) (by C-G formula based on SCr of 2.58 mg/dL (H)).    Allergies  Allergen Reactions   Baclofen Anaphylaxis and Other (See Comments)    Altered mental status requiring a 3-day hospital stay- patient became unresponsive   "Put me in a coma for five days", Altered mental status requiring a 3-day hospital stay- patient became unresponsive   Dust Mite Extract Shortness Of Breath and Other (See Comments)    "sneezing" (02/20/2012) too   Molds & Smuts Shortness Of Breath   Morphine And Codeine Hives and Itching   Other Shortness Of Breath and Other (See Comments)    Grass and weeds "sneezing; filled sinuses" (02/20/2012)   Penicillins Rash and Other (See Comments)    "welts" (02/20/2012)  Tolerated Ancef 02/16/20   Rofecoxib Swelling and Other (See Comments)    Vioxx- feet swelling   Shellfish Allergy Anaphylaxis, Shortness Of Breath, Itching and Rash   Shrimp Extract Anaphylaxis and Other (See Comments)    ALL SHELLFISH   Tetracycline Hcl Nausea And Vomiting   Tetracycline Hcl Other (See Comments)    GI Intolerance   Xolair [Omalizumab] Other (See Comments)    Caused Blood clot   Zoledronic Acid Other (See Comments)    Reclast- Fever, Put in hospital, dr said it was a reaction  from a reaction    Fever, inability to walk, Put in hospital  Fever, Put in hospital, dr said it was a reaction from a reaction , Other reaction(s): Unknown   Dilaudid [Hydromorphone Hcl] Itching   Hydrocodone-Acetaminophen Itching   Levofloxacin Other (See Comments)    GI upset  Other Reaction(s): GI Intolerance, Other (See Comments)  REACTION: GI upset, GI upset   Oxycodone Hcl Nausea And Vomiting   Oxycodone Hcl Other (See Comments)    GI Intolerance   Paroxetine Nausea And Vomiting and Other (See Comments)    GI Intolerance also   Celecoxib Swelling and Other (See Comments)    Feet swelling   Diltiazem Hcl Swelling   Lactose Other (See Comments)    Bloating and gas   Lactose Intolerance (Gi) Other (See Comments)    Bloating and gas   Oxycodone-Acetaminophen Itching   Rituximab Other (See Comments)    Anemia    Tree Extract Other (See Comments)    "tested and told I was allergic to it; never experienced a reaction to it" (02/20/2012)   Gamunex [Immune Globulin] Itching and Rash    Tolerates hizentra    Penicillin G Procaine Rash and Other (See Comments)    Welts, also    Daylene Posey, PharmD, [facility] Clinical Pharmacist ED Pharmacist Phone # [phone] 08/31/2022 1:00 PM

## 2022-08-31 NOTE — ED Provider Notes (Signed)
[place] EMERGENCY DEPARTMENT AT [facility] Provider Note   CSN: [id number] Arrival date & time: 08/31/22  1136     History  Chief Complaint  Patient presents with   Fatigue    Ebony Scott is a 73 y.o. female.  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  Patient states that she had ICD placement on Monday.  She states "I guess since then I have not been eating or drinking enough."  Per EMS report, patient had ICD placement on Monday.  She has had home health and family helping her since then but has had decreased p.o. intake.  They state that she has been confused at home over the past 2 days and reported low-grade fever.  She has also had cough with yellow sputum.  On their arrival patient initial blood pressure was 64/30, she received 200 and mils of normal saline with improvement to 86/41.  Patient does endorse shortness of breath and cough but states that this is chronic for her.  She is denying any chest pain, abdominal pain, dysuria.  HPI     Home Medications Prior to Admission medications   Medication Sig Start Date End Date Taking? Authorizing Provider  acetaminophen (TYLENOL) 650 MG CR tablet Take 1,300 mg by mouth 2 (two) times daily.    [provider]  acetic acid-hydrocortisone (VOSOL-HC) OTIC solution Place 4 drops into both ears See admin instructions. Chloramp/sulfu/ampho/ampho/B/H drop 100-100-5 mg Instill 4 drops into both ears the first 5 days of the month, then as directed/as needed for itching    [provider]  albuterol (VENTOLIN HFA) 108 (90 Base) MCG/ACT inhaler Inhale 2 puffs into the lungs every 6 (six) hours as needed for wheezing or shortness of breath. 03/01/21   Kozlow, [name], MD  allopurinol (ZYLOPRIM) 100 MG tablet TAKE 1 TABLET BY MOUTH DAILY Patient taking  differently: Take 100 mg by mouth daily with supper. 02/15/22   [name], MD  Alpha-D-Galactosidase (BEANO PO) Take 2-3 tablets by mouth 3 (three) times daily as needed (Gas).    [provider]  Alpha-Lipoic Acid 600 MG TABS Take 600 mg by mouth daily.    [provider]  ammonium lactate (AMLACTIN DAILY) 12 % lotion Apply 1 Application topically as needed for dry skin. 03/07/22   Candelaria Stagers, DPM  atorvastatin (LIPITOR) 80 MG tablet TAKE 1 TABLET BY MOUTH DAILY Patient taking differently: Take 80 mg by mouth daily with supper. 02/15/22   [name], MD  azelastine (ASTELIN) 0.1 % nasal spray Place 2 sprays into both nostrils daily. Use in each nostril as directed Patient taking differently: Place 2 sprays into both nostrils at bedtime as needed for allergies or rhinitis. Use in each nostril as directed 06/13/21   Kozlow, [name], MD  benzonatate (TESSALON) 200 MG capsule TAKE 1 CAPSULE BY MOUTH THREE TIMES DAILY AS NEEDED FOR COUGH Patient taking differently: Take 200 mg by mouth 2 (two) times daily as needed for cough. 07/13/21   [name], MD  budesonide (PULMICORT) 0.5 MG/2ML nebulizer solution Take 2 mLs (0.5 mg total) by nebulization 2 (two) times daily. 07/20/22   Martina Sinner, MD  Calcium Citrate (CITRACAL PO) Take 1 tablet by mouth daily. With magnesium    [provider]  cetirizine (ZYRTEC) 10 MG tablet Take 1 tablet (10  mg total) by mouth 2 (two) times daily as needed for allergies (Can take a n extra dose during flare ups.). Patient taking differently: Take 10 mg by mouth at bedtime. 06/13/21   Kozlow, [name], MD  Cholecalciferol (VITAMIN D3) 25 MCG (1000 UT) capsule Take 1,000 Units by mouth at bedtime.    [provider]  clopidogrel (PLAVIX) 75 MG tablet Take 1 tablet (75 mg total) by mouth daily. 05/11/22   Jodelle Red, MD  cyproheptadine (PERIACTIN) 4 MG tablet Take 1 tablet (4 mg total) by mouth 3 (three)  times daily as needed for allergies. Patient taking differently: Take 12 mg by mouth at bedtime. 08/10/22   Kozlow, [name], MD  denosumab (PROLIA) 60 MG/ML SOSY injection Inject 60 mg into the skin every 6 (six) months.    [provider]  empagliflozin (JARDIANCE) 10 MG TABS tablet Take 1 tablet (10 mg total) by mouth daily before breakfast. 04/11/22   [name], MD  EPINEPHrine 0.3 mg/0.3 mL IJ SOAJ injection USE AS DIRECTED BY YOUR PHYSICIAN INTRAMUSCULARLY AS NEEDED FOR ANAPHYLAXIS Patient taking differently: Inject 0.3 mg into the muscle as needed for anaphylaxis. 01/26/22   Kozlow, [name], MD  famotidine (PEPCID) 20 MG tablet Take 1 tablet (20 mg total) by mouth daily. TAKE 1 TABLET BY MOUTH EACH NIGHT AT BEDTIME Strength: 20 mg 08/07/22   Kozlow, [name], MD  fluticasone (FLONASE) 50 MCG/ACT nasal spray USE 2 SPRAYS IN EACH NOSTRIL DAILY Patient taking differently: Place 2 sprays into both nostrils daily as needed for allergies. 09/02/20   Martina Sinner, MD  folic acid (FOLVITE) 1 MG tablet Take 1 tablet (1 mg total) by mouth daily. 02/16/22   [name], MD  furosemide (LASIX) 40 MG tablet DAILY AS NEEDED FOR WEIGHT GAIN OF 2 POUNDS OVERNIGHT OR 5 POUNDS IN ONE WEEK 07/12/22   Alver Sorrow, NP  gabapentin (NEURONTIN) 300 MG capsule Take 1 capsule (300 mg total) by mouth at bedtime. Patient taking differently: Take 300 mg by mouth at bedtime. Can take up to three time a say as needed 05/04/22   Lewie Chamber, MD  GOODSENSE ARTHRITIS PAIN 1 % GEL Apply 1 Application topically 4 times daily as needed for pain. Patient taking differently: Diclofenac 05/10/22   [name], MD  guaiFENesin (MUCINEX) 600 MG 12 hr tablet Take 1 tablet (600 mg total) by mouth 2 (two) times daily as needed for cough or to loosen phlegm. Patient taking differently: Take 600 mg by mouth 2 (two) times daily. 05/09/20   [name], MD  HIZENTRA 10 GM/50ML SOLN Inject 65 mLs into the  skin once a week. 13 g Infusion 08/01/22   [provider]  ipratropium-albuterol (DUONEB) 0.5-2.5 (3) MG/3ML SOLN Take 3 mLs by nebulization every 6 (six) hours as needed. 11/14/21   Martina Sinner, MD  isosorbide-hydrALAZINE (BIDIL) 20-37.5 MG tablet Take 1 tablet by mouth 2 (two) times daily. 07/12/22   Alver Sorrow, NP  Lactase (LACTOSE FAST ACTING RELIEF PO) Take 3 tablets by mouth 3 (three) times daily before meals. For dairy food or drink    [provider]  lidocaine-prilocaine (EMLA) cream Apply 1 application  topically as needed (pain). 04/18/21   [provider]  magnesium oxide (MAG-OX) 400 (240 Mg) MG tablet Take 400 mg by mouth at bedtime.    [provider]  Menthol, Topical Analgesic, (BIOFREEZE) 4 % GEL Apply 1 application  topically  daily.    [provider]  mirtazapine (REMERON SOL-TAB) 30 MG disintegrating tablet Take 1 tablet (30 mg total) by mouth at bedtime as needed (sleep). Patient taking differently: Take 30 mg by mouth at bedtime. 11/13/21   [name], MD  montelukast (SINGULAIR) 10 MG tablet Take 1 tablet (10 mg total) by mouth daily after supper. 07/30/22   [name], MD  mupirocin ointment (BACTROBAN) 2 % Apply 1 Application topically at bedtime.    [provider]  neomycin-polymyxin-hydrocortisone (CORTISPORIN) 3.5-10000-1 OTIC suspension Place 4 drops into both ears daily as needed (irritation in ears).    [provider]  Nystatin (GERHARDT'S BUTT CREAM) CREA Apply topically 3 times a day as needed for itching or irritation 12/30/20   [name], MD  nystatin (MYCOSTATIN) 100000 UNIT/ML suspension Use as directed 15 mLs in the mouth or throat at bedtime as needed (if has yeast in mouth).    [provider]  nystatin-triamcinolone ointment (MYCOLOG) Apply 1 Application topically daily as needed (Vaginal irritation).    [provider]  potassium chloride  (KLOR-CON M) 10 MEQ tablet DAILY AS NEEDED WHEN TAKING LASIX 07/12/22   Alver Sorrow, NP  predniSONE (DELTASONE) 1 MG tablet Take 2 tablets (2 mg total) by mouth daily with breakfast. Take along with 5 mg tablet daily. Patient taking differently: Take 2 mg by mouth See admin instructions. Take along with 5 mg for a total of 7 mg in the morning 07/30/22   Rice, [name], MD  predniSONE (DELTASONE) 5 MG tablet Take 1 tablet (5 mg total) by mouth daily with breakfast. Patient taking differently: Take 5 mg by mouth See admin instructions. Take with (2) 1 mg for a total of 7 mg in the morning 07/30/22   Rice, [name], MD  Prenatal Vit-Fe Fumarate-FA (PRENATAL VITAMINS PO) Take 1 tablet by mouth daily.    [provider]  Probiotic Product (ALIGN) 4 MG CAPS Take 4 mg by mouth daily.    [provider]  Propylene Glycol (SYSTANE COMPLETE) 0.6 % SOLN Place 1 drop into both eyes 2 (two) times daily. 03/01/21   Kozlow, [name], MD  sacubitril-valsartan (ENTRESTO) 24-26 MG Take 1 tablet by mouth 2 (two) times daily. 08/13/22   Jodelle Red, MD  Tiotropium Bromide-Olodaterol (STIOLTO RESPIMAT) 2.5-2.5 MCG/ACT AERS Inhale 2 puffs into the lungs daily. Patient taking differently: Inhale 2 puffs into the lungs at bedtime. 07/20/22   Martina Sinner, MD  traMADol (ULTRAM) 50 MG tablet Take 1 tablet (50 mg total) by mouth at bedtime. 08/09/22   [name], MD  Triamcinolone Acetonide (TRIAMCINOLONE 0.1 % CREAM : EUCERIN) CREA Apply 1 Application topically 2 (two) times daily. Patient taking differently: Apply 1 Application topically daily as needed for rash or irritation. 05/21/22   [name], MD  triamcinolone ointment (KENALOG) 0.5 % Apply 1 Application topically 2 (two) times daily. 06/14/22   Jaci Standard, MD  XARELTO 20 MG TABS tablet TAKE 1 TABLET BY MOUTH DAILY WITH SUPPER Patient taking differently: Take 20 mg by mouth daily with supper. 02/15/22    [name], MD      Allergies    Baclofen, Dust mite extract, Molds & smuts, Morphine and codeine, Other, Penicillins, Rofecoxib, Shellfish allergy, Shrimp extract, Tetracycline hcl, Tetracycline hcl, Xolair [omalizumab], Zoledronic acid, Dilaudid [hydromorphone hcl], Hydrocodone-acetaminophen, Levofloxacin, Oxycodone hcl, Oxycodone hcl, Paroxetine, Celecoxib, Diltiazem hcl, Lactose, Lactose intolerance (gi), Oxycodone-acetaminophen, Rituximab, Tree  extract, Gamunex [immune globulin], and Penicillin g procaine    Review of Systems   Review of Systems  Constitutional:  Positive for appetite change, fatigue and fever.  Respiratory:  Positive for cough and shortness of breath.     Physical Exam Updated Vital Signs BP (!) 119/52   Pulse 82   Temp 99.1 F (37.3 C) (Rectal)   Resp 17   Ht 5' (1.524 m)   Wt 59 kg   SpO2 99%   BMI 25.40 kg/m  Physical Exam Vitals and nursing note reviewed.  Constitutional:      Appearance: She is ill-appearing.  HENT:     Head: Normocephalic.     Mouth/Throat:     Mouth: Mucous membranes are dry.     Pharynx: Oropharynx is clear.  Eyes:     General: No scleral icterus.    Extraocular Movements: Extraocular movements intact.  Cardiovascular:     Rate and Rhythm: Normal rate and regular rhythm.     Pulses: Normal pulses.     Heart sounds: No murmur heard. Pulmonary:     Effort: Pulmonary effort is normal. No respiratory distress.     Breath sounds: Examination of the right-lower field reveals rhonchi. Examination of the left-lower field reveals rhonchi. Rhonchi present.     Comments: Productive cough  Abdominal:     General: Bowel sounds are normal. There is no distension.     Palpations: Abdomen is soft.     Tenderness: There is no abdominal tenderness.  Skin:    General: Skin is warm and dry.     Capillary Refill: Capillary refill takes less than 2 seconds.  Neurological:     General: No focal deficit present.     Mental Status:  She is alert and oriented to person, place, and time.     Motor: Weakness present.  Psychiatric:        Mood and Affect: Mood normal.        Behavior: Behavior normal.     ED Results / Procedures / Treatments   Labs (all labs ordered are listed, but only abnormal results are displayed) Labs Reviewed  COMPREHENSIVE METABOLIC PANEL - Abnormal; Notable for the following components:      Result Value   CO2 21 (*)    Glucose, Bld 147 (*)    BUN 36 (*)    Creatinine, Ser 2.58 (*)    Calcium 8.8 (*)    Albumin 2.7 (*)    GFR, Estimated 19 (*)    All other components within normal limits  CBC WITH DIFFERENTIAL/PLATELET - Abnormal; Notable for the following components:   WBC 13.2 (*)    RBC 2.83 (*)    Hemoglobin 10.1 (*)    HCT 31.2 (*)    MCV 110.2 (*)    MCH 35.7 (*)    Platelets 132 (*)    Neutro Abs 9.9 (*)    Monocytes Absolute 2.4 (*)    All other components within normal limits  PROTIME-INR - Abnormal; Notable for the following components:   Prothrombin Time 18.7 (*)    INR 1.5 (*)    All other components within normal limits  APTT - Abnormal; Notable for the following components:   aPTT 37 (*)    All other components within normal limits  RESP PANEL BY RT-PCR (RSV, FLU A&B, COVID)  RVPGX2  CULTURE, BLOOD (ROUTINE X 2)  CULTURE, BLOOD (ROUTINE X 2)  URINALYSIS, W/ REFLEX TO CULTURE (INFECTION SUSPECTED)  I-STAT  CG4 LACTIC ACID, ED    EKG None  Radiology DG Chest Port 1 View  Result Date: 08/31/2022 CLINICAL DATA:  Provided history: Questionable sepsis-evaluate for abnormality. EXAM: PORTABLE CHEST 1 VIEW COMPARISON:  Prior chest radiograph 08/27/2022 and earlier. FINDINGS: Left chest single-lead implantable cardiac device. Cardiomegaly. A nipple shadow projects over the left lung base. Small linear/bandlike opacities within the left lung base. Mild chronic elevation of the right hemidiaphragm. No appreciable airspace consolidation on the right. No evidence of  pleural effusion or pneumothorax. No acute osseous abnormality identified. Incompletely imaged ACDF hardware. Metallic cerclage wires at the level of the cervicothoracic junction. Prior right shoulder arthroplasty. IMPRESSION: 1. Small linear/bandlike opacities within the left lung base. The appearance favors atelectasis. However, pneumonia is difficult to definitively exclude. 2. Cardiomegaly. Electronically Signed   By: Jackey Loge D.O.   On: 08/31/2022 12:18    Procedures .Critical Care  Performed by: Cristopher Peru, PA-C Authorized by: Cristopher Peru, PA-C   Critical care provider statement:    Critical care time (minutes):  50   Critical care time was exclusive of:  Separately billable procedures and treating other patients   Critical care was necessary to treat or prevent imminent or life-threatening deterioration of the following conditions:  Sepsis and shock   Critical care was time spent personally by me on the following activities:  Development of treatment plan with patient or surrogate, discussions with consultants, discussions with primary provider, evaluation of patient's response to treatment, examination of patient, interpretation of cardiac output measurements, obtaining history from patient or surrogate, review of old charts, re-evaluation of patient's condition, pulse oximetry, ordering and review of radiographic studies, ordering and review of laboratory studies and ordering and performing treatments and interventions   I assumed direction of critical care for this patient from another provider in my specialty: no     Care discussed with: admitting provider      Medications Ordered in ED Medications  vancomycin (VANCOREADY) IVPB 1250 mg/250 mL (1,250 mg Intravenous New Bag/Given 08/31/22 1332)  vancomycin variable dose per unstable renal function (pharmacist dosing) (has no administration in time range)  ceFEPIme (MAXIPIME) 2 g in sodium chloride 0.9 % 100 mL IVPB (0 g  Intravenous Stopped 08/31/22 1340)  sodium chloride 0.9 % bolus 500 mL (0 mLs Intravenous Stopped 08/31/22 1332)    ED Course/ Medical Decision Making/ A&P  Medical Decision Making Amount and/or Complexity of Data Reviewed Labs: ordered. Radiology: ordered. ECG/medicine tests: ordered.  Risk Prescription drug management. Decision regarding hospitalization.  Initial Impression and Ddx 72 year old female who presents to the emergency department with altered mental status Patient PMH that increases complexity of ED encounter: Ischemic cardiomyopathy, CKD stage III, DVT and PE, recent ICD placement  Interpretation of Diagnostics I independent reviewed and interpreted the labs as followed: Mild leukocytosis to 13.2, hemoglobin is stable, new thrombocytopenia to the 132.  Coags are mildly deranged but she is on Xarelto and INR is not significantly elevated.  CMP with AKI of creatinine 2.58.  Respiratory panel is negative.  Lactic is negative.  Blood cultures are pending.  Urine is pending.  - I independently visualized the following imaging with scope of interpretation limited to determining acute life threatening conditions related to emergency care: Chest x-ray, which revealed cannot exclude pneumonia  Patient Reassessment and Ultimate Disposition/Management 72 year old female who presents to the emergency department with report of confusion and low-grade fever at home.  On my initial evaluation she is tired appearing, blood  pressure is soft in the 90s.  She does have low-grade fever of 99 here but she feels hotter than this on physical exam. She has some lower lobe rhonchi and productive cough.  States that she always has cough but concern for pneumonia given that she is post procedure.  She is not having urinary symptoms.  Her surgical site appears to be clean and not infected.  I am starting a sepsis workup.  Will hold on fluids for now given her EF of 30%.  Will also get chest  x-ray.  Chest x-ray with possible pneumonia.  Will go ahead and start her on cefepime and Vanc.  Blood pressure continues to be soft with maps around 62-63 so we will give her 500 of IV fluid and reassess.  CMP with AKI.  CBC with mild leukocytosis.  Her coagulation studies are mildly deranged but she is on Xarelto.  Do not often see Xarelto causing coagulation derangement however her INR is only 1.5 and do not think that it is significant for an early DIC or some other coagulopathy at this time. Respiratory panel is negative  When I went to reassess the patient again the daughter is at bedside and states that she had an episode of bilious vomiting and is unsure if she aspirated or not.  Certainly this could be causing her to have a pneumonitis or aspiration pneumonia.  Getting IV antibiotics.  Lactic is negative which is reassuring.  Consulted and spoke with Dr. Ophelia Charter, hospitalist who will admit the patient for sepsis.  Patient is agreeable to plan.  Patient management required discussion with the following services or consulting groups:  Hospitalist Service  Complexity of Problems Addressed Acute illness or injury that poses threat of life of bodily function  Additional Data Reviewed and Analyzed Further history obtained from: Further history from spouse/family member, Past medical history and medications listed in the EMR, Prior ED visit notes, Recent discharge summary, Care Everywhere, and Prior labs/imaging results  Patient Encounter Risk Assessment Consideration of hospitalization  Final Clinical Impression(s) / ED Diagnoses Final diagnoses:  Sepsis with encephalopathy without septic shock, due to unspecified organism [facility])    Rx / DC Orders ED Discharge Orders     None         Cristopher Peru, PA-C 08/31/22 1359    Kommor, Wyn Forster, MD 08/31/22 1732

## 2022-09-01 DIAGNOSIS — D709 Neutropenia, unspecified: Secondary | ICD-10-CM

## 2022-09-01 DIAGNOSIS — R509 Fever, unspecified: Secondary | ICD-10-CM | POA: Diagnosis not present

## 2022-09-01 DIAGNOSIS — R5383 Other fatigue: Secondary | ICD-10-CM | POA: Diagnosis not present

## 2022-09-01 LAB — GLUCOSE, CAPILLARY
Glucose-Capillary: 92 mg/dL (ref 70–99)
Glucose-Capillary: 187 mg/dL — ABNORMAL HIGH (ref 70–99)
Glucose-Capillary: 103 mg/dL — ABNORMAL HIGH (ref 70–99)
Glucose-Capillary: 101 mg/dL — ABNORMAL HIGH (ref 70–99)

## 2022-09-01 LAB — BASIC METABOLIC PANEL
Anion gap: 10 (ref 5–15)
BUN: 36 mg/dL — ABNORMAL HIGH (ref 8–23)
CO2: 19 mmol/L — ABNORMAL LOW (ref 22–32)
Calcium: 8.7 mg/dL — ABNORMAL LOW (ref 8.9–10.3)
Chloride: 110 mmol/L (ref 98–111)
Creatinine, Ser: 1.82 mg/dL — ABNORMAL HIGH (ref 0.44–1.00)
GFR, Estimated: 29 mL/min — ABNORMAL LOW (ref >60)
Glucose, Bld: 96 mg/dL (ref 70–99)
Potassium: 3.9 mmol/L (ref 3.5–5.1)
Sodium: 139 mmol/L (ref 135–145)

## 2022-09-01 LAB — CBC
HCT: 30.9 % — ABNORMAL LOW (ref 36.0–46.0)
Hemoglobin: 9.9 g/dL — ABNORMAL LOW (ref 12.0–15.0)
MCH: 34 pg (ref 26.0–34.0)
MCHC: 32 g/dL (ref 30.0–36.0)
MCV: 106.2 fL — ABNORMAL HIGH (ref 80.0–100.0)
Platelets: 142 10*3/uL — ABNORMAL LOW (ref 150–400)
RBC: 2.91 MIL/uL — ABNORMAL LOW (ref 3.87–5.11)
RDW: 14.4 % (ref 11.5–15.5)
WBC: 10.7 10*3/uL — ABNORMAL HIGH (ref 4.0–10.5)
nRBC: 0 % (ref 0.0–0.2)

## 2022-09-01 LAB — EXPECTORATED SPUTUM ASSESSMENT W GRAM STAIN, RFLX TO RESP C

## 2022-09-01 LAB — MRSA NEXT GEN BY PCR, NASAL: MRSA by PCR Next Gen: NOT DETECTED

## 2022-09-01 LAB — STREP PNEUMONIAE URINARY ANTIGEN: Strep Pneumo Urinary Antigen: NEGATIVE

## 2022-09-01 MED ORDER — SODIUM CHLORIDE 0.9 % IV SOLN
2.0000 g | INTRAVENOUS | Status: DC
  Administered 2022-09-01 – 2022-09-03 (×3): 2 g via INTRAVENOUS
  Filled 2022-09-01 (×4): qty 20

## 2022-09-01 MED ORDER — DICLOFENAC SODIUM 1 % EX GEL
2.0000 g | Freq: Four times a day (QID) | CUTANEOUS | Status: DC | PRN
  Filled 2022-09-01: qty 100

## 2022-09-01 MED ORDER — AZITHROMYCIN 250 MG PO TABS
500.0000 mg | ORAL_TABLET | Freq: Every day | ORAL | Status: DC
  Administered 2022-09-01 – 2022-09-04 (×4): 500 mg via ORAL
  Filled 2022-09-01 (×4): qty 2

## 2022-09-01 MED ORDER — RIVAROXABAN 10 MG PO TABS
10.0000 mg | ORAL_TABLET | Freq: Every day | ORAL | Status: DC
  Administered 2022-09-01 – 2022-09-03 (×3): 10 mg via ORAL
  Filled 2022-09-01 (×3): qty 1

## 2022-09-01 NOTE — Progress Notes (Signed)
   Rounding Note    Patient Name: Ebony Scott Date of Encounter: 09/01/2022  [facility] Cardiologist: Jodelle Red, MD   Subjective   Febrile this morning.  No localizing source.  Vital Signs    Vitals:   08/31/22 2318 09/01/22 0510 09/01/22 0711 09/01/22 0755  BP: (!) 127/48 (!) 154/61  120/77  Pulse: 88 85  86  Resp: 16 18 18 18   Temp: 98.2 F (36.8 C) 98.4 F (36.9 C) 98.2 F (36.8 C) (!) 101.9 F (38.8 C)  TempSrc: Oral Oral Oral   SpO2: 93% 95%  93%  Weight:      Height:        Intake/Output Summary (Last 24 hours) at 09/01/2022 0828 Last data filed at 09/01/2022 0311 Gross per 24 hour  Intake 1773.84 ml  Output 600 ml  Net 1173.84 ml      08/31/2022    2:51 PM 08/31/2022   12:12 PM 08/27/2022    2:40 PM  Last 3 Weights  Weight (lbs) 129 lb 3.2 oz 130 lb 1.1 oz 130 lb  Weight (kg) 58.605 kg 59 kg 58.968 kg      Telemetry    Personally Reviewed  ECG    Personally Reviewed  Physical Exam   GEN: No acute distress.   Cardiac: RRR, no murmurs, rubs, or gallops. ICD pocket without obvious infection. Minimal swelling. Respiratory: Clear to auscultation bilaterally. Psych: Normal affect   Assessment & Plan    #Fever Unclear source. Workup ongoing. She is on chronic steroids. Blood cultures pending.   #ICD in situ Implanted 08/27/2022   #AKI Management per IM    Sheria Lang T. Lalla Brothers, MD, [facility], [facility] Cardiac Electrophysiology

## 2022-09-01 NOTE — Evaluation (Signed)
Physical Therapy Evaluation Patient Details Name: Ebony Scott MRN: [id number] DOB: [birth year] Today's Date: 09/01/2022  History of Present Illness  72 y.o. female admitted 7/12 with fever, presenting with fatigue and poor PO intake following AICD insertion Monday (7/8). Past medical history significant of DVT, remote breast cancer, bronchiectasis, chronic systolic CHF, fibromyalgia, GAVE, HLD, hypothyroidism, and RA.  Clinical Impression  Pt admitted with above complications. Previously mod I at home, lives with daughter who works evenings as an Charity fundraiser, present during evaluation and very supportive. Required up to min assist for transfer from bed today, min guard with short distance gait using RW for support, easily fatigued, moderately antalgic gait pattern (reports due for Lt TKA with Dr. Dion Saucier.) Anticipate functional ability will improve as medical issues resolve. We will continue to follow and work with Ebony Scott during admission. Will update recommendations as appropriate. Encouraged LE exercises between therapy sessions to reduce rate of muscle atrophy during this period of low mobility and dependence. Pt currently with functional limitations due to the deficits listed below (see PT Problem List). Pt will benefit from acute skilled PT to increase their independence and safety with mobility to allow discharge.           Assistance Recommended at Discharge Intermittent Supervision/Assistance  If plan is discharge home, recommend the following:  Can travel by private vehicle  A little help with walking and/or transfers;A little help with bathing/dressing/bathroom;Assistance with cooking/housework;Direct supervision/assist for medications management;Direct supervision/assist for financial management;Assist for transportation;Help with stairs or ramp for entrance        Equipment Recommendations None recommended by PT  Recommendations for Other Services       Functional Status Assessment  Patient has had a recent decline in their functional status and demonstrates the ability to make significant improvements in function in a reasonable and predictable amount of time.     Precautions / Restrictions Precautions Precautions: ICD/Pacemaker Precaution Comments: Reviewed; iplant was 7/8 Restrictions Weight Bearing Restrictions: No      Mobility  Bed Mobility Overal bed mobility: Needs Assistance Bed Mobility: Supine to Sit, Sit to Supine     Supine to sit: Min guard Sit to supine: Min guard   General bed mobility comments: Min guard for safety, requires extra time, use of rail to rise. VC for technique. Effortful.    Transfers Overall transfer level: Needs assistance Equipment used: Rolling walker (2 wheels) Transfers: Sit to/from Stand Sit to Stand: Min assist           General transfer comment: Min assist for boost and balance rising from edge of bed. Cues for hand placement. Minor instability upon rising. Stabilized with time and use for RW for support.    Ambulation/Gait Ambulation/Gait assistance: Min guard Gait Distance (Feet): 28 Feet Assistive device: Rolling walker (2 wheels) Gait Pattern/deviations: Step-through pattern, Decreased stride length, Decreased stance time - left, Antalgic, Drifts right/left Gait velocity: decr Gait velocity interpretation: <1.31 ft/sec, indicative of household ambulator   General Gait Details: Fair walker control however moderately antalgic with WB through LLE showing increased lateral sway. Close guard for safety. No overt buckling however suspect more unstable than baseline. SpO2 100% on RA. Reports very fatigued with short distance today.  Stairs            Wheelchair Mobility     Tilt Bed    Modified Rankin (Stroke Patients Only)       Balance Overall balance assessment: Needs assistance Sitting-balance support: No upper extremity supported, Feet  supported Sitting balance-Leahy Scale: Good      Standing balance support: Single extremity supported Standing balance-Leahy Scale: Poor                               Pertinent Vitals/Pain Pain Assessment Pain Assessment: Faces Faces Pain Scale: Hurts little more Pain Location: bil Feet - reports hx of neuropathy. Pain Descriptors / Indicators: Burning Pain Intervention(s): Monitored during session, Repositioned    Home Living Family/patient expects to be discharged to:: Private residence Living Arrangements: Children Available Help at Discharge: Family;Available PRN/intermittently (alone at night) Type of Home: House Home Access: Stairs to enter Entrance Stairs-Rails: Right;Left Entrance Stairs-Number of Steps: 6   Home Layout: Two level;Able to live on main level with bedroom/bathroom Home Equipment: Rollator (4 wheels)      Prior Function Prior Level of Function : Independent/Modified Independent             Mobility Comments: rollator in and out of house ADLs Comments: Able to perform ADLs. Daughter assist with cooking/cleaning but pt can do some.     Hand Dominance   Dominant Hand: Right    Extremity/Trunk Assessment   Upper Extremity Assessment Upper Extremity Assessment: Defer to OT evaluation    Lower Extremity Assessment Lower Extremity Assessment: Generalized weakness (Reports awaiting TKA on Lt)       Communication   Communication: No difficulties  Cognition Arousal/Alertness: Awake/alert Behavior During Therapy: WFL for tasks assessed/performed Overall Cognitive Status: Impaired/Different from baseline                                 General Comments: Daughter reports pt not at baseline, having memory issues. Requesting abnormal items to be brought to the hospital.        General Comments General comments (skin integrity, edema, etc.): Daughter present, very supportive.    Exercises General Exercises - Lower Extremity Ankle Circles/Pumps: AROM, Both, 10  reps, Supine Quad Sets: Strengthening, Both, 10 reps, Supine Gluteal Sets: Strengthening, Both, 10 reps, Supine   Assessment/Plan    PT Assessment Patient needs continued PT services  PT Problem List Decreased strength;Decreased activity tolerance;Decreased balance;Decreased mobility;Decreased cognition;Decreased knowledge of use of DME;Impaired sensation;Pain       PT Treatment Interventions DME instruction;Gait training;Stair training;Functional mobility training;Therapeutic activities;Therapeutic exercise;Balance training;Neuromuscular re-education;Patient/family education;Cognitive remediation    PT Goals (Current goals can be found in the Care Plan section)  Acute Rehab PT Goals Patient Stated Goal: Feel better PT Goal Formulation: With patient/family Time For Goal Achievement: 09/15/22 Potential to Achieve Goals: Good    Frequency Min 1X/week     Co-evaluation               AM-PAC PT "6 Clicks" Mobility  Outcome Measure Help needed turning from your back to your side while in a flat bed without using bedrails?: A Little Help needed moving from lying on your back to sitting on the side of a flat bed without using bedrails?: A Little Help needed moving to and from a bed to a chair (including a wheelchair)?: A Little Help needed standing up from a chair using your arms (e.g., wheelchair or bedside chair)?: A Little Help needed to walk in hospital room?: A Little Help needed climbing 3-5 steps with a railing? : A Lot 6 Click Score: 17    End of Session Equipment Utilized During Treatment: Gait  belt Activity Tolerance: Patient tolerated treatment well;Patient limited by fatigue Patient left: in bed;with call bell/phone within reach;with bed alarm set;with family/visitor present Nurse Communication: Mobility status PT Visit Diagnosis: Unsteadiness on feet (R26.81);Other abnormalities of gait and mobility (R26.89);Muscle weakness (generalized) (M62.81);Difficulty in  walking, not elsewhere classified (R26.2);Pain Pain - Right/Left: Left Pain - part of body: Knee    Time: 1610-9604 PT Time Calculation (min) (ACUTE ONLY): 25 min   Charges:   PT Evaluation $PT Eval Low Complexity: 1 Low PT Treatments $Therapeutic Activity: 8-22 mins PT General Charges $$ ACUTE PT VISIT: 1 Visit         Kathlyn Sacramento, PT, DPT [facility]  Rehabilitation Services Physical Therapist Office: [phone] Website: [email or web address]   Berton Mount 09/01/2022, 1:48 PM

## 2022-09-01 NOTE — Progress Notes (Signed)
PROGRESS NOTE    Ebony Scott  WUJ:[id number] DOB: [birth year] DOA: 08/31/2022 PCP: [name], MD     Brief Narrative:  Ebony Scott is a 72 y.o. female with medical history significant of DVT, remote breast cancer, bronchiectasis, chronic systolic CHF, fibromyalgia, GAVE, HLD, hypothyroidism, and RA presenting with fatigue and poor PO intake following AICD insertion Monday (7/8).   Since then, she has been lethargic with poor PO intake, "low grade fever" <100.4 at home.  She had 1 episode of bilious emesis a few days ago.  Chronic cough from bronchiectasis without overt change.  No frank urinary symptoms other than 1 episode of incontinence (has made very little urine).    New events last 24 hours / Subjective: Patient continues to feel lethargic.  Fever today 101.9.  She does admit to productive cough, and slightly worsened cough from her baseline chronic cough.  Overall feeling very fatigued.  Denies shortness of breath.  No urinary symptoms.  No diarrhea.  Assessment & Plan:   Principal Problem:   Fever Active Problems:   Coronary artery calcification seen on CAT scan   Chronic systolic CHF (congestive heart failure) (HCC)   Hyperlipidemia   Severe persistent asthma   Chronic kidney disease, stage 3b (HCC)   History of pulmonary embolus (PE)   DMII (diabetes mellitus, type 2) (HCC)   Acute kidney injury superimposed on chronic kidney disease (HCC)   Lethargy   Hypotension   DNR (do not resuscitate)   Fever -UA negative -COVID, influenza negative -MRSA PCR negative -Blood cultures pending -Chest x-ray reviewed independently, small opacity left lung base, could be atelectasis versus early pneumonia -Check strep pneumo antigen, sputum culture  -Empiric antibiotics to treat possible HCAP, Rocephin and azithromycin, in setting of fever and immunocompromise status  AKI on CKD stage IIIb -In setting of Entresto and Jardiance use -Baseline creatinine 1.1-1.4 -Creatinine  improving, gentle IVF   Chronic systolic CHF -EF <95 -Entresto and Jardiance on hold due to AKI  Status post ICD -EP following -ICD insertion site without erythema or sign of infection  CAD Hyperlipidemia -Status post LAD DES in 12/2020 -Plavix, Lipitor, Bidil   Bronchiectasis, asthma -Continue breathing treatments and nebs  History of PE/DVT -Xarelto  Diabetes mellitus -A1c 6.3 in April  -Sliding scale insulin  Rheumatoid arthritis -On Hizentra, daily prednisone -Voltaren gel as needed    DVT prophylaxis:  rivaroxaban (XARELTO) tablet 20 mg Start: 08/31/22 1700 rivaroxaban (XARELTO) tablet 20 mg  Code Status: DNR Family Communication: Daughter at bedside Disposition Plan: Home Status is: Inpatient Remains inpatient appropriate because: IV antibiotics, blood cultures are pending    Antimicrobials:  Anti-infectives (From admission, onward)    Start     Dose/Rate Route Frequency Ordered Stop   09/01/22 1200  cefTRIAXone (ROCEPHIN) 2 g in sodium chloride 0.9 % 100 mL IVPB        2 g 200 mL/hr over 30 Minutes Intravenous Every 24 hours 09/01/22 0819 09/06/22 1159   09/01/22 1000  azithromycin (ZITHROMAX) tablet 500 mg        500 mg Oral Daily 09/01/22 0819 09/06/22 0959   08/31/22 1315  vancomycin (VANCOREADY) IVPB 1250 mg/250 mL        1,250 mg 166.7 mL/hr over 90 Minutes Intravenous  Once 08/31/22 1300 08/31/22 1528   08/31/22 1315  ceFEPIme (MAXIPIME) 2 g in sodium chloride 0.9 % 100 mL IVPB  Status:  Discontinued        2 g 200  mL/hr over 30 Minutes Intravenous Every 24 hours 08/31/22 1300 08/31/22 1543   08/31/22 1300  vancomycin variable dose per unstable renal function (pharmacist dosing)  Status:  Discontinued         Does not apply See admin instructions 08/31/22 1300 08/31/22 1543        Objective: Vitals:   09/01/22 0755 09/01/22 0955 09/01/22 1008 09/01/22 1144  BP: 120/77 124/62    Pulse: 86 86    Resp: 18 18    Temp: (!) 101.9 F (38.8  C) 98.7 F (37.1 C) 98.7 F (37.1 C) 98.9 F (37.2 C)  TempSrc:   Oral Oral  SpO2: 93% 93%    Weight:      Height:        Intake/Output Summary (Last 24 hours) at 09/01/2022 1242 Last data filed at 09/01/2022 1000 Gross per 24 hour  Intake 2473.84 ml  Output 800 ml  Net 1673.84 ml   Filed Weights   08/31/22 1212 08/31/22 1451  Weight: 59 kg 58.6 kg    Examination:  General exam: Appears calm and comfortable, fatigued and lethargic Respiratory system: Clear to auscultation. Respiratory effort normal. No respiratory distress. No conversational dyspnea.  On room air Cardiovascular system: S1 & S2 heard, RRR. No murmurs. No pedal edema. Gastrointestinal system: Abdomen is nondistended, soft and nontender. Normal bowel sounds heard. Central nervous system: Alert and oriented. No focal neurological deficits. Speech clear.  Extremities: Symmetric in appearance  Skin: No rashes, lesions or ulcers on exposed skin  Psychiatry: Judgement and insight appear normal. Mood & affect appropriate.   Data Reviewed: I have personally reviewed following labs and imaging studies  CBC: Recent Labs  Lab 08/31/22 1149 09/01/22 0219  WBC 13.2* 10.7*  NEUTROABS 9.9*  --   HGB 10.1* 9.9*  HCT 31.2* 30.9*  MCV 110.2* 106.2*  PLT 132* 142*   Basic Metabolic Panel: Recent Labs  Lab 08/31/22 1149 09/01/22 0219  NA 137 139  K 4.6 3.9  CL 107 110  CO2 21* 19*  GLUCOSE 147* 96  BUN 36* 36*  CREATININE 2.58* 1.82*  CALCIUM 8.8* 8.7*   GFR: Estimated Creatinine Clearance: 21.7 mL/min (A) (by C-G formula based on SCr of 1.82 mg/dL (H)). Liver Function Tests: Recent Labs  Lab 08/31/22 1149  AST 37  ALT 24  ALKPHOS 49  BILITOT 0.5  PROT 6.5  ALBUMIN 2.7*   No results for input(s): "LIPASE", "AMYLASE" in the last 168 hours. No results for input(s): "AMMONIA" in the last 168 hours. Coagulation Profile: Recent Labs  Lab 08/31/22 1149  INR 1.5*   Cardiac Enzymes: No results for  input(s): "CKTOTAL", "CKMB", "CKMBINDEX", "TROPONINI" in the last 168 hours. BNP (last 3 results) No results for input(s): "PROBNP" in the last 8760 hours. HbA1C: No results for input(s): "HGBA1C" in the last 72 hours. CBG: Recent Labs  Lab 08/27/22 1451 08/31/22 1622 08/31/22 2204 09/01/22 0732 09/01/22 1142  GLUCAP 84 125* 105* 92 187*   Lipid Profile: No results for input(s): "CHOL", "HDL", "LDLCALC", "TRIG", "CHOLHDL", "LDLDIRECT" in the last 72 hours. Thyroid Function Tests: No results for input(s): "TSH", "T4TOTAL", "FREET4", "T3FREE", "THYROIDAB" in the last 72 hours. Anemia Panel: No results for input(s): "VITAMINB12", "FOLATE", "FERRITIN", "TIBC", "IRON", "RETICCTPCT" in the last 72 hours. Sepsis Labs: Recent Labs  Lab 08/31/22 1224 08/31/22 1609  PROCALCITON  --  0.47  LATICACIDVEN 1.7  --     Recent Results (from the past 240 hour(s))  Blood Culture (routine  x 2)     Status: None (Preliminary result)   Collection Time: 08/31/22 11:49 AM   Specimen: BLOOD RIGHT HAND  Result Value Ref Range Status   Specimen Description BLOOD RIGHT HAND  Final   Special Requests   Final    BOTTLES DRAWN AEROBIC AND ANAEROBIC Blood Culture adequate volume   Culture   Final    NO GROWTH < 24 HOURS Performed at [facility], [address] [place], [place] [address]    Report Status PENDING  Incomplete  Resp panel by RT-PCR (RSV, Flu A&B, Covid) Anterior Nasal Swab     Status: None   Collection Time: 08/31/22 11:49 AM   Specimen: Anterior Nasal Swab  Result Value Ref Range Status   SARS Coronavirus 2 by RT PCR NEGATIVE NEGATIVE Final   Influenza A by PCR NEGATIVE NEGATIVE Final   Influenza B by PCR NEGATIVE NEGATIVE Final    Comment: (NOTE) The Xpert Xpress SARS-CoV-2/FLU/RSV plus assay is intended as an aid in the diagnosis of influenza from Nasopharyngeal swab specimens and should not be used as a sole basis for treatment. Nasal washings and aspirates are  unacceptable for Xpert Xpress SARS-CoV-2/FLU/RSV testing.  Fact Sheet for Patients: BloggerCourse.com  Fact Sheet for Healthcare Providers: SeriousBroker.it  This test is not yet approved or cleared by the [place] FDA and has been authorized for detection and/or diagnosis of SARS-CoV-2 by FDA under an Emergency Use Authorization (EUA). This EUA will remain in effect (meaning this test can be used) for the duration of the COVID-19 declaration under Section 564(b)(1) of the Act, 21 U.S.C. section 360bbb-3(b)(1), unless the authorization is terminated or revoked.     Resp Syncytial Virus by PCR NEGATIVE NEGATIVE Final    Comment: (NOTE) Fact Sheet for Patients: BloggerCourse.com  Fact Sheet for Healthcare Providers: SeriousBroker.it  This test is not yet approved or cleared by the [place] FDA and has been authorized for detection and/or diagnosis of SARS-CoV-2 by FDA under an Emergency Use Authorization (EUA). This EUA will remain in effect (meaning this test can be used) for the duration of the COVID-19 declaration under Section 564(b)(1) of the Act, 21 U.S.C. section 360bbb-3(b)(1), unless the authorization is terminated or revoked.  Performed at [facility], [address] [place], [place] [address]   Blood Culture (routine x 2)     Status: None (Preliminary result)   Collection Time: 08/31/22 11:54 AM   Specimen: BLOOD RIGHT FOREARM  Result Value Ref Range Status   Specimen Description BLOOD RIGHT FOREARM  Final   Special Requests   Final    BOTTLES DRAWN AEROBIC AND ANAEROBIC Blood Culture results may not be optimal due to an excessive volume of blood received in culture bottles   Culture   Final    NO GROWTH < 24 HOURS Performed at [facility], [address], [place], [place] [address]    Report Status PENDING  Incomplete  MRSA Next Gen by  PCR, Nasal     Status: None   Collection Time: 09/01/22  8:18 AM   Specimen: Nasal Mucosa; Nasal Swab  Result Value Ref Range Status   MRSA by PCR Next Gen NOT DETECTED NOT DETECTED Final    Comment: (NOTE) The GeneXpert MRSA Assay (FDA approved for NASAL specimens only), is one component of a comprehensive MRSA colonization surveillance program. It is not intended to diagnose MRSA infection nor to guide or monitor treatment for MRSA infections. Test performance  is not FDA approved in patients less than 70 years old. Performed at [facility], [address], [place], [place] [address]       Radiology Studies: DG Chest Port 1 View  Result Date: 08/31/2022 CLINICAL DATA:  Provided history: Questionable sepsis-evaluate for abnormality. EXAM: PORTABLE CHEST 1 VIEW COMPARISON:  Prior chest radiograph 08/27/2022 and earlier. FINDINGS: Left chest single-lead implantable cardiac device. Cardiomegaly. A nipple shadow projects over the left lung base. Small linear/bandlike opacities within the left lung base. Mild chronic elevation of the right hemidiaphragm. No appreciable airspace consolidation on the right. No evidence of pleural effusion or pneumothorax. No acute osseous abnormality identified. Incompletely imaged ACDF hardware. Metallic cerclage wires at the level of the cervicothoracic junction. Prior right shoulder arthroplasty. IMPRESSION: 1. Small linear/bandlike opacities within the left lung base. The appearance favors atelectasis. However, pneumonia is difficult to definitively exclude. 2. Cardiomegaly. Electronically Signed   By: Jackey Loge D.O.   On: 08/31/2022 12:18      Scheduled Meds:  allopurinol  100 mg Oral Q supper   arformoterol  15 mcg Nebulization BID   And   umeclidinium bromide  1 puff Inhalation Daily   atorvastatin  80 mg Oral Q supper   azithromycin  500 mg Oral Daily   budesonide  0.5 mg Nebulization BID   clopidogrel  75 mg Oral Daily   cyproheptadine   12 mg Oral QHS   docusate sodium  100 mg Oral BID   famotidine  20 mg Oral QHS   feeding supplement  237 mL Oral BID BM   gabapentin  300 mg Oral QHS   guaiFENesin  600 mg Oral BID   insulin aspart  0-5 Units Subcutaneous QHS   insulin aspart  0-9 Units Subcutaneous TID WC   isosorbide-hydrALAZINE  1 tablet Oral BID   loratadine  10 mg Oral QHS   mirtazapine  30 mg Oral QHS   montelukast  10 mg Oral QPC supper   multivitamin with minerals  1 tablet Oral Daily   pantoprazole  40 mg Oral Daily   polyvinyl alcohol  1 drop Both Eyes BID   predniSONE  2 mg Oral q AM   predniSONE  5 mg Oral q AM   rivaroxaban  20 mg Oral Q supper   sodium chloride flush  3 mL Intravenous Q12H   Continuous Infusions:  cefTRIAXone (ROCEPHIN)  IV 2 g (09/01/22 1147)   lactated ringers 50 mL/hr at 08/31/22 1528     LOS: 1 day   Time spent: 40 minutes   Noralee Stain, DO [facility] 09/01/2022, 12:42 PM   Available via Epic secure chat 7am-7pm After these hours, please refer to coverage provider listed on [email or web address]

## 2022-09-02 DIAGNOSIS — R509 Fever, unspecified: Secondary | ICD-10-CM | POA: Diagnosis not present

## 2022-09-02 LAB — GLUCOSE, CAPILLARY
Glucose-Capillary: 83 mg/dL (ref 70–99)
Glucose-Capillary: 164 mg/dL — ABNORMAL HIGH (ref 70–99)
Glucose-Capillary: 132 mg/dL — ABNORMAL HIGH (ref 70–99)

## 2022-09-02 LAB — BASIC METABOLIC PANEL
Anion gap: 10 (ref 5–15)
BUN: 32 mg/dL — ABNORMAL HIGH (ref 8–23)
CO2: 20 mmol/L — ABNORMAL LOW (ref 22–32)
Calcium: 8.6 mg/dL — ABNORMAL LOW (ref 8.9–10.3)
Chloride: 110 mmol/L (ref 98–111)
Creatinine, Ser: 1.51 mg/dL — ABNORMAL HIGH (ref 0.44–1.00)
GFR, Estimated: 36 mL/min — ABNORMAL LOW (ref >60)
Glucose, Bld: 93 mg/dL (ref 70–99)
Potassium: 3.7 mmol/L (ref 3.5–5.1)
Sodium: 140 mmol/L (ref 135–145)

## 2022-09-02 LAB — CBC
HCT: 27.7 % — ABNORMAL LOW (ref 36.0–46.0)
Hemoglobin: 9.1 g/dL — ABNORMAL LOW (ref 12.0–15.0)
MCH: 34.3 pg — ABNORMAL HIGH (ref 26.0–34.0)
MCHC: 32.9 g/dL (ref 30.0–36.0)
MCV: 104.5 fL — ABNORMAL HIGH (ref 80.0–100.0)
Platelets: 138 10*3/uL — ABNORMAL LOW (ref 150–400)
RBC: 2.65 MIL/uL — ABNORMAL LOW (ref 3.87–5.11)
RDW: 14.2 % (ref 11.5–15.5)
WBC: 11.4 10*3/uL — ABNORMAL HIGH (ref 4.0–10.5)
nRBC: 0 % (ref 0.0–0.2)

## 2022-09-02 IMAGING — DX DG SHOULDER 2+V PORT*R*
1 series · 1 of 1 positions shown · non-contrast
Comparison: Plain films right shoulder 12/17/2019.

CLINICAL DATA: Status post right shoulder replacement today.

EXAM:
PORTABLE RIGHT SHOULDER

[shoulder ap]
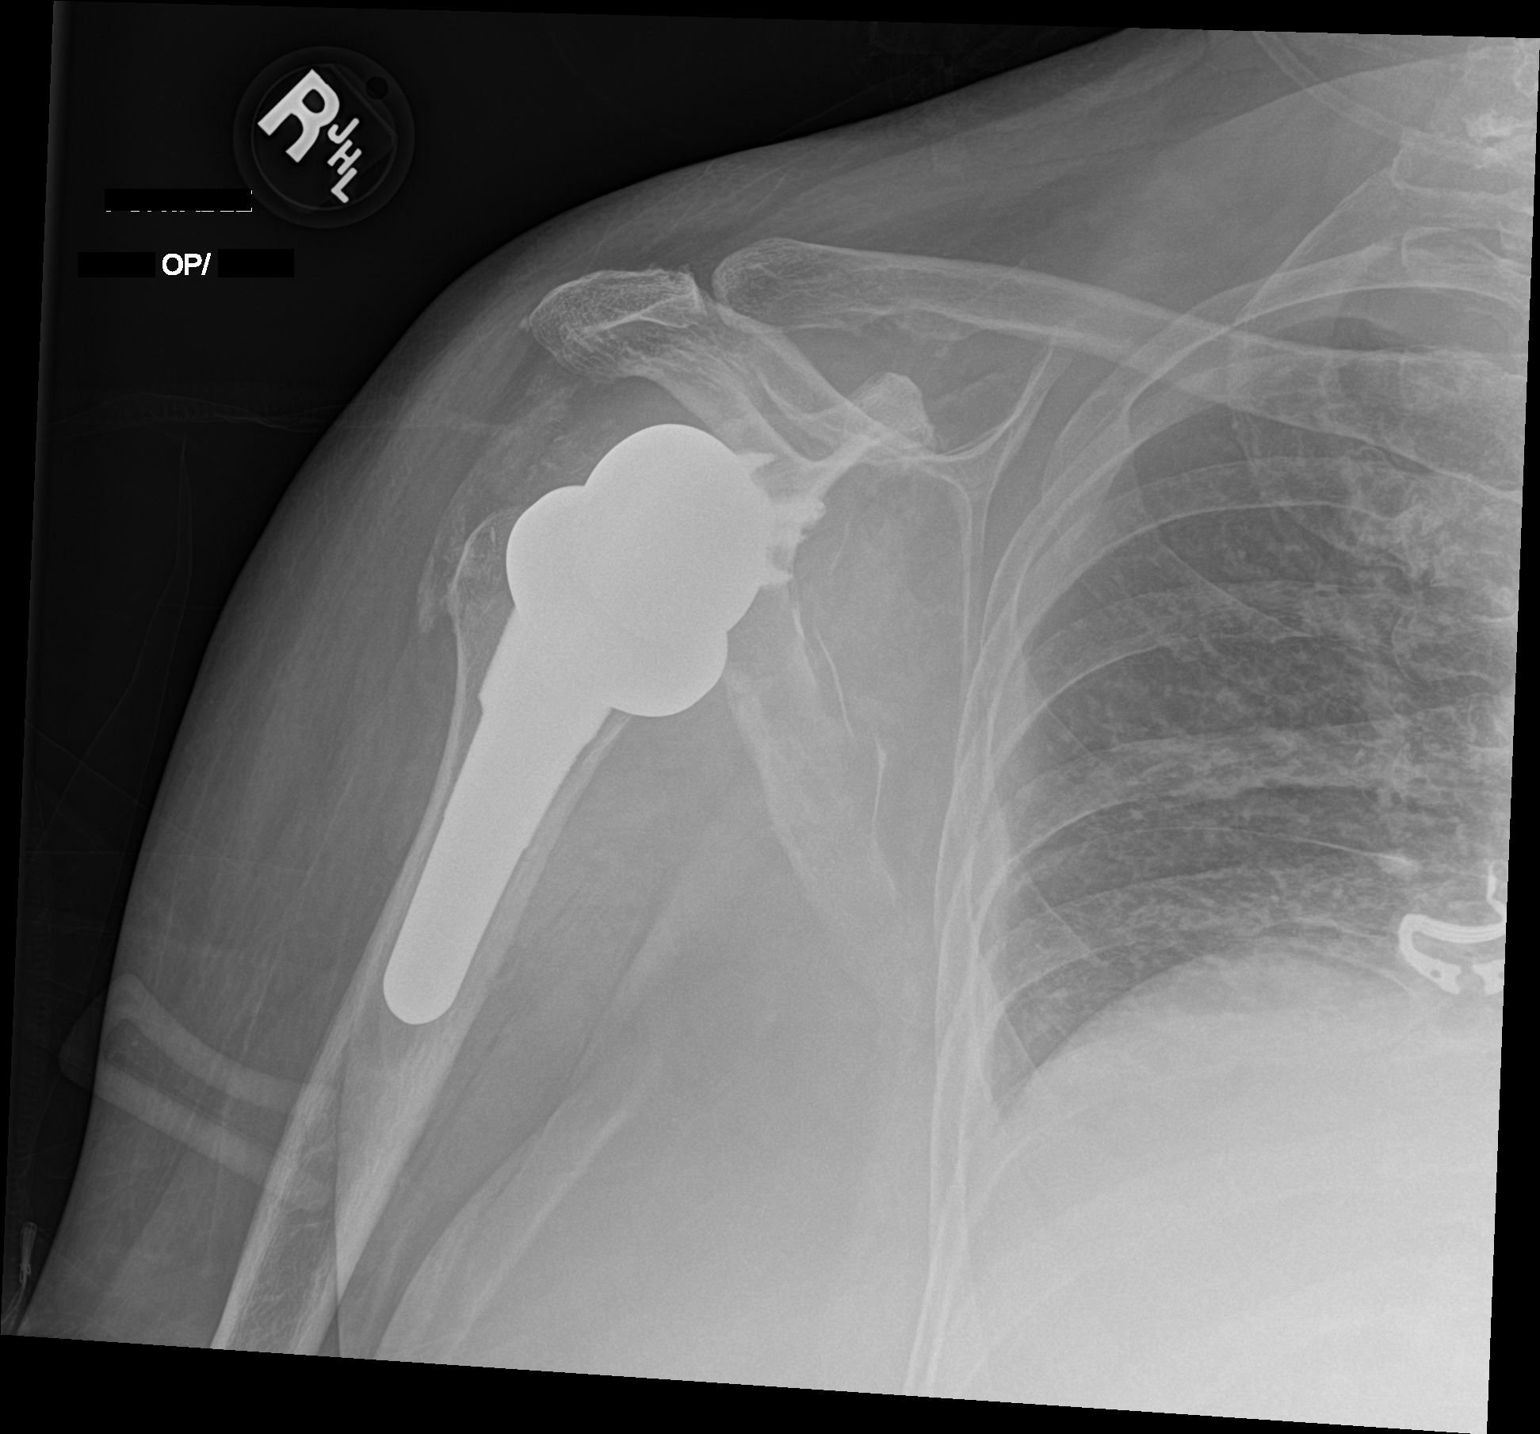

[1 of 1 positions shown; findings below may reference images not displayed]

FINDINGS: New reverse shoulder arthroplasty is in place. The device is
located. There is no fracture. Calcifications over the greater
tuberosity correlate with bursal calcifications seen on prior CT.
IMPRESSION: Status post right shoulder arthroplasty.  No acute finding.

## 2022-09-02 MED ORDER — GABAPENTIN 300 MG PO CAPS
300.0000 mg | ORAL_CAPSULE | Freq: Once | ORAL | Status: AC
  Administered 2022-09-02: 300 mg via ORAL
  Filled 2022-09-02: qty 1

## 2022-09-02 MED ORDER — TRAMADOL HCL 50 MG PO TABS
50.0000 mg | ORAL_TABLET | Freq: Every evening | ORAL | Status: DC | PRN
  Administered 2022-09-02 – 2022-09-04 (×3): 50 mg via ORAL
  Filled 2022-09-02 (×3): qty 1

## 2022-09-02 NOTE — Plan of Care (Signed)
  Problem: Education: Goal: Knowledge of cardiac device and self-care will improve Outcome: Progressing Goal: Ability to safely manage health related needs after discharge will improve Outcome: Progressing Goal: Individualized Educational Video(s) Outcome: Progressing   Problem: Cardiac: Goal: Ability to achieve and maintain adequate cardiopulmonary perfusion will improve Outcome: Progressing   Problem: Education: Goal: Knowledge of General Education information will improve Description Including pain rating scale, medication(s)/side effects and non-pharmacologic comfort measures Outcome: Progressing   Problem: Health Behavior/Discharge Planning: Goal: Ability to manage health-related needs will improve Outcome: Progressing   Problem: Clinical Measurements: Goal: Ability to maintain clinical measurements within normal limits will improve Outcome: Progressing Goal: Will remain free from infection Outcome: Progressing Goal: Diagnostic test results will improve Outcome: Progressing Goal: Respiratory complications will improve Outcome: Progressing Goal: Cardiovascular complication will be avoided Outcome: Progressing   Problem: Activity: Goal: Risk for activity intolerance will decrease Outcome: Progressing   Problem: Nutrition: Goal: Adequate nutrition will be maintained Outcome: Progressing   Problem: Coping: Goal: Level of anxiety will decrease Outcome: Progressing   Problem: Elimination: Goal: Will not experience complications related to bowel motility Outcome: Progressing Goal: Will not experience complications related to urinary retention Outcome: Progressing   Problem: Pain Managment: Goal: General experience of comfort will improve Outcome: Progressing   Problem: Safety: Goal: Ability to remain free from injury will improve Outcome: Progressing   Problem: Skin Integrity: Goal: Risk for impaired skin integrity will decrease Outcome: Progressing   

## 2022-09-02 NOTE — Progress Notes (Signed)
PROGRESS NOTE    Ebony Scott  RUE:[id number] DOB: [birth year] DOA: 08/31/2022 PCP: [name], MD     Brief Narrative:  Ebony Scott is a 72 y.o. female with medical history significant of DVT, remote breast cancer, bronchiectasis, chronic systolic CHF, fibromyalgia, GAVE, HLD, hypothyroidism, and RA presenting with fatigue and poor PO intake following AICD insertion Monday (7/8).   Since then, she has been lethargic with poor PO intake, "low grade fever" <100.4 at home.  She had 1 episode of bilious emesis a few days ago.  Chronic cough from bronchiectasis without overt change.  No frank urinary symptoms other than 1 episode of incontinence (has made very little urine).   Due to fever of 101.9, patient was started on empiric antibiotics to cover for HCAP.   New events last 24 hours / Subjective: Patient feeling weak still, afebrile overnight.  Overall looks a little better than yesterday.  Complains of neuropathy, which is chronic  Assessment & Plan:   Principal Problem:   Fever Active Problems:   Coronary artery calcification seen on CAT scan   Chronic systolic CHF (congestive heart failure) (HCC)   Hyperlipidemia   Severe persistent asthma   Chronic kidney disease, stage 3b (HCC)   History of pulmonary embolus (PE)   DMII (diabetes mellitus, type 2) (HCC)   Acute kidney injury superimposed on chronic kidney disease (HCC)   Lethargy   Hypotension   DNR (do not resuscitate)   Fever, concern for HCAP -UA negative -COVID, influenza negative -MRSA PCR negative -Blood cultures negative to date -Strep pneumo antigen negative, sputum culture was not acceptable for testing -Chest x-ray reviewed independently, small opacity left lung base, could be atelectasis versus early pneumonia -Empiric antibiotics to treat possible HCAP, Rocephin and azithromycin, in setting of fever and immunocompromise status  AKI on CKD stage IIIb -In setting of Entresto and Jardiance use -Baseline  creatinine 1.1-1.4 -Improved, creatinine 1.5 today -Stop IV fluid  Chronic systolic CHF -EF <14 -Entresto and Jardiance on hold due to AKI  Status post ICD -EP following -ICD insertion site without erythema or sign of infection  CAD Hyperlipidemia -Status post LAD DES in 12/2020 -Plavix, Lipitor, Bidil   Bronchiectasis, asthma -Continue breathing treatments and nebs  History of PE/DVT -Xarelto dose reduced due to renal dysfunction  Diabetes mellitus, with peripheral neuropathy -A1c 6.3 in April  -Sliding scale insulin  Rheumatoid arthritis -On Hizentra, daily prednisone -Voltaren gel as needed    DVT prophylaxis:  rivaroxaban (XARELTO) tablet 10 mg Start: 09/01/22 1700 rivaroxaban (XARELTO) tablet 10 mg  Code Status: DNR Family Communication: Left voicemail for daughter Disposition Plan: Home Status is: Inpatient Remains inpatient appropriate because: IV antibiotics, blood cultures are pending    Antimicrobials:  Anti-infectives (From admission, onward)    Start     Dose/Rate Route Frequency Ordered Stop   09/01/22 1200  cefTRIAXone (ROCEPHIN) 2 g in sodium chloride 0.9 % 100 mL IVPB        2 g 200 mL/hr over 30 Minutes Intravenous Every 24 hours 09/01/22 0819 09/06/22 1159   09/01/22 1000  azithromycin (ZITHROMAX) tablet 500 mg        500 mg Oral Daily 09/01/22 0819 09/06/22 0959   08/31/22 1315  vancomycin (VANCOREADY) IVPB 1250 mg/250 mL        1,250 mg 166.7 mL/hr over 90 Minutes Intravenous  Once 08/31/22 1300 08/31/22 1528   08/31/22 1315  ceFEPIme (MAXIPIME) 2 g in sodium chloride 0.9 % 100  mL IVPB  Status:  Discontinued        2 g 200 mL/hr over 30 Minutes Intravenous Every 24 hours 08/31/22 1300 08/31/22 1543   08/31/22 1300  vancomycin variable dose per unstable renal function (pharmacist dosing)  Status:  Discontinued         Does not apply See admin instructions 08/31/22 1300 08/31/22 1543        Objective: Vitals:   09/02/22 0436  09/02/22 0750 09/02/22 0805 09/02/22 1144  BP: (!) 116/58 (!) 138/56  136/62  Pulse: 78 79  82  Resp: 18 18  18   Temp: 97.8 F (36.6 C) 99.5 F (37.5 C)  97.8 F (36.6 C)  TempSrc: Oral Oral  Oral  SpO2: 94% 96% 96% 95%  Weight:      Height:        Intake/Output Summary (Last 24 hours) at 09/02/2022 1223 Last data filed at 09/02/2022 0600 Gross per 24 hour  Intake 940 ml  Output 200 ml  Net 740 ml   Filed Weights   08/31/22 1212 08/31/22 1451  Weight: 59 kg 58.6 kg    Examination:  General exam: Appears calm and comfortable Respiratory system: Clear to auscultation. Respiratory effort normal. No respiratory distress. No conversational dyspnea.  On room air Cardiovascular system: S1 & S2 heard, RRR. No murmurs. No pedal edema. Gastrointestinal system: Abdomen is nondistended, soft and nontender. Normal bowel sounds heard. Central nervous system: Alert and oriented. No focal neurological deficits. Speech clear.  Extremities: Symmetric in appearance  Skin: No rashes, lesions or ulcers on exposed skin  Psychiatry: Judgement and insight appear normal. Mood & affect appropriate.   Data Reviewed: I have personally reviewed following labs and imaging studies  CBC: Recent Labs  Lab 08/31/22 1149 09/01/22 0219 09/02/22 0147  WBC 13.2* 10.7* 11.4*  NEUTROABS 9.9*  --   --   HGB 10.1* 9.9* 9.1*  HCT 31.2* 30.9* 27.7*  MCV 110.2* 106.2* 104.5*  PLT 132* 142* 138*   Basic Metabolic Panel: Recent Labs  Lab 08/31/22 1149 09/01/22 0219 09/02/22 0147  NA 137 139 140  K 4.6 3.9 3.7  CL 107 110 110  CO2 21* 19* 20*  GLUCOSE 147* 96 93  BUN 36* 36* 32*  CREATININE 2.58* 1.82* 1.51*  CALCIUM 8.8* 8.7* 8.6*   GFR: Estimated Creatinine Clearance: 26.2 mL/min (A) (by C-G formula based on SCr of 1.51 mg/dL (H)). Liver Function Tests: Recent Labs  Lab 08/31/22 1149  AST 37  ALT 24  ALKPHOS 49  BILITOT 0.5  PROT 6.5  ALBUMIN 2.7*   No results for input(s): "LIPASE",  "AMYLASE" in the last 168 hours. No results for input(s): "AMMONIA" in the last 168 hours. Coagulation Profile: Recent Labs  Lab 08/31/22 1149  INR 1.5*   Cardiac Enzymes: No results for input(s): "CKTOTAL", "CKMB", "CKMBINDEX", "TROPONINI" in the last 168 hours. BNP (last 3 results) No results for input(s): "PROBNP" in the last 8760 hours. HbA1C: No results for input(s): "HGBA1C" in the last 72 hours. CBG: Recent Labs  Lab 09/01/22 0732 09/01/22 1142 09/01/22 1534 09/01/22 2217 09/02/22 0748  GLUCAP 92 187* 103* 101* 83   Lipid Profile: No results for input(s): "CHOL", "HDL", "LDLCALC", "TRIG", "CHOLHDL", "LDLDIRECT" in the last 72 hours. Thyroid Function Tests: No results for input(s): "TSH", "T4TOTAL", "FREET4", "T3FREE", "THYROIDAB" in the last 72 hours. Anemia Panel: No results for input(s): "VITAMINB12", "FOLATE", "FERRITIN", "TIBC", "IRON", "RETICCTPCT" in the last 72 hours. Sepsis Labs: Recent Labs  Lab 08/31/22 1224 08/31/22 1609  PROCALCITON  --  0.47  LATICACIDVEN 1.7  --     Recent Results (from the past 240 hour(s))  Blood Culture (routine x 2)     Status: None (Preliminary result)   Collection Time: 08/31/22 11:49 AM   Specimen: BLOOD RIGHT HAND  Result Value Ref Range Status   Specimen Description BLOOD RIGHT HAND  Final   Special Requests   Final    BOTTLES DRAWN AEROBIC AND ANAEROBIC Blood Culture adequate volume   Culture   Final    NO GROWTH 2 DAYS Performed at [facility], [address] [place], [place] [address]    Report Status PENDING  Incomplete  Resp panel by RT-PCR (RSV, Flu A&B, Covid) Anterior Nasal Swab     Status: None   Collection Time: 08/31/22 11:49 AM   Specimen: Anterior Nasal Swab  Result Value Ref Range Status   SARS Coronavirus 2 by RT PCR NEGATIVE NEGATIVE Final   Influenza A by PCR NEGATIVE NEGATIVE Final   Influenza B by PCR NEGATIVE NEGATIVE Final    Comment: (NOTE) The Xpert Xpress SARS-CoV-2/FLU/RSV plus  assay is intended as an aid in the diagnosis of influenza from Nasopharyngeal swab specimens and should not be used as a sole basis for treatment. Nasal washings and aspirates are unacceptable for Xpert Xpress SARS-CoV-2/FLU/RSV testing.  Fact Sheet for Patients: BloggerCourse.com  Fact Sheet for Healthcare Providers: SeriousBroker.it  This test is not yet approved or cleared by the [place] FDA and has been authorized for detection and/or diagnosis of SARS-CoV-2 by FDA under an Emergency Use Authorization (EUA). This EUA will remain in effect (meaning this test can be used) for the duration of the COVID-19 declaration under Section 564(b)(1) of the Act, 21 U.S.C. section 360bbb-3(b)(1), unless the authorization is terminated or revoked.     Resp Syncytial Virus by PCR NEGATIVE NEGATIVE Final    Comment: (NOTE) Fact Sheet for Patients: BloggerCourse.com  Fact Sheet for Healthcare Providers: SeriousBroker.it  This test is not yet approved or cleared by the [place] FDA and has been authorized for detection and/or diagnosis of SARS-CoV-2 by FDA under an Emergency Use Authorization (EUA). This EUA will remain in effect (meaning this test can be used) for the duration of the COVID-19 declaration under Section 564(b)(1) of the Act, 21 U.S.C. section 360bbb-3(b)(1), unless the authorization is terminated or revoked.  Performed at [facility], [address] [place], [place] [address]   Blood Culture (routine x 2)     Status: None (Preliminary result)   Collection Time: 08/31/22 11:54 AM   Specimen: BLOOD RIGHT FOREARM  Result Value Ref Range Status   Specimen Description BLOOD RIGHT FOREARM  Final   Special Requests   Final    BOTTLES DRAWN AEROBIC AND ANAEROBIC Blood Culture results may not be optimal due to an excessive volume of blood received in culture  bottles   Culture   Final    NO GROWTH 2 DAYS Performed at [facility], [address] [place], [place] [address]    Report Status PENDING  Incomplete  MRSA Next Gen by PCR, Nasal     Status: None   Collection Time: 09/01/22  8:18 AM   Specimen: Nasal Mucosa; Nasal Swab  Result Value Ref Range Status   MRSA by PCR Next Gen NOT DETECTED NOT DETECTED Final    Comment: (NOTE) The GeneXpert MRSA Assay (FDA approved for NASAL specimens only),  is one component of a comprehensive MRSA colonization surveillance program. It is not intended to diagnose MRSA infection nor to guide or monitor treatment for MRSA infections. Test performance is not FDA approved in patients less than 70 years old. Performed at [facility], [address], [place], [place] [address]   Expectorated Sputum Assessment w Gram Stain, Rflx to Resp Cult     Status: None   Collection Time: 09/01/22  8:19 AM   Specimen: Expectorated Sputum  Result Value Ref Range Status   Specimen Description EXPECTORATED SPUTUM  Final   Special Requests NONE  Final   Sputum evaluation   Final    Sputum specimen not acceptable for testing.  Please recollect.   Performed at [facility], [address] [place], [place] [address]    Report Status 09/01/2022 FINAL  Final      Radiology Studies: No results found.    Scheduled Meds:  allopurinol  100 mg Oral Q supper   arformoterol  15 mcg Nebulization BID   And   umeclidinium bromide  1 puff Inhalation Daily   atorvastatin  80 mg Oral Q supper   azithromycin  500 mg Oral Daily   budesonide  0.5 mg Nebulization BID   clopidogrel  75 mg Oral Daily   cyproheptadine  12 mg Oral QHS   docusate sodium  100 mg Oral BID   famotidine  20 mg Oral QHS   feeding supplement  237 mL Oral BID BM   gabapentin  300 mg Oral QHS   guaiFENesin  600 mg Oral BID   insulin aspart  0-5 Units Subcutaneous QHS   insulin aspart  0-9 Units Subcutaneous TID WC    isosorbide-hydrALAZINE  1 tablet Oral BID   loratadine  10 mg Oral QHS   mirtazapine  30 mg Oral QHS   montelukast  10 mg Oral QPC supper   multivitamin with minerals  1 tablet Oral Daily   pantoprazole  40 mg Oral Daily   polyvinyl alcohol  1 drop Both Eyes BID   predniSONE  2 mg Oral q AM   predniSONE  5 mg Oral q AM   rivaroxaban  10 mg Oral Q supper   sodium chloride flush  3 mL Intravenous Q12H   Continuous Infusions:  cefTRIAXone (ROCEPHIN)  IV 2 g (09/01/22 1147)     LOS: 2 days   Time spent: 25 minutes   Noralee Stain, DO [facility] 09/02/2022, 12:23 PM   Available via Epic secure chat 7am-7pm After these hours, please refer to coverage provider listed on [email or web address]

## 2022-09-02 NOTE — Evaluation (Signed)
Occupational Therapy Evaluation Patient Details Name: Ebony Scott MRN: [id number] DOB: 1951-01-02 Today's Date: 09/02/2022   History of Present Illness 72 y.o. female admitted 7/12 with fever, presenting with fatigue and poor PO intake following AICD insertion Monday (7/8). Past medical history significant of DVT, remote breast cancer, bronchiectasis, chronic systolic CHF, fibromyalgia, GAVE, HLD, hypothyroidism, and RA.   Clinical Impression   Pt reports using rollator at baseline for mobility, assist for LB ADL, lives with daughter who can assist and also reports she has a PRN caregiver she can call and assists with transportation. Pt currently needing set up  - mod A for ADLs, min guard for bed mobility and min A for transfers with RW. Reviewed ICD/pacemaker precautions with pt and pt verbalized understanding. Pt presenting with impairments listed below, will follow acutely. Recommend HHOT at d/c.      Recommendations for follow up therapy are one component of a multi-disciplinary discharge planning process, led by the attending physician.  Recommendations may be updated based on patient status, additional functional criteria and insurance authorization.   Assistance Recommended at Discharge Intermittent Supervision/Assistance  Patient can return home with the following A little help with walking and/or transfers;A little help with bathing/dressing/bathroom;Assistance with cooking/housework;Direct supervision/assist for medications management;Direct supervision/assist for financial management;Assist for transportation;Help with stairs or ramp for entrance    Functional Status Assessment  Patient has had a recent decline in their functional status and demonstrates the ability to make significant improvements in function in a reasonable and predictable amount of time.  Equipment Recommendations  Tub/shower seat    Recommendations for Other Services PT consult     Precautions /  Restrictions Precautions Precautions: ICD/Pacemaker Precaution Comments: Reviewed precautions and pt able to verbalize; iplant was 7/8 Restrictions Weight Bearing Restrictions: No      Mobility Bed Mobility Overal bed mobility: Needs Assistance Bed Mobility: Supine to Sit     Supine to sit: Min guard          Transfers Overall transfer level: Needs assistance Equipment used: Rolling walker (2 wheels) Transfers: Sit to/from Stand Sit to Stand: Min assist                  Balance Overall balance assessment: Needs assistance Sitting-balance support: No upper extremity supported, Feet supported Sitting balance-Leahy Scale: Good     Standing balance support: During functional activity, Reliant on assistive device for balance Standing balance-Leahy Scale: Poor Standing balance comment: RW support                           ADL either performed or assessed with clinical judgement   ADL Overall ADL's : Needs assistance/impaired Eating/Feeding: Set up;Sitting   Grooming: Wash/dry hands;Min guard;Standing   Upper Body Bathing: Sitting;Minimal assistance   Lower Body Bathing: Sitting/lateral leans;Moderate assistance   Upper Body Dressing : Sitting;Minimal assistance   Lower Body Dressing: Moderate assistance   Toilet Transfer: Min guard;Rolling walker (2 wheels);Ambulation;Regular Social worker and Hygiene: Supervision/safety       Functional mobility during ADLs: Min guard;Rolling walker (2 wheels)       Vision   Vision Assessment?: No apparent visual deficits     Perception Perception Perception Tested?: No   Praxis Praxis Praxis tested?: Not tested    Pertinent Vitals/Pain Pain Assessment Pain Assessment: Faces Pain Score: 4  Faces Pain Scale: Hurts little more Pain Location: bil Feet - reports hx of neuropathy. Pain Descriptors /  Indicators: Discomfort Pain Intervention(s): Limited activity within  patient's tolerance, Monitored during session, Repositioned     Hand Dominance Right   Extremity/Trunk Assessment Upper Extremity Assessment Upper Extremity Assessment: Generalized weakness   Lower Extremity Assessment Lower Extremity Assessment: Defer to PT evaluation       Communication Communication Communication: No difficulties   Cognition Arousal/Alertness: Awake/alert Behavior During Therapy: WFL for tasks assessed/performed Overall Cognitive Status: No family/caregiver present to determine baseline cognitive functioning                                 General Comments: noted slowed processing; pt thinks she has been in the hospital x1 week but has been here for 2 days.     General Comments  VSS on RA    Exercises     Shoulder Instructions      Home Living Family/patient expects to be discharged to:: Private residence Living Arrangements: Children (daughter) Available Help at Discharge: Family;Available PRN/intermittently Type of Home: House Home Access: Stairs to enter [employer] of Steps: 6 Entrance Stairs-Rails: Right;Left Home Layout: Two level;Able to live on main level with bedroom/bathroom     Bathroom Shower/Tub: Producer, television/film/video: Handicapped height Bathroom Accessibility: Yes   Home Equipment: Rollator (4 wheels)   Additional Comments: has caregiver PRN      Prior Functioning/Environment Prior Level of Function : Independent/Modified Independent             Mobility Comments: rollator in and out of house ADLs Comments: Able to perform ADLs. Asisst to don compression stockings, daughter assist wiht meals PRN, caregiver does tranportation        OT Problem List: Decreased strength;Decreased range of motion;Decreased activity tolerance;Impaired balance (sitting and/or standing);Decreased safety awareness;Decreased cognition      OT Treatment/Interventions: Therapeutic exercise;Self-care/ADL  training;Energy conservation;DME and/or AE instruction;Therapeutic activities;Patient/family education;Balance training    OT Goals(Current goals can be found in the care plan section) Acute Rehab OT Goals Patient Stated Goal: none stated OT Goal Formulation: With patient Time For Goal Achievement: 09/16/22 Potential to Achieve Goals: Good ADL Goals Pt Will Perform Upper Body Dressing: with supervision;sitting;standing Pt Will Perform Lower Body Dressing: with supervision;sitting/lateral leans;sit to/from stand Pt Will Transfer to Toilet: with supervision;ambulating;regular height toilet Pt Will Perform Tub/Shower Transfer: Shower transfer;Tub transfer;with supervision;ambulating Additional ADL Goal #1: Pt will verbalize x3 energy conservation strategies in order to improve activity tolerance for ADLs,  OT Frequency: Min 1X/week    Co-evaluation              AM-PAC OT "6 Clicks" Daily Activity     Outcome Measure Help from another person eating meals?: None Help from another person taking care of personal grooming?: A Little Help from another person toileting, which includes using toliet, bedpan, or urinal?: A Little Help from another person bathing (including washing, rinsing, drying)?: A Lot Help from another person to put on and taking off regular upper body clothing?: A Little Help from another person to put on and taking off regular lower body clothing?: A Lot 6 Click Score: 17   End of Session Equipment Utilized During Treatment: Rolling walker (2 wheels) Nurse Communication: Mobility status  Activity Tolerance: Patient tolerated treatment well Patient left: in chair;with call bell/phone within reach;with chair alarm set  OT Visit Diagnosis: Unsteadiness on feet (R26.81);Other abnormalities of gait and mobility (R26.89);Muscle weakness (generalized) (M62.81)  Time: 1914-7829 OT Time Calculation (min): 23 min Charges:  OT General Charges $OT Visit: 1  Visit OT Evaluation $OT Eval Moderate Complexity: 1 Mod OT Treatments $Self Care/Home Management : 8-22 mins  Carver Fila, OTD, OTR/L SecureChat Preferred Acute Rehab [phone]  Eleesha Purkey K Koonce 09/02/2022, 3:00 PM

## 2022-09-02 NOTE — Progress Notes (Signed)
BRIEF EP NOTE  Chart reviewed. Blood cultures still pending.  EP will follow remotely unless cultures return positive.  Sheria Lang T. Lalla Brothers, MD, [facility], [facility] Cardiac Electrophysiology

## 2022-09-02 NOTE — Progress Notes (Signed)
TR[facility] night cross cover note:   I was notified by RN that the patient is c/o of some tingling unilateral flank discomfort and is requesting an extra dose of her home gabapentin. I subsequently placed order for one-time additional dose of home gabapentin. Patient is also on scheduled qdaily tramadol as an outpatient. I resumed home tramadol , but on a prn basis only.    Newton Pigg, DO Hospitalist

## 2022-09-03 DIAGNOSIS — R509 Fever, unspecified: Secondary | ICD-10-CM | POA: Diagnosis not present

## 2022-09-03 LAB — CBC
HCT: 29.1 % — ABNORMAL LOW (ref 36.0–46.0)
Hemoglobin: 9.5 g/dL — ABNORMAL LOW (ref 12.0–15.0)
MCH: 35.1 pg — ABNORMAL HIGH (ref 26.0–34.0)
MCHC: 32.6 g/dL (ref 30.0–36.0)
MCV: 107.4 fL — ABNORMAL HIGH (ref 80.0–100.0)
Platelets: 157 10*3/uL (ref 150–400)
RBC: 2.71 MIL/uL — ABNORMAL LOW (ref 3.87–5.11)
RDW: 14.3 % (ref 11.5–15.5)
WBC: 10.5 10*3/uL (ref 4.0–10.5)
nRBC: 0 % (ref 0.0–0.2)

## 2022-09-03 LAB — BASIC METABOLIC PANEL
Anion gap: 16 — ABNORMAL HIGH (ref 5–15)
BUN: 26 mg/dL — ABNORMAL HIGH (ref 8–23)
CO2: 21 mmol/L — ABNORMAL LOW (ref 22–32)
Calcium: 9.1 mg/dL (ref 8.9–10.3)
Chloride: 106 mmol/L (ref 98–111)
Creatinine, Ser: 1.33 mg/dL — ABNORMAL HIGH (ref 0.44–1.00)
GFR, Estimated: 42 mL/min — ABNORMAL LOW (ref >60)
Glucose, Bld: 86 mg/dL (ref 70–99)
Potassium: 4.3 mmol/L (ref 3.5–5.1)
Sodium: 143 mmol/L (ref 135–145)

## 2022-09-03 LAB — GLUCOSE, CAPILLARY
Glucose-Capillary: 230 mg/dL — ABNORMAL HIGH (ref 70–99)
Glucose-Capillary: 85 mg/dL (ref 70–99)
Glucose-Capillary: 120 mg/dL — ABNORMAL HIGH (ref 70–99)
Glucose-Capillary: 158 mg/dL — ABNORMAL HIGH (ref 70–99)
Glucose-Capillary: 131 mg/dL — ABNORMAL HIGH (ref 70–99)

## 2022-09-03 LAB — CULTURE, BLOOD (ROUTINE X 2): Culture: NO GROWTH

## 2022-09-03 NOTE — Telephone Encounter (Signed)
Clearance has been sent back to [facility]. Closing encounter. NFN

## 2022-09-03 NOTE — Progress Notes (Signed)
PROGRESS NOTE    Ebony Scott  UEA:[id number] DOB: 10/16/1950 DOA: 08/31/2022 PCP: [name], MD     Brief Narrative:  Ebony Scott is a 72 y.o. female with medical history significant of DVT, remote breast cancer, bronchiectasis, chronic systolic CHF, fibromyalgia, GAVE, HLD, hypothyroidism, and RA presenting with fatigue and poor PO intake following AICD insertion Monday (7/8).   Since then, she has been lethargic with poor PO intake, "low grade fever" <100.4 at home.  She had 1 episode of bilious emesis a few days ago.  Chronic cough from bronchiectasis without overt change.  No frank urinary symptoms other than 1 episode of incontinence (has made very little urine).   Due to fever of 101.9, patient was started on empiric antibiotics to cover for HCAP.   New events last 24 hours / Subjective: Had another episode of fever early this morning 101 Fahrenheit.  She has no new complaints.  Continues to have a cough, which is chronic in nature.  No headaches, chest pain or shortness of breath, no nausea, vomiting or diarrhea or abdominal pain, no dysuria, no skin wounds or ulcers.  Assessment & Plan:   Principal Problem:   Fever Active Problems:   Coronary artery calcification seen on CAT scan   Chronic systolic CHF (congestive heart failure) (HCC)   Hyperlipidemia   Severe persistent asthma   Chronic kidney disease, stage 3b (HCC)   History of pulmonary embolus (PE)   DMII (diabetes mellitus, type 2) (HCC)   Acute kidney injury superimposed on chronic kidney disease (HCC)   Lethargy   Hypotension   DNR (do not resuscitate)   Fever, concern for HCAP -UA negative -COVID, influenza negative -MRSA PCR negative -Blood cultures negative to date -Strep pneumo antigen negative, sputum culture was not acceptable for testing -Chest x-ray reviewed independently, small opacity left lung base, could be atelectasis versus early pneumonia -Empiric antibiotics to treat possible HCAP,  Rocephin and azithromycin, in setting of fever and immunocompromise status  AKI on CKD stage IIIb -In setting of Entresto and Jardiance use -Baseline creatinine 1.1-1.4 -Improved, creatinine 1.33  Chronic systolic CHF -EF <47 -Entresto and Jardiance on hold due to AKI  Status post ICD -EP following -ICD insertion site without erythema or sign of infection  CAD Hyperlipidemia -Status post LAD DES in 12/2020 -Plavix, Lipitor, Bidil   Bronchiectasis, asthma -Continue breathing treatments and nebs  History of PE/DVT -Xarelto dose reduced due to renal dysfunction  Diabetes mellitus, with peripheral neuropathy -A1c 6.3 in April  -Sliding scale insulin  Rheumatoid arthritis -On Hizentra, daily prednisone -Voltaren gel as needed    DVT prophylaxis:  rivaroxaban (XARELTO) tablet 10 mg Start: 09/01/22 1700 rivaroxaban (XARELTO) tablet 10 mg  Code Status: DNR Family Communication: Daughter at bedside Disposition Plan: Home Status is: Inpatient Remains inpatient appropriate because: IV antibiotics, needs to be afebrile >24 hours prior to discharge   Antimicrobials:  Anti-infectives (From admission, onward)    Start     Dose/Rate Route Frequency Ordered Stop   09/01/22 1200  cefTRIAXone (ROCEPHIN) 2 g in sodium chloride 0.9 % 100 mL IVPB        2 g 200 mL/hr over 30 Minutes Intravenous Every 24 hours 09/01/22 0819 09/06/22 1159   09/01/22 1000  azithromycin (ZITHROMAX) tablet 500 mg        500 mg Oral Daily 09/01/22 0819 09/06/22 0959   08/31/22 1315  vancomycin (VANCOREADY) IVPB 1250 mg/250 mL  1,250 mg 166.7 mL/hr over 90 Minutes Intravenous  Once 08/31/22 1300 08/31/22 1528   08/31/22 1315  ceFEPIme (MAXIPIME) 2 g in sodium chloride 0.9 % 100 mL IVPB  Status:  Discontinued        2 g 200 mL/hr over 30 Minutes Intravenous Every 24 hours 08/31/22 1300 08/31/22 1543   08/31/22 1300  vancomycin variable dose per unstable renal function (pharmacist dosing)  Status:   Discontinued         Does not apply See admin instructions 08/31/22 1300 08/31/22 1543        Objective: Vitals:   09/03/22 0412 09/03/22 0735 09/03/22 0813 09/03/22 1151  BP: 133/60  (!) 148/63 130/60  Pulse: 84  79 84  Resp: 17  18 16   Temp: (!) 101 F (38.3 C) 98.6 F (37 C) 98.6 F (37 C) 98.7 F (37.1 C)  TempSrc: Oral Oral Oral Oral  SpO2: 92% 93%    Weight:      Height:        Intake/Output Summary (Last 24 hours) at 09/03/2022 1252 Last data filed at 09/02/2022 1511 Gross per 24 hour  Intake 0 ml  Output --  Net 0 ml   Filed Weights   08/31/22 1212 08/31/22 1451  Weight: 59 kg 58.6 kg    Examination:  General exam: Appears calm and comfortable Respiratory system: Clear to auscultation. Respiratory effort normal. No respiratory distress. No conversational dyspnea.  On room air Cardiovascular system: S1 & S2 heard, RRR. No murmurs. No pedal edema. Gastrointestinal system: Abdomen is nondistended, soft and nontender. Normal bowel sounds heard. Central nervous system: Alert and oriented. No focal neurological deficits. Speech clear.  Extremities: Symmetric in appearance  Skin: No rashes, lesions or ulcers on exposed skin  Psychiatry: Judgement and insight appear normal. Mood & affect appropriate.   Data Reviewed: I have personally reviewed following labs and imaging studies  CBC: Recent Labs  Lab 08/31/22 1149 09/01/22 0219 09/02/22 0147 09/03/22 0325  WBC 13.2* 10.7* 11.4* 10.5  NEUTROABS 9.9*  --   --   --   HGB 10.1* 9.9* 9.1* 9.5*  HCT 31.2* 30.9* 27.7* 29.1*  MCV 110.2* 106.2* 104.5* 107.4*  PLT 132* 142* 138* 157   Basic Metabolic Panel: Recent Labs  Lab 08/31/22 1149 09/01/22 0219 09/02/22 0147 09/03/22 0325  NA 137 139 140 143  K 4.6 3.9 3.7 4.3  CL 107 110 110 106  CO2 21* 19* 20* 21*  GLUCOSE 147* 96 93 86  BUN 36* 36* 32* 26*  CREATININE 2.58* 1.82* 1.51* 1.33*  CALCIUM 8.8* 8.7* 8.6* 9.1   GFR: Estimated Creatinine  Clearance: 29.7 mL/min (A) (by C-G formula based on SCr of 1.33 mg/dL (H)). Liver Function Tests: Recent Labs  Lab 08/31/22 1149  AST 37  ALT 24  ALKPHOS 49  BILITOT 0.5  PROT 6.5  ALBUMIN 2.7*   No results for input(s): "LIPASE", "AMYLASE" in the last 168 hours. No results for input(s): "AMMONIA" in the last 168 hours. Coagulation Profile: Recent Labs  Lab 08/31/22 1149  INR 1.5*   Cardiac Enzymes: No results for input(s): "CKTOTAL", "CKMB", "CKMBINDEX", "TROPONINI" in the last 168 hours. BNP (last 3 results) No results for input(s): "PROBNP" in the last 8760 hours. HbA1C: No results for input(s): "HGBA1C" in the last 72 hours. CBG: Recent Labs  Lab 09/02/22 1142 09/02/22 1634 09/02/22 2123 09/03/22 0726 09/03/22 1149  GLUCAP 230* 164* 132* 85 120*   Lipid Profile: No results for  input(s): "CHOL", "HDL", "LDLCALC", "TRIG", "CHOLHDL", "LDLDIRECT" in the last 72 hours. Thyroid Function Tests: No results for input(s): "TSH", "T4TOTAL", "FREET4", "T3FREE", "THYROIDAB" in the last 72 hours. Anemia Panel: No results for input(s): "VITAMINB12", "FOLATE", "FERRITIN", "TIBC", "IRON", "RETICCTPCT" in the last 72 hours. Sepsis Labs: Recent Labs  Lab 08/31/22 1224 08/31/22 1609  PROCALCITON  --  0.47  LATICACIDVEN 1.7  --     Recent Results (from the past 240 hour(s))  Blood Culture (routine x 2)     Status: None (Preliminary result)   Collection Time: 08/31/22 11:49 AM   Specimen: BLOOD RIGHT HAND  Result Value Ref Range Status   Specimen Description BLOOD RIGHT HAND  Final   Special Requests   Final    BOTTLES DRAWN AEROBIC AND ANAEROBIC Blood Culture adequate volume   Culture   Final    NO GROWTH 3 DAYS Performed at [facility], [address] [place], [place] [address]    Report Status PENDING  Incomplete  Resp panel by RT-PCR (RSV, Flu A&B, Covid) Anterior Nasal Swab     Status: None   Collection Time: 08/31/22 11:49 AM   Specimen: Anterior Nasal  Swab  Result Value Ref Range Status   SARS Coronavirus 2 by RT PCR NEGATIVE NEGATIVE Final   Influenza A by PCR NEGATIVE NEGATIVE Final   Influenza B by PCR NEGATIVE NEGATIVE Final    Comment: (NOTE) The Xpert Xpress SARS-CoV-2/FLU/RSV plus assay is intended as an aid in the diagnosis of influenza from Nasopharyngeal swab specimens and should not be used as a sole basis for treatment. Nasal washings and aspirates are unacceptable for Xpert Xpress SARS-CoV-2/FLU/RSV testing.  Fact Sheet for Patients: BloggerCourse.com  Fact Sheet for Healthcare Providers: SeriousBroker.it  This test is not yet approved or cleared by the [place] FDA and has been authorized for detection and/or diagnosis of SARS-CoV-2 by FDA under an Emergency Use Authorization (EUA). This EUA will remain in effect (meaning this test can be used) for the duration of the COVID-19 declaration under Section 564(b)(1) of the Act, 21 U.S.C. section 360bbb-3(b)(1), unless the authorization is terminated or revoked.     Resp Syncytial Virus by PCR NEGATIVE NEGATIVE Final    Comment: (NOTE) Fact Sheet for Patients: BloggerCourse.com  Fact Sheet for Healthcare Providers: SeriousBroker.it  This test is not yet approved or cleared by the [place] FDA and has been authorized for detection and/or diagnosis of SARS-CoV-2 by FDA under an Emergency Use Authorization (EUA). This EUA will remain in effect (meaning this test can be used) for the duration of the COVID-19 declaration under Section 564(b)(1) of the Act, 21 U.S.C. section 360bbb-3(b)(1), unless the authorization is terminated or revoked.  Performed at [facility], [address] [place], [place] [address]   Blood Culture (routine x 2)     Status: None (Preliminary result)   Collection Time: 08/31/22 11:54 AM   Specimen: BLOOD RIGHT FOREARM   Result Value Ref Range Status   Specimen Description BLOOD RIGHT FOREARM  Final   Special Requests   Final    BOTTLES DRAWN AEROBIC AND ANAEROBIC Blood Culture results may not be optimal due to an excessive volume of blood received in culture bottles   Culture   Final    NO GROWTH 3 DAYS Performed at [facility], [address] [place], [place] [address]    Report Status PENDING  Incomplete  MRSA Next Gen by PCR, Nasal  Status: None   Collection Time: 09/01/22  8:18 AM   Specimen: Nasal Mucosa; Nasal Swab  Result Value Ref Range Status   MRSA by PCR Next Gen NOT DETECTED NOT DETECTED Final    Comment: (NOTE) The GeneXpert MRSA Assay (FDA approved for NASAL specimens only), is one component of a comprehensive MRSA colonization surveillance program. It is not intended to diagnose MRSA infection nor to guide or monitor treatment for MRSA infections. Test performance is not FDA approved in patients less than 79 years old. Performed at [facility], [address] [place], [place] [address]   Expectorated Sputum Assessment w Gram Stain, Rflx to Resp Cult     Status: None   Collection Time: 09/01/22  8:19 AM   Specimen: Expectorated Sputum  Result Value Ref Range Status   Specimen Description EXPECTORATED SPUTUM  Final   Special Requests NONE  Final   Sputum evaluation   Final    Sputum specimen not acceptable for testing.  Please recollect.   Performed at [facility], [address], [place], [place] [address]    Report Status 09/01/2022 FINAL  Final      Radiology Studies: No results found.    Scheduled Meds:  allopurinol  100 mg Oral Q supper   arformoterol  15 mcg Nebulization BID   And   umeclidinium bromide  1 puff Inhalation Daily   atorvastatin  80 mg Oral Q supper   azithromycin  500 mg Oral Daily   budesonide  0.5 mg Nebulization BID   clopidogrel  75 mg Oral Daily   cyproheptadine  12 mg Oral QHS   docusate sodium  100 mg Oral BID    famotidine  20 mg Oral QHS   feeding supplement  237 mL Oral BID BM   gabapentin  300 mg Oral QHS   guaiFENesin  600 mg Oral BID   insulin aspart  0-5 Units Subcutaneous QHS   insulin aspart  0-9 Units Subcutaneous TID WC   isosorbide-hydrALAZINE  1 tablet Oral BID   loratadine  10 mg Oral QHS   mirtazapine  30 mg Oral QHS   montelukast  10 mg Oral QPC supper   multivitamin with minerals  1 tablet Oral Daily   pantoprazole  40 mg Oral Daily   polyvinyl alcohol  1 drop Both Eyes BID   predniSONE  2 mg Oral q AM   predniSONE  5 mg Oral q AM   rivaroxaban  10 mg Oral Q supper   sodium chloride flush  3 mL Intravenous Q12H   Continuous Infusions:  cefTRIAXone (ROCEPHIN)  IV 2 g (09/02/22 1237)     LOS: 3 days   Time spent: 25 minutes   Noralee Stain, DO [facility] 09/03/2022, 12:52 PM   Available via Epic secure chat 7am-7pm After these hours, please refer to coverage provider listed on [email or web address]

## 2022-09-03 NOTE — Plan of Care (Signed)

## 2022-09-03 NOTE — Progress Notes (Signed)
Physical Therapy Treatment Patient Details Name: Ebony Scott MRN: [id number] DOB: 1950/09/27 Today's Date: 09/03/2022   History of Present Illness 72 y.o. female admitted 7/12 with fever, presenting with fatigue and poor PO intake following AICD insertion Monday (7/8). Past medical history significant of DVT, remote breast cancer, bronchiectasis, chronic systolic CHF, fibromyalgia, GAVE, HLD, hypothyroidism, and RA.    PT Comments  Pt making good progress with mobility and able to amb in hallway with supervision. Continue to recommend return home with HHPT.      Assistance Recommended at Discharge Intermittent Supervision/Assistance  If plan is discharge home, recommend the following:  Can travel by private vehicle    A little help with walking and/or transfers;A little help with bathing/dressing/bathroom;Assistance with cooking/housework;Direct supervision/assist for medications management;Direct supervision/assist for financial management;Assist for transportation;Help with stairs or ramp for entrance      Equipment Recommendations  None recommended by PT    Recommendations for Other Services       Precautions / Restrictions Precautions Precautions: ICD/Pacemaker Restrictions Weight Bearing Restrictions: No     Mobility  Bed Mobility Overal bed mobility: Modified Independent Bed Mobility: Supine to Sit, Sit to Supine     Supine to sit: Modified independent (Device/Increase time) Sit to supine: Modified independent (Device/Increase time)        Transfers Overall transfer level: Needs assistance Equipment used: Rollator (4 wheels) Transfers: Sit to/from Stand Sit to Stand: Supervision                Ambulation/Gait Ambulation/Gait assistance: Supervision Gait Distance (Feet): 200 Feet Assistive device: Rollator (4 wheels) Gait Pattern/deviations: Step-through pattern, Decreased stride length, Decreased stance time - left, Antalgic Gait velocity:  decr Gait velocity interpretation: 1.31 - 2.62 ft/sec, indicative of limited community ambulator   General Gait Details: Assist for safety. Pt with severely arthritic lt knee making gait abnormal   Stairs             Wheelchair Mobility     Tilt Bed    Modified Rankin (Stroke Patients Only)       Balance Overall balance assessment: Needs assistance Sitting-balance support: No upper extremity supported, Feet supported Sitting balance-Leahy Scale: Good     Standing balance support: Single extremity supported Standing balance-Leahy Scale: Poor Standing balance comment: UE support                            Cognition Arousal/Alertness: Awake/alert Behavior During Therapy: WFL for tasks assessed/performed Overall Cognitive Status: Within Functional Limits for tasks assessed                                          Exercises      General Comments        Pertinent Vitals/Pain Pain Assessment Pain Assessment: No/denies pain    Home Living                          Prior Function            PT Goals (current goals can now be found in the care plan section) Acute Rehab PT Goals Patient Stated Goal: Feel better Progress towards PT goals: Progressing toward goals    Frequency    Min 1X/week      PT Plan Current plan remains appropriate  Co-evaluation              AM-PAC PT "6 Clicks" Mobility   Outcome Measure  Help needed turning from your back to your side while in a flat bed without using bedrails?: None Help needed moving from lying on your back to sitting on the side of a flat bed without using bedrails?: None Help needed moving to and from a bed to a chair (including a wheelchair)?: A Little Help needed standing up from a chair using your arms (e.g., wheelchair or bedside chair)?: A Little Help needed to walk in hospital room?: A Little Help needed climbing 3-5 steps with a railing? : A Lot 6  Click Score: 19    End of Session   Activity Tolerance: Patient tolerated treatment well Patient left: in bed;with call bell/phone within reach;with bed alarm set Nurse Communication: Mobility status PT Visit Diagnosis: Unsteadiness on feet (R26.81);Other abnormalities of gait and mobility (R26.89);Muscle weakness (generalized) (M62.81);Difficulty in walking, not elsewhere classified (R26.2);Pain Pain - Right/Left: Left Pain - part of body: Knee     Time: 1308-6578 PT Time Calculation (min) (ACUTE ONLY): 14 min  Charges:    $Gait Training: 8-22 mins PT General Charges $$ ACUTE PT VISIT: 1 Visit                     [facility] PT Acute Rehabilitation Services Office [phone]    Angelina Ok [facility] 09/03/2022, 6:52 PM

## 2022-09-04 DIAGNOSIS — R509 Fever, unspecified: Secondary | ICD-10-CM | POA: Diagnosis not present

## 2022-09-04 LAB — BASIC METABOLIC PANEL
Anion gap: 11 (ref 5–15)
BUN: 25 mg/dL — ABNORMAL HIGH (ref 8–23)
CO2: 20 mmol/L — ABNORMAL LOW (ref 22–32)
Calcium: 9 mg/dL (ref 8.9–10.3)
Chloride: 108 mmol/L (ref 98–111)
Creatinine, Ser: 1.3 mg/dL — ABNORMAL HIGH (ref 0.44–1.00)
GFR, Estimated: 43 mL/min — ABNORMAL LOW (ref >60)
Glucose, Bld: 91 mg/dL (ref 70–99)
Potassium: 3.8 mmol/L (ref 3.5–5.1)
Sodium: 139 mmol/L (ref 135–145)

## 2022-09-04 LAB — CBC
HCT: 27.7 % — ABNORMAL LOW (ref 36.0–46.0)
Hemoglobin: 8.8 g/dL — ABNORMAL LOW (ref 12.0–15.0)
MCH: 34.1 pg — ABNORMAL HIGH (ref 26.0–34.0)
MCHC: 31.8 g/dL (ref 30.0–36.0)
MCV: 107.4 fL — ABNORMAL HIGH (ref 80.0–100.0)
Platelets: 182 10*3/uL (ref 150–400)
RBC: 2.58 MIL/uL — ABNORMAL LOW (ref 3.87–5.11)
RDW: 14.1 % (ref 11.5–15.5)
WBC: 9.7 10*3/uL (ref 4.0–10.5)
nRBC: 0 % (ref 0.0–0.2)

## 2022-09-04 LAB — GLUCOSE, CAPILLARY: Glucose-Capillary: 79 mg/dL (ref 70–99)

## 2022-09-04 IMAGING — CR DG CHEST 2V
3 series · 3 of 3 positions shown · non-contrast
Comparison: CT chest dated 08/28/2019

CLINICAL DATA: Shortness of breath

EXAM:
CHEST - 2 VIEW

[w chest pa]
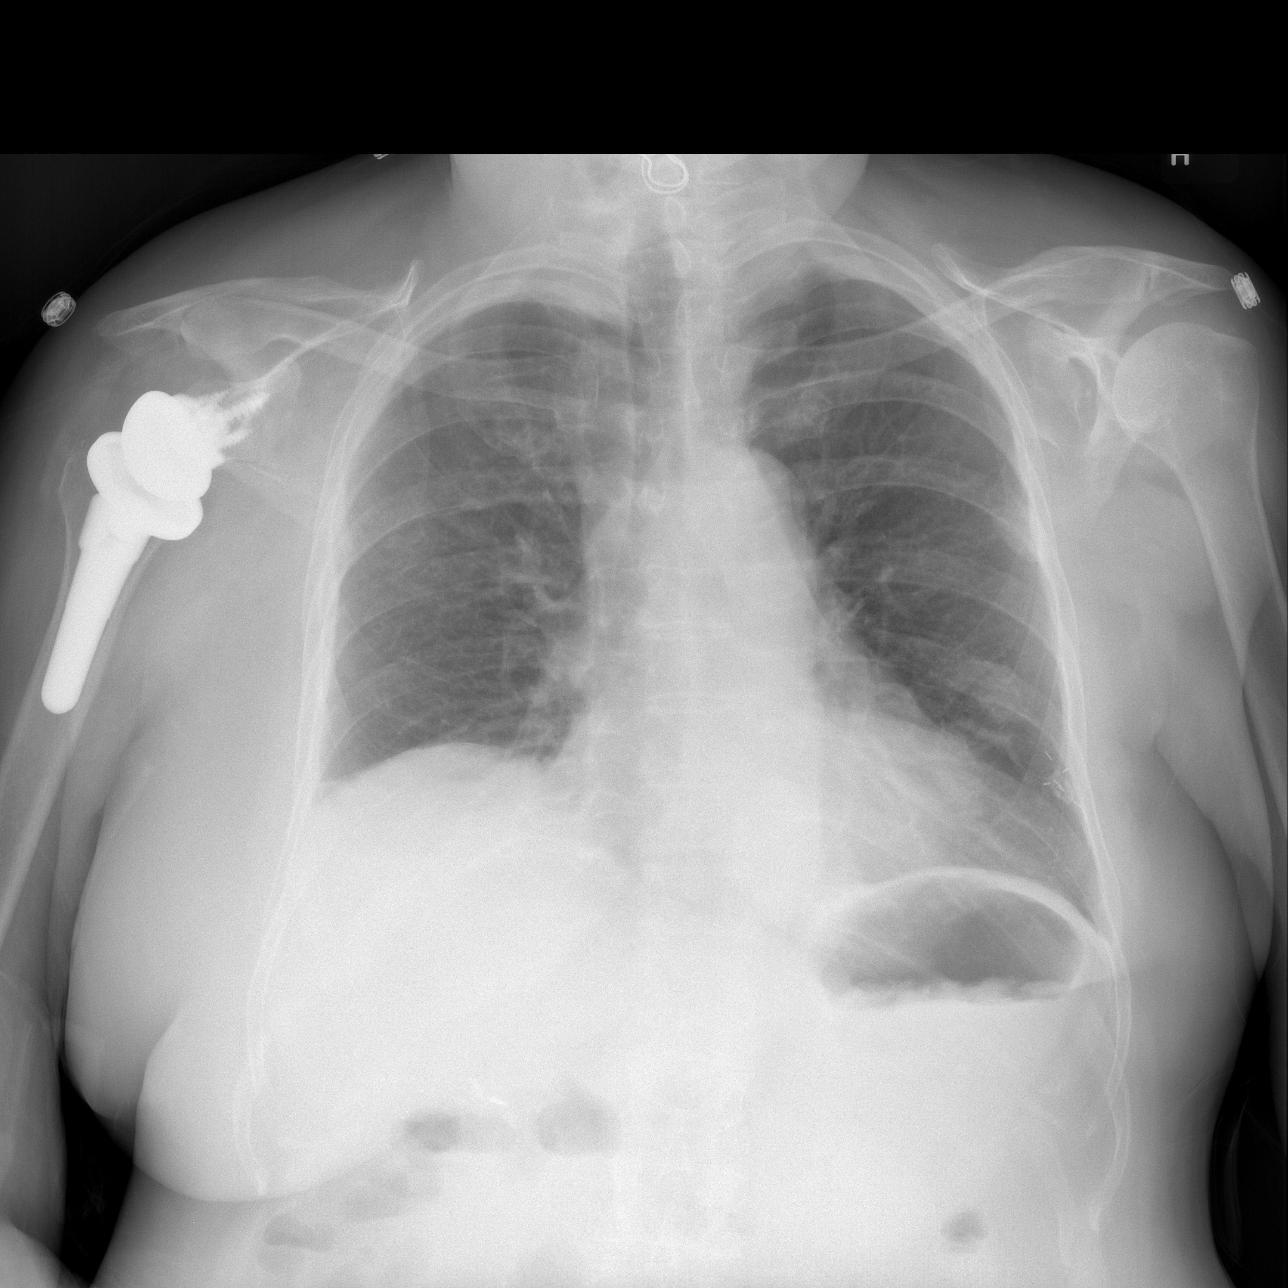

[w chest lat (1 of 2)]
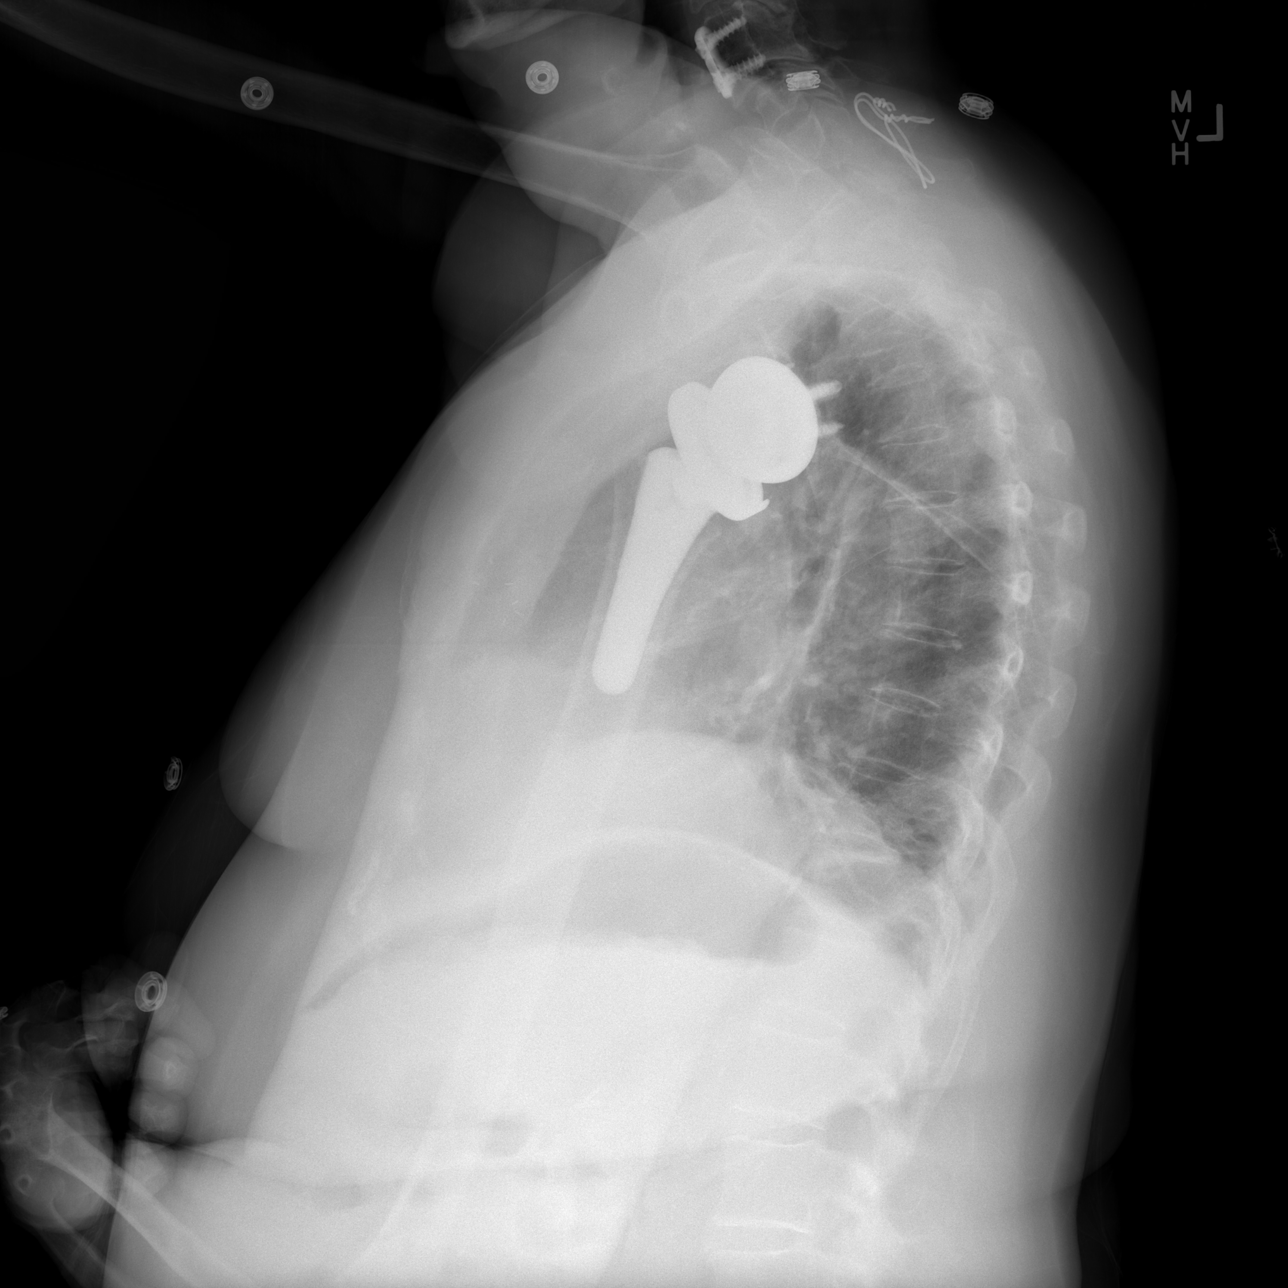

[w chest lat (2 of 2)]
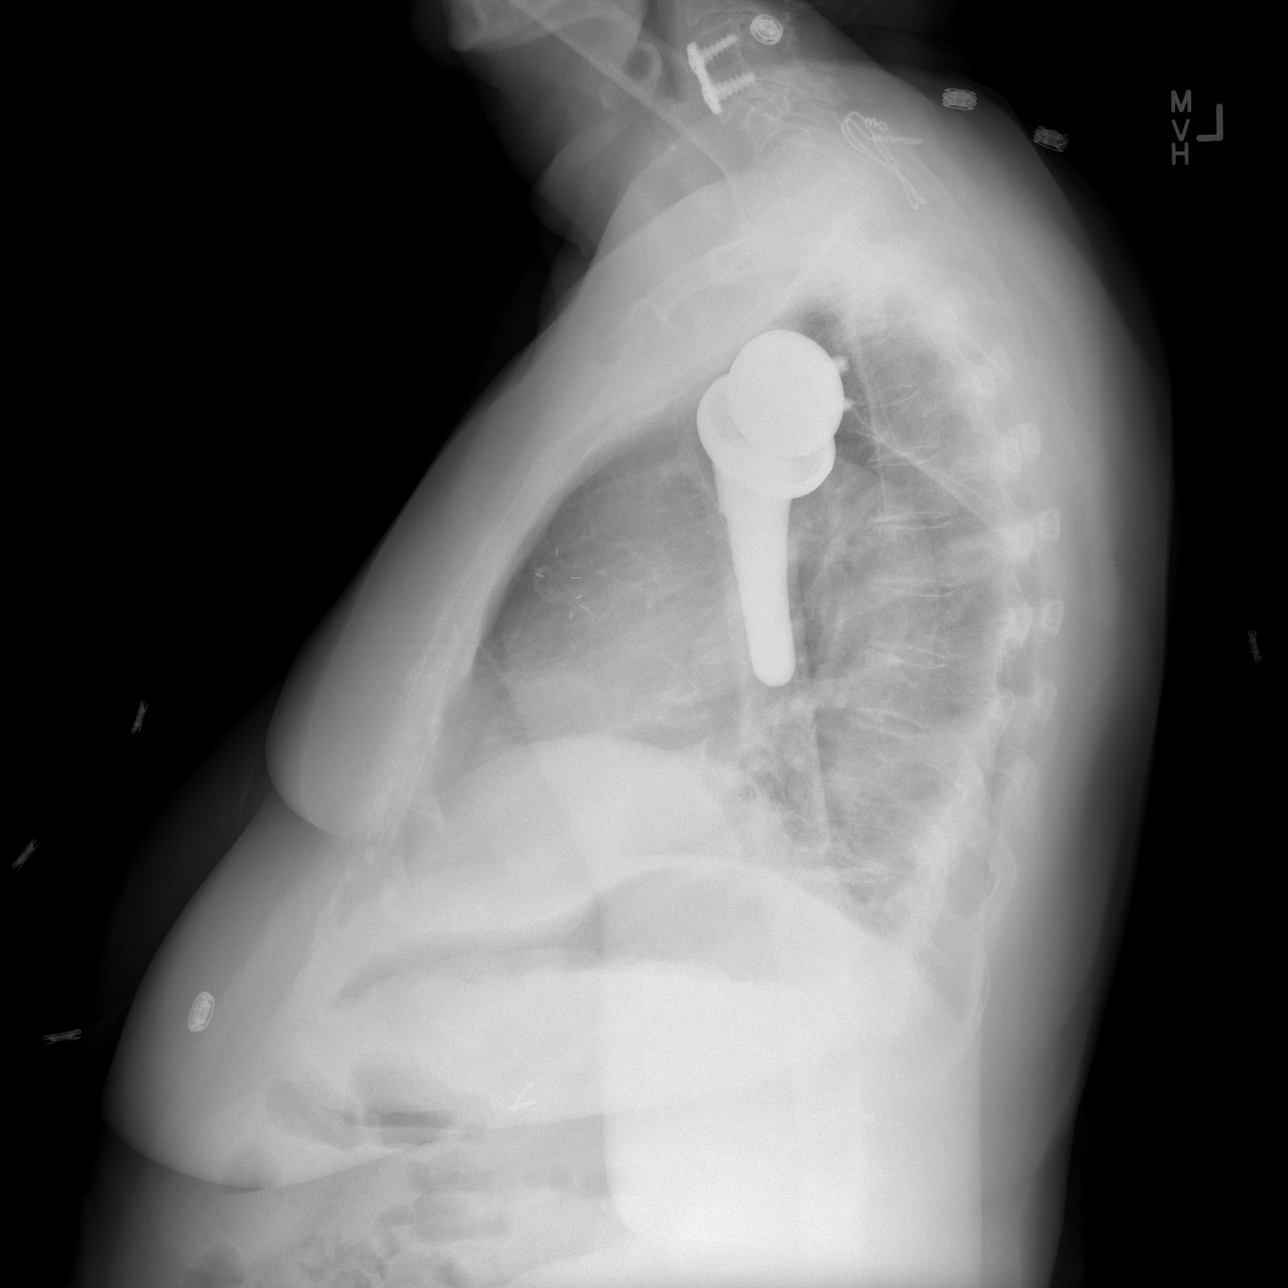

[3 of 3 positions shown; findings below may reference images not displayed]

FINDINGS: Mild right basilar atelectasis. Left lung is clear. No pleural
effusion or pneumothorax.

Mild eventration of the right hemidiaphragm.

Heart is normal in size.

Right shoulder arthroplasty. Healing left posterolateral 6th and 8th
rib fractures.

Postprocedural changes in the left breast.

Cholecystectomy clips.
IMPRESSION: Mild right basilar atelectasis.

Healing left rib fractures.

## 2022-09-04 MED ORDER — CEFADROXIL 500 MG PO CAPS: 500.0000 mg | ORAL_CAPSULE | Freq: Two times a day (BID) | ORAL | 0 refills | Status: AC

## 2022-09-04 MED ORDER — AZITHROMYCIN 500 MG PO TABS: 500.0000 mg | ORAL_TABLET | Freq: Every day | ORAL | 0 refills | Status: AC

## 2022-09-04 NOTE — Care Management Important Message (Signed)
Important Message  Patient Details  Name: Ebony Scott MRN: [id number] Date of Birth: 14-Feb-1951   Medicare Important Message Given:  Yes     Renie Ora 09/04/2022, 8:33 AM

## 2022-09-04 NOTE — Plan of Care (Signed)
  Problem: Activity: Goal: Risk for activity intolerance will decrease Outcome: Progressing   Problem: Nutrition: Goal: Adequate nutrition will be maintained Outcome: Completed/Met   Problem: Coping: Goal: Level of anxiety will decrease Outcome: Progressing   Problem: Elimination: Goal: Will not experience complications related to bowel motility Outcome: Completed/Met   Problem: Pain Managment: Goal: General experience of comfort will improve Outcome: Completed/Met   Problem: Safety: Goal: Ability to remain free from injury will improve Outcome: Completed/Met   Problem: Skin Integrity: Goal: Risk for impaired skin integrity will decrease Outcome: Completed/Met

## 2022-09-04 NOTE — Progress Notes (Signed)
Discharge instructions reviewed with pt and her son.  Copy of instructions given to pt. Pt informed her scripts were sent to her pharmacy for pick up. Pt to be d/c'd via wheelchair with belongings, with her son.           To be escorted by hospital volunteer.

## 2022-09-04 NOTE — Discharge Summary (Signed)
Physician Discharge Summary  Ebony Scott:[id number] DOB: 21-Mar-1950 DOA: 08/31/2022  PCP: [name], MD  Admit date: 08/31/2022 Discharge date: 09/04/2022  Admitted From: Home Disposition:  Home with home health   Recommendations for Outpatient Follow-up:  Follow up with PCP  Follow up with Cardiology as scheduled 7/24   Discharge Condition: Stable CODE STATUS: DNR  Diet recommendation: Heart healthy   Brief/Interim Summary: Ebony Scott is a 72 y.o. female with medical history significant of DVT, remote breast cancer, bronchiectasis, chronic systolic CHF, fibromyalgia, GAVE, HLD, hypothyroidism, and RA presenting with fatigue and poor PO intake following AICD insertion Monday (7/8).   Since then, she has been lethargic with poor PO intake, "low grade fever" <100.4 at home.  She had 1 episode of bilious emesis a few days ago.  Chronic cough from bronchiectasis without overt change.  No frank urinary symptoms other than 1 episode of incontinence (has made very little urine).    Due to fever of 101.9, patient was started on empiric antibiotics to cover for HCAP.  Patient had clinical improvement.  She had another fever of 101.  However, she did not have any new symptoms indicating another source of infection.  Blood cultures remained negative.  She remained afebrile for 24 hours prior to discharge.  She remained stable on room air.  Leukocytosis resolved.  Creatinine remained stable.  She was discharged home on azithromycin and cefadroxil to complete treatment.  Discharge Diagnoses:   Principal Problem:   Fever Active Problems:   Coronary artery calcification seen on CAT scan   Chronic systolic CHF (congestive heart failure) (HCC)   Hyperlipidemia   Severe persistent asthma   Chronic kidney disease, stage 3b (HCC)   History of pulmonary embolus (PE)   DMII (diabetes mellitus, type 2) (HCC)   Acute kidney injury superimposed on chronic kidney disease (HCC)   Lethargy    Hypotension   DNR (do not resuscitate)   Fever, concern for HCAP -UA negative -COVID, influenza negative -MRSA PCR negative -Blood cultures negative to date -Strep pneumo antigen negative, sputum culture was not acceptable for testing -Chest x-ray reviewed independently, small opacity left lung base, could be atelectasis versus early pneumonia -Empiric antibiotics to treat possible HCAP, Rocephin and azithromycin, in setting of fever and immunocompromise status --> cefadroxil and azithromycin for discharge   AKI on CKD stage IIIb -In setting of Entresto and Jardiance use -Baseline creatinine 1.1-1.4 -Improved, creatinine 1.30   Chronic systolic CHF -EF <81 -Resume Entresto, Jardiance   Status post ICD -EP following -ICD insertion site without erythema or sign of infection   CAD Hyperlipidemia -Status post LAD DES in 12/2020 -Plavix, Lipitor, Bidil    Bronchiectasis, asthma -Continue breathing treatments and nebs   History of PE/DVT -Xarelto    Diabetes mellitus, with peripheral neuropathy -A1c 6.3 in April    Rheumatoid arthritis -On Hizentra, daily prednisone -Voltaren gel as needed      Discharge Instructions  Discharge Instructions     (HEART FAILURE PATIENTS) Call MD:  Anytime you have any of the following symptoms: 1) 3 pound weight gain in 24 hours or 5 pounds in 1 week 2) shortness of breath, with or without a dry hacking cough 3) swelling in the hands, feet or stomach 4) if you have to sleep on extra pillows at night in order to breathe.   Complete by: As directed    Call MD for:  difficulty breathing, headache or visual disturbances   Complete by:  As directed    Call MD for:  extreme fatigue   Complete by: As directed    Call MD for:  persistant dizziness or light-headedness   Complete by: As directed    Call MD for:  persistant nausea and vomiting   Complete by: As directed    Call MD for:  severe uncontrolled pain   Complete by: As directed     Call MD for:  temperature >100.4   Complete by: As directed    Diet - low sodium heart healthy   Complete by: As directed    Discharge instructions   Complete by: As directed    You were cared for by a hospitalist during your hospital stay. If you have any questions about your discharge medications or the care you received while you were in the hospital after you are discharged, you can call the unit and ask to speak with the hospitalist on call if the hospitalist that took care of you is not available. Once you are discharged, your primary care physician will handle any further medical issues. Please note that NO REFILLS for any discharge medications will be authorized once you are discharged, as it is imperative that you return to your primary care physician (or establish a relationship with a primary care physician if you do not have one) for your aftercare needs so that they can reassess your need for medications and monitor your lab values.   Increase activity slowly   Complete by: As directed       Allergies as of 09/04/2022       Reactions   Baclofen Anaphylaxis, Other (See Comments)   Altered mental status requiring a 3-day hospital stay- patient became unresponsive "Put me in a coma for five days", Altered mental status requiring a 3-day hospital stay- patient became unresponsive   Dust Mite Extract Shortness Of Breath, Other (See Comments)   "sneezing" (02/20/2012) too   Molds & Smuts Shortness Of Breath   Morphine And Codeine Hives, Itching   Other Shortness Of Breath, Other (See Comments)   Grass and weeds "sneezing; filled sinuses" (02/20/2012)   Penicillins Rash, Other (See Comments)   "welts" (02/20/2012) Tolerated Ancef 02/16/20   Rofecoxib Swelling, Other (See Comments)   Vioxx- feet swelling   Shellfish Allergy Anaphylaxis, Shortness Of Breath, Itching, Rash   Shrimp Extract Anaphylaxis, Other (See Comments)   ALL SHELLFISH   Tetracycline Hcl Nausea And Vomiting    Tetracycline Hcl Other (See Comments)   GI Intolerance   Xolair [omalizumab] Other (See Comments)   Caused Blood clot   Zoledronic Acid Other (See Comments)   Reclast- Fever, Put in hospital, dr said it was a reaction from a reaction  Fever, inability to walk, Put in hospital Fever, Put in hospital, dr said it was a reaction from a reaction , Other reaction(s): Unknown   Dilaudid [hydromorphone Hcl] Itching   Hydrocodone-acetaminophen Itching   Levofloxacin Other (See Comments)   GI upset Other Reaction(s): GI Intolerance, Other (See Comments) REACTION: GI upset, GI upset   Oxycodone Hcl Nausea And Vomiting   Oxycodone Hcl Other (See Comments)   GI Intolerance   Paroxetine Nausea And Vomiting, Other (See Comments)   GI Intolerance also   Celecoxib Swelling, Other (See Comments)   Feet swelling   Diltiazem Hcl Swelling   Lactose Other (See Comments)   Bloating and gas   Lactose Intolerance (gi) Other (See Comments)   Bloating and gas   Oxycodone-acetaminophen Itching  Rituximab Other (See Comments)   Anemia    Tree Extract Other (See Comments)   "tested and told I was allergic to it; never experienced a reaction to it" (02/20/2012)   Gamunex [immune Globulin] Itching, Rash   Tolerates hizentra    Penicillin G Procaine Rash, Other (See Comments)   Welts, also        Medication List     TAKE these medications    acetaminophen 500 MG tablet Commonly known as: TYLENOL Take 500 mg by mouth as needed for moderate pain or fever.   acetic acid-hydrocortisone OTIC solution Commonly known as: VOSOL-HC Place 4 drops into both ears See admin instructions. Chloramp/sulfu/ampho/ampho/B/H drop 100-100-5 mg Instill 4 drops into both ears the first 5 days of the month, then as directed/as needed for itching   albuterol 108 (90 Base) MCG/ACT inhaler Commonly known as: VENTOLIN HFA Inhale 2 puffs into the lungs every 6 (six) hours as needed for wheezing or shortness of breath.    Align 4 MG Caps Take 4 mg by mouth daily.   allopurinol 100 MG tablet Commonly known as: ZYLOPRIM TAKE 1 TABLET BY MOUTH DAILY What changed: when to take this   Alpha-Lipoic Acid 600 MG Tabs Take 600 mg by mouth daily.   atorvastatin 80 MG tablet Commonly known as: LIPITOR TAKE 1 TABLET BY MOUTH DAILY What changed: when to take this   azelastine 0.1 % nasal spray Commonly known as: ASTELIN Place 2 sprays into both nostrils daily. Use in each nostril as directed What changed:  when to take this reasons to take this   azithromycin 500 MG tablet Commonly known as: ZITHROMAX Take 1 tablet (500 mg total) by mouth daily for 2 days.   BEANO PO Take 2-3 tablets by mouth 3 (three) times daily as needed (Gas).   benzonatate 200 MG capsule Commonly known as: TESSALON TAKE 1 CAPSULE BY MOUTH THREE TIMES DAILY AS NEEDED FOR COUGH What changed: See the new instructions.   budesonide 0.5 MG/2ML nebulizer solution Commonly known as: Pulmicort Take 2 mLs (0.5 mg total) by nebulization 2 (two) times daily.   cefadroxil 500 MG capsule Commonly known as: DURICEF Take 1 capsule (500 mg total) by mouth 2 (two) times daily for 2 days.   cetirizine 10 MG tablet Commonly known as: ZYRTEC Take 1 tablet (10 mg total) by mouth 2 (two) times daily as needed for allergies (Can take a n extra dose during flare ups.). What changed: when to take this   CITRACAL PO Take 1 tablet by mouth daily. With magnesium   clopidogrel 75 MG tablet Commonly known as: PLAVIX Take 1 tablet (75 mg total) by mouth daily.   cyproheptadine 4 MG tablet Commonly known as: PERIACTIN Take 1 tablet (4 mg total) by mouth 3 (three) times daily as needed for allergies. What changed:  how much to take when to take this   denosumab 60 MG/ML Sosy injection Commonly known as: PROLIA Inject 60 mg into the skin every 6 (six) months.   Dexilant 60 MG capsule Generic drug: dexlansoprazole Take 60 mg by mouth  daily.   empagliflozin 10 MG Tabs tablet Commonly known as: Jardiance Take 1 tablet (10 mg total) by mouth daily before breakfast.   EPINEPHrine 0.3 mg/0.3 mL Soaj injection Commonly known as: EPI-PEN USE AS DIRECTED BY YOUR PHYSICIAN INTRAMUSCULARLY AS NEEDED FOR ANAPHYLAXIS What changed:  how much to take how to take this when to take this reasons to take this additional instructions  famotidine 20 MG tablet Commonly known as: PEPCID Take 1 tablet (20 mg total) by mouth daily. TAKE 1 TABLET BY MOUTH EACH NIGHT AT BEDTIME Strength: 20 mg   folic acid 1 MG tablet Commonly known as: FOLVITE Take 1 tablet (1 mg total) by mouth daily.   furosemide 40 MG tablet Commonly known as: LASIX DAILY AS NEEDED FOR WEIGHT GAIN OF 2 POUNDS OVERNIGHT OR 5 POUNDS IN ONE WEEK   gabapentin 300 MG capsule Commonly known as: NEURONTIN Take 1 capsule (300 mg total) by mouth at bedtime. What changed: additional instructions   Gerhardt's butt cream Crea Apply topically 3 times a day as needed for itching or irritation   GoodSense Arthritis Pain 1 % Gel Generic drug: diclofenac Sodium Apply 1 Application topically 4 times daily as needed for pain. What changed: See the new instructions.   guaiFENesin 600 MG 12 hr tablet Commonly known as: MUCINEX Take 600 mg by mouth 2 (two) times daily.   Hizentra 10 GM/50ML Soln Generic drug: Immune Globulin (Human) Inject 65 mLs into the skin once a week. 13 g Infusion   ipratropium-albuterol 0.5-2.5 (3) MG/3ML Soln Commonly known as: DUONEB Take 3 mLs by nebulization every 6 (six) hours as needed.   isosorbide-hydrALAZINE 20-37.5 MG tablet Commonly known as: BIDIL Take 1 tablet by mouth 2 (two) times daily.   LACTOSE FAST ACTING RELIEF PO Take 3 tablets by mouth 3 (three) times daily before meals. For dairy food or drink   lidocaine-prilocaine cream Commonly known as: EMLA Apply 1 application  topically as needed (pain).   magnesium oxide  400 (240 Mg) MG tablet Commonly known as: MAG-OX Take 400 mg by mouth at bedtime.   mirtazapine 30 MG disintegrating tablet Commonly known as: REMERON SOL-TAB Take 1 tablet (30 mg total) by mouth at bedtime as needed (sleep). What changed: when to take this   montelukast 10 MG tablet Commonly known as: SINGULAIR Take 1 tablet (10 mg total) by mouth daily after supper.   mupirocin ointment 2 % Commonly known as: BACTROBAN Apply 1 Application topically at bedtime.   nystatin 100000 UNIT/ML suspension Commonly known as: MYCOSTATIN Use as directed 15 mLs in the mouth or throat at bedtime as needed (if has yeast in mouth).   nystatin-triamcinolone ointment Commonly known as: MYCOLOG Apply 1 Application topically daily as needed (Vaginal irritation).   predniSONE 5 MG tablet Commonly known as: DELTASONE Take 1 tablet (5 mg total) by mouth daily with breakfast. What changed:  when to take this additional instructions   predniSONE 1 MG tablet Commonly known as: DELTASONE Take 2 tablets (2 mg total) by mouth daily with breakfast. Take along with 5 mg tablet daily. What changed:  when to take this additional instructions   PRENATAL VITAMINS PO Take 1 tablet by mouth daily.   sacubitril-valsartan 24-26 MG Commonly known as: ENTRESTO Take 1 tablet by mouth 2 (two) times daily.   Stiolto Respimat 2.5-2.5 MCG/ACT Aers Generic drug: Tiotropium Bromide-Olodaterol Inhale 2 puffs into the lungs daily. What changed: when to take this   Systane Complete 0.6 % Soln Generic drug: Propylene Glycol Place 1 drop into both eyes 2 (two) times daily.   traMADol 50 MG tablet Commonly known as: ULTRAM Take 1 tablet (50 mg total) by mouth at bedtime.   triamcinolone 0.1 % cream : eucerin Crea Apply 1 Application topically 2 (two) times daily. What changed:  when to take this reasons to take this   triamcinolone ointment 0.5 %  Commonly known as: KENALOG Apply 1 Application  topically 2 (two) times daily.   Vitamin D3 25 MCG (1000 UT) Caps Take 1,000 Units by mouth at bedtime.   Xarelto 20 MG Tabs tablet Generic drug: rivaroxaban TAKE 1 TABLET BY MOUTH DAILY WITH SUPPER What changed: how much to take        Follow-up Information     [name], MD Follow up.   Specialty: Family Medicine Contact information: [address] [place] [address] [phone]                Allergies  Allergen Reactions   Baclofen Anaphylaxis and Other (See Comments)    Altered mental status requiring a 3-day hospital stay- patient became unresponsive   "Put me in a coma for five days", Altered mental status requiring a 3-day hospital stay- patient became unresponsive   Dust Mite Extract Shortness Of Breath and Other (See Comments)    "sneezing" (02/20/2012) too   Molds & Smuts Shortness Of Breath   Morphine And Codeine Hives and Itching   Other Shortness Of Breath and Other (See Comments)    Grass and weeds "sneezing; filled sinuses" (02/20/2012)   Penicillins Rash and Other (See Comments)    "welts" (02/20/2012)  Tolerated Ancef 02/16/20   Rofecoxib Swelling and Other (See Comments)    Vioxx- feet swelling   Shellfish Allergy Anaphylaxis, Shortness Of Breath, Itching and Rash   Shrimp Extract Anaphylaxis and Other (See Comments)    ALL SHELLFISH   Tetracycline Hcl Nausea And Vomiting   Tetracycline Hcl Other (See Comments)    GI Intolerance   Xolair [Omalizumab] Other (See Comments)    Caused Blood clot   Zoledronic Acid Other (See Comments)    Reclast- Fever, Put in hospital, dr said it was a reaction from a reaction    Fever, inability to walk, Put in hospital  Fever, Put in hospital, dr said it was a reaction from a reaction , Other reaction(s): Unknown   Dilaudid [Hydromorphone Hcl] Itching   Hydrocodone-Acetaminophen Itching   Levofloxacin Other (See Comments)    GI upset  Other Reaction(s): GI Intolerance, Other (See  Comments)  REACTION: GI upset, GI upset   Oxycodone Hcl Nausea And Vomiting   Oxycodone Hcl Other (See Comments)    GI Intolerance   Paroxetine Nausea And Vomiting and Other (See Comments)    GI Intolerance also   Celecoxib Swelling and Other (See Comments)    Feet swelling   Diltiazem Hcl Swelling   Lactose Other (See Comments)    Bloating and gas   Lactose Intolerance (Gi) Other (See Comments)    Bloating and gas   Oxycodone-Acetaminophen Itching   Rituximab Other (See Comments)    Anemia    Tree Extract Other (See Comments)    "tested and told I was allergic to it; never experienced a reaction to it" (02/20/2012)   Gamunex [Immune Globulin] Itching and Rash    Tolerates hizentra    Penicillin G Procaine Rash and Other (See Comments)    Welts, also    Consultations: Cardiology    Procedures/Studies: DG Chest Port 1 View  Result Date: 08/31/2022 CLINICAL DATA:  Provided history: Questionable sepsis-evaluate for abnormality. EXAM: PORTABLE CHEST 1 VIEW COMPARISON:  Prior chest radiograph 08/27/2022 and earlier. FINDINGS: Left chest single-lead implantable cardiac device. Cardiomegaly. A nipple shadow projects over the left lung base. Small linear/bandlike opacities within the left lung base. Mild chronic elevation of the right  hemidiaphragm. No appreciable airspace consolidation on the right. No evidence of pleural effusion or pneumothorax. No acute osseous abnormality identified. Incompletely imaged ACDF hardware. Metallic cerclage wires at the level of the cervicothoracic junction. Prior right shoulder arthroplasty. IMPRESSION: 1. Small linear/bandlike opacities within the left lung base. The appearance favors atelectasis. However, pneumonia is difficult to definitively exclude. 2. Cardiomegaly. Electronically Signed   By: Jackey Loge D.O.   On: 08/31/2022 12:18   DG Chest 2 View  Result Date: 08/27/2022 CLINICAL DATA:  Cardiac device in situ. EXAM: CHEST - 2 VIEW COMPARISON:   April 30, 2022. FINDINGS: Stable cardiomediastinal silhouette. Interval placement of single lead left-sided defibrillator with lead in grossly good position. No pneumothorax is noted. Both lungs are clear. The visualized skeletal structures are unremarkable. IMPRESSION: Interval placement of single lead left-sided defibrillator. Electronically Signed   By: Lupita Raider M.D.   On: 08/27/2022 17:32   EP PPM/ICD IMPLANT  Result Date: 08/27/2022 Table formatting from the original result was not included.  SURGEON:  York Pellant, MD    PREPROCEDURE DIAGNOSES:  1. CHFrEF    POSTPROCEDURE DIAGNOSES:  1. CHFrEF    PROCEDURES:   1. Subcutaneous ICD placement    INTRODUCTION: SOUNDRA LAMPLEY is a 72 y.o. patient who presents to the EP lab for dual chamber permanent pacemaker implantation.    DESCRIPTION OF PROCEDURE:  Informed written consent was obtained and the patient was brought to the electrophysiology lab in the fasting state. The patient was adequately sedated with intravenous Versed, and fentanyl as outlined in the nursing report.  The patient's left chest was prepped and draped in the usual sterile fashion by the EP lab staff.  The skin overlying the left deltopectoral region was infiltrated with lidocaine for local analgesia.  An incision was created over the left deltopectoral region.  A left subcutaneous pocket was fashioned using a combination of sharp and blunt dissection immediately superficial to the pectoral fascia.  Electrocautery was used to assure hemostasis. Lead Placement: The left axillary vein was cannulated using modified seldinger techniques.  Through the left axillary vein, an RV ICD lead was advanced to the RV apex. The lead was secured to the pectoral fascia.Lead parameters are detailed below.  RV Model [id number] Serial # [id number] Amplitude 13.4 mV Threshold 1.0 V @ 0.5 S Impedence 656 ohms The pocket was irrigated with copious gentamicin solution.  The leads were then  connected to a pulse  generator (model Hitchcock VR, serial [id number]). The pocket was closed in layers of absorbable suture. EBL < 10mL. Steri-strips and a sterile dressing were applied.  During this procedure the patient is administered Versed and Fentanyl by a trained provider under my direct supervision to achieve and maintain moderate conscious sedation.  The patient's heart rate, blood pressure, and oxygen saturation are monitored continuously during the procedure. The period of conscious sedation is 35 minutes, of which I was present face-to-face 100% of this time.    CONCLUSIONS:  1. Successful single chamber ICD  3.  No early apparent complications. York Pellant, MD Cardiac Electrophysiology   CT SHOULDER LEFT WO CONTRAST  Result Date: 08/17/2022 CLINICAL DATA:  Shoulder pain EXAM: CT OF THE UPPER LEFT EXTREMITY WITHOUT CONTRAST TECHNIQUE: Multidetector CT imaging of the upper left extremity was performed according to the standard protocol. RADIATION DOSE REDUCTION: This exam was performed according to the departmental dose-optimization program which includes automated exposure control, adjustment of the mA and/or kV according to patient size and/or use  of iterative reconstruction technique. COMPARISON:  X-ray 03/07/2022, MRI 07/28/2022 FINDINGS: Bones/Joint/Cartilage Subchondral fracture of the posteromedial aspect of the humeral head involving a 1.4 x 1.3 cm area of bone. No additional fracture. No dislocation. Severe glenohumeral joint osteoarthritis manifested by joint space narrowing, subchondral sclerosis, and marginal osteophyte formation. Small-moderate-sized glenohumeral joint effusion. Moderate degenerative changes of the acromioclavicular joint. Moderate-large volume subacromial-subdeltoid bursal fluid collection. Remaining visualized osseous structures appear grossly intact. Ligaments Suboptimally assessed by CT. Muscles and Tendons Rotator cuff tears better assessed on recent MRI. Associated rotator cuff muscle  atrophy. Soft tissues No soft tissue fluid collection or hematoma. No axillary lymphadenopathy. Visualized lung field is clear. Aortic and coronary artery atherosclerosis. IMPRESSION: 1. Subchondral fracture of the posteromedial aspect of the humeral head. 2. Severe glenohumeral joint osteoarthritis. 3. Moderate-large volume subacromial-subdeltoid bursal fluid collection. 4. Moderate degenerative changes of the acromioclavicular joint. 5. Aortic and coronary artery atherosclerosis (ICD10-I70.0). Electronically Signed   By: Duanne Guess D.O.   On: 08/17/2022 15:33      Discharge Exam: Vitals:   09/04/22 0743 09/04/22 0809  BP:  (!) 143/61  Pulse:    Resp:  16  Temp:  98.8 F (37.1 C)  SpO2: 96%     General: Pt is alert, awake, not in acute distress Cardiovascular: RRR, S1/S2 +, no edema Respiratory: CTA bilaterally, no wheezing, no rhonchi, no respiratory distress, no conversational dyspnea, on room air   Abdominal: Soft, NT, ND, bowel sounds + Extremities: no edema, no cyanosis Psych: Normal mood and affect, stable judgement and insight     The results of significant diagnostics from this hospitalization (including imaging, microbiology, ancillary and laboratory) are listed below for reference.     Microbiology: Recent Results (from the past 240 hour(s))  Blood Culture (routine x 2)     Status: None (Preliminary result)   Collection Time: 08/31/22 11:49 AM   Specimen: BLOOD RIGHT HAND  Result Value Ref Range Status   Specimen Description BLOOD RIGHT HAND  Final   Special Requests   Final    BOTTLES DRAWN AEROBIC AND ANAEROBIC Blood Culture adequate volume   Culture   Final    NO GROWTH 4 DAYS Performed at [facility], [address], [place], [place] [address]    Report Status PENDING  Incomplete  Resp panel by RT-PCR (RSV, Flu A&B, Covid) Anterior Nasal Swab     Status: None   Collection Time: 08/31/22 11:49 AM   Specimen: Anterior Nasal Swab  Result Value Ref  Range Status   SARS Coronavirus 2 by RT PCR NEGATIVE NEGATIVE Final   Influenza A by PCR NEGATIVE NEGATIVE Final   Influenza B by PCR NEGATIVE NEGATIVE Final    Comment: (NOTE) The Xpert Xpress SARS-CoV-2/FLU/RSV plus assay is intended as an aid in the diagnosis of influenza from Nasopharyngeal swab specimens and should not be used as a sole basis for treatment. Nasal washings and aspirates are unacceptable for Xpert Xpress SARS-CoV-2/FLU/RSV testing.  Fact Sheet for Patients: BloggerCourse.com  Fact Sheet for Healthcare Providers: SeriousBroker.it  This test is not yet approved or cleared by the [place] FDA and has been authorized for detection and/or diagnosis of SARS-CoV-2 by FDA under an Emergency Use Authorization (EUA). This EUA will remain in effect (meaning this test can be used) for the duration of the COVID-19 declaration under Section 564(b)(1) of the Act, 21 U.S.C. section 360bbb-3(b)(1), unless the authorization is terminated or revoked.     Resp Syncytial Virus by  PCR NEGATIVE NEGATIVE Final    Comment: (NOTE) Fact Sheet for Patients: BloggerCourse.com  Fact Sheet for Healthcare Providers: SeriousBroker.it  This test is not yet approved or cleared by the [place] FDA and has been authorized for detection and/or diagnosis of SARS-CoV-2 by FDA under an Emergency Use Authorization (EUA). This EUA will remain in effect (meaning this test can be used) for the duration of the COVID-19 declaration under Section 564(b)(1) of the Act, 21 U.S.C. section 360bbb-3(b)(1), unless the authorization is terminated or revoked.  Performed at [facility], [address] [place], [place] [address]   Blood Culture (routine x 2)     Status: None (Preliminary result)   Collection Time: 08/31/22 11:54 AM   Specimen: BLOOD RIGHT FOREARM  Result Value Ref Range  Status   Specimen Description BLOOD RIGHT FOREARM  Final   Special Requests   Final    BOTTLES DRAWN AEROBIC AND ANAEROBIC Blood Culture results may not be optimal due to an excessive volume of blood received in culture bottles   Culture   Final    NO GROWTH 4 DAYS Performed at [facility], [address] [place], [place] [address]    Report Status PENDING  Incomplete  MRSA Next Gen by PCR, Nasal     Status: None   Collection Time: 09/01/22  8:18 AM   Specimen: Nasal Mucosa; Nasal Swab  Result Value Ref Range Status   MRSA by PCR Next Gen NOT DETECTED NOT DETECTED Final    Comment: (NOTE) The GeneXpert MRSA Assay (FDA approved for NASAL specimens only), is one component of a comprehensive MRSA colonization surveillance program. It is not intended to diagnose MRSA infection nor to guide or monitor treatment for MRSA infections. Test performance is not FDA approved in patients less than 46 years old. Performed at [facility], [address] [place], [place] [address]   Expectorated Sputum Assessment w Gram Stain, Rflx to Resp Cult     Status: None   Collection Time: 09/01/22  8:19 AM   Specimen: Expectorated Sputum  Result Value Ref Range Status   Specimen Description EXPECTORATED SPUTUM  Final   Special Requests NONE  Final   Sputum evaluation   Final    Sputum specimen not acceptable for testing.  Please recollect.   Performed at [facility], [address], [place], [place] [address]    Report Status 09/01/2022 FINAL  Final     Labs: BNP (last 3 results) Recent Labs    04/25/22 1146 08/07/22 1430  BNP 1,049.7* 646.5*   Basic Metabolic Panel: Recent Labs  Lab 08/31/22 1149 09/01/22 0219 09/02/22 0147 09/03/22 0325 09/03/22 2358  NA 137 139 140 143 139  K 4.6 3.9 3.7 4.3 3.8  CL 107 110 110 106 108  CO2 21* 19* 20* 21* 20*  GLUCOSE 147* 96 93 86 91  BUN 36* 36* 32* 26* 25*  CREATININE 2.58* 1.82* 1.51* 1.33* 1.30*  CALCIUM 8.8* 8.7*  8.6* 9.1 9.0   Liver Function Tests: Recent Labs  Lab 08/31/22 1149  AST 37  ALT 24  ALKPHOS 49  BILITOT 0.5  PROT 6.5  ALBUMIN 2.7*   No results for input(s): "LIPASE", "AMYLASE" in the last 168 hours. No results for input(s): "AMMONIA" in the last 168 hours. CBC: Recent Labs  Lab 08/31/22 1149 09/01/22 0219 09/02/22 0147 09/03/22 0325 09/03/22 2358  WBC 13.2* 10.7* 11.4* 10.5 9.7  NEUTROABS 9.9*  --   --   --   --  HGB 10.1* 9.9* 9.1* 9.5* 8.8*  HCT 31.2* 30.9* 27.7* 29.1* 27.7*  MCV 110.2* 106.2* 104.5* 107.4* 107.4*  PLT 132* 142* 138* 157 182   Cardiac Enzymes: No results for input(s): "CKTOTAL", "CKMB", "CKMBINDEX", "TROPONINI" in the last 168 hours. BNP: Invalid input(s): "POCBNP" CBG: Recent Labs  Lab 09/03/22 0726 09/03/22 1149 09/03/22 1636 09/03/22 2117 09/04/22 0741  GLUCAP 85 120* 158* 131* 79   D-Dimer No results for input(s): "DDIMER" in the last 72 hours. Hgb A1c No results for input(s): "HGBA1C" in the last 72 hours. Lipid Profile No results for input(s): "CHOL", "HDL", "LDLCALC", "TRIG", "CHOLHDL", "LDLDIRECT" in the last 72 hours. Thyroid function studies No results for input(s): "TSH", "T4TOTAL", "T3FREE", "THYROIDAB" in the last 72 hours.  Invalid input(s): "FREET3" Anemia work up No results for input(s): "VITAMINB12", "FOLATE", "FERRITIN", "TIBC", "IRON", "RETICCTPCT" in the last 72 hours. Urinalysis    Component Value Date/Time   COLORURINE YELLOW 08/31/2022 1435   APPEARANCEUR CLEAR 08/31/2022 1435   LABSPEC 1.014 08/31/2022 1435   PHURINE 6.0 08/31/2022 1435   GLUCOSEU >=500 (A) 08/31/2022 1435   HGBUR NEGATIVE 08/31/2022 1435   BILIRUBINUR NEGATIVE 08/31/2022 1435   KETONESUR NEGATIVE 08/31/2022 1435   PROTEINUR NEGATIVE 08/31/2022 1435   UROBILINOGEN 0.2 05/17/2014 1113   NITRITE NEGATIVE 08/31/2022 1435   LEUKOCYTESUR NEGATIVE 08/31/2022 1435   Sepsis Labs Recent Labs  Lab 09/01/22 0219 09/02/22 0147  09/03/22 0325 09/03/22 2358  WBC 10.7* 11.4* 10.5 9.7   Microbiology Recent Results (from the past 240 hour(s))  Blood Culture (routine x 2)     Status: None (Preliminary result)   Collection Time: 08/31/22 11:49 AM   Specimen: BLOOD RIGHT HAND  Result Value Ref Range Status   Specimen Description BLOOD RIGHT HAND  Final   Special Requests   Final    BOTTLES DRAWN AEROBIC AND ANAEROBIC Blood Culture adequate volume   Culture   Final    NO GROWTH 4 DAYS Performed at [facility], [address], [place], [place] [address]    Report Status PENDING  Incomplete  Resp panel by RT-PCR (RSV, Flu A&B, Covid) Anterior Nasal Swab     Status: None   Collection Time: 08/31/22 11:49 AM   Specimen: Anterior Nasal Swab  Result Value Ref Range Status   SARS Coronavirus 2 by RT PCR NEGATIVE NEGATIVE Final   Influenza A by PCR NEGATIVE NEGATIVE Final   Influenza B by PCR NEGATIVE NEGATIVE Final    Comment: (NOTE) The Xpert Xpress SARS-CoV-2/FLU/RSV plus assay is intended as an aid in the diagnosis of influenza from Nasopharyngeal swab specimens and should not be used as a sole basis for treatment. Nasal washings and aspirates are unacceptable for Xpert Xpress SARS-CoV-2/FLU/RSV testing.  Fact Sheet for Patients: BloggerCourse.com  Fact Sheet for Healthcare Providers: SeriousBroker.it  This test is not yet approved or cleared by the [place] FDA and has been authorized for detection and/or diagnosis of SARS-CoV-2 by FDA under an Emergency Use Authorization (EUA). This EUA will remain in effect (meaning this test can be used) for the duration of the COVID-19 declaration under Section 564(b)(1) of the Act, 21 U.S.C. section 360bbb-3(b)(1), unless the authorization is terminated or revoked.     Resp Syncytial Virus by PCR NEGATIVE NEGATIVE Final    Comment: (NOTE) Fact Sheet for  Patients: BloggerCourse.com  Fact Sheet for Healthcare Providers: SeriousBroker.it  This test is not yet approved or cleared by the [place] and has been authorized  for detection and/or diagnosis of SARS-CoV-2 by FDA under an Emergency Use Authorization (EUA). This EUA will remain in effect (meaning this test can be used) for the duration of the COVID-19 declaration under Section 564(b)(1) of the Act, 21 U.S.C. section 360bbb-3(b)(1), unless the authorization is terminated or revoked.  Performed at [facility], [address], [place], [place] [address]   Blood Culture (routine x 2)     Status: None (Preliminary result)   Collection Time: 08/31/22 11:54 AM   Specimen: BLOOD RIGHT FOREARM  Result Value Ref Range Status   Specimen Description BLOOD RIGHT FOREARM  Final   Special Requests   Final    BOTTLES DRAWN AEROBIC AND ANAEROBIC Blood Culture results may not be optimal due to an excessive volume of blood received in culture bottles   Culture   Final    NO GROWTH 4 DAYS Performed at [facility], [address], [place], [place] [address]    Report Status PENDING  Incomplete  MRSA Next Gen by PCR, Nasal     Status: None   Collection Time: 09/01/22  8:18 AM   Specimen: Nasal Mucosa; Nasal Swab  Result Value Ref Range Status   MRSA by PCR Next Gen NOT DETECTED NOT DETECTED Final    Comment: (NOTE) The GeneXpert MRSA Assay (FDA approved for NASAL specimens only), is one component of a comprehensive MRSA colonization surveillance program. It is not intended to diagnose MRSA infection nor to guide or monitor treatment for MRSA infections. Test performance is not FDA approved in patients less than 33 years old. Performed at [facility], [address] [place], [place] [address]   Expectorated Sputum Assessment w Gram Stain, Rflx to Resp Cult     Status: None   Collection Time: 09/01/22  8:19 AM    Specimen: Expectorated Sputum  Result Value Ref Range Status   Specimen Description EXPECTORATED SPUTUM  Final   Special Requests NONE  Final   Sputum evaluation   Final    Sputum specimen not acceptable for testing.  Please recollect.   Performed at [facility], [address], [place], [place] [address]    Report Status 09/01/2022 FINAL  Final     Patient was seen and examined on the day of discharge and was found to be in stable condition. Time coordinating discharge: 25 minutes including assessment and coordination of care, as well as examination of the patient.   SIGNED:  Noralee Stain, DO [facility] 09/04/2022, 1:30 PM

## 2022-09-05 LAB — CULTURE, BLOOD (ROUTINE X 2)
Culture: NO GROWTH
Special Requests: ADEQUATE

## 2022-09-06 ENCOUNTER — Telehealth: Payer: Self-pay

## 2022-09-06 NOTE — Transitions of Care (Post Inpatient/ED Visit) (Signed)
09/06/2022  Name: Ebony Scott MRN: [id number] DOB: 1950/11/08  Today's TOC FU Call Status: Today's TOC FU Call Status:: Successful TOC FU Call Competed TOC FU Call Complete Date: 09/06/22  Transition Care Management Follow-up Telephone Call Date of Discharge: 09/04/22 Discharge Facility: [facility]) Type of Discharge: Inpatient Admission Primary Inpatient Discharge Diagnosis:: Sepsis How have you been since you were released from the hospital?: Better Any questions or concerns?: No  Items Reviewed: Did you receive and understand the discharge instructions provided?: Yes Medications obtained,verified, and reconciled?: Partial Review Completed Reason for Partial Mediation Review: Patient only wanted to discuss new (antibiotics) Any new allergies since your discharge?: No Dietary orders reviewed?: No Do you have support at home?: Yes People in Home: child(ren), adult Name of Support/Comfort Primary Source: [name]  Medications Reviewed Today: Medications Reviewed Today     Reviewed by Jodelle Gross, RN (Case Manager) on 09/06/22 at [phone]  Med List Status: <None>   Medication Order Taking? Sig Documenting Provider Last Dose Status Informant  acetaminophen (TYLENOL) 500 MG tablet [id number]  Take 500 mg by mouth as needed for moderate pain or fever. [provider]  Active Child, Pharmacy Records  acetic acid-hydrocortisone (VOSOL-HC) OTIC solution [id number]  Place 4 drops into both ears See admin instructions. Chloramp/sulfu/ampho/ampho/B/H drop 100-100-5 mg Instill 4 drops into both ears the first 5 days of the month, then as directed/as needed for itching [provider]  Active Child, Pharmacy Records  albuterol (VENTOLIN HFA) 108 (90 Base) MCG/ACT inhaler [id number]  Inhale 2 puffs into the lungs every 6 (six) hours as needed for wheezing or shortness of breath. Kozlow, [name], MD  Active Child, Pharmacy Records  allopurinol (ZYLOPRIM) 100 MG tablet [id number]   TAKE 1 TABLET BY MOUTH DAILY  Patient taking differently: Take 100 mg by mouth daily with supper.   [name], MD  Active Child, Pharmacy Records  Alpha-D-Galactosidase [facility] PO) [id number]  Take 2-3 tablets by mouth 3 (three) times daily as needed (Gas). [provider]  Active Child, Pharmacy Records  Alpha-Lipoic Acid 600 MG TABS [id number]  Take 600 mg by mouth daily. [provider]  Active Child, Pharmacy Records  atorvastatin (LIPITOR) 80 MG tablet [id number]  TAKE 1 TABLET BY MOUTH DAILY  Patient taking differently: Take 80 mg by mouth daily with supper.   [name], MD  Active Child, Pharmacy Records  azelastine (ASTELIN) 0.1 % nasal spray [id number]  Place 2 sprays into both nostrils daily. Use in each nostril as directed  Patient taking differently: Place 2 sprays into both nostrils at bedtime as needed for allergies or rhinitis. Use in each nostril as directed   Kozlow, [name], MD  Active Child, Pharmacy Records  azithromycin [facility]) 500 MG tablet [id number] Yes Take 1 tablet (500 mg total) by mouth daily for 2 days. Noralee Stain, DO Taking Active            Med Note Electa Sniff, [facility]   Thu Sep 06, 2022  9:16 AM) TOC only reviewed discharged antibiotics  benzonatate (TESSALON) 200 MG capsule [id number]  TAKE 1 CAPSULE BY MOUTH THREE TIMES DAILY AS NEEDED FOR COUGH  Patient taking differently: Take 200 mg by mouth 2 (two) times daily as needed for cough.   [name], MD  Active Child, Pharmacy Records  budesonide (PULMICORT) 0.5 MG/2ML nebulizer solution [id number]  Take 2 mLs (0.5 mg total) by nebulization 2 (two) times daily. Martina Sinner, MD  Active Child,  Pharmacy Records  Calcium Citrate (CITRACAL PO) [id number]  Take 1 tablet by mouth daily. With magnesium [provider]  Active Child, Pharmacy Records  cefadroxil (DURICEF) 500 MG capsule [id number] Yes Take 1 capsule (500 mg total) by mouth 2 (two) times daily for 2 days.  Noralee Stain, DO Taking Active   cetirizine (ZYRTEC) 10 MG tablet [id number]  Take 1 tablet (10 mg total) by mouth 2 (two) times daily as needed for allergies (Can take a n extra dose during flare ups.).  Patient taking differently: Take 10 mg by mouth at bedtime.   Kozlow, [name], MD  Active Child, Pharmacy Records  Cholecalciferol (VITAMIN D3) 25 MCG (1000 UT) capsule [id number]  Take 1,000 Units by mouth at bedtime. [provider]  Active Child, Pharmacy Records  clopidogrel (PLAVIX) 75 MG tablet [id number]  Take 1 tablet (75 mg total) by mouth daily. Jodelle Red, MD  Active Child, Pharmacy Records  cyproheptadine (PERIACTIN) 4 MG tablet [id number]  Take 1 tablet (4 mg total) by mouth 3 (three) times daily as needed for allergies.  Patient taking differently: Take 12 mg by mouth at bedtime.   Kozlow, [name], MD  Active Child, Pharmacy Records  denosumab (PROLIA) 60 MG/ML SOSY injection [id number]  Inject 60 mg into the skin every 6 (six) months. [provider]  Active Child, Pharmacy Records  dexlansoprazole (DEXILANT) 60 MG capsule [id number]  Take 60 mg by mouth daily. [provider]  Active Child, Pharmacy Records  empagliflozin (JARDIANCE) 10 MG TABS tablet [id number]  Take 1 tablet (10 mg total) by mouth daily before breakfast. [name], MD  Active Child, Pharmacy Records  EPINEPHrine 0.3 mg/0.3 mL IJ SOAJ injection [id number]  USE AS DIRECTED BY YOUR PHYSICIAN INTRAMUSCULARLY AS NEEDED FOR ANAPHYLAXIS  Patient taking differently: Inject 0.3 mg into the muscle as needed for anaphylaxis.   Jessica Priest, MD  Active Child, Pharmacy Records  famotidine (PEPCID) 20 MG tablet [id number]  Take 1 tablet (20 mg total) by mouth daily. TAKE 1 TABLET BY MOUTH EACH NIGHT AT BEDTIME Strength: 20 mg Kozlow, [name], MD  Active Child, Pharmacy Records  folic acid (FOLVITE) 1 MG tablet [id number]  Take 1 tablet (1 mg total) by mouth daily. [name], MD   Active Child, Pharmacy Records  furosemide (LASIX) 40 MG tablet [id number]  DAILY AS NEEDED FOR WEIGHT GAIN OF 2 POUNDS OVERNIGHT OR 5 POUNDS IN ONE WEEK Alver Sorrow, NP  Active Child, Pharmacy Records  gabapentin (NEURONTIN) 300 MG capsule [id number]  Take 1 capsule (300 mg total) by mouth at bedtime.  Patient taking differently: Take 300 mg by mouth at bedtime. Can take up to three time a say as needed   Lewie Chamber, MD  Active Child, Pharmacy Records  GOODSENSE ARTHRITIS PAIN 1 % GEL [id number]  Apply 1 Application topically 4 times daily as needed for pain.  Patient taking differently: Diclofenac   [name], MD  Active Child, Pharmacy Records  guaiFENesin (MUCINEX) 600 MG 12 hr tablet [id number]  Take 600 mg by mouth 2 (two) times daily. [provider]  Active Child, Pharmacy Records  HIZENTRA 10 GM/50ML SOLN [id number]  Inject 65 mLs into the skin once a week. 13 g Infusion [provider]  Active Child, Pharmacy Records           Med Note Lenoria Farrier   Tue Aug 21, 2022 12:14 PM) Given by a nurse  ipratropium-albuterol (DUONEB) 0.5-2.5 (3)  MG/3ML SOLN [id number]  Take 3 mLs by nebulization every 6 (six) hours as needed. Martina Sinner, MD  Active Child, Pharmacy Records  isosorbide-hydrALAZINE (BIDIL) 20-37.5 MG tablet [id number]  Take 1 tablet by mouth 2 (two) times daily. Alver Sorrow, NP  Active Child, Pharmacy Records  Lactase (LACTOSE FAST ACTING RELIEF PO) [id number]  Take 3 tablets by mouth 3 (three) times daily before meals. For dairy food or drink [provider]  Active Child, Pharmacy Records  lidocaine-prilocaine (EMLA) cream [id number]  Apply 1 application  topically as needed (pain). [provider]  Active Child, Pharmacy Records  magnesium oxide (MAG-OX) 400 (240 Mg) MG tablet [id number]  Take 400 mg by mouth at bedtime. [provider]  Active Child, Pharmacy Records  mirtazapine (REMERON SOL-TAB) 30 MG  disintegrating tablet [id number]  Take 1 tablet (30 mg total) by mouth at bedtime as needed (sleep).  Patient taking differently: Take 30 mg by mouth at bedtime.   [name], MD  Active Child, Pharmacy Records  montelukast (SINGULAIR) 10 MG tablet [id number]  Take 1 tablet (10 mg total) by mouth daily after supper. [name], MD  Active Child, Pharmacy Records  mupirocin ointment (BACTROBAN) 2 % [id number]  Apply 1 Application topically at bedtime. [provider]  Active Child, Pharmacy Records  Nystatin (GERHARDT'S BUTT CREAM) CREA [id number]  Apply topically 3 times a day as needed for itching or irritation [name], MD  Active Child, Pharmacy Records  nystatin (MYCOSTATIN) 100000 UNIT/ML suspension [id number]  Use as directed 15 mLs in the mouth or throat at bedtime as needed (if has yeast in mouth). [provider]  Active Child, Pharmacy Records  nystatin-triamcinolone ointment [facility]) [id number]  Apply 1 Application topically daily as needed (Vaginal irritation). [provider]  Active Child, Pharmacy Records  predniSONE (DELTASONE) 1 MG tablet [id number]  Take 2 tablets (2 mg total) by mouth daily with breakfast. Take along with 5 mg tablet daily.  Patient taking differently: Take 2 mg by mouth See admin instructions. Take along with 5 mg for a total of 7 mg in the morning   Rice, [name], MD  Active Child, Pharmacy Records  predniSONE (DELTASONE) 5 MG tablet [id number]  Take 1 tablet (5 mg total) by mouth daily with breakfast.  Patient taking differently: Take 5 mg by mouth See admin instructions. Take with (2) 1 mg for a total of 7 mg in the morning   Rice, [name], MD  Active Child, Pharmacy Records  Prenatal Vit-Fe Fumarate-FA (PRENATAL VITAMINS PO) [id number]  Take 1 tablet by mouth daily. [provider]  Active Child, Pharmacy Records  Probiotic Product (ALIGN) 4 MG CAPS [id number]  Take 4 mg by mouth daily. [provider]  Active Child, Pharmacy Records  Propylene Glycol (SYSTANE COMPLETE) 0.6 % SOLN [id number]  Place 1 drop into both eyes 2 (two) times daily. Kozlow, [name], MD  Active Child, Pharmacy Records  sacubitril-valsartan [facility]) 24-26 [place] [id number]  Take 1 tablet by mouth 2 (two) times daily. Jodelle Red, MD  Active Child, Pharmacy Records  Tiotropium Bromide-Olodaterol (STIOLTO RESPIMAT) 2.5-2.5 MCG/ACT AERS [id number]  Inhale 2 puffs into the lungs daily.  Patient taking differently: Inhale 2 puffs into the lungs at bedtime.   Martina Sinner, MD  Active Child, Pharmacy Records  traMADol Janean Sark) 50 MG tablet [id number]  Take 1 tablet (50 mg total) by mouth at bedtime. [name], MD  Active Child, Pharmacy Records  Triamcinolone Acetonide (TRIAMCINOLONE 0.1 % CREAM : EUCERIN) CREA [id number]  Apply 1 Application topically 2 (two) times daily.  Patient taking differently: Apply 1 Application topically daily as needed for rash or irritation.   [name], MD  Active Child, Pharmacy Records  triamcinolone ointment (KENALOG) 0.5 % [id number]  Apply 1 Application topically 2 (two) times daily. Jaci Standard, MD  Active Child, Pharmacy Records  XARELTO 20 MG TABS tablet [id number]  TAKE 1 TABLET BY MOUTH DAILY WITH SUPPER  Patient taking differently: Take 20 mg by mouth daily with supper.   [name], MD  Active Child, Pharmacy Records  Med List Note Geri Seminole, CPhT 01/04/21 1640):              Home Care and Equipment/Supplies: Were Home Health Services Ordered?: Yes Name of Home Health Agency:: [facility] Has Agency set up a time to come to your home?: No EMR reviewed for Home Health Orders:  (This Clinical research associate called Amy Hyatt at [facility] [phone]) who accepted referral and to call the patient today.  Hospital TOC did not get to see patient for referral.  Patient has no preference of provider.) Any new equipment or medical supplies ordered?:  No  Functional Questionnaire: Do you need assistance with bathing/showering or dressing?: Yes Do you need assistance with meal preparation?: Yes Do you need assistance with eating?: No Do you have difficulty maintaining continence: No Do you need assistance with getting out of bed/getting out of a chair/moving?: Yes Do you have difficulty managing or taking your medications?: No  Follow up appointments reviewed: PCP Follow-up appointment confirmed?: Yes Date of PCP follow-up appointment?: 09/11/22 Follow-up Provider: Dr. Tanya Nones Specialist [facility] Follow-up appointment confirmed?: Yes Date of Specialist follow-up appointment?: 09/12/22 Follow-Up Specialty Provider:: Dr. Nelly Laurence (wound check) Do you need transportation to your follow-up appointment?: No Do you understand care options if your condition(s) worsen?: Yes-patient verbalized understanding  SDOH Interventions Today    Flowsheet Row Most Recent Value  SDOH Interventions   Food Insecurity Interventions Intervention Not Indicated  Housing Interventions Intervention Not Indicated  Transportation Interventions Intervention Not Indicated      TOC Interventions Today    Flowsheet Row Most Recent Value  TOC Interventions   TOC Interventions Discussed/Reviewed TOC Interventions Discussed, TOC Interventions Reviewed, Contacted Home Health RN/OT/PT     Jodelle Gross, RN, BSN, CCM Care Management Coordinator [facility] Phone: [phone]/Fax: [phone]

## 2022-09-06 NOTE — Progress Notes (Unsigned)
Office Visit Note  Patient: Ebony Scott             Date of Birth: 1950/12/10           MRN: [id number]             PCP: [name], MD Referring: [name], MD Visit Date: 09/10/2022   Subjective:  No chief complaint on file.   History of Present Illness: Ebony Scott is a 72 y.o. 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    Previous HPI 05/22/2022 [name] is a 72 y.o. 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  Unfortunately did not see any significant benefit to joint inflammation with additional medication added.  She did get sick with community-acquired pneumonia resulting in hospitalization on March 6.  Respiratory status improved with treatment but unfortunately had a prolonged course 10 days until discharge.  Then had to go to short-term at rehab facility before returning home last weekend.  She was visited by home health physical therapy once on Easter Sunday but has not had any additional follow-up or treatment.  She feels overall weak and mobility is much worse compared to prior to hospitalization.  Also developed new erythematous skin rash involving abdomen and neck.   Previous HPI 03/07/22 Ebony Scott is a 72 y.o. 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 She has increased joint pain in multiple areas especially bilateral hands also left shoulder and right knee. She has seen only a small benefit in symptoms so far after the knee viscosupplementation shots last year. She has pain pretty much all the time both morning and night time. She notices some worsening of lateral deviation in the small finger on her right hand.    Previous HPI 11/29/21 Ebony Scott is a 72 y.o. female here for follow up for seronegative RA and cushing's syndrome currently on long term prednisone. She is on hizentra for hypogammaglobulinemia. Since last  visit she had a bone marrow biopsy for macrocytosis with benign findings. She is having an exacerbation of asthma with wheezing and coughing increased since early September and did not improve well with a prolonged steroid taper. Subsequently has not been prescribed azithromycin and higher dose steroid starting about 5 days ago so far still having a lot of coughing and symptoms. She notices increased joint pain in hands and knees during this episode. She completed a series of 3 viscosupplementation injections in the left knee about 2 weeks ago with a partial benefit.   Previous HPI 08/09/2021 Ebony Scott is a 72 y.o. female here for follow up or seronegative RA and cushing's syndrome on prednisone with gradual tapering but increased back to 7 mg daily after last visit.  She is having some increased joint pain at multiple areas but worst problem is increased pain in her left knee.  This is severe enough to limit her tolerance for weightbearing and mobility.  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  She has not had any severe interval infections.  She is scheduled for hammertoe corrective surgery next month.   Previous HPI 04/28/21 Ebony Scott is a 72 y.o. female here for follow up for seronegative RA and cushing's syndrome on prednisone with gradual tapering from 8 mg in December. She developed worsening iron deficiency anemia  after GI bleed getting venofer replacement. Joint and skin problems were remaining pretty stable until about 2 weeks ot a month ago. This was after decreasing prednisone to 6 mg daily dose. Knee pain limits her mobility but hands have the most increase in swelling. Skin rash on the back of her neck and throughout her low back. She tried topical hydrocortisone without a large improvement so far.   Previous HPI 01/23/21 Ebony Scott is a 72 y.o. female here for follow up for seronegative RA minimally responsive to multiple DMARD treatments  on prednisone 10 mg PO daily with acquired cushing's syndrome. She has noticed some increase in right hand MCP joint swelling but overall joints not dramatically changed with dose reduction. Since our visit she went for coronary artery stent placement after study for evaluating cardiomyopathy found significant stenosis. No anginal symptoms, she is not sure if any different but husband reports noticing some improvement in her dyspnea on exertion. She has had numerous other issues increased upper airway congestion and drainage. Sometimes productive coughing with this. She has had mild nasal bleeding with rhinitis and and sneezing issues.   Previous HPI 11/28/20 Ebony Scott is a 72 y.o. female here for iatrogenic cushing syndrome and history of rheumatoid arthritis with long term prednisone treatment, previously seen by Dr. Deanne Coffer at [facility] for this problem. Mostly had been taking treatments after hospitalization with severe joint inflammation in 2020. She has tried taking numerous DMARD treatments for years including actemra, simponi, abatacept, and rituximab so far without great response or tolerance. Chronic prednisone required for symptoms throughout this time but has taken these longstanding for asthma disease already. She has had some issue with recurrent infections with pulmonary and septic bursitis and more recently noninfectious allergy symptoms managed with Dr. Lucie Leather and Francine Graven. She started on IVIG treatments with Dr. Lucie Leather for this identified recently, not sure if primary or related to previous medication   DMARD Hx Rituximab Abatacept Simponi Actemra IV   No Rheumatology ROS completed.   PMFS History:  Patient Active Problem List   Diagnosis Date Noted   Fever 09/01/2022   Lethargy 08/31/2022   Hypotension 08/31/2022   DNR (do not resuscitate) 08/31/2022   Abnormal CT of the abdomen 04/26/2022   Itching of ear 04/10/2022   Effusion, left knee 08/09/2021   Noninfectious otitis  externa of left ear 06/08/2021   Rash and other nonspecific skin eruption 04/28/2021   Iron deficiency anemia 02/24/2021   GAVE (gastric antral vascular ectasia)    Symptomatic anemia 02/04/2021   Anticoagulated    Antiplatelet or antithrombotic long-term use    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   Coronary artery calcification seen on CAT scan 01/11/2021   Acute kidney injury superimposed on chronic kidney disease (HCC) 10/04/2020   Physical deconditioning 09/25/2020   History of DVT (deep vein thrombosis) 09/24/2020   DMII (diabetes mellitus, type 2) (HCC) 09/23/2020   Cellulitis of right hand 09/22/2020   Abscess of dorsum of right hand 09/22/2020   Hoarseness 09/09/2020   Metabolic encephalopathy 02/24/2020   Acute encephalopathy    AMS (altered mental status) 02/23/2020   S/P reverse total shoulder arthroplasty, right    Hypokalemia    DOE (dyspnea on exertion) 02/18/2020   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   S/P reverse total shoulder arthroplasty, left 02/16/2020   Preoperative clearance 01/26/2020   Closed displaced fracture of metatarsal bone of right foot 01/20/2019   History of pulmonary embolus (PE) 12/19/2018  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   Rheumatoid arthritis (HCC)    Bilateral impacted cerumen 08/18/2018   Fungal otitis externa 08/18/2018   Cerumen debris on tympanic membrane of right ear 08/18/2018   Asthmatic bronchitis with exacerbation 07/29/2018   Pulmonary embolism (HCC) 07/29/2018   Cellulitis of forearm    Fever of unknown origin (FUO) 02/10/2018   Abdominal pain 02/10/2018   Fracture of base of fifth metatarsal bone with routine healing, left    Closed fracture of fifth metatarsal bone of right foot, initial encounter 01/15/2018   DVT (deep venous thrombosis) (HCC) 01/14/2018   Hypothyroidism 01/14/2018   Depression 01/14/2018   Debility    Protein-calorie malnutrition, moderate (HCC) 12/17/2017   Knee pain     History of breast cancer    Chronic pain syndrome    Fibromyalgia    Graves disease    Vasculitis (HCC) 12/13/2017   Rhabdomyolysis 12/13/2017   Dysphonia 08/10/2017   Pulmonary nodules    Chronic kidney disease, stage 3b (HCC) 04/15/2016   Hx of adenomatous colonic polyps 04/12/2016   Cough 02/09/2016   Opacity of lung on imaging study 11/03/2015   Severe persistent asthma 02/08/2015   Facial pain syndrome 02/08/2015   Insomnia 02/08/2015   Other allergic rhinitis 02/08/2015   LPRD (laryngopharyngeal reflux disease) 02/08/2015   Chronic systolic CHF (congestive heart failure) (HCC) 02/08/2015   Diastolic dysfunction 02/08/2015   Benign paroxysmal positional vertigo 12/03/2012   Dizziness and giddiness 10/29/2012   Disequilibrium 10/29/2012   Pseudogout 02/24/2012   Irritable bowel syndrome 01/02/2010   Hyperlipidemia 12/23/2009   Hyperthyroidism 11/12/2006   Seasonal and perennial allergic rhinitis 11/12/2006   GERD 11/12/2006   NECK PAIN, CHRONIC 11/12/2006   OSTEOPOROSIS 11/12/2006   BREAST CANCER, HX OF 11/12/2006    Past Medical History:  Diagnosis Date   Anemia    Angio-edema    Breast cancer (HCC) 1998   Left breast, in remission   Bronchiectasis (HCC)    CHF (congestive heart failure) (HCC)    Clostridium difficile colitis 01/14/2018   Coronary artery disease    Depression    "some; don't take anything for it" (02/20/2012), pt denies dx as of 06/02/21   Diverticulosis    DVT (deep venous thrombosis) (HCC)    Fibromyalgia 11/2011   GAVE (gastric antral vascular ectasia)    GERD (gastroesophageal reflux disease)    Headache(784.0)    "related to allergies; more at different times during the year" (02/20/2012)   Hemorrhoids    Hiatal hernia    back and neck   History of blood transfusion 2023   1 unit, 5 units of Iron   Hx of adenomatous colonic polyps 04/12/2016   Hypercholesteremia    good cholesterol is high   Hypothyroidism    h/o Graves disease   IBS  (irritable bowel syndrome)    Moderate persistent asthma    -FeV1 72% 2011, -IgE 102 2011, CT sinus Neg 2011   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 Casimer Lanius   Sleep apnea    uses cpap nightly   Tracheobronchomalacia     Family History  Problem Relation Age of Onset   Allergies Mother    Heart disease Mother    Arthritis Mother    Lung cancer Mother        heavy  smoker   Diabetes Mother    Allergies Father    Heart disease Father    Arthritis Father    Stroke Father    Heart disease Brother    Diabetes Maternal Grandfather    Colitis Daughter    Colon cancer Other        Maternal half uncle   Past Surgical History:  Procedure Laterality Date   ABDOMINAL HYSTERECTOMY N/A    Phreesia 10/21/2019   ANTERIOR AND POSTERIOR REPAIR  1990's   APPENDECTOMY     ARGON LASER APPLICATION  02/05/2021   Procedure: ARGON LASER APPLICATION;  Surgeon: Benancio Deeds, MD;  Location: [facility];  Service: Gastroenterology;;   BREAST LUMPECTOMY  1998   left   BREAST SURGERY N/A    Phreesia 10/21/2019   BRONCHIAL WASHINGS  04/05/2020   Procedure: BRONCHIAL WASHINGS;  Surgeon: Martina Sinner, MD;  Location: [facility];  Service: Pulmonary;;   CAPSULOTOMY Right 08/28/2021   Procedure: CAPSULOTOMY;  Surgeon: Candelaria Stagers, DPM;  Location: [facility];  Service: Podiatry;  Laterality: Right;   CARPOMETACARPEL (CMC) FUSION OF THUMB WITH AUTOGRAFT FROM RADIUS  ~ 2009   "both thumbs" (02/20/2012)   CATARACT EXTRACTION W/ INTRAOCULAR LENS  IMPLANT, BILATERAL  2012   CERVICAL DISCECTOMY  10/2001   C5-C6   CERVICAL FUSION  2003   C3-C4   CHOLECYSTECTOMY     COLONOSCOPY     CORONARY STENT INTERVENTION N/A 01/11/2021   Procedure: CORONARY STENT INTERVENTION;  Surgeon: Marykay Lex, MD;  Location: [facility] INVASIVE CV LAB;   Service: Cardiovascular;  Laterality: N/A;   DEBRIDEMENT TENNIS ELBOW  ?1970's   right   ESOPHAGOGASTRODUODENOSCOPY     ESOPHAGOGASTRODUODENOSCOPY (EGD) WITH PROPOFOL N/A 02/05/2021   Procedure: ESOPHAGOGASTRODUODENOSCOPY (EGD) WITH PROPOFOL;  Surgeon: Benancio Deeds, MD;  Location: [facility];  Service: Gastroenterology;  Laterality: N/A;   EYE SURGERY N/A    Phreesia 10/21/2019   HAMMER TOE SURGERY Right 08/28/2021   Procedure: HAMMER TOE REPAIR 2ND TOE RIGHT FOOT;  Surgeon: Candelaria Stagers, DPM;  Location: [facility];  Service: Podiatry;  Laterality: Right;   HYSTERECTOMY     I & D EXTREMITY Right 09/22/2020   Procedure: IRRIGATION AND DEBRIDEMENT RIGHT HAND AND ELBOW;  Surgeon: Teryl Lucy, MD;  Location: [facility];  Service: Orthopedics;  Laterality: Right;   ICD IMPLANT N/A 08/27/2022   Procedure: ICD IMPLANT;  Surgeon: Maurice Small, MD;  Location: [facility];  Service: Cardiovascular;  Laterality: N/A;   KNEE ARTHROPLASTY  ?1990's   "?right; w/cartilage repair" (02/20/2012)   MASS EXCISION Left 08/28/2021   Procedure: EXCISION OF SOFT TISSUE  MASS LEFT FOOT;  Surgeon: Candelaria Stagers, DPM;  Location: [facility];  Service: Podiatry;  Laterality: Left;   NASAL SEPTUM SURGERY  1980's   POSTERIOR CERVICAL FUSION/FORAMINOTOMY  2004   "failed initial fusion; rewired  anterior neck" (02/20/2012)   REVERSE SHOULDER ARTHROPLASTY Right 02/16/2020   Procedure: REVERSE SHOULDER ARTHROPLASTY;  Surgeon: Teryl Lucy, MD;  Location: [facility];  Service: Orthopedics;  Laterality: Right;   RIGHT/LEFT HEART CATH AND CORONARY ANGIOGRAPHY N/A 01/11/2021   Procedure: RIGHT/LEFT HEART CATH AND CORONARY ANGIOGRAPHY;  Surgeon: Marykay Lex, MD;  Location: [facility] INVASIVE CV LAB;  Service: Cardiovascular;  Laterality: N/A;   SPINE SURGERY N/A    Phreesia 10/21/2019   TONSILLECTOMY  ~ 1953   VESICOVAGINAL FISTULA CLOSURE W/ TAH  1988   VIDEO BRONCHOSCOPY Bilateral 08/23/2016   Procedure:  VIDEO BRONCHOSCOPY  WITH FLUORO;  Surgeon: Roslynn Amble, MD;  Location: [facility];  Service: Cardiopulmonary;  Laterality: Bilateral;   VIDEO BRONCHOSCOPY N/A 04/05/2020   Procedure: VIDEO BRONCHOSCOPY WITHOUT FLUORO;  Surgeon: Martina Sinner, MD;  Location: [facility];  Service: Pulmonary;  Laterality: N/A;   Social History   Social History Narrative   Patient lives at home alone. Patient  divorced.    Patient has her BS degree.   Right handed.   Caffeine- sometimes coffee.      [facility]:   Born in [place], [place]. She worked as a Armed forces operational officer. She has no pets currently. She does have indoor plants. Previously had mold in her home that was remediated. Carpet was removed.          Immunization History  Administered Date(s) Administered   Covid-19, Mrna,Vaccine(Spikevax)64yrs and older 01/08/2022   DTaP 08/18/2013   Fluad Quad(high Dose 65+) 10/16/2018, 11/20/2019, 11/03/2020   Hepatitis A 09/04/2007, 03/02/2008   Hepatitis B 01/07/1985, 02/06/1985, 08/17/1985   Influenza Split 11/13/2010, 11/22/2011, 10/20/2012   Influenza Whole 11/14/2009, 11/21/2011   Influenza, High Dose Seasonal PF 11/09/2015, 11/27/2017   Influenza, Quadrivalent, Recombinant, Inj, Pf 11/20/2019   Influenza,inj,Quad PF,6+ Mos 11/06/2013, 11/09/2014, 11/06/2016   Influenza-Unspecified 11/06/2021   Meningococcal Conjugate 09/04/2007   PFIZER(Purple Top)SARS-COV-2 Vaccination 03/12/2019, 04/01/2019, 12/16/2019, 06/29/2020   Pfizer Covid-19 Vaccine Bivalent Booster 51yrs & up 03/02/2021   Pneumococcal Conjugate-13 05/18/2013   Pneumococcal Polysaccharide-23 01/05/1994, 11/28/2011, 05/27/2017   Respiratory Syncytial Virus Vaccine,Recomb Aduvanted(Arexvy) 11/06/2021   Td 07/24/1995, 03/16/2005   Tdap 08/18/2013   Zoster Recombinant(Shingrix) 09/18/2017   Zoster, Live 03/02/2008     Objective: Vital Signs: There were no vitals taken for this visit.   Physical Exam   Musculoskeletal Exam: ***  CDAI  Exam: CDAI Score: -- Patient Global: --; Provider Global: -- Swollen: --; Tender: -- Joint Exam 09/10/2022   No joint exam has been documented for this visit   There is currently no information documented on the homunculus. Go to the Rheumatology activity and complete the homunculus joint exam.  Investigation: No additional findings.  Imaging: DG Chest Port 1 View  Result Date: 08/31/2022 CLINICAL DATA:  Provided history: Questionable sepsis-evaluate for abnormality. EXAM: PORTABLE CHEST 1 VIEW COMPARISON:  Prior chest radiograph 08/27/2022 and earlier. FINDINGS: Left chest single-lead implantable cardiac device. Cardiomegaly. A nipple shadow projects over the left lung base. Small linear/bandlike opacities within the left lung base. Mild chronic elevation of the right hemidiaphragm. No appreciable airspace consolidation on the right. No evidence of pleural effusion or pneumothorax. No acute osseous abnormality identified. Incompletely imaged ACDF hardware. Metallic cerclage wires at the level of the cervicothoracic junction. Prior right shoulder arthroplasty. IMPRESSION: 1. Small linear/bandlike opacities within the left lung base. The appearance favors atelectasis. However, pneumonia is difficult to definitively exclude. 2. Cardiomegaly. Electronically Signed   By: Jackey Loge D.O.   On: 08/31/2022 12:18   DG Chest 2 View  Result Date: 08/27/2022 CLINICAL DATA:  Cardiac device in situ. EXAM: CHEST - 2 VIEW COMPARISON:  April 30, 2022. FINDINGS: Stable cardiomediastinal silhouette. Interval placement of single lead left-sided defibrillator with lead in grossly good position. No pneumothorax is noted. Both lungs are clear. The visualized skeletal structures are unremarkable. IMPRESSION: Interval placement of single lead left-sided defibrillator. Electronically Signed   By: Lupita Raider M.D.   On: 08/27/2022 17:32   EP PPM/ICD IMPLANT  Result Date: 08/27/2022 Table formatting from the  original result was  not included.  SURGEON:  York Pellant, MD    PREPROCEDURE DIAGNOSES:  1. CHFrEF    POSTPROCEDURE DIAGNOSES:  1. CHFrEF    PROCEDURES:   1. Subcutaneous ICD placement    INTRODUCTION: ARAINA BUTRICK is a 72 y.o. patient who presents to the EP lab for dual chamber permanent pacemaker implantation.    DESCRIPTION OF PROCEDURE:  Informed written consent was obtained and the patient was brought to the electrophysiology lab in the fasting state. The patient was adequately sedated with intravenous Versed, and fentanyl as outlined in the nursing report.  The patient's left chest was prepped and draped in the usual sterile fashion by the EP lab staff.  The skin overlying the left deltopectoral region was infiltrated with lidocaine for local analgesia.  An incision was created over the left deltopectoral region.  A left subcutaneous pocket was fashioned using a combination of sharp and blunt dissection immediately superficial to the pectoral fascia.  Electrocautery was used to assure hemostasis. Lead Placement: The left axillary vein was cannulated using modified seldinger techniques.  Through the left axillary vein, an RV ICD lead was advanced to the RV apex. The lead was secured to the pectoral fascia.Lead parameters are detailed below.  RV Model [id number] Serial # [id number] Amplitude 13.4 mV Threshold 1.0 V @ 0.5 S Impedence 656 ohms The pocket was irrigated with copious gentamicin solution.  The leads were then  connected to a pulse generator (model [place] VR, serial [id number]). The pocket was closed in layers of absorbable suture. EBL < 10mL. Steri-strips and a sterile dressing were applied.  During this procedure the patient is administered Versed and Fentanyl by a trained provider under my direct supervision to achieve and maintain moderate conscious sedation.  The patient's heart rate, blood pressure, and oxygen saturation are monitored continuously during the procedure. The period of conscious  sedation is 35 minutes, of which I was present face-to-face 100% of this time.    CONCLUSIONS:  1. Successful single chamber ICD  3.  No early apparent complications. York Pellant, MD Cardiac Electrophysiology   CT SHOULDER LEFT WO CONTRAST  Result Date: 08/17/2022 CLINICAL DATA:  Shoulder pain EXAM: CT OF THE UPPER LEFT EXTREMITY WITHOUT CONTRAST TECHNIQUE: Multidetector CT imaging of the upper left extremity was performed according to the standard protocol. RADIATION DOSE REDUCTION: This exam was performed according to the departmental dose-optimization program which includes automated exposure control, adjustment of the mA and/or kV according to patient size and/or use of iterative reconstruction technique. COMPARISON:  X-ray 03/07/2022, MRI 07/28/2022 FINDINGS: Bones/Joint/Cartilage Subchondral fracture of the posteromedial aspect of the humeral head involving a 1.4 x 1.3 cm area of bone. No additional fracture. No dislocation. Severe glenohumeral joint osteoarthritis manifested by joint space narrowing, subchondral sclerosis, and marginal osteophyte formation. Small-moderate-sized glenohumeral joint effusion. Moderate degenerative changes of the acromioclavicular joint. Moderate-large volume subacromial-subdeltoid bursal fluid collection. Remaining visualized osseous structures appear grossly intact. Ligaments Suboptimally assessed by CT. Muscles and Tendons Rotator cuff tears better assessed on recent MRI. Associated rotator cuff muscle atrophy. Soft tissues No soft tissue fluid collection or hematoma. No axillary lymphadenopathy. Visualized lung field is clear. Aortic and coronary artery atherosclerosis. IMPRESSION: 1. Subchondral fracture of the posteromedial aspect of the humeral head. 2. Severe glenohumeral joint osteoarthritis. 3. Moderate-large volume subacromial-subdeltoid bursal fluid collection. 4. Moderate degenerative changes of the acromioclavicular joint. 5. Aortic and coronary artery  atherosclerosis (ICD10-I70.0). Electronically Signed   By: Duanne Guess D.O.   On: 08/17/2022 15:33  Recent Labs: Lab Results  Component Value Date   WBC 9.7 09/03/2022   HGB 8.8 (L) 09/03/2022   PLT 182 09/03/2022   NA 139 09/03/2022   K 3.8 09/03/2022   CL 108 09/03/2022   CO2 20 (L) 09/03/2022   GLUCOSE 91 09/03/2022   BUN 25 (H) 09/03/2022   CREATININE 1.30 (H) 09/03/2022   BILITOT 0.5 08/31/2022   ALKPHOS 49 08/31/2022   AST 37 08/31/2022   ALT 24 08/31/2022   PROT 6.5 08/31/2022   ALBUMIN 2.7 (L) 08/31/2022   CALCIUM 9.0 09/03/2022   GFRAA 40 (L) 08/25/2020    Speciality Comments: No specialty comments available.  Procedures:  No procedures performed Allergies: Baclofen, Dust mite extract, Molds & smuts, Morphine and codeine, Other, Penicillins, Rofecoxib, Shellfish allergy, Shrimp extract, Tetracycline hcl, Tetracycline hcl, Xolair [omalizumab], Zoledronic acid, Dilaudid [hydromorphone hcl], Hydrocodone-acetaminophen, Levofloxacin, Oxycodone hcl, Oxycodone hcl, Paroxetine, Celecoxib, Diltiazem hcl, Lactose, Lactose intolerance (gi), Oxycodone-acetaminophen, Rituximab, Tree extract, Gamunex [immune globulin], and Penicillin g procaine   Assessment / Plan:     Visit Diagnoses: No diagnosis found.  ***  Orders: No orders of the defined types were placed in this encounter.  No orders of the defined types were placed in this encounter.    Follow-Up Instructions: No follow-ups on file.   Ellen Henri, CMA  Note - This record has been created using Animal nutritionist.  Chart creation errors have been sought, but may not always  have been located. Such creation errors do not reflect on  the standard of medical care.

## 2022-09-07 DIAGNOSIS — Z7984 Long term (current) use of oral hypoglycemic drugs: Secondary | ICD-10-CM | POA: Diagnosis not present

## 2022-09-07 DIAGNOSIS — I5022 Chronic systolic (congestive) heart failure: Secondary | ICD-10-CM | POA: Diagnosis not present

## 2022-09-07 DIAGNOSIS — E1122 Type 2 diabetes mellitus with diabetic chronic kidney disease: Secondary | ICD-10-CM | POA: Diagnosis not present

## 2022-09-07 DIAGNOSIS — Z86718 Personal history of other venous thrombosis and embolism: Secondary | ICD-10-CM | POA: Diagnosis not present

## 2022-09-07 DIAGNOSIS — I2510 Atherosclerotic heart disease of native coronary artery without angina pectoris: Secondary | ICD-10-CM | POA: Diagnosis not present

## 2022-09-07 DIAGNOSIS — M797 Fibromyalgia: Secondary | ICD-10-CM | POA: Diagnosis not present

## 2022-09-07 DIAGNOSIS — N1832 Chronic kidney disease, stage 3b: Secondary | ICD-10-CM | POA: Diagnosis not present

## 2022-09-07 DIAGNOSIS — J45909 Unspecified asthma, uncomplicated: Secondary | ICD-10-CM | POA: Diagnosis not present

## 2022-09-07 DIAGNOSIS — I959 Hypotension, unspecified: Secondary | ICD-10-CM | POA: Diagnosis not present

## 2022-09-09 IMAGING — DX DG SHOULDER 2+V PORT*R*
3 series · 3 of 3 positions shown · non-contrast
Comparison: February 16, 2020.

CLINICAL DATA: Pain following fall

EXAM:
PORTABLE RIGHT SHOULDER: 2 + V

[shoulder ap (1 of 2)]
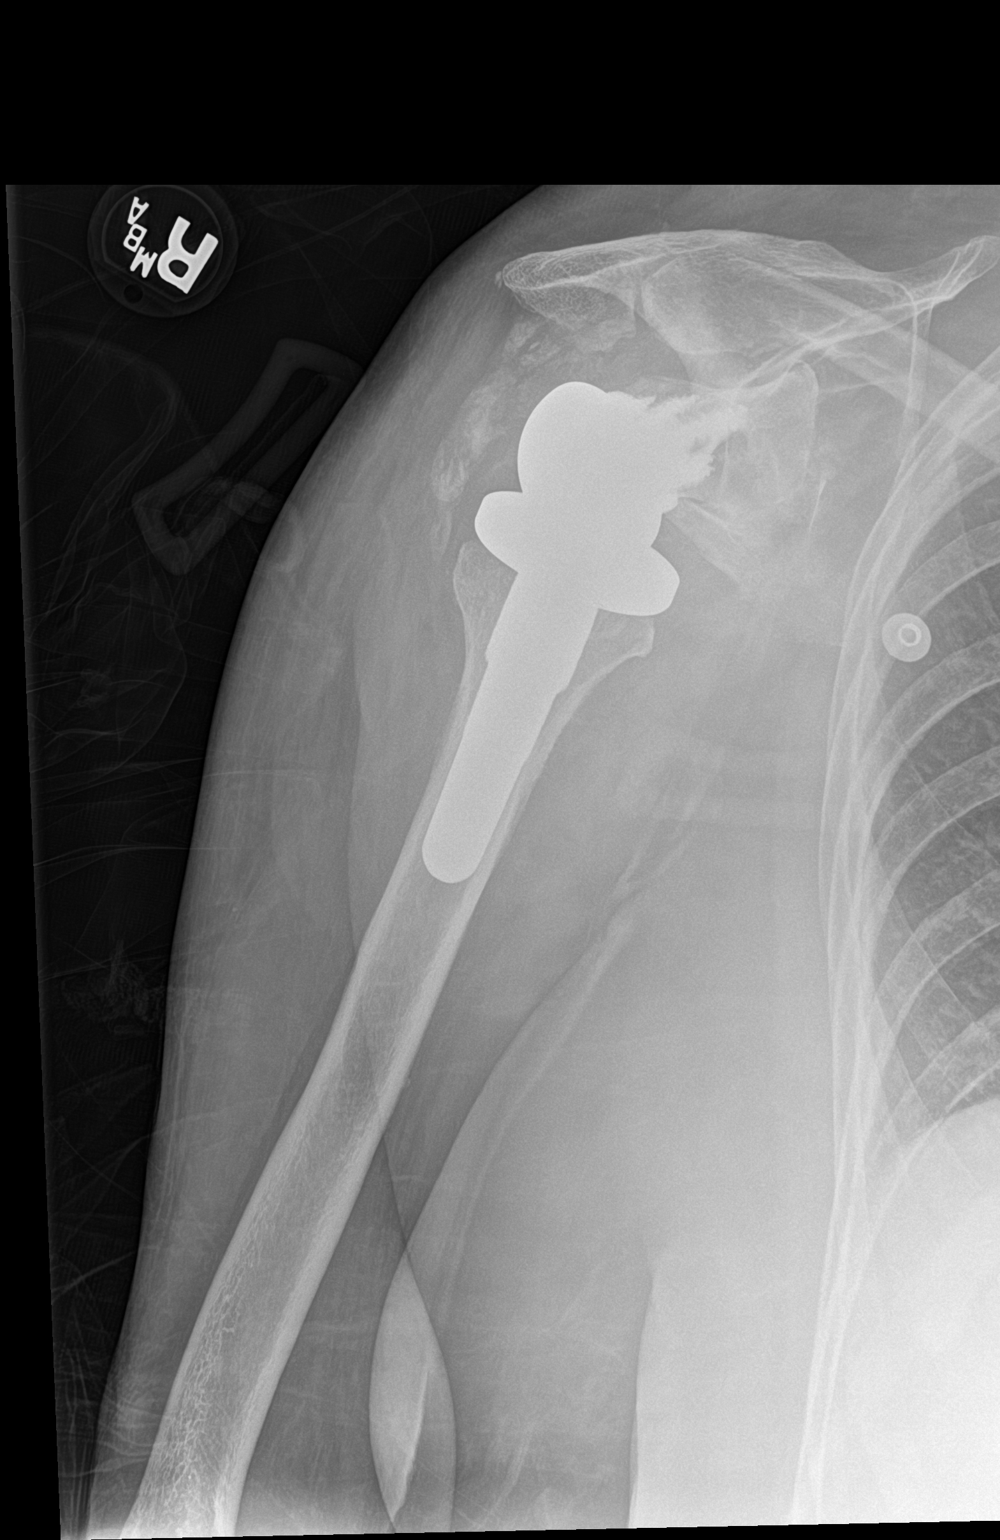

[shoulder ap (2 of 2)]
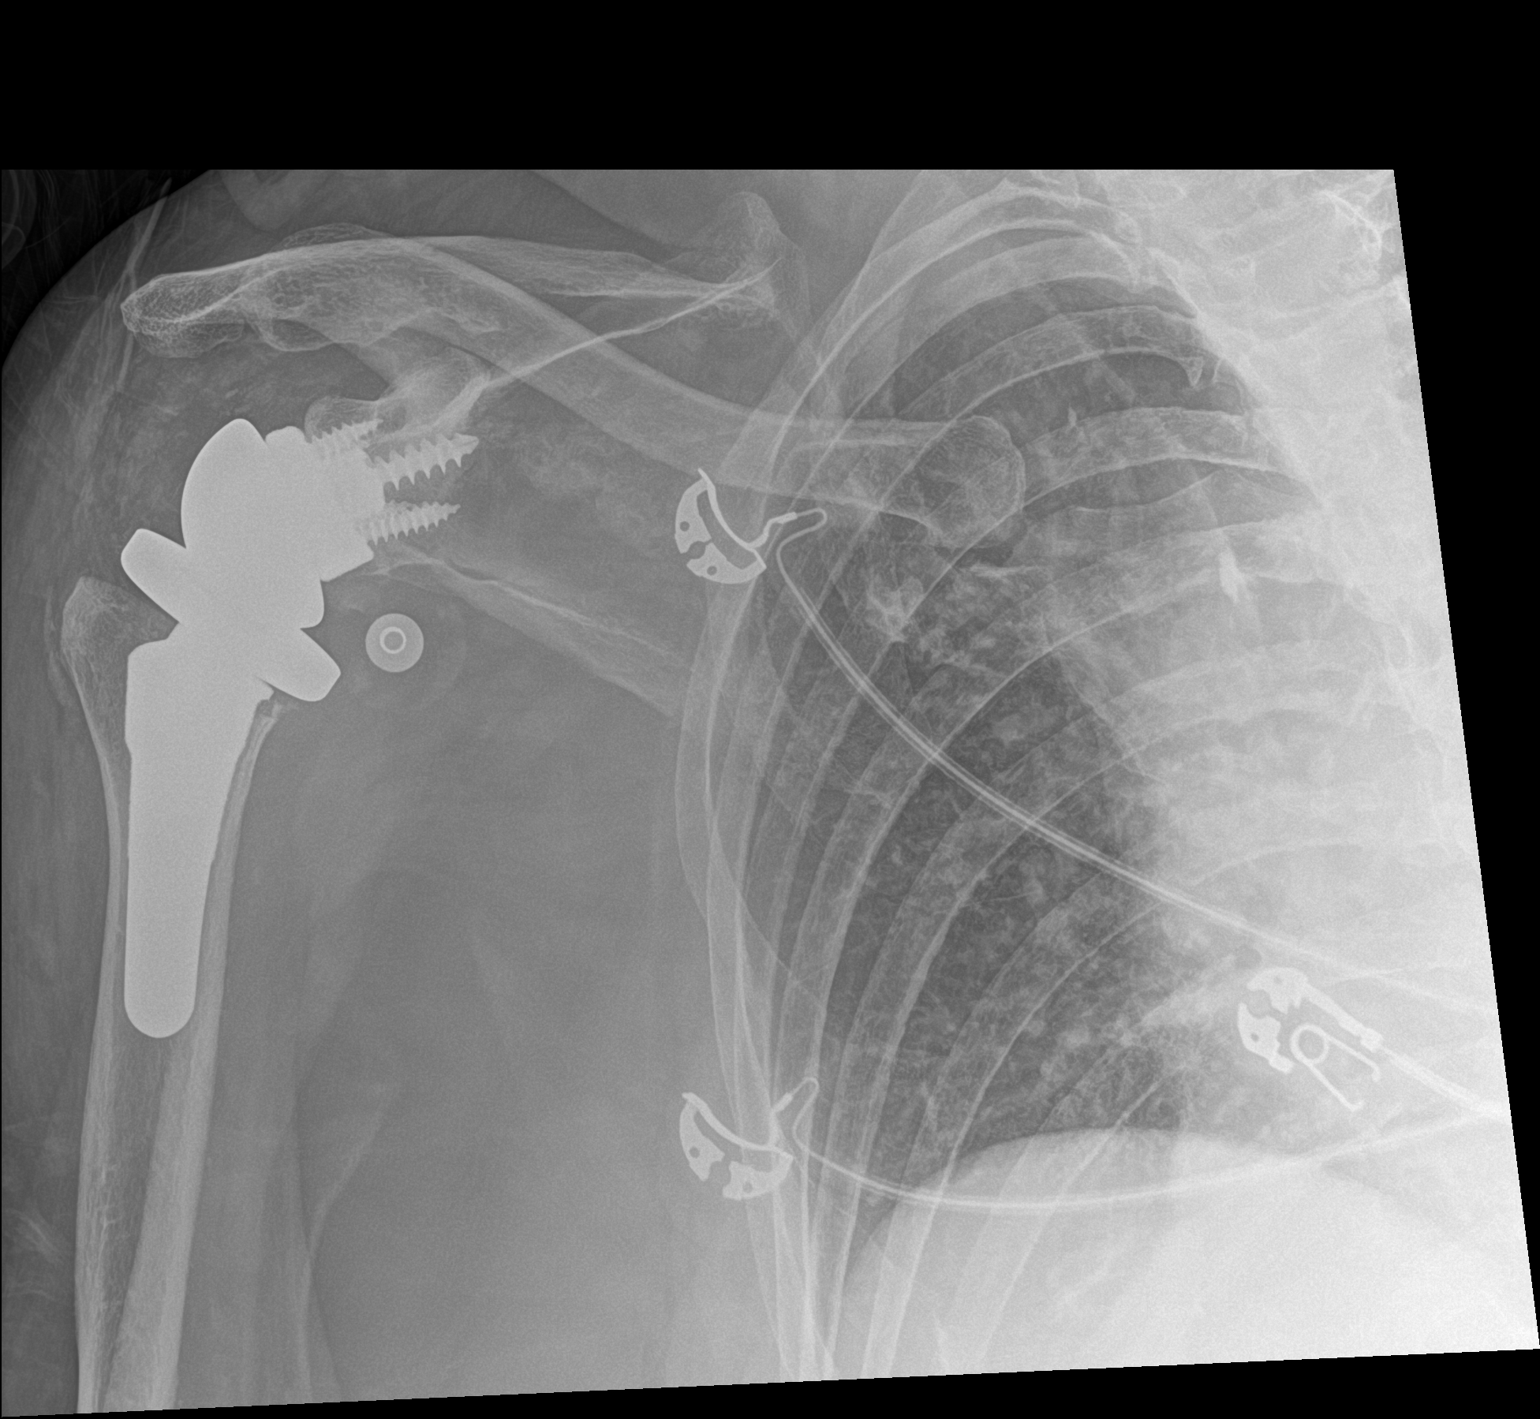

[shoulder obl]
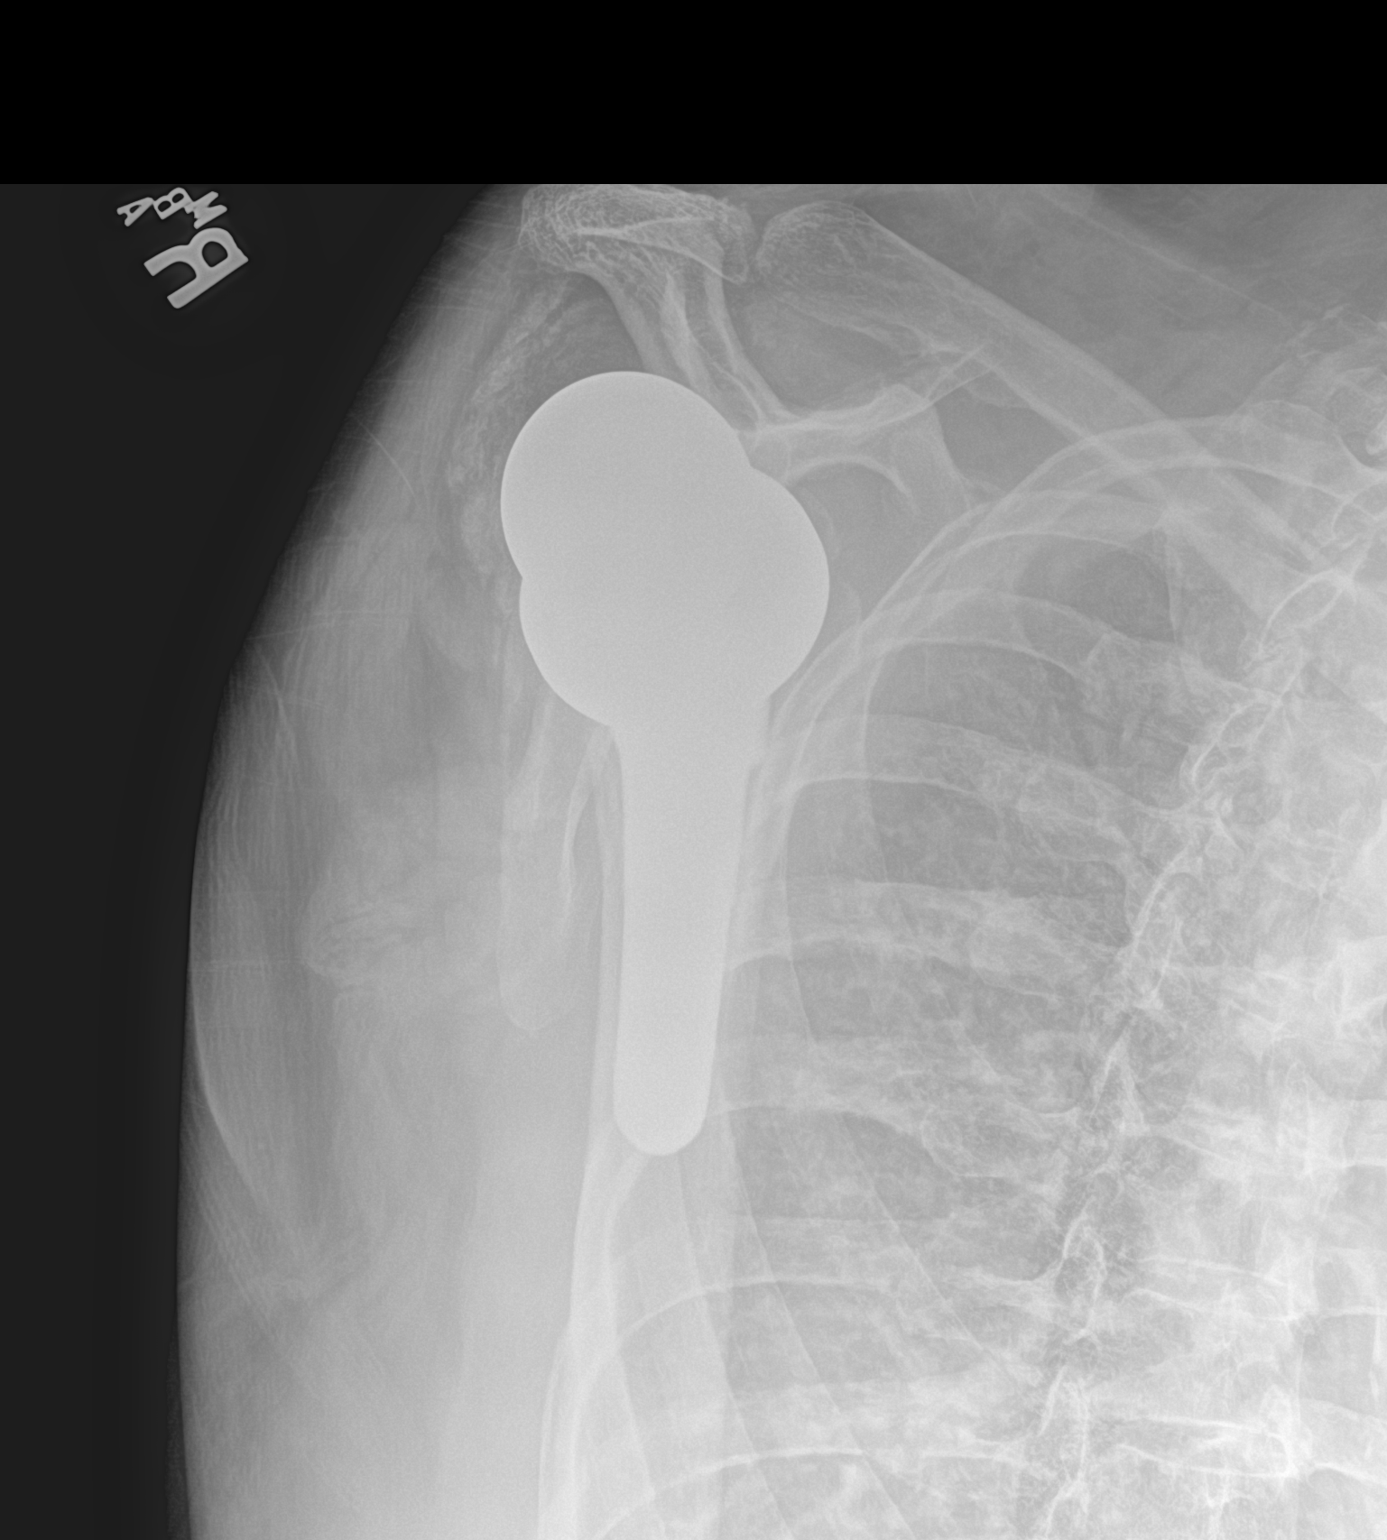

[3 of 3 positions shown; findings below may reference images not displayed]

FINDINGS: Oblique, frontal, and Y scapular images were obtained. Patient is
status post total shoulder replacement with prosthetic components
well-seated. No acute fracture or evident dislocation. Extensive
bursal calcification laterally appear stable. No erosion. Visualized
right lung clear.
IMPRESSION: Total shoulder replacement with prosthetic components appearing
well-seated. No acute fracture or dislocation. Extensive bursal
calcification is stable.

## 2022-09-09 IMAGING — CT CT HEAD W/O CM
3 series · 14 of 47 positions shown, 16 images · non-contrast
Comparison: Radiographs October 09, 2013. CT head March 06, 2018.

CLINICAL DATA: Trauma.  Fall.

EXAM:
CT HEAD WITHOUT CONTRAST
CT CERVICAL SPINE WITHOUT CONTRAST
TECHNIQUE: Multidetector CT imaging of the head and cervical spine was
performed following the standard protocol without intravenous
contrast. Multiplanar CT image reconstructions of the cervical spine
were also generated.

[Series 3: head 5.0 h30s · axial · 0.45mm/px · z∈[-175,-30]mm · 8 of 35 slices shown, 10 images]
[im 3/35  brain]
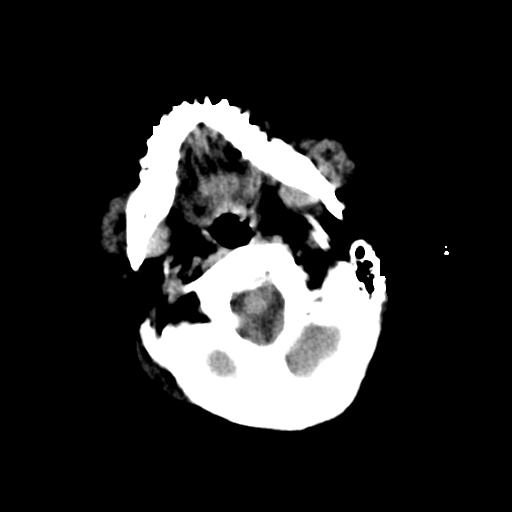
[im 3/35  bone]
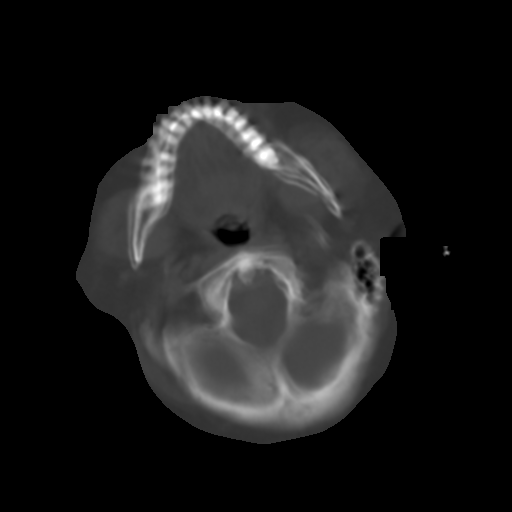
[im 8/35  brain]
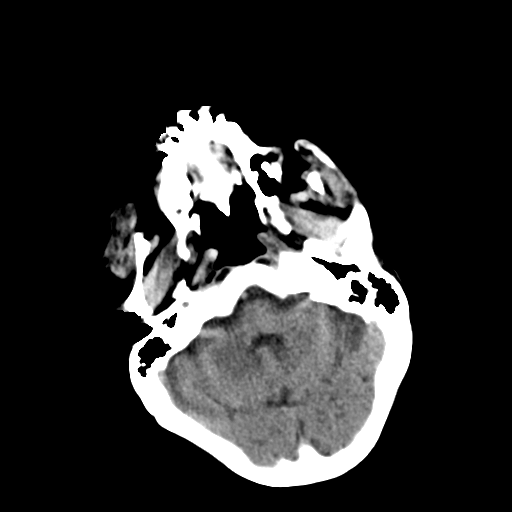
[im 11/35  brain]
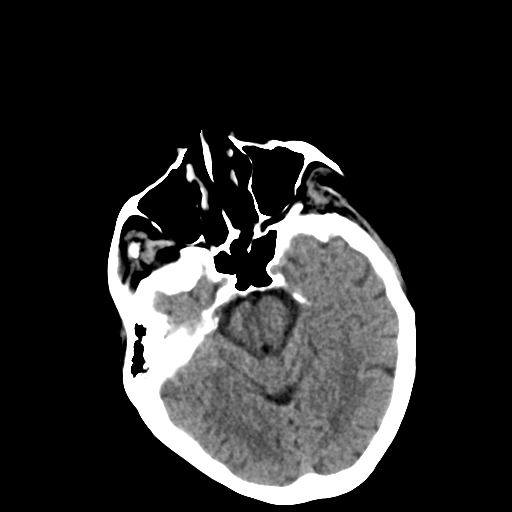
[im 16/35  brain]
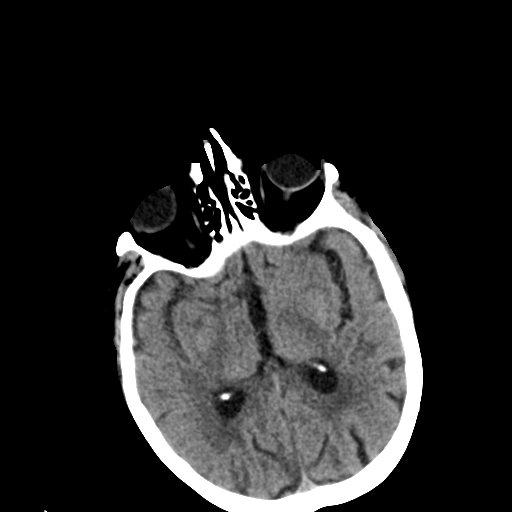
[im 19/35  brain]
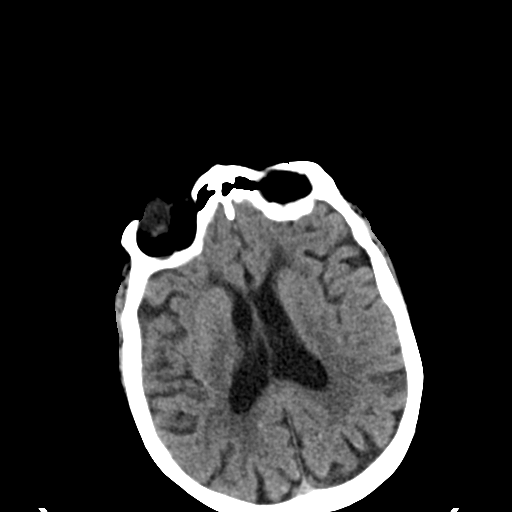
[im 19/35  bone]
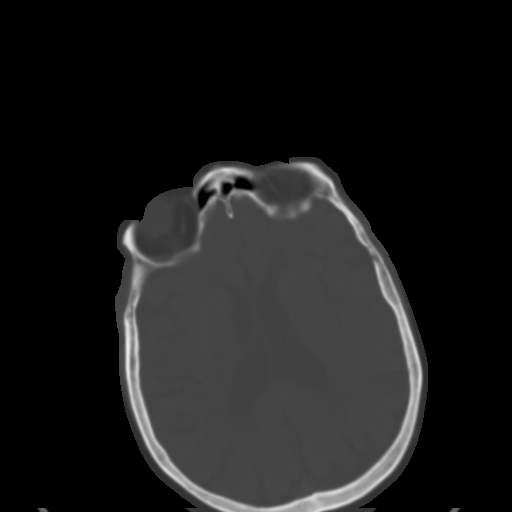
[im 24/35  brain]
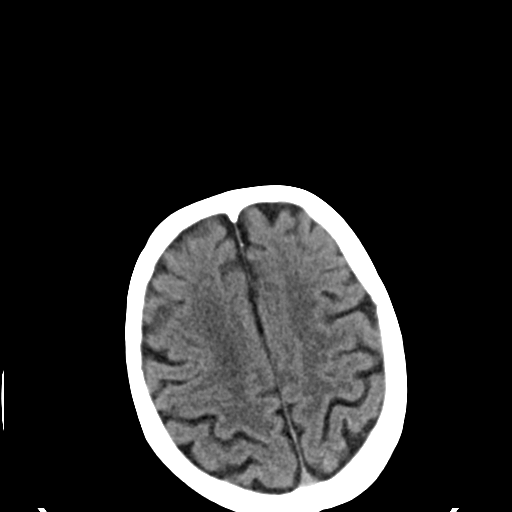
[im 27/35  brain]
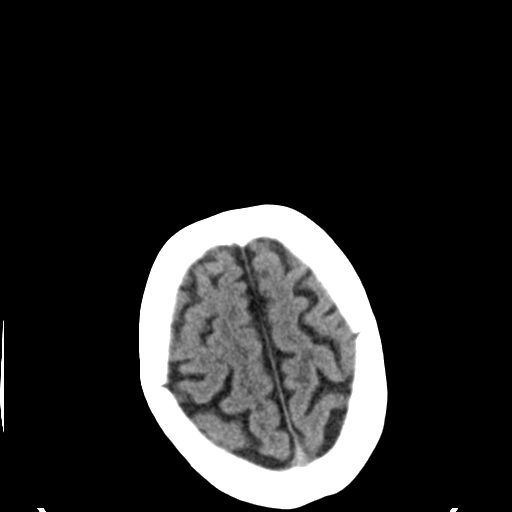
[im 32/35  brain]
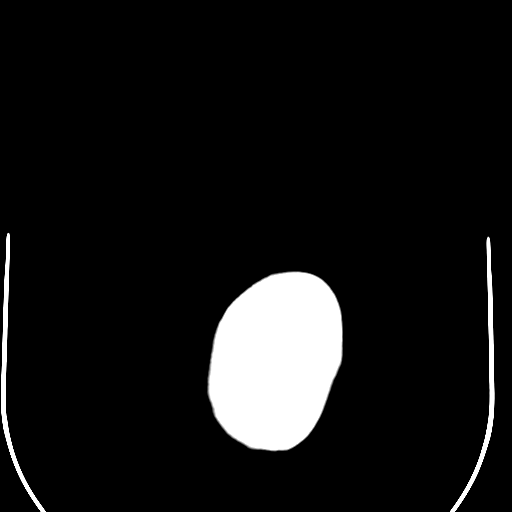

[Series 5: head 3.0 mpr cor · coronal · 0.34mm/px · 3 of 76 slices shown]
[im 26/76  brain]
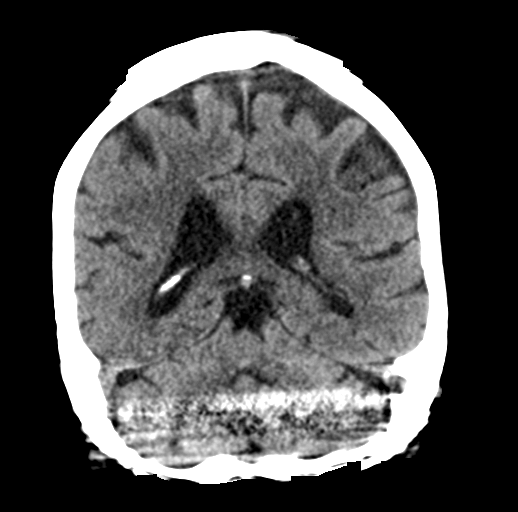
[im 34/76  brain]
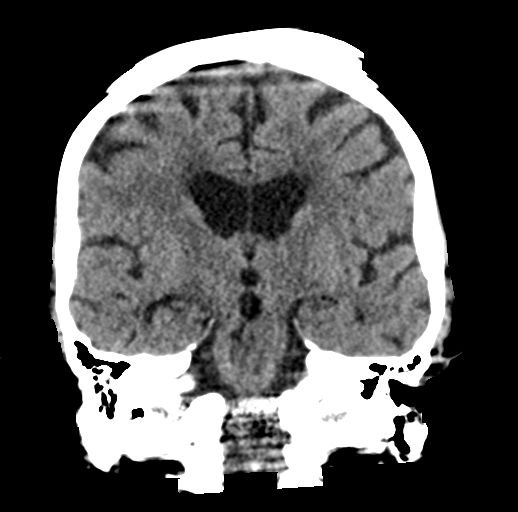
[im 42/76  brain]
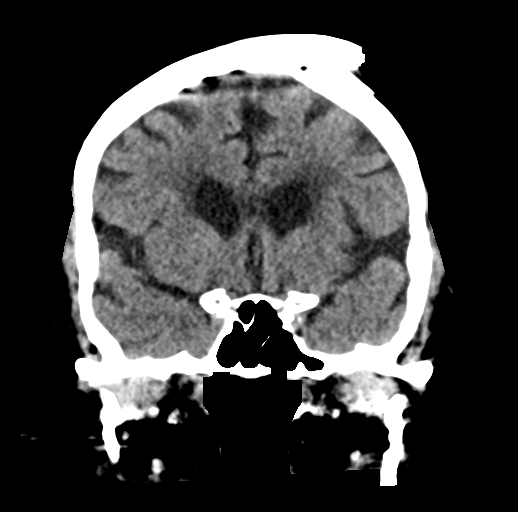

[Series 6: head 3.0 mpr sag · sagittal · 0.35mm/px · 3 of 67 slices shown]
[im 23/67  brain]
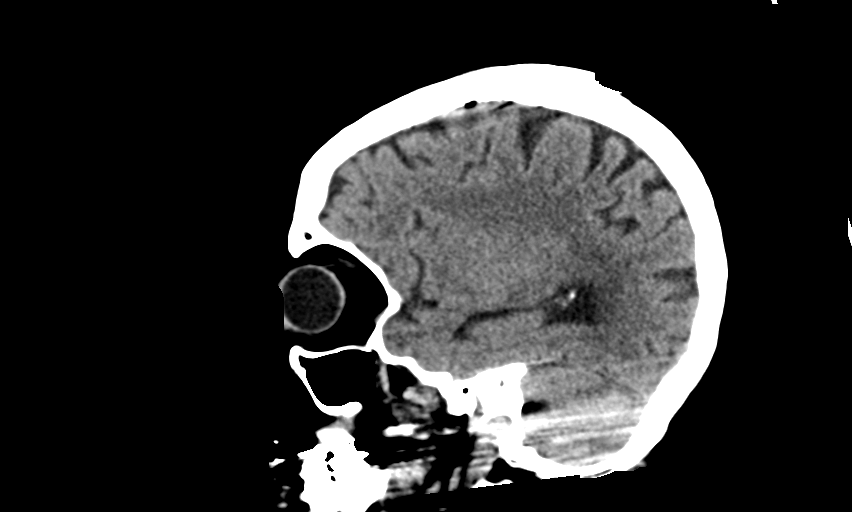
[im 34/67  brain]
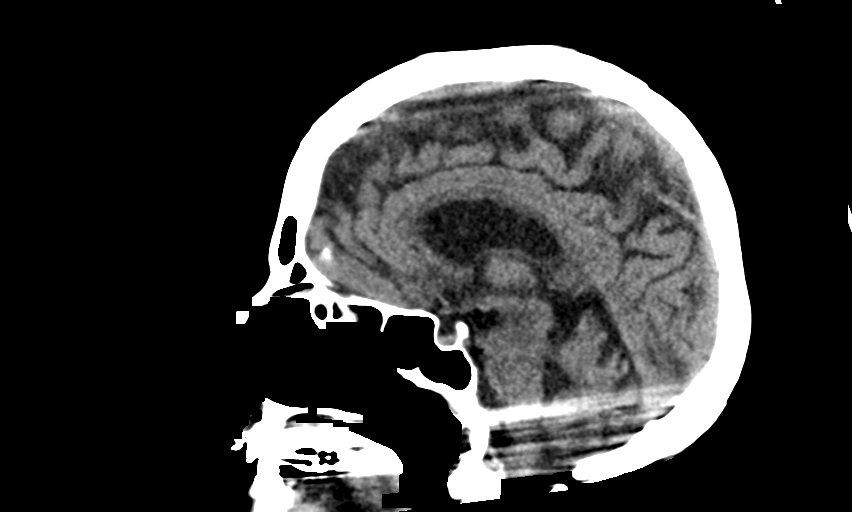
[im 45/67  brain]
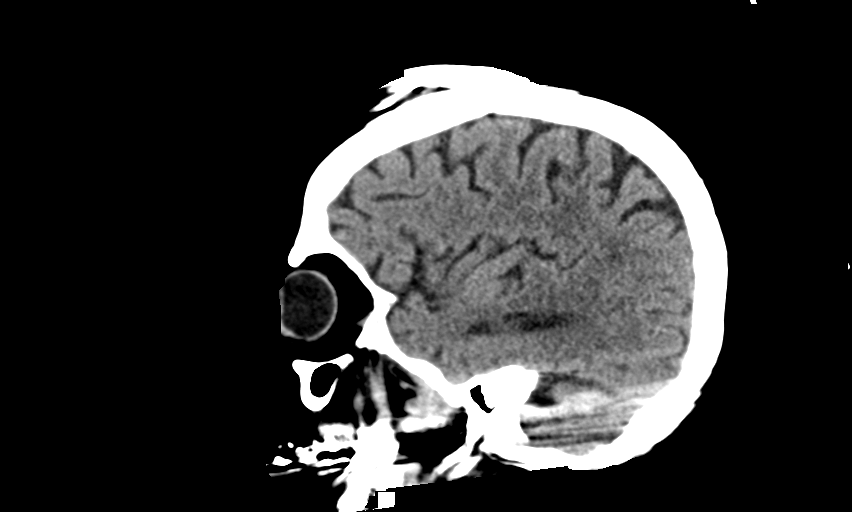

[14 of 47 positions shown; findings below may reference images not displayed]

FINDINGS: CT HEAD FINDINGS

Brain: No evidence of acute large vascular territory infarction,
hemorrhage, hydrocephalus, extra-axial collection or mass
lesion/mass effect. Patchy white matter hypoattenuation, most likely
related to chronic microvascular ischemic disease. Mild generalized
cerebral volume loss with ex vacuo ventricular dilation.

Vascular: Calcific atherosclerosis.

Skull: No acute fracture.

Sinuses/Orbits: Ethmoid air cell mucosal thickening without
air-fluid levels. Unremarkable orbits.

Other: No mastoid effusions.

CT CERVICAL SPINE FINDINGS

Nondiagnostic evaluation at the craniocervical junction secondary to
motion. Within this limitation:

Alignment: There is approximately 5 mm of anterolisthesis of C4 on
C5, favored chronic/degenerative given anterolisthesis on prior
radiographs from October 09, 2013. Otherwise, no substantial sagittal
subluxation.

Skull base and vertebrae: Osseous fusion of the C6-C7 vertebral
bodies. ACDF spanning C3-C4. No evidence of acute fracture.
Vertebral body heights are maintained. Post cerclage wires at C6-C7.

Soft tissues and spinal canal: No visible prevertebral fluid or
swelling. No visible canal hematoma, although evaluation of the
canal is limited by motion.

Disc levels: Multilevel facet hypertrophy. Multilevel mild
degenerative disc disease.

Upper chest: Negative.
IMPRESSION: 1. No evidence of acute intracranial abnormality.
2. No evidence of acute cervical spine fracture, although evaluation
at the craniocervical junction is nondiagnostic secondary to motion.
3. Approximately 5 mm of anterolisthesis of C4 on C5, favored
chronic/degenerative given anterolisthesis on prior radiographs from
October 09, 2013.

## 2022-09-09 IMAGING — DX DG PORTABLE PELVIS
2 series · 2 of 2 positions shown · non-contrast
Comparison: None.

CLINICAL DATA: Unwitnessed fall

EXAM:
PORTABLE PELVIS 1-2 VIEWS

[pelvis ap (1 of 2)]
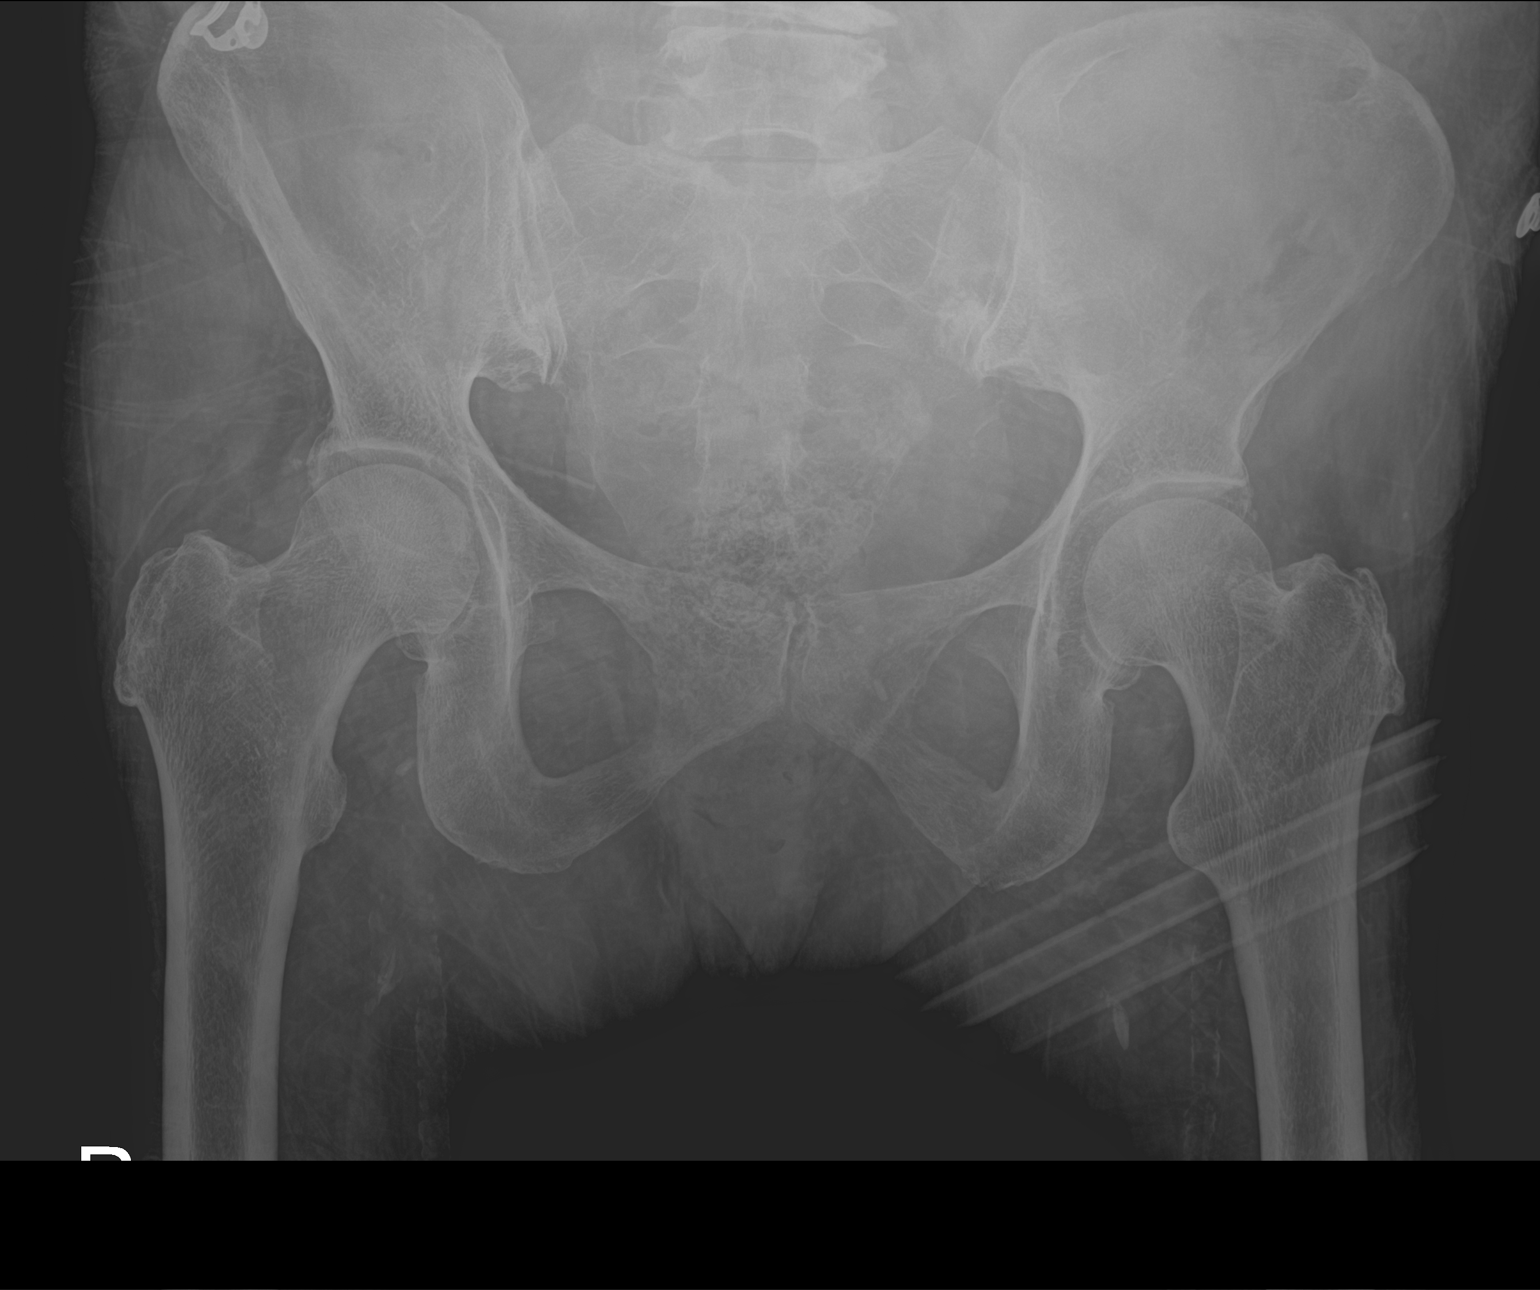

[pelvis ap (2 of 2)]
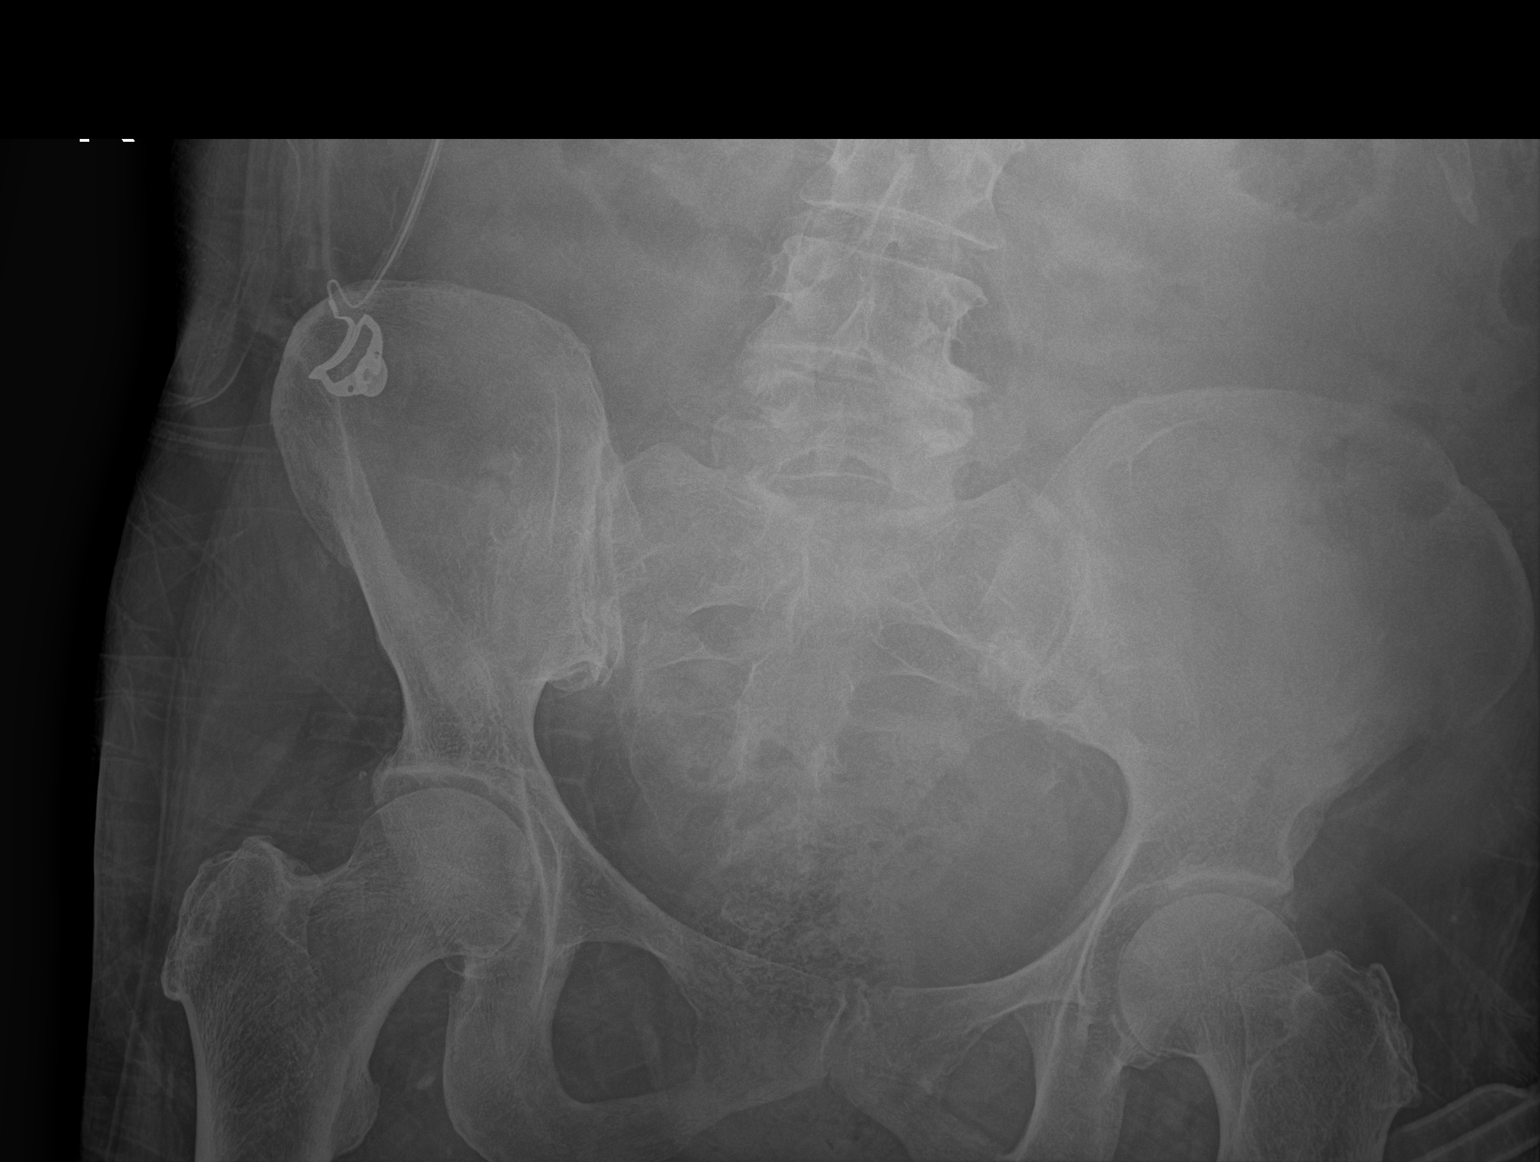

[2 of 2 positions shown; findings below may reference images not displayed]

FINDINGS: There is no evidence of pelvic fracture or dislocation. Mild
symmetric narrowing of each hip joint noted. Degenerative change
noted in visualized lower lumbar spine. No erosive change.
IMPRESSION: Mild narrowing each hip joint.  No fracture or dislocation.

## 2022-09-10 ENCOUNTER — Encounter: Payer: Self-pay | Admitting: Internal Medicine

## 2022-09-10 ENCOUNTER — Ambulatory Visit: Payer: Medicare PPO | Attending: Internal Medicine | Admitting: Internal Medicine

## 2022-09-10 ENCOUNTER — Ambulatory Visit (HOSPITAL_BASED_OUTPATIENT_CLINIC_OR_DEPARTMENT_OTHER): Payer: Medicare PPO | Admitting: Cardiology

## 2022-09-10 VITALS — BP 103/59 | HR 70 | Resp 16 | Ht 60.0 in | Wt 127.0 lb

## 2022-09-10 DIAGNOSIS — R21 Rash and other nonspecific skin eruption: Secondary | ICD-10-CM

## 2022-09-10 DIAGNOSIS — M0600 Rheumatoid arthritis without rheumatoid factor, unspecified site: Secondary | ICD-10-CM

## 2022-09-10 DIAGNOSIS — R197 Diarrhea, unspecified: Secondary | ICD-10-CM

## 2022-09-10 DIAGNOSIS — M79641 Pain in right hand: Secondary | ICD-10-CM

## 2022-09-10 DIAGNOSIS — Z7952 Long term (current) use of systemic steroids: Secondary | ICD-10-CM | POA: Diagnosis not present

## 2022-09-10 IMAGING — DX DG CHEST 1V PORT
1 series · 1 of 1 positions shown · non-contrast
Comparison: 02/23/2020

CLINICAL DATA: Systemic inflammatory response syndrome

EXAM:
PORTABLE CHEST 1 VIEW

[chest]
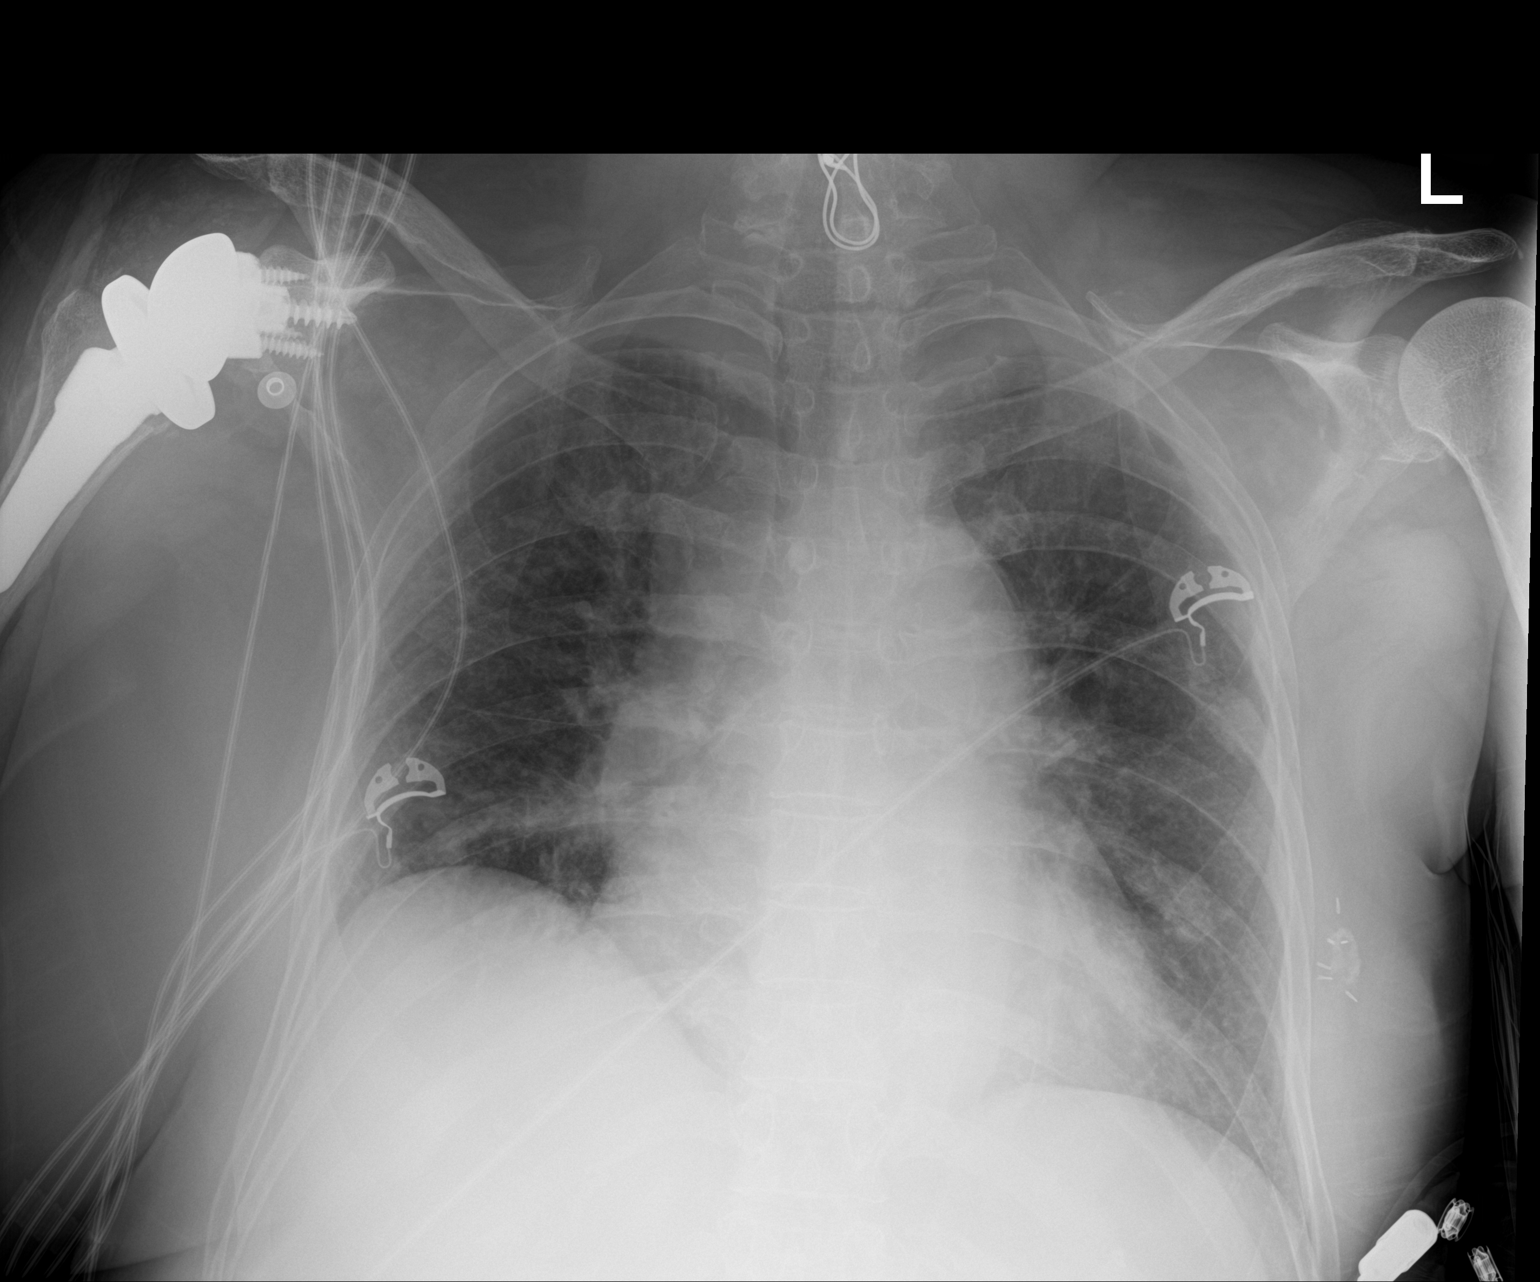

[1 of 1 positions shown; findings below may reference images not displayed]

FINDINGS: Stable mild elevation of the right hemidiaphragm. Mild bibasilar
atelectasis. No pneumothorax or pleural effusion. Cardiac size is
within normal limits. No acute bone abnormality. Right total
shoulder arthroplasty and cervical cerclage wire are noted. Multiple
surgical clips surrounding and area of dystrophic calcification
within the left breast.
IMPRESSION: Mild bibasilar atelectasis.  No interval change.

## 2022-09-10 MED ORDER — TRIAMCINOLONE ACETONIDE 40 MG/ML IJ SUSP
15.0000 mg | INTRAMUSCULAR | Status: AC | PRN
Start: 2022-09-10 — End: 2022-09-10
  Administered 2022-09-10: 15 mg via INTRA_ARTICULAR

## 2022-09-10 NOTE — Patient Instructions (Signed)
I recommend adding probiotic supplement and a food with active cultures such as yoghurt or kefir.

## 2022-09-11 ENCOUNTER — Ambulatory Visit: Payer: Medicare PPO | Admitting: Family Medicine

## 2022-09-11 ENCOUNTER — Encounter: Payer: Self-pay | Admitting: Family Medicine

## 2022-09-11 ENCOUNTER — Ambulatory Visit
Admission: RE | Admit: 2022-09-11 | Discharge: 2022-09-11 | Disposition: A | Discharge status: Home / Self Care | Payer: Medicare PPO | Source: Ambulatory Visit | Attending: Family Medicine | Admitting: Family Medicine

## 2022-09-11 VITALS — BP 112/52 | HR 77 | Temp 97.7°F | Ht 60.0 in | Wt 127.0 lb

## 2022-09-11 DIAGNOSIS — E1169 Type 2 diabetes mellitus with other specified complication: Secondary | ICD-10-CM | POA: Diagnosis not present

## 2022-09-11 DIAGNOSIS — Z09 Encounter for follow-up examination after completed treatment for conditions other than malignant neoplasm: Secondary | ICD-10-CM | POA: Diagnosis not present

## 2022-09-11 DIAGNOSIS — J189 Pneumonia, unspecified organism: Secondary | ICD-10-CM | POA: Diagnosis not present

## 2022-09-11 DIAGNOSIS — I5020 Unspecified systolic (congestive) heart failure: Secondary | ICD-10-CM | POA: Diagnosis not present

## 2022-09-11 DIAGNOSIS — M0600 Rheumatoid arthritis without rheumatoid factor, unspecified site: Secondary | ICD-10-CM

## 2022-09-11 DIAGNOSIS — N1832 Chronic kidney disease, stage 3b: Secondary | ICD-10-CM

## 2022-09-11 LAB — CBC WITH DIFFERENTIAL/PLATELET
Basophils Absolute: 38 cells/uL (ref 0–200)
HCT: 31.2 % — ABNORMAL LOW (ref 35.0–45.0)
Lymphs Abs: 739 cells/uL — ABNORMAL LOW (ref 850–3900)
MCH: 33.6 pg — ABNORMAL HIGH (ref 27.0–33.0)
MCHC: 32.7 g/dL (ref 32.0–36.0)
Monocytes Relative: 7.3 %
Neutro Abs: 8006 cells/uL — ABNORMAL HIGH (ref 1500–7800)
Platelets: 307 10*3/uL (ref 140–400)
RBC: 3.04 10*6/uL — ABNORMAL LOW (ref 3.80–5.10)

## 2022-09-11 IMAGING — MR MR HEAD W/O CM
7 series · 36 of 48 positions shown · non-contrast
Comparison: 02/23/2020 and prior.

CLINICAL DATA: Delirium

EXAM:
MRI HEAD WITHOUT CONTRAST
TECHNIQUE: Multiplanar, multiecho pulse sequences of the brain and surrounding
structures were obtained without intravenous contrast.

[Series 2: DWI · axial · 3.0mm · 0.94mm/px · z∈[-113,+34]mm · 8 of 100 slices shown (1 of 2)]
[im 1/100]
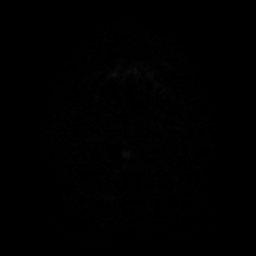
[im 12/100]
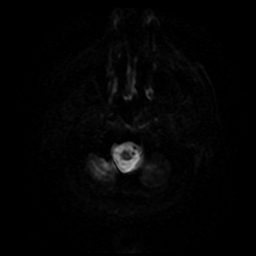
[im 34/100]
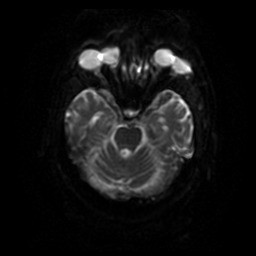
[im 45/100]
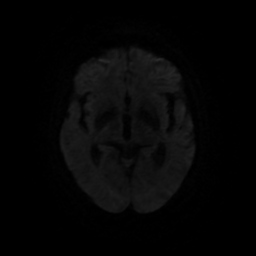
[im 56/100]
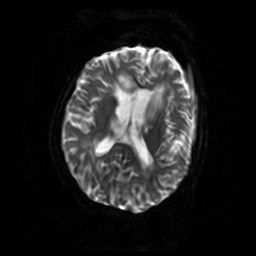
[im 67/100]
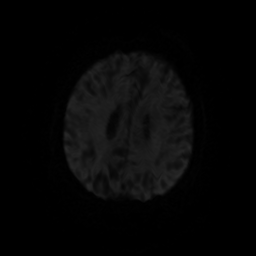
[im 89/100]
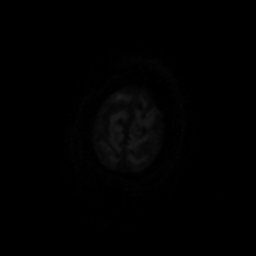
[im 100/100]
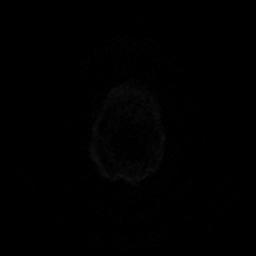

[Series 3: FLAIR · axial · 3.0mm · 0.45mm/px · z∈[-105,+26]mm · 2 of 23 slices shown]
[im 1/23]
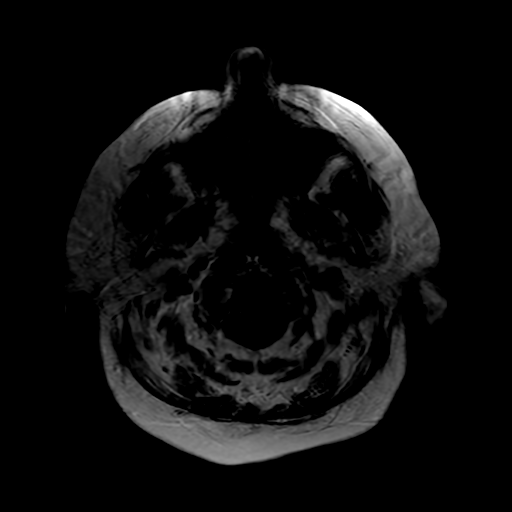
[im 23/23]
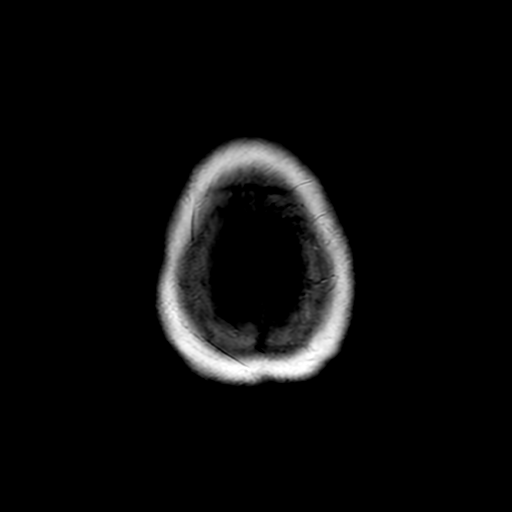

[Series 4: DWI · coronal · 4.0mm · 0.94mm/px · 7 of 74 slices shown (2 of 2)]
[im 1/74]
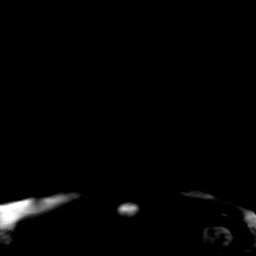
[im 13/74]
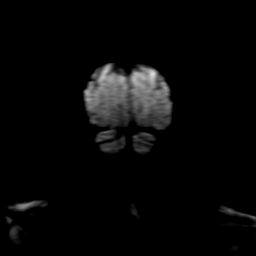
[im 25/74]
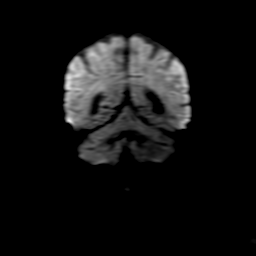
[im 37/74]
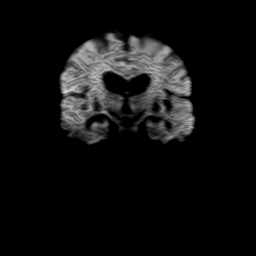
[im 49/74]
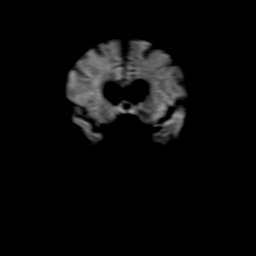
[im 61/74]
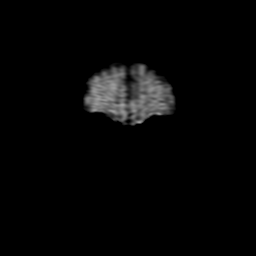
[im 74/74]
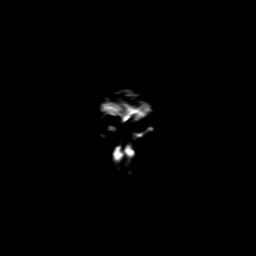

[Series 5: (person_name) · axial · 3.0mm · 0.47mm/px · z∈[-113,+35]mm · 8 of 100 slices shown]
[im 1/100]
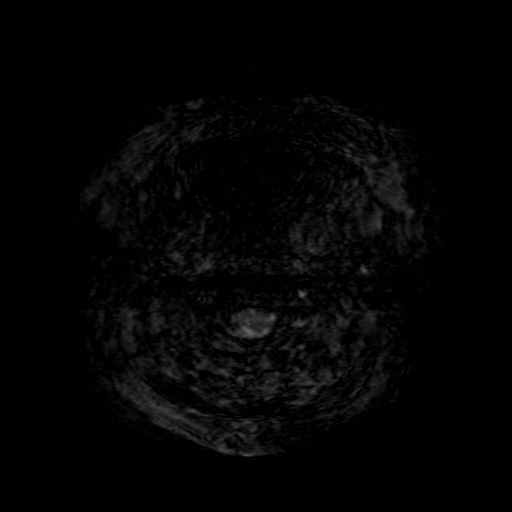
[im 12/100]
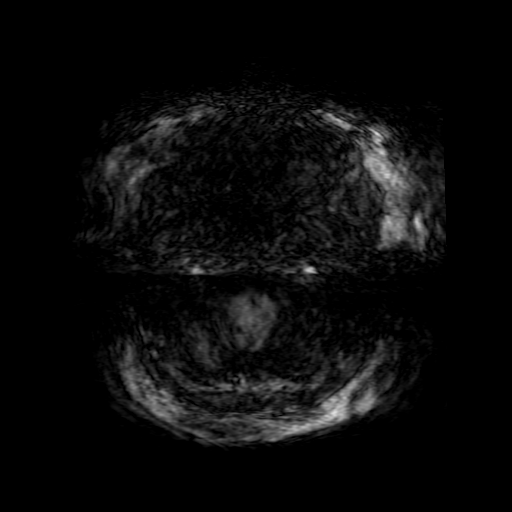
[im 34/100]
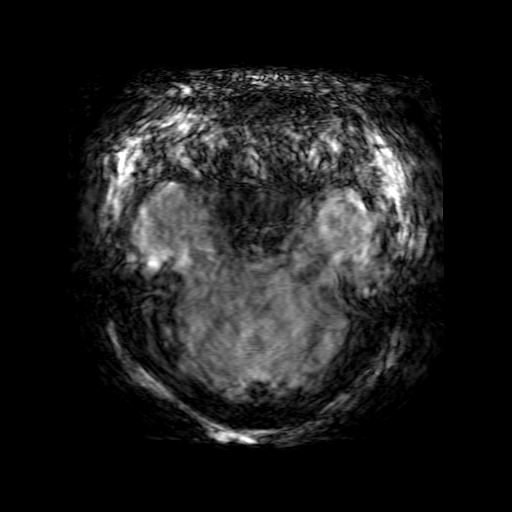
[im 45/100]
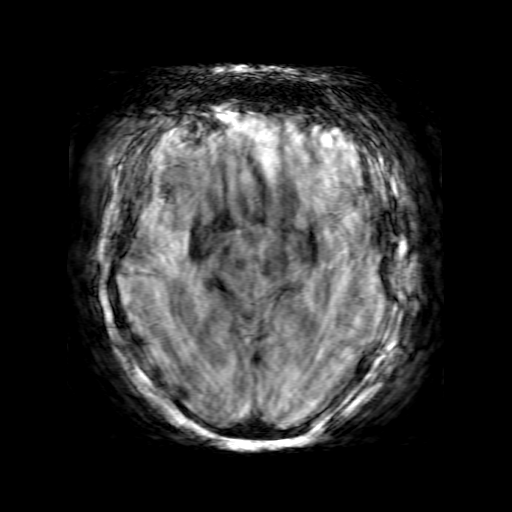
[im 56/100]
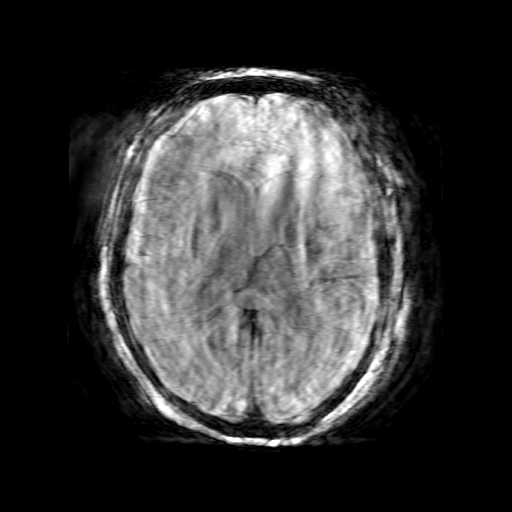
[im 67/100]
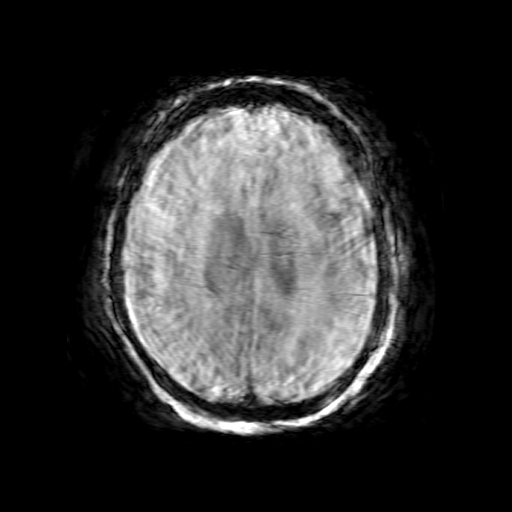
[im 89/100]
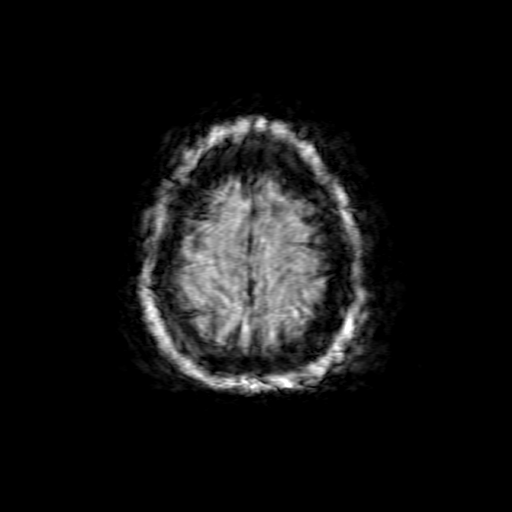
[im 100/100]
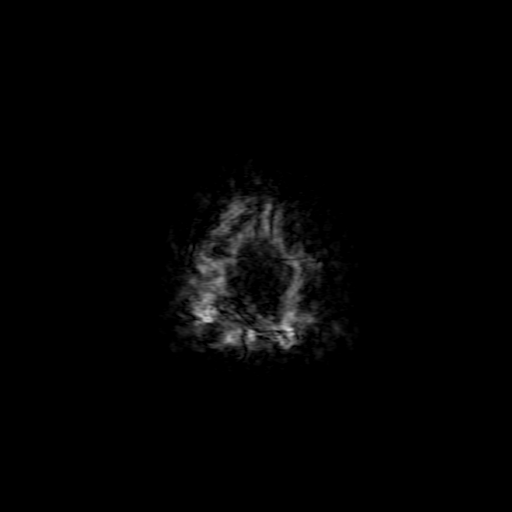

[Series 250: ADC · axial · 3.0mm · 0.94mm/px · z∈[-113,+34]mm · 5 of 50 slices shown (1 of 2)]
[im 1/50]
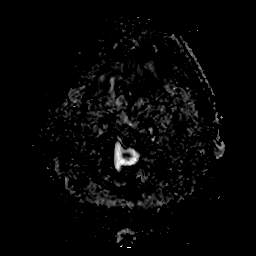
[im 13/50]
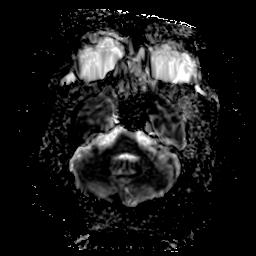
[im 25/50]
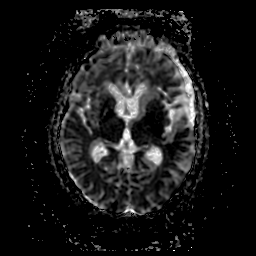
[im 37/50]
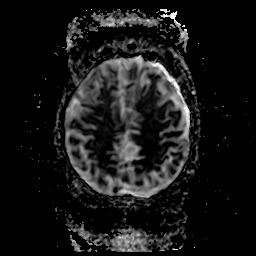
[im 50/50]
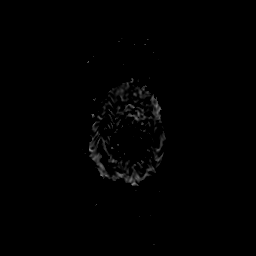

[Series 450: ADC · coronal · 4.0mm · 0.94mm/px · 4 of 37 slices shown (2 of 2)]
[im 1/37]
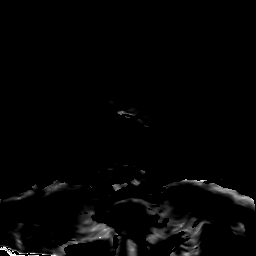
[im 13/37]
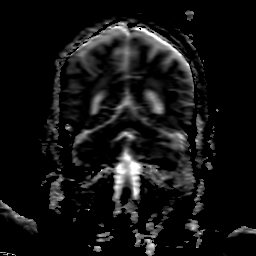
[im 25/37]
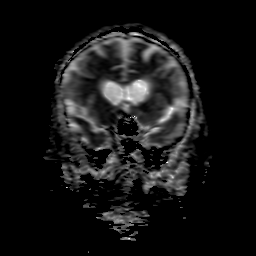
[im 37/37]
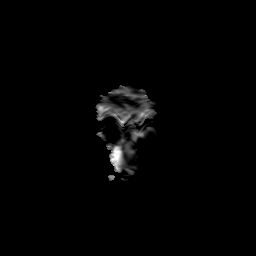

[Series 500: filt_pha: (person_name) · axial · 3.0mm · 0.47mm/px · z∈[-113,-97]mm · 2 of 100 slices shown]
[im 1/100]
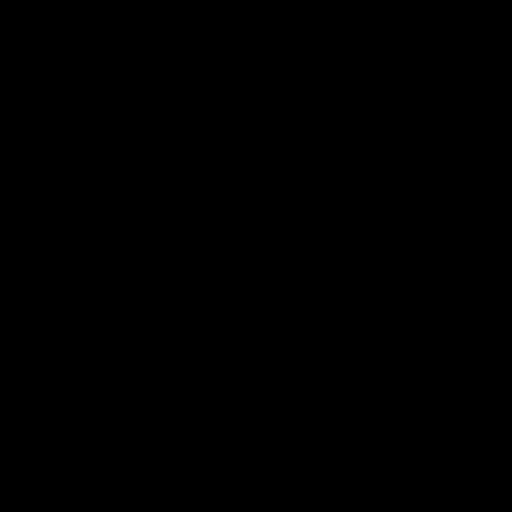
[im 12/100]
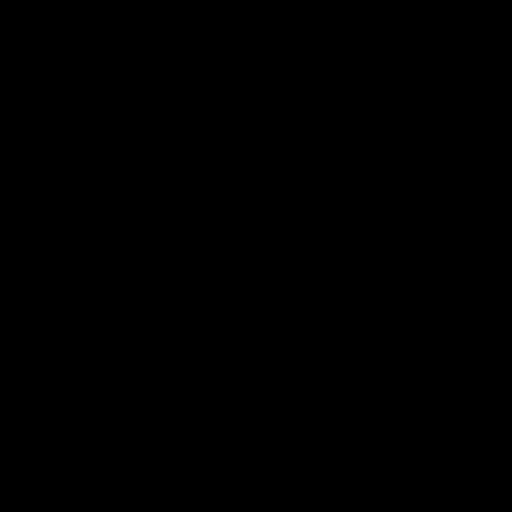

[36 of 48 positions shown; findings below may reference images not displayed]

FINDINGS: Examination was terminated early secondary to patient disposition.
Please note that motion artifact and limited sequence acquisition
limits evaluation.

Brain: No focal diffusion-weighted signal abnormality. No
intracranial hemorrhage. No midline shift, ventriculomegaly or
extra-axial fluid collection. No mass lesion. Mild cerebral atrophy
with ex vacuo dilatation. Mild chronic microvascular ischemic
changes.

Vascular: Not well evaluated on this exam.

Skull and upper cervical spine: Normal marrow signal.

Sinuses/Orbits: Normal orbits. Clear paranasal sinuses. No mastoid
effusion.

Other: None.
IMPRESSION: No acute intracranial process.

Mild cerebral atrophy and chronic microvascular ischemic changes.

Please note that limited sequence acquisition and motion artifact
limits evaluation.

## 2022-09-11 NOTE — Telephone Encounter (Signed)
Left message on machine for pt to contact the office to schedule in office appt for pre op clearance.

## 2022-09-11 NOTE — Telephone Encounter (Signed)
   Name: Ebony Scott  DOB: [birth year]  MRN: [id number]  Primary Cardiologist: Jodelle Red, MD  Chart reviewed as part of pre-operative protocol coverage. Because of Stina Gane Khim's past medical history and time since last visit, she will require a follow-up in-office visit in order to better assess preoperative cardiovascular risk.  Pre-op covering staff: - Please schedule appointment and call patient to inform them. If patient already had an upcoming appointment within acceptable timeframe, please add "pre-op clearance" to the appointment notes so provider is aware. - Please contact requesting surgeon's office via preferred method (i.e, phone, fax) to inform them of need for appointment prior to surgery.  Per Dr. Cristal Deer, "She has many comorbidities, will need a nuclear stress. Can hold plavix but would given aspirin until plavix can be restarted. Given recent ICD, sounds like this will need to be several months from now."  Joylene Grapes, NP  09/11/2022, 10:50 AM

## 2022-09-11 NOTE — Patient Instructions (Signed)
   After Your ICD (Implantable Cardiac Defibrillator)    Monitor your defibrillator site for redness, swelling, and drainage. Call the device clinic at [phone] if you experience these symptoms or fever/chills.  Your incision was closed with Steri-strips or staples:  You may shower 7 days after your procedure and wash your incision with soap and water. Avoid lotions, ointments, or perfumes over your incision until it is well-healed.  You may use a hot tub or a pool after your wound check appointment if the incision is completely closed.  Do not lift, push or pull greater than 10 pounds with the affected arm until 6 weeks after your procedure. UNTIL AFTER AUGUST 19TH. There are no other restrictions in arm movement after your wound check appointment.  Your ICD is designed to protect you from life threatening heart rhythms. Because of this, you may receive a shock.   1 shock with no symptoms:  Call the office during business hours. 1 shock with symptoms (chest pain, chest pressure, dizziness, lightheadedness, shortness of breath, overall feeling unwell):  Call 911. If you experience 2 or more shocks in 24 hours:  Call 911. If you receive a shock, you should not drive.  [place] DMV - no driving for 6 months if you receive appropriate therapy from your ICD.   ICD Alerts:  Some alerts are vibratory and others beep. These are NOT emergencies. Please call our office to let us know. If this occurs at night or on weekends, it can wait until the next business day. Send a remote transmission.  If your device is capable of reading fluid status (for heart failure), you will be offered monthly monitoring to review this with you.   Remote monitoring is used to monitor your ICD from home. This monitoring is scheduled every 91 days by our office. It allows [place] to keep an eye on the functioning of your device to ensure it is working properly. You will routinely see your Electrophysiologist annually (more often  if necessary).

## 2022-09-11 NOTE — Telephone Encounter (Signed)
She has many comorbidities, will need a nuclear stress. Can hold plavix but would given aspirin until plavix can be restarted. Given recent ICD, sounds like this will need to be several months from now.

## 2022-09-11 NOTE — Progress Notes (Signed)
Subjective:    Patient ID: Ebony Scott, female    DOB: [birth year], 72 y.o.   MRN: [id number]  HPI Was recently admitted with sepsis (presumed due to HCAP after ICD placement 7/8) and I copied the discharge summary below for my reference:  Admit date: 08/31/2022 Discharge date: 09/04/2022   Admitted From: Home Disposition:  Home with home health    Recommendations for Outpatient Follow-up:  Follow up with PCP  Follow up with Cardiology as scheduled 7/24    Discharge Condition: Stable CODE STATUS: DNR  Diet recommendation: Heart healthy    Brief/Interim Summary: Ebony Scott is a 72 y.o. female with medical history significant of DVT, remote breast cancer, bronchiectasis, chronic systolic CHF, fibromyalgia, GAVE, HLD, hypothyroidism, and RA presenting with fatigue and poor PO intake following AICD insertion Monday (7/8).   Since then, she has been lethargic with poor PO intake, "low grade fever" <100.4 at home.  She had 1 episode of bilious emesis a few days ago.  Chronic cough from bronchiectasis without overt change.  No frank urinary symptoms other than 1 episode of incontinence (has made very little urine).    Due to fever of 101.9, patient was started on empiric antibiotics to cover for HCAP.  Patient had clinical improvement.  She had another fever of 101.  However, she did not have any new symptoms indicating another source of infection.  Blood cultures remained negative.  She remained afebrile for 24 hours prior to discharge.  She remained stable on room air.  Leukocytosis resolved.  Creatinine remained stable.  She was discharged home on azithromycin and cefadroxil to complete treatment.   Discharge Diagnoses:    Principal Problem:   Fever Active Problems:   Coronary artery calcification seen on CAT scan   Chronic systolic CHF (congestive heart failure) (HCC)   Hyperlipidemia   Severe persistent asthma   Chronic kidney disease, stage 3b (HCC)   History of pulmonary embolus  (PE)   DMII (diabetes mellitus, type 2) (HCC)   Acute kidney injury superimposed on chronic kidney disease (HCC)   Lethargy   Hypotension   DNR (do not resuscitate)    Fever, concern for HCAP -UA negative -COVID, influenza negative -MRSA PCR negative -Blood cultures negative to date -Strep pneumo antigen negative, sputum culture was not acceptable for testing -Chest x-ray reviewed independently, small opacity left lung base, could be atelectasis versus early pneumonia -Empiric antibiotics to treat possible HCAP, Rocephin and azithromycin, in setting of fever and immunocompromise status --> cefadroxil and azithromycin for discharge   AKI on CKD stage IIIb -In setting of Entresto and Jardiance use -Baseline creatinine 1.1-1.4 -Improved, creatinine 1.30   Chronic systolic CHF -EF <69 -Resume Entresto, Jardiance   Status post ICD -EP following -ICD insertion site without erythema or sign of infection   CAD Hyperlipidemia -Status post LAD DES in 12/2020 -Plavix, Lipitor, Bidil    Bronchiectasis, asthma -Continue breathing treatments and nebs   History of PE/DVT -Xarelto    Diabetes mellitus, with peripheral neuropathy -A1c 6.3 in April    Rheumatoid arthritis -On Hizentra, daily prednisone -Voltaren gel as needed   09/11/22 Patient appears to be back to her baseline.  She has had occasional loose stools since discharge from the hospital but she denies any foul odor or severe diarrhea.  She has had 1 loose stool today.  She denies any abdominal pain.  She denies any fevers.  She denies any shortness of breath beyond her baseline, purulent sputum,  pleurisy, or hemoptysis.  She denies any dysuria or urgency or frequency.  She does complain of pain over her ICD site.  I evaluated that area today and there is no evidence of cellulitis.  There is no fluctuance under the skin.  There is no warmth or erythema. Past Medical History:  Diagnosis Date   Anemia    Angio-edema     Breast cancer (HCC) 1998   Left breast, in remission   Bronchiectasis (HCC)    CHF (congestive heart failure) (HCC)    Clostridium difficile colitis 01/14/2018   Coronary artery disease    Depression    "some; don't take anything for it" (02/20/2012), pt denies dx as of 06/02/21   Diverticulosis    DVT (deep venous thrombosis) (HCC)    Fibromyalgia 11/2011   GAVE (gastric antral vascular ectasia)    GERD (gastroesophageal reflux disease)    Headache(784.0)    "related to allergies; more at different times during the year" (02/20/2012)   Hemorrhoids    Hiatal hernia    back and neck   History of blood transfusion 2023   1 unit, 5 units of Iron   Hx of adenomatous colonic polyps 04/12/2016   Hypercholesteremia    good cholesterol is high   Hypothyroidism    h/o Graves disease   IBS (irritable bowel syndrome)    Moderate persistent asthma    -FeV1 72% 2011, -IgE 102 2011, CT sinus Neg 2011   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 Casimer Lanius   Sleep apnea    uses cpap nightly   Tracheobronchomalacia    Past Surgical History:  Procedure Laterality Date   ABDOMINAL HYSTERECTOMY N/A    Phreesia 10/21/2019   ANTERIOR AND POSTERIOR REPAIR  1990's   APPENDECTOMY     ARGON LASER APPLICATION  02/05/2021   Procedure: ARGON LASER APPLICATION;  Surgeon: Benancio Deeds, MD;  Location: [facility];  Service: Gastroenterology;;   BREAST LUMPECTOMY  1998   left   BREAST SURGERY N/A    Phreesia 10/21/2019   BRONCHIAL WASHINGS  04/05/2020   Procedure: BRONCHIAL WASHINGS;  Surgeon: Martina Sinner, MD;  Location: [facility];  Service: Pulmonary;;   CAPSULOTOMY Right 08/28/2021   Procedure: CAPSULOTOMY;  Surgeon: Candelaria Stagers, DPM;  Location: [facility];  Service: Podiatry;  Laterality: Right;   CARPOMETACARPEL (CMC) FUSION  OF THUMB WITH AUTOGRAFT FROM RADIUS  ~ 2009   "both thumbs" (02/20/2012)   CATARACT EXTRACTION W/ INTRAOCULAR LENS  IMPLANT, BILATERAL  2012   CERVICAL DISCECTOMY  10/2001   C5-C6   CERVICAL FUSION  2003   C3-C4   CHOLECYSTECTOMY     COLONOSCOPY     CORONARY STENT INTERVENTION N/A 01/11/2021   Procedure: CORONARY STENT INTERVENTION;  Surgeon: Marykay Lex, MD;  [facility] INVASIVE CV LAB;  Service: Cardiovascular;  Laterality: N/A;   DEBRIDEMENT TENNIS ELBOW  ?1970's   right   ESOPHAGOGASTRODUODENOSCOPY     ESOPHAGOGASTRODUODENOSCOPY (EGD) WITH PROPOFOL N/A 02/05/2021   Procedure: ESOPHAGOGASTRODUODENOSCOPY (EGD) WITH PROPOFOL;  Surgeon: Benancio Deeds, MD;  Location: [facility];  Service: Gastroenterology;  Laterality: N/A;   EYE SURGERY N/A    Phreesia 10/21/2019   HAMMER TOE SURGERY Right 08/28/2021   Procedure: HAMMER TOE REPAIR 2ND TOE RIGHT FOOT;  Surgeon: Candelaria Stagers,  DPM;  Location: [facility];  Service: Podiatry;  Laterality: Right;   HYSTERECTOMY     I & D EXTREMITY Right 09/22/2020   Procedure: IRRIGATION AND DEBRIDEMENT RIGHT HAND AND ELBOW;  Surgeon: Teryl Lucy, MD;  Location: [facility];  Service: Orthopedics;  Laterality: Right;   ICD IMPLANT N/A 08/27/2022   Procedure: ICD IMPLANT;  Surgeon: Maurice Small, MD;  Location: [facility];  Service: Cardiovascular;  Laterality: N/A;   KNEE ARTHROPLASTY  ?1990's   "?right; w/cartilage repair" (02/20/2012)   MASS EXCISION Left 08/28/2021   Procedure: EXCISION OF SOFT TISSUE  MASS LEFT FOOT;  Surgeon: Candelaria Stagers, DPM;  Location: [facility];  Service: Podiatry;  Laterality: Left;   NASAL SEPTUM SURGERY  1980's   POSTERIOR CERVICAL FUSION/FORAMINOTOMY  2004   "failed initial fusion; rewired  anterior neck" (02/20/2012)   REVERSE SHOULDER ARTHROPLASTY Right 02/16/2020   Procedure: REVERSE SHOULDER ARTHROPLASTY;  Surgeon: Teryl Lucy, MD;  Location: [facility];  Service: Orthopedics;  Laterality: Right;   RIGHT/LEFT  HEART CATH AND CORONARY ANGIOGRAPHY N/A 01/11/2021   Procedure: RIGHT/LEFT HEART CATH AND CORONARY ANGIOGRAPHY;  Surgeon: Marykay Lex, MD;  Location: [facility] INVASIVE CV LAB;  Service: Cardiovascular;  Laterality: N/A;   SPINE SURGERY N/A    Phreesia 10/21/2019   TONSILLECTOMY  ~ 1953   VESICOVAGINAL FISTULA CLOSURE W/ TAH  1988   VIDEO BRONCHOSCOPY Bilateral 08/23/2016   Procedure: VIDEO BRONCHOSCOPY WITH FLUORO;  Surgeon: Roslynn Amble, MD;  Location: [facility];  Service: Cardiopulmonary;  Laterality: Bilateral;   VIDEO BRONCHOSCOPY N/A 04/05/2020   Procedure: VIDEO BRONCHOSCOPY WITHOUT FLUORO;  Surgeon: Martina Sinner, MD;  Location: [facility];  Service: Pulmonary;  Laterality: N/A;   Current Outpatient Medications on File Prior to Visit  Medication Sig Dispense Refill   acetaminophen (TYLENOL) 500 MG tablet Take 500 mg by mouth as needed for moderate pain or fever.     acetic acid-hydrocortisone (VOSOL-HC) OTIC solution Place 4 drops into both ears See admin instructions. Chloramp/sulfu/ampho/ampho/B/H drop 100-100-5 mg Instill 4 drops into both ears the first 5 days of the month, then as directed/as needed for itching     albuterol (VENTOLIN HFA) 108 (90 Base) MCG/ACT inhaler Inhale 2 puffs into the lungs every 6 (six) hours as needed for wheezing or shortness of breath. 19 g 1   allopurinol (ZYLOPRIM) 100 MG tablet TAKE 1 TABLET BY MOUTH DAILY (Patient taking differently: Take 100 mg by mouth daily with supper.) 90 tablet 2   Alpha-D-Galactosidase (BEANO PO) Take 2-3 tablets by mouth 3 (three) times daily as needed (Gas).     Alpha-Lipoic Acid 600 MG TABS Take 600 mg by mouth daily.     atorvastatin (LIPITOR) 80 MG tablet TAKE 1 TABLET BY MOUTH DAILY (Patient taking differently: Take 80 mg by mouth daily with supper.) 90 tablet 2   azelastine (ASTELIN) 0.1 % nasal spray Place 2 sprays into both nostrils daily. Use in each nostril as directed (Patient taking differently: Place 2  sprays into both nostrils at bedtime as needed for allergies or rhinitis. Use in each nostril as directed) 30 mL 5   benzonatate (TESSALON) 200 MG capsule TAKE 1 CAPSULE BY MOUTH THREE TIMES DAILY AS NEEDED FOR COUGH (Patient taking differently: Take 200 mg by mouth 2 (two) times daily as needed for cough.) 30 capsule 0   budesonide (PULMICORT) 0.5 MG/2ML nebulizer solution Take 2 mLs (0.5 mg total) by nebulization 2 (two) times daily. 120 mL  11   Calcium Citrate (CITRACAL PO) Take 1 tablet by mouth daily. With magnesium     cetirizine (ZYRTEC) 10 MG tablet Take 1 tablet (10 mg total) by mouth 2 (two) times daily as needed for allergies (Can take a n extra dose during flare ups.). (Patient taking differently: Take 10 mg by mouth at bedtime.) 60 tablet 5   Cholecalciferol (VITAMIN D3) 25 MCG (1000 UT) capsule Take 1,000 Units by mouth at bedtime.     clopidogrel (PLAVIX) 75 MG tablet Take 1 tablet (75 mg total) by mouth daily. 90 tablet 3   cyproheptadine (PERIACTIN) 4 MG tablet Take 1 tablet (4 mg total) by mouth 3 (three) times daily as needed for allergies. (Patient taking differently: Take 12 mg by mouth at bedtime.) 90 tablet 5   denosumab (PROLIA) 60 MG/ML SOSY injection Inject 60 mg into the skin every 6 (six) months.     dexlansoprazole (DEXILANT) 60 MG capsule Take 60 mg by mouth daily.     empagliflozin (JARDIANCE) 10 MG TABS tablet Take 1 tablet (10 mg total) by mouth daily before breakfast. 90 tablet 1   EPINEPHrine 0.3 mg/0.3 mL IJ SOAJ injection USE AS DIRECTED BY YOUR PHYSICIAN INTRAMUSCULARLY AS NEEDED FOR ANAPHYLAXIS (Patient taking differently: Inject 0.3 mg into the muscle as needed for anaphylaxis.) 2 each 1   famotidine (PEPCID) 20 MG tablet Take 1 tablet (20 mg total) by mouth daily. TAKE 1 TABLET BY MOUTH EACH NIGHT AT BEDTIME Strength: 20 mg 30 tablet 5   folic acid (FOLVITE) 1 MG tablet Take 1 tablet (1 mg total) by mouth daily. 90 tablet 3   furosemide (LASIX) 40 MG tablet  DAILY AS NEEDED FOR WEIGHT GAIN OF 2 POUNDS OVERNIGHT OR 5 POUNDS IN ONE WEEK (Patient not taking: Reported on 09/10/2022) 90 tablet 2   gabapentin (NEURONTIN) 300 MG capsule Take 1 capsule (300 mg total) by mouth at bedtime. (Patient taking differently: Take 300 mg by mouth at bedtime. Can take up to three time a say as needed)     GOODSENSE ARTHRITIS PAIN 1 % GEL Apply 1 Application topically 4 times daily as needed for pain. (Patient taking differently: Diclofenac) 100 g 3   guaiFENesin (MUCINEX) 600 MG 12 hr tablet Take 600 mg by mouth 2 (two) times daily.     HIZENTRA 10 GM/50ML SOLN Inject 65 mLs into the skin once a week. 13 g Infusion     ipratropium-albuterol (DUONEB) 0.5-2.5 (3) MG/3ML SOLN Take 3 mLs by nebulization every 6 (six) hours as needed. 360 mL 6   isosorbide-hydrALAZINE (BIDIL) 20-37.5 MG tablet Take 1 tablet by mouth 2 (two) times daily. 180 tablet 3   Lactase (LACTOSE FAST ACTING RELIEF PO) Take 3 tablets by mouth 3 (three) times daily before meals. For dairy food or drink     lidocaine-prilocaine (EMLA) cream Apply 1 application  topically as needed (pain).     magnesium oxide (MAG-OX) 400 (240 Mg) MG tablet Take 400 mg by mouth at bedtime.     mirtazapine (REMERON SOL-TAB) 30 MG disintegrating tablet Take 1 tablet (30 mg total) by mouth at bedtime as needed (sleep). (Patient taking differently: Take 30 mg by mouth at bedtime.) 90 tablet 3   montelukast (SINGULAIR) 10 MG tablet Take 1 tablet (10 mg total) by mouth daily after supper. 90 tablet 1   mupirocin ointment (BACTROBAN) 2 % Apply 1 Application topically at bedtime.     Nystatin (GERHARDT'S BUTT CREAM) CREA Apply topically 3  times a day as needed for itching or irritation 4 each 6   nystatin (MYCOSTATIN) 100000 UNIT/ML suspension Use as directed 15 mLs in the mouth or throat at bedtime as needed (if has yeast in mouth).     nystatin-triamcinolone ointment (MYCOLOG) Apply 1 Application topically daily as needed (Vaginal  irritation).     predniSONE (DELTASONE) 1 MG tablet Take 2 tablets (2 mg total) by mouth daily with breakfast. Take along with 5 mg tablet daily. (Patient taking differently: Take 2 mg by mouth See admin instructions. Take along with 5 mg for a total of 7 mg in the morning) 180 tablet 0   predniSONE (DELTASONE) 5 MG tablet Take 1 tablet (5 mg total) by mouth daily with breakfast. (Patient taking differently: Take 5 mg by mouth See admin instructions. Take with (2) 1 mg for a total of 7 mg in the morning) 90 tablet 0   Prenatal Vit-Fe Fumarate-FA (PRENATAL VITAMINS PO) Take 1 tablet by mouth daily.     Probiotic Product (ALIGN) 4 MG CAPS Take 4 mg by mouth daily.     Propylene Glycol (SYSTANE COMPLETE) 0.6 % SOLN Place 1 drop into both eyes 2 (two) times daily. 15 mL 5   sacubitril-valsartan (ENTRESTO) 24-26 MG Take 1 tablet by mouth 2 (two) times daily. 180 tablet 1   Tiotropium Bromide-Olodaterol (STIOLTO RESPIMAT) 2.5-2.5 MCG/ACT AERS Inhale 2 puffs into the lungs daily. (Patient taking differently: Inhale 2 puffs into the lungs at bedtime.) 4 g 11   traMADol (ULTRAM) 50 MG tablet Take 1 tablet (50 mg total) by mouth at bedtime. 30 tablet 0   Triamcinolone Acetonide (TRIAMCINOLONE 0.1 % CREAM : EUCERIN) CREA Apply 1 Application topically 2 (two) times daily. (Patient taking differently: Apply 1 Application topically daily as needed for rash or irritation.) 1 each 1   triamcinolone ointment (KENALOG) 0.5 % Apply 1 Application topically 2 (two) times daily. 30 g 1   XARELTO 20 MG TABS tablet TAKE 1 TABLET BY MOUTH DAILY WITH SUPPER (Patient taking differently: Take 20 mg by mouth daily with supper.) 90 tablet 2   Current Facility-Administered Medications on File Prior to Visit  Medication Dose Route Frequency Provider Last Rate Last Admin   iohexol (OMNIPAQUE) 350 MG/ML injection    PRN Marykay Lex, MD   105 mL at 01/11/21 0955   Allergies  Allergen Reactions   Baclofen Anaphylaxis and Other  (See Comments)    Altered mental status requiring a 3-day hospital stay- patient became unresponsive   "Put me in a coma for five days", Altered mental status requiring a 3-day hospital stay- patient became unresponsive   Dust Mite Extract Shortness Of Breath and Other (See Comments)    "sneezing" (02/20/2012) too   Molds & Smuts Shortness Of Breath   Morphine And Codeine Hives and Itching   Other Shortness Of Breath and Other (See Comments)    Grass and weeds "sneezing; filled sinuses" (02/20/2012)   Penicillins Rash and Other (See Comments)    "welts" (02/20/2012)  Tolerated Ancef 02/16/20   Rofecoxib Swelling and Other (See Comments)    Vioxx- feet swelling   Shellfish Allergy Anaphylaxis, Shortness Of Breath, Itching and Rash   Shrimp Extract Anaphylaxis and Other (See Comments)    ALL SHELLFISH   Tetracycline Hcl Nausea And Vomiting   Tetracycline Hcl Other (See Comments)    GI Intolerance   Xolair [Omalizumab] Other (See Comments)    Caused Blood clot   Zoledronic Acid Other (  See Comments)    Reclast- Fever, Put in hospital, dr said it was a reaction from a reaction    Fever, inability to walk, Put in hospital  Fever, Put in hospital, dr said it was a reaction from a reaction , Other reaction(s): Unknown   Dilaudid [Hydromorphone Hcl] Itching   Hydrocodone-Acetaminophen Itching   Levofloxacin Other (See Comments)    GI upset  Other Reaction(s): GI Intolerance, Other (See Comments)  REACTION: GI upset, GI upset   Oxycodone Hcl Nausea And Vomiting   Oxycodone Hcl Other (See Comments)    GI Intolerance   Paroxetine Nausea And Vomiting and Other (See Comments)    GI Intolerance also   Celecoxib Swelling and Other (See Comments)    Feet swelling   Diltiazem Hcl Swelling   Lactose Other (See Comments)    Bloating and gas   Lactose Intolerance (Gi) Other (See Comments)    Bloating and gas   Oxycodone-Acetaminophen Itching   Rituximab Other (See Comments)    Anemia     Tree Extract Other (See Comments)    "tested and told I was allergic to it; never experienced a reaction to it" (02/20/2012)   Gamunex [Immune Globulin] Itching and Rash    Tolerates hizentra    Penicillin G Procaine Rash and Other (See Comments)    Welts, also   Social History   Socioeconomic History   Marital status: Divorced    Spouse name: Not on file   Number of children: 2   Years of education: college   Highest education level: Not on file  Occupational History   Occupation: Disabled    Comment: Retired Dispensing optician: RETIRED  Tobacco Use   Smoking status: Never    Passive exposure: Past   Smokeless tobacco: Never   Tobacco comments:    Parents  Vaping Use   Vaping status: Never Used  Substance and Sexual Activity   Alcohol use: Not Currently    Alcohol/week: 0.0 standard drinks of alcohol   Drug use: No   Sexual activity: Not Currently    Birth control/protection: Surgical    Comment: Hysterectomy  Other Topics Concern   Not on file  Social History Narrative   Patient lives at home alone. Patient  divorced.    Patient has her BS degree.   Right handed.   Caffeine- sometimes coffee.      [facility]:   Born in [place], [place]. She worked as a Armed forces operational officer. She has no pets currently. She does have indoor plants. Previously had mold in her home that was remediated. Carpet was removed.          Social Determinants of Health   Financial Resource Strain: Low Risk  (04/18/2022)   Overall Financial Resource Strain (CARDIA)    Difficulty of Paying Living Expenses: Not hard at all  Food Insecurity: No Food Insecurity (09/06/2022)   Hunger Vital Sign    Worried About Running Out of Food in the Last Year: Never true    Ran Out of Food in the Last Year: Never true  Transportation Needs: No Transportation Needs (09/06/2022)   PRAPARE - Administrator, Civil Service (Medical): No    Lack of Transportation (Non-Medical): No   Physical Activity: Insufficiently Active (07/13/2021)   Exercise Vital Sign    Days of Exercise per Week: 5 days    Minutes of Exercise per Session: 20 min  Stress: No Stress Concern Present (07/13/2021)  [employer] of Occupational Health - Occupational Stress Questionnaire    Feeling of Stress : Only a little  Social Connections: Moderately Isolated (07/13/2021)   Social Connection and Isolation Panel [NHANES]    Frequency of Communication with Friends and Family: More than three times a week    Frequency of Social Gatherings with Friends and Family: More than three times a week    Attends Religious Services: 1 to 4 times per year    Active Member of [employer] or Organizations: No    Attends Banker Meetings: Never    Marital Status: Divorced  Catering manager Violence: Not At Risk (08/31/2022)   Humiliation, Afraid, Rape, and Kick questionnaire    Fear of Current or Ex-Partner: No    Emotionally Abused: No    Physically Abused: No    Sexually Abused: No     Review of Systems     Objective:   Physical Exam Vitals reviewed.  Constitutional:      General: She is not in acute distress.    Appearance: Normal appearance. She is not ill-appearing or toxic-appearing.  HENT:     Nose: No congestion or rhinorrhea.  Cardiovascular:     Rate and Rhythm: Normal rate and regular rhythm.     Heart sounds: Normal heart sounds. No murmur heard. Pulmonary:     Effort: Pulmonary effort is normal. No respiratory distress.     Breath sounds: No stridor or decreased air movement. No wheezing, rhonchi or rales.  Abdominal:     General: Bowel sounds are normal. There is no distension.     Palpations: Abdomen is soft.     Tenderness: There is no abdominal tenderness. There is no guarding.  Musculoskeletal:     Right lower leg: No edema.     Left lower leg: No edema.  Skin:    Findings: No erythema.  Neurological:     Mental Status: She is alert and oriented to person,  place, and time.     Cranial Nerves: No cranial nerve deficit.     Sensory: No sensory deficit.     Motor: No weakness.     Coordination: Coordination normal.        Assessment & Plan:  HFrEF (heart failure with reduced ejection fraction) (HCC) - Plan: Hemoglobin A1c, CBC with Differential/Platelet, COMPLETE METABOLIC PANEL WITH GFR, Lipid panel  Type 2 diabetes mellitus with other specified complication, unspecified whether long term insulin use (HCC) - Plan: Hemoglobin A1c, CBC with Differential/Platelet, COMPLETE METABOLIC PANEL WITH GFR, Lipid panel  Stage 3b chronic kidney disease (HCC) - Plan: Hemoglobin A1c, CBC with Differential/Platelet, COMPLETE METABOLIC PANEL WITH GFR, Lipid panel  Seronegative rheumatoid arthritis (HCC)  Pneumonia of left lower lobe due to infectious organism - Plan: DG Chest 2 View Patient recently had sepsis presumed to be secondary to pneumonia.  There is no evidence of any residual infection today on exam.  Review of systems is negative except for her baseline chronic joint pain, fatigue, and baseline shortness of breath.  I would like to get a chest x-ray to ensure resolution.  I recommended checking an A1c to monitor her glycemic control.  I will check a CBC to evaluate for any evidence of leukocytosis or severe anemia and monitor a CMP.  Obtain a lipid panel.  The patient's goal LDL cholesterol is less than 70.  Continue Jardiance for congestive heart failure.  There is no evidence that she had urosepsis.

## 2022-09-12 ENCOUNTER — Ambulatory Visit: Payer: Medicare PPO | Attending: Internal Medicine

## 2022-09-12 DIAGNOSIS — I429 Cardiomyopathy, unspecified: Secondary | ICD-10-CM | POA: Diagnosis not present

## 2022-09-12 LAB — HEMOGLOBIN A1C
Hgb A1c MFr Bld: 6.2 % of total Hgb — ABNORMAL HIGH (ref <5.7)
Mean Plasma Glucose: 131 mg/dL
eAG (mmol/L): 7.3 mmol/L

## 2022-09-12 LAB — CBC WITH DIFFERENTIAL/PLATELET
Absolute Monocytes: 701 cells/uL (ref 200–950)
Basophils Relative: 0.4 %
Eosinophils Absolute: 115 cells/uL (ref 15–500)
Eosinophils Relative: 1.2 %
Hemoglobin: 10.2 g/dL — ABNORMAL LOW (ref 11.7–15.5)
MCV: 102.6 fL — ABNORMAL HIGH (ref 80.0–100.0)
MPV: 10.9 fL (ref 7.5–12.5)
Neutrophils Relative %: 83.4 %
RDW: 13 % (ref 11.0–15.0)
Total Lymphocyte: 7.7 %
WBC: 9.6 10*3/uL (ref 3.8–10.8)

## 2022-09-12 LAB — CUP PACEART INCLINIC DEVICE CHECK
Battery Remaining Longevity: 122 mo
Brady Statistic RV Percent Paced: 0 %
Date Time Interrogation Session: 20240724201923
HighPow Impedance: 66.375
Implantable Lead Connection Status: 753985
Implantable Lead Implant Date: 20240708
Implantable Lead Location: 753860
Implantable Pulse Generator Implant Date: 20240708
Lead Channel Impedance Value: 500 Ohm
Lead Channel Pacing Threshold Amplitude: 0.5 V
Lead Channel Pacing Threshold Amplitude: 0.5 V
Lead Channel Pacing Threshold Pulse Width: 0.5 ms
Lead Channel Pacing Threshold Pulse Width: 0.5 ms
Lead Channel Sensing Intrinsic Amplitude: 12 mV
Lead Channel Setting Pacing Amplitude: 3.5 V
Lead Channel Setting Pacing Pulse Width: 0.5 ms
Lead Channel Setting Sensing Sensitivity: 0.5 mV
Pulse Gen Serial Number: 211016307
Zone Setting Status: 755011

## 2022-09-12 LAB — COMPLETE METABOLIC PANEL WITH GFR
AG Ratio: 1.3 (calc) (ref 1.0–2.5)
ALT: 33 U/L — ABNORMAL HIGH (ref 6–29)
AST: 31 U/L (ref 10–35)
Albumin: 3.7 g/dL (ref 3.6–5.1)
Alkaline phosphatase (APISO): 71 U/L (ref 37–153)
BUN/Creatinine Ratio: 22 (calc) (ref 6–22)
BUN: 33 mg/dL — ABNORMAL HIGH (ref 7–25)
CO2: 24 mmol/L (ref 20–32)
Calcium: 9.4 mg/dL (ref 8.6–10.4)
Chloride: 107 mmol/L (ref 98–110)
Creat: 1.51 mg/dL — ABNORMAL HIGH (ref 0.60–1.00)
Globulin: 2.9 g/dL (calc) (ref 1.9–3.7)
Glucose, Bld: 211 mg/dL — ABNORMAL HIGH (ref 65–99)
Potassium: 5.2 mmol/L (ref 3.5–5.3)
Sodium: 140 mmol/L (ref 135–146)
Total Bilirubin: 0.3 mg/dL (ref 0.2–1.2)
Total Protein: 6.6 g/dL (ref 6.1–8.1)
eGFR: 36 mL/min/{1.73_m2} — ABNORMAL LOW (ref >=60)

## 2022-09-12 LAB — LIPID PANEL
Cholesterol: 123 mg/dL (ref <200)
HDL: 56 mg/dL (ref >=50)
LDL Cholesterol (Calc): 39 mg/dL (calc)
Non-HDL Cholesterol (Calc): 67 mg/dL (calc) (ref <130)
Total CHOL/HDL Ratio: 2.2 (calc) (ref <5.0)
Triglycerides: 223 mg/dL — ABNORMAL HIGH (ref <150)

## 2022-09-12 NOTE — Progress Notes (Signed)
Wound check appointment. Steri-strips removed. Wound without redness or edema. Incision edges approximated, wound well healed. Normal device function. Thresholds, sensing, and impedances consistent with implant measurements. Device programmed at 3.5V for extra safety margin until 3 month visit. Histogram distribution appropriate for patient and level of activity. No mode switches or ventricular arrhythmias noted. Patient educated about wound care, arm mobility, lifting restrictions, shock plan. ROV in 3 months with implanting physician. ? ? ?

## 2022-09-13 DIAGNOSIS — E1122 Type 2 diabetes mellitus with diabetic chronic kidney disease: Secondary | ICD-10-CM | POA: Diagnosis not present

## 2022-09-13 DIAGNOSIS — Z7984 Long term (current) use of oral hypoglycemic drugs: Secondary | ICD-10-CM | POA: Diagnosis not present

## 2022-09-13 DIAGNOSIS — Z86718 Personal history of other venous thrombosis and embolism: Secondary | ICD-10-CM | POA: Diagnosis not present

## 2022-09-13 DIAGNOSIS — N1832 Chronic kidney disease, stage 3b: Secondary | ICD-10-CM | POA: Diagnosis not present

## 2022-09-13 DIAGNOSIS — I2510 Atherosclerotic heart disease of native coronary artery without angina pectoris: Secondary | ICD-10-CM | POA: Diagnosis not present

## 2022-09-13 DIAGNOSIS — I5022 Chronic systolic (congestive) heart failure: Secondary | ICD-10-CM | POA: Diagnosis not present

## 2022-09-13 DIAGNOSIS — I959 Hypotension, unspecified: Secondary | ICD-10-CM | POA: Diagnosis not present

## 2022-09-13 DIAGNOSIS — J45909 Unspecified asthma, uncomplicated: Secondary | ICD-10-CM | POA: Diagnosis not present

## 2022-09-13 DIAGNOSIS — M797 Fibromyalgia: Secondary | ICD-10-CM | POA: Diagnosis not present

## 2022-09-13 IMAGING — DX DG CHEST 1V
1 series · 1 of 1 positions shown · non-contrast
Comparison: February 24, 2020

CLINICAL DATA: Shortness of breath

EXAM:
CHEST  1 VIEW

[chest]
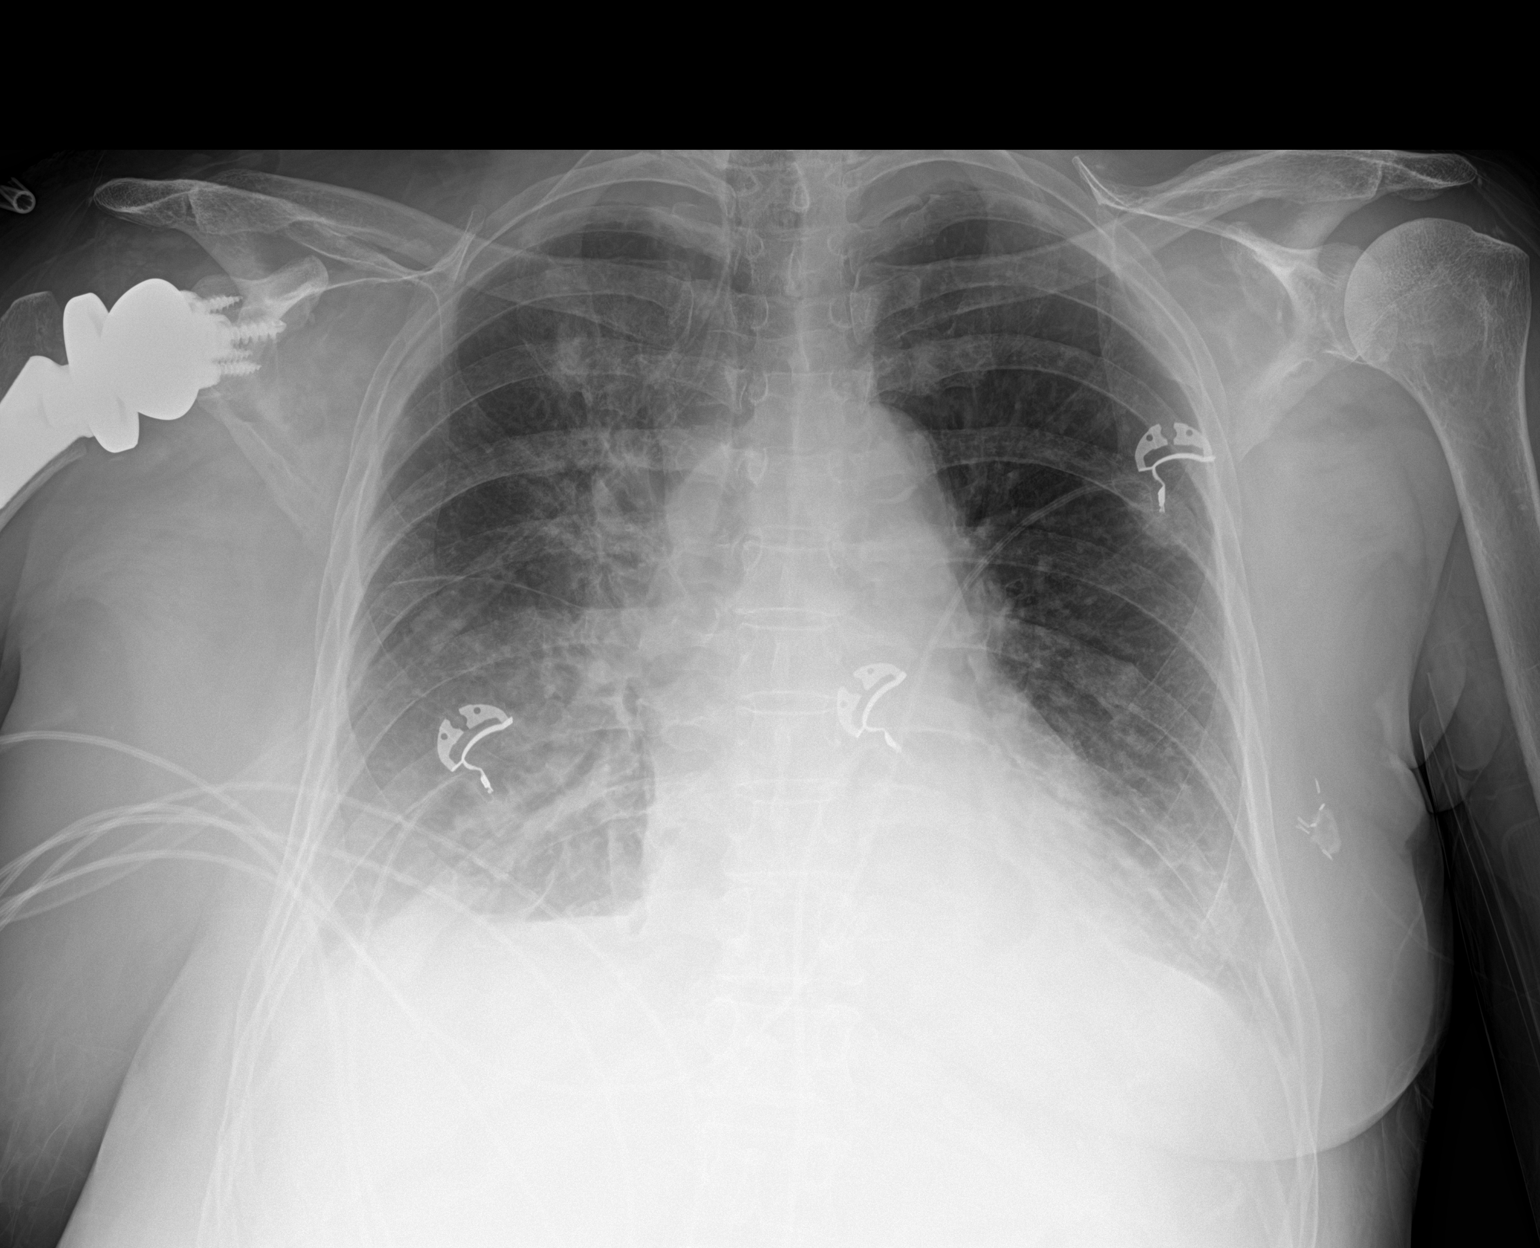

[1 of 1 positions shown; findings below may reference images not displayed]

FINDINGS: Haziness over the lung bases favored represent layering effusions
and underlying opacities. The heart, hila, and mediastinum are
normal. No pneumothorax. No other acute abnormalities.
IMPRESSION: Layering effusions with underlying opacities, likely atelectasis. No
other acute abnormalities.

## 2022-09-14 ENCOUNTER — Encounter: Payer: Self-pay | Admitting: Family Medicine

## 2022-09-14 DIAGNOSIS — M0600 Rheumatoid arthritis without rheumatoid factor, unspecified site: Secondary | ICD-10-CM | POA: Diagnosis not present

## 2022-09-14 DIAGNOSIS — G894 Chronic pain syndrome: Secondary | ICD-10-CM | POA: Diagnosis not present

## 2022-09-14 DIAGNOSIS — I5032 Chronic diastolic (congestive) heart failure: Secondary | ICD-10-CM | POA: Diagnosis not present

## 2022-09-14 DIAGNOSIS — J45909 Unspecified asthma, uncomplicated: Secondary | ICD-10-CM | POA: Diagnosis not present

## 2022-09-14 DIAGNOSIS — G4733 Obstructive sleep apnea (adult) (pediatric): Secondary | ICD-10-CM | POA: Diagnosis not present

## 2022-09-14 DIAGNOSIS — R0602 Shortness of breath: Secondary | ICD-10-CM | POA: Diagnosis not present

## 2022-09-14 DIAGNOSIS — J4540 Moderate persistent asthma, uncomplicated: Secondary | ICD-10-CM | POA: Diagnosis not present

## 2022-09-14 DIAGNOSIS — J471 Bronchiectasis with (acute) exacerbation: Secondary | ICD-10-CM | POA: Diagnosis not present

## 2022-09-14 DIAGNOSIS — G4736 Sleep related hypoventilation in conditions classified elsewhere: Secondary | ICD-10-CM | POA: Diagnosis not present

## 2022-09-15 IMAGING — CR DG SHOULDER 2+V PORT*R*
1 series · 1 of 1 positions shown · non-contrast
Comparison: Plain films right shoulder 02/16/2020 and 02/23/2020.
CT shoulder 02/08/2020.

CLINICAL DATA: Right shoulder pain. Patient status post reverse
shoulder arthroplasty 02/16/2020.

EXAM:
PORTABLE RIGHT SHOULDER

[shoulder ap neutral]
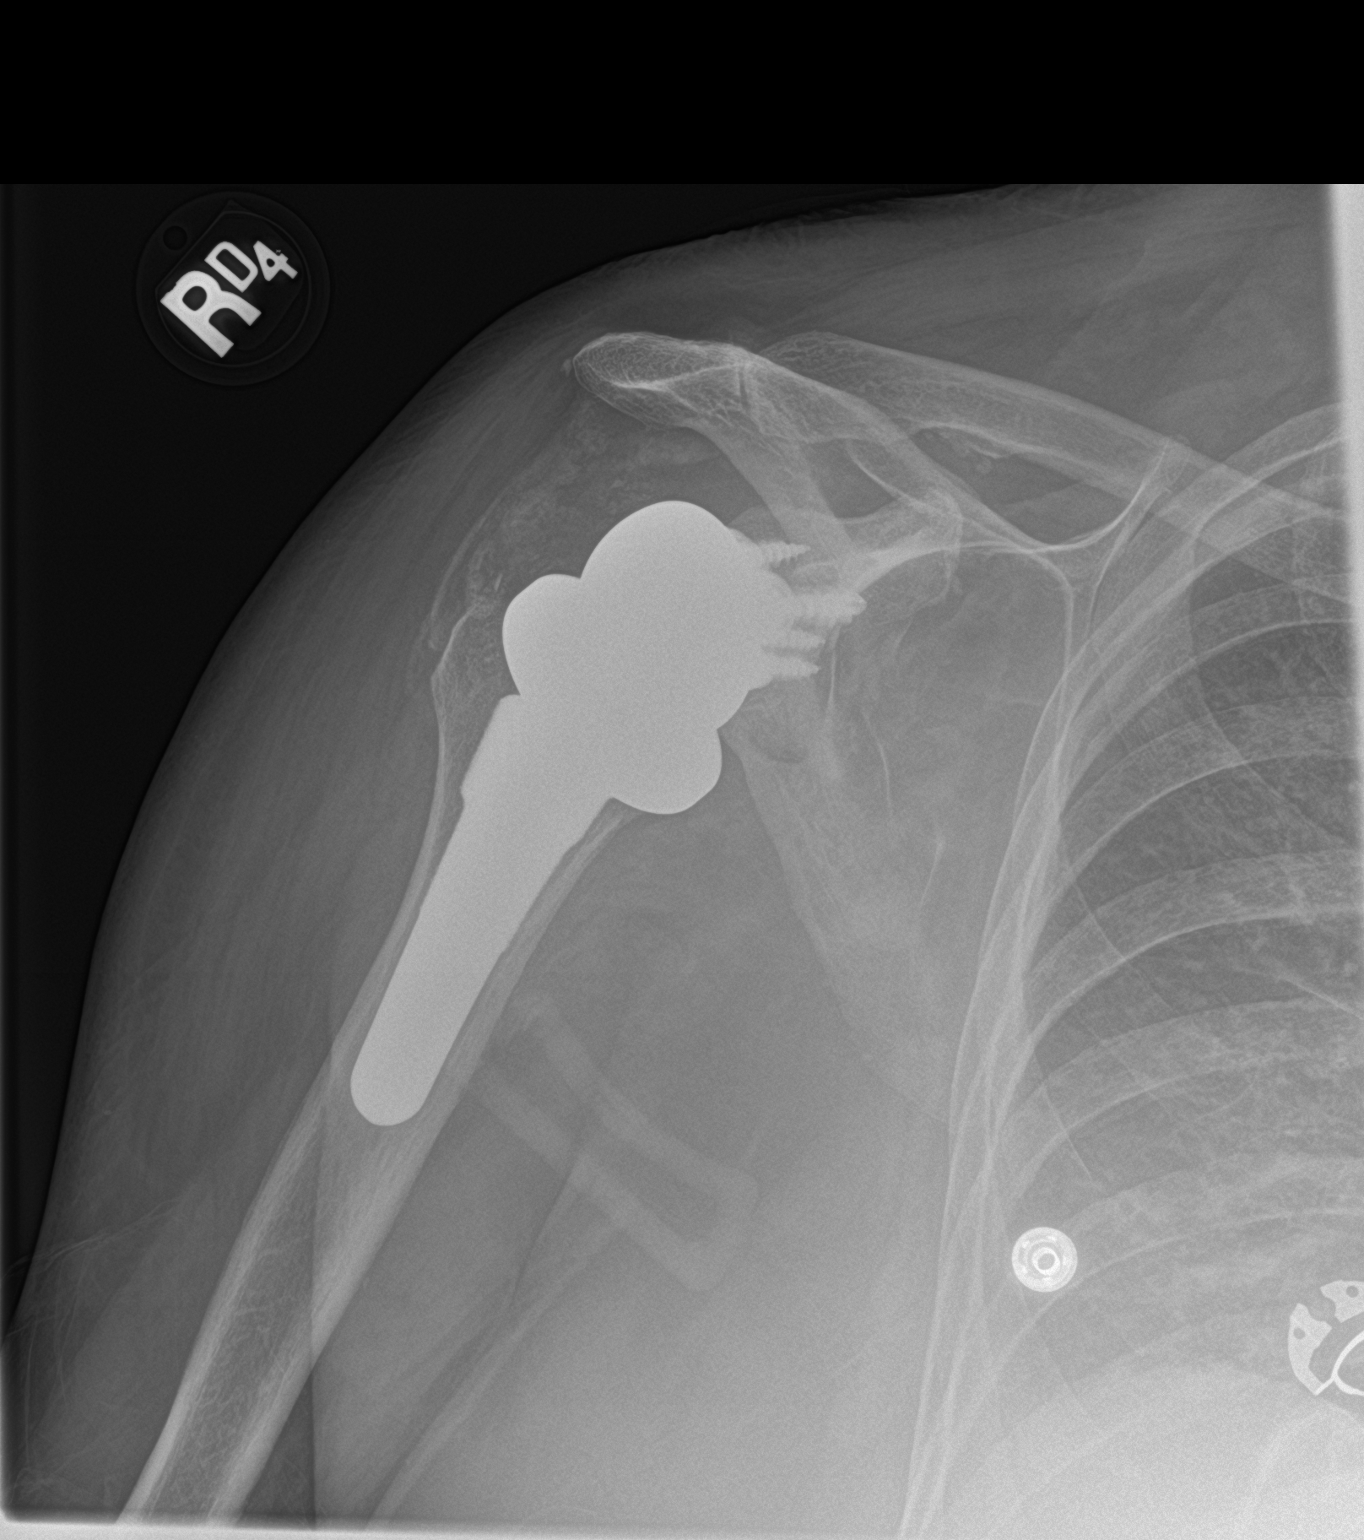

[1 of 1 positions shown; findings below may reference images not displayed]

FINDINGS: Reverse shoulder arthroplasty remains in place. The device is
located. No fracture. Soft tissue calcifications are unchanged.
IMPRESSION: Status post right shoulder arthroplasty.  No acute finding.

## 2022-09-18 ENCOUNTER — Other Ambulatory Visit: Payer: Self-pay | Admitting: Family Medicine

## 2022-09-18 DIAGNOSIS — J45909 Unspecified asthma, uncomplicated: Secondary | ICD-10-CM | POA: Diagnosis not present

## 2022-09-18 DIAGNOSIS — Z7984 Long term (current) use of oral hypoglycemic drugs: Secondary | ICD-10-CM | POA: Diagnosis not present

## 2022-09-18 DIAGNOSIS — Z86718 Personal history of other venous thrombosis and embolism: Secondary | ICD-10-CM | POA: Diagnosis not present

## 2022-09-18 DIAGNOSIS — I959 Hypotension, unspecified: Secondary | ICD-10-CM | POA: Diagnosis not present

## 2022-09-18 DIAGNOSIS — M797 Fibromyalgia: Secondary | ICD-10-CM | POA: Diagnosis not present

## 2022-09-18 DIAGNOSIS — I2510 Atherosclerotic heart disease of native coronary artery without angina pectoris: Secondary | ICD-10-CM | POA: Diagnosis not present

## 2022-09-18 DIAGNOSIS — N1832 Chronic kidney disease, stage 3b: Secondary | ICD-10-CM | POA: Diagnosis not present

## 2022-09-18 DIAGNOSIS — I5022 Chronic systolic (congestive) heart failure: Secondary | ICD-10-CM | POA: Diagnosis not present

## 2022-09-18 DIAGNOSIS — E1122 Type 2 diabetes mellitus with diabetic chronic kidney disease: Secondary | ICD-10-CM | POA: Diagnosis not present

## 2022-09-18 IMAGING — CR DG CHEST 2V
2 series · 2 of 2 positions shown · non-contrast
Comparison: February 27, 2020.

CLINICAL DATA: Productive cough.

EXAM:
CHEST - 2 VIEW

[w chest pa]
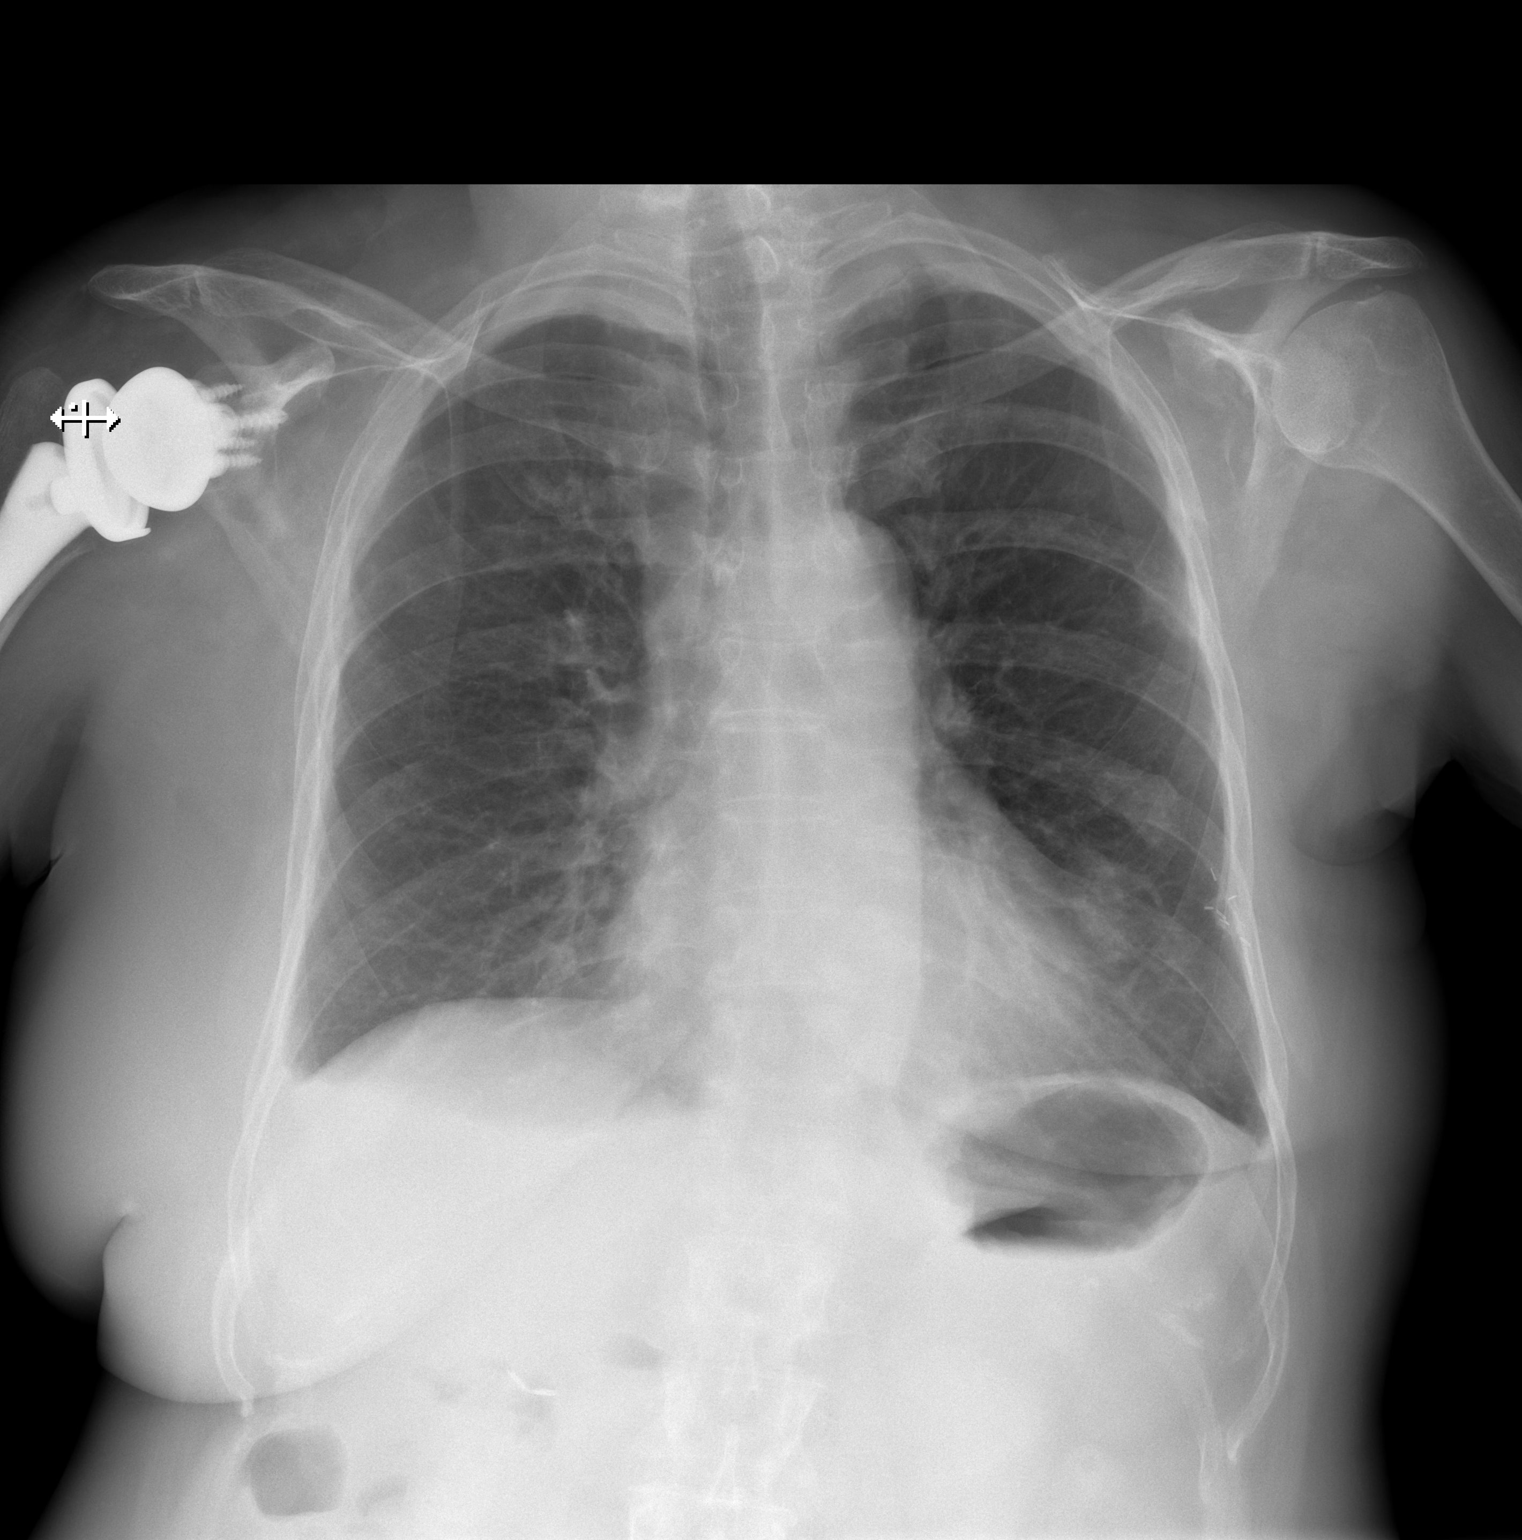

[w chest lat]
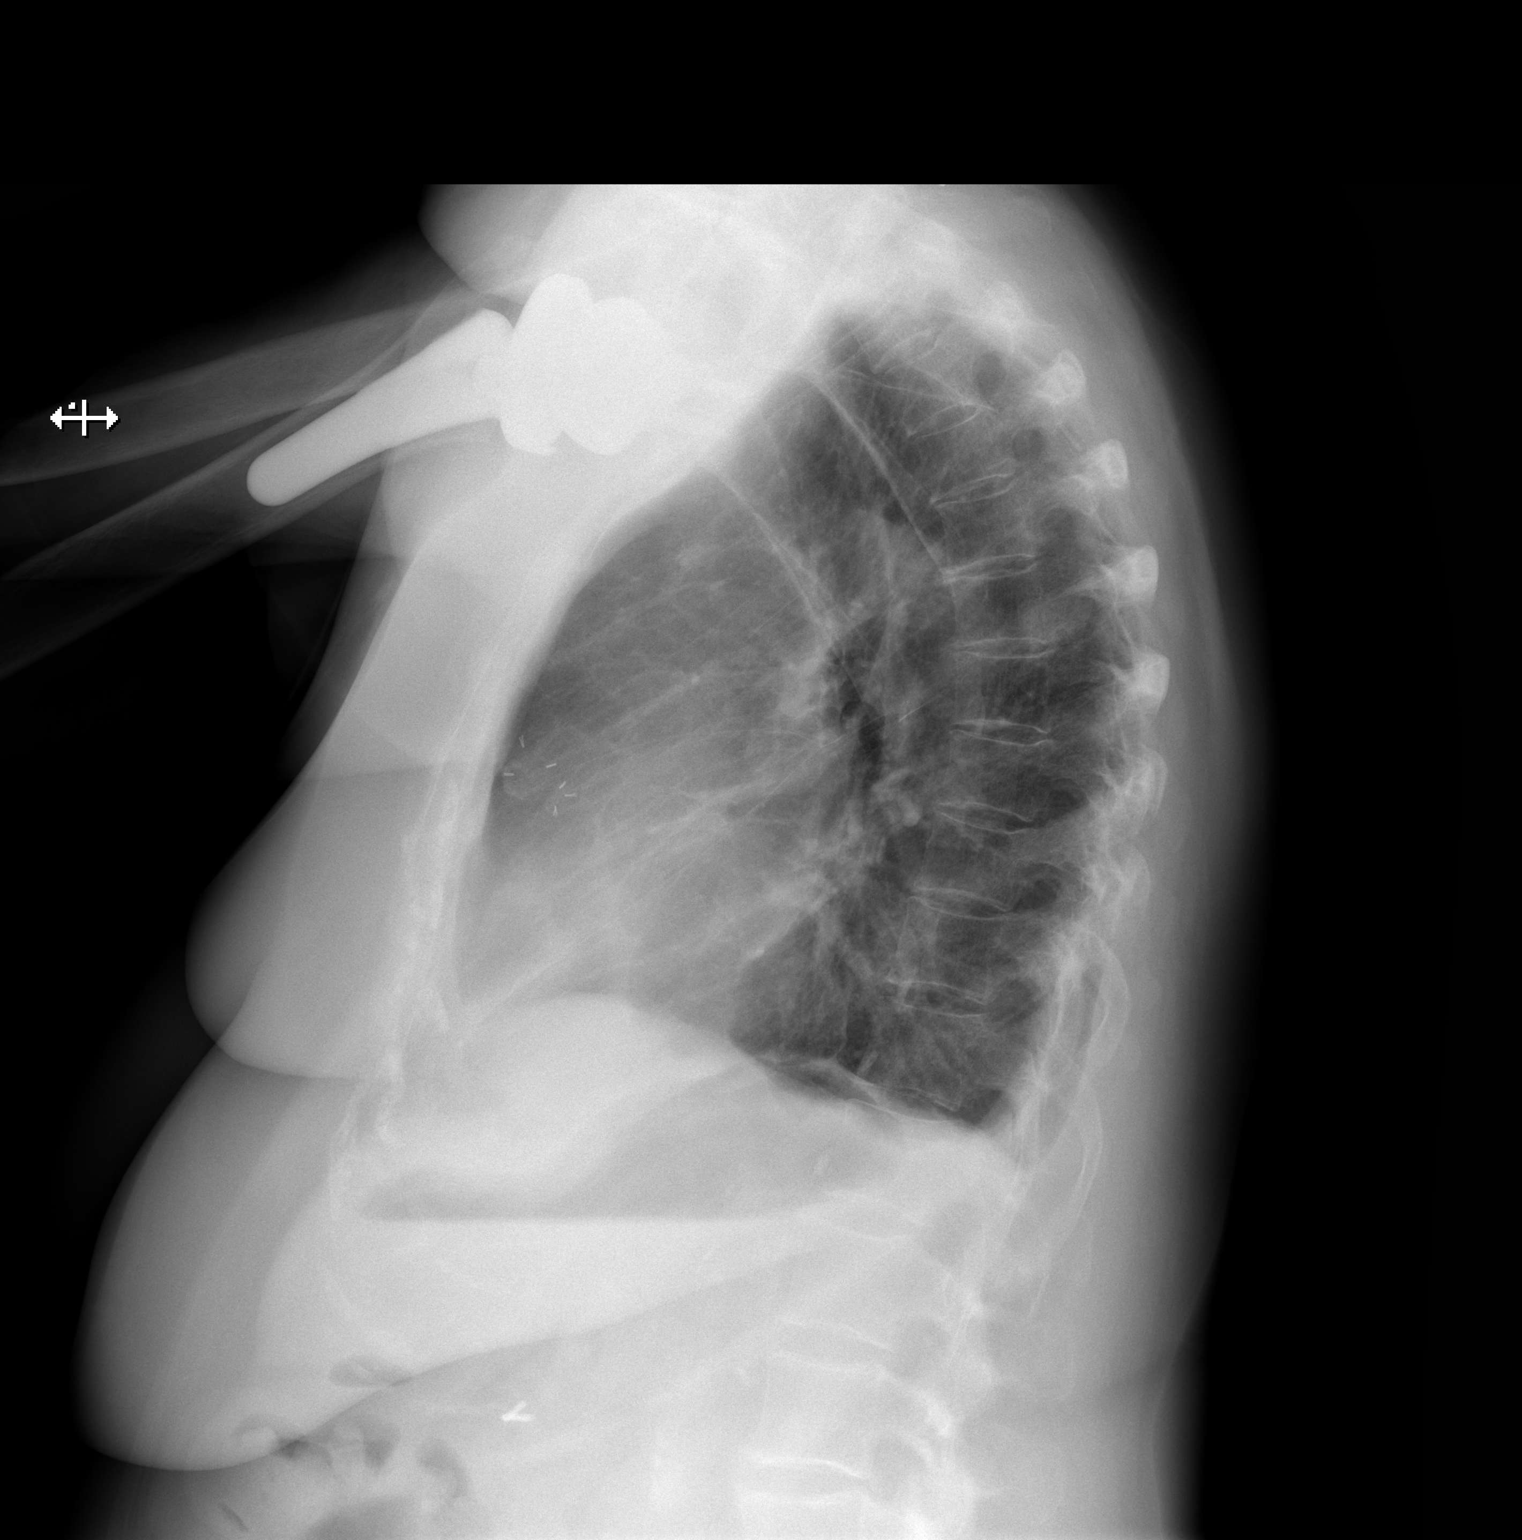

[2 of 2 positions shown; findings below may reference images not displayed]

FINDINGS: The heart size and mediastinal contours are within normal limits. No
pneumothorax is noted. Small right pleural effusion is noted. Left
lung is clear. No consolidative process is noted. The visualized
skeletal structures are unremarkable.
IMPRESSION: Small right pleural effusion.

## 2022-09-21 DIAGNOSIS — N1832 Chronic kidney disease, stage 3b: Secondary | ICD-10-CM | POA: Diagnosis not present

## 2022-09-21 DIAGNOSIS — E1122 Type 2 diabetes mellitus with diabetic chronic kidney disease: Secondary | ICD-10-CM | POA: Diagnosis not present

## 2022-09-21 DIAGNOSIS — I5022 Chronic systolic (congestive) heart failure: Secondary | ICD-10-CM | POA: Diagnosis not present

## 2022-09-21 DIAGNOSIS — I959 Hypotension, unspecified: Secondary | ICD-10-CM | POA: Diagnosis not present

## 2022-09-21 DIAGNOSIS — I2510 Atherosclerotic heart disease of native coronary artery without angina pectoris: Secondary | ICD-10-CM | POA: Diagnosis not present

## 2022-09-21 DIAGNOSIS — Z7984 Long term (current) use of oral hypoglycemic drugs: Secondary | ICD-10-CM | POA: Diagnosis not present

## 2022-09-21 DIAGNOSIS — J45909 Unspecified asthma, uncomplicated: Secondary | ICD-10-CM | POA: Diagnosis not present

## 2022-09-21 DIAGNOSIS — Z86718 Personal history of other venous thrombosis and embolism: Secondary | ICD-10-CM | POA: Diagnosis not present

## 2022-09-21 DIAGNOSIS — M797 Fibromyalgia: Secondary | ICD-10-CM | POA: Diagnosis not present

## 2022-09-24 DIAGNOSIS — E1122 Type 2 diabetes mellitus with diabetic chronic kidney disease: Secondary | ICD-10-CM | POA: Diagnosis not present

## 2022-09-24 DIAGNOSIS — N1832 Chronic kidney disease, stage 3b: Secondary | ICD-10-CM | POA: Diagnosis not present

## 2022-09-24 DIAGNOSIS — J45909 Unspecified asthma, uncomplicated: Secondary | ICD-10-CM | POA: Diagnosis not present

## 2022-09-24 DIAGNOSIS — M797 Fibromyalgia: Secondary | ICD-10-CM | POA: Diagnosis not present

## 2022-09-24 DIAGNOSIS — Z86718 Personal history of other venous thrombosis and embolism: Secondary | ICD-10-CM | POA: Diagnosis not present

## 2022-09-24 DIAGNOSIS — I5022 Chronic systolic (congestive) heart failure: Secondary | ICD-10-CM | POA: Diagnosis not present

## 2022-09-24 DIAGNOSIS — I2510 Atherosclerotic heart disease of native coronary artery without angina pectoris: Secondary | ICD-10-CM | POA: Diagnosis not present

## 2022-09-24 DIAGNOSIS — I959 Hypotension, unspecified: Secondary | ICD-10-CM | POA: Diagnosis not present

## 2022-09-24 DIAGNOSIS — Z7984 Long term (current) use of oral hypoglycemic drugs: Secondary | ICD-10-CM | POA: Diagnosis not present

## 2022-09-25 ENCOUNTER — Telehealth: Payer: Self-pay | Admitting: Family Medicine

## 2022-09-25 DIAGNOSIS — J4551 Severe persistent asthma with (acute) exacerbation: Secondary | ICD-10-CM | POA: Diagnosis not present

## 2022-09-25 NOTE — Telephone Encounter (Signed)
Received FMLA paperwork via fax from [employer] for patient's daughter Ebony Scott. Paperwork placed on nurse's desk.  When completed, please contact patient at [phone] to collect form completion fee of $29. Once payment received, please fax to [employer] at [phone] (number listed at top of page 2).   Please notify patient's daughter Ebony Scott after fax confirmation received; unsure if she wants originals mailed to her or if she will pick them up (works nights).

## 2022-09-27 DIAGNOSIS — Z0279 Encounter for issue of other medical certificate: Secondary | ICD-10-CM

## 2022-10-01 ENCOUNTER — Other Ambulatory Visit: Payer: Medicare PPO

## 2022-10-02 DIAGNOSIS — I2510 Atherosclerotic heart disease of native coronary artery without angina pectoris: Secondary | ICD-10-CM | POA: Diagnosis not present

## 2022-10-02 DIAGNOSIS — N1832 Chronic kidney disease, stage 3b: Secondary | ICD-10-CM | POA: Diagnosis not present

## 2022-10-02 DIAGNOSIS — I5022 Chronic systolic (congestive) heart failure: Secondary | ICD-10-CM | POA: Diagnosis not present

## 2022-10-02 DIAGNOSIS — Z7984 Long term (current) use of oral hypoglycemic drugs: Secondary | ICD-10-CM | POA: Diagnosis not present

## 2022-10-02 DIAGNOSIS — I959 Hypotension, unspecified: Secondary | ICD-10-CM | POA: Diagnosis not present

## 2022-10-02 DIAGNOSIS — E1122 Type 2 diabetes mellitus with diabetic chronic kidney disease: Secondary | ICD-10-CM | POA: Diagnosis not present

## 2022-10-02 DIAGNOSIS — J45909 Unspecified asthma, uncomplicated: Secondary | ICD-10-CM | POA: Diagnosis not present

## 2022-10-02 DIAGNOSIS — M797 Fibromyalgia: Secondary | ICD-10-CM | POA: Diagnosis not present

## 2022-10-02 DIAGNOSIS — Z86718 Personal history of other venous thrombosis and embolism: Secondary | ICD-10-CM | POA: Diagnosis not present

## 2022-10-07 ENCOUNTER — Other Ambulatory Visit: Payer: Self-pay | Admitting: Allergy and Immunology

## 2022-10-07 DIAGNOSIS — E119 Type 2 diabetes mellitus without complications: Secondary | ICD-10-CM

## 2022-10-08 DIAGNOSIS — Z86718 Personal history of other venous thrombosis and embolism: Secondary | ICD-10-CM | POA: Diagnosis not present

## 2022-10-08 DIAGNOSIS — Z7984 Long term (current) use of oral hypoglycemic drugs: Secondary | ICD-10-CM | POA: Diagnosis not present

## 2022-10-08 DIAGNOSIS — E1122 Type 2 diabetes mellitus with diabetic chronic kidney disease: Secondary | ICD-10-CM | POA: Diagnosis not present

## 2022-10-08 DIAGNOSIS — I5022 Chronic systolic (congestive) heart failure: Secondary | ICD-10-CM | POA: Diagnosis not present

## 2022-10-08 DIAGNOSIS — I959 Hypotension, unspecified: Secondary | ICD-10-CM | POA: Diagnosis not present

## 2022-10-08 DIAGNOSIS — I2510 Atherosclerotic heart disease of native coronary artery without angina pectoris: Secondary | ICD-10-CM | POA: Diagnosis not present

## 2022-10-08 DIAGNOSIS — N1832 Chronic kidney disease, stage 3b: Secondary | ICD-10-CM | POA: Diagnosis not present

## 2022-10-08 DIAGNOSIS — J45909 Unspecified asthma, uncomplicated: Secondary | ICD-10-CM | POA: Diagnosis not present

## 2022-10-08 DIAGNOSIS — M797 Fibromyalgia: Secondary | ICD-10-CM | POA: Diagnosis not present

## 2022-10-08 NOTE — Telephone Encounter (Signed)
Completed paperwork successfully faxed to [employer] on 8/9 with a time stamp of 10-08-2022 14:35.  Paperwork also successfully faxed to [facility] with a time stamp of 08-09-204 15:33.  Patient called to follow up on paperwork; stated they notified her after 8/9 they they hadn't received it.  Will resend to [facility] via fax at [phone].

## 2022-10-10 IMAGING — CT CT CHEST W/O CM
1 series · 14 of 34 positions shown, 18 images · non-contrast
Comparison: August 28, 2019

CLINICAL DATA: Cough, persistent abnormal x-ray with nodule seen on
radiograph from March 21, 2020.

EXAM:
CT CHEST WITHOUT CONTRAST
TECHNIQUE: Multidetector CT imaging of the chest was performed following the
standard protocol without IV contrast.

[Series 2: chest w/(date) · axial · 0.63mm/px · z∈[-272,-14]mm · 14 of 153 slices shown, 18 images]
[im 12/153  mediastinal]
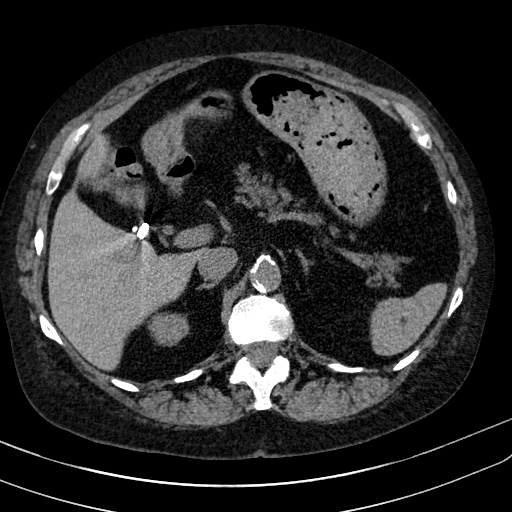
[im 12/153  lung]
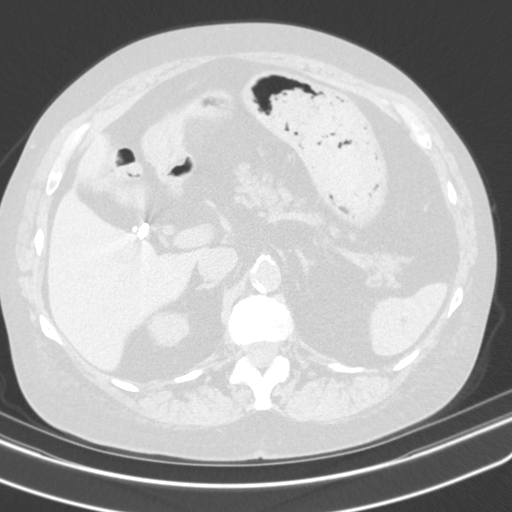
[im 23/153  lung]
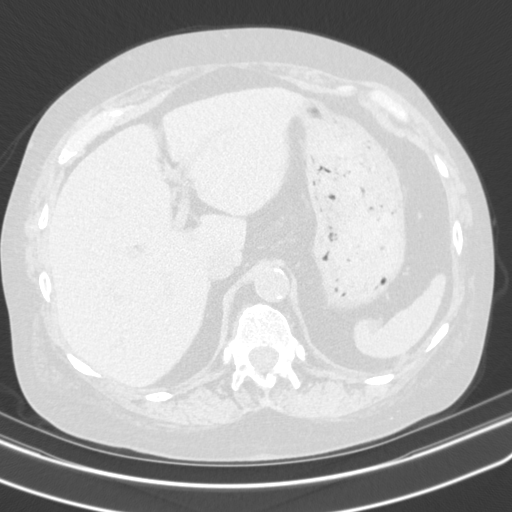
[im 31/153  lung]
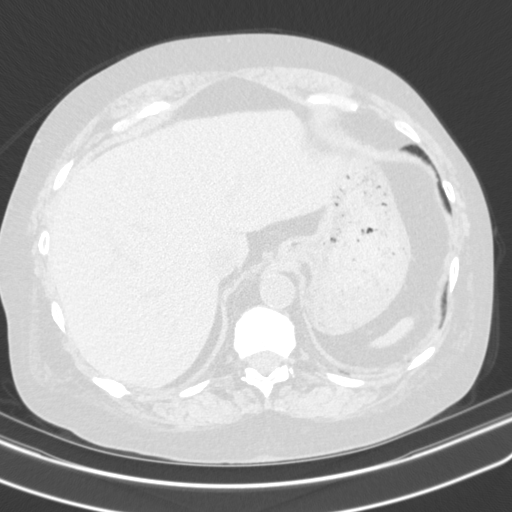
[im 46/153  lung]
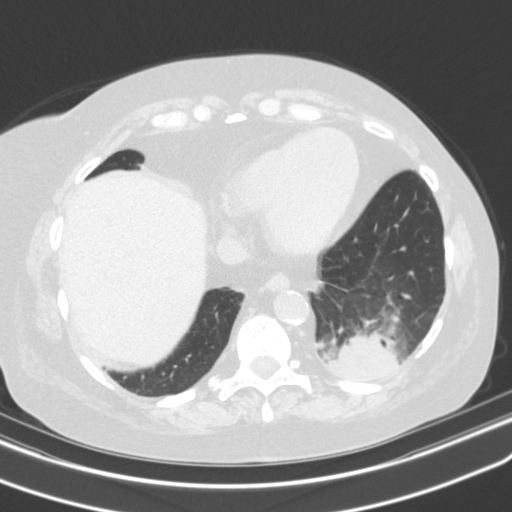
[im 57/153  mediastinal]
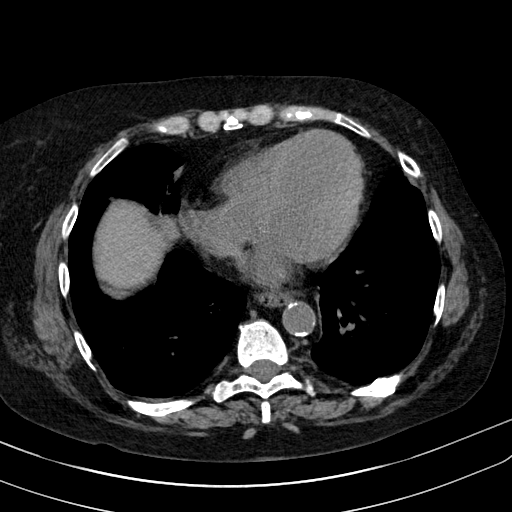
[im 57/153  lung]
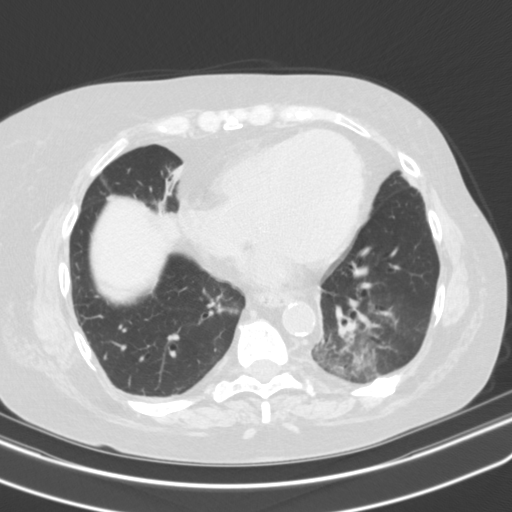
[im 62/153  lung]
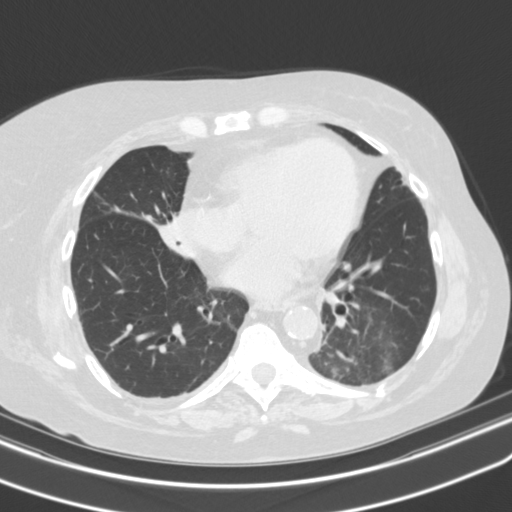
[im 72/153  lung]
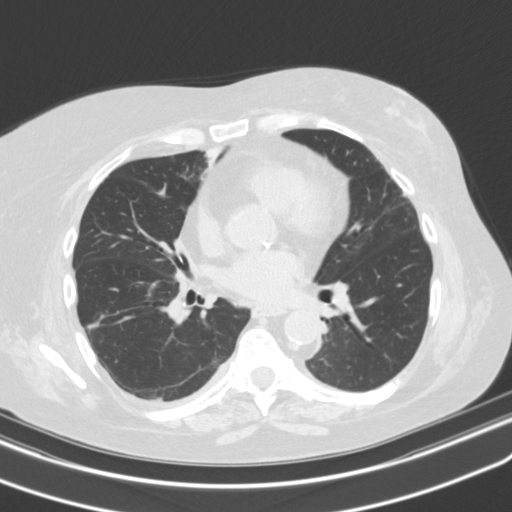
[im 82/153  lung]
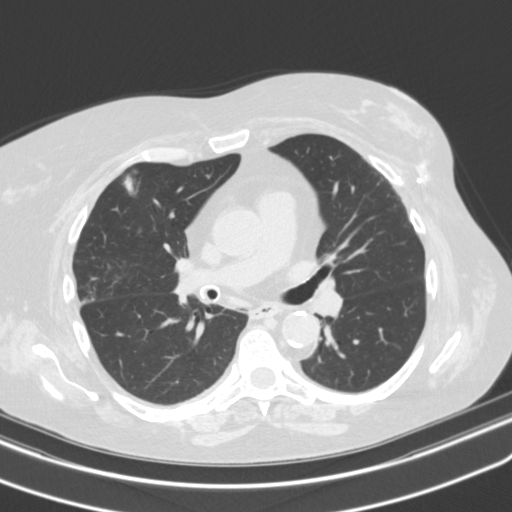
[im 91/153  mediastinal]
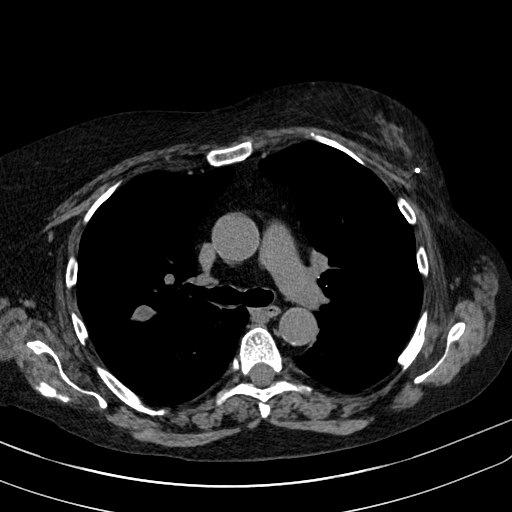
[im 91/153  lung]
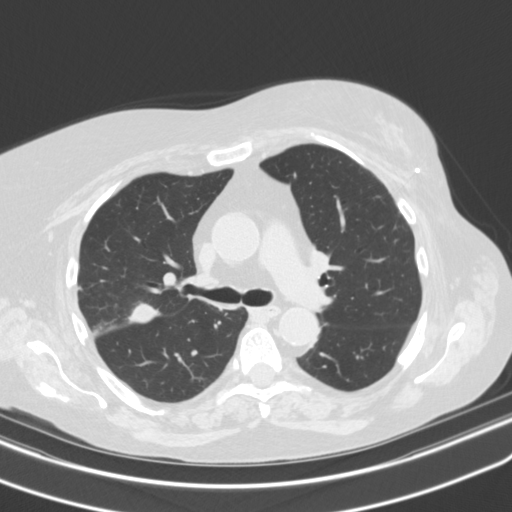
[im 96/153  lung]
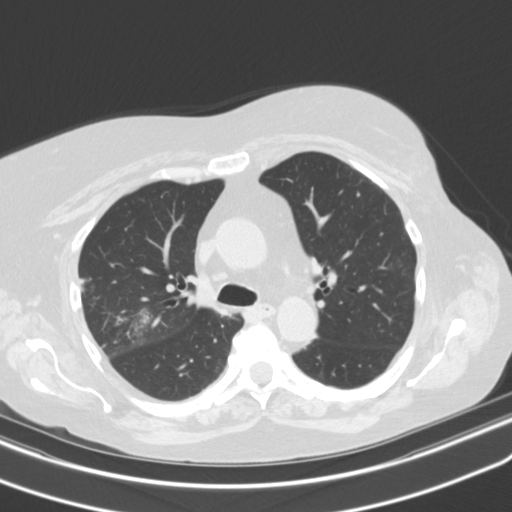
[im 113/153  lung]
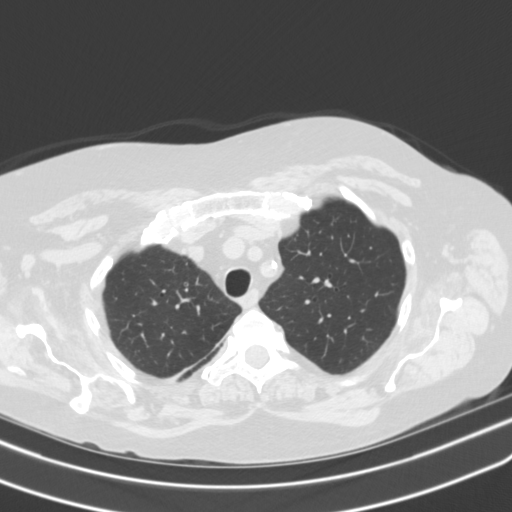
[im 122/153  lung]
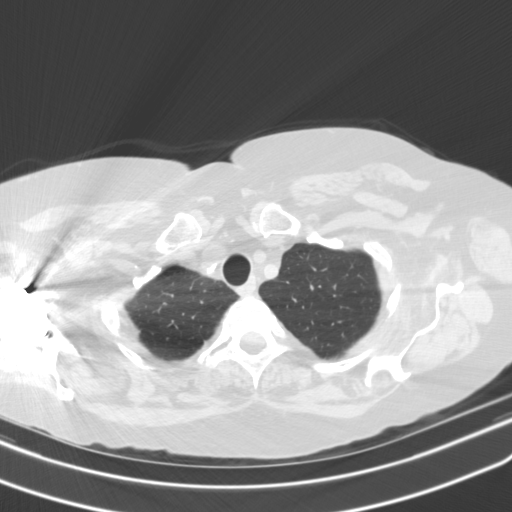
[im 130/153  mediastinal]
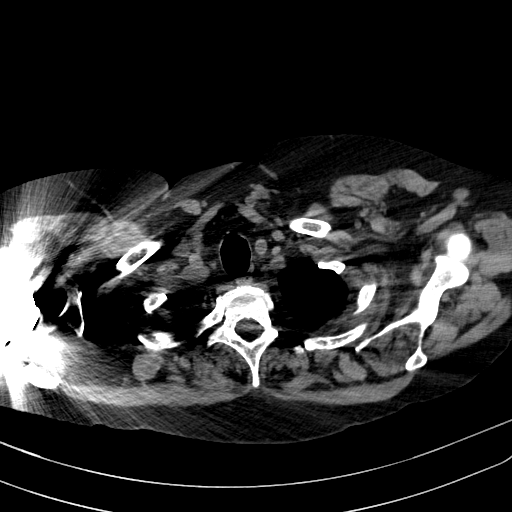
[im 130/153  lung]
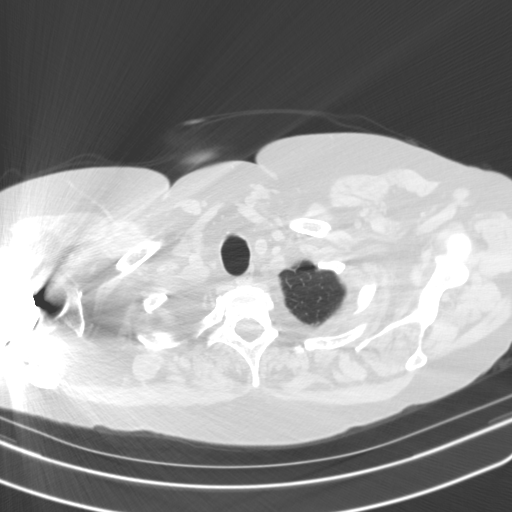
[im 141/153  lung]
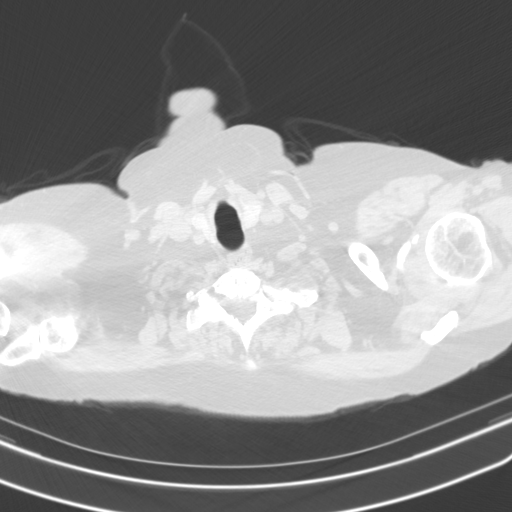

[14 of 34 positions shown; findings below may reference images not displayed]

FINDINGS: Cardiovascular: Calcified atheromatous plaque in the thoracic aorta.
Normal heart size with out pericardial effusion. Central pulmonary
vasculature is normal caliber. Three-vessel coronary artery disease.
Greatest in LEFT coronary circulation.

Mediastinum/Nodes: No adenopathy in the chest. Esophagus grossly
normal. No hilar adenopathy.

Lungs/Pleura: Masslike area with cavitation in the LEFT lung base
(image 103, series [DATE] x 3.6 cm associated with ground-glass and
thick-walled appearance. Small amount of central cavitation.

No sign of pleural effusion

Nodule along the fissure in the RIGHT upper lobe (image 61, series
[DATE] x 13 mm.

Ill-defined nodule in the RIGHT upper lobe on image 73 of series 5
measuring 7 mm.

Opacity in the anterior RIGHT chest with ground-glass and bandlike
appearance extending into the anterior RIGHT upper lobe measuring 13
mm greatest axial dimension.

Consolidative changes about bronchial structures in the RIGHT middle
lobe along the major fissure with nodular appearance on axial view
approximately 1.9 x 1.1 cm on image 96 of series 5 no pleural
effusion. Airways are patent. Small nodule also seen at the LEFT
lung base on image 114 of series 5 measuring 4 mm.

Upper Abdomen: Lobular hepatic contours. Liver incompletely imaged.
Imaged portions of liver, spleen, pancreas, adrenal glands and
kidneys are otherwise unremarkable. Post cholecystectomy. No acute
gastrointestinal process on limited assessment.

Musculoskeletal: Calcifications and distortion in the LEFT breast
may reflect prior lumpectomy no chest wall mass. Streak artifact
from RIGHT shoulder arthroplasty. Segmental sclerosis of bilateral
ribs at multiple levels, some areas associated with subacute and
chronic fractures suspicious for metastatic disease to the ribs.
Posterior sixth and seventh ribs on the RIGHT with sclerosis of
ninth and tenth ribs less pronounced. Also segmental sclerosis of
the RIGHT eleventh rib. Most of these areas are posterior. Sternum
is intact. Signs of spinal fusion at the lower portion of the
cervical spine.

Multifocal rib involvement suspected on the LEFT as well. Again the
degree of sclerosis is more pronounced than would be expected for
simple rib fractures. On the LEFT multiple ribs, 5 through 9 with
sclerosis with subacute fractures of ribs 7, 8 and 9.
IMPRESSION: 1. Multifocal areas of nodularity and or mass with other areas of
consolidative change. Findings are suspicious for metastatic disease
with infection as a differential consideration and should be
excluded given patient history of anti TNF medication and
immunosuppression. Pulmonary consultation is suggested.
2. Multifocal areas of sclerosis involving the ribs bilaterally,
some areas associated with subacute rib fracture, others without
signs of rib fracture. Segmental appearance is suspicious for
metastatic disease in this patient with prior lumpectomy. For above
findings in both the ribs and lung parenchyma, PET scan may be
helpful.
3. Three-vessel coronary artery disease.
4. Atherosclerosis.

Aortic Atherosclerosis ([id number]).

## 2022-10-15 ENCOUNTER — Telehealth: Payer: Self-pay | Admitting: Family Medicine

## 2022-10-15 DIAGNOSIS — G4733 Obstructive sleep apnea (adult) (pediatric): Secondary | ICD-10-CM | POA: Diagnosis not present

## 2022-10-15 DIAGNOSIS — G894 Chronic pain syndrome: Secondary | ICD-10-CM | POA: Diagnosis not present

## 2022-10-15 DIAGNOSIS — G4736 Sleep related hypoventilation in conditions classified elsewhere: Secondary | ICD-10-CM | POA: Diagnosis not present

## 2022-10-15 DIAGNOSIS — J45909 Unspecified asthma, uncomplicated: Secondary | ICD-10-CM | POA: Diagnosis not present

## 2022-10-15 DIAGNOSIS — I5032 Chronic diastolic (congestive) heart failure: Secondary | ICD-10-CM | POA: Diagnosis not present

## 2022-10-15 DIAGNOSIS — R0602 Shortness of breath: Secondary | ICD-10-CM | POA: Diagnosis not present

## 2022-10-15 DIAGNOSIS — M0600 Rheumatoid arthritis without rheumatoid factor, unspecified site: Secondary | ICD-10-CM | POA: Diagnosis not present

## 2022-10-15 DIAGNOSIS — J471 Bronchiectasis with (acute) exacerbation: Secondary | ICD-10-CM | POA: Diagnosis not present

## 2022-10-15 DIAGNOSIS — J4540 Moderate persistent asthma, uncomplicated: Secondary | ICD-10-CM | POA: Diagnosis not present

## 2022-10-15 NOTE — Telephone Encounter (Signed)
Patient called to report she received her FLU shot from [facility] which they uploaded on the [facility]. Nothing further needed at this time.

## 2022-10-17 DIAGNOSIS — E1122 Type 2 diabetes mellitus with diabetic chronic kidney disease: Secondary | ICD-10-CM | POA: Diagnosis not present

## 2022-10-17 DIAGNOSIS — I959 Hypotension, unspecified: Secondary | ICD-10-CM | POA: Diagnosis not present

## 2022-10-17 DIAGNOSIS — I5022 Chronic systolic (congestive) heart failure: Secondary | ICD-10-CM | POA: Diagnosis not present

## 2022-10-17 DIAGNOSIS — I2510 Atherosclerotic heart disease of native coronary artery without angina pectoris: Secondary | ICD-10-CM | POA: Diagnosis not present

## 2022-10-17 DIAGNOSIS — N1832 Chronic kidney disease, stage 3b: Secondary | ICD-10-CM | POA: Diagnosis not present

## 2022-10-17 DIAGNOSIS — J45909 Unspecified asthma, uncomplicated: Secondary | ICD-10-CM | POA: Diagnosis not present

## 2022-10-17 DIAGNOSIS — Z7984 Long term (current) use of oral hypoglycemic drugs: Secondary | ICD-10-CM | POA: Diagnosis not present

## 2022-10-17 DIAGNOSIS — Z86718 Personal history of other venous thrombosis and embolism: Secondary | ICD-10-CM | POA: Diagnosis not present

## 2022-10-17 DIAGNOSIS — M797 Fibromyalgia: Secondary | ICD-10-CM | POA: Diagnosis not present

## 2022-10-19 NOTE — Telephone Encounter (Signed)
Patient paid $29 form completion fee via telephone on 09/28/22 at 2:39:06pm. Patient called to address bill she received for $29; copies just sent via email on file to patient for her to submit with the bill as proof of payment.

## 2022-10-21 IMAGING — DX DG CHEST 1V PORT
1 series · 1 of 1 positions shown · non-contrast
Comparison: March 21, 2020.

CLINICAL DATA: Status post bronchoscopy.

EXAM:
PORTABLE CHEST 1 VIEW

[chest ap]
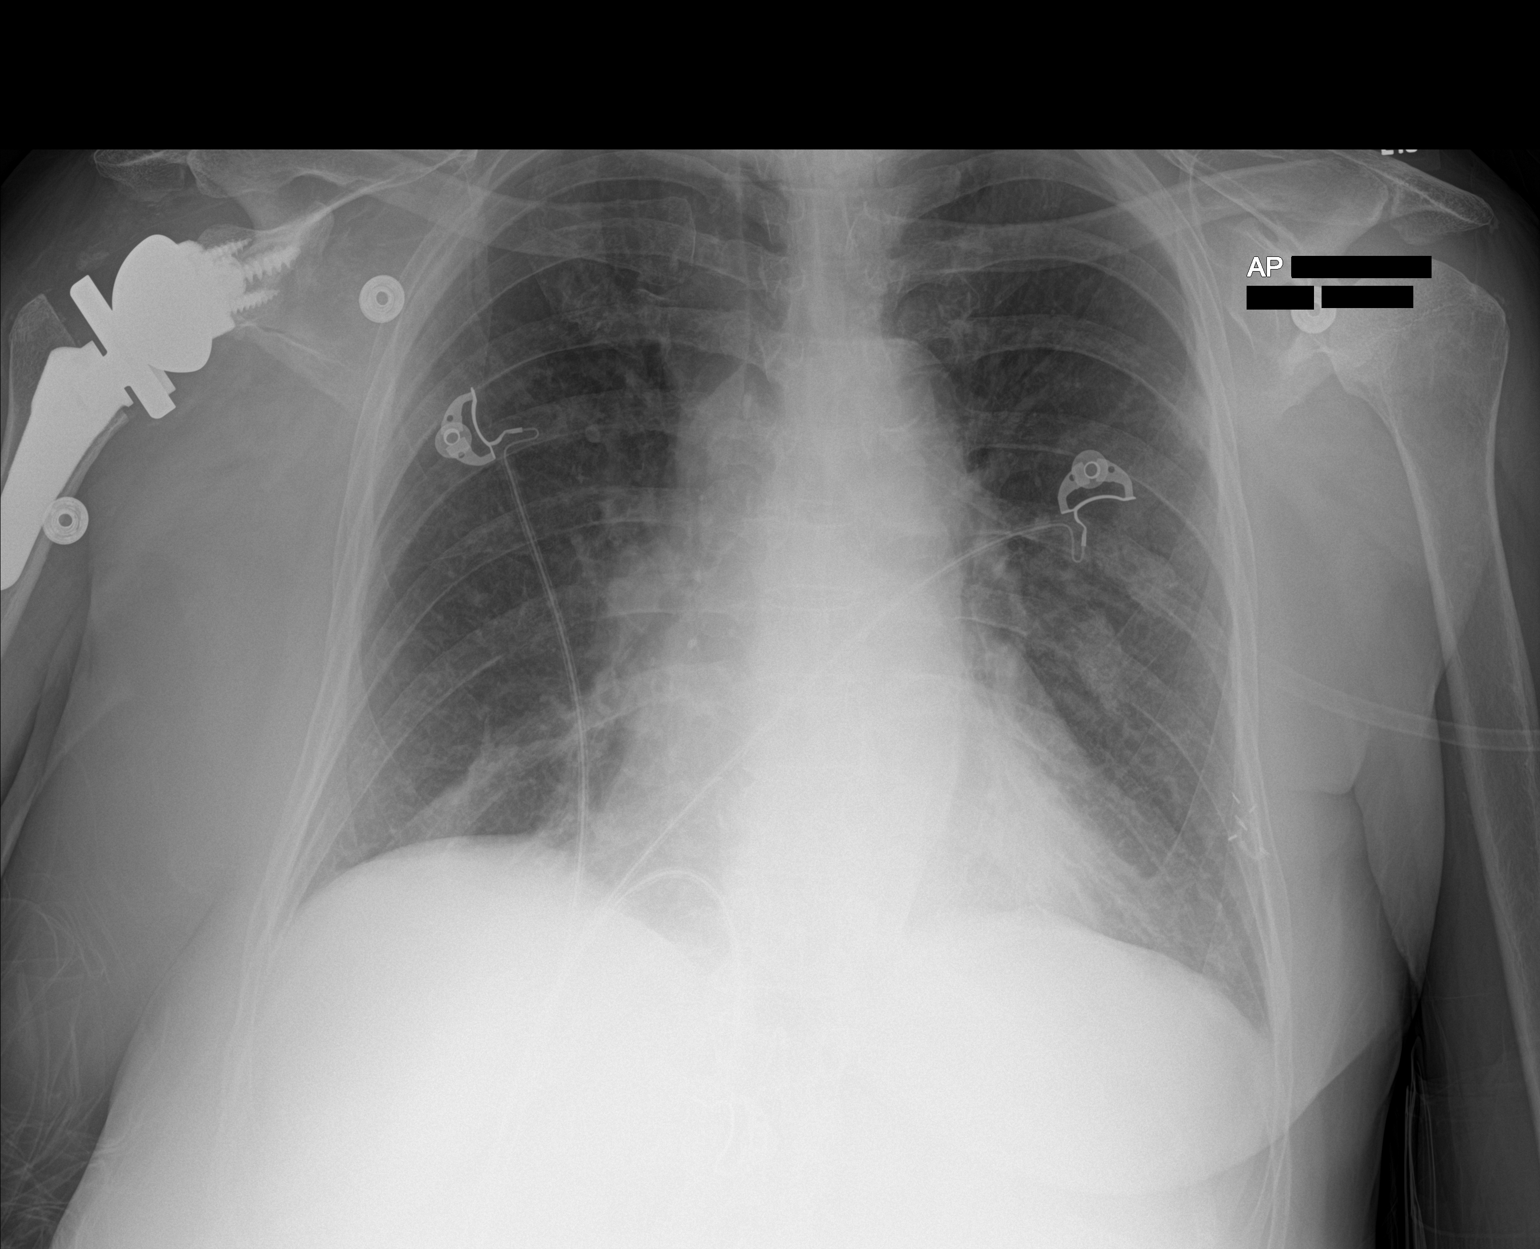

[1 of 1 positions shown; findings below may reference images not displayed]

FINDINGS: Stable cardiomediastinal silhouette. No pneumothorax or pleural
effusion is noted. Left lung is clear. Mild right basilar
subsegmental atelectasis is noted. Status post right shoulder
arthroplasty.
IMPRESSION: Mild right basilar subsegmental atelectasis. No pneumothorax is
noted.

## 2022-10-23 ENCOUNTER — Ambulatory Visit: Payer: Medicare PPO | Admitting: Allergy and Immunology

## 2022-10-24 ENCOUNTER — Telehealth: Payer: Self-pay | Admitting: Family Medicine

## 2022-10-24 ENCOUNTER — Telehealth: Payer: Self-pay | Admitting: Allergy and Immunology

## 2022-10-24 DIAGNOSIS — M797 Fibromyalgia: Secondary | ICD-10-CM | POA: Diagnosis not present

## 2022-10-24 DIAGNOSIS — N1831 Chronic kidney disease, stage 3a: Secondary | ICD-10-CM | POA: Diagnosis not present

## 2022-10-24 DIAGNOSIS — I5022 Chronic systolic (congestive) heart failure: Secondary | ICD-10-CM | POA: Diagnosis not present

## 2022-10-24 DIAGNOSIS — Z86718 Personal history of other venous thrombosis and embolism: Secondary | ICD-10-CM | POA: Diagnosis not present

## 2022-10-24 DIAGNOSIS — E1122 Type 2 diabetes mellitus with diabetic chronic kidney disease: Secondary | ICD-10-CM | POA: Diagnosis not present

## 2022-10-24 DIAGNOSIS — D631 Anemia in chronic kidney disease: Secondary | ICD-10-CM | POA: Diagnosis not present

## 2022-10-24 DIAGNOSIS — J45909 Unspecified asthma, uncomplicated: Secondary | ICD-10-CM | POA: Diagnosis not present

## 2022-10-24 DIAGNOSIS — I509 Heart failure, unspecified: Secondary | ICD-10-CM | POA: Diagnosis not present

## 2022-10-24 DIAGNOSIS — Z7984 Long term (current) use of oral hypoglycemic drugs: Secondary | ICD-10-CM | POA: Diagnosis not present

## 2022-10-24 DIAGNOSIS — I959 Hypotension, unspecified: Secondary | ICD-10-CM | POA: Diagnosis not present

## 2022-10-24 DIAGNOSIS — M109 Gout, unspecified: Secondary | ICD-10-CM | POA: Diagnosis not present

## 2022-10-24 DIAGNOSIS — N1832 Chronic kidney disease, stage 3b: Secondary | ICD-10-CM | POA: Diagnosis not present

## 2022-10-24 DIAGNOSIS — M0600 Rheumatoid arthritis without rheumatoid factor, unspecified site: Secondary | ICD-10-CM | POA: Diagnosis not present

## 2022-10-24 DIAGNOSIS — I2510 Atherosclerotic heart disease of native coronary artery without angina pectoris: Secondary | ICD-10-CM | POA: Diagnosis not present

## 2022-10-24 NOTE — Telephone Encounter (Signed)
Patient called stating she is taking medication cyproheptadine (PERIACTIN) 4 MG tablet [[id number]]  which Dr. Lucie Leather prescribed patient states she is still not sleeping even after taking medication as directed asking for a nurse to call in reference to this issue

## 2022-10-24 NOTE — Telephone Encounter (Signed)
Patient called to follow up on cyproheptadine (PERIACTIN) 4 MG tablet  script received from Dr. Lucie Leather. Patient stated the medication doesn't work well enough; she doesn't usually go to sleep until about 4am. Patient only had 1-2 hours of sleep for the past 3 nights.  Dr. Kathyrn Lass office recommended for the patient to call this office to follow up.  Requesting call back with best advise/medication management.  Please advise at [phone].

## 2022-10-24 NOTE — Telephone Encounter (Signed)
Patient called and stated that she is still suffering from lack of sleep. She stated that she was going to reach out to her PCP to see how they can assist her and possibly a referral out somewhere to get better assistance. Patient just wanted to let Dr. Lucie Leather know and see if he had any other suggestions.

## 2022-10-25 ENCOUNTER — Other Ambulatory Visit: Payer: Self-pay | Admitting: Family Medicine

## 2022-10-25 MED ORDER — TRAZODONE HCL 50 MG PO TABS: 50.0000 mg | ORAL_TABLET | Freq: Every day | ORAL | 0 refills | Status: DC

## 2022-10-26 DIAGNOSIS — J4551 Severe persistent asthma with (acute) exacerbation: Secondary | ICD-10-CM | POA: Diagnosis not present

## 2022-10-31 ENCOUNTER — Other Ambulatory Visit: Payer: Self-pay | Admitting: Family Medicine

## 2022-10-31 ENCOUNTER — Other Ambulatory Visit: Payer: Self-pay | Admitting: Internal Medicine

## 2022-10-31 NOTE — Telephone Encounter (Signed)
Last Fill: 07/30/2022  Next Visit: 12/11/2022  Last Visit: 09/10/2022  Dx:  Rheumatoid arthritis with negative rheumatoid factor, involving unspecified site   Current Dose per office note on 09/10/2022: prednisone 7 mg daily   Okay to refill Prednisone?

## 2022-11-01 NOTE — Telephone Encounter (Signed)
Requested Prescriptions  Pending Prescriptions Disp Refills   allopurinol (ZYLOPRIM) 100 MG tablet [Pharmacy Med Name: allopurinol 100 mg tablet] 90 tablet 0    Sig: TAKE 1 TABLET BY MOUTH DAILY     Endocrinology:  Gout Agents - allopurinol Failed - 10/31/2022 11:45 AM      Failed - Uric Acid in normal range and within 360 days    Uric Acid, Serum  Date Value Ref Range Status  03/08/2018 4.2 2.5 - 7.1 mg/dL Final    Comment:    Performed at [facility], [address] [place], [place] [address]         Failed - Cr in normal range and within 360 days    Creat  Date Value Ref Range Status  09/11/2022 1.51 (H) 0.60 - 1.00 mg/dL Final   Creatinine, Urine  Date Value Ref Range Status  05/31/2014 17.81 (L) >20.0 mg/dL Final         Failed - Valid encounter within last 12 months    Recent Outpatient Visits           1 year ago Chronic fatigue   [facility] Tanya Nones, [name], MD   1 year ago Anemia, unspecified type   [facility] Medicine [name], MD   1 year ago Anemia, unspecified type   [facility] Medicine [name], MD   1 year ago Type 2 diabetes mellitus with other specified complication, unspecified whether long term insulin use (HCC)   [facility] [name], [name], MD   1 year ago Chronic combined systolic and diastolic CHF (congestive heart failure) (HCC)   [name] [name], [name], MD       Future Appointments             In 1 month Jodelle Red, MD [facility] at [facility], [place]   In 1 month Rice, [name], MD [facility] Rheumatology   In 2 months Kozlow, [name], MD [facility] [place] at [facility] - CBC within normal limits and completed in the last 12 months    WBC  Date Value Ref Range Status  09/11/2022 9.6 3.8 - 10.8 Thousand/uL Final   RBC  Date Value Ref  Range Status  09/11/2022 3.04 (L) 3.80 - 5.10 Million/uL Final   Hemoglobin  Date Value Ref Range Status  09/11/2022 10.2 (L) 11.7 - 15.5 g/dL Final  60/45/4098 9.9 (L) 11.1 - 15.9 g/dL Final   HCT  Date Value Ref Range Status  09/11/2022 31.2 (L) 35.0 - 45.0 % Final   Hematocrit  Date Value Ref Range Status  08/20/2022 30.2 (L) 34.0 - 46.6 % Final   MCHC  Date Value Ref Range Status  09/11/2022 32.7 32.0 - 36.0 g/dL Final   [facility]  Date Value Ref Range Status  09/11/2022 33.6 (H) 27.0 - 33.0 pg Final   MCV  Date Value Ref Range Status  09/11/2022 102.6 (H) 80.0 - 100.0 fL Final  08/20/2022 105 (H) 79 - 97 fL Final   No results found for: "PLTCOUNTKUC", "LABPLAT", "POCPLA" RDW  Date Value Ref Range Status  09/11/2022 13.0 11.0 - 15.0 % Final  08/20/2022 12.9 11.7 - 15.4 % Final          mirtazapine (REMERON SOL-TAB) 30 MG disintegrating tablet [Pharmacy Med Name:  mirtazapine 30 mg disintegrating tablet] 90 tablet 0    Sig: Take 1 tablet by mouth at bedtime as needed for sleep.     Psychiatry: Antidepressants - mirtazapine Failed - 10/31/2022 11:45 AM      Failed - Completed PHQ-2 or PHQ-9 in the last 360 days      Failed - Valid encounter within last 6 months    Recent Outpatient Visits           1 year ago Chronic fatigue   [facility] [name], [name], MD   1 year ago Anemia, unspecified type   [facility] [name], [name], MD   1 year ago Anemia, unspecified type   [facility] [name], MD   1 year ago Type 2 diabetes mellitus with other specified complication, unspecified whether long term insulin use (HCC)   [facility] Medicine [name], MD   1 year ago Chronic combined systolic and diastolic CHF (congestive heart failure) (HCC)   [name] [name], [name], MD       Future Appointments             In 1 month Jodelle Red, MD [facility] Heart  & Vascular at [facility], [place]   In 1 month Rice, [name], MD [facility] Rheumatology   In 2 months Kozlow, [name], MD [facility] [place] at [facility]             atorvastatin (LIPITOR) 80 MG tablet [Pharmacy Med Name: atorvastatin 80 mg tablet] 90 tablet 0    Sig: TAKE 1 TABLET BY MOUTH DAILY     Cardiovascular:  Antilipid - Statins Failed - 10/31/2022 11:45 AM      Failed - Valid encounter within last 12 months    Recent Outpatient Visits           1 year ago Chronic fatigue   [facility] [name], [name], MD   1 year ago Anemia, unspecified type   [facility] Medicine [name], [name], MD   1 year ago Anemia, unspecified type   [facility] [name], MD   1 year ago Type 2 diabetes mellitus with other specified complication, unspecified whether long term insulin use (HCC)   [facility] Medicine [name], MD   1 year ago Chronic combined systolic and diastolic CHF (congestive heart failure) (HCC)   [name] [name], [name], MD       Future Appointments             In 1 month Jodelle Red, MD [facility] at [facility], [place]   In 1 month Rice, [name], MD [facility] Rheumatology   In 2 months Kozlow, [name], MD [facility]  at [facility]            Failed - Lipid Panel in normal range within the last 12 months    Cholesterol, Total  Date Value Ref Range Status  12/21/2020 162 100 - 199 mg/dL Final   Cholesterol  Date Value Ref Range Status  09/11/2022 123 <200 mg/dL Final   LDL Cholesterol (Calc)  Date Value Ref Range Status  09/11/2022 39 mg/dL (calc) Final    Comment:    Reference range: <100 . Desirable range <100 mg/dL for primary prevention;   <  70 mg/dL for patients with CHD or diabetic patients  with > or = 2 CHD risk factors. Marland Kitchen LDL-C is now calculated  using the Martin-Hopkins  calculation, which is a validated novel method providing  better accuracy than the Friedewald equation in the  estimation of LDL-C.  Horald Pollen et al. Lenox Ahr. 6578;469(62): 2061-2068  (http://education.Quest[employer].com/faq/FAQ164)    Direct LDL  Date Value Ref Range Status  09/23/2020 141.2 (H) 0 - 99 mg/dL Final    Comment:    Performed at [facility], [address], [place], [place] [address]   LDL Direct  Date Value Ref Range Status  08/07/2022 48 0 - 99 mg/dL Final   HDL  Date Value Ref Range Status  09/11/2022 56 > OR = 50 mg/dL Final  13/24/4010 64 >27 mg/dL Final   Triglycerides  Date Value Ref Range Status  09/11/2022 223 (H) <150 mg/dL Final    Comment:    . If a non-fasting specimen was collected, consider repeat triglyceride testing on a fasting specimen if clinically indicated.  Perry Mount et al. J. of Clin. Lipidol. 2015;9:129-169. Marland Kitchen          Passed - Patient is not pregnant

## 2022-11-02 DIAGNOSIS — J45909 Unspecified asthma, uncomplicated: Secondary | ICD-10-CM | POA: Diagnosis not present

## 2022-11-02 DIAGNOSIS — Z86718 Personal history of other venous thrombosis and embolism: Secondary | ICD-10-CM | POA: Diagnosis not present

## 2022-11-02 DIAGNOSIS — N1832 Chronic kidney disease, stage 3b: Secondary | ICD-10-CM | POA: Diagnosis not present

## 2022-11-02 DIAGNOSIS — I959 Hypotension, unspecified: Secondary | ICD-10-CM | POA: Diagnosis not present

## 2022-11-02 DIAGNOSIS — Z7984 Long term (current) use of oral hypoglycemic drugs: Secondary | ICD-10-CM | POA: Diagnosis not present

## 2022-11-02 DIAGNOSIS — I2510 Atherosclerotic heart disease of native coronary artery without angina pectoris: Secondary | ICD-10-CM | POA: Diagnosis not present

## 2022-11-02 DIAGNOSIS — E1122 Type 2 diabetes mellitus with diabetic chronic kidney disease: Secondary | ICD-10-CM | POA: Diagnosis not present

## 2022-11-02 DIAGNOSIS — M797 Fibromyalgia: Secondary | ICD-10-CM | POA: Diagnosis not present

## 2022-11-02 DIAGNOSIS — I5022 Chronic systolic (congestive) heart failure: Secondary | ICD-10-CM | POA: Diagnosis not present

## 2022-11-15 DIAGNOSIS — M0600 Rheumatoid arthritis without rheumatoid factor, unspecified site: Secondary | ICD-10-CM | POA: Diagnosis not present

## 2022-11-15 DIAGNOSIS — J45909 Unspecified asthma, uncomplicated: Secondary | ICD-10-CM | POA: Diagnosis not present

## 2022-11-15 DIAGNOSIS — I5032 Chronic diastolic (congestive) heart failure: Secondary | ICD-10-CM | POA: Diagnosis not present

## 2022-11-15 DIAGNOSIS — J471 Bronchiectasis with (acute) exacerbation: Secondary | ICD-10-CM | POA: Diagnosis not present

## 2022-11-15 DIAGNOSIS — R0602 Shortness of breath: Secondary | ICD-10-CM | POA: Diagnosis not present

## 2022-11-15 DIAGNOSIS — J4540 Moderate persistent asthma, uncomplicated: Secondary | ICD-10-CM | POA: Diagnosis not present

## 2022-11-15 DIAGNOSIS — G4733 Obstructive sleep apnea (adult) (pediatric): Secondary | ICD-10-CM | POA: Diagnosis not present

## 2022-11-15 DIAGNOSIS — G894 Chronic pain syndrome: Secondary | ICD-10-CM | POA: Diagnosis not present

## 2022-11-15 DIAGNOSIS — G4736 Sleep related hypoventilation in conditions classified elsewhere: Secondary | ICD-10-CM | POA: Diagnosis not present

## 2022-11-20 ENCOUNTER — Other Ambulatory Visit: Payer: Self-pay | Admitting: Family Medicine

## 2022-11-20 DIAGNOSIS — M19011 Primary osteoarthritis, right shoulder: Secondary | ICD-10-CM

## 2022-11-20 DIAGNOSIS — M1120 Other chondrocalcinosis, unspecified site: Secondary | ICD-10-CM

## 2022-11-20 NOTE — Telephone Encounter (Signed)
Patient called to follow up on refill requested for traMADol (ULTRAM) 50 MG tablet   Patient took last pill 2 days ago; requesting for refill to be sent today so her daughter can go pick it up for her this afternoon.   Pharmacy confirmed as:  [facility] - [place], [place] - [address] [place] [address] Phone: [phone]  Fax: [phone]   Please advise at [phone].

## 2022-11-21 NOTE — Telephone Encounter (Signed)
Requested medication (s) are due for refill today: yes  Requested medication (s) are on the active medication list: yes  Last refill:  08/09/22  Future visit scheduled: no  Notes to clinic:  Unable to refill per protocol, cannot delegate.      Requested Prescriptions  Pending Prescriptions Disp Refills   traMADol (ULTRAM) 50 MG tablet [Pharmacy Med Name: tramadol 50 mg tablet] 90 tablet 0    Sig: TAKE 1 TABLET BY MOUTH AT BEDTIME     Not Delegated - Analgesics:  Opioid Agonists Failed - 11/20/2022  3:13 PM      Failed - This refill cannot be delegated      Failed - Urine Drug Screen completed in last 360 days      Failed - Valid encounter within last 3 months    Recent Outpatient Visits           1 year ago Chronic fatigue   [facility] Tanya Nones, [name], MD   1 year ago Anemia, unspecified type   [facility] [name], [name], MD   1 year ago Anemia, unspecified type   [facility] [name], MD   1 year ago Type 2 diabetes mellitus with other specified complication, unspecified whether long term insulin use (HCC)   [facility] Medicine [name], MD   2 years ago Chronic combined systolic and diastolic CHF (congestive heart failure) (HCC)   [name] [name], [name], MD       Future Appointments             In 2 weeks Jodelle Red, MD [facility] Heart & Vascular at [facility], [place]   In 2 weeks Rice, [name], MD [facility] Rheumatology   In 1 month Kozlow, [name], MD [facility] [place] at [facility]

## 2022-11-22 ENCOUNTER — Other Ambulatory Visit: Payer: Self-pay

## 2022-11-22 ENCOUNTER — Ambulatory Visit (INDEPENDENT_AMBULATORY_CARE_PROVIDER_SITE_OTHER): Payer: Medicare PPO

## 2022-11-22 VITALS — Ht 60.0 in | Wt 127.0 lb

## 2022-11-22 DIAGNOSIS — Z0000 Encounter for general adult medical examination without abnormal findings: Secondary | ICD-10-CM | POA: Diagnosis not present

## 2022-11-22 DIAGNOSIS — M19011 Primary osteoarthritis, right shoulder: Secondary | ICD-10-CM

## 2022-11-22 DIAGNOSIS — M1120 Other chondrocalcinosis, unspecified site: Secondary | ICD-10-CM

## 2022-11-22 MED ORDER — TRAMADOL HCL 50 MG PO TABS
50.0000 mg | ORAL_TABLET | Freq: Every day | ORAL | 0 refills | Status: DC
Start: 2022-11-22 — End: 2023-01-24

## 2022-11-22 MED ORDER — TRAZODONE HCL 50 MG PO TABS: 50.0000 mg | ORAL_TABLET | Freq: Every day | ORAL | 0 refills | Status: DC

## 2022-11-22 NOTE — Patient Instructions (Signed)
[name] , Thank you for taking time to come for your Medicare Wellness Visit. I appreciate your ongoing commitment to your health goals. Please review the following plan we discussed and let me know if I can assist you in the future.   Referrals/Orders/Follow-Ups/Clinician Recommendations: Aim for 30 minutes of exercise or brisk walking, 6-8 glasses of water, and 5 servings of fruits and vegetables each day.  This is a list of the screening recommended for you and due dates:  Health Maintenance  Topic Date Due   Complete foot exam   Never done   Yearly kidney health urinalysis for diabetes  Never done   Zoster (Shingles) Vaccine (2 of 2) 11/13/2017   COVID-19 Vaccine (7 - 2023-24 season) 10/21/2022   Eye exam for diabetics  03/08/2023   Hemoglobin A1C  03/14/2023   Mammogram  04/17/2023   DTaP/Tdap/Td vaccine (5 - Td or Tdap) 08/19/2023   Yearly kidney function blood test for diabetes  09/11/2023   Medicare Annual Wellness Visit  11/22/2023   Pneumonia Vaccine  Completed   Flu Shot  Completed   DEXA scan (bone density measurement)  Completed   Hepatitis C Screening  Completed   HPV Vaccine  Aged Out   Colon Cancer Screening  Discontinued    Advanced directives: (ACP Link)Information on Advanced Care Planning can be found at [facility] [place] Advance Health Care Directives Advance Health Care Directives ([email or web address]   Next Medicare Annual Wellness Visit scheduled for next year: Yes

## 2022-11-22 NOTE — Telephone Encounter (Signed)
Patient seen for AWV and is requesting refills on Tramadol and Trazodone.  States that she has been out of these medications and is having trouble sleeping.

## 2022-11-22 NOTE — Progress Notes (Signed)
Subjective:   Ebony Scott is a 72 y.o. female who presents for Medicare Annual (Subsequent) preventive examination.  Visit Complete: Virtual  I connected with  [name] on 11/22/22 by a audio enabled telemedicine application and verified that I am speaking with the correct person using two identifiers.  Patient Location: Home  Provider Location: Office/Clinic  I discussed the limitations of evaluation and management by telemedicine. The patient expressed understanding and agreed to proceed.  Because this visit was a virtual/telehealth visit, some criteria may be missing or patient reported. Any vitals not documented were not able to be obtained and vitals that have been documented are patient reported.   Cardiac Risk Factors include: advanced age (>72men, >22 women);diabetes mellitus;dyslipidemia;sedentary lifestyle     Objective:    Today's Vitals   11/22/22 1249  Weight: 127 lb (57.6 kg)  Height: 5' (1.524 m)   Body mass index is 24.8 kg/m.     11/22/2022    2:24 PM 09/03/2022    1:38 PM 09/03/2022    1:09 PM 08/27/2022    2:41 PM 04/25/2022    6:16 PM 10/19/2021    7:48 AM 08/28/2021    8:50 AM  Advanced Directives  Does Patient Have a Medical Advance Directive? No Yes Yes Yes Yes Yes Yes  Type of Science writer of [employer] Power of [place];Living will Living will;Healthcare Power of [employer] Power of [place];Living will   Does patient want to make changes to medical advance directive?  No - Patient declined No - Patient declined  No - Patient declined No - Patient declined No - Patient declined  Copy of Healthcare Power of Attorney in Chart?   No - copy requested      Would patient like information on creating a medical advance directive? Yes (MAU/Ambulatory/Procedural Areas - Information given)          Current Medications (verified) Outpatient Encounter Medications as of 11/22/2022  Medication  Sig   acetaminophen (TYLENOL) 500 MG tablet Take 500 mg by mouth as needed for moderate pain or fever.   acetic acid-hydrocortisone (VOSOL-HC) OTIC solution Place 4 drops into both ears See admin instructions. Chloramp/sulfu/ampho/ampho/B/H drop 100-100-5 mg Instill 4 drops into both ears the first 5 days of the month, then as directed/as needed for itching   albuterol (VENTOLIN HFA) 108 (90 Base) MCG/ACT inhaler Inhale 2 puffs into the lungs every 6 (six) hours as needed for wheezing or shortness of breath.   allopurinol (ZYLOPRIM) 100 MG tablet TAKE 1 TABLET BY MOUTH DAILY   Alpha-D-Galactosidase (BEANO PO) Take 2-3 tablets by mouth 3 (three) times daily as needed (Gas).   Alpha-Lipoic Acid 600 MG TABS Take 600 mg by mouth daily.   atorvastatin (LIPITOR) 80 MG tablet TAKE 1 TABLET BY MOUTH DAILY   azelastine (ASTELIN) 0.1 % nasal spray Place 2 sprays into both nostrils daily. Use in each nostril as directed (Patient taking differently: Place 2 sprays into both nostrils at bedtime as needed for allergies or rhinitis. Use in each nostril as directed)   benzonatate (TESSALON) 200 MG capsule TAKE 1 CAPSULE BY MOUTH THREE TIMES DAILY AS NEEDED FOR COUGH (Patient taking differently: Take 200 mg by mouth 2 (two) times daily as needed for cough.)   budesonide (PULMICORT) 0.5 MG/2ML nebulizer solution Take 2 mLs (0.5 mg total) by nebulization 2 (two) times daily.   Calcium Citrate (CITRACAL PO) Take 1 tablet by mouth daily. With  magnesium   cetirizine (ZYRTEC) 10 MG tablet Take 1 tablet (10 mg total) by mouth 2 (two) times daily as needed for allergies (Can take a n extra dose during flare ups.). (Patient taking differently: Take 10 mg by mouth at bedtime.)   Cholecalciferol (VITAMIN D3) 25 MCG (1000 UT) capsule Take 1,000 Units by mouth at bedtime.   clopidogrel (PLAVIX) 75 MG tablet Take 1 tablet (75 mg total) by mouth daily.   cyproheptadine (PERIACTIN) 4 MG tablet Take 1 tablet (4 mg total) by mouth 3  (three) times daily as needed for allergies. (Patient taking differently: Take 12 mg by mouth at bedtime.)   denosumab (PROLIA) 60 MG/ML SOSY injection Inject 60 mg into the skin every 6 (six) months.   dexlansoprazole (DEXILANT) 60 MG capsule Take 60 mg by mouth daily.   empagliflozin (JARDIANCE) 10 MG TABS tablet Take 1 tablet (10 mg total) by mouth daily before breakfast.   EPINEPHRINE 0.3 mg/0.3 mL IJ SOAJ injection USE AS DIRECTED BY YOUR PHYSICIAN INTRAMUSCULARLY AS NEEDED FOR ANAPHYLAXIS   famotidine (PEPCID) 20 MG tablet Take 1 tablet (20 mg total) by mouth daily. TAKE 1 TABLET BY MOUTH EACH NIGHT AT BEDTIME Strength: 20 mg   folic acid (FOLVITE) 1 MG tablet Take 1 tablet (1 mg total) by mouth daily.   furosemide (LASIX) 40 MG tablet DAILY AS NEEDED FOR WEIGHT GAIN OF 2 POUNDS OVERNIGHT OR 5 POUNDS IN ONE WEEK   gabapentin (NEURONTIN) 300 MG capsule Take 1 capsule (300 mg total) by mouth at bedtime. (Patient taking differently: Take 300 mg by mouth at bedtime. Can take up to three time a say as needed)   GOODSENSE ARTHRITIS PAIN 1 % GEL Apply 1 Application topically 4 times daily as needed for pain. (Patient taking differently: Diclofenac)   guaiFENesin (MUCINEX) 600 MG 12 hr tablet Take 600 mg by mouth 2 (two) times daily.   HIZENTRA 10 GM/50ML SOLN Inject 65 mLs into the skin once a week. 13 g Infusion   ipratropium-albuterol (DUONEB) 0.5-2.5 (3) MG/3ML SOLN Take 3 mLs by nebulization every 6 (six) hours as needed.   isosorbide-hydrALAZINE (BIDIL) 20-37.5 MG tablet Take 1 tablet by mouth 2 (two) times daily.   Lactase (LACTOSE FAST ACTING RELIEF PO) Take 3 tablets by mouth 3 (three) times daily before meals. For dairy food or drink   lidocaine-prilocaine (EMLA) cream Apply 1 application  topically as needed (pain).   magnesium oxide (MAG-OX) 400 (240 Mg) MG tablet Take 400 mg by mouth at bedtime.   mirtazapine (REMERON SOL-TAB) 30 MG disintegrating tablet Take 1 tablet by mouth at bedtime  as needed for sleep.   montelukast (SINGULAIR) 10 MG tablet Take 1 tablet (10 mg total) by mouth daily after supper.   mupirocin ointment (BACTROBAN) 2 % Apply 1 Application topically at bedtime.   Nystatin (GERHARDT'S BUTT CREAM) CREA Apply topically 3 times a day as needed for itching or irritation   nystatin (MYCOSTATIN) 100000 UNIT/ML suspension Use as directed 15 mLs in the mouth or throat at bedtime as needed (if has yeast in mouth).   nystatin-triamcinolone ointment (MYCOLOG) Apply 1 Application topically daily as needed (Vaginal irritation).   predniSONE (DELTASONE) 1 MG tablet TAKE 2 TABLETS (2MG ) BY MOUTH ONCE DAILY WITH BREAKFAST. TAKE ALONG WITH 5MG  DAILY   predniSONE (DELTASONE) 5 MG tablet tAKE 1 TABLET (5MG ) BY MOUTH ONCE DAILY WITH BREAKFAST   Prenatal Vit-Fe Fumarate-FA (PRENATAL VITAMINS PO) Take 1 tablet by mouth daily.   Probiotic  Product (ALIGN) 4 MG CAPS Take 4 mg by mouth daily.   Propylene Glycol (SYSTANE COMPLETE) 0.6 % SOLN Place 1 drop into both eyes 2 (two) times daily.   sacubitril-valsartan (ENTRESTO) 24-26 MG Take 1 tablet by mouth 2 (two) times daily.   Tiotropium Bromide-Olodaterol (STIOLTO RESPIMAT) 2.5-2.5 MCG/ACT AERS Inhale 2 puffs into the lungs daily. (Patient taking differently: Inhale 2 puffs into the lungs at bedtime.)   traMADol (ULTRAM) 50 MG tablet Take 1 tablet (50 mg total) by mouth at bedtime.   traZODone (DESYREL) 50 MG tablet Take 1 tablet (50 mg total) by mouth at bedtime.   Triamcinolone Acetonide (TRIAMCINOLONE 0.1 % CREAM : EUCERIN) CREA Apply 1 Application topically 2 (two) times daily. (Patient taking differently: Apply 1 Application topically daily as needed for rash or irritation.)   triamcinolone ointment (KENALOG) 0.5 % Apply 1 Application topically 2 (two) times daily.   XARELTO 20 MG TABS tablet TAKE 1 TABLET BY MOUTH DAILY WITH SUPPER (Patient taking differently: Take 20 mg by mouth daily with supper.)   Facility-Administered Encounter  Medications as of 11/22/2022  Medication   iohexol (OMNIPAQUE) 350 MG/ML injection    Allergies (verified) Baclofen, Dust mite extract, Molds & smuts, Morphine and codeine, Other, Penicillins, Rofecoxib, Shellfish allergy, Shrimp extract, Tetracycline hcl, Tetracycline hcl, Xolair [omalizumab], Zoledronic acid, Dilaudid [hydromorphone hcl], Hydrocodone-acetaminophen, Levofloxacin, Oxycodone hcl, Oxycodone hcl, Paroxetine, Celecoxib, Diltiazem hcl, Lactose, Lactose intolerance (gi), Oxycodone-acetaminophen, Rituximab, Tree extract, Gamunex [immune globulin], and Penicillin g procaine   History: Past Medical History:  Diagnosis Date   Anemia    Angio-edema    Breast cancer (HCC) 1998   Left breast, in remission   Bronchiectasis (HCC)    CHF (congestive heart failure) (HCC)    Clostridium difficile colitis 01/14/2018   Coronary artery disease    Depression    "some; don't take anything for it" (02/20/2012), pt denies dx as of 06/02/21   Diverticulosis    DVT (deep venous thrombosis) (HCC)    Fibromyalgia 11/2011   GAVE (gastric antral vascular ectasia)    GERD (gastroesophageal reflux disease)    Headache(784.0)    "related to allergies; more at different times during the year" (02/20/2012)   Hemorrhoids    Hiatal hernia    back and neck   History of blood transfusion 2023   1 unit, 5 units of Iron   Hx of adenomatous colonic polyps 04/12/2016   Hypercholesteremia    good cholesterol is high   Hypothyroidism    h/o Graves disease   IBS (irritable bowel syndrome)    Moderate persistent asthma    -FeV1 72% 2011, -IgE 102 2011, CT sinus Neg 2011   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 Casimer Lanius   Sleep apnea    uses cpap nightly   Tracheobronchomalacia    Past Surgical History:  Procedure Laterality Date    ABDOMINAL HYSTERECTOMY N/A    Phreesia 10/21/2019   ANTERIOR AND POSTERIOR REPAIR  1990's   APPENDECTOMY     ARGON LASER APPLICATION  02/05/2021   Procedure: ARGON LASER APPLICATION;  Surgeon: Benancio Deeds, MD;  Location: [facility];  Service: Gastroenterology;;   BREAST LUMPECTOMY  1998   left   BREAST SURGERY N/A    Phreesia 10/21/2019   BRONCHIAL WASHINGS  04/05/2020   Procedure:  BRONCHIAL WASHINGS;  Surgeon: Martina Sinner, MD;  Location: [facility];  Service: Pulmonary;;   CAPSULOTOMY Right 08/28/2021   Procedure: CAPSULOTOMY;  Surgeon: Candelaria Stagers, DPM;  Location: [facility];  Service: Podiatry;  Laterality: Right;   CARPOMETACARPEL (CMC) FUSION OF THUMB WITH AUTOGRAFT FROM RADIUS  ~ 2009   "both thumbs" (02/20/2012)   CATARACT EXTRACTION W/ INTRAOCULAR LENS  IMPLANT, BILATERAL  2012   CERVICAL DISCECTOMY  10/2001   C5-C6   CERVICAL FUSION  2003   C3-C4   CHOLECYSTECTOMY     COLONOSCOPY     CORONARY STENT INTERVENTION N/A 01/11/2021   Procedure: CORONARY STENT INTERVENTION;  Surgeon: Marykay Lex, MD;  [facility] INVAS[facility] CV LAB;  Service: Cardiovascular;  Laterality: N/A;   DEBRIDEMENT TENNIS ELBOW  ?1970's   right   ESOPHAGOGASTRODUODENOSCOPY     ESOPHAGOGASTRODUODENOSCOPY (EGD) WITH PROPOFOL N/A 02/05/2021   Procedure: ESOPHAGOGASTRODUODENOSCOPY (EGD) WITH PROPOFOL;  Surgeon: Benancio Deeds, MD;  Location: [facility];  Service: Gastroenterology;  Laterality: N/A;   EYE SURGERY N/A    Phreesia 10/21/2019   HAMMER TOE SURGERY Right 08/28/2021   Procedure: HAMMER TOE REPAIR 2ND TOE RIGHT FOOT;  Surgeon: Candelaria Stagers, DPM;  Location: [facility];  Service: Podiatry;  Laterality: Right;   HYSTERECTOMY     I & D EXTREMITY Right 09/22/2020   Procedure: IRRIGATION AND DEBRIDEMENT RIGHT HAND AND ELBOW;  Surgeon: Teryl Lucy, MD;  Location: [facility];  Service: Orthopedics;  Laterality: Right;   ICD IMPLANT N/A 08/27/2022   Procedure: ICD IMPLANT;  Surgeon: Maurice Small, MD;  Location: [facility];  Service: Cardiovascular;  Laterality: N/A;   KNEE ARTHROPLASTY  ?1990's   "?right; w/cartilage repair" (02/20/2012)   MASS EXCISION Left 08/28/2021   Procedure: EXCISION OF SOFT TISSUE  MASS LEFT FOOT;  Surgeon: Candelaria Stagers, DPM;  Location: [facility];  Service: Podiatry;  Laterality: Left;   NASAL SEPTUM SURGERY  1980's   POSTERIOR CERVICAL FUSION/FORAMINOTOMY  2004   "failed initial fusion; rewired  anterior neck" (02/20/2012)   REVERSE SHOULDER ARTHROPLASTY Right 02/16/2020   Procedure: REVERSE SHOULDER ARTHROPLASTY;  Surgeon: Teryl Lucy, MD;  Location: [facility];  Service: Orthopedics;  Laterality: Right;   RIGHT/LEFT HEART CATH AND CORONARY ANGIOGRAPHY N/A 01/11/2021   Procedure: RIGHT/LEFT HEART CATH AND CORONARY ANGIOGRAPHY;  Surgeon: Marykay Lex, MD;  Location: [facility] INVASIVE CV LAB;  Service: Cardiovascular;  Laterality: N/A;   SPINE SURGERY N/A    Phreesia 10/21/2019   TONSILLECTOMY  ~ 1953   VESICOVAGINAL FISTULA CLOSURE W/ TAH  1988   VIDEO BRONCHOSCOPY Bilateral 08/23/2016   Procedure: VIDEO BRONCHOSCOPY WITH FLUORO;  Surgeon: Roslynn Amble, MD;  Location: [facility];  Service: Cardiopulmonary;  Laterality: Bilateral;   VIDEO BRONCHOSCOPY N/A 04/05/2020   Procedure: VIDEO BRONCHOSCOPY WITHOUT FLUORO;  Surgeon: Martina Sinner, MD;  Location: [facility];  Service: Pulmonary;  Laterality: N/A;   Family History  Problem Relation Age of Onset   Allergies Mother    Heart disease Mother    Arthritis Mother    Lung cancer Mother        heavy smoker   Diabetes Mother    Allergies Father    Heart disease Father    Arthritis Father    Stroke Father    Heart disease Brother    Diabetes Maternal Grandfather    Colitis Daughter    Colon cancer Other        Maternal  half uncle   Social History   Socioeconomic History   Marital status: Divorced    Spouse name: Not on file   Number of children: 2   Years of education:  college   Highest education level: Not on file  Occupational History   Occupation: Disabled    Comment: Retired Dispensing optician: RETIRED  Tobacco Use   Smoking status: Never    Passive exposure: Past   Smokeless tobacco: Never   Tobacco comments:    Parents  Vaping Use   Vaping status: Never Used  Substance and Sexual Activity   Alcohol use: Not Currently    Alcohol/week: 0.0 standard drinks of alcohol   Drug use: No   Sexual activity: Not Currently    Birth control/protection: Surgical    Comment: Hysterectomy  Other Topics Concern   Not on file  Social History Narrative   Patient lives at home alone. Patient  divorced.    Patient has her BS degree.   Right handed.   Caffeine- sometimes coffee.      [facility]:   Born in [place], [place]. She worked as a Armed forces operational officer. She has no pets currently. She does have indoor plants. Previously had mold in her home that was remediated. Carpet was removed.          Social Determinants of Health   Financial Resource Strain: Low Risk  (11/22/2022)   Overall Financial Resource Strain (CARDIA)    Difficulty of Paying Living Expenses: Not hard at all  Food Insecurity: No Food Insecurity (11/22/2022)   Hunger Vital Sign    Worried About Running Out of Food in the Last Year: Never true    Ran Out of Food in the Last Year: Never true  Transportation Needs: No Transportation Needs (11/22/2022)   PRAPARE - Administrator, Civil Service (Medical): No    Lack of Transportation (Non-Medical): No  Physical Activity: Insufficiently Active (11/22/2022)   Exercise Vital Sign    Days of Exercise per Week: 3 days    Minutes of Exercise per Session: 30 min  Stress: No Stress Concern Present (11/22/2022)   [employer] of Occupational Health - Occupational Stress Questionnaire    Feeling of Stress : Not at all  Social Connections: Moderately Isolated (11/22/2022)   Social Connection and Isolation  Panel [NHANES]    Frequency of Communication with Friends and Family: More than three times a week    Frequency of Social Gatherings with Friends and Family: Three times a week    Attends Religious Services: 1 to 4 times per year    Active Member of Clubs or Organizations: No    Attends Banker Meetings: Never    Marital Status: Divorced    Tobacco Counseling Counseling given: Not Answered Tobacco comments: Parents   Clinical Intake:  Pre-visit preparation completed: Yes  Pain : No/denies pain     Diabetes: Yes CBG done?: No Did pt. bring in CBG monitor from home?: No  How often do you need to have someone help you when you read instructions, pamphlets, or other written materials from your doctor or pharmacy?: 1 - Never  Interpreter Needed?: No  Information entered by :: Kandis Fantasia LPN   Activities of Daily Living    11/22/2022    2:23 PM 09/03/2022    1:09 PM  In your present state of health, do you have any difficulty performing the following activities:  Hearing? 0 0  Vision? 0 0  Difficulty concentrating or making decisions? 0 1  Walking or climbing stairs? 0 1  Dressing or bathing? 0 1  Doing errands, shopping? 0 1  Preparing Food and eating ? N   Using the Toilet? N   In the past six months, have you accidently leaked urine? N   Do you have problems with loss of bowel control? N   Managing your Medications? N   Managing your Finances? N   Housekeeping or managing your Housekeeping? N     Patient Care Team: [name], MD as PCP - General (Family Medicine) Jodelle Red, MD as PCP - Cardiology (Cardiology) Mealor, Roberts Gaudy, MD as PCP - Electrophysiology (Cardiology) Jodelle Red, MD as Consulting Physician (Cardiology)  Indicate any recent Medical Services you may have received from other than [facility] providers in the past year (date may be approximate).     Assessment:   This is a routine wellness  examination for [place].  Hearing/Vision screen Hearing Screening - Comments:: Denies hearing difficulties   Vision Screening - Comments::  up to date with routine eye exams with [facility]     Goals Addressed   None   Depression Screen    11/22/2022    2:22 PM 07/13/2021    3:22 PM 08/05/2020    3:09 PM 07/07/2020   10:55 AM 03/27/2018    9:17 AM 02/25/2017   10:37 AM 08/20/2016   11:19 AM  PHQ 2/9 Scores  PHQ - 2 Score 0 0 0 0 0 2 0  PHQ- 9 Score      9     Fall Risk    11/22/2022    2:23 PM 07/13/2021    3:27 PM 10/27/2020   10:40 AM 08/05/2020    3:09 PM 07/07/2020   10:53 AM  Fall Risk   Falls in the past year? 0 0 0 0 1  Number falls in past yr: 0 0 0 0 1  Injury with Fall? 0 0 0 0 0  Risk for fall due to : Impaired mobility Impaired balance/gait Impaired mobility  History of fall(s);Impaired balance/gait  Follow up Falls prevention discussed;Education provided;Falls evaluation completed Falls prevention discussed  Falls evaluation completed Falls evaluation completed;Falls prevention discussed    MEDICARE RISK AT HOME: Medicare Risk at Home Any stairs in or around the home?: No If so, are there any without handrails?: No Home free of loose throw rugs in walkways, pet beds, electrical cords, etc?: Yes Adequate lighting in your home to reduce risk of falls?: Yes Life alert?: No Use of a cane, walker or w/c?: No Grab bars in the bathroom?: Yes Shower chair or bench in shower?: No Elevated toilet seat or a handicapped toilet?: Yes  TIMED UP AND GO:  Was the test performed?  No    Cognitive Function:        11/22/2022    2:23 PM 07/13/2021    3:29 PM  6CIT Screen  What Year? 0 points 0 points  What month? 0 points 0 points  What time? 0 points 0 points  Count back from 20 0 points 0 points  Months in reverse 0 points 0 points  Repeat phrase 0 points 0 points  Total Score 0 points 0 points    Immunizations Immunization History  Administered Date(s)  Administered   DTaP 08/18/2013   Fluad Quad(high Dose 65+) 10/16/2018, 11/20/2019, 11/03/2020   Hepatitis A 09/04/2007, 03/02/2008   Hepatitis B 01/07/1985, 02/06/1985, 08/17/1985  Influenza Split 11/13/2010, 11/22/2011, 10/20/2012   Influenza Whole 11/14/2009, 11/21/2011   Influenza, High Dose Seasonal PF 11/09/2015, 11/27/2017   Influenza, Quadrivalent, Recombinant, Inj, Pf 11/20/2019   Influenza,inj,Quad PF,6+ Mos 11/06/2013, 11/09/2014, 11/06/2016   Influenza-Unspecified 11/06/2021, 10/12/2022   Meningococcal Conjugate 09/04/2007   Moderna Covid-19 Fall Seasonal Vaccine 15yrs & older 01/08/2022   PFIZER(Purple Top)SARS-COV-2 Vaccination 03/12/2019, 04/01/2019, 12/16/2019, 06/29/2020   Pfizer Covid-19 Vaccine Bivalent Booster 38yrs & up 03/02/2021   Pneumococcal Conjugate-13 05/18/2013   Pneumococcal Polysaccharide-23 01/05/1994, 11/28/2011, 05/27/2017   Respiratory Syncytial Virus Vaccine,Recomb Aduvanted(Arexvy) 11/06/2021   Td 07/24/1995, 03/16/2005   Tdap 08/18/2013   Zoster Recombinant(Shingrix) 09/18/2017   Zoster, Live 03/02/2008    TDAP status: Up to date  Flu Vaccine status: Up to date  Pneumococcal vaccine status: Up to date  Covid-19 vaccine status: Information provided on how to obtain vaccines.   Qualifies for Shingles Vaccine? Yes   Zostavax completed Yes   Shingrix Completed?: No.    Education has been provided regarding the importance of this vaccine. Patient has been advised to call insurance company to determine out of pocket expense if they have not yet received this vaccine. Advised may also receive vaccine at local pharmacy or Health Dept. Verbalized acceptance and understanding.  Screening Tests Health Maintenance  Topic Date Due   FOOT EXAM  Never done   Diabetic kidney evaluation - Urine ACR  Never done   Zoster Vaccines- Shingrix (2 of 2) 11/13/2017   COVID-19 Vaccine (7 - 2023-24 season) 10/21/2022   OPHTHALMOLOGY EXAM  03/08/2023   HEMOGLOBIN  A1C  03/14/2023   MAMMOGRAM  04/17/2023   DTaP/Tdap/Td (5 - Td or Tdap) 08/19/2023   Diabetic kidney evaluation - eGFR measurement  09/11/2023   Medicare Annual Wellness (AWV)  11/22/2023   Pneumonia Vaccine 56+ Years old  Completed   INFLUENZA VACCINE  Completed   DEXA SCAN  Completed   Hepatitis C Screening  Completed   HPV VACCINES  Aged Out   Colonoscopy  Discontinued    Health Maintenance  Health Maintenance Due  Topic Date Due   FOOT EXAM  Never done   Diabetic kidney evaluation - Urine ACR  Never done   Zoster Vaccines- Shingrix (2 of 2) 11/13/2017   COVID-19 Vaccine (7 - 2023-24 season) 10/21/2022    Colorectal cancer screening: Type of screening: Colonoscopy. Completed 04/04/16. Repeat every - years  Mammogram status: Completed 04/16/22. Repeat every year  Bone Density status: Completed 07/26/22. Results reflect: Bone density results: OSTEOPENIA. Repeat every 2 years.  Lung Cancer Screening: (Low Dose CT Chest recommended if Age 84-80 years, 20 pack-year currently smoking OR have quit w/in 15years.) does not qualify.   Lung Cancer Screening Referral: n/a  Additional Screening:  Hepatitis C Screening: does qualify; Completed 01/21/18  Vision Screening: Recommended annual ophthalmology exams for early detection of glaucoma and other disorders of the eye. Is the patient up to date with their annual eye exam?  Yes  Who is the provider or what is the name of the office in which the patient attends annual eye exams? [facility] If pt is not established with a provider, would they like to be referred to a provider to establish care? No .   Dental Screening: Recommended annual dental exams for proper oral hygiene  Diabetic Foot Exam: Diabetic Foot Exam: Overdue, Pt has been advised about the importance in completing this exam. Pt is scheduled for diabetic foot exam on at next office visit.  Community  Resource Referral / Chronic Care Management: CRR required this visit?   No   CCM required this visit?  No     Plan:     I have personally reviewed and noted the following in the patient's chart:   Medical and social history Use of alcohol, tobacco or illicit drugs  Current medications and supplements including opioid prescriptions. Patient is currently taking opioid prescriptions. Information provided to patient regarding non-opioid alternatives. Patient advised to discuss non-opioid treatment plan with their provider. Functional ability and status Nutritional status Physical activity Advanced directives List of other physicians Hospitalizations, surgeries, and ER visits in previous 12 months Vitals Screenings to include cognitive, depression, and falls Referrals and appointments  In addition, I have reviewed and discussed with patient certain preventive protocols, quality metrics, and best practice recommendations. A written personalized care plan for preventive services as well as general preventive health recommendations were provided to patient.     Kandis Fantasia [place], [place]   [birth year]   After Visit Summary: (MyChart) Due to this being a telephonic visit, the after visit summary with patients personalized plan was offered to patient via MyChart   Nurse Notes: Patient is requesting refills of Tramadol and Trazodone; see telephone note.

## 2022-11-25 DIAGNOSIS — J4551 Severe persistent asthma with (acute) exacerbation: Secondary | ICD-10-CM | POA: Diagnosis not present

## 2022-11-27 ENCOUNTER — Ambulatory Visit: Payer: Medicare PPO

## 2022-11-27 DIAGNOSIS — I429 Cardiomyopathy, unspecified: Secondary | ICD-10-CM | POA: Diagnosis not present

## 2022-11-27 DIAGNOSIS — I5020 Unspecified systolic (congestive) heart failure: Secondary | ICD-10-CM

## 2022-11-27 NOTE — Progress Notes (Signed)
Office Visit Note  Patient: Ebony Scott             Date of Birth: [birth year]           MRN: [id number]             PCP: [name], MD Referring: [name], MD Visit Date: 12/11/2022   Subjective:  Follow-up (Patient states she recently had pneumonia twice. Patient states her right hand swells every morning. )   History of Present Illness: Ebony Scott is a 72 y.o. 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  Unfortunate since the last visit she had pneumonia twice with exacerbation of her chronic bronchitis and is currently at facility for rehab after hospitalization.  Steroid injection to the finger at her last visit did not help much with regards to pain swelling or joint mobility.  She still noticing some redness and sometimes warmth to the joints near the tips of her fingers.  Worst problem currently is left shoulder pain.  She has difficulty reaching overhead also has pain worse at nighttime sometimes disrupting sleep.  Does not have symptoms radiating down the arm.  Previous HPI 09/10/2022 Ebony Scott is a 72 y.o. 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  She had recent hospitalization for fevers and hypotension without clear infectious source identified but concern of HCAP as this followed shortly after ICD placement on 7/8. She was treated with rocephin and azithromycin and transitioned to cefadroxil and azithromycin at discharge. She completed antibiotics but still has some diarrhea with frequent, watery stools though without any blood or mucus contents. She thinks the left shoulder steroid injection in April helped, but is currently limited unable to move her shoulder much after the recent ICD placement. Worst joint at present is right 5th PIP joint is very painful with movement and sometimes at rest.   Previous HPI 05/22/2022 Ebony Scott is a 72 y.o. female here for follow up for seropositive RA on  prednisone 7 mg daily and we started hydroxychloroquine 200 mg daily.  Unfortunately did not see any significant benefit to joint inflammation with additional medication added.  She did get sick with community-acquired pneumonia resulting in hospitalization on March 6.  Respiratory status improved with treatment but unfortunately had a prolonged course 10 days until discharge.  Then had to go to short-term at rehab facility before returning home last weekend.  She was visited by home health physical therapy once on Easter Sunday but has not had any additional follow-up or treatment.  She feels overall weak and mobility is much worse compared to prior to hospitalization.  Also developed new erythematous skin rash involving abdomen and neck.   Previous HPI 03/07/22 Ebony Scott is a 72 y.o. 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 She has increased joint pain in multiple areas especially bilateral hands also left shoulder and right knee. She has seen only a small benefit in symptoms so far after the knee viscosupplementation shots last year. She has pain pretty much all the time both morning and night time. She notices some worsening of lateral deviation in the small finger on her right hand.    Previous HPI 11/29/21 Ebony Scott is a 72 y.o. female here for follow up for seronegative RA and cushing's syndrome currently on long term prednisone. She is on hizentra for hypogammaglobulinemia. Since last  visit she had a bone marrow biopsy for macrocytosis with benign findings. She is having an exacerbation of asthma with wheezing and coughing increased since early September and did not improve well with a prolonged steroid taper. Subsequently has not been prescribed azithromycin and higher dose steroid starting about 5 days ago so far still having a lot of coughing and symptoms. She notices increased joint pain in hands and knees during this episode. She completed a series of 3  viscosupplementation injections in the left knee about 2 weeks ago with a partial benefit.   Previous HPI 08/09/2021 Ebony Scott is a 72 y.o. female here for follow up or seronegative RA and cushing's syndrome on prednisone with gradual tapering but increased back to 7 mg daily after last visit.  She is having some increased joint pain at multiple areas but worst problem is increased pain in her left knee.  This is severe enough to limit her tolerance for weightbearing and mobility.  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  She has not had any severe interval infections.  She is scheduled for hammertoe corrective surgery next month.   Previous HPI 04/28/21 Ebony Scott is a 72 y.o. female here for follow up for seronegative RA and cushing's syndrome on prednisone with gradual tapering from 8 mg in December. She developed worsening iron deficiency anemia after GI bleed getting venofer replacement. Joint and skin problems were remaining pretty stable until about 2 weeks ot a month ago. This was after decreasing prednisone to 6 mg daily dose. Knee pain limits her mobility but hands have the most increase in swelling. Skin rash on the back of her neck and throughout her low back. She tried topical hydrocortisone without a large improvement so far.   Previous HPI 01/23/21 Ebony Scott is a 72 y.o. female here for follow up for seronegative RA minimally responsive to multiple DMARD treatments on prednisone 10 mg PO daily with acquired cushing's syndrome. She has noticed some increase in right hand MCP joint swelling but overall joints not dramatically changed with dose reduction. Since our visit she went for coronary artery stent placement after study for evaluating cardiomyopathy found significant stenosis. No anginal symptoms, she is not sure if any different but husband reports noticing some improvement in her dyspnea on exertion. She has had numerous other  issues increased upper airway congestion and drainage. Sometimes productive coughing with this. She has had mild nasal bleeding with rhinitis and and sneezing issues.   Previous HPI 11/28/20 Ebony Scott is a 72 y.o. female here for iatrogenic cushing syndrome and history of rheumatoid arthritis with long term prednisone treatment, previously seen by Dr. Deanne Coffer at [facility] for this problem. Mostly had been taking treatments after hospitalization with severe joint inflammation in 2020. She has tried taking numerous DMARD treatments for years including actemra, simponi, abatacept, and rituximab so far without great response or tolerance. Chronic prednisone required for symptoms throughout this time but has taken these longstanding for asthma disease already. She has had some issue with recurrent infections with pulmonary and septic bursitis and more recently noninfectious allergy symptoms managed with Dr. Lucie Leather and Francine Graven. She started on IVIG treatments with Dr. Lucie Leather for this identified recently, not sure if primary or related to previous medication   DMARD Hx Rituximab Abatacept Simponi Actemra IV   Review of Systems  Constitutional:  Positive for fatigue.  HENT:  Positive for mouth dryness. Negative for mouth sores.   Eyes:  Positive for dryness.  Respiratory:  Positive for shortness of breath.   Cardiovascular:  Negative for chest pain and palpitations.  Gastrointestinal:  Negative for blood in stool, constipation and diarrhea.  Endocrine: Negative for increased urination.  Genitourinary:  Negative for involuntary urination.  Musculoskeletal:  Positive for joint pain, joint pain, joint swelling, myalgias, morning stiffness and myalgias. Negative for gait problem, muscle weakness and muscle tenderness.  Skin:  Negative for color change, rash, hair loss and sensitivity to sunlight.  Allergic/Immunologic: Negative for susceptible to infections.  Neurological:  Negative for dizziness and  headaches.  Hematological:  Negative for swollen glands.  Psychiatric/Behavioral:  Positive for sleep disturbance. Negative for depressed mood. The patient is not nervous/anxious.     PMFS History:  Patient Active Problem List   Diagnosis Date Noted   CAP (community acquired pneumonia) 11/30/2022   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   Sepsis (HCC) 11/30/2022   Fever 09/01/2022   Lethargy 08/31/2022   Hypotension 08/31/2022   DNR (do not resuscitate) 08/31/2022   Abnormal CT of the abdomen 04/26/2022   Itching of ear 04/10/2022   Effusion, left knee 08/09/2021   Noninfectious otitis externa of left ear 06/08/2021   Rash and other nonspecific skin eruption 04/28/2021   Iron deficiency anemia 02/24/2021   GAVE (gastric antral vascular ectasia)    Symptomatic anemia 02/04/2021   Anticoagulated    Antiplatelet or antithrombotic long-term use    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   Coronary artery calcification seen on CAT scan 01/11/2021   Acute kidney injury superimposed on chronic kidney disease (HCC) 10/04/2020   Physical deconditioning 09/25/2020   History of DVT (deep vein thrombosis) 09/24/2020   DMII (diabetes mellitus, type 2) (HCC) 09/23/2020   Cellulitis of right hand 09/22/2020   Abscess of dorsum of right hand 09/22/2020   Hoarseness 09/09/2020   Metabolic encephalopathy 02/24/2020   Acute encephalopathy    AMS (altered mental status) 02/23/2020   S/P reverse total shoulder arthroplasty, right    Hypokalemia    DOE (dyspnea on exertion) 02/18/2020   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   S/P reverse total shoulder arthroplasty, left 02/16/2020   Preoperative clearance 01/26/2020   Closed displaced fracture of metatarsal bone of right foot 01/20/2019   History of pulmonary embolus (PE) 12/19/2018   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   Rheumatoid arthritis (HCC)    Bilateral impacted cerumen 08/18/2018   Fungal otitis externa 08/18/2018    Cerumen debris on tympanic membrane of right ear 08/18/2018   Asthmatic bronchitis with exacerbation 07/29/2018   Pulmonary embolism (HCC) 07/29/2018   Cellulitis of forearm    Fever of unknown origin (FUO) 02/10/2018   Diarrhea 02/10/2018   Abdominal pain 02/10/2018   Fracture of base of fifth metatarsal bone with routine healing, left    Closed fracture of fifth metatarsal bone of right foot, initial encounter 01/15/2018   DVT (deep venous thrombosis) (HCC) 01/14/2018   Hypothyroidism 01/14/2018   Depression 01/14/2018   Debility    Protein-calorie malnutrition, moderate (HCC) 12/17/2017   Knee pain    History of breast cancer    Chronic pain syndrome    Fibromyalgia    Graves disease    Vasculitis (HCC) 12/13/2017   Rhabdomyolysis 12/13/2017   Dysphonia 08/10/2017   Pulmonary nodules    Chronic kidney disease, stage 3b (HCC) 04/15/2016   Hx of adenomatous colonic polyps 04/12/2016   Cough 02/09/2016   Opacity of lung on imaging study 11/03/2015   Severe persistent asthma  02/08/2015   Facial pain syndrome 02/08/2015   Insomnia 02/08/2015   Other allergic rhinitis 02/08/2015   LPRD (laryngopharyngeal reflux disease) 02/08/2015   Chronic systolic CHF (congestive heart failure) (HCC) 02/08/2015   Diastolic dysfunction 02/08/2015   Benign paroxysmal positional vertigo 12/03/2012   Dizziness and giddiness 10/29/2012   Disequilibrium 10/29/2012   Pseudogout 02/24/2012   Irritable bowel syndrome 01/02/2010   Hyperlipidemia 12/23/2009   Hyperthyroidism 11/12/2006   Seasonal and perennial allergic rhinitis 11/12/2006   GERD 11/12/2006   NECK PAIN, CHRONIC 11/12/2006   OSTEOPOROSIS 11/12/2006   BREAST CANCER, HX OF 11/12/2006    Past Medical History:  Diagnosis Date   Anemia    Angio-edema    Breast cancer (HCC) 1998   Left breast, in remission   Bronchiectasis (HCC)    CHF (congestive heart failure) (HCC)    Clostridium difficile colitis 01/14/2018   Coronary  artery disease    Depression    "some; don't take anything for it" (02/20/2012), pt denies dx as of 06/02/21   Diverticulosis    DVT (deep venous thrombosis) (HCC)    Fibromyalgia 11/2011   GAVE (gastric antral vascular ectasia)    GERD (gastroesophageal reflux disease)    Headache(784.0)    "related to allergies; more at different times during the year" (02/20/2012)   Hemorrhoids    Hiatal hernia    back and neck   History of blood transfusion 2023   1 unit, 5 units of Iron   Hx of adenomatous colonic polyps 04/12/2016   Hypercholesteremia    good cholesterol is high   Hypothyroidism    h/o Graves disease   IBS (irritable bowel syndrome)    Moderate persistent asthma    -FeV1 72% 2011, -IgE 102 2011, CT sinus Neg 2011   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 Casimer Lanius   Sleep apnea    uses cpap nightly   Tracheobronchomalacia     Family History  Problem Relation Age of Onset   Allergies Mother    Heart disease Mother    Arthritis Mother    Lung cancer Mother        heavy smoker   Diabetes Mother    Allergies Father    Heart disease Father    Arthritis Father    Stroke Father    Heart disease Brother    Diabetes Maternal Grandfather    Colitis Daughter    Colon cancer Other        Maternal half uncle   Past Surgical History:  Procedure Laterality Date   ABDOMINAL HYSTERECTOMY N/A    Phreesia 10/21/2019   ANTERIOR AND POSTERIOR REPAIR  1990's   APPENDECTOMY     ARGON LASER APPLICATION  02/05/2021   Procedure: ARGON LASER APPLICATION;  Surgeon: Benancio Deeds, MD;  Location: [facility];  Service: Gastroenterology;;   BREAST LUMPECTOMY  1998   left   BREAST SURGERY N/A    Phreesia 10/21/2019   BRONCHIAL WASHINGS  04/05/2020   Procedure: BRONCHIAL WASHINGS;  Surgeon: Martina Sinner, MD;  Location: [facility];  Service: Pulmonary;;   CAPSULOTOMY Right 08/28/2021   Procedure: CAPSULOTOMY;  Surgeon: Candelaria Stagers, DPM;  Location: [facility];  Service: Podiatry;  Laterality: Right;   CARPOMETACARPEL (CMC) FUSION OF THUMB WITH AUTOGRAFT  FROM RADIUS  ~ 2009   "both thumbs" (02/20/2012)   CATARACT EXTRACTION W/ INTRAOCULAR LENS  IMPLANT, BILATERAL  2012   CERVICAL DISCECTOMY  10/2001   C5-C6   CERVICAL FUSION  2003   C3-C4   CHOLECYSTECTOMY     COLONOSCOPY     CORONARY STENT INTERVENTION N/A 01/11/2021   Procedure: CORONARY STENT INTERVENTION;  Surgeon: Marykay Lex, MD;  Location: [facility];  Service: Cardiovascular;  Laterality: N/A;   DEBRIDEMENT TENNIS ELBOW  ?1970's   right   ESOPHAGOGASTRODUODENOSCOPY     ESOPHAGOGASTRODUODENOSCOPY (EGD) WITH PROPOFOL N/A 02/05/2021   Procedure: ESOPHAGOGASTRODUODENOSCOPY (EGD) WITH PROPOFOL;  Surgeon: Benancio Deeds, MD;  Location: [facility];  Service: Gastroenterology;  Laterality: N/A;   EYE SURGERY N/A    Phreesia 10/21/2019   HAMMER TOE SURGERY Right 08/28/2021   Procedure: HAMMER TOE REPAIR 2ND TOE RIGHT FOOT;  Surgeon: Candelaria Stagers, DPM;  Location: [facility];  Service: Podiatry;  Laterality: Right;   HYSTERECTOMY     I & D EXTREMITY Right 09/22/2020   Procedure: IRRIGATION AND DEBRIDEMENT RIGHT HAND AND ELBOW;  Surgeon: Teryl Lucy, MD;  Location: [facility];  Service: Orthopedics;  Laterality: Right;   ICD IMPLANT N/A 08/27/2022   Procedure: ICD IMPLANT;  Surgeon: Maurice Small, MD;  Location: [facility] LAB;  Service: Cardiovascular;  Laterality: N/A;   KNEE ARTHROPLASTY  ?1990's   "?right; w/cartilage repair" (02/20/2012)   MASS EXCISION Left 08/28/2021   Procedure: EXCISION OF SOFT TISSUE  MASS LEFT FOOT;  Surgeon: Candelaria Stagers, DPM;  Location: [facility];  Service: Podiatry;  Laterality: Left;   NASAL SEPTUM SURGERY  1980's   POSTERIOR CERVICAL FUSION/FORAMINOTOMY  2004   "failed initial  fusion; rewired  anterior neck" (02/20/2012)   REVERSE SHOULDER ARTHROPLASTY Right 02/16/2020   Procedure: REVERSE SHOULDER ARTHROPLASTY;  Surgeon: Teryl Lucy, MD;  Location: [facility];  Service: Orthopedics;  Laterality: Right;   RIGHT/LEFT HEART CATH AND CORONARY ANGIOGRAPHY N/A 01/11/2021   Procedure: RIGHT/LEFT HEART CATH AND CORONARY ANGIOGRAPHY;  Surgeon: Marykay Lex, MD;  Location: [facility] INVASIVE CV LAB;  Service: Cardiovascular;  Laterality: N/A;   SPINE SURGERY N/A    Phreesia 10/21/2019   TONSILLECTOMY  ~ 1953   VESICOVAGINAL FISTULA CLOSURE W/ TAH  1988   VIDEO BRONCHOSCOPY Bilateral 08/23/2016   Procedure: VIDEO BRONCHOSCOPY WITH FLUORO;  Surgeon: Roslynn Amble, MD;  Location: [facility];  Service: Cardiopulmonary;  Laterality: Bilateral;   VIDEO BRONCHOSCOPY N/A 04/05/2020   Procedure: VIDEO BRONCHOSCOPY WITHOUT FLUORO;  Surgeon: Martina Sinner, MD;  Location: [facility];  Service: Pulmonary;  Laterality: N/A;   Social History   Social History Narrative   Patient lives at home alone. Patient  divorced.    Patient has her BS degree.   Right handed.   Caffeine- sometimes coffee.      [facility]:   Born in [place], [place]. She worked as a Armed forces operational officer. She has no pets currently. She does have indoor plants. Previously had mold in her home that was remediated. Carpet was removed.          Immunization History  Administered Date(s) Administered   DTaP 08/18/2013   Fluad Quad(high Dose 65+) 10/16/2018, 11/20/2019, 11/03/2020   Hepatitis A 09/04/2007, 03/02/2008   Hepatitis B 01/07/1985, 02/06/1985, 08/17/1985   Influenza Split 11/13/2010, 11/22/2011, 10/20/2012   Influenza Whole 11/14/2009, 11/21/2011   Influenza, High Dose Seasonal PF 11/09/2015, 11/27/2017   Influenza, Quadrivalent, Recombinant,  Inj, Pf 11/20/2019   Influenza,inj,Quad PF,6+ Mos 11/06/2013, 11/09/2014, 11/06/2016   Influenza-Unspecified 11/06/2021, 10/12/2022   Meningococcal  Conjugate 09/04/2007   Moderna Covid-19 Fall Seasonal Vaccine 29yrs & older 01/08/2022   PFIZER(Purple Top)SARS-COV-2 Vaccination 03/12/2019, 04/01/2019, 12/16/2019, 06/29/2020   Pfizer Covid-19 Vaccine Bivalent Booster 14yrs & up 03/02/2021   Pneumococcal Conjugate-13 05/18/2013   Pneumococcal Polysaccharide-23 01/05/1994, 11/28/2011, 05/27/2017   Respiratory Syncytial Virus Vaccine,Recomb Aduvanted(Arexvy) 11/06/2021   Td 07/24/1995, 03/16/2005   Tdap 08/18/2013   Zoster Recombinant(Shingrix) 09/18/2017   Zoster, Live 03/02/2008     Objective: Vital Signs: BP (!) 97/55 (BP Location: Right Arm, Patient Position: Sitting, Cuff Size: Normal)   Pulse 79   Resp 14   Ht 5' (1.524 m)   Wt 130 lb (59 kg)   BMI 25.39 kg/m    Physical Exam Eyes:     Conjunctiva/sclera: Conjunctivae normal.  Cardiovascular:     Rate and Rhythm: Normal rate and regular rhythm.  Pulmonary:     Effort: Pulmonary effort is normal.     Comments: Bibasilar inspiratory crackles Lymphadenopathy:     Cervical: No cervical adenopathy.  Skin:    General: Skin is warm and dry.     Findings: Bruising present.  Neurological:     Mental Status: She is alert.  Psychiatric:        Mood and Affect: Mood normal.      Musculoskeletal Exam:  Shoulder tenderness to pressure, left shoulder abduction limited, tenderness to pressure and movement, no palpable effusion Elbows full ROM no tenderness or swelling Wrists full ROM no tenderness or swelling Fingers chronic MCP joint changes, right 5th PIP widening and lateral deviation Knees full ROM no tenderness or swelling  Investigation: No additional findings.  Imaging: DG Chest Port 1 View  Result Date: 12/01/2022 CLINICAL DATA:  Sepsis.  Rule out pneumonia. EXAM: PORTABLE CHEST 1 VIEW COMPARISON:  11/29/2022 FINDINGS: There is a left chest wall ICD with lead in the right ventricle. Stable cardiomediastinal contours. No pleural fluid, interstitial edema or  airspace disease. Postsurgical change within the cervical spine. Previous right shoulder arthroplasty. IMPRESSION: No acute cardiopulmonary disease. Electronically Signed   By: Signa Kell M.D.   On: 12/01/2022 18:28   CT Renal Stone Study  Result Date: 11/30/2022 CLINICAL DATA:  Fever and weakness. Stones suspected. Sepsis. Flank pain. EXAM: CT ABDOMEN AND PELVIS WITHOUT CONTRAST TECHNIQUE: Multidetector CT imaging of the abdomen and pelvis was performed following the standard protocol without IV contrast. RADIATION DOSE REDUCTION: This exam was performed according to the departmental dose-optimization program which includes automated exposure control, adjustment of the mA and/or kV according to patient size and/or use of iterative reconstruction technique. COMPARISON:  CT abdomen and pelvis 04/25/2022 FINDINGS: Lower chest: Patchy opacities in the lower lungs. Small right pleural effusion. Hepatobiliary: Cholecystectomy.  No acute abnormality. Pancreas: Fatty atrophy.  No acute abnormality. Spleen: Unremarkable. Adrenals/Urinary Tract: Unremarkable adrenal glands. No urinary calculi or hydronephrosis. Bladder is within normal limits. Stomach/Bowel: Normal caliber large and small bowel. No bowel wall thickening. Stomach is within normal limits. Vascular/Lymphatic: Aortic atherosclerosis. No enlarged abdominal or pelvic lymph nodes. Reproductive: Hysterectomy. Other: No free intraperitoneal fluid or air. Musculoskeletal: No acute fracture. IMPRESSION: 1. No acute abnormality in the abdomen or pelvis. 2. Patchy opacities in the lower lungs with small right pleural effusion. Findings are concerning for pneumonia. Aortic Atherosclerosis (ICD10-I70.0). Electronically Signed   By: Minerva Fester M.D.   On: 11/30/2022 01:03   CT Head Wo Contrast  Result Date: 11/30/2022 CLINICAL DATA:  Mental status change, unknown cause EXAM: CT HEAD WITHOUT CONTRAST TECHNIQUE: Contiguous axial images were obtained from the  base of the skull through the vertex without intravenous contrast. RADIATION DOSE REDUCTION: This exam was performed according to the departmental dose-optimization program which includes automated exposure control, adjustment of the mA and/or kV according to patient size and/or use of iterative reconstruction technique. COMPARISON:  MRI head 12/17/2012 FINDINGS: Brain: Cerebral ventricle sizes are concordant with the degree of cerebral volume loss. Patchy and confluent areas of decreased attenuation are noted throughout the deep and periventricular white matter of the cerebral hemispheres bilaterally, compatible with chronic microvascular ischemic disease. No evidence of large-territorial acute infarction. No parenchymal hemorrhage. No mass lesion. No extra-axial collection. No mass effect or midline shift. No hydrocephalus. Basilar cisterns are patent. Vascular: No hyperdense vessel. Skull: No acute fracture or focal lesion. Sinuses/Orbits: Left sphenoid sinus mucosal thickening. Otherwise paranasal sinuses and mastoid air cells are clear. Bilateral lens replacement. Otherwise the orbits are unremarkable. Other: None. IMPRESSION: No acute intracranial abnormality. Electronically Signed   By: Tish Frederickson M.D.   On: 11/30/2022 01:01   DG Chest Port 1 View  Result Date: 11/29/2022 CLINICAL DATA:  Questionable sepsis - evaluate for abnormality Cough. EXAM: PORTABLE CHEST 1 VIEW COMPARISON:  Radiograph 09/11/2022 FINDINGS: Lung volumes are low. 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 Chronic elevation of right hemidiaphragm. The heart is upper normal in size, accentuated by technique. No focal airspace disease, pleural effusion or pneumothorax. Surgical clips in the left chest wall. Reverse right shoulder arthroplasty. IMPRESSION: 1. Low lung volumes. No acute chest findings. 2. Chronic elevation of right hemidiaphragm. Electronically Signed   By: Narda Rutherford M.D.   On: 11/29/2022 23:52   CUP PACEART  REMOTE DEVICE CHECK  Result Date: 11/28/2022 Scheduled remote reviewed. Normal device function.  Next remote 91 days. LA, CV[facility]   Recent Labs: Lab Results  Component Value Date   WBC 9.4 12/06/2022   HGB 10.4 (L) 12/06/2022   PLT 193 12/06/2022   NA 139 12/06/2022   K 4.0 12/06/2022   CL 106 12/06/2022   CO2 21 (L) 12/06/2022   GLUCOSE 86 12/06/2022   BUN 26 (H) 12/06/2022   CREATININE 0.99 12/06/2022   BILITOT 0.5 12/06/2022   ALKPHOS 55 12/06/2022   AST 56 (H) 12/06/2022   ALT 56 (H) 12/06/2022   PROT 6.9 12/06/2022   ALBUMIN 2.7 (L) 12/06/2022   CALCIUM 9.0 12/06/2022   GFRAA 40 (L) 08/25/2020    Speciality Comments: No specialty comments available.  Procedures:  Large Joint Inj: L glenohumeral on 12/11/2022 4:00 PM Indications: pain Details: 27 G 1.5 in needle, posterior approach Medications: 2 mL lidocaine 1 %; 40 mg triamcinolone acetonide 40 MG/ML Outcome: tolerated well, no immediate complications Procedure, treatment alternatives, risks and benefits explained, specific risks discussed. Consent was given by the patient. Immediately prior to procedure a time out was called to verify the correct patient, procedure, equipment, support staff and site/side marked as required. Patient was prepped and draped in the usual sterile fashion.     Allergies: Baclofen, Dust mite extract, Molds & smuts, Morphine and codeine, Other, Penicillins, Rofecoxib, Shellfish allergy, Shrimp extract, Tetracycline hcl, Tetracycline hcl, Xolair [omalizumab], Zoledronic acid, Dilaudid [hydromorphone hcl], Hydrocodone-acetaminophen, Levofloxacin, Oxycodone hcl, Paroxetine, Celecoxib, Diltiazem hcl, Lactose, Oxycodone-acetaminophen, Pork-derived products, Rituximab, Tree extract, Gamunex [immune globulin], and Penicillin g procaine   Assessment / Plan:     Visit Diagnoses: Rheumatoid arthritis with  negative rheumatoid factor, involving unspecified site [facility])  - Plan: Large Joint Inj: L  glenohumeral  Continued joint pain in multiple areas describes some more inflammatory episodes in the fingers but looks about baseline on exam today with chronic deformity and Heberden's nodes.  Not a good candidate for additional DMARD treatment with immunodeficiency and recurrent infections.  Continuing on prednisone 7 mg daily.  Left shoulder injection today for increased pain with use and rest and sleep disruption.  Long term (current) use of systemic steroids - prednisone 7 mg daily.  Rash and other nonspecific skin eruption - triamcinolone 0.1%  Diarrhea, unspecified type - Recommend addition of oral probiotic as well as active cultures supplement such as yogurt or kefir.  Fibromyalgia - Plan: DULoxetine (CYMBALTA) 20 MG capsule  Ongoing pain in multiple areas with mixture of inflammatory and degenerative arthritis but also muscular pain and deconditioning.  Recommend trial of adding duloxetine 20 mg daily for the myofascial pain.  Discussed possible sedation or serotonergic overlap with the tramadol and/or trazodone so we will try taking in the morning and starting at the lowest dose.  Orders: Orders Placed This Encounter  Procedures   Large Joint Inj: L glenohumeral   Meds ordered this encounter  Medications   DULoxetine (CYMBALTA) 20 MG capsule    Sig: Take 1 capsule (20 mg total) by mouth daily.     Follow-Up Instructions: Return in about 3 months (around 03/13/2023) for RA on GC/cymbalta/inj f/u 3mos.   Fuller Plan, MD  Note - This record has been created using [employer].  Chart creation errors have been sought, but may not always  have been located. Such creation errors do not reflect on  the standard of medical care.

## 2022-11-28 LAB — CUP PACEART REMOTE DEVICE CHECK
Battery Remaining Longevity: 112 mo
Battery Remaining Percentage: 95.5 %
Battery Voltage: 3.07 V
Brady Statistic RV Percent Paced: 1 %
Date Time Interrogation Session: 20241008020343
HighPow Impedance: 72 Ohm
Implantable Lead Connection Status: 753985
Implantable Lead Implant Date: 20240708
Implantable Lead Location: 753860
Implantable Pulse Generator Implant Date: 20240708
Lead Channel Impedance Value: 510 Ohm
Lead Channel Pacing Threshold Amplitude: 0.5 V
Lead Channel Pacing Threshold Pulse Width: 0.5 ms
Lead Channel Sensing Intrinsic Amplitude: 12 mV
Lead Channel Setting Pacing Amplitude: 3.5 V
Lead Channel Setting Pacing Pulse Width: 0.5 ms
Lead Channel Setting Sensing Sensitivity: 0.5 mV
Pulse Gen Serial Number: 211016307
Zone Setting Status: 755011

## 2022-11-29 ENCOUNTER — Emergency Department (HOSPITAL_COMMUNITY): Payer: Medicare PPO

## 2022-11-29 ENCOUNTER — Inpatient Hospital Stay (HOSPITAL_COMMUNITY)
Admission: EM | Admit: 2022-11-29 | Discharge: 2022-12-06 | DRG: 871 | Disposition: A | Discharge status: Skilled Nursing Facility | Payer: Medicare PPO | Attending: Internal Medicine | Admitting: Internal Medicine

## 2022-11-29 ENCOUNTER — Encounter (HOSPITAL_COMMUNITY): Payer: Self-pay

## 2022-11-29 DIAGNOSIS — R6520 Severe sepsis without septic shock: Secondary | ICD-10-CM | POA: Diagnosis present

## 2022-11-29 DIAGNOSIS — M069 Rheumatoid arthritis, unspecified: Secondary | ICD-10-CM | POA: Diagnosis present

## 2022-11-29 DIAGNOSIS — I2510 Atherosclerotic heart disease of native coronary artery without angina pectoris: Secondary | ICD-10-CM | POA: Diagnosis present

## 2022-11-29 DIAGNOSIS — Z801 Family history of malignant neoplasm of trachea, bronchus and lung: Secondary | ICD-10-CM

## 2022-11-29 DIAGNOSIS — Z833 Family history of diabetes mellitus: Secondary | ICD-10-CM

## 2022-11-29 DIAGNOSIS — K8689 Other specified diseases of pancreas: Secondary | ICD-10-CM | POA: Diagnosis not present

## 2022-11-29 DIAGNOSIS — E119 Type 2 diabetes mellitus without complications: Secondary | ICD-10-CM

## 2022-11-29 DIAGNOSIS — M0600 Rheumatoid arthritis without rheumatoid factor, unspecified site: Secondary | ICD-10-CM | POA: Diagnosis not present

## 2022-11-29 DIAGNOSIS — Z66 Do not resuscitate: Secondary | ICD-10-CM | POA: Diagnosis present

## 2022-11-29 DIAGNOSIS — E039 Hypothyroidism, unspecified: Secondary | ICD-10-CM | POA: Diagnosis present

## 2022-11-29 DIAGNOSIS — R059 Cough, unspecified: Secondary | ICD-10-CM | POA: Diagnosis not present

## 2022-11-29 DIAGNOSIS — E785 Hyperlipidemia, unspecified: Secondary | ICD-10-CM | POA: Diagnosis not present

## 2022-11-29 DIAGNOSIS — D801 Nonfamilial hypogammaglobulinemia: Secondary | ICD-10-CM | POA: Diagnosis present

## 2022-11-29 DIAGNOSIS — Z96611 Presence of right artificial shoulder joint: Secondary | ICD-10-CM | POA: Diagnosis not present

## 2022-11-29 DIAGNOSIS — Z823 Family history of stroke: Secondary | ICD-10-CM

## 2022-11-29 DIAGNOSIS — R4182 Altered mental status, unspecified: Secondary | ICD-10-CM | POA: Diagnosis not present

## 2022-11-29 DIAGNOSIS — Z800 Family history of malignant neoplasm of digestive organs: Secondary | ICD-10-CM

## 2022-11-29 DIAGNOSIS — R17 Unspecified jaundice: Secondary | ICD-10-CM | POA: Diagnosis not present

## 2022-11-29 DIAGNOSIS — Z981 Arthrodesis status: Secondary | ICD-10-CM

## 2022-11-29 DIAGNOSIS — A419 Sepsis, unspecified organism: Principal | ICD-10-CM | POA: Diagnosis present

## 2022-11-29 DIAGNOSIS — M545 Low back pain, unspecified: Secondary | ICD-10-CM | POA: Diagnosis present

## 2022-11-29 DIAGNOSIS — R1310 Dysphagia, unspecified: Secondary | ICD-10-CM | POA: Diagnosis present

## 2022-11-29 DIAGNOSIS — I10 Essential (primary) hypertension: Secondary | ICD-10-CM | POA: Diagnosis not present

## 2022-11-29 DIAGNOSIS — R41841 Cognitive communication deficit: Secondary | ICD-10-CM | POA: Diagnosis not present

## 2022-11-29 DIAGNOSIS — Z955 Presence of coronary angioplasty implant and graft: Secondary | ICD-10-CM

## 2022-11-29 DIAGNOSIS — M810 Age-related osteoporosis without current pathological fracture: Secondary | ICD-10-CM | POA: Diagnosis present

## 2022-11-29 DIAGNOSIS — I5022 Chronic systolic (congestive) heart failure: Secondary | ICD-10-CM | POA: Diagnosis present

## 2022-11-29 DIAGNOSIS — M797 Fibromyalgia: Secondary | ICD-10-CM | POA: Diagnosis present

## 2022-11-29 DIAGNOSIS — G9340 Encephalopathy, unspecified: Secondary | ICD-10-CM | POA: Diagnosis present

## 2022-11-29 DIAGNOSIS — Z79899 Other long term (current) drug therapy: Secondary | ICD-10-CM

## 2022-11-29 DIAGNOSIS — R0902 Hypoxemia: Secondary | ICD-10-CM | POA: Diagnosis not present

## 2022-11-29 DIAGNOSIS — Z7952 Long term (current) use of systemic steroids: Secondary | ICD-10-CM

## 2022-11-29 DIAGNOSIS — E1122 Type 2 diabetes mellitus with diabetic chronic kidney disease: Secondary | ICD-10-CM | POA: Diagnosis present

## 2022-11-29 DIAGNOSIS — Z7401 Bed confinement status: Secondary | ICD-10-CM | POA: Diagnosis not present

## 2022-11-29 DIAGNOSIS — J470 Bronchiectasis with acute lower respiratory infection: Secondary | ICD-10-CM | POA: Diagnosis present

## 2022-11-29 DIAGNOSIS — R9389 Abnormal findings on diagnostic imaging of other specified body structures: Secondary | ICD-10-CM | POA: Diagnosis not present

## 2022-11-29 DIAGNOSIS — G9341 Metabolic encephalopathy: Secondary | ICD-10-CM | POA: Diagnosis present

## 2022-11-29 DIAGNOSIS — Z86711 Personal history of pulmonary embolism: Secondary | ICD-10-CM

## 2022-11-29 DIAGNOSIS — R262 Difficulty in walking, not elsewhere classified: Secondary | ICD-10-CM | POA: Diagnosis not present

## 2022-11-29 DIAGNOSIS — I959 Hypotension, unspecified: Secondary | ICD-10-CM | POA: Diagnosis not present

## 2022-11-29 DIAGNOSIS — Z7902 Long term (current) use of antithrombotics/antiplatelets: Secondary | ICD-10-CM

## 2022-11-29 DIAGNOSIS — Z7901 Long term (current) use of anticoagulants: Secondary | ICD-10-CM

## 2022-11-29 DIAGNOSIS — Z7984 Long term (current) use of oral hypoglycemic drugs: Secondary | ICD-10-CM | POA: Diagnosis not present

## 2022-11-29 DIAGNOSIS — Z8261 Family history of arthritis: Secondary | ICD-10-CM

## 2022-11-29 DIAGNOSIS — Z1152 Encounter for screening for COVID-19: Secondary | ICD-10-CM

## 2022-11-29 DIAGNOSIS — Z853 Personal history of malignant neoplasm of breast: Secondary | ICD-10-CM

## 2022-11-29 DIAGNOSIS — K219 Gastro-esophageal reflux disease without esophagitis: Secondary | ICD-10-CM | POA: Diagnosis present

## 2022-11-29 DIAGNOSIS — J189 Pneumonia, unspecified organism: Secondary | ICD-10-CM | POA: Diagnosis present

## 2022-11-29 DIAGNOSIS — J4540 Moderate persistent asthma, uncomplicated: Secondary | ICD-10-CM | POA: Diagnosis present

## 2022-11-29 DIAGNOSIS — R509 Fever, unspecified: Secondary | ICD-10-CM | POA: Diagnosis not present

## 2022-11-29 DIAGNOSIS — Z794 Long term (current) use of insulin: Secondary | ICD-10-CM | POA: Diagnosis not present

## 2022-11-29 DIAGNOSIS — Z90710 Acquired absence of both cervix and uterus: Secondary | ICD-10-CM

## 2022-11-29 DIAGNOSIS — N1832 Chronic kidney disease, stage 3b: Secondary | ICD-10-CM | POA: Diagnosis present

## 2022-11-29 DIAGNOSIS — Z7951 Long term (current) use of inhaled steroids: Secondary | ICD-10-CM

## 2022-11-29 DIAGNOSIS — M059 Rheumatoid arthritis with rheumatoid factor, unspecified: Secondary | ICD-10-CM | POA: Diagnosis not present

## 2022-11-29 DIAGNOSIS — Z8249 Family history of ischemic heart disease and other diseases of the circulatory system: Secondary | ICD-10-CM | POA: Diagnosis not present

## 2022-11-29 DIAGNOSIS — G8929 Other chronic pain: Secondary | ICD-10-CM | POA: Diagnosis present

## 2022-11-29 DIAGNOSIS — Z860101 Personal history of adenomatous and serrated colon polyps: Secondary | ICD-10-CM

## 2022-11-29 DIAGNOSIS — Z96651 Presence of right artificial knee joint: Secondary | ICD-10-CM | POA: Diagnosis present

## 2022-11-29 DIAGNOSIS — R109 Unspecified abdominal pain: Secondary | ICD-10-CM | POA: Diagnosis not present

## 2022-11-29 DIAGNOSIS — Z86718 Personal history of other venous thrombosis and embolism: Secondary | ICD-10-CM

## 2022-11-29 DIAGNOSIS — E7800 Pure hypercholesterolemia, unspecified: Secondary | ICD-10-CM | POA: Diagnosis present

## 2022-11-29 DIAGNOSIS — E1169 Type 2 diabetes mellitus with other specified complication: Secondary | ICD-10-CM | POA: Diagnosis not present

## 2022-11-29 DIAGNOSIS — R0600 Dyspnea, unspecified: Secondary | ICD-10-CM | POA: Diagnosis not present

## 2022-11-29 DIAGNOSIS — I8291 Chronic embolism and thrombosis of unspecified vein: Secondary | ICD-10-CM | POA: Diagnosis not present

## 2022-11-29 DIAGNOSIS — R5381 Other malaise: Secondary | ICD-10-CM | POA: Diagnosis present

## 2022-11-29 DIAGNOSIS — M6281 Muscle weakness (generalized): Secondary | ICD-10-CM | POA: Diagnosis not present

## 2022-11-29 DIAGNOSIS — R0989 Other specified symptoms and signs involving the circulatory and respiratory systems: Secondary | ICD-10-CM | POA: Diagnosis not present

## 2022-11-29 LAB — CBC WITH DIFFERENTIAL/PLATELET
Abs Immature Granulocytes: 0.06 10*3/uL (ref 0.00–0.07)
Basophils Absolute: 0.1 10*3/uL (ref 0.0–0.1)
Basophils Relative: 0 %
Eosinophils Absolute: 0.4 10*3/uL (ref 0.0–0.5)
Eosinophils Relative: 3 %
HCT: 41.6 % (ref 36.0–46.0)
Hemoglobin: 13.3 g/dL (ref 12.0–15.0)
Immature Granulocytes: 0 %
Lymphocytes Relative: 13 %
Lymphs Abs: 1.9 10*3/uL (ref 0.7–4.0)
MCH: 34.9 pg — ABNORMAL HIGH (ref 26.0–34.0)
MCHC: 32 g/dL (ref 30.0–36.0)
MCV: 109.2 fL — ABNORMAL HIGH (ref 80.0–100.0)
Monocytes Absolute: 2 10*3/uL — ABNORMAL HIGH (ref 0.1–1.0)
Monocytes Relative: 14 %
Neutro Abs: 10.3 10*3/uL — ABNORMAL HIGH (ref 1.7–7.7)
Neutrophils Relative %: 70 %
Platelets: 188 10*3/uL (ref 150–400)
RBC: 3.81 MIL/uL — ABNORMAL LOW (ref 3.87–5.11)
RDW: 16.6 % — ABNORMAL HIGH (ref 11.5–15.5)
WBC: 14.7 10*3/uL — ABNORMAL HIGH (ref 4.0–10.5)
nRBC: 0 % (ref 0.0–0.2)

## 2022-11-29 LAB — PROTIME-INR
INR: 1.1 (ref 0.8–1.2)
Prothrombin Time: 14 s (ref 11.4–15.2)

## 2022-11-29 LAB — APTT: aPTT: 27 s (ref 24–36)

## 2022-11-29 LAB — I-STAT CG4 LACTIC ACID, ED: Lactic Acid, Venous: 1.3 mmol/L (ref 0.5–1.9)

## 2022-11-29 MED ORDER — ACETAMINOPHEN 500 MG PO TABS
1000.0000 mg | ORAL_TABLET | Freq: Once | ORAL | Status: AC
  Administered 2022-11-29: 1000 mg via ORAL
  Filled 2022-11-29: qty 2

## 2022-11-29 MED ORDER — LACTATED RINGERS IV BOLUS (SEPSIS)
1000.0000 mL | Freq: Once | INTRAVENOUS | Status: AC
  Administered 2022-11-29: 1000 mL via INTRAVENOUS

## 2022-11-29 MED ORDER — LACTATED RINGERS IV BOLUS
1000.0000 mL | Freq: Once | INTRAVENOUS | Status: AC
  Administered 2022-11-29: 1000 mL via INTRAVENOUS

## 2022-11-29 MED ORDER — SODIUM CHLORIDE 0.9 % IV SOLN
2.0000 g | INTRAVENOUS | Status: AC
  Administered 2022-11-29 – 2022-12-05 (×7): 2 g via INTRAVENOUS
  Filled 2022-11-29 (×7): qty 20

## 2022-11-29 MED ORDER — LACTATED RINGERS IV SOLN: INTRAVENOUS | Status: DC

## 2022-11-29 NOTE — ED Provider Notes (Signed)
[place] EMERGENCY DEPARTMENT AT [facility] Provider Note   CSN: [id number] Arrival date & time: 11/29/22  2221     History  Chief Complaint  Patient presents with   Weakness    Ebony Scott is a 72 y.o. female.  Level 5 caveat for altered mental status.  Patient with a history of asthma, hypothyroidism, fibromyalgia, DVT, breast cancer, anemia, bronchiectasis presenting from home with fever and confusion.  Has been sick for 2 or 3 days.  Patient unable to give any history.  She reports fever and weakness at home with cough, congestion and ear pain.  She is found lying in a puddle of urine at home.  She is oriented to person and place.  Complains of low back pain which is chronic.  Denies any chest pain or shortness of breath.  Denies any pain with urination or blood in the urine.  Denies any fever but is febrile to 102.6 on arrival.  Unable to cough up anything.  Denies feeling short of breath.  The history is provided by the patient.  Weakness      Home Medications Prior to Admission medications   Medication Sig Start Date End Date Taking? Authorizing Provider  acetaminophen (TYLENOL) 500 MG tablet Take 500 mg by mouth as needed for moderate pain or fever.    [provider]  acetic acid-hydrocortisone (VOSOL-HC) OTIC solution Place 4 drops into both ears See admin instructions. Chloramp/sulfu/ampho/ampho/B/H drop 100-100-5 mg Instill 4 drops into both ears the first 5 days of the month, then as directed/as needed for itching    [provider]  albuterol (VENTOLIN HFA) 108 (90 Base) MCG/ACT inhaler Inhale 2 puffs into the lungs every 6 (six) hours as needed for wheezing or shortness of breath. 03/01/21   Kozlow, [name], MD  allopurinol (ZYLOPRIM) 100 MG tablet TAKE 1 TABLET BY MOUTH DAILY 11/01/22   [name], MD  Alpha-D-Galactosidase (BEANO PO) Take 2-3 tablets by mouth 3 (three) times daily as needed (Gas).    [provider]   Alpha-Lipoic Acid 600 MG TABS Take 600 mg by mouth daily.    [provider]  atorvastatin (LIPITOR) 80 MG tablet TAKE 1 TABLET BY MOUTH DAILY 11/01/22   [name], MD  azelastine (ASTELIN) 0.1 % nasal spray Place 2 sprays into both nostrils daily. Use in each nostril as directed Patient taking differently: Place 2 sprays into both nostrils at bedtime as needed for allergies or rhinitis. Use in each nostril as directed 06/13/21   Kozlow, [name], MD  benzonatate (TESSALON) 200 MG capsule TAKE 1 CAPSULE BY MOUTH THREE TIMES DAILY AS NEEDED FOR COUGH Patient taking differently: Take 200 mg by mouth 2 (two) times daily as needed for cough. 07/13/21   [name], MD  budesonide (PULMICORT) 0.5 MG/2ML nebulizer solution Take 2 mLs (0.5 mg total) by nebulization 2 (two) times daily. 07/20/22   Martina Sinner, MD  Calcium Citrate (CITRACAL PO) Take 1 tablet by mouth daily. With magnesium    [provider]  cetirizine (ZYRTEC) 10 MG tablet Take 1 tablet (10 mg total) by mouth 2 (two) times daily as needed for allergies (Can take a n extra dose during flare ups.). Patient taking differently: Take 10 mg by mouth at bedtime. 06/13/21   Kozlow, [name], MD  Cholecalciferol (VITAMIN D3) 25 MCG (1000 UT) capsule Take 1,000 Units by mouth at bedtime.    [provider]  clopidogrel (PLAVIX)  75 MG tablet Take 1 tablet (75 mg total) by mouth daily. 05/11/22   Jodelle Red, MD  cyproheptadine (PERIACTIN) 4 MG tablet Take 1 tablet (4 mg total) by mouth 3 (three) times daily as needed for allergies. Patient taking differently: Take 12 mg by mouth at bedtime. 08/10/22   Kozlow, [name], MD  denosumab (PROLIA) 60 MG/ML SOSY injection Inject 60 mg into the skin every 6 (six) months.    [provider]  dexlansoprazole (DEXILANT) 60 MG capsule Take 60 mg by mouth daily.    [provider]  empagliflozin (JARDIANCE) 10 MG TABS tablet Take 1 tablet (10 mg  total) by mouth daily before breakfast. 04/11/22   [name], MD  EPINEPHRINE 0.3 mg/0.3 mL IJ SOAJ injection USE AS DIRECTED BY YOUR PHYSICIAN INTRAMUSCULARLY AS NEEDED FOR ANAPHYLAXIS 10/09/22   Kozlow, [name], MD  famotidine (PEPCID) 20 MG tablet Take 1 tablet (20 mg total) by mouth daily. TAKE 1 TABLET BY MOUTH EACH NIGHT AT BEDTIME Strength: 20 mg 08/07/22   Kozlow, [name], MD  folic acid (FOLVITE) 1 MG tablet Take 1 tablet (1 mg total) by mouth daily. 02/16/22   [name], MD  furosemide (LASIX) 40 MG tablet DAILY AS NEEDED FOR WEIGHT GAIN OF 2 POUNDS OVERNIGHT OR 5 POUNDS IN ONE WEEK 07/12/22   Alver Sorrow, NP  gabapentin (NEURONTIN) 300 MG capsule Take 1 capsule (300 mg total) by mouth at bedtime. Patient taking differently: Take 300 mg by mouth at bedtime. Can take up to three time a say as needed 05/04/22   Lewie Chamber, MD  GOODSENSE ARTHRITIS PAIN 1 % GEL Apply 1 Application topically 4 times daily as needed for pain. Patient taking differently: Diclofenac 05/10/22   [name], MD  guaiFENesin (MUCINEX) 600 MG 12 hr tablet Take 600 mg by mouth 2 (two) times daily.    [provider]  HIZENTRA 10 GM/50ML SOLN Inject 65 mLs into the skin once a week. 13 g Infusion 08/01/22   [provider]  ipratropium-albuterol (DUONEB) 0.5-2.5 (3) MG/3ML SOLN Take 3 mLs by nebulization every 6 (six) hours as needed. 11/14/21   Martina Sinner, MD  isosorbide-hydrALAZINE (BIDIL) 20-37.5 MG tablet Take 1 tablet by mouth 2 (two) times daily. 07/12/22   Alver Sorrow, NP  Lactase (LACTOSE FAST ACTING RELIEF PO) Take 3 tablets by mouth 3 (three) times daily before meals. For dairy food or drink    [provider]  lidocaine-prilocaine (EMLA) cream Apply 1 application  topically as needed (pain). 04/18/21   [provider]  magnesium oxide (MAG-OX) 400 (240 Mg) MG tablet Take 400 mg by mouth at bedtime.    [provider]  mirtazapine  (REMERON SOL-TAB) 30 MG disintegrating tablet Take 1 tablet by mouth at bedtime as needed for sleep. 11/01/22   [name], MD  montelukast (SINGULAIR) 10 MG tablet Take 1 tablet (10 mg total) by mouth daily after supper. 07/30/22   [name], MD  mupirocin ointment (BACTROBAN) 2 % Apply 1 Application topically at bedtime.    [provider]  Nystatin (GERHARDT'S BUTT CREAM) CREA Apply topically 3 times a day as needed for itching or irritation 12/30/20   [name], MD  nystatin (MYCOSTATIN) 100000 UNIT/ML suspension Use as directed 15 mLs in the mouth or throat at bedtime as needed (if has yeast in mouth).    [provider]  nystatin-triamcinolone ointment (MYCOLOG) Apply 1  Application topically daily as needed (Vaginal irritation).    [provider]  predniSONE (DELTASONE) 1 MG tablet TAKE 2 TABLETS (2MG ) BY MOUTH ONCE DAILY WITH BREAKFAST. TAKE ALONG WITH 5MG  DAILY 11/01/22   Rice, [name], MD  predniSONE (DELTASONE) 5 MG tablet tAKE 1 TABLET (5MG ) BY MOUTH ONCE DAILY WITH BREAKFAST 11/01/22   Rice, [name], MD  Prenatal Vit-Fe Fumarate-FA (PRENATAL VITAMINS PO) Take 1 tablet by mouth daily.    [provider]  Probiotic Product (ALIGN) 4 MG CAPS Take 4 mg by mouth daily.    [provider]  Propylene Glycol (SYSTANE COMPLETE) 0.6 % SOLN Place 1 drop into both eyes 2 (two) times daily. 03/01/21   Kozlow, [name], MD  sacubitril-valsartan (ENTRESTO) 24-26 MG Take 1 tablet by mouth 2 (two) times daily. 08/13/22   Jodelle Red, MD  Tiotropium Bromide-Olodaterol (STIOLTO RESPIMAT) 2.5-2.5 MCG/ACT AERS Inhale 2 puffs into the lungs daily. Patient taking differently: Inhale 2 puffs into the lungs at bedtime. 07/20/22   Martina Sinner, MD  traMADol (ULTRAM) 50 MG tablet Take 1 tablet (50 mg total) by mouth at bedtime. 11/22/22   [name], MD  traZODone (DESYREL) 50 MG tablet Take 1 tablet (50 mg total) by mouth  at bedtime. 11/22/22   [name], MD  Triamcinolone Acetonide (TRIAMCINOLONE 0.1 % CREAM : EUCERIN) CREA Apply 1 Application topically 2 (two) times daily. Patient taking differently: Apply 1 Application topically daily as needed for rash or irritation. 05/21/22   [name], MD  triamcinolone ointment (KENALOG) 0.5 % Apply 1 Application topically 2 (two) times daily. 06/14/22   Jaci Standard, MD  XARELTO 20 MG TABS tablet TAKE 1 TABLET BY MOUTH DAILY WITH SUPPER Patient taking differently: Take 20 mg by mouth daily with supper. 02/15/22   [name], MD      Allergies    Baclofen, Dust mite extract, Molds & smuts, Morphine and codeine, Other, Penicillins, Rofecoxib, Shellfish allergy, Shrimp extract, Tetracycline hcl, Tetracycline hcl, Xolair [omalizumab], Zoledronic acid, Dilaudid [hydromorphone hcl], Hydrocodone-acetaminophen, Levofloxacin, Oxycodone hcl, Oxycodone hcl, Paroxetine, Celecoxib, Diltiazem hcl, Lactose, Lactose intolerance (gi), Oxycodone-acetaminophen, Rituximab, Tree extract, Gamunex [immune globulin], and Penicillin g procaine    Review of Systems   Review of Systems  Neurological:  Positive for weakness.   all other systems are negative except as noted in the HPI and PMH.    Physical Exam Updated Vital Signs BP (!) 155/66 (BP Location: Right Arm)   Pulse 89   Temp (!) 102.6 F (39.2 C) (Oral)   Resp 17   Ht 5' (1.524 m)   Wt 59 kg   SpO2 93%   BMI 25.39 kg/m  Physical Exam Vitals and nursing note reviewed.  Constitutional:      General: She is not in acute distress.    Appearance: She is well-developed.     Comments: Resting with eyes closed, no distress  HENT:     Head: Normocephalic and atraumatic.     Mouth/Throat:     Pharynx: No oropharyngeal exudate.  Eyes:     Conjunctiva/sclera: Conjunctivae normal.     Pupils: Pupils are equal, round, and reactive to light.  Neck:     Comments: No meningismus. Cardiovascular:     Rate and  Rhythm: Normal rate and regular rhythm.     Heart sounds: Normal heart sounds. No murmur heard. Pulmonary:     Effort: Pulmonary effort is normal. No respiratory distress.  Breath sounds: Rhonchi present.  Abdominal:     Palpations: Abdomen is soft.     Tenderness: There is no abdominal tenderness. There is no guarding or rebound.  Musculoskeletal:        General: Tenderness present. Normal range of motion.     Cervical back: Normal range of motion and neck supple.     Comments: Paraspinal tenderness bilaterally  Skin:    General: Skin is warm.  Neurological:     Mental Status: She is alert and oriented to person, place, and time.     Cranial Nerves: No cranial nerve deficit.     Motor: No abnormal muscle tone.     Coordination: Coordination normal.     Comments:  5/5 strength throughout. CN 2-12 intact.Equal grip strength.   Psychiatric:        Behavior: Behavior normal.     ED Results / Procedures / Treatments   Labs (all labs ordered are listed, but only abnormal results are displayed) Labs Reviewed  CBC WITH DIFFERENTIAL/PLATELET - Abnormal; Notable for the following components:      Result Value   WBC 14.7 (*)    RBC 3.81 (*)    MCV 109.2 (*)    MCH 34.9 (*)    RDW 16.6 (*)    Neutro Abs 10.3 (*)    Monocytes Absolute 2.0 (*)    All other components within normal limits  URINALYSIS, W/ REFLEX TO CULTURE (INFECTION SUSPECTED) - Abnormal; Notable for the following components:   Glucose, UA >=500 (*)    Leukocytes,Ua MODERATE (*)    All other components within normal limits  COMPREHENSIVE METABOLIC PANEL - Abnormal; Notable for the following components:   BUN 25 (*)    Creatinine, Ser 1.30 (*)    Total Protein 6.1 (*)    Albumin 3.0 (*)    GFR, Estimated 43 (*)    All other components within normal limits  CBC - Abnormal; Notable for the following components:   WBC 14.8 (*)    RBC 3.24 (*)    Hemoglobin 11.4 (*)    HCT 35.0 (*)    MCV 108.0 (*)    MCH 35.2  (*)    RDW 16.2 (*)    Platelets 144 (*)    All other components within normal limits  TROPONIN I (HIGH SENSITIVITY) - Abnormal; Notable for the following components:   Troponin I (High Sensitivity) 47 (*)    All other components within normal limits  TROPONIN I (HIGH SENSITIVITY) - Abnormal; Notable for the following components:   Troponin I (High Sensitivity) 50 (*)    All other components within normal limits  RESP PANEL BY RT-PCR (RSV, FLU A&B, COVID)  RVPGX2  CULTURE, BLOOD (ROUTINE X 2)  CULTURE, BLOOD (ROUTINE X 2)  RESPIRATORY PANEL BY PCR  PROTIME-INR  APTT  HIV ANTIBODY (ROUTINE TESTING W REFLEX)  STREP PNEUMONIAE URINARY ANTIGEN  LEGIONELLA PNEUMOPHILA SEROGP 1 UR AG  PROCALCITONIN  I-STAT CG4 LACTIC ACID, ED  I-STAT CG4 LACTIC ACID, ED  I-STAT CG4 LACTIC ACID, ED    EKG EKG Interpretation Date/Time:  Thursday November 29 2022 23:22:20 EDT Ventricular Rate:  86 PR Interval:  144 QRS Duration:  90 QT Interval:  373 QTC Calculation: 447 R Axis:   48  Text Interpretation: Sinus rhythm Probable anteroseptal infarct, recent No significant change was found Confirmed by Glynn Octave [phone]) on 11/29/2022 11:29:50 PM  Radiology CT Renal Stone Study  Result Date: 11/30/2022 CLINICAL DATA:  Fever and weakness. Stones suspected. Sepsis. Flank pain. EXAM: CT ABDOMEN AND PELVIS WITHOUT CONTRAST TECHNIQUE: Multidetector CT imaging of the abdomen and pelvis was performed following the standard protocol without IV contrast. RADIATION DOSE REDUCTION: This exam was performed according to the departmental dose-optimization program which includes automated exposure control, adjustment of the mA and/or kV according to patient size and/or use of iterative reconstruction technique. COMPARISON:  CT abdomen and pelvis 04/25/2022 FINDINGS: Lower chest: Patchy opacities in the lower lungs. Small right pleural effusion. Hepatobiliary: Cholecystectomy.  No acute abnormality. Pancreas: Fatty  atrophy.  No acute abnormality. Spleen: Unremarkable. Adrenals/Urinary Tract: Unremarkable adrenal glands. No urinary calculi or hydronephrosis. Bladder is within normal limits. Stomach/Bowel: Normal caliber large and small bowel. No bowel wall thickening. Stomach is within normal limits. Vascular/Lymphatic: Aortic atherosclerosis. No enlarged abdominal or pelvic lymph nodes. Reproductive: Hysterectomy. Other: No free intraperitoneal fluid or air. Musculoskeletal: No acute fracture. IMPRESSION: 1. No acute abnormality in the abdomen or pelvis. 2. Patchy opacities in the lower lungs with small right pleural effusion. Findings are concerning for pneumonia. Aortic Atherosclerosis (ICD10-I70.0). Electronically Signed   By: Minerva Fester M.D.   On: 11/30/2022 01:03   CT Head Wo Contrast  Result Date: 11/30/2022 CLINICAL DATA:  Mental status change, unknown cause EXAM: CT HEAD WITHOUT CONTRAST TECHNIQUE: Contiguous axial images were obtained from the base of the skull through the vertex without intravenous contrast. RADIATION DOSE REDUCTION: This exam was performed according to the departmental dose-optimization program which includes automated exposure control, adjustment of the mA and/or kV according to patient size and/or use of iterative reconstruction technique. COMPARISON:  MRI head 12/17/2012 FINDINGS: Brain: Cerebral ventricle sizes are concordant with the degree of cerebral volume loss. Patchy and confluent areas of decreased attenuation are noted throughout the deep and periventricular white matter of the cerebral hemispheres bilaterally, compatible with chronic microvascular ischemic disease. No evidence of large-territorial acute infarction. No parenchymal hemorrhage. No mass lesion. No extra-axial collection. No mass effect or midline shift. No hydrocephalus. Basilar cisterns are patent. Vascular: No hyperdense vessel. Skull: No acute fracture or focal lesion. Sinuses/Orbits: Left sphenoid sinus  mucosal thickening. Otherwise paranasal sinuses and mastoid air cells are clear. Bilateral lens replacement. Otherwise the orbits are unremarkable. Other: None. IMPRESSION: No acute intracranial abnormality. Electronically Signed   By: Tish Frederickson M.D.   On: 11/30/2022 01:01   DG Chest Port 1 View  Result Date: 11/29/2022 CLINICAL DATA:  Questionable sepsis - evaluate for abnormality Cough. EXAM: PORTABLE CHEST 1 VIEW COMPARISON:  Radiograph 09/11/2022 FINDINGS: Lung volumes are low. 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 Chronic elevation of right hemidiaphragm. The heart is upper normal in size, accentuated by technique. No focal airspace disease, pleural effusion or pneumothorax. Surgical clips in the left chest wall. Reverse right shoulder arthroplasty. IMPRESSION: 1. Low lung volumes. No acute chest findings. 2. Chronic elevation of right hemidiaphragm. Electronically Signed   By: Narda Rutherford M.D.   On: 11/29/2022 23:52    Procedures .Critical Care  Performed by: Glynn Octave, MD Authorized by: Glynn Octave, MD   Critical care provider statement:    Critical care time (minutes):  45   Critical care time was exclusive of:  Separately billable procedures and treating other patients   Critical care was necessary to treat or prevent imminent or life-threatening deterioration of the following conditions:  Sepsis   Critical care was time spent personally by me on the following activities:  Development of treatment plan with patient or surrogate,  discussions with consultants, evaluation of patient's response to treatment, examination of patient, ordering and review of laboratory studies, ordering and review of radiographic studies, ordering and performing treatments and interventions, pulse oximetry, re-evaluation of patient's condition, review of old charts, blood draw for specimens and obtaining history from patient or surrogate   I assumed direction of critical care for this  patient from another provider in my specialty: no     Care discussed with: admitting provider       Medications Ordered in ED Medications  lactated ringers infusion ( Intravenous New Bag/Given 11/30/22 0033)  cefTRIAXone (ROCEPHIN) 2 g in sodium chloride 0.9 % 100 mL IVPB (0 g Intravenous Stopped 11/29/22 2358)  predniSONE (DELTASONE) tablet 7 mg (has no administration in time range)  rivaroxaban (XARELTO) tablet 20 mg (has no administration in time range)  azithromycin (ZITHROMAX) 500 mg in sodium chloride 0.9 % 250 mL IVPB (has no administration in time range)  acetaminophen (TYLENOL) tablet 650 mg (650 mg Oral Given 11/30/22 0619)  acetaminophen (TYLENOL) tablet 1,000 mg (1,000 mg Oral Given 11/29/22 2252)  lactated ringers bolus 1,000 mL (0 mLs Intravenous Stopped 11/30/22 0026)  lactated ringers bolus 1,000 mL (0 mLs Intravenous Stopped 11/30/22 0032)  azithromycin (ZITHROMAX) 500 mg in sodium chloride 0.9 % 250 mL IVPB (0 mg Intravenous Stopped 11/30/22 0309)    ED Course/ Medical Decision Making/ A&P                                 Medical Decision Making Amount and/or Complexity of Data Reviewed Independent Historian: EMS Labs: ordered. Decision-making details documented in ED Course. Radiology: ordered and independent interpretation performed. Decision-making details documented in ED Course. ECG/medicine tests: ordered and independent interpretation performed. Decision-making details documented in ED Course.  Risk OTC drugs. Prescription drug management. Decision regarding hospitalization.   Altered mental status for the past 2 to 3 days.  Febrile on arrival.  Code sepsis activated.  Patient started on IV fluids and broad-spectrum antibiotics after cultures were obtained.  Patient does have a history of reduced ejection fraction of 30 to 35%.  Denies chest pain or shortness of breath.  She is febrile.  Lactate is normal.  Blood cultures obtained.  Given broad-spectrum  antibiotics with concern for possible underlying sepsis of unclear etiology. Leukocytosis is noted.  Lactate is normal.  No increased work of breathing or hypoxia.  Patient given Rocephin and Zithromax with concern for pneumonia.  urinalysis is negative.  CT scan negative for kidney stone but does show some basilar pneumonia which likely explains her fever.  CT head is negative.  Remains confused but no respiratory distress.  Will need admission for sepsis likely secondary to pneumonia. Discussed with Dr. Julian Reil.          Final Clinical Impression(s) / ED Diagnoses Final diagnoses:  Altered mental status, unspecified altered mental status type  Community acquired pneumonia of right lung, unspecified part of lung    Rx / DC Orders ED Discharge Orders     None         Demetra Moya, Jeannett Senior, MD 11/30/22 [phone]

## 2022-11-29 NOTE — ED Triage Notes (Signed)
Pt arrived from home BIB [facility] for fever and weakness for several days, pt reports cough, nasal symptoms, and bilateral ear pain, pt has cotton in both ears. EMS reports pt was found laying in her urine. Pt alert, A&O x4, EMS reports VSS. Temp 10236 on arrival, pt has not attempted any OTC Motrin or Tylenol for fever. Pt reports chronic back pain, 8/10 at current.

## 2022-11-29 NOTE — Sepsis Progress Note (Signed)
Elink monitoring for the code sepsis protocol.  

## 2022-11-30 ENCOUNTER — Encounter: Payer: Medicare PPO | Admitting: Cardiovascular Disease

## 2022-11-30 ENCOUNTER — Other Ambulatory Visit: Payer: Self-pay

## 2022-11-30 DIAGNOSIS — E7800 Pure hypercholesterolemia, unspecified: Secondary | ICD-10-CM | POA: Diagnosis present

## 2022-11-30 DIAGNOSIS — G9340 Encephalopathy, unspecified: Secondary | ICD-10-CM | POA: Diagnosis not present

## 2022-11-30 DIAGNOSIS — N1832 Chronic kidney disease, stage 3b: Secondary | ICD-10-CM | POA: Diagnosis present

## 2022-11-30 DIAGNOSIS — Z8249 Family history of ischemic heart disease and other diseases of the circulatory system: Secondary | ICD-10-CM | POA: Diagnosis not present

## 2022-11-30 DIAGNOSIS — K219 Gastro-esophageal reflux disease without esophagitis: Secondary | ICD-10-CM | POA: Diagnosis present

## 2022-11-30 DIAGNOSIS — R4182 Altered mental status, unspecified: Secondary | ICD-10-CM | POA: Diagnosis not present

## 2022-11-30 DIAGNOSIS — E1169 Type 2 diabetes mellitus with other specified complication: Secondary | ICD-10-CM | POA: Diagnosis not present

## 2022-11-30 DIAGNOSIS — R6520 Severe sepsis without septic shock: Secondary | ICD-10-CM

## 2022-11-30 DIAGNOSIS — A419 Sepsis, unspecified organism: Secondary | ICD-10-CM | POA: Diagnosis present

## 2022-11-30 DIAGNOSIS — M0600 Rheumatoid arthritis without rheumatoid factor, unspecified site: Secondary | ICD-10-CM

## 2022-11-30 DIAGNOSIS — Z7902 Long term (current) use of antithrombotics/antiplatelets: Secondary | ICD-10-CM | POA: Diagnosis not present

## 2022-11-30 DIAGNOSIS — Z955 Presence of coronary angioplasty implant and graft: Secondary | ICD-10-CM | POA: Diagnosis not present

## 2022-11-30 DIAGNOSIS — D801 Nonfamilial hypogammaglobulinemia: Secondary | ICD-10-CM | POA: Diagnosis present

## 2022-11-30 DIAGNOSIS — I2510 Atherosclerotic heart disease of native coronary artery without angina pectoris: Secondary | ICD-10-CM | POA: Diagnosis present

## 2022-11-30 DIAGNOSIS — Z794 Long term (current) use of insulin: Secondary | ICD-10-CM | POA: Diagnosis not present

## 2022-11-30 DIAGNOSIS — R1310 Dysphagia, unspecified: Secondary | ICD-10-CM | POA: Diagnosis present

## 2022-11-30 DIAGNOSIS — I5022 Chronic systolic (congestive) heart failure: Secondary | ICD-10-CM | POA: Diagnosis present

## 2022-11-30 DIAGNOSIS — Z86718 Personal history of other venous thrombosis and embolism: Secondary | ICD-10-CM | POA: Diagnosis not present

## 2022-11-30 DIAGNOSIS — E1100 Type 2 diabetes mellitus with hyperosmolarity without nonketotic hyperglycemic-hyperosmolar coma (NKHHC): Secondary | ICD-10-CM

## 2022-11-30 DIAGNOSIS — J189 Pneumonia, unspecified organism: Secondary | ICD-10-CM | POA: Diagnosis present

## 2022-11-30 DIAGNOSIS — G9341 Metabolic encephalopathy: Secondary | ICD-10-CM

## 2022-11-30 DIAGNOSIS — Z66 Do not resuscitate: Secondary | ICD-10-CM | POA: Diagnosis present

## 2022-11-30 DIAGNOSIS — J470 Bronchiectasis with acute lower respiratory infection: Secondary | ICD-10-CM | POA: Diagnosis present

## 2022-11-30 DIAGNOSIS — M069 Rheumatoid arthritis, unspecified: Secondary | ICD-10-CM | POA: Diagnosis present

## 2022-11-30 DIAGNOSIS — E039 Hypothyroidism, unspecified: Secondary | ICD-10-CM | POA: Diagnosis present

## 2022-11-30 DIAGNOSIS — E1122 Type 2 diabetes mellitus with diabetic chronic kidney disease: Secondary | ICD-10-CM | POA: Diagnosis present

## 2022-11-30 DIAGNOSIS — Z7984 Long term (current) use of oral hypoglycemic drugs: Secondary | ICD-10-CM | POA: Diagnosis not present

## 2022-11-30 DIAGNOSIS — Z1152 Encounter for screening for COVID-19: Secondary | ICD-10-CM | POA: Diagnosis not present

## 2022-11-30 DIAGNOSIS — Z7901 Long term (current) use of anticoagulants: Secondary | ICD-10-CM | POA: Diagnosis not present

## 2022-11-30 DIAGNOSIS — M797 Fibromyalgia: Secondary | ICD-10-CM | POA: Diagnosis present

## 2022-11-30 LAB — CBC
HCT: 35 % — ABNORMAL LOW (ref 36.0–46.0)
Hemoglobin: 11.4 g/dL — ABNORMAL LOW (ref 12.0–15.0)
MCH: 35.2 pg — ABNORMAL HIGH (ref 26.0–34.0)
MCHC: 32.6 g/dL (ref 30.0–36.0)
MCV: 108 fL — ABNORMAL HIGH (ref 80.0–100.0)
Platelets: 144 10*3/uL — ABNORMAL LOW (ref 150–400)
RBC: 3.24 MIL/uL — ABNORMAL LOW (ref 3.87–5.11)
RDW: 16.2 % — ABNORMAL HIGH (ref 11.5–15.5)
WBC: 14.8 10*3/uL — ABNORMAL HIGH (ref 4.0–10.5)
nRBC: 0 % (ref 0.0–0.2)

## 2022-11-30 LAB — COMPREHENSIVE METABOLIC PANEL
ALT: 39 U/L (ref 0–44)
AST: 41 U/L (ref 15–41)
Albumin: 3 g/dL — ABNORMAL LOW (ref 3.5–5.0)
Alkaline Phosphatase: 48 U/L (ref 38–126)
Anion gap: 13 (ref 5–15)
BUN: 25 mg/dL — ABNORMAL HIGH (ref 8–23)
CO2: 22 mmol/L (ref 22–32)
Calcium: 9 mg/dL (ref 8.9–10.3)
Chloride: 106 mmol/L (ref 98–111)
Creatinine, Ser: 1.3 mg/dL — ABNORMAL HIGH (ref 0.44–1.00)
GFR, Estimated: 43 mL/min — ABNORMAL LOW (ref >60)
Glucose, Bld: 78 mg/dL (ref 70–99)
Potassium: 3.8 mmol/L (ref 3.5–5.1)
Sodium: 141 mmol/L (ref 135–145)
Total Bilirubin: 0.7 mg/dL (ref 0.3–1.2)
Total Protein: 6.1 g/dL — ABNORMAL LOW (ref 6.5–8.1)

## 2022-11-30 LAB — RESPIRATORY PANEL BY PCR

## 2022-11-30 LAB — TROPONIN I (HIGH SENSITIVITY)
Troponin I (High Sensitivity): 47 ng/L — ABNORMAL HIGH (ref <18)
Troponin I (High Sensitivity): 50 ng/L — ABNORMAL HIGH (ref <18)

## 2022-11-30 LAB — GLUCOSE, CAPILLARY
Glucose-Capillary: 98 mg/dL (ref 70–99)
Glucose-Capillary: 91 mg/dL (ref 70–99)

## 2022-11-30 LAB — URINALYSIS, W/ REFLEX TO CULTURE (INFECTION SUSPECTED)
Bacteria, UA: NONE SEEN
Bilirubin Urine: NEGATIVE
Glucose, UA: >=500 mg/dL — AB
Hgb urine dipstick: NEGATIVE
Ketones, ur: NEGATIVE mg/dL
Nitrite: NEGATIVE
Protein, ur: NEGATIVE mg/dL
Specific Gravity, Urine: 1.017 (ref 1.005–1.030)
pH: 5 (ref 5.0–8.0)

## 2022-11-30 LAB — RESP PANEL BY RT-PCR (RSV, FLU A&B, COVID)  RVPGX2
Influenza A by PCR: NEGATIVE
Influenza B by PCR: NEGATIVE
Resp Syncytial Virus by PCR: NEGATIVE
SARS Coronavirus 2 by RT PCR: NEGATIVE

## 2022-11-30 LAB — HEMOGLOBIN A1C
Hgb A1c MFr Bld: 6 % — ABNORMAL HIGH (ref 4.8–5.6)
Mean Plasma Glucose: 125.5 mg/dL

## 2022-11-30 LAB — CBG MONITORING, ED: Glucose-Capillary: 103 mg/dL — ABNORMAL HIGH (ref 70–99)

## 2022-11-30 LAB — STREP PNEUMONIAE URINARY ANTIGEN: Strep Pneumo Urinary Antigen: NEGATIVE

## 2022-11-30 LAB — PROCALCITONIN: Procalcitonin: 0.19 ng/mL

## 2022-11-30 LAB — I-STAT CG4 LACTIC ACID, ED: Lactic Acid, Venous: 1.8 mmol/L (ref 0.5–1.9)

## 2022-11-30 LAB — HIV ANTIBODY (ROUTINE TESTING W REFLEX): HIV Screen 4th Generation wRfx: NONREACTIVE

## 2022-11-30 MED ORDER — SODIUM CHLORIDE 0.9 % IV SOLN
500.0000 mg | Freq: Every day | INTRAVENOUS | Status: AC
  Administered 2022-11-30 – 2022-12-03 (×4): 500 mg via INTRAVENOUS
  Filled 2022-11-30 (×4): qty 5

## 2022-11-30 MED ORDER — ATORVASTATIN CALCIUM 40 MG PO TABS
80.0000 mg | ORAL_TABLET | Freq: Every day | ORAL | Status: DC
  Administered 2022-11-30 – 2022-12-06 (×7): 80 mg via ORAL
  Filled 2022-11-30 (×7): qty 2

## 2022-11-30 MED ORDER — BENZONATATE 100 MG PO CAPS
200.0000 mg | ORAL_CAPSULE | Freq: Two times a day (BID) | ORAL | Status: DC | PRN
  Administered 2022-11-30 – 2022-12-05 (×5): 200 mg via ORAL
  Filled 2022-11-30 (×6): qty 2

## 2022-11-30 MED ORDER — BUDESONIDE 0.5 MG/2ML IN SUSP
0.5000 mg | Freq: Two times a day (BID) | RESPIRATORY_TRACT | Status: DC
  Administered 2022-11-30 – 2022-12-06 (×12): 0.5 mg via RESPIRATORY_TRACT
  Filled 2022-11-30 (×12): qty 2

## 2022-11-30 MED ORDER — TRAMADOL HCL 50 MG PO TABS
50.0000 mg | ORAL_TABLET | Freq: Every day | ORAL | Status: DC
  Administered 2022-11-30 – 2022-12-05 (×6): 50 mg via ORAL
  Filled 2022-11-30 (×7): qty 1

## 2022-11-30 MED ORDER — TRAZODONE HCL 100 MG PO TABS
50.0000 mg | ORAL_TABLET | Freq: Every day | ORAL | Status: DC
  Administered 2022-11-30 – 2022-12-05 (×6): 50 mg via ORAL
  Filled 2022-11-30 (×6): qty 1

## 2022-11-30 MED ORDER — INSULIN ASPART 100 UNIT/ML IJ SOLN
0.0000 [IU] | Freq: Three times a day (TID) | INTRAMUSCULAR | Status: DC
  Administered 2022-12-02 – 2022-12-06 (×5): 1 [IU] via SUBCUTANEOUS
  Filled 2022-11-30: qty 0.06

## 2022-11-30 MED ORDER — PREDNISONE 5 MG PO TABS
7.0000 mg | ORAL_TABLET | Freq: Every day | ORAL | Status: DC
  Administered 2022-11-30 – 2022-12-06 (×7): 7 mg via ORAL
  Filled 2022-11-30 (×7): qty 2

## 2022-11-30 MED ORDER — EMPAGLIFLOZIN 10 MG PO TABS
10.0000 mg | ORAL_TABLET | Freq: Every day | ORAL | Status: DC
  Administered 2022-12-01 – 2022-12-06 (×6): 10 mg via ORAL
  Filled 2022-11-30 (×6): qty 1

## 2022-11-30 MED ORDER — HYDRALAZINE HCL 20 MG/ML IJ SOLN: 5.0000 mg | INTRAMUSCULAR | Status: DC | PRN

## 2022-11-30 MED ORDER — ORAL CARE MOUTH RINSE: 15.0000 mL | OROMUCOSAL | Status: DC | PRN

## 2022-11-30 MED ORDER — ALLOPURINOL 100 MG PO TABS
100.0000 mg | ORAL_TABLET | Freq: Every day | ORAL | Status: DC
  Administered 2022-11-30 – 2022-12-06 (×7): 100 mg via ORAL
  Filled 2022-11-30 (×7): qty 1

## 2022-11-30 MED ORDER — CLOPIDOGREL BISULFATE 75 MG PO TABS
75.0000 mg | ORAL_TABLET | Freq: Every day | ORAL | Status: DC
  Administered 2022-11-30 – 2022-12-06 (×7): 75 mg via ORAL
  Filled 2022-11-30 (×7): qty 1

## 2022-11-30 MED ORDER — IPRATROPIUM-ALBUTEROL 0.5-2.5 (3) MG/3ML IN SOLN
3.0000 mL | Freq: Four times a day (QID) | RESPIRATORY_TRACT | Status: DC
  Administered 2022-11-30 (×3): 3 mL via RESPIRATORY_TRACT
  Filled 2022-11-30 (×3): qty 3

## 2022-11-30 MED ORDER — HYDRALAZINE HCL 20 MG/ML IJ SOLN: 10.0000 mg | INTRAMUSCULAR | Status: DC | PRN

## 2022-11-30 MED ORDER — IPRATROPIUM-ALBUTEROL 0.5-2.5 (3) MG/3ML IN SOLN
3.0000 mL | Freq: Four times a day (QID) | RESPIRATORY_TRACT | Status: DC | PRN
  Administered 2022-12-04: 3 mL via RESPIRATORY_TRACT
  Filled 2022-11-30: qty 3

## 2022-11-30 MED ORDER — RIVAROXABAN 20 MG PO TABS
20.0000 mg | ORAL_TABLET | Freq: Every day | ORAL | Status: DC
  Administered 2022-11-30 – 2022-12-05 (×6): 20 mg via ORAL
  Filled 2022-11-30 (×6): qty 1

## 2022-11-30 MED ORDER — INSULIN ASPART 100 UNIT/ML IJ SOLN
0.0000 [IU] | Freq: Every day | INTRAMUSCULAR | Status: DC
  Filled 2022-11-30: qty 0.05

## 2022-11-30 MED ORDER — IPRATROPIUM-ALBUTEROL 0.5-2.5 (3) MG/3ML IN SOLN
3.0000 mL | Freq: Two times a day (BID) | RESPIRATORY_TRACT | Status: DC
  Administered 2022-12-01 – 2022-12-06 (×11): 3 mL via RESPIRATORY_TRACT
  Filled 2022-11-30 (×11): qty 3

## 2022-11-30 MED ORDER — GABAPENTIN 300 MG PO CAPS
300.0000 mg | ORAL_CAPSULE | Freq: Every day | ORAL | Status: DC
  Administered 2022-11-30 – 2022-12-05 (×6): 300 mg via ORAL
  Filled 2022-11-30 (×6): qty 1

## 2022-11-30 MED ORDER — ACETAMINOPHEN 325 MG PO TABS
650.0000 mg | ORAL_TABLET | Freq: Four times a day (QID) | ORAL | Status: DC | PRN
  Administered 2022-11-30 – 2022-12-06 (×14): 650 mg via ORAL
  Filled 2022-11-30 (×14): qty 2

## 2022-11-30 MED ORDER — SODIUM CHLORIDE 0.9 % IV SOLN
500.0000 mg | Freq: Once | INTRAVENOUS | Status: AC
  Administered 2022-11-30: 500 mg via INTRAVENOUS
  Filled 2022-11-30: qty 5

## 2022-11-30 MED ORDER — ISOSORB DINITRATE-HYDRALAZINE 20-37.5 MG PO TABS
1.0000 | ORAL_TABLET | Freq: Two times a day (BID) | ORAL | Status: DC
  Administered 2022-11-30 – 2022-12-06 (×12): 1 via ORAL
  Filled 2022-11-30 (×12): qty 1

## 2022-11-30 NOTE — Assessment & Plan Note (Addendum)
Looks like pt on prednisone 7mg  daily based on prior med rec and July office note from rheumatologist. Sounds like they aren't really able to use DMARDs due to recurrent bacterial infections. Cont prednisone 7mg  daily for the moment With SBP 160, dont really see an indication to go to stress dose steroids at the moment.

## 2022-11-30 NOTE — Assessment & Plan Note (Signed)
Creat 1.3 today looks to be c/w his baseline.

## 2022-11-30 NOTE — ED Notes (Signed)
Pt gave verbal consent for this RN to speak with pt's daughter Fredericka Bottcher by phone to give update of pt's status.

## 2022-11-30 NOTE — Assessment & Plan Note (Addendum)
Pt with delirium secondary to PNA and fever. Per daughter, pt gets delirium like this when ever she gets even "a hint" of a fever. Id certianly say this qualifies as more than that: fevers running as high as 102.6 today in ED.

## 2022-11-30 NOTE — Sepsis Progress Note (Signed)
Elink monitoring for the code sepsis protocol.  

## 2022-11-30 NOTE — Assessment & Plan Note (Addendum)
Fever, cough, WBC, PNA findings on CT AP. This occurring in setting of known underlying immunodeficiency: Hypogammaglobulinemia. PNA pathway Rocephin + azithromycin COVID, FLU, RSV neg Check RVP Tylenol PRN fever

## 2022-11-30 NOTE — ED Notes (Signed)
ED TO INPATIENT HANDOFF REPORT  Name/Age/Gender [name] 72 y.o. female  Code Status    Code Status Orders  (From admission, onward)           Start     Ordered   11/30/22 0544  Do not attempt resuscitation (DNR) Pre-Arrest Interventions Desired  Continuous       Question Answer Comment  If pulseless and not breathing No CPR or chest compressions.   In Pre-Arrest Conditions (Patient Has Pulse and Is Breathing) May intubate, use advanced airway interventions and cardioversion/ACLS medications if appropriate or indicated. May transfer to ICU.   Consent: Discussion documented in EHR or advanced directives reviewed      11/30/22 0552           Code Status History     Date Active Date Inactive Code Status Order ID Comments User Context   08/31/2022 1430 09/04/2022 1557 DNR [id number]  Jonah Blue, MD ED   08/27/2022 1637 08/28/2022 0000 Full Code [id number]  Mealor, Roberts Gaudy, MD Inpatient   04/25/2022 1454 05/05/2022 2050 Full Code [id number]  Glade Lloyd, MD ED   02/04/2021 1347 02/07/2021 1822 Full Code [id number]  Bobette Mo, MD ED   01/11/2021 1324 01/11/2021 2248 Full Code [id number]  Marykay Lex, MD Inpatient   10/04/2020 2106 10/11/2020 2322 DNR [id number]  Eduard Clos, MD ED   09/22/2020 1646 09/30/2020 2108 DNR [id number]  Margie Ege A, DO ED   02/23/2020 1504 02/29/2020 2009 Full Code [id number]  Emeline General, MD ED   02/16/2020 1420 02/19/2020 1914 Full Code [id number]  Armida Sans, PA-C Inpatient   03/06/2018 1827 03/09/2018 1837 Full Code [id number]  Rhetta Mura, MD Inpatient   02/10/2018 1659 02/12/2018 1941 Full Code [id number]  Merlene Laughter, DO ED   01/15/2018 0828 01/23/2018 2110 Full Code [id number]  Charlton Amor, PA-C Inpatient   01/15/2018 0828 01/15/2018 0828 Full Code [id number]  Charlton Amor, PA-C Inpatient   01/14/2018 2027 01/15/2018 0827 Full Code [id number]  Bobette Mo, MD ED   12/13/2017 1706  12/25/2017 0103 Full Code [id number]  Bobette Mo, MD ED   04/15/2016 2127 04/17/2016 1943 Full Code [id number]  Briscoe Deutscher, MD ED   02/20/2012 1729 02/26/2012 1540 Full Code [id number]  Thomas Hoff, RN Inpatient       Home/SNF/Other Home  Chief Complaint CAP (community acquired pneumonia) [J18.9]  Level of Care/Admitting Diagnosis ED Disposition     ED Disposition  Admit   Condition  --   Comment  Hospital Area: [facility] [[id number]]  Level of Care: Med-Surg [16]  May admit patient to [facility] or [facility] if equivalent level of care is available:: No  Covid Evaluation: Asymptomatic - no recent exposure (last 10 days) testing not required  Diagnosis: CAP (community acquired pneumonia) [381829]  Admitting Physician: Hillary Bow [phone]  Attending Physician: Hillary Bow [phone]  Certification:: I certify this patient will need inpatient services for at least 2 midnights  Expected Medical Readiness: 12/03/2022          Medical History Past Medical History:  Diagnosis Date   Anemia    Angio-edema    Breast cancer (HCC) 1998   Left breast, in remission   Bronchiectasis (HCC)    CHF (congestive heart failure) (HCC)    Clostridium difficile colitis 01/14/2018   Coronary artery disease    Depression    "some; don't take anything for  it" (02/20/2012), pt denies dx as of 06/02/21   Diverticulosis    DVT (deep venous thrombosis) (HCC)    Fibromyalgia 11/2011   GAVE (gastric antral vascular ectasia)    GERD (gastroesophageal reflux disease)    Headache(784.0)    "related to allergies; more at different times during the year" (02/20/2012)   Hemorrhoids    Hiatal hernia    back and neck   History of blood transfusion 2023   1 unit, 5 units of Iron   Hx of adenomatous colonic polyps 04/12/2016   Hypercholesteremia    good cholesterol is high   Hypothyroidism    h/o Graves disease   IBS (irritable bowel syndrome)    Moderate  persistent asthma    -FeV1 72% 2011, -IgE 102 2011, CT sinus Neg 2011   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 Casimer Lanius   Sleep apnea    uses cpap nightly   Tracheobronchomalacia     Allergies Allergies  Allergen Reactions   Baclofen Anaphylaxis and Other (See Comments)    Altered mental status requiring a 3-day hospital stay- patient became unresponsive   "Put me in a coma for five days", Altered mental status requiring a 3-day hospital stay- patient became unresponsive   Dust Mite Extract Shortness Of Breath and Other (See Comments)    "sneezing" (02/20/2012) too   Molds & Smuts Shortness Of Breath   Morphine And Codeine Hives and Itching   Other Shortness Of Breath and Other (See Comments)    Grass and weeds "sneezing; filled sinuses" (02/20/2012)   Penicillins Rash and Other (See Comments)    "welts" (02/20/2012)  Tolerated Ancef 02/16/20   Rofecoxib Swelling and Other (See Comments)    Vioxx- feet swelling   Shellfish Allergy Anaphylaxis, Shortness Of Breath, Itching and Rash   Shrimp Extract Anaphylaxis and Other (See Comments)    ALL SHELLFISH   Tetracycline Hcl Nausea And Vomiting   Tetracycline Hcl Other (See Comments)    GI Intolerance   Xolair [Omalizumab] Other (See Comments)    Caused Blood clot   Zoledronic Acid Other (See Comments)    Reclast- Fever, Put in hospital, dr said it was a reaction from a reaction    Fever, inability to walk, Put in hospital  Fever, Put in hospital, dr said it was a reaction from a reaction , Other reaction(s): Unknown   Dilaudid [Hydromorphone Hcl] Itching   Hydrocodone-Acetaminophen Itching   Levofloxacin Other (See Comments)    GI upset   Oxycodone Hcl Nausea And Vomiting    GI Intolerance   Paroxetine Nausea And Vomiting and Other (See Comments)    GI  Intolerance also   Celecoxib Swelling and Other (See Comments)    Feet swelling   Diltiazem Hcl Swelling   Lactose Other (See Comments)    Bloating and gas   Oxycodone-Acetaminophen Itching   Rituximab Other (See Comments)    Anemia    Tree Extract Other (See Comments)    "tested and told I was allergic to it; never experienced a reaction to it" (02/20/2012)   Gamunex [Immune Globulin] Itching and Rash    Tolerates hizentra    Penicillin G Procaine Rash and Other (See Comments)    Welts, also    IV Location/Drains/Wounds Patient Lines/Drains/Airways Status     Active  Line/Drains/Airways     Name Placement date Placement time Site Days   Peripheral IV 11/29/22 20 G Anterior;Proximal;Right Forearm 11/29/22  2254  Forearm  1   Peripheral IV 11/29/22 20 G Left;Posterior Hand 11/29/22  2259  Hand  1   Wound / Incision (Open or Dehisced) 02/26/20 (MASD) Moisture Associated Skin Damage Buttocks Bilateral Redness, excoration 02/26/20  2030  Buttocks  1008   Wound / Incision (Open or Dehisced) 09/22/20 Other (Comment) Toe (Comment  which one) Anterior;Right Broken toe s/p surgery 09/22/20  2350  Toe (Comment  which one)  799            Labs/Imaging Results for orders placed or performed during the hospital encounter of 11/29/22 (from the past 48 hour(s))  Urinalysis, w/ Reflex to Culture (Infection Suspected) -Urine, Clean Catch     Status: Abnormal   Collection Time: 11/29/22 10:49 PM  Result Value Ref Range   Specimen Source URINE, CATHETERIZED    Color, Urine YELLOW YELLOW   APPearance CLEAR CLEAR   Specific Gravity, Urine 1.017 1.005 - 1.030   pH 5.0 5.0 - 8.0   Glucose, UA >=500 (A) NEGATIVE mg/dL   Hgb urine dipstick NEGATIVE NEGATIVE   Bilirubin Urine NEGATIVE NEGATIVE   Ketones, ur NEGATIVE NEGATIVE mg/dL   Protein, ur NEGATIVE NEGATIVE mg/dL   Nitrite NEGATIVE NEGATIVE   Leukocytes,Ua MODERATE (A) NEGATIVE   RBC / HPF 0-5 0 - 5 RBC/hpf   WBC, UA 0-5 0 - 5 WBC/hpf     Comment:        Reflex urine culture not performed if WBC <=10, OR if Squamous epithelial cells >5. If Squamous epithelial cells >5 suggest recollection.    Bacteria, UA NONE SEEN NONE SEEN   Squamous Epithelial / HPF 0-5 0 - 5 /HPF    Comment: Performed at [facility], [address], [place], [place] [address]  Resp panel by RT-PCR (RSV, Flu A&B, Covid) Anterior Nasal Swab     Status: None   Collection Time: 11/29/22 10:51 PM   Specimen: Anterior Nasal Swab  Result Value Ref Range   SARS Coronavirus 2 by RT PCR NEGATIVE NEGATIVE    Comment: (NOTE) SARS-CoV-2 target nucleic acids are NOT DETECTED.  The SARS-CoV-2 RNA is generally detectable in upper respiratory specimens during the acute phase of infection. The lowest concentration of SARS-CoV-2 viral copies this assay can detect is 138 copies/mL. A negative result does not preclude SARS-Cov-2 infection and should not be used as the sole basis for treatment or other patient management decisions. A negative result may occur with  improper specimen collection/handling, submission of specimen other than nasopharyngeal swab, presence of viral mutation(s) within the areas targeted by this assay, and inadequate number of viral copies(<138 copies/mL). A negative result must be combined with clinical observations, patient history, and epidemiological information. The expected result is Negative.  Fact Sheet for Patients:  BloggerCourse.com  Fact Sheet for Healthcare Providers:  SeriousBroker.it  This test is no t yet approved or cleared by the [place] FDA and  has been authorized for detection and/or diagnosis of SARS-CoV-2 by FDA under an Emergency Use Authorization (EUA). This EUA will remain  in effect (meaning this test can be used) for the duration of the COVID-19 declaration under Section 564(b)(1) of the Act, 21 U.S.C.section 360bbb-3(b)(1), unless the  authorization is terminated  or revoked sooner.       Influenza A by PCR NEGATIVE NEGATIVE   Influenza B by  PCR NEGATIVE NEGATIVE    Comment: (NOTE) The Xpert Xpress SARS-CoV-2/FLU/RSV plus assay is intended as an aid in the diagnosis of influenza from Nasopharyngeal swab specimens and should not be used as a sole basis for treatment. Nasal washings and aspirates are unacceptable for Xpert Xpress SARS-CoV-2/FLU/RSV testing.  Fact Sheet for Patients: BloggerCourse.com  Fact Sheet for Healthcare Providers: SeriousBroker.it  This test is not yet approved or cleared by the [place] FDA and has been authorized for detection and/or diagnosis of SARS-CoV-2 by FDA under an Emergency Use Authorization (EUA). This EUA will remain in effect (meaning this test can be used) for the duration of the COVID-19 declaration under Section 564(b)(1) of the Act, 21 U.S.C. section 360bbb-3(b)(1), unless the authorization is terminated or revoked.     Resp Syncytial Virus by PCR NEGATIVE NEGATIVE    Comment: (NOTE) Fact Sheet for Patients: BloggerCourse.com  Fact Sheet for Healthcare Providers: SeriousBroker.it  This test is not yet approved or cleared by the [place] FDA and has been authorized for detection and/or diagnosis of SARS-CoV-2 by FDA under an Emergency Use Authorization (EUA). This EUA will remain in effect (meaning this test can be used) for the duration of the COVID-19 declaration under Section 564(b)(1) of the Act, 21 U.S.C. section 360bbb-3(b)(1), unless the authorization is terminated or revoked.  Performed at [facility], [address] [place], [place] [address]   CBC with Differential     Status: Abnormal   Collection Time: 11/29/22 10:55 PM  Result Value Ref Range   WBC 14.7 (H) 4.0 - 10.5 K/uL   RBC 3.81 (L) 3.87 - 5.11 MIL/uL    Hemoglobin 13.3 12.0 - 15.0 g/dL   HCT 13.2 44.0 - 10.2 %   MCV 109.2 (H) 80.0 - 100.0 fL   MCH 34.9 (H) 26.0 - 34.0 pg   MCHC 32.0 30.0 - 36.0 g/dL   RDW 72.5 (H) 36.6 - 44.0 %   Platelets 188 150 - 400 K/uL   nRBC 0.0 0.0 - 0.2 %   Neutrophils Relative % 70 %   Neutro Abs 10.3 (H) 1.7 - 7.7 K/uL   Lymphocytes Relative 13 %   Lymphs Abs 1.9 0.7 - 4.0 K/uL   Monocytes Relative 14 %   Monocytes Absolute 2.0 (H) 0.1 - 1.0 K/uL   Eosinophils Relative 3 %   Eosinophils Absolute 0.4 0.0 - 0.5 K/uL   Basophils Relative 0 %   Basophils Absolute 0.1 0.0 - 0.1 K/uL   Immature Granulocytes 0 %   Abs Immature Granulocytes 0.06 0.00 - 0.07 K/uL    Comment: Performed at [facility], [address] [place], [place] [address]  Protime-INR     Status: None   Collection Time: 11/29/22 10:55 PM  Result Value Ref Range   Prothrombin Time 14.0 11.4 - 15.2 seconds   INR 1.1 0.8 - 1.2    Comment: (NOTE) INR goal varies based on device and disease states. Performed at [facility], [address] [place], [place] [address]   APTT     Status: None   Collection Time: 11/29/22 10:55 PM  Result Value Ref Range   aPTT 27 24 - 36 seconds    Comment: Performed at [facility], [address], [place], [place] [address]  Troponin I (High Sensitivity)     Status: Abnormal   Collection Time: 11/29/22 10:55 PM  Result Value Ref Range   Troponin I (High Sensitivity) 47 (  H) <18 ng/L    Comment: (NOTE) Elevated high sensitivity troponin I (hsTnI) values and significant  changes across serial measurements may suggest ACS but many other  chronic and acute conditions are known to elevate hsTnI results.  Refer to the "Links" section for chest pain algorithms and additional  guidance. Performed at [facility], [address], [place], [place] [address]   Respiratory (~20 pathogens) panel by PCR     Status: None   Collection Time:  11/29/22 10:55 PM   Specimen: Nasopharyngeal Swab; Respiratory  Result Value Ref Range   Adenovirus NOT DETECTED NOT DETECTED   Coronavirus 229E NOT DETECTED NOT DETECTED    Comment: (NOTE) The Coronavirus on the Respiratory Panel, DOES NOT test for the novel  Coronavirus (2019 nCoV)    Coronavirus HKU1 NOT DETECTED NOT DETECTED   Coronavirus NL63 NOT DETECTED NOT DETECTED   Coronavirus OC43 NOT DETECTED NOT DETECTED   Metapneumovirus NOT DETECTED NOT DETECTED   Rhinovirus / Enterovirus NOT DETECTED NOT DETECTED   Influenza A NOT DETECTED NOT DETECTED   Influenza B NOT DETECTED NOT DETECTED   Parainfluenza Virus 1 NOT DETECTED NOT DETECTED   Parainfluenza Virus 2 NOT DETECTED NOT DETECTED   Parainfluenza Virus 3 NOT DETECTED NOT DETECTED   Parainfluenza Virus 4 NOT DETECTED NOT DETECTED   Respiratory Syncytial Virus NOT DETECTED NOT DETECTED   Bordetella pertussis NOT DETECTED NOT DETECTED   Bordetella Parapertussis NOT DETECTED NOT DETECTED   Chlamydophila pneumoniae NOT DETECTED NOT DETECTED   Mycoplasma pneumoniae NOT DETECTED NOT DETECTED    Comment: Performed at [facility], [address] [place], [place] [address]  I-Stat Lactic Acid, ED     Status: None   Collection Time: 11/29/22 11:11 PM  Result Value Ref Range   Lactic Acid, Venous 1.3 0.5 - 1.9 mmol/L  Comprehensive metabolic panel     Status: Abnormal   Collection Time: 11/30/22 12:28 AM  Result Value Ref Range   Sodium 141 135 - 145 mmol/L   Potassium 3.8 3.5 - 5.1 mmol/L   Chloride 106 98 - 111 mmol/L   CO2 22 22 - 32 mmol/L   Glucose, Bld 78 70 - 99 mg/dL    Comment: Glucose reference range applies only to samples taken after fasting for at least 8 hours.   BUN 25 (H) 8 - 23 mg/dL   Creatinine, Ser 6.43 (H) 0.44 - 1.00 mg/dL   Calcium 9.0 8.9 - 32.9 mg/dL   Total Protein 6.1 (L) 6.5 - 8.1 g/dL   Albumin 3.0 (L) 3.5 - 5.0 g/dL   AST 41 15 - 41 U/L   ALT 39 0 - 44 U/L   Alkaline Phosphatase 48 38 -  126 U/L   Total Bilirubin 0.7 0.3 - 1.2 mg/dL   GFR, Estimated 43 (L) >60 mL/min    Comment: (NOTE) Calculated using the CKD-EPI Creatinine Equation (2021)    Anion gap 13 5 - 15    Comment: Performed at [facility], [address], [place], [place] [address]  Troponin I (High Sensitivity)     Status: Abnormal   Collection Time: 11/30/22 12:28 AM  Result Value Ref Range   Troponin I (High Sensitivity) 50 (H) <18 ng/L    Comment: (NOTE) Elevated high sensitivity troponin I (hsTnI) values and significant  changes across serial measurements may suggest ACS but many other  chronic and acute conditions are known to elevate hsTnI results.  Refer to  the "Links" section for chest pain algorithms and additional  guidance. Performed at [facility], [address], [place], [place] [address]   I-Stat Lactic Acid, ED     Status: None   Collection Time: 11/30/22 12:41 AM  Result Value Ref Range   Lactic Acid, Venous 1.8 0.5 - 1.9 mmol/L  HIV Antibody (routine testing w rflx)     Status: None   Collection Time: 11/30/22  6:14 AM  Result Value Ref Range   HIV Screen 4th Generation wRfx Non Reactive Non Reactive    Comment: Performed at [facility], [address] [place], [place] [address]  CBC     Status: Abnormal   Collection Time: 11/30/22  6:14 AM  Result Value Ref Range   WBC 14.8 (H) 4.0 - 10.5 K/uL   RBC 3.24 (L) 3.87 - 5.11 MIL/uL   Hemoglobin 11.4 (L) 12.0 - 15.0 g/dL   HCT 82.9 (L) 56.2 - 13.0 %   MCV 108.0 (H) 80.0 - 100.0 fL   MCH 35.2 (H) 26.0 - 34.0 pg   MCHC 32.6 30.0 - 36.0 g/dL   RDW 86.5 (H) 78.4 - 69.6 %   Platelets 144 (L) 150 - 400 K/uL   nRBC 0.0 0.0 - 0.2 %    Comment: Performed at [facility], [address], [place], [place] [address]  Procalcitonin     Status: None   Collection Time: 11/30/22  6:14 AM  Result Value Ref Range   Procalcitonin 0.19 ng/mL    Comment:        Interpretation: PCT  (Procalcitonin) <= 0.5 ng/mL: Systemic infection (sepsis) is not likely. Local bacterial infection is possible. (NOTE)       Sepsis PCT Algorithm           Lower Respiratory Tract                                      Infection PCT Algorithm    ----------------------------     ----------------------------         PCT < 0.25 ng/mL                PCT < 0.10 ng/mL          Strongly encourage             Strongly discourage   discontinuation of antibiotics    initiation of antibiotics    ----------------------------     -----------------------------       PCT 0.25 - 0.50 ng/mL            PCT 0.10 - 0.25 ng/mL               OR       >80% decrease in PCT            Discourage initiation of                                            antibiotics      Encourage discontinuation           of antibiotics    ----------------------------     -----------------------------         PCT >= 0.50 ng/mL  PCT 0.26 - 0.50 ng/mL               AND        <80% decrease in PCT             Encourage initiation of                                             antibiotics       Encourage continuation           of antibiotics    ----------------------------     -----------------------------        PCT >= 0.50 ng/mL                  PCT > 0.50 ng/mL               AND         increase in PCT                  Strongly encourage                                      initiation of antibiotics    Strongly encourage escalation           of antibiotics                                     -----------------------------                                           PCT <= 0.25 ng/mL                                                 OR                                        > 80% decrease in PCT                                      Discontinue / Do not initiate                                             antibiotics  Performed at [facility], [address], [place], [place] [address]   Strep  pneumoniae urinary antigen     Status: None   Collection Time: 11/30/22  6:15 AM  Result Value Ref Range   Strep Pneumo Urinary Antigen NEGATIVE NEGATIVE    Comment:        Infection due to S. pneumoniae cannot be absolutely ruled out since the antigen present may be below the detection limit of the test.  Performed at [facility], [address] [place], [place] [address]   CBG monitoring, ED     Status: Abnormal   Collection Time: 11/30/22 12:09 PM  Result Value Ref Range   Glucose-Capillary 103 (H) 70 - 99 mg/dL    Comment: Glucose reference range applies only to samples taken after fasting for at least 8 hours.   *Note: Due to a large number of results and/or encounters for the requested time period, some results have not been displayed. A complete set of results can be found in Results Review.   CT Renal Stone Study  Result Date: 11/30/2022 CLINICAL DATA:  Fever and weakness. Stones suspected. Sepsis. Flank pain. EXAM: CT ABDOMEN AND PELVIS WITHOUT CONTRAST TECHNIQUE: Multidetector CT imaging of the abdomen and pelvis was performed following the standard protocol without IV contrast. RADIATION DOSE REDUCTION: This exam was performed according to the departmental dose-optimization program which includes automated exposure control, adjustment of the mA and/or kV according to patient size and/or use of iterative reconstruction technique. COMPARISON:  CT abdomen and pelvis 04/25/2022 FINDINGS: Lower chest: Patchy opacities in the lower lungs. Small right pleural effusion. Hepatobiliary: Cholecystectomy.  No acute abnormality. Pancreas: Fatty atrophy.  No acute abnormality. Spleen: Unremarkable. Adrenals/Urinary Tract: Unremarkable adrenal glands. No urinary calculi or hydronephrosis. Bladder is within normal limits. Stomach/Bowel: Normal caliber large and small bowel. No bowel wall thickening. Stomach is within normal limits. Vascular/Lymphatic: Aortic atherosclerosis. No enlarged  abdominal or pelvic lymph nodes. Reproductive: Hysterectomy. Other: No free intraperitoneal fluid or air. Musculoskeletal: No acute fracture. IMPRESSION: 1. No acute abnormality in the abdomen or pelvis. 2. Patchy opacities in the lower lungs with small right pleural effusion. Findings are concerning for pneumonia. Aortic Atherosclerosis (ICD10-I70.0). Electronically Signed   By: Minerva Fester M.D.   On: 11/30/2022 01:03   CT Head Wo Contrast  Result Date: 11/30/2022 CLINICAL DATA:  Mental status change, unknown cause EXAM: CT HEAD WITHOUT CONTRAST TECHNIQUE: Contiguous axial images were obtained from the base of the skull through the vertex without intravenous contrast. RADIATION DOSE REDUCTION: This exam was performed according to the departmental dose-optimization program which includes automated exposure control, adjustment of the mA and/or kV according to patient size and/or use of iterative reconstruction technique. COMPARISON:  MRI head 12/17/2012 FINDINGS: Brain: Cerebral ventricle sizes are concordant with the degree of cerebral volume loss. Patchy and confluent areas of decreased attenuation are noted throughout the deep and periventricular white matter of the cerebral hemispheres bilaterally, compatible with chronic microvascular ischemic disease. No evidence of large-territorial acute infarction. No parenchymal hemorrhage. No mass lesion. No extra-axial collection. No mass effect or midline shift. No hydrocephalus. Basilar cisterns are patent. Vascular: No hyperdense vessel. Skull: No acute fracture or focal lesion. Sinuses/Orbits: Left sphenoid sinus mucosal thickening. Otherwise paranasal sinuses and mastoid air cells are clear. Bilateral lens replacement. Otherwise the orbits are unremarkable. Other: None. IMPRESSION: No acute intracranial abnormality. Electronically Signed   By: Tish Frederickson M.D.   On: 11/30/2022 01:01   DG Chest Port 1 View  Result Date: 11/29/2022 CLINICAL DATA:   Questionable sepsis - evaluate for abnormality Cough. EXAM: PORTABLE CHEST 1 VIEW COMPARISON:  Radiograph 09/11/2022 FINDINGS: Lung volumes are low. 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 Chronic elevation of right hemidiaphragm. The heart is upper normal in size, accentuated by technique. No focal airspace disease, pleural effusion or pneumothorax. Surgical clips in the left chest wall. Reverse right shoulder arthroplasty. IMPRESSION: 1. Low lung volumes. No acute chest findings.  2. Chronic elevation of right hemidiaphragm. Electronically Signed   By: Narda Rutherford M.D.   On: 11/29/2022 23:52    Pending Labs Unresulted Labs (From admission, onward)     Start     Ordered   12/01/22 0500  CBC  Daily,   R      11/30/22 0631   12/01/22 0500  Basic metabolic panel  Daily,   R      11/30/22 0631   11/30/22 1134  Hemoglobin A1c  Once,   R       Comments: To assess prior glycemic control    11/30/22 1133   11/30/22 0631  Legionella Pneumophila Serogp 1 Ur Ag  ONCE - URGENT,   URGENT        11/30/22 0631   11/29/22 2249  Culture, blood (Routine x 2)  BLOOD CULTURE X 2,   R (with STAT occurrences)      11/29/22 2250            Vitals/Pain Today's Vitals   11/30/22 0800 11/30/22 0851 11/30/22 1100 11/30/22 1126  BP: 125/76  (!) 158/68   Pulse: 78  81   Resp: 18  (!) 24   Temp:  99.7 F (37.6 C)  99.5 F (37.5 C)  TempSrc:  Oral  Oral  SpO2: 95%  96%   Weight:      Height:      PainSc:        Isolation Precautions Droplet precaution  Medications Medications  cefTRIAXone (ROCEPHIN) 2 g in sodium chloride 0.9 % 100 mL IVPB (0 g Intravenous Stopped 11/29/22 2358)  predniSONE (DELTASONE) tablet 7 mg (7 mg Oral Given 11/30/22 0905)  rivaroxaban (XARELTO) tablet 20 mg (has no administration in time range)  azithromycin (ZITHROMAX) 500 mg in sodium chloride 0.9 % 250 mL IVPB (has no administration in time range)  acetaminophen (TYLENOL) tablet 650 mg (650 mg Oral Given 11/30/22  1136)  ipratropium-albuterol (DUONEB) 0.5-2.5 (3) MG/3ML nebulizer solution 3 mL (has no administration in time range)  traMADol (ULTRAM) tablet 50 mg (has no administration in time range)  traZODone (DESYREL) tablet 50 mg (has no administration in time range)  gabapentin (NEURONTIN) capsule 300 mg (has no administration in time range)  allopurinol (ZYLOPRIM) tablet 100 mg (has no administration in time range)  benzonatate (TESSALON) capsule 200 mg (has no administration in time range)  clopidogrel (PLAVIX) tablet 75 mg (has no administration in time range)  empagliflozin (JARDIANCE) tablet 10 mg (has no administration in time range)  atorvastatin (LIPITOR) tablet 80 mg (has no administration in time range)  budesonide (PULMICORT) nebulizer solution 0.5 mg (0.5 mg Nebulization Not Given 11/30/22 0918)  isosorbide-hydrALAZINE (BIDIL) 20-37.5 MG per tablet 1 tablet (has no administration in time range)  ipratropium-albuterol (DUONEB) 0.5-2.5 (3) MG/3ML nebulizer solution 3 mL (3 mLs Nebulization Given 11/30/22 0907)  hydrALAZINE (APRESOLINE) injection 10 mg (has no administration in time range)  insulin aspart (novoLOG) injection 0-5 Units (has no administration in time range)  insulin aspart (novoLOG) injection 0-6 Units (has no administration in time range)  acetaminophen (TYLENOL) tablet 1,000 mg (1,000 mg Oral Given 11/29/22 2252)  lactated ringers bolus 1,000 mL (0 mLs Intravenous Stopped 11/30/22 0026)  lactated ringers bolus 1,000 mL (0 mLs Intravenous Stopped 11/30/22 0032)  azithromycin (ZITHROMAX) 500 mg in sodium chloride 0.9 % 250 mL IVPB (0 mg Intravenous Stopped 11/30/22 0309)    Mobility non-ambulatory

## 2022-11-30 NOTE — H&P (Signed)
History and Physical    Patient: Ebony Scott UXL:[id number] DOB: 13-May-1950 DOA: 11/29/2022 DOS: the patient was seen and examined on 11/30/2022 PCP: [name], MD  Patient coming from: Home  Chief Complaint:  Chief Complaint  Patient presents with   Weakness   HPI: Ebony Scott is a 72 y.o. female with medical history significant of asthma, hypothyroidism, RA, hypogammaglobulinemia.  Pt feeling ill for 2-3 days.  Pt with confusion and AMS, brought in by EMS.  Found down at home, oriented to person and place.  Pt with cough, no SOB, no CP, no dysuria.  Pt also denies fever, but has Tm of 102.6 on arrival to ED.  ED workup: Work up in the ED ultimately reveals PNA in lower lungs seen on CT scan done of abd/pelvis.  Review of Systems: As mentioned in the history of present illness. All other systems reviewed and are negative. Past Medical History:  Diagnosis Date   Anemia    Angio-edema    Breast cancer (HCC) 1998   Left breast, in remission   Bronchiectasis (HCC)    CHF (congestive heart failure) (HCC)    Clostridium difficile colitis 01/14/2018   Coronary artery disease    Depression    "some; don't take anything for it" (02/20/2012), pt denies dx as of 06/02/21   Diverticulosis    DVT (deep venous thrombosis) (HCC)    Fibromyalgia 11/2011   GAVE (gastric antral vascular ectasia)    GERD (gastroesophageal reflux disease)    Headache(784.0)    "related to allergies; more at different times during the year" (02/20/2012)   Hemorrhoids    Hiatal hernia    back and neck   History of blood transfusion 2023   1 unit, 5 units of Iron   Hx of adenomatous colonic polyps 04/12/2016   Hypercholesteremia    good cholesterol is high   Hypothyroidism    h/o Graves disease   IBS (irritable bowel syndrome)    Moderate persistent asthma    -FeV1 72% 2011, -IgE 102 2011, CT sinus Neg 2011   Osteomyelitis of second toe of right foot (HCC) 09/23/2020   Osteoporosis    on reclast  yearly   Septic olecranon bursitis of right elbow 09/22/2020   Seronegative rheumatoid arthritis (HCC)    Dr. Casimer Lanius   Sleep apnea    uses cpap nightly   Tracheobronchomalacia    Past Surgical History:  Procedure Laterality Date   ABDOMINAL HYSTERECTOMY N/A    Phreesia 10/21/2019   ANTERIOR AND POSTERIOR REPAIR  1990's   APPENDECTOMY     ARGON LASER APPLICATION  02/05/2021   Procedure: ARGON LASER APPLICATION;  Surgeon: Benancio Deeds, MD;  Location: [facility];  Service: Gastroenterology;;   BREAST LUMPECTOMY  1998   left   BREAST SURGERY N/A    Phreesia 10/21/2019   BRONCHIAL WASHINGS  04/05/2020   Procedure: BRONCHIAL WASHINGS;  Surgeon: Martina Sinner, MD;  Location: [facility];  Service: Pulmonary;;   CAPSULOTOMY Right 08/28/2021   Procedure: CAPSULOTOMY;  Surgeon: Candelaria Stagers, DPM;  Location: [facility];  Service: Podiatry;  Laterality: Right;   CARPOMETACARPEL (CMC) FUSION OF THUMB WITH AUTOGRAFT FROM RADIUS  ~ 2009   "both thumbs" (02/20/2012)   CATARACT EXTRACTION W/ INTRAOCULAR LENS  IMPLANT, BILATERAL  2012   CERVICAL DISCECTOMY  10/2001   C5-C6   CERVICAL FUSION  2003   C3-C4   CHOLECYSTECTOMY     COLONOSCOPY  CORONARY STENT INTERVENTION N/A 01/11/2021   Procedure: CORONARY STENT INTERVENTION;  Surgeon: Marykay Lex, MD;  Location: [facility] INVASIVE CV LAB;  Service: Cardiovascular;  Laterality: N/A;   DEBRIDEMENT TENNIS ELBOW  ?1970's   right   ESOPHAGOGASTRODUODENOSCOPY     ESOPHAGOGASTRODUODENOSCOPY (EGD) WITH PROPOFOL N/A 02/05/2021   Procedure: ESOPHAGOGASTRODUODENOSCOPY (EGD) WITH PROPOFOL;  Surgeon: Benancio Deeds, MD;  Location: [facility];  Service: Gastroenterology;  Laterality: N/A;   EYE SURGERY N/A    Phreesia 10/21/2019   HAMMER TOE SURGERY Right 08/28/2021   Procedure: HAMMER TOE REPAIR 2ND TOE RIGHT FOOT;  Surgeon: Candelaria Stagers, DPM;  Location: [facility];  Service: Podiatry;  Laterality: Right;   HYSTERECTOMY     I & D  EXTREMITY Right 09/22/2020   Procedure: IRRIGATION AND DEBRIDEMENT RIGHT HAND AND ELBOW;  Surgeon: Teryl Lucy, MD;  Location: [facility];  Service: Orthopedics;  Laterality: Right;   ICD IMPLANT N/A 08/27/2022   Procedure: ICD IMPLANT;  Surgeon: Maurice Small, MD;  Location: [facility];  Service: Cardiovascular;  Laterality: N/A;   KNEE ARTHROPLASTY  ?1990's   "?right; w/cartilage repair" (02/20/2012)   MASS EXCISION Left 08/28/2021   Procedure: EXCISION OF SOFT TISSUE  MASS LEFT FOOT;  Surgeon: Candelaria Stagers, DPM;  Location: [facility];  Service: Podiatry;  Laterality: Left;   NASAL SEPTUM SURGERY  1980's   POSTERIOR CERVICAL FUSION/FORAMINOTOMY  2004   "failed initial fusion; rewired  anterior neck" (02/20/2012)   REVERSE SHOULDER ARTHROPLASTY Right 02/16/2020   Procedure: REVERSE SHOULDER ARTHROPLASTY;  Surgeon: Teryl Lucy, MD;  Location: [facility];  Service: Orthopedics;  Laterality: Right;   RIGHT/LEFT HEART CATH AND CORONARY ANGIOGRAPHY N/A 01/11/2021   Procedure: RIGHT/LEFT HEART CATH AND CORONARY ANGIOGRAPHY;  Surgeon: Marykay Lex, MD;  Location: [facility] INVASIVE CV LAB;  Service: Cardiovascular;  Laterality: N/A;   SPINE SURGERY N/A    Phreesia 10/21/2019   TONSILLECTOMY  ~ 1953   VESICOVAGINAL FISTULA CLOSURE W/ TAH  1988   VIDEO BRONCHOSCOPY Bilateral 08/23/2016   Procedure: VIDEO BRONCHOSCOPY WITH FLUORO;  Surgeon: Roslynn Amble, MD;  Location: [facility];  Service: Cardiopulmonary;  Laterality: Bilateral;   VIDEO BRONCHOSCOPY N/A 04/05/2020   Procedure: VIDEO BRONCHOSCOPY WITHOUT FLUORO;  Surgeon: Martina Sinner, MD;  Location: [facility];  Service: Pulmonary;  Laterality: N/A;   Social History:  reports that she has never smoked. She has been exposed to tobacco smoke. She has never used smokeless tobacco. She reports that she does not currently use alcohol. She reports that she does not use drugs.  Allergies  Allergen Reactions   Baclofen Anaphylaxis and Other  (See Comments)    Altered mental status requiring a 3-day hospital stay- patient became unresponsive   "Put me in a coma for five days", Altered mental status requiring a 3-day hospital stay- patient became unresponsive   Dust Mite Extract Shortness Of Breath and Other (See Comments)    "sneezing" (02/20/2012) too   Molds & Smuts Shortness Of Breath   Morphine And Codeine Hives and Itching   Other Shortness Of Breath and Other (See Comments)    Grass and weeds "sneezing; filled sinuses" (02/20/2012)   Penicillins Rash and Other (See Comments)    "welts" (02/20/2012)  Tolerated Ancef 02/16/20   Rofecoxib Swelling and Other (See Comments)    Vioxx- feet swelling   Shellfish Allergy Anaphylaxis, Shortness Of Breath, Itching and Rash   Shrimp Extract Anaphylaxis and Other (See Comments)  ALL SHELLFISH   Tetracycline Hcl Nausea And Vomiting   Tetracycline Hcl Other (See Comments)    GI Intolerance   Xolair [Omalizumab] Other (See Comments)    Caused Blood clot   Zoledronic Acid Other (See Comments)    Reclast- Fever, Put in hospital, dr said it was a reaction from a reaction    Fever, inability to walk, Put in hospital  Fever, Put in hospital, dr said it was a reaction from a reaction , Other reaction(s): Unknown   Dilaudid [Hydromorphone Hcl] Itching   Hydrocodone-Acetaminophen Itching   Levofloxacin Other (See Comments)    GI upset  Other Reaction(s): GI Intolerance, Other (See Comments)  REACTION: GI upset, GI upset   Oxycodone Hcl Nausea And Vomiting   Oxycodone Hcl Other (See Comments)    GI Intolerance   Paroxetine Nausea And Vomiting and Other (See Comments)    GI Intolerance also   Celecoxib Swelling and Other (See Comments)    Feet swelling   Diltiazem Hcl Swelling   Lactose Other (See Comments)    Bloating and gas   Lactose Intolerance (Gi) Other (See Comments)    Bloating and gas   Oxycodone-Acetaminophen Itching   Rituximab Other (See Comments)    Anemia     Tree Extract Other (See Comments)    "tested and told I was allergic to it; never experienced a reaction to it" (02/20/2012)   Gamunex [Immune Globulin] Itching and Rash    Tolerates hizentra    Penicillin G Procaine Rash and Other (See Comments)    Welts, also    Family History  Problem Relation Age of Onset   Allergies Mother    Heart disease Mother    Arthritis Mother    Lung cancer Mother        heavy smoker   Diabetes Mother    Allergies Father    Heart disease Father    Arthritis Father    Stroke Father    Heart disease Brother    Diabetes Maternal Grandfather    Colitis Daughter    Colon cancer Other        Maternal half uncle    Prior to Admission medications   Medication Sig Start Date End Date Taking? Authorizing Provider  acetaminophen (TYLENOL) 500 MG tablet Take 500 mg by mouth as needed for moderate pain or fever.    [provider]  acetic acid-hydrocortisone (VOSOL-HC) OTIC solution Place 4 drops into both ears See admin instructions. Chloramp/sulfu/ampho/ampho/B/H drop 100-100-5 mg Instill 4 drops into both ears the first 5 days of the month, then as directed/as needed for itching    [provider]  albuterol (VENTOLIN HFA) 108 (90 Base) MCG/ACT inhaler Inhale 2 puffs into the lungs every 6 (six) hours as needed for wheezing or shortness of breath. 03/01/21   Kozlow, [name], MD  allopurinol (ZYLOPRIM) 100 MG tablet TAKE 1 TABLET BY MOUTH DAILY 11/01/22   [name], MD  Alpha-D-Galactosidase (BEANO PO) Take 2-3 tablets by mouth 3 (three) times daily as needed (Gas).    [provider]  Alpha-Lipoic Acid 600 MG TABS Take 600 mg by mouth daily.    [provider]  atorvastatin (LIPITOR) 80 MG tablet TAKE 1 TABLET BY MOUTH DAILY 11/01/22   [name], MD  azelastine (ASTELIN) 0.1 % nasal spray Place 2 sprays into both nostrils daily. Use in each nostril as directed Patient taking differently: Place 2 sprays into both  nostrils at bedtime as  needed for allergies or rhinitis. Use in each nostril as directed 06/13/21   Kozlow, [name], MD  benzonatate (TESSALON) 200 MG capsule TAKE 1 CAPSULE BY MOUTH THREE TIMES DAILY AS NEEDED FOR COUGH Patient taking differently: Take 200 mg by mouth 2 (two) times daily as needed for cough. 07/13/21   [name], MD  budesonide (PULMICORT) 0.5 MG/2ML nebulizer solution Take 2 mLs (0.5 mg total) by nebulization 2 (two) times daily. 07/20/22   Martina Sinner, MD  Calcium Citrate (CITRACAL PO) Take 1 tablet by mouth daily. With magnesium    [provider]  cetirizine (ZYRTEC) 10 MG tablet Take 1 tablet (10 mg total) by mouth 2 (two) times daily as needed for allergies (Can take a n extra dose during flare ups.). Patient taking differently: Take 10 mg by mouth at bedtime. 06/13/21   Kozlow, [name], MD  Cholecalciferol (VITAMIN D3) 25 MCG (1000 UT) capsule Take 1,000 Units by mouth at bedtime.    [provider]  clopidogrel (PLAVIX) 75 MG tablet Take 1 tablet (75 mg total) by mouth daily. 05/11/22   Jodelle Red, MD  cyproheptadine (PERIACTIN) 4 MG tablet Take 1 tablet (4 mg total) by mouth 3 (three) times daily as needed for allergies. Patient taking differently: Take 12 mg by mouth at bedtime. 08/10/22   Kozlow, [name], MD  denosumab (PROLIA) 60 MG/ML SOSY injection Inject 60 mg into the skin every 6 (six) months.    [provider]  dexlansoprazole (DEXILANT) 60 MG capsule Take 60 mg by mouth daily.    [provider]  empagliflozin (JARDIANCE) 10 MG TABS tablet Take 1 tablet (10 mg total) by mouth daily before breakfast. 04/11/22   [name], MD  EPINEPHRINE 0.3 mg/0.3 mL IJ SOAJ injection USE AS DIRECTED BY YOUR PHYSICIAN INTRAMUSCULARLY AS NEEDED FOR ANAPHYLAXIS 10/09/22   Kozlow, [name], MD  famotidine (PEPCID) 20 MG tablet Take 1 tablet (20 mg total) by mouth daily. TAKE 1 TABLET BY MOUTH EACH NIGHT AT BEDTIME Strength: 20  mg 08/07/22   Kozlow, [name], MD  folic acid (FOLVITE) 1 MG tablet Take 1 tablet (1 mg total) by mouth daily. 02/16/22   [name], MD  furosemide (LASIX) 40 MG tablet DAILY AS NEEDED FOR WEIGHT GAIN OF 2 POUNDS OVERNIGHT OR 5 POUNDS IN ONE WEEK 07/12/22   Alver Sorrow, NP  gabapentin (NEURONTIN) 300 MG capsule Take 1 capsule (300 mg total) by mouth at bedtime. Patient taking differently: Take 300 mg by mouth at bedtime. Can take up to three time a say as needed 05/04/22   Lewie Chamber, MD  GOODSENSE ARTHRITIS PAIN 1 % GEL Apply 1 Application topically 4 times daily as needed for pain. Patient taking differently: Diclofenac 05/10/22   [name], MD  guaiFENesin (MUCINEX) 600 MG 12 hr tablet Take 600 mg by mouth 2 (two) times daily.    [provider]  HIZENTRA 10 GM/50ML SOLN Inject 65 mLs into the skin once a week. 13 g Infusion 08/01/22   [provider]  ipratropium-albuterol (DUONEB) 0.5-2.5 (3) MG/3ML SOLN Take 3 mLs by nebulization every 6 (six) hours as needed. 11/14/21   Martina Sinner, MD  isosorbide-hydrALAZINE (BIDIL) 20-37.5 MG tablet Take 1 tablet by mouth 2 (two) times daily. 07/12/22   Alver Sorrow, NP  Lactase (LACTOSE FAST ACTING RELIEF PO) Take 3 tablets by mouth 3 (three) times daily before meals. For dairy food or drink  [provider]  lidocaine-prilocaine (EMLA) cream Apply 1 application  topically as needed (pain). 04/18/21   [provider]  magnesium oxide (MAG-OX) 400 (240 Mg) MG tablet Take 400 mg by mouth at bedtime.    [provider]  mirtazapine (REMERON SOL-TAB) 30 MG disintegrating tablet Take 1 tablet by mouth at bedtime as needed for sleep. 11/01/22   [name], MD  montelukast (SINGULAIR) 10 MG tablet Take 1 tablet (10 mg total) by mouth daily after supper. 07/30/22   [name], MD  mupirocin ointment (BACTROBAN) 2 % Apply 1 Application topically at bedtime.    [provider]  Nystatin (GERHARDT'S BUTT CREAM) CREA Apply topically 3 times a day as needed for itching or irritation 12/30/20   [name], MD  nystatin (MYCOSTATIN) 100000 UNIT/ML suspension Use as directed 15 mLs in the mouth or throat at bedtime as needed (if has yeast in mouth).    [provider]  nystatin-triamcinolone ointment (MYCOLOG) Apply 1 Application topically daily as needed (Vaginal irritation).    [provider]  predniSONE (DELTASONE) 1 MG tablet TAKE 2 TABLETS (2MG ) BY MOUTH ONCE DAILY WITH BREAKFAST. TAKE ALONG WITH 5MG  DAILY 11/01/22   Rice, [name], MD  predniSONE (DELTASONE) 5 MG tablet tAKE 1 TABLET (5MG ) BY MOUTH ONCE DAILY WITH BREAKFAST 11/01/22   Rice, [name], MD  Prenatal Vit-Fe Fumarate-FA (PRENATAL VITAMINS PO) Take 1 tablet by mouth daily.    [provider]  Probiotic Product (ALIGN) 4 MG CAPS Take 4 mg by mouth daily.    [provider]  Propylene Glycol (SYSTANE COMPLETE) 0.6 % SOLN Place 1 drop into both eyes 2 (two) times daily. 03/01/21   Kozlow, [name], MD  sacubitril-valsartan (ENTRESTO) 24-26 MG Take 1 tablet by mouth 2 (two) times daily. 08/13/22   Jodelle Red, MD  Tiotropium Bromide-Olodaterol (STIOLTO RESPIMAT) 2.5-2.5 MCG/ACT AERS Inhale 2 puffs into the lungs daily. Patient taking differently: Inhale 2 puffs into the lungs at bedtime. 07/20/22   Martina Sinner, MD  traMADol (ULTRAM) 50 MG tablet Take 1 tablet (50 mg total) by mouth at bedtime. 11/22/22   [name], MD  traZODone (DESYREL) 50 MG tablet Take 1 tablet (50 mg total) by mouth at bedtime. 11/22/22   [name], MD  Triamcinolone Acetonide (TRIAMCINOLONE 0.1 % CREAM : EUCERIN) CREA Apply 1 Application topically 2 (two) times daily. Patient taking differently: Apply 1 Application topically daily as needed for rash or irritation. 05/21/22   [name], MD  triamcinolone ointment (KENALOG) 0.5 % Apply 1  Application topically 2 (two) times daily. 06/14/22   Jaci Standard, MD  XARELTO 20 MG TABS tablet TAKE 1 TABLET BY MOUTH DAILY WITH SUPPER Patient taking differently: Take 20 mg by mouth daily with supper. 02/15/22   [name], MD    Physical Exam: Vitals:   11/30/22 0300 11/30/22 0335 11/30/22 0400 11/30/22 0520  BP: (!) 153/69  (!) 159/66 (!) 167/63  Pulse: 83  78 83  Resp: 17  19 19   Temp:  98.9 F (37.2 C)    TempSrc:  Oral    SpO2: 95%  94% 98%  Weight:      Height:       Constitutional: Mild confusion, ill appearing Respiratory: Very coarse sounding cough.  Cardiovascular: Regular rate and rhythm, no murmurs / rubs / gallops. No extremity edema. 2+ pedal pulses. No carotid bruits.  Abdomen: no  tenderness, no masses palpated. No hepatosplenomegaly. Bowel sounds positive.  Neurologic: CN 2-12 grossly intact. Sensation intact, DTR normal. Strength 5/5 in all 4.  Psychiatric: Mildly confused, running fever again.  Data Reviewed:    Labs on Admission: I have personally reviewed following labs and imaging studies  CBC: Recent Labs  Lab 11/29/22 2255  WBC 14.7*  NEUTROABS 10.3*  HGB 13.3  HCT 41.6  MCV 109.2*  PLT 188   Basic Metabolic Panel: Recent Labs  Lab 11/30/22 0028  NA 141  K 3.8  CL 106  CO2 22  GLUCOSE 78  BUN 25*  CREATININE 1.30*  CALCIUM 9.0   GFR: Estimated Creatinine Clearance: 30.5 mL/min (A) (by C-G formula based on SCr of 1.3 mg/dL (H)). Liver Function Tests: Recent Labs  Lab 11/30/22 0028  AST 41  ALT 39  ALKPHOS 48  BILITOT 0.7  PROT 6.1*  ALBUMIN 3.0*   No results for input(s): "LIPASE", "AMYLASE" in the last 168 hours. No results for input(s): "AMMONIA" in the last 168 hours. Coagulation Profile: Recent Labs  Lab 11/29/22 2255  INR 1.1   Cardiac Enzymes: No results for input(s): "CKTOTAL", "CKMB", "CKMBINDEX", "TROPONINI" in the last 168 hours. BNP (last 3 results) No results for input(s): "PROBNP" in the  last 8760 hours. HbA1C: No results for input(s): "HGBA1C" in the last 72 hours. CBG: No results for input(s): "GLUCAP" in the last 168 hours. Lipid Profile: No results for input(s): "CHOL", "HDL", "LDLCALC", "TRIG", "CHOLHDL", "LDLDIRECT" in the last 72 hours. Thyroid Function Tests: No results for input(s): "TSH", "T4TOTAL", "FREET4", "T3FREE", "THYROIDAB" in the last 72 hours. Anemia Panel: No results for input(s): "VITAMINB12", "FOLATE", "FERRITIN", "TIBC", "IRON", "RETICCTPCT" in the last 72 hours. Urine analysis:    Component Value Date/Time   COLORURINE YELLOW 11/29/2022 2249   APPEARANCEUR CLEAR 11/29/2022 2249   LABSPEC 1.017 11/29/2022 2249   PHURINE 5.0 11/29/2022 2249   GLUCOSEU >=500 (A) 11/29/2022 2249   HGBUR NEGATIVE 11/29/2022 2249   BILIRUBINUR NEGATIVE 11/29/2022 2249   KETONESUR NEGATIVE 11/29/2022 2249   PROTEINUR NEGATIVE 11/29/2022 2249   UROBILINOGEN 0.2 05/17/2014 1113   NITRITE NEGATIVE 11/29/2022 2249   LEUKOCYTESUR MODERATE (A) 11/29/2022 2249    Radiological Exams on Admission: CT Renal Stone Study  Result Date: 11/30/2022 CLINICAL DATA:  Fever and weakness. Stones suspected. Sepsis. Flank pain. EXAM: CT ABDOMEN AND PELVIS WITHOUT CONTRAST TECHNIQUE: Multidetector CT imaging of the abdomen and pelvis was performed following the standard protocol without IV contrast. RADIATION DOSE REDUCTION: This exam was performed according to the departmental dose-optimization program which includes automated exposure control, adjustment of the mA and/or kV according to patient size and/or use of iterative reconstruction technique. COMPARISON:  CT abdomen and pelvis 04/25/2022 FINDINGS: Lower chest: Patchy opacities in the lower lungs. Small right pleural effusion. Hepatobiliary: Cholecystectomy.  No acute abnormality. Pancreas: Fatty atrophy.  No acute abnormality. Spleen: Unremarkable. Adrenals/Urinary Tract: Unremarkable adrenal glands. No urinary calculi or  hydronephrosis. Bladder is within normal limits. Stomach/Bowel: Normal caliber large and small bowel. No bowel wall thickening. Stomach is within normal limits. Vascular/Lymphatic: Aortic atherosclerosis. No enlarged abdominal or pelvic lymph nodes. Reproductive: Hysterectomy. Other: No free intraperitoneal fluid or air. Musculoskeletal: No acute fracture. IMPRESSION: 1. No acute abnormality in the abdomen or pelvis. 2. Patchy opacities in the lower lungs with small right pleural effusion. Findings are concerning for pneumonia. Aortic Atherosclerosis (ICD10-I70.0). Electronically Signed   By: Minerva Fester M.D.   On: 11/30/2022 01:03   CT Head  Wo Contrast  Result Date: 11/30/2022 CLINICAL DATA:  Mental status change, unknown cause EXAM: CT HEAD WITHOUT CONTRAST TECHNIQUE: Contiguous axial images were obtained from the base of the skull through the vertex without intravenous contrast. RADIATION DOSE REDUCTION: This exam was performed according to the departmental dose-optimization program which includes automated exposure control, adjustment of the mA and/or kV according to patient size and/or use of iterative reconstruction technique. COMPARISON:  MRI head 12/17/2012 FINDINGS: Brain: Cerebral ventricle sizes are concordant with the degree of cerebral volume loss. Patchy and confluent areas of decreased attenuation are noted throughout the deep and periventricular white matter of the cerebral hemispheres bilaterally, compatible with chronic microvascular ischemic disease. No evidence of large-territorial acute infarction. No parenchymal hemorrhage. No mass lesion. No extra-axial collection. No mass effect or midline shift. No hydrocephalus. Basilar cisterns are patent. Vascular: No hyperdense vessel. Skull: No acute fracture or focal lesion. Sinuses/Orbits: Left sphenoid sinus mucosal thickening. Otherwise paranasal sinuses and mastoid air cells are clear. Bilateral lens replacement. Otherwise the orbits are  unremarkable. Other: None. IMPRESSION: No acute intracranial abnormality. Electronically Signed   By: Tish Frederickson M.D.   On: 11/30/2022 01:01   DG Chest Port 1 View  Result Date: 11/29/2022 CLINICAL DATA:  Questionable sepsis - evaluate for abnormality Cough. EXAM: PORTABLE CHEST 1 VIEW COMPARISON:  Radiograph 09/11/2022 FINDINGS: Lung volumes are low. 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 Chronic elevation of right hemidiaphragm. The heart is upper normal in size, accentuated by technique. No focal airspace disease, pleural effusion or pneumothorax. Surgical clips in the left chest wall. Reverse right shoulder arthroplasty. IMPRESSION: 1. Low lung volumes. No acute chest findings. 2. Chronic elevation of right hemidiaphragm. Electronically Signed   By: Narda Rutherford M.D.   On: 11/29/2022 23:52    EKG: Independently reviewed.   Assessment and Plan: * CAP (community acquired pneumonia) Fever, cough, WBC, PNA findings on CT AP. This occurring in setting of known underlying immunodeficiency: Hypogammaglobulinemia. PNA pathway Rocephin + azithromycin COVID, FLU, RSV neg Check RVP Tylenol PRN fever  Sepsis (HCC) Pt technically meets sepsis criteria: 2/4 SIRS (WBC + Fever) + source of infection (PNA). Though with that said: her lactate is wnl x2, and her BP has been consistently high here in the ED, EDP gave 2L IVF bolus regardless. BPs now 170s systolic. Cont prednisone 7mg  daily, dont see a reason for stress dose steroids at the moment with BPs this high. Hold off on further fluids for the moment, pt does have HFrEF history with EF 30-35% Abx as per CAP above.  Acute encephalopathy Pt with delirium secondary to PNA and fever. Per daughter, pt gets delirium like this when ever she gets even "a hint" of a fever. Id certianly say this qualifies as more than that: fevers running as high as 102.6 today in ED.  Hypogammaglobulinemia (HCC) Pt with h/o hypogammaglobulinemia.  Looks  like she was on Hizentra subcu weekly, as of July which seemed to be helping with infections. Pt usually takes med on Fridays. Per daughter, this med usually gets held when pt gets hospitalized.  Rheumatoid arthritis (HCC) Looks like pt on prednisone 7mg  daily based on prior med rec and July office note from rheumatologist. Sounds like they aren't really able to use DMARDs due to recurrent bacterial infections. Cont prednisone 7mg  daily for the moment With SBP 160, dont really see an indication to go to stress dose steroids at the moment.  Antiplatelet or antithrombotic long-term use Continue Xarelto for history of  prior DVT / PE  DMII (diabetes mellitus, type 2) (HCC) Cont home jardiance.  Chronic kidney disease, stage 3b (HCC) Creat 1.3 today looks to be c/w his baseline.  Chronic systolic CHF (congestive heart failure) (HCC) Hold home meds Use PRN BP meds (hydralazine) since pt very hypertensive at the moment      Advance Care Planning:   Code Status: Do not attempt resuscitation (DNR) PRE-ARREST INTERVENTIONS DESIRED This is her last code status during admit in July, this does tend to change back and forth per daughter.  Consults: None  Family Communication: Spoke with Daughter over phone  Severity of Illness: The appropriate patient status for this patient is INPATIENT. Inpatient status is judged to be reasonable and necessary in order to provide the required intensity of service to ensure the patient's safety. The patient's presenting symptoms, physical exam findings, and initial radiographic and laboratory data in the context of their chronic comorbidities is felt to place them at high risk for further clinical deterioration. Furthermore, it is not anticipated that the patient will be medically stable for discharge from the hospital within 2 midnights of admission.   * I certify that at the point of admission it is my clinical judgment that the patient will require inpatient  hospital care spanning beyond 2 midnights from the point of admission due to high intensity of service, high risk for further deterioration and high frequency of surveillance required.*  Author: Hillary Bow., DO 11/30/2022 6:09 AM  For on call review [email or web address].

## 2022-11-30 NOTE — Assessment & Plan Note (Addendum)
Cont home jardiance.

## 2022-11-30 NOTE — ED Notes (Signed)
Pt with large soft/loose BM, stool leaked through diaper, soiled bed linens, chucks, and monitoring cords. Peri care done. Complete bed linen change, new brief and chucks applied, monitoring cable cleansed with Clorox. Pt given additional warm blankets and repositioned in bed. Asked pt if she knew she had a BM, pt stated "yes", asked why she did not call out or asked for assistance, pt stated "I don't know, I forgot". Advised it was okay, she was not feeling well, but requested pt please call for assistance if able if she needed to have another BM or has an accident. Pt verbalized understanding.

## 2022-11-30 NOTE — Assessment & Plan Note (Addendum)
Pt with h/o hypogammaglobulinemia.  Looks like she was on Hizentra subcu weekly, as of July which seemed to be helping with infections. Pt usually takes med on Fridays. Per daughter, this med usually gets held when pt gets hospitalized.

## 2022-11-30 NOTE — Progress Notes (Signed)
[facility]                                                                              Ebony Scott, is a 72 y.o. female, DOB - [birth year], [id number] Admit date - 11/29/2022    Outpatient Primary MD for the patient is [name], [name], MD  LOS - 0  days  Chief Complaint  Patient presents with   Weakness       Brief summary   Patient is a 72 year old female with asthma, hypothyroidism, RA, hypoglobulinemia, bronchiectasis, CAD, GERD, chronic systolic CHF with EF 30 to 35%, G1 DD (per echo 07/17/2022) presented to ED with feeling ill for last 2 to 3 days.  Patient was found down at home, confused, oriented to person and place.  No report of chest pain, shortness of breath or dysuria however did have cough during encounter. Patient was febrile 88, temp of 102.6 F.  RR 17, heart rate 89, BP 155/66 O2 sats 93% on room air Hemoglobin 13.3, WBCs 14.7 CT abdomen showed patchy opacities in the lower lungs with small right pleural effusion, concerning for pneumonia  Assessment & Plan    Principal Problem: Sepsis, POA due to CAP (community acquired pneumonia) -Presented with fevers, leukocytosis, source likely due to pneumonia.  Procalcitonin 0.19 -Follow urine strep antigen, urine Legionella antigen, blood cultures -Received IVF in ED, hold off on any other fluids due to history of systolic CHF -Continue IV Zithromax, IV Rocephin -COVID-19, flu, RSV negative, follow respiratory virus panel -Obtain SLP evaluation  Active Problems:   Acute metabolic encephalopathy -Likely due to to #1, CT head negative for acute CVA -Per daughter, patient does get delirious when she has fever or infection     Rheumatoid arthritis (HCC) -Continue prednisone, not on DMARDs likely due to recurrent infections    Hypogammaglobulinemia (HCC) -Hold Hizentra for now.    Prior history of DVT/PE, antiplatelet or antithrombotic long-term use -Continue Xarelto    Chronic systolic CHF  (congestive heart failure) (HCC) -Currently stable, hold Entresto due to mild renal insufficiency -Resumed BiDil     Chronic kidney disease, stage 3b (HCC) -Baseline creatinine 1.3-1.5. -Creatinine currently at baseline    DMII (diabetes mellitus, type 2) (HCC) -Hemoglobin A1c 6.2 on 09/11/2022 -Placed on sensitive sliding scale insulin, SLP evaluation pending  Estimated body mass index is 25.39 kg/m as calculated from the following:   Height as of this encounter: 5' (1.524 m).   Weight as of this encounter: 59 kg.  Code Status: DNR DVT Prophylaxis:   rivaroxaban (XARELTO) tablet 20 mg   Level of Care: Level of care: Med-Surg Family Communication: Updated patient's daughter at the bedside Disposition Plan:      Remains inpatient appropriate:      Procedures:    Consultants:     Antimicrobials:   Anti-infectives (From admission, onward)    Start     Dose/Rate Route Frequency Ordered Stop   11/30/22 2200  azithromycin (ZITHROMAX) 500 mg in sodium chloride 0.9 % 250 mL IVPB        500 mg 250 mL/hr over 60 Minutes Intravenous Daily at bedtime 11/30/22 0552  12/04/22 2159   11/30/22 0130  azithromycin (ZITHROMAX) 500 mg in sodium chloride 0.9 % 250 mL IVPB        500 mg 250 mL/hr over 60 Minutes Intravenous  Once 11/30/22 0115 11/30/22 0309   11/29/22 2315  cefTRIAXone (ROCEPHIN) 2 g in sodium chloride 0.9 % 100 mL IVPB        2 g 200 mL/hr over 30 Minutes Intravenous Every 24 hours 11/29/22 2309 12/06/22 2314          Medications  allopurinol  100 mg Oral Daily   atorvastatin  80 mg Oral Daily   budesonide  0.5 mg Nebulization BID   clopidogrel  75 mg Oral Daily   empagliflozin  10 mg Oral QAC breakfast   gabapentin  300 mg Oral QHS   ipratropium-albuterol  3 mL Nebulization Q6H   isosorbide-hydrALAZINE  1 tablet Oral BID   predniSONE  7 mg Oral Q breakfast   rivaroxaban  20 mg Oral Q supper   traMADol  50 mg Oral QHS   traZODone  50 mg Oral QHS       Subjective:   Ebony Scott was seen and examined today.  Somewhat more alert however still confused, oriented to place and person but not to time.  Still very weak, having cough.  Afebrile.  Denies any chest pain, dizziness, abdominal pain, nausea or vomiting. No acute events overnight.    Objective:   Vitals:   11/30/22 0630 11/30/22 0800 11/30/22 0851 11/30/22 1100  BP: (!) 156/72 125/76  (!) 158/68  Pulse: 88 78  81  Resp: 17 18  (!) 24  Temp:   99.7 F (37.6 C)   TempSrc:   Oral   SpO2: 99% 95%  96%  Weight:      Height:        Intake/Output Summary (Last 24 hours) at 11/30/2022 1125 Last data filed at 11/30/2022 1610 Gross per 24 hour  Intake 3259.41 ml  Output 300 ml  Net 2959.41 ml     Wt Readings from Last 3 Encounters:  11/29/22 59 kg  11/22/22 57.6 kg  09/11/22 57.6 kg     Exam General: Alert and oriented x 2, place and person, NAD, coughing, ill-appearing Cardiovascular: S1 S2 auscultated,  RRR Respiratory: Coarse rhonchi bilaterally Gastrointestinal: Soft, nontender, nondistended, + bowel sounds Ext: no pedal edema bilaterally Neuro: moving all 4 extremities spontaneously Psych: confused    Data Reviewed:  I have personally reviewed following labs    CBC Lab Results  Component Value Date   WBC 14.8 (H) 11/30/2022   RBC 3.24 (L) 11/30/2022   HGB 11.4 (L) 11/30/2022   HCT 35.0 (L) 11/30/2022   MCV 108.0 (H) 11/30/2022   MCH 35.2 (H) 11/30/2022   PLT 144 (L) 11/30/2022   MCHC 32.6 11/30/2022   RDW 16.2 (H) 11/30/2022   LYMPHSABS 1.9 11/29/2022   MONOABS 2.0 (H) 11/29/2022   EOSABS 0.4 11/29/2022   BASOSABS 0.1 11/29/2022     Last metabolic panel Lab Results  Component Value Date   NA 141 11/30/2022   K 3.8 11/30/2022   CL 106 11/30/2022   CO2 22 11/30/2022   BUN 25 (H) 11/30/2022   CREATININE 1.30 (H) 11/30/2022   GLUCOSE 78 11/30/2022   GFRNONAA 43 (L) 11/30/2022   GFRAA 40 (L) 08/25/2020   CALCIUM 9.0 11/30/2022    PHOS 6.5 (H) 10/04/2020   PROT 6.1 (L) 11/30/2022   ALBUMIN 3.0 (L) 11/30/2022   LABGLOB  2.3 08/07/2022   AGRATIO 1.7 12/21/2020   BILITOT 0.7 11/30/2022   ALKPHOS 48 11/30/2022   AST 41 11/30/2022   ALT 39 11/30/2022   ANIONGAP 13 11/30/2022    CBG (last 3)  No results for input(s): "GLUCAP" in the last 72 hours.    Coagulation Profile: Recent Labs  Lab 11/29/22 2255  INR 1.1     Radiology Studies: I have personally reviewed the imaging studies  CT Renal Stone Study  Result Date: 11/30/2022 CLINICAL DATA:  Fever and weakness. Stones suspected. Sepsis. Flank pain. EXAM: CT ABDOMEN AND PELVIS WITHOUT CONTRAST TECHNIQUE: Multidetector CT imaging of the abdomen and pelvis was performed following the standard protocol without IV contrast. RADIATION DOSE REDUCTION: This exam was performed according to the departmental dose-optimization program which includes automated exposure control, adjustment of the mA and/or kV according to patient size and/or use of iterative reconstruction technique. COMPARISON:  CT abdomen and pelvis 04/25/2022 FINDINGS: Lower chest: Patchy opacities in the lower lungs. Small right pleural effusion. Hepatobiliary: Cholecystectomy.  No acute abnormality. Pancreas: Fatty atrophy.  No acute abnormality. Spleen: Unremarkable. Adrenals/Urinary Tract: Unremarkable adrenal glands. No urinary calculi or hydronephrosis. Bladder is within normal limits. Stomach/Bowel: Normal caliber large and small bowel. No bowel wall thickening. Stomach is within normal limits. Vascular/Lymphatic: Aortic atherosclerosis. No enlarged abdominal or pelvic lymph nodes. Reproductive: Hysterectomy. Other: No free intraperitoneal fluid or air. Musculoskeletal: No acute fracture. IMPRESSION: 1. No acute abnormality in the abdomen or pelvis. 2. Patchy opacities in the lower lungs with small right pleural effusion. Findings are concerning for pneumonia. Aortic Atherosclerosis (ICD10-I70.0).  Electronically Signed   By: Minerva Fester M.D.   On: 11/30/2022 01:03   CT Head Wo Contrast  Result Date: 11/30/2022 CLINICAL DATA:  Mental status change, unknown cause EXAM: CT HEAD WITHOUT CONTRAST TECHNIQUE: Contiguous axial images were obtained from the base of the skull through the vertex without intravenous contrast. RADIATION DOSE REDUCTION: This exam was performed according to the departmental dose-optimization program which includes automated exposure control, adjustment of the mA and/or kV according to patient size and/or use of iterative reconstruction technique. COMPARISON:  MRI head 12/17/2012 FINDINGS: Brain: Cerebral ventricle sizes are concordant with the degree of cerebral volume loss. Patchy and confluent areas of decreased attenuation are noted throughout the deep and periventricular white matter of the cerebral hemispheres bilaterally, compatible with chronic microvascular ischemic disease. No evidence of large-territorial acute infarction. No parenchymal hemorrhage. No mass lesion. No extra-axial collection. No mass effect or midline shift. No hydrocephalus. Basilar cisterns are patent. Vascular: No hyperdense vessel. Skull: No acute fracture or focal lesion. Sinuses/Orbits: Left sphenoid sinus mucosal thickening. Otherwise paranasal sinuses and mastoid air cells are clear. Bilateral lens replacement. Otherwise the orbits are unremarkable. Other: None. IMPRESSION: No acute intracranial abnormality. Electronically Signed   By: Tish Frederickson M.D.   On: 11/30/2022 01:01   DG Chest Port 1 View  Result Date: 11/29/2022 CLINICAL DATA:  Questionable sepsis - evaluate for abnormality Cough. EXAM: PORTABLE CHEST 1 VIEW COMPARISON:  Radiograph 09/11/2022 FINDINGS: Lung volumes are low. 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 Chronic elevation of right hemidiaphragm. The heart is upper normal in size, accentuated by technique. No focal airspace disease, pleural effusion or pneumothorax.  Surgical clips in the left chest wall. Reverse right shoulder arthroplasty. IMPRESSION: 1. Low lung volumes. No acute chest findings. 2. Chronic elevation of right hemidiaphragm. Electronically Signed   By: Narda Rutherford M.D.   On: 11/29/2022 23:52  Thad Ranger M.D. [facility] 11/30/2022, 11:25 AM  Available via Epic secure chat 7am-7pm After 7 pm, please refer to night coverage provider listed on amion.

## 2022-11-30 NOTE — Plan of Care (Signed)
  Problem: Nutritional: Goal: Maintenance of adequate nutrition will improve Outcome: Progressing   Problem: Skin Integrity: Goal: Risk for impaired skin integrity will decrease Outcome: Progressing   Problem: Clinical Measurements: Goal: Respiratory complications will improve Outcome: Progressing   Problem: Activity: Goal: Risk for activity intolerance will decrease Outcome: Progressing

## 2022-11-30 NOTE — Assessment & Plan Note (Addendum)
Hold home meds Use PRN BP meds (hydralazine) since pt very hypertensive at the moment

## 2022-11-30 NOTE — Assessment & Plan Note (Signed)
Continue Xarelto for history of prior DVT / PE

## 2022-11-30 NOTE — Assessment & Plan Note (Addendum)
Pt technically meets sepsis criteria: 2/4 SIRS (WBC + Fever) + source of infection (PNA). Though with that said: her lactate is wnl x2, and her BP has been consistently high here in the ED, EDP gave 2L IVF bolus regardless. BPs now 170s systolic. Cont prednisone 7mg  daily, dont see a reason for stress dose steroids at the moment with BPs this high. Hold off on further fluids for the moment, pt does have HFrEF history with EF 30-35% Abx as per CAP above.

## 2022-12-01 ENCOUNTER — Inpatient Hospital Stay (HOSPITAL_COMMUNITY): Payer: Medicare PPO

## 2022-12-01 DIAGNOSIS — R4182 Altered mental status, unspecified: Secondary | ICD-10-CM | POA: Diagnosis not present

## 2022-12-01 DIAGNOSIS — D801 Nonfamilial hypogammaglobulinemia: Secondary | ICD-10-CM | POA: Diagnosis not present

## 2022-12-01 DIAGNOSIS — J189 Pneumonia, unspecified organism: Secondary | ICD-10-CM | POA: Diagnosis not present

## 2022-12-01 DIAGNOSIS — G9340 Encephalopathy, unspecified: Secondary | ICD-10-CM | POA: Diagnosis not present

## 2022-12-01 LAB — GLUCOSE, CAPILLARY
Glucose-Capillary: 77 mg/dL (ref 70–99)
Glucose-Capillary: 84 mg/dL (ref 70–99)
Glucose-Capillary: 88 mg/dL (ref 70–99)
Glucose-Capillary: 156 mg/dL — ABNORMAL HIGH (ref 70–99)

## 2022-12-01 LAB — CBC
HCT: 31.9 % — ABNORMAL LOW (ref 36.0–46.0)
Hemoglobin: 10.3 g/dL — ABNORMAL LOW (ref 12.0–15.0)
MCH: 34.7 pg — ABNORMAL HIGH (ref 26.0–34.0)
MCHC: 32.3 g/dL (ref 30.0–36.0)
MCV: 107.4 fL — ABNORMAL HIGH (ref 80.0–100.0)
Platelets: 145 10*3/uL — ABNORMAL LOW (ref 150–400)
RBC: 2.97 MIL/uL — ABNORMAL LOW (ref 3.87–5.11)
RDW: 16 % — ABNORMAL HIGH (ref 11.5–15.5)
WBC: 11.5 10*3/uL — ABNORMAL HIGH (ref 4.0–10.5)
nRBC: 0 % (ref 0.0–0.2)

## 2022-12-01 LAB — BASIC METABOLIC PANEL
Anion gap: 12 (ref 5–15)
BUN: 19 mg/dL (ref 8–23)
CO2: 24 mmol/L (ref 22–32)
Calcium: 9.2 mg/dL (ref 8.9–10.3)
Chloride: 104 mmol/L (ref 98–111)
Creatinine, Ser: 1.21 mg/dL — ABNORMAL HIGH (ref 0.44–1.00)
GFR, Estimated: 47 mL/min — ABNORMAL LOW (ref >60)
Glucose, Bld: 81 mg/dL (ref 70–99)
Potassium: 4 mmol/L (ref 3.5–5.1)
Sodium: 140 mmol/L (ref 135–145)

## 2022-12-01 LAB — PROCALCITONIN: Procalcitonin: 0.88 ng/mL

## 2022-12-01 MED ORDER — CARMEX CLASSIC LIP BALM EX OINT
1.0000 | TOPICAL_OINTMENT | CUTANEOUS | Status: DC | PRN
  Administered 2022-12-01: 1 via TOPICAL
  Filled 2022-12-01: qty 10

## 2022-12-01 MED ORDER — GUAIFENESIN-DM 100-10 MG/5ML PO SYRP
5.0000 mL | ORAL_SOLUTION | ORAL | Status: DC | PRN
  Administered 2022-12-01 – 2022-12-03 (×4): 5 mL via ORAL
  Filled 2022-12-01 (×5): qty 5

## 2022-12-01 MED ORDER — ENSURE ENLIVE PO LIQD
237.0000 mL | Freq: Two times a day (BID) | ORAL | Status: DC
  Administered 2022-12-02 – 2022-12-06 (×8): 237 mL via ORAL

## 2022-12-01 MED ORDER — SACUBITRIL-VALSARTAN 24-26 MG PO TABS
1.0000 | ORAL_TABLET | Freq: Two times a day (BID) | ORAL | Status: DC
  Administered 2022-12-01 – 2022-12-06 (×11): 1 via ORAL
  Filled 2022-12-01 (×11): qty 1

## 2022-12-01 NOTE — Progress Notes (Signed)
[facility]                                                                              Ebony Scott, is a 72 y.o. female, DOB - Jul 23, 1950, [id number] Admit date - 11/29/2022    Outpatient Primary MD for the patient is [name], [name], MD  LOS - 1  days  Chief Complaint  Patient presents with   Weakness       Brief summary   Patient is a 72 year old female with asthma, hypothyroidism, RA, hypoglobulinemia, bronchiectasis, CAD, GERD, chronic systolic CHF with EF 30 to 35%, G1 DD (per echo 07/17/2022) presented to ED with feeling ill for last 2 to 3 days.  Patient was found down at home, confused, oriented to person and place.  No report of chest pain, shortness of breath or dysuria however did have cough during encounter. Patient was febrile 88, temp of 102.6 F.  RR 17, heart rate 89, BP 155/66 O2 sats 93% on room air Hemoglobin 13.3, WBCs 14.7 CT abdomen showed patchy opacities in the lower lungs with small right pleural effusion, concerning for pneumonia  Assessment & Plan    Principal Problem: Sepsis, POA due to CAP (community acquired pneumonia) -Presented with fevers, leukocytosis, source likely due to pneumonia.  Procalcitonin 0.19 -Urine strep antigen negative, urine Legionella antigen pending, blood cultures negative so far -Received IVF in ED, hold off on any other fluids due to history of systolic CHF -COVID-19, flu, RSV negative, respiratory virus panel negative -Continue IV Zithromax, Rocephin -Continue dysphagia 3 so left diet  Active Problems:   Acute metabolic encephalopathy -Likely due to to #1, CT head negative for acute CVA -Per daughter, patient does get delirious when she has fever or infection -Mental status improving     Rheumatoid arthritis (HCC) -Continue prednisone, not on DMARDs likely due to recurrent infections    Hypogammaglobulinemia (HCC) -Hold Hizentra for now.    Prior history of DVT/PE, antiplatelet or  antithrombotic long-term use -Continue Xarelto    Chronic systolic CHF (congestive heart failure) (HCC) -Currently stable, Entresto was on hold due to mild renal insufficiency.  Creatinine now at baseline, -Will resume Entresto -Continue BiDil    Chronic kidney disease, stage 3b (HCC) -Baseline creatinine 1.3-1.5. -Creatinine currently at baseline    DMII (diabetes mellitus, type 2) (HCC) -Hemoglobin A1c 6.2 on 09/11/2022 -CBG (last 3)  Recent Labs    11/30/22 2104 12/01/22 0718 12/01/22 1118  GLUCAP 91 77 84   CBG stable, continue SSI  Estimated body mass index is 26.31 kg/m as calculated from the following:   Height as of this encounter: 5' (1.524 m).   Weight as of this encounter: 61.1 kg.  Code Status: DNR DVT Prophylaxis:   rivaroxaban (XARELTO) tablet 20 mg   Level of Care: Level of care: Med-Surg Family Communication: Updated patient's daughter at the bedside 10/11, called her phone but went to voice mail.  Disposition Plan:      Remains inpatient appropriate:      Procedures:    Consultants:     Antimicrobials:   Anti-infectives (From admission, onward)    Start  Dose/Rate Route Frequency Ordered Stop   11/30/22 2200  azithromycin (ZITHROMAX) 500 mg in sodium chloride 0.9 % 250 mL IVPB        500 mg 250 mL/hr over 60 Minutes Intravenous Daily at bedtime 11/30/22 0552 12/04/22 2159   11/30/22 0130  azithromycin (ZITHROMAX) 500 mg in sodium chloride 0.9 % 250 mL IVPB        500 mg 250 mL/hr over 60 Minutes Intravenous  Once 11/30/22 0115 11/30/22 0309   11/29/22 2315  cefTRIAXone (ROCEPHIN) 2 g in sodium chloride 0.9 % 100 mL IVPB        2 g 200 mL/hr over 30 Minutes Intravenous Every 24 hours 11/29/22 2309 12/06/22 2314          Medications  allopurinol  100 mg Oral Daily   atorvastatin  80 mg Oral Daily   budesonide  0.5 mg Nebulization BID   clopidogrel  75 mg Oral Daily   empagliflozin  10 mg Oral QAC breakfast   gabapentin  300 mg  Oral QHS   insulin aspart  0-5 Units Subcutaneous QHS   insulin aspart  0-6 Units Subcutaneous TID WC   ipratropium-albuterol  3 mL Nebulization BID   isosorbide-hydrALAZINE  1 tablet Oral BID   predniSONE  7 mg Oral Q breakfast   rivaroxaban  20 mg Oral Q supper   traMADol  50 mg Oral QHS   traZODone  50 mg Oral QHS      Subjective:   Ebony Scott was seen and examined today.  more alert, awake and oriented today however still very weak, coughing.  No acute wheezing.  No chest pain, abdominal pain, nausea or vomiting.      Objective:   Vitals:   11/30/22 1930 11/30/22 2207 12/01/22 0204 12/01/22 0822  BP:  (!) 145/129 (!) 121/59   Pulse:  92 76   Resp:  16 16   Temp:  98.9 F (37.2 C) 98.2 F (36.8 C)   TempSrc:      SpO2: 98% 95% 90% 91%  Weight:      Height:        Intake/Output Summary (Last 24 hours) at 12/01/2022 1155 Last data filed at 12/01/2022 1121 Gross per 24 hour  Intake 498 ml  Output 650 ml  Net -152 ml     Wt Readings from Last 3 Encounters:  11/30/22 61.1 kg  11/22/22 57.6 kg  09/11/22 57.6 kg   Physical Exam General: More alert and oriented today, NAD, ill-appearing Cardiovascular: S1 S2 clear, RRR.  Respiratory: Bilateral rhonchi Gastrointestinal: Soft, nontender, nondistended, NBS Ext: no pedal edema bilaterally Neuro: moving all 4 extremities spontaneously Psych: very weak and deconditioned,   Data Reviewed:  I have personally reviewed following labs    CBC Lab Results  Component Value Date   WBC 11.5 (H) 12/01/2022   RBC 2.97 (L) 12/01/2022   HGB 10.3 (L) 12/01/2022   HCT 31.9 (L) 12/01/2022   MCV 107.4 (H) 12/01/2022   MCH 34.7 (H) 12/01/2022   PLT 145 (L) 12/01/2022   MCHC 32.3 12/01/2022   RDW 16.0 (H) 12/01/2022   LYMPHSABS 1.9 11/29/2022   MONOABS 2.0 (H) 11/29/2022   EOSABS 0.4 11/29/2022   BASOSABS 0.1 11/29/2022     Last metabolic panel Lab Results  Component Value Date   NA 140 12/01/2022   K 4.0  12/01/2022   CL 104 12/01/2022   CO2 24 12/01/2022   BUN 19 12/01/2022   CREATININE 1.21 (H)  12/01/2022   GLUCOSE 81 12/01/2022   GFRNONAA 47 (L) 12/01/2022   GFRAA 40 (L) 08/25/2020   CALCIUM 9.2 12/01/2022   PHOS 6.5 (H) 10/04/2020   PROT 6.1 (L) 11/30/2022   ALBUMIN 3.0 (L) 11/30/2022   LABGLOB 2.3 08/07/2022   AGRATIO 1.7 12/21/2020   BILITOT 0.7 11/30/2022   ALKPHOS 48 11/30/2022   AST 41 11/30/2022   ALT 39 11/30/2022   ANIONGAP 12 12/01/2022    CBG (last 3)  Recent Labs    11/30/22 2104 12/01/22 0718 12/01/22 1118  GLUCAP 91 77 84      Coagulation Profile: Recent Labs  Lab 11/29/22 2255  INR 1.1     Radiology Studies: I have personally reviewed the imaging studies  CT Renal Stone Study  Result Date: 11/30/2022 CLINICAL DATA:  Fever and weakness. Stones suspected. Sepsis. Flank pain. EXAM: CT ABDOMEN AND PELVIS WITHOUT CONTRAST TECHNIQUE: Multidetector CT imaging of the abdomen and pelvis was performed following the standard protocol without IV contrast. RADIATION DOSE REDUCTION: This exam was performed according to the departmental dose-optimization program which includes automated exposure control, adjustment of the mA and/or kV according to patient size and/or use of iterative reconstruction technique. COMPARISON:  CT abdomen and pelvis 04/25/2022 FINDINGS: Lower chest: Patchy opacities in the lower lungs. Small right pleural effusion. Hepatobiliary: Cholecystectomy.  No acute abnormality. Pancreas: Fatty atrophy.  No acute abnormality. Spleen: Unremarkable. Adrenals/Urinary Tract: Unremarkable adrenal glands. No urinary calculi or hydronephrosis. Bladder is within normal limits. Stomach/Bowel: Normal caliber large and small bowel. No bowel wall thickening. Stomach is within normal limits. Vascular/Lymphatic: Aortic atherosclerosis. No enlarged abdominal or pelvic lymph nodes. Reproductive: Hysterectomy. Other: No free intraperitoneal fluid or air. Musculoskeletal:  No acute fracture. IMPRESSION: 1. No acute abnormality in the abdomen or pelvis. 2. Patchy opacities in the lower lungs with small right pleural effusion. Findings are concerning for pneumonia. Aortic Atherosclerosis (ICD10-I70.0). Electronically Signed   By: Minerva Fester M.D.   On: 11/30/2022 01:03   CT Head Wo Contrast  Result Date: 11/30/2022 CLINICAL DATA:  Mental status change, unknown cause EXAM: CT HEAD WITHOUT CONTRAST TECHNIQUE: Contiguous axial images were obtained from the base of the skull through the vertex without intravenous contrast. RADIATION DOSE REDUCTION: This exam was performed according to the departmental dose-optimization program which includes automated exposure control, adjustment of the mA and/or kV according to patient size and/or use of iterative reconstruction technique. COMPARISON:  MRI head 12/17/2012 FINDINGS: Brain: Cerebral ventricle sizes are concordant with the degree of cerebral volume loss. Patchy and confluent areas of decreased attenuation are noted throughout the deep and periventricular white matter of the cerebral hemispheres bilaterally, compatible with chronic microvascular ischemic disease. No evidence of large-territorial acute infarction. No parenchymal hemorrhage. No mass lesion. No extra-axial collection. No mass effect or midline shift. No hydrocephalus. Basilar cisterns are patent. Vascular: No hyperdense vessel. Skull: No acute fracture or focal lesion. Sinuses/Orbits: Left sphenoid sinus mucosal thickening. Otherwise paranasal sinuses and mastoid air cells are clear. Bilateral lens replacement. Otherwise the orbits are unremarkable. Other: None. IMPRESSION: No acute intracranial abnormality. Electronically Signed   By: Tish Frederickson M.D.   On: 11/30/2022 01:01   DG Chest Port 1 View  Result Date: 11/29/2022 CLINICAL DATA:  Questionable sepsis - evaluate for abnormality Cough. EXAM: PORTABLE CHEST 1 VIEW COMPARISON:  Radiograph 09/11/2022 FINDINGS:  Lung volumes are low. 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 Chronic elevation of right hemidiaphragm. The heart is upper normal in size, accentuated by  technique. No focal airspace disease, pleural effusion or pneumothorax. Surgical clips in the left chest wall. Reverse right shoulder arthroplasty. IMPRESSION: 1. Low lung volumes. No acute chest findings. 2. Chronic elevation of right hemidiaphragm. Electronically Signed   By: Narda Rutherford M.D.   On: 11/29/2022 23:52       Quanika Solem M.D. [facility] 12/01/2022, 11:55 AM  Available via Epic secure chat 7am-7pm After 7 pm, please refer to night coverage provider listed on amion.

## 2022-12-01 NOTE — Plan of Care (Signed)
Problem: Education: Goal: Knowledge of cardiac device and self-care will improve Outcome: Progressing Goal: Ability to safely manage health related needs after discharge will improve Outcome: Progressing Goal: Individualized Educational Video(s) Outcome: Progressing   Problem: Cardiac: Goal: Ability to achieve and maintain adequate cardiopulmonary perfusion will improve Outcome: Progressing   Problem: Education: Goal: Ability to describe self-care measures that may prevent or decrease complications (Diabetes Survival Skills Education) will improve Outcome: Progressing Goal: Individualized Educational Video(s) Outcome: Progressing   Problem: Coping: Goal: Ability to adjust to condition or change in health will improve Outcome: Progressing   Problem: Fluid Volume: Goal: Ability to maintain a balanced intake and output will improve Outcome: Progressing   Problem: Health Behavior/Discharge Planning: Goal: Ability to identify and utilize available resources and services will improve Outcome: Progressing Goal: Ability to manage health-related needs will improve Outcome: Progressing   Problem: Metabolic: Goal: Ability to maintain appropriate glucose levels will improve Outcome: Progressing   Problem: Nutritional: Goal: Maintenance of adequate nutrition will improve Outcome: Progressing Goal: Progress toward achieving an optimal weight will improve Outcome: Progressing   Problem: Skin Integrity: Goal: Risk for impaired skin integrity will decrease Outcome: Progressing   Problem: Tissue Perfusion: Goal: Adequacy of tissue perfusion will improve Outcome: Progressing   Problem: Education: Goal: Knowledge of General Education information will improve Description: Including pain rating scale, medication(s)/side effects and non-pharmacologic comfort measures Outcome: Progressing   Problem: Health Behavior/Discharge Planning: Goal: Ability to manage health-related needs  will improve Outcome: Progressing   Problem: Clinical Measurements: Goal: Ability to maintain clinical measurements within normal limits will improve Outcome: Progressing Goal: Will remain free from infection Outcome: Progressing Goal: Diagnostic test results will improve Outcome: Progressing Goal: Respiratory complications will improve Outcome: Progressing Goal: Cardiovascular complication will be avoided Outcome: Progressing   Problem: Activity: Goal: Risk for activity intolerance will decrease Outcome: Progressing   Problem: Nutrition: Goal: Adequate nutrition will be maintained Outcome: Progressing   Problem: Coping: Goal: Level of anxiety will decrease Outcome: Progressing   Problem: Elimination: Goal: Will not experience complications related to bowel motility Outcome: Progressing Goal: Will not experience complications related to urinary retention Outcome: Progressing   Problem: Pain Managment: Goal: General experience of comfort will improve Outcome: Progressing   Problem: Safety: Goal: Ability to remain free from injury will improve Outcome: Progressing   Problem: Skin Integrity: Goal: Risk for impaired skin integrity will decrease Outcome: Progressing

## 2022-12-02 DIAGNOSIS — D801 Nonfamilial hypogammaglobulinemia: Secondary | ICD-10-CM | POA: Diagnosis not present

## 2022-12-02 DIAGNOSIS — R4182 Altered mental status, unspecified: Secondary | ICD-10-CM | POA: Diagnosis not present

## 2022-12-02 DIAGNOSIS — J189 Pneumonia, unspecified organism: Secondary | ICD-10-CM | POA: Diagnosis not present

## 2022-12-02 DIAGNOSIS — G9340 Encephalopathy, unspecified: Secondary | ICD-10-CM | POA: Diagnosis not present

## 2022-12-02 LAB — CBC
HCT: 32.1 % — ABNORMAL LOW (ref 36.0–46.0)
Hemoglobin: 10.3 g/dL — ABNORMAL LOW (ref 12.0–15.0)
MCH: 34.6 pg — ABNORMAL HIGH (ref 26.0–34.0)
MCHC: 32.1 g/dL (ref 30.0–36.0)
MCV: 107.7 fL — ABNORMAL HIGH (ref 80.0–100.0)
Platelets: 138 10*3/uL — ABNORMAL LOW (ref 150–400)
RBC: 2.98 MIL/uL — ABNORMAL LOW (ref 3.87–5.11)
RDW: 16.1 % — ABNORMAL HIGH (ref 11.5–15.5)
WBC: 13.5 10*3/uL — ABNORMAL HIGH (ref 4.0–10.5)
nRBC: 0 % (ref 0.0–0.2)

## 2022-12-02 LAB — GLUCOSE, CAPILLARY
Glucose-Capillary: 103 mg/dL — ABNORMAL HIGH (ref 70–99)
Glucose-Capillary: 182 mg/dL — ABNORMAL HIGH (ref 70–99)
Glucose-Capillary: 165 mg/dL — ABNORMAL HIGH (ref 70–99)
Glucose-Capillary: 188 mg/dL — ABNORMAL HIGH (ref 70–99)

## 2022-12-02 LAB — BASIC METABOLIC PANEL
Anion gap: 16 — ABNORMAL HIGH (ref 5–15)
BUN: 22 mg/dL (ref 8–23)
CO2: 20 mmol/L — ABNORMAL LOW (ref 22–32)
Calcium: 8.9 mg/dL (ref 8.9–10.3)
Chloride: 103 mmol/L (ref 98–111)
Creatinine, Ser: 1.31 mg/dL — ABNORMAL HIGH (ref 0.44–1.00)
GFR, Estimated: 43 mL/min — ABNORMAL LOW (ref >60)
Glucose, Bld: 110 mg/dL — ABNORMAL HIGH (ref 70–99)
Potassium: 3.8 mmol/L (ref 3.5–5.1)
Sodium: 139 mmol/L (ref 135–145)

## 2022-12-02 LAB — LEGIONELLA PNEUMOPHILA SEROGP 1 UR AG: L. pneumophila Serogp 1 Ur Ag: NEGATIVE

## 2022-12-02 MED ORDER — DICLOFENAC SODIUM 1 % EX GEL
2.0000 g | Freq: Three times a day (TID) | CUTANEOUS | Status: DC | PRN
  Administered 2022-12-03 – 2022-12-04 (×2): 2 g via TOPICAL
  Filled 2022-12-02: qty 100

## 2022-12-02 NOTE — Plan of Care (Signed)
  Problem: Education: Goal: Knowledge of cardiac device and self-care will improve Outcome: Progressing Goal: Ability to safely manage health related needs after discharge will improve Outcome: Progressing Goal: Individualized Educational Video(s) Outcome: Progressing   Problem: Cardiac: Goal: Ability to achieve and maintain adequate cardiopulmonary perfusion will improve Outcome: Progressing   Problem: Education: Goal: Ability to describe self-care measures that may prevent or decrease complications (Diabetes Survival Skills Education) will improve Outcome: Progressing Goal: Individualized Educational Video(s) Outcome: Progressing   Problem: Coping: Goal: Ability to adjust to condition or change in health will improve Outcome: Progressing   Problem: Fluid Volume: Goal: Ability to maintain a balanced intake and output will improve Outcome: Progressing   Problem: Health Behavior/Discharge Planning: Goal: Ability to identify and utilize available resources and services will improve Outcome: Progressing Goal: Ability to manage health-related needs will improve Outcome: Progressing   Problem: Metabolic: Goal: Ability to maintain appropriate glucose levels will improve Outcome: Progressing   Problem: Nutritional: Goal: Maintenance of adequate nutrition will improve Outcome: Progressing Goal: Progress toward achieving an optimal weight will improve Outcome: Progressing   Problem: Skin Integrity: Goal: Risk for impaired skin integrity will decrease Outcome: Progressing   Problem: Tissue Perfusion: Goal: Adequacy of tissue perfusion will improve Outcome: Progressing   Problem: Education: Goal: Knowledge of General Education information will improve Description: Including pain rating scale, medication(s)/side effects and non-pharmacologic comfort measures Outcome: Progressing   Problem: Health Behavior/Discharge Planning: Goal: Ability to manage health-related needs  will improve Outcome: Progressing   Problem: Clinical Measurements: Goal: Ability to maintain clinical measurements within normal limits will improve Outcome: Progressing Goal: Will remain free from infection Outcome: Progressing Goal: Diagnostic test results will improve Outcome: Progressing Goal: Respiratory complications will improve Outcome: Progressing Goal: Cardiovascular complication will be avoided Outcome: Progressing   Problem: Activity: Goal: Risk for activity intolerance will decrease Outcome: Progressing   Problem: Nutrition: Goal: Adequate nutrition will be maintained Outcome: Progressing   Problem: Coping: Goal: Level of anxiety will decrease Outcome: Progressing   Problem: Elimination: Goal: Will not experience complications related to bowel motility Outcome: Progressing Goal: Will not experience complications related to urinary retention Outcome: Progressing   Problem: Pain Managment: Goal: General experience of comfort will improve Outcome: Progressing   Problem: Safety: Goal: Ability to remain free from injury will improve Outcome: Progressing   Problem: Skin Integrity: Goal: Risk for impaired skin integrity will decrease Outcome: Progressing

## 2022-12-02 NOTE — Progress Notes (Signed)
[facility]                                                                              Ebony Scott, is a 72 y.o. female, DOB - [birth year], ZOX:[id number] Admit date - 11/29/2022    Outpatient Primary MD for the patient is [name], [name], MD  LOS - 2  days  Chief Complaint  Patient presents with   Weakness       Brief summary   Patient is a 72 year old female with asthma, hypothyroidism, RA, hypoglobulinemia, bronchiectasis, CAD, GERD, chronic systolic CHF with EF 30 to 35%, G1 DD (per echo 07/17/2022) presented to ED with feeling ill for last 2 to 3 days.  Patient was found down at home, confused, oriented to person and place.  No report of chest pain, shortness of breath or dysuria however did have cough during encounter. Patient was febrile 88, temp of 102.6 F.  RR 17, heart rate 89, BP 155/66 O2 sats 93% on room air Hemoglobin 13.3, WBCs 14.7 CT abdomen showed patchy opacities in the lower lungs with small right pleural effusion, concerning for pneumonia  Assessment & Plan    Principal Problem: Sepsis, POA due to CAP (community acquired pneumonia) -Presented with fevers, leukocytosis, source likely due to pneumonia.  Procalcitonin 0.19 -Urine strep antigen negative, urine Legionella antigen pending, blood cultures negative so far -Sepsis physiology resolving -COVID-19, flu, RSV negative, respiratory virus panel negative -Continue IV Zithromax, Rocephin, improving   Active Problems:   Acute metabolic encephalopathy -Likely due to to #1, CT head negative for acute CVA -Per daughter, patient does get delirious when she has fever or infection -Mental status improving, alert and oriented today, was texting on her phone during my encounter.  Responded appropriately to all verbal commands.    Rheumatoid arthritis (HCC) -Continue prednisone, not on DMARDs likely due to recurrent infections    Hypogammaglobulinemia (HCC) -Hold Hizentra for now.    Prior  history of DVT/PE, antiplatelet or antithrombotic long-term use -Continue Xarelto    Chronic systolic CHF (congestive heart failure) (HCC) -Continue BiDil, Entresto  -Creatinine at baseline    Chronic kidney disease, stage 3b (HCC) -Baseline creatinine 1.3-1.5. -Creatinine currently at baseline    DMII (diabetes mellitus, type 2) (HCC) -Hemoglobin A1c 6.2 on 09/11/2022 -CBG (last 3)  Recent Labs    12/01/22 2109 12/02/22 0748 12/02/22 1119  GLUCAP 156* 103* 182*   CBG stable, continue SSI  Estimated body mass index is 26.31 kg/m as calculated from the following:   Height as of this encounter: 5' (1.524 m).   Weight as of this encounter: 61.1 kg.  Code Status: DNR DVT Prophylaxis:   rivaroxaban (XARELTO) tablet 20 mg   Level of Care: Level of care: Med-Surg Family Communication: Updated patient's daughter, Danaye Youngren on the phone   Disposition Plan:      Remains inpatient appropriate:   Start PT OT evaluation today   Procedures:    Consultants:     Antimicrobials:   Anti-infectives (From admission, onward)    Start     Dose/Rate Route Frequency Ordered Stop   11/30/22 2200  azithromycin (ZITHROMAX)  500 mg in sodium chloride 0.9 % 250 mL IVPB        500 mg 250 mL/hr over 60 Minutes Intravenous Daily at bedtime 11/30/22 0552 12/04/22 2159   11/30/22 0130  azithromycin (ZITHROMAX) 500 mg in sodium chloride 0.9 % 250 mL IVPB        500 mg 250 mL/hr over 60 Minutes Intravenous  Once 11/30/22 0115 11/30/22 0309   11/29/22 2315  cefTRIAXone (ROCEPHIN) 2 g in sodium chloride 0.9 % 100 mL IVPB        2 g 200 mL/hr over 30 Minutes Intravenous Every 24 hours 11/29/22 2309 12/06/22 2314          Medications  allopurinol  100 mg Oral Daily   atorvastatin  80 mg Oral Daily   budesonide  0.5 mg Nebulization BID   clopidogrel  75 mg Oral Daily   empagliflozin  10 mg Oral QAC breakfast   feeding supplement  237 mL Oral BID BM   gabapentin  300 mg Oral QHS    insulin aspart  0-5 Units Subcutaneous QHS   insulin aspart  0-6 Units Subcutaneous TID WC   ipratropium-albuterol  3 mL Nebulization BID   isosorbide-hydrALAZINE  1 tablet Oral BID   predniSONE  7 mg Oral Q breakfast   rivaroxaban  20 mg Oral Q supper   sacubitril-valsartan  1 tablet Oral BID   traMADol  50 mg Oral QHS   traZODone  50 mg Oral QHS      Subjective:   Ebony Scott was seen and examined today.  Much more alert and awake today, oriented, was texting on the phone when I arrived.  Still feels she does not have much appetite and does not want to eat.  No chest pain, abdominal pain, nausea or vomiting.  Yesterday spiked fever 102.3 F.  Afebrile today.  Objective:   Vitals:   12/02/22 0242 12/02/22 0635 12/02/22 0821 12/02/22 1014  BP: (!) 126/58 (!) 154/69  (!) 142/66  Pulse: 83 86  100  Resp: 20 20  14   Temp: 98.9 F (37.2 C)   97.7 F (36.5 C)  TempSrc: Oral     SpO2: 95% 94% 94% 92%  Weight:      Height:        Intake/Output Summary (Last 24 hours) at 12/02/2022 1320 Last data filed at 12/01/2022 1830 Gross per 24 hour  Intake 0 ml  Output 150 ml  Net -150 ml     Wt Readings from Last 3 Encounters:  11/30/22 61.1 kg  11/22/22 57.6 kg  09/11/22 57.6 kg    Physical Exam General: Alert and oriented x 3, NAD, deconditioned Cardiovascular: S1 S2 clear, RRR.  Respiratory: Diminished breath sound at the bases, no wheezing Gastrointestinal: Soft, nontender, nondistended, NBS Ext: no pedal edema bilaterally Neuro: no new deficits Psych: Normal affect, deconditioned   Data Reviewed:  I have personally reviewed following labs    CBC Lab Results  Component Value Date   WBC 13.5 (H) 12/02/2022   RBC 2.98 (L) 12/02/2022   HGB 10.3 (L) 12/02/2022   HCT 32.1 (L) 12/02/2022   MCV 107.7 (H) 12/02/2022   MCH 34.6 (H) 12/02/2022   PLT 138 (L) 12/02/2022   MCHC 32.1 12/02/2022   RDW 16.1 (H) 12/02/2022   LYMPHSABS 1.9 11/29/2022   MONOABS 2.0 (H)  11/29/2022   EOSABS 0.4 11/29/2022   BASOSABS 0.1 11/29/2022     Last metabolic panel Lab Results  Component Value  Date   NA 139 12/02/2022   K 3.8 12/02/2022   CL 103 12/02/2022   CO2 20 (L) 12/02/2022   BUN 22 12/02/2022   CREATININE 1.31 (H) 12/02/2022   GLUCOSE 110 (H) 12/02/2022   GFRNONAA 43 (L) 12/02/2022   GFRAA 40 (L) 08/25/2020   CALCIUM 8.9 12/02/2022   PHOS 6.5 (H) 10/04/2020   PROT 6.1 (L) 11/30/2022   ALBUMIN 3.0 (L) 11/30/2022   LABGLOB 2.3 08/07/2022   AGRATIO 1.7 12/21/2020   BILITOT 0.7 11/30/2022   ALKPHOS 48 11/30/2022   AST 41 11/30/2022   ALT 39 11/30/2022   ANIONGAP 16 (H) 12/02/2022    CBG (last 3)  Recent Labs    12/01/22 2109 12/02/22 0748 12/02/22 1119  GLUCAP 156* 103* 182*      Coagulation Profile: Recent Labs  Lab 11/29/22 2255  INR 1.1     Radiology Studies: I have personally reviewed the imaging studies  DG Chest Port 1 View  Result Date: 12/01/2022 CLINICAL DATA:  Sepsis.  Rule out pneumonia. EXAM: PORTABLE CHEST 1 VIEW COMPARISON:  11/29/2022 FINDINGS: There is a left chest wall ICD with lead in the right ventricle. Stable cardiomediastinal contours. No pleural fluid, interstitial edema or airspace disease. Postsurgical change within the cervical spine. Previous right shoulder arthroplasty. IMPRESSION: No acute cardiopulmonary disease. Electronically Signed   By: Signa Kell M.D.   On: 12/01/2022 18:28       Arnella Pralle M.D. [facility] 12/02/2022, 1:20 PM  Available via Epic secure chat 7am-7pm After 7 pm, please refer to night coverage provider listed on amion.

## 2022-12-02 NOTE — Plan of Care (Signed)
  Problem: Education: Goal: Knowledge of cardiac device and self-care will improve Outcome: Progressing Goal: Ability to safely manage health related needs after discharge will improve Outcome: Progressing Goal: Individualized Educational Video(s) Outcome: Progressing   Problem: Cardiac: Goal: Ability to achieve and maintain adequate cardiopulmonary perfusion will improve Outcome: Progressing   Problem: Education: Goal: Ability to describe self-care measures that may prevent or decrease complications (Diabetes Survival Skills Education) will improve Outcome: Progressing Goal: Individualized Educational Video(s) Outcome: Progressing   Problem: Tissue Perfusion: Goal: Adequacy of tissue perfusion will improve Outcome: Progressing   Problem: Skin Integrity: Goal: Risk for impaired skin integrity will decrease Outcome: Progressing   Problem: Nutritional: Goal: Maintenance of adequate nutrition will improve Outcome: Progressing Goal: Progress toward achieving an optimal weight will improve Outcome: Progressing

## 2022-12-03 ENCOUNTER — Telehealth (HOSPITAL_BASED_OUTPATIENT_CLINIC_OR_DEPARTMENT_OTHER): Payer: Self-pay | Admitting: Cardiology

## 2022-12-03 DIAGNOSIS — J189 Pneumonia, unspecified organism: Secondary | ICD-10-CM | POA: Diagnosis not present

## 2022-12-03 DIAGNOSIS — R4182 Altered mental status, unspecified: Secondary | ICD-10-CM | POA: Diagnosis not present

## 2022-12-03 DIAGNOSIS — N1832 Chronic kidney disease, stage 3b: Secondary | ICD-10-CM | POA: Diagnosis not present

## 2022-12-03 DIAGNOSIS — D801 Nonfamilial hypogammaglobulinemia: Secondary | ICD-10-CM | POA: Diagnosis not present

## 2022-12-03 LAB — PROCALCITONIN: Procalcitonin: 2.59 ng/mL

## 2022-12-03 LAB — GLUCOSE, CAPILLARY
Glucose-Capillary: 159 mg/dL — ABNORMAL HIGH (ref 70–99)
Glucose-Capillary: 170 mg/dL — ABNORMAL HIGH (ref 70–99)
Glucose-Capillary: 188 mg/dL — ABNORMAL HIGH (ref 70–99)
Glucose-Capillary: 85 mg/dL (ref 70–99)

## 2022-12-03 MED ORDER — MAGIC MOUTHWASH W/LIDOCAINE
5.0000 mL | Freq: Four times a day (QID) | ORAL | Status: DC
  Administered 2022-12-03 – 2022-12-06 (×5): 5 mL via ORAL
  Filled 2022-12-03 (×14): qty 5

## 2022-12-03 MED ORDER — SALINE SPRAY 0.65 % NA SOLN
1.0000 | NASAL | Status: DC | PRN
  Administered 2022-12-03: 1 via NASAL
  Filled 2022-12-03: qty 44

## 2022-12-03 NOTE — Telephone Encounter (Signed)
Noted, Cardiology will be consulted if needed, follow up can be scheduled once patient is discharged.

## 2022-12-03 NOTE — Evaluation (Signed)
Physical Therapy Evaluation Patient Details Name: Ebony Scott MRN: [id number] DOB: [birth year] Today's Date: 12/03/2022  History of Present Illness  Pt is a 72 yo female admitted with community acquired pneumnonia. PMH: asthma, hypothyroidism, RA, hypoglobulinemia, bronchiectasis, CAD, GERD, chronic systolic CHF with EF 30 to 35%, G1 DD (per echo 07/17/2022)  Clinical Impression  Pt admitted with above diagnosis. Pt from home, ind with rollator for ambulation, has caregiver that assists with showers 1x/week, daughter lives in pt's basement apartment. Pt needing min A for bed mobility and transfers, generally unsteady. Pt declines ambulation due to weakness complaints, tolerates seated exercises without complaints Notified nursing of pt up in recliner and needing bed linen changed. Pt with multiple steps to enter home and lacking 24/7 support, will benefit from continued inpatient follow up therapy, <3 hours/day. Pt currently with functional limitations due to the deficits listed below (see PT Problem List). Pt will benefit from acute skilled PT to increase their independence and safety with mobility to allow discharge.           If plan is discharge home, recommend the following: A little help with walking and/or transfers;A little help with bathing/dressing/bathroom;Assistance with cooking/housework;Assist for transportation;Help with stairs or ramp for entrance   Can travel by private vehicle   Yes    Equipment Recommendations None recommended by PT  Recommendations for Other Services       Functional Status Assessment Patient has had a recent decline in their functional status and demonstrates the ability to make significant improvements in function in a reasonable and predictable amount of time.     Precautions / Restrictions Precautions Precautions: Fall Restrictions Weight Bearing Restrictions: No      Mobility  Bed Mobility Overal bed mobility: Needs Assistance Bed Mobility:  Supine to Sit     Supine to sit: Min assist, HOB elevated, Used rails     General bed mobility comments: min A to upright trunk and slide out to EOB, verbal cues for bedrail use and sequencing    Transfers Overall transfer level: Needs assistance Equipment used: Rolling walker (2 wheels) Transfers: Sit to/from Stand, Bed to chair/wheelchair/BSC Sit to Stand: Min assist   Step pivot transfers: Min assist       General transfer comment: min A to power to stand and steady, verbal cues to push from seated surface and return hands to recliner to assist in controlled return to sitting; min A for step pivot, generally unsteady without LOB or LE buckling, pt declines amb due to weakness complaints    Ambulation/Gait                  Stairs            Wheelchair Mobility     Tilt Bed    Modified Rankin (Stroke Patients Only)       Balance Overall balance assessment: Needs assistance Sitting-balance support: Feet supported, Single extremity supported Sitting balance-Leahy Scale: Fair     Standing balance support: During functional activity, Bilateral upper extremity supported, Reliant on assistive device for balance Standing balance-Leahy Scale: Poor                               Pertinent Vitals/Pain Pain Assessment Pain Assessment: Faces Pain Location: generalized with movement Pain Descriptors / Indicators: Discomfort, Grimacing Pain Intervention(s): Limited activity within patient's tolerance, Monitored during session    Home Living Family/patient expects to be discharged to::  Private residence Living Arrangements: Children (daughter) Available Help at Discharge: Family;Available PRN/intermittently Type of Home: House Home Access: Stairs to enter Entrance Stairs-Rails: Right;Left Entrance Stairs-Number of Steps: 6   Home Layout: Two level;Able to live on main level with bedroom/bathroom Home Equipment: Rollator (4 wheels)       Prior Function Prior Level of Function : Independent/Modified Independent             Mobility Comments: pt reports using rollator walker in the home and community distances ADLs Comments: pt reports caregiver assists with     Extremity/Trunk Assessment   Upper Extremity Assessment Upper Extremity Assessment: Defer to OT evaluation    Lower Extremity Assessment Lower Extremity Assessment: Generalized weakness    Cervical / Trunk Assessment Cervical / Trunk Assessment: Normal  Communication   Communication Communication: No apparent difficulties  Cognition Arousal: Alert Behavior During Therapy: WFL for tasks assessed/performed Overall Cognitive Status: Within Functional Limits for tasks assessed                                          General Comments      Exercises General Exercises - Lower Extremity Long Arc Quad: AROM, Strengthening, Both, 10 reps, Seated Hip Flexion/Marching: AROM, Strengthening, Both, 10 reps, Seated   Assessment/Plan    PT Assessment Patient needs continued PT services  PT Problem List Decreased strength;Decreased activity tolerance;Decreased balance;Decreased mobility;Pain       PT Treatment Interventions DME instruction;Gait training;Stair training;Functional mobility training;Therapeutic activities;Therapeutic exercise;Balance training;Neuromuscular re-education;Patient/family education    PT Goals (Current goals can be found in the Care Plan section)  Acute Rehab PT Goals Patient Stated Goal: regain strength and independence PT Goal Formulation: With patient Time For Goal Achievement: 12/17/22 Potential to Achieve Goals: Good    Frequency Min 1X/week     Co-evaluation               AM-PAC PT "6 Clicks" Mobility  Outcome Measure Help needed turning from your back to your side while in a flat bed without using bedrails?: A Little Help needed moving from lying on your back to sitting on the side of a  flat bed without using bedrails?: A Little Help needed moving to and from a bed to a chair (including a wheelchair)?: A Little Help needed standing up from a chair using your arms (e.g., wheelchair or bedside chair)?: A Little Help needed to walk in hospital room?: A Lot Help needed climbing 3-5 steps with a railing? : Total 6 Click Score: 15    End of Session Equipment Utilized During Treatment: Gait belt Activity Tolerance: Patient tolerated treatment well;Patient limited by fatigue Patient left: in chair;with call bell/phone within reach;with chair alarm set Nurse Communication: Mobility status;Other (comment) (bed linen needs changed) PT Visit Diagnosis: Muscle weakness (generalized) (M62.81);Unsteadiness on feet (R26.81)    Time: 1191-4782 PT Time Calculation (min) (ACUTE ONLY): 23 min   Charges:   PT Evaluation $PT Eval Low Complexity: 1 Low PT Treatments $Therapeutic Activity: 8-22 mins PT General Charges $$ ACUTE PT VISIT: 1 Visit         Tori Kenisha Loftin PT, DPT 12/03/22, 12:20 PM

## 2022-12-03 NOTE — [place]2 (Signed)
[place] MEDICAID FL2 LEVEL OF CARE FORM     IDENTIFICATION  Patient Name: Ebony Scott Birthdate: 08/20/1950 Sex: female Admission Date (Current Location): 11/29/2022  [facility] and [place] Number:  Producer, television/film/video and Address:  [facility],  [address] 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   Sepsis (HCC) 11/30/2022   Fever 09/01/2022   Lethargy 08/31/2022   Hypotension 08/31/2022   DNR (do not resuscitate) 08/31/2022   Abnormal CT of the abdomen 04/26/2022   Itching of ear 04/10/2022   Effusion, left knee 08/09/2021   Noninfectious otitis externa of left ear 06/08/2021   Rash and other nonspecific skin eruption 04/28/2021   Iron deficiency anemia 02/24/2021   GAVE (gastric antral vascular ectasia)    Symptomatic anemia 02/04/2021   Anticoagulated    Antiplatelet or antithrombotic long-term use    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   Coronary artery calcification seen on CAT scan 01/11/2021   Acute kidney injury superimposed on chronic kidney disease (HCC) 10/04/2020   Physical deconditioning 09/25/2020   History of DVT (deep vein thrombosis) 09/24/2020   DMII (diabetes mellitus, type 2) (HCC) 09/23/2020   Cellulitis of right hand 09/22/2020   Abscess of dorsum of right hand 09/22/2020   Hoarseness 09/09/2020   Metabolic encephalopathy 02/24/2020   Acute encephalopathy    AMS (altered mental status) 02/23/2020   S/P reverse total shoulder  arthroplasty, right    Hypokalemia    DOE (dyspnea on exertion) 02/18/2020   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   S/P reverse total shoulder arthroplasty, left 02/16/2020   Preoperative clearance 01/26/2020   Closed displaced fracture of metatarsal bone of right foot 01/20/2019   History of pulmonary embolus (PE) 12/19/2018   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   Rheumatoid arthritis (HCC)    Bilateral impacted cerumen 08/18/2018   Fungal otitis externa 08/18/2018   Cerumen debris on tympanic membrane of right ear 08/18/2018   Asthmatic bronchitis with exacerbation 07/29/2018   Pulmonary embolism (HCC) 07/29/2018   Cellulitis of forearm    Fever of unknown origin (FUO) 02/10/2018   Diarrhea 02/10/2018   Abdominal pain 02/10/2018   Fracture of base of fifth metatarsal bone with routine healing, left    Closed fracture of fifth metatarsal bone of right foot, initial encounter 01/15/2018   DVT (deep venous thrombosis) (HCC) 01/14/2018   Hypothyroidism 01/14/2018   Depression 01/14/2018   Debility    Protein-calorie malnutrition, moderate (HCC) 12/17/2017   Knee pain    History of breast cancer    Chronic pain syndrome    Fibromyalgia    Graves disease    Vasculitis (HCC) 12/13/2017   Rhabdomyolysis 12/13/2017   Dysphonia 08/10/2017   Pulmonary nodules    Chronic kidney disease, stage 3b (HCC) 04/15/2016   Hx of adenomatous colonic polyps 04/12/2016   Cough 02/09/2016   Opacity of lung on imaging study 11/03/2015   Severe persistent asthma 02/08/2015   Facial pain syndrome 02/08/2015   Insomnia 02/08/2015  Other allergic rhinitis 02/08/2015   LPRD (laryngopharyngeal reflux disease) 02/08/2015   Chronic systolic CHF (congestive heart failure) (HCC) 02/08/2015   Diastolic dysfunction 02/08/2015   Benign paroxysmal positional vertigo 12/03/2012   Dizziness and giddiness 10/29/2012   Disequilibrium 10/29/2012   Pseudogout 02/24/2012   Irritable bowel syndrome  01/02/2010   Hyperlipidemia 12/23/2009   Hyperthyroidism 11/12/2006   Seasonal and perennial allergic rhinitis 11/12/2006   GERD 11/12/2006   NECK PAIN, CHRONIC 11/12/2006   OSTEOPOROSIS 11/12/2006   BREAST CANCER, HX OF 11/12/2006    Orientation RESPIRATION BLADDER Height & Weight     Self, Situation, Place  Normal Incontinent Weight: 134 lb 11.2 oz (61.1 kg) Height:  5' (152.4 cm)  BEHAVIORAL SYMPTOMS/MOOD NEUROLOGICAL BOWEL NUTRITION STATUS      Incontinent Diet (See discharge summary)  AMBULATORY STATUS COMMUNICATION OF NEEDS Skin   Limited Assist Verbally Normal                       Personal Care Assistance Level of Assistance  Bathing, Feeding, Dressing Bathing Assistance: Limited assistance Feeding assistance: Independent Dressing Assistance: Limited assistance     Functional Limitations Info  Sight, Hearing, Speech Sight Info: Impaired Hearing Info: Impaired Speech Info: Adequate    SPECIAL CARE FACTORS FREQUENCY  PT (By licensed PT), OT (By licensed OT)     PT Frequency: 5x/wk OT Frequency: 5x/wk            Contractures Contractures Info: Not present    Additional Factors Info  Code Status, Allergies Code Status Info: DNR Allergies Info: Baclofen, Dust Mite Extract, Molds & Smuts, Morphine And Codeine, Other, Penicillins, Rofecoxib, Shellfish Allergy, Shrimp Extract, Tetracycline Hcl, Tetracycline Hcl, Xolair (Omalizumab), Zoledronic Acid, Dilaudid (Hydromorphone Hcl), Hydrocodone-acetaminophen, Levofloxacin, Oxycodone Hcl, Paroxetine, Celecoxib, Diltiazem Hcl, Lactose, Oxycodone-acetaminophen, Pork-derived Products, Rituximab, Tree Extract, Gamunex (Immune Globulin), Penicillin G Procaine           Current Medications (12/03/2022):  This is the current hospital active medication list Current Facility-Administered Medications  Medication Dose Route Frequency Provider Last Rate Last Admin   acetaminophen (TYLENOL) tablet 650 mg  650 mg Oral Q6H  PRN Hillary Bow, DO   650 mg at 12/03/22 1450   allopurinol (ZYLOPRIM) tablet 100 mg  100 mg Oral Daily Lyda Perone M, DO   100 mg at 12/03/22 0858   atorvastatin (LIPITOR) tablet 80 mg  80 mg Oral Daily Rai, Ripudeep K, MD   80 mg at 12/03/22 0857   azithromycin (ZITHROMAX) 500 mg in sodium chloride 0.9 % 250 mL IVPB  500 mg Intravenous QHS Hillary Bow, DO   Stopped at 12/02/22 2235   benzonatate (TESSALON) capsule 200 mg  200 mg Oral BID PRN Hillary Bow, DO   200 mg at 12/02/22 2141   budesonide (PULMICORT) nebulizer solution 0.5 mg  0.5 mg Nebulization BID Rai, Ripudeep K, MD   0.5 mg at 12/03/22 0829   cefTRIAXone (ROCEPHIN) 2 g in sodium chloride 0.9 % 100 mL IVPB  2 g Intravenous Q24H Rancour, Jeannett Senior, MD 200 mL/hr at 12/03/22 0018 2 g at 12/03/22 0018   clopidogrel (PLAVIX) tablet 75 mg  75 mg Oral Daily Lyda Perone M, DO   75 mg at 12/03/22 2376   diclofenac Sodium (VOLTAREN) 1 % topical gel 2 g  2 g Topical TID PRN Steffanie Rainwater, MD       empagliflozin (JARDIANCE) tablet 10 mg  10 mg Oral QAC breakfast Lyda Perone  M, DO   10 mg at 12/03/22 0857   feeding supplement (ENSURE ENLIVE / ENSURE PLUS) liquid 237 mL  237 mL Oral BID BM Rai, Ripudeep K, MD   237 mL at 12/03/22 0900   gabapentin (NEURONTIN) capsule 300 mg  300 mg Oral QHS Lyda Perone M, DO   300 mg at 12/02/22 2137   guaiFENesin-dextromethorphan (ROBITUSSIN DM) 100-10 MG/5ML syrup 5 mL  5 mL Oral Q4H PRN Rai, Ripudeep K, MD   5 mL at 12/03/22 0858   hydrALAZINE (APRESOLINE) injection 10 mg  10 mg Intravenous Q4H PRN Rai, Ripudeep K, MD       insulin aspart (novoLOG) injection 0-5 Units  0-5 Units Subcutaneous QHS Rai, Ripudeep K, MD       insulin aspart (novoLOG) injection 0-6 Units  0-6 Units Subcutaneous TID WC Rai, Ripudeep K, MD   1 Units at 12/03/22 1226   ipratropium-albuterol (DUONEB) 0.5-2.5 (3) MG/3ML nebulizer solution 3 mL  3 mL Nebulization Q6H PRN Hillary Bow, DO        ipratropium-albuterol (DUONEB) 0.5-2.5 (3) MG/3ML nebulizer solution 3 mL  3 mL Nebulization BID Rai, Ripudeep K, MD   3 mL at 12/03/22 0829   isosorbide-hydrALAZINE (BIDIL) 20-37.5 MG per tablet 1 tablet  1 tablet Oral BID Rai, Ripudeep K, MD   1 tablet at 12/03/22 0858   lip balm (CARMEX) ointment 1 Application  1 Application Topical PRN Rai, Ripudeep K, MD   1 Application at 12/01/22 1943   magic mouthwash w/lidocaine  5 mL Oral QID Rai, Ripudeep K, MD       Oral care mouth rinse  15 mL Mouth Rinse PRN Rai, Ripudeep K, MD       predniSONE (DELTASONE) tablet 7 mg  7 mg Oral Q breakfast Lyda Perone M, DO   7 mg at 12/03/22 6295   rivaroxaban (XARELTO) tablet 20 mg  20 mg Oral Q supper Hillary Bow, DO   20 mg at 12/02/22 1728   sacubitril-valsartan (ENTRESTO) 24-26 mg per tablet  1 tablet Oral BID Rai, Ripudeep K, MD   1 tablet at 12/03/22 0858   sodium chloride (OCEAN) 0.65 % nasal spray 1 spray  1 spray Each Nare PRN Gunnar Fusi, RN       traMADol Janean Sark) tablet 50 mg  50 mg Oral QHS Lyda Perone M, DO   50 mg at 12/02/22 2137   traZODone (DESYREL) tablet 50 mg  50 mg Oral QHS Lyda Perone M, DO   50 mg at 12/02/22 2137   Facility-Administered Medications Ordered in Other Encounters  Medication Dose Route Frequency Provider Last Rate Last Admin   iohexol (OMNIPAQUE) 350 MG/ML injection    PRN Marykay Lex, MD   105 mL at 01/11/21 0955     Discharge Medications: Please see discharge summary for a list of discharge medications.  Relevant Imaging Results:  Relevant Lab Results:   Additional Information SSN: [id number] [place], LCSW

## 2022-12-03 NOTE — Progress Notes (Signed)
[facility]                                                                              Ebony Scott, is a 72 y.o. female, DOB - Dec 23, 1950, [id number] Admit date - 11/29/2022    Outpatient Primary MD for the patient is [name], [name], MD  LOS - 3  days  Chief Complaint  Patient presents with   Weakness       Brief summary   Patient is a 72 year old female with asthma, hypothyroidism, RA, hypoglobulinemia, bronchiectasis, CAD, GERD, chronic systolic CHF with EF 30 to 35%, G1 DD (per echo 07/17/2022) presented to ED with feeling ill for last 2 to 3 days.  Patient was found down at home, confused, oriented to person and place.  No report of chest pain, shortness of breath or dysuria however did have cough during encounter. Patient was febrile 88, temp of 102.6 F.  RR 17, heart rate 89, BP 155/66 O2 sats 93% on room air Hemoglobin 13.3, WBCs 14.7 CT abdomen showed patchy opacities in the lower lungs with small right pleural effusion, concerning for pneumonia  Assessment & Plan    Principal Problem: Sepsis, POA due to CAP (community acquired pneumonia) -Presented with fevers, leukocytosis, source likely due to pneumonia.  Procalcitonin 0.19 -Urine strep antigen negative, urine Legionella antigen negative -Sepsis physiology resolving.  Blood cultures negative so far -COVID-19, flu, RSV negative, respiratory virus panel negative -Continue IV Zithromax, Rocephin  Active Problems:   Acute metabolic encephalopathy, generalized debility -Likely due to to #1, CT head negative for acute CVA -Per daughter, patient does get delirious when she has fever or infection -Sitting up in the chair, mental status improving -SLP evaluation recommended dysphagia 3 diet mechanical soft with thin liquids -PT evaluation recommended SNF    Rheumatoid arthritis (HCC) -Continue prednisone, not on DMARDs likely due to recurrent infections    Hypogammaglobulinemia (HCC) -Hold  Hizentra for now.    Prior history of DVT/PE, antiplatelet or antithrombotic long-term use -Continue Xarelto    Chronic systolic CHF (congestive heart failure) (HCC) -Continue BiDil, Entresto  -Creatinine at baseline    Chronic kidney disease, stage 3b (HCC) -Baseline creatinine 1.3-1.5. -Creatinine still at baseline    DMII (diabetes mellitus, type 2) (HCC) -Hemoglobin A1c 6.2 on 09/11/2022 -CBG (last 3)  Recent Labs    12/03/22 0736 12/03/22 1216 12/03/22 1622  GLUCAP 85 170* 188*   -Continue sliding scale insulin  Estimated body mass index is 26.31 kg/m as calculated from the following:   Height as of this encounter: 5' (1.524 m).   Weight as of this encounter: 61.1 kg.  Code Status: DNR DVT Prophylaxis:   rivaroxaban (XARELTO) tablet 20 mg   Level of Care: Level of care: Med-Surg Family Communication: Updated patient's daughter, Lyli Stracener on the phone on 10/13  Disposition Plan:      Remains inpatient appropriate: PT evaluation recommended SNF   Procedures:    Consultants:     Antimicrobials:   Anti-infectives (From admission, onward)    Start     Dose/Rate Route Frequency Ordered Stop   11/30/22 2200  azithromycin (ZITHROMAX)  500 mg in sodium chloride 0.9 % 250 mL IVPB        500 mg 250 mL/hr over 60 Minutes Intravenous Daily at bedtime 11/30/22 0552 12/04/22 2159   11/30/22 0130  azithromycin (ZITHROMAX) 500 mg in sodium chloride 0.9 % 250 mL IVPB        500 mg 250 mL/hr over 60 Minutes Intravenous  Once 11/30/22 0115 11/30/22 0309   11/29/22 2315  cefTRIAXone (ROCEPHIN) 2 g in sodium chloride 0.9 % 100 mL IVPB        2 g 200 mL/hr over 30 Minutes Intravenous Every 24 hours 11/29/22 2309 12/06/22 2314          Medications  allopurinol  100 mg Oral Daily   atorvastatin  80 mg Oral Daily   budesonide  0.5 mg Nebulization BID   clopidogrel  75 mg Oral Daily   empagliflozin  10 mg Oral QAC breakfast   feeding supplement  237 mL Oral BID BM    gabapentin  300 mg Oral QHS   insulin aspart  0-5 Units Subcutaneous QHS   insulin aspart  0-6 Units Subcutaneous TID WC   ipratropium-albuterol  3 mL Nebulization BID   isosorbide-hydrALAZINE  1 tablet Oral BID   magic mouthwash w/lidocaine  5 mL Oral QID   predniSONE  7 mg Oral Q breakfast   rivaroxaban  20 mg Oral Q supper   sacubitril-valsartan  1 tablet Oral BID   traMADol  50 mg Oral QHS   traZODone  50 mg Oral QHS      Subjective:   Marylynn Sims was seen and examined today.  Alert and oriented, sitting up in the chair, still feeling weak.  No acute complaints.  No fevers today.    Objective:   Vitals:   12/02/22 2114 12/03/22 0448 12/03/22 0829 12/03/22 1220  BP:  139/67  (!) 104/56  Pulse:  92  93  Resp:    18  Temp:  99.2 F (37.3 C)  98.7 F (37.1 C)  TempSrc:  Oral  Oral  SpO2: 96% 95% 94% 93%  Weight:      Height:        Intake/Output Summary (Last 24 hours) at 12/03/2022 1632 Last data filed at 12/03/2022 0900 Gross per 24 hour  Intake 818 ml  Output 450 ml  Net 368 ml     Wt Readings from Last 3 Encounters:  11/30/22 61.1 kg  11/22/22 57.6 kg  09/11/22 57.6 kg   Physical Exam General: Alert and oriented x 3, NAD, sitting up in the chair Cardiovascular: S1 S2 clear, RRR.  Respiratory: CTAB, no wheezing Gastrointestinal: Soft, nontender, nondistended, NBS Ext: no pedal edema bilaterally Neuro: no new deficits Psych: Normal affect, feels deconditioned   Data Reviewed:  I have personally reviewed following labs    CBC Lab Results  Component Value Date   WBC 13.5 (H) 12/02/2022   RBC 2.98 (L) 12/02/2022   HGB 10.3 (L) 12/02/2022   HCT 32.1 (L) 12/02/2022   MCV 107.7 (H) 12/02/2022   MCH 34.6 (H) 12/02/2022   PLT 138 (L) 12/02/2022   MCHC 32.1 12/02/2022   RDW 16.1 (H) 12/02/2022   LYMPHSABS 1.9 11/29/2022   MONOABS 2.0 (H) 11/29/2022   EOSABS 0.4 11/29/2022   BASOSABS 0.1 11/29/2022     Last metabolic panel Lab Results   Component Value Date   NA 139 12/02/2022   K 3.8 12/02/2022   CL 103 12/02/2022   CO2 20 (  L) 12/02/2022   BUN 22 12/02/2022   CREATININE 1.31 (H) 12/02/2022   GLUCOSE 110 (H) 12/02/2022   GFRNONAA 43 (L) 12/02/2022   GFRAA 40 (L) 08/25/2020   CALCIUM 8.9 12/02/2022   PHOS 6.5 (H) 10/04/2020   PROT 6.1 (L) 11/30/2022   ALBUMIN 3.0 (L) 11/30/2022   LABGLOB 2.3 08/07/2022   AGRATIO 1.7 12/21/2020   BILITOT 0.7 11/30/2022   ALKPHOS 48 11/30/2022   AST 41 11/30/2022   ALT 39 11/30/2022   ANIONGAP 16 (H) 12/02/2022    CBG (last 3)  Recent Labs    12/03/22 0736 12/03/22 1216 12/03/22 1622  GLUCAP 85 170* 188*      Coagulation Profile: Recent Labs  Lab 11/29/22 2255  INR 1.1     Radiology Studies: I have personally reviewed the imaging studies  DG Chest Port 1 View  Result Date: 12/01/2022 CLINICAL DATA:  Sepsis.  Rule out pneumonia. EXAM: PORTABLE CHEST 1 VIEW COMPARISON:  11/29/2022 FINDINGS: There is a left chest wall ICD with lead in the right ventricle. Stable cardiomediastinal contours. No pleural fluid, interstitial edema or airspace disease. Postsurgical change within the cervical spine. Previous right shoulder arthroplasty. IMPRESSION: No acute cardiopulmonary disease. Electronically Signed   By: Signa Kell M.D.   On: 12/01/2022 18:28       Kien Mirsky M.D. [facility] 12/03/2022, 4:32 PM  Available via Epic secure chat 7am-7pm After 7 pm, please refer to night coverage provider listed on amion.

## 2022-12-03 NOTE — Evaluation (Signed)
LATE ENTRY FOR EVALUATION COMPLETED 11/30/2022:   Clinical/Bedside Swallow Evaluation Patient Details  Name: Ebony Scott MRN: [id number] Date of Birth: 11/16/1950  Today's Date: 12/03/2022 Time: SLP Start Time (ACUTE ONLY): 1116 SLP Stop Time (ACUTE ONLY): 1150 SLP Time Calculation (min) (ACUTE ONLY): 34 min  Past Medical History:  Past Medical History:  Diagnosis Date   Anemia    Angio-edema    Breast cancer (HCC) 1998   Left breast, in remission   Bronchiectasis (HCC)    CHF (congestive heart failure) (HCC)    Clostridium difficile colitis 01/14/2018   Coronary artery disease    Depression    "some; don't take anything for it" (02/20/2012), pt denies dx as of 06/02/21   Diverticulosis    DVT (deep venous thrombosis) (HCC)    Fibromyalgia 11/2011   GAVE (gastric antral vascular ectasia)    GERD (gastroesophageal reflux disease)    Headache(784.0)    "related to allergies; more at different times during the year" (02/20/2012)   Hemorrhoids    Hiatal hernia    back and neck   History of blood transfusion 2023   1 unit, 5 units of Iron   Hx of adenomatous colonic polyps 04/12/2016   Hypercholesteremia    good cholesterol is high   Hypothyroidism    h/o Graves disease   IBS (irritable bowel syndrome)    Moderate persistent asthma    -FeV1 72% 2011, -IgE 102 2011, CT sinus Neg 2011   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 Casimer Lanius   Sleep apnea    uses cpap nightly   Tracheobronchomalacia    Past Surgical History:  Past Surgical History:  Procedure Laterality Date   ABDOMINAL HYSTERECTOMY N/A    Phreesia 10/21/2019   ANTERIOR AND POSTERIOR REPAIR  1990's   APPENDECTOMY     ARGON LASER APPLICATION  02/05/2021   Procedure: ARGON LASER APPLICATION;  Surgeon: Benancio Deeds, MD;  Location: [facility]  END[facility];  Service: Gastroenterology;;   BREAST LUMPECTOMY  1998   left   BREAST SURGERY N/A    Phreesia 10/21/2019   BRONCHIAL WASHINGS  04/05/2020   Procedure: BRONCHIAL WASHINGS;  Surgeon: Martina Sinner, MD;  Location: [facility];  Service: Pulmonary;;   CAPSULOTOMY Right 08/28/2021   Procedure: CAPSULOTOMY;  Surgeon: Candelaria Stagers, DPM;  Location: [facility] OR;  Service: Podiatry;  Laterality: Right;   CARPOMETACARPEL (CMC) FUSION OF THUMB WITH AUTOGRAFT FROM RADIUS  ~ 2009   "both thumbs" (02/20/2012)   CATARACT EXTRACTION W/ INTRAOCULAR LENS  IMPLANT, BILATERAL  2012   CERVICAL DISCECTOMY  10/2001   C5-C6   CERVICAL FUSION  2003   C3-C4   CHOLECYSTECTOMY     COLONOSCOPY     CORONARY STENT INTERVENTION N/A 01/11/2021   Procedure: CORONARY STENT INTERVENTION;  Surgeon: Marykay Lex, MD;  Location: [facility] INVASIVE CV LAB;  Service: Cardiovascular;  Laterality: N/A;   DEBRIDEMENT TENNIS ELBOW  ?1970's   right   ESOPHAGOGASTRODUODENOSCOPY     ESOPHAGOGASTRODUODENOSCOPY (EGD) WITH PROPOFOL N/A 02/05/2021   Procedure: ESOPHAGOGASTRODUODENOSCOPY (EGD) WITH PROPOFOL;  Surgeon: Benancio Deeds, MD;  Location: [facility];  Service: Gastroenterology;  Laterality: N/A;   EYE SURGERY N/A    Phreesia 10/21/2019   HAMMER TOE SURGERY Right 08/28/2021   Procedure: HAMMER TOE REPAIR 2ND TOE  RIGHT FOOT;  Surgeon: Candelaria Stagers, DPM;  Location: [facility];  Service: Podiatry;  Laterality: Right;   HYSTERECTOMY     I & D EXTREMITY Right 09/22/2020   Procedure: IRRIGATION AND DEBRIDEMENT RIGHT HAND AND ELBOW;  Surgeon: Teryl Lucy, MD;  Location: [facility] ORS;  Service: Orthopedics;  Laterality: Right;   ICD IMPLANT N/A 08/27/2022   Procedure: ICD IMPLANT;  Surgeon: Maurice Small, MD;  Location: [facility] INVAS[facility] CV LAB;  Service: Cardiovascular;  Laterality: N/A;   KNEE ARTHROPLASTY  ?1990's   "?right; w/cartilage repair" (02/20/2012)   MASS EXCISION Left 08/28/2021   Procedure: EXCISION OF SOFT TISSUE   MASS LEFT FOOT;  Surgeon: Candelaria Stagers, DPM;  Location: [facility];  Service: Podiatry;  Laterality: Left;   NASAL SEPTUM SURGERY  1980's   POSTERIOR CERVICAL FUSION/FORAMINOTOMY  2004   "failed initial fusion; rewired  anterior neck" (02/20/2012)   REVERSE SHOULDER ARTHROPLASTY Right 02/16/2020   Procedure: REVERSE SHOULDER ARTHROPLASTY;  Surgeon: Teryl Lucy, MD;  Location: [facility];  Service: Orthopedics;  Laterality: Right;   RIGHT/LEFT HEART CATH AND CORONARY ANGIOGRAPHY N/A 01/11/2021   Procedure: RIGHT/LEFT HEART CATH AND CORONARY ANGIOGRAPHY;  Surgeon: Marykay Lex, MD;  Location: [facility] INVASIVE CV LAB;  Service: Cardiovascular;  Laterality: N/A;   SPINE SURGERY N/A    Phreesia 10/21/2019   TONSILLECTOMY  ~ 1953   VESICOVAGINAL FISTULA CLOSURE W/ TAH  1988   VIDEO BRONCHOSCOPY Bilateral 08/23/2016   Procedure: VIDEO BRONCHOSCOPY WITH FLUORO;  Surgeon: Roslynn Amble, MD;  Location: [facility];  Service: Cardiopulmonary;  Laterality: Bilateral;   VIDEO BRONCHOSCOPY N/A 04/05/2020   Procedure: VIDEO BRONCHOSCOPY WITHOUT FLUORO;  Surgeon: Martina Sinner, MD;  Location: [facility];  Service: Pulmonary;  Laterality: N/A;   HPI:  Ebony Scott is a 71 y.o. female with medical history significant of asthma, hypothyroidism, RA, breast cancer, hypogammaglobulinemia.  Pt feeling ill for 2-3 days.  Pt with confusion and AMS, brought in by EMS. H/o HH, tracheomalacia,  PSH endoscopy, cervical spine surgeries. 09/20/2014 MBS  Pt demonstrates a mild structural oropharyngeal dysphagia resulting from presence of cervical hardware narrowing pharyngeal cavity and impeding epiglottic deflection, slight penetration of thin clear with subsequent swallows.  No source of nasal regurg of solids noted. CXR 10/10 1. Low lung volumes. No acute chest findings.  2. Chronic elevation of right hemidiaphragm.    Assessment / Plan / Recommendation  Clinical Impression  From limited po pt consumed, she presents with  overall functional oropharyngeal swallow ability. She is generally weak (weak voice and cough) and sleepy but does follow some directions and assists to feed herself.  No focal CN deficits noted.  Lingual surface appeared slightly coated white - and did not fully clear with dental brushing. Daughter reports pt gets candidiasis every time she gets on ABX, she is also immunocompromised due to her RA.  No clinical indication of aspiration with few bites/sips pt would accept *applejuice x2 ounces, water 2 sips, few bites of applesauce and single bite of graham cracker.  Pt noted to fall asleep with food bolus in her mouth and needed cues to swallow. Cough observed x1 with expectoration of yellow viscous secretions and did not include boluses she had swallowed.  Recommend dys3/thin with strict precautions including having pt feed herself.   Prior MBS 2016 showed mild structural oropharyngeal dysphagia from presence of cervical hardward impeding epiglottic deflection allowing penetration of thin that cleared with subsequent swallows.  Will follow up briefly  given premorbid cough with po per daughter and known dysphagia from 2016. SLP Visit Diagnosis: Dysphagia, unspecified (R13.10);Dysphagia, oropharyngeal phase (R13.12)    Aspiration Risk  Mild aspiration risk    Diet Recommendation Dysphagia 3 (Mech soft);Thin liquid    Medication Administration: Whole meds with liquid Supervision: Patient able to self feed Compensations: Minimize environmental distractions;Slow rate;Small sips/bites    Other  Recommendations Oral Care Recommendations: Oral care BID    Recommendations for follow up therapy are one component of a multi-disciplinary discharge planning process, led by the attending physician.  Recommendations may be updated based on patient status, additional functional criteria and insurance authorization.  Follow up Recommendations Follow physician's recommendations for discharge plan and follow up  therapies      Assistance Recommended at Discharge    Functional Status Assessment Patient has had a recent decline in their functional status and demonstrates the ability to make significant improvements in function in a reasonable and predictable amount of time.  Frequency and Duration min 1 x/week  1 week       Prognosis Prognosis for improved oropharyngeal function: Fair Barriers to Reach Goals: Time post onset      Swallow Study   General Date of Onset: 11/30/22 HPI: Ebony Scott is a 72 y.o. female with medical history significant of asthma, hypothyroidism, RA, breast cancer, hypogammaglobulinemia.  Pt feeling ill for 2-3 days.  Pt with confusion and AMS, brought in by EMS. H/o HH, tracheomalacia,  PSH endoscopy, cervical spine surgeries. 09/20/2014 MBS  Pt demonstrates a mild structural oropharyngeal dysphagia resulting from presence of cervical hardware narrowing pharyngeal cavity and impeding epiglottic deflection, slight penetration of thin clear with subsequent swallows.  No source of nasal regurg of solids noted. CXR 10/10 1. Low lung volumes. No acute chest findings.  2. Chronic elevation of right hemidiaphragm. Type of Study: Bedside Swallow Evaluation Previous Swallow Assessment: see HPI Diet Prior to this Study: NPO Temperature Spikes Noted: Yes Respiratory Status: Room air History of Recent Intubation: No Behavior/Cognition: Lethargic/Drowsy;Distractible;Requires cueing Oral Cavity Assessment: Dry Oral Care Completed by SLP: Yes Oral Cavity - Dentition: Adequate natural dentition Vision: Functional for self-feeding Self-Feeding Abilities: Able to feed self Patient Positioning: Upright in bed Baseline Vocal Quality: Low vocal intensity Volitional Cough: Weak Volitional Swallow: Unable to elicit    Oral/Motor/Sensory Function     Ice Chips Ice chips: Not tested   Thin Liquid Thin Liquid: Within functional limits Presentation: Straw;Self Fed    Nectar Thick Nectar Thick  Liquid: Not tested   Honey Thick Honey Thick Liquid: Not tested   Puree Puree: Within functional limits Presentation: Self Fed;Spoon   Solid       Ebony Infante, MS [facility] SLP Acute Rehab Services Office [phone] Solid: Impaired Presentation: Self Fed Oral Phase Impairments: Other (comment) Oral Phase Functional Implications: Prolonged oral transit;Impaired mastication

## 2022-12-03 NOTE — Progress Notes (Signed)
Speech Language Pathology Treatment: Dysphagia  Patient Details Name: Ebony Scott MRN: [id number] DOB: [birth year] Today's Date: 12/03/2022 Time: 4098-1191 SLP Time Calculation (min) (ACUTE ONLY): 15 min  Assessment / Plan / Recommendation Clinical Impression  Patient seen by SLP for skilled intervention focused on diet toleration. Patient was awake, alert, and pleasant throughout the session. Patient reported no difficulty or pain associated with swallowing and no changes in swallow function as compared to her baseline. She stated that she has a chronic cough that occasionally worsens after eating or drinking. Patient was directly observed with thin liquids via straw and regular solid food. No overt s/sx of dysphagia were noted at this time. Patient may continue with regular diet and thin liquids. No further ST is indicated at this time.   HPI HPI: Ebony Scott is a 72 y.o. female with medical history significant of asthma, hypothyroidism, RA, breast cancer, hypogammaglobulinemia.  Pt feeling ill for 2-3 days.  Pt with confusion and AMS, brought in by EMS. H/o HH, tracheomalacia,  PSH endoscopy, cervical spine surgeries. 09/20/2014 MBS  Pt demonstrates a mild structural oropharyngeal dysphagia resulting from presence of cervical hardware narrowing pharyngeal cavity and impeding epiglottic deflection, slight penetration of thin clear with subsequent swallows.  No source of nasal regurg of solids noted. CXR 10/10 1. Low lung volumes. No acute chest findings.  2. Chronic elevation of right hemidiaphragm.      SLP Plan  Discharge SLP treatment due to (comment) (no further s/sx dysphagia)      Recommendations for follow up therapy are one component of a multi-disciplinary discharge planning process, led by the attending physician.  Recommendations may be updated based on patient status, additional functional criteria and insurance authorization.    Recommendations  Diet recommendations: Regular;Thin  liquid Medication Administration: Whole meds with liquid Supervision: Patient able to self feed Compensations: Minimize environmental distractions;Slow rate;Small sips/bites Postural Changes and/or Swallow Maneuvers: Seated upright 90 degrees;Upright 30-60 min after meal                  Oral care BID   None Dysphagia, unspecified (R13.10);Dysphagia, oropharyngeal phase (R13.12)     Discharge SLP treatment due to (comment) (no further s/sx dysphagia)     Ebony Scott, B.S., Speech Therapy Student    12/03/2022, 12:44 PM

## 2022-12-03 NOTE — Plan of Care (Signed)
  Problem: Education: Goal: Knowledge of cardiac device and self-care will improve Outcome: Progressing Goal: Ability to safely manage health related needs after discharge will improve Outcome: Progressing Goal: Individualized Educational Video(s) Outcome: Progressing   Problem: Cardiac: Goal: Ability to achieve and maintain adequate cardiopulmonary perfusion will improve Outcome: Progressing   Problem: Education: Goal: Ability to describe self-care measures that may prevent or decrease complications (Diabetes Survival Skills Education) will improve Outcome: Progressing Goal: Individualized Educational Video(s) Outcome: Progressing   Problem: Coping: Goal: Ability to adjust to condition or change in health will improve Outcome: Progressing   Problem: Fluid Volume: Goal: Ability to maintain a balanced intake and output will improve Outcome: Progressing   Problem: Health Behavior/Discharge Planning: Goal: Ability to identify and utilize available resources and services will improve Outcome: Progressing Goal: Ability to manage health-related needs will improve Outcome: Progressing   Problem: Metabolic: Goal: Ability to maintain appropriate glucose levels will improve Outcome: Progressing   Problem: Nutritional: Goal: Maintenance of adequate nutrition will improve Outcome: Progressing Goal: Progress toward achieving an optimal weight will improve Outcome: Progressing   Problem: Skin Integrity: Goal: Risk for impaired skin integrity will decrease Outcome: Progressing   Problem: Tissue Perfusion: Goal: Adequacy of tissue perfusion will improve Outcome: Progressing   Problem: Education: Goal: Knowledge of General Education information will improve Description: Including pain rating scale, medication(s)/side effects and non-pharmacologic comfort measures Outcome: Progressing   Problem: Health Behavior/Discharge Planning: Goal: Ability to manage health-related needs  will improve Outcome: Progressing   Problem: Clinical Measurements: Goal: Ability to maintain clinical measurements within normal limits will improve Outcome: Progressing Goal: Will remain free from infection Outcome: Progressing Goal: Diagnostic test results will improve Outcome: Progressing Goal: Respiratory complications will improve Outcome: Progressing Goal: Cardiovascular complication will be avoided Outcome: Progressing   Problem: Activity: Goal: Risk for activity intolerance will decrease Outcome: Progressing   Problem: Nutrition: Goal: Adequate nutrition will be maintained Outcome: Progressing   Problem: Coping: Goal: Level of anxiety will decrease Outcome: Progressing   Problem: Elimination: Goal: Will not experience complications related to bowel motility Outcome: Progressing Goal: Will not experience complications related to urinary retention Outcome: Progressing   Problem: Pain Managment: Goal: General experience of comfort will improve Outcome: Progressing   Problem: Safety: Goal: Ability to remain free from injury will improve Outcome: Progressing   Problem: Skin Integrity: Goal: Risk for impaired skin integrity will decrease Outcome: Progressing

## 2022-12-03 NOTE — TOC Initial Note (Signed)
Transition of Care [facility]) - Initial/Assessment Note    Patient Details  Name: Ebony Scott MRN: [id number] Date of Birth: 1950-11-06  Transition of Care [facility]) CM/SW Contact:    Otelia Santee, LCSW Phone Number: 12/03/2022, 3:07 PM  Clinical Narrative:                 Met with pt to discuss SNF placement. Pt is agreeable to SNF and shares she has been to SNF in the past but, is unsure of what facility she went to. Pt shares that she would like CSW to discuss discharge planning with her daughter, Ebony Scott. CSW attempted to speak w/ daughter however, phone went to VM. Unable to leave VM as voicemail box was full.  Referrals have been sent out for SNF placement.  Expected Discharge Plan: Skilled Nursing Facility Barriers to Discharge: Continued Medical Work up, SNF Pending bed offer   Patient Goals and CMS Choice Patient states their goals for this hospitalization and ongoing recovery are:: To go to rehab          Expected Discharge Plan and Services In-house Referral: Clinical Social Work Discharge Planning Services: NA Post Acute Care Choice: Skilled Nursing Facility Living arrangements for the past 2 months: Single Family Home                 DME Arranged: N/A DME Agency: NA                  Prior Living Arrangements/Services Living arrangements for the past 2 months: Single Family Home Lives with:: Adult Children Patient language and need for interpreter reviewed:: Yes Do you feel safe going back to the place where you live?: Yes      Need for Family Participation in Patient Care: Yes (Comment) Care giver support system in place?: Yes (comment) Current home services: DME ([employer]) Criminal Activity/Legal Involvement Pertinent to Current Situation/Hospitalization: No - Comment as needed  Activities of Daily Living   ADL Screening (condition at time of admission) Independently performs ADLs?: No Does the patient have a NEW difficulty with  bathing/dressing/toileting/self-feeding that is expected to last >3 days?: No Does the patient have a NEW difficulty with getting in/out of bed, walking, or climbing stairs that is expected to last >3 days?: No Does the patient have a NEW difficulty with communication that is expected to last >3 days?: No Is the patient deaf or have difficulty hearing?: Yes Does the patient have difficulty seeing, even when wearing glasses/contacts?: No Does the patient have difficulty concentrating, remembering, or making decisions?: No  Permission Sought/Granted Permission sought to share information with : Facility Medical sales representative, Family Supports Permission granted to share information with : Yes, Verbal Permission Granted  Share Information with NAME: Ebony Scott  Permission granted to share info w AGENCY: SNF's  Permission granted to share info w Relationship: Daughter     Emotional Assessment Appearance:: Appears stated age Attitude/Demeanor/Rapport: Engaged Affect (typically observed): Accepting, Pleasant Orientation: : Oriented to Self, Oriented to Place, Oriented to Situation Alcohol / Substance Use: Not Applicable Psych Involvement: No (comment)  Admission diagnosis:  CAP (community acquired pneumonia) [J18.9] Altered mental status, unspecified altered mental status type [R41.82] Community acquired pneumonia of right lung, unspecified part of lung [J18.9] Patient Active Problem List   Diagnosis Date Noted   CAP (community acquired pneumonia) 11/30/2022   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   Sepsis (HCC) 11/30/2022   Fever 09/01/2022   Lethargy 08/31/2022   Hypotension 08/31/2022   DNR (do  not resuscitate) 08/31/2022   Abnormal CT of the abdomen 04/26/2022   Itching of ear 04/10/2022   Effusion, left knee 08/09/2021   Noninfectious otitis externa of left ear 06/08/2021   Rash and other nonspecific skin eruption 04/28/2021   Iron deficiency anemia 02/24/2021   GAVE (gastric  antral vascular ectasia)    Symptomatic anemia 02/04/2021   Anticoagulated    Antiplatelet or antithrombotic long-term use    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   Coronary artery calcification seen on CAT scan 01/11/2021   Acute kidney injury superimposed on chronic kidney disease (HCC) 10/04/2020   Physical deconditioning 09/25/2020   History of DVT (deep vein thrombosis) 09/24/2020   DMII (diabetes mellitus, type 2) (HCC) 09/23/2020   Cellulitis of right hand 09/22/2020   Abscess of dorsum of right hand 09/22/2020   Hoarseness 09/09/2020   Metabolic encephalopathy 02/24/2020   Acute encephalopathy    AMS (altered mental status) 02/23/2020   S/P reverse total shoulder arthroplasty, right    Hypokalemia    DOE (dyspnea on exertion) 02/18/2020   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   S/P reverse total shoulder arthroplasty, left 02/16/2020   Preoperative clearance 01/26/2020   Closed displaced fracture of metatarsal bone of right foot 01/20/2019   History of pulmonary embolus (PE) 12/19/2018   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   Rheumatoid arthritis (HCC)    Bilateral impacted cerumen 08/18/2018   Fungal otitis externa 08/18/2018   Cerumen debris on tympanic membrane of right ear 08/18/2018   Asthmatic bronchitis with exacerbation 07/29/2018   Pulmonary embolism (HCC) 07/29/2018   Cellulitis of forearm    Fever of unknown origin (FUO) 02/10/2018   Diarrhea 02/10/2018   Abdominal pain 02/10/2018   Fracture of base of fifth metatarsal bone with routine healing, left    Closed fracture of fifth metatarsal bone of right foot, initial encounter 01/15/2018   DVT (deep venous thrombosis) (HCC) 01/14/2018   Hypothyroidism 01/14/2018   Depression 01/14/2018   Debility    Protein-calorie malnutrition, moderate (HCC) 12/17/2017   Knee pain    History of breast cancer    Chronic pain syndrome    Fibromyalgia    Graves disease    Vasculitis (HCC) 12/13/2017    Rhabdomyolysis 12/13/2017   Dysphonia 08/10/2017   Pulmonary nodules    Chronic kidney disease, stage 3b (HCC) 04/15/2016   Hx of adenomatous colonic polyps 04/12/2016   Cough 02/09/2016   Opacity of lung on imaging study 11/03/2015   Severe persistent asthma 02/08/2015   Facial pain syndrome 02/08/2015   Insomnia 02/08/2015   Other allergic rhinitis 02/08/2015   LPRD (laryngopharyngeal reflux disease) 02/08/2015   Chronic systolic CHF (congestive heart failure) (HCC) 02/08/2015   Diastolic dysfunction 02/08/2015   Benign paroxysmal positional vertigo 12/03/2012   Dizziness and giddiness 10/29/2012   Disequilibrium 10/29/2012   Pseudogout 02/24/2012   Irritable bowel syndrome 01/02/2010   Hyperlipidemia 12/23/2009   Hyperthyroidism 11/12/2006   Seasonal and perennial allergic rhinitis 11/12/2006   GERD 11/12/2006   NECK PAIN, CHRONIC 11/12/2006   OSTEOPOROSIS 11/12/2006   BREAST CANCER, HX OF 11/12/2006   PCP:  [name], MD Pharmacy:   [facility] - [place], [place] - [address] [place] [address] Phone: [phone] Fax: [phone]     Social Determinants of Health (SDOH) Social History: SDOH Screenings   Food Insecurity: No Food Insecurity (11/30/2022)  Housing: Patient Declined (11/30/2022)  Transportation Needs: No Transportation Needs (  11/30/2022)  Utilities: Not At Risk (11/30/2022)  Alcohol Screen: Low Risk  (11/22/2022)  Depression (PHQ2-9): Low Risk  (11/22/2022)  Financial Resource Strain: Low Risk  (11/22/2022)  Physical Activity: Insufficiently Active (11/22/2022)  Social Connections: Moderately Isolated (11/22/2022)  Stress: No Stress Concern Present (11/22/2022)  Tobacco Use: Low Risk  (11/29/2022)  Health Literacy: Adequate Health Literacy (11/22/2022)   SDOH Interventions:     Readmission Risk Interventions    12/03/2022    3:04 PM 04/27/2022    2:05 PM 09/26/2020    3:55  PM  Readmission Risk Prevention Plan  Transportation Screening Complete Complete Complete  PCP or Specialist Appt within 5-7 Days  Complete   PCP or Specialist Appt within 3-5 Days Complete    Home Care Screening  Complete   Medication Review (RN CM)  Referral to Pharmacy   HRI or Home Care Consult Complete    Social Work Consult for Recovery Care Planning/Counseling Complete    Palliative Care Screening Complete    Medication Review [employer]) Complete  Complete  PCP or Specialist appointment within 3-5 days of discharge   Complete  HRI or Home Care Consult   Complete  SW Recovery Care/Counseling Consult   Complete  Palliative Care Screening   Not Applicable  Skilled Nursing Facility   Not Applicable

## 2022-12-03 NOTE — Telephone Encounter (Signed)
Daughter [name]) called to report patient has been admitted to [facility] for pneumonia and is in Room 1501, [address] [place].  Daughter stated patient may be in hospital a few days longer and may need to go to rehab.  Daughter canceled patient's 10/16 visit and noted she works nights and can leave voice message or MyChart message.

## 2022-12-03 NOTE — Evaluation (Signed)
Occupational Therapy Evaluation Patient Details Name: Ebony Scott MRN: [id number] DOB: [birth year] Today's Date: 12/03/2022   History of Present Illness Patient is a 72 year old female who presenetd after a fall with increaed confusion. Patient was admitted with sepsis, CAP, acute metabolic encephalopathy,. PMH: RA, hypogammaglobulinemia, CKD, Dm II   Clinical Impression   Patient is a 72 year old female who was admitted for above. Patient was living at home with children with independence in ADLs at [facility] level and caregiver support for showers. Patient was min A for bed mobility with patient quick to fatigue with sitting EOB and engagement in grooming tasks with BUE.   Patient was noted to have decreased functional activity tolerance, decreased endurance, decreased standing balance, decreased safety awareness, and decreased knowledge of AD/AE impacting participation in ADLs. Patient will benefit from continued inpatient follow up therapy, <3 hours/day.       If plan is discharge home, recommend the following: Two people to help with walking and/or transfers;A lot of help with bathing/dressing/bathroom;Assistance with cooking/housework;Direct supervision/assist for medications management;Assist for transportation;Help with stairs or ramp for entrance;Direct supervision/assist for financial management    Functional Status Assessment  Patient has had a recent decline in their functional status and demonstrates the ability to make significant improvements in function in a reasonable and predictable amount of time.  Equipment Recommendations  None recommended by OT       Precautions / Restrictions Precautions Precautions: Fall      Mobility Bed Mobility Overal bed mobility: Needs Assistance Bed Mobility: Supine to Sit     Supine to sit: Min assist, HOB elevated, Used rails     General bed mobility comments: min A to upright trunk and slide out to EOB, verbal cues for bedrail use and  sequencing                ADL either performed or assessed with clinical judgement   ADL Overall ADL's : Needs assistance/impaired Eating/Feeding: Modified independent;Sitting Eating/Feeding Details (indicate cue type and reason): taking sips of ensure. reported having pain in mouth. nurse is already awaiting order for magic mouth wash. no signs of thrush Grooming: Brushing hair;Moderate assistance Grooming Details (indicate cue type and reason): with increased tangles in bath R side while sitting EOB. quick to fatigue during session. Upper Body Bathing: Moderate assistance;Sitting   Lower Body Bathing: Maximal assistance;Bed level   Upper Body Dressing : Bed level;Minimal assistance   Lower Body Dressing: Bed level;Maximal assistance     Toilet Transfer Details (indicate cue type and reason): patient transitioned to EOB with significantly increased time and min A with HOB raised. patient declined to stand up after sitting EOB over 10 mins reporting fatigue. Toileting- Clothing Manipulation and Hygiene: Total assistance;Sit to/from stand                Pertinent Vitals/Pain Pain Assessment Pain Assessment: Faces Faces Pain Scale: Hurts even more Pain Location: RUE and back Pain Descriptors / Indicators: Discomfort, Grimacing, Guarding Pain Intervention(s): Monitored during session, Repositioned, Limited activity within patient's tolerance, Premedicated before session     Extremity/Trunk Assessment Upper Extremity Assessment Upper Extremity Assessment: RUE deficits/detail;LUE deficits/detail RUE Deficits / Details: able to ROM to about 80 degrees shoulder abduction/flexion. noted to have signs of arthritis in MCPs. grip strenght is poor for squeezing but patient able to participate in functional tasks. RUE: Unable to fully assess due to pain LUE Deficits / Details: able to ROM to about 80 degrees shoulder abduction/flexion. signs  of arthritis in MCPs with noted  redness and increased heat on this side. grip strenght is poor for squeezing but patient able to participate in functional tasks.   Lower Extremity Assessment Lower Extremity Assessment: Defer to PT evaluation   Cervical / Trunk Assessment Cervical / Trunk Assessment: Normal      Cognition Arousal: Alert Behavior During Therapy: WFL for tasks assessed/performed Overall Cognitive Status: Within Functional Limits for tasks assessed                      Home Living Family/patient expects to be discharged to:: Private residence Living Arrangements: Children Available Help at Discharge: Family;Available PRN/intermittently Type of Home: House Home Access: Stairs to enter [employer] of Steps: 6 Entrance Stairs-Rails: Right;Left Home Layout: Two level;Able to live on main level with bedroom/bathroom     Bathroom Shower/Tub: Producer, television/film/video: Handicapped height     Home Equipment: Rollator (4 wheels)          Prior Functioning/Environment Prior Level of Function : Independent/Modified Independent             Mobility Comments: pt reports using rollator walker in the home and community distances ADLs Comments: pt reports caregiver assists with showers at home, otherwise ind with self care        OT Problem List: Decreased activity tolerance;Impaired balance (sitting and/or standing);Decreased coordination;Decreased safety awareness;Decreased knowledge of precautions;Decreased knowledge of use of DME or AE;Impaired UE functional use;Pain      OT Treatment/Interventions: Self-care/ADL training;Therapeutic exercise;DME and/or AE instruction;Therapeutic activities;Patient/family education;Balance training    OT Goals(Current goals can be found in the care plan section) Acute Rehab OT Goals Patient Stated Goal: to get pain under control OT Goal Formulation: With patient Time For Goal Achievement: 12/17/22 Potential to Achieve Goals: Fair   OT Frequency: Min 1X/week       AM-PAC OT "6 Clicks" Daily Activity     Outcome Measure Help from another person eating meals?: A Little Help from another person taking care of personal grooming?: A Lot Help from another person toileting, which includes using toliet, bedpan, or urinal?: A Lot Help from another person bathing (including washing, rinsing, drying)?: A Lot Help from another person to put on and taking off regular upper body clothing?: A Lot Help from another person to put on and taking off regular lower body clothing?: A Lot 6 Click Score: 13   End of Session Nurse Communication: Mobility status  Activity Tolerance: Patient tolerated treatment well;Patient limited by fatigue Patient left: in bed;with call bell/phone within reach;with bed alarm set  OT Visit Diagnosis: Unsteadiness on feet (R26.81);Pain Pain - Right/Left: Right Pain - part of body: Shoulder;Arm;Hand                Time: 4098-1191 OT Time Calculation (min): 21 min Charges:  OT General Charges $OT Visit: 1 Visit OT Evaluation $OT Eval Low Complexity: 1 Low  Yanai Hobson OTR/L, MS Acute Rehabilitation Department Office# [phone]   Selinda Flavin 12/03/2022, 4:09 PM

## 2022-12-04 DIAGNOSIS — D801 Nonfamilial hypogammaglobulinemia: Secondary | ICD-10-CM | POA: Diagnosis not present

## 2022-12-04 DIAGNOSIS — R4182 Altered mental status, unspecified: Secondary | ICD-10-CM | POA: Diagnosis not present

## 2022-12-04 DIAGNOSIS — J189 Pneumonia, unspecified organism: Secondary | ICD-10-CM | POA: Diagnosis not present

## 2022-12-04 LAB — CBC
HCT: 31.1 % — ABNORMAL LOW (ref 36.0–46.0)
Hemoglobin: 9.8 g/dL — ABNORMAL LOW (ref 12.0–15.0)
MCH: 34.6 pg — ABNORMAL HIGH (ref 26.0–34.0)
MCHC: 31.5 g/dL (ref 30.0–36.0)
MCV: 109.9 fL — ABNORMAL HIGH (ref 80.0–100.0)
Platelets: 165 10*3/uL (ref 150–400)
RBC: 2.83 MIL/uL — ABNORMAL LOW (ref 3.87–5.11)
RDW: 16.1 % — ABNORMAL HIGH (ref 11.5–15.5)
WBC: 9.4 10*3/uL (ref 4.0–10.5)
nRBC: 0 % (ref 0.0–0.2)

## 2022-12-04 LAB — BASIC METABOLIC PANEL
Anion gap: 10 (ref 5–15)
BUN: 33 mg/dL — ABNORMAL HIGH (ref 8–23)
CO2: 23 mmol/L (ref 22–32)
Calcium: 8.2 mg/dL — ABNORMAL LOW (ref 8.9–10.3)
Chloride: 110 mmol/L (ref 98–111)
Creatinine, Ser: 1.29 mg/dL — ABNORMAL HIGH (ref 0.44–1.00)
GFR, Estimated: 44 mL/min — ABNORMAL LOW (ref >60)
Glucose, Bld: 103 mg/dL — ABNORMAL HIGH (ref 70–99)
Potassium: 4.1 mmol/L (ref 3.5–5.1)
Sodium: 143 mmol/L (ref 135–145)

## 2022-12-04 LAB — GLUCOSE, CAPILLARY
Glucose-Capillary: 95 mg/dL (ref 70–99)
Glucose-Capillary: 141 mg/dL — ABNORMAL HIGH (ref 70–99)
Glucose-Capillary: 137 mg/dL — ABNORMAL HIGH (ref 70–99)
Glucose-Capillary: 102 mg/dL — ABNORMAL HIGH (ref 70–99)

## 2022-12-04 MED ORDER — GUAIFENESIN-DM 100-10 MG/5ML PO SYRP
5.0000 mL | ORAL_SOLUTION | ORAL | Status: DC | PRN
  Administered 2022-12-04 – 2022-12-05 (×3): 5 mL via ORAL
  Filled 2022-12-04 (×4): qty 5

## 2022-12-04 NOTE — TOC Progression Note (Signed)
Transition of Care [facility]) - Progression Note    Patient Details  Name: Ebony Scott MRN: [id number] Date of Birth: Apr 13, 1950  [facility] CM/SW Contact  Otelia Santee, LCSW Phone Number: 12/04/2022, 12:21 PM  Clinical Narrative:    Reviewed bed offers for SNF with pt. Pt shares she would like to review with her daughter prior to making decision. She shares her daughter works 2 shift and is often asleep during the day and requested CSW to text daughter. CSW contacted daughter via text to review bed offers.  CSW will await bed choice.    Expected Discharge Plan: Skilled Nursing Facility Barriers to Discharge: Continued Medical Work up, SNF Pending bed offer  Expected Discharge Plan and Services In-house Referral: Clinical Social Work Discharge Planning Services: NA Post Acute Care Choice: Skilled Nursing Facility Living arrangements for the past 2 months: Single Family Home                 DME Arranged: N/A DME Agency: NA                   Social Determinants of Health (SDOH) Interventions SDOH Screenings   Food Insecurity: No Food Insecurity (11/30/2022)  Housing: Patient Declined (11/30/2022)  Transportation Needs: No Transportation Needs (11/30/2022)  Utilities: Not At Risk (11/30/2022)  Alcohol Screen: Low Risk  (11/22/2022)  Depression (PHQ2-9): Low Risk  (11/22/2022)  Financial Resource Strain: Low Risk  (11/22/2022)  Physical Activity: Insufficiently Active (11/22/2022)  Social Connections: Moderately Isolated (11/22/2022)  Stress: No Stress Concern Present (11/22/2022)  Tobacco Use: Low Risk  (11/29/2022)  Health Literacy: Adequate Health Literacy (11/22/2022)    Readmission Risk Interventions    12/03/2022    3:04 PM 04/27/2022    2:05 PM 09/26/2020    3:55 PM  Readmission Risk Prevention Plan  Transportation Screening Complete Complete Complete  PCP or Specialist Appt within 5-7 Days  Complete   PCP or Specialist Appt within 3-5 Days  Complete    Home Care Screening  Complete   Medication Review (RN CM)  Referral to Pharmacy   HRI or Home Care Consult Complete    Social Work Consult for Recovery Care Planning/Counseling Complete    Palliative Care Screening Complete    Medication Review Oceanographer) Complete  Complete  PCP or Specialist appointment within 3-5 days of discharge   Complete  HRI or Home Care Consult   Complete  SW Recovery Care/Counseling Consult   Complete  Palliative Care Screening   Not Applicable  Skilled Nursing Facility   Not Applicable

## 2022-12-04 NOTE — Progress Notes (Signed)
Physical Therapy Treatment Patient Details Name: Ebony Scott MRN: [id number] DOB: [birth year] Today's Date: 12/04/2022   History of Present Illness Pt is a 72 yo female admitted with community acquired pneumnonia. PMH: asthma, hypothyroidism, RA, hypoglobulinemia, bronchiectasis, CAD, GERD, chronic systolic CHF with EF 30 to 35%, G1 DD (per echo 07/17/2022)    PT Comments  Pt motivated to improve, agreeable to therapy. Pt tolerates exercises without complaints while supine in bed. Pt needing min A for transfers to power up, c/o generalized pain in back and bil knees with movement. Pt amb short distances with seated rest break between in room, using RW, min A for general unsteadiness. Pt denies pain, SOB, dizziness throughout session, reporting tired and low energy. Patient will benefit from continued inpatient follow up therapy, <3 hours/day.    If plan is discharge home, recommend the following: A little help with walking and/or transfers;A little help with bathing/dressing/bathroom;Assistance with cooking/housework;Assist for transportation;Help with stairs or ramp for entrance   Can travel by private vehicle     Yes  Equipment Recommendations  None recommended by PT    Recommendations for Other Services       Precautions / Restrictions Precautions Precautions: Fall Restrictions Weight Bearing Restrictions: No     Mobility  Bed Mobility Overal bed mobility: Needs Assistance Bed Mobility: Supine to Sit, Sit to Supine     Supine to sit: Contact guard, HOB elevated, Used rails Sit to supine: Contact guard assist, Used rails   General bed mobility comments: CGA to upright trunk into sitting, strong use of bedrails and elevated HOB with increased effort; CGA for return to supine, pt using bedrails to assist in scooting up in bed    Transfers Overall transfer level: Needs assistance Equipment used: Rolling walker (2 wheels) Transfers: Sit to/from Stand Sit to Stand: Min  assist           General transfer comment: verbal cues for hand placement and LE engagement, pt slow to power up to standing needing min A to power up with BLE Braced against front of bed    Ambulation/Gait Ambulation/Gait assistance: Min assist Gait Distance (Feet): 10 Feet (x2) Assistive device: Rolling walker (2 wheels) Gait Pattern/deviations: Step-to pattern, Decreased stride length, Trunk flexed Gait velocity: decreased     General Gait Details: slow, short steps with RW in room, trunk slightly flexed, limited by generalized pain complaints in back and knees, pt reports being tired and requests to return to supine   Stairs             Wheelchair Mobility     Tilt Bed    Modified Rankin (Stroke Patients Only)       Balance Overall balance assessment: Needs assistance Sitting-balance support: Feet supported, Single extremity supported Sitting balance-Leahy Scale: Fair     Standing balance support: During functional activity, Bilateral upper extremity supported, Reliant on assistive device for balance Standing balance-Leahy Scale: Poor                              Cognition Arousal: Alert Behavior During Therapy: WFL for tasks assessed/performed Overall Cognitive Status: Within Functional Limits for tasks assessed                                          Exercises General Exercises - Lower Extremity Ankle Circles/Pumps:  AROM, Both, 10 reps, Supine Quad Sets: AROM, Strengthening, Both, 20 reps, Supine Hip ABduction/ADduction: AROM, Strengthening, Both, 10 reps, Supine Straight Leg Raises: AROM, Strengthening, Both, 10 reps, Supine    General Comments        Pertinent Vitals/Pain Pain Assessment Pain Assessment: Faces Faces Pain Scale: Hurts little more Pain Location: back, bil knees Pain Descriptors / Indicators: Discomfort, Grimacing, Guarding Pain Intervention(s): Limited activity within patient's tolerance,  Monitored during session, Repositioned    Home Living                          Prior Function            PT Goals (current goals can now be found in the care plan section) Acute Rehab PT Goals Patient Stated Goal: regain strength and independence PT Goal Formulation: With patient Time For Goal Achievement: 12/17/22 Potential to Achieve Goals: Good Progress towards PT goals: Progressing toward goals    Frequency    Min 1X/week      PT Plan      Co-evaluation              AM-PAC PT "6 Clicks" Mobility   Outcome Measure  Help needed turning from your back to your side while in a flat bed without using bedrails?: A Little Help needed moving from lying on your back to sitting on the side of a flat bed without using bedrails?: A Little Help needed moving to and from a bed to a chair (including a wheelchair)?: A Little Help needed standing up from a chair using your arms (e.g., wheelchair or bedside chair)?: A Little Help needed to walk in hospital room?: A Lot Help needed climbing 3-5 steps with a railing? : Total 6 Click Score: 15    End of Session Equipment Utilized During Treatment: Gait belt Activity Tolerance: Patient tolerated treatment well;Patient limited by fatigue Patient left: in bed;with call bell/phone within reach;with bed alarm set Nurse Communication: Mobility status PT Visit Diagnosis: Muscle weakness (generalized) (M62.81);Unsteadiness on feet (R26.81)     Time: 8657-8469 PT Time Calculation (min) (ACUTE ONLY): 24 min  Charges:    $Therapeutic Exercise: 8-22 mins $Therapeutic Activity: 8-22 mins PT General Charges $$ ACUTE PT VISIT: 1 Visit                     Tori Hanzel Pizzo PT, DPT 12/04/22, 2:50 PM

## 2022-12-04 NOTE — TOC CM/SW Note (Signed)
CMS list of facilities and star ratings provided to pt to review for facility preference.    [facility] [address] [place], [place] [address] [phone] Overall rating ??  Below average  [facility] at the [facility] [address] [place], [place] [address] [phone] Overall rating ?? Below average  [facility] [address] [place], [place] [address] [phone] Overall rating?? Below average  [facility] [address] [place], [place] [address] [phone] Overall rating ? Much below average  [facility] [address] [place], [place] [address] [phone] Overall rating ??? Average  [facility] [address] [place], [place] [address] [phone] Overall rating ? Much below average [facility] [address] [place], [place] [address] [phone] Overall rating ? Much below average  [employer] [facility] [employer] [facility] [address] [place], [place] [address] [phone] Overall rating ??? Average  [facility] [address] [place], [place] [address] [phone] Overall rating ? Much below average  [facility] [address] [place], [place] [address] [phone] Overall rating ?? Much below average  [facility] [address] [place], [place] [address] [phone] Overall rating ???? Above average  [facility] [address] [place], [place] [address] [phone] Overall rating ????? Much above average [facility] [address] [place], [place] [address] [phone] Overall rating ???? Above average  [facility] [address] [place], [place] [address] [phone] Overall rating ????? Much above average  [facility] [address] [place], [place] [address] [phone] Overall rating ????  [facility] [address] [place], [place] [address] [phone] Overall rating ????? Much above average  [facility] [address] [place], [place] [address] [phone] Overall rating ????? Much above average  [facility] [address] [place], [place] [address] [phone] Overall rating ? Much below average  [facility] [address] [place], [place] [address] [phone] Overall rating ??? Average  [facility] [address] [place], [place] [address] [phone] Overall rating ????? Much above average  [facility] [address] [place], [place] [address] [phone] Overall rating ??? Average  [facility] [address] [place], [place] [address] [phone] Overall rating ?? Below average  [facility] [address] [place], [place] [address] [phone] Overall rating ??? Average  [facility] [address] [place], [place] [address] [phone] Overall rating ????? Much above average  [facility] and [facility] [address] [place], [place] [address] [phone] Overall rating ? Much below average  [facility]	[place] [facility] the [facility], [place] [address] [phone] Overall rating ????? Much above average  [facility] [address] [place], [place] [address] [phone] Overall rating ? Much below average  [facility] [address] [place], [place] [address] [phone] Overall rating ? Much below average  [facility] [address] [place], [place] [address] [phone] Overall rating ????? Much above average  [facility] [address] [place], [place] [address] [phone] Overall rating ??? Average  [facility] [address] [place], [place] [address] [phone] Overall rating ?? Below average  [facility] [address] [place], [place] [address] [phone] Overall rating ? Much below average  [facility] [address] [place], [place] [address] [phone] Overall rating ??? Average  [facility] [address] [place], [place] [address] [phone] Overall rating ? Much below average  [facility] [address] [place], [place] [address] [phone] Overall rating ??? Average [facility] [address] [place], [place] [address] [phone] Overall rating ? Much below average  [facility] [address] [place], [place] [address] [phone] Overall rating ????? Much above average  [facility] [address] [place], [place] [address] [phone] Overall rating ? Much below average  [facility] [place] [address] [place], [place] [address] [phone] Overall rating ???? Above average  [facility] [address] [place], [place] [address] [phone] Overall rating ????? Above average

## 2022-12-04 NOTE — Progress Notes (Signed)
Pt did not sleep well overnight. Pt uses CPAP at home and used 2L [place] to sleep overnight stating that the O2 helped her sleep better. Pt is requesting ointment for inside her nose. RN used PRN saline to moisten nasal membranes.    Pt is concerned about TOC SNF options and wants to talk to social work about open beds and options.   No BM overnight. Voiding via purwick. RN encouraged PO intake.   Rounding completed hourly and Pt without acute distress overnight.

## 2022-12-04 NOTE — Plan of Care (Signed)
Problem: Education: Goal: Knowledge of cardiac device and self-care will improve Outcome: Progressing Goal: Ability to safely manage health related needs after discharge will improve Outcome: Progressing Goal: Individualized Educational Video(s) Outcome: Progressing   Problem: Cardiac: Goal: Ability to achieve and maintain adequate cardiopulmonary perfusion will improve Outcome: Progressing   Problem: Education: Goal: Ability to describe self-care measures that may prevent or decrease complications (Diabetes Survival Skills Education) will improve Outcome: Progressing Goal: Individualized Educational Video(s) Outcome: Progressing   Problem: Coping: Goal: Ability to adjust to condition or change in health will improve Outcome: Progressing   Problem: Fluid Volume: Goal: Ability to maintain a balanced intake and output will improve Outcome: Progressing   Problem: Health Behavior/Discharge Planning: Goal: Ability to identify and utilize available resources and services will improve Outcome: Progressing Goal: Ability to manage health-related needs will improve Outcome: Progressing   Problem: Metabolic: Goal: Ability to maintain appropriate glucose levels will improve Outcome: Progressing   Problem: Nutritional: Goal: Maintenance of adequate nutrition will improve Outcome: Progressing Goal: Progress toward achieving an optimal weight will improve Outcome: Progressing   Problem: Skin Integrity: Goal: Risk for impaired skin integrity will decrease Outcome: Progressing   Problem: Tissue Perfusion: Goal: Adequacy of tissue perfusion will improve Outcome: Progressing   Problem: Education: Goal: Knowledge of General Education information will improve Description: Including pain rating scale, medication(s)/side effects and non-pharmacologic comfort measures Outcome: Progressing   Problem: Health Behavior/Discharge Planning: Goal: Ability to manage health-related needs  will improve Outcome: Progressing   Problem: Clinical Measurements: Goal: Ability to maintain clinical measurements within normal limits will improve Outcome: Progressing Goal: Will remain free from infection Outcome: Progressing Goal: Diagnostic test results will improve Outcome: Progressing Goal: Respiratory complications will improve Outcome: Progressing Goal: Cardiovascular complication will be avoided Outcome: Progressing   Problem: Activity: Goal: Risk for activity intolerance will decrease Outcome: Progressing   Problem: Nutrition: Goal: Adequate nutrition will be maintained Outcome: Progressing   Problem: Coping: Goal: Level of anxiety will decrease Outcome: Progressing   Problem: Elimination: Goal: Will not experience complications related to bowel motility Outcome: Progressing Goal: Will not experience complications related to urinary retention Outcome: Progressing   Problem: Pain Managment: Goal: General experience of comfort will improve Outcome: Progressing   Problem: Safety: Goal: Ability to remain free from injury will improve Outcome: Progressing   Problem: Skin Integrity: Goal: Risk for impaired skin integrity will decrease Outcome: Progressing

## 2022-12-04 NOTE — Progress Notes (Signed)
PHARMACY - PHYSICIAN COMMUNICATION CRITICAL VALUE ALERT - BLOOD CULTURE IDENTIFICATION (BCID)  Ebony Scott is an 72 y.o. female who presented to [facility] on 11/29/2022 with a chief complaint of found down at home, confused, oriented to person and place.   Assessment:  GPR, 1/4, anaerobic  Name of physician (or Provider) Contacted: Johann Capers  Current antibiotics: CTX 2gm q24h  Changes to prescribed antibiotics recommended:  none  No results found. However, due to the size of the patient record, not all encounters were searched. Please check Results Review for a complete set of results.  Arley Phenix RPh 12/04/2022, 3:24 AM

## 2022-12-04 NOTE — Progress Notes (Signed)
[facility]                                                                              Ebony Scott, is a 72 y.o. female, DOB - 05-19-1950, NWG:[id number] Admit date - 11/29/2022    Outpatient Primary MD for the patient is [name], [name], MD  LOS - 4  days  Chief Complaint  Patient presents with   Weakness       Brief summary   Patient is a 72 year old female with asthma, hypothyroidism, RA, hypoglobulinemia, bronchiectasis, CAD, GERD, chronic systolic CHF with EF 30 to 35%, G1 DD (per echo 07/17/2022) presented to ED with feeling ill for last 2 to 3 days.  Patient was found down at home, confused, oriented to person and place.  No report of chest pain, shortness of breath or dysuria however did have cough during encounter. Patient was febrile 88, temp of 102.6 F.  RR 17, heart rate 89, BP 155/66 O2 sats 93% on room air Hemoglobin 13.3, WBCs 14.7 CT abdomen showed patchy opacities in the lower lungs with small right pleural effusion, concerning for pneumonia  Assessment & Plan    Principal Problem: Sepsis, POA due to CAP (community acquired pneumonia) -Presented with fevers, leukocytosis, source likely due to pneumonia.   -Urine strep antigen negative, urine Legionella antigen negative -Sepsis physiology improved, leukocytosis resolved, blood cultures negative so far  -COVID-19, flu, RSV negative, respiratory virus panel negative -Continue IV Rocephin, Zithromax  Active Problems:   Acute metabolic encephalopathy, generalized debility -Likely due to to #1, CT head negative for acute CVA -Per daughter, patient does get delirious when she has fever or infection -SLP evaluation recommended dysphagia 3 diet mechanical soft with thin liquids, patient had requested regular diet, so far tolerating  -PT evaluation recommended SNF    Rheumatoid arthritis (HCC) -Continue prednisone, not on DMARDs likely due to recurrent infections    Hypogammaglobulinemia  (HCC) -Hold Hizentra for now, resume on discharge.    Prior history of DVT/PE, antiplatelet or antithrombotic long-term use -Continue Xarelto    Chronic systolic CHF (congestive heart failure) (HCC) -Continue BiDil, Entresto  -Creatinine at baseline    Chronic kidney disease, stage 3b (HCC) -Baseline creatinine 1.3-1.5. -Creatinine still at baseline    DMII (diabetes mellitus, type 2) (HCC) -Hemoglobin A1c 6.2 on 09/11/2022 -CBG (last 3)  Recent Labs    12/03/22 2344 12/04/22 0720 12/04/22 1156  GLUCAP 159* 95 141*   -Continue sliding scale insulin  Estimated body mass index is 26.31 kg/m as calculated from the following:   Height as of this encounter: 5' (1.524 m).   Weight as of this encounter: 61.1 kg.  Code Status: DNR DVT Prophylaxis:   rivaroxaban (XARELTO) tablet 20 mg   Level of Care: Level of care: Med-Surg Family Communication: Updated patient's daughter, Jaaliyah Loayza on the phone on 10/13, left voicemail today Disposition Plan:      Remains inpatient appropriate: PT evaluation recommended SNF   Procedures:    Consultants:     Antimicrobials:   Anti-infectives (From admission, onward)    Start     Dose/Rate Route Frequency Ordered Stop  11/30/22 2200  azithromycin (ZITHROMAX) 500 mg in sodium chloride 0.9 % 250 mL IVPB        500 mg 250 mL/hr over 60 Minutes Intravenous Daily at bedtime 11/30/22 0552 12/04/22 1113   11/30/22 0130  azithromycin (ZITHROMAX) 500 mg in sodium chloride 0.9 % 250 mL IVPB        500 mg 250 mL/hr over 60 Minutes Intravenous  Once 11/30/22 0115 11/30/22 0309   11/29/22 2315  cefTRIAXone (ROCEPHIN) 2 g in sodium chloride 0.9 % 100 mL IVPB        2 g 200 mL/hr over 30 Minutes Intravenous Every 24 hours 11/29/22 2309 12/06/22 2314          Medications  allopurinol  100 mg Oral Daily   atorvastatin  80 mg Oral Daily   budesonide  0.5 mg Nebulization BID   clopidogrel  75 mg Oral Daily   empagliflozin  10 mg Oral QAC  breakfast   feeding supplement  237 mL Oral BID BM   gabapentin  300 mg Oral QHS   insulin aspart  0-5 Units Subcutaneous QHS   insulin aspart  0-6 Units Subcutaneous TID WC   ipratropium-albuterol  3 mL Nebulization BID   isosorbide-hydrALAZINE  1 tablet Oral BID   magic mouthwash w/lidocaine  5 mL Oral QID   predniSONE  7 mg Oral Q breakfast   rivaroxaban  20 mg Oral Q supper   sacubitril-valsartan  1 tablet Oral BID   traMADol  50 mg Oral QHS   traZODone  50 mg Oral QHS      Subjective:   Ebony Scott was seen and examined today.  Alert and oriented, eating breakfast without any difficulty, states she is feeling a lot better now.  No fevers or chills no acute complaints today.   Objective:   Vitals:   12/04/22 0445 12/04/22 0821 12/04/22 1054 12/04/22 1200  BP: (!) 122/59  (!) 143/62 (!) 141/62  Pulse: 84  86 83  Resp: 17   20  Temp: 98.4 F (36.9 C)   98.5 F (36.9 C)  TempSrc: Oral   Oral  SpO2: 97% 96%  94%  Weight:      Height:        Intake/Output Summary (Last 24 hours) at 12/04/2022 1401 Last data filed at 12/04/2022 0948 Gross per 24 hour  Intake 600 ml  Output 800 ml  Net -200 ml     Wt Readings from Last 3 Encounters:  11/30/22 61.1 kg  11/22/22 57.6 kg  09/11/22 57.6 kg    Physical Exam General: Alert and oriented x 3, NAD Cardiovascular: S1 S2 clear, RRR.  Respiratory: CTAB, no wheezing Gastrointestinal: Soft, nontender, nondistended, NBS Ext: no pedal edema bilaterally Neuro: no new deficits Psych: Normal affect   Data Reviewed:  I have personally reviewed following labs    CBC Lab Results  Component Value Date   WBC 9.4 12/04/2022   RBC 2.83 (L) 12/04/2022   HGB 9.8 (L) 12/04/2022   HCT 31.1 (L) 12/04/2022   MCV 109.9 (H) 12/04/2022   MCH 34.6 (H) 12/04/2022   PLT 165 12/04/2022   MCHC 31.5 12/04/2022   RDW 16.1 (H) 12/04/2022   LYMPHSABS 1.9 11/29/2022   MONOABS 2.0 (H) 11/29/2022   EOSABS 0.4 11/29/2022   BASOSABS 0.1  11/29/2022     Last metabolic panel Lab Results  Component Value Date   NA 143 12/04/2022   K 4.1 12/04/2022   CL 110 12/04/2022  CO2 23 12/04/2022   BUN 33 (H) 12/04/2022   CREATININE 1.29 (H) 12/04/2022   GLUCOSE 103 (H) 12/04/2022   GFRNONAA 44 (L) 12/04/2022   GFRAA 40 (L) 08/25/2020   CALCIUM 8.2 (L) 12/04/2022   PHOS 6.5 (H) 10/04/2020   PROT 6.1 (L) 11/30/2022   ALBUMIN 3.0 (L) 11/30/2022   LABGLOB 2.3 08/07/2022   AGRATIO 1.7 12/21/2020   BILITOT 0.7 11/30/2022   ALKPHOS 48 11/30/2022   AST 41 11/30/2022   ALT 39 11/30/2022   ANIONGAP 10 12/04/2022    CBG (last 3)  Recent Labs    12/03/22 2344 12/04/22 0720 12/04/22 1156  GLUCAP 159* 95 141*      Coagulation Profile: Recent Labs  Lab 11/29/22 2255  INR 1.1     Radiology Studies: I have personally reviewed the imaging studies  No results found.     Thad Ranger M.D. [facility] 12/04/2022, 2:01 PM  Available via Epic secure chat 7am-7pm After 7 pm, please refer to night coverage provider listed on amion.

## 2022-12-05 ENCOUNTER — Ambulatory Visit (HOSPITAL_BASED_OUTPATIENT_CLINIC_OR_DEPARTMENT_OTHER): Payer: Medicare PPO | Admitting: Cardiology

## 2022-12-05 DIAGNOSIS — J189 Pneumonia, unspecified organism: Secondary | ICD-10-CM | POA: Diagnosis not present

## 2022-12-05 DIAGNOSIS — R4182 Altered mental status, unspecified: Secondary | ICD-10-CM | POA: Diagnosis not present

## 2022-12-05 LAB — BASIC METABOLIC PANEL
Anion gap: 12 (ref 5–15)
BUN: 29 mg/dL — ABNORMAL HIGH (ref 8–23)
CO2: 22 mmol/L (ref 22–32)
Calcium: 8.8 mg/dL — ABNORMAL LOW (ref 8.9–10.3)
Chloride: 110 mmol/L (ref 98–111)
Creatinine, Ser: 1.19 mg/dL — ABNORMAL HIGH (ref 0.44–1.00)
GFR, Estimated: 48 mL/min — ABNORMAL LOW (ref >60)
Glucose, Bld: 91 mg/dL (ref 70–99)
Potassium: 4.2 mmol/L (ref 3.5–5.1)
Sodium: 144 mmol/L (ref 135–145)

## 2022-12-05 LAB — CBC
HCT: 30.1 % — ABNORMAL LOW (ref 36.0–46.0)
Hemoglobin: 9.4 g/dL — ABNORMAL LOW (ref 12.0–15.0)
MCH: 34.6 pg — ABNORMAL HIGH (ref 26.0–34.0)
MCHC: 31.2 g/dL (ref 30.0–36.0)
MCV: 110.7 fL — ABNORMAL HIGH (ref 80.0–100.0)
Platelets: 173 10*3/uL (ref 150–400)
RBC: 2.72 MIL/uL — ABNORMAL LOW (ref 3.87–5.11)
RDW: 16.1 % — ABNORMAL HIGH (ref 11.5–15.5)
WBC: 8.8 10*3/uL (ref 4.0–10.5)
nRBC: 0 % (ref 0.0–0.2)

## 2022-12-05 LAB — CULTURE, BLOOD (ROUTINE X 2): Culture: NO GROWTH

## 2022-12-05 LAB — GLUCOSE, CAPILLARY
Glucose-Capillary: 81 mg/dL (ref 70–99)
Glucose-Capillary: 121 mg/dL — ABNORMAL HIGH (ref 70–99)
Glucose-Capillary: 133 mg/dL — ABNORMAL HIGH (ref 70–99)
Glucose-Capillary: 119 mg/dL — ABNORMAL HIGH (ref 70–99)

## 2022-12-05 MED ORDER — IPRATROPIUM-ALBUTEROL 0.5-2.5 (3) MG/3ML IN SOLN
3.0000 mL | RESPIRATORY_TRACT | Status: DC | PRN
  Administered 2022-12-05: 3 mL via RESPIRATORY_TRACT
  Filled 2022-12-05: qty 3

## 2022-12-05 MED ORDER — MUPIROCIN 2 % EX OINT
TOPICAL_OINTMENT | Freq: Two times a day (BID) | CUTANEOUS | Status: DC
  Administered 2022-12-06: 1 via NASAL
  Filled 2022-12-05: qty 22

## 2022-12-05 NOTE — Progress Notes (Signed)
Progress Note   Patient: Ebony Scott WJX:[id number] DOB: October 27, 1950 DOA: 11/29/2022     5 DOS: the patient was seen and examined on 12/05/2022   Brief hospital course: 72 year old female with asthma, hypothyroidism, RA, hypoglobulinemia, bronchiectasis, CAD, GERD, chronic systolic CHF with EF 30 to 35%, G1 DD (per echo 07/17/2022) presented to ED with feeling ill for last 2 to 3 days. Patient was found down at home, confused, oriented to person and place. No report of chest pain, shortness of breath or dysuria however did have cough during encounter.   Assessment and Plan: Principal Problem: Sepsis, POA due to CAP (community acquired pneumonia) -Presented with fevers, leukocytosis, source likely due to pneumonia.   -Urine strep antigen negative, urine Legionella antigen negative -Sepsis physiology improved, leukocytosis resolved, blood cultures negative so far  -COVID-19, flu, RSV negative, respiratory virus panel negative -Continue IV Rocephin until 10/17, completed Zithromax   Active Problems:   Acute metabolic encephalopathy, generalized debility -Likely due to to #1, CT head negative for acute CVA -Per daughter, patient does get delirious when she has fever or infection -SLP evaluation recommended dysphagia 3 diet mechanical soft with thin liquids, patient had requested regular diet, so far tolerating  -PT evaluation recommended SNF, TOC following     Rheumatoid arthritis (HCC) -Continue prednisone, not on DMARDs likely due to recurrent infections     Hypogammaglobulinemia (HCC) -Hold Hizentra for now, resume on discharge.     Prior history of DVT/PE, antiplatelet or antithrombotic long-term use -Continue Xarelto     Chronic systolic CHF (congestive heart failure) (HCC) -Continue BiDil, Entresto  -Creatinine at baseline     Chronic kidney disease, stage 3b (HCC) -Baseline creatinine 1.3-1.5. -Creatinine still at baseline     DMII (diabetes mellitus, type 2)  (HCC) -Hemoglobin A1c 6.2 on 09/11/2022 -Continue sliding scale insulin while in hospital   Subjective: Complaining of pain in nostril  Physical Exam: Vitals:   12/05/22 0524 12/05/22 0817 12/05/22 0835 12/05/22 1240  BP: (!) 150/87  (!) 164/63 (!) 115/96  Pulse: 71  81 81  Resp: 18     Temp: 97.7 F (36.5 C)   (!) 97.3 F (36.3 C)  TempSrc: Oral   Oral  SpO2: 96% 95% 95% 95%  Weight:      Height:       General exam: Awake, laying in bed, in nad Respiratory system: Normal respiratory effort, no wheezing Cardiovascular system: regular rate, s1, s2 Gastrointestinal system: Soft, nondistended, positive BS Central nervous system: CN2-12 grossly intact, strength intact Extremities: Perfused, no clubbing Skin: Normal skin turgor, no notable skin lesions seen Psychiatry: Mood normal // no visual hallucinations   Data Reviewed:  Labs reviewed: Na 144, K 4.2, Cr 1.19, WBC 8.8, Hgb 9.4, Plts 173   Family Communication: Pt in room, family not at bedside  Disposition: Status is: Inpatient Remains inpatient appropriate because: severity of illness  Planned Discharge Destination: Skilled nursing facility    Author: Rickey Barbara, MD 12/05/2022 4:25 PM  For on call review [email or web address].

## 2022-12-05 NOTE — TOC Progression Note (Addendum)
Transition of Care [facility]) - Progression Note    Patient Details  Name: Ebony Scott MRN: [id number] Date of Birth: 07-Oct-1950  [facility] CM/SW Contact  Otelia Santee, LCSW Phone Number: 12/05/2022, 2:16 PM  Clinical Narrative:    Awaiting response from daughter for bed choice.   ADDENDUM: Family have accepted bed offer for private room at [facility] Harvey has been requested and currently pending.  Expected Discharge Plan: Skilled Nursing Facility Barriers to Discharge: Continued Medical Work up, SNF Pending bed offer  Expected Discharge Plan and Services In-house Referral: Clinical Social Work Discharge Planning Services: NA Post Acute Care Choice: Skilled Nursing Facility Living arrangements for the past 2 months: Single Family Home                 DME Arranged: N/A DME Agency: NA                   Social Determinants of Health (SDOH) Interventions SDOH Screenings   Food Insecurity: No Food Insecurity (11/30/2022)  Housing: Patient Declined (11/30/2022)  Transportation Needs: No Transportation Needs (11/30/2022)  Utilities: Not At Risk (11/30/2022)  Alcohol Screen: Low Risk  (11/22/2022)  Depression (PHQ2-9): Low Risk  (11/22/2022)  Financial Resource Strain: Low Risk  (11/22/2022)  Physical Activity: Insufficiently Active (11/22/2022)  Social Connections: Moderately Isolated (11/22/2022)  Stress: No Stress Concern Present (11/22/2022)  Tobacco Use: Low Risk  (11/29/2022)  Health Literacy: Adequate Health Literacy (11/22/2022)    Readmission Risk Interventions    12/03/2022    3:04 PM 04/27/2022    2:05 PM 09/26/2020    3:55 PM  Readmission Risk Prevention Plan  Transportation Screening Complete Complete Complete  PCP or Specialist Appt within 5-7 Days  Complete   PCP or Specialist Appt within 3-5 Days Complete    Home Care Screening  Complete   Medication Review (RN CM)  Referral to Pharmacy   HRI or Home Care Consult Complete     Social Work Consult for Recovery Care Planning/Counseling Complete    Palliative Care Screening Complete    Medication Review Oceanographer) Complete  Complete  PCP or Specialist appointment within 3-5 days of discharge   Complete  HRI or Home Care Consult   Complete  SW Recovery Care/Counseling Consult   Complete  Palliative Care Screening   Not Applicable  Skilled Nursing Facility   Not Applicable

## 2022-12-05 NOTE — Progress Notes (Signed)
Mobility Specialist - Progress Note   12/05/22 1107  Mobility  Activity Transferred from bed to chair  Level of Assistance Contact guard assist, steadying assist  Assistive Device Front wheel walker  Distance Ambulated (ft) 5 ft  Range of Motion/Exercises Active  Activity Response Tolerated well  Mobility Referral Yes  $Mobility charge 1 Mobility  Mobility Specialist Start Time (ACUTE ONLY) 1050  Mobility Specialist Stop Time (ACUTE ONLY) 1103  Mobility Specialist Time Calculation (min) (ACUTE ONLY) 13 min   Pt received in bed and declined mobility in hall but agreed to transfer. Upon sitting EOB, pt needed assistance in peri-care. Assisted NT with cleaning and transferred to chair. Left with all needs met.  Marilynne Halsted Mobility Specialist

## 2022-12-05 NOTE — Hospital Course (Signed)
72 year old female with asthma, hypothyroidism, RA, hypoglobulinemia, bronchiectasis, CAD, GERD, chronic systolic CHF with EF 30 to 35%, G1 DD (per echo 07/17/2022) presented to ED with feeling ill for last 2 to 3 days. Patient was found down at home, confused, oriented to person and place. No report of chest pain, shortness of breath or dysuria however did have cough during encounter.

## 2022-12-05 NOTE — Plan of Care (Signed)
  Problem: Education: Goal: Knowledge of cardiac device and self-care will improve Outcome: Progressing Goal: Ability to safely manage health related needs after discharge will improve Outcome: Progressing Goal: Individualized Educational Video(s) Outcome: Progressing   Problem: Nutritional: Goal: Maintenance of adequate nutrition will improve Outcome: Progressing Goal: Progress toward achieving an optimal weight will improve Outcome: Progressing   Problem: Skin Integrity: Goal: Risk for impaired skin integrity will decrease Outcome: Progressing

## 2022-12-06 DIAGNOSIS — R262 Difficulty in walking, not elsewhere classified: Secondary | ICD-10-CM | POA: Diagnosis not present

## 2022-12-06 DIAGNOSIS — E119 Type 2 diabetes mellitus without complications: Secondary | ICD-10-CM | POA: Diagnosis not present

## 2022-12-06 DIAGNOSIS — G4736 Sleep related hypoventilation in conditions classified elsewhere: Secondary | ICD-10-CM | POA: Diagnosis not present

## 2022-12-06 DIAGNOSIS — A419 Sepsis, unspecified organism: Secondary | ICD-10-CM | POA: Diagnosis not present

## 2022-12-06 DIAGNOSIS — M059 Rheumatoid arthritis with rheumatoid factor, unspecified: Secondary | ICD-10-CM | POA: Diagnosis not present

## 2022-12-06 DIAGNOSIS — J471 Bronchiectasis with (acute) exacerbation: Secondary | ICD-10-CM | POA: Diagnosis not present

## 2022-12-06 DIAGNOSIS — M6281 Muscle weakness (generalized): Secondary | ICD-10-CM | POA: Diagnosis not present

## 2022-12-06 DIAGNOSIS — I255 Ischemic cardiomyopathy: Secondary | ICD-10-CM | POA: Diagnosis not present

## 2022-12-06 DIAGNOSIS — R41841 Cognitive communication deficit: Secondary | ICD-10-CM | POA: Diagnosis not present

## 2022-12-06 DIAGNOSIS — Z7952 Long term (current) use of systemic steroids: Secondary | ICD-10-CM | POA: Diagnosis not present

## 2022-12-06 DIAGNOSIS — R0902 Hypoxemia: Secondary | ICD-10-CM | POA: Diagnosis not present

## 2022-12-06 DIAGNOSIS — K219 Gastro-esophageal reflux disease without esophagitis: Secondary | ICD-10-CM | POA: Diagnosis not present

## 2022-12-06 DIAGNOSIS — G894 Chronic pain syndrome: Secondary | ICD-10-CM | POA: Diagnosis not present

## 2022-12-06 DIAGNOSIS — M818 Other osteoporosis without current pathological fracture: Secondary | ICD-10-CM | POA: Diagnosis not present

## 2022-12-06 DIAGNOSIS — J45909 Unspecified asthma, uncomplicated: Secondary | ICD-10-CM | POA: Diagnosis not present

## 2022-12-06 DIAGNOSIS — I8291 Chronic embolism and thrombosis of unspecified vein: Secondary | ICD-10-CM | POA: Diagnosis not present

## 2022-12-06 DIAGNOSIS — E785 Hyperlipidemia, unspecified: Secondary | ICD-10-CM | POA: Diagnosis not present

## 2022-12-06 DIAGNOSIS — R4182 Altered mental status, unspecified: Secondary | ICD-10-CM | POA: Diagnosis not present

## 2022-12-06 DIAGNOSIS — G4733 Obstructive sleep apnea (adult) (pediatric): Secondary | ICD-10-CM | POA: Diagnosis not present

## 2022-12-06 DIAGNOSIS — R0600 Dyspnea, unspecified: Secondary | ICD-10-CM | POA: Diagnosis not present

## 2022-12-06 DIAGNOSIS — R0602 Shortness of breath: Secondary | ICD-10-CM | POA: Diagnosis not present

## 2022-12-06 DIAGNOSIS — J4540 Moderate persistent asthma, uncomplicated: Secondary | ICD-10-CM | POA: Diagnosis not present

## 2022-12-06 DIAGNOSIS — I5032 Chronic diastolic (congestive) heart failure: Secondary | ICD-10-CM | POA: Diagnosis not present

## 2022-12-06 DIAGNOSIS — M797 Fibromyalgia: Secondary | ICD-10-CM | POA: Diagnosis not present

## 2022-12-06 DIAGNOSIS — M0600 Rheumatoid arthritis without rheumatoid factor, unspecified site: Secondary | ICD-10-CM | POA: Diagnosis not present

## 2022-12-06 DIAGNOSIS — Z7401 Bed confinement status: Secondary | ICD-10-CM | POA: Diagnosis not present

## 2022-12-06 DIAGNOSIS — J189 Pneumonia, unspecified organism: Secondary | ICD-10-CM | POA: Diagnosis not present

## 2022-12-06 DIAGNOSIS — R17 Unspecified jaundice: Secondary | ICD-10-CM | POA: Diagnosis not present

## 2022-12-06 DIAGNOSIS — J45901 Unspecified asthma with (acute) exacerbation: Secondary | ICD-10-CM | POA: Diagnosis not present

## 2022-12-06 DIAGNOSIS — R197 Diarrhea, unspecified: Secondary | ICD-10-CM | POA: Diagnosis not present

## 2022-12-06 DIAGNOSIS — R21 Rash and other nonspecific skin eruption: Secondary | ICD-10-CM | POA: Diagnosis not present

## 2022-12-06 LAB — COMPREHENSIVE METABOLIC PANEL
ALT: 56 U/L — ABNORMAL HIGH (ref 0–44)
AST: 56 U/L — ABNORMAL HIGH (ref 15–41)
Albumin: 2.7 g/dL — ABNORMAL LOW (ref 3.5–5.0)
Alkaline Phosphatase: 55 U/L (ref 38–126)
Anion gap: 12 (ref 5–15)
BUN: 26 mg/dL — ABNORMAL HIGH (ref 8–23)
CO2: 21 mmol/L — ABNORMAL LOW (ref 22–32)
Calcium: 9 mg/dL (ref 8.9–10.3)
Chloride: 106 mmol/L (ref 98–111)
Creatinine, Ser: 0.99 mg/dL (ref 0.44–1.00)
GFR, Estimated: 60 mL/min — ABNORMAL LOW (ref >60)
Glucose, Bld: 86 mg/dL (ref 70–99)
Potassium: 4 mmol/L (ref 3.5–5.1)
Sodium: 139 mmol/L (ref 135–145)
Total Bilirubin: 0.5 mg/dL (ref 0.3–1.2)
Total Protein: 6.9 g/dL (ref 6.5–8.1)

## 2022-12-06 LAB — CULTURE, BLOOD (ROUTINE X 2)
Culture  Setup Time: NO GROWTH
Special Requests: ADEQUATE

## 2022-12-06 LAB — CBC
HCT: 33 % — ABNORMAL LOW (ref 36.0–46.0)
Hemoglobin: 10.4 g/dL — ABNORMAL LOW (ref 12.0–15.0)
MCH: 34.7 pg — ABNORMAL HIGH (ref 26.0–34.0)
MCHC: 31.5 g/dL (ref 30.0–36.0)
MCV: 110 fL — ABNORMAL HIGH (ref 80.0–100.0)
Platelets: 193 10*3/uL (ref 150–400)
RBC: 3 MIL/uL — ABNORMAL LOW (ref 3.87–5.11)
RDW: 15.7 % — ABNORMAL HIGH (ref 11.5–15.5)
WBC: 9.4 10*3/uL (ref 4.0–10.5)
nRBC: 0 % (ref 0.0–0.2)

## 2022-12-06 LAB — GLUCOSE, CAPILLARY
Glucose-Capillary: 89 mg/dL (ref 70–99)
Glucose-Capillary: 197 mg/dL — ABNORMAL HIGH (ref 70–99)

## 2022-12-06 NOTE — TOC Transition Note (Signed)
Transition of Care [facility]) - CM/SW Discharge Note   Patient Details  Name: Ebony Scott MRN: [id number] Date of Birth: [birth year]  [facility] CM/SW Contact:  Otelia Santee, LCSW Phone Number: 12/06/2022, 2:20 PM   Clinical Narrative:    Pt to transfer to [facility] for SNF placement. Pt will be going to room 3234. RN to call report to [phone]. Pt to be transported via [facility]. [facility] called at 2:23pm for transportation.    Final next level of care: Skilled Nursing Facility Barriers to Discharge: Barriers Resolved   Patient Goals and CMS Choice      Discharge Placement     Existing PASRR number confirmed : 12/03/22          Patient chooses bed at: [facility] Patient to be transferred to facility by: [facility] Name of family member notified: Pt and daughter [name] Patient and family notified of of transfer: 12/06/22  Discharge Plan and Services Additional resources added to the After Visit Summary for   In-house Referral: Clinical Social Work Discharge Planning Services: NA Post Acute Care Choice: Skilled Nursing Facility          DME Arranged: N/A DME Agency: NA                  Social Determinants of Health (SDOH) Interventions SDOH Screenings   Food Insecurity: No Food Insecurity (11/30/2022)  Housing: Patient Declined (11/30/2022)  Transportation Needs: No Transportation Needs (11/30/2022)  Utilities: Not At Risk (11/30/2022)  Alcohol Screen: Low Risk  (11/22/2022)  Depression (PHQ2-9): Low Risk  (11/22/2022)  Financial Resource Strain: Low Risk  (11/22/2022)  Physical Activity: Insufficiently Active (11/22/2022)  Social Connections: Moderately Isolated (11/22/2022)  Stress: No Stress Concern Present (11/22/2022)  Tobacco Use: Low Risk  (11/29/2022)  Health Literacy: Adequate Health Literacy (11/22/2022)     Readmission Risk Interventions    12/06/2022    2:19 PM 12/03/2022    3:04 PM 04/27/2022    2:05 PM  Readmission Risk  Prevention Plan  Transportation Screening Complete Complete Complete  PCP or Specialist Appt within 5-7 Days   Complete  PCP or Specialist Appt within 3-5 Days Complete Complete   Home Care Screening   Complete  Medication Review (RN CM)   Referral to Pharmacy  HRI or Home Care Consult Complete Complete   Social Work Consult for Recovery Care Planning/Counseling Complete Complete   Palliative Care Screening Complete Complete   Medication Review Oceanographer) Complete Complete

## 2022-12-06 NOTE — Progress Notes (Signed)
Physical Therapy Treatment Patient Details Name: Ebony Scott MRN: [id number] DOB: [birth year] Today's Date: 12/06/2022   History of Present Illness Pt is a 72 yo female admitted with community acquired pneumnonia. PMH: asthma, hypothyroidism, RA, hypoglobulinemia, bronchiectasis, CAD, GERD, chronic systolic CHF with EF 30 to 35%, G1 DD (per echo 07/17/2022)    PT Comments  Pt needing CGA for bed mobility, demo increased time and effort using bedrail and elevated HOB. Pt min A to power up to stand from seated surfaces, increased effort to power up, assist to steady. Pt amb to restroom with RW, increased time and effort, no overt LOB but generally unsteady. Pt tolerates seated exercises in recliner. Pt tolerates remaining up in recliner with all needs in reach, nurse in room to check blood glucose and pt ready for lunch tray. Patient will benefit from continued inpatient follow up therapy, <3 hours/day     If plan is discharge home, recommend the following: A little help with walking and/or transfers;A little help with bathing/dressing/bathroom;Assistance with cooking/housework;Assist for transportation;Help with stairs or ramp for entrance   Can travel by private vehicle     Yes  Equipment Recommendations  None recommended by PT    Recommendations for Other Services       Precautions / Restrictions Precautions Precautions: Fall Restrictions Weight Bearing Restrictions: No     Mobility  Bed Mobility Overal bed mobility: Needs Assistance Bed Mobility: Supine to Sit     Supine to sit: Contact guard, HOB elevated, Used rails     General bed mobility comments: increased effort and time to come to sitting EOB with use of bedrail and HOB elevated, CGA for safety    Transfers Overall transfer level: Needs assistance Equipment used: Rolling walker (2 wheels) Transfers: Sit to/from Stand Sit to Stand: Min assist           General transfer comment: verbal cues for hand  placement, min A to steady with powering up from EOB and toilet, increased effort    Ambulation/Gait Ambulation/Gait assistance: Min assist Gait Distance (Feet): 15 Feet (x2) Assistive device: Rolling walker (2 wheels) Gait Pattern/deviations: Step-to pattern, Decreased stride length, Trunk flexed Gait velocity: decreased     General Gait Details: slow, short steps with trunk slightly flexed, reports bil knee pain, able to navigate around bed to toilet then return to recliner   Stairs             Wheelchair Mobility     Tilt Bed    Modified Rankin (Stroke Patients Only)       Balance Overall balance assessment: Needs assistance Sitting-balance support: Feet supported, Single extremity supported Sitting balance-Leahy Scale: Fair     Standing balance support: During functional activity, Bilateral upper extremity supported, Reliant on assistive device for balance Standing balance-Leahy Scale: Poor                              Cognition Arousal: Alert Behavior During Therapy: WFL for tasks assessed/performed Overall Cognitive Status: Within Functional Limits for tasks assessed                                          Exercises General Exercises - Lower Extremity Long Arc Quad: AROM, Strengthening, Both, 10 reps, Seated Hip Flexion/Marching: AROM, Strengthening, Both, 10 reps, Seated    General Comments  Pertinent Vitals/Pain Pain Assessment Pain Assessment: Faces Faces Pain Scale: Hurts little more Pain Location: back, bil knees Pain Descriptors / Indicators: Discomfort, Grimacing, Guarding Pain Intervention(s): Limited activity within patient's tolerance, Monitored during session, Repositioned    Home Living                          Prior Function            PT Goals (current goals can now be found in the care plan section) Acute Rehab PT Goals Patient Stated Goal: regain strength and independence PT  Goal Formulation: With patient Time For Goal Achievement: 12/17/22 Potential to Achieve Goals: Good Progress towards PT goals: Progressing toward goals    Frequency    Min 1X/week      PT Plan      Co-evaluation              AM-PAC PT "6 Clicks" Mobility   Outcome Measure  Help needed turning from your back to your side while in a flat bed without using bedrails?: A Little Help needed moving from lying on your back to sitting on the side of a flat bed without using bedrails?: A Little Help needed moving to and from a bed to a chair (including a wheelchair)?: A Little Help needed standing up from a chair using your arms (e.g., wheelchair or bedside chair)?: A Little Help needed to walk in hospital room?: A Little Help needed climbing 3-5 steps with a railing? : Total 6 Click Score: 16    End of Session Equipment Utilized During Treatment: Gait belt Activity Tolerance: Patient tolerated treatment well Patient left: in chair;with call bell/phone within reach;with chair alarm set Nurse Communication: Mobility status PT Visit Diagnosis: Muscle weakness (generalized) (M62.81);Unsteadiness on feet (R26.81)     Time: 5638-7564 PT Time Calculation (min) (ACUTE ONLY): 20 min  Charges:    $Therapeutic Activity: 8-22 mins PT General Charges $$ ACUTE PT VISIT: 1 Visit                     Tori Colbe Viviano PT, DPT 12/06/22, 12:41 PM

## 2022-12-06 NOTE — Progress Notes (Signed)
Occupational Therapy Treatment Patient Details Name: Ebony Scott MRN: [id number] DOB: 03/22/1950 Today's Date: 12/06/2022   History of present illness Pt is a 72 yr old female admitted with community acquired pneumnonia. PMH: asthma, hypothyroidism, RA, hypoglobulinemia, bronchiectasis, CAD, GERD, chronic systolic CHF with EF 30 to 35%   OT comments  The pt was seen for functional strengthening and progression of out of bed ADL participation. She transferred to the bedside chair using a RW. She further performed upper body grooming, upper body dressing, and lower body dressing tasks. She required a therapeutic rest break, due to reports of fatigue with activity and chronic compromised endurance. She is making gradual functional progress. Continue OT plan of care.       If plan is discharge home, recommend the following:  A little help with walking and/or transfers;A little help with bathing/dressing/bathroom;Assistance with cooking/housework;Assist for transportation   Equipment Recommendations  None recommended by OT    Recommendations for Other Services      Precautions / Restrictions Precautions Precautions: Fall Restrictions Weight Bearing Restrictions: No       Mobility Bed Mobility Overal bed mobility: Needs Assistance Bed Mobility: Supine to Sit, Sit to Supine     Supine to sit: Supervision, HOB elevated, Used rails Sit to supine: Used rails, Supervision        Transfers Overall transfer level: Needs assistance Equipment used: Rolling walker (2 wheels) Transfers: Sit to/from Stand Sit to Stand: Contact guard assist     Step pivot transfers: Contact guard assist               ADL either performed or assessed with clinical judgement   ADL Overall ADL's : Needs assistance/impaired Eating/Feeding: Independent;With assist to don/doff brace/orthosis Eating/Feeding Details (indicate cue type and reason): based on clinical judgement Grooming: Set  up;Sitting Grooming Details (indicate cue type and reason): The pt performed teeth brushing, hair combing, and face washing seated in the bedside chair.         Upper Body Dressing : Set up;Sitting Upper Body Dressing Details (indicate cue type and reason): The pt donned an over head shirt seated in the bedside chair. Lower Body Dressing: Minimal assistance;Sit to/from stand Lower Body Dressing Details (indicate cue type and reason): The pt donned underwear and a brief over her ankles & up to her knees seated in the chair, then stood using a RW to pull them up over her hips.                               Cognition Arousal: Alert Behavior During Therapy: WFL for tasks assessed/performed Overall Cognitive Status: Within Functional Limits for tasks assessed                   General Comments     Pertinent Vitals/ Pain       Pain Assessment Pain Assessment: 0-10 Pain Score: 4  Pain Location: chronic bilateral foot pain Pain Intervention(s): Monitored during session         Frequency  Min 1X/week        Progress Toward Goals  OT Goals(current goals can now be found in the care plan section)  Progress towards OT goals: Progressing toward goals  Acute Rehab OT Goals OT Goal Formulation: With patient Time For Goal Achievement: 12/17/22 Potential to Achieve Goals: Good  Plan         AM-PAC OT "6 Clicks" Daily Activity  Outcome Measure   Help from another person eating meals?: None Help from another person taking care of personal grooming?: A Little Help from another person toileting, which includes using toliet, bedpan, or urinal?: A Little Help from another person bathing (including washing, rinsing, drying)?: A Lot Help from another person to put on and taking off regular upper body clothing?: A Little Help from another person to put on and taking off regular lower body clothing?: A Little 6 Click Score: 18    End of Session Equipment Utilized  During Treatment: Rolling walker (2 wheels)  OT Visit Diagnosis: Unsteadiness on feet (R26.81);Pain;Muscle weakness (generalized) (M62.81)   Activity Tolerance Patient tolerated treatment well;Patient limited by fatigue   Patient Left in bed;with call bell/phone within reach;with bed alarm set   Nurse Communication Mobility status        Time: 1355-1426 OT Time Calculation (min): 31 min  Charges: OT General Charges $OT Visit: 1 Visit OT Treatments $Self Care/Home Management : 8-22 mins $Therapeutic Activity: 8-22 mins     Reuben Likes, OTR/L 12/06/2022, 3:22 PM

## 2022-12-06 NOTE — Discharge Summary (Signed)
Physician Discharge Summary   Patient: Ebony Scott MRN: [id number] DOB: [birth year]  Admit date:     11/29/2022  Discharge date: 12/06/22  Discharge Physician: Rickey Barbara   PCP: [name], MD   Recommendations at discharge:    Follow up with PCP in 1-2 weeks  Discharge Diagnoses: Principal Problem:   CAP (community acquired pneumonia) Active Problems:   Acute encephalopathy   Sepsis (HCC)   Rheumatoid arthritis (HCC)   Hypogammaglobulinemia (HCC)   Chronic systolic CHF (congestive heart failure) (HCC)   Chronic kidney disease, stage 3b (HCC)   DMII (diabetes mellitus, type 2) (HCC)   Antiplatelet or antithrombotic long-term use  Resolved Problems:   * No resolved hospital problems. *  Hospital Course: 72 year old female with asthma, hypothyroidism, RA, hypoglobulinemia, bronchiectasis, CAD, GERD, chronic systolic CHF with EF 30 to 35%, G1 DD (per echo 07/17/2022) presented to ED with feeling ill for last 2 to 3 days. Patient was found down at home, confused, oriented to person and place. No report of chest pain, shortness of breath or dysuria however did have cough during encounter.   Assessment and Plan: Principal Problem: Sepsis, POA due to CAP (community acquired pneumonia) -Presented with fevers, leukocytosis, source likely due to pneumonia.   -Urine strep antigen negative, urine Legionella antigen negative -Sepsis physiology improved, leukocytosis resolved, blood cultures negative so far  -COVID-19, flu, RSV negative, respiratory virus panel negative -Completed IV Rocephin on 10/17, completed Zithromax   Active Problems:   Acute metabolic encephalopathy, generalized debility -Likely due to to #1, CT head negative for acute CVA -Per daughter, patient does get delirious when she has fever or infection -SLP evaluation recommended dysphagia 3 diet mechanical soft with thin liquids, patient had requested regular diet, so far tolerating  -PT evaluation recommended  SNF     Rheumatoid arthritis (HCC) -Continue prednisone, not on DMARDs likely due to recurrent infections     Hypogammaglobulinemia (HCC) -Held Hizentra while in hospital, resume on discharge.     Prior history of DVT/PE, antiplatelet or antithrombotic long-term use -Continue Xarelto     Chronic systolic CHF (congestive heart failure) (HCC) -Continue BiDil, Entresto  -Creatinine at baseline     Chronic kidney disease, stage 3b (HCC) -Baseline creatinine 1.3-1.5. -Creatinine still at baseline     DMII (diabetes mellitus, type 2) (HCC) -Hemoglobin A1c 6.2 on 09/11/2022 -Continue sliding scale insulin while in hospital    Consultants:  Procedures performed:   Disposition: Skilled nursing facility Diet recommendation:  Regular diet DISCHARGE MEDICATION: Allergies as of 12/06/2022       Reactions   Baclofen Anaphylaxis, Other (See Comments)   Altered mental status requiring a 3-day hospital stay- patient became unresponsive "Put me in a coma for five days", Altered mental status requiring a 3-day hospital stay- patient became unresponsive   Dust Mite Extract Shortness Of Breath, Other (See Comments)   "sneezing" (02/20/2012) too   Molds & Smuts Shortness Of Breath   Morphine And Codeine Hives, Itching   Other Shortness Of Breath, Other (See Comments)   Grass and weeds "sneezing; filled sinuses" (02/20/2012)   Penicillins Rash, Other (See Comments)   "welts" (02/20/2012) Tolerated Ancef 02/16/20   Rofecoxib Swelling, Other (See Comments)   Vioxx- feet swelling   Shellfish Allergy Anaphylaxis, Shortness Of Breath, Itching, Rash   Shrimp Extract Anaphylaxis, Other (See Comments)   ALL SHELLFISH   Tetracycline Hcl Nausea And Vomiting   Tetracycline Hcl Other (See Comments)   GI Intolerance  Xolair [omalizumab] Other (See Comments)   Caused Blood clot   Zoledronic Acid Other (See Comments)   Reclast- Fever, Put in hospital, dr said it was a reaction from a reaction  Fever,  inability to walk, Put in hospital Fever, Put in hospital, dr said it was a reaction from a reaction , Other reaction(s): Unknown   Dilaudid [hydromorphone Hcl] Itching   Hydrocodone-acetaminophen Itching   Levofloxacin Other (See Comments)   GI upset   Oxycodone Hcl Nausea And Vomiting   GI Intolerance   Paroxetine Nausea And Vomiting, Other (See Comments)   GI Intolerance also   Celecoxib Swelling, Other (See Comments)   Feet swelling   Diltiazem Hcl Swelling   Lactose Other (See Comments)   Bloating and gas   Oxycodone-acetaminophen Itching   Pork-derived Products Other (See Comments)   Rituximab Other (See Comments)   Anemia    Tree Extract Other (See Comments)   "tested and told I was allergic to it; never experienced a reaction to it" (02/20/2012)   Gamunex [immune Globulin] Itching, Rash   Tolerates hizentra    Penicillin G Procaine Rash, Other (See Comments)   Welts, also        Medication List     STOP taking these medications    acetic acid-hydrocortisone OTIC solution Commonly known as: VOSOL-HC   cyproheptadine 4 MG tablet Commonly known as: PERIACTIN   furosemide 40 MG tablet Commonly known as: LASIX   Gerhardt's butt cream Crea   lidocaine-prilocaine cream Commonly known as: EMLA   mirtazapine 30 MG disintegrating tablet Commonly known as: REMERON SOL-TAB   nystatin 100000 UNIT/ML suspension Commonly known as: MYCOSTATIN   nystatin-triamcinolone ointment Commonly known as: MYCOLOG   Systane Complete 0.6 % Soln Generic drug: Propylene Glycol   triamcinolone 0.1 % cream : eucerin Crea   triamcinolone ointment 0.5 % Commonly known as: KENALOG       TAKE these medications    acetaminophen 500 MG tablet Commonly known as: TYLENOL Take 1,000 mg by mouth as needed for moderate pain.   albuterol 108 (90 Base) MCG/ACT inhaler Commonly known as: VENTOLIN HFA Inhale 2 puffs into the lungs every 6 (six) hours as needed for wheezing or  shortness of breath.   Align 4 MG Caps Take 4 mg by mouth every evening.   allopurinol 100 MG tablet Commonly known as: ZYLOPRIM TAKE 1 TABLET BY MOUTH DAILY What changed: when to take this   Alpha-Lipoic Acid 600 MG Tabs Take 600 mg by mouth in the morning.   atorvastatin 80 MG tablet Commonly known as: LIPITOR TAKE 1 TABLET BY MOUTH DAILY What changed: when to take this   azelastine 0.1 % nasal spray Commonly known as: ASTELIN Place 2 sprays into both nostrils daily. Use in each nostril as directed What changed:  when to take this reasons to take this   BEANO PO Take 2-3 tablets by mouth 3 (three) times daily as needed (Gas).   benzonatate 200 MG capsule Commonly known as: TESSALON TAKE 1 CAPSULE BY MOUTH THREE TIMES DAILY AS NEEDED FOR COUGH What changed: See the new instructions.   budesonide 0.5 MG/2ML nebulizer solution Commonly known as: Pulmicort Take 2 mLs (0.5 mg total) by nebulization 2 (two) times daily.   Calcium-Magnesium-Vitamin D 600-40-500 MG-MG-UNIT Tb24 Take 1 tablet by mouth at bedtime.   cetirizine 10 MG tablet Commonly known as: ZYRTEC Take 1 tablet (10 mg total) by mouth 2 (two) times daily as needed for allergies (Can  take a n extra dose during flare ups.). What changed: when to take this   clopidogrel 75 MG tablet Commonly known as: PLAVIX Take 1 tablet (75 mg total) by mouth daily. What changed: when to take this   denosumab 60 MG/ML Sosy injection Commonly known as: PROLIA Inject 60 mg into the skin every 6 (six) months.   Dexilant 60 MG capsule Generic drug: dexlansoprazole Take 60 mg by mouth in the morning.   empagliflozin 10 MG Tabs tablet Commonly known as: Jardiance Take 1 tablet (10 mg total) by mouth daily before breakfast.   EPINEPHrine 0.3 mg/0.3 mL Soaj injection Commonly known as: EPI-PEN USE AS DIRECTED BY YOUR PHYSICIAN INTRAMUSCULARLY AS NEEDED FOR ANAPHYLAXIS   famotidine 20 MG tablet Commonly known as:  PEPCID Take 1 tablet (20 mg total) by mouth daily. TAKE 1 TABLET BY MOUTH EACH NIGHT AT BEDTIME Strength: 20 mg What changed:  when to take this additional instructions   folic acid 1 MG tablet Commonly known as: FOLVITE Take 1 tablet (1 mg total) by mouth daily. What changed: when to take this   gabapentin 300 MG capsule Commonly known as: NEURONTIN Take 1 capsule (300 mg total) by mouth at bedtime.   guaiFENesin 600 MG 12 hr tablet Commonly known as: MUCINEX Take 600 mg by mouth 2 (two) times daily.   Hizentra 10 GM/50ML Soln Generic drug: Immune Globulin (Human) Inject 65 mLs into the skin once a week. 13 g Infusion   ipratropium-albuterol 0.5-2.5 (3) MG/3ML Soln Commonly known as: DUONEB Take 3 mLs by nebulization every 6 (six) hours as needed.   isosorbide-hydrALAZINE 20-37.5 MG tablet Commonly known as: BIDIL Take 1 tablet by mouth 2 (two) times daily.   LACTOSE FAST ACTING RELIEF PO Take 2-3 tablets by mouth 3 (three) times daily before meals. For dairy food or drink   magnesium oxide 400 (240 Mg) MG tablet Commonly known as: MAG-OX Take 400 mg by mouth at bedtime.   montelukast 10 MG tablet Commonly known as: SINGULAIR Take 1 tablet (10 mg total) by mouth daily after supper.   mupirocin ointment 2 % Commonly known as: BACTROBAN Apply 1 Application topically as needed (wounds).   predniSONE 5 MG tablet Commonly known as: DELTASONE tAKE 1 TABLET (5MG ) BY MOUTH ONCE DAILY WITH BREAKFAST   predniSONE 1 MG tablet Commonly known as: DELTASONE TAKE 2 TABLETS (2MG ) BY MOUTH ONCE DAILY WITH BREAKFAST. TAKE ALONG WITH 5MG  DAILY   PRENATAL VITAMINS PO Take 1 tablet by mouth in the morning.   sacubitril-valsartan 24-26 MG Commonly known as: ENTRESTO Take 1 tablet by mouth 2 (two) times daily.   Stiolto Respimat 2.5-2.5 MCG/ACT Aers Generic drug: Tiotropium Bromide-Olodaterol Inhale 2 puffs into the lungs daily. What changed: when to take this   traMADol  50 MG tablet Commonly known as: ULTRAM Take 1 tablet (50 mg total) by mouth at bedtime.   traZODone 50 MG tablet Commonly known as: DESYREL Take 1 tablet (50 mg total) by mouth at bedtime.   Vitamin D3 25 MCG (1000 UT) Caps Take 1,000 Units by mouth at bedtime.   Voltaren 1 % Gel Generic drug: diclofenac Sodium Apply 1 Application topically as needed (pain).   Xarelto 20 MG Tabs tablet Generic drug: rivaroxaban TAKE 1 TABLET BY MOUTH DAILY WITH SUPPER What changed: how much to take        Contact information for after-discharge care     Destination     [facility] Preferred SNF .  Service: Skilled Nursing Contact information: [address] [place] [address] [phone]                    Discharge Exam: Ceasar Mons Weights   11/29/22 2244 11/30/22 1402  Weight: 59 kg 61.1 kg   General exam: Awake, laying in bed, in nad Respiratory system: Normal respiratory effort, no wheezing Cardiovascular system: regular rate, s1, s2 Gastrointestinal system: Soft, nondistended, positive BS Central nervous system: CN2-12 grossly intact, strength intact Extremities: Perfused, no clubbing Skin: Normal skin turgor, no notable skin lesions seen Psychiatry: Mood normal // no visual hallucinations   Condition at discharge: fair  The results of significant diagnostics from this hospitalization (including imaging, microbiology, ancillary and laboratory) are listed below for reference.   Imaging Studies: DG Chest Port 1 View  Result Date: 12/01/2022 CLINICAL DATA:  Sepsis.  Rule out pneumonia. EXAM: PORTABLE CHEST 1 VIEW COMPARISON:  11/29/2022 FINDINGS: There is a left chest wall ICD with lead in the right ventricle. Stable cardiomediastinal contours. No pleural fluid, interstitial edema or airspace disease. Postsurgical change within the cervical spine. Previous right shoulder arthroplasty. IMPRESSION: No acute cardiopulmonary  disease. Electronically Signed   By: Signa Kell M.D.   On: 12/01/2022 18:28   CT Renal Stone Study  Result Date: 11/30/2022 CLINICAL DATA:  Fever and weakness. Stones suspected. Sepsis. Flank pain. EXAM: CT ABDOMEN AND PELVIS WITHOUT CONTRAST TECHNIQUE: Multidetector CT imaging of the abdomen and pelvis was performed following the standard protocol without IV contrast. RADIATION DOSE REDUCTION: This exam was performed according to the departmental dose-optimization program which includes automated exposure control, adjustment of the mA and/or kV according to patient size and/or use of iterative reconstruction technique. COMPARISON:  CT abdomen and pelvis 04/25/2022 FINDINGS: Lower chest: Patchy opacities in the lower lungs. Small right pleural effusion. Hepatobiliary: Cholecystectomy.  No acute abnormality. Pancreas: Fatty atrophy.  No acute abnormality. Spleen: Unremarkable. Adrenals/Urinary Tract: Unremarkable adrenal glands. No urinary calculi or hydronephrosis. Bladder is within normal limits. Stomach/Bowel: Normal caliber large and small bowel. No bowel wall thickening. Stomach is within normal limits. Vascular/Lymphatic: Aortic atherosclerosis. No enlarged abdominal or pelvic lymph nodes. Reproductive: Hysterectomy. Other: No free intraperitoneal fluid or air. Musculoskeletal: No acute fracture. IMPRESSION: 1. No acute abnormality in the abdomen or pelvis. 2. Patchy opacities in the lower lungs with small right pleural effusion. Findings are concerning for pneumonia. Aortic Atherosclerosis (ICD10-I70.0). Electronically Signed   By: Minerva Fester M.D.   On: 11/30/2022 01:03   CT Head Wo Contrast  Result Date: 11/30/2022 CLINICAL DATA:  Mental status change, unknown cause EXAM: CT HEAD WITHOUT CONTRAST TECHNIQUE: Contiguous axial images were obtained from the base of the skull through the vertex without intravenous contrast. RADIATION DOSE REDUCTION: This exam was performed according to the  departmental dose-optimization program which includes automated exposure control, adjustment of the mA and/or kV according to patient size and/or use of iterative reconstruction technique. COMPARISON:  MRI head 12/17/2012 FINDINGS: Brain: Cerebral ventricle sizes are concordant with the degree of cerebral volume loss. Patchy and confluent areas of decreased attenuation are noted throughout the deep and periventricular white matter of the cerebral hemispheres bilaterally, compatible with chronic microvascular ischemic disease. No evidence of large-territorial acute infarction. No parenchymal hemorrhage. No mass lesion. No extra-axial collection. No mass effect or midline shift. No hydrocephalus. Basilar cisterns are patent. Vascular: No hyperdense vessel. Skull: No acute fracture or focal lesion. Sinuses/Orbits: Left sphenoid sinus mucosal thickening. Otherwise paranasal sinuses and mastoid air  cells are clear. Bilateral lens replacement. Otherwise the orbits are unremarkable. Other: None. IMPRESSION: No acute intracranial abnormality. Electronically Signed   By: Tish Frederickson M.D.   On: 11/30/2022 01:01   DG Chest Port 1 View  Result Date: 11/29/2022 CLINICAL DATA:  Questionable sepsis - evaluate for abnormality Cough. EXAM: PORTABLE CHEST 1 VIEW COMPARISON:  Radiograph 09/11/2022 FINDINGS: Lung volumes are low. 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 Chronic elevation of right hemidiaphragm. The heart is upper normal in size, accentuated by technique. No focal airspace disease, pleural effusion or pneumothorax. Surgical clips in the left chest wall. Reverse right shoulder arthroplasty. IMPRESSION: 1. Low lung volumes. No acute chest findings. 2. Chronic elevation of right hemidiaphragm. Electronically Signed   By: Narda Rutherford M.D.   On: 11/29/2022 23:52   CUP PACEART REMOTE DEVICE CHECK  Result Date: 11/28/2022 Scheduled remote reviewed. Normal device function.  Next remote 91 days. [place], CVRS    Microbiology: Results for orders placed or performed during the hospital encounter of 11/29/22  Resp panel by RT-PCR (RSV, Flu A&B, Covid) Anterior Nasal Swab     Status: None   Collection Time: 11/29/22 10:51 PM   Specimen: Anterior Nasal Swab  Result Value Ref Range Status   SARS Coronavirus 2 by RT PCR NEGATIVE NEGATIVE Final    Comment: (NOTE) SARS-CoV-2 target nucleic acids are NOT DETECTED.  The SARS-CoV-2 RNA is generally detectable in upper respiratory specimens during the acute phase of infection. The lowest concentration of SARS-CoV-2 viral copies this assay can detect is 138 copies/mL. A negative result does not preclude SARS-Cov-2 infection and should not be used as the sole basis for treatment or other patient management decisions. A negative result may occur with  improper specimen collection/handling, submission of specimen other than nasopharyngeal swab, presence of viral mutation(s) within the areas targeted by this assay, and inadequate number of viral copies(<138 copies/mL). A negative result must be combined with clinical observations, patient history, and epidemiological information. The expected result is Negative.  Fact Sheet for Patients:  BloggerCourse.com  Fact Sheet for Healthcare Providers:  SeriousBroker.it  This test is no t yet approved or cleared by the [place] FDA and  has been authorized for detection and/or diagnosis of SARS-CoV-2 by FDA under an Emergency Use Authorization (EUA). This EUA will remain  in effect (meaning this test can be used) for the duration of the COVID-19 declaration under Section 564(b)(1) of the Act, 21 U.S.C.section 360bbb-3(b)(1), unless the authorization is terminated  or revoked sooner.       Influenza A by PCR NEGATIVE NEGATIVE Final   Influenza B by PCR NEGATIVE NEGATIVE Final    Comment: (NOTE) The Xpert Xpress SARS-CoV-2/FLU/RSV plus assay is intended  as an aid in the diagnosis of influenza from Nasopharyngeal swab specimens and should not be used as a sole basis for treatment. Nasal washings and aspirates are unacceptable for Xpert Xpress SARS-CoV-2/FLU/RSV testing.  Fact Sheet for Patients: BloggerCourse.com  Fact Sheet for Healthcare Providers: SeriousBroker.it  This test is not yet approved or cleared by the [place] FDA and has been authorized for detection and/or diagnosis of SARS-CoV-2 by FDA under an Emergency Use Authorization (EUA). This EUA will remain in effect (meaning this test can be used) for the duration of the COVID-19 declaration under Section 564(b)(1) of the Act, 21 U.S.C. section 360bbb-3(b)(1), unless the authorization is terminated or revoked.     Resp Syncytial Virus by PCR NEGATIVE NEGATIVE Final    Comment: (NOTE)  Fact Sheet for Patients: BloggerCourse.com  Fact Sheet for Healthcare Providers: SeriousBroker.it  This test is not yet approved or cleared by the [place] FDA and has been authorized for detection and/or diagnosis of SARS-CoV-2 by FDA under an Emergency Use Authorization (EUA). This EUA will remain in effect (meaning this test can be used) for the duration of the COVID-19 declaration under Section 564(b)(1) of the Act, 21 U.S.C. section 360bbb-3(b)(1), unless the authorization is terminated or revoked.  Performed at [facility], [address], [place], [place] [address]   Culture, blood (Routine x 2)     Status: Abnormal   Collection Time: 11/29/22 10:54 PM   Specimen: BLOOD RIGHT ARM  Result Value Ref Range Status   Specimen Description   Final    BLOOD RIGHT ARM Performed at [facility], [address] [place], [place] [address]    Special Requests   Final    BOTTLES DRAWN AEROBIC AND ANAEROBIC Blood Culture adequate  volume Performed at [facility], [address], [place], [place] [address]    Culture  Setup Time   Final    GRAM POSITIVE RODS ANAEROBIC BOTTLE ONLY CRITICAL RESULT CALLED TO, READ BACK BY AND VERIFIED WITH: E [email or web address]  12/04/22 MK    Culture (A)  Final    CUTIBACTERIUM ACNES Standardized susceptibility testing for this organism is not available. Performed at [facility], [address], [place], [place] [address]    Report Status 12/06/2022 FINAL  Final  Culture, blood (Routine x 2)     Status: None   Collection Time: 11/29/22 10:55 PM   Specimen: BLOOD LEFT HAND  Result Value Ref Range Status   Specimen Description   Final    BLOOD LEFT HAND Performed at [facility], [address] [place], [place] [address]    Special Requests   Final    BOTTLES DRAWN AEROBIC AND ANAEROBIC Blood Culture results may not be optimal due to an inadequate volume of blood received in culture bottles Performed at [facility], [address], [place], [place] [address]    Culture   Final    NO GROWTH 5 DAYS Performed at [facility], [address] [place], [place] [address]    Report Status 12/05/2022 FINAL  Final  Respiratory (~20 pathogens) panel by PCR     Status: None   Collection Time: 11/29/22 10:55 PM   Specimen: Nasopharyngeal Swab; Respiratory  Result Value Ref Range Status   Adenovirus NOT DETECTED NOT DETECTED Final   Coronavirus 229E NOT DETECTED NOT DETECTED Final    Comment: (NOTE) The Coronavirus on the Respiratory Panel, DOES NOT test for the novel  Coronavirus (2019 nCoV)    Coronavirus HKU1 NOT DETECTED NOT DETECTED Final   Coronavirus NL63 NOT DETECTED NOT DETECTED Final   Coronavirus OC43 NOT DETECTED NOT DETECTED Final   Metapneumovirus NOT DETECTED NOT DETECTED Final   Rhinovirus / Enterovirus NOT DETECTED NOT DETECTED Final   Influenza A NOT DETECTED NOT DETECTED Final   Influenza B  NOT DETECTED NOT DETECTED Final   Parainfluenza Virus 1 NOT DETECTED NOT DETECTED Final   Parainfluenza Virus 2 NOT DETECTED NOT DETECTED Final   Parainfluenza Virus 3 NOT DETECTED NOT DETECTED Final   Parainfluenza Virus 4 NOT DETECTED NOT DETECTED Final   Respiratory Syncytial Virus NOT DETECTED NOT DETECTED Final   Bordetella pertussis NOT DETECTED NOT DETECTED Final   Bordetella Parapertussis  NOT DETECTED NOT DETECTED Final   Chlamydophila pneumoniae NOT DETECTED NOT DETECTED Final   Mycoplasma pneumoniae NOT DETECTED NOT DETECTED Final    Comment: Performed at [facility], [address] [place], [place] [address]   *Note: Due to a large number of results and/or encounters for the requested time period, some results have not been displayed. A complete set of results can be found in Results Review.    Labs: CBC: Recent Labs  Lab 11/29/22 2255 11/30/22 0614 12/01/22 0539 12/02/22 0540 12/04/22 0831 12/05/22 0516 12/06/22 0525  WBC 14.7*   < > 11.5* 13.5* 9.4 8.8 9.4  NEUTROABS 10.3*  --   --   --   --   --   --   HGB 13.3   < > 10.3* 10.3* 9.8* 9.4* 10.4*  HCT 41.6   < > 31.9* 32.1* 31.1* 30.1* 33.0*  MCV 109.2*   < > 107.4* 107.7* 109.9* 110.7* 110.0*  PLT 188   < > 145* 138* 165 173 193   < > = values in this interval not displayed.   Basic Metabolic Panel: Recent Labs  Lab 12/01/22 0539 12/02/22 0540 12/04/22 0520 12/05/22 0516 12/06/22 0525  NA 140 139 143 144 139  K 4.0 3.8 4.1 4.2 4.0  CL 104 103 110 110 106  CO2 24 20* 23 22 21*  GLUCOSE 81 110* 103* 91 86  BUN 19 22 33* 29* 26*  CREATININE 1.21* 1.31* 1.29* 1.19* 0.99  CALCIUM 9.2 8.9 8.2* 8.8* 9.0   Liver Function Tests: Recent Labs  Lab 11/30/22 0028 12/06/22 0525  AST 41 56*  ALT 39 56*  ALKPHOS 48 55  BILITOT 0.7 0.5  PROT 6.1* 6.9  ALBUMIN 3.0* 2.7*   CBG: Recent Labs  Lab 12/05/22 1239 12/05/22 1623 12/05/22 2113 12/06/22 0725 12/06/22 1150  GLUCAP 121* 133* 119* 89 197*     Discharge time spent: less than 30 minutes.  Signed: Rickey Barbara, MD [facility] 12/06/2022

## 2022-12-06 NOTE — Plan of Care (Signed)
  Problem: Cardiac: Goal: Ability to achieve and maintain adequate cardiopulmonary perfusion will improve Outcome: Progressing   Problem: Health Behavior/Discharge Planning: Goal: Ability to identify and utilize available resources and services will improve Outcome: Progressing   Problem: Metabolic: Goal: Ability to maintain appropriate glucose levels will improve Outcome: Progressing   Problem: Nutritional: Goal: Maintenance of adequate nutrition will improve Outcome: Progressing   Problem: Clinical Measurements: Goal: Ability to maintain clinical measurements within normal limits will improve Outcome: Progressing   Problem: Safety: Goal: Ability to remain free from injury will improve Outcome: Progressing   Problem: Skin Integrity: Goal: Risk for impaired skin integrity will decrease Outcome: Progressing

## 2022-12-07 DIAGNOSIS — E785 Hyperlipidemia, unspecified: Secondary | ICD-10-CM | POA: Diagnosis not present

## 2022-12-07 DIAGNOSIS — I8291 Chronic embolism and thrombosis of unspecified vein: Secondary | ICD-10-CM | POA: Diagnosis not present

## 2022-12-07 DIAGNOSIS — E119 Type 2 diabetes mellitus without complications: Secondary | ICD-10-CM | POA: Diagnosis not present

## 2022-12-07 DIAGNOSIS — M818 Other osteoporosis without current pathological fracture: Secondary | ICD-10-CM | POA: Diagnosis not present

## 2022-12-07 DIAGNOSIS — M059 Rheumatoid arthritis with rheumatoid factor, unspecified: Secondary | ICD-10-CM | POA: Diagnosis not present

## 2022-12-07 DIAGNOSIS — J45901 Unspecified asthma with (acute) exacerbation: Secondary | ICD-10-CM | POA: Diagnosis not present

## 2022-12-07 DIAGNOSIS — I255 Ischemic cardiomyopathy: Secondary | ICD-10-CM | POA: Diagnosis not present

## 2022-12-07 DIAGNOSIS — K219 Gastro-esophageal reflux disease without esophagitis: Secondary | ICD-10-CM | POA: Diagnosis not present

## 2022-12-10 DIAGNOSIS — M818 Other osteoporosis without current pathological fracture: Secondary | ICD-10-CM | POA: Diagnosis not present

## 2022-12-10 DIAGNOSIS — E785 Hyperlipidemia, unspecified: Secondary | ICD-10-CM | POA: Diagnosis not present

## 2022-12-10 DIAGNOSIS — I255 Ischemic cardiomyopathy: Secondary | ICD-10-CM | POA: Diagnosis not present

## 2022-12-10 DIAGNOSIS — J45901 Unspecified asthma with (acute) exacerbation: Secondary | ICD-10-CM | POA: Diagnosis not present

## 2022-12-10 DIAGNOSIS — M059 Rheumatoid arthritis with rheumatoid factor, unspecified: Secondary | ICD-10-CM | POA: Diagnosis not present

## 2022-12-10 DIAGNOSIS — K219 Gastro-esophageal reflux disease without esophagitis: Secondary | ICD-10-CM | POA: Diagnosis not present

## 2022-12-10 DIAGNOSIS — E119 Type 2 diabetes mellitus without complications: Secondary | ICD-10-CM | POA: Diagnosis not present

## 2022-12-10 DIAGNOSIS — I8291 Chronic embolism and thrombosis of unspecified vein: Secondary | ICD-10-CM | POA: Diagnosis not present

## 2022-12-10 IMAGING — CT CT CHEST W/O CM
2 of 4 series · 15 of 36 positions shown, 18 images · non-contrast
Comparison: 03/25/2020

CLINICAL DATA: Pulmonary nodules.

EXAM:
CT CHEST WITHOUT CONTRAST
TECHNIQUE: Multidetector CT imaging of the chest was performed following the
standard protocol without IV contrast.

[Series 2: thorax · axial · 0.69mm/px · z∈[-296,-36]mm · 12 of 154 slices shown, 15 images]
[im 12/154  mediastinal]
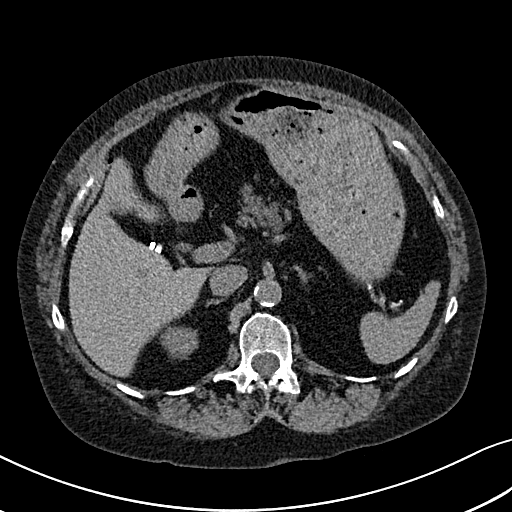
[im 12/154  lung]
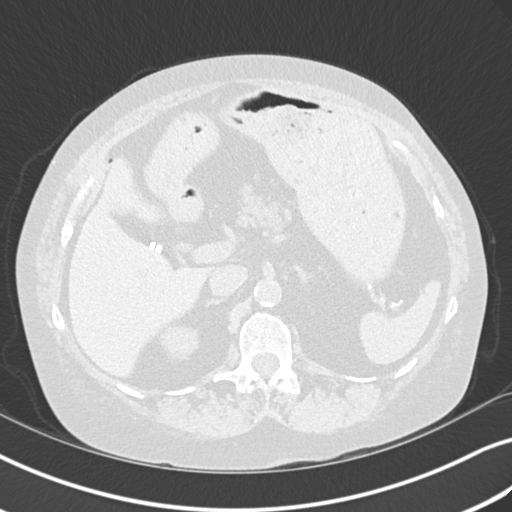
[im 24/154  lung]
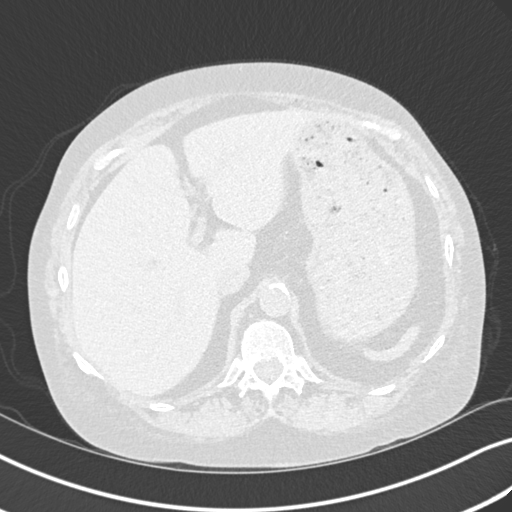
[im 36/154  lung]
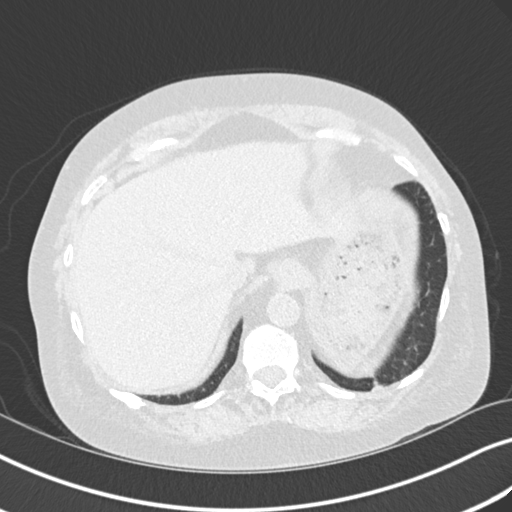
[im 48/154  lung]
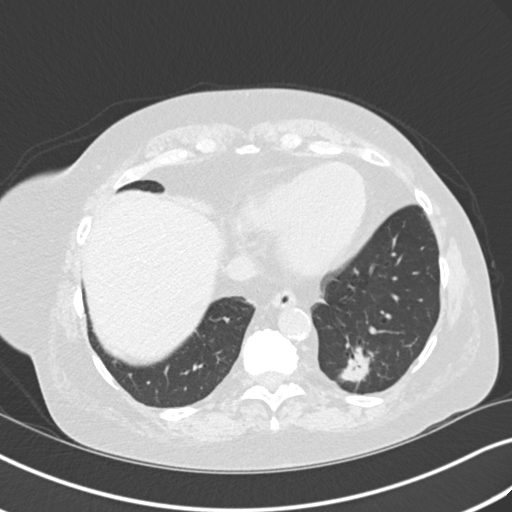
[im 59/154  mediastinal]
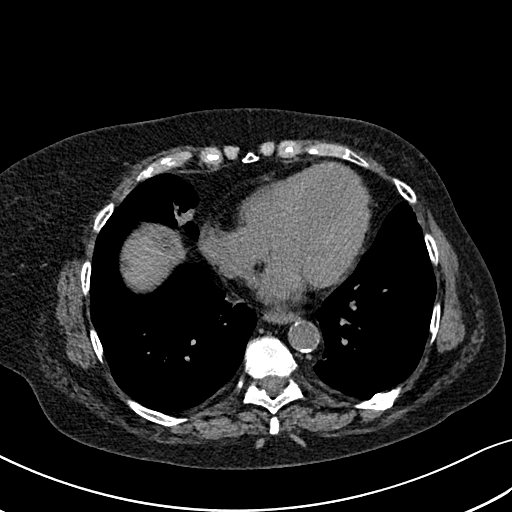
[im 59/154  lung]
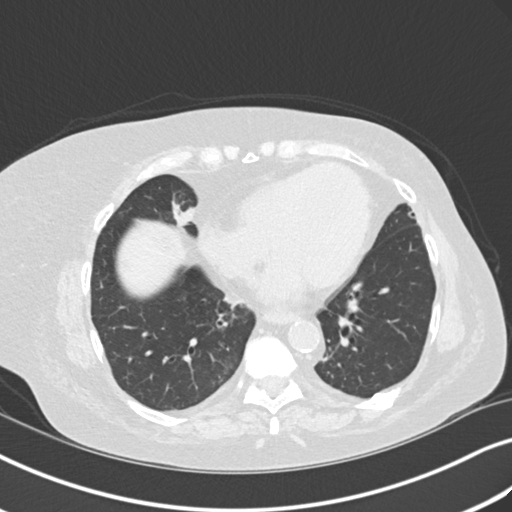
[im 71/154  lung]
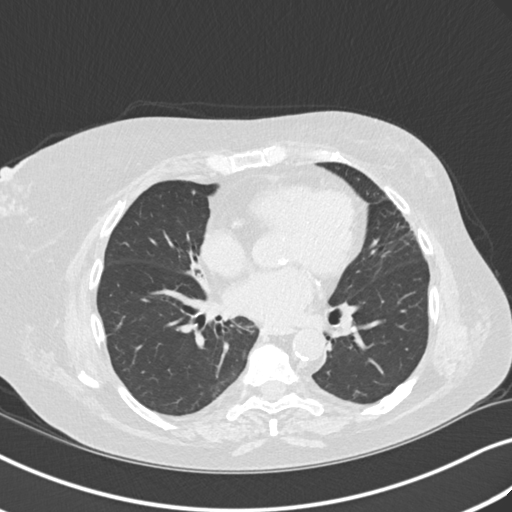
[im 83/154  lung]
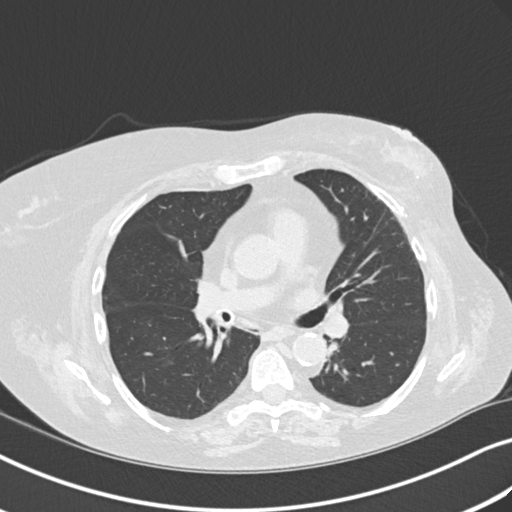
[im 95/154  lung]
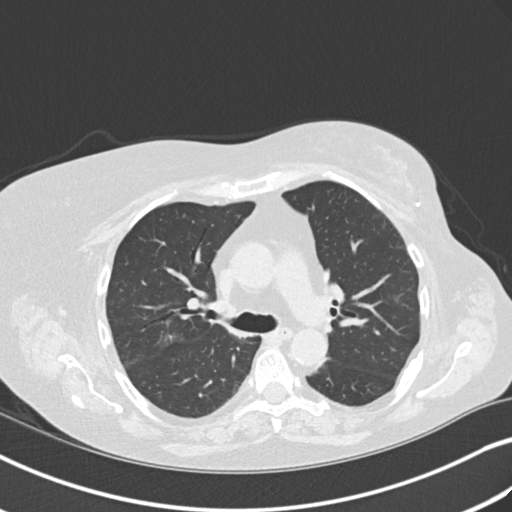
[im 106/154  mediastinal]
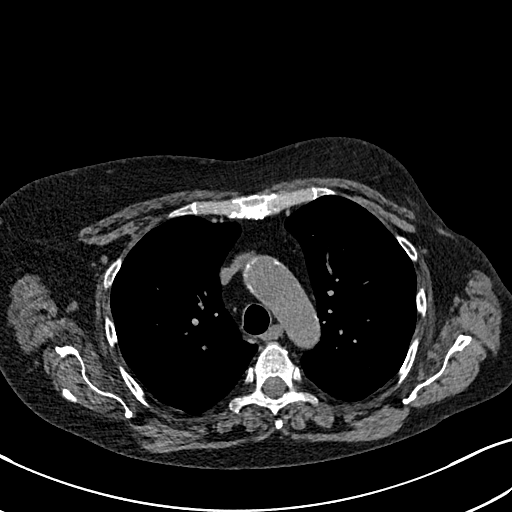
[im 106/154  lung]
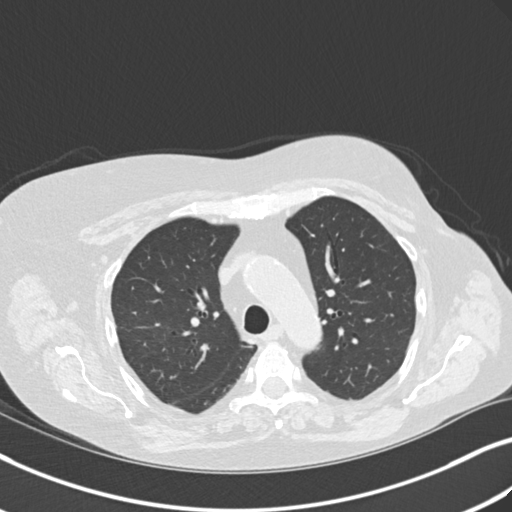
[im 118/154  lung]
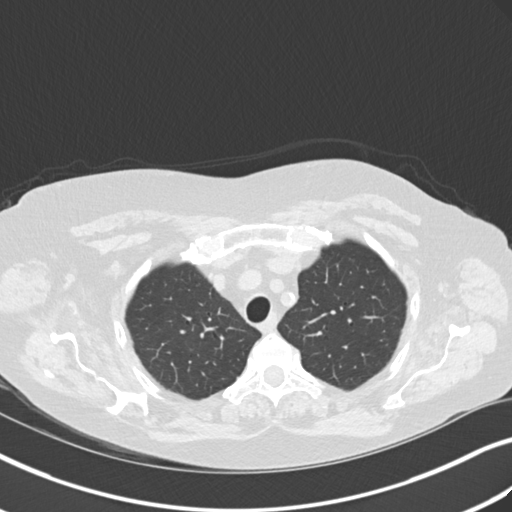
[im 130/154  lung]
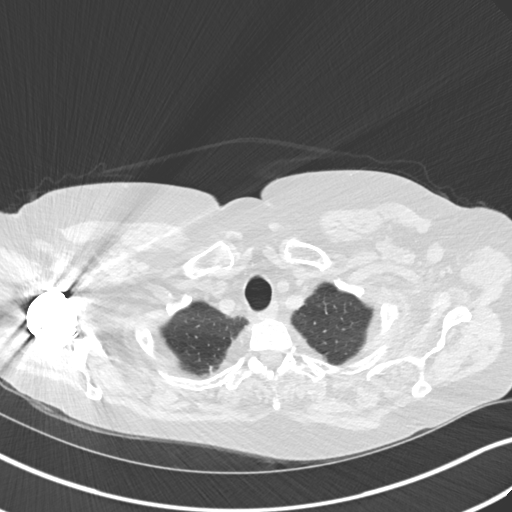
[im 142/154  lung]
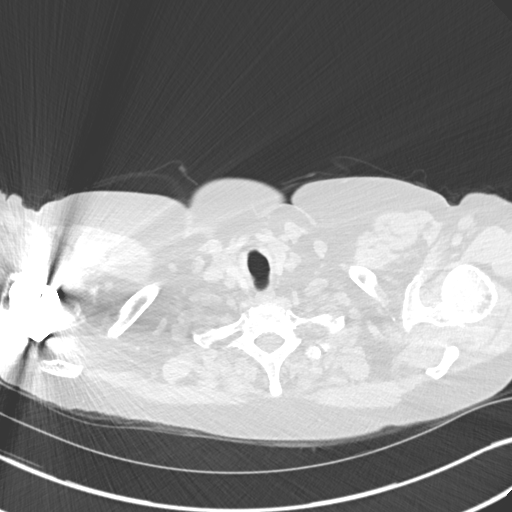

[Series 5: coronal · coronal · 0.60mm/px · 3 of 110 slices shown]
[im 22/110  lung]
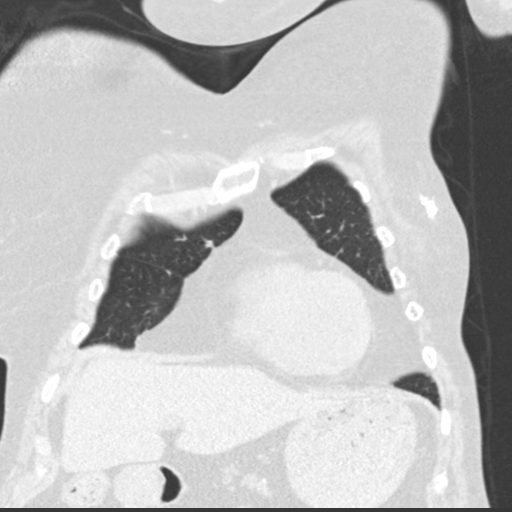
[im 44/110  lung]
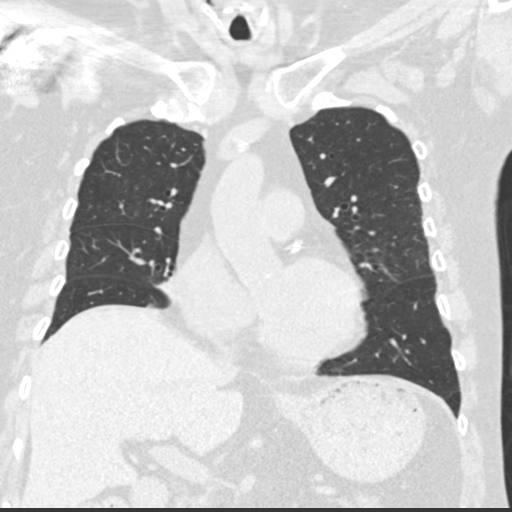
[im 66/110  lung]
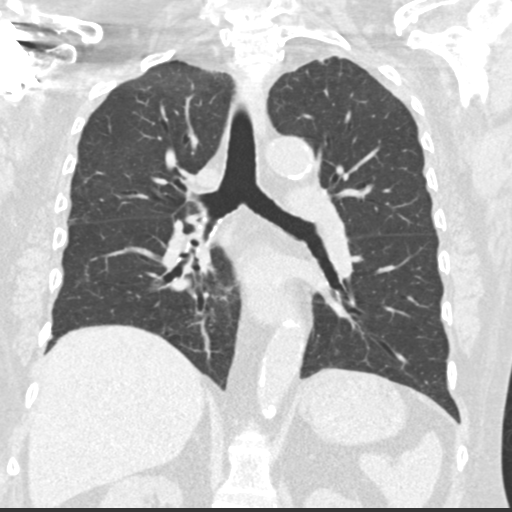

[15 of 36 positions shown; findings below may reference images not displayed]

FINDINGS: Cardiovascular: The heart size is normal. No substantial pericardial
effusion. Coronary artery calcification is evident. Atherosclerotic
calcification is noted in the wall of the thoracic aorta.

Mediastinum/Nodes: No mediastinal lymphadenopathy. No evidence for
gross hilar lymphadenopathy although assessment is limited by the
lack of intravenous contrast on today's study. The esophagus has
normal imaging features. There is no axillary lymphadenopathy.

Lungs/Pleura: Centrilobular emphsyema noted.

Marked interval reduction in the 1.6 x 1.4 cm posterior right upper
lobe pulmonary nodule identified previously. This nodule now
measures about 7 mm on image 58/3.

7 mm anterior right upper lobe nodule measured previously has nearly
resolved with only some wispy soft tissue attenuation in this region
today (image 66/3).

13 mm nodular opacity measured in the medial right upper lobe
previously is 6 mm on image 75/3 today.

2.0 x 1.1 cm nodular/consolidative opacity measured in the medial
right lung base previously is now 1.8 x 1.0 cm (94/3). Dominant
posterior left lower lobe nodule measured previously at 3.7 x 3.7 cm
is now 1.9 x 1.6 cm on image 108/3.

No new suspicious pulmonary nodule or mass.  No pleural effusion.

Upper Abdomen: Unremarkable.

Musculoskeletal: Posterior rib fractures noted bilaterally. There
are some areas of subtle sclerosis in the posterior ribs
bilaterally, without apparent evidence of trauma but relatively
symmetric and of indeterminate etiology. Status post right shoulder
replacement
IMPRESSION: 1. Marked interval reduction in size of multiple bilateral pulmonary
nodules. No new suspicious pulmonary nodule or mass on today's
study.
2. Multiple old posterior rib fractures noted bilaterally with
additional areas of sclerosis in the posterior ribs of indeterminate
etiology.
3. Aortic Atherosclerosis (BS5JV-U3G.G) and Emphysema ([id number]-ZTU.Z).

## 2022-12-11 ENCOUNTER — Encounter: Payer: Self-pay | Admitting: Internal Medicine

## 2022-12-11 ENCOUNTER — Ambulatory Visit: Payer: Medicare PPO | Admitting: Primary Care

## 2022-12-11 ENCOUNTER — Ambulatory Visit: Payer: Medicare PPO | Attending: Internal Medicine | Admitting: Internal Medicine

## 2022-12-11 VITALS — BP 97/55 | HR 79 | Resp 14 | Ht 60.0 in | Wt 130.0 lb

## 2022-12-11 DIAGNOSIS — K219 Gastro-esophageal reflux disease without esophagitis: Secondary | ICD-10-CM | POA: Diagnosis not present

## 2022-12-11 DIAGNOSIS — R21 Rash and other nonspecific skin eruption: Secondary | ICD-10-CM | POA: Diagnosis not present

## 2022-12-11 DIAGNOSIS — I255 Ischemic cardiomyopathy: Secondary | ICD-10-CM | POA: Diagnosis not present

## 2022-12-11 DIAGNOSIS — M0600 Rheumatoid arthritis without rheumatoid factor, unspecified site: Secondary | ICD-10-CM | POA: Diagnosis not present

## 2022-12-11 DIAGNOSIS — E119 Type 2 diabetes mellitus without complications: Secondary | ICD-10-CM | POA: Diagnosis not present

## 2022-12-11 DIAGNOSIS — M059 Rheumatoid arthritis with rheumatoid factor, unspecified: Secondary | ICD-10-CM | POA: Diagnosis not present

## 2022-12-11 DIAGNOSIS — M797 Fibromyalgia: Secondary | ICD-10-CM

## 2022-12-11 DIAGNOSIS — Z7952 Long term (current) use of systemic steroids: Secondary | ICD-10-CM

## 2022-12-11 DIAGNOSIS — R197 Diarrhea, unspecified: Secondary | ICD-10-CM

## 2022-12-11 DIAGNOSIS — I8291 Chronic embolism and thrombosis of unspecified vein: Secondary | ICD-10-CM | POA: Diagnosis not present

## 2022-12-11 DIAGNOSIS — J45901 Unspecified asthma with (acute) exacerbation: Secondary | ICD-10-CM | POA: Diagnosis not present

## 2022-12-11 DIAGNOSIS — E785 Hyperlipidemia, unspecified: Secondary | ICD-10-CM | POA: Diagnosis not present

## 2022-12-11 DIAGNOSIS — M818 Other osteoporosis without current pathological fracture: Secondary | ICD-10-CM | POA: Diagnosis not present

## 2022-12-11 MED ORDER — DULOXETINE HCL 20 MG PO CPEP
20.0000 mg | ORAL_CAPSULE | Freq: Every day | ORAL | Status: DC
Start: 2022-12-11 — End: 2023-03-29

## 2022-12-13 DIAGNOSIS — E119 Type 2 diabetes mellitus without complications: Secondary | ICD-10-CM | POA: Diagnosis not present

## 2022-12-13 DIAGNOSIS — M059 Rheumatoid arthritis with rheumatoid factor, unspecified: Secondary | ICD-10-CM | POA: Diagnosis not present

## 2022-12-13 DIAGNOSIS — I255 Ischemic cardiomyopathy: Secondary | ICD-10-CM | POA: Diagnosis not present

## 2022-12-13 DIAGNOSIS — K219 Gastro-esophageal reflux disease without esophagitis: Secondary | ICD-10-CM | POA: Diagnosis not present

## 2022-12-13 DIAGNOSIS — M818 Other osteoporosis without current pathological fracture: Secondary | ICD-10-CM | POA: Diagnosis not present

## 2022-12-13 DIAGNOSIS — J45901 Unspecified asthma with (acute) exacerbation: Secondary | ICD-10-CM | POA: Diagnosis not present

## 2022-12-13 DIAGNOSIS — I8291 Chronic embolism and thrombosis of unspecified vein: Secondary | ICD-10-CM | POA: Diagnosis not present

## 2022-12-13 DIAGNOSIS — E785 Hyperlipidemia, unspecified: Secondary | ICD-10-CM | POA: Diagnosis not present

## 2022-12-13 NOTE — Progress Notes (Signed)
Remote ICD transmission.   

## 2022-12-14 ENCOUNTER — Ambulatory Visit: Payer: Medicare PPO | Admitting: Family Medicine

## 2022-12-17 DIAGNOSIS — E119 Type 2 diabetes mellitus without complications: Secondary | ICD-10-CM | POA: Diagnosis not present

## 2022-12-17 DIAGNOSIS — K219 Gastro-esophageal reflux disease without esophagitis: Secondary | ICD-10-CM | POA: Diagnosis not present

## 2022-12-17 DIAGNOSIS — I8291 Chronic embolism and thrombosis of unspecified vein: Secondary | ICD-10-CM | POA: Diagnosis not present

## 2022-12-17 DIAGNOSIS — I255 Ischemic cardiomyopathy: Secondary | ICD-10-CM | POA: Diagnosis not present

## 2022-12-17 DIAGNOSIS — M818 Other osteoporosis without current pathological fracture: Secondary | ICD-10-CM | POA: Diagnosis not present

## 2022-12-17 DIAGNOSIS — E785 Hyperlipidemia, unspecified: Secondary | ICD-10-CM | POA: Diagnosis not present

## 2022-12-17 DIAGNOSIS — M059 Rheumatoid arthritis with rheumatoid factor, unspecified: Secondary | ICD-10-CM | POA: Diagnosis not present

## 2022-12-17 DIAGNOSIS — J45901 Unspecified asthma with (acute) exacerbation: Secondary | ICD-10-CM | POA: Diagnosis not present

## 2022-12-19 DIAGNOSIS — I255 Ischemic cardiomyopathy: Secondary | ICD-10-CM | POA: Diagnosis not present

## 2022-12-19 DIAGNOSIS — E785 Hyperlipidemia, unspecified: Secondary | ICD-10-CM | POA: Diagnosis not present

## 2022-12-19 DIAGNOSIS — E119 Type 2 diabetes mellitus without complications: Secondary | ICD-10-CM | POA: Diagnosis not present

## 2022-12-19 DIAGNOSIS — I8291 Chronic embolism and thrombosis of unspecified vein: Secondary | ICD-10-CM | POA: Diagnosis not present

## 2022-12-19 DIAGNOSIS — M818 Other osteoporosis without current pathological fracture: Secondary | ICD-10-CM | POA: Diagnosis not present

## 2022-12-19 DIAGNOSIS — J45901 Unspecified asthma with (acute) exacerbation: Secondary | ICD-10-CM | POA: Diagnosis not present

## 2022-12-19 DIAGNOSIS — K219 Gastro-esophageal reflux disease without esophagitis: Secondary | ICD-10-CM | POA: Diagnosis not present

## 2022-12-19 DIAGNOSIS — M059 Rheumatoid arthritis with rheumatoid factor, unspecified: Secondary | ICD-10-CM | POA: Diagnosis not present

## 2022-12-20 ENCOUNTER — Telehealth: Payer: Self-pay

## 2022-12-20 DIAGNOSIS — I255 Ischemic cardiomyopathy: Secondary | ICD-10-CM | POA: Diagnosis not present

## 2022-12-20 DIAGNOSIS — K219 Gastro-esophageal reflux disease without esophagitis: Secondary | ICD-10-CM | POA: Diagnosis not present

## 2022-12-20 DIAGNOSIS — M059 Rheumatoid arthritis with rheumatoid factor, unspecified: Secondary | ICD-10-CM | POA: Diagnosis not present

## 2022-12-20 DIAGNOSIS — J45901 Unspecified asthma with (acute) exacerbation: Secondary | ICD-10-CM | POA: Diagnosis not present

## 2022-12-20 DIAGNOSIS — M818 Other osteoporosis without current pathological fracture: Secondary | ICD-10-CM | POA: Diagnosis not present

## 2022-12-20 DIAGNOSIS — I8291 Chronic embolism and thrombosis of unspecified vein: Secondary | ICD-10-CM | POA: Diagnosis not present

## 2022-12-20 DIAGNOSIS — E119 Type 2 diabetes mellitus without complications: Secondary | ICD-10-CM | POA: Diagnosis not present

## 2022-12-20 DIAGNOSIS — E785 Hyperlipidemia, unspecified: Secondary | ICD-10-CM | POA: Diagnosis not present

## 2022-12-20 MED ORDER — TRIAMCINOLONE ACETONIDE 40 MG/ML IJ SUSP
40.0000 mg | INTRAMUSCULAR | Status: AC | PRN
Start: 2022-12-11 — End: 2022-12-11
  Administered 2022-12-11: 40 mg via INTRA_ARTICULAR

## 2022-12-20 MED ORDER — LIDOCAINE HCL 1 % IJ SOLN
2.0000 mL | INTRAMUSCULAR | Status: AC | PRN
Start: 2022-12-11 — End: 2022-12-11
  Administered 2022-12-11: 2 mL

## 2022-12-20 NOTE — Telephone Encounter (Signed)
Received faxed [facility] nursing Orders requiring provider signature - DOB verified - form has been faxed to [facility] office for provider to review/complete/sign.  Forwarding message to provider as update.

## 2022-12-21 DIAGNOSIS — E119 Type 2 diabetes mellitus without complications: Secondary | ICD-10-CM | POA: Diagnosis not present

## 2022-12-21 DIAGNOSIS — I255 Ischemic cardiomyopathy: Secondary | ICD-10-CM | POA: Diagnosis not present

## 2022-12-21 DIAGNOSIS — M818 Other osteoporosis without current pathological fracture: Secondary | ICD-10-CM | POA: Diagnosis not present

## 2022-12-21 DIAGNOSIS — I8291 Chronic embolism and thrombosis of unspecified vein: Secondary | ICD-10-CM | POA: Diagnosis not present

## 2022-12-21 DIAGNOSIS — M059 Rheumatoid arthritis with rheumatoid factor, unspecified: Secondary | ICD-10-CM | POA: Diagnosis not present

## 2022-12-21 DIAGNOSIS — K219 Gastro-esophageal reflux disease without esophagitis: Secondary | ICD-10-CM | POA: Diagnosis not present

## 2022-12-21 DIAGNOSIS — J45901 Unspecified asthma with (acute) exacerbation: Secondary | ICD-10-CM | POA: Diagnosis not present

## 2022-12-21 DIAGNOSIS — E785 Hyperlipidemia, unspecified: Secondary | ICD-10-CM | POA: Diagnosis not present

## 2022-12-26 DIAGNOSIS — J4551 Severe persistent asthma with (acute) exacerbation: Secondary | ICD-10-CM | POA: Diagnosis not present

## 2022-12-29 ENCOUNTER — Other Ambulatory Visit: Payer: Self-pay | Admitting: Family Medicine

## 2022-12-29 DIAGNOSIS — M19011 Primary osteoarthritis, right shoulder: Secondary | ICD-10-CM

## 2022-12-29 DIAGNOSIS — M1120 Other chondrocalcinosis, unspecified site: Secondary | ICD-10-CM

## 2022-12-31 DIAGNOSIS — A419 Sepsis, unspecified organism: Secondary | ICD-10-CM | POA: Diagnosis not present

## 2022-12-31 DIAGNOSIS — I5022 Chronic systolic (congestive) heart failure: Secondary | ICD-10-CM | POA: Diagnosis not present

## 2022-12-31 DIAGNOSIS — I130 Hypertensive heart and chronic kidney disease with heart failure and stage 1 through stage 4 chronic kidney disease, or unspecified chronic kidney disease: Secondary | ICD-10-CM | POA: Diagnosis not present

## 2022-12-31 DIAGNOSIS — E785 Hyperlipidemia, unspecified: Secondary | ICD-10-CM | POA: Diagnosis not present

## 2022-12-31 DIAGNOSIS — N1832 Chronic kidney disease, stage 3b: Secondary | ICD-10-CM | POA: Diagnosis not present

## 2022-12-31 DIAGNOSIS — D631 Anemia in chronic kidney disease: Secondary | ICD-10-CM | POA: Diagnosis not present

## 2022-12-31 DIAGNOSIS — E1122 Type 2 diabetes mellitus with diabetic chronic kidney disease: Secondary | ICD-10-CM | POA: Diagnosis not present

## 2022-12-31 DIAGNOSIS — M059 Rheumatoid arthritis with rheumatoid factor, unspecified: Secondary | ICD-10-CM | POA: Diagnosis not present

## 2022-12-31 DIAGNOSIS — J45901 Unspecified asthma with (acute) exacerbation: Secondary | ICD-10-CM | POA: Diagnosis not present

## 2022-12-31 NOTE — Telephone Encounter (Signed)
Requested medication (s) are due for refill today: yes  Requested medication (s) are on the active medication list: yes  Last refill:    Future visit scheduled: no  Notes to clinic:  Unable to refill per protocol, cannot delegate.      Requested Prescriptions  Pending Prescriptions Disp Refills   traMADol (ULTRAM) 50 MG tablet [Pharmacy Med Name: tramadol 50 mg tablet] 30 tablet 0    Sig: TAKE 1 TABLET BY MOUTH AT BEDTIME     Not Delegated - Analgesics:  Opioid Agonists Failed - 12/29/2022 10:17 AM      Failed - This refill cannot be delegated      Failed - Urine Drug Screen completed in last 360 days      Failed - Valid encounter within last 3 months    Recent Outpatient Visits           1 year ago Chronic fatigue   [facility] Tanya Nones, [name], MD   1 year ago Anemia, unspecified type   [facility] [name], [name], MD   1 year ago Anemia, unspecified type   [facility] [name], MD   2 years ago Type 2 diabetes mellitus with other specified complication, unspecified whether long term insulin use (HCC)   [facility] [name], MD   2 years ago Chronic combined systolic and diastolic CHF (congestive heart failure) (HCC)   [name] [name], [name], MD       Future Appointments             Tomorrow Kozlow, [name], MD [facility] [place]   In 2 months Rice, [name], MD [facility] - A Dept Of [place]

## 2023-01-01 ENCOUNTER — Telehealth: Payer: Self-pay | Admitting: Allergy and Immunology

## 2023-01-01 ENCOUNTER — Ambulatory Visit (INDEPENDENT_AMBULATORY_CARE_PROVIDER_SITE_OTHER): Payer: Medicare PPO | Admitting: Allergy and Immunology

## 2023-01-01 ENCOUNTER — Encounter: Payer: Self-pay | Admitting: Allergy and Immunology

## 2023-01-01 ENCOUNTER — Other Ambulatory Visit: Payer: Self-pay

## 2023-01-01 VITALS — BP 100/48 | HR 72 | Temp 98.0°F | Resp 16 | Ht 60.0 in | Wt 133.6 lb

## 2023-01-01 DIAGNOSIS — D801 Nonfamilial hypogammaglobulinemia: Secondary | ICD-10-CM

## 2023-01-01 DIAGNOSIS — D84821 Immunodeficiency due to drugs: Secondary | ICD-10-CM | POA: Diagnosis not present

## 2023-01-01 DIAGNOSIS — Z7952 Long term (current) use of systemic steroids: Secondary | ICD-10-CM

## 2023-01-01 DIAGNOSIS — J479 Bronchiectasis, uncomplicated: Secondary | ICD-10-CM | POA: Diagnosis not present

## 2023-01-01 DIAGNOSIS — K219 Gastro-esophageal reflux disease without esophagitis: Secondary | ICD-10-CM

## 2023-01-01 DIAGNOSIS — J4550 Severe persistent asthma, uncomplicated: Secondary | ICD-10-CM

## 2023-01-01 DIAGNOSIS — G478 Other sleep disorders: Secondary | ICD-10-CM

## 2023-01-01 DIAGNOSIS — T380X5A Adverse effect of glucocorticoids and synthetic analogues, initial encounter: Secondary | ICD-10-CM

## 2023-01-01 DIAGNOSIS — G500 Trigeminal neuralgia: Secondary | ICD-10-CM

## 2023-01-01 DIAGNOSIS — Z888 Allergy status to other drugs, medicaments and biological substances status: Secondary | ICD-10-CM | POA: Diagnosis not present

## 2023-01-01 MED ORDER — CYPROHEPTADINE HCL 4 MG PO TABS: 12.0000 mg | ORAL_TABLET | Freq: Every evening | ORAL | 1 refills | Status: DC

## 2023-01-01 MED ORDER — ALBUTEROL SULFATE (2.5 MG/3ML) 0.083% IN NEBU: 2.5000 mg | INHALATION_SOLUTION | RESPIRATORY_TRACT | 1 refills | Status: AC | PRN

## 2023-01-01 MED ORDER — FAMOTIDINE 20 MG PO TABS: 20.0000 mg | ORAL_TABLET | Freq: Every morning | ORAL | 1 refills | Status: DC

## 2023-01-01 MED ORDER — NEBULIZER MASK ADULT MISC: 1.0000 | 1 refills | Status: AC

## 2023-01-01 MED ORDER — MUPIROCIN 2 % EX OINT: 1.0000 | TOPICAL_OINTMENT | CUTANEOUS | 1 refills | Status: DC | PRN

## 2023-01-01 MED ORDER — TRELEGY ELLIPTA 200-62.5-25 MCG/ACT IN AEPB: 1.0000 | INHALATION_SPRAY | Freq: Every morning | RESPIRATORY_TRACT | 1 refills | Status: DC

## 2023-01-01 MED ORDER — DEXLANSOPRAZOLE 60 MG PO CPDR: 60.0000 mg | DELAYED_RELEASE_CAPSULE | Freq: Every morning | ORAL | 1 refills | Status: DC

## 2023-01-01 MED ORDER — ALBUTEROL SULFATE HFA 108 (90 BASE) MCG/ACT IN AERS: 2.0000 | INHALATION_SPRAY | Freq: Four times a day (QID) | RESPIRATORY_TRACT | 1 refills | Status: AC | PRN

## 2023-01-01 NOTE — Patient Instructions (Addendum)
  1. 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 Periactin 4 mg tablet  3 tablets at bedtime  2. Continue to Treat laryngopharyngeal reflux:    A. Dexilant 60 mg in the morning  B. famotidine 40 mg daily  3.  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  Continue immunoglobulin infusions   B.  Check IgA/G/M today  4. Treat inflammation of airway:   A. Trelegy 200 - I inhalation 1 time per day (replaces Stiolto - empty lungs)  5.  If needed:  A. Astelin and Zyrtec and nasal saline  B. Percussion vest   C. Albuterol + Fluticasone 44 - 2 inhalations TOGETHER every 4-6 hours D. Albuterol nebulization every 4-6 hours  6. Return to clinic in 4 weeks or earlier if problem

## 2023-01-01 NOTE — Progress Notes (Unsigned)
[place] - [place] - [place] - [place] - [place]   Follow-up Note  Referring Provider: [name], MD Primary Provider: [name], MD Date of Office Visit: 01/01/2023  Subjective:   Ebony Scott (DOB: [birth year]) is a 72 y.o. female who [facility]/2023 in re-evaluation of the following:  HPI: Ebony Scott returns to this clinic in evaluation of hypogammaglobulinemia, chronic rhinitis, history of asthma/bronchiectasis/tracheobronchial malacia, shellfish allergy, facial pain syndrome and sleep dysfunction, reflux.  I last saw her in this clinic 07 August 2022.  She informs me that she is required a hospitalization since her last visit for "pneumonia.  This occurs even though she has been using her immunoglobulin infusions.  She is having coughing and she is having wheezing which has been a chronic issue for her at this point.  She is followed by pulmonary for her respiratory tract issues.  She does not use her percussion vest very often.  She feels as though her reflux is under very good control.  Her sleep dysfunction is still very dysfunctional.  She is using 12 mg of Periactin before sleep but does not really appear to be working very well.  Fortunately, her Periactin has resulted in complete control of her facial pain and headache syndrome.  She does not consume shellfish.  She continues on prednisone at 7 mg daily.  Allergies as of 01/01/2023       Reactions   Baclofen Anaphylaxis, Other (See Comments)   Altered mental status requiring a 3-day hospital stay- patient became unresponsive "Put me in a coma for five days", Altered mental status requiring a 3-day hospital stay- patient became unresponsive   Dust Mite Extract Shortness Of Breath, Other (See Comments)   "sneezing" (02/20/2012) too   Molds & Smuts Shortness Of Breath   Morphine And Codeine Hives, Itching   Other Shortness Of Breath, Other (See Comments)   Grass  and weeds "sneezing; filled sinuses" (02/20/2012)   Penicillins Rash, Other (See Comments)   "welts" (02/20/2012) Tolerated Ancef 02/16/20   Rofecoxib Swelling, Other (See Comments)   Vioxx- feet swelling   Shellfish Allergy Anaphylaxis, Shortness Of Breath, Itching, Rash   Shrimp Extract Anaphylaxis, Other (See Comments)   ALL SHELLFISH   Tetracycline Hcl Nausea And Vomiting   Tetracycline Hcl Other (See Comments)   GI Intolerance   Xolair [omalizumab] Other (See Comments)   Caused Blood clot   Zoledronic Acid Other (See Comments)   Reclast- Fever, Put in hospital, dr said it was a reaction from a reaction  Fever, inability to walk, Put in hospital Fever, Put in hospital, dr said it was a reaction from a reaction , Other reaction(s): Unknown   Dilaudid [hydromorphone Hcl] Itching   Hydrocodone-acetaminophen Itching   Levofloxacin Other (See Comments)   GI upset   Oxycodone Hcl Nausea And Vomiting   GI Intolerance   Paroxetine Nausea And Vomiting, Other (See Comments)   GI Intolerance also   Celecoxib Swelling, Other (See Comments)   Feet swelling   Diltiazem Hcl Swelling   Lactose Other (See Comments)   Bloating and gas   Oxycodone-acetaminophen Itching   Pork-derived Products Other (See Comments)   Rituximab Other (See Comments)   Anemia    Tree Extract Other (See Comments)   "tested and told I was allergic to it; never experienced a reaction to it" (02/20/2012)   Gamunex [immune Globulin] Itching, Rash   Tolerates hizentra    Penicillin G  Procaine Rash, Other (See Comments)   Welts, also        Medication List    acetaminophen 500 MG tablet Commonly known as: TYLENOL Take 1,000 mg by mouth as needed for moderate pain.   albuterol 108 (90 Base) MCG/ACT inhaler Commonly known as: VENTOLIN HFA Inhale 2 puffs into the lungs every 6 (six) hours as needed for wheezing or shortness of breath.   Align 4 MG Caps Take 4 mg by mouth every evening.   allopurinol 100 MG  tablet Commonly known as: ZYLOPRIM TAKE 1 TABLET BY MOUTH DAILY What changed: when to take this   Alpha-Lipoic Acid 600 MG Tabs Take 600 mg by mouth in the morning.   atorvastatin 80 MG tablet Commonly known as: LIPITOR TAKE 1 TABLET BY MOUTH DAILY What changed: when to take this   azelastine 0.1 % nasal spray Commonly known as: ASTELIN Place 2 sprays into both nostrils daily. Use in each nostril as directed What changed:  when to take this reasons to take this   BEANO PO Take 2-3 tablets by mouth 3 (three) times daily as needed (Gas).   benzonatate 200 MG capsule Commonly known as: TESSALON TAKE 1 CAPSULE BY MOUTH THREE TIMES DAILY AS NEEDED FOR COUGH What changed: See the new instructions.   budesonide 0.5 MG/2ML nebulizer solution Commonly known as: Pulmicort Take 2 mLs (0.5 mg total) by nebulization 2 (two) times daily.   cetirizine 10 MG tablet Commonly known as: ZYRTEC Take 1 tablet (10 mg total) by mouth 2 (two) times daily as needed for allergies (Can take a n extra dose during flare ups.). What changed: when to take this   clopidogrel 75 MG tablet Commonly known as: PLAVIX Take 1 tablet (75 mg total) by mouth daily. What changed: when to take this   denosumab 60 MG/ML Sosy injection Commonly known as: PROLIA Inject 60 mg into the skin every 6 (six) months.   Dexilant 60 MG capsule Generic drug: dexlansoprazole Take 60 mg by mouth in the morning.   DULoxetine 20 MG capsule Commonly known as: CYMBALTA Take 1 capsule (20 mg total) by mouth daily.   empagliflozin 10 MG Tabs tablet Commonly known as: Jardiance Take 1 tablet (10 mg total) by mouth daily before breakfast.   EPINEPHrine 0.3 mg/0.3 mL Soaj injection Commonly known as: EPI-PEN USE AS DIRECTED BY YOUR PHYSICIAN INTRAMUSCULARLY AS NEEDED FOR ANAPHYLAXIS   famotidine 20 MG tablet Commonly known as: PEPCID Take 1 tablet (20 mg total) by mouth daily. TAKE 1 TABLET BY MOUTH EACH NIGHT AT  BEDTIME Strength: 20 mg What changed:  when to take this additional instructions   folic acid 1 MG tablet Commonly known as: FOLVITE Take 1 tablet (1 mg total) by mouth daily. What changed: when to take this   gabapentin 300 MG capsule Commonly known as: NEURONTIN Take 1 capsule (300 mg total) by mouth at bedtime.   guaiFENesin 600 MG 12 hr tablet Commonly known as: MUCINEX Take 600 mg by mouth 2 (two) times daily.   Hizentra 10 GM/50ML Soln Generic drug: Immune Globulin (Human) Inject 65 mLs into the skin once a week. 13 g Infusion   ipratropium-albuterol 0.5-2.5 (3) MG/3ML Soln Commonly known as: DUONEB Take 3 mLs by nebulization every 6 (six) hours as needed.   isosorbide-hydrALAZINE 20-37.5 MG tablet Commonly known as: BIDIL Take 1 tablet by mouth 2 (two) times daily.   LACTOSE FAST ACTING RELIEF PO Take 2-3 tablets by mouth 3 (three) times  daily before meals. For dairy food or drink   magnesium oxide 400 (240 Mg) MG tablet Commonly known as: MAG-OX Take 400 mg by mouth at bedtime.   montelukast 10 MG tablet Commonly known as: SINGULAIR Take 1 tablet (10 mg total) by mouth daily after supper.   mupirocin ointment 2 % Commonly known as: BACTROBAN Apply 1 Application topically as needed (wounds).   predniSONE 5 MG tablet Commonly known as: DELTASONE tAKE 1 TABLET (5MG ) BY MOUTH ONCE DAILY WITH BREAKFAST   predniSONE 1 MG tablet Commonly known as: DELTASONE TAKE 2 TABLETS (2MG ) BY MOUTH ONCE DAILY WITH BREAKFAST. TAKE ALONG WITH 5MG  DAILY   PRENATAL VITAMINS PO Take 1 tablet by mouth in the morning.   sacubitril-valsartan 24-26 MG Commonly known as: ENTRESTO Take 1 tablet by mouth 2 (two) times daily.   Stiolto Respimat 2.5-2.5 MCG/ACT Aers Generic drug: Tiotropium Bromide-Olodaterol Inhale 2 puffs into the lungs daily. What changed: when to take this   traMADol 50 MG tablet Commonly known as: ULTRAM Take 1 tablet (50 mg total) by mouth at  bedtime.   traZODone 50 MG tablet Commonly known as: DESYREL Take 1 tablet (50 mg total) by mouth at bedtime.   Vitamin D3 25 MCG (1000 UT) Caps Take 1,000 Units by mouth at bedtime.   Voltaren 1 % Gel Generic drug: diclofenac Sodium Apply 1 Application topically as needed (pain).   Xarelto 20 MG Tabs tablet Generic drug: rivaroxaban TAKE 1 TABLET BY MOUTH DAILY WITH SUPPER What changed: how much to take        Past Medical History:  Diagnosis Date   Anemia    Angio-edema    Breast cancer (HCC) 1998   Left breast, in remission   Bronchiectasis (HCC)    CHF (congestive heart failure) (HCC)    Clostridium difficile colitis 01/14/2018   Coronary artery disease    Depression    "some; don't take anything for it" (02/20/2012), pt denies dx as of 06/02/21   Diverticulosis    DVT (deep venous thrombosis) (HCC)    Fibromyalgia 11/2011   GAVE (gastric antral vascular ectasia)    GERD (gastroesophageal reflux disease)    Headache(784.0)    "related to allergies; more at different times during the year" (02/20/2012)   Hemorrhoids    Hiatal hernia    back and neck   History of blood transfusion 2023   1 unit, 5 units of Iron   Hx of adenomatous colonic polyps 04/12/2016   Hypercholesteremia    good cholesterol is high   Hypothyroidism    h/o Graves disease   IBS (irritable bowel syndrome)    Moderate persistent asthma    -FeV1 72% 2011, -IgE 102 2011, CT sinus Neg 2011   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 Casimer Lanius   Sleep apnea    uses cpap nightly   Tracheobronchomalacia     Past Surgical History:  Procedure Laterality Date   ABDOMINAL HYSTERECTOMY N/A    Phreesia 10/21/2019   ANTERIOR AND POSTERIOR REPAIR  1990's   APPENDECTOMY     ARGON LASER APPLICATION  02/05/2021   Procedure:  ARGON LASER APPLICATION;  Surgeon: Benancio Deeds, MD;  Location: [facility];  Service: Gastroenterology;;   BREAST LUMPECTOMY  1998   left  BREAST SURGERY N/A    Phreesia 10/21/2019   BRONCHIAL WASHINGS  04/05/2020   Procedure: BRONCHIAL WASHINGS;  Surgeon: Martina Sinner, MD;  Location: [facility];  Service: Pulmonary;;   CAPSULOTOMY Right 08/28/2021   Procedure: CAPSULOTOMY;  Surgeon: Candelaria Stagers, DPM;  Location: [facility];  Service: Podiatry;  Laterality: Right;   CARPOMETACARPEL (CMC) FUSION OF THUMB WITH AUTOGRAFT FROM RADIUS  ~ 2009   "both thumbs" (02/20/2012)   CATARACT EXTRACTION W/ INTRAOCULAR LENS  IMPLANT, BILATERAL  2012   CERVICAL DISCECTOMY  10/2001   C5-C6   CERVICAL FUSION  2003   C3-C4   CHOLECYSTECTOMY     COLONOSCOPY     CORONARY STENT INTERVENTION N/A 01/11/2021   Procedure: CORONARY STENT INTERVENTION;  Surgeon: Marykay Lex, MD;  Location: [facility] INVASIVE CV LAB;  Service: Cardiovascular;  Laterality: N/A;   DEBRIDEMENT TENNIS ELBOW  ?1970's   right   ESOPHAGOGASTRODUODENOSCOPY     ESOPHAGOGASTRODUODENOSCOPY (EGD) WITH PROPOFOL N/A 02/05/2021   Procedure: ESOPHAGOGASTRODUODENOSCOPY (EGD) WITH PROPOFOL;  Surgeon: Benancio Deeds, MD;  Location: [facility];  Service: Gastroenterology;  Laterality: N/A;   EYE SURGERY N/A    Phreesia 10/21/2019   HAMMER TOE SURGERY Right 08/28/2021   Procedure: HAMMER TOE REPAIR 2ND TOE RIGHT FOOT;  Surgeon: Candelaria Stagers, DPM;  Location: [facility];  Service: Podiatry;  Laterality: Right;   HYSTERECTOMY     I & D EXTREMITY Right 09/22/2020   Procedure: IRRIGATION AND DEBRIDEMENT RIGHT HAND AND ELBOW;  Surgeon: Teryl Lucy, MD;  Location: [facility];  Service: Orthopedics;  Laterality: Right;   ICD IMPLANT N/A 08/27/2022   Procedure: ICD IMPLANT;  Surgeon: Maurice Small, MD;  [facility];  Service: Cardiovascular;  Laterality: N/A;   KNEE ARTHROPLASTY  ?1990's   "?right; w/cartilage repair"  (02/20/2012)   MASS EXCISION Left 08/28/2021   Procedure: EXCISION OF SOFT TISSUE  MASS LEFT FOOT;  Surgeon: Candelaria Stagers, DPM;  Location: [facility];  Service: Podiatry;  Laterality: Left;   NASAL SEPTUM SURGERY  1980's   POSTERIOR CERVICAL FUSION/FORAMINOTOMY  2004   "failed initial fusion; rewired  anterior neck" (02/20/2012)   REVERSE SHOULDER ARTHROPLASTY Right 02/16/2020   Procedure: REVERSE SHOULDER ARTHROPLASTY;  Surgeon: Teryl Lucy, MD;  [facility];  Service: Orthopedics;  Laterality: Right;   RIGHT/LEFT HEART CATH AND CORONARY ANGIOGRAPHY N/A 01/11/2021   Procedure: RIGHT/LEFT HEART CATH AND CORONARY ANGIOGRAPHY;  Surgeon: Marykay Lex, MD;  Location: [facility]AS[facility] CV LAB;  Service: Cardiovascular;  Laterality: N/A;   SPINE SURGERY N/A    Phreesia 10/21/2019   TONSILLECTOMY  ~ 1953   VESICOVAGINAL FISTULA CLOSURE W/ TAH  1988   VIDEO BRONCHOSCOPY Bilateral 08/23/2016   Procedure: VIDEO BRONCHOSCOPY WITH FLUORO;  Surgeon: Roslynn Amble, MD;  [facility];  Service: Cardiopulmonary;  Laterality: Bilateral;   VIDEO BRONCHOSCOPY N/A 04/05/2020   Procedure: VIDEO BRONCHOSCOPY WITHOUT FLUORO;  Surgeon: Martina Sinner, MD;  [facility];  Service: Pulmonary;  Laterality: N/A;    Review of systems negative except as noted in HPI / PMHx or noted below:  Review of Systems  Constitutional: Negative.   HENT: Negative.    Eyes: Negative.   Respiratory: Negative.    Cardiovascular: Negative.   Gastrointestinal: Negative.   Genitourinary: Negative.   Musculoskeletal: Negative.   Skin: Negative.   Neurological: Negative.   Endo/Heme/Allergies: Negative.   Psychiatric/Behavioral: Negative.       Objective:  Vitals:   01/01/23 1143  BP: (!) 100/48  Pulse: 72  Resp: 16  Temp: 98 F (36.7 C)  SpO2: 95%   Height: 5' (152.4 cm)  Weight: 133 lb 9.6 oz (60.6 kg)   Physical Exam Constitutional:      Appearance: She is not diaphoretic.  HENT:      Head: Normocephalic.     Right Ear: Tympanic membrane, ear canal and external ear normal.     Left Ear: Tympanic membrane, ear canal and external ear normal.     Nose: Nose normal. No mucosal edema or rhinorrhea.     Mouth/Throat:     Pharynx: Uvula midline. No oropharyngeal exudate.  Eyes:     Conjunctiva/sclera: Conjunctivae normal.  Neck:     Thyroid: No thyromegaly.     Trachea: Trachea normal. No tracheal tenderness or tracheal deviation.  Cardiovascular:     Rate and Rhythm: Normal rate and regular rhythm.     Heart sounds: Normal heart sounds, S1 normal and S2 normal. No murmur heard. Pulmonary:     Effort: No respiratory distress.     Breath sounds: No stridor. Wheezing (expiratory wheezes all lung fields) present. No rales.  Lymphadenopathy:     Head:     Right side of head: No tonsillar adenopathy.     Left side of head: No tonsillar adenopathy.     Cervical: No cervical adenopathy.  Skin:    Findings: No erythema or rash.     Nails: There is no clubbing.  Neurological:     Mental Status: She is alert.     Diagnostics: Spirometry was performed and demonstrated an FEV1 of 1.45 at 80 % of predicted.   Assessment and Plan:   1. Hypogammaglobulinemia (HCC)   2. Not well controlled severe persistent asthma   3. Bronchiectasis without complication (HCC)   4. LPRD (laryngopharyngeal reflux disease)   5. Facial pain syndrome   6. Sleep dysfunction with sleep stage disturbance   7. Immunosuppression due to chronic steroid use (HCC)    1. 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 Periactin 4 mg tablet  3 tablets at bedtime  2. Continue to Treat laryngopharyngeal reflux:    A. Dexilant 60 mg in the morning  B. famotidine 40 mg daily  3.  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  Continue immunoglobulin infusions   B.  Check IgA/G/M today  4. Treat inflammation of airway:   A. Trelegy 200 - I inhalation 1 time per day (replaces Stiolto -  empty lungs)  5.  If needed:  A. Astelin and Zyrtec and nasal saline  B. Percussion vest   C. Albuterol + Fluticasone 44 - 2 inhalations TOGETHER every 4-6 hours D. Albuterol nebulization every 4-6 hours  6. Return to clinic in 4 weeks or earlier if problem  Rylan has evidence of inflammation affecting her respiratory tract and I am going to have her use some inhaled steroid in the form of Trelegy as noted above and whenever she needs to use albuterol she can also add in some additional inhaled steroid with fluticasone using a anti-inflammatory rescue medicine plan.  We will check her immunoglobulin levels today.  She will continue to address reflux.  Fortunately her facial pain issues under good control Periactin.  However, her sleep dysfunction has not Niavasc her to contact her primary care doctor about this issue.  I am going to see her back in this clinic in 4 weeks.  Laurette Schimke, MD Allergy / Immunology [facility]

## 2023-01-01 NOTE — Telephone Encounter (Signed)
Ebony Scott was checking out and stated she forgot to mention she needs refills on Mupirocin Ointment.  I am not sure if we have prescribed It to her.  Ebony Scott would like this called in to [facility]

## 2023-01-01 NOTE — Telephone Encounter (Signed)
Sent in mupirocin ointment to [facility]

## 2023-01-02 ENCOUNTER — Encounter: Payer: Self-pay | Admitting: Allergy and Immunology

## 2023-01-02 LAB — IGG, IGA, IGM
IgA/Immunoglobulin A, Serum: 99 mg/dL (ref 64–422)
IgG (Immunoglobin G), Serum: 799 mg/dL (ref 586–1602)
IgM (Immunoglobulin M), Srm: 61 mg/dL (ref 26–217)

## 2023-01-05 DIAGNOSIS — M059 Rheumatoid arthritis with rheumatoid factor, unspecified: Secondary | ICD-10-CM | POA: Diagnosis not present

## 2023-01-05 DIAGNOSIS — A419 Sepsis, unspecified organism: Secondary | ICD-10-CM | POA: Diagnosis not present

## 2023-01-05 DIAGNOSIS — I130 Hypertensive heart and chronic kidney disease with heart failure and stage 1 through stage 4 chronic kidney disease, or unspecified chronic kidney disease: Secondary | ICD-10-CM | POA: Diagnosis not present

## 2023-01-05 DIAGNOSIS — D631 Anemia in chronic kidney disease: Secondary | ICD-10-CM | POA: Diagnosis not present

## 2023-01-05 DIAGNOSIS — I5022 Chronic systolic (congestive) heart failure: Secondary | ICD-10-CM | POA: Diagnosis not present

## 2023-01-05 DIAGNOSIS — J45901 Unspecified asthma with (acute) exacerbation: Secondary | ICD-10-CM | POA: Diagnosis not present

## 2023-01-05 DIAGNOSIS — E785 Hyperlipidemia, unspecified: Secondary | ICD-10-CM | POA: Diagnosis not present

## 2023-01-05 DIAGNOSIS — E1122 Type 2 diabetes mellitus with diabetic chronic kidney disease: Secondary | ICD-10-CM | POA: Diagnosis not present

## 2023-01-05 DIAGNOSIS — N1832 Chronic kidney disease, stage 3b: Secondary | ICD-10-CM | POA: Diagnosis not present

## 2023-01-07 ENCOUNTER — Telehealth: Payer: Self-pay

## 2023-01-07 NOTE — Telephone Encounter (Signed)
Verbal orders given to [facility] with [facility]. Mjp,lpn  Copied from CRM [id number]. Topic: Clinical - Home Health Verbal Orders >> Jan 07, 2023 11:34 AM Dennison Nancy wrote: Caller/Agency: [facility]  Callback Number: [phone] Service Requested: therapy Frequency: 1 time week for 3 weeks, 1 time a week for  every other week for 3 visits  Any new concerns about the patient? No

## 2023-01-08 DIAGNOSIS — I130 Hypertensive heart and chronic kidney disease with heart failure and stage 1 through stage 4 chronic kidney disease, or unspecified chronic kidney disease: Secondary | ICD-10-CM | POA: Diagnosis not present

## 2023-01-08 DIAGNOSIS — I5022 Chronic systolic (congestive) heart failure: Secondary | ICD-10-CM | POA: Diagnosis not present

## 2023-01-08 DIAGNOSIS — E785 Hyperlipidemia, unspecified: Secondary | ICD-10-CM | POA: Diagnosis not present

## 2023-01-08 DIAGNOSIS — N1832 Chronic kidney disease, stage 3b: Secondary | ICD-10-CM | POA: Diagnosis not present

## 2023-01-08 DIAGNOSIS — M059 Rheumatoid arthritis with rheumatoid factor, unspecified: Secondary | ICD-10-CM | POA: Diagnosis not present

## 2023-01-08 DIAGNOSIS — D631 Anemia in chronic kidney disease: Secondary | ICD-10-CM | POA: Diagnosis not present

## 2023-01-08 DIAGNOSIS — E1122 Type 2 diabetes mellitus with diabetic chronic kidney disease: Secondary | ICD-10-CM | POA: Diagnosis not present

## 2023-01-08 DIAGNOSIS — A419 Sepsis, unspecified organism: Secondary | ICD-10-CM | POA: Diagnosis not present

## 2023-01-08 DIAGNOSIS — J45901 Unspecified asthma with (acute) exacerbation: Secondary | ICD-10-CM | POA: Diagnosis not present

## 2023-01-08 NOTE — Addendum Note (Signed)
Addended by: Briant Cedar L on: 01/08/2023 12:06 PM   Modules accepted: Orders

## 2023-01-09 DIAGNOSIS — I5022 Chronic systolic (congestive) heart failure: Secondary | ICD-10-CM | POA: Diagnosis not present

## 2023-01-09 DIAGNOSIS — A419 Sepsis, unspecified organism: Secondary | ICD-10-CM | POA: Diagnosis not present

## 2023-01-09 DIAGNOSIS — M059 Rheumatoid arthritis with rheumatoid factor, unspecified: Secondary | ICD-10-CM | POA: Diagnosis not present

## 2023-01-09 DIAGNOSIS — J45901 Unspecified asthma with (acute) exacerbation: Secondary | ICD-10-CM | POA: Diagnosis not present

## 2023-01-09 DIAGNOSIS — D631 Anemia in chronic kidney disease: Secondary | ICD-10-CM | POA: Diagnosis not present

## 2023-01-09 DIAGNOSIS — E785 Hyperlipidemia, unspecified: Secondary | ICD-10-CM | POA: Diagnosis not present

## 2023-01-09 DIAGNOSIS — I130 Hypertensive heart and chronic kidney disease with heart failure and stage 1 through stage 4 chronic kidney disease, or unspecified chronic kidney disease: Secondary | ICD-10-CM | POA: Diagnosis not present

## 2023-01-09 DIAGNOSIS — N1832 Chronic kidney disease, stage 3b: Secondary | ICD-10-CM | POA: Diagnosis not present

## 2023-01-09 DIAGNOSIS — E1122 Type 2 diabetes mellitus with diabetic chronic kidney disease: Secondary | ICD-10-CM | POA: Diagnosis not present

## 2023-01-14 ENCOUNTER — Telehealth: Payer: Self-pay | Admitting: *Deleted

## 2023-01-14 DIAGNOSIS — E1122 Type 2 diabetes mellitus with diabetic chronic kidney disease: Secondary | ICD-10-CM | POA: Diagnosis not present

## 2023-01-14 DIAGNOSIS — I130 Hypertensive heart and chronic kidney disease with heart failure and stage 1 through stage 4 chronic kidney disease, or unspecified chronic kidney disease: Secondary | ICD-10-CM | POA: Diagnosis not present

## 2023-01-14 DIAGNOSIS — N1832 Chronic kidney disease, stage 3b: Secondary | ICD-10-CM | POA: Diagnosis not present

## 2023-01-14 DIAGNOSIS — J45901 Unspecified asthma with (acute) exacerbation: Secondary | ICD-10-CM | POA: Diagnosis not present

## 2023-01-14 DIAGNOSIS — D631 Anemia in chronic kidney disease: Secondary | ICD-10-CM | POA: Diagnosis not present

## 2023-01-14 DIAGNOSIS — E785 Hyperlipidemia, unspecified: Secondary | ICD-10-CM | POA: Diagnosis not present

## 2023-01-14 DIAGNOSIS — M059 Rheumatoid arthritis with rheumatoid factor, unspecified: Secondary | ICD-10-CM | POA: Diagnosis not present

## 2023-01-14 DIAGNOSIS — A419 Sepsis, unspecified organism: Secondary | ICD-10-CM | POA: Diagnosis not present

## 2023-01-14 DIAGNOSIS — I5022 Chronic systolic (congestive) heart failure: Secondary | ICD-10-CM | POA: Diagnosis not present

## 2023-01-14 MED ORDER — HIZENTRA 10 GM/50ML ~~LOC~~ SOLN: 15.0000 g | SUBCUTANEOUS | 11 refills | Status: DC

## 2023-01-14 NOTE — Telephone Encounter (Signed)
-----   Message from [name] sent at 01/10/2023  6:02 PM EST ----- Please inform Ebony Scott that her immunoglobulin levels have returned and she still does not have a immunoglobulin level and a adequate range especially given the fact that she had a recent pneumonia.increase to 15 gm weekly from 13 gm weekly

## 2023-01-14 NOTE — Telephone Encounter (Signed)
L/m for patient to contact me to advise new Rx to [facility] for 15 grams weekly

## 2023-01-15 ENCOUNTER — Telehealth: Payer: Self-pay | Admitting: Family Medicine

## 2023-01-15 DIAGNOSIS — G4736 Sleep related hypoventilation in conditions classified elsewhere: Secondary | ICD-10-CM | POA: Diagnosis not present

## 2023-01-15 DIAGNOSIS — J471 Bronchiectasis with (acute) exacerbation: Secondary | ICD-10-CM | POA: Diagnosis not present

## 2023-01-15 DIAGNOSIS — M0600 Rheumatoid arthritis without rheumatoid factor, unspecified site: Secondary | ICD-10-CM | POA: Diagnosis not present

## 2023-01-15 DIAGNOSIS — J4540 Moderate persistent asthma, uncomplicated: Secondary | ICD-10-CM | POA: Diagnosis not present

## 2023-01-15 DIAGNOSIS — R0602 Shortness of breath: Secondary | ICD-10-CM | POA: Diagnosis not present

## 2023-01-15 DIAGNOSIS — G4733 Obstructive sleep apnea (adult) (pediatric): Secondary | ICD-10-CM | POA: Diagnosis not present

## 2023-01-15 DIAGNOSIS — I5032 Chronic diastolic (congestive) heart failure: Secondary | ICD-10-CM | POA: Diagnosis not present

## 2023-01-15 DIAGNOSIS — J45909 Unspecified asthma, uncomplicated: Secondary | ICD-10-CM | POA: Diagnosis not present

## 2023-01-15 DIAGNOSIS — G894 Chronic pain syndrome: Secondary | ICD-10-CM | POA: Diagnosis not present

## 2023-01-15 NOTE — Telephone Encounter (Signed)
Copied from CRM [id number]. Topic: Appointments - Scheduling Inquiry for Clinic >> Jan 15, 2023  2:17 PM Colletta Maryland S wrote: Reason for CRM: pt was calling to inquire if appt on 12/2 can be telehealth due to not having transportation

## 2023-01-18 ENCOUNTER — Other Ambulatory Visit: Payer: Self-pay | Admitting: Family Medicine

## 2023-01-18 DIAGNOSIS — I5020 Unspecified systolic (congestive) heart failure: Secondary | ICD-10-CM

## 2023-01-20 ENCOUNTER — Other Ambulatory Visit: Payer: Self-pay | Admitting: Internal Medicine

## 2023-01-21 ENCOUNTER — Ambulatory Visit: Payer: Medicare PPO | Admitting: Family Medicine

## 2023-01-21 ENCOUNTER — Telehealth: Payer: Self-pay | Admitting: *Deleted

## 2023-01-21 DIAGNOSIS — Z7984 Long term (current) use of oral hypoglycemic drugs: Secondary | ICD-10-CM

## 2023-01-21 DIAGNOSIS — D631 Anemia in chronic kidney disease: Secondary | ICD-10-CM | POA: Diagnosis not present

## 2023-01-21 DIAGNOSIS — I5020 Unspecified systolic (congestive) heart failure: Secondary | ICD-10-CM | POA: Diagnosis not present

## 2023-01-21 DIAGNOSIS — E1169 Type 2 diabetes mellitus with other specified complication: Secondary | ICD-10-CM | POA: Diagnosis not present

## 2023-01-21 DIAGNOSIS — I5022 Chronic systolic (congestive) heart failure: Secondary | ICD-10-CM | POA: Diagnosis not present

## 2023-01-21 DIAGNOSIS — M0600 Rheumatoid arthritis without rheumatoid factor, unspecified site: Secondary | ICD-10-CM | POA: Diagnosis not present

## 2023-01-21 DIAGNOSIS — N1832 Chronic kidney disease, stage 3b: Secondary | ICD-10-CM

## 2023-01-21 DIAGNOSIS — M059 Rheumatoid arthritis with rheumatoid factor, unspecified: Secondary | ICD-10-CM | POA: Diagnosis not present

## 2023-01-21 DIAGNOSIS — E1122 Type 2 diabetes mellitus with diabetic chronic kidney disease: Secondary | ICD-10-CM | POA: Diagnosis not present

## 2023-01-21 DIAGNOSIS — A419 Sepsis, unspecified organism: Secondary | ICD-10-CM | POA: Diagnosis not present

## 2023-01-21 DIAGNOSIS — E785 Hyperlipidemia, unspecified: Secondary | ICD-10-CM | POA: Diagnosis not present

## 2023-01-21 DIAGNOSIS — J45901 Unspecified asthma with (acute) exacerbation: Secondary | ICD-10-CM | POA: Diagnosis not present

## 2023-01-21 DIAGNOSIS — I130 Hypertensive heart and chronic kidney disease with heart failure and stage 1 through stage 4 chronic kidney disease, or unspecified chronic kidney disease: Secondary | ICD-10-CM | POA: Diagnosis not present

## 2023-01-21 NOTE — Progress Notes (Signed)
Subjective:    Patient ID: Ebony Scott, female    DOB: [birth year], 72 y.o.   MRN: [id number]  HPI  Patient is being seen today as a telephone visit.  Patient is currently at home.  I am currently in my office.  She consents to be seen via telephone.  Phone call began at 1137.  Phone call concluded at 1155.  Patient states that she is unable to get transportation to the office.  She was admitted to the hospital in October and diagnosed via CT scan with delirium/toxic metabolic encephalopathy secondary to community-acquired pneumonia.  Since that hospitalization, the patient has been started on Trelegy for her asthma.  This was started by her asthma specialist.  She denies any fevers or chills.  She denies any chest pain.  She does report chronic tenderness of breath.  She also has a history of congestive heart failure.  Ejection fraction was 30 to 35%.  She is currently on BiDil twice daily and Entresto 24/26 twice daily.  Her blood pressure today at home was 98/60.  She denies any dizziness.  She denies any orthopnea.  She denies any paroxysmal nocturnal dyspnea.  However her blood pressure will not allow any additional titration of the Entresto.  She is on Jardiance 10 mg a day both for diabetes mellitus as well as her congestive heart failure.  She is unable to tolerate beta-blockers due to her severe obstructive lung disease.  She questions why she has got pneumonia so frequently.  She does have a compromised immune system and continues to be on prednisone for seronegative rheumatoid arthritis.  She does not have an appointment to see her rheumatologist.  I have recommended that she discuss possibly weaning down the prednisone.  She denies any joint pain today.  Her pain at the present time is limited to neuropathic pain in both feet that occurs on a daily basis. Past Medical History:  Diagnosis Date   Anemia    Angio-edema    Breast cancer (HCC) 1998   Left breast, in remission   Bronchiectasis  (HCC)    CHF (congestive heart failure) (HCC)    Clostridium difficile colitis 01/14/2018   Coronary artery disease    Depression    "some; don't take anything for it" (02/20/2012), pt denies dx as of 06/02/21   Diverticulosis    DVT (deep venous thrombosis) (HCC)    Fibromyalgia 11/2011   GAVE (gastric antral vascular ectasia)    GERD (gastroesophageal reflux disease)    Headache(784.0)    "related to allergies; more at different times during the year" (02/20/2012)   Hemorrhoids    Hiatal hernia    back and neck   History of blood transfusion 2023   1 unit, 5 units of Iron   Hx of adenomatous colonic polyps 04/12/2016   Hypercholesteremia    good cholesterol is high   Hypothyroidism    h/o Graves disease   IBS (irritable bowel syndrome)    Moderate persistent asthma    -FeV1 72% 2011, -IgE 102 2011, CT sinus Neg 2011   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 Casimer Lanius   Sleep apnea    uses cpap nightly   Tracheobronchomalacia    Past Surgical History:  Procedure Laterality Date   ABDOMINAL HYSTERECTOMY  N/A    Phreesia 10/21/2019   ANTERIOR AND POSTERIOR REPAIR  1990's   APPENDECTOMY     ARGON LASER APPLICATION  02/05/2021   Procedure: ARGON LASER APPLICATION;  Surgeon: Benancio Deeds, MD;  [facility];  Service: Gastroenterology;;   BREAST LUMPECTOMY  1998   left   BREAST SURGERY N/A    Phreesia 10/21/2019   BRONCHIAL WASHINGS  04/05/2020   Procedure: BRONCHIAL WASHINGS;  Surgeon: Martina Sinner, MD;  [facility];  Service: Pulmonary;;   CAPSULOTOMY Right 08/28/2021   Procedure: CAPSULOTOMY;  Surgeon: Candelaria Stagers, DPM;  [facility];  Service: Podiatry;  Laterality: Right;   CARPOMETACARPEL (CMC) FUSION OF THUMB WITH AUTOGRAFT FROM RADIUS  ~ 2009   "both  thumbs" (02/20/2012)   CATARACT EXTRACTION W/ INTRAOCULAR LENS  IMPLANT, BILATERAL  2012   CERVICAL DISCECTOMY  10/2001   C5-C6   CERVICAL FUSION  2003   C3-C4   CHOLECYSTECTOMY     COLONOSCOPY     CORONARY STENT INTERVENTION N/A 01/11/2021   Procedure: CORONARY STENT INTERVENTION;  Surgeon: Marykay Lex, MD;  Location: [facility] INVASIVE CV LAB;  Service: Cardiovascular;  Laterality: N/A;   DEBRIDEMENT TENNIS ELBOW  ?1970's   right   ESOPHAGOGASTRODUODENOSCOPY     ESOPHAGOGASTRODUODENOSCOPY (EGD) WITH PROPOFOL N/A 02/05/2021   Procedure: ESOPHAGOGASTRODUODENOSCOPY (EGD) WITH PROPOFOL;  Surgeon: Benancio Deeds, MD;  [facility];  Service: Gastroenterology;  Laterality: N/A;   EYE SURGERY N/A    Phreesia 10/21/2019   HAMMER TOE SURGERY Right 08/28/2021   Procedure: HAMMER TOE REPAIR 2ND TOE RIGHT FOOT;  Surgeon: Candelaria Stagers, DPM;  [facility];  Service: Podiatry;  Laterality: Right;   HYSTERECTOMY     I & D EXTREMITY Right 09/22/2020   Procedure: IRRIGATION AND DEBRIDEMENT RIGHT HAND AND ELBOW;  Surgeon: Teryl Lucy, MD;  [facility];  Service: Orthopedics;  Laterality: Right;   ICD IMPLANT N/A 08/27/2022   Procedure: ICD IMPLANT;  Surgeon: Maurice Small, MD;  [facility];  Service: Cardiovascular;  Laterality: N/A;   KNEE ARTHROPLASTY  ?1990's   "?right; w/cartilage repair" (02/20/2012)   MASS EXCISION Left 08/28/2021   Procedure: EXCISION OF SOFT TISSUE  MASS LEFT FOOT;  Surgeon: Candelaria Stagers, DPM;  [facility];  Service: Podiatry;  Laterality: Left;   NASAL SEPTUM SURGERY  1980's   POSTERIOR CERVICAL FUSION/FORAMINOTOMY  2004   "failed initial fusion; rewired  anterior neck" (02/20/2012)   REVERSE SHOULDER ARTHROPLASTY Right 02/16/2020   Procedure: REVERSE SHOULDER ARTHROPLASTY;  Surgeon: Teryl Lucy, MD;  [facility];  Service: Orthopedics;  Laterality: Right;   RIGHT/LEFT HEART CATH AND CORONARY ANGIOGRAPHY N/A 01/11/2021    Procedure: RIGHT/LEFT HEART CATH AND CORONARY ANGIOGRAPHY;  Surgeon: Marykay Lex, MD;  Location: [facility] INVASIVE CV LAB;  Service: Cardiovascular;  Laterality: N/A;   SPINE SURGERY N/A    Phreesia 10/21/2019   TONSILLECTOMY  ~ 1953   VESICOVAGINAL FISTULA CLOSURE W/ TAH  1988   VIDEO BRONCHOSCOPY Bilateral 08/23/2016   Procedure: VIDEO BRONCHOSCOPY WITH FLUORO;  Surgeon: Roslynn Amble, MD;  [facility];  Service: Cardiopulmonary;  Laterality: Bilateral;   VIDEO BRONCHOSCOPY N/A 04/05/2020   Procedure: VIDEO BRONCHOSCOPY WITHOUT FLUORO;  Surgeon: Martina Sinner, MD;  [facility];  Service: Pulmonary;  Laterality: N/A;   Current Outpatient Medications on File Prior to Visit  Medication Sig Dispense Refill   acetaminophen (TYLENOL) 500 MG tablet  Take 1,000 mg by mouth as needed for moderate pain.     albuterol (PROVENTIL) (2.5 MG/3ML) 0.083% nebulizer solution Take 3 mLs (2.5 mg total) by nebulization every 4 (four) hours as needed for wheezing or shortness of breath. 150 mL 1   albuterol (VENTOLIN HFA) 108 (90 Base) MCG/ACT inhaler Inhale 2 puffs into the lungs every 6 (six) hours as needed for wheezing or shortness of breath. 18 g 1   allopurinol (ZYLOPRIM) 100 MG tablet TAKE 1 TABLET BY MOUTH DAILY (Patient taking differently: Take 100 mg by mouth every evening.) 90 tablet 0   Alpha-D-Galactosidase (BEANO PO) Take 2-3 tablets by mouth 3 (three) times daily as needed (Gas).     Alpha-Lipoic Acid 600 MG TABS Take 600 mg by mouth in the morning.     atorvastatin (LIPITOR) 80 MG tablet TAKE 1 TABLET BY MOUTH DAILY (Patient taking differently: Take 80 mg by mouth every evening.) 90 tablet 0   azelastine (ASTELIN) 0.1 % nasal spray Place 2 sprays into both nostrils daily. Use in each nostril as directed (Patient taking differently: Place 2 sprays into both nostrils at bedtime as needed for allergies or rhinitis. Use in each nostril as directed) 30 mL 5   benzonatate  (TESSALON) 200 MG capsule TAKE 1 CAPSULE BY MOUTH THREE TIMES DAILY AS NEEDED FOR COUGH (Patient taking differently: Take 200 mg by mouth 2 (two) times daily as needed for cough.) 30 capsule 0   cetirizine (ZYRTEC) 10 MG tablet Take 1 tablet (10 mg total) by mouth 2 (two) times daily as needed for allergies (Can take a n extra dose during flare ups.). (Patient taking differently: Take 10 mg by mouth at bedtime.) 60 tablet 5   Cholecalciferol (VITAMIN D3) 25 MCG (1000 UT) capsule Take 1,000 Units by mouth at bedtime.     clopidogrel (PLAVIX) 75 MG tablet Take 1 tablet (75 mg total) by mouth daily. (Patient taking differently: Take 75 mg by mouth in the morning.) 90 tablet 3   cyproheptadine (PERIACTIN) 4 MG tablet Take 3 tablets (12 mg total) by mouth at bedtime. 90 tablet 1   denosumab (PROLIA) 60 MG/ML SOSY injection Inject 60 mg into the skin every 6 (six) months.     dexlansoprazole (DEXILANT) 60 MG capsule Take 1 capsule (60 mg total) by mouth in the morning. 90 capsule 1   diclofenac Sodium (VOLTAREN) 1 % GEL Apply 1 Application topically as needed (pain).     DULoxetine (CYMBALTA) 20 MG capsule Take 1 capsule (20 mg total) by mouth daily.     empagliflozin (JARDIANCE) 10 MG TABS tablet Take 1 tablet (10 mg total) by mouth daily before breakfast. 90 tablet 1   EPINEPHRINE 0.3 mg/0.3 mL IJ SOAJ injection USE AS DIRECTED BY YOUR PHYSICIAN INTRAMUSCULARLY AS NEEDED FOR ANAPHYLAXIS 2 each 1   famotidine (PEPCID) 20 MG tablet Take 1 tablet (20 mg total) by mouth every morning. TAKE 1 TABLET BY MOUTH EACH NIGHT AT BEDTIME Strength: 20 mg 90 tablet 1   Fluticasone-Umeclidin-Vilant (TRELEGY ELLIPTA) 200-62.5-25 MCG/ACT AEPB Inhale 1 Inhalation into the lungs in the morning. 84 each 1   folic acid (FOLVITE) 1 MG tablet Take 1 tablet (1 mg total) by mouth daily. (Patient taking differently: Take 1 mg by mouth in the morning.) 90 tablet 3   furosemide (LASIX) 40 MG tablet Take 40 mg by mouth daily.      gabapentin (NEURONTIN) 300 MG capsule Take 1 capsule (300 mg total) by mouth at bedtime.  guaiFENesin (MUCINEX) 600 MG 12 hr tablet Take 600 mg by mouth 2 (two) times daily.     HIZENTRA 10 GM/50ML SOLN Inject 15 g into the skin once a week. 13 g Infusion 300 mL 11   ipratropium-albuterol (DUONEB) 0.5-2.5 (3) MG/3ML SOLN Take 3 mLs by nebulization every 6 (six) hours as needed. 360 mL 6   isosorbide-hydrALAZINE (BIDIL) 20-37.5 MG tablet Take 1 tablet by mouth 2 (two) times daily. 180 tablet 3   Lactase (LACTOSE FAST ACTING RELIEF PO) Take 2-3 tablets by mouth 3 (three) times daily before meals. For dairy food or drink     magnesium oxide (MAG-OX) 400 (240 Mg) MG tablet Take 400 mg by mouth at bedtime.     montelukast (SINGULAIR) 10 MG tablet Take 1 tablet (10 mg total) by mouth daily after supper. 90 tablet 1   mupirocin ointment (BACTROBAN) 2 % Apply 1 Application topically as needed (wounds). 30 g 1   potassium chloride (KLOR-CON) 10 MEQ tablet Take 10 mEq by mouth daily.     predniSONE (DELTASONE) 1 MG tablet TAKE 2 TABLETS (2MG ) BY MOUTH ONCE DAILY WITH BREAKFAST. TAKE ALONG WITH 5MG  DAILY 180 tablet 0   predniSONE (DELTASONE) 5 MG tablet tAKE 1 TABLET (5MG ) BY MOUTH ONCE DAILY WITH BREAKFAST 90 tablet 0   Prenatal Vit-Fe Fumarate-FA (PRENATAL VITAMINS PO) Take 1 tablet by mouth in the morning.     Probiotic Product (ALIGN) 4 MG CAPS Take 4 mg by mouth every evening.     Respiratory Therapy Supplies (NEBULIZER MASK ADULT) MISC 1 Device by Does not apply route as directed. 1 each 1   sacubitril-valsartan (ENTRESTO) 24-26 MG Take 1 tablet by mouth 2 (two) times daily. 180 tablet 1   Tiotropium Bromide-Olodaterol (STIOLTO RESPIMAT) 2.5-2.5 MCG/ACT AERS Inhale 2 puffs into the lungs daily. (Patient taking differently: Inhale 2 puffs into the lungs at bedtime.) 4 g 11   traMADol (ULTRAM) 50 MG tablet Take 1 tablet (50 mg total) by mouth at bedtime. 30 tablet 0   traZODone (DESYREL) 50 MG tablet  Take 1 tablet (50 mg total) by mouth at bedtime. 90 tablet 0   XARELTO 20 MG TABS tablet TAKE 1 TABLET BY MOUTH DAILY WITH SUPPER (Patient taking differently: Take 20 mg by mouth daily with supper.) 90 tablet 2   Current Facility-Administered Medications on File Prior to Visit  Medication Dose Route Frequency Provider Last Rate Last Admin   iohexol (OMNIPAQUE) 350 MG/ML injection    PRN Marykay Lex, MD   105 mL at 01/11/21 0955   Allergies  Allergen Reactions   Baclofen Anaphylaxis and Other (See Comments)    Altered mental status requiring a 3-day hospital stay- patient became unresponsive   "Put me in a coma for five days", Altered mental status requiring a 3-day hospital stay- patient became unresponsive   Dust Mite Extract Shortness Of Breath and Other (See Comments)    "sneezing" (02/20/2012) too   Molds & Smuts Shortness Of Breath   Morphine And Codeine Hives and Itching   Other Shortness Of Breath and Other (See Comments)    Grass and weeds "sneezing; filled sinuses" (02/20/2012)   Penicillins Rash and Other (See Comments)    "welts" (02/20/2012)  Tolerated Ancef 02/16/20   Rofecoxib Swelling and Other (See Comments)    Vioxx- feet swelling   Shellfish Allergy Anaphylaxis, Shortness Of Breath, Itching and Rash   Shrimp Extract Anaphylaxis and Other (See Comments)    ALL SHELLFISH  Tetracycline Hcl Nausea And Vomiting   Tetracycline Hcl Other (See Comments)    GI Intolerance   Xolair [Omalizumab] Other (See Comments)    Caused Blood clot   Zoledronic Acid Other (See Comments)    Reclast- Fever, Put in hospital, dr said it was a reaction from a reaction    Fever, inability to walk, Put in hospital  Fever, Put in hospital, dr said it was a reaction from a reaction , Other reaction(s): Unknown   Dilaudid [Hydromorphone Hcl] Itching   Hydrocodone-Acetaminophen Itching   Levofloxacin Other (See Comments)    GI upset   Oxycodone Hcl Nausea And Vomiting    GI Intolerance    Paroxetine Nausea And Vomiting and Other (See Comments)    GI Intolerance also   Celecoxib Swelling and Other (See Comments)    Feet swelling   Diltiazem Hcl Swelling   Lactose Other (See Comments)    Bloating and gas   Oxycodone-Acetaminophen Itching   Pork-Derived Products Other (See Comments)   Rituximab Other (See Comments)    Anemia    Tree Extract Other (See Comments)    "tested and told I was allergic to it; never experienced a reaction to it" (02/20/2012)   Gamunex [Immune Globulin] Itching and Rash    Tolerates hizentra    Penicillin G Procaine Rash and Other (See Comments)    Welts, also   Social History   Socioeconomic History   Marital status: Divorced    Spouse name: Not on file   Number of children: 2   Years of education: college   Highest education level: Not on file  Occupational History   Occupation: Disabled    Comment: Retired Dispensing optician: RETIRED  Tobacco Use   Smoking status: Never    Passive exposure: Past   Smokeless tobacco: Never   Tobacco comments:    Parents  Vaping Use   Vaping status: Never Used  Substance and Sexual Activity   Alcohol use: Not Currently    Alcohol/week: 0.0 standard drinks of alcohol   Drug use: No   Sexual activity: Not Currently    Birth control/protection: Surgical    Comment: Hysterectomy  Other Topics Concern   Not on file  Social History Narrative   Patient lives at home alone. Patient  divorced.    Patient has her BS degree.   Right handed.   Caffeine- sometimes coffee.      [facility]:   Born in [place], [place]. She worked as a Armed forces operational officer. She has no pets currently. She does have indoor plants. Previously had mold in her home that was remediated. Carpet was removed.          Social Determinants of Health   Financial Resource Strain: Low Risk  (11/22/2022)   Overall Financial Resource Strain (CARDIA)    Difficulty of Paying Living Expenses: Not hard at all   Food Insecurity: No Food Insecurity (11/30/2022)   Hunger Vital Sign    Worried About Running Out of Food in the Last Year: Never true    Ran Out of Food in the Last Year: Never true  Transportation Needs: No Transportation Needs (11/30/2022)   PRAPARE - Administrator, Civil Service (Medical): No    Lack of Transportation (Non-Medical): No  Physical Activity: Insufficiently Active (11/22/2022)   Exercise Vital Sign    Days of Exercise per Week: 3 days    Minutes of Exercise per Session: 30 min  Stress: No Stress Concern Present (11/22/2022)   [employer] of Occupational Health - Occupational Stress Questionnaire    Feeling of Stress : Not at all  Social Connections: Moderately Isolated (11/22/2022)   Social Connection and Isolation Panel [NHANES]    Frequency of Communication with Friends and Family: More than three times a week    Frequency of Social Gatherings with Friends and Family: Three times a week    Attends Religious Services: 1 to 4 times per year    Active Member of Clubs or Organizations: No    Attends Banker Meetings: Never    Marital Status: Divorced  Catering manager Violence: Not At Risk (11/30/2022)   Humiliation, Afraid, Rape, and Kick questionnaire    Fear of Current or Ex-Partner: No    Emotionally Abused: No    Physically Abused: No    Sexually Abused: No     Review of Systems  All other systems reviewed and are negative.      Objective:   Physical Exam  Telephone visit      Assessment & Plan:  HFrEF (heart failure with reduced ejection fraction) (HCC)  Stage 3b chronic kidney disease (HCC)  Seronegative rheumatoid arthritis (HCC)  Type 2 diabetes mellitus with other specified complication, unspecified whether long term insulin use (HCC) I have recommended that the patient discuss with her rheumatologist weaning down on her prednisone given the fact she is having no pain at the present time related to the  rheumatoid arthritis.  I do believe that her pneumonia is partly due to her chronic immunosuppression as well as her underlying obstructive lung disease.  She is on maximal medical therapy with Trelegy.  Patient also has congestive heart failure.  However, her blood pressure prevents further titration of Entresto.  Her obstructive lung disease prevents beta-blocker therapy.  She is currently on BiDil, [place], and Jardiance.  She is due for fasting lab work.  I would like her to come by at her earliest convenience for this.

## 2023-01-21 NOTE — Telephone Encounter (Signed)
Last Fill: 11/01/2022  Next Visit: 03/13/2023  Last Visit: 12/11/2022  Dx: Rheumatoid arthritis with negative rheumatoid factor, involving unspecified site   Current Dose per office note on 12/11/2022: prednisone 7 mg daily.   Okay to refill Prednisone?

## 2023-01-21 NOTE — Telephone Encounter (Signed)
Patient contacted the office and left message stating her PCP Dr. Tanya Nones has requested we reduce her Prednisone. Patient would like to know what you advise and is requesting a call back.

## 2023-01-22 NOTE — Telephone Encounter (Signed)
Requested Prescriptions  Pending Prescriptions Disp Refills   pantoprazole (PROTONIX) 40 MG tablet [Pharmacy Med Name: pantoprazole 40 mg tablet,delayed release] 30 tablet     Sig: TAKE 1 TABLET BY MOUTH DAILY     Gastroenterology: Proton Pump Inhibitors Failed - 01/18/2023 11:56 AM      Failed - Valid encounter within last 12 months    Recent Outpatient Visits           1 year ago Chronic fatigue   [facility] Ebony Scott, Ebony Scott, Ebony Scott   1 year ago Anemia, unspecified type   [facility] Ebony Scott, Ebony Scott, Ebony Scott   1 year ago Anemia, unspecified type   [facility] Ebony Scott, Ebony Scott   2 years ago Type 2 diabetes mellitus with other specified complication, unspecified whether long term insulin use (HCC)   [facility] Ebony Scott, Ebony Scott   2 years ago Chronic combined systolic and diastolic CHF (congestive heart failure) (HCC)   [facility] Scott, Ebony Scott, Ebony Scott       Future Appointments             In 1 week Ebony Scott [place], Ebony Scott [facility] [place]   In 1 month Ebony Scott [place], Ebony Scott [facility] [place]. [facility]             JARDIANCE 10 MG TABS tablet [Pharmacy Med Name: Jardiance 10 mg tablet] 90 tablet 1    Sig: TAKE ONE TABLET BY MOUTH DAILY BEFORE BREAKFAST     Endocrinology:  Diabetes - SGLT2 Inhibitors Failed - 01/18/2023 11:56 AM      Failed - eGFR in normal range and within 360 days    GFR, Est African American  Date Value Ref Range Status  08/25/2020 40 (L) > OR = 60 mL/min/1.36m2 Final   GFR, Est Non African American  Date Value Ref Range Status  08/25/2020 34 (L) > OR = 60 mL/min/1.61m2 Final   GFR, Estimated  Date Value Ref Range Status  12/06/2022 60 (L) >60 mL/min Final    Comment:    (NOTE) Calculated using the CKD-EPI Creatinine Equation (2021)   06/14/2022 51 (L) >60 mL/min Final     Comment:    (NOTE) Calculated using the CKD-EPI Creatinine Equation (2021)    GFR  Date Value Ref Range Status  05/03/2021 38.39 (L) >60.00 mL/min Final    Comment:    Calculated using the CKD-EPI Creatinine Equation (2021)   eGFR  Date Value Ref Range Status  09/11/2022 36 (L) > OR = 60 mL/min/1.50m2 Final  08/20/2022 38 (L) >59 mL/min/1.73 Final         Failed - Valid encounter within last 6 months    Recent Outpatient Visits           1 year ago Chronic fatigue   [facility] Eb[facility] Scott, Ebony Scott, Ebony Scott   1 year ago Anemia, unspecified type   [facility] Ebony Scott, Ebony Scott   1 year ago Anemia, unspecified type   [facility] Ebony Scott, Ebony Scott   2 years ago Type 2 diabetes mellitus with other specified complication, unspecified whether long term insulin use (HCC)   [facility] Eb[facility] Scott, Ebony Scott   2 years ago Chronic combined systolic and diastolic CHF (congestive heart failure) (HCC)   [facility] Ebony Scott, Ebony Scott, Ebony Scott       Future Appointments             In 1 week Ebony Scott [place], Ebony Scott [facility] [place]   In 1 month Ebony Scott [place], Ebony Scott [facility] Scott. [facility]            Passed - Cr in normal range and within 360 days    Creat  Date Value Ref Range Status  09/11/2022 1.51 (H) 0.60 - 1.00 mg/dL Final   Creatinine, Ser  Date Value Ref Range Status  12/06/2022 0.99 0.44 - 1.00 mg/dL Final   Creatinine, Urine  Date Value Ref Range Status  05/31/2014 17.81 (L) >20.0 mg/dL Final         Passed - HBA1C is between 0 and 7.9 and within 180 days    Hgb A1c MFr Bld  Date Value Ref Range Status  11/30/2022 6.0 (H) 4.8 - 5.6 % Final    Comment:    (NOTE) Pre diabetes:          5.7%-6.4%  Diabetes:              >6.4%  Glycemic control for   <7.0% adults with diabetes

## 2023-01-23 ENCOUNTER — Telehealth: Payer: Self-pay

## 2023-01-23 NOTE — Telephone Encounter (Signed)
L/m for patient about increase and to let me know that she has gotten the dose increase with next shipment

## 2023-01-23 NOTE — Telephone Encounter (Signed)
Copied from CRM [id number]. Topic: Clinical - Medication Refill >> Jan 23, 2023 11:08 AM Tiffany H wrote: Most Recent Primary Care Visit:  Provider: Lynnea Ferrier T  Department: [facility]  Visit Type: TELEPHONE OFFICE VISIT  Date: 01/21/2023  Medication: traMADol (ULTRAM) 50 MG tablet  Has the patient contacted their pharmacy? Yes (Agent: If no, request that the patient contact the pharmacy for the refill. If patient does not wish to contact the pharmacy document the reason why and proceed with request.) (Agent: If yes, when and what did the pharmacy advise?) Patient was out of refills.   Is this the correct pharmacy for this prescription? Yes If no, delete pharmacy and type the correct one.   This is the patient's preferred pharmacy:  [facility] - [place], [place] - [address] [place] [address] Phone: [phone] Fax: [phone]   Has the prescription been filled recently? No  Is the patient out of the medication? Yes  Has the patient been seen for an appointment in the last year OR does the patient have an upcoming appointment? Yes  Can we respond through MyChart? Yes  Agent: Please be advised that Rx refills may take up to 3 business days. We ask that you follow-up with your pharmacy.

## 2023-01-24 ENCOUNTER — Other Ambulatory Visit: Payer: Self-pay | Admitting: Family Medicine

## 2023-01-24 DIAGNOSIS — M19011 Primary osteoarthritis, right shoulder: Secondary | ICD-10-CM

## 2023-01-24 DIAGNOSIS — M1120 Other chondrocalcinosis, unspecified site: Secondary | ICD-10-CM

## 2023-01-24 MED ORDER — TRAMADOL HCL 50 MG PO TABS: 50.0000 mg | ORAL_TABLET | Freq: Every day | ORAL | 0 refills | Status: DC

## 2023-01-25 DIAGNOSIS — J4551 Severe persistent asthma with (acute) exacerbation: Secondary | ICD-10-CM | POA: Diagnosis not present

## 2023-01-29 ENCOUNTER — Ambulatory Visit: Payer: Medicare PPO | Admitting: Allergy and Immunology

## 2023-01-29 ENCOUNTER — Ambulatory Visit: Payer: Self-pay | Admitting: Family Medicine

## 2023-01-29 ENCOUNTER — Telehealth: Payer: Self-pay

## 2023-01-29 VITALS — BP 122/60 | HR 74 | Temp 98.2°F | Resp 16 | Ht 60.0 in | Wt 133.9 lb

## 2023-01-29 DIAGNOSIS — D84821 Immunodeficiency due to drugs: Secondary | ICD-10-CM

## 2023-01-29 DIAGNOSIS — J4550 Severe persistent asthma, uncomplicated: Secondary | ICD-10-CM

## 2023-01-29 DIAGNOSIS — Z888 Allergy status to other drugs, medicaments and biological substances status: Secondary | ICD-10-CM

## 2023-01-29 DIAGNOSIS — B370 Candidal stomatitis: Secondary | ICD-10-CM

## 2023-01-29 DIAGNOSIS — J479 Bronchiectasis, uncomplicated: Secondary | ICD-10-CM

## 2023-01-29 DIAGNOSIS — T380X5A Adverse effect of glucocorticoids and synthetic analogues, initial encounter: Secondary | ICD-10-CM

## 2023-01-29 DIAGNOSIS — K219 Gastro-esophageal reflux disease without esophagitis: Secondary | ICD-10-CM | POA: Diagnosis not present

## 2023-01-29 DIAGNOSIS — D801 Nonfamilial hypogammaglobulinemia: Secondary | ICD-10-CM | POA: Diagnosis not present

## 2023-01-29 DIAGNOSIS — G478 Other sleep disorders: Secondary | ICD-10-CM

## 2023-01-29 DIAGNOSIS — Z7952 Long term (current) use of systemic steroids: Secondary | ICD-10-CM

## 2023-01-29 MED ORDER — MUPIROCIN 2 % EX OINT: 1.0000 | TOPICAL_OINTMENT | CUTANEOUS | 3 refills | Status: AC | PRN

## 2023-01-29 MED ORDER — NYSTATIN 100000 UNIT/ML MT SUSP: OROMUCOSAL | 3 refills | Status: DC

## 2023-01-29 NOTE — Patient Instructions (Addendum)
  1. Continue to Treat  sleep dysfunction with Primary doctor  2. Continue to use prednisone with Rheumatologist  3. Continue to Treat laryngopharyngeal reflux:    A. Dexilant 60 mg in the morning  B. famotidine 40 mg daily  4.  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  Continue immunoglobulin infusions   5. Continue to Treat inflammation of airway:   A. Trelegy 200 - I inhalation 1 time per day  6. Treat and prevent thrush:   A. Nystatin - 5 mls swish, gargle, swallow after Trelegy use  7.  If needed:  A. Astelin and Zyrtec and nasal saline  B. Percussion vest   C. Albuterol + Fluticasone 44 - 2 inhalations TOGETHER every 4-6 hours D. Albuterol nebulization every 4-6 hours  8. Return to clinic in 8 weeks or earlier if problem  9. Repeat blood tests with next visit

## 2023-01-29 NOTE — Telephone Encounter (Signed)
Copied from CRM [id number]. Topic: Clinical - Medication Question >> Jan 29, 2023  3:25 PM Ebony Scott R wrote: Reason for CRM: Pt currently taking traZODone (DESYREL) 50 MG tablet for sleep but its not working. Asking if the doctor can prescribed something stronger as she is not able to sleep at all. Cb#

## 2023-01-29 NOTE — Telephone Encounter (Addendum)
Copied from CRM [id number]. Topic: Clinical - Medical Advice >> Jan 29, 2023  1:33 PM Fuller Mandril wrote: Reason for CRM: Pt called in due to having trouble sleeping. States he just saw allergy Dr. Lucie Leather and he recommended she reach to to PCP to see if there was anything that could be recommended to help pt sleep better.   Chief Complaint: Trouble Sleeping Disposition: [] ED /[] Urgent Care (no appt availability in office) / [] Appointment(In office/virtual)/ []  [facility]/ [] Home Care/ [] Refused Recommended Disposition /[]  Mobile Bus/ [x]  Follow-up with PCP Additional Notes: CR is a 72 year old female queued for triage, regarding trouble sleeping.  Called patient at 12:58 CST, no answer, left a voicemail, callback queued.   Called patient at 1:30 CST, no answer, left a voicemail, callback queued.   Called patient at 02:15 CST, no answer, left voicemail, 3rd attempt, routing information to Primary care office.

## 2023-01-29 NOTE — Progress Notes (Unsigned)
[place] - [place] - [place] - [place] - [place]   Follow-up Note  Referring Provider: [name], MD Primary Provider: [name], MD Date of Office Visit: 01/29/2023  Subjective:   Ebony Scott (DOB: [birth year]) is a 72 y.o. female who [facility] in re-evaluation of the following:  HPI: Amberlea returns to this clinic in evaluation of hypogammaglobulinemia, chronic rhinitis, history of asthma/bronchiectasis/tracheobronchial malacia, shellfish allergy, facial pain syndrome, sleep dysfunction, reflux.  I last saw her in this clinic 31 December 2022.  When we last saw her in this clinic she was undergoing rehabilitation for pneumonia and she is actually doing quite well at this point in time regarding her airway although she still has a little bit of coughing and wheezing in the evening.  She continues to use trilogy on a regular basis but she is not using her percussion vest.  During her last visit we checked her immunoglobulin levels and they were a little bit low so we increased her dose of immunoglobulin from 13 g to 15 g/week.  She has had very little problems with her nose.  She has had very little problems with reflux.  She has had no problems with facial pain.  She still has significant sleep dysfunction.  During her last visit we identified this is a problem and asked her to visit with her primary care doctor who gave her trazodone to replace her Periactin but this has not really helped her sleep.  She does not consume shellfish.  She continues on prednisone at 7 mg/day and it appears as though this is her primary treatment for her rheumatoid arthritis at this point in time.  Allergies as of 01/29/2023       Reactions   Baclofen Anaphylaxis, Other (See Comments)   Altered mental status requiring a 3-day hospital stay- patient became unresponsive "Put me in a coma for five days", Altered mental status  requiring a 3-day hospital stay- patient became unresponsive   Other Shortness Of Breath, Other (See Comments)   Grass and weeds "sneezing; filled sinuses" (02/20/2012)   Pork-derived Products Anaphylaxis   Shellfish Allergy Anaphylaxis, Shortness Of Breath, Itching, Rash   Shrimp Extract Anaphylaxis, Other (See Comments)   ALL SHELLFISH   Tetracycline Hcl Nausea And Vomiting   Xolair [omalizumab] Other (See Comments)   Caused Blood clot   Zoledronic Acid Other (See Comments)   Reclast- Fever, Put in hospital, dr said it was a reaction from a reaction  Fever, inability to walk, Put in hospital Fever, Put in hospital, dr said it was a reaction from a reaction , Other reaction(s): Unknown   Celecoxib Swelling   Feet swelling   Dilaudid [hydromorphone Hcl] Itching   Hydrocodone-acetaminophen Itching   Levofloxacin Other (See Comments)   GI upset   Molds & Smuts Cough   Morphine And Codeine Hives, Itching   Oxycodone Hcl Nausea And Vomiting   GI Intolerance   Paroxetine Nausea And Vomiting, Other (See Comments)   GI Intolerance also   Penicillins Rash, Other (See Comments)   "welts" (02/20/2012) Tolerated Ancef 02/16/20   Tetracycline Hcl Other (See Comments)   GI Intolerance   Oxycodone-acetaminophen Itching   Diltiazem Hcl Swelling   Dust Mite Extract Other (See Comments), Cough   "sneezing" (02/20/2012) too   Gamunex [immune Globulin] Itching, Rash   Tolerates hizentra    Lactose Other (See Comments)   Bloating and gas   Penicillin G  Procaine Rash, Other (See Comments)   Welts, also   Rituximab Other (See Comments)   Anemia    Rofecoxib Swelling   Vioxx- feet swelling   Tree Extract Itching   "tested and told I was allergic to it; never experienced a reaction to it" (02/20/2012)        Medication List    acetaminophen 500 MG tablet Commonly known as: TYLENOL Take 1,000 mg by mouth as needed for moderate pain.   albuterol 108 (90 Base) MCG/ACT inhaler Commonly known  as: VENTOLIN HFA Inhale 2 puffs into the lungs every 6 (six) hours as needed for wheezing or shortness of breath.   albuterol (2.5 MG/3ML) 0.083% nebulizer solution Commonly known as: PROVENTIL Take 3 mLs (2.5 mg total) by nebulization every 4 (four) hours as needed for wheezing or shortness of breath.   Align 4 MG Caps Take 4 mg by mouth every evening.   allopurinol 100 MG tablet Commonly known as: ZYLOPRIM TAKE 1 TABLET BY MOUTH DAILY   Alpha-Lipoic Acid 600 MG Tabs Take 600 mg by mouth in the morning.   atorvastatin 80 MG tablet Commonly known as: LIPITOR TAKE 1 TABLET BY MOUTH DAILY What changed: when to take this   azelastine 0.1 % nasal spray Commonly known as: ASTELIN Place 2 sprays into both nostrils daily. Use in each nostril as directed What changed:  when to take this reasons to take this   BEANO PO Take 2-3 tablets by mouth 3 (three) times daily as needed (Gas).   benzonatate 200 MG capsule Commonly known as: TESSALON TAKE 1 CAPSULE BY MOUTH THREE TIMES DAILY AS NEEDED FOR COUGH What changed: See the new instructions.   cetirizine 10 MG tablet Commonly known as: ZYRTEC Take 1 tablet (10 mg total) by mouth 2 (two) times daily as needed for allergies (Can take a n extra dose during flare ups.). What changed: when to take this   clopidogrel 75 MG tablet Commonly known as: PLAVIX Take 1 tablet (75 mg total) by mouth daily. What changed: when to take this   cyproheptadine 4 MG tablet Commonly known as: PERIACTIN Take 3 tablets (12 mg total) by mouth at bedtime.   denosumab 60 MG/ML Sosy injection Commonly known as: PROLIA Inject 60 mg into the skin every 6 (six) months.   dexlansoprazole 60 MG capsule Commonly known as: Dexilant Take 1 capsule (60 mg total) by mouth in the morning.   DULoxetine 20 MG capsule Commonly known as: CYMBALTA Take 1 capsule (20 mg total) by mouth daily.   EPINEPHrine 0.3 mg/0.3 mL Soaj injection Commonly known as:  EPI-PEN USE AS DIRECTED BY YOUR PHYSICIAN INTRAMUSCULARLY AS NEEDED FOR ANAPHYLAXIS   famotidine 20 MG tablet Commonly known as: PEPCID Take 1 tablet (20 mg total) by mouth every morning. TAKE 1 TABLET BY MOUTH EACH NIGHT AT BEDTIME Strength: 20 mg   folic acid 1 MG tablet Commonly known as: FOLVITE Take 1 tablet (1 mg total) by mouth daily. What changed: when to take this   furosemide 40 MG tablet Commonly known as: LASIX Take 40 mg by mouth daily.   gabapentin 300 MG capsule Commonly known as: NEURONTIN Take 1 capsule (300 mg total) by mouth at bedtime.   guaiFENesin 600 MG 12 hr tablet Commonly known as: MUCINEX Take 600 mg by mouth 2 (two) times daily.   Hizentra 10 GM/50ML Soln Generic drug: Immune Globulin (Human) Inject 15 g into the skin once a week. 13 g Infusion  ipratropium-albuterol 0.5-2.5 (3) MG/3ML Soln Commonly known as: DUONEB Take 3 mLs by nebulization every 6 (six) hours as needed.   isosorbide-hydrALAZINE 20-37.5 MG tablet Commonly known as: BIDIL Take 1 tablet by mouth 2 (two) times daily.   Jardiance 10 MG Tabs tablet Generic drug: empagliflozin TAKE ONE TABLET BY MOUTH DAILY BEFORE BREAKFAST   LACTOSE FAST ACTING RELIEF PO Take 2-3 tablets by mouth 3 (three) times daily before meals. For dairy food or drink   magnesium oxide 400 (240 Mg) MG tablet Commonly known as: MAG-OX Take 400 mg by mouth at bedtime.   montelukast 10 MG tablet Commonly known as: SINGULAIR Take 1 tablet (10 mg total) by mouth daily after supper.   mupirocin ointment 2 % Commonly known as: BACTROBAN Apply 1 Application topically as needed (wounds).   Nebulizer Mask Adult Misc 1 Device by Does not apply route as directed.   potassium chloride 10 MEQ tablet Commonly known as: KLOR-CON Take 10 mEq by mouth daily.   predniSONE 5 MG tablet Commonly known as: DELTASONE TAKE 1 TABLET (5MG ) BY MOUTH ONCE DAILY WITH BREAKFAST   predniSONE 1 MG tablet Commonly known  as: DELTASONE TAKE 2 TABLETS (2MG ) BY MOUTH ONCE DAILY WITH BREAKFAST. TAKE ALONG WITH 5MG  DAILY   PRENATAL VITAMINS PO Take 1 tablet by mouth in the morning.   sacubitril-valsartan 24-26 MG Commonly known as: ENTRESTO Take 1 tablet by mouth 2 (two) times daily.   Stiolto Respimat 2.5-2.5 MCG/ACT Aers Generic drug: Tiotropium Bromide-Olodaterol Inhale 2 puffs into the lungs daily.   traMADol 50 MG tablet Commonly known as: ULTRAM Take 1 tablet (50 mg total) by mouth at bedtime.   traZODone 50 MG tablet Commonly known as: DESYREL Take 1 tablet (50 mg total) by mouth at bedtime.   Trelegy Ellipta 200-62.5-25 MCG/ACT Aepb Generic drug: Fluticasone-Umeclidin-Vilant Inhale 1 Inhalation into the lungs in the morning.   Vitamin D3 25 MCG (1000 UT) Caps Take 1,000 Units by mouth at bedtime.   Voltaren 1 % Gel Generic drug: diclofenac Sodium Apply 1 Application topically as needed (pain).   Xarelto 20 MG Tabs tablet Generic drug: rivaroxaban TAKE 1 TABLET BY MOUTH DAILY WITH SUPPER What changed: how much to take    Past Medical History:  Diagnosis Date   Anemia    Angio-edema    Breast cancer (HCC) 1998   Left breast, in remission   Bronchiectasis (HCC)    CHF (congestive heart failure) (HCC)    Clostridium difficile colitis 01/14/2018   Coronary artery disease    Depression    "some; don't take anything for it" (02/20/2012), pt denies dx as of 06/02/21   Diverticulosis    DVT (deep venous thrombosis) (HCC)    Fibromyalgia 11/2011   GAVE (gastric antral vascular ectasia)    GERD (gastroesophageal reflux disease)    Headache(784.0)    "related to allergies; more at different times during the year" (02/20/2012)   Hemorrhoids    Hiatal hernia    back and neck   History of blood transfusion 2023   1 unit, 5 units of Iron   Hx of adenomatous colonic polyps 04/12/2016   Hypercholesteremia    good cholesterol is high   Hypothyroidism    h/o Graves disease   IBS  (irritable bowel syndrome)    Moderate persistent asthma    -FeV1 72% 2011, -IgE 102 2011, CT sinus Neg 2011   Osteomyelitis of second toe of right foot (HCC) 09/23/2020   Osteoporosis  on reclast yearly   Pneumonia    Septic olecranon bursitis of right elbow 09/22/2020   Seronegative rheumatoid arthritis (HCC)    Dr. Casimer Lanius   Sleep apnea    uses cpap nightly   Tracheobronchomalacia     Past Surgical History:  Procedure Laterality Date   ABDOMINAL HYSTERECTOMY N/A    Phreesia 10/21/2019   ANTERIOR AND POSTERIOR REPAIR  1990's   APPENDECTOMY     ARGON LASER APPLICATION  02/05/2021   Procedure: ARGON LASER APPLICATION;  Surgeon: Benancio Deeds, MD;  Location: [facility];  Service: Gastroenterology;;   BREAST LUMPECTOMY  1998   left   BREAST SURGERY N/A    Phreesia 10/21/2019   BRONCHIAL WASHINGS  04/05/2020   Procedure: BRONCHIAL WASHINGS;  Surgeon: Martina Sinner, MD;  Location: [facility];  Service: Pulmonary;;   CAPSULOTOMY Right 08/28/2021   Procedure: CAPSULOTOMY;  Surgeon: Candelaria Stagers, DPM;  Location: [facility];  Service: Podiatry;  Laterality: Right;   CARPOMETACARPEL (CMC) FUSION OF THUMB WITH AUTOGRAFT FROM RADIUS  ~ 2009   "both thumbs" (02/20/2012)   CATARACT EXTRACTION W/ INTRAOCULAR LENS  IMPLANT, BILATERAL  2012   CERVICAL DISCECTOMY  10/2001   C5-C6   CERVICAL FUSION  2003   C3-C4   CHOLECYSTECTOMY     COLONOSCOPY     CORONARY STENT INTERVENTION N/A 01/11/2021   Procedure: CORONARY STENT INTERVENTION;  Surgeon: Marykay Lex, MD;  Location: [facility] INVASIVE CV LAB;  Service: Cardiovascular;  Laterality: N/A;   DEBRIDEMENT TENNIS ELBOW  ?1970's   right   ESOPHAGOGASTRODUODENOSCOPY     ESOPHAGOGASTRODUODENOSCOPY (EGD) WITH PROPOFOL N/A 02/05/2021   Procedure: ESOPHAGOGASTRODUODENOSCOPY (EGD) WITH PROPOFOL;  Surgeon: Benancio Deeds, MD;  Location: [facility];  Service: Gastroenterology;  Laterality: N/A;   EYE SURGERY N/A    Phreesia  10/21/2019   HAMMER TOE SURGERY Right 08/28/2021   Procedure: HAMMER TOE REPAIR 2ND TOE RIGHT FOOT;  Surgeon: Candelaria Stagers, DPM;  Location: [facility];  Service: Podiatry;  Laterality: Right;   HYSTERECTOMY     I & D EXTREMITY Right 09/22/2020   Procedure: IRRIGATION AND DEBRIDEMENT RIGHT HAND AND ELBOW;  Surgeon: Teryl Lucy, MD;  Location: [facility];  Service: Orthopedics;  Laterality: Right;   ICD IMPLANT N/A 08/27/2022   Procedure: ICD IMPLANT;  Surgeon: Maurice Small, MD;  [facility]: [facility];  Service: Cardiovascular;  Laterality: N/A;   KNEE ARTHROPLASTY  ?1990's   "?right; w/cartilage repair" (02/20/2012)   MASS EXCISION Left 08/28/2021   Procedure: EXCISION OF SOFT TISSUE  MASS LEFT FOOT;  Surgeon: Candelaria Stagers, DPM;  Location: [facility];  Service: Podiatry;  Laterality: Left;   NASAL SEPTUM SURGERY  1980's   POSTERIOR CERVICAL FUSION/FORAMINOTOMY  2004   "failed initial fusion; rewired  anterior neck" (02/20/2012)   REVERSE SHOULDER ARTHROPLASTY Right 02/16/2020   Procedure: REVERSE SHOULDER ARTHROPLASTY;  Surgeon: Teryl Lucy, MD;  Location: [facility];  Service: Orthopedics;  Laterality: Right;   RIGHT/LEFT HEART CATH AND CORONARY ANGIOGRAPHY N/A 01/11/2021   Procedure: RIGHT/LEFT HEART CATH AND CORONARY ANGIOGRAPHY;  Surgeon: Marykay Lex, MD;  Location: [facility] INVASIVE CV LAB;  Service: Cardiovascular;  Laterality: N/A;   SPINE SURGERY N/A    Phreesia 10/21/2019   TONSILLECTOMY  ~ 1953   VESICOVAGINAL FISTULA CLOSURE W/ TAH  1988   VIDEO BRONCHOSCOPY Bilateral 08/23/2016   Procedure: VIDEO BRONCHOSCOPY WITH FLUORO;  Surgeon: Roslynn Amble, MD;  Location: [facility];  Service:  Cardiopulmonary;  Laterality: Bilateral;   VIDEO BRONCHOSCOPY N/A 04/05/2020   Procedure: VIDEO BRONCHOSCOPY WITHOUT FLUORO;  Surgeon: Martina Sinner, MD;  Location: [facility];  Service: Pulmonary;  Laterality: N/A;    Review of systems negative except as noted in HPI / PMHx or noted  below:  Review of Systems  Constitutional: Negative.   HENT: Negative.    Eyes: Negative.   Respiratory: Negative.    Cardiovascular: Negative.   Gastrointestinal: Negative.   Genitourinary: Negative.   Musculoskeletal: Negative.   Skin: Negative.   Neurological: Negative.   Endo/Heme/Allergies: Negative.   Psychiatric/Behavioral: Negative.       Objective:   Vitals:   01/29/23 1108  BP: 122/60  Pulse: 74  Resp: 16  Temp: 98.2 F (36.8 C)  SpO2: 96%   Height: 5' (152.4 cm)  Weight: 133 lb 14.4 oz (60.7 kg)    Physical Exam Constitutional:      Appearance: She is not diaphoretic.  HENT:     Head: Normocephalic.     Right Ear: Tympanic membrane, ear canal and external ear normal.     Left Ear: Tympanic membrane, ear canal and external ear normal.     Nose: Nose normal. No mucosal edema or rhinorrhea.     Mouth/Throat:     Pharynx: Uvula midline. No oropharyngeal exudate.     Comments: Thrush  Eyes:     Conjunctiva/sclera: Conjunctivae normal.  Neck:     Thyroid: No thyromegaly.     Trachea: Trachea normal. No tracheal tenderness or tracheal deviation.  Cardiovascular:     Rate and Rhythm: Normal rate and regular rhythm.     Heart sounds: Normal heart sounds, S1 normal and S2 normal. No murmur heard. Pulmonary:     Effort: No respiratory distress.     Breath sounds: Normal breath sounds. No stridor. No wheezing or rales.  Lymphadenopathy:     Head:     Right side of head: No tonsillar adenopathy.     Left side of head: No tonsillar adenopathy.     Cervical: No cervical adenopathy.  Skin:    Findings: No erythema or rash.     Nails: There is no clubbing.  Neurological:     Mental Status: She is alert.     Diagnostics:    Spirometry was performed and demonstrated an FEV1 of 1.50 at 83 % of predicted.  Results of blood tests obtained 01 January 2023 identified IgG 799 Mg/DL, IgA 99 Mg/DL, IgM 61 Mg/DL,.  We increased her immunoglobulin fusion from 13  g to 15 g weekly.  Assessment and Plan:   1. Hypogammaglobulinemia (HCC)   2. Not well controlled severe persistent asthma   3. Bronchiectasis without complication (HCC)   4. LPRD (laryngopharyngeal reflux disease)   5. Immunosuppression due to chronic steroid use (HCC)   6. Sleep dysfunction with sleep stage disturbance   7. Thrush    1. Continue to Treat  sleep dysfunction with Primary doctor  2. Continue to use prednisone with Rheumatologist  3. Continue to Treat laryngopharyngeal reflux:    A. Dexilant 60 mg in the morning  B. famotidine 40 mg daily  4.  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  Continue immunoglobulin infusions   5. Continue to Treat inflammation of airway:   A. Trelegy 200 - I inhalation 1 time per day  6. Treat and prevent thrush:   A. Nystatin - 5 mls swish, gargle, swallow after Trelegy use  7.  If  needed:  A. Astelin and Zyrtec and nasal saline  B. Percussion vest   C. Albuterol + Fluticasone 44 - 2 inhalations TOGETHER every 4-6 hours D. Albuterol nebulization every 4-6 hours  8. Return to clinic in 8 weeks or earlier if problem  9. Repeat blood tests with next visit   Dynette will continue to use therapy directed against her hypogammaglobulinemia and against her LPR and against inflammation of her airway and now start therapy for thrush.  I will see her back in this clinic in 8 weeks and we will recheck her immunoglobulin levels with that visit.  I have asked her to follow-up with her primary care doctor regarding her sleep dysfunction and to continue prednisone as directed by her rheumatologist.  Laurette Schimke, MD Allergy / Immunology [facility]

## 2023-01-30 ENCOUNTER — Encounter: Payer: Self-pay | Admitting: Allergy and Immunology

## 2023-01-31 ENCOUNTER — Telehealth: Payer: Self-pay

## 2023-01-31 ENCOUNTER — Other Ambulatory Visit (HOSPITAL_COMMUNITY): Payer: Self-pay

## 2023-01-31 NOTE — Telephone Encounter (Signed)
Pharmacy Patient Advocate Encounter   Received notification from CoverMyMeds that prior authorization for Mupirocin 2% ointment is required/requested.   Insurance verification completed.   The patient is insured through [employer] .   Per test claim: PA required; PA submitted to above mentioned insurance via CoverMyMeds Key/confirmation #/EOC [facility] Status is pending

## 2023-01-31 NOTE — Telephone Encounter (Signed)
Copied from CRM [id number]. Topic: Clinical - Medication Question >> Jan 31, 2023  2:45 PM Colletta Maryland S wrote: Reason for CRM: Pt called in wanting to get a refill on traZODone 100 mg rx, pt stated she spoke with provider about upped dosage, not seeing rx in pts chart

## 2023-02-01 ENCOUNTER — Other Ambulatory Visit: Payer: Self-pay | Admitting: Family Medicine

## 2023-02-01 DIAGNOSIS — I130 Hypertensive heart and chronic kidney disease with heart failure and stage 1 through stage 4 chronic kidney disease, or unspecified chronic kidney disease: Secondary | ICD-10-CM | POA: Diagnosis not present

## 2023-02-01 DIAGNOSIS — D631 Anemia in chronic kidney disease: Secondary | ICD-10-CM | POA: Diagnosis not present

## 2023-02-01 DIAGNOSIS — M059 Rheumatoid arthritis with rheumatoid factor, unspecified: Secondary | ICD-10-CM | POA: Diagnosis not present

## 2023-02-01 DIAGNOSIS — E1122 Type 2 diabetes mellitus with diabetic chronic kidney disease: Secondary | ICD-10-CM | POA: Diagnosis not present

## 2023-02-01 DIAGNOSIS — J45901 Unspecified asthma with (acute) exacerbation: Secondary | ICD-10-CM | POA: Diagnosis not present

## 2023-02-01 DIAGNOSIS — N1832 Chronic kidney disease, stage 3b: Secondary | ICD-10-CM | POA: Diagnosis not present

## 2023-02-01 DIAGNOSIS — I5022 Chronic systolic (congestive) heart failure: Secondary | ICD-10-CM | POA: Diagnosis not present

## 2023-02-01 DIAGNOSIS — E785 Hyperlipidemia, unspecified: Secondary | ICD-10-CM | POA: Diagnosis not present

## 2023-02-01 DIAGNOSIS — A419 Sepsis, unspecified organism: Secondary | ICD-10-CM | POA: Diagnosis not present

## 2023-02-01 MED ORDER — TRAZODONE HCL 50 MG PO TABS: 100.0000 mg | ORAL_TABLET | Freq: Every day | ORAL | 1 refills | Status: DC

## 2023-02-01 NOTE — Telephone Encounter (Signed)
Pharmacy Patient Advocate Encounter  Received notification from [facility] that Prior Authorization for  Mupirocin 2% ointment has been DENIED.  Full denial letter will be uploaded to the media tab. See denial reason below.  Off-label use is medically accepted when there is proof in one or more of the drug guides that the drug works for the condition.  PA #/Case ID/Reference #: [id number]

## 2023-02-04 ENCOUNTER — Ambulatory Visit: Payer: Medicare PPO | Admitting: Adult Health

## 2023-02-04 VITALS — BP 124/70 | HR 72 | Temp 99.1°F | Ht 60.0 in | Wt 135.6 lb

## 2023-02-04 DIAGNOSIS — Z7901 Long term (current) use of anticoagulants: Secondary | ICD-10-CM | POA: Diagnosis not present

## 2023-02-04 DIAGNOSIS — J479 Bronchiectasis, uncomplicated: Secondary | ICD-10-CM

## 2023-02-04 DIAGNOSIS — Z86718 Personal history of other venous thrombosis and embolism: Secondary | ICD-10-CM | POA: Diagnosis not present

## 2023-02-04 DIAGNOSIS — J4551 Severe persistent asthma with (acute) exacerbation: Secondary | ICD-10-CM

## 2023-02-04 DIAGNOSIS — J45909 Unspecified asthma, uncomplicated: Secondary | ICD-10-CM

## 2023-02-04 DIAGNOSIS — J3089 Other allergic rhinitis: Secondary | ICD-10-CM
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
## 2023-02-04 NOTE — Patient Instructions (Addendum)
Continue on Trelegy 1 puff daily, rinse after use.  Albuterol inhaler /neb as needed  Increase VEST therapy Twice daily   Restart Flutter Twice daily   Activity as tolerated.  Follow up with Dr. Francine Graven in 4-6 months and As needed

## 2023-02-06 ENCOUNTER — Telehealth: Payer: Self-pay

## 2023-02-06 DIAGNOSIS — J45901 Unspecified asthma with (acute) exacerbation: Secondary | ICD-10-CM | POA: Diagnosis not present

## 2023-02-06 DIAGNOSIS — E1122 Type 2 diabetes mellitus with diabetic chronic kidney disease: Secondary | ICD-10-CM | POA: Diagnosis not present

## 2023-02-06 DIAGNOSIS — E785 Hyperlipidemia, unspecified: Secondary | ICD-10-CM | POA: Diagnosis not present

## 2023-02-06 DIAGNOSIS — A419 Sepsis, unspecified organism: Secondary | ICD-10-CM | POA: Diagnosis not present

## 2023-02-06 DIAGNOSIS — I5022 Chronic systolic (congestive) heart failure: Secondary | ICD-10-CM | POA: Diagnosis not present

## 2023-02-06 DIAGNOSIS — D631 Anemia in chronic kidney disease: Secondary | ICD-10-CM | POA: Diagnosis not present

## 2023-02-06 DIAGNOSIS — N1832 Chronic kidney disease, stage 3b: Secondary | ICD-10-CM | POA: Diagnosis not present

## 2023-02-06 DIAGNOSIS — I130 Hypertensive heart and chronic kidney disease with heart failure and stage 1 through stage 4 chronic kidney disease, or unspecified chronic kidney disease: Secondary | ICD-10-CM | POA: Diagnosis not present

## 2023-02-06 DIAGNOSIS — M059 Rheumatoid arthritis with rheumatoid factor, unspecified: Secondary | ICD-10-CM | POA: Diagnosis not present

## 2023-02-06 NOTE — Telephone Encounter (Signed)
Copied from CRM [id number]. Topic: Clinical - Medication Question >> Feb 06, 2023 11:28 AM Fuller Canada P wrote: Reason for CRM: Physical therapist Enrique Sack called in requesting patients current medication list, would like a call back at [phone]

## 2023-02-07 ENCOUNTER — Other Ambulatory Visit: Payer: Self-pay

## 2023-02-07 DIAGNOSIS — M1120 Other chondrocalcinosis, unspecified site: Secondary | ICD-10-CM

## 2023-02-07 DIAGNOSIS — I82499 Acute embolism and thrombosis of other specified deep vein of unspecified lower extremity: Secondary | ICD-10-CM

## 2023-02-07 DIAGNOSIS — I5020 Unspecified systolic (congestive) heart failure: Secondary | ICD-10-CM

## 2023-02-07 DIAGNOSIS — D539 Nutritional anemia, unspecified: Secondary | ICD-10-CM

## 2023-02-07 DIAGNOSIS — N1832 Chronic kidney disease, stage 3b: Secondary | ICD-10-CM

## 2023-02-07 DIAGNOSIS — Z86711 Personal history of pulmonary embolism: Secondary | ICD-10-CM

## 2023-02-07 DIAGNOSIS — J4550 Severe persistent asthma, uncomplicated: Secondary | ICD-10-CM

## 2023-02-07 DIAGNOSIS — E782 Mixed hyperlipidemia: Secondary | ICD-10-CM

## 2023-02-07 MED ORDER — EMPAGLIFLOZIN 10 MG PO TABS: 10.0000 mg | ORAL_TABLET | Freq: Every day | ORAL | 1 refills | Status: DC

## 2023-02-07 MED ORDER — RIVAROXABAN 20 MG PO TABS: 20.0000 mg | ORAL_TABLET | Freq: Every day | ORAL | 2 refills | Status: DC

## 2023-02-07 MED ORDER — ALLOPURINOL 100 MG PO TABS: 100.0000 mg | ORAL_TABLET | Freq: Every day | ORAL | 0 refills | Status: DC

## 2023-02-07 MED ORDER — FOLIC ACID 1 MG PO TABS: 1.0000 mg | ORAL_TABLET | Freq: Every day | ORAL | 3 refills | Status: DC

## 2023-02-07 MED ORDER — MONTELUKAST SODIUM 10 MG PO TABS: 10.0000 mg | ORAL_TABLET | Freq: Every day | ORAL | 1 refills | Status: DC

## 2023-02-07 MED ORDER — ATORVASTATIN CALCIUM 80 MG PO TABS: 80.0000 mg | ORAL_TABLET | Freq: Every evening | ORAL | 1 refills | Status: DC

## 2023-02-11 ENCOUNTER — Telehealth: Payer: Medicare PPO | Admitting: Family Medicine

## 2023-02-11 DIAGNOSIS — R509 Fever, unspecified: Secondary | ICD-10-CM | POA: Diagnosis not present

## 2023-02-11 MED ORDER — AZITHROMYCIN 250 MG PO TABS: ORAL_TABLET | ORAL | 0 refills | Status: DC

## 2023-02-11 NOTE — Progress Notes (Signed)
Subjective:    Patient ID: Ebony Scott, female    DOB: 09-08-1950, 72 y.o.   MRN: [id number]  HPI  Patient has been today video visit.  Video call began at 205.  Video visit ended at 215.  Patient consents to be seen via video.  She is currently at home.  I am currently in my office.  She reports a fever for 2 days.  She reports diffuse bodyaches.  She reports a worsening cough that is productive of yellow and occasionally bloody mucus.  She denies any sinus pain or rhinorrhea or sore throat or otalgia.  She has a history of recurrent pneumonias requiring hospitalization.   Past Medical History:  Diagnosis Date   Anemia    Angio-edema    Breast cancer (HCC) 1998   Left breast, in remission   Bronchiectasis (HCC)    CHF (congestive heart failure) (HCC)    Clostridium difficile colitis 01/14/2018   Coronary artery disease    Depression    "some; don't take anything for it" (02/20/2012), pt denies dx as of 06/02/21   Diverticulosis    DVT (deep venous thrombosis) (HCC)    Fibromyalgia 11/2011   GAVE (gastric antral vascular ectasia)    GERD (gastroesophageal reflux disease)    Headache(784.0)    "related to allergies; more at different times during the year" (02/20/2012)   Hemorrhoids    Hiatal hernia    back and neck   History of blood transfusion 2023   1 unit, 5 units of Iron   Hx of adenomatous colonic polyps 04/12/2016   Hypercholesteremia    good cholesterol is high   Hypothyroidism    h/o Graves disease   IBS (irritable bowel syndrome)    Moderate persistent asthma    -FeV1 72% 2011, -IgE 102 2011, CT sinus Neg 2011   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 Casimer Lanius   Sleep apnea    uses cpap nightly   Tracheobronchomalacia    Past Surgical History:  Procedure Laterality Date   ABDOMINAL  HYSTERECTOMY N/A    Phreesia 10/21/2019   ANTERIOR AND POSTERIOR REPAIR  1990's   APPENDECTOMY     ARGON LASER APPLICATION  02/05/2021   Procedure: ARGON LASER APPLICATION;  Surgeon: Benancio Deeds, MD;  Location: [facility];  Service: Gastroenterology;;   BREAST LUMPECTOMY  1998   left   BREAST SURGERY N/A    Phreesia 10/21/2019   BRONCHIAL WASHINGS  04/05/2020   Procedure: BRONCHIAL WASHINGS;  Surgeon: Martina Sinner, MD;  Location: [facility];  Service: Pulmonary;;   CAPSULOTOMY Right 08/28/2021   Procedure: CAPSULOTOMY;  Surgeon: Candelaria Stagers, DPM;  Location: [facility];  Service: Podiatry;  Laterality: Right;   CARPOMETACARPEL (CMC) FUSION OF THUMB WITH AUTOGRAFT FROM RADIUS  ~ 2009   "both thumbs" (02/20/2012)   CATARACT EXTRACTION W/ INTRAOCULAR LENS  IMPLANT, BILATERAL  2012   CERVICAL DISCECTOMY  10/2001   C5-C6   CERVICAL FUSION  2003   C3-C4   CHOLECYSTECTOMY     COLONOSCOPY     CORONARY STENT INTERVENTION N/A 01/11/2021   Procedure: CORONARY STENT INTERVENTION;  Surgeon: Marykay Lex, MD;  Location: [facility] INVASIVE CV LAB;  Service: Cardiovascular;  Laterality: N/A;   DEBRIDEMENT TENNIS ELBOW  ?1970's  right   ESOPHAGOGASTRODUODENOSCOPY     ESOPHAGOGASTRODUODENOSCOPY (EGD) WITH PROPOFOL N/A 02/05/2021   Procedure: ESOPHAGOGASTRODUODENOSCOPY (EGD) WITH PROPOFOL;  Surgeon: Benancio Deeds, MD;  Location: [facility];  Service: Gastroenterology;  Laterality: N/A;   EYE SURGERY N/A    Phreesia 10/21/2019   HAMMER TOE SURGERY Right 08/28/2021   Procedure: HAMMER TOE REPAIR 2ND TOE RIGHT FOOT;  Surgeon: Candelaria Stagers, DPM;  Location: [facility];  Service: Podiatry;  Laterality: Right;   HYSTERECTOMY     I & D EXTREMITY Right 09/22/2020   Procedure: IRRIGATION AND DEBRIDEMENT RIGHT HAND AND ELBOW;  Surgeon: Teryl Lucy, MD;  Location: [facility];  Service: Orthopedics;  Laterality: Right;   ICD IMPLANT N/A 08/27/2022   Procedure: ICD IMPLANT;  Surgeon: Maurice Small, MD;  [facility]: [facility];  Service: Cardiovascular;  Laterality: N/A;   KNEE ARTHROPLASTY  ?1990's   "?right; w/cartilage repair" (02/20/2012)   MASS EXCISION Left 08/28/2021   Procedure: EXCISION OF SOFT TISSUE  MASS LEFT FOOT;  Surgeon: Candelaria Stagers, DPM;  Location: [facility];  Service: Podiatry;  Laterality: Left;   NASAL SEPTUM SURGERY  1980's   POSTERIOR CERVICAL FUSION/FORAMINOTOMY  2004   "failed initial fusion; rewired  anterior neck" (02/20/2012)   REVERSE SHOULDER ARTHROPLASTY Right 02/16/2020   Procedure: REVERSE SHOULDER ARTHROPLASTY;  Surgeon: Teryl Lucy, MD;  Location: [facility];  Service: Orthopedics;  Laterality: Right;   RIGHT/LEFT HEART CATH AND CORONARY ANGIOGRAPHY N/A 01/11/2021   Procedure: RIGHT/LEFT HEART CATH AND CORONARY ANGIOGRAPHY;  Surgeon: Marykay Lex, MD;  Location: [facility]VASIVE CV LAB;  Service: Cardiovascular;  Laterality: N/A;   SPINE SURGERY N/A    Phreesia 10/21/2019   TONSILLECTOMY  ~ 1953   VESICOVAGINAL FISTULA CLOSURE W/ TAH  1988   VIDEO BRONCHOSCOPY Bilateral 08/23/2016   Procedure: VIDEO BRONCHOSCOPY WITH FLUORO;  Surgeon: Roslynn Amble, MD;  Location: [facility];  Service: Cardiopulmonary;  Laterality: Bilateral;   VIDEO BRONCHOSCOPY N/A 04/05/2020   Procedure: VIDEO BRONCHOSCOPY WITHOUT FLUORO;  Surgeon: Martina Sinner, MD;  Location: [facility];  Service: Pulmonary;  Laterality: N/A;   Current Outpatient Medications on File Prior to Visit  Medication Sig Dispense Refill   acetaminophen (TYLENOL) 500 MG tablet Take 1,000 mg by mouth as needed for moderate pain.     albuterol (PROVENTIL) (2.5 MG/3ML) 0.083% nebulizer solution Take 3 mLs (2.5 mg total) by nebulization every 4 (four) hours as needed for wheezing or shortness of breath. 150 mL 1   albuterol (VENTOLIN HFA) 108 (90 Base) MCG/ACT inhaler Inhale 2 puffs into the lungs every 6 (six) hours as needed for wheezing or shortness of breath. 18 g 1   allopurinol (ZYLOPRIM) 100  MG tablet Take 1 tablet (100 mg total) by mouth daily. 90 tablet 0   Alpha-D-Galactosidase (BEANO PO) Take 2-3 tablets by mouth 3 (three) times daily as needed (Gas).     Alpha-Lipoic Acid 600 MG TABS Take 600 mg by mouth in the morning.     atorvastatin (LIPITOR) 80 MG tablet Take 1 tablet (80 mg total) by mouth every evening. 90 tablet 1   azelastine (ASTELIN) 0.1 % nasal spray Place 2 sprays into both nostrils daily. Use in each nostril as directed (Patient taking differently: Place 2 sprays into both nostrils at bedtime as needed for allergies or rhinitis. Use in each nostril as directed) 30 mL 5   cetirizine (ZYRTEC) 10 MG tablet Take 1 tablet (10 mg total) by mouth  2 (two) times daily as needed for allergies (Can take a n extra dose during flare ups.). (Patient taking differently: Take 10 mg by mouth at bedtime.) 60 tablet 5   Cholecalciferol (VITAMIN D3) 25 MCG (1000 UT) capsule Take 1,000 Units by mouth at bedtime.     clopidogrel (PLAVIX) 75 MG tablet Take 1 tablet (75 mg total) by mouth daily. (Patient taking differently: Take 75 mg by mouth in the morning.) 90 tablet 3   cyproheptadine (PERIACTIN) 4 MG tablet Take 3 tablets (12 mg total) by mouth at bedtime. 90 tablet 1   denosumab (PROLIA) 60 MG/ML SOSY injection Inject 60 mg into the skin every 6 (six) months.     dexlansoprazole (DEXILANT) 60 MG capsule Take 1 capsule (60 mg total) by mouth in the morning. 90 capsule 1   diclofenac Sodium (VOLTAREN) 1 % GEL Apply 1 Application topically as needed (pain).     DULoxetine (CYMBALTA) 20 MG capsule Take 1 capsule (20 mg total) by mouth daily.     empagliflozin (JARDIANCE) 10 MG TABS tablet Take 1 tablet (10 mg total) by mouth daily. 90 tablet 1   EPINEPHRINE 0.3 mg/0.3 mL IJ SOAJ injection USE AS DIRECTED BY YOUR PHYSICIAN INTRAMUSCULARLY AS NEEDED FOR ANAPHYLAXIS 2 each 1   famotidine (PEPCID) 20 MG tablet Take 1 tablet (20 mg total) by mouth every morning. TAKE 1 TABLET BY MOUTH EACH NIGHT  AT BEDTIME Strength: 20 mg 90 tablet 1   Fluticasone-Umeclidin-Vilant (TRELEGY ELLIPTA) 200-62.5-25 MCG/ACT AEPB Inhale 1 Inhalation into the lungs in the morning. 84 each 1   folic acid (FOLVITE) 1 MG tablet Take 1 tablet (1 mg total) by mouth daily. 90 tablet 3   furosemide (LASIX) 40 MG tablet Take 40 mg by mouth daily.     gabapentin (NEURONTIN) 300 MG capsule Take 1 capsule (300 mg total) by mouth at bedtime.     guaiFENesin (MUCINEX) 600 MG 12 hr tablet Take 600 mg by mouth 2 (two) times daily.     HIZENTRA 10 GM/50ML SOLN Inject 15 g into the skin once a week. 13 g Infusion 300 mL 11   ipratropium-albuterol (DUONEB) 0.5-2.5 (3) MG/3ML SOLN Take 3 mLs by nebulization every 6 (six) hours as needed. 360 mL 6   isosorbide-hydrALAZINE (BIDIL) 20-37.5 MG tablet Take 1 tablet by mouth 2 (two) times daily. 180 tablet 3   Lactase (LACTOSE FAST ACTING RELIEF PO) Take 2-3 tablets by mouth 3 (three) times daily before meals. For dairy food or drink     magnesium oxide (MAG-OX) 400 (240 Mg) MG tablet Take 400 mg by mouth at bedtime.     montelukast (SINGULAIR) 10 MG tablet Take 1 tablet (10 mg total) by mouth daily after supper. 90 tablet 1   mupirocin ointment (BACTROBAN) 2 % Apply 1 Application topically as needed (wounds). 30 g 3   nystatin (MYCOSTATIN) 100000 UNIT/ML suspension 5 mls swish, gargle, swallow after Trelegy use 60 mL 3   potassium chloride (KLOR-CON) 10 MEQ tablet Take 10 mEq by mouth daily.     predniSONE (DELTASONE) 1 MG tablet TAKE 2 TABLETS (2MG ) BY MOUTH ONCE DAILY WITH BREAKFAST. TAKE ALONG WITH 5MG  DAILY 180 tablet 0   predniSONE (DELTASONE) 5 MG tablet TAKE 1 TABLET (5MG ) BY MOUTH ONCE DAILY WITH BREAKFAST 90 tablet 0   Prenatal Vit-Fe Fumarate-FA (PRENATAL VITAMINS PO) Take 1 tablet by mouth in the morning.     Probiotic Product (ALIGN) 4 MG CAPS Take 4 mg by mouth  every evening.     Respiratory Therapy Supplies (NEBULIZER MASK ADULT) MISC 1 Device by Does not apply route as  directed. 1 each 1   rivaroxaban (XARELTO) 20 MG TABS tablet Take 1 tablet (20 mg total) by mouth daily with supper. 90 tablet 2   sacubitril-valsartan (ENTRESTO) 24-26 MG Take 1 tablet by mouth 2 (two) times daily. 180 tablet 1   traMADol (ULTRAM) 50 MG tablet Take 1 tablet (50 mg total) by mouth at bedtime. 30 tablet 0   traZODone (DESYREL) 50 MG tablet Take 2 tablets (100 mg total) by mouth at bedtime. 60 tablet 1   Current Facility-Administered Medications on File Prior to Visit  Medication Dose Route Frequency Provider Last Rate Last Admin   iohexol (OMNIPAQUE) 350 MG/ML injection    PRN Marykay Lex, MD   105 mL at 01/11/21 0955   Allergies  Allergen Reactions   Baclofen Anaphylaxis and Other (See Comments)    Altered mental status requiring a 3-day hospital stay- patient became unresponsive   "Put me in a coma for five days", Altered mental status requiring a 3-day hospital stay- patient became unresponsive   Other Shortness Of Breath and Other (See Comments)    Grass and weeds "sneezing; filled sinuses" (02/20/2012)   Pork-Derived Products Anaphylaxis   Shellfish Allergy Anaphylaxis, Shortness Of Breath, Itching and Rash   Shrimp Extract Anaphylaxis and Other (See Comments)    ALL SHELLFISH   Tetracycline Hcl Nausea And Vomiting   Xolair [Omalizumab] Other (See Comments)    Caused Blood clot   Zoledronic Acid Other (See Comments)    Reclast- Fever, Put in hospital, dr said it was a reaction from a reaction    Fever, inability to walk, Put in hospital  Fever, Put in hospital, dr said it was a reaction from a reaction , Other reaction(s): Unknown   Celecoxib Swelling    Feet swelling   Dilaudid [Hydromorphone Hcl] Itching   Hydrocodone-Acetaminophen Itching   Levofloxacin Other (See Comments)    GI upset   Molds & Smuts Cough   Morphine And Codeine Hives and Itching   Oxycodone Hcl Nausea And Vomiting    GI Intolerance   Paroxetine Nausea And Vomiting and Other  (See Comments)    GI Intolerance also   Penicillins Rash and Other (See Comments)    "welts" (02/20/2012)  Tolerated Ancef 02/16/20   Tetracycline Hcl Other (See Comments)    GI Intolerance   Oxycodone-Acetaminophen Itching   Diltiazem Hcl Swelling   Dust Mite Extract Other (See Comments) and Cough    "sneezing" (02/20/2012) too   Gamunex [Immune Globulin] Itching and Rash    Tolerates hizentra    Lactose Other (See Comments)    Bloating and gas   Penicillin G Procaine Rash and Other (See Comments)    Welts, also   Rituximab Other (See Comments)    Anemia    Rofecoxib Swelling    Vioxx- feet swelling   Tree Extract Itching    "tested and told I was allergic to it; never experienced a reaction to it" (02/20/2012)   Social History   Socioeconomic History   Marital status: Divorced    Spouse name: Not on file   Number of children: 2   Years of education: college   Highest education level: Not on file  Occupational History   Occupation: Disabled    Comment: Retired Audiological scientist    Employer: RETIRED  Tobacco Use   Smoking status:  Never    Passive exposure: Past   Smokeless tobacco: Never   Tobacco comments:    Parents  Vaping Use   Vaping status: Never Used  Substance and Sexual Activity   Alcohol use: Not Currently    Alcohol/week: 0.0 standard drinks of alcohol   Drug use: No   Sexual activity: Not Currently    Birth control/protection: Surgical    Comment: Hysterectomy  Other Topics Concern   Not on file  Social History Narrative   Patient lives at home alone. Patient  divorced.    Patient has her BS degree.   Right handed.   Caffeine- sometimes coffee.      [facility]:   Born in [place], [place]. She worked as a Armed forces operational officer. She has no pets currently. She does have indoor plants. Previously had mold in her home that was remediated. Carpet was removed.          Social Drivers of Corporate investment banker Strain: Low Risk   (11/22/2022)   Overall Financial Resource Strain (CARDIA)    Difficulty of Paying Living Expenses: Not hard at all  Food Insecurity: No Food Insecurity (11/30/2022)   Hunger Vital Sign    Worried About Running Out of Food in the Last Year: Never true    Ran Out of Food in the Last Year: Never true  Transportation Needs: No Transportation Needs (11/30/2022)   PRAPARE - Administrator, Civil Service (Medical): No    Lack of Transportation (Non-Medical): No  Physical Activity: Insufficiently Active (11/22/2022)   Exercise Vital Sign    Days of Exercise per Week: 3 days    Minutes of Exercise per Session: 30 min  Stress: No Stress Concern Present (11/22/2022)   [employer] of Occupational Health - Occupational Stress Questionnaire    Feeling of Stress : Not at all  Social Connections: Moderately Isolated (11/22/2022)   Social Connection and Isolation Panel [NHANES]    Frequency of Communication with Friends and Family: More than three times a week    Frequency of Social Gatherings with Friends and Family: Three times a week    Attends Religious Services: 1 to 4 times per year    Active Member of Clubs or Organizations: No    Attends Banker Meetings: Never    Marital Status: Divorced  Catering manager Violence: Not At Risk (11/30/2022)   Humiliation, Afraid, Rape, and Kick questionnaire    Fear of Current or Ex-Partner: No    Emotionally Abused: No    Physically Abused: No    Sexually Abused: No    Review of Systems  All other systems reviewed and are negative.      Objective:   Physical Exam        Assessment & Plan:  Fever, unspecified fever cause Difficult to determine without examining the patient.  However she has a history of recurrent hospital admissions due to delirium from pneumonia.  I will start the patient on a Z-Pak as she is allergic to fluoroquinolones, penicillin, and tetracycline.  Recheck in 48 hours if no better or sooner if  worsening.

## 2023-02-14 DIAGNOSIS — G4736 Sleep related hypoventilation in conditions classified elsewhere: Secondary | ICD-10-CM | POA: Diagnosis not present

## 2023-02-14 DIAGNOSIS — I5032 Chronic diastolic (congestive) heart failure: Secondary | ICD-10-CM | POA: Diagnosis not present

## 2023-02-14 DIAGNOSIS — J45909 Unspecified asthma, uncomplicated: Secondary | ICD-10-CM | POA: Diagnosis not present

## 2023-02-14 DIAGNOSIS — J471 Bronchiectasis with (acute) exacerbation: Secondary | ICD-10-CM | POA: Diagnosis not present

## 2023-02-14 DIAGNOSIS — M0600 Rheumatoid arthritis without rheumatoid factor, unspecified site: Secondary | ICD-10-CM | POA: Diagnosis not present

## 2023-02-14 DIAGNOSIS — G894 Chronic pain syndrome: Secondary | ICD-10-CM | POA: Diagnosis not present

## 2023-02-14 DIAGNOSIS — G4733 Obstructive sleep apnea (adult) (pediatric): Secondary | ICD-10-CM | POA: Diagnosis not present

## 2023-02-14 DIAGNOSIS — J4540 Moderate persistent asthma, uncomplicated: Secondary | ICD-10-CM | POA: Diagnosis not present

## 2023-02-14 DIAGNOSIS — R0602 Shortness of breath: Secondary | ICD-10-CM | POA: Diagnosis not present

## 2023-02-15 ENCOUNTER — Ambulatory Visit: Payer: Medicare PPO | Admitting: Family Medicine

## 2023-02-18 ENCOUNTER — Telehealth: Payer: Self-pay

## 2023-02-18 ENCOUNTER — Other Ambulatory Visit: Payer: Self-pay | Admitting: Family Medicine

## 2023-02-18 DIAGNOSIS — M1120 Other chondrocalcinosis, unspecified site: Secondary | ICD-10-CM

## 2023-02-18 DIAGNOSIS — N1832 Chronic kidney disease, stage 3b: Secondary | ICD-10-CM | POA: Diagnosis not present

## 2023-02-18 DIAGNOSIS — E1122 Type 2 diabetes mellitus with diabetic chronic kidney disease: Secondary | ICD-10-CM | POA: Diagnosis not present

## 2023-02-18 DIAGNOSIS — A419 Sepsis, unspecified organism: Secondary | ICD-10-CM | POA: Diagnosis not present

## 2023-02-18 DIAGNOSIS — M19011 Primary osteoarthritis, right shoulder: Secondary | ICD-10-CM

## 2023-02-18 DIAGNOSIS — I130 Hypertensive heart and chronic kidney disease with heart failure and stage 1 through stage 4 chronic kidney disease, or unspecified chronic kidney disease: Secondary | ICD-10-CM | POA: Diagnosis not present

## 2023-02-18 DIAGNOSIS — D631 Anemia in chronic kidney disease: Secondary | ICD-10-CM | POA: Diagnosis not present

## 2023-02-18 DIAGNOSIS — M059 Rheumatoid arthritis with rheumatoid factor, unspecified: Secondary | ICD-10-CM | POA: Diagnosis not present

## 2023-02-18 DIAGNOSIS — I5022 Chronic systolic (congestive) heart failure: Secondary | ICD-10-CM | POA: Diagnosis not present

## 2023-02-18 DIAGNOSIS — E785 Hyperlipidemia, unspecified: Secondary | ICD-10-CM | POA: Diagnosis not present

## 2023-02-18 DIAGNOSIS — J45901 Unspecified asthma with (acute) exacerbation: Secondary | ICD-10-CM | POA: Diagnosis not present

## 2023-02-18 MED ORDER — TRAMADOL HCL 50 MG PO TABS: 50.0000 mg | ORAL_TABLET | Freq: Every day | ORAL | 0 refills | Status: DC

## 2023-02-18 NOTE — Telephone Encounter (Signed)
Her current dose is 5 mls swish, gargle and swallow after Trelegy use but it looks like it was prescribed by Dr. Lucie Leather. Thanks.   Copied from CRM [id number]. Topic: Clinical - Prescription Issue >> Feb 18, 2023  2:06 PM Elle L wrote: Reason for CRM: The patient is requesting that her nystatin (MYCOSTATIN) 100000 UNIT/ML suspension be called in for a quart or pint as the current prescription will not last for the month.

## 2023-02-18 NOTE — Telephone Encounter (Signed)
Copied from CRM [id number]. Topic: Clinical - Medication Refill >> Feb 18, 2023  9:08 AM Fuller Canada P wrote: Most Recent Primary Care Visit:  Provider: Lynnea Ferrier T  Department: [facility]  Visit Type: ACUTE  Date: 02/15/2023  Medication: traMADol (ULTRAM) 50 MG tablet   Has the patient contacted their pharmacy? Yes (Agent: If no, request that the patient contact the pharmacy for the refill. If patient does not wish to contact the pharmacy document the reason why and proceed with request.) (Agent: If yes, when and what did the pharmacy advise?)  Is this the correct pharmacy for this prescription? Yes If no, delete pharmacy and type the correct one.  This is the patient's preferred pharmacy:  [facility] [address], [place] - [address] [place] [address] Phone: [phone] Fax: [phone]   Has the prescription been filled recently? No  Is the patient out of the medication? Yes  Has the patient been seen for an appointment in the last year OR does the patient have an upcoming appointment? Yes  Can we respond through MyChart? Yes  Agent: Please be advised that Rx refills may take up to 3 business days. We ask that you follow-up with your pharmacy.

## 2023-02-19 ENCOUNTER — Telehealth: Payer: Self-pay

## 2023-02-19 ENCOUNTER — Other Ambulatory Visit: Payer: Self-pay | Admitting: Family Medicine

## 2023-02-19 DIAGNOSIS — M19011 Primary osteoarthritis, right shoulder: Secondary | ICD-10-CM

## 2023-02-19 DIAGNOSIS — M1120 Other chondrocalcinosis, unspecified site: Secondary | ICD-10-CM

## 2023-02-19 MED ORDER — TRAMADOL HCL 50 MG PO TABS: 50.0000 mg | ORAL_TABLET | Freq: Every day | ORAL | 0 refills | Status: DC

## 2023-02-19 NOTE — Telephone Encounter (Signed)
 Copied from CRM #[id number]. Topic: Clinical - Prescription Issue >> Feb 19, 2023 12:51 PM Bascom RAMAN wrote: Reason for CRM: The prescription was called in to wrong pharmacy. Patient needs traMADol  (ULTRAM ) 50 MG tablet [facility] to [facility] [address] [place], [place] [address]. Patient's callback number is [phone]

## 2023-02-22 ENCOUNTER — Telehealth: Payer: Self-pay

## 2023-02-22 NOTE — Telephone Encounter (Signed)
 Nursing Orders requiring MD signature fax received - documents placed on provider's desk to review/complete/sign on Tuesday, 02/26/23.  Forwarding message to provider as update.

## 2023-02-25 ENCOUNTER — Encounter: Payer: Medicare PPO | Admitting: Cardiovascular Disease

## 2023-02-25 DIAGNOSIS — J4551 Severe persistent asthma with (acute) exacerbation: Secondary | ICD-10-CM | POA: Diagnosis not present

## 2023-02-26 ENCOUNTER — Ambulatory Visit (INDEPENDENT_AMBULATORY_CARE_PROVIDER_SITE_OTHER): Payer: Medicare PPO

## 2023-02-26 DIAGNOSIS — I429 Cardiomyopathy, unspecified: Secondary | ICD-10-CM | POA: Diagnosis not present

## 2023-02-26 DIAGNOSIS — I5020 Unspecified systolic (congestive) heart failure: Secondary | ICD-10-CM

## 2023-02-26 NOTE — Telephone Encounter (Signed)
 Completed forms have been signed and faxed back to [facility] Attn: Hollice Gong @ [phone].

## 2023-02-27 ENCOUNTER — Telehealth: Payer: Self-pay | Admitting: Cardiovascular Disease

## 2023-02-27 LAB — CUP PACEART REMOTE DEVICE CHECK
Battery Remaining Longevity: 110 mo
Battery Remaining Percentage: 94 %
Battery Voltage: 3.04 V
Brady Statistic RV Percent Paced: 1 %
Date Time Interrogation Session: 20250107012241
HighPow Impedance: 82 Ohm
Implantable Lead Connection Status: 753985
Implantable Lead Implant Date: 20240708
Implantable Lead Location: 753860
Implantable Pulse Generator Implant Date: 20240708
Lead Channel Impedance Value: 530 Ohm
Lead Channel Pacing Threshold Amplitude: 0.5 V
Lead Channel Pacing Threshold Pulse Width: 0.5 ms
Lead Channel Sensing Intrinsic Amplitude: 12 mV
Lead Channel Setting Pacing Amplitude: 3.5 V
Lead Channel Setting Pacing Pulse Width: 0.5 ms
Lead Channel Setting Sensing Sensitivity: 0.5 mV
Pulse Gen Serial Number: 211016307
Zone Setting Status: 755011

## 2023-02-27 NOTE — Telephone Encounter (Signed)
 Patient calling in to see if a grounding mat will be okay to use. Please advise

## 2023-02-27 NOTE — Progress Notes (Deleted)
Office Visit Note  Patient: Ebony Scott             Date of Birth: 24-Feb-1950           MRN: [id number]             PCP: [name], MD Referring: [name], MD Visit Date: 03/13/2023   Subjective:  No chief complaint on file.   History of Present Illness: Ebony Scott is a 73 y.o. 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    Previous HPI 12/11/2022 Ebony Scott is a 73 y.o. 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  Unfortunate since the last visit she had pneumonia twice with exacerbation of her chronic bronchitis and is currently at facility for rehab after hospitalization.  Steroid injection to the finger at her last visit did not help much with regards to pain swelling or joint mobility.  She still noticing some redness and sometimes warmth to the joints near the tips of her fingers.  Worst problem currently is left shoulder pain.  She has difficulty reaching overhead also has pain worse at nighttime sometimes disrupting sleep.  Does not have symptoms radiating down the arm.   Previous HPI 09/10/2022 Ebony Scott is a 73 y.o. 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  She had recent hospitalization for fevers and hypotension without clear infectious source identified but concern of HCAP as this followed shortly after ICD placement on 7/8. She was treated with rocephin and azithromycin and transitioned to cefadroxil and azithromycin at discharge. She completed antibiotics but still has some diarrhea with frequent, watery stools though without any blood or mucus contents. She thinks the left shoulder steroid injection in April helped, but is currently limited unable to move her shoulder much after the recent ICD placement. Worst joint at present is right 5th PIP joint is very painful with movement and sometimes at rest.   Previous HPI 05/22/2022 Ebony HEIDEL is a 73 y.o. 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  Unfortunately did not see any significant benefit to joint inflammation with additional medication added.  She did get sick with community-acquired pneumonia resulting in hospitalization on March 6.  Respiratory status improved with treatment but unfortunately had a prolonged course 10 days until discharge.  Then had to go to short-term at rehab facility before returning home last weekend.  She was visited by home health physical therapy once on Easter Sunday but has not had any additional follow-up or treatment.  She feels overall weak and mobility is much worse compared to prior to hospitalization.  Also developed new erythematous skin rash involving abdomen and neck.   Previous HPI 03/07/22 PAISELY Scott is a 73 y.o. 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 She has increased joint pain in multiple areas especially bilateral hands also left shoulder and right knee. She has seen only a small benefit in symptoms so far after the knee viscosupplementation shots last year. She has pain pretty much all the time both morning and night time. She notices some worsening of lateral deviation in the small finger on her right hand.    Previous HPI 11/29/21 Ebony Scott is a 73 y.o. female here for follow up for seronegative RA and  cushing's syndrome currently on long term prednisone. She is on hizentra for hypogammaglobulinemia. Since last visit she had a bone marrow biopsy for macrocytosis with benign findings. She is having an exacerbation of asthma with wheezing and coughing increased since early September and did not improve well with a prolonged steroid taper. Subsequently has not been prescribed azithromycin and higher dose steroid starting about 5 days ago so far still having a lot of coughing and symptoms. She notices increased joint pain in hands and knees during this  episode. She completed a series of 3 viscosupplementation injections in the left knee about 2 weeks ago with a partial benefit.   Previous HPI 08/09/2021 Ebony Scott is a 73 y.o. female here for follow up or seronegative RA and cushing's syndrome on prednisone with gradual tapering but increased back to 7 mg daily after last visit.  She is having some increased joint pain at multiple areas but worst problem is increased pain in her left knee.  This is severe enough to limit her tolerance for weightbearing and mobility.  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  She has not had any severe interval infections.  She is scheduled for hammertoe corrective surgery next month.   Previous HPI 04/28/21 Ebony Scott is a 73 y.o. female here for follow up for seronegative RA and cushing's syndrome on prednisone with gradual tapering from 8 mg in December. She developed worsening iron deficiency anemia after GI bleed getting venofer replacement. Joint and skin problems were remaining pretty stable until about 2 weeks ot a month ago. This was after decreasing prednisone to 6 mg daily dose. Knee pain limits her mobility but hands have the most increase in swelling. Skin rash on the back of her neck and throughout her low back. She tried topical hydrocortisone without a large improvement so far.   Previous HPI 01/23/21 WESLYN Scott is a 73 y.o. female here for follow up for seronegative RA minimally responsive to multiple DMARD treatments on prednisone 10 mg PO daily with acquired cushing's syndrome. She has noticed some increase in right hand MCP joint swelling but overall joints not dramatically changed with dose reduction. Since our visit she went for coronary artery stent placement after study for evaluating cardiomyopathy found significant stenosis. No anginal symptoms, she is not sure if any different but husband reports noticing some improvement in her dyspnea on  exertion. She has had numerous other issues increased upper airway congestion and drainage. Sometimes productive coughing with this. She has had mild nasal bleeding with rhinitis and and sneezing issues.   Previous HPI 11/28/20 HAZELENE DOTEN is a 73 y.o. female here for iatrogenic cushing syndrome and history of rheumatoid arthritis with long term prednisone treatment, previously seen by Dr. Deanne Coffer at [facility] for this problem. Mostly had been taking treatments after hospitalization with severe joint inflammation in 2020. She has tried taking numerous DMARD treatments for years including actemra, simponi, abatacept, and rituximab so far without great response or tolerance. Chronic prednisone required for symptoms throughout this time but has taken these longstanding for asthma disease already. She has had some issue with recurrent infections with pulmonary and septic bursitis and more recently noninfectious allergy symptoms managed with Dr. Lucie Leather and Francine Graven. She started on IVIG treatments with Dr. Lucie Leather for this identified recently, not sure if primary or related to previous medication   DMARD Hx Rituximab Abatacept Simponi Actemra IV   No Rheumatology ROS completed.   PMFS History:  Patient Active Problem List   Diagnosis Date Noted   CAP (community acquired pneumonia) 11/30/2022   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   Sepsis (HCC) 11/30/2022   Fever 09/01/2022   Lethargy 08/31/2022   Hypotension 08/31/2022   DNR (do not resuscitate) 08/31/2022   Abnormal CT of the abdomen 04/26/2022   Itching of ear 04/10/2022   Effusion, left knee 08/09/2021   Noninfectious otitis externa of left ear 06/08/2021   Rash and other nonspecific skin eruption 04/28/2021   Iron deficiency anemia 02/24/2021   GAVE (gastric antral vascular ectasia)    Symptomatic anemia 02/04/2021   Anticoagulated    Antiplatelet or antithrombotic long-term use    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    Coronary artery calcification seen on CAT scan 01/11/2021   Acute kidney injury superimposed on chronic kidney disease (HCC) 10/04/2020   Physical deconditioning 09/25/2020   History of DVT (deep vein thrombosis) 09/24/2020   DMII (diabetes mellitus, type 2) (HCC) 09/23/2020   Cellulitis of right hand 09/22/2020   Abscess of dorsum of right hand 09/22/2020   Hoarseness 09/09/2020   Metabolic encephalopathy 02/24/2020   Acute encephalopathy    AMS (altered mental status) 02/23/2020   S/P reverse total shoulder arthroplasty, right    Hypokalemia    DOE (dyspnea on exertion) 02/18/2020   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   S/P reverse total shoulder arthroplasty, left 02/16/2020   Preoperative clearance 01/26/2020   Closed displaced fracture of metatarsal bone of right foot 01/20/2019   History of pulmonary embolus (PE) 12/19/2018   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   Rheumatoid arthritis (HCC)    Bilateral impacted cerumen 08/18/2018   Fungal otitis externa 08/18/2018   Cerumen debris on tympanic membrane of right ear 08/18/2018   Asthmatic bronchitis with exacerbation 07/29/2018   Pulmonary embolism (HCC) 07/29/2018   Cellulitis of forearm    Fever of unknown origin (FUO) 02/10/2018   Diarrhea 02/10/2018   Abdominal pain 02/10/2018   Fracture of base of fifth metatarsal bone with routine healing, left    Closed fracture of fifth metatarsal bone of right foot, initial encounter 01/15/2018   DVT (deep venous thrombosis) (HCC) 01/14/2018   Hypothyroidism 01/14/2018   Depression 01/14/2018   Debility    Protein-calorie malnutrition, moderate (HCC) 12/17/2017   Knee pain    History of breast cancer    Chronic pain syndrome    Fibromyalgia    Graves disease    Vasculitis (HCC) 12/13/2017   Rhabdomyolysis 12/13/2017   Dysphonia 08/10/2017   Pulmonary nodules    Chronic kidney disease, stage 3b (HCC) 04/15/2016   Hx of adenomatous colonic polyps 04/12/2016   Cough  02/09/2016   Opacity of lung on imaging study 11/03/2015   Severe persistent asthma 02/08/2015   Facial pain syndrome 02/08/2015   Insomnia 02/08/2015   Other allergic rhinitis 02/08/2015   LPRD (laryngopharyngeal reflux disease) 02/08/2015   Chronic systolic CHF (congestive heart failure) (HCC) 02/08/2015   Diastolic dysfunction 02/08/2015   Benign paroxysmal positional vertigo 12/03/2012   Disequilibrium 10/29/2012   Pseudogout 02/24/2012   Irritable bowel syndrome 01/02/2010   Hyperlipidemia 12/23/2009   Hyperthyroidism 11/12/2006   Seasonal and perennial allergic rhinitis 11/12/2006   GERD 11/12/2006   NECK PAIN, CHRONIC 11/12/2006   OSTEOPOROSIS 11/12/2006   BREAST CANCER, HX OF 11/12/2006    Past Medical History:  Diagnosis Date   Anemia    Angio-edema    Breast cancer (HCC) 1998   Left breast, in remission   Bronchiectasis (  HCC)    CHF (congestive heart failure) (HCC)    Clostridium difficile colitis 01/14/2018   Coronary artery disease    Depression    "some; don't take anything for it" (02/20/2012), pt denies dx as of 06/02/21   Diverticulosis    DVT (deep venous thrombosis) (HCC)    Fibromyalgia 11/2011   GAVE (gastric antral vascular ectasia)    GERD (gastroesophageal reflux disease)    Headache(784.0)    "related to allergies; more at different times during the year" (02/20/2012)   Hemorrhoids    Hiatal hernia    back and neck   History of blood transfusion 2023   1 unit, 5 units of Iron   Hx of adenomatous colonic polyps 04/12/2016   Hypercholesteremia    good cholesterol is high   Hypothyroidism    h/o Graves disease   IBS (irritable bowel syndrome)    Moderate persistent asthma    -FeV1 72% 2011, -IgE 102 2011, CT sinus Neg 2011   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 Casimer Lanius   Sleep apnea    uses cpap nightly   Tracheobronchomalacia     Family History  Problem Relation Age of Onset   Allergies Mother    Heart disease Mother    Arthritis Mother    Lung cancer Mother        heavy smoker   Diabetes Mother    Allergies Father    Heart disease Father    Arthritis Father    Stroke Father    Heart disease Brother    Diabetes Maternal Grandfather    Colitis Daughter    Colon cancer Other        Maternal half uncle   Past Surgical History:  Procedure Laterality Date   ABDOMINAL HYSTERECTOMY N/A    Phreesia 10/21/2019   ANTERIOR AND POSTERIOR REPAIR  1990's   APPENDECTOMY     ARGON LASER APPLICATION  02/05/2021   Procedure: ARGON LASER APPLICATION;  Surgeon: Benancio Deeds, MD;  Location: [facility];  Service: Gastroenterology;;   BREAST LUMPECTOMY  1998   left   BREAST SURGERY N/A    Phreesia 10/21/2019   BRONCHIAL WASHINGS  04/05/2020   Procedure: BRONCHIAL WASHINGS;  Surgeon: Martina Sinner, MD;  Location: [facility];  Service: Pulmonary;;   CAPSULOTOMY Right 08/28/2021   Procedure: CAPSULOTOMY;  Surgeon: Candelaria Stagers, DPM;  Location: [facility];  Service: Podiatry;  Laterality: Right;   CARPOMETACARPEL (CMC) FUSION OF THUMB WITH AUTOGRAFT FROM RADIUS  ~ 2009   "both thumbs" (02/20/2012)   CATARACT EXTRACTION W/ INTRAOCULAR LENS  IMPLANT, BILATERAL  2012   CERVICAL DISCECTOMY  10/2001   C5-C6   CERVICAL FUSION  2003   C3-C4   CHOLECYSTECTOMY     COLONOSCOPY     CORONARY STENT INTERVENTION N/A 01/11/2021   Procedure: CORONARY STENT INTERVENTION;  Surgeon: Marykay Lex, MD;  Location: [facility] INVASIVE CV LAB;  Service: Cardiovascular;  Laterality: N/A;   DEBRIDEMENT TENNIS ELBOW  ?1970's   right   ESOPHAGOGASTRODUODENOSCOPY     ESOPHAGOGASTRODUODENOSCOPY (EGD) WITH PROPOFOL N/A 02/05/2021   Procedure: ESOPHAGOGASTRODUODENOSCOPY (EGD) WITH PROPOFOL;  Surgeon: Benancio Deeds, MD;  Location: [facility];  Service:  Gastroenterology;  Laterality: N/A;   EYE SURGERY N/A  Phreesia 10/21/2019   HAMMER TOE SURGERY Right 08/28/2021   Procedure: HAMMER TOE REPAIR 2ND TOE RIGHT FOOT;  Surgeon: Candelaria Stagers, DPM;  Location: [facility];  Service: Podiatry;  Laterality: Right;   HYSTERECTOMY     I & D EXTREMITY Right 09/22/2020   Procedure: IRRIGATION AND DEBRIDEMENT RIGHT HAND AND ELBOW;  Surgeon: Teryl Lucy, MD;  Location: [facility];  Service: Orthopedics;  Laterality: Right;   ICD IMPLANT N/A 08/27/2022   Procedure: ICD IMPLANT;  Surgeon: Maurice Small, MD;  Location: [facility] INVASIVE CV LAB;  Service: Cardiovascular;  Laterality: N/A;   KNEE ARTHROPLASTY  ?1990's   "?right; w/cartilage repair" (02/20/2012)   MASS EXCISION Left 08/28/2021   Procedure: EXCISION OF SOFT TISSUE  MASS LEFT FOOT;  Surgeon: Candelaria Stagers, DPM;  Location: [facility];  Service: Podiatry;  Laterality: Left;   NASAL SEPTUM SURGERY  1980's   POSTERIOR CERVICAL FUSION/FORAMINOTOMY  2004   "failed initial fusion; rewired  anterior neck" (02/20/2012)   REVERSE SHOULDER ARTHROPLASTY Right 02/16/2020   Procedure: REVERSE SHOULDER ARTHROPLASTY;  Surgeon: Teryl Lucy, MD;  Location: [facility];  Service: Orthopedics;  Laterality: Right;   RIGHT/LEFT HEART CATH AND CORONARY ANGIOGRAPHY N/A 01/11/2021   Procedure: RIGHT/LEFT HEART CATH AND CORONARY ANGIOGRAPHY;  Surgeon: Marykay Lex, MD;  Location: [facility] INVASIVE CV LAB;  Service: Cardiovascular;  Laterality: N/A;   SPINE SURGERY N/A    Phreesia 10/21/2019   TONSILLECTOMY  ~ 1953   VESICOVAGINAL FISTULA CLOSURE W/ TAH  1988   VIDEO BRONCHOSCOPY Bilateral 08/23/2016   Procedure: VIDEO BRONCHOSCOPY WITH FLUORO;  Surgeon: Roslynn Amble, MD;  Location: [facility];  Service: Cardiopulmonary;  Laterality: Bilateral;   VIDEO BRONCHOSCOPY N/A 04/05/2020   Procedure: VIDEO BRONCHOSCOPY WITHOUT FLUORO;  Surgeon: Martina Sinner, MD;  Location: [facility];  Service: Pulmonary;  Laterality: N/A;   Social  History   Social History Narrative   Patient lives at home alone. Patient  divorced.    Patient has her BS degree.   Right handed.   Caffeine- sometimes coffee.      [facility]:   Born in [place], [place]. She worked as a Armed forces operational officer. She has no pets currently. She does have indoor plants. Previously had mold in her home that was remediated. Carpet was removed.          Immunization History  Administered Date(s) Administered   DTaP 08/18/2013   Fluad Quad(high Dose 65+) 10/16/2018, 11/20/2019, 11/03/2020   Hepatitis A 09/04/2007, 03/02/2008   Hepatitis B 01/07/1985, 02/06/1985, 08/17/1985   Influenza Split 11/13/2010, 11/22/2011, 10/20/2012   Influenza Whole 11/14/2009, 11/21/2011   Influenza, High Dose Seasonal PF 11/09/2015, 11/27/2017   Influenza, Quadrivalent, Recombinant, Inj, Pf 11/20/2019   Influenza,inj,Quad PF,6+ Mos 11/06/2013, 11/09/2014, 11/06/2016   Influenza-Unspecified 11/06/2021, 10/12/2022   Meningococcal Conjugate 09/04/2007   Moderna Covid-19 Fall Seasonal Vaccine 62yrs & older 01/08/2022   PFIZER(Purple Top)SARS-COV-2 Vaccination 03/12/2019, 04/01/2019, 12/16/2019, 06/29/2020   Pfizer Covid-19 Vaccine Bivalent Booster 24yrs & up 03/02/2021   Pneumococcal Conjugate-13 05/18/2013   Pneumococcal Polysaccharide-23 01/05/1994, 11/28/2011, 05/27/2017   Respiratory Syncytial Virus Vaccine,Recomb Aduvanted(Arexvy) 11/06/2021   Td 07/24/1995, 03/16/2005   Tdap 08/18/2013   Zoster Recombinant(Shingrix) 09/18/2017   Zoster, Live 03/02/2008     Objective: Vital Signs: There were no vitals taken for this visit.   Physical Exam   Musculoskeletal Exam: ***  CDAI Exam: CDAI Score: -- Patient Global: --; Provider Global: -- Swollen: --; Tender: -- Joint Exam 03/13/2023  No joint exam has been documented for this visit   There is currently no information documented on the homunculus. Go to the Rheumatology activity and complete the homunculus joint  exam.  Investigation: No additional findings.  Imaging: No results found.  Recent Labs: Lab Results  Component Value Date   WBC 9.4 12/06/2022   HGB 10.4 (L) 12/06/2022   PLT 193 12/06/2022   NA 139 12/06/2022   K 4.0 12/06/2022   CL 106 12/06/2022   CO2 21 (L) 12/06/2022   GLUCOSE 86 12/06/2022   BUN 26 (H) 12/06/2022   CREATININE 0.99 12/06/2022   BILITOT 0.5 12/06/2022   ALKPHOS 55 12/06/2022   AST 56 (H) 12/06/2022   ALT 56 (H) 12/06/2022   PROT 6.9 12/06/2022   ALBUMIN 2.7 (L) 12/06/2022   CALCIUM 9.0 12/06/2022   GFRAA 40 (L) 08/25/2020    Speciality Comments: No specialty comments available.  Procedures:  No procedures performed Allergies: Baclofen, Other, Pork-derived products, Shellfish allergy, Shrimp extract, Tetracycline hcl, Xolair [omalizumab], Zoledronic acid, Celecoxib, Dilaudid [hydromorphone hcl], Hydrocodone-acetaminophen, Levofloxacin, Molds & smuts, Morphine and codeine, Oxycodone hcl, Paroxetine, Penicillins, Tetracycline hcl, Oxycodone-acetaminophen, Diltiazem hcl, Dust mite extract, Gamunex [immune globulin], Lactose, Penicillin g procaine, Rituximab, Rofecoxib, and Tree extract   Assessment / Plan:     Visit Diagnoses: No diagnosis found.  ***  Orders: No orders of the defined types were placed in this encounter.  No orders of the defined types were placed in this encounter.    Follow-Up Instructions: No follow-ups on file.   Metta Clines, RT  Note - This record has been created using [employer].  Chart creation errors have been sought, but may not always  have been located. Such creation errors do not reflect on  the standard of medical care.

## 2023-02-28 DIAGNOSIS — M059 Rheumatoid arthritis with rheumatoid factor, unspecified: Secondary | ICD-10-CM | POA: Diagnosis not present

## 2023-02-28 DIAGNOSIS — I130 Hypertensive heart and chronic kidney disease with heart failure and stage 1 through stage 4 chronic kidney disease, or unspecified chronic kidney disease: Secondary | ICD-10-CM | POA: Diagnosis not present

## 2023-02-28 DIAGNOSIS — D631 Anemia in chronic kidney disease: Secondary | ICD-10-CM | POA: Diagnosis not present

## 2023-02-28 DIAGNOSIS — J45901 Unspecified asthma with (acute) exacerbation: Secondary | ICD-10-CM | POA: Diagnosis not present

## 2023-02-28 DIAGNOSIS — I5022 Chronic systolic (congestive) heart failure: Secondary | ICD-10-CM | POA: Diagnosis not present

## 2023-02-28 DIAGNOSIS — E785 Hyperlipidemia, unspecified: Secondary | ICD-10-CM | POA: Diagnosis not present

## 2023-02-28 DIAGNOSIS — E1122 Type 2 diabetes mellitus with diabetic chronic kidney disease: Secondary | ICD-10-CM | POA: Diagnosis not present

## 2023-02-28 DIAGNOSIS — A419 Sepsis, unspecified organism: Secondary | ICD-10-CM | POA: Diagnosis not present

## 2023-02-28 DIAGNOSIS — N1832 Chronic kidney disease, stage 3b: Secondary | ICD-10-CM | POA: Diagnosis not present

## 2023-03-03 NOTE — Progress Notes (Signed)
 Electrophysiology Office Note:   Date:  03/05/2023  ID:  JEYDA SIEBEL, DOB 09/08/1950, MRN [id number]  Primary Cardiologist: Shelda Bruckner, MD Primary Heart Failure: None Electrophysiologist: Eulas FORBES Furbish, MD       History of Present Illness:   Ebony Scott is a 73 y.o. female with h/o sCHF s/p ICD, CAD s/p LAD DES 12/2020, DVT, bronchiectasis, OSA, GERD, hypogammaglobulinemia, & chronic steroid use due to RA seen today for routine electrophysiology followup.   Implant of ICD in 08/2022.  She had subsequent episode of N/V & fever and hospitalization.  Blood cultures were negative.  Re-admitted in 11/2022 for sepsis in setting of CAP (NOS).   Since last being seen in our clinic the patient reports doing overall well.  She notes she has a lot of difficulty with her feet/ankles due to RA.  She is on chronic prednisone . No acute concerns with device.   She denies chest pain, palpitations, dyspnea, PND, orthopnea, nausea, vomiting, dizziness, syncope, edema, weight gain, or early satiety.   Review of systems complete and found to be negative unless listed in HPI.   EP Information / Studies Reviewed:    EKG is ordered today. Personal review as below.  EKG Interpretation Date/Time:  Tuesday March 05 2023 09:03:23 EST Ventricular Rate:  70 PR Interval:  140 QRS Duration:  90 QT Interval:  356 QTC Calculation: 384 R Axis:   16  Text Interpretation: Normal sinus rhythm Minimal voltage criteria for LVH, may be normal variant Confirmed by Aniceto Jarvis ([id number]) on 03/05/2023 9:29:55 AM   ICD Interrogation-  reviewed in detail today,  See PACEART report.  Device History: Abbott Single Chamber ICD implanted 08/27/22 for HFrEF History of appropriate therapy: No History of AAD therapy: No   Studies:  12/2020 LHC > severe single vessel CAD with 95 & 90% stenoses of the proximal and mid LAD at major 1st diagonal branch s/p DES PCI of LAD, moderate pulmonary hypertension / pulmonary  venous hypertension  06/2022 ECHO > LVEF 30-35%, elevated LVEDP, RWMA, moderate hypokinesis of the LV mid-apical anteroseptal wall, apical segment & inferoapical segment. There is moderate hypokinesis of the LV globally, G1 DD           Physical Exam:   VS:  BP (!) 88/48   Pulse 70   Ht 5' (1.524 m)   Wt 132 lb 3.2 oz (60 kg)   SpO2 96%   BMI 25.82 kg/m    Wt Readings from Last 3 Encounters:  03/05/23 132 lb 3.2 oz (60 kg)  02/04/23 135 lb 9.6 oz (61.5 kg)  01/29/23 133 lb 14.4 oz (60.7 kg)     GEN: Well nourished, well developed in no acute distress NECK: No JVD; No carotid bruits CARDIAC: Regular rate and rhythm, no murmurs, rubs, gallops. Implant site c/d/I, well healed without tethering RESPIRATORY:  Clear to auscultation without rales, wheezing or rhonchi  ABDOMEN: Soft, non-tender, non-distended EXTREMITIES:  No edema; No deformity   ASSESSMENT AND PLAN:    Chronic Systolic Dysfunction s/p Abbott single chamber ICD  -euvolemic today -Stable on an appropriate medical regimen -Normal ICD function -See Pace Art report > RV output margin reduce from acute phase to chronic -shock plan reviewed with patient, card given  -no ICD shock  -GDMT per Cardiology    Hx PE / DVT  -continue Xarelto    RA  -on prednisone , per primary   Disposition:   Follow up with Dr. Furbish in 12 months  Signed, Daphne Barrack, MSN, APRN, NP-C, AGACNP-BC [facility] - Electrophysiology  03/05/2023, 9:32 AM

## 2023-03-04 ENCOUNTER — Other Ambulatory Visit: Payer: Self-pay

## 2023-03-04 ENCOUNTER — Telehealth: Payer: Self-pay

## 2023-03-04 ENCOUNTER — Telehealth: Payer: Self-pay | Admitting: Adult Health

## 2023-03-04 DIAGNOSIS — D539 Nutritional anemia, unspecified: Secondary | ICD-10-CM

## 2023-03-04 DIAGNOSIS — I5020 Unspecified systolic (congestive) heart failure: Secondary | ICD-10-CM

## 2023-03-04 DIAGNOSIS — M1120 Other chondrocalcinosis, unspecified site: Secondary | ICD-10-CM

## 2023-03-04 DIAGNOSIS — I82499 Acute embolism and thrombosis of other specified deep vein of unspecified lower extremity: Secondary | ICD-10-CM

## 2023-03-04 DIAGNOSIS — E782 Mixed hyperlipidemia: Secondary | ICD-10-CM

## 2023-03-04 DIAGNOSIS — N1832 Chronic kidney disease, stage 3b: Secondary | ICD-10-CM

## 2023-03-04 DIAGNOSIS — J4550 Severe persistent asthma, uncomplicated: Secondary | ICD-10-CM

## 2023-03-04 DIAGNOSIS — Z86711 Personal history of pulmonary embolism: Secondary | ICD-10-CM

## 2023-03-04 MED ORDER — ATORVASTATIN CALCIUM 80 MG PO TABS: 80.0000 mg | ORAL_TABLET | Freq: Every evening | ORAL | 1 refills | Status: DC

## 2023-03-04 MED ORDER — RIVAROXABAN 20 MG PO TABS: 20.0000 mg | ORAL_TABLET | Freq: Every day | ORAL | 2 refills | Status: DC

## 2023-03-04 MED ORDER — MONTELUKAST SODIUM 10 MG PO TABS: 10.0000 mg | ORAL_TABLET | Freq: Every day | ORAL | 1 refills | Status: DC

## 2023-03-04 MED ORDER — FOLIC ACID 1 MG PO TABS: 1.0000 mg | ORAL_TABLET | Freq: Every day | ORAL | 3 refills | Status: AC

## 2023-03-04 MED ORDER — ALLOPURINOL 100 MG PO TABS: 100.0000 mg | ORAL_TABLET | Freq: Every day | ORAL | 0 refills | Status: AC

## 2023-03-04 NOTE — Telephone Encounter (Signed)
 Patient also needs refill for Nystatin. Needs large bottle. Pharmacy is [facility] [address] Patient phone number is [phone].

## 2023-03-04 NOTE — Telephone Encounter (Signed)
 Attempted to contact patient, left message. Patient should contact the [phone] number on the back of her device card for further recommendation.

## 2023-03-04 NOTE — Telephone Encounter (Signed)
 ATC patient.  Call not accepted at this time.  Trelegy and Nystatin was previously ordered by Dr. Lucie Leather.

## 2023-03-04 NOTE — Telephone Encounter (Signed)
 Patient states needs refill for Trelegy. Pharmacy is [facility] [address] Patient states new pharmacy. Patient out of medication. Patient phone number is [phone].

## 2023-03-04 NOTE — Telephone Encounter (Signed)
 Copied from CRM [id number]. Topic: Clinical - Prescription Issue >> Mar 04, 2023  3:38 PM Gildardo Pounds wrote: Reason for CRM: [facility] [address] Callback number is [phone]

## 2023-03-05 ENCOUNTER — Telehealth: Payer: Self-pay

## 2023-03-05 ENCOUNTER — Telehealth: Payer: Self-pay | Admitting: Cardiovascular Disease

## 2023-03-05 ENCOUNTER — Ambulatory Visit: Payer: Medicare PPO | Attending: Pulmonary Disease | Admitting: Pulmonary Disease

## 2023-03-05 ENCOUNTER — Encounter: Payer: Self-pay | Admitting: Pulmonary Disease

## 2023-03-05 VITALS — BP 88/48 | HR 70 | Ht 60.0 in | Wt 132.2 lb

## 2023-03-05 DIAGNOSIS — N1831 Chronic kidney disease, stage 3a: Secondary | ICD-10-CM | POA: Diagnosis not present

## 2023-03-05 DIAGNOSIS — M0600 Rheumatoid arthritis without rheumatoid factor, unspecified site: Secondary | ICD-10-CM | POA: Diagnosis not present

## 2023-03-05 DIAGNOSIS — I509 Heart failure, unspecified: Secondary | ICD-10-CM | POA: Diagnosis not present

## 2023-03-05 DIAGNOSIS — I429 Cardiomyopathy, unspecified: Secondary | ICD-10-CM | POA: Diagnosis not present

## 2023-03-05 DIAGNOSIS — D631 Anemia in chronic kidney disease: Secondary | ICD-10-CM | POA: Diagnosis not present

## 2023-03-05 DIAGNOSIS — M109 Gout, unspecified: Secondary | ICD-10-CM | POA: Diagnosis not present

## 2023-03-05 LAB — CUP PACEART INCLINIC DEVICE CHECK
Battery Remaining Longevity: 120 mo
Brady Statistic RV Percent Paced: 0 %
Date Time Interrogation Session: 20250114101749
HighPow Impedance: 70.875
Implantable Lead Connection Status: 753985
Implantable Lead Implant Date: 20240708
Implantable Lead Location: 753860
Implantable Pulse Generator Implant Date: 20240708
Lead Channel Impedance Value: 525 Ohm
Lead Channel Pacing Threshold Amplitude: 0.75 V
Lead Channel Pacing Threshold Amplitude: 0.75 V
Lead Channel Pacing Threshold Pulse Width: 0.5 ms
Lead Channel Pacing Threshold Pulse Width: 0.5 ms
Lead Channel Sensing Intrinsic Amplitude: 12 mV
Lead Channel Setting Pacing Amplitude: 2.5 V
Lead Channel Setting Pacing Pulse Width: 0.5 ms
Lead Channel Setting Sensing Sensitivity: 0.5 mV
Pulse Gen Serial Number: 211016307
Zone Setting Status: 755011

## 2023-03-05 NOTE — Telephone Encounter (Signed)
 Spoke with patient and she states she forgot to ask in her visit todayd can she get a "ground mat" for pain?

## 2023-03-05 NOTE — Patient Instructions (Addendum)
 Medication Instructions:  Your physician recommends that you continue on your current medications as directed. Please refer to the Current Medication list given to you today.  *If you need a refill on your cardiac medications before your next appointment, please call your pharmacy*  Lab Work: None ordered.  If you have labs (blood work) drawn today and your tests are completely normal, you will receive your results only by: MyChart Message (if you have MyChart) OR A paper copy in the mail If you have any lab test that is abnormal or we need to change your treatment, we will call you to review the results.  Testing/Procedures: None ordered.  Follow-Up: At [facility], you and your health needs are our priority.  As part of our continuing mission to provide you with exceptional heart care, we have created designated Provider Care Teams.  These Care Teams include your primary Cardiologist (physician) and Advanced Practice Providers (APPs -  Physician Assistants and Nurse Practitioners) who all work together to provide you with the care you need, when you need it.   Your next appointment:   1 year(s)  The format for your next appointment:   In Person  Provider:   Dr Mealor{or one of the following Advanced Practice Providers on your designated Care Team:   Charlies Arthur, PA-C Ozell Jodie Passey, [place] Leotis Barrack, NP  Remote monitoring is used to monitor your Pacemaker/ ICD from home. This monitoring reduces the number of office visits required to check your device to one time per year. It allows us  to keep an eye on the functioning of your device to ensure it is working properly.   Important Information About Sugar

## 2023-03-05 NOTE — Telephone Encounter (Signed)
 I called and spoke with the pt. I informed the pt that the rx's were previously ordered by Dr Lucie Leather, who is not a provider at our office. I informed the pt that she would need to contact her pharmacy or Dr Kathyrn Lass office. Pt verbalized understanding. NFN

## 2023-03-05 NOTE — Telephone Encounter (Signed)
 Patient wants to know if she can use a "ground mat" for pain in her feet and legs.

## 2023-03-05 NOTE — Telephone Encounter (Signed)
 Copied from CRM [id number]. Topic: Clinical - Medical Advice >> Mar 04, 2023  3:32 PM Ebony Scott wrote: Reason for CRM:Pt is requesting 4 wheel walker with a seat

## 2023-03-06 NOTE — Telephone Encounter (Signed)
 Spoke with patient, gave phone number to [facility] Jude to follow up on being able to use grounding mat with ICD. [phone] No further needs at this time

## 2023-03-07 ENCOUNTER — Telehealth: Payer: Self-pay | Admitting: Allergy and Immunology

## 2023-03-07 ENCOUNTER — Other Ambulatory Visit: Payer: Self-pay

## 2023-03-07 DIAGNOSIS — G894 Chronic pain syndrome: Secondary | ICD-10-CM

## 2023-03-07 DIAGNOSIS — R5382 Chronic fatigue, unspecified: Secondary | ICD-10-CM

## 2023-03-07 DIAGNOSIS — M0600 Rheumatoid arthritis without rheumatoid factor, unspecified site: Secondary | ICD-10-CM

## 2023-03-07 DIAGNOSIS — E242 Drug-induced Cushing's syndrome: Secondary | ICD-10-CM

## 2023-03-07 DIAGNOSIS — M797 Fibromyalgia: Secondary | ICD-10-CM

## 2023-03-07 DIAGNOSIS — I5022 Chronic systolic (congestive) heart failure: Secondary | ICD-10-CM

## 2023-03-07 LAB — LAB REPORT - SCANNED
A1c: 6
Creatinine, POC: 106.3 mg/dL
EGFR: 40

## 2023-03-07 NOTE — Telephone Encounter (Signed)
Patient called stating with her new insurance she needs a prior authorization for her Hizentra injection.

## 2023-03-08 ENCOUNTER — Telehealth: Payer: Self-pay

## 2023-03-08 ENCOUNTER — Telehealth: Payer: Self-pay | Admitting: Family Medicine

## 2023-03-08 MED ORDER — TRELEGY ELLIPTA 200-62.5-25 MCG/ACT IN AEPB: INHALATION_SPRAY | RESPIRATORY_TRACT | 1 refills | Status: DC

## 2023-03-08 NOTE — Telephone Encounter (Signed)
Copied from CRM [id number]. Topic: General - Other >> Mar 07, 2023  4:42 PM Antony Haste wrote: Reason for CRM: This patient is requesting DME equipment - a new walker from Dr Tanya Nones, a rollater she states it must be ordered by her PCP. She is also wanting to know if she can use a grounding mat? Callback #: [phone]

## 2023-03-08 NOTE — Telephone Encounter (Signed)
Copied from CRM [id number]. Topic: Clinical - Medication Question >> Mar 07, 2023  4:48 PM Antony Haste wrote: Reason for CRM: This patient would like to know if  her magnesium glycinate medication can be used to help her sleep, she is requesting confirmation from Dr. Tanya Nones.  Callback #: [phone]

## 2023-03-08 NOTE — Telephone Encounter (Signed)
Patient called stating she changed pharmacies and needs the Trelegy sent to [facility] [address].

## 2023-03-08 NOTE — Telephone Encounter (Signed)
Sending new Trelegy prescription to [facility] [address]

## 2023-03-08 NOTE — Addendum Note (Signed)
Addended by: Areta Haber B on: 03/08/2023 12:18 PM   Modules accepted: Orders

## 2023-03-10 IMAGING — CT CT CHEST W/O CM
2 of 4 series · 15 of 36 positions shown, 18 images · non-contrast
Comparison: 05/25/2020

CLINICAL DATA: Worsening shortness of breath with exertion.
Follow-up of pulmonary nodules. Emphysema. Asthma.

EXAM:
CT CHEST WITHOUT CONTRAST
TECHNIQUE: Multidetector CT imaging of the chest was performed following the
standard protocol without IV contrast.

[Series 2: thorax · axial · 0.68mm/px · z∈[-280,-22]mm · 12 of 153 slices shown, 15 images]
[im 12/153  mediastinal]
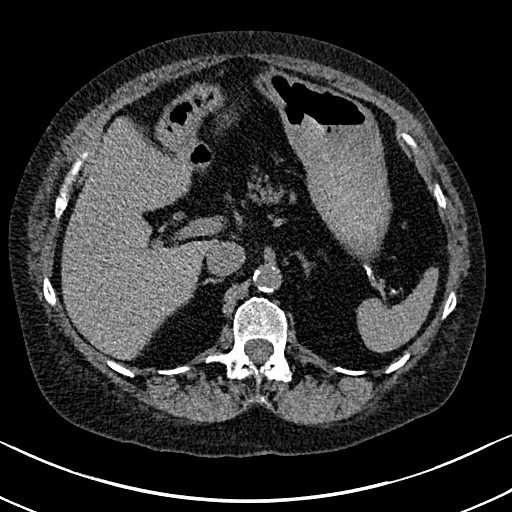
[im 12/153  lung]
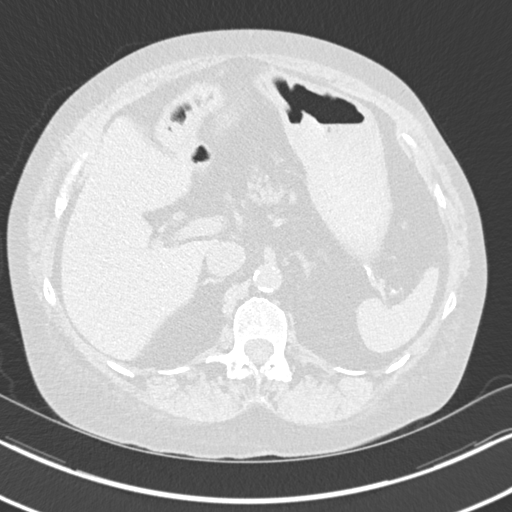
[im 24/153  lung]
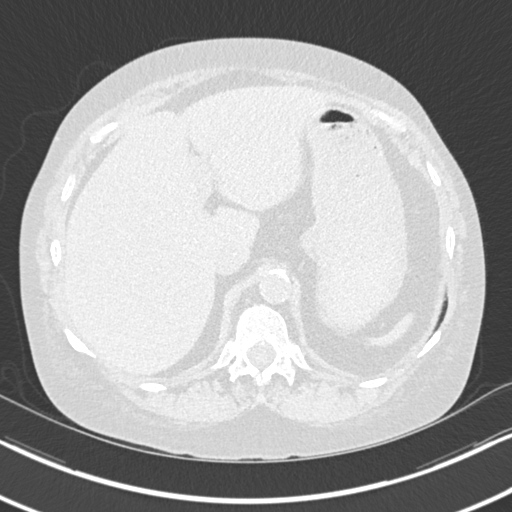
[im 36/153  lung]
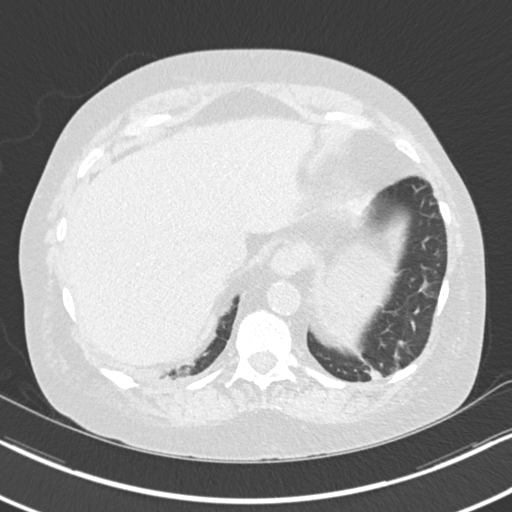
[im 47/153  lung]
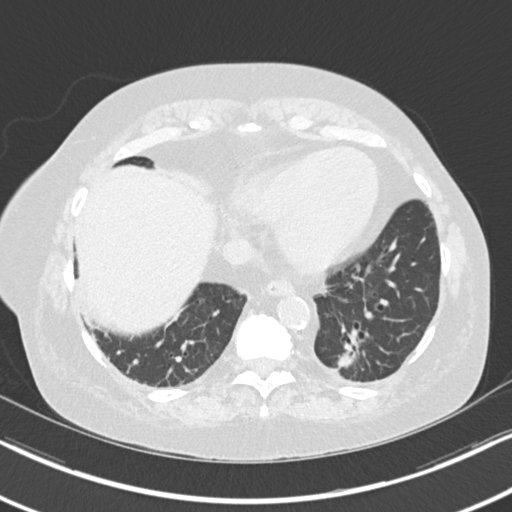
[im 59/153  mediastinal]
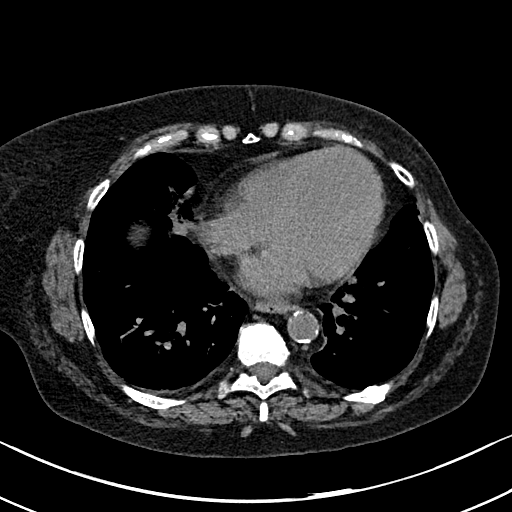
[im 59/153  lung]
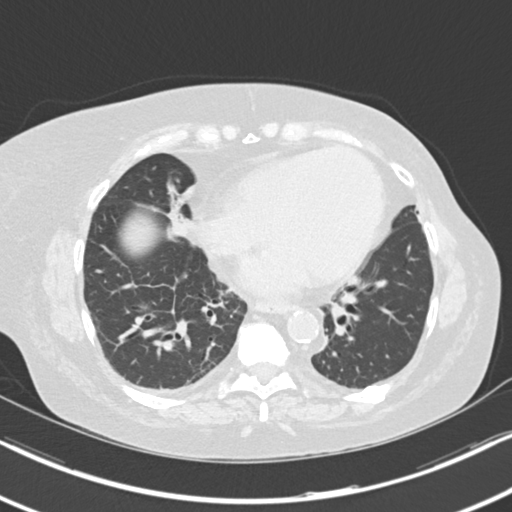
[im 71/153  lung]
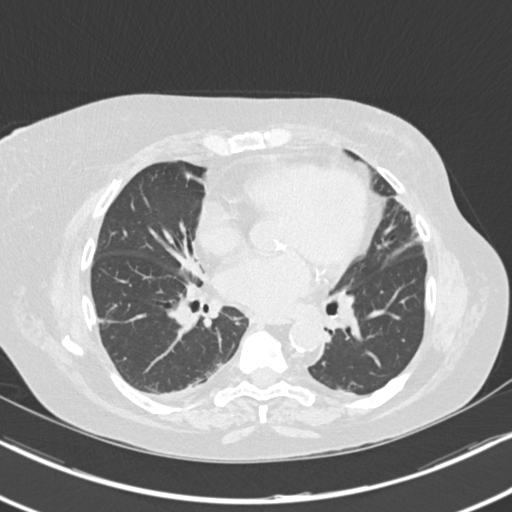
[im 82/153  lung]
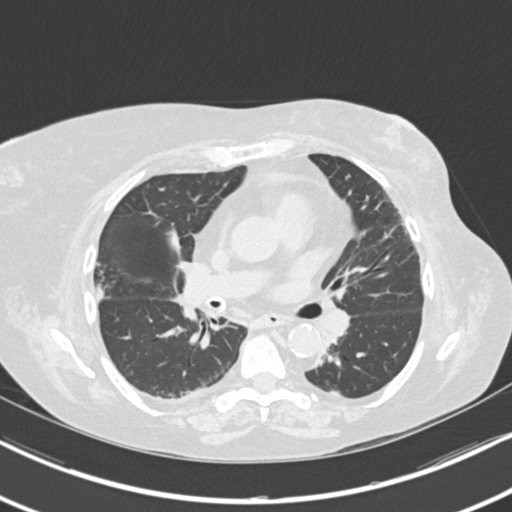
[im 94/153  lung]
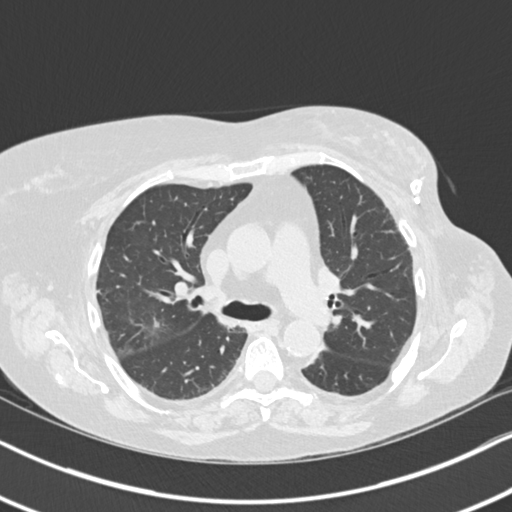
[im 106/153  mediastinal]
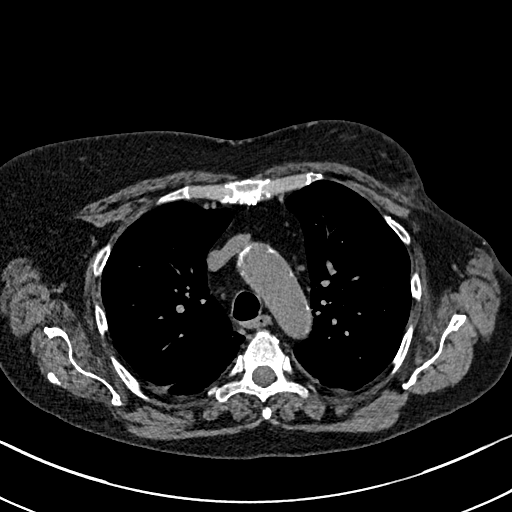
[im 106/153  lung]
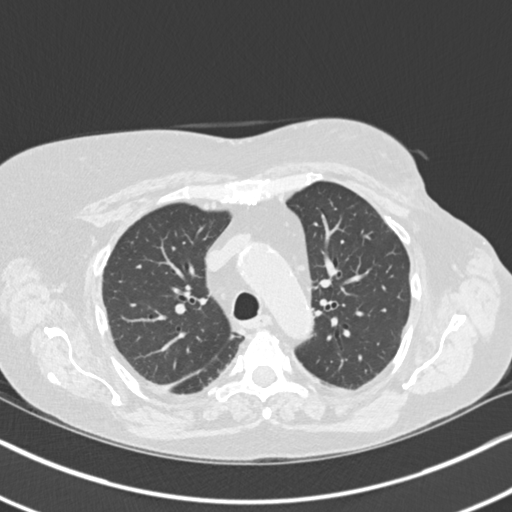
[im 117/153  lung]
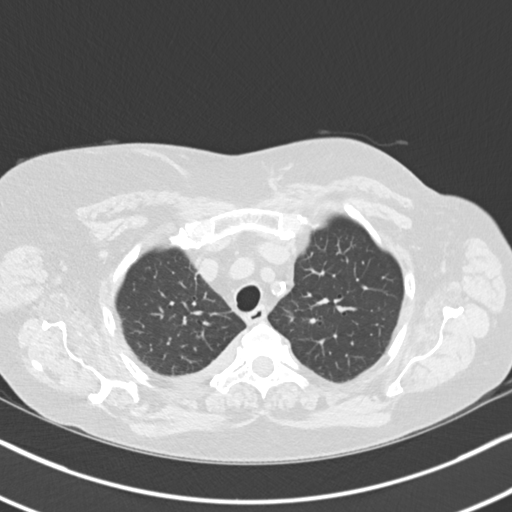
[im 129/153  lung]
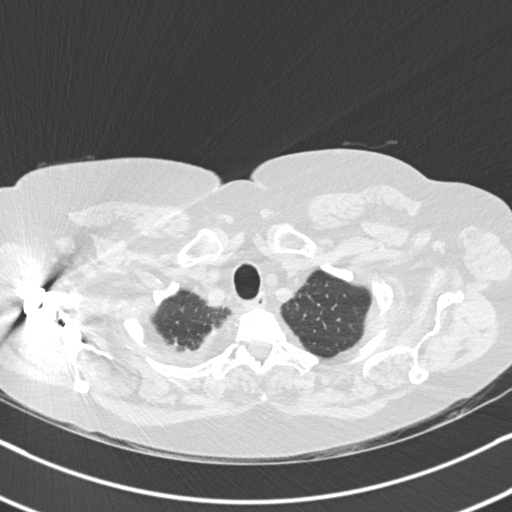
[im 141/153  lung]
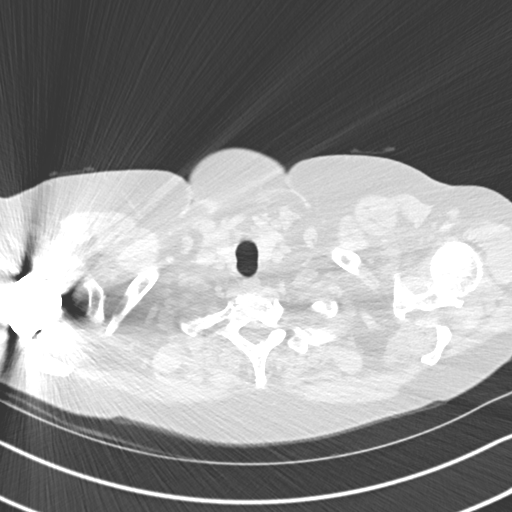

[Series 5: coronal · coronal · 0.60mm/px · 3 of 112 slices shown]
[im 23/112  lung]
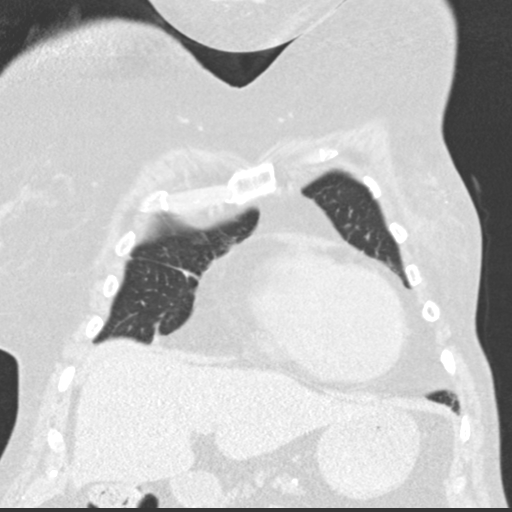
[im 45/112  lung]
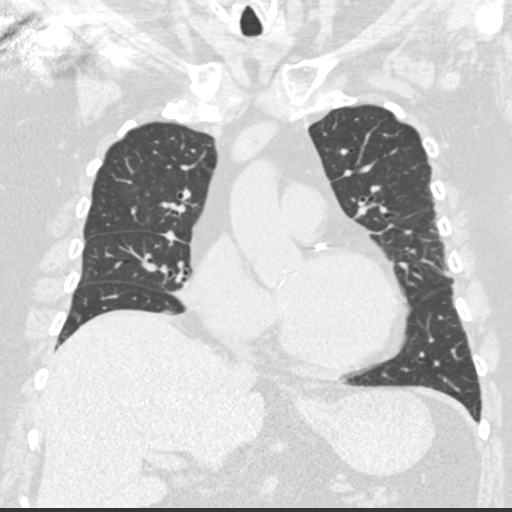
[im 67/112  lung]
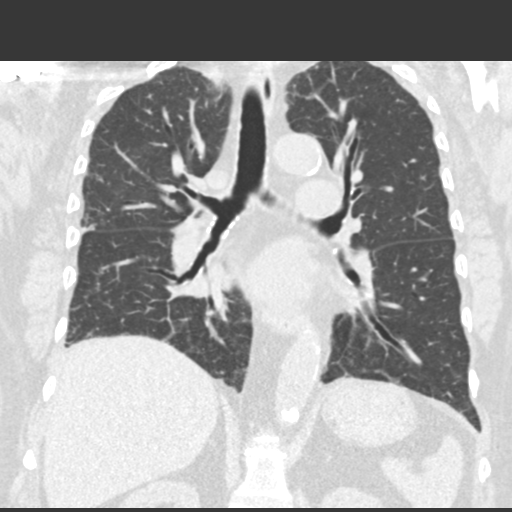

[15 of 36 positions shown; findings below may reference images not displayed]

FINDINGS: Cardiovascular: Aortic atherosclerosis. Tortuous thoracic aorta.
Mild cardiomegaly. Three vessel coronary artery calcification.
Aortic valve calcification.

Mediastinum/Nodes: No axillary adenopathy. No mediastinal or
definite hilar adenopathy, given limitations of unenhanced CT.

Lungs/Pleura: New trace right-sided pleural fluid. Left-sided
pleural thickening or trace fluid, similar.

Areas of mild smooth septal thickening are most apparent at the lung
bases. Example 99/3.

Probable scarring in the left lower lobe medially. The nodular
component measures 1.5 x 1.2 cm on 109/3 . (Previously 1.9 x
cm.)

Subtle subpleural interstitial thickening within the anterior left
upper lobe may be radiation induced given adjacent left breast
surgical changes.

Posterior right upper lobe pulmonary nodule is less distinct today
at 4 mm on 60/3 . (Previously 7 mm).

Anterior right upper lobe pulmonary nodule described on the prior
exam is resolved.

Inferior right middle lobe volume loss including on 94/3 is less
nodular today.

Upper Abdomen: Cholecystectomy. Normal imaged portions of the liver,
spleen, stomach, pancreas, adrenal glands, kidneys.)

Musculoskeletal: Surgical clips and dystrophic calcification in the
lateral left breast. Right shoulder arthroplasty. Remote bilateral
rib fractures are again identified.
IMPRESSION: 1. New trace right pleural fluid and left pleural fluid or
thickening. Concurrent new mild pulmonary interlobular septal
thickening. Constellation of findings suggests mild pulmonary
edema/fluid overload.
2. No other explanation for patient's acute symptoms.
3. Decrease and resolution of pulmonary nodules, currently with
morphology favoring scarring.
4. Coronary artery atherosclerosis. Aortic Atherosclerosis
([id number]).
5. Aortic valvular calcifications. Consider echocardiography to
evaluate for valvular dysfunction.

## 2023-03-13 ENCOUNTER — Ambulatory Visit: Payer: Medicare PPO | Admitting: Internal Medicine

## 2023-03-13 DIAGNOSIS — Z7952 Long term (current) use of systemic steroids: Secondary | ICD-10-CM

## 2023-03-13 DIAGNOSIS — M0600 Rheumatoid arthritis without rheumatoid factor, unspecified site: Secondary | ICD-10-CM

## 2023-03-13 DIAGNOSIS — M797 Fibromyalgia: Secondary | ICD-10-CM

## 2023-03-13 DIAGNOSIS — R197 Diarrhea, unspecified: Secondary | ICD-10-CM

## 2023-03-13 DIAGNOSIS — R21 Rash and other nonspecific skin eruption: Secondary | ICD-10-CM

## 2023-03-14 ENCOUNTER — Other Ambulatory Visit: Payer: Self-pay

## 2023-03-14 MED ORDER — NYSTATIN 100000 UNIT/ML MT SUSP: OROMUCOSAL | 3 refills | Status: DC

## 2023-03-16 ENCOUNTER — Encounter: Payer: Self-pay | Admitting: Cardiovascular Disease

## 2023-03-17 DIAGNOSIS — G4733 Obstructive sleep apnea (adult) (pediatric): Secondary | ICD-10-CM | POA: Diagnosis not present

## 2023-03-17 DIAGNOSIS — J4540 Moderate persistent asthma, uncomplicated: Secondary | ICD-10-CM | POA: Diagnosis not present

## 2023-03-17 DIAGNOSIS — J471 Bronchiectasis with (acute) exacerbation: Secondary | ICD-10-CM | POA: Diagnosis not present

## 2023-03-17 DIAGNOSIS — R0602 Shortness of breath: Secondary | ICD-10-CM | POA: Diagnosis not present

## 2023-03-17 DIAGNOSIS — I5032 Chronic diastolic (congestive) heart failure: Secondary | ICD-10-CM | POA: Diagnosis not present

## 2023-03-17 DIAGNOSIS — M0600 Rheumatoid arthritis without rheumatoid factor, unspecified site: Secondary | ICD-10-CM | POA: Diagnosis not present

## 2023-03-17 DIAGNOSIS — G4736 Sleep related hypoventilation in conditions classified elsewhere: Secondary | ICD-10-CM | POA: Diagnosis not present

## 2023-03-17 DIAGNOSIS — J45909 Unspecified asthma, uncomplicated: Secondary | ICD-10-CM | POA: Diagnosis not present

## 2023-03-17 DIAGNOSIS — G894 Chronic pain syndrome: Secondary | ICD-10-CM | POA: Diagnosis not present

## 2023-03-18 NOTE — Telephone Encounter (Signed)
Called patient l/m advising approval with [employer] done

## 2023-03-21 ENCOUNTER — Other Ambulatory Visit (HOSPITAL_COMMUNITY): Payer: Self-pay

## 2023-03-21 ENCOUNTER — Telehealth: Payer: Self-pay | Admitting: Pulmonary Disease

## 2023-03-21 ENCOUNTER — Telehealth: Payer: Self-pay | Admitting: Cardiology

## 2023-03-21 ENCOUNTER — Telehealth: Payer: Self-pay | Admitting: Internal Medicine

## 2023-03-21 ENCOUNTER — Telehealth: Payer: Self-pay

## 2023-03-21 DIAGNOSIS — I5020 Unspecified systolic (congestive) heart failure: Secondary | ICD-10-CM

## 2023-03-21 DIAGNOSIS — J4551 Severe persistent asthma with (acute) exacerbation: Secondary | ICD-10-CM

## 2023-03-21 MED ORDER — ISOSORB DINITRATE-HYDRALAZINE 20-37.5 MG PO TABS: 1.0000 | ORAL_TABLET | Freq: Two times a day (BID) | ORAL | 0 refills | Status: DC

## 2023-03-21 MED ORDER — SACUBITRIL-VALSARTAN 24-26 MG PO TABS: 1.0000 | ORAL_TABLET | Freq: Two times a day (BID) | ORAL | 0 refills | Status: DC

## 2023-03-21 MED ORDER — CLOPIDOGREL BISULFATE 75 MG PO TABS
75.0000 mg | ORAL_TABLET | Freq: Every day | ORAL | 0 refills | Status: DC
  Filled 2023-03-21: qty 90, 90d supply, fill #0

## 2023-03-21 MED ORDER — IPRATROPIUM-ALBUTEROL 0.5-2.5 (3) MG/3ML IN SOLN: 3.0000 mL | Freq: Four times a day (QID) | RESPIRATORY_TRACT | Status: DC | PRN

## 2023-03-21 MED ORDER — SACUBITRIL-VALSARTAN 24-26 MG PO TABS
1.0000 | ORAL_TABLET | Freq: Two times a day (BID) | ORAL | 0 refills | Status: DC
  Filled 2023-03-21: qty 180, 90d supply, fill #0

## 2023-03-21 MED ORDER — ISOSORB DINITRATE-HYDRALAZINE 20-37.5 MG PO TABS
1.0000 | ORAL_TABLET | Freq: Two times a day (BID) | ORAL | 0 refills | Status: DC
  Filled 2023-03-21: qty 180, 90d supply, fill #0

## 2023-03-21 MED ORDER — CLOPIDOGREL BISULFATE 75 MG PO TABS: 75.0000 mg | ORAL_TABLET | Freq: Every day | ORAL | 0 refills | Status: DC

## 2023-03-21 NOTE — Addendum Note (Signed)
Addended by: Elwin Mocha on: 03/21/2023 04:01 PM   Modules accepted: Orders

## 2023-03-21 NOTE — Telephone Encounter (Signed)
We have the updated pharmacy.

## 2023-03-21 NOTE — Telephone Encounter (Signed)
Patient contacted the office and left a message stating she would like to [facility] [address] Contacted the patient back and advised her pharmacy has already been changed. Patient verbalized understanding.

## 2023-03-21 NOTE — Telephone Encounter (Signed)
*  STAT* If patient is at the pharmacy, call can be transferred to refill team.   1. Which medications need to be refilled? (please list name of each medication and dose if known) clopidogrel (PLAVIX) 75 MG tablet  isosorbide-hydrALAZINE (BIDIL) 20-37.5 MG tablet sacubitril-valsartan (ENTRESTO) 24-26 MG  2. Which pharmacy/location (including street and city if local pharmacy) is medication to be sent to?  [id number] [place] [place] - [address]    3. Do they need a 30 day or 90 day supply?  90

## 2023-03-21 NOTE — Telephone Encounter (Signed)
Limited refills sent to requested pharmacy.

## 2023-03-21 NOTE — Telephone Encounter (Signed)
Pt states she needs [facility] Duoneb [facility] to [facility] [address]. Sending refill. NFN

## 2023-03-21 NOTE — Telephone Encounter (Signed)
Patient calling tp update [facility] [address] for future.

## 2023-03-21 NOTE — Telephone Encounter (Signed)
Patient needs [facility] [address]   All that is prescribed by Dr.Dewald is albuterol

## 2023-03-26 ENCOUNTER — Ambulatory Visit: Payer: Self-pay | Admitting: Family Medicine

## 2023-03-26 ENCOUNTER — Ambulatory Visit: Payer: Medicare PPO | Admitting: Family Medicine

## 2023-03-26 VITALS — BP 80/58 | HR 77 | Temp 99.1°F | Ht 60.0 in | Wt 129.2 lb

## 2023-03-26 DIAGNOSIS — I9589 Other hypotension: Secondary | ICD-10-CM | POA: Diagnosis not present

## 2023-03-26 DIAGNOSIS — I5022 Chronic systolic (congestive) heart failure: Secondary | ICD-10-CM | POA: Diagnosis not present

## 2023-03-26 DIAGNOSIS — R509 Fever, unspecified: Secondary | ICD-10-CM | POA: Diagnosis not present

## 2023-03-26 DIAGNOSIS — R6889 Other general symptoms and signs: Secondary | ICD-10-CM | POA: Diagnosis not present

## 2023-03-26 DIAGNOSIS — J209 Acute bronchitis, unspecified: Secondary | ICD-10-CM | POA: Diagnosis not present

## 2023-03-26 DIAGNOSIS — R5383 Other fatigue: Secondary | ICD-10-CM | POA: Diagnosis not present

## 2023-03-26 LAB — URINALYSIS, ROUTINE W REFLEX MICROSCOPIC
Bilirubin Urine: NEGATIVE
Hgb urine dipstick: NEGATIVE
Hyaline Cast: NONE SEEN /[LPF]
Ketones, ur: NEGATIVE
Nitrite: POSITIVE — AB
Specific Gravity, Urine: 1.015 (ref 1.001–1.035)
pH: 6 (ref 5.0–8.0)

## 2023-03-26 LAB — INFLUENZA A AND B AG, IMMUNOASSAY
INFLUENZA A ANTIGEN: NOT DETECTED
INFLUENZA B ANTIGEN: NOT DETECTED

## 2023-03-26 LAB — MICROSCOPIC MESSAGE

## 2023-03-26 MED ORDER — AZITHROMYCIN 250 MG PO TABS: ORAL_TABLET | ORAL | 0 refills | Status: DC

## 2023-03-26 NOTE — Telephone Encounter (Signed)
  Chief Complaint: fever, lethargy, low oxygen  readings Symptoms: fever 101.8,  pulse ox readings 88-91%, lethargic Frequency: started yesterday Pertinent Negatives: Patient denies CP, SOB Disposition: [] ED /[] Urgent Care (no appt availability in office) / [x] Appointment(In office/virtual)/ []  [facility]/ [] Home Care/ [] Refused Recommended Disposition /[] [facility]/ []  Follow-up with PCP Additional Notes: patient's daughter called with concerns for fever, lethargy and low oxygen  readings 88-91%. Patient with a history of getting infections. Daughter was requesting to be seen as a virtual appointment which was done back in December. CAL called and spoke with Ronal Bradley, Dr. Clara nurse. She stated patient needs to come into office and had an appointment available today at 12:15 pm. Appointment made. Daughter called back and updated on appointment being in the office. Daughter stated that she would get assistance from family to get patient out of the house. Daughter is a engineer, civil (consulting) and does want to avoid the ED if at all possible. Daughter verbalized understanding of plan and all questions answered. Daughter states that if unable to make appointment will call and cancel.     Copied from CRM [id number]. Topic: Clinical - Red Word Triage >> Mar 26, 2023 10:04 AM Powell HERO wrote: Red Word that prompted transfer to Nurse Triage: O2 88/89, high fever of 101.8.  Patient is very lethargic. Reason for Disposition  [1] Fever > 101 F (38.3 C) AND [2] age > 60 years  Answer Assessment - Initial Assessment Questions 1. TEMPERATURE: What is the most recent temperature?  How was it measured?      101.8-temperature after tylenol  99.5 2. ONSET: When did the fever start?      Started this morning 3. CHILLS: Do you have chills? If yes: How bad are they?  (e.g., none, mild, moderate, severe)   - NONE: no chills   - MILD: feeling cold   - MODERATE: feeling very cold, some shivering  (feels better under a thick blanket)   - SEVERE: feeling extremely cold with shaking chills (general body shaking, rigors; even under a thick blanket)      Moderate 4. OTHER SYMPTOMS: Do you have any other symptoms besides the fever?  (e.g., abdomen pain, cough, diarrhea, earache, headache, sore throat, urination pain)     cough 5. CAUSE: If there are no symptoms, ask: What do you think is causing the fever?      Per daughter, this is typical for patient 6. CONTACTS: Does anyone else in the family have an infection?     No 7. TREATMENT: What have you done so far to treat this fever? (e.g., medications)     Tylenol  8. IMMUNOCOMPROMISE: Do you have of the following: diabetes, HIV positive, splenectomy, cancer chemotherapy, chronic steroid treatment, transplant patient, etc.     Yes 10. TRAVEL: Have you traveled out of the country in the last month? (e.g., travel history, exposures)       No  Protocols used: [facility]

## 2023-03-26 NOTE — Progress Notes (Signed)
 Patient Office Visit  Assessment & Plan:  Fever, unspecified fever cause -     CBC with Differential/Platelet -     Azithromycin ; Take 2 tablets (500 mg) PO today, then 1 tablet (250 mg) PO daily x4 days.  Dispense: 6 tablet; Refill: 0 -     Urinalysis, Routine w reflex microscopic -     Urine Culture  Flu-like symptoms -     Influenza A and B Ag, Immunoassay -     CBC with Differential/Platelet -     COMPLETE METABOLIC PANEL WITH GFR  Chronic systolic CHF (congestive heart failure) (HCC)  Other specified hypotension  Acute bronchitis, unspecified organism -     Azithromycin ; Take 2 tablets (500 mg) PO today, then 1 tablet (250 mg) PO daily x4 days.  Dispense: 6 tablet; Refill: 0  Other orders -     Microscopic Message   Flu Test negative.  Follow-up on lab work and notify patient.  Urinalysis positive for nitrite.  Will send urine off for urine culture.  Z-Pak sent to the pharmacy.  If No continue improvement patient and daughter are aware that she will need to go to the ER for further evaluation. No follow-ups on file.   Subjective:    Patient ID: Ebony Scott, female    DOB: [birth year]  Age: 73 y.o. MRN: [id number]  Chief Complaint  Patient presents with   Fever    101.8 fever today. Vomiting. Sx started yesterday.    HPI 73 year old with PMH of  severe asthma, allergies, bronchiectasis, heart failure/cardiomyopathy presents with 2 day history of  Fever/cough/SOB, nausea and vomiting yesterday.  Patient is on chronic steroids and takes prednisone  7.5 mg  per day.  Temp over 101 this AM.  Pt is prone to infection ie pneumonia/bronchitis/sepsis. Pt was hospitalized last year at [facility] with sepsis but daughter states blood cultures were negative. Last time she was sick with bronchitis she got Z pack and got better. Daughter (RN at [employer]) does not want to take her to ER unless she has to. She UTD with flu shot, pneumococcal vaccines. Pt nonsmoker Patient does have Zofran  on hand  for the nausea. Hypotension- per daughter BP have been elevated at home but when patient gets sick her BP goes down. Pt did not eat anything today but has been drinking fluids.  Per daughter she does not think she took her Entresto  today.  Patient did take the prednisone ..  Other was concerned this morning because she did not seem her normal self.  Patient seems to have improved since she got into the office. Pt did not use her CPAP  with oxygen  last night so did not sleep very well.  Heart failure-patient takes Lasix  about twice per week.  Weight has been stable.  Not having any ankle swelling. The ASCVD Risk score (Arnett DK, et al., 2019) failed to calculate for the following reasons:   The valid systolic blood pressure range is 90 to 200 mmHg   The valid total cholesterol range is 130 to 320 mg/dL  Past Medical History:  Diagnosis Date   Anemia    Angio-edema    Breast cancer (HCC) 1998   Left breast, in remission   Bronchiectasis (HCC)    CHF (congestive heart failure) (HCC)    Clostridium difficile colitis 01/14/2018   Coronary artery disease    Depression    some; don't take anything for it (02/20/2012), pt denies dx as of 06/02/21  Diverticulosis    DVT (deep venous thrombosis) (HCC)    Fibromyalgia 11/2011   GAVE (gastric antral vascular ectasia)    GERD (gastroesophageal reflux disease)    Headache(784.0)    related to allergies; more at different times during the year (02/20/2012)   Hemorrhoids    Hiatal hernia    back and neck   History of blood transfusion 2023   1 unit, 5 units of Iron    Hx of adenomatous colonic polyps 04/12/2016   Hypercholesteremia    good cholesterol is high   Hypothyroidism    h/o Graves disease   IBS (irritable bowel syndrome)    Moderate persistent asthma    -FeV1 72% 2011, -IgE 102 2011, CT sinus Neg 2011   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 Cindy Setter   Sleep apnea    uses cpap nightly   Tracheobronchomalacia    Past Surgical History:  Procedure Laterality Date   ABDOMINAL HYSTERECTOMY N/A    Phreesia 10/21/2019   ANTERIOR AND POSTERIOR REPAIR  1990's   APPENDECTOMY     ARGON LASER APPLICATION  02/05/2021   Procedure: ARGON LASER APPLICATION;  Surgeon: Leigh Elspeth SQUIBB, MD;  Location: [facility];  Service: Gastroenterology;;   BREAST LUMPECTOMY  1998   left   BREAST SURGERY N/A    Phreesia 10/21/2019   BRONCHIAL WASHINGS  04/05/2020   Procedure: BRONCHIAL WASHINGS;  Surgeon: Kara Dorn NOVAK, MD;  Location: [facility];  Service: Pulmonary;;   CAPSULOTOMY Right 08/28/2021   Procedure: CAPSULOTOMY;  Surgeon: Tobie Franky SQUIBB, DPM;  Location: [facility];  Service: Podiatry;  Laterality: Right;   CARPOMETACARPEL (CMC) FUSION OF THUMB WITH AUTOGRAFT FROM RADIUS  ~ 2009   both thumbs (02/20/2012)   CATARACT EXTRACTION W/ INTRAOCULAR LENS  IMPLANT, BILATERAL  2012   CERVICAL DISCECTOMY  10/2001   C5-C6   CERVICAL FUSION  2003   C3-C4   CHOLECYSTECTOMY     COLONOSCOPY     CORONARY STENT INTERVENTION N/A 01/11/2021   Procedure: CORONARY STENT INTERVENTION;  Surgeon: Anner Alm ORN, MD;  Location: [facility];  Service: Cardiovascular;  Laterality: N/A;   DEBRIDEMENT TENNIS ELBOW  ?1970's   right   ESOPHAGOGASTRODUODENOSCOPY     ESOPHAGOGASTRODUODENOSCOPY (EGD) WITH PROPOFOL  N/A 02/05/2021   Procedure: ESOPHAGOGASTRODUODENOSCOPY (EGD) WITH PROPOFOL ;  Surgeon: Leigh Elspeth SQUIBB, MD;  Location: [facility];  Service: Gastroenterology;  Laterality: N/A;   EYE SURGERY N/A    Phreesia 10/21/2019   HAMMER TOE SURGERY Right 08/28/2021   Procedure: HAMMER TOE REPAIR 2ND TOE RIGHT FOOT;  Surgeon: Tobie Franky SQUIBB, DPM;  Location: [facility];  Service: Podiatry;  Laterality: Right;   HYSTERECTOMY     I & D EXTREMITY Right 09/22/2020    Procedure: IRRIGATION AND DEBRIDEMENT RIGHT HAND AND ELBOW;  Surgeon: Josefina Chew, MD;  Location: [facility];  Service: Orthopedics;  Laterality: Right;   ICD IMPLANT N/A 08/27/2022   Procedure: ICD IMPLANT;  Surgeon: Nancey Eulas BRAVO, MD;  [facility];  Service: Cardiovascular;  Laterality: N/A;   KNEE ARTHROPLASTY  ?8009'd   ?right; w/cartilage repair (02/20/2012)   MASS EXCISION Left 08/28/2021   Procedure: EXCISION OF SOFT TISSUE  MASS LEFT FOOT;  Surgeon: Tobie Franky SQUIBB, DPM;  Location: [facility];  Service: Podiatry;  Laterality: Left;   NASAL SEPTUM SURGERY  1980's   POSTERIOR CERVICAL FUSION/FORAMINOTOMY  2004   failed initial fusion; rewired  anterior neck (02/20/2012)   REVERSE SHOULDER ARTHROPLASTY Right 02/16/2020   Procedure: REVERSE SHOULDER ARTHROPLASTY;  Surgeon: Josefina Chew, MD;  Location: [facility];  Service: Orthopedics;  Laterality: Right;   RIGHT/LEFT HEART CATH AND CORONARY ANGIOGRAPHY N/A 01/11/2021   Procedure: RIGHT/LEFT HEART CATH AND CORONARY ANGIOGRAPHY;  Surgeon: Anner Alm ORN, MD;  Location: [facility] CV [facility];  Service: Cardiovascular;  Laterality: N/A;   SPINE SURGERY N/A    Phreesia 10/21/2019   TONSILLECTOMY  ~ 1953   VESICOVAGINAL FISTULA CLOSURE W/ TAH  1988   VIDEO BRONCHOSCOPY Bilateral 08/23/2016   Procedure: VIDEO BRONCHOSCOPY WITH FLUORO;  Surgeon: Noreen Tonnie BRAVO, MD;  Location: [facility];  Service: Cardiopulmonary;  Laterality: Bilateral;   VIDEO BRONCHOSCOPY N/A 04/05/2020   Procedure: VIDEO BRONCHOSCOPY WITHOUT FLUORO;  Surgeon: Kara Dorn NOVAK, MD;  Location: [facility];  Service: Pulmonary;  Laterality: N/A;   Social History   Tobacco Use   Smoking status: Never    Passive exposure: Past   Smokeless tobacco: Never   Tobacco comments:    Parents  Vaping Use   Vaping status: Never Used  Substance Use Topics   Alcohol  use: Not Currently    Alcohol /week: 0.0 standard drinks of alcohol    Drug use: No   Family History   Problem Relation Age of Onset   Allergies Mother    Heart disease Mother    Arthritis Mother    Lung cancer Mother        heavy smoker   Diabetes Mother    Allergies Father    Heart disease Father    Arthritis Father    Stroke Father    Heart disease Brother    Diabetes Maternal Grandfather    Colitis Daughter    Colon cancer Other        Maternal half uncle   Allergies  Allergen Reactions   Baclofen  Anaphylaxis and Other (See Comments)    Altered mental status requiring a 3-day hospital stay- patient became unresponsive   Put me in a coma for five days, Altered mental status requiring a 3-day hospital stay- patient became unresponsive   Other Shortness Of Breath and Other (See Comments)    Grass and weeds sneezing; filled sinuses (02/20/2012)   Pork-Derived Products Anaphylaxis   Shellfish Allergy  Anaphylaxis, Shortness Of Breath, Itching and Rash   Shrimp Extract Anaphylaxis and Other (See Comments)    ALL SHELLFISH   Tetracycline Hcl Nausea And Vomiting   Xolair  [Omalizumab ] Other (See Comments)    Caused Blood clot   Zoledronic  Acid Other (See Comments)    Reclast - Fever, Put in hospital, dr said it was a reaction from a reaction    Fever, inability to walk, Put in hospital  Fever, Put in hospital, dr said it was a reaction from a reaction , Other reaction(s): Unknown   Celecoxib Swelling    Feet swelling   Dilaudid  [Hydromorphone  Hcl] Itching   Hydrocodone -Acetaminophen  Itching   Levofloxacin  Other (See Comments)    GI upset   Molds & Smuts Cough   Morphine  And Codeine Hives and Itching   Oxycodone  Hcl Nausea And Vomiting    GI Intolerance   Paroxetine Nausea And Vomiting and Other (See Comments)    GI Intolerance also   Penicillins Rash and Other (See Comments)    welts (02/20/2012)  Tolerated Ancef   02/16/20   Tetracycline Hcl Other (See Comments)    GI Intolerance   Oxycodone -Acetaminophen  Itching   Diltiazem  Hcl Swelling   Dust Mite Extract  Other (See Comments) and Cough    sneezing (02/20/2012) too   Gamunex [Immune Globulin ] Itching and Rash    Tolerates hizentra     Lactose Other (See Comments)    Bloating and gas   Penicillin G Procaine Rash and Other (See Comments)    Welts, also   Rituximab  Other (See Comments)    Anemia    Rofecoxib Swelling    Vioxx- feet swelling   Tree Extract Itching    tested and told I was allergic to it; never experienced a reaction to it (02/20/2012)    ROS    Objective:    BP (!) 80/58   Pulse 77   Temp 99.1 F (37.3 C)   Ht 5' (1.524 m)   Wt 129 lb 4 oz (58.6 kg)   SpO2 94%   BMI 25.24 kg/m  BP Readings from Last 3 Encounters:  03/26/23 (!) 80/58  03/05/23 (!) 88/48  02/04/23 124/70   Wt Readings from Last 3 Encounters:  03/26/23 129 lb 4 oz (58.6 kg)  03/05/23 132 lb 3.2 oz (60 kg)  02/04/23 135 lb 9.6 oz (61.5 kg)    Physical Exam Vitals and nursing note reviewed.  Constitutional:      General: She is not in acute distress.    Appearance: Normal appearance.     Comments: Patient comes in with her daughter   HENT:     Head: Normocephalic.     Right Ear: Tympanic membrane, ear canal and external ear normal.     Left Ear: Tympanic membrane, ear canal and external ear normal.     Nose: Congestion present.  Eyes:     Extraocular Movements: Extraocular movements intact.     Conjunctiva/sclera: Conjunctivae normal.     Pupils: Pupils are equal, round, and reactive to light.  Cardiovascular:     Rate and Rhythm: Normal rate and regular rhythm.     Heart sounds: Normal heart sounds.  Pulmonary:     Effort: Pulmonary effort is normal.     Breath sounds: Normal breath sounds. No wheezing or rhonchi.  Musculoskeletal:     Right lower leg: No edema.     Left lower leg: No edema.  Neurological:     General: No focal deficit present.     Mental Status: She is alert and oriented to person, place, and time.  Psychiatric:        Mood and Affect: Mood normal.         Behavior: Behavior normal.      Results for orders placed or performed in visit on 03/26/23  Influenza A and B Ag, Immunoassay  Result Value Ref Range   Source: NASAL    INFLUENZA A ANTIGEN NOT DETECTED NOT DETECTED   INFLUENZA B ANTIGEN NOT DETECTED NOT DETECTED  Urinalysis, Routine w reflex microscopic  Result Value Ref Range   Color, Urine YELLOW YELLOW   APPearance CLOUDY (A) CLEAR   Specific Gravity, Urine 1.015 1.001 - 1.035   pH 6.0 5.0 - 8.0   Glucose, UA 2+ (A) NEGATIVE   Bilirubin Urine NEGATIVE NEGATIVE   Ketones, ur NEGATIVE NEGATIVE   Hgb urine dipstick NEGATIVE NEGATIVE   Protein, ur 1+ (A) NEGATIVE   Nitrite POSITIVE (A) NEGATIVE   Leukocytes,Ua TRACE (A) NEGATIVE   WBC, UA 0-5 0 - 5 /  HPF   RBC / HPF 0-2 0 - 2 /HPF   Squamous Epithelial / HPF 0-5 < OR = 5 /HPF   Bacteria, UA MANY (A) NONE SEEN /HPF   Hyaline Cast NONE SEEN NONE SEEN /LPF  Microscopic Message  Result Value Ref Range   Note

## 2023-03-27 ENCOUNTER — Encounter: Payer: Self-pay | Admitting: Family Medicine

## 2023-03-27 LAB — CBC WITH DIFFERENTIAL/PLATELET
Absolute Lymphocytes: 745 {cells}/uL — ABNORMAL LOW (ref 850–3900)
Absolute Monocytes: 2158 {cells}/uL — ABNORMAL HIGH (ref 200–950)
Basophils Absolute: 46 {cells}/uL (ref 0–200)
Basophils Relative: 0.3 %
Eosinophils Absolute: 30 {cells}/uL (ref 15–500)
Eosinophils Relative: 0.2 %
HCT: 34.8 % — ABNORMAL LOW (ref 35.0–45.0)
Hemoglobin: 11.3 g/dL — ABNORMAL LOW (ref 11.7–15.5)
MCH: 34 pg — ABNORMAL HIGH (ref 27.0–33.0)
MCHC: 32.5 g/dL (ref 32.0–36.0)
MCV: 104.8 fL — ABNORMAL HIGH (ref 80.0–100.0)
MPV: 11.6 fL (ref 7.5–12.5)
Monocytes Relative: 14.2 %
Neutro Abs: 12221 {cells}/uL — ABNORMAL HIGH (ref 1500–7800)
Neutrophils Relative %: 80.4 %
Platelets: 196 10*3/uL (ref 140–400)
RBC: 3.32 10*6/uL — ABNORMAL LOW (ref 3.80–5.10)
RDW: 14.7 % (ref 11.0–15.0)
Total Lymphocyte: 4.9 %
WBC: 15.2 10*3/uL — ABNORMAL HIGH (ref 3.8–10.8)

## 2023-03-27 LAB — COMPLETE METABOLIC PANEL WITH GFR
AG Ratio: 1.3 (calc) (ref 1.0–2.5)
ALT: 36 U/L — ABNORMAL HIGH (ref 6–29)
AST: 29 U/L (ref 10–35)
Albumin: 4 g/dL (ref 3.6–5.1)
Alkaline phosphatase (APISO): 48 U/L (ref 37–153)
BUN/Creatinine Ratio: 16 (calc) (ref 6–22)
BUN: 30 mg/dL — ABNORMAL HIGH (ref 7–25)
CO2: 24 mmol/L (ref 20–32)
Calcium: 10.2 mg/dL (ref 8.6–10.4)
Chloride: 103 mmol/L (ref 98–110)
Creat: 1.9 mg/dL — ABNORMAL HIGH (ref 0.60–1.00)
Globulin: 3.1 g/dL (ref 1.9–3.7)
Glucose, Bld: 132 mg/dL — ABNORMAL HIGH (ref 65–99)
Potassium: 4.4 mmol/L (ref 3.5–5.3)
Sodium: 140 mmol/L (ref 135–146)
Total Bilirubin: 1.1 mg/dL (ref 0.2–1.2)
Total Protein: 7.1 g/dL (ref 6.1–8.1)
eGFR: 27 mL/min/{1.73_m2} — ABNORMAL LOW (ref >=60)

## 2023-03-27 LAB — URINE CULTURE
MICRO NUMBER:: 16039149
SPECIMEN QUALITY:: ADEQUATE

## 2023-03-28 DIAGNOSIS — J4551 Severe persistent asthma with (acute) exacerbation: Secondary | ICD-10-CM | POA: Diagnosis not present

## 2023-03-29 ENCOUNTER — Other Ambulatory Visit: Payer: Self-pay

## 2023-03-29 ENCOUNTER — Inpatient Hospital Stay (HOSPITAL_COMMUNITY)
Admission: EM | Admit: 2023-03-29 | Discharge: 2023-04-01 | DRG: 178 | Disposition: A | Discharge status: Home Health | Payer: Medicare PPO | Attending: Internal Medicine | Admitting: Internal Medicine

## 2023-03-29 ENCOUNTER — Emergency Department (HOSPITAL_COMMUNITY): Payer: Medicare PPO

## 2023-03-29 ENCOUNTER — Other Ambulatory Visit: Payer: Medicare PPO

## 2023-03-29 ENCOUNTER — Encounter (HOSPITAL_COMMUNITY): Payer: Self-pay

## 2023-03-29 DIAGNOSIS — N179 Acute kidney failure, unspecified: Secondary | ICD-10-CM | POA: Diagnosis not present

## 2023-03-29 DIAGNOSIS — Z9981 Dependence on supplemental oxygen: Secondary | ICD-10-CM

## 2023-03-29 DIAGNOSIS — Z7951 Long term (current) use of inhaled steroids: Secondary | ICD-10-CM

## 2023-03-29 DIAGNOSIS — R918 Other nonspecific abnormal finding of lung field: Secondary | ICD-10-CM | POA: Diagnosis not present

## 2023-03-29 DIAGNOSIS — M797 Fibromyalgia: Secondary | ICD-10-CM | POA: Diagnosis present

## 2023-03-29 DIAGNOSIS — Z881 Allergy status to other antibiotic agents status: Secondary | ICD-10-CM

## 2023-03-29 DIAGNOSIS — R531 Weakness: Secondary | ICD-10-CM | POA: Diagnosis not present

## 2023-03-29 DIAGNOSIS — Z7952 Long term (current) use of systemic steroids: Secondary | ICD-10-CM

## 2023-03-29 DIAGNOSIS — Z7984 Long term (current) use of oral hypoglycemic drugs: Secondary | ICD-10-CM

## 2023-03-29 DIAGNOSIS — Z9109 Other allergy status, other than to drugs and biological substances: Secondary | ICD-10-CM

## 2023-03-29 DIAGNOSIS — E1122 Type 2 diabetes mellitus with diabetic chronic kidney disease: Secondary | ICD-10-CM | POA: Diagnosis not present

## 2023-03-29 DIAGNOSIS — Z7901 Long term (current) use of anticoagulants: Secondary | ICD-10-CM | POA: Diagnosis not present

## 2023-03-29 DIAGNOSIS — J9601 Acute respiratory failure with hypoxia: Secondary | ICD-10-CM | POA: Diagnosis not present

## 2023-03-29 DIAGNOSIS — J690 Pneumonitis due to inhalation of food and vomit: Secondary | ICD-10-CM | POA: Diagnosis not present

## 2023-03-29 DIAGNOSIS — R059 Cough, unspecified: Secondary | ICD-10-CM | POA: Diagnosis not present

## 2023-03-29 DIAGNOSIS — Z95810 Presence of automatic (implantable) cardiac defibrillator: Secondary | ICD-10-CM

## 2023-03-29 DIAGNOSIS — M069 Rheumatoid arthritis, unspecified: Secondary | ICD-10-CM | POA: Diagnosis present

## 2023-03-29 DIAGNOSIS — R058 Other specified cough: Secondary | ICD-10-CM | POA: Diagnosis not present

## 2023-03-29 DIAGNOSIS — Z87892 Personal history of anaphylaxis: Secondary | ICD-10-CM

## 2023-03-29 DIAGNOSIS — E785 Hyperlipidemia, unspecified: Secondary | ICD-10-CM | POA: Diagnosis present

## 2023-03-29 DIAGNOSIS — Z9049 Acquired absence of other specified parts of digestive tract: Secondary | ICD-10-CM

## 2023-03-29 DIAGNOSIS — R6520 Severe sepsis without septic shock: Secondary | ICD-10-CM | POA: Diagnosis not present

## 2023-03-29 DIAGNOSIS — I2510 Atherosclerotic heart disease of native coronary artery without angina pectoris: Secondary | ICD-10-CM | POA: Diagnosis present

## 2023-03-29 DIAGNOSIS — R6521 Severe sepsis with septic shock: Secondary | ICD-10-CM | POA: Diagnosis present

## 2023-03-29 DIAGNOSIS — E039 Hypothyroidism, unspecified: Secondary | ICD-10-CM | POA: Diagnosis present

## 2023-03-29 DIAGNOSIS — J189 Pneumonia, unspecified organism: Secondary | ICD-10-CM | POA: Diagnosis not present

## 2023-03-29 DIAGNOSIS — D801 Nonfamilial hypogammaglobulinemia: Secondary | ICD-10-CM | POA: Diagnosis present

## 2023-03-29 DIAGNOSIS — I959 Hypotension, unspecified: Secondary | ICD-10-CM | POA: Diagnosis not present

## 2023-03-29 DIAGNOSIS — Z79899 Other long term (current) drug therapy: Secondary | ICD-10-CM | POA: Diagnosis not present

## 2023-03-29 DIAGNOSIS — Z800 Family history of malignant neoplasm of digestive organs: Secondary | ICD-10-CM

## 2023-03-29 DIAGNOSIS — N1831 Chronic kidney disease, stage 3a: Secondary | ICD-10-CM | POA: Diagnosis not present

## 2023-03-29 DIAGNOSIS — A419 Sepsis, unspecified organism: Secondary | ICD-10-CM | POA: Diagnosis not present

## 2023-03-29 DIAGNOSIS — J9621 Acute and chronic respiratory failure with hypoxia: Secondary | ICD-10-CM | POA: Diagnosis present

## 2023-03-29 DIAGNOSIS — Z801 Family history of malignant neoplasm of trachea, bronchus and lung: Secondary | ICD-10-CM

## 2023-03-29 DIAGNOSIS — Z1152 Encounter for screening for COVID-19: Secondary | ICD-10-CM

## 2023-03-29 DIAGNOSIS — R0902 Hypoxemia: Secondary | ICD-10-CM | POA: Diagnosis present

## 2023-03-29 DIAGNOSIS — J470 Bronchiectasis with acute lower respiratory infection: Secondary | ICD-10-CM | POA: Diagnosis present

## 2023-03-29 DIAGNOSIS — Z96651 Presence of right artificial knee joint: Secondary | ICD-10-CM | POA: Diagnosis present

## 2023-03-29 DIAGNOSIS — K219 Gastro-esophageal reflux disease without esophagitis: Secondary | ICD-10-CM | POA: Diagnosis present

## 2023-03-29 DIAGNOSIS — E860 Dehydration: Secondary | ICD-10-CM | POA: Diagnosis present

## 2023-03-29 DIAGNOSIS — Z885 Allergy status to narcotic agent status: Secondary | ICD-10-CM

## 2023-03-29 DIAGNOSIS — I5022 Chronic systolic (congestive) heart failure: Secondary | ICD-10-CM | POA: Diagnosis present

## 2023-03-29 DIAGNOSIS — Z8261 Family history of arthritis: Secondary | ICD-10-CM

## 2023-03-29 DIAGNOSIS — Z886 Allergy status to analgesic agent status: Secondary | ICD-10-CM

## 2023-03-29 DIAGNOSIS — Z8249 Family history of ischemic heart disease and other diseases of the circulatory system: Secondary | ICD-10-CM

## 2023-03-29 DIAGNOSIS — Z955 Presence of coronary angioplasty implant and graft: Secondary | ICD-10-CM

## 2023-03-29 DIAGNOSIS — Z7983 Long term (current) use of bisphosphonates: Secondary | ICD-10-CM | POA: Diagnosis not present

## 2023-03-29 DIAGNOSIS — Z9841 Cataract extraction status, right eye: Secondary | ICD-10-CM

## 2023-03-29 DIAGNOSIS — Z823 Family history of stroke: Secondary | ICD-10-CM

## 2023-03-29 DIAGNOSIS — J4550 Severe persistent asthma, uncomplicated: Secondary | ICD-10-CM | POA: Diagnosis present

## 2023-03-29 DIAGNOSIS — I130 Hypertensive heart and chronic kidney disease with heart failure and stage 1 through stage 4 chronic kidney disease, or unspecified chronic kidney disease: Secondary | ICD-10-CM | POA: Diagnosis present

## 2023-03-29 DIAGNOSIS — J984 Other disorders of lung: Secondary | ICD-10-CM | POA: Diagnosis not present

## 2023-03-29 DIAGNOSIS — Z91014 Allergy to mammalian meats: Secondary | ICD-10-CM

## 2023-03-29 DIAGNOSIS — Z888 Allergy status to other drugs, medicaments and biological substances status: Secondary | ICD-10-CM

## 2023-03-29 DIAGNOSIS — E119 Type 2 diabetes mellitus without complications: Secondary | ICD-10-CM

## 2023-03-29 DIAGNOSIS — Z853 Personal history of malignant neoplasm of breast: Secondary | ICD-10-CM

## 2023-03-29 DIAGNOSIS — R1111 Vomiting without nausea: Secondary | ICD-10-CM | POA: Diagnosis not present

## 2023-03-29 DIAGNOSIS — Z981 Arthrodesis status: Secondary | ICD-10-CM

## 2023-03-29 DIAGNOSIS — Z880 Allergy status to penicillin: Secondary | ICD-10-CM

## 2023-03-29 DIAGNOSIS — Z90710 Acquired absence of both cervix and uterus: Secondary | ICD-10-CM

## 2023-03-29 DIAGNOSIS — I2699 Other pulmonary embolism without acute cor pulmonale: Secondary | ICD-10-CM | POA: Diagnosis present

## 2023-03-29 DIAGNOSIS — R110 Nausea: Secondary | ICD-10-CM | POA: Diagnosis not present

## 2023-03-29 DIAGNOSIS — Z91013 Allergy to seafood: Secondary | ICD-10-CM

## 2023-03-29 DIAGNOSIS — Z961 Presence of intraocular lens: Secondary | ICD-10-CM | POA: Diagnosis present

## 2023-03-29 DIAGNOSIS — Z833 Family history of diabetes mellitus: Secondary | ICD-10-CM

## 2023-03-29 DIAGNOSIS — Z96611 Presence of right artificial shoulder joint: Secondary | ICD-10-CM | POA: Diagnosis present

## 2023-03-29 DIAGNOSIS — R0602 Shortness of breath: Secondary | ICD-10-CM | POA: Diagnosis not present

## 2023-03-29 DIAGNOSIS — Z9842 Cataract extraction status, left eye: Secondary | ICD-10-CM

## 2023-03-29 DIAGNOSIS — Z7902 Long term (current) use of antithrombotics/antiplatelets: Secondary | ICD-10-CM

## 2023-03-29 LAB — CBC WITH DIFFERENTIAL/PLATELET
Abs Immature Granulocytes: 0.06 10*3/uL (ref 0.00–0.07)
Basophils Absolute: 0 10*3/uL (ref 0.0–0.1)
Basophils Relative: 0 %
Eosinophils Absolute: 0.2 10*3/uL (ref 0.0–0.5)
Eosinophils Relative: 2 %
HCT: 30.2 % — ABNORMAL LOW (ref 36.0–46.0)
Hemoglobin: 9.3 g/dL — ABNORMAL LOW (ref 12.0–15.0)
Immature Granulocytes: 1 %
Lymphocytes Relative: 13 %
Lymphs Abs: 1.3 10*3/uL (ref 0.7–4.0)
MCH: 33.8 pg (ref 26.0–34.0)
MCHC: 30.8 g/dL (ref 30.0–36.0)
MCV: 109.8 fL — ABNORMAL HIGH (ref 80.0–100.0)
Monocytes Absolute: 1.5 10*3/uL — ABNORMAL HIGH (ref 0.1–1.0)
Monocytes Relative: 15 %
Neutro Abs: 6.8 10*3/uL (ref 1.7–7.7)
Neutrophils Relative %: 69 %
Platelets: 171 10*3/uL (ref 150–400)
RBC: 2.75 MIL/uL — ABNORMAL LOW (ref 3.87–5.11)
RDW: 16.7 % — ABNORMAL HIGH (ref 11.5–15.5)
WBC: 9.9 10*3/uL (ref 4.0–10.5)
nRBC: 0 % (ref 0.0–0.2)

## 2023-03-29 LAB — COMPREHENSIVE METABOLIC PANEL
ALT: 24 U/L (ref 0–44)
AST: 27 U/L (ref 15–41)
Albumin: 2.7 g/dL — ABNORMAL LOW (ref 3.5–5.0)
Alkaline Phosphatase: 45 U/L (ref 38–126)
Anion gap: 11 (ref 5–15)
BUN: 45 mg/dL — ABNORMAL HIGH (ref 8–23)
CO2: 21 mmol/L — ABNORMAL LOW (ref 22–32)
Calcium: 8.7 mg/dL — ABNORMAL LOW (ref 8.9–10.3)
Chloride: 110 mmol/L (ref 98–111)
Creatinine, Ser: 3.12 mg/dL — ABNORMAL HIGH (ref 0.44–1.00)
GFR, Estimated: 15 mL/min — ABNORMAL LOW (ref >60)
Glucose, Bld: 105 mg/dL — ABNORMAL HIGH (ref 70–99)
Potassium: 4.4 mmol/L (ref 3.5–5.1)
Sodium: 142 mmol/L (ref 135–145)
Total Bilirubin: 0.5 mg/dL (ref 0.0–1.2)
Total Protein: 6.4 g/dL — ABNORMAL LOW (ref 6.5–8.1)

## 2023-03-29 LAB — RESPIRATORY PANEL BY PCR

## 2023-03-29 LAB — RESP PANEL BY RT-PCR (RSV, FLU A&B, COVID)  RVPGX2
Influenza A by PCR: NEGATIVE
Influenza B by PCR: NEGATIVE
Resp Syncytial Virus by PCR: NEGATIVE
SARS Coronavirus 2 by RT PCR: NEGATIVE

## 2023-03-29 LAB — URINALYSIS, ROUTINE W REFLEX MICROSCOPIC
Bilirubin Urine: NEGATIVE
Glucose, UA: NEGATIVE mg/dL
Hgb urine dipstick: NEGATIVE
Ketones, ur: NEGATIVE mg/dL
Leukocytes,Ua: NEGATIVE
Nitrite: NEGATIVE
Protein, ur: NEGATIVE mg/dL
Specific Gravity, Urine: 1.014 (ref 1.005–1.030)
pH: 5 (ref 5.0–8.0)

## 2023-03-29 LAB — MRSA NEXT GEN BY PCR, NASAL: MRSA by PCR Next Gen: NOT DETECTED

## 2023-03-29 LAB — LIPASE, BLOOD: Lipase: 24 U/L (ref 11–51)

## 2023-03-29 LAB — GLUCOSE, CAPILLARY: Glucose-Capillary: 148 mg/dL — ABNORMAL HIGH (ref 70–99)

## 2023-03-29 LAB — PROCALCITONIN: Procalcitonin: 0.37 ng/mL

## 2023-03-29 LAB — I-STAT CG4 LACTIC ACID, ED
Lactic Acid, Venous: 1.4 mmol/L (ref 0.5–1.9)
Lactic Acid, Venous: 0.7 mmol/L (ref 0.5–1.9)

## 2023-03-29 LAB — POC OCCULT BLOOD, ED: Fecal Occult Bld: NEGATIVE

## 2023-03-29 MED ORDER — METRONIDAZOLE 500 MG/100ML IV SOLN
500.0000 mg | Freq: Two times a day (BID) | INTRAVENOUS | Status: DC
  Administered 2023-03-29: 500 mg via INTRAVENOUS
  Filled 2023-03-29 (×2): qty 100

## 2023-03-29 MED ORDER — TRAZODONE HCL 50 MG PO TABS
100.0000 mg | ORAL_TABLET | Freq: Every day | ORAL | Status: DC
Start: 2023-03-29 — End: 2023-04-01
  Administered 2023-03-29 – 2023-03-31 (×3): 100 mg via ORAL
  Filled 2023-03-29 (×3): qty 2

## 2023-03-29 MED ORDER — ARFORMOTEROL TARTRATE 15 MCG/2ML IN NEBU
15.0000 ug | INHALATION_SOLUTION | Freq: Two times a day (BID) | RESPIRATORY_TRACT | Status: DC
  Administered 2023-03-29 – 2023-04-01 (×6): 15 ug via RESPIRATORY_TRACT
  Filled 2023-03-29 (×6): qty 2

## 2023-03-29 MED ORDER — VANCOMYCIN VARIABLE DOSE PER UNSTABLE RENAL FUNCTION (PHARMACIST DOSING)
Status: DC
Start: 2023-03-29 — End: 2023-03-30

## 2023-03-29 MED ORDER — DULOXETINE HCL 20 MG PO CPEP
20.0000 mg | ORAL_CAPSULE | Freq: Every day | ORAL | Status: DC
  Administered 2023-03-30 – 2023-04-01 (×3): 20 mg via ORAL
  Filled 2023-03-29 (×3): qty 1

## 2023-03-29 MED ORDER — AZELASTINE HCL 0.1 % NA SOLN
2.0000 | Freq: Every day | NASAL | Status: DC
  Administered 2023-03-30 – 2023-03-31 (×2): 2 via NASAL
  Filled 2023-03-29: qty 30

## 2023-03-29 MED ORDER — MIDODRINE HCL 5 MG PO TABS
10.0000 mg | ORAL_TABLET | Freq: Three times a day (TID) | ORAL | Status: DC
  Administered 2023-03-29: 10 mg via ORAL
  Filled 2023-03-29: qty 2

## 2023-03-29 MED ORDER — ACETAMINOPHEN 650 MG RE SUPP: 650.0000 mg | Freq: Four times a day (QID) | RECTAL | Status: DC | PRN

## 2023-03-29 MED ORDER — SODIUM CHLORIDE 0.9 % IV SOLN
2.0000 g | Freq: Once | INTRAVENOUS | Status: AC
  Administered 2023-03-29: 2 g via INTRAVENOUS
  Filled 2023-03-29: qty 20

## 2023-03-29 MED ORDER — FOLIC ACID 1 MG PO TABS
1.0000 mg | ORAL_TABLET | Freq: Every day | ORAL | Status: DC
  Administered 2023-03-30 – 2023-04-01 (×3): 1 mg via ORAL
  Filled 2023-03-29 (×3): qty 1

## 2023-03-29 MED ORDER — IPRATROPIUM-ALBUTEROL 0.5-2.5 (3) MG/3ML IN SOLN
3.0000 mL | Freq: Three times a day (TID) | RESPIRATORY_TRACT | Status: DC
  Administered 2023-03-30 – 2023-04-01 (×7): 3 mL via RESPIRATORY_TRACT
  Filled 2023-03-29 (×7): qty 3

## 2023-03-29 MED ORDER — CLOPIDOGREL BISULFATE 75 MG PO TABS
75.0000 mg | ORAL_TABLET | Freq: Every day | ORAL | Status: DC
  Administered 2023-03-30 – 2023-04-01 (×3): 75 mg via ORAL
  Filled 2023-03-29 (×3): qty 1

## 2023-03-29 MED ORDER — BUDESONIDE 0.25 MG/2ML IN SUSP
0.2500 mg | Freq: Two times a day (BID) | RESPIRATORY_TRACT | Status: DC
  Administered 2023-03-29 – 2023-04-01 (×6): 0.25 mg via RESPIRATORY_TRACT
  Filled 2023-03-29 (×6): qty 2

## 2023-03-29 MED ORDER — IPRATROPIUM-ALBUTEROL 0.5-2.5 (3) MG/3ML IN SOLN
3.0000 mL | Freq: Once | RESPIRATORY_TRACT | Status: AC
  Administered 2023-03-29: 3 mL via RESPIRATORY_TRACT
  Filled 2023-03-29: qty 3

## 2023-03-29 MED ORDER — SODIUM CHLORIDE 0.9 % IV BOLUS: 500.0000 mL | Freq: Once | INTRAVENOUS | Status: DC

## 2023-03-29 MED ORDER — LORATADINE 10 MG PO TABS
10.0000 mg | ORAL_TABLET | Freq: Every day | ORAL | Status: DC
  Administered 2023-03-30 – 2023-04-01 (×3): 10 mg via ORAL
  Filled 2023-03-29 (×3): qty 1

## 2023-03-29 MED ORDER — ACETAMINOPHEN 325 MG PO TABS
650.0000 mg | ORAL_TABLET | Freq: Four times a day (QID) | ORAL | Status: DC | PRN
  Administered 2023-03-31: 650 mg via ORAL
  Filled 2023-03-29: qty 2

## 2023-03-29 MED ORDER — RIVAROXABAN 20 MG PO TABS
20.0000 mg | ORAL_TABLET | Freq: Every day | ORAL | Status: DC
  Administered 2023-03-30 – 2023-04-01 (×3): 20 mg via ORAL
  Filled 2023-03-29 (×3): qty 1

## 2023-03-29 MED ORDER — POLYETHYLENE GLYCOL 3350 17 G PO PACK: 17.0000 g | PACK | Freq: Every day | ORAL | Status: DC | PRN

## 2023-03-29 MED ORDER — INSULIN ASPART 100 UNIT/ML IJ SOLN
0.0000 [IU] | Freq: Three times a day (TID) | INTRAMUSCULAR | Status: DC
  Administered 2023-03-30: 3 [IU] via SUBCUTANEOUS
  Administered 2023-03-30: 1 [IU] via SUBCUTANEOUS
  Administered 2023-03-30: 3 [IU] via SUBCUTANEOUS
  Administered 2023-03-31: 2 [IU] via SUBCUTANEOUS

## 2023-03-29 MED ORDER — ONDANSETRON HCL 4 MG/2ML IJ SOLN: 4.0000 mg | Freq: Four times a day (QID) | INTRAMUSCULAR | Status: DC | PRN

## 2023-03-29 MED ORDER — ALLOPURINOL 100 MG PO TABS
100.0000 mg | ORAL_TABLET | Freq: Every day | ORAL | Status: DC
  Administered 2023-03-30: 100 mg via ORAL
  Filled 2023-03-29: qty 1

## 2023-03-29 MED ORDER — SODIUM CHLORIDE 0.9 % IV SOLN
500.0000 mg | Freq: Once | INTRAVENOUS | Status: AC
  Administered 2023-03-29: 500 mg via INTRAVENOUS
  Filled 2023-03-29: qty 5

## 2023-03-29 MED ORDER — PANTOPRAZOLE SODIUM 40 MG PO TBEC
40.0000 mg | DELAYED_RELEASE_TABLET | Freq: Every day | ORAL | Status: DC
  Administered 2023-03-30 – 2023-04-01 (×3): 40 mg via ORAL
  Filled 2023-03-29 (×3): qty 1

## 2023-03-29 MED ORDER — ONDANSETRON HCL 4 MG PO TABS: 4.0000 mg | ORAL_TABLET | Freq: Four times a day (QID) | ORAL | Status: DC | PRN

## 2023-03-29 MED ORDER — GUAIFENESIN ER 600 MG PO TB12
600.0000 mg | ORAL_TABLET | Freq: Two times a day (BID) | ORAL | Status: DC
  Administered 2023-03-29 – 2023-04-01 (×6): 600 mg via ORAL
  Filled 2023-03-29 (×6): qty 1

## 2023-03-29 MED ORDER — SODIUM CHLORIDE 0.9 % IV SOLN
2.0000 g | INTRAVENOUS | Status: DC
  Administered 2023-03-29: 2 g via INTRAVENOUS
  Filled 2023-03-29: qty 12.5

## 2023-03-29 MED ORDER — SODIUM CHLORIDE 0.9 % IV SOLN: INTRAVENOUS | Status: DC

## 2023-03-29 MED ORDER — SODIUM CHLORIDE 0.9 % IV BOLUS
1000.0000 mL | Freq: Once | INTRAVENOUS | Status: AC
  Administered 2023-03-29: 1000 mL via INTRAVENOUS

## 2023-03-29 MED ORDER — REVEFENACIN 175 MCG/3ML IN SOLN
175.0000 ug | Freq: Every day | RESPIRATORY_TRACT | Status: DC
Start: 2023-03-29 — End: 2023-04-01
  Administered 2023-03-30 – 2023-04-01 (×3): 175 ug via RESPIRATORY_TRACT
  Filled 2023-03-29 (×3): qty 3

## 2023-03-29 MED ORDER — VANCOMYCIN HCL 1250 MG/250ML IV SOLN
1250.0000 mg | Freq: Once | INTRAVENOUS | Status: AC
  Administered 2023-03-29: 1250 mg via INTRAVENOUS
  Filled 2023-03-29: qty 250

## 2023-03-29 MED ORDER — HYDROCORTISONE SOD SUC (PF) 100 MG IJ SOLR
100.0000 mg | Freq: Three times a day (TID) | INTRAMUSCULAR | Status: DC
  Administered 2023-03-29 (×2): 100 mg via INTRAVENOUS
  Filled 2023-03-29 (×2): qty 2

## 2023-03-29 NOTE — Plan of Care (Signed)

## 2023-03-29 NOTE — H&P (Signed)
 History and Physical    Patient: Ebony Scott FMW:[id number] DOB: September 29, 1950 DOA: 03/29/2023 DOS: the patient was seen and examined on 03/29/2023 PCP: [name], MD  Patient coming from: Home  Chief Complaint:  Chief Complaint  Patient presents with   Weakness   HPI: Ebony Scott is a 73 y.o. 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  Per history obtained-for the past 4-5 days-patient has had intermittent fever associated with nausea, vomiting and cough.  She has gradually developed shortness of breath-that is mostly exertional.  Weakness is generalized and not focal.  Vomiting is bilious-and not bloody.  She was seen by her primary care practitioner on 2/4-influenza PCR was negative-she was given Zithromax  and sent home.  She unfortunately did not get better-hence she called EMS to her house today-Per EMS-blood pressure was 60/30 when they arrived along with a O2 saturation of 88 on room air.  She was given IV fluid boluses and brought to the ED.  Upon further evaluation in the ED-she was found to have sepsis physiology likely secondary to pneumonia along with AKI.  Patient was given additional IV fluid boluses-subsequently her blood pressure normalized-she was given antibiotics-and subsequently the hospitalist service was asked to admit this patient for further evaluation and treatment.   + Subjective fever + Intermittent headaches + Cough + Mild exertional shortness of breath + Nausea/vomiting  No abdominal pain No diarrhea No hematuria/dysuria   Past Surgical History:  Procedure Laterality Date   ABDOMINAL HYSTERECTOMY N/A    Phreesia 10/21/2019   ANTERIOR AND POSTERIOR REPAIR  1990's   APPENDECTOMY     ARGON LASER APPLICATION   02/05/2021   Procedure: ARGON LASER APPLICATION;  Surgeon: Leigh Elspeth SQUIBB, MD;  Location: [facility];  Service: Gastroenterology;;   BREAST LUMPECTOMY  1998   left   BREAST SURGERY N/A    Phreesia 10/21/2019   BRONCHIAL WASHINGS  04/05/2020   Procedure: BRONCHIAL WASHINGS;  Surgeon: Kara Dorn NOVAK, MD;  Location: [facility];  Service: Pulmonary;;   CAPSULOTOMY Right 08/28/2021   Procedure: CAPSULOTOMY;  Surgeon: Tobie Franky SQUIBB, DPM;  Location: [facility];  Service: Podiatry;  Laterality: Right;   CARPOMETACARPEL (CMC) FUSION OF THUMB WITH AUTOGRAFT FROM RADIUS  ~ 2009   both thumbs (02/20/2012)   CATARACT EXTRACTION W/ INTRAOCULAR LENS  IMPLANT, BILATERAL  2012   CERVICAL DISCECTOMY  10/2001   C5-C6   CERVICAL FUSION  2003   C3-C4   CHOLECYSTECTOMY     COLONOSCOPY     CORONARY STENT INTERVENTION N/A 01/11/2021   Procedure: CORONARY STENT INTERVENTION;  Surgeon: Anner Alm ORN, MD;  Location: [facility] INVASIVE CV LAB;  Service: Cardiovascular;  Laterality: N/A;   DEBRIDEMENT TENNIS ELBOW  ?1970's   right   ESOPHAGOGASTRODUODENOSCOPY     ESOPHAGOGASTRODUODENOSCOPY (EGD) WITH PROPOFOL  N/A 02/05/2021   Procedure: ESOPHAGOGASTRODUODENOSCOPY (EGD) WITH PROPOFOL ;  Surgeon: Leigh Elspeth SQUIBB, MD;  Location: [facility];  Service: Gastroenterology;  Laterality: N/A;   EYE SURGERY N/A    Phreesia 10/21/2019   HAMMER TOE SURGERY Right 08/28/2021   Procedure: HAMMER TOE REPAIR 2ND TOE RIGHT FOOT;  Surgeon: Tobie Franky SQUIBB, DPM;  Location: [facility];  Service: Podiatry;  Laterality: Right;   HYSTERECTOMY     I & D EXTREMITY Right 09/22/2020  Procedure: IRRIGATION AND DEBRIDEMENT RIGHT HAND AND ELBOW;  Surgeon: Josefina Chew, MD;  Location: [facility];  Service: Orthopedics;  Laterality: Right;   ICD IMPLANT N/A 08/27/2022   Procedure: ICD IMPLANT;  Surgeon: Nancey Eulas BRAVO, MD;  Location: [facility];  Service: Cardiovascular;  Laterality: N/A;   KNEE ARTHROPLASTY  ?8009'd   ?right;  w/cartilage repair (02/20/2012)   MASS EXCISION Left 08/28/2021   Procedure: EXCISION OF SOFT TISSUE  MASS LEFT FOOT;  Surgeon: Tobie Franky SQUIBB, DPM;  Location: [facility];  Service: Podiatry;  Laterality: Left;   NASAL SEPTUM SURGERY  1980's   POSTERIOR CERVICAL FUSION/FORAMINOTOMY  2004   failed initial fusion; rewired  anterior neck (02/20/2012)   REVERSE SHOULDER ARTHROPLASTY Right 02/16/2020   Procedure: REVERSE SHOULDER ARTHROPLASTY;  Surgeon: Josefina Chew, MD;  Location: [facility];  Service: Orthopedics;  Laterality: Right;   RIGHT/LEFT HEART CATH AND CORONARY ANGIOGRAPHY N/A 01/11/2021   Procedure: RIGHT/LEFT HEART CATH AND CORONARY ANGIOGRAPHY;  Surgeon: Anner Alm ORN, MD;  Location: [facility] INVASIVE CV LAB;  Service: Cardiovascular;  Laterality: N/A;   SPINE SURGERY N/A    Phreesia 10/21/2019   TONSILLECTOMY  ~ 1953   VESICOVAGINAL FISTULA CLOSURE W/ TAH  1988   VIDEO BRONCHOSCOPY Bilateral 08/23/2016   Procedure: VIDEO BRONCHOSCOPY WITH FLUORO;  Surgeon: Noreen Tonnie BRAVO, MD;  Location: [facility];  Service: Cardiopulmonary;  Laterality: Bilateral;   VIDEO BRONCHOSCOPY N/A 04/05/2020   Procedure: VIDEO BRONCHOSCOPY WITHOUT FLUORO;  Surgeon: Kara Dorn NOVAK, MD;  Location: [facility];  Service: Pulmonary;  Laterality: N/A;   Social History:  reports that she has never smoked. She has been exposed to tobacco smoke. She has never used smokeless tobacco. She reports that she does not currently use alcohol . She reports that she does not use drugs.  Allergies  Allergen Reactions   Baclofen  Anaphylaxis and Other (See Comments)    Altered mental status requiring a 3-day hospital stay- patient became unresponsive   Put me in a coma for five days, Altered mental status requiring a 3-day hospital stay- patient became unresponsive   Other Shortness Of Breath and Other (See Comments)    Grass and weeds sneezing; filled sinuses (02/20/2012)   Pork-Derived Products Anaphylaxis   Shellfish  Allergy  Anaphylaxis, Shortness Of Breath, Itching and Rash   Shrimp Extract Anaphylaxis and Other (See Comments)    ALL SHELLFISH   Tetracycline Hcl Nausea And Vomiting   Xolair  [Omalizumab ] Other (See Comments)    Caused Blood clot   Zoledronic  Acid Other (See Comments)    Reclast - Fever, Put in hospital, dr said it was a reaction from a reaction    Fever, inability to walk, Put in hospital  Fever, Put in hospital, dr said it was a reaction from a reaction , Other reaction(s): Unknown   Celecoxib Swelling    Feet swelling   Dilaudid  [Hydromorphone  Hcl] Itching   Hydrocodone -Acetaminophen  Itching   Levofloxacin  Other (See Comments)    GI upset   Molds & Smuts Cough   Morphine  And Codeine Hives and Itching   Oxycodone  Hcl Nausea And Vomiting    GI Intolerance   Paroxetine Nausea And Vomiting and Other (See Comments)    GI Intolerance also   Penicillins Rash and Other (See Comments)    welts (02/20/2012)  Tolerated Ancef  02/16/20   Tetracycline Hcl Other (See Comments)    GI Intolerance   Oxycodone -Acetaminophen  Itching   Diltiazem  Hcl Swelling   Dust Mite Extract Other (See Comments)  and Cough    sneezing (02/20/2012) too   Gamunex [Immune Globulin ] Itching and Rash    Tolerates hizentra     Lactose Other (See Comments)    Bloating and gas   Penicillin G Procaine Rash and Other (See Comments)    Welts, also   Rituximab  Other (See Comments)    Anemia    Rofecoxib Swelling    Vioxx- feet swelling   Tree Extract Itching    tested and told I was allergic to it; never experienced a reaction to it (02/20/2012)    Family History  Problem Relation Age of Onset   Allergies Mother    Heart disease Mother    Arthritis Mother    Lung cancer Mother        heavy smoker   Diabetes Mother    Allergies Father    Heart disease Father    Arthritis Father    Stroke Father    Heart disease Brother    Diabetes Maternal Grandfather    Colitis Daughter    Colon cancer Other         Maternal half uncle    Prior to Admission medications   Medication Sig Start Date End Date Taking? Authorizing Provider  acetaminophen  (TYLENOL ) 500 MG tablet Take 1,000 mg by mouth as needed for moderate pain.    [provider]  albuterol  (PROVENTIL ) (2.5 MG/3ML) 0.083% nebulizer solution Take 3 mLs (2.5 mg total) by nebulization every 4 (four) hours as needed for wheezing or shortness of breath. 01/01/23   Kozlow, Camellia PARAS, MD  albuterol  (VENTOLIN  HFA) 108 (90 Base) MCG/ACT inhaler Inhale 2 puffs into the lungs every 6 (six) hours as needed for wheezing or shortness of breath. 01/01/23   Kozlow, Camellia PARAS, MD  allopurinol  (ZYLOPRIM ) 100 MG tablet Take 1 tablet (100 mg total) by mouth daily. 03/04/23   [name], MD  Alpha-D-Galactosidase (BEANO PO) Take 2-3 tablets by mouth 3 (three) times daily as needed (Gas).    [provider]  Alpha-Lipoic Acid 600 MG TABS Take 600 mg by mouth in the morning.    [provider]  atorvastatin  (LIPITOR) 80 MG tablet Take 1 tablet (80 mg total) by mouth every evening. 03/04/23   [name], MD  azelastine  (ASTELIN ) 0.1 % nasal spray Place 2 sprays into both nostrils daily. Use in each nostril as directed Patient taking differently: Place 2 sprays into both nostrils at bedtime as needed for allergies or rhinitis. Use in each nostril as directed 06/13/21   Kozlow, Camellia PARAS, MD  azithromycin  (ZITHROMAX  Z-PAK) 250 MG tablet Take 2 tablets (500 mg) PO today, then 1 tablet (250 mg) PO daily x4 days. 03/26/23   Aletha Bene, MD  cetirizine  (ZYRTEC ) 10 MG tablet Take 1 tablet (10 mg total) by mouth 2 (two) times daily as needed for allergies (Can take a n extra dose during flare ups.). Patient taking differently: Take 10 mg by mouth at bedtime. 06/13/21   Kozlow, Camellia PARAS, MD  Cholecalciferol  (VITAMIN D3) 25 MCG (1000 UT) capsule Take 1,000 Units by mouth at bedtime.    [provider]  clopidogrel  (PLAVIX ) 75 MG tablet Take 1  tablet (75 mg total) by mouth daily. 03/21/23   Lonni Slain, MD  cyproheptadine  (PERIACTIN ) 4 MG tablet Take 3 tablets (12 mg total) by mouth at bedtime. 01/01/23   Kozlow, Camellia PARAS, MD  denosumab  (PROLIA ) 60 MG/ML SOSY injection Inject 60 mg into the skin every 6 (six) months.  [provider]  dexlansoprazole  (DEXILANT ) 60 MG capsule Take 1 capsule (60 mg total) by mouth in the morning. 01/01/23   Kozlow, Camellia PARAS, MD  diclofenac  Sodium (VOLTAREN ) 1 % GEL Apply 1 Application topically as needed (pain).    [provider]  DULoxetine  (CYMBALTA ) 20 MG capsule Take 1 capsule (20 mg total) by mouth daily. 12/11/22   Jeannetta Lonni ORN, MD  empagliflozin  (JARDIANCE ) 10 MG TABS tablet Take 1 tablet (10 mg total) by mouth daily. 02/07/23   [name], Warren T, MD  EPINEPHRINE  0.3 mg/0.3 mL IJ SOAJ injection USE AS DIRECTED BY YOUR PHYSICIAN INTRAMUSCULARLY AS NEEDED FOR ANAPHYLAXIS 10/09/22   Kozlow, Camellia PARAS, MD  famotidine  (PEPCID ) 20 MG tablet Take 1 tablet (20 mg total) by mouth every morning. TAKE 1 TABLET BY MOUTH EACH NIGHT AT BEDTIME Strength: 20 mg 01/01/23   Kozlow, Camellia PARAS, MD  Fluticasone -Umeclidin-Vilant (TRELEGY ELLIPTA ) 200-62.5-25 MCG/ACT AEPB Inhale 1 Inhalation into the lungs in the morning. 01/01/23   Kozlow, Camellia PARAS, MD  Fluticasone -Umeclidin-Vilant (TRELEGY ELLIPTA ) 200-62.5-25 MCG/ACT AEPB 1 inhalation 1 time per day. 03/08/23   Kozlow, Camellia PARAS, MD  folic acid  (FOLVITE ) 1 MG tablet Take 1 tablet (1 mg total) by mouth daily. 03/04/23   [name], MD  furosemide  (LASIX ) 40 MG tablet Take 40 mg by mouth daily. 01/18/23   [provider]  gabapentin  (NEURONTIN ) 300 MG capsule Take 1 capsule (300 mg total) by mouth at bedtime. 05/04/22   Patsy Lenis, MD  guaiFENesin  (MUCINEX ) 600 MG 12 hr tablet Take 600 mg by mouth 2 (two) times daily.    [provider]  HIZENTRA  10 GM/50ML SOLN Inject 15 g into the skin once a week. 13 g Infusion 01/14/23    Kozlow, Camellia PARAS, MD  ipratropium-albuterol  (DUONEB) 0.5-2.5 (3) MG/3ML SOLN Take 3 mLs by nebulization every 6 (six) hours as needed. 11/14/21   Kara Dorn NOVAK, MD  isosorbide -hydrALAZINE  (BIDIL ) 20-37.5 MG tablet Take 1 tablet by mouth 2 (two) times daily. 03/21/23   Lonni Slain, MD  Lactase (LACTOSE FAST ACTING RELIEF PO) Take 2-3 tablets by mouth 3 (three) times daily before meals. For dairy food or drink    [provider]  magnesium  oxide (MAG-OX) 400 (240 Mg) MG tablet Take 400 mg by mouth at bedtime.    [provider]  montelukast  (SINGULAIR ) 10 MG tablet Take 1 tablet (10 mg total) by mouth daily after supper. 03/04/23   [name], MD  mupirocin  ointment (BACTROBAN ) 2 % Apply 1 Application topically as needed (wounds). 01/29/23   Kozlow, Camellia PARAS, MD  nystatin  (MYCOSTATIN ) 100000 UNIT/ML suspension 5 mls swish, gargle, swallow after Trelegy use 03/14/23   Kozlow, Camellia PARAS, MD  potassium chloride  (KLOR-CON ) 10 MEQ tablet Take 10 mEq by mouth daily. 01/18/23   [provider]  predniSONE  (DELTASONE ) 1 MG tablet TAKE 2 TABLETS (2MG ) BY MOUTH ONCE DAILY WITH BREAKFAST. TAKE ALONG WITH 5MG  DAILY 01/25/23   Jeannetta Lonni ORN, MD  predniSONE  (DELTASONE ) 5 MG tablet TAKE 1 TABLET (5MG ) BY MOUTH ONCE DAILY WITH BREAKFAST 01/25/23   Rice, Lonni ORN, MD  Prenatal Vit-Fe Fumarate-FA (PRENATAL VITAMINS PO) Take 1 tablet by mouth in the morning.    [provider]  Probiotic Product (ALIGN) 4 MG CAPS Take 4 mg by mouth every evening.    [provider]  Respiratory Therapy Supplies (NEBULIZER MASK ADULT) MISC 1 Device by Does not apply route as directed. 01/01/23  Kozlow, Camellia PARAS, MD  rivaroxaban  (XARELTO ) 20 MG TABS tablet Take 1 tablet (20 mg total) by mouth daily with supper. 03/04/23   [name], MD  sacubitril -valsartan  (ENTRESTO ) 24-26 MG Take 1 tablet by mouth 2 (two) times daily. 03/21/23   Lonni Slain, MD  traMADol   (ULTRAM ) 50 MG tablet Take 1 tablet (50 mg total) by mouth at bedtime. 02/19/23   [name], MD  traZODone  (DESYREL ) 50 MG tablet Take 2 tablets (100 mg total) by mouth at bedtime. 02/01/23   [name], MD    Physical Exam: Vitals:   03/29/23 1532 03/29/23 1545 03/29/23 1600 03/29/23 1628  BP: (!) 99/52 (!) 123/49 111/74   Pulse: 86 84 85   Resp: (!) 23 20 (!) 26   Temp: 97.8 F (36.6 C)     TempSrc: Oral     SpO2: 93% 96% 94%   Weight:    58.6 kg  Height:    5' (1.524 m)    Gen Exam:Alert awake-not in any distress HEENT:atraumatic, normocephalic Chest: B/L clear to auscultation anteriorly CVS:S1S2 regular Abdomen:soft non tender, non distended Extremities:no edema Neurology: Non focal Skin: no rash  Data Reviewed:     Latest Ref Rng & Units 03/29/2023   11:01 AM 03/26/2023   12:33 PM 12/06/2022    5:25 AM  CBC  WBC 4.0 - 10.5 K/uL 9.9  15.2  9.4   Hemoglobin 12.0 - 15.0 g/dL 9.3  88.6  89.5   Hematocrit 36.0 - 46.0 % 30.2  34.8  33.0   Platelets 150 - 400 K/uL 171  196  193      BMET    Component Value Date/Time   NA 142 03/29/2023 1101   NA 143 08/20/2022 1044   K 4.4 03/29/2023 1101   CL 110 03/29/2023 1101   CO2 21 (L) 03/29/2023 1101   GLUCOSE 105 (H) 03/29/2023 1101   BUN 45 (H) 03/29/2023 1101   BUN 33 (H) 08/20/2022 1044   CREATININE 3.12 (H) 03/29/2023 1101   CREATININE 1.90 (H) 03/26/2023 1233   CALCIUM  8.7 (L) 03/29/2023 1101   EGFR 27 (L) 03/26/2023 1233   EGFR 38 (L) 08/20/2022 1044   GFRNONAA 15 (L) 03/29/2023 1101   GFRNONAA 51 (L) 06/14/2022 1012   GFRNONAA 34 (L) 08/25/2020 1435     Assessment and Plan: Severe sepsis likely secondary to PNA-?aspiration Hypotensive/AKI on presentation-sepsis physiology present on admission Blood pressure has stabilized after IV fluids/IV antibiotics IV fluids will be continued for another 24 hours Stress dose steroids as patient on chronic prednisone  for RA. Given immunocompromised status  (on prednisone /IVIG) will broaden antibiotics to vancomycin /cefepime /Flagyl  until further clinical stability/culture data is obtained. Monitor in progressive care unit-if hypotension recurs/persists-May need to consult PCCM. Follow culture data  AKI on CKD stage IIIa Suspect AKI hemodynamically mediated Avoid nephrotoxic agents Follow CT abdomen (pending read) to ensure no hydronephrosis/obstruction Repeat electrolytes tomorrow-if no improvement-initiate further workup.  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  History of RA Chronically on prednisone  7 mg  daily which will be resumed after patient completes several days of stress dose steroids.  Hypogammaglobinemia/chronic immune deficiency On IVIG treatments per immunology as an outpatient.  DM-2 (A1C 6.0 on Oct 2024) SSI   Advance Care Planning:   Code Status: Full Code   Consults: None  Family Communication: Daughter-[name] over the phone  Severity of Illness: The appropriate patient status for this patient is INPATIENT. Inpatient status is judged to be reasonable and necessary in order to provide the required intensity of service to ensure the patient's safety. The patient's presenting symptoms, physical exam findings, and initial radiographic and laboratory data in the context of their chronic comorbidities is felt to place them at high risk for further clinical deterioration. Furthermore, it is not anticipated that the patient  will be medically stable for discharge from the hospital within 2 midnights of admission.   * I certify that at the point of admission it is my clinical judgment that the patient will require inpatient hospital care spanning beyond 2 midnights from the point of admission due to high intensity of service, high risk for further deterioration and high frequency of surveillance required.*  Author: Donalda Applebaum, MD 03/29/2023 5:27 PM  For on call review [email or web address].

## 2023-03-29 NOTE — Sepsis Progress Note (Signed)
 eLink is following this Code Sepsis.

## 2023-03-29 NOTE — ED Triage Notes (Signed)
 Pt bib ems form home; c/o generalized weakness, productive cough, and nausea x 1 day, pt pale on ems arrival, BP 60/30; 500 bolus given, last bp 80/40; HR 80, wears 02 at night, sats 88% RA; hx chf, ckd; 18 ga lac

## 2023-03-29 NOTE — Progress Notes (Signed)
 Pharmacy Antibiotic Note  Ebony Scott is a 73 y.o. female for which pharmacy has been consulted for cefepime  and vancomycin  dosing for sepsis.  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 Patient presenting with weakness.  SCr 3.12 - AKI WBC 9.9; LA 0.7; T 98.6; HR 77; RR 22 COVID neg / flu neg  Plan: Cefepime  2g q24hr  Vancomycin  1250 mg once, subsequent dosing as indicated per random vancomycin  level until renal function stable and/or improved, at which time scheduled dosing can be considered Monitor WBC, fever, renal function, cultures De-escalate when able  Height: 5' (152.4 cm) Weight: 58.6 kg (129 lb 4 oz) IBW/kg (Calculated) : 45.5  Temp (24hrs), Avg:97.9 F (36.6 C), Min:97.8 F (36.6 C), Max:97.9 F (36.6 C)  Recent Labs  Lab 03/26/23 1233 03/29/23 1101 03/29/23 1114 03/29/23 1442  WBC 15.2* 9.9  --   --   CREATININE 1.90* 3.12*  --   --   LATICACIDVEN  --   --  1.4 0.7    Estimated Creatinine Clearance: 12.5 mL/min (A) (by C-G formula based on SCr of 3.12 mg/dL (H)).    Allergies  Allergen Reactions   Baclofen  Anaphylaxis and Other (See Comments)    Altered mental status requiring a 3-day hospital stay- patient became unresponsive   Put me in a coma for five days, Altered mental status requiring a 3-day hospital stay- patient became unresponsive   Other Shortness Of Breath and Other (See Comments)    Grass and weeds sneezing; filled sinuses (02/20/2012)   Pork-Derived Products Anaphylaxis   Shellfish Allergy  Anaphylaxis, Shortness Of Breath, Itching and Rash   Shrimp Extract Anaphylaxis and Other (See Comments)    ALL SHELLFISH   Tetracycline Hcl Nausea And Vomiting   Xolair  [Omalizumab ] Other (See Comments)    Caused Blood clot   Zoledronic  Acid Other (See Comments)    Reclast - Fever, Put in hospital, dr said it was a reaction from a reaction    Fever, inability to walk, Put in  hospital  Fever, Put in hospital, dr said it was a reaction from a reaction , Other reaction(s): Unknown   Celecoxib Swelling    Feet swelling   Dilaudid  [Hydromorphone  Hcl] Itching   Hydrocodone -Acetaminophen  Itching   Levofloxacin  Other (See Comments)    GI upset   Molds & Smuts Cough   Morphine  And Codeine Hives and Itching   Oxycodone  Hcl Nausea And Vomiting    GI Intolerance   Paroxetine Nausea And Vomiting and Other (See Comments)    GI Intolerance also   Penicillins Rash and Other (See Comments)    welts (02/20/2012)  Tolerated Ancef  02/16/20   Tetracycline Hcl Other (See Comments)    GI Intolerance   Oxycodone -Acetaminophen  Itching   Diltiazem  Hcl Swelling   Dust Mite Extract Other (See Comments) and Cough    sneezing (02/20/2012) too   Gamunex [Immune Globulin ] Itching and Rash    Tolerates hizentra     Lactose Other (See Comments)    Bloating and gas   Penicillin G Procaine Rash and Other (See Comments)    Welts, also   Rituximab  Other (See Comments)    Anemia    Rofecoxib Swelling    Vioxx- feet swelling   Tree Extract Itching    tested and told I was allergic to it; never experienced a reaction to it (02/20/2012)   Microbiology results: Pending  Thank you for allowing pharmacy to be a part  of this patient's care.  Dorn Buttner, PharmD, BCPS 03/29/2023 4:49 PM ED Clinical Pharmacist -  [phone]

## 2023-03-29 NOTE — ED Notes (Signed)
 Py given food and beverage; okay to eat and drink per Dr. Scarlette Currier

## 2023-03-29 NOTE — ED Notes (Addendum)
 Micro called to add on 20 pathogen

## 2023-03-29 NOTE — Progress Notes (Signed)
 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  Allergies  Allergen Reactions   Baclofen  Anaphylaxis and Other (See Comments)    Altered mental status requiring a 3-day hospital stay- patient became unresponsive   Put me in a coma for five days, Altered mental status requiring a 3-day hospital stay- patient became unresponsive   Other Shortness Of Breath and Other (See Comments)    Grass and weeds sneezing; filled sinuses (02/20/2012)   Pork-Derived Products Anaphylaxis   Shellfish Allergy  Anaphylaxis, Shortness Of Breath, Itching and Rash   Shrimp Extract Anaphylaxis and Other (See Comments)    ALL SHELLFISH   Tetracycline Hcl Nausea And Vomiting   Xolair  [Omalizumab ] Other (See Comments)    Caused Blood clot   Zoledronic  Acid Other (See Comments)    Reclast - Fever, Put in hospital, dr said it was a reaction from a reaction    Fever, inability to walk, Put in hospital  Fever, Put in hospital, dr said it was a reaction from a reaction , Other reaction(s): Unknown   Celecoxib Swelling    Feet swelling   Dilaudid  [Hydromorphone  Hcl] Itching   Hydrocodone -Acetaminophen  Itching   Levofloxacin  Other (See Comments)    GI upset   Molds & Smuts Cough   Morphine  And Codeine Hives and Itching   Oxycodone  Hcl Nausea And Vomiting    GI Intolerance   Paroxetine Nausea And Vomiting and Other (See Comments)    GI Intolerance also   Penicillins Rash and Other (See Comments)    welts (02/20/2012)  Tolerated Ancef  02/16/20   Tetracycline Hcl Other (See Comments)    GI Intolerance   Oxycodone -Acetaminophen  Itching   Diltiazem  Hcl Swelling   Dust Mite Extract Other (See Comments) and Cough    sneezing (02/20/2012) too   Gamunex [Immune Globulin ] Itching and Rash    Tolerates hizentra     Penicillin G Procaine Rash and Other (See Comments)    Welts, also   Rituximab  Other (See Comments)    Anemia    Rofecoxib  Swelling    Vioxx- feet swelling   Tree Extract Itching    tested and told I was allergic to it; never experienced a reaction to it (02/20/2012)    Patient Measurements: Height: 5' (152.4 cm) Weight: 58.6 kg (129 lb 4 oz) IBW/kg (Calculated) : 45.5  Vital Signs: Temp: 98.6 F (37 C) (02/07 1800) Temp Source: Axillary (02/07 1800) BP: 117/55 (02/07 1800) Pulse Rate: 77 (02/07 1800)  Labs: Recent Labs    03/29/23 1101  HGB 9.3*  HCT 30.2*  PLT 171  CREATININE 3.12*    Estimated Creatinine Clearance: 12.5 mL/min (A) (by C-G formula based on SCr of 3.12 mg/dL (H)).   Medical History: Past Medical History:  Diagnosis Date   Anemia    Angio-edema    Breast cancer (HCC) 1998   Left breast, in remission   Bronchiectasis (HCC)    CHF (congestive heart failure) (HCC)    Clostridium difficile colitis 01/14/2018   Coronary artery disease    Depression    some; don't take anything for it (02/20/2012), pt denies dx as of 06/02/21   Diverticulosis    DVT (deep venous thrombosis) (HCC)    Fibromyalgia 11/2011   GAVE (gastric antral vascular ectasia)    GERD (gastroesophageal reflux disease)    Headache(784.0)    related to allergies; more at different times during the year (02/20/2012)   Hemorrhoids  Hiatal hernia    back and neck   History of blood transfusion 2023   1 unit, 5 units of Iron    Hx of adenomatous colonic polyps 04/12/2016   Hypercholesteremia    good cholesterol is high   Hypothyroidism    h/o Graves disease   IBS (irritable bowel syndrome)    Moderate persistent asthma    -FeV1 72% 2011, -IgE 102 2011, CT sinus Neg 2011   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 Pharmacy consulted to ensure renal dosing for patient's home Xarelto  for recurrent PE.  Patient states last dose was 2/7am prior to arrival.  Estimated Creatinine Clearance: 12.5 mL/min (A) (by C-G formula based on SCr of 3.12 mg/dL (H)). - AKI  Hgb 9.3; plt 171  Goal of Therapy:  Monitor platelets by anticoagulation protocol: Yes   Plan:  Will tentatively plan to continue Xarelto  20mg  daily However, patient CrCl is borderline for renal cut off for recommended dosing. I am hopeful CrCl will be improved in the morning and dose can be resumed. If not, may have to consider alternate therapy. Monitor renal function, CBC, and for s/s of hemorrhage  Dorn Buttner, PharmD, BCPS 03/29/2023 6:54 PM ED Clinical Pharmacist -  [phone]

## 2023-03-29 NOTE — ED Provider Notes (Signed)
 [place] EMERGENCY DEPARTMENT AT [facility] Provider Note   CSN: [id number] Arrival date & time: 03/29/23  1056     History  Chief Complaint  Patient presents with   Weakness    Ebony Scott is a 73 y.o. female.  Pt is a 73 yo female with pmhx significant for asthma, GERD, hypothyroidism, IBS, fibromyalgia, breast cancer, DVT, hld, chf, and chronic resp failure (wears 2L oxygen  at home at night).  Pt has been weak since yesterday.  She's had n/v and no oral intake.  She has a cough.  O2 sat 88% RA, so EMS put her on 2L and oxygen  is up to the mid-90s.  She had a bp in the 60s upon EMS arrival, so she was given 500 cc NS bolus in route and bp up to the mid-80s.  Pt denies fevers.  No new pain.       Home Medications Prior to Admission medications   Medication Sig Start Date End Date Taking? Authorizing Provider  acetaminophen  (TYLENOL ) 500 MG tablet Take 1,000 mg by mouth as needed for moderate pain.    [provider]  albuterol  (PROVENTIL ) (2.5 MG/3ML) 0.083% nebulizer solution Take 3 mLs (2.5 mg total) by nebulization every 4 (four) hours as needed for wheezing or shortness of breath. 01/01/23   Kozlow, Camellia PARAS, MD  albuterol  (VENTOLIN  HFA) 108 (90 Base) MCG/ACT inhaler Inhale 2 puffs into the lungs every 6 (six) hours as needed for wheezing or shortness of breath. 01/01/23   Kozlow, Camellia PARAS, MD  allopurinol  (ZYLOPRIM ) 100 MG tablet Take 1 tablet (100 mg total) by mouth daily. 03/04/23   [name], MD  Alpha-D-Galactosidase (BEANO PO) Take 2-3 tablets by mouth 3 (three) times daily as needed (Gas).    [provider]  Alpha-Lipoic Acid 600 MG TABS Take 600 mg by mouth in the morning.    [provider]  atorvastatin  (LIPITOR) 80 MG tablet Take 1 tablet (80 mg total) by mouth every evening. 03/04/23   [name], MD  azelastine  (ASTELIN ) 0.1 % nasal spray Place 2 sprays into both nostrils daily. Use in each nostril as  directed Patient taking differently: Place 2 sprays into both nostrils at bedtime as needed for allergies or rhinitis. Use in each nostril as directed 06/13/21   Kozlow, Camellia PARAS, MD  azithromycin  (ZITHROMAX  Z-PAK) 250 MG tablet Take 2 tablets (500 mg) PO today, then 1 tablet (250 mg) PO daily x4 days. 03/26/23   Aletha Bene, MD  cetirizine  (ZYRTEC ) 10 MG tablet Take 1 tablet (10 mg total) by mouth 2 (two) times daily as needed for allergies (Can take a n extra dose during flare ups.). Patient taking differently: Take 10 mg by mouth at bedtime. 06/13/21   Kozlow, Camellia PARAS, MD  Cholecalciferol  (VITAMIN D3) 25 MCG (1000 UT) capsule Take 1,000 Units by mouth at bedtime.    [provider]  clopidogrel  (PLAVIX ) 75 MG tablet Take 1 tablet (75 mg total) by mouth daily. 03/21/23   Lonni Slain, MD  cyproheptadine  (PERIACTIN ) 4 MG tablet Take 3 tablets (12 mg total) by mouth at bedtime. 01/01/23   Kozlow, Camellia PARAS, MD  denosumab  (PROLIA ) 60 MG/ML SOSY injection Inject 60 mg into the skin every 6 (six) months.    [provider]  dexlansoprazole  (DEXILANT ) 60 MG capsule Take 1 capsule (60 mg total) by mouth in the morning. 01/01/23   Kozlow, Camellia PARAS, MD  diclofenac  Sodium (VOLTAREN ) 1 %  GEL Apply 1 Application topically as needed (pain).    [provider]  DULoxetine  (CYMBALTA ) 20 MG capsule Take 1 capsule (20 mg total) by mouth daily. 12/11/22   Rice, Lonni ORN, MD  empagliflozin  (JARDIANCE ) 10 MG TABS tablet Take 1 tablet (10 mg total) by mouth daily. 02/07/23   [name], Warren T, MD  EPINEPHRINE  0.3 mg/0.3 mL IJ SOAJ injection USE AS DIRECTED BY YOUR PHYSICIAN INTRAMUSCULARLY AS NEEDED FOR ANAPHYLAXIS 10/09/22   Kozlow, Camellia PARAS, MD  famotidine  (PEPCID ) 20 MG tablet Take 1 tablet (20 mg total) by mouth every morning. TAKE 1 TABLET BY MOUTH EACH NIGHT AT BEDTIME Strength: 20 mg 01/01/23   Kozlow, Camellia PARAS, MD  Fluticasone -Umeclidin-Vilant (TRELEGY ELLIPTA ) 200-62.5-25 MCG/ACT  AEPB Inhale 1 Inhalation into the lungs in the morning. 01/01/23   Kozlow, Camellia PARAS, MD  Fluticasone -Umeclidin-Vilant (TRELEGY ELLIPTA ) 200-62.5-25 MCG/ACT AEPB 1 inhalation 1 time per day. 03/08/23   Kozlow, Camellia PARAS, MD  folic acid  (FOLVITE ) 1 MG tablet Take 1 tablet (1 mg total) by mouth daily. 03/04/23   [name], MD  furosemide  (LASIX ) 40 MG tablet Take 40 mg by mouth daily. 01/18/23   [provider]  gabapentin  (NEURONTIN ) 300 MG capsule Take 1 capsule (300 mg total) by mouth at bedtime. 05/04/22   Patsy Lenis, MD  guaiFENesin  (MUCINEX ) 600 MG 12 hr tablet Take 600 mg by mouth 2 (two) times daily.    [provider]  HIZENTRA  10 GM/50ML SOLN Inject 15 g into the skin once a week. 13 g Infusion 01/14/23   Kozlow, Camellia PARAS, MD  ipratropium-albuterol  (DUONEB) 0.5-2.5 (3) MG/3ML SOLN Take 3 mLs by nebulization every 6 (six) hours as needed. 11/14/21   Kara Dorn NOVAK, MD  isosorbide -hydrALAZINE  (BIDIL ) 20-37.5 MG tablet Take 1 tablet by mouth 2 (two) times daily. 03/21/23   Lonni Slain, MD  Lactase (LACTOSE FAST ACTING RELIEF PO) Take 2-3 tablets by mouth 3 (three) times daily before meals. For dairy food or drink    [provider]  magnesium  oxide (MAG-OX) 400 (240 Mg) MG tablet Take 400 mg by mouth at bedtime.    [provider]  montelukast  (SINGULAIR ) 10 MG tablet Take 1 tablet (10 mg total) by mouth daily after supper. 03/04/23   [name], MD  mupirocin  ointment (BACTROBAN ) 2 % Apply 1 Application topically as needed (wounds). 01/29/23   Kozlow, Camellia PARAS, MD  nystatin  (MYCOSTATIN ) 100000 UNIT/ML suspension 5 mls swish, gargle, swallow after Trelegy use 03/14/23   Kozlow, Camellia PARAS, MD  potassium chloride  (KLOR-CON ) 10 MEQ tablet Take 10 mEq by mouth daily. 01/18/23   [provider]  predniSONE  (DELTASONE ) 1 MG tablet TAKE 2 TABLETS (2MG ) BY MOUTH ONCE DAILY WITH BREAKFAST. TAKE ALONG WITH 5MG  DAILY 01/25/23   Jeannetta Lonni ORN,  MD  predniSONE  (DELTASONE ) 5 MG tablet TAKE 1 TABLET (5MG ) BY MOUTH ONCE DAILY WITH BREAKFAST 01/25/23   Rice, Lonni ORN, MD  Prenatal Vit-Fe Fumarate-FA (PRENATAL VITAMINS PO) Take 1 tablet by mouth in the morning.    [provider]  Probiotic Product (ALIGN) 4 MG CAPS Take 4 mg by mouth every evening.    [provider]  Respiratory Therapy Supplies (NEBULIZER MASK ADULT) MISC 1 Device by Does not apply route as directed. 01/01/23   Kozlow, Camellia PARAS, MD  rivaroxaban  (XARELTO ) 20 MG TABS tablet Take 1 tablet (20 mg total) by mouth daily with supper. 03/04/23   [name], MD  sacubitril -valsartan  (ENTRESTO )  24-26 MG Take 1 tablet by mouth 2 (two) times daily. 03/21/23   Lonni Slain, MD  traMADol  (ULTRAM ) 50 MG tablet Take 1 tablet (50 mg total) by mouth at bedtime. 02/19/23   [name], MD  traZODone  (DESYREL ) 50 MG tablet Take 2 tablets (100 mg total) by mouth at bedtime. 02/01/23   [name], MD      Allergies    Baclofen , Other, Pork-derived products, Shellfish allergy , Shrimp extract, Tetracycline hcl, Xolair  [omalizumab ], Zoledronic  acid, Celecoxib, Dilaudid  [hydromorphone  hcl], Hydrocodone -acetaminophen , Levofloxacin , Molds & smuts, Morphine  and codeine, Oxycodone  hcl, Paroxetine, Penicillins, Tetracycline hcl, Oxycodone -acetaminophen , Diltiazem  hcl, Dust mite extract, Gamunex [immune globulin ], Lactose, Penicillin g procaine, Rituximab , Rofecoxib, and Tree extract    Review of Systems   Review of Systems  Respiratory:  Positive for cough.   Gastrointestinal:  Positive for nausea and vomiting.  All other systems reviewed and are negative.   Physical Exam Updated Vital Signs BP (!) 99/52   Pulse 86   Temp 97.8 F (36.6 C) (Oral)   Resp (!) 23   SpO2 93%  Physical Exam Vitals and nursing note reviewed.  Constitutional:      Appearance: She is ill-appearing.  HENT:     Head: Normocephalic and atraumatic.     Right Ear:  External ear normal.     Left Ear: External ear normal.     Nose: Nose normal.     Mouth/Throat:     Mouth: Mucous membranes are dry.     Pharynx: Oropharynx is clear.  Eyes:     Extraocular Movements: Extraocular movements intact.     Conjunctiva/sclera: Conjunctivae normal.     Pupils: Pupils are equal, round, and reactive to light.  Cardiovascular:     Rate and Rhythm: Normal rate and regular rhythm.     Pulses: Normal pulses.     Heart sounds: Normal heart sounds.  Pulmonary:     Effort: Pulmonary effort is normal.     Breath sounds: Wheezing present.  Abdominal:     General: Abdomen is flat. Bowel sounds are normal.     Palpations: Abdomen is soft.  Genitourinary:    Rectum: Guaiac result negative.  Musculoskeletal:        General: Normal range of motion.     Cervical back: Normal range of motion and neck supple.  Skin:    General: Skin is warm.     Capillary Refill: Capillary refill takes less than 2 seconds.  Neurological:     General: No focal deficit present.     Mental Status: She is alert and oriented to person, place, and time.  Psychiatric:        Mood and Affect: Mood normal.        Behavior: Behavior normal.     ED Results / Procedures / Treatments   Labs (all labs ordered are listed, but only abnormal results are displayed) Labs Reviewed  CBC WITH DIFFERENTIAL/PLATELET - Abnormal; Notable for the following components:      Result Value   RBC 2.75 (*)    Hemoglobin 9.3 (*)    HCT 30.2 (*)    MCV 109.8 (*)    RDW 16.7 (*)    Monocytes Absolute 1.5 (*)    All other components within normal limits  COMPREHENSIVE METABOLIC PANEL - Abnormal; Notable for the following components:   CO2 21 (*)    Glucose, Bld 105 (*)    BUN 45 (*)    Creatinine, Ser 3.12 (*)  Calcium  8.7 (*)    Total Protein 6.4 (*)    Albumin 2.7 (*)    GFR, Estimated 15 (*)    All other components within normal limits  RESP PANEL BY RT-PCR (RSV, FLU A&B, COVID)  [id number]   CULTURE, BLOOD (ROUTINE X 2)  CULTURE, BLOOD (ROUTINE X 2)  LIPASE, BLOOD  URINALYSIS, ROUTINE W REFLEX MICROSCOPIC  I-STAT CG4 LACTIC ACID , ED  POC OCCULT BLOOD, ED  I-STAT CG4 LACTIC ACID , ED    EKG EKG Interpretation Date/Time:  Friday March 29 2023 11:00:22 EST Ventricular Rate:  67 PR Interval:  147 QRS Duration:  93 QT Interval:  409 QTC Calculation: 432 R Axis:   64  Text Interpretation: Sinus rhythm Probable anteroseptal infarct, recent Lateral leads are also involved No significant change since last tracing Confirmed by Dean Clarity [phone]) on 03/29/2023 11:36:07 AM  Radiology DG Chest Portable 1 View Result Date: 03/29/2023 CLINICAL DATA:  Shortness of breath and generalized weakness with productive cough and nausea EXAM: PORTABLE CHEST 1 VIEW COMPARISON:  Radiograph 12/01/2022 FINDINGS: Stable cardiomediastinal silhouette given patient rotation. Elevated right hemidiaphragm. Bibasilar atelectasis or infiltrate. No pleural effusion or pneumothorax. Left chest wall ICD. Right reverse TSA. Cervical spine fusion hardware. IMPRESSION: Bibasilar atelectasis or infiltrates. Electronically Signed   By: Norman Gatlin M.D.   On: 03/29/2023 13:15    Procedures Procedures    Medications Ordered in ED Medications  cefTRIAXone  (ROCEPHIN ) 2 g in sodium chloride  0.9 % 100 mL IVPB (2 g Intravenous New Bag/Given 03/29/23 1534)  azithromycin  (ZITHROMAX ) 500 mg in sodium chloride  0.9 % 250 mL IVPB (has no administration in time range)  sodium chloride  0.9 % bolus 1,000 mL (0 mLs Intravenous Stopped 03/29/23 1442)  ipratropium-albuterol  (DUONEB) 0.5-2.5 (3) MG/3ML nebulizer solution 3 mL (3 mLs Nebulization Given 03/29/23 1244)  sodium chloride  0.9 % bolus 1,000 mL ( Intravenous Restarted 03/29/23 1442)    ED Course/ Medical Decision Making/ A&P                                 Medical Decision Making Amount and/or Complexity of Data Reviewed Labs: ordered. Radiology:  ordered.  Risk Prescription drug management. Decision regarding hospitalization.   This patient presents to the ED for concern of weakness, this involves an extensive number of treatment options, and is a complaint that carries with it a high risk of complications and morbidity.  The differential diagnosis includes dehydration, gi bleed, electrolyte abn, sepsis   Co morbidities that complicate the patient evaluation  asthma, GERD, hypothyroidism, IBS, fibromyalgia, breast cancer, DVT, hld, chf, and chronic resp failure (wears 2L oxygen  at home at night)   Additional history obtained:  Additional history obtained from epic chart review External records from outside source obtained and reviewed including EMS report   Lab Tests:  I Ordered, and personally interpreted labs.  The pertinent results include:  cbc with hgb 9.3 (11.3 on 2/4); cmp with bun 45 and cr 3.12 (bun 30 and cr 1.9 on 2/4); lip nl; lactic acid  1.4; flu/covid/rsv neg   Imaging Studies ordered:  I ordered imaging studies including cxr and ct chest/abd/pelvis I independently visualized and interpreted imaging which showed  CXR: Bibasilar atelectasis or infiltrates.  CT chest/abd/pelvis pending upon admission. I agree with the radiologist interpretation   Cardiac Monitoring:  The patient was maintained on a cardiac monitor.  I personally viewed and interpreted the cardiac monitored which showed  an underlying rhythm of: nsr   Medicines ordered and prescription drug management:  I ordered medication including ivfs/nebs  for sx  Reevaluation of the patient after these medicines showed that the patient improved I have reviewed the patients home medicines and have made adjustments as needed   Test Considered:  ct   Critical Interventions:  ivfs   Consultations Obtained:  I requested consultation with the hospitalist (Dr. Raenelle) ,  and discussed lab and imaging findings as well as pertinent plan - he  will admit   Problem List / ED Course:  Aki:  ivfs given ?pna on CXR:  pt given ivfs.  She qualifies for sepsis criteria, so a code sepsis was called.    IV abx given.  CT chest/abd/pelvis ordered which is pending at shift change. Hypoxia:  pt maintaining O2 sats on 2L   Reevaluation:  After the interventions noted above, I reevaluated the patient and found that they have :improved   Social Determinants of Health:  Lives at home   Dispostion:  After consideration of the diagnostic results and the patients response to treatment, I feel that the patent would benefit from admission.  CRITICAL CARE Performed by: Mliss Boyers   Total critical care time: 30 minutes  Critical care time was exclusive of separately billable procedures and treating other patients.  Critical care was necessary to treat or prevent imminent or life-threatening deterioration.  Critical care was time spent personally by me on the following activities: development of treatment plan with patient and/or surrogate as well as nursing, discussions with consultants, evaluation of patient's response to treatment, examination of patient, obtaining history from patient or surrogate, ordering and performing treatments and interventions, ordering and review of laboratory studies, ordering and review of radiographic studies, pulse oximetry and re-evaluation of patient's condition.           Final Clinical Impression(s) / ED Diagnoses Final diagnoses:  AKI (acute kidney injury) (HCC)  Community acquired pneumonia, unspecified laterality  Sepsis with acute renal failure and septic shock, due to unspecified organism, unspecified acute renal failure type (HCC)  Dehydration  Acute respiratory failure with hypoxia [facility])    Rx / DC Orders ED Discharge Orders     None         Boyers Mliss, MD 03/29/23 1544

## 2023-03-30 DIAGNOSIS — J9601 Acute respiratory failure with hypoxia: Secondary | ICD-10-CM | POA: Diagnosis not present

## 2023-03-30 DIAGNOSIS — A419 Sepsis, unspecified organism: Secondary | ICD-10-CM | POA: Diagnosis not present

## 2023-03-30 DIAGNOSIS — J189 Pneumonia, unspecified organism: Secondary | ICD-10-CM | POA: Diagnosis not present

## 2023-03-30 DIAGNOSIS — N179 Acute kidney failure, unspecified: Secondary | ICD-10-CM | POA: Diagnosis not present

## 2023-03-30 LAB — COMPREHENSIVE METABOLIC PANEL
ALT: 25 U/L (ref 0–44)
AST: 26 U/L (ref 15–41)
Albumin: 2.3 g/dL — ABNORMAL LOW (ref 3.5–5.0)
Alkaline Phosphatase: 46 U/L (ref 38–126)
Anion gap: 17 — ABNORMAL HIGH (ref 5–15)
BUN: 37 mg/dL — ABNORMAL HIGH (ref 8–23)
CO2: 16 mmol/L — ABNORMAL LOW (ref 22–32)
Calcium: 8.3 mg/dL — ABNORMAL LOW (ref 8.9–10.3)
Chloride: 110 mmol/L (ref 98–111)
Creatinine, Ser: 1.87 mg/dL — ABNORMAL HIGH (ref 0.44–1.00)
GFR, Estimated: 28 mL/min — ABNORMAL LOW (ref >60)
Glucose, Bld: 252 mg/dL — ABNORMAL HIGH (ref 70–99)
Potassium: 4.9 mmol/L (ref 3.5–5.1)
Sodium: 143 mmol/L (ref 135–145)
Total Bilirubin: 0.5 mg/dL (ref 0.0–1.2)
Total Protein: 5.9 g/dL — ABNORMAL LOW (ref 6.5–8.1)

## 2023-03-30 LAB — CBC
HCT: 29.8 % — ABNORMAL LOW (ref 36.0–46.0)
Hemoglobin: 9.3 g/dL — ABNORMAL LOW (ref 12.0–15.0)
MCH: 33.9 pg (ref 26.0–34.0)
MCHC: 31.2 g/dL (ref 30.0–36.0)
MCV: 108.8 fL — ABNORMAL HIGH (ref 80.0–100.0)
Platelets: 165 10*3/uL (ref 150–400)
RBC: 2.74 MIL/uL — ABNORMAL LOW (ref 3.87–5.11)
RDW: 16.3 % — ABNORMAL HIGH (ref 11.5–15.5)
WBC: 7.7 10*3/uL (ref 4.0–10.5)
nRBC: 0 % (ref 0.0–0.2)

## 2023-03-30 LAB — GLUCOSE, CAPILLARY
Glucose-Capillary: 133 mg/dL — ABNORMAL HIGH (ref 70–99)
Glucose-Capillary: 215 mg/dL — ABNORMAL HIGH (ref 70–99)
Glucose-Capillary: 228 mg/dL — ABNORMAL HIGH (ref 70–99)
Glucose-Capillary: 127 mg/dL — ABNORMAL HIGH (ref 70–99)

## 2023-03-30 LAB — PROCALCITONIN: Procalcitonin: 0.25 ng/mL

## 2023-03-30 MED ORDER — METRONIDAZOLE 500 MG PO TABS
500.0000 mg | ORAL_TABLET | Freq: Two times a day (BID) | ORAL | Status: DC
  Administered 2023-03-30 – 2023-04-01 (×5): 500 mg via ORAL
  Filled 2023-03-30 (×5): qty 1

## 2023-03-30 MED ORDER — MIDODRINE HCL 5 MG PO TABS
5.0000 mg | ORAL_TABLET | Freq: Two times a day (BID) | ORAL | Status: DC
  Filled 2023-03-30 (×3): qty 1

## 2023-03-30 MED ORDER — LOPERAMIDE HCL 2 MG PO CAPS
2.0000 mg | ORAL_CAPSULE | ORAL | Status: DC | PRN
  Administered 2023-03-30 – 2023-03-31 (×3): 2 mg via ORAL
  Filled 2023-03-30 (×4): qty 1

## 2023-03-30 MED ORDER — MIDODRINE HCL 5 MG PO TABS
5.0000 mg | ORAL_TABLET | Freq: Three times a day (TID) | ORAL | Status: DC
Start: 2023-03-30 — End: 2023-03-30

## 2023-03-30 MED ORDER — ALLOPURINOL 100 MG PO TABS
50.0000 mg | ORAL_TABLET | Freq: Every day | ORAL | Status: DC
  Administered 2023-03-31 – 2023-04-01 (×2): 50 mg via ORAL
  Filled 2023-03-30 (×2): qty 1

## 2023-03-30 MED ORDER — HYDROCORTISONE SOD SUC (PF) 100 MG IJ SOLR
50.0000 mg | Freq: Two times a day (BID) | INTRAMUSCULAR | Status: DC
  Administered 2023-03-30 – 2023-03-31 (×3): 50 mg via INTRAVENOUS
  Filled 2023-03-30 (×3): qty 2

## 2023-03-30 MED ORDER — SODIUM CHLORIDE 0.9 % IV SOLN
1.0000 g | INTRAVENOUS | Status: DC
Start: 2023-03-30 — End: 2023-04-01
  Administered 2023-03-30 – 2023-03-31 (×2): 1 g via INTRAVENOUS
  Filled 2023-03-30 (×2): qty 10

## 2023-03-30 MED ORDER — HYDROCORTISONE SOD SUC (PF) 100 MG IJ SOLR
75.0000 mg | Freq: Two times a day (BID) | INTRAMUSCULAR | Status: DC
  Administered 2023-03-30: 75 mg via INTRAVENOUS
  Filled 2023-03-30: qty 2

## 2023-03-30 MED ORDER — SODIUM CHLORIDE 0.9 % IV SOLN: 3.0000 g | Freq: Three times a day (TID) | INTRAVENOUS | Status: DC

## 2023-03-30 NOTE — Progress Notes (Addendum)
 PROGRESS NOTE        PATIENT DETAILS Name: Ebony Scott Age: 73 y.o. Sex: female Date of Birth: 21-Jun-1950 Admit Date: 03/29/2023 Admitting Physician Donalda [name] Applebaum, MD ERE:Eprxjmi, Butler DASEN, MD  Brief Summary: Patient is a 73 y.o.  female with history of immunosuppression (chronic prednisone  for RA/weekly IVIG for hypogammaglobulinemia)-chronic HFrEF-s/p ICD, CAD, bronchiectasis/asthma with chronic hypoxic respiratory failure on nocturnal oxygen -admitted on 2/7 for severe sepsis with AKI due to PNA.  Significant events: 2/4>> fever/cough-seen by PCP-influenza PCR negative-started on Zithromax /prednisone . 2/7>> weakness/fever/cough-to ED-found to be hypotensive-AKI-pneumonia on CT imaging-admit to [facility].  Significant studies: 2/7>> CT chest/abdomen/pelvis: Multifocal pneumonia-right lung.  Significant microbiology data: 2/4>> influenza A/B PCR: Negative 2/7>> COVID/influenza/RSV PCR: Negative 2/7>> respiratory virus panel: Negative 2/7>> blood culture: No growth.  Procedures: None  Consults: None  Subjective: Feels much better-afebrile overnight  Objective: Vitals: Blood pressure 134/71, pulse 76, temperature 98.2 F (36.8 C), temperature source Oral, resp. rate 20, height 5' (1.524 m), weight 58.6 kg, SpO2 94%.   Exam: Gen Exam:Alert awake-not in any distress HEENT:atraumatic, normocephalic Chest: Few bibasilar rales. CVS:S1S2 regular Abdomen:soft non tender, non distended Extremities:no edema Neurology: Non focal Skin: no rash  Pertinent Labs/Radiology:    Latest Ref Rng & Units 03/30/2023    4:59 AM 03/29/2023   11:01 AM 03/26/2023   12:33 PM  CBC  WBC 4.0 - 10.5 K/uL 7.7  9.9  15.2   Hemoglobin 12.0 - 15.0 g/dL 9.3  9.3  88.6   Hematocrit 36.0 - 46.0 % 29.8  30.2  34.8   Platelets 150 - 400 K/uL 165  171  196     Lab Results  Component Value Date   NA 143 03/30/2023   K 4.9 03/30/2023   CL 110 03/30/2023   CO2 16 (L) 03/30/2023       Assessment/Plan: Severe sepsis likely secondary to PNA-?aspiration Sepsis physiology markedly better-placed on IV vancomycin /cefepime /stress dose hydrocortisone /midodrine  on admission.  No further hypotensive episodes overnight-clinically improved. Decrease midodrine  to 5 mg twice daily with holding parameters-decrease Solu-Cortef  to 50 mg twice daily-will narrow antibiotics to IV Unasyn-high suspicion that she may have aspirated-when she nausea/vomiting a couple of days ago. Continue to monitor closely.  Addendum: PCN allergy  for pharmacy-will do Rocephin /Flagyl  instead of Unasyn   AKI on CKD stage IIIa Suspect AKI hemodynamically mediated Rapidly improving with supportive care Stop IVF-continue to hold nephrotoxic agents Follow renal function closely.  Chronic HFrEF (EF 30-35% by TEE on May 2024) S/p ICD implantation July 2024 Remains euvolemic on exam Continue to hold diuretic/GDMT medications until renal function/sepsis physiology improves a bit more-suspect can be slowly resume the next day or so.  History of CAD s/p PCI to LAD in 2022 No anginal symptoms Continue Plavix /statin   HTN Given hypotension on presentation-all antihypertensives held-patient given stress dose of IV steroids/midodrine  BP now stabilizing-midodrine /IV steroids being tapered down Suspect we can slowly resume BP medications over the next day or so.   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   History of RA Chronically on prednisone  7 mg daily which will be resumed after patient completes several days of stress dose steroids.   Hypogammaglobinemia/chronic immune deficiency On IVIG treatments per immunology  as an outpatient.   DM-2 (A1C 6.0 on Oct 2024) SSI  Recent Labs    03/29/23 2128  03/30/23 0738  GLUCAP 148* 133*    Debility/deconditioning PT/OT/SLP eval pending.  BMI: Estimated body mass index is 25.24 kg/m as calculated from the following:   Height as of this encounter: 5' (1.524 m).   Weight as of this encounter: 58.6 kg.   Code status:   Code Status: Full Code   DVT Prophylaxis: rivaroxaban  (XARELTO ) tablet 20 mg    Family Communication:Daughter-[name] [phone] left VM 2/8   Disposition Plan: Status is: Inpatient Remains inpatient appropriate because: Severity of illness   Planned Discharge Destination:Home health   Diet: Diet Order             Diet heart healthy/carb modified Room service appropriate? Yes; Fluid consistency: Thin  Diet effective now                     Antimicrobial agents: Anti-infectives (From admission, onward)    Start     Dose/Rate Route Frequency Ordered Stop   03/29/23 1800  ceFEPIme  (MAXIPIME ) 2 g in sodium chloride  0.9 % 100 mL IVPB        2 g 200 mL/hr over 30 Minutes Intravenous Every 24 hours 03/29/23 1648     03/29/23 1730  metroNIDAZOLE  (FLAGYL ) IVPB 500 mg        500 mg 100 mL/hr over 60 Minutes Intravenous Every 12 hours 03/29/23 1724     03/29/23 1700  vancomycin  (VANCOREADY) IVPB 1250 mg/250 mL        1,250 mg 166.7 mL/hr over 90 Minutes Intravenous  Once 03/29/23 1648 03/29/23 1956   03/29/23 1648  vancomycin  variable dose per unstable renal function (pharmacist dosing)         Does not apply See admin instructions 03/29/23 1648     03/29/23 1515  cefTRIAXone  (ROCEPHIN ) 2 g in sodium chloride  0.9 % 100 mL IVPB        2 g 200 mL/hr over 30 Minutes Intravenous Once 03/29/23 1504 03/29/23 1614   03/29/23 1515  azithromycin  (ZITHROMAX ) 500 mg in sodium chloride  0.9 % 250 mL IVPB        500 mg 250 mL/hr over 60 Minutes Intravenous  Once 03/29/23 1504 03/29/23 1805        MEDICATIONS: Scheduled Meds:  allopurinol   100 mg Oral Daily   arformoterol   15 mcg Nebulization BID   azelastine    2 spray Each Nare Daily   budesonide  (PULMICORT ) nebulizer solution  0.25 mg Nebulization BID   clopidogrel   75 mg Oral Daily   DULoxetine   20 mg Oral Daily   folic acid   1 mg Oral Daily   guaiFENesin   600 mg Oral BID   hydrocortisone  sod succinate (SOLU-CORTEF ) inj  75 mg Intravenous BID   insulin  aspart  0-9 Units Subcutaneous TID WC   ipratropium-albuterol   3 mL Nebulization TID   loratadine   10 mg Oral Daily   midodrine   5 mg Oral TID WC   pantoprazole   40 mg Oral Daily   revefenacin   175 mcg Nebulization Daily   rivaroxaban   20 mg Oral Q breakfast   traZODone   100 mg Oral QHS   vancomycin  variable dose per unstable renal function (pharmacist dosing)   Does not apply See admin instructions   Continuous Infusions:  ceFEPime  (MAXIPIME ) IV 2 g (03/29/23 2102)   metronidazole  500 mg (03/29/23 2157)   sodium chloride   PRN Meds:.acetaminophen  **OR** acetaminophen , ondansetron  **OR** ondansetron  (ZOFRAN ) IV, polyethylene glycol   I have personally reviewed following labs and imaging studies  LABORATORY DATA: CBC: Recent Labs  Lab 03/26/23 1233 03/29/23 1101 03/30/23 0459  WBC 15.2* 9.9 7.7  NEUTROABS 12,221* 6.8  --   HGB 11.3* 9.3* 9.3*  HCT 34.8* 30.2* 29.8*  MCV 104.8* 109.8* 108.8*  PLT 196 171 165    Basic Metabolic Panel: Recent Labs  Lab 03/26/23 1233 03/29/23 1101 03/30/23 0459  NA 140 142 143  K 4.4 4.4 4.9  CL 103 110 110  CO2 24 21* 16*  GLUCOSE 132* 105* 252*  BUN 30* 45* 37*  CREATININE 1.90* 3.12* 1.87*  CALCIUM  10.2 8.7* 8.3*    GFR: Estimated Creatinine Clearance: 20.8 mL/min (A) (by C-G formula based on SCr of 1.87 mg/dL (H)).  Liver Function Tests: Recent Labs  Lab 03/26/23 1233 03/29/23 1101 03/30/23 0459  AST 29 27 26   ALT 36* 24 25  ALKPHOS  --  45 46  BILITOT 1.1 0.5 0.5  PROT 7.1 6.4* 5.9*  ALBUMIN  --  2.7* 2.3*   Recent Labs  Lab 03/29/23 1101  LIPASE 24   No results for input(s): AMMONIA in the last 168  hours.  Coagulation Profile: No results for input(s): INR, PROTIME in the last 168 hours.  Cardiac Enzymes: No results for input(s): CKTOTAL, CKMB, CKMBINDEX, TROPONINI in the last 168 hours.  BNP (last 3 results) No results for input(s): PROBNP in the last 8760 hours.  Lipid Profile: No results for input(s): CHOL, HDL, LDLCALC, TRIG, CHOLHDL, LDLDIRECT in the last 72 hours.  Thyroid  Function Tests: No results for input(s): TSH, T4TOTAL, FREET4, T3FREE, THYROIDAB in the last 72 hours.  Anemia Panel: No results for input(s): VITAMINB12, FOLATE, FERRITIN, TIBC, IRON , RETICCTPCT in the last 72 hours.  Urine analysis:    Component Value Date/Time   COLORURINE YELLOW 03/29/2023 1543   APPEARANCEUR CLEAR 03/29/2023 1543   LABSPEC 1.014 03/29/2023 1543   PHURINE 5.0 03/29/2023 1543   GLUCOSEU NEGATIVE 03/29/2023 1543   HGBUR NEGATIVE 03/29/2023 1543   BILIRUBINUR NEGATIVE 03/29/2023 1543   KETONESUR NEGATIVE 03/29/2023 1543   PROTEINUR NEGATIVE 03/29/2023 1543   UROBILINOGEN 0.2 05/17/2014 1113   NITRITE NEGATIVE 03/29/2023 1543   LEUKOCYTESUR NEGATIVE 03/29/2023 1543    Sepsis Labs: Lactic Acid , Venous    Component Value Date/Time   LATICACIDVEN 0.7 03/29/2023 1442    MICROBIOLOGY: Recent Results (from the past 240 hours)  Urine Culture     Status: None   Collection Time: 03/26/23  3:58 PM   Specimen: Urine  Result Value Ref Range Status   MICRO NUMBER: [id number]  Final   SPECIMEN QUALITY: Adequate  Final   Sample Source URINE  Final   STATUS: FINAL  Final   Result:   Final    Mixed genital flora isolated. These superficial bacteria are not indicative of a urinary tract infection. No further organism identification is warranted on this specimen. If clinically indicated, recollect clean-catch, mid-stream urine and transfer  immediately to Urine Culture Transport Tube.   Resp panel by RT-PCR (RSV, Flu A&B, Covid)  Anterior Nasal Swab     Status: None   Collection Time: 03/29/23 11:01 AM   Specimen: Anterior Nasal Swab  Result Value Ref Range Status   SARS Coronavirus 2 by RT PCR NEGATIVE NEGATIVE Final   Influenza A by PCR NEGATIVE NEGATIVE Final   Influenza B by PCR NEGATIVE NEGATIVE Final  Comment: (NOTE) The Xpert Xpress SARS-CoV-2/FLU/RSV plus assay is intended as an aid in the diagnosis of influenza from Nasopharyngeal swab specimens and should not be used as a sole basis for treatment. Nasal washings and aspirates are unacceptable for Xpert Xpress SARS-CoV-2/FLU/RSV testing.  Fact Sheet for Patients: bloggercourse.com  Fact Sheet for Healthcare Providers: seriousbroker.it  This test is not yet approved or cleared by the [place]  FDA and has been authorized for detection and/or diagnosis of SARS-CoV-2 by FDA under an Emergency Use Authorization (EUA). This EUA will remain in effect (meaning this test can be used) for the duration of the COVID-19 declaration under Section 564(b)(1) of the Act, 21 U.S.C. section 360bbb-3(b)(1), unless the authorization is terminated or revoked.     Resp Syncytial Virus by PCR NEGATIVE NEGATIVE Final    Comment: (NOTE) Fact Sheet for Patients: bloggercourse.com  Fact Sheet for Healthcare Providers: seriousbroker.it  This test is not yet approved or cleared by the [place]  FDA and has been authorized for detection and/or diagnosis of SARS-CoV-2 by FDA under an Emergency Use Authorization (EUA). This EUA will remain in effect (meaning this test can be used) for the duration of the COVID-19 declaration under Section 564(b)(1) of the Act, 21 U.S.C. section 360bbb-3(b)(1), unless the authorization is terminated or revoked.  Performed at [facility], [address] [place], [place] [address]   Culture, blood (routine x 2)     Status:  None (Preliminary result)   Collection Time: 03/29/23 11:07 AM   Specimen: BLOOD  Result Value Ref Range Status   Specimen Description BLOOD SITE NOT SPECIFIED  Final   Special Requests   Final    BOTTLES DRAWN AEROBIC AND ANAEROBIC Blood Culture adequate volume   Culture   Final    NO GROWTH < 24 HOURS Performed at [facility], [address] [place], [place] [address]    Report Status PENDING  Incomplete  Culture, blood (routine x 2)     Status: None (Preliminary result)   Collection Time: 03/29/23 11:30 AM   Specimen: BLOOD RIGHT HAND  Result Value Ref Range Status   Specimen Description BLOOD RIGHT HAND  Final   Special Requests   Final    BOTTLES DRAWN AEROBIC AND ANAEROBIC Blood Culture results may not be optimal due to an inadequate volume of blood received in culture bottles   Culture   Final    NO GROWTH < 24 HOURS Performed at [facility], [address] [place], [place] [address]    Report Status PENDING  Incomplete  MRSA Next Gen by PCR, Nasal     Status: None   Collection Time: 03/29/23  4:28 PM   Specimen: Nasal Mucosa; Nasal Swab  Result Value Ref Range Status   MRSA by PCR Next Gen NOT DETECTED NOT DETECTED Final    Comment: (NOTE) The GeneXpert MRSA Assay (FDA approved for NASAL specimens only), is one component of a comprehensive MRSA colonization surveillance program. It is not intended to diagnose MRSA infection nor to guide or monitor treatment for MRSA infections. Test performance is not FDA approved in patients less than 56 years old. Performed at [facility], [address] [place], [place] [address]   Respiratory (~20 pathogens) panel by PCR     Status: None   Collection Time: 03/29/23  5:32 PM   Specimen: Nasopharyngeal Swab; Respiratory  Result Value Ref Range Status   Adenovirus NOT DETECTED NOT DETECTED Final  Coronavirus 229E NOT DETECTED NOT DETECTED Final    Comment: (NOTE) The Coronavirus on the Respiratory Panel,  DOES NOT test for the novel  Coronavirus (2019 nCoV)    Coronavirus HKU1 NOT DETECTED NOT DETECTED Final   Coronavirus NL63 NOT DETECTED NOT DETECTED Final   Coronavirus OC43 NOT DETECTED NOT DETECTED Final   Metapneumovirus NOT DETECTED NOT DETECTED Final   Rhinovirus / Enterovirus NOT DETECTED NOT DETECTED Final   Influenza A NOT DETECTED NOT DETECTED Final   Influenza B NOT DETECTED NOT DETECTED Final   Parainfluenza Virus 1 NOT DETECTED NOT DETECTED Final   Parainfluenza Virus 2 NOT DETECTED NOT DETECTED Final   Parainfluenza Virus 3 NOT DETECTED NOT DETECTED Final   Parainfluenza Virus 4 NOT DETECTED NOT DETECTED Final   Respiratory Syncytial Virus NOT DETECTED NOT DETECTED Final   Bordetella pertussis NOT DETECTED NOT DETECTED Final   Bordetella Parapertussis NOT DETECTED NOT DETECTED Final   Chlamydophila pneumoniae NOT DETECTED NOT DETECTED Final   Mycoplasma pneumoniae NOT DETECTED NOT DETECTED Final    Comment: Performed at [facility], [address] [place], [place] [address]    RADIOLOGY STUDIES/RESULTS: CT CHEST ABDOMEN PELVIS WO CONTRAST Result Date: 03/29/2023 CLINICAL DATA:  Sepsis, weakness, uncontrollable coughing, history of breast cancer * Tracking Code: BO * EXAM: CT CHEST, ABDOMEN AND PELVIS WITHOUT CONTRAST TECHNIQUE: Multidetector CT imaging of the chest, abdomen and pelvis was performed following the standard protocol without IV contrast. RADIATION DOSE REDUCTION: This exam was performed according to the departmental dose-optimization program which includes automated exposure control, adjustment of the mA and/or kV according to patient size and/or use of iterative reconstruction technique. COMPARISON:  CT abdomen pelvis, 11/29/2022 FINDINGS: CT CHEST FINDINGS Cardiovascular: Aortic atherosclerosis. Left chest multi lead pacer. Cardiomegaly. Three-vessel coronary artery calcifications and stents. No pericardial effusion. Mediastinum/Nodes: No enlarged  mediastinal, hilar, or axillary lymph nodes. Small hiatal hernia. Thyroid  gland, trachea, and esophagus demonstrate no significant findings. Lungs/Pleura: Heterogeneous airspace opacity and consolidation throughout the right lung base, evaluation of the lungs generally very limited by extensive breath motion artifact (series 4, image 82). No pleural effusion or pneumothorax. Musculoskeletal: Evidence of prior left lumpectomy with dystrophic calcification (series 3, image 28). No acute osseous findings. CT ABDOMEN PELVIS FINDINGS Hepatobiliary: No focal liver abnormality is seen. Status post cholecystectomy. No biliary dilatation. Pancreas: Unremarkable. No pancreatic ductal dilatation or surrounding inflammatory changes. Spleen: Normal in size without significant abnormality. Adrenals/Urinary Tract: Adrenal glands are unremarkable. Kidneys are normal, without renal calculi, solid lesion, or hydronephrosis. Bladder is unremarkable. Stomach/Bowel: Stomach is within normal limits. Appendix appears normal. No evidence of bowel wall thickening, distention, or inflammatory changes. Vascular/Lymphatic: Aortic atherosclerosis. No enlarged abdominal or pelvic lymph nodes. Reproductive: Status post hysterectomy. Other: No abdominal wall hernia or abnormality. No ascites. Musculoskeletal: No acute osseous findings. IMPRESSION: 1. Heterogeneous airspace opacity and consolidation throughout the right lung base, evaluation of the lungs generally very limited by extensive breath motion artifact. Findings are consistent with infection or aspiration. 2. No noncontrast CT evidence of lymphadenopathy or metastatic disease in the chest, abdomen, or pelvis. 3. Evidence of prior left lumpectomy with dystrophic calcification. 4. Cardiomegaly and coronary artery disease. Aortic Atherosclerosis (ICD10-I70.0). Electronically Signed   By: Marolyn JONETTA Jaksch M.D.   On: 03/29/2023 16:33   DG Chest Portable 1 View Result Date: 03/29/2023 CLINICAL  DATA:  Shortness of breath and generalized weakness with productive cough and nausea EXAM: PORTABLE CHEST 1 VIEW COMPARISON:  Radiograph 12/01/2022 FINDINGS:  Stable cardiomediastinal silhouette given patient rotation. Elevated right hemidiaphragm. Bibasilar atelectasis or infiltrate. No pleural effusion or pneumothorax. Left chest wall ICD. Right reverse TSA. Cervical spine fusion hardware. IMPRESSION: Bibasilar atelectasis or infiltrates. Electronically Signed   By: Norman Gatlin M.D.   On: 03/29/2023 13:15     LOS: 1 day   Donalda Applebaum, MD  [facility]    To contact the attending provider between 7A-7P or the covering provider during after hours 7P-7A, please log into the web site [email or web address] and access using universal [place] password for that web site. If you do not have the password, please call the hospital operator.  03/30/2023, 9:37 AM

## 2023-03-30 NOTE — Evaluation (Signed)
 Clinical/Bedside Swallow Evaluation Patient Details  Name: Ebony Scott MRN: [id number] Date of Birth: Mar 08, 1950  Today's Date: 03/30/2023 Time: SLP Start Time (ACUTE ONLY): 9066 SLP Stop Time (ACUTE ONLY): 0943 SLP Time Calculation (min) (ACUTE ONLY): 10 min  Past Medical History:  Past Medical History:  Diagnosis Date   Anemia    Angio-edema    Breast cancer (HCC) 1998   Left breast, in remission   Bronchiectasis (HCC)    CHF (congestive heart failure) (HCC)    Clostridium difficile colitis 01/14/2018   Coronary artery disease    Depression    some; don't take anything for it (02/20/2012), pt denies dx as of 06/02/21   Diverticulosis    DVT (deep venous thrombosis) (HCC)    Fibromyalgia 11/2011   GAVE (gastric antral vascular ectasia)    GERD (gastroesophageal reflux disease)    Headache(784.0)    related to allergies; more at different times during the year (02/20/2012)   Hemorrhoids    Hiatal hernia    back and neck   History of blood transfusion 2023   1 unit, 5 units of Iron    Hx of adenomatous colonic polyps 04/12/2016   Hypercholesteremia    good cholesterol is high   Hypothyroidism    h/o Graves disease   IBS (irritable bowel syndrome)    Moderate persistent asthma    -FeV1 72% 2011, -IgE 102 2011, CT sinus Neg 2011   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 Cindy Setter   Sleep apnea    uses cpap nightly   Tracheobronchomalacia    Past Surgical History:  Past Surgical History:  Procedure Laterality Date   ABDOMINAL HYSTERECTOMY N/A    Phreesia 10/21/2019   ANTERIOR AND POSTERIOR REPAIR  1990's   APPENDECTOMY     ARGON LASER APPLICATION  02/05/2021   Procedure: ARGON LASER APPLICATION;  Surgeon: Leigh Elspeth SQUIBB, MD;  Location: [facility];  Service: Gastroenterology;;   BREAST  LUMPECTOMY  1998   left   BREAST SURGERY N/A    Phreesia 10/21/2019   BRONCHIAL WASHINGS  04/05/2020   Procedure: BRONCHIAL WASHINGS;  Surgeon: Kara Dorn NOVAK, MD;  Location: [facility];  Service: Pulmonary;;   CAPSULOTOMY Right 08/28/2021   Procedure: CAPSULOTOMY;  Surgeon: Tobie Franky SQUIBB, DPM;  Location: [facility];  Service: Podiatry;  Laterality: Right;   CARPOMETACARPEL (CMC) FUSION OF THUMB WITH AUTOGRAFT FROM RADIUS  ~ 2009   both thumbs (02/20/2012)   CATARACT EXTRACTION W/ INTRAOCULAR LENS  IMPLANT, BILATERAL  2012   CERVICAL DISCECTOMY  10/2001   C5-C6   CERVICAL FUSION  2003   C3-C4   CHOLECYSTECTOMY     COLONOSCOPY     CORONARY STENT INTERVENTION N/A 01/11/2021   Procedure: CORONARY STENT INTERVENTION;  Surgeon: Anner Alm ORN, MD;  Location: [facility] CV [facility];  Service: Cardiovascular;  Laterality: N/A;   DEBRIDEMENT TENNIS ELBOW  ?1970's   right   ESOPHAGOGASTRODUODENOSCOPY     ESOPHAGOGASTRODUODENOSCOPY (EGD) WITH PROPOFOL  N/A 02/05/2021   Procedure: ESOPHAGOGASTRODUODENOSCOPY (EGD) WITH PROPOFOL ;  Surgeon: Leigh Elspeth SQUIBB, MD;  Location: [facility];  Service: Gastroenterology;  Laterality: N/A;   EYE SURGERY N/A    Phreesia 10/21/2019   HAMMER TOE SURGERY Right 08/28/2021   Procedure: HAMMER TOE REPAIR 2ND TOE RIGHT FOOT;  Surgeon:  Tobie Franky SQUIBB, DPM;  Location: [facility];  Service: Podiatry;  Laterality: Right;   HYSTERECTOMY     I & D EXTREMITY Right 09/22/2020   Procedure: IRRIGATION AND DEBRIDEMENT RIGHT HAND AND ELBOW;  Surgeon: Josefina Chew, MD;  Location: [facility];  Service: Orthopedics;  Laterality: Right;   ICD IMPLANT N/A 08/27/2022   Procedure: ICD IMPLANT;  Surgeon: Nancey Eulas BRAVO, MD;  Location: [facility];  Service: Cardiovascular;  Laterality: N/A;   KNEE ARTHROPLASTY  ?8009'd   ?right; w/cartilage repair (02/20/2012)   MASS EXCISION Left 08/28/2021   Procedure: EXCISION OF SOFT TISSUE  MASS LEFT FOOT;  Surgeon: Tobie Franky SQUIBB, DPM;   Location: [facility];  Service: Podiatry;  Laterality: Left;   NASAL SEPTUM SURGERY  1980's   POSTERIOR CERVICAL FUSION/FORAMINOTOMY  2004   failed initial fusion; rewired  anterior neck (02/20/2012)   REVERSE SHOULDER ARTHROPLASTY Right 02/16/2020   Procedure: REVERSE SHOULDER ARTHROPLASTY;  Surgeon: Josefina Chew, MD;  Location: [facility];  Service: Orthopedics;  Laterality: Right;   RIGHT/LEFT HEART CATH AND CORONARY ANGIOGRAPHY N/A 01/11/2021   Procedure: RIGHT/LEFT HEART CATH AND CORONARY ANGIOGRAPHY;  Surgeon: Anner Alm ORN, MD;  Location: [facility] INVASIVE CV LAB;  Service: Cardiovascular;  Laterality: N/A;   SPINE SURGERY N/A    Phreesia 10/21/2019   TONSILLECTOMY  ~ 1953   VESICOVAGINAL FISTULA CLOSURE W/ TAH  1988   VIDEO BRONCHOSCOPY Bilateral 08/23/2016   Procedure: VIDEO BRONCHOSCOPY WITH FLUORO;  Surgeon: Noreen Tonnie BRAVO, MD;  Location: [facility];  Service: Cardiopulmonary;  Laterality: Bilateral;   VIDEO BRONCHOSCOPY N/A 04/05/2020   Procedure: VIDEO BRONCHOSCOPY WITHOUT FLUORO;  Surgeon: Kara Dorn NOVAK, MD;  Location: [facility];  Service: Pulmonary;  Laterality: N/A;   HPI:  Ebony Scott is a 73 y.o. 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 Pt had an MBS in 2016 that indicated mild structural oropharyngeal dysphagia with a diet rec of regular thins. Pt reports havinf PNA at least 4x in the past few years and coughing constantly outside of meals but not consistently dueing PO intake.    Assessment / Plan / Recommendation  Clinical Impression  Pt seen for skilled ST services for clinical swallow evaluation. The pt is currently on a regular/thin liquid diet and was assessed with thin liquid, puree, and regular solids. The pt's OME  was [facility]. The pt consumed all consistencies with no overt s/sx of aspiration. Pt reports no difficulty with PO intake and was able to recall previous education from previous speech assessments. Pt educated on the s/sx of aspiration and informed to alert nursing staff if any changes to PO intake occur. Safest diet rec remains regular solids with thin liquid with general safe swallow precautions (sit upright for PO intake, reduce bolus size, reduce rate of intake). No further acute needs at this time, SLP signing off. Please reconsult if further needs arise. SLP Visit Diagnosis: Dysphagia, unspecified (R13.10)    Aspiration Risk  No limitations    Diet Recommendation Regular;Thin liquid    Liquid Administration via: Cup;Straw Medication Administration: Whole meds with liquid Supervision: Patient able to self feed Compensations: Slow rate;Small sips/bites Postural Changes: Seated upright at 90 degrees;Remain upright for at least 30 minutes after po intake    Other  Recommendations Oral  Care Recommendations: Oral care BID    Recommendations for follow up therapy are one component of a multi-disciplinary discharge planning process, led by the attending physician.  Recommendations may be updated based on patient status, additional functional criteria and insurance authorization.  Follow up Recommendations No SLP follow up      Assistance Recommended at Discharge    Functional Status Assessment Patient has had a recent decline in their functional status and demonstrates the ability to make significant improvements in function in a reasonable and predictable amount of time.  Frequency and Duration            Prognosis Prognosis for improved oropharyngeal function: Good      Swallow Study   General Date of Onset: 03/29/23 HPI: Ebony Scott is a 73 y.o. 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 Pt had an MBS in 2016 that indicated mild structural oropharyngeal dysphagia with a diet rec of regular thins. Pt reports havinf PNA at least 4x in the past few years and coughing constantly outside of meals but not consistently dueing PO intake. Type of Study: Bedside Swallow Evaluation Previous Swallow Assessment: MBS 2016 Diet Prior to this Study: Regular;Thin liquids (Level 0) Temperature Spikes Noted: No Respiratory Status: Room air Behavior/Cognition: Cooperative;Pleasant mood;Alert Oral Cavity Assessment: Within Functional Limits Oral Care Completed by SLP: No Oral Cavity - Dentition: Adequate natural dentition Vision: Functional for self-feeding Patient Positioning: Upright in chair Baseline Vocal Quality: Hoarse (due to chronic cough) Volitional Cough: Strong;Congested Volitional Swallow: Able to elicit    Oral/Motor/Sensory Function Overall Oral Motor/Sensory Function: Within functional limits   Ice Chips Ice chips: Not tested   Thin Liquid Thin Liquid: Within functional limits Presentation: Self Fed;Straw    Nectar Thick Nectar Thick Liquid: Not tested   Honey Thick Honey Thick Liquid: Not tested   Puree Puree: Within functional limits Presentation: Self Fed;Spoon   Solid     Solid: Within functional limits Presentation: Self Fed     Manuelita Blew M.S. CCC-SLP

## 2023-03-30 NOTE — Evaluation (Signed)
 Occupational Therapy Evaluation Patient Details Name: Ebony Scott MRN: [id number] DOB: [birth year] Today's Date: 03/30/2023   History of Present Illness Patient is a 73 y.o.  female with history of immunosuppression (chronic prednisone  for RA/weekly IVIG for hypogammaglobulinemia)-chronic HFrEF-s/p ICD, CAD, bronchiectasis/asthma with chronic hypoxic respiratory failure on nocturnal oxygen -admitted on 2/7 for severe sepsis with AKI due to PNA.   Clinical Impression   PTA, pt reports she was independent with ADL/IADL and functional mobility at RW level. Pt reports she has an aid to assist 1x/week with IADL and occasionally ADL as needed. Pt currently demonstrates ability to complete grooming, dressing, toileting and functional mobility with RW at modified independent level. SpO2 >95% on RA throughout session. Pt noted to have increased DoE 3/4 with ADL, O2 >95% on RA. Educated pt on pursed lip breathing and energy conservation strategies. Pt reports feeling close to baseline. Encouraged pt to continue to ambulate with nursing staff during admission to address activity tolerance and maintain strength. Pt verbalized understanding. Patient evaluated by Occupational Therapy with no further acute OT needs identified. All education has been completed and the patient has no further questions. See below for any follow-up Occupational Therapy or equipment needs. OT to sign off. Thank you for referral.        If plan is discharge home, recommend the following: Assistance with cooking/housework    Functional Status Assessment  Patient has not had a recent decline in their functional status  Equipment Recommendations  None recommended by OT    Recommendations for Other Services       Precautions / Restrictions Precautions Precautions: Fall Precaution Comments: droplet Restrictions Weight Bearing Restrictions Per Provider Order: No      Mobility Bed Mobility Overal bed mobility: Modified  Independent             General bed mobility comments: hob elevated, increased use of momenutm    Transfers Overall transfer level: Modified independent Equipment used: Rolling walker (2 wheels)                      Balance Overall balance assessment: Modified Independent                                         ADL either performed or assessed with clinical judgement   ADL Overall ADL's : Modified independent                                       General ADL Comments: pt demonstrated ability to complete grooming, toileting and functional mobility at modified independent level with use of RW pt on RA throughout and SpO2 >95%, increased DoE 3/4 with activity educated pt on pursed lip breathing and energy conservation strategies. educated pt on importance of frequent mobility and encouraged her to ambulate with nursing assistance 3x/day     Vision Patient Visual Report: No change from baseline       Perception         Praxis         Pertinent Vitals/Pain Pain Assessment Pain Assessment: No/denies pain     Extremity/Trunk Assessment Upper Extremity Assessment Upper Extremity Assessment: Overall WFL for tasks assessed   Lower Extremity Assessment Lower Extremity Assessment: Defer to PT evaluation   Cervical / Trunk Assessment Cervical /  Trunk Assessment: Normal   Communication Communication Communication: No apparent difficulties   Cognition Arousal: Alert Behavior During Therapy: WFL for tasks assessed/performed Overall Cognitive Status: Within Functional Limits for tasks assessed                                       General Comments  vss on RA    Exercises     Shoulder Instructions      Home Living Family/patient expects to be discharged to:: Private residence Living Arrangements: Children Available Help at Discharge: Family;Available PRN/intermittently Type of Home: House Home Access:  Stairs to enter [employer] of Steps: 6 Entrance Stairs-Rails: Right;Left Home Layout: Two level;Able to live on main level with bedroom/bathroom Alternate Level Stairs-Number of Steps: home  info obtained from previous encounter since patient unable to provide   Bathroom Shower/Tub: Producer, Television/film/video: Handicapped height Bathroom Accessibility: Yes   Home Equipment: Rollator (4 wheels)   Additional Comments: pts daughter lives in the basement      Prior Functioning/Environment Prior Level of Function : Independent/Modified Independent             Mobility Comments: pt reports using rollator walker in the home and community distances ADLs Comments: pt reports caregiver assists with showers at home, otherwise ind with self care;caregiver comes 1x per week for 1-4hrs dependent on pt need        OT Problem List: Cardiopulmonary status limiting activity      OT Treatment/Interventions:      OT Goals(Current goals can be found in the care plan section) Acute Rehab OT Goals Patient Stated Goal: to go home OT Goal Formulation: With patient Time For Goal Achievement: 04/13/23 Potential to Achieve Goals: Good  OT Frequency:      Co-evaluation              AM-PAC OT 6 Clicks Daily Activity     Outcome Measure Help from another person eating meals?: None Help from another person taking care of personal grooming?: None Help from another person toileting, which includes using toliet, bedpan, or urinal?: None Help from another person bathing (including washing, rinsing, drying)?: None Help from another person to put on and taking off regular upper body clothing?: None Help from another person to put on and taking off regular lower body clothing?: None 6 Click Score: 24   End of Session Equipment Utilized During Treatment: Rolling walker (2 wheels) Nurse Communication: Mobility status  Activity Tolerance: Patient tolerated treatment  well Patient left: in bed;with call bell/phone within reach  OT Visit Diagnosis: Other abnormalities of gait and mobility (R26.89)                Time: 8855-8841 OT Time Calculation (min): 14 min Charges:  OT General Charges $OT Visit: 1 Visit OT Evaluation $OT Eval Low Complexity: 1 Low  Collier Bohnet OTR/L Acute Rehabilitation Services Office: [phone]   Ebony Scott 03/30/2023, 12:13 PM

## 2023-03-30 NOTE — Evaluation (Signed)
 Physical Therapy Evaluation Patient Details Name: Ebony Scott MRN: [id number] DOB: 30-Oct-1950 Today's Date: 03/30/2023  History of Present Illness  Patient is a 73 y.o.  female with history of immunosuppression (chronic prednisone  for RA/weekly IVIG for hypogammaglobulinemia)-chronic HFrEF-s/p ICD, CAD, bronchiectasis/asthma with chronic hypoxic respiratory failure on nocturnal oxygen -admitted on 2/7 for severe sepsis with AKI due to PNA.  Clinical Impression  Pt presents with admitting diagnosis above. Pt able to ambulate household distances with RW at supervision level. Pt appears to be at baseline for mobility however would likely benefit from stair training prior to DC. Anticipate pt will not need any follow up PT upon DC.        If plan is discharge home, recommend the following: Help with stairs or ramp for entrance;Assist for transportation   Can travel by private vehicle        Equipment Recommendations None recommended by PT  Recommendations for Other Services       Functional Status Assessment Patient has had a recent decline in their functional status and demonstrates the ability to make significant improvements in function in a reasonable and predictable amount of time.     Precautions / Restrictions Precautions Precautions: Fall Restrictions Weight Bearing Restrictions Per Provider Order: No      Mobility  Bed Mobility               General bed mobility comments: Up in chair    Transfers Overall transfer level: Modified independent Equipment used: Rolling walker (2 wheels)                    Ambulation/Gait Ambulation/Gait assistance: Supervision Gait Distance (Feet): 25 Feet Assistive device: Rolling walker (2 wheels) Gait Pattern/deviations: Decreased stride length, Step-through pattern Gait velocity: decreased     General Gait Details: Pt able to ambulate to door and back. Appears to be at baseline.  Stairs            Wheelchair  Mobility     Tilt Bed    Modified Rankin (Stroke Patients Only)       Balance Overall balance assessment: Modified Independent                                           Pertinent Vitals/Pain Pain Assessment Pain Assessment: No/denies pain    Home Living Family/patient expects to be discharged to:: Private residence Living Arrangements: Children Available Help at Discharge: Family;Available PRN/intermittently Type of Home: House Home Access: Stairs to enter Entrance Stairs-Rails: Right;Left Entrance Stairs-Number of Steps: 6   Home Layout: Two level;Able to live on main level with bedroom/bathroom Home Equipment: Rollator (4 wheels) Additional Comments: pts daughter lives in the basement    Prior Function Prior Level of Function : Independent/Modified Independent             Mobility Comments: pt reports using rollator walker in the home and community distances ADLs Comments: pt reports caregiver assists with showers at home, otherwise ind with self care;caregiver comes 1x per week for 1-4hrs dependent on pt need     Extremity/Trunk Assessment   Upper Extremity Assessment Upper Extremity Assessment: Overall WFL for tasks assessed    Lower Extremity Assessment Lower Extremity Assessment: Overall WFL for tasks assessed    Cervical / Trunk Assessment Cervical / Trunk Assessment: Normal  Communication   Communication Communication: No apparent difficulties  Cognition  Arousal: Alert Behavior During Therapy: WFL for tasks assessed/performed Overall Cognitive Status: Within Functional Limits for tasks assessed                                          General Comments General comments (skin integrity, edema, etc.): VSS    Exercises     Assessment/Plan    PT Assessment Patient needs continued PT services  PT Problem List Decreased strength;Decreased range of motion;Decreased activity tolerance;Decreased  balance;Decreased mobility;Decreased coordination;Decreased knowledge of use of DME;Decreased safety awareness;Decreased knowledge of precautions;Cardiopulmonary status limiting activity       PT Treatment Interventions DME instruction;Gait training;Stair training;Functional mobility training;Therapeutic activities;Therapeutic exercise;Balance training;Neuromuscular re-education;Patient/family education    PT Goals (Current goals can be found in the Care Plan section)  Acute Rehab PT Goals Patient Stated Goal: to go home PT Goal Formulation: With patient Time For Goal Achievement: 04/13/23 Potential to Achieve Goals: Good    Frequency Min 1X/week     Co-evaluation               AM-PAC PT 6 Clicks Mobility  Outcome Measure Help needed turning from your back to your side while in a flat bed without using bedrails?: None Help needed moving from lying on your back to sitting on the side of a flat bed without using bedrails?: None Help needed moving to and from a bed to a chair (including a wheelchair)?: None Help needed standing up from a chair using your arms (e.g., wheelchair or bedside chair)?: None Help needed to walk in hospital room?: A Little Help needed climbing 3-5 steps with a railing? : A Little 6 Click Score: 22    End of Session Equipment Utilized During Treatment: Gait belt Activity Tolerance: Patient tolerated treatment well Patient left: in chair;with call bell/phone within reach Nurse Communication: Mobility status PT Visit Diagnosis: Other abnormalities of gait and mobility (R26.89)    Time: 9040-8991 PT Time Calculation (min) (ACUTE ONLY): 9 min   Charges:   PT Evaluation $PT Eval Low Complexity: 1 Low   PT General Charges $$ ACUTE PT VISIT: 1 Visit         Sueellen NOVAK, PT, DPT Acute Rehab Services [phone]   Marlon Suleiman 03/30/2023, 2:22 PM

## 2023-03-30 NOTE — Progress Notes (Addendum)
 Pharmacy Antibiotic Note  Addendum: Will opt for ceftriaxone /flagyl  instead of Unasyn given no previous history of penicillin tolerance - only proven cephalosporin tolerance. No renal adjustments for these medications, pharmacy will sign off but follow in the periphery.   Plan:  Start ceftriaxone  1g q24 hours  Continue Flagyl  500mg  BID  Monitor signs and symptoms of infection, renal function, cultures, and ability to de-escalate   Ebony Scott is a 73 y.o. female admitted on 03/29/2023 with sepsis.  Pharmacy has been consulted for Unasyn dosing for aspiration pneumonia.   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 Patient presenting with weakness. She was previously on vancomycin , cefepime , and flagyl , now narrowing to Unasyn.   Plan: Start Unasyn 3g q8 hours  Stop Cefepime , Vancomycin , and Flagyl   Monitor signs and symptoms of infection, renal function, cultures, and ability to de-escalate    Height: 5' (152.4 cm) Weight: 58.6 kg (129 lb 4 oz) IBW/kg (Calculated) : 45.5  Temp (24hrs), Avg:98.1 F (36.7 C), Min:97.6 F (36.4 C), Max:98.6 F (37 C)  Recent Labs  Lab 03/26/23 1233 03/29/23 1101 03/29/23 1114 03/29/23 1442 03/30/23 0459  WBC 15.2* 9.9  --   --  7.7  CREATININE 1.90* 3.12*  --   --  1.87*  LATICACIDVEN  --   --  1.4 0.7  --     Estimated Creatinine Clearance: 20.8 mL/min (A) (by C-G formula based on SCr of 1.87 mg/dL (H)).    Allergies  Allergen Reactions   Baclofen  Anaphylaxis and Other (See Comments)    Altered mental status requiring a 3-day hospital stay- patient became unresponsive   Put me in a coma for five days, Altered mental status requiring a 3-day hospital stay- patient became unresponsive   Other Shortness Of Breath and Other (See Comments)    Grass and weeds sneezing; filled sinuses (02/20/2012)   Pork-Derived Products Anaphylaxis   Shellfish Allergy  Anaphylaxis, Shortness Of  Breath, Itching and Rash   Shrimp Extract Anaphylaxis and Other (See Comments)    ALL SHELLFISH   Tetracycline Hcl Nausea And Vomiting   Xolair  [Omalizumab ] Other (See Comments)    Caused Blood clot   Zoledronic  Acid Other (See Comments)    Reclast - Fever, Put in hospital, dr said it was a reaction from a reaction    Fever, inability to walk, Put in hospital  Fever, Put in hospital, dr said it was a reaction from a reaction , Other reaction(s): Unknown   Celecoxib Swelling    Feet swelling   Dilaudid  [Hydromorphone  Hcl] Itching   Hydrocodone -Acetaminophen  Itching   Levofloxacin  Other (See Comments)    GI upset   Molds & Smuts Cough   Morphine  And Codeine Hives and Itching   Oxycodone  Hcl Nausea And Vomiting    GI Intolerance   Paroxetine Nausea And Vomiting and Other (See Comments)    GI Intolerance also   Penicillins Rash and Other (See Comments)    welts (02/20/2012)  Tolerated Ancef  02/16/20   Tetracycline Hcl Other (See Comments)    GI Intolerance   Oxycodone -Acetaminophen  Itching   Diltiazem  Hcl Swelling   Dust Mite Extract Other (See Comments) and Cough    sneezing (02/20/2012) too   Gamunex [Immune Globulin ] Itching and Rash    Tolerates hizentra     Penicillin G Procaine Rash and Other (See Comments)    Welts, also   Rituximab  Other (See Comments)    Anemia    Rofecoxib  Swelling    Vioxx- feet swelling   Tree Extract Itching    tested and told I was allergic to it; never experienced a reaction to it (02/20/2012)    Antimicrobials this admission: Azithromycin  x 1 on 2/7 Ceftraixone x 1 on 2/7 Cefepime   2/7 > 2/8 Vancomycin  1250mg  x 1  Flagyl  2/7 > 2/8  Dose adjustments this admission: N/A  Microbiology results: 2/7 BCx: [place] < 24 hours 2/7 MRSA PCR: negative 2/7 Resp Panel: negative   Thank you for allowing pharmacy to be a part of this patient's care.  Chiquita LOIS Morin, PharmD, BCPS PGY2 Pharmacy Resident

## 2023-03-31 ENCOUNTER — Inpatient Hospital Stay (HOSPITAL_COMMUNITY): Payer: Medicare PPO

## 2023-03-31 DIAGNOSIS — J189 Pneumonia, unspecified organism: Secondary | ICD-10-CM | POA: Diagnosis not present

## 2023-03-31 LAB — CBC WITH DIFFERENTIAL/PLATELET
Abs Immature Granulocytes: 0.15 10*3/uL — ABNORMAL HIGH (ref 0.00–0.07)
Basophils Absolute: 0 10*3/uL (ref 0.0–0.1)
Basophils Relative: 0 %
Eosinophils Absolute: 0 10*3/uL (ref 0.0–0.5)
Eosinophils Relative: 0 %
HCT: 29.1 % — ABNORMAL LOW (ref 36.0–46.0)
Hemoglobin: 9.2 g/dL — ABNORMAL LOW (ref 12.0–15.0)
Immature Granulocytes: 1 %
Lymphocytes Relative: 7 %
Lymphs Abs: 0.9 10*3/uL (ref 0.7–4.0)
MCH: 33.8 pg (ref 26.0–34.0)
MCHC: 31.6 g/dL (ref 30.0–36.0)
MCV: 107 fL — ABNORMAL HIGH (ref 80.0–100.0)
Monocytes Absolute: 1.3 10*3/uL — ABNORMAL HIGH (ref 0.1–1.0)
Monocytes Relative: 12 %
Neutro Abs: 9.2 10*3/uL — ABNORMAL HIGH (ref 1.7–7.7)
Neutrophils Relative %: 80 %
Platelets: 165 10*3/uL (ref 150–400)
RBC: 2.72 MIL/uL — ABNORMAL LOW (ref 3.87–5.11)
RDW: 16.2 % — ABNORMAL HIGH (ref 11.5–15.5)
WBC: 11.6 10*3/uL — ABNORMAL HIGH (ref 4.0–10.5)
nRBC: 0 % (ref 0.0–0.2)

## 2023-03-31 LAB — BASIC METABOLIC PANEL
Anion gap: 9 (ref 5–15)
BUN: 28 mg/dL — ABNORMAL HIGH (ref 8–23)
CO2: 19 mmol/L — ABNORMAL LOW (ref 22–32)
Calcium: 8.7 mg/dL — ABNORMAL LOW (ref 8.9–10.3)
Chloride: 115 mmol/L — ABNORMAL HIGH (ref 98–111)
Creatinine, Ser: 1.28 mg/dL — ABNORMAL HIGH (ref 0.44–1.00)
GFR, Estimated: 44 mL/min — ABNORMAL LOW (ref >60)
Glucose, Bld: 119 mg/dL — ABNORMAL HIGH (ref 70–99)
Potassium: 4.5 mmol/L (ref 3.5–5.1)
Sodium: 143 mmol/L (ref 135–145)

## 2023-03-31 LAB — GLUCOSE, CAPILLARY
Glucose-Capillary: 97 mg/dL (ref 70–99)
Glucose-Capillary: 151 mg/dL — ABNORMAL HIGH (ref 70–99)
Glucose-Capillary: 103 mg/dL — ABNORMAL HIGH (ref 70–99)
Glucose-Capillary: 110 mg/dL — ABNORMAL HIGH (ref 70–99)

## 2023-03-31 LAB — MAGNESIUM: Magnesium: 2 mg/dL (ref 1.7–2.4)

## 2023-03-31 LAB — PHOSPHORUS: Phosphorus: 3 mg/dL (ref 2.5–4.6)

## 2023-03-31 LAB — PROCALCITONIN: Procalcitonin: 0.19 ng/mL

## 2023-03-31 LAB — C-REACTIVE PROTEIN: CRP: 4.3 mg/dL — ABNORMAL HIGH (ref <1.0)

## 2023-03-31 LAB — BRAIN NATRIURETIC PEPTIDE: B Natriuretic Peptide: 1953.7 pg/mL — ABNORMAL HIGH (ref 0.0–100.0)

## 2023-03-31 MED ORDER — FUROSEMIDE 40 MG PO TABS
40.0000 mg | ORAL_TABLET | Freq: Once | ORAL | Status: AC
  Administered 2023-03-31: 40 mg via ORAL
  Filled 2023-03-31: qty 1

## 2023-03-31 MED ORDER — TRAMADOL HCL 50 MG PO TABS
50.0000 mg | ORAL_TABLET | Freq: Once | ORAL | Status: AC | PRN
  Administered 2023-03-31: 50 mg via ORAL
  Filled 2023-03-31: qty 1

## 2023-03-31 NOTE — Plan of Care (Signed)
   Problem: Education: Goal: Ability to describe self-care measures that may prevent or decrease complications (Diabetes Survival Skills Education) will improve Outcome: Progressing Goal: Individualized Educational Video(s) Outcome: Progressing   Problem: Coping: Goal: Ability to adjust to condition or change in health will improve Outcome: Progressing   Problem: Fluid Volume: Goal: Ability to maintain a balanced intake and output will improve Outcome: Progressing   Problem: Health Behavior/Discharge Planning: Goal: Ability to identify and utilize available resources and services will improve Outcome: Progressing Goal: Ability to manage health-related needs will improve Outcome: Progressing   Problem: Metabolic: Goal: Ability to maintain appropriate glucose levels will improve Outcome: Progressing   Problem: Nutritional: Goal: Maintenance of adequate nutrition will improve Outcome: Progressing Goal: Progress toward achieving an optimal weight will improve Outcome: Progressing   Problem: Skin Integrity: Goal: Risk for impaired skin integrity will decrease Outcome: Progressing   Problem: Tissue Perfusion: Goal: Adequacy of tissue perfusion will improve Outcome: Progressing   Problem: Education: Goal: Knowledge of General Education information will improve Description: Including pain rating scale, medication(s)/side effects and non-pharmacologic comfort measures Outcome: Progressing   Problem: Health Behavior/Discharge Planning: Goal: Ability to manage health-related needs will improve Outcome: Progressing   Problem: Clinical Measurements: Goal: Ability to maintain clinical measurements within normal limits will improve Outcome: Progressing Goal: Will remain free from infection Outcome: Progressing Goal: Diagnostic test results will improve Outcome: Progressing Goal: Respiratory complications will improve Outcome: Progressing Goal: Cardiovascular complication will  be avoided Outcome: Progressing   Problem: Activity: Goal: Risk for activity intolerance will decrease Outcome: Progressing   Problem: Nutrition: Goal: Adequate nutrition will be maintained Outcome: Progressing   Problem: Coping: Goal: Level of anxiety will decrease Outcome: Progressing   Problem: Elimination: Goal: Will not experience complications related to bowel motility Outcome: Progressing Goal: Will not experience complications related to urinary retention Outcome: Progressing   Problem: Pain Managment: Goal: General experience of comfort will improve and/or be controlled Outcome: Progressing   Problem: Safety: Goal: Ability to remain free from injury will improve Outcome: Progressing

## 2023-03-31 NOTE — Progress Notes (Signed)
 SLP Cancellation Note  Patient Details Name: Ebony Scott MRN: [id number] DOB: 09-01-1950   Cancelled treatment:       Reason Eval/Treat Not Completed: New orders for swallowing evaluation received. SLP evaluated pt last date with functional swallow and recommended a regular diet without further f/u. Discussed with MD, who reports no acute concerns. Will s/o acutely, but please re-consult if concerns arise.     Damien Blumenthal, M.A., CF-SLP Speech Language Pathology, Acute Rehabilitation Services  Secure Chat preferred [phone]  03/31/2023, 12:29 PM

## 2023-03-31 NOTE — Progress Notes (Signed)
 PROGRESS NOTE        PATIENT DETAILS Name: Ebony Scott Age: 73 y.o. Sex: female Date of Birth: 1950/04/20 Admit Date: 03/29/2023 Admitting Physician Donalda [name] Applebaum, MD ERE:Eprxjmi, Butler DASEN, MD  Brief Summary: Patient is a 73 y.o.  female with history of immunosuppression (chronic prednisone  for RA/weekly IVIG for hypogammaglobulinemia)-chronic HFrEF-s/p ICD, CAD, bronchiectasis/asthma with chronic hypoxic respiratory failure on nocturnal oxygen -admitted on 2/7 for severe sepsis with AKI due to PNA.  Significant events: 2/4>> fever/cough-seen by PCP-influenza PCR negative-started on Zithromax /prednisone . 2/7>> weakness/fever/cough-to ED-found to be hypotensive-AKI-pneumonia on CT imaging-admit to [facility].  Significant studies: 2/7>> CT chest/abdomen/pelvis: Multifocal pneumonia-right lung.  Significant microbiology data: 2/4>> influenza A/B PCR: Negative 2/7>> COVID/influenza/RSV PCR: Negative 2/7>> respiratory virus panel: Negative 2/7>> blood culture: No growth.  Procedures: None  Consults: None  Subjective:   Patient in bed, appears comfortable, denies any headache, no fever, no chest pain or pressure, improving shortness of breath , no abdominal pain. No new focal weakness.   Objective: Vitals: Blood pressure (!) 149/68, pulse 60, temperature 97.6 F (36.4 C), temperature source Oral, resp. rate 19, height 5' (1.524 m), weight 58.6 kg, SpO2 97%.   Exam:  Awake Alert, No new F.N deficits, Normal affect [place].AT,PERRAL Supple Neck, No JVD,   Symmetrical Chest wall movement, Good air movement bilaterally, few rales RRR,No Gallops, Rubs or new Murmurs,  +ve B.Sounds, Abd Soft, No tenderness,   No Cyanosis, Clubbing or edema   Assessment/Plan:  Severe sepsis likely secondary to PNA-?aspiration Sepsis physiology markedly better-placed on IV vancomycin /cefepime /stress dose hydrocortisone /midodrine  on admission.  No further hypotensive episodes  overnight-clinically improved. Decrease midodrine  to 5 mg twice daily with holding parameters-decrease Solu-Cortef  to 50 mg twice daily-will narrow antibiotics to  Rocephin /Flagyl  -high suspicion that she may have aspirated-when she nausea/vomiting a couple of days ago. Continue to monitor closely. SLP input.   AKI on CKD stage IIIa Suspect AKI hemodynamically mediated Rapidly improving with supportive care Stop IVF-continue to hold nephrotoxic agents Follow renal function closely.  Chronic HFrEF (EF 30-35% by TEE on May 2024) S/p ICD implantation July 2024 Remains euvolemic on exam Continue to hold diuretic/GDMT medications until renal function/sepsis physiology improves a bit more-suspect can be slowly resume the next day or so.  History of CAD s/p PCI to LAD in 2022 No anginal symptoms Continue Plavix /statin   HTN Given hypotension on presentation-all antihypertensives held-patient given stress dose of IV steroids/midodrine  BP now stabilizing-midodrine /IV steroids being tapered down Suspect we can slowly resume BP medications over the next day or so.   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   History of RA Chronically on prednisone  7 mg daily which will be resumed after patient completes several days of stress dose steroids.   Hypogammaglobinemia/chronic immune deficiency On IVIG treatments per immunology as an outpatient.   DM-2 (A1C 6.0 on Oct 2024) SSI  Recent Labs    03/30/23 1633 03/30/23 2106 03/31/23 0741  GLUCAP 228* 127* 97    Debility/deconditioning PT/OT/SLP eval pending.  BMI: Estimated body mass index is 25.24 kg/m as calculated from the following:   Height as of this encounter: 5' (1.524 m).   Weight as of this encounter: 58.6 kg.  Code  status:   Code Status: Full Code   DVT Prophylaxis: rivaroxaban  (XARELTO ) tablet 20 mg    Family Communication:Daughter-[name] [phone] left VM 2/8   Disposition Plan: Status is: Inpatient Remains inpatient appropriate because: Severity of illness   Planned Discharge Destination:Home health   Diet: Diet Order             Diet heart healthy/carb modified Room service appropriate? Yes; Fluid consistency: Thin  Diet effective now                    MEDICATIONS: Scheduled Meds:  allopurinol   50 mg Oral Daily   arformoterol   15 mcg Nebulization BID   azelastine   2 spray Each Nare Daily   budesonide  (PULMICORT ) nebulizer solution  0.25 mg Nebulization BID   clopidogrel   75 mg Oral Daily   DULoxetine   20 mg Oral Daily   folic acid   1 mg Oral Daily   guaiFENesin   600 mg Oral BID   hydrocortisone  sod succinate (SOLU-CORTEF ) inj  50 mg Intravenous BID   insulin  aspart  0-9 Units Subcutaneous TID WC   ipratropium-albuterol   3 mL Nebulization TID   loratadine   10 mg Oral Daily   metroNIDAZOLE   500 mg Oral Q12H   midodrine   5 mg Oral BID WC   pantoprazole   40 mg Oral Daily   revefenacin   175 mcg Nebulization Daily   rivaroxaban   20 mg Oral Q breakfast   traZODone   100 mg Oral QHS   Continuous Infusions:  cefTRIAXone  (ROCEPHIN )  IV 1 g (03/30/23 1045)   sodium chloride      PRN Meds:.acetaminophen  **OR** acetaminophen , loperamide , ondansetron  **OR** ondansetron  (ZOFRAN ) IV, polyethylene glycol   I have personally reviewed following labs and imaging studies  LABORATORY DATA:   Data Review:    Recent Labs  Lab 03/26/23 1233 03/29/23 1101 03/30/23 0459 03/31/23 0508  WBC 15.2* 9.9 7.7 11.6*  HGB 11.3* 9.3* 9.3* 9.2*  HCT 34.8* 30.2* 29.8* 29.1*  PLT 196 171 165 165  MCV 104.8* 109.8* 108.8* 107.0*  MCH 34.0* 33.8 33.9 33.8  MCHC 32.5 30.8 31.2 31.6  RDW 14.7 16.7* 16.3* 16.2*  LYMPHSABS  --  1.3  --  0.9  MONOABS  --  1.5*  --  1.3*  EOSABS 30 0.2  --   0.0  BASOSABS 46 0.0  --  0.0    Recent Labs  Lab 03/26/23 1233 03/29/23 1101 03/29/23 1114 03/29/23 1442 03/29/23 1807 03/30/23 0459 03/31/23 0508  NA 140 142  --   --   --  143 143  K 4.4 4.4  --   --   --  4.9 4.5  CL 103 110  --   --   --  110 115*  CO2 24 21*  --   --   --  16* 19*  ANIONGAP  --  11  --   --   --  17* 9  GLUCOSE 132* 105*  --   --   --  252* 119*  BUN 30* 45*  --   --   --  37* 28*  CREATININE 1.90* 3.12*  --   --   --  1.87* 1.28*  AST 29 27  --   --   --  26  --   ALT 36* 24  --   --   --  25  --   ALKPHOS  --  45  --   --   --  46  --   BILITOT 1.1 0.5  --   --   --  0.5  --   ALBUMIN  --  2.7*  --   --   --  2.3*  --   CRP  --   --   --   --   --   --  4.3*  PROCALCITON  --   --   --   --  0.37 0.25 0.19  LATICACIDVEN  --   --  1.4 0.7  --   --   --   BNP  --   --   --   --   --   --  1,953.7*  MG  --   --   --   --   --   --  2.0  PHOS  --   --   --   --   --   --  3.0  CALCIUM  10.2 8.7*  --   --   --  8.3* 8.7*      Recent Labs  Lab 03/26/23 1233 03/29/23 1101 03/29/23 1114 03/29/23 1442 03/29/23 1807 03/30/23 0459 03/31/23 0508  CRP  --   --   --   --   --   --  4.3*  PROCALCITON  --   --   --   --  0.37 0.25 0.19  LATICACIDVEN  --   --  1.4 0.7  --   --   --   BNP  --   --   --   --   --   --  1,953.7*  MG  --   --   --   --   --   --  2.0  CALCIUM  10.2 8.7*  --   --   --  8.3* 8.7*    --------------------------------------------------------------------------------------------------------------- Lab Results  Component Value Date   CHOL 123 09/11/2022   HDL 56 09/11/2022   LDLCALC 39 09/11/2022   LDLDIRECT 48 08/07/2022   TRIG 223 (H) 09/11/2022   CHOLHDL 2.2 09/11/2022    Lab Results  Component Value Date   HGBA1C 6.0 (H) 11/30/2022     Micro Results Recent Results (from the past 240 hours)  Urine Culture     Status: None   Collection Time: 03/26/23  3:58 PM   Specimen: Urine  Result Value Ref Range Status    MICRO NUMBER: [id number]  Final   SPECIMEN QUALITY: Adequate  Final   Sample Source URINE  Final   STATUS: FINAL  Final   Result:   Final    Mixed genital flora isolated. These superficial bacteria are not indicative of a urinary tract infection. No further organism identification is warranted on this specimen. If clinically indicated, recollect clean-catch, mid-stream urine and transfer  immediately to Urine Culture Transport Tube.   Resp panel by RT-PCR (RSV, Flu A&B, Covid) Anterior Nasal Swab     Status: None   Collection Time: 03/29/23 11:01 AM   Specimen: Anterior Nasal Swab  Result Value Ref Range Status   SARS Coronavirus 2 by RT PCR NEGATIVE NEGATIVE Final   Influenza A by PCR NEGATIVE NEGATIVE Final   Influenza B by PCR NEGATIVE NEGATIVE Final    Comment: (NOTE) The Xpert Xpress SARS-CoV-2/FLU/RSV plus assay is intended as an aid in the diagnosis of influenza from Nasopharyngeal swab specimens and should not be used as a sole basis for treatment. Nasal washings and aspirates are unacceptable for Xpert Xpress SARS-CoV-2/FLU/RSV testing.  Fact Sheet for Patients: bloggercourse.com  Fact Sheet for Healthcare Providers: seriousbroker.it  This test is not yet approved or cleared by the [place]  FDA and has been authorized for detection and/or diagnosis of SARS-CoV-2 by FDA under an Emergency Use Authorization (EUA). This EUA will remain in effect (meaning this test can be used) for the duration of the COVID-19 declaration under Section 564(b)(1) of the Act, 21 U.S.C. section 360bbb-3(b)(1), unless the authorization is terminated or revoked.     Resp Syncytial Virus by PCR NEGATIVE NEGATIVE Final    Comment: (NOTE) Fact Sheet for Patients: bloggercourse.com  Fact Sheet for Healthcare Providers: seriousbroker.it  This test is not yet approved or cleared by the [place]  [place] FDA and has been authorized for detection and/or diagnosis of SARS-CoV-2 by FDA under an Emergency Use Authorization (EUA). This EUA will remain in effect (meaning this test can be used) for the duration of the COVID-19 declaration under Section 564(b)(1) of the Act, 21 U.S.C. section 360bbb-3(b)(1), unless the authorization is terminated or revoked.  Performed at [facility], [address], [place], [place] [address]   Culture, blood (routine x 2)     Status: None (Preliminary result)   Collection Time: 03/29/23 11:07 AM   Specimen: BLOOD  Result Value Ref Range Status   Specimen Description BLOOD SITE NOT SPECIFIED  Final   Special Requests   Final    BOTTLES DRAWN AEROBIC AND ANAEROBIC Blood Culture adequate volume   Culture   Final    NO GROWTH 2 DAYS Performed at [facility], [address], [place], [place] [address]    Report Status PENDING  Incomplete  Culture, blood (routine x 2)     Status: None (Preliminary result)   Collection Time: 03/29/23 11:30 AM   Specimen: BLOOD RIGHT HAND  Result Value Ref Range Status   Specimen Description BLOOD RIGHT HAND  Final   Special Requests   Final    BOTTLES DRAWN AEROBIC AND ANAEROBIC Blood Culture results may not be optimal due to an inadequate volume of blood received in culture bottles   Culture   Final    NO GROWTH 2 DAYS Performed at [facility], [address] [place], [place] [address]    Report Status PENDING  Incomplete  MRSA Next Gen by PCR, Nasal     Status: None   Collection Time: 03/29/23  4:28 PM   Specimen: Nasal Mucosa; Nasal Swab  Result Value Ref Range Status   MRSA by PCR Next Gen NOT DETECTED NOT DETECTED Final    Comment: (NOTE) The GeneXpert MRSA Assay (FDA approved for NASAL specimens only), is one component of a comprehensive MRSA colonization surveillance program. It is not intended to diagnose MRSA infection nor to guide or monitor treatment for MRSA infections. Test  performance is not FDA approved in patients less than 29 years old. Performed at [facility], [address] [place], [place] [address]   Respiratory (~20 pathogens) panel by PCR     Status: None   Collection Time: 03/29/23  5:32 PM   Specimen: Nasopharyngeal Swab; Respiratory  Result Value Ref Range Status   Adenovirus NOT DETECTED NOT DETECTED Final   Coronavirus 229E NOT DETECTED NOT DETECTED Final    Comment: (NOTE) The Coronavirus on the Respiratory Panel, DOES NOT test for the novel  Coronavirus (2019 nCoV)    Coronavirus HKU1 NOT DETECTED NOT DETECTED Final   Coronavirus NL63 NOT DETECTED NOT  DETECTED Final   Coronavirus OC43 NOT DETECTED NOT DETECTED Final   Metapneumovirus NOT DETECTED NOT DETECTED Final   Rhinovirus / Enterovirus NOT DETECTED NOT DETECTED Final   Influenza A NOT DETECTED NOT DETECTED Final   Influenza B NOT DETECTED NOT DETECTED Final   Parainfluenza Virus 1 NOT DETECTED NOT DETECTED Final   Parainfluenza Virus 2 NOT DETECTED NOT DETECTED Final   Parainfluenza Virus 3 NOT DETECTED NOT DETECTED Final   Parainfluenza Virus 4 NOT DETECTED NOT DETECTED Final   Respiratory Syncytial Virus NOT DETECTED NOT DETECTED Final   Bordetella pertussis NOT DETECTED NOT DETECTED Final   Bordetella Parapertussis NOT DETECTED NOT DETECTED Final   Chlamydophila pneumoniae NOT DETECTED NOT DETECTED Final   Mycoplasma pneumoniae NOT DETECTED NOT DETECTED Final    Comment: Performed at [facility], [address] [place], [place] [address]    Radiology Reports DG Chest [place] 1 View Result Date: 03/31/2023 CLINICAL DATA:  [id number].  Shortness of breath. EXAM: PORTABLE CHEST 1 VIEW COMPARISON:  Chest CT and portable chest both 03/29/2023 FINDINGS: 7:02 a.m. Patchy airspace disease in the right lower lobe is not significantly changed. Remaining lungs are clear of focal infiltrates with asymmetric elevation right hemidiaphragm. Left chest single lead ICD and wire  insertion and right shoulder reverse arthroplasty are again shown. Heart size and vascular pattern are normal. The mediastinum is normally outlined. Osteopenia. IMPRESSION: Patchy airspace disease in the right lower lobe is not significantly changed. No new findings. Electronically Signed   By: Francis Quam M.D.   On: 03/31/2023 07:48   CT CHEST ABDOMEN PELVIS WO CONTRAST Result Date: 03/29/2023 CLINICAL DATA:  Sepsis, weakness, uncontrollable coughing, history of breast cancer * Tracking Code: BO * EXAM: CT CHEST, ABDOMEN AND PELVIS WITHOUT CONTRAST TECHNIQUE: Multidetector CT imaging of the chest, abdomen and pelvis was performed following the standard protocol without IV contrast. RADIATION DOSE REDUCTION: This exam was performed according to the departmental dose-optimization program which includes automated exposure control, adjustment of the mA and/or kV according to patient size and/or use of iterative reconstruction technique. COMPARISON:  CT abdomen pelvis, 11/29/2022 FINDINGS: CT CHEST FINDINGS Cardiovascular: Aortic atherosclerosis. Left chest multi lead pacer. Cardiomegaly. Three-vessel coronary artery calcifications and stents. No pericardial effusion. Mediastinum/Nodes: No enlarged mediastinal, hilar, or axillary lymph nodes. Small hiatal hernia. Thyroid  gland, trachea, and esophagus demonstrate no significant findings. Lungs/Pleura: Heterogeneous airspace opacity and consolidation throughout the right lung base, evaluation of the lungs generally very limited by extensive breath motion artifact (series 4, image 82). No pleural effusion or pneumothorax. Musculoskeletal: Evidence of prior left lumpectomy with dystrophic calcification (series 3, image 28). No acute osseous findings. CT ABDOMEN PELVIS FINDINGS Hepatobiliary: No focal liver abnormality is seen. Status post cholecystectomy. No biliary dilatation. Pancreas: Unremarkable. No pancreatic ductal dilatation or surrounding inflammatory changes.  Spleen: Normal in size without significant abnormality. Adrenals/Urinary Tract: Adrenal glands are unremarkable. Kidneys are normal, without renal calculi, solid lesion, or hydronephrosis. Bladder is unremarkable. Stomach/Bowel: Stomach is within normal limits. Appendix appears normal. No evidence of bowel wall thickening, distention, or inflammatory changes. Vascular/Lymphatic: Aortic atherosclerosis. No enlarged abdominal or pelvic lymph nodes. Reproductive: Status post hysterectomy. Other: No abdominal wall hernia or abnormality. No ascites. Musculoskeletal: No acute osseous findings. IMPRESSION: 1. Heterogeneous airspace opacity and consolidation throughout the right lung base, evaluation of the lungs generally very limited by extensive breath motion artifact. Findings are consistent with infection or aspiration. 2. No noncontrast CT evidence of lymphadenopathy or metastatic disease  in the chest, abdomen, or pelvis. 3. Evidence of prior left lumpectomy with dystrophic calcification. 4. Cardiomegaly and coronary artery disease. Aortic Atherosclerosis (ICD10-I70.0). Electronically Signed   By: Marolyn JONETTA Jaksch M.D.   On: 03/29/2023 16:33   DG Chest Portable 1 View Result Date: 03/29/2023 CLINICAL DATA:  Shortness of breath and generalized weakness with productive cough and nausea EXAM: PORTABLE CHEST 1 VIEW COMPARISON:  Radiograph 12/01/2022 FINDINGS: Stable cardiomediastinal silhouette given patient rotation. Elevated right hemidiaphragm. Bibasilar atelectasis or infiltrate. No pleural effusion or pneumothorax. Left chest wall ICD. Right reverse TSA. Cervical spine fusion hardware. IMPRESSION: Bibasilar atelectasis or infiltrates. Electronically Signed   By: Norman Gatlin M.D.   On: 03/29/2023 13:15   CUP PACEART INCLINIC DEVICE CHECK Result Date: 03/05/2023 Normal in-clinic ICD check. Thresholds, sensing, and impedance WNL or stable for patient over time. No episodes. Estimated longevity 9.6-10 years .  CorVue stable. Output changed from acute to chronic per clinic standart. Pt enrolled in remote follow-up. Shock plan reviewed, pt given card.  CUP PACEART REMOTE DEVICE CHECK Result Date: 02/27/2023 Scheduled remote reviewed. Normal device function.  HF diagnostics have been abnormal this monitoring period. Next remote 91 days. - CS, CVRS    Signature  -   Lavada Stank M.D on 03/31/2023 at 10:51 AM   -  To page go to [email or web address]

## 2023-04-01 ENCOUNTER — Other Ambulatory Visit (HOSPITAL_COMMUNITY): Payer: Self-pay

## 2023-04-01 DIAGNOSIS — J189 Pneumonia, unspecified organism: Secondary | ICD-10-CM | POA: Diagnosis not present

## 2023-04-01 LAB — CBC WITH DIFFERENTIAL/PLATELET
Abs Immature Granulocytes: 0.17 10*3/uL — ABNORMAL HIGH (ref 0.00–0.07)
Basophils Absolute: 0 10*3/uL (ref 0.0–0.1)
Basophils Relative: 0 %
Eosinophils Absolute: 0 10*3/uL (ref 0.0–0.5)
Eosinophils Relative: 0 %
HCT: 27.1 % — ABNORMAL LOW (ref 36.0–46.0)
Hemoglobin: 8.6 g/dL — ABNORMAL LOW (ref 12.0–15.0)
Immature Granulocytes: 2 %
Lymphocytes Relative: 6 %
Lymphs Abs: 0.6 10*3/uL — ABNORMAL LOW (ref 0.7–4.0)
MCH: 33.6 pg (ref 26.0–34.0)
MCHC: 31.7 g/dL (ref 30.0–36.0)
MCV: 105.9 fL — ABNORMAL HIGH (ref 80.0–100.0)
Monocytes Absolute: 0.8 10*3/uL (ref 0.1–1.0)
Monocytes Relative: 8 %
Neutro Abs: 9.1 10*3/uL — ABNORMAL HIGH (ref 1.7–7.7)
Neutrophils Relative %: 84 %
Platelets: 160 10*3/uL (ref 150–400)
RBC: 2.56 MIL/uL — ABNORMAL LOW (ref 3.87–5.11)
RDW: 16.3 % — ABNORMAL HIGH (ref 11.5–15.5)
WBC: 10.8 10*3/uL — ABNORMAL HIGH (ref 4.0–10.5)
nRBC: 0 % (ref 0.0–0.2)

## 2023-04-01 LAB — BASIC METABOLIC PANEL
Anion gap: 16 — ABNORMAL HIGH (ref 5–15)
BUN: 26 mg/dL — ABNORMAL HIGH (ref 8–23)
CO2: 21 mmol/L — ABNORMAL LOW (ref 22–32)
Calcium: 9.4 mg/dL (ref 8.9–10.3)
Chloride: 107 mmol/L (ref 98–111)
Creatinine, Ser: 1.33 mg/dL — ABNORMAL HIGH (ref 0.44–1.00)
GFR, Estimated: 42 mL/min — ABNORMAL LOW (ref >60)
Glucose, Bld: 114 mg/dL — ABNORMAL HIGH (ref 70–99)
Potassium: 4.6 mmol/L (ref 3.5–5.1)
Sodium: 144 mmol/L (ref 135–145)

## 2023-04-01 LAB — BRAIN NATRIURETIC PEPTIDE: B Natriuretic Peptide: 1916.4 pg/mL — ABNORMAL HIGH (ref 0.0–100.0)

## 2023-04-01 LAB — PROCALCITONIN: Procalcitonin: 0.19 ng/mL

## 2023-04-01 LAB — GLUCOSE, CAPILLARY
Glucose-Capillary: 100 mg/dL — ABNORMAL HIGH (ref 70–99)
Glucose-Capillary: 127 mg/dL — ABNORMAL HIGH (ref 70–99)
Glucose-Capillary: 105 mg/dL — ABNORMAL HIGH (ref 70–99)

## 2023-04-01 LAB — MAGNESIUM: Magnesium: 1.6 mg/dL — ABNORMAL LOW (ref 1.7–2.4)

## 2023-04-01 LAB — PHOSPHORUS: Phosphorus: 3.8 mg/dL (ref 2.5–4.6)

## 2023-04-01 LAB — C-REACTIVE PROTEIN: CRP: 2.1 mg/dL — ABNORMAL HIGH (ref <1.0)

## 2023-04-01 MED ORDER — IPRATROPIUM-ALBUTEROL 0.5-2.5 (3) MG/3ML IN SOLN: 3.0000 mL | Freq: Two times a day (BID) | RESPIRATORY_TRACT | Status: DC

## 2023-04-01 MED ORDER — CEPHALEXIN 500 MG PO CAPS
500.0000 mg | ORAL_CAPSULE | Freq: Three times a day (TID) | ORAL | 0 refills | Status: AC
  Filled 2023-04-01: qty 12, 4d supply, fill #0

## 2023-04-01 MED ORDER — FUROSEMIDE 40 MG PO TABS
40.0000 mg | ORAL_TABLET | Freq: Once | ORAL | Status: AC
  Administered 2023-04-01: 40 mg via ORAL
  Filled 2023-04-01: qty 1

## 2023-04-01 MED ORDER — MAGNESIUM SULFATE 4 GM/100ML IV SOLN
4.0000 g | Freq: Once | INTRAVENOUS | Status: AC
  Administered 2023-04-01: 4 g via INTRAVENOUS
  Filled 2023-04-01: qty 100

## 2023-04-01 MED ORDER — METRONIDAZOLE 500 MG PO TABS
500.0000 mg | ORAL_TABLET | Freq: Three times a day (TID) | ORAL | 0 refills | Status: AC
  Filled 2023-04-01: qty 12, 4d supply, fill #0

## 2023-04-01 MED ORDER — HYDROCORTISONE SOD SUC (PF) 100 MG IJ SOLR
50.0000 mg | Freq: Every day | INTRAMUSCULAR | Status: DC
  Administered 2023-04-01: 50 mg via INTRAVENOUS
  Filled 2023-04-01: qty 2

## 2023-04-01 NOTE — Discharge Summary (Signed)
 Ebony Scott UJW:[id number] DOB: November 09, 1950 DOA: 03/29/2023  PCP: Austine Lefort, MD  Admit date: 03/29/2023  Discharge date: 04/01/2023  Admitted From: Home   Disposition:  Home   Recommendations for Outpatient Follow-up:   Follow up with PCP in 1-2 weeks  PCP Please obtain BMP/CBC, 2 view CXR in 1week,  (see Discharge instructions)   PCP Please follow up on the following pending results:    Home Health: PT   Equipment/Devices: as below  Consultations: None  Discharge Condition: Stable    CODE STATUS: Full    Diet Recommendation: Heart Healthy Low Carb, 1.5 L fluid restriction per day    Chief Complaint  Patient presents with   Weakness     Brief history of present illness from the day of admission and additional interim summary    73 y.o.  female with history of immunosuppression (chronic prednisone  for RA/weekly IVIG for hypogammaglobulinemia)-chronic HFrEF-s/p ICD, CAD, bronchiectasis/asthma with chronic hypoxic respiratory failure on nocturnal oxygen -admitted on 2/7 for severe sepsis with AKI due to PNA.   Significant events: 2/4>> fever/cough-seen by PCP-influenza PCR negative-started on Zithromax /prednisone . 2/7>> weakness/fever/cough-to ED-found to be hypotensive-AKI-pneumonia on CT imaging-admit to [facility].   Significant studies: 2/7>> CT chest/abdomen/pelvis: Multifocal pneumonia-right lung.   Significant microbiology data: 2/4>> influenza A/B PCR: Negative 2/7>> COVID/influenza/RSV PCR: Negative 2/7>> respiratory virus panel: Negative 2/7>> blood culture: No growth.                                                                 Hospital Course   Severe sepsis likely secondary to PNA-?aspiration  most likely secondary to nausea vomiting she had prior to admission, possibly aspirated some,  was placed on broad-spectrum IV antibiotics along with midodrine  and stress dose steroids, supplemental oxygen  was increased and she was provided supportive care.  She responded very well to the treatment, now transition to oral antibiotics and oral prednisone  which she takes at home, currently on 1 L nasal cannula oxygen  at rest uses between 2 and 3 L as needed at home, she is currently symptom-free eager to go home.  Will be placed on 3 more days of oral antibiotics, she has been cleared by speech therapy, has no ongoing aspiration.  Follow-up with PCP within a week for follow-up.  AKI on CKD stage IIIa Suspect AKI hemodynamically mediated Back to baseline.   Chronic HFrEF (EF 30-35% by TEE on May 2024) S/p ICD implantation July 2024 Remains euvolemic on exam Continue home medications unchanged.   History of CAD s/p PCI to LAD in 2022 No anginal symptoms Continue Plavix /statin   HTN Blood pressure stable continue home medications.   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   Hypomagnesemia.  Replaced.    History of RA Chronically on prednisone  7 mg daily which will be resumed upon discharge.   Hypogammaglobinemia/chronic immune deficiency On IVIG treatments per immunology as an outpatient.   Debility/deconditioning PT/OT/SLP eval pending.  DM-2 (A1C 6.0 on Oct 2024) Home regimen.  PCP to monitor.  Discharge diagnosis     Principal Problem:   PNA (pneumonia) Active Problems:   Rheumatoid arthritis (HCC)   Hypogammaglobulinemia (HCC)   Chronic systolic CHF (congestive heart failure) (HCC)   DMII (diabetes mellitus, type 2) (HCC)   GERD   Severe persistent asthma   Pulmonary embolism [facility])    Discharge instructions    Discharge Instructions     Discharge  instructions   Complete by: As directed    Follow with Primary MD Austine Lefort, MD in 7 days   Get CBC, CMP, Magnesium , 2 view Chest X ray -  checked next visit with your primary MD   Activity: As tolerated with Full fall precautions use walker/cane & assistance as needed  Disposition Home   Diet: Heart Healthy low carbohydrate diet, 1.5 L fluid restriction per day.  Special Instructions: If you have smoked or chewed Tobacco  in the last 2 yrs please stop smoking, stop any regular Alcohol   and or any Recreational drug use.  On your next visit with your primary care physician please Get Medicines reviewed and adjusted.  Please request your Prim.MD to go over all Hospital Tests and Procedure/Radiological results at the follow up, please get all Hospital records sent to your Prim MD by signing hospital release before you go home.  If you experience worsening of your admission symptoms, develop shortness of breath, life threatening emergency, suicidal or homicidal thoughts you must seek medical attention immediately by calling 911 or calling your MD immediately  if symptoms less severe.  You Must read complete instructions/literature along with all the possible adverse reactions/side effects for all the Medicines you take and that have been prescribed to you. Take any new Medicines after you have completely understood and accpet all the possible adverse reactions/side effects.   Do not drive when taking Pain medications.  Do not take more than prescribed Pain, Sleep and Anxiety Medications   For home use only DME Walker   Complete by: As directed    Patient needs a walker to treat with the following condition: Weakness   Increase activity slowly   Complete by: As directed        Discharge Medications   Allergies as of 04/01/2023       Reactions   Baclofen  Anaphylaxis, Other (See Comments)   Altered mental status requiring a 3-day hospital stay- patient became unresponsive "Put  me in a coma for five days", Altered mental status requiring a 3-day hospital stay- patient became unresponsive   Other Shortness Of Breath, Other (See Comments)   Grass and weeds "sneezing; filled sinuses" (02/20/2012)   Pork-derived Products Anaphylaxis   Shellfish Allergy  Anaphylaxis, Shortness Of Breath, Itching, Rash   Shrimp Extract Anaphylaxis, Other (See Comments)   ALL SHELLFISH   Tetracycline Hcl Nausea And Vomiting   Xolair  [omalizumab ] Other (See Comments)   Caused Blood clot   Zoledronic  Acid Other (See Comments)   Reclast - Fever, Put in hospital, dr said it was a reaction from a reaction  Fever, inability to walk, Put in hospital Fever, Put in hospital, dr said it was a reaction from a reaction ,  Other reaction(s): Unknown   Celecoxib Swelling   Feet swelling   Dilaudid  [hydromorphone  Hcl] Itching   Hydrocodone -acetaminophen  Itching   Levofloxacin  Other (See Comments)   GI upset   Molds & Smuts Cough   Morphine  And Codeine Hives, Itching   Oxycodone  Hcl Nausea And Vomiting   GI Intolerance   Paroxetine Nausea And Vomiting, Other (See Comments)   GI Intolerance also   Penicillins Rash, Other (See Comments)   "welts" (02/20/2012) Tolerated Ancef  02/16/20   Tetracycline Hcl Other (See Comments)   GI Intolerance   Oxycodone -acetaminophen  Itching   Diltiazem  Hcl Swelling   Dust Mite Extract Other (See Comments), Cough   "sneezing" (02/20/2012) too   Gamunex [immune Globulin ] Itching, Rash   Tolerates hizentra     Penicillin G Procaine Rash, Other (See Comments)   Welts, also   Rituximab  Other (See Comments)   Anemia    Rofecoxib Swelling   Vioxx- feet swelling   Tree Extract Itching   "tested and told I was allergic to it; never experienced a reaction to it" (02/20/2012)        Medication List     STOP taking these medications    azithromycin  250 MG tablet Commonly known as: Zithromax  Z-Pak       TAKE these medications    acetaminophen  500 MG  tablet Commonly known as: TYLENOL  Take 1,000 mg by mouth as needed for moderate pain.   albuterol  108 (90 Base) MCG/ACT inhaler Commonly known as: VENTOLIN  HFA Inhale 2 puffs into the lungs every 6 (six) hours as needed for wheezing or shortness of breath.   albuterol  (2.5 MG/3ML) 0.083% nebulizer solution Commonly known as: PROVENTIL  Take 3 mLs (2.5 mg total) by nebulization every 4 (four) hours as needed for wheezing or shortness of breath.   Align 4 MG Caps Take 4 mg by mouth every evening.   allopurinol  100 MG tablet Commonly known as: ZYLOPRIM  Take 1 tablet (100 mg total) by mouth daily.   Alpha-Lipoic Acid 600 MG Tabs Take 600 mg by mouth in the morning.   atorvastatin  80 MG tablet Commonly known as: LIPITOR Take 1 tablet (80 mg total) by mouth every evening.   azelastine  0.1 % nasal spray Commonly known as: ASTELIN  Place 2 sprays into both nostrils daily. Use in each nostril as directed What changed:  when to take this reasons to take this   BEANO PO Take 2-3 tablets by mouth 3 (three) times daily as needed (Gas).   cephALEXin  500 MG capsule Commonly known as: KEFLEX  Take 1 capsule (500 mg total) by mouth 3 (three) times daily for 5 days.   cetirizine  10 MG tablet Commonly known as: ZYRTEC  Take 1 tablet (10 mg total) by mouth 2 (two) times daily as needed for allergies (Can take a n extra dose during flare ups.). What changed: when to take this   clopidogrel  75 MG tablet Commonly known as: PLAVIX  Take 1 tablet (75 mg total) by mouth daily.   cyproheptadine  4 MG tablet Commonly known as: PERIACTIN  Take 3 tablets (12 mg total) by mouth at bedtime. What changed: how much to take   denosumab  60 MG/ML Sosy injection Commonly known as: PROLIA  Inject 60 mg into the skin every 6 (six) months.   dexlansoprazole  60 MG capsule Commonly known as: Dexilant  Take 1 capsule (60 mg total) by mouth in the morning.   empagliflozin  10 MG Tabs tablet Commonly known  as: Jardiance  Take 1 tablet (10 mg total) by mouth daily.  EPINEPHrine  0.3 mg/0.3 mL Soaj injection Commonly known as: EPI-PEN USE AS DIRECTED BY YOUR PHYSICIAN INTRAMUSCULARLY AS NEEDED FOR ANAPHYLAXIS   famotidine  20 MG tablet Commonly known as: PEPCID  Take 1 tablet (20 mg total) by mouth every morning. TAKE 1 TABLET BY MOUTH EACH NIGHT AT BEDTIME Strength: 20 mg   folic acid  1 MG tablet Commonly known as: FOLVITE  Take 1 tablet (1 mg total) by mouth daily.   furosemide  40 MG tablet Commonly known as: LASIX  Take 40 mg by mouth 2 (two) times a week. Monday and Thursday   gabapentin  300 MG capsule Commonly known as: NEURONTIN  Take 1 capsule (300 mg total) by mouth at bedtime.   guaiFENesin  600 MG 12 hr tablet Commonly known as: MUCINEX  Take 600 mg by mouth 2 (two) times daily.   Hizentra  1 GM/5ML Soln Generic drug: Immune Globulin  (Human) Inject 1 g into the skin.   Hizentra  10 GM/50ML Soln Generic drug: Immune Globulin  (Human) Inject 15 g into the skin once a week. 13 g Infusion   ipratropium-albuterol  0.5-2.5 (3) MG/3ML Soln Commonly known as: DUONEB Take 3 mLs by nebulization every 6 (six) hours as needed.   isosorbide -hydrALAZINE  20-37.5 MG tablet Commonly known as: BIDIL  Take 1 tablet by mouth 2 (two) times daily.   LACTOSE FAST ACTING RELIEF PO Take 2-3 tablets by mouth 3 (three) times daily before meals. For dairy food or drink   magnesium  oxide 400 (240 Mg) MG tablet Commonly known as: MAG-OX Take 400 mg by mouth at bedtime.   metroNIDAZOLE  500 MG tablet Commonly known as: Flagyl  Take 1 tablet (500 mg total) by mouth 3 (three) times daily for 7 days.   montelukast  10 MG tablet Commonly known as: SINGULAIR  Take 1 tablet (10 mg total) by mouth daily after supper.   mupirocin  ointment 2 % Commonly known as: BACTROBAN  Apply 1 Application topically as needed (wounds).   Nebulizer Mask Adult Misc 1 Device by Does not apply route as directed.    nystatin  100000 UNIT/ML suspension Commonly known as: MYCOSTATIN  5 mls swish, gargle, swallow after Trelegy use   potassium chloride  10 MEQ tablet Commonly known as: KLOR-CON  Take 10 mEq by mouth daily.   predniSONE  5 MG tablet Commonly known as: DELTASONE  TAKE 1 TABLET (5MG ) BY MOUTH ONCE DAILY WITH BREAKFAST   predniSONE  1 MG tablet Commonly known as: DELTASONE  TAKE 2 TABLETS (2MG ) BY MOUTH ONCE DAILY WITH BREAKFAST. TAKE ALONG WITH 5MG  DAILY   PRENATAL VITAMINS PO Take 1 tablet by mouth in the morning.   rivaroxaban  20 MG Tabs tablet Commonly known as: Xarelto  Take 1 tablet (20 mg total) by mouth daily with supper.   sacubitril -valsartan  24-26 MG Commonly known as: ENTRESTO  Take 1 tablet by mouth 2 (two) times daily.   traMADol  50 MG tablet Commonly known as: ULTRAM  Take 1 tablet (50 mg total) by mouth at bedtime.   traZODone  50 MG tablet Commonly known as: DESYREL  Take 2 tablets (100 mg total) by mouth at bedtime. What changed: how much to take   Trelegy Ellipta  200-62.5-25 MCG/ACT Aepb Generic drug: Fluticasone -Umeclidin-Vilant Inhale 1 Inhalation into the lungs in the morning. What changed: when to take this   Vitamin D3 25 MCG (1000 UT) Caps Take 1,000 Units by mouth at bedtime.   Voltaren  1 % Gel Generic drug: diclofenac  Sodium Apply 1 Application topically as needed (pain).               Durable Medical Equipment  (From admission, onward)  Start     Ordered   04/01/23 0000  For home use only DME Walker       Question:  Patient needs a walker to treat with the following condition  Answer:  Weakness   04/01/23 0840             Follow-up Information     Austine Lefort, MD. Schedule an appointment as soon as possible for a visit in 1 week(s).   Specialty: Family Medicine Contact information: [address] [place] [address] [phone]                 Major procedures and Radiology Reports -  PLEASE review detailed and final reports thoroughly  -       DG Chest Port 1 View Result Date: 03/31/2023 CLINICAL DATA:  [id number].  Shortness of breath. EXAM: PORTABLE CHEST 1 VIEW COMPARISON:  Chest CT and portable chest both 03/29/2023 FINDINGS: 7:02 a.m. Patchy airspace disease in the right lower lobe is not significantly changed. Remaining lungs are clear of focal infiltrates with asymmetric elevation right hemidiaphragm. Left chest single lead ICD and wire insertion and right shoulder reverse arthroplasty are again shown. Heart size and vascular pattern are normal. The mediastinum is normally outlined. Osteopenia. IMPRESSION: Patchy airspace disease in the right lower lobe is not significantly changed. No new findings. Electronically Signed   By: Denman Fischer M.D.   On: 03/31/2023 07:48   CT CHEST ABDOMEN PELVIS WO CONTRAST Result Date: 03/29/2023 CLINICAL DATA:  Sepsis, weakness, uncontrollable coughing, history of breast cancer * Tracking Code: BO * EXAM: CT CHEST, ABDOMEN AND PELVIS WITHOUT CONTRAST TECHNIQUE: Multidetector CT imaging of the chest, abdomen and pelvis was performed following the standard protocol without IV contrast. RADIATION DOSE REDUCTION: This exam was performed according to the departmental dose-optimization program which includes automated exposure control, adjustment of the mA and/or kV according to patient size and/or use of iterative reconstruction technique. COMPARISON:  CT abdomen pelvis, 11/29/2022 FINDINGS: CT CHEST FINDINGS Cardiovascular: Aortic atherosclerosis. Left chest multi lead pacer. Cardiomegaly. Three-vessel coronary artery calcifications and stents. No pericardial effusion. Mediastinum/Nodes: No enlarged mediastinal, hilar, or axillary lymph nodes. Small hiatal hernia. Thyroid  gland, trachea, and esophagus demonstrate no significant findings. Lungs/Pleura: Heterogeneous airspace opacity and consolidation throughout the right lung base, evaluation of the lungs  generally very limited by extensive breath motion artifact (series 4, image 82). No pleural effusion or pneumothorax. Musculoskeletal: Evidence of prior left lumpectomy with dystrophic calcification (series 3, image 28). No acute osseous findings. CT ABDOMEN PELVIS FINDINGS Hepatobiliary: No focal liver abnormality is seen. Status post cholecystectomy. No biliary dilatation. Pancreas: Unremarkable. No pancreatic ductal dilatation or surrounding inflammatory changes. Spleen: Normal in size without significant abnormality. Adrenals/Urinary Tract: Adrenal glands are unremarkable. Kidneys are normal, without renal calculi, solid lesion, or hydronephrosis. Bladder is unremarkable. Stomach/Bowel: Stomach is within normal limits. Appendix appears normal. No evidence of bowel wall thickening, distention, or inflammatory changes. Vascular/Lymphatic: Aortic atherosclerosis. No enlarged abdominal or pelvic lymph nodes. Reproductive: Status post hysterectomy. Other: No abdominal wall hernia or abnormality. No ascites. Musculoskeletal: No acute osseous findings. IMPRESSION: 1. Heterogeneous airspace opacity and consolidation throughout the right lung base, evaluation of the lungs generally very limited by extensive breath motion artifact. Findings are consistent with infection or aspiration. 2. No noncontrast CT evidence of lymphadenopathy or metastatic disease in the chest, abdomen, or pelvis. 3. Evidence of prior left lumpectomy with dystrophic calcification. 4. Cardiomegaly and coronary artery disease. Aortic Atherosclerosis (  ICD10-I70.0). Electronically Signed   By: Fredricka Jenny M.D.   On: 03/29/2023 16:33   DG Chest Portable 1 View Result Date: 03/29/2023 CLINICAL DATA:  Shortness of breath and generalized weakness with productive cough and nausea EXAM: PORTABLE CHEST 1 VIEW COMPARISON:  Radiograph 12/01/2022 FINDINGS: Stable cardiomediastinal silhouette given patient rotation. Elevated right hemidiaphragm. Bibasilar  atelectasis or infiltrate. No pleural effusion or pneumothorax. Left chest wall ICD. Right reverse TSA. Cervical spine fusion hardware. IMPRESSION: Bibasilar atelectasis or infiltrates. Electronically Signed   By: Rozell Cornet M.D.   On: 03/29/2023 13:15   CUP PACEART INCLINIC DEVICE CHECK Result Date: 03/05/2023 Normal in-clinic ICD check. Thresholds, sensing, and impedance WNL or stable for patient over time. No episodes. Estimated longevity 9.6-10 years . CorVue stable. Output changed from acute to chronic per clinic standart. Pt enrolled in remote follow-up. Shock plan reviewed, pt given card.   Micro Results    Recent Results (from the past 240 hours)  Urine Culture     Status: None   Collection Time: 03/26/23  3:58 PM   Specimen: Urine  Result Value Ref Range Status   MICRO NUMBER: [id number]  Final   SPECIMEN QUALITY: Adequate  Final   Sample Source URINE  Final   STATUS: FINAL  Final   Result:   Final    Mixed genital flora isolated. These superficial bacteria are not indicative of a urinary tract infection. No further organism identification is warranted on this specimen. If clinically indicated, recollect clean-catch, mid-stream urine and transfer  immediately to Urine Culture Transport Tube.   Resp panel by RT-PCR (RSV, Flu A&B, Covid) Anterior Nasal Swab     Status: None   Collection Time: 03/29/23 11:01 AM   Specimen: Anterior Nasal Swab  Result Value Ref Range Status   SARS Coronavirus 2 by RT PCR NEGATIVE NEGATIVE Final   Influenza A by PCR NEGATIVE NEGATIVE Final   Influenza B by PCR NEGATIVE NEGATIVE Final    Comment: (NOTE) The Xpert Xpress SARS-CoV-2/FLU/RSV plus assay is intended as an aid in the diagnosis of influenza from Nasopharyngeal swab specimens and should not be used as a sole basis for treatment. Nasal washings and aspirates are unacceptable for Xpert Xpress SARS-CoV-2/FLU/RSV testing.  Fact Sheet for  Patients: BloggerCourse.com  Fact Sheet for Healthcare Providers: SeriousBroker.it  This test is not yet approved or cleared by the [place]  FDA and has been authorized for detection and/or diagnosis of SARS-CoV-2 by FDA under an Emergency Use Authorization (EUA). This EUA will remain in effect (meaning this test can be used) for the duration of the COVID-19 declaration under Section 564(b)(1) of the Act, 21 U.S.C. section 360bbb-3(b)(1), unless the authorization is terminated or revoked.     Resp Syncytial Virus by PCR NEGATIVE NEGATIVE Final    Comment: (NOTE) Fact Sheet for Patients: BloggerCourse.com  Fact Sheet for Healthcare Providers: SeriousBroker.it  This test is not yet approved or cleared by the [place]  FDA and has been authorized for detection and/or diagnosis of SARS-CoV-2 by FDA under an Emergency Use Authorization (EUA). This EUA will remain in effect (meaning this test can be used) for the duration of the COVID-19 declaration under Section 564(b)(1) of the Act, 21 U.S.C. section 360bbb-3(b)(1), unless the authorization is terminated or revoked.  Performed at [facility], [address], [place], [place] [address]   Culture, blood (routine x 2)     Status: None (Preliminary result)   Collection Time: 03/29/23 11:07 AM  Specimen: BLOOD  Result Value Ref Range Status   Specimen Description BLOOD SITE NOT SPECIFIED  Final   Special Requests   Final    BOTTLES DRAWN AEROBIC AND ANAEROBIC Blood Culture adequate volume   Culture   Final    NO GROWTH 2 DAYS Performed at [facility], [address] [place], [place] [address]    Report Status PENDING  Incomplete  Culture, blood (routine x 2)     Status: None (Preliminary result)   Collection Time: 03/29/23 11:30 AM   Specimen: BLOOD RIGHT HAND  Result Value Ref Range Status   Specimen  Description BLOOD RIGHT HAND  Final   Special Requests   Final    BOTTLES DRAWN AEROBIC AND ANAEROBIC Blood Culture results may not be optimal due to an inadequate volume of blood received in culture bottles   Culture   Final    NO GROWTH 2 DAYS Performed at [facility], [address], [place], [place] [address]    Report Status PENDING  Incomplete  MRSA Next Gen by PCR, Nasal     Status: None   Collection Time: 03/29/23  4:28 PM   Specimen: Nasal Mucosa; Nasal Swab  Result Value Ref Range Status   MRSA by PCR Next Gen NOT DETECTED NOT DETECTED Final    Comment: (NOTE) The GeneXpert MRSA Assay (FDA approved for NASAL specimens only), is one component of a comprehensive MRSA colonization surveillance program. It is not intended to diagnose MRSA infection nor to guide or monitor treatment for MRSA infections. Test performance is not FDA approved in patients less than 80 years old. Performed at [facility], [address] [place], [place] [address]   Respiratory (~20 pathogens) panel by PCR     Status: None   Collection Time: 03/29/23  5:32 PM   Specimen: Nasopharyngeal Swab; Respiratory  Result Value Ref Range Status   Adenovirus NOT DETECTED NOT DETECTED Final   Coronavirus 229E NOT DETECTED NOT DETECTED Final    Comment: (NOTE) The Coronavirus on the Respiratory Panel, DOES NOT test for the novel  Coronavirus (2019 nCoV)    Coronavirus HKU1 NOT DETECTED NOT DETECTED Final   Coronavirus NL63 NOT DETECTED NOT DETECTED Final   Coronavirus OC43 NOT DETECTED NOT DETECTED Final   Metapneumovirus NOT DETECTED NOT DETECTED Final   Rhinovirus / Enterovirus NOT DETECTED NOT DETECTED Final   Influenza A NOT DETECTED NOT DETECTED Final   Influenza B NOT DETECTED NOT DETECTED Final   Parainfluenza Virus 1 NOT DETECTED NOT DETECTED Final   Parainfluenza Virus 2 NOT DETECTED NOT DETECTED Final   Parainfluenza Virus 3 NOT DETECTED NOT DETECTED Final   Parainfluenza Virus 4  NOT DETECTED NOT DETECTED Final   Respiratory Syncytial Virus NOT DETECTED NOT DETECTED Final   Bordetella pertussis NOT DETECTED NOT DETECTED Final   Bordetella Parapertussis NOT DETECTED NOT DETECTED Final   Chlamydophila pneumoniae NOT DETECTED NOT DETECTED Final   Mycoplasma pneumoniae NOT DETECTED NOT DETECTED Final    Comment: Performed at [facility], [address] [place], [place] [address]    Today   Subjective    Aslan Crable today has no headache,no chest abdominal pain,no new weakness tingling or numbness, feels much better wants to go home today.     Objective   Blood pressure (!) 154/79, pulse 88, temperature 98.9 F (37.2 C), temperature source Oral, resp. rate 15, height 5' (1.524 m), weight 58.6 kg, SpO2 [name]  89%.   Intake/Output Summary (Last 24 hours) at 04/01/2023 0848 Last data filed at 03/31/2023 1800 Gross per 24 hour  Intake 240 ml  Output --  Net 240 ml    Exam  Awake Alert, No new F.N deficits,    [place].AT,PERRAL Supple Neck,   Symmetrical Chest wall movement, Good air movement bilaterally, CTAB RRR,No Gallops,   +ve B.Sounds, Abd Soft, Non tender,  No Cyanosis, Clubbing or edema    Data Review   Recent Labs  Lab 03/26/23 1233 03/29/23 1101 03/30/23 0459 03/31/23 0508 04/01/23 0428  WBC 15.2* 9.9 7.7 11.6* 10.8*  HGB 11.3* 9.3* 9.3* 9.2* 8.6*  HCT 34.8* 30.2* 29.8* 29.1* 27.1*  PLT 196 171 165 165 160  MCV 104.8* 109.8* 108.8* 107.0* 105.9*  MCH 34.0* 33.8 33.9 33.8 33.6  MCHC 32.5 30.8 31.2 31.6 31.7  RDW 14.7 16.7* 16.3* 16.2* 16.3*  LYMPHSABS  --  1.3  --  0.9 0.6*  MONOABS  --  1.5*  --  1.3* 0.8  EOSABS 30 0.2  --  0.0 0.0  BASOSABS 46 0.0  --  0.0 0.0    Recent Labs  Lab 03/26/23 1233 03/29/23 1101 03/29/23 1114 03/29/23 1442 03/29/23 1807 03/30/23 0459 03/31/23 0508 04/01/23 0428  NA 140 142  --   --   --  143 143 144  K 4.4 4.4  --   --   --  4.9 4.5 4.6  CL 103 110  --   --   --  110 115* 107  CO2 24 21*   --   --   --  16* 19* 21*  ANIONGAP  --  11  --   --   --  17* 9 16*  GLUCOSE 132* 105*  --   --   --  252* 119* 114*  BUN 30* 45*  --   --   --  37* 28* 26*  CREATININE 1.90* 3.12*  --   --   --  1.87* 1.28* 1.33*  AST 29 27  --   --   --  26  --   --   ALT 36* 24  --   --   --  25  --   --   ALKPHOS  --  45  --   --   --  46  --   --   BILITOT 1.1 0.5  --   --   --  0.5  --   --   ALBUMIN  --  2.7*  --   --   --  2.3*  --   --   CRP  --   --   --   --   --   --  4.3* 2.1*  PROCALCITON  --   --   --   --  0.37 0.25 0.19 0.19  LATICACIDVEN  --   --  1.4 0.7  --   --   --   --   BNP  --   --   --   --   --   --  1,953.7* 1,916.4*  MG  --   --   --   --   --   --  2.0 1.6*  PHOS  --   --   --   --   --   --  3.0 3.8  CALCIUM  10.2 8.7*  --   --   --  8.3* 8.7* 9.4    Total Time  in preparing paper work, data evaluation and todays exam - 35 minutes  Signature  -    Lynnwood Sauer M.D on 04/01/2023 at 8:48 AM   -  To page go to [email or web address]

## 2023-04-01 NOTE — Care Management Important Message (Signed)
 Important Message  Patient Details  Name: MARDELLE RADZINSKI MRN: [id number] Date of Birth: 1950-05-03   Important Message Given:  Yes - Medicare IM  Due illness patient was not able to sign. Left a copy at the patient bedside.    Elijio Staples 04/01/2023, 2:48 PM

## 2023-04-01 NOTE — TOC Transition Note (Signed)
 Transition of Care [facility]) - Discharge Note   Patient Details  Name: Ebony Scott MRN: [id number] Date of Birth: 12/23/1950  Transition of Care [facility]) CM/SW Contact:  Jannine Meo, RN Phone Number: 04/01/2023, 9:17 AM   Clinical Narrative:   Patient is being discharged today. Patient reports that she has a rolling walker at home. Discussed HH agency options, patient would like to use [facility] as she had them in the past. Referral sent to [facility] with [facility]. Contact information placed on AVS.    Final next level of care: Home w Home Health Services Barriers to Discharge: No Barriers Identified   Patient Goals and CMS Choice            Discharge Placement                       Discharge Plan and Services Additional resources added to the After Visit Summary for                  DME Arranged: Patient refused services         HH Arranged: PT HH Agency: [facility] Date [facility] Agency Contacted: 04/01/23 Time HH Agency Contacted: [phone] Representative spoke with at [facility] Agency: Imelda Man  Social Drivers of Health (SDOH) Interventions SDOH Screenings   Food Insecurity: No Food Insecurity (03/29/2023)  Housing: Low Risk  (03/29/2023)  Transportation Needs: No Transportation Needs (03/29/2023)  Utilities: Not At Risk (03/29/2023)  Alcohol  Screen: Low Risk  (11/22/2022)  Depression (PHQ2-9): Low Risk  (01/21/2023)  Financial Resource Strain: Low Risk  (03/26/2023)  Physical Activity: Inactive (03/26/2023)  Social Connections: Socially Isolated (03/29/2023)  Stress: Stress Concern Present (03/26/2023)  Tobacco Use: Low Risk  (03/29/2023)  Health Literacy: Adequate Health Literacy (11/22/2022)     Readmission Risk Interventions    12/06/2022    2:19 PM 12/03/2022    3:04 PM 04/27/2022    2:05 PM  Readmission Risk Prevention Plan  Transportation Screening Complete Complete Complete  PCP or Specialist Appt within 5-7 Days   Complete  PCP or Specialist Appt within 3-5 Days  Complete Complete   Home Care Screening   Complete  Medication Review (RN CM)   Referral to Pharmacy  HRI or Home Care Consult Complete Complete   Social Work Consult for Recovery Care Planning/Counseling Complete Complete   Palliative Care Screening Complete Complete   Medication Review [employer]) Complete Complete

## 2023-04-01 NOTE — Discharge Instructions (Signed)
 Follow with Primary MD Austine Lefort, MD in 7 days   Get CBC, CMP, Magnesium , 2 view Chest X ray -  checked next visit with your primary MD   Activity: As tolerated with Full fall precautions use walker/cane & assistance as needed  Disposition Home   Diet: Heart Healthy low carbohydrate diet, 1.5 L fluid restriction per day.  Special Instructions: If you have smoked or chewed Tobacco  in the last 2 yrs please stop smoking, stop any regular Alcohol   and or any Recreational drug use.  On your next visit with your primary care physician please Get Medicines reviewed and adjusted.  Please request your Prim.MD to go over all Hospital Tests and Procedure/Radiological results at the follow up, please get all Hospital records sent to your Prim MD by signing hospital release before you go home.  If you experience worsening of your admission symptoms, develop shortness of breath, life threatening emergency, suicidal or homicidal thoughts you must seek medical attention immediately by calling 911 or calling your MD immediately  if symptoms less severe.  You Must read complete instructions/literature along with all the possible adverse reactions/side effects for all the Medicines you take and that have been prescribed to you. Take any new Medicines after you have completely understood and accpet all the possible adverse reactions/side effects.   Do not drive when taking Pain medications.  Do not take more than prescribed Pain, Sleep and Anxiety Medications

## 2023-04-01 NOTE — Plan of Care (Signed)
  Problem: Education: Goal: Ability to describe self-care measures that may prevent or decrease complications (Diabetes Survival Skills Education) will improve Outcome: Adequate for Discharge Goal: Individualized Educational Video(s) Outcome: Adequate for Discharge   Problem: Coping: Goal: Ability to adjust to condition or change in health will improve Outcome: Adequate for Discharge   Problem: Fluid Volume: Goal: Ability to maintain a balanced intake and output will improve Outcome: Adequate for Discharge   Problem: Health Behavior/Discharge Planning: Goal: Ability to identify and utilize available resources and services will improve Outcome: Adequate for Discharge Goal: Ability to manage health-related needs will improve Outcome: Adequate for Discharge   Problem: Metabolic: Goal: Ability to maintain appropriate glucose levels will improve Outcome: Adequate for Discharge   Problem: Nutritional: Goal: Maintenance of adequate nutrition will improve Outcome: Adequate for Discharge Goal: Progress toward achieving an optimal weight will improve Outcome: Adequate for Discharge   Problem: Skin Integrity: Goal: Risk for impaired skin integrity will decrease Outcome: Adequate for Discharge   Problem: Tissue Perfusion: Goal: Adequacy of tissue perfusion will improve Outcome: Adequate for Discharge   Problem: Education: Goal: Knowledge of General Education information will improve Description: Including pain rating scale, medication(s)/side effects and non-pharmacologic comfort measures Outcome: Adequate for Discharge   Problem: Health Behavior/Discharge Planning: Goal: Ability to manage health-related needs will improve Outcome: Adequate for Discharge   Problem: Clinical Measurements: Goal: Ability to maintain clinical measurements within normal limits will improve Outcome: Adequate for Discharge Goal: Will remain free from infection Outcome: Adequate for Discharge Goal:  Diagnostic test results will improve Outcome: Adequate for Discharge Goal: Respiratory complications will improve Outcome: Adequate for Discharge Goal: Cardiovascular complication will be avoided Outcome: Adequate for Discharge   Problem: Activity: Goal: Risk for activity intolerance will decrease Outcome: Adequate for Discharge   Problem: Nutrition: Goal: Adequate nutrition will be maintained Outcome: Adequate for Discharge   Problem: Coping: Goal: Level of anxiety will decrease Outcome: Adequate for Discharge   Problem: Elimination: Goal: Will not experience complications related to bowel motility Outcome: Adequate for Discharge Goal: Will not experience complications related to urinary retention Outcome: Adequate for Discharge   Problem: Pain Managment: Goal: General experience of comfort will improve and/or be controlled Outcome: Adequate for Discharge   Problem: Safety: Goal: Ability to remain free from injury will improve Outcome: Adequate for Discharge   Problem: Skin Integrity: Goal: Risk for impaired skin integrity will decrease Outcome: Adequate for Discharge   Problem: Acute Rehab PT Goals(only PT should resolve) Goal: Pt Will Go Up/Down Stairs Outcome: Adequate for Discharge

## 2023-04-01 NOTE — Plan of Care (Signed)

## 2023-04-01 NOTE — Progress Notes (Signed)
 Explained discharge instructions to patient. Reviewed follow up appointment and next medication administration times. Also reviewed education. Patient verbalized having an understanding for instructions given. All belongings are in the patient's possession to include TOC meds. IV and telemetry were removed. CCMD was notified. No other needs verbalized. Volunteers will transport downstairs for discharge.

## 2023-04-02 ENCOUNTER — Telehealth: Payer: Self-pay | Admitting: *Deleted

## 2023-04-02 NOTE — Transitions of Care (Post Inpatient/ED Visit) (Signed)
   04/02/2023  Name: Ebony Scott MRN: [id number] DOB: Jul 16, 1950  Today's TOC FU Call Status: Today's TOC FU Call Status:: Unsuccessful Call (1st Attempt) Unsuccessful Call (1st Attempt) Date: 04/02/23  Attempted to reach the patient regarding the most recent Inpatient/ED visit.  Follow Up Plan: Additional outreach attempts will be made to reach the patient to complete the Transitions of Care (Post Inpatient/ED visit) call.   Irving Shows [facility], BSN RN Care Manager/ Transition of Care [place]/ [facility] [phone]

## 2023-04-03 ENCOUNTER — Telehealth: Payer: Self-pay | Admitting: *Deleted

## 2023-04-03 LAB — CULTURE, BLOOD (ROUTINE X 2)
Culture: NO GROWTH
Culture: NO GROWTH
Special Requests: ADEQUATE

## 2023-04-03 NOTE — Transitions of Care (Post Inpatient/ED Visit) (Signed)
   04/03/2023  Name: TENISHA FLEECE MRN: [id number] DOB: 11/04/1950  Today's TOC FU Call Status: Today's TOC FU Call Status:: Unsuccessful Call (2nd Attempt) Unsuccessful Call (2nd Attempt) Date: 04/03/23  Attempted to reach the patient regarding the most recent Inpatient/ED visit.  Follow Up Plan: Additional outreach attempts will be made to reach the patient to complete the Transitions of Care (Post Inpatient/ED visit) call.   Irving Shows [facility], BSN RN Care Manager/ Transition of Care [place]/ [facility] [phone]

## 2023-04-04 ENCOUNTER — Telehealth: Payer: Self-pay

## 2023-04-04 ENCOUNTER — Telehealth: Payer: Self-pay | Admitting: *Deleted

## 2023-04-04 DIAGNOSIS — J470 Bronchiectasis with acute lower respiratory infection: Secondary | ICD-10-CM | POA: Diagnosis not present

## 2023-04-04 DIAGNOSIS — I5022 Chronic systolic (congestive) heart failure: Secondary | ICD-10-CM | POA: Diagnosis not present

## 2023-04-04 DIAGNOSIS — R6520 Severe sepsis without septic shock: Secondary | ICD-10-CM | POA: Diagnosis not present

## 2023-04-04 DIAGNOSIS — A419 Sepsis, unspecified organism: Secondary | ICD-10-CM | POA: Diagnosis not present

## 2023-04-04 DIAGNOSIS — E1122 Type 2 diabetes mellitus with diabetic chronic kidney disease: Secondary | ICD-10-CM | POA: Diagnosis not present

## 2023-04-04 DIAGNOSIS — J189 Pneumonia, unspecified organism: Secondary | ICD-10-CM | POA: Diagnosis not present

## 2023-04-04 DIAGNOSIS — J4550 Severe persistent asthma, uncomplicated: Secondary | ICD-10-CM | POA: Diagnosis not present

## 2023-04-04 DIAGNOSIS — J9611 Chronic respiratory failure with hypoxia: Secondary | ICD-10-CM | POA: Diagnosis not present

## 2023-04-04 DIAGNOSIS — I130 Hypertensive heart and chronic kidney disease with heart failure and stage 1 through stage 4 chronic kidney disease, or unspecified chronic kidney disease: Secondary | ICD-10-CM | POA: Diagnosis not present

## 2023-04-04 NOTE — Transitions of Care (Post Inpatient/ED Visit) (Signed)
04/04/2023  Name: Ebony Scott MRN: [id number] DOB: 10-16-1950  Today's TOC FU Call Status: Today's TOC FU Call Status:: Successful TOC FU Call Completed TOC FU Call Complete Date: 04/04/23 Patient's Name and Date of Birth confirmed.  Transition Care Management Follow-up Telephone Call Date of Discharge: 04/01/23 Discharge Facility: [facility]) Type of Discharge: Inpatient Admission Primary Inpatient Discharge Diagnosis:: PNA (pneumonia) How have you been since you were released from the hospital?: Better (eating & drinking well,  ambulating without difficulty, uses walker, HH is working w/ pt) Any questions or concerns?: No  Items Reviewed: Did you receive and understand the discharge instructions provided?: Yes Medications obtained,verified, and reconciled?: Yes (Medications Reviewed) Any new allergies since your discharge?: No Dietary orders reviewed?: Yes Type of Diet Ordered:: heart healthy,  carb modified,   1.5 L   fluid restriction Do you have support at home?: Yes People in Home: child(ren), adult Name of Support/Comfort Primary Source: daughter Kasiya Burck lives w/ pt Pt declines enrollment in [facility] 30 day program, feels home health RN/ PT working with her is sufficient Medical illustrator reviewed signs/ symptoms pneumonia, infection, reportable signs/ symptoms Patient weighs daily and records Reviewed HF action plan Pt does not check CBG, states has not been instructed to  Medications Reviewed Today: Medications Reviewed Today     Reviewed by Audrie Gallus, RN (Registered Nurse) on 04/04/23 at 1602  Med List Status: <None>   Medication Order Taking? Sig Documenting Provider Last Dose Status Informant  acetaminophen (TYLENOL) 500 MG tablet [id number] Yes Take 1,000 mg by mouth as needed for moderate pain. [provider] Taking Active Self, Pharmacy Records  albuterol (PROVENTIL) (2.5 MG/3ML) 0.083% nebulizer solution [id number] No Take 3 mLs (2.5 mg total) by  nebulization every 4 (four) hours as needed for wheezing or shortness of breath.  Patient not taking: Reported on 04/04/2023   Jessica Priest, MD Not Taking Active Self, Pharmacy Records           Med Note [facility], Cassondra Stachowski A   Thu Apr 04, 2023  3:58 PM) Pt has on hand but hasn't used in awhile  albuterol (VENTOLIN HFA) 108 (90 Base) MCG/ACT inhaler [id number] Yes Inhale 2 puffs into the lungs every 6 (six) hours as needed for wheezing or shortness of breath. Kozlow, [name], MD Taking Active Self, Pharmacy Records  allopurinol (ZYLOPRIM) 100 MG tablet [id number] Yes Take 1 tablet (100 mg total) by mouth daily. [name], MD Taking Active Self, Pharmacy Records  Alpha-D-Galactosidase [facility] PO) [id number] Yes Take 2-3 tablets by mouth 3 (three) times daily as needed (Gas). [provider] Taking Active Pharmacy Records, Self  Alpha-Lipoic Acid 600 MG TABS [id number] Yes Take 600 mg by mouth in the morning. [provider] Taking Active Pharmacy Records, Self  atorvastatin (LIPITOR) 80 MG tablet [id number] Yes Take 1 tablet (80 mg total) by mouth every evening. [name], MD Taking Active Self, Pharmacy Records  azelastine (ASTELIN) 0.1 % nasal spray [id number] Yes Place 2 sprays into both nostrils daily. Use in each nostril as directed  Patient taking differently: Place 2 sprays into both nostrils at bedtime as needed for allergies or rhinitis. Use in each nostril as directed   Kozlow, [name], MD Taking Active Pharmacy Records, Self           Med Note [facility] Alinda Dooms A   Fri Mar 29, 2023  9:04 PM)    cephALEXin (KEFLEX) 500 MG capsule [id number]  Yes Take 1 capsule (500 mg total) by mouth 3 (three) times daily for 5 days. Leroy Sea, MD Taking Active   cetirizine (ZYRTEC) 10 MG tablet [id number] Yes Take 1 tablet (10 mg total) by mouth 2 (two) times daily as needed for allergies (Can take a n extra dose during flare ups.).  Patient taking differently: Take 10 mg  by mouth at bedtime.   Kozlow, [name], MD Taking Active Pharmacy Records, Self  Cholecalciferol (VITAMIN D3) 25 MCG (1000 UT) capsule [id number] Yes Take 1,000 Units by mouth at bedtime. [provider] Taking Active Pharmacy Records, Self  clopidogrel (PLAVIX) 75 MG tablet [id number] Yes Take 1 tablet (75 mg total) by mouth daily. Jodelle Red, MD Taking Active Self, Pharmacy Records  cyproheptadine (PERIACTIN) 4 MG tablet [id number] Yes Take 3 tablets (12 mg total) by mouth at bedtime.  Patient taking differently: Take 8 mg by mouth at bedtime.   Kozlow, [name], MD Taking Active Self, Pharmacy Records  denosumab [facility]) 60 MG/ML SOSY injection [id number] Yes Inject 60 mg into the skin every 6 (six) months. [provider] Taking Active Pharmacy Records, Self           Med Note Kandis Cocking [place], [place] A   Fri Mar 29, 2023  9:19 PM) Last dose was about 6 months ago  dexlansoprazole (DEXILANT) 60 MG capsule [id number] Yes Take 1 capsule (60 mg total) by mouth in the morning. Kozlow, [name], MD Taking Active Self, Pharmacy Records  diclofenac Sodium (VOLTAREN) 1 % GEL [id number] Yes Apply 1 Application topically as needed (pain). [provider] Taking Active Self, Pharmacy Records           Med Note [facility] [place], [place] A   Fri Mar 29, 2023  9:04 PM)    empagliflozin (JARDIANCE) 10 MG TABS tablet [id number] Yes Take 1 tablet (10 mg total) by mouth daily. [name], MD Taking Active Self, Pharmacy Records  EPINEPHRINE 0.3 mg/0.3 mL IJ SOAJ injection [id number] Yes USE AS DIRECTED BY YOUR PHYSICIAN INTRAMUSCULARLY AS NEEDED FOR ANAPHYLAXIS Kozlow, [name], MD Taking Active Self, Pharmacy Records  famotidine (PEPCID) 20 MG tablet [id number] Yes Take 1 tablet (20 mg total) by mouth every morning. TAKE 1 TABLET BY MOUTH EACH NIGHT AT BEDTIME Strength: 20 mg Kozlow, [name], MD Taking Active Self, Pharmacy Records  Fluticasone-Umeclidin-Vilant [facility] ELLIPTA)  200-62.5-25 MCG/ACT AEPB [id number] Yes Inhale 1 Inhalation into the lungs in the morning.  Patient taking differently: Inhale 1 Inhalation into the lungs at bedtime.   Kozlow, [name], MD Taking Active Self, Pharmacy Records  folic acid (FOLVITE) 1 MG tablet [id number] Yes Take 1 tablet (1 mg total) by mouth daily. [name], MD Taking Active Self, Pharmacy Records  furosemide (LASIX) 40 MG tablet [id number] Yes Take 40 mg by mouth 2 (two) times a week. Monday and Thursday [provider] Taking Active Self, Pharmacy Records  gabapentin (NEURONTIN) 300 MG capsule [id number] Yes Take 1 capsule (300 mg total) by mouth at bedtime. Lewie Chamber, MD Taking Active Pharmacy Records, Self           Med Note [facility] [place], [place] A   Fri Mar 29, 2023  9:08 PM)    guaiFENesin (MUCINEX) 600 MG 12 hr tablet [id number] Yes Take 600 mg by mouth 2 (two) times daily. [provider] Taking Active Pharmacy Records, Self  HIZENTRA 10 GM/50ML SOLN [id number] Yes Inject 15 g into the skin once a week. 13 g  Infusion Kozlow, [name], MD Taking Active Self, Pharmacy Records  Immune Germaine Pomfret, Human, [facility]) 1 GM/5ML SOLN [id number] Yes Inject 1 g into the skin. [provider] Taking Active Self, Pharmacy Records  ipratropium-albuterol (DUONEB) 0.5-2.5 (3) MG/3ML nebulizer solution 3 mL [id number]   Martina Sinner, MD  Active   ipratropium-albuterol (DUONEB) 0.5-2.5 (3) MG/3ML SOLN [id number] Yes Take 3 mLs by nebulization every 6 (six) hours as needed. Martina Sinner, MD Taking Active Pharmacy Records, Self  isosorbide-hydrALAZINE (BIDIL) 20-37.5 MG tablet [id number] Yes Take 1 tablet by mouth 2 (two) times daily. Jodelle Red, MD Taking Active Self, Pharmacy Records  Lactase (LACTOSE FAST ACTING RELIEF PO) [id number] Yes Take 2-3 tablets by mouth 3 (three) times daily before meals. For dairy food or drink [provider] Taking Active Pharmacy Records, Self   magnesium oxide (MAG-OX) 400 (240 Mg) MG tablet [id number] Yes Take 400 mg by mouth at bedtime. [provider] Taking Active Pharmacy Records, Self  metroNIDAZOLE (FLAGYL) 500 MG tablet [id number] Yes Take 1 tablet (500 mg total) by mouth 3 (three) times daily for 7 days. Leroy Sea, MD Taking Active   montelukast (SINGULAIR) 10 MG tablet [id number] Yes Take 1 tablet (10 mg total) by mouth daily after supper. [name], MD Taking Active Self, Pharmacy Records  mupirocin ointment [facility]) 2 % [id number] Yes Apply 1 Application topically as needed (wounds). Kozlow, [name], MD Taking Active Self, Pharmacy Records  nystatin (MYCOSTATIN) 100000 UNIT/ML suspension [id number] Yes 5 mls swish, gargle, swallow after Trelegy use Kozlow, [name], MD Taking Active Self, Pharmacy Records  potassium chloride (KLOR-CON) 10 MEQ tablet [id number] Yes Take 10 mEq by mouth daily. [provider] Taking Active Self, Pharmacy Records  predniSONE (DELTASONE) 1 MG tablet [id number] Yes TAKE 2 TABLETS (2MG ) BY MOUTH ONCE DAILY WITH BREAKFAST. TAKE ALONG WITH 5MG  DAILY Rice, [name], MD Taking Active Self, Pharmacy Records  predniSONE (DELTASONE) 5 MG tablet [id number] Yes TAKE 1 TABLET (5MG ) BY MOUTH ONCE DAILY WITH BREAKFAST Rice, [name], MD Taking Active Self, Pharmacy Records  Prenatal Vit-Fe Fumarate-FA (PRENATAL VITAMINS PO) [id number] Yes Take 1 tablet by mouth in the morning. [provider] Taking Active Pharmacy Records, Self  Probiotic Product (ALIGN) 4 MG CAPS [id number] Yes Take 4 mg by mouth every evening. [provider] Taking Active Pharmacy Records, Self  Respiratory Therapy Supplies (NEBULIZER MASK ADULT) MISC [id number] Yes 1 Device by Does not apply route as directed. Kozlow, [name], MD Taking Active Self, Pharmacy Records  rivaroxaban (XARELTO) 20 MG TABS tablet [id number] Yes Take 1 tablet (20 mg total) by mouth daily with supper. [name],  MD Taking Active Self, Pharmacy Records  sacubitril-valsartan [facility]) 24-26 [place] [id number] Yes Take 1 tablet by mouth 2 (two) times daily. Jodelle Red, MD Taking Active Self, Pharmacy Records  traMADol Janean Sark) 50 MG tablet [id number] Yes Take 1 tablet (50 mg total) by mouth at bedtime. [name], MD Taking Active Self, Pharmacy Records  traZODone (DESYREL) 50 MG tablet [id number] Yes Take 2 tablets (100 mg total) by mouth at bedtime.  Patient taking differently: Take 50 mg by mouth at bedtime.   [name], MD Taking Active Self, Pharmacy Records  Med List Note Geri Seminole, CPhT 01/04/21 1640):              Home Care and Equipment/Supplies: Were Home Health Services Ordered?: Yes Name of Home Health Agency:: [facility]  PT Has Agency  set up a time to come to your home?: Yes First Home Health Visit Date: 04/04/23 Any new equipment or medical supplies ordered?: No  Functional Questionnaire: Do you need assistance with meal preparation?: No Do you need assistance with eating?: No Do you have difficulty maintaining continence: No Do you need assistance with getting out of bed/getting out of a chair/moving?: Yes (uses walker) Do you have difficulty managing or taking your medications?: No  Follow up appointments reviewed: PCP Follow-up appointment confirmed?:  (pt states she prefers to call and scheduled her own f/u appt w/ PCP, labs and CXR) MD Provider Line Number:[phone] Given: No Specialist Hospital Follow-up appointment confirmed?: Yes Date of Specialist follow-up appointment?: 04/16/23 Follow-Up Specialty Provider:: [facility] [place] Do you need transportation to your follow-up appointment?: No (pt has friend that provides transportation) Do you understand care options if your condition(s) worsen?: Yes-patient verbalized understanding  SDOH Interventions Today    Flowsheet Row Most Recent Value  SDOH Interventions    Food Insecurity Interventions Intervention Not Indicated  Housing Interventions Intervention Not Indicated  Transportation Interventions Intervention Not Indicated  Utilities Interventions Intervention Not Indicated       Irving Shows [facility], BSN RN Care Manager/ Transition of Care [place]/ [facility] Population Health [phone]

## 2023-04-04 NOTE — Telephone Encounter (Signed)
Copied from CRM [id number]. Topic: General - Other >> Apr 04, 2023 11:16 AM Elle L wrote: Reason for CRM: [facility] Manager with [facility] was following up regarding her request for DME equipment, a rollator and a grounding mat, for the patient. Her call back number is [phone] ext [phone].

## 2023-04-08 NOTE — Telephone Encounter (Signed)
Patient called stating she is needing a new order to begin her Hizentra injection. Patient states she has been in the hospital and needs a new order. Patient states the order needs to be sent to [facility] at this fax number [phone].

## 2023-04-09 ENCOUNTER — Encounter: Payer: Self-pay | Admitting: Family Medicine

## 2023-04-09 DIAGNOSIS — J4550 Severe persistent asthma, uncomplicated: Secondary | ICD-10-CM | POA: Diagnosis not present

## 2023-04-09 DIAGNOSIS — I5022 Chronic systolic (congestive) heart failure: Secondary | ICD-10-CM | POA: Diagnosis not present

## 2023-04-09 DIAGNOSIS — J189 Pneumonia, unspecified organism: Secondary | ICD-10-CM | POA: Diagnosis not present

## 2023-04-09 DIAGNOSIS — E1122 Type 2 diabetes mellitus with diabetic chronic kidney disease: Secondary | ICD-10-CM | POA: Diagnosis not present

## 2023-04-09 DIAGNOSIS — J470 Bronchiectasis with acute lower respiratory infection: Secondary | ICD-10-CM | POA: Diagnosis not present

## 2023-04-09 DIAGNOSIS — R6520 Severe sepsis without septic shock: Secondary | ICD-10-CM | POA: Diagnosis not present

## 2023-04-09 DIAGNOSIS — J9611 Chronic respiratory failure with hypoxia: Secondary | ICD-10-CM | POA: Diagnosis not present

## 2023-04-09 DIAGNOSIS — A419 Sepsis, unspecified organism: Secondary | ICD-10-CM | POA: Diagnosis not present

## 2023-04-09 DIAGNOSIS — I130 Hypertensive heart and chronic kidney disease with heart failure and stage 1 through stage 4 chronic kidney disease, or unspecified chronic kidney disease: Secondary | ICD-10-CM | POA: Diagnosis not present

## 2023-04-09 IMAGING — DX DG CHEST 1V PORT
1 series · 1 of 1 positions shown · non-contrast
Comparison: Chest CT 08/23/20

CLINICAL DATA: Questionable sepsis.  Evaluate for abnormality.

EXAM:
PORTABLE CHEST 1 VIEW

[chest ap]
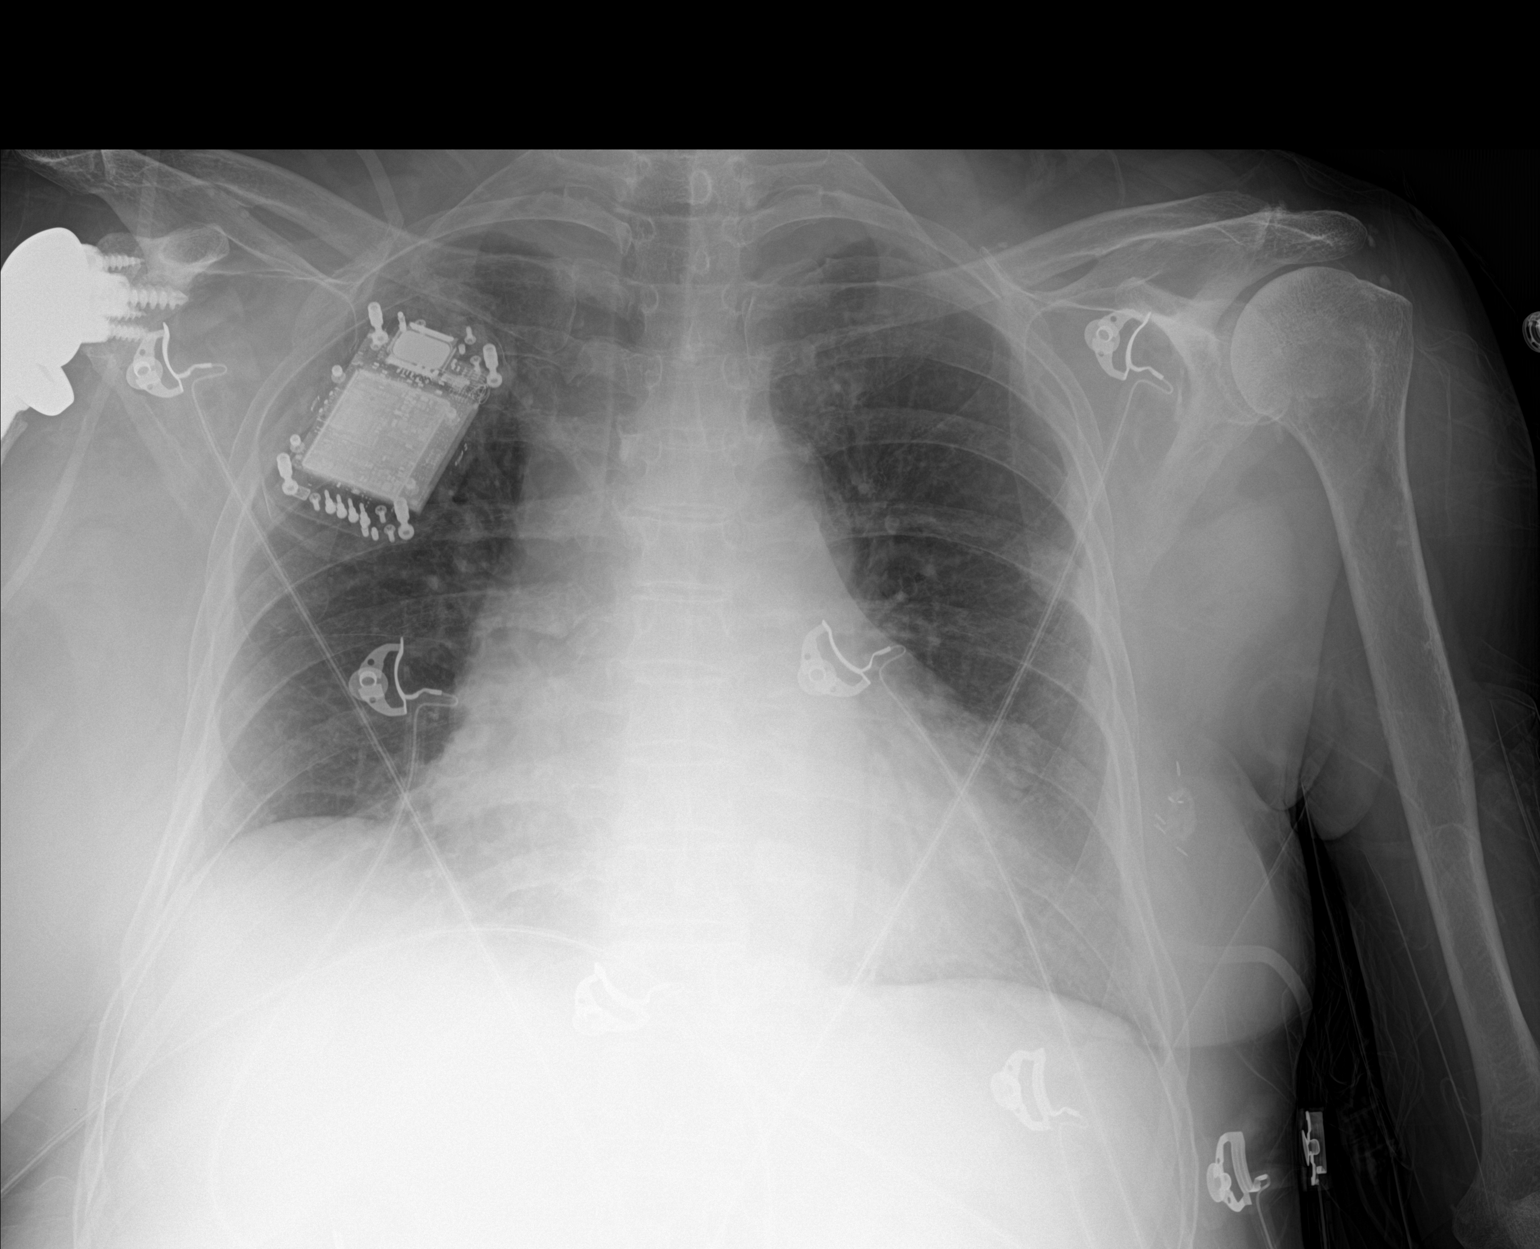

[1 of 1 positions shown; findings below may reference images not displayed]

FINDINGS: Electronic device is positioned over the right upper lobe which
precludes evaluation of this area. Stable cardiac enlargement. No
pleural effusion or edema. No airspace opacities identified. Remote
left posterior rib fractures. Previous right shoulder arthroplasty.
IMPRESSION: No acute cardiopulmonary abnormalities.

## 2023-04-09 IMAGING — DX DG HAND COMPLETE 3+V*R*
3 series · 3 of 3 positions shown · non-contrast
Comparison: Radiograph 05/25/2020

CLINICAL DATA: Questionable sepsis - evaluate for abnormality

EXAM:
RIGHT HAND - COMPLETE 3+ VIEW

[hand ap]
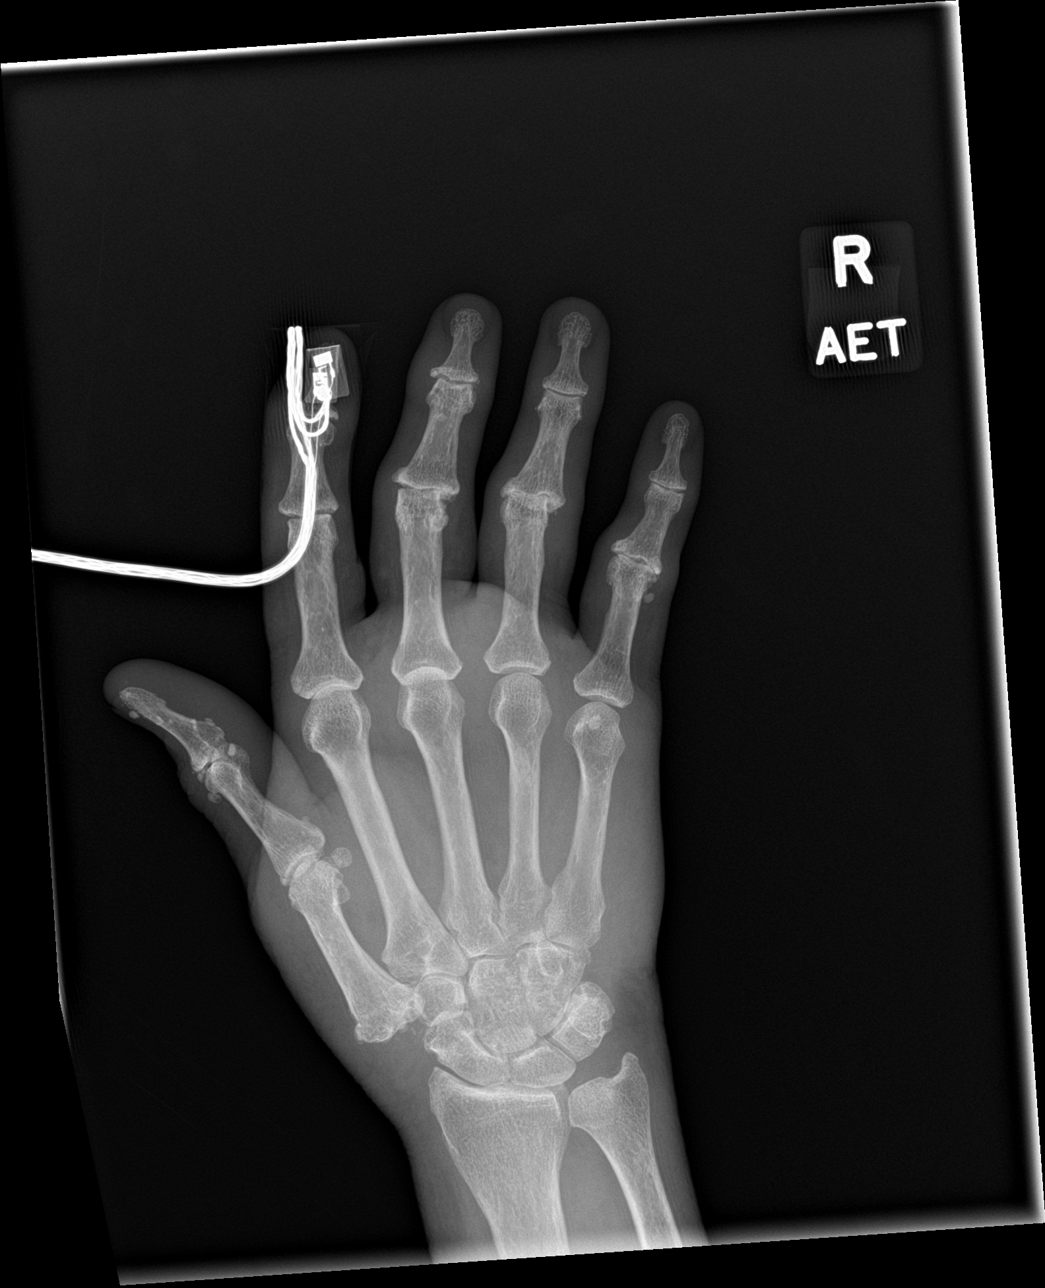

[hand obl]
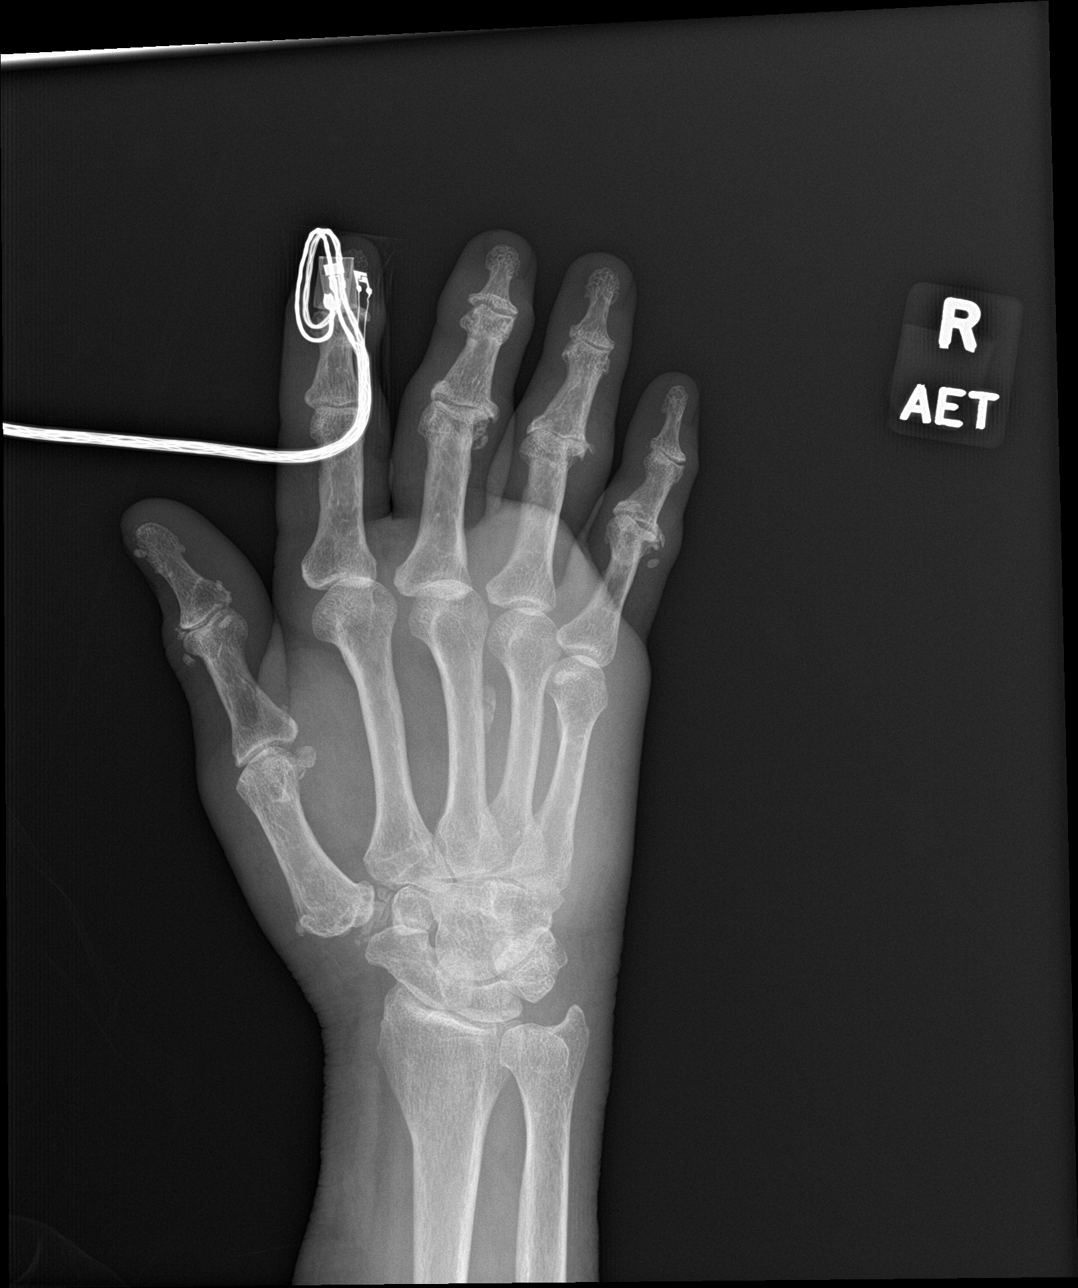

[hand lat]
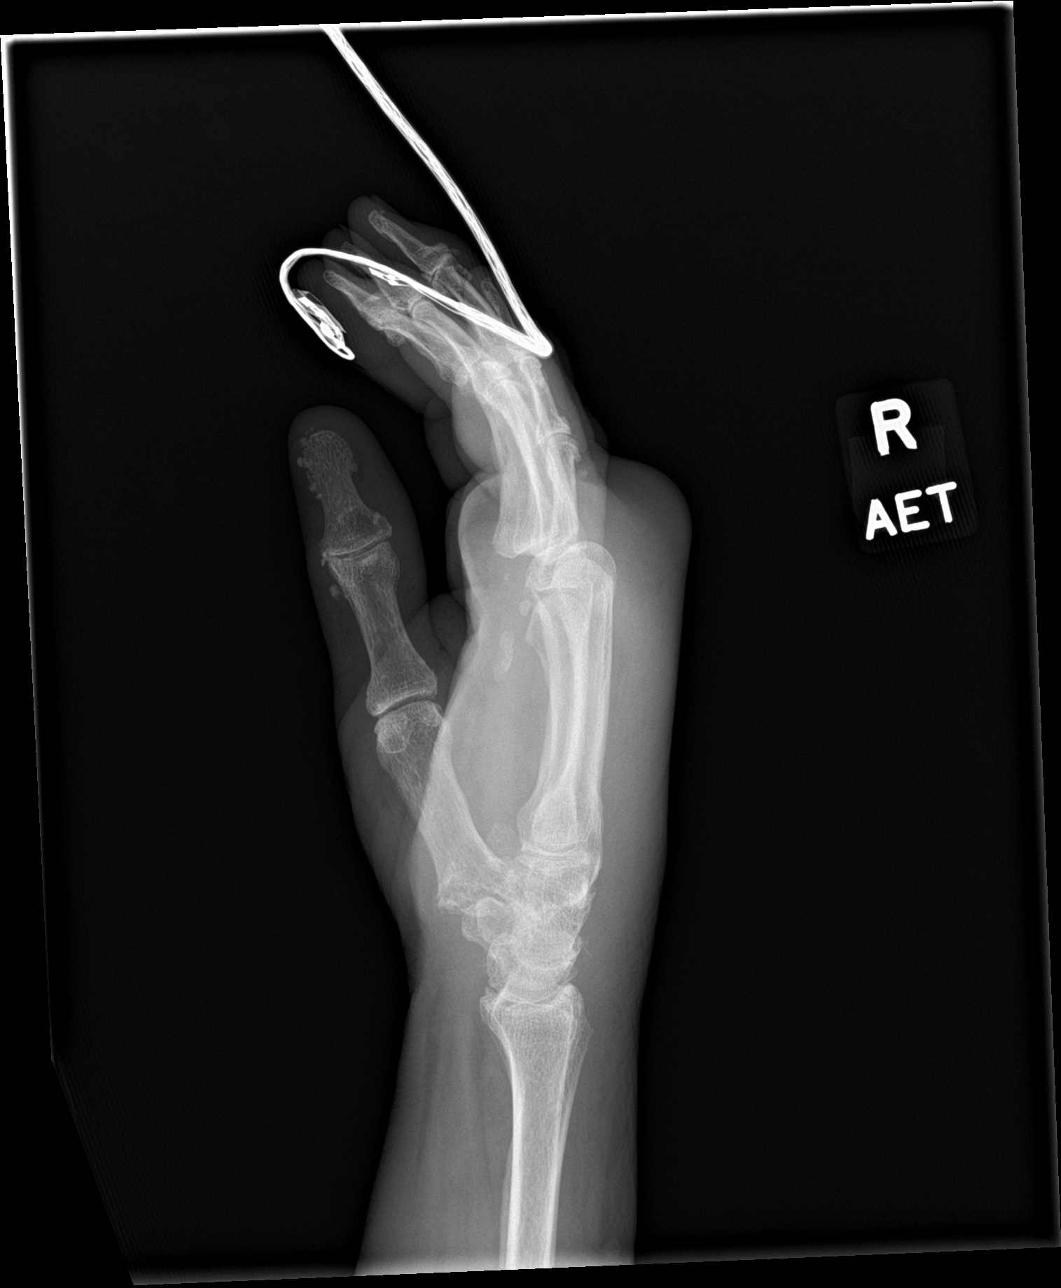

[3 of 3 positions shown; findings below may reference images not displayed]

FINDINGS: There is no acute fracture or dislocation. Prior resection of the
trapezium. Mild radiocarpal and intercarpal joint degenerative
change. There is volar subluxation of the middle finger MCP joint
and possibly the index finger MCP joint. There is moderate-severe
interphalangeal joint degenerative change, worst at the proximal
phalangeal joints of the third through fifth digits. There is
prominent dorsal soft tissue swelling. There are soft tissue
calcifications along the thumb and heterotopic ossification along
the volar soft tissues adjacent to the fourth metacarpal, unchanged
from prior exam.

There is no new osseous erosion or destruction.
IMPRESSION: Prominent dorsal soft tissue swelling.

Scattered osteoarthritis with prior trapeziectomy. Volar subluxation
of the third MCP joint and possibly the index finger MCP joint which
may be chronic.

No radiographic evidence of osteomyelitis.

## 2023-04-09 NOTE — Telephone Encounter (Signed)
Called [facility] - spoke to [place] - DOB verified - advised of patient's notation below - advised faxed physician orders will need to be faxed to our office if new orders are needed.  Victorino Dike verbalized understanding to all - will send message to Hollice Gong for status.

## 2023-04-10 NOTE — Addendum Note (Signed)
Addended by: Geralyn Flash D on: 04/10/2023 11:55 AM   Modules accepted: Orders

## 2023-04-10 NOTE — Progress Notes (Signed)
 Remote ICD transmission.

## 2023-04-11 ENCOUNTER — Inpatient Hospital Stay: Payer: Medicare PPO | Admitting: Family Medicine

## 2023-04-15 ENCOUNTER — Telehealth: Payer: Self-pay | Admitting: Allergy and Immunology

## 2023-04-15 ENCOUNTER — Other Ambulatory Visit: Payer: Self-pay | Admitting: *Deleted

## 2023-04-15 IMAGING — DX DG FOOT COMPLETE 3+V*R*
3 series · 3 of 3 positions shown · non-contrast
Comparison: Foot radiograph 07/20/2020

CLINICAL DATA: RIGHT foot pain.

EXAM:
RIGHT FOOT COMPLETE - 3+ VIEW

[foot ap]
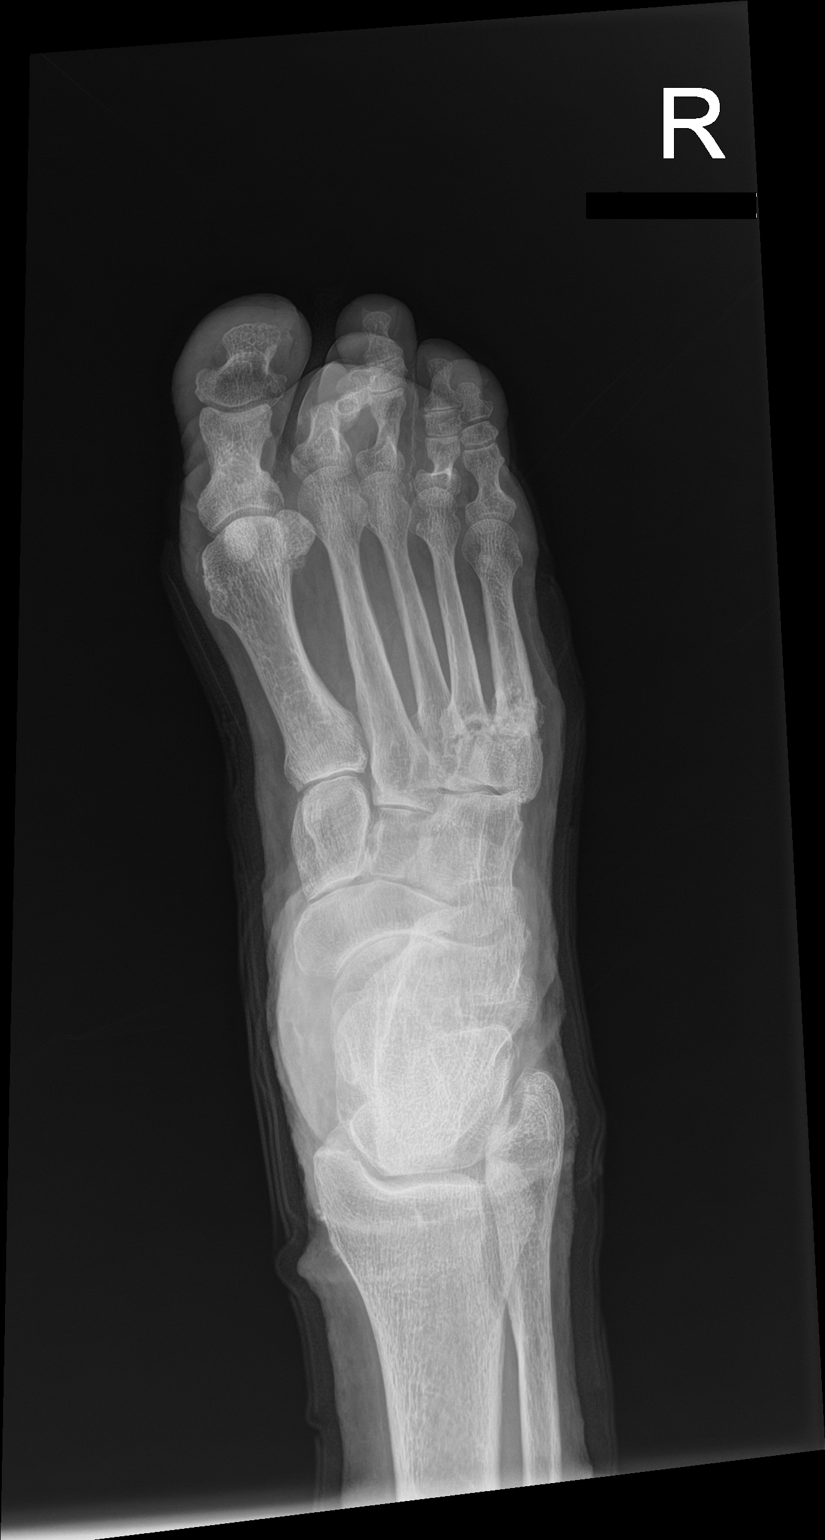

[foot obl]
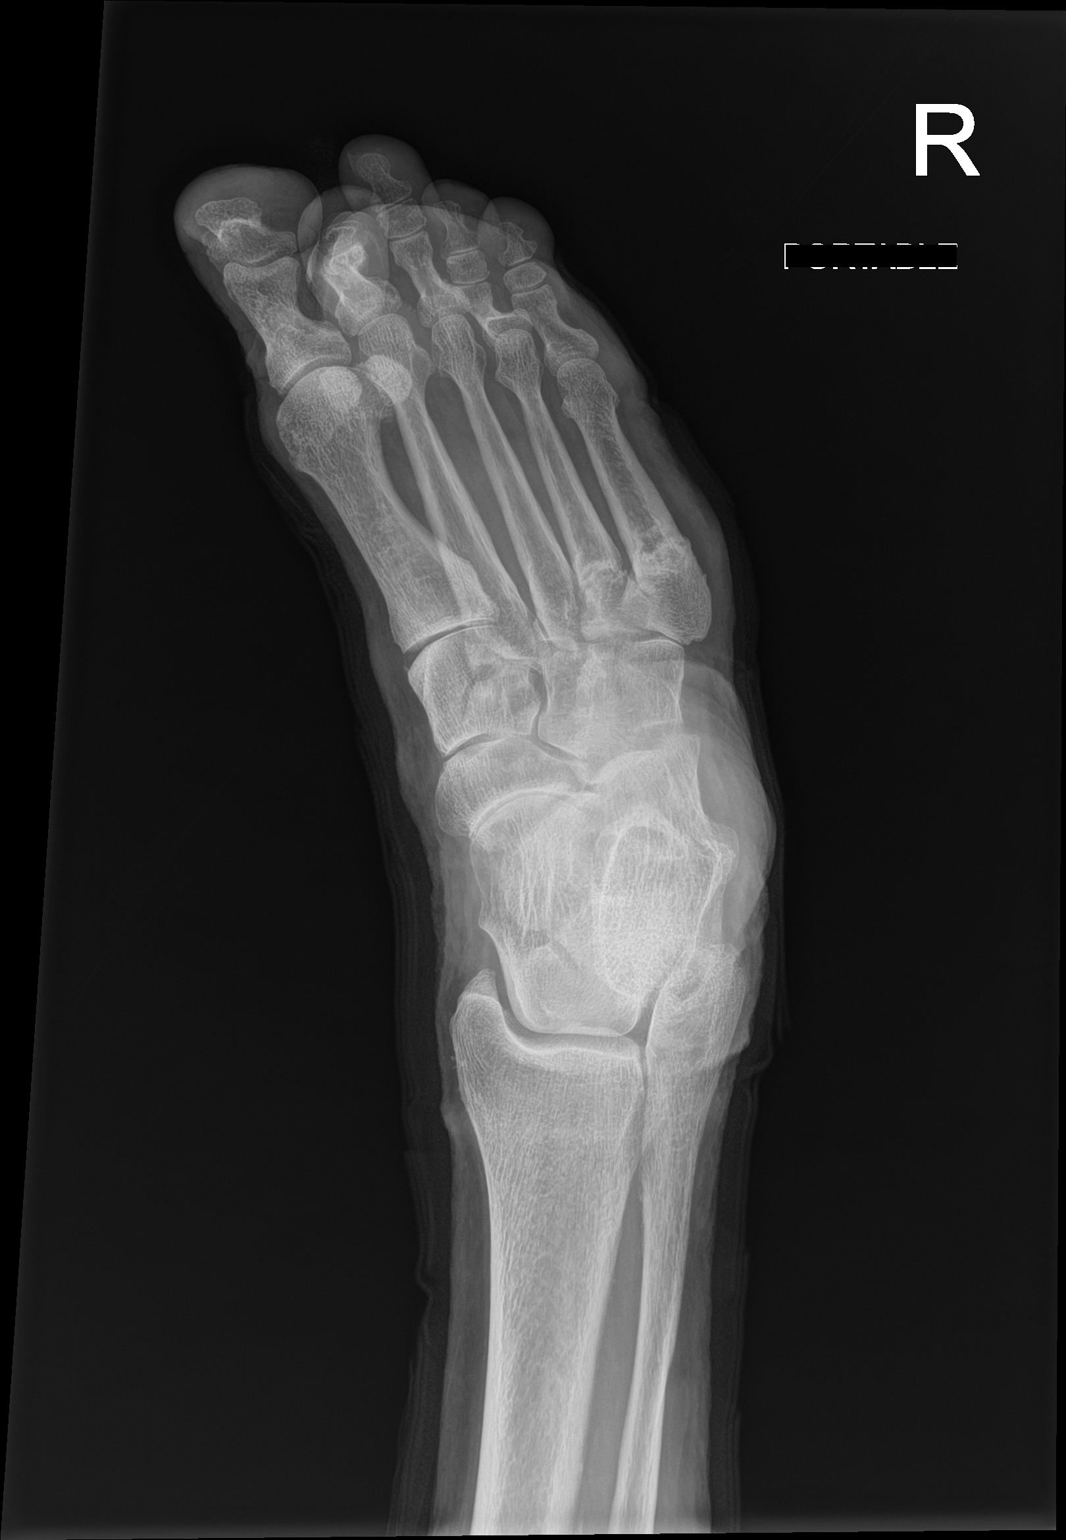

[foot lat]
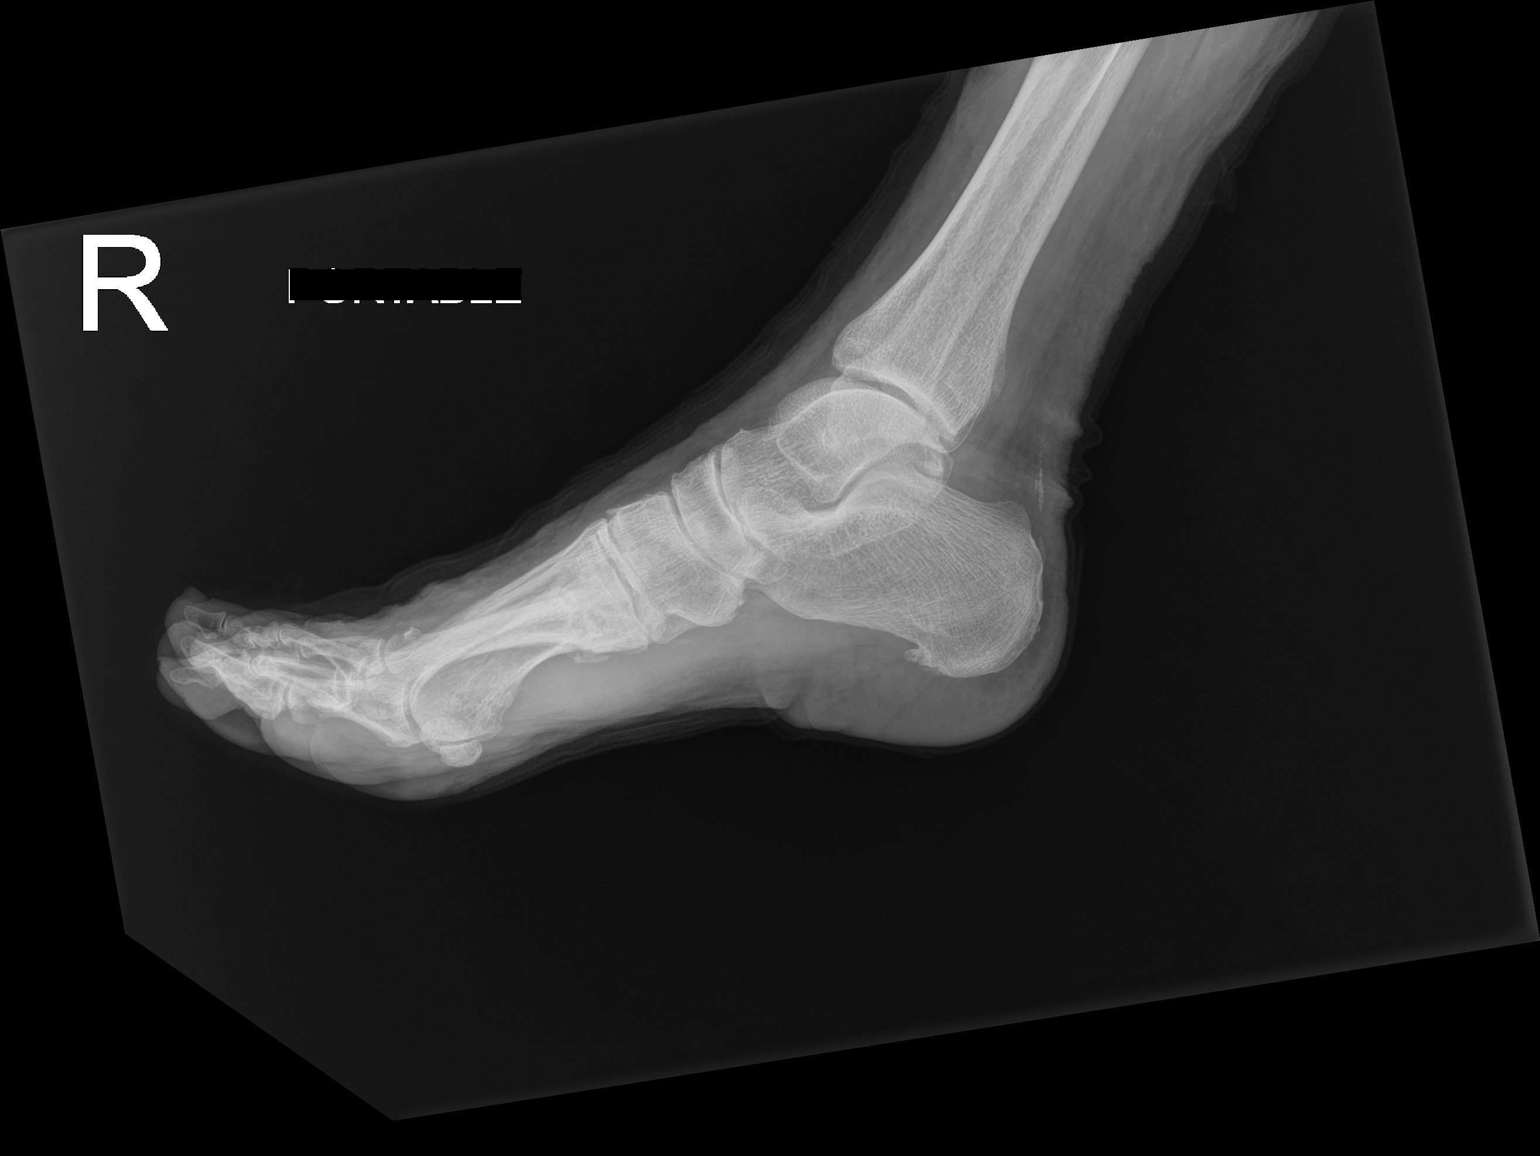

[3 of 3 positions shown; findings below may reference images not displayed]

FINDINGS: Chronic fractures at the base of the fourth and fifth metatarsal. No
acute fracture dislocation. No soft tissue abnormality.
IMPRESSION: 1. No acute findings of the RIGHT foot.
2. Chronic fractures of the base of the fourth and fifth metatarsal.

## 2023-04-15 NOTE — Telephone Encounter (Signed)
 Pt requesting a call back, she was just discharged from the hospital and needed some information about her infusions.

## 2023-04-15 NOTE — Telephone Encounter (Signed)
 Last Fill: 01/25/2023  Next Visit: 05/13/2023  Last Visit: 12/11/2022  Dx: Rheumatoid arthritis with negative rheumatoid factor, involving unspecified site   Current Dose per office note on 12/11/2022: prednisone 7 mg daily   Okay to refill Prednisone?

## 2023-04-15 NOTE — Telephone Encounter (Signed)
 Patient contacted the office to request a medication refill.   1. Name of Medication: Prednisone  2. How are you currently taking this medication (dosage and times per day)? 7 mg po daily   3. What pharmacy would you like for that to be sent to? [facility] [address]

## 2023-04-16 ENCOUNTER — Ambulatory Visit: Payer: Medicare PPO | Admitting: Allergy and Immunology

## 2023-04-16 ENCOUNTER — Other Ambulatory Visit: Payer: Self-pay | Admitting: Allergy and Immunology

## 2023-04-16 ENCOUNTER — Inpatient Hospital Stay: Payer: Medicare PPO | Admitting: Family Medicine

## 2023-04-16 ENCOUNTER — Telehealth: Payer: Self-pay

## 2023-04-16 VITALS — BP 122/66 | HR 80 | Temp 98.3°F | Resp 98 | Ht 60.0 in | Wt 128.6 lb

## 2023-04-16 DIAGNOSIS — D801 Nonfamilial hypogammaglobulinemia: Secondary | ICD-10-CM | POA: Diagnosis not present

## 2023-04-16 DIAGNOSIS — G478 Other sleep disorders: Secondary | ICD-10-CM

## 2023-04-16 DIAGNOSIS — K219 Gastro-esophageal reflux disease without esophagitis: Secondary | ICD-10-CM | POA: Diagnosis not present

## 2023-04-16 DIAGNOSIS — B370 Candidal stomatitis: Secondary | ICD-10-CM | POA: Diagnosis not present

## 2023-04-16 DIAGNOSIS — J479 Bronchiectasis, uncomplicated: Secondary | ICD-10-CM | POA: Diagnosis not present

## 2023-04-16 DIAGNOSIS — J4550 Severe persistent asthma, uncomplicated: Secondary | ICD-10-CM | POA: Diagnosis not present

## 2023-04-16 MED ORDER — TRELEGY ELLIPTA 200-62.5-25 MCG/ACT IN AEPB: 1.0000 | INHALATION_SPRAY | Freq: Every morning | RESPIRATORY_TRACT | 1 refills | Status: DC

## 2023-04-16 MED ORDER — FAMOTIDINE 20 MG PO TABS: 20.0000 mg | ORAL_TABLET | Freq: Every day | ORAL | 1 refills | Status: DC

## 2023-04-16 MED ORDER — PREDNISONE 5 MG PO TABS: 5.0000 mg | ORAL_TABLET | Freq: Every day | ORAL | 0 refills | Status: DC

## 2023-04-16 MED ORDER — NYSTATIN 100000 UNIT/ML MT SUSP: OROMUCOSAL | 3 refills | Status: DC

## 2023-04-16 MED ORDER — CETIRIZINE HCL 10 MG PO TABS: 10.0000 mg | ORAL_TABLET | Freq: Every day | ORAL | 1 refills | Status: DC | PRN

## 2023-04-16 MED ORDER — AZELASTINE HCL 0.1 % NA SOLN: 2.0000 | Freq: Every day | NASAL | 1 refills | Status: DC | PRN

## 2023-04-16 MED ORDER — PREDNISONE 1 MG PO TABS: 1.0000 mg | ORAL_TABLET | Freq: Every day | ORAL | 0 refills | Status: DC

## 2023-04-16 MED ORDER — DEXLANSOPRAZOLE 60 MG PO CPDR: 60.0000 mg | DELAYED_RELEASE_CAPSULE | Freq: Every morning | ORAL | 1 refills | Status: DC

## 2023-04-16 NOTE — Telephone Encounter (Signed)
 Copied from CRM [id number]. Topic: Appointments - Appointment Scheduling >> Apr 04, 2023  4:24 PM Fuller Mandril wrote: Patient/patient representative is calling to schedule an appointment. Refer to attachments for appointment information.

## 2023-04-16 NOTE — Patient Instructions (Addendum)
  1. Continue to Treat  sleep dysfunction with Primary doctor  2. Continue to use prednisone with Rheumatologist  3. Continue to Treat laryngopharyngeal reflux:    A. Dexilant 60 mg in the morning  B. famotidine 40 mg daily  4.  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  Continue immunoglobulin infusions   B.  Check IgA/G/M level after 4th infusion  5. Continue to Treat inflammation of airway:   A. Trelegy 200 - I inhalation 1 time per day  6. Continue to Treat and prevent thrush:   A. Nystatin - 5 mls swish, gargle, swallow after Trelegy use  7.  If needed:  A. Astelin and Zyrtec and nasal saline  B. Percussion vest   C. Albuterol + Fluticasone 44 - 2 inhalations TOGETHER every 4-6 hours D. Albuterol nebulization every 4-6 hours  8. Use oxygen in CPAP or with [place] at 2L if sleeping on couch   9. Return to clinic in 8 weeks or earlier if problem  10. Influenza = Tamiflu. Covid = Paxlovid

## 2023-04-16 NOTE — Progress Notes (Unsigned)
 [employer] - [place] - [place] - [place] - [place]   Follow-up Note  Referring Provider: [name], MD Primary Provider: [name], MD Date of Office Visit: 04/16/2023  Subjective:   Ebony Scott (DOB: Jan 13, 1951) is a 73 y.o. 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  I last saw her in this clinic 29 January 2023.  She had a febrile illness requiring hospitalization with a diagnosis of pneumonia treated successfully with antibiotics from 29 March 2023 through 01 April 2023.  Currently her big complaints are she cannot sleep.  She is having a difficult time using her CPAP machine.  She usually ends up on the couch in a reclining position without any oxygen.  It sounds as though 50% of her time is spent on the couch and 50% of time is spent in the bed with the CPAP with oxygen.  She unfortunately missed 2 of her weekly immunoglobulin infusions during her hospitalization.  She is not really having any significant respiratory tract symptoms at this point in time.  During her last visit her mouth was getting irritated while using triple inhaler but now that she is consistently using nystatin after the triple inhaler she has not had any problems with her mouth.  She is not having any problems with reflux at this point in time.  She does not consume shellfish.  She remains on daily prednisone therapy at 7 mg/day which is her primary treatment for rheumatoid arthritis.  Allergies as of 04/16/2023       Reactions   Baclofen Anaphylaxis, Other (See Comments)   Altered mental status requiring a 3-day hospital stay- patient became unresponsive "Put me in a coma for five days",  Altered mental status requiring a 3-day hospital stay- patient became unresponsive   Other Shortness Of Breath, Other (See Comments)   Grass and weeds "sneezing; filled sinuses" (02/20/2012)   Pork-derived Products Anaphylaxis   Shellfish Allergy Anaphylaxis, Shortness Of Breath, Itching, Rash   Shrimp Extract Anaphylaxis, Other (See Comments)   ALL SHELLFISH   Tetracycline Hcl Nausea And Vomiting   Xolair [omalizumab] Other (See Comments)   Caused Blood clot   Zoledronic Acid Other (See Comments)   Reclast- Fever, Put in hospital, dr said it was a reaction from a reaction  Fever, inability to walk, Put in hospital Fever, Put in hospital, dr said it was a reaction from a reaction , Other reaction(s): Unknown   Celecoxib Swelling   Feet swelling   Dilaudid [hydromorphone Hcl] Itching   Hydrocodone-acetaminophen Itching   Levofloxacin Other (See Comments)   GI upset   Molds & Smuts Cough   Morphine And Codeine Hives, Itching   Oxycodone Hcl Nausea And Vomiting   GI Intolerance   Paroxetine Nausea And Vomiting, Other (See Comments)   GI Intolerance also   Penicillins Rash, Other (See Comments)   "welts" (02/20/2012) Tolerated Ancef 02/16/20   Tetracycline Hcl Other (See Comments)   GI Intolerance   Oxycodone-acetaminophen Itching   Diltiazem Hcl Swelling   Dust Mite Extract Other (See Comments), Cough   "sneezing" (02/20/2012) too   Gamunex [immune Globulin] Itching, Rash   Tolerates hizentra    Penicillin G Procaine Rash, Other (See Comments)   Welts, also   Rituximab Other (See Comments)   Anemia  Rofecoxib Swelling   Vioxx- feet swelling   Tree Extract Itching   "tested and told I was allergic to it; never experienced a reaction to it" (02/20/2012)        Medication List    acetaminophen 500 MG tablet Commonly known as: TYLENOL Take 1,000 mg by mouth as needed for moderate pain.   albuterol 108 (90 Base) MCG/ACT inhaler Commonly known as: VENTOLIN HFA Inhale 2  puffs into the lungs every 6 (six) hours as needed for wheezing or shortness of breath.   albuterol (2.5 MG/3ML) 0.083% nebulizer solution Commonly known as: PROVENTIL Take 3 mLs (2.5 mg total) by nebulization every 4 (four) hours as needed for wheezing or shortness of breath.   Align 4 MG Caps Take 4 mg by mouth every evening.   allopurinol 100 MG tablet Commonly known as: ZYLOPRIM Take 1 tablet (100 mg total) by mouth daily.   Alpha-Lipoic Acid 600 MG Tabs Take 600 mg by mouth in the morning.   atorvastatin 80 MG tablet Commonly known as: LIPITOR Take 1 tablet (80 mg total) by mouth every evening.   azelastine 0.1 % nasal spray Commonly known as: ASTELIN Place 2 sprays into both nostrils daily as needed for rhinitis. Use in each nostril as directed   BEANO PO Take 2-3 tablets by mouth 3 (three) times daily as needed (Gas).   cetirizine 10 MG tablet Commonly known as: ZYRTEC Take 1 tablet (10 mg total) by mouth daily as needed for allergies (Can take a n extra dose during flare ups.).   clopidogrel 75 MG tablet Commonly known as: PLAVIX Take 1 tablet (75 mg total) by mouth daily.   denosumab 60 MG/ML Sosy injection Commonly known as: PROLIA Inject 60 mg into the skin every 6 (six) months.   dexlansoprazole 60 MG capsule Commonly known as: Dexilant Take 1 capsule (60 mg total) by mouth in the morning.   empagliflozin 10 MG Tabs tablet Commonly known as: Jardiance Take 1 tablet (10 mg total) by mouth daily.   EPINEPHrine 0.3 mg/0.3 mL Soaj injection Commonly known as: EPI-PEN USE AS DIRECTED BY YOUR PHYSICIAN INTRAMUSCULARLY AS NEEDED FOR ANAPHYLAXIS   famotidine 20 MG tablet Commonly known as: PEPCID Take 1 tablet (20 mg total) by mouth daily before lunch. TAKE 1 TABLET BY MOUTH EACH NIGHT AT BEDTIME Strength: 20 mg What changed: when to take this Changed by: Aliciana Ricciardi J Dominiq Fontaine   folic acid 1 MG tablet Commonly known as: FOLVITE Take 1 tablet (1 mg total) by  mouth daily.   furosemide 40 MG tablet Commonly known as: LASIX Take 40 mg by mouth 2 (two) times a week. Monday and Thursday   gabapentin 300 MG capsule Commonly known as: NEURONTIN Take 1 capsule (300 mg total) by mouth at bedtime.   guaiFENesin 600 MG 12 hr tablet Commonly known as: MUCINEX Take 600 mg by mouth 2 (two) times daily.   Hizentra 1 GM/5ML Soln Generic drug: Immune Globulin (Human) Inject 1 g into the skin.   Hizentra 10 GM/50ML Soln Generic drug: Immune Globulin (Human) Inject 15 g into the skin once a week. 13 g Infusion   ipratropium-albuterol 0.5-2.5 (3) MG/3ML Soln Commonly known as: DUONEB Take 3 mLs by nebulization every 6 (six) hours as needed.   isosorbide-hydrALAZINE 20-37.5 MG tablet Commonly known as: BIDIL Take 1 tablet by mouth 2 (two) times daily.   LACTOSE FAST ACTING RELIEF PO Take 2-3 tablets by mouth 3 (three) times daily before meals. For dairy  food or drink   magnesium oxide 400 (240 Mg) MG tablet Commonly known as: MAG-OX Take 400 mg by mouth at bedtime.   montelukast 10 MG tablet Commonly known as: SINGULAIR Take 1 tablet (10 mg total) by mouth daily after supper.   mupirocin ointment 2 % Commonly known as: BACTROBAN Apply 1 Application topically as needed (wounds).   Nebulizer Mask Adult Misc 1 Device by Does not apply route as directed.   nystatin 100000 UNIT/ML suspension Commonly known as: MYCOSTATIN 5 mls swish, gargle, swallow after Trelegy use   potassium chloride 10 MEQ tablet Commonly known as: KLOR-CON Take 10 mEq by mouth daily.   predniSONE 1 MG tablet Commonly known as: DELTASONE Take 1 tablet (1 mg total) by mouth daily with breakfast. Take along with 5 mg daily.   predniSONE 5 MG tablet Commonly known as: DELTASONE Take 1 tablet (5 mg total) by mouth daily with breakfast.   PRENATAL VITAMINS PO Take 1 tablet by mouth in the morning.   rivaroxaban 20 MG Tabs tablet Commonly known as: Xarelto Take  1 tablet (20 mg total) by mouth daily with supper.   sacubitril-valsartan 24-26 MG Commonly known as: ENTRESTO Take 1 tablet by mouth 2 (two) times daily.   traMADol 50 MG tablet Commonly known as: ULTRAM Take 1 tablet (50 mg total) by mouth at bedtime.   traZODone 50 MG tablet Commonly known as: DESYREL Take 2 tablets (100 mg total) by mouth at bedtime.   Trelegy Ellipta 200-62.5-25 MCG/ACT Aepb Generic drug: Fluticasone-Umeclidin-Vilant INHALE 1 INHALATION INTO THE LUNGS IN THE MORNING.   Vitamin D3 25 MCG (1000 UT) Caps Take 1,000 Units by mouth at bedtime.   Voltaren 1 % Gel Generic drug: diclofenac Sodium Apply 1 Application topically as needed (pain).    Past Medical History:  Diagnosis Date   Anemia    Angio-edema    Breast cancer (HCC) 1998   Left breast, in remission   Bronchiectasis (HCC)    CHF (congestive heart failure) (HCC)    Clostridium difficile colitis 01/14/2018   Coronary artery disease    Depression    "some; don't take anything for it" (02/20/2012), pt denies dx as of 06/02/21   Diverticulosis    DVT (deep venous thrombosis) (HCC)    Fibromyalgia 11/2011   GAVE (gastric antral vascular ectasia)    GERD (gastroesophageal reflux disease)    Headache(784.0)    "related to allergies; more at different times during the year" (02/20/2012)   Hemorrhoids    Hiatal hernia    back and neck   History of blood transfusion 2023   1 unit, 5 units of Iron   Hx of adenomatous colonic polyps 04/12/2016   Hypercholesteremia    good cholesterol is high   Hypothyroidism    h/o Graves disease   IBS (irritable bowel syndrome)    Moderate persistent asthma    -FeV1 72% 2011, -IgE 102 2011, CT sinus Neg 2011   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 Casimer Lanius   Sleep apnea    uses cpap  nightly   Tracheobronchomalacia     Past Surgical History:  Procedure Laterality Date   ABDOMINAL HYSTERECTOMY N/A    [employer]reesia 10/21/2019   ANTERIOR AND POSTERIOR REPAIR  1990's   APPENDECTOMY  ARGON LASER APPLICATION  02/05/2021   Procedure: ARGON LASER APPLICATION;  Surgeon: Benancio Deeds, MD;  Location: [facility];  Service: Gastroenterology;;   BREAST LUMPECTOMY  1998   left   BREAST SURGERY N/A    Phreesia 10/21/2019   BRONCHIAL WASHINGS  04/05/2020   Procedure: BRONCHIAL WASHINGS;  Surgeon: Martina Sinner, MD;  Location: [facility];  Service: Pulmonary;;   CAPSULOTOMY Right 08/28/2021   Procedure: CAPSULOTOMY;  Surgeon: Candelaria Stagers, DPM;  Location: [facility];  Service: Podiatry;  Laterality: Right;   CARPOMETACARPEL (CMC) FUSION OF THUMB WITH AUTOGRAFT FROM RADIUS  ~ 2009   "both thumbs" (02/20/2012)   CATARACT EXTRACTION W/ INTRAOCULAR LENS  IMPLANT, BILATERAL  2012   CERVICAL DISCECTOMY  10/2001   C5-C6   CERVICAL FUSION  2003   C3-C4   CHOLECYSTECTOMY     COLONOSCOPY     CORONARY STENT INTERVENTION N/A 01/11/2021   Procedure: CORONARY STENT INTERVENTION;  Surgeon: Marykay Lex, MD;  [facility] INVASIVE CV LAB;  Service: Cardiovascular;  Laterality: N/A;   DEBRIDEMENT TENNIS ELBOW  ?1970's   right   ESOPHAGOGASTRODUODENOSCOPY     ESOPHAGOGASTRODUODENOSCOPY (EGD) WITH PROPOFOL N/A 02/05/2021   Procedure: ESOPHAGOGASTRODUODENOSCOPY (EGD) WITH PROPOFOL;  Surgeon: Benancio Deeds, MD;  Location: [facility];  Service: Gastroenterology;  Laterality: N/A;   EYE SURGERY N/A    Phreesia 10/21/2019   HAMMER TOE SURGERY Right 08/28/2021   Procedure: HAMMER TOE REPAIR 2ND TOE RIGHT FOOT;  Surgeon: Candelaria Stagers, DPM;  Location: [facility];  Service: Podiatry;  Laterality: Right;   HYSTERECTOMY     I & D EXTREMITY Right 09/22/2020   Procedure: IRRIGATION AND DEBRIDEMENT RIGHT HAND AND ELBOW;  Surgeon: Teryl Lucy, MD;  Location: [facility];  Service:  Orthopedics;  Laterality: Right;   ICD IMPLANT N/A 08/27/2022   Procedure: ICD IMPLANT;  Surgeon: Maurice Small, MD;  [facility]: [facility];  Service: Cardiovascular;  Laterality: N/A;   KNEE ARTHROPLASTY  ?1990's   "?right; w/cartilage repair" (02/20/2012)   MASS EXCISION Left 08/28/2021   Procedure: EXCISION OF SOFT TISSUE  MASS LEFT FOOT;  Surgeon: Candelaria Stagers, DPM;  Location: [facility];  Service: Podiatry;  Laterality: Left;   NASAL SEPTUM SURGERY  1980's   POSTERIOR CERVICAL FUSION/FORAMINOTOMY  2004   "failed initial fusion; rewired  anterior neck" (02/20/2012)   REVERSE SHOULDER ARTHROPLASTY Right 02/16/2020   Procedure: REVERSE SHOULDER ARTHROPLASTY;  Surgeon: Teryl Lucy, MD;  Location: [facility];  Service: Orthopedics;  Laterality: Right;   RIGHT/LEFT HEART CATH AND CORONARY ANGIOGRAPHY N/A 01/11/2021   Procedure: RIGHT/LEFT HEART CATH AND CORONARY ANGIOGRAPHY;  Surgeon: Marykay Lex, MD;  Location: [facility] INVASIVE CV LAB;  Service: Cardiovascular;  Laterality: N/A;   SPINE SURGERY N/A    Phreesia 10/21/2019   TONSILLECTOMY  ~ 1953   VESICOVAGINAL FISTULA CLOSURE W/ TAH  1988   VIDEO BRONCHOSCOPY Bilateral 08/23/2016   Procedure: VIDEO BRONCHOSCOPY WITH FLUORO;  Surgeon: Roslynn Amble, MD;  Location: [facility];  Service: Cardiopulmonary;  Laterality: Bilateral;   VIDEO BRONCHOSCOPY N/A 04/05/2020   Procedure: VIDEO BRONCHOSCOPY WITHOUT FLUORO;  Surgeon: Martina Sinner, MD;  Location: [facility];  Service: Pulmonary;  Laterality: N/A;    Review of systems negative except as noted in HPI / PMHx or noted below:  ROS   Objective:   Vitals:   04/16/23 1349  BP: 122/66  Pulse: 80  Resp: (!) 98  Temp: 98.3 F (36.8  C)  SpO2: 95%   Height: 5' (152.4 cm)  Weight: 128 lb 9.6 oz (58.3 kg)   Physical Exam Constitutional:      Appearance: She is not diaphoretic.  HENT:     Head: Normocephalic.     Right Ear: Tympanic membrane, ear canal and external ear  normal.     Left Ear: Tympanic membrane, ear canal and external ear normal.     Nose: Nose normal. No mucosal edema or rhinorrhea.     Mouth/Throat:     Pharynx: Uvula midline. No oropharyngeal exudate.  Eyes:     Conjunctiva/sclera: Conjunctivae normal.  Neck:     Thyroid: No thyromegaly.     Trachea: Trachea normal. No tracheal tenderness or tracheal deviation.  Cardiovascular:     Rate and Rhythm: Normal rate and regular rhythm.     Heart sounds: Normal heart sounds, S1 normal and S2 normal. No murmur heard. Pulmonary:     Effort: No respiratory distress.     Breath sounds: Normal breath sounds. No stridor. No wheezing or rales.  Lymphadenopathy:     Head:     Right side of head: No tonsillar adenopathy.     Left side of head: No tonsillar adenopathy.     Cervical: No cervical adenopathy.  Skin:    Findings: No erythema or rash.     Nails: There is no clubbing.  Neurological:     Mental Status: She is alert.     Diagnostics:    Spirometry was performed and demonstrated an FEV1 of *** at *** % of predicted.  The patient had an Asthma Control Test with the following results:  .    Results of a chest x-ray obtained 31 March 2023 identified the following:  7:02 a.m. Patchy airspace disease in the right lower lobe is not significantly changed.   Remaining lungs are clear of focal infiltrates with asymmetric elevation right hemidiaphragm.   Left chest single lead ICD and wire insertion and right shoulder reverse arthroplasty are again shown.   Heart size and vascular pattern are normal. The mediastinum is normally outlined. Osteopenia.  Assessment and Plan:   1. Hypogammaglobulinemia (HCC)   2. Not well controlled severe persistent asthma   3. Bronchiectasis without complication (HCC)   4. LPRD (laryngopharyngeal reflux disease)   5. Thrush   6. Sleep dysfunction with sleep stage disturbance    1. Continue to Treat  sleep dysfunction with Primary doctor  2.  Continue to use prednisone with Rheumatologist  3. Continue to Treat laryngopharyngeal reflux:    A. Dexilant 60 mg in the morning  B. famotidine 40 mg daily  4.  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  Continue immunoglobulin infusions   B.  Check IgA/G/M level after 4th infusion  5. Continue to Treat inflammation of airway:   A. Trelegy 200 - I inhalation 1 time per day  6. Continue to Treat and prevent thrush:   A. Nystatin - 5 mls swish, gargle, swallow after Trelegy use  7.  If needed:  A. Astelin and Zyrtec and nasal saline  B. Percussion vest   C. Albuterol + Fluticasone 44 - 2 inhalations TOGETHER every 4-6 hours D. Albuterol nebulization every 4-6 hours  8. Use oxygen in CPAP or with [place] at 2L if sleeping on couch   9. Return to clinic in 8 weeks or earlier if problem  10. Influenza = Tamiflu. Covid = Paxlovid  Joyell will continue on immunoglobulin infusions and we  will check her IgG level after her fourth infusion make a decision about whether or not we need to increase her dose of Hizentra based upon that result.  She will continue to treat inflammation of her airway and reflux as noted above.  I told her that she needs oxygen when she sleeps.  If she is sleeping in the recliner then she needs 2 L nasal cannula oxygen.  If she is sleeping in the bed then she should put on her CPAP machine and use oxygen with a machine.  I will see her back in this clinic in 8 weeks.  Laurette Schimke, MD Allergy / Immunology [facility]

## 2023-04-17 ENCOUNTER — Telehealth: Payer: Self-pay

## 2023-04-17 ENCOUNTER — Encounter: Payer: Self-pay | Admitting: Allergy and Immunology

## 2023-04-17 DIAGNOSIS — J45909 Unspecified asthma, uncomplicated: Secondary | ICD-10-CM | POA: Diagnosis not present

## 2023-04-17 DIAGNOSIS — J4540 Moderate persistent asthma, uncomplicated: Secondary | ICD-10-CM | POA: Diagnosis not present

## 2023-04-17 DIAGNOSIS — R0602 Shortness of breath: Secondary | ICD-10-CM | POA: Diagnosis not present

## 2023-04-17 DIAGNOSIS — G894 Chronic pain syndrome: Secondary | ICD-10-CM | POA: Diagnosis not present

## 2023-04-17 DIAGNOSIS — M0600 Rheumatoid arthritis without rheumatoid factor, unspecified site: Secondary | ICD-10-CM | POA: Diagnosis not present

## 2023-04-17 DIAGNOSIS — I5032 Chronic diastolic (congestive) heart failure: Secondary | ICD-10-CM | POA: Diagnosis not present

## 2023-04-17 DIAGNOSIS — G4733 Obstructive sleep apnea (adult) (pediatric): Secondary | ICD-10-CM | POA: Diagnosis not present

## 2023-04-17 DIAGNOSIS — J471 Bronchiectasis with (acute) exacerbation: Secondary | ICD-10-CM | POA: Diagnosis not present

## 2023-04-17 DIAGNOSIS — G4736 Sleep related hypoventilation in conditions classified elsewhere: Secondary | ICD-10-CM | POA: Diagnosis not present

## 2023-04-17 NOTE — Telephone Encounter (Signed)
 Pt advised that FMLA paperwork for her son, Coraline Talwar has been completed and faxed. Pt asked if copy can be mailed to her home. Copy placed in mail today. Mjp,lpn

## 2023-04-18 DIAGNOSIS — M069 Rheumatoid arthritis, unspecified: Secondary | ICD-10-CM | POA: Diagnosis not present

## 2023-04-18 DIAGNOSIS — Z79899 Other long term (current) drug therapy: Secondary | ICD-10-CM | POA: Diagnosis not present

## 2023-04-18 DIAGNOSIS — Z1331 Encounter for screening for depression: Secondary | ICD-10-CM | POA: Diagnosis not present

## 2023-04-18 DIAGNOSIS — E119 Type 2 diabetes mellitus without complications: Secondary | ICD-10-CM | POA: Diagnosis not present

## 2023-04-18 DIAGNOSIS — H40013 Open angle with borderline findings, low risk, bilateral: Secondary | ICD-10-CM | POA: Diagnosis not present

## 2023-04-18 DIAGNOSIS — Z961 Presence of intraocular lens: Secondary | ICD-10-CM | POA: Diagnosis not present

## 2023-04-18 DIAGNOSIS — Z1231 Encounter for screening mammogram for malignant neoplasm of breast: Secondary | ICD-10-CM | POA: Diagnosis not present

## 2023-04-18 DIAGNOSIS — H04123 Dry eye syndrome of bilateral lacrimal glands: Secondary | ICD-10-CM | POA: Diagnosis not present

## 2023-04-18 DIAGNOSIS — Z779 Other contact with and (suspected) exposures hazardous to health: Secondary | ICD-10-CM | POA: Diagnosis not present

## 2023-04-18 LAB — HM DIABETES EYE EXAM

## 2023-04-18 LAB — HM MAMMOGRAPHY

## 2023-04-19 DIAGNOSIS — J189 Pneumonia, unspecified organism: Secondary | ICD-10-CM | POA: Diagnosis not present

## 2023-04-19 DIAGNOSIS — A419 Sepsis, unspecified organism: Secondary | ICD-10-CM | POA: Diagnosis not present

## 2023-04-19 DIAGNOSIS — J9611 Chronic respiratory failure with hypoxia: Secondary | ICD-10-CM | POA: Diagnosis not present

## 2023-04-19 DIAGNOSIS — J470 Bronchiectasis with acute lower respiratory infection: Secondary | ICD-10-CM | POA: Diagnosis not present

## 2023-04-19 DIAGNOSIS — J4550 Severe persistent asthma, uncomplicated: Secondary | ICD-10-CM | POA: Diagnosis not present

## 2023-04-19 DIAGNOSIS — I5022 Chronic systolic (congestive) heart failure: Secondary | ICD-10-CM | POA: Diagnosis not present

## 2023-04-19 DIAGNOSIS — R6520 Severe sepsis without septic shock: Secondary | ICD-10-CM | POA: Diagnosis not present

## 2023-04-19 DIAGNOSIS — E1122 Type 2 diabetes mellitus with diabetic chronic kidney disease: Secondary | ICD-10-CM | POA: Diagnosis not present

## 2023-04-19 DIAGNOSIS — I130 Hypertensive heart and chronic kidney disease with heart failure and stage 1 through stage 4 chronic kidney disease, or unspecified chronic kidney disease: Secondary | ICD-10-CM | POA: Diagnosis not present

## 2023-04-21 IMAGING — CT CT RENAL STONE PROTOCOL
2 of 4 series · 16 of 46 positions shown, 18 images · non-contrast
Comparison: None.

CLINICAL DATA: Urinary tract stone, symptomatic/complicated.
Elevated creatinine

EXAM:
CT ABDOMEN AND PELVIS WITHOUT CONTRAST
TECHNIQUE: Multidetector CT imaging of the abdomen and pelvis was performed
following the standard protocol without IV contrast.

[Series 2: axial st · axial · 0.75mm/px · z∈[+911,+1316]mm · 13 of 93 slices shown, 15 images]
[im 6/93  soft-tissue]
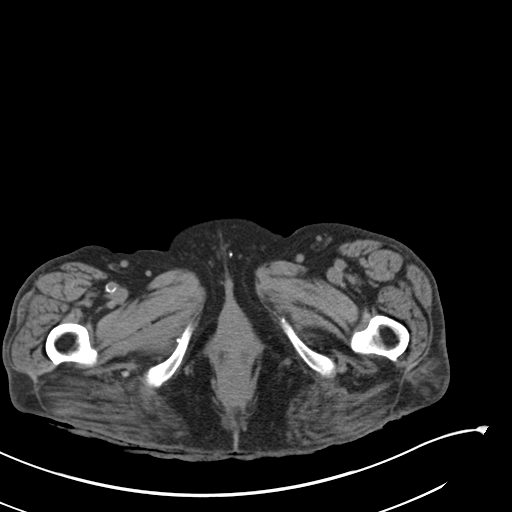
[im 6/93  bone]
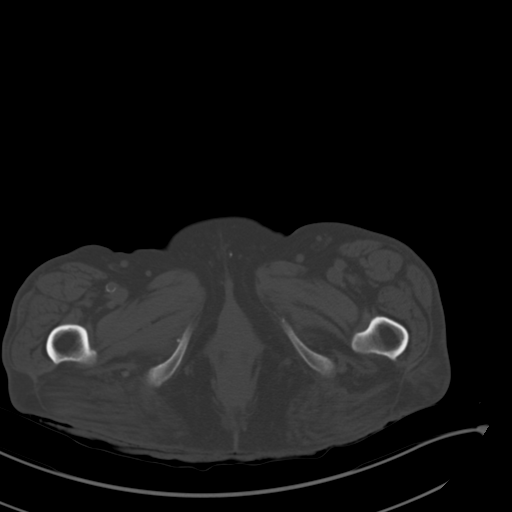
[im 11/93  soft-tissue]
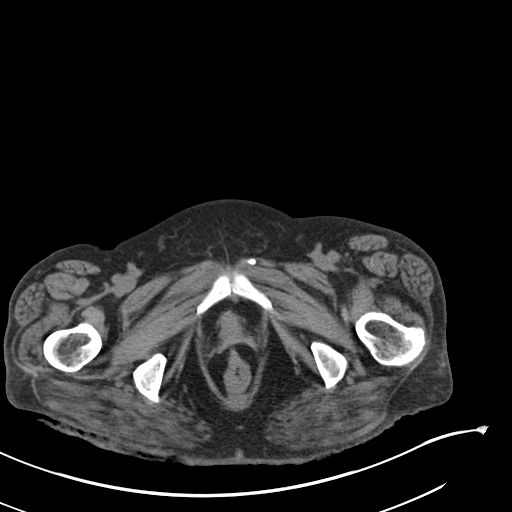
[im 22/93  soft-tissue]
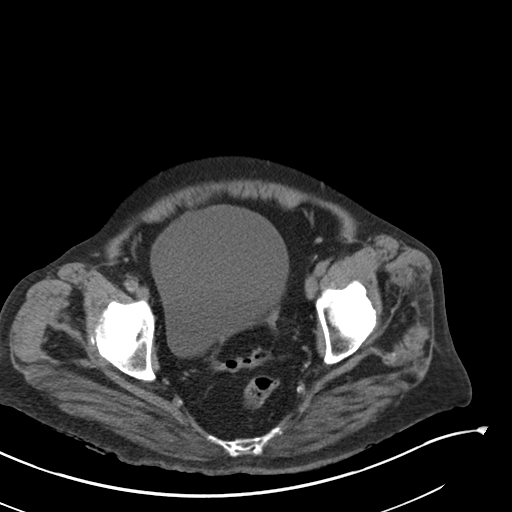
[im 28/93  soft-tissue]
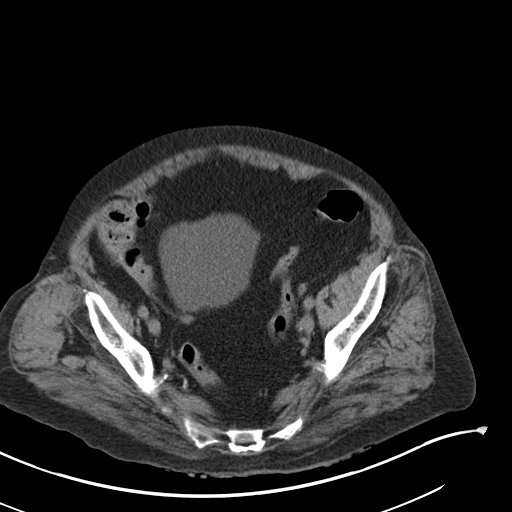
[im 33/93  soft-tissue]
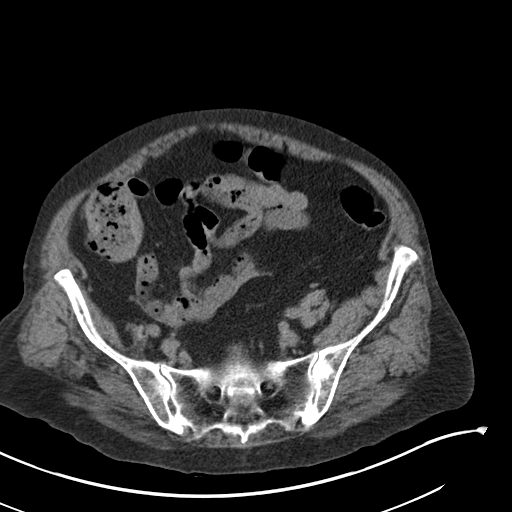
[im 38/93  soft-tissue]
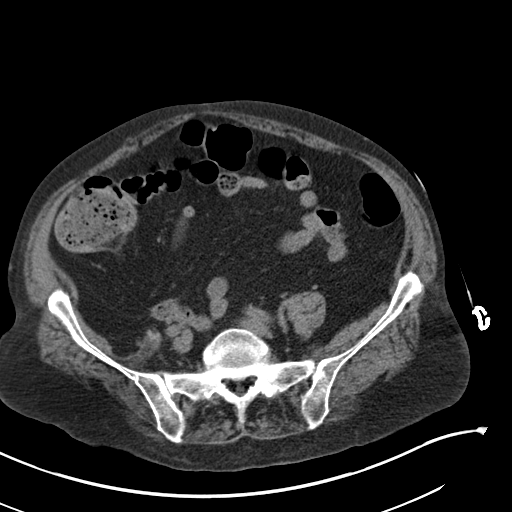
[im 49/93  soft-tissue]
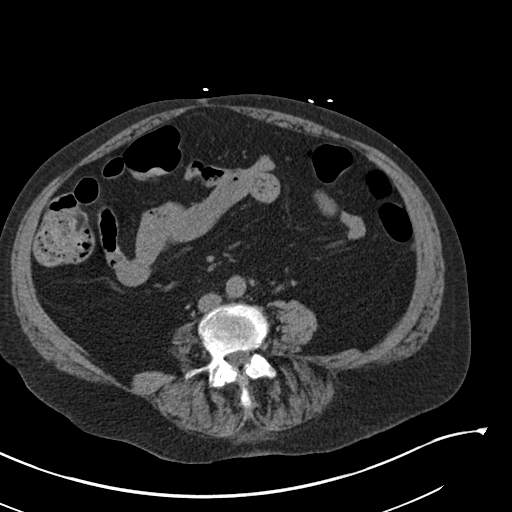
[im 55/93  soft-tissue]
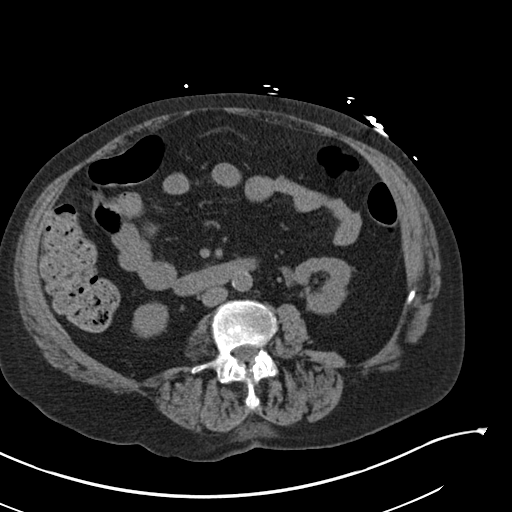
[im 60/93  soft-tissue]
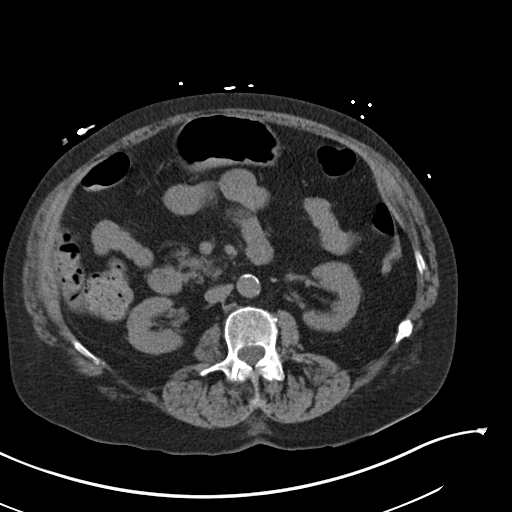
[im 60/93  bone]
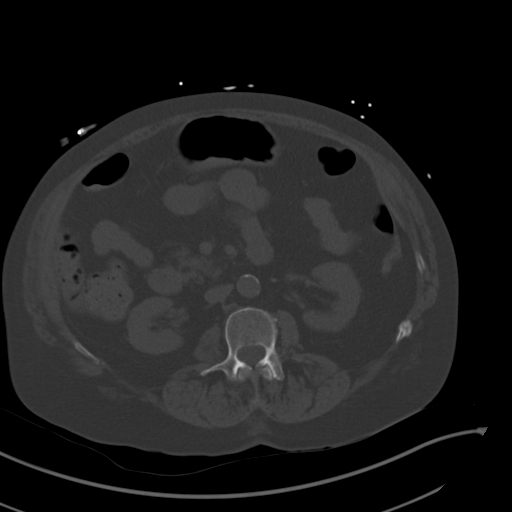
[im 65/93  soft-tissue]
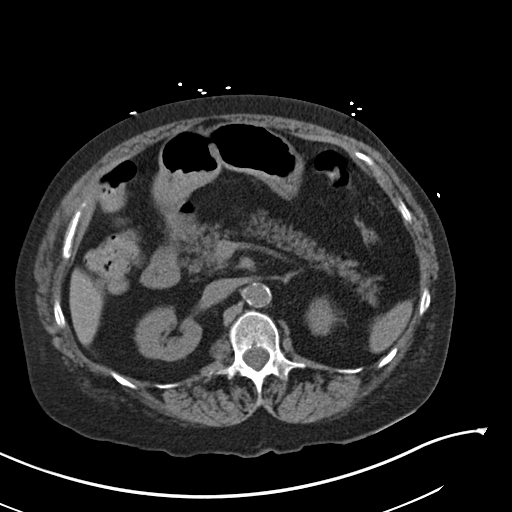
[im 71/93  soft-tissue]
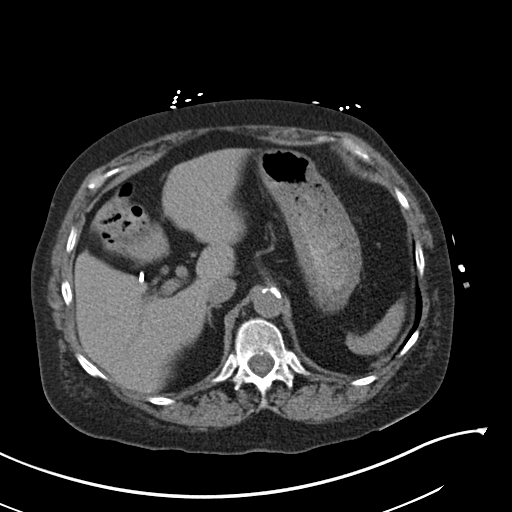
[im 82/93  soft-tissue]
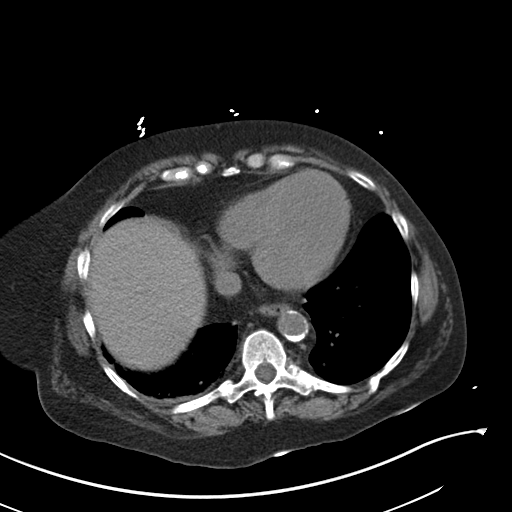
[im 87/93  soft-tissue]
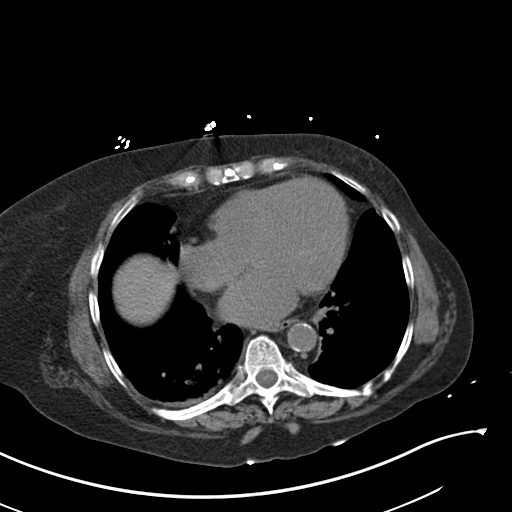

[Series 4: coronal · coronal · 0.86mm/px · 3 of 150 slices shown]
[im 50/150  soft-tissue]
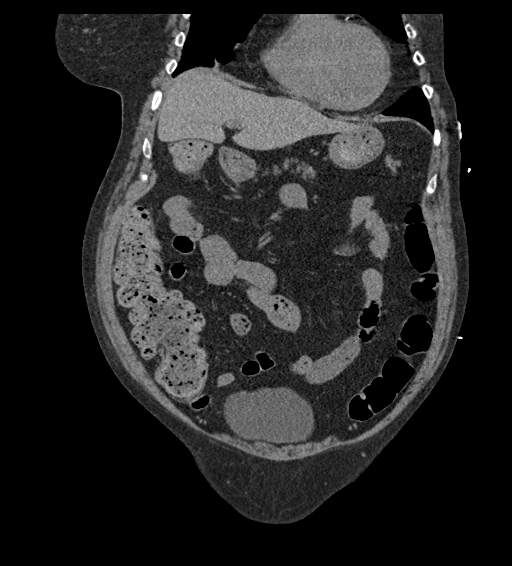
[im 67/150  soft-tissue]
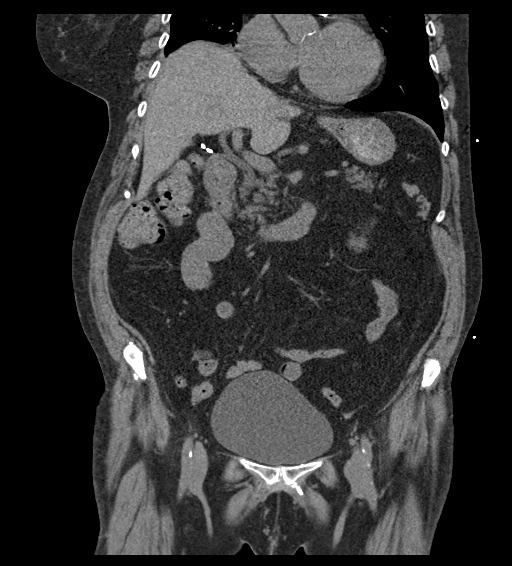
[im 83/150  soft-tissue]
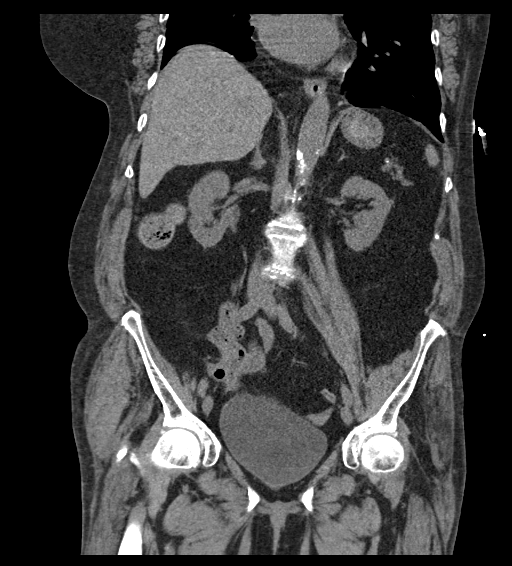

[16 of 46 positions shown; findings below may reference images not displayed]

FINDINGS: Lower chest: Coronary artery calcifications.  Bibasilar scarring.

Hepatobiliary: No focal liver abnormality. Status post
cholecystectomy. No biliary dilatation.

Pancreas: No focal lesion. Normal pancreatic contour. No surrounding
inflammatory changes. No main pancreatic ductal dilatation.

Spleen: Normal in size without focal abnormality.

Adrenals/Urinary Tract:

No adrenal nodule bilaterally.

Bilateral renal cortical scarring and atrophy. No nephrolithiasis
and no hydronephrosis. No definite contour-deforming renal mass.

No ureterolithiasis or hydroureter.

The urinary bladder is unremarkable.

Stomach/Bowel: Stomach is within normal limits. No evidence of bowel
wall thickening or dilatation. Couple scattered colonic diverticula.
Appendix appears normal.

Vascular/Lymphatic: No abdominal aorta or iliac aneurysm. Mild
atherosclerotic plaque of the aorta and its branches. No abdominal,
pelvic, or inguinal lymphadenopathy.

Reproductive: Status post hysterectomy. No adnexal masses.

Other: No intraperitoneal free fluid. No intraperitoneal free gas.
No organized fluid collection.

Musculoskeletal:

Tiny fat containing umbilical hernia. Asymmetrically atrophic right
iliopsoas muscle.

No suspicious lytic or blastic osseous lesions. No acute displaced
fracture. Grade 1 anterolisthesis of L4 on L5 with associated
degenerative changes.
IMPRESSION: 1. No acute intra-abdominal or intrapelvic abnormality with limited
evaluation on this noncontrast study.
2. Couple scattered colon diverticula with no acute diverticulitis.
3. Atrophic scarred kidneys.
4.  Aortic Atherosclerosis ([id number]).

## 2023-04-21 IMAGING — DX DG CHEST 1V PORT
1 series · 1 of 1 positions shown · non-contrast
Comparison: 09/22/2020

CLINICAL DATA: Possible fluid overload with increased shortness of
breath, initial encounter

EXAM:
PORTABLE CHEST 1 VIEW

[chest ap]
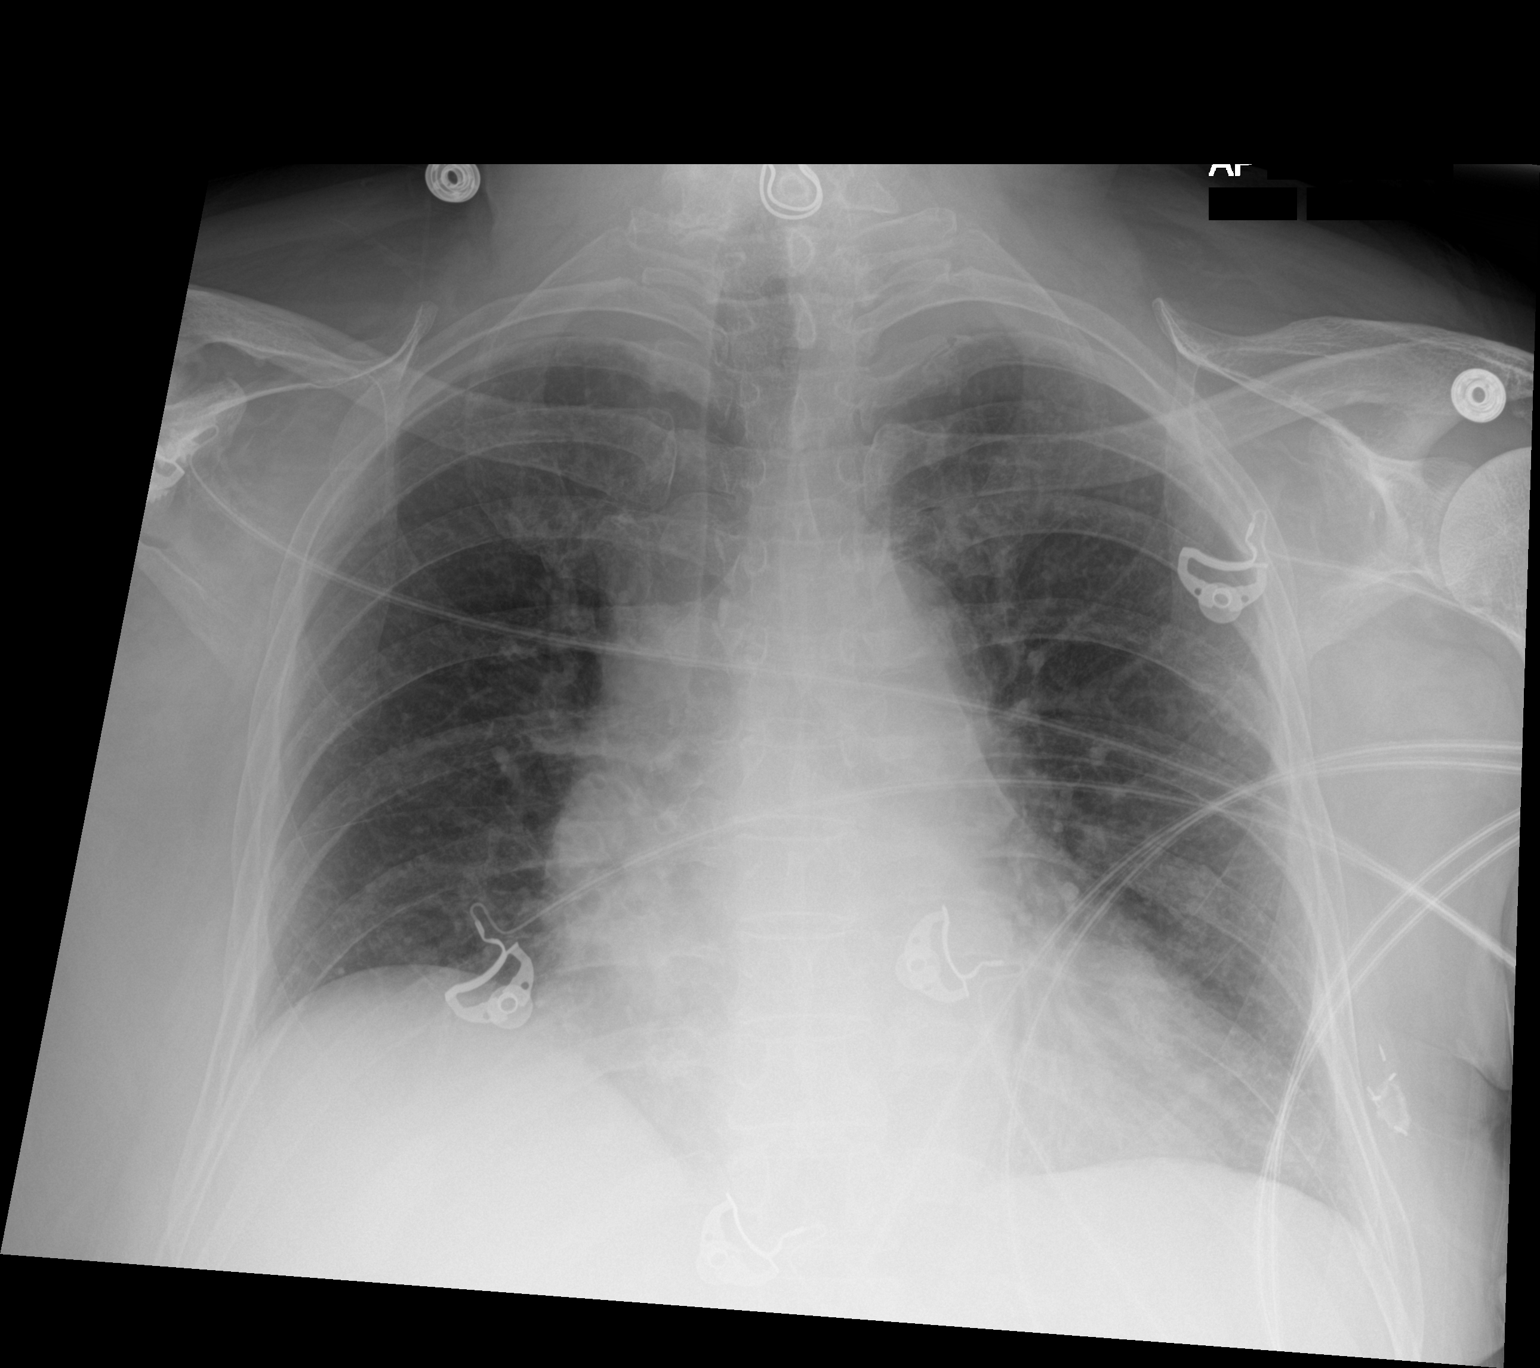

[1 of 1 positions shown; findings below may reference images not displayed]

FINDINGS: Postsurgical changes are again noted. Cardiac shadow is enlarged but
stable. Lungs are clear. No significant vascular congestion is
noted. No bony abnormality is noted.
IMPRESSION: No findings to suggest fluid overload.

## 2023-04-23 IMAGING — DX DG CHEST 2V
2 series · 2 of 2 positions shown · non-contrast
Comparison: 10/04/2020

CLINICAL DATA: Acute renal failure, shortness of breath

EXAM:
CHEST - 2 VIEW

[chest lat]
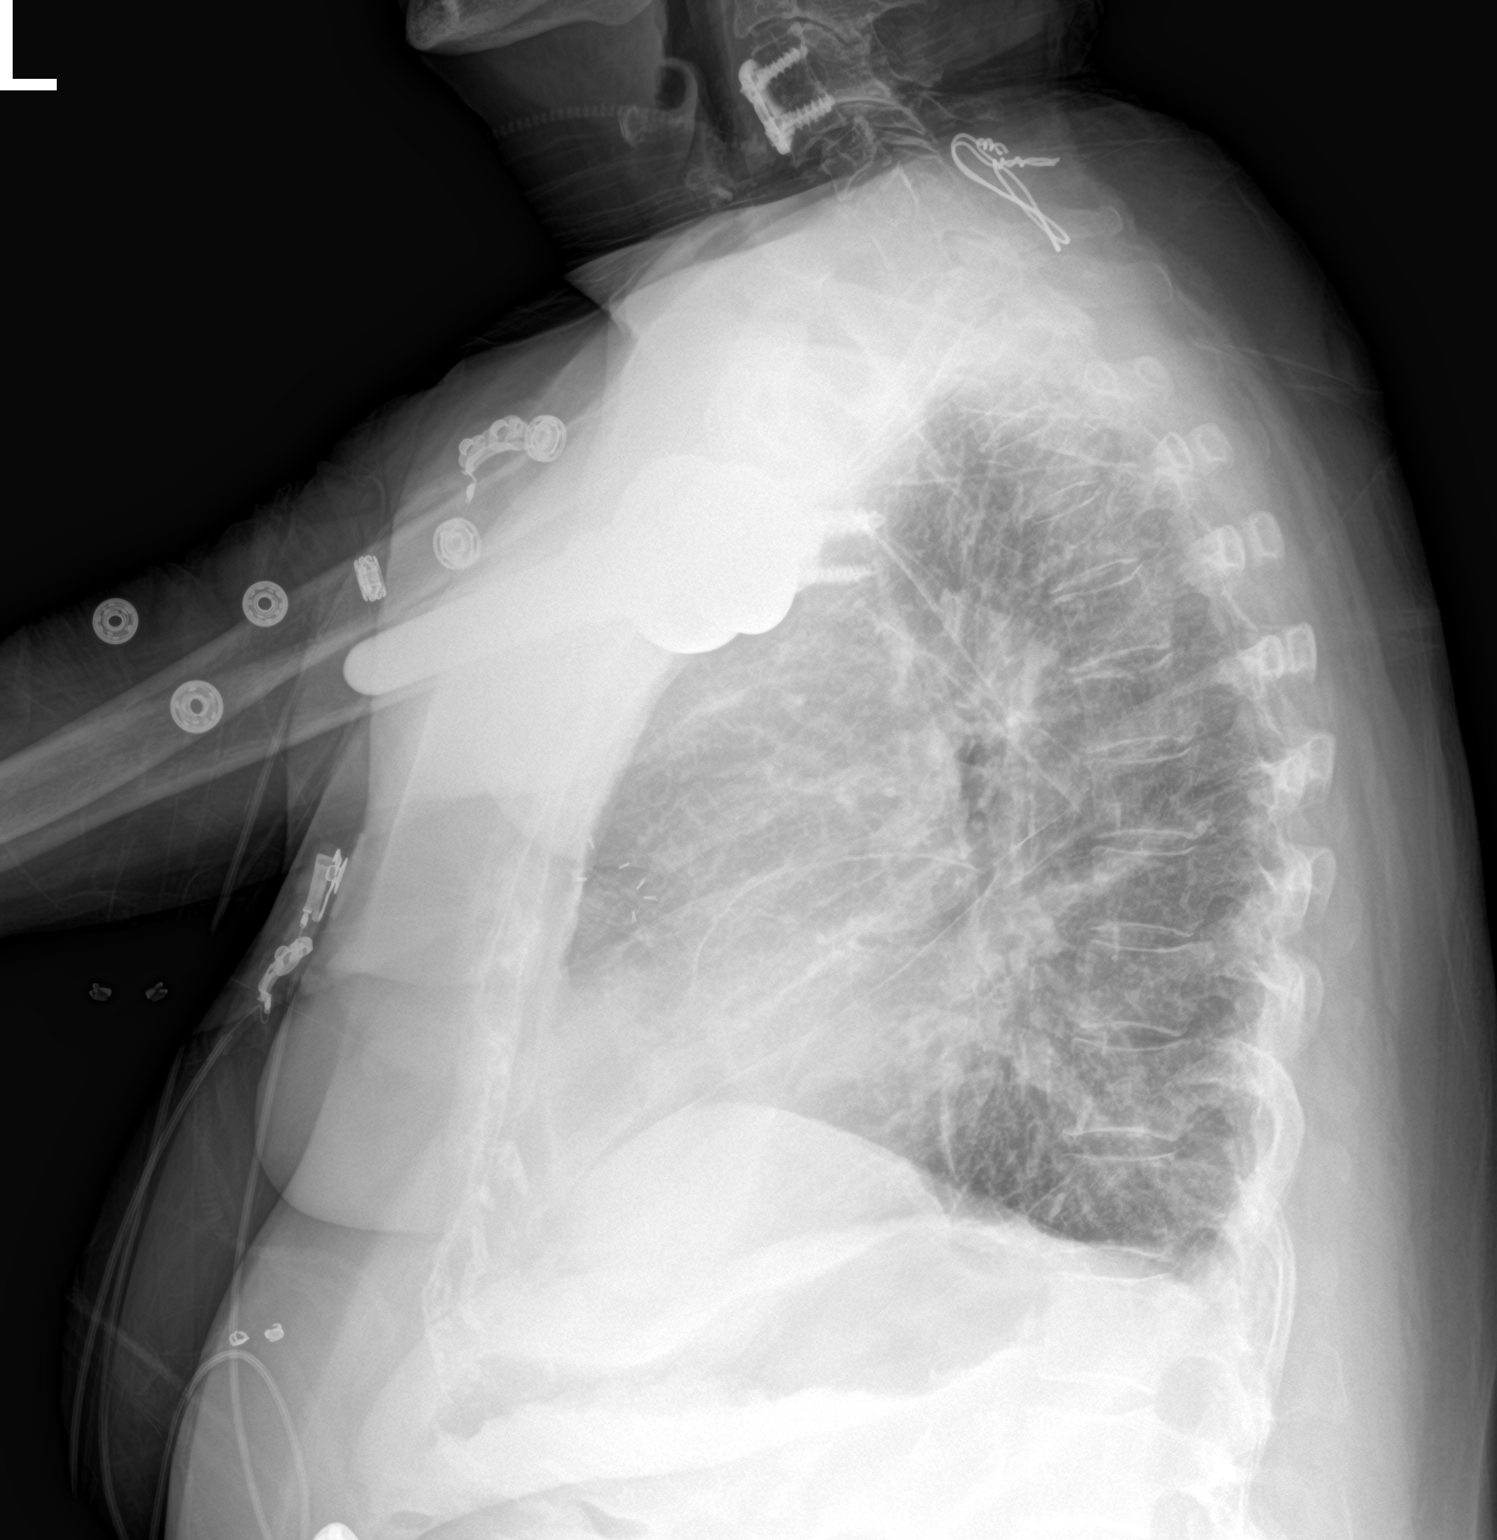

[chest ap]
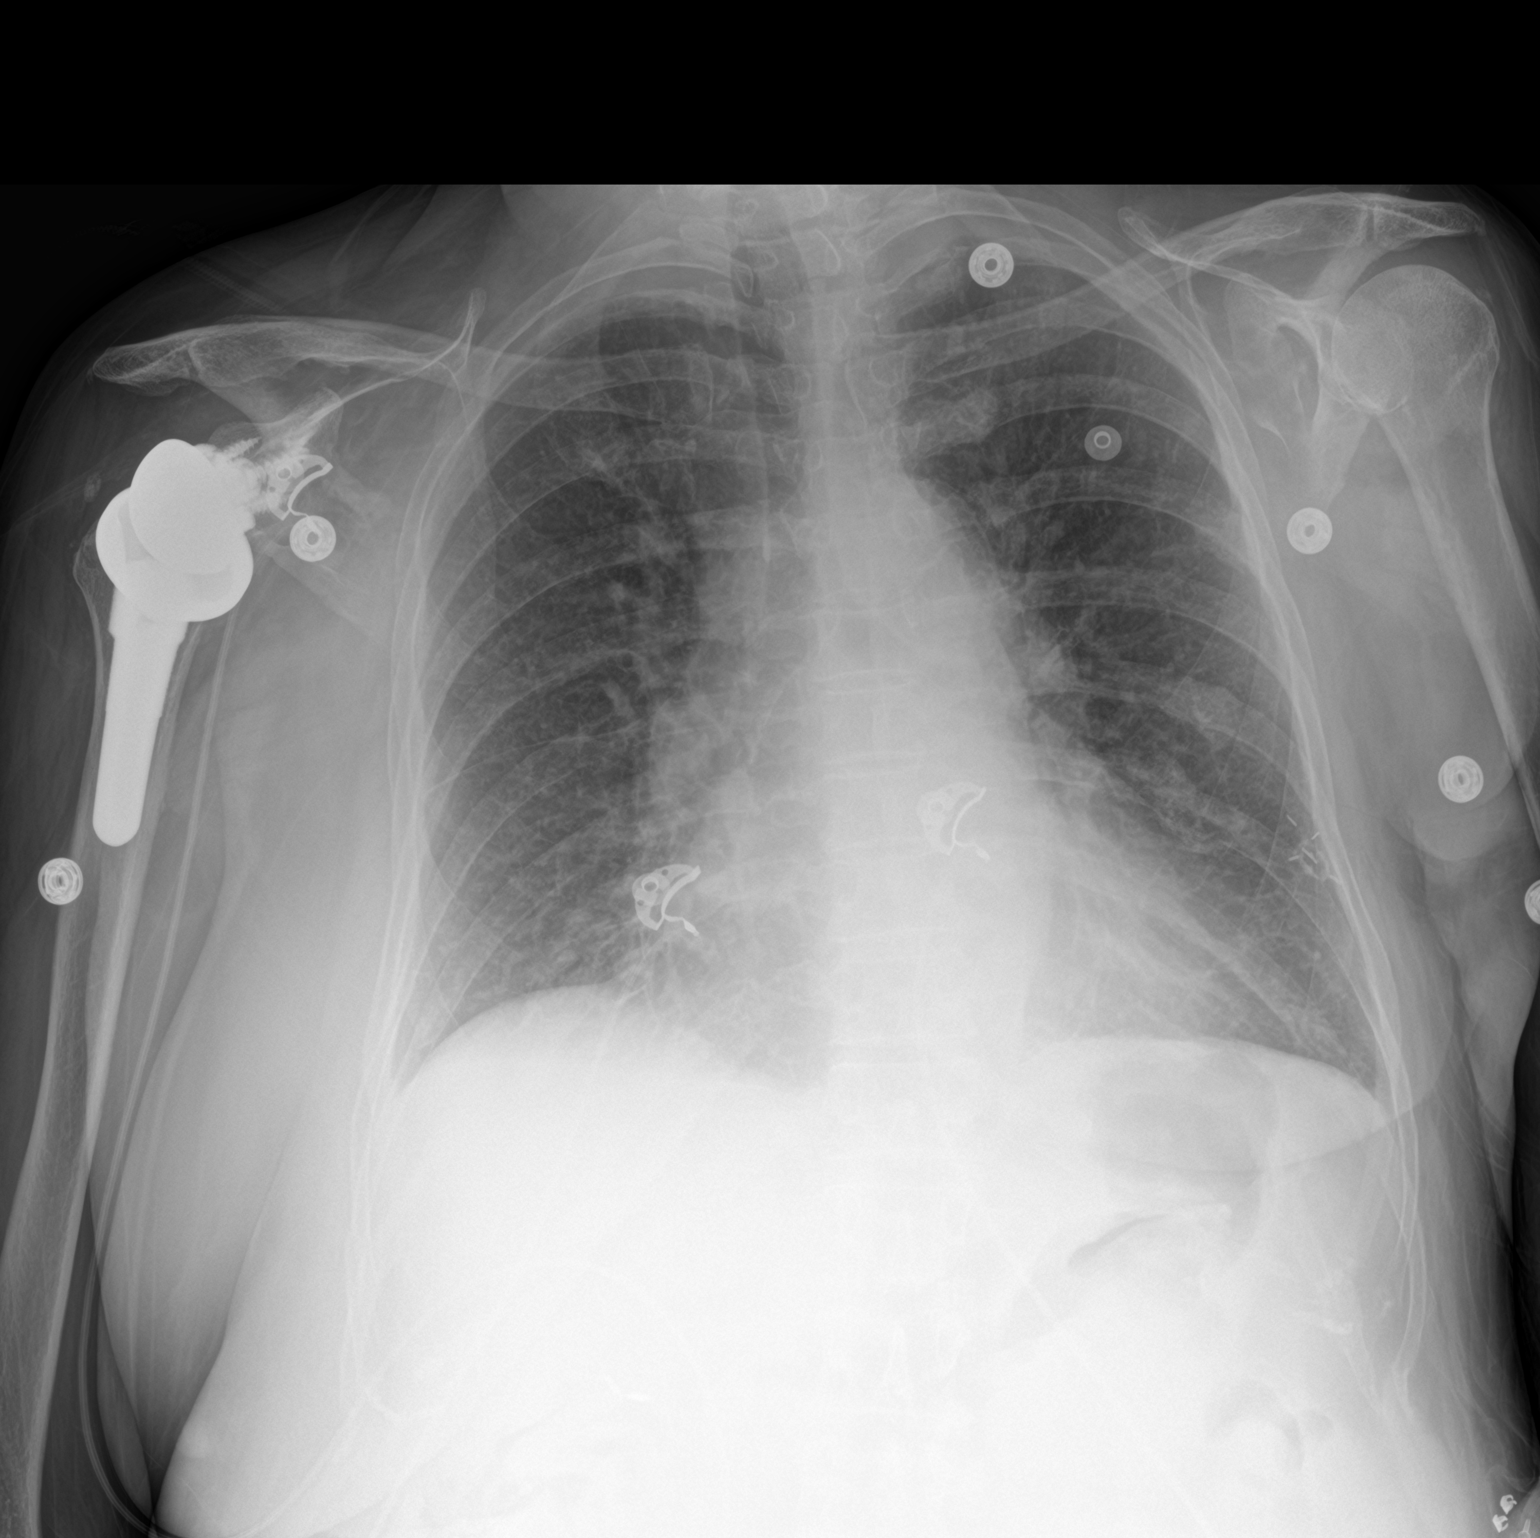

[2 of 2 positions shown; findings below may reference images not displayed]

FINDINGS: Mild enlargement of cardiac silhouette unchanged.

Mediastinal contours and pulmonary vascularity normal.

Slight chronic accentuation of interstitial markings.

No acute infiltrate, pleural effusion or pneumothorax.

Bones demineralized with postsurgical changes of cervical spine
fusion and reverse RIGHT shoulder arthroplasty.
IMPRESSION: Enlargement of cardiac silhouette.

No acute abnormalities.

## 2023-04-24 ENCOUNTER — Telehealth: Payer: Self-pay

## 2023-04-24 NOTE — Telephone Encounter (Signed)
 Copied from CRM [id number]. Topic: General - Other >> Apr 24, 2023  2:33 PM [place] B wrote: Reason for CRM: pt states hosp requested for pt to ask for a chest xray from her having pneumonia.

## 2023-04-25 ENCOUNTER — Other Ambulatory Visit: Payer: Self-pay

## 2023-04-25 DIAGNOSIS — J4551 Severe persistent asthma with (acute) exacerbation: Secondary | ICD-10-CM | POA: Diagnosis not present

## 2023-04-25 DIAGNOSIS — J189 Pneumonia, unspecified organism: Secondary | ICD-10-CM

## 2023-04-26 DIAGNOSIS — A419 Sepsis, unspecified organism: Secondary | ICD-10-CM | POA: Diagnosis not present

## 2023-04-26 DIAGNOSIS — J189 Pneumonia, unspecified organism: Secondary | ICD-10-CM | POA: Diagnosis not present

## 2023-04-26 DIAGNOSIS — J4550 Severe persistent asthma, uncomplicated: Secondary | ICD-10-CM | POA: Diagnosis not present

## 2023-04-26 DIAGNOSIS — I5022 Chronic systolic (congestive) heart failure: Secondary | ICD-10-CM | POA: Diagnosis not present

## 2023-04-26 DIAGNOSIS — J470 Bronchiectasis with acute lower respiratory infection: Secondary | ICD-10-CM | POA: Diagnosis not present

## 2023-04-26 DIAGNOSIS — R6520 Severe sepsis without septic shock: Secondary | ICD-10-CM | POA: Diagnosis not present

## 2023-04-26 DIAGNOSIS — J9611 Chronic respiratory failure with hypoxia: Secondary | ICD-10-CM | POA: Diagnosis not present

## 2023-04-26 DIAGNOSIS — E1122 Type 2 diabetes mellitus with diabetic chronic kidney disease: Secondary | ICD-10-CM | POA: Diagnosis not present

## 2023-04-26 DIAGNOSIS — I130 Hypertensive heart and chronic kidney disease with heart failure and stage 1 through stage 4 chronic kidney disease, or unspecified chronic kidney disease: Secondary | ICD-10-CM | POA: Diagnosis not present

## 2023-04-29 NOTE — Progress Notes (Signed)
 Office Visit Note  Patient: Ebony Scott             Date of Birth: 1950-12-15           MRN: [id number]             PCP: [name], MD Referring: [name], MD Visit Date: 05/13/2023   Subjective:  Follow-up (Patient states her right pinky is sore and hurts. )   Discussed the use of AI scribe software for clinical note transcription with the patient, who gave verbal consent to proceed.  History of Present Illness   Ebony Scott is a 73 year old female with seropositive rheumatoid arthritis and osteoarthritis on prednisone 7 mg daily who presents with joint pains, recent fall, and recent respiratory infections.  She experiences persistent joint pain, particularly in her fingers, with one finger that 'hurts all the time.' An injection in the finger last year did not provide significant relief. Shoulder pain, previously treated with an injection, was initially helpful but has since returned.  Regarding her rheumatoid arthritis, there have been no major flare-ups or joint swelling recently, with symptoms described as 'just the usual.' She is currently taking 50 mg of tramadol daily, which she finds helpful, and also takes Tylenol 650 mg, two tablets in the morning and two in the evening.  She has been experiencing recurrent pneumonia, with the most recent episode occurring within the past couple of months. She is currently on a prednisone dose of seven milligrams, which has raised concerns about her susceptibility to infections.  She experienced a fall recently when she slipped in the mud, but reports no other recent falls. No shooting pain down her arm or finger triggering. She also denies pain in her thumb or other fingers, except for the one that is sore.   Recent labs reviewed 05/02/23 CBC okay CMP eGFR 43 Rest okay    Previous HPI 12/11/2022 Ebony Scott is a 73 y.o. 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  Unfortunate since the  last visit she had pneumonia twice with exacerbation of her chronic bronchitis and is currently at facility for rehab after hospitalization.  Steroid injection to the finger at her last visit did not help much with regards to pain swelling or joint mobility.  She still noticing some redness and sometimes warmth to the joints near the tips of her fingers.  Worst problem currently is left shoulder pain.  She has difficulty reaching overhead also has pain worse at nighttime sometimes disrupting sleep.  Does not have symptoms radiating down the arm.   Previous HPI 09/10/2022 Ebony Scott is a 73 y.o. 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  She had recent hospitalization for fevers and hypotension without clear infectious source identified but concern of HCAP as this followed shortly after ICD placement on 7/8. She was treated with rocephin and azithromycin and transitioned to cefadroxil and azithromycin at discharge. She completed antibiotics but still has some diarrhea with frequent, watery stools though without any blood or mucus contents. She thinks the left shoulder steroid injection in April helped, but is currently limited unable to move her shoulder much after the recent ICD placement. Worst joint at present is right 5th PIP joint is very painful with movement and sometimes at rest.   Previous HPI 05/22/2022 Ebony Scott is a 73 y.o. female here for follow up for seropositive RA  on prednisone 7 mg daily and we started hydroxychloroquine 200 mg daily.  Unfortunately did not see any significant benefit to joint inflammation with additional medication added.  She did get sick with community-acquired pneumonia resulting in hospitalization on March 6.  Respiratory status improved with treatment but unfortunately had a prolonged course 10 days until discharge.  Then had to go to short-term at rehab facility before returning home last weekend.  She was visited by home health  physical therapy once on Easter Sunday but has not had any additional follow-up or treatment.  She feels overall weak and mobility is much worse compared to prior to hospitalization.  Also developed new erythematous skin rash involving abdomen and neck.   Previous HPI 03/07/22 Ebony Scott is a 73 y.o. 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 She has increased joint pain in multiple areas especially bilateral hands also left shoulder and right knee. She has seen only a small benefit in symptoms so far after the knee viscosupplementation shots last year. She has pain pretty much all the time both morning and night time. She notices some worsening of lateral deviation in the small finger on her right hand.    Previous HPI 11/29/21 Ebony Scott is a 73 y.o. female here for follow up for seronegative RA and cushing's syndrome currently on long term prednisone. She is on hizentra for hypogammaglobulinemia. Since last visit she had a bone marrow biopsy for macrocytosis with benign findings. She is having an exacerbation of asthma with wheezing and coughing increased since early September and did not improve well with a prolonged steroid taper. Subsequently has not been prescribed azithromycin and higher dose steroid starting about 5 days ago so far still having a lot of coughing and symptoms. She notices increased joint pain in hands and knees during this episode. She completed a series of 3 viscosupplementation injections in the left knee about 2 weeks ago with a partial benefit.   Previous HPI 08/09/2021 Ebony Scott is a 73 y.o. female here for follow up or seronegative RA and cushing's syndrome on prednisone with gradual tapering but increased back to 7 mg daily after last visit.  She is having some increased joint pain at multiple areas but worst problem is increased pain in her left knee.  This is severe enough to limit her tolerance for weightbearing and mobility.  She is  continuing treatments for hypogammaglobulinemia with the Hizentra subcu weekly.  She has not had any severe interval infections.  She is scheduled for hammertoe corrective surgery next month.   Previous HPI 04/28/21 Ebony Scott is a 73 y.o. female here for follow up for seronegative RA and cushing's syndrome on prednisone with gradual tapering from 8 mg in December. She developed worsening iron deficiency anemia after GI bleed getting venofer replacement. Joint and skin problems were remaining pretty stable until about 2 weeks ot a month ago. This was after decreasing prednisone to 6 mg daily dose. Knee pain limits her mobility but hands have the most increase in swelling. Skin rash on the back of her neck and throughout her low back. She tried topical hydrocortisone without a large improvement so far.   Previous HPI 01/23/21 Ebony Scott is a 73 y.o. female here for follow up for seronegative RA minimally responsive to multiple DMARD treatments on prednisone 10 mg PO daily with acquired cushing's syndrome. She has noticed some increase in right hand MCP joint swelling but overall  joints not dramatically changed with dose reduction. Since our visit she went for coronary artery stent placement after study for evaluating cardiomyopathy found significant stenosis. No anginal symptoms, she is not sure if any different but husband reports noticing some improvement in her dyspnea on exertion. She has had numerous other issues increased upper airway congestion and drainage. Sometimes productive coughing with this. She has had mild nasal bleeding with rhinitis and and sneezing issues.   Previous HPI 11/28/20 Ebony Scott is a 73 y.o. female here for iatrogenic cushing syndrome and history of rheumatoid arthritis with long term prednisone treatment, previously seen by Dr. Deanne Coffer at [facility] for this problem. Mostly had been taking treatments after hospitalization with severe joint inflammation in 2020. She has tried  taking numerous DMARD treatments for years including actemra, simponi, abatacept, and rituximab so far without great response or tolerance. Chronic prednisone required for symptoms throughout this time but has taken these longstanding for asthma disease already. She has had some issue with recurrent infections with pulmonary and septic bursitis and more recently noninfectious allergy symptoms managed with Dr. Lucie Leather and Francine Graven. She started on IVIG treatments with Dr. Lucie Leather for this identified recently, not sure if primary or related to previous medication   DMARD Hx Leflunomide Hydroxychloroquine Rituximab Abatacept Simponi Actemra IV   Review of Systems  Constitutional:  Positive for fatigue.  HENT:  Positive for mouth sores and mouth dryness.   Eyes:  Positive for dryness.  Respiratory:  Positive for shortness of breath.   Cardiovascular:  Negative for chest pain and palpitations.  Gastrointestinal:  Negative for blood in stool, constipation and diarrhea.  Endocrine: Negative for increased urination.  Genitourinary:  Negative for involuntary urination.  Musculoskeletal:  Positive for myalgias and myalgias. Negative for joint pain, gait problem, joint pain, joint swelling, muscle weakness, morning stiffness and muscle tenderness.  Skin:  Negative for color change, rash, hair loss and sensitivity to sunlight.  Allergic/Immunologic: Positive for susceptible to infections.  Neurological:  Negative for dizziness and headaches.  Hematological:  Negative for swollen glands.  Psychiatric/Behavioral:  Positive for sleep disturbance. Negative for depressed mood. The patient is not nervous/anxious.     PMFS History:  Patient Active Problem List   Diagnosis Date Noted   PNA (pneumonia) 03/29/2023   Acute bronchitis 03/26/2023   CAP (community acquired pneumonia) 11/30/2022   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   Sepsis (HCC) 11/30/2022   Fever 09/01/2022   Lethargy 08/31/2022    Hypotension 08/31/2022   DNR (do not resuscitate) 08/31/2022   Abnormal CT of the abdomen 04/26/2022   Itching of ear 04/10/2022   Effusion, left knee 08/09/2021   Noninfectious otitis externa of left ear 06/08/2021   Rash and other nonspecific skin eruption 04/28/2021   Iron deficiency anemia 02/24/2021   GAVE (gastric antral vascular ectasia)    Symptomatic anemia 02/04/2021   Anticoagulated    Antiplatelet or antithrombotic long-term use    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   Coronary artery calcification seen on CAT scan 01/11/2021   Acute kidney injury superimposed on chronic kidney disease (HCC) 10/04/2020   Physical deconditioning 09/25/2020   History of DVT (deep vein thrombosis) 09/24/2020   DMII (diabetes mellitus, type 2) (HCC) 09/23/2020   Cellulitis of right hand 09/22/2020   Abscess of dorsum of right hand 09/22/2020   Hoarseness 09/09/2020   Metabolic encephalopathy 02/24/2020   Acute encephalopathy    AMS (altered mental status) 02/23/2020   S/P reverse total shoulder arthroplasty, right  Hypokalemia    DOE (dyspnea on exertion) 02/18/2020   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   S/P reverse total shoulder arthroplasty, left 02/16/2020   Preoperative clearance 01/26/2020   Closed displaced fracture of metatarsal bone of right foot 01/20/2019   History of pulmonary embolus (PE) 12/19/2018   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   Rheumatoid arthritis (HCC)    Bilateral impacted cerumen 08/18/2018   Fungal otitis externa 08/18/2018   Cerumen debris on tympanic membrane of right ear 08/18/2018   Asthmatic bronchitis with exacerbation 07/29/2018   Pulmonary embolism (HCC) 07/29/2018   Cellulitis of forearm    Fever of unknown origin (FUO) 02/10/2018   Diarrhea 02/10/2018   Abdominal pain 02/10/2018   Fracture of base of fifth metatarsal bone with routine healing, left    Closed fracture of fifth metatarsal bone of right foot, initial  encounter 01/15/2018   DVT (deep venous thrombosis) (HCC) 01/14/2018   Hypothyroidism 01/14/2018   Depression 01/14/2018   Debility    Protein-calorie malnutrition, moderate (HCC) 12/17/2017   Knee pain    History of breast cancer    Chronic pain syndrome    Fibromyalgia    Graves disease    Vasculitis (HCC) 12/13/2017   Rhabdomyolysis 12/13/2017   Dysphonia 08/10/2017   Pulmonary nodules    Chronic kidney disease, stage 3b (HCC) 04/15/2016   Hx of adenomatous colonic polyps 04/12/2016   Cough 02/09/2016   Opacity of lung on imaging study 11/03/2015   Severe persistent asthma 02/08/2015   Facial pain syndrome 02/08/2015   Insomnia 02/08/2015   Other allergic rhinitis 02/08/2015   LPRD (laryngopharyngeal reflux disease) 02/08/2015   Chronic systolic CHF (congestive heart failure) (HCC) 02/08/2015   Diastolic dysfunction 02/08/2015   Benign paroxysmal positional vertigo 12/03/2012   Disequilibrium 10/29/2012   Pseudogout 02/24/2012   Irritable bowel syndrome 01/02/2010   Hyperlipidemia 12/23/2009   Hyperthyroidism 11/12/2006   Seasonal and perennial allergic rhinitis 11/12/2006   GERD 11/12/2006   NECK PAIN, CHRONIC 11/12/2006   OSTEOPOROSIS 11/12/2006   BREAST CANCER, HX OF 11/12/2006    Past Medical History:  Diagnosis Date   Anemia    Angio-edema    Breast cancer (HCC) 1998   Left breast, in remission   Bronchiectasis (HCC)    CHF (congestive heart failure) (HCC)    Clostridium difficile colitis 01/14/2018   Coronary artery disease    Depression    "some; don't take anything for it" (02/20/2012), pt denies dx as of 06/02/21   Diverticulosis    DVT (deep venous thrombosis) (HCC)    Fibromyalgia 11/2011   GAVE (gastric antral vascular ectasia)    GERD (gastroesophageal reflux disease)    Headache(784.0)    "related to allergies; more at different times during the year" (02/20/2012)   Hemorrhoids    Hiatal hernia    back and neck   History of blood transfusion  2023   1 unit, 5 units of Iron   Hx of adenomatous colonic polyps 04/12/2016   Hypercholesteremia    good cholesterol is high   Hypothyroidism    h/o Graves disease   IBS (irritable bowel syndrome)    Moderate persistent asthma    -FeV1 72% 2011, -IgE 102 2011, CT sinus Neg 2011   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 Casimer Lanius  Sleep apnea    uses cpap nightly   Tracheobronchomalacia     Family History  Problem Relation Age of Onset   Allergies Mother    Heart disease Mother    Arthritis Mother    Lung cancer Mother        heavy smoker   Diabetes Mother    Allergies Father    Heart disease Father    Arthritis Father    Stroke Father    Heart disease Brother    Diabetes Maternal Grandfather    Colitis Daughter    Colon cancer Other        Maternal half uncle   Past Surgical History:  Procedure Laterality Date   ABDOMINAL HYSTERECTOMY N/A    Phreesia 10/21/2019   ANTERIOR AND POSTERIOR REPAIR  1990's   APPENDECTOMY     ARGON LASER APPLICATION  02/05/2021   Procedure: ARGON LASER APPLICATION;  Surgeon: Benancio Deeds, MD;  Location: [facility];  Service: Gastroenterology;;   BREAST LUMPECTOMY  1998   left   BREAST SURGERY N/A    Phreesia 10/21/2019   BRONCHIAL WASHINGS  04/05/2020   Procedure: BRONCHIAL WASHINGS;  Surgeon: Martina Sinner, MD;  Location: [facility];  Service: Pulmonary;;   CAPSULOTOMY Right 08/28/2021   Procedure: CAPSULOTOMY;  Surgeon: Candelaria Stagers, DPM;  Location: [facility];  Service: Podiatry;  Laterality: Right;   CARPOMETACARPEL (CMC) FUSION OF THUMB WITH AUTOGRAFT FROM RADIUS  ~ 2009   "both thumbs" (02/20/2012)   CATARACT EXTRACTION W/ INTRAOCULAR LENS  IMPLANT, BILATERAL  2012   CERVICAL DISCECTOMY  10/2001   C5-C6   CERVICAL FUSION  2003   C3-C4    CHOLECYSTECTOMY     COLONOSCOPY     CORONARY STENT INTERVENTION N/A 01/11/2021   Procedure: CORONARY STENT INTERVENTION;  Surgeon: Marykay Lex, MD;  Location[facility];  Service: Cardiovascular;  Laterality: N/A;   DEBRIDEMENT TENNIS ELBOW  ?1970's   right   ESOPHAGOGASTRODUODENOSCOPY     ESOPHAGOGASTRODUODENOSCOPY (EGD) WITH PROPOFOL N/A 02/05/2021   Procedure: ESOPHAGOGASTRODUODENOSCOPY (EGD) WITH PROPOFOL;  Surgeon: Benancio Deeds, MD;  Location: [facility];  Service: Gastroenterology;  Laterality: N/A;   EYE SURGERY N/A    Phreesia 10/21/2019   HAMMER TOE SURGERY Right 08/28/2021   Procedure: HAMMER TOE REPAIR 2ND TOE RIGHT FOOT;  Surgeon: Candelaria Stagers, DPM;  Location: [facility];  Service: Podiatry;  Laterality: Right;   HYSTERECTOMY     I & D EXTREMITY Right 09/22/2020   Procedure: IRRIGATION AND DEBRIDEMENT RIGHT HAND AND ELBOW;  Surgeon: Teryl Lucy, MD;  Location: [facility];  Service: Orthopedics;  Laterality: Right;   ICD IMPLANT N/A 08/27/2022   Procedure: ICD IMPLANT;  Surgeon: Maurice Small, MD;  Location: [facility];  Service: Cardiovascular;  Laterality: N/A;   KNEE ARTHROPLASTY  ?1990's   "?right; w/cartilage repair" (02/20/2012)   MASS EXCISION Left 08/28/2021   Procedure: EXCISION OF SOFT TISSUE  MASS LEFT FOOT;  Surgeon: Candelaria Stagers, DPM;  Location: [facility];  Service: Podiatry;  Laterality: Left;   NASAL SEPTUM SURGERY  1980's   POSTERIOR CERVICAL FUSION/FORAMINOTOMY  2004   "failed initial fusion; rewired  anterior neck" (02/20/2012)   REVERSE SHOULDER ARTHROPLASTY Right 02/16/2020   Procedure: REVERSE SHOULDER ARTHROPLASTY;  Surgeon: Teryl Lucy, MD;  Location: [facility];  Service: Orthopedics;  Laterality: Right;   RIGHT/LEFT HEART CATH AND CORONARY ANGIOGRAPHY N/A 01/11/2021   Procedure: RIGHT/LEFT HEART CATH AND CORONARY ANGIOGRAPHY;  Surgeon: Marykay Lex, MD;  Location[facility] INVASIVE CV LAB;  Service: Cardiovascular;  Laterality: N/A;    SPINE SURGERY N/A    Phreesia 10/21/2019   TONSILLECTOMY  ~ 1953   VESICOVAGINAL FISTULA CLOSURE W/ TAH  1988   VIDEO BRONCHOSCOPY Bilateral 08/23/2016   Procedure: VIDEO BRONCHOSCOPY WITH FLUORO;  Surgeon: Roslynn Amble, MD;  Location: [facility];  Service: Cardiopulmonary;  Laterality: Bilateral;   VIDEO BRONCHOSCOPY N/A 04/05/2020   Procedure: VIDEO BRONCHOSCOPY WITHOUT FLUORO;  Surgeon: Martina Sinner, MD;  Location: [facility];  Service: Pulmonary;  Laterality: N/A;   Social History   Social History Narrative   Patient lives at home alone. Patient  divorced.    Patient has her BS degree.   Right handed.   Caffeine- sometimes coffee.      [facility]:   Born in [place], [place]. She worked as a Armed forces operational officer. She has no pets currently. She does have indoor plants. Previously had mold in her home that was remediated. Carpet was removed.          Immunization History  Administered Date(s) Administered   DTaP 08/18/2013   Fluad Quad(high Dose 65+) 10/16/2018, 11/20/2019, 11/03/2020   Hepatitis A 09/04/2007, 03/02/2008   Hepatitis B 01/07/1985, 02/06/1985, 08/17/1985   Influenza Split 11/13/2010, 11/22/2011, 10/20/2012   Influenza Whole 11/14/2009, 11/21/2011   Influenza, High Dose Seasonal PF 11/09/2015, 11/27/2017   Influenza, Quadrivalent, Recombinant, Inj, Pf 11/20/2019   Influenza,inj,Quad PF,6+ Mos 11/06/2013, 11/09/2014, 11/06/2016   Influenza-Unspecified 11/06/2021, 10/12/2022   Meningococcal Conjugate 09/04/2007   Moderna Covid-19 Fall Seasonal Vaccine 12yrs & older 01/08/2022   PFIZER(Purple Top)SARS-COV-2 Vaccination 03/12/2019, 04/01/2019, 12/16/2019, 06/29/2020   Pfizer Covid-19 Vaccine Bivalent Booster 23yrs & up 03/02/2021   Pneumococcal Conjugate-13 05/18/2013   Pneumococcal Polysaccharide-23 01/05/1994, 11/28/2011, 05/27/2017   Respiratory Syncytial Virus Vaccine,Recomb Aduvanted(Arexvy) 11/06/2021   Td 07/24/1995, 03/16/2005   Tdap  08/18/2013   Zoster Recombinant(Shingrix) 09/18/2017   Zoster, Live 03/02/2008     Objective: Vital Signs: BP (!) 105/55 (BP Location: Right Arm, Patient Position: Sitting, Cuff Size: Normal)   Pulse 72   Resp 14   Ht 5' (1.524 m)   Wt 130 lb (59 kg)   BMI 25.39 kg/m    Physical Exam Eyes:     Conjunctiva/sclera: Conjunctivae normal.  Cardiovascular:     Rate and Rhythm: Normal rate and regular rhythm.  Pulmonary:     Effort: Pulmonary effort is normal.     Breath sounds: Normal breath sounds.     Comments: Bibasilar inspiratory crackles Lymphadenopathy:     Cervical: No cervical adenopathy.  Skin:    General: Skin is warm and dry.     Findings: Bruising present.  Neurological:     Mental Status: She is alert.  Psychiatric:        Mood and Affect: Mood normal.      Musculoskeletal Exam:  Left shoulder abduction mildly limited, tenderness to pressure and movement worse on posterior, no palpable effusion Elbows full ROM no tenderness or swelling Wrists full ROM no tenderness or swelling Fingers chronic bony joint changes proximal and distal, right 5th PIP widening and lateral deviation with decreased flexion ROM, reversible 1st MCP subluxation Knees full ROM, no swelling, right knee minimally tender anteriorly without visible bruising  Investigation: No additional findings.  Imaging: DG Chest 2 View Result Date: 05/02/2023 CLINICAL DATA:  Follow-up pneumonia EXAM: CHEST - 2 VIEW COMPARISON:  March 31, 2023 FINDINGS: Resolved pulmonary infiltrates. No acute  infiltrates consolidations or pulmonary masses. No pleural effusions or reactions Heart and mediastinum normal. No change compared with prior examination postsurgical staples in the left anterior lung. Total right shoulder arthroplasty No change in the left subclavian bipolar ICDF pacemaker device tip of the leads in good position no evidence of discontinuity Electronically Signed   By: Shaaron Adler M.D.   On:  05/02/2023 14:28    Recent Labs: Lab Results  Component Value Date   WBC 9.5 05/02/2023   HGB 10.3 (L) 05/02/2023   PLT 171 05/02/2023   NA 140 05/02/2023   K 4.8 05/02/2023   CL 107 05/02/2023   CO2 24 05/02/2023   GLUCOSE 148 (H) 05/02/2023   BUN 29 (H) 05/02/2023   CREATININE 1.30 (H) 05/02/2023   BILITOT 0.6 05/02/2023   ALKPHOS 46 03/30/2023   AST 39 (H) 05/02/2023   ALT 50 (H) 05/02/2023   PROT 6.8 05/02/2023   ALBUMIN 2.3 (L) 03/30/2023   CALCIUM 9.6 05/02/2023   GFRAA 40 (L) 08/25/2020    Speciality Comments: PLQ Eye Exam: 04/18/2023 WNL @[facility] f/u 1 year  Procedures:  Large Joint Inj: L glenohumeral on 05/13/2023 1:25 PM Indications: pain Details: 27 G 1.5 in needle, posterior approach Medications: 2 mL lidocaine 1 %; 40 mg triamcinolone acetonide 40 MG/ML Outcome: tolerated well, no immediate complications Procedure, treatment alternatives, risks and benefits explained, specific risks discussed. Consent was given by the patient. Immediately prior to procedure a time out was called to verify the correct patient, procedure, equipment, support staff and site/side marked as required. Patient was prepped and draped in the usual sterile fashion.     Allergies: Baclofen, Other, Pork-derived products, Shellfish allergy, Shrimp extract, Tetracycline hcl, Xolair [omalizumab], Zoledronic acid, Celecoxib, Dilaudid [hydromorphone hcl], Hydrocodone-acetaminophen, Levofloxacin, Molds & smuts, Morphine and codeine, Oxycodone hcl, Paroxetine, Penicillins, Tetracycline hcl, Oxycodone-acetaminophen, Diltiazem hcl, Dust mite extract, Gamunex [immune globulin], Penicillin g procaine, Rituximab, Rofecoxib, and Tree extract   Assessment / Plan:     Visit Diagnoses: Rheumatoid arthritis with negative rheumatoid factor, involving unspecified site (HCC) - S/P Large Joint Inj: L glenohumeral on 12/11/2022 - Plan: predniSONE (DELTASONE) 5 MG tablet Continued joint pain in multiple  areas describes some more inflammatory episodes in the fingers but looks about baseline on exam today with chronic deformity and Heberden's nodes. Not a good candidate for additional DMARD treatment with immunodeficiency and recurrent infections. Plan to reduce to 5 mg to decrease infection risk. - Taper prednisone from 7 mg to 5 mg, starting with 6 mg for 1-2 weeks before reducing to 5 mg.  Long term (current) use of systemic steroids - prednisone 7 mg daily Rash and other nonspecific skin eruption - triamcinolone 0.1%  Diarrhea, unspecified type - Recommend addition of oral probiotic as well as active cultures supplement such as yogurt or kefir.  Osteoarthritis Chronic osteoarthritis in fingers and shoulder with persistent shoulder pain. Previous shoulder injection was beneficial. - Administer corticosteroid injection in the left shoulder.    Orders: Orders Placed This Encounter  Procedures   Large Joint Inj   Meds ordered this encounter  Medications   predniSONE (DELTASONE) 5 MG tablet    Sig: Take 1 tablet (5 mg total) by mouth daily with breakfast.    Dispense:  90 tablet    Refill:  0     Follow-Up Instructions: Return in about 3 months (around 08/13/2023) for RA on GC f/u 3mos.   Fuller Plan, MD  Note - This record  has been created using [employer].  Chart creation errors have been sought, but may not always  have been located. Such creation errors do not reflect on  the standard of medical care.

## 2023-05-01 DIAGNOSIS — J4550 Severe persistent asthma, uncomplicated: Secondary | ICD-10-CM | POA: Diagnosis not present

## 2023-05-01 DIAGNOSIS — E1122 Type 2 diabetes mellitus with diabetic chronic kidney disease: Secondary | ICD-10-CM | POA: Diagnosis not present

## 2023-05-01 DIAGNOSIS — J189 Pneumonia, unspecified organism: Secondary | ICD-10-CM | POA: Diagnosis not present

## 2023-05-01 DIAGNOSIS — A419 Sepsis, unspecified organism: Secondary | ICD-10-CM | POA: Diagnosis not present

## 2023-05-01 DIAGNOSIS — J470 Bronchiectasis with acute lower respiratory infection: Secondary | ICD-10-CM | POA: Diagnosis not present

## 2023-05-01 DIAGNOSIS — J9611 Chronic respiratory failure with hypoxia: Secondary | ICD-10-CM | POA: Diagnosis not present

## 2023-05-01 DIAGNOSIS — R6520 Severe sepsis without septic shock: Secondary | ICD-10-CM | POA: Diagnosis not present

## 2023-05-01 DIAGNOSIS — I5022 Chronic systolic (congestive) heart failure: Secondary | ICD-10-CM | POA: Diagnosis not present

## 2023-05-01 DIAGNOSIS — I130 Hypertensive heart and chronic kidney disease with heart failure and stage 1 through stage 4 chronic kidney disease, or unspecified chronic kidney disease: Secondary | ICD-10-CM | POA: Diagnosis not present

## 2023-05-02 ENCOUNTER — Ambulatory Visit: Payer: Medicare PPO | Admitting: Family Medicine

## 2023-05-02 ENCOUNTER — Encounter: Payer: Self-pay | Admitting: Family Medicine

## 2023-05-02 ENCOUNTER — Ambulatory Visit
Admission: RE | Admit: 2023-05-02 | Discharge: 2023-05-02 | Disposition: A | Discharge status: Home / Self Care | Source: Ambulatory Visit | Attending: Family Medicine | Admitting: Family Medicine

## 2023-05-02 VITALS — BP 114/56 | HR 73 | Temp 98.9°F | Ht 60.0 in | Wt 132.4 lb

## 2023-05-02 DIAGNOSIS — I5022 Chronic systolic (congestive) heart failure: Secondary | ICD-10-CM | POA: Diagnosis not present

## 2023-05-02 DIAGNOSIS — J189 Pneumonia, unspecified organism: Secondary | ICD-10-CM

## 2023-05-02 DIAGNOSIS — D649 Anemia, unspecified: Secondary | ICD-10-CM | POA: Diagnosis not present

## 2023-05-02 NOTE — Progress Notes (Signed)
 Subjective:    Patient ID: Ebony Scott, female    DOB: April 02, 1950, 73 y.o.   MRN: [id number] Admit date: 03/29/2023  Discharge date: 04/01/2023   Admitted From: Home   Disposition:  Home     Recommendations for Outpatient Follow-up:    Follow up with PCP in 1-2 weeks   PCP Please obtain BMP/CBC, 2 view CXR in 1week,  (see Discharge instructions)    PCP Please follow up on the following pending results:      Home Health: PT   Equipment/Devices: as below  Consultations: None  Discharge Condition: Stable    CODE STATUS: Full    Diet Recommendation: Heart Healthy Low Carb, 1.5 L fluid restriction per day        Chief Complaint  Patient presents with   Weakness      Brief history of present illness from the day of admission and additional interim summary     73 y.o.  female with history of immunosuppression (chronic prednisone for RA/weekly IVIG for hypogammaglobulinemia)-chronic HFrEF-s/p ICD, CAD, bronchiectasis/asthma with chronic hypoxic respiratory failure on nocturnal oxygen-admitted on 2/7 for severe sepsis with AKI due to PNA.   Significant events: 2/4>> fever/cough-seen by PCP-influenza PCR negative-started on Zithromax/prednisone. 2/7>> weakness/fever/cough-to ED-found to be hypotensive-AKI-pneumonia on CT imaging-admit to [facility].   Significant studies: 2/7>> CT chest/abdomen/pelvis: Multifocal pneumonia-right lung.   Significant microbiology data: 2/4>> influenza A/B PCR: Negative 2/7>> COVID/influenza/RSV PCR: Negative 2/7>> respiratory virus panel: Negative 2/7>> blood culture: No growth.                                                                  Hospital Course    Severe sepsis likely secondary to PNA-?aspiration   most likely secondary to nausea vomiting she had prior to admission, possibly aspirated some, was placed on broad-spectrum IV antibiotics along with midodrine and stress dose steroids, supplemental oxygen was increased and she was provided  supportive care.  She responded very well to the treatment, now transition to oral antibiotics and oral prednisone which she takes at home, currently on 1 L nasal cannula oxygen at rest uses between 2 and 3 L as needed at home, she is currently symptom-free eager to go home.  Will be placed on 3 more days of oral antibiotics, she has been cleared by speech therapy, has no ongoing aspiration.  Follow-up with PCP within a week for follow-up.   AKI on CKD stage IIIa Suspect AKI hemodynamically mediated Back to baseline.   Chronic HFrEF (EF 30-35% by TEE on May 2024) S/p ICD implantation July 2024 Remains euvolemic on exam Continue home medications unchanged.   History of CAD s/p PCI to LAD in 2022 No anginal symptoms Continue Plavix/statin   HTN Blood pressure stable continue home medications.   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   Hypomagnesemia.  Replaced.     History of RA Chronically on prednisone 7 mg daily which will be resumed upon discharge.   Hypogammaglobinemia/chronic immune deficiency On IVIG treatments per immunology as an outpatient.   Debility/deconditioning PT/OT/SLP eval pending.   DM-2 (A1C 6.0 on Oct  2024) Home regimen.  PCP to monitor.   Discharge diagnosis       Principal Problem:   PNA (pneumonia) Active Problems:   Rheumatoid arthritis (HCC)   Hypogammaglobulinemia (HCC)   Chronic systolic CHF (congestive heart failure) (HCC)   DMII (diabetes mellitus, type 2) (HCC)   GERD   Severe persistent asthma   Pulmonary embolism (HCC)   05/02/23 Patient is here today with her daughter for follow-up.  She states her breathing is at her baseline.  She is afebrile today.  Her lungs today sound clear.  I do not appreciate any crackles or rails.  She  had a repeat chest x-ray this morning that has not been officially read however I still see patchy opacities in the right lung and the left lung.  This appears roughly the same as what was seen in the hospital on February 7.  However she had an acute drop in her hemoglobin while in the hospital.  She typically runs around 11 and her hemoglobin was down into the 8 range.  She has a history of GI bleed and she is currently on Plavix and Xarelto. Past Medical History:  Diagnosis Date   Anemia    Angio-edema    Breast cancer (HCC) 1998   Left breast, in remission   Bronchiectasis (HCC)    CHF (congestive heart failure) (HCC)    Clostridium difficile colitis 01/14/2018   Coronary artery disease    Depression    "some; don't take anything for it" (02/20/2012), pt denies dx as of 06/02/21   Diverticulosis    DVT (deep venous thrombosis) (HCC)    Fibromyalgia 11/2011   GAVE (gastric antral vascular ectasia)    GERD (gastroesophageal reflux disease)    Headache(784.0)    "related to allergies; more at different times during the year" (02/20/2012)   Hemorrhoids    Hiatal hernia    back and neck   History of blood transfusion 2023   1 unit, 5 units of Iron   Hx of adenomatous colonic polyps 04/12/2016   Hypercholesteremia    good cholesterol is high   Hypothyroidism    h/o Graves disease   IBS (irritable bowel syndrome)    Moderate persistent asthma    -FeV1 72% 2011, -IgE 102 2011, CT sinus Neg 2011   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 Casimer Lanius   Sleep apnea    uses cpap nightly   Tracheobronchomalacia    Past Surgical History:  Procedure Laterality Date   ABDOMINAL HYSTERECTOMY N/A    Phreesia 10/21/2019   ANTERIOR AND POSTERIOR REPAIR  1990's   APPENDECTOMY     ARGON LASER APPLICATION  02/05/2021    Procedure: ARGON LASER APPLICATION;  Surgeon: Benancio Deeds, MD;  Location: [facility];  Service: Gastroenterology;;   BREAST LUMPECTOMY  1998   left   BREAST SURGERY N/A    Phreesia 10/21/2019   BRONCHIAL WASHINGS  04/05/2020   Procedure: BRONCHIAL WASHINGS;  Surgeon: Martina Sinner, MD;  Location: [facility];  Service: Pulmonary;;   CAPSULOTOMY Right 08/28/2021   Procedure: CAPSULOTOMY;  Surgeon: Candelaria Stagers, DPM;  Location: [facility];  Service: Podiatry;  Laterality: Right;   CARPOMETACARPEL (CMC) FUSION OF THUMB WITH AUTOGRAFT FROM RADIUS  ~ 2009   "both thumbs" (02/20/2012)  CATARACT EXTRACTION W/ INTRAOCULAR LENS  IMPLANT, BILATERAL  2012   CERVICAL DISCECTOMY  10/2001   C5-C6   CERVICAL FUSION  2003   C3-C4   CHOLECYSTECTOMY     COLONOSCOPY     CORONARY STENT INTERVENTION N/A 01/11/2021   Procedure: CORONARY STENT INTERVENTION;  Surgeon: Marykay Lex, MD;  Location: [facility] INVASIVE CV LAB;  Service: Cardiovascular;  Laterality: N/A;   DEBRIDEMENT TENNIS ELBOW  ?1970's   right   ESOPHAGOGASTRODUODENOSCOPY     ESOPHAGOGASTRODUODENOSCOPY (EGD) WITH PROPOFOL N/A 02/05/2021   Procedure: ESOPHAGOGASTRODUODENOSCOPY (EGD) WITH PROPOFOL;  Surgeon: Benancio Deeds, MD;  Location: [facility];  Service: Gastroenterology;  Laterality: N/A;   EYE SURGERY N/A    Phreesia 10/21/2019   HAMMER TOE SURGERY Right 08/28/2021   Procedure: HAMMER TOE REPAIR 2ND TOE RIGHT FOOT;  Surgeon: Candelaria Stagers, DPM;  Location: [facility];  Service: Podiatry;  Laterality: Right;   HYSTERECTOMY     I & D EXTREMITY Right 09/22/2020   Procedure: IRRIGATION AND DEBRIDEMENT RIGHT HAND AND ELBOW;  Surgeon: Teryl Lucy, MD;  Location: [facility];  Service: Orthopedics;  Laterality: Right;   ICD IMPLANT N/A 08/27/2022   Procedure: ICD IMPLANT;  Surgeon: Maurice Small, MD;  Location: [facility];  Service: Cardiovascular;  Laterality: N/A;   KNEE ARTHROPLASTY  ?1990's   "?right; w/cartilage  repair" (02/20/2012)   MASS EXCISION Left 08/28/2021   Procedure: EXCISION OF SOFT TISSUE  MASS LEFT FOOT;  Surgeon: Candelaria Stagers, DPM;  Location: [facility];  Service: Podiatry;  Laterality: Left;   NASAL SEPTUM SURGERY  1980's   POSTERIOR CERVICAL FUSION/FORAMINOTOMY  2004   "failed initial fusion; rewired  anterior neck" (02/20/2012)   REVERSE SHOULDER ARTHROPLASTY Right 02/16/2020   Procedure: REVERSE SHOULDER ARTHROPLASTY;  Surgeon: Teryl Lucy, MD;  Location: [facility];  Service: Orthopedics;  Laterality: Right;   RIGHT/LEFT HEART CATH AND CORONARY ANGIOGRAPHY N/A 01/11/2021   Procedure: RIGHT/LEFT HEART CATH AND CORONARY ANGIOGRAPHY;  Surgeon: Marykay Lex, MD;  Location: [facility] INVASIVE CV LAB;  Service: Cardiovascular;  Laterality: N/A;   SPINE SURGERY N/A    Phreesia 10/21/2019   TONSILLECTOMY  ~ 1953   VESICOVAGINAL FISTULA CLOSURE W/ TAH  1988   VIDEO BRONCHOSCOPY Bilateral 08/23/2016   Procedure: VIDEO BRONCHOSCOPY WITH FLUORO;  Surgeon: Roslynn Amble, MD;  Location: [facility];  Service: Cardiopulmonary;  Laterality: Bilateral;   VIDEO BRONCHOSCOPY N/A 04/05/2020   Procedure: VIDEO BRONCHOSCOPY WITHOUT FLUORO;  Surgeon: Martina Sinner, MD;  Location: [facility];  Service: Pulmonary;  Laterality: N/A;   Current Outpatient Medications on File Prior to Visit  Medication Sig Dispense Refill   acetaminophen (TYLENOL) 500 MG tablet Take 1,000 mg by mouth as needed for moderate pain.     albuterol (PROVENTIL) (2.5 MG/3ML) 0.083% nebulizer solution Take 3 mLs (2.5 mg total) by nebulization every 4 (four) hours as needed for wheezing or shortness of breath. 150 mL 1   albuterol (VENTOLIN HFA) 108 (90 Base) MCG/ACT inhaler Inhale 2 puffs into the lungs every 6 (six) hours as needed for wheezing or shortness of breath. 18 g 1   allopurinol (ZYLOPRIM) 100 MG tablet Take 1 tablet (100 mg total) by mouth daily. 90 tablet 0   Alpha-D-Galactosidase (BEANO PO) Take 2-3 tablets by mouth 3  (three) times daily as needed (Gas).     Alpha-Lipoic Acid 600 MG TABS Take 600 mg by mouth in the morning.     atorvastatin (LIPITOR)  80 MG tablet Take 1 tablet (80 mg total) by mouth every evening. 90 tablet 1   azelastine (ASTELIN) 0.1 % nasal spray Place 2 sprays into both nostrils daily as needed for rhinitis. Use in each nostril as directed 90 mL 1   cetirizine (ZYRTEC) 10 MG tablet Take 1 tablet (10 mg total) by mouth daily as needed for allergies (Can take a n extra dose during flare ups.). 180 tablet 1   Cholecalciferol (VITAMIN D3) 25 MCG (1000 UT) capsule Take 1,000 Units by mouth at bedtime.     clopidogrel (PLAVIX) 75 MG tablet Take 1 tablet (75 mg total) by mouth daily. 90 tablet 0   denosumab (PROLIA) 60 MG/ML SOSY injection Inject 60 mg into the skin every 6 (six) months.     dexlansoprazole (DEXILANT) 60 MG capsule Take 1 capsule (60 mg total) by mouth in the morning. 90 capsule 1   diclofenac Sodium (VOLTAREN) 1 % GEL Apply 1 Application topically as needed (pain).     empagliflozin (JARDIANCE) 10 MG TABS tablet Take 1 tablet (10 mg total) by mouth daily. 90 tablet 1   EPINEPHRINE 0.3 mg/0.3 mL IJ SOAJ injection USE AS DIRECTED BY YOUR PHYSICIAN INTRAMUSCULARLY AS NEEDED FOR ANAPHYLAXIS 2 each 1   famotidine (PEPCID) 20 MG tablet Take 1 tablet (20 mg total) by mouth daily before lunch. TAKE 1 TABLET BY MOUTH EACH NIGHT AT BEDTIME Strength: 20 mg 90 tablet 1   Fluticasone-Umeclidin-Vilant (TRELEGY ELLIPTA) 200-62.5-25 MCG/ACT AEPB INHALE 1 INHALATION INTO THE LUNGS IN THE MORNING. 120 each 1   folic acid (FOLVITE) 1 MG tablet Take 1 tablet (1 mg total) by mouth daily. 90 tablet 3   furosemide (LASIX) 40 MG tablet Take 40 mg by mouth 2 (two) times a week. Monday and Thursday     gabapentin (NEURONTIN) 300 MG capsule Take 1 capsule (300 mg total) by mouth at bedtime.     guaiFENesin (MUCINEX) 600 MG 12 hr tablet Take 600 mg by mouth 2 (two) times daily.     HIZENTRA 10 GM/50ML SOLN  Inject 15 g into the skin once a week. 13 g Infusion 300 mL 11   Immune Globulin, Human, (HIZENTRA) 1 GM/5ML SOLN Inject 1 g into the skin.     ipratropium-albuterol (DUONEB) 0.5-2.5 (3) MG/3ML SOLN Take 3 mLs by nebulization every 6 (six) hours as needed. 360 mL 6   isosorbide-hydrALAZINE (BIDIL) 20-37.5 MG tablet Take 1 tablet by mouth 2 (two) times daily. 180 tablet 0   Lactase (LACTOSE FAST ACTING RELIEF PO) Take 2-3 tablets by mouth 3 (three) times daily before meals. For dairy food or drink     magnesium oxide (MAG-OX) 400 (240 Mg) MG tablet Take 400 mg by mouth at bedtime.     montelukast (SINGULAIR) 10 MG tablet Take 1 tablet (10 mg total) by mouth daily after supper. 90 tablet 1   mupirocin ointment (BACTROBAN) 2 % Apply 1 Application topically as needed (wounds). 30 g 3   nystatin (MYCOSTATIN) 100000 UNIT/ML suspension 5 mls swish, gargle, swallow after Trelegy use 473 mL 3   potassium chloride (KLOR-CON) 10 MEQ tablet Take 10 mEq by mouth daily.     predniSONE (DELTASONE) 1 MG tablet Take 1 tablet (1 mg total) by mouth daily with breakfast. Take along with 5 mg daily. 180 tablet 0   predniSONE (DELTASONE) 5 MG tablet Take 1 tablet (5 mg total) by mouth daily with breakfast. 90 tablet 0   Prenatal Vit-Fe Fumarate-FA (PRENATAL  VITAMINS PO) Take 1 tablet by mouth in the morning.     Probiotic Product (ALIGN) 4 MG CAPS Take 4 mg by mouth every evening.     Respiratory Therapy Supplies (NEBULIZER MASK ADULT) MISC 1 Device by Does not apply route as directed. 1 each 1   rivaroxaban (XARELTO) 20 MG TABS tablet Take 1 tablet (20 mg total) by mouth daily with supper. 90 tablet 2   sacubitril-valsartan (ENTRESTO) 24-26 MG Take 1 tablet by mouth 2 (two) times daily. 180 tablet 0   traMADol (ULTRAM) 50 MG tablet Take 1 tablet (50 mg total) by mouth at bedtime. 30 tablet 0   traZODone (DESYREL) 50 MG tablet Take 2 tablets (100 mg total) by mouth at bedtime. (Patient taking differently: Take 50 mg by  mouth at bedtime.) 60 tablet 1   Current Facility-Administered Medications on File Prior to Visit  Medication Dose Route Frequency Provider Last Rate Last Admin   iohexol (OMNIPAQUE) 350 MG/ML injection    PRN Marykay Lex, MD   105 mL at 01/11/21 0955   ipratropium-albuterol (DUONEB) 0.5-2.5 (3) MG/3ML nebulizer solution 3 mL  3 mL Nebulization Q6H PRN Martina Sinner, MD       Allergies  Allergen Reactions   Baclofen Anaphylaxis and Other (See Comments)    Altered mental status requiring a 3-day hospital stay- patient became unresponsive   "Put me in a coma for five days", Altered mental status requiring a 3-day hospital stay- patient became unresponsive   Other Shortness Of Breath and Other (See Comments)    Grass and weeds "sneezing; filled sinuses" (02/20/2012)   Pork-Derived Products Anaphylaxis   Shellfish Allergy Anaphylaxis, Shortness Of Breath, Itching and Rash   Shrimp Extract Anaphylaxis and Other (See Comments)    ALL SHELLFISH   Tetracycline Hcl Nausea And Vomiting   Xolair [Omalizumab] Other (See Comments)    Caused Blood clot   Zoledronic Acid Other (See Comments)    Reclast- Fever, Put in hospital, dr said it was a reaction from a reaction    Fever, inability to walk, Put in hospital  Fever, Put in hospital, dr said it was a reaction from a reaction , Other reaction(s): Unknown   Celecoxib Swelling    Feet swelling   Dilaudid [Hydromorphone Hcl] Itching   Hydrocodone-Acetaminophen Itching   Levofloxacin Other (See Comments)    GI upset   Molds & Smuts Cough   Morphine And Codeine Hives and Itching   Oxycodone Hcl Nausea And Vomiting    GI Intolerance   Paroxetine Nausea And Vomiting and Other (See Comments)    GI Intolerance also   Penicillins Rash and Other (See Comments)    "welts" (02/20/2012)  Tolerated Ancef 02/16/20   Tetracycline Hcl Other (See Comments)    GI Intolerance   Oxycodone-Acetaminophen Itching   Diltiazem Hcl Swelling   Dust  Mite Extract Other (See Comments) and Cough    "sneezing" (02/20/2012) too   Gamunex [Immune Globulin] Itching and Rash    Tolerates hizentra    Penicillin G Procaine Rash and Other (See Comments)    Welts, also   Rituximab Other (See Comments)    Anemia    Rofecoxib Swelling    Vioxx- feet swelling   Tree Extract Itching    "tested and told I was allergic to it; never experienced a reaction to it" (02/20/2012)   Social History   Socioeconomic History   Marital status: Divorced    Spouse name: Not on file  Number of children: 2   Years of education: college   Highest education level: Bachelor's degree (e.g., BA, AB, BS)  Occupational History   Occupation: Disabled    Comment: Retired Dispensing optician: RETIRED  Tobacco Use   Smoking status: Never    Passive exposure: Past   Smokeless tobacco: Never   Tobacco comments:    Parents  Vaping Use   Vaping status: Never Used  Substance and Sexual Activity   Alcohol use: Not Currently    Alcohol/week: 0.0 standard drinks of alcohol   Drug use: No   Sexual activity: Not Currently    Birth control/protection: Surgical    Comment: Hysterectomy  Other Topics Concern   Not on file  Social History Narrative   Patient lives at home alone. Patient  divorced.    Patient has her BS degree.   Right handed.   Caffeine- sometimes coffee.      [facility]:   Born in [place], [place]. She worked as a Armed forces operational officer. She has no pets currently. She does have indoor plants. Previously had mold in her home that was remediated. Carpet was removed.          Social Drivers of Corporate investment banker Strain: Low Risk  (03/26/2023)   Overall Financial Resource Strain (CARDIA)    Difficulty of Paying Living Expenses: Not very hard  Food Insecurity: No Food Insecurity (04/04/2023)   Hunger Vital Sign    Worried About Running Out of Food in the Last Year: Never true    Ran Out of Food in the Last Year: Never true   Transportation Needs: No Transportation Needs (04/04/2023)   PRAPARE - Administrator, Civil Service (Medical): No    Lack of Transportation (Non-Medical): No  Physical Activity: Inactive (03/26/2023)   Exercise Vital Sign    Days of Exercise per Week: 0 days    Minutes of Exercise per Session: 30 min  Stress: Stress Concern Present (03/26/2023)   [employer] of Occupational Health - Occupational Stress Questionnaire    Feeling of Stress : To some extent  Social Connections: Socially Isolated (03/29/2023)   Social Connection and Isolation Panel [NHANES]    Frequency of Communication with Friends and Family: Never    Frequency of Social Gatherings with Friends and Family: Never    Attends Religious Services: Never    Database administrator or Organizations: No    Attends Banker Meetings: Never    Marital Status: Divorced  Catering manager Violence: Not At Risk (04/04/2023)   Humiliation, Afraid, Rape, and Kick questionnaire    Fear of Current or Ex-Partner: No    Emotionally Abused: No    Physically Abused: No    Sexually Abused: No     Review of Systems  Psychiatric/Behavioral:  The patient has insomnia.        Objective:   Physical Exam Vitals reviewed.  Constitutional:      General: She is not in acute distress.    Appearance: Normal appearance. She is not ill-appearing or toxic-appearing.  HENT:     Nose: No congestion or rhinorrhea.  Cardiovascular:     Rate and Rhythm: Normal rate and regular rhythm.     Heart sounds: Normal heart sounds. No murmur heard. Pulmonary:     Effort: Pulmonary effort is normal. No respiratory distress.     Breath sounds: No stridor or decreased air movement. No wheezing, rhonchi or rales.  Abdominal:     General: Bowel sounds are normal. There is no distension.     Palpations: Abdomen is soft.     Tenderness: There is no abdominal tenderness. There is no guarding.  Musculoskeletal:     Right lower leg: No  edema.     Left lower leg: No edema.  Skin:    Findings: No erythema.  Neurological:     Mental Status: She is alert and oriented to person, place, and time.     Cranial Nerves: No cranial nerve deficit.     Sensory: No sensory deficit.     Motor: No weakness.     Coordination: Coordination normal.        Assessment & Plan:  Anemia, unspecified type - Plan: CBC with Differential/Platelet, COMPLETE METABOLIC PANEL WITH GFR  Hypomagnesemia - Plan: Magnesium  Pneumonia of both lungs due to infectious organism, unspecified part of lung  Chronic systolic CHF (congestive heart failure) (HCC) I will recheck her magnesium.  My current concern is her hemoglobin.  If her hemoglobin continues to drop we will need to hold Xarelto as well as Plavix.  The patient had a stent placed in her heart in 2022.  Therefore she is more than 12 months past her most recent stent.  She also had a blood clot, 1 episode, several years ago.  Therefore I believe it would be safe to temporarily hold the Xarelto if necessary.  I do not appreciate any evidence of fluid overload today on exam.  Therefore I believe her current dose of Lasix is sufficient taking it on Monday and Wednesday.  I will check her potassium and her renal function.  She is currently taking potassium twice a week with the Lasix

## 2023-05-03 ENCOUNTER — Ambulatory Visit: Payer: Self-pay | Admitting: Family Medicine

## 2023-05-03 ENCOUNTER — Telehealth: Payer: Self-pay | Admitting: Allergy and Immunology

## 2023-05-03 LAB — COMPLETE METABOLIC PANEL WITH GFR
AG Ratio: 1.5 (calc) (ref 1.0–2.5)
ALT: 50 U/L — ABNORMAL HIGH (ref 6–29)
AST: 39 U/L — ABNORMAL HIGH (ref 10–35)
Albumin: 4.1 g/dL (ref 3.6–5.1)
Alkaline phosphatase (APISO): 46 U/L (ref 37–153)
BUN/Creatinine Ratio: 22 (calc) (ref 6–22)
BUN: 29 mg/dL — ABNORMAL HIGH (ref 7–25)
CO2: 24 mmol/L (ref 20–32)
Calcium: 9.6 mg/dL (ref 8.6–10.4)
Chloride: 107 mmol/L (ref 98–110)
Creat: 1.3 mg/dL — ABNORMAL HIGH (ref 0.60–1.00)
Globulin: 2.7 g/dL (ref 1.9–3.7)
Glucose, Bld: 148 mg/dL — ABNORMAL HIGH (ref 65–99)
Potassium: 4.8 mmol/L (ref 3.5–5.3)
Sodium: 140 mmol/L (ref 135–146)
Total Bilirubin: 0.6 mg/dL (ref 0.2–1.2)
Total Protein: 6.8 g/dL (ref 6.1–8.1)
eGFR: 43 mL/min/{1.73_m2} — ABNORMAL LOW (ref >=60)

## 2023-05-03 LAB — CBC WITH DIFFERENTIAL/PLATELET
Absolute Lymphocytes: 912 {cells}/uL (ref 850–3900)
Absolute Monocytes: 741 {cells}/uL (ref 200–950)
Basophils Absolute: 29 {cells}/uL (ref 0–200)
Basophils Relative: 0.3 %
Eosinophils Absolute: 19 {cells}/uL (ref 15–500)
Eosinophils Relative: 0.2 %
HCT: 30.9 % — ABNORMAL LOW (ref 35.0–45.0)
Hemoglobin: 10.3 g/dL — ABNORMAL LOW (ref 11.7–15.5)
MCH: 33.9 pg — ABNORMAL HIGH (ref 27.0–33.0)
MCHC: 33.3 g/dL (ref 32.0–36.0)
MCV: 101.6 fL — ABNORMAL HIGH (ref 80.0–100.0)
MPV: 12 fL (ref 7.5–12.5)
Monocytes Relative: 7.8 %
Neutro Abs: 7800 {cells}/uL (ref 1500–7800)
Neutrophils Relative %: 82.1 %
Platelets: 171 10*3/uL (ref 140–400)
RBC: 3.04 10*6/uL — ABNORMAL LOW (ref 3.80–5.10)
RDW: 15.5 % — ABNORMAL HIGH (ref 11.0–15.0)
Total Lymphocyte: 9.6 %
WBC: 9.5 10*3/uL (ref 3.8–10.8)

## 2023-05-03 LAB — MAGNESIUM: Magnesium: 2 mg/dL (ref 1.5–2.5)

## 2023-05-03 NOTE — Telephone Encounter (Signed)
 Copied from CRM [id number]. Topic: Clinical - Medication Question >> May 03, 2023 10:23 AM Clayton Bibles wrote: Reason for CRM:  Ebony Scott is calling to get an order for a portable oxygen tank to be sent to [facility] at fax number [phone]. Please put on order to deliver to Ebony Scott's home.  If you have questions, please call Ebony Scott at [phone].

## 2023-05-03 NOTE — Telephone Encounter (Signed)
 Please see note below: pt is requesting portable O2 orders. Routing to clinic for review/follow up.

## 2023-05-03 NOTE — Telephone Encounter (Signed)
 [facility] pharmacy requesting form that was sent in for Hizentra to be filled out and sent back - this is the second attempt

## 2023-05-06 ENCOUNTER — Telehealth: Payer: Self-pay

## 2023-05-06 NOTE — Telephone Encounter (Signed)
 Copied from CRM [id number]. Topic: General - Call Back - No Documentation >> May 03, 2023  4:31 PM Ebony Scott wrote: Reason for CRM: patient is calling saying that [facility] says that they need to come out to her home in order for her to get the portal oxygen please call patient [phone]

## 2023-05-07 ENCOUNTER — Other Ambulatory Visit: Payer: Self-pay | Admitting: Cardiology

## 2023-05-07 ENCOUNTER — Other Ambulatory Visit: Payer: Self-pay | Admitting: Internal Medicine

## 2023-05-07 ENCOUNTER — Other Ambulatory Visit: Payer: Self-pay | Admitting: Allergy and Immunology

## 2023-05-07 DIAGNOSIS — J189 Pneumonia, unspecified organism: Secondary | ICD-10-CM | POA: Diagnosis not present

## 2023-05-07 DIAGNOSIS — A419 Sepsis, unspecified organism: Secondary | ICD-10-CM | POA: Diagnosis not present

## 2023-05-07 DIAGNOSIS — E1122 Type 2 diabetes mellitus with diabetic chronic kidney disease: Secondary | ICD-10-CM | POA: Diagnosis not present

## 2023-05-07 DIAGNOSIS — J4550 Severe persistent asthma, uncomplicated: Secondary | ICD-10-CM | POA: Diagnosis not present

## 2023-05-07 DIAGNOSIS — J9611 Chronic respiratory failure with hypoxia: Secondary | ICD-10-CM | POA: Diagnosis not present

## 2023-05-07 DIAGNOSIS — I5020 Unspecified systolic (congestive) heart failure: Secondary | ICD-10-CM

## 2023-05-07 DIAGNOSIS — I5022 Chronic systolic (congestive) heart failure: Secondary | ICD-10-CM | POA: Diagnosis not present

## 2023-05-07 DIAGNOSIS — R6520 Severe sepsis without septic shock: Secondary | ICD-10-CM | POA: Diagnosis not present

## 2023-05-07 DIAGNOSIS — J470 Bronchiectasis with acute lower respiratory infection: Secondary | ICD-10-CM | POA: Diagnosis not present

## 2023-05-07 DIAGNOSIS — I130 Hypertensive heart and chronic kidney disease with heart failure and stage 1 through stage 4 chronic kidney disease, or unspecified chronic kidney disease: Secondary | ICD-10-CM | POA: Diagnosis not present

## 2023-05-07 NOTE — Telephone Encounter (Signed)
 Last Fill: 04/16/2023  Next Visit: 05/13/2023  Last Visit: 12/11/2022  Dx: Rheumatoid arthritis with negative rheumatoid factor, involving unspecified site   Current Dose per office note on 12/11/2022: prednisone 7 mg daily   Okay to refill Prednisone?

## 2023-05-10 ENCOUNTER — Other Ambulatory Visit: Payer: Self-pay | Admitting: Allergy and Immunology

## 2023-05-10 DIAGNOSIS — E119 Type 2 diabetes mellitus without complications: Secondary | ICD-10-CM

## 2023-05-10 DIAGNOSIS — R6520 Severe sepsis without septic shock: Secondary | ICD-10-CM | POA: Diagnosis not present

## 2023-05-10 DIAGNOSIS — J189 Pneumonia, unspecified organism: Secondary | ICD-10-CM | POA: Diagnosis not present

## 2023-05-10 DIAGNOSIS — I5022 Chronic systolic (congestive) heart failure: Secondary | ICD-10-CM | POA: Diagnosis not present

## 2023-05-10 DIAGNOSIS — J9611 Chronic respiratory failure with hypoxia: Secondary | ICD-10-CM | POA: Diagnosis not present

## 2023-05-10 DIAGNOSIS — J4550 Severe persistent asthma, uncomplicated: Secondary | ICD-10-CM | POA: Diagnosis not present

## 2023-05-10 DIAGNOSIS — I130 Hypertensive heart and chronic kidney disease with heart failure and stage 1 through stage 4 chronic kidney disease, or unspecified chronic kidney disease: Secondary | ICD-10-CM | POA: Diagnosis not present

## 2023-05-10 DIAGNOSIS — A419 Sepsis, unspecified organism: Secondary | ICD-10-CM | POA: Diagnosis not present

## 2023-05-10 DIAGNOSIS — J470 Bronchiectasis with acute lower respiratory infection: Secondary | ICD-10-CM | POA: Diagnosis not present

## 2023-05-10 DIAGNOSIS — E1122 Type 2 diabetes mellitus with diabetic chronic kidney disease: Secondary | ICD-10-CM | POA: Diagnosis not present

## 2023-05-13 ENCOUNTER — Ambulatory Visit: Payer: Medicare PPO | Attending: Internal Medicine | Admitting: Internal Medicine

## 2023-05-13 ENCOUNTER — Encounter: Payer: Self-pay | Admitting: Internal Medicine

## 2023-05-13 ENCOUNTER — Telehealth: Payer: Self-pay | Admitting: Allergy and Immunology

## 2023-05-13 VITALS — BP 105/55 | HR 72 | Resp 14 | Ht 60.0 in | Wt 130.0 lb

## 2023-05-13 DIAGNOSIS — M25512 Pain in left shoulder: Secondary | ICD-10-CM

## 2023-05-13 DIAGNOSIS — M0600 Rheumatoid arthritis without rheumatoid factor, unspecified site: Secondary | ICD-10-CM

## 2023-05-13 DIAGNOSIS — R21 Rash and other nonspecific skin eruption: Secondary | ICD-10-CM | POA: Diagnosis not present

## 2023-05-13 DIAGNOSIS — R197 Diarrhea, unspecified: Secondary | ICD-10-CM | POA: Diagnosis not present

## 2023-05-13 DIAGNOSIS — Z7952 Long term (current) use of systemic steroids: Secondary | ICD-10-CM | POA: Diagnosis not present

## 2023-05-13 DIAGNOSIS — M797 Fibromyalgia: Secondary | ICD-10-CM

## 2023-05-13 MED ORDER — PREDNISONE 5 MG PO TABS: 5.0000 mg | ORAL_TABLET | Freq: Every day | ORAL | 0 refills | Status: DC

## 2023-05-13 NOTE — Telephone Encounter (Signed)
 Patient called stating she is needing a refill on Nystatin and it needs to be 10 Mls or larger. Patient's pharmacy is [facility] on [address].

## 2023-05-13 NOTE — Patient Instructions (Signed)
 I recommend decreasing the prednisone donw to 6 mg daily now (5mg  tablet + 1mg  tablet) for 1-2 weeks then down to 5 mg daily and maintain that dose for now.

## 2023-05-14 ENCOUNTER — Telehealth: Payer: Self-pay | Admitting: Allergy and Immunology

## 2023-05-14 DIAGNOSIS — R6520 Severe sepsis without septic shock: Secondary | ICD-10-CM | POA: Diagnosis not present

## 2023-05-14 DIAGNOSIS — E1122 Type 2 diabetes mellitus with diabetic chronic kidney disease: Secondary | ICD-10-CM | POA: Diagnosis not present

## 2023-05-14 DIAGNOSIS — J189 Pneumonia, unspecified organism: Secondary | ICD-10-CM | POA: Diagnosis not present

## 2023-05-14 DIAGNOSIS — J4550 Severe persistent asthma, uncomplicated: Secondary | ICD-10-CM | POA: Diagnosis not present

## 2023-05-14 DIAGNOSIS — J470 Bronchiectasis with acute lower respiratory infection: Secondary | ICD-10-CM | POA: Diagnosis not present

## 2023-05-14 DIAGNOSIS — I130 Hypertensive heart and chronic kidney disease with heart failure and stage 1 through stage 4 chronic kidney disease, or unspecified chronic kidney disease: Secondary | ICD-10-CM | POA: Diagnosis not present

## 2023-05-14 DIAGNOSIS — J9611 Chronic respiratory failure with hypoxia: Secondary | ICD-10-CM | POA: Diagnosis not present

## 2023-05-14 DIAGNOSIS — I5022 Chronic systolic (congestive) heart failure: Secondary | ICD-10-CM | POA: Diagnosis not present

## 2023-05-14 DIAGNOSIS — A419 Sepsis, unspecified organism: Secondary | ICD-10-CM | POA: Diagnosis not present

## 2023-05-14 MED ORDER — NYSTATIN 100000 UNIT/ML MT SUSP: OROMUCOSAL | 3 refills | Status: DC

## 2023-05-14 NOTE — Telephone Encounter (Signed)
Refill has been sent to [facility].  

## 2023-05-14 NOTE — Telephone Encounter (Signed)
 Patient called and stated she needs a refill on medication nystatin nystatin (MYCOSTATIN) 100000 UNIT/ML suspension, and needs the largest bottle that they have.

## 2023-05-14 NOTE — Telephone Encounter (Signed)
 This seems to be a duplicate and has already been handled in another encounter.

## 2023-05-15 DIAGNOSIS — G4733 Obstructive sleep apnea (adult) (pediatric): Secondary | ICD-10-CM | POA: Diagnosis not present

## 2023-05-15 DIAGNOSIS — J4540 Moderate persistent asthma, uncomplicated: Secondary | ICD-10-CM | POA: Diagnosis not present

## 2023-05-15 DIAGNOSIS — R0602 Shortness of breath: Secondary | ICD-10-CM | POA: Diagnosis not present

## 2023-05-15 DIAGNOSIS — G4736 Sleep related hypoventilation in conditions classified elsewhere: Secondary | ICD-10-CM | POA: Diagnosis not present

## 2023-05-15 DIAGNOSIS — J45909 Unspecified asthma, uncomplicated: Secondary | ICD-10-CM | POA: Diagnosis not present

## 2023-05-15 DIAGNOSIS — M0600 Rheumatoid arthritis without rheumatoid factor, unspecified site: Secondary | ICD-10-CM | POA: Diagnosis not present

## 2023-05-15 DIAGNOSIS — J471 Bronchiectasis with (acute) exacerbation: Secondary | ICD-10-CM | POA: Diagnosis not present

## 2023-05-15 DIAGNOSIS — G894 Chronic pain syndrome: Secondary | ICD-10-CM | POA: Diagnosis not present

## 2023-05-15 DIAGNOSIS — I5032 Chronic diastolic (congestive) heart failure: Secondary | ICD-10-CM | POA: Diagnosis not present

## 2023-05-16 DIAGNOSIS — R6520 Severe sepsis without septic shock: Secondary | ICD-10-CM | POA: Diagnosis not present

## 2023-05-16 DIAGNOSIS — J4550 Severe persistent asthma, uncomplicated: Secondary | ICD-10-CM | POA: Diagnosis not present

## 2023-05-16 DIAGNOSIS — J189 Pneumonia, unspecified organism: Secondary | ICD-10-CM | POA: Diagnosis not present

## 2023-05-16 DIAGNOSIS — J9611 Chronic respiratory failure with hypoxia: Secondary | ICD-10-CM | POA: Diagnosis not present

## 2023-05-16 DIAGNOSIS — E1122 Type 2 diabetes mellitus with diabetic chronic kidney disease: Secondary | ICD-10-CM | POA: Diagnosis not present

## 2023-05-16 DIAGNOSIS — A419 Sepsis, unspecified organism: Secondary | ICD-10-CM | POA: Diagnosis not present

## 2023-05-16 DIAGNOSIS — I130 Hypertensive heart and chronic kidney disease with heart failure and stage 1 through stage 4 chronic kidney disease, or unspecified chronic kidney disease: Secondary | ICD-10-CM | POA: Diagnosis not present

## 2023-05-16 DIAGNOSIS — I5022 Chronic systolic (congestive) heart failure: Secondary | ICD-10-CM | POA: Diagnosis not present

## 2023-05-16 DIAGNOSIS — J470 Bronchiectasis with acute lower respiratory infection: Secondary | ICD-10-CM | POA: Diagnosis not present

## 2023-05-18 ENCOUNTER — Encounter (HOSPITAL_COMMUNITY): Payer: Self-pay | Admitting: Emergency Medicine

## 2023-05-18 ENCOUNTER — Other Ambulatory Visit: Payer: Self-pay

## 2023-05-18 ENCOUNTER — Inpatient Hospital Stay (HOSPITAL_COMMUNITY)
Admission: EM | Admit: 2023-05-18 | Discharge: 2023-05-24 | DRG: 871 | Disposition: A | Discharge status: Home Health | Attending: Family Medicine | Admitting: Family Medicine

## 2023-05-18 ENCOUNTER — Emergency Department (HOSPITAL_COMMUNITY)

## 2023-05-18 DIAGNOSIS — I959 Hypotension, unspecified: Secondary | ICD-10-CM | POA: Diagnosis not present

## 2023-05-18 DIAGNOSIS — J9809 Other diseases of bronchus, not elsewhere classified: Secondary | ICD-10-CM | POA: Diagnosis present

## 2023-05-18 DIAGNOSIS — Z7984 Long term (current) use of oral hypoglycemic drugs: Secondary | ICD-10-CM

## 2023-05-18 DIAGNOSIS — N1832 Chronic kidney disease, stage 3b: Secondary | ICD-10-CM | POA: Diagnosis not present

## 2023-05-18 DIAGNOSIS — Z7983 Long term (current) use of bisphosphonates: Secondary | ICD-10-CM

## 2023-05-18 DIAGNOSIS — E2740 Unspecified adrenocortical insufficiency: Secondary | ICD-10-CM | POA: Diagnosis present

## 2023-05-18 DIAGNOSIS — Z881 Allergy status to other antibiotic agents status: Secondary | ICD-10-CM

## 2023-05-18 DIAGNOSIS — Z86711 Personal history of pulmonary embolism: Secondary | ICD-10-CM | POA: Diagnosis present

## 2023-05-18 DIAGNOSIS — J9601 Acute respiratory failure with hypoxia: Secondary | ICD-10-CM | POA: Diagnosis not present

## 2023-05-18 DIAGNOSIS — Z885 Allergy status to narcotic agent status: Secondary | ICD-10-CM

## 2023-05-18 DIAGNOSIS — J479 Bronchiectasis, uncomplicated: Secondary | ICD-10-CM | POA: Diagnosis present

## 2023-05-18 DIAGNOSIS — R918 Other nonspecific abnormal finding of lung field: Secondary | ICD-10-CM | POA: Diagnosis not present

## 2023-05-18 DIAGNOSIS — J189 Pneumonia, unspecified organism: Secondary | ICD-10-CM | POA: Diagnosis present

## 2023-05-18 DIAGNOSIS — I2510 Atherosclerotic heart disease of native coronary artery without angina pectoris: Secondary | ICD-10-CM | POA: Diagnosis present

## 2023-05-18 DIAGNOSIS — Z888 Allergy status to other drugs, medicaments and biological substances status: Secondary | ICD-10-CM

## 2023-05-18 DIAGNOSIS — E7800 Pure hypercholesterolemia, unspecified: Secondary | ICD-10-CM | POA: Diagnosis present

## 2023-05-18 DIAGNOSIS — A419 Sepsis, unspecified organism: Principal | ICD-10-CM | POA: Diagnosis present

## 2023-05-18 DIAGNOSIS — Z8261 Family history of arthritis: Secondary | ICD-10-CM

## 2023-05-18 DIAGNOSIS — J398 Other specified diseases of upper respiratory tract: Secondary | ICD-10-CM | POA: Diagnosis present

## 2023-05-18 DIAGNOSIS — Z7902 Long term (current) use of antithrombotics/antiplatelets: Secondary | ICD-10-CM | POA: Diagnosis not present

## 2023-05-18 DIAGNOSIS — Z7952 Long term (current) use of systemic steroids: Secondary | ICD-10-CM | POA: Diagnosis not present

## 2023-05-18 DIAGNOSIS — Z91013 Allergy to seafood: Secondary | ICD-10-CM

## 2023-05-18 DIAGNOSIS — I5022 Chronic systolic (congestive) heart failure: Secondary | ICD-10-CM | POA: Diagnosis present

## 2023-05-18 DIAGNOSIS — Z96611 Presence of right artificial shoulder joint: Secondary | ICD-10-CM | POA: Diagnosis present

## 2023-05-18 DIAGNOSIS — Z91014 Allergy to mammalian meats: Secondary | ICD-10-CM

## 2023-05-18 DIAGNOSIS — D801 Nonfamilial hypogammaglobulinemia: Secondary | ICD-10-CM | POA: Diagnosis not present

## 2023-05-18 DIAGNOSIS — Z66 Do not resuscitate: Secondary | ICD-10-CM | POA: Diagnosis present

## 2023-05-18 DIAGNOSIS — J4540 Moderate persistent asthma, uncomplicated: Secondary | ICD-10-CM | POA: Diagnosis present

## 2023-05-18 DIAGNOSIS — G4734 Idiopathic sleep related nonobstructive alveolar hypoventilation: Secondary | ICD-10-CM

## 2023-05-18 DIAGNOSIS — G894 Chronic pain syndrome: Secondary | ICD-10-CM | POA: Diagnosis present

## 2023-05-18 DIAGNOSIS — Z7951 Long term (current) use of inhaled steroids: Secondary | ICD-10-CM

## 2023-05-18 DIAGNOSIS — J159 Unspecified bacterial pneumonia: Secondary | ICD-10-CM | POA: Diagnosis present

## 2023-05-18 DIAGNOSIS — Z801 Family history of malignant neoplasm of trachea, bronchus and lung: Secondary | ICD-10-CM

## 2023-05-18 DIAGNOSIS — Z7901 Long term (current) use of anticoagulants: Secondary | ICD-10-CM | POA: Diagnosis not present

## 2023-05-18 DIAGNOSIS — E8721 Acute metabolic acidosis: Secondary | ICD-10-CM

## 2023-05-18 DIAGNOSIS — E1122 Type 2 diabetes mellitus with diabetic chronic kidney disease: Secondary | ICD-10-CM | POA: Diagnosis present

## 2023-05-18 DIAGNOSIS — Z9981 Dependence on supplemental oxygen: Secondary | ICD-10-CM

## 2023-05-18 DIAGNOSIS — Z8249 Family history of ischemic heart disease and other diseases of the circulatory system: Secondary | ICD-10-CM

## 2023-05-18 DIAGNOSIS — Z800 Family history of malignant neoplasm of digestive organs: Secondary | ICD-10-CM

## 2023-05-18 DIAGNOSIS — Z880 Allergy status to penicillin: Secondary | ICD-10-CM

## 2023-05-18 DIAGNOSIS — K219 Gastro-esophageal reflux disease without esophagitis: Secondary | ICD-10-CM | POA: Diagnosis present

## 2023-05-18 DIAGNOSIS — R0689 Other abnormalities of breathing: Secondary | ICD-10-CM | POA: Diagnosis not present

## 2023-05-18 DIAGNOSIS — Z833 Family history of diabetes mellitus: Secondary | ICD-10-CM

## 2023-05-18 DIAGNOSIS — Z955 Presence of coronary angioplasty implant and graft: Secondary | ICD-10-CM

## 2023-05-18 DIAGNOSIS — M059 Rheumatoid arthritis with rheumatoid factor, unspecified: Secondary | ICD-10-CM | POA: Diagnosis present

## 2023-05-18 DIAGNOSIS — J9621 Acute and chronic respiratory failure with hypoxia: Secondary | ICD-10-CM | POA: Diagnosis present

## 2023-05-18 DIAGNOSIS — M069 Rheumatoid arthritis, unspecified: Secondary | ICD-10-CM | POA: Diagnosis present

## 2023-05-18 DIAGNOSIS — E039 Hypothyroidism, unspecified: Secondary | ICD-10-CM | POA: Diagnosis present

## 2023-05-18 DIAGNOSIS — M810 Age-related osteoporosis without current pathological fracture: Secondary | ICD-10-CM | POA: Diagnosis present

## 2023-05-18 DIAGNOSIS — Z471 Aftercare following joint replacement surgery: Secondary | ICD-10-CM | POA: Diagnosis not present

## 2023-05-18 DIAGNOSIS — R059 Cough, unspecified: Secondary | ICD-10-CM | POA: Diagnosis not present

## 2023-05-18 DIAGNOSIS — Z86718 Personal history of other venous thrombosis and embolism: Secondary | ICD-10-CM

## 2023-05-18 DIAGNOSIS — R509 Fever, unspecified: Secondary | ICD-10-CM | POA: Diagnosis not present

## 2023-05-18 DIAGNOSIS — Z79620 Long term (current) use of immunosuppressive biologic: Secondary | ICD-10-CM

## 2023-05-18 DIAGNOSIS — Z853 Personal history of malignant neoplasm of breast: Secondary | ICD-10-CM

## 2023-05-18 DIAGNOSIS — R0902 Hypoxemia: Secondary | ICD-10-CM | POA: Diagnosis not present

## 2023-05-18 DIAGNOSIS — Z1152 Encounter for screening for COVID-19: Secondary | ICD-10-CM

## 2023-05-18 DIAGNOSIS — Z860101 Personal history of adenomatous and serrated colon polyps: Secondary | ICD-10-CM

## 2023-05-18 DIAGNOSIS — J984 Other disorders of lung: Secondary | ICD-10-CM | POA: Diagnosis not present

## 2023-05-18 DIAGNOSIS — E119 Type 2 diabetes mellitus without complications: Secondary | ICD-10-CM

## 2023-05-18 DIAGNOSIS — R062 Wheezing: Secondary | ICD-10-CM | POA: Diagnosis not present

## 2023-05-18 DIAGNOSIS — Z823 Family history of stroke: Secondary | ICD-10-CM

## 2023-05-18 DIAGNOSIS — B3731 Acute candidiasis of vulva and vagina: Secondary | ICD-10-CM | POA: Diagnosis present

## 2023-05-18 DIAGNOSIS — Z79899 Other long term (current) drug therapy: Secondary | ICD-10-CM

## 2023-05-18 DIAGNOSIS — G4733 Obstructive sleep apnea (adult) (pediatric): Secondary | ICD-10-CM | POA: Diagnosis present

## 2023-05-18 DIAGNOSIS — Z981 Arthrodesis status: Secondary | ICD-10-CM

## 2023-05-18 DIAGNOSIS — M797 Fibromyalgia: Secondary | ICD-10-CM | POA: Diagnosis present

## 2023-05-18 DIAGNOSIS — Z96651 Presence of right artificial knee joint: Secondary | ICD-10-CM | POA: Diagnosis present

## 2023-05-18 DIAGNOSIS — Z8701 Personal history of pneumonia (recurrent): Secondary | ICD-10-CM

## 2023-05-18 DIAGNOSIS — Z91048 Other nonmedicinal substance allergy status: Secondary | ICD-10-CM

## 2023-05-18 HISTORY — DX: Displaced fracture of fifth metatarsal bone, left foot, subsequent encounter for fracture with routine healing: S92.352D

## 2023-05-18 LAB — COMPREHENSIVE METABOLIC PANEL WITH GFR
ALT: 55 U/L — ABNORMAL HIGH (ref 0–44)
AST: 40 U/L (ref 15–41)
Albumin: 2.8 g/dL — ABNORMAL LOW (ref 3.5–5.0)
Alkaline Phosphatase: 47 U/L (ref 38–126)
Anion gap: 6 (ref 5–15)
BUN: 42 mg/dL — ABNORMAL HIGH (ref 8–23)
CO2: 23 mmol/L (ref 22–32)
Calcium: 8.3 mg/dL — ABNORMAL LOW (ref 8.9–10.3)
Chloride: 110 mmol/L (ref 98–111)
Creatinine, Ser: 1.55 mg/dL — ABNORMAL HIGH (ref 0.44–1.00)
GFR, Estimated: 35 mL/min — ABNORMAL LOW (ref >60)
Glucose, Bld: 98 mg/dL (ref 70–99)
Potassium: 3.7 mmol/L (ref 3.5–5.1)
Sodium: 139 mmol/L (ref 135–145)
Total Bilirubin: 1 mg/dL (ref 0.0–1.2)
Total Protein: 6.6 g/dL (ref 6.5–8.1)

## 2023-05-18 LAB — GLUCOSE, CAPILLARY
Glucose-Capillary: 94 mg/dL (ref 70–99)
Glucose-Capillary: 91 mg/dL (ref 70–99)

## 2023-05-18 LAB — CBC WITH DIFFERENTIAL/PLATELET
Abs Immature Granulocytes: 0.2 10*3/uL — ABNORMAL HIGH (ref 0.00–0.07)
Basophils Absolute: 0 10*3/uL (ref 0.0–0.1)
Basophils Relative: 0 %
Eosinophils Absolute: 0.1 10*3/uL (ref 0.0–0.5)
Eosinophils Relative: 1 %
HCT: 33.1 % — ABNORMAL LOW (ref 36.0–46.0)
Hemoglobin: 10.4 g/dL — ABNORMAL LOW (ref 12.0–15.0)
Immature Granulocytes: 1 %
Lymphocytes Relative: 6 %
Lymphs Abs: 1 10*3/uL (ref 0.7–4.0)
MCH: 33.9 pg (ref 26.0–34.0)
MCHC: 31.4 g/dL (ref 30.0–36.0)
MCV: 107.8 fL — ABNORMAL HIGH (ref 80.0–100.0)
Monocytes Absolute: 1.5 10*3/uL — ABNORMAL HIGH (ref 0.1–1.0)
Monocytes Relative: 9 %
Neutro Abs: 13 10*3/uL — ABNORMAL HIGH (ref 1.7–7.7)
Neutrophils Relative %: 83 %
Platelets: 135 10*3/uL — ABNORMAL LOW (ref 150–400)
RBC: 3.07 MIL/uL — ABNORMAL LOW (ref 3.87–5.11)
RDW: 18.9 % — ABNORMAL HIGH (ref 11.5–15.5)
WBC: 15.8 10*3/uL — ABNORMAL HIGH (ref 4.0–10.5)
nRBC: 0 % (ref 0.0–0.2)

## 2023-05-18 LAB — BLOOD GAS, VENOUS
Acid-base deficit: 2 mmol/L (ref 0.0–2.0)
Bicarbonate: 22.3 mmol/L (ref 20.0–28.0)
O2 Saturation: 87.9 %
Patient temperature: 37
pCO2, Ven: 36 mmHg — ABNORMAL LOW (ref 44–60)
pH, Ven: 7.4 (ref 7.25–7.43)
pO2, Ven: 53 mmHg — ABNORMAL HIGH (ref 32–45)

## 2023-05-18 LAB — PROTIME-INR
INR: 1.8 — ABNORMAL HIGH (ref 0.8–1.2)
Prothrombin Time: 21.2 s — ABNORMAL HIGH (ref 11.4–15.2)

## 2023-05-18 LAB — RESP PANEL BY RT-PCR (RSV, FLU A&B, COVID)  RVPGX2
Influenza A by PCR: NEGATIVE
Influenza B by PCR: NEGATIVE
Resp Syncytial Virus by PCR: NEGATIVE
SARS Coronavirus 2 by RT PCR: NEGATIVE

## 2023-05-18 LAB — I-STAT CG4 LACTIC ACID, ED: Lactic Acid, Venous: 1.3 mmol/L (ref 0.5–1.9)

## 2023-05-18 LAB — BRAIN NATRIURETIC PEPTIDE: B Natriuretic Peptide: 302.6 pg/mL — ABNORMAL HIGH (ref 0.0–100.0)

## 2023-05-18 LAB — APTT: aPTT: 37 s — ABNORMAL HIGH (ref 24–36)

## 2023-05-18 MED ORDER — TRAMADOL HCL 50 MG PO TABS: 50.0000 mg | ORAL_TABLET | Freq: Every day | ORAL | Status: DC

## 2023-05-18 MED ORDER — VANCOMYCIN HCL IN DEXTROSE 1-5 GM/200ML-% IV SOLN
1000.0000 mg | Freq: Once | INTRAVENOUS | Status: AC
  Administered 2023-05-18: 1000 mg via INTRAVENOUS
  Filled 2023-05-18: qty 200

## 2023-05-18 MED ORDER — ACETAMINOPHEN 650 MG RE SUPP
650.0000 mg | Freq: Four times a day (QID) | RECTAL | Status: DC | PRN
  Administered 2023-05-19: 650 mg via RECTAL
  Filled 2023-05-18: qty 1

## 2023-05-18 MED ORDER — GABAPENTIN 300 MG PO CAPS: 300.0000 mg | ORAL_CAPSULE | Freq: Every day | ORAL | Status: DC

## 2023-05-18 MED ORDER — IPRATROPIUM-ALBUTEROL 0.5-2.5 (3) MG/3ML IN SOLN
RESPIRATORY_TRACT | Status: AC
  Filled 2023-05-18: qty 3

## 2023-05-18 MED ORDER — IPRATROPIUM-ALBUTEROL 0.5-2.5 (3) MG/3ML IN SOLN
3.0000 mL | Freq: Four times a day (QID) | RESPIRATORY_TRACT | Status: DC
  Administered 2023-05-18 – 2023-05-20 (×7): 3 mL via RESPIRATORY_TRACT
  Filled 2023-05-18 (×5): qty 3

## 2023-05-18 MED ORDER — MELATONIN 5 MG PO TABS
10.0000 mg | ORAL_TABLET | Freq: Every evening | ORAL | Status: DC | PRN
  Administered 2023-05-21 – 2023-05-23 (×3): 10 mg via ORAL
  Filled 2023-05-18 (×3): qty 2

## 2023-05-18 MED ORDER — AZITHROMYCIN 250 MG PO TABS
500.0000 mg | ORAL_TABLET | Freq: Every day | ORAL | Status: AC
  Administered 2023-05-18 – 2023-05-22 (×5): 500 mg via ORAL
  Filled 2023-05-18 (×5): qty 2

## 2023-05-18 MED ORDER — ONDANSETRON HCL 4 MG PO TABS
4.0000 mg | ORAL_TABLET | Freq: Four times a day (QID) | ORAL | Status: DC | PRN
Start: 2023-05-18 — End: 2023-05-24

## 2023-05-18 MED ORDER — LACTATED RINGERS IV SOLN: INTRAVENOUS | Status: DC

## 2023-05-18 MED ORDER — RIVAROXABAN 20 MG PO TABS
20.0000 mg | ORAL_TABLET | Freq: Every day | ORAL | Status: DC
  Administered 2023-05-19 – 2023-05-23 (×5): 20 mg via ORAL
  Filled 2023-05-18 (×7): qty 1

## 2023-05-18 MED ORDER — SODIUM CHLORIDE 0.9 % IV SOLN
2.0000 g | INTRAVENOUS | Status: DC
  Administered 2023-05-19 – 2023-05-24 (×6): 2 g via INTRAVENOUS
  Filled 2023-05-18 (×6): qty 20

## 2023-05-18 MED ORDER — ATORVASTATIN CALCIUM 40 MG PO TABS
80.0000 mg | ORAL_TABLET | Freq: Every evening | ORAL | Status: DC
  Administered 2023-05-19 – 2023-05-23 (×5): 80 mg via ORAL
  Filled 2023-05-18 (×5): qty 4

## 2023-05-18 MED ORDER — MONTELUKAST SODIUM 10 MG PO TABS
10.0000 mg | ORAL_TABLET | Freq: Every day | ORAL | Status: DC
  Administered 2023-05-19 – 2023-05-23 (×5): 10 mg via ORAL
  Filled 2023-05-18 (×5): qty 1

## 2023-05-18 MED ORDER — ISOSORB DINITRATE-HYDRALAZINE 20-37.5 MG PO TABS
1.0000 | ORAL_TABLET | Freq: Two times a day (BID) | ORAL | Status: DC
  Administered 2023-05-19 – 2023-05-24 (×10): 1 via ORAL
  Filled 2023-05-18 (×12): qty 1

## 2023-05-18 MED ORDER — ONDANSETRON HCL 4 MG/2ML IJ SOLN: 4.0000 mg | Freq: Four times a day (QID) | INTRAMUSCULAR | Status: DC | PRN

## 2023-05-18 MED ORDER — PREDNISONE 5 MG PO TABS
7.0000 mg | ORAL_TABLET | Freq: Every day | ORAL | Status: DC
  Filled 2023-05-18: qty 2

## 2023-05-18 MED ORDER — ACETAMINOPHEN 325 MG PO TABS: 650.0000 mg | ORAL_TABLET | Freq: Four times a day (QID) | ORAL | Status: DC | PRN

## 2023-05-18 MED ORDER — INSULIN ASPART 100 UNIT/ML IJ SOLN
0.0000 [IU] | Freq: Three times a day (TID) | INTRAMUSCULAR | Status: DC
  Administered 2023-05-19 – 2023-05-20 (×2): 1 [IU] via SUBCUTANEOUS
  Administered 2023-05-20: 3 [IU] via SUBCUTANEOUS
  Administered 2023-05-20 – 2023-05-21 (×3): 2 [IU] via SUBCUTANEOUS
  Administered 2023-05-22: 1 [IU] via SUBCUTANEOUS
  Administered 2023-05-22: 2 [IU] via SUBCUTANEOUS
  Administered 2023-05-23 (×2): 1 [IU] via SUBCUTANEOUS
  Filled 2023-05-18: qty 0.09

## 2023-05-18 MED ORDER — FLUTICASONE FUROATE-VILANTEROL 200-25 MCG/ACT IN AEPB
1.0000 | INHALATION_SPRAY | Freq: Every day | RESPIRATORY_TRACT | Status: DC
  Administered 2023-05-19 – 2023-05-24 (×6): 1 via RESPIRATORY_TRACT
  Filled 2023-05-18: qty 28

## 2023-05-18 MED ORDER — INSULIN ASPART 100 UNIT/ML IJ SOLN
0.0000 [IU] | Freq: Every day | INTRAMUSCULAR | Status: DC
Start: 2023-05-18 — End: 2023-05-24
  Administered 2023-05-23: 2 [IU] via SUBCUTANEOUS
  Filled 2023-05-18: qty 0.05

## 2023-05-18 MED ORDER — CLOPIDOGREL BISULFATE 75 MG PO TABS
75.0000 mg | ORAL_TABLET | Freq: Every day | ORAL | Status: DC
  Administered 2023-05-19 – 2023-05-24 (×6): 75 mg via ORAL
  Filled 2023-05-18 (×6): qty 1

## 2023-05-18 MED ORDER — SACUBITRIL-VALSARTAN 24-26 MG PO TABS
1.0000 | ORAL_TABLET | Freq: Two times a day (BID) | ORAL | Status: DC
  Administered 2023-05-19 – 2023-05-24 (×10): 1 via ORAL
  Filled 2023-05-18 (×12): qty 1

## 2023-05-18 MED ORDER — FUROSEMIDE 40 MG PO TABS: 40.0000 mg | ORAL_TABLET | ORAL | Status: DC

## 2023-05-18 MED ORDER — SODIUM CHLORIDE 0.9 % IV SOLN
2.0000 g | Freq: Once | INTRAVENOUS | Status: AC
  Administered 2023-05-18: 2 g via INTRAVENOUS
  Filled 2023-05-18: qty 12.5

## 2023-05-18 MED ORDER — DOCUSATE SODIUM 100 MG PO CAPS
100.0000 mg | ORAL_CAPSULE | Freq: Two times a day (BID) | ORAL | Status: DC
  Administered 2023-05-19 – 2023-05-20 (×4): 100 mg via ORAL
  Filled 2023-05-18 (×12): qty 1

## 2023-05-18 MED ORDER — UMECLIDINIUM BROMIDE 62.5 MCG/ACT IN AEPB
1.0000 | INHALATION_SPRAY | Freq: Every day | RESPIRATORY_TRACT | Status: DC
  Administered 2023-05-19 – 2023-05-24 (×6): 1 via RESPIRATORY_TRACT
  Filled 2023-05-18: qty 7

## 2023-05-18 MED ORDER — EMPAGLIFLOZIN 10 MG PO TABS
10.0000 mg | ORAL_TABLET | Freq: Every day | ORAL | Status: DC
  Administered 2023-05-19 – 2023-05-24 (×6): 10 mg via ORAL
  Filled 2023-05-18 (×6): qty 1

## 2023-05-18 NOTE — Assessment & Plan Note (Signed)
 05-18-2023 present on admission. Acute hypoxia with RA sats 78%. WBC 15.8, RLL pneumonia in CT chest

## 2023-05-18 NOTE — Assessment & Plan Note (Signed)
 05-18-2023 continue ultram.  05-19-2023 due to excessive sleepiness, change ultram to po prn. As well as gabapentin to prn.  05-20-2023 stable. On prn gabapentin and ultram

## 2023-05-18 NOTE — Sepsis Progress Note (Signed)
 Elink following code sepsis

## 2023-05-18 NOTE — ED Provider Notes (Signed)
 [facility] Provider Note   CSN: [id number] Arrival date & time: 05/18/23  1118     History  Chief Complaint  Patient presents with   Cough   Fever    Ebony Scott is a 73 y.o. female with an extremely complicated past medical history that includes severe asthma and rheumatoid arthritis, chronic immunocompromise, diabetes,  heart failure and history of blood clots.  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  She does not use oxygen during the daytime when she is weight.  Patient reports she started feeling bad yesterday, woke up feeling very short of breath and had a high fever above 102 this morning.  Upon arrival patient's oxygen saturation was 78% on room air she is currently requiring 6 L via nasal cannula.  She reports chronic cough.  She has a history of recurrent pneumonia.  She is currently on immunoglobulin therapy due to severe immunocompromise.  He is on both Plavix and Eliquis which she is compliant with.  Patient states that this does not feel like her asthma.   Cough Associated symptoms: fever   Fever Associated symptoms: cough        Home Medications Prior to Admission medications   Medication Sig Start Date End Date Taking? Authorizing Provider  acetaminophen (TYLENOL) 500 MG tablet Take 1,000 mg by mouth as needed for moderate pain.    [provider]  albuterol (PROVENTIL) (2.5 MG/3ML) 0.083% nebulizer solution Take 3 mLs (2.5 mg total) by nebulization every 4 (four) hours as needed for wheezing or shortness of breath. 01/01/23   Kozlow, [name], MD  albuterol (VENTOLIN HFA) 108 (90 Base) MCG/ACT inhaler Inhale 2 puffs into the lungs every 6 (six) hours as needed for wheezing or shortness of breath. 01/01/23   Kozlow, [name], MD  allopurinol (ZYLOPRIM) 100 MG tablet Take 1 tablet (100 mg total) by mouth daily. 03/04/23   [name], MD  Alpha-D-Galactosidase (BEANO PO) Take 2-3 tablets by mouth 3 (three) times daily as needed (Gas).    [provider]  Alpha-Lipoic Acid 600 MG TABS Take 600 mg by mouth in the morning.    [provider]  atorvastatin (LIPITOR) 80 MG tablet Take 1 tablet (80 mg total) by mouth every evening. 03/04/23   [name], MD  azelastine (ASTELIN) 0.1 % nasal spray Place 2 sprays into both nostrils daily as needed for rhinitis. Use in each nostril as directed 04/16/23   Kozlow, [name], MD  cetirizine (ZYRTEC) 10 MG tablet Take 1 tablet (10 mg total) by mouth daily as needed for allergies (Can take a n extra dose during flare ups.). 04/16/23   Kozlow, [name], MD  Cholecalciferol (VITAMIN D3) 25 MCG (1000 UT) capsule Take 1,000 Units by mouth at bedtime.    [provider]  clopidogrel (PLAVIX) 75 MG tablet TAKE 1 TABLET BY MOUTH EVERY DAY 05/07/23   Jodelle Red, MD  denosumab (PROLIA) 60 MG/ML SOSY injection Inject 60 mg into the skin every 6 (six) months.    [provider]  dexlansoprazole (DEXILANT) 60 MG capsule Take 1 capsule (60 mg total) by mouth in the morning. 04/16/23   Kozlow, [name], MD  diclofenac Sodium (VOLTAREN) 1 % GEL Apply 1 Application topically as needed (pain).    [provider]  empagliflozin (JARDIANCE) 10 MG TABS tablet Take  1 tablet (10 mg total) by mouth daily. 02/07/23   [name], MD  ENTRESTO 24-26 MG TAKE 1 TABLET BY MOUTH TWICE A DAY 05/07/23   Jodelle Red, MD  EPINEPHRINE 0.3 mg/0.3 mL IJ SOAJ injection USE AS DIRECTED BY YOUR PHYSICIAN INTRAMUSCULARLY AS NEEDED FOR ANAPHYLAXIS 10/09/22   Kozlow, [name], MD  famotidine (PEPCID) 20 MG tablet TAKE 1 TABLET (20 MG) BY MOUTH DAILY BEFORE LUNCH AND1 TABLET BY MOUTH EACH NIGHT AT BEDTIME 05/07/23   Kozlow, [name], MD  Fluticasone-Umeclidin-Vilant (TRELEGY ELLIPTA) 200-62.5-25 MCG/ACT AEPB INHALE 1 INHALATION INTO THE LUNGS IN THE MORNING. 04/16/23    Kozlow, [name], MD  folic acid (FOLVITE) 1 MG tablet Take 1 tablet (1 mg total) by mouth daily. 03/04/23   [name], MD  furosemide (LASIX) 40 MG tablet Take 40 mg by mouth 2 (two) times a week. Monday and Thursday 01/18/23   [provider]  gabapentin (NEURONTIN) 300 MG capsule Take 1 capsule (300 mg total) by mouth at bedtime. 05/04/22   Lewie Chamber, MD  guaiFENesin (MUCINEX) 600 MG 12 hr tablet Take 600 mg by mouth 2 (two) times daily.    [provider]  HIZENTRA 10 GM/50ML SOLN Inject 15 g into the skin once a week. 13 g Infusion 01/14/23   Kozlow, [name], MD  Immune Globulin, Human, (HIZENTRA) 1 GM/5ML SOLN Inject 1 g into the skin. Patient not taking: Reported on 05/13/2023    [provider]  Immune Globulin, Human, (HIZENTRA) 10 GM/50ML SOSY INFUSE 13 GRAMS SUBCUTANEOUSLY EVERY SEVEN DAYS 05/10/23   Kozlow, [name], MD  ipratropium-albuterol (DUONEB) 0.5-2.5 (3) MG/3ML SOLN Take 3 mLs by nebulization every 6 (six) hours as needed. 11/14/21   Martina Sinner, MD  isosorbide-hydrALAZINE (BIDIL) 20-37.5 MG tablet TAKE 1 TABLET BY MOUTH TWICE A DAY 05/07/23   Jodelle Red, MD  Lactase (LACTOSE FAST ACTING RELIEF PO) Take 2-3 tablets by mouth 3 (three) times daily before meals. For dairy food or drink    [provider]  magnesium oxide (MAG-OX) 400 (240 Mg) MG tablet Take 400 mg by mouth at bedtime.    [provider]  montelukast (SINGULAIR) 10 MG tablet Take 1 tablet (10 mg total) by mouth daily after supper. 03/04/23   [name], MD  mupirocin ointment (BACTROBAN) 2 % Apply 1 Application topically as needed (wounds). 01/29/23   Kozlow, [name], MD  nystatin (MYCOSTATIN) 100000 UNIT/ML suspension 5 mls swish, gargle, swallow after Trelegy use 05/14/23   Kozlow, [name], MD  potassium chloride (KLOR-CON) 10 MEQ tablet Take 10 mEq by mouth daily. 01/18/23   [provider]  predniSONE (DELTASONE) 5 MG tablet Take 1  tablet (5 mg total) by mouth daily with breakfast. 05/13/23   Rice, [name], MD  Prenatal Vit-Fe Fumarate-FA (PRENATAL VITAMINS PO) Take 1 tablet by mouth in the morning.    [provider]  Probiotic Product (ALIGN) 4 MG CAPS Take 4 mg by mouth every evening.    [provider]  Respiratory Therapy Supplies (NEBULIZER MASK ADULT) MISC 1 Device by Does not apply route as directed. 01/01/23   Kozlow, [name], MD  rivaroxaban (XARELTO) 20 MG TABS tablet Take 1 tablet (20 mg total) by mouth daily with supper. 03/04/23   [name], MD  traMADol (ULTRAM) 50 MG tablet Take 1 tablet (50 mg total) by mouth at bedtime. 02/19/23   [name], MD  traZODone (DESYREL)  50 MG tablet Take 2 tablets (100 mg total) by mouth at bedtime. Patient taking differently: Take 50 mg by mouth at bedtime. 02/01/23   [name], MD      Allergies    Baclofen, Other, Pork-derived products, Shellfish allergy, Shrimp extract, Tetracycline hcl, Xolair [omalizumab], Zoledronic acid, Celecoxib, Dilaudid [hydromorphone hcl], Hydrocodone-acetaminophen, Levofloxacin, Molds & smuts, Morphine and codeine, Oxycodone hcl, Paroxetine, Penicillins, Tetracycline hcl, Oxycodone-acetaminophen, Diltiazem hcl, Dust mite extract, Gamunex [immune globulin], Penicillin g procaine, Rituximab, Rofecoxib, and Tree extract    Review of Systems   Review of Systems  Constitutional:  Positive for fever.  Respiratory:  Positive for cough.     Physical Exam Updated Vital Signs BP (!) 148/60   Pulse 77   Temp 98.6 F (37 C)   Resp 18   Ht 5' (1.524 m)   Wt 58.5 kg   SpO2 96%   BMI 25.19 kg/m  Physical Exam Constitutional:      Appearance: She is ill-appearing and toxic-appearing.     Comments: Flushed, Hot to the touch  HENT:     Head: Normocephalic and atraumatic.     Mouth/Throat:     Mouth: Mucous membranes are dry.  Eyes:     Extraocular Movements: Extraocular movements intact.     Pupils:  Pupils are equal, round, and reactive to light.  Cardiovascular:     Rate and Rhythm: Tachycardia present.  Pulmonary:     Effort: Tachypnea present. No respiratory distress.     Breath sounds: No decreased air movement. Examination of the right-lower field reveals rhonchi. Rhonchi present. No wheezing.  Musculoskeletal:     Cervical back: Normal range of motion.  Neurological:     Mental Status: She is alert.     ED Results / Procedures / Treatments   Labs (all labs ordered are listed, but only abnormal results are displayed) Labs Reviewed  COMPREHENSIVE METABOLIC PANEL WITH GFR - Abnormal; Notable for the following components:      Result Value   BUN 42 (*)    Creatinine, Ser 1.55 (*)    Calcium 8.3 (*)    Albumin 2.8 (*)    ALT 55 (*)    GFR, Estimated 35 (*)    All other components within normal limits  CBC WITH DIFFERENTIAL/PLATELET - Abnormal; Notable for the following components:   WBC 15.8 (*)    RBC 3.07 (*)    Hemoglobin 10.4 (*)    HCT 33.1 (*)    MCV 107.8 (*)    RDW 18.9 (*)    Platelets 135 (*)    Neutro Abs 13.0 (*)    Monocytes Absolute 1.5 (*)    Abs Immature Granulocytes 0.20 (*)    All other components within normal limits  PROTIME-INR - Abnormal; Notable for the following components:   Prothrombin Time 21.2 (*)    INR 1.8 (*)    All other components within normal limits  APTT - Abnormal; Notable for the following components:   aPTT 37 (*)    All other components within normal limits  BLOOD GAS, VENOUS - Abnormal; Notable for the following components:   pCO2, Ven 36 (*)    pO2, Ven 53 (*)    All other components within normal limits  BRAIN NATRIURETIC PEPTIDE - Abnormal; Notable for the following components:   B Natriuretic Peptide 302.6 (*)    All other components within normal limits  RESP PANEL BY RT-PCR (RSV, FLU A&B, COVID)  RVPGX2  CULTURE,  BLOOD (ROUTINE X 2)  CULTURE, BLOOD (ROUTINE X 2)  GLUCOSE, CAPILLARY  URINALYSIS, W/ REFLEX TO  CULTURE (INFECTION SUSPECTED)  STREP PNEUMONIAE URINARY ANTIGEN  LEGIONELLA PNEUMOPHILA SEROGP 1 UR AG  COMPREHENSIVE METABOLIC PANEL WITH GFR  CBC WITH DIFFERENTIAL/PLATELET  I-STAT CG4 LACTIC ACID, ED    EKG EKG Interpretation Date/Time:  Saturday May 18 2023 11:49:35 EDT Ventricular Rate:  93 PR Interval:  134 QRS Duration:  79 QT Interval:  378 QTC Calculation: 471 R Axis:   43  Text Interpretation: Sinus rhythm Anterior infarct, old Non-specific ST-t changes No significant change since last tracing Confirmed by Margarita Grizzle [phone]) on 05/18/2023 12:26:07 PM  Radiology CT Chest Wo Contrast Result Date: 05/18/2023 CLINICAL DATA:  Pneumonia, complications suspected. Cough and fever since yesterday. Recent hospitalization for pneumonia. EXAM: CT CHEST WITHOUT CONTRAST TECHNIQUE: Multidetector CT imaging of the chest was performed following the standard protocol without IV contrast. RADIATION DOSE REDUCTION: This exam was performed according to the departmental dose-optimization program which includes automated exposure control, adjustment of the mA and/or kV according to patient size and/or use of iterative reconstruction technique. COMPARISON:  CT 03/29/2023.  Radiographs 05/18/2023 and 05/02/2023. FINDINGS: Cardiovascular: Diffuse atherosclerosis of the aorta, great vessels and coronary arteries. Left subclavian AICD lead projects to the right ventricular apex. Probable calcifications of the aortic valve. Stable mild cardiac enlargement. No significant pericardial fluid. Mediastinum/Nodes: There are no enlarged mediastinal, hilar or axillary lymph nodes.Mildly prominent subcarinal lymph node, likely reactive. The thyroid gland, trachea and esophagus demonstrate no significant findings. Lungs/Pleura: No pleural effusion or pneumothorax. Motion on the previous study limits comparison. There is progressively dense airspace disease within the superior segment of the right lower lobe there are  new patchy airspace opacities within the right upper and middle lobes, suspicious for progressive pneumonia. Mild linear scarring or atelectasis in the left lower lobe. No evidence of left lung infiltrates. Upper abdomen: No significant findings are seen in the upper abdomen status post cholecystectomy. There is no adrenal mass. Aortic atherosclerosis noted. Musculoskeletal/Chest wall: There is no chest wall mass or suspicious osseous finding. Previous right shoulder reverse arthroplasty and cervical fusion. 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 Postsurgical changes in the left breast. IMPRESSION: 1. Progressive airspace disease within the superior segment of the right lower lobe and new patchy airspace opacities within the right upper and middle lobes, suspicious for progressive multilobar pneumonia. It is difficult to tell radiographically whether this represents progression of the pneumonia seen on previous CT from 7 weeks ago or recurrent pneumonia. Correlate clinically. Radiographic follow up recommended. 2. No pleural effusion or pneumothorax. 3.  Aortic Atherosclerosis (ICD10-I70.0). Electronically Signed   By: Carey Bullocks M.D.   On: 05/18/2023 15:45   DG Chest Port 1 View Result Date: 05/18/2023 CLINICAL DATA:  Cough and fever since yesterday. EXAM: PORTABLE CHEST 1 VIEW COMPARISON:  May 02, 2023. FINDINGS: The heart size and mediastinal contours are within normal limits. Both lungs are clear. Stable single lead left-sided defibrillator. Status post right shoulder arthroplasty. Degenerative changes are seen involving the left shoulder. IMPRESSION: No active disease. Electronically Signed   By: Lupita Raider M.D.   On: 05/18/2023 12:55    Procedures .Critical Care  Performed by: Arthor Captain, PA-C Authorized by: Arthor Captain, PA-C   Critical care provider statement:    Critical care time (minutes):  30   Critical care time was  exclusive of:  Separately billable procedures and treating other  patients   Critical care was necessary to treat or prevent imminent or life-threatening deterioration of the following conditions:  Respiratory failure   Critical care was time spent personally by me on the following activities:  Development of treatment plan with patient or surrogate, discussions with consultants, evaluation of patient's response to treatment, examination of patient, ordering and review of laboratory studies, ordering and review of radiographic studies, ordering and performing treatments and interventions, pulse oximetry, re-evaluation of patient's condition and review of old charts     Medications Ordered in ED Medications  lactated ringers infusion ( Intravenous New Bag/Given 05/18/23 1334)  sacubitril-valsartan (ENTRESTO) 24-26 mg per tablet (has no administration in time range)  furosemide (LASIX) tablet 40 mg (has no administration in time range)  isosorbide-hydrALAZINE (BIDIL) 20-37.5 MG per tablet 1 tablet (has no administration in time range)  empagliflozin (JARDIANCE) tablet 10 mg (has no administration in time range)  predniSONE (DELTASONE) tablet 7 mg (has no administration in time range)  clopidogrel (PLAVIX) tablet 75 mg (has no administration in time range)  rivaroxaban (XARELTO) tablet 20 mg (has no administration in time range)  gabapentin (NEURONTIN) capsule 300 mg (has no administration in time range)  fluticasone furoate-vilanterol (BREO ELLIPTA) 200-25 MCG/ACT 1 puff (1 puff Inhalation Not Given 05/18/23 1619)    And  umeclidinium bromide (INCRUSE ELLIPTA) 62.5 MCG/ACT 1 puff (1 puff Inhalation Not Given 05/18/23 1619)  ipratropium-albuterol (DUONEB) 0.5-2.5 (3) MG/3ML nebulizer solution 3 mL (3 mLs Nebulization Given 05/18/23 1735)  montelukast (SINGULAIR) tablet 10 mg (has no administration in time range)  cefTRIAXone (ROCEPHIN) 2 g in sodium chloride 0.9 % 100 mL IVPB (has no administration in  time range)  azithromycin (ZITHROMAX) tablet 500 mg (500 mg Oral Given 05/18/23 1739)  acetaminophen (TYLENOL) tablet 650 mg (has no administration in time range)    Or  acetaminophen (TYLENOL) suppository 650 mg (has no administration in time range)  ondansetron (ZOFRAN) tablet 4 mg (has no administration in time range)    Or  ondansetron (ZOFRAN) injection 4 mg (has no administration in time range)  docusate sodium (COLACE) capsule 100 mg (has no administration in time range)  melatonin tablet 10 mg (has no administration in time range)  traMADol (ULTRAM) tablet 50 mg (has no administration in time range)  atorvastatin (LIPITOR) tablet 80 mg (has no administration in time range)  insulin aspart (novoLOG) injection 0-9 Units ( Subcutaneous Not Given 05/18/23 1740)  insulin aspart (novoLOG) injection 0-5 Units (has no administration in time range)  ipratropium-albuterol (DUONEB) 0.5-2.5 (3) MG/3ML nebulizer solution (  Not Given 05/18/23 1735)  vancomycin (VANCOCIN) IVPB 1000 mg/200 mL premix (1,000 mg Intravenous New Bag/Given 05/18/23 1410)  ceFEPIme (MAXIPIME) 2 g in sodium chloride 0.9 % 100 mL IVPB (0 g Intravenous Stopped 05/18/23 1347)    ED Course/ Medical Decision Making/ A&P Clinical Course as of 05/18/23 1756  Sat May 18, 2023  1314 Temp: 100 F (37.8 C) [AH]  1353 Lactic Acid, Venous: 1.3 [AH]  1424 WBC(!): 15.8 [AH]    Clinical Course User Index [AH] Arthor Captain, PA-C                                 Medical Decision Making Amount and/or Complexity of Data Reviewed Labs: ordered. Decision-making details documented in ED Course. Radiology: ordered.  Risk Prescription drug management. Decision regarding hospitalization.   This patient presents to the ED with chief complaint(s)  of cough fever and shortness of breath with pertinent past medical history of chronic immunocompromise, history of blood clots, history of diabetes, history of pneumonia, history of heart  failure which further complicates the presenting complaint. The complaint involves an extensive differential diagnosis and treatment options and also carries with it a high risk of complications and morbidity.    The differential diagnosis includes aspiration, PE with infarct less likely, pneumothorax spontaneous, empyema, pleural effusion.  Most likely pneumonia.  The initial plan is to order sepsis protocol rectal temperature and to treat patient with oxygen, fluids, antipyretics should she have a confirmed elevated rectal temperature, broad-spectrum antibiotics for SPECT of pneumonia  Additional Tests and treatment considered: Patient is low risk for pulmonary embolus considering current coverage with both Eliquis and Plavix along with compliance with medication, therefore do not feel that CT angiogram is indicated is indicated.  Additional history obtained: Additional history obtained from  EMR   Reassessment and review (also see workup area): Lab Tests: I Ordered, and personally interpreted labs.  The pertinent results include:    As per ED course.  No elevated lactic acid level.  CMP with mildly elevated creatinine.  CBC with white blood cell count 15.8 thousand.  Respiratory panel negative.  Imaging Studies: I ordered and independently visualized and interpreted the following imaging chest x-ray which showed no acute findings The interpretation of the imaging was limited to assessing for emergent pathology, for which purpose it was ordered.  Consultations Obtained: I requested consultation with the Dr. Imogene Burn, and discussed  findings as well as pertinent plan - they recommend: Admission  Medicines ordered and prescription drug management: I ordered the following medications fluids, broad-spectrum antibiotics for presumed pneumonia  Reevaluation of the patient after these medicines showed that the patient    improved    Cardiac Monitoring: The patient was maintained on a cardiac  monitor.  I personally viewed and interpreted the cardiac monitor which showed an underlying rhythm of:  sinus rhythm  Complexity of problems addressed: Patient's presentation is most consistent with  acute presentation with potential threat to life or bodily function During patient's assessment  Disposition: After consideration of the diagnostic results and the patient's response to treatment,  I feel that the patent would benefit from admission likely for pneumonia.  And respiratory failure with hypoxia .         Final Clinical Impression(s) / ED Diagnoses Final diagnoses:  Fever, unspecified fever cause  Acute respiratory failure with hypoxia [facility])    Rx / DC Orders ED Discharge Orders     None         Arthor Captain, PA-C 05/18/23 1800    Margarita Grizzle, MD 05/23/23 1148

## 2023-05-18 NOTE — ED Triage Notes (Signed)
 Home health called out for fever and cough that began yesterday. Recent pneumonia hospitalization. EMS noted rhonchi in the lungs. 102 Fever prior to Tylenol. Family is concerned for possible UTI.    EMS Vitals: 111/57 BP 95% Neb,  90% room air 84 P 99.3 Temp 137 CBG 16 RR

## 2023-05-18 NOTE — Assessment & Plan Note (Signed)
 05-18-2023 chronic. Stable. Continue Xarelto.  05-19-2023 chronic. Stable. Continue xarelto.  05-20-2023 chronic. Stable.  05-21-2023 chronic. Stable. Continue xarelto

## 2023-05-18 NOTE — Assessment & Plan Note (Signed)
 05-18-2023 chronic. Stable.  05-19-2023 chronic. Stable.  05-20-2023 chronic. Stable.  05-21-2023 chronic. stable

## 2023-05-18 NOTE — Assessment & Plan Note (Signed)
 05-18-2023 continue with prednisone 7 mg daily. If she becomes hypotensive, give 100 mg IV solucortef.  05-19-2023 pt is relatively adrenal insufficiency given her prolonged use of prednisone. Not hypotensive. Will give 40 gm IV solumedrol and increase her prednisone to 20 mg daily(on chronic 7 mg daily).  05-20-2023 on prednisone 20 mg daily due to acute illness. S/p 40 mg IV solumedrol on 05-19-2023.  05-21-2023 continue prednisone 20 mg daily. Will need to see outpatient rheumatology after discharge to taper back down. Would send home with prednisone 20 mg daily and f/u with rheum to taper her back down to 7 mg daily.

## 2023-05-18 NOTE — Assessment & Plan Note (Signed)
 05-18-2023 f/u with Dr. Dimple Casey with rheum. Continue with po prednisone.  05-19-2023 chronically on 7 mg daily prednisone. Will increase to 20 mg daily due to acute illness.  05-20-2023 stable. On 20 mg prednisone due to acute illness.  05-21-2023 continue prednisone 20 mg daily. Will need to see outpatient rheumatology after discharge to taper back down. Would send home with prednisone 20 mg daily and f/u with rheum to taper her back down to 7 mg daily.

## 2023-05-18 NOTE — Assessment & Plan Note (Signed)
 05-18-2023 acutely worse due to RLL pneumonia. Continue continuous supplemental O2.  05-19-2023 continue with nocturnal supplemental O2.  05-20-2023 chronic. Stable.

## 2023-05-18 NOTE — Assessment & Plan Note (Signed)
 05-18-2023 chronic. Pt states her dtr [name] is aware of her wishes. Pt states dtr [name] is a Engineer, civil (consulting).

## 2023-05-18 NOTE — Assessment & Plan Note (Addendum)
 05-18-2023 baseline Scr 1.3-1.5. today Scr 1.55  05-19-2023 after IVF overnight. Scr 1.38. stop IVF today. Allow pt to orally hydrate.  05-20-2023 Scr 1.22, BUN 31. Stable.  05-21-2023 stable. Scr 1.29, BUN 35

## 2023-05-18 NOTE — Assessment & Plan Note (Signed)
 05-18-2023 chronic. Stable. Add SSI. Continue jardiance  05-19-2023 chronic. Stable. Continue SSI. May need to add lantus if increase dose of prednisone causes hyperglycemia.  05-20-2023 chronic. Stable.  05-21-2023 chronic. Stable. CBG stable

## 2023-05-18 NOTE — Hospital Course (Signed)
 HPI: 73 year old Caucasian female history of seropositive rheumatoid arthritis on chronic prednisone, chronic respiratory failure on oxygen only at night, chronic prednisone use with 7 mg of prednisone a day, prior history of hypogammaglobulinemia, CKD stage IIIb baseline creatinine 1.3-1.5, chronic pain syndrome, history of tracheobronchomalacia, prior history of PE on chronic anticoagulation presents to the ER today with a 1 day history of feeling poorly.  1 day history of fever.  She has had about 4 bouts of pneumonia in the last year.  She was discharged to home during mid February 2025 due to pneumonia.  She completed a 5-day course of Keflex at home.  She states that she started having a worsening cough yesterday.  She had a fever today.  She normally checks her oxygen level at home.  It read in the mid 70s today on room air.  Patient came to the ER for evaluation.  On arrival to the ER room air saturations of 70%.  Heart rate 94.  Blood pressure 109/49.  Initial labs lactic acid of 1.3 White count of 15.8, hemoglobin 10.4, platelets of 135  Sodium 139, potassium 3.7, bicarb 23, BUN of 42, creatinine 1.55, glucose 98 COVID-negative, influenza negative, RSV negative  BNP elevated at 302  VBG showed a pH of 7.4, pCO2 36  Initial chest x-ray showed no active disease.  [facility] hospitalist consulted due to presumed pneumonia and acute on chronic hypoxic respiratory failure.  After further discussion with the EDP, requested that a order a CT chest without contrast.  This was performed and did demonstrate a right lower lobe pneumonia and a right middle lobe pneumonia.

## 2023-05-18 NOTE — Assessment & Plan Note (Addendum)
 05-18-2023 RLL PNA seen on CT chest today. admit to med/surg bed. Continue with IV rocephin/zithromax. Hx of hypogammaglobulinemia. Pt has had 4 bouts of pneumonia in the last year. May need to consider outpatient ID consult to consider IVIG as outpatient. Check strep and legionella. Hx of RLL pneumonia. Pt states she occ has trouble with swallowing. Will have ST evaluate her for potential aspiration.  05-19-2023 continue with rocephin/zithromax. Strep pneumo and legionella sent. Continue with QID nebs  05-20-2023 WBC down to 12.2K. was 15.8 on admission.  Strep pneumo negative. Awaiting legionella. Continue rocephin/zithromax. Needs PT eval. Asked pt to spend more time in recliner today and not lying down in bed. Continue with prednisone 20 mg every day and QID nebs.  05-21-2023 febrile this AM to 101.3, WBC 14.7. not ready for DC. Continue with chest physiotherapy with vest. Nebs qid. Blood cx negative. Will need to be afebrile to 24 hours prior to DC. Continue to work with PT. Plan for DC to home with dtr when she is stable for DC.

## 2023-05-18 NOTE — H&P (Addendum)
 History and Physical    Ebony Scott ZOX:[id number] DOB: Mar 02, 1950 DOA: 05/18/2023  DOS: the patient was seen and examined on 05/18/2023  PCP: [name], MD   Patient coming from: Home  I have personally briefly reviewed patient's old medical records in [facility]  HPI: 73 year old Caucasian female history of seropositive rheumatoid arthritis on chronic prednisone, chronic respiratory failure on oxygen only at night, chronic prednisone use with 7 mg of prednisone a day, prior history of hypogammaglobulinemia, CKD stage IIIb baseline creatinine 1.3-1.5, chronic pain syndrome, history of tracheobronchomalacia, prior history of PE on chronic anticoagulation presents to the ER today with a 1 day history of feeling poorly.  1 day history of fever.  She has had about 4 bouts of pneumonia in the last year.  She was discharged to home during mid February 2025 due to pneumonia.  She completed a 5-day course of Keflex at home.  She states that she started having a worsening cough yesterday.  She had a fever today.  She normally checks her oxygen level at home.  It read in the mid 70s today on room air.  Patient came to the ER for evaluation.  On arrival to the ER room air saturations of 70%.  Heart rate 94.  Blood pressure 109/49.  Initial labs lactic acid of 1.3 White count of 15.8, hemoglobin 10.4, platelets of 135  Sodium 139, potassium 3.7, bicarb 23, BUN of 42, creatinine 1.55, glucose 98 COVID-negative, influenza negative, RSV negative  BNP elevated at 302  VBG showed a pH of 7.4, pCO2 36  Initial chest x-ray showed no active disease.  [facility] consulted due to presumed pneumonia and acute on chronic hypoxic respiratory failure.  After further discussion with the EDP, requested that a order a CT chest without contrast.  This was performed and did demonstrate a right lower lobe pneumonia and a right middle lobe pneumonia.   ED Course: given IV  cefepime/vanco.  Review of Systems:  Review of Systems  Constitutional:  Positive for fever and malaise/fatigue. Negative for chills.  HENT: Negative.    Eyes: Negative.   Respiratory:  Positive for cough and shortness of breath.   Cardiovascular: Negative.   Gastrointestinal: Negative.   Genitourinary: Negative.   Musculoskeletal:  Positive for back pain.       Chronic back pain, chronic joint pain due to RA  Skin: Negative.   Neurological: Negative.   Endo/Heme/Allergies: Negative.   Psychiatric/Behavioral: Negative.    All other systems reviewed and are negative.   Past Medical History:  Diagnosis Date   Anemia    Angio-edema    Breast cancer (HCC) 1998   Left breast, in remission   Bronchiectasis (HCC)    CHF (congestive heart failure) (HCC)    Closed displaced fracture of metatarsal bone of right foot 01/20/2019   Closed fracture of fifth metatarsal bone of right foot, initial encounter 01/15/2018   Clostridium difficile colitis 01/14/2018   Coronary artery disease    Depression    "some; don't take anything for it" (02/20/2012), pt denies dx as of 06/02/21   Diverticulosis    DVT (deep venous thrombosis) (HCC)    Fibromyalgia 11/2011   Fracture of base of fifth metatarsal bone with routine healing, left    GAVE (gastric antral vascular ectasia)    GERD (gastroesophageal reflux disease)    Headache(784.0)    "related to allergies; more at different times during the year" (02/20/2012)   Hemorrhoids  Hiatal hernia    back and neck   History of blood transfusion 2023   1 unit, 5 units of Iron   Hx of adenomatous colonic polyps 04/12/2016   Hypercholesteremia    good cholesterol is high   Hypothyroidism    h/o Graves disease   IBS (irritable bowel syndrome)    Moderate persistent asthma    -FeV1 72% 2011, -IgE 102 2011, CT sinus Neg 2011   Osteomyelitis of second toe of right foot (HCC) 09/23/2020   Osteoporosis    on reclast yearly   Pneumonia    Pulmonary  embolism (HCC) 07/29/2018   Septic olecranon bursitis of right elbow 09/22/2020   Seronegative rheumatoid arthritis (HCC)    Dr. Casimer Lanius   Sleep apnea    uses cpap nightly   Tracheobronchomalacia    Vasculitis (HCC) 12/13/2017    Past Surgical History:  Procedure Laterality Date   ABDOMINAL HYSTERECTOMY N/A    Phreesia 10/21/2019   ANTERIOR AND POSTERIOR REPAIR  1990's   APPENDECTOMY     ARGON LASER APPLICATION  02/05/2021   Procedure: ARGON LASER APPLICATION;  Surgeon: Benancio Deeds, MD;  Location: [facility];  Service: Gastroenterology;;   BREAST LUMPECTOMY  1998   left   BREAST SURGERY N/A    Phreesia 10/21/2019   BRONCHIAL WASHINGS  04/05/2020   Procedure: BRONCHIAL WASHINGS;  Surgeon: Martina Sinner, MD;  Location: [facility];  Service: Pulmonary;;   CAPSULOTOMY Right 08/28/2021   Procedure: CAPSULOTOMY;  Surgeon: Candelaria Stagers, DPM;  Location: [facility];  Service: Podiatry;  Laterality: Right;   CARPOMETACARPEL (CMC) FUSION OF THUMB WITH AUTOGRAFT FROM RADIUS  ~ 2009   "both thumbs" (02/20/2012)   CATARACT EXTRACTION W/ INTRAOCULAR LENS  IMPLANT, BILATERAL  2012   CERVICAL DISCECTOMY  10/2001   C5-C6   CERVICAL FUSION  2003   C3-C4   CHOLECYSTECTOMY     COLONOSCOPY     CORONARY STENT INTERVENTION N/A 01/11/2021   Procedure: CORONARY STENT INTERVENTION;  Surgeon: Marykay Lex, MD;  Location: [facility] INVASIVE CV LAB;  Service: Cardiovascular;  Laterality: N/A;   DEBRIDEMENT TENNIS ELBOW  ?1970's   right   ESOPHAGOGASTRODUODENOSCOPY     ESOPHAGOGASTRODUODENOSCOPY (EGD) WITH PROPOFOL N/A 02/05/2021   Procedure: ESOPHAGOGASTRODUODENOSCOPY (EGD) WITH PROPOFOL;  Surgeon: Benancio Deeds, MD;  Location: [facility];  Service: Gastroenterology;  Laterality: N/A;   EYE SURGERY N/A    Phreesia 10/21/2019   HAMMER TOE SURGERY Right 08/28/2021   Procedure: HAMMER TOE REPAIR 2ND TOE RIGHT FOOT;  Surgeon: Candelaria Stagers, DPM;  Location: [facility];  Service: Podiatry;   Laterality: Right;   HYSTERECTOMY     I & D EXTREMITY Right 09/22/2020   Procedure: IRRIGATION AND DEBRIDEMENT RIGHT HAND AND ELBOW;  Surgeon: Teryl Lucy, MD;  Location: [facility];  Service: Orthopedics;  Laterality: Right;   ICD IMPLANT N/A 08/27/2022   Procedure: ICD IMPLANT;  Surgeon: Maurice Small, MD;  Location: [facility];  Service: Cardiovascular;  Laterality: N/A;   KNEE ARTHROPLASTY  ?1990's   "?right; w/cartilage repair" (02/20/2012)   MASS EXCISION Left 08/28/2021   Procedure: EXCISION OF SOFT TISSUE  MASS LEFT FOOT;  Surgeon: Candelaria Stagers, DPM;  Location: [facility];  Service: Podiatry;  Laterality: Left;   NASAL SEPTUM SURGERY  1980's   POSTERIOR CERVICAL FUSION/FORAMINOTOMY  2004   "failed initial fusion; rewired  anterior neck" (02/20/2012)   REVERSE SHOULDER ARTHROPLASTY Right 02/16/2020   Procedure: REVERSE  SHOULDER ARTHROPLASTY;  Surgeon: Teryl Lucy, MD;  Location: [facility];  Service: Orthopedics;  Laterality: Right;   RIGHT/LEFT HEART CATH AND CORONARY ANGIOGRAPHY N/A 01/11/2021   Procedure: RIGHT/LEFT HEART CATH AND CORONARY ANGIOGRAPHY;  Surgeon: Marykay Lex, MD;  [facility] CV LAB;  Service: Cardiovascular;  Laterality: N/A;   SPINE SURGERY N/A    Phreesia 10/21/2019   TONSILLECTOMY  ~ 1953   VESICOVAGINAL FISTULA CLOSURE W/ TAH  1988   VIDEO BRONCHOSCOPY Bilateral 08/23/2016   Procedure: VIDEO BRONCHOSCOPY WITH FLUORO;  Surgeon: Roslynn Amble, MD;  Location: [facility];  Service: Cardiopulmonary;  Laterality: Bilateral;   VIDEO BRONCHOSCOPY N/A 04/05/2020   Procedure: VIDEO BRONCHOSCOPY WITHOUT FLUORO;  Surgeon: Martina Sinner, MD;  Location: [facility];  Service: Pulmonary;  Laterality: N/A;     reports that she has never smoked. She has been exposed to tobacco smoke. She has never used smokeless tobacco. She reports that she does not currently use alcohol. She reports that she does not use drugs.  Allergies  Allergen Reactions    Baclofen Anaphylaxis and Other (See Comments)    Altered mental status requiring a 3-day hospital stay- patient became unresponsive   "Put me in a coma for five days", Altered mental status requiring a 3-day hospital stay- patient became unresponsive   Other Shortness Of Breath and Other (See Comments)    Grass and weeds "sneezing; filled sinuses" (02/20/2012)   Pork-Derived Products Anaphylaxis   Shellfish Allergy Anaphylaxis, Shortness Of Breath, Itching and Rash   Shrimp Extract Anaphylaxis and Other (See Comments)    ALL SHELLFISH   Tetracycline Hcl Nausea And Vomiting   Xolair [Omalizumab] Other (See Comments)    Caused Blood clot   Zoledronic Acid Other (See Comments)    Reclast- Fever, Put in hospital, dr said it was a reaction from a reaction    Fever, inability to walk, Put in hospital  Fever, Put in hospital, dr said it was a reaction from a reaction , Other reaction(s): Unknown   Celecoxib Swelling    Feet swelling   Dilaudid [Hydromorphone Hcl] Itching   Hydrocodone-Acetaminophen Itching   Levofloxacin Other (See Comments)    GI upset   Molds & Smuts Cough   Morphine And Codeine Hives and Itching   Oxycodone Hcl Nausea And Vomiting    GI Intolerance   Paroxetine Nausea And Vomiting and Other (See Comments)    GI Intolerance also   Penicillins Rash and Other (See Comments)    "welts" (02/20/2012)  Tolerated Ancef 02/16/20   Tetracycline Hcl Other (See Comments)    GI Intolerance   Oxycodone-Acetaminophen Itching   Diltiazem Hcl Swelling   Dust Mite Extract Other (See Comments) and Cough    "sneezing" (02/20/2012) too   Gamunex [Immune Globulin] Itching and Rash    Tolerates hizentra    Penicillin G Procaine Rash and Other (See Comments)    Welts, also   Rituximab Other (See Comments)    Anemia    Rofecoxib Swelling    Vioxx- feet swelling   Tree Extract Itching    "tested and told I was allergic to it; never experienced a reaction to it" (02/20/2012)     Family History  Problem Relation Age of Onset   Allergies Mother    Heart disease Mother    Arthritis Mother    Lung cancer Mother        heavy smoker   Diabetes Mother    Allergies Father  Heart disease Father    Arthritis Father    Stroke Father    Heart disease Brother    Diabetes Maternal Grandfather    Colitis Daughter    Colon cancer Other        Maternal half uncle    Prior to Admission medications   Medication Sig Start Date End Date Taking? Authorizing Provider  acetaminophen (TYLENOL) 500 MG tablet Take 1,000 mg by mouth as needed for moderate pain.    [provider]  albuterol (PROVENTIL) (2.5 MG/3ML) 0.083% nebulizer solution Take 3 mLs (2.5 mg total) by nebulization every 4 (four) hours as needed for wheezing or shortness of breath. 01/01/23   Kozlow, [name], MD  albuterol (VENTOLIN HFA) 108 (90 Base) MCG/ACT inhaler Inhale 2 puffs into the lungs every 6 (six) hours as needed for wheezing or shortness of breath. 01/01/23   Kozlow, [name], MD  allopurinol (ZYLOPRIM) 100 MG tablet Take 1 tablet (100 mg total) by mouth daily. 03/04/23   [name], MD  Alpha-D-Galactosidase (BEANO PO) Take 2-3 tablets by mouth 3 (three) times daily as needed (Gas).    [provider]  Alpha-Lipoic Acid 600 MG TABS Take 600 mg by mouth in the morning.    [provider]  atorvastatin (LIPITOR) 80 MG tablet Take 1 tablet (80 mg total) by mouth every evening. 03/04/23   [name], MD  azelastine (ASTELIN) 0.1 % nasal spray Place 2 sprays into both nostrils daily as needed for rhinitis. Use in each nostril as directed 04/16/23   Kozlow, [name], MD  cetirizine (ZYRTEC) 10 MG tablet Take 1 tablet (10 mg total) by mouth daily as needed for allergies (Can take a n extra dose during flare ups.). 04/16/23   Kozlow, [name], MD  Cholecalciferol (VITAMIN D3) 25 MCG (1000 UT) capsule Take 1,000 Units by mouth at bedtime.    [provider]   clopidogrel (PLAVIX) 75 MG tablet TAKE 1 TABLET BY MOUTH EVERY DAY 05/07/23   Jodelle Red, MD  denosumab (PROLIA) 60 MG/ML SOSY injection Inject 60 mg into the skin every 6 (six) months.    [provider]  dexlansoprazole (DEXILANT) 60 MG capsule Take 1 capsule (60 mg total) by mouth in the morning. 04/16/23   Kozlow, [name], MD  diclofenac Sodium (VOLTAREN) 1 % GEL Apply 1 Application topically as needed (pain).    [provider]  empagliflozin (JARDIANCE) 10 MG TABS tablet Take 1 tablet (10 mg total) by mouth daily. 02/07/23   [name], MD  ENTRESTO 24-26 MG TAKE 1 TABLET BY MOUTH TWICE A DAY 05/07/23   Jodelle Red, MD  EPINEPHRINE 0.3 mg/0.3 mL IJ SOAJ injection USE AS DIRECTED BY YOUR PHYSICIAN INTRAMUSCULARLY AS NEEDED FOR ANAPHYLAXIS 10/09/22   Kozlow, [name], MD  famotidine (PEPCID) 20 MG tablet TAKE 1 TABLET (20 MG) BY MOUTH DAILY BEFORE LUNCH AND1 TABLET BY MOUTH EACH NIGHT AT BEDTIME 05/07/23   Kozlow, [name], MD  Fluticasone-Umeclidin-Vilant (TRELEGY ELLIPTA) 200-62.5-25 MCG/ACT AEPB INHALE 1 INHALATION INTO THE LUNGS IN THE MORNING. 04/16/23   Kozlow, [name], MD  folic acid (FOLVITE) 1 MG tablet Take 1 tablet (1 mg total) by mouth daily. 03/04/23   [name], MD  furosemide (LASIX) 40 MG tablet Take 40 mg by mouth 2 (two) times a week. Monday and Thursday 01/18/23   [provider]  gabapentin (NEURONTIN) 300 MG capsule Take 1 capsule (300 mg total) by mouth  at bedtime. 05/04/22   Lewie Chamber, MD  guaiFENesin (MUCINEX) 600 MG 12 hr tablet Take 600 mg by mouth 2 (two) times daily.    [provider]  HIZENTRA 10 GM/50ML SOLN Inject 15 g into the skin once a week. 13 g Infusion 01/14/23   Kozlow, [name], MD  Immune Globulin, Human, (HIZENTRA) 1 GM/5ML SOLN Inject 1 g into the skin. Patient not taking: Reported on 05/13/2023    [provider]  Immune Globulin, Human, (HIZENTRA) 10 GM/50ML SOSY INFUSE 13 GRAMS  SUBCUTANEOUSLY EVERY SEVEN DAYS 05/10/23   Kozlow, [name], MD  ipratropium-albuterol (DUONEB) 0.5-2.5 (3) MG/3ML SOLN Take 3 mLs by nebulization every 6 (six) hours as needed. 11/14/21   Martina Sinner, MD  isosorbide-hydrALAZINE (BIDIL) 20-37.5 MG tablet TAKE 1 TABLET BY MOUTH TWICE A DAY 05/07/23   Jodelle Red, MD  Lactase (LACTOSE FAST ACTING RELIEF PO) Take 2-3 tablets by mouth 3 (three) times daily before meals. For dairy food or drink    [provider]  magnesium oxide (MAG-OX) 400 (240 Mg) MG tablet Take 400 mg by mouth at bedtime.    [provider]  montelukast (SINGULAIR) 10 MG tablet Take 1 tablet (10 mg total) by mouth daily after supper. 03/04/23   [name], MD  mupirocin ointment (BACTROBAN) 2 % Apply 1 Application topically as needed (wounds). 01/29/23   Kozlow, [name], MD  nystatin (MYCOSTATIN) 100000 UNIT/ML suspension 5 mls swish, gargle, swallow after Trelegy use 05/14/23   Kozlow, [name], MD  potassium chloride (KLOR-CON) 10 MEQ tablet Take 10 mEq by mouth daily. 01/18/23   [provider]  predniSONE (DELTASONE) 5 MG tablet Take 1 tablet (5 mg total) by mouth daily with breakfast. 05/13/23   Rice, [name], MD  Prenatal Vit-Fe Fumarate-FA (PRENATAL VITAMINS PO) Take 1 tablet by mouth in the morning.    [provider]  Probiotic Product (ALIGN) 4 MG CAPS Take 4 mg by mouth every evening.    [provider]  Respiratory Therapy Supplies (NEBULIZER MASK ADULT) MISC 1 Device by Does not apply route as directed. 01/01/23   Kozlow, [name], MD  rivaroxaban (XARELTO) 20 MG TABS tablet Take 1 tablet (20 mg total) by mouth daily with supper. 03/04/23   [name], MD  traMADol (ULTRAM) 50 MG tablet Take 1 tablet (50 mg total) by mouth at bedtime. 02/19/23   [name], MD  traZODone (DESYREL) 50 MG tablet Take 2 tablets (100 mg total) by mouth at bedtime. Patient taking differently: Take 50 mg by mouth at  bedtime. 02/01/23   [name], MD    Physical Exam: Vitals:   05/18/23 1237 05/18/23 1430 05/18/23 1515 05/18/23 1520  BP:    (!) 125/52  Pulse:  85 78 75  Resp:  20 (!) 26 20  Temp: 100 F (37.8 C)     TempSrc: Rectal     SpO2:  95% 96% 97%    Physical Exam Vitals and nursing note reviewed.  Constitutional:      General: She is not in acute distress.    Appearance: She is not toxic-appearing or diaphoretic.     Comments: Chronically ill appearing  HENT:     Head: Normocephalic and atraumatic.     Nose: Nose normal.  Cardiovascular:     Rate and Rhythm: Normal rate and regular rhythm.  Pulmonary:     Effort: Pulmonary effort is normal.  Breath sounds: No wheezing.     Comments: Decreased BS in right base. Productive-sounding cough Abdominal:     General: Bowel sounds are normal. There is no distension.     Palpations: Abdomen is soft.     Tenderness: There is no abdominal tenderness.  Musculoskeletal:     Right lower leg: No edema.     Left lower leg: No edema.  Skin:    General: Skin is warm and dry.     Capillary Refill: Capillary refill takes less than 2 seconds.  Neurological:     Mental Status: She is alert and oriented to person, place, and time.      Labs on Admission: I have personally reviewed following labs and imaging studies  CBC: Recent Labs  Lab 05/18/23 1330  WBC 15.8*  NEUTROABS 13.0*  HGB 10.4*  HCT 33.1*  MCV 107.8*  PLT 135*   Basic Metabolic Panel: Recent Labs  Lab 05/18/23 1330  NA 139  K 3.7  CL 110  CO2 23  GLUCOSE 98  BUN 42*  CREATININE 1.55*  CALCIUM 8.3*   GFR: Estimated Creatinine Clearance: 25.2 mL/min (A) (by C-G formula based on SCr of 1.55 mg/dL (H)). Liver Function Tests: Recent Labs  Lab 05/18/23 1330  AST 40  ALT 55*  ALKPHOS 47  BILITOT 1.0  PROT 6.6  ALBUMIN 2.8*   Coagulation Profile: Recent Labs  Lab 05/18/23 1330  INR 1.8*   BNP (last 3 results) Recent Labs    03/31/23 0508  04/01/23 0428 05/18/23 1331  BNP 1,953.7* 1,916.4* 302.6*   Radiological Exams on Admission: I have personally reviewed images CT Chest Wo Contrast Result Date: 05/18/2023 CLINICAL DATA:  Pneumonia, complications suspected. Cough and fever since yesterday. Recent hospitalization for pneumonia. EXAM: CT CHEST WITHOUT CONTRAST TECHNIQUE: Multidetector CT imaging of the chest was performed following the standard protocol without IV contrast. RADIATION DOSE REDUCTION: This exam was performed according to the departmental dose-optimization program which includes automated exposure control, adjustment of the mA and/or kV according to patient size and/or use of iterative reconstruction technique. COMPARISON:  CT 03/29/2023.  Radiographs 05/18/2023 and 05/02/2023. FINDINGS: Cardiovascular: Diffuse atherosclerosis of the aorta, great vessels and coronary arteries. Left subclavian AICD lead projects to the right ventricular apex. Probable calcifications of the aortic valve. Stable mild cardiac enlargement. No significant pericardial fluid. Mediastinum/Nodes: There are no enlarged mediastinal, hilar or axillary lymph nodes.Mildly prominent subcarinal lymph node, likely reactive. The thyroid gland, trachea and esophagus demonstrate no significant findings. Lungs/Pleura: No pleural effusion or pneumothorax. Motion on the previous study limits comparison. There is progressively dense airspace disease within the superior segment of the right lower lobe there are new patchy airspace opacities within the right upper and middle lobes, suspicious for progressive pneumonia. Mild linear scarring or atelectasis in the left lower lobe. No evidence of left lung infiltrates. Upper abdomen: No significant findings are seen in the upper abdomen status post cholecystectomy. There is no adrenal mass. Aortic atherosclerosis noted. Musculoskeletal/Chest wall: There is no chest wall mass or suspicious osseous finding. Previous right  shoulder reverse arthroplasty and cervical fusion. 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 Postsurgical changes in the left breast. IMPRESSION: 1. Progressive airspace disease within the superior segment of the right lower lobe and new patchy airspace opacities within the right upper and middle lobes, suspicious for progressive multilobar pneumonia. It is difficult to tell radiographically whether this represents progression of the pneumonia seen on previous CT from  7 weeks ago or recurrent pneumonia. Correlate clinically. Radiographic follow up recommended. 2. No pleural effusion or pneumothorax. 3.  Aortic Atherosclerosis (ICD10-I70.0). Electronically Signed   By: Carey Bullocks M.D.   On: 05/18/2023 15:45   DG Chest Port 1 View Result Date: 05/18/2023 CLINICAL DATA:  Cough and fever since yesterday. EXAM: PORTABLE CHEST 1 VIEW COMPARISON:  May 02, 2023. FINDINGS: The heart size and mediastinal contours are within normal limits. Both lungs are clear. Stable single lead left-sided defibrillator. Status post right shoulder arthroplasty. Degenerative changes are seen involving the left shoulder. IMPRESSION: No active disease. Electronically Signed   By: Lupita Raider M.D.   On: 05/18/2023 12:55   EKG: My personal interpretation of EKG shows: NSR   Assessment/Plan Principal Problem:   RLL pneumonia Active Problems:   Sepsis without acute organ dysfunction (HCC)   Acute on chronic respiratory failure with hypoxia (HCC)   Current chronic use of systemic steroids - prednisone 7 mg daily for hx of RA   Chronic kidney disease, stage 3b (HCC) - baseline Scr 1.3-1.5   Chronic pain syndrome   Rheumatoid arthritis (HCC)   Tracheobronchomalacia   History of pulmonary embolus (PE)   DMII (diabetes mellitus, type 2) (HCC)   DNR (do not resuscitate)   Hypogammaglobulinemia (HCC)   Nocturnal hypoxia - 2-3 L/min  Assessment and Plan: * RLL  pneumonia 05-18-2023 RLL PNA seen on CT chest today. admit to med/surg bed. Continue with IV rocephin/zithromax. Hx of hypogammaglobulinemia. Pt has had 4 bouts of pneumonia in the last year. May need to consider outpatient ID consult to consider IVIG as outpatient. Check strep and legionella. Hx of RLL pneumonia. Pt states she occ has trouble with swallowing. Will have ST evaluate her for potential aspiration.  Acute on chronic respiratory failure with hypoxia (HCC) 05-18-2023 normally on O2 @ 2-3 L/min only at night. Now having to use continuous O2 due to RLL pneumonia.  Sepsis without acute organ dysfunction (HCC) 05-18-2023 present on admission. Acute hypoxia with RA sats 78%. WBC 15.8, RLL pneumonia in CT chest  Current chronic use of systemic steroids - prednisone 7 mg daily for hx of RA 05-18-2023 continue with prednisone 7 mg daily. If she becomes hypotensive, give 100 mg IV solucortef.  Nocturnal hypoxia - 2-3 L/min 05-18-2023 acutely worse due to RLL pneumonia. Continue continuous supplemental O2.  Hypogammaglobulinemia (HCC) 05-18-2023 given recurrent pneumonias, she may need to consider outpatient ID referral for outpatient IVIG.  DNR (do not resuscitate) 05-18-2023 chronic. Pt states her dtr [name] is aware of her wishes. Pt states dtr [name] is a Engineer, civil (consulting).  DMII (diabetes mellitus, type 2) (HCC) 05-18-2023 chronic. Stable. Add SSI. Continue jardiance  History of pulmonary embolus (PE) 05-18-2023 chronic. Stable. Continue Xarelto.  Tracheobronchomalacia 05-18-2023 chronic. Stable.  Rheumatoid arthritis (HCC) 05-18-2023 f/u with Dr. Dimple Casey with rheum. Continue with po prednisone.  Chronic pain syndrome 05-18-2023 continue ultram.  Chronic kidney disease, stage 3b (HCC) - baseline Scr 1.3-1.5 05-18-2023 baseline Scr 1.3-1.5. today Scr 1.55   DVT prophylaxis: Xarelto Code Status: DNR/DNI(Do NOT Intubate). Verified with pt at bedside. She states that her dtr renee is aware  of her decision Family Communication: no family at bedside  Disposition Plan: return home  Consults called: none  Admission status: Inpatient, Med-Surg   Carollee Herter, DO [facility] 05/18/2023, 3:55 PM

## 2023-05-18 NOTE — Assessment & Plan Note (Signed)
 05-18-2023 given recurrent pneumonias, she may need to consider outpatient ID referral for outpatient IVIG.  05-19-2023 pt states she gave herself an IVIG SQ injection within the last several weeks. She is not sure of the date.  05-20-2023 chronic. Stable.  05-21-2023 verified with pt's dtr who is a Char[employer] fundraiser and lives with pt. Dtr states pt missed her SQ IgG infusion. Will have pharmacy dose her IVIG and give today.

## 2023-05-18 NOTE — Assessment & Plan Note (Signed)
 05-18-2023 normally on O2 @ 2-3 L/min only at night. Now having to use continuous O2 due to RLL pneumonia.  05-19-2023 on 1 L/min now. Continue with supplemental O2 at night.  05-20-2023 stable on 2 L/min. Pt has electric concentrator at home. Has home generator in case of power outage.  05-21-2023 uses home O2 at night. Does not wear her CPAP at night per dtr.

## 2023-05-19 DIAGNOSIS — J9621 Acute and chronic respiratory failure with hypoxia: Secondary | ICD-10-CM | POA: Diagnosis not present

## 2023-05-19 DIAGNOSIS — A419 Sepsis, unspecified organism: Secondary | ICD-10-CM | POA: Diagnosis not present

## 2023-05-19 DIAGNOSIS — G894 Chronic pain syndrome: Secondary | ICD-10-CM

## 2023-05-19 DIAGNOSIS — Z7952 Long term (current) use of systemic steroids: Secondary | ICD-10-CM | POA: Diagnosis not present

## 2023-05-19 DIAGNOSIS — J189 Pneumonia, unspecified organism: Secondary | ICD-10-CM | POA: Diagnosis not present

## 2023-05-19 LAB — GLUCOSE, CAPILLARY
Glucose-Capillary: 71 mg/dL (ref 70–99)
Glucose-Capillary: 92 mg/dL (ref 70–99)
Glucose-Capillary: 129 mg/dL — ABNORMAL HIGH (ref 70–99)
Glucose-Capillary: 131 mg/dL — ABNORMAL HIGH (ref 70–99)

## 2023-05-19 LAB — CBC WITH DIFFERENTIAL/PLATELET
Abs Immature Granulocytes: 0.28 10*3/uL — ABNORMAL HIGH (ref 0.00–0.07)
Basophils Absolute: 0 10*3/uL (ref 0.0–0.1)
Basophils Relative: 0 %
Eosinophils Absolute: 0.2 10*3/uL (ref 0.0–0.5)
Eosinophils Relative: 1 %
HCT: 33.3 % — ABNORMAL LOW (ref 36.0–46.0)
Hemoglobin: 10.2 g/dL — ABNORMAL LOW (ref 12.0–15.0)
Immature Granulocytes: 2 %
Lymphocytes Relative: 8 %
Lymphs Abs: 1.3 10*3/uL (ref 0.7–4.0)
MCH: 34.2 pg — ABNORMAL HIGH (ref 26.0–34.0)
MCHC: 30.6 g/dL (ref 30.0–36.0)
MCV: 111.7 fL — ABNORMAL HIGH (ref 80.0–100.0)
Monocytes Absolute: 1.9 10*3/uL — ABNORMAL HIGH (ref 0.1–1.0)
Monocytes Relative: 12 %
Neutro Abs: 12.5 10*3/uL — ABNORMAL HIGH (ref 1.7–7.7)
Neutrophils Relative %: 77 %
Platelets: 121 10*3/uL — ABNORMAL LOW (ref 150–400)
RBC: 2.98 MIL/uL — ABNORMAL LOW (ref 3.87–5.11)
RDW: 18.8 % — ABNORMAL HIGH (ref 11.5–15.5)
WBC: 16.2 10*3/uL — ABNORMAL HIGH (ref 4.0–10.5)
nRBC: 0 % (ref 0.0–0.2)

## 2023-05-19 LAB — COMPREHENSIVE METABOLIC PANEL WITH GFR
ALT: 45 U/L — ABNORMAL HIGH (ref 0–44)
AST: 35 U/L (ref 15–41)
Albumin: 2.6 g/dL — ABNORMAL LOW (ref 3.5–5.0)
Alkaline Phosphatase: 51 U/L (ref 38–126)
Anion gap: 10 (ref 5–15)
BUN: 28 mg/dL — ABNORMAL HIGH (ref 8–23)
CO2: 19 mmol/L — ABNORMAL LOW (ref 22–32)
Calcium: 8.4 mg/dL — ABNORMAL LOW (ref 8.9–10.3)
Chloride: 109 mmol/L (ref 98–111)
Creatinine, Ser: 1.38 mg/dL — ABNORMAL HIGH (ref 0.44–1.00)
GFR, Estimated: 40 mL/min — ABNORMAL LOW (ref >60)
Glucose, Bld: 79 mg/dL (ref 70–99)
Potassium: 4.2 mmol/L (ref 3.5–5.1)
Sodium: 138 mmol/L (ref 135–145)
Total Bilirubin: 1.1 mg/dL (ref 0.0–1.2)
Total Protein: 6.3 g/dL — ABNORMAL LOW (ref 6.5–8.1)

## 2023-05-19 LAB — URINALYSIS, W/ REFLEX TO CULTURE (INFECTION SUSPECTED)
Bilirubin Urine: NEGATIVE
Glucose, UA: 150 mg/dL — AB
Ketones, ur: 5 mg/dL — AB
Leukocytes,Ua: NEGATIVE
Nitrite: NEGATIVE
Protein, ur: 100 mg/dL — AB
Specific Gravity, Urine: 1.017 (ref 1.005–1.030)
pH: 7 (ref 5.0–8.0)

## 2023-05-19 LAB — STREP PNEUMONIAE URINARY ANTIGEN: Strep Pneumo Urinary Antigen: NEGATIVE

## 2023-05-19 MED ORDER — ACETAMINOPHEN 650 MG RE SUPP: 650.0000 mg | Freq: Four times a day (QID) | RECTAL | Status: DC | PRN

## 2023-05-19 MED ORDER — METHYLPREDNISOLONE SODIUM SUCC 40 MG IJ SOLR
40.0000 mg | Freq: Once | INTRAMUSCULAR | Status: AC
  Administered 2023-05-19: 40 mg via INTRAVENOUS
  Filled 2023-05-19: qty 1

## 2023-05-19 MED ORDER — ACETAMINOPHEN 500 MG PO TABS
1000.0000 mg | ORAL_TABLET | Freq: Four times a day (QID) | ORAL | Status: DC | PRN
  Administered 2023-05-21: 1000 mg via ORAL
  Filled 2023-05-19: qty 2

## 2023-05-19 MED ORDER — TRAMADOL HCL 50 MG PO TABS
50.0000 mg | ORAL_TABLET | Freq: Every evening | ORAL | Status: DC | PRN
  Administered 2023-05-19: 50 mg via ORAL
  Filled 2023-05-19: qty 1

## 2023-05-19 MED ORDER — GABAPENTIN 100 MG PO CAPS
300.0000 mg | ORAL_CAPSULE | Freq: Every evening | ORAL | Status: DC | PRN
  Administered 2023-05-23: 300 mg via ORAL
  Filled 2023-05-19: qty 3

## 2023-05-19 MED ORDER — PREDNISONE 20 MG PO TABS
20.0000 mg | ORAL_TABLET | Freq: Every day | ORAL | Status: DC
  Administered 2023-05-20 – 2023-05-24 (×5): 20 mg via ORAL
  Filled 2023-05-19 (×5): qty 1

## 2023-05-19 NOTE — Progress Notes (Signed)
 PROGRESS NOTE    HILDY NICHOLL  ZOX:[id number] DOB: 1950-10-28 DOA: 05/18/2023 PCP: [name], MD  Subjective: Febrile to 101 this AM. After some further questioning, pt is taking SQ IVIG injections at home. She thinks she may had given herself an injection within the last 2 weeks.  Pt had large gobs of mucus near her head. Presumably from coughing them up this AM. Remains on supplemental O2.   Hospital Course: HPI: 73 year old Caucasian female history of seropositive rheumatoid arthritis on chronic prednisone, chronic respiratory failure on oxygen only at night, chronic prednisone use with 7 mg of prednisone a day, prior history of hypogammaglobulinemia, CKD stage IIIb baseline creatinine 1.3-1.5, chronic pain syndrome, history of tracheobronchomalacia, prior history of PE on chronic anticoagulation presents to the ER today with a 1 day history of feeling poorly.  1 day history of fever.  She has had about 4 bouts of pneumonia in the last year.  She was discharged to home during mid February 2025 due to pneumonia.  She completed a 5-day course of Keflex at home.  She states that she started having a worsening cough yesterday.  She had a fever today.  She normally checks her oxygen level at home.  It read in the mid 70s today on room air.  Patient came to the ER for evaluation.  On arrival to the ER room air saturations of 70%.  Heart rate 94.  Blood pressure 109/49.  Initial labs lactic acid of 1.3 White count of 15.8, hemoglobin 10.4, platelets of 135  Sodium 139, potassium 3.7, bicarb 23, BUN of 42, creatinine 1.55, glucose 98 COVID-negative, influenza negative, RSV negative  BNP elevated at 302  VBG showed a pH of 7.4, pCO2 36  Initial chest x-ray showed no active disease.  [facility] consulted due to presumed pneumonia and acute on chronic hypoxic respiratory failure.  After further discussion with the EDP, requested that a order a CT chest without contrast.  This  was performed and did demonstrate a right lower lobe pneumonia and a right middle lobe pneumonia.    Assessment and Plan: * RLL pneumonia 05-18-2023 RLL PNA seen on CT chest today. admit to med/surg bed. Continue with IV rocephin/zithromax. Hx of hypogammaglobulinemia. Pt has had 4 bouts of pneumonia in the last year. May need to consider outpatient ID consult to consider IVIG as outpatient. Check strep and legionella. Hx of RLL pneumonia. Pt states she occ has trouble with swallowing. Will have ST evaluate her for potential aspiration.  05-19-2023 continue with rocephin/zithromax. Strep pneumo and legionella sent. Continue with QID nebs  Acute on chronic respiratory failure with hypoxia (HCC) 05-18-2023 normally on O2 @ 2-3 L/min only at night. Now having to use continuous O2 due to RLL pneumonia.  05-19-2023 on 1 L/min now. Continue with supplemental O2 at night.  Sepsis without acute organ dysfunction (HCC) 05-18-2023 present on admission. Acute hypoxia with RA sats 78%. WBC 15.8, RLL pneumonia in CT chest  Current chronic use of systemic steroids - prednisone 7 mg daily for hx of RA 05-18-2023 continue with prednisone 7 mg daily. If she becomes hypotensive, give 100 mg IV solucortef.  05-19-2023 pt is relatively adrenal insufficiency given her prolonged use of prednisone. Not hypotensive. Will give 40 gm IV solumedrol and increase her prednisone to 20 mg daily(on chronic 7 mg daily).  Nocturnal hypoxia - 2-3 L/min 05-18-2023 acutely worse due to RLL pneumonia. Continue continuous supplemental O2.  05-19-2023 continue with nocturnal supplemental O2.  Hypogammaglobulinemia (HCC) 05-18-2023 given recurrent pneumonias, she may need to consider outpatient ID referral for outpatient IVIG.  05-19-2023 pt states she gave herself an IVIG SQ injection within the last several weeks. She is not sure of the date.  DNR (do not resuscitate) 05-18-2023 chronic. Pt states her dtr [name] is aware  of her wishes. Pt states dtr [name] is a Engineer, civil (consulting).  DMII (diabetes mellitus, type 2) (HCC) 05-18-2023 chronic. Stable. Add SSI. Continue jardiance  05-19-2023 chronic. Stable. Continue SSI. May need to add lantus if increase dose of prednisone causes hyperglycemia.  History of pulmonary embolus (PE) 05-18-2023 chronic. Stable. Continue Xarelto.  05-19-2023 chronic. Stable. Continue xarelto.  Tracheobronchomalacia 05-18-2023 chronic. Stable.  05-19-2023 chronic. Stable.  Rheumatoid arthritis (HCC) 05-18-2023 f/u with Dr. Dimple Casey with rheum. Continue with po prednisone.  05-19-2023 chronically on 7 mg daily prednisone. Will increase to 20 mg daily due to acute illness.  Chronic pain syndrome 05-18-2023 continue ultram.  05-19-2023 due to excessive sleepiness, change ultram to po prn. As well as gabapentin to prn.  Chronic kidney disease, stage 3b (HCC) - baseline Scr 1.3-1.5 05-18-2023 baseline Scr 1.3-1.5. today Scr 1.55  05-19-2023 after IVF overnight. Scr 1.38. stop IVF today. Allow pt to orally hydrate.  DVT prophylaxis: SCDs Start: 05/18/23 1600 rivaroxaban (XARELTO) tablet 20 mg     Code Status: Limited: Do not attempt resuscitation (DNR) -DNR-LIMITED -Do Not Intubate/DNI  Family Communication: no family at bedside Disposition Plan: return home Reason for continuing need for hospitalization: remains on IV ABX, IV steroids, supplemental O2.  Objective: Vitals:   05/19/23 0314 05/19/23 0406 05/19/23 0507 05/19/23 0850  BP:   127/65   Pulse:   78   Resp:   16   Temp: (!) 101.3 F (38.5 C) (!) 101.1 F (38.4 C) 100.1 F (37.8 C)   TempSrc: Oral Oral Oral   SpO2:   96% 97%  Weight:      Height:        Intake/Output Summary (Last 24 hours) at 05/19/2023 0916 Last data filed at 05/19/2023 0600 Gross per 24 hour  Intake 687.93 ml  Output 200 ml  Net 487.93 ml   Filed Weights   05/18/23 1725  Weight: 58.5 kg    Examination:  Physical Exam Vitals and nursing  note reviewed.  Constitutional:      General: She is not in acute distress.    Appearance: She is ill-appearing. She is not toxic-appearing.     Comments: Drowsy but arousable  HENT:     Head: Normocephalic and atraumatic.  Cardiovascular:     Rate and Rhythm: Normal rate and regular rhythm.  Pulmonary:     Breath sounds: Rhonchi present.     Comments: 2-3 gobs of large mucus blobs near her mouth at head of bed. Presumably from her coughing up sputum Abdominal:     General: Bowel sounds are normal.     Palpations: Abdomen is soft.  Musculoskeletal:     Right lower leg: No edema.     Left lower leg: No edema.  Skin:    General: Skin is warm and dry.     Capillary Refill: Capillary refill takes less than 2 seconds.  Neurological:     Comments: Drowsy but arousable     Data Reviewed: I have personally reviewed following labs and imaging studies  CBC: Recent Labs  Lab 05/18/23 1330 05/19/23 0534  WBC 15.8* 16.2*  NEUTROABS 13.0* 12.5*  HGB 10.4* 10.2*  HCT 33.1* 33.3*  MCV 107.8* 111.7*  PLT 135* 121*   Basic Metabolic Panel: Recent Labs  Lab 05/18/23 1330 05/19/23 0534  NA 139 138  K 3.7 4.2  CL 110 109  CO2 23 19*  GLUCOSE 98 79  BUN 42* 28*  CREATININE 1.55* 1.38*  CALCIUM 8.3* 8.4*   GFR: Estimated Creatinine Clearance: 28.2 mL/min (A) (by C-G formula based on SCr of 1.38 mg/dL (H)). Liver Function Tests: Recent Labs  Lab 05/18/23 1330 05/19/23 0534  AST 40 35  ALT 55* 45*  ALKPHOS 47 51  BILITOT 1.0 1.1  PROT 6.6 6.3*  ALBUMIN 2.8* 2.6*   Coagulation Profile: Recent Labs  Lab 05/18/23 1330  INR 1.8*   BNP (last 3 results) Recent Labs    03/31/23 0508 04/01/23 0428 05/18/23 1331  BNP 1,953.7* 1,916.4* 302.6*   CBG: Recent Labs  Lab 05/18/23 1710 05/18/23 2137 05/19/23 0810  GLUCAP 94 91 71   Sepsis Labs: Recent Labs  Lab 05/18/23 1322  LATICACIDVEN 1.3    Recent Results (from the past 240 hours)  Resp panel by RT-PCR  (RSV, Flu A&B, Covid) Anterior Nasal Swab     Status: None   Collection Time: 05/18/23  1:05 PM   Specimen: Anterior Nasal Swab  Result Value Ref Range Status   SARS Coronavirus 2 by RT PCR NEGATIVE NEGATIVE Final    Comment: (NOTE) SARS-CoV-2 target nucleic acids are NOT DETECTED.  The SARS-CoV-2 RNA is generally detectable in upper respiratory specimens during the acute phase of infection. The lowest concentration of SARS-CoV-2 viral copies this assay can detect is 138 copies/mL. A negative result does not preclude SARS-Cov-2 infection and should not be used as the sole basis for treatment or other patient management decisions. A negative result may occur with  improper specimen collection/handling, submission of specimen other than nasopharyngeal swab, presence of viral mutation(s) within the areas targeted by this assay, and inadequate number of viral copies(<138 copies/mL). A negative result must be combined with clinical observations, patient history, and epidemiological information. The expected result is Negative.  Fact Sheet for Patients:  BloggerCourse.com  Fact Sheet for Healthcare Providers:  SeriousBroker.it  This test is no t yet approved or cleared by the [place] FDA and  has been authorized for detection and/or diagnosis of SARS-CoV-2 by FDA under an Emergency Use Authorization (EUA). This EUA will remain  in effect (meaning this test can be used) for the duration of the COVID-19 declaration under Section 564(b)(1) of the Act, 21 U.S.C.section 360bbb-3(b)(1), unless the authorization is terminated  or revoked sooner.       Influenza A by PCR NEGATIVE NEGATIVE Final   Influenza B by PCR NEGATIVE NEGATIVE Final    Comment: (NOTE) The Xpert Xpress SARS-CoV-2/FLU/RSV plus assay is intended as an aid in the diagnosis of influenza from Nasopharyngeal swab specimens and should not be used as a sole basis for  treatment. Nasal washings and aspirates are unacceptable for Xpert Xpress SARS-CoV-2/FLU/RSV testing.  Fact Sheet for Patients: BloggerCourse.com  Fact Sheet for Healthcare Providers: SeriousBroker.it  This test is not yet approved or cleared by the [place] FDA and has been authorized for detection and/or diagnosis of SARS-CoV-2 by FDA under an Emergency Use Authorization (EUA). This EUA will remain in effect (meaning this test can be used) for the duration of the COVID-19 declaration under Section 564(b)(1) of the Act, 21 U.S.C. section 360bbb-3(b)(1), unless the authorization is terminated or revoked.     Resp Syncytial Virus by PCR  NEGATIVE NEGATIVE Final    Comment: (NOTE) Fact Sheet for Patients: BloggerCourse.com  Fact Sheet for Healthcare Providers: SeriousBroker.it  This test is not yet approved or cleared by the [place] FDA and has been authorized for detection and/or diagnosis of SARS-CoV-2 by FDA under an Emergency Use Authorization (EUA). This EUA will remain in effect (meaning this test can be used) for the duration of the COVID-19 declaration under Section 564(b)(1) of the Act, 21 U.S.C. section 360bbb-3(b)(1), unless the authorization is terminated or revoked.  Performed at [facility], [address], [place], [place] [address]   Blood Culture (routine x 2)     Status: None (Preliminary result)   Collection Time: 05/18/23  1:12 PM   Specimen: BLOOD RIGHT ARM  Result Value Ref Range Status   Specimen Description   Final    BLOOD RIGHT ARM Performed at [facility], [address], [place], [place] [address]    Special Requests   Final    BOTTLES DRAWN AEROBIC AND ANAEROBIC Blood Culture adequate volume Performed at [facility], [address] [place], [place] [address]    Culture   Final    NO  GROWTH < 24 HOURS Performed at [facility], [address], [place], [place] [address]    Report Status PENDING  Incomplete  Blood Culture (routine x 2)     Status: None (Preliminary result)   Collection Time: 05/18/23  1:12 PM   Specimen: BLOOD RIGHT ARM  Result Value Ref Range Status   Specimen Description   Final    BLOOD RIGHT ARM Performed at [facility], [address] [place], [place] [address]    Special Requests   Final    BOTTLES DRAWN AEROBIC AND ANAEROBIC Blood Culture adequate volume Performed at [facility], [address], [place], [place] [address]    Culture   Final    NO GROWTH < 24 HOURS Performed at [facility], [address], [place], [place] [address]    Report Status PENDING  Incomplete     Radiology Studies: CT Chest Wo Contrast Result Date: 05/18/2023 CLINICAL DATA:  Pneumonia, complications suspected. Cough and fever since yesterday. Recent hospitalization for pneumonia. EXAM: CT CHEST WITHOUT CONTRAST TECHNIQUE: Multidetector CT imaging of the chest was performed following the standard protocol without IV contrast. RADIATION DOSE REDUCTION: This exam was performed according to the departmental dose-optimization program which includes automated exposure control, adjustment of the mA and/or kV according to patient size and/or use of iterative reconstruction technique. COMPARISON:  CT 03/29/2023.  Radiographs 05/18/2023 and 05/02/2023. FINDINGS: Cardiovascular: Diffuse atherosclerosis of the aorta, great vessels and coronary arteries. Left subclavian AICD lead projects to the right ventricular apex. Probable calcifications of the aortic valve. Stable mild cardiac enlargement. No significant pericardial fluid. Mediastinum/Nodes: There are no enlarged mediastinal, hilar or axillary lymph nodes.Mildly prominent subcarinal lymph node, likely reactive. The thyroid gland, trachea and esophagus demonstrate no significant findings.  Lungs/Pleura: No pleural effusion or pneumothorax. Motion on the previous study limits comparison. There is progressively dense airspace disease within the superior segment of the right lower lobe there are new patchy airspace opacities within the right upper and middle lobes, suspicious for progressive pneumonia. Mild linear scarring or atelectasis in the left lower lobe. No evidence of left lung infiltrates. Upper abdomen: No significant findings are seen in the upper abdomen status post cholecystectomy. There is no adrenal mass. Aortic atherosclerosis  noted. Musculoskeletal/Chest wall: There is no chest wall mass or suspicious osseous finding. Previous right shoulder reverse arthroplasty and cervical fusion. 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 Postsurgical changes in the left breast. IMPRESSION: 1. Progressive airspace disease within the superior segment of the right lower lobe and new patchy airspace opacities within the right upper and middle lobes, suspicious for progressive multilobar pneumonia. It is difficult to tell radiographically whether this represents progression of the pneumonia seen on previous CT from 7 weeks ago or recurrent pneumonia. Correlate clinically. Radiographic follow up recommended. 2. No pleural effusion or pneumothorax. 3.  Aortic Atherosclerosis (ICD10-I70.0). Electronically Signed   By: Carey Bullocks M.D.   On: 05/18/2023 15:45   DG Chest Port 1 View Result Date: 05/18/2023 CLINICAL DATA:  Cough and fever since yesterday. EXAM: PORTABLE CHEST 1 VIEW COMPARISON:  May 02, 2023. FINDINGS: The heart size and mediastinal contours are within normal limits. Both lungs are clear. Stable single lead left-sided defibrillator. Status post right shoulder arthroplasty. Degenerative changes are seen involving the left shoulder. IMPRESSION: No active disease. Electronically Signed   By: Lupita Raider M.D.   On: 05/18/2023 12:55     Scheduled Meds:  atorvastatin  80 mg Oral QPM   azithromycin  500 mg Oral Daily   clopidogrel  75 mg Oral Daily   docusate sodium  100 mg Oral BID   empagliflozin  10 mg Oral Daily   fluticasone furoate-vilanterol  1 puff Inhalation Daily   And   umeclidinium bromide  1 puff Inhalation Daily   [START ON 05/20/2023] furosemide  40 mg Oral Once per day on Monday Wednesday   insulin aspart  0-5 Units Subcutaneous QHS   insulin aspart  0-9 Units Subcutaneous TID WC   ipratropium-albuterol  3 mL Nebulization QID   isosorbide-hydrALAZINE  1 tablet Oral BID   methylPREDNISolone (SOLU-MEDROL) injection  40 mg Intravenous Once   montelukast  10 mg Oral QPC supper   [START ON 05/20/2023] predniSONE  20 mg Oral Q breakfast   rivaroxaban  20 mg Oral Q supper   sacubitril-valsartan  1 tablet Oral BID   Continuous Infusions:  cefTRIAXone (ROCEPHIN)  IV       LOS: 1 day   Time spent: 45 minutes  Carollee Herter, DO  [facility]  05/19/2023, 9:16 AM

## 2023-05-19 NOTE — Plan of Care (Signed)
   Problem: Education: Goal: Knowledge of General Education information will improve Description: Including pain rating scale, medication(s)/side effects and non-pharmacologic comfort measures Outcome: Progressing   Problem: Activity: Goal: Risk for activity intolerance will decrease Outcome: Progressing   Problem: Coping: Goal: Level of anxiety will decrease Outcome: Progressing   Problem: Pain Managment: Goal: General experience of comfort will improve and/or be controlled Outcome: Progressing

## 2023-05-19 NOTE — Progress Notes (Signed)
   Called and gave pt's son Keyana Guevara update on pt's condition. Questions answered. Confirmed that pt get Hizentra SQ IVIG infusions on a weekly basis at home.  Carollee Herter, DO [facility]

## 2023-05-19 NOTE — Progress Notes (Signed)
 2200 medications not given. Patient is very drowsy. Arousable when talked to, answers questions appropriately then fall back to sleep. Incontinent x1 of urine in bed. Oxygen saturation 90% on room air, applied 1L oxygen via nasal cannula and saturation rose to 95%. MD on call made aware.  Plan of care ongoing.

## 2023-05-19 NOTE — Subjective & Objective (Signed)
 Pt seen and examined. Dtr [name] at bedside. 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  Pt febrile to 101 this AM. Verified with dtr that pt missed her IVIG SQ infusion last week.  Strep pneumo and legionella antigen are negative.  Pt needs to be afebrile for 24 hours before she is stable for DC.  Remains on IV rocephin/zithromax. Blood cx remain negative.  Pt has chest percussion vest at home. Does not wear/use it per dtr.  Pt also has CPAP at home that she does not wear per dtr.

## 2023-05-20 DIAGNOSIS — A419 Sepsis, unspecified organism: Secondary | ICD-10-CM | POA: Diagnosis not present

## 2023-05-20 DIAGNOSIS — J9621 Acute and chronic respiratory failure with hypoxia: Secondary | ICD-10-CM | POA: Diagnosis not present

## 2023-05-20 DIAGNOSIS — J189 Pneumonia, unspecified organism: Secondary | ICD-10-CM | POA: Diagnosis not present

## 2023-05-20 DIAGNOSIS — Z7952 Long term (current) use of systemic steroids: Secondary | ICD-10-CM | POA: Diagnosis not present

## 2023-05-20 DIAGNOSIS — E8721 Acute metabolic acidosis: Secondary | ICD-10-CM

## 2023-05-20 LAB — GLUCOSE, CAPILLARY
Glucose-Capillary: 139 mg/dL — ABNORMAL HIGH (ref 70–99)
Glucose-Capillary: 193 mg/dL — ABNORMAL HIGH (ref 70–99)
Glucose-Capillary: 201 mg/dL — ABNORMAL HIGH (ref 70–99)
Glucose-Capillary: 142 mg/dL — ABNORMAL HIGH (ref 70–99)

## 2023-05-20 LAB — CBC WITH DIFFERENTIAL/PLATELET
Abs Immature Granulocytes: 0.17 10*3/uL — ABNORMAL HIGH (ref 0.00–0.07)
Basophils Absolute: 0 10*3/uL (ref 0.0–0.1)
Basophils Relative: 0 %
Eosinophils Absolute: 0 10*3/uL (ref 0.0–0.5)
Eosinophils Relative: 0 %
HCT: 31.4 % — ABNORMAL LOW (ref 36.0–46.0)
Hemoglobin: 10.1 g/dL — ABNORMAL LOW (ref 12.0–15.0)
Immature Granulocytes: 1 %
Lymphocytes Relative: 4 %
Lymphs Abs: 0.4 10*3/uL — ABNORMAL LOW (ref 0.7–4.0)
MCH: 34.1 pg — ABNORMAL HIGH (ref 26.0–34.0)
MCHC: 32.2 g/dL (ref 30.0–36.0)
MCV: 106.1 fL — ABNORMAL HIGH (ref 80.0–100.0)
Monocytes Absolute: 1.1 10*3/uL — ABNORMAL HIGH (ref 0.1–1.0)
Monocytes Relative: 9 %
Neutro Abs: 10.5 10*3/uL — ABNORMAL HIGH (ref 1.7–7.7)
Neutrophils Relative %: 86 %
Platelets: 140 10*3/uL — ABNORMAL LOW (ref 150–400)
RBC: 2.96 MIL/uL — ABNORMAL LOW (ref 3.87–5.11)
RDW: 18 % — ABNORMAL HIGH (ref 11.5–15.5)
WBC: 12.2 10*3/uL — ABNORMAL HIGH (ref 4.0–10.5)
nRBC: 0 % (ref 0.0–0.2)

## 2023-05-20 LAB — BASIC METABOLIC PANEL WITH GFR
Anion gap: 11 (ref 5–15)
BUN: 31 mg/dL — ABNORMAL HIGH (ref 8–23)
CO2: 18 mmol/L — ABNORMAL LOW (ref 22–32)
Calcium: 8.6 mg/dL — ABNORMAL LOW (ref 8.9–10.3)
Chloride: 109 mmol/L (ref 98–111)
Creatinine, Ser: 1.22 mg/dL — ABNORMAL HIGH (ref 0.44–1.00)
GFR, Estimated: 46 mL/min — ABNORMAL LOW (ref >60)
Glucose, Bld: 161 mg/dL — ABNORMAL HIGH (ref 70–99)
Potassium: 4 mmol/L (ref 3.5–5.1)
Sodium: 138 mmol/L (ref 135–145)

## 2023-05-20 LAB — LEGIONELLA PNEUMOPHILA SEROGP 1 UR AG: L. pneumophila Serogp 1 Ur Ag: NEGATIVE

## 2023-05-20 MED ORDER — TRIAMCINOLONE ACETONIDE 40 MG/ML IJ SUSP
40.0000 mg | INTRAMUSCULAR | Status: DC | PRN
  Administered 2023-05-13: 40 mg via INTRA_ARTICULAR

## 2023-05-20 MED ORDER — IPRATROPIUM-ALBUTEROL 0.5-2.5 (3) MG/3ML IN SOLN
3.0000 mL | Freq: Two times a day (BID) | RESPIRATORY_TRACT | Status: DC
  Administered 2023-05-20 – 2023-05-24 (×8): 3 mL via RESPIRATORY_TRACT
  Filled 2023-05-20 (×8): qty 3

## 2023-05-20 MED ORDER — FUROSEMIDE 40 MG PO TABS
40.0000 mg | ORAL_TABLET | ORAL | Status: DC
  Filled 2023-05-20: qty 1

## 2023-05-20 MED ORDER — LIDOCAINE HCL 1 % IJ SOLN
2.0000 mL | INTRAMUSCULAR | Status: DC | PRN
Start: 2023-05-13 — End: 2023-05-13
  Administered 2023-05-13: 2 mL

## 2023-05-20 MED ORDER — ALUM & MAG HYDROXIDE-SIMETH 200-200-20 MG/5ML PO SUSP
30.0000 mL | Freq: Four times a day (QID) | ORAL | Status: DC | PRN
  Administered 2023-05-20: 30 mL via ORAL
  Filled 2023-05-20: qty 30

## 2023-05-20 NOTE — Assessment & Plan Note (Signed)
 05-20-2023 due to poor po intake and pneumonia.  05-21-2023 has CKD. Add po bicarb.

## 2023-05-20 NOTE — TOC Initial Note (Signed)
 Transition of Care [facility]) - Initial/Assessment Note    Patient Details  Name: Ebony Scott MRN: [id number] Date of Birth: 15-Aug-1950  Transition of Care [facility]) CM/SW Contact:    Adrian Prows, RN Phone Number: 05/20/2023, 12:40 PM  Clinical Narrative:                 Sherron Monday w/ pt, son Molly Maduro [phone]) and family in room; pt says she shares a home w/ her dtr; she plans to return at d/c; pt says family will provide transportation; she verified insurance/PCP; pt denies SDOH risks; she has a rolator, and home oxygen; pt says she uses oxygen at night only, but she does not know what agency provides service; pt says she does not received HH services; awaiting PT eval; TOC will follow.  Expected Discharge Plan: Home/Self Care Barriers to Discharge: Continued Medical Work up   Patient Goals and CMS Choice Patient states their goals for this hospitalization and ongoing recovery are:: home CMS Medicare.gov Compare Post Acute Care list provided to:: Patient   [place] ownership interest in [facility].provided to:: Patient    Expected Discharge Plan and Services   Discharge Planning Services: CM Consult   Living arrangements for the past 2 months: Single Family Home                                      Prior Living Arrangements/Services Living arrangements for the past 2 months: Single Family Home Lives with:: Adult Children Patient language and need for interpreter reviewed:: Yes Do you feel safe going back to the place where you live?: Yes      Need for Family Participation in Patient Care: Yes (Comment) Care giver support system in place?: Yes (comment) Current home services: DME (rolator; home oxygen) Criminal Activity/Legal Involvement Pertinent to Current Situation/Hospitalization: No - Comment as needed  Activities of Daily Living   ADL Screening (condition at time of admission) Independently performs ADLs?: Yes (appropriate for developmental  age) Is the patient deaf or have difficulty hearing?: No Does the patient have difficulty seeing, even when wearing glasses/contacts?: No Does the patient have difficulty concentrating, remembering, or making decisions?: No  Permission Sought/Granted Permission sought to share information with : Case Manager Permission granted to share information with : Yes, Verbal Permission Granted  Share Information with NAME: Case Manager     Permission granted to share info w Relationship: Jessalynn Mccowan (son) [phone]     Emotional Assessment Appearance:: Appears stated age Attitude/Demeanor/Rapport: Gracious Affect (typically observed): Accepting Orientation: : Oriented to Self, Oriented to Place, Oriented to  Time, Oriented to Situation Alcohol / Substance Use: Not Applicable Psych Involvement: No (comment)  Admission diagnosis:  Acute respiratory failure with hypoxia (HCC) [J96.01] RLL pneumonia [J18.9] Fever, unspecified fever cause [R50.9] Patient Active Problem List   Diagnosis Date Noted   Acute metabolic acidosis 05/20/2023   RLL pneumonia 05/18/2023   Nocturnal hypoxia - 2-3 L/min 05/18/2023   Acute on chronic respiratory failure with hypoxia (HCC) 05/18/2023   Current chronic use of systemic steroids - prednisone 7 mg daily for hx of RA 05/18/2023   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   Sepsis without acute organ dysfunction (HCC) 11/30/2022   DNR (do not resuscitate) 08/31/2022   Noninfectious otitis externa of left ear 06/08/2021   Iron deficiency anemia 02/24/2021   GAVE (gastric antral vascular ectasia)    Anticoagulated  Antiplatelet or antithrombotic long-term use    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   Coronary artery calcification seen on CAT scan 01/11/2021   History of DVT (deep vein thrombosis) 09/24/2020   DMII (diabetes mellitus, type 2) (HCC) 09/23/2020   Hoarseness 09/09/2020   S/P reverse total shoulder arthroplasty, right    DOE  (dyspnea on exertion) 02/18/2020   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   S/P reverse total shoulder arthroplasty, left 02/16/2020   History of pulmonary embolus (PE) 12/19/2018   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   Rheumatoid arthritis (HCC)    Bilateral impacted cerumen 08/18/2018   Hypothyroidism 01/14/2018   Depression 01/14/2018   Debility    Protein-calorie malnutrition, moderate (HCC) 12/17/2017   Knee pain    History of breast cancer    Chronic pain syndrome    Fibromyalgia    Graves disease    Dysphonia 08/10/2017   Pulmonary nodules    Chronic kidney disease, stage 3b (HCC) - baseline Scr 1.3-1.5 04/15/2016   Hx of adenomatous colonic polyps 04/12/2016   Opacity of lung on imaging study 11/03/2015   Severe persistent asthma 02/08/2015   Facial pain syndrome 02/08/2015   Insomnia 02/08/2015   Other allergic rhinitis 02/08/2015   LPRD (laryngopharyngeal reflux disease) 02/08/2015   Chronic systolic CHF (congestive heart failure) (HCC) 02/08/2015   Diastolic dysfunction 02/08/2015   Benign paroxysmal positional vertigo 12/03/2012   Disequilibrium 10/29/2012   Pseudogout 02/24/2012   Irritable bowel syndrome 01/02/2010   Hyperlipidemia 12/23/2009   Hyperthyroidism 11/12/2006   Seasonal and perennial allergic rhinitis 11/12/2006   GERD 11/12/2006   NECK PAIN, CHRONIC 11/12/2006   OSTEOPOROSIS 11/12/2006   BREAST CANCER, HX OF 11/12/2006   PCP:  [name], MD Pharmacy:   [id number] - [place], [place] - [address] [place] [address] Phone: [phone] Fax: [phone]     Social Drivers of Health (SDOH) Social History: SDOH Screenings   Food Insecurity: No Food Insecurity (05/20/2023)  Housing: Low Risk  (05/20/2023)  Transportation Needs: No Transportation Needs (05/20/2023)  Utilities: Not At Risk (05/20/2023)  Alcohol Screen: Low Risk  (11/22/2022)  Depression (PHQ2-9): Low Risk  (01/21/2023)  Financial  Resource Strain: Low Risk  (03/26/2023)  Physical Activity: Inactive (03/26/2023)  Social Connections: Socially Isolated (05/18/2023)  Stress: Stress Concern Present (03/26/2023)  Tobacco Use: Low Risk  (05/18/2023)  Health Literacy: Adequate Health Literacy (11/22/2022)   SDOH Interventions: Food Insecurity Interventions: Intervention Not Indicated, Inpatient TOC Housing Interventions: Intervention Not Indicated, Inpatient TOC Transportation Interventions: Intervention Not Indicated, Inpatient TOC Utilities Interventions: Intervention Not Indicated, Inpatient TOC   Readmission Risk Interventions    05/20/2023   12:38 PM 12/06/2022    2:19 PM 12/03/2022    3:04 PM  Readmission Risk Prevention Plan  Transportation Screening Complete Complete Complete  PCP or Specialist Appt within 3-5 Days  Complete Complete  HRI or Home Care Consult  Complete Complete  Social Work Consult for Recovery Care Planning/Counseling  Complete Complete  Palliative Care Screening  Complete Complete  Medication Review Oceanographer) Complete Complete Complete  PCP or Specialist appointment within 3-5 days of discharge Complete    HRI or Home Care Consult Complete    SW Recovery Care/Counseling Consult Complete    Palliative Care Screening Not Applicable    Skilled Nursing Facility Complete

## 2023-05-20 NOTE — Plan of Care (Signed)
   Problem: Education: Goal: Knowledge of General Education information will improve Description Including pain rating scale, medication(s)/side effects and non-pharmacologic comfort measures Outcome: Progressing   Problem: Health Behavior/Discharge Planning: Goal: Ability to manage health-related needs will improve Outcome: Progressing

## 2023-05-20 NOTE — Plan of Care (Signed)
   Problem: Education: Goal: Knowledge of General Education information will improve Description: Including pain rating scale, medication(s)/side effects and non-pharmacologic comfort measures Outcome: Progressing   Problem: Coping: Goal: Level of anxiety will decrease Outcome: Progressing

## 2023-05-20 NOTE — Assessment & Plan Note (Signed)
 05-20-2023 stable. Not exacerbated  05-21-2023 on twice a week lasix. No LE edema.

## 2023-05-20 NOTE — Progress Notes (Signed)
 Patient eating dinner at this time. Will start CPT at later time.

## 2023-05-20 NOTE — Progress Notes (Addendum)
 PROGRESS NOTE    LAKEN ROG  BJY:[id number] DOB: 1950-12-07 DOA: 05/18/2023 PCP: [name], MD  Subjective: Pt seen and examined. Pt looks better today. Remains on O2. Pt states she only walks about 20-30 feet at home on a regular basis. Uses walker at home.   Hospital Course: HPI: 73 year old Caucasian female history of seropositive rheumatoid arthritis on chronic prednisone, chronic respiratory failure on oxygen only at night, chronic prednisone use with 7 mg of prednisone a day, prior history of hypogammaglobulinemia, CKD stage IIIb baseline creatinine 1.3-1.5, chronic pain syndrome, history of tracheobronchomalacia, prior history of PE on chronic anticoagulation presents to the ER today with a 1 day history of feeling poorly.  1 day history of fever.  She has had about 4 bouts of pneumonia in the last year.  She was discharged to home during mid February 2025 due to pneumonia.  She completed a 5-day course of Keflex at home.  She states that she started having a worsening cough yesterday.  She had a fever today.  She normally checks her oxygen level at home.  It read in the mid 70s today on room air.  Patient came to the ER for evaluation.  On arrival to the ER room air saturations of 70%.  Heart rate 94.  Blood pressure 109/49.  Initial labs lactic acid of 1.3 White count of 15.8, hemoglobin 10.4, platelets of 135  Sodium 139, potassium 3.7, bicarb 23, BUN of 42, creatinine 1.55, glucose 98 COVID-negative, influenza negative, RSV negative  BNP elevated at 302  VBG showed a pH of 7.4, pCO2 36  Initial chest x-ray showed no active disease.  [facility] consulted due to presumed pneumonia and acute on chronic hypoxic respiratory failure.  After further discussion with the EDP, requested that a order a CT chest without contrast.  This was performed and did demonstrate a right lower lobe pneumonia and a right middle lobe pneumonia.    Assessment and Plan: * RLL  pneumonia 05-18-2023 RLL PNA seen on CT chest today. admit to med/surg bed. Continue with IV rocephin/zithromax. Hx of hypogammaglobulinemia. Pt has had 4 bouts of pneumonia in the last year. May need to consider outpatient ID consult to consider IVIG as outpatient. Check strep and legionella. Hx of RLL pneumonia. Pt states she occ has trouble with swallowing. Will have ST evaluate her for potential aspiration.  05-19-2023 continue with rocephin/zithromax. Strep pneumo and legionella sent. Continue with QID nebs  05-20-2023 WBC down to 12.2K. was 15.8 on admission.  Strep pneumo negative. Awaiting legionella. Continue rocephin/zithromax. Needs PT eval. Asked pt to spend more time in recliner today and not lying down in bed. Continue with prednisone 20 mg every day and QID nebs.  Acute on chronic respiratory failure with hypoxia (HCC) 05-18-2023 normally on O2 @ 2-3 L/min only at night. Now having to use continuous O2 due to RLL pneumonia.  05-19-2023 on 1 L/min now. Continue with supplemental O2 at night.  05-20-2023 stable on 2 L/min. Pt has electric concentrator at home. Has home generator in case of power outage.  Sepsis without acute organ dysfunction (HCC) 05-18-2023 present on admission. Acute hypoxia with RA sats 78%. WBC 15.8, RLL pneumonia in CT chest  Acute metabolic acidosis 05-20-2023 due to poor po intake and pneumonia.  Current chronic use of systemic steroids - prednisone 7 mg daily for hx of RA 05-18-2023 continue with prednisone 7 mg daily. If she becomes hypotensive, give 100 mg IV solucortef.  05-19-2023 pt  is relatively adrenal insufficiency given her prolonged use of prednisone. Not hypotensive. Will give 40 gm IV solumedrol and increase her prednisone to 20 mg daily(on chronic 7 mg daily).  05-20-2023 on prednisone 20 mg daily due to acute illness. S/p 40 mg IV solumedrol on 05-19-2023.  Nocturnal hypoxia - 2-3 L/min 05-18-2023 acutely worse due to RLL pneumonia.  Continue continuous supplemental O2.  05-19-2023 continue with nocturnal supplemental O2.  05-20-2023 chronic. Stable.  Hypogammaglobulinemia (HCC) 05-18-2023 given recurrent pneumonias, she may need to consider outpatient ID referral for outpatient IVIG.  05-19-2023 pt states she gave herself an IVIG SQ injection within the last several weeks. She is not sure of the date.  05-20-2023 chronic. Stable.  DNR (do not resuscitate) 05-18-2023 chronic. Pt states her dtr [name] is aware of her wishes. Pt states dtr [name] is a Engineer, civil (consulting).  DMII (diabetes mellitus, type 2) (HCC) 05-18-2023 chronic. Stable. Add SSI. Continue jardiance  05-19-2023 chronic. Stable. Continue SSI. May need to add lantus if increase dose of prednisone causes hyperglycemia.  05-20-2023 chronic. Stable.  History of pulmonary embolus (PE) 05-18-2023 chronic. Stable. Continue Xarelto.  05-19-2023 chronic. Stable. Continue xarelto.  05-20-2023 chronic. Stable.  Tracheobronchomalacia 05-18-2023 chronic. Stable.  05-19-2023 chronic. Stable.  05-20-2023 chronic. Stable.  Rheumatoid arthritis (HCC) 05-18-2023 f/u with Dr. Dimple Casey with rheum. Continue with po prednisone.  05-19-2023 chronically on 7 mg daily prednisone. Will increase to 20 mg daily due to acute illness.  05-20-2023 stable. On 20 mg prednisone due to acute illness.  Chronic pain syndrome 05-18-2023 continue ultram.  05-19-2023 due to excessive sleepiness, change ultram to po prn. As well as gabapentin to prn.  05-20-2023 stable. On prn gabapentin and ultram  Chronic kidney disease, stage 3b (HCC) - baseline Scr 1.3-1.5 05-18-2023 baseline Scr 1.3-1.5. today Scr 1.55  05-19-2023 after IVF overnight. Scr 1.38. stop IVF today. Allow pt to orally hydrate.  05-20-2023 Scr 1.22, BUN 31. Stable.  Chronic systolic CHF (congestive heart failure) (HCC) 05-20-2023 stable. Not exacerbated   DVT prophylaxis: SCDs Start: 05/18/23 1600 rivaroxaban  (XARELTO) tablet 20 mg     Code Status: Limited: Do not attempt resuscitation (DNR) -DNR-LIMITED -Do Not Intubate/DNI  Family Communication: attempted to call pt's dtr reene. No answer. [phone]. Spoke to pt's son Rob yesterday Disposition Plan: return home Reason for continuing need for hospitalization: remains on IV Abx. Awaiting PT assessment. Potential home tomorrow.  Objective: Vitals:   05/20/23 0158 05/20/23 0531 05/20/23 0758 05/20/23 0801  BP: 110/60 (!) 157/73    Pulse: 83 71    Resp: 16 15    Temp: 98.2 F (36.8 C) 98 F (36.7 C)    TempSrc: Oral Oral    SpO2: 96% 98% 96% 96%  Weight:      Height:        Intake/Output Summary (Last 24 hours) at 05/20/2023 1226 Last data filed at 05/20/2023 1014 Gross per 24 hour  Intake 570 ml  Output 300 ml  Net 270 ml   Filed Weights   05/18/23 1725  Weight: 58.5 kg    Examination:  Physical Exam Vitals and nursing note reviewed.  Constitutional:      General: She is not in acute distress.    Appearance: She is not toxic-appearing.     Comments: Looks like she is feeling better today. More awake and interactive today.  HENT:     Head: Normocephalic and atraumatic.     Nose: Nose normal.  Cardiovascular:     Rate  and Rhythm: Normal rate and regular rhythm.  Pulmonary:     Effort: Pulmonary effort is normal. No respiratory distress.     Breath sounds: Normal breath sounds. No wheezing.  Abdominal:     General: Bowel sounds are normal. There is no distension.     Palpations: Abdomen is soft.  Musculoskeletal:     Right lower leg: No edema.     Left lower leg: No edema.  Skin:    General: Skin is warm and dry.     Capillary Refill: Capillary refill takes less than 2 seconds.  Neurological:     Mental Status: She is oriented to person, place, and time.     Data Reviewed: I have personally reviewed following labs and imaging studies  CBC: Recent Labs  Lab 05/18/23 1330 05/19/23 0534 05/20/23 0429  WBC  15.8* 16.2* 12.2*  NEUTROABS 13.0* 12.5* 10.5*  HGB 10.4* 10.2* 10.1*  HCT 33.1* 33.3* 31.4*  MCV 107.8* 111.7* 106.1*  PLT 135* 121* 140*   Basic Metabolic Panel: Recent Labs  Lab 05/18/23 1330 05/19/23 0534 05/20/23 0429  NA 139 138 138  K 3.7 4.2 4.0  CL 110 109 109  CO2 23 19* 18*  GLUCOSE 98 79 161*  BUN 42* 28* 31*  CREATININE 1.55* 1.38* 1.22*  CALCIUM 8.3* 8.4* 8.6*   GFR: Estimated Creatinine Clearance: 31.9 mL/min (A) (by C-G formula based on SCr of 1.22 mg/dL (H)). Liver Function Tests: Recent Labs  Lab 05/18/23 1330 05/19/23 0534  AST 40 35  ALT 55* 45*  ALKPHOS 47 51  BILITOT 1.0 1.1  PROT 6.6 6.3*  ALBUMIN 2.8* 2.6*   Coagulation Profile: Recent Labs  Lab 05/18/23 1330  INR 1.8*   BNP (last 3 results) Recent Labs    03/31/23 0508 04/01/23 0428 05/18/23 1331  BNP 1,953.7* 1,916.4* 302.6*   CBG: Recent Labs  Lab 05/19/23 1151 05/19/23 1627 05/19/23 2019 05/20/23 0751 05/20/23 1140  GLUCAP 92 129* 131* 139* 193*   Sepsis Labs: Recent Labs  Lab 05/18/23 1322  LATICACIDVEN 1.3    Recent Results (from the past 240 hours)  Resp panel by RT-PCR (RSV, Flu A&B, Covid) Anterior Nasal Swab     Status: None   Collection Time: 05/18/23  1:05 PM   Specimen: Anterior Nasal Swab  Result Value Ref Range Status   SARS Coronavirus 2 by RT PCR NEGATIVE NEGATIVE Final    Comment: (NOTE) SARS-CoV-2 target nucleic acids are NOT DETECTED.  The SARS-CoV-2 RNA is generally detectable in upper respiratory specimens during the acute phase of infection. The lowest concentration of SARS-CoV-2 viral copies this assay can detect is 138 copies/mL. A negative result does not preclude SARS-Cov-2 infection and should not be used as the sole basis for treatment or other patient management decisions. A negative result may occur with  improper specimen collection/handling, submission of specimen other than nasopharyngeal swab, presence of viral mutation(s)  within the areas targeted by this assay, and inadequate number of viral copies(<138 copies/mL). A negative result must be combined with clinical observations, patient history, and epidemiological information. The expected result is Negative.  Fact Sheet for Patients:  BloggerCourse.com  Fact Sheet for Healthcare Providers:  SeriousBroker.it  This test is no t yet approved or cleared by the [place] FDA and  has been authorized for detection and/or diagnosis of SARS-CoV-2 by FDA under an Emergency Use Authorization (EUA). This EUA will remain  in effect (meaning this test can be used) for the duration  of the COVID-19 declaration under Section 564(b)(1) of the Act, 21 U.S.C.section 360bbb-3(b)(1), unless the authorization is terminated  or revoked sooner.       Influenza A by PCR NEGATIVE NEGATIVE Final   Influenza B by PCR NEGATIVE NEGATIVE Final    Comment: (NOTE) The Xpert Xpress SARS-CoV-2/FLU/RSV plus assay is intended as an aid in the diagnosis of influenza from Nasopharyngeal swab specimens and should not be used as a sole basis for treatment. Nasal washings and aspirates are unacceptable for Xpert Xpress SARS-CoV-2/FLU/RSV testing.  Fact Sheet for Patients: BloggerCourse.com  Fact Sheet for Healthcare Providers: SeriousBroker.it  This test is not yet approved or cleared by the [place] FDA and has been authorized for detection and/or diagnosis of SARS-CoV-2 by FDA under an Emergency Use Authorization (EUA). This EUA will remain in effect (meaning this test can be used) for the duration of the COVID-19 declaration under Section 564(b)(1) of the Act, 21 U.S.C. section 360bbb-3(b)(1), unless the authorization is terminated or revoked.     Resp Syncytial Virus by PCR NEGATIVE NEGATIVE Final    Comment: (NOTE) Fact Sheet for  Patients: BloggerCourse.com  Fact Sheet for Healthcare Providers: SeriousBroker.it  This test is not yet approved or cleared by the [place] FDA and has been authorized for detection and/or diagnosis of SARS-CoV-2 by FDA under an Emergency Use Authorization (EUA). This EUA will remain in effect (meaning this test can be used) for the duration of the COVID-19 declaration under Section 564(b)(1) of the Act, 21 U.S.C. section 360bbb-3(b)(1), unless the authorization is terminated or revoked.  Performed at [facility], [address] [place], [place] [address]   Blood Culture (routine x 2)     Status: None (Preliminary result)   Collection Time: 05/18/23  1:12 PM   Specimen: BLOOD RIGHT ARM  Result Value Ref Range Status   Specimen Description   Final    BLOOD RIGHT ARM Performed at [facility], [address], [place], [place] [address]    Special Requests   Final    BOTTLES DRAWN AEROBIC AND ANAEROBIC Blood Culture adequate volume Performed at [facility], [address], [place], [place] [address]    Culture   Final    NO GROWTH 2 DAYS Performed at [facility], [address], [place], [place] [address]    Report Status PENDING  Incomplete  Blood Culture (routine x 2)     Status: None (Preliminary result)   Collection Time: 05/18/23  1:12 PM   Specimen: BLOOD RIGHT ARM  Result Value Ref Range Status   Specimen Description   Final    BLOOD RIGHT ARM Performed at [facility], [address], [place], [place] [address]    Special Requests   Final    BOTTLES DRAWN AEROBIC AND ANAEROBIC Blood Culture adequate volume Performed at [facility], [address], [place], [place] [address]    Culture   Final    NO GROWTH 2 DAYS Performed at [facility], [address], [place], [place] [address]    Report Status PENDING  Incomplete      Radiology Studies: CT Chest Wo Contrast Result Date: 05/18/2023 CLINICAL DATA:  Pneumonia, complications suspected. Cough and fever since yesterday. Recent hospitalization for pneumonia. EXAM: CT CHEST WITHOUT CONTRAST TECHNIQUE: Multidetector CT imaging of the chest was performed following the standard protocol without IV contrast. RADIATION DOSE REDUCTION: This exam was performed according to  the departmental dose-optimization program which includes automated exposure control, adjustment of the mA and/or kV according to patient size and/or use of iterative reconstruction technique. COMPARISON:  CT 03/29/2023.  Radiographs 05/18/2023 and 05/02/2023. FINDINGS: Cardiovascular: Diffuse atherosclerosis of the aorta, great vessels and coronary arteries. Left subclavian AICD lead projects to the right ventricular apex. Probable calcifications of the aortic valve. Stable mild cardiac enlargement. No significant pericardial fluid. Mediastinum/Nodes: There are no enlarged mediastinal, hilar or axillary lymph nodes.Mildly prominent subcarinal lymph node, likely reactive. The thyroid gland, trachea and esophagus demonstrate no significant findings. Lungs/Pleura: No pleural effusion or pneumothorax. Motion on the previous study limits comparison. There is progressively dense airspace disease within the superior segment of the right lower lobe there are new patchy airspace opacities within the right upper and middle lobes, suspicious for progressive pneumonia. Mild linear scarring or atelectasis in the left lower lobe. No evidence of left lung infiltrates. Upper abdomen: No significant findings are seen in the upper abdomen status post cholecystectomy. There is no adrenal mass. Aortic atherosclerosis noted. Musculoskeletal/Chest wall: There is no chest wall mass or suspicious osseous finding. Previous right shoulder reverse arthroplasty and cervical fusion. 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 Postsurgical changes in the left breast. IMPRESSION: 1. Progressive airspace disease within the superior segment of the right lower lobe and new patchy airspace opacities within the right upper and middle lobes, suspicious for progressive multilobar pneumonia. It is difficult to tell radiographically whether this represents progression of the pneumonia seen on previous CT from 7 weeks ago or recurrent pneumonia. Correlate clinically. Radiographic follow up recommended. 2. No pleural effusion or pneumothorax. 3.  Aortic Atherosclerosis (ICD10-I70.0). Electronically Signed   By: Carey Bullocks M.D.   On: 05/18/2023 15:45   DG Chest Port 1 View Result Date: 05/18/2023 CLINICAL DATA:  Cough and fever since yesterday. EXAM: PORTABLE CHEST 1 VIEW COMPARISON:  May 02, 2023. FINDINGS: The heart size and mediastinal contours are within normal limits. Both lungs are clear. Stable single lead left-sided defibrillator. Status post right shoulder arthroplasty. Degenerative changes are seen involving the left shoulder. IMPRESSION: No active disease. Electronically Signed   By: Lupita Raider M.D.   On: 05/18/2023 12:55    Scheduled Meds:  atorvastatin  80 mg Oral QPM   azithromycin  500 mg Oral Daily   clopidogrel  75 mg Oral Daily   docusate sodium  100 mg Oral BID   empagliflozin  10 mg Oral Daily   fluticasone furoate-vilanterol  1 puff Inhalation Daily   And   umeclidinium bromide  1 puff Inhalation Daily   [START ON 05/22/2023] furosemide  40 mg Oral Once per day on Monday Wednesday   insulin aspart  0-5 Units Subcutaneous QHS   insulin aspart  0-9 Units Subcutaneous TID WC   ipratropium-albuterol  3 mL Nebulization BID   isosorbide-hydrALAZINE  1 tablet Oral BID   montelukast  10 mg Oral QPC supper   predniSONE  20 mg Oral Q breakfast   rivaroxaban  20 mg Oral Q supper   sacubitril-valsartan  1 tablet Oral BID   Continuous Infusions:  cefTRIAXone (ROCEPHIN)  IV 2 g  (05/20/23 1201)     LOS: 2 days   Time spent: 40 minutes  Carollee Herter, DO  [facility]  05/20/2023, 12:26 PM

## 2023-05-20 NOTE — Evaluation (Signed)
 Physical Therapy Evaluation Patient Details Name: Ebony Scott MRN: [id number] DOB: 12/31/1950 Today's Date: 05/20/2023  History of Present Illness  Patient is a 73 y.o.  female with history of immunosuppression (chronic prednisone for RA/weekly IVIG for hypogammaglobulinemia)-chronic HFrEF-s/p ICD, CAD, bronchiectasis/asthma with chronic hypoxic respiratory failure on nocturnal oxygen-admitted on 05/18/23 with fever, RLL< pneumonia, hypoxia, SOB  Clinical Impression    t admitted with above diagnosis.  Pt currently with functional limitations due to the deficits listed below (see PT Problem List). Pt will benefit from acute skilled PT to increase their independence and safety with mobility to allow discharge.       Patient is motivated to progress activity in order to improve to return home. Patient essentially alone as daughter works. Patient reports generally stays in her home, uses a rollator at baseline.  SPO2 95%, HR 81 with mobility  of stand and transfer to the recliner.        If plan is discharge home, recommend the following: Assistance with cooking/housework;Assist for transportation;A little help with bathing/dressing/bathroom   Can travel by private vehicle        Equipment Recommendations None recommended by PT  Recommendations for Other Services    ot   Functional Status Assessment Patient has had a recent decline in their functional status and demonstrates the ability to make significant improvements in function in a reasonable and predictable amount of time.     Precautions / Restrictions Precautions Precautions: Fall Precaution/Restrictions Comments: monitor O2- on O@ at night Restrictions Weight Bearing Restrictions Per Provider Order: No      Mobility  Bed Mobility Overal bed mobility: Modified Independent                  Transfers Overall transfer level: Needs assistance Equipment used: Rolling walker (2 wheels) Transfers: Sit to/from Stand,  Bed to chair/wheelchair/BSC Sit to Stand: Contact guard assist   Step pivot transfers: Contact guard assist            Ambulation/Gait                  Stairs            Wheelchair Mobility     Tilt Bed    Modified Rankin (Stroke Patients Only)       Balance Overall balance assessment: Mild deficits observed, not formally tested                                           Pertinent Vitals/Pain Pain Assessment Pain Assessment: Faces Pain Score: 7  Pain Location: perirectum Pain Descriptors / Indicators: Burning, Other (Comment) (barrier cream) Pain Intervention(s): Limited activity within patient's tolerance, Repositioned    Home Living Family/patient expects to be discharged to:: Private residence Living Arrangements: Children Available Help at Discharge: Family;Available PRN/intermittently Type of Home: House Home Access: Stairs to enter Entrance Stairs-Rails: Right;Left Entrance Stairs-Number of Steps: 6   Home Layout: Two level;Able to live on main level with bedroom/bathroom Home Equipment: Rollator (4 wheels) Additional Comments: pts daughter lives in the basement    Prior Function Prior Level of Function : Independent/Modified Independent             Mobility Comments: pt reports using rollator walker in the home and community distances ADLs Comments: independent     Extremity/Trunk Assessment   Upper Extremity Assessment Upper Extremity Assessment: Overall  WFL for tasks assessed    Lower Extremity Assessment Lower Extremity Assessment: Generalized weakness    Cervical / Trunk Assessment Cervical / Trunk Assessment: Kyphotic  Communication   Communication Communication: No apparent difficulties    Cognition Arousal: Alert Behavior During Therapy: WFL for tasks assessed/performed   PT - Cognitive impairments: No apparent impairments                         Following commands: Intact        Cueing       General Comments      Exercises     Assessment/Plan    PT Assessment Patient needs continued PT services  PT Problem List Decreased strength;Decreased activity tolerance;Decreased mobility;Pain;Decreased skin integrity       PT Treatment Interventions DME instruction;Therapeutic activities;Gait training;Functional mobility training;Therapeutic exercise;Patient/family education    PT Goals (Current goals can be found in the Care Plan section)  Acute Rehab PT Goals Patient Stated Goal: to be strong enough to take care of myself PT Goal Formulation: With patient Time For Goal Achievement: 06/03/23 Potential to Achieve Goals: Good    Frequency Min 2X/week     Co-evaluation               AM-PAC PT "6 Clicks" Mobility  Outcome Measure Help needed turning from your back to your side while in a flat bed without using bedrails?: None Help needed moving from lying on your back to sitting on the side of a flat bed without using bedrails?: None Help needed moving to and from a bed to a chair (including a wheelchair)?: A Little Help needed standing up from a chair using your arms (e.g., wheelchair or bedside chair)?: A Little Help needed to walk in hospital room?: A Lot Help needed climbing 3-5 steps with a railing? : A Lot 6 Click Score: 18    End of Session Equipment Utilized During Treatment: Oxygen Activity Tolerance: Patient limited by fatigue Patient left: in chair;with call bell/phone within reach;with bed alarm set Nurse Communication: Mobility status PT Visit Diagnosis: Unsteadiness on feet (R26.81);Muscle weakness (generalized) (M62.81);Difficulty in walking, not elsewhere classified (R26.2)    Time: 1610-9604 PT Time Calculation (min) (ACUTE ONLY): 18 min   Charges:   PT Evaluation $PT Eval Low Complexity: 1 Low   PT General Charges $$ ACUTE PT VISIT: 1 Visit         Blanchard Kelch PT Acute Rehabilitation Services Office  [phone]    Rada Hay 05/20/2023, 3:38 PM

## 2023-05-21 DIAGNOSIS — Z7952 Long term (current) use of systemic steroids: Secondary | ICD-10-CM | POA: Diagnosis not present

## 2023-05-21 DIAGNOSIS — D801 Nonfamilial hypogammaglobulinemia: Secondary | ICD-10-CM

## 2023-05-21 DIAGNOSIS — E8721 Acute metabolic acidosis: Secondary | ICD-10-CM

## 2023-05-21 DIAGNOSIS — J189 Pneumonia, unspecified organism: Secondary | ICD-10-CM | POA: Diagnosis not present

## 2023-05-21 DIAGNOSIS — J9621 Acute and chronic respiratory failure with hypoxia: Secondary | ICD-10-CM | POA: Diagnosis not present

## 2023-05-21 LAB — BASIC METABOLIC PANEL WITH GFR
Anion gap: 10 (ref 5–15)
BUN: 35 mg/dL — ABNORMAL HIGH (ref 8–23)
CO2: 18 mmol/L — ABNORMAL LOW (ref 22–32)
Calcium: 8.7 mg/dL — ABNORMAL LOW (ref 8.9–10.3)
Chloride: 110 mmol/L (ref 98–111)
Creatinine, Ser: 1.29 mg/dL — ABNORMAL HIGH (ref 0.44–1.00)
GFR, Estimated: 43 mL/min — ABNORMAL LOW (ref >60)
Glucose, Bld: 97 mg/dL (ref 70–99)
Potassium: 3.9 mmol/L (ref 3.5–5.1)
Sodium: 138 mmol/L (ref 135–145)

## 2023-05-21 LAB — CBC WITH DIFFERENTIAL/PLATELET
Abs Immature Granulocytes: 0.12 K/uL — ABNORMAL HIGH (ref 0.00–0.07)
Basophils Absolute: 0 K/uL (ref 0.0–0.1)
Basophils Relative: 0 %
Eosinophils Absolute: 0.1 K/uL (ref 0.0–0.5)
Eosinophils Relative: 1 %
HCT: 33.8 % — ABNORMAL LOW (ref 36.0–46.0)
Hemoglobin: 10.8 g/dL — ABNORMAL LOW (ref 12.0–15.0)
Immature Granulocytes: 1 %
Lymphocytes Relative: 5 %
Lymphs Abs: 0.8 K/uL (ref 0.7–4.0)
MCH: 33.2 pg (ref 26.0–34.0)
MCHC: 32 g/dL (ref 30.0–36.0)
MCV: 104 fL — ABNORMAL HIGH (ref 80.0–100.0)
Monocytes Absolute: 1.8 K/uL — ABNORMAL HIGH (ref 0.1–1.0)
Monocytes Relative: 12 %
Neutro Abs: 11.8 K/uL — ABNORMAL HIGH (ref 1.7–7.7)
Neutrophils Relative %: 81 %
Platelets: 169 K/uL (ref 150–400)
RBC: 3.25 MIL/uL — ABNORMAL LOW (ref 3.87–5.11)
RDW: 18.3 % — ABNORMAL HIGH (ref 11.5–15.5)
WBC: 14.7 K/uL — ABNORMAL HIGH (ref 4.0–10.5)
nRBC: 0 % (ref 0.0–0.2)

## 2023-05-21 LAB — GLUCOSE, CAPILLARY
Glucose-Capillary: 103 mg/dL — ABNORMAL HIGH (ref 70–99)
Glucose-Capillary: 165 mg/dL — ABNORMAL HIGH (ref 70–99)
Glucose-Capillary: 163 mg/dL — ABNORMAL HIGH (ref 70–99)
Glucose-Capillary: 114 mg/dL — ABNORMAL HIGH (ref 70–99)

## 2023-05-21 MED ORDER — BENZONATATE 100 MG PO CAPS
200.0000 mg | ORAL_CAPSULE | Freq: Two times a day (BID) | ORAL | Status: DC
  Administered 2023-05-21 – 2023-05-24 (×7): 200 mg via ORAL
  Filled 2023-05-21 (×7): qty 2

## 2023-05-21 MED ORDER — SACCHAROMYCES BOULARDII 250 MG PO CAPS
250.0000 mg | ORAL_CAPSULE | Freq: Two times a day (BID) | ORAL | Status: DC
  Administered 2023-05-21 – 2023-05-24 (×7): 250 mg via ORAL
  Filled 2023-05-21 (×8): qty 1

## 2023-05-21 MED ORDER — IMMUNE GLOBULIN (HUMAN) 10 GM/100ML IV SOLN
15.0000 g | Freq: Once | INTRAVENOUS | Status: AC
  Administered 2023-05-21: 15 g via INTRAVENOUS
  Filled 2023-05-21: qty 150

## 2023-05-21 MED ORDER — TRAMADOL HCL 50 MG PO TABS
50.0000 mg | ORAL_TABLET | Freq: Four times a day (QID) | ORAL | Status: DC | PRN
  Administered 2023-05-21 – 2023-05-23 (×4): 50 mg via ORAL
  Filled 2023-05-21 (×5): qty 1

## 2023-05-21 MED ORDER — SODIUM BICARBONATE 650 MG PO TABS
650.0000 mg | ORAL_TABLET | Freq: Three times a day (TID) | ORAL | Status: DC
  Administered 2023-05-21 – 2023-05-24 (×10): 650 mg via ORAL
  Filled 2023-05-21 (×10): qty 1

## 2023-05-21 NOTE — Plan of Care (Signed)
   Problem: Education: Goal: Knowledge of General Education information will improve Description Including pain rating scale, medication(s)/side effects and non-pharmacologic comfort measures Outcome: Progressing   Problem: Health Behavior/Discharge Planning: Goal: Ability to manage health-related needs will improve Outcome: Progressing

## 2023-05-21 NOTE — Progress Notes (Signed)
 Patient refused chest vest this morning due to feeling SHOB and tired.

## 2023-05-21 NOTE — Progress Notes (Signed)
 Patient refused CPT with the chest vest.

## 2023-05-21 NOTE — Progress Notes (Signed)
 Physical Therapy Treatment Patient Details Name: Ebony Scott MRN: [id number] DOB: December 21, 1950 Today's Date: 05/21/2023   History of Present Illness Patient is a 73 y.o.  female with history of immunosuppression (chronic prednisone for RA/weekly IVIG for hypogammaglobulinemia)-chronic HFrEF-s/p ICD, CAD, bronchiectasis/asthma with chronic hypoxic respiratory failure on nocturnal oxygen-admitted on 05/18/23 with fever, RLL< pneumonia, hypoxia, SOB    PT Comments  Pt reporting fatigue and "I just don't feel good today" upon therapist's arrival, agreeable to bed exercises. Pt tolerates exercises without complaints, SpO2 >92% on 2L O2, good muscle activation noted, needing therapeutic rest breaks between exercises. After exercises, pt reports improved mood and energy, requesting to transfer to recliner at bedside, CGA for stand pivot to recliner with HHA; declines amb due to fatigued with exercises and transfer. Will continue to progress in acute care as able and recommend HHPT with family support at d/c.   If plan is discharge home, recommend the following: Assistance with cooking/housework;Assist for transportation;A little help with bathing/dressing/bathroom;A little help with walking and/or transfers   Can travel by private vehicle        Equipment Recommendations  None recommended by PT    Recommendations for Other Services       Precautions / Restrictions Precautions Precautions: Fall Precaution/Restrictions Comments: monitor O2 Restrictions Weight Bearing Restrictions Per Provider Order: No     Mobility  Bed Mobility Overal bed mobility: Modified Independent             General bed mobility comments: use of bedrail    Transfers Overall transfer level: Needs assistance Equipment used: 1 person hand held assist Transfers: Sit to/from Stand, Bed to chair/wheelchair/BSC Sit to Stand: Contact guard assist   Step pivot transfers: Contact guard assist       General transfer  comment: CGA for STS from EOB and step pivot to recliner, pt declines RW, holds bedrail and therapist's hand to steady self wtih transfer    Ambulation/Gait                   Stairs             Wheelchair Mobility     Tilt Bed    Modified Rankin (Stroke Patients Only)       Balance Overall balance assessment: Mild deficits observed, not formally tested                                          Communication Communication Communication: No apparent difficulties  Cognition Arousal: Alert Behavior During Therapy: WFL for tasks assessed/performed   PT - Cognitive impairments: No apparent impairments                         Following commands: Intact      Cueing    Exercises General Exercises - Lower Extremity Ankle Circles/Pumps: AROM, Both, 10 reps, Supine Short Arc Quad: AROM, Both, 10 reps, Supine Heel Slides: AROM, Both, 10 reps, Supine Hip ABduction/ADduction: AROM, Both, 10 reps, Supine Straight Leg Raises: AROM, Both, 10 reps, Supine    General Comments General comments (skin integrity, edema, etc.): pt on 2L with SpO2 96-98%      Pertinent Vitals/Pain Pain Assessment Pain Assessment: Faces Pain Score: 5  Pain Location: bil feet Pain Descriptors / Indicators: Aching Pain Intervention(s): Limited activity within patient's tolerance, Monitored during session, Repositioned  Home Living                          Prior Function            PT Goals (current goals can now be found in the care plan section) Acute Rehab PT Goals Patient Stated Goal: to be strong enough to take care of myself PT Goal Formulation: With patient Time For Goal Achievement: 06/03/23 Potential to Achieve Goals: Good Progress towards PT goals: Progressing toward goals    Frequency    Min 2X/week      PT Plan      Co-evaluation              AM-PAC PT "6 Clicks" Mobility   Outcome Measure  Help needed  turning from your back to your side while in a flat bed without using bedrails?: None Help needed moving from lying on your back to sitting on the side of a flat bed without using bedrails?: None Help needed moving to and from a bed to a chair (including a wheelchair)?: A Little Help needed standing up from a chair using your arms (e.g., wheelchair or bedside chair)?: A Little Help needed to walk in hospital room?: A Lot Help needed climbing 3-5 steps with a railing? : A Lot 6 Click Score: 18    End of Session Equipment Utilized During Treatment: Oxygen Activity Tolerance: Patient tolerated treatment well Patient left: in chair;with call bell/phone within reach;with chair alarm set Nurse Communication: Mobility status PT Visit Diagnosis: Unsteadiness on feet (R26.81);Muscle weakness (generalized) (M62.81);Difficulty in walking, not elsewhere classified (R26.2)     Time: 2130-8657 PT Time Calculation (min) (ACUTE ONLY): 23 min  Charges:    $Therapeutic Exercise: 8-22 mins $Therapeutic Activity: 8-22 mins PT General Charges $$ ACUTE PT VISIT: 1 Visit                     Tori Cherene Dobbins PT, DPT 05/21/23, 1:44 PM

## 2023-05-21 NOTE — Progress Notes (Signed)
 Patient refused CPT at 1600. She said" It hurts and is too tight on her chest. She prefers the flutter valve and the IS instead.

## 2023-05-21 NOTE — Progress Notes (Signed)
 PROGRESS NOTE    Ebony Scott  ZOX:[id number] DOB: 1950-07-08 DOA: 05/18/2023 PCP: [name], MD  Subjective: Pt seen and examined. Dtr Ebony Scott at bedside. 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  Pt febrile to 101 this AM. Verified with dtr that pt missed her IVIG SQ infusion last week.  Strep pneumo and legionella antigen are negative.  Pt needs to be afebrile for 24 hours before she is stable for DC.  Remains on IV rocephin/zithromax. Blood cx remain negative.  Pt has chest percussion vest at home. Does not wear/use it per dtr.  Pt also has CPAP at home that she does not wear per dtr.   Hospital Course: HPI: 73 year old Caucasian female history of seropositive rheumatoid arthritis on chronic prednisone, chronic respiratory failure on oxygen only at night, chronic prednisone use with 7 mg of prednisone a day, prior history of hypogammaglobulinemia, CKD stage IIIb baseline creatinine 1.3-1.5, chronic pain syndrome, history of tracheobronchomalacia, prior history of PE on chronic anticoagulation presents to the ER today with a 1 day history of feeling poorly.  1 day history of fever.  She has had about 4 bouts of pneumonia in the last year.  She was discharged to home during mid February 2025 due to pneumonia.  She completed a 5-day course of Keflex at home.  She states that she started having a worsening cough yesterday.  She had a fever today.  She normally checks her oxygen level at home.  It read in the mid 70s today on room air.  Patient came to the ER for evaluation.  On arrival to the ER room air saturations of 70%.  Heart rate 94.  Blood pressure 109/49.  Initial labs lactic acid of 1.3 White count of 15.8, hemoglobin 10.4, platelets of 135  Sodium 139, potassium 3.7, bicarb 23, BUN of 42, creatinine 1.55, glucose 98 COVID-negative, influenza negative, RSV negative  BNP elevated at 302  VBG showed a pH of 7.4, pCO2 36  Initial chest x-ray showed no active  disease.  [facility] consulted due to presumed pneumonia and acute on chronic hypoxic respiratory failure.  After further discussion with the EDP, requested that a order a CT chest without contrast.  This was performed and did demonstrate a right lower lobe pneumonia and a right middle lobe pneumonia.    Assessment and Plan: * RLL pneumonia 05-18-2023 RLL PNA seen on CT chest today. admit to med/surg bed. Continue with IV rocephin/zithromax. Hx of hypogammaglobulinemia. Pt has had 4 bouts of pneumonia in the last year. May need to consider outpatient ID consult to consider IVIG as outpatient. Check strep and legionella. Hx of RLL pneumonia. Pt states she occ has trouble with swallowing. Will have ST evaluate her for potential aspiration.  05-19-2023 continue with rocephin/zithromax. Strep pneumo and legionella sent. Continue with QID nebs  05-20-2023 WBC down to 12.2K. was 15.8 on admission.  Strep pneumo negative. Awaiting legionella. Continue rocephin/zithromax. Needs PT eval. Asked pt to spend more time in recliner today and not lying down in bed. Continue with prednisone 20 mg every day and QID nebs.  05-21-2023 febrile this AM to 101.3, WBC 14.7. not ready for DC. Continue with chest physiotherapy with vest. Nebs qid. Blood cx negative. Will need to be afebrile to 24 hours prior to DC. Continue to work with PT. Plan for DC to home with dtr when she is stable for DC.  Acute on chronic respiratory failure with hypoxia (HCC) 05-18-2023 normally  on O2 @ 2-3 L/min only at night. Now having to use continuous O2 due to RLL pneumonia.  05-19-2023 on 1 L/min now. Continue with supplemental O2 at night.  05-20-2023 stable on 2 L/min. Pt has electric concentrator at home. Has home generator in case of power outage.  05-21-2023 uses home O2 at night. Does not wear her CPAP at night per dtr.  Sepsis without acute organ dysfunction (HCC) 05-18-2023 present on admission. Acute hypoxia with RA  sats 78%. WBC 15.8, RLL pneumonia in CT chest  Acute metabolic acidosis 05-20-2023 due to poor po intake and pneumonia.  05-21-2023 has CKD. Add po bicarb.  Current chronic use of systemic steroids - prednisone 7 mg daily for hx of RA 05-18-2023 continue with prednisone 7 mg daily. If she becomes hypotensive, give 100 mg IV solucortef.  05-19-2023 pt is relatively adrenal insufficiency given her prolonged use of prednisone. Not hypotensive. Will give 40 gm IV solumedrol and increase her prednisone to 20 mg daily(on chronic 7 mg daily).  05-20-2023 on prednisone 20 mg daily due to acute illness. S/p 40 mg IV solumedrol on 05-19-2023.  05-21-2023 continue prednisone 20 mg daily. Will need to see outpatient rheumatology after discharge to taper back down. Would send home with prednisone 20 mg daily and f/u with rheum to taper her back down to 7 mg daily.  Hypogammaglobulinemia (HCC) 05-18-2023 given recurrent pneumonias, she may need to consider outpatient ID referral for outpatient IVIG.  05-19-2023 pt states she gave herself an IVIG SQ injection within the last several weeks. She is not sure of the date.  05-20-2023 chronic. Stable.  05-21-2023 verified with pt's dtr who is a Charity fundraiser and lives with pt. Dtr states pt missed her SQ IgG infusion. Will have pharmacy dose her IVIG and give today.  Nocturnal hypoxia - 2-3 L/min 05-18-2023 acutely worse due to RLL pneumonia. Continue continuous supplemental O2.  05-19-2023 continue with nocturnal supplemental O2.  05-20-2023 chronic. Stable.  DNR (do not resuscitate) 05-18-2023 chronic. Pt states her dtr Ebony Scott is aware of her wishes. Pt states dtr Ebony Scott is a Engineer, civil (consulting).  DMII (diabetes mellitus, type 2) (HCC) 05-18-2023 chronic. Stable. Add SSI. Continue jardiance  05-19-2023 chronic. Stable. Continue SSI. May need to add lantus if increase dose of prednisone causes hyperglycemia.  05-20-2023 chronic. Stable.  05-21-2023 chronic. Stable. CBG  stable  History of pulmonary embolus (PE) 05-18-2023 chronic. Stable. Continue Xarelto.  05-19-2023 chronic. Stable. Continue xarelto.  05-20-2023 chronic. Stable.  05-21-2023 chronic. Stable. Continue xarelto  Tracheobronchomalacia 05-18-2023 chronic. Stable.  05-19-2023 chronic. Stable.  05-20-2023 chronic. Stable.  05-21-2023 chronic. stable  Rheumatoid arthritis (HCC) 05-18-2023 f/u with Dr. Dimple Casey with rheum. Continue with po prednisone.  05-19-2023 chronically on 7 mg daily prednisone. Will increase to 20 mg daily due to acute illness.  05-20-2023 stable. On 20 mg prednisone due to acute illness.  05-21-2023 continue prednisone 20 mg daily. Will need to see outpatient rheumatology after discharge to taper back down. Would send home with prednisone 20 mg daily and f/u with rheum to taper her back down to 7 mg daily.   Chronic pain syndrome 05-18-2023 continue ultram.  05-19-2023 due to excessive sleepiness, change ultram to po prn. As well as gabapentin to prn.  05-20-2023 stable. On prn gabapentin and ultram  Chronic kidney disease, stage 3b (HCC) - baseline Scr 1.3-1.5 05-18-2023 baseline Scr 1.3-1.5. today Scr 1.55  05-19-2023 after IVF overnight. Scr 1.38. stop IVF today. Allow pt to orally hydrate.  05-20-2023  Scr 1.22, BUN 31. Stable.  05-21-2023 stable. Scr 1.29, BUN 35  Chronic systolic CHF (congestive heart failure) (HCC) 05-20-2023 stable. Not exacerbated  05-21-2023 on twice a week lasix. No LE edema.  DVT prophylaxis: SCDs Start: 05/18/23 1600 rivaroxaban (XARELTO) tablet 20 mg     Code Status: Limited: Do not attempt resuscitation (DNR) -DNR-LIMITED -Do Not Intubate/DNI  Family Communication: discussed with pt's dtr [name] at bedside Disposition Plan: return home Reason for continuing need for hospitalization: remains on IV ABX. Getting IVIG today.  Objective: Vitals:   05/20/23 2030 05/21/23 0604 05/21/23 0953 05/21/23 0954  BP: 138/77 (!)  150/65  (!) 116/56  Pulse: 74 87  87  Resp: 14 19  18   Temp: 98 F (36.7 C) (!) 101.1 F (38.4 C)  98.8 F (37.1 C)  TempSrc: Oral Oral  Axillary  SpO2:  92% 96% 97%  Weight:      Height:        Intake/Output Summary (Last 24 hours) at 05/21/2023 1132 Last data filed at 05/21/2023 1000 Gross per 24 hour  Intake 720 ml  Output --  Net 720 ml   Filed Weights   05/18/23 1725  Weight: 58.5 kg    Examination:  Physical Exam Vitals and nursing note reviewed.  Constitutional:      General: She is not in acute distress.    Appearance: She is not toxic-appearing or diaphoretic.     Comments: Chronically ill appearing  HENT:     Head: Normocephalic and atraumatic.     Nose: Nose normal.  Eyes:     General: No scleral icterus. Cardiovascular:     Rate and Rhythm: Normal rate and regular rhythm.  Pulmonary:     Effort: Pulmonary effort is normal. No respiratory distress.     Comments: Bedside thoracic U/S shows lung consolidation right LL. No pleural effusions noted on either side. Abdominal:     General: Abdomen is flat. Bowel sounds are normal. There is no distension.     Palpations: Abdomen is soft.  Musculoskeletal:     Right lower leg: No edema.     Left lower leg: No edema.  Skin:    General: Skin is warm and dry.     Capillary Refill: Capillary refill takes less than 2 seconds.  Neurological:     General: No focal deficit present.     Mental Status: She is alert and oriented to person, place, and time.     Data Reviewed: I have personally reviewed following labs and imaging studies  CBC: Recent Labs  Lab 05/18/23 1330 05/19/23 0534 05/20/23 0429 05/21/23 0804  WBC 15.8* 16.2* 12.2* 14.7*  NEUTROABS 13.0* 12.5* 10.5* 11.8*  HGB 10.4* 10.2* 10.1* 10.8*  HCT 33.1* 33.3* 31.4* 33.8*  MCV 107.8* 111.7* 106.1* 104.0*  PLT 135* 121* 140* 169   Basic Metabolic Panel: Recent Labs  Lab 05/18/23 1330 05/19/23 0534 05/20/23 0429 05/21/23 0804  NA 139 138  138 138  K 3.7 4.2 4.0 3.9  CL 110 109 109 110  CO2 23 19* 18* 18*  GLUCOSE 98 79 161* 97  BUN 42* 28* 31* 35*  CREATININE 1.55* 1.38* 1.22* 1.29*  CALCIUM 8.3* 8.4* 8.6* 8.7*   GFR: Estimated Creatinine Clearance: 30.2 mL/min (A) (by C-G formula based on SCr of 1.29 mg/dL (H)). Liver Function Tests: Recent Labs  Lab 05/18/23 1330 05/19/23 0534  AST 40 35  ALT 55* 45*  ALKPHOS 47 51  BILITOT 1.0 1.1  PROT  6.6 6.3*  ALBUMIN 2.8* 2.6*   Coagulation Profile: Recent Labs  Lab 05/18/23 1330  INR 1.8*   BNP (last 3 results) Recent Labs    03/31/23 0508 04/01/23 0428 05/18/23 1331  BNP 1,953.7* 1,916.4* 302.6*   CBG: Recent Labs  Lab 05/20/23 1140 05/20/23 1649 05/20/23 2033 05/21/23 0735 05/21/23 1114  GLUCAP 193* 201* 142* 103* 165*   Sepsis Labs: Recent Labs  Lab 05/18/23 1322  LATICACIDVEN 1.3    Recent Results (from the past 240 hours)  Resp panel by RT-PCR (RSV, Flu A&B, Covid) Anterior Nasal Swab     Status: None   Collection Time: 05/18/23  1:05 PM   Specimen: Anterior Nasal Swab  Result Value Ref Range Status   SARS Coronavirus 2 by RT PCR NEGATIVE NEGATIVE Final    Comment: (NOTE) SARS-CoV-2 target nucleic acids are NOT DETECTED.  The SARS-CoV-2 RNA is generally detectable in upper respiratory specimens during the acute phase of infection. The lowest concentration of SARS-CoV-2 viral copies this assay can detect is 138 copies/mL. A negative result does not preclude SARS-Cov-2 infection and should not be used as the sole basis for treatment or other patient management decisions. A negative result may occur with  improper specimen collection/handling, submission of specimen other than nasopharyngeal swab, presence of viral mutation(s) within the areas targeted by this assay, and inadequate number of viral copies(<138 copies/mL). A negative result must be combined with clinical observations, patient history, and epidemiological information.  The expected result is Negative.  Fact Sheet for Patients:  BloggerCourse.com  Fact Sheet for Healthcare Providers:  SeriousBroker.it  This test is no t yet approved or cleared by the [place] FDA and  has been authorized for detection and/or diagnosis of SARS-CoV-2 by FDA under an Emergency Use Authorization (EUA). This EUA will remain  in effect (meaning this test can be used) for the duration of the COVID-19 declaration under Section 564(b)(1) of the Act, 21 U.S.C.section 360bbb-3(b)(1), unless the authorization is terminated  or revoked sooner.       Influenza A by PCR NEGATIVE NEGATIVE Final   Influenza B by PCR NEGATIVE NEGATIVE Final    Comment: (NOTE) The Xpert Xpress SARS-CoV-2/FLU/RSV plus assay is intended as an aid in the diagnosis of influenza from Nasopharyngeal swab specimens and should not be used as a sole basis for treatment. Nasal washings and aspirates are unacceptable for Xpert Xpress SARS-CoV-2/FLU/RSV testing.  Fact Sheet for Patients: BloggerCourse.com  Fact Sheet for Healthcare Providers: SeriousBroker.it  This test is not yet approved or cleared by the [place] FDA and has been authorized for detection and/or diagnosis of SARS-CoV-2 by FDA under an Emergency Use Authorization (EUA). This EUA will remain in effect (meaning this test can be used) for the duration of the COVID-19 declaration under Section 564(b)(1) of the Act, 21 U.S.C. section 360bbb-3(b)(1), unless the authorization is terminated or revoked.     Resp Syncytial Virus by PCR NEGATIVE NEGATIVE Final    Comment: (NOTE) Fact Sheet for Patients: BloggerCourse.com  Fact Sheet for Healthcare Providers: SeriousBroker.it  This test is not yet approved or cleared by the [place] FDA and has been authorized for detection and/or  diagnosis of SARS-CoV-2 by FDA under an Emergency Use Authorization (EUA). This EUA will remain in effect (meaning this test can be used) for the duration of the COVID-19 declaration under Section 564(b)(1) of the Act, 21 U.S.C. section 360bbb-3(b)(1), unless the authorization is terminated or revoked.  Performed at [facility], [address] [place], [place] [address]   Blood Culture (routine x 2)     Status: None (Preliminary result)   Collection Time: 05/18/23  1:12 PM   Specimen: BLOOD RIGHT ARM  Result Value Ref Range Status   Specimen Description   Final    BLOOD RIGHT ARM Performed at [facility], [address] [place], [place] [address]    Special Requests   Final    BOTTLES DRAWN AEROBIC AND ANAEROBIC Blood Culture adequate volume Performed at [facility], [address] [place], [place] [address]    Culture   Final    NO GROWTH 3 DAYS Performed at [facility], [address] [place], [place] [address]    Report Status PENDING  Incomplete  Blood Culture (routine x 2)     Status: None (Preliminary result)   Collection Time: 05/18/23  1:12 PM   Specimen: BLOOD RIGHT ARM  Result Value Ref Range Status   Specimen Description   Final    BLOOD RIGHT ARM Performed at [facility], [address], [place], [place] [address]    Special Requests   Final    BOTTLES DRAWN AEROBIC AND ANAEROBIC Blood Culture adequate volume Performed at [facility], [address] [place], [place] [address]    Culture   Final    NO GROWTH 3 DAYS Performed at [facility], [address], [place], [place] [address]    Report Status PENDING  Incomplete     Radiology Studies: No results found.  Scheduled Meds:  atorvastatin  80 mg Oral QPM   azithromycin  500 mg Oral Daily   benzonatate  200 mg Oral BID   clopidogrel  75 mg Oral Daily   docusate sodium  100 mg Oral BID   empagliflozin  10 mg  Oral Daily   fluticasone furoate-vilanterol  1 puff Inhalation Daily   And   umeclidinium bromide  1 puff Inhalation Daily   [START ON 05/22/2023] furosemide  40 mg Oral Once per day on Monday Wednesday   insulin aspart  0-5 Units Subcutaneous QHS   insulin aspart  0-9 Units Subcutaneous TID WC   ipratropium-albuterol  3 mL Nebulization BID   isosorbide-hydrALAZINE  1 tablet Oral BID   montelukast  10 mg Oral QPC supper   predniSONE  20 mg Oral Q breakfast   rivaroxaban  20 mg Oral Q supper   saccharomyces boulardii  250 mg Oral BID   sacubitril-valsartan  1 tablet Oral BID   sodium bicarbonate  650 mg Oral TID   Continuous Infusions:  cefTRIAXone (ROCEPHIN)  IV Stopped (05/21/23 1121)   Immune Globulin 10%       LOS: 3 days   Time spent: 45 minutes  Carollee Herter, DO  [facility]  05/21/2023, 11:32 AM

## 2023-05-21 NOTE — Plan of Care (Signed)
 ?  Problem: Education: ?Goal: Knowledge of General Education information will improve ?Description: Including pain rating scale, medication(s)/side effects and non-pharmacologic comfort measures ?Outcome: Progressing ?  ?Problem: Health Behavior/Discharge Planning: ?Goal: Ability to manage health-related needs will improve ?Outcome: Progressing ?  ?Problem: Clinical Measurements: ?Goal: Ability to maintain clinical measurements within normal limits will improve ?Outcome: Progressing ?Goal: Diagnostic test results will improve ?Outcome: Progressing ?  ?

## 2023-05-22 DIAGNOSIS — R509 Fever, unspecified: Secondary | ICD-10-CM

## 2023-05-22 LAB — MRSA NEXT GEN BY PCR, NASAL: MRSA by PCR Next Gen: NOT DETECTED

## 2023-05-22 LAB — CBC WITH DIFFERENTIAL/PLATELET
Abs Immature Granulocytes: 0.2 10*3/uL — ABNORMAL HIGH (ref 0.00–0.07)
Basophils Absolute: 0 10*3/uL (ref 0.0–0.1)
Basophils Relative: 0 %
Eosinophils Absolute: 0 10*3/uL (ref 0.0–0.5)
Eosinophils Relative: 0 %
HCT: 31.1 % — ABNORMAL LOW (ref 36.0–46.0)
Hemoglobin: 9.8 g/dL — ABNORMAL LOW (ref 12.0–15.0)
Immature Granulocytes: 1 %
Lymphocytes Relative: 5 %
Lymphs Abs: 0.7 10*3/uL (ref 0.7–4.0)
MCH: 33.8 pg (ref 26.0–34.0)
MCHC: 31.5 g/dL (ref 30.0–36.0)
MCV: 107.2 fL — ABNORMAL HIGH (ref 80.0–100.0)
Monocytes Absolute: 1.4 10*3/uL — ABNORMAL HIGH (ref 0.1–1.0)
Monocytes Relative: 9 %
Neutro Abs: 12.2 10*3/uL — ABNORMAL HIGH (ref 1.7–7.7)
Neutrophils Relative %: 85 %
Platelets: 149 10*3/uL — ABNORMAL LOW (ref 150–400)
RBC: 2.9 MIL/uL — ABNORMAL LOW (ref 3.87–5.11)
RDW: 18.5 % — ABNORMAL HIGH (ref 11.5–15.5)
WBC: 14.6 10*3/uL — ABNORMAL HIGH (ref 4.0–10.5)
nRBC: 0 % (ref 0.0–0.2)

## 2023-05-22 LAB — EXPECTORATED SPUTUM ASSESSMENT W GRAM STAIN, RFLX TO RESP C

## 2023-05-22 LAB — COMPREHENSIVE METABOLIC PANEL WITH GFR
ALT: 38 U/L (ref 0–44)
AST: 30 U/L (ref 15–41)
Albumin: 2.5 g/dL — ABNORMAL LOW (ref 3.5–5.0)
Alkaline Phosphatase: 50 U/L (ref 38–126)
Anion gap: 7 (ref 5–15)
BUN: 32 mg/dL — ABNORMAL HIGH (ref 8–23)
CO2: 21 mmol/L — ABNORMAL LOW (ref 22–32)
Calcium: 8.5 mg/dL — ABNORMAL LOW (ref 8.9–10.3)
Chloride: 111 mmol/L (ref 98–111)
Creatinine, Ser: 1.06 mg/dL — ABNORMAL HIGH (ref 0.44–1.00)
GFR, Estimated: 55 mL/min — ABNORMAL LOW (ref >60)
Glucose, Bld: 82 mg/dL (ref 70–99)
Potassium: 4.1 mmol/L (ref 3.5–5.1)
Sodium: 139 mmol/L (ref 135–145)
Total Bilirubin: 0.5 mg/dL (ref 0.0–1.2)
Total Protein: 6.7 g/dL (ref 6.5–8.1)

## 2023-05-22 LAB — STREP PNEUMONIAE URINARY ANTIGEN: Strep Pneumo Urinary Antigen: NEGATIVE

## 2023-05-22 LAB — GLUCOSE, CAPILLARY
Glucose-Capillary: 97 mg/dL (ref 70–99)
Glucose-Capillary: 126 mg/dL — ABNORMAL HIGH (ref 70–99)
Glucose-Capillary: 161 mg/dL — ABNORMAL HIGH (ref 70–99)
Glucose-Capillary: 165 mg/dL — ABNORMAL HIGH (ref 70–99)

## 2023-05-22 NOTE — Progress Notes (Addendum)
 PROGRESS NOTE    JAYLINN HELLENBRAND  ZOX:[id number] DOB: 02/07/1951 DOA: 05/18/2023 PCP: [name], MD  Chief Complaint  Patient presents with   Cough   Fever    Brief Narrative:   73 year old Caucasian female history of seropositive rheumatoid arthritis on chronic prednisone, chronic respiratory failure on oxygen only at night, chronic prednisone use with 7 mg of prednisone Laelah Siravo day, prior history of hypogammaglobulinemia, CKD stage IIIb baseline creatinine 1.3-1.5, chronic pain syndrome, history of tracheobronchomalacia, prior history of PE on chronic anticoagulation presents to the ER today with Sakiya Stepka 1 day history of feeling poorly.  1 day history of fever.  She has had about 4 bouts of pneumonia in the last year.  She was discharged to home during mid February 2025 due to pneumonia.  She completed Lavetta Geier 5-day course of Keflex at home.   She presented with recurrent pneumonia with CT showing multilobar pneumonia.    See below for additional details.     Assessment & Plan:   Principal Problem:   RLL pneumonia Active Problems:   Sepsis without acute organ dysfunction (HCC)   Acute on chronic respiratory failure with hypoxia (HCC)   Hypogammaglobulinemia (HCC)   Current chronic use of systemic steroids - prednisone 7 mg daily for hx of RA   Acute metabolic acidosis   Chronic systolic CHF (congestive heart failure) (HCC)   Chronic kidney disease, stage 3b (HCC) - baseline Scr 1.3-1.5   Chronic pain syndrome   Rheumatoid arthritis (HCC)   Tracheobronchomalacia   History of pulmonary embolus (PE)   DMII (diabetes mellitus, type 2) (HCC)   DNR (do not resuscitate)   Nocturnal hypoxia - 2-3 L/min  Sepsis due to Multilobar Pneumonia Acute on Chronic Hypoxic Respiratory Failure Currently on 2 L [place] (typically on 2 L at night) CT chest with progressive airspace disease within superior segment of RLL and new patchy airspace opacities within the right upper and middle lobes (difficult to tell  whether this represents progression of pneumonia 7 weeks ago or recurrent pneumonia -- of note, she reported to me improvement in symptoms after last hospitalization -> suspect this represents recurrent pneumonia in this setting). Continue current abx Blood cultures Ngx4 Negative covid, flu, RSV Follow urine strep, urine legionella, sputum culture.  MRSA PCR. Small volume hemotysis noted, suspect due to pneumonia If she doesn't turn around soon, will discuss case with pulmonology Sepsis physiology resolved.  Present on admission with hypoxia, leukocytosis and evidence of pneumonia on chest CT.  Metabolic Acidosis  Will monitor, bicarb   Rheumatoid Arthritis She's on prednisone chronically Currently on steroid taper, plan to taper gradually back to chronic 7 mg daily  Hypogammaglobulinemia S/p IVIG here  T2DM SSI  History Pulmonary Embolus Xarelto   Tracheobronchomalacia Noted  Chronic Pain  Prn gabapentin and tramadol  HFrEF Appears compensated Prior EF 06/2022 30-35%, grade 1 diastolic dysfunction    DVT prophylaxis: xarelto Code Status: DNR Family Communication: none - called daughter, no answer Disposition:   Status is: Inpatient Remains inpatient appropriate because: need for ongoing inpatient care   Consultants:  none  Procedures:  none  Antimicrobials:  Anti-infectives (From admission, onward)    Start     Dose/Rate Route Frequency Ordered Stop   05/19/23 1200  cefTRIAXone (ROCEPHIN) 2 g in sodium chloride 0.9 % 100 mL IVPB        2 g 200 mL/hr over 30 Minutes Intravenous Every 24 hours 05/18/23 1600 05/24/23 0759   05/18/23 1700  azithromycin [facility]) tablet 500 mg        500 mg Oral Daily 05/18/23 1600 05/22/23 0859   05/18/23 1230  vancomycin (VANCOCIN) IVPB 1000 mg/200 mL premix        1,000 mg 200 mL/hr over 60 Minutes Intravenous  Once 05/18/23 1215 05/18/23 1510   05/18/23 1230  ceFEPIme (MAXIPIME) 2 g in sodium chloride 0.9 % 100 mL IVPB         2 g 200 mL/hr over 30 Minutes Intravenous  Once 05/18/23 1215 05/18/23 1347       Subjective: Notes she feels 25% of normal self Feels like she's gradually improving Some small volume hemoptysis in past day or so  Objective: Vitals:   05/22/23 0548 05/22/23 0900 05/22/23 0911 05/22/23 1357  BP: 134/65   112/61  Pulse: 70   84  Resp: 18   18  Temp: 98 F (36.7 C)   98.3 F (36.8 C)  TempSrc: Oral   Oral  SpO2: 93% 93% 93% 94%  Weight:      Height:        Intake/Output Summary (Last 24 hours) at 05/22/2023 1606 Last data filed at 05/22/2023 1400 Gross per 24 hour  Intake 1210 ml  Output 200 ml  Net 1010 ml   Filed Weights   05/18/23 1725 05/21/23 1333  Weight: 58.5 kg 57.9 kg    Examination:  General exam: Appears tired Respiratory system: rhonchi more prominent at R base Cardiovascular system: RRR Gastrointestinal system: Abdomen is nondistended, soft and nontender. Central nervous system: Alert and oriented. No focal neurological deficits. Extremities: no LEE    Data Reviewed: I have personally reviewed following labs and imaging studies  CBC: Recent Labs  Lab 05/18/23 1330 05/19/23 0534 05/20/23 0429 05/21/23 0804 05/22/23 0452  WBC 15.8* 16.2* 12.2* 14.7* 14.6*  NEUTROABS 13.0* 12.5* 10.5* 11.8* 12.2*  HGB 10.4* 10.2* 10.1* 10.8* 9.8*  HCT 33.1* 33.3* 31.4* 33.8* 31.1*  MCV 107.8* 111.7* 106.1* 104.0* 107.2*  PLT 135* 121* 140* 169 149*    Basic Metabolic Panel: Recent Labs  Lab 05/18/23 1330 05/19/23 0534 05/20/23 0429 05/21/23 0804 05/22/23 0452  NA 139 138 138 138 139  K 3.7 4.2 4.0 3.9 4.1  CL 110 109 109 110 111  CO2 23 19* 18* 18* 21*  GLUCOSE 98 79 161* 97 82  BUN 42* 28* 31* 35* 32*  CREATININE 1.55* 1.38* 1.22* 1.29* 1.06*  CALCIUM 8.3* 8.4* 8.6* 8.7* 8.5*    GFR: Estimated Creatinine Clearance: 36.6 mL/min (Cathern Tahir) (by C-G formula based on SCr of 1.06 mg/dL (H)).  Liver Function Tests: Recent Labs  Lab 05/18/23 1330  05/19/23 0534 05/22/23 0452  AST 40 35 30  ALT 55* 45* 38  ALKPHOS 47 51 50  BILITOT 1.0 1.1 0.5  PROT 6.6 6.3* 6.7  ALBUMIN 2.8* 2.6* 2.5*    CBG: Recent Labs  Lab 05/21/23 1114 05/21/23 1719 05/21/23 2050 05/22/23 0725 05/22/23 1137  GLUCAP 165* 163* 114* 97 126*     Recent Results (from the past 240 hours)  Resp panel by RT-PCR (RSV, Flu Karol Liendo&B, Covid) Anterior Nasal Swab     Status: None   Collection Time: 05/18/23  1:05 PM   Specimen: Anterior Nasal Swab  Result Value Ref Range Status   SARS Coronavirus 2 by RT PCR NEGATIVE NEGATIVE Final    Comment: (NOTE) SARS-CoV-2 target nucleic acids are NOT DETECTED.  The SARS-CoV-2 RNA is generally detectable in upper respiratory specimens during the  acute phase of infection. The lowest concentration of SARS-CoV-2 viral copies this assay can detect is 138 copies/mL. Nakoa Ganus negative result does not preclude SARS-Cov-2 infection and should not be used as the sole basis for treatment or other patient management decisions. Gwenevere Goga negative result may occur with  improper specimen collection/handling, submission of specimen other than nasopharyngeal swab, presence of viral mutation(s) within the areas targeted by this assay, and inadequate number of viral copies(<138 copies/mL). Dametra Whetsel negative result must be combined with clinical observations, patient history, and epidemiological information. The expected result is Negative.  Fact Sheet for Patients:  BloggerCourse.com  Fact Sheet for Healthcare Providers:  SeriousBroker.it  This test is no t yet approved or cleared by the [place] FDA and  has been authorized for detection and/or diagnosis of SARS-CoV-2 by FDA under an Emergency Use Authorization (EUA). This EUA will remain  in effect (meaning this test can be used) for the duration of the COVID-19 declaration under Section 564(b)(1) of the Act, 21 U.S.C.section 360bbb-3(b)(1),  unless the authorization is terminated  or revoked sooner.       Influenza Damiah Mcdonald by PCR NEGATIVE NEGATIVE Final   Influenza B by PCR NEGATIVE NEGATIVE Final    Comment: (NOTE) The Xpert Xpress SARS-CoV-2/FLU/RSV plus assay is intended as an aid in the diagnosis of influenza from Nasopharyngeal swab specimens and should not be used as Jameyah Fennewald sole basis for treatment. Nasal washings and aspirates are unacceptable for Xpert Xpress SARS-CoV-2/FLU/RSV testing.  Fact Sheet for Patients: BloggerCourse.com  Fact Sheet for Healthcare Providers: SeriousBroker.it  This test is not yet approved or cleared by the [place] FDA and has been authorized for detection and/or diagnosis of SARS-CoV-2 by FDA under an Emergency Use Authorization (EUA). This EUA will remain in effect (meaning this test can be used) for the duration of the COVID-19 declaration under Section 564(b)(1) of the Act, 21 U.S.C. section 360bbb-3(b)(1), unless the authorization is terminated or revoked.     Resp Syncytial Virus by PCR NEGATIVE NEGATIVE Final    Comment: (NOTE) Fact Sheet for Patients: BloggerCourse.com  Fact Sheet for Healthcare Providers: SeriousBroker.it  This test is not yet approved or cleared by the [place] FDA and has been authorized for detection and/or diagnosis of SARS-CoV-2 by FDA under an Emergency Use Authorization (EUA). This EUA will remain in effect (meaning this test can be used) for the duration of the COVID-19 declaration under Section 564(b)(1) of the Act, 21 U.S.C. section 360bbb-3(b)(1), unless the authorization is terminated or revoked.  Performed at [facility], [address] [place], [place] [address]   Blood Culture (routine x 2)     Status: None (Preliminary result)   Collection Time: 05/18/23  1:12 PM   Specimen: BLOOD RIGHT ARM  Result Value Ref  Range Status   Specimen Description   Final    BLOOD RIGHT ARM Performed at [facility], [address], [place], [place] [address]    Special Requests   Final    BOTTLES DRAWN AEROBIC AND ANAEROBIC Blood Culture adequate volume Performed at [facility], [address] [place], [place] [address]    Culture   Final    NO GROWTH 4 DAYS Performed at [facility], [address], [place], [place] [address]    Report Status PENDING  Incomplete  Blood Culture (routine x 2)     Status: None (Preliminary result)   Collection Time: 05/18/23  1:12 PM   Specimen:  BLOOD RIGHT ARM  Result Value Ref Range Status   Specimen Description   Final    BLOOD RIGHT ARM Performed at [facility], [address], [place], [place] [address]    Special Requests   Final    BOTTLES DRAWN AEROBIC AND ANAEROBIC Blood Culture adequate volume Performed at [facility], [address] [place], [place] [address]    Culture   Final    NO GROWTH 4 DAYS Performed at [facility], [address], [place], [place] [address]    Report Status PENDING  Incomplete         Radiology Studies: No results found.      Scheduled Meds:  atorvastatin  80 mg Oral QPM   benzonatate  200 mg Oral BID   clopidogrel  75 mg Oral Daily   docusate sodium  100 mg Oral BID   empagliflozin  10 mg Oral Daily   fluticasone furoate-vilanterol  1 puff Inhalation Daily   And   umeclidinium bromide  1 puff Inhalation Daily   furosemide  40 mg Oral Once per day on Monday Wednesday   insulin aspart  0-5 Units Subcutaneous QHS   insulin aspart  0-9 Units Subcutaneous TID WC   ipratropium-albuterol  3 mL Nebulization BID   isosorbide-hydrALAZINE  1 tablet Oral BID   montelukast  10 mg Oral QPC supper   predniSONE  20 mg Oral Q breakfast   rivaroxaban  20 mg Oral Q supper   saccharomyces boulardii  250 mg Oral BID   sacubitril-valsartan  1 tablet Oral BID    sodium bicarbonate  650 mg Oral TID   Continuous Infusions:  cefTRIAXone (ROCEPHIN)  IV 2 g (05/22/23 0918)     LOS: 4 days    Time spent: over 30 min    Lacretia Nicks, MD [facility]   To contact the attending provider between 7A-7P or the covering provider during after hours 7P-7A, please log into the web site [email or web address] and access using universal  password for that web site. If you do not have the password, please call the hospital operator.  05/22/2023, 4:06 PM

## 2023-05-22 NOTE — Progress Notes (Signed)
 Mobility Specialist - Progress Note   05/22/23 0847  Oxygen Therapy  O2 Device Nasal Cannula  O2 Flow Rate (L/min) 2 L/min  Mobility  Activity Ambulated with assistance in hallway;Transferred to/from [facility]  Level of Assistance Contact guard assist, steadying assist  Assistive Device Front wheel walker  Distance Ambulated (ft) 15 ft  Activity Response Tolerated well  Mobility Referral Yes  Mobility visit 1 Mobility  Mobility Specialist Start Time (ACUTE ONLY) [id number]  Mobility Specialist Stop Time (ACUTE ONLY) 0847  Mobility Specialist Time Calculation (min) (ACUTE ONLY) 21 min   Pt received in bed and agreeable to mobility. Prior to ambulating, pt requested assistance to [facility]. Pt fatigued with session. SpO2 >91% throughout session. Pt to recliner after session with all needs met. Chair alarm on. RN made aware of session.    Pre-mobility: 94% SpO2 (2L [place]) Post-mobility: 91% SPO2 (2L [place])  Chief Technology Officer

## 2023-05-23 DIAGNOSIS — J9601 Acute respiratory failure with hypoxia: Secondary | ICD-10-CM | POA: Diagnosis not present

## 2023-05-23 LAB — GLUCOSE, CAPILLARY
Glucose-Capillary: 69 mg/dL — ABNORMAL LOW (ref 70–99)
Glucose-Capillary: 139 mg/dL — ABNORMAL HIGH (ref 70–99)
Glucose-Capillary: 150 mg/dL — ABNORMAL HIGH (ref 70–99)
Glucose-Capillary: 204 mg/dL — ABNORMAL HIGH (ref 70–99)

## 2023-05-23 LAB — COMPREHENSIVE METABOLIC PANEL WITH GFR
ALT: 47 U/L — ABNORMAL HIGH (ref 0–44)
AST: 41 U/L (ref 15–41)
Albumin: 2.5 g/dL — ABNORMAL LOW (ref 3.5–5.0)
Alkaline Phosphatase: 48 U/L (ref 38–126)
Anion gap: 9 (ref 5–15)
BUN: 33 mg/dL — ABNORMAL HIGH (ref 8–23)
CO2: 19 mmol/L — ABNORMAL LOW (ref 22–32)
Calcium: 8.4 mg/dL — ABNORMAL LOW (ref 8.9–10.3)
Chloride: 108 mmol/L (ref 98–111)
Creatinine, Ser: 1.15 mg/dL — ABNORMAL HIGH (ref 0.44–1.00)
GFR, Estimated: 50 mL/min — ABNORMAL LOW (ref >60)
Glucose, Bld: 88 mg/dL (ref 70–99)
Potassium: 3.9 mmol/L (ref 3.5–5.1)
Sodium: 136 mmol/L (ref 135–145)
Total Bilirubin: 0.6 mg/dL (ref 0.0–1.2)
Total Protein: 6.6 g/dL (ref 6.5–8.1)

## 2023-05-23 LAB — CBC WITH DIFFERENTIAL/PLATELET
Abs Immature Granulocytes: 0.18 10*3/uL — ABNORMAL HIGH (ref 0.00–0.07)
Basophils Absolute: 0 10*3/uL (ref 0.0–0.1)
Basophils Relative: 0 %
Eosinophils Absolute: 0.1 10*3/uL (ref 0.0–0.5)
Eosinophils Relative: 1 %
HCT: 32.2 % — ABNORMAL LOW (ref 36.0–46.0)
Hemoglobin: 9.9 g/dL — ABNORMAL LOW (ref 12.0–15.0)
Immature Granulocytes: 1 %
Lymphocytes Relative: 7 %
Lymphs Abs: 0.8 10*3/uL (ref 0.7–4.0)
MCH: 33 pg (ref 26.0–34.0)
MCHC: 30.7 g/dL (ref 30.0–36.0)
MCV: 107.3 fL — ABNORMAL HIGH (ref 80.0–100.0)
Monocytes Absolute: 0.9 10*3/uL (ref 0.1–1.0)
Monocytes Relative: 7 %
Neutro Abs: 10.5 10*3/uL — ABNORMAL HIGH (ref 1.7–7.7)
Neutrophils Relative %: 84 %
Platelets: 143 10*3/uL — ABNORMAL LOW (ref 150–400)
RBC: 3 MIL/uL — ABNORMAL LOW (ref 3.87–5.11)
RDW: 18.4 % — ABNORMAL HIGH (ref 11.5–15.5)
WBC: 12.6 10*3/uL — ABNORMAL HIGH (ref 4.0–10.5)
nRBC: 0 % (ref 0.0–0.2)

## 2023-05-23 LAB — CULTURE, BLOOD (ROUTINE X 2)
Special Requests: ADEQUATE
Special Requests: ADEQUATE

## 2023-05-23 LAB — PHOSPHORUS: Phosphorus: 3.2 mg/dL (ref 2.5–4.6)

## 2023-05-23 LAB — MAGNESIUM: Magnesium: 2.2 mg/dL (ref 1.7–2.4)

## 2023-05-23 MED ORDER — FLUCONAZOLE 150 MG PO TABS
150.0000 mg | ORAL_TABLET | Freq: Every day | ORAL | Status: DC
Start: 2023-05-23 — End: 2023-05-24
  Administered 2023-05-23: 150 mg via ORAL
  Filled 2023-05-23 (×2): qty 1

## 2023-05-23 MED ORDER — CLOTRIMAZOLE 10 MG MT TROC
10.0000 mg | Freq: Every day | OROMUCOSAL | Status: DC
  Administered 2023-05-23 – 2023-05-24 (×5): 10 mg via ORAL
  Filled 2023-05-23 (×7): qty 1

## 2023-05-23 NOTE — Progress Notes (Signed)
 Physical Therapy Treatment Patient Details Name: Ebony Scott MRN: [id number] DOB: [birth year] Today's Date: 05/23/2023   History of Present Illness Patient is a 73 y.o.  female with history of immunosuppression (chronic prednisone for RA/weekly IVIG for hypogammaglobulinemia)-chronic HFrEF-s/p ICD, CAD, bronchiectasis/asthma with chronic hypoxic respiratory failure on nocturnal oxygen-admitted on 05/18/23 with fever, RLL< pneumonia, hypoxia, SOB    PT Comments  Pt tolerated increased ambulation distance of 100' with RW, no loss of balance, SpO2 90% on room air, no dyspnea noted. Ambulation distance limited by fatigue.     If plan is discharge home, recommend the following: Assistance with cooking/housework;Assist for transportation;A little help with bathing/dressing/bathroom;A little help with walking and/or transfers   Can travel by private vehicle        Equipment Recommendations  None recommended by PT    Recommendations for Other Services       Precautions / Restrictions Precautions Precautions: Fall Recall of Precautions/Restrictions: Intact Precaution/Restrictions Comments: monitor O2 Restrictions Weight Bearing Restrictions Per Provider Order: No     Mobility  Bed Mobility Overal bed mobility: Modified Independent             General bed mobility comments: sit to supine, used bedrail    Transfers Overall transfer level: Needs assistance Equipment used: Rolling walker (2 wheels) Transfers: Sit to/from Stand Sit to Stand: Supervision           General transfer comment: VCs hand placement    Ambulation/Gait Ambulation/Gait assistance: Contact guard assist Gait Distance (Feet): 100 Feet Assistive device: Rolling walker (2 wheels) Gait Pattern/deviations: Step-through pattern, Decreased stride length Gait velocity: decr     General Gait Details: steady, no loss of balance, SpO2 90% on room air   Stairs             Wheelchair Mobility      Tilt Bed    Modified Rankin (Stroke Patients Only)       Balance Overall balance assessment: Mild deficits observed, not formally tested                                          Communication Communication Communication: No apparent difficulties  Cognition Arousal: Alert Behavior During Therapy: WFL for tasks assessed/performed   PT - Cognitive impairments: Memory                       PT - Cognition Comments: pt reports she can't remember if she's had any falls in the past 6 months Following commands: Intact      Cueing    Exercises      General Comments        Pertinent Vitals/Pain Pain Assessment Pain Score: 5  Pain Location: B feet Pain Descriptors / Indicators: Aching Pain Intervention(s): Limited activity within patient's tolerance    Home Living                          Prior Function            PT Goals (current goals can now be found in the care plan section) Acute Rehab PT Goals Patient Stated Goal: to be strong enough to take care of myself PT Goal Formulation: With patient Time For Goal Achievement: 06/03/23 Potential to Achieve Goals: Good Progress towards PT goals: Progressing toward goals    Frequency  Min 3X/week      PT Plan      Co-evaluation              AM-PAC PT "6 Clicks" Mobility   Outcome Measure  Help needed turning from your back to your side while in a flat bed without using bedrails?: None Help needed moving from lying on your back to sitting on the side of a flat bed without using bedrails?: None Help needed moving to and from a bed to a chair (including a wheelchair)?: None Help needed standing up from a chair using your arms (e.g., wheelchair or bedside chair)?: None Help needed to walk in hospital room?: A Little Help needed climbing 3-5 steps with a railing? : A Little 6 Click Score: 22    End of Session Equipment Utilized During Treatment: Gait  belt Activity Tolerance: Patient tolerated treatment well Patient left: in chair;with call bell/phone within reach;with chair alarm set Nurse Communication: Mobility status PT Visit Diagnosis: Unsteadiness on feet (R26.81);Muscle weakness (generalized) (M62.81);Difficulty in walking, not elsewhere classified (R26.2)     Time: 8119-1478 PT Time Calculation (min) (ACUTE ONLY): 14 min  Charges:    $Gait Training: 8-22 mins PT General Charges $$ ACUTE PT VISIT: 1 Visit                     Tamala Ser PT 05/23/2023  Acute Rehabilitation Services  Office [phone]

## 2023-05-23 NOTE — Consult Note (Signed)
 NAME:  Ebony Scott, MRN:  [id number], DOB:  September 17, 1950, LOS: 5 ADMISSION DATE:  05/18/2023, CONSULTATION DATE: 05/23/2023 REFERRING MD: Dr. [name] Birk, CHIEF COMPLAINT: Pneumonia  History of Present Illness:  Asked to see patient with recurrent pneumonia Came in with fever, shortness of breath cough with mucus production.  Hypoxemia-pulse oximetry was reading lower than usual Recently treated for pneumonia early February 2025  Does have a history of bronchiectasis, asthma, chronic respiratory failure, obstructive sleep apnea for which she is on CPAP History of rheumatoid arthritis, on chronic prednisone therapy, hypogammaglobulinemia-on replacement. Was treated for pneumonia in January  CT scan showing persistent infiltrate in the right base -Infiltrate may be a little worse than one performed in February 2025  Pertinent  Medical History   Past Medical History:  Diagnosis Date   Anemia    Angio-edema    Breast cancer (HCC) 1998   Left breast, in remission   Bronchiectasis (HCC)    CHF (congestive heart failure) (HCC)    Closed displaced fracture of metatarsal bone of right foot 01/20/2019   Closed fracture of fifth metatarsal bone of right foot, initial encounter 01/15/2018   Clostridium difficile colitis 01/14/2018   Coronary artery disease    Depression    "some; don't take anything for it" (02/20/2012), pt denies dx as of 06/02/21   Diverticulosis    DVT (deep venous thrombosis) (HCC)    Fibromyalgia 11/2011   Fracture of base of fifth metatarsal bone with routine healing, left    GAVE (gastric antral vascular ectasia)    GERD (gastroesophageal reflux disease)    Headache(784.0)    "related to allergies; more at different times during the year" (02/20/2012)   Hemorrhoids    Hiatal hernia    back and neck   History of blood transfusion 2023   1 unit, 5 units of Iron   Hx of adenomatous colonic polyps 04/12/2016   Hypercholesteremia    good cholesterol is high    Hypothyroidism    h/o Graves disease   IBS (irritable bowel syndrome)    Moderate persistent asthma    -FeV1 72% 2011, -IgE 102 2011, CT sinus Neg 2011   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 Casimer Lanius   Sleep apnea    uses cpap nightly   Tracheobronchomalacia    Vasculitis (HCC) 12/13/2017   Significant Hospital Events: Including procedures, antibiotic start and stop dates in addition to other pertinent events   CT chest 05/18/2023--right lower lobe infiltrate, compared with CT 03/29/2023  Interim History / Subjective:  Cough, shortness of breath, fatigue No chest pains or chest discomfort Mucus production  Objective   Blood pressure (!) 119/55, pulse 70, temperature 98.6 F (37 C), temperature source Oral, resp. rate 18, height 5' (1.524 m), weight 57.9 kg, SpO2 95%.        Intake/Output Summary (Last 24 hours) at 05/23/2023 1540 Last data filed at 05/23/2023 1500 Gross per 24 hour  Intake 1200 ml  Output 400 ml  Net 800 ml   Filed Weights   05/18/23 1725 05/21/23 1333  Weight: 58.5 kg 57.9 kg    Examination: General: Elderly lady, does not appear to be in distress HENT: Moist oral mucosa Lungs: Fair air entry, rales at the right  base Cardiovascular: S1-S2 appreciated Abdomen: Soft, bowel sounds appreciated Extremities: No clubbing, no edema Neuro: Awake alert oriented x 3 GU:   Resolved Hospital Problem list     Assessment & Plan:  Multilobar pneumonia -Appears to be improving on current antibiotic therapy -Complete course of antibiotic therapy -Can plan for discharge in the next 24 to 48 hours if she continues to feel better  Important to repeat CT scan in about 6 weeks to follow infiltrate to resolution or at least to know if there is any  significant improvement Clinically did improve following treatment in February  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

## 2023-05-23 NOTE — Progress Notes (Addendum)
 PROGRESS NOTE    Ebony Scott  WFU:[id number] DOB: [birth year] DOA: 05/18/2023 PCP: Ebony [name], MD  Chief Complaint  Patient presents with   Cough   Fever    Brief Narrative:   73 year old Caucasian female history of seropositive rheumatoid arthritis on chronic prednisone, chronic respiratory failure on oxygen only at night, chronic prednisone use with 7 mg of prednisone Ebony Scott day, prior history of hypogammaglobulinemia, CKD stage IIIb baseline creatinine 1.3-1.5, chronic pain syndrome, history of tracheobronchomalacia, prior history of PE on chronic anticoagulation presents to the ER today with Ebony Scott 1 day history of feeling poorly.  1 day history of fever.  She has had about 4 bouts of pneumonia in the last year.  She was discharged to home during mid February 2025 due to pneumonia.  She completed [place] Piontek 5-day course of Keflex at home.   She presented with recurrent pneumonia with CT showing multilobar pneumonia.    See below for additional details.     Assessment & Plan:   Principal Problem:   RLL pneumonia Active Problems:   Sepsis without acute organ dysfunction (HCC)   Acute on chronic respiratory failure with hypoxia (HCC)   Hypogammaglobulinemia (HCC)   Current chronic use of systemic steroids - prednisone 7 mg daily for hx of RA   Acute metabolic acidosis   Chronic systolic CHF (congestive heart failure) (HCC)   Chronic kidney disease, stage 3b (HCC) - baseline Scr 1.3-1.5   Chronic pain syndrome   Rheumatoid arthritis (HCC)   Tracheobronchomalacia   History of pulmonary embolus (PE)   DMII (diabetes mellitus, type 2) (HCC)   DNR (do not resuscitate)   Nocturnal hypoxia - 2-3 L/min   Fever  Sepsis due to Multilobar Pneumonia Acute on Chronic Hypoxic Respiratory Failure Currently on RA (typically on 2 L at night) CT chest with progressive airspace disease within superior segment of RLL and new patchy airspace opacities within the right upper and middle lobes (difficult to  tell whether this represents progression of pneumonia 7 weeks ago or recurrent pneumonia -- of note, she reported to me improvement in symptoms after last hospitalization -> suspect this represents recurrent pneumonia in this setting). Continue current abx Blood cultures Ngx4 Negative covid, flu, RSV Follow urine strep, urine legionella, sputum culture.  MRSA PCR. Small volume hemotysis noted, suspect due to pneumonia If she doesn't turn around soon, will discuss case with pulmonology Improved today from oxygenation standpoint, but she's not bounced back like I'd typically expect - will get additional collateral from daughter - consider repeat imaging Sepsis physiology resolved.  Present on admission with hypoxia, leukocytosis and evidence of pneumonia on chest CT.  Metabolic Acidosis  Will monitor, bicarb   Rheumatoid Arthritis She's on prednisone chronically Currently on steroid taper, plan to taper gradually back to chronic 7 mg daily  Hypogammaglobulinemia S/p IVIG here on 4/1  T2DM SSI  History Pulmonary Embolus Xarelto   Tracheobronchomalacia Noted  Chronic Pain  Prn gabapentin and tramadol  HFrEF Appears compensated Prior EF 06/2022 30-35%, grade 1 diastolic dysfunction  Thrush Vaginal Candidiasis Fluconazole x1, clotrimazole troche    DVT prophylaxis: xarelto Code Status: DNR Family Communication: none - called daughter, discussed on 4/3 Disposition:   Status is: Inpatient Remains inpatient appropriate because: need for ongoing inpatient care   Consultants:  none  Procedures:  none  Antimicrobials:  Anti-infectives (From admission, onward)    Start     Dose/Rate Route Frequency Ordered Stop   05/23/23 1200  fluconazole (DIFLUCAN)  tablet 150 mg        150 mg Oral Daily 05/23/23 0931     05/19/23 1200  cefTRIAXone (ROCEPHIN) 2 g in sodium chloride 0.9 % 100 mL IVPB        2 g 200 mL/hr over 30 Minutes Intravenous Every 24 hours 05/18/23 1600  05/26/23 0759   05/18/23 1700  azithromycin (ZITHROMAX) tablet 500 mg        500 mg Oral Daily 05/18/23 1600 05/22/23 0859   05/18/23 1230  vancomycin (VANCOCIN) IVPB 1000 mg/200 mL premix        1,000 mg 200 mL/hr over 60 Minutes Intravenous  Once 05/18/23 1215 05/18/23 1510   05/18/23 1230  ceFEPIme (MAXIPIME) 2 g in sodium chloride 0.9 % 100 mL IVPB        2 g 200 mL/hr over 30 Minutes Intravenous  Once 05/18/23 1215 05/18/23 1347       Subjective: Still feels tired and generally poorly Small volume hemoptysis  Objective: Vitals:   05/22/23 1357 05/22/23 2014 05/23/23 0533 05/23/23 0534  BP: 112/61 117/63 (!) 161/75 (!) 157/76  Pulse: 84 83 61 61  Resp: 18 17 16    Temp: 98.3 F (36.8 C) 97.7 F (36.5 C) 98.1 F (36.7 C)   TempSrc: Oral  Oral   SpO2: 94% 93% 99%   Weight:      Height:        Intake/Output Summary (Last 24 hours) at 05/23/2023 1014 Last data filed at 05/23/2023 8657 Gross per 24 hour  Intake 740 ml  Output 300 ml  Net 440 ml   Filed Weights   05/18/23 1725 05/21/23 1333  Weight: 58.5 kg 57.9 kg    Examination:  General: appears tired and fatigued, typically, I'd expect more improvement after abx treatment for pneumonia Cardiovascular: RRR Lungs: rhonchi worse in R lower lung fields Abdomen: Soft, nontender, nondistended  Neurological: Alert and oriented 3. Moves all extremities 4 with equal strength. Cranial nerves II through XII grossly intact. Extremities: No clubbing or cyanosis. No edema.  Data Reviewed: I have personally reviewed following labs and imaging studies  CBC: Recent Labs  Lab 05/19/23 0534 05/20/23 0429 05/21/23 0804 05/22/23 0452 05/23/23 0454  WBC 16.2* 12.2* 14.7* 14.6* 12.6*  NEUTROABS 12.5* 10.5* 11.8* 12.2* 10.5*  HGB 10.2* 10.1* 10.8* 9.8* 9.9*  HCT 33.3* 31.4* 33.8* 31.1* 32.2*  MCV 111.7* 106.1* 104.0* 107.2* 107.3*  PLT 121* 140* 169 149* 143*    Basic Metabolic Panel: Recent Labs  Lab 05/19/23 0534  05/20/23 0429 05/21/23 0804 05/22/23 0452 05/23/23 0454  NA 138 138 138 139 136  K 4.2 4.0 3.9 4.1 3.9  CL 109 109 110 111 108  CO2 19* 18* 18* 21* 19*  GLUCOSE 79 161* 97 82 88  BUN 28* 31* 35* 32* 33*  CREATININE 1.38* 1.22* 1.29* 1.06* 1.15*  CALCIUM 8.4* 8.6* 8.7* 8.5* 8.4*  MG  --   --   --   --  2.2  PHOS  --   --   --   --  3.2    GFR: Estimated Creatinine Clearance: 33.7 mL/min (Katori Wirsing) (by C-G formula based on SCr of 1.15 mg/dL (H)).  Liver Function Tests: Recent Labs  Lab 05/18/23 1330 05/19/23 0534 05/22/23 0452 05/23/23 0454  AST 40 35 30 41  ALT 55* 45* 38 47*  ALKPHOS 47 51 50 48  BILITOT 1.0 1.1 0.5 0.6  PROT 6.6 6.3* 6.7 6.6  ALBUMIN 2.8* 2.6* 2.5*  2.5*    CBG: Recent Labs  Lab 05/22/23 0725 05/22/23 1137 05/22/23 1630 05/22/23 2016 05/23/23 0730  GLUCAP 97 126* 161* 165* 69*     Recent Results (from the past 240 hours)  Resp panel by RT-PCR (RSV, Flu Ebony Scott&B, Covid) Anterior Nasal Swab     Status: None   Collection Time: 05/18/23  1:05 PM   Specimen: Anterior Nasal Swab  Result Value Ref Range Status   SARS Coronavirus 2 by RT PCR NEGATIVE NEGATIVE Final    Comment: (NOTE) SARS-CoV-2 target nucleic acids are NOT DETECTED.  The SARS-CoV-2 RNA is generally detectable in upper respiratory specimens during the acute phase of infection. The lowest concentration of SARS-CoV-2 viral copies this assay can detect is 138 copies/mL. Ebony Scott negative result does not preclude SARS-Cov-2 infection and should not be used as the sole basis for treatment or other patient management decisions. Ebony Scott negative result may occur with  improper specimen collection/handling, submission of specimen other than nasopharyngeal swab, presence of viral mutation(s) within the areas targeted by this assay, and inadequate number of viral copies(<138 copies/mL). Ebony Scott negative result must be combined with clinical observations, patient history, and epidemiological information. The expected  result is Negative.  Fact Sheet for Patients:  BloggerCourse.com  Fact Sheet for Healthcare Providers:  SeriousBroker.it  This test is no t yet approved or cleared by the [place] FDA and  has been authorized for detection and/or diagnosis of SARS-CoV-2 by FDA under an Emergency Use Authorization (EUA). This EUA will remain  in effect (meaning this test can be used) for the duration of the COVID-19 declaration under Section 564(b)(1) of the Act, 21 U.S.C.section 360bbb-3(b)(1), unless the authorization is terminated  or revoked sooner.       Influenza Ebony Scott by PCR NEGATIVE NEGATIVE Final   Influenza B by PCR NEGATIVE NEGATIVE Final    Comment: (NOTE) The Xpert Xpress SARS-CoV-2/FLU/RSV plus assay is intended as an aid in the diagnosis of influenza from Nasopharyngeal swab specimens and should not be used as [place] sole basis for treatment. Nasal washings and aspirates are unacceptable for Xpert Xpress SARS-CoV-2/FLU/RSV testing.  Fact Sheet for Patients: BloggerCourse.com  Fact Sheet for Healthcare Providers: SeriousBroker.it  This test is not yet approved or cleared by the [place] FDA and has been authorized for detection and/or diagnosis of SARS-CoV-2 by FDA under an Emergency Use Authorization (EUA). This EUA will remain in effect (meaning this test can be used) for the duration of the COVID-19 declaration under Section 564(b)(1) of the Act, 21 U.S.C. section 360bbb-3(b)(1), unless the authorization is terminated or revoked.     Resp Syncytial Virus by PCR NEGATIVE NEGATIVE Final    Comment: (NOTE) Fact Sheet for Patients: BloggerCourse.com  Fact Sheet for Healthcare Providers: SeriousBroker.it  This test is not yet approved or cleared by the [place] FDA and has been authorized for detection and/or diagnosis of  SARS-CoV-2 by FDA under an Emergency Use Authorization (EUA). This EUA will remain in effect (meaning this test can be used) for the duration of the COVID-19 declaration under Section 564(b)(1) of the Act, 21 U.S.C. section 360bbb-3(b)(1), unless the authorization is terminated or revoked.  Performed at [facility], [address] [place], [place] [address]   Blood Culture (routine x 2)     Status: None   Collection Time: 05/18/23  1:12 PM   Specimen: BLOOD RIGHT ARM  Result Value Ref Range Status   Specimen Description   Final  BLOOD RIGHT ARM Performed at [facility], [address] [place], [place] [address]    Special Requests   Final    BOTTLES DRAWN AEROBIC AND ANAEROBIC Blood Culture adequate volume Performed at [facility], [address], [place], [place] [address]    Culture   Final    NO GROWTH 5 DAYS Performed at [facility], [address] [place], [place] [address]    Report Status 05/23/2023 FINAL  Final  Blood Culture (routine x 2)     Status: None   Collection Time: 05/18/23  1:12 PM   Specimen: BLOOD RIGHT ARM  Result Value Ref Range Status   Specimen Description   Final    BLOOD RIGHT ARM Performed at [facility], [address], [place], [place] [address]    Special Requests   Final    BOTTLES DRAWN AEROBIC AND ANAEROBIC Blood Culture adequate volume Performed at [facility], [address] [place], [place] [address]    Culture   Final    NO GROWTH 5 DAYS Performed at [facility], [address] [place], [place] [address]    Report Status 05/23/2023 FINAL  Final  MRSA Next Gen by PCR, Nasal     Status: None   Collection Time: 05/22/23  4:31 PM   Specimen: Nasal Mucosa; Nasal Swab  Result Value Ref Range Status   MRSA by PCR Next Gen NOT DETECTED NOT DETECTED Final    Comment: (NOTE) The GeneXpert MRSA Assay (FDA approved for NASAL specimens only), is  one component of Ebony Scott comprehensive MRSA colonization surveillance program. It is not intended to diagnose MRSA infection nor to guide or monitor treatment for MRSA infections. Test performance is not FDA approved in patients less than 42 years old. Performed at [facility], [address], [place], [place] [address]   Expectorated Sputum Assessment w Gram Stain, Rflx to Resp Cult     Status: None   Collection Time: 05/22/23  5:38 PM   Specimen: Expectorated Sputum  Result Value Ref Range Status   Specimen Description EXPECTORATED SPUTUM  Final   Special Requests NONE NOTIFIED Ebony Lopes, RN AT 2050 05/22/23 RAM  Final   Sputum evaluation   Final    Sputum specimen not acceptable for testing.  Please recollect.   Performed at [facility], [address] [place], [place] [address]    Report Status 05/22/2023 FINAL  Final         Radiology Studies: No results found.      Scheduled Meds:  atorvastatin  80 mg Oral QPM   benzonatate  200 mg Oral BID   clopidogrel  75 mg Oral Daily   clotrimazole  10 mg Oral 5 X Daily   docusate sodium  100 mg Oral BID   empagliflozin  10 mg Oral Daily   fluconazole  150 mg Oral Daily   fluticasone furoate-vilanterol  1 puff Inhalation Daily   And   umeclidinium bromide  1 puff Inhalation Daily   furosemide  40 mg Oral Once per day on Monday Wednesday   insulin aspart  0-5 Units Subcutaneous QHS   insulin aspart  0-9 Units Subcutaneous TID WC   ipratropium-albuterol  3 mL Nebulization BID   isosorbide-hydrALAZINE  1 tablet Oral BID   montelukast  10 mg Oral QPC supper   predniSONE  20 mg Oral Q breakfast   rivaroxaban  20  mg Oral Q supper   saccharomyces boulardii  250 mg Oral BID   sacubitril-valsartan  1 tablet Oral BID   sodium bicarbonate  650 mg Oral TID   Continuous Infusions:  cefTRIAXone (ROCEPHIN)  IV 2 g (05/23/23 0808)     LOS: 5 days    Time spent: over 30 min    Lacretia Nicks, MD [facility]   To contact the attending provider between 7A-7P or the covering provider during after hours 7P-7A, please log into the web site [email or web address] and access using universal [place] password for that web site. If you do not have the password, please call the hospital operator.  05/23/2023, 10:14 AM

## 2023-05-24 ENCOUNTER — Telehealth: Payer: Self-pay | Admitting: Pulmonary Disease

## 2023-05-24 ENCOUNTER — Other Ambulatory Visit (HOSPITAL_COMMUNITY): Payer: Self-pay

## 2023-05-24 ENCOUNTER — Other Ambulatory Visit: Payer: Self-pay

## 2023-05-24 DIAGNOSIS — J9601 Acute respiratory failure with hypoxia: Secondary | ICD-10-CM | POA: Diagnosis not present

## 2023-05-24 DIAGNOSIS — J470 Bronchiectasis with acute lower respiratory infection: Secondary | ICD-10-CM

## 2023-05-24 LAB — COMPREHENSIVE METABOLIC PANEL WITH GFR
ALT: 53 U/L — ABNORMAL HIGH (ref 0–44)
AST: 44 U/L — ABNORMAL HIGH (ref 15–41)
Albumin: 2.4 g/dL — ABNORMAL LOW (ref 3.5–5.0)
Alkaline Phosphatase: 50 U/L (ref 38–126)
Anion gap: 13 (ref 5–15)
BUN: 31 mg/dL — ABNORMAL HIGH (ref 8–23)
CO2: 18 mmol/L — ABNORMAL LOW (ref 22–32)
Calcium: 8.7 mg/dL — ABNORMAL LOW (ref 8.9–10.3)
Chloride: 107 mmol/L (ref 98–111)
Creatinine, Ser: 0.83 mg/dL (ref 0.44–1.00)
GFR, Estimated: >60 mL/min (ref >60)
Glucose, Bld: 124 mg/dL — ABNORMAL HIGH (ref 70–99)
Potassium: 4.3 mmol/L (ref 3.5–5.1)
Sodium: 138 mmol/L (ref 135–145)
Total Bilirubin: 0.6 mg/dL (ref 0.0–1.2)
Total Protein: 6.7 g/dL (ref 6.5–8.1)

## 2023-05-24 LAB — CBC WITH DIFFERENTIAL/PLATELET
Abs Immature Granulocytes: 0.15 10*3/uL — ABNORMAL HIGH (ref 0.00–0.07)
Basophils Absolute: 0 10*3/uL (ref 0.0–0.1)
Basophils Relative: 0 %
Eosinophils Absolute: 0.1 10*3/uL (ref 0.0–0.5)
Eosinophils Relative: 1 %
HCT: 31.4 % — ABNORMAL LOW (ref 36.0–46.0)
Hemoglobin: 10 g/dL — ABNORMAL LOW (ref 12.0–15.0)
Immature Granulocytes: 1 %
Lymphocytes Relative: 8 %
Lymphs Abs: 1 10*3/uL (ref 0.7–4.0)
MCH: 33.9 pg (ref 26.0–34.0)
MCHC: 31.8 g/dL (ref 30.0–36.0)
MCV: 106.4 fL — ABNORMAL HIGH (ref 80.0–100.0)
Monocytes Absolute: 0.7 10*3/uL (ref 0.1–1.0)
Monocytes Relative: 6 %
Neutro Abs: 10.3 10*3/uL — ABNORMAL HIGH (ref 1.7–7.7)
Neutrophils Relative %: 84 %
Platelets: 167 10*3/uL (ref 150–400)
RBC: 2.95 MIL/uL — ABNORMAL LOW (ref 3.87–5.11)
RDW: 18.3 % — ABNORMAL HIGH (ref 11.5–15.5)
WBC: 12.3 10*3/uL — ABNORMAL HIGH (ref 4.0–10.5)
nRBC: 0 % (ref 0.0–0.2)

## 2023-05-24 LAB — GLUCOSE, CAPILLARY: Glucose-Capillary: 90 mg/dL (ref 70–99)

## 2023-05-24 LAB — PHOSPHORUS: Phosphorus: 3.1 mg/dL (ref 2.5–4.6)

## 2023-05-24 LAB — MAGNESIUM: Magnesium: 2.1 mg/dL (ref 1.7–2.4)

## 2023-05-24 LAB — LEGIONELLA PNEUMOPHILA SEROGP 1 UR AG: L. pneumophila Serogp 1 Ur Ag: NEGATIVE

## 2023-05-24 MED ORDER — PREDNISONE 20 MG PO TABS
ORAL_TABLET | ORAL | 0 refills | Status: AC
  Filled 2023-05-24: qty 5, 6d supply, fill #0

## 2023-05-24 MED ORDER — CLOTRIMAZOLE 10 MG MT TROC
10.0000 mg | Freq: Every day | OROMUCOSAL | 0 refills | Status: AC
  Filled 2023-05-24: qty 30, 6d supply, fill #0

## 2023-05-24 MED ORDER — SODIUM CHLORIDE 3 % IN NEBU
INHALATION_SOLUTION | Freq: Two times a day (BID) | RESPIRATORY_TRACT | 0 refills | Status: AC
  Filled 2023-05-24: qty 240, 30d supply, fill #0

## 2023-05-24 MED ORDER — SODIUM BICARBONATE 650 MG PO TABS
650.0000 mg | ORAL_TABLET | Freq: Three times a day (TID) | ORAL | 0 refills | Status: AC
  Filled 2023-05-24: qty 90, 30d supply, fill #0

## 2023-05-24 MED ORDER — NYSTATIN 100000 UNIT/ML MT SUSP
OROMUCOSAL | 0 refills | Status: AC
  Filled 2023-05-24 (×2): qty 150, 30d supply, fill #0
  Filled 2023-06-28: qty 473, 90d supply, fill #0

## 2023-05-24 NOTE — Progress Notes (Signed)
 NAME:  Ebony Scott, MRN:  [id number], DOB:  1950-08-09, LOS: 6 ADMISSION DATE:  05/18/2023, CONSULTATION DATE: 05/23/2023 REFERRING MD: Dr. Lowell Guitar, CHIEF COMPLAINT: Pneumonia  History of Present Illness:  Asked to see patient with recurrent pneumonia Came in with fever, shortness of breath cough with mucus production.  Hypoxemia-pulse oximetry was reading lower than usual Recently treated for pneumonia early February 2025   Does have a history of bronchiectasis, asthma, chronic respiratory failure, obstructive sleep apnea for which she is on CPAP History of rheumatoid arthritis, on chronic prednisone therapy, hypogammaglobulinemia-on replacement. Was treated for pneumonia in January   CT scan showing persistent infiltrate in the right base -Infiltrate may be a little worse than one performed in February 2025  Pertinent  Medical History   Past Medical History:  Diagnosis Date   Anemia    Angio-edema    Breast cancer (HCC) 1998   Left breast, in remission   Bronchiectasis (HCC)    CHF (congestive heart failure) (HCC)    Closed displaced fracture of metatarsal bone of right foot 01/20/2019   Closed fracture of fifth metatarsal bone of right foot, initial encounter 01/15/2018   Clostridium difficile colitis 01/14/2018   Coronary artery disease    Depression    "some; don't take anything for it" (02/20/2012), pt denies dx as of 06/02/21   Diverticulosis    DVT (deep venous thrombosis) (HCC)    Fibromyalgia 11/2011   Fracture of base of fifth metatarsal bone with routine healing, left    GAVE (gastric antral vascular ectasia)    GERD (gastroesophageal reflux disease)    Headache(784.0)    "related to allergies; more at different times during the year" (02/20/2012)   Hemorrhoids    Hiatal hernia    back and neck   History of blood transfusion 2023   1 unit, 5 units of Iron   Hx of adenomatous colonic polyps 04/12/2016   Hypercholesteremia    good cholesterol is high    Hypothyroidism    h/o Graves disease   IBS (irritable bowel syndrome)    Moderate persistent asthma    -FeV1 72% 2011, -IgE 102 2011, CT sinus Neg 2011   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 Casimer Lanius   Sleep apnea    uses cpap nightly   Tracheobronchomalacia    Vasculitis (HCC) 12/13/2017     Significant Hospital Events: Including procedures, antibiotic start and stop dates in addition to other pertinent events   CT chest 05/18/2023-right lower lobe infiltrate compared with CT 03/29/2023  Interim History / Subjective:  Overall feels a little bit better  Objective   Blood pressure (!) 157/71, pulse 62, temperature 98.2 F (36.8 C), temperature source Oral, resp. rate 15, height 5' (1.524 m), weight 57.9 kg, SpO2 94%.        Intake/Output Summary (Last 24 hours) at 05/24/2023 1031 Last data filed at 05/24/2023 1000 Gross per 24 hour  Intake 1170 ml  Output 300 ml  Net 870 ml   Filed Weights   05/18/23 1725 05/21/23 1333  Weight: 58.5 kg 57.9 kg    Examination: General: Elderly, does not appear to be in distress HENT: Moist oral mucosa Lungs: Rales at the right base Cardiovascular: S1-S2 appreciated Abdomen: Soft, bowel  sounds appreciated Extremities: No clubbing, no edema Neuro: Alert and oriented x 3 GU:   Resolved Hospital Problem list     Assessment & Plan:   Multilobar pneumonia - Overall improving - Complete course of antibiotics - May plan for discharge today  Will plan to repeat CT in about 6 weeks and follow-up as outpatient  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

## 2023-05-24 NOTE — Telephone Encounter (Signed)
 Schedule follow-up for bronchiectasis, pneumonia.  Follow-up in 6 weeks To get CT scan of the chest prior to visit

## 2023-05-24 NOTE — Plan of Care (Signed)
Discharge instructions given to the patient and her daughter including medications and follow up.

## 2023-05-24 NOTE — Discharge Summary (Signed)
 Physician Discharge Summary  Ebony Scott ZOX:[id number] DOB: 1950/06/24 DOA: 05/18/2023  PCP: Ebony [name], MD  Admit date: 05/18/2023 Discharge date: 05/24/2023  Time spent: 40 minutes  Recommendations for Outpatient Follow-up:  Follow outpatient CBC/CMP  Follow with pulmonology outpatient Repeat CT scan in 6 weeks Follow metabolic acidosis outpatient  Follow with immunology outpatient    Discharge Diagnoses:  Principal Problem:   RLL pneumonia Active Problems:   Sepsis without acute organ dysfunction (HCC)   Acute respiratory failure with hypoxia (HCC)   Hypogammaglobulinemia (HCC)   Current chronic use of systemic steroids - prednisone 7 mg daily for hx of RA   Acute metabolic acidosis   Chronic systolic CHF (congestive heart failure) (HCC)   Chronic kidney disease, stage 3b (HCC) - baseline Scr 1.3-1.5   Chronic pain syndrome   Rheumatoid arthritis (HCC)   Tracheobronchomalacia   History of pulmonary embolus (PE)   DMII (diabetes mellitus, type 2) (HCC)   DNR (do not resuscitate)   Nocturnal hypoxia - 2-3 L/min   Fever   Discharge Condition: stable  Diet recommendation: heart healthy  Filed Weights   05/18/23 1725 05/21/23 1333  Weight: 58.5 kg 57.9 kg    History of present illness:   73 year old Caucasian female history of seropositive rheumatoid arthritis on chronic prednisone, chronic respiratory failure on oxygen only at night, chronic prednisone use with 7 mg of prednisone Ebony Scott day, prior history of hypogammaglobulinemia, CKD stage IIIb baseline creatinine 1.3-1.5, chronic pain syndrome, history of tracheobronchomalacia, prior history of PE on chronic anticoagulation presents to the ER today with Ebony Scott 1 day history of feeling poorly.  1 day history of fever.  She has had about 4 bouts of pneumonia in the last year.  She was discharged to home during mid February 2025 due to pneumonia.  She completed Ebony Scott 5-day course of Keflex at home.   She presented with recurrent  pneumonia with CT showing multilobar pneumonia.     She's improved gradually on antibiotics.  Seen by pulmonology, planning for outpatient follow up.   See below for additional details.     Hospital Course:  Assessment and Plan:  Sepsis due to Multilobar Pneumonia Acute on Chronic Hypoxic Respiratory Failure Currently on RA (typically on 2 L at night) CT chest with progressive airspace disease within superior segment of RLL and new patchy airspace opacities within the right upper and middle lobes (difficult to tell whether this represents progression of pneumonia 7 weeks ago or recurrent pneumonia -- of note, she reported to me improvement in symptoms after last hospitalization -> suspect this represents recurrent pneumonia in this setting). completed 7 day abx course Blood cultures Ngx4 Negative covid, flu, RSV Follow urine strep, urine legionella, sputum culture.  MRSA PCR. Small volume hemotysis noted, suspect due to pneumonia She looks much improved today, stable for discharge - appreciate pulmonology recs - repeat CT scan in 6 weeks, will send with hypertonic saline -> follow with pulmonology outpatient Sepsis physiology resolved.  Present on admission with hypoxia, leukocytosis and evidence of pneumonia on chest CT.   Metabolic Acidosis  Will monitor, bicarb started, follow outpatient   Rheumatoid Arthritis She's on prednisone chronically Currently on steroid taper, plan to taper gradually back to chronic 7 mg daily   Hypogammaglobulinemia S/p IVIG here on 4/1   T2DM SSI   History Pulmonary Embolus Xarelto    Tracheobronchomalacia Noted   Chronic Pain  Prn gabapentin and tramadol   HFrEF Appears compensated Prior EF 06/2022 30-35%,  grade 1 diastolic dysfunction   Thrush Vaginal Candidiasis Fluconazole x1 for vaginal candidiasis, clotrimazole troche at discharge for thrush     Procedures: none   Consultations: pulmonology  Discharge Exam: Vitals:    05/24/23 0843 05/24/23 0847  BP:    Pulse:    Resp:    Temp:    SpO2: 94% 94%   No complaints Feeling well enough to go home today Discussed d/c plan with daughter  General: No acute distress. Looks brighter/perkier today, less malaise. Cardiovascular: RRR Lungs: Clear to auscultation bilaterally - less apparent rhonchi today Abdomen: Soft, nontender, nondistended Neurological: Alert and oriented 3. Moves all extremities 4 with equal strength. Cranial nerves II through XII grossly intact. Skin: Warm and dry. No rashes or lesions. Extremities: No clubbing or cyanosis. No edema.   Discharge Instructions   Discharge Instructions     (HEART FAILURE PATIENTS) Call MD:  Anytime you have any of the following symptoms: 1) 3 pound weight gain in 24 hours or 5 pounds in 1 week 2) shortness of breath, with or without Ebony Scott dry hacking cough 3) swelling in the hands, feet or stomach 4) if you have to sleep on extra pillows at night in order to breathe.   Complete by: As directed    Call MD for:  difficulty breathing, headache or visual disturbances   Complete by: As directed    Call MD for:  extreme fatigue   Complete by: As directed    Call MD for:  hives   Complete by: As directed    Call MD for:  persistant dizziness or light-headedness   Complete by: As directed    Call MD for:  persistant nausea and vomiting   Complete by: As directed    Call MD for:  redness, tenderness, or signs of infection (pain, swelling, redness, odor or green/yellow discharge around incision site)   Complete by: As directed    Call MD for:  severe uncontrolled pain   Complete by: As directed    Call MD for:  temperature >100.4   Complete by: As directed    Diet - low sodium heart healthy   Complete by: As directed    Discharge instructions   Complete by: As directed    You were seen for pneumonia.  You've improved gradually with antibiotics.  You've completed 7 days of antibiotics and don't need  anymore antibiotics at discharge.  We'll prescribe hypertonic saline to help manage your secretions.  We'll send you with Ebony Scott short taper back to your home 7 mg dose.  You will need to follow with pulmonology outpatient.  You'll need Devin Foskey repeat CT scan in about 6 weeks as an outpatient to ensure resolution of your pneumonia.   For your thrush, we'll prescribe clotrimazole.  You had Khadir Roam metabolic acidosis and we started you on bicarb.  Follow with your PCP outpatient to see if this is something you'll need long term.    We'll arrange home health for you.  Follow up with your outpatient doctors (immunology, pulmonology, etc).  We gave Jerre Diguglielmo dose of your IVIG here on 4/1, resume outpatient based on further immunology recommendations.  Return for new, recurrent, or worsening symptoms.  Please ask your PCP to request records from this hospitalization so they know what was done and what the next steps will be.   Increase activity slowly   Complete by: As directed       Allergies as of 05/24/2023  Reactions   Baclofen Anaphylaxis, Other (See Comments)   Altered mental status requiring Danecia Underdown 3-day hospital stay- patient became unresponsive "Put me in Toshika Parrow coma for five days", Altered mental status requiring Brant Peets 3-day hospital stay- patient became unresponsive   Other Shortness Of Breath, Other (See Comments)   Grass and weeds "sneezing; filled sinuses" (02/20/2012)   Pork-derived Products Anaphylaxis   Shellfish Allergy Anaphylaxis, Shortness Of Breath, Itching, Rash   Shrimp Extract Anaphylaxis, Other (See Comments)   ALL SHELLFISH   Tetracycline Hcl Nausea And Vomiting   Xolair [omalizumab] Other (See Comments)   Caused Blood clot   Zoledronic Acid Other (See Comments)   Reclast- Fever, Put in hospital, dr said it was Tahiri Shareef reaction from Darnette Lampron reaction  Fever, inability to walk, Put in hospital Fever, Put in hospital, dr said it was Eulah Walkup reaction from Saranne Crislip reaction , Other reaction(s): Unknown   Celecoxib Swelling    Feet swelling   Dilaudid [hydromorphone Hcl] Itching   Hydrocodone-acetaminophen Itching   Levofloxacin Other (See Comments)   GI upset   Molds & Smuts Cough   Morphine And Codeine Hives, Itching   Oxycodone Hcl Nausea And Vomiting   GI Intolerance   Paroxetine Nausea And Vomiting, Other (See Comments)   GI Intolerance also   Penicillins Rash, Other (See Comments)   "welts" (02/20/2012) Tolerated Ancef 02/16/20   Tetracycline Hcl Other (See Comments)   GI Intolerance   Oxycodone-acetaminophen Itching   Diltiazem Hcl Swelling   Dust Mite Extract Other (See Comments), Cough   "sneezing" (02/20/2012) too   Gamunex [immune Globulin] Itching, Rash   Tolerates hizentra    Penicillin G Procaine Rash, Other (See Comments)   Welts, also   Rituximab Other (See Comments)   Anemia    Rofecoxib Swelling   Vioxx- feet swelling   Tree Extract Itching   "tested and told I was allergic to it; never experienced Rommie Dunn reaction to it" (02/20/2012)        Medication List     TAKE these medications    acetaminophen 500 MG tablet Commonly known as: TYLENOL Take 1,000 mg by mouth as needed for moderate pain.   albuterol 108 (90 Base) MCG/ACT inhaler Commonly known as: VENTOLIN HFA Inhale 2 puffs into the lungs every 6 (six) hours as needed for wheezing or shortness of breath.   albuterol (2.5 MG/3ML) 0.083% nebulizer solution Commonly known as: PROVENTIL Take 3 mLs (2.5 mg total) by nebulization every 4 (four) hours as needed for wheezing or shortness of breath.   Align 4 MG Caps Take 4 mg by mouth every evening.   allopurinol 100 MG tablet Commonly known as: ZYLOPRIM Take 1 tablet (100 mg total) by mouth daily.   Alpha-Lipoic Acid 600 MG Tabs Take 600 mg by mouth in the morning.   atorvastatin 80 MG tablet Commonly known as: LIPITOR Take 1 tablet (80 mg total) by mouth every evening.   azelastine 0.1 % nasal spray Commonly known as: ASTELIN Place 2 sprays into both nostrils daily  as needed for rhinitis. Use in each nostril as directed   BEANO PO Take 2-3 tablets by mouth 3 (three) times daily as needed (Gas).   cetirizine 10 MG tablet Commonly known as: ZYRTEC Take 1 tablet (10 mg total) by mouth daily as needed for allergies (Can take Zohan Shiflet n extra dose during flare ups.).   CITRACAL CALCIUM+D 600-40-500 MG-MG-UNIT Tb24 Generic drug: Calcium-Magnesium-Vitamin D Take 1 tablet by mouth daily.   clopidogrel 75 MG tablet  Commonly known as: PLAVIX TAKE 1 TABLET BY MOUTH EVERY DAY   clotrimazole 10 MG troche Commonly known as: MYCELEX Take 1 tablet (10 mg total) by mouth 5 (five) times daily for 6 days.   denosumab 60 MG/ML Sosy injection Commonly known as: PROLIA Inject 60 mg into the skin every 6 (six) months.   dexlansoprazole 60 MG capsule Commonly known as: Dexilant Take 1 capsule (60 mg total) by mouth in the morning.   empagliflozin 10 MG Tabs tablet Commonly known as: Jardiance Take 1 tablet (10 mg total) by mouth daily.   Entresto 24-26 MG Generic drug: sacubitril-valsartan TAKE 1 TABLET BY MOUTH TWICE Lorali Khamis DAY   EPINEPHrine 0.3 mg/0.3 mL Soaj injection Commonly known as: EPI-PEN USE AS DIRECTED BY YOUR PHYSICIAN INTRAMUSCULARLY AS NEEDED FOR ANAPHYLAXIS   famotidine 20 MG tablet Commonly known as: PEPCID TAKE 1 TABLET (20 MG) BY MOUTH DAILY BEFORE LUNCH AND1 TABLET BY MOUTH EACH NIGHT AT BEDTIME   folic acid 1 MG tablet Commonly known as: FOLVITE Take 1 tablet (1 mg total) by mouth daily.   furosemide 40 MG tablet Commonly known as: LASIX Take 40 mg by mouth 2 (two) times Graylon Amory week. Monday and Thursday   gabapentin 300 MG capsule Commonly known as: NEURONTIN Take 1 capsule (300 mg total) by mouth at bedtime.   guaiFENesin 600 MG 12 hr tablet Commonly known as: MUCINEX Take 600 mg by mouth 2 (two) times daily.   Hizentra 1 GM/5ML Soln Generic drug: Immune Globulin (Human) Inject 1 g into the skin.   Hizentra 10 GM/50ML Soln Generic  drug: Immune Globulin (Human) Inject 15 g into the skin once Cruzito Standre week. 13 g Infusion   Hizentra 10 GM/50ML Sosy Generic drug: Immune Globulin (Human) INFUSE 13 GRAMS SUBCUTANEOUSLY EVERY Ebony DAYS   ipratropium-albuterol 0.5-2.5 (3) MG/3ML Soln Commonly known as: DUONEB Take 3 mLs by nebulization every 6 (six) hours as needed.   isosorbide-hydrALAZINE 20-37.5 MG tablet Commonly known as: BIDIL TAKE 1 TABLET BY MOUTH TWICE Shonn Farruggia DAY   LACTOSE FAST ACTING RELIEF PO Take 2-3 tablets by mouth 3 (three) times daily before meals. For dairy food or drink   magnesium oxide 400 (240 Mg) MG tablet Commonly known as: MAG-OX Take 400 mg by mouth at bedtime.   montelukast 10 MG tablet Commonly known as: SINGULAIR Take 1 tablet (10 mg total) by mouth daily after supper.   mupirocin ointment 2 % Commonly known as: BACTROBAN Apply 1 Application topically as needed (wounds).   Nebulizer Mask Adult Misc 1 Device by Does not apply route as directed.   nystatin 100000 UNIT/ML suspension Commonly known as: MYCOSTATIN 5 mls swish, gargle, swallow after Trelegy use   potassium chloride 10 MEQ tablet Commonly known as: KLOR-CON Take 10 mEq by mouth See admin instructions. Mondays and Thursdays.   predniSONE 1 MG tablet Commonly known as: DELTASONE Take 2 mg by mouth daily with breakfast. Takes along with 5 mg to total 7mg  What changed:  Another medication with the same name was added. Make sure you understand how and when to take each. Another medication with the same name was changed. Make sure you understand how and when to take each.   predniSONE 5 MG tablet Commonly known as: DELTASONE Take 1 tablet (5 mg total) by mouth daily with breakfast. What changed: additional instructions   predniSONE 20 MG tablet Commonly known as: DELTASONE Take 1 tablet (20 mg total) by mouth daily with breakfast for 3 days, THEN 0.5  tablets (10 mg total) daily with breakfast for 3 days. Then resume your home  7 mg daily dose.. Start taking on: May 25, 2023 What changed: You were already taking Kelia Gibbon medication with the same name, and this prescription was added. Make sure you understand how and when to take each.   PRENATAL VITAMINS PO Take 1 tablet by mouth in the morning.   rivaroxaban 20 MG Tabs tablet Commonly known as: Xarelto Take 1 tablet (20 mg total) by mouth daily with supper.   sodium bicarbonate 650 MG tablet Take 1 tablet (650 mg total) by mouth 3 (three) times daily. Follow your kidney function and labs with your PCP outpatient to determine if this is something you need to continue long term or not.   sodium chloride HYPERTONIC 3 % nebulizer solution Take by nebulization in the morning and at bedtime. To help manage secretions   traMADol 50 MG tablet Commonly known as: ULTRAM Take 1 tablet (50 mg total) by mouth at bedtime.   traZODone 50 MG tablet Commonly known as: DESYREL Take 2 tablets (100 mg total) by mouth at bedtime. What changed: how much to take   Trelegy Ellipta 200-62.5-25 MCG/ACT Aepb Generic drug: Fluticasone-Umeclidin-Vilant INHALE 1 INHALATION INTO THE LUNGS IN THE MORNING.   Vitamin D3 25 MCG (1000 UT) Caps Take 1,000 Units by mouth at bedtime.   Voltaren 1 % Gel Generic drug: diclofenac Sodium Apply 1 Application topically as needed (pain).       Allergies  Allergen Reactions   Baclofen Anaphylaxis and Other (See Comments)    Altered mental status requiring Tayshun Gappa 3-day hospital stay- patient became unresponsive   "Put me in Makenzie Vittorio coma for five days", Altered mental status requiring Rianne Degraaf 3-day hospital stay- patient became unresponsive   Other Shortness Of Breath and Other (See Comments)    Grass and weeds "sneezing; filled sinuses" (02/20/2012)   Pork-Derived Products Anaphylaxis   Shellfish Allergy Anaphylaxis, Shortness Of Breath, Itching and Rash   Shrimp Extract Anaphylaxis and Other (See Comments)    ALL SHELLFISH   Tetracycline Hcl Nausea And  Vomiting   Xolair [Omalizumab] Other (See Comments)    Caused Blood clot   Zoledronic Acid Other (See Comments)    Reclast- Fever, Put in hospital, dr said it was Riaan Toledo reaction from Erin Uecker reaction    Fever, inability to walk, Put in hospital  Fever, Put in hospital, dr said it was Parthenia Tellefsen reaction from Caydon Feasel reaction , Other reaction(s): Unknown   Celecoxib Swelling    Feet swelling   Dilaudid [Hydromorphone Hcl] Itching   Hydrocodone-Acetaminophen Itching   Levofloxacin Other (See Comments)    GI upset   Molds & Smuts Cough   Morphine And Codeine Hives and Itching   Oxycodone Hcl Nausea And Vomiting    GI Intolerance   Paroxetine Nausea And Vomiting and Other (See Comments)    GI Intolerance also   Penicillins Rash and Other (See Comments)    "welts" (02/20/2012)  Tolerated Ancef 02/16/20   Tetracycline Hcl Other (See Comments)    GI Intolerance   Oxycodone-Acetaminophen Itching   Diltiazem Hcl Swelling   Dust Mite Extract Other (See Comments) and Cough    "sneezing" (02/20/2012) too   Gamunex [Immune Globulin] Itching and Rash    Tolerates hizentra    Penicillin G Procaine Rash and Other (See Comments)    Welts, also   Rituximab Other (See Comments)    Anemia    Rofecoxib Swelling    Vioxx-  feet swelling   Tree Extract Itching    "tested and told I was allergic to it; never experienced Jolynne Spurgin reaction to it" (02/20/2012)      The results of significant diagnostics from this hospitalization (including imaging, microbiology, ancillary and laboratory) are listed below for reference.    Significant Diagnostic Studies: CT Chest Wo Contrast Result Date: 05/18/2023 CLINICAL DATA:  Pneumonia, complications suspected. Cough and fever since yesterday. Recent hospitalization for pneumonia. EXAM: CT CHEST WITHOUT CONTRAST TECHNIQUE: Multidetector CT imaging of the chest was performed following the standard protocol without IV contrast. RADIATION DOSE REDUCTION: This exam was performed according to the  departmental dose-optimization program which includes automated exposure control, adjustment of the mA and/or kV according to patient size and/or use of iterative reconstruction technique. COMPARISON:  CT 03/29/2023.  Radiographs 05/18/2023 and 05/02/2023. FINDINGS: Cardiovascular: Diffuse atherosclerosis of the aorta, great vessels and coronary arteries. Left subclavian AICD lead projects to the right ventricular apex. Probable calcifications of the aortic valve. Stable mild cardiac enlargement. No significant pericardial fluid. Mediastinum/Nodes: There are no enlarged mediastinal, hilar or axillary lymph nodes.Mildly prominent subcarinal lymph node, likely reactive. The thyroid gland, trachea and esophagus demonstrate no significant findings. Lungs/Pleura: No pleural effusion or pneumothorax. Motion on the previous study limits comparison. There is progressively dense airspace disease within the superior segment of the right lower lobe there are new patchy airspace opacities within the right upper and middle lobes, suspicious for progressive pneumonia. Mild linear scarring or atelectasis in the left lower lobe. No evidence of left lung infiltrates. Upper abdomen: No significant findings are seen in the upper abdomen status post cholecystectomy. There is no adrenal mass. Aortic atherosclerosis noted. Musculoskeletal/Chest wall: There is no chest wall mass or suspicious osseous finding. Previous right shoulder reverse arthroplasty and cervical fusion. 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 Postsurgical changes in the left breast. IMPRESSION: 1. Progressive airspace disease within the superior segment of the right lower lobe and new patchy airspace opacities within the right upper and middle lobes, suspicious for progressive multilobar pneumonia. It is difficult to tell radiographically whether this represents progression of the pneumonia seen on previous CT from 7  weeks ago or recurrent pneumonia. Correlate clinically. Radiographic follow up recommended. 2. No pleural effusion or pneumothorax. 3.  Aortic Atherosclerosis (ICD10-I70.0). Electronically Signed   By: Carey Bullocks M.D.   On: 05/18/2023 15:45   DG Chest Port 1 View Result Date: 05/18/2023 CLINICAL DATA:  Cough and fever since yesterday. EXAM: PORTABLE CHEST 1 VIEW COMPARISON:  May 02, 2023. FINDINGS: The heart size and mediastinal contours are within normal limits. Both lungs are clear. Stable single lead left-sided defibrillator. Status post right shoulder arthroplasty. Degenerative changes are seen involving the left shoulder. IMPRESSION: No active disease. Electronically Signed   By: Lupita Raider M.D.   On: 05/18/2023 12:55   DG Chest 2 View Result Date: 05/02/2023 CLINICAL DATA:  Follow-up pneumonia EXAM: CHEST - 2 VIEW COMPARISON:  March 31, 2023 FINDINGS: Resolved pulmonary infiltrates. No acute infiltrates consolidations or pulmonary masses. No pleural effusions or reactions Heart and mediastinum normal. No change compared with prior examination postsurgical staples in the left anterior lung. Total right shoulder arthroplasty No change in the left subclavian bipolar ICDF pacemaker device tip of the leads in good position no evidence of discontinuity Electronically Signed   By: Shaaron Adler M.D.   On: 05/02/2023 14:28    Microbiology: Recent Results (from the past 240 hours)  Resp panel by RT-PCR (RSV, Flu Sorrel Cassetta&B, Covid) Anterior Nasal Swab     Status: None   Collection Time: 05/18/23  1:05 PM   Specimen: Anterior Nasal Swab  Result Value Ref Range Status   SARS Coronavirus 2 by RT PCR NEGATIVE NEGATIVE Final    Comment: (NOTE) SARS-CoV-2 target nucleic acids are NOT DETECTED.  The SARS-CoV-2 RNA is generally detectable in upper respiratory specimens during the acute phase of infection. The lowest concentration of SARS-CoV-2 viral copies this assay can detect is 138 copies/mL. Shogo Larkey  negative result does not preclude SARS-Cov-2 infection and should not be used as the sole basis for treatment or other patient management decisions. Omarion Minnehan negative result may occur with  improper specimen collection/handling, submission of specimen other than nasopharyngeal swab, presence of viral mutation(s) within the areas targeted by this assay, and inadequate number of viral copies(<138 copies/mL). Stepahnie Campo negative result must be combined with clinical observations, patient history, and epidemiological information. The expected result is Negative.  Fact Sheet for Patients:  BloggerCourse.com  Fact Sheet for Healthcare Providers:  SeriousBroker.it  This test is no t yet approved or cleared by the [place] FDA and  has been authorized for detection and/or diagnosis of SARS-CoV-2 by FDA under an Emergency Use Authorization (EUA). This EUA will remain  in effect (meaning this test can be used) for the duration of the COVID-19 declaration under Section 564(b)(1) of the Act, 21 U.S.C.section 360bbb-3(b)(1), unless the authorization is terminated  or revoked sooner.       Influenza Chiquetta Langner by PCR NEGATIVE NEGATIVE Final   Influenza B by PCR NEGATIVE NEGATIVE Final    Comment: (NOTE) The Xpert Xpress SARS-CoV-2/FLU/RSV plus assay is intended as an aid in the diagnosis of influenza from Nasopharyngeal swab specimens and should not be used as Kirandeep Fariss sole basis for treatment. Nasal washings and aspirates are unacceptable for Xpert Xpress SARS-CoV-2/FLU/RSV testing.  Fact Sheet for Patients: BloggerCourse.com  Fact Sheet for Healthcare Providers: SeriousBroker.it  This test is not yet approved or cleared by the [place] FDA and has been authorized for detection and/or diagnosis of SARS-CoV-2 by FDA under an Emergency Use Authorization (EUA). This EUA will remain in effect (meaning this test can  be used) for the duration of the COVID-19 declaration under Section 564(b)(1) of the Act, 21 U.S.C. section 360bbb-3(b)(1), unless the authorization is terminated or revoked.     Resp Syncytial Virus by PCR NEGATIVE NEGATIVE Final    Comment: (NOTE) Fact Sheet for Patients: BloggerCourse.com  Fact Sheet for Healthcare Providers: SeriousBroker.it  This test is not yet approved or cleared by the [place] FDA and has been authorized for detection and/or diagnosis of SARS-CoV-2 by FDA under an Emergency Use Authorization (EUA). This EUA will remain in effect (meaning this test can be used) for the duration of the COVID-19 declaration under Section 564(b)(1) of the Act, 21 U.S.C. section 360bbb-3(b)(1), unless the authorization is terminated or revoked.  Performed at [facility], [address] [place], [place] [address]   Blood Culture (routine x 2)     Status: None   Collection Time: 05/18/23  1:12 PM   Specimen: BLOOD RIGHT ARM  Result Value Ref Range Status   Specimen Description   Final    BLOOD RIGHT ARM Performed at [facility], [address], [place], [place] [address]    Special Requests   Final    BOTTLES DRAWN AEROBIC AND ANAEROBIC Blood Culture adequate volume Performed at  [facility], [address] [place], [place] [address]    Culture   Final    NO GROWTH 5 DAYS Performed at [facility], [address], [place], [place] [address]    Report Status 05/23/2023 FINAL  Final  Blood Culture (routine x 2)     Status: None   Collection Time: 05/18/23  1:12 PM   Specimen: BLOOD RIGHT ARM  Result Value Ref Range Status   Specimen Description   Final    BLOOD RIGHT ARM Performed at [facility], [address] [place], [place] [address]    Special Requests   Final    BOTTLES DRAWN AEROBIC AND ANAEROBIC Blood Culture adequate volume Performed at  [facility], [address], [place], [place] [address]    Culture   Final    NO GROWTH 5 DAYS Performed at [facility], [address], [place], [place] [address]    Report Status 05/23/2023 FINAL  Final  MRSA Next Gen by PCR, Nasal     Status: None   Collection Time: 05/22/23  4:31 PM   Specimen: Nasal Mucosa; Nasal Swab  Result Value Ref Range Status   MRSA by PCR Next Gen NOT DETECTED NOT DETECTED Final    Comment: (NOTE) The GeneXpert MRSA Assay (FDA approved for NASAL specimens only), is one component of Shivansh Hardaway comprehensive MRSA colonization surveillance program. It is not intended to diagnose MRSA infection nor to guide or monitor treatment for MRSA infections. Test performance is not FDA approved in patients less than 36 years old. Performed at [facility], [address], [place], [place] [address]   Expectorated Sputum Assessment w Gram Stain, Rflx to Resp Cult     Status: None   Collection Time: 05/22/23  5:38 PM   Specimen: Expectorated Sputum  Result Value Ref Range Status   Specimen Description EXPECTORATED SPUTUM  Final   Special Requests NONE NOTIFIED Donnajean Lopes, RN AT 2050 05/22/23 RAM  Final   Sputum evaluation   Final    Sputum specimen not acceptable for testing.  Please recollect.   Performed at [facility], [address], [place], [place] [address]    Report Status 05/22/2023 FINAL  Final     Labs: Basic Metabolic Panel: Recent Labs  Lab 05/20/23 0429 05/21/23 0804 05/22/23 0452 05/23/23 0454 05/24/23 0433  NA 138 138 139 136 138  K 4.0 3.9 4.1 3.9 4.3  CL 109 110 111 108 107  CO2 18* 18* 21* 19* 18*  GLUCOSE 161* 97 82 88 124*  BUN 31* 35* 32* 33* 31*  CREATININE 1.22* 1.29* 1.06* 1.15* 0.83  CALCIUM 8.6* 8.7* 8.5* 8.4* 8.7*  MG  --   --   --  2.2 2.1  PHOS  --   --   --  3.2 3.1   Liver Function Tests: Recent Labs  Lab 05/18/23 1330 05/19/23 0534 05/22/23 0452  05/23/23 0454 05/24/23 0433  AST 40 35 30 41 44*  ALT 55* 45* 38 47* 53*  ALKPHOS 47 51 50 48 50  BILITOT 1.0 1.1 0.5 0.6 0.6  PROT 6.6 6.3* 6.7 6.6 6.7  ALBUMIN 2.8* 2.6* 2.5* 2.5* 2.4*   No results for input(s): "LIPASE", "AMYLASE" in the last 168 hours. No results for input(s): "AMMONIA" in the last 168 hours. CBC: Recent Labs  Lab 05/20/23 0429 05/21/23 0804 05/22/23 0452 05/23/23 0454 05/24/23 0433  WBC 12.2* 14.7*  14.6* 12.6* 12.3*  NEUTROABS 10.5* 11.8* 12.2* 10.5* 10.3*  HGB 10.1* 10.8* 9.8* 9.9* 10.0*  HCT 31.4* 33.8* 31.1* 32.2* 31.4*  MCV 106.1* 104.0* 107.2* 107.3* 106.4*  PLT 140* 169 149* 143* 167   Cardiac Enzymes: No results for input(s): "CKTOTAL", "CKMB", "CKMBINDEX", "TROPONINI" in the last 168 hours. BNP: BNP (last 3 results) Recent Labs    03/31/23 0508 04/01/23 0428 05/18/23 1331  BNP 1,953.7* 1,916.4* 302.6*    ProBNP (last 3 results) No results for input(s): "PROBNP" in the last 8760 hours.  CBG: Recent Labs  Lab 05/23/23 0730 05/23/23 1149 05/23/23 1629 05/23/23 2029 05/24/23 0727  GLUCAP 69* 139* 150* 204* 90       Signed:  Lacretia Nicks MD.  [facility] 05/24/2023, 10:49 AM

## 2023-05-24 NOTE — TOC Transition Note (Signed)
 Transition of Care [facility]) - Discharge Note   Patient Details  Name: Ebony Scott MRN: [id number] Date of Birth: 1951-01-25  Transition of Care [facility]) CM/SW Contact:  Amada Jupiter, LCSW Phone Number: 05/24/2023, 11:21 AM   Clinical Narrative:     Pt medically cleared for dc home today.  Orders placed for HHPT.  Pt aware and agreeable and has no agency preferences.  Referral placed with [facility] - agency info on AVS.  Pt has needed DME (including O2) already in the home.  No further TOC needs.  Final next level of care: Home w Home Health Services Barriers to Discharge: Barriers Resolved   Patient Goals and CMS Choice Patient states their goals for this hospitalization and ongoing recovery are:: home CMS Medicare.gov Compare Post Acute Care list provided to:: Patient   [place] ownership interest in [facility].provided to:: Patient    Discharge Placement                       Discharge Plan and Services Additional resources added to the After Visit Summary for     Discharge Planning Services: CM Consult            DME Arranged: N/A DME Agency: NA       HH Arranged: PT HH Agency: [facility] Date HH Agency Contacted: 05/24/23 Time HH Agency Contacted: 1121 Representative spoke with at [facility] Agency: Haywood Lasso  Social Drivers of Health (SDOH) Interventions SDOH Screenings   Food Insecurity: No Food Insecurity (05/20/2023)  Housing: Low Risk  (05/20/2023)  Transportation Needs: No Transportation Needs (05/20/2023)  Utilities: Not At Risk (05/20/2023)  Alcohol Screen: Low Risk  (11/22/2022)  Depression (PHQ2-9): Low Risk  (01/21/2023)  Financial Resource Strain: Low Risk  (03/26/2023)  Physical Activity: Inactive (03/26/2023)  Social Connections: Socially Isolated (05/18/2023)  Stress: Stress Concern Present (03/26/2023)  Tobacco Use: Low Risk  (05/18/2023)  Health Literacy: Adequate Health Literacy (11/22/2022)     Readmission Risk Interventions     05/20/2023   12:38 PM 12/06/2022    2:19 PM 12/03/2022    3:04 PM  Readmission Risk Prevention Plan  Transportation Screening Complete Complete Complete  PCP or Specialist Appt within 3-5 Days  Complete Complete  HRI or Home Care Consult  Complete Complete  Social Work Consult for Recovery Care Planning/Counseling  Complete Complete  Palliative Care Screening  Complete Complete  Medication Review Ocean[employer]) Complete Complete Complete  PCP or Specialist appointment within 3-5 days of discharge Complete    HRI or Home Care Consult Complete    SW Recovery Care/Counseling Consult Complete    Palliative Care Screening Not Applicable    Skilled Nursing Facility Complete

## 2023-05-24 NOTE — Progress Notes (Signed)
 Mobility Specialist - Progress Note  (RA) Pre-mobility: 74 bpm HR, 91% SpO2 During mobility: 91 bpm HR, 92% SpO2 Post-mobility: 89 bpm HR, 94% SPO2   05/24/23 0911  Mobility  Activity Ambulated with assistance in hallway  Level of Assistance Contact guard assist, steadying assist  Assistive Device Front wheel walker  Distance Ambulated (ft) 120 ft  Range of Motion/Exercises Active  Activity Response Tolerated well  Mobility Referral Yes  Mobility visit 1 Mobility  Mobility Specialist Start Time (ACUTE ONLY) [id number]  Mobility Specialist Stop Time (ACUTE ONLY) 0911  Mobility Specialist Time Calculation (min) (ACUTE ONLY) 16 min   Pt was found in bed and agreeable to ambulate. No complaints with session. At EOS returned to recliner chair with all needs met. Call bell in reach and chair alarm on.  Billey Chang Mobility Specialist

## 2023-05-24 NOTE — Progress Notes (Signed)
 Discharge mediations givenn to Olevia Perches RN

## 2023-05-25 DIAGNOSIS — J470 Bronchiectasis with acute lower respiratory infection: Secondary | ICD-10-CM | POA: Diagnosis not present

## 2023-05-25 DIAGNOSIS — E1122 Type 2 diabetes mellitus with diabetic chronic kidney disease: Secondary | ICD-10-CM | POA: Diagnosis not present

## 2023-05-25 DIAGNOSIS — J189 Pneumonia, unspecified organism: Secondary | ICD-10-CM | POA: Diagnosis not present

## 2023-05-25 DIAGNOSIS — I5022 Chronic systolic (congestive) heart failure: Secondary | ICD-10-CM | POA: Diagnosis not present

## 2023-05-25 DIAGNOSIS — J9611 Chronic respiratory failure with hypoxia: Secondary | ICD-10-CM | POA: Diagnosis not present

## 2023-05-25 DIAGNOSIS — J4550 Severe persistent asthma, uncomplicated: Secondary | ICD-10-CM | POA: Diagnosis not present

## 2023-05-25 DIAGNOSIS — I130 Hypertensive heart and chronic kidney disease with heart failure and stage 1 through stage 4 chronic kidney disease, or unspecified chronic kidney disease: Secondary | ICD-10-CM | POA: Diagnosis not present

## 2023-05-25 DIAGNOSIS — R6520 Severe sepsis without septic shock: Secondary | ICD-10-CM | POA: Diagnosis not present

## 2023-05-25 DIAGNOSIS — A419 Sepsis, unspecified organism: Secondary | ICD-10-CM | POA: Diagnosis not present

## 2023-05-26 DIAGNOSIS — J4551 Severe persistent asthma with (acute) exacerbation: Secondary | ICD-10-CM | POA: Diagnosis not present

## 2023-05-27 ENCOUNTER — Other Ambulatory Visit (HOSPITAL_COMMUNITY): Payer: Self-pay

## 2023-05-27 ENCOUNTER — Telehealth: Payer: Self-pay | Admitting: *Deleted

## 2023-05-27 ENCOUNTER — Telehealth: Payer: Self-pay | Admitting: Allergy and Immunology

## 2023-05-27 DIAGNOSIS — I5022 Chronic systolic (congestive) heart failure: Secondary | ICD-10-CM | POA: Diagnosis not present

## 2023-05-27 DIAGNOSIS — A419 Sepsis, unspecified organism: Secondary | ICD-10-CM | POA: Diagnosis not present

## 2023-05-27 DIAGNOSIS — I130 Hypertensive heart and chronic kidney disease with heart failure and stage 1 through stage 4 chronic kidney disease, or unspecified chronic kidney disease: Secondary | ICD-10-CM | POA: Diagnosis not present

## 2023-05-27 DIAGNOSIS — R6520 Severe sepsis without septic shock: Secondary | ICD-10-CM | POA: Diagnosis not present

## 2023-05-27 DIAGNOSIS — J189 Pneumonia, unspecified organism: Secondary | ICD-10-CM | POA: Diagnosis not present

## 2023-05-27 DIAGNOSIS — J470 Bronchiectasis with acute lower respiratory infection: Secondary | ICD-10-CM | POA: Diagnosis not present

## 2023-05-27 DIAGNOSIS — E1122 Type 2 diabetes mellitus with diabetic chronic kidney disease: Secondary | ICD-10-CM | POA: Diagnosis not present

## 2023-05-27 DIAGNOSIS — J9611 Chronic respiratory failure with hypoxia: Secondary | ICD-10-CM | POA: Diagnosis not present

## 2023-05-27 DIAGNOSIS — J4550 Severe persistent asthma, uncomplicated: Secondary | ICD-10-CM | POA: Diagnosis not present

## 2023-05-27 NOTE — Transitions of Care (Post Inpatient/ED Visit) (Signed)
   05/27/2023  Name: Ebony Scott MRN: [id number] DOB: 09/02/1950  Today's TOC FU Call Status: Today's TOC FU Call Status:: Unsuccessful Call (1st Attempt) Unsuccessful Call (1st Attempt) Date: 05/27/23  Attempted to reach the patient regarding the most recent Inpatient/ED visit.  Follow Up Plan: Additional outreach attempts will be made to reach the patient to complete the Transitions of Care (Post Inpatient/ED visit) call.   Irving Shows [facility], BSN RN Care Manager/ Transition of Care [place]/ [facility] [phone]

## 2023-05-27 NOTE — Telephone Encounter (Signed)
 [facility] pharmacy called stating they need resumption of care orders for her Hizentra injection.

## 2023-05-28 ENCOUNTER — Ambulatory Visit (INDEPENDENT_AMBULATORY_CARE_PROVIDER_SITE_OTHER): Payer: Medicare PPO

## 2023-05-28 ENCOUNTER — Telehealth: Payer: Self-pay | Admitting: *Deleted

## 2023-05-28 ENCOUNTER — Other Ambulatory Visit (HOSPITAL_COMMUNITY): Payer: Self-pay

## 2023-05-28 DIAGNOSIS — I429 Cardiomyopathy, unspecified: Secondary | ICD-10-CM | POA: Diagnosis not present

## 2023-05-28 NOTE — Transitions of Care (Post Inpatient/ED Visit) (Signed)
   05/28/2023  Name: Ebony Scott MRN: [id number] DOB: 17-Jan-1951  Today's TOC FU Call Status: Today's TOC FU Call Status:: Unsuccessful Call (2nd Attempt) Unsuccessful Call (2nd Attempt) Date: 05/28/23  Attempted to reach the patient regarding the most recent Inpatient/ED visit.  Follow Up Plan: Additional outreach attempts will be made to reach the patient to complete the Transitions of Care (Post Inpatient/ED visit) call.   Irving Shows [facility], BSN RN Care Manager/ Transition of Care [place]/ [facility] [phone]

## 2023-05-29 ENCOUNTER — Telehealth: Payer: Self-pay | Admitting: *Deleted

## 2023-05-29 DIAGNOSIS — E1122 Type 2 diabetes mellitus with diabetic chronic kidney disease: Secondary | ICD-10-CM | POA: Diagnosis not present

## 2023-05-29 DIAGNOSIS — A419 Sepsis, unspecified organism: Secondary | ICD-10-CM | POA: Diagnosis not present

## 2023-05-29 DIAGNOSIS — I130 Hypertensive heart and chronic kidney disease with heart failure and stage 1 through stage 4 chronic kidney disease, or unspecified chronic kidney disease: Secondary | ICD-10-CM | POA: Diagnosis not present

## 2023-05-29 DIAGNOSIS — I5022 Chronic systolic (congestive) heart failure: Secondary | ICD-10-CM | POA: Diagnosis not present

## 2023-05-29 DIAGNOSIS — J189 Pneumonia, unspecified organism: Secondary | ICD-10-CM | POA: Diagnosis not present

## 2023-05-29 DIAGNOSIS — R6520 Severe sepsis without septic shock: Secondary | ICD-10-CM | POA: Diagnosis not present

## 2023-05-29 DIAGNOSIS — J470 Bronchiectasis with acute lower respiratory infection: Secondary | ICD-10-CM | POA: Diagnosis not present

## 2023-05-29 DIAGNOSIS — J9611 Chronic respiratory failure with hypoxia: Secondary | ICD-10-CM | POA: Diagnosis not present

## 2023-05-29 DIAGNOSIS — J4550 Severe persistent asthma, uncomplicated: Secondary | ICD-10-CM | POA: Diagnosis not present

## 2023-05-29 LAB — CUP PACEART REMOTE DEVICE CHECK
Battery Remaining Longevity: 115 mo
Battery Remaining Percentage: 92 %
Battery Voltage: 3.02 V
Brady Statistic RV Percent Paced: 1 %
Date Time Interrogation Session: 20250408030202
HighPow Impedance: 86 Ohm
Implantable Lead Connection Status: 753985
Implantable Lead Implant Date: 20240708
Implantable Lead Location: 753860
Implantable Pulse Generator Implant Date: 20240708
Lead Channel Impedance Value: 530 Ohm
Lead Channel Pacing Threshold Amplitude: 0.75 V
Lead Channel Pacing Threshold Pulse Width: 0.5 ms
Lead Channel Sensing Intrinsic Amplitude: 12 mV
Lead Channel Setting Pacing Amplitude: 2.5 V
Lead Channel Setting Pacing Pulse Width: 0.5 ms
Lead Channel Setting Sensing Sensitivity: 0.5 mV
Pulse Gen Serial Number: 211016307
Zone Setting Status: 755011

## 2023-05-29 NOTE — Telephone Encounter (Signed)
Working on same

## 2023-05-29 NOTE — Telephone Encounter (Signed)
 [facility] pharmacy called again  stating they need resumption of care orders for her Hizentra injection.

## 2023-05-29 NOTE — Transitions of Care (Post Inpatient/ED Visit) (Signed)
 05/29/2023  Name: Ebony Scott MRN: [id number] DOB: 1950-03-09  Today's TOC FU Call Status: Today's TOC FU Call Status:: Successful TOC FU Call Completed TOC FU Call Complete Date: 05/29/23 Patient's Name and Date of Birth confirmed.  Transition Care Management Follow-up Telephone Call Date of Discharge: 05/24/23 Discharge Facility: [facility]) Type of Discharge: Inpatient Admission Primary Inpatient Discharge Diagnosis:: RLL pneumonia How have you been since you were released from the hospital?: Better Any questions or concerns?: Yes Patient Questions/Concerns:: patient has not made f/u appointment with PCP Patient Questions/Concerns Addressed: Other: (Careguide scheddule appointment. RN contacted lab to move appointment up so patient could get labs when leaving Dr visit)  Items Reviewed: Did you receive and understand the discharge instructions provided?: Yes Medications obtained,verified, and reconciled?: Yes (Medications Reviewed) Any new allergies since your discharge?: No Dietary orders reviewed?: No Do you have support at home?: Yes People in Home [RPT]: child(ren), adult Name of Support/Comfort Primary Source: [name]  Medications Reviewed Today: Medications Reviewed Today     Reviewed by Luella Cook, RN (Case Manager) on 05/29/23 at 1103  Med List Status: <None>   Medication Order Taking? Sig Documenting Provider Last Dose Status Informant  acetaminophen (TYLENOL) 500 MG tablet [id number]  Take 1,000 mg by mouth as needed for moderate pain. [provider]  Active Child  albuterol (PROVENTIL) (2.5 MG/3ML) 0.083% nebulizer solution [id number] Yes Take 3 mLs (2.5 mg total) by nebulization every 4 (four) hours as needed for wheezing or shortness of breath. Kozlow, [name], MD Taking Active Child           Med Note (CRUTHIS, CHLOE C   Sat May 18, 2023  2:54 PM)    albuterol (VENTOLIN HFA) 108 (90 Base) MCG/ACT inhaler [id number] Yes Inhale 2 puffs into the  lungs every 6 (six) hours as needed for wheezing or shortness of breath. Kozlow, [name], MD Taking Active Child  allopurinol (ZYLOPRIM) 100 MG tablet [id number] Yes Take 1 tablet (100 mg total) by mouth daily. [name], MD Taking Active Child  Alpha-D-Galactosidase [facility] PO) [id number] Yes Take 2-3 tablets by mouth 3 (three) times daily as needed (Gas). [provider] Taking Active Child  Alpha-Lipoic Acid 600 MG TABS [id number] Yes Take 600 mg by mouth in the morning. [provider] Taking Active Child  atorvastatin (LIPITOR) 80 MG tablet [id number] Yes Take 1 tablet (80 mg total) by mouth every evening. [name], MD Taking Active Child  azelastine (ASTELIN) 0.1 % nasal spray [id number] Yes Place 2 sprays into both nostrils daily as needed for rhinitis. Use in each nostril as directed Kozlow, [name], MD Taking Active Child  Calcium-Magnesium-Vitamin D [facility] CALCIUM+D) 600-40-500 MG-MG-UNIT TB24 [id number]  Take 1 tablet by mouth daily. [provider]  Active Child  cetirizine (ZYRTEC) 10 MG tablet [id number]  Take 1 tablet (10 mg total) by mouth daily as needed for allergies (Can take a n extra dose during flare ups.). Kozlow, [name], MD  Active Child  Cholecalciferol (VITAMIN D3) 25 MCG (1000 UT) capsule [id number]  Take 1,000 Units by mouth at bedtime. [provider]  Active Child  clopidogrel (PLAVIX) 75 MG tablet [id number]  TAKE 1 TABLET BY MOUTH EVERY DAY Jodelle Red, MD  Active Child  clotrimazole [facility]) 10 MG troche [id number] Yes Take 1 tablet (10 mg total) by mouth 5 (five) times daily for 6 days. Zigmund Daniel., MD Taking Active   denosumab (PROLIA) 60 MG/ML SOSY  injection [id number] Yes Inject 60 mg into the skin every 6 (six) months. [provider] Taking Active Child           Med Note (CRUTHIS, CHLOE C   Sat May 18, 2023  2:54 PM)    dexlansoprazole (DEXILANT) 60 MG capsule [id number] Yes Take 1 capsule  (60 mg total) by mouth in the morning. Kozlow, [name], MD Taking Active Child  diclofenac Sodium (VOLTAREN) 1 % GEL [id number]  Apply 1 Application topically as needed (pain). [provider]  Active Child           Med Note Charmayne Sheer, SUSAN A   Fri Mar 29, 2023  9:04 PM)    empagliflozin (JARDIANCE) 10 MG TABS tablet [id number] Yes Take 1 tablet (10 mg total) by mouth daily. [name], MD Taking Active Child  ENTRESTO 24-26 [place] [id number] Yes TAKE 1 TABLET BY MOUTH TWICE A DAY Jodelle Red, MD Taking Active Child  EPINEPHRINE 0.3 mg/0.3 mL IJ SOAJ injection [id number]  USE AS DIRECTED BY YOUR PHYSICIAN INTRAMUSCULARLY AS NEEDED FOR ANAPHYLAXIS Kozlow, [name], MD  Active Child           Med Note Lenor Derrick   Sun May 19, 2023  4:44 PM) Never used  famotidine (PEPCID) 20 MG tablet [id number] Yes TAKE 1 TABLET (20 MG) BY MOUTH DAILY BEFORE LUNCH AND1 TABLET BY MOUTH EACH NIGHT AT BEDTIME Kozlow, [name], MD Taking Active Child  Fluticasone-Umeclidin-Vilant (TRELEGY ELLIPTA) 200-62.5-25 MCG/ACT AEPB [id number] Yes INHALE 1 INHALATION INTO THE LUNGS IN THE MORNING. Kozlow, [name], MD Taking Active Child  folic acid (FOLVITE) 1 MG tablet [id number] Yes Take 1 tablet (1 mg total) by mouth daily. [name], MD Taking Active Child  furosemide (LASIX) 40 MG tablet [id number] Yes Take 40 mg by mouth 2 (two) times a week. Monday and Thursday [provider] Taking Active Child  gabapentin (NEURONTIN) 300 MG capsule [id number] Yes Take 1 capsule (300 mg total) by mouth at bedtime. Lewie Chamber, MD Taking Active Child           Med Note [facility] Sherlon Handing, [place] A   Fri Mar 29, 2023  9:08 PM)    guaiFENesin (MUCINEX) 600 MG 12 hr tablet [id number] Yes Take 600 mg by mouth 2 (two) times daily. [provider] Taking Active Child  HIZENTRA 10 GM/50ML SOLN [id number] Yes Inject 15 g into the skin once a week. 13 g Infusion Kozlow, [name], MD Taking Active Child   Immune Globulin, Human, (HIZENTRA) 1 GM/5ML SOLN [id number] Yes Inject 1 g into the skin. [provider] Taking Active Child  Immune Globulin, Human, (HIZENTRA) 10 GM/50ML SOSY [id number] No INFUSE 13 GRAMS SUBCUTANEOUSLY EVERY SEVEN DAYS  Patient not taking: No sig reported   Kozlow, [name], MD Not Taking Active Child  ipratropium-albuterol (DUONEB) 0.5-2.5 (3) MG/3ML nebulizer solution 3 mL [id number]   Martina Sinner, MD  Active   ipratropium-albuterol (DUONEB) 0.5-2.5 (3) MG/3ML SOLN [id number] Yes Take 3 mLs by nebulization every 6 (six) hours as needed. Martina Sinner, MD Taking Active Child  isosorbide-hydrALAZINE (BIDIL) 20-37.5 MG tablet [id number] Yes TAKE 1 TABLET BY MOUTH TWICE A DAY Jodelle Red, MD Taking Active Child  Lactase (LACTOSE FAST ACTING RELIEF PO) [id number] Yes Take 2-3 tablets by mouth 3 (three) times daily before meals. For dairy food or drink [provider] Taking Active Child  magnesium oxide (MAG-OX) 400 (240 Mg) MG tablet [id number] Yes Take  400 mg by mouth at bedtime. [provider] Taking Active Child  montelukast (SINGULAIR) 10 MG tablet [id number] Yes Take 1 tablet (10 mg total) by mouth daily after supper. [name], MD Taking Active Child  mupirocin ointment (BACTROBAN) 2 % [id number] Yes Apply 1 Application topically as needed (wounds). Kozlow, [name], MD Taking Active Child  nystatin (MYCOSTATIN) 100000 UNIT/ML suspension [id number] Yes Swish, gargle, and swallow 5 ml after Trelegy use Zigmund Daniel., MD Taking Active   potassium chloride (KLOR-CON) 10 MEQ tablet [id number] Yes Take 10 mEq by mouth See admin instructions. Mondays and Thursdays. [provider] Taking Active Child  predniSONE (DELTASONE) 1 MG tablet [id number] Yes Take 2 mg by mouth daily with breakfast. Takes along with 5 mg to total 7mg  [provider] Taking Active Child  predniSONE (DELTASONE) 20 MG tablet [id number] Yes  Take 1 tablet (20 mg total) by mouth daily with breakfast for 3 days, THEN 0.5 tablets (10 mg total) daily with breakfast for 3 days. Then resume your home 7 mg daily dose.Marland Kitchen Zigmund Daniel., MD Taking Active   predniSONE (DELTASONE) 5 MG tablet [id number] Yes Take 1 tablet (5 mg total) by mouth daily with breakfast.  Patient taking differently: Take 5 mg by mouth daily with breakfast. Takes along with 1 mg to total 7 mg   Fuller Plan, MD Taking Active Child  Prenatal Vit-Fe Fumarate-FA (PRENATAL VITAMINS PO) [id number] Yes Take 1 tablet by mouth in the morning. [provider] Taking Active Child  Probiotic Product (ALIGN) 4 MG CAPS [id number] Yes Take 4 mg by mouth every evening. [provider] Taking Active Child  Respiratory Therapy Supplies (NEBULIZER MASK ADULT) MISC [id number] Yes 1 Device by Does not apply route as directed. Kozlow, [name], MD Taking Active Child  rivaroxaban (XARELTO) 20 MG TABS tablet [id number] Yes Take 1 tablet (20 mg total) by mouth daily with supper. [name], MD Taking Active Child  sodium bicarbonate 650 MG tablet [id number] Yes Take 1 tablet (650 mg total) by mouth 3 (three) times daily. Follow your kidney function and labs with your PCP outpatient to determine if this is something you need to continue long term or not. Zigmund Daniel., MD Taking Active   sodium chloride HYPERTONIC 3 % nebulizer solution [id number] Yes Take by nebulization in the morning and at bedtime. To help manage secretions Zigmund Daniel., MD Taking Active   traMADol Janean Sark) 50 MG tablet [id number] Yes Take 1 tablet (50 mg total) by mouth at bedtime. [name], MD Taking Active Child  traZODone (DESYREL) 50 MG tablet [id number] Yes Take 2 tablets (100 mg total) by mouth at bedtime.  Patient taking differently: Take 50 mg by mouth at bedtime.   [name], MD Taking Active Child  Med List Note Geri Seminole, CPhT 01/04/21 1640):               Home Care and Equipment/Supplies: Were Home Health Services Ordered?: Yes Name of Home Health Agency:: [facility] Has Agency set up a time to come to your home?: No EMR reviewed for Home Health Orders: Orders present/patient has not received call (refer to CM for follow-up) Any new equipment or medical supplies ordered?: NA  Functional Questionnaire: Do you need assistance with bathing/showering or dressing?: No Do you need assistance with meal preparation?: No Do you need assistance with eating?: No Do you have difficulty maintaining continence: No Do you have difficulty  managing or taking your medications?: No  Follow up appointments reviewed: PCP Follow-up appointment confirmed?: Yes Date of PCP follow-up appointment?: 06/03/23 Follow-up Provider: Thayer Jew Follow-up appointment confirmed?: Yes Date of Specialist follow-up appointment?: 06/18/23 Follow-Up Specialty Provider:: Dr Lucie Leather allergist Do you need transportation to your follow-up appointment?: No (Patient stated she would arrange) Do you understand care options if your condition(s) worsen?: Yes-patient verbalized understanding  SDOH Interventions Today    Flowsheet Row Most Recent Value  SDOH Interventions   Food Insecurity Interventions Intervention Not Indicated  Housing Interventions Intervention Not Indicated  Transportation Interventions Intervention Not Indicated, Patient Resources (Friends/Family)  Utilities Interventions Intervention Not Indicated       Goals Addressed             This Visit's Progress    VBCI Transitions of Care (TOC) Care Plan       Problems:  Recent Hospitalization for treatment of Right lower lobe pneumonia No Hospital Follow Up Provider appointment Care Guide made appointment [id number] 10:00  Goal:  Over the next 30 days, the patient will not experience hospital readmission  Interventions:  Transitions of Care:  New goal. Labs RN  contacted lab to make appointment available when patient went for PCP follow up Doctor Visits  - discussed the importance of doctor visits Arranged PCP follow-up within 12-14 days (Care Guide Scheduled)  Patient Self Care Activities:  Attend all scheduled provider appointments Call pharmacy for medication refills 3-7 days in advance of running out of medications Call provider office for new concerns or questions  Take medications as prescribed    Plan:  Next PCP appointment scheduled for:  No further follow up required: Per patient        Goals Addressed             This Visit's Progress    VBCI Transitions of Care (TOC) Care Plan       Problems:  Recent Hospitalization for treatment of Right lower lobe pneumonia No Hospital Follow Up Provider appointment Care Guide made appointment [id number] 10:00  Goal:  Over the next 30 days, the patient will not experience hospital readmission  Interventions:  Transitions of Care:  New goal. Labs RN contacted lab to make appointment available when patient went for PCP follow up Doctor Visits  - discussed the importance of doctor visits Arranged PCP follow-up within 12-14 days (Care Guide Scheduled)  Patient Self Care Activities:  Attend all scheduled provider appointments Call pharmacy for medication refills 3-7 days in advance of running out of medications Call provider office for new concerns or questions  Take medications as prescribed    Plan:  Next PCP appointment scheduled for:  No further follow up required: Per patient       Patient declined TOC follow up outreach services Gean Maidens BSN RN [facility] Health Care Management Coordinator [email or web address] Direct Dial: [phone]  Fax: [phone] Website: [email or web address]

## 2023-05-30 DIAGNOSIS — R6520 Severe sepsis without septic shock: Secondary | ICD-10-CM | POA: Diagnosis not present

## 2023-05-30 DIAGNOSIS — A419 Sepsis, unspecified organism: Secondary | ICD-10-CM | POA: Diagnosis not present

## 2023-05-30 DIAGNOSIS — J189 Pneumonia, unspecified organism: Secondary | ICD-10-CM | POA: Diagnosis not present

## 2023-05-30 DIAGNOSIS — I130 Hypertensive heart and chronic kidney disease with heart failure and stage 1 through stage 4 chronic kidney disease, or unspecified chronic kidney disease: Secondary | ICD-10-CM | POA: Diagnosis not present

## 2023-05-30 DIAGNOSIS — J470 Bronchiectasis with acute lower respiratory infection: Secondary | ICD-10-CM | POA: Diagnosis not present

## 2023-05-30 DIAGNOSIS — I5022 Chronic systolic (congestive) heart failure: Secondary | ICD-10-CM | POA: Diagnosis not present

## 2023-05-30 DIAGNOSIS — J9611 Chronic respiratory failure with hypoxia: Secondary | ICD-10-CM | POA: Diagnosis not present

## 2023-05-30 DIAGNOSIS — J4550 Severe persistent asthma, uncomplicated: Secondary | ICD-10-CM | POA: Diagnosis not present

## 2023-05-30 DIAGNOSIS — E1122 Type 2 diabetes mellitus with diabetic chronic kidney disease: Secondary | ICD-10-CM | POA: Diagnosis not present

## 2023-05-31 ENCOUNTER — Telehealth: Payer: Self-pay

## 2023-05-31 NOTE — Telephone Encounter (Signed)
 Copied from CRM [id number]. Topic: Clinical - Request for Lab/Test Order >> May 31, 2023  2:51 PM Ebony Scott wrote: Reason for CRM: Pt requesting to have labs completed during her 06/03/2023 appt.

## 2023-06-03 ENCOUNTER — Other Ambulatory Visit

## 2023-06-03 ENCOUNTER — Ambulatory Visit: Admitting: Family Medicine

## 2023-06-03 ENCOUNTER — Encounter: Payer: Self-pay | Admitting: Family Medicine

## 2023-06-03 VITALS — BP 110/46 | HR 72 | Temp 98.6°F | Ht 60.0 in | Wt 133.0 lb

## 2023-06-03 DIAGNOSIS — D849 Immunodeficiency, unspecified: Secondary | ICD-10-CM

## 2023-06-03 DIAGNOSIS — D801 Nonfamilial hypogammaglobulinemia: Secondary | ICD-10-CM

## 2023-06-03 DIAGNOSIS — J189 Pneumonia, unspecified organism: Secondary | ICD-10-CM | POA: Diagnosis not present

## 2023-06-03 NOTE — Progress Notes (Signed)
 Subjective:    Patient ID: Ebony Scott, female    DOB: Mar 27, 1950, 73 y.o.   MRN: [id number] Admit date: 05/18/2023 Discharge date: 05/24/2023   Time spent: 40 minutes   Recommendations for Outpatient Follow-up:  Follow outpatient CBC/CMP  Follow with pulmonology outpatient Repeat CT scan in 6 weeks Follow metabolic acidosis outpatient  Follow with immunology outpatient     Discharge Diagnoses:  Principal Problem:   RLL pneumonia Active Problems:   Sepsis without acute organ dysfunction (HCC)   Acute respiratory failure with hypoxia (HCC)   Hypogammaglobulinemia (HCC)   Current chronic use of systemic steroids - prednisone 7 mg daily for hx of RA   Acute metabolic acidosis   Chronic systolic CHF (congestive heart failure) (HCC)   Chronic kidney disease, stage 3b (HCC) - baseline Scr 1.3-1.5   Chronic pain syndrome   Rheumatoid arthritis (HCC)   Tracheobronchomalacia   History of pulmonary embolus (PE)   DMII (diabetes mellitus, type 2) (HCC)   DNR (do not resuscitate)   Nocturnal hypoxia - 2-3 L/min   Fever     Discharge Condition: stable   Diet recommendation: heart healthy       Filed Weights    05/18/23 1725 05/21/23 1333  Weight: 58.5 kg 57.9 kg      History of present illness:    73 year old Caucasian female history of seropositive rheumatoid arthritis on chronic prednisone, chronic respiratory failure on oxygen only at night, chronic prednisone use with 7 mg of prednisone a day, prior history of hypogammaglobulinemia, CKD stage IIIb baseline creatinine 1.3-1.5, chronic pain syndrome, history of tracheobronchomalacia, prior history of PE on chronic anticoagulation presents to the ER today with a 1 day history of feeling poorly.  1 day history of fever.  She has had about 4 bouts of pneumonia in the last year.  She was discharged to home during mid February 2025 due to pneumonia.  She completed a 5-day course of Keflex at home.   She presented with recurrent  pneumonia with CT showing multilobar pneumonia.     She's improved gradually on antibiotics.  Seen by pulmonology, planning for outpatient follow up.    See below for additional details.       Hospital Course:  Assessment and Plan:   Sepsis due to Multilobar Pneumonia Acute on Chronic Hypoxic Respiratory Failure Currently on RA (typically on 2 L at night) CT chest with progressive airspace disease within superior segment of RLL and new patchy airspace opacities within the right upper and middle lobes (difficult to tell whether this represents progression of pneumonia 7 weeks ago or recurrent pneumonia -- of note, she reported to me improvement in symptoms after last hospitalization -> suspect this represents recurrent pneumonia in this setting). completed 7 day abx course Blood cultures Ngx4 Negative covid, flu, RSV Follow urine strep, urine legionella, sputum culture.  MRSA PCR. Small volume hemotysis noted, suspect due to pneumonia She looks much improved today, stable for discharge - appreciate pulmonology recs - repeat CT scan in 6 weeks, will send with hypertonic saline -> follow with pulmonology outpatient Sepsis physiology resolved.  Present on admission with hypoxia, leukocytosis and evidence of pneumonia on chest CT.   Metabolic Acidosis  Will monitor, bicarb started, follow outpatient   Rheumatoid Arthritis She's on prednisone chronically Currently on steroid taper, plan to taper gradually back to chronic 7 mg daily   Hypogammaglobulinemia S/p IVIG here on 4/1   T2DM SSI   History Pulmonary Embolus Xarelto  Tracheobronchomalacia Noted   Chronic Pain  Prn gabapentin and tramadol   HFrEF Appears compensated Prior EF 06/2022 30-35%, grade 1 diastolic dysfunction   Thrush Vaginal Candidiasis Fluconazole x1 for vaginal candidiasis, clotrimazole troche at discharge for thrush   06/03/23 Patient is here today for follow-up.  She denies any additional fevers.   She denies any shortness of breath.  She denies any chest pain.  She is using her flutter valve.  She is also using her nebulized saline to help break up the mucus in her airway.  She is also wearing the percussion vest at night.  Her immunologist would like [place] to recheck IgG, IgA, and IgM levels.  Hospitalist recommended that we monitor her metabolic acidosis.  Patient appears to be back to her baseline today. Past Medical History:  Diagnosis Date   Anemia    Angio-edema    Breast cancer (HCC) 1998   Left breast, in remission   Bronchiectasis (HCC)    CHF (congestive heart failure) (HCC)    Closed displaced fracture of metatarsal bone of right foot 01/20/2019   Closed fracture of fifth metatarsal bone of right foot, initial encounter 01/15/2018   Clostridium difficile colitis 01/14/2018   Coronary artery disease    Depression    "some; don't take anything for it" (02/20/2012), pt denies dx as of 06/02/21   Diverticulosis    DVT (deep venous thrombosis) (HCC)    Fibromyalgia 11/2011   Fracture of base of fifth metatarsal bone with routine healing, left    GAVE (gastric antral vascular ectasia)    GERD (gastroesophageal reflux disease)    Headache(784.0)    "related to allergies; more at different times during the year" (02/20/2012)   Hemorrhoids    Hiatal hernia    back and neck   History of blood transfusion 2023   1 unit, 5 units of Iron   Hx of adenomatous colonic polyps 04/12/2016   Hypercholesteremia    good cholesterol is high   Hypothyroidism    h/o Graves disease   IBS (irritable bowel syndrome)    Moderate persistent asthma    -FeV1 72% 2011, -IgE 102 2011, CT sinus Neg 2011   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 Casimer Lanius   Sleep apnea    uses cpap  nightly   Tracheobronchomalacia    Vasculitis (HCC) 12/13/2017   Past Surgical History:  Procedure Laterality Date   ABDOMINAL HYSTERECTOMY N/A    Phreesia 10/21/2019   ANTERIOR AND POSTERIOR REPAIR  1990's   APPENDECTOMY     ARGON LASER APPLICATION  02/05/2021   Procedure: ARGON LASER APPLICATION;  Surgeon: Benancio Deeds, MD;  Location: [facility];  Service: Gastroenterology;;   BREAST LUMPECTOMY  1998   left   BREAST SURGERY N/A    Phreesia 10/21/2019   BRONCHIAL WASHINGS  04/05/2020   Procedure: BRONCHIAL WASHINGS;  Surgeon: Martina Sinner, MD;  Location: [facility];  Service: Pulmonary;;   CAPSULOTOMY Right 08/28/2021   Procedure: CAPSULOTOMY;  Surgeon: Candelaria Stagers, DPM;  [facility]: [facility];  Service: Podiatry;  Laterality: Right;   CARPOMETACARPEL (CMC) FUSION OF THUMB WITH AUTOGRAFT FROM RADIUS  ~ 2009   "both thumbs" (02/20/2012)   CATARACT EXTRACTION W/ INTRAOCULAR LENS  IMPLANT, BILATERAL  2012   CERVICAL DISCECTOMY  10/2001   C5-C6   CERVICAL FUSION  2003   C3-C4   CHOLECYSTECTOMY     COLONOSCOPY     CORONARY STENT INTERVENTION N/A 01/11/2021   Procedure: CORONARY STENT INTERVENTION;  Surgeon: Arleen Lacer, MD;  Location: [facility]VASIVE CV LAB;  Service: Cardiovascular;  Laterality: N/A;   DEBRIDEMENT TENNIS ELBOW  ?1970's   right   ESOPHAGOGASTRODUODENOSCOPY     ESOPHAGOGASTRODUODENOSCOPY (EGD) WITH PROPOFOL N/A 02/05/2021   Procedure: ESOPHAGOGASTRODUODENOSCOPY (EGD) WITH PROPOFOL;  Surgeon: Ace Holder, MD;  Location: [facility];  Service: Gastroenterology;  Laterality: N/A;   EYE SURGERY N/A    Phreesia 10/21/2019   HAMMER TOE SURGERY Right 08/28/2021   Procedure: HAMMER TOE REPAIR 2ND TOE RIGHT FOOT;  Surgeon: Velma Ghazi, DPM;  [facility];  Service: Podiatry;  Laterality: Right;   HYSTERECTOMY     I & D EXTREMITY Right 09/22/2020   Procedure: IRRIGATION AND DEBRIDEMENT RIGHT HAND AND ELBOW;  Surgeon: Osa Blase, MD;  Location:  [facility];  Service: Orthopedics;  Laterality: Right;   ICD IMPLANT N/A 08/27/2022   Procedure: ICD IMPLANT;  Surgeon: Efraim Grange, MD;  [facility];  Service: Cardiovascular;  Laterality: N/A;   KNEE ARTHROPLASTY  ?1990's   "?right; w/cartilage repair" (02/20/2012)   MASS EXCISION Left 08/28/2021   Procedure: EXCISION OF SOFT TISSUE  MASS LEFT FOOT;  Surgeon: Velma Ghazi, DPM;  Location[facility];  Service: Podiatry;  Laterality: Left;   NASAL SEPTUM SURGERY  1980's   POSTERIOR CERVICAL FUSION/FORAMINOTOMY  2004   "failed initial fusion; rewired  anterior neck" (02/20/2012)   REVERSE SHOULDER ARTHROPLASTY Right 02/16/2020   Procedure: REVERSE SHOULDER ARTHROPLASTY;  Surgeon: Osa Blase, MD;  Location: [facility];  Service: Orthopedics;  Laterality: Right;   RIGHT/LEFT HEART CATH AND CORONARY ANGIOGRAPHY N/A 01/11/2021   Procedure: RIGHT/LEFT HEART CATH AND CORONARY ANGIOGRAPHY;  Surgeon: Arleen Lacer, MD;  Location: [facility] INVASIVE CV LAB;  Service: Cardiovascular;  Laterality: N/A;   SPINE SURGERY N/A    Phreesia 10/21/2019   TONSILLECTOMY  ~ 1953   VESICOVAGINAL FISTULA CLOSURE W/ TAH  1988   VIDEO BRONCHOSCOPY Bilateral 08/23/2016   Procedure: VIDEO BRONCHOSCOPY WITH FLUORO;  Surgeon: Samual Crochet, MD;  Location: [facility];  Service: Cardiopulmonary;  Laterality: Bilateral;   VIDEO BRONCHOSCOPY N/A 04/05/2020   Procedure: VIDEO BRONCHOSCOPY WITHOUT FLUORO;  Surgeon: Wilfredo Hanly, MD;  Location: [facility];  Service: Pulmonary;  Laterality: N/A;   Current Outpatient Medications on File Prior to Visit  Medication Sig Dispense Refill   acetaminophen (TYLENOL) 500 MG tablet Take 1,000 mg by mouth as needed for moderate pain.     albuterol (PROVENTIL) (2.5 MG/3ML) 0.083% nebulizer solution Take 3 mLs (2.5 mg total) by nebulization every 4 (four) hours as needed for wheezing or shortness of breath. 150 mL 1   albuterol (VENTOLIN HFA) 108 (90 Base) MCG/ACT inhaler  Inhale 2 puffs into the lungs every 6 (six) hours as needed for wheezing or shortness of breath. 18 g 1   allopurinol (ZYLOPRIM) 100 MG tablet Take 1 tablet (100 mg total) by mouth daily. 90 tablet 0   Alpha-D-Galactosidase (BEANO PO) Take 2-3 tablets by mouth 3 (three) times daily as needed (Gas).     Alpha-Lipoic Acid 600 MG TABS Take 600 mg by mouth in the morning.     atorvastatin (LIPITOR) 80 MG tablet Take 1 tablet (80 mg total)  by mouth every evening. 90 tablet 1   azelastine (ASTELIN) 0.1 % nasal spray Place 2 sprays into both nostrils daily as needed for rhinitis. Use in each nostril as directed 90 mL 1   Calcium-Magnesium-Vitamin D (CITRACAL CALCIUM+D) 600-40-500 MG-MG-UNIT TB24 Take 1 tablet by mouth daily.     cetirizine (ZYRTEC) 10 MG tablet Take 1 tablet (10 mg total) by mouth daily as needed for allergies (Can take a n extra dose during flare ups.). 180 tablet 1   Cholecalciferol (VITAMIN D3) 25 MCG (1000 UT) capsule Take 1,000 Units by mouth at bedtime.     clopidogrel (PLAVIX) 75 MG tablet TAKE 1 TABLET BY MOUTH EVERY DAY 90 tablet 0   clotrimazole (MYCELEX) 10 MG troche Take 1 tablet (10 mg total) by mouth 5 (five) times daily for 6 days. 30 tablet 0   denosumab (PROLIA) 60 MG/ML SOSY injection Inject 60 mg into the skin every 6 (six) months.     dexlansoprazole (DEXILANT) 60 MG capsule Take 1 capsule (60 mg total) by mouth in the morning. 90 capsule 1   diclofenac Sodium (VOLTAREN) 1 % GEL Apply 1 Application topically as needed (pain).     empagliflozin (JARDIANCE) 10 MG TABS tablet Take 1 tablet (10 mg total) by mouth daily. 90 tablet 1   ENTRESTO 24-26 MG TAKE 1 TABLET BY MOUTH TWICE A DAY 180 tablet 0   EPINEPHRINE 0.3 mg/0.3 mL IJ SOAJ injection USE AS DIRECTED BY YOUR PHYSICIAN INTRAMUSCULARLY AS NEEDED FOR ANAPHYLAXIS 2 each 1   famotidine (PEPCID) 20 MG tablet TAKE 1 TABLET (20 MG) BY MOUTH DAILY BEFORE LUNCH AND1 TABLET BY MOUTH EACH NIGHT AT BEDTIME 180 tablet 1    Fluticasone-Umeclidin-Vilant (TRELEGY ELLIPTA) 200-62.5-25 MCG/ACT AEPB INHALE 1 INHALATION INTO THE LUNGS IN THE MORNING. 120 each 1   folic acid (FOLVITE) 1 MG tablet Take 1 tablet (1 mg total) by mouth daily. 90 tablet 3   furosemide (LASIX) 40 MG tablet Take 40 mg by mouth 2 (two) times a week. Monday and Thursday     gabapentin (NEURONTIN) 300 MG capsule Take 1 capsule (300 mg total) by mouth at bedtime.     guaiFENesin (MUCINEX) 600 MG 12 hr tablet Take 600 mg by mouth 2 (two) times daily.     HIZENTRA 10 GM/50ML SOLN Inject 15 g into the skin once a week. 13 g Infusion 300 mL 11   Immune Globulin, Human, (HIZENTRA) 1 GM/5ML SOLN Inject 1 g into the skin.     Immune Globulin, Human, (HIZENTRA) 10 GM/50ML SOSY INFUSE 13 GRAMS SUBCUTANEOUSLY EVERY SEVEN DAYS (Patient not taking: No sig reported) 200 mL 11   ipratropium-albuterol (DUONEB) 0.5-2.5 (3) MG/3ML SOLN Take 3 mLs by nebulization every 6 (six) hours as needed. 360 mL 6   isosorbide-hydrALAZINE (BIDIL) 20-37.5 MG tablet TAKE 1 TABLET BY MOUTH TWICE A DAY 180 tablet 0   Lactase (LACTOSE FAST ACTING RELIEF PO) Take 2-3 tablets by mouth 3 (three) times daily before meals. For dairy food or drink     magnesium oxide (MAG-OX) 400 (240 Mg) MG tablet Take 400 mg by mouth at bedtime.     montelukast (SINGULAIR) 10 MG tablet Take 1 tablet (10 mg total) by mouth daily after supper. 90 tablet 1   mupirocin ointment (BACTROBAN) 2 % Apply 1 Application topically as needed (wounds). 30 g 3   nystatin (MYCOSTATIN) 100000 UNIT/ML suspension Swish, gargle, and swallow 5 ml after Trelegy use 473 mL 0   potassium  chloride (KLOR-CON) 10 MEQ tablet Take 10 mEq by mouth See admin instructions. Mondays and Thursdays.     predniSONE (DELTASONE) 1 MG tablet Take 2 mg by mouth daily with breakfast. Takes along with 5 mg to total 7mg      predniSONE (DELTASONE) 5 MG tablet Take 1 tablet (5 mg total) by mouth daily with breakfast. (Patient taking differently: Take 5  mg by mouth daily with breakfast. Takes along with 1 mg to total 7 mg) 90 tablet 0   Prenatal Vit-Fe Fumarate-FA (PRENATAL VITAMINS PO) Take 1 tablet by mouth in the morning.     Probiotic Product (ALIGN) 4 MG CAPS Take 4 mg by mouth every evening.     Respiratory Therapy Supplies (NEBULIZER MASK ADULT) MISC 1 Device by Does not apply route as directed. 1 each 1   rivaroxaban (XARELTO) 20 MG TABS tablet Take 1 tablet (20 mg total) by mouth daily with supper. 90 tablet 2   sodium bicarbonate 650 MG tablet Take 1 tablet (650 mg total) by mouth 3 (three) times daily. Follow your kidney function and labs with your PCP outpatient to determine if this is something you need to continue long term or not. 90 tablet 0   sodium chloride HYPERTONIC 3 % nebulizer solution Take by nebulization in the morning and at bedtime. To help manage secretions 240 mL 0   traMADol (ULTRAM) 50 MG tablet Take 1 tablet (50 mg total) by mouth at bedtime. 30 tablet 0   traZODone (DESYREL) 50 MG tablet Take 2 tablets (100 mg total) by mouth at bedtime. (Patient taking differently: Take 50 mg by mouth at bedtime.) 60 tablet 1   Current Facility-Administered Medications on File Prior to Visit  Medication Dose Route Frequency Provider Last Rate Last Admin   iohexol (OMNIPAQUE) 350 MG/ML injection    PRN Marykay Lex, MD   105 mL at 01/11/21 0955   ipratropium-albuterol (DUONEB) 0.5-2.5 (3) MG/3ML nebulizer solution 3 mL  3 mL Nebulization Q6H PRN Martina Sinner, MD       Allergies  Allergen Reactions   Baclofen Anaphylaxis and Other (See Comments)    Altered mental status requiring a 3-day hospital stay- patient became unresponsive   "Put me in a coma for five days", Altered mental status requiring a 3-day hospital stay- patient became unresponsive   Other Shortness Of Breath and Other (See Comments)    Grass and weeds "sneezing; filled sinuses" (02/20/2012)   Pork-Derived Products Anaphylaxis   Shellfish Allergy  Anaphylaxis, Shortness Of Breath, Itching and Rash   Shrimp Extract Anaphylaxis and Other (See Comments)    ALL SHELLFISH   Tetracycline Hcl Nausea And Vomiting   Xolair [Omalizumab] Other (See Comments)    Caused Blood clot   Zoledronic Acid Other (See Comments)    Reclast- Fever, Put in hospital, dr said it was a reaction from a reaction    Fever, inability to walk, Put in hospital  Fever, Put in hospital, dr said it was a reaction from a reaction , Other reaction(s): Unknown   Celecoxib Swelling    Feet swelling   Dilaudid [Hydromorphone Hcl] Itching   Hydrocodone-Acetaminophen Itching   Levofloxacin Other (See Comments)    GI upset   Molds & Smuts Cough   Morphine And Codeine Hives and Itching   Oxycodone Hcl Nausea And Vomiting    GI Intolerance   Paroxetine Nausea And Vomiting and Other (See Comments)    GI Intolerance also   Penicillins Rash and  Other (See Comments)    "welts" (02/20/2012)  Tolerated Ancef 02/16/20   Tetracycline Hcl Other (See Comments)    GI Intolerance   Oxycodone-Acetaminophen Itching   Diltiazem Hcl Swelling   Dust Mite Extract Other (See Comments) and Cough    "sneezing" (02/20/2012) too   Gamunex [Immune Globulin] Itching and Rash    Tolerates hizentra    Penicillin G Procaine Rash and Other (See Comments)    Welts, also   Rituximab Other (See Comments)    Anemia    Rofecoxib Swelling    Vioxx- feet swelling   Tree Extract Itching    "tested and told I was allergic to it; never experienced a reaction to it" (02/20/2012)   Social History   Socioeconomic History   Marital status: Divorced    Spouse name: Not on file   Number of children: 2   Years of education: college   Highest education level: Bachelor's degree (e.g., BA, AB, BS)  Occupational History   Occupation: Disabled    Comment: Retired Dispensing optician: RETIRED  Tobacco Use   Smoking status: Never    Passive exposure: Past   Smokeless tobacco:  Never   Tobacco comments:    Parents  Vaping Use   Vaping status: Never Used  Substance and Sexual Activity   Alcohol use: Not Currently    Alcohol/week: 0.0 standard drinks of alcohol   Drug use: No   Sexual activity: Not Currently    Birth control/protection: Surgical    Comment: Hysterectomy  Other Topics Concern   Not on file  Social History Narrative   Patient lives at home alone. Patient  divorced.    Patient has her BS degree.   Right handed.   Caffeine- sometimes coffee.      [facility]:   Born in [place], [place]. She worked as a Armed forces operational officer. She has no pets currently. She does have indoor plants. Previously had mold in her home that was remediated. Carpet was removed.          Social Drivers of Corporate investment banker Strain: Low Risk  (03/26/2023)   Overall Financial Resource Strain (CARDIA)    Difficulty of Paying Living Expenses: Not very hard  Food Insecurity: No Food Insecurity (05/29/2023)   Hunger Vital Sign    Worried About Running Out of Food in the Last Year: Never true    Ran Out of Food in the Last Year: Never true  Transportation Needs: No Transportation Needs (05/29/2023)   PRAPARE - Administrator, Civil Service (Medical): No    Lack of Transportation (Non-Medical): No  Physical Activity: Inactive (03/26/2023)   Exercise Vital Sign    Days of Exercise per Week: 0 days    Minutes of Exercise per Session: 30 min  Stress: Stress Concern Present (03/26/2023)   [employer] of Occupational Health - Occupational Stress Questionnaire    Feeling of Stress : To some extent  Social Connections: Socially Isolated (05/18/2023)   Social Connection and Isolation Panel [NHANES]    Frequency of Communication with Friends and Family: Never    Frequency of Social Gatherings with Friends and Family: Never    Attends Religious Services: Never    Database administrator or Organizations: No    Attends Banker Meetings: Never     Marital Status: Divorced  Catering manager Violence: Not At Risk (05/29/2023)   Humiliation, Afraid, Rape, and Kick questionnaire  Fear of Current or Ex-Partner: No    Emotionally Abused: No    Physically Abused: No    Sexually Abused: No     Review of Systems     Objective:   Physical Exam Vitals reviewed.  Constitutional:      General: She is not in acute distress.    Appearance: Normal appearance. She is not ill-appearing or toxic-appearing.  HENT:     Nose: No congestion or rhinorrhea.  Cardiovascular:     Rate and Rhythm: Normal rate and regular rhythm.     Heart sounds: Normal heart sounds. No murmur heard. Pulmonary:     Effort: Pulmonary effort is normal. No respiratory distress.     Breath sounds: No stridor or decreased air movement. No wheezing, rhonchi or rales.  Abdominal:     General: Bowel sounds are normal. There is no distension.     Palpations: Abdomen is soft.     Tenderness: There is no abdominal tenderness. There is no guarding.  Musculoskeletal:     Right lower leg: No edema.     Left lower leg: No edema.  Skin:    Findings: No erythema.  Neurological:     Mental Status: She is alert and oriented to person, place, and time.     Cranial Nerves: No cranial nerve deficit.     Sensory: No sensory deficit.     Motor: No weakness.     Coordination: Coordination normal.        Assessment & Plan:  Hypogammaglobulinemia (HCC) - Plan: IgG, IgA, IgM, Basic Metabolic Panel Without GFR, CBC with Differential/Platelet  Pneumonia of both lungs due to infectious organism, unspecified part of lung  Immunosuppression (HCC) Patient has had several instances of recurrent pneumonia.  She has a CT scan scheduled in 6 weeks with pulmonology.  She states that they plan to do a bronchoscopy at that point given the recurrent opacity seen on chest CT.  Patient is chronically immunosuppressed.  She is on subcu IVIG.  However she continues to be on chronic  immunosuppression with prednisone for rheumatoid arthritis.  I recommended that she discuss with her rheumatologist if there is any way possible to wean away from prednisone given her recurrent infections.  Continue to encourage patient to use the percussion vest, flutter valve, and nebulized saline to help with clearance of mucus and secretions.  Recommended wearing a mask in public.

## 2023-06-04 ENCOUNTER — Encounter: Payer: Self-pay | Admitting: Cardiovascular Disease

## 2023-06-04 LAB — CBC WITH DIFFERENTIAL/PLATELET
Absolute Lymphocytes: 780 {cells}/uL — ABNORMAL LOW (ref 850–3900)
Absolute Monocytes: 822 {cells}/uL (ref 200–950)
Basophils Absolute: 17 {cells}/uL (ref 0–200)
Basophils Relative: 0.2 %
Eosinophils Absolute: 100 {cells}/uL (ref 15–500)
Eosinophils Relative: 1.2 %
HCT: 28.5 % — ABNORMAL LOW (ref 35.0–45.0)
Hemoglobin: 9.4 g/dL — ABNORMAL LOW (ref 11.7–15.5)
MCH: 33.8 pg — ABNORMAL HIGH (ref 27.0–33.0)
MCHC: 33 g/dL (ref 32.0–36.0)
MCV: 102.5 fL — ABNORMAL HIGH (ref 80.0–100.0)
MPV: 11.6 fL (ref 7.5–12.5)
Monocytes Relative: 9.9 %
Neutro Abs: 6582 {cells}/uL (ref 1500–7800)
Neutrophils Relative %: 79.3 %
Platelets: 187 10*3/uL (ref 140–400)
RBC: 2.78 10*6/uL — ABNORMAL LOW (ref 3.80–5.10)
RDW: 15.9 % — ABNORMAL HIGH (ref 11.0–15.0)
Total Lymphocyte: 9.4 %
WBC: 8.3 10*3/uL (ref 3.8–10.8)

## 2023-06-04 LAB — IGG, IGA, IGM
IgG (Immunoglobin G), Serum: 1042 mg/dL (ref 600–1540)
IgM, Serum: 61 mg/dL (ref 50–300)
Immunoglobulin A: 175 mg/dL (ref 70–320)

## 2023-06-04 LAB — BASIC METABOLIC PANEL WITHOUT GFR
BUN/Creatinine Ratio: 20 (calc) (ref 6–22)
BUN: 24 mg/dL (ref 7–25)
CO2: 25 mmol/L (ref 20–32)
Calcium: 8.8 mg/dL (ref 8.6–10.4)
Chloride: 105 mmol/L (ref 98–110)
Creat: 1.22 mg/dL — ABNORMAL HIGH (ref 0.60–1.00)
Glucose, Bld: 157 mg/dL — ABNORMAL HIGH (ref 65–99)
Potassium: 4.8 mmol/L (ref 3.5–5.3)
Sodium: 138 mmol/L (ref 135–146)

## 2023-06-04 NOTE — Telephone Encounter (Signed)
 Called and gave verbal to resume infusions

## 2023-06-05 ENCOUNTER — Telehealth: Payer: Self-pay

## 2023-06-05 NOTE — Telephone Encounter (Signed)
 TRIED TO SEND THE PROLIA VERIFICATION THROUGH [employer], UNABLE TO SEND. WILL RE-TRY TOMORROW, 06/06/2023. MJP,LPN

## 2023-06-06 ENCOUNTER — Telehealth: Payer: Self-pay

## 2023-06-06 ENCOUNTER — Other Ambulatory Visit: Payer: Self-pay | Admitting: Family Medicine

## 2023-06-06 DIAGNOSIS — I5022 Chronic systolic (congestive) heart failure: Secondary | ICD-10-CM | POA: Diagnosis not present

## 2023-06-06 DIAGNOSIS — M19011 Primary osteoarthritis, right shoulder: Secondary | ICD-10-CM

## 2023-06-06 DIAGNOSIS — M1120 Other chondrocalcinosis, unspecified site: Secondary | ICD-10-CM

## 2023-06-06 DIAGNOSIS — J4550 Severe persistent asthma, uncomplicated: Secondary | ICD-10-CM | POA: Diagnosis not present

## 2023-06-06 DIAGNOSIS — E1122 Type 2 diabetes mellitus with diabetic chronic kidney disease: Secondary | ICD-10-CM | POA: Diagnosis not present

## 2023-06-06 DIAGNOSIS — J188 Other pneumonia, unspecified organism: Secondary | ICD-10-CM | POA: Diagnosis not present

## 2023-06-06 DIAGNOSIS — J9621 Acute and chronic respiratory failure with hypoxia: Secondary | ICD-10-CM | POA: Diagnosis not present

## 2023-06-06 DIAGNOSIS — I130 Hypertensive heart and chronic kidney disease with heart failure and stage 1 through stage 4 chronic kidney disease, or unspecified chronic kidney disease: Secondary | ICD-10-CM | POA: Diagnosis not present

## 2023-06-06 DIAGNOSIS — N1832 Chronic kidney disease, stage 3b: Secondary | ICD-10-CM | POA: Diagnosis not present

## 2023-06-06 DIAGNOSIS — R6520 Severe sepsis without septic shock: Secondary | ICD-10-CM | POA: Diagnosis not present

## 2023-06-06 DIAGNOSIS — A419 Sepsis, unspecified organism: Secondary | ICD-10-CM | POA: Diagnosis not present

## 2023-06-06 MED ORDER — TRAMADOL HCL 50 MG PO TABS: 50.0000 mg | ORAL_TABLET | Freq: Every day | ORAL | 0 refills | Status: DC

## 2023-06-06 NOTE — Telephone Encounter (Signed)
 Prescription Request  06/06/2023  LOV: 06/03/23  What is the name of the medication or equipment? traMADol (ULTRAM) 50 MG tablet [[id number]]   Have you contacted your pharmacy to request a refill? Yes   Which pharmacy would you like this sent to?  [id number] [place], [place] - [address] [place] [address] Phone: [phone] Fax: [phone]    Patient notified that their request is being sent to the clinical staff for review and that they should receive a response within 2 business days.   Please advise at [facility] [phone]

## 2023-06-06 NOTE — Telephone Encounter (Signed)
 LM on identified voice mail advising patient that [employer] is requiring a walk test to measure her oxygen levels. Asked pt to call office back to schedule. Mjp,lpn

## 2023-06-06 NOTE — Telephone Encounter (Signed)
 Spoke with Rodman Clam with [employer] support and was able to complete the authorization for pt's Prolia. Mjp,lpn Case # [id number]

## 2023-06-06 NOTE — Telephone Encounter (Signed)
 Copied from CRM [id number]. Topic: Clinical - Medication Refill >> Jun 06, 2023 10:14 AM Opal Bill wrote: Most Recent Primary Care Visit:  Provider: Eliane Grooms T  Department: [facility]  Visit Type: HOSPITAL FOLLOW UP  Date: 05/02/2023  Medication:  traMADol (ULTRAM) 50 MG tablet traZODone (DESYREL) 50 MG tablet  Has the patient contacted their pharmacy? No, switching pharmacies  Is this the correct pharmacy for this prescription? Yes If no, delete pharmacy and type the correct one.  This is the patient's preferred pharmacy:  [facility] [address] [place] - [address] [place] [address] Phone: [phone] Fax: [phone]  Has the prescription been filled recently? Yes  Is the patient out of the medication? Yes  Has the patient been seen for an appointment in the last year OR does the patient have an upcoming appointment? Yes  Can we respond through MyChart? No  Agent: Please be advised that Rx refills may take up to 3 business days. We ask that you follow-up with your pharmacy.

## 2023-06-07 ENCOUNTER — Telehealth: Payer: Self-pay

## 2023-06-07 DIAGNOSIS — I130 Hypertensive heart and chronic kidney disease with heart failure and stage 1 through stage 4 chronic kidney disease, or unspecified chronic kidney disease: Secondary | ICD-10-CM | POA: Diagnosis not present

## 2023-06-07 DIAGNOSIS — J4550 Severe persistent asthma, uncomplicated: Secondary | ICD-10-CM | POA: Diagnosis not present

## 2023-06-07 DIAGNOSIS — J9621 Acute and chronic respiratory failure with hypoxia: Secondary | ICD-10-CM | POA: Diagnosis not present

## 2023-06-07 DIAGNOSIS — N1832 Chronic kidney disease, stage 3b: Secondary | ICD-10-CM | POA: Diagnosis not present

## 2023-06-07 DIAGNOSIS — R6520 Severe sepsis without septic shock: Secondary | ICD-10-CM | POA: Diagnosis not present

## 2023-06-07 DIAGNOSIS — E1122 Type 2 diabetes mellitus with diabetic chronic kidney disease: Secondary | ICD-10-CM | POA: Diagnosis not present

## 2023-06-07 DIAGNOSIS — A419 Sepsis, unspecified organism: Secondary | ICD-10-CM | POA: Diagnosis not present

## 2023-06-07 DIAGNOSIS — J188 Other pneumonia, unspecified organism: Secondary | ICD-10-CM | POA: Diagnosis not present

## 2023-06-07 DIAGNOSIS — I5022 Chronic systolic (congestive) heart failure: Secondary | ICD-10-CM | POA: Diagnosis not present

## 2023-06-07 MED ORDER — TRAZODONE HCL 50 MG PO TABS
100.0000 mg | ORAL_TABLET | Freq: Every day | ORAL | 0 refills | Status: DC
Start: 2023-06-07 — End: 2023-07-04

## 2023-06-07 NOTE — Telephone Encounter (Signed)
 My Chart message sent and LM on identified voice mail advising pt of all yesterday, 06/06/23 and to call and schedule an appointment. Mjp,lpn  Copied from CRM [id number]. Topic: Clinical - Prescription Issue >> Jun 07, 2023  9:51 AM Georgeann Kindred wrote: Reason for CRM: Amy from [employer] called and stated that patient's insurance is requiring the prescription to state, "Please evaluate patient for POC 1-6 Pulse Dose if qualified please dispense." She is asking if the provider could send a new prescription over stating the above: PLEASE EVALUATE PATIENT FOR POC 1-6 DOSE. IF QUALIFIED PLEASE DISPENSE. Fax new prescription to FAX: [phone].

## 2023-06-07 NOTE — Telephone Encounter (Signed)
 Requested Prescriptions  Pending Prescriptions Disp Refills   traMADol  (ULTRAM ) 50 MG tablet 30 tablet 0    Sig: Take 1 tablet (50 mg total) by mouth at bedtime.     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[facility])   [place] [facility] [name], Cisco Crest, MD   1 month ago Anemia, unspecified type   [place] [facility] Austine Lefort, MD   2 months ago Fever, unspecified fever cause   [place] [facility] Amadeo June, MD   3 months ago Fever, unspecified fever cause   [place] [facility] Austine Lefort, MD   4 months ago HFrEF (heart failure with reduced ejection fraction) [facility])   [place] [facility] [name], Cisco Crest, MD       Future Appointments             In 1 month Kozlow, Rema Care, MD [place] [facility] [place] at [place]   In 2 months Rice, Haig Levan, MD [facility] Health Rheumatology - A Dept Of [place]. [facility]             traZODone  (DESYREL ) 50 MG tablet 180 tablet 0    Sig: Take 2 tablets (100 mg total) by mouth at bedtime.     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[facility])   [place] [facility] [name], Cisco Crest, MD   1 month ago Anemia, unspecified type   [place] [facility] Medicine Austine Lefort, MD   2 months ago Fever, unspecified fever cause   [place] Medicine Amadeo June, MD    3 months ago Fever, unspecified fever cause   [place] [facility] Austine Lefort, MD   4 months ago HFrEF (heart failure with reduced ejection fraction) [facility])   [place] [facility] Family Medicine [name], Cisco Crest, MD       Future Appointments             In 1 month Kozlow, Rema Care, MD  [facility]  at [place]   In 2 months Rice, Haig Levan, MD [facility] Health Rheumatology - A Dept Of . [facility]

## 2023-06-12 ENCOUNTER — Ambulatory Visit (INDEPENDENT_AMBULATORY_CARE_PROVIDER_SITE_OTHER)

## 2023-06-12 ENCOUNTER — Telehealth: Payer: Self-pay

## 2023-06-12 VITALS — HR 99

## 2023-06-12 DIAGNOSIS — J189 Pneumonia, unspecified organism: Secondary | ICD-10-CM

## 2023-06-12 DIAGNOSIS — R918 Other nonspecific abnormal finding of lung field: Secondary | ICD-10-CM

## 2023-06-12 DIAGNOSIS — I5022 Chronic systolic (congestive) heart failure: Secondary | ICD-10-CM

## 2023-06-12 NOTE — Telephone Encounter (Signed)
 Pt here for nurse visit. Pt asks if she should continue the Sodium Bicarb? Thanks.

## 2023-06-12 NOTE — Progress Notes (Signed)
 Patient is in office today for a nurse visit for  oxygen  walking test . Patient here to perform walking test to qualify for oxygen  therapy for insurance. Pt's results are as follows: Sitting without oxygen : 94% Walking without oxygen : 93% Sitting after walking without oxygen : 94% Note forwarded to Dr. Cheril Cork to review and co-sign. Mjp,lpn

## 2023-06-13 DIAGNOSIS — J4550 Severe persistent asthma, uncomplicated: Secondary | ICD-10-CM | POA: Diagnosis not present

## 2023-06-13 DIAGNOSIS — I130 Hypertensive heart and chronic kidney disease with heart failure and stage 1 through stage 4 chronic kidney disease, or unspecified chronic kidney disease: Secondary | ICD-10-CM | POA: Diagnosis not present

## 2023-06-13 DIAGNOSIS — I5022 Chronic systolic (congestive) heart failure: Secondary | ICD-10-CM | POA: Diagnosis not present

## 2023-06-13 DIAGNOSIS — A419 Sepsis, unspecified organism: Secondary | ICD-10-CM | POA: Diagnosis not present

## 2023-06-13 DIAGNOSIS — R6520 Severe sepsis without septic shock: Secondary | ICD-10-CM | POA: Diagnosis not present

## 2023-06-13 DIAGNOSIS — E1122 Type 2 diabetes mellitus with diabetic chronic kidney disease: Secondary | ICD-10-CM | POA: Diagnosis not present

## 2023-06-13 DIAGNOSIS — J9621 Acute and chronic respiratory failure with hypoxia: Secondary | ICD-10-CM | POA: Diagnosis not present

## 2023-06-13 DIAGNOSIS — J188 Other pneumonia, unspecified organism: Secondary | ICD-10-CM | POA: Diagnosis not present

## 2023-06-13 DIAGNOSIS — N1832 Chronic kidney disease, stage 3b: Secondary | ICD-10-CM | POA: Diagnosis not present

## 2023-06-15 DIAGNOSIS — J45909 Unspecified asthma, uncomplicated: Secondary | ICD-10-CM | POA: Diagnosis not present

## 2023-06-15 DIAGNOSIS — R0602 Shortness of breath: Secondary | ICD-10-CM | POA: Diagnosis not present

## 2023-06-15 DIAGNOSIS — G4736 Sleep related hypoventilation in conditions classified elsewhere: Secondary | ICD-10-CM | POA: Diagnosis not present

## 2023-06-15 DIAGNOSIS — G4733 Obstructive sleep apnea (adult) (pediatric): Secondary | ICD-10-CM | POA: Diagnosis not present

## 2023-06-15 DIAGNOSIS — M0600 Rheumatoid arthritis without rheumatoid factor, unspecified site: Secondary | ICD-10-CM | POA: Diagnosis not present

## 2023-06-15 DIAGNOSIS — I5032 Chronic diastolic (congestive) heart failure: Secondary | ICD-10-CM | POA: Diagnosis not present

## 2023-06-15 DIAGNOSIS — J471 Bronchiectasis with (acute) exacerbation: Secondary | ICD-10-CM | POA: Diagnosis not present

## 2023-06-15 DIAGNOSIS — G894 Chronic pain syndrome: Secondary | ICD-10-CM | POA: Diagnosis not present

## 2023-06-15 DIAGNOSIS — J4540 Moderate persistent asthma, uncomplicated: Secondary | ICD-10-CM | POA: Diagnosis not present

## 2023-06-17 DIAGNOSIS — J188 Other pneumonia, unspecified organism: Secondary | ICD-10-CM | POA: Diagnosis not present

## 2023-06-17 DIAGNOSIS — A419 Sepsis, unspecified organism: Secondary | ICD-10-CM | POA: Diagnosis not present

## 2023-06-17 DIAGNOSIS — E1122 Type 2 diabetes mellitus with diabetic chronic kidney disease: Secondary | ICD-10-CM | POA: Diagnosis not present

## 2023-06-17 DIAGNOSIS — N1832 Chronic kidney disease, stage 3b: Secondary | ICD-10-CM | POA: Diagnosis not present

## 2023-06-17 DIAGNOSIS — J4550 Severe persistent asthma, uncomplicated: Secondary | ICD-10-CM | POA: Diagnosis not present

## 2023-06-17 DIAGNOSIS — I130 Hypertensive heart and chronic kidney disease with heart failure and stage 1 through stage 4 chronic kidney disease, or unspecified chronic kidney disease: Secondary | ICD-10-CM | POA: Diagnosis not present

## 2023-06-17 DIAGNOSIS — R6520 Severe sepsis without septic shock: Secondary | ICD-10-CM | POA: Diagnosis not present

## 2023-06-17 DIAGNOSIS — I5022 Chronic systolic (congestive) heart failure: Secondary | ICD-10-CM | POA: Diagnosis not present

## 2023-06-17 DIAGNOSIS — J9621 Acute and chronic respiratory failure with hypoxia: Secondary | ICD-10-CM | POA: Diagnosis not present

## 2023-06-18 ENCOUNTER — Ambulatory Visit: Payer: Medicare PPO | Admitting: Family

## 2023-06-18 ENCOUNTER — Ambulatory Visit: Payer: Medicare PPO | Admitting: Allergy and Immunology

## 2023-06-20 DIAGNOSIS — E1122 Type 2 diabetes mellitus with diabetic chronic kidney disease: Secondary | ICD-10-CM | POA: Diagnosis not present

## 2023-06-20 DIAGNOSIS — J9621 Acute and chronic respiratory failure with hypoxia: Secondary | ICD-10-CM | POA: Diagnosis not present

## 2023-06-20 DIAGNOSIS — A419 Sepsis, unspecified organism: Secondary | ICD-10-CM | POA: Diagnosis not present

## 2023-06-20 DIAGNOSIS — J4550 Severe persistent asthma, uncomplicated: Secondary | ICD-10-CM | POA: Diagnosis not present

## 2023-06-20 DIAGNOSIS — I5022 Chronic systolic (congestive) heart failure: Secondary | ICD-10-CM | POA: Diagnosis not present

## 2023-06-20 DIAGNOSIS — N1832 Chronic kidney disease, stage 3b: Secondary | ICD-10-CM | POA: Diagnosis not present

## 2023-06-20 DIAGNOSIS — J188 Other pneumonia, unspecified organism: Secondary | ICD-10-CM | POA: Diagnosis not present

## 2023-06-20 DIAGNOSIS — I130 Hypertensive heart and chronic kidney disease with heart failure and stage 1 through stage 4 chronic kidney disease, or unspecified chronic kidney disease: Secondary | ICD-10-CM | POA: Diagnosis not present

## 2023-06-20 DIAGNOSIS — R6520 Severe sepsis without septic shock: Secondary | ICD-10-CM | POA: Diagnosis not present

## 2023-06-25 ENCOUNTER — Telehealth (HOSPITAL_BASED_OUTPATIENT_CLINIC_OR_DEPARTMENT_OTHER): Payer: Self-pay

## 2023-06-25 DIAGNOSIS — E1122 Type 2 diabetes mellitus with diabetic chronic kidney disease: Secondary | ICD-10-CM | POA: Diagnosis not present

## 2023-06-25 DIAGNOSIS — R6520 Severe sepsis without septic shock: Secondary | ICD-10-CM | POA: Diagnosis not present

## 2023-06-25 DIAGNOSIS — A419 Sepsis, unspecified organism: Secondary | ICD-10-CM | POA: Diagnosis not present

## 2023-06-25 DIAGNOSIS — J4550 Severe persistent asthma, uncomplicated: Secondary | ICD-10-CM | POA: Diagnosis not present

## 2023-06-25 DIAGNOSIS — N1832 Chronic kidney disease, stage 3b: Secondary | ICD-10-CM | POA: Diagnosis not present

## 2023-06-25 DIAGNOSIS — I5022 Chronic systolic (congestive) heart failure: Secondary | ICD-10-CM | POA: Diagnosis not present

## 2023-06-25 DIAGNOSIS — I130 Hypertensive heart and chronic kidney disease with heart failure and stage 1 through stage 4 chronic kidney disease, or unspecified chronic kidney disease: Secondary | ICD-10-CM | POA: Diagnosis not present

## 2023-06-25 DIAGNOSIS — J188 Other pneumonia, unspecified organism: Secondary | ICD-10-CM | POA: Diagnosis not present

## 2023-06-25 DIAGNOSIS — J4551 Severe persistent asthma with (acute) exacerbation: Secondary | ICD-10-CM | POA: Diagnosis not present

## 2023-06-25 DIAGNOSIS — J9621 Acute and chronic respiratory failure with hypoxia: Secondary | ICD-10-CM | POA: Diagnosis not present

## 2023-06-25 NOTE — Telephone Encounter (Signed)
   FYI   Copied from CRM [id number]. Topic: General - Other >> Jun 25, 2023  3:02 PM Chantha C wrote: Reason for CRM: Patient states purchase an [employer] portable oxygen  machine and was advised to let Dr. Diania Fortes know. Patient did not use insurance, paid on her own. FYI

## 2023-06-27 ENCOUNTER — Other Ambulatory Visit

## 2023-06-28 ENCOUNTER — Other Ambulatory Visit (HOSPITAL_COMMUNITY): Payer: Self-pay

## 2023-06-28 ENCOUNTER — Other Ambulatory Visit: Payer: Self-pay

## 2023-07-01 ENCOUNTER — Other Ambulatory Visit

## 2023-07-02 ENCOUNTER — Other Ambulatory Visit

## 2023-07-03 ENCOUNTER — Other Ambulatory Visit: Payer: Self-pay | Admitting: Family Medicine

## 2023-07-03 DIAGNOSIS — M19011 Primary osteoarthritis, right shoulder: Secondary | ICD-10-CM

## 2023-07-03 DIAGNOSIS — M1120 Other chondrocalcinosis, unspecified site: Secondary | ICD-10-CM

## 2023-07-04 ENCOUNTER — Other Ambulatory Visit: Payer: Self-pay | Admitting: Family Medicine

## 2023-07-04 DIAGNOSIS — I5020 Unspecified systolic (congestive) heart failure: Secondary | ICD-10-CM

## 2023-07-04 DIAGNOSIS — I5022 Chronic systolic (congestive) heart failure: Secondary | ICD-10-CM | POA: Diagnosis not present

## 2023-07-04 DIAGNOSIS — I130 Hypertensive heart and chronic kidney disease with heart failure and stage 1 through stage 4 chronic kidney disease, or unspecified chronic kidney disease: Secondary | ICD-10-CM | POA: Diagnosis not present

## 2023-07-04 DIAGNOSIS — J4550 Severe persistent asthma, uncomplicated: Secondary | ICD-10-CM | POA: Diagnosis not present

## 2023-07-04 DIAGNOSIS — J9621 Acute and chronic respiratory failure with hypoxia: Secondary | ICD-10-CM | POA: Diagnosis not present

## 2023-07-04 DIAGNOSIS — E1122 Type 2 diabetes mellitus with diabetic chronic kidney disease: Secondary | ICD-10-CM | POA: Diagnosis not present

## 2023-07-04 DIAGNOSIS — R6520 Severe sepsis without septic shock: Secondary | ICD-10-CM | POA: Diagnosis not present

## 2023-07-04 DIAGNOSIS — A419 Sepsis, unspecified organism: Secondary | ICD-10-CM | POA: Diagnosis not present

## 2023-07-04 DIAGNOSIS — N1832 Chronic kidney disease, stage 3b: Secondary | ICD-10-CM | POA: Diagnosis not present

## 2023-07-04 DIAGNOSIS — J188 Other pneumonia, unspecified organism: Secondary | ICD-10-CM | POA: Diagnosis not present

## 2023-07-04 NOTE — Telephone Encounter (Signed)
 Prescription Request  07/04/2023  LOV: 06/03/2023  What is the name of the medication or equipment? empagliflozin  (JARDIANCE ) 10 MG TABS tablet   Have you contacted your pharmacy to request a refill? Yes   Which pharmacy would you like this sent to?  [id number], [place] - [address] [place] [address] Phone: [phone] Fax: [phone]    Patient notified that their request is being sent to the clinical staff for review and that they should receive a response within 2 business days.   Please advise at [facility] [phone]

## 2023-07-04 NOTE — Telephone Encounter (Signed)
 Requested Prescriptions  Pending Prescriptions Disp Refills   traZODone  (DESYREL ) 50 MG tablet [Pharmacy Med Name: TRAZODONE  50 MG TABLET] 180 tablet 0    Sig: TAKE 2 TABLETS BY MOUTH AT BEDTIME.     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[facility])   [place] [facility] Austine Lefort, MD   2 months ago Anemia, unspecified type   [place] [facility] Austine Lefort, MD   3 months ago Fever, unspecified fever cause   [place] [facility] Amadeo June, MD   4 months ago Fever, unspecified fever cause   [place] [facility] Austine Lefort, MD   5 months ago HFrEF (heart failure with reduced ejection fraction) [facility])   [place] [facility] [name], Cisco Crest, MD       Future Appointments             In 1 month Rice, Haig Levan, MD [facility] Health Rheumatology - A Dept Of [place]. 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[facility])   [place] [facility] Austine Lefort, MD   2 months ago Anemia, unspecified type   [place] [facility] Medicine Austine Lefort, MD   3 months ago Fever, unspecified fever cause   [place] [facility] Family  Medicine Amadeo June, MD   4 months ago Fever, unspecified fever cause   [place] [facility] Medicine Austine Lefort, MD   5 months ago HFrEF (heart failure with reduced ejection fraction) [facility])   [place] [facility] Family Medicine [name], Cisco Crest, MD       Future Appointments             In 1 month Rice, Haig Levan, MD [facility] Health Rheumatology - A Dept Of [place]. [facility]   In 1 month Kozlow, Rema Care, MD [place] [facility] [place] at [facility]

## 2023-07-04 NOTE — Telephone Encounter (Signed)
 Requested medication (s) are due for refill today: Yes  Requested medication (s) are on the active medication list: Yes  Last refill:  06/06/23  Future visit scheduled:   Notes to clinic:  Not delegated.    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[facility])   [place] [facility] Family Medicine Austine Lefort, MD   2 months ago Anemia, unspecified type   [place] [facility] Austine Lefort, MD   3 months ago Fever, unspecified fever cause   [place] [facility] Family Medicine Amadeo June, MD   4 months ago Fever, unspecified fever cause   [place] [facility] Medicine Austine Lefort, MD   5 months ago HFrEF (heart failure with reduced ejection fraction) [facility])   [place] [facility] [name], Cisco Crest, MD       Future Appointments             In 1 month Rice, Haig Levan, MD [facility] Health Rheumatology - A Dept Of [place]. [facility]   In 1 month Kozlow, Rema Care, MD [place] [facility] [place] at [facility] Prescriptions Disp Refills   traZODone  (DESYREL ) 50 MG tablet 180 tablet 0    Sig: TAKE 2 TABLETS BY MOUTH AT BEDTIME.     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 Family Medicine  Austine Lefort, MD   2 months ago Anemia, unspecified type   [place] [facility] Austine Lefort, MD   3 months ago Fever, unspecified fever cause   [place] [facility] Family Medicine Amadeo June, MD   4 months ago Fever, unspecified fever cause   [place] [facility] Austine Lefort, MD   5 months ago HFrEF (heart failure with reduced ejection fraction) [facility])   [place] [facility] Family Medicine [name], Cisco Crest, MD       Future Appointments             In 1 month Rice, Haig Levan, MD [facility] Health Rheumatology - A Dept Of [place]. [facility]   In 1 month Kozlow, Rema Care, MD [place] [facility] [place] at [facility]

## 2023-07-05 ENCOUNTER — Telehealth: Payer: Self-pay

## 2023-07-05 ENCOUNTER — Ambulatory Visit: Admitting: Pulmonary Disease

## 2023-07-05 ENCOUNTER — Other Ambulatory Visit

## 2023-07-05 ENCOUNTER — Other Ambulatory Visit: Payer: Self-pay

## 2023-07-05 DIAGNOSIS — M19011 Primary osteoarthritis, right shoulder: Secondary | ICD-10-CM

## 2023-07-05 DIAGNOSIS — I5020 Unspecified systolic (congestive) heart failure: Secondary | ICD-10-CM

## 2023-07-05 DIAGNOSIS — M0600 Rheumatoid arthritis without rheumatoid factor, unspecified site: Secondary | ICD-10-CM

## 2023-07-05 DIAGNOSIS — E0500 Thyrotoxicosis with diffuse goiter without thyrotoxic crisis or storm: Secondary | ICD-10-CM

## 2023-07-05 DIAGNOSIS — M810 Age-related osteoporosis without current pathological fracture: Secondary | ICD-10-CM

## 2023-07-05 DIAGNOSIS — D849 Immunodeficiency, unspecified: Secondary | ICD-10-CM

## 2023-07-05 MED ORDER — EMPAGLIFLOZIN 10 MG PO TABS
10.0000 mg | ORAL_TABLET | Freq: Every day | ORAL | 1 refills | Status: AC
Start: 2023-07-05 — End: ?

## 2023-07-05 NOTE — Telephone Encounter (Signed)
 Copied from CRM [id number]. Topic: Clinical - Medication Question >> Jul 05, 2023  2:21 PM Lotus Round B wrote: Reason for CRM: pt called in to see if Dr.[name] can put in a prescription for her empagliflozin  (JARDIANCE ) 10 MG TABS tablet medication to the pharmacy   [id number], [place] - [address] [place] [address] Phone: [phone] Fax: [phone] Hours: Not open 24 hours  Any questions to give the patient a call .

## 2023-07-05 NOTE — Telephone Encounter (Signed)
 Prescription Request  07/05/2023  LOV: Visit date not found  What is the name of the medication or equipment? empagliflozin  (JARDIANCE ) 10 MG TABS tablet   Have you contacted your pharmacy to request a refill? Yes   Which pharmacy would you like this sent to?  [id number], [place] - [address] [place] [address] Phone: [phone] Fax: [phone]    Patient notified that their request is being sent to the clinical staff for review and that they should receive a response within 2 business days.   Please advise at Mobile [phone] (mobile)

## 2023-07-08 ENCOUNTER — Other Ambulatory Visit (HOSPITAL_COMMUNITY): Payer: Self-pay

## 2023-07-09 ENCOUNTER — Other Ambulatory Visit: Payer: Self-pay | Admitting: Pulmonary Disease

## 2023-07-09 ENCOUNTER — Other Ambulatory Visit

## 2023-07-09 DIAGNOSIS — J470 Bronchiectasis with acute lower respiratory infection: Secondary | ICD-10-CM

## 2023-07-09 NOTE — Telephone Encounter (Signed)
 Duplicate request, refilled 07/05/23 for 90 days.  Requested Prescriptions  Pending Prescriptions Disp Refills   empagliflozin  (JARDIANCE ) 10 MG TABS tablet 90 tablet 1    Sig: Take 1 tablet (10 mg total) by mouth daily.     Endocrinology:  Diabetes - SGLT2 Inhibitors Failed - 07/09/2023  8:16 AM      Failed - Cr in normal range and within 360 days    Creat  Date Value Ref Range Status  06/03/2023 1.22 (H) 0.60 - 1.00 mg/dL Final   Creatinine, POC  Date Value Ref Range Status  03/07/2023 106.3 mg/dL Final    Comment:    ABSTRACTED BY HIM   Creatinine, Urine  Date Value Ref Range Status  05/31/2014 17.81 (L) >20.0 mg/dL Final         Passed - HBA1C is between 0 and 7.9 and within 180 days    Hgb A1c MFr Bld  Date Value Ref Range Status  11/30/2022 6.0 (H) 4.8 - 5.6 % Final    Comment:    (NOTE) Pre diabetes:          5.7%-6.4%  Diabetes:              >6.4%  Glycemic control for   <7.0% adults with diabetes    A1c  Date Value Ref Range Status  03/07/2023 6.0  Final         Passed - eGFR in normal range and within 360 days    GFR, Est African American  Date Value Ref Range Status  08/25/2020 40 (L) > OR = 60 mL/min/1.29m2 Final   GFR, Est Non African American  Date Value Ref Range Status  08/25/2020 34 (L) > OR = 60 mL/min/1.49m2 Final   GFR, Estimated  Date Value Ref Range Status  05/24/2023 >60 >60 mL/min Final    Comment:    (NOTE) Calculated using the CKD-EPI Creatinine Equation (2021)   06/14/2022 51 (L) >60 mL/min Final    Comment:    (NOTE) Calculated using the CKD-EPI Creatinine Equation (2021)    GFR  Date Value Ref Range Status  05/03/2021 38.39 (L) >60.00 mL/min Final    Comment:    Calculated using the CKD-EPI Creatinine Equation (2021)   eGFR  Date Value Ref Range Status  05/02/2023 43 (L) > OR = 60 mL/min/1.16m2 Final  08/20/2022 38 (L) >59 mL/min/1.73 Final         Passed - Valid encounter within last 6 months    Recent  Outpatient Visits           1 month ago [facility]ammaglobulinemia [facility])   [place] [facility] Austine Lefort, MD   2 months ago Anemia, unspecified type   [place] [facility] Medicine Austine Lefort, MD   3 months ago Fever, unspecified fever cause   [place] [facility] Family Medicine Amadeo June, MD   4 months ago Fever, unspecified fever cause   [place] [facility] Medicine Austine Lefort, MD   5 months ago HFrEF (heart failure with reduced ejection fraction) [facility])   [place] [facility] [name], Cisco Crest, MD       Future Appointments             In 1 month Rice, Haig Levan, MD [facility] Health Rheumatology - A Dept Of [place]. [facility]   In 1 month Kozlow, Rema Care, MD  [facility]  [place] at [facility]

## 2023-07-15 DIAGNOSIS — R0602 Shortness of breath: Secondary | ICD-10-CM | POA: Diagnosis not present

## 2023-07-15 DIAGNOSIS — J4540 Moderate persistent asthma, uncomplicated: Secondary | ICD-10-CM | POA: Diagnosis not present

## 2023-07-15 DIAGNOSIS — J471 Bronchiectasis with (acute) exacerbation: Secondary | ICD-10-CM | POA: Diagnosis not present

## 2023-07-15 DIAGNOSIS — I5032 Chronic diastolic (congestive) heart failure: Secondary | ICD-10-CM | POA: Diagnosis not present

## 2023-07-15 DIAGNOSIS — G4736 Sleep related hypoventilation in conditions classified elsewhere: Secondary | ICD-10-CM | POA: Diagnosis not present

## 2023-07-15 DIAGNOSIS — M0600 Rheumatoid arthritis without rheumatoid factor, unspecified site: Secondary | ICD-10-CM | POA: Diagnosis not present

## 2023-07-15 DIAGNOSIS — G4733 Obstructive sleep apnea (adult) (pediatric): Secondary | ICD-10-CM | POA: Diagnosis not present

## 2023-07-15 DIAGNOSIS — J45909 Unspecified asthma, uncomplicated: Secondary | ICD-10-CM | POA: Diagnosis not present

## 2023-07-15 DIAGNOSIS — G894 Chronic pain syndrome: Secondary | ICD-10-CM | POA: Diagnosis not present

## 2023-07-16 NOTE — Progress Notes (Signed)
 Remote ICD transmission.

## 2023-07-16 NOTE — Addendum Note (Signed)
 Addended by: Lott Rouleau A on: 07/16/2023 11:19 AM   Modules accepted: Orders

## 2023-07-17 ENCOUNTER — Other Ambulatory Visit

## 2023-07-17 DIAGNOSIS — I130 Hypertensive heart and chronic kidney disease with heart failure and stage 1 through stage 4 chronic kidney disease, or unspecified chronic kidney disease: Secondary | ICD-10-CM | POA: Diagnosis not present

## 2023-07-17 DIAGNOSIS — I5022 Chronic systolic (congestive) heart failure: Secondary | ICD-10-CM | POA: Diagnosis not present

## 2023-07-17 DIAGNOSIS — J9621 Acute and chronic respiratory failure with hypoxia: Secondary | ICD-10-CM | POA: Diagnosis not present

## 2023-07-17 DIAGNOSIS — J188 Other pneumonia, unspecified organism: Secondary | ICD-10-CM | POA: Diagnosis not present

## 2023-07-17 DIAGNOSIS — R6520 Severe sepsis without septic shock: Secondary | ICD-10-CM | POA: Diagnosis not present

## 2023-07-17 DIAGNOSIS — A419 Sepsis, unspecified organism: Secondary | ICD-10-CM | POA: Diagnosis not present

## 2023-07-17 DIAGNOSIS — J4550 Severe persistent asthma, uncomplicated: Secondary | ICD-10-CM | POA: Diagnosis not present

## 2023-07-17 DIAGNOSIS — E1122 Type 2 diabetes mellitus with diabetic chronic kidney disease: Secondary | ICD-10-CM | POA: Diagnosis not present

## 2023-07-17 DIAGNOSIS — N1832 Chronic kidney disease, stage 3b: Secondary | ICD-10-CM | POA: Diagnosis not present

## 2023-07-19 ENCOUNTER — Other Ambulatory Visit: Payer: Self-pay | Admitting: Pulmonary Disease

## 2023-07-19 ENCOUNTER — Other Ambulatory Visit: Payer: Self-pay | Admitting: Cardiology

## 2023-07-19 ENCOUNTER — Other Ambulatory Visit (HOSPITAL_COMMUNITY): Payer: Self-pay

## 2023-07-19 ENCOUNTER — Other Ambulatory Visit: Payer: Self-pay | Admitting: Allergy and Immunology

## 2023-07-19 DIAGNOSIS — J4551 Severe persistent asthma with (acute) exacerbation: Secondary | ICD-10-CM

## 2023-07-19 NOTE — Telephone Encounter (Signed)
 Copied from CRM [id number]. Topic: Clinical - Medication Refill >> Jul 19, 2023  2:41 PM Corean Deutscher wrote: Medication: ipratropium-albuterol  (DUONEB) 0.5-2.5 (3) MG/3ML SOLN  Has the patient contacted their pharmacy? Yes (Agent: If no, request that the patient contact the pharmacy for the refill. If patient does not wish to contact the pharmacy document the reason why and proceed with request.) (Agent: If yes, when and what did the pharmacy advise?)  This is the patient's preferred pharmacy:  [id number], [place] - [address] [place] [address] Phone: [phone] Fax: [phone]  Is this the correct pharmacy for this prescription? Yes If no, delete pharmacy and type the correct one.   Has the prescription been filled recently? No  Is the patient out of the medication? No, will be out soon  Has the patient been seen for an appointment in the last year OR does the patient have an upcoming appointment? Yes  Can we respond through MyChart? No  Agent: Please be advised that Rx refills may take up to 3 business days. We ask that you follow-up with your pharmacy.

## 2023-07-19 NOTE — Telephone Encounter (Signed)
 Patient states that she is not having any new concerns or symptoms and she was just calling to get this prescription filled.  She has no further questions and was advised that if she should have any symptoms to seek medical attention at the Emergency Room. Patient verbalized understanding.

## 2023-07-19 NOTE — Telephone Encounter (Signed)
 Refill for azelastine  x 1 [facility]

## 2023-07-22 MED ORDER — IPRATROPIUM-ALBUTEROL 0.5-2.5 (3) MG/3ML IN SOLN: 3.0000 mL | Freq: Four times a day (QID) | RESPIRATORY_TRACT | 6 refills | Status: AC | PRN

## 2023-07-24 ENCOUNTER — Other Ambulatory Visit

## 2023-07-24 ENCOUNTER — Other Ambulatory Visit: Payer: Self-pay | Admitting: Pulmonary Disease

## 2023-07-24 NOTE — Telephone Encounter (Signed)
 Copied from CRM [id number]. Topic: Clinical - Medication Refill >> Jul 24, 2023  9:29 AM Whitney O wrote: Medication: albuterol  (PROVENTIL ) (2.5 MG/3ML) 0.083% nebulizer solution  Has the patient contacted their pharmacy? Yes just know running low  (Agent: If no, request that the patient contact the pharmacy for the refill. If patient does not wish to contact the pharmacy document the reason why and proceed with request.) (Agent: If yes, when and what did the pharmacy advise?)  This is the patient's preferred pharmacy:  [id number], [place] - [address] [place] [address] Phone: [phone] Fax: [phone]  Is this the correct pharmacy for this prescription? Yes If no, delete pharmacy and type the correct one.  [id number], [place] - [address] [place] [address] Phone: [phone] Fax: [phone]   Has the prescription been filled recently? No  Is the patient out of the medication? No very low   Has the patient been seen for an appointment in the last year OR does the patient have an upcoming appointment? Yes  Can we respond through MyChart? Yes  Agent: Please be advised that Rx refills may take up to 3 business days. We ask that you follow-up with your pharmacy.

## 2023-07-24 NOTE — Telephone Encounter (Signed)
 Last Fill: 01/01/23  Last OV: 02/04/23 Next OV: 09/18/23  Routing to provider for review/authorization.

## 2023-07-26 DIAGNOSIS — J4551 Severe persistent asthma with (acute) exacerbation: Secondary | ICD-10-CM | POA: Diagnosis not present

## 2023-07-29 ENCOUNTER — Ambulatory Visit: Admitting: Allergy and Immunology

## 2023-07-30 ENCOUNTER — Telehealth: Payer: Self-pay | Admitting: Pulmonary Disease

## 2023-07-30 DIAGNOSIS — J4550 Severe persistent asthma, uncomplicated: Secondary | ICD-10-CM | POA: Diagnosis not present

## 2023-07-30 DIAGNOSIS — J9621 Acute and chronic respiratory failure with hypoxia: Secondary | ICD-10-CM | POA: Diagnosis not present

## 2023-07-30 DIAGNOSIS — J188 Other pneumonia, unspecified organism: Secondary | ICD-10-CM | POA: Diagnosis not present

## 2023-07-30 DIAGNOSIS — E1122 Type 2 diabetes mellitus with diabetic chronic kidney disease: Secondary | ICD-10-CM | POA: Diagnosis not present

## 2023-07-30 DIAGNOSIS — N1832 Chronic kidney disease, stage 3b: Secondary | ICD-10-CM | POA: Diagnosis not present

## 2023-07-30 DIAGNOSIS — I130 Hypertensive heart and chronic kidney disease with heart failure and stage 1 through stage 4 chronic kidney disease, or unspecified chronic kidney disease: Secondary | ICD-10-CM | POA: Diagnosis not present

## 2023-07-30 DIAGNOSIS — I5022 Chronic systolic (congestive) heart failure: Secondary | ICD-10-CM | POA: Diagnosis not present

## 2023-07-30 DIAGNOSIS — A419 Sepsis, unspecified organism: Secondary | ICD-10-CM | POA: Diagnosis not present

## 2023-07-30 DIAGNOSIS — R6520 Severe sepsis without septic shock: Secondary | ICD-10-CM | POA: Diagnosis not present

## 2023-07-30 NOTE — Telephone Encounter (Signed)
 Copied from CRM [id number]. Topic: Referral - Prior Authorization Question >> Jul 30, 2023  8:53 AM Crist Dominion wrote: Reason for CRM: Tanya Fantasia from [facility] imaging is requesting the ordering provider for the CT CHEST WO CONTRAST - to please submit a new authorization request for the service as the order is listed for the wrong imaging center. Tanya Fantasia states that if this order is not fixed by 12 PM today 6/10 then she will need to cancel the patients appointment for tomorrow.   Auth #  [id number]

## 2023-07-31 ENCOUNTER — Other Ambulatory Visit

## 2023-07-31 ENCOUNTER — Ambulatory Visit
Admission: RE | Admit: 2023-07-31 | Discharge: 2023-07-31 | Disposition: A | Discharge status: Home / Self Care | Source: Ambulatory Visit | Attending: Pulmonary Disease | Admitting: Pulmonary Disease

## 2023-07-31 ENCOUNTER — Ambulatory Visit: Admitting: Family Medicine

## 2023-07-31 DIAGNOSIS — J189 Pneumonia, unspecified organism: Secondary | ICD-10-CM | POA: Diagnosis not present

## 2023-07-31 DIAGNOSIS — N1832 Chronic kidney disease, stage 3b: Secondary | ICD-10-CM | POA: Diagnosis not present

## 2023-07-31 DIAGNOSIS — J470 Bronchiectasis with acute lower respiratory infection: Secondary | ICD-10-CM

## 2023-07-31 DIAGNOSIS — J479 Bronchiectasis, uncomplicated: Secondary | ICD-10-CM | POA: Diagnosis not present

## 2023-07-31 NOTE — Progress Notes (Deleted)
 Office Visit Note  Patient: Ebony Scott             Date of Birth: January 13, 1951           MRN: [id number]             PCP: Austine Lefort, MD Referring: Austine Lefort, MD Visit Date: 08/14/2023   Subjective:  No chief complaint on file.   History of Present Illness: Ebony Scott is a 73 y.o. female here for follow up  with seropositive rheumatoid arthritis and osteoarthritis on prednisone  7 mg daily    Previous HPI 05/13/2023 Ebony Scott is a 73 year old female with seropositive rheumatoid arthritis and osteoarthritis on prednisone  7 mg daily who presents with joint pains, recent fall, and recent respiratory infections.   She experiences persistent joint pain, particularly in her fingers, with one finger that 'hurts all the time.' An injection in the finger last year did not provide significant relief. Shoulder pain, previously treated with an injection, was initially helpful but has since returned.   Regarding her rheumatoid arthritis, there have been no major flare-ups or joint swelling recently, with symptoms described as 'just the usual.' She is currently taking 50 mg of tramadol  daily, which she finds helpful, and also takes Tylenol  650 mg, two tablets in the morning and two in the evening.   She has been experiencing recurrent pneumonia, with the most recent episode occurring within the past couple of months. She is currently on a prednisone  dose of seven milligrams, which has raised concerns about her susceptibility to infections.   She experienced a fall recently when she slipped in the mud, but reports no other recent falls. No shooting pain down her arm or finger triggering. She also denies pain in her thumb or other fingers, except for the one that is sore.    Recent labs reviewed 05/02/23 CBC okay CMP eGFR 43 Rest okay       Previous HPI 12/11/2022 Ebony Scott is a 73 y.o. female here for follow up for seropositive RA on prednisone  7 mg daily.  Unfortunate  since the last visit she had pneumonia twice with exacerbation of her chronic bronchitis and is currently at facility for rehab after hospitalization.  Steroid injection to the finger at her last visit did not help much with regards to pain swelling or joint mobility.  She still noticing some redness and sometimes warmth to the joints near the tips of her fingers.  Worst problem currently is left shoulder pain.  She has difficulty reaching overhead also has pain worse at nighttime sometimes disrupting sleep.  Does not have symptoms radiating down the arm.   Previous HPI 09/10/2022 Ebony Scott is a 73 y.o. female here for follow up for seropositive RA on prednisone  7 mg daily.  She had recent hospitalization for fevers and hypotension without clear infectious source identified but concern of HCAP as this followed shortly after ICD placement on 7/8. She was treated with rocephin  and azithromycin  and transitioned to cefadroxil  and azithromycin  at discharge. She completed antibiotics but still has some diarrhea with frequent, watery stools though without any blood or mucus contents. She thinks the left shoulder steroid injection in April helped, but is currently limited unable to move her shoulder much after the recent ICD placement. Worst joint at present is right 5th PIP joint is very painful with movement and sometimes at rest.   Previous HPI 05/22/2022 Ebony Scott is a 73  y.o. female here for follow up for seropositive RA on prednisone  7 mg daily and we started hydroxychloroquine  200 mg daily.  Unfortunately did not see any significant benefit to joint inflammation with additional medication added.  She did get sick with community-acquired pneumonia resulting in hospitalization on March 6.  Respiratory status improved with treatment but unfortunately had a prolonged course 10 days until discharge.  Then had to go to short-term at rehab facility before returning home last weekend.  She was visited by home  health physical therapy once on Easter Sunday but has not had any additional follow-up or treatment.  She feels overall weak and mobility is much worse compared to prior to hospitalization.  Also developed new erythematous skin rash involving abdomen and neck.   Previous HPI 03/07/22 Ebony Scott is a 73 y.o. female here for follow up for seropositive RA on prednisone  7 mg daily. She has increased joint pain in multiple areas especially bilateral hands also left shoulder and right knee. She has seen only a small benefit in symptoms so far after the knee viscosupplementation shots last year. She has pain pretty much all the time both morning and night time. She notices some worsening of lateral deviation in the small finger on her right hand.    Previous HPI 11/29/21 Ebony Scott is a 73 y.o. female here for follow up for seronegative RA and cushing's syndrome currently on long term prednisone . She is on hizentra  for hypogammaglobulinemia. Since last visit she had a bone marrow biopsy for macrocytosis with benign findings. She is having an exacerbation of asthma with wheezing and coughing increased since early September and did not improve well with a prolonged steroid taper. Subsequently has not been prescribed azithromycin  and higher dose steroid starting about 5 days ago so far still having a lot of coughing and symptoms. She notices increased joint pain in hands and knees during this episode. She completed a series of 3 viscosupplementation injections in the left knee about 2 weeks ago with a partial benefit.   Previous HPI 08/09/2021 Ebony Scott is a 73 y.o. female here for follow up or seronegative RA and cushing's syndrome on prednisone  with gradual tapering but increased back to 7 mg daily after last visit.  She is having some increased joint pain at multiple areas but worst problem is increased pain in her left knee.  This is severe enough to limit her tolerance for weightbearing and mobility.   She is continuing treatments for hypogammaglobulinemia with the Hizentra  subcu weekly.  She has not had any severe interval infections.  She is scheduled for hammertoe corrective surgery next month.   Previous HPI 04/28/21 CHINIQUA KILCREASE is a 73 y.o. female here for follow up for seronegative RA and cushing's syndrome on prednisone  with gradual tapering from 8 mg in December. She developed worsening iron  deficiency anemia after GI bleed getting venofer  replacement. Joint and skin problems were remaining pretty stable until about 2 weeks ot a month ago. This was after decreasing prednisone  to 6 mg daily dose. Knee pain limits her mobility but hands have the most increase in swelling. Skin rash on the back of her neck and throughout her low back. She tried topical hydrocortisone  without a large improvement so far.   Previous HPI 01/23/21 NADEA KIRKLAND is a 73 y.o. female here for follow up for seronegative RA minimally responsive to multiple DMARD treatments on prednisone  10 mg PO daily with acquired cushing's syndrome. She has noticed some  increase in right hand MCP joint swelling but overall joints not dramatically changed with dose reduction. Since our visit she went for coronary artery stent placement after study for evaluating cardiomyopathy found significant stenosis. No anginal symptoms, she is not sure if any different but husband reports noticing some improvement in her dyspnea on exertion. She has had numerous other issues increased upper airway congestion and drainage. Sometimes productive coughing with this. She has had mild nasal bleeding with rhinitis and and sneezing issues.   Previous HPI 11/28/20 VERDELLE VALTIERRA is a 73 y.o. female here for iatrogenic cushing syndrome and history of rheumatoid arthritis with long term prednisone  treatment, previously seen by Dr. Bascom Lily at [facility] for this problem. Mostly had been taking treatments after hospitalization with severe joint inflammation in 2020. She has  tried taking numerous DMARD treatments for years including actemra , simponi , abatacept, and rituximab  so far without great response or tolerance. Chronic prednisone  required for symptoms throughout this time but has taken these longstanding for asthma disease already. She has had some issue with recurrent infections with pulmonary and septic bursitis and more recently noninfectious allergy  symptoms managed with Dr. Kozlow and Diania Fortes. She started on IVIG treatments with Dr. Kozlow for this identified recently, not sure if primary or related to previous medication   DMARD Hx Leflunomide  Hydroxychloroquine  Rituximab  Abatacept Simponi  Actemra  IV   No Rheumatology ROS completed.   PMFS History:  Patient Active Problem List   Diagnosis Date Noted   Acute metabolic acidosis 05/20/2023   RLL pneumonia 05/18/2023   Nocturnal hypoxia - 2-3 L/min 05/18/2023   Acute respiratory failure with hypoxia (HCC) 05/18/2023   Current chronic use of systemic steroids - prednisone  7 mg daily for hx of RA 05/18/2023   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   Sepsis without acute organ dysfunction (HCC) 11/30/2022   Fever 09/01/2022   DNR (do not resuscitate) 08/31/2022   Noninfectious otitis externa of left ear 06/08/2021   Iron  deficiency anemia 02/24/2021   GAVE (gastric antral vascular ectasia)    Anticoagulated    Antiplatelet or antithrombotic long-term use    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   Coronary artery calcification seen on CAT scan 01/11/2021   History of DVT (deep vein thrombosis) 09/24/2020   DMII (diabetes mellitus, type 2) (HCC) 09/23/2020   Hoarseness 09/09/2020   S/P reverse total shoulder arthroplasty, right    DOE (dyspnea on exertion) 02/18/2020   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   S/P reverse total shoulder arthroplasty, left 02/16/2020   History of pulmonary embolus (PE) 12/19/2018   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   Rheumatoid arthritis (HCC)     Bilateral impacted cerumen 08/18/2018   Hypothyroidism 01/14/2018   Depression 01/14/2018   Debility    Protein-calorie malnutrition, moderate (HCC) 12/17/2017   Knee pain    History of breast cancer    Chronic pain syndrome    Fibromyalgia    Graves disease    Dysphonia 08/10/2017   Pulmonary nodules    Chronic kidney disease, stage 3b (HCC) - baseline Scr 1.3-1.5 04/15/2016   Hx of adenomatous colonic polyps 04/12/2016   Opacity of lung on imaging study 11/03/2015   Severe persistent asthma 02/08/2015   Facial pain syndrome 02/08/2015   Insomnia 02/08/2015   Other allergic rhinitis 02/08/2015   LPRD (laryngopharyngeal reflux disease) 02/08/2015   Chronic systolic CHF (congestive heart failure) (HCC) 02/08/2015   Diastolic dysfunction 02/08/2015   Benign paroxysmal positional vertigo 12/03/2012   Disequilibrium 10/29/2012   Pseudogout 02/24/2012  Irritable bowel syndrome 01/02/2010   Hyperlipidemia 12/23/2009   Hyperthyroidism 11/12/2006   Seasonal and perennial allergic rhinitis 11/12/2006   GERD 11/12/2006   NECK PAIN, CHRONIC 11/12/2006   OSTEOPOROSIS 11/12/2006   BREAST CANCER, HX OF 11/12/2006    Past Medical History:  Diagnosis Date   Anemia    Angio-edema    Breast cancer (HCC) 1998   Left breast, in remission   Bronchiectasis (HCC)    CHF (congestive heart failure) (HCC)    Closed displaced fracture of metatarsal bone of right foot 01/20/2019   Closed fracture of fifth metatarsal bone of right foot, initial encounter 01/15/2018   Clostridium difficile colitis 01/14/2018   Coronary artery disease    Depression    some; don't take anything for it (02/20/2012), pt denies dx as of 06/02/21   Diverticulosis    DVT (deep venous thrombosis) (HCC)    Fibromyalgia 11/2011   Fracture of base of fifth metatarsal bone with routine healing, left    GAVE (gastric antral vascular ectasia)    GERD (gastroesophageal reflux disease)    Headache(784.0)    related to  allergies; more at different times during the year (02/20/2012)   Hemorrhoids    Hiatal hernia    back and neck   History of blood transfusion 2023   1 unit, 5 units of Iron    Hx of adenomatous colonic polyps 04/12/2016   Hypercholesteremia    good cholesterol is high   Hypothyroidism    h/o Graves disease   IBS (irritable bowel syndrome)    Moderate persistent asthma    -FeV1 72% 2011, -IgE 102 2011, CT sinus Neg 2011   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 Larrie Po   Sleep apnea    uses cpap nightly   Tracheobronchomalacia    Vasculitis (HCC) 12/13/2017    Family History  Problem Relation Age of Onset   Allergies Mother    Heart disease Mother    Arthritis Mother    Lung cancer Mother        heavy smoker   Diabetes Mother    Allergies Father    Heart disease Father    Arthritis Father    Stroke Father    Heart disease Brother    Diabetes Maternal Grandfather    Colitis Daughter    Colon cancer Other        Maternal half uncle   Past Surgical History:  Procedure Laterality Date   ABDOMINAL HYSTERECTOMY N/A    Phreesia 10/21/2019   ANTERIOR AND POSTERIOR REPAIR  1990's   APPENDECTOMY     ARGON LASER APPLICATION  02/05/2021   Procedure: ARGON LASER APPLICATION;  Surgeon: Ace Holder, MD;  Location: [facility];  Service: Gastroenterology;;   BREAST LUMPECTOMY  1998   left   BREAST SURGERY N/A    Phreesia 10/21/2019   BRONCHIAL WASHINGS  04/05/2020   Procedure: BRONCHIAL WASHINGS;  Surgeon: Wilfredo Hanly, MD;  Location: [facility];  Service: Pulmonary;;   CAPSULOTOMY Right 08/28/2021   Procedure: CAPSULOTOMY;  Surgeon: Velma Ghazi, DPM;  Location: [facility];  Service: Podiatry;  Laterality: Right;   CARPOMETACARPEL (CMC) FUSION OF  THUMB WITH AUTOGRAFT FROM RADIUS  ~ 2009   both thumbs (02/20/2012)  CATARACT EXTRACTION W/ INTRAOCULAR LENS  IMPLANT, BILATERAL  2012   CERVICAL DISCECTOMY  10/2001   C5-C6   CERVICAL FUSION  2003   C3-C4   CHOLECYSTECTOMY     COLONOSCOPY     CORONARY STENT INTERVENTION N/A 01/11/2021   Procedure: CORONARY STENT INTERVENTION;  Surgeon: Arleen Lacer, MD;  Location: [facility] INVASIVE CV LAB;  Service: Cardiovascular;  Laterality: N/A;   DEBRIDEMENT TENNIS ELBOW  ?1970's   right   ESOPHAGOGASTRODUODENOSCOPY     ESOPHAGOGASTRODUODENOSCOPY (EGD) WITH PROPOFOL  N/A 02/05/2021   Procedure: ESOPHAGOGASTRODUODENOSCOPY (EGD) WITH PROPOFOL ;  Surgeon: Ace Holder, MD;  Location: [facility];  Service: Gastroenterology;  Laterality: N/A;   EYE SURGERY N/A    Phreesia 10/21/2019   HAMMER TOE SURGERY Right 08/28/2021   Procedure: HAMMER TOE REPAIR 2ND TOE RIGHT FOOT;  Surgeon: Velma Ghazi, DPM;  Location: [facility];  Service: Podiatry;  Laterality: Right;   HYSTERECTOMY     I & D EXTREMITY Right 09/22/2020   Procedure: IRRIGATION AND DEBRIDEMENT RIGHT HAND AND ELBOW;  Surgeon: Osa Blase, MD;  Location: [facility];  Service: Orthopedics;  Laterality: Right;   ICD IMPLANT N/A 08/27/2022   Procedure: ICD IMPLANT;  Surgeon: Efraim Grange, MD;  Location: [facility];  Service: Cardiovascular;  Laterality: N/A;   KNEE ARTHROPLASTY  ?0865'H   ?right; w/cartilage repair (02/20/2012)   MASS EXCISION Left 08/28/2021   Procedure: EXCISION OF SOFT TISSUE  MASS LEFT FOOT;  Surgeon: Velma Ghazi, DPM;  Location: [facility];  Service: Podiatry;  Laterality: Left;   NASAL SEPTUM SURGERY  1980's   POSTERIOR CERVICAL FUSION/FORAMINOTOMY  2004   failed initial fusion; rewired  anterior neck (02/20/2012)   REVERSE SHOULDER ARTHROPLASTY Right 02/16/2020   Procedure: REVERSE SHOULDER ARTHROPLASTY;  Surgeon: Osa Blase, MD;  Location: [facility];  Service: Orthopedics;  Laterality: Right;   RIGHT/LEFT HEART  CATH AND CORONARY ANGIOGRAPHY N/A 01/11/2021   Procedure: RIGHT/LEFT HEART CATH AND CORONARY ANGIOGRAPHY;  Surgeon: Arleen Lacer, MD;  Location: [facility] INVASIVE CV LAB;  Service: Cardiovascular;  Laterality: N/A;   SPINE SURGERY N/A    Phreesia 10/21/2019   TONSILLECTOMY  ~ 1953   VESICOVAGINAL FISTULA CLOSURE W/ TAH  1988   VIDEO BRONCHOSCOPY Bilateral 08/23/2016   Procedure: VIDEO BRONCHOSCOPY WITH FLUORO;  Surgeon: Samual Crochet, MD;  Location: [facility];  Service: Cardiopulmonary;  Laterality: Bilateral;   VIDEO BRONCHOSCOPY N/A 04/05/2020   Procedure: VIDEO BRONCHOSCOPY WITHOUT FLUORO;  Surgeon: Wilfredo Hanly, MD;  Location: [facility];  Service: Pulmonary;  Laterality: N/A;   Social History   Social History Narrative   Patient lives at home alone. Patient  divorced.    Patient has her BS degree.   Right handed.   Caffeine- sometimes coffee.      [facility]:   Born in [place], [place]. She worked as a Armed forces operational officer. She has no pets currently. She does have indoor plants. Previously had mold in her home that was remediated. Carpet was removed.          Immunization History  Administered Date(s) Administered   DTaP 08/18/2013   Fluad Quad(high Dose 65+) 10/16/2018, 11/20/2019, 11/03/2020   Hepatitis A 09/04/2007, 03/02/2008   Hepatitis B 01/07/1985, 02/06/1985, 08/17/1985   Influenza Split 11/13/2010, 11/22/2011, 10/20/2012   Influenza Whole 11/14/2009, 11/21/2011   Influenza, High Dose Seasonal PF 11/09/2015, 11/27/2017   Influenza, Quadrivalent, Recombinant, Inj, Pf 11/20/2019   Influenza,inj,Quad PF,6+ Mos 11/06/2013, 11/09/2014, 11/06/2016  Influenza-Unspecified 11/06/2021, 10/12/2022   Meningococcal Conjugate 09/04/2007   Moderna Covid-19 Fall Seasonal Vaccine 53yrs & older 01/08/2022   PFIZER(Purple Top)SARS-COV-2 Vaccination 03/12/2019, 04/01/2019, 12/16/2019, 06/29/2020   Pfizer Covid-19 Vaccine Bivalent Booster 66yrs & up 03/02/2021    Pneumococcal Conjugate-13 05/18/2013   Pneumococcal Polysaccharide-23 01/05/1994, 11/28/2011, 05/27/2017   Respiratory Syncytial Virus Vaccine,Recomb Aduvanted(Arexvy) 11/06/2021   Td 07/24/1995, 03/16/2005   Tdap 08/18/2013   Zoster Recombinant(Shingrix) 09/18/2017   Zoster, Live 03/02/2008     Objective: Vital Signs: There were no vitals taken for this visit.   Physical Exam   Musculoskeletal Exam: ***  CDAI Exam: CDAI Score: -- Patient Global: --; Provider Global: -- Swollen: --; Tender: -- Joint Exam 08/14/2023   No joint exam has been documented for this visit   There is currently no information documented on the homunculus. Go to the Rheumatology activity and complete the homunculus joint exam.  Investigation: No additional findings.  Imaging: No results found.  Recent Labs: Lab Results  Component Value Date   WBC 8.3 06/03/2023   HGB 9.4 (L) 06/03/2023   PLT 187 06/03/2023   NA 138 06/03/2023   K 4.8 06/03/2023   CL 105 06/03/2023   CO2 25 06/03/2023   GLUCOSE 157 (H) 06/03/2023   BUN 24 06/03/2023   CREATININE 1.22 (H) 06/03/2023   BILITOT 0.6 05/24/2023   ALKPHOS 50 05/24/2023   AST 44 (H) 05/24/2023   ALT 53 (H) 05/24/2023   PROT 6.7 05/24/2023   ALBUMIN 2.4 (L) 05/24/2023   CALCIUM  8.8 06/03/2023   GFRAA 40 (L) 08/25/2020    Speciality Comments: PLQ Eye Exam: 04/18/2023 WNL @[facility] f/u 1 year  Procedures:  No procedures performed Allergies: Baclofen , Other, Pork-derived products, Shellfish allergy , Shrimp extract, Tetracycline hcl, Xolair  [omalizumab ], Zoledronic  acid, Celecoxib, Dilaudid  [hydromorphone  hcl], Hydrocodone -acetaminophen , Levofloxacin , Molds & smuts, Morphine  and codeine, Oxycodone  hcl, Paroxetine, Penicillins, Tetracycline hcl, Oxycodone -acetaminophen , Diltiazem  hcl, Dust mite extract, Gamunex [immune globulin ], Penicillin g procaine, Rituximab , Rofecoxib, and Tree extract   Assessment / Plan:     Visit Diagnoses: No  diagnosis found.  ***  Orders: No orders of the defined types were placed in this encounter.  No orders of the defined types were placed in this encounter.    Follow-Up Instructions: No follow-ups on file.   Glena Landau, RT  Note - This record has been created using [employer].  Chart creation errors have been sought, but may not always  have been located. Such creation errors do not reflect on  the standard of medical care.

## 2023-08-01 ENCOUNTER — Ambulatory Visit: Payer: Self-pay | Admitting: Family Medicine

## 2023-08-01 LAB — BASIC METABOLIC PANEL WITH GFR
BUN/Creatinine Ratio: 19 (calc) (ref 6–22)
BUN: 29 mg/dL — ABNORMAL HIGH (ref 7–25)
CO2: 25 mmol/L (ref 20–32)
Calcium: 10.3 mg/dL (ref 8.6–10.4)
Chloride: 105 mmol/L (ref 98–110)
Creat: 1.52 mg/dL — ABNORMAL HIGH (ref 0.60–1.00)
Glucose, Bld: 126 mg/dL — ABNORMAL HIGH (ref 65–99)
Potassium: 4.3 mmol/L (ref 3.5–5.3)
Sodium: 143 mmol/L (ref 135–146)
eGFR: 36 mL/min/{1.73_m2} — ABNORMAL LOW (ref >=60)

## 2023-08-02 ENCOUNTER — Encounter: Payer: Self-pay | Admitting: Family Medicine

## 2023-08-02 ENCOUNTER — Ambulatory Visit: Admitting: Family Medicine

## 2023-08-02 VITALS — BP 112/60 | HR 89 | Temp 98.4°F | Ht 62.0 in | Wt 130.0 lb

## 2023-08-02 DIAGNOSIS — H6122 Impacted cerumen, left ear: Secondary | ICD-10-CM | POA: Diagnosis not present

## 2023-08-02 DIAGNOSIS — H6993 Unspecified Eustachian tube disorder, bilateral: Secondary | ICD-10-CM | POA: Diagnosis not present

## 2023-08-02 DIAGNOSIS — R0982 Postnasal drip: Secondary | ICD-10-CM | POA: Diagnosis not present

## 2023-08-02 MED ORDER — FLUTICASONE PROPIONATE 50 MCG/ACT NA SUSP: 2.0000 | Freq: Every day | NASAL | 6 refills | Status: AC

## 2023-08-02 MED ORDER — AZELASTINE HCL 137 MCG/SPRAY NA SOLN: NASAL | 2 refills | Status: AC

## 2023-08-02 NOTE — Progress Notes (Signed)
 Patient Office Visit  Assessment & Plan:  Dysfunction of both eustachian tubes -     Fluticasone  Propionate; Place 2 sprays into both nostrils daily.  Dispense: 16 g; Refill: 6  Impacted cerumen of left ear -     Ear Lavage  Postnasal drip -     Azelastine  HCl; 2 sprays once per day  Dispense: 30 mL; Refill: 2   Assessment and Plan    Ear Irritation Left ear irritation with possible Eustachian tube dysfunction, no significant blockage or infection. - Remove cerumen from left ear. - Consider Nasonex  or Flonase  for postnasal drip.  Recurrent Pneumonia Recurrent pneumonia with planned lung function test and bronchoscopy for further investigation. - Await scheduling of lung function test and bronchoscopy.  Asthma Asthma stable with inhaler use, oxygen  saturation at baseline, uses nocturnal supplemental oxygen .  Rheumatoid Arthritis Rheumatoid arthritis effectively managed with immunosuppressive medication under rheumatologist care.  General Health Maintenance Blood pressure controlled, kidney function slightly abnormal but stable, non-smoker with secondhand smoke exposure history. - Follow up with rheumatologist for rheumatoid arthritis management. - Follow up with pulmonologist for lung function test and bronchoscopy.      She will call back if not improving    Return if symptoms worsen or fail to improve.   Subjective:    Patient ID: Ebony Scott, female    DOB: 09/23/1950  Age: 73 y.o. MRN: [id number]  Chief Complaint  Patient presents with   Ear Fullness    Left ear, present about 3 days    Ear Fullness    Discussed the use of AI scribe software for clinical note transcription with the patient, who gave verbal consent to proceed.  History of Present Illness   Ebony Scott is a 73 year old female with asthma and rheumatoid arthritis who presents with left ear irritation and congestion. patient also having postnasal drip.   She has been experiencing left ear  irritation and congestion for the past three days. There is no significant ear pain, fever, or chills. She describes the sensation as 'irritated' and notes that her ear feels like it pops when she swallows. She has a history of ear infections but no recent swimming activities.  She has asthma and uses an inhaler regularly. There is no recent increase in wheezing or shortness of breath. Her oxygen  levels are typically low, and she uses supplemental oxygen  at night. She has had multiple episodes of pneumonia over the past few years, requiring hospitalization, and is awaiting further pulmonary evaluation.   She has rheumatoid arthritis and is on immunosuppressive medication, which she reports is working 'okay'.  She experiences postnasal drip and congestion, for which she takes Zyrtec  and has used Astelin  nasal spray in the past, though she has not used it recently. She has allergies and reports feeling congested.  Her family history includes significant exposure to secondhand smoke from her parents, who were heavy smokers. She has never smoked.     Ebony Scott is a 73 year old female with asthma and rheumatoid arthritis who presents with left ear irritation and congestion. patient also having postnasal drip.   She has been experiencing left ear irritation and congestion for the past three days. There is no significant ear pain, fever, or chills. She describes the sensation as 'irritated' and notes that her ear feels like it pops when she swallows. She has a history of ear infections but no recent swimming activities.  She has asthma and uses an inhaler  regularly. There is no recent increase in wheezing or shortness of breath. Her oxygen  levels are typically low, and she uses supplemental oxygen  at night. She has had multiple episodes of pneumonia over the past few years, requiring hospitalization, and is awaiting further pulmonary evaluation.   She has rheumatoid arthritis and is on immunosuppressive  medication, which she reports is working 'okay'.  She experiences postnasal drip and congestion, for which she takes Zyrtec  and has used Astelin  nasal spray in the past, though she has not used it recently. She has allergies and reports feeling congested.  Her family history includes significant exposure to secondhand smoke from her parents, who were heavy smokers. She has never smoked. Physical Exam VITALS: SaO2- 92% HEENT: Left ear with cerumen, no redness. Right ear appears normal, possible fluid. CHEST: Lungs clear to auscultation, no wheezing. Results Procedure: Cerumen removal Description: Cerumen partially occluding the left ear canal was removed.  LABS Kidney function: abnormal (07/31/2023) Oxygen  level: 92% (08/02/2023) Assessment & Plan Ear Irritation Left ear irritation with possible Eustachian tube dysfunction, no significant blockage or infection. - Remove cerumen from left ear. - Consider Nasonex  or Flonase  for postnasal drip.  Recurrent Pneumonia Recurrent pneumonia with planned lung function test and bronchoscopy for further investigation. - Await scheduling of lung function test and bronchoscopy.  Asthma Asthma stable with inhaler use, oxygen  saturation at baseline, uses nocturnal supplemental oxygen .  Rheumatoid Arthritis Rheumatoid arthritis effectively managed with immunosuppressive medication under rheumatologist care.  General Health Maintenance Blood pressure controlled, kidney function slightly abnormal but stable, non-smoker with secondhand smoke exposure history. - Follow up with rheumatologist for rheumatoid arthritis management. - Follow up with pulmonologist for lung function test and bronchoscopy.    The ASCVD Risk score (Arnett DK, et al., 2019) failed to calculate for the following reasons:   The valid total cholesterol range is 130 to 320 mg/dL  Past Medical History:  Diagnosis Date   Anemia    Angio-edema    Breast cancer (HCC) 1998    Left breast, in remission   Bronchiectasis (HCC)    CHF (congestive heart failure) (HCC)    Closed displaced fracture of metatarsal bone of right foot 01/20/2019   Closed fracture of fifth metatarsal bone of right foot, initial encounter 01/15/2018   Clostridium difficile colitis 01/14/2018   Coronary artery disease    Depression    some; don't take anything for it (02/20/2012), pt denies dx as of 06/02/21   Diverticulosis    DVT (deep venous thrombosis) (HCC)    Fibromyalgia 11/2011   Fracture of base of fifth metatarsal bone with routine healing, left    GAVE (gastric antral vascular ectasia)    GERD (gastroesophageal reflux disease)    Headache(784.0)    related to allergies; more at different times during the year (02/20/2012)   Hemorrhoids    Hiatal hernia    back and neck   History of blood transfusion 2023   1 unit, 5 units of Iron    Hx of adenomatous colonic polyps 04/12/2016   Hypercholesteremia    good cholesterol is high   Hypothyroidism    h/o Graves disease   IBS (irritable bowel syndrome)    Moderate persistent asthma    -FeV1 72% 2011, -IgE 102 2011, CT sinus Neg 2011   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 Govinda Aryal   Sleep apnea    uses cpap nightly   Tracheobronchomalacia    Vasculitis (HCC) 12/13/2017   Past Surgical History:  Procedure Laterality Date   ABDOMINAL HYSTERECTOMY N/A    Phreesia 10/21/2019   ANTERIOR AND POSTERIOR REPAIR  1990's   APPENDECTOMY     ARGON LASER APPLICATION  02/05/2021   Procedure: ARGON LASER APPLICATION;  Surgeon: Ace Holder, MD;  Location: [facility];  Service: Gastroenterology;;   BREAST LUMPECTOMY  1998   left   BREAST SURGERY N/A    Phreesia 10/21/2019   BRONCHIAL WASHINGS  04/05/2020    Procedure: BRONCHIAL WASHINGS;  Surgeon: Wilfredo Hanly, MD;  Location: [facility];  Service: Pulmonary;;   CAPSULOTOMY Right 08/28/2021   Procedure: CAPSULOTOMY;  Surgeon: Velma Ghazi, DPM;  Location: [facility];  Service: Podiatry;  Laterality: Right;   CARPOMETACARPEL (CMC) FUSION OF THUMB WITH AUTOGRAFT FROM RADIUS  ~ 2009   both thumbs (02/20/2012)   CATARACT EXTRACTION W/ INTRAOCULAR LENS  IMPLANT, BILATERAL  2012   CERVICAL DISCECTOMY  10/2001   C5-C6   CERVICAL FUSION  2003   C3-C4   CHOLECYSTECTOMY     COLONOSCOPY     CORONARY STENT INTERVENTION N/A 01/11/2021   Procedure: CORONARY STENT INTERVENTION;  Surgeon: Arleen Lacer, MD;  Location: [facility]AS[facility] CV [facility];  Service: Cardiovascular;  Laterality: N/A;   DEBRIDEMENT TENNIS ELBOW  ?1970's   right   ESOPHAGOGASTRODUODENOSCOPY     ESOPHAGOGASTRODUODENOSCOPY (EGD) WITH PROPOFOL  N/A 02/05/2021   Procedure: ESOPHAGOGASTRODUODENOSCOPY (EGD) WITH PROPOFOL ;  Surgeon: Ace Holder, MD;  Location: [facility];  Service: Gastroenterology;  Laterality: N/A;   EYE SURGERY N/A    Phreesia 10/21/2019   HAMMER TOE SURGERY Right 08/28/2021   Procedure: HAMMER TOE REPAIR 2ND TOE RIGHT FOOT;  Surgeon: Velma Ghazi, DPM;  Location: [facility];  Service: Podiatry;  Laterality: Right;   HYSTERECTOMY     I & D EXTREMITY Right 09/22/2020   Procedure: IRRIGATION AND DEBRIDEMENT RIGHT HAND AND ELBOW;  Surgeon: Osa Blase, MD;  Location: [facility];  Service: Orthopedics;  Laterality: Right;   ICD IMPLANT N/A 08/27/2022   Procedure: ICD IMPLANT;  Surgeon: Efraim Grange, MD;  Location: [facility];  Service: Cardiovascular;  Laterality: N/A;   KNEE ARTHROPLASTY  ?4540'J   ?right; w/cartilage repair (02/20/2012)   MASS EXCISION Left 08/28/2021   Procedure: EXCISION OF SOFT TISSUE  MASS LEFT FOOT;  Surgeon: Velma Ghazi, DPM;  Location: [facility];  Service: Podiatry;  Laterality: Left;   NASAL SEPTUM SURGERY  1980's   POSTERIOR CERVICAL  FUSION/FORAMINOTOMY  2004   failed initial fusion; rewired  anterior neck (02/20/2012)   REVERSE SHOULDER ARTHROPLASTY Right 02/16/2020   Procedure: REVERSE SHOULDER ARTHROPLASTY;  Surgeon: Osa Blase, MD;  Location: [facility];  Service: Orthopedics;  Laterality: Right;   RIGHT/LEFT HEART CATH AND CORONARY ANGIOGRAPHY N/A 01/11/2021   Procedure: RIGHT/LEFT HEART CATH AND CORONARY ANGIOGRAPHY;  Surgeon: Arleen Lacer, MD;  Location: [facility] INVASIVE CV LAB;  Service: Cardiovascular;  Laterality: N/A;   SPINE SURGERY N/A    Phreesia 10/21/2019   TONSILLECTOMY  ~ 1953   VESICOVAGINAL FISTULA CLOSURE W/ TAH  1988   VIDEO BRONCHOSCOPY Bilateral 08/23/2016   Procedure: VIDEO BRONCHOSCOPY WITH FLUORO;  Surgeon: Samual Crochet, MD;  Location: [facility];  Service: Cardiopulmonary;  Laterality: Bilateral;   VIDEO BRONCHOSCOPY N/A  04/05/2020   Procedure: VIDEO BRONCHOSCOPY WITHOUT FLUORO;  Surgeon: Wilfredo Hanly, MD;  Location: [facility];  Service: Pulmonary;  Laterality: N/A;   Social History   Tobacco Use   Smoking status: Never    Passive exposure: Past   Smokeless tobacco: Never   Tobacco comments:    Parents  Vaping Use   Vaping status: Never Used  Substance Use Topics   Alcohol  use: Not Currently    Alcohol /week: 0.0 standard drinks of alcohol    Drug use: No   Family History  Problem Relation Age of Onset   Allergies Mother    Heart disease Mother    Arthritis Mother    Lung cancer Mother        heavy smoker   Diabetes Mother    Allergies Father    Heart disease Father    Arthritis Father    Stroke Father    Heart disease Brother    Diabetes Maternal Grandfather    Colitis Daughter    Colon cancer Other        Maternal half uncle   Allergies  Allergen Reactions   Baclofen  Anaphylaxis and Other (See Comments)    Altered mental status requiring a 3-day hospital stay- patient became unresponsive   Put me in a coma for five days, Altered mental status  requiring a 3-day hospital stay- patient became unresponsive   Other Shortness Of Breath and Other (See Comments)    Grass and weeds sneezing; filled sinuses (02/20/2012)   Pork-Derived Products Anaphylaxis   Shellfish Allergy  Anaphylaxis, Shortness Of Breath, Itching and Rash   Shrimp Extract Anaphylaxis and Other (See Comments)    ALL SHELLFISH   Tetracycline Hcl Nausea And Vomiting   Xolair  [Omalizumab ] Other (See Comments)    Caused Blood clot   Zoledronic  Acid Other (See Comments)    Reclast - Fever, Put in hospital, dr said it was a reaction from a reaction    Fever, inability to walk, Put in hospital  Fever, Put in hospital, dr said it was a reaction from a reaction , Other reaction(s): Unknown   Celecoxib Swelling    Feet swelling   Dilaudid  [Hydromorphone  Hcl] Itching   Hydrocodone -Acetaminophen  Itching   Levofloxacin  Other (See Comments)    GI upset   Molds & Smuts Cough   Morphine  And Codeine Hives and Itching   Oxycodone  Hcl Nausea And Vomiting    GI Intolerance   Paroxetine Nausea And Vomiting and Other (See Comments)    GI Intolerance also   Penicillins Rash and Other (See Comments)    welts (02/20/2012)  Tolerated Ancef  02/16/20   Tetracycline Hcl Other (See Comments)    GI Intolerance   Oxycodone -Acetaminophen  Itching   Diltiazem  Hcl Swelling   Dust Mite Extract Other (See Comments) and Cough    sneezing (02/20/2012) too   Gamunex [Immune Globulin ] Itching and Rash    Tolerates hizentra     Penicillin G Procaine Rash and Other (See Comments)    Welts, also   Rituximab  Other (See Comments)    Anemia    Rofecoxib Swelling    Vioxx- feet swelling   Tree Extract Itching    tested and told I was allergic to it; never experienced a reaction to it (02/20/2012)    ROS    Objective:    BP 112/60   Pulse 89   Temp 98.4 F (36.9 C) (Tympanic)   SpO2 92%  BP Readings from Last 3 Encounters:  08/02/23 112/60  06/03/23 [name]  110/46  05/24/23 (!) 157/71    Wt Readings from Last 3 Encounters:  06/03/23 133 lb (60.3 kg)  05/21/23 127 lb 11.8 oz (57.9 kg)  05/13/23 130 lb (59 kg)    Physical Exam Vitals and nursing note reviewed.  Constitutional:      Appearance: Normal appearance.     Comments: Using walker  HENT:     Head: Normocephalic.     Comments: Left TM partially obscured by cerumen, after lavage TM visualized, decreased mobility, nonerythematous/nonbulging    Right Ear: Tympanic membrane, ear canal and external ear normal.     Left Ear: Tympanic membrane, ear canal and external ear normal. There is impacted cerumen.   Eyes:     Extraocular Movements: Extraocular movements intact.     Pupils: Pupils are equal, round, and reactive to light.    Cardiovascular:     Rate and Rhythm: Normal rate and regular rhythm.     Heart sounds: Normal heart sounds.  Pulmonary:     Effort: Pulmonary effort is normal.     Breath sounds: Normal breath sounds. No wheezing.   Musculoskeletal:     Right lower leg: No edema.     Left lower leg: No edema.   Neurological:     General: No focal deficit present.     Mental Status: She is alert and oriented to person, place, and time.   Psychiatric:        Mood and Affect: Mood normal.      No results found for any visits on 08/02/23.

## 2023-08-06 ENCOUNTER — Emergency Department (HOSPITAL_COMMUNITY)

## 2023-08-06 ENCOUNTER — Other Ambulatory Visit: Payer: Self-pay

## 2023-08-06 ENCOUNTER — Encounter (HOSPITAL_COMMUNITY): Payer: Self-pay | Admitting: Student

## 2023-08-06 ENCOUNTER — Inpatient Hospital Stay (HOSPITAL_COMMUNITY)
Admission: EM | Admit: 2023-08-06 | Discharge: 2023-08-22 | DRG: 260 | Disposition: A | Discharge status: Skilled Nursing Facility | Attending: Internal Medicine | Admitting: Internal Medicine

## 2023-08-06 DIAGNOSIS — B957 Other staphylococcus as the cause of diseases classified elsewhere: Secondary | ICD-10-CM | POA: Diagnosis not present

## 2023-08-06 DIAGNOSIS — I38 Endocarditis, valve unspecified: Secondary | ICD-10-CM | POA: Diagnosis not present

## 2023-08-06 DIAGNOSIS — I255 Ischemic cardiomyopathy: Secondary | ICD-10-CM | POA: Diagnosis not present

## 2023-08-06 DIAGNOSIS — Z888 Allergy status to other drugs, medicaments and biological substances status: Secondary | ICD-10-CM

## 2023-08-06 DIAGNOSIS — R0602 Shortness of breath: Secondary | ICD-10-CM | POA: Diagnosis not present

## 2023-08-06 DIAGNOSIS — N179 Acute kidney failure, unspecified: Secondary | ICD-10-CM

## 2023-08-06 DIAGNOSIS — R7881 Bacteremia: Secondary | ICD-10-CM | POA: Diagnosis not present

## 2023-08-06 DIAGNOSIS — J189 Pneumonia, unspecified organism: Principal | ICD-10-CM | POA: Diagnosis present

## 2023-08-06 DIAGNOSIS — J471 Bronchiectasis with (acute) exacerbation: Secondary | ICD-10-CM | POA: Diagnosis not present

## 2023-08-06 DIAGNOSIS — Z95810 Presence of automatic (implantable) cardiac defibrillator: Secondary | ICD-10-CM | POA: Diagnosis not present

## 2023-08-06 DIAGNOSIS — G4733 Obstructive sleep apnea (adult) (pediatric): Secondary | ICD-10-CM | POA: Diagnosis present

## 2023-08-06 DIAGNOSIS — M6281 Muscle weakness (generalized): Secondary | ICD-10-CM | POA: Diagnosis not present

## 2023-08-06 DIAGNOSIS — T827XXA Infection and inflammatory reaction due to other cardiac and vascular devices, implants and grafts, initial encounter: Principal | ICD-10-CM | POA: Diagnosis present

## 2023-08-06 DIAGNOSIS — E039 Hypothyroidism, unspecified: Secondary | ICD-10-CM | POA: Diagnosis present

## 2023-08-06 DIAGNOSIS — Z886 Allergy status to analgesic agent status: Secondary | ICD-10-CM

## 2023-08-06 DIAGNOSIS — I130 Hypertensive heart and chronic kidney disease with heart failure and stage 1 through stage 4 chronic kidney disease, or unspecified chronic kidney disease: Secondary | ICD-10-CM | POA: Diagnosis present

## 2023-08-06 DIAGNOSIS — I959 Hypotension, unspecified: Secondary | ICD-10-CM | POA: Diagnosis not present

## 2023-08-06 DIAGNOSIS — Z86718 Personal history of other venous thrombosis and embolism: Secondary | ICD-10-CM

## 2023-08-06 DIAGNOSIS — N189 Chronic kidney disease, unspecified: Secondary | ICD-10-CM | POA: Diagnosis not present

## 2023-08-06 DIAGNOSIS — D801 Nonfamilial hypogammaglobulinemia: Secondary | ICD-10-CM | POA: Diagnosis present

## 2023-08-06 DIAGNOSIS — I5022 Chronic systolic (congestive) heart failure: Secondary | ICD-10-CM | POA: Diagnosis present

## 2023-08-06 DIAGNOSIS — Z86711 Personal history of pulmonary embolism: Secondary | ICD-10-CM

## 2023-08-06 DIAGNOSIS — Z981 Arthrodesis status: Secondary | ICD-10-CM

## 2023-08-06 DIAGNOSIS — Z8261 Family history of arthritis: Secondary | ICD-10-CM

## 2023-08-06 DIAGNOSIS — D631 Anemia in chronic kidney disease: Secondary | ICD-10-CM | POA: Diagnosis present

## 2023-08-06 DIAGNOSIS — J470 Bronchiectasis with acute lower respiratory infection: Secondary | ICD-10-CM | POA: Diagnosis present

## 2023-08-06 DIAGNOSIS — R262 Difficulty in walking, not elsewhere classified: Secondary | ICD-10-CM | POA: Diagnosis not present

## 2023-08-06 DIAGNOSIS — M0600 Rheumatoid arthritis without rheumatoid factor, unspecified site: Secondary | ICD-10-CM | POA: Diagnosis not present

## 2023-08-06 DIAGNOSIS — I951 Orthostatic hypotension: Secondary | ICD-10-CM | POA: Diagnosis not present

## 2023-08-06 DIAGNOSIS — Z90710 Acquired absence of both cervix and uterus: Secondary | ICD-10-CM

## 2023-08-06 DIAGNOSIS — Z885 Allergy status to narcotic agent status: Secondary | ICD-10-CM

## 2023-08-06 DIAGNOSIS — I9589 Other hypotension: Secondary | ICD-10-CM | POA: Diagnosis not present

## 2023-08-06 DIAGNOSIS — Z604 Social exclusion and rejection: Secondary | ICD-10-CM | POA: Diagnosis present

## 2023-08-06 DIAGNOSIS — M797 Fibromyalgia: Secondary | ICD-10-CM | POA: Diagnosis present

## 2023-08-06 DIAGNOSIS — Z7984 Long term (current) use of oral hypoglycemic drugs: Secondary | ICD-10-CM

## 2023-08-06 DIAGNOSIS — G4736 Sleep related hypoventilation in conditions classified elsewhere: Secondary | ICD-10-CM | POA: Diagnosis not present

## 2023-08-06 DIAGNOSIS — I071 Rheumatic tricuspid insufficiency: Secondary | ICD-10-CM | POA: Diagnosis present

## 2023-08-06 DIAGNOSIS — Z955 Presence of coronary angioplasty implant and graft: Secondary | ICD-10-CM

## 2023-08-06 DIAGNOSIS — I2510 Atherosclerotic heart disease of native coronary artery without angina pectoris: Secondary | ICD-10-CM | POA: Diagnosis present

## 2023-08-06 DIAGNOSIS — A4181 Sepsis due to Enterococcus: Secondary | ICD-10-CM | POA: Diagnosis present

## 2023-08-06 DIAGNOSIS — J45909 Unspecified asthma, uncomplicated: Secondary | ICD-10-CM | POA: Diagnosis not present

## 2023-08-06 DIAGNOSIS — Z7901 Long term (current) use of anticoagulants: Secondary | ICD-10-CM

## 2023-08-06 DIAGNOSIS — Z7401 Bed confinement status: Secondary | ICD-10-CM | POA: Diagnosis not present

## 2023-08-06 DIAGNOSIS — Z833 Family history of diabetes mellitus: Secondary | ICD-10-CM

## 2023-08-06 DIAGNOSIS — I493 Ventricular premature depolarization: Secondary | ICD-10-CM | POA: Diagnosis present

## 2023-08-06 DIAGNOSIS — R509 Fever, unspecified: Secondary | ICD-10-CM | POA: Diagnosis not present

## 2023-08-06 DIAGNOSIS — G894 Chronic pain syndrome: Secondary | ICD-10-CM | POA: Diagnosis present

## 2023-08-06 DIAGNOSIS — Z5181 Encounter for therapeutic drug level monitoring: Secondary | ICD-10-CM | POA: Diagnosis not present

## 2023-08-06 DIAGNOSIS — I82499 Acute embolism and thrombosis of other specified deep vein of unspecified lower extremity: Secondary | ICD-10-CM

## 2023-08-06 DIAGNOSIS — E1122 Type 2 diabetes mellitus with diabetic chronic kidney disease: Secondary | ICD-10-CM | POA: Diagnosis present

## 2023-08-06 DIAGNOSIS — E861 Hypovolemia: Secondary | ICD-10-CM | POA: Diagnosis present

## 2023-08-06 DIAGNOSIS — I5020 Unspecified systolic (congestive) heart failure: Secondary | ICD-10-CM

## 2023-08-06 DIAGNOSIS — Z96611 Presence of right artificial shoulder joint: Secondary | ICD-10-CM | POA: Diagnosis present

## 2023-08-06 DIAGNOSIS — R918 Other nonspecific abnormal finding of lung field: Secondary | ICD-10-CM | POA: Diagnosis not present

## 2023-08-06 DIAGNOSIS — Z9049 Acquired absence of other specified parts of digestive tract: Secondary | ICD-10-CM

## 2023-08-06 DIAGNOSIS — E7800 Pure hypercholesterolemia, unspecified: Secondary | ICD-10-CM | POA: Diagnosis present

## 2023-08-06 DIAGNOSIS — B952 Enterococcus as the cause of diseases classified elsewhere: Secondary | ICD-10-CM | POA: Insufficient documentation

## 2023-08-06 DIAGNOSIS — J4540 Moderate persistent asthma, uncomplicated: Secondary | ICD-10-CM | POA: Diagnosis present

## 2023-08-06 DIAGNOSIS — R053 Chronic cough: Secondary | ICD-10-CM | POA: Diagnosis not present

## 2023-08-06 DIAGNOSIS — R0689 Other abnormalities of breathing: Secondary | ICD-10-CM | POA: Diagnosis not present

## 2023-08-06 DIAGNOSIS — N170 Acute kidney failure with tubular necrosis: Secondary | ICD-10-CM | POA: Diagnosis present

## 2023-08-06 DIAGNOSIS — Z9842 Cataract extraction status, left eye: Secondary | ICD-10-CM

## 2023-08-06 DIAGNOSIS — M059 Rheumatoid arthritis with rheumatoid factor, unspecified: Secondary | ICD-10-CM | POA: Diagnosis present

## 2023-08-06 DIAGNOSIS — A498 Other bacterial infections of unspecified site: Secondary | ICD-10-CM | POA: Insufficient documentation

## 2023-08-06 DIAGNOSIS — J4550 Severe persistent asthma, uncomplicated: Secondary | ICD-10-CM | POA: Diagnosis not present

## 2023-08-06 DIAGNOSIS — F32A Depression, unspecified: Secondary | ICD-10-CM | POA: Diagnosis present

## 2023-08-06 DIAGNOSIS — Z1152 Encounter for screening for COVID-19: Secondary | ICD-10-CM

## 2023-08-06 DIAGNOSIS — M1120 Other chondrocalcinosis, unspecified site: Secondary | ICD-10-CM

## 2023-08-06 DIAGNOSIS — Z8249 Family history of ischemic heart disease and other diseases of the circulatory system: Secondary | ICD-10-CM

## 2023-08-06 DIAGNOSIS — J984 Other disorders of lung: Secondary | ICD-10-CM | POA: Diagnosis not present

## 2023-08-06 DIAGNOSIS — Z91013 Allergy to seafood: Secondary | ICD-10-CM

## 2023-08-06 DIAGNOSIS — J9611 Chronic respiratory failure with hypoxia: Secondary | ICD-10-CM | POA: Diagnosis not present

## 2023-08-06 DIAGNOSIS — I272 Pulmonary hypertension, unspecified: Secondary | ICD-10-CM | POA: Diagnosis present

## 2023-08-06 DIAGNOSIS — Z79899 Other long term (current) drug therapy: Secondary | ICD-10-CM

## 2023-08-06 DIAGNOSIS — Z881 Allergy status to other antibiotic agents status: Secondary | ICD-10-CM

## 2023-08-06 DIAGNOSIS — Z7902 Long term (current) use of antithrombotics/antiplatelets: Secondary | ICD-10-CM

## 2023-08-06 DIAGNOSIS — M19011 Primary osteoarthritis, right shoulder: Secondary | ICD-10-CM

## 2023-08-06 DIAGNOSIS — Z823 Family history of stroke: Secondary | ICD-10-CM

## 2023-08-06 DIAGNOSIS — R531 Weakness: Secondary | ICD-10-CM

## 2023-08-06 DIAGNOSIS — Y838 Other surgical procedures as the cause of abnormal reaction of the patient, or of later complication, without mention of misadventure at the time of the procedure: Secondary | ICD-10-CM | POA: Diagnosis present

## 2023-08-06 DIAGNOSIS — Z961 Presence of intraocular lens: Secondary | ICD-10-CM | POA: Diagnosis present

## 2023-08-06 DIAGNOSIS — E876 Hypokalemia: Secondary | ICD-10-CM | POA: Diagnosis present

## 2023-08-06 DIAGNOSIS — Z8701 Personal history of pneumonia (recurrent): Secondary | ICD-10-CM

## 2023-08-06 DIAGNOSIS — J9621 Acute and chronic respiratory failure with hypoxia: Secondary | ICD-10-CM | POA: Diagnosis present

## 2023-08-06 DIAGNOSIS — Z800 Family history of malignant neoplasm of digestive organs: Secondary | ICD-10-CM

## 2023-08-06 DIAGNOSIS — K219 Gastro-esophageal reflux disease without esophagitis: Secondary | ICD-10-CM | POA: Diagnosis present

## 2023-08-06 DIAGNOSIS — Z9841 Cataract extraction status, right eye: Secondary | ICD-10-CM

## 2023-08-06 DIAGNOSIS — Z7952 Long term (current) use of systemic steroids: Secondary | ICD-10-CM

## 2023-08-06 DIAGNOSIS — Z66 Do not resuscitate: Secondary | ICD-10-CM | POA: Diagnosis present

## 2023-08-06 DIAGNOSIS — R1084 Generalized abdominal pain: Secondary | ICD-10-CM | POA: Diagnosis not present

## 2023-08-06 DIAGNOSIS — Z880 Allergy status to penicillin: Secondary | ICD-10-CM

## 2023-08-06 DIAGNOSIS — E119 Type 2 diabetes mellitus without complications: Secondary | ICD-10-CM | POA: Diagnosis not present

## 2023-08-06 DIAGNOSIS — T501X5A Adverse effect of loop [high-ceiling] diuretics, initial encounter: Secondary | ICD-10-CM | POA: Diagnosis not present

## 2023-08-06 DIAGNOSIS — Z801 Family history of malignant neoplasm of trachea, bronchus and lung: Secondary | ICD-10-CM

## 2023-08-06 DIAGNOSIS — R058 Other specified cough: Secondary | ICD-10-CM | POA: Diagnosis not present

## 2023-08-06 DIAGNOSIS — I5032 Chronic diastolic (congestive) heart failure: Secondary | ICD-10-CM | POA: Diagnosis not present

## 2023-08-06 DIAGNOSIS — M810 Age-related osteoporosis without current pathological fracture: Secondary | ICD-10-CM | POA: Diagnosis present

## 2023-08-06 DIAGNOSIS — N1832 Chronic kidney disease, stage 3b: Secondary | ICD-10-CM | POA: Diagnosis present

## 2023-08-06 DIAGNOSIS — I10 Essential (primary) hypertension: Secondary | ICD-10-CM | POA: Diagnosis not present

## 2023-08-06 DIAGNOSIS — Z853 Personal history of malignant neoplasm of breast: Secondary | ICD-10-CM

## 2023-08-06 DIAGNOSIS — Z860101 Personal history of adenomatous and serrated colon polyps: Secondary | ICD-10-CM

## 2023-08-06 LAB — CBC WITH DIFFERENTIAL/PLATELET
Abs Immature Granulocytes: 0.04 10*3/uL (ref 0.00–0.07)
Basophils Absolute: 0 10*3/uL (ref 0.0–0.1)
Basophils Relative: 0 %
Eosinophils Absolute: 0.1 10*3/uL (ref 0.0–0.5)
Eosinophils Relative: 1 %
HCT: 31.5 % — ABNORMAL LOW (ref 36.0–46.0)
Hemoglobin: 9.7 g/dL — ABNORMAL LOW (ref 12.0–15.0)
Immature Granulocytes: 0 %
Lymphocytes Relative: 5 %
Lymphs Abs: 0.5 10*3/uL — ABNORMAL LOW (ref 0.7–4.0)
MCH: 33.3 pg (ref 26.0–34.0)
MCHC: 30.8 g/dL (ref 30.0–36.0)
MCV: 108.2 fL — ABNORMAL HIGH (ref 80.0–100.0)
Monocytes Absolute: 1 10*3/uL (ref 0.1–1.0)
Monocytes Relative: 11 %
Neutro Abs: 7.4 10*3/uL (ref 1.7–7.7)
Neutrophils Relative %: 83 %
Platelets: 140 10*3/uL — ABNORMAL LOW (ref 150–400)
RBC: 2.91 MIL/uL — ABNORMAL LOW (ref 3.87–5.11)
RDW: 15.8 % — ABNORMAL HIGH (ref 11.5–15.5)
WBC: 9 10*3/uL (ref 4.0–10.5)
nRBC: 0 % (ref 0.0–0.2)

## 2023-08-06 LAB — URINALYSIS, W/ REFLEX TO CULTURE (INFECTION SUSPECTED)
Bacteria, UA: NONE SEEN
Bilirubin Urine: NEGATIVE
Glucose, UA: >=500 mg/dL — AB
Hgb urine dipstick: NEGATIVE
Ketones, ur: NEGATIVE mg/dL
Leukocytes,Ua: NEGATIVE
Nitrite: NEGATIVE
Protein, ur: NEGATIVE mg/dL
Specific Gravity, Urine: 1.023 (ref 1.005–1.030)
pH: 5 (ref 5.0–8.0)

## 2023-08-06 LAB — COMPREHENSIVE METABOLIC PANEL WITH GFR
ALT: 26 U/L (ref 0–44)
AST: 37 U/L (ref 15–41)
Albumin: 2.9 g/dL — ABNORMAL LOW (ref 3.5–5.0)
Alkaline Phosphatase: 40 U/L (ref 38–126)
Anion gap: 10 (ref 5–15)
BUN: 37 mg/dL — ABNORMAL HIGH (ref 8–23)
CO2: 24 mmol/L (ref 22–32)
Calcium: 9.1 mg/dL (ref 8.9–10.3)
Chloride: 107 mmol/L (ref 98–111)
Creatinine, Ser: 2.14 mg/dL — ABNORMAL HIGH (ref 0.44–1.00)
GFR, Estimated: 24 mL/min — ABNORMAL LOW (ref >60)
Glucose, Bld: 111 mg/dL — ABNORMAL HIGH (ref 70–99)
Potassium: 4.1 mmol/L (ref 3.5–5.1)
Sodium: 141 mmol/L (ref 135–145)
Total Bilirubin: 0.9 mg/dL (ref 0.0–1.2)
Total Protein: 6.6 g/dL (ref 6.5–8.1)

## 2023-08-06 LAB — PROTIME-INR
INR: 1.4 — ABNORMAL HIGH (ref 0.8–1.2)
Prothrombin Time: 17.7 s — ABNORMAL HIGH (ref 11.4–15.2)

## 2023-08-06 LAB — MRSA NEXT GEN BY PCR, NASAL: MRSA by PCR Next Gen: NOT DETECTED

## 2023-08-06 LAB — BLOOD GAS, VENOUS
Acid-Base Excess: 0 mmol/L (ref 0.0–2.0)
Bicarbonate: 24.8 mmol/L (ref 20.0–28.0)
O2 Saturation: 64.1 %
Patient temperature: 37
pCO2, Ven: 40 mmHg — ABNORMAL LOW (ref 44–60)
pH, Ven: 7.4 (ref 7.25–7.43)
pO2, Ven: 36 mmHg (ref 32–45)

## 2023-08-06 LAB — RESP PANEL BY RT-PCR (RSV, FLU A&B, COVID)  RVPGX2
Influenza A by PCR: NEGATIVE
Influenza B by PCR: NEGATIVE
Resp Syncytial Virus by PCR: NEGATIVE
SARS Coronavirus 2 by RT PCR: NEGATIVE

## 2023-08-06 LAB — RESPIRATORY PANEL BY PCR

## 2023-08-06 LAB — STREP PNEUMONIAE URINARY ANTIGEN: Strep Pneumo Urinary Antigen: NEGATIVE

## 2023-08-06 LAB — I-STAT CG4 LACTIC ACID, ED: Lactic Acid, Venous: 1.3 mmol/L (ref 0.5–1.9)

## 2023-08-06 LAB — GLUCOSE, CAPILLARY: Glucose-Capillary: 101 mg/dL — ABNORMAL HIGH (ref 70–99)

## 2023-08-06 MED ORDER — ONDANSETRON HCL 4 MG/2ML IJ SOLN
4.0000 mg | Freq: Four times a day (QID) | INTRAMUSCULAR | Status: DC | PRN
  Administered 2023-08-10 – 2023-08-21 (×5): 4 mg via INTRAVENOUS
  Filled 2023-08-06 (×6): qty 2

## 2023-08-06 MED ORDER — HYDROCORTISONE SOD SUC (PF) 100 MG IJ SOLR
100.0000 mg | Freq: Once | INTRAMUSCULAR | Status: AC
  Administered 2023-08-06: 100 mg via INTRAVENOUS
  Filled 2023-08-06: qty 2

## 2023-08-06 MED ORDER — AZITHROMYCIN 250 MG PO TABS
500.0000 mg | ORAL_TABLET | Freq: Every day | ORAL | Status: AC
  Administered 2023-08-07 – 2023-08-09 (×3): 500 mg via ORAL
  Filled 2023-08-06 (×3): qty 2

## 2023-08-06 MED ORDER — PREDNISONE 1 MG PO TABS
2.0000 mg | ORAL_TABLET | Freq: Every day | ORAL | Status: DC
  Filled 2023-08-06: qty 2

## 2023-08-06 MED ORDER — INSULIN ASPART 100 UNIT/ML IJ SOLN
0.0000 [IU] | Freq: Three times a day (TID) | INTRAMUSCULAR | Status: DC
  Administered 2023-08-07 (×2): 1 [IU] via SUBCUTANEOUS
  Administered 2023-08-08 (×2): 2 [IU] via SUBCUTANEOUS
  Administered 2023-08-08: 3 [IU] via SUBCUTANEOUS
  Administered 2023-08-09 – 2023-08-10 (×2): 2 [IU] via SUBCUTANEOUS
  Administered 2023-08-10: 1 [IU] via SUBCUTANEOUS
  Administered 2023-08-13: 2 [IU] via SUBCUTANEOUS
  Administered 2023-08-14: 1 [IU] via SUBCUTANEOUS
  Administered 2023-08-15: 2 [IU] via SUBCUTANEOUS
  Administered 2023-08-16: 1 [IU] via SUBCUTANEOUS
  Administered 2023-08-16 (×2): 2 [IU] via SUBCUTANEOUS
  Administered 2023-08-17: 1 [IU] via SUBCUTANEOUS
  Administered 2023-08-17: 2 [IU] via SUBCUTANEOUS
  Administered 2023-08-17 – 2023-08-18 (×2): 1 [IU] via SUBCUTANEOUS
  Administered 2023-08-19: 2 [IU] via SUBCUTANEOUS
  Administered 2023-08-19: 1 [IU] via SUBCUTANEOUS
  Administered 2023-08-20: 3 [IU] via SUBCUTANEOUS
  Administered 2023-08-20: 2 [IU] via SUBCUTANEOUS
  Administered 2023-08-20: 1 [IU] via SUBCUTANEOUS
  Administered 2023-08-21: 3 [IU] via SUBCUTANEOUS
  Administered 2023-08-22: 2 [IU] via SUBCUTANEOUS

## 2023-08-06 MED ORDER — SODIUM CHLORIDE 0.9 % IV SOLN: INTRAVENOUS | Status: AC

## 2023-08-06 MED ORDER — LACTATED RINGERS IV BOLUS (SEPSIS)
500.0000 mL | Freq: Once | INTRAVENOUS | Status: AC
  Administered 2023-08-06: 500 mL via INTRAVENOUS

## 2023-08-06 MED ORDER — SODIUM CHLORIDE 0.9 % IV SOLN
1.0000 g | INTRAVENOUS | Status: DC
  Administered 2023-08-07: 1 g via INTRAVENOUS
  Filled 2023-08-06: qty 10

## 2023-08-06 MED ORDER — IPRATROPIUM-ALBUTEROL 0.5-2.5 (3) MG/3ML IN SOLN: 3.0000 mL | Freq: Four times a day (QID) | RESPIRATORY_TRACT | Status: DC

## 2023-08-06 MED ORDER — SODIUM CHLORIDE 0.9 % IV SOLN
1.0000 g | Freq: Once | INTRAVENOUS | Status: AC
  Administered 2023-08-06: 1 g via INTRAVENOUS
  Filled 2023-08-06: qty 10

## 2023-08-06 MED ORDER — PREDNISONE 5 MG PO TABS: 5.0000 mg | ORAL_TABLET | Freq: Every day | ORAL | Status: DC

## 2023-08-06 MED ORDER — INSULIN ASPART 100 UNIT/ML IJ SOLN
0.0000 [IU] | Freq: Every day | INTRAMUSCULAR | Status: DC
  Administered 2023-08-07: 2 [IU] via SUBCUTANEOUS

## 2023-08-06 MED ORDER — IPRATROPIUM-ALBUTEROL 0.5-2.5 (3) MG/3ML IN SOLN
3.0000 mL | Freq: Four times a day (QID) | RESPIRATORY_TRACT | Status: DC | PRN
  Administered 2023-08-12: 3 mL via RESPIRATORY_TRACT
  Filled 2023-08-06: qty 3

## 2023-08-06 MED ORDER — ACETAMINOPHEN 325 MG PO TABS
650.0000 mg | ORAL_TABLET | Freq: Four times a day (QID) | ORAL | Status: DC | PRN
  Administered 2023-08-06 – 2023-08-22 (×6): 650 mg via ORAL
  Filled 2023-08-06 (×6): qty 2

## 2023-08-06 MED ORDER — ONDANSETRON HCL 4 MG PO TABS: 4.0000 mg | ORAL_TABLET | Freq: Four times a day (QID) | ORAL | Status: DC | PRN

## 2023-08-06 MED ORDER — HYDROCORTISONE SOD SUC (PF) 100 MG IJ SOLR
50.0000 mg | Freq: Four times a day (QID) | INTRAMUSCULAR | Status: DC
  Administered 2023-08-07 – 2023-08-08 (×5): 50 mg via INTRAVENOUS
  Filled 2023-08-06 (×5): qty 2

## 2023-08-06 MED ORDER — ACETAMINOPHEN 650 MG RE SUPP: 650.0000 mg | Freq: Four times a day (QID) | RECTAL | Status: DC | PRN

## 2023-08-06 MED ORDER — AZITHROMYCIN 250 MG PO TABS
500.0000 mg | ORAL_TABLET | Freq: Once | ORAL | Status: AC
  Administered 2023-08-06: 500 mg via ORAL
  Filled 2023-08-06: qty 2

## 2023-08-06 MED ORDER — SENNOSIDES-DOCUSATE SODIUM 8.6-50 MG PO TABS: 1.0000 | ORAL_TABLET | Freq: Every evening | ORAL | Status: DC | PRN

## 2023-08-06 MED ORDER — RIVAROXABAN 20 MG PO TABS
20.0000 mg | ORAL_TABLET | Freq: Every day | ORAL | Status: DC
  Administered 2023-08-07 – 2023-08-12 (×6): 20 mg via ORAL
  Filled 2023-08-06 (×6): qty 1

## 2023-08-06 MED ORDER — BUDESON-GLYCOPYRROL-FORMOTEROL 160-9-4.8 MCG/ACT IN AERO
2.0000 | INHALATION_SPRAY | Freq: Two times a day (BID) | RESPIRATORY_TRACT | Status: DC
  Administered 2023-08-07 – 2023-08-22 (×27): 2 via RESPIRATORY_TRACT
  Filled 2023-08-06: qty 5.9

## 2023-08-06 NOTE — ED Notes (Signed)
 Attempted straight stick for blood cultures 2 times and unsuccessful both times.

## 2023-08-06 NOTE — ED Notes (Signed)
 103.1 fever RN Nellie Banas aware

## 2023-08-06 NOTE — ED Provider Notes (Signed)
 [facility] Provider Note   CSN: [id number] Arrival date & time: 08/06/23  1542     Patient presents with: weakness  Ebony Scott is a 73 y.o. female.   HPI   Patient has history of multiple medical problems including CHF chronic kidney disease diabetes rheumatoid arthritis ischemic cardiomyopathy coronary artery disease hypogammaglobulinemia allergies irritable bowel syndrome breast cancer pseudogout asthma fibromyalgia gastric antral vascular ectasia, sepsis, pneumonia.  Patient presents to the ED for evaluation of a fever.  Patient states she started having the symptoms today however family states she had a fever up to 101 with weakness.  She also complains of a bit of a cough.  Patient states she does not feel like it is any different than usual although EMS reported that daughter indicated it had been productive and ongoing for a week.  The daughter checked on the patient today and thought she did not look well so she called EMS.  Patient denies any trouble with headache or sore throat.  She denies any abdominal pain.  No vomiting or diarrhea.  No chest pain.  No dysuria.  Prior to Admission medications   Medication Sig Start Date End Date Taking? Authorizing Provider  acetaminophen  (TYLENOL ) 500 MG tablet Take 1,000 mg by mouth as needed for moderate pain.    [provider]  albuterol  (PROVENTIL ) (2.5 MG/3ML) 0.083% nebulizer solution Take 3 mLs (2.5 mg total) by nebulization every 4 (four) hours as needed for wheezing or shortness of breath. 01/01/23   Kozlow, Rema Care, MD  albuterol  (VENTOLIN  HFA) 108 (90 Base) MCG/ACT inhaler Inhale 2 puffs into the lungs every 6 (six) hours as needed for wheezing or shortness of breath. 01/01/23   Kozlow, Rema Care, MD  allopurinol  (ZYLOPRIM ) 100 MG tablet Take 1 tablet (100 mg total) by mouth daily. 03/04/23   Austine Lefort, MD  Alpha-D-Galactosidase (BEANO PO) Take 2-3 tablets by mouth 3 (three)  times daily as needed (Gas).    [provider]  Alpha-Lipoic Acid 600 MG TABS Take 600 mg by mouth in the morning.    [provider]  atorvastatin  (LIPITOR) 80 MG tablet Take 1 tablet (80 mg total) by mouth every evening. 03/04/23   Austine Lefort, MD  Azelastine  HCl 137 MCG/SPRAY SOLN 2 sprays once per day 08/02/23   Amadeo June, MD  Calcium -Magnesium -Vitamin D  (CITRACAL CALCIUM +D) 600-40-500 MG-MG-UNIT TB24 Take 1 tablet by mouth daily.    [provider]  cetirizine  (ZYRTEC ) 10 MG tablet Take 1 tablet (10 mg total) by mouth daily as needed for allergies (Can take a n extra dose during flare ups.). 04/16/23   Kozlow, Rema Care, MD  Cholecalciferol  (VITAMIN D3) 25 MCG (1000 UT) capsule Take 1,000 Units by mouth at bedtime.    [provider]  clopidogrel  (PLAVIX ) 75 MG tablet TAKE 1 TABLET BY MOUTH EVERY DAY 07/19/23   Sheryle Donning, MD  denosumab  (PROLIA ) 60 MG/ML SOSY injection Inject 60 mg into the skin every 6 (six) months.    [provider]  dexlansoprazole  (DEXILANT ) 60 MG capsule Take 1 capsule (60 mg total) by mouth in the morning. 04/16/23   Kozlow, Rema Care, MD  diclofenac  Sodium (VOLTAREN ) 1 % GEL Apply 1 Application topically as needed (pain).    [provider]  empagliflozin  (JARDIANCE ) 10 MG TABS tablet Take 1 tablet (10 mg total) by mouth daily. 07/05/23   Austine Lefort, MD  ENTRESTO  24-26 MG TAKE  1 TABLET BY MOUTH TWICE A DAY 05/07/23   Sheryle Donning, MD  EPINEPHRINE  0.3 mg/0.3 mL IJ SOAJ injection USE AS DIRECTED BY YOUR PHYSICIAN INTRAMUSCULARLY AS NEEDED FOR ANAPHYLAXIS 10/09/22   Kozlow, Rema Care, MD  famotidine  (PEPCID ) 20 MG tablet TAKE 1 TABLET (20 MG) BY MOUTH DAILY BEFORE LUNCH AND1 TABLET BY MOUTH EACH NIGHT AT BEDTIME 05/07/23   Kozlow, Rema Care, MD  fluticasone  (FLONASE ) 50 MCG/ACT nasal spray Place 2 sprays into both nostrils daily. 08/02/23   Amadeo June, MD  Fluticasone -Umeclidin-Vilant (TRELEGY  ELLIPTA) 200-62.5-25 MCG/ACT AEPB INHALE 1 INHALATION INTO THE LUNGS IN THE MORNING. 04/16/23   Kozlow, Rema Care, MD  folic acid  (FOLVITE ) 1 MG tablet Take 1 tablet (1 mg total) by mouth daily. 03/04/23   Austine Lefort, MD  furosemide  (LASIX ) 40 MG tablet Take 40 mg by mouth 2 (two) times a week. Monday and Thursday 01/18/23   [provider]  gabapentin  (NEURONTIN ) 300 MG capsule Take 1 capsule (300 mg total) by mouth at bedtime. 05/04/22   Faith Homes, MD  guaiFENesin  (MUCINEX ) 600 MG 12 hr tablet Take 600 mg by mouth 2 (two) times daily.    [provider]  HIZENTRA  10 GM/50ML SOLN Inject 15 g into the skin once a week. 13 g Infusion 01/14/23   Kozlow, Eric J, MD  Immune Globulin , Human, (HIZENTRA ) 1 GM/5ML SOLN Inject 1 g into the skin.    [provider]  Immune Globulin , Human, (HIZENTRA ) 10 GM/50ML SOSY INFUSE 13 GRAMS SUBCUTANEOUSLY EVERY SEVEN DAYS 05/10/23   Kozlow, Rema Care, MD  ipratropium-albuterol  (DUONEB) 0.5-2.5 (3) MG/3ML SOLN Take 3 mLs by nebulization every 6 (six) hours as needed. 07/22/23   Wilfredo Hanly, MD  isosorbide -hydrALAZINE  (BIDIL ) 20-37.5 MG tablet TAKE 1 TABLET BY MOUTH TWICE A DAY 05/07/23   Sheryle Donning, MD  Lactase (LACTOSE FAST ACTING RELIEF PO) Take 2-3 tablets by mouth 3 (three) times daily before meals. For dairy food or drink    [provider]  magnesium  oxide (MAG-OX) 400 (240 Mg) MG tablet Take 400 mg by mouth at bedtime.    [provider]  montelukast  (SINGULAIR ) 10 MG tablet Take 1 tablet (10 mg total) by mouth daily after supper. 03/04/23   Austine Lefort, MD  mupirocin  ointment (BACTROBAN ) 2 % Apply 1 Application topically as needed (wounds). 01/29/23   Kozlow, Rema Care, MD  nystatin  (MYCOSTATIN ) 100000 UNIT/ML suspension Swish, gargle, and swallow 5 ml after Trelegy use 05/24/23   Etter Hermann., MD  potassium chloride  (KLOR-CON ) 10 MEQ tablet Take 10 mEq by mouth See admin instructions.  Mondays and Thursdays. 01/18/23   [provider]  predniSONE  (DELTASONE ) 1 MG tablet Take 2 mg by mouth daily with breakfast. Takes along with 5 mg to total 7mg     [provider]  predniSONE  (DELTASONE ) 5 MG tablet Take 1 tablet (5 mg total) by mouth daily with breakfast. Patient taking differently: Take 5 mg by mouth daily with breakfast. Takes along with 1 mg to total 7 mg 05/13/23   Matt Song, MD  Prenatal Vit-Fe Fumarate-FA (PRENATAL VITAMINS PO) Take 1 tablet by mouth in the morning.    [provider]  Probiotic Product (ALIGN) 4 MG CAPS Take 4 mg by mouth every evening.    [provider]  Respiratory Therapy Supplies (NEBULIZER MASK ADULT) MISC 1 Device by Does not apply route as directed. 01/01/23   Kozlow, Rema Care, MD  rivaroxaban  (  XARELTO ) 20 MG TABS tablet Take 1 tablet (20 mg total) by mouth daily with supper. 03/04/23   Austine Lefort, MD  traMADol  (ULTRAM ) 50 MG tablet Take 1 tablet (50 mg total) by mouth every 6 (six) hours as needed. 07/05/23   Austine Lefort, MD  traZODone  (DESYREL ) 50 MG tablet TAKE 2 TABLETS BY MOUTH AT BEDTIME. 07/04/23   Austine Lefort, MD    Allergies: Baclofen , Other, Pork-derived products, Shellfish allergy , Shrimp extract, Tetracycline hcl, Xolair  [omalizumab ], Zoledronic  acid, Celecoxib, Dilaudid  [hydromorphone  hcl], Hydrocodone -acetaminophen , Levofloxacin , Molds & smuts, Morphine  and codeine, Oxycodone  hcl, Paroxetine, Penicillins, Tetracycline hcl, Oxycodone -acetaminophen , Diltiazem  hcl, Dust mite extract, Gamunex [immune globulin ], Penicillin g procaine, Rituximab , Rofecoxib, and Tree extract    Review of Systems  Updated Vital Signs BP 118/79   Pulse 69   Temp 99 F (37.2 C) (Oral)   Resp 19   SpO2 100%   Physical Exam Vitals and nursing note reviewed.  Constitutional:      General: She is awake. She is not in acute distress.    Appearance: She is well-developed. She is not diaphoretic.      Comments: Listless  HENT:     Head: Normocephalic and atraumatic.     Right Ear: External ear normal.     Left Ear: External ear normal.   Eyes:     General: No scleral icterus.       Right eye: No discharge.        Left eye: No discharge.     Conjunctiva/sclera: Conjunctivae normal.   Neck:     Trachea: No tracheal deviation.   Cardiovascular:     Rate and Rhythm: Normal rate and regular rhythm.  Pulmonary:     Effort: Pulmonary effort is normal. No respiratory distress.     Breath sounds: Normal breath sounds. No stridor. No wheezing or rales.  Abdominal:     General: Bowel sounds are normal. There is no distension.     Palpations: Abdomen is soft.     Tenderness: There is no abdominal tenderness. There is no guarding or rebound.   Musculoskeletal:        General: No tenderness or deformity.     Cervical back: Neck supple.   Skin:    General: Skin is warm and dry.     Findings: No rash.   Neurological:     General: No focal deficit present.     Cranial Nerves: No cranial nerve deficit, dysarthria or facial asymmetry.     Sensory: No sensory deficit.     Motor: No abnormal muscle tone or seizure activity.     Coordination: Coordination normal.   Psychiatric:        Mood and Affect: Mood normal.        Behavior: Behavior is cooperative.     (all labs ordered are listed, but only abnormal results are displayed) Labs Reviewed  COMPREHENSIVE METABOLIC PANEL WITH GFR - Abnormal; Notable for the following components:      Result Value   Glucose, Bld 111 (*)    BUN 37 (*)    Creatinine, Ser 2.14 (*)    Albumin 2.9 (*)    GFR, Estimated 24 (*)    All other components within normal limits  CBC WITH DIFFERENTIAL/PLATELET - Abnormal; Notable for the following components:   RBC 2.91 (*)    Hemoglobin 9.7 (*)    HCT 31.5 (*)    MCV 108.2 (*)    RDW 15.8 (*)  Platelets 140 (*)    Lymphs Abs 0.5 (*)    All other components within normal limits  PROTIME-INR -  Abnormal; Notable for the following components:   Prothrombin Time 17.7 (*)    INR 1.4 (*)    All other components within normal limits  URINALYSIS, W/ REFLEX TO CULTURE (INFECTION SUSPECTED) - Abnormal; Notable for the following components:   Glucose, UA >=500 (*)    All other components within normal limits  BLOOD GAS, VENOUS - Abnormal; Notable for the following components:   pCO2, Ven 40 (*)    All other components within normal limits  RESP PANEL BY RT-PCR (RSV, FLU A&B, COVID)  RVPGX2  CULTURE, BLOOD (ROUTINE X 2)  CULTURE, BLOOD (ROUTINE X 2)  I-STAT CG4 LACTIC ACID , ED    EKG: EKG Interpretation Date/Time:  Tuesday August 06 2023 15:49:28 EDT Ventricular Rate:  79 PR Interval:  150 QRS Duration:  91 QT Interval:  445 QTC Calculation: 511 R Axis:   50  Text Interpretation: Sinus rhythm Anteroseptal infarct, old Nonspecific T abnormalities, lateral leads Prolonged QT interval , increased since last tracing Confirmed by Trish Furl [phone]) on 08/06/2023 3:52:12 PM  Radiology: [facility] Query Chest Port 1 View Result Date: 08/06/2023 CLINICAL DATA:  Sepsis. Fatigue and weakness. Altered mental status. Fever. Productive cough. EXAM: PORTABLE CHEST 1 VIEW COMPARISON:  05/18/2023 FINDINGS: AICD noted. Wrist right shoulder arthroplasty. Postoperative findings in the cervical spine. Atherosclerotic calcification of the aortic arch. Heart size within normal limits. 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 Reticulonodular opacity laterally in the right upper lobe, possibly from atelectasis or atypical infectious bronchiolitis. Lateral calcifications in the left breast. No blunting of the costophrenic angles. IMPRESSION: 1. Reticulonodular opacity laterally in the right upper lobe, possibly from atelectasis or atypical infectious bronchiolitis. 2. AICD. 3. 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 4. Lateral calcifications in the left breast. Electronically Signed   By: Freida Jes M.D.   On: 08/06/2023 17:00     Procedures   Medications Ordered in the ED  cefTRIAXone  (ROCEPHIN ) 1 g in sodium chloride  0.9 % 100 mL IVPB (has no administration in time range)  azithromycin  (ZITHROMAX ) tablet 500 mg (has no administration in time range)  lactated ringers  bolus 500 mL (has no administration in time range)  lactated ringers  bolus 500 mL (0 mLs Intravenous Stopped 08/06/23 1823)    Clinical Course as of 08/07/23 0955  Tue Aug 06, 2023  1733 CBC with Differential(!) No leukocytosis.  Hemoglobin stable. [JK]  1733 I-Stat Lactic Acid , ED Lactic acid  level normal.  Urinalysis negative. [JK]  1733 Chest x-ray shows opacity in the right upper lobe possible a typical infectious bronchiolitis [JK]  1834 Comprehensive metabolic panel(!) Creatinine increased compared to previous value [JK]  1835 Resp panel by RT-PCR (RSV, Flu A&B, Covid) Anterior Nasal Swab COVID flu RSV negative [JK]  1835 Urinalysis, w/ Reflex to Culture (Infection Suspected) -Urine, Catheterized(!) Urinalysis negative [JK]  1903 Case discussed with Dr Yvonne Hering [JK]    Clinical Course User Index [JK] Trish Furl, MD                                 Medical Decision Making Problems Addressed: AKI (acute kidney injury) [facility]): acute illness or injury that poses a threat to life or bodily functions Community acquired pneumonia, unspecified laterality: acute illness or injury that poses a threat to life or bodily functions  Amount and/or Complexity of Data Reviewed Labs: ordered. Decision-making details documented in ED Course. Radiology: ordered and independent interpretation performed.  Risk Prescription drug management. Decision regarding hospitalization.   Patient presented to ED for evaluation of fever weakness cough.  Patient has history of multiple medical problems.  She started having fever up to 101 this weekend.  Patient does not have any leukocytosis or lactic acidosis however  x-ray does suggest possible bronchiolitis.  Pneumonia is a concern as well.  Patient's laboratory tests do show elevation in her creatinine consistent with an AKI.  Will plan on IV hydration.  I will start IV antibiotics.  I will consult the medical service for admission.     Final diagnoses:  Community acquired pneumonia, unspecified laterality  AKI (acute kidney injury) [facility])    ED Discharge Orders     None          Trish Furl, MD 08/07/23 [phone]

## 2023-08-06 NOTE — ED Triage Notes (Signed)
 Pt presents to the ED via EMS from home with complaints of fatigue, weakness, altered mental status and fever. Patient daughter states she came to check on her and noticed that she did not look good. Pt also has a productive cough that's been ongoing for a week.   BP 110/44 HR 73 R 24 O2  CBG 118

## 2023-08-06 NOTE — H&P (Addendum)
 History and Physical  Ebony Scott:[id number] DOB: 12-Mar-1950 DOA: 08/06/2023  PCP: Austine Lefort, MD   Chief Complaint: Generalized weakness, fatigue, fever, cough  HPI: Ebony Scott is a 73 y.o. 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 Patient reports that she feels terrible. She endorses a cough that has been persistent and recently productive of yellow sputum. She also endorses poor appetite, fever and generalized malaise. She denies any shortness of breath, nausea, vomiting, abdominal pain, dysuria or chest pain.  ED Course: Initial vitals show low-grade fever of 99 but otherwise normotensive and SpO2 100% on room air. Initial labs significant for BUN/creatinine 37/2.14, albumin 2.9, WBC 9.0, Hgb 9.7, platelet 140, lactic acid  1.3, PT/INR 17.7/1.4, negative COVID, RSV and flu test, UA with no signs of infection and unremarkable VBG. EKG shows sinus rhythm with prolonged QTc of 511. CXR shows reticulonodular opacity laterally in the right upper lobe, possibly from atelectasis or atypical infectious bronchiolitis. Pt received IV LR 500 cc bolus, IV Rocephin  and oral azithromycin . [facility] was consulted for admission.   Review of Systems: Please see HPI for pertinent positives and negatives. A complete 10 system review of systems are otherwise negative.  Past Medical History:  Diagnosis Date   Anemia    Angio-edema    Breast cancer (HCC) 1998   Left breast, in remission   Bronchiectasis (HCC)    CHF (congestive heart failure)  (HCC)    Closed displaced fracture of metatarsal bone of right foot 01/20/2019   Closed fracture of fifth metatarsal bone of right foot, initial encounter 01/15/2018   Clostridium difficile colitis 01/14/2018   Coronary artery disease    Depression    some; don't take anything for it (02/20/2012), pt denies dx as of 06/02/21   Diverticulosis    DVT (deep venous thrombosis) (HCC)    Fibromyalgia 11/2011   Fracture of base of fifth metatarsal bone with routine healing, left    GAVE (gastric antral vascular ectasia)    GERD (gastroesophageal reflux disease)    Headache(784.0)    related to allergies; more at different times during the year (02/20/2012)   Hemorrhoids    Hiatal hernia    back and neck   History of blood transfusion 2023   1 unit, 5 units of Iron    Hx of adenomatous colonic polyps 04/12/2016   Hypercholesteremia    good cholesterol is high   Hypothyroidism    h/o Graves disease   IBS (irritable bowel syndrome)    Moderate persistent asthma    -FeV1 72% 2011, -IgE 102 2011, CT sinus Neg 2011   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 Larrie Po   Sleep apnea    uses cpap nightly   Tracheobronchomalacia    Vasculitis (HCC) 12/13/2017   Past Surgical History:  Procedure Laterality Date   ABDOMINAL HYSTERECTOMY N/A  Phreesia 10/21/2019   ANTERIOR AND POSTERIOR REPAIR  1990's   APPENDECTOMY     ARGON LASER APPLICATION  02/05/2021   Procedure: ARGON LASER APPLICATION;  Surgeon: Ace Holder, MD;  Location: [facility];  Service: Gastroenterology;;   BREAST LUMPECTOMY  1998   left   BREAST SURGERY N/A    Phreesia 10/21/2019   BRONCHIAL WASHINGS  04/05/2020   Procedure: BRONCHIAL WASHINGS;  Surgeon: Wilfredo Hanly, MD;  Location: [facility];  Service: Pulmonary;;   CAPSULOTOMY Right 08/28/2021   Procedure: CAPSULOTOMY;  Surgeon: Velma Ghazi, DPM;  Location: [facility];  Service: Podiatry;  Laterality: Right;   CARPOMETACARPEL (CMC) FUSION OF THUMB WITH AUTOGRAFT FROM RADIUS  ~ 2009   both thumbs (02/20/2012)   CATARACT EXTRACTION W/ INTRAOCULAR LENS  IMPLANT, BILATERAL  2012   CERVICAL DISCECTOMY  10/2001   C5-C6   CERVICAL FUSION  2003   C3-C4   CHOLECYSTECTOMY     COLONOSCOPY     CORONARY STENT INTERVENTION N/A 01/11/2021   Procedure: CORONARY STENT INTERVENTION;  Surgeon: Arleen Lacer, MD;  Location: [facility] INVASIVE CV LAB;  Service: Cardiovascular;  Laterality: N/A;   DEBRIDEMENT TENNIS ELBOW  ?1970's   right   ESOPHAGOGASTRODUODENOSCOPY     ESOPHAGOGASTRODUODENOSCOPY (EGD) WITH PROPOFOL  N/A 02/05/2021   Procedure: ESOPHAGOGASTRODUODENOSCOPY (EGD) WITH PROPOFOL ;  Surgeon: Ace Holder, MD;  Location: [facility];  Service: Gastroenterology;  Laterality: N/A;   EYE SURGERY N/A    Phreesia 10/21/2019   HAMMER TOE SURGERY Right 08/28/2021   Procedure: HAMMER TOE REPAIR 2ND TOE RIGHT FOOT;  Surgeon: Velma Ghazi, DPM;  Location: [facility];  Service: Podiatry;  Laterality: Right;   HYSTERECTOMY     I & D EXTREMITY Right 09/22/2020   Procedure: IRRIGATION AND DEBRIDEMENT RIGHT HAND AND ELBOW;  Surgeon: Osa Blase, MD;  Location: [facility];  Service: Orthopedics;  Laterality: Right;   ICD IMPLANT N/A 08/27/2022   Procedure: ICD IMPLANT;  Surgeon: Efraim Grange, MD;  Location: [facility];  Service: Cardiovascular;  Laterality: N/A;   KNEE ARTHROPLASTY  ?1610'R   ?right; w/cartilage repair (02/20/2012)   MASS EXCISION Left 08/28/2021   Procedure: EXCISION OF SOFT TISSUE  MASS LEFT FOOT;  Surgeon: Velma Ghazi, DPM;  Location: [facility];  Service: Podiatry;  Laterality: Left;   NASAL SEPTUM SURGERY  1980's   POSTERIOR CERVICAL FUSION/FORAMINOTOMY  2004   failed initial fusion; rewired  anterior neck  (02/20/2012)   REVERSE SHOULDER ARTHROPLASTY Right 02/16/2020   Procedure: REVERSE SHOULDER ARTHROPLASTY;  Surgeon: Osa Blase, MD;  Location: [facility];  Service: Orthopedics;  Laterality: Right;   RIGHT/LEFT HEART CATH AND CORONARY ANGIOGRAPHY N/A 01/11/2021   Procedure: RIGHT/LEFT HEART CATH AND CORONARY ANGIOGRAPHY;  Surgeon: Arleen Lacer, MD;  Location: [facility] INVASIVE CV LAB;  Service: Cardiovascular;  Laterality: N/A;   SPINE SURGERY N/A    Phreesia 10/21/2019   TONSILLECTOMY  ~ 1953   VESICOVAGINAL FISTULA CLOSURE W/ TAH  1988   VIDEO BRONCHOSCOPY Bilateral 08/23/2016   Procedure: VIDEO BRONCHOSCOPY WITH FLUORO;  Surgeon: Samual Crochet, MD;  Location: [facility];  Service: Cardiopulmonary;  Laterality: Bilateral;   VIDEO BRONCHOSCOPY N/A 04/05/2020   Procedure: VIDEO BRONCHOSCOPY WITHOUT FLUORO;  Surgeon: Wilfredo Hanly, MD;  Location: [facility];  Service: Pulmonary;  Laterality: N/A;   Social History:  reports that she has never smoked. She has been exposed to tobacco smoke. She has never used smokeless tobacco. She reports  that she does not currently use alcohol . She reports that she does not use drugs.  Allergies  Allergen Reactions   Baclofen  Anaphylaxis and Other (See Comments)    Altered mental status requiring a 3-day hospital stay- patient became unresponsive   Put me in a coma for five days, Altered mental status requiring a 3-day hospital stay- patient became unresponsive   Other Shortness Of Breath and Other (See Comments)    Grass and weeds sneezing; filled sinuses (02/20/2012)   Pork-Derived Products Anaphylaxis   Shellfish Allergy  Anaphylaxis, Shortness Of Breath, Itching and Rash   Shrimp Extract Anaphylaxis and Other (See Comments)    ALL SHELLFISH   Tetracycline Hcl Nausea And Vomiting   Xolair  [Omalizumab ] Other (See Comments)    Caused Blood clot   Zoledronic  Acid Other (See Comments)    Reclast - Fever, Put in hospital, dr said it was a  reaction from a reaction    Fever, inability to walk, Put in hospital  Fever, Put in hospital, dr said it was a reaction from a reaction , Other reaction(s): Unknown   Celecoxib Swelling    Feet swelling   Dilaudid  [Hydromorphone  Hcl] Itching   Hydrocodone -Acetaminophen  Itching   Levofloxacin  Other (See Comments)    GI upset   Molds & Smuts Cough   Morphine  And Codeine Hives and Itching   Oxycodone  Hcl Nausea And Vomiting    GI Intolerance   Paroxetine Nausea And Vomiting and Other (See Comments)    GI Intolerance also   Penicillins Rash and Other (See Comments)    welts (02/20/2012)  Tolerated Ancef  02/16/20   Tetracycline Hcl Other (See Comments)    GI Intolerance   Oxycodone -Acetaminophen  Itching   Diltiazem  Hcl Swelling   Dust Mite Extract Other (See Comments) and Cough    sneezing (02/20/2012) too   Gamunex [Immune Globulin ] Itching and Rash    Tolerates hizentra     Penicillin G Procaine Rash and Other (See Comments)    Welts, also   Rituximab  Other (See Comments)    Anemia    Rofecoxib Swelling    Vioxx- feet swelling   Tree Extract Itching    tested and told I was allergic to it; never experienced a reaction to it (02/20/2012)    Family History  Problem Relation Age of Onset   Allergies Mother    Heart disease Mother    Arthritis Mother    Lung cancer Mother        heavy smoker   Diabetes Mother    Allergies Father    Heart disease Father    Arthritis Father    Stroke Father    Heart disease Brother    Diabetes Maternal Grandfather    Colitis Daughter    Colon cancer Other        Maternal half uncle     Prior to Admission medications   Medication Sig Start Date End Date Taking? Authorizing Provider  acetaminophen  (TYLENOL ) 500 MG tablet Take 1,000 mg by mouth as needed for moderate pain.    [provider]  albuterol  (PROVENTIL ) (2.5 MG/3ML) 0.083% nebulizer solution Take 3 mLs (2.5 mg total) by nebulization every 4 (four) hours as needed  for wheezing or shortness of breath. 01/01/23   Kozlow, Rema Care, MD  albuterol  (VENTOLIN  HFA) 108 (90 Base) MCG/ACT inhaler Inhale 2 puffs into the lungs every 6 (six) hours as needed for wheezing or shortness of breath. 01/01/23   Kozlow, Rema Care, MD  allopurinol  (ZYLOPRIM ) 100 MG  tablet Take 1 tablet (100 mg total) by mouth daily. 03/04/23   Austine Lefort, MD  Alpha-D-Galactosidase (BEANO PO) Take 2-3 tablets by mouth 3 (three) times daily as needed (Gas).    [provider]  Alpha-Lipoic Acid 600 MG TABS Take 600 mg by mouth in the morning.    [provider]  atorvastatin  (LIPITOR) 80 MG tablet Take 1 tablet (80 mg total) by mouth every evening. 03/04/23   Austine Lefort, MD  Azelastine  HCl 137 MCG/SPRAY SOLN 2 sprays once per day 08/02/23   Amadeo June, MD  Calcium -Magnesium -Vitamin D  (CITRACAL CALCIUM +D) 600-40-500 MG-MG-UNIT TB24 Take 1 tablet by mouth daily.    [provider]  cetirizine  (ZYRTEC ) 10 MG tablet Take 1 tablet (10 mg total) by mouth daily as needed for allergies (Can take a n extra dose during flare ups.). 04/16/23   Kozlow, Rema Care, MD  Cholecalciferol  (VITAMIN D3) 25 MCG (1000 UT) capsule Take 1,000 Units by mouth at bedtime.    [provider]  clopidogrel  (PLAVIX ) 75 MG tablet TAKE 1 TABLET BY MOUTH EVERY DAY 07/19/23   Sheryle Donning, MD  denosumab  (PROLIA ) 60 MG/ML SOSY injection Inject 60 mg into the skin every 6 (six) months.    [provider]  dexlansoprazole  (DEXILANT ) 60 MG capsule Take 1 capsule (60 mg total) by mouth in the morning. 04/16/23   Kozlow, Rema Care, MD  diclofenac  Sodium (VOLTAREN ) 1 % GEL Apply 1 Application topically as needed (pain).    [provider]  empagliflozin  (JARDIANCE ) 10 MG TABS tablet Take 1 tablet (10 mg total) by mouth daily. 07/05/23   Austine Lefort, MD  ENTRESTO  24-26 MG TAKE 1 TABLET BY MOUTH TWICE A DAY 05/07/23   Sheryle Donning, MD  EPINEPHRINE  0.3 mg/0.3 mL IJ  SOAJ injection USE AS DIRECTED BY YOUR PHYSICIAN INTRAMUSCULARLY AS NEEDED FOR ANAPHYLAXIS 10/09/22   Kozlow, Rema Care, MD  famotidine  (PEPCID ) 20 MG tablet TAKE 1 TABLET (20 MG) BY MOUTH DAILY BEFORE LUNCH AND1 TABLET BY MOUTH EACH NIGHT AT BEDTIME 05/07/23   Kozlow, Rema Care, MD  fluticasone  (FLONASE ) 50 MCG/ACT nasal spray Place 2 sprays into both nostrils daily. 08/02/23   Amadeo June, MD  Fluticasone -Umeclidin-Vilant (TRELEGY ELLIPTA ) 200-62.5-25 MCG/ACT AEPB INHALE 1 INHALATION INTO THE LUNGS IN THE MORNING. 04/16/23   Kozlow, Rema Care, MD  folic acid  (FOLVITE ) 1 MG tablet Take 1 tablet (1 mg total) by mouth daily. 03/04/23   Austine Lefort, MD  furosemide  (LASIX ) 40 MG tablet Take 40 mg by mouth 2 (two) times a week. Monday and Thursday 01/18/23   [provider]  gabapentin  (NEURONTIN ) 300 MG capsule Take 1 capsule (300 mg total) by mouth at bedtime. 05/04/22   Faith Homes, MD  guaiFENesin  (MUCINEX ) 600 MG 12 hr tablet Take 600 mg by mouth 2 (two) times daily.    [provider]  HIZENTRA  10 GM/50ML SOLN Inject 15 g into the skin once a week. 13 g Infusion 01/14/23   Kozlow, Rema Care, MD  Immune Globulin , Human, (HIZENTRA ) 1 GM/5ML SOLN Inject 1 g into the skin.    [provider]  Immune Globulin , Human, (HIZENTRA ) 10 GM/50ML SOSY INFUSE 13 GRAMS SUBCUTANEOUSLY EVERY SEVEN DAYS 05/10/23   Kozlow, Rema Care, MD  ipratropium-albuterol  (DUONEB) 0.5-2.5 (3) MG/3ML SOLN Take 3 mLs by nebulization every 6 (six) hours as needed. 07/22/23   Wilfredo Hanly, MD  isosorbide -hydrALAZINE  (BIDIL ) 20-37.5 MG tablet TAKE 1 TABLET BY MOUTH TWICE  A DAY 05/07/23   Sheryle Donning, MD  Lactase (LACTOSE FAST ACTING RELIEF PO) Take 2-3 tablets by mouth 3 (three) times daily before meals. For dairy food or drink    [provider]  magnesium  oxide (MAG-OX) 400 (240 Mg) MG tablet Take 400 mg by mouth at bedtime.    [provider]  montelukast  (SINGULAIR ) 10 MG tablet  Take 1 tablet (10 mg total) by mouth daily after supper. 03/04/23   Austine Lefort, MD  mupirocin  ointment (BACTROBAN ) 2 % Apply 1 Application topically as needed (wounds). 01/29/23   Kozlow, Rema Care, MD  nystatin  (MYCOSTATIN ) 100000 UNIT/ML suspension Swish, gargle, and swallow 5 ml after Trelegy use 05/24/23   Etter Hermann., MD  potassium chloride  (KLOR-CON ) 10 MEQ tablet Take 10 mEq by mouth See admin instructions. Mondays and Thursdays. 01/18/23   [provider]  predniSONE  (DELTASONE ) 1 MG tablet Take 2 mg by mouth daily with breakfast. Takes along with 5 mg to total 7mg     [provider]  predniSONE  (DELTASONE ) 5 MG tablet Take 1 tablet (5 mg total) by mouth daily with breakfast. Patient taking differently: Take 5 mg by mouth daily with breakfast. Takes along with 1 mg to total 7 mg 05/13/23   Matt Song, MD  Prenatal Vit-Fe Fumarate-FA (PRENATAL VITAMINS PO) Take 1 tablet by mouth in the morning.    [provider]  Probiotic Product (ALIGN) 4 MG CAPS Take 4 mg by mouth every evening.    [provider]  Respiratory Therapy Supplies (NEBULIZER MASK ADULT) MISC 1 Device by Does not apply route as directed. 01/01/23   Kozlow, Rema Care, MD  rivaroxaban  (XARELTO ) 20 MG TABS tablet Take 1 tablet (20 mg total) by mouth daily with supper. 03/04/23   Austine Lefort, MD  traMADol  (ULTRAM ) 50 MG tablet Take 1 tablet (50 mg total) by mouth every 6 (six) hours as needed. 07/05/23   Austine Lefort, MD  traZODone  (DESYREL ) 50 MG tablet TAKE 2 TABLETS BY MOUTH AT BEDTIME. 07/04/23   Austine Lefort, MD    Physical Exam: BP 118/79   Pulse 69   Temp 99 F (37.2 C) (Oral)   Resp 19   SpO2 100%  General: Acutely ill, lethargic and frail elderly woman laying in bed. No acute distress. HEENT: [place]/AT. Anicteric sclera. Dry mucous membrane CV: RRR. No murmurs, rubs, or gallops. No LE edema Pulmonary: Lungs CTAB. Normal effort. No wheezing or rales.  Decreased breath sounds at the bases. Abdominal: Soft, nontender, nondistended. Normal bowel sounds. Extremities: Palpable radial and DP pulses. Normal ROM. Skin: Warm and dry. No obvious rash or lesions. Decreased skin turgor Neuro: Drowsy. Moves all extremities. Normal sensation to light touch. No focal deficit.          Labs on Admission:  Basic Metabolic Panel: Recent Labs  Lab 07/31/23 1113 08/06/23 1611  NA 143 141  K 4.3 4.1  CL 105 107  CO2 25 24  GLUCOSE 126* 111*  BUN 29* 37*  CREATININE 1.52* 2.14*  CALCIUM  10.3 9.1   Liver Function Tests: Recent Labs  Lab 08/06/23 1611  AST 37  ALT 26  ALKPHOS 40  BILITOT 0.9  PROT 6.6  ALBUMIN 2.9*   No results for input(s): LIPASE, AMYLASE in the last 168 hours. No results for input(s): AMMONIA in the last 168 hours. CBC: Recent Labs  Lab 08/06/23 1611  WBC 9.0  NEUTROABS 7.4  HGB 9.7*  HCT 31.5*  MCV 108.2*  PLT 140*   Cardiac Enzymes: No results for input(s): CKTOTAL, CKMB, CKMBINDEX, TROPONINI in the last 168 hours. BNP (last 3 results) Recent Labs    03/31/23 0508 04/01/23 0428 05/18/23 1331  BNP 1,953.7* 1,916.4* 302.6*    ProBNP (last 3 results) No results for input(s): PROBNP in the last 8760 hours.  CBG: No results for input(s): GLUCAP in the last 168 hours.  Radiological Exams on Admission: DG Chest Port 1 View Result Date: 08/06/2023 CLINICAL DATA:  Sepsis. Fatigue and weakness. Altered mental status. Fever. Productive cough. EXAM: PORTABLE CHEST 1 VIEW COMPARISON:  05/18/2023 FINDINGS: AICD noted. Wrist right shoulder arthroplasty. Postoperative findings in the cervical spine. Atherosclerotic calcification of the aortic arch. Heart size within normal limits. 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 Reticulonodular opacity laterally in the right upper lobe, possibly from atelectasis or atypical infectious bronchiolitis. Lateral calcifications in the left  breast. No blunting of the costophrenic angles. IMPRESSION: 1. Reticulonodular opacity laterally in the right upper lobe, possibly from atelectasis or atypical infectious bronchiolitis. 2. AICD. 3. 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 4. Lateral calcifications in the left breast. Electronically Signed   By: Freida Jes M.D.   On: 08/06/2023 17:00   Assessment/Plan Ebony Scott is a 73 y.o. 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  # Community-acquired pneumonia # Recurrent pneumonias - Pt with a history of immunosuppression s/p recurrent pneumonias presented with generalized weakness, productive cough and fever - Chest imaging shows reticulonodular opacity laterally in the right upper lobe, possibly from atelectasis or atypical infectious bronchiolitis - Pt recently referred to outpatient pulmonology due to recurrent pneumonias, CT chest completed on 6/11, pending read - Continue IV Rocephin  and azithromycin  - Pulmonology consulted for evaluation - Mucinex  DM twice daily - Follow-up blood culture and sputum culture - Check MRSA screen, full RVP, procalcitonin, urinary Legionella and strep pneumo - Trend CBC, fever curve - Incentive spirometer, flutter valve - Supplemental O2 as needed  # AKI on CKD 3B - Creatinine elevated to 2.14, from baseline of of 1.3-1.5 - Likely secondary to hypovolemia in the setting of decreased p.o. intake - S/p IV LR 500 cc bolus in  the ED.   - Start IV NS at 100 cc/hr for 10 hours and monitor respiratory status closely in the setting of her reduced EF - Trend renal function - Avoid nephrotoxic meds  # Chronic hypoxic respiratory failure # Bronchiectasis # Asthma - Patient only on 2-3 L [place] at night - Currently stable on room air - Continue home bronchodilators - As needed DuoNebs - Pulmonology consulted, appreciate recs  # HFrEF - Last TTE in May 2024 showed EF 30-35%, moderate LV hypokinesis, mild LVH, G1 DD, moderately dilated LA - Patient currently dry on exam - Hold GDMT for now  # Seropositive RA # Chronic steroid use - On total of 7 mg of prednisone  daily - Start stress dose steroids in the setting of acute illness - Scheduled to follow-up with rheumatology on 6/25  # Hypogammaglobulinemia - IgG,  IgA and IgM levels within normal limit 2 months ago - Continue IVIG in the outpatient  # T2DM - Last A1c 6.0% 8 months ago - SSI with meals, CBG monitoring  # Hx of PE/DVT - Chronic and stable - Continue Xarelto   # Chronic pain syndrome - Continue home meds after completion of med rec  # Generalized weakness - In the setting of acute illness - PT/OT eval and treat  # Med rec not completed due to patient being lethargic and inability to reach family, please resume all home meds after completion of med rec  DVT prophylaxis: Xarelto     Code Status: Limited: Do not attempt resuscitation (DNR) -DNR-LIMITED -Do Not Intubate/DNI   Consults called: None  Family Communication: Unable to reach son or daughter by phone  Severity of Illness: The appropriate patient status for this patient is OBSERVATION. Observation status is judged to be reasonable and necessary in order to provide the required intensity of service to ensure the patient's safety. The patient's presenting symptoms, physical exam findings, and initial radiographic and laboratory data in the context of their medical condition is felt to  place them at decreased risk for further clinical deterioration. Furthermore, it is anticipated that the patient will be medically stable for discharge from the hospital within 2 midnights of admission.   Level of care: Med-Surg   This record has been created using Conservation officer, historic buildings. Errors have been sought and corrected, but may not always be located. Such creation errors do not reflect on the standard of care.   Vita Grip, MD 08/06/2023, 7:05 PM [facility] Pager: [phone] Isaiah 41:10   If 7PM-7AM, please contact night-coverage [email or web address] Password [email or web address]

## 2023-08-06 NOTE — Progress Notes (Addendum)
 Patient arrived to unit via stretcher. Vitals taken. Patients tempeture is 102. Patient oriented to call bell and room. Skin assessment complete. Patient lethargic and not able answer admission questions or med recreation. This RN called patients son and daughter but received no answer. Darcia Easter, MD notified. Care plan complete.

## 2023-08-07 ENCOUNTER — Encounter (HOSPITAL_COMMUNITY): Payer: Self-pay | Admitting: Internal Medicine

## 2023-08-07 DIAGNOSIS — A4181 Sepsis due to Enterococcus: Secondary | ICD-10-CM | POA: Diagnosis present

## 2023-08-07 DIAGNOSIS — R0602 Shortness of breath: Secondary | ICD-10-CM | POA: Diagnosis not present

## 2023-08-07 DIAGNOSIS — I38 Endocarditis, valve unspecified: Secondary | ICD-10-CM | POA: Diagnosis not present

## 2023-08-07 DIAGNOSIS — N1832 Chronic kidney disease, stage 3b: Secondary | ICD-10-CM

## 2023-08-07 DIAGNOSIS — E7800 Pure hypercholesterolemia, unspecified: Secondary | ICD-10-CM | POA: Diagnosis present

## 2023-08-07 DIAGNOSIS — Z66 Do not resuscitate: Secondary | ICD-10-CM | POA: Diagnosis present

## 2023-08-07 DIAGNOSIS — D631 Anemia in chronic kidney disease: Secondary | ICD-10-CM | POA: Diagnosis present

## 2023-08-07 DIAGNOSIS — R7881 Bacteremia: Secondary | ICD-10-CM | POA: Diagnosis not present

## 2023-08-07 DIAGNOSIS — R918 Other nonspecific abnormal finding of lung field: Secondary | ICD-10-CM | POA: Diagnosis not present

## 2023-08-07 DIAGNOSIS — R531 Weakness: Secondary | ICD-10-CM | POA: Diagnosis not present

## 2023-08-07 DIAGNOSIS — N170 Acute kidney failure with tubular necrosis: Secondary | ICD-10-CM | POA: Diagnosis present

## 2023-08-07 DIAGNOSIS — Z5181 Encounter for therapeutic drug level monitoring: Secondary | ICD-10-CM | POA: Diagnosis not present

## 2023-08-07 DIAGNOSIS — I5020 Unspecified systolic (congestive) heart failure: Secondary | ICD-10-CM

## 2023-08-07 DIAGNOSIS — E119 Type 2 diabetes mellitus without complications: Secondary | ICD-10-CM

## 2023-08-07 DIAGNOSIS — J189 Pneumonia, unspecified organism: Secondary | ICD-10-CM | POA: Diagnosis present

## 2023-08-07 DIAGNOSIS — I255 Ischemic cardiomyopathy: Secondary | ICD-10-CM | POA: Diagnosis not present

## 2023-08-07 DIAGNOSIS — J45909 Unspecified asthma, uncomplicated: Secondary | ICD-10-CM | POA: Diagnosis not present

## 2023-08-07 DIAGNOSIS — M0600 Rheumatoid arthritis without rheumatoid factor, unspecified site: Secondary | ICD-10-CM | POA: Diagnosis not present

## 2023-08-07 DIAGNOSIS — D801 Nonfamilial hypogammaglobulinemia: Secondary | ICD-10-CM

## 2023-08-07 DIAGNOSIS — I959 Hypotension, unspecified: Secondary | ICD-10-CM | POA: Diagnosis not present

## 2023-08-07 DIAGNOSIS — J984 Other disorders of lung: Secondary | ICD-10-CM | POA: Diagnosis not present

## 2023-08-07 DIAGNOSIS — E1122 Type 2 diabetes mellitus with diabetic chronic kidney disease: Secondary | ICD-10-CM | POA: Diagnosis present

## 2023-08-07 DIAGNOSIS — I5032 Chronic diastolic (congestive) heart failure: Secondary | ICD-10-CM | POA: Diagnosis not present

## 2023-08-07 DIAGNOSIS — J470 Bronchiectasis with acute lower respiratory infection: Secondary | ICD-10-CM | POA: Diagnosis present

## 2023-08-07 DIAGNOSIS — I130 Hypertensive heart and chronic kidney disease with heart failure and stage 1 through stage 4 chronic kidney disease, or unspecified chronic kidney disease: Secondary | ICD-10-CM | POA: Diagnosis present

## 2023-08-07 DIAGNOSIS — G894 Chronic pain syndrome: Secondary | ICD-10-CM | POA: Diagnosis present

## 2023-08-07 DIAGNOSIS — T827XXA Infection and inflammatory reaction due to other cardiac and vascular devices, implants and grafts, initial encounter: Secondary | ICD-10-CM | POA: Diagnosis present

## 2023-08-07 DIAGNOSIS — Y838 Other surgical procedures as the cause of abnormal reaction of the patient, or of later complication, without mention of misadventure at the time of the procedure: Secondary | ICD-10-CM | POA: Diagnosis present

## 2023-08-07 DIAGNOSIS — E039 Hypothyroidism, unspecified: Secondary | ICD-10-CM | POA: Diagnosis present

## 2023-08-07 DIAGNOSIS — I2510 Atherosclerotic heart disease of native coronary artery without angina pectoris: Secondary | ICD-10-CM | POA: Diagnosis present

## 2023-08-07 DIAGNOSIS — N179 Acute kidney failure, unspecified: Secondary | ICD-10-CM | POA: Diagnosis not present

## 2023-08-07 DIAGNOSIS — B952 Enterococcus as the cause of diseases classified elsewhere: Secondary | ICD-10-CM | POA: Diagnosis not present

## 2023-08-07 DIAGNOSIS — J9621 Acute and chronic respiratory failure with hypoxia: Secondary | ICD-10-CM | POA: Diagnosis present

## 2023-08-07 DIAGNOSIS — F32A Depression, unspecified: Secondary | ICD-10-CM | POA: Diagnosis present

## 2023-08-07 DIAGNOSIS — I071 Rheumatic tricuspid insufficiency: Secondary | ICD-10-CM | POA: Diagnosis present

## 2023-08-07 DIAGNOSIS — J471 Bronchiectasis with (acute) exacerbation: Secondary | ICD-10-CM | POA: Diagnosis not present

## 2023-08-07 DIAGNOSIS — M059 Rheumatoid arthritis with rheumatoid factor, unspecified: Secondary | ICD-10-CM | POA: Diagnosis present

## 2023-08-07 DIAGNOSIS — I10 Essential (primary) hypertension: Secondary | ICD-10-CM | POA: Diagnosis not present

## 2023-08-07 DIAGNOSIS — N189 Chronic kidney disease, unspecified: Secondary | ICD-10-CM | POA: Diagnosis not present

## 2023-08-07 DIAGNOSIS — I951 Orthostatic hypotension: Secondary | ICD-10-CM | POA: Diagnosis not present

## 2023-08-07 DIAGNOSIS — I272 Pulmonary hypertension, unspecified: Secondary | ICD-10-CM | POA: Diagnosis present

## 2023-08-07 DIAGNOSIS — J4540 Moderate persistent asthma, uncomplicated: Secondary | ICD-10-CM | POA: Diagnosis not present

## 2023-08-07 DIAGNOSIS — G4736 Sleep related hypoventilation in conditions classified elsewhere: Secondary | ICD-10-CM | POA: Diagnosis not present

## 2023-08-07 DIAGNOSIS — E861 Hypovolemia: Secondary | ICD-10-CM | POA: Diagnosis present

## 2023-08-07 DIAGNOSIS — R053 Chronic cough: Secondary | ICD-10-CM | POA: Diagnosis not present

## 2023-08-07 DIAGNOSIS — B957 Other staphylococcus as the cause of diseases classified elsewhere: Secondary | ICD-10-CM | POA: Diagnosis not present

## 2023-08-07 DIAGNOSIS — G4733 Obstructive sleep apnea (adult) (pediatric): Secondary | ICD-10-CM | POA: Diagnosis not present

## 2023-08-07 DIAGNOSIS — I5022 Chronic systolic (congestive) heart failure: Secondary | ICD-10-CM | POA: Diagnosis present

## 2023-08-07 DIAGNOSIS — E876 Hypokalemia: Secondary | ICD-10-CM | POA: Diagnosis present

## 2023-08-07 DIAGNOSIS — Z1152 Encounter for screening for COVID-19: Secondary | ICD-10-CM | POA: Diagnosis not present

## 2023-08-07 DIAGNOSIS — Z7901 Long term (current) use of anticoagulants: Secondary | ICD-10-CM | POA: Diagnosis not present

## 2023-08-07 DIAGNOSIS — Z95810 Presence of automatic (implantable) cardiac defibrillator: Secondary | ICD-10-CM | POA: Diagnosis not present

## 2023-08-07 LAB — BASIC METABOLIC PANEL WITH GFR
Anion gap: 10 (ref 5–15)
BUN: 30 mg/dL — ABNORMAL HIGH (ref 8–23)
CO2: 20 mmol/L — ABNORMAL LOW (ref 22–32)
Calcium: 8.5 mg/dL — ABNORMAL LOW (ref 8.9–10.3)
Chloride: 108 mmol/L (ref 98–111)
Creatinine, Ser: 1.6 mg/dL — ABNORMAL HIGH (ref 0.44–1.00)
GFR, Estimated: 33 mL/min — ABNORMAL LOW (ref >60)
Glucose, Bld: 106 mg/dL — ABNORMAL HIGH (ref 70–99)
Potassium: 4.1 mmol/L (ref 3.5–5.1)
Sodium: 138 mmol/L (ref 135–145)

## 2023-08-07 LAB — GLUCOSE, CAPILLARY
Glucose-Capillary: 107 mg/dL — ABNORMAL HIGH (ref 70–99)
Glucose-Capillary: 140 mg/dL — ABNORMAL HIGH (ref 70–99)
Glucose-Capillary: 140 mg/dL — ABNORMAL HIGH (ref 70–99)
Glucose-Capillary: 205 mg/dL — ABNORMAL HIGH (ref 70–99)

## 2023-08-07 LAB — BLOOD CULTURE ID PANEL (REFLEXED) - BCID2

## 2023-08-07 LAB — CBC
HCT: 32.5 % — ABNORMAL LOW (ref 36.0–46.0)
Hemoglobin: 10.1 g/dL — ABNORMAL LOW (ref 12.0–15.0)
MCH: 32.8 pg (ref 26.0–34.0)
MCHC: 31.1 g/dL (ref 30.0–36.0)
MCV: 105.5 fL — ABNORMAL HIGH (ref 80.0–100.0)
Platelets: 113 10*3/uL — ABNORMAL LOW (ref 150–400)
RBC: 3.08 MIL/uL — ABNORMAL LOW (ref 3.87–5.11)
RDW: 15.3 % (ref 11.5–15.5)
WBC: 7.3 10*3/uL (ref 4.0–10.5)
nRBC: 0 % (ref 0.0–0.2)

## 2023-08-07 LAB — HEMOGLOBIN A1C
Hgb A1c MFr Bld: 6.5 % — ABNORMAL HIGH (ref 4.8–5.6)
Mean Plasma Glucose: 139.85 mg/dL

## 2023-08-07 LAB — PROCALCITONIN: Procalcitonin: 1.05 ng/mL

## 2023-08-07 MED ORDER — GABAPENTIN 300 MG PO CAPS
300.0000 mg | ORAL_CAPSULE | Freq: Every day | ORAL | Status: DC
  Administered 2023-08-07 – 2023-08-21 (×15): 300 mg via ORAL
  Filled 2023-08-07 (×15): qty 1

## 2023-08-07 MED ORDER — ISOSORB DINITRATE-HYDRALAZINE 20-37.5 MG PO TABS
1.0000 | ORAL_TABLET | Freq: Two times a day (BID) | ORAL | Status: DC
  Administered 2023-08-07 – 2023-08-16 (×18): 1 via ORAL
  Filled 2023-08-07 (×19): qty 1

## 2023-08-07 MED ORDER — FOLIC ACID 1 MG PO TABS
1.0000 mg | ORAL_TABLET | Freq: Every day | ORAL | Status: DC
  Administered 2023-08-07 – 2023-08-22 (×16): 1 mg via ORAL
  Filled 2023-08-07 (×16): qty 1

## 2023-08-07 MED ORDER — FUROSEMIDE 40 MG PO TABS
40.0000 mg | ORAL_TABLET | ORAL | Status: DC
  Administered 2023-08-08: 40 mg via ORAL
  Filled 2023-08-07: qty 1

## 2023-08-07 MED ORDER — VITAMIN D 25 MCG (1000 UNIT) PO TABS
1000.0000 [IU] | ORAL_TABLET | Freq: Every day | ORAL | Status: DC
  Administered 2023-08-07 – 2023-08-21 (×15): 1000 [IU] via ORAL
  Filled 2023-08-07 (×15): qty 1

## 2023-08-07 MED ORDER — RISAQUAD PO CAPS
1.0000 | ORAL_CAPSULE | Freq: Every evening | ORAL | Status: DC
  Administered 2023-08-07 – 2023-08-10 (×4): 1 via ORAL
  Filled 2023-08-07 (×4): qty 1

## 2023-08-07 MED ORDER — ATORVASTATIN CALCIUM 80 MG PO TABS
80.0000 mg | ORAL_TABLET | Freq: Every evening | ORAL | Status: DC
  Administered 2023-08-07 – 2023-08-21 (×15): 80 mg via ORAL
  Filled 2023-08-07 (×5): qty 1
  Filled 2023-08-07: qty 2
  Filled 2023-08-07 (×3): qty 1
  Filled 2023-08-07: qty 2
  Filled 2023-08-07 (×2): qty 1
  Filled 2023-08-07: qty 2
  Filled 2023-08-07 (×2): qty 1

## 2023-08-07 MED ORDER — SACUBITRIL-VALSARTAN 24-26 MG PO TABS
1.0000 | ORAL_TABLET | Freq: Two times a day (BID) | ORAL | Status: DC
  Administered 2023-08-07 – 2023-08-11 (×8): 1 via ORAL
  Filled 2023-08-07 (×8): qty 1

## 2023-08-07 MED ORDER — FLUTICASONE PROPIONATE 50 MCG/ACT NA SUSP
2.0000 | Freq: Every day | NASAL | Status: DC
  Administered 2023-08-07 – 2023-08-22 (×11): 2 via NASAL
  Filled 2023-08-07: qty 16

## 2023-08-07 MED ORDER — GUAIFENESIN ER 600 MG PO TB12
600.0000 mg | ORAL_TABLET | Freq: Two times a day (BID) | ORAL | Status: DC
  Administered 2023-08-07 – 2023-08-17 (×21): 600 mg via ORAL
  Filled 2023-08-07 (×22): qty 1

## 2023-08-07 MED ORDER — PANTOPRAZOLE SODIUM 40 MG PO TBEC
40.0000 mg | DELAYED_RELEASE_TABLET | Freq: Every day | ORAL | Status: DC
  Administered 2023-08-07 – 2023-08-22 (×16): 40 mg via ORAL
  Filled 2023-08-07 (×16): qty 1

## 2023-08-07 MED ORDER — VANCOMYCIN HCL IN DEXTROSE 1-5 GM/200ML-% IV SOLN
1000.0000 mg | Freq: Once | INTRAVENOUS | Status: AC
  Administered 2023-08-07: 1000 mg via INTRAVENOUS
  Filled 2023-08-07: qty 200

## 2023-08-07 MED ORDER — EMPAGLIFLOZIN 10 MG PO TABS
10.0000 mg | ORAL_TABLET | Freq: Every day | ORAL | Status: DC
  Administered 2023-08-08 – 2023-08-22 (×15): 10 mg via ORAL
  Filled 2023-08-07 (×15): qty 1

## 2023-08-07 MED ORDER — VANCOMYCIN HCL 500 MG/100ML IV SOLN
500.0000 mg | INTRAVENOUS | Status: DC
  Filled 2023-08-07: qty 100

## 2023-08-07 MED ORDER — CLOPIDOGREL BISULFATE 75 MG PO TABS
75.0000 mg | ORAL_TABLET | Freq: Every day | ORAL | Status: DC
  Administered 2023-08-07 – 2023-08-16 (×10): 75 mg via ORAL
  Filled 2023-08-07 (×10): qty 1

## 2023-08-07 NOTE — Progress Notes (Signed)
 PHARMACY - PHYSICIAN COMMUNICATION CRITICAL VALUE ALERT - BLOOD CULTURE IDENTIFICATION (BCID)  Ebony Scott is an 73 y.o. female who presented to [facility] on 08/06/2023 with a chief complaint of generalized weakness, fatigue, fever and cough   Assessment:  73 year old female admitted with general malaise /concern for recurrent pneumonia. Now with enterococcus faecalis in both sets of blood cultures. Notably patient has an ICD.   Name of physician (or Provider) Contacted: Sreeram/Singh (ID)    Current antibiotics: ceftriaxone /azithromycin    Changes to prescribed antibiotics recommended:  Add vancomycin  for now - Patient with penicillin allergy    Results for orders placed or performed during the hospital encounter of 08/06/23  Blood Culture ID Panel (Reflexed) (Collected: 08/06/2023  6:41 PM)  Result Value Ref Range   Enterococcus faecalis DETECTED (A) NOT DETECTED   Enterococcus Faecium NOT DETECTED NOT DETECTED   Listeria monocytogenes NOT DETECTED NOT DETECTED   Staphylococcus species NOT DETECTED NOT DETECTED   Staphylococcus aureus (BCID) NOT DETECTED NOT DETECTED   Staphylococcus epidermidis NOT DETECTED NOT DETECTED   Staphylococcus lugdunensis NOT DETECTED NOT DETECTED   Streptococcus species NOT DETECTED NOT DETECTED   Streptococcus agalactiae NOT DETECTED NOT DETECTED   Streptococcus pneumoniae NOT DETECTED NOT DETECTED   Streptococcus pyogenes NOT DETECTED NOT DETECTED   A.calcoaceticus-baumannii NOT DETECTED NOT DETECTED   Bacteroides fragilis NOT DETECTED NOT DETECTED   Enterobacterales NOT DETECTED NOT DETECTED   Enterobacter cloacae complex NOT DETECTED NOT DETECTED   Escherichia coli NOT DETECTED NOT DETECTED   Klebsiella aerogenes NOT DETECTED NOT DETECTED   Klebsiella oxytoca NOT DETECTED NOT DETECTED   Klebsiella pneumoniae NOT DETECTED NOT DETECTED   Proteus species NOT DETECTED NOT DETECTED   Salmonella species NOT DETECTED NOT DETECTED   Serratia marcescens  NOT DETECTED NOT DETECTED   Haemophilus influenzae NOT DETECTED NOT DETECTED   Neisseria meningitidis NOT DETECTED NOT DETECTED   Pseudomonas aeruginosa NOT DETECTED NOT DETECTED   Stenotrophomonas maltophilia NOT DETECTED NOT DETECTED   Candida albicans NOT DETECTED NOT DETECTED   Candida auris NOT DETECTED NOT DETECTED   Candida glabrata NOT DETECTED NOT DETECTED   Candida krusei NOT DETECTED NOT DETECTED   Candida parapsilosis NOT DETECTED NOT DETECTED   Candida tropicalis NOT DETECTED NOT DETECTED   Cryptococcus neoformans/gattii NOT DETECTED NOT DETECTED   Vancomycin  resistance NOT DETECTED NOT DETECTED    Denson Flake, PharmD, BCPS, BCIDP Infectious Diseases Clinical Pharmacist Phone: [phone] 08/07/2023  1:54 PM

## 2023-08-07 NOTE — Progress Notes (Signed)
 Progress Note   Patient: Ebony Scott:[id number] DOB: [birth year] DOA: 08/06/2023     0 DOS: the patient was seen and examined on 08/07/2023   Brief hospital course:  Ebony Scott is a 73 y.o. 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 She is admitted for community acquired pneumonia management. Seen by pulmonologist.   Assessment and Plan: Community-acquired pneumonia Recurrent pneumonias History of immunosuppression s/p recurrent pneumonias presented with generalized weakness, productive cough and fever Chest imaging shows reticulonodular opacity laterally in the right upper lobe, possibly from atelectasis or atypical infectious bronchiolitis Pt recently referred to outpatient pulmonology due to recurrent pneumonias, CT chest completed on 6/11, pending read Continue IV Rocephin  and azithromycin  Pulmonology consult appreciated, advised to continue current antibiotic regimen. Mucinex  DM twice daily Follow-up blood culture and sputum culture MRSA screen negative, RVP negative, urine legionella pending, strep negative Encourage incentive spirometry, out of bed to chair. Supplemental O2 as needed   AKI on CKD 3B Creatinine elevated to 2.14 improved to  baseline of 1.3-1.5 with IV hydration. Renal dysfunction secondary to hypovolemia in the setting of decreased p.o. intake Avoid nephrotoxic meds.   Chronic hypoxic respiratory failure Bronchiectasis Asthma At baseline patient uses 2-3 L [place] at night Currently on 2L at  rest. Continue home bronchodilators Continue as needed [facility] Pulmonology consult appreciated.   HFrEF Last TTE in May 2024 showed EF 30-35%, moderate LV hypokinesis, mild LVH, G1 DD, moderately dilated LA Resumed GDMT.  Seropositive RA Chronic steroid use Takes 7 mg of prednisone  daily at home which is held now. Will give solu cortef  50 q6 stress dose steroids in the setting of acute illness Scheduled to follow-up with rheumatology on 6/25   Hypogammaglobulinemia IgG, IgA and IgM levels within normal limit 2 months ago Continue IVIG as outpatient   Type 2 diabetes mellitus Last A1c 6.0% 8 months ago Continue sliding scale with insulin  given steroid use.  Hx of PE/DVT Chronic and stable Continue Xarelto    Chronic pain syndrome Resumed    Generalized weakness In the setting of acute illness. PT/OT eval called      Out of bed to chair. Incentive spirometry. Nursing supportive care. Fall, aspiration precautions. Diet:  Diet Orders (From admission, onward)     Start     Ordered   08/06/23 1941  Diet Heart Room service appropriate? Yes; Fluid consistency: Thin  Diet effective now       Question Answer Comment  Room service appropriate? Yes   Fluid consistency: Thin      08/06/23 1941           DVT prophylaxis:  rivaroxaban  (XARELTO ) tablet 20 mg  Level of care: Med-Surg   Code Status: Limited: Do not attempt resuscitation (DNR) -DNR-LIMITED -Do Not Intubate/DNI   Subjective: Patient is seen and examined today morning. She is on 2L supplemental o2. Has productive cough.   Physical Exam: Vitals:   08/06/23 2008 08/06/23 2300 08/07/23 0244 08/07/23 0537  BP: (!) 142/66 129/67 (!) 162/71 (!) 154/83  Pulse: 77 68 71 66  Resp: 19 18 17 18   Temp: Ebony Scott)  102.2 F (39 C) 99.8 F (37.7 C) 99.2 F (37.3 C) 98.8 F (37.1 C)  TempSrc: Oral Oral Oral Oral  SpO2: 92% 97% 95% 100%    General - Elderly Caucasian weak female, mild respiratory distress HEENT -  PERRLA, EOMI, atraumatic head, non tender sinuses. Lung - Clear, diffuse rales, rhonchi, wheezes. Heart - S1, S2 heard, no murmurs, rubs, trace pedal edema. Abdomen - Soft, non tender, bowel sounds good Neuro - Alert, awake and oriented x 3, non focal exam. Skin - Warm and dry.  Data Reviewed:      Latest Ref Rng & Units 08/07/2023    4:30 AM 08/06/2023    4:11 PM 06/03/2023   10:29 AM  CBC  WBC 4.0 - 10.5 K/uL 7.3  9.0  8.3   Hemoglobin 12.0 - 15.0 g/dL 04.5  9.7  9.4   Hematocrit 36.0 - 46.0 % 32.5  31.5  28.5   Platelets 150 - 400 K/uL 113  140  187       Latest Ref Rng & Units 08/07/2023    4:30 AM 08/06/2023    4:11 PM 07/31/2023   11:13 AM  BMP  Glucose 70 - 99 mg/dL 409  811  914   BUN 8 - 23 mg/dL 30  37  29   Creatinine 0.44 - 1.00 mg/dL 7.82  9.56  2.13   BUN/Creat Ratio 6 - 22 (calc)   19   Sodium 135 - 145 mmol/L 138  141  143   Potassium 3.5 - 5.1 mmol/L 4.1  4.1  4.3   Chloride 98 - 111 mmol/L 108  107  105   CO2 22 - 32 mmol/L 20  24  25    Calcium  8.9 - 10.3 mg/dL 8.5  9.1  08.6    DG Chest Port 1 View Result Date: 08/06/2023 CLINICAL DATA:  Sepsis. Fatigue and weakness. Altered mental status. Fever. Productive cough. EXAM: PORTABLE CHEST 1 VIEW COMPARISON:  05/18/2023 FINDINGS: AICD noted. Wrist right shoulder arthroplasty. Postoperative findings in the cervical spine. Atherosclerotic calcification of the aortic arch. Heart size within normal limits. 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 Reticulonodular opacity laterally in the right upper lobe, possibly from atelectasis or atypical infectious bronchiolitis. Lateral calcifications in the left breast. No blunting of the costophrenic angles. IMPRESSION: 1. Reticulonodular opacity laterally in the right upper lobe, possibly from atelectasis or atypical infectious bronchiolitis. 2. AICD. 3. 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 4. Lateral calcifications in the left breast.  Electronically Signed   By: Freida Jes M.D.   On: 08/06/2023 17:00    Family Communication: Discussed with patient, she understand and agree. All questions answered.  Disposition: Status is: changed to Inpatient Remains inpatient appropriate because: IV antibiotics, weakness.  Planned Discharge Destination: Home with Home Health     Time spent: 39 minutes  Author: Aisha Hove, MD 08/07/2023 11:23 AM Secure chat 7am to 7pm For on call review [email or web address].

## 2023-08-07 NOTE — Progress Notes (Signed)
 The Clinical research associate received a call from the patient's son, Kamri Gotsch, whom is one of the patient's POA. Per Mr. Landrigan, the patients daughter,  Maranatha Grossi is also POA.  I was able to complete the admission questions at this time. Will continue to assess for patient's.

## 2023-08-07 NOTE — Consult Note (Signed)
 NAME:  Ebony Scott, MRN:  [id number], DOB:  06/13/1950, LOS: 0 ADMISSION DATE:  08/06/2023, CONSULTATION DATE: 08/07/2023 REFERRING MD:  Dr Butch Cashing, CHIEF COMPLAINT: Cough, shortness of breath, altered mental status  History of Present Illness:  Patient with a known history of bronchiectasis, came in with worsening shortness of breath, cough, altered mental status, fevers of 103 was last hospitalized about March  Was last hospitalized about March for pneumonia Has had recurrent pneumonias over the years History of bronchiectasis, asthma, chronic respiratory failure-on 2 L of oxygen  at night, obstructive sleep apnea/on CPAP, history of tracheobronchomalacia History of 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  therapy, 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, chronic kidney disease stage IIIb,  She had had a CT scan in March 2025 with extensive infiltrative process, a repeat about 10 days ago showed improvement  Chest x-ray during this hospitalization does show what appears to be a new right upper lobe infiltrate  Pertinent  Medical History   Past Medical History:  Diagnosis Date   Anemia    Angio-edema    Breast cancer (HCC) 1998   Left breast, in remission   Bronchiectasis (HCC)    CHF (congestive heart failure) (HCC)    Closed displaced fracture of metatarsal bone of right foot 01/20/2019   Closed fracture of fifth metatarsal bone of right foot, initial encounter 01/15/2018   Clostridium difficile colitis 01/14/2018   Coronary artery disease    Depression    some; don't take anything for it (02/20/2012), pt denies dx as of 06/02/21   Diverticulosis    DVT (deep venous thrombosis) (HCC)    Fibromyalgia 11/2011   Fracture of base of fifth metatarsal bone with routine healing, left    GAVE (gastric antral vascular ectasia)    GERD (gastroesophageal reflux disease)    Headache(784.0)    related to allergies; more at different times during the year (02/20/2012)    Hemorrhoids    Hiatal hernia    back and neck   History of blood transfusion 2023   1 unit, 5 units of Iron    Hx of adenomatous colonic polyps 04/12/2016   Hypercholesteremia    good cholesterol is high   Hypothyroidism    h/o Graves disease   IBS (irritable bowel syndrome)    Moderate persistent asthma    -FeV1 72% 2011, -IgE 102 2011, CT sinus Neg 2011   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 Larrie Po   Sleep apnea    uses cpap nightly   Tracheobronchomalacia    Vasculitis (HCC) 12/13/2017    Significant Hospital Events: Including procedures, antibiotic start and stop dates in addition to other pertinent events   07/31/2023-CT scan chest-significant improvement in multifocal infiltrate 6/17-chest x-ray showing a new right upper lobe infiltrate  Interim History / Subjective:  Cough, shortness of breath Denies any chest pain or chest discomfort   Objective    Blood pressure (!) 154/83, pulse 66, temperature 98.8 F (37.1 C), temperature source Oral, resp. rate 18, SpO2 100%.        Intake/Output Summary (Last 24 hours) at 08/07/2023 1101 Last data filed at 08/07/2023 0800 Gross per 24 hour  Intake 100 ml  Output 300 ml  Net -200 ml   There were no vitals filed  for this visit.  Examination: General: Elderly, does not appear to be in distress, HENT: Moist oral mucosa Lungs: Decreased air movement bilaterally, no rhonchi Cardiovascular: S1-S2 appreciated Abdomen: Soft, bowel sounds appreciated Extremities: No clubbing, no edema Neuro: Awake alert oriented x 3 GU:   I reviewed last 24 h vitals and pain scores, last 48 h intake and output, last 24 h labs and trends, and last 24 h imaging results.  Resolved problem list   Assessment and Plan    Community-acquired pneumonia - Agree with current antibiotics-reviewing her records, I do not see any cultures showing unusual organisms previously -Sputum for Gram stain and cultures -Some elevation of procalcitonin on 6/18 - Continue expectorants - Respiratory viral panel - Incentive spirometer use  History of bronchiectasis - Focus on sputum clearance  HFrEF - Was on GDMT at home - On hold at present  Chronically on steroids - Started on stress dose steroids  History of hypogammaglobulinemia Chronic pain syndrome Type 2 diabetes  History of PE/DVT - Continue anticoagulation  Pulmonary will follow  Myer Artis, MD [place] [facility] Pager: See Tilford Foley

## 2023-08-07 NOTE — Progress Notes (Signed)
 Pharmacy Antibiotic Note  Ebony Scott is a 73 y.o. female admitted on 08/06/2023 with Enterococcus bacteremia. Scr elevated 1.60 mg/dL with an estimated CrCl of 24 ml/min. WBC WNL. Pharmacy has been consulted for Vancomycin  dosing.   Plan: Vancomycin  1000mg  IV loading dose once.  Vancomycin  500mg  IV every 24 hours. Goal AUC 483 (400-600).  Monitor renal functions and vancomycin  levels as appropriate.     Temp (24hrs), Avg:100 F (37.8 C), Min:98.1 F (36.7 C), Max:103.1 F (39.5 C)  Recent Labs  Lab 08/06/23 1611 08/06/23 1622 08/07/23 0430  WBC 9.0  --  7.3  CREATININE 2.14*  --  1.60*  LATICACIDVEN  --  1.3  --     Estimated Creatinine Clearance: 24 mL/min (A) (by C-G formula based on SCr of 1.6 mg/dL (H)).    Allergies  Allergen Reactions   Baclofen  Anaphylaxis and Other (See Comments)    Altered mental status requiring a 3-day hospital stay- patient became unresponsive   Put me in a coma for five days, Altered mental status requiring a 3-day hospital stay- patient became unresponsive   Other Shortness Of Breath and Other (See Comments)    Grass and weeds sneezing; filled sinuses (02/20/2012)   Pork-Derived Products Anaphylaxis   Shellfish Allergy  Anaphylaxis, Shortness Of Breath, Itching and Rash   Shrimp Extract Anaphylaxis and Other (See Comments)    ALL SHELLFISH   Tetracycline Hcl Nausea And Vomiting   Xolair  [Omalizumab ] Other (See Comments)    Caused Blood clot   Zoledronic  Acid Other (See Comments)    Reclast - Fever, Put in hospital, dr said it was a reaction from a reaction    Fever, inability to walk, Put in hospital  Fever, Put in hospital, dr said it was a reaction from a reaction , Other reaction(s): Unknown   Celecoxib Swelling    Feet swelling   Dilaudid  [Hydromorphone  Hcl] Itching   Hydrocodone -Acetaminophen  Itching   Levofloxacin  Other (See Comments)    GI upset   Molds & Smuts Cough   Morphine  And Codeine Hives and Itching   Oxycodone   Hcl Nausea And Vomiting    GI Intolerance   Paroxetine Nausea And Vomiting and Other (See Comments)    GI Intolerance also   Penicillins Rash and Other (See Comments)    welts (02/20/2012)  Tolerated Ancef  02/16/20   Tetracycline Hcl Other (See Comments)    GI Intolerance   Oxycodone -Acetaminophen  Itching   Diltiazem  Hcl Swelling   Dust Mite Extract Other (See Comments) and Cough    sneezing (02/20/2012) too   Gamunex [Immune Globulin ] Itching and Rash    Tolerates hizentra     Penicillin G Procaine Rash and Other (See Comments)    Welts, also   Rituximab  Other (See Comments)    Anemia    Rofecoxib Swelling    Vioxx- feet swelling   Tree Extract Itching    tested and told I was allergic to it; never experienced a reaction to it (02/20/2012)   Microbiology results: 08/06/23 BCx: Enterococcus Faecalis 08/06/23 MRSA PCR: Negative   Thank you for allowing pharmacy to be a part of this patient's care.  Vashti Gentles PharmD Candidate 08/07/2023 2:03 PM

## 2023-08-07 NOTE — Evaluation (Signed)
 Occupational Therapy Evaluation Patient Details Name: Ebony Scott MRN: [id number] DOB: Jan 21, 1951 Today's Date: 08/07/2023   History of Present Illness   Patient is a 73 y.o. who presents to the [facility] ED on 08/06/23 for evaluation of a fever, weakness and looking generally unwell prompting daughter to call EMS. Chest CT: suspicious for progressive multilobar  pneumonia.  Pt with history of immunosuppression (chronic prednisone  for RA/weekly IVIG for hypogammaglobulinemia)-chronic HFrEF-s/p ICD, bronchiectasis/asthma with chronic hypoxic respiratory failure on nocturnal oxygen , chronic kidney disease, diabetes rheumatoid arthritis, ischemic cardiomyopathy, coronary artery disease hypogammaglobulinemia allergies, irritable bowel syndrome, breast cancer, pseudogout asthma, fibromyalgia, gastric antral vascular ectasia, sepsis, pneumonia.   Pt with previous admission on 05/18/23 with fever, RLL< pneumonia, hypoxia, SOB.     Clinical Impressions Patient is currently requiring as high as contact to minimal assistance with basic ADLs, as well as  CGA assist with functional transfers to toilet with use of RW.   Current level of function is below patient's typical baseline.    During this evaluation, patient was limited by baseline RA, generalized weakness, impaired activity tolerance, and chronic foot pain of unknown origin, all of which has the potential to impact patient's and/or caregivers' safety and independence during functional mobility, as well as performance for ADLs.    Patient lives with her daughter who  is unable to provide much supervision and assistance as she is currently injured and stays in basement of their home.  Patient demonstrates good rehab potential, and should benefit from continued skilled occupational therapy services while in acute care to maximize safety, independence and quality of life at home.  Home health OT is recommended to reinforce joint protection and work on progressing  IADLs.  ?      If plan is discharge home, recommend the following:   A little help with walking and/or transfers;A little help with bathing/dressing/bathroom;Help with stairs or ramp for entrance     Functional Status Assessment   Patient has had a recent decline in their functional status and demonstrates the ability to make significant improvements in function in a reasonable and predictable amount of time.     Equipment Recommendations   None recommended by OT     Recommendations for Other Services   PT consult     Precautions/Restrictions   Precautions Precautions: Fall Restrictions Weight Bearing Restrictions Per Provider Order: No     Mobility Bed Mobility Overal bed mobility: Needs Assistance Bed Mobility: Supine to Sit     Supine to sit: Supervision, HOB elevated, Used rails          Transfers                          Balance Overall balance assessment: Needs assistance Sitting-balance support: No upper extremity supported, Feet unsupported Sitting balance-Leahy Scale: Fair Sitting balance - Comments: Posterior bias when donning socks but no LOB     Standing balance-Leahy Scale: Fair Standing balance comment: Could stand at sink and release hands for hygiene without external assist.                           ADL either performed or assessed with clinical judgement   ADL Overall ADL's : Needs assistance/impaired Eating/Feeding: Set up Eating/Feeding Details (indicate cue type and reason): due to hand RA. Pt unable to open package containing socks with both hands and increased time/effort. Could benefit from lightweight bult up handles. Grooming:  Wash/dry hands;Standing;Supervision/safety Grooming Details (indicate cue type and reason): Cues for walker positioning. Upper Body Bathing: Contact guard assist;Sitting   Lower Body Bathing: Contact guard assist;Sitting/lateral leans;Sit to/from stand   Upper Body  Dressing : Set up;Sitting Upper Body Dressing Details (indicate cue type and reason): for overhead short Lower Body Dressing: Sitting/lateral leans;Set up;Supervision/safety Lower Body Dressing Details (indicate cue type and reason): Donned socks at EOB with SBA for balance/safety, increased effort. Toilet Transfer: Rolling walker (2 wheels);Ambulation;Supervision/safety;Contact guard assist;Regular Toilet;Cueing for Chief of Staff Details (indicate cue type and reason): Pt is used to rollator. Difficulty following cues for hand placement and use of grab bar rather than holding RW when coming off/on toilet. Reinforced RW safety. Toileting- Clothing Manipulation and Hygiene: Supervision/safety;Sitting/lateral lean;Sit to/from stand       Functional mobility during ADLs: Contact guard assist;Supervision/safety;Rolling walker (2 wheels);Cueing for safety       Vision Baseline Vision/History: 0 No visual deficits (reading glasses only) Patient Visual Report: No change from baseline Vision Assessment?: No apparent visual deficits     Perception         Praxis         Pertinent Vitals/Pain Pain Assessment Pain Assessment: 0-10 Pain Score: 5  Pain Location: B feet. Pt does not know cause. Chronic for years. Pain Descriptors / Indicators: Sharp, Constant Pain Intervention(s): Limited activity within patient's tolerance, Monitored during session, Repositioned, Patient requesting pain meds-RN notified, RN gave pain meds during session     Extremity/Trunk Assessment Upper Extremity Assessment Upper Extremity Assessment: Right hand dominant;RUE deficits/detail;LUE deficits/detail RUE Deficits / Details: RA deformites with OA crepedus noted with MMT. WFL ROM but severe weakness of 3-/5 to 3+/5. RA node on D5 which is painful. 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 WFL ROM but severe weakness of 3-/5 to  3+/5. LUE Coordination: decreased fine motor   Lower Extremity Assessment Lower Extremity Assessment: Defer to PT evaluation       Communication Communication Communication: No apparent difficulties   Cognition Arousal: Alert Behavior During Therapy: WFL for tasks assessed/performed Cognition: No apparent impairments             OT - Cognition Comments: Ox4. Has some mild memory deficits.                 Following commands: Intact       Cueing  General Comments          Exercises Other Exercises Other Exercises: Pt provided with handouts and education on joint protection related to RA. Handout also provided for energy conservation but education on this truncated as CM to room.   Shoulder Instructions      Home Living Family/patient expects to be discharged to:: Private residence Living Arrangements: Children Available Help at Discharge: Family;Available PRN/intermittently Type of Home: House Home Access: Stairs to enter [employer] of Steps: 6 Entrance Stairs-Rails: Right;Left Home Layout: Two level;Able to live on main level with bedroom/bathroom Alternate Level Stairs-Number of Steps: Pt confirms all information.   Bathroom Shower/Tub: Producer, television/film/video: Handicapped height Bathroom Accessibility: Yes How Accessible: Accessible via walker Home Equipment: Rollator (4 wheels);BSC/3in1;Shower seat;Cane - single point;Cane - quad;Other (comment) (Some type of WC in basement used for pt's mom but pt cannot recall style.)   Additional Comments: pts daughter lives in the basement and currently has a knee injury.      Prior Functioning/Environment Prior Level of Function :  Needs assist       Physical Assist : ADLs (physical)   ADLs (physical): IADLs Mobility Comments: pt reports using rollator walker in the home and community distances ADLs Comments: independent. Showers when someone is there. Pt unable to expand on this.  Later mentioned pt hires a kid in the neighborhood for shower supervison.  RN to room and assisted with infor from son. Nurse 1x every 2 weeks.  Dtr does laundry. Pt cooks breakfast or warms things up.    OT Problem List: Decreased activity tolerance;Decreased range of motion;Decreased strength;Pain;Impaired sensation;Impaired balance (sitting and/or standing)   OT Treatment/Interventions: Self-care/ADL training;Balance training;Therapeutic exercise;Therapeutic activities;Neuromuscular education;Energy conservation;DME and/or AE instruction;Patient/family education      OT Goals(Current goals can be found in the care plan section)   Acute Rehab OT Goals Patient Stated Goal: Go home OT Goal Formulation: With patient Time For Goal Achievement: 08/21/23 Potential to Achieve Goals: Good ADL Goals Pt Will Perform Grooming: with modified independence;standing Pt Will Transfer to Toilet: with modified independence;ambulating Pt Will Perform Toileting - Clothing Manipulation and hygiene: with modified independence Additional ADL Goal #1: Patient will identify at least 3 energy conservation strategies to employ at home in order to maximize function and quality of life and decrease caregiver burden while preventing exacerbation of symptoms and rehospitalization. Additional ADL Goal #2: Pt will use her handout as needed to identify at least 3 ways she can protect her joints and avoid progression of RA deformity and pain in hands/UEs while performing BADLs and IADLs.   OT Frequency:  Min 2X/week    Co-evaluation              AM-PAC OT 6 Clicks Daily Activity     Outcome Measure Help from another person eating meals?: A Little Help from another person taking care of personal grooming?: A Little Help from another person toileting, which includes using toliet, bedpan, or urinal?: A Little Help from another person bathing (including washing, rinsing, drying)?: A Little Help from another  person to put on and taking off regular upper body clothing?: A Little Help from another person to put on and taking off regular lower body clothing?: A Little 6 Click Score: 18   End of Session Equipment Utilized During Treatment: Gait belt;Rolling walker (2 wheels);Oxygen  Nurse Communication: Mobility status  Activity Tolerance: Patient tolerated treatment well Patient left: in chair;with call bell/phone within reach;with chair alarm set;with nursing/sitter in room  OT Visit Diagnosis: Unsteadiness on feet (R26.81);Pain;Muscle weakness (generalized) (M62.81) Pain - Right/Left:  (Bil) Pain - part of body: Ankle and joints of foot                Time: 1347-1435 OT Time Calculation (min): 48 min Charges:  OT General Charges $OT Visit: 1 Visit OT Evaluation $OT Eval Low Complexity: 1 Low OT Treatments $Self Care/Home Management : 8-22 mins $Therapeutic Activity: 8-22 mins  [facility], OT Acute Rehab Services Office: [phone] 08/07/2023   Asher Blade 08/07/2023, 2:54 PM

## 2023-08-07 NOTE — TOC Initial Note (Signed)
 Transition of Care [facility]) - Initial/Assessment Note    Patient Details  Name: Ebony Scott MRN: [id number] Date of Birth: Sep 11, 1950  [facility] CM/SW Contact:    Kathryn Parish, RN Phone Number: 08/07/2023, 3:53 PM  Clinical Narrative:                 Patient states lives in a house with daughter Leighton Punches; Adult chirldren assit with transport to appointments; PCP/insurance verified; DME - rollator, BSC,[place],WC, Oxygen  continuously, instructed patient to have Robbie to bring travel O2 tank for discharge, No HH; Adult children will transport home at discharge. TOC following progression to discharge.  Expected Discharge Plan: Home/Self Care Barriers to Discharge: Continued Medical Work up   Patient Goals and CMS Choice Patient states their goals for this hospitalization and ongoing recovery are:: Home with Daughter CMS Medicare.gov Compare Post Acute Care list provided to::  (NA) Choice offered to / list presented to : NA [place] ownership interest in [facility].provided to:: Parent NA    Expected Discharge Plan and Services In-house Referral: NA Discharge Planning Services: CM Consult Post Acute Care Choice: NA Living arrangements for the past 2 months: Single Family Home                 DME Arranged: N/A DME Agency: NA       HH Arranged: NA HH Agency: NA        Prior Living Arrangements/Services Living arrangements for the past 2 months: Single Family Home Lives with:: Adult Children Patient language and need for interpreter reviewed:: Yes Do you feel safe going back to the place where you live?: Yes      Need for Family Participation in Patient Care: Yes (Comment) Care giver support system in place?: Yes (comment) Current home services: DME (rollator, oxygen ) Criminal Activity/Legal Involvement Pertinent to Current Situation/Hospitalization: No - Comment as needed  Activities of Daily Living   ADL Screening (condition at time of  admission) Independently performs ADLs?: Yes (appropriate for developmental age) Is the patient deaf or have difficulty hearing?: No Does the patient have difficulty seeing, even when wearing glasses/contacts?: No Does the patient have difficulty concentrating, remembering, or making decisions?: No  Permission Sought/Granted Permission sought to share information with : Case Manager Permission granted to share information with : Yes, Verbal Permission Granted  Share Information with NAME: Leighton Punches and Audrene Blessing     Permission granted to share info w Relationship: children  Permission granted to share info w Contact Information: [phone]  Emotional Assessment Appearance:: Appears stated age Attitude/Demeanor/Rapport: Engaged Affect (typically observed): Appropriate Orientation: : Oriented to Self, Oriented to Place, Oriented to  Time, Oriented to Situation Alcohol  / Substance Use: Not Applicable Psych Involvement: No (comment)  Admission diagnosis:  Community acquired pneumonia [J18.9] AKI (acute kidney injury) (HCC) [N17.9] Community acquired pneumonia, unspecified laterality [J18.9] Patient Active Problem List   Diagnosis Date Noted   Community acquired pneumonia 08/06/2023   Generalized weakness 08/06/2023   AKI (acute kidney injury) (HCC) 08/06/2023   Acute metabolic acidosis 05/20/2023   RLL pneumonia 05/18/2023   Nocturnal hypoxia - 2-3 L/min 05/18/2023   Acute respiratory failure with hypoxia (HCC) 05/18/2023   Current chronic use of systemic steroids - prednisone  7 mg daily for hx of RA 05/18/2023   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   Sepsis without acute organ dysfunction (HCC) 11/30/2022   Fever 09/01/2022   DNR (do not resuscitate) 08/31/2022   Noninfectious otitis externa of left ear 06/08/2021  Iron  deficiency anemia 02/24/2021   GAVE (gastric antral vascular ectasia)    Anticoagulated    Antiplatelet or antithrombotic long-term use    Ischemic congestive  cardiomyopathy (HCC)  01/11/2021   Coronary artery calcification seen on CAT scan 01/11/2021   History of DVT (deep vein thrombosis) 09/24/2020   DMII (diabetes mellitus, type 2) (HCC) 09/23/2020   Hoarseness 09/09/2020   S/P reverse total shoulder arthroplasty, right    DOE (dyspnea on exertion) 02/18/2020   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   S/P reverse total shoulder arthroplasty, left 02/16/2020   History of pulmonary embolus (PE) 12/19/2018   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   Rheumatoid arthritis (HCC)    Bilateral impacted cerumen 08/18/2018   Hypothyroidism 01/14/2018   Depression 01/14/2018   Debility    Protein-calorie malnutrition, moderate (HCC) 12/17/2017   Knee pain    History of breast cancer    Chronic pain syndrome    Fibromyalgia    Graves disease    Dysphonia 08/10/2017   Pulmonary nodules    Chronic kidney disease, stage 3b (HCC) - baseline Scr 1.3-1.5 04/15/2016   Hx of adenomatous colonic polyps 04/12/2016   Opacity of lung on imaging study 11/03/2015   Severe persistent asthma 02/08/2015   Facial pain syndrome 02/08/2015   Insomnia 02/08/2015   Other allergic rhinitis 02/08/2015   LPRD (laryngopharyngeal reflux disease) 02/08/2015   Chronic systolic CHF (congestive heart failure) (HCC) 02/08/2015   Diastolic dysfunction 02/08/2015   Benign paroxysmal positional vertigo 12/03/2012   Disequilibrium 10/29/2012   Pseudogout 02/24/2012   Irritable bowel syndrome 01/02/2010   Hyperlipidemia 12/23/2009   Hyperthyroidism 11/12/2006   Seasonal and perennial allergic rhinitis 11/12/2006   GERD 11/12/2006   NECK PAIN, CHRONIC 11/12/2006   OSTEOPOROSIS 11/12/2006   BREAST CANCER, HX OF 11/12/2006   PCP:  Austine Lefort, MD Pharmacy:   [id number] - [place], [place] - [address] [place] [address] Phone: [phone] Fax: [phone]     Social Drivers of Health (SDOH) Social History: SDOH Screenings    Food Insecurity: No Food Insecurity (08/07/2023)  Housing: Low Risk  (08/07/2023)  Transportation Needs: No Transportation Needs (08/07/2023)  Utilities: Not At Risk (08/07/2023)  Alcohol  Screen: Low Risk  (11/22/2022)  Depression (PHQ2-9): Low Risk  (05/29/2023)  Financial Resource Strain: Low Risk  (03/26/2023)  Physical Activity: Inactive (03/26/2023)  Social Connections: Socially Isolated (08/07/2023)  Stress: Stress Concern Present (03/26/2023)  Tobacco Use: Low Risk  (08/07/2023)  Health Literacy: Adequate Health Literacy (11/22/2022)   SDOH Interventions:     Readmission Risk Interventions    05/20/2023   12:38 PM 12/06/2022    2:19 PM 12/03/2022    3:04 PM  Readmission Risk Prevention Plan  Transportation Screening Complete Complete Complete  PCP or Specialist Appt within 3-5 Days  Complete Complete  HRI or Home Care Consult  Complete Complete  Social Work Consult for Recovery Care Planning/Counseling  Complete Complete  Palliative Care Screening  Complete Complete  Medication Review Oceanographer) Complete Complete Complete  PCP or Specialist appointment within 3-5 days of discharge Complete    HRI or Home Care Consult Complete    SW Recovery Care/Counseling Consult Complete    Palliative Care Screening Not Applicable    Skilled Nursing Facility Complete

## 2023-08-07 NOTE — Evaluation (Signed)
 Physical Therapy Evaluation Patient Details Name: Ebony Scott MRN: [id number] DOB: August 26, 1950 Today's Date: 08/07/2023  History of Present Illness  73 yo female admitted with Pna. Hx of RA-chronic prednision, HF, CAD, chronic resp failure-O2 at night, anemia, chronic pain, fibromyalgia, osteomyelitis, DVT, osteoporosis, breast Ca, CKD, DM  Clinical Impression  On eval, pt required mostly CGA with intermittent mild Min A to steady. She tolerated ambulation distance well. O2 98% on 2L. Will plan to follow during hospital stay. Recommend HHPT.         If plan is discharge home, recommend the following: A little help with walking and/or transfers;A little help with bathing/dressing/bathroom;Assistance with cooking/housework;Assist for transportation;Help with stairs or ramp for entrance   Can travel by private vehicle        Equipment Recommendations None recommended by PT  Recommendations for Other Services       Functional Status Assessment Patient has had a recent decline in their functional status and demonstrates the ability to make significant improvements in function in a reasonable and predictable amount of time.     Precautions / Restrictions Precautions Precautions: Fall Restrictions Weight Bearing Restrictions Per Provider Order: No      Mobility  Bed Mobility Overal bed mobility: Needs Assistance Bed Mobility: Sit to Supine       Sit to supine: Supervision   General bed mobility comments: Supv for lines, safety    Transfers Overall transfer level: Needs assistance Equipment used: Rolling walker (2 wheels) Transfers: Sit to/from Stand Sit to Stand: Contact guard assist           General transfer comment: Cues for safety.    Ambulation/Gait Ambulation/Gait assistance: Contact guard assist, Min assist Gait Distance (Feet): 120 Feet Assistive device: Rolling walker (2 wheels) Gait Pattern/deviations: Step-through pattern, Decreased stride length        General Gait Details: Intermittent assist to steady. O2 98% on 2L  Stairs            Wheelchair Mobility     Tilt Bed    Modified Rankin (Stroke Patients Only)       Balance Overall balance assessment: Needs assistance         Standing balance support: During functional activity, Reliant on assistive device for balance Standing balance-Leahy Scale: Fair                               Pertinent Vitals/Pain Pain Assessment Pain Assessment: Faces Faces Pain Scale: Hurts little more Pain Location: bil feet Pain Descriptors / Indicators: Discomfort Pain Intervention(s): Limited activity within patient's tolerance, Monitored during session    Home Living Family/patient expects to be discharged to:: Private residence Living Arrangements: Children Available Help at Discharge: Family;Available PRN/intermittently Type of Home: House Home Access: Stairs to enter Entrance Stairs-Rails: Right;Left Entrance Stairs-Number of Steps: 6 Alternate Level Stairs-Number of Steps: Pt confirms all information. Home Layout: Two level;Able to live on main level with bedroom/bathroom Home Equipment: Rollator (4 wheels);BSC/3in1;Shower seat;Cane - single point;Cane - quad;Other (comment) (Some type of WC in basement used for pt's mom but pt cannot recall style.) Additional Comments: pts daughter lives in the basement and currently has a knee injury.    Prior Function Prior Level of Function : Needs assist       Physical Assist : ADLs (physical)   ADLs (physical): IADLs Mobility Comments: pt reports using rollator walker in the home and community distances ADLs Comments: independent.  Showers when someone is there. Pt unable to expand on this. Later mentioned pt hires a kid in the neighborhood for shower supervison.  RN to room and assisted with infor from son. Nurse 1x every 2 weeks.  Dtr does laundry. Pt cooks breakfast or warms things up.     Extremity/Trunk  Assessment   Upper Extremity Assessment Upper Extremity Assessment: Defer to OT evaluation RUE Deficits / Details: RA deformites with OA crepedus noted with MMT. WFL ROM but severe weakness of 3-/5 to 3+/5. RA node on D5 which is painful. 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 WFL ROM but severe weakness of 3-/5 to 3+/5. LUE Coordination: decreased fine motor    Lower Extremity Assessment Lower Extremity Assessment: Generalized weakness       Communication   Communication Communication: No apparent difficulties    Cognition Arousal: Alert Behavior During Therapy: WFL for tasks assessed/performed   PT - Cognitive impairments: No apparent impairments                         Following commands: Intact       Cueing Cueing Techniques: Verbal cues     General Comments      Exercises     Assessment/Plan    PT Assessment Patient needs continued PT services  PT Problem List Decreased strength;Decreased range of motion;Decreased activity tolerance;Decreased balance;Decreased knowledge of use of DME;Decreased mobility       PT Treatment Interventions DME instruction;Gait training;Functional mobility training;Therapeutic activities;Therapeutic exercise;Patient/family education;Balance training    PT Goals (Current goals can be found in the Care Plan section)  Acute Rehab PT Goals Patient Stated Goal: none stated PT Goal Formulation: With patient Time For Goal Achievement: 08/21/23 Potential to Achieve Goals: Good    Frequency Min 3X/week     Co-evaluation               AM-PAC PT 6 Clicks Mobility  Outcome Measure Help needed turning from your back to your side while in a flat bed without using bedrails?: None Help needed moving from lying on your back to sitting on the side of a flat bed without using bedrails?: None Help needed moving to and from a bed to a chair (including a  wheelchair)?: A Little Help needed standing up from a chair using your arms (e.g., wheelchair or bedside chair)?: A Little Help needed to walk in hospital room?: A Little Help needed climbing 3-5 steps with a railing? : A Little 6 Click Score: 20    End of Session Equipment Utilized During Treatment: Oxygen  Activity Tolerance: Patient tolerated treatment well Patient left: in bed;with call bell/phone within reach;with bed alarm set;with family/visitor present   PT Visit Diagnosis: Difficulty in walking, not elsewhere classified (R26.2);Muscle weakness (generalized) (M62.81);Unsteadiness on feet (R26.81)    Time: 4098-1191 PT Time Calculation (min) (ACUTE ONLY): 15 min   Charges:   PT Evaluation $PT Eval Low Complexity: 1 Low   PT General Charges $$ ACUTE PT VISIT: 1 Visit            Tanda Falter, PT Acute Rehabilitation  Office: [phone]

## 2023-08-08 ENCOUNTER — Inpatient Hospital Stay (HOSPITAL_COMMUNITY)

## 2023-08-08 DIAGNOSIS — J470 Bronchiectasis with acute lower respiratory infection: Secondary | ICD-10-CM | POA: Diagnosis not present

## 2023-08-08 DIAGNOSIS — R531 Weakness: Secondary | ICD-10-CM | POA: Diagnosis not present

## 2023-08-08 DIAGNOSIS — R7881 Bacteremia: Secondary | ICD-10-CM | POA: Diagnosis not present

## 2023-08-08 DIAGNOSIS — I38 Endocarditis, valve unspecified: Secondary | ICD-10-CM

## 2023-08-08 DIAGNOSIS — B952 Enterococcus as the cause of diseases classified elsewhere: Secondary | ICD-10-CM | POA: Diagnosis not present

## 2023-08-08 DIAGNOSIS — N179 Acute kidney failure, unspecified: Secondary | ICD-10-CM | POA: Diagnosis not present

## 2023-08-08 DIAGNOSIS — N1832 Chronic kidney disease, stage 3b: Secondary | ICD-10-CM | POA: Diagnosis not present

## 2023-08-08 DIAGNOSIS — I5020 Unspecified systolic (congestive) heart failure: Secondary | ICD-10-CM | POA: Diagnosis not present

## 2023-08-08 DIAGNOSIS — A498 Other bacterial infections of unspecified site: Secondary | ICD-10-CM | POA: Insufficient documentation

## 2023-08-08 DIAGNOSIS — E119 Type 2 diabetes mellitus without complications: Secondary | ICD-10-CM | POA: Diagnosis not present

## 2023-08-08 DIAGNOSIS — J189 Pneumonia, unspecified organism: Secondary | ICD-10-CM | POA: Diagnosis not present

## 2023-08-08 LAB — ECHOCARDIOGRAM COMPLETE
AR max vel: 0.76 cm2
AV Area VTI: 0.75 cm2
AV Area mean vel: 0.73 cm2
AV Mean grad: 12 mmHg
AV Peak grad: 20.8 mmHg
Ao pk vel: 2.28 m/s
Area-P 1/2: 3.12 cm2
MV M vel: 4.02 m/s
MV Peak grad: 64.6 mmHg
P 1/2 time: 460 ms
S' Lateral: 3.05 cm

## 2023-08-08 LAB — GLUCOSE, CAPILLARY
Glucose-Capillary: 176 mg/dL — ABNORMAL HIGH (ref 70–99)
Glucose-Capillary: 243 mg/dL — ABNORMAL HIGH (ref 70–99)
Glucose-Capillary: 156 mg/dL — ABNORMAL HIGH (ref 70–99)
Glucose-Capillary: 164 mg/dL — ABNORMAL HIGH (ref 70–99)

## 2023-08-08 LAB — BASIC METABOLIC PANEL WITH GFR
Anion gap: 9 (ref 5–15)
BUN: 30 mg/dL — ABNORMAL HIGH (ref 8–23)
CO2: 19 mmol/L — ABNORMAL LOW (ref 22–32)
Calcium: 8.1 mg/dL — ABNORMAL LOW (ref 8.9–10.3)
Chloride: 110 mmol/L (ref 98–111)
Creatinine, Ser: 1.29 mg/dL — ABNORMAL HIGH (ref 0.44–1.00)
GFR, Estimated: 43 mL/min — ABNORMAL LOW (ref >60)
Glucose, Bld: 209 mg/dL — ABNORMAL HIGH (ref 70–99)
Potassium: 3.6 mmol/L (ref 3.5–5.1)
Sodium: 138 mmol/L (ref 135–145)

## 2023-08-08 MED ORDER — SODIUM CHLORIDE 0.9 % IV SOLN
2.0000 g | Freq: Two times a day (BID) | INTRAVENOUS | Status: DC
  Administered 2023-08-08 – 2023-08-22 (×28): 2 g via INTRAVENOUS
  Filled 2023-08-08 (×28): qty 20

## 2023-08-08 MED ORDER — PREDNISONE 1 MG PO TABS
2.0000 mg | ORAL_TABLET | Freq: Every day | ORAL | Status: DC
  Administered 2023-08-08 – 2023-08-10 (×3): 2 mg via ORAL
  Filled 2023-08-08 (×5): qty 2

## 2023-08-08 MED ORDER — PERFLUTREN LIPID MICROSPHERE
1.0000 mL | INTRAVENOUS | Status: AC | PRN
  Administered 2023-08-08: 3 mL via INTRAVENOUS

## 2023-08-08 MED ORDER — VANCOMYCIN HCL 1250 MG/250ML IV SOLN
1250.0000 mg | INTRAVENOUS | Status: DC
  Administered 2023-08-08: 1250 mg via INTRAVENOUS
  Filled 2023-08-08: qty 250

## 2023-08-08 MED ORDER — SODIUM CHLORIDE 0.9 % IV SOLN
2.0000 g | Freq: Four times a day (QID) | INTRAVENOUS | Status: DC
  Administered 2023-08-08 – 2023-08-12 (×15): 2 g via INTRAVENOUS
  Filled 2023-08-08 (×18): qty 2000

## 2023-08-08 MED ORDER — PREDNISONE 5 MG PO TABS
5.0000 mg | ORAL_TABLET | Freq: Every day | ORAL | Status: DC
  Administered 2023-08-08 – 2023-08-15 (×8): 5 mg via ORAL
  Filled 2023-08-08 (×8): qty 1

## 2023-08-08 NOTE — Plan of Care (Signed)
  Problem: Education: Goal: Knowledge of General Education information will improve Description: Including pain rating scale, medication(s)/side effects and non-pharmacologic comfort measures Outcome: Progressing   Problem: Health Behavior/Discharge Planning: Goal: Ability to manage health-related needs will improve Outcome: Progressing   Problem: Clinical Measurements: Goal: Ability to maintain clinical measurements within normal limits will improve Outcome: Progressing Goal: Will remain free from infection Outcome: Progressing Goal: Diagnostic test results will improve Outcome: Progressing Goal: Respiratory complications will improve Outcome: Progressing Goal: Cardiovascular complication will be avoided Outcome: Progressing   Problem: Activity: Goal: Risk for activity intolerance will decrease Outcome: Progressing   Problem: Nutrition: Goal: Adequate nutrition will be maintained Outcome: Progressing   Problem: Coping: Goal: Level of anxiety will decrease Outcome: Progressing   Problem: Elimination: Goal: Will not experience complications related to bowel motility Outcome: Progressing Goal: Will not experience complications related to urinary retention Outcome: Progressing   Problem: Pain Managment: Goal: General experience of comfort will improve and/or be controlled Outcome: Progressing   Problem: Safety: Goal: Ability to remain free from injury will improve Outcome: Progressing   Problem: Skin Integrity: Goal: Risk for impaired skin integrity will decrease Outcome: Progressing   Problem: Activity: Goal: Ability to tolerate increased activity will improve Outcome: Progressing   Problem: Clinical Measurements: Goal: Ability to maintain a body temperature in the normal range will improve Outcome: Progressing   Problem: Respiratory: Goal: Ability to maintain adequate ventilation will improve Outcome: Progressing Goal: Ability to maintain a clear airway  will improve Outcome: Progressing   Problem: Education: Goal: Ability to describe self-care measures that may prevent or decrease complications (Diabetes Survival Skills Education) will improve Outcome: Progressing Goal: Individualized Educational Video(s) Outcome: Progressing   Problem: Coping: Goal: Ability to adjust to condition or change in health will improve Outcome: Progressing   Problem: Fluid Volume: Goal: Ability to maintain a balanced intake and output will improve Outcome: Progressing   Problem: Health Behavior/Discharge Planning: Goal: Ability to identify and utilize available resources and services will improve Outcome: Progressing Goal: Ability to manage health-related needs will improve Outcome: Progressing   Problem: Metabolic: Goal: Ability to maintain appropriate glucose levels will improve Outcome: Progressing   Problem: Nutritional: Goal: Maintenance of adequate nutrition will improve Outcome: Progressing Goal: Progress toward achieving an optimal weight will improve Outcome: Progressing   Problem: Skin Integrity: Goal: Risk for impaired skin integrity will decrease Outcome: Progressing   Problem: Tissue Perfusion: Goal: Adequacy of tissue perfusion will improve Outcome: Progressing

## 2023-08-08 NOTE — Consult Note (Signed)
 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  No recent GI procedures.  Possible diarrhea a few days ago.  No abdominal pain or urinary symptoms.  UA was benign. - Following arrival she had a temp of 103, WBC 7.3.  Blood cultures returned positive for 2/2 sets E faecalis. -CT scan without contrast showed new areas of tree-in-bud peribronchial nodularity with peripheral consolidation medial left upper lobe keeping with acute infectious or inflammatory process.,  Improvement of right lower lobe consolidation.[name]  #Reported history of penicillin allergy  with rash in 2014.  Tolerated Ancef  in 2021 - Patient is amenable to ampicillin  Challenge  Recommendations:  -Amp challenge - Complete azithromycin  x 3 days -continue vancomycin  and ctx for now -Repeat blood Cx -tte, will need tee -Engage EP given ICD in place and would recommend extraction. Communicated with primary  Evaluation of this patient requires complex antimicrobial therapy evaluation and counseling + isolation needs for disease transmission risk assessment and mitigation     Microbiology:   Antibiotics:  Azihtromycin 6/12- Ctx 6/17- Vancomycin  6/18- Cultures: Blood 6/172/2 efaecalis 1/2 staph epi Urine  Other   HPI: Ebony Scott is a 73 y.o. female with history of seropositive rheumatoid arthritis on prednisone  7 mg, chronic respiratory failure on 2 L at night, bronchiectasis, heart failure reduced ejection fraction status post ICD in 2024 CAD status post PCI, hyper hemoglobinemia requiring Hizentra , CKD stage III, chronic pain syndrome, GERD, anemia, tracheal bronchomalacia, prior history of DVT/PE on Xarelto  presented with generalized weakness, fever and fatigue.  Patient states that she had passed out.  On arrival her white cell count was 9K.  Temp 99 degrees.  UA benign.  Chest x-ray showed right reticular nodular opacity laterally in the right upper lobe possibly at atelectasis or atypical infection.  Started on ceftriaxone  azithromycin .  ID engaged as blood cultures grew E faecalis.    Review of Systems: Review of Systems  All other systems reviewed and are negative.   Past Medical History:  Diagnosis Date   Anemia    Angio-edema    Breast cancer (HCC) 1998   Left breast, in remission   Bronchiectasis (HCC)    CHF (congestive heart failure) (HCC)    Closed displaced fracture of metatarsal bone of right foot 01/20/2019   Closed fracture of fifth metatarsal bone of right foot, initial encounter 01/15/2018   Clostridium difficile colitis 01/14/2018   Coronary artery disease    Depression    some; don't take anything for it (02/20/2012), pt denies dx as of 06/02/21   Diverticulosis    DVT (deep venous  thrombosis) (HCC)    Fibromyalgia 11/2011   Fracture of base of fifth metatarsal bone with routine healing, left    GAVE (gastric antral vascular ectasia)    GERD (gastroesophageal reflux disease)    Headache(784.0)    related to allergies; more at different times during the year (02/20/2012)   Hemorrhoids    Hiatal hernia    back and  neck   History of blood transfusion 2023   1 unit, 5 units of Iron    Hx of adenomatous colonic polyps 04/12/2016   Hypercholesteremia    good cholesterol is high   Hypothyroidism    h/o Graves disease   IBS (irritable bowel syndrome)    Moderate persistent asthma    -FeV1 72% 2011, -IgE 102 2011, CT sinus Neg 2011   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 Cindy Scott   Sleep apnea    uses cpap nightly   Tracheobronchomalacia    Vasculitis (HCC) 12/13/2017    Social History   Tobacco Use   Smoking status: Never    Passive exposure: Past   Smokeless tobacco: Never   Tobacco comments:    Parents  Vaping Use   Vaping status: Never Used  Substance Use Topics   Alcohol  use: Not Currently    Alcohol /week: 0.0 standard drinks of alcohol    Drug use: No    Family History  Problem Relation Age of Onset   Allergies Mother    Heart disease Mother    Arthritis Mother    Lung cancer Mother        heavy smoker   Diabetes Mother    Allergies Father    Heart disease Father    Arthritis Father    Stroke Father    Heart disease Brother    Diabetes Maternal Grandfather    Colitis Daughter    Colon cancer Other        Maternal half uncle   Scheduled Meds:  acidophilus  1 capsule Oral QPM   atorvastatin   80 mg Oral QPM   azithromycin   500 mg Oral Daily   budesonide -glycopyrrolate -formoterol   2 puff Inhalation BID   cholecalciferol   1,000 Units Oral QHS   clopidogrel   75 mg Oral Daily   empagliflozin   10 mg Oral Daily   fluticasone   2 spray Each Nare Daily   folic acid   1 mg Oral Daily   furosemide   40 mg Oral Once per day on Monday Thursday   gabapentin   300 mg Oral QHS   guaiFENesin   600 mg Oral BID   insulin  aspart  0-5 Units Subcutaneous QHS   insulin   aspart  0-9 Units Subcutaneous TID WC   isosorbide -hydrALAZINE   1 tablet Oral BID   pantoprazole   40 mg Oral Daily   predniSONE   2 mg Oral Q breakfast   predniSONE   5 mg Oral Q breakfast   rivaroxaban   20 mg Oral Q supper   sacubitril -valsartan   1 tablet Oral BID   Continuous Infusions:  ampicillin  (OMNIPEN) IV     cefTRIAXone  (ROCEPHIN )  IV     PRN Meds:.acetaminophen  **OR** acetaminophen , ipratropium-albuterol , ondansetron  **OR** ondansetron  (ZOFRAN ) IV, perflutren  lipid microspheres (DEFINITY ) IV suspension, senna-docusate Allergies  Allergen Reactions   Baclofen  Anaphylaxis and Other (See Comments)    Altered  mental status requiring a 3-day hospital stay- patient became unresponsive   Put me in a coma for five days, Altered mental status requiring a 3-day hospital stay- patient became unresponsive   Other Shortness Of Breath and Other (See Comments)    Grass and weeds sneezing; filled sinuses (02/20/2012)   Pork-Derived Products Anaphylaxis   Shellfish Allergy  Anaphylaxis, Shortness Of Breath, Itching and Rash   Shrimp Extract Anaphylaxis and Other (See Comments)    ALL SHELLFISH   Tetracycline Hcl Nausea And Vomiting   Xolair  [Omalizumab ] Other (See Comments)    Caused Blood clot   Zoledronic  Acid Other (See Comments)    Reclast - Fever, Put in hospital, dr said it was a reaction from a reaction    Fever, inability to walk, Put in hospital  Fever, Put in hospital, dr said it was a reaction from a reaction , Other reaction(s): Unknown   Celecoxib Swelling    Feet swelling   Dilaudid  [Hydromorphone  Hcl] Itching   Hydrocodone -Acetaminophen  Itching   Levofloxacin  Other (See Comments)    GI upset   Molds & Smuts Cough   Morphine  And Codeine Hives and Itching   Oxycodone  Hcl Nausea And Vomiting    GI Intolerance   Paroxetine Nausea And Vomiting and Other (See Comments)    GI Intolerance also   Penicillins Rash and Other (See Comments)    welts  (02/20/2012)  Tolerated Ancef  02/16/20   Tetracycline Hcl Other (See Comments)    GI Intolerance   Oxycodone -Acetaminophen  Itching   Diltiazem  Hcl Swelling   Dust Mite Extract Other (See Comments) and Cough    sneezing (02/20/2012) too   Gamunex [Immune Globulin ] Itching and Rash    Tolerates hizentra     Penicillin G Procaine Rash and Other (See Comments)    Welts, also   Rituximab  Other (See Comments)    Anemia    Rofecoxib Swelling    Vioxx- feet swelling   Tree Extract Itching    tested and told I was allergic to it; never experienced a reaction to it (02/20/2012)    OBJECTIVE: Blood pressure 125/67, pulse 60, temperature 98.3 F (36.8 C), temperature source Oral, resp. rate 18, height 5' 2 (1.575 m), weight 59 kg, SpO2 96%.  Physical Exam Constitutional:      Appearance: Normal appearance.  HENT:     Head: Normocephalic and atraumatic.     Right Ear: Tympanic membrane normal.     Left Ear: Tympanic membrane normal.     Nose: Nose normal.     Mouth/Throat:     Mouth: Mucous membranes are moist.   Eyes:     Extraocular Movements: Extraocular movements intact.     Conjunctiva/sclera: Conjunctivae normal.     Pupils: Pupils are equal, round, and reactive to light.    Cardiovascular:     Rate and Rhythm: Normal rate and regular rhythm.     Heart sounds: No murmur heard.    No friction rub. No gallop.  Pulmonary:     Effort: Pulmonary effort is normal.     Breath sounds: Normal breath sounds.  Abdominal:     General: Abdomen is flat.     Palpations: Abdomen is soft.   Musculoskeletal:        General: Normal range of motion.   Skin:    General: Skin is warm and dry.   Neurological:     General: No focal deficit present.     Mental Status: She is alert and oriented to person, place,  and time.   Psychiatric:        Mood and Affect: Mood normal.     Lab Results Lab Results  Component Value Date   WBC 7.3 08/07/2023   HGB 10.1 (L) 08/07/2023   HCT 32.5  (L) 08/07/2023   MCV 105.5 (H) 08/07/2023   PLT 113 (L) 08/07/2023    Lab Results  Component Value Date   CREATININE 1.29 (H) 08/08/2023   BUN 30 (H) 08/08/2023   NA 138 08/08/2023   K 3.6 08/08/2023   CL 110 08/08/2023   CO2 19 (L) 08/08/2023    Lab Results  Component Value Date   ALT 26 08/06/2023   AST 37 08/06/2023   ALKPHOS 40 08/06/2023   BILITOT 0.9 08/06/2023       Loney Stank, MD [facility] 08/08/2023, 4:24 PM

## 2023-08-08 NOTE — Progress Notes (Signed)
 Pharmacy Antibiotic Note  Ebony Scott is a 73 y.o. female admitted on 08/06/2023 with Enterococcus bacteremia. Pharmacy has been consulted for Vancomycin  dosing.   Day #2 of of abx. SCr improving down to 1.29.  Plan: Change vancomycin  to 1250mg  IV Q48 Monitor clinical picture, renal function, vanc level prn F/U C&S, abx deescalation / LOT  Height: 5' 2 (157.5 cm) Weight: 59 kg (130 lb 1.1 oz) IBW/kg (Calculated) : 50.1  Temp (24hrs), Avg:98.2 F (36.8 C), Min:98.1 F (36.7 C), Max:98.3 F (36.8 C)  Recent Labs  Lab 08/06/23 1611 08/06/23 1622 08/07/23 0430 08/08/23 0838  WBC 9.0  --  7.3  --   CREATININE 2.14*  --  1.60* 1.29*  LATICACIDVEN  --  1.3  --   --     Estimated Creatinine Clearance: 29.8 mL/min (A) (by C-G formula based on SCr of 1.29 mg/dL (H)).    Allergies  Allergen Reactions   Baclofen  Anaphylaxis and Other (See Comments)    Altered mental status requiring a 3-day hospital stay- patient became unresponsive   Put me in a coma for five days, Altered mental status requiring a 3-day hospital stay- patient became unresponsive   Other Shortness Of Breath and Other (See Comments)    Grass and weeds sneezing; filled sinuses (02/20/2012)   Pork-Derived Products Anaphylaxis   Shellfish Allergy  Anaphylaxis, Shortness Of Breath, Itching and Rash   Shrimp Extract Anaphylaxis and Other (See Comments)    ALL SHELLFISH   Tetracycline Hcl Nausea And Vomiting   Xolair  [Omalizumab ] Other (See Comments)    Caused Blood clot   Zoledronic  Acid Other (See Comments)    Reclast - Fever, Put in hospital, dr said it was a reaction from a reaction    Fever, inability to walk, Put in hospital  Fever, Put in hospital, dr said it was a reaction from a reaction , Other reaction(s): Unknown   Celecoxib Swelling    Feet swelling   Dilaudid  [Hydromorphone  Hcl] Itching   Hydrocodone -Acetaminophen  Itching   Levofloxacin  Other (See Comments)    GI upset   Molds & Smuts Cough    Morphine  And Codeine Hives and Itching   Oxycodone  Hcl Nausea And Vomiting    GI Intolerance   Paroxetine Nausea And Vomiting and Other (See Comments)    GI Intolerance also   Penicillins Rash and Other (See Comments)    welts (02/20/2012)  Tolerated Ancef  02/16/20   Tetracycline Hcl Other (See Comments)    GI Intolerance   Oxycodone -Acetaminophen  Itching   Diltiazem  Hcl Swelling   Dust Mite Extract Other (See Comments) and Cough    sneezing (02/20/2012) too   Gamunex [Immune Globulin ] Itching and Rash    Tolerates hizentra     Penicillin G Procaine Rash and Other (See Comments)    Welts, also   Rituximab  Other (See Comments)    Anemia    Rofecoxib Swelling    Vioxx- feet swelling   Tree Extract Itching    tested and told I was allergic to it; never experienced a reaction to it (02/20/2012)   Microbiology results: 08/06/23 BCx: Enterococcus Faecalis 08/06/23 MRSA PCR: Negative   Thank you for allowing pharmacy to be a part of this patient's care.  Court Distance, PharmD, BCPS, BCIDP Clinical Pharmacist 08/08/2023 11:47 AM

## 2023-08-08 NOTE — Progress Notes (Signed)
 Echocardiogram 2D Echocardiogram has been performed.  Farley Honer, RDCS 08/08/2023, 3:48 PM

## 2023-08-08 NOTE — Progress Notes (Signed)
 NAME:  Ebony Scott, MRN:  [id number], DOB:  1950-10-10, LOS: 1 ADMISSION DATE:  08/06/2023, CONSULTATION DATE: 08/07/2023 REFERRING MD: Dr. Butch Cashing, CHIEF COMPLAINT:  Cough, shortness of breath, altered mental status    History of Present Illness:  Patient with a known history of bronchiectasis, came in with worsening shortness of breath, cough, altered mental status, fevers of 103 was last hospitalized about March   Was last hospitalized about March for pneumonia Has had recurrent pneumonias over the years History of bronchiectasis, asthma, chronic respiratory failure-on 2 L of oxygen  at night, obstructive sleep apnea/on CPAP, history of tracheobronchomalacia History of 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  therapy, 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, chronic kidney disease stage IIIb,   She had had a CT scan in March 2025 with extensive infiltrative process, a repeat about 10 days ago showed improvement   Chest x-ray during this hospitalization does show what appears to be a new right upper lobe infiltrate  Pertinent  Medical History   Past Medical History:  Diagnosis Date   Anemia    Angio-edema    Breast cancer (HCC) 1998   Left breast, in remission   Bronchiectasis (HCC)    CHF (congestive heart failure) (HCC)    Closed displaced fracture of metatarsal bone of right foot 01/20/2019   Closed fracture of fifth metatarsal bone of right foot, initial encounter 01/15/2018   Clostridium difficile colitis 01/14/2018   Coronary artery disease    Depression    some; don't take anything for it (02/20/2012), pt denies dx as of 06/02/21   Diverticulosis    DVT (deep venous thrombosis) (HCC)    Fibromyalgia 11/2011   Fracture of base of fifth metatarsal bone with routine healing, left    GAVE (gastric antral vascular ectasia)    GERD (gastroesophageal reflux disease)    Headache(784.0)    related to allergies; more at different times during the year  (02/20/2012)   Hemorrhoids    Hiatal hernia    back and neck   History of blood transfusion 2023   1 unit, 5 units of Iron    Hx of adenomatous colonic polyps 04/12/2016   Hypercholesteremia    good cholesterol is high   Hypothyroidism    h/o Graves disease   IBS (irritable bowel syndrome)    Moderate persistent asthma    -FeV1 72% 2011, -IgE 102 2011, CT sinus Neg 2011   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 Larrie Po   Sleep apnea    uses cpap nightly   Tracheobronchomalacia    Vasculitis (HCC) 12/13/2017   Significant Hospital Events: Including procedures, antibiotic start and stop dates in addition to other pertinent events   07/31/2023-CT scan chest-significant improvement in multifocal infiltrate 6/17-chest x-ray showing a new right upper lobe infiltrate  Interim History / Subjective:  No new issues Mild cough No chest discomfort  Objective    Blood pressure 119/63, pulse (!) 58, temperature 98.1 F (36.7 C), temperature source Oral, resp. rate 17, height 5' 2 (1.575 m), weight 59 kg, SpO2 96%.        Intake/Output Summary (Last 24 hours) at 08/08/2023 0742 Last data filed at 08/07/2023 1810 Gross per 24 hour  Intake 680 ml  Output 400 ml  Net 280  ml   Filed Weights   08/07/23 2300  Weight: 59 kg    Examination: General: elderly, not in distress HENT: moist oral mucosa Lungs: decreased air movement bilaterally Cardiovascular: S1 S2 appreciated Abdomen: soft, bowel sounds appreciated  I reviewed last 24 h vitals and pain scores, last 48 h intake and output, last 24 h labs and trends, and last 24 h imaging results.  Resolved problem list   Assessment and Plan   Community-acquired pneumonia Enterococcus faecalis bacteremia  History of  bronchiectasis -focus on sputum clearance  HFrEF -was on GDMT at home  Chronic steroid dependence  History of PE/DVT - On chronic anticoagulation  Hypogammaglobulinemia Chronic pain syndrome Type 2 diabetes  Discontinue stress dose steroids - Placed back on home steroids -on prednisone  7mg  daily

## 2023-08-08 NOTE — Progress Notes (Signed)
 Physical Therapy Treatment Patient Details Name: Ebony Scott MRN: [id number] DOB: 17-Feb-1951 Today's Date: 08/08/2023   History of Present Illness 73 yo female admitted with Pna. Hx of RA-chronic prednision, HF, CAD, chronic resp failure-O2 at night, anemia, chronic pain, fibromyalgia, osteomyelitis, DVT, osteoporosis, breast Ca, CKD, DM    PT Comments  Pt in bed on arrival and agreeable to ambulate as tolerated.  Pt only wears oxygen  at night at baseline so removed [place] for session.  Pt ambulated on room air and SPO2 96% and 97%.  Pt assisted to bathroom after ambulating in hallway and then agreeable to remain OOB in recliner end of session.     If plan is discharge home, recommend the following: A little help with walking and/or transfers;A little help with bathing/dressing/bathroom;Assistance with cooking/housework;Assist for transportation;Help with stairs or ramp for entrance   Can travel by private vehicle        Equipment Recommendations  None recommended by PT    Recommendations for Other Services       Precautions / Restrictions Precautions Precautions: Fall Precaution/Restrictions Comments: oxygen  at night only at baseline     Mobility  Bed Mobility Overal bed mobility: Needs Assistance Bed Mobility: Supine to Sit     Supine to sit: Supervision, HOB elevated          Transfers Overall transfer level: Needs assistance Equipment used: Rolling walker (2 wheels) Transfers: Sit to/from Stand Sit to Stand: Contact guard assist           General transfer comment: verbal cues for hand placement to self assist    Ambulation/Gait Ambulation/Gait assistance: Contact guard assist, Min assist Gait Distance (Feet): 80 Feet Assistive device: Rolling walker (2 wheels) Gait Pattern/deviations: Step-through pattern, Decreased stride length Gait velocity: decr     General Gait Details: pt reports generalized weakness in LEs, distance to tolerance, ambulated on room  air and SPO2 96% upon return to room and then 97% on room air when using bathroom prior to ambulating over to recliner   Stairs             Wheelchair Mobility     Tilt Bed    Modified Rankin (Stroke Patients Only)       Balance           Standing balance support: No upper extremity supported Standing balance-Leahy Scale: Fair Standing balance comment: washed hands at sink, utilizing RW for mobility                            Communication Communication Communication: No apparent difficulties  Cognition Arousal: Alert Behavior During Therapy: WFL for tasks assessed/performed   PT - Cognitive impairments: No apparent impairments                         Following commands: Intact      Cueing Cueing Techniques: Verbal cues  Exercises      General Comments        Pertinent Vitals/Pain Pain Assessment Pain Assessment: No/denies pain Pain Intervention(s): Monitored during session, Repositioned    Home Living                          Prior Function            PT Goals (current goals can now be found in the care plan section) Progress towards PT goals: Progressing toward  goals    Frequency    Min 3X/week      PT Plan      Co-evaluation              AM-PAC PT 6 Clicks Mobility   Outcome Measure  Help needed turning from your back to your side while in a flat bed without using bedrails?: None Help needed moving from lying on your back to sitting on the side of a flat bed without using bedrails?: None Help needed moving to and from a bed to a chair (including a wheelchair)?: A Little Help needed standing up from a chair using your arms (e.g., wheelchair or bedside chair)?: A Little Help needed to walk in hospital room?: A Little Help needed climbing 3-5 steps with a railing? : A Little 6 Click Score: 20    End of Session Equipment Utilized During Treatment: Gait belt Activity Tolerance: Patient  tolerated treatment well Patient left: in chair;with call bell/phone within reach Nurse Communication: Mobility status PT Visit Diagnosis: Difficulty in walking, not elsewhere classified (R26.2);Muscle weakness (generalized) (M62.81)     Time: 5409-8119 PT Time Calculation (min) (ACUTE ONLY): 18 min  Charges:    $Gait Training: 8-22 mins PT General Charges $$ ACUTE PT VISIT: 1 Visit                    Ebony Scott PT, DPT Physical Therapist Acute Rehabilitation Services Office: [phone]    Myna Asal Payson 08/08/2023, 5:02 PM

## 2023-08-08 NOTE — Progress Notes (Signed)
 Progress Note   Patient: Ebony Scott DOB: November 07, 1950 DOA: 08/06/2023     1 DOS: the patient was seen and examined on 08/08/2023   Brief hospital course:  Ebony Scott is a 73 y.o. female with medical history significant for seropositive rheumatoid arthritis on chronic prednisone  (7 mg daily), chronic respiratory failure on 2L [place] at night, asthma, bronchiectasis, HFrEF s/p ICD implant in 2024, CAD s/p PCI, hypogammaglobulinemia on IVIG infusions, CKD stage IIIb (baseline sCr 1.3-1.5), chronic pain syndrome, GAVE, GERD, anemia, history of tracheobronchomalacia, prior hx of PE/DVT on Xarelto  who presents to the ED via EMS for evaluation of generalized weakness, fever, fatigue and cough. She is admitted for community acquired pneumonia management. Seen by pulmonologist.   Assessment and Plan: Sepsis due to Community-acquired pneumonia Recurrent pneumonias Enterococcus bacteremia History of immunosuppression s/p recurrent pneumonias presented with generalized weakness, productive cough and fever Chest imaging shows reticulonodular opacity laterally in the right upper lobe, possibly from atelectasis or atypical infectious bronchiolitis She presented with fever, tachycardia, lactic acid  normal. Blood cultures positive for enterococcus fecalis. ID on board. Rocephin  to be changed ampicillin (challenge dose as she has penicillin allergy  per pharmacy) . Continue azithromycin . MRSA screen negative, stop Vanco. Pulmonology consult appreciated, advised to continue current antibiotic regimen. Mucinex  DM twice daily Encourage incentive spirometry, out of bed to chair. Supplemental O2 as needed.   AKI on CKD 3B Creatinine elevated to 2.14 improved to  baseline of 1.3-1.5 with IV hydration. Renal dysfunction secondary to hypovolemia in the setting of decreased p.o. intake Avoid nephrotoxic meds.   Chronic hypoxic respiratory failure Bronchiectasis Asthma At baseline patient uses 2-3 L [place]  at night Currently on 2L at rest. Continue home bronchodilators Continue as needed DuoNebs. Pulmonology consult appreciated.   HFrEF Last TTE in May 2024 showed EF 30-35%, moderate LV hypokinesis, mild LVH, G1 DD, moderately dilated LA. Resumed GDMT.  Seropositive RA Chronic steroid use Restarted home dose 7 mg of prednisone  daily after she got stress dose steroids. Scheduled to follow-up with rheumatology on 6/25   Hypogammaglobulinemia IgG, IgA and IgM levels within normal limit 2 months ago Continue IVIG as outpatient   Type 2 diabetes mellitus Last A1c 6.0% 8 months ago Continue sliding scale with insulin  given steroid use.  Hx of PE/DVT Chronic and stable Continue Xarelto    Chronic pain syndrome Resumed    Generalized weakness In the setting of acute illness. PT/OT eval and follow up.      Out of bed to chair. Incentive spirometry. Nursing supportive care. Fall, aspiration precautions. Diet:  Diet Orders (From admission, onward)     Start     Ordered   08/06/23 1941  Diet Heart Room service appropriate? Yes; Fluid consistency: Thin  Diet effective now       Question Answer Comment  Room service appropriate? Yes   Fluid consistency: Thin      08/06/23 1941           DVT prophylaxis:  rivaroxaban  (XARELTO ) tablet 20 mg  Level of care: Med-Surg   Code Status: Limited: Do not attempt resuscitation (DNR) -DNR-LIMITED -Do Not Intubate/DNI   Subjective: Patient is seen and examined today morning. She is lying comfortably, continues to be on 2L supplemental o2. Eating fair. Advised to work with PT.  Physical Exam: Vitals:   08/07/23 2300 08/08/23 0524 08/08/23 0740 08/08/23 1200  BP:  119/63  125/67  Pulse:  (!) 58  60  Resp:  17  18  Temp:  98.1 F (36.7 C)  98.3 F (36.8 C)  TempSrc:  Oral  Oral  SpO2:  99% 96%   Weight: 59 kg     Height: 5' 2 (1.575 m)       General - Elderly Caucasian weak female, no apparent distress HEENT - PERRLA, EOMI,  atraumatic head, non tender sinuses. Lung - Clear, diffuse rales, rhonchi, wheezes. Heart - S1, S2 heard, no murmurs, rubs, trace pedal edema. Abdomen - Soft, non tender, bowel sounds good Neuro - Alert, awake and oriented x 3, non focal exam. Skin - Warm and dry.  Data Reviewed:      Latest Ref Rng & Units 08/07/2023    4:30 AM 08/06/2023    4:11 PM 06/03/2023   10:29 AM  CBC  WBC 4.0 - 10.5 K/uL 7.3  9.0  8.3   Hemoglobin 12.0 - 15.0 g/dL 09.8  9.7  9.4   Hematocrit 36.0 - 46.0 % 32.5  31.5  28.5   Platelets 150 - 400 K/uL 113  140  187       Latest Ref Rng & Units 08/08/2023    8:38 AM 08/07/2023    4:30 AM 08/06/2023    4:11 PM  BMP  Glucose 70 - 99 mg/dL 119  147  829   BUN 8 - 23 mg/dL 30  30  37   Creatinine 0.44 - 1.00 mg/dL 5.62  1.30  8.65   Sodium 135 - 145 mmol/L 138  138  141   Potassium 3.5 - 5.1 mmol/L 3.6  4.1  4.1   Chloride 98 - 111 mmol/L 110  108  107   CO2 22 - 32 mmol/L 19  20  24    Calcium  8.9 - 10.3 mg/dL 8.1  8.5  9.1    DG Chest Port 1 View Result Date: 08/06/2023 CLINICAL DATA:  Sepsis. Fatigue and weakness. Altered mental status. Fever. Productive cough. EXAM: PORTABLE CHEST 1 VIEW COMPARISON:  05/18/2023 FINDINGS: AICD noted. Wrist right shoulder arthroplasty. Postoperative findings in the cervical spine. Atherosclerotic calcification of the aortic arch. Heart size within normal limits. 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 Reticulonodular opacity laterally in the right upper lobe, possibly from atelectasis or atypical infectious bronchiolitis. Lateral calcifications in the left breast. No blunting of the costophrenic angles. IMPRESSION: 1. Reticulonodular opacity laterally in the right upper lobe, possibly from atelectasis or atypical infectious bronchiolitis. 2. AICD. 3. 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 4. Lateral calcifications in the left breast. Electronically Signed   By: Freida Jes M.D.   On:  08/06/2023 17:00    Family Communication: Discussed with patient, she understand and agree. All questions answered.  Disposition: Status is: changed to Inpatient Remains inpatient appropriate because: IV antibiotics for bacteremia, weakness, PT eval  Planned Discharge Destination: Home with Home Health     Time spent: 38 minutes  Author: Aisha Hove, MD 08/08/2023 2:17 PM Secure chat 7am to 7pm For on call review [email or web address].

## 2023-08-08 NOTE — Progress Notes (Addendum)
 Pharmacy Note- Penicillin Allergy  Clarification   ASSESSMENT: Patient reported allergy  (rash) to PCNs in 2014, tolerated Ancef  02/16/20 and ceftriaxone  this hospitalization. Discussed previous PCN intolerance with patient, she stated her rash occurred a long time ago and she is amenable to re-challenging while inpatient.    PEN-FAST Scoring   Five years or less since last reaction 0  Anaphylaxis/Angioedema OR Severe cutaneous adverse reaction  0  Treatment required for reaction  0  Total Score 0 points - Very low risk of positive penicillin allergy  test (<1%)      Type of intervention (select all that apply):  Direct IV therapy  Impact on therapy (select all that apply):  Antibiotic de-escalation Ampicillin  is preferred therapy for enterococcus faecalis bacteremia. Can de-escalate from current vancomycin  regimen.    PLAN: - Re-challenge with ampicillin  2 g IV every 6 hours - Epi-Pen PRN  - IV diphenhydramine  PRN  - plan communicated to primary RN   POST-CHALLENGE FOLLOW-UP:  Patient tolerated ampicillin  with no rash or other issues. Will remove penicillin allergy  from her chart. Patient aware.    Thank you for allowing pharmacy to be a part of this patient's care.  Calton Nash, [place] PharmD Candidate 08/08/2023 14:14 PM  Damien Quiet, PharmD, BCPS, BCIDP Infectious Diseases Clinical Pharmacist Phone: [phone] 08/09/2023 10:28 AM

## 2023-08-09 ENCOUNTER — Encounter (HOSPITAL_COMMUNITY): Payer: Self-pay

## 2023-08-09 ENCOUNTER — Other Ambulatory Visit (HOSPITAL_COMMUNITY)

## 2023-08-09 DIAGNOSIS — J189 Pneumonia, unspecified organism: Secondary | ICD-10-CM | POA: Diagnosis not present

## 2023-08-09 DIAGNOSIS — N179 Acute kidney failure, unspecified: Secondary | ICD-10-CM | POA: Diagnosis not present

## 2023-08-09 DIAGNOSIS — R7881 Bacteremia: Secondary | ICD-10-CM | POA: Diagnosis not present

## 2023-08-09 DIAGNOSIS — B952 Enterococcus as the cause of diseases classified elsewhere: Secondary | ICD-10-CM | POA: Diagnosis not present

## 2023-08-09 DIAGNOSIS — N1832 Chronic kidney disease, stage 3b: Secondary | ICD-10-CM | POA: Diagnosis not present

## 2023-08-09 DIAGNOSIS — R531 Weakness: Secondary | ICD-10-CM | POA: Diagnosis not present

## 2023-08-09 LAB — BASIC METABOLIC PANEL WITH GFR
Anion gap: 12 (ref 5–15)
BUN: 33 mg/dL — ABNORMAL HIGH (ref 8–23)
CO2: 20 mmol/L — ABNORMAL LOW (ref 22–32)
Calcium: 7.9 mg/dL — ABNORMAL LOW (ref 8.9–10.3)
Chloride: 110 mmol/L (ref 98–111)
Creatinine, Ser: 1.51 mg/dL — ABNORMAL HIGH (ref 0.44–1.00)
GFR, Estimated: 36 mL/min — ABNORMAL LOW (ref >60)
Glucose, Bld: 165 mg/dL — ABNORMAL HIGH (ref 70–99)
Potassium: 3.2 mmol/L — ABNORMAL LOW (ref 3.5–5.1)
Sodium: 142 mmol/L (ref 135–145)

## 2023-08-09 LAB — CULTURE, BLOOD (ROUTINE X 2)

## 2023-08-09 LAB — GLUCOSE, CAPILLARY
Glucose-Capillary: 109 mg/dL — ABNORMAL HIGH (ref 70–99)
Glucose-Capillary: 179 mg/dL — ABNORMAL HIGH (ref 70–99)
Glucose-Capillary: 125 mg/dL — ABNORMAL HIGH (ref 70–99)
Glucose-Capillary: 122 mg/dL — ABNORMAL HIGH (ref 70–99)

## 2023-08-09 LAB — LEGIONELLA PNEUMOPHILA SEROGP 1 UR AG: L. pneumophila Serogp 1 Ur Ag: NEGATIVE

## 2023-08-09 NOTE — Progress Notes (Signed)
 Progress Note   Patient: Ebony Scott MVH:[id number] DOB: 02-16-1951 DOA: 08/06/2023     2 DOS: the patient was seen and examined on 08/09/2023   Brief hospital course:  Ebony Scott is a 73 y.o. 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 She is admitted for community acquired pneumonia management. Seen by pulmonologist.   Assessment and Plan: Sepsis due to Community-acquired pneumonia Recurrent pneumonias History of immunosuppression s/p recurrent pneumonias presented with generalized weakness, productive cough and fever. Chest imaging shows reticulonodular opacity laterally in the right upper lobe, possibly from atelectasis or atypical infectious bronchiolitis Continue Azithro x 3 days. Pulmonology consult appreciated, advised to continue current antibiotic regimen. Mucinex  DM twice daily. Encourage incentive spirometry, out of bed to chair. Supplemental O2 as needed.  Enterococcus bacteremia She presented with fever, tachycardia, lactic acid  normal. Blood cultures positive for enterococcus fecalis.  ID on board advised ampicillin therapy, TEE, EP consult. Tolerated ampicillin challenge (has penicillin allergy ). MRSA screen negative, stopped Vanco. Transfer to [facility] for TEE (Scheduled Monday with Dr. Nicolette Barrio) and EP consult (team aware)   AKI on CKD 3B Creatinine now at baseline of 1.3-1.5, got gentle IV fluids. Renal dysfunction secondary to hypovolemia in the setting of decreased p.o. intake Avoid nephrotoxic meds.    Chronic hypoxic respiratory failure Bronchiectasis Asthma At baseline patient uses 2-3 L  at night Currently on 2L at rest. Continue home bronchodilators Continue as needed DuoNebs. Pulmonology consult appreciated.   HFrEF s/p ICD. Last TTE in May 2024 showed EF 30-35%, echo done this admission no change in EF. No vegetations. EP consult at [facility] for possible explantation of ICD.  Seropositive RA Chronic steroid use Restarted home dose 7 mg of prednisone  daily after she got stress dose iv steroids. Scheduled to follow-up with rheumatology on 6/25   Hypogammaglobulinemia IgG, IgA and IgM levels within normal limit 2 months ago Continue IVIG as outpatient   Type 2 diabetes mellitus Last A1c 6.0% 8 months ago Continue sliding scale with insulin  given steroid use.  Hx of PE/DVT Chronic and stable Continue Xarelto    Chronic pain syndrome Resumed home dose gabapentin .   Generalized weakness Encourage out of bed to chair. PT/OT advised HH services.       Out of bed to chair. Incentive spirometry. Nursing supportive care. Fall, aspiration precautions. Diet:  Diet Orders (From admission, onward)     Start     Ordered   08/06/23 1941  Diet Heart Room service appropriate? Yes; Fluid consistency: Thin  Diet effective now       Question Answer Comment  Room service appropriate? Yes   Fluid consistency: Thin      08/06/23 1941           DVT prophylaxis:  rivaroxaban  (XARELTO ) tablet 20 mg  Level of care: Telemetry   Code Status: Limited: Do not attempt resuscitation (DNR) -DNR-LIMITED -Do Not Intubate/DNI   Subjective: Patient is seen and examined today morning. She is weak, did not get out of bed. Continues to be on 2L supplemental o2. Eating fair.   Physical Exam: Vitals:  08/09/23 0446 08/09/23 0843 08/09/23 1039 08/09/23 1239  BP: (!) 96/59  (!) 106/56 (!) 102/52  Pulse: 64  61 64  Resp: 17   20  Temp: 97.9 F (36.6 C)   98 F (36.7 C)   TempSrc:      SpO2: 97% 97%  95%  Weight:      Height:        General - Elderly Caucasian weak female, no apparent distress HEENT - PERRLA, EOMI, atraumatic head, non tender sinuses. Lung - Clear, diffuse rales, rhonchi, wheezes. Heart - S1, S2 heard, no murmurs, rubs, trace pedal edema. Abdomen - Soft, non tender, bowel sounds good Neuro - Alert, awake and oriented x 3, non focal exam. Skin - Warm and dry.  Data Reviewed:      Latest Ref Rng & Units 08/07/2023    4:30 AM 08/06/2023    4:11 PM 06/03/2023   10:29 AM  CBC  WBC 4.0 - 10.5 K/uL 7.3  9.0  8.3   Hemoglobin 12.0 - 15.0 g/dL 29.5  9.7  9.4   Hematocrit 36.0 - 46.0 % 32.5  31.5  28.5   Platelets 150 - 400 K/uL 113  140  187       Latest Ref Rng & Units 08/09/2023    4:22 AM 08/08/2023    8:38 AM 08/07/2023    4:30 AM  BMP  Glucose 70 - 99 mg/dL 621  308  657   BUN 8 - 23 mg/dL 33  30  30   Creatinine 0.44 - 1.00 mg/dL 8.46  9.62  9.52   Sodium 135 - 145 mmol/L 142  138  138   Potassium 3.5 - 5.1 mmol/L 3.2  3.6  4.1   Chloride 98 - 111 mmol/L 110  110  108   CO2 22 - 32 mmol/L 20  19  20    Calcium  8.9 - 10.3 mg/dL 7.9  8.1  8.5    ECHOCARDIOGRAM COMPLETE Result Date: 08/08/2023    ECHOCARDIOGRAM REPORT   Patient Name:   Ebony Scott Date of Exam: 08/08/2023 Medical Rec #:  [id number]    Height:       62.0 in Accession #:    [id number]   Weight:       130.1 lb Date of Birth:  1950-02-24     BSA:          1.592 m Patient Age:    73 years     BP:           125/67 mmHg Patient Gender: F            HR:           55 bpm. Exam Location:  Inpatient Procedure: 2D Echo, Intracardiac Opacification Agent, Cardiac Doppler and Color            Doppler (Both Spectral and Color Flow Doppler were utilized during            procedure). Indications:    Endocarditis  History:        Patient has prior history of Echocardiogram examinations, most                 recent 07/17/2022. CHF, CAD, Chronic Kidney Disease; Risk                  Factors:Dyslipidemia.  Sonographer:    Kip Peon RDCS Referring Phys: [id number] [facility]  Sonographer Comments: Suboptimal apical window. IMPRESSIONS  1. Left  ventricular ejection fraction, by estimation, is 25 to 30%. The left ventricle has severely decreased function. The left ventricle demonstrates regional wall motion abnormalities (see scoring diagram/findings for description). There is moderate asymmetric left ventricular hypertrophy of the basal-septal segment. Left ventricular diastolic parameters are consistent with Grade I diastolic dysfunction (impaired relaxation).  2. Right ventricular systolic function is normal. The right ventricular size is normal. There is normal pulmonary artery systolic pressure.  3. Left atrial size was moderately dilated.  4. The mitral valve is normal in structure. No evidence of mitral valve regurgitation. No evidence of mitral stenosis.  5. The aortic valve is tricuspid. There is mild calcification of the aortic valve. Aortic valve regurgitation is trivial. No aortic stenosis is present.  6. The inferior vena cava is normal in size with greater than 50% respiratory variability, suggesting right atrial pressure of 3 mmHg. FINDINGS  Left Ventricle: Left ventricular ejection fraction, by estimation, is 25 to 30%. The left ventricle has severely decreased function. The left ventricle demonstrates regional wall motion abnormalities. The left ventricular internal cavity size was normal  in size. There is moderate asymmetric left ventricular hypertrophy of the basal-septal segment. Left ventricular diastolic parameters are consistent with Grade I diastolic dysfunction (impaired relaxation).  LV Wall Scoring: The entire anterior septum, mid and distal inferior wall, mid inferoseptal segment, apical anterior segment, and apex are akinetic. Right Ventricle: The right ventricular size is normal. No increase in right ventricular wall thickness. Right ventricular systolic function  is normal. There is normal pulmonary artery systolic pressure. The tricuspid regurgitant velocity is 2.33 m/s, and  with an assumed right atrial pressure of 3 mmHg, the estimated right ventricular systolic pressure is 24.7 mmHg. Left Atrium: Left atrial size was moderately dilated. Right Atrium: Right atrial size was normal in size. Pericardium: There is no evidence of pericardial effusion. Mitral Valve: The mitral valve is normal in structure. No evidence of mitral valve regurgitation. No evidence of mitral valve stenosis. Tricuspid Valve: The tricuspid valve is normal in structure. Tricuspid valve regurgitation is trivial. No evidence of tricuspid stenosis. Aortic Valve: The aortic valve is tricuspid. There is mild calcification of the aortic valve. Aortic valve regurgitation is trivial. Aortic regurgitation PHT measures 460 msec. No aortic stenosis is present. Aortic valve mean gradient measures 12.0 mmHg.  Aortic valve peak gradient measures 20.8 mmHg. Aortic valve area, by VTI measures 0.75 cm. Pulmonic Valve: The pulmonic valve was normal in structure. Pulmonic valve regurgitation is not visualized. No evidence of pulmonic stenosis. Aorta: The aortic root is normal in size and structure. Venous: The inferior vena cava is normal in size with greater than 50% respiratory variability, suggesting right atrial pressure of 3 mmHg. IAS/Shunts: No atrial level shunt detected by color flow Doppler. Additional Comments: A device lead is visualized.  LEFT VENTRICLE PLAX 2D LVIDd:         5.60 cm   Diastology LVIDs:         3.05 cm   LV e' medial:    4.33 cm/s LV PW:         0.70 cm   LV E/e' medial:  18.7 LV IVS:        1.10 cm   LV e' lateral:   4.28 cm/s LVOT diam:     1.50 cm   LV E/e' lateral: 18.9 LV SV:         42 LV SV Index:   26 LVOT Area:     1.77 cm  RIGHT VENTRICLE            IVC RV S prime:     9.90 cm/s  IVC diam: 1.80 cm LEFT ATRIUM             Index LA diam:        3.30 cm 2.07 cm/m LA Vol (A2C):    47.5 ml 29.83 ml/m LA Vol (A4C):   28.0 ml 17.59 ml/m LA Biplane Vol: 39.5 ml 24.81 ml/m  AORTIC VALVE AV Area (Vmax):    0.76 cm AV Area (Vmean):   0.73 cm AV Area (VTI):     0.75 cm AV Vmax:           228.00 cm/s AV Vmean:          157.000 cm/s AV VTI:            0.557 m AV Peak Grad:      20.8 mmHg AV Mean Grad:      12.0 mmHg LVOT Vmax:         97.50 cm/s LVOT Vmean:        64.500 cm/s LVOT VTI:          0.237 m LVOT/AV VTI ratio: 0.43 AI PHT:            460 msec  AORTA Ao Root diam: 2.70 cm Ao Asc diam:  3.10 cm MITRAL VALVE               TRICUSPID VALVE MV Area (PHT): 3.12 cm    TR Peak grad:   21.7 mmHg MV Decel Time: 243 msec    TR Vmax:        233.00 cm/s MR Peak grad: 64.6 mmHg MR Vmax:      402.00 cm/s  SHUNTS MV E velocity: 81.00 cm/s  Systemic VTI:  0.24 m MV A velocity: 93.80 cm/s  Systemic Diam: 1.50 cm MV E/A ratio:  0.86 Jules Oar MD Electronically signed by Jules Oar MD Signature Date/Time: 08/08/2023/4:21:57 PM    Final     Family Communication: Discussed with patient, she understand and agree. All questions answered.  Disposition: Status is: changed to Inpatient Remains inpatient appropriate because: IV antibiotics for bacteremia, weakness, Transfer to [facility] for TEE, EP consult.  Planned Discharge Destination: Home with Home Health     Time spent: 39 minutes  Author: Aisha Hove, MD 08/09/2023 1:52 PM Secure chat 7am to 7pm For on call review [email or web address].

## 2023-08-09 NOTE — Progress Notes (Signed)
 Physical Therapy Treatment Patient Details Name: Ebony Scott MRN: [id number] DOB: [birth year] Today's Date: 08/09/2023   History of Present Illness 73 yo female admitted with Pna. Hx of RA-chronic prednision, HF, CAD, chronic resp failure-O2 at night, anemia, chronic pain, fibromyalgia, osteomyelitis, DVT, osteoporosis, breast Ca, CKD, DM    PT Comments   Pt admitted with above diagnosis.  Pt currently with functional limitations due to the deficits listed below (see PT Problem List). PT arrived and pt in bed. Pt exhibited signs and symptoms of SOB with pt reporting just having returned from using the bathroom and episode of loose bowels. Pt O2 saturation 97% on RA. Pt is mod I for bed mobility with HOB elevated, S and min cues for transfer tasks, gait tasks in hallway 150 feet with RW and S min cues for safety, posture and pursed lip breathing. Pt elected to return to bed and O2 saturation 96% on RA. Pt left in bed all needs in place and pt reports procedure scheduled at [facility] next week. Pt will benefit from ongoing therapy services in home setting. Frequent and unproductive cough noted. Pt will benefit from acute skilled PT to increase their independence and safety with mobility to allow discharge.      If plan is discharge home, recommend the following: A little help with walking and/or transfers;A little help with bathing/dressing/bathroom;Assistance with cooking/housework;Assist for transportation;Help with stairs or ramp for entrance   Can travel by private vehicle        Equipment Recommendations  None recommended by PT    Recommendations for Other Services       Precautions / Restrictions Precautions Precautions: Fall Precaution/Restrictions Comments: oxygen  at night only at baseline Restrictions Weight Bearing Restrictions Per Provider Order: No Other Position/Activity Restrictions: she reports having chronic pain in her feet     Mobility  Bed Mobility Overal bed mobility:  Modified Independent Bed Mobility: Supine to Sit, Sit to Supine     Supine to sit: Modified independent (Device/Increase time) Sit to supine: Modified independent (Device/Increase time)        Transfers Overall transfer level: Needs assistance Equipment used: Rolling walker (2 wheels) Transfers: Sit to/from Stand Sit to Stand: Supervision           General transfer comment: min cues    Ambulation/Gait Ambulation/Gait assistance: Supervision Gait Distance (Feet): 150 Feet Assistive device: Rolling walker (2 wheels) Gait Pattern/deviations: Step-through pattern Gait velocity: decreased     General Gait Details: min cues for RW management and breathing stratagies. slight trunk flexion with B UE support at Rw   Stairs             Wheelchair Mobility     Tilt Bed    Modified Rankin (Stroke Patients Only)       Balance Overall balance assessment: Needs assistance Sitting-balance support: No upper extremity supported, Feet unsupported Sitting balance-Leahy Scale: Good     Standing balance support: Bilateral upper extremity supported, During functional activity, Reliant on assistive device for balance Standing balance-Leahy Scale: Fair Standing balance comment: static standing no UE support                            Communication Communication Communication: No apparent difficulties  Cognition Arousal: Alert Behavior During Therapy: WFL for tasks assessed/performed   PT - Cognitive impairments: No apparent impairments  Following commands: Intact      Cueing Cueing Techniques: Verbal cues  Exercises      General Comments General comments (skin integrity, edema, etc.): pt reports bouts of loose bowels and vaginal discomfort      Pertinent Vitals/Pain Pain Assessment Pain Assessment: 0-10 Pain Score: 4  Pain Location: feet Pain Descriptors / Indicators: Discomfort, Aching, Constant Pain  Intervention(s): Limited activity within patient's tolerance, Monitored during session    Home Living                          Prior Function            PT Goals (current goals can now be found in the care plan section) Acute Rehab PT Goals Patient Stated Goal: none stated PT Goal Formulation: With patient Time For Goal Achievement: 08/21/23 Potential to Achieve Goals: Good Progress towards PT goals: Progressing toward goals    Frequency    Min 3X/week      PT Plan      Co-evaluation              AM-PAC PT 6 Clicks Mobility   Outcome Measure  Help needed turning from your back to your side while in a flat bed without using bedrails?: None Help needed moving from lying on your back to sitting on the side of a flat bed without using bedrails?: None Help needed moving to and from a bed to a chair (including a wheelchair)?: A Little Help needed standing up from a chair using your arms (e.g., wheelchair or bedside chair)?: A Little Help needed to walk in hospital room?: A Little Help needed climbing 3-5 steps with a railing? : A Little 6 Click Score: 20    End of Session Equipment Utilized During Treatment: Gait belt Activity Tolerance: Patient tolerated treatment well Patient left: with call bell/phone within reach;in bed Nurse Communication: Mobility status PT Visit Diagnosis: Difficulty in walking, not elsewhere classified (R26.2);Muscle weakness (generalized) (M62.81)     Time: 1610-9604 PT Time Calculation (min) (ACUTE ONLY): 16 min  Charges:    $Gait Training: 8-22 mins PT General Charges $$ ACUTE PT VISIT: 1 Visit                     Cary Clarks, PT Acute Rehab    Annalee Kiang 08/09/2023, 4:22 PM

## 2023-08-09 NOTE — TOC Progression Note (Signed)
 Transition of Care [facility]) - Progression Note    Patient Details  Name: Ebony Scott MRN: [id number] Date of Birth: [birth year]  [facility] CM/SW Contact  Levie Ream, RN Phone Number: 08/09/2023, 10:14 AM  Clinical Narrative:    Received recc for HHPT/OT LVM for pt's son Analycia Khokhar ([phone]) to discuss; awaiting return call.   Expected Discharge Plan: Home/Self Care Barriers to Discharge: Continued Medical Work up  Expected Discharge Plan and Services In-house Referral: NA Discharge Planning Services: CM Consult Post Acute Care Choice: NA Living arrangements for the past 2 months: Single Family Home                 DME Arranged: N/A DME Agency: NA       HH Arranged: NA HH Agency: NA         Social Determinants of Health (SDOH) Interventions SDOH Screenings   Food Insecurity: No Food Insecurity (08/07/2023)  Housing: Low Risk  (08/07/2023)  Transportation Needs: No Transportation Needs (08/07/2023)  Utilities: Not At Risk (08/07/2023)  Alcohol  Screen: Low Risk  (11/22/2022)  Depression (PHQ2-9): Low Risk  (05/29/2023)  Financial Resource Strain: Low Risk  (03/26/2023)  Physical Activity: Inactive (03/26/2023)  Social Connections: Socially Isolated (08/07/2023)  Stress: Stress Concern Present (03/26/2023)  Tobacco Use: Low Risk  (08/07/2023)  Health Literacy: Adequate Health Literacy (11/22/2022)    Readmission Risk Interventions    05/20/2023   12:38 PM 12/06/2022    2:19 PM 12/03/2022    3:04 PM  Readmission Risk Prevention Plan  Transportation Screening Complete Complete Complete  PCP or Specialist Appt within 3-5 Days  Complete Complete  HRI or Home Care Consult  Complete Complete  Social Work Consult for Recovery Care Planning/Counseling  Complete Complete  Palliative Care Screening  Complete Complete  Medication Review Oceanographer) Complete Complete Complete  PCP or Specialist appointment within 3-5 days of discharge Complete     HRI or Home Care Consult Complete    SW Recovery Care/Counseling Consult Complete    Palliative Care Screening Not Applicable    Skilled Nursing Facility Complete

## 2023-08-09 NOTE — Progress Notes (Signed)
 Patient transferred to [address] east for cardiac monitoring, report called to Janessa, Charity fundraiser - all belongings sent with the patient, family at the bedside at the time of transfer.

## 2023-08-09 NOTE — Progress Notes (Signed)
 Occupational Therapy Treatment Patient Details Name: Ebony Scott MRN: [id number] DOB: 1950-11-07 Today's Date: 08/09/2023   History of present illness 73 yr old female admitted with PNA. PMH: RA, HF, CAD, chronic resp failure-O2 at night, anemia, chronic pain, fibromyalgia, osteomyelitis, DVT, osteoporosis, breast CA, CKD, DM   OT comments  The pt presented with good effort and participation in the session. She was seen for functional strengthening and progression of functional activity. She performed supine to sit with supervision, donning a hospital gown around her back seated EOB with min assist, and sit to stand and ambulation using a RW with CGA. She reported mild fatigue with activity and a history of chronic pain in her feet. She is progressing toward her OT goals. Continue OT plan of care. Home health OT is recommended.       If plan is discharge home, recommend the following:  A little help with walking and/or transfers;A little help with bathing/dressing/bathroom;Help with stairs or ramp for entrance   Equipment Recommendations  None recommended by OT    Recommendations for Other Services      Precautions / Restrictions Restrictions Weight Bearing Restrictions Per Provider Order: No Other Position/Activity Restrictions: she reports having chronic pain in her feet       Mobility Bed Mobility Overal bed mobility: Needs Assistance Bed Mobility: Supine to Sit     Supine to sit: Supervision, HOB elevated, Used rails Sit to supine: Supervision        Transfers Overall transfer level: Needs assistance Equipment used: Rolling walker (2 wheels) Transfers: Sit to/from Stand Sit to Stand: Contact guard assist                 Balance     Sitting balance-Leahy Scale: Good       Standing balance-Leahy Scale: Fair              ADL either performed or assessed with clinical judgement   ADL                   Upper Body Dressing : Sitting;Minimal  assistance Upper Body Dressing Details (indicate cue type and reason): A hospital gown was donned around her back, while she was seated EOB.                                  Communication Communication Communication: No apparent difficulties   Cognition Arousal: Alert Behavior During Therapy: WFL for tasks assessed/performed Cognition: No apparent impairments        Following commands: Intact                      Pertinent Vitals/ Pain       Pain Assessment Pain Assessment: 0-10 Pain Score: 5  Pain Location: feet Pain Intervention(s): Monitored during session, Repositioned   Frequency  Min 2X/week        Progress Toward Goals  OT Goals(current goals can now be found in the care plan section)  Progress towards OT goals: Progressing toward goals  Acute Rehab OT Goals OT Goal Formulation: With patient Time For Goal Achievement: 08/21/23 Potential to Achieve Goals: Good  Plan         AM-PAC OT 6 Clicks Daily Activity     Outcome Measure   Help from another person eating meals?: None Help from another person taking care of personal grooming?: None Help from another person toileting, which  includes using toliet, bedpan, or urinal?: A Little Help from another person bathing (including washing, rinsing, drying)?: A Little Help from another person to put on and taking off regular upper body clothing?: A Little Help from another person to put on and taking off regular lower body clothing?: A Little 6 Click Score: 20    End of Session Equipment Utilized During Treatment: Gait belt;Rolling walker (2 wheels)  OT Visit Diagnosis: Unsteadiness on feet (R26.81);Pain;Muscle weakness (generalized) (M62.81) Pain - part of body:  (feet)   Activity Tolerance Patient tolerated treatment well   Patient Left in bed;with call bell/phone within reach;with bed alarm set   Nurse Communication Mobility status        Time: 1200-1210 OT Time Calculation  (min): 10 min  Charges: OT General Charges $OT Visit: 1 Visit OT Treatments $Therapeutic Activity: 8-22 mins     Sheralyn Dies, OTR/L 08/09/2023, 12:50 PM

## 2023-08-10 DIAGNOSIS — J189 Pneumonia, unspecified organism: Secondary | ICD-10-CM | POA: Diagnosis not present

## 2023-08-10 LAB — BASIC METABOLIC PANEL WITH GFR
Anion gap: 8 (ref 5–15)
BUN: 25 mg/dL — ABNORMAL HIGH (ref 8–23)
CO2: 20 mmol/L — ABNORMAL LOW (ref 22–32)
Calcium: 7.8 mg/dL — ABNORMAL LOW (ref 8.9–10.3)
Chloride: 114 mmol/L — ABNORMAL HIGH (ref 98–111)
Creatinine, Ser: 1.18 mg/dL — ABNORMAL HIGH (ref 0.44–1.00)
GFR, Estimated: 48 mL/min — ABNORMAL LOW (ref >60)
Glucose, Bld: 92 mg/dL (ref 70–99)
Potassium: 3.3 mmol/L — ABNORMAL LOW (ref 3.5–5.1)
Sodium: 142 mmol/L (ref 135–145)

## 2023-08-10 LAB — GLUCOSE, CAPILLARY
Glucose-Capillary: 160 mg/dL — ABNORMAL HIGH (ref 70–99)
Glucose-Capillary: 112 mg/dL — ABNORMAL HIGH (ref 70–99)
Glucose-Capillary: 137 mg/dL — ABNORMAL HIGH (ref 70–99)
Glucose-Capillary: 126 mg/dL — ABNORMAL HIGH (ref 70–99)

## 2023-08-10 MED ORDER — LOPERAMIDE HCL 2 MG PO CAPS
4.0000 mg | ORAL_CAPSULE | ORAL | Status: DC | PRN
  Administered 2023-08-10 – 2023-08-15 (×2): 4 mg via ORAL
  Filled 2023-08-10 (×3): qty 2

## 2023-08-10 MED ORDER — POTASSIUM CHLORIDE CRYS ER 20 MEQ PO TBCR
40.0000 meq | EXTENDED_RELEASE_TABLET | Freq: Once | ORAL | Status: AC
  Administered 2023-08-10: 40 meq via ORAL
  Filled 2023-08-10: qty 2

## 2023-08-10 MED ORDER — IPRATROPIUM BROMIDE 0.02 % IN SOLN
0.5000 mg | Freq: Four times a day (QID) | RESPIRATORY_TRACT | Status: DC
  Administered 2023-08-10 (×2): 0.5 mg via RESPIRATORY_TRACT
  Filled 2023-08-10 (×2): qty 2.5

## 2023-08-10 MED ORDER — FUROSEMIDE 40 MG PO TABS
40.0000 mg | ORAL_TABLET | Freq: Every day | ORAL | Status: DC
  Administered 2023-08-11 – 2023-08-15 (×5): 40 mg via ORAL
  Filled 2023-08-10 (×5): qty 1

## 2023-08-10 MED ORDER — AZITHROMYCIN 250 MG PO TABS
500.0000 mg | ORAL_TABLET | Freq: Every day | ORAL | Status: AC
  Administered 2023-08-10 – 2023-08-13 (×4): 500 mg via ORAL
  Filled 2023-08-10 (×4): qty 2

## 2023-08-10 MED ORDER — FLUCONAZOLE 100 MG PO TABS
100.0000 mg | ORAL_TABLET | Freq: Every day | ORAL | Status: AC
  Administered 2023-08-10 – 2023-08-12 (×3): 100 mg via ORAL
  Filled 2023-08-10 (×3): qty 1

## 2023-08-10 MED ORDER — LEVALBUTEROL HCL 1.25 MG/0.5ML IN NEBU
1.2500 mg | INHALATION_SOLUTION | Freq: Four times a day (QID) | RESPIRATORY_TRACT | Status: DC
  Administered 2023-08-10 (×2): 1.25 mg via RESPIRATORY_TRACT
  Filled 2023-08-10 (×4): qty 0.5

## 2023-08-10 NOTE — Progress Notes (Signed)
 Progress Note   Patient: Ebony Scott FMW:[id number] DOB: [birth year] DOA: 08/06/2023     3 DOS: the patient was seen and examined on 08/10/2023   Brief hospital course:  Ebony Scott is a 73 y.o. 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 She is admitted for community acquired pneumonia management. Seen by pulmonologist.  ==========================================================  Subjective: The patient was seen and examined this morning, in no acute distress, no issues overnight, hemodynamically stable, satting 98% on 2 L of oxygen    ------------------------------------------------------------------------------------------------------ Assessment and Plan: Sepsis due to Community-acquired pneumonia Recurrent pneumonias -Improved sepsis etiology, but still requiring 2 L of oxygen , satting 98% History of immunosuppression s/p recurrent pneumonias Continue Azithro x . Pulmonology consult appreciated, advised to continue current antibiotic regimen.  Mucinex  DM twice daily. Encourage incentive spirometry, out of bed to chair. Supplemental O2 as needed.  Enterococcus bacteremia She presented with fever, tachycardia, lactic acid  normal. Blood cultures positive for enterococcus fecalis.  ID on board advised ampicillin  therapy, TEE, EP consult. Tolerated ampicillin  challenge (has penicillin allergy ).  MRSA screen negative, stopped Vanco. Transfer to [facility] for TEE (Scheduled Monday with Dr. Rollene) and EP consult (team aware)    AKI on CKD 3B Creatinine now at baseline of 1.3-1.5, got gentle IV fluids. -Creatinine elevated to 1.51 avoid nephrotoxins Avoid nephrotoxic meds.   Chronic hypoxic respiratory failure / Bronchiectasis/ Asthma At baseline patient uses 2-3 L [place] at night Currently on 2L at rest. Continue home bronchodilators Continue as needed DuoNebs.  Pulmonology consulted -follow-up appreciated   HFrEF s/p ICD. Last TTE in May 2024 showed EF 30-35%, echo done this admission no change in EF. No vegetations. EP consult at [facility] for possible explantation of ICD.  Seropositive RA - Chronic steroid use Restarted home dose 7 mg of prednisone  daily after she got stress dose iv steroids. Scheduled to follow-up with rheumatology on 6/25   Hypogammaglobulinemia IgG, IgA and IgM levels within normal limit 2 months ago Continue IVIG as outpatient   Type 2 diabetes mellitus Last A1c 6.0% 8 months ago Continue sliding scale with insulin  given steroid use.  Hx of PE/DVT Chronic and stable Continue Xarelto    Chronic pain syndrome Resumed home dose gabapentin .   Hypokalemia  - Monitoring and replacing orally with magnesium   generalized weakness Encourage out of bed to chair. PT/OT advised HH services.       Out of bed to chair. Incentive spirometry. Nursing supportive care. Fall, aspiration precautions. Diet:  Diet Orders (From admission, onward)     Start     Ordered   08/06/23 1941  Diet Heart Room service appropriate? Yes; Fluid consistency: Thin  Diet effective now       Question Answer Comment  Room service appropriate? Yes   Fluid consistency: Thin      08/06/23 1941           DVT prophylaxis:  rivaroxaban  (XARELTO ) tablet 20 mg  Level of care: Telemetry   Code Status: Limited: Do not attempt resuscitation (DNR) -DNR-LIMITED -Do Not Intubate/DNI     Physical Exam: Vitals:  08/10/23 0650 08/10/23 0817 08/10/23 0820 08/10/23 1200  BP: (!) 142/59  (!) 122/46 (!)  116/52  Pulse:  66 63   Resp: 16 18 18 18   Temp: 98.8 F (37.1 C)  97.6 F (36.4 C) 97.6 F (36.4 C)  TempSrc: Oral  Oral Oral  SpO2:  99% 98%   Weight:      Height:       General:  AAO x 3,  cooperative, no distress;   HEENT:  Normocephalic, PERRL, otherwise with in Normal limits   Neuro:  CNII-XII intact. , normal motor and sensation, reflexes intact   Lungs:   Clear to auscultation BL, Respirations unlabored,  No wheezes / crackles  Cardio:    S1/S2, RRR, No murmure, No Rubs or Gallops   Abdomen:  Soft, non-tender, bowel sounds active all four quadrants, no guarding or peritoneal signs.  Muscular  skeletal:  Limited exam -global generalized weaknesses - in bed, able to move all 4 extremities,   2+ pulses,  symmetric, No pitting edema  Skin:  Dry, warm to touch, negative for any Rashes,  Wounds: Please see nursing documentation    Data Reviewed:     Latest Ref Rng & Units 08/07/2023    4:30 AM 08/06/2023    4:11 PM 06/03/2023   10:29 AM  CBC  WBC 4.0 - 10.5 K/uL 7.3  9.0  8.3   Hemoglobin 12.0 - 15.0 g/dL 89.8  9.7  9.4   Hematocrit 36.0 - 46.0 % 32.5  31.5  28.5   Platelets 150 - 400 K/uL 113  140  187       Latest Ref Rng & Units 08/09/2023    4:22 AM 08/08/2023    8:38 AM 08/07/2023    4:30 AM  BMP  Glucose 70 - 99 mg/dL 834  790  893   BUN 8 - 23 mg/dL 33  30  30   Creatinine 0.44 - 1.00 mg/dL 8.48  8.70  8.39   Sodium 135 - 145 mmol/L 142  138  138   Potassium 3.5 - 5.1 mmol/L 3.2  3.6  4.1   Chloride 98 - 111 mmol/L 110  110  108   CO2 22 - 32 mmol/L 20  19  20    Calcium  8.9 - 10.3 mg/dL 7.9  8.1  8.5    ECHOCARDIOGRAM COMPLETE Result Date: 08/08/2023    ECHOCARDIOGRAM REPORT   Patient Name:   Ebony Scott Date of                  FINDINGS  Left Ventricle: Left ventricular ejection fraction, by estimation, is 25 to 30%. The left ventricle has severely decreased function. The left ventricle demonstrates regional wall motion abnormalities. The left ventricular  internal cavity size was normal  in size. There is moderate asymmetric left ventricular hypertrophy of the basal-septal segment. Left ventricular diastolic parameters are consistent with Grade I diastolic dysfunction (impaired relaxation).  LV Wall Scoring: The entire anterior septum, mid and distal inferior wall, mid inferoseptal segment, apical anterior segment, and apex are akinetic. Right Ventricle: The right ventricular size is normal. No increase in right ventricular wall thickness. Right ventricular systolic function is normal. There is normal pulmonary artery systolic pressure. The tricuspid regurgitant velocity is 2.33 m/s, and  with an assumed right atrial pressure of 3 mmHg, the estimated right ventricular systolic pressure is 24.7 mmHg. Left Atrium: Left atrial size was moderately dilated. Right Atrium: Right atrial size was normal  in size. Pericardium: There is no evidence of pericardial effusion. Mitral Valve: The mitral valve is normal in structure. No evidence of mitral valve regurgitation. No evidence of mitral valve stenosis. Tricuspid Valve: The tricuspid valve is normal in structure. Tricuspid valve regurgitation is trivial. No evidence of tricuspid stenosis. Aortic Valve: The aortic valve is tricuspid. There is mild calcification of the aortic valve. Aortic valve regurgitation is trivial. Aortic regurgitation PHT measures 460 msec. No aortic stenosis is present. Aortic valve mean gradient measures 12.0 mmHg.  Aortic valve peak gradient measures 20.8 mmHg. Aortic valve area, by VTI measures 0.75 cm. Pulmonic Valve: The pulmonic valve was normal in structure. Pulmonic valve regurgitation is not visualized. No evidence of pulmonic stenosis. Aorta: The aortic root is normal in size and structure. Venous: The inferior vena cava is normal in size with greater than 50% respiratory variability, suggesting right atrial pressure of 3 mmHg. IAS/Shunts: No atrial level shunt detected by color flow Doppler.  Additional Comments:    Family Communication: Discussed with patient, she understand and agree. All questions answered.  Disposition: Status is: changed to Inpatient Remains inpatient appropriate because: IV antibiotics for bacteremia, weakness, Transfer to [facility] for TEE, EP consult.  Planned Discharge Destination: Home with Home Health     Time spent: 50 minutes  Author:  SIGNED: Adriana DELENA Grams, MD, FHM. FAAFP [facility],  Pager (please use [email or web address] to page/text)  Please use Epic Secure Chat for non-urgent communication (7AM-7PM) If 7PM-7AM, please contact night-coverage [email or web address],  08/10/2023, 1:08 PM

## 2023-08-10 NOTE — Plan of Care (Signed)

## 2023-08-10 NOTE — Progress Notes (Addendum)
 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  No recent GI procedures.  Possible diarrhea a few days ago.  No abdominal pain or urinary symptoms.  UA was benign. - Following arrival she had a temp of 103, WBC 7.3.  Blood cultures returned positive for 2/2 sets E faecalis. -CT scan without contrast showed new areas of tree-in-bud peribronchial nodularity with peripheral consolidation medial left upper lobe keeping with acute infectious or inflammatory process.,  Improvement of right lower lobe consolidation..  -TTE no veg -PCCM noted place bck on homesteroids #Reported history of penicillin allergy  with rash in 2014.  Tolerated Ancef  in 2021 - tolerated amp challenge   Recommendations:  -D/C vanc -Start ampicillin , continue ceftriaxone  - Complete azithromycin  x 3 days - Follow repeat blood Cx -Plan [facility] transfer for TEE and EP evaluation   Evaluation of this patient requires complex antimicrobial therapy evaluation and counseling + isolation needs for disease transmission risk assessment and  mitigation      Microbiology:   Antibiotics:  Azihtromycin 6/12- Ctx 6/17- Vancomycin  6/18- Cultures: Blood 6/172/2 efaecalis 1/2 staph epi 6/19 ng  SUBJECTIVE: Tolerated amp, no rash. Some loose stool  Interval: Afebrile overnight  Review of Systems: Review of Systems  All other systems reviewed and are negative.    Scheduled Meds:  acidophilus  1 capsule Oral QPM   atorvastatin   80 mg Oral QPM   budesonide -glycopyrrolate -formoterol   2 puff Inhalation BID   cholecalciferol   1,000 Units Oral QHS   clopidogrel   75 mg Oral Daily   empagliflozin   10 mg Oral Daily   fluticasone   2 spray Each Nare Daily   folic acid   1 mg Oral Daily   furosemide   40 mg Oral Once per day on Monday Thursday   gabapentin   300 mg Oral QHS   guaiFENesin   600 mg Oral BID   insulin  aspart  0-5 Units Subcutaneous QHS   insulin  aspart  0-9 Units Subcutaneous TID WC   isosorbide -hydrALAZINE   1 tablet Oral BID   pantoprazole   40 mg Oral Daily   predniSONE   2 mg Oral Q breakfast   predniSONE   5 mg Oral Q breakfast   rivaroxaban   20 mg Oral Q supper   sacubitril -valsartan   1 tablet Oral BID   Continuous Infusions:  ampicillin  (OMNIPEN) IV 2 g (08/09/23 2328)   cefTRIAXone  (ROCEPHIN )  IV 2 g (08/09/23 2215)   PRN Meds:.acetaminophen  **OR** acetaminophen , ipratropium-albuterol , ondansetron  **OR** ondansetron  (ZOFRAN ) IV, senna-docusate Allergies  Allergen Reactions   Baclofen  Anaphylaxis and Other (See Comments)    Altered mental status  requiring a 3-day hospital stay- patient became unresponsive   Put me in a coma for five days, Altered mental status requiring a 3-day hospital stay- patient became unresponsive   Other Shortness Of Breath and Other (See Comments)    Grass and weeds sneezing; filled sinuses (02/20/2012)   Pork-Derived Products Anaphylaxis   Shellfish Allergy  Anaphylaxis, Shortness Of Breath, Itching and Rash   Shrimp Extract Anaphylaxis and Other (See Comments)    ALL  SHELLFISH   Tetracycline Hcl Nausea And Vomiting   Xolair  [Omalizumab ] Other (See Comments)    Caused Blood clot   Zoledronic  Acid Other (See Comments)    Reclast - Fever, Put in hospital, dr said it was a reaction from a reaction    Fever, inability to walk, Put in hospital  Fever, Put in hospital, dr said it was a reaction from a reaction , Other reaction(s): Unknown   Celecoxib Swelling    Feet swelling   Dilaudid  [Hydromorphone  Hcl] Itching   Hydrocodone -Acetaminophen  Itching   Levofloxacin  Other (See Comments)    GI upset   Molds & Smuts Cough   Morphine  And Codeine Hives and Itching   Oxycodone  Hcl Nausea And Vomiting    GI Intolerance   Paroxetine Nausea And Vomiting and Other (See Comments)    GI Intolerance also   Tetracycline Hcl Other (See Comments)    GI Intolerance   Oxycodone -Acetaminophen  Itching   Diltiazem  Hcl Swelling   Dust Mite Extract Other (See Comments) and Cough    sneezing (02/20/2012) too   Gamunex [Immune Globulin ] Itching and Rash    Tolerates hizentra     Rituximab  Other (See Comments)    Anemia    Rofecoxib Swelling    Vioxx- feet swelling   Tree Extract Itching    tested and told I was allergic to it; never experienced a reaction to it (02/20/2012)    OBJECTIVE: Vitals:   08/09/23 1239 08/09/23 1830 08/09/23 1930 08/09/23 2254  BP: (!) 102/52  (!) 119/59 116/62  Pulse: 64  68 72  Resp: 20  18 18   Temp: 98 F (36.7 C)  98.3 F (36.8 C) 98.1 F (36.7 C)  TempSrc:    Oral  SpO2: 95% 97% 93% 96%  Weight:    55.5 kg  Height:       Body mass index is 22.38 kg/m.  Physical Exam Constitutional:      Appearance: Normal appearance.  HENT:     Head: Normocephalic and atraumatic.     Right Ear: Tympanic membrane normal.     Left Ear: Tympanic membrane normal.     Nose: Nose normal.     Mouth/Throat:     Mouth: Mucous membranes are moist.   Eyes:     Extraocular Movements: Extraocular movements intact.     Conjunctiva/sclera:  Conjunctivae normal.     Pupils: Pupils are equal, round, and reactive to light.    Cardiovascular:     Rate and Rhythm: Normal rate and regular rhythm.     Heart sounds: No murmur heard.    No friction rub. No gallop.  Pulmonary:     Effort: Pulmonary effort is normal.     Breath sounds: Normal breath sounds.  Abdominal:     General: Abdomen is flat.     Palpations: Abdomen is soft.   Musculoskeletal:        General: Normal range of motion.   Skin:    General: Skin is warm and dry.   Neurological:  General: No focal deficit present.     Mental Status: She is alert and oriented to person, place, and time.   Psychiatric:        Mood and Affect: Mood normal.       Lab Results Lab Results  Component Value Date   WBC 7.3 08/07/2023   HGB 10.1 (L) 08/07/2023   HCT 32.5 (L) 08/07/2023   MCV 105.5 (H) 08/07/2023   PLT 113 (L) 08/07/2023    Lab Results  Component Value Date   CREATININE 1.51 (H) 08/09/2023   BUN 33 (H) 08/09/2023   NA 142 08/09/2023   K 3.2 (L) 08/09/2023   CL 110 08/09/2023   CO2 20 (L) 08/09/2023    Lab Results  Component Value Date   ALT 26 08/06/2023   AST 37 08/06/2023   ALKPHOS 40 08/06/2023   BILITOT 0.9 08/06/2023        Ebony Stank, MD [facility] 08/10/2023, 6:12 AM

## 2023-08-10 NOTE — Hospital Course (Addendum)
 Brief Narrative:    73 y.o. 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  She is admitted for CIED associated E faecalis bacteremia, community acquired pneumonia, acute respiratory failure and AKI.  ID following.  ICD extracted on 7/2.  Plan for Rocephin  2 g every 12 hours until 09/19/2023.  Patient is no longer on Plavix .  Xarelto  is to be resumed on August 26, 2023.  Continue antibiotics as mentioned above.  Will need outpatient follow-up with cardiology and PCP.  Assessment & Plan:  Principal Problem:   Community acquired pneumonia Active Problems:   Chronic systolic heart failure (HCC)   Generalized weakness   AKI (acute kidney injury) (HCC)   Enterococcus faecalis infection   Enterococcal bacteremia    CIED associated Enterococcus faecalis bacteremia Seen by ID.  ICD extracted 7/2 - Will need antibiotics ampicillin  8 g continuous infusion plus Rocephin  2 g every 12 hours until 09/19/2023.  Initial blood culture 6/17-positive for Enterococcus faecalis Repeat blood culture 6/19-NGTD TEE 6/23-no thrombus or vegetation seen    Sepsis due to community-acquired pneumonia/recurrent pneumonitis Currently on antibiotic therapy   Acute kidney injury on stage IIIb chronic kidney disease Baseline creatinine reportedly 1.3-1.5.  Creatinine peaked 2.10, now back to baseline - Continue Jardiance , Entresto    Acute on chronic respiratory failure with hypoxia/bronchiectasis At baseline patient reportedly uses [place] oxygen  2 to 3 L/min at  night. Acute respiratory failure has resolved.  May have to do home O2 evaluation at time of discharge.   Ischemic cardiomyopathy/chronic HFrEF/s/p ICD ef 25% Overall appears euvolemic.  Lasix  and BiDil  are currently on hold Continue Entresto  and Jardiance  ICD extracted 7/2   CAD s/p DES to LAD Lipitor..  Plavix  discontinued by cardiology 6/27.   History of PE/DVT Management of Xarelto  as noted above   Hypogammaglobulinemia On IVIG as outpatient. Follows with Dr. Maurilio, Allergist.  Patient has missed a couple doses of the IVIG while hospitalized which she has to get weekly.  As discussed with daughter, has an appointment on Wednesday of next week to see him and can maybe get it at that time.   Seropositive rheumatoid arthritis/chronic steroid use Back on prednisone  5 Mg daily.  Follow-up outpatient rheumatology   Type II DM, controlled A1c 6.5 on 6/18 CBGs well-controlled, discontinued SSI.   Chronic pain syndrome Continue gabapentin    Hypokalemia Replaced.   Generalized weakness/physical deconditioning PT/OT   Chronic cough Reports that this has been going on for maybe a year and is unchanged. Initiated Tessalon  Perles and Robitussin DM as needed.  Monitor.  Improved cough.   Body mass index is 22.38 kg/m.     DVT prophylaxis: Place and maintain sequential compression device Start: 08/19/23 0910     Code Status: Limited: Do not attempt resuscitation (DNR) -DNR-LIMITED -Do Not Intubate/DNI :  Family Communication: Daughter via phone.  Disposition:  Status is: Inpatient Remains inpatient appropriate because: Definitive plans after ICD extraction     DVT prophylaxis: Place and maintain sequential compression device Start: 08/19/23 0910      Code Status: Limited: Do not attempt resuscitation (DNR) -DNR-LIMITED -Do Not Intubate/DNI  Family Communication:   Status is: Inpatient Remains inpatient appropriate because: Will need SNF placement once cleared by  cardiology    Subjective:  Seen at bedside after ICD extraction. No complaints  Examination:  General exam: Appears calm and comfortable  Respiratory system: Clear to auscultation. Respiratory effort normal. Cardiovascular system: S1 & S2 heard, RRR. No JVD, murmurs, rubs, gallops or clicks. No pedal edema. Gastrointestinal system: Abdomen is nondistended, soft and nontender. No organomegaly or masses felt. Normal bowel sounds heard. Central nervous system: Alert and oriented. No focal neurological deficits. Extremities: Symmetric 5 x 5 power. Skin: No rashes, lesions or ulcers Psychiatry: Judgement and insight appear normal. Mood & affect appropriate.

## 2023-08-11 DIAGNOSIS — J189 Pneumonia, unspecified organism: Secondary | ICD-10-CM | POA: Diagnosis not present

## 2023-08-11 DIAGNOSIS — B952 Enterococcus as the cause of diseases classified elsewhere: Secondary | ICD-10-CM | POA: Diagnosis not present

## 2023-08-11 DIAGNOSIS — R7881 Bacteremia: Secondary | ICD-10-CM | POA: Diagnosis not present

## 2023-08-11 LAB — GLUCOSE, CAPILLARY
Glucose-Capillary: 101 mg/dL — ABNORMAL HIGH (ref 70–99)
Glucose-Capillary: 127 mg/dL — ABNORMAL HIGH (ref 70–99)
Glucose-Capillary: 127 mg/dL — ABNORMAL HIGH (ref 70–99)
Glucose-Capillary: 118 mg/dL — ABNORMAL HIGH (ref 70–99)

## 2023-08-11 MED ORDER — NYSTATIN 100000 UNIT/GM EX CREA
TOPICAL_CREAM | Freq: Three times a day (TID) | CUTANEOUS | Status: DC
  Filled 2023-08-11: qty 30

## 2023-08-11 MED ORDER — RISAQUAD PO CAPS
1.0000 | ORAL_CAPSULE | Freq: Three times a day (TID) | ORAL | Status: DC
  Administered 2023-08-11 – 2023-08-22 (×33): 1 via ORAL
  Filled 2023-08-11 (×33): qty 1

## 2023-08-11 MED ORDER — SACUBITRIL-VALSARTAN 24-26 MG PO TABS
1.0000 | ORAL_TABLET | Freq: Two times a day (BID) | ORAL | Status: DC
  Administered 2023-08-12 – 2023-08-15 (×8): 1 via ORAL
  Filled 2023-08-11 (×8): qty 1

## 2023-08-11 NOTE — Consult Note (Signed)
 Cardiology Consultation   Patient ID: Ebony Scott MRN: [id number]; DOB: February 02, 1951  Admit date: 08/06/2023 Date of Consult: 08/11/2023  PCP:  [name], MD   [facility] Providers Cardiologist:  Shelda Bruckner, MD  Electrophysiologist:  Eulas FORBES Furbish, MD       Patient Profile: Ebony Scott is a 73 y.o. female with a hx of ICM who is being seen 08/11/2023 for the evaluation of bacteremia at the request of Dr. Willette.  History of Present Illness: Ebony Scott is a pleasant 73 yo patient of Dr. Dwyane with a h/o tracheobronchomalacia. She underwent ICD insertion about 11 months ago. She was in her usual state of health when she developed severe weakness, fever and chills and was found to have E.Faecalis bacteremia. She feels much better with IV anti-biotics. She had a TTE demonstrating no vegetation. She denies symptoms of IE stigmata. She denies pain or tenderness or swelling of her PM pocket.    Past Medical History:  Diagnosis Date   Anemia    Angio-edema    Breast cancer (HCC) 1998   Left breast, in remission   Bronchiectasis (HCC)    CHF (congestive heart failure) (HCC)    Closed displaced fracture of metatarsal bone of right foot 01/20/2019   Closed fracture of fifth metatarsal bone of right foot, initial encounter 01/15/2018   Clostridium difficile colitis 01/14/2018   Coronary artery disease    Depression    some; don't take anything for it (02/20/2012), pt denies dx as of 06/02/21   Diverticulosis    DVT (deep venous thrombosis) (HCC)    Fibromyalgia 11/2011   Fracture of base of fifth metatarsal bone with routine healing, left    GAVE (gastric antral vascular ectasia)    GERD (gastroesophageal reflux disease)    Headache(784.0)    related to allergies; more at different times during the year (02/20/2012)   Hemorrhoids    Hiatal hernia    back and neck   History of blood transfusion 2023   1 unit, 5 units of Iron    Hx of adenomatous  colonic polyps 04/12/2016   Hypercholesteremia    good cholesterol is high   Hypothyroidism    h/o Graves disease   IBS (irritable bowel syndrome)    Moderate persistent asthma    -FeV1 72% 2011, -IgE 102 2011, CT sinus Neg 2011   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 Cindy Setter   Sleep apnea    uses cpap nightly   Tracheobronchomalacia    Vasculitis (HCC) 12/13/2017    Past Surgical History:  Procedure Laterality Date   ABDOMINAL HYSTERECTOMY N/A    Phreesia 10/21/2019   ANTERIOR AND POSTERIOR REPAIR  1990's   APPENDECTOMY     ARGON LASER APPLICATION  02/05/2021   Procedure: ARGON LASER APPLICATION;  Surgeon: Leigh Elspeth SQUIBB, MD;  Location: [facility];  Service: Gastroenterology;;   BREAST LUMPECTOMY  1998   left   BREAST SURGERY N/A    Phreesia 10/21/2019   BRONCHIAL WASHINGS  04/05/2020   Procedure: BRONCHIAL WASHINGS;  Surgeon: Kara Dorn NOVAK, MD;  Location: [facility];  Service: Pulmonary;;   CAPSULOTOMY Right 08/28/2021   Procedure: CAPSULOTOMY;  Surgeon: Tobie Franky SQUIBB, DPM;  Location: [facility];  Service: Podiatry;  Laterality: Right;   CARPOMETACARPEL (CMC) FUSION OF THUMB WITH AUTOGRAFT FROM RADIUS  ~ 2009   both thumbs (02/20/2012)   CATARACT EXTRACTION W/ INTRAOCULAR LENS  IMPLANT, BILATERAL  2012   CERVICAL DISCECTOMY  10/2001   C5-C6   CERVICAL FUSION  2003   C3-C4   CHOLECYSTECTOMY     COLONOSCOPY     CORONARY STENT INTERVENTION N/A 01/11/2021   Procedure: CORONARY STENT INTERVENTION;  Surgeon: Anner Alm ORN, MD;  Location: [facility] INVASIVE CV LAB;  Service: Cardiovascular;  Laterality: N/A;   DEBRIDEMENT TENNIS ELBOW  ?1970's   right   ESOPHAGOGASTRODUODENOSCOPY     ESOPHAGOGASTRODUODENOSCOPY (EGD) WITH PROPOFOL  N/A 02/05/2021    Procedure: ESOPHAGOGASTRODUODENOSCOPY (EGD) WITH PROPOFOL ;  Surgeon: Leigh Elspeth SQUIBB, MD;  Location: [facility];  Service: Gastroenterology;  Laterality: N/A;   EYE SURGERY N/A    Phreesia 10/21/2019   HAMMER TOE SURGERY Right 08/28/2021   Procedure: HAMMER TOE REPAIR 2ND TOE RIGHT FOOT;  Surgeon: Tobie Franky SQUIBB, DPM;  Location: [facility];  Service: Podiatry;  Laterality: Right;   HYSTERECTOMY     I & D EXTREMITY Right 09/22/2020   Procedure: IRRIGATION AND DEBRIDEMENT RIGHT HAND AND ELBOW;  Surgeon: Josefina Chew, MD;  Location: [facility];  Service: Orthopedics;  Laterality: Right;   ICD IMPLANT N/A 08/27/2022   Procedure: ICD IMPLANT;  Surgeon: Nancey Eulas BRAVO, MD;  Location: [facility]AS[facility] CV LAB;  Service: Cardiovascular;  Laterality: N/A;   KNEE ARTHROPLASTY  ?8009'd   ?right; w/cartilage repair (02/20/2012)   MASS EXCISION Left 08/28/2021   Procedure: EXCISION OF SOFT TISSUE  MASS LEFT FOOT;  Surgeon: Tobie Franky SQUIBB, DPM;  Location: [facility];  Service: Podiatry;  Laterality: Left;   NASAL SEPTUM SURGERY  1980's   POSTERIOR CERVICAL FUSION/FORAMINOTOMY  2004   failed initial fusion; rewired  anterior neck (02/20/2012)   REVERSE SHOULDER ARTHROPLASTY Right 02/16/2020   Procedure: REVERSE SHOULDER ARTHROPLASTY;  Surgeon: Josefina Chew, MD;  Location: [facility];  Service: Orthopedics;  Laterality: Right;   RIGHT/LEFT HEART CATH AND CORONARY ANGIOGRAPHY N/A 01/11/2021   Procedure: RIGHT/LEFT HEART CATH AND CORONARY ANGIOGRAPHY;  Surgeon: Anner Alm ORN, MD;  Location: [facility] INVASIVE CV LAB;  Service: Cardiovascular;  Laterality: N/A;   SPINE SURGERY N/A    Phreesia 10/21/2019   TONSILLECTOMY  ~ 1953   VESICOVAGINAL FISTULA CLOSURE W/ TAH  1988   VIDEO BRONCHOSCOPY Bilateral 08/23/2016   Procedure: VIDEO BRONCHOSCOPY WITH FLUORO;  Surgeon: Noreen Tonnie BRAVO, MD;  Location: [facility];  Service: Cardiopulmonary;  Laterality: Bilateral;   VIDEO BRONCHOSCOPY N/A 04/05/2020   Procedure: VIDEO  BRONCHOSCOPY WITHOUT FLUORO;  Surgeon: Kara Dorn NOVAK, MD;  Location: [facility];  Service: Pulmonary;  Laterality: N/A;     Home Medications:  Prior to Admission medications   Medication Sig Start Date End Date Taking? Authorizing Provider  acetaminophen  (TYLENOL ) 500 MG tablet Take 1,000 mg by mouth 2 (two) times daily as needed for moderate pain (pain score 4-6).   Yes [provider]  albuterol  (PROVENTIL ) (2.5 MG/3ML) 0.083% nebulizer solution Take 3 mLs (2.5 mg total) by nebulization every 4 (four) hours as needed for wheezing or shortness of breath. 01/01/23  Yes Kozlow, Camellia PARAS, MD  albuterol  (VENTOLIN  HFA) 108 (90 Base) MCG/ACT inhaler Inhale 2 puffs into the lungs every 6 (six) hours as needed for wheezing or shortness of breath. 01/01/23  Yes Kozlow, Camellia PARAS, MD  allopurinol  (ZYLOPRIM ) 100 MG tablet Take 1 tablet (100  mg total) by mouth daily. 03/04/23  Yes [name], MD  Alpha-D-Galactosidase (BEANO PO) Take 2 tablets by mouth in the morning, at noon, and at bedtime.   Yes [provider]  Alpha-Lipoic Acid 600 MG TABS Take 600 mg by mouth in the morning.   Yes [provider]  atorvastatin  (LIPITOR) 80 MG tablet Take 1 tablet (80 mg total) by mouth every evening. 03/04/23  Yes [name], MD  Azelastine  HCl 137 MCG/SPRAY SOLN 2 sprays once per day Patient taking differently: Place 1 spray into the nose daily. 08/02/23  Yes Aletha Bene, MD  Calcium -Magnesium -Vitamin D  (CITRACAL CALCIUM +D) 600-40-500 MG-MG-UNIT TB24 Take 1 tablet by mouth daily.   Yes [provider]  cetirizine  (ZYRTEC ) 10 MG tablet Take 1 tablet (10 mg total) by mouth daily as needed for allergies (Can take a n extra dose during flare ups.). Patient taking differently: Take 10 mg by mouth at bedtime. 04/16/23  Yes Kozlow, Camellia PARAS, MD  Cholecalciferol  (VITAMIN D3) 25 MCG (1000 UT) capsule Take 1,000 Units by mouth at bedtime.   Yes [provider]   clopidogrel  (PLAVIX ) 75 MG tablet TAKE 1 TABLET BY MOUTH EVERY DAY 07/19/23  Yes Lonni Slain, MD  dexlansoprazole  (DEXILANT ) 60 MG capsule Take 1 capsule (60 mg total) by mouth in the morning. 04/16/23  Yes Kozlow, Camellia PARAS, MD  diclofenac  Sodium (VOLTAREN ) 1 % GEL Apply 1 Application topically as needed (pain).   Yes [provider]  empagliflozin  (JARDIANCE ) 10 MG TABS tablet Take 1 tablet (10 mg total) by mouth daily. 07/05/23  Yes [name], MD  ENTRESTO  24-26 MG TAKE 1 TABLET BY MOUTH TWICE A DAY 05/07/23  Yes Lonni Slain, MD  EPINEPHRINE  0.3 mg/0.3 mL IJ SOAJ injection USE AS DIRECTED BY YOUR PHYSICIAN INTRAMUSCULARLY AS NEEDED FOR ANAPHYLAXIS 10/09/22  Yes Kozlow, Camellia PARAS, MD  famotidine  (PEPCID ) 20 MG tablet TAKE 1 TABLET (20 MG) BY MOUTH DAILY BEFORE LUNCH AND1 TABLET BY MOUTH EACH NIGHT AT BEDTIME Patient taking differently: Take 20 mg by mouth at bedtime. 05/07/23  Yes Kozlow, Camellia PARAS, MD  fluticasone  (FLONASE ) 50 MCG/ACT nasal spray Place 2 sprays into both nostrils daily. Patient taking differently: Place 1 spray into both nostrils daily as needed for allergies. 08/02/23  Yes Aletha Bene, MD  Fluticasone -Umeclidin-Vilant (TRELEGY ELLIPTA ) 200-62.5-25 MCG/ACT AEPB INHALE 1 INHALATION INTO THE LUNGS IN THE MORNING. Patient taking differently: Inhale 1 Inhalation into the lungs at bedtime. 04/16/23  Yes Kozlow, Camellia PARAS, MD  folic acid  (FOLVITE ) 1 MG tablet Take 1 tablet (1 mg total) by mouth daily. 03/04/23  Yes [name], MD  furosemide  (LASIX ) 40 MG tablet Take 40 mg by mouth 2 (two) times a week. Monday and Thursday 01/18/23  Yes [provider]  gabapentin  (NEURONTIN ) 300 MG capsule Take 1 capsule (300 mg total) by mouth at bedtime. 05/04/22  Yes Patsy Lenis, MD  guaiFENesin  (MUCINEX ) 600 MG 12 hr tablet Take 600 mg by mouth 2 (two) times daily.   Yes [provider]  HIZENTRA  10 GM/50ML SOLN Inject 15 g into the skin once a  week. 13 g Infusion 01/14/23  Yes Kozlow, Camellia PARAS, MD  ipratropium-albuterol  (DUONEB) 0.5-2.5 (3) MG/3ML SOLN Take 3 mLs by nebulization every 6 (six) hours as needed. Patient taking differently: Take 3 mLs by nebulization daily as needed (wheezing/sob). 07/22/23  Yes Kara Dorn NOVAK, MD  isosorbide -hydrALAZINE  (BIDIL ) 20-37.5 MG tablet TAKE 1 TABLET BY MOUTH TWICE A  DAY 05/07/23  Yes Lonni Slain, MD  Lactase (LACTOSE FAST ACTING RELIEF PO) Take 2 tablets by mouth 3 (three) times daily before meals.   Yes [provider]  magnesium  oxide (MAG-OX) 400 (240 Mg) MG tablet Take 400 mg by mouth at bedtime.   Yes [provider]  montelukast  (SINGULAIR ) 10 MG tablet Take 1 tablet (10 mg total) by mouth daily after supper. 03/04/23  Yes [name], MD  mupirocin  ointment (BACTROBAN ) 2 % Apply 1 Application topically as needed (wounds). 01/29/23  Yes Kozlow, Camellia PARAS, MD  nystatin  (MYCOSTATIN ) 100000 UNIT/ML suspension Swish, gargle, and swallow 5 ml after Trelegy use 05/24/23  Yes Perri DELENA Meliton Mickey., MD  potassium chloride  (KLOR-CON ) 10 MEQ tablet Take 10 mEq by mouth See admin instructions. Mondays and Thursdays. 01/18/23  Yes [provider]  predniSONE  (DELTASONE ) 5 MG tablet Take 1 tablet (5 mg total) by mouth daily with breakfast. 05/13/23  Yes Rice, Lonni ORN, MD  Prenatal Vit-Fe Fumarate-FA (PRENATAL VITAMINS PO) Take 1 tablet by mouth in the morning.   Yes [provider]  Probiotic Product (ALIGN) 4 MG CAPS Take 4 mg by mouth every evening.   Yes [provider]  rivaroxaban  (XARELTO ) 20 MG TABS tablet Take 1 tablet (20 mg total) by mouth daily with supper. 03/04/23  Yes [name], MD  sodium bicarbonate  650 MG tablet Take 650 mg by mouth daily.   Yes [provider]  sodium chloride  HYPERTONIC 3 % nebulizer solution Take 4 mLs by nebulization at bedtime.   Yes [provider]  traMADol  (ULTRAM ) 50 MG tablet  Take 1 tablet (50 mg total) by mouth every 6 (six) hours as needed. Patient taking differently: Take 50 mg by mouth at bedtime. 07/05/23  Yes [name], MD  traZODone  (DESYREL ) 50 MG tablet TAKE 2 TABLETS BY MOUTH AT BEDTIME. Patient taking differently: Take 50 mg by mouth at bedtime. 07/04/23  Yes [name], MD  denosumab  (PROLIA ) 60 MG/ML SOSY injection Inject 60 mg into the skin every 6 (six) months. Patient not taking: Reported on 08/07/2023    [provider]  Respiratory Therapy Supplies (NEBULIZER MASK ADULT) MISC 1 Device by Does not apply route as directed. 01/01/23   Kozlow, Eric J, MD    Scheduled Meds:  acidophilus  1 capsule Oral QPM   atorvastatin   80 mg Oral QPM   azithromycin   500 mg Oral Daily   budesonide -glycopyrrolate -formoterol   2 puff Inhalation BID   cholecalciferol   1,000 Units Oral QHS   clopidogrel   75 mg Oral Daily   empagliflozin   10 mg Oral Daily   fluconazole   100 mg Oral Daily   fluticasone   2 spray Each Nare Daily   folic acid   1 mg Oral Daily   furosemide   40 mg Oral Daily   gabapentin   300 mg Oral QHS   guaiFENesin   600 mg Oral BID   insulin  aspart  0-5 Units Subcutaneous QHS   insulin  aspart  0-9 Units Subcutaneous TID WC   isosorbide -hydrALAZINE   1 tablet Oral BID   pantoprazole   40 mg Oral Daily   predniSONE   5 mg Oral Q breakfast   rivaroxaban   20 mg Oral Q supper   [START ON 08/12/2023] sacubitril -valsartan   1 tablet Oral BID   Continuous Infusions:  ampicillin  (OMNIPEN) IV 2 g (08/11/23 0504)   cefTRIAXone  (ROCEPHIN )  IV 2 g (08/11/23 0804)   PRN Meds: acetaminophen  **OR** acetaminophen , ipratropium-albuterol , loperamide , ondansetron  **  OR** ondansetron  (ZOFRAN ) IV, senna-docusate  Allergies:    Allergies  Allergen Reactions   Baclofen  Anaphylaxis and Other (See Comments)    Altered mental status requiring a 3-day hospital stay- patient became unresponsive   Put me in a coma for five days, Altered mental status  requiring a 3-day hospital stay- patient became unresponsive   Other Shortness Of Breath and Other (See Comments)    Grass and weeds sneezing; filled sinuses (02/20/2012)   Pork-Derived Products Anaphylaxis   Shellfish Allergy  Anaphylaxis, Shortness Of Breath, Itching and Rash   Shrimp Extract Anaphylaxis and Other (See Comments)    ALL SHELLFISH   Tetracycline Hcl Nausea And Vomiting   Xolair  [Omalizumab ] Other (See Comments)    Caused Blood clot   Zoledronic  Acid Other (See Comments)    Reclast - Fever, Put in hospital, dr said it was a reaction from a reaction    Fever, inability to walk, Put in hospital  Fever, Put in hospital, dr said it was a reaction from a reaction , Other reaction(s): Unknown   Celecoxib Swelling    Feet swelling   Dilaudid  [Hydromorphone  Hcl] Itching   Hydrocodone -Acetaminophen  Itching   Levofloxacin  Other (See Comments)    GI upset   Molds & Smuts Cough   Morphine  And Codeine Hives and Itching   Oxycodone  Hcl Nausea And Vomiting    GI Intolerance   Paroxetine Nausea And Vomiting and Other (See Comments)    GI Intolerance also   Tetracycline Hcl Other (See Comments)    GI Intolerance   Oxycodone -Acetaminophen  Itching   Diltiazem  Hcl Swelling   Dust Mite Extract Other (See Comments) and Cough    sneezing (02/20/2012) too   Gamunex [Immune Globulin ] Itching and Rash    Tolerates hizentra     Rituximab  Other (See Comments)    Anemia    Rofecoxib Swelling    Vioxx- feet swelling   Tree Extract Itching    tested and told I was allergic to it; never experienced a reaction to it (02/20/2012)    Social History:   Social History   Socioeconomic History   Marital status: Divorced    Spouse name: Not on file   Number of children: 2   Years of education: college   Highest education level: Bachelor's degree (e.g., BA, AB, BS)  Occupational History   Occupation: Disabled    Comment: Retired Dispensing optician: RETIRED   Tobacco Use   Smoking status: Never    Passive exposure: Past   Smokeless tobacco: Never   Tobacco comments:    Parents  Vaping Use   Vaping status: Never Used  Substance and Sexual Activity   Alcohol  use: Not Currently    Alcohol /week: 0.0 standard drinks of alcohol    Drug use: No   Sexual activity: Not Currently    Birth control/protection: Surgical    Comment: Hysterectomy  Other Topics Concern   Not on file  Social History Narrative   Patient lives at home alone. Patient  divorced.    Patient has her BS degree.   Right handed.   Caffeine- sometimes coffee.      Many Pulmonary:   Born in [place], [place]. She worked as a Armed forces operational officer. She has no pets currently. She does have indoor plants. Previously had mold in her home that was remediated. Carpet was removed.          Social Drivers of Health   Financial Resource Strain: Low Risk  (03/26/2023)  Overall Financial Resource Strain (CARDIA)    Difficulty of Paying Living Expenses: Not very hard  Food Insecurity: No Food Insecurity (08/07/2023)   Hunger Vital Sign    Worried About Running Out of Food in the Last Year: Never true    Ran Out of Food in the Last Year: Never true  Transportation Needs: No Transportation Needs (08/07/2023)   PRAPARE - Administrator, Civil Service (Medical): No    Lack of Transportation (Non-Medical): No  Physical Activity: Inactive (03/26/2023)   Exercise Vital Sign    Days of Exercise per Week: 0 days    Minutes of Exercise per Session: 30 min  Stress: Stress Concern Present (03/26/2023)   [employer] of Occupational Health - Occupational Stress Questionnaire    Feeling of Stress : To some extent  Social Connections: Socially Isolated (08/07/2023)   Social Connection and Isolation Panel    Frequency of Communication with Friends and Family: Never    Frequency of Social Gatherings with Friends and Family: Never    Attends Religious Services: Never    Doctor, general practice or Organizations: No    Attends Banker Meetings: Never    Marital Status: Divorced  Catering manager Violence: Not At Risk (08/07/2023)   Humiliation, Afraid, Rape, and Kick questionnaire    Fear of Current or Ex-Partner: No    Emotionally Abused: No    Physically Abused: No    Sexually Abused: No    Family History:    Family History  Problem Relation Age of Onset   Allergies Mother    Heart disease Mother    Arthritis Mother    Lung cancer Mother        heavy smoker   Diabetes Mother    Allergies Father    Heart disease Father    Arthritis Father    Stroke Father    Heart disease Brother    Diabetes Maternal Grandfather    Colitis Daughter    Colon cancer Other        Maternal half uncle     ROS:  Please see the history of present illness.   All other ROS reviewed and negative.     Physical Exam/Data: Vitals:   08/10/23 2351 08/11/23 0424 08/11/23 0738 08/11/23 0742  BP: (!) 108/40 (!) 141/63 97/85   Pulse: 69 (!) 57    Resp: 16 16 17 17   Temp: 98.3 F (36.8 C) 98.7 F (37.1 C) 98 F (36.7 C)   TempSrc: Oral Oral Axillary   SpO2: 100% 100%    Weight:      Height:        Intake/Output Summary (Last 24 hours) at 08/11/2023 1109 Last data filed at 08/11/2023 0740 Gross per 24 hour  Intake 1580.08 ml  Output 800 ml  Net 780.08 ml      08/09/2023   10:54 PM 08/07/2023   11:00 PM 08/02/2023    2:12 PM  Last 3 Weights  Weight (lbs) 122 lb 5.7 oz 130 lb 1.1 oz 130 lb  Weight (kg) 55.5 kg 59 kg 58.968 kg     Body mass index is 22.38 kg/m.  General:  Well nourished, well developed, in no acute distress HEENT: normal Neck: no JVD Vascular: No carotid bruits; Distal pulses 2+ bilaterally Cardiac:  normal S1, S2; RRR; soft systolic murmur  Lungs:  clear to auscultation bilaterally, no wheezing, rhonchi or rales  Abd: soft, nontender, no hepatomegaly  Ext: no edema  Musculoskeletal:  No deformities, BUE and BLE strength normal and  equal Skin: warm and dry  Neuro:  CNs 2-12 intact, no focal abnormalities noted Psych:  Normal affect   EKG:  The EKG was personally reviewed and demonstrates:  none Telemetry:  Telemetry was personally reviewed and demonstrates:  nsr  Relevant CV Studies: TTE with no veg  Laboratory Data: High Sensitivity Troponin:  No results for input(s): TROPONINIHS in the last 720 hours.   Chemistry Recent Labs  Lab 08/07/23 0430 08/08/23 0838 08/09/23 0422  NA 138 138 142  K 4.1 3.6 3.2*  CL 108 110 110  CO2 20* 19* 20*  GLUCOSE 106* 209* 165*  BUN 30* 30* 33*  CREATININE 1.60* 1.29* 1.51*  CALCIUM  8.5* 8.1* 7.9*  GFRNONAA 33* 43* 36*  ANIONGAP 10 9 12     Recent Labs  Lab 08/06/23 1611  PROT 6.6  ALBUMIN 2.9*  AST 37  ALT 26  ALKPHOS 40  BILITOT 0.9   Lipids No results for input(s): CHOL, TRIG, HDL, LABVLDL, LDLCALC, CHOLHDL in the last 168 hours.  Hematology Recent Labs  Lab 08/06/23 1611 08/07/23 0430  WBC 9.0 7.3  RBC 2.91* 3.08*  HGB 9.7* 10.1*  HCT 31.5* 32.5*  MCV 108.2* 105.5*  MCH 33.3 32.8  MCHC 30.8 31.1  RDW 15.8* 15.3  PLT 140* 113*   Thyroid  No results for input(s): TSH, FREET4 in the last 168 hours.  BNPNo results for input(s): BNP, PROBNP in the last 168 hours.  DDimer No results for input(s): DDIMER in the last 168 hours.  Radiology/Studies:  ECHOCARDIOGRAM COMPLETE Result Date: 08/08/2023    ECHOCARDIOGRAM REPORT   Patient Name:   Ebony Scott Date of Exam: 08/08/2023 Medical Rec #:  [id number]    Height:       62.0 in Accession #:    [id number]   Weight:       130.1 lb Date of Birth:  1950/07/26     BSA:          1.592 m Patient Age:    73 years     BP:           125/67 mmHg Patient Gender: F            HR:           55 bpm. Exam Location:  Inpatient Procedure: 2D Echo, Intracardiac Opacification Agent, Cardiac Doppler and Color            Doppler (Both Spectral and Color Flow Doppler were utilized during            procedure).  Indications:    Endocarditis  History:        Patient has prior history of Echocardiogram examinations, most                 recent 07/17/2022. CHF, CAD, Chronic Kidney Disease; Risk                 Factors:Dyslipidemia.  Sonographer:    Koleen Popper RDCS Referring Phys: [id number] [facility]  Sonographer Comments: Suboptimal apical window. IMPRESSIONS  1. Left ventricular ejection fraction, by estimation, is 25 to 30%. The left ventricle has severely decreased function. The left ventricle demonstrates regional wall motion abnormalities (see scoring diagram/findings for description). There is moderate asymmetric left ventricular hypertrophy of the basal-septal segment. Left ventricular diastolic parameters are consistent with Grade I diastolic dysfunction (impaired relaxation).  2. Right ventricular systolic function is normal. The right ventricular  size is normal. There is normal pulmonary artery systolic pressure.  3. Left atrial size was moderately dilated.  4. The mitral valve is normal in structure. No evidence of mitral valve regurgitation. No evidence of mitral stenosis.  5. The aortic valve is tricuspid. There is mild calcification of the aortic valve. Aortic valve regurgitation is trivial. No aortic stenosis is present.  6. The inferior vena cava is normal in size with greater than 50% respiratory variability, suggesting right atrial pressure of 3 mmHg. FINDINGS  Left Ventricle: Left ventricular ejection fraction, by estimation, is 25 to 30%. The left ventricle has severely decreased function. The left ventricle demonstrates regional wall motion abnormalities. The left ventricular internal cavity size was normal  in size. There is moderate asymmetric left ventricular hypertrophy of the basal-septal segment. Left ventricular diastolic parameters are consistent with Grade I diastolic dysfunction (impaired relaxation).  LV Wall Scoring: The entire anterior septum, mid and distal inferior wall, mid  inferoseptal segment, apical anterior segment, and apex are akinetic. Right Ventricle: The right ventricular size is normal. No increase in right ventricular wall thickness. Right ventricular systolic function is normal. There is normal pulmonary artery systolic pressure. The tricuspid regurgitant velocity is 2.33 m/s, and  with an assumed right atrial pressure of 3 mmHg, the estimated right ventricular systolic pressure is 24.7 mmHg. Left Atrium: Left atrial size was moderately dilated. Right Atrium: Right atrial size was normal in size. Pericardium: There is no evidence of pericardial effusion. Mitral Valve: The mitral valve is normal in structure. No evidence of mitral valve regurgitation. No evidence of mitral valve stenosis. Tricuspid Valve: The tricuspid valve is normal in structure. Tricuspid valve regurgitation is trivial. No evidence of tricuspid stenosis. Aortic Valve: The aortic valve is tricuspid. There is mild calcification of the aortic valve. Aortic valve regurgitation is trivial. Aortic regurgitation PHT measures 460 msec. No aortic stenosis is present. Aortic valve mean gradient measures 12.0 mmHg.  Aortic valve peak gradient measures 20.8 mmHg. Aortic valve area, by VTI measures 0.75 cm. Pulmonic Valve: The pulmonic valve was normal in structure. Pulmonic valve regurgitation is not visualized. No evidence of pulmonic stenosis. Aorta: The aortic root is normal in size and structure. Venous: The inferior vena cava is normal in size with greater than 50% respiratory variability, suggesting right atrial pressure of 3 mmHg. IAS/Shunts: No atrial level shunt detected by color flow Doppler. Additional Comments: A device lead is visualized.  LEFT VENTRICLE PLAX 2D LVIDd:         5.60 cm   Diastology LVIDs:         3.05 cm   LV e' medial:    4.33 cm/s LV PW:         0.70 cm   LV E/e' medial:  18.7 LV IVS:        1.10 cm   LV e' lateral:   4.28 cm/s LVOT diam:     1.50 cm   LV E/e' lateral: 18.9 LV SV:          42 LV SV Index:   26 LVOT Area:     1.77 cm  RIGHT VENTRICLE            IVC RV S prime:     9.90 cm/s  IVC diam: 1.80 cm LEFT ATRIUM             Index LA diam:        3.30 cm 2.07 cm/m LA Vol (A2C):   47.5 ml  29.83 ml/m LA Vol (A4C):   28.0 ml 17.59 ml/m LA Biplane Vol: 39.5 ml 24.81 ml/m  AORTIC VALVE AV Area (Vmax):    0.76 cm AV Area (Vmean):   0.73 cm AV Area (VTI):     0.75 cm AV Vmax:           228.00 cm/s AV Vmean:          157.000 cm/s AV VTI:            0.557 m AV Peak Grad:      20.8 mmHg AV Mean Grad:      12.0 mmHg LVOT Vmax:         97.50 cm/s LVOT Vmean:        64.500 cm/s LVOT VTI:          0.237 m LVOT/AV VTI ratio: 0.43 AI PHT:            460 msec  AORTA Ao Root diam: 2.70 cm Ao Asc diam:  3.10 cm MITRAL VALVE               TRICUSPID VALVE MV Area (PHT): 3.12 cm    TR Peak grad:   21.7 mmHg MV Decel Time: 243 msec    TR Vmax:        233.00 cm/s MR Peak grad: 64.6 mmHg MR Vmax:      402.00 cm/s  SHUNTS MV E velocity: 81.00 cm/s  Systemic VTI:  0.24 m MV A velocity: 93.80 cm/s  Systemic Diam: 1.50 cm MV E/A ratio:  0.86 Toribio Fuel MD Electronically signed by Toribio Fuel MD Signature Date/Time: 08/08/2023/4:21:57 PM    Final      Assessment and Plan: Enterococcal bacteremia - she will need TEE and removal of the ICD. Continue IV anti-biotics as per the ID service.  ICM - she denies anginal symptoms. Continue home meds as bp allows.    Risk Assessment/Risk Scores:      For questions or updates, please contact [facility] Please consult [email or web address] for contact info under    Signed, Danelle Birmingham, MD  08/11/2023 11:09 AM

## 2023-08-11 NOTE — Plan of Care (Signed)
   Problem: Clinical Measurements: Goal: Ability to maintain clinical measurements within normal limits will improve Outcome: Progressing Goal: Will remain free from infection Outcome: Progressing Goal: Diagnostic test results will improve Outcome: Progressing   Problem: Coping: Goal: Level of anxiety will decrease Outcome: Progressing

## 2023-08-11 NOTE — Progress Notes (Signed)
 Progress Note   Patient: Ebony Scott FMW:[id number]  DOB: 09/13/1950            DOA: 08/06/2023     4  DOS: the patient was seen and examined on 08/11/2023   Brief hospital course:  Ebony Scott is a 73 y.o. 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 She is admitted for community acquired pneumonia management. Seen by pulmonologist.  =========================================================  Subjective: The patient was seen and examined this morning, stable no acute distress denies any chest pain--- shortness of breath with exertion, no issues overnight   ------------------------------------------------------------------------------------------------------ Assessment and Plan:  Sepsis due to Community-acquired pneumonia Recurrent pneumonias -Hemodynamically stable off supplemental oxygen , satting 97% on room air  History of immunosuppression s/p recurrent pneumonias Continue Azithro x . Pulmonology consult was following  ID: Following-recommending antibiotics and duration  Mucinex  DM twice daily. Encourage incentive spirometry, out of bed to chair. Supplemental O2 as needed.  Enterococcus bacteremia -Hemodynamically stable POA:  fever, tachycardia, lactic acid  normal. Blood cultures positive for enterococcus fecalis.  ID on board advised ampicillin  therapy, TEE, EP consult. Tolerated ampicillin  challenge (has penicillin allergy ).  MRSA screen negative, stopped Vanco. Transfer to [facility] for TEE (Scheduled Monday  08/12/2023 with Dr. Rollene) and EP consult (team aware)   AKI on CKD 3B Creatinine now at baseline of 1.3-1.5, got gentle IV fluids. -Creatinine elevated to 1.51 avoid nephrotoxins Avoid nephrotoxic meds.   Chronic hypoxic respiratory failure / Bronchiectasis/ Asthma At baseline patient uses 2-3 L [place] at night -2 L at rest, currently on room air satting 97% Continue home bronchodilators Continue as needed DuoNebs.  Pulmonology consulted -follow-up appreciated   HFrEF s/p ICD. Last TTE in May 2024 showed EF 30-35%, echo done this admission no change in EF. No vegetations. EP consult at [facility] for possible explantation of ICD.  Seropositive RA - Chronic steroid use Restarted home dose 7 mg of prednisone  daily after she got stress dose iv steroids. Scheduled to follow-up with rheumatology on 6/25   Hypogammaglobulinemia IgG, IgA and IgM levels within normal limit 2 months ago Continue IVIG as outpatient   Type 2 diabetes mellitus Last A1c 6.0% 8 months ago Continue sliding scale with insulin  given steroid use.  Hx of PE/DVT Chronic and stable Continue Xarelto    Chronic pain syndrome - Resumed gabapentin .   Hypokalemia -monitoring and replaced with magnesium    generalized weakness Encourage out of bed to chair. PT/OT advised HH services.    Out of bed to chair. Incentive spirometry. Nursing supportive care. Fall, aspiration precautions. Diet:  Diet Orders (From admission, onward)     Start     Ordered   08/06/23 1941  Diet Heart Room service appropriate? Yes; Fluid consistency: Thin  Diet effective now       Question Answer Comment  Room service appropriate? Yes   Fluid consistency: Thin      08/06/23 1941           DVT prophylaxis:  rivaroxaban  (XARELTO ) tablet 20 mg  Level of care: Telemetry   Code Status: Limited: Do not  attempt resuscitation (DNR) -DNR-LIMITED -Do Not Intubate/DNI     Physical Exam: Vitals:   08/11/23 0424 08/11/23 0738  08/11/23 0742 08/11/23 1157  BP: (!) 141/63 97/85  (!) 114/56  Pulse: (!) 57     Resp: 16 17 17 17   Temp: 98.7 F (37.1 C) 98 F (36.7 C)  97.9 F (36.6 C)  TempSrc: Oral Axillary  Oral  SpO2: 100%     Weight:      Height:       General:  AAO x 3,  cooperative, no distress;   HEENT:  Normocephalic, PERRL, otherwise with in Normal limits   Neuro:  CNII-XII intact. , normal motor and sensation, reflexes intact   Lungs:   Clear to auscultation BL, Respirations unlabored,  No wheezes / crackles  Cardio:    S1/S2, RRR, No murmure, No Rubs or Gallops   Abdomen:  Soft, non-tender, bowel sounds active all four quadrants, no guarding or peritoneal signs.  Muscular  skeletal:  Limited exam -global generalized weaknesses - in bed, able to move all 4 extremities,   2+ pulses,  symmetric, No pitting edema  Skin:  Dry, warm to touch, negative for any Rashes,  Wounds: Please see nursing documentation     Data Reviewed:     Latest Ref Rng & Units 08/07/2023    4:30 AM 08/06/2023    4:11 PM 06/03/2023   10:29 AM  CBC  WBC 4.0 - 10.5 K/uL 7.3  9.0  8.3   Hemoglobin 12.0 - 15.0 g/dL 89.8  9.7  9.4   Hematocrit 36.0 - 46.0 % 32.5  31.5  28.5   Platelets 150 - 400 K/uL 113  140  187       Latest Ref Rng & Units 08/09/2023    4:22 AM 08/08/2023    8:38 AM 08/07/2023    4:30 AM  BMP  Glucose 70 - 99 mg/dL 834  790  893   BUN 8 - 23 mg/dL 33  30  30   Creatinine 0.44 - 1.00 mg/dL 8.48  8.70  8.39   Sodium 135 - 145 mmol/L 142  138  138   Potassium 3.5 - 5.1 mmol/L 3.2  3.6  4.1   Chloride 98 - 111 mmol/L 110  110  108   CO2 22 - 32 mmol/L 20  19  20    Calcium  8.9 - 10.3 mg/dL 7.9  8.1  8.5    ECHOCARDIOGRAM COMPLETE Result Date: 08/08/2023    ECHOCARDIOGRAM REPORT   Patient Name:   Ebony Scott Date of                  FINDINGS  Left Ventricle: Left ventricular ejection fraction, by estimation, is 25 to 30%. The left ventricle has severely decreased function. The left ventricle  demonstrates regional wall motion abnormalities. The left ventricular internal cavity size was normal  in size. There is moderate asymmetric left ventricular hypertrophy of the basal-septal segment. Left ventricular diastolic parameters are consistent with Grade I diastolic dysfunction (impaired relaxation).  LV Wall Scoring: The entire anterior septum, mid and distal inferior wall, mid inferoseptal segment, apical anterior segment, and apex are akinetic. Right Ventricle: The right ventricular size is normal. No increase in right ventricular wall thickness. Right ventricular systolic function is normal. There is normal pulmonary artery systolic pressure. The tricuspid regurgitant velocity is 2.33 m/s, and  with an assumed right atrial pressure of 3 mmHg, the estimated right ventricular systolic pressure is  24.7 mmHg. Left Atrium: Left atrial size was moderately dilated. Right Atrium: Right atrial size was normal in size. Pericardium: There is no evidence of pericardial effusion. Mitral Valve: The mitral valve is normal in structure. No evidence of mitral valve regurgitation. No evidence of mitral valve stenosis. Tricuspid Valve: The tricuspid valve is normal in structure. Tricuspid valve regurgitation is trivial. No evidence of tricuspid stenosis. Aortic Valve: The aortic valve is tricuspid. There is mild calcification of the aortic valve. Aortic valve regurgitation is trivial. Aortic regurgitation PHT measures 460 msec. No aortic stenosis is present. Aortic valve mean gradient measures 12.0 mmHg.  Aortic valve peak gradient measures 20.8 mmHg. Aortic valve area, by VTI measures 0.75 cm. Pulmonic Valve: The pulmonic valve was normal in structure. Pulmonic valve regurgitation is not visualized. No evidence of pulmonic stenosis. Aorta: The aortic root is normal in size and structure. Venous: The inferior vena cava is normal in size with greater than 50% respiratory variability, suggesting right atrial pressure of 3  mmHg. IAS/Shunts: No atrial level shunt detected by color flow Doppler. Additional Comments:    Family Communication: Discussed with patient, she understand and agree. All questions answered.  Disposition: Status is: changed to Inpatient Remains inpatient appropriate because: IV antibiotics for bacteremia, weakness, Transfer to [facility] for TEE, EP consult.  Planned Discharge Destination: Home with Home Health     Time spent: 50 minutes  Author:  SIGNED: Adriana DELENA Grams, MD, FHM. FAAFP [facility],  Pager (please use [email or web address] to page/text)  Please use Epic Secure Chat for non-urgent communication (7AM-7PM) If 7PM-7AM, please contact night-coverage [email or web address],  08/11/2023, 12:10 PM

## 2023-08-12 ENCOUNTER — Inpatient Hospital Stay (HOSPITAL_COMMUNITY)

## 2023-08-12 ENCOUNTER — Encounter (HOSPITAL_COMMUNITY)
Admission: EM | Disposition: A | Discharge status: Skilled Nursing Facility | Payer: Self-pay | Source: Home / Self Care | Attending: Internal Medicine

## 2023-08-12 ENCOUNTER — Encounter (HOSPITAL_COMMUNITY): Payer: Self-pay | Admitting: Internal Medicine

## 2023-08-12 DIAGNOSIS — J189 Pneumonia, unspecified organism: Secondary | ICD-10-CM | POA: Diagnosis not present

## 2023-08-12 DIAGNOSIS — G4733 Obstructive sleep apnea (adult) (pediatric): Secondary | ICD-10-CM

## 2023-08-12 DIAGNOSIS — I38 Endocarditis, valve unspecified: Secondary | ICD-10-CM | POA: Diagnosis not present

## 2023-08-12 DIAGNOSIS — I2510 Atherosclerotic heart disease of native coronary artery without angina pectoris: Secondary | ICD-10-CM | POA: Diagnosis not present

## 2023-08-12 DIAGNOSIS — R7881 Bacteremia: Secondary | ICD-10-CM

## 2023-08-12 DIAGNOSIS — J45909 Unspecified asthma, uncomplicated: Secondary | ICD-10-CM

## 2023-08-12 DIAGNOSIS — I10 Essential (primary) hypertension: Secondary | ICD-10-CM

## 2023-08-12 HISTORY — PX: TRANSESOPHAGEAL ECHOCARDIOGRAM (CATH LAB): EP1270

## 2023-08-12 LAB — COMPREHENSIVE METABOLIC PANEL WITH GFR
ALT: 32 U/L (ref 0–44)
AST: 44 U/L — ABNORMAL HIGH (ref 15–41)
Albumin: 2.4 g/dL — ABNORMAL LOW (ref 3.5–5.0)
Alkaline Phosphatase: 29 U/L — ABNORMAL LOW (ref 38–126)
Anion gap: 9 (ref 5–15)
BUN: 17 mg/dL (ref 8–23)
CO2: 20 mmol/L — ABNORMAL LOW (ref 22–32)
Calcium: 8.1 mg/dL — ABNORMAL LOW (ref 8.9–10.3)
Chloride: 111 mmol/L (ref 98–111)
Creatinine, Ser: 1.36 mg/dL — ABNORMAL HIGH (ref 0.44–1.00)
GFR, Estimated: 41 mL/min — ABNORMAL LOW (ref >60)
Glucose, Bld: 82 mg/dL (ref 70–99)
Potassium: 3.7 mmol/L (ref 3.5–5.1)
Sodium: 140 mmol/L (ref 135–145)
Total Bilirubin: 0.6 mg/dL (ref 0.0–1.2)
Total Protein: 5.4 g/dL — ABNORMAL LOW (ref 6.5–8.1)

## 2023-08-12 LAB — CBC WITH DIFFERENTIAL/PLATELET
Abs Immature Granulocytes: 0.12 10*3/uL — ABNORMAL HIGH (ref 0.00–0.07)
Basophils Absolute: 0 10*3/uL (ref 0.0–0.1)
Basophils Relative: 0 %
Eosinophils Absolute: 0.3 10*3/uL (ref 0.0–0.5)
Eosinophils Relative: 2 %
HCT: 28.6 % — ABNORMAL LOW (ref 36.0–46.0)
Hemoglobin: 9 g/dL — ABNORMAL LOW (ref 12.0–15.0)
Immature Granulocytes: 1 %
Lymphocytes Relative: 16 %
Lymphs Abs: 1.9 10*3/uL (ref 0.7–4.0)
MCH: 32.1 pg (ref 26.0–34.0)
MCHC: 31.5 g/dL (ref 30.0–36.0)
MCV: 102.1 fL — ABNORMAL HIGH (ref 80.0–100.0)
Monocytes Absolute: 1.2 10*3/uL — ABNORMAL HIGH (ref 0.1–1.0)
Monocytes Relative: 11 %
Neutro Abs: 8 10*3/uL — ABNORMAL HIGH (ref 1.7–7.7)
Neutrophils Relative %: 70 %
Platelets: 149 10*3/uL — ABNORMAL LOW (ref 150–400)
RBC: 2.8 MIL/uL — ABNORMAL LOW (ref 3.87–5.11)
RDW: 15.7 % — ABNORMAL HIGH (ref 11.5–15.5)
WBC: 11.5 10*3/uL — ABNORMAL HIGH (ref 4.0–10.5)
nRBC: 0 % (ref 0.0–0.2)

## 2023-08-12 LAB — GLUCOSE, CAPILLARY
Glucose-Capillary: 84 mg/dL (ref 70–99)
Glucose-Capillary: 90 mg/dL (ref 70–99)
Glucose-Capillary: 135 mg/dL — ABNORMAL HIGH (ref 70–99)
Glucose-Capillary: 137 mg/dL — ABNORMAL HIGH (ref 70–99)

## 2023-08-12 LAB — ECHO TEE

## 2023-08-12 SURGERY — TRANSESOPHAGEAL ECHOCARDIOGRAM (TEE) (CATHLAB)
Anesthesia: Monitor Anesthesia Care

## 2023-08-12 MED ORDER — PHENYLEPHRINE 80 MCG/ML (10ML) SYRINGE FOR IV PUSH (FOR BLOOD PRESSURE SUPPORT)
PREFILLED_SYRINGE | INTRAVENOUS | Status: DC | PRN
  Administered 2023-08-12: 160 ug via INTRAVENOUS

## 2023-08-12 MED ORDER — SODIUM CHLORIDE 0.9% FLUSH: 3.0000 mL | INTRAVENOUS | Status: DC | PRN

## 2023-08-12 MED ORDER — SODIUM CHLORIDE 0.9% FLUSH: 3.0000 mL | Freq: Two times a day (BID) | INTRAVENOUS | Status: DC

## 2023-08-12 MED ORDER — SODIUM CHLORIDE 0.9 % IV SOLN
INTRAVENOUS | Status: DC | PRN
Start: 2023-08-12 — End: 2023-08-12

## 2023-08-12 MED ORDER — PROPOFOL 10 MG/ML IV BOLUS
INTRAVENOUS | Status: DC | PRN
  Administered 2023-08-12: 30 mg via INTRAVENOUS
  Administered 2023-08-12: 40 mg via INTRAVENOUS
  Administered 2023-08-12: 50 mg via INTRAVENOUS
  Administered 2023-08-12 (×2): 40 mg via INTRAVENOUS

## 2023-08-12 MED ORDER — BENZONATATE 100 MG PO CAPS
200.0000 mg | ORAL_CAPSULE | Freq: Three times a day (TID) | ORAL | Status: DC | PRN
  Administered 2023-08-12 – 2023-08-16 (×3): 200 mg via ORAL
  Filled 2023-08-12 (×3): qty 2

## 2023-08-12 MED ORDER — SODIUM CHLORIDE 0.9 % IV SOLN
2.0000 g | Freq: Three times a day (TID) | INTRAVENOUS | Status: DC
  Administered 2023-08-12 – 2023-08-14 (×6): 2 g via INTRAVENOUS
  Filled 2023-08-12 (×7): qty 2000

## 2023-08-12 NOTE — Progress Notes (Signed)
 Heart Failure Navigator Progress Note  Assessed for Heart & Vascular TOC clinic readiness.  Patient does not meet criteria due to- more chronic CHF, Repeat ECHO pending. NO HF TOC.  Navigator will sign off at this time.   Stephane Haddock, BSN, Scientist, clinical (histocompatibility and immunogenetics) Only

## 2023-08-12 NOTE — Progress Notes (Signed)
 EP following, await results of TEE.     Daphne Barrack, NP-C, AGACNP-BC [facility] - Electrophysiology  08/12/2023, 8:13 AM

## 2023-08-12 NOTE — Transfer of Care (Signed)
 Immediate Anesthesia Transfer of Care Note  Patient: Ebony Scott  Procedure(s) Performed: TRANSESOPHAGEAL ECHOCARDIOGRAM  Patient Location: Cath Lab  Anesthesia Type:MAC  Level of Consciousness: drowsy  Airway & Oxygen  Therapy: Patient Spontanous Breathing and Patient connected to nasal cannula oxygen   Post-op Assessment: Report given to RN and Post -op Vital signs reviewed and stable  Post vital signs: Reviewed and stable  Last Vitals:  Vitals Value Taken Time  BP    Temp    Pulse    Resp    SpO2      Last Pain:  Vitals:   08/12/23 0927  TempSrc:   PainSc: 0-No pain         Complications: No notable events documented.

## 2023-08-12 NOTE — Progress Notes (Signed)
 Progress Note   Patient: Ebony Scott FMW:[id number]  DOB: 1950-10-03            DOA: 08/06/2023     5  DOS: the patient was seen and examined on 08/12/2023   Brief hospital course:  ASA FATH is a 73 y.o. 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 She is admitted for community acquired pneumonia management. Seen by pulmonologist.  =========================================================  Subjective: The patient was seen and examined this morning, stable no acute distress. Status post TEE-tolerated well  Discussed with the patient regarding no vegetation.   ------------------------------------------------------------------------------------------------------ Assessment and Plan:  Sepsis due to Community-Pneumonia, recurrent pneumonitis -Much improved, hemodynamically stable now -satting 95% on 3 L of oxygen   History of immunosuppression s/p recurrent pneumonias Continue Azithro  . Pulmonology-team are following ID: Following-recommending antibiotics and duration  Mucinex  DM twice daily. Encourage incentive spirometry, out of bed to chair. Supplemental O2 as needed.  Enterococcus bacteremia - Remained hemodynamically stable POA:  fever, tachycardia, lactic acid  normal. Blood cultures positive for enterococcus fecalis.  ID on board advised ampicillin  therapy, TEE, EP consult. Tolerated ampicillin  challenge (has penicillin allergy ).  MRSA screen negative, stopped Vanco. Transfer to [facility] for TEE  08/12/2023  -reported no thrombus; no vegetation noted on RV lead; trace AI, MR and TR; no valvular vegetations.    AKI on CKD 3B Creatinine now at baseline of 1.3-1.5, got gentle IV fluids. --Creatinine elevated x 1.5, today 1.36 Avoid nephrotoxic meds.   Chronic hypoxic respiratory failure / Bronchiectasis/ Asthma At baseline patient uses 2-3 L [place] at night-currently on 3 L, satting 95% Continue home bronchodilators Continue as needed DuoNebs.  Pulmonology consulted -follow-up appreciated   HFrEF s/p ICD. Last TTE in May 2024 showed EF 30-35%, echo done this admission no change in EF. No vegetations. EP consult at [facility] for possible explantation of ICD.  Seropositive RA - Chronic steroid use Restarted home dose 7 mg of prednisone  daily after she got stress dose iv steroids. Scheduled to follow-up with rheumatology on 6/25   Hypogammaglobulinemia IgG, IgA and IgM levels within normal limit 2 months ago Continue IVIG as outpatient   Type 2 diabetes mellitus Last A1c 6.0% 8 months ago Continue sliding scale with insulin  given steroid use.  Hx of PE/DVT Chronic and stable Continue Xarelto    Chronic pain syndrome - Resumed gabapentin .   Hypokalemia -monitoring and replaced with magnesium    generalized weakness Encourage out of bed to chair. PT/OT advised HH services.-In next 24 hours    Out of bed to chair. Incentive spirometry. Nursing supportive care. Fall, aspiration precautions. Diet: Will resume diet as tolerated  DVT prophylaxis:  rivaroxaban  (XARELTO ) tablet 20 mg  Level of care: Telemetry   Code Status: Limited: Do not attempt resuscitation (DNR) -DNR-LIMITED -Do Not Intubate/DNI     Physical Exam: Vitals:   08/12/23 1030 08/12/23 1040 08/12/23 1044 08/12/23 1050  BP: (!) 108/53  133/62 137/63  Pulse: 61 63 64 65  Resp: (!) 28 (!) 9 16 16   Temp:      TempSrc:  SpO2: 97% 95% 96% 95%  Weight:      Height:            General:  AAO x 3,   cooperative, no distress;   HEENT:  Normocephalic, PERRL, otherwise with in Normal limits   Neuro:  CNII-XII intact. , normal motor and sensation, reflexes intact   Lungs:   Clear to auscultation BL, Respirations unlabored,  No wheezes / crackles  Cardio:    S1/S2, RRR, No murmure, No Rubs or Gallops   Abdomen:  Soft, non-tender, bowel sounds active all four quadrants, no guarding or peritoneal signs.  Muscular  skeletal:  Limited exam -global generalized weaknesses - in bed, able to move all 4 extremities,   2+ pulses,  symmetric, No pitting edema  Skin:  Dry, warm to touch, negative for any Rashes,  Wounds: Please see nursing documentation         Data Reviewed:     Latest Ref Rng & Units 08/12/2023    6:57 AM 08/07/2023    4:30 AM 08/06/2023    4:11 PM  CBC  WBC 4.0 - 10.5 K/uL 11.5  7.3  9.0   Hemoglobin 12.0 - 15.0 g/dL 9.0  89.8  9.7   Hematocrit 36.0 - 46.0 % 28.6  32.5  31.5   Platelets 150 - 400 K/uL 149  113  140       Latest Ref Rng & Units 08/12/2023    6:57 AM 08/10/2023    6:21 AM 08/09/2023    4:22 AM  BMP  Glucose 70 - 99 mg/dL 82  92  834   BUN 8 - 23 mg/dL 17  25  33   Creatinine 0.44 - 1.00 mg/dL 8.63  8.81  8.48   Sodium 135 - 145 mmol/L 140  142  142   Potassium 3.5 - 5.1 mmol/L 3.7  3.3  3.2   Chloride 98 - 111 mmol/L 111  114  110   CO2 22 - 32 mmol/L 20  20  20    Calcium  8.9 - 10.3 mg/dL 8.1  7.8  7.9    ECHOCARDIOGRAM COMPLETE Result Date: 08/08/2023    ECHOCARDIOGRAM REPORT   Patient Name:   [name] Voiles Date of                  FINDINGS  Left Ventricle: Left ventricular ejection fraction, by estimation, is 25 to 30%. The left ventricle has severely decreased function. The left ventricle demonstrates regional wall motion abnormalities. The left ventricular internal cavity size was normal  in size. There is moderate asymmetric left ventricular hypertrophy of the basal-septal segment. Left ventricular diastolic parameters are consistent with Grade I  diastolic dysfunction (impaired relaxation).  LV Wall Scoring: The entire anterior septum, mid and distal inferior wall, mid inferoseptal segment, apical anterior segment, and apex are akinetic. Right Ventricle: The right ventricular size is normal. No increase in right ventricular wall thickness. Right ventricular systolic function is normal. There is normal pulmonary artery systolic pressure. The tricuspid regurgitant velocity is 2.33 m/s, and  with an assumed right atrial pressure of 3 mmHg, the estimated right ventricular systolic pressure is 24.7 mmHg. Left Atrium: Left atrial size was moderately dilated. Right Atrium: Right atrial size was normal in size. Pericardium: There is no evidence of pericardial effusion. Mitral Valve: The mitral valve is normal in structure. No evidence of mitral valve regurgitation. No evidence of mitral valve stenosis. Tricuspid Valve: The tricuspid valve is normal in structure.  Tricuspid valve regurgitation is trivial. No evidence of tricuspid stenosis. Aortic Valve: The aortic valve is tricuspid. There is mild calcification of the aortic valve. Aortic valve regurgitation is trivial. Aortic regurgitation PHT measures 460 msec. No aortic stenosis is present. Aortic valve mean gradient measures 12.0 mmHg.  Aortic valve peak gradient measures 20.8 mmHg. Aortic valve area, by VTI measures 0.75 cm. Pulmonic Valve: The pulmonic valve was normal in structure. Pulmonic valve regurgitation is not visualized. No evidence of pulmonic stenosis. Aorta: The aortic root is normal in size and structure. Venous: The inferior vena cava is normal in size with greater than 50% respiratory variability, suggesting right atrial pressure of 3 mmHg. IAS/Shunts: No atrial level shunt detected by color flow Doppler. Additional Comments:    Family Communication: Discussed with patient, she understand and agree. All questions answered.  Disposition: Status is: changed to Inpatient Remains inpatient  appropriate because: IV antibiotics for bacteremia, weakness, Transfer to [facility] cone for TEE, EP consult.  Planned Discharge Destination: Home with Home Health in 1-2 days     Time spent: 50 minutes  Author:  SIGNED: Adriana DELENA Grams, MD, FHM. FAAFP [facility],  Pager (please use [email or web address] to page/text)  Please use Epic Secure Chat for non-urgent communication (7AM-7PM) If 7PM-7AM, please contact night-coverage [email or web address],  08/12/2023, 11:41 AM

## 2023-08-12 NOTE — H&P (View-Only) (Signed)
 EP following, await results of TEE.     Daphne Barrack, NP-C, AGACNP-BC [facility] - Electrophysiology  08/12/2023, 8:13 AM

## 2023-08-12 NOTE — Progress Notes (Signed)
 Physical Therapy Treatment Patient Details Name: Ebony Scott MRN: [id number] DOB: 1950-04-24 Today's Date: 08/12/2023   History of Present Illness 73 yo female admitted with Pna. Hx of RA-chronic prednision, HF, CAD, chronic resp failure-O2 at night, anemia, chronic pain, fibromyalgia, osteomyelitis, DVT, osteoporosis, breast Ca, CKD, DM    PT Comments  Pt supine in bed with IV antibiotics started. Reluctantly, agreeable to short distance ambulation. Pt is limited in safe mobility by increased work of breathing, but is able to maintain SpO2 >94%O2 on RA.  Pt is mod I for bed mobility and supervision for transfers and ambulation. Pt looking forward to discharge home hopefully tomorrow. PT will continue to follow acutely.    If plan is discharge home, recommend the following: A little help with walking and/or transfers;A little help with bathing/dressing/bathroom;Assistance with cooking/housework;Assist for transportation;Help with stairs or ramp for entrance   Can travel by private vehicle      Yes  Equipment Recommendations  None recommended by PT    Recommendations for Other Services       Precautions / Restrictions Precautions Precautions: Fall Precaution/Restrictions Comments: oxygen  at night only at baseline Restrictions Weight Bearing Restrictions Per Provider Order: No Other Position/Activity Restrictions: she reports having chronic pain in her feet     Mobility  Bed Mobility Overal bed mobility: Modified Independent Bed Mobility: Supine to Sit, Sit to Supine     Supine to sit: Modified independent (Device/Increase time) Sit to supine: Modified independent (Device/Increase time)        Transfers Overall transfer level: Needs assistance Equipment used: Rolling walker (2 wheels) Transfers: Sit to/from Stand Sit to Stand: Supervision           General transfer comment: min cues    Ambulation/Gait Ambulation/Gait assistance: Supervision Gait Distance (Feet):  150 Feet Assistive device: Rolling walker (2 wheels) Gait Pattern/deviations: Step-through pattern Gait velocity: decreased     General Gait Details: continued cuing for upright posture for improved breathing.         Balance Overall balance assessment: Needs assistance Sitting-balance support: No upper extremity supported, Feet unsupported Sitting balance-Leahy Scale: Good     Standing balance support: Bilateral upper extremity supported, During functional activity, Reliant on assistive device for balance Standing balance-Leahy Scale: Fair Standing balance comment: static standing no UE support                            Communication Communication Communication: No apparent difficulties  Cognition Arousal: Alert Behavior During Therapy: WFL for tasks assessed/performed   PT - Cognitive impairments: No apparent impairments                         Following commands: Intact      Cueing Cueing Techniques: Verbal cues     General Comments General comments (skin integrity, edema, etc.): VSS on RA SpO2 >94%O2 with ambulation      Pertinent Vitals/Pain Pain Assessment Pain Assessment: 0-10 Pain Score: 4  Pain Location: feet Pain Descriptors / Indicators: Discomfort, Aching, Constant     PT Goals (current goals can now be found in the care plan section) Acute Rehab PT Goals Patient Stated Goal: none stated PT Goal Formulation: With patient Time For Goal Achievement: 08/21/23 Potential to Achieve Goals: Good Progress towards PT goals: Progressing toward goals    Frequency    Min 3X/week       AM-PAC PT 6 Clicks Mobility  Outcome Measure  Help needed turning from your back to your side while in a flat bed without using bedrails?: None Help needed moving from lying on your back to sitting on the side of a flat bed without using bedrails?: None Help needed moving to and from a bed to a chair (including a wheelchair)?: A Little Help  needed standing up from a chair using your arms (e.g., wheelchair or bedside chair)?: A Little Help needed to walk in hospital room?: A Little Help needed climbing 3-5 steps with a railing? : A Little 6 Click Score: 20    End of Session Equipment Utilized During Treatment: Gait belt Activity Tolerance: Patient tolerated treatment well Patient left: with call bell/phone within reach;in bed Nurse Communication: Mobility status PT Visit Diagnosis: Difficulty in walking, not elsewhere classified (R26.2);Muscle weakness (generalized) (M62.81)     Time: 8471-8455 PT Time Calculation (min) (ACUTE ONLY): 16 min  Charges:    $Gait Training: 8-22 mins PT General Charges $$ ACUTE PT VISIT: 1 Visit                     Zakaria Fromer B. Fleeta Lapidus PT, DPT Acute Rehabilitation Services Please use secure chat or  Call Office [phone]    Ebony Scott - [facility] 08/12/2023, 4:44 PM

## 2023-08-12 NOTE — Anesthesia Preprocedure Evaluation (Addendum)
 Anesthesia Evaluation  Patient identified by MRN, date of birth, ID band Patient awake    Reviewed: Allergy  & Precautions, NPO status , Patient's Chart, lab work & pertinent test results  Airway Mallampati: III  TM Distance: >3 FB Neck ROM: Full  Mouth opening: Limited Mouth Opening  Dental no notable dental hx. (+) Teeth Intact, Dental Advisory Given   Pulmonary asthma , sleep apnea (2L O2 at night) and Oxygen  sleep apnea , PE   Pulmonary exam normal breath sounds clear to auscultation       Cardiovascular + CAD, +CHF and + DVT  Normal cardiovascular exam+ Cardiac Defibrillator  Rhythm:Regular Rate:Normal  TTE 2025  1. Left ventricular ejection fraction, by estimation, is 25 to 30%. The  left ventricle has severely decreased function. The left ventricle  demonstrates regional wall motion abnormalities (see scoring  diagram/findings for description). There is moderate  asymmetric left ventricular hypertrophy of the basal-septal segment. Left  ventricular diastolic parameters are consistent with Grade I diastolic  dysfunction (impaired relaxation).   2. Right ventricular systolic function is normal. The right ventricular  size is normal. There is normal pulmonary artery systolic pressure.   3. Left atrial size was moderately dilated.   4. The mitral valve is normal in structure. No evidence of mitral valve  regurgitation. No evidence of mitral stenosis.   5. The aortic valve is tricuspid. There is mild calcification of the  aortic valve. Aortic valve regurgitation is trivial. No aortic stenosis is  present.   6. The inferior vena cava is normal in size with greater than 50%  respiratory variability, suggesting right atrial pressure of 3 mmHg.     Neuro/Psych  Headaches PSYCHIATRIC DISORDERS  Depression       GI/Hepatic Neg liver ROS, hiatal hernia,GERD  ,,  Endo/Other  diabetesHypothyroidism    Renal/GU Renal  InsufficiencyRenal diseaseLab Results      Component                Value               Date                      NA                       140                 08/12/2023                CL                       111                 08/12/2023                K                        3.7                 08/12/2023                CO2                      20 (L)              08/12/2023  BUN                      17                  08/12/2023                CREATININE               1.36 (H)            08/12/2023                GFRNONAA                 41 (L)              08/12/2023                CALCIUM                   8.1 (L)             08/12/2023                PHOS                     3.1                 05/24/2023                ALBUMIN                  2.4 (L)             08/12/2023                GLUCOSE                  82                  08/12/2023             negative genitourinary   Musculoskeletal  (+) Arthritis ,  Fibromyalgia -  Abdominal   Peds  Hematology  (+) Blood dyscrasia (plavix , xarelto ), anemia Lab Results      Component                Value               Date                      WBC                      11.5 (H)            08/12/2023                HGB                      9.0 (L)             08/12/2023                HCT                      28.6 (L)            08/12/2023                MCV  102.1 (H)           08/12/2023                PLT                      149 (L)             08/12/2023              Anesthesia Other Findings 73 y.o. 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 She is admitted for community acquired pneumonia management. Now with Enterococcus bacteremia  Reproductive/Obstetrics                             Anesthesia Physical Anesthesia Plan  ASA: 4  Anesthesia Plan: MAC   Post-op Pain Management:    Induction: Intravenous  PONV Risk Score and Plan: Propofol  infusion and Treatment may vary due to age or medical condition  Airway Management Planned: Natural Airway  Additional Equipment:   Intra-op Plan:   Post-operative Plan:   Informed Consent: I have reviewed the patients History and Physical, chart, labs and discussed the procedure including the risks, benefits and alternatives for the proposed anesthesia with the patient or authorized representative who has indicated his/her understanding and acceptance.   Patient has DNR.  Discussed DNR with patient and Suspend DNR.   Dental advisory given  Plan Discussed with: CRNA  Anesthesia Plan Comments:        Anesthesia Quick Evaluation

## 2023-08-12 NOTE — Progress Notes (Signed)
   [facility] has been requested to perform a transesophageal echocardiogram on Ebony Scott for bacteremia.  After careful review of history and examination, the risks and benefits of transesophageal echocardiogram have been explained including risks of esophageal damage, perforation (1:10,000 risk), bleeding, pharyngeal hematoma as well as other potential complications associated with anesthesia including aspiration, arrhythmia, respiratory failure and death. Alternatives to treatment were discussed, questions were answered. Patient is willing to proceed.   73 yo female with PMH of CAD s/p PCI of LAD, ischemic cardiomyopathy s/p ICD and history of tracheobronchomalacia (was able to undergo endoscopy on 02/08/2021) who presented with E Faecalis bacteremia. EP is waiting on TEE to decide if need ICD extraction.   Obdulia Steier, [place] 08/12/2023 8:21 AM

## 2023-08-12 NOTE — Progress Notes (Signed)
 PHARMACY NOTE:  ANTIMICROBIAL RENAL DOSAGE ADJUSTMENT  Current antimicrobial regimen includes a mismatch between antimicrobial dosage and estimated renal function.  As per policy approved by the Pharmacy & Therapeutics and Medical Executive Committees, the antimicrobial dosage will be adjusted accordingly.  Current antimicrobial dosage:  ampicillin  2g q6h   Indication: bacteremia  Renal Function:  Estimated Creatinine Clearance: 28.3 mL/min (A) (by C-G formula based on SCr of 1.36 mg/dL (H)). []      On intermittent HD, scheduled: []      On CRRT    Antimicrobial dosage has been changed to:  q8h  Additional comments:   Thank you for allowing pharmacy to be a part of this patient's care.  Ozell ONEIDA [place], [facility] 08/12/2023 11:26 AM

## 2023-08-12 NOTE — Interval H&P Note (Signed)
 History and Physical Interval Note:  08/12/2023 9:08 AM  Ebony Scott  has presented today for surgery, with the diagnosis of bacteremia.  The various methods of treatment have been discussed with the patient and family. After consideration of risks, benefits and other options for treatment, the patient has consented to  Procedure(s): TRANSESOPHAGEAL ECHOCARDIOGRAM (N/A) as a surgical intervention.  The patient's history has been reviewed, patient examined, no change in status, stable for surgery.  I have reviewed the patient's chart and labs.  Questions were answered to the patient's satisfaction.     Redell Shallow

## 2023-08-12 NOTE — TOC Progression Note (Addendum)
 Transition of Care [facility]) - Progression Note    Patient Details  Name: Ebony Scott MRN: [id number] Date of Birth: 1950/11/28  [facility] CM/SW Contact  Bathe Eileen Hoose, RN Phone Number: 08/12/2023, 11:19 AM  Clinical Narrative:  Progression rounds update, pt to have TEE today. IV abx t obe changed to PO. Per provider, if pt is discharged home, will need HH PT/OT/HHA/RN. Per [place], pt last used [facility] for [facility] services. Message to [facility] with [facility] to confirm. Awaiting response. Possible discharge tomorrow or Wednesday.  1154: Message from [place] with [facility]. Patient services ended 6/11. She will need a new orders to restart care again.  1214: New HH orders noted, Lynette with [facility] made aware.    Expected Discharge Plan: Home/Self Care Barriers to Discharge: Continued Medical Work up  Expected Discharge Plan and Services In-house Referral: NA Discharge Planning Services: CM Consult Post Acute Care Choice: NA Living arrangements for the past 2 months: Single Family Home                 DME Arranged: N/A DME Agency: NA       HH Arranged: NA HH Agency: NA         Social Determinants of Health (SDOH) Interventions SDOH Screenings   Food Insecurity: No Food Insecurity (08/07/2023)  Housing: Low Risk  (08/07/2023)  Transportation Needs: No Transportation Needs (08/07/2023)  Utilities: Not At Risk (08/07/2023)  Alcohol  Screen: Low Risk  (11/22/2022)  Depression (PHQ2-9): Low Risk  (05/29/2023)  Financial Resource Strain: Low Risk  (03/26/2023)  Physical Activity: Inactive (03/26/2023)  Social Connections: Socially Isolated (08/07/2023)  Stress: Stress Concern Present (03/26/2023)  Tobacco Use: Low Risk  (08/12/2023)  Health Literacy: Adequate Health Literacy (11/22/2022)    Readmission Risk Interventions    05/20/2023   12:38 PM 12/06/2022    2:19 PM 12/03/2022    3:04 PM  Readmission Risk Prevention Plan  Transportation Screening Complete Complete  Complete  PCP or Specialist Appt within 3-5 Days  Complete Complete  HRI or Home Care Consult  Complete Complete  Social Work Consult for Recovery Care Planning/Counseling  Complete Complete  Palliative Care Screening  Complete Complete  Medication Review Oceanographer) Complete Complete Complete  PCP or Specialist appointment within 3-5 days of discharge Complete    HRI or Home Care Consult Complete    SW Recovery Care/Counseling Consult Complete    Palliative Care Screening Not Applicable    Skilled Nursing Facility Complete

## 2023-08-12 NOTE — Progress Notes (Signed)
     Transesophageal Echocardiogram Note  Ebony Scott [id number] 1950/12/28  Procedure: Transesophageal Echocardiogram Indications: Bacteremia  Procedure Details Consent: Obtained Time Out: Verified patient identification, verified procedure, site/side was marked, verified correct patient position, special equipment/implants available, Radiology Safety Procedures followed,  medications/allergies/relevent history reviewed, required imaging and test results available.  Performed  Medications:  Pt sedated by anesthesia with diprovan 200 mg IV total.  Akinesis of the distal anteroseptal wall and apex with overall severe LV dysfunction; moderate LAE with no thrombus; no vegetation noted on RV lead; trace AI, MR and TR; no valvular vegetations.    Complications: No apparent complications Patient did tolerate procedure well.  Redell Shallow, MD

## 2023-08-12 NOTE — Progress Notes (Signed)
 PT Cancellation Note  Patient Details Name: Ebony Scott MRN: [id number] DOB: 09-Sep-1950   Cancelled Treatment:    Reason Eval/Treat Not Completed: (P) Patient at procedure or test/unavailable Pt is off floor for TEE. PT will follow back for treatment this afternoon as able.  Marypat Kimmet B. Fleeta Lapidus PT, DPT Acute Rehabilitation Services Please use secure chat or  Call Office [phone]    Almarie KATHEE Fleeta [facility] 08/12/2023, 9:27 AM

## 2023-08-13 ENCOUNTER — Ambulatory Visit: Admitting: Internal Medicine

## 2023-08-13 DIAGNOSIS — B957 Other staphylococcus as the cause of diseases classified elsewhere: Secondary | ICD-10-CM | POA: Diagnosis not present

## 2023-08-13 DIAGNOSIS — R7881 Bacteremia: Secondary | ICD-10-CM | POA: Diagnosis not present

## 2023-08-13 DIAGNOSIS — T827XXA Infection and inflammatory reaction due to other cardiac and vascular devices, implants and grafts, initial encounter: Secondary | ICD-10-CM | POA: Diagnosis not present

## 2023-08-13 DIAGNOSIS — B952 Enterococcus as the cause of diseases classified elsewhere: Secondary | ICD-10-CM | POA: Diagnosis not present

## 2023-08-13 DIAGNOSIS — J189 Pneumonia, unspecified organism: Secondary | ICD-10-CM | POA: Diagnosis not present

## 2023-08-13 LAB — COMPREHENSIVE METABOLIC PANEL WITH GFR
ALT: 34 U/L (ref 0–44)
AST: 47 U/L — ABNORMAL HIGH (ref 15–41)
Albumin: 2.4 g/dL — ABNORMAL LOW (ref 3.5–5.0)
Alkaline Phosphatase: 34 U/L — ABNORMAL LOW (ref 38–126)
Anion gap: 9 (ref 5–15)
BUN: 19 mg/dL (ref 8–23)
CO2: 24 mmol/L (ref 22–32)
Calcium: 8.6 mg/dL — ABNORMAL LOW (ref 8.9–10.3)
Chloride: 104 mmol/L (ref 98–111)
Creatinine, Ser: 1.59 mg/dL — ABNORMAL HIGH (ref 0.44–1.00)
GFR, Estimated: 34 mL/min — ABNORMAL LOW (ref >60)
Glucose, Bld: 88 mg/dL (ref 70–99)
Potassium: 3.5 mmol/L (ref 3.5–5.1)
Sodium: 137 mmol/L (ref 135–145)
Total Bilirubin: 0.5 mg/dL (ref 0.0–1.2)
Total Protein: 5.8 g/dL — ABNORMAL LOW (ref 6.5–8.1)

## 2023-08-13 LAB — CULTURE, BLOOD (ROUTINE X 2)
Culture: NO GROWTH
Culture: NO GROWTH
Special Requests: ADEQUATE
Special Requests: ADEQUATE

## 2023-08-13 LAB — GLUCOSE, CAPILLARY
Glucose-Capillary: 116 mg/dL — ABNORMAL HIGH (ref 70–99)
Glucose-Capillary: 112 mg/dL — ABNORMAL HIGH (ref 70–99)
Glucose-Capillary: 171 mg/dL — ABNORMAL HIGH (ref 70–99)
Glucose-Capillary: 114 mg/dL — ABNORMAL HIGH (ref 70–99)

## 2023-08-13 MED ORDER — IMMUNE GLOBULIN (HUMAN) 10 GM/50ML ~~LOC~~ SOLN: 15.0000 g | SUBCUTANEOUS | Status: DC

## 2023-08-13 MED ORDER — FLUCONAZOLE 100 MG PO TABS
100.0000 mg | ORAL_TABLET | Freq: Every day | ORAL | Status: DC
  Administered 2023-08-13: 100 mg via ORAL
  Filled 2023-08-13: qty 1

## 2023-08-13 MED ORDER — RIVAROXABAN 15 MG PO TABS
15.0000 mg | ORAL_TABLET | Freq: Every day | ORAL | Status: DC
  Administered 2023-08-13 – 2023-08-15 (×3): 15 mg via ORAL
  Filled 2023-08-13 (×3): qty 1

## 2023-08-13 NOTE — Progress Notes (Signed)
  Patient Name: Ebony Scott Date of Encounter: 08/13/2023  Primary Cardiologist: Shelda Bruckner, MD Electrophysiologist: Eulas FORBES [place], MD  Interval Summary   The patient is doing well today.  Afebrile.   At this time, the patient denies chest pain, shortness of breath, or any new concerns.  Vital Signs    Vitals:   08/12/23 1907 08/12/23 2014 08/13/23 0039 08/13/23 0448  BP: (!) 113/50  (!) 115/50 (!) 137/56  Pulse: 72  67 66  Resp: 16  18 16   Temp: 98.9 F (37.2 C)  98.8 F (37.1 C) 98.4 F (36.9 C)  TempSrc: Oral  Oral Oral  SpO2: 98% 96% 96% 98%  Weight:      Height:        Intake/Output Summary (Last 24 hours) at 08/13/2023 0921 Last data filed at 08/13/2023 0617 Gross per 24 hour  Intake 709.39 ml  Output 550 ml  Net 159.39 ml   Filed Weights   08/07/23 2300 08/09/23 2254  Weight: 59 kg 55.5 kg    Physical Exam    GEN- The patient is well appearing, alert and oriented x 3 today.   Lungs- Clear to ausculation bilaterally, normal work of breathing Cardiac- Regular rate and rhythm, no murmurs, rubs or gallops GI- soft, NT, ND, + BS Extremities- no clubbing or cyanosis. No edema  Telemetry    Sr 60-70's (personally reviewed)  Hospital Course    Ebony Scott is a 73 y.o. female with PMH of ICM / HFrEF s/p ICD admitted 08/06/23 for fever, severe weakness and chills. Work up consistent with E. Faecalis bacteremia.  She was seen by ID and treated with IV abx.  TTE did not show vegetation.  No stigmata of IE symptoms or pocket concerns. Subsequent TEE 08/12/23 showed severe LV dysfunction, no vegetation.   Assessment & Plan    Enterococcus Faecalis Bacteremia  CAP  Hx Chronic Respiratory Failure / Bronchiectasis  Immune Suppression for Sero-Negative RA -admit for PNA / bacteremia, no stigmata of pocket infection or IE  -abx per ID  -negative for vegetation  -will review with Dr. Waddell in regards to timing of ICD removal given no vegetation on  TEE, and would need to optimize clinically before anesthesia given baseline pulmonary disease / risk factors. May be reasonable to treat with abx (follow up repeat cultures), optimize pulmonary status and bring back as outpatient for extraction  -steroids per primary   ICM / HFrEF  -severe LV dysfunction on TEE 08/12/23, LVEF 25-30% -no vegetation on TEE  -GDMT per TRH   Hx PE/DVT  -xarelto  per primary   Hypogammaglobulinemia  -on IVIG as outpatient     For questions or updates, please contact [facility] Please consult [email or web address] for contact info under     Signed, Daphne Barrack, NP-C, AGACNP-BC [facility] - Electrophysiology  08/13/2023, 9:29 AM

## 2023-08-13 NOTE — Anesthesia Postprocedure Evaluation (Signed)
 Anesthesia Post Note  Patient: Ebony Scott  Procedure(s) Performed: TRANSESOPHAGEAL ECHOCARDIOGRAM     Patient location during evaluation: Cath Lab Anesthesia Type: MAC Level of consciousness: awake and alert Pain management: pain level controlled Vital Signs Assessment: post-procedure vital signs reviewed and stable Respiratory status: spontaneous breathing, nonlabored ventilation, respiratory function stable and patient connected to nasal cannula oxygen  Cardiovascular status: blood pressure returned to baseline and stable Postop Assessment: no apparent nausea or vomiting Anesthetic complications: no  No notable events documented.  Last Vitals:  Vitals:   08/13/23 0039 08/13/23 0448  BP: (!) 115/50 (!) 137/56  Pulse: 67 66  Resp: 18 16  Temp: 37.1 C 36.9 C  SpO2: 96% 98%    Last Pain:  Vitals:   08/13/23 0448  TempSrc: Oral  PainSc:    Pain Goal:                   Arlon Bleier L Will Heinkel

## 2023-08-13 NOTE — Progress Notes (Signed)
 Mobility Specialist Progress Note;   08/13/23 1125  Mobility  Activity Ambulated with assistance in hallway  Level of Assistance Contact guard assist, steadying assist  Assistive Device Front wheel walker  Distance Ambulated (ft) 100 ft  Activity Response Tolerated well  Mobility Referral Yes  Mobility visit 1 Mobility  Mobility Specialist Start Time (ACUTE ONLY) 1125  Mobility Specialist Stop Time (ACUTE ONLY) 1136  Mobility Specialist Time Calculation (min) (ACUTE ONLY) 11 min   Pt agreeable to mobility. Required MinG assistance during ambulation for safety. VSS on RA. No rest breaks taken. Pt returned safely back to bed with all needs met, call bell in reach. Alarm on.  Lauraine Erm Mobility Specialist Please contact via SecureChat or [employer] [phone]

## 2023-08-13 NOTE — Progress Notes (Addendum)
 Pt has very strong harsh intermittently productive cough. Has coughing fits which she states is making her chest sore and feel short of breath. PRN neb given per pt request but harsh cough remaining. Provider on call notified for additional cough med (on scheduled Mucinex )

## 2023-08-13 NOTE — Progress Notes (Signed)
 Progress Note   Patient: Ebony Scott FMW:[id number]  DOB: 1950/08/19            DOA: 08/06/2023     6  DOS: the patient was seen and examined on 08/13/2023   Brief hospital course:  Ebony Scott is a 73 y.o. 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 She is admitted for community acquired pneumonia management. Seen by pulmonologist.  =========================================================  Subjective: The patient was seen and examined this morning, stable no acute distress.  Denies any chest pain or shortness of breath-hemodynamically stable, satting 98% on room air   ------------------------------------------------------------------------------------------------------ Assessment and Plan:  Sepsis due to Community-Pneumonia, recurrent pneumonitis-bacteremia With acute respiratory failure - resolved - Much improved, hemodynamically stable on room air satting 98%  History of immunosuppression s/p recurrent pneumonias Pulmonology-team are following ID: Following-recommending antibiotics and duration  Mucinex  DM twice daily.  Encourage incentive spirometry, out of bed to chair.  Has been weaned off supplemental oxygen   Enterococcus bacteremia - Remained hemodynamically stable  POA:  fever, tachycardia, lactic acid  normal. Blood cultures positive for enterococcus fecalis.  ID on board advised ampicillin   Tolerated ampicillin  challenge (has penicillin allergy ).  MRSA screen negative, stopped Vanco. Transfer to [facility]  for TEE  08/12/2023 -reported no thrombus; no vegetation noted on RV lead; trace AI, MR and TR; no valvular vegetations.    -In discussion with cardiology/EP regarding ICD removal - Discussed with ID on secure chat regarding antibiotics duration (They are still concerned about ICD removal)  AKI on CKD 3B Creatinine now at baseline of 1.3-1.5, got gentle IV fluids. --Creatinine elevated x 1.5, today 1.36 Avoid nephrotoxic meds.    Chronic hypoxic respiratory failure / Bronchiectasis/ Asthma -Resolved, much improved on room air 98% At baseline patient uses 2-3 L  at night- Continue home bronchodilators    HFrEF s/p ICD. Last TTE in May 2024 showed EF 30-35%, echo done this admission no change in EF. No vegetations. EP consult at [facility] for possible explantation of ICD.  Seropositive RA - Chronic steroid use Restarted home dose 7 mg of prednisone  daily after she got stress dose iv steroids. Scheduled to follow-up with rheumatology on 6/25   Hypogammaglobulinemia IgG, IgA and IgM levels within normal limit 2 months ago Continue IVIG as outpatient   Type 2 diabetes mellitus Last A1c 6.0% 8 months ago Continue sliding scale with insulin  given steroid use.  Hx of PE/DVT Chronic and stable Continue Xarelto    Chronic pain syndrome - Resumed gabapentin .   Hypokalemia -monitoring and replaced with magnesium    generalized weakness Encourage out of bed to chair. PT/OT advised HH services.-In next 24 hours    Out of bed to chair. Incentive spirometry. Nursing supportive care. Fall, aspiration precautions. Diet: Will resume diet as tolerated  DVT prophylaxis:  rivaroxaban  (XARELTO ) tablet 20 mg  Level of care: Telemetry   Code Status: Limited: Do not attempt resuscitation (DNR) -DNR-LIMITED -Do Not Intubate/DNI     Physical Exam: Vitals:   08/12/23 1907 08/12/23 2014 08/13/23 0039 08/13/23 0448  BP: (!) 113/50  (!) 115/50 ROLLEN)  137/56  Pulse: 72  67 66  Resp: 16   18 16   Temp: 98.9 F (37.2 C)  98.8 F (37.1 C) 98.4 F (36.9 C)  TempSrc: Oral  Oral Oral  SpO2: 98% 96% 96% 98%  Weight:      Height:         General:  AAO x 3,  cooperative, no distress;   HEENT:  Normocephalic, PERRL, otherwise with in Normal limits   Neuro:  CNII-XII intact. , normal motor and sensation, reflexes intact   Lungs:   Clear to auscultation BL, Respirations unlabored,  No wheezes / crackles  Cardio:    S1/S2, RRR, No murmure, No Rubs or Gallops   Abdomen:  Soft, non-tender, bowel sounds active all four quadrants, no guarding or peritoneal signs.  Muscular  skeletal:  Limited exam -global generalized weaknesses - in bed, able to move all 4 extremities,   2+ pulses,  symmetric, No pitting edema  Skin:  Dry, warm to touch, negative for any Rashes,  Wounds: Please see nursing documentation         Data Reviewed:     Latest Ref Rng & Units 08/12/2023    6:57 AM 08/07/2023    4:30 AM 08/06/2023    4:11 PM  CBC  WBC 4.0 - 10.5 K/uL 11.5  7.3  9.0   Hemoglobin 12.0 - 15.0 g/dL 9.0  89.8  9.7   Hematocrit 36.0 - 46.0 % 28.6  32.5  31.5   Platelets 150 - 400 K/uL 149  113  140       Latest Ref Rng & Units 08/13/2023    4:28 AM 08/12/2023    6:57 AM 08/10/2023    6:21 AM  BMP  Glucose 70 - 99 mg/dL 88  82  92   BUN 8 - 23 mg/dL 19  17  25    Creatinine 0.44 - 1.00 mg/dL 8.40  8.63  8.81   Sodium 135 - 145 mmol/L 137  140  142   Potassium 3.5 - 5.1 mmol/L 3.5  3.7  3.3   Chloride 98 - 111 mmol/L 104  111  114   CO2 22 - 32 mmol/L 24  20  20    Calcium  8.9 - 10.3 mg/dL 8.6  8.1  7.8    ECHOCARDIOGRAM COMPLETE Result Date: 08/08/2023    ECHOCARDIOGRAM REPORT   Patient Name:   Ebony Scott Date of                  FINDINGS  Left Ventricle: Left ventricular ejection fraction, by estimation, is 25 to 30%. The left ventricle has severely decreased function. The left ventricle demonstrates regional wall motion abnormalities. The left ventricular internal cavity size  was normal  in size. There is moderate asymmetric left ventricular hypertrophy of the basal-septal segment. Left ventricular diastolic parameters are consistent with Grade I diastolic dysfunction (impaired relaxation).  LV Wall Scoring: The entire anterior septum, mid and distal inferior wall, mid inferoseptal segment, apical anterior segment, and apex are akinetic. Right Ventricle: The right ventricular size is normal. No increase in right ventricular wall thickness. Right ventricular systolic function is normal. There is normal pulmonary artery systolic pressure. The tricuspid regurgitant velocity is 2.33 m/s, and  with an assumed right atrial pressure of 3 mmHg, the estimated right ventricular systolic pressure is 24.7 mmHg. Left Atrium: Left atrial size was moderately dilated. Right Atrium: Right atrial size was normal in size. Pericardium: There is no evidence of  pericardial effusion. Mitral Valve: The mitral valve is normal in structure. No evidence of mitral valve regurgitation. No evidence of mitral valve stenosis. Tricuspid Valve: The tricuspid valve is normal in structure. Tricuspid valve regurgitation is trivial. No evidence of tricuspid stenosis. Aortic Valve: The aortic valve is tricuspid. There is mild calcification of the aortic valve. Aortic valve regurgitation is trivial. Aortic regurgitation PHT measures 460 msec. No aortic stenosis is present. Aortic valve mean gradient measures 12.0 mmHg.  Aortic valve peak gradient measures 20.8 mmHg. Aortic valve area, by VTI measures 0.75 cm. Pulmonic Valve: The pulmonic valve was normal in structure. Pulmonic valve regurgitation is not visualized. No evidence of pulmonic stenosis. Aorta: The aortic root is normal in size and structure. Venous: The inferior vena cava is normal in size with greater than 50% respiratory variability, suggesting right atrial pressure of 3 mmHg. IAS/Shunts: No atrial level shunt detected by color flow Doppler. Additional Comments:     Family Communication: Discussed with patient, she understand and agree. All questions answered.  Disposition: Status is: changed to Inpatient Remains inpatient appropriate because: IV antibiotics for bacteremia, weakness, Transfer to [facility] for TEE, EP consult.  Planned Discharge Destination: Home with Home Health in 1-2 days      Time spent: 50 minutes  Author:  SIGNED: Adriana DELENA Grams, MD, FHM. FAAFP [facility],  Pager (please use [email or web address] to page/text)  Please use Epic Secure Chat for non-urgent communication (7AM-7PM) If 7PM-7AM, please contact night-coverage [email or web address],  08/13/2023, 11:16 AM

## 2023-08-13 NOTE — Progress Notes (Addendum)
 [facility] Infectious Diseases Follow Up Note  Patient Identification: Patient Name: Ebony Scott MRN: [id number] Admit Date: 08/06/2023  3:42 PM Age: 73 y.o.Today's Date: 08/13/2023   Reason for Visit: Bacteremia   Principal Problem:   Community acquired pneumonia Active Problems:   Generalized weakness   AKI (acute kidney injury) (HCC)   Enterococcus faecalis infection   Bacteremia due to Enterococcus   Antibiotics: Ampicillin  6/19-c Ceftriaxone  6/17-c Total days of antibiotics 8  Lines/Hardwares: ICD+, rt shoulder arthroplasty  Interval Events: Remains afebrile, labs remarkable for creatinine 1.59   Assessment 73 year old female with prior history of RA on chronic prednisone , breast cancer in remission, history of C. 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  Initially treated for pneumonia and ID engaged for   # CIED associated E faecalis bacteremia - no GI or GU symptoms  - 6/17 UA negative for infection, strep pneumo, Legionella urine antigen negative - 6/19 blood cx NG in 5 days - 6/19 TTE with no vegetations  - 6/23 TEE no vegetation on RV lead or valves - EP following for possible extraction of cardiac device   # Staph epi in 1/4 blood cx - likely a contaminant   # AKI on CKD - monitor cr  Recommendations - Continue ampicillin  and ceftriaxone  - Follow EP recommendations regarding extract - Anticipate prolonged course of IV antibiotics - Monitor CBC and CMP - Universal/standard isolation precautions  Rest of the management as per the primary team. Thank you for the consult.  Please page with pertinent questions or concerns.  ______________________________________________________________________ Subjective patient seen and examined at the bedside.  No complaints  Past Medical History:  Diagnosis Date   Anemia    Angio-edema    Breast cancer (HCC) 1998   Left breast, in remission   Bronchiectasis (HCC)    CHF (congestive heart failure) (HCC)    Closed displaced fracture of metatarsal bone of right foot 01/20/2019   Closed fracture of fifth metatarsal bone of right foot, initial encounter 01/15/2018   Clostridium difficile colitis 01/14/2018   Coronary artery disease    Depression    some; don't take anything for it (02/20/2012), pt denies dx as of 06/02/21   Diverticulosis    DVT (deep venous thrombosis) (HCC)    Fibromyalgia 11/2011   Fracture of base of fifth metatarsal bone with routine healing, left    GAVE (gastric antral vascular ectasia)    GERD (gastroesophageal reflux disease)    Headache(784.0)    related to allergies; more at different times during the year (02/20/2012)   Hemorrhoids    Hiatal hernia    back and neck   History of blood transfusion 2023   1 unit, 5 units of Iron    Hx of adenomatous colonic polyps 04/12/2016   Hypercholesteremia    good cholesterol is high   Hypothyroidism    h/o Graves disease   IBS (irritable bowel syndrome)    Moderate persistent asthma    -FeV1 72% 2011, -IgE 102 2011, CT sinus Neg 2011   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 Cindy Setter   Sleep apnea    uses cpap nightly   Tracheobronchomalacia    Vasculitis (HCC) 12/13/2017   Past Surgical History:  Procedure Laterality Date   ABDOMINAL HYSTERECTOMY N/A    Phreesia 10/21/2019   ANTERIOR AND POSTERIOR REPAIR  1990's    APPENDECTOMY     ARGON LASER APPLICATION  02/05/2021   Procedure: ARGON LASER APPLICATION;  Surgeon: Leigh Elspeth SQUIBB, MD;  Location: [facility];  Service: Gastroenterology;;   BREAST LUMPECTOMY  1998   left   BREAST SURGERY N/A    Phreesia 10/21/2019   BRONCHIAL WASHINGS  04/05/2020   Procedure: BRONCHIAL WASHINGS;  Surgeon: Kara Dorn NOVAK, MD;  Location: [facility];  Service: Pulmonary;;   CAPSULOTOMY Right 08/28/2021   Procedure: CAPSULOTOMY;  Surgeon: Tobie Franky SQUIBB, DPM;  [facility];  Service: Podiatry;  Laterality: Right;   CARPOMETACARPEL (CMC) FUSION OF THUMB WITH AUTOGRAFT FROM RADIUS  ~ 2009   both thumbs (02/20/2012)   CATARACT EXTRACTION W/ INTRAOCULAR LENS  IMPLANT, BILATERAL  2012   CERVICAL DISCECTOMY  10/2001   C5-C6   CERVICAL FUSION  2003   C3-C4   CHOLECYSTECTOMY     COLONOSCOPY     CORONARY STENT INTERVENTION N/A 01/11/2021   Procedure: CORONARY STENT INTERVENTION;  Surgeon: Anner Alm ORN, MD;  Location: [facility] INVASIVE CV LAB;  Service: Cardiovascular;  Laterality: N/A;   DEBRIDEMENT TENNIS ELBOW  ?1970's   right   ESOPHAGOGASTRODUODENOSCOPY     ESOPHAGOGASTRODUODENOSCOPY (EGD) WITH PROPOFOL  N/A 02/05/2021   Procedure: ESOPHAGOGASTRODUODENOSCOPY (EGD) WITH PROPOFOL ;  Surgeon: Leigh Elspeth SQUIBB, MD;  [facility];  Service: Gastroenterology;  Laterality: N/A;   EYE SURGERY N/A    Phreesia 10/21/2019   HAMMER TOE SURGERY Right 08/28/2021   Procedure: HAMMER TOE REPAIR 2ND TOE RIGHT FOOT;  Surgeon: Tobie Franky SQUIBB, DPM;  [facility];  Service: Podiatry;  Laterality: Right;   HYSTERECTOMY     I & D EXTREMITY Right 09/22/2020   Procedure: IRRIGATION AND DEBRIDEMENT RIGHT HAND AND ELBOW;  Surgeon: Josefina Chew, MD;  [facility]: [facility];  Service: Orthopedics;  Laterality: Right;   ICD IMPLANT N/A 08/27/2022   Procedure: ICD IMPLANT;  Surgeon: Nancey Eulas BRAVO, MD;  [facility];  Service: Cardiovascular;  Laterality: N/A;    KNEE ARTHROPLASTY  ?8009'd   ?right; w/cartilage repair (02/20/2012)   MASS EXCISION Left 08/28/2021   Procedure: EXCISION OF SOFT TISSUE  MASS LEFT FOOT;  Surgeon: Tobie Franky SQUIBB, DPM;  [facility];  Service: Podiatry;  Laterality: Left;   NASAL SEPTUM SURGERY  1980's   POSTERIOR CERVICAL FUSION/FORAMINOTOMY  2004   failed initial fusion; rewired  anterior neck (02/20/2012)   REVERSE SHOULDER ARTHROPLASTY Right 02/16/2020   Procedure: REVERSE SHOULDER ARTHROPLASTY;  Surgeon: Josefina Chew, MD;  [facility];  Service: Orthopedics;  Laterality: Right;   RIGHT/LEFT HEART CATH AND CORONARY ANGIOGRAPHY N/A 01/11/2021   Procedure: RIGHT/LEFT HEART CATH AND CORONARY ANGIOGRAPHY;  Surgeon: Anner Alm ORN, MD;  Location: [facility] INVASIVE CV LAB;  Service: Cardiovascular;  Laterality: N/A;   SPINE SURGERY N/A    Phreesia 10/21/2019   TONSILLECTOMY  ~ 1953   TRANSESOPHAGEAL ECHOCARDIOGRAM (CATH LAB) N/A 08/12/2023   Procedure: TRANSESOPHAGEAL ECHOCARDIOGRAM;  Surgeon: Pietro Redell RAMAN, MD;  Location: [facility] INVASIVE CV LAB;  Service: Cardiovascular;  Laterality: N/A;   VESICOVAGINAL FISTULA CLOSURE W/ TAH  1988  VIDEO BRONCHOSCOPY Bilateral 08/23/2016   Procedure: VIDEO BRONCHOSCOPY WITH FLUORO;  Surgeon: Noreen Tonnie BRAVO, MD;  Location: [facility];  Service: Cardiopulmonary;  Laterality: Bilateral;   VIDEO BRONCHOSCOPY N/A 04/05/2020   Procedure: VIDEO BRONCHOSCOPY WITHOUT FLUORO;  Surgeon: Kara Dorn NOVAK, MD;  [facility];  Service: Pulmonary;  Laterality: N/A;    Vitals BP (!) 137/56 (BP Location: Right Arm)   Pulse 66   Temp 98.4 F (36.9 C) (Oral)   Resp 16   Ht 5' 2 (1.575 m)   Wt 55.5 kg   SpO2 98%   BMI 22.38 kg/m     Physical Exam Constitutional: Adult female lying in the bed    Comments: HEENT WNL, not in acute distress  Cardiovascular:     Rate and Rhythm: Normal rate and regular rhythm.     Heart sounds: s1s2    Pulmonary:     Effort: Pulmonary  effort is normal.     Comments: Normal breath sounds  Abdominal:     Palpations: Abdomen is soft.     Tenderness: Nondistended and nontender  Musculoskeletal:        General: No swelling or tenderness in peripheral joints including right prosthetic shoulder  Skin:    Comments: No rashes, ICD pocket okay with no signs of infection.  Neurological:     General: Awake, alert and oriented, grossly nonfocal skin nonfocal  Psychiatric:        Mood and Affect: Mood normal.   Pertinent Microbiology Results for orders placed or performed during the hospital encounter of 08/06/23  Resp panel by RT-PCR (RSV, Flu A&B, Covid) Anterior Nasal Swab     Status: None   Collection Time: 08/06/23  4:04 PM   Specimen: Anterior Nasal Swab  Result Value Ref Range Status   SARS Coronavirus 2 by RT PCR NEGATIVE NEGATIVE Final    Comment: (NOTE) SARS-CoV-2 target nucleic acids are NOT DETECTED.  The SARS-CoV-2 RNA is generally detectable in upper respiratory specimens during the acute phase of infection. The lowest concentration of SARS-CoV-2 viral copies this assay can detect is 138 copies/mL. A negative result does not preclude SARS-Cov-2 infection and should not be used as the sole basis for treatment or other patient management decisions. A negative result may occur with  improper specimen collection/handling, submission of specimen other than nasopharyngeal swab, presence of viral mutation(s) within the areas targeted by this assay, and inadequate number of viral copies(<138 copies/mL). A negative result must be combined with clinical observations, patient history, and epidemiological information. The expected result is Negative.  Fact Sheet for Patients:  BloggerCourse.com  Fact Sheet for Healthcare Providers:  SeriousBroker.it  This test is no t yet approved or cleared by the [place]  FDA and  has been authorized for detection and/or  diagnosis of SARS-CoV-2 by FDA under an Emergency Use Authorization (EUA). This EUA will remain  in effect (meaning this test can be used) for the duration of the COVID-19 declaration under Section 564(b)(1) of the Act, 21 U.S.C.section 360bbb-3(b)(1), unless the authorization is terminated  or revoked sooner.       Influenza A by PCR NEGATIVE NEGATIVE Final   Influenza B by PCR NEGATIVE NEGATIVE Final    Comment: (NOTE) The Xpert Xpress SARS-CoV-2/FLU/RSV plus assay is intended as an aid in the diagnosis of influenza from Nasopharyngeal swab specimens and should not be used as a sole basis for treatment. Nasal washings and aspirates are unacceptable for Xpert Xpress SARS-CoV-2/FLU/RSV testing.  Fact Sheet  for Patients: BloggerCourse.com  Fact Sheet for Healthcare Providers: SeriousBroker.it  This test is not yet approved or cleared by the [place]  FDA and has been authorized for detection and/or diagnosis of SARS-CoV-2 by FDA under an Emergency Use Authorization (EUA). This EUA will remain in effect (meaning this test can be used) for the duration of the COVID-19 declaration under Section 564(b)(1) of the Act, 21 U.S.C. section 360bbb-3(b)(1), unless the authorization is terminated or revoked.     Resp Syncytial Virus by PCR NEGATIVE NEGATIVE Final    Comment: (NOTE) Fact Sheet for Patients: BloggerCourse.com  Fact Sheet for Healthcare Providers: SeriousBroker.it  This test is not yet approved or cleared by the [place]  FDA and has been authorized for detection and/or diagnosis of SARS-CoV-2 by FDA under an Emergency Use Authorization (EUA). This EUA will remain in effect (meaning this test can be used) for the duration of the COVID-19 declaration under Section 564(b)(1) of the Act, 21 U.S.C. section 360bbb-3(b)(1), unless the authorization is terminated  or revoked.  Performed at [facility], [address] [place], [place] [address]   Blood Culture (routine x 2)     Status: Abnormal   Collection Time: 08/06/23  6:41 PM   Specimen: Site Not Specified; Blood  Result Value Ref Range Status   Specimen Description   Final    SITE NOT SPECIFIED Performed at [facility], [address] [place], [place] [address]    Special Requests   Final    BOTTLES DRAWN AEROBIC ONLY Blood Culture results may not be optimal due to an inadequate volume of blood received in culture bottles Performed at [facility], [address], [place], [place] [address]    Culture  Setup Time   Final    GRAM POSITIVE COCCI IN CHAINS AEROBIC BOTTLE ONLY CRITICAL RESULT CALLED TO, READ BACK BY AND VERIFIED WITH: PHARMD TERRY GREEN ON 08/07/23 @ 1227 BY DRT Performed at [facility], [address], [place], [place] [address]    Culture ENTEROCOCCUS FAECALIS (A)  Final   Report Status 08/09/2023 FINAL  Final   Organism ID, Bacteria ENTEROCOCCUS FAECALIS  Final      Susceptibility   Enterococcus faecalis - MIC*    AMPICILLIN  <=2 SENSITIVE Sensitive     VANCOMYCIN  1 SENSITIVE Sensitive     GENTAMICIN  SYNERGY SENSITIVE Sensitive     * ENTEROCOCCUS FAECALIS  Blood Culture ID Panel (Reflexed)     Status: Abnormal   Collection Time: 08/06/23  6:41 PM  Result Value Ref Range Status   Enterococcus faecalis DETECTED (A) NOT DETECTED Final    Comment: CRITICAL RESULT CALLED TO, READ BACK BY AND VERIFIED WITH: PHARMD TERRY GREEN ON 08/07/23 @ 1231 BY DRT    Enterococcus Faecium NOT DETECTED NOT DETECTED Final   Listeria monocytogenes NOT DETECTED NOT DETECTED Final   Staphylococcus species NOT DETECTED NOT DETECTED Final   Staphylococcus aureus (BCID) NOT DETECTED NOT DETECTED Final   Staphylococcus epidermidis NOT DETECTED NOT DETECTED Final   Staphylococcus lugdunensis NOT DETECTED NOT DETECTED Final    Streptococcus species NOT DETECTED NOT DETECTED Final   Streptococcus agalactiae NOT DETECTED NOT DETECTED Final   Streptococcus pneumoniae NOT DETECTED NOT DETECTED Final   Streptococcus pyogenes NOT DETECTED NOT DETECTED Final   A.calcoaceticus-baumannii NOT DETECTED NOT DETECTED Final   Bacteroides fragilis NOT DETECTED NOT DETECTED Final   Enterobacterales NOT DETECTED NOT DETECTED Final   Enterobacter cloacae complex NOT DETECTED  NOT DETECTED Final   Escherichia coli NOT DETECTED NOT DETECTED Final   Klebsiella aerogenes NOT DETECTED NOT DETECTED Final   Klebsiella oxytoca NOT DETECTED NOT DETECTED Final   Klebsiella pneumoniae NOT DETECTED NOT DETECTED Final   Proteus species NOT DETECTED NOT DETECTED Final   Salmonella species NOT DETECTED NOT DETECTED Final   Serratia marcescens NOT DETECTED NOT DETECTED Final   Haemophilus influenzae NOT DETECTED NOT DETECTED Final   Neisseria meningitidis NOT DETECTED NOT DETECTED Final   Pseudomonas aeruginosa NOT DETECTED NOT DETECTED Final   Stenotrophomonas maltophilia NOT DETECTED NOT DETECTED Final   Candida albicans NOT DETECTED NOT DETECTED Final   Candida auris NOT DETECTED NOT DETECTED Final   Candida glabrata NOT DETECTED NOT DETECTED Final   Candida krusei NOT DETECTED NOT DETECTED Final   Candida parapsilosis NOT DETECTED NOT DETECTED Final   Candida tropicalis NOT DETECTED NOT DETECTED Final   Cryptococcus neoformans/gattii NOT DETECTED NOT DETECTED Final   Vancomycin  resistance NOT DETECTED NOT DETECTED Final    Comment: Performed at [facility], [address] [place], [place] [address]  Respiratory (~20 pathogens) panel by PCR     Status: None   Collection Time: 08/06/23  8:31 PM   Specimen: Nasal Mucosa; Respiratory  Result Value Ref Range Status   Adenovirus NOT DETECTED NOT DETECTED Final   Coronavirus 229E NOT DETECTED NOT DETECTED Final    Comment: (NOTE) The Coronavirus on the Respiratory Panel, DOES NOT  test for the novel  Coronavirus (2019 nCoV)    Coronavirus HKU1 NOT DETECTED NOT DETECTED Final   Coronavirus NL63 NOT DETECTED NOT DETECTED Final   Coronavirus OC43 NOT DETECTED NOT DETECTED Final   Metapneumovirus NOT DETECTED NOT DETECTED Final   Rhinovirus / Enterovirus NOT DETECTED NOT DETECTED Final   Influenza A NOT DETECTED NOT DETECTED Final   Influenza B NOT DETECTED NOT DETECTED Final   Parainfluenza Virus 1 NOT DETECTED NOT DETECTED Final   Parainfluenza Virus 2 NOT DETECTED NOT DETECTED Final   Parainfluenza Virus 3 NOT DETECTED NOT DETECTED Final   Parainfluenza Virus 4 NOT DETECTED NOT DETECTED Final   Respiratory Syncytial Virus NOT DETECTED NOT DETECTED Final   Bordetella pertussis NOT DETECTED NOT DETECTED Final   Bordetella Parapertussis NOT DETECTED NOT DETECTED Final   Chlamydophila pneumoniae NOT DETECTED NOT DETECTED Final   Mycoplasma pneumoniae NOT DETECTED NOT DETECTED Final    Comment: Performed at [facility], [address], [place], [place] [address]  MRSA Next Gen by PCR, Nasal     Status: None   Collection Time: 08/06/23  8:31 PM   Specimen: Nasal Mucosa; Nasal Swab  Result Value Ref Range Status   MRSA by PCR Next Gen NOT DETECTED NOT DETECTED Final    Comment: (NOTE) The GeneXpert MRSA Assay (FDA approved for NASAL specimens only), is one component of a comprehensive MRSA colonization surveillance program. It is not intended to diagnose MRSA infection nor to guide or monitor treatment for MRSA infections. Test performance is not FDA approved in patients less than 27 years old. Performed at [facility], [address] [place], [place] [address]   Blood Culture (routine x 2)     Status: Abnormal   Collection Time: 08/06/23  8:43 PM   Specimen: BLOOD RIGHT HAND  Result Value Ref Range Status   Specimen Description   Final    BLOOD RIGHT HAND Performed at [facility], [address]  [place], [place] [address]     Special Requests   Final    BOTTLES DRAWN AEROBIC AND ANAEROBIC Blood Culture results may not be optimal due to an inadequate volume of blood received in culture bottles Performed at [facility], [address], [place], [place] [address]    Culture  Setup Time   Final    GRAM POSITIVE COCCI IN CHAINS ANAEROBIC BOTTLE ONLY CRITICAL VALUE NOTED.  VALUE IS CONSISTENT WITH PREVIOUSLY REPORTED AND CALLED VALUE. GRAM POSITIVE COCCI IN CLUSTERS AEROBIC BOTTLE ONLY Organism ID to follow    Culture (A)  Final    ENTEROCOCCUS FAECALIS SUSCEPTIBILITIES PERFORMED ON PREVIOUS CULTURE WITHIN THE LAST 5 DAYS. STAPHYLOCOCCUS EPIDERMIDIS THE SIGNIFICANCE OF ISOLATING THIS ORGANISM FROM A SINGLE SET OF BLOOD CULTURES WHEN MULTIPLE SETS ARE DRAWN IS UNCERTAIN. PLEASE NOTIFY THE MICROBIOLOGY DEPARTMENT WITHIN ONE WEEK IF SPECIATION AND SENSITIVITIES ARE REQUIRED. Performed at [facility], [address], [place], [place] [address]    Report Status 08/09/2023 FINAL  Final  Culture, blood (Routine X 2) w Reflex to ID Panel     Status: None (Preliminary result)   Collection Time: 08/08/23  1:11 PM   Specimen: BLOOD RIGHT HAND  Result Value Ref Range Status   Specimen Description BLOOD RIGHT HAND  Final   Special Requests   Final    BOTTLES DRAWN AEROBIC ONLY Blood Culture adequate volume   Culture   Final    NO GROWTH 4 DAYS Performed at [facility], [address], [place], [place] [address]    Report Status PENDING  Incomplete  Culture, blood (Routine X 2) w Reflex to ID Panel     Status: None (Preliminary result)   Collection Time: 08/08/23  1:12 PM   Specimen: BLOOD RIGHT HAND  Result Value Ref Range Status   Specimen Description BLOOD RIGHT HAND  Final   Special Requests   Final    BOTTLES DRAWN AEROBIC ONLY Blood Culture adequate volume   Culture   Final    NO GROWTH 4 DAYS Performed at [facility], [address], [place], [place] [address]     Report Status PENDING  Incomplete   *Note: Due to a large number of results and/or encounters for the requested time period, some results have not been displayed. A complete set of results can be found in Results Review.    Pertinent Lab.    Latest Ref Rng & Units 08/12/2023    6:57 AM 08/07/2023    4:30 AM 08/06/2023    4:11 PM  CBC  WBC 4.0 - 10.5 K/uL 11.5  7.3  9.0   Hemoglobin 12.0 - 15.0 g/dL 9.0  89.8  9.7   Hematocrit 36.0 - 46.0 % 28.6  32.5  31.5   Platelets 150 - 400 K/uL 149  113  140       Latest Ref Rng & Units 08/13/2023    4:28 AM 08/12/2023    6:57 AM 08/10/2023    6:21 AM  CMP  Glucose 70 - 99 mg/dL 88  82  92   BUN 8 - 23 mg/dL 19  17  25    Creatinine 0.44 - 1.00 mg/dL 8.40  8.63  8.81   Sodium 135 - 145 mmol/L 137  140  142   Potassium 3.5 - 5.1 mmol/L 3.5  3.7  3.3   Chloride 98 - 111 mmol/L 104  111  114   CO2 22 - 32  mmol/L 24  20  20    Calcium  8.9 - 10.3 mg/dL 8.6  8.1  7.8   Total Protein 6.5 - 8.1 g/dL 5.8  5.4    Total Bilirubin 0.0 - 1.2 mg/dL 0.5  0.6    Alkaline Phos 38 - 126 U/L 34  29    AST 15 - 41 U/L 47  44    ALT 0 - 44 U/L 34  32       Pertinent Imaging today Plain films and CT images have been personally visualized and interpreted; radiology reports have been reviewed. Decision making incorporated into the Impression /   ECHO TEE Result Date: 08/12/2023    TRANSESOPHOGEAL ECHO REPORT   Patient Name:   ALLESANDRA HUEBSCH Date of Exam: 08/12/2023 Medical Rec #:  [id number]    Height:       62.0 in Accession #:    [id number]   Weight:       122.4 lb Date of Birth:  1950-12-05     BSA:          1.551 m Patient Age:    73 years     BP:           146/61 mmHg Patient Gender: F            HR:           70 bpm. Exam Location:  Inpatient Procedure: Transesophageal Echo, Cardiac Doppler and Color Doppler (Both            Spectral and Color Flow Doppler were utilized during procedure). Indications:    Endocarditis  History:        Patient has prior history of  Echocardiogram examinations, most                 recent 08/08/2023. Risk Factors:Hypertension.  Sonographer:    Philomena Daring Referring Phys: [id number] [facility] PROCEDURE: After discussion of the risks and benefits of a TEE, an informed consent was obtained from the patient. The transesophogeal probe was passed without difficulty through the esophogus of the patient. Imaged were obtained with the patient in a left lateral decubitus position. Sedation performed by different physician. Image quality was good. The patient's vital signs; including heart rate, blood pressure, and oxygen  saturation; remained stable throughout the procedure. The patient developed no  complications during the procedure.  IMPRESSIONS  1. Akinesis of the distal anteroseptal wall and apex with overall severe LV dysfunction; no vegetations noted on RV lead or valves.  2. Left ventricular ejection fraction, by estimation, is 25 to 30%. The left ventricle has severely decreased function. The left ventricle demonstrates regional wall motion abnormalities (see scoring diagram/findings for description). The left ventricular internal cavity size was mildly dilated.  3. Right ventricular systolic function is normal. The right ventricular size is normal.  4. Left atrial size was moderately dilated. No left atrial/left atrial appendage thrombus was detected.  5. The mitral valve is normal in structure. Trivial mitral valve regurgitation.  6. The aortic valve is tricuspid. Aortic valve regurgitation is trivial.  7. There is Moderate (Grade III) plaque involving the descending aorta. Conclusion(s)/Recommendation(s): Normal biventricular function without evidence of hemodynamically significant valvular heart disease. FINDINGS  Left Ventricle: Left ventricular ejection fraction, by estimation, is 25 to 30%. The left ventricle has severely decreased function. The left ventricle demonstrates regional wall motion abnormalities. The left ventricular internal  cavity size was mildly  dilated. Right Ventricle: The right ventricular size is normal. Right  ventricular systolic function is normal. Left Atrium: Left atrial size was moderately dilated. No left atrial/left atrial appendage thrombus was detected. Right Atrium: Right atrial size was normal in size. Pericardium: There is no evidence of pericardial effusion. Mitral Valve: The mitral valve is normal in structure. Trivial mitral valve regurgitation. Tricuspid Valve: The tricuspid valve is normal in structure. Tricuspid valve regurgitation is trivial. Aortic Valve: The aortic valve is tricuspid. Aortic valve regurgitation is trivial. Pulmonic Valve: The pulmonic valve was normal in structure. Pulmonic valve regurgitation is not visualized. No evidence of pulmonic stenosis. Aorta: The aortic root is normal in size and structure. There is moderate (Grade III) plaque involving the descending aorta. IAS/Shunts: No atrial level shunt detected by color flow Doppler. Additional Comments: Akinesis of the distal anteroseptal wall and apex with overall severe LV dysfunction; no vegetations noted on RV lead or valves. A device lead is visualized.  AORTA Ao Root diam: 2.50 cm Ao Asc diam:  2.90 cm Redell Shallow MD Electronically signed by Redell Shallow MD Signature Date/Time: 08/12/2023/12:21:12 PM    Final    EP STUDY Result Date: 08/12/2023 See surgical note for result.  CT CHEST WO CONTRAST Result Date: 08/09/2023 CLINICAL DATA:  Bronchiectasis, recurrent pneumonia EXAM: CT CHEST WITHOUT CONTRAST TECHNIQUE: Multidetector CT imaging of the chest was performed following the standard protocol without IV contrast. RADIATION DOSE REDUCTION: This exam was performed according to the departmental dose-optimization program which includes automated exposure control, adjustment of the mA and/or kV according to patient size and/or use of iterative reconstruction technique. COMPARISON:  05/18/2023 FINDINGS: Cardiovascular: Extensive  multi-vessel coronary artery calcification long segment left anterior descending coronary artery stenting. Left subclavian single lead pacemaker in place with lead within the right ventricle toward the apex. Global cardiac size within normal limits. No pericardial effusion. Central pulmonary arteries are of normal caliber. Moderate atherosclerotic calcification within the thoracic aorta. No aortic aneurysm. Mediastinum/Nodes: No enlarged mediastinal or axillary lymph nodes. Thyroid  gland, trachea, and esophagus demonstrate no significant findings. Lungs/Pleura: Scattered areas of bronchiectasis, architectural distortion, and parenchymal scarring are seen within the right upper lobe, and right lower lobe the areas of previously noted consolidation in keeping with mild post inflammatory scarring. There is persistent mild peribronchial nodularity and ground-glass infiltrate within the right lower lobe in the area prior consolidation in keeping with a small amount of residual inflammatory change however, this is significantly improved since prior examination in keeping with a resolving infectious or inflammatory process. New areas of tree in bud peribronchial nodularity and peripheral consolidation are noted within the medial left upper lobe (41/5) in keeping with an acute infectious or inflammatory process. No pneumothorax or pleural effusion. Upper Abdomen: No acute abnormality. Musculoskeletal: No acute bone abnormality. Postsurgical changes are noted within the left breast. Right total shoulder arthroplasty has been performed. IMPRESSION: 1. New areas of tree-in-bud peribronchial nodularity and peripheral consolidation within the medial left upper lobe in keeping with an acute infectious or inflammatory process. 2. Improving right lower lobe consolidation in keeping with a resolving infectious or inflammatory process. 3. Extensive multi-vessel coronary artery calcification with evidence of long segment left anterior  descending coronary artery stenting. Aortic Atherosclerosis (ICD10-I70.0). Electronically Signed   By: Dorethia Molt M.D.   On: 08/09/2023 00:35   ECHOCARDIOGRAM COMPLETE Result Date: 08/08/2023    ECHOCARDIOGRAM REPORT   Patient Name:   Ebony Scott Date of Exam: 08/08/2023 Medical Rec #:  [id number]    Height:  62.0 in Accession #:    [id number]   Weight:       130.1 lb Date of Birth:  [birth year]     BSA:          1.592 m Patient Age:    73 years     BP:           125/67 mmHg Patient Gender: F            HR:           55 bpm. Exam Location:  Inpatient Procedure: 2D Echo, Intracardiac Opacification Agent, Cardiac Doppler and Color            Doppler (Both Spectral and Color Flow Doppler were utilized during            procedure). Indications:    Endocarditis  History:        Patient has prior history of Echocardiogram examinations, most                 recent 07/17/2022. CHF, CAD, Chronic Kidney Disease; Risk                 Factors:Dyslipidemia.  Sonographer:    Koleen Popper RDCS Referring Phys: [id number] [facility]  Sonographer Comments: Suboptimal apical window. IMPRESSIONS  1. Left ventricular ejection fraction, by estimation, is 25 to 30%. The left ventricle has severely decreased function. The left ventricle demonstrates regional wall motion abnormalities (see scoring diagram/findings for description). There is moderate asymmetric left ventricular hypertrophy of the basal-septal segment. Left ventricular diastolic parameters are consistent with Grade I diastolic dysfunction (impaired relaxation).  2. Right ventricular systolic function is normal. The right ventricular size is normal. There is normal pulmonary artery systolic pressure.  3. Left atrial size was moderately dilated.  4. The mitral valve is normal in structure. No evidence of mitral valve regurgitation. No evidence of mitral stenosis.  5. The aortic valve is tricuspid. There is mild calcification of the aortic valve. Aortic valve  regurgitation is trivial. No aortic stenosis is present.  6. The inferior vena cava is normal in size with greater than 50% respiratory variability, suggesting right atrial pressure of 3 mmHg. FINDINGS  Left Ventricle: Left ventricular ejection fraction, by estimation, is 25 to 30%. The left ventricle has severely decreased function. The left ventricle demonstrates regional wall motion abnormalities. The left ventricular internal cavity size was normal  in size. There is moderate asymmetric left ventricular hypertrophy of the basal-septal segment. Left ventricular diastolic parameters are consistent with Grade I diastolic dysfunction (impaired relaxation).  LV Wall Scoring: The entire anterior septum, mid and distal inferior wall, mid inferoseptal segment, apical anterior segment, and apex are akinetic. Right Ventricle: The right ventricular size is normal. No increase in right ventricular wall thickness. Right ventricular systolic function is normal. There is normal pulmonary artery systolic pressure. The tricuspid regurgitant velocity is 2.33 m/s, and  with an assumed right atrial pressure of 3 mmHg, the estimated right ventricular systolic pressure is 24.7 mmHg. Left Atrium: Left atrial size was moderately dilated. Right Atrium: Right atrial size was normal in size. Pericardium: There is no evidence of pericardial effusion. Mitral Valve: The mitral valve is normal in structure. No evidence of mitral valve regurgitation. No evidence of mitral valve stenosis. Tricuspid Valve: The tricuspid valve is normal in structure. Tricuspid valve regurgitation is trivial. No evidence of tricuspid stenosis. Aortic Valve: The aortic valve is tricuspid. There is mild calcification of the aortic valve. Aortic valve regurgitation  is trivial. Aortic regurgitation PHT measures 460 msec. No aortic stenosis is present. Aortic valve mean gradient measures 12.0 mmHg.  Aortic valve peak gradient measures 20.8 mmHg. Aortic valve area, by  VTI measures 0.75 cm. Pulmonic Valve: The pulmonic valve was normal in structure. Pulmonic valve regurgitation is not visualized. No evidence of pulmonic stenosis. Aorta: The aortic root is normal in size and structure. Venous: The inferior vena cava is normal in size with greater than 50% respiratory variability, suggesting right atrial pressure of 3 mmHg. IAS/Shunts: No atrial level shunt detected by color flow Doppler. Additional Comments: A device lead is visualized.  LEFT VENTRICLE PLAX 2D LVIDd:         5.60 cm   Diastology LVIDs:         3.05 cm   LV e' medial:    4.33 cm/s LV PW:         0.70 cm   LV E/e' medial:  18.7 LV IVS:        1.10 cm   LV e' lateral:   4.28 cm/s LVOT diam:     1.50 cm   LV E/e' lateral: 18.9 LV SV:         42 LV SV Index:   26 LVOT Area:     1.77 cm  RIGHT VENTRICLE            IVC RV S prime:     9.90 cm/s  IVC diam: 1.80 cm LEFT ATRIUM             Index LA diam:        3.30 cm 2.07 cm/m LA Vol (A2C):   47.5 ml 29.83 ml/m LA Vol (A4C):   28.0 ml 17.59 ml/m LA Biplane Vol: 39.5 ml 24.81 ml/m  AORTIC VALVE AV Area (Vmax):    0.76 cm AV Area (Vmean):   0.73 cm AV Area (VTI):     0.75 cm AV Vmax:           228.00 cm/s AV Vmean:          157.000 cm/s AV VTI:            0.557 m AV Peak Grad:      20.8 mmHg AV Mean Grad:      12.0 mmHg LVOT Vmax:         97.50 cm/s LVOT Vmean:        64.500 cm/s LVOT VTI:          0.237 m LVOT/AV VTI ratio: 0.43 AI PHT:            460 msec  AORTA Ao Root diam: 2.70 cm Ao Asc diam:  3.10 cm MITRAL VALVE               TRICUSPID VALVE MV Area (PHT): 3.12 cm    TR Peak grad:   21.7 mmHg MV Decel Time: 243 msec    TR Vmax:        233.00 cm/s MR Peak grad: 64.6 mmHg MR Vmax:      402.00 cm/s  SHUNTS MV E velocity: 81.00 cm/s  Systemic VTI:  0.24 m MV A velocity: 93.80 cm/s  Systemic Diam: 1.50 cm MV E/A ratio:  0.86 Toribio Fuel MD Electronically signed by Toribio Fuel MD Signature Date/Time: 08/08/2023/4:21:57 PM    Final    DG Chest Port 1  View Result Date: 08/06/2023 CLINICAL DATA:  Sepsis. Fatigue and weakness. Altered mental status. Fever. Productive cough. EXAM:  PORTABLE CHEST 1 VIEW COMPARISON:  05/18/2023 FINDINGS: AICD noted. Wrist right shoulder arthroplasty. Postoperative findings in the cervical spine. Atherosclerotic calcification of the aortic arch. Heart size within normal limits. 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 Reticulonodular opacity laterally in the right upper lobe, possibly from atelectasis or atypical infectious bronchiolitis. Lateral calcifications in the left breast. No blunting of the costophrenic angles. IMPRESSION: 1. Reticulonodular opacity laterally in the right upper lobe, possibly from atelectasis or atypical infectious bronchiolitis. 2. AICD. 3. 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 4. Lateral calcifications in the left breast. Electronically Signed   By: Ryan Salvage M.D.   On: 08/06/2023 17:00    I spent 50 minutes involved in face-to-face and non-face-to-face activities for this patient on the day of the visit. Professional time spent includes the following activities: Preparing to see the patient (review of tests), Obtaining and reviewing separately obtained history (admission/discharge record), Performing a medically appropriate examination and evaluation , Ordering medications/labs, referring and communicating with other health care professionals, Documenting clinical information in the EMR, Independently interpreting results (not separately reported), Communicating results to the patient/family/caregiver, Counseling and educating the patient/family/caregiver and Care coordination (not separately reported).   Plan d/w requesting provider as well as ID pharm D  Of note, portions of this note may have been created with voice recognition software. While this note has been edited for accuracy, occasional wrong-word or 'sound-a-like' substitutions may have occurred  due to the inherent limitations of voice recognition software.   Electronically signed by:   Annalee Orem, MD Infectious Disease Physician [facility] Pager: [phone]

## 2023-08-14 ENCOUNTER — Ambulatory Visit: Admitting: Internal Medicine

## 2023-08-14 ENCOUNTER — Other Ambulatory Visit: Payer: Self-pay

## 2023-08-14 DIAGNOSIS — B957 Other staphylococcus as the cause of diseases classified elsewhere: Secondary | ICD-10-CM | POA: Diagnosis not present

## 2023-08-14 DIAGNOSIS — R7881 Bacteremia: Secondary | ICD-10-CM | POA: Diagnosis not present

## 2023-08-14 DIAGNOSIS — M0600 Rheumatoid arthritis without rheumatoid factor, unspecified site: Secondary | ICD-10-CM

## 2023-08-14 DIAGNOSIS — J189 Pneumonia, unspecified organism: Secondary | ICD-10-CM | POA: Diagnosis not present

## 2023-08-14 DIAGNOSIS — Z7952 Long term (current) use of systemic steroids: Secondary | ICD-10-CM

## 2023-08-14 DIAGNOSIS — T827XXA Infection and inflammatory reaction due to other cardiac and vascular devices, implants and grafts, initial encounter: Secondary | ICD-10-CM | POA: Diagnosis not present

## 2023-08-14 DIAGNOSIS — R21 Rash and other nonspecific skin eruption: Secondary | ICD-10-CM

## 2023-08-14 DIAGNOSIS — B952 Enterococcus as the cause of diseases classified elsewhere: Secondary | ICD-10-CM | POA: Diagnosis not present

## 2023-08-14 DIAGNOSIS — R197 Diarrhea, unspecified: Secondary | ICD-10-CM

## 2023-08-14 LAB — GLUCOSE, CAPILLARY
Glucose-Capillary: 84 mg/dL (ref 70–99)
Glucose-Capillary: 114 mg/dL — ABNORMAL HIGH (ref 70–99)
Glucose-Capillary: 142 mg/dL — ABNORMAL HIGH (ref 70–99)
Glucose-Capillary: 143 mg/dL — ABNORMAL HIGH (ref 70–99)

## 2023-08-14 LAB — COMPREHENSIVE METABOLIC PANEL WITH GFR
ALT: 29 U/L (ref 0–44)
AST: 38 U/L (ref 15–41)
Albumin: 2.6 g/dL — ABNORMAL LOW (ref 3.5–5.0)
Alkaline Phosphatase: 36 U/L — ABNORMAL LOW (ref 38–126)
Anion gap: 9 (ref 5–15)
BUN: 19 mg/dL (ref 8–23)
CO2: 25 mmol/L (ref 22–32)
Calcium: 8.7 mg/dL — ABNORMAL LOW (ref 8.9–10.3)
Chloride: 103 mmol/L (ref 98–111)
Creatinine, Ser: 1.53 mg/dL — ABNORMAL HIGH (ref 0.44–1.00)
GFR, Estimated: 35 mL/min — ABNORMAL LOW (ref >60)
Glucose, Bld: 87 mg/dL (ref 70–99)
Potassium: 3.6 mmol/L (ref 3.5–5.1)
Sodium: 137 mmol/L (ref 135–145)
Total Bilirubin: 0.6 mg/dL (ref 0.0–1.2)
Total Protein: 6.2 g/dL — ABNORMAL LOW (ref 6.5–8.1)

## 2023-08-14 MED ORDER — SODIUM CHLORIDE 0.9 % IV SOLN
2.0000 g | Freq: Four times a day (QID) | INTRAVENOUS | Status: DC
  Administered 2023-08-14 – 2023-08-16 (×9): 2 g via INTRAVENOUS
  Filled 2023-08-14 (×11): qty 2000

## 2023-08-14 MED ORDER — SODIUM CHLORIDE 0.9% FLUSH
10.0000 mL | Freq: Two times a day (BID) | INTRAVENOUS | Status: DC
  Administered 2023-08-14 – 2023-08-20 (×12): 10 mL
  Administered 2023-08-21: 40 mL

## 2023-08-14 MED ORDER — CHLORHEXIDINE GLUCONATE CLOTH 2 % EX PADS
6.0000 | MEDICATED_PAD | Freq: Every day | CUTANEOUS | Status: DC
  Administered 2023-08-14 – 2023-08-22 (×9): 6 via TOPICAL

## 2023-08-14 MED ORDER — SODIUM CHLORIDE 0.9% FLUSH: 10.0000 mL | INTRAVENOUS | Status: DC | PRN

## 2023-08-14 NOTE — Progress Notes (Signed)
 PHARMACY NOTE:  ANTIMICROBIAL RENAL DOSAGE ADJUSTMENT  Current antimicrobial regimen includes a mismatch between antimicrobial dosage and estimated renal function.  As per policy approved by the Pharmacy & Therapeutics and Medical Executive Committees, the antimicrobial dosage will be adjusted accordingly.  Current antimicrobial dosage:  Ampicillin  2g IV every 8 hours  Indication: E faecalis bacteremia, concern for ICD ifxn/IE  Renal Function:  Estimated Creatinine Clearance: 25.1 mL/min (A) (by C-G formula based on SCr of 1.53 mg/dL (H)).     Antimicrobial dosage has been changed to:  Ampicillin  2g IV every 6 hours  Additional comments: CrCl equation could be underestimating the patient's clearance. eGFR and nCrCl both higher at 35-40 ml/min. Baseline appears to be 1.2-1.3 so will go ahead and adjust to planned discharge dose of 8g CI/day   Thank you for allowing pharmacy to be a part of this patient's care.  Almarie Lunger, PharmD, BCPS, BCIDP Infectious Diseases Clinical Pharmacist 08/14/2023 12:18 PM   **Pharmacist phone directory can now be found on [email or web address] ([email or web address]).  Listed under [facility].

## 2023-08-14 NOTE — Care Management Important Message (Signed)
 Important Message  Patient Details  Name: Ebony Scott MRN: [id number] Date of Birth: 06/10/1950   Important Message Given:  Yes - Medicare IM     [name] 08/14/2023, 9:51 AM

## 2023-08-14 NOTE — Progress Notes (Signed)
 Occupational Therapy Treatment Patient Details Name: Ebony Scott MRN: [id number] DOB: 1950/05/08 Today's Date: 08/14/2023   History of present illness 73 yo female admitted with Pna. Hx of RA-chronic prednision, HF, CAD, chronic resp failure-O2 at night, anemia, chronic pain, fibromyalgia, osteomyelitis, DVT, osteoporosis, breast Ca, CKD, DM   OT comments  Pt c/o pain to B feet, fatigue, and dizziness throughout session. Pt mod I for bed mobility, increased time due to weakness. Pt too fatigued to don/doff socks, with dizziness. Pt able to stand for only 15 seconds before needing to sit, not able to tolerate further therapy and returned to bed. Pt and family reports Pt will not have assistance over weekend until June 30th, Pt will not be able to handle IV antibiotics at home or complete ADLs or fix meals, get to toilet, etc., without assistance. HHOT still recommended if Pt has daily support, will follow acutely to progress as able.  BP sitting EOB 97/57 HR 78, standing 82/51 HR 81, supine 92/52 HR 76, dizziness, fatigue.       If plan is discharge home, recommend the following:  A little help with walking and/or transfers;A little help with bathing/dressing/bathroom;Help with stairs or ramp for entrance   Equipment Recommendations  None recommended by OT    Recommendations for Other Services      Precautions / Restrictions Precautions Precautions: Fall Recall of Precautions/Restrictions: Intact Restrictions Weight Bearing Restrictions Per Provider Order: No       Mobility Bed Mobility Overal bed mobility: Modified Independent                  Transfers Overall transfer level: Needs assistance Equipment used: Rolling walker (2 wheels) Transfers: Sit to/from Stand Sit to Stand: Contact guard assist           General transfer comment: CGA for safety, low BP     Balance Overall balance assessment: Needs assistance Sitting-balance support: No upper extremity  supported, Feet supported Sitting balance-Leahy Scale: Good     Standing balance support: Bilateral upper extremity supported, During functional activity Standing balance-Leahy Scale: Fair Standing balance comment: static standing no UE support                           ADL either performed or assessed with clinical judgement   ADL Overall ADL's : Needs assistance/impaired                 Upper Body Dressing : Set up;Sitting   Lower Body Dressing: Minimal assistance;Sit to/from stand   Toilet Transfer: Rolling walker (2 wheels);Ambulation;Supervision/safety;Contact guard assist;Regular Toilet;Cueing for safety           Functional mobility during ADLs: Contact guard assist;Supervision/safety;Rolling walker (2 wheels);Cueing for safety General ADL Comments: Pt weak with low BP limiting OOB activities, dizzy upon sitting EOB and trying to stand, BP dropped from 97/57 to 82/51 with sit to stand.    Extremity/Trunk Assessment              Vision       Restaurant manager, fast food Communication: No apparent difficulties   Cognition Arousal: Alert Behavior During Therapy: WFL for tasks assessed/performed Cognition: No apparent impairments             OT - Cognition Comments: Ox4. Has some mild memory deficits.                 Following  commands: Intact        Cueing   Cueing Techniques: Verbal cues  Exercises      Shoulder Instructions       General Comments BP sitting EOB 97/57 HR 78, standing 82/51 HR 80, supine 92/52 HR 76, Pt c/o dizziness and fatigue/weakness. Pt and family state her daughter will be gone until june 30th and no one will be at home to assist Pt with IV antibiotics or ADLs/mobility, meals, etc.    Pertinent Vitals/ Pain       Pain Assessment Pain Assessment: 0-10 Pain Score: 6  Faces Pain Scale: Hurts even more Pain Location: chronic feet pain Pain Descriptors / Indicators:  Discomfort, Aching, Constant Pain Intervention(s): Monitored during session, Limited activity within patient's tolerance  Home Living                                          Prior Functioning/Environment              Frequency  Min 2X/week        Progress Toward Goals  OT Goals(current goals can now be found in the care plan section)  Progress towards OT goals: Progressing toward goals  Acute Rehab OT Goals Patient Stated Goal: to improve activity tolerance OT Goal Formulation: With patient/family Time For Goal Achievement: 08/21/23 Potential to Achieve Goals: Good ADL Goals Pt Will Perform Grooming: with modified independence;standing Pt Will Transfer to Toilet: with modified independence;ambulating Pt Will Perform Toileting - Clothing Manipulation and hygiene: with modified independence Additional ADL Goal #1: Patient will identify at least 3 energy conservation strategies to employ at home in order to maximize function and quality of life and decrease caregiver burden while preventing exacerbation of symptoms and rehospitalization. Additional ADL Goal #2: Pt will use her handout as needed to identify at least 3 ways she can protect her joints and avoid progression of RA deformity and pain in hands/UEs while performing BADLs and IADLs.  Plan      Co-evaluation                 AM-PAC OT 6 Clicks Daily Activity     Outcome Measure   Help from another person eating meals?: None Help from another person taking care of personal grooming?: None Help from another person toileting, which includes using toliet, bedpan, or urinal?: A Little Help from another person bathing (including washing, rinsing, drying)?: A Little Help from another person to put on and taking off regular upper body clothing?: A Little Help from another person to put on and taking off regular lower body clothing?: A Little 6 Click Score: 20    End of Session Equipment  Utilized During Treatment: Rolling walker (2 wheels)  OT Visit Diagnosis: Unsteadiness on feet (R26.81);Pain;Muscle weakness (generalized) (M62.81) Pain - part of body: Ankle and joints of foot   Activity Tolerance Patient limited by fatigue   Patient Left in bed;with call bell/phone within reach;with family/visitor present   Nurse Communication Mobility status        Time: 8485-8469 OT Time Calculation (min): 16 min  Charges: OT General Charges $OT Visit: 1 Visit OT Treatments $Therapeutic Activity: 8-22 mins  Murphy, OTR/L   Ebony Scott 08/14/2023, 3:56 PM

## 2023-08-14 NOTE — Progress Notes (Addendum)
 Patient Name: Ebony Scott Date of Encounter: 08/14/2023  Primary Cardiologist: Shelda Bruckner, MD Electrophysiologist: Eulas FORBES Furbish, MD  Interval Summary   The patient is doing well today.  At this time, the patient denies chest pain, shortness of breath, or any new concerns.  Vital Signs    Vitals:   08/13/23 2015 08/14/23 0012 08/14/23 0449 08/14/23 0749  BP: 121/72  112/65 126/76  Pulse:    67  Resp: 20 18 18 18   Temp: 98.6 F (37 C) 100.2 F (37.9 C) 99.6 F (37.6 C) 98.7 F (37.1 C)  TempSrc: Oral Oral Oral Oral  SpO2:  94%  96%  Weight:      Height:        Intake/Output Summary (Last 24 hours) at 08/14/2023 1046 Last data filed at 08/13/2023 2016 Gross per 24 hour  Intake --  Output 800 ml  Net -800 ml   Filed Weights   08/07/23 2300 08/09/23 2254  Weight: 59 kg 55.5 kg    Physical Exam    GEN- adult female lying in bed in NAD,  alert and oriented x 3 today.   Lungs- Clear to ausculation bilaterally, normal work of breathing Cardiac- Regular rate and rhythm, no murmurs, rubs or gallops GI- soft, NT, ND, + BS Extremities- no clubbing or cyanosis. No edema  Telemetry    SR 70's, rare PVC (personally reviewed)  Hospital Course    Ebony Scott is a 73 y.o. female  with PMH of ICM / HFrEF s/p ICD admitted 08/06/23 for fever, severe weakness and chills. Work up consistent with E. Faecalis bacteremia.  She was seen by ID and treated with IV abx.  TTE did not show vegetation.  No stigmata of IE symptoms or pocket concerns. Subsequent TEE 08/12/23 showed severe LV dysfunction, no vegetation.   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 Waddell  -she will need to continue on OAC through 6/29 and no  doses after in anticipation of extraction. HOLD OAC on 6/30, 7/1 and 7/2.  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 She denies chest pain or sob. Many questions about timing of lead extraction. On exam, CV with a RRR and lungs with minimal scattered rales. Ext with no edema. Neuro is non-focal. Tele with NSR.  A/P E.Faecalis bacteremia - her TEE was negative. I would recommend extraction of her indwelling ICD. She had xarelto  last night. She is on my schedule for a week from today, 08/21/23. She can be discharged from my perspective with arrangements made for home IV anti-biotic therapy.  Chronic systolic heart failure - her symptoms are class 2. No change in her meds.  Coags - I would want her last dose of Xarelto  to be on Sunday night, 6/29. We will restart after  her ICD lead removal.   Danelle Hogan Hoobler,MD

## 2023-08-14 NOTE — TOC Initial Note (Signed)
 Transition of Care [facility]) - Initial/Assessment Note    Patient Details  Name: Ebony Scott MRN: [id number] Date of Birth: [birth year]  Transition of Care [facility]) CM/SW Contact:    Sudie Erminio Deems, RN Phone Number: 08/14/2023, 11:59 AM  Clinical Narrative:  Risk for readmission assessment completed. Patient presented for generalized weakness, fatigue, fever, and cough. PTA patient was from home with support of daughter that is a Engineer, civil (consulting) and she lives in the basement. Case Manager discussed home needs with the patient-plan for PICC line for home IV antibiotics. Patient is currently active with [facility] RN/PT/OT-adding Aide-will need orders and F2F. Patient has DME rollator and oxygen . Ebony Scott will follow for IV antibiotic therapy and will educate the daughter. [facility] is aware of the plan for IV antibiotic therapy. Case Manager will continue to follow for additional transition of care needs.                 Expected Discharge Plan: Home w Home Health Services Barriers to Discharge: Continued Medical Work up   Patient Goals and CMS Choice Patient states their goals for this hospitalization and ongoing recovery are:: Plan to return home with home health services. CMS Medicare.gov Compare Post Acute Care list provided to::  (NA) Choice offered to / list presented to :  (Active with [facility]) [place] ownership interest in [facility].provided to:: Parent NA    Expected Discharge Plan and Services In-house Referral: NA Discharge Planning Services: CM Consult Post Acute Care Choice: Home Health, Resumption of Svcs/PTA Provider Living arrangements for the past 2 months: Single Family Home                 DME Arranged: IV pump/equipment DME Agency:  [facility]) Date DME Agency Contacted: 08/14/23 Time DME Agency Contacted: 1154 Representative spoke with at DME Agency: Pam HH Arranged: RN, Disease Management, PT, OT, Nurse's Aide HH Agency: [facility] Date [facility]  Agency Contacted: 08/14/23 Time HH Agency Contacted: 1159 Representative spoke with at [facility] Agency: Arna  Prior Living Arrangements/Services Living arrangements for the past 2 months: Single Family Home Lives with:: Adult Children Patient language and need for interpreter reviewed:: Yes Do you feel safe going back to the place where you live?: Yes      Need for Family Participation in Patient Care: Yes (Comment) Care giver support system in place?: Yes (comment) Current home services: DME (rollator, oxygen ) Criminal Activity/Legal Involvement Pertinent to Current Situation/Hospitalization: No - Comment as needed  Activities of Daily Living   ADL Screening (condition at time of admission) Independently performs ADLs?: Yes (appropriate for developmental age) Is the patient deaf or have difficulty hearing?: No Does the patient have difficulty seeing, even when wearing glasses/contacts?: No Does the patient have difficulty concentrating, remembering, or making decisions?: No  Permission Sought/Granted Permission sought to share information with : Family Supports, Case Production designer, theatre/television/film, Oceanographer granted to share information with : Yes, Verbal Permission Granted  Share Information with NAME: Charlies and Harden  Permission granted to share info w AGENCY: [facility]  Permission granted to share info w Relationship: children  Permission granted to share info w Contact Information: [phone]  Emotional Assessment Appearance:: Appears stated age Attitude/Demeanor/Rapport: Engaged Affect (typically observed): Appropriate Orientation: : Oriented to Self, Oriented to Place, Oriented to  Time, Oriented to Situation Alcohol  / Substance Use: Not Applicable Psych Involvement: No (comment)  Admission diagnosis:  Community acquired pneumonia [J18.9] AKI (acute kidney injury) (HCC) [  N17.9] Community acquired pneumonia, unspecified laterality  [J18.9] Patient Active Problem List   Diagnosis Date Noted   Enterococcus faecalis infection 08/08/2023   Enterococcal bacteremia 08/08/2023   Community acquired pneumonia 08/06/2023   Generalized weakness 08/06/2023   AKI (acute kidney injury) (HCC) 08/06/2023   Acute metabolic acidosis 05/20/2023   RLL pneumonia 05/18/2023   Nocturnal hypoxia - 2-3 L/min 05/18/2023   Acute respiratory failure with hypoxia (HCC) 05/18/2023   Current chronic use of systemic steroids - prednisone  7 mg daily for hx of RA 05/18/2023   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   Sepsis without acute organ dysfunction (HCC) 11/30/2022   Fever 09/01/2022   DNR (do not resuscitate) 08/31/2022   Noninfectious otitis externa of left ear 06/08/2021   Iron  deficiency anemia 02/24/2021   GAVE (gastric antral vascular ectasia)    Anticoagulated    Antiplatelet or antithrombotic long-term use    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   Coronary artery calcification seen on CAT scan 01/11/2021   History of DVT (deep vein thrombosis) 09/24/2020   DMII (diabetes mellitus, type 2) (HCC) 09/23/2020   Hoarseness 09/09/2020   S/P reverse total shoulder arthroplasty, right    DOE (dyspnea on exertion) 02/18/2020   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   S/P reverse total shoulder arthroplasty, left 02/16/2020   History of pulmonary embolus (PE) 12/19/2018   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   Rheumatoid arthritis (HCC)    Bilateral impacted cerumen 08/18/2018   Hypothyroidism 01/14/2018   Depression 01/14/2018   Debility    Protein-calorie malnutrition, moderate (HCC) 12/17/2017   Knee pain    History of breast cancer    Chronic pain syndrome    Fibromyalgia    Graves disease    Dysphonia 08/10/2017   Pulmonary nodules    Chronic kidney disease, stage 3b (HCC) - baseline Scr 1.3-1.5 04/15/2016   Hx of adenomatous colonic polyps 04/12/2016   Opacity of lung on imaging study 11/03/2015    Severe persistent asthma 02/08/2015   Facial pain syndrome 02/08/2015   Insomnia 02/08/2015   Other allergic rhinitis 02/08/2015   LPRD (laryngopharyngeal reflux disease) 02/08/2015   Chronic systolic CHF (congestive heart failure) (HCC) 02/08/2015   Diastolic dysfunction 02/08/2015   Benign paroxysmal positional vertigo 12/03/2012   Disequilibrium 10/29/2012   Pseudogout 02/24/2012   Irritable bowel syndrome 01/02/2010   Hyperlipidemia 12/23/2009   Hyperthyroidism 11/12/2006   Seasonal and perennial allergic rhinitis 11/12/2006   GERD 11/12/2006   NECK PAIN, CHRONIC 11/12/2006   OSTEOPOROSIS 11/12/2006   BREAST CANCER, HX OF 11/12/2006   PCP:  [name], MD Pharmacy:   [id number] - [place], [place] - [address] [place] [address] Phone: [phone] Fax: [phone]     Social Drivers of Health (SDOH) Social History: SDOH Screenings   Food Insecurity: No Food Insecurity (08/07/2023)  Housing: Low Risk  (08/07/2023)  Transportation Needs: No Transportation Needs (08/07/2023)  Utilities: Not At Risk (08/07/2023)  Alcohol  Screen: Low Risk  (11/22/2022)  Depression (PHQ2-9): Low Risk  (05/29/2023)  Financial Resource Strain: Low Risk  (03/26/2023)  Physical Activity: Inactive (03/26/2023)  Social Connections: Socially Isolated (08/07/2023)  Stress: Stress Concern Present (03/26/2023)  Tobacco Use: Low Risk  (08/12/2023)  Health Literacy: Adequate Health Literacy (11/22/2022)   SDOH Interventions:     Readmission Risk Interventions    08/14/2023   11:49 AM 05/20/2023   12:38 PM 12/06/2022    2:19 PM  Readmission Risk Prevention Plan  Transportation Screening Complete Complete  Complete  PCP or Specialist Appt within 3-5 Days   Complete  HRI or Home Care Consult   Complete  Social Work Consult for Recovery Care Planning/Counseling   Complete  Palliative Care Screening   Complete  Medication Review Oceanographer) Referral to Pharmacy  Complete Complete  PCP or Specialist appointment within 3-5 days of discharge  Complete   HRI or Home Care Consult Complete Complete   SW Recovery Care/Counseling Consult Complete Complete   Palliative Care Screening Not Applicable Not Applicable   Skilled Nursing Facility Not Applicable Complete

## 2023-08-14 NOTE — Progress Notes (Signed)
 PROGRESS NOTE   Ebony Scott  FMW:[id number]    DOB: 1950/11/04    DOA: 08/06/2023  PCP: [name], MD   I have briefly reviewed patients previous medical records in [facility]   Brief Hospital Course:   73 y.o. 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  She is admitted for CIED associated E faecalis bacteremia, community acquired pneumonia, acute respiratory failure and AKI.  ID following.  EP plan extraction of ICD on 7/2.   Assessment & Plan:   CIED associated effect Ellis bacteremia ID consultation and follow-up appreciated. Continuing IV ampicillin  and ceftriaxone  per OPAT protocol EP follow-up appreciated and plan ICD extraction for 7/2.  As per communication with EP team, patient could be discharged for outpatient follow-up for this procedure. 6/17: 1 of 2 sets of blood cultures positive for Enterococcus faecalis, sensitive to ampicillin , vancomycin  and gentamicin  synergy Surveillance blood cultures x 2 from 6/19: Negative, final report. TEE 6/23: No thrombus.  No vegetation noted on RV lead.  No valvular vegetations.  Sepsis due to community-acquired pneumonia/recurrent pneumonitis Completed IV antibiotic course for same. Patient reportedly has history of immunosuppression and recurrent pneumonias. Pulmonology evaluated.  Acute kidney injury on stage IIIb chronic kidney disease Baseline creatinine reportedly 1.3-1.5. Had presented with creatinine of 2.14, creatinine has improved and probably at new  baseline. Trend BMP and avoid nephrotoxins and hypotension.  Acute on chronic respiratory failure with hypoxia/bronchiectasis At baseline patient reportedly uses [place] oxygen  2 to 3 L/min at night. Currently weaned off to room air.  Acute respiratory failure has resolved.  Ischemic cardiomyopathy/chronic HFrEF/s/p ICD Clinically euvolemic. As per EP Cardiology, she will need to continue on Xarelto  through 6/29 and no doses after in anticipation of extraction.  They recommend holding Xarelto  on 6/30, 7/1 and 7/2.  Hypotension Noted on 6/25 with SBP in the 90s. May be due to multiple meds i.e. Lasix , BiDil , Entresto .  Added holding parameters for evening doses Hesitant to give IV fluid bolus or IV fluid maintenance due to CHF history.  Monitor closely.  History of PE/DVT Management of Xarelto  as noted above  Hypogammaglobulinemia On IVIG as outpatient.  Seropositive rheumatoid arthritis/chronic steroid use Back on prednisone  5 Mg daily after stress dose steroids early on in admission Has a scheduled follow-up with rheumatology on 6/25 but since hospitalized, this will need to be rescheduled  Type II DM, controlled A1c 6.5 on 6/18 CBGs well-controlled, discontinued SSI.  Chronic pain syndrome Continue gabapentin   Hypokalemia Replaced.  Generalized weakness/physical deconditioning Home health PT and OT recommended by therapies.  However as per patient and family, there will be no family members at home until 7/2.  TOC consulted for any assistance.  Body mass index is 22.38 kg/m.   DVT prophylaxis:      Code Status: Limited: Do not attempt resuscitation (DNR) -DNR-LIMITED -Do Not Intubate/DNI :  Family Communication: I discussed in detail with patient's daughter via phone, updated care and answered all questions.  She is an Charity fundraiser here. Disposition:  Status is: Inpatient Remains inpatient appropriate because: IV antibiotics.  Determination regarding safety  at home prior to  discharge.     Consultants:   EP Cardiology Infectious disease Critical care  Procedures:   As noted above  Subjective:  Seen this morning.  Reported some dyspnea on exertion but denied any other complaints including pain.  Per PT, had some dizziness and fatigue with SBP's in the 90s and when upright as low as 82.  Objective:   Vitals:   08/14/23 0449 08/14/23 0749 08/14/23 1132 08/14/23 1610  BP: 112/65 126/76 (!) 92/53 91/61  Pulse:  67 78 73  Resp: 18 18 16 17   Temp: 99.6 F (37.6 C) 98.7 F (37.1 C) 99.6 F (37.6 C) 99.3 F (37.4 C)  TempSrc: Oral Oral Oral Oral  SpO2:  96% 91% 96%  Weight:      Height:        General exam: Elderly female, moderately built and nourished lying comfortably propped up in bed without distress.  Oral mucosa moist. Respiratory system: Clear to auscultation. Respiratory effort normal. Cardiovascular system: S1 & S2 heard, RRR. No JVD, murmurs, rubs, gallops or clicks. No pedal edema.  Telemetry personally reviewed: Sinus rhythm. Gastrointestinal system: Abdomen is nondistended, soft and nontender. No organomegaly or masses felt. Normal bowel sounds heard. Central nervous system: Alert and oriented. No focal neurological deficits. Extremities: Symmetric 5 x 5 power. Skin: No rashes, lesions or ulcers Psychiatry: Judgement and insight appear normal. Mood & affect appropriate.     Data Reviewed:   I have personally reviewed following labs and imaging studies   CBC: Recent Labs  Lab 08/12/23 0657  WBC 11.5*  NEUTROABS 8.0*  HGB 9.0*  HCT 28.6*  MCV 102.1*  PLT 149*    Basic Metabolic Panel: Recent Labs  Lab 08/09/23 0422 08/10/23 0621 08/12/23 0657 08/13/23 0428 08/14/23 0449  NA 142 142 140 137 137  K 3.2* 3.3* 3.7 3.5 3.6  CL 110 114* 111 104 103  CO2 20* 20* 20* 24 25  GLUCOSE 165* 92 82 88 87  BUN 33* 25* 17 19 19   CREATININE 1.51* 1.18* 1.36* 1.59* 1.53*  CALCIUM  7.9* 7.8* 8.1* 8.6* 8.7*    Liver Function  Tests: Recent Labs  Lab 08/12/23 0657 08/13/23 0428 08/14/23 0449  AST 44* 47* 38  ALT 32 34 29  ALKPHOS 29* 34* 36*  BILITOT 0.6 0.5 0.6  PROT 5.4* 5.8* 6.2*  ALBUMIN 2.4* 2.4* 2.6*    CBG: Recent Labs  Lab 08/14/23 0752 08/14/23 1128 08/14/23 1610  GLUCAP 84 114* 142*    Microbiology Studies:   Recent Results (from the past 240 hours)  Resp panel by RT-PCR (RSV, Flu A&B, Covid) Anterior Nasal Swab     Status: None   Collection Time: 08/06/23  4:04 PM   Specimen: Anterior Nasal Swab  Result Value Ref Range Status   SARS Coronavirus 2 by RT PCR NEGATIVE NEGATIVE Final    Comment: (NOTE) SARS-CoV-2 target nucleic acids are NOT DETECTED.  The SARS-CoV-2 RNA is generally detectable in upper respiratory specimens during the acute phase of infection. The lowest concentration of SARS-CoV-2 viral copies this assay can detect is 138 copies/mL. A negative result does not preclude SARS-Cov-2 infection and should not be used as the sole basis for treatment or other patient management decisions. A negative result may occur with  improper specimen collection/handling, submission of specimen other than nasopharyngeal swab, presence of viral mutation(s) within the areas targeted by this assay, and inadequate number of viral copies(<138 copies/mL). A negative result must be  combined with clinical observations, patient history, and epidemiological information. The expected result is Negative.  Fact Sheet for Patients:  BloggerCourse.com  Fact Sheet for Healthcare Providers:  SeriousBroker.it  This test is no t yet approved or cleared by the [place]  FDA and  has been authorized for detection and/or diagnosis of SARS-CoV-2 by FDA under an Emergency Use Authorization (EUA). This EUA will remain  in effect (meaning this test can be used) for the duration of the COVID-19 declaration under Section 564(b)(1) of the Act,  21 U.S.C.section 360bbb-3(b)(1), unless the authorization is terminated  or revoked sooner.       Influenza A by PCR NEGATIVE NEGATIVE Final   Influenza B by PCR NEGATIVE NEGATIVE Final    Comment: (NOTE) The Xpert Xpress SARS-CoV-2/FLU/RSV plus assay is intended as an aid in the diagnosis of influenza from Nasopharyngeal swab specimens and should not be used as a sole basis for treatment. Nasal washings and aspirates are unacceptable for Xpert Xpress SARS-CoV-2/FLU/RSV testing.  Fact Sheet for Patients: BloggerCourse.com  Fact Sheet for Healthcare Providers: SeriousBroker.it  This test is not yet approved or cleared by the [place]  FDA and has been authorized for detection and/or diagnosis of SARS-CoV-2 by FDA under an Emergency Use Authorization (EUA). This EUA will remain in effect (meaning this test can be used) for the duration of the COVID-19 declaration under Section 564(b)(1) of the Act, 21 U.S.C. section 360bbb-3(b)(1), unless the authorization is terminated or revoked.     Resp Syncytial Virus by PCR NEGATIVE NEGATIVE Final    Comment: (NOTE) Fact Sheet for Patients: BloggerCourse.com  Fact Sheet for Healthcare Providers: SeriousBroker.it  This test is not yet approved or cleared by the [place]  FDA and has been authorized for detection and/or diagnosis of SARS-CoV-2 by FDA under an Emergency Use Authorization (EUA). This EUA will remain in effect (meaning this test can be used) for the duration of the COVID-19 declaration under Section 564(b)(1) of the Act, 21 U.S.C. section 360bbb-3(b)(1), unless the authorization is terminated or revoked.  Performed at [facility], [address] [place], [place] [address]   Blood Culture (routine x 2)     Status: Abnormal   Collection Time: 08/06/23  6:41 PM   Specimen: Site Not Specified;  Blood  Result Value Ref Range Status   Specimen Description   Final    SITE NOT SPECIFIED Performed at [facility], [address], [place], [place] [address]    Special Requests   Final    BOTTLES DRAWN AEROBIC ONLY Blood Culture results may not be optimal due to an inadequate volume of blood received in culture bottles Performed at [facility], [address], [place], [place] [address]    Culture  Setup Time   Final    GRAM POSITIVE COCCI IN CHAINS AEROBIC BOTTLE ONLY CRITICAL RESULT CALLED TO, READ BACK BY AND VERIFIED WITH: PHARMD TERRY GREEN ON 08/07/23 @ 1227 BY DRT Performed at [facility], [address], [place], [place] [address]    Culture ENTEROCOCCUS FAECALIS (A)  Final   Report Status 08/09/2023 FINAL  Final   Organism ID, Bacteria ENTEROCOCCUS FAECALIS  Final      Susceptibility   Enterococcus faecalis - MIC*    AMPICILLIN  <=2 SENSITIVE Sensitive     VANCOMYCIN  1 SENSITIVE Sensitive     GENTAMICIN  SYNERGY SENSITIVE Sensitive     * ENTEROCOCCUS FAECALIS  Blood Culture ID Panel (Reflexed)  Status: Abnormal   Collection Time: 08/06/23  6:41 PM  Result Value Ref Range Status   Enterococcus faecalis DETECTED (A) NOT DETECTED Final    Comment: CRITICAL RESULT CALLED TO, READ BACK BY AND VERIFIED WITH: PHARMD TERRY GREEN ON 08/07/23 @ 1231 BY DRT    Enterococcus Faecium NOT DETECTED NOT DETECTED Final   Listeria monocytogenes NOT DETECTED NOT DETECTED Final   Staphylococcus species NOT DETECTED NOT DETECTED Final   Staphylococcus aureus (BCID) NOT DETECTED NOT DETECTED Final   Staphylococcus epidermidis NOT DETECTED NOT DETECTED Final   Staphylococcus lugdunensis NOT DETECTED NOT DETECTED Final   Streptococcus species NOT DETECTED NOT DETECTED Final   Streptococcus agalactiae NOT DETECTED NOT DETECTED Final   Streptococcus pneumoniae NOT DETECTED NOT DETECTED Final   Streptococcus pyogenes NOT DETECTED NOT DETECTED  Final   A.calcoaceticus-baumannii NOT DETECTED NOT DETECTED Final   Bacteroides fragilis NOT DETECTED NOT DETECTED Final   Enterobacterales NOT DETECTED NOT DETECTED Final   Enterobacter cloacae complex NOT DETECTED NOT DETECTED Final   Escherichia coli NOT DETECTED NOT DETECTED Final   Klebsiella aerogenes NOT DETECTED NOT DETECTED Final   Klebsiella oxytoca NOT DETECTED NOT DETECTED Final   Klebsiella pneumoniae NOT DETECTED NOT DETECTED Final   Proteus species NOT DETECTED NOT DETECTED Final   Salmonella species NOT DETECTED NOT DETECTED Final   Serratia marcescens NOT DETECTED NOT DETECTED Final   Haemophilus influenzae NOT DETECTED NOT DETECTED Final   Neisseria meningitidis NOT DETECTED NOT DETECTED Final   Pseudomonas aeruginosa NOT DETECTED NOT DETECTED Final   Stenotrophomonas maltophilia NOT DETECTED NOT DETECTED Final   Candida albicans NOT DETECTED NOT DETECTED Final   Candida auris NOT DETECTED NOT DETECTED Final   Candida glabrata NOT DETECTED NOT DETECTED Final   Candida krusei NOT DETECTED NOT DETECTED Final   Candida parapsilosis NOT DETECTED NOT DETECTED Final   Candida tropicalis NOT DETECTED NOT DETECTED Final   Cryptococcus neoformans/gattii NOT DETECTED NOT DETECTED Final   Vancomycin  resistance NOT DETECTED NOT DETECTED Final    Comment: Performed at [facility], [address], [place], [place] [address]  Respiratory (~20 pathogens) panel by PCR     Status: None   Collection Time: 08/06/23  8:31 PM   Specimen: Nasal Mucosa; Respiratory  Result Value Ref Range Status   Adenovirus NOT DETECTED NOT DETECTED Final   Coronavirus 229E NOT DETECTED NOT DETECTED Final    Comment: (NOTE) The Coronavirus on the Respiratory Panel, DOES NOT test for the novel  Coronavirus (2019 nCoV)    Coronavirus HKU1 NOT DETECTED NOT DETECTED Final   Coronavirus NL63 NOT DETECTED NOT DETECTED Final   Coronavirus OC43 NOT DETECTED NOT DETECTED Final   Metapneumovirus NOT  DETECTED NOT DETECTED Final   Rhinovirus / Enterovirus NOT DETECTED NOT DETECTED Final   Influenza A NOT DETECTED NOT DETECTED Final   Influenza B NOT DETECTED NOT DETECTED Final   Parainfluenza Virus 1 NOT DETECTED NOT DETECTED Final   Parainfluenza Virus 2 NOT DETECTED NOT DETECTED Final   Parainfluenza Virus 3 NOT DETECTED NOT DETECTED Final   Parainfluenza Virus 4 NOT DETECTED NOT DETECTED Final   Respiratory Syncytial Virus NOT DETECTED NOT DETECTED Final   Bordetella pertussis NOT DETECTED NOT DETECTED Final   Bordetella Parapertussis NOT DETECTED NOT DETECTED Final   Chlamydophila pneumoniae NOT DETECTED NOT DETECTED Final   Mycoplasma pneumoniae NOT DETECTED NOT DETECTED Final    Comment: Performed at [facility], [address],  [place], [place] [address]  MRSA Next Gen by PCR, Nasal     Status: None   Collection Time: 08/06/23  8:31 PM   Specimen: Nasal Mucosa; Nasal Swab  Result Value Ref Range Status   MRSA by PCR Next Gen NOT DETECTED NOT DETECTED Final    Comment: (NOTE) The GeneXpert MRSA Assay (FDA approved for NASAL specimens only), is one component of a comprehensive MRSA colonization surveillance program. It is not intended to diagnose MRSA infection nor to guide or monitor treatment for MRSA infections. Test performance is not FDA approved in patients less than 54 years old. Performed at [facility], [address] [place], [place] [address]   Blood Culture (routine x 2)     Status: Abnormal   Collection Time: 08/06/23  8:43 PM   Specimen: BLOOD RIGHT HAND  Result Value Ref Range Status   Specimen Description   Final    BLOOD RIGHT HAND Performed at [facility], [address], [place], [place] [address]    Special Requests   Final    BOTTLES DRAWN AEROBIC AND ANAEROBIC Blood Culture results may not be optimal due to an inadequate volume of blood received in culture bottles Performed at [facility], [address], [place], [place] [address]    Culture  Setup Time   Final    GRAM POSITIVE COCCI IN CHAINS ANAEROBIC BOTTLE ONLY CRITICAL VALUE NOTED.  VALUE IS CONSISTENT WITH PREVIOUSLY REPORTED AND CALLED VALUE. GRAM POSITIVE COCCI IN CLUSTERS AEROBIC BOTTLE ONLY Organism ID to follow    Culture (A)  Final    ENTEROCOCCUS FAECALIS SUSCEPTIBILITIES PERFORMED ON PREVIOUS CULTURE WITHIN THE LAST 5 DAYS. STAPHYLOCOCCUS EPIDERMIDIS THE SIGNIFICANCE OF ISOLATING THIS ORGANISM FROM A SINGLE SET OF BLOOD CULTURES WHEN MULTIPLE SETS ARE DRAWN IS UNCERTAIN. PLEASE NOTIFY THE MICROBIOLOGY DEPARTMENT WITHIN ONE WEEK IF SPECIATION AND SENSITIVITIES ARE REQUIRED. Performed at [facility], [address], [place], [place] [address]    Report Status 08/09/2023 FINAL  Final  Culture, blood (Routine X 2) w Reflex to ID Panel     Status: None   Collection Time: 08/08/23  1:11 PM   Specimen: BLOOD RIGHT HAND  Result Value Ref Range Status   Specimen Description BLOOD RIGHT HAND  Final   Special Requests   Final    BOTTLES DRAWN AEROBIC ONLY Blood Culture adequate volume   Culture   Final    NO GROWTH 5 DAYS Performed at [facility], [address], [place], [place] [address]    Report Status 08/13/2023 FINAL  Final  Culture, blood (Routine X 2) w Reflex to ID Panel     Status: None   Collection Time: 08/08/23  1:12 PM   Specimen: BLOOD RIGHT HAND  Result Value Ref Range Status   Specimen Description BLOOD RIGHT HAND  Final   Special Requests   Final    BOTTLES DRAWN AEROBIC ONLY Blood Culture adequate volume   Culture   Final    NO GROWTH 5 DAYS Performed at [facility], [address], [place], [place] [address]    Report Status 08/13/2023 FINAL  Final    Radiology Studies:  US  EKG SITE RITE Result Date: 08/14/2023 If Site Rite image not attached, placement could not be confirmed due to current cardiac rhythm.   Scheduled Meds:    acidophilus  1 capsule Oral TID    atorvastatin   80 mg Oral QPM  budesonide -glycopyrrolate -formoterol   2 puff Inhalation BID   Chlorhexidine  Gluconate Cloth  6 each Topical Daily   cholecalciferol   1,000 Units Oral QHS   clopidogrel   75 mg Oral Daily   empagliflozin   10 mg Oral Daily   fluticasone   2 spray Each Nare Daily   folic acid   1 mg Oral Daily   furosemide   40 mg Oral Daily   gabapentin   300 mg Oral QHS   guaiFENesin   600 mg Oral BID   insulin  aspart  0-5 Units Subcutaneous QHS   insulin  aspart  0-9 Units Subcutaneous TID WC   isosorbide -hydrALAZINE   1 tablet Oral BID   nystatin  cream   Topical TID   pantoprazole   40 mg Oral Daily   predniSONE   5 mg Oral Q breakfast   rivaroxaban   15 mg Oral Q supper   sacubitril -valsartan   1 tablet Oral BID   sodium chloride  flush  10-40 mL Intracatheter Q12H    Continuous Infusions:    ampicillin  (OMNIPEN) IV 2 g (08/14/23 1807)   cefTRIAXone  (ROCEPHIN )  IV 2 g (08/14/23 1104)     LOS: 7 days     Trenda Mar, MD,  FACP, [facility], [facility], [facility]   [facility] Ad[facility] [place]      To contact the attending provider between 7A-7P or the covering provider during after hours 7P-7A, please log into the web site [email or web address] and access using universal [place] password for that web site. If you do not have the password, please call the hospital operator.  08/14/2023, 6:15 PM

## 2023-08-14 NOTE — Progress Notes (Signed)
 PT Cancellation Note  Patient Details Name: Ebony Scott MRN: [id number] DOB: 1950-05-24   Cancelled Treatment:    Reason Eval/Treat Not Completed: Patient at procedure or test/unavailable. Pt getting PICC line placed.   Rodgers ORN [facility] 08/14/2023, 4:21 PM Rodgers Opal PT Acute [employer] [phone]

## 2023-08-14 NOTE — Progress Notes (Signed)
 PHARMACY CONSULT NOTE FOR:  OUTPATIENT  PARENTERAL ANTIBIOTIC THERAPY (OPAT)  Indication: E faecalis bacteremia, presumed ICD infection Regimen: Ampicillin  8g daily as a continuous infusion + Rocephin  2g IV every 12 hours End date: 09/19/23 (6 weeks from 08/08/23)  IV antibiotic discharge orders are pended. To discharging provider:  please sign these orders via discharge navigator,  Select New Orders & click on the button choice - Manage This Unsigned Work.     Thank you for allowing pharmacy to be a part of this patient's care.  Almarie Lunger, PharmD, BCPS, BCIDP Infectious Diseases Clinical Pharmacist 08/14/2023 12:19 PM   **Pharmacist phone directory can now be found on [email or web address] ([email or web address]).  Listed under [facility].

## 2023-08-14 NOTE — Progress Notes (Signed)
 Peripherally Inserted Central Catheter Placement  The IV Nurse has discussed with the patient and/or persons authorized to consent for the patient, the purpose of this procedure and the potential benefits and risks involved with this procedure.  The benefits include less needle sticks, lab draws from the catheter, and the patient may be discharged home with the catheter. Risks include, but not limited to, infection, bleeding, blood clot (thrombus formation), and puncture of an artery; nerve damage and irregular heartbeat and possibility to perform a PICC exchange if needed/ordered by physician.  Alternatives to this procedure were also discussed.  Bard Power PICC patient education guide, fact sheet on infection prevention and patient information card has been provided to patient /or left at bedside.    PICC Placement Documentation  PICC Double Lumen 08/14/23 Right Brachial 33 cm 0 cm (Active)  Indication for Insertion or Continuance of Line Prolonged intravenous therapies 08/14/23 1733  Exposed Catheter (cm) 0 cm 08/14/23 1733  Site Assessment Clean, Dry, Intact 08/14/23 1733  Lumen #1 Status Flushed;Saline locked;Blood return noted 08/14/23 1733  Lumen #2 Status Flushed;Saline locked;Blood return noted 08/14/23 1733  Dressing Type Transparent;Securing device 08/14/23 1733  Dressing Status Antimicrobial disc/dressing in place 08/14/23 1733  Line Care Connections checked and tightened 08/14/23 1733  Line Adjustment (NICU/IV Team Only) No 08/14/23 1733  Dressing Intervention New dressing;Adhesive placed at insertion site (IV team only) 08/14/23 1733  Dressing Change Due 08/21/23 08/14/23 1733       Leita  Deryl Giroux 08/14/2023, 5:34 PM

## 2023-08-14 NOTE — Progress Notes (Addendum)
 [facility] Infectious Diseases Follow Up Note  Patient Identification: Patient Name: Ebony Scott MRN: [id number] Admit Date: 08/06/2023  3:42 PM Age: 73 y.o.Today's Date: 08/14/2023   Reason for Visit: CIED associated bacteremia   Principal Problem:   Community acquired pneumonia Active Problems:   Generalized weakness   AKI (acute kidney injury) (HCC)   Enterococcus faecalis infection   Enterococcal bacteremia   Antibiotics: Ampicillin  6/19-c Ceftriaxone  6/17-c Total days of antibiotics 9  Lines/Hardwares: ICD+, rt shoulder arthroplasty  Interval Events: Tmax 100.2, CIED extraction planned OP for 7/2  Assessment 73 year old female with prior history of RA on chronic prednisone , breast cancer in remission, history of C. 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  Initially treated for pneumonia and ID engaged for   # CIED associated E faecalis bacteremia - no GI or GU symptoms  - 6/17 UA negative for infection, strep pneumo, Legionella urine antigen negative - 6/19 blood cx NG in 5 days - 6/19 TTE with no vegetations  - 6/23 TEE no vegetation on RV lead or valves - EP following for possible extraction of cardiac device   # Staph epi in 1/4 blood cx - likely a contaminant   # AKI on CKD -  Cr 1.53  Recommendations - Continue ampicillin  and ceftriaxone  for 6 weeks from negative blood cx on 6/19. See OPAT  - PICC ordered, Okay for PICC instead of tunneled line with current GFR per IR - Monitor CBC and CMP - Fu with EP on 7/2 for device extraction, may need to adjust stop date based on device  extraction date - Universal/standard isolation precaution - ID will sign off, to call back with questions or concerns   OPAT  Diagnosis: CIED associated bacteremia  Culture Result: E faecalis  Allergies  Allergen Reactions   Baclofen  Anaphylaxis and Other (See Comments)    Altered mental status requiring a 3-day hospital stay- patient became unresponsive   Put me in a coma for five days, Altered mental status requiring a 3-day hospital stay- patient became unresponsive   Other Shortness Of Breath and Other (See Comments)    Grass and weeds sneezing; filled sinuses (02/20/2012)   Pork-Derived Products Anaphylaxis   Shellfish Allergy  Anaphylaxis, Shortness Of Breath, Itching and Rash   Shrimp Extract Anaphylaxis and Other (See Comments)    ALL SHELLFISH   Tetracycline Hcl Nausea And Vomiting   Xolair  [Omalizumab ] Other (See Comments)    Caused Blood clot   Zoledronic  Acid Other (See Comments)    Reclast - Fever, Put in hospital, dr said it was a reaction from a reaction    Fever, inability to walk, Put in hospital  Fever, Put in hospital, dr said it was a reaction from a reaction , Other reaction(s): Unknown   Celecoxib Swelling    Feet swelling   Dilaudid  [Hydromorphone  Hcl] Itching   Hydrocodone -Acetaminophen  Itching   Levofloxacin  Other (See Comments)    GI upset   Molds & Smuts Cough   Morphine  And Codeine Hives and Itching   Oxycodone  Hcl Nausea And Vomiting    GI Intolerance   Paroxetine Nausea And Vomiting and Other (See Comments)    GI Intolerance also   Tetracycline Hcl Other (See Comments)  GI Intolerance   Oxycodone -Acetaminophen  Itching   Diltiazem  Hcl Swelling   Dust Mite Extract Other (See Comments) and Cough    sneezing (02/20/2012) too   Gamunex [Immune Globulin ] Itching and Rash    Tolerates hizentra     Rituximab  Other (See Comments)    Anemia    Rofecoxib Swelling    Vioxx- feet swelling   Tree Extract Itching    tested and told I was  allergic to it; never experienced a reaction to it (02/20/2012)    OPAT Orders Discharge antibiotics to be given via PICC line Discharge antibiotics: Ampicillin  8g daily as a continuous infusion + Rocephin  2g IV every 12 hours  Per pharmacy protocol  Duration: 6 weeks  End Date: 09/19/23  [facility] Care Per Protocol:  Home health RN for IV administration and teaching; PICC line care and labs.    Labs weekly while on IV antibiotics: X__ CBC with differential __ BMP X__ CMP __ CRP __ ESR __ Vancomycin  trough __ CK  X__ Please pull PIC at completion of IV antibiotics __ Please leave PIC in place until doctor has seen patient or been notified  Fax weekly labs to [phone]  Clinic Follow Up Appt: 7/15 at 3: 30 pm  Rest of the management as per the primary team. Thank you for the consult. Please page with pertinent questions or concerns.  ______________________________________________________________________ Subjective patient seen and examined at the bedside.  No complaints  Past Medical History:  Diagnosis Date   Anemia    Angio-edema    Breast cancer (HCC) 1998   Left breast, in remission   Bronchiectasis (HCC)    CHF (congestive heart failure) (HCC)    Closed displaced fracture of metatarsal bone of right foot 01/20/2019   Closed fracture of fifth metatarsal bone of right foot, initial encounter 01/15/2018   Clostridium difficile colitis 01/14/2018   Coronary artery disease    Depression    some; don't take anything for it (02/20/2012), pt denies dx as of 06/02/21   Diverticulosis    DVT (deep venous thrombosis) (HCC)    Fibromyalgia 11/2011   Fracture of base of fifth metatarsal bone with routine healing, left    GAVE (gastric antral vascular ectasia)    GERD (gastroesophageal reflux disease)    Headache(784.0)    related to allergies; more at different times during the year (02/20/2012)   Hemorrhoids    Hiatal hernia    back and neck   History of blood  transfusion 2023   1 unit, 5 units of Iron    Hx of adenomatous colonic polyps 04/12/2016   Hypercholesteremia    good cholesterol is high   Hypothyroidism    h/o Graves disease   IBS (irritable bowel syndrome)    Moderate persistent asthma    -FeV1 72% 2011, -IgE 102 2011, CT sinus Neg 2011   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 Cindy Setter   Sleep apnea    uses cpap nightly   Tracheobronchomalacia    Vasculitis (HCC) 12/13/2017   Past Surgical History:  Procedure Laterality Date   ABDOMINAL HYSTERECTOMY N/A    Phreesia 10/21/2019   ANTERIOR AND POSTERIOR REPAIR  1990's   APPENDECTOMY     ARGON LASER APPLICATION  02/05/2021  Procedure: ARGON LASER APPLICATION;  Surgeon: Leigh Elspeth SQUIBB, MD;  Location: [facility];  Service: Gastroenterology;;   BREAST LUMPECTOMY  1998   left   BREAST SURGERY N/A    Phreesia 10/21/2019   BRONCHIAL WASHINGS  04/05/2020   Procedure: BRONCHIAL WASHINGS;  Surgeon: Kara Dorn NOVAK, MD;  Location: [facility];  Service: Pulmonary;;   CAPSULOTOMY Right 08/28/2021   Procedure: CAPSULOTOMY;  Surgeon: Tobie Franky SQUIBB, DPM;  Location[facility];  Service: Podiatry;  Laterality: Right;   CARPOMETACARPEL (CMC) FUSION OF THUMB WITH AUTOGRAFT FROM RADIUS  ~ 2009   both thumbs (02/20/2012)   CATARACT EXTRACTION W/ INTRAOCULAR LENS  IMPLANT, BILATERAL  2012   CERVICAL DISCECTOMY  10/2001   C5-C6   CERVICAL FUSION  2003   C3-C4   CHOLECYSTECTOMY     COLONOSCOPY     CORONARY STENT INTERVENTION N/A 01/11/2021   Procedure: CORONARY STENT INTERVENTION;  Surgeon: Anner Alm ORN, MD;  Location: [facility] INVASIVE CV LAB;  Service: Cardiovascular;  Laterality: N/A;   DEBRIDEMENT TENNIS ELBOW  ?1970's   right   ESOPHAGOGASTRODUODENOSCOPY      ESOPHAGOGASTRODUODENOSCOPY (EGD) WITH PROPOFOL  N/A 02/05/2021   Procedure: ESOPHAGOGASTRODUODENOSCOPY (EGD) WITH PROPOFOL ;  Surgeon: Leigh Elspeth SQUIBB, MD;  Location[facility];  Service: Gastroenterology;  Laterality: N/A;   EYE SURGERY N/A    Phreesia 10/21/2019   HAMMER TOE SURGERY Right 08/28/2021   Procedure: HAMMER TOE REPAIR 2ND TOE RIGHT FOOT;  Surgeon: Tobie Franky SQUIBB, DPM;  [facility];  Service: Podiatry;  Laterality: Right;   HYSTERECTOMY     I & D EXTREMITY Right 09/22/2020   Procedure: IRRIGATION AND DEBRIDEMENT RIGHT HAND AND ELBOW;  Surgeon: Josefina Chew, MD;  Location[facility];  Service: Orthopedics;  Laterality: Right;   ICD IMPLANT N/A 08/27/2022   Procedure: ICD IMPLANT;  Surgeon: Nancey Eulas BRAVO, MD;  [facility];  Service: Cardiovascular;  Laterality: N/A;   KNEE ARTHROPLASTY  ?8009'd   ?right; w/cartilage repair (02/20/2012)   MASS EXCISION Left 08/28/2021   Procedure: EXCISION OF SOFT TISSUE  MASS LEFT FOOT;  Surgeon: Tobie Franky SQUIBB, DPM;  [facility];  Service: Podiatry;  Laterality: Left;   NASAL SEPTUM SURGERY  1980's   POSTERIOR CERVICAL FUSION/FORAMINOTOMY  2004   failed initial fusion; rewired  anterior neck (02/20/2012)   REVERSE SHOULDER ARTHROPLASTY Right 02/16/2020   Procedure: REVERSE SHOULDER ARTHROPLASTY;  Surgeon: Josefina Chew, MD;  [facility];  Service: Orthopedics;  Laterality: Right;   RIGHT/LEFT HEART CATH AND CORONARY ANGIOGRAPHY N/A 01/11/2021   Procedure: RIGHT/LEFT HEART CATH AND CORONARY ANGIOGRAPHY;  Surgeon: Anner Alm ORN, MD;  Location: [facility] INVASIVE CV LAB;  Service: Cardiovascular;  Laterality: N/A;   SPINE SURGERY N/A    Phreesia 10/21/2019   TONSILLECTOMY  ~ 1953   TRANSESOPHAGEAL ECHOCARDIOGRAM (CATH LAB) N/A 08/12/2023   Procedure: TRANSESOPHAGEAL ECHOCARDIOGRAM;  Surgeon: Pietro Redell RAMAN, MD;  Location: [facility] INVASIVE CV LAB;  Service: Cardiovascular;  Laterality: N/A;   VESICOVAGINAL FISTULA  CLOSURE W/ TAH  1988   VIDEO BRONCHOSCOPY Bilateral 08/23/2016   Procedure: VIDEO BRONCHOSCOPY WITH FLUORO;  Surgeon: Noreen Tonnie BRAVO, MD;  [facility];  Service: Cardiopulmonary;  Laterality: Bilateral;   VIDEO BRONCHOSCOPY N/A 04/05/2020   Procedure: VIDEO BRONCHOSCOPY WITHOUT FLUORO;  Surgeon: Kara Dorn NOVAK, MD;  [facility];  Service: Pulmonary;  Laterality: N/A;    Vitals BP (!) 92/53 (BP Location: Right Arm)   Pulse 78   Temp 99.6  F (37.6 C) (Oral)   Resp 16   Ht 5' 2 (1.575 m)   Wt 55.5 kg   SpO2 91%   BMI 22.38 kg/m     Physical Exam Constitutional: Adult female lying in the bed    Comments: HEENT WNL, not in acute distress  Cardiovascular:     Rate and Rhythm: Normal rate and regular rhythm.     Heart sounds:  Pulmonary:     Effort: Pulmonary effort is normal.     Comments:   Abdominal:     Palpations:     Tenderness: Nondistended  Musculoskeletal:        General: No swelling or tenderness in peripheral joints including right prosthetic shoulder  Skin:    Comments: No rashes, ICD pocket okay with no signs of infection.  Neurological:     General: Awake, alert and oriented, grossly nonfocal skin nonfocal  Psychiatric:        Mood and Affect: Mood normal.   Pertinent Microbiology Results for orders placed or performed during the hospital encounter of 08/06/23  Resp panel by RT-PCR (RSV, Flu A&B, Covid) Anterior Nasal Swab     Status: None   Collection Time: 08/06/23  4:04 PM   Specimen: Anterior Nasal Swab  Result Value Ref Range Status   SARS Coronavirus 2 by RT PCR NEGATIVE NEGATIVE Final    Comment: (NOTE) SARS-CoV-2 target nucleic acids are NOT DETECTED.  The SARS-CoV-2 RNA is generally detectable in upper respiratory specimens during the acute phase of infection. The lowest concentration of SARS-CoV-2 viral copies this assay can detect is 138 copies/mL. A negative result does not preclude SARS-Cov-2 infection and  should not be used as the sole basis for treatment or other patient management decisions. A negative result may occur with  improper specimen collection/handling, submission of specimen other than nasopharyngeal swab, presence of viral mutation(s) within the areas targeted by this assay, and inadequate number of viral copies(<138 copies/mL). A negative result must be combined with clinical observations, patient history, and epidemiological information. The expected result is Negative.  Fact Sheet for Patients:  BloggerCourse.com  Fact Sheet for Healthcare Providers:  SeriousBroker.it  This test is no t yet approved or cleared by the [place]  FDA and  has been authorized for detection and/or diagnosis of SARS-CoV-2 by FDA under an Emergency Use Authorization (EUA). This EUA will remain  in effect (meaning this test can be used) for the duration of the COVID-19 declaration under Section 564(b)(1) of the Act, 21 U.S.C.section 360bbb-3(b)(1), unless the authorization is terminated  or revoked sooner.       Influenza A by PCR NEGATIVE NEGATIVE Final   Influenza B by PCR NEGATIVE NEGATIVE Final    Comment: (NOTE) The Xpert Xpress SARS-CoV-2/FLU/RSV plus assay is intended as an aid in the diagnosis of influenza from Nasopharyngeal swab specimens and should not be used as a sole basis for treatment. Nasal washings and aspirates are unacceptable for Xpert Xpress SARS-CoV-2/FLU/RSV testing.  Fact Sheet for Patients: BloggerCourse.com  Fact Sheet for Healthcare Providers: SeriousBroker.it  This test is not yet approved or cleared by the [place]  FDA and has been authorized for detection and/or diagnosis of SARS-CoV-2 by FDA under an Emergency Use Authorization (EUA). This EUA will remain in effect (meaning this test can be used) for the duration of the COVID-19 declaration  under Section 564(b)(1) of the Act, 21 U.S.C. section 360bbb-3(b)(1), unless the authorization is terminated or revoked.     Resp Syncytial Virus  by PCR NEGATIVE NEGATIVE Final    Comment: (NOTE) Fact Sheet for Patients: BloggerCourse.com  Fact Sheet for Healthcare Providers: SeriousBroker.it  This test is not yet approved or cleared by the [place]  FDA and has been authorized for detection and/or diagnosis of SARS-CoV-2 by FDA under an Emergency Use Authorization (EUA). This EUA will remain in effect (meaning this test can be used) for the duration of the COVID-19 declaration under Section 564(b)(1) of the Act, 21 U.S.C. section 360bbb-3(b)(1), unless the authorization is terminated or revoked.  Performed at [facility], [address], [place], [place] [address]   Blood Culture (routine x 2)     Status: Abnormal   Collection Time: 08/06/23  6:41 PM   Specimen: Site Not Specified; Blood  Result Value Ref Range Status   Specimen Description   Final    SITE NOT SPECIFIED Performed at [facility], [address] [place], [place] [address]    Special Requests   Final    BOTTLES DRAWN AEROBIC ONLY Blood Culture results may not be optimal due to an inadequate volume of blood received in culture bottles Performed at [facility], [address], [place], [place] [address]    Culture  Setup Time   Final    GRAM POSITIVE COCCI IN CHAINS AEROBIC BOTTLE ONLY CRITICAL RESULT CALLED TO, READ BACK BY AND VERIFIED WITH: PHARMD TERRY GREEN ON 08/07/23 @ 1227 BY DRT Performed at [facility], [address], [place], [place] [address]    Culture ENTEROCOCCUS FAECALIS (A)  Final   Report Status 08/09/2023 FINAL  Final   Organism ID, Bacteria ENTEROCOCCUS FAECALIS  Final      Susceptibility   Enterococcus faecalis - MIC*    AMPICILLIN  <=2 SENSITIVE Sensitive      VANCOMYCIN  1 SENSITIVE Sensitive     GENTAMICIN  SYNERGY SENSITIVE Sensitive     * ENTEROCOCCUS FAECALIS  Blood Culture ID Panel (Reflexed)     Status: Abnormal   Collection Time: 08/06/23  6:41 PM  Result Value Ref Range Status   Enterococcus faecalis DETECTED (A) NOT DETECTED Final    Comment: CRITICAL RESULT CALLED TO, READ BACK BY AND VERIFIED WITH: PHARMD TERRY GREEN ON 08/07/23 @ 1231 BY DRT    Enterococcus Faecium NOT DETECTED NOT DETECTED Final   Listeria monocytogenes NOT DETECTED NOT DETECTED Final   Staphylococcus species NOT DETECTED NOT DETECTED Final   Staphylococcus aureus (BCID) NOT DETECTED NOT DETECTED Final   Staphylococcus epidermidis NOT DETECTED NOT DETECTED Final   Staphylococcus lugdunensis NOT DETECTED NOT DETECTED Final   Streptococcus species NOT DETECTED NOT DETECTED Final   Streptococcus agalactiae NOT DETECTED NOT DETECTED Final   Streptococcus pneumoniae NOT DETECTED NOT DETECTED Final   Streptococcus pyogenes NOT DETECTED NOT DETECTED Final   A.calcoaceticus-baumannii NOT DETECTED NOT DETECTED Final   Bacteroides fragilis NOT DETECTED NOT DETECTED Final   Enterobacterales NOT DETECTED NOT DETECTED Final   Enterobacter cloacae complex NOT DETECTED NOT DETECTED Final   Escherichia coli NOT DETECTED NOT DETECTED Final   Klebsiella aerogenes NOT DETECTED NOT DETECTED Final   Klebsiella oxytoca NOT DETECTED NOT DETECTED Final   Klebsiella pneumoniae NOT DETECTED NOT DETECTED Final   Proteus species NOT DETECTED NOT DETECTED Final   Salmonella species NOT DETECTED NOT DETECTED Final   Serratia marcescens NOT DETECTED NOT DETECTED Final   Haemophilus influenzae NOT DETECTED NOT DETECTED Final   Neisseria meningitidis NOT DETECTED NOT DETECTED Final  Pseudomonas aeruginosa NOT DETECTED NOT DETECTED Final   Stenotrophomonas maltophilia NOT DETECTED NOT DETECTED Final   Candida albicans NOT DETECTED NOT DETECTED Final   Candida auris NOT DETECTED NOT DETECTED  Final   Candida glabrata NOT DETECTED NOT DETECTED Final   Candida krusei NOT DETECTED NOT DETECTED Final   Candida parapsilosis NOT DETECTED NOT DETECTED Final   Candida tropicalis NOT DETECTED NOT DETECTED Final   Cryptococcus neoformans/gattii NOT DETECTED NOT DETECTED Final   Vancomycin  resistance NOT DETECTED NOT DETECTED Final    Comment: Performed at [facility], [address] [place], [place] [address]  Respiratory (~20 pathogens) panel by PCR     Status: None   Collection Time: 08/06/23  8:31 PM   Specimen: Nasal Mucosa; Respiratory  Result Value Ref Range Status   Adenovirus NOT DETECTED NOT DETECTED Final   Coronavirus 229E NOT DETECTED NOT DETECTED Final    Comment: (NOTE) The Coronavirus on the Respiratory Panel, DOES NOT test for the novel  Coronavirus (2019 nCoV)    Coronavirus HKU1 NOT DETECTED NOT DETECTED Final   Coronavirus NL63 NOT DETECTED NOT DETECTED Final   Coronavirus OC43 NOT DETECTED NOT DETECTED Final   Metapneumovirus NOT DETECTED NOT DETECTED Final   Rhinovirus / Enterovirus NOT DETECTED NOT DETECTED Final   Influenza A NOT DETECTED NOT DETECTED Final   Influenza B NOT DETECTED NOT DETECTED Final   Parainfluenza Virus 1 NOT DETECTED NOT DETECTED Final   Parainfluenza Virus 2 NOT DETECTED NOT DETECTED Final   Parainfluenza Virus 3 NOT DETECTED NOT DETECTED Final   Parainfluenza Virus 4 NOT DETECTED NOT DETECTED Final   Respiratory Syncytial Virus NOT DETECTED NOT DETECTED Final   Bordetella pertussis NOT DETECTED NOT DETECTED Final   Bordetella Parapertussis NOT DETECTED NOT DETECTED Final   Chlamydophila pneumoniae NOT DETECTED NOT DETECTED Final   Mycoplasma pneumoniae NOT DETECTED NOT DETECTED Final    Comment: Performed at [facility], [address] [place], [place] [address]  MRSA Next Gen by PCR, Nasal     Status: None   Collection Time: 08/06/23  8:31 PM   Specimen: Nasal Mucosa; Nasal Swab  Result Value Ref Range Status    MRSA by PCR Next Gen NOT DETECTED NOT DETECTED Final    Comment: (NOTE) The GeneXpert MRSA Assay (FDA approved for NASAL specimens only), is one component of a comprehensive MRSA colonization surveillance program. It is not intended to diagnose MRSA infection nor to guide or monitor treatment for MRSA infections. Test performance is not FDA approved in patients less than 65 years old. Performed at [facility], [address] [place], [place] [address]   Blood Culture (routine x 2)     Status: Abnormal   Collection Time: 08/06/23  8:43 PM   Specimen: BLOOD RIGHT HAND  Result Value Ref Range Status   Specimen Description   Final    BLOOD RIGHT HAND Performed at [facility], [address], [place], [place] [address]    Special Requests   Final    BOTTLES DRAWN AEROBIC AND ANAEROBIC Blood Culture results may not be optimal due to an inadequate volume of blood received in culture bottles Performed at [facility], [address] [place], [place] [address]    Culture  Setup Time   Final    GRAM POSITIVE COCCI IN CHAINS ANAEROBIC BOTTLE ONLY CRITICAL VALUE NOTED.  VALUE IS CONSISTENT WITH PREVIOUSLY REPORTED AND CALLED VALUE. VONNE  POSITIVE COCCI IN CLUSTERS AEROBIC BOTTLE ONLY Organism ID to follow    Culture (A)  Final    ENTEROCOCCUS FAECALIS SUSCEPTIBILITIES PERFORMED ON PREVIOUS CULTURE WITHIN THE LAST 5 DAYS. STAPHYLOCOCCUS EPIDERMIDIS THE SIGNIFICANCE OF ISOLATING THIS ORGANISM FROM A SINGLE SET OF BLOOD CULTURES WHEN MULTIPLE SETS ARE DRAWN IS UNCERTAIN. PLEASE NOTIFY THE MICROBIOLOGY DEPARTMENT WITHIN ONE WEEK IF SPECIATION AND SENSITIVITIES ARE REQUIRED. Performed at [facility], [address] [place], [place] [address]    Report Status 08/09/2023 FINAL  Final  Culture, blood (Routine X 2) w Reflex to ID Panel     Status: None   Collection Time: 08/08/23  1:11 PM   Specimen: BLOOD RIGHT HAND  Result Value Ref Range  Status   Specimen Description BLOOD RIGHT HAND  Final   Special Requests   Final    BOTTLES DRAWN AEROBIC ONLY Blood Culture adequate volume   Culture   Final    NO GROWTH 5 DAYS Performed at [facility], [address] [place], [place] [address]    Report Status 08/13/2023 FINAL  Final  Culture, blood (Routine X 2) w Reflex to ID Panel     Status: None   Collection Time: 08/08/23  1:12 PM   Specimen: BLOOD RIGHT HAND  Result Value Ref Range Status   Specimen Description BLOOD RIGHT HAND  Final   Special Requests   Final    BOTTLES DRAWN AEROBIC ONLY Blood Culture adequate volume   Culture   Final    NO GROWTH 5 DAYS Performed at [facility], [address] [place], [place] [address]    Report Status 08/13/2023 FINAL  Final   *Note: Due to a large number of results and/or encounters for the requested time period, some results have not been displayed. A complete set of results can be found in Results Review.    Pertinent Lab.    Latest Ref Rng & Units 08/12/2023    6:57 AM 08/07/2023    4:30 AM 08/06/2023    4:11 PM  CBC  WBC 4.0 - 10.5 K/uL 11.5  7.3  9.0   Hemoglobin 12.0 - 15.0 g/dL 9.0  89.8  9.7   Hematocrit 36.0 - 46.0 % 28.6  32.5  31.5   Platelets 150 - 400 K/uL 149  113  140       Latest Ref Rng & Units 08/14/2023    4:49 AM 08/13/2023    4:28 AM 08/12/2023    6:57 AM  CMP  Glucose 70 - 99 mg/dL 87  88  82   BUN 8 - 23 mg/dL 19  19  17    Creatinine 0.44 - 1.00 mg/dL 8.46  8.40  8.63   Sodium 135 - 145 mmol/L 137  137  140   Potassium 3.5 - 5.1 mmol/L 3.6  3.5  3.7   Chloride 98 - 111 mmol/L 103  104  111   CO2 22 - 32 mmol/L 25  24  20    Calcium  8.9 - 10.3 mg/dL 8.7  8.6  8.1   Total Protein 6.5 - 8.1 g/dL 6.2  5.8  5.4   Total Bilirubin 0.0 - 1.2 mg/dL 0.6  0.5  0.6   Alkaline Phos 38 - 126 U/L 36  34  29   AST 15 - 41 U/L 38  47  44   ALT 0 - 44 U/L 29  34  32      Pertinent Imaging  today Plain films and CT images have been personally  visualized and interpreted; radiology reports have been reviewed. Decision making incorporated into the Impression /   US  EKG SITE RITE Result Date: 08/14/2023 If Site Rite image not attached, placement could not be confirmed due to current cardiac rhythm.  I spent 50 minutes involved in face-to-face and non-face-to-face activities for this patient on the day of the visit. Professional time spent includes the following activities: Preparing to see the patient (review of tests), Obtaining and reviewing separately obtained history (admission/discharge record), Performing a medically appropriate examination and evaluation , Ordering medications/labs, referring and communicating with other health care professionals, Documenting clinical information in the EMR, Independently interpreting results (not separately reported), Communicating results to the patient/family/caregiver, Counseling and educating the patient/family/caregiver and Care coordination (not separately reported).   Plan d/w requesting provider as well as ID pharm D  Of note, portions of this note may have been created with voice recognition software. While this note has been edited for accuracy, occasional wrong-word or 'sound-a-like' substitutions may have occurred due to the inherent limitations of voice recognition software.   Electronically signed by:   Annalee Orem, MD Infectious Disease Physician [facility] for Infectious Disease Pager: [phone]

## 2023-08-15 DIAGNOSIS — I5022 Chronic systolic (congestive) heart failure: Secondary | ICD-10-CM | POA: Diagnosis not present

## 2023-08-15 DIAGNOSIS — E861 Hypovolemia: Secondary | ICD-10-CM | POA: Diagnosis not present

## 2023-08-15 LAB — CBC
HCT: 30.4 % — ABNORMAL LOW (ref 36.0–46.0)
Hemoglobin: 9.8 g/dL — ABNORMAL LOW (ref 12.0–15.0)
MCH: 31.9 pg (ref 26.0–34.0)
MCHC: 32.2 g/dL (ref 30.0–36.0)
MCV: 99 fL (ref 80.0–100.0)
Platelets: 179 10*3/uL (ref 150–400)
RBC: 3.07 MIL/uL — ABNORMAL LOW (ref 3.87–5.11)
RDW: 15.5 % (ref 11.5–15.5)
WBC: 12.4 10*3/uL — ABNORMAL HIGH (ref 4.0–10.5)
nRBC: 0 % (ref 0.0–0.2)

## 2023-08-15 LAB — BASIC METABOLIC PANEL WITH GFR
Anion gap: 15 (ref 5–15)
BUN: 23 mg/dL (ref 8–23)
CO2: 23 mmol/L (ref 22–32)
Calcium: 8.8 mg/dL — ABNORMAL LOW (ref 8.9–10.3)
Chloride: 99 mmol/L (ref 98–111)
Creatinine, Ser: 2.1 mg/dL — ABNORMAL HIGH (ref 0.44–1.00)
GFR, Estimated: 24 mL/min — ABNORMAL LOW (ref >60)
Glucose, Bld: 240 mg/dL — ABNORMAL HIGH (ref 70–99)
Potassium: 3.8 mmol/L (ref 3.5–5.1)
Sodium: 137 mmol/L (ref 135–145)

## 2023-08-15 LAB — GLUCOSE, CAPILLARY
Glucose-Capillary: 94 mg/dL (ref 70–99)
Glucose-Capillary: 123 mg/dL — ABNORMAL HIGH (ref 70–99)
Glucose-Capillary: 172 mg/dL — ABNORMAL HIGH (ref 70–99)
Glucose-Capillary: 188 mg/dL — ABNORMAL HIGH (ref 70–99)

## 2023-08-15 MED ORDER — PREDNISONE 5 MG PO TABS
5.0000 mg | ORAL_TABLET | Freq: Once | ORAL | Status: AC
  Administered 2023-08-15: 5 mg via ORAL
  Filled 2023-08-15: qty 1

## 2023-08-15 MED ORDER — SODIUM CHLORIDE 0.9 % IV BOLUS
250.0000 mL | Freq: Once | INTRAVENOUS | Status: AC
  Administered 2023-08-15: 250 mL via INTRAVENOUS

## 2023-08-15 MED ORDER — PREDNISONE 10 MG PO TABS
10.0000 mg | ORAL_TABLET | Freq: Every day | ORAL | Status: DC
  Administered 2023-08-16: 10 mg via ORAL
  Filled 2023-08-15: qty 1

## 2023-08-15 NOTE — Progress Notes (Addendum)
 Progress Note  Patient Name: Ebony Scott Date of Encounter: 08/15/2023 [facility] Cardiologist: Shelda Bruckner, MD   History of Present Illness   Patient has been admitted since 08/06/2023, has been followed by our EP team with history of ICM with ICD Found to have bacteremia, TEE negative for vegetations, planned for device extraction 7/2 General cardiology was asked to re-round and give input in the setting of medication management for CHF   Interval Summary Patient resting comfortably in bed, on room air  Has been taking PO Lasix  daily since 6/22, typically only takes it twice weekly and PRN at home Appears euvolemic today No issues with LE edema, chest pain, shortness of breath   Vital Signs Vitals:   08/14/23 2321 08/15/23 0434 08/15/23 0758 08/15/23 1115  BP: (!) 97/51 (!) 120/47 (!) 128/50 (!) 103/52  Pulse: 74 75  77  Resp: 16 16  18   Temp: 99.4 F (37.4 C) 98.4 F (36.9 C) 98.7 F (37.1 C) 98.8 F (37.1 C)  TempSrc: Oral Oral Oral Oral  SpO2: 97% 95% 91% 94%  Weight:      Height:       Intake/Output Summary (Last 24 hours) at 08/15/2023 1428 Last data filed at 08/15/2023 0405 Gross per 24 hour  Intake 800 ml  Output 300 ml  Net 500 ml      08/09/2023   10:54 PM 08/07/2023   11:00 PM 08/02/2023    2:12 PM  Last 3 Weights  Weight (lbs) 122 lb 5.7 oz 130 lb 1.1 oz 130 lb  Weight (kg) 55.5 kg 59 kg 58.968 kg     Telemetry/ECG  Sinus rhythm, HR 70s - Personally Reviewed  Physical Exam  GEN: No acute distress.   Neck: No JVD Cardiac: RRR, no murmurs, rubs, or gallops.  Respiratory: Clear to auscultation bilaterally. GI: Soft, nontender, non-distended  MS: No edema  Assessment & Plan   Chronic HFrEF Ischemic cardiomyopathy s/p ICD scheduled for removal 7/2 Patient presented with severe weakness, fevers, chills  Found to have bacteremia in the setting of recurrent pulmonary infections and chronic immunosuppression  TEE this admission  showed: no vegetations, LVEF 25-30%, normal RV function Scheduled for device removal per Dr. Waddell on 7/2, see EP note for details General cardiology asked to re-round on patient prior to likely discharge  Home meds: Jardiance  10 mg daily, PO Lasix  40 mg twice weekly/PRN, BiDil  20-36.5 mg BID, Entresto  24-26 mg BID Most recent BP 103/52, HR 77 Creatinine stable Been on daily Lasix  since 6/22  She reports that she had good urine output at first but has leveled off  Appears euvolemic on exam  Currently on Jardiance  10 mg daily, PO Lasix  40 mg daily, BiDil  20-37.5 mg BID, Entresto  24-26 mg BID Recommend returning to PTA dosing of PO Lasix  40 mg twice weekly and PRN Educated patient on daily weights, which she says she does do, and when to take an additional PRN dose of Lasix  as needed  Per primary Bacteremia Sepsis due to CAP AKI on CKD stage 3b Acute on chronic respiratory failure Hypotension History of PE, DVT Hypogammaglobulinemia  Diabetes Rheumatoid arthritis  Hyperlipidemia    For questions or updates, please contact [facility] Please consult [email or web address] for contact info under       Signed, Waddell [name], PA-C   History and all data above reviewed.  Patient examined.  I agree with the findings as above.  Weak.  No pain.  No acute  SOB.   The patient exam reveals COR:RRR, 3/6 apical systolic murmur, no diastolic murmurs  ,  Lungs: Clear  ,  Abd: Positive bowel sounds, no rebound no guarding, Ext No edema  .  All available labs, radiology testing, previous records reviewed. Agree with documented assessment and plan. Chronic systolic HF:  BP low this afternoon.  I am hoping that with discontinuation of the diuretic we can continue her current meds.  Hold orders are in place.  If BP remains low we will likely hold the BiDil .    Lynwood Schilling  2:54 PM  08/15/2023

## 2023-08-15 NOTE — Progress Notes (Signed)
 PT Cancellation Note  Patient Details Name: Ebony Scott MRN: [id number] DOB: 1950-09-28   Cancelled Treatment:    Reason Eval/Treat Not Completed: (P) Pain limiting ability to participate Pt RN request PT follow back after she has received pain medication.    [name] Fleet 08/15/2023, 10:29 AM

## 2023-08-15 NOTE — TOC Progression Note (Signed)
 Transition of Care [facility]) - Progression Note    Patient Details  Name: Ebony Scott MRN: [id number] Date of Birth: June 14, 1950  Transition of Care [facility]) CM/SW Contact  Gwenn Frieze [place], [place] Phone Number: 08/15/2023, 12:49 PM  Clinical Narrative: Spoke with [name] updated rec for SNF. Pt reports agreeable to SNF for STR. Reviewed SNF placement process and answered questions. Pt with prior SNF stay at [facility]. Will pursue SNF placement and f/u with offers to pt as available.   Frieze Gwenn, MSW, LCSW [phone] (coverage)        Expected Discharge Plan: Skilled Nursing Facility Barriers to Discharge: Continued Medical Work up  Expected Discharge Plan and Services In-house Referral: NA Discharge Planning Services: CM Consult Post Acute Care Choice: Home Health, Resumption of Svcs/PTA Provider Living arrangements for the past 2 months: Single Family Home                 DME Arranged: IV pump/equipment DME Agency:  [facility]) Date DME Agency Contacted: 08/14/23 Time DME Agency Contacted: 1154 Representative spoke with at DME Agency: Pam HH Arranged: RN, Disease Management, PT, OT, Nurse's Aide HH Agency: [facility] Date [facility] Agency Contacted: 08/14/23 Time HH Agency Contacted: 1159 Representative spoke with at [facility] Agency: Arna   Social Determinants of Health (SDOH) Interventions SDOH Screenings   Food Insecurity: No Food Insecurity (08/07/2023)  Housing: Low Risk  (08/07/2023)  Transportation Needs: No Transportation Needs (08/07/2023)  Utilities: Not At Risk (08/07/2023)  Alcohol  Screen: Low Risk  (11/22/2022)  Depression (PHQ2-9): Low Risk  (05/29/2023)  Financial Resource Strain: Low Risk  (03/26/2023)  Physical Activity: Inactive (03/26/2023)  Social Connections: Socially Isolated (08/07/2023)  Stress: Stress Concern Present (03/26/2023)  Tobacco Use: Low Risk  (08/12/2023)  Health Literacy: Adequate Health Literacy (11/22/2022)    Readmission Risk  Interventions    08/14/2023   11:49 AM 05/20/2023   12:38 PM 12/06/2022    2:19 PM  Readmission Risk Prevention Plan  Transportation Screening Complete Complete Complete  PCP or Specialist Appt within 3-5 Days   Complete  HRI or Home Care Consult   Complete  Social Work Consult for Recovery Care Planning/Counseling   Complete  Palliative Care Screening   Complete  Medication Review [facility]) Referral to Pharmacy Complete Complete  PCP or Specialist appointment within 3-5 days of discharge  Complete   HRI or Home Care Consult Complete Complete   SW Recovery Care/Counseling Consult Complete Complete   Palliative Care Screening Not Applicable Not Applicable   Skilled Nursing Facility Not Applicable Complete

## 2023-08-15 NOTE — TOC Progression Note (Signed)
 Transition of Care [facility]) - Progression Note    Patient Details  Name: Ebony Scott MRN: [id number] Date of Birth: 08-09-1950  Transition of Care [facility]) CM/SW Contact  Graves-Bigelow, Erminio Deems, RN Phone Number: 08/15/2023, 12:42 PM  Clinical Narrative: Case Manager received notification that the patient will need SNF at discharge. Patient is agreeable to SNF. Case Manager did make [facility] and [facility] aware that the plan is now for SNF. [facility] did call the patients daughter Charlies' and she is aware of the new plan. CSW to follow for for SNF needs.   Expected Discharge Plan: Skilled Nursing Facility Barriers to Discharge: Continued Medical Work up  Expected Discharge Plan and Services In-house Referral: NA Discharge Planning Services: CM Consult Post Acute Care Choice: Home Health, Resumption of Svcs/PTA Provider Living arrangements for the past 2 months: Single Family Home                 DME Arranged: IV pump/equipment DME Agency:  [facility]) Date DME Agency Contacted: 08/14/23 Time DME Agency Contacted: 1154 Representative spoke with at DME Agency: Pam HH Arranged: RN, Disease Management, PT, OT, Nurse's Aide HH Agency: [facility] Date [facility] Agency Contacted: 08/14/23 Time HH Agency Contacted: 1159 Representative spoke with at [facility] Agency: Arna   Social Determinants of Health (SDOH) Interventions SDOH Screenings   Food Insecurity: No Food Insecurity (08/07/2023)  Housing: Low Risk  (08/07/2023)  Transportation Needs: No Transportation Needs (08/07/2023)  Utilities: Not At Risk (08/07/2023)  Alcohol  Screen: Low Risk  (11/22/2022)  Depression (PHQ2-9): Low Risk  (05/29/2023)  Financial Resource Strain: Low Risk  (03/26/2023)  Physical Activity: Inactive (03/26/2023)  Social Connections: Socially Isolated (08/07/2023)  Stress: Stress Concern Present (03/26/2023)  Tobacco Use: Low Risk  (08/12/2023)  Health Literacy: Adequate Health Literacy (11/22/2022)    Readmission Risk  Interventions    08/14/2023   11:49 AM 05/20/2023   12:38 PM 12/06/2022    2:19 PM  Readmission Risk Prevention Plan  Transportation Screening Complete Complete Complete  PCP or Specialist Appt within 3-5 Days   Complete  HRI or Home Care Consult   Complete  Social Work Consult for Recovery Care Planning/Counseling   Complete  Palliative Care Screening   Complete  Medication Review Oceanographer) Referral to Pharmacy Complete Complete  PCP or Specialist appointment within 3-5 days of discharge  Complete   HRI or Home Care Consult Complete Complete   SW Recovery Care/Counseling Consult Complete Complete   Palliative Care Screening Not Applicable Not Applicable   Skilled Nursing Facility Not Applicable Complete

## 2023-08-15 NOTE — Progress Notes (Addendum)
 EP Final Recommendations:    Patient is arranged for device extraction on 08/21/23 with Dr. Waddell. Follow up post device removal will be arranged.    -Pt should hold Xarelto  on 6/30, 7/1 and 7/2.  -NPO after MN on 7/2 -Chlorhexidine  scrub pre-procedure  -hold Plavix  after 6/28    Daphne Barrack, NP-C, AGACNP-BC [facility] - Electrophysiology  08/15/2023, 10:48 AM

## 2023-08-15 NOTE — NC FL2 (Signed)
 [place]  MEDICAID FL2 LEVEL OF CARE FORM     IDENTIFICATION  Patient Name: Ebony Scott Birthdate: 26-Jul-1950 Sex: female Admission Date (Current Location): 08/06/2023  [facility] and [place] Number:  Producer, television/film/video and Address:  [facility], [address] [place], [place] [address]      Provider Number: [id number]  Attending Physician Name and Address:  Judeth Trenda BIRCH, MD  Relative Name and Phone Number:       Current Level of Care: Hospital Recommended Level of Care: Skilled Nursing Facility Prior Approval Number:    Date Approved/Denied:   PASRR Number: [id number]  Discharge Plan: SNF    Current Diagnoses: Patient Active Problem List   Diagnosis Date Noted   Enterococcus faecalis infection 08/08/2023   Enterococcal bacteremia 08/08/2023   Community acquired pneumonia 08/06/2023   Generalized weakness 08/06/2023   AKI (acute kidney injury) (HCC) 08/06/2023   Acute metabolic acidosis 05/20/2023   RLL pneumonia 05/18/2023   Nocturnal hypoxia - 2-3 L/min 05/18/2023   Acute respiratory failure with hypoxia (HCC) 05/18/2023   Current chronic use of systemic steroids - prednisone  7 mg daily for hx of RA 05/18/2023   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   Sepsis without acute organ dysfunction (HCC) 11/30/2022   Fever 09/01/2022   DNR (do not resuscitate) 08/31/2022   Noninfectious otitis externa of left ear 06/08/2021   Iron  deficiency anemia 02/24/2021   GAVE (gastric antral vascular ectasia)    Anticoagulated    Antiplatelet or antithrombotic long-term use    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   Coronary artery calcification seen on CAT scan 01/11/2021   History of DVT (deep vein thrombosis) 09/24/2020   DMII (diabetes mellitus, type 2) (HCC) 09/23/2020   Hoarseness 09/09/2020   S/P reverse total shoulder arthroplasty, right    DOE (dyspnea on exertion) 02/18/2020   Osteoarthritis of right shoulder  02/16/2020   S/P reverse total shoulder arthroplasty, left 02/16/2020   History of pulmonary embolus (PE) 12/19/2018   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   Rheumatoid arthritis (HCC)    Bilateral impacted cerumen 08/18/2018   Hypothyroidism 01/14/2018   Depression 01/14/2018   Debility    Protein-calorie malnutrition, moderate (HCC) 12/17/2017   Knee pain    History of breast cancer    Chronic pain syndrome    Fibromyalgia    Graves disease    Dysphonia 08/10/2017   Pulmonary nodules    Chronic kidney disease, stage 3b (HCC) - baseline Scr 1.3-1.5 04/15/2016   Hx of adenomatous colonic polyps 04/12/2016   Opacity of lung on imaging study 11/03/2015   Severe persistent asthma 02/08/2015   Facial pain syndrome 02/08/2015   Insomnia 02/08/2015   Other allergic rhinitis 02/08/2015   LPRD (laryngopharyngeal reflux disease) 02/08/2015   Chronic systolic CHF (congestive heart failure) (HCC) 02/08/2015   Diastolic dysfunction 02/08/2015   Benign paroxysmal positional vertigo 12/03/2012   Disequilibrium 10/29/2012   Pseudogout 02/24/2012   Irritable bowel syndrome 01/02/2010   Hyperlipidemia 12/23/2009   Hyperthyroidism 11/12/2006   Seasonal and perennial allergic rhinitis 11/12/2006   GERD 11/12/2006   NECK PAIN, CHRONIC 11/12/2006   OSTEOPOROSIS 11/12/2006   BREAST CANCER, HX OF 11/12/2006    Orientation RESPIRATION BLADDER Height & Weight     Self, Time, Situation, Place    Continent Weight: 122 lb 5.7 oz (55.5 kg) Height:  5' 2 (157.5 cm)  BEHAVIORAL SYMPTOMS/MOOD NEUROLOGICAL BOWEL NUTRITION STATUS      Continent    AMBULATORY STATUS  COMMUNICATION OF NEEDS Skin   Limited Assist Verbally Normal                       Personal Care Assistance Level of Assistance  Bathing, Feeding, Dressing Bathing Assistance: Limited assistance Feeding assistance: Limited assistance Dressing Assistance: Limited assistance     Functional Limitations Info  Sight, Hearing, Speech  Sight Info: Adequate Hearing Info: Adequate Speech Info: Adequate    SPECIAL CARE FACTORS FREQUENCY  PT (By licensed PT), OT (By licensed OT)                    Contractures Contractures Info: Not present    Additional Factors Info  Code Status Code Status Info: DNR             Current Medications (08/15/2023):  This is the current hospital active medication list Current Facility-Administered Medications  Medication Dose Route Frequency Provider Last Rate Last Admin   acetaminophen  (TYLENOL ) tablet 650 mg  650 mg Oral Q6H PRN Pietro Redell RAMAN, MD   650 mg at 08/15/23 1047   Or   acetaminophen  (TYLENOL ) suppository 650 mg  650 mg Rectal Q6H PRN Pietro Redell RAMAN, MD       acidophilus (RISAQUAD) capsule 1 capsule  1 capsule Oral TID Pietro Redell RAMAN, MD   1 capsule at 08/15/23 1048   ampicillin  (OMNIPEN) 2 g in sodium chloride  0.9 % 100 mL IVPB  2 g Intravenous Q6H Gladis Almarie PARAS, RPH 300 mL/hr at 08/15/23 1229 2 g at 08/15/23 1229   atorvastatin  (LIPITOR) tablet 80 mg  80 mg Oral QPM Pietro Redell RAMAN, MD   80 mg at 08/14/23 1754   benzonatate  (TESSALON ) capsule 200 mg  200 mg Oral TID PRN Segars, Jonathan, MD   200 mg at 08/13/23 2308   budesonide -glycopyrrolate -formoterol  (BREZTRI ) 160-9-4.8 MCG/ACT inhaler 2 puff  2 puff Inhalation BID Pietro Redell RAMAN, MD   2 puff at 08/15/23 0836   cefTRIAXone  (ROCEPHIN ) 2 g in sodium chloride  0.9 % 100 mL IVPB  2 g Intravenous Q12H Pietro Redell RAMAN, MD 200 mL/hr at 08/15/23 1100 2 g at 08/15/23 1100   Chlorhexidine  Gluconate Cloth 2 % PADS 6 each  6 each Topical Daily Hongalgi, Anand D, MD   6 each at 08/15/23 1051   cholecalciferol  (VITAMIN D3) 25 MCG (1000 UNIT) tablet 1,000 Units  1,000 Units Oral QHS Pietro Redell RAMAN, MD   1,000 Units at 08/14/23 2104   clopidogrel  (PLAVIX ) tablet 75 mg  75 mg Oral Daily Pietro Redell RAMAN, MD   75 mg at 08/15/23 1049   empagliflozin  (JARDIANCE ) tablet 10 mg  10 mg Oral Daily Pietro Redell RAMAN, MD   10 mg at 08/15/23 1047   fluticasone  (FLONASE ) 50 MCG/ACT nasal spray 2 spray  2 spray Each Nare Daily Pietro Redell RAMAN, MD   2 spray at 08/15/23 1050   folic acid  (FOLVITE ) tablet 1 mg  1 mg Oral Daily Pietro Redell RAMAN, MD   1 mg at 08/15/23 1048   furosemide  (LASIX ) tablet 40 mg  40 mg Oral Daily Hongalgi, Anand D, MD   40 mg at 08/15/23 1049   gabapentin  (NEURONTIN ) capsule 300 mg  300 mg Oral QHS Pietro Redell RAMAN, MD   300 mg at 08/14/23 2104   guaiFENesin  (MUCINEX ) 12 hr tablet 600 mg  600 mg Oral BID Pietro Redell RAMAN, MD   600 mg at 08/15/23 1048   insulin  aspart (  novoLOG ) injection 0-5 Units  0-5 Units Subcutaneous QHS Pietro Redell RAMAN, MD   2 Units at 08/07/23 2142   insulin  aspart (novoLOG ) injection 0-9 Units  0-9 Units Subcutaneous TID WC Pietro Redell RAMAN, MD   1 Units at 08/14/23 1755   ipratropium-albuterol  (DUONEB) 0.5-2.5 (3) MG/3ML nebulizer solution 3 mL  3 mL Nebulization Q6H PRN Pietro Redell RAMAN, MD   3 mL at 08/12/23 2155   isosorbide -hydrALAZINE  (BIDIL ) 20-37.5 MG per tablet 1 tablet  1 tablet Oral BID Hongalgi, Anand D, MD   1 tablet at 08/15/23 1048   loperamide  (IMODIUM ) capsule 4 mg  4 mg Oral PRN Pietro Redell RAMAN, MD   4 mg at 08/15/23 1048   nystatin  cream (MYCOSTATIN )   Topical TID Pietro Redell RAMAN, MD   Given at 08/14/23 1030   ondansetron  (ZOFRAN ) tablet 4 mg  4 mg Oral Q6H PRN Pietro Redell RAMAN, MD       Or   ondansetron  (ZOFRAN ) injection 4 mg  4 mg Intravenous Q6H PRN Pietro Redell RAMAN, MD   4 mg at 08/12/23 1138   pantoprazole  (PROTONIX ) EC tablet 40 mg  40 mg Oral Daily Crenshaw, Brian S, MD   40 mg at 08/15/23 1048   [START ON 08/16/2023] predniSONE  (DELTASONE ) tablet 10 mg  10 mg Oral Q breakfast Hongalgi, Anand D, MD       predniSONE  (DELTASONE ) tablet 5 mg  5 mg Oral Once Hongalgi, Anand D, MD       Rivaroxaban  (XARELTO ) tablet 15 mg  15 mg Oral Q supper Shahmehdi, Seyed A, MD   15 mg at 08/14/23 1755   sacubitril -valsartan  (ENTRESTO ) 24-26 mg  per tablet  1 tablet Oral BID Hongalgi, Anand D, MD   1 tablet at 08/15/23 1048   senna-docusate (Senokot-S) tablet 1 tablet  1 tablet Oral QHS PRN Pietro Redell RAMAN, MD       sodium chloride  0.9 % bolus 250 mL  250 mL Intravenous Once Hongalgi, Anand D, MD       sodium chloride  flush (NS) 0.9 % injection 10-40 mL  10-40 mL Intracatheter Q12H Hongalgi, Anand D, MD   10 mL at 08/15/23 1051   sodium chloride  flush (NS) 0.9 % injection 10-40 mL  10-40 mL Intracatheter PRN Hongalgi, Anand D, MD       Facility-Administered Medications Ordered in Other Encounters  Medication Dose Route Frequency Provider Last Rate Last Admin   iohexol  (OMNIPAQUE ) 350 MG/ML injection    PRN Anner Alm ORN, MD   105 mL at 01/11/21 0955     Discharge Medications: Please see discharge summary for a list of discharge medications.  Relevant Imaging Results:  Relevant Lab Results:   Additional Information SSN: [id number];  ampicillin  and ceftriaxone  for 6 weeks  Zyon Grout [place], [place]

## 2023-08-15 NOTE — Progress Notes (Signed)
 Physical Therapy Treatment Patient Details Name: Ebony Scott MRN: [id number] DOB: 26-Dec-1950 Today's Date: 08/15/2023   History of Present Illness 73 yo female admitted with Pna. Hx of RA-chronic prednision, HF, CAD, chronic resp failure-O2 at night, anemia, chronic pain, fibromyalgia, osteomyelitis, DVT, osteoporosis, breast Ca, CKD, DM    PT Comments  Pt to discharge home alone today after second dose of antibiotics. Pt feels that she will be fine alone until her daughter returns late Sunday night. Simulated home setup with flattened bed and pt required min A for bed mobility, and min A for power up into standing. Pt stands for less than 1 min and request to return to supine due to fatigue. Pt with incontinence of stool in standing. BP in supine prior to mobilization 103/52 BP with return to supine 75/34 (47), BP cuff reapplied and BP 69/39.  Given pt change in mobility tolerance and low BP PT now believes PT will benefit from continued inpatient follow up therapy, <3 hours/day. PT will continue to follow acutely.     If plan is discharge home, recommend the following: A little help with walking and/or transfers;A little help with bathing/dressing/bathroom;Assistance with cooking/housework;Assist for transportation;Help with stairs or ramp for entrance   Can travel by private vehicle      No  Equipment Recommendations  None recommended by PT       Precautions / Restrictions Precautions Precautions: Fall Precaution/Restrictions Comments: oxygen  at night only at baseline Restrictions Weight Bearing Restrictions Per Provider Order: No Other Position/Activity Restrictions: she reports having chronic pain in her feet     Mobility  Bed Mobility Overal bed mobility: Modified Independent Bed Mobility: Supine to Sit, Sit to Supine     Supine to sit: Min assist Sit to supine: Min assist   General bed mobility comments: simulating flattened bed at home, pt requires min A for bringing trunk  to upright and min A for returning LE to bed    Transfers Overall transfer level: Needs assistance Equipment used: Rolling walker (2 wheels) Transfers: Sit to/from Stand Sit to Stand: Min assist           General transfer comment: minA for power up to standing. pt only able to tolerate ~1 min of standing and request to return to supine due to weakness, incontinent of stool while standing    Ambulation/Gait               General Gait Details: unable       Balance Overall balance assessment: Needs assistance Sitting-balance support: No upper extremity supported, Feet unsupported Sitting balance-Leahy Scale: Good     Standing balance support: Bilateral upper extremity supported, During functional activity, Reliant on assistive device for balance Standing balance-Leahy Scale: Fair Standing balance comment: static standing no UE support                            Communication Communication Communication: No apparent difficulties  Cognition Arousal: Alert Behavior During Therapy: WFL for tasks assessed/performed   PT - Cognitive impairments: No apparent impairments                         Following commands: Intact      Cueing Cueing Techniques: Verbal cues     General Comments General comments (skin integrity, edema, etc.): BP with return to supine 75/34 (47) HR 83, BP cuff reapplied and BP 69/39 (49) HR 79, SpO2 on  RA >92%O2 throughout session      Pertinent Vitals/Pain Pain Assessment Pain Assessment: Faces Faces Pain Scale: Hurts even more Pain Location: feet Pain Descriptors / Indicators: Discomfort, Aching, Constant Pain Intervention(s): Limited activity within patient's tolerance, Monitored during session, Repositioned, Premedicated before session     PT Goals (current goals can now be found in the care plan section) Acute Rehab PT Goals Patient Stated Goal: none stated PT Goal Formulation: With patient Time For Goal  Achievement: 08/21/23 Potential to Achieve Goals: Good Progress towards PT goals: Not progressing toward goals - comment    Frequency    Min 2X/week       AM-PAC PT 6 Clicks Mobility   Outcome Measure  Help needed turning from your back to your side while in a flat bed without using bedrails?: None Help needed moving from lying on your back to sitting on the side of a flat bed without using bedrails?: None Help needed moving to and from a bed to a chair (including a wheelchair)?: A Little Help needed standing up from a chair using your arms (e.g., wheelchair or bedside chair)?: A Little Help needed to walk in hospital room?: A Little Help needed climbing 3-5 steps with a railing? : A Little 6 Click Score: 20    End of Session Equipment Utilized During Treatment: Gait belt Activity Tolerance: Patient tolerated treatment well Patient left: with call bell/phone within reach;in bed Nurse Communication: Mobility status PT Visit Diagnosis: Difficulty in walking, not elsewhere classified (R26.2);Muscle weakness (generalized) (M62.81)     Time: 8881-8852 PT Time Calculation (min) (ACUTE ONLY): 29 min  Charges:    $Therapeutic Activity: 8-22 mins PT General Charges $$ ACUTE PT VISIT: 1 Visit                     Cleota Pellerito B. Fleeta Lapidus PT, DPT Acute Rehabilitation Services Please use secure chat or  Call Office [phone]    Almarie KATHEE Fleeta [place] [facility] 08/15/2023, 12:21 PM

## 2023-08-15 NOTE — Progress Notes (Addendum)
 PROGRESS NOTE   Ebony Scott  FMW:[id number]    DOB: Jul 10, 1950    DOA: 08/06/2023  PCP: [name], MD   I have briefly reviewed patients previous medical records in [facility].   Brief Hospital Course:   73 y.o. 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  She is admitted for CIED associated E faecalis bacteremia, community acquired pneumonia, acute respiratory failure and AKI.  ID following.  EP plan extraction of ICD on 7/2.   Assessment & Plan:   CIED associated Enterococcus faecalis bacteremia ID consultation and follow-up appreciated. Continuing IV ampicillin  and ceftriaxone  per OPAT protocol EP follow-up appreciated and plan ICD extraction for 7/2.  As per communication with EP team, patient could be discharged for outpatient follow-up for this procedure. 6/17: 1 of 2 sets of blood cultures positive for Enterococcus faecalis, sensitive to ampicillin , vancomycin  and gentamicin  synergy Surveillance blood cultures x 2 from 6/19: Negative, final report. TEE 6/23: No thrombus.  No vegetation noted on RV lead.  No valvular vegetations.  Sepsis due to community-acquired pneumonia/recurrent pneumonitis Completed IV antibiotic course for same. Patient reportedly has history of immunosuppression and recurrent pneumonias. Pulmonology evaluated.  Acute kidney injury on stage IIIb chronic kidney disease Baseline creatinine reportedly 1.3-1.5. Had presented with creatinine of 2.14, creatinine has improved and probably at  new baseline.  Labs ordered but for some reason not done today.  Check in AM. Trend BMP and avoid nephrotoxins and hypotension.  Acute on chronic respiratory failure with hypoxia/bronchiectasis At baseline patient reportedly uses [place] oxygen  2 to 3 L/min at night. Currently weaned off to room air.  Acute respiratory failure has resolved.  Ischemic cardiomyopathy/chronic HFrEF/s/p ICD Clinically euvolemic. As per EP Cardiology, she will need to continue on Xarelto  through 6/29 and no doses after in anticipation of extraction.  They recommend holding Xarelto  on 6/30, 7/1 and 7/2.  See EP Cardiology note from 6/26 for details.  Hypotension Noted since 6/25.  6/26: Per PT, SBP in the 60s on attempting to stand next to the bed with fecal incontinence.  On laying back in bed, systolics up to 80s.  Patient symptomatic. May be due to multiple meds i.e. Lasix , BiDil , Entresto .  Added holding parameters for evening doses Bolused with IV normal saline 250 x 1.  SBP up to the 90s.  Requested cardiology to reconsult, they have stopped Lasix  and monitor.  If ongoing issues, consider stopping BiDil  as well. See steroid management as below.  History of PE/DVT Management of Xarelto  as noted above  Hypogammaglobulinemia On IVIG as outpatient.  Seropositive rheumatoid arthritis/chronic steroid use Back on prednisone  5 Mg daily after stress dose steroids early on in admission Has a scheduled follow-up with rheumatology on 6/25 but since hospitalized, this will need to be rescheduled Confirmed with patient's daughter that patient is on maintenance prednisone  of 5 Mg daily.  Given possibility of adrenal insufficiency, will give additional dose of 5 mg prednisone  today and increased prednisone  to 10 mg daily briefly until blood pressure stabilizes.  Type II DM, controlled A1c 6.5 on 6/18 CBGs well-controlled, discontinued  SSI.  Chronic pain syndrome Continue  gabapentin   Hypokalemia Replaced.  Generalized weakness/physical deconditioning Presented initially recommended home health PT and OT.  However she has since declined and per PT recommendation on 6/26, SNF is recommended.  TOC on board.  Body mass index is 22.38 kg/m.   DVT prophylaxis:      Code Status: Limited: Do not attempt resuscitation (DNR) -DNR-LIMITED -Do Not Intubate/DNI :  Family Communication: None at bedside.  Unable to reach patient's daughter that I discussed with yesterday, left VM message. Disposition:  Status is: Inpatient Remains inpatient appropriate because: Agement of hypotension as noted above.  Ongoing IV antibiotics.  SNF at discharge.     Consultants:   EP Cardiology Infectious disease Critical care  Procedures:   As noted above  Subjective:  Patient reported feeling weak and dizzy when she got up yesterday with therapies.  Discussed with PT at bedside, on standing up next to her her bed for less than a minute, patient fatigued, incontinent of stools, blood pressure dropped to 69/39.  Patient weak but denied dizziness or near syncope.  Objective:   Vitals:   08/15/23 1430 08/15/23 1452 08/15/23 1454 08/15/23 1546  BP: (!) 82/45 (!) 90/48 (!) 85/46 (!) 103/50  Pulse:      Resp:    20  Temp:    98.8 F (37.1 C)  TempSrc:    Oral  SpO2:      Weight:      Height:        General exam: Elderly female, moderately built and nourished, chronically ill looking, lying comfortably propped up in bed without distress.  Oral mucosa moist. Respiratory system: Clear to auscultation. Respiratory effort normal. Cardiovascular system: S1 & S2 heard, RRR. No JVD, murmurs, rubs, gallops or clicks. No pedal edema.  Telemetry personally reviewed: Sinus rhythm. Gastrointestinal system: Abdomen is nondistended, soft and nontender. No organomegaly or masses felt. Normal bowel sounds heard. Central nervous system: Alert and oriented. No focal neurological  deficits. Extremities: Symmetric 5 x 5 power. Skin: No rashes, lesions or ulcers Psychiatry: Judgement and insight appear normal. Mood & affect appropriate.     Data Reviewed:   I have personally reviewed following labs and imaging studies   CBC: Recent Labs  Lab 08/12/23 0657  WBC 11.5*  NEUTROABS 8.0*  HGB 9.0*  HCT 28.6*  MCV 102.1*  PLT 149*    Basic Metabolic Panel: Recent Labs  Lab 08/09/23 0422 08/10/23 0621 08/12/23 0657 08/13/23 0428 08/14/23 0449  NA 142 142 140 137 137  K 3.2* 3.3* 3.7 3.5 3.6  CL 110 114* 111 104 103  CO2 20* 20* 20* 24 25  GLUCOSE 165* 92 82 88 87  BUN 33* 25* 17 19 19   CREATININE 1.51* 1.18* 1.36* 1.59* 1.53*  CALCIUM  7.9* 7.8* 8.1* 8.6* 8.7*    Liver Function Tests: Recent Labs  Lab 08/12/23 0657 08/13/23 0428 08/14/23 0449  AST 44* 47* 38  ALT 32 34 29  ALKPHOS 29* 34* 36*  BILITOT 0.6 0.5 0.6  PROT 5.4* 5.8* 6.2*  ALBUMIN 2.4* 2.4* 2.6*    CBG: Recent Labs  Lab 08/15/23 0757 08/15/23 1114 08/15/23 1542  GLUCAP 94 123* 172*    Microbiology Studies:   Recent Results (from the past 240 hours)  Resp panel by RT-PCR (RSV, Flu A&B, Covid) Anterior Nasal Swab     Status: None   Collection Time: 08/06/23  4:04 PM   Specimen: Anterior Nasal Swab  Result Value Ref  Range Status   SARS Coronavirus 2 by RT PCR NEGATIVE NEGATIVE Final    Comment: (NOTE) SARS-CoV-2 target nucleic acids are NOT DETECTED.  The SARS-CoV-2 RNA is generally detectable in upper respiratory specimens during the acute phase of infection. The lowest concentration of SARS-CoV-2 viral copies this assay can detect is 138 copies/mL. A negative result does not preclude SARS-Cov-2 infection and should not be used as the sole basis for treatment or other patient management decisions. A negative result may occur with  improper specimen collection/handling, submission of specimen other than nasopharyngeal swab, presence of viral mutation(s) within  the areas targeted by this assay, and inadequate number of viral copies(<138 copies/mL). A negative result must be combined with clinical observations, patient history, and epidemiological information. The expected result is Negative.  Fact Sheet for Patients:  BloggerCourse.com  Fact Sheet for Healthcare Providers:  SeriousBroker.it  This test is no t yet approved or cleared by the [place]  FDA and  has been authorized for detection and/or diagnosis of SARS-CoV-2 by FDA under an Emergency Use Authorization (EUA). This EUA will remain  in effect (meaning this test can be used) for the duration of the COVID-19 declaration under Section 564(b)(1) of the Act, 21 U.S.C.section 360bbb-3(b)(1), unless the authorization is terminated  or revoked sooner.       Influenza A by PCR NEGATIVE NEGATIVE Final   Influenza B by PCR NEGATIVE NEGATIVE Final    Comment: (NOTE) The Xpert Xpress SARS-CoV-2/FLU/RSV plus assay is intended as an aid in the diagnosis of influenza from Nasopharyngeal swab specimens and should not be used as a sole basis for treatment. Nasal washings and aspirates are unacceptable for Xpert Xpress SARS-CoV-2/FLU/RSV testing.  Fact Sheet for Patients: BloggerCourse.com  Fact Sheet for Healthcare Providers: SeriousBroker.it  This test is not yet approved or cleared by the [place]  FDA and has been authorized for detection and/or diagnosis of SARS-CoV-2 by FDA under an Emergency Use Authorization (EUA). This EUA will remain in effect (meaning this test can be used) for the duration of the COVID-19 declaration under Section 564(b)(1) of the Act, 21 U.S.C. section 360bbb-3(b)(1), unless the authorization is terminated or revoked.     Resp Syncytial Virus by PCR NEGATIVE NEGATIVE Final    Comment: (NOTE) Fact Sheet for  Patients: BloggerCourse.com  Fact Sheet for Healthcare Providers: SeriousBroker.it  This test is not yet approved or cleared by the [place]  FDA and has been authorized for detection and/or diagnosis of SARS-CoV-2 by FDA under an Emergency Use Authorization (EUA). This EUA will remain in effect (meaning this test can be used) for the duration of the COVID-19 declaration under Section 564(b)(1) of the Act, 21 U.S.C. section 360bbb-3(b)(1), unless the authorization is terminated or revoked.  Performed at [facility], [address], [place], [place] [address]   Blood Culture (routine x 2)     Status: Abnormal   Collection Time: 08/06/23  6:41 PM   Specimen: Site Not Specified; Blood  Result Value Ref Range Status   Specimen Description   Final    SITE NOT SPECIFIED Performed at [facility], [address], [place], [place] [address]    Special Requests   Final    BOTTLES DRAWN AEROBIC ONLY Blood Culture results may not be optimal due to an inadequate volume of blood received in culture bottles Performed at [facility], [address], [place], [place] [address]    Culture  Setup Time   Final  GRAM POSITIVE COCCI IN CHAINS AEROBIC BOTTLE ONLY CRITICAL RESULT CALLED TO, READ BACK BY AND VERIFIED WITH: PHARMD TERRY GREEN ON 08/07/23 @ 1227 BY DRT Performed at [facility], [address], [place], [place] [address]    Culture ENTEROCOCCUS FAECALIS (A)  Final   Report Status 08/09/2023 FINAL  Final   Organism ID, Bacteria ENTEROCOCCUS FAECALIS  Final      Susceptibility   Enterococcus faecalis - MIC*    AMPICILLIN  <=2 SENSITIVE Sensitive     VANCOMYCIN  1 SENSITIVE Sensitive     GENTAMICIN  SYNERGY SENSITIVE Sensitive     * ENTEROCOCCUS FAECALIS  Blood Culture ID Panel (Reflexed)     Status: Abnormal   Collection Time: 08/06/23  6:41 PM  Result Value Ref Range  Status   Enterococcus faecalis DETECTED (A) NOT DETECTED Final    Comment: CRITICAL RESULT CALLED TO, READ BACK BY AND VERIFIED WITH: PHARMD TERRY GREEN ON 08/07/23 @ 1231 BY DRT    Enterococcus Faecium NOT DETECTED NOT DETECTED Final   Listeria monocytogenes NOT DETECTED NOT DETECTED Final   Staphylococcus species NOT DETECTED NOT DETECTED Final   Staphylococcus aureus (BCID) NOT DETECTED NOT DETECTED Final   Staphylococcus epidermidis NOT DETECTED NOT DETECTED Final   Staphylococcus lugdunensis NOT DETECTED NOT DETECTED Final   Streptococcus species NOT DETECTED NOT DETECTED Final   Streptococcus agalactiae NOT DETECTED NOT DETECTED Final   Streptococcus pneumoniae NOT DETECTED NOT DETECTED Final   Streptococcus pyogenes NOT DETECTED NOT DETECTED Final   A.calcoaceticus-baumannii NOT DETECTED NOT DETECTED Final   Bacteroides fragilis NOT DETECTED NOT DETECTED Final   Enterobacterales NOT DETECTED NOT DETECTED Final   Enterobacter cloacae complex NOT DETECTED NOT DETECTED Final   Escherichia coli NOT DETECTED NOT DETECTED Final   Klebsiella aerogenes NOT DETECTED NOT DETECTED Final   Klebsiella oxytoca NOT DETECTED NOT DETECTED Final   Klebsiella pneumoniae NOT DETECTED NOT DETECTED Final   Proteus species NOT DETECTED NOT DETECTED Final   Salmonella species NOT DETECTED NOT DETECTED Final   Serratia marcescens NOT DETECTED NOT DETECTED Final   Haemophilus influenzae NOT DETECTED NOT DETECTED Final   Neisseria meningitidis NOT DETECTED NOT DETECTED Final   Pseudomonas aeruginosa NOT DETECTED NOT DETECTED Final   Stenotrophomonas maltophilia NOT DETECTED NOT DETECTED Final   Candida albicans NOT DETECTED NOT DETECTED Final   Candida auris NOT DETECTED NOT DETECTED Final   Candida glabrata NOT DETECTED NOT DETECTED Final   Candida krusei NOT DETECTED NOT DETECTED Final   Candida parapsilosis NOT DETECTED NOT DETECTED Final   Candida tropicalis NOT DETECTED NOT DETECTED Final    Cryptococcus neoformans/gattii NOT DETECTED NOT DETECTED Final   Vancomycin  resistance NOT DETECTED NOT DETECTED Final    Comment: Performed at [facility], [address] [place], [place] [address]  Respiratory (~20 pathogens) panel by PCR     Status: None   Collection Time: 08/06/23  8:31 PM   Specimen: Nasal Mucosa; Respiratory  Result Value Ref Range Status   Adenovirus NOT DETECTED NOT DETECTED Final   Coronavirus 229E NOT DETECTED NOT DETECTED Final    Comment: (NOTE) The Coronavirus on the Respiratory Panel, DOES NOT test for the novel  Coronavirus (2019 nCoV)    Coronavirus HKU1 NOT DETECTED NOT DETECTED Final   Coronavirus NL63 NOT DETECTED NOT DETECTED Final   Coronavirus OC43 NOT DETECTED NOT DETECTED Final   Metapneumovirus NOT DETECTED NOT DETECTED Final   Rhinovirus / Enterovirus NOT DETECTED NOT DETECTED Final  Influenza A NOT DETECTED NOT DETECTED Final   Influenza B NOT DETECTED NOT DETECTED Final   Parainfluenza Virus 1 NOT DETECTED NOT DETECTED Final   Parainfluenza Virus 2 NOT DETECTED NOT DETECTED Final   Parainfluenza Virus 3 NOT DETECTED NOT DETECTED Final   Parainfluenza Virus 4 NOT DETECTED NOT DETECTED Final   Respiratory Syncytial Virus NOT DETECTED NOT DETECTED Final   Bordetella pertussis NOT DETECTED NOT DETECTED Final   Bordetella Parapertussis NOT DETECTED NOT DETECTED Final   Chlamydophila pneumoniae NOT DETECTED NOT DETECTED Final   Mycoplasma pneumoniae NOT DETECTED NOT DETECTED Final    Comment: Performed at [facility], [address], [place], [place] [address]  MRSA Next Gen by PCR, Nasal     Status: None   Collection Time: 08/06/23  8:31 PM   Specimen: Nasal Mucosa; Nasal Swab  Result Value Ref Range Status   MRSA by PCR Next Gen NOT DETECTED NOT DETECTED Final    Comment: (NOTE) The GeneXpert MRSA Assay (FDA approved for NASAL specimens only), is one component of a comprehensive MRSA colonization surveillance program. It  is not intended to diagnose MRSA infection nor to guide or monitor treatment for MRSA infections. Test performance is not FDA approved in patients less than 53 years old. Performed at [facility], [address] [place], [place] [address]   Blood Culture (routine x 2)     Status: Abnormal   Collection Time: 08/06/23  8:43 PM   Specimen: BLOOD RIGHT HAND  Result Value Ref Range Status   Specimen Description   Final    BLOOD RIGHT HAND Performed at [facility], [address] [place], [place] [address]    Special Requests   Final    BOTTLES DRAWN AEROBIC AND ANAEROBIC Blood Culture results may not be optimal due to an inadequate volume of blood received in culture bottles Performed at [facility], [address] [place], [place] [address]    Culture  Setup Time   Final    GRAM POSITIVE COCCI IN CHAINS ANAEROBIC BOTTLE ONLY CRITICAL VALUE NOTED.  VALUE IS CONSISTENT WITH PREVIOUSLY REPORTED AND CALLED VALUE. GRAM POSITIVE COCCI IN CLUSTERS AEROBIC BOTTLE ONLY Organism ID to follow    Culture (A)  Final    ENTEROCOCCUS FAECALIS SUSCEPTIBILITIES PERFORMED ON PREVIOUS CULTURE WITHIN THE LAST 5 DAYS. STAPHYLOCOCCUS EPIDERMIDIS THE SIGNIFICANCE OF ISOLATING THIS ORGANISM FROM A SINGLE SET OF BLOOD CULTURES WHEN MULTIPLE SETS ARE DRAWN IS UNCERTAIN. PLEASE NOTIFY THE MICROBIOLOGY DEPARTMENT WITHIN ONE WEEK IF SPECIATION AND SENSITIVITIES ARE REQUIRED. Performed at [facility], [address] [place], [place] [address]    Report Status 08/09/2023 FINAL  Final  Culture, blood (Routine X 2) w Reflex to ID Panel     Status: None   Collection Time: 08/08/23  1:11 PM   Specimen: BLOOD RIGHT HAND  Result Value Ref Range Status   Specimen Description BLOOD RIGHT HAND  Final   Special Requests   Final    BOTTLES DRAWN AEROBIC ONLY Blood Culture adequate volume   Culture   Final    NO GROWTH 5 DAYS Performed at [facility],  [address], [place], [place] [address]    Report Status 08/13/2023 FINAL  Final  Culture, blood (Routine X 2) w Reflex to ID Panel     Status: None   Collection Time: 08/08/23  1:12 PM   Specimen: BLOOD RIGHT HAND  Result Value  Ref Range Status   Specimen Description BLOOD RIGHT HAND  Final   Special Requests   Final    BOTTLES DRAWN AEROBIC ONLY Blood Culture adequate volume   Culture   Final    NO GROWTH 5 DAYS Performed at [facility], [address] [place], [place] [address]    Report Status 08/13/2023 FINAL  Final    Radiology Studies:  US  EKG SITE RITE Result Date: 08/14/2023 If Site Rite image not attached, placement could not be confirmed due to current cardiac rhythm.   Scheduled Meds:    acidophilus  1 capsule Oral TID   atorvastatin   80 mg Oral QPM   budesonide -glycopyrrolate -formoterol   2 puff Inhalation BID   Chlorhexidine  Gluconate Cloth  6 each Topical Daily   cholecalciferol   1,000 Units Oral QHS   clopidogrel   75 mg Oral Daily   empagliflozin   10 mg Oral Daily   fluticasone   2 spray Each Nare Daily   folic acid   1 mg Oral Daily   gabapentin   300 mg Oral QHS   guaiFENesin   600 mg Oral BID   insulin  aspart  0-5 Units Subcutaneous QHS   insulin  aspart  0-9 Units Subcutaneous TID WC   isosorbide -hydrALAZINE   1 tablet Oral BID   nystatin  cream   Topical TID   pantoprazole   40 mg Oral Daily   [START ON 08/16/2023] predniSONE   10 mg Oral Q breakfast   rivaroxaban   15 mg Oral Q supper   sacubitril -valsartan   1 tablet Oral BID   sodium chloride  flush  10-40 mL Intracatheter Q12H    Continuous Infusions:    ampicillin  (OMNIPEN) IV 2 g (08/15/23 1229)   cefTRIAXone  (ROCEPHIN )  IV 2 g (08/15/23 1100)     LOS: 8 days     Trenda Mar, MD,  FACP, [facility], [facility], [facility]   [facility] [place]      To contact the attending provider between 7A-7P or the covering provider during after hours 7P-7A, please log into  the web site [email or web address] and access using universal [place] password for that web site. If you do not have the password, please call the hospital operator.  08/15/2023, 4:56 PM

## 2023-08-16 DIAGNOSIS — N179 Acute kidney failure, unspecified: Secondary | ICD-10-CM | POA: Diagnosis not present

## 2023-08-16 DIAGNOSIS — E876 Hypokalemia: Secondary | ICD-10-CM

## 2023-08-16 DIAGNOSIS — I5022 Chronic systolic (congestive) heart failure: Secondary | ICD-10-CM | POA: Diagnosis not present

## 2023-08-16 DIAGNOSIS — I959 Hypotension, unspecified: Secondary | ICD-10-CM

## 2023-08-16 DIAGNOSIS — N189 Chronic kidney disease, unspecified: Secondary | ICD-10-CM

## 2023-08-16 LAB — GLUCOSE, CAPILLARY
Glucose-Capillary: 142 mg/dL — ABNORMAL HIGH (ref 70–99)
Glucose-Capillary: 153 mg/dL — ABNORMAL HIGH (ref 70–99)
Glucose-Capillary: 190 mg/dL — ABNORMAL HIGH (ref 70–99)
Glucose-Capillary: 138 mg/dL — ABNORMAL HIGH (ref 70–99)

## 2023-08-16 LAB — BASIC METABOLIC PANEL WITH GFR
Anion gap: 9 (ref 5–15)
BUN: 23 mg/dL (ref 8–23)
CO2: 26 mmol/L (ref 22–32)
Calcium: 8.6 mg/dL — ABNORMAL LOW (ref 8.9–10.3)
Chloride: 103 mmol/L (ref 98–111)
Creatinine, Ser: 1.94 mg/dL — ABNORMAL HIGH (ref 0.44–1.00)
GFR, Estimated: 27 mL/min — ABNORMAL LOW (ref >60)
Glucose, Bld: 86 mg/dL (ref 70–99)
Potassium: 3.4 mmol/L — ABNORMAL LOW (ref 3.5–5.1)
Sodium: 138 mmol/L (ref 135–145)

## 2023-08-16 MED ORDER — SACUBITRIL-VALSARTAN 24-26 MG PO TABS: 1.0000 | ORAL_TABLET | Freq: Two times a day (BID) | ORAL | Status: DC

## 2023-08-16 MED ORDER — RIVAROXABAN 20 MG PO TABS
20.0000 mg | ORAL_TABLET | Freq: Every day | ORAL | Status: AC
  Administered 2023-08-16 – 2023-08-18 (×3): 20 mg via ORAL
  Filled 2023-08-16 (×3): qty 1

## 2023-08-16 MED ORDER — PREDNISONE 5 MG PO TABS
7.5000 mg | ORAL_TABLET | Freq: Every day | ORAL | Status: DC
  Administered 2023-08-17 – 2023-08-19 (×3): 7.5 mg via ORAL
  Filled 2023-08-16 (×3): qty 2

## 2023-08-16 MED ORDER — RIVAROXABAN 20 MG PO TABS: 20.0000 mg | ORAL_TABLET | Freq: Every day | ORAL | Status: DC

## 2023-08-16 MED ORDER — POTASSIUM CHLORIDE CRYS ER 20 MEQ PO TBCR
40.0000 meq | EXTENDED_RELEASE_TABLET | Freq: Once | ORAL | Status: AC
  Administered 2023-08-16: 40 meq via ORAL
  Filled 2023-08-16: qty 2

## 2023-08-16 MED ORDER — SODIUM CHLORIDE 0.9 % IV SOLN
2.0000 g | Freq: Three times a day (TID) | INTRAVENOUS | Status: DC
  Administered 2023-08-16 – 2023-08-22 (×17): 2 g via INTRAVENOUS
  Filled 2023-08-16 (×20): qty 2000

## 2023-08-16 NOTE — Progress Notes (Addendum)
 Progress Note  Patient Name: Ebony Scott Date of Encounter: 08/16/2023 [facility] Cardiologist: Ebony Bruckner, MD   Interval Summary   No complaints.  No chest pain or shortness of breath.  Does not feel dizzy, had some low systolic's yesterday in the 80s to 90s.  Vital Signs Vitals:   08/15/23 2103 08/15/23 2324 08/16/23 0353 08/16/23 0806  BP: 119/67 (!) 109/59 (!) 109/59 109/61  Pulse: 75 75 61 76  Resp: 18 20 15 16   Temp: 99.6 F (37.6 C) 98.8 F (37.1 C) 98.8 F (37.1 C) 98.5 F (36.9 C)  TempSrc: Oral Oral Oral Oral  SpO2: 97% 100% 97%   Weight:      Height:        Intake/Output Summary (Last 24 hours) at 08/16/2023 0847 Last data filed at 08/16/2023 0805 Gross per 24 hour  Intake 240 ml  Output 150 ml  Net 90 ml      08/09/2023   10:54 PM 08/07/2023   11:00 PM 08/02/2023    2:12 PM  Last 3 Weights  Weight (lbs) 122 lb 5.7 oz 130 lb 1.1 oz 130 lb  Weight (kg) 55.5 kg 59 kg 58.968 kg      Telemetry/ECG  NSR 70s - Personally Reviewed  Physical Exam  GEN: No acute distress.   Neck: No JVD Cardiac: RRR, no murmurs, rubs, or gallops.  Respiratory: Clear to auscultation bilaterally. GI: Soft, nontender, non-distended  MS: No edema  Patient Profile Patient with past medical history significant for ischemic cardiomyopathy with reduced EF status post ICD implant 2024, CAD status post DES to LAD, RA, chronic respiratory failure, CKD, DVT/PE, anemia, history of tracheobronchomalacia, hypogammaglobulinemia.  Patient admitted for bacteremia secondary to recurrent pulmonary infections and chronic immunosuppression.  TEE showing no vegetation.  EP has plans for device extraction.  Cardiology asked to reround to go reconcile GDMT.  Assessment & Plan   Chronic HFrEF/ICM Echocardiogram this admission with EF 25 to 30% with normal RV.  Overall seems to have decently compensated CHF.  Looks euvolemic and has no symptoms.  She has been hypotensive here  but was getting daily Lasix  when she was on twice weekly dosing outpatient. Home GDMT:Jardiance  10 mg daily, PO Lasix  40 mg twice weekly/PRN, BiDil  20-36.5 mg BID, Entresto  24-26 mg BID.  She was previously tolerating this regimen without issues PTA Currently on Jardiance  10 mg, BiDil  20-37.5 mg twice daily.  Not sure what is going on with her Entresto .  This was held for today but resumed in the following days?  Primary team adjusting this.  I would be in preference of holding her Bidil  over entresto . But does have mild AKI, will discuss with MD intended regiment.  Hold lasix  for now.   ICD with plans for extraction Planned for 7/2.   Per EP hold Xarelto  15 mg on 6/30, 7/1 and 7/2.  However I see they are also on Plavix  likely for previous history of stent placement.  This was performed in November 2022, not sure this is still indicated now.  Will review with MD.  CAD status post DES to LAD Last intervention was November 2022.  LAD disease felt to be culprit of reduced EF.  Minimal CAD in the right system. See above about antiplatelet therapy. Continue with atorvastatin  80 mg.  LDL 11 months ago 39.  Well-controlled goal is less than 55.  AKI on CKD Output baseline.  Previously around 1.3-1.5.  Creatinine in the low 2s now.  History  of PE/DVT On Xarelto  50 mg.    For questions or updates, please contact [facility] Please consult [email or web address] for contact info under       Signed, Ebony LITTIE Sluder, PA-C     History and all data above reviewed.  Patient examined.  I agree with the findings as above.  No chest pain.  No SOB.  Weak.  The patient exam reveals COR:RRR  ,  Lungs: Decreased breath sounds with coarse crackles  ,  Abd: Positive bowel sounds, no rebound no guarding, Ext No edema   .  All available labs, radiology testing, previous records reviewed. Agree with documented assessment and plan. CAD:  I am going to stop her Plavix .   I think she can be on DOAC alone.  HF:  BP  running low but improved today.  I will stop the BiDil  and continue Entresto  and other meds as listed.    Ebony Scott  12:13 PM  08/16/2023

## 2023-08-16 NOTE — Progress Notes (Signed)
 PROGRESS NOTE   Ebony Scott  FMW:[id number]    DOB: 08-02-1950    DOA: 08/06/2023  PCP: [name], MD   I have briefly reviewed patients previous medical records in [facility].   Brief Hospital Course:   73 y.o. 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  She is admitted for CIED associated E faecalis bacteremia, community acquired pneumonia, acute respiratory failure and AKI.  ID following.  EP plan extraction of ICD on 7/2.   Assessment & Plan:   CIED associated Enterococcus faecalis bacteremia ID consultation and follow-up appreciated. Continuing IV ampicillin  and ceftriaxone  per OPAT protocol EP follow-up appreciated and plan ICD extraction for 7/2.  As per communication with EP team, patient could be discharged for outpatient follow-up for this procedure. 6/17: 1 of 2 sets of blood cultures positive for Enterococcus faecalis, sensitive to ampicillin , vancomycin  and gentamicin  synergy Surveillance blood cultures x 2 from 6/19: Negative, final report. TEE 6/23: No thrombus.  No vegetation noted on RV lead.  No valvular vegetations.  Sepsis due to community-acquired pneumonia/recurrent pneumonitis Completed IV antibiotic course for same. Patient reportedly has history of immunosuppression and recurrent pneumonias. Pulmonology evaluated.  Acute kidney injury on stage IIIb chronic kidney disease Baseline creatinine reportedly 1.3-1.5. Had presented with creatinine of 2.14, creatinine had improved but back up from  1.53 > 2.1 on 6/26, slightly better to 1.94 today. Worsening creatinine likely related to hypotension on 6/26, ATN in meds.  Lasix  discontinued by cardiology yesterday.  I intentionally held Entresto  dose today to avoid worsening and this was discussed with the Cardiology APP this morning.  Cardiology today have discontinued BiDil . Follow BMP in AM.  Acute on chronic respiratory failure with hypoxia/bronchiectasis At baseline patient reportedly uses [place] oxygen  2 to 3 L/min at night. Currently weaned off to room air.  Acute respiratory failure has resolved.  Ischemic cardiomyopathy/chronic HFrEF/s/p ICD Clinically euvolemic. As per EP Cardiology, she will need to continue on Xarelto  through 6/29 and no doses after in anticipation of extraction.  They recommend holding Xarelto  on 6/30, 7/1 and 7/2.  See EP Cardiology note from 6/26 for details.  CAD s/p DES to LAD Cardiology following.  Continue atorvastatin .  Plavix  discontinued by cardiology 6/27.  Hypotension Hypotensive on 6/25 and 6/26-was down to the 60s on brief standing with PT. May be due to multiple meds i.e. Lasix  (was taking twice weekly at home and here was on 40 Mg daily for several days), BiDil , Entresto .  Added holding parameters for evening doses Improved after IV fluid bolus to 50 mL x 1.  Since then Cardiology have discontinued Lasix  and BiDil . Blood pressures better but still soft with SBP in the 100s.  History of PE/DVT Management of Xarelto  as noted above  Hypogammaglobulinemia On IVIG as outpatient.  Seropositive rheumatoid arthritis/chronic steroid use Back on prednisone  5 Mg daily after stress dose steroids early on in admission Has a scheduled follow-up with rheumatology on 6/25 but since hospitalized, this will need to be rescheduled Confirmed with patient's daughter that patient is on maintenance prednisone  of 5 Mg daily.  Given possibility of adrenal insufficiency, will give additional dose of 5 mg prednisone   today and increased prednisone  to 10 mg daily briefly until blood pressure stabilizes.  Will start tapering from tomorrow to 7.5 Mg for 2 days and then back to her 5 Mg daily.  Type II DM, controlled A1c 6.5 on 6/18 CBGs well-controlled, discontinued SSI.  Chronic pain syndrome Continue gabapentin   Hypokalemia Replace and follow  Generalized weakness/physical deconditioning Presented initially recommended home health PT and OT.  However she has since declined and per PT recommendation on 6/26, SNF is recommended.  TOC on board.  Body mass index is 22.38 kg/m.   DVT prophylaxis:      Code Status: Limited: Do not attempt resuscitation (DNR) -DNR-LIMITED -Do Not Intubate/DNI :  Family Communication: Did speak with the patient's daughter via phone on 6/26 around 7 PM.  Updated care and answered all questions. Disposition:  Status is: Inpatient Remains inpatient appropriate because: Agement of hypotension as noted above.  Ongoing IV antibiotics.  SNF at discharge.     Consultants:   EP Cardiology Infectious disease Critical care  Procedures:   As noted above  Subjective:  Still feeling weak but denies dizziness, lightheadedness, chest pain or dyspnea.  Objective:   Vitals:   08/15/23 2324 08/16/23 0353 08/16/23 0806 08/16/23 1209  BP: (!) 109/59 (!) 109/59 109/61 (!) 106/45  Pulse: 75 61 76 71  Resp: 20 15 16 15   Temp: 98.8 F (37.1 C) 98.8 F (37.1 C) 98.5 F (36.9 C) 97.9 F (36.6 C)  TempSrc: Oral Oral Oral Oral  SpO2: 100% 97%    Weight:      Height:        General exam: Elderly female, moderately built and nourished, chronically ill looking, sitting up in reclining chair.  Looks better than she did yesterday. Respiratory system: Clear to auscultation. Respiratory effort normal. Cardiovascular system: S1 & S2 heard, RRR. No JVD, murmurs, rubs, gallops or clicks. No pedal edema.  Telemetry personally reviewed: Sinus rhythm. Gastrointestinal system: Abdomen is  nondistended, soft and nontender. No organomegaly or masses felt. Normal bowel sounds heard. Central nervous system: Alert and oriented. No focal neurological deficits. Extremities: Symmetric 5 x 5 power. Skin: No rashes, lesions or ulcers Psychiatry: Judgement and insight appear normal. Mood & affect appropriate.     Data Reviewed:   I have personally reviewed following labs and imaging studies   CBC: Recent Labs  Lab 08/12/23 0657 08/15/23 1836  WBC 11.5* 12.4*  NEUTROABS 8.0*  --   HGB 9.0* 9.8*  HCT 28.6* 30.4*  MCV 102.1* 99.0  PLT 149* 179    Basic Metabolic Panel: Recent Labs  Lab 08/12/23 0657 08/13/23 0428 08/14/23 0449 08/15/23 1836 08/16/23 0743  NA 140 137 137 137 138  K 3.7 3.5 3.6 3.8 3.4*  CL 111 104 103 99 103  CO2 20* 24 25 23 26   GLUCOSE 82 88 87 240* 86  BUN 17 19 19 23 23   CREATININE 1.36* 1.59* 1.53* 2.10* 1.94*  CALCIUM  8.1* 8.6* 8.7* 8.8* 8.6*    Liver Function Tests: Recent Labs  Lab 08/12/23 0657 08/13/23 0428 08/14/23 0449  AST 44* 47* 38  ALT 32 34 29  ALKPHOS 29* 34* 36*  BILITOT 0.6 0.5 0.6  PROT 5.4* 5.8* 6.2*  ALBUMIN 2.4* 2.4* 2.6*    CBG: Recent Labs  Lab 08/15/23 2104 08/16/23 0814 08/16/23 1146  GLUCAP 188* 142* 153*    Microbiology Studies:   Recent Results (  from the past 240 hours)  Resp panel by RT-PCR (RSV, Flu A&B, Covid) Anterior Nasal Swab     Status: None   Collection Time: 08/06/23  4:04 PM   Specimen: Anterior Nasal Swab  Result Value Ref Range Status   SARS Coronavirus 2 by RT PCR NEGATIVE NEGATIVE Final    Comment: (NOTE) SARS-CoV-2 target nucleic acids are NOT DETECTED.  The SARS-CoV-2 RNA is generally detectable in upper respiratory specimens during the acute phase of infection. The lowest concentration of SARS-CoV-2 viral copies this assay can detect is 138 copies/mL. A negative result does not preclude SARS-Cov-2 infection and should not be used as the sole basis for treatment or other  patient management decisions. A negative result may occur with  improper specimen collection/handling, submission of specimen other than nasopharyngeal swab, presence of viral mutation(s) within the areas targeted by this assay, and inadequate number of viral copies(<138 copies/mL). A negative result must be combined with clinical observations, patient history, and epidemiological information. The expected result is Negative.  Fact Sheet for Patients:  BloggerCourse.com  Fact Sheet for Healthcare Providers:  SeriousBroker.it  This test is no t yet approved or cleared by the [place]  FDA and  has been authorized for detection and/or diagnosis of SARS-CoV-2 by FDA under an Emergency Use Authorization (EUA). This EUA will remain  in effect (meaning this test can be used) for the duration of the COVID-19 declaration under Section 564(b)(1) of the Act, 21 U.S.C.section 360bbb-3(b)(1), unless the authorization is terminated  or revoked sooner.       Influenza A by PCR NEGATIVE NEGATIVE Final   Influenza B by PCR NEGATIVE NEGATIVE Final    Comment: (NOTE) The Xpert Xpress SARS-CoV-2/FLU/RSV plus assay is intended as an aid in the diagnosis of influenza from Nasopharyngeal swab specimens and should not be used as a sole basis for treatment. Nasal washings and aspirates are unacceptable for Xpert Xpress SARS-CoV-2/FLU/RSV testing.  Fact Sheet for Patients: BloggerCourse.com  Fact Sheet for Healthcare Providers: SeriousBroker.it  This test is not yet approved or cleared by the [place]  FDA and has been authorized for detection and/or diagnosis of SARS-CoV-2 by FDA under an Emergency Use Authorization (EUA). This EUA will remain in effect (meaning this test can be used) for the duration of the COVID-19 declaration under Section 564(b)(1) of the Act, 21 U.S.C. section  360bbb-3(b)(1), unless the authorization is terminated or revoked.     Resp Syncytial Virus by PCR NEGATIVE NEGATIVE Final    Comment: (NOTE) Fact Sheet for Patients: BloggerCourse.com  Fact Sheet for Healthcare Providers: SeriousBroker.it  This test is not yet approved or cleared by the [place]  FDA and has been authorized for detection and/or diagnosis of SARS-CoV-2 by FDA under an Emergency Use Authorization (EUA). This EUA will remain in effect (meaning this test can be used) for the duration of the COVID-19 declaration under Section 564(b)(1) of the Act, 21 U.S.C. section 360bbb-3(b)(1), unless the authorization is terminated or revoked.  Performed at [facility], [address] [place], [place] [address]   Blood Culture (routine x 2)     Status: Abnormal   Collection Time: 08/06/23  6:41 PM   Specimen: Site Not Specified; Blood  Result Value Ref Range Status   Specimen Description   Final    SITE NOT SPECIFIED Performed at [facility], [address], [place], [place] [address]    Special Requests   Final    BOTTLES DRAWN AEROBIC ONLY  Blood Culture results may not be optimal due to an inadequate volume of blood received in culture bottles Performed at [facility], [address] [place], [place] [address]    Culture  Setup Time   Final    GRAM POSITIVE COCCI IN CHAINS AEROBIC BOTTLE ONLY CRITICAL RESULT CALLED TO, READ BACK BY AND VERIFIED WITH: PHARMD TERRY GREEN ON 08/07/23 @ 1227 BY DRT Performed at [facility], [address] [place], [place] [address]    Culture ENTEROCOCCUS FAECALIS (A)  Final   Report Status 08/09/2023 FINAL  Final   Organism ID, Bacteria ENTEROCOCCUS FAECALIS  Final      Susceptibility   Enterococcus faecalis - MIC*    AMPICILLIN  <=2 SENSITIVE Sensitive     VANCOMYCIN  1 SENSITIVE Sensitive     GENTAMICIN  SYNERGY  SENSITIVE Sensitive     * ENTEROCOCCUS FAECALIS  Blood Culture ID Panel (Reflexed)     Status: Abnormal   Collection Time: 08/06/23  6:41 PM  Result Value Ref Range Status   Enterococcus faecalis DETECTED (A) NOT DETECTED Final    Comment: CRITICAL RESULT CALLED TO, READ BACK BY AND VERIFIED WITH: PHARMD TERRY GREEN ON 08/07/23 @ 1231 BY DRT    Enterococcus Faecium NOT DETECTED NOT DETECTED Final   Listeria monocytogenes NOT DETECTED NOT DETECTED Final   Staphylococcus species NOT DETECTED NOT DETECTED Final   Staphylococcus aureus (BCID) NOT DETECTED NOT DETECTED Final   Staphylococcus epidermidis NOT DETECTED NOT DETECTED Final   Staphylococcus lugdunensis NOT DETECTED NOT DETECTED Final   Streptococcus species NOT DETECTED NOT DETECTED Final   Streptococcus agalactiae NOT DETECTED NOT DETECTED Final   Streptococcus pneumoniae NOT DETECTED NOT DETECTED Final   Streptococcus pyogenes NOT DETECTED NOT DETECTED Final   A.calcoaceticus-baumannii NOT DETECTED NOT DETECTED Final   Bacteroides fragilis NOT DETECTED NOT DETECTED Final   Enterobacterales NOT DETECTED NOT DETECTED Final   Enterobacter cloacae complex NOT DETECTED NOT DETECTED Final   Escherichia coli NOT DETECTED NOT DETECTED Final   Klebsiella aerogenes NOT DETECTED NOT DETECTED Final   Klebsiella oxytoca NOT DETECTED NOT DETECTED Final   Klebsiella pneumoniae NOT DETECTED NOT DETECTED Final   Proteus species NOT DETECTED NOT DETECTED Final   Salmonella species NOT DETECTED NOT DETECTED Final   Serratia marcescens NOT DETECTED NOT DETECTED Final   Haemophilus influenzae NOT DETECTED NOT DETECTED Final   Neisseria meningitidis NOT DETECTED NOT DETECTED Final   Pseudomonas aeruginosa NOT DETECTED NOT DETECTED Final   Stenotrophomonas maltophilia NOT DETECTED NOT DETECTED Final   Candida albicans NOT DETECTED NOT DETECTED Final   Candida auris NOT DETECTED NOT DETECTED Final   Candida glabrata NOT DETECTED NOT DETECTED Final    Candida krusei NOT DETECTED NOT DETECTED Final   Candida parapsilosis NOT DETECTED NOT DETECTED Final   Candida tropicalis NOT DETECTED NOT DETECTED Final   Cryptococcus neoformans/gattii NOT DETECTED NOT DETECTED Final   Vancomycin  resistance NOT DETECTED NOT DETECTED Final    Comment: Performed at [facility], [address], [place], [place] [address]  Respiratory (~20 pathogens) panel by PCR     Status: None   Collection Time: 08/06/23  8:31 PM   Specimen: Nasal Mucosa; Respiratory  Result Value Ref Range Status   Adenovirus NOT DETECTED NOT DETECTED Final   Coronavirus 229E NOT DETECTED NOT DETECTED Final    Comment: (NOTE) The Coronavirus on the Respiratory Panel, DOES NOT test for the novel  Coronavirus (2019 nCoV)  Coronavirus HKU1 NOT DETECTED NOT DETECTED Final   Coronavirus NL63 NOT DETECTED NOT DETECTED Final   Coronavirus OC43 NOT DETECTED NOT DETECTED Final   Metapneumovirus NOT DETECTED NOT DETECTED Final   Rhinovirus / Enterovirus NOT DETECTED NOT DETECTED Final   Influenza A NOT DETECTED NOT DETECTED Final   Influenza B NOT DETECTED NOT DETECTED Final   Parainfluenza Virus 1 NOT DETECTED NOT DETECTED Final   Parainfluenza Virus 2 NOT DETECTED NOT DETECTED Final   Parainfluenza Virus 3 NOT DETECTED NOT DETECTED Final   Parainfluenza Virus 4 NOT DETECTED NOT DETECTED Final   Respiratory Syncytial Virus NOT DETECTED NOT DETECTED Final   Bordetella pertussis NOT DETECTED NOT DETECTED Final   Bordetella Parapertussis NOT DETECTED NOT DETECTED Final   Chlamydophila pneumoniae NOT DETECTED NOT DETECTED Final   Mycoplasma pneumoniae NOT DETECTED NOT DETECTED Final    Comment: Performed at [facility], [address], [place], [place] [address]  MRSA Next Gen by PCR, Nasal     Status: None   Collection Time: 08/06/23  8:31 PM   Specimen: Nasal Mucosa; Nasal Swab  Result Value Ref Range Status   MRSA by PCR Next Gen NOT DETECTED NOT DETECTED Final     Comment: (NOTE) The GeneXpert MRSA Assay (FDA approved for NASAL specimens only), is one component of a comprehensive MRSA colonization surveillance program. It is not intended to diagnose MRSA infection nor to guide or monitor treatment for MRSA infections. Test performance is not FDA approved in patients less than 19 years old. Performed at [facility], [address] [place], [place] [address]   Blood Culture (routine x 2)     Status: Abnormal   Collection Time: 08/06/23  8:43 PM   Specimen: BLOOD RIGHT HAND  Result Value Ref Range Status   Specimen Description   Final    BLOOD RIGHT HAND Performed at [facility], [address], [place], [place] [address]    Special Requests   Final    BOTTLES DRAWN AEROBIC AND ANAEROBIC Blood Culture results may not be optimal due to an inadequate volume of blood received in culture bottles Performed at [facility], [address] [place], [place] [address]    Culture  Setup Time   Final    GRAM POSITIVE COCCI IN CHAINS ANAEROBIC BOTTLE ONLY CRITICAL VALUE NOTED.  VALUE IS CONSISTENT WITH PREVIOUSLY REPORTED AND CALLED VALUE. GRAM POSITIVE COCCI IN CLUSTERS AEROBIC BOTTLE ONLY Organism ID to follow    Culture (A)  Final    ENTEROCOCCUS FAECALIS SUSCEPTIBILITIES PERFORMED ON PREVIOUS CULTURE WITHIN THE LAST 5 DAYS. STAPHYLOCOCCUS EPIDERMIDIS THE SIGNIFICANCE OF ISOLATING THIS ORGANISM FROM A SINGLE SET OF BLOOD CULTURES WHEN MULTIPLE SETS ARE DRAWN IS UNCERTAIN. PLEASE NOTIFY THE MICROBIOLOGY DEPARTMENT WITHIN ONE WEEK IF SPECIATION AND SENSITIVITIES ARE REQUIRED. Performed at [facility], [address], [place], [place] [address]    Report Status 08/09/2023 FINAL  Final  Culture, blood (Routine X 2) w Reflex to ID Panel     Status: None   Collection Time: 08/08/23  1:11 PM   Specimen: BLOOD RIGHT HAND  Result Value Ref Range Status   Specimen Description BLOOD RIGHT HAND  Final    Special Requests   Final    BOTTLES DRAWN AEROBIC ONLY Blood Culture adequate volume   Culture   Final    NO GROWTH 5 DAYS Performed at [facility], [address] [place],  [place] [address]    Report Status 08/13/2023 FINAL  Final  Culture, blood (Routine X 2) w Reflex to ID Panel     Status: None   Collection Time: 08/08/23  1:12 PM   Specimen: BLOOD RIGHT HAND  Result Value Ref Range Status   Specimen Description BLOOD RIGHT HAND  Final   Special Requests   Final    BOTTLES DRAWN AEROBIC ONLY Blood Culture adequate volume   Culture   Final    NO GROWTH 5 DAYS Performed at [facility], [address], [place], [place] [address]    Report Status 08/13/2023 FINAL  Final    Radiology Studies:  No results found.   Scheduled Meds:    acidophilus  1 capsule Oral TID   atorvastatin   80 mg Oral QPM   budesonide -glycopyrrolate -formoterol   2 puff Inhalation BID   Chlorhexidine  Gluconate Cloth  6 each Topical Daily   cholecalciferol   1,000 Units Oral QHS   empagliflozin   10 mg Oral Daily   fluticasone   2 spray Each Nare Daily   folic acid   1 mg Oral Daily   gabapentin   300 mg Oral QHS   guaiFENesin   600 mg Oral BID   insulin  aspart  0-5 Units Subcutaneous QHS   insulin  aspart  0-9 Units Subcutaneous TID WC   nystatin  cream   Topical TID   pantoprazole   40 mg Oral Daily   predniSONE   10 mg Oral Q breakfast   rivaroxaban   15 mg Oral Q supper   [START ON 08/17/2023] sacubitril -valsartan   1 tablet Oral BID   sodium chloride  flush  10-40 mL Intracatheter Q12H    Continuous Infusions:    ampicillin  (OMNIPEN) IV 2 g (08/16/23 1228)   cefTRIAXone  (ROCEPHIN )  IV 2 g (08/16/23 1041)     LOS: 9 days     Trenda Mar, MD,  FACP, [facility], [facility], [facility]   [facility] Advisor [place]      To contact the attending provider between 7A-7P or the covering provider during after hours 7P-7A, please log into the web site [email or web address] and  access using universal [place] password for that web site. If you do not have the password, please call the hospital operator.  08/16/2023, 1:20 PM

## 2023-08-16 NOTE — Progress Notes (Addendum)
 PHARMACY CONSULT NOTE FOR:  OUTPATIENT  PARENTERAL ANTIBIOTIC THERAPY (OPAT)  Indication: E faecalis bacteremia, presumed ICD infection Regimen: Ampicillin  6g daily as a continuous infusion + Rocephin  2g IV every 12 hours End date: 09/19/23 (6 weeks from 08/08/23)  IV antibiotic discharge orders are pended. To discharging provider:  please sign these orders via discharge navigator,  Select New Orders & click on the button choice - Manage This Unsigned Work.     Thank you for allowing pharmacy to be a part of this patient's care.  Almarie Lunger, PharmD, BCPS, BCIDP Infectious Diseases Clinical Pharmacist 08/16/2023 2:46 PM   **Pharmacist phone directory can now be found on [email or web address] ([email or web address]).  Listed under [facility].

## 2023-08-16 NOTE — TOC Progression Note (Signed)
 Transition of Care [facility]) - Progression Note    Patient Details  Name: Ebony Scott MRN: [id number] Date of Birth: 12/26/1950  Transition of Care [facility]) CM/SW Contact  Gwenn Frieze [place], [place] Phone Number: 08/16/2023, 2:43 PM  Clinical Narrative:   Met with pt and provided list of current SNF offers. Pt to discuss options with her dtr this evening and will update TOC staff. Pt will need auth started closer to EDD.   Frieze Gwenn, MSW, LCSW [phone] (coverage)      Expected Discharge Plan: Skilled Nursing Facility Barriers to Discharge: Continued Medical Work up  Expected Discharge Plan and Services In-house Referral: NA Discharge Planning Services: CM Consult Post Acute Care Choice: Home Health, Resumption of Svcs/PTA Provider Living arrangements for the past 2 months: Single Family Home                 DME Arranged: IV pump/equipment DME Agency:  [facility]) Date DME Agency Contacted: 08/14/23 Time DME Agency Contacted: 1154 Representative spoke with at DME Agency: Pam HH Arranged: RN, Disease Management, PT, OT, Nurse's Aide HH Agency: [facility] Date [facility] Agency Contacted: 08/14/23 Time HH Agency Contacted: 1159 Representative spoke with at [facility] Agency: Arna   Social Determinants of Health (SDOH) Interventions SDOH Screenings   Food Insecurity: No Food Insecurity (08/07/2023)  Housing: Low Risk  (08/07/2023)  Transportation Needs: No Transportation Needs (08/07/2023)  Utilities: Not At Risk (08/07/2023)  Alcohol  Screen: Low Risk  (11/22/2022)  Depression (PHQ2-9): Low Risk  (05/29/2023)  Financial Resource Strain: Low Risk  (03/26/2023)  Physical Activity: Inactive (03/26/2023)  Social Connections: Socially Isolated (08/07/2023)  Stress: Stress Concern Present (03/26/2023)  Tobacco Use: Low Risk  (08/12/2023)  Health Literacy: Adequate Health Literacy (11/22/2022)    Readmission Risk Interventions    08/14/2023   11:49 AM 05/20/2023   12:38 PM 12/06/2022     2:19 PM  Readmission Risk Prevention Plan  Transportation Screening Complete Complete Complete  PCP or Specialist Appt within 3-5 Days   Complete  HRI or Home Care Consult   Complete  Social Work Consult for Recovery Care Planning/Counseling   Complete  Palliative Care Screening   Complete  Medication Review Ocean[facility]) Referral to Pharmacy Complete Complete  PCP or Specialist appointment within 3-5 days of discharge  Complete   HRI or Home Care Consult Complete Complete   SW Recovery Care/Counseling Consult Complete Complete   Palliative Care Screening Not Applicable Not Applicable   Skilled Nursing Facility Not Applicable Complete

## 2023-08-16 NOTE — Progress Notes (Signed)
 Occupational Therapy Treatment Patient Details Name: Ebony Scott MRN: [id number] DOB: 06/30/1950 Today's Date: 08/16/2023   History of present illness 73 yo female admitted with Pna. Hx of RA-chronic prednision, HF, CAD, chronic resp failure-O2 at night, anemia, chronic pain, fibromyalgia, osteomyelitis, DVT, osteoporosis, breast Ca, CKD, DM   OT comments  Pt progressing well towards goals. Progressed to complete short distance mobility in the room with CGA. Pt orthostatic and symptomatic, limiting activity tolerance. D/t changes, updated recommendations for <3 hours of skilled rehab daily to optimize independence levels. Will continue to follow acutely.       If plan is discharge home, recommend the following:  A little help with walking and/or transfers;A little help with bathing/dressing/bathroom;Help with stairs or ramp for entrance   Equipment Recommendations  None recommended by OT    Recommendations for Other Services      Precautions / Restrictions Precautions Precautions: Fall Recall of Precautions/Restrictions: Intact Restrictions Weight Bearing Restrictions Per Provider Order: No       Mobility Bed Mobility Overal bed mobility: Needs Assistance Bed Mobility: Supine to Sit     Supine to sit: Contact guard     General bed mobility comments: increased time and effort    Transfers Overall transfer level: Needs assistance Equipment used: Rolling walker (2 wheels) Transfers: Sit to/from Stand, Bed to chair/wheelchair/BSC Sit to Stand: Contact guard assist     Step pivot transfers: Contact guard assist     General transfer comment: Pt CGA for balance and safety     Balance Overall balance assessment: Needs assistance Sitting-balance support: No upper extremity supported, Feet unsupported Sitting balance-Leahy Scale: Good     Standing balance support: Bilateral upper extremity supported, During functional activity, Reliant on assistive device for  balance Standing balance-Leahy Scale: Fair         ADL either performed or assessed with clinical judgement   ADL Overall ADL's : Needs assistance/impaired Eating/Feeding: Set up;Sitting       Toilet Transfer: Rolling walker (2 wheels);Ambulation;Contact guard assist Toilet Transfer Details (indicate cue type and reason): Simulated in room to CGA d/t balance         Functional mobility during ADLs: Contact guard assist;Rolling walker (2 wheels) General ADL Comments: Pt with soft bp limiting activity tolerance    Extremity/Trunk Assessment Upper Extremity Assessment Upper Extremity Assessment: RUE deficits/detail;LUE deficits/detail RUE Deficits / Details: RA deformites with OA crepedus noted with MMT. WFL ROM but severe weakness of 3-/5 to 3+/5. RA node on D5 which is painful. 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 WFL ROM but severe weakness of 3-/5 to 3+/5. LUE Coordination: decreased fine motor   Lower Extremity Assessment Lower Extremity Assessment: Defer to PT evaluation        Vision   Vision Assessment?: No apparent visual deficits         Communication Communication Communication: No apparent difficulties   Cognition Arousal: Alert Behavior During Therapy: WFL for tasks assessed/performed Cognition: No apparent impairments     Following commands: Intact        Cueing   Cueing Techniques: Verbal cues        General Comments Pt maintaining O2 levels on RA, but orthostatic and symptomatic, see additional note    Pertinent Vitals/ Pain       Pain Assessment Pain Assessment: Faces Faces Pain Scale: Hurts a little bit Pain Location: feet Pain Descriptors / Indicators: Discomfort, Aching, Constant Pain  Intervention(s): Limited activity within patient's tolerance   Frequency  Min 2X/week        Progress Toward Goals  OT Goals(current goals can now be found in the care  plan section)  Progress towards OT goals: Progressing toward goals  Acute Rehab OT Goals Patient Stated Goal: To get better OT Goal Formulation: With patient/family Time For Goal Achievement: 08/21/23 Potential to Achieve Goals: Good ADL Goals Pt Will Perform Grooming: with modified independence;standing Pt Will Transfer to Toilet: with modified independence;ambulating Pt Will Perform Toileting - Clothing Manipulation and hygiene: with modified independence Additional ADL Goal #1: Patient will identify at least 3 energy conservation strategies to employ at home in order to maximize function and quality of life and decrease caregiver burden while preventing exacerbation of symptoms and rehospitalization. Additional ADL Goal #2: Pt will use her handout as needed to identify at least 3 ways she can protect her joints and avoid progression of RA deformity and pain in hands/UEs while performing BADLs and IADLs.  Plan         AM-PAC OT 6 Clicks Daily Activity     Outcome Measure   Help from another person eating meals?: None Help from another person taking care of personal grooming?: None Help from another person toileting, which includes using toliet, bedpan, or urinal?: A Little Help from another person bathing (including washing, rinsing, drying)?: A Little Help from another person to put on and taking off regular upper body clothing?: A Little Help from another person to put on and taking off regular lower body clothing?: A Little 6 Click Score: 20    End of Session Equipment Utilized During Treatment: Rolling walker (2 wheels);Gait belt  OT Visit Diagnosis: Unsteadiness on feet (R26.81);Pain;Muscle weakness (generalized) (M62.81) Pain - part of body: Ankle and joints of foot   Activity Tolerance Patient limited by fatigue   Patient Left in chair;with call bell/phone within reach;with chair alarm set   Nurse Communication Mobility status        Time: 8643-8585 OT Time  Calculation (min): 18 min  Charges: OT General Charges $OT Visit: 1 Visit OT Treatments $Self Care/Home Management : 8-22 mins  Adrianne BROCKS, OT  Acute Rehabilitation Services Office [phone] Secure chat preferred   Adrianne GORMAN Savers 08/16/2023, 2:49 PM

## 2023-08-16 NOTE — Progress Notes (Signed)
   08/16/23 1300  Orthostatic Lying   BP- Lying (!) 120/95  Pulse- Lying 109  Orthostatic Sitting  BP- Sitting 102/86  Pulse- Sitting 136  Orthostatic Standing at 0 minutes  BP- Standing at 0 minutes (!) 83/49  Pulse- Standing at 0 minutes 140   During OT session pt orthostatic and symptomatic, maintaining O2 levels on RA. RN notified. See OT note for further details.  Mervin Ramires C, OT  Acute Rehabilitation Services Office [phone] Secure chat preferred

## 2023-08-16 NOTE — Progress Notes (Signed)
 PHARMACY NOTE:  ANTIMICROBIAL RENAL DOSAGE ADJUSTMENT  Current antimicrobial regimen includes a mismatch between antimicrobial dosage and estimated renal function.  As per policy approved by the Pharmacy & Therapeutics and Medical Executive Committees, the antimicrobial dosage will be adjusted accordingly.  Current antimicrobial dosage:  Ampicillin  2g IV every 6 hours  Indication: E faecalis bacteremia, concern for ICD infection  Renal Function:  Estimated Creatinine Clearance: 19.8 mL/min (A) (by C-G formula based on SCr of 1.94 mg/dL (H)). []      On intermittent HD, scheduled: []      On CRRT    Antimicrobial dosage has been changed to:  Ampicillin  2g IV every 8 hours  Additional comments:   Thank you for allowing pharmacy to be a part of this patient's care.  Almarie Lunger, PharmD, BCPS, BCIDP Infectious Diseases Clinical Pharmacist 08/16/2023 2:46 PM   **Pharmacist phone directory can now be found on [email or web address] ([email or web address]).  Listed under [facility].

## 2023-08-16 NOTE — Progress Notes (Signed)
 Mobility Specialist Progress Note;   08/16/23 1009  Mobility  Activity Transferred from bed to chair  Level of Assistance Contact guard assist, steadying assist  Assistive Device Front wheel walker  Distance Ambulated (ft) 10 ft  Activity Response Tolerated well  Mobility Referral Yes  Mobility visit 1 Mobility  Mobility Specialist Start Time (ACUTE ONLY) 1009  Mobility Specialist Stop Time (ACUTE ONLY) 1022  Mobility Specialist Time Calculation (min) (ACUTE ONLY) 13 min   Pt agreeable to mobility. Required MinG assistance for safety. BP supine 96/52 (64), BP sitting on side of bed 106/58 (73). Only c/o feeling weak. Pt able to ambulate around bed to recliner w/o fault. Pt left in recliner with all needs met, alarm on. Call bell in reach.   Lauraine Erm Mobility Specialist Please contact via SecureChat or [employer] [phone]

## 2023-08-17 DIAGNOSIS — I5022 Chronic systolic (congestive) heart failure: Secondary | ICD-10-CM | POA: Diagnosis not present

## 2023-08-17 DIAGNOSIS — N189 Chronic kidney disease, unspecified: Secondary | ICD-10-CM | POA: Diagnosis not present

## 2023-08-17 DIAGNOSIS — I951 Orthostatic hypotension: Secondary | ICD-10-CM | POA: Diagnosis not present

## 2023-08-17 DIAGNOSIS — N179 Acute kidney failure, unspecified: Secondary | ICD-10-CM | POA: Diagnosis not present

## 2023-08-17 LAB — MAGNESIUM: Magnesium: 1.8 mg/dL (ref 1.7–2.4)

## 2023-08-17 LAB — BASIC METABOLIC PANEL WITH GFR
Anion gap: 12 (ref 5–15)
BUN: 25 mg/dL — ABNORMAL HIGH (ref 8–23)
CO2: 23 mmol/L (ref 22–32)
Calcium: 8.5 mg/dL — ABNORMAL LOW (ref 8.9–10.3)
Chloride: 108 mmol/L (ref 98–111)
Creatinine, Ser: 1.77 mg/dL — ABNORMAL HIGH (ref 0.44–1.00)
GFR, Estimated: 30 mL/min — ABNORMAL LOW (ref >60)
Glucose, Bld: 84 mg/dL (ref 70–99)
Potassium: 4 mmol/L (ref 3.5–5.1)
Sodium: 143 mmol/L (ref 135–145)

## 2023-08-17 LAB — GLUCOSE, CAPILLARY
Glucose-Capillary: 182 mg/dL — ABNORMAL HIGH (ref 70–99)
Glucose-Capillary: 137 mg/dL — ABNORMAL HIGH (ref 70–99)
Glucose-Capillary: 140 mg/dL — ABNORMAL HIGH (ref 70–99)
Glucose-Capillary: 149 mg/dL — ABNORMAL HIGH (ref 70–99)

## 2023-08-17 MED ORDER — SACUBITRIL-VALSARTAN 24-26 MG PO TABS: 1.0000 | ORAL_TABLET | Freq: Two times a day (BID) | ORAL | Status: DC

## 2023-08-17 NOTE — Progress Notes (Signed)
 Cardiology Progress Note  Patient ID: Ebony Scott MRN: [id number] DOB: 1950-08-18 Date of Encounter: 08/17/2023 Primary Cardiologist: Shelda Bruckner, MD  Subjective   Chief Complaint: Hypotension   HPI: Continues to have hypotension. Entresto  held. Denies CP or SOB.   ROS:  All other ROS reviewed and negative. Pertinent positives noted in the HPI.     Telemetry  Overnight telemetry shows SR 70s, which I personally reviewed.    Physical Exam   Vitals:   08/17/23 0046 08/17/23 0516 08/17/23 1014 08/17/23 1130  BP: 123/67 (!) 145/63 (!) 124/51   Pulse: 74 68 70 68  Resp: 15 16 18 18   Temp: 98.6 F (37 C) 98.8 F (37.1 C) 98 F (36.7 C) 97.8 F (36.6 C)  TempSrc: Oral Oral Oral Oral  SpO2: 100% 98%    Weight:      Height:        Intake/Output Summary (Last 24 hours) at 08/17/2023 1257 Last data filed at 08/17/2023 0647 Gross per 24 hour  Intake 1080 ml  Output 600 ml  Net 480 ml       08/09/2023   10:54 PM 08/07/2023   11:00 PM 08/02/2023    2:12 PM  Last 3 Weights  Weight (lbs) 122 lb 5.7 oz 130 lb 1.1 oz 130 lb  Weight (kg) 55.5 kg 59 kg 58.968 kg    Body mass index is 22.38 kg/m.  General: Well nourished, well developed, in no acute distress Head: Atraumatic, normal size  Eyes: PEERLA, EOMI  Neck: Supple, no JVD Endocrine: No thryomegaly Cardiac: Normal S1, S2; RRR; no murmurs, rubs, or gallops Lungs: Clear to auscultation bilaterally, no wheezing, rhonchi or rales  Abd: Soft, nontender, no hepatomegaly  Ext: No edema, pulses 2+ Musculoskeletal: No deformities, BUE and BLE strength normal and equal Skin: Warm and dry, no rashes   Neuro: Alert and oriented to person, place, time, and situation, CNII-XII grossly intact, no focal deficits  Psych: Normal mood and affect   Cardiac Studies  TTE 08/08/2023  1. Left ventricular ejection fraction, by estimation, is 25 to 30%. The  left ventricle has severely decreased function. The left ventricle   demonstrates regional wall motion abnormalities (see scoring  diagram/findings for description). There is moderate  asymmetric left ventricular hypertrophy of the basal-septal segment. Left  ventricular diastolic parameters are consistent with Grade I diastolic  dysfunction (impaired relaxation).   2. Right ventricular systolic function is normal. The right ventricular  size is normal. There is normal pulmonary artery systolic pressure.   3. Left atrial size was moderately dilated.   4. The mitral valve is normal in structure. No evidence of mitral valve  regurgitation. No evidence of mitral stenosis.   5. The aortic valve is tricuspid. There is mild calcification of the  aortic valve. Aortic valve regurgitation is trivial. No aortic stenosis is  present.   6. The inferior vena cava is normal in size with greater than 50%  respiratory variability, suggesting right atrial pressure of 3 mmHg.   LHC 01/11/2021 SUMMARY Severe single-vessel CAD with 95% and 90% stenoses of the proximal and mid LAD at major 1st Diag branch.  Likely be culprit for reduced EF and anterior hypokinesis on echo. Successful DES PCI of the LAD using an Onyx Frontier DES 2.25 mm x 34 mm postdilated in tapered fashion from 2.6 to 2.23mm.   TIMI III flow pre and post, reduced to 0% Otherwise minimal CAD in the Right dominant system. Moderate mostly Secondary  Pulmonary Hypertension/Pulmonary Venous Hypertension: PAP-mean 57/19 mmHg - 39 mmHg with PCWP 33 million mercury (very large V wave), and LVEDP of 25 mmHg. Moderate to severely reduced cardiac function with Cardiac Output-Index of 4.05-2.59 by Fick and 3.75-2.40 by Thermal Dilution.  Patient Profile  Ebony Scott is a 73 y.o. female with systolic heart failure (EF 25-30%), CAD status post PCI to LAD, CKD stage IIIb, DVT/PE, rheumatoid arthritis, bronchiectasis admitted on 08/06/2023 with pneumonia and E faecalis bacteremia.  Assessment & Plan   # Chronic systolic  heart failure, EF 25-30% # Suspect ischemic cardiomyopathy - Currently admitted with pneumonia and bacteremia.  She will have her ICD explanted. - Has had issues with hypotension.  Would recommend to hold her Entresto  and BiDil .  Suspect she does need to get through her infection and device removal and then can restart medications. - She is not on a beta-blocker.  Unclear.  At this time we will hold on this. - She can be managed conservatively with Lasix  as needed.  She does not appear to be tolerating GDMT.  Likely would benefit from outpatient advanced heart failure evaluation. - ok to continue jardiance .  - CKD limits aldactone and digoxin. Limited options.   # Bacteremia # ICD with plans for extraction - Device extraction planned for 08/21/2023.  Please hold her Xarelto  starting 08/19/2023.  # CAD s/p PCI - can continue xarelto  monotherapy but will need to hold for ICD system removal.   # CKD 3b - close monitoring of kidney function. Does not appear to be tolerating entresto .   # Bacteremia - per hospital team.      For questions or updates, please contact [facility] Please consult [email or web address] for contact info under        Signed, Darryle T. Barbaraann, MD, [facility]  [facility]  08/17/2023 12:57 PM

## 2023-08-17 NOTE — Progress Notes (Signed)
 Mobility Specialist Progress Note;   08/17/23 1124  Mobility  Activity Transferred from bed to chair  Level of Assistance Contact guard assist, steadying assist  Assistive Device Front wheel walker  Distance Ambulated (ft) 7 ft  Activity Response Tolerated well  Mobility Referral Yes  Mobility visit 1 Mobility  Mobility Specialist Start Time (ACUTE ONLY) 1124  Mobility Specialist Stop Time (ACUTE ONLY) 1136  Mobility Specialist Time Calculation (min) (ACUTE ONLY) 12 min   Pt agreeable to mobility. BP supine 108/50 (66), BP sitting on EoB 121/60 (78). Required MinG assistance to ambulate around bed to chair. VSS throughout. Only c/o feeling fatigued and weak. Pt left in chair with all needs met, alarm on.   Lauraine Erm Mobility Specialist Please contact via SecureChat or [employer] [phone]

## 2023-08-17 NOTE — Progress Notes (Signed)
 PROGRESS NOTE   Ebony Scott  FMW:[id number]    DOB: 06-14-1950    DOA: 08/06/2023  PCP: [name], MD   I have briefly reviewed patients previous medical records in [facility].   Brief Hospital Course:   73 y.o. 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  She is admitted for CIED associated E faecalis bacteremia, community acquired pneumonia, acute respiratory failure and AKI.  ID following.  EP plan extraction of ICD on 7/2.   Assessment & Plan:   CIED associated Enterococcus faecalis bacteremia ID consultation and follow-up appreciated. Continuing IV ampicillin  and ceftriaxone  per OPAT protocol EP follow-up appreciated and plan ICD extraction for 7/2.  6/17: 1 of 2 sets of blood cultures positive for Enterococcus faecalis, sensitive to ampicillin , vancomycin  and gentamicin  synergy Surveillance blood cultures x 2 from 6/19: Negative, final report. TEE 6/23: No thrombus.  No vegetation noted on RV lead.  No valvular vegetations.  Sepsis due to community-acquired pneumonia/recurrent pneumonitis Completed IV antibiotic course for same. Patient reportedly has history of immunosuppression and recurrent pneumonias. Pulmonology evaluated.  Acute kidney injury on stage IIIb chronic kidney disease Baseline creatinine reportedly 1.3-1.5. Had presented with creatinine of 2.14, creatinine had improved but back up from 1.53 > 2.1 on 6/26, slowly improving but still not back to baseline. Worsening creatinine likely related  to hypotension on 6/26, ATN and meds. Now that Lasix , BiDil , Entresto  have all been discontinued, hope for continued improvement.  Creatinine down to 1.77.  Acute on chronic respiratory failure with hypoxia/bronchiectasis At baseline patient reportedly uses [place] oxygen  2 to 3 L/min at night. Currently weaned off to room air.  Acute respiratory failure has resolved.  Ischemic cardiomyopathy/chronic HFrEF/s/p ICD Clinically euvolemic. As per EP Cardiology, she will need to continue on Xarelto  through 6/29 and no doses after in anticipation of extraction.  They recommend holding Xarelto  on 6/30, 7/1 and 7/2.  See EP Cardiology note from 6/26 for details. Orthostatic yesterday.  Clinically euvolemic.  Lasix , BiDil  and Entresto  are discontinued at this time.  Plan to use Lasix  as needed.  Cardiology following.  Not tolerating GDMT due to hypotension, AKI etc.  CAD s/p DES to LAD Cardiology following.  Continue atorvastatin .  Plavix  discontinued by cardiology 6/27.  Hypotension Due to Lasix  diuresis, was on it daily for several days.  At home she only takes it twice a week.  Also was on BiDil  and Entresto . Got IV fluid bolus couple days back.  For now discontinued Lasix , BiDil  and Entresto .  Follow closely.  BP is better but orthostatic yesterday.  History of PE/DVT Management of Xarelto  as noted above  Hypogammaglobulinemia On IVIG as outpatient.  Seropositive rheumatoid arthritis/chronic steroid use Back on prednisone  5 Mg daily after stress dose steroids early on in admission Has a scheduled follow-up with rheumatology on 6/25 but since hospitalized, this will need to be rescheduled Confirmed with patient's daughter that patient is on maintenance prednisone  of 5 Mg daily.  Given possibility of adrenal insufficiency, will give additional dose of 5 mg prednisone  today and increased prednisone  to 10 mg daily  briefly until blood pressure stabilizes.  Will start tapering from tomorrow to 7.5 Mg for  2 days and then back to her 5 Mg daily.  Type II DM, controlled A1c 6.5 on 6/18 CBGs well-controlled, discontinued SSI.  Chronic pain syndrome Continue gabapentin   Hypokalemia Replaced.  Generalized weakness/physical deconditioning Presented initially recommended home health PT and OT.  However she has since declined and per PT recommendation on 6/26, SNF is recommended.  TOC on board.  Body mass index is 22.38 kg/m.   DVT prophylaxis:      Code Status: Limited: Do not attempt resuscitation (DNR) -DNR-LIMITED -Do Not Intubate/DNI :  Family Communication: None at bedside. Disposition:  Status is: Inpatient Remains inpatient appropriate because: Agement of hypotension as noted above.  Ongoing IV antibiotics.  SNF at discharge.     Consultants:   EP Cardiology Infectious disease Critical care  Procedures:   As noted above  Subjective:  Apart from daily complaint of generalized weakness, no new complaints.  Objective:   Vitals:   08/17/23 0046 08/17/23 0516 08/17/23 1014 08/17/23 1130  BP: 123/67 (!) 145/63 (!) 124/51   Pulse: 74 68 70 68  Resp: 15 16 18 18   Temp: 98.6 F (37 C) 98.8 F (37.1 C) 98 F (36.7 C) 97.8 F (36.6 C)  TempSrc: Oral Oral Oral Oral  SpO2: 100% 98%    Weight:      Height:        General exam: Elderly female, moderately built and nourished, chronically ill looking, lying in bed without distress. Respiratory system: Clear to auscultation. Respiratory effort normal. Cardiovascular system: S1 & S2 heard, RRR. No JVD, murmurs, rubs, gallops or clicks. No pedal edema.  Telemetry personally reviewed: Sinus rhythm. Gastrointestinal system: Abdomen is nondistended, soft and nontender. No organomegaly or masses felt. Normal bowel sounds heard. Central nervous system: Alert and oriented. No focal neurological deficits. Extremities: Symmetric 5 x 5 power. Skin: No rashes, lesions or ulcers Psychiatry: Judgement and insight appear normal. Mood &  affect appropriate.     Data Reviewed:   I have personally reviewed following labs and imaging studies   CBC: Recent Labs  Lab 08/12/23 0657 08/15/23 1836  WBC 11.5* 12.4*  NEUTROABS 8.0*  --   HGB 9.0* 9.8*  HCT 28.6* 30.4*  MCV 102.1* 99.0  PLT 149* 179    Basic Metabolic Panel: Recent Labs  Lab 08/13/23 0428 08/14/23 0449 08/15/23 1836 08/16/23 0743 08/17/23 0708  NA 137 137 137 138 143  K 3.5 3.6 3.8 3.4* 4.0  CL 104 103 99 103 108  CO2 24 25 23 26 23   GLUCOSE 88 87 240* 86 84  BUN 19 19 23 23  25*  CREATININE 1.59* 1.53* 2.10* 1.94* 1.77*  CALCIUM  8.6* 8.7* 8.8* 8.6* 8.5*  MG  --   --   --   --  1.8    Liver Function Tests: Recent Labs  Lab 08/12/23 0657 08/13/23 0428 08/14/23 0449  AST 44* 47* 38  ALT 32 34 29  ALKPHOS 29* 34* 36*  BILITOT 0.6 0.5 0.6  PROT 5.4* 5.8* 6.2*  ALBUMIN 2.4* 2.4* 2.6*    CBG: Recent Labs  Lab 08/16/23 2135 08/17/23 0828 08/17/23 1200  GLUCAP 138* 182* 137*    Microbiology Studies:   Recent Results (from the past 240 hours)  Culture, blood (Routine X 2) w Reflex to ID Panel     Status: None   Collection Time: 08/08/23  1:11 PM   Specimen: BLOOD  RIGHT HAND  Result Value Ref Range Status   Specimen Description BLOOD RIGHT HAND  Final   Special Requests   Final    BOTTLES DRAWN AEROBIC ONLY Blood Culture adequate volume   Culture   Final    NO GROWTH 5 DAYS Performed at [facility], [address] [place], [place] [address]    Report Status 08/13/2023 FINAL  Final  Culture, blood (Routine X 2) w Reflex to ID Panel     Status: None   Collection Time: 08/08/23  1:12 PM   Specimen: BLOOD RIGHT HAND  Result Value Ref Range Status   Specimen Description BLOOD RIGHT HAND  Final   Special Requests   Final    BOTTLES DRAWN AEROBIC ONLY Blood Culture adequate volume   Culture   Final    NO GROWTH 5 DAYS Performed at [facility], [address], [place], [place] [address]    Report Status  08/13/2023 FINAL  Final    Radiology Studies:  No results found.   Scheduled Meds:    acidophilus  1 capsule Oral TID   atorvastatin   80 mg Oral QPM   budesonide -glycopyrrolate -formoterol   2 puff Inhalation BID   Chlorhexidine  Gluconate Cloth  6 each Topical Daily   cholecalciferol   1,000 Units Oral QHS   empagliflozin   10 mg Oral Daily   fluticasone   2 spray Each Nare Daily   folic acid   1 mg Oral Daily   gabapentin   300 mg Oral QHS   guaiFENesin   600 mg Oral BID   insulin  aspart  0-5 Units Subcutaneous QHS   insulin  aspart  0-9 Units Subcutaneous TID WC   nystatin  cream   Topical TID   pantoprazole   40 mg Oral Daily   predniSONE   7.5 mg Oral Q breakfast   rivaroxaban   20 mg Oral Daily   sodium chloride  flush  10-40 mL Intracatheter Q12H    Continuous Infusions:    ampicillin  (OMNIPEN) IV 2 g (08/17/23 1352)   cefTRIAXone  (ROCEPHIN )  IV 2 g (08/17/23 1016)     LOS: 10 days     Trenda Mar, MD,  FACP, [facility], [facility], [facility]   [facility] [place]      To contact the attending provider between 7A-7P or the covering provider during after hours 7P-7A, please log into the web site [email or web address] and access using universal [place] password for that web site. If you do not have the password, please call the hospital operator.  08/17/2023, 2:39 PM

## 2023-08-18 DIAGNOSIS — R053 Chronic cough: Secondary | ICD-10-CM

## 2023-08-18 DIAGNOSIS — I5022 Chronic systolic (congestive) heart failure: Secondary | ICD-10-CM | POA: Diagnosis not present

## 2023-08-18 LAB — BASIC METABOLIC PANEL WITH GFR
Anion gap: 8 (ref 5–15)
BUN: 20 mg/dL (ref 8–23)
CO2: 25 mmol/L (ref 22–32)
Calcium: 8.3 mg/dL — ABNORMAL LOW (ref 8.9–10.3)
Chloride: 106 mmol/L (ref 98–111)
Creatinine, Ser: 1.55 mg/dL — ABNORMAL HIGH (ref 0.44–1.00)
GFR, Estimated: 35 mL/min — ABNORMAL LOW (ref >60)
Glucose, Bld: 85 mg/dL (ref 70–99)
Potassium: 4 mmol/L (ref 3.5–5.1)
Sodium: 139 mmol/L (ref 135–145)

## 2023-08-18 LAB — GLUCOSE, CAPILLARY
Glucose-Capillary: 94 mg/dL (ref 70–99)
Glucose-Capillary: 93 mg/dL (ref 70–99)
Glucose-Capillary: 131 mg/dL — ABNORMAL HIGH (ref 70–99)
Glucose-Capillary: 109 mg/dL — ABNORMAL HIGH (ref 70–99)

## 2023-08-18 MED ORDER — GUAIFENESIN-DM 100-10 MG/5ML PO SYRP
10.0000 mL | ORAL_SOLUTION | ORAL | Status: DC | PRN
  Administered 2023-08-18: 10 mL via ORAL
  Filled 2023-08-18: qty 10

## 2023-08-18 MED ORDER — BENZONATATE 100 MG PO CAPS
200.0000 mg | ORAL_CAPSULE | Freq: Three times a day (TID) | ORAL | Status: DC
  Administered 2023-08-18 – 2023-08-22 (×13): 200 mg via ORAL
  Filled 2023-08-18 (×13): qty 2

## 2023-08-18 NOTE — Progress Notes (Signed)
 PROGRESS NOTE   Ebony Scott  FMW:[id number]    DOB: [birth year]    DOA: 08/06/2023  PCP: [name], MD   I have briefly reviewed patients previous medical records in [facility].   Brief Hospital Course:   73 y.o. 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  She is admitted for CIED associated E faecalis bacteremia, community acquired pneumonia, acute respiratory failure and AKI.  ID following.  EP plan extraction of ICD on 7/2.   Assessment & Plan:   CIED associated Enterococcus faecalis bacteremia ID consultation and follow-up appreciated. Continuing IV ampicillin  and ceftriaxone  per OPAT protocol EP follow-up appreciated and plan ICD extraction for 7/2.  6/17: 1 of 2 sets of blood cultures positive for Enterococcus faecalis, sensitive to ampicillin , vancomycin  and gentamicin  synergy Surveillance blood cultures x 2 from 6/19: Negative, final report. TEE 6/23: No thrombus.  No vegetation noted on RV lead.  No valvular vegetations.  Sepsis due to community-acquired pneumonia/recurrent pneumonitis Completed IV antibiotic course for same. Patient reportedly has history of immunosuppression and recurrent pneumonias. Pulmonology evaluated.  Acute kidney injury on stage IIIb chronic kidney disease Baseline creatinine reportedly 1.3-1.5. Had presented with creatinine of 2.14, creatinine had improved but back up from 1.53 > 2.1 on 6/26 Worsening creatinine likely related to hypotension on 6/26, ATN and meds. Lasix ,  BiDil , Entresto  have all been discontinued, creatinine back to recent baseline at 1.55. Follow BMP in a.m. and consider resuming may be one of her cardiac meds.  Acute on chronic respiratory failure with hypoxia/bronchiectasis At baseline patient reportedly uses [place] oxygen  2 to 3 L/min at night. Currently weaned off to room air.  Acute respiratory failure has resolved.  Ischemic cardiomyopathy/chronic HFrEF/s/p ICD Clinically euvolemic. As per EP Cardiology, she will need to continue on Xarelto  through 6/29 and no doses after in anticipation of extraction.  They recommend holding Xarelto  on 6/30, 7/1 and 7/2.  See EP Cardiology note from 6/26 for details. Orthostatic yesterday.  Clinically euvolemic.  Lasix , BiDil  and Entresto  are discontinued at this time.  Plan to use Lasix  as needed.  Cardiology following.  Not tolerating GDMT due to hypotension, AKI etc.  CAD s/p DES to LAD Cardiology following.  Continue atorvastatin .  Plavix  discontinued by cardiology 6/27.  Hypotension Due to Lasix  diuresis, was on it daily for several days.  At home she only takes it twice a week.  Also was on BiDil  and Entresto . Got IV fluid bolus couple days back.  For now discontinued Lasix , BiDil  and Entresto . Blood pressures have normalized.  May be starting to creep up.  Could consider resuming one of her meds.  History of PE/DVT Management of Xarelto  as noted above  Hypogammaglobulinemia On IVIG as outpatient.  Seropositive rheumatoid arthritis/chronic steroid use Back on prednisone  5 Mg daily after stress dose steroids early on in admission Has a scheduled follow-up with rheumatology on 6/25 but since hospitalized, this will need to be rescheduled Confirmed with patient's daughter that patient is on maintenance prednisone  of 5 Mg daily.  Given possibility of adrenal insufficiency, will give additional dose of  5 mg prednisone  today and increased prednisone  to 10 mg daily briefly until blood pressure  stabilizes.  Will start tapering from tomorrow to 7.5 Mg for 2 days and then back to her 5 Mg daily.  Type II DM, controlled A1c 6.5 on 6/18 CBGs well-controlled, discontinued SSI.  Chronic pain syndrome Continue gabapentin   Hypokalemia Replaced.  Generalized weakness/physical deconditioning Presented initially recommended home health PT and OT.  However she has since declined and per PT recommendation on 6/26, SNF is recommended.  TOC on board.  Chronic cough Reports that this has been going on for maybe a year and is unchanged. Initiated Tessalon  Perles and Robitussin DM as needed.  Monitor.  Body mass index is 22.38 kg/m.   DVT prophylaxis:      Code Status: Limited: Do not attempt resuscitation (DNR) -DNR-LIMITED -Do Not Intubate/DNI :  Family Communication: None at bedside. Disposition:  Status is: Inpatient Remains inpatient appropriate because: Agement of hypotension as noted above.  Ongoing IV antibiotics.  SNF at discharge.  Plan for ICD explantation on 7/2.     Consultants:   EP Cardiology Infectious disease Critical care  Procedures:   As noted above  Subjective:  As I walked in, noted patient to be coughing.  She stated that this is not new and has been going on for a year.  Nonproductive.  Objective:   Vitals:   08/17/23 2338 08/18/23 0455 08/18/23 0746 08/18/23 0747  BP: 139/64 (!) 140/59 (!) 160/63   Pulse: 65 62 64 60  Resp: 16 20    Temp: 98.8 F (37.1 C) 98.8 F (37.1 C)  98.3 F (36.8 C)  TempSrc: Oral Oral  Oral  SpO2: 95% 100% 100% 100%  Weight:      Height:        General exam: Elderly female, moderately built and nourished, chronically ill looking, lying in bed without distress. Respiratory system: Clear to auscultation.  No increased work of breathing. Cardiovascular system: S1 & S2 heard, RRR. No JVD, murmurs, rubs, gallops or clicks. No pedal edema.  Telemetry personally reviewed: Sinus rhythm Gastrointestinal system: Abdomen is  nondistended, soft and nontender. No organomegaly or masses felt. Normal bowel sounds heard. Central nervous system: Alert and oriented. No focal neurological deficits. Extremities: Symmetric 5 x 5 power. Skin: No rashes, lesions or ulcers Psychiatry: Judgement and insight appear normal. Mood & affect appropriate.     Data Reviewed:   I have personally reviewed following labs and imaging studies   CBC: Recent Labs  Lab 08/12/23 0657 08/15/23 1836  WBC 11.5* 12.4*  NEUTROABS 8.0*  --   HGB 9.0* 9.8*  HCT 28.6* 30.4*  MCV 102.1* 99.0  PLT 149* 179    Basic Metabolic Panel: Recent Labs  Lab 08/14/23 0449 08/15/23 1836 08/16/23 0743 08/17/23 0708 08/18/23 0708  NA 137 137 138 143 139  K 3.6 3.8 3.4* 4.0 4.0  CL 103 99 103 108 106  CO2 25 23 26 23 25   GLUCOSE 87 240* 86 84 85  BUN 19 23 23  25* 20  CREATININE 1.53* 2.10* 1.94* 1.77* 1.55*  CALCIUM  8.7* 8.8* 8.6* 8.5* 8.3*  MG  --   --   --  1.8  --     Liver Function Tests: Recent Labs  Lab 08/12/23 0657 08/13/23 0428 08/14/23 0449  AST 44* 47* 38  ALT 32 34 29  ALKPHOS 29* 34* 36*  BILITOT 0.6 0.5 0.6  PROT 5.4* 5.8* 6.2*  ALBUMIN 2.4* 2.4* 2.6*  CBG: Recent Labs  Lab 08/17/23 1709 08/17/23 2110 08/18/23 0746  GLUCAP 140* 149* 94    Microbiology Studies:   Recent Results (from the past 240 hours)  Culture, blood (Routine X 2) w Reflex to ID Panel     Status: None   Collection Time: 08/08/23  1:11 PM   Specimen: BLOOD RIGHT HAND  Result Value Ref Range Status   Specimen Description BLOOD RIGHT HAND  Final   Special Requests   Final    BOTTLES DRAWN AEROBIC ONLY Blood Culture adequate volume   Culture   Final    NO GROWTH 5 DAYS Performed at [facility], [address], [place], [place] [address]    Report Status 08/13/2023 FINAL  Final  Culture, blood (Routine X 2) w Reflex to ID Panel     Status: None   Collection Time: 08/08/23  1:12 PM   Specimen: BLOOD RIGHT HAND  Result  Value Ref Range Status   Specimen Description BLOOD RIGHT HAND  Final   Special Requests   Final    BOTTLES DRAWN AEROBIC ONLY Blood Culture adequate volume   Culture   Final    NO GROWTH 5 DAYS Performed at [facility], [address] [place], [place] [address]    Report Status 08/13/2023 FINAL  Final    Radiology Studies:  No results found.   Scheduled Meds:    acidophilus  1 capsule Oral TID   atorvastatin   80 mg Oral QPM   benzonatate   200 mg Oral TID   budesonide -glycopyrrolate -formoterol   2 puff Inhalation BID   Chlorhexidine  Gluconate Cloth  6 each Topical Daily   cholecalciferol   1,000 Units Oral QHS   empagliflozin   10 mg Oral Daily   fluticasone   2 spray Each Nare Daily   folic acid   1 mg Oral Daily   gabapentin   300 mg Oral QHS   insulin  aspart  0-5 Units Subcutaneous QHS   insulin  aspart  0-9 Units Subcutaneous TID WC   nystatin  cream   Topical TID   pantoprazole   40 mg Oral Daily   predniSONE   7.5 mg Oral Q breakfast   rivaroxaban   20 mg Oral Daily   sodium chloride  flush  10-40 mL Intracatheter Q12H    Continuous Infusions:    ampicillin  (OMNIPEN) IV 2 g (08/18/23 0647)   cefTRIAXone  (ROCEPHIN )  IV 2 g (08/18/23 1022)     LOS: 11 days     Trenda Mar, MD,  FACP, [facility], [facility], [facility]   [facility] [place]      To contact the attending provider between 7A-7P or the covering provider during after hours 7P-7A, please log into the web site [email or web address] and access using universal [place] password for that web site. If you do not have the password, please call the hospital operator.  08/18/2023, 10:57 AM

## 2023-08-19 DIAGNOSIS — Z7901 Long term (current) use of anticoagulants: Secondary | ICD-10-CM

## 2023-08-19 DIAGNOSIS — I5022 Chronic systolic (congestive) heart failure: Secondary | ICD-10-CM | POA: Diagnosis not present

## 2023-08-19 DIAGNOSIS — Z95810 Presence of automatic (implantable) cardiac defibrillator: Secondary | ICD-10-CM | POA: Diagnosis not present

## 2023-08-19 DIAGNOSIS — Z5181 Encounter for therapeutic drug level monitoring: Secondary | ICD-10-CM | POA: Diagnosis not present

## 2023-08-19 DIAGNOSIS — I255 Ischemic cardiomyopathy: Secondary | ICD-10-CM

## 2023-08-19 DIAGNOSIS — R053 Chronic cough: Secondary | ICD-10-CM | POA: Diagnosis not present

## 2023-08-19 LAB — BASIC METABOLIC PANEL WITH GFR
Anion gap: 12 (ref 5–15)
BUN: 18 mg/dL (ref 8–23)
CO2: 22 mmol/L (ref 22–32)
Calcium: 8.5 mg/dL — ABNORMAL LOW (ref 8.9–10.3)
Chloride: 104 mmol/L (ref 98–111)
Creatinine, Ser: 1.41 mg/dL — ABNORMAL HIGH (ref 0.44–1.00)
GFR, Estimated: 39 mL/min — ABNORMAL LOW
Glucose, Bld: 83 mg/dL (ref 70–99)
Potassium: 4.1 mmol/L (ref 3.5–5.1)
Sodium: 138 mmol/L (ref 135–145)

## 2023-08-19 LAB — GLUCOSE, CAPILLARY
Glucose-Capillary: 86 mg/dL (ref 70–99)
Glucose-Capillary: 132 mg/dL — ABNORMAL HIGH (ref 70–99)
Glucose-Capillary: 178 mg/dL — ABNORMAL HIGH (ref 70–99)
Glucose-Capillary: 145 mg/dL — ABNORMAL HIGH (ref 70–99)

## 2023-08-19 MED ORDER — SACUBITRIL-VALSARTAN 24-26 MG PO TABS
1.0000 | ORAL_TABLET | Freq: Two times a day (BID) | ORAL | Status: DC
  Administered 2023-08-19 – 2023-08-22 (×7): 1 via ORAL
  Filled 2023-08-19 (×7): qty 1

## 2023-08-19 MED ORDER — PREDNISONE 5 MG PO TABS
5.0000 mg | ORAL_TABLET | Freq: Every day | ORAL | Status: DC
  Administered 2023-08-20 – 2023-08-22 (×3): 5 mg via ORAL
  Filled 2023-08-19 (×3): qty 1

## 2023-08-19 NOTE — TOC Progression Note (Addendum)
 Transition of Care [facility]) - Progression Note    Patient Details  Name: HOLDEN DRAUGHON MRN: [id number] Date of Birth: May 21, 1950  Transition of Care [facility] CM/SW Contact  Isaiah Public, LCSWA Phone Number: 08/19/2023, 1:33 PM  Clinical Narrative:     CSW spoke with patient at bedside and provided medicare compare ratings list with accepted SNF bed offers for review. Patient informed CSW that discussed SNF bed offers with her daughter and her number one choice is [facility] and 2nd choice is [facility]. CSW called and spoke with Leontine with [facility] to confirm SNF bed for patient. Leontine is going to review and give CSW call back. CSW will continue to follow. CSW following to start insurance authorization for SNF closer to patient being medically ready for dc.  Update- CSW received call back from Roseland with [facility] who informed CSW unable to offer SNF bed currently for patient. CSW informed patient. Patient would like to go to [facility] for short term rehab. CSW spoke with Kia who is reviewing referral and will confirm shortly if they can offer SNF bed for patient.  Update- Kia with [facility] confirmed SNF bed for patient. CSW following to start insurance authorization closer to patient being medically ready.  Expected Discharge Plan: Skilled Nursing Facility Barriers to Discharge: Continued Medical Work up  Expected Discharge Plan and Services In-house Referral: NA Discharge Planning Services: CM Consult Post Acute Care Choice: Home Health, Resumption of Svcs/PTA Provider Living arrangements for the past 2 months: Single Family Home                 DME Arranged: IV pump/equipment DME Agency:  [facility]) Date DME Agency Contacted: 08/14/23 Time DME Agency Contacted: 1154 Representative spoke with at DME Agency: Pam HH Arranged: RN, Disease Management, PT, OT, Nurse's Aide HH Agency: [facility] Date [facility] Agency Contacted: 08/14/23 Time HH Agency Contacted: 1159 Representative spoke with at [facility] Agency:  Arna   Social Determinants of Health (SDOH) Interventions SDOH Screenings   Food Insecurity: No Food Insecurity (08/07/2023)  Housing: Low Risk  (08/07/2023)  Transportation Needs: No Transportation Needs (08/07/2023)  Utilities: Not At Risk (08/07/2023)  Alcohol  Screen: Low Risk  (11/22/2022)  Depression (PHQ2-9): Low Risk  (05/29/2023)  Financial Resource Strain: Low Risk  (03/26/2023)  Physical Activity: Inactive (03/26/2023)  Social Connections: Socially Isolated (08/07/2023)  Stress: Stress Concern Present (03/26/2023)  Tobacco Use: Low Risk  (08/12/2023)  Health Literacy: Adequate Health Literacy (11/22/2022)    Readmission Risk Interventions    08/14/2023   11:49 AM 05/20/2023   12:38 PM 12/06/2022    2:19 PM  Readmission Risk Prevention Plan  Transportation Screening Complete Complete Complete  PCP or Specialist Appt within 3-5 Days   Complete  HRI or Home Care Consult   Complete  Social Work Consult for Recovery Care Planning/Counseling   Complete  Palliative Care Screening   Complete  Medication Review [facility]) Referral to Pharmacy Complete Complete  PCP or Specialist appointment within 3-5 days of discharge  Complete   HRI or Home Care Consult Complete Complete   SW Recovery Care/Counseling Consult Complete Complete   Palliative Care Screening Not Applicable Not Applicable   Skilled Nursing Facility Not Applicable Complete

## 2023-08-19 NOTE — Progress Notes (Signed)
 Cardiology Progress Note  Patient ID: Ebony Scott MRN: [id number] DOB: 1950/02/25 Date of Encounter: 08/19/2023 Primary Cardiologist: Shelda Bruckner, MD  Subjective    BP better no complaints   ROS:  All other ROS reviewed and negative. Pertinent positives noted in the HPI.     Telemetry  Overnight telemetry shows SR 70s, which I personally reviewed.    Physical Exam   Vitals:   08/19/23 0000 08/19/23 0350 08/19/23 0749 08/19/23 0757  BP:  (!) 144/59 (!) 151/66   Pulse: 66  62   Resp:  18 16   Temp:  97.9 F (36.6 C) 98.7 F (37.1 C)   TempSrc:  Oral Oral   SpO2: 95% 95%  99%  Weight:      Height:        Intake/Output Summary (Last 24 hours) at 08/19/2023 0809 Last data filed at 08/19/2023 0700 Gross per 24 hour  Intake 236 ml  Output 1450 ml  Net -1214 ml       08/09/2023   10:54 PM 08/07/2023   11:00 PM 08/02/2023    2:12 PM  Last 3 Weights  Weight (lbs) 122 lb 5.7 oz 130 lb 1.1 oz 130 lb  Weight (kg) 55.5 kg 59 kg 58.968 kg    Body mass index is 22.38 kg/m.   Affect appropriate Healthy:  appears stated age HEENT: normal Neck supple with no adenopathy JVP normal no bruits no thyromegaly Lungs clear with no wheezing and good diaphragmatic motion Heart:  S1/S2 no murmur, no rub, gallop or click PMI normal Abdomen: benighn, BS positve, no tenderness, no AAA no bruit.  No HSM or HJR Distal pulses intact with no bruits No edema PIC line RUE Neuro non-focal Skin warm and dry No muscular weakness   Cardiac Studies  TTE 08/08/2023  1. Left ventricular ejection fraction, by estimation, is 25 to 30%. The  left ventricle has severely decreased function. The left ventricle  demonstrates regional wall motion abnormalities (see scoring  diagram/findings for description). There is moderate  asymmetric left ventricular hypertrophy of the basal-septal segment. Left  ventricular diastolic parameters are consistent with Grade I diastolic  dysfunction  (impaired relaxation).   2. Right ventricular systolic function is normal. The right ventricular  size is normal. There is normal pulmonary artery systolic pressure.   3. Left atrial size was moderately dilated.   4. The mitral valve is normal in structure. No evidence of mitral valve  regurgitation. No evidence of mitral stenosis.   5. The aortic valve is tricuspid. There is mild calcification of the  aortic valve. Aortic valve regurgitation is trivial. No aortic stenosis is  present.   6. The inferior vena cava is normal in size with greater than 50%  respiratory variability, suggesting right atrial pressure of 3 mmHg.   LHC 01/11/2021 SUMMARY Severe single-vessel CAD with 95% and 90% stenoses of the proximal and mid LAD at major 1st Diag branch.  Likely be culprit for reduced EF and anterior hypokinesis on echo. Successful DES PCI of the LAD using an Onyx Frontier DES 2.25 mm x 34 mm postdilated in tapered fashion from 2.6 to 2.29mm.   TIMI III flow pre and post, reduced to 0% Otherwise minimal CAD in the Right dominant system. 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 Moderate to severely reduced cardiac function with Cardiac Output-Index of 4.05-2.59 by [place]  and 3.75-2.40 by Thermal Dilution.  Patient Profile  Ebony Scott is a 73 y.o. female with systolic heart failure (EF 25-30%), CAD status post PCI to LAD, CKD stage IIIb, DVT/PE, rheumatoid arthritis, bronchiectasis admitted on 08/06/2023 with pneumonia and E faecalis bacteremia.  Assessment & Plan   # Chronic systolic heart failure, EF 25-30% # Suspect ischemic cardiomyopathy - BP more elevated resume entresto  - She is not on a beta-blocker.  Unclear.  At this time we will hold on this. - escalate GDMT post AICD explant  - ok to continue jardiance .  - CKD limits aldactone and digoxin.  Limited options.   # Bacteremia # ICD with plans for extraction - Device extraction planned for 08/21/2023.  Dr Waddell saw last week - Discussed with EP PA to see patient today/tomorrow to go over risks again  # CAD s/p PCI - can continue xarelto  monotherapy but will need to hold for ICD system removal. Xarelto  held as of today  - DVT/PE distant 2014 and 2019   # CKD 3b - close monitoring of kidney function.  Cr improved 1.41 this am with K 4.1   # Bacteremia - per hospital team. On ampicillin  and ceftriaxone      For questions or updates, please contact [facility] Please consult [email or web address] for contact info under     Maude Emmer MD [facility]

## 2023-08-19 NOTE — Progress Notes (Addendum)
 PT Cancellation Note  Patient Details Name: Ebony Scott MRN: [id number] DOB: [birth year]   Cancelled Treatment:    Reason Eval/Treat Not Completed: Fatigue/lethargy limiting ability to participate Attempted x 2 this am, first time getting bath, 2nd time patient too tired. Will re-attempt later this pm if time allows.   Addendum: re-attempted this pm, patient continues to decline due to fatigue. Will re-attempt at later date.    Leesha Veno 08/19/2023, 11:10 AM

## 2023-08-19 NOTE — Progress Notes (Signed)
 PROGRESS NOTE   Ebony Scott  FMW:[id number]    DOB: 1951/02/17    DOA: 08/06/2023  PCP: [name], MD   I have briefly reviewed patients previous medical records in [facility].   Brief Hospital Course:   73 y.o. 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  She is admitted for CIED associated E faecalis bacteremia, community acquired pneumonia, acute respiratory failure and AKI.  ID following.  EP plan extraction of ICD on 7/2.   Assessment & Plan:   CIED associated Enterococcus faecalis bacteremia ID consultation and follow-up appreciated. Continuing IV ampicillin  and ceftriaxone  per OPAT protocol EP follow-up appreciated and plan ICD extraction for 7/2.  6/17: 1 of 2 sets of blood cultures positive for Enterococcus faecalis, sensitive to ampicillin , vancomycin  and gentamicin  synergy Surveillance blood cultures x 2 from 6/19: Negative, final report. TEE 6/23: No thrombus.  No vegetation noted on RV lead.  No valvular vegetations.  Sepsis due to community-acquired pneumonia/recurrent pneumonitis Completed IV antibiotic course for same. Patient reportedly has history of immunosuppression and recurrent pneumonias. Pulmonology evaluated.  Acute kidney injury on stage IIIb chronic kidney disease Baseline creatinine reportedly 1.3-1.5. Had presented with creatinine of 2.14, creatinine had improved but went back again to 2.1 a few days ago, related to hypotension on 6/26, ATN and meds. Lasix , BiDil , Entresto  were  discontinued, creatinine back to recent baseline at 1.41 With rising BP and creatinine improving, Cardiology have resumed Entresto .  Follow BMP in 48 hours.  Acute on chronic respiratory failure with hypoxia/bronchiectasis At baseline patient reportedly uses [place] oxygen  2 to 3 L/min at night. Currently weaned off to room air.  Acute respiratory failure has resolved.  Ischemic cardiomyopathy/chronic HFrEF/s/p ICD Clinically euvolemic. As per EP Cardiology, she will need to continue on Xarelto  through 6/29 and no doses after in anticipation of extraction.  They recommend holding Xarelto  on 6/30, 7/1 and 7/2.  See EP Cardiology note from 6/26 for details. Orthostatic on the weekend.  Clinically euvolemic.  Lasix , BiDil  and Entresto  were discontinued but Entresto  being resumed.  CAD s/p DES to LAD Cardiology following.  Continue atorvastatin .  Plavix  discontinued by cardiology 6/27.  Hypotension Due to Lasix  diuresis, was on it daily for several days.  At home she only takes it twice a week.  Also was on BiDil  and Entresto . Got IV fluid bolus couple days back.  For now discontinued Lasix , BiDil . Resolved.  History of PE/DVT Management of Xarelto  as noted above  Hypogammaglobulinemia On IVIG as outpatient.  Seropositive rheumatoid arthritis/chronic steroid use Back on prednisone  5 Mg daily after stress dose steroids early on in admission Has a scheduled follow-up with rheumatology on 6/25 but since hospitalized, this will need to be rescheduled Confirmed with patient's daughter that patient is on maintenance prednisone  of 5 Mg daily.  Given possibility of adrenal insufficiency, prednisone  was briefly increased to 10 Mg daily.  Wean back to 5 Mg daily from tomorrow.  Type II DM, controlled A1c 6.5 on 6/18 CBGs well-controlled, discontinued SSI.  Chronic pain syndrome Continue gabapentin   Hypokalemia Replaced.  Generalized weakness/physical deconditioning  Presented initially  recommended home health PT and OT.  However she has since declined and per PT recommendation on 6/26, SNF is recommended.  TOC on board.  Chronic cough Reports that this has been going on for maybe a year and is unchanged. Initiated Tessalon  Perles and Robitussin DM as needed.  Monitor.  Improved cough.  Body mass index is 22.38 kg/m.   DVT prophylaxis: Place and maintain sequential compression device Start: 08/19/23 0910     Code Status: Limited: Do not attempt resuscitation (DNR) -DNR-LIMITED -Do Not Intubate/DNI :  Family Communication: None at bedside. Disposition:  Status is: Inpatient Remains inpatient appropriate because: Agement of hypotension as noted above.  Ongoing IV antibiotics.  SNF at discharge.  Plan for ICD explantation on 7/2.     Consultants:   EP Cardiology Infectious disease Critical care  Procedures:   As noted above  Subjective:  Generalized weakness-chronic symptom for the last 5 days taking care of her.  But denies dizziness, lightheadedness, chest pain or dyspnea.  Objective:   Vitals:   08/19/23 0350 08/19/23 0749 08/19/23 0757 08/19/23 1249  BP: (!) 144/59 (!) 151/66  (!) 146/61  Pulse:  62  66  Resp: 18 16  20   Temp: 97.9 F (36.6 C) 98.7 F (37.1 C)  98.8 F (37.1 C)  TempSrc: Oral Oral  Oral  SpO2: 95%  99% 96%  Weight:      Height:        General exam: Elderly female, moderately built and nourished, chronically ill looking, lying in bed without distress.  Oral mucosa moist Respiratory system: Clear to auscultation.  No increased work of breathing. Cardiovascular system: S1 & S2 heard, RRR. No JVD, murmurs, rubs, gallops or clicks. No pedal edema.  Telemetry personally reviewed: Sinus rhythm.  Unchanged/stable. Gastrointestinal system: Abdomen is nondistended, soft and nontender. No organomegaly or masses felt. Normal bowel sounds heard. Central nervous system: Alert and oriented. No focal neurological deficits. Extremities: Symmetric  5 x 5 power. Skin: No rashes, lesions or ulcers Psychiatry: Judgement and insight appear normal. Mood & affect flat on most days.     Data Reviewed:   I have personally reviewed following labs and imaging studies   CBC: Recent Labs  Lab 08/15/23 1836  WBC 12.4*  HGB 9.8*  HCT 30.4*  MCV 99.0  PLT 179    Basic Metabolic Panel: Recent Labs  Lab 08/15/23 1836 08/16/23 0743 08/17/23 0708 08/18/23 0708 08/19/23 0435  NA 137 138 143 139 138  K 3.8 3.4* 4.0 4.0 4.1  CL 99 103 108 106 104  CO2 23 26 23 25 22   GLUCOSE 240* 86 84 85 83  BUN 23 23 25* 20 18  CREATININE 2.10* 1.94* 1.77* 1.55* 1.41*  CALCIUM  8.8* 8.6* 8.5* 8.3* 8.5*  MG  --   --  1.8  --   --     Liver Function Tests: Recent Labs  Lab 08/13/23 0428 08/14/23 0449  AST 47* 38  ALT 34 29  ALKPHOS 34* 36*  BILITOT 0.5 0.6  PROT 5.8* 6.2*  ALBUMIN 2.4* 2.6*    CBG: Recent Labs  Lab 08/18/23 2106 08/19/23 0747 08/19/23 1226  GLUCAP 109* 86 132*    Microbiology Studies:   No results found for this or any previous visit (from the past 240 hours).   Radiology Studies:  No results found.   Scheduled Meds:    acidophilus  1 capsule Oral TID   atorvastatin   80 mg Oral  QPM   benzonatate   200 mg Oral TID   budesonide -glycopyrrolate -formoterol   2 puff Inhalation BID   Chlorhexidine  Gluconate Cloth  6 each Topical Daily   cholecalciferol   1,000 Units Oral QHS   empagliflozin   10 mg Oral Daily   fluticasone   2 spray Each Nare Daily   folic acid   1 mg Oral Daily   gabapentin   300 mg Oral QHS   insulin  aspart  0-5 Units Subcutaneous QHS   insulin  aspart  0-9 Units Subcutaneous TID WC   nystatin  cream   Topical TID   pantoprazole   40 mg Oral Daily   predniSONE   7.5 mg Oral Q breakfast   sacubitril -valsartan   1 tablet Oral BID   sodium chloride  flush  10-40 mL Intracatheter Q12H    Continuous Infusions:    ampicillin  (OMNIPEN) IV 2 g (08/19/23 1256)   cefTRIAXone  (ROCEPHIN )  IV 2 g  (08/19/23 0819)     LOS: 12 days     Trenda Mar, MD,  FACP, [facility], [facility], [facility]   [facility] [place]      To contact the attending provider between 7A-7P or the covering provider during after hours 7P-7A, please log into the web site [email or web address] and access using universal [place] password for that web site. If you do not have the password, please call the hospital operator.  08/19/2023, 4:03 PM

## 2023-08-19 NOTE — Progress Notes (Signed)
 Chart check: Admitted with CAP blood cultures   Enterococcus faecalis infection  Enterococcal bacteremia 6/19 TTE with no vegetations  6/23 TEE no vegetation on RV lead or valves  Last dose of Xarelto  was 08/18/23 (Hx of DVT/PE, remotely, on life-long a/c) SCD's ordered Last dose of Plavix  08/16/23 (hx of PCI 2022)  Patient is scheduled for device system extraction 08/21/23 w/Dr. Waddell Device system is a: [employer] implanted 08/27/22 Lead is a [id number] Durata implanted same day  Will see with EP MD tomorrow ? If he will need blood/CTS back up given only ~ 75 year old device  Charlies Arthur, PA-C

## 2023-08-20 DIAGNOSIS — I2510 Atherosclerotic heart disease of native coronary artery without angina pectoris: Secondary | ICD-10-CM

## 2023-08-20 DIAGNOSIS — Z95810 Presence of automatic (implantable) cardiac defibrillator: Secondary | ICD-10-CM | POA: Diagnosis not present

## 2023-08-20 DIAGNOSIS — I5022 Chronic systolic (congestive) heart failure: Secondary | ICD-10-CM | POA: Diagnosis not present

## 2023-08-20 DIAGNOSIS — N179 Acute kidney failure, unspecified: Secondary | ICD-10-CM | POA: Diagnosis not present

## 2023-08-20 DIAGNOSIS — Z5181 Encounter for therapeutic drug level monitoring: Secondary | ICD-10-CM | POA: Diagnosis not present

## 2023-08-20 LAB — SURGICAL PCR SCREEN
MRSA, PCR: NEGATIVE
Staphylococcus aureus: NEGATIVE

## 2023-08-20 LAB — CBC
HCT: 29.2 % — ABNORMAL LOW (ref 36.0–46.0)
Hemoglobin: 9.2 g/dL — ABNORMAL LOW (ref 12.0–15.0)
MCH: 32.2 pg (ref 26.0–34.0)
MCHC: 31.5 g/dL (ref 30.0–36.0)
MCV: 102.1 fL — ABNORMAL HIGH (ref 80.0–100.0)
Platelets: 182 10*3/uL (ref 150–400)
RBC: 2.86 MIL/uL — ABNORMAL LOW (ref 3.87–5.11)
RDW: 14.9 % (ref 11.5–15.5)
WBC: 8.9 10*3/uL (ref 4.0–10.5)
nRBC: 0 % (ref 0.0–0.2)

## 2023-08-20 LAB — GLUCOSE, CAPILLARY
Glucose-Capillary: 125 mg/dL — ABNORMAL HIGH (ref 70–99)
Glucose-Capillary: 243 mg/dL — ABNORMAL HIGH (ref 70–99)
Glucose-Capillary: 151 mg/dL — ABNORMAL HIGH (ref 70–99)
Glucose-Capillary: 103 mg/dL — ABNORMAL HIGH (ref 70–99)

## 2023-08-20 MED ORDER — POVIDONE-IODINE 10 % EX SWAB: 2.0000 | Freq: Once | CUTANEOUS | Status: DC

## 2023-08-20 MED ORDER — SODIUM CHLORIDE 0.9 % IV SOLN
80.0000 mg | INTRAVENOUS | Status: AC
  Administered 2023-08-21: 80 mg
  Filled 2023-08-20: qty 2

## 2023-08-20 MED ORDER — CHLORHEXIDINE GLUCONATE 4 % EX SOLN
4.0000 | Freq: Once | CUTANEOUS | Status: AC
  Administered 2023-08-21: 4 via TOPICAL
  Filled 2023-08-20: qty 60
  Filled 2023-08-20: qty 15

## 2023-08-20 MED ORDER — SODIUM CHLORIDE 0.9 % IV SOLN: INTRAVENOUS | Status: DC

## 2023-08-20 MED ORDER — CEFAZOLIN SODIUM-DEXTROSE 2-4 GM/100ML-% IV SOLN
2.0000 g | INTRAVENOUS | Status: AC
  Administered 2023-08-21: 2 g via INTRAVENOUS
  Filled 2023-08-20: qty 100

## 2023-08-20 NOTE — Progress Notes (Addendum)
 Rounding Note   Patient Name: Ebony Scott Date of Encounter: 08/20/2023  [facility] Cardiologist: Shelda Bruckner, MD   Subjective  Sitting up in bed, denies CP, palpitations, feeling better over all  Scheduled Meds:  acidophilus  1 capsule Oral TID   atorvastatin   80 mg Oral QPM   benzonatate   200 mg Oral TID   budesonide -glycopyrrolate -formoterol   2 puff Inhalation BID   Chlorhexidine  Gluconate Cloth  6 each Topical Daily   cholecalciferol   1,000 Units Oral QHS   empagliflozin   10 mg Oral Daily   fluticasone   2 spray Each Nare Daily   folic acid   1 mg Oral Daily   gabapentin   300 mg Oral QHS   insulin  aspart  0-5 Units Subcutaneous QHS   insulin  aspart  0-9 Units Subcutaneous TID WC   nystatin  cream   Topical TID   pantoprazole   40 mg Oral Daily   predniSONE   5 mg Oral Q breakfast   sacubitril -valsartan   1 tablet Oral BID   sodium chloride  flush  10-40 mL Intracatheter Q12H   Continuous Infusions:  ampicillin  (OMNIPEN) IV 2 g (08/20/23 0512)   cefTRIAXone  (ROCEPHIN )  IV 2 g (08/19/23 2106)   PRN Meds: acetaminophen  **OR** acetaminophen , guaiFENesin -dextromethorphan , ipratropium-albuterol , loperamide , ondansetron  **OR** ondansetron  (ZOFRAN ) IV, senna-docusate, sodium chloride  flush   Vital Signs  Vitals:   08/19/23 2313 08/19/23 2346 08/20/23 0405 08/20/23 0759  BP: 133/64 (!) 165/64 129/69 (!) 149/58  Pulse: 63 64 61 65  Resp: 16 18 16 17   Temp: 99.3 F (37.4 C) 98.8 F (37.1 C) 98.6 F (37 C) 98.3 F (36.8 C)  TempSrc: Oral Oral Oral Oral  SpO2: 96% 98% 96% 96%  Weight:      Height:        Intake/Output Summary (Last 24 hours) at 08/20/2023 1002 Last data filed at 08/20/2023 0758 Gross per 24 hour  Intake 360 ml  Output 300 ml  Net 60 ml      08/09/2023   10:54 PM 08/07/2023   11:00 PM 08/02/2023    2:12 PM  Last 3 Weights  Weight (lbs) 122 lb 5.7 oz 130 lb 1.1 oz 130 lb  Weight (kg) 55.5 kg 59 kg 58.968 kg      Telemetry SB/SR  50's > mostly 60's, no pacing - Personally Reviewed  ECG  No new EKGs - Personally Reviewed  Physical Exam  GEN: No acute distress, chronically ill appearing.   Neck: No JVD Cardiac: RRR, no murmurs, rubs, or gallops.  Respiratory: CTA b/l. GI: Soft, nontender, non-distended  MS: No edema; No deformity. Neuro:  Nonfocal  Psych: Normal affect   Labs High Sensitivity Troponin:  No results for input(s): TROPONINIHS in the last 720 hours.   Chemistry Recent Labs  Lab 08/14/23 0449 08/15/23 1836 08/17/23 0708 08/18/23 0708 08/19/23 0435  NA 137   < > 143 139 138  K 3.6   < > 4.0 4.0 4.1  CL 103   < > 108 106 104  CO2 25   < > 23 25 22   GLUCOSE 87   < > 84 85 83  BUN 19   < > 25* 20 18  CREATININE 1.53*   < > 1.77* 1.55* 1.41*  CALCIUM  8.7*   < > 8.5* 8.3* 8.5*  MG  --   --  1.8  --   --   PROT 6.2*  --   --   --   --   ALBUMIN 2.6*  --   --   --   --  AST 38  --   --   --   --   ALT 29  --   --   --   --   ALKPHOS 36*  --   --   --   --   BILITOT 0.6  --   --   --   --   GFRNONAA 35*   < > 30* 35* 39*  ANIONGAP 9   < > 12 8 12    < > = values in this interval not displayed.    Lipids No results for input(s): CHOL, TRIG, HDL, LABVLDL, LDLCALC, CHOLHDL in the last 168 hours.  Hematology Recent Labs  Lab 08/15/23 1836 08/20/23 0704  WBC 12.4* 8.9  RBC 3.07* 2.86*  HGB 9.8* 9.2*  HCT 30.4* 29.2*  MCV 99.0 102.1*  MCH 31.9 32.2  MCHC 32.2 31.5  RDW 15.5 14.9  PLT 179 182   Thyroid  No results for input(s): TSH, FREET4 in the last 168 hours.  BNPNo results for input(s): BNP, PROBNP in the last 168 hours.  DDimer No results for input(s): DDIMER in the last 168 hours.   Radiology  No results found.  Cardiac Studies  08/08/23: TTE 1. Left ventricular ejection fraction, by estimation, is 25 to 30%. The  left ventricle has severely decreased function. The left ventricle  demonstrates regional wall motion abnormalities (see scoring   diagram/findings for description). There is moderate  asymmetric left ventricular hypertrophy of the basal-septal segment. Left  ventricular diastolic parameters are consistent with Grade I diastolic  dysfunction (impaired relaxation).   2. Right ventricular systolic function is normal. The right ventricular  size is normal. There is normal pulmonary artery systolic pressure.   3. Left atrial size was moderately dilated.   4. The mitral valve is normal in structure. No evidence of mitral valve  regurgitation. No evidence of mitral stenosis.   5. The aortic valve is tricuspid. There is mild calcification of the  aortic valve. Aortic valve regurgitation is trivial. No aortic stenosis is  present.   6. The inferior vena cava is normal in size with greater than 50%  respiratory variability, suggesting right atrial pressure of 3 mmHg.   08/12/23: TEE 1. Akinesis of the distal anteroseptal wall and apex with overall severe  LV dysfunction; no vegetations noted on RV lead or valves.   2. Left ventricular ejection fraction, by estimation, is 25 to 30%. The  left ventricle has severely decreased function. The left ventricle  demonstrates regional wall motion abnormalities (see scoring  diagram/findings for description). The left  ventricular internal cavity size was mildly dilated.   3. Right ventricular systolic function is normal. The right ventricular  size is normal.   4. Left atrial size was moderately dilated. No left atrial/left atrial  appendage thrombus was detected.   5. The mitral valve is normal in structure. Trivial mitral valve  regurgitation.   6. The aortic valve is tricuspid. Aortic valve regurgitation is trivial.   7. There is Moderate (Grade III) plaque involving the descending aorta.    12/2020 LHC > severe single vessel CAD with 95 & 90% stenoses of the proximal and mid LAD at major 1st diagonal branch s/p DES PCI of LAD, moderate pulmonary hypertension / pulmonary  venous hypertension  06/2022 ECHO > LVEF 30-35%, elevated LVEDP, RWMA, moderate hypokinesis of the LV mid-apical anteroseptal wall, apical segment & inferoapical segment. There is moderate hypokinesis of the LV globally, G1 DD  Patient Profile   73 y.o. female w/PMHx of  DVT/PE, bronchiectasis, OSA, GERD, CAD s/p PCI in 12/2020, RA on chronic steroids  CKD (IIIb) ICM  Chronic CHF (systolic) ICD  ICD was implanted 08/27/22 Fairly acutely post implant July 2024 febrile illness > w/negative [facility] Oct 2024 admitted with sepsis/CAP  NOW admitted 08/06/23 with severe weakness, fever and chills and was found to have E.Faecalis bacteremia  EP brought on board and planned for ICD system removal Gen cards also on board, with some lower BPs   Assessment & Plan   Bacteremia Enterococcus faecalis infection Enterococcal bacteremia ID team on  board no GI or GU symptoms  - 6/17 UA negative for infection, strep pneumo, Legionella urine antigen negative - 6/19 blood cx NG in 5 days - 6/19 TTE with no vegetations  - 6/23 TEE no vegetation on RV lead or valves PLAN: Ampicillin  8g daily as a continuous infusion + Rocephin  2g IV every 12 hours  Duration: 6 weeks  End Date: 09/19/23  ICD Planned for system removal tomorrow 08/21/23 Remotes with no RV pacing burden No pacing on tele  implanted 08/27/22 Single lead, 7122Q Durata implanted same day --Last dose of Xarelto  was 08/18/23 (Hx of DVT/PE, remotely, on life-long a/c) SCD's ordered --Last dose of Plavix  08/16/23 (hx of PCI 2022) Stable labs Met with patient today, Dr. Nancey has also been bedside Patient remains agreeable to proceed Confirmed with Dr. Waddell No need for CTS back up or blood on hold given <1 year  since implant  Orders are in NPO after MN Patient is DNR, she is aware during her procedure, that she will be full code status and agreeable   Chronic CHF CAD/likely ischemic etiology Appears compensated BPs better Entresto   has been resumed C/w general cardiology team     Further with attending IM/ID teams    For questions or updates, please contact [facility] Please consult [email or web address] for contact info under     Signed, Charlies Macario Arthur, PA-C  08/20/2023, 10:02 AM   '

## 2023-08-20 NOTE — Progress Notes (Addendum)
 PROGRESS NOTE   Ebony Scott  FMW:[id number]    DOB: 15-Aug-1950    DOA: 08/06/2023  PCP: [name], MD   I have briefly reviewed patients previous medical records in [facility].   Brief Hospital Course:   73 y.o. 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  She is admitted for CIED associated E faecalis bacteremia, community acquired pneumonia, acute respiratory failure and AKI.  ID following.  EP plan extraction of ICD on 7/2.   Assessment & Plan:   CIED associated Enterococcus faecalis bacteremia ID consultation and follow-up appreciated. Continuing IV ampicillin  and ceftriaxone  per OPAT protocol.  Post ICD extraction, will need to touch base with ID regarding antibiotic end date. EP follow-up appreciated and plan ICD extraction for 7/2.  6/17: 1 of 2 sets of blood cultures positive for Enterococcus faecalis, sensitive to ampicillin , vancomycin  and gentamicin  synergy Surveillance blood cultures x 2 from 6/19: Negative, final report. TEE 6/23: No thrombus.  No vegetation noted on RV lead.  No valvular vegetations.  Sepsis due to community-acquired pneumonia/recurrent pneumonitis Completed IV antibiotic course for same. Patient reportedly has history of immunosuppression and recurrent pneumonias. Pulmonology evaluated.  Acute kidney injury on stage IIIb chronic kidney disease Baseline creatinine reportedly 1.3-1.5. Had presented with creatinine of 2.14, creatinine had improved but went back again to 2.1 a few days  ago, related to hypotension on 6/26, ATN and meds. Lasix , BiDil , Entresto  were discontinued, creatinine back to recent baseline at 1.41 With rising BP and creatinine improving, Cardiology have resumed Entresto .  Follow BMP tomorrow.  Acute on chronic respiratory failure with hypoxia/bronchiectasis At baseline patient reportedly uses [place] oxygen  2 to 3 L/min at night. Acute respiratory failure has resolved.  May have to do home O2 evaluation at time of discharge.  Ischemic cardiomyopathy/chronic HFrEF/s/p ICD Clinically euvolemic. Last dose of Xarelto  was on 6/29, held off for ICD explantation tomorrow.  Postprocedure to resume Xarelto  when EP okay. Orthostatic on the weekend.  Clinically euvolemic.  Lasix , BiDil  on hold.  Back on Entresto .  CAD s/p DES to LAD Cardiology following.  Continue atorvastatin .  Plavix  discontinued by cardiology 6/27.  Hypotension Due to Lasix  diuresis, was on it daily for several days.  At home she only takes it twice a week.  Also was on BiDil  and Entresto . Got IV fluid bolus couple days back.  For now discontinued Lasix , BiDil . Resolved.  History of PE/DVT Management of Xarelto  as noted above  Hypogammaglobulinemia On IVIG as outpatient. Follows with Dr. Maurilio, Allergist.  Patient has missed a couple doses of the IVIG while hospitalized which she has to get weekly.  As discussed with daughter, has an appointment on Wednesday of next week to see him and can maybe get it at that time.  Seropositive rheumatoid arthritis/chronic steroid use Back on prednisone  5 Mg daily after stress dose steroids early on in admission Had a scheduled follow-up with rheumatology on 6/25 but since hospitalized, this will need to be rescheduled Confirmed with patient's daughter that patient is on maintenance prednisone  of 5 Mg daily.  Given possibility of adrenal  insufficiency, prednisone  was briefly increased to 10 Mg daily.  Weaned back to 5 Mg daily from tomorrow.  Type II DM,  controlled A1c 6.5 on 6/18 CBGs well-controlled, discontinued SSI.  Chronic pain syndrome Continue gabapentin   Hypokalemia Replaced.  Generalized weakness/physical deconditioning Presented initially recommended home health PT and OT.  However she has since declined and per PT recommendation on 6/26, SNF is recommended.  TOC on board.  Chronic cough Reports that this has been going on for maybe a year and is unchanged. Initiated Tessalon  Perles and Robitussin DM as needed.  Monitor.  Improved cough.  Body mass index is 22.38 kg/m.   DVT prophylaxis: Place and maintain sequential compression device Start: 08/19/23 0910     Code Status: Limited: Do not attempt resuscitation (DNR) -DNR-LIMITED -Do Not Intubate/DNI :  Family Communication: Daughter via phone.  Disposition:  Status is: Inpatient Remains inpatient appropriate because: Agement of hypotension as noted above.  Ongoing IV antibiotics.  SNF at discharge.  Plan for ICD explantation on 7/2.     Consultants:   EP Cardiology Infectious disease Critical care  Procedures:   As noted above  Subjective:  Feels well.  Not as weak as several days ago.  No other complaints.  Aware that she has procedure upcoming tomorrow.  States that she was up in the chair for a couple of hours yesterday.  Objective:   Vitals:   08/19/23 2346 08/20/23 0405 08/20/23 0759 08/20/23 1113  BP: (!) 165/64 129/69 (!) 149/58 115/65  Pulse: 64 61 65 76  Resp: 18 16 17 17   Temp: 98.8 F (37.1 C) 98.6 F (37 C) 98.3 F (36.8 C) (!) 97.4 F (36.3 C)  TempSrc: Oral Oral Oral Oral  SpO2: 98% 96% 96% 91%  Weight:      Height:        General exam: Elderly female, moderately built and nourished, chronically ill looking, lying in bed without distress.  Oral mucosa moist Respiratory system: Clear to auscultation.  No increased work of breathing. Cardiovascular system: S1 & S2 heard, RRR. No JVD, murmurs, rubs, gallops or clicks. No pedal edema.   Telemetry personally reviewed: Sinus rhythm. Gastrointestinal system: Abdomen is nondistended, soft and nontender. No organomegaly or masses felt. Normal bowel sounds heard. Central nervous system: Alert and oriented. No focal neurological deficits. Extremities: Symmetric 5 x 5 power. Skin: No rashes, lesions or ulcers Psychiatry: Judgement and insight appear normal. Mood & affect flat on most days.     Data Reviewed:   I have personally reviewed following labs and imaging studies   CBC: Recent Labs  Lab 08/15/23 1836 08/20/23 0704  WBC 12.4* 8.9  HGB 9.8* 9.2*  HCT 30.4* 29.2*  MCV 99.0 102.1*  PLT 179 182    Basic Metabolic Panel: Recent Labs  Lab 08/15/23 1836 08/16/23 0743 08/17/23 0708 08/18/23 0708 08/19/23 0435  NA 137 138 143 139 138  K 3.8 3.4* 4.0 4.0 4.1  CL 99 103 108 106 104  CO2 23 26 23 25 22   GLUCOSE 240* 86 84 85 83  BUN 23 23 25* 20 18  CREATININE 2.10* 1.94* 1.77* 1.55* 1.41*  CALCIUM  8.8* 8.6* 8.5* 8.3* 8.5*  MG  --   --  1.8  --   --     Liver Function Tests: Recent Labs  Lab 08/14/23 0449  AST 38  ALT 29  ALKPHOS 36*  BILITOT 0.6  PROT 6.2*  ALBUMIN 2.6*    CBG: Recent Labs  Lab 08/19/23 2054 08/20/23 0755 08/20/23 1332  GLUCAP 145* 125* 243*    Microbiology Studies:   Recent Results (from the past 240 hours)  Surgical pcr screen     Status: None   Collection Time: 08/20/23 12:01 PM   Specimen: Nasal Mucosa; Nasal Swab  Result Value Ref Range Status   MRSA, PCR NEGATIVE NEGATIVE Final   Staphylococcus aureus NEGATIVE NEGATIVE Final    Comment: (NOTE) The Xpert SA Assay (FDA approved for NASAL specimens in patients 75 years of age and older), is one component of a comprehensive surveillance program. It is not intended to diagnose infection nor to guide or monitor treatment. Performed at [facility], [address] [place], [place] [address]      Radiology Studies:  No results found.   Scheduled Meds:     acidophilus  1 capsule Oral TID   atorvastatin   80 mg Oral QPM   benzonatate   200 mg Oral TID   budesonide -glycopyrrolate -formoterol   2 puff Inhalation BID   chlorhexidine   4 Application Topical Once   Chlorhexidine  Gluconate Cloth  6 each Topical Daily   cholecalciferol   1,000 Units Oral QHS   empagliflozin   10 mg Oral Daily   fluticasone   2 spray Each Nare Daily   folic acid   1 mg Oral Daily   gabapentin   300 mg Oral QHS   gentamicin  (GARAMYCIN ) 80 mg in sodium chloride  0.9 % 500 mL irrigation  80 mg Irrigation On Call   insulin  aspart  0-5 Units Subcutaneous QHS   insulin  aspart  0-9 Units Subcutaneous TID WC   nystatin  cream   Topical TID   pantoprazole   40 mg Oral Daily   povidone-iodine   2 Application Topical Once   predniSONE   5 mg Oral Q breakfast   sacubitril -valsartan   1 tablet Oral BID   sodium chloride  flush  10-40 mL Intracatheter Q12H    Continuous Infusions:    [START ON 08/21/2023] sodium chloride      ampicillin  (OMNIPEN) IV 2 g (08/20/23 1355)    ceFAZolin  (ANCEF ) IV     cefTRIAXone  (ROCEPHIN )  IV 2 g (08/20/23 1148)     LOS: 13 days     Trenda Mar, MD,  FACP, [facility], [facility], [facility]   [facility] [place]      To contact the attending provider between 7A-7P or the covering provider during after hours 7P-7A, please log into the web site [email or web address] and access using universal [place] password for that web site. If you do not have the password, please call the hospital operator.  08/20/2023, 2:26 PM

## 2023-08-20 NOTE — Progress Notes (Signed)
 Physical Therapy Treatment Patient Details Name: Ebony Scott MRN: [id number] DOB: 1951/02/19 Today's Date: 08/20/2023   History of Present Illness Patient is a 73 y.o. female presenting 6/17 with fatigue, AMS, and fever. Work up revealed recurrent community acquired PNA and AKI on CKD III. Hospital course complicated by enterococcal bacteremia, s/p TEE 6/23. Planned extraction of ICD 7/2. PMH includes: anemia, breast cancer, CHF, CAD, fibromyalgia, hypothyroidism, bronchiectasis/asthma with chronic hypoxic respiratory failure on nocturnal oxygen , PNA, PE, sleep apnea.    PT Comments  Pt with improvements from last PT session, BP remained stable with changes in position, and pt able to progress to short distance ambulation (92ft) with RW and minA. She remains limited by poor endurance and stability, is dependent on UE support for balance, but agreeable to gait progression for endurance training. Pt remains motivated to progress mobility for eventual return home. Recommendations remain appropriate, will continue efforts acutely and re-assess after planned ICD removal 7/2.   VITALS:  - supine in bed- BP: 51/68 (63); HR: 63bpm - sitting EOB - BP: 120/55 (75); HR: 72bpm - standing - BP: 117/79 (91); HR: 72bpm - standing after 3 min - BP: 115/65 (80); HR: 75bpm   If plan is discharge home, recommend the following: A little help with walking and/or transfers;A little help with bathing/dressing/bathroom;Assistance with cooking/housework;Assist for transportation;Help with stairs or ramp for entrance   Can travel by private vehicle     Yes  Equipment Recommendations  None recommended by PT    Recommendations for Other Services       Precautions / Restrictions Precautions Precautions: Fall Recall of Precautions/Restrictions: Intact Precaution/Restrictions Comments: oxygen  at night only at baseline, orthostatic at times Restrictions Weight Bearing Restrictions Per Provider Order: No      Mobility  Bed Mobility Overal bed mobility: Modified Independent Bed Mobility: Supine to Sit     Supine to sit: Supervision, HOB elevated, Used rails     General bed mobility comments: increased time, use of bed features    Transfers Overall transfer level: Needs assistance Equipment used: Rolling walker (2 wheels) Transfers: Sit to/from Stand Sit to Stand: Contact guard assist   Step pivot transfers: Contact guard assist       General transfer comment: CGA to rise, dependent on BUE support on bed then RW    Ambulation/Gait Ambulation/Gait assistance: Contact guard assist, Min assist Gait Distance (Feet): 15 Feet (+ 15) Assistive device: Rolling walker (2 wheels) Gait Pattern/deviations: Step-through pattern, Decreased stride length, Trunk flexed Gait velocity: decreased Gait velocity interpretation: <1.31 ft/sec, indicative of household ambulator   General Gait Details: slow, dependent on UE support. VSS, seated rest after 15 ft due to full body exhaustion per pt       Balance Overall balance assessment: Needs assistance Sitting-balance support: No upper extremity supported, Feet unsupported Sitting balance-Leahy Scale: Good     Standing balance support: Bilateral upper extremity supported, During functional activity, Reliant on assistive device for balance Standing balance-Leahy Scale: Fair Standing balance comment: dependent on at least single UE support                            Communication Communication Communication: No apparent difficulties  Cognition Arousal: Alert Behavior During Therapy: WFL for tasks assessed/performed   PT - Cognitive impairments: No apparent impairments                         Following  commands: Intact      Cueing Cueing Techniques: Verbal cues  Exercises      General Comments General comments (skin integrity, edema, etc.): VSS on RA, pt soiled and unaware      Pertinent Vitals/Pain Pain  Assessment Pain Assessment: No/denies pain Pain Score: 0-No pain Pain Intervention(s): Monitored during session     PT Goals (current goals can now be found in the care plan section) Acute Rehab PT Goals Patient Stated Goal: none stated PT Goal Formulation: With patient Time For Goal Achievement: 09/03/23 Potential to Achieve Goals: Good Progress towards PT goals: Progressing toward goals    Frequency    Min 2X/week       AM-PAC PT 6 Clicks Mobility   Outcome Measure  Help needed turning from your back to your side while in a flat bed without using bedrails?: None Help needed moving from lying on your back to sitting on the side of a flat bed without using bedrails?: None Help needed moving to and from a bed to a chair (including a wheelchair)?: A Little Help needed standing up from a chair using your arms (e.g., wheelchair or bedside chair)?: A Little Help needed to walk in hospital room?: A Little Help needed climbing 3-5 steps with a railing? : A Little 6 Click Score: 20    End of Session Equipment Utilized During Treatment: Gait belt Activity Tolerance: Patient tolerated treatment well Patient left: with Scott bell/phone within reach;in chair;with chair alarm set Nurse Communication: Mobility status PT Visit Diagnosis: Difficulty in walking, not elsewhere classified (R26.2);Muscle weakness (generalized) (M62.81)     Time: 1100-1131 PT Time Calculation (min) (ACUTE ONLY): 31 min  Charges:    $Gait Training: 8-22 mins $Therapeutic Exercise: 8-22 mins PT General Charges $$ ACUTE PT VISIT: 1 Visit                     Ebony Scott, PT, DPT   Acute Rehabilitation Department Office [phone] Secure Chat Communication Preferred   Ebony Scott 08/20/2023, 12:04 PM

## 2023-08-20 NOTE — Progress Notes (Signed)
 Cardiology Progress Note  Patient ID: Ebony Scott MRN: [id number] DOB: September 18, 1950 Date of Encounter: 08/20/2023 Primary Cardiologist: Shelda Bruckner, MD  Subjective    BP better no complaints   ROS:  All other ROS reviewed and negative. Pertinent positives noted in the HPI.     Telemetry  Overnight telemetry shows SR 70s, which I personally reviewed.    Physical Exam   Vitals:   08/19/23 2313 08/19/23 2346 08/20/23 0405 08/20/23 0759  BP: 133/64 (!) 165/64 129/69 (!) 149/58  Pulse: 63 64 61 65  Resp: 16 18 16 17   Temp: 99.3 F (37.4 C) 98.8 F (37.1 C) 98.6 F (37 C) 98.3 F (36.8 C)  TempSrc: Oral Oral Oral Oral  SpO2: 96% 98% 96% 96%  Weight:      Height:        Intake/Output Summary (Last 24 hours) at 08/20/2023 0901 Last data filed at 08/20/2023 0758 Gross per 24 hour  Intake 360 ml  Output 300 ml  Net 60 ml       08/09/2023   10:54 PM 08/07/2023   11:00 PM 08/02/2023    2:12 PM  Last 3 Weights  Weight (lbs) 122 lb 5.7 oz 130 lb 1.1 oz 130 lb  Weight (kg) 55.5 kg 59 kg 58.968 kg    Body mass index is 22.38 kg/m.   Affect appropriate Healthy:  appears stated age HEENT: normal Neck supple with no adenopathy JVP normal no bruits no thyromegaly Lungs clear with no wheezing and good diaphragmatic motion Heart:  S1/S2 no murmur, no rub, gallop or click PMI normal Abdomen: benighn, BS positve, no tenderness, no AAA no bruit.  No HSM or HJR Distal pulses intact with no bruits No edema PIC line RUE Neuro non-focal Skin warm and dry No muscular weakness   Cardiac Studies  TTE 08/08/2023  1. Left ventricular ejection fraction, by estimation, is 25 to 30%. The  left ventricle has severely decreased function. The left ventricle  demonstrates regional wall motion abnormalities (see scoring  diagram/findings for description). There is moderate  asymmetric left ventricular hypertrophy of the basal-septal segment. Left  ventricular diastolic parameters are  consistent with Grade I diastolic  dysfunction (impaired relaxation).   2. Right ventricular systolic function is normal. The right ventricular  size is normal. There is normal pulmonary artery systolic pressure.   3. Left atrial size was moderately dilated.   4. The mitral valve is normal in structure. No evidence of mitral valve  regurgitation. No evidence of mitral stenosis.   5. The aortic valve is tricuspid. There is mild calcification of the  aortic valve. Aortic valve regurgitation is trivial. No aortic stenosis is  present.   6. The inferior vena cava is normal in size with greater than 50%  respiratory variability, suggesting right atrial pressure of 3 mmHg.   LHC 01/11/2021 SUMMARY Severe single-vessel CAD with 95% and 90% stenoses of the proximal and mid LAD at major 1st Diag branch.  Likely be culprit for reduced EF and anterior hypokinesis on echo. Successful DES PCI of the LAD using an Onyx Frontier DES 2.25 mm x 34 mm postdilated in tapered fashion from 2.6 to 2.20mm.   TIMI III flow pre and post, reduced to 0% Otherwise minimal CAD in the Right dominant system. 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 Moderate to severely reduced cardiac function with  Cardiac Output-Index of 4.05-2.59 by Fick and 3.75-2.40 by Thermal Dilution.  Patient Profile  Ebony Scott is a 73 y.o. female with systolic heart failure (EF 25-30%), CAD status post PCI to LAD, CKD stage IIIb, DVT/PE, rheumatoid arthritis, bronchiectasis admitted on 08/06/2023 with pneumonia and E faecalis bacteremia.  Assessment & Plan   # Chronic systolic heart failure, EF 25-30% # Suspect ischemic cardiomyopathy - BP more elevated resumed entresto  - She is not on a beta-blocker.  Unclear.  At this time we will hold on this. - escalate GDMT post AICD explant  - ok to continue  jardiance .  - CKD limits aldactone and digoxin. Limited options.   # Bacteremia # ICD with plans for extraction - Device extraction planned for 08/21/2023.  Dr Waddell saw last week - Discussed with EP PA to see patient today to go over risks again  # CAD s/p PCI - can continue xarelto  monotherapy but will need to hold for ICD system removal. Xarelto  held   - DVT/PE distant 2014 and 2019   # CKD 3b - close monitoring of kidney function.  Cr improved 1.41 with K 4.1   # Bacteremia - per hospital team. On ampicillin  and ceftriaxone      For questions or updates, please contact [facility] Please consult [email or web address] for contact info under     Maude Emmer MD [facility]

## 2023-08-20 NOTE — TOC Progression Note (Addendum)
 Transition of Care [facility]) - Progression Note    Patient Details  Name: Ebony Scott MRN: [id number] Date of Birth: 12-Nov-1950  Transition of Care [facility] CM/SW Contact  Isaiah Public, LCSWA Phone Number: 08/20/2023, 1:58 PM  Clinical Narrative:     CSW spoke with Kia with [facility] who inquired about patients IVIG outpatient treatments. CSW spoke with patient and patients daughter Ebony Scott who informed CSW that patient receives treatment at her home. That an infusion RN comes out to patients home once a week, every Tuesday. Patient and daughter ask if her current infusion RN can come out to facility once a week while in short term rehab. Patients daughter informed CSW that patient has medicine at her home and will bring it to the facility. Patients daughter informed CSW that patient does not receive medicine through pic,receives it through her stomach.CSW provided information to Kia with [facility]. Kia  is going to check on this and give CSW a call back to confirm. CSW informed patient and patients daughter Ebony Scott. CSW will continue to follow.  Update- Kia informed CSW that the IVIG infusion cannot  be given at facility or by home health. Facility informed CSW that they can take patient to her outpatient appt to have it given or or it could be held if patient agreeable to that. CSW provided information to patient and patients daughter. Patient and patients daughter okay with that and patients daughter informed CSW that patient has an appointment next Tuesday with [facility]. CSW informed facility. CSW requested for Jon CMA to start insurance authorization for patient.  Expected Discharge Plan: Skilled Nursing Facility Barriers to Discharge: Continued Medical Work up  Expected Discharge Plan and Services In-house Referral: NA Discharge Planning Services: CM Consult Post Acute Care Choice: Home Health, Resumption of Svcs/PTA Provider Living arrangements for the past 2 months: Single  Family Home                 DME Arranged: IV pump/equipment DME Agency:  [facility]) Date DME Agency Contacted: 08/14/23 Time DME Agency Contacted: 1154 Representative spoke with at DME Agency: Pam HH Arranged: RN, Disease Management, PT, OT, Nurse's Aide HH Agency: [facility] Date [facility] Agency Contacted: 08/14/23 Time HH Agency Contacted: 1159 Representative spoke with at [facility] Agency: Arna   Social Determinants of Health (SDOH) Interventions SDOH Screenings   Food Insecurity: No Food Insecurity (08/07/2023)  Housing: Low Risk  (08/07/2023)  Transportation Needs: No Transportation Needs (08/07/2023)  Utilities: Not At Risk (08/07/2023)  Alcohol  Screen: Low Risk  (11/22/2022)  Depression (PHQ2-9): Low Risk  (05/29/2023)  Financial Resource Strain: Low Risk  (03/26/2023)  Physical Activity: Inactive (03/26/2023)  Social Connections: Socially Isolated (08/07/2023)  Stress: Stress Concern Present (03/26/2023)  Tobacco Use: Low Risk  (08/12/2023)  Health Literacy: Adequate Health Literacy (11/22/2022)    Readmission Risk Interventions    08/14/2023   11:49 AM 05/20/2023   12:38 PM 12/06/2022    2:19 PM  Readmission Risk Prevention Plan  Transportation Screening Complete Complete Complete  PCP or Specialist Appt within 3-5 Days   Complete  HRI or Home Care Consult   Complete  Social Work Consult for Recovery Care Planning/Counseling   Complete  Palliative Care Screening   Complete  Medication Review [facility]) Referral to Pharmacy Complete Complete  PCP or Specialist appointment within 3-5 days of discharge  Complete   HRI or Home Care Consult Complete Complete   SW Recovery Care/Counseling Consult Complete Complete  Palliative Care Screening Not Applicable Not Applicable   Skilled Nursing Facility Not Applicable Complete

## 2023-08-20 NOTE — Anesthesia Preprocedure Evaluation (Signed)
 Anesthesia Evaluation  Patient identified by MRN, date of birth, ID band Patient awake    Reviewed: Allergy  & Precautions, H&P , NPO status , Patient's Chart, lab work & pertinent test results  Airway Mallampati: IV  TM Distance: >3 FB Neck ROM: Full    Dental no notable dental hx. (+) Teeth Intact, Dental Advisory Given   Pulmonary asthma , sleep apnea and Continuous Positive Airway Pressure Ventilation    Pulmonary exam normal breath sounds clear to auscultation       Cardiovascular Exercise Tolerance: Good + CAD, +CHF and + DOE   Rhythm:Regular Rate:Normal     Neuro/Psych  Headaches   Depression       GI/Hepatic Neg liver ROS, hiatal hernia,GERD  Medicated,,  Endo/Other  diabetes, Type 2    Renal/GU Renal InsufficiencyRenal disease  negative genitourinary   Musculoskeletal  (+) Arthritis , Rheumatoid disorders,  Fibromyalgia -  Abdominal   Peds  Hematology  (+) Blood dyscrasia, anemia   Anesthesia Other Findings   Reproductive/Obstetrics negative OB ROS                              Anesthesia Physical Anesthesia Plan  ASA: 3  Anesthesia Plan: General   Post-op Pain Management: Tylenol  PO (pre-op)*   Induction: Intravenous  PONV Risk Score and Plan: 3 and Ondansetron , Dexamethasone  and Treatment may vary due to age or medical condition  Airway Management Planned: Oral ETT and Video Laryngoscope Planned  Additional Equipment: Arterial line  Intra-op Plan:   Post-operative Plan: Extubation in OR  Informed Consent: I have reviewed the patients History and Physical, chart, labs and discussed the procedure including the risks, benefits and alternatives for the proposed anesthesia with the patient or authorized representative who has indicated his/her understanding and acceptance.   Patient has DNR.  Discussed DNR with patient and Suspend DNR.   Dental advisory given  Plan  Discussed with: CRNA  Anesthesia Plan Comments:         Anesthesia Quick Evaluation

## 2023-08-21 ENCOUNTER — Encounter (HOSPITAL_COMMUNITY)
Admission: EM | Disposition: A | Discharge status: Skilled Nursing Facility | Payer: Self-pay | Source: Home / Self Care | Attending: Internal Medicine

## 2023-08-21 ENCOUNTER — Encounter (HOSPITAL_COMMUNITY): Payer: Self-pay | Admitting: Certified Registered Nurse Anesthetist

## 2023-08-21 ENCOUNTER — Other Ambulatory Visit: Payer: Self-pay

## 2023-08-21 ENCOUNTER — Inpatient Hospital Stay (HOSPITAL_COMMUNITY)

## 2023-08-21 ENCOUNTER — Encounter: Payer: Self-pay | Admitting: Emergency Medicine

## 2023-08-21 DIAGNOSIS — R7881 Bacteremia: Secondary | ICD-10-CM | POA: Diagnosis not present

## 2023-08-21 DIAGNOSIS — A4181 Sepsis due to Enterococcus: Secondary | ICD-10-CM | POA: Diagnosis not present

## 2023-08-21 DIAGNOSIS — T827XXA Infection and inflammatory reaction due to other cardiac and vascular devices, implants and grafts, initial encounter: Secondary | ICD-10-CM | POA: Diagnosis not present

## 2023-08-21 DIAGNOSIS — E119 Type 2 diabetes mellitus without complications: Secondary | ICD-10-CM

## 2023-08-21 DIAGNOSIS — B952 Enterococcus as the cause of diseases classified elsewhere: Secondary | ICD-10-CM | POA: Diagnosis not present

## 2023-08-21 DIAGNOSIS — F32A Depression, unspecified: Secondary | ICD-10-CM | POA: Diagnosis not present

## 2023-08-21 DIAGNOSIS — I2510 Atherosclerotic heart disease of native coronary artery without angina pectoris: Secondary | ICD-10-CM | POA: Diagnosis not present

## 2023-08-21 DIAGNOSIS — J189 Pneumonia, unspecified organism: Secondary | ICD-10-CM | POA: Diagnosis not present

## 2023-08-21 HISTORY — PX: ICD GENERATOR REMOVAL: EP1232

## 2023-08-21 HISTORY — PX: LEAD EXTRACTION: EP1211

## 2023-08-21 LAB — ECHO INTRAOPERATIVE TEE
Height: 62 in
Weight: 1957.68 [oz_av]

## 2023-08-21 LAB — CBC
HCT: 28.9 % — ABNORMAL LOW (ref 36.0–46.0)
HCT: 27.8 % — ABNORMAL LOW (ref 36.0–46.0)
Hemoglobin: 9.1 g/dL — ABNORMAL LOW (ref 12.0–15.0)
Hemoglobin: 8.6 g/dL — ABNORMAL LOW (ref 12.0–15.0)
MCH: 31.7 pg (ref 26.0–34.0)
MCH: 31.5 pg (ref 26.0–34.0)
MCHC: 31.5 g/dL (ref 30.0–36.0)
MCHC: 30.9 g/dL (ref 30.0–36.0)
MCV: 100.7 fL — ABNORMAL HIGH (ref 80.0–100.0)
MCV: 101.8 fL — ABNORMAL HIGH (ref 80.0–100.0)
Platelets: 187 10*3/uL (ref 150–400)
Platelets: 166 10*3/uL (ref 150–400)
RBC: 2.87 MIL/uL — ABNORMAL LOW (ref 3.87–5.11)
RBC: 2.73 MIL/uL — ABNORMAL LOW (ref 3.87–5.11)
RDW: 15.1 % (ref 11.5–15.5)
RDW: 15.4 % (ref 11.5–15.5)
WBC: 9.3 10*3/uL (ref 4.0–10.5)
WBC: 8.7 10*3/uL (ref 4.0–10.5)
nRBC: 0 % (ref 0.0–0.2)
nRBC: 0 % (ref 0.0–0.2)

## 2023-08-21 LAB — BASIC METABOLIC PANEL WITH GFR
Anion gap: 10 (ref 5–15)
BUN: 20 mg/dL (ref 8–23)
CO2: 23 mmol/L (ref 22–32)
Calcium: 8.5 mg/dL — ABNORMAL LOW (ref 8.9–10.3)
Chloride: 105 mmol/L (ref 98–111)
Creatinine, Ser: 1.45 mg/dL — ABNORMAL HIGH (ref 0.44–1.00)
GFR, Estimated: 38 mL/min — ABNORMAL LOW (ref >60)
Glucose, Bld: 88 mg/dL (ref 70–99)
Potassium: 4.1 mmol/L (ref 3.5–5.1)
Sodium: 138 mmol/L (ref 135–145)

## 2023-08-21 LAB — GLUCOSE, CAPILLARY
Glucose-Capillary: 92 mg/dL (ref 70–99)
Glucose-Capillary: 211 mg/dL — ABNORMAL HIGH (ref 70–99)
Glucose-Capillary: 162 mg/dL — ABNORMAL HIGH (ref 70–99)

## 2023-08-21 LAB — TYPE AND SCREEN
ABO/RH(D): A POS
Antibody Screen: NEGATIVE

## 2023-08-21 MED ORDER — ONDANSETRON HCL 4 MG/2ML IJ SOLN
INTRAMUSCULAR | Status: DC | PRN
  Administered 2023-08-21: 4 mg via INTRAVENOUS

## 2023-08-21 MED ORDER — METOPROLOL TARTRATE 5 MG/5ML IV SOLN: 5.0000 mg | INTRAVENOUS | Status: DC | PRN

## 2023-08-21 MED ORDER — CEFAZOLIN SODIUM-DEXTROSE 2-4 GM/100ML-% IV SOLN
INTRAVENOUS | Status: AC
  Filled 2023-08-21: qty 100

## 2023-08-21 MED ORDER — ROCURONIUM BROMIDE 10 MG/ML (PF) SYRINGE
PREFILLED_SYRINGE | INTRAVENOUS | Status: DC | PRN
  Administered 2023-08-21: 60 mg via INTRAVENOUS

## 2023-08-21 MED ORDER — LACTATED RINGERS IV SOLN
INTRAVENOUS | Status: DC | PRN
Start: 2023-08-21 — End: 2023-08-21

## 2023-08-21 MED ORDER — ACETAMINOPHEN 325 MG PO TABS
ORAL_TABLET | ORAL | Status: AC
  Filled 2023-08-21: qty 2

## 2023-08-21 MED ORDER — PHENYLEPHRINE HCL-NACL 20-0.9 MG/250ML-% IV SOLN
INTRAVENOUS | Status: DC | PRN
  Administered 2023-08-21: 25 ug/min via INTRAVENOUS

## 2023-08-21 MED ORDER — HYDRALAZINE HCL 20 MG/ML IJ SOLN: 10.0000 mg | INTRAMUSCULAR | Status: DC | PRN

## 2023-08-21 MED ORDER — SODIUM CHLORIDE 0.9 % IV SOLN
INTRAVENOUS | Status: AC
  Filled 2023-08-21: qty 2

## 2023-08-21 MED ORDER — ETOMIDATE 2 MG/ML IV SOLN
INTRAVENOUS | Status: DC | PRN
Start: 2023-08-21 — End: 2023-08-21
  Administered 2023-08-21: 12 mg via INTRAVENOUS

## 2023-08-21 MED ORDER — SUGAMMADEX SODIUM 200 MG/2ML IV SOLN
INTRAVENOUS | Status: DC | PRN
  Administered 2023-08-21: 200 mg via INTRAVENOUS

## 2023-08-21 MED ORDER — FENTANYL CITRATE (PF) 250 MCG/5ML IJ SOLN
INTRAMUSCULAR | Status: DC | PRN
Start: 2023-08-21 — End: 2023-08-21
  Administered 2023-08-21 (×2): 50 ug via INTRAVENOUS

## 2023-08-21 MED ORDER — FENTANYL CITRATE (PF) 100 MCG/2ML IJ SOLN
INTRAMUSCULAR | Status: AC
Start: 2023-08-21 — End: 2023-08-21
  Filled 2023-08-21: qty 2

## 2023-08-21 MED ORDER — NOREPINEPHRINE 4 MG/250ML-% IV SOLN
INTRAVENOUS | Status: DC | PRN
  Administered 2023-08-21: 2 ug/min via INTRAVENOUS

## 2023-08-21 MED ORDER — PROPOFOL 10 MG/ML IV BOLUS
INTRAVENOUS | Status: DC | PRN
  Administered 2023-08-21 (×2): 20 mg via INTRAVENOUS

## 2023-08-21 NOTE — Anesthesia Procedure Notes (Signed)
 Procedure Name: Intubation Date/Time: 08/21/2023 8:33 AM  Performed by: Vertie Russell NOVAK, CRNAPre-anesthesia Checklist: Patient identified, Emergency Drugs available, Suction available, Patient being monitored and Timeout performed Patient Re-evaluated:Patient Re-evaluated prior to induction Oxygen  Delivery Method: Circle system utilized Preoxygenation: Pre-oxygenation with 100% oxygen  Induction Type: IV induction Ventilation: Mask ventilation without difficulty Laryngoscope Size: Glidescope and 4 Grade View: Grade II Tube type: Oral Tube size: 7.0 mm Number of attempts: 1 Airway Equipment and Method: Stylet and Video-laryngoscopy Placement Confirmation: ETT inserted through vocal cords under direct vision, positive ETCO2 and breath sounds checked- equal and bilateral Secured at: 20 cm Tube secured with: Tape Dental Injury: Teeth and Oropharynx as per pre-operative assessment

## 2023-08-21 NOTE — Anesthesia Procedure Notes (Signed)
Arterial Line Insertion Performed by: Jakwon Gayton B, CRNA, CRNA  Patient location: Pre-op. Preanesthetic checklist: patient identified, IV checked, site marked, risks and benefits discussed, surgical consent, monitors and equipment checked, pre-op evaluation, timeout performed and anesthesia consent Lidocaine 1% used for infiltration Left, radial was placed Catheter size: 20 G Hand hygiene performed , maximum sterile barriers used  and Seldinger technique used Allen's test indicative of satisfactory collateral circulation Attempts: 1 Procedure performed without using ultrasound guided technique. Following insertion, dressing applied and Biopatch. Post procedure assessment: normal  Patient tolerated the procedure well with no immediate complications.    

## 2023-08-21 NOTE — TOC Progression Note (Signed)
 Transition of Care [facility]) - Progression Note    Patient Details  Name: Ebony Scott MRN: [id number] Date of Birth: Feb 21, 1950  [facility] of [facility] CM/SW Contact  Isaiah Public, LCSWA Phone Number: 08/21/2023, 11:08 AM  Clinical Narrative:     Patients insurance authorization has been approved. Auth ID# [id number]. Insurance authorization approved from 7/3-7/7. Kia with [facility] confirmed facility can accept tomorrow if medically stable. CSW will continue to follow.  Expected Discharge Plan: Skilled Nursing Facility Barriers to Discharge: Continued Medical Work up  Expected Discharge Plan and Services In-house Referral: NA Discharge Planning Services: CM Consult Post Acute Care Choice: Home Health, Resumption of Svcs/PTA Provider Living arrangements for the past 2 months: Single Family Home                 DME Arranged: IV pump/equipment DME Agency:  [facility]) Date DME Agency Contacted: 08/14/23 Time DME Agency Contacted: 1154 Representative spoke with at DME Agency: Pam HH Arranged: RN, Disease Management, PT, OT, Nurse's Aide HH Agency: [facility] Date [facility] Agency Contacted: 08/14/23 Time HH Agency Contacted: 1159 Representative spoke with at [facility] Agency: Arna   Social Determinants of Health (SDOH) Interventions SDOH Screenings   Food Insecurity: No Food Insecurity (08/07/2023)  Housing: Low Risk  (08/07/2023)  Transportation Needs: No Transportation Needs (08/07/2023)  Utilities: Not At Risk (08/07/2023)  Alcohol  Screen: Low Risk  (11/22/2022)  Depression (PHQ2-9): Low Risk  (05/29/2023)  Financial Resource Strain: Low Risk  (03/26/2023)  Physical Activity: Inactive (03/26/2023)  Social Connections: Socially Isolated (08/07/2023)  Stress: Stress Concern Present (03/26/2023)  Tobacco Use: Low Risk  (08/12/2023)  Health Literacy: Adequate Health Literacy (11/22/2022)    Readmission Risk Interventions    08/14/2023   11:49 AM 05/20/2023   12:38 PM 12/06/2022    2:19 PM   Readmission Risk Prevention Plan  Transportation Screening Complete Complete Complete  PCP or Specialist Appt within 3-5 Days   Complete  HRI or Home Care Consult   Complete  Social Work Consult for Recovery Care Planning/Counseling   Complete  Palliative Care Screening   Complete  Medication Review [facility]) Referral to Pharmacy Complete Complete  PCP or Specialist appointment within 3-5 days of discharge  Complete   HRI or Home Care Consult Complete Complete   SW Recovery Care/Counseling Consult Complete Complete   Palliative Care Screening Not Applicable Not Applicable   Skilled Nursing Facility Not Applicable Complete

## 2023-08-21 NOTE — Discharge Instructions (Signed)
 Implantable Cardiac Device Extraction, Care After  This sheet gives you information about how to care for yourself after your procedure. Your health care provider may also give you more specific instructions. If you have problems or questions, contact your health care provider.  What can I expect after the procedure? After your procedure, it is common to have: Pain or soreness at the site where the cardiac device was removed. Mild Swelling at the site where the cardiac device was inserted.  Follow these instructions at home: Incision care  Keep the incision clean and dry. Do not take baths, swim, or use a hot tub until after your wound check.  Do not shower for at least 7 days, or as directed by your health care provider. Pat the area dry with a clean towel. Do not rub the area. This may cause bleeding. Follow instructions from your health care provider about how to take care of your incision. Make sure you: Leave stitches (sutures), skin glue, or adhesive strips in place. These skin closures may need to stay in place for 2 weeks or longer. If adhesive strip edges start to loosen and curl up, you may trim the loose edges. Do not remove adhesive strips completely unless your health care provider tells you to do that. Check around your incision area every day for signs of infection. Check for: More redness, swelling, or pain. More fluid or blood. Warmth. Pus or a bad smell. Activity Do not lift anything that is heavier than 10 lb (4.5 kg) until your health care provider says it is okay to do so. For the first week, or as long as told by your health care provider: Avoid lifting your affected arm higher than your shoulder. Avoid strenuous exercise. Ask your health care provider when it is okay to: Resume your normal activities. Return to work or school. Resume sexual activity. Contact a health care provider if: You have any of these around your incision site or coming from it: More  redness, swelling, or pain. Fluid or blood. Warmth to the touch. Pus or a bad smell. You have a fever. Get help right away if: You experience chest pain that is different from the pain at the cardiac device site. You develop a red streak that extends above or below the incision site. You experience shortness of breath. You have light-headedness that does not go away quickly. You faint or have dizzy spells. Your pulse suddenly drops or increases rapidly and does not return to normal. You begin to gain weight and your legs and ankles swell. Summary After your procedure, it is common to have pain, soreness, and some swelling where the cardiac device was removed. Make sure to keep your incision clean and dry. Follow instructions from your health care provider about how to take care of your incision. Check your incision every day for signs of infection, such as more pain or swelling, pus or a bad smell, warmth, or leaking fluid and blood. Avoid strenuous exercise and lifting your left arm higher than your shoulder for 2 weeks, or as long as told by your health care provider. This information is not intended to replace advice given to you by your health care provider. Make sure you discuss any questions you have with your health care provider.

## 2023-08-21 NOTE — H&P (Signed)
 Progress Note  Patient Name: Ebony Scott Date of Encounter: 08/21/2023  Primary Cardiologist: Shelda Bruckner, MD   Subjective   Stable overnight. No chest pain.   Inpatient Medications    Scheduled Meds:  [MAR Hold] acidophilus  1 capsule Oral TID   [MAR Hold] atorvastatin   80 mg Oral QPM   [MAR Hold] benzonatate   200 mg Oral TID   [MAR Hold] budesonide -glycopyrrolate -formoterol   2 puff Inhalation BID   [MAR Hold] Chlorhexidine  Gluconate Cloth  6 each Topical Daily   [MAR Hold] cholecalciferol   1,000 Units Oral QHS   [MAR Hold] empagliflozin   10 mg Oral Daily   [MAR Hold] fluticasone   2 spray Each Nare Daily   [MAR Hold] folic acid   1 mg Oral Daily   [MAR Hold] gabapentin   300 mg Oral QHS   gentamicin  (GARAMYCIN ) 80 mg in sodium chloride  0.9 % 500 mL irrigation  80 mg Irrigation On Call   [MAR Hold] insulin  aspart  0-5 Units Subcutaneous QHS   [MAR Hold] insulin  aspart  0-9 Units Subcutaneous TID WC   [MAR Hold] nystatin  cream   Topical TID   [MAR Hold] pantoprazole   40 mg Oral Daily   povidone-iodine   2 Application Topical Once   [MAR Hold] predniSONE   5 mg Oral Q breakfast   [MAR Hold] sacubitril -valsartan   1 tablet Oral BID   [MAR Hold] sodium chloride  flush  10-40 mL Intracatheter Q12H   Continuous Infusions:  sodium chloride  50 mL/hr at 08/21/23 0632   [MAR Hold] ampicillin  (OMNIPEN) IV 2 g (08/21/23 0559)    ceFAZolin  (ANCEF ) IV     [MAR Hold] cefTRIAXone  (ROCEPHIN )  IV 2 g (08/20/23 2328)   PRN Meds: [MAR Hold] acetaminophen  **OR** [MAR Hold] acetaminophen , [MAR Hold] guaiFENesin -dextromethorphan , [MAR Hold] ipratropium-albuterol , [MAR Hold] loperamide , [MAR Hold] ondansetron  **OR** [MAR Hold] ondansetron  (ZOFRAN ) IV, [MAR Hold] senna-docusate, [MAR Hold] sodium chloride  flush   Vital Signs    Vitals:   08/20/23 1927 08/20/23 2201 08/21/23 0015 08/21/23 0419  BP: (!) 105/59  138/60 122/68  Pulse: 65  66 (!) 57  Resp: 16  20 18   Temp: 98.5 F (36.9  C)  98.8 F (37.1 C) 99.3 F (37.4 C)  TempSrc: Oral  Oral Oral  SpO2: 97% 96% 99% 98%  Weight:      Height:        Intake/Output Summary (Last 24 hours) at 08/21/2023 0736 Last data filed at 08/20/2023 2328 Gross per 24 hour  Intake 1200 ml  Output --  Net 1200 ml   Filed Weights   08/07/23 2300 08/09/23 2254  Weight: 59 kg 55.5 kg    Telemetry    nsr - Personally Reviewed  ECG    none - Personally Reviewed  Physical Exam   GEN: No acute distress.   Neck: No JVD Cardiac: RRR, no murmurs, rubs, or gallops.  Respiratory: Clear to auscultation bilaterally. GI: Soft, nontender, non-distended  MS: No edema; No deformity. Neuro:  Nonfocal  Psych: Normal affect   Labs    Chemistry Recent Labs  Lab 08/18/23 0708 08/19/23 0435 08/21/23 0500  NA 139 138 138  K 4.0 4.1 4.1  CL 106 104 105  CO2 25 22 23   GLUCOSE 85 83 88  BUN 20 18 20   CREATININE 1.55* 1.41* 1.45*  CALCIUM  8.3* 8.5* 8.5*  GFRNONAA 35* 39* 38*  ANIONGAP 8 12 10      Hematology Recent Labs  Lab 08/15/23 1836 08/20/23 0704 08/21/23 0500  WBC 12.4*  8.9 9.3  RBC 3.07* 2.86* 2.87*  HGB 9.8* 9.2* 9.1*  HCT 30.4* 29.2* 28.9*  MCV 99.0 102.1* 100.7*  MCH 31.9 32.2 31.7  MCHC 32.2 31.5 31.5  RDW 15.5 14.9 15.1  PLT 179 182 187    Cardiac EnzymesNo results for input(s): TROPONINI in the last 168 hours. No results for input(s): TROPIPOC in the last 168 hours.   BNPNo results for input(s): BNP, PROBNP in the last 168 hours.   DDimer No results for input(s): DDIMER in the last 168 hours.   Radiology    No results found.  Cardiac Studies   none  Patient Profile     73 y.o. female admitted with enterococcal sepsis for ICD system extraction  Assessment & Plan    Enterococcal bacteremia/sepsis - she is much improved with IV anti-biotics.She will undergo ICD system removal. I have reviewed the indications/risks/benefits/and she wishes to proceed.    For questions or updates,  please contact [facility] Please consult [email or web address] for contact info under Cardiology/STEMI.      Signed, Danelle Birmingham, MD  08/21/2023, 7:36 AM

## 2023-08-21 NOTE — Progress Notes (Signed)
 OT Cancellation Note  Patient Details Name: Ebony Scott MRN: [id number] DOB: 24-Jul-1950   Cancelled Treatment:    Reason Eval/Treat Not Completed: Patient at procedure or test/ unavailable. Pt off unit for scheduled procedure. Will return post-op.   Taralyn Ferraiolo C, OT  Acute Rehabilitation Services Office [phone] Secure chat preferred   Adrianne GORMAN Savers 08/21/2023, 7:27 AM

## 2023-08-21 NOTE — Transfer of Care (Signed)
 Immediate Anesthesia Transfer of Care Note  Patient: Ebony Scott  Procedure(s) Performed: LEAD EXTRACTION ICD GENERATOR REMOVAL  Patient Location: PACU and Cath Lab  Anesthesia Type:General  Level of Consciousness: awake, alert , and oriented  Airway & Oxygen  Therapy: Patient Spontanous Breathing and Patient connected to nasal cannula oxygen   Post-op Assessment: Report given to RN and Post -op Vital signs reviewed and stable  Post vital signs: Reviewed and stable  Last Vitals:  Vitals Value Taken Time  BP    Temp    Pulse 68 08/21/23 09:50  Resp 16 08/21/23 09:50  SpO2 99 % 08/21/23 09:50  Vitals shown include unfiled device data.  Last Pain:  Vitals:   08/21/23 0419  TempSrc: Oral  PainSc:          Complications: No notable events documented.

## 2023-08-21 NOTE — Progress Notes (Signed)
  Echocardiogram Echocardiogram Transesophageal has been performed.  Ebony Scott 08/21/2023, 4:54 PM

## 2023-08-21 NOTE — Progress Notes (Signed)
 Site Area: left radial arterial line  Site prior to removal: level 0   Pressure applied for: 5 minutes   Manual: yes   Patient status during pull: stable  Post pull radial site: level 0  Post pull instructions given: yes  Post pull pulses present: +2 radial pulse on left   Dressing applied: gauze and tegaderm  Comments:

## 2023-08-21 NOTE — Progress Notes (Signed)
 PROGRESS NOTE    Ebony Scott  FMW:[id number] DOB: September 12, 1950 DOA: 08/06/2023 PCP: Ebony Butler DASEN, MD    Brief Narrative:    73 y.o. 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  She is admitted for CIED associated E faecalis bacteremia, community acquired pneumonia, acute respiratory failure and AKI.  ID following.  EP plan extraction of ICD on 7/2.   Assessment & Plan:  Principal Problem:   Community acquired pneumonia Active Problems:   Chronic systolic heart failure (HCC)   Generalized weakness   AKI (acute kidney injury) (HCC)   Enterococcus faecalis infection   Enterococcal bacteremia    CIED associated Enterococcus faecalis bacteremia ID is currently following.  ICD extraction is planned per EP team - Will need antibiotics ampicillin  8 g continuous infusion plus Rocephin  2 g every 12 hours until 09/19/2023.  Initial blood culture 6/17-positive for Enterococcus faecalis Repeat blood culture 6/19-NGTD TEE 6/23-no thrombus or vegetation seen    Sepsis due to community-acquired pneumonia/recurrent pneumonitis Currently on antibiotic therapy   Acute kidney injury on stage IIIb chronic kidney disease Baseline creatinine reportedly 1.3-1.5.  Creatinine peaked 2.10, now back to baseline - Continue Jardiance , Entresto    Acute on chronic respiratory failure with hypoxia/bronchiectasis At baseline patient reportedly uses [place] oxygen  2 to 3 L/min at night. Acute respiratory failure has resolved.  May have to do home O2  evaluation at time of discharge.   Ischemic cardiomyopathy/chronic HFrEF/s/p ICD Overall appears euvolemic.  Lasix  and BiDil  are currently on hold Continue Entresto  and Jardiance    CAD s/p DES to LAD Lipitor..  Plavix  discontinued by cardiology 6/27.   History of PE/DVT Management of Xarelto  as noted above   Hypogammaglobulinemia On IVIG as outpatient. Follows with Ebony Scott, Allergist.  Patient has missed a couple doses of the IVIG while hospitalized which she has to get weekly.  As discussed with daughter, has an appointment on Wednesday of next week to see him and can maybe get it at that time.   Seropositive rheumatoid arthritis/chronic steroid use Back on prednisone  5 Mg daily.  Follow-up outpatient rheumatology   Type II DM, controlled A1c 6.5 on 6/18 CBGs well-controlled, discontinued SSI.   Chronic pain syndrome Continue gabapentin    Hypokalemia Replaced.   Generalized weakness/physical deconditioning PT/OT   Chronic cough Reports that this has been going on for maybe a year and is unchanged. Initiated Tessalon  Perles and Robitussin DM as needed.  Monitor.  Improved cough.   Body mass index is 22.38 kg/m.     DVT prophylaxis: Place and maintain sequential compression device Start: 08/19/23 0910     Code Status: Limited: Do not attempt resuscitation (DNR) -DNR-LIMITED -Do Not Intubate/DNI :  Family Communication: Daughter via phone.  Disposition:  Status is: Inpatient Remains inpatient appropriate because: Definitive plans after ICD extraction     DVT prophylaxis: Place and maintain sequential compression device Start: 08/19/23 0910      Code Status: Limited: Do not attempt resuscitation (DNR) -DNR-LIMITED -Do Not Intubate/DNI  Family Communication:   Status is: Inpatient Remains inpatient appropriate because: Will need SNF placement once  cleared by cardiology    Subjective:  Seen at bedside after ICD extraction. No  complaints  Examination:  General exam: Appears calm and comfortable  Respiratory system: Clear to auscultation. Respiratory effort normal. Cardiovascular system: S1 & S2 heard, RRR. No JVD, murmurs, rubs, gallops or clicks. No pedal edema. Gastrointestinal system: Abdomen is nondistended, soft and nontender. No organomegaly or masses felt. Normal bowel sounds heard. Central nervous system: Alert and oriented. No focal neurological deficits. Extremities: Symmetric 5 x 5 power. Skin: No rashes, lesions or ulcers Psychiatry: Judgement and insight appear normal. Mood & affect appropriate.                Diet Orders (From admission, onward)     Start     Ordered   08/21/23 1118  Diet Heart Room service appropriate? Yes; Fluid consistency: Thin  Diet effective now       Question Answer Comment  Room service appropriate? Yes   Fluid consistency: Thin      08/21/23 1117            Objective: Vitals:   08/21/23 1028 08/21/23 1045 08/21/23 1100 08/21/23 1142  BP: 137/72 (!) 137/92 125/74 132/64  Pulse: 71 76 73 69  Resp: 13 15 17 16   Temp: 97.9 F (36.6 C)   97.9 F (36.6 C)  TempSrc: Oral   Oral  SpO2: 95% 93% 95% 97%  Weight:      Height:        Intake/Output Summary (Last 24 hours) at 08/21/2023 1350 Last data filed at 08/21/2023 1100 Gross per 24 hour  Intake 1345 ml  Output 275 ml  Net 1070 ml   Filed Weights   08/07/23 2300 08/09/23 2254  Weight: 59 kg 55.5 kg    Scheduled Meds:  acetaminophen        acidophilus  1 capsule Oral TID   atorvastatin   80 mg Oral QPM   benzonatate   200 mg Oral TID   budesonide -glycopyrrolate -formoterol   2 puff Inhalation BID   Chlorhexidine  Gluconate Cloth  6 each Topical Daily   cholecalciferol   1,000 Units Oral QHS   empagliflozin   10 mg Oral Daily   fluticasone   2 spray Each Nare Daily   folic acid   1 mg Oral Daily   gabapentin   300 mg Oral QHS   insulin  aspart  0-5 Units Subcutaneous QHS   insulin  aspart  0-9  Units Subcutaneous TID WC   nystatin  cream   Topical TID   pantoprazole   40 mg Oral Daily   predniSONE   5 mg Oral Q breakfast   sacubitril -valsartan   1 tablet Oral BID   sodium chloride  0.9 % with gentamicin  (GARAMYCIN ) ADS Med       sodium chloride  flush  10-40 mL Intracatheter Q12H   Continuous Infusions:  ampicillin  (OMNIPEN) IV 2 g (08/21/23 0559)   cefTRIAXone  (ROCEPHIN )  IV 2 g (08/21/23 1133)    Nutritional status     Body mass index is 22.38 kg/m.  Data Reviewed:   CBC: Recent Labs  Lab 08/15/23 1836 08/20/23 0704 08/21/23 0500  WBC 12.4* 8.9 9.3  HGB 9.8* 9.2* 9.1*  HCT 30.4* 29.2* 28.9*  MCV 99.0 102.1* 100.7*  PLT 179 182 187   Basic Metabolic Panel: Recent Labs  Lab 08/16/23 0743 08/17/23 0708 08/18/23 0708 08/19/23 0435 08/21/23 0500  NA 138 143 139 138 138  K 3.4* 4.0 4.0 4.1 4.1  CL 103 108 106 104 105  CO2 26 23 25 22  23  GLUCOSE 86 84 85 83 88  BUN 23 25* 20 18 20   CREATININE 1.94* 1.77* 1.55* 1.41* 1.45*  CALCIUM  8.6* 8.5* 8.3* 8.5* 8.5*  MG  --  1.8  --   --   --    GFR: Estimated Creatinine Clearance: 26.5 mL/min (A) (by C-G formula based on SCr of 1.45 mg/dL (H)). Liver Function Tests: No results for input(s): AST, ALT, ALKPHOS, BILITOT, PROT, ALBUMIN in the last 168 hours. No results for input(s): LIPASE, AMYLASE in the last 168 hours. No results for input(s): AMMONIA in the last 168 hours. Coagulation Profile: No results for input(s): INR, PROTIME in the last 168 hours. Cardiac Enzymes: No results for input(s): CKTOTAL, CKMB, CKMBINDEX, TROPONINI in the last 168 hours. BNP (last 3 results) No results for input(s): PROBNP in the last 8760 hours. HbA1C: No results for input(s): HGBA1C in the last 72 hours. CBG: Recent Labs  Lab 08/20/23 0755 08/20/23 1332 08/20/23 1658 08/20/23 2122 08/21/23 1148  GLUCAP 125* 243* 151* 103* 92   Lipid Profile: No results for input(s): CHOL, HDL,  LDLCALC, TRIG, CHOLHDL, LDLDIRECT in the last 72 hours. Thyroid  Function Tests: No results for input(s): TSH, T4TOTAL, FREET4, T3FREE, THYROIDAB in the last 72 hours. Anemia Panel: No results for input(s): VITAMINB12, FOLATE, FERRITIN, TIBC, IRON , RETICCTPCT in the last 72 hours. Sepsis Labs: No results for input(s): PROCALCITON, LATICACIDVEN in the last 168 hours.  Recent Results (from the past 240 hours)  Surgical pcr screen     Status: None   Collection Time: 08/20/23 12:01 PM   Specimen: Nasal Mucosa; Nasal Swab  Result Value Ref Range Status   MRSA, PCR NEGATIVE NEGATIVE Final   Staphylococcus aureus NEGATIVE NEGATIVE Final    Comment: (NOTE) The Xpert SA Assay (FDA approved for NASAL specimens in patients 58 years of age and older), is one component of a comprehensive surveillance program. It is not intended to diagnose infection nor to guide or monitor treatment. Performed at [facility], [address], [place], [place] [address]          Radiology Studies: ECHO INTRAOPERATIVE TEE Result Date: 08/21/2023  *INTRAOPERATIVE TRANSESOPHAGEAL REPORT *  Patient Name:   Ebony Scott   Date of Exam: 08/21/2023 Medical Rec #:  [id number]      Height:       62.0 in Accession #:    [id number]     Weight:       122.4 lb Date of Birth:  [birth year]       BSA:          1.55 m Patient Age:    73 years       BP:           137/72 mmHg Patient Gender: F              HR:           71 bpm. Exam Location:  Anesthesiology Transesophogeal exam was perform intraoperatively during surgical procedure. Patient was closely monitored under general anesthesia during the entirety of examination. Indications:     Lead Extraction Sonographer:     Tinnie Barefoot RDCS Performing Phys: [id number] T TAYLOR Diagnosing Phys: Elsie Needle MD Complications: No known complications during this procedure. POST-OP IMPRESSIONS Overall, there were no significant changes from  pre-bypass. The pericerdial effusion remains trivial and the TR may be slightly increased, but not significantly. PRE-OP FINDINGS  Left Ventricle: The left ventricle has severely reduced systolic function, with an  ejection fraction of 20%. The cavity size was moderately dilated. Left ventricular diffuse hypokinesis. Right Ventricle: The right ventricle has mildly reduced systolic function. The cavity was normal. There is no increase in right ventricular wall thickness. Left Atrium: Left atrial size was not assessed. No left atrial/left atrial appendage thrombus was detected. Right Atrium: Right atrial size was not assessed. Interatrial Septum: No atrial level shunt detected by color flow Doppler. Pericardium: Trivial pericardial effusion is present. The pericardial effusion is localized near the right ventricle. Mitral Valve: The mitral valve is normal in structure. Mitral valve regurgitation is trivial by color flow Doppler. Tricuspid Valve: The tricuspid valve was normal in structure. Tricuspid valve regurgitation is mild by color flow Doppler. Aortic Valve: The aortic valve is tricuspid Aortic valve regurgitation is trivial by color flow Doppler. Pulmonic Valve: The pulmonic valve was normal in structure. Pulmonic valve regurgitation is not visualized by color flow Doppler.  Elsie Needle MD Electronically signed by Elsie Needle MD Signature Date/Time: 08/21/2023/11:21:30 AM    Final    EP PPM/ICD IMPLANT Result Date: 08/21/2023 Conclusion: Successful extraction of a single-chamber ICD in a patient with enterococcal sepsis with no immediate procedural complications. Danelle Birmingham, MD           LOS: 14 days   Time spent= 35 mins    Burgess JAYSON Dare, MD [facility]  If 7PM-7AM, please contact night-coverage  08/21/2023, 1:50 PM

## 2023-08-21 NOTE — Progress Notes (Addendum)
 Rounding Note   Patient Name: Ebony Scott Date of Encounter: 08/21/2023  [facility] Cardiologist: Shelda Bruckner, MD   Subjective  Seen in holding area this morning, got some sleep last night, no new symptoms or concerns  Scheduled Meds:  acidophilus  1 capsule Oral TID   atorvastatin   80 mg Oral QPM   benzonatate   200 mg Oral TID   budesonide -glycopyrrolate -formoterol   2 puff Inhalation BID   Chlorhexidine  Gluconate Cloth  6 each Topical Daily   cholecalciferol   1,000 Units Oral QHS   empagliflozin   10 mg Oral Daily   fluticasone   2 spray Each Nare Daily   folic acid   1 mg Oral Daily   gabapentin   300 mg Oral QHS   gentamicin  (GARAMYCIN ) 80 mg in sodium chloride  0.9 % 500 mL irrigation  80 mg Irrigation On Call   insulin  aspart  0-5 Units Subcutaneous QHS   insulin  aspart  0-9 Units Subcutaneous TID WC   nystatin  cream   Topical TID   pantoprazole   40 mg Oral Daily   povidone-iodine   2 Application Topical Once   predniSONE   5 mg Oral Q breakfast   sacubitril -valsartan   1 tablet Oral BID   sodium chloride  flush  10-40 mL Intracatheter Q12H   Continuous Infusions:  sodium chloride  50 mL/hr at 08/21/23 9367   ampicillin  (OMNIPEN) IV 2 g (08/21/23 0559)    ceFAZolin  (ANCEF ) IV     cefTRIAXone  (ROCEPHIN )  IV 2 g (08/20/23 2328)   PRN Meds: acetaminophen  **OR** acetaminophen , guaiFENesin -dextromethorphan , ipratropium-albuterol , loperamide , ondansetron  **OR** ondansetron  (ZOFRAN ) IV, senna-docusate, sodium chloride  flush   Vital Signs  Vitals:   08/20/23 1927 08/20/23 2201 08/21/23 0015 08/21/23 0419  BP: (!) 105/59  138/60 122/68  Pulse: 65  66 (!) 57  Resp: 16  20 18   Temp: 98.5 F (36.9 C)  98.8 F (37.1 C) 99.3 F (37.4 C)  TempSrc: Oral  Oral Oral  SpO2: 97% 96% 99% 98%  Weight:      Height:        Intake/Output Summary (Last 24 hours) at 08/21/2023 0713 Last data filed at 08/20/2023 2328 Gross per 24 hour  Intake 1200 ml  Output --  Net  1200 ml      08/09/2023   10:54 PM 08/07/2023   11:00 PM 08/02/2023    2:12 PM  Last 3 Weights  Weight (lbs) 122 lb 5.7 oz 130 lb 1.1 oz 130 lb  Weight (kg) 55.5 kg 59 kg 58.968 kg      Telemetry SB/SR 50's > mostly 60's, no pacing, infrequent PVCs - Personally Reviewed  ECG  No new EKGs - Personally Reviewed  Physical Exam  GEN: No acute distress, chronically ill appearing.   Neck: No JVD Cardiac: RRR, no murmurs, rubs, or gallops.  Respiratory: some soft ronchi that clears w/cough well, otherwise CTA b/l. GI: Soft, nontender, non-distended  MS: No edema; No deformity. Neuro:  Nonfocal  Psych: Normal affect   Labs High Sensitivity Troponin:  No results for input(s): TROPONINIHS in the last 720 hours.   Chemistry Recent Labs  Lab 08/17/23 0708 08/18/23 0708 08/19/23 0435 08/21/23 0500  NA 143 139 138 138  K 4.0 4.0 4.1 4.1  CL 108 106 104 105  CO2 23 25 22 23   GLUCOSE 84 85 83 88  BUN 25* 20 18 20   CREATININE 1.77* 1.55* 1.41* 1.45*  CALCIUM  8.5* 8.3* 8.5* 8.5*  MG 1.8  --   --   --  GFRNONAA 30* 35* 39* 38*  ANIONGAP 12 8 12 10     Lipids No results for input(s): CHOL, TRIG, HDL, LABVLDL, LDLCALC, CHOLHDL in the last 168 hours.  Hematology Recent Labs  Lab 08/15/23 1836 08/20/23 0704 08/21/23 0500  WBC 12.4* 8.9 9.3  RBC 3.07* 2.86* 2.87*  HGB 9.8* 9.2* 9.1*  HCT 30.4* 29.2* 28.9*  MCV 99.0 102.1* 100.7*  MCH 31.9 32.2 31.7  MCHC 32.2 31.5 31.5  RDW 15.5 14.9 15.1  PLT 179 182 187   Thyroid  No results for input(s): TSH, FREET4 in the last 168 hours.  BNPNo results for input(s): BNP, PROBNP in the last 168 hours.  DDimer No results for input(s): DDIMER in the last 168 hours.   Radiology  No results found.  Cardiac Studies  08/08/23: TTE 1. Left ventricular ejection fraction, by estimation, is 25 to 30%. The  left ventricle has severely decreased function. The left ventricle  demonstrates regional wall motion  abnormalities (see scoring  diagram/findings for description). There is moderate  asymmetric left ventricular hypertrophy of the basal-septal segment. Left  ventricular diastolic parameters are consistent with Grade I diastolic  dysfunction (impaired relaxation).   2. Right ventricular systolic function is normal. The right ventricular  size is normal. There is normal pulmonary artery systolic pressure.   3. Left atrial size was moderately dilated.   4. The mitral valve is normal in structure. No evidence of mitral valve  regurgitation. No evidence of mitral stenosis.   5. The aortic valve is tricuspid. There is mild calcification of the  aortic valve. Aortic valve regurgitation is trivial. No aortic stenosis is  present.   6. The inferior vena cava is normal in size with greater than 50%  respiratory variability, suggesting right atrial pressure of 3 mmHg.   08/12/23: TEE 1. Akinesis of the distal anteroseptal wall and apex with overall severe  LV dysfunction; no vegetations noted on RV lead or valves.   2. Left ventricular ejection fraction, by estimation, is 25 to 30%. The  left ventricle has severely decreased function. The left ventricle  demonstrates regional wall motion abnormalities (see scoring  diagram/findings for description). The left  ventricular internal cavity size was mildly dilated.   3. Right ventricular systolic function is normal. The right ventricular  size is normal.   4. Left atrial size was moderately dilated. No left atrial/left atrial  appendage thrombus was detected.   5. The mitral valve is normal in structure. Trivial mitral valve  regurgitation.   6. The aortic valve is tricuspid. Aortic valve regurgitation is trivial.   7. There is Moderate (Grade III) plaque involving the descending aorta.    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 There is moderate hypokinesis of the LV globally, G1 DD       Patient Profile   73 y.o. female w/PMHx of  DVT/PE, bronchiectasis, OSA, GERD, CAD s/p PCI in 12/2020, RA on chronic steroids  CKD (IIIb) ICM  Chronic CHF (systolic) ICD  ICD was implanted 08/27/22 --- Fairly acutely post implant July 2024 febrile illness > w/negative [facility] --- Oct 2024 admitted with sepsis/CAP  NOW admitted 08/06/23 with severe weakness, fever and chills and was found  to have E.Faecalis bacteremia  EP brought on board and planned for ICD system removal Gen cards also on board, with some lower BPs   Assessment & Plan   Bacteremia BC 08/06/23: + Enterococcus faecalis infection Enterococcal bacteremia ID team on  board no GI or GU symptoms  - 6/17 UA negative for infection, strep pneumo, Legionella urine antigen negative - 6/19 blood cx NG in 5 days - 6/19 TTE with no vegetations  - 6/23 TEE no vegetation on RV lead or valves PLAN: Ampicillin  8g daily as a continuous infusion + Rocephin  2g IV every 12 hours  Duration: 6 weeks  End Date: 09/19/23  ICD Planned for system removal today 08/22/23 Remotes with no RV pacing burden No pacing on tele  implanted 08/27/22 Single lead, 7122Q [place] implanted same day --Last dose of Xarelto  was 08/18/23 (Hx of DVT/PE, remotely, on life-long a/c) SCD's ordered --Last dose of Plavix  08/16/23 (hx of PCI 2022) Stable labs Patient remains agreeable to proceed Confirmed with Dr. Waddell No need for CTS back up or blood on hold given <1 year  since implant    Chronic CHF CAD/likely ischemic etiology Appears compensated BPs remain better C/w general cardiology team     Further with attending IM/ID teams    For questions or updates, please contact [facility] Please consult [email or web address] for contact info under     Signed, Charlies Macario Arthur, PA-C  08/21/2023, 7:13 AM   ' EP Attending  Patient seen and examined. See my note as well. She is stable for ICD system extraction. I have reviewed the indications/risks/benefits/goals/expectations and she is willing to proceed.  Danelle Lameka Disla,MD

## 2023-08-21 NOTE — Progress Notes (Signed)
 Occupational Therapy Treatment Patient Details Name: Ebony Scott MRN: [id number] DOB: [birth year] Today's Date: 08/21/2023   History of present illness Patient is a 73 y.o. female presenting 6/17 with fatigue, AMS, and fever. Work up revealed recurrent community acquired PNA and AKI on CKD III. Hospital course complicated by enterococcal bacteremia, s/p TEE 6/23. Planned extraction of ICD 7/2. PMH includes: anemia, breast cancer, CHF, CAD, fibromyalgia, hypothyroidism, bronchiectasis/asthma with chronic hypoxic respiratory failure on nocturnal oxygen , PNA, PE, sleep apnea.   OT comments  Pt progressing well towards goals. Pt reporting fatigue s/p ICD extraction but agreeable to session. Pt limited d/t c/o of dizziness and lightheadedness. Soft BP in both supine and standing. Pt progressing to require less cues for safety and good skill carryover, but is limited by fatigue. Continue to recommend <3 hours of skilled rehab daily to optimize independence levels. Will continue to follow acutely.       If plan is discharge home, recommend the following:  A little help with walking and/or transfers;A little help with bathing/dressing/bathroom;Help with stairs or ramp for entrance   Equipment Recommendations  None recommended by OT    Recommendations for Other Services      Precautions / Restrictions Precautions Precautions: Fall Recall of Precautions/Restrictions: Intact Precaution/Restrictions Comments: monitor BP Restrictions Weight Bearing Restrictions Per Provider Order: No       Mobility Bed Mobility Overal bed mobility: Modified Independent             General bed mobility comments: increased time, use of bed features    Transfers Overall transfer level: Needs assistance Equipment used: Rolling walker (2 wheels) Transfers: Sit to/from Stand, Bed to chair/wheelchair/BSC Sit to Stand: Contact guard assist     Step pivot transfers: Contact guard assist     General  transfer comment: Slow to come to stand, CGA for balance     Balance Overall balance assessment: Needs assistance Sitting-balance support: No upper extremity supported, Feet unsupported Sitting balance-Leahy Scale: Good     Standing balance support: Bilateral upper extremity supported, During functional activity, Reliant on assistive device for balance Standing balance-Leahy Scale: Fair Standing balance comment: Reliant on RW                           ADL either performed or assessed with clinical judgement   ADL Overall ADL's : Needs assistance/impaired                         Toilet Transfer: Rolling walker (2 wheels);Ambulation;Contact guard assist Toilet Transfer Details (indicate cue type and reason): Simulated in room to CGA d/t balance Toileting- Clothing Manipulation and Hygiene: Contact guard assist;Sit to/from stand Toileting - Clothing Manipulation Details (indicate cue type and reason): CGA for balance with unilateral support     Functional mobility during ADLs: Contact guard assist;Rolling walker (2 wheels) General ADL Comments: Pt with soft bp limiting activity tolerance    Extremity/Trunk Assessment Upper Extremity Assessment Upper Extremity Assessment: RUE deficits/detail;LUE deficits/detail RUE Deficits / Details: RA deformites with OA crepedus noted with MMT. WFL ROM but severe weakness of 3-/5 to 3+/5. RA node on D5 which is painful. 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 WFL ROM but severe weakness of 3-/5 to 3+/5. LUE Coordination: decreased fine motor   Lower Extremity Assessment Lower Extremity Assessment: Defer to PT evaluation  Vision   Vision Assessment?: No apparent visual deficits         Communication Communication Communication: No apparent difficulties   Cognition Arousal: Alert Behavior During Therapy: WFL for tasks  assessed/performed Cognition: No apparent impairments     Following commands: Intact        Cueing   Cueing Techniques: Verbal cues        General Comments Pt with soft BP during session, BP in standing 93/46    Pertinent Vitals/ Pain       Pain Assessment Pain Assessment: No/denies pain Pain Intervention(s): Monitored during session   Frequency  Min 2X/week        Progress Toward Goals  OT Goals(current goals can now be found in the care plan section)  Progress towards OT goals: Progressing toward goals  Acute Rehab OT Goals Patient Stated Goal: To rest OT Goal Formulation: With patient/family Time For Goal Achievement: 09/04/23 Potential to Achieve Goals: Good ADL Goals Pt Will Perform Grooming: with modified independence;standing Pt Will Transfer to Toilet: with modified independence;ambulating Pt Will Perform Toileting - Clothing Manipulation and hygiene: with modified independence Additional ADL Goal #1: Patient will identify at least 3 energy conservation strategies to employ at home in order to maximize function and quality of life and decrease caregiver burden while preventing exacerbation of symptoms and rehospitalization. Additional ADL Goal #2: Pt will use her handout as needed to identify at least 3 ways she can protect her joints and avoid progression of RA deformity and pain in hands/UEs while performing BADLs and IADLs.  Plan         AM-PAC OT 6 Clicks Daily Activity     Outcome Measure   Help from another person eating meals?: None Help from another person taking care of personal grooming?: None Help from another person toileting, which includes using toliet, bedpan, or urinal?: A Little Help from another person bathing (including washing, rinsing, drying)?: A Little Help from another person to put on and taking off regular upper body clothing?: A Little Help from another person to put on and taking off regular lower body clothing?: A Little 6  Click Score: 20    End of Session Equipment Utilized During Treatment: Rolling walker (2 wheels);Gait belt  OT Visit Diagnosis: Unsteadiness on feet (R26.81);Pain;Muscle weakness (generalized) (M62.81) Pain - part of body: Ankle and joints of foot   Activity Tolerance Patient limited by fatigue   Patient Left in chair;with call bell/phone within reach;with chair alarm set   Nurse Communication Mobility status        Time: 8441-8381 OT Time Calculation (min): 20 min  Charges: OT General Charges $OT Visit: 1 Visit OT Treatments $Self Care/Home Management : 8-22 mins  Adrianne BROCKS, OT  Acute Rehabilitation Services Office [phone] Secure chat preferred   Adrianne GORMAN Savers 08/21/2023, 4:55 PM

## 2023-08-21 NOTE — Anesthesia Postprocedure Evaluation (Signed)
 Anesthesia Post Note  Patient: [name]  Procedure(s) Performed: LEAD EXTRACTION ICD GENERATOR REMOVAL     Patient location during evaluation: Cath Lab Anesthesia Type: General Level of consciousness: awake and alert Pain management: pain level controlled Vital Signs Assessment: post-procedure vital signs reviewed and stable Respiratory status: spontaneous breathing, nonlabored ventilation and respiratory function stable Cardiovascular status: blood pressure returned to baseline and stable Postop Assessment: no apparent nausea or vomiting Anesthetic complications: no   No notable events documented.  Last Vitals:  Vitals:   08/21/23 1045 08/21/23 1100  BP: (!) 137/92 125/74  Pulse: 76 73  Resp: 15 17  Temp:    SpO2: 93% 95%    Last Pain:  Vitals:   08/21/23 1045  TempSrc:   PainSc: 4                  Nalee Lightle,W. EDMOND

## 2023-08-22 ENCOUNTER — Encounter (HOSPITAL_COMMUNITY): Payer: Self-pay | Admitting: Internal Medicine

## 2023-08-22 ENCOUNTER — Inpatient Hospital Stay (HOSPITAL_COMMUNITY)

## 2023-08-22 ENCOUNTER — Telehealth: Payer: Self-pay | Admitting: *Deleted

## 2023-08-22 DIAGNOSIS — Z7409 Other reduced mobility: Secondary | ICD-10-CM | POA: Diagnosis not present

## 2023-08-22 DIAGNOSIS — I2510 Atherosclerotic heart disease of native coronary artery without angina pectoris: Secondary | ICD-10-CM | POA: Diagnosis not present

## 2023-08-22 DIAGNOSIS — M6281 Muscle weakness (generalized): Secondary | ICD-10-CM | POA: Diagnosis not present

## 2023-08-22 DIAGNOSIS — N183 Chronic kidney disease, stage 3 unspecified: Secondary | ICD-10-CM | POA: Diagnosis not present

## 2023-08-22 DIAGNOSIS — F5105 Insomnia due to other mental disorder: Secondary | ICD-10-CM | POA: Diagnosis not present

## 2023-08-22 DIAGNOSIS — M542 Cervicalgia: Secondary | ICD-10-CM | POA: Diagnosis not present

## 2023-08-22 DIAGNOSIS — R531 Weakness: Secondary | ICD-10-CM | POA: Diagnosis not present

## 2023-08-22 DIAGNOSIS — R059 Cough, unspecified: Secondary | ICD-10-CM | POA: Diagnosis not present

## 2023-08-22 DIAGNOSIS — J45909 Unspecified asthma, uncomplicated: Secondary | ICD-10-CM | POA: Diagnosis not present

## 2023-08-22 DIAGNOSIS — G8929 Other chronic pain: Secondary | ICD-10-CM | POA: Diagnosis not present

## 2023-08-22 DIAGNOSIS — M79672 Pain in left foot: Secondary | ICD-10-CM | POA: Diagnosis not present

## 2023-08-22 DIAGNOSIS — I272 Pulmonary hypertension, unspecified: Secondary | ICD-10-CM | POA: Diagnosis not present

## 2023-08-22 DIAGNOSIS — N179 Acute kidney failure, unspecified: Secondary | ICD-10-CM | POA: Diagnosis not present

## 2023-08-22 DIAGNOSIS — R918 Other nonspecific abnormal finding of lung field: Secondary | ICD-10-CM | POA: Diagnosis not present

## 2023-08-22 DIAGNOSIS — E119 Type 2 diabetes mellitus without complications: Secondary | ICD-10-CM | POA: Diagnosis not present

## 2023-08-22 DIAGNOSIS — B379 Candidiasis, unspecified: Secondary | ICD-10-CM | POA: Diagnosis not present

## 2023-08-22 DIAGNOSIS — G4736 Sleep related hypoventilation in conditions classified elsewhere: Secondary | ICD-10-CM | POA: Diagnosis not present

## 2023-08-22 DIAGNOSIS — D649 Anemia, unspecified: Secondary | ICD-10-CM | POA: Diagnosis not present

## 2023-08-22 DIAGNOSIS — J9610 Chronic respiratory failure, unspecified whether with hypoxia or hypercapnia: Secondary | ICD-10-CM | POA: Diagnosis not present

## 2023-08-22 DIAGNOSIS — R197 Diarrhea, unspecified: Secondary | ICD-10-CM | POA: Diagnosis not present

## 2023-08-22 DIAGNOSIS — Z66 Do not resuscitate: Secondary | ICD-10-CM | POA: Diagnosis not present

## 2023-08-22 DIAGNOSIS — E440 Moderate protein-calorie malnutrition: Secondary | ICD-10-CM | POA: Diagnosis not present

## 2023-08-22 DIAGNOSIS — T827XXA Infection and inflammatory reaction due to other cardiac and vascular devices, implants and grafts, initial encounter: Secondary | ICD-10-CM | POA: Diagnosis not present

## 2023-08-22 DIAGNOSIS — M79671 Pain in right foot: Secondary | ICD-10-CM | POA: Diagnosis not present

## 2023-08-22 DIAGNOSIS — D801 Nonfamilial hypogammaglobulinemia: Secondary | ICD-10-CM | POA: Diagnosis not present

## 2023-08-22 DIAGNOSIS — T827XXD Infection and inflammatory reaction due to other cardiac and vascular devices, implants and grafts, subsequent encounter: Secondary | ICD-10-CM | POA: Diagnosis not present

## 2023-08-22 DIAGNOSIS — J4550 Severe persistent asthma, uncomplicated: Secondary | ICD-10-CM | POA: Diagnosis not present

## 2023-08-22 DIAGNOSIS — J984 Other disorders of lung: Secondary | ICD-10-CM | POA: Diagnosis not present

## 2023-08-22 DIAGNOSIS — N170 Acute kidney failure with tubular necrosis: Secondary | ICD-10-CM | POA: Diagnosis not present

## 2023-08-22 DIAGNOSIS — Z5181 Encounter for therapeutic drug level monitoring: Secondary | ICD-10-CM | POA: Diagnosis not present

## 2023-08-22 DIAGNOSIS — J470 Bronchiectasis with acute lower respiratory infection: Secondary | ICD-10-CM | POA: Diagnosis not present

## 2023-08-22 DIAGNOSIS — Z7952 Long term (current) use of systemic steroids: Secondary | ICD-10-CM | POA: Diagnosis not present

## 2023-08-22 DIAGNOSIS — M0600 Rheumatoid arthritis without rheumatoid factor, unspecified site: Secondary | ICD-10-CM | POA: Diagnosis not present

## 2023-08-22 DIAGNOSIS — M797 Fibromyalgia: Secondary | ICD-10-CM | POA: Diagnosis not present

## 2023-08-22 DIAGNOSIS — J9611 Chronic respiratory failure with hypoxia: Secondary | ICD-10-CM | POA: Diagnosis not present

## 2023-08-22 DIAGNOSIS — I82401 Acute embolism and thrombosis of unspecified deep veins of right lower extremity: Secondary | ICD-10-CM | POA: Diagnosis not present

## 2023-08-22 DIAGNOSIS — D529 Folate deficiency anemia, unspecified: Secondary | ICD-10-CM | POA: Diagnosis not present

## 2023-08-22 DIAGNOSIS — E872 Acidosis, unspecified: Secondary | ICD-10-CM | POA: Diagnosis not present

## 2023-08-22 DIAGNOSIS — J9621 Acute and chronic respiratory failure with hypoxia: Secondary | ICD-10-CM | POA: Diagnosis not present

## 2023-08-22 DIAGNOSIS — Z9181 History of falling: Secondary | ICD-10-CM | POA: Diagnosis not present

## 2023-08-22 DIAGNOSIS — I429 Cardiomyopathy, unspecified: Secondary | ICD-10-CM | POA: Diagnosis not present

## 2023-08-22 DIAGNOSIS — R262 Difficulty in walking, not elsewhere classified: Secondary | ICD-10-CM | POA: Diagnosis not present

## 2023-08-22 DIAGNOSIS — Z7901 Long term (current) use of anticoagulants: Secondary | ICD-10-CM | POA: Diagnosis not present

## 2023-08-22 DIAGNOSIS — M810 Age-related osteoporosis without current pathological fracture: Secondary | ICD-10-CM | POA: Diagnosis not present

## 2023-08-22 DIAGNOSIS — F331 Major depressive disorder, recurrent, moderate: Secondary | ICD-10-CM | POA: Diagnosis not present

## 2023-08-22 DIAGNOSIS — I5022 Chronic systolic (congestive) heart failure: Secondary | ICD-10-CM | POA: Diagnosis not present

## 2023-08-22 DIAGNOSIS — G4733 Obstructive sleep apnea (adult) (pediatric): Secondary | ICD-10-CM | POA: Diagnosis not present

## 2023-08-22 DIAGNOSIS — I130 Hypertensive heart and chronic kidney disease with heart failure and stage 1 through stage 4 chronic kidney disease, or unspecified chronic kidney disease: Secondary | ICD-10-CM | POA: Diagnosis not present

## 2023-08-22 DIAGNOSIS — Z79899 Other long term (current) drug therapy: Secondary | ICD-10-CM | POA: Diagnosis not present

## 2023-08-22 DIAGNOSIS — Z7401 Bed confinement status: Secondary | ICD-10-CM | POA: Diagnosis not present

## 2023-08-22 DIAGNOSIS — R7881 Bacteremia: Secondary | ICD-10-CM | POA: Diagnosis not present

## 2023-08-22 DIAGNOSIS — J189 Pneumonia, unspecified organism: Secondary | ICD-10-CM | POA: Diagnosis not present

## 2023-08-22 DIAGNOSIS — Z9981 Dependence on supplemental oxygen: Secondary | ICD-10-CM | POA: Diagnosis not present

## 2023-08-22 DIAGNOSIS — I509 Heart failure, unspecified: Secondary | ICD-10-CM | POA: Diagnosis not present

## 2023-08-22 DIAGNOSIS — Z792 Long term (current) use of antibiotics: Secondary | ICD-10-CM | POA: Diagnosis not present

## 2023-08-22 DIAGNOSIS — E1122 Type 2 diabetes mellitus with diabetic chronic kidney disease: Secondary | ICD-10-CM | POA: Diagnosis not present

## 2023-08-22 DIAGNOSIS — A4181 Sepsis due to Enterococcus: Secondary | ICD-10-CM | POA: Diagnosis not present

## 2023-08-22 DIAGNOSIS — R0602 Shortness of breath: Secondary | ICD-10-CM | POA: Diagnosis not present

## 2023-08-22 DIAGNOSIS — J9620 Acute and chronic respiratory failure, unspecified whether with hypoxia or hypercapnia: Secondary | ICD-10-CM | POA: Diagnosis not present

## 2023-08-22 DIAGNOSIS — I255 Ischemic cardiomyopathy: Secondary | ICD-10-CM | POA: Diagnosis not present

## 2023-08-22 DIAGNOSIS — R21 Rash and other nonspecific skin eruption: Secondary | ICD-10-CM | POA: Diagnosis not present

## 2023-08-22 DIAGNOSIS — R2689 Other abnormalities of gait and mobility: Secondary | ICD-10-CM | POA: Diagnosis not present

## 2023-08-22 DIAGNOSIS — A419 Sepsis, unspecified organism: Secondary | ICD-10-CM | POA: Diagnosis not present

## 2023-08-22 DIAGNOSIS — J471 Bronchiectasis with (acute) exacerbation: Secondary | ICD-10-CM | POA: Diagnosis not present

## 2023-08-22 DIAGNOSIS — J4551 Severe persistent asthma with (acute) exacerbation: Secondary | ICD-10-CM | POA: Diagnosis not present

## 2023-08-22 DIAGNOSIS — Z95810 Presence of automatic (implantable) cardiac defibrillator: Secondary | ICD-10-CM | POA: Diagnosis not present

## 2023-08-22 DIAGNOSIS — B952 Enterococcus as the cause of diseases classified elsewhere: Secondary | ICD-10-CM | POA: Diagnosis not present

## 2023-08-22 DIAGNOSIS — I5032 Chronic diastolic (congestive) heart failure: Secondary | ICD-10-CM | POA: Diagnosis not present

## 2023-08-22 DIAGNOSIS — J9811 Atelectasis: Secondary | ICD-10-CM | POA: Diagnosis not present

## 2023-08-22 DIAGNOSIS — Z1152 Encounter for screening for COVID-19: Secondary | ICD-10-CM | POA: Diagnosis not present

## 2023-08-22 DIAGNOSIS — I5042 Chronic combined systolic (congestive) and diastolic (congestive) heart failure: Secondary | ICD-10-CM | POA: Diagnosis not present

## 2023-08-22 DIAGNOSIS — G894 Chronic pain syndrome: Secondary | ICD-10-CM | POA: Diagnosis not present

## 2023-08-22 DIAGNOSIS — J4540 Moderate persistent asthma, uncomplicated: Secondary | ICD-10-CM | POA: Diagnosis not present

## 2023-08-22 DIAGNOSIS — E0590 Thyrotoxicosis, unspecified without thyrotoxic crisis or storm: Secondary | ICD-10-CM | POA: Diagnosis not present

## 2023-08-22 DIAGNOSIS — J479 Bronchiectasis, uncomplicated: Secondary | ICD-10-CM | POA: Diagnosis not present

## 2023-08-22 LAB — BASIC METABOLIC PANEL WITH GFR
Anion gap: 9 (ref 5–15)
BUN: 22 mg/dL (ref 8–23)
CO2: 21 mmol/L — ABNORMAL LOW (ref 22–32)
Calcium: 8.3 mg/dL — ABNORMAL LOW (ref 8.9–10.3)
Chloride: 108 mmol/L (ref 98–111)
Creatinine, Ser: 1.61 mg/dL — ABNORMAL HIGH (ref 0.44–1.00)
GFR, Estimated: 33 mL/min — ABNORMAL LOW (ref >60)
Glucose, Bld: 85 mg/dL (ref 70–99)
Potassium: 4 mmol/L (ref 3.5–5.1)
Sodium: 138 mmol/L (ref 135–145)

## 2023-08-22 LAB — PHOSPHORUS: Phosphorus: 3.2 mg/dL (ref 2.5–4.6)

## 2023-08-22 LAB — GLUCOSE, CAPILLARY: Glucose-Capillary: 195 mg/dL — ABNORMAL HIGH (ref 70–99)

## 2023-08-22 LAB — MAGNESIUM: Magnesium: 2.1 mg/dL (ref 1.7–2.4)

## 2023-08-22 IMAGING — DX DG CHEST 1V PORT
1 series · 1 of 1 positions shown · non-contrast
Comparison: 10/06/2020 and older studies.

CLINICAL DATA: Per chart: Patient reports she had blood work at pcp
office yesterday, pcp office called today and told patient to come
to ED due to hemoglobin being 6.5. Hx of asthma, DVT

EXAM:
PORTABLE CHEST 1 VIEW

[chest ap]
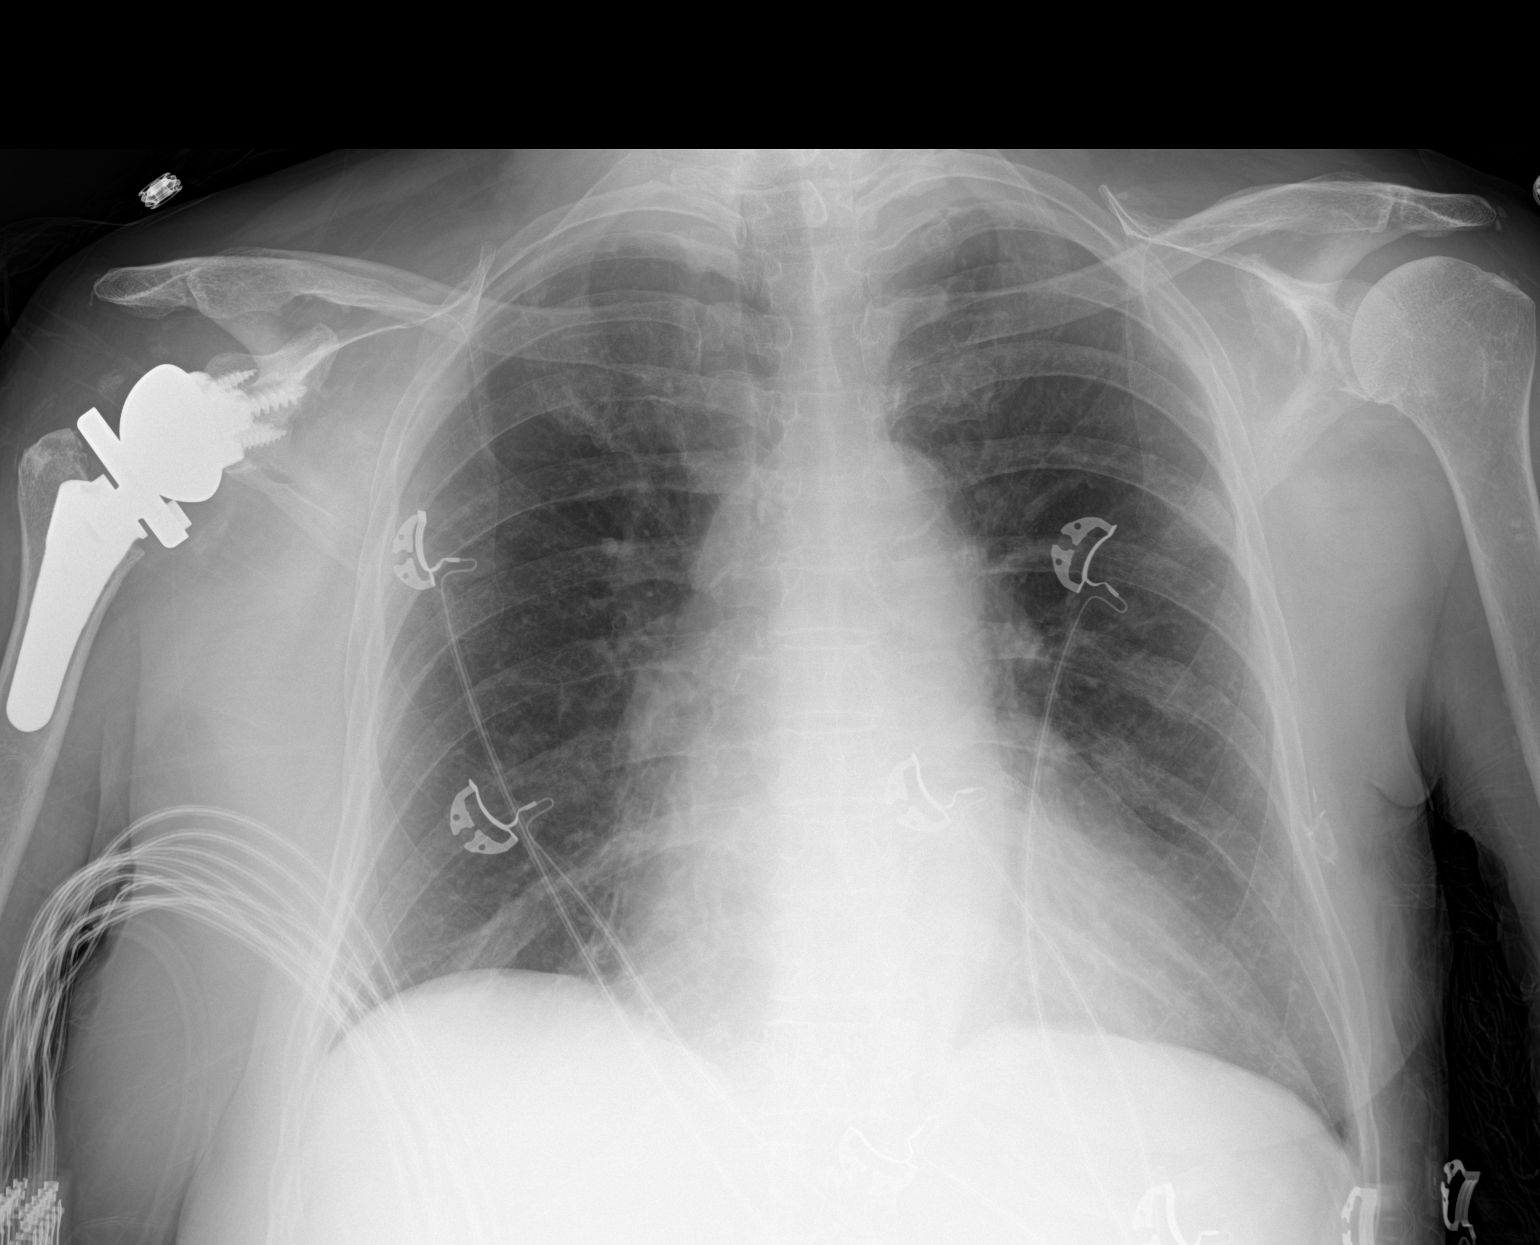

[1 of 1 positions shown; findings below may reference images not displayed]

FINDINGS: Cardiac silhouette mildly enlarged.  No mediastinal or hilar masses.

Clear lungs.  No convincing pleural effusion.  No pneumothorax.

Stable right reverse shoulder arthroplasty.
IMPRESSION: No acute cardiopulmonary disease.

## 2023-08-22 MED ORDER — RIVAROXABAN 20 MG PO TABS: 20.0000 mg | ORAL_TABLET | Freq: Every day | ORAL | Status: AC

## 2023-08-22 MED ORDER — TRAMADOL HCL 50 MG PO TABS: 50.0000 mg | ORAL_TABLET | Freq: Every day | ORAL | 0 refills | Status: DC

## 2023-08-22 MED ORDER — AMPICILLIN IV (FOR PTA / DISCHARGE USE ONLY): 2.0000 g | Freq: Three times a day (TID) | INTRAVENOUS | 0 refills | Status: AC

## 2023-08-22 MED ORDER — FUROSEMIDE 40 MG PO TABS: 20.0000 mg | ORAL_TABLET | Freq: Every day | ORAL | Status: AC | PRN

## 2023-08-22 MED ORDER — CEFTRIAXONE IV (FOR PTA / DISCHARGE USE ONLY): 2.0000 g | Freq: Two times a day (BID) | INTRAVENOUS | 0 refills | Status: AC

## 2023-08-22 MED ORDER — ACETAMINOPHEN 500 MG PO TABS: 500.0000 mg | ORAL_TABLET | Freq: Four times a day (QID) | ORAL | Status: AC | PRN

## 2023-08-22 NOTE — Discharge Summary (Signed)
 Physician Discharge Summary  Ebony Scott FMW:[id number] DOB: [birth year] DOA: 08/06/2023  PCP: [name], MD  Admit date: 08/06/2023 Discharge date: 08/22/2023  Admitted From: Home Disposition: SNF  Recommendations for Outpatient Follow-up:  Follow up with PCP in 1-2 weeks Please obtain BMP/CBC in one week your next doctors visit.  Lasix  20 mg daily as needed for weight gain or edema greater than 3 pounds Ampicillin  and Rocephin  ordered, end of treatment 09/19/2023 Patient is no longer on Plavix .  Restarted Xarelto  on August 26, 2023   Discharge Condition: Stable CODE STATUS: DNR Diet recommendation: Heart healthy  Brief/Interim Summary: Brief Narrative:    73 y.o. 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  She is admitted for CIED associated E faecalis bacteremia, community acquired pneumonia, acute respiratory failure and AKI.  ID following.  ICD extracted on 7/2.  Plan for Rocephin  2 g every 12 hours until 09/19/2023.  Patient is no longer on Plavix .  Xarelto  is to be resumed on August 26, 2023.  Continue antibiotics as mentioned above.  Will need outpatient follow-up with cardiology and PCP.  Assessment & Plan:  Principal Problem:   Community acquired pneumonia Active Problems:   Chronic systolic heart failure (HCC)   Generalized weakness   AKI (acute kidney injury) (HCC)   Enterococcus faecalis infection   Enterococcal bacteremia    CIED associated Enterococcus faecalis bacteremia Seen by ID.  ICD extracted 7/2 -  Will need antibiotics ampicillin  8 g continuous infusion plus Rocephin  2 g every 12 hours until 09/19/2023.  Initial blood culture 6/17-positive for Enterococcus faecalis Repeat blood culture 6/19-NGTD TEE 6/23-no thrombus or vegetation seen    Sepsis due to community-acquired pneumonia/recurrent pneumonitis Currently on antibiotic therapy   Acute kidney injury on stage IIIb chronic kidney disease Baseline creatinine reportedly 1.3-1.5.  Creatinine peaked 2.10, now back to baseline - Continue Jardiance , Entresto    Acute on chronic respiratory failure with hypoxia/bronchiectasis At baseline patient reportedly uses [place] oxygen  2 to 3 L/min at night. Acute respiratory failure has resolved.  May have to do home O2 evaluation at time of discharge.   Ischemic cardiomyopathy/chronic HFrEF/s/p ICD ef 25% Overall appears euvolemic.  Lasix  and BiDil  are currently on hold Continue Entresto  and Jardiance  ICD extracted 7/2   CAD s/p DES to LAD Lipitor..  Plavix  discontinued by cardiology 6/27.   History of PE/DVT Management of Xarelto  as noted above   Hypogammaglobulinemia On IVIG as outpatient. Follows with Dr. Maurilio, Allergist.  Patient has missed a couple doses of the IVIG while hospitalized which she has to get weekly.  As discussed with daughter, has an appointment on Wednesday of next week to see him and can maybe get it at that time.   Seropositive rheumatoid arthritis/chronic steroid use Back on prednisone  5 Mg daily.  Follow-up outpatient rheumatology   Type II DM, controlled A1c 6.5 on 6/18 CBGs well-controlled, discontinued SSI.   Chronic pain syndrome Continue gabapentin    Hypokalemia Replaced.   Generalized weakness/physical deconditioning PT/OT   Chronic cough Reports that this has been going on for maybe a year and is unchanged.  Initiated Tessalon  Perles and Robitussin DM as needed.  Monitor.  Improved cough.   Body mass index is 22.38 kg/m.     DVT  prophylaxis: Place and maintain sequential compression device Start: 08/19/23 0910     Code Status: Limited: Do not attempt resuscitation (DNR) -DNR-LIMITED -Do Not Intubate/DNI :  Family Communication: Daughter via phone.  Disposition:  Status is: Inpatient Remains inpatient appropriate because: Definitive plans after ICD extraction     DVT prophylaxis: Place and maintain sequential compression device Start: 08/19/23 0910      Code Status: Limited: Do not attempt resuscitation (DNR) -DNR-LIMITED -Do Not Intubate/DNI  Family Communication:   Status is: Inpatient Remains inpatient appropriate because: Will need SNF placement once cleared by cardiology    Subjective:  Seen at bedside after ICD extraction. No complaints  Examination:  General exam: Appears calm and comfortable  Respiratory system: Clear to auscultation. Respiratory effort normal. Cardiovascular system: S1 & S2 heard, RRR. No JVD, murmurs, rubs, gallops or clicks. No pedal edema. Gastrointestinal system: Abdomen is nondistended, soft and nontender. No organomegaly or masses felt. Normal bowel sounds heard. Central nervous system: Alert and oriented. No focal neurological deficits. Extremities: Symmetric 5 x 5 power. Skin: No rashes, lesions or ulcers Psychiatry: Judgement and insight appear normal. Mood & affect appropriate.    Discharge Diagnoses:  Principal Problem:   Community acquired pneumonia Active Problems:   Chronic systolic heart failure (HCC)   Generalized weakness   AKI (acute kidney injury) (HCC)   Enterococcus faecalis infection   Enterococcal bacteremia      Discharge Exam: Vitals:   08/22/23 0825 08/22/23 0925  BP: (!) 103/42 (!) 109/51  Pulse: 77 69  Resp: 17   Temp: 98.1 F (36.7 C)   SpO2: 95% 93%   Vitals:   08/22/23 0019 08/22/23 0500 08/22/23 0825 08/22/23 0925  BP: (!) 113/57 105/67 (!) 103/42 (!) 109/51  Pulse: 70 70 77 69  Resp: 16 15 17    Temp: 99.2 F (37.3 C)  99.3 F (37.4 C) 98.1 F (36.7 C)   TempSrc: Oral Oral Oral   SpO2: 98% 93% 95% 93%  Weight:      Height:          Discharge Instructions  Discharge Instructions     Home infusion instructions   Complete by: As directed    Instructions: Flushing of vascular access device: 0.9% NaCl pre/post medication administration and prn patency; Heparin  100 u/ml, 5ml for implanted ports and Heparin  10u/ml, 5ml for all other central venous catheters.      Allergies as of 08/22/2023       Reactions   Baclofen  Anaphylaxis, Other (See Comments)   Altered mental status requiring a 3-day hospital stay- patient became unresponsive Put me in a coma for five days, Altered mental status requiring a 3-day hospital stay- patient became unresponsive   Other Shortness Of Breath, Other (See Comments)   Grass and weeds sneezing; filled sinuses (02/20/2012)   Pork-derived Products Anaphylaxis   Shellfish Allergy  Anaphylaxis, Shortness Of Breath, Itching, Rash   Shrimp Extract Anaphylaxis, Other (See Comments)   ALL SHELLFISH   Tetracycline Hcl Nausea And Vomiting   Xolair  [omalizumab ] Other (See Comments)   Caused Blood clot   Zoledronic  Acid Other (See Comments)   Reclast - Fever, Put in hospital, dr said it was a reaction from a reaction  Fever, inability to walk, Put in hospital Fever, Put in hospital, dr said it was a reaction from a reaction ,  Other reaction(s): Unknown   Celecoxib Swelling   Feet swelling   Dilaudid  [hydromorphone  Hcl] Itching   Hydrocodone -acetaminophen  Itching   Levofloxacin  Other (See Comments)   GI upset   Molds & Smuts Cough   Morphine  And Codeine Hives, Itching   Oxycodone  Hcl Nausea And Vomiting   GI Intolerance   Paroxetine Nausea And Vomiting, Other (See Comments)   GI Intolerance also   Tetracycline Hcl Other (See Comments)   GI Intolerance   Oxycodone -acetaminophen  Itching   Diltiazem  Hcl Swelling   Dust Mite Extract Other (See Comments), Cough    sneezing (02/20/2012) too   Gamunex [immune Globulin ] Itching, Rash   Tolerates hizentra     Rituximab  Other (See Comments)   Anemia    Rofecoxib Swelling   Vioxx- feet swelling   Tree Extract Itching   tested and told I was allergic to it; never experienced a reaction to it (02/20/2012)        Medication List     STOP taking these medications    clopidogrel  75 MG tablet Commonly known as: PLAVIX    magnesium  oxide 400 (240 Mg) MG tablet Commonly known as: MAG-OX   potassium chloride  10 MEQ tablet Commonly known as: KLOR-CON    traMADol  50 MG tablet Commonly known as: ULTRAM        TAKE these medications    acetaminophen  500 MG tablet Commonly known as: TYLENOL  Take 1 tablet (500 mg total) by mouth every 6 (six) hours as needed for moderate pain (pain score 4-6) or mild pain (pain score 1-3). What changed:  how much to take when to take this reasons to take this   albuterol  108 (90 Base) MCG/ACT inhaler Commonly known as: VENTOLIN  HFA Inhale 2 puffs into the lungs every 6 (six) hours as needed for wheezing or shortness of breath.   albuterol  (2.5 MG/3ML) 0.083% nebulizer solution Commonly known as: PROVENTIL  Take 3 mLs (2.5 mg total) by nebulization every 4 (four) hours as needed for wheezing or shortness of breath.   Align 4 MG Caps Take 4 mg by mouth every evening.   allopurinol  100 MG tablet Commonly known as: ZYLOPRIM  Take 1 tablet (100 mg total) by mouth daily.   Alpha-Lipoic Acid 600 MG Tabs Take 600 mg by mouth in the morning.   ampicillin  IVPB Inject 2 g into the vein every 8 (eight) hours for 28 days. Indication:  E faecalis bacteremia/ICD infection First Dose: Yes Last Day of Therapy:  09/19/23 Labs - Once weekly:  CBC/D and BMP, Labs - Once weekly: ESR and CRP   atorvastatin  80 MG tablet Commonly known as: LIPITOR Take 1 tablet (80 mg total) by mouth every evening.   Azelastine  HCl 137 MCG/SPRAY Soln 2 sprays once per day What changed:   how much to take how to take this when to take this additional instructions   BEANO PO Take 2 tablets by mouth in the morning, at noon, and at bedtime.   cefTRIAXone  IVPB Commonly known as: ROCEPHIN  Inject 2 g into the vein every 12 (twelve) hours for 28 days. Indication:  E faecalis bacteremia/ICD infection First Dose: Yes Last Day of Therapy:  09/19/23 Labs - Once weekly:  CBC/D and BMP, Labs - Once weekly: ESR and CRP   cetirizine  10 MG tablet Commonly known as: ZYRTEC  Take 1 tablet (10 mg total) by mouth daily as needed for allergies (Can take a n extra dose during flare ups.). What changed: when to take this   CITRACAL CALCIUM +D 600-40-500 MG-MG-UNIT Tb24  Generic drug: Calcium -Magnesium -Vitamin D  Take 1 tablet by mouth daily.   denosumab  60 MG/ML Sosy injection Commonly known as: PROLIA  Inject 60 mg into the skin every 6 (six) months.   dexlansoprazole  60 MG capsule Commonly known as: Dexilant  Take 1 capsule (60 mg total) by mouth in the morning.   empagliflozin  10 MG Tabs tablet Commonly known as: Jardiance  Take 1 tablet (10 mg total) by mouth daily.   Entresto  24-26 MG Generic drug: sacubitril -valsartan  TAKE 1 TABLET BY MOUTH TWICE A DAY   EPINEPHrine  0.3 mg/0.3 mL Soaj injection Commonly known as: EPI-PEN USE AS DIRECTED BY YOUR PHYSICIAN INTRAMUSCULARLY AS NEEDED FOR ANAPHYLAXIS   famotidine  20 MG tablet Commonly known as: PEPCID  TAKE 1 TABLET (20 MG) BY MOUTH DAILY BEFORE LUNCH AND1 TABLET BY MOUTH EACH NIGHT AT BEDTIME What changed: See the new instructions.   fluticasone  50 MCG/ACT nasal spray Commonly known as: FLONASE  Place 2 sprays into both nostrils daily. What changed:  how much to take when to take this reasons to take this   folic acid  1 MG tablet Commonly known as: FOLVITE  Take 1 tablet (1 mg total) by mouth daily.   furosemide  40 MG tablet Commonly known as: LASIX  Take 0.5 tablets (20 mg total) by mouth daily as needed for fluid or  edema (Weight gain, edema >3lbs). Monday and Thursday What changed:  how much to take when to take this reasons to take this   gabapentin  300 MG capsule Commonly known as: NEURONTIN  Take 1 capsule (300 mg total) by mouth at bedtime.   guaiFENesin  600 MG 12 hr tablet Commonly known as: MUCINEX  Take 600 mg by mouth 2 (two) times daily.   Hizentra  10 GM/50ML Soln Generic drug: Immune Globulin  (Human) Inject 15 g into the skin once a week. 13 g Infusion   ipratropium-albuterol  0.5-2.5 (3) MG/3ML Soln Commonly known as: DUONEB Take 3 mLs by nebulization every 6 (six) hours as needed. What changed:  when to take this reasons to take this   isosorbide -hydrALAZINE  20-37.5 MG tablet Commonly known as: BIDIL  TAKE 1 TABLET BY MOUTH TWICE A DAY   LACTOSE FAST ACTING RELIEF PO Take 2 tablets by mouth 3 (three) times daily before meals.   montelukast  10 MG tablet Commonly known as: SINGULAIR  Take 1 tablet (10 mg total) by mouth daily after supper.   mupirocin  ointment 2 % Commonly known as: BACTROBAN  Apply 1 Application topically as needed (wounds).   Nebulizer Mask Adult Misc 1 Device by Does not apply route as directed.   nystatin  100000 UNIT/ML suspension Commonly known as: MYCOSTATIN  Swish, gargle, and swallow 5 ml after Trelegy use   predniSONE  5 MG tablet Commonly known as: DELTASONE  Take 1 tablet (5 mg total) by mouth daily with breakfast.   PRENATAL VITAMINS PO Take 1 tablet by mouth in the morning.   rivaroxaban  20 MG Tabs tablet Commonly known as: Xarelto  Take 1 tablet (20 mg total) by mouth daily with supper. Start taking on: August 26, 2023 What changed: These instructions start on August 26, 2023. If you are unsure what to do until then, ask your doctor or other care provider.   sodium bicarbonate  650 MG tablet Take 650 mg by mouth daily.   sodium chloride  HYPERTONIC 3 % nebulizer solution Take 4 mLs by nebulization at bedtime.   Trelegy Ellipta  200-62.5-25  MCG/ACT Aepb Generic drug: Fluticasone -Umeclidin-Vilant INHALE 1 INHALATION INTO THE LUNGS IN THE MORNING. What changed: when to take this   Vitamin D3 25 MCG (1000  UT) Caps Take 1,000 Units by mouth at bedtime.   Voltaren  1 % Gel Generic drug: diclofenac  Sodium Apply 1 Application topically as needed (pain).               Home Infusion Instuctions  (From admission, onward)           Start     Ordered   08/22/23 0000  Home infusion instructions       Question:  Instructions  Answer:  Flushing of vascular access device: 0.9% NaCl pre/post medication administration and prn patency; Heparin  100 u/ml, 5ml for implanted ports and Heparin  10u/ml, 5ml for all other central venous catheters.   08/22/23 9047            Contact information for follow-up providers     [facility] Follow up.   Specialty: Home Health Services Why: Registered Nurse, Aide, Physical and Occupational Therapy-office to call with visit times. Contact information: [address] [place] [address] [phone]              Contact information for after-discharge care     Destination     [facility] .   Service: Skilled Nursing Contact information: [address] [place]  [address] [phone]                    Allergies  Allergen Reactions   Baclofen  Anaphylaxis and Other (See Comments)    Altered mental status requiring a 3-day hospital stay- patient became unresponsive   Put me in a coma for five days, Altered mental status requiring a 3-day hospital stay- patient became unresponsive   Other Shortness Of Breath and Other (See Comments)    Grass and weeds sneezing; filled sinuses (02/20/2012)   Pork-Derived Products Anaphylaxis   Shellfish Allergy  Anaphylaxis, Shortness Of Breath, Itching and Rash   Shrimp Extract Anaphylaxis and Other (See Comments)    ALL SHELLFISH   Tetracycline Hcl Nausea And  Vomiting   Xolair  [Omalizumab ] Other (See Comments)    Caused Blood clot   Zoledronic  Acid Other (See Comments)    Reclast - Fever, Put in hospital, dr said it was a reaction from a reaction    Fever, inability to walk, Put in hospital  Fever, Put in hospital, dr said it was a reaction from a reaction , Other reaction(s): Unknown   Celecoxib Swelling    Feet swelling   Dilaudid  [Hydromorphone  Hcl] Itching   Hydrocodone -Acetaminophen  Itching   Levofloxacin  Other (See Comments)    GI upset   Molds & Smuts Cough   Morphine  And Codeine Hives and Itching   Oxycodone  Hcl Nausea And Vomiting    GI Intolerance   Paroxetine Nausea And Vomiting and Other (See Comments)    GI Intolerance also   Tetracycline Hcl Other (See Comments)    GI Intolerance   Oxycodone -Acetaminophen  Itching   Diltiazem  Hcl Swelling   Dust Mite Extract Other (See Comments) and Cough    sneezing (02/20/2012) too   Gamunex [Immune Globulin ] Itching and Rash    Tolerates hizentra     Rituximab  Other (See Comments)    Anemia    Rofecoxib Swelling    Vioxx- feet swelling   Tree Extract Itching    tested and told I was allergic to it; never experienced a reaction to it (02/20/2012)    You were cared for by a hospitalist during your hospital stay. If you have any questions about your discharge medications  or the care you received while you were in the hospital after you are discharged, you can call the unit and asked to speak with the hospitalist on call if the hospitalist that took care of you is not available. Once you are discharged, your primary care physician will handle any further medical issues. Please note that no refills for any discharge medications will be authorized once you are discharged, as it is imperative that you return to your primary care physician (or establish a relationship with a primary care physician if you do not have one) for your aftercare needs so that they can reassess your need for  medications and monitor your lab values.  You were cared for by a hospitalist during your hospital stay. If you have any questions about your discharge medications or the care you received while you were in the hospital after you are discharged, you can call the unit and asked to speak with the hospitalist on call if the hospitalist that took care of you is not available. Once you are discharged, your primary care physician will handle any further medical issues. Please note that NO REFILLS for any discharge medications will be authorized once you are discharged, as it is imperative that you return to your primary care physician (or establish a relationship with a primary care physician if you do not have one) for your aftercare needs so that they can reassess your need for medications and monitor your lab values.  Please request your Prim.MD to go over all Hospital Tests and Procedure/Radiological results at the follow up, please get all Hospital records sent to your Prim MD by signing hospital release before you go home.  Get CBC, CMP, 2 view Chest X ray checked  by Primary MD during your next visit or SNF MD in 5-7 days ( we routinely change or add medications that can affect your baseline labs and fluid status, therefore we recommend that you get the mentioned basic workup next visit with your PCP, your PCP may decide not to get them or add new tests based on their clinical decision)  On your next visit with your primary care physician please Get Medicines reviewed and adjusted.  If you experience worsening of your admission symptoms, develop shortness of breath, life threatening emergency, suicidal or homicidal thoughts you must seek medical attention immediately by calling 911 or calling your MD immediately  if symptoms less severe.  You Must read complete instructions/literature along with all the possible adverse reactions/side effects for all the Medicines you take and that have been prescribed  to you. Take any new Medicines after you have completely understood and accpet all the possible adverse reactions/side effects.   Do not drive, operate heavy machinery, perform activities at heights, swimming or participation in water  activities or provide baby sitting services if your were admitted for syncope or siezures until you have seen by Primary MD or a Neurologist and advised to do so again.  Do not drive when taking Pain medications.   Procedures/Studies: DG Chest 2 View Result Date: 08/22/2023 CLINICAL DATA:  Pacemaker infection. EXAM: CHEST - 2 VIEW COMPARISON:  08/06/2023.  Chest CT 07/31/2023. FINDINGS: The cardio pericardial silhouette is enlarged. Streaky and patchy opacity at the lung bases compatible with atelectasis. Irregular density in the right mid lung corresponding to an area of architectural distortion and parenchymal scarring described in the right upper lobe on recent chest CT. No pulmonary edema or substantial pleural effusion. Telemetry leads overlie the chest. IMPRESSION:  1. Streaky and patchy bibasilar opacities compatible with atelectasis. 2. Irregular density in the right mid lung corresponding to an area of architectural distortion and parenchymal scarring described in the right upper lobe on recent chest CT. Electronically Signed   By: Camellia Candle M.D.   On: 08/22/2023 07:53   ECHO INTRAOPERATIVE TEE Result Date: 08/21/2023  *INTRAOPERATIVE TRANSESOPHAGEAL REPORT *  Patient Name:   Ebony Scott   Date of Exam: 08/21/2023 Medical Rec #:  [id number]      Height:       62.0 in Accession #:    [id number]     Weight:       122.4 lb Date of Birth:  09-02-1950       BSA:          1.55 m Patient Age:    73 years       BP:           137/72 mmHg Patient Gender: F              HR:           71 bpm. Exam Location:  Anesthesiology Transesophogeal exam was perform intraoperatively during surgical procedure. Patient was closely monitored under general anesthesia during the entirety of  examination. Indications:     Lead Extraction Sonographer:     Tinnie Barefoot RDCS Performing Phys: [id number] T TAYLOR Diagnosing Phys: Elsie Needle MD Complications: No known complications during this procedure. POST-OP IMPRESSIONS Overall, there were no significant changes from pre-bypass. The pericerdial effusion remains trivial and the TR may be slightly increased, but not significantly. PRE-OP FINDINGS  Left Ventricle: The left ventricle has severely reduced systolic function, with an ejection fraction of 20%. The cavity size was moderately dilated. Left ventricular diffuse hypokinesis. Right Ventricle: The right ventricle has mildly reduced systolic function. The cavity was normal. There is no increase in right ventricular wall thickness. Left Atrium: Left atrial size was not assessed. No left atrial/left atrial appendage thrombus was detected. Right Atrium: Right atrial size was not assessed. Interatrial Septum: No atrial level shunt detected by color flow Doppler. Pericardium: Trivial pericardial effusion is present. The pericardial effusion is localized near the right ventricle. Mitral Valve: The mitral valve is normal in structure. Mitral valve regurgitation is trivial by color flow Doppler. Tricuspid Valve: The tricuspid valve was normal in structure. Tricuspid valve regurgitation is mild by color flow Doppler. Aortic Valve: The aortic valve is tricuspid Aortic valve regurgitation is trivial by color flow Doppler. Pulmonic Valve: The pulmonic valve was normal in structure. Pulmonic valve regurgitation is not visualized by color flow Doppler.  Elsie Needle MD Electronically signed by Elsie Needle MD Signature Date/Time: 08/21/2023/11:21:30 AM    Final    EP PPM/ICD IMPLANT Result Date: 08/21/2023 Conclusion: Successful extraction of a single-chamber ICD in a patient with enterococcal sepsis with no immediate procedural complications. Danelle Birmingham, MD   US  EKG SITE RITE Result Date:  08/14/2023 If Site Rite image not attached, placement could not be confirmed due to current cardiac rhythm.  ECHO TEE Result Date: 08/12/2023    TRANSESOPHOGEAL ECHO REPORT   Patient Name:   Ebony Scott Date of Exam: 08/12/2023 Medical Rec #:  [id number]    Height:       62.0 in Accession #:    [id number]   Weight:       122.4 lb Date of Birth:  Aug 08, 1950     BSA:  1.551 m Patient Age:    73 years     BP:           146/61 mmHg Patient Gender: F            HR:           70 bpm. Exam Location:  Inpatient Procedure: Transesophageal Echo, Cardiac Doppler and Color Doppler (Both            Spectral and Color Flow Doppler were utilized during procedure). Indications:    Endocarditis  History:        Patient has prior history of Echocardiogram examinations, most                 recent 08/08/2023. Risk Factors:Hypertension.  Sonographer:    Philomena Daring Referring Phys: [id number] [facility] PROCEDURE: After discussion of the risks and benefits of a TEE, an informed consent was obtained from the patient. The transesophogeal probe was passed without difficulty through the esophogus of the patient. Imaged were obtained with the patient in a left lateral decubitus position. Sedation performed by different physician. Image quality was good. The patient's vital signs; including heart rate, blood pressure, and oxygen  saturation; remained stable throughout the procedure. The patient developed no  complications during the procedure.  IMPRESSIONS  1. Akinesis of the distal anteroseptal wall and apex with overall severe LV dysfunction; no vegetations noted on RV lead or valves.  2. Left ventricular ejection fraction, by estimation, is 25 to 30%. The left ventricle has severely decreased function. The left ventricle demonstrates regional wall motion abnormalities (see scoring diagram/findings for description). The left ventricular internal cavity size was mildly dilated.  3. Right ventricular systolic function is normal. The  right ventricular size is normal.  4. Left atrial size was moderately dilated. No left atrial/left atrial appendage thrombus was detected.  5. The mitral valve is normal in structure. Trivial mitral valve regurgitation.  6. The aortic valve is tricuspid. Aortic valve regurgitation is trivial.  7. There is Moderate (Grade III) plaque involving the descending aorta. Conclusion(s)/Recommendation(s): Normal biventricular function without evidence of hemodynamically significant valvular heart disease. FINDINGS  Left Ventricle: Left ventricular ejection fraction, by estimation, is 25 to 30%. The left ventricle has severely decreased function. The left ventricle demonstrates regional wall motion abnormalities. The left ventricular internal cavity size was mildly  dilated. Right Ventricle: The right ventricular size is normal. Right ventricular systolic function is normal. Left Atrium: Left atrial size was moderately dilated. No left atrial/left atrial appendage thrombus was detected. Right Atrium: Right atrial size was normal in size. Pericardium: There is no evidence of pericardial effusion. Mitral Valve: The mitral valve is normal in structure. Trivial mitral valve regurgitation. Tricuspid Valve: The tricuspid valve is normal in structure. Tricuspid valve regurgitation is trivial. Aortic Valve: The aortic valve is tricuspid. Aortic valve regurgitation is trivial. Pulmonic Valve: The pulmonic valve was normal in structure. Pulmonic valve regurgitation is not visualized. No evidence of pulmonic stenosis. Aorta: The aortic root is normal in size and structure. There is moderate (Grade III) plaque involving the descending aorta. IAS/Shunts: No atrial level shunt detected by color flow Doppler. Additional Comments: Akinesis of the distal anteroseptal wall and apex with overall severe LV dysfunction; no vegetations noted on RV lead or valves. A device lead is visualized.  AORTA Ao Root diam: 2.50 cm Ao Asc diam:  2.90 cm Redell Shallow MD Electronically signed by Redell Shallow MD Signature Date/Time: 08/12/2023/12:21:12 PM    Final  EP STUDY Result Date: 08/12/2023 See surgical note for result.  CT CHEST WO CONTRAST Result Date: 08/09/2023 CLINICAL DATA:  Bronchiectasis, recurrent pneumonia EXAM: CT CHEST WITHOUT CONTRAST TECHNIQUE: Multidetector CT imaging of the chest was performed following the standard protocol without IV contrast. RADIATION DOSE REDUCTION: This exam was performed according to the departmental dose-optimization program which includes automated exposure control, adjustment of the mA and/or kV according to patient size and/or use of iterative reconstruction technique. COMPARISON:  05/18/2023 FINDINGS: Cardiovascular: Extensive multi-vessel coronary artery calcification long segment left anterior descending coronary artery stenting. Left subclavian single lead pacemaker in place with lead within the right ventricle toward the apex. Global cardiac size within normal limits. No pericardial effusion. Central pulmonary arteries are of normal caliber. Moderate atherosclerotic calcification within the thoracic aorta. No aortic aneurysm. Mediastinum/Nodes: No enlarged mediastinal or axillary lymph nodes. Thyroid  gland, trachea, and esophagus demonstrate no significant findings. Lungs/Pleura: Scattered areas of bronchiectasis, architectural distortion, and parenchymal scarring are seen within the right upper lobe, and right lower lobe the areas of previously noted consolidation in keeping with mild post inflammatory scarring. There is persistent mild peribronchial nodularity and ground-glass infiltrate within the right lower lobe in the area prior consolidation in keeping with a small amount of residual inflammatory change however, this is significantly improved since prior examination in keeping with a resolving infectious or inflammatory process. New areas of tree in bud peribronchial nodularity and peripheral  consolidation are noted within the medial left upper lobe (41/5) in keeping with an acute infectious or inflammatory process. No pneumothorax or pleural effusion. Upper Abdomen: No acute abnormality. Musculoskeletal: No acute bone abnormality. Postsurgical changes are noted within the left breast. Right total shoulder arthroplasty has been performed. IMPRESSION: 1. New areas of tree-in-bud peribronchial nodularity and peripheral consolidation within the medial left upper lobe in keeping with an acute infectious or inflammatory process. 2. Improving right lower lobe consolidation in keeping with a resolving infectious or inflammatory process. 3. Extensive multi-vessel coronary artery calcification with evidence of long segment left anterior descending coronary artery stenting. Aortic Atherosclerosis (ICD10-I70.0). Electronically Signed   By: Dorethia Molt M.D.   On: 08/09/2023 00:35   ECHOCARDIOGRAM COMPLETE Result Date: 08/08/2023    ECHOCARDIOGRAM REPORT   Patient Name:   Ebony Scott Date of Exam: 08/08/2023 Medical Rec #:  [id number]    Height:       62.0 in Accession #:    [id number]   Weight:       130.1 lb Date of Birth:  May 06, 1950     BSA:          1.592 m Patient Age:    73 years     BP:           125/67 mmHg Patient Gender: F            HR:           55 bpm. Exam Location:  Inpatient Procedure: 2D Echo, Intracardiac Opacification Agent, Cardiac Doppler and Color            Doppler (Both Spectral and Color Flow Doppler were utilized during            procedure). Indications:    Endocarditis  History:        Patient has prior history of Echocardiogram examinations, most                 recent 07/17/2022. CHF, CAD, Chronic Kidney Disease; Risk  Factors:Dyslipidemia.  Sonographer:    Koleen Popper RDCS Referring Phys: [id number] [facility]  Sonographer Comments: Suboptimal apical window. IMPRESSIONS  1. Left ventricular ejection fraction, by estimation, is 25 to 30%. The left ventricle has  severely decreased function. The left ventricle demonstrates regional wall motion abnormalities (see scoring diagram/findings for description). There is moderate asymmetric left ventricular hypertrophy of the basal-septal segment. Left ventricular diastolic parameters are consistent with Grade I diastolic dysfunction (impaired relaxation).  2. Right ventricular systolic function is normal. The right ventricular size is normal. There is normal pulmonary artery systolic pressure.  3. Left atrial size was moderately dilated.  4. The mitral valve is normal in structure. No evidence of mitral valve regurgitation. No evidence of mitral stenosis.  5. The aortic valve is tricuspid. There is mild calcification of the aortic valve. Aortic valve regurgitation is trivial. No aortic stenosis is present.  6. The inferior vena cava is normal in size with greater than 50% respiratory variability, suggesting right atrial pressure of 3 mmHg. FINDINGS  Left Ventricle: Left ventricular ejection fraction, by estimation, is 25 to 30%. The left ventricle has severely decreased function. The left ventricle demonstrates regional wall motion abnormalities. The left ventricular internal cavity size was normal  in size. There is moderate asymmetric left ventricular hypertrophy of the basal-septal segment. Left ventricular diastolic parameters are consistent with Grade I diastolic dysfunction (impaired relaxation).  LV Wall Scoring: The entire anterior septum, mid and distal inferior wall, mid inferoseptal segment, apical anterior segment, and apex are akinetic. Right Ventricle: The right ventricular size is normal. No increase in right ventricular wall thickness. Right ventricular systolic function is normal. There is normal pulmonary artery systolic pressure. The tricuspid regurgitant velocity is 2.33 m/s, and  with an assumed right atrial pressure of 3 mmHg, the estimated right ventricular systolic pressure is 24.7 mmHg. Left Atrium: Left  atrial size was moderately dilated. Right Atrium: Right atrial size was normal in size. Pericardium: There is no evidence of pericardial effusion. Mitral Valve: The mitral valve is normal in structure. No evidence of mitral valve regurgitation. No evidence of mitral valve stenosis. Tricuspid Valve: The tricuspid valve is normal in structure. Tricuspid valve regurgitation is trivial. No evidence of tricuspid stenosis. Aortic Valve: The aortic valve is tricuspid. There is mild calcification of the aortic valve. Aortic valve regurgitation is trivial. Aortic regurgitation PHT measures 460 msec. No aortic stenosis is present. Aortic valve mean gradient measures 12.0 mmHg.  Aortic valve peak gradient measures 20.8 mmHg. Aortic valve area, by VTI measures 0.75 cm. Pulmonic Valve: The pulmonic valve was normal in structure. Pulmonic valve regurgitation is not visualized. No evidence of pulmonic stenosis. Aorta: The aortic root is normal in size and structure. Venous: The inferior vena cava is normal in size with greater than 50% respiratory variability, suggesting right atrial pressure of 3 mmHg. IAS/Shunts: No atrial level shunt detected by color flow Doppler. Additional Comments: A device lead is visualized.  LEFT VENTRICLE PLAX 2D LVIDd:         5.60 cm   Diastology LVIDs:         3.05 cm   LV e' medial:    4.33 cm/s LV PW:         0.70 cm   LV E/e' medial:  18.7 LV IVS:        1.10 cm   LV e' lateral:   4.28 cm/s LVOT diam:     1.50 cm   LV E/e' lateral: 18.9 LV  SV:         42 LV SV Index:   26 LVOT Area:     1.77 cm  RIGHT VENTRICLE            IVC RV S prime:     9.90 cm/s  IVC diam: 1.80 cm LEFT ATRIUM             Index LA diam:        3.30 cm 2.07 cm/m LA Vol (A2C):   47.5 ml 29.83 ml/m LA Vol (A4C):   28.0 ml 17.59 ml/m LA Biplane Vol: 39.5 ml 24.81 ml/m  AORTIC VALVE AV Area (Vmax):    0.76 cm AV Area (Vmean):   0.73 cm AV Area (VTI):     0.75 cm AV Vmax:           228.00 cm/s AV Vmean:          157.000  cm/s AV VTI:            0.557 m AV Peak Grad:      20.8 mmHg AV Mean Grad:      12.0 mmHg LVOT Vmax:         97.50 cm/s LVOT Vmean:        64.500 cm/s LVOT VTI:          0.237 m LVOT/AV VTI ratio: 0.43 AI PHT:            460 msec  AORTA Ao Root diam: 2.70 cm Ao Asc diam:  3.10 cm MITRAL VALVE               TRICUSPID VALVE MV Area (PHT): 3.12 cm    TR Peak grad:   21.7 mmHg MV Decel Time: 243 msec    TR Vmax:        233.00 cm/s MR Peak grad: 64.6 mmHg MR Vmax:      402.00 cm/s  SHUNTS MV E velocity: 81.00 cm/s  Systemic VTI:  0.24 m MV A velocity: 93.80 cm/s  Systemic Diam: 1.50 cm MV E/A ratio:  0.86 Toribio Fuel MD Electronically signed by Toribio Fuel MD Signature Date/Time: 08/08/2023/4:21:57 PM    Final    DG Chest Port 1 View Result Date: 08/06/2023 CLINICAL DATA:  Sepsis. Fatigue and weakness. Altered mental status. Fever. Productive cough. EXAM: PORTABLE CHEST 1 VIEW COMPARISON:  05/18/2023 FINDINGS: AICD noted. Wrist right shoulder arthroplasty. Postoperative findings in the cervical spine. Atherosclerotic calcification of the aortic arch. Heart size within normal limits. 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 Reticulonodular opacity laterally in the right upper lobe, possibly from atelectasis or atypical infectious bronchiolitis. Lateral calcifications in the left breast. No blunting of the costophrenic angles. IMPRESSION: 1. Reticulonodular opacity laterally in the right upper lobe, possibly from atelectasis or atypical infectious bronchiolitis. 2. AICD. 3. 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 4. Lateral calcifications in the left breast. Electronically Signed   By: Ryan Salvage M.D.   On: 08/06/2023 17:00     The results of significant diagnostics from this hospitalization (including imaging, microbiology, ancillary and laboratory) are listed below for reference.     Microbiology: Recent Results (from the past 240 hours)  Surgical pcr screen      Status: None   Collection Time: 08/20/23 12:01 PM   Specimen: Nasal Mucosa; Nasal Swab  Result Value Ref Range Status   MRSA, PCR NEGATIVE NEGATIVE Final   Staphylococcus aureus NEGATIVE NEGATIVE Final    Comment: (NOTE) The Xpert  SA Assay (FDA approved for NASAL specimens in patients 13 years of age and older), is one component of a comprehensive surveillance program. It is not intended to diagnose infection nor to guide or monitor treatment. Performed at [facility], [address], [place], [place] [address]      Labs: BNP (last 3 results) Recent Labs    03/31/23 0508 04/01/23 0428 05/18/23 1331  BNP 1,953.7* 1,916.4* 302.6*   Basic Metabolic Panel: Recent Labs  Lab 08/17/23 0708 08/18/23 0708 08/19/23 0435 08/21/23 0500 08/22/23 0500  NA 143 139 138 138 138  K 4.0 4.0 4.1 4.1 4.0  CL 108 106 104 105 108  CO2 23 25 22 23  21*  GLUCOSE 84 85 83 88 85  BUN 25* 20 18 20 22   CREATININE 1.77* 1.55* 1.41* 1.45* 1.61*  CALCIUM  8.5* 8.3* 8.5* 8.5* 8.3*  MG 1.8  --   --   --  2.1  PHOS  --   --   --   --  3.2   Liver Function Tests: No results for input(s): AST, ALT, ALKPHOS, BILITOT, PROT, ALBUMIN in the last 168 hours. No results for input(s): LIPASE, AMYLASE in the last 168 hours. No results for input(s): AMMONIA in the last 168 hours. CBC: Recent Labs  Lab 08/15/23 1836 08/20/23 0704 08/21/23 0500 08/21/23 2020  WBC 12.4* 8.9 9.3 8.7  HGB 9.8* 9.2* 9.1* 8.6*  HCT 30.4* 29.2* 28.9* 27.8*  MCV 99.0 102.1* 100.7* 101.8*  PLT 179 182 187 166   Cardiac Enzymes: No results for input(s): CKTOTAL, CKMB, CKMBINDEX, TROPONINI in the last 168 hours. BNP: Invalid input(s): POCBNP CBG: Recent Labs  Lab 08/20/23 2122 08/21/23 1148 08/21/23 1604 08/21/23 2151 08/22/23 0823  GLUCAP 103* 92 211* 162* 195*   D-Dimer No results for input(s): DDIMER in the last 72 hours. Hgb A1c No results for input(s): HGBA1C in the  last 72 hours. Lipid Profile No results for input(s): CHOL, HDL, LDLCALC, TRIG, CHOLHDL, LDLDIRECT in the last 72 hours. Thyroid  function studies No results for input(s): TSH, T4TOTAL, T3FREE, THYROIDAB in the last 72 hours.  Invalid input(s): FREET3 Anemia work up No results for input(s): VITAMINB12, FOLATE, FERRITIN, TIBC, IRON , RETICCTPCT in the last 72 hours. Urinalysis    Component Value Date/Time   COLORURINE YELLOW 08/06/2023 1630   APPEARANCEUR CLEAR 08/06/2023 1630   LABSPEC 1.023 08/06/2023 1630   PHURINE 5.0 08/06/2023 1630   GLUCOSEU >=500 (A) 08/06/2023 1630   HGBUR NEGATIVE 08/06/2023 1630   BILIRUBINUR NEGATIVE 08/06/2023 1630   KETONESUR NEGATIVE 08/06/2023 1630   PROTEINUR NEGATIVE 08/06/2023 1630   UROBILINOGEN 0.2 05/17/2014 1113   NITRITE NEGATIVE 08/06/2023 1630   LEUKOCYTESUR NEGATIVE 08/06/2023 1630   Sepsis Labs Recent Labs  Lab 08/15/23 1836 08/20/23 0704 08/21/23 0500 08/21/23 2020  WBC 12.4* 8.9 9.3 8.7   Microbiology Recent Results (from the past 240 hours)  Surgical pcr screen     Status: None   Collection Time: 08/20/23 12:01 PM   Specimen: Nasal Mucosa; Nasal Swab  Result Value Ref Range Status   MRSA, PCR NEGATIVE NEGATIVE Final   Staphylococcus aureus NEGATIVE NEGATIVE Final    Comment: (NOTE) The Xpert SA Assay (FDA approved for NASAL specimens in patients 41 years of age and older), is one component of a comprehensive surveillance program. It is not intended to diagnose infection nor to guide or monitor treatment. Performed at [facility], [address] [place], [place] [address]  Time coordinating discharge:  I have spent 35 minutes face to face with the patient and on the ward discussing the patients care, assessment, plan and disposition with other care givers. >50% of the time was devoted counseling the patient about the risks and benefits of treatment/Discharge disposition and  coordinating care.   SIGNED:   Burgess JAYSON Dare, MD  [facility] 08/22/2023, 11:26 AM   If 7PM-7AM, please contact night-coverage

## 2023-08-22 NOTE — Telephone Encounter (Signed)
 Spoke with daughter Charlies in regards to scheduling an immunotherapy visit to use a room for patients infusion. Daughter is reaching out to patient home health nurse to see when is best for him to come into the office. Charlies will call me back when she has this information. Please get me when [name] calls back. Thank you.

## 2023-08-22 NOTE — Care Management Important Message (Signed)
 Important Message  Patient Details  Name: Ebony Scott MRN: [id number] Date of Birth: 04-24-1950   Important Message Given:  Yes - Medicare IM     [name] 08/22/2023, 11:09 AM

## 2023-08-22 NOTE — TOC Transition Note (Signed)
 Transition of Care [facility]) - Discharge Note   Patient Details  Name: Ebony Scott MRN: [id number] Date of Birth: 10-13-1950  [facility] CM/SW Contact:  Isaiah Public, LCSWA Phone Number: 08/22/2023, 11:56 AM   Clinical Narrative:     Patient will DC to: [facility]  Anticipated DC date: 08/22/2023  Family notified: [name]  Transport by: [facility]  ?  Per MD patient ready for DC to [facility] . RN, patient, patient's family, and facility notified of DC. Discharge Summary sent to facility. RN given number for report [phone] RM# [phone]. DC packet on chart. DNR signed by MD attached to patients DC packet. Ambulance transport requested for patient.  CSW signing off.   Final next level of care: Skilled Nursing Facility Barriers to Discharge: No Barriers Identified   Patient Goals and CMS Choice Patient states their goals for this hospitalization and ongoing recovery are:: SNF CMS Medicare.gov Compare Post Acute Care list provided to:: Patient Choice offered to / list presented to : Patient [place] ownership interest in [facility].provided to:: Parent NA    Discharge Placement              Patient chooses bed at: [facility] Patient to be transferred to facility by: [facility] Name of family member notified: [name] Patient and family notified of of transfer: 08/22/23  Discharge Plan and Services Additional resources added to the After Visit Summary for   In-house Referral: NA Discharge Planning Services: CM Consult Post Acute Care Choice: Home Health, Resumption of Svcs/PTA Provider          DME Arranged: IV pump/equipment DME Agency:  [facility]) Date DME Agency Contacted: 08/14/23 Time DME Agency Contacted: 1154 Representative spoke with at DME Agency: Pam HH Arranged: RN, Disease Management, PT, OT, Nurse's Aide HH Agency: [facility] Date [facility] Agency Contacted: 08/14/23 Time HH Agency Contacted: 1159 Representative spoke with at [facility] Agency:  Arna  Social Drivers of Health (SDOH) Interventions SDOH Screenings   Food Insecurity: No Food Insecurity (08/07/2023)  Housing: Low Risk  (08/07/2023)  Transportation Needs: No Transportation Needs (08/07/2023)  Utilities: Not At Risk (08/07/2023)  Alcohol  Screen: Low Risk  (11/22/2022)  Depression (PHQ2-9): Low Risk  (05/29/2023)  Financial Resource Strain: Low Risk  (03/26/2023)  Physical Activity: Inactive (03/26/2023)  Social Connections: Socially Isolated (08/07/2023)  Stress: Stress Concern Present (03/26/2023)  Tobacco Use: Low Risk  (08/12/2023)  Health Literacy: Adequate Health Literacy (11/22/2022)     Readmission Risk Interventions    08/14/2023   11:49 AM 05/20/2023   12:38 PM 12/06/2022    2:19 PM  Readmission Risk Prevention Plan  Transportation Screening Complete Complete Complete  PCP or Specialist Appt within 3-5 Days   Complete  HRI or Home Care Consult   Complete  Social Work Consult for Recovery Care Planning/Counseling   Complete  Palliative Care Screening   Complete  Medication Review Oceanographer) Referral to Pharmacy Complete Complete  PCP or Specialist appointment within 3-5 days of discharge  Complete   HRI or Home Care Consult Complete Complete   SW Recovery Care/Counseling Consult Complete Complete   Palliative Care Screening Not Applicable Not Applicable   Skilled Nursing Facility Not Applicable Complete

## 2023-08-22 NOTE — Telephone Encounter (Signed)
 Patient daughter called to advise patient back in hospital and going to rehab. Needs infusion outside of rehab due to [facility] guidelines. Spoke to Dr Kozlow and he is fine with home health coming into [place] clinic to infuse if needed. We do need to address continued infections and possibly change in therapy. L/m for daughter to call me back to discuss same.

## 2023-08-22 NOTE — Progress Notes (Signed)
 PT Cancellation Note  Patient Details Name: Ebony Scott MRN: [id number] DOB: 08/01/1950   Cancelled Treatment:    Reason Eval/Treat Not Completed: Fatigue/lethargy limiting ability to participate. Pt very lethargic on PT arrival, unable to keep eyes open for more than a second or two at a time. Pt declining any mobility at this time. Per chart review, pt also d/c'ing to SNF today. PT will continue to follow-up with pt acutely as available and appropriate.    Delon [name] 08/22/2023, 12:03 PM

## 2023-08-22 NOTE — Progress Notes (Addendum)
 Rounding Note   Patient Name: Ebony Scott Date of Encounter: 08/22/2023  [facility] Cardiologist: Shelda Bruckner, MD   Subjective  Sore at the extraction site, no CP, denies SOB  Scheduled Meds:  acidophilus  1 capsule Oral TID   atorvastatin   80 mg Oral QPM   benzonatate   200 mg Oral TID   budesonide -glycopyrrolate -formoterol   2 puff Inhalation BID   Chlorhexidine  Gluconate Cloth  6 each Topical Daily   cholecalciferol   1,000 Units Oral QHS   empagliflozin   10 mg Oral Daily   fluticasone   2 spray Each Nare Daily   folic acid   1 mg Oral Daily   gabapentin   300 mg Oral QHS   insulin  aspart  0-5 Units Subcutaneous QHS   insulin  aspart  0-9 Units Subcutaneous TID WC   nystatin  cream   Topical TID   pantoprazole   40 mg Oral Daily   predniSONE   5 mg Oral Q breakfast   sacubitril -valsartan   1 tablet Oral BID   sodium chloride  flush  10-40 mL Intracatheter Q12H   Continuous Infusions:  ampicillin  (OMNIPEN) IV 2 g (08/22/23 0600)   cefTRIAXone  (ROCEPHIN )  IV 2 g (08/21/23 2105)   PRN Meds: acetaminophen  **OR** acetaminophen , guaiFENesin -dextromethorphan , hydrALAZINE , ipratropium-albuterol , loperamide , metoprolol tartrate, [DISCONTINUED] ondansetron  **OR** ondansetron  (ZOFRAN ) IV, senna-docusate, sodium chloride  flush   Vital Signs  Vitals:   08/21/23 1936 08/21/23 2015 08/22/23 0019 08/22/23 0500  BP: (!) 100/40  (!) 113/57 105/67  Pulse: 66  70 70  Resp: 17  16 15   Temp: 98.3 F (36.8 C)  99.2 F (37.3 C) 99.3 F (37.4 C)  TempSrc: Oral  Oral Oral  SpO2: 94% 96% 98% 93%  Weight:      Height:        Intake/Output Summary (Last 24 hours) at 08/22/2023 0817 Last data filed at 08/22/2023 0500 Gross per 24 hour  Intake 785 ml  Output 1475 ml  Net -690 ml      08/09/2023   10:54 PM 08/07/2023   11:00 PM 08/02/2023    2:12 PM  Last 3 Weights  Weight (lbs) 122 lb 5.7 oz 130 lb 1.1 oz 130 lb  Weight (kg) 55.5 kg 59 kg 58.968 kg      Telemetry SR  mostly 60's, infrequent PVCs - Personally Reviewed  ECG  SR  70bpm presume PVC 1st beat - Personally Reviewed  Physical Exam  GEN: No acute distress, chronically ill appearing.   Neck: No JVD Cardiac: RRR, no murmurs, rubs, or gallops.  Respiratory:  CTA b/l. GI: Soft, nontender, non-distended  MS: No edema; No deformity. Neuro:  Nonfocal  Psych: Normal affect   L chest: extraction site: stable, no bleeding, hematoma, drainage  Labs High Sensitivity Troponin:  No results for input(s): TROPONINIHS in the last 720 hours.   Chemistry Recent Labs  Lab 08/17/23 0708 08/18/23 0708 08/19/23 0435 08/21/23 0500 08/22/23 0500  NA 143   < > 138 138 138  K 4.0   < > 4.1 4.1 4.0  CL 108   < > 104 105 108  CO2 23   < > 22 23 21*  GLUCOSE 84   < > 83 88 85  BUN 25*   < > 18 20 22   CREATININE 1.77*   < > 1.41* 1.45* 1.61*  CALCIUM  8.5*   < > 8.5* 8.5* 8.3*  MG 1.8  --   --   --  2.1  GFRNONAA 30*   < > 39*  38* 33*  ANIONGAP 12   < > 12 10 9    < > = values in this interval not displayed.    Lipids No results for input(s): CHOL, TRIG, HDL, LABVLDL, LDLCALC, CHOLHDL in the last 168 hours.  Hematology Recent Labs  Lab 08/20/23 0704 08/21/23 0500 08/21/23 2020  WBC 8.9 9.3 8.7  RBC 2.86* 2.87* 2.73*  HGB 9.2* 9.1* 8.6*  HCT 29.2* 28.9* 27.8*  MCV 102.1* 100.7* 101.8*  MCH 32.2 31.7 31.5  MCHC 31.5 31.5 30.9  RDW 14.9 15.1 15.4  PLT 182 187 166   Thyroid  No results for input(s): TSH, FREET4 in the last 168 hours.  BNPNo results for input(s): BNP, PROBNP in the last 168 hours.  DDimer No results for input(s): DDIMER in the last 168 hours.   Radiology  DG Chest 2 View Result Date: 08/22/2023 CLINICAL DATA:  Pacemaker infection. EXAM: CHEST - 2 VIEW COMPARISON:  08/06/2023.  Chest CT 07/31/2023. FINDINGS: The cardio pericardial silhouette is enlarged. Streaky and patchy opacity at the lung bases compatible with atelectasis. Irregular density in the right  mid lung corresponding to an area of architectural distortion and parenchymal scarring described in the right upper lobe on recent chest CT. No pulmonary edema or substantial pleural effusion. Telemetry leads overlie the chest. IMPRESSION: 1. Streaky and patchy bibasilar opacities compatible with atelectasis. 2. Irregular density in the right mid lung corresponding to an area of architectural distortion and parenchymal scarring described in the right upper lobe on recent chest CT. Electronically Signed   By: Camellia Candle M.D.   On: 08/22/2023 07:53    Cardiac Studies  08/21/23: intra-procedure TEE POST-OP IMPRESSIONS  Overall, there were no significant changes from pre-bypass. The  pericerdial  effusion remains trivial and the TR may be slightly increased, but not  significantly.   PRE-OP FINDINGS   Left Ventricle: The left ventricle has severely reduced systolic  function, with an ejection fraction of 20%. The cavity size was moderately  dilated. Left ventricular diffuse hypokinesis.  Right Ventricle: The right ventricle has mildly reduced systolic function.  The cavity was normal. There is no increase in right ventricular wall  thickness.   Left Atrium: Left atrial size was not assessed. No left atrial/left atrial  appendage thrombus was detected.   Right Atrium: Right atrial size was not assessed.   Interatrial Septum: No atrial level shunt detected by color flow Doppler.  Pericardium: Trivial pericardial effusion is present. The pericardial  effusion is localized near the right ventricle.   Mitral Valve: The mitral valve is normal in structure. Mitral valve  regurgitation is trivial by color flow Doppler.   Tricuspid Valve: The tricuspid valve was normal in structure. Tricuspid  valve regurgitation is mild by color flow Doppler.   Aortic Valve: The aortic valve is tricuspid Aortic valve regurgitation is  trivial by color flow Doppler.  Pulmonic Valve: The pulmonic valve was  normal in structure.  Pulmonic valve regurgitation is not visualized by color flow Doppler.   08/08/23: TTE 1. Left ventricular ejection fraction, by estimation, is 25 to 30%. The  left ventricle has severely decreased function. The left ventricle  demonstrates regional wall motion abnormalities (see scoring  diagram/findings for description). There is moderate  asymmetric left ventricular hypertrophy of the basal-septal segment. Left  ventricular diastolic parameters are consistent with Grade I diastolic  dysfunction (impaired relaxation).   2. Right ventricular systolic function is normal. The right ventricular  size is normal. There is normal pulmonary  artery systolic pressure.   3. Left atrial size was moderately dilated.   4. The mitral valve is normal in structure. No evidence of mitral valve  regurgitation. No evidence of mitral stenosis.   5. The aortic valve is tricuspid. There is mild calcification of the  aortic valve. Aortic valve regurgitation is trivial. No aortic stenosis is  present.   6. The inferior vena cava is normal in size with greater than 50%  respiratory variability, suggesting right atrial pressure of 3 mmHg.   08/12/23: TEE 1. Akinesis of the distal anteroseptal wall and apex with overall severe  LV dysfunction; no vegetations noted on RV lead or valves.   2. Left ventricular ejection fraction, by estimation, is 25 to 30%. The  left ventricle has severely decreased function. The left ventricle  demonstrates regional wall motion abnormalities (see scoring  diagram/findings for description). The left  ventricular internal cavity size was mildly dilated.   3. Right ventricular systolic function is normal. The right ventricular  size is normal.   4. Left atrial size was moderately dilated. No left atrial/left atrial  appendage thrombus was detected.   5. The mitral valve is normal in structure. Trivial mitral valve  regurgitation.   6. The aortic valve is  tricuspid. Aortic valve regurgitation is trivial.   7. There is Moderate (Grade III) plaque involving the descending aorta.    12/2020 LHC > severe single vessel CAD with 95 & 90% stenoses of the proximal and mid LAD at major 1st diagonal branch s/p DES PCI of LAD, moderate pulmonary hypertension / pulmonary venous hypertension  06/2022 ECHO > LVEF 30-35%, elevated LVEDP, RWMA, moderate hypokinesis of the LV mid-apical anteroseptal wall, apical segment & inferoapical segment. There is moderate hypokinesis of the LV globally, G1 DD       Patient Profile   73 y.o. female w/PMHx of  DVT/PE, bronchiectasis, OSA, GERD, CAD s/p PCI in 12/2020, RA on chronic steroids  CKD (IIIb) ICM  Chronic CHF (systolic) ICD  ICD was implanted 08/27/22 --- Fairly acutely post implant July 2024 febrile illness > w/negative [facility] --- Oct 2024 admitted with sepsis/CAP  NOW admitted 08/06/23 with severe weakness, fever and chills and was found to have E.Faecalis bacteremia  EP brought on board and planned for ICD system removal Gen cards also on board, with some lower BPs   Assessment & Plan   Bacteremia BC 08/06/23: + Enterococcus faecalis infection Enterococcal bacteremia ID team on  board no GI or GU symptoms  - 6/17 UA negative for infection, strep pneumo, Legionella urine antigen negative - 6/19 blood cx NG in 5 days - 6/19 TTE with no vegetations  - 6/23 TEE no vegetation on RV lead or valves PLAN: Ampicillin  8g daily as a continuous infusion + Rocephin  2g IV every 12 hours  Duration: 6 weeks  End Date: 09/19/23  ICD System removed/extracted yesterday 08/21/23 Site is stable CXR this morning with no report of PTX Site care and activity restrictions reviewed with the patient and provided in her AVS EP follow up is in place At this juncture given her hx of infections and increased risk of recurrent infections, no plans for re-implant No lifevest  Do not resume Xarelto  until Monday 08/26/23 Do not  resume Plavix  until Thursday 08/29/23 (if planned to resume)    Chronic CHF CAD/likely ischemic etiology Appears compensated BPs remain better C/w general cardiology team   Dr. Waddell has been bedside EP team will sign off though remain available,  please recall if needed  Further with attending IM/ID/cards teams    For questions or updates, please contact [facility] Please consult [email or web address] for contact info under     Signed, Charlies Macario Arthur, PA-C  08/22/2023, 8:17 AM   ' EP Attending  Patient seen and examined. Agree with above. The patient is stable after ICD system extraction. She has some typical incisional soreness. No bleeding or hematoma. CV with a RRR and lungs are clear with rare scattered rales. Tele with NSR. A/P Enterococcal sepsis - she is s/p anti-biotics and ICD system extraction. EP will sign off. Continue IV anti-biotics as per ID. Chronic systolic heart failure - continue GDMT. No plans for ICD re-implantation at this time as the risk outweigh the benefits. No indication for Life Vest.   Danelle Toron Bowring,MD

## 2023-08-22 NOTE — Progress Notes (Signed)
 PHARMACY CONSULT NOTE FOR:  OUTPATIENT  PARENTERAL ANTIBIOTIC THERAPY (OPAT)  Indication: E faecalis bacteremia/concern for ICD infection Regimen: Ampicillin  2 gm IV q 8 hours + Ceftriaxone  2 gm IV q 12 hours End date: 09/19/23  IV antibiotic discharge orders are pended. To discharging provider:  please sign these orders via discharge navigator,  Select New Orders & click on the button choice - Manage This Unsigned Work.     Thank you for allowing pharmacy to be a part of this patient's care.  Damien Quiet, PharmD, BCPS, BCIDP Infectious Diseases Clinical Pharmacist Phone: [phone] 08/22/2023, 9:40 AM

## 2023-08-22 NOTE — Progress Notes (Signed)
 Cardiology Progress Note  Patient ID: RANIYA GOLEMBESKI MRN: [id number] DOB: 1950-08-17 Date of Encounter: 08/22/2023 Primary Cardiologist: Shelda Bruckner, MD  Subjective   No complaints procedure went well yesterday   ROS:  All other ROS reviewed and negative. Pertinent positives noted in the HPI.     Telemetry  Overnight telemetry shows SR 70s, which I personally reviewed.    Physical Exam   Vitals:   08/22/23 0019 08/22/23 0500 08/22/23 0825 08/22/23 0925  BP: (!) 113/57 105/67 (!) 103/42 (!) 109/51  Pulse: 70 70 77 69  Resp: 16 15 17    Temp: 99.2 F (37.3 C) 99.3 F (37.4 C) 98.1 F (36.7 C)   TempSrc: Oral Oral Oral   SpO2: 98% 93% 95% 93%  Weight:      Height:        Intake/Output Summary (Last 24 hours) at 08/22/2023 1024 Last data filed at 08/22/2023 0827 Gross per 24 hour  Intake 400 ml  Output 1450 ml  Net -1050 ml       08/09/2023   10:54 PM 08/07/2023   11:00 PM 08/02/2023    2:12 PM  Last 3 Weights  Weight (lbs) 122 lb 5.7 oz 130 lb 1.1 oz 130 lb  Weight (kg) 55.5 kg 59 kg 58.968 kg    Body mass index is 22.38 kg/m.   Affect appropriate Healthy:  appears stated age HEENT: normal Neck supple with no adenopathy JVP normal no bruits no thyromegaly Lungs clear with no wheezing and good diaphragmatic motion Heart:  S1/S2 no murmur post AICD removal with dressing in place Abdomen: benighn, BS positve, no tenderness, no AAA no bruit.  No HSM or HJR Distal pulses intact with no bruits No edema PIC line RUE Neuro non-focal Skin warm and dry No muscular weakness   Cardiac Studies  TTE 08/08/2023  1. Left ventricular ejection fraction, by estimation, is 25 to 30%. The  left ventricle has severely decreased function. The left ventricle  demonstrates regional wall motion abnormalities (see scoring  diagram/findings for description). There is moderate  asymmetric left ventricular hypertrophy of the basal-septal segment. Left  ventricular diastolic  parameters are consistent with Grade I diastolic  dysfunction (impaired relaxation).   2. Right ventricular systolic function is normal. The right ventricular  size is normal. There is normal pulmonary artery systolic pressure.   3. Left atrial size was moderately dilated.   4. The mitral valve is normal in structure. No evidence of mitral valve  regurgitation. No evidence of mitral stenosis.   5. The aortic valve is tricuspid. There is mild calcification of the  aortic valve. Aortic valve regurgitation is trivial. No aortic stenosis is  present.   6. The inferior vena cava is normal in size with greater than 50%  respiratory variability, suggesting right atrial pressure of 3 mmHg.   LHC 01/11/2021 SUMMARY Severe single-vessel CAD with 95% and 90% stenoses of the proximal and mid LAD at major 1st Diag branch.  Likely be culprit for reduced EF and anterior hypokinesis on echo. Successful DES PCI of the LAD using an Onyx Frontier DES 2.25 mm x 34 mm postdilated in tapered fashion from 2.6 to 2.55mm.   TIMI III flow pre and post, reduced to 0% Otherwise minimal CAD in the Right dominant system. 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 Moderate to severely reduced cardiac function with Cardiac  Output-Index of 4.05-2.59 by Fick and 3.75-2.40 by Thermal Dilution.  Patient Profile  FREYJA GOVEA is a 73 y.o. female with systolic heart failure (EF 25-30%), CAD status post PCI to LAD, CKD stage IIIb, DVT/PE, rheumatoid arthritis, bronchiectasis admitted on 08/06/2023 with pneumonia and E faecalis bacteremia.  Assessment & Plan   # Chronic systolic heart failure, EF 25-30% # Suspect ischemic cardiomyopathy - BP more elevated resumed entresto  GDMT limited by this and CRF - She is not on a beta-blocker.  Unclear.  At this time we will hold on this. - ok to  continue jardiance .  - CKD limits aldactone and digoxin. Limited options. CR 1.6 K 4.0  - I/O's negative 1050 - would use PRN lasix  20 mg for weight gain > 3 lbs or edema  # Bacteremia # ICD with plans for extraction - Device extraction 08/22/23 CXR no pneumothorax  - enterococcus faecalis this admission - no plans on replacing device per GT due to multiple past infections - No valvular vegetations on TEE 6/19 and 6/23  - Plan Amp/Rocephin  for 6 weeks ending 09/19/23   # CAD s/p PCI - can continue xarelto  monotherapy  - Dr Lavona d/c plavix  last intervention 2022  - No angina   # CKD 3b - close monitoring of kidney function. Cr 1.6 K 4.0 today  # Bacteremia - per hospital team. On ampicillin  and ceftriaxone   # DVT/PE - per Dr Waddell resume xarelto  Monday 08/26/23 -      For questions or updates, please contact [facility] Please consult [email or web address] for contact info under     Maude Emmer MD [facility]

## 2023-08-23 DIAGNOSIS — M542 Cervicalgia: Secondary | ICD-10-CM | POA: Diagnosis not present

## 2023-08-23 DIAGNOSIS — G894 Chronic pain syndrome: Secondary | ICD-10-CM | POA: Diagnosis not present

## 2023-08-23 DIAGNOSIS — I5032 Chronic diastolic (congestive) heart failure: Secondary | ICD-10-CM | POA: Diagnosis not present

## 2023-08-23 DIAGNOSIS — N183 Chronic kidney disease, stage 3 unspecified: Secondary | ICD-10-CM | POA: Diagnosis not present

## 2023-08-23 DIAGNOSIS — M810 Age-related osteoporosis without current pathological fracture: Secondary | ICD-10-CM | POA: Diagnosis not present

## 2023-08-23 DIAGNOSIS — D529 Folate deficiency anemia, unspecified: Secondary | ICD-10-CM | POA: Diagnosis not present

## 2023-08-23 DIAGNOSIS — I82401 Acute embolism and thrombosis of unspecified deep veins of right lower extremity: Secondary | ICD-10-CM | POA: Diagnosis not present

## 2023-08-23 DIAGNOSIS — J9620 Acute and chronic respiratory failure, unspecified whether with hypoxia or hypercapnia: Secondary | ICD-10-CM | POA: Diagnosis not present

## 2023-08-23 DIAGNOSIS — M797 Fibromyalgia: Secondary | ICD-10-CM | POA: Diagnosis not present

## 2023-08-24 DIAGNOSIS — T827XXA Infection and inflammatory reaction due to other cardiac and vascular devices, implants and grafts, initial encounter: Secondary | ICD-10-CM | POA: Diagnosis not present

## 2023-08-24 DIAGNOSIS — R7881 Bacteremia: Secondary | ICD-10-CM | POA: Diagnosis not present

## 2023-08-24 DIAGNOSIS — R2689 Other abnormalities of gait and mobility: Secondary | ICD-10-CM | POA: Diagnosis not present

## 2023-08-24 DIAGNOSIS — J9610 Chronic respiratory failure, unspecified whether with hypoxia or hypercapnia: Secondary | ICD-10-CM | POA: Diagnosis not present

## 2023-08-24 DIAGNOSIS — M6281 Muscle weakness (generalized): Secondary | ICD-10-CM | POA: Diagnosis not present

## 2023-08-24 DIAGNOSIS — Z792 Long term (current) use of antibiotics: Secondary | ICD-10-CM | POA: Diagnosis not present

## 2023-08-24 DIAGNOSIS — Z9981 Dependence on supplemental oxygen: Secondary | ICD-10-CM | POA: Diagnosis not present

## 2023-08-25 DIAGNOSIS — M6281 Muscle weakness (generalized): Secondary | ICD-10-CM | POA: Diagnosis not present

## 2023-08-25 DIAGNOSIS — T827XXA Infection and inflammatory reaction due to other cardiac and vascular devices, implants and grafts, initial encounter: Secondary | ICD-10-CM | POA: Diagnosis not present

## 2023-08-25 DIAGNOSIS — R7881 Bacteremia: Secondary | ICD-10-CM | POA: Diagnosis not present

## 2023-08-25 DIAGNOSIS — Z792 Long term (current) use of antibiotics: Secondary | ICD-10-CM | POA: Diagnosis not present

## 2023-08-25 DIAGNOSIS — R2689 Other abnormalities of gait and mobility: Secondary | ICD-10-CM | POA: Diagnosis not present

## 2023-08-26 DIAGNOSIS — M6281 Muscle weakness (generalized): Secondary | ICD-10-CM | POA: Diagnosis not present

## 2023-08-26 DIAGNOSIS — T827XXA Infection and inflammatory reaction due to other cardiac and vascular devices, implants and grafts, initial encounter: Secondary | ICD-10-CM | POA: Diagnosis not present

## 2023-08-26 DIAGNOSIS — Z792 Long term (current) use of antibiotics: Secondary | ICD-10-CM | POA: Diagnosis not present

## 2023-08-26 DIAGNOSIS — R2689 Other abnormalities of gait and mobility: Secondary | ICD-10-CM | POA: Diagnosis not present

## 2023-08-26 DIAGNOSIS — R7881 Bacteremia: Secondary | ICD-10-CM | POA: Diagnosis not present

## 2023-08-26 NOTE — Telephone Encounter (Signed)
 [facility] pharmacy called re: injection RX, and wants to know if one of their [employer] Nurses needs to be scheduled to administer at home injections - advised [name] and took a message for call  back

## 2023-08-27 ENCOUNTER — Ambulatory Visit (INDEPENDENT_AMBULATORY_CARE_PROVIDER_SITE_OTHER): Admitting: Allergy and Immunology

## 2023-08-27 ENCOUNTER — Other Ambulatory Visit: Payer: Self-pay

## 2023-08-27 ENCOUNTER — Encounter: Payer: Self-pay | Admitting: Allergy and Immunology

## 2023-08-27 VITALS — BP 108/46 | HR 80 | Temp 98.3°F

## 2023-08-27 DIAGNOSIS — I82401 Acute embolism and thrombosis of unspecified deep veins of right lower extremity: Secondary | ICD-10-CM | POA: Diagnosis not present

## 2023-08-27 DIAGNOSIS — J4550 Severe persistent asthma, uncomplicated: Secondary | ICD-10-CM | POA: Diagnosis not present

## 2023-08-27 DIAGNOSIS — D801 Nonfamilial hypogammaglobulinemia: Secondary | ICD-10-CM | POA: Diagnosis not present

## 2023-08-27 DIAGNOSIS — E0590 Thyrotoxicosis, unspecified without thyrotoxic crisis or storm: Secondary | ICD-10-CM | POA: Diagnosis not present

## 2023-08-27 DIAGNOSIS — E440 Moderate protein-calorie malnutrition: Secondary | ICD-10-CM | POA: Diagnosis not present

## 2023-08-27 DIAGNOSIS — I5032 Chronic diastolic (congestive) heart failure: Secondary | ICD-10-CM | POA: Diagnosis not present

## 2023-08-27 NOTE — Patient Instructions (Signed)
  1. Complete immunoglobulin infusion in this clinic Thursday  2. Will switch to IVIG in the near future - Tammy  3. Check IgA/G/M today  4. Return to clinic in 12 weeks or earlier if problem  5. Influenza = Tamiflu . Covid = Paxlovid

## 2023-08-27 NOTE — Progress Notes (Unsigned)
 [facility] [place] [facility] [place] [facility] [place] -    Follow-up Note  Referring Provider: [name], MD Primary Provider: [name], MD Date of Office Visit: 08/27/2023  Subjective:   Ebony Scott (DOB: 07-Nov-1950) is a 73 y.o. 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  I last saw her in this clinic 16 April 2023.  Since I have seen her in this clinic she has contracted a RLL community-acquired pneumonia requiring hospitalization 18 May 2023.  She required hospitalization at the tail end of June 2025 for sepsis secondary to Enterococcus.  She is currently receiving PICC line delivered antibiotics in a rehab center.Ebony Scott  She was consistently using her subcutaneous immunoglobulin infusions but for the past 3 weeks she has missed her immunoglobulin fusions because she is in a rehab center.  Allergies as of 08/27/2023       Reactions   Baclofen  Anaphylaxis, Other (See Comments)   Altered mental status requiring a 3-day hospital stay- patient became unresponsive Put me in a coma for five days, Altered mental status requiring a 3-day hospital stay- patient became unresponsive   Other Shortness Of Breath, Other (See Comments)   Grass and weeds sneezing; filled sinuses (02/20/2012)   Pork-derived Products Anaphylaxis   Shellfish Allergy  Anaphylaxis, Shortness Of Breath, Itching, Rash   Shrimp Extract Anaphylaxis, Other (See Comments)   ALL SHELLFISH   Tetracycline Hcl Nausea And Vomiting   Xolair  [omalizumab ] Other (See Comments)   Caused Blood clot   Zoledronic  Acid Other (See Comments)   Reclast -  Fever, Put in hospital, dr said it was a reaction from a reaction  Fever, inability to walk, Put in hospital Fever, Put in hospital, dr said it was a reaction from a reaction , Other reaction(s): Unknown   Celecoxib Swelling   Feet swelling   Dilaudid  [hydromorphone  Hcl] Itching   Hydrocodone -acetaminophen  Itching   Levofloxacin  Other (See Comments)   GI upset   Molds & Smuts Cough   Morphine  And Codeine Hives, Itching   Oxycodone  Hcl Nausea And Vomiting   GI Intolerance   Paroxetine Nausea And Vomiting, Other (See Comments)   GI Intolerance also   Tetracycline Hcl Other (See Comments)   GI Intolerance   Oxycodone -acetaminophen  Itching   Diltiazem  Hcl Swelling   Dust Mite Extract Other (See Comments), Cough   sneezing (02/20/2012) too   Gamunex [immune Globulin ] Itching, Rash   Tolerates hizentra     Rituximab  Other (See Comments)   Anemia    Rofecoxib Swelling   Vioxx- feet swelling   Tree Extract Itching   tested and told I was allergic to it; never experienced a reaction to it (02/20/2012)        Medication List    acetaminophen  500 MG tablet Commonly known as: TYLENOL  Take 1 tablet (500 mg total) by mouth every 6 (six) hours as needed for moderate pain (pain score 4-6) or mild pain (pain score 1-3).   albuterol  108 (90 Base) MCG/ACT inhaler Commonly known as: VENTOLIN  HFA Inhale 2 puffs into the lungs every 6 (six) hours as needed for wheezing or shortness of breath.   albuterol  (2.5 MG/3ML) 0.083% nebulizer solution Commonly known as: PROVENTIL  Take 3 mLs (2.5  mg total) by nebulization every 4 (four) hours as needed for wheezing or shortness of breath.   Align 4 MG Caps Take 4 mg by mouth every evening.   allopurinol  100 MG tablet Commonly known as: ZYLOPRIM  Take 1 tablet (100 mg total) by mouth daily.   Alpha-Lipoic Acid 600 MG Tabs Take 600 mg by mouth in the morning.   ampicillin  IVPB Inject 2 g into the vein every 8 (eight) hours for 28 days.  Indication:  E faecalis bacteremia/ICD infection First Dose: Yes Last Day of Therapy:  09/19/23 Labs - Once weekly:  CBC/D and BMP, Labs - Once weekly: ESR and CRP   atorvastatin  80 MG tablet Commonly known as: LIPITOR Take 1 tablet (80 mg total) by mouth every evening.   Azelastine  HCl 137 MCG/SPRAY Soln 2 sprays once per day   BEANO PO Take 2 tablets by mouth in the morning, at noon, and at bedtime.   cefTRIAXone  IVPB Commonly known as: ROCEPHIN  Inject 2 g into the vein every 12 (twelve) hours for 28 days. Indication:  E faecalis bacteremia/ICD infection First Dose: Yes Last Day of Therapy:  09/19/23 Labs - Once weekly:  CBC/D and BMP, Labs - Once weekly: ESR and CRP   cetirizine  10 MG tablet Commonly known as: ZYRTEC  Take 1 tablet (10 mg total) by mouth daily as needed for allergies (Can take a n extra dose during flare ups.). What changed: when to take this   CITRACAL CALCIUM +D 600-40-500 MG-MG-UNIT Tb24 Generic drug: Calcium -Magnesium -Vitamin D  Take 1 tablet by mouth daily.   denosumab  60 MG/ML Sosy injection Commonly known as: PROLIA  Inject 60 mg into the skin every 6 (six) months.   dexlansoprazole  60 MG capsule Commonly known as: Dexilant  Take 1 capsule (60 mg total) by mouth in the morning.   empagliflozin  10 MG Tabs tablet Commonly known as: Jardiance  Take 1 tablet (10 mg total) by mouth daily.   Entresto  24-26 MG Generic drug: sacubitril -valsartan  TAKE 1 TABLET BY MOUTH TWICE A DAY   EPINEPHrine  0.3 mg/0.3 mL Soaj injection Commonly known as: EPI-PEN USE AS DIRECTED BY YOUR PHYSICIAN INTRAMUSCULARLY AS NEEDED FOR ANAPHYLAXIS   famotidine  20 MG tablet Commonly known as: PEPCID  TAKE 1 TABLET (20 MG) BY MOUTH DAILY BEFORE LUNCH AND1 TABLET BY MOUTH EACH NIGHT AT BEDTIME What changed: See the new instructions.   fluticasone  50 MCG/ACT nasal spray Commonly known as: FLONASE  Place 2 sprays into both nostrils daily. What changed:  how much to take when  to take this reasons to take this   folic acid  1 MG tablet Commonly known as: FOLVITE  Take 1 tablet (1 mg total) by mouth daily.   furosemide  40 MG tablet Commonly known as: LASIX  Take 0.5 tablets (20 mg total) by mouth daily as needed for fluid or edema (Weight gain, edema >3lbs). Monday and Thursday   gabapentin  300 MG capsule Commonly known as: NEURONTIN  Take 1 capsule (300 mg total) by mouth at bedtime.   guaiFENesin  600 MG 12 hr tablet Commonly known as: MUCINEX  Take 600 mg by mouth 2 (two) times daily.   Hizentra  10 GM/50ML Soln Generic drug: Immune Globulin  (Human) Inject 15 g into the skin once a week. 13 g Infusion   ipratropium-albuterol  0.5-2.5 (3) MG/3ML Soln Commonly known as: DUONEB Take 3 mLs by nebulization every 6 (six) hours as needed.   isosorbide -hydrALAZINE  20-37.5 MG tablet Commonly known as: BIDIL  TAKE 1 TABLET BY MOUTH TWICE A DAY   LACTOSE FAST ACTING RELIEF PO Take 2  tablets by mouth 3 (three) times daily before meals.   montelukast  10 MG tablet Commonly known as: SINGULAIR  Take 1 tablet (10 mg total) by mouth daily after supper.   mupirocin  ointment 2 % Commonly known as: BACTROBAN  Apply 1 Application topically as needed (wounds).   Nebulizer Mask Adult Misc 1 Device by Does not apply route as directed.   nystatin  100000 UNIT/ML suspension Commonly known as: MYCOSTATIN  Swish, gargle, and swallow 5 ml after Trelegy use   predniSONE  5 MG tablet Commonly known as: DELTASONE  Take 1 tablet (5 mg total) by mouth daily with breakfast.   PRENATAL VITAMINS PO Take 1 tablet by mouth in the morning.   rivaroxaban  20 MG Tabs tablet Commonly known as: Xarelto  Take 1 tablet (20 mg total) by mouth daily with supper.   sodium bicarbonate  650 MG tablet Take 650 mg by mouth daily.   sodium chloride  HYPERTONIC 3 % nebulizer solution Take 4 mLs by nebulization at bedtime.   Trelegy Ellipta  200-62.5-25 MCG/ACT Aepb Generic drug:  Fluticasone -Umeclidin-Vilant INHALE 1 INHALATION INTO THE LUNGS IN THE MORNING. What changed: when to take this   Vitamin D3 25 MCG (1000 UT) Caps Take 1,000 Units by mouth at bedtime.   Voltaren  1 % Gel Generic drug: diclofenac  Sodium Apply 1 Application topically as needed (pain).        Past Medical History:  Diagnosis Date   Anemia    Angio-edema    Breast cancer (HCC) 1998   Left breast, in remission   Bronchiectasis (HCC)    CHF (congestive heart failure) (HCC)    Closed displaced fracture of metatarsal bone of right foot 01/20/2019   Closed fracture of fifth metatarsal bone of right foot, initial encounter 01/15/2018   Clostridium difficile colitis 01/14/2018   Coronary artery disease    Depression    some; don't take anything for it (02/20/2012), pt denies dx as of 06/02/21   Diverticulosis    DVT (deep venous thrombosis) (HCC)    Fibromyalgia 11/2011   Fracture of base of fifth metatarsal bone with routine healing, left    GAVE (gastric antral vascular ectasia)    GERD (gastroesophageal reflux disease)    Headache(784.0)    related to allergies; more at different times during the year (02/20/2012)   Hemorrhoids    Hiatal hernia    back and neck   History of blood transfusion 2023   1 unit, 5 units of Iron    Hx of adenomatous colonic polyps 04/12/2016   Hypercholesteremia    good cholesterol is high   Hypothyroidism    h/o Graves disease   IBS (irritable bowel syndrome)    Moderate persistent asthma    -FeV1 72% 2011, -IgE 102 2011, CT sinus Neg 2011   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 Cindy Setter   Sleep apnea    uses cpap nightly   Tracheobronchomalacia    Vasculitis (HCC) 12/13/2017    Past Surgical History:  Procedure Laterality  Date   ABDOMINAL HYSTERECTOMY N/A    Phreesia 10/21/2019   ANTERIOR AND POSTERIOR REPAIR  1990's   APPENDECTOMY     ARGON LASER APPLICATION  02/05/2021   Procedure: ARGON LASER APPLICATION;  Surgeon: Leigh Elspeth SQUIBB, MD;  Location: [facility];  Service:  Gastroenterology;;   BREAST LUMPECTOMY  1998   left   BREAST SURGERY N/A    Phreesia 10/21/2019   BRONCHIAL WASHINGS  04/05/2020   Procedure: BRONCHIAL WASHINGS;  Surgeon: Kara Dorn NOVAK, MD;  Location: [facility];  Service: Pulmonary;;   CAPSULOTOMY Right 08/28/2021   Procedure: CAPSULOTOMY;  Surgeon: Tobie Franky SQUIBB, DPM;  Location: [facility];  Service: Podiatry;  Laterality: Right;   CARPOMETACARPEL (CMC) FUSION OF THUMB WITH AUTOGRAFT FROM RADIUS  ~ 2009   both thumbs (02/20/2012)   CATARACT EXTRACTION W/ INTRAOCULAR LENS  IMPLANT, BILATERAL  2012   CERVICAL DISCECTOMY  10/2001   C5-C6   CERVICAL FUSION  2003   C3-C4   CHOLECYSTECTOMY     COLONOSCOPY     CORONARY STENT INTERVENTION N/A 01/11/2021   Procedure: CORONARY STENT INTERVENTION;  Surgeon: Anner Alm ORN, MD;  [facility] INVASIVE CV LAB;  Service: Cardiovascular;  Laterality: N/A;   DEBRIDEMENT TENNIS ELBOW  ?1970's   right   ESOPHAGOGASTRODUODENOSCOPY     ESOPHAGOGASTRODUODENOSCOPY (EGD) WITH PROPOFOL  N/A 02/05/2021   Procedure: ESOPHAGOGASTRODUODENOSCOPY (EGD) WITH PROPOFOL ;  Surgeon: Leigh Elspeth SQUIBB, MD;  Location: [facility];  Service: Gastroenterology;  Laterality: N/A;   EYE SURGERY N/A    Phreesia 10/21/2019   HAMMER TOE SURGERY Right 08/28/2021   Procedure: HAMMER TOE REPAIR 2ND TOE RIGHT FOOT;  Surgeon: Tobie Franky SQUIBB, DPM;  Location: [facility];  Service: Podiatry;  Laterality: Right;   HYSTERECTOMY     I & D EXTREMITY Right 09/22/2020   Procedure: IRRIGATION AND DEBRIDEMENT RIGHT HAND AND ELBOW;  Surgeon: Josefina Chew, MD;  Location: [facility];  Service: Orthopedics;  Laterality: Right;   ICD GENERATOR REMOVAL N/A 08/21/2023   Procedure: ICD  GENERATOR REMOVAL;  Surgeon: Waddell Danelle ORN, MD;  Location: [facility] INVASIVE CV LAB;  Service: Cardiovascular;  Laterality: N/A;   ICD IMPLANT N/A 08/27/2022   Procedure: ICD IMPLANT;  Surgeon: Nancey Eulas BRAVO, MD;  Location: [facility];  Service: Cardiovascular;  Laterality: N/A;   KNEE ARTHROPLASTY  ?8009'd   ?right; w/cartilage repair (02/20/2012)   LEAD EXTRACTION N/A 08/21/2023   Procedure: LEAD EXTRACTION;  Surgeon: Waddell Danelle ORN, MD;  Location: [facility] INVASIVE CV LAB;  Service: Cardiovascular;  Laterality: N/A;   MASS EXCISION Left 08/28/2021   Procedure: EXCISION OF SOFT TISSUE  MASS LEFT FOOT;  Surgeon: Tobie Franky SQUIBB, DPM;  Location: [facility];  Service: Podiatry;  Laterality: Left;   NASAL SEPTUM SURGERY  1980's   POSTERIOR CERVICAL FUSION/FORAMINOTOMY  2004   failed initial fusion; rewired  anterior neck (02/20/2012)   REVERSE SHOULDER ARTHROPLASTY Right 02/16/2020   Procedure: REVERSE SHOULDER ARTHROPLASTY;  Surgeon: Josefina Chew, MD;  Location: [facility];  Service: Orthopedics;  Laterality: Right;   RIGHT/LEFT HEART CATH AND CORONARY ANGIOGRAPHY N/A 01/11/2021   Procedure: RIGHT/LEFT HEART CATH AND CORONARY ANGIOGRAPHY;  Surgeon: Anner Alm ORN, MD;  Location: [facility] INVASIVE CV LAB;  Service: Cardiovascular;  Laterality: N/A;   SPINE SURGERY N/A    Phreesia 10/21/2019   TONSILLECTOMY  ~ 1953   TRANSESOPHAGEAL ECHOCARDIOGRAM (CATH LAB) N/A 08/12/2023   Procedure: TRANSESOPHAGEAL ECHOCARDIOGRAM;  Surgeon: Pietro Redell RAMAN, MD;  Location: [facility] INVASIVE CV LAB;  Service: Cardiovascular;  Laterality: N/A;   VESICOVAGINAL FISTULA CLOSURE W/ TAH  1988   VIDEO BRONCHOSCOPY Bilateral 08/23/2016   Procedure: VIDEO BRONCHOSCOPY WITH FLUORO;  Surgeon: Noreen Tonnie BRAVO, MD;  Location: [facility];  Service: Cardiopulmonary;  Laterality: Bilateral;   VIDEO BRONCHOSCOPY N/A  04/05/2020   Procedure: VIDEO BRONCHOSCOPY WITHOUT FLUORO;  Surgeon: Kara Dorn NOVAK, MD;  Location: [facility];  Service:  Pulmonary;  Laterality: N/A;    Review of systems negative except as noted in HPI / PMHx or noted below:  ROS   Objective:   Vitals:   08/27/23 1350  BP: (!) 108/46  Pulse: 80  Temp: 98.3 F (36.8 C)  SpO2: 95%          Physical Exam  Diagnostics:    Spirometry was performed and demonstrated an FEV1 of *** at *** % of predicted.  The patient had an Asthma Control Test with the following results:  .    Results of a chest x-ray obtained 21 August 2023 identified the following:  1. Streaky and patchy bibasilar opacities compatible with atelectasis. 2. Irregular density in the right mid lung corresponding to an area of architectural distortion and parenchymal scarring described in the right upper lobe on recent chest CT.  Results of a CT scan chest obtained 31 July 2023 identified the following:  Cardiovascular: Extensive multi-vessel coronary artery calcification long segment left anterior descending coronary artery stenting. Left subclavian single lead pacemaker in place with lead within the right ventricle toward the apex. Global cardiac size within normal limits. No pericardial effusion. Central pulmonary arteries are of normal caliber. Moderate atherosclerotic calcification within the thoracic aorta. No aortic aneurysm.   Mediastinum/Nodes: No enlarged mediastinal or axillary lymph nodes. Thyroid  gland, trachea, and esophagus demonstrate no significant findings.   Lungs/Pleura: Scattered areas of bronchiectasis, architectural distortion, and parenchymal scarring are seen within the right upper lobe, and right lower lobe the areas of previously noted consolidation in keeping with mild post inflammatory scarring. There is persistent mild peribronchial nodularity and ground-glass infiltrate within the right lower lobe in the area prior consolidation in keeping with a small amount of residual inflammatory change however, this is significantly improved since prior  examination in keeping with a resolving infectious or inflammatory process. New areas of tree in bud peribronchial nodularity and peripheral consolidation are noted within the medial left upper lobe (41/5) in keeping with an acute infectious or inflammatory process. No pneumothorax or pleural effusion.  Assessment and Plan:   1. Hypogammaglobulinemia (HCC)   2. Not well controlled severe persistent asthma    1. Complete immunoglobulin infusion in this clinic Thursday  2. Will switch to IVIG in the near future - Tammy  3. Check IgA/G/M today  4. Return to clinic in 12 weeks or earlier if problem  5. Influenza = Tamiflu . Covid = Paxlovid  Saidee has had 2 significant infections since have last seen her in this clinic while using subcutaneous immunoglobulin and we will check her IgG level today with the understanding that she has missed 3 infusions because she is in a rehab center.  We will arrange to have her get an infusion this week in our clinic.  I think she would be best served by switching her subcutaneous immunoglobulin to a hyperimmune intravenous immunoglobulin formulation and we will get that arranged some point in the near future.  Camellia Denis, MD Allergy  / Immunology [facility]

## 2023-08-27 NOTE — Telephone Encounter (Signed)
 Spoke with [facility] and informed them that one of their Maven Nurse would need to administer patients infusion as we do not do infusions in our office. We are only providing the patient with an exam room for the administration. The Maven Nurse would also need to bring any bio hazardous waste bins to discharged any waste used as we do not have any in office.   Left a message for daughter Charlies to contact me in regards to scheduling. Patient does have an office visit with dr. Kozlow today at 1:45 pm, if I do not hear back from [facility] before then, then I will speak with her at the patients visit.

## 2023-08-28 DIAGNOSIS — E119 Type 2 diabetes mellitus without complications: Secondary | ICD-10-CM | POA: Diagnosis not present

## 2023-08-28 DIAGNOSIS — I509 Heart failure, unspecified: Secondary | ICD-10-CM | POA: Diagnosis not present

## 2023-08-28 DIAGNOSIS — J189 Pneumonia, unspecified organism: Secondary | ICD-10-CM | POA: Diagnosis not present

## 2023-08-28 DIAGNOSIS — A419 Sepsis, unspecified organism: Secondary | ICD-10-CM | POA: Diagnosis not present

## 2023-08-28 DIAGNOSIS — T827XXA Infection and inflammatory reaction due to other cardiac and vascular devices, implants and grafts, initial encounter: Secondary | ICD-10-CM | POA: Diagnosis not present

## 2023-08-28 DIAGNOSIS — J9620 Acute and chronic respiratory failure, unspecified whether with hypoxia or hypercapnia: Secondary | ICD-10-CM | POA: Diagnosis not present

## 2023-08-28 DIAGNOSIS — R7881 Bacteremia: Secondary | ICD-10-CM | POA: Diagnosis not present

## 2023-08-28 DIAGNOSIS — R262 Difficulty in walking, not elsewhere classified: Secondary | ICD-10-CM | POA: Diagnosis not present

## 2023-08-28 LAB — IGG, IGA, IGM
IgA/Immunoglobulin A, Serum: 231 mg/dL (ref 64–422)
IgG (Immunoglobin G), Serum: 982 mg/dL (ref 586–1602)
IgM (Immunoglobulin M), Srm: 85 mg/dL (ref 26–217)

## 2023-08-29 ENCOUNTER — Ambulatory Visit

## 2023-08-29 ENCOUNTER — Telehealth: Payer: Self-pay | Admitting: Family Medicine

## 2023-08-29 DIAGNOSIS — G894 Chronic pain syndrome: Secondary | ICD-10-CM | POA: Diagnosis not present

## 2023-08-29 DIAGNOSIS — D649 Anemia, unspecified: Secondary | ICD-10-CM | POA: Diagnosis not present

## 2023-08-29 DIAGNOSIS — R7881 Bacteremia: Secondary | ICD-10-CM | POA: Diagnosis not present

## 2023-08-29 DIAGNOSIS — Z7952 Long term (current) use of systemic steroids: Secondary | ICD-10-CM

## 2023-08-29 DIAGNOSIS — Z888 Allergy status to other drugs, medicaments and biological substances status: Secondary | ICD-10-CM

## 2023-08-29 DIAGNOSIS — T380X5A Adverse effect of glucocorticoids and synthetic analogues, initial encounter: Secondary | ICD-10-CM

## 2023-08-29 DIAGNOSIS — J45909 Unspecified asthma, uncomplicated: Secondary | ICD-10-CM | POA: Diagnosis not present

## 2023-08-29 DIAGNOSIS — D84821 Immunodeficiency due to drugs: Secondary | ICD-10-CM

## 2023-08-29 DIAGNOSIS — M6281 Muscle weakness (generalized): Secondary | ICD-10-CM | POA: Diagnosis not present

## 2023-08-29 DIAGNOSIS — N179 Acute kidney failure, unspecified: Secondary | ICD-10-CM | POA: Diagnosis not present

## 2023-08-29 DIAGNOSIS — Z7409 Other reduced mobility: Secondary | ICD-10-CM | POA: Diagnosis not present

## 2023-08-29 NOTE — Progress Notes (Signed)
 Immunotherapy   Patient Details  Name: Ebony Scott MRN: [id number] Date of Birth: 20-Jan-1951  08/29/2023  Ebony Scott [name] Bathe here to use a room for her home health nurse to administer the patients Hizentra  infusion. Patient was in clinic for 2 hours with no problems. Patient was picked up and transported back to the rehab facility.   [name] [name] Irving 08/29/2023, 11:47 AM

## 2023-08-29 NOTE — Telephone Encounter (Signed)
Phone keeps ringing

## 2023-08-29 NOTE — Telephone Encounter (Signed)
 Copied from CRM [id number]. Topic: General - Other >> Aug 29, 2023  1:17 PM Wess RAMAN wrote: Reason for CRM: [place] from [employer] would like information about patient's Covid and Prevnar vaccine.  Callback #: [phone]

## 2023-08-30 NOTE — Progress Notes (Signed)
 Office Visit Note  Patient: Ebony Scott             Date of Birth: [birth year]           MRN: [id number]             PCP: [name], MD Referring: [name], MD Visit Date: 09/13/2023   Subjective:  Follow-up (Patient states she is currently being treated for a blood infection through a nursing home until at least 10/02/2023. )   Discussed the use of AI scribe software for clinical note transcription with the patient, who gave verbal consent to proceed.  History of Present Illness   Ebony Scott is a 73 y.o. female here for follow up with seropositive rheumatoid arthritis and osteoarthritis on prednisone  5 mg daily.  She experiences persistent pain in her feet and fingers, with constant pain in her feet and significant pain in one finger. She is unable to assess swelling in her feet due to wearing compression socks. She requires surgery on the left shoulder, which has not yet been performed.  She is currently taking 5 mg of prednisone  daily. She has not been using topical medications like Voltaren  recently, as they are not available at her current facility, although she previously used them at home.  She recently underwent a procedure involving the removal or adjustment of a defibrillator and some leads. She is uncertain if further procedures are needed and has not had a follow-up since the procedure earlier this month.  She is on intravenous antibiotics and has a PICC line in place, which imposes restrictions and causes difficulty in performing certain activities.  She needs toenail care, which she typically receives through a manicure and pedicure, but is currently unable to access.       Previous HPI 05/13/2023 Ebony Scott is a 73 year old female with seropositive rheumatoid arthritis and osteoarthritis on prednisone  7 mg daily who presents with joint pains, recent fall, and recent respiratory infections.   She experiences persistent joint pain, particularly in  her fingers, with one finger that 'hurts all the time.' An injection in the finger last year did not provide significant relief. Shoulder pain, previously treated with an injection, was initially helpful but has since returned.   Regarding her rheumatoid arthritis, there have been no major flare-ups or joint swelling recently, with symptoms described as 'just the usual.' She is currently taking 50 mg of tramadol  daily, which she finds helpful, and also takes Tylenol  650 mg, two tablets in the morning and two in the evening.   She has been experiencing recurrent pneumonia, with the most recent episode occurring within the past couple of months. She is currently on a prednisone  dose of seven milligrams, which has raised concerns about her susceptibility to infections.   She experienced a fall recently when she slipped in the mud, but reports no other recent falls. No shooting pain down her arm or finger triggering. She also denies pain in her thumb or other fingers, except for the one that is sore.    Recent labs reviewed 05/02/23 CBC okay CMP eGFR 43 Rest okay       Previous HPI 12/11/2022 Ebony Scott is a 73 y.o. female here for follow up for seropositive RA on prednisone  7 mg daily.  Unfortunate since the last visit she had pneumonia twice with exacerbation of her chronic bronchitis and is currently at facility for rehab after hospitalization.  Steroid injection to the finger  at her last visit did not help much with regards to pain swelling or joint mobility.  She still noticing some redness and sometimes warmth to the joints near the tips of her fingers.  Worst problem currently is left shoulder pain.  She has difficulty reaching overhead also has pain worse at nighttime sometimes disrupting sleep.  Does not have symptoms radiating down the arm.   Previous HPI 09/10/2022 Ebony Scott is a 73 y.o. female here for follow up for seropositive RA on prednisone  7 mg daily.  She had recent  hospitalization for fevers and hypotension without clear infectious source identified but concern of HCAP as this followed shortly after ICD placement on 7/8. She was treated with rocephin  and azithromycin  and transitioned to cefadroxil  and azithromycin  at discharge. She completed antibiotics but still has some diarrhea with frequent, watery stools though without any blood or mucus contents. She thinks the left shoulder steroid injection in April helped, but is currently limited unable to move her shoulder much after the recent ICD placement. Worst joint at present is right 5th PIP joint is very painful with movement and sometimes at rest.   Previous HPI 05/22/2022 Ebony Scott is a 73 y.o. female here for follow up for seropositive RA on prednisone  7 mg daily and we started hydroxychloroquine  200 mg daily.  Unfortunately did not see any significant benefit to joint inflammation with additional medication added.  She did get sick with community-acquired pneumonia resulting in hospitalization on March 6.  Respiratory status improved with treatment but unfortunately had a prolonged course 10 days until discharge.  Then had to go to short-term at rehab facility before returning home last weekend.  She was visited by home health physical therapy once on Easter Sunday but has not had any additional follow-up or treatment.  She feels overall weak and mobility is much worse compared to prior to hospitalization.  Also developed new erythematous skin rash involving abdomen and neck.   Previous HPI 03/07/22 Ebony Scott is a 73 y.o. female here for follow up for seropositive RA on prednisone  7 mg daily. She has increased joint pain in multiple areas especially bilateral hands also left shoulder and right knee. She has seen only a small benefit in symptoms so far after the knee viscosupplementation shots last year. She has pain pretty much all the time both morning and night time. She notices some worsening of lateral  deviation in the small finger on her right hand.    Previous HPI 11/29/21 Ebony Scott is a 73 y.o. female here for follow up for seronegative RA and cushing's syndrome currently on long term prednisone . She is on hizentra  for hypogammaglobulinemia. Since last visit she had a bone marrow biopsy for macrocytosis with benign findings. She is having an exacerbation of asthma with wheezing and coughing increased since early September and did not improve well with a prolonged steroid taper. Subsequently has not been prescribed azithromycin  and higher dose steroid starting about 5 days ago so far still having a lot of coughing and symptoms. She notices increased joint pain in hands and knees during this episode. She completed a series of 3 viscosupplementation injections in the left knee about 2 weeks ago with a partial benefit.   Previous HPI 08/09/2021 Ebony Scott is a 73 y.o. female here for follow up or seronegative RA and cushing's syndrome on prednisone  with gradual tapering but increased back to 7 mg daily after last visit.  She is having some increased  joint pain at multiple areas but worst problem is increased pain in her left knee.  This is severe enough to limit her tolerance for weightbearing and mobility.  She is continuing treatments for hypogammaglobulinemia with the Hizentra  subcu weekly.  She has not had any severe interval infections.  She is scheduled for hammertoe corrective surgery next month.   Previous HPI 04/28/21 Ebony Scott is a 73 y.o. female here for follow up for seronegative RA and cushing's syndrome on prednisone  with gradual tapering from 8 mg in December. She developed worsening iron  deficiency anemia after GI bleed getting venofer  replacement. Joint and skin problems were remaining pretty stable until about 2 weeks ot a month ago. This was after decreasing prednisone  to 6 mg daily dose. Knee pain limits her mobility but hands have the most increase in swelling. Skin rash on  the back of her neck and throughout her low back. She tried topical hydrocortisone  without a large improvement so far.   Previous HPI 01/23/21 Ebony Scott is a 73 y.o. female here for follow up for seronegative RA minimally responsive to multiple DMARD treatments on prednisone  10 mg PO daily with acquired cushing's syndrome. She has noticed some increase in right hand MCP joint swelling but overall joints not dramatically changed with dose reduction. Since our visit she went for coronary artery stent placement after study for evaluating cardiomyopathy found significant stenosis. No anginal symptoms, she is not sure if any different but husband reports noticing some improvement in her dyspnea on exertion. She has had numerous other issues increased upper airway congestion and drainage. Sometimes productive coughing with this. She has had mild nasal bleeding with rhinitis and and sneezing issues.   Previous HPI 11/28/20 Ebony Scott is a 73 y.o. female here for iatrogenic cushing syndrome and history of rheumatoid arthritis with long term prednisone  treatment, previously seen by Dr. Curt at [facility] for this problem. Mostly had been taking treatments after hospitalization with severe joint inflammation in 2020. She has tried taking numerous DMARD treatments for years including actemra , simponi , abatacept, and rituximab  so far without great response or tolerance. Chronic prednisone  required for symptoms throughout this time but has taken these longstanding for asthma disease already. She has had some issue with recurrent infections with pulmonary and septic bursitis and more recently noninfectious allergy  symptoms managed with Dr. Kozlow and Kara. She started on IVIG treatments with Dr. Kozlow for this identified recently, not sure if primary or related to previous medication   DMARD Hx Leflunomide  Hydroxychloroquine  Rituximab  Abatacept Simponi  Actemra  IV   Review of Systems  Constitutional:   Positive for fatigue.  HENT:  Negative for mouth sores and mouth dryness.   Eyes:  Negative for dryness.  Respiratory:  Positive for shortness of breath.   Cardiovascular:  Negative for chest pain and palpitations.  Gastrointestinal:  Negative for blood in stool, constipation and diarrhea.  Endocrine: Negative for increased urination.  Genitourinary:  Negative for involuntary urination.  Musculoskeletal:  Positive for gait problem, myalgias, morning stiffness, muscle tenderness and myalgias. Negative for joint pain, joint pain, joint swelling and muscle weakness.  Skin:  Negative for color change, rash, hair loss and sensitivity to sunlight.  Allergic/Immunologic: Positive for susceptible to infections.  Neurological:  Negative for dizziness and headaches.  Hematological:  Negative for swollen glands.  Psychiatric/Behavioral:  Negative for depressed mood and sleep disturbance. The patient is not nervous/anxious.     PMFS History:  Patient Active Problem List   Diagnosis Date  Noted   Pacemaker infection (HCC) 09/04/2023   Medication management 09/03/2023   PICC (peripherally inserted central catheter) in place 09/03/2023   Enterococcus faecalis infection 08/08/2023   Enterococcal bacteremia 08/08/2023   Community acquired pneumonia 08/06/2023   Generalized weakness 08/06/2023   AKI (acute kidney injury) (HCC) 08/06/2023   Acute metabolic acidosis 05/20/2023   RLL pneumonia 05/18/2023   Nocturnal hypoxia - 2-3 L/min 05/18/2023   Acute respiratory failure with hypoxia (HCC) 05/18/2023   Current chronic use of systemic steroids - prednisone  7 mg daily for hx of RA 05/18/2023   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   Sepsis without acute organ dysfunction (HCC) 11/30/2022   Fever 09/01/2022   DNR (do not resuscitate) 08/31/2022   Noninfectious otitis externa of left ear 06/08/2021   Iron  deficiency anemia 02/24/2021   GAVE (gastric antral vascular ectasia)    Anticoagulated     Antiplatelet or antithrombotic long-term use    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   Coronary artery calcification seen on CAT scan 01/11/2021   History of DVT (deep vein thrombosis) 09/24/2020   DMII (diabetes mellitus, type 2) (HCC) 09/23/2020   Hoarseness 09/09/2020   S/P reverse total shoulder arthroplasty, right    DOE (dyspnea on exertion) 02/18/2020   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   S/P reverse total shoulder arthroplasty, left 02/16/2020   History of pulmonary embolus (PE) 12/19/2018   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   Rheumatoid arthritis (HCC)    Bilateral impacted cerumen 08/18/2018   Hypothyroidism 01/14/2018   Depression 01/14/2018   Debility    Protein-calorie malnutrition, moderate (HCC) 12/17/2017   Knee pain    History of breast cancer    Chronic pain syndrome    Fibromyalgia    Graves disease    Dysphonia 08/10/2017   Pulmonary nodules    Chronic kidney disease, stage 3b (HCC) - baseline Scr 1.3-1.5 04/15/2016   Hx of adenomatous colonic polyps 04/12/2016   Opacity of lung on imaging study 11/03/2015   Severe persistent asthma 02/08/2015   Facial pain syndrome 02/08/2015   Insomnia 02/08/2015   Other allergic rhinitis 02/08/2015   LPRD (laryngopharyngeal reflux disease) 02/08/2015   Chronic systolic heart failure (HCC) 02/08/2015   Diastolic dysfunction 02/08/2015   Benign paroxysmal positional vertigo 12/03/2012   Disequilibrium 10/29/2012   Pseudogout 02/24/2012   Irritable bowel syndrome 01/02/2010   Hyperlipidemia 12/23/2009   Hyperthyroidism 11/12/2006   Seasonal and perennial allergic rhinitis 11/12/2006   GERD 11/12/2006   NECK PAIN, CHRONIC 11/12/2006   OSTEOPOROSIS 11/12/2006   BREAST CANCER, HX OF 11/12/2006    Past Medical History:  Diagnosis Date   Allergy     Anemia    Angio-edema    Breast cancer (HCC) 1998   Left breast, in remission   Bronchiectasis (HCC)    Cataract    CHF (congestive heart  failure) (HCC)    Closed displaced fracture of metatarsal bone of right foot 01/20/2019   Closed fracture of fifth metatarsal bone of right foot, initial encounter 01/15/2018   Clostridium difficile colitis 01/14/2018   Coronary artery disease    Depression    some; don't take anything for it (02/20/2012), pt denies dx as of 06/02/21   Diverticulosis    DVT (deep venous thrombosis) (HCC)    Fibromyalgia 11/2011   Fracture of base of fifth metatarsal bone with routine healing, left    GAVE (gastric antral vascular ectasia)    GERD (gastroesophageal reflux disease)    Headache(784.0)    related  to allergies; more at different times during the year (02/20/2012)   Hemorrhoids    Hiatal hernia    back and neck   History of blood transfusion 2023   1 unit, 5 units of Iron    Hx of adenomatous colonic polyps 04/12/2016   Hypercholesteremia    good cholesterol is high   Hypothyroidism    h/o Graves disease   IBS (irritable bowel syndrome)    Moderate persistent asthma    -FeV1 72% 2011, -IgE 102 2011, CT sinus Neg 2011   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 Cindy Setter   Sleep apnea    uses cpap nightly   Tracheobronchomalacia    Vasculitis (HCC) 12/13/2017    Family History  Problem Relation Age of Onset   Allergies Mother    Heart disease Mother    Arthritis Mother    Lung cancer Mother        heavy smoker   Diabetes Mother    Cancer Mother    Miscarriages / Stillbirths Mother    Obesity Mother    Allergies Father    Heart disease Father    Arthritis Father    Stroke Father    Alcohol  abuse Father    Hypertension Father    Heart disease Brother    Diabetes Maternal Grandfather    Colitis Daughter    Colon cancer Other         Maternal half uncle   Heart disease Brother    Drug abuse Brother    Early death Brother    Drug abuse Brother    Past Surgical History:  Procedure Laterality Date   ABDOMINAL HYSTERECTOMY N/A    Phreesia 10/21/2019   ANTERIOR AND POSTERIOR REPAIR  1990's   APPENDECTOMY     ARGON LASER APPLICATION  02/05/2021   Procedure: ARGON LASER APPLICATION;  Surgeon: Leigh Elspeth SQUIBB, MD;  Location: [facility];  Service: Gastroenterology;;   BREAST LUMPECTOMY  1998   left   BREAST SURGERY N/A    Phreesia 10/21/2019   BRONCHIAL WASHINGS  04/05/2020   Procedure: BRONCHIAL WASHINGS;  Surgeon: Kara Dorn NOVAK, MD;  Location: [facility];  Service: Pulmonary;;   CAPSULOTOMY Right 08/28/2021   Procedure: CAPSULOTOMY;  Surgeon: Tobie Franky SQUIBB, DPM;  Location: [facility];  Service: Podiatry;  Laterality: Right;   CARPOMETACARPEL (CMC) FUSION OF THUMB WITH AUTOGRAFT FROM RADIUS  ~ 2009   both thumbs (02/20/2012)   CATARACT EXTRACTION W/ INTRAOCULAR LENS  IMPLANT, BILATERAL  2012   CERVICAL DISCECTOMY  10/2001   C5-C6   CERVICAL FUSION  2003   C3-C4   CHOLECYSTECTOMY     COLONOSCOPY     CORONARY STENT INTERVENTION N/A 01/11/2021   Procedure: CORONARY STENT INTERVENTION;  Surgeon: Anner Alm ORN, MD;  Location: [facility] INVASIVE CV LAB;  Service: Cardiovascular;  Laterality: N/A;   DEBRIDEMENT TENNIS ELBOW  ?1970's   right   ESOPHAGOGASTRODUODENOSCOPY     ESOPHAGOGASTRODUODENOSCOPY (EGD) WITH PROPOFOL  N/A 02/05/2021   Procedure: ESOPHAGOGASTRODUODENOSCOPY (EGD) WITH PROPOFOL ;  Surgeon: Leigh Elspeth SQUIBB, MD;  Location: [facility];  Service: Gastroenterology;  Laterality: N/A;   EYE SURGERY N/A    Phreesia 10/21/2019   HAMMER TOE SURGERY Right 08/28/2021   Procedure:  HAMMER TOE REPAIR 2ND TOE RIGHT FOOT;  Surgeon: Tobie Franky SQUIBB, DPM;  Location: [facility];  Service: Podiatry;  Laterality: Right;   HYSTERECTOMY     I & D EXTREMITY Right 09/22/2020   Procedure: IRRIGATION AND DEBRIDEMENT RIGHT  HAND AND ELBOW;  Surgeon: Josefina Chew, MD;  Location: [facility];  Service: Orthopedics;  Laterality: Right;   ICD GENERATOR REMOVAL N/A 08/21/2023   Procedure: ICD GENERATOR REMOVAL;  Surgeon: Waddell Danelle ORN, MD;  Location: [facility] INVASIVE CV LAB;  Service: Cardiovascular;  Laterality: N/A;   ICD IMPLANT N/A 08/27/2022   Procedure: ICD IMPLANT;  Surgeon: Nancey Eulas BRAVO, MD;  Location: [facility];  Service: Cardiovascular;  Laterality: N/A;   JOINT REPLACEMENT  02/15/2021   KNEE ARTHROPLASTY  ?8009'd   ?right; w/cartilage repair (02/20/2012)   LEAD EXTRACTION N/A 08/21/2023   Procedure: LEAD EXTRACTION;  Surgeon: Waddell Danelle ORN, MD;  Location: [facility];  Service: Cardiovascular;  Laterality: N/A;   MASS EXCISION Left 08/28/2021   Procedure: EXCISION OF SOFT TISSUE  MASS LEFT FOOT;  Surgeon: Tobie Franky SQUIBB, DPM;  Location: [facility];  Service: Podiatry;  Laterality: Left;   NASAL SEPTUM SURGERY  1980's   POSTERIOR CERVICAL FUSION/FORAMINOTOMY  2004   failed initial fusion; rewired  anterior neck (02/20/2012)   REVERSE SHOULDER ARTHROPLASTY Right 02/16/2020   Procedure: REVERSE SHOULDER ARTHROPLASTY;  Surgeon: Josefina Chew, MD;  Location: [facility];  Service: Orthopedics;  Laterality: Right;   RIGHT/LEFT HEART CATH AND CORONARY ANGIOGRAPHY N/A 01/11/2021   Procedure: RIGHT/LEFT HEART CATH AND CORONARY ANGIOGRAPHY;  Surgeon: Anner Alm ORN, MD;  Location: [facility] INVASIVE CV LAB;  Service: Cardiovascular;  Laterality: N/A;   SPINE SURGERY N/A    Phreesia 10/21/2019   TONSILLECTOMY  ~ 1953   TRANSESOPHAGEAL ECHOCARDIOGRAM (CATH LAB) N/A 08/12/2023   Procedure: TRANSESOPHAGEAL ECHOCARDIOGRAM;  Surgeon: Pietro Redell RAMAN, MD;  Location: [facility] INVASIVE CV LAB;  Service: Cardiovascular;  Laterality: N/A;   VESICOVAGINAL FISTULA CLOSURE W/ TAH  1988   VIDEO BRONCHOSCOPY Bilateral 08/23/2016   Procedure: VIDEO BRONCHOSCOPY WITH FLUORO;  Surgeon: Noreen Tonnie BRAVO, MD;  Location: [facility];  Service: Cardiopulmonary;  Laterality: Bilateral;   VIDEO BRONCHOSCOPY N/A 04/05/2020   Procedure: VIDEO BRONCHOSCOPY WITHOUT FLUORO;  Surgeon: Kara Dorn NOVAK, MD;  Location: [facility];  Service: Pulmonary;  Laterality: N/A;   Social History   Social History Narrative   Patient lives at home alone. Patient  divorced.    Patient has her BS degree.   Right handed.   Caffeine- sometimes coffee.      [facility]:   Born in [place], [place]. She worked as a Armed forces operational officer. She has no pets currently. She does have indoor plants. Previously had mold in her home that was remediated. Carpet was removed.          Immunization History  Administered Date(s) Administered   DTaP 08/18/2013   Fluad Quad(high Dose 65+) 10/16/2018, 11/20/2019, 11/03/2020   Hepatitis A 09/04/2007, 03/02/2008   Hepatitis B 01/07/1985, 02/06/1985, 08/17/1985   Influenza Split 11/13/2010, 11/22/2011, 10/20/2012   Influenza Whole 11/14/2009, 11/21/2011   Influenza, High Dose Seasonal PF 11/09/2015, 11/27/2017   Influenza, Quadrivalent, Recombinant, Inj, Pf 11/20/2019   Influenza,inj,Quad PF,6+ Mos 11/06/2013, 11/09/2014, 11/06/2016   Influenza-Unspecified 11/06/2021, 10/12/2022   Meningococcal Conjugate 09/04/2007   Moderna Covid-19 Fall Seasonal Vaccine 56yrs & older 01/08/2022   PFIZER(Purple Top)SARS-COV-2 Vaccination 03/12/2019, 04/01/2019, 11/20/2019, 12/16/2019, 06/29/2020   Pfizer Covid-19 Theatre manager  22yrs & up 03/02/2021   Pneumococcal Conjugate-13 05/18/2013   Pneumococcal Polysaccharide-23 01/05/1994, 11/28/2011, 05/27/2017   Respiratory Syncytial Virus Vaccine,Recomb Aduvanted(Arexvy) 11/06/2021   Td 07/24/1995, 03/16/2005   Tdap 08/18/2013   Zoster Recombinant(Shingrix) 09/18/2017   Zoster, Live 03/02/2008     Objective: Vital Signs: BP (!) 93/55 (BP Location: Right Arm, Patient Position: Sitting, Cuff Size: Normal)   Pulse 80   Resp 14   Ht 5' (1.524 m)    BMI 23.90 kg/m    Physical Exam Eyes:     Conjunctiva/sclera: Conjunctivae normal.  Cardiovascular:     Rate and Rhythm: Normal rate and regular rhythm.  Pulmonary:     Effort: Pulmonary effort is normal.     Breath sounds: Normal breath sounds.  Musculoskeletal:     Right lower leg: No edema.     Left lower leg: No edema.  Skin:    General: Skin is warm and dry.     Findings: Bruising present.     Comments: Some skin peeling areas on sole of foot particularly at right bottom and lateral midfoot  Neurological:     Mental Status: She is alert.  Psychiatric:        Mood and Affect: Mood normal.      Musculoskeletal Exam:  Left shoulder abduction mildly limited, tenderness to pressure and movement worse on posterior, no palpable effusion Elbows full ROM no tenderness or swelling Wrists full ROM no tenderness or swelling Fingers chronic bony joint changes proximal and distal, right 5th PIP widening and lateral deviation with decreased flexion ROM, reversible 1st MCP subluxation Knees full ROM, no swelling, right knee minimally tender anteriorly without visible bruising   Investigation: No additional findings.  Imaging: DG Chest 2 View Result Date: 08/22/2023 CLINICAL DATA:  Pacemaker infection. EXAM: CHEST - 2 VIEW COMPARISON:  08/06/2023.  Chest CT 07/31/2023. FINDINGS: The cardio pericardial silhouette is enlarged. Streaky and patchy opacity at the lung bases compatible with atelectasis. Irregular density in the right mid lung corresponding to an area of architectural distortion and parenchymal scarring described in the right upper lobe on recent chest CT. No pulmonary edema or substantial pleural effusion. Telemetry leads overlie the chest. IMPRESSION: 1. Streaky and patchy bibasilar opacities compatible with atelectasis. 2. Irregular density in the right mid lung corresponding to an area of architectural distortion and parenchymal scarring described in the right upper lobe on  recent chest CT. Electronically Signed   By: Camellia Candle M.D.   On: 08/22/2023 07:53   ECHO INTRAOPERATIVE TEE Result Date: 08/21/2023  *INTRAOPERATIVE TRANSESOPHAGEAL REPORT *  Patient Name:   Ebony Scott   Date of Exam: 08/21/2023 Medical Rec #:  [id number]      Height:       62.0 in Accession #:    [id number]     Weight:       122.4 lb Date of Birth:  03-Oct-1950       BSA:          1.55 m Patient Age:    73 years       BP:           137/72 mmHg Patient Gender: F              HR:           71 bpm. Exam Location:  Anesthesiology Transesophogeal exam was perform intraoperatively during surgical procedure. Patient was closely monitored under general anesthesia during the entirety of examination. Indications:  Lead Extraction Sonographer:     Tinnie Barefoot RDCS Performing Phys: [id number] Diagnosing Phys: Elsie Needle MD Complications: No known complications during this procedure. POST-OP IMPRESSIONS Overall, there were no significant changes from pre-bypass. The pericerdial effusion remains trivial and the TR may be slightly increased, but not significantly. PRE-OP FINDINGS  Left Ventricle: The left ventricle has severely reduced systolic function, with an ejection fraction of 20%. The cavity size was moderately dilated. Left ventricular diffuse hypokinesis. Right Ventricle: The right ventricle has mildly reduced systolic function. The cavity was normal. There is no increase in right ventricular wall thickness. Left Atrium: Left atrial size was not assessed. No left atrial/left atrial appendage thrombus was detected. Right Atrium: Right atrial size was not assessed. Interatrial Septum: No atrial level shunt detected by color flow Doppler. Pericardium: Trivial pericardial effusion is present. The pericardial effusion is localized near the right ventricle. Mitral Valve: The mitral valve is normal in structure. Mitral valve regurgitation is trivial by color flow Doppler. Tricuspid Valve: The  tricuspid valve was normal in structure. Tricuspid valve regurgitation is mild by color flow Doppler. Aortic Valve: The aortic valve is tricuspid Aortic valve regurgitation is trivial by color flow Doppler. Pulmonic Valve: The pulmonic valve was normal in structure. Pulmonic valve regurgitation is not visualized by color flow Doppler.  Elsie Needle MD Electronically signed by Elsie Needle MD Signature Date/Time: 08/21/2023/11:21:30 AM    Final    EP PPM/ICD IMPLANT Result Date: 08/21/2023 Conclusion: Successful extraction of a single-chamber ICD in a patient with enterococcal sepsis with no immediate procedural complications. Danelle Birmingham, MD    Recent Labs: Lab Results  Component Value Date   WBC 8.7 08/21/2023   HGB 8.6 (L) 08/21/2023   PLT 166 08/21/2023   NA 138 08/22/2023   K 4.0 08/22/2023   CL 108 08/22/2023   CO2 21 (L) 08/22/2023   GLUCOSE 85 08/22/2023   BUN 22 08/22/2023   CREATININE 1.61 (H) 08/22/2023   BILITOT 0.6 08/14/2023   ALKPHOS 36 (L) 08/14/2023   AST 38 08/14/2023   ALT 29 08/14/2023   PROT 6.2 (L) 08/14/2023   ALBUMIN 2.6 (L) 08/14/2023   CALCIUM  8.3 (L) 08/22/2023   GFRAA 40 (L) 08/25/2020    Speciality Comments: PLQ Eye Exam: 04/18/2023 WNL [facility] f/u 1 year  Procedures:  No procedures performed Allergies: Baclofen , Other, Pork-derived products, Shellfish allergy , Shrimp extract, Tetracycline hcl, Xolair  [omalizumab ], Zoledronic  acid, Celecoxib, Dilaudid  [hydromorphone  hcl], Hydrocodone -acetaminophen , Levofloxacin , Molds & smuts, Morphine  and codeine, Oxycodone  hcl, Paroxetine, Tetracycline hcl, Oxycodone -acetaminophen , Diltiazem  hcl, Dust mite extract, Gamunex [immune globulin ], Rituximab , Rofecoxib, and Tree extract   Assessment / Plan:     Visit Diagnoses: Rheumatoid arthritis with negative rheumatoid factor, involving unspecified site (HCC) - S/P Large Joint Inj: L glenohumeral on 05/13/2023 Chronic rheumatoid arthritis with  persistent pain in feet and fingers. No significant swelling or inflammation. IV antibiotics limit RA medication options. - Continue 5 mg daily prednisone . - Order Voltaren  topical gel twice daily at facility.  Long term (current) use of systemic steroids - Taper prednisone  from 7 mg to 5 mg, starting with 6 mg for 1-2 weeks before reducing to 5 mg. 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 Pain management limited by IV antibiotics. Order Voltaren  twice daily at facility.  Defibrillator removal Defibrillator and some leads removed.  General Health Maintenance Routine podiatric care deferred due to IV antibiotics and facility restrictions.        Orders: No orders  of the defined types were placed in this encounter.  No orders of the defined types were placed in this encounter.    Follow-Up Instructions: Return in about 6 months (around 03/15/2024) for Ra on GC f/u 6mos.   Lonni LELON Ester, MD  Note - This record has been created using [employer].  Chart creation errors have been sought, but may not always  have been located. Such creation errors do not reflect on  the standard of medical care.

## 2023-08-30 NOTE — Telephone Encounter (Signed)
Phone keeps ringing

## 2023-09-03 ENCOUNTER — Other Ambulatory Visit: Payer: Self-pay

## 2023-09-03 ENCOUNTER — Ambulatory Visit: Admitting: Infectious Diseases

## 2023-09-03 ENCOUNTER — Encounter: Payer: Self-pay | Admitting: Infectious Diseases

## 2023-09-03 ENCOUNTER — Telehealth: Payer: Self-pay

## 2023-09-03 VITALS — BP 106/52 | HR 77 | Temp 98.0°F

## 2023-09-03 DIAGNOSIS — Z79899 Other long term (current) drug therapy: Secondary | ICD-10-CM | POA: Diagnosis not present

## 2023-09-03 DIAGNOSIS — M0600 Rheumatoid arthritis without rheumatoid factor, unspecified site: Secondary | ICD-10-CM

## 2023-09-03 DIAGNOSIS — B952 Enterococcus as the cause of diseases classified elsewhere: Secondary | ICD-10-CM

## 2023-09-03 DIAGNOSIS — Z452 Encounter for adjustment and management of vascular access device: Secondary | ICD-10-CM | POA: Insufficient documentation

## 2023-09-03 DIAGNOSIS — I255 Ischemic cardiomyopathy: Secondary | ICD-10-CM

## 2023-09-03 DIAGNOSIS — T827XXD Infection and inflammatory reaction due to other cardiac and vascular devices, implants and grafts, subsequent encounter: Secondary | ICD-10-CM

## 2023-09-03 DIAGNOSIS — R7881 Bacteremia: Secondary | ICD-10-CM | POA: Diagnosis not present

## 2023-09-03 DIAGNOSIS — D801 Nonfamilial hypogammaglobulinemia: Secondary | ICD-10-CM

## 2023-09-03 NOTE — Progress Notes (Unsigned)
 Patient Active Problem List   Diagnosis Date Noted   Medication management 09/03/2023   PICC (peripherally inserted central catheter) in place 09/03/2023   Enterococcus faecalis infection 08/08/2023   Enterococcal bacteremia 08/08/2023   Community acquired pneumonia 08/06/2023   Generalized weakness 08/06/2023   AKI (acute kidney injury) (HCC) 08/06/2023   Acute metabolic acidosis 05/20/2023   RLL pneumonia 05/18/2023   Nocturnal hypoxia - 2-3 L/min 05/18/2023   Acute respiratory failure with hypoxia (HCC) 05/18/2023   Current chronic use of systemic steroids - prednisone  7 mg daily for hx of RA 05/18/2023   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   Sepsis without acute organ dysfunction (HCC) 11/30/2022   Fever 09/01/2022   DNR (do not resuscitate) 08/31/2022   Noninfectious otitis externa of left ear 06/08/2021   Iron  deficiency anemia 02/24/2021   GAVE (gastric antral vascular ectasia)    Anticoagulated    Antiplatelet or antithrombotic long-term use    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   Coronary artery calcification seen on CAT scan 01/11/2021   History of DVT (deep vein thrombosis) 09/24/2020   DMII (diabetes mellitus, type 2) (HCC) 09/23/2020   Hoarseness 09/09/2020   S/P reverse total shoulder arthroplasty, right    DOE (dyspnea on exertion) 02/18/2020   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   S/P reverse total shoulder arthroplasty, left 02/16/2020   History of pulmonary embolus (PE) 12/19/2018   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   Rheumatoid arthritis (HCC)    Bilateral impacted cerumen 08/18/2018   Hypothyroidism 01/14/2018   Depression 01/14/2018   Debility    Protein-calorie malnutrition, moderate (HCC) 12/17/2017   Knee pain    History of breast cancer    Chronic pain syndrome    Fibromyalgia    Graves disease    Dysphonia 08/10/2017   Pulmonary nodules    Chronic kidney disease, stage 3b (HCC) - baseline Scr 1.3-1.5 04/15/2016    Hx of adenomatous colonic polyps 04/12/2016   Opacity of lung on imaging study 11/03/2015   Severe persistent asthma 02/08/2015   Facial pain syndrome 02/08/2015   Insomnia 02/08/2015   Other allergic rhinitis 02/08/2015   LPRD (laryngopharyngeal reflux disease) 02/08/2015   Chronic systolic heart failure (HCC) 02/08/2015   Diastolic dysfunction 02/08/2015   Benign paroxysmal positional vertigo 12/03/2012   Disequilibrium 10/29/2012   Pseudogout 02/24/2012   Irritable bowel syndrome 01/02/2010   Hyperlipidemia 12/23/2009   Hyperthyroidism 11/12/2006   Seasonal and perennial allergic rhinitis 11/12/2006   GERD 11/12/2006   NECK PAIN, CHRONIC 11/12/2006   OSTEOPOROSIS 11/12/2006   BREAST CANCER, HX OF 11/12/2006    Patient's Medications  New Prescriptions   No medications on file  Previous Medications   ACETAMINOPHEN  (TYLENOL ) 500 MG TABLET    Take 1 tablet (500 mg total) by mouth every 6 (six) hours as needed for moderate pain (pain score 4-6) or mild pain (pain score 1-3).   ALBUTEROL  (PROVENTIL ) (2.5 MG/3ML) 0.083% NEBULIZER SOLUTION    Take 3 mLs (2.5 mg total) by nebulization every 4 (four) hours as needed for wheezing or shortness of breath.   ALBUTEROL  (VENTOLIN  HFA) 108 (90 BASE) MCG/ACT INHALER    Inhale 2 puffs into the lungs every 6 (six) hours as needed for wheezing or shortness of breath.   ALLOPURINOL  (ZYLOPRIM ) 100 MG TABLET    Take 1 tablet (100 mg total) by mouth daily.   ALPHA-D-GALACTOSIDASE (BEANO PO)    Take 2 tablets by mouth in the morning, at noon, and at bedtime.  ALPHA-LIPOIC ACID 600 MG TABS    Take 600 mg by mouth in the morning.   AMPICILLIN  IVPB    Inject 2 g into the vein every 8 (eight) hours for 28 days. Indication:  E faecalis bacteremia/ICD infection First Dose: Yes Last Day of Therapy:  09/19/23 Labs - Once weekly:  CBC/D and BMP, Labs - Once weekly: ESR and CRP   ATORVASTATIN  (LIPITOR) 80 MG TABLET    Take 1 tablet (80 mg total) by mouth  every evening.   AZELASTINE  HCL 137 MCG/SPRAY SOLN    2 sprays once per day   CALCIUM -MAGNESIUM -VITAMIN D  (CITRACAL CALCIUM +D) 600-40-500 MG-MG-UNIT TB24    Take 1 tablet by mouth daily.   CEFTRIAXONE  (ROCEPHIN ) IVPB    Inject 2 g into the vein every 12 (twelve) hours for 28 days. Indication:  E faecalis bacteremia/ICD infection First Dose: Yes Last Day of Therapy:  09/19/23 Labs - Once weekly:  CBC/D and BMP, Labs - Once weekly: ESR and CRP   CETIRIZINE  (ZYRTEC ) 10 MG TABLET    Take 1 tablet (10 mg total) by mouth daily as needed for allergies (Can take a n extra dose during flare ups.).   CHOLECALCIFEROL  (VITAMIN D3) 25 MCG (1000 UT) CAPSULE    Take 1,000 Units by mouth at bedtime.   DENOSUMAB  (PROLIA ) 60 MG/ML SOSY INJECTION    Inject 60 mg into the skin every 6 (six) months.   DEXLANSOPRAZOLE  (DEXILANT ) 60 MG CAPSULE    Take 1 capsule (60 mg total) by mouth in the morning.   DICLOFENAC  SODIUM (VOLTAREN ) 1 % GEL    Apply 1 Application topically as needed (pain).   EMPAGLIFLOZIN  (JARDIANCE ) 10 MG TABS TABLET    Take 1 tablet (10 mg total) by mouth daily.   ENTRESTO  24-26 MG    TAKE 1 TABLET BY MOUTH TWICE A DAY   EPINEPHRINE  0.3 MG/0.3 ML IJ SOAJ INJECTION    USE AS DIRECTED BY YOUR PHYSICIAN INTRAMUSCULARLY AS NEEDED FOR ANAPHYLAXIS   FAMOTIDINE  (PEPCID ) 20 MG TABLET    TAKE 1 TABLET (20 MG) BY MOUTH DAILY BEFORE LUNCH AND1 TABLET BY MOUTH EACH NIGHT AT BEDTIME   FLUTICASONE  (FLONASE ) 50 MCG/ACT NASAL SPRAY    Place 2 sprays into both nostrils daily.   FLUTICASONE -UMECLIDIN-VILANT (TRELEGY ELLIPTA ) 200-62.5-25 MCG/ACT AEPB    INHALE 1 INHALATION INTO THE LUNGS IN THE MORNING.   FOLIC ACID  (FOLVITE ) 1 MG TABLET    Take 1 tablet (1 mg total) by mouth daily.   FUROSEMIDE  (LASIX ) 40 MG TABLET    Take 0.5 tablets (20 mg total) by mouth daily as needed for fluid or edema (Weight gain, edema >3lbs). Monday and Thursday   GABAPENTIN  (NEURONTIN ) 300 MG CAPSULE    Take 1 capsule (300 mg total) by mouth  at bedtime.   GUAIFENESIN  (MUCINEX ) 600 MG 12 HR TABLET    Take 600 mg by mouth 2 (two) times daily.   HIZENTRA  10 GM/50ML SOLN    Inject 15 g into the skin once a week. 13 g Infusion   IPRATROPIUM-ALBUTEROL  (DUONEB) 0.5-2.5 (3) MG/3ML SOLN    Take 3 mLs by nebulization every 6 (six) hours as needed.   ISOSORBIDE -HYDRALAZINE  (BIDIL ) 20-37.5 MG TABLET    TAKE 1 TABLET BY MOUTH TWICE A DAY   LACTASE (LACTOSE FAST ACTING RELIEF PO)    Take 2 tablets by mouth 3 (three) times daily before meals.   MONTELUKAST  (SINGULAIR ) 10 MG TABLET    Take 1 tablet (10 mg  total) by mouth daily after supper.   MUPIROCIN  OINTMENT (BACTROBAN ) 2 %    Apply 1 Application topically as needed (wounds).   NYSTATIN  (MYCOSTATIN ) 100000 UNIT/ML SUSPENSION    Swish, gargle, and swallow 5 ml after Trelegy use   PREDNISONE  (DELTASONE ) 5 MG TABLET    Take 1 tablet (5 mg total) by mouth daily with breakfast.   PRENATAL VIT-FE FUMARATE-FA (PRENATAL VITAMINS PO)    Take 1 tablet by mouth in the morning.   PROBIOTIC PRODUCT (ALIGN) 4 MG CAPS    Take 4 mg by mouth every evening.   RESPIRATORY THERAPY SUPPLIES (NEBULIZER MASK ADULT) MISC    1 Device by Does not apply route as directed.   RIVAROXABAN  (XARELTO ) 20 MG TABS TABLET    Take 1 tablet (20 mg total) by mouth daily with supper.   SODIUM BICARBONATE  650 MG TABLET    Take 650 mg by mouth daily.   SODIUM CHLORIDE  HYPERTONIC 3 % NEBULIZER SOLUTION    Take 4 mLs by nebulization at bedtime.  Modified Medications   No medications on file  Discontinued Medications   No medications on file    Subjective: Discussed the use of AI scribe software for clinical note transcription with the patient, who gave verbal consent to proceed.   73 year old female with prior history of RA on chronic prednisone , breast cancer in remission, history of C. 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 Discharged on 7/3 to complete 6 weeks of IV ampicillin  and ceftriaxone  through 7/31 after removal of PPM on 7/2.    7/15 She is here by  herself from SNF. Mar reviewed which confirms she is on IV ampicillin  and ceftriaxone . No issues with antibiotics or PICC. Prior PPM site sore but no other concerns.   Review of Systems: all systems reviewed  with pertinent positives and negatives as listed above   Past Medical History:  Diagnosis Date   Anemia    Angio-edema    Breast cancer (HCC) 1998   Left breast, in remission   Bronchiectasis (HCC)    CHF (congestive heart failure) (HCC)    Closed displaced fracture of metatarsal bone of right foot 01/20/2019   Closed fracture of fifth metatarsal bone of right foot, initial encounter 01/15/2018   Clostridium difficile colitis 01/14/2018   Coronary artery disease    Depression    some; don't take anything for it (02/20/2012), pt denies dx as of 06/02/21   Diverticulosis    DVT (deep venous thrombosis) (HCC)    Fibromyalgia 11/2011   Fracture of base of fifth metatarsal bone with routine healing, left    GAVE (gastric antral vascular ectasia)    GERD (gastroesophageal reflux disease)    Headache(784.0)    related to allergies; more at different times during the year (02/20/2012)   Hemorrhoids    Hiatal hernia    back and neck   History of blood transfusion 2023   1 unit, 5 units of Iron    Hx of adenomatous colonic polyps 04/12/2016   Hypercholesteremia    good cholesterol is high   Hypothyroidism    h/o Graves disease   IBS (irritable bowel syndrome)    Moderate persistent asthma    -  FeV1 72% 2011, -IgE 102 2011, CT sinus Neg 2011   Osteomyelitis of second toe of right foot (HCC) 09/23/2020    Osteoporosis    on reclast  yearly   Pneumonia    Pulmonary embolism (HCC) 07/29/2018   Septic olecranon bursitis of right elbow 09/22/2020   Seronegative rheumatoid arthritis (HCC)    Dr. Cindy Setter   Sleep apnea    uses cpap nightly   Tracheobronchomalacia    Vasculitis (HCC) 12/13/2017   Past Surgical History:  Procedure Laterality Date   ABDOMINAL HYSTERECTOMY N/A    Phreesia 10/21/2019   ANTERIOR AND POSTERIOR REPAIR  1990's   APPENDECTOMY     ARGON LASER APPLICATION  02/05/2021   Procedure: ARGON LASER APPLICATION;  Surgeon: Leigh Elspeth SQUIBB, MD;  Location: [facility];  Service: Gastroenterology;;   BREAST LUMPECTOMY  1998   left   BREAST SURGERY N/A    Phreesia 10/21/2019   BRONCHIAL WASHINGS  04/05/2020   Procedure: BRONCHIAL WASHINGS;  Surgeon: Kara Dorn NOVAK, MD;  Location: [facility];  Service: Pulmonary;;   CAPSULOTOMY Right 08/28/2021   Procedure: CAPSULOTOMY;  Surgeon: Tobie Franky SQUIBB, DPM;  Location: [facility];  Service: Podiatry;  Laterality: Right;   CARPOMETACARPEL (CMC) FUSION OF THUMB WITH AUTOGRAFT FROM RADIUS  ~ 2009   both thumbs (02/20/2012)   CATARACT EXTRACTION W/ INTRAOCULAR LENS  IMPLANT, BILATERAL  2012   CERVICAL DISCECTOMY  10/2001   C5-C6   CERVICAL FUSION  2003   C3-C4   CHOLECYSTECTOMY     COLONOSCOPY     CORONARY STENT INTERVENTION N/A 01/11/2021   Procedure: CORONARY STENT INTERVENTION;  Surgeon: Anner Alm ORN, MD;  Location: [facility] INVASIVE CV LAB;  Service: Cardiovascular;  Laterality: N/A;   DEBRIDEMENT TENNIS ELBOW  ?1970's   right   ESOPHAGOGASTRODUODENOSCOPY     ESOPHAGOGASTRODUODENOSCOPY (EGD) WITH PROPOFOL  N/A 02/05/2021   Procedure: ESOPHAGOGASTRODUODENOSCOPY (EGD) WITH PROPOFOL ;  Surgeon: Leigh Elspeth SQUIBB, MD;  Location: [facility];  Service: Gastroenterology;  Laterality: N/A;   EYE SURGERY N/A    Phreesia 10/21/2019   HAMMER TOE SURGERY Right 08/28/2021   Procedure: HAMMER TOE REPAIR 2ND TOE RIGHT FOOT;   Surgeon: Tobie Franky SQUIBB, DPM;  Location: [facility];  Service: Podiatry;  Laterality: Right;   HYSTERECTOMY     I & D EXTREMITY Right 09/22/2020   Procedure: IRRIGATION AND DEBRIDEMENT RIGHT HAND AND ELBOW;  Surgeon: Josefina Chew, MD;  Location: [facility];  Service: Orthopedics;  Laterality: Right;   ICD GENERATOR REMOVAL N/A 08/21/2023   Procedure: ICD GENERATOR REMOVAL;  Surgeon: Waddell Danelle ORN, MD;  Location: [facility] INVASIVE CV LAB;  Service: Cardiovascular;  Laterality: N/A;   ICD IMPLANT N/A 08/27/2022   Procedure: ICD IMPLANT;  Surgeon: Nancey Eulas BRAVO, MD;  Location: [facility];  Service: Cardiovascular;  Laterality: N/A;   KNEE ARTHROPLASTY  ?8009'd   ?right; w/cartilage repair (02/20/2012)   LEAD EXTRACTION N/A 08/21/2023   Procedure: LEAD EXTRACTION;  Surgeon: Waddell Danelle ORN, MD;  Location: [facility] INVASIVE CV LAB;  Service: Cardiovascular;  Laterality: N/A;   MASS EXCISION Left 08/28/2021   Procedure: EXCISION OF SOFT TISSUE  MASS LEFT FOOT;  Surgeon: Tobie Franky SQUIBB, DPM;  Location: [facility];  Service: Podiatry;  Laterality: Left;   NASAL SEPTUM SURGERY  1980's   POSTERIOR CERVICAL FUSION/FORAMINOTOMY  2004   failed initial fusion; rewired  anterior neck (02/20/2012)   REVERSE SHOULDER ARTHROPLASTY Right 02/16/2020   Procedure: REVERSE SHOULDER ARTHROPLASTY;  Surgeon: Josefina Chew,  MD;  Location: [facility];  Service: Orthopedics;  Laterality: Right;   RIGHT/LEFT HEART CATH AND CORONARY ANGIOGRAPHY N/A 01/11/2021   Procedure: RIGHT/LEFT HEART CATH AND CORONARY ANGIOGRAPHY;  Surgeon: Anner Alm ORN, MD;  Location: [facility] INVASIVE CV LAB;  Service: Cardiovascular;  Laterality: N/A;   SPINE SURGERY N/A    Phreesia 10/21/2019   TONSILLECTOMY  ~ 1953   TRANSESOPHAGEAL ECHOCARDIOGRAM (CATH LAB) N/A 08/12/2023   Procedure: TRANSESOPHAGEAL ECHOCARDIOGRAM;  Surgeon: Pietro Redell RAMAN, MD;  Location: [facility] INVASIVE CV LAB;  Service: Cardiovascular;  Laterality: N/A;   VESICOVAGINAL FISTULA CLOSURE W/ TAH  1988    VIDEO BRONCHOSCOPY Bilateral 08/23/2016   Procedure: VIDEO BRONCHOSCOPY WITH FLUORO;  Surgeon: Noreen Tonnie BRAVO, MD;  Location: [facility];  Service: Cardiopulmonary;  Laterality: Bilateral;   VIDEO BRONCHOSCOPY N/A 04/05/2020   Procedure: VIDEO BRONCHOSCOPY WITHOUT FLUORO;  Surgeon: Kara Dorn NOVAK, MD;  Location: [facility];  Service: Pulmonary;  Laterality: N/A;     Social History   Tobacco Use   Smoking status: Never    Passive exposure: Past   Smokeless tobacco: Never   Tobacco comments:    Parents  Vaping Use   Vaping status: Never Used  Substance Use Topics   Alcohol  use: Not Currently    Alcohol /week: 0.0 standard drinks of alcohol    Drug use: No    Family History  Problem Relation Age of Onset   Allergies Mother    Heart disease Mother    Arthritis Mother    Lung cancer Mother        heavy smoker   Diabetes Mother    Allergies Father    Heart disease Father    Arthritis Father    Stroke Father    Heart disease Brother    Diabetes Maternal Grandfather    Colitis Daughter    Colon cancer Other        Maternal half uncle    Allergies  Allergen Reactions   Baclofen  Anaphylaxis and Other (See Comments)    Altered mental status requiring a 3-day hospital stay- patient became unresponsive   Put me in a coma for five days, Altered mental status requiring a 3-day hospital stay- patient became unresponsive   Other Shortness Of Breath and Other (See Comments)    Grass and weeds sneezing; filled sinuses (02/20/2012)   Pork-Derived Products Anaphylaxis   Shellfish Allergy  Anaphylaxis, Shortness Of Breath, Itching and Rash   Shrimp Extract Anaphylaxis and Other (See Comments)    ALL SHELLFISH   Tetracycline Hcl Nausea And Vomiting   Xolair  [Omalizumab ] Other (See Comments)    Caused Blood clot   Zoledronic  Acid Other (See Comments)    Reclast - Fever, Put in hospital, dr said it was a reaction from a reaction    Fever, inability to walk, Put in  hospital  Fever, Put in hospital, dr said it was a reaction from a reaction , Other reaction(s): Unknown   Celecoxib Swelling    Feet swelling   Dilaudid  [Hydromorphone  Hcl] Itching   Hydrocodone -Acetaminophen  Itching   Levofloxacin  Other (See Comments)    GI upset   Molds & Smuts Cough   Morphine  And Codeine Hives and Itching   Oxycodone  Hcl Nausea And Vomiting    GI Intolerance   Paroxetine Nausea And Vomiting and Other (See Comments)    GI Intolerance also   Tetracycline Hcl Other (See Comments)    GI Intolerance   Oxycodone -Acetaminophen  Itching   Diltiazem  Hcl Swelling   Dust Mite Extract  Other (See Comments) and Cough    sneezing (02/20/2012) too   Gamunex [Immune Globulin ] Itching and Rash    Tolerates hizentra     Rituximab  Other (See Comments)    Anemia    Rofecoxib Swelling    Vioxx- feet swelling   Tree Extract Itching    tested and told I was allergic to it; never experienced a reaction to it (02/20/2012)    Health Maintenance  Topic Date Due   FOOT EXAM  Never done   Diabetic kidney evaluation - Urine ACR  Never done   Zoster Vaccines- Shingrix (2 of 2) 11/13/2017   COVID-19 Vaccine (8 - 2024-25 season) 10/21/2022   DTaP/Tdap/Td (5 - Td or Tdap) 08/19/2023   INFLUENZA VACCINE  09/20/2023   Medicare Annual Wellness (AWV)  11/22/2023   HEMOGLOBIN A1C  02/06/2024   MAMMOGRAM  04/17/2024   OPHTHALMOLOGY EXAM  04/17/2024   Diabetic kidney evaluation - eGFR measurement  08/21/2024   Pneumococcal Vaccine: 50+ Years  Completed   Hepatitis B Vaccines  Completed   DEXA SCAN  Completed   Hepatitis C Screening  Completed   HPV VACCINES  Aged Out   Meningococcal B Vaccine  Aged Out   Colonoscopy  Discontinued    Objective: BP (!) 106/52   Pulse 77   Temp 98 F (36.7 C) (Temporal)   SpO2 94%    Physical Exam Constitutional:      Appearance: Normal appearance.  HENT:     Head: Normocephalic and atraumatic.      Mouth: Mucous membranes are moist.   Eyes:    Conjunctiva/sclera: Conjunctivae normal.     Pupils: Pupils are equal, round, and b/l symmetrical    Cardiovascular:     Rate and Rhythm: Normal rate and regular rhythm.     Heart sounds:   Pulmonary:     Effort: Pulmonary effort is normal.     Breath sounds:  Abdominal:     General: Non distended     Palpations:   Musculoskeletal:        General: sitting in the wheelchair  Skin:    General: Skin is warm and dry.     Comments: rt arm PICC Ok with no signs of infection. Left chest PPM with clean bandage C/D/I  Neurological:     General: grossly non focal     Mental Status: awake, alert and oriented to person, place, and time.   Psychiatric:        Mood and Affect: Mood normal.   Lab Results Lab Results  Component Value Date   WBC 8.7 08/21/2023   HGB 8.6 (L) 08/21/2023   HCT 27.8 (L) 08/21/2023   MCV 101.8 (H) 08/21/2023   PLT 166 08/21/2023    Lab Results  Component Value Date   CREATININE 1.61 (H) 08/22/2023   BUN 22 08/22/2023   NA 138 08/22/2023   K 4.0 08/22/2023   CL 108 08/22/2023   CO2 21 (L) 08/22/2023    Lab Results  Component Value Date   ALT 29 08/14/2023   AST 38 08/14/2023   ALKPHOS 36 (L) 08/14/2023   BILITOT 0.6 08/14/2023    Lab Results  Component Value Date   CHOL 123 09/11/2022   HDL 56 09/11/2022   LDLCALC 39 09/11/2022   LDLDIRECT 48 08/07/2022   TRIG 223 (H) 09/11/2022   CHOLHDL 2.2 09/11/2022   No results found for: LABRPR, RPRTITER No results found for: HIV1RNAQUANT, HIV1RNAVL, CD4TABS  Microbiology Results for orders  placed or performed during the hospital encounter of 08/06/23  Resp panel by RT-PCR (RSV, Flu A&B, Covid) Anterior Nasal Swab     Status: None   Collection Time: 08/06/23  4:04 PM   Specimen: Anterior Nasal Swab  Result Value Ref Range Status   SARS Coronavirus 2 by RT PCR NEGATIVE NEGATIVE Final    Comment: (NOTE) SARS-CoV-2 target nucleic acids are NOT DETECTED.  The SARS-CoV-2 RNA  is generally detectable in upper respiratory specimens during the acute phase of infection. The lowest concentration of SARS-CoV-2 viral copies this assay can detect is 138 copies/mL. A negative result does not preclude SARS-Cov-2 infection and should not be used as the sole basis for treatment or other patient management decisions. A negative result may occur with  improper specimen collection/handling, submission of specimen other than nasopharyngeal swab, presence of viral mutation(s) within the areas targeted by this assay, and inadequate number of viral copies(<138 copies/mL). A negative result must be combined with clinical observations, patient history, and epidemiological information. The expected result is Negative.  Fact Sheet for Patients:  BloggerCourse.com  Fact Sheet for Healthcare Providers:  SeriousBroker.it  This test is no t yet approved or cleared by the [place]  FDA and  has been authorized for detection and/or diagnosis of SARS-CoV-2 by FDA under an Emergency Use Authorization (EUA). This EUA will remain  in effect (meaning this test can be used) for the duration of the COVID-19 declaration under Section 564(b)(1) of the Act, 21 U.S.C.section 360bbb-3(b)(1), unless the authorization is terminated  or revoked sooner.       Influenza A by PCR NEGATIVE NEGATIVE Final   Influenza B by PCR NEGATIVE NEGATIVE Final    Comment: (NOTE) The Xpert Xpress SARS-CoV-2/FLU/RSV plus assay is intended as an aid in the diagnosis of influenza from Nasopharyngeal swab specimens and should not be used as a sole basis for treatment. Nasal washings and aspirates are unacceptable for Xpert Xpress SARS-CoV-2/FLU/RSV testing.  Fact Sheet for Patients: BloggerCourse.com  Fact Sheet for Healthcare Providers: SeriousBroker.it  This test is not yet approved or cleared by the  [place]  FDA and has been authorized for detection and/or diagnosis of SARS-CoV-2 by FDA under an Emergency Use Authorization (EUA). This EUA will remain in effect (meaning this test can be used) for the duration of the COVID-19 declaration under Section 564(b)(1) of the Act, 21 U.S.C. section 360bbb-3(b)(1), unless the authorization is terminated or revoked.     Resp Syncytial Virus by PCR NEGATIVE NEGATIVE Final    Comment: (NOTE) Fact Sheet for Patients: BloggerCourse.com  Fact Sheet for Healthcare Providers: SeriousBroker.it  This test is not yet approved or cleared by the [place]  FDA and has been authorized for detection and/or diagnosis of SARS-CoV-2 by FDA under an Emergency Use Authorization (EUA). This EUA will remain in effect (meaning this test can be used) for the duration of the COVID-19 declaration under Section 564(b)(1) of the Act, 21 U.S.C. section 360bbb-3(b)(1), unless the authorization is terminated or revoked.  Performed at [facility], [address], [place], [place] [address]   Blood Culture (routine x 2)     Status: Abnormal   Collection Time: 08/06/23  6:41 PM   Specimen: Site Not Specified; Blood  Result Value Ref Range Status   Specimen Description   Final    SITE NOT SPECIFIED Performed at [facility], [address], [place], [place] [address]    Special Requests   Final  BOTTLES DRAWN AEROBIC ONLY Blood Culture results may not be optimal due to an inadequate volume of blood received in culture bottles Performed at [facility], [address], [place], [place] [address]    Culture  Setup Time   Final    GRAM POSITIVE COCCI IN CHAINS AEROBIC BOTTLE ONLY CRITICAL RESULT CALLED TO, READ BACK BY AND VERIFIED WITH: PHARMD TERRY GREEN ON 08/07/23 @ 1227 BY DRT Performed at [facility], [address] [place], [place]  [address]    Culture ENTEROCOCCUS FAECALIS (A)  Final   Report Status 08/09/2023 FINAL  Final   Organism ID, Bacteria ENTEROCOCCUS FAECALIS  Final      Susceptibility   Enterococcus faecalis - MIC*    AMPICILLIN  <=2 SENSITIVE Sensitive     VANCOMYCIN  1 SENSITIVE Sensitive     GENTAMICIN  SYNERGY SENSITIVE Sensitive     * ENTEROCOCCUS FAECALIS  Blood Culture ID Panel (Reflexed)     Status: Abnormal   Collection Time: 08/06/23  6:41 PM  Result Value Ref Range Status   Enterococcus faecalis DETECTED (A) NOT DETECTED Final    Comment: CRITICAL RESULT CALLED TO, READ BACK BY AND VERIFIED WITH: PHARMD TERRY GREEN ON 08/07/23 @ 1231 BY DRT    Enterococcus Faecium NOT DETECTED NOT DETECTED Final   Listeria monocytogenes NOT DETECTED NOT DETECTED Final   Staphylococcus species NOT DETECTED NOT DETECTED Final   Staphylococcus aureus (BCID) NOT DETECTED NOT DETECTED Final   Staphylococcus epidermidis NOT DETECTED NOT DETECTED Final   Staphylococcus lugdunensis NOT DETECTED NOT DETECTED Final   Streptococcus species NOT DETECTED NOT DETECTED Final   Streptococcus agalactiae NOT DETECTED NOT DETECTED Final   Streptococcus pneumoniae NOT DETECTED NOT DETECTED Final   Streptococcus pyogenes NOT DETECTED NOT DETECTED Final   A.calcoaceticus-baumannii NOT DETECTED NOT DETECTED Final   Bacteroides fragilis NOT DETECTED NOT DETECTED Final   Enterobacterales NOT DETECTED NOT DETECTED Final   Enterobacter cloacae complex NOT DETECTED NOT DETECTED Final   Escherichia coli NOT DETECTED NOT DETECTED Final   Klebsiella aerogenes NOT DETECTED NOT DETECTED Final   Klebsiella oxytoca NOT DETECTED NOT DETECTED Final   Klebsiella pneumoniae NOT DETECTED NOT DETECTED Final   Proteus species NOT DETECTED NOT DETECTED Final   Salmonella species NOT DETECTED NOT DETECTED Final   Serratia marcescens NOT DETECTED NOT DETECTED Final   Haemophilus influenzae NOT DETECTED NOT DETECTED Final   Neisseria meningitidis NOT  DETECTED NOT DETECTED Final   Pseudomonas aeruginosa NOT DETECTED NOT DETECTED Final   Stenotrophomonas maltophilia NOT DETECTED NOT DETECTED Final   Candida albicans NOT DETECTED NOT DETECTED Final   Candida auris NOT DETECTED NOT DETECTED Final   Candida glabrata NOT DETECTED NOT DETECTED Final   Candida krusei NOT DETECTED NOT DETECTED Final   Candida parapsilosis NOT DETECTED NOT DETECTED Final   Candida tropicalis NOT DETECTED NOT DETECTED Final   Cryptococcus neoformans/gattii NOT DETECTED NOT DETECTED Final   Vancomycin  resistance NOT DETECTED NOT DETECTED Final    Comment: Performed at [facility], [address], [place], [place] [address]  Respiratory (~20 pathogens) panel by PCR     Status: None   Collection Time: 08/06/23  8:31 PM   Specimen: Nasal Mucosa; Respiratory  Result Value Ref Range Status   Adenovirus NOT DETECTED NOT DETECTED Final   Coronavirus 229E NOT DETECTED NOT DETECTED Final    Comment: (NOTE) The Coronavirus on the Respiratory Panel, DOES NOT test for the novel  Coronavirus (  2019 nCoV)    Coronavirus HKU1 NOT DETECTED NOT DETECTED Final   Coronavirus NL63 NOT DETECTED NOT DETECTED Final   Coronavirus OC43 NOT DETECTED NOT DETECTED Final   Metapneumovirus NOT DETECTED NOT DETECTED Final   Rhinovirus / Enterovirus NOT DETECTED NOT DETECTED Final   Influenza A NOT DETECTED NOT DETECTED Final   Influenza B NOT DETECTED NOT DETECTED Final   Parainfluenza Virus 1 NOT DETECTED NOT DETECTED Final   Parainfluenza Virus 2 NOT DETECTED NOT DETECTED Final   Parainfluenza Virus 3 NOT DETECTED NOT DETECTED Final   Parainfluenza Virus 4 NOT DETECTED NOT DETECTED Final   Respiratory Syncytial Virus NOT DETECTED NOT DETECTED Final   Bordetella pertussis NOT DETECTED NOT DETECTED Final   Bordetella Parapertussis NOT DETECTED NOT DETECTED Final   Chlamydophila pneumoniae NOT DETECTED NOT DETECTED Final   Mycoplasma pneumoniae NOT DETECTED NOT DETECTED Final     Comment: Performed at [facility], [address], [place], [place] [address]  MRSA Next Gen by PCR, Nasal     Status: None   Collection Time: 08/06/23  8:31 PM   Specimen: Nasal Mucosa; Nasal Swab  Result Value Ref Range Status   MRSA by PCR Next Gen NOT DETECTED NOT DETECTED Final    Comment: (NOTE) The GeneXpert MRSA Assay (FDA approved for NASAL specimens only), is one component of a comprehensive MRSA colonization surveillance program. It is not intended to diagnose MRSA infection nor to guide or monitor treatment for MRSA infections. Test performance is not FDA approved in patients less than 31 years old. Performed at [facility], [address], [place], [place] [address]   Blood Culture (routine x 2)     Status: Abnormal   Collection Time: 08/06/23  8:43 PM   Specimen: BLOOD RIGHT HAND  Result Value Ref Range Status   Specimen Description   Final    BLOOD RIGHT HAND Performed at [facility], [address], [place], [place] [address]    Special Requests   Final    BOTTLES DRAWN AEROBIC AND ANAEROBIC Blood Culture results may not be optimal due to an inadequate volume of blood received in culture bottles Performed at [facility], [address], [place], [place] [address]    Culture  Setup Time   Final    GRAM POSITIVE COCCI IN CHAINS ANAEROBIC BOTTLE ONLY CRITICAL VALUE NOTED.  VALUE IS CONSISTENT WITH PREVIOUSLY REPORTED AND CALLED VALUE. GRAM POSITIVE COCCI IN CLUSTERS AEROBIC BOTTLE ONLY Organism ID to follow    Culture (A)  Final    ENTEROCOCCUS FAECALIS SUSCEPTIBILITIES PERFORMED ON PREVIOUS CULTURE WITHIN THE LAST 5 DAYS. STAPHYLOCOCCUS EPIDERMIDIS THE SIGNIFICANCE OF ISOLATING THIS ORGANISM FROM A SINGLE SET OF BLOOD CULTURES WHEN MULTIPLE SETS ARE DRAWN IS UNCERTAIN. PLEASE NOTIFY THE MICROBIOLOGY DEPARTMENT WITHIN ONE WEEK IF SPECIATION AND SENSITIVITIES ARE REQUIRED. Performed at [facility],  [address], [place], [place] [address]    Report Status 08/09/2023 FINAL  Final  Culture, blood (Routine X 2) w Reflex to ID Panel     Status: None   Collection Time: 08/08/23  1:11 PM   Specimen: BLOOD RIGHT HAND  Result Value Ref Range Status   Specimen Description BLOOD RIGHT HAND  Final   Special Requests   Final    BOTTLES DRAWN AEROBIC ONLY Blood Culture adequate volume   Culture   Final    NO GROWTH 5 DAYS Performed at [facility],  [address] [place], [place] [address]    Report Status 08/13/2023 FINAL  Final  Culture, blood (Routine X 2) w Reflex to ID Panel     Status: None   Collection Time: 08/08/23  1:12 PM   Specimen: BLOOD RIGHT HAND  Result Value Ref Range Status   Specimen Description BLOOD RIGHT HAND  Final   Special Requests   Final    BOTTLES DRAWN AEROBIC ONLY Blood Culture adequate volume   Culture   Final    NO GROWTH 5 DAYS Performed at [facility], [address], [place], [place] [address]    Report Status 08/13/2023 FINAL  Final  Surgical pcr screen     Status: None   Collection Time: 08/20/23 12:01 PM   Specimen: Nasal Mucosa; Nasal Swab  Result Value Ref Range Status   MRSA, PCR NEGATIVE NEGATIVE Final   Staphylococcus aureus NEGATIVE NEGATIVE Final    Comment: (NOTE) The Xpert SA Assay (FDA approved for NASAL specimens in patients 50 years of age and older), is one component of a comprehensive surveillance program. It is not intended to diagnose infection nor to guide or monitor treatment. Performed at [facility], [address] [place], [place] [address]    *Note: Due to a large number of results and/or encounters for the requested time period, some results have not been displayed. A complete set of results can be found in Results Review.   Imaging DG Chest 2 View Result Date: 08/22/2023 CLINICAL DATA:  Pacemaker infection. EXAM: CHEST - 2 VIEW COMPARISON:  08/06/2023.  Chest CT 07/31/2023. FINDINGS: The cardio  pericardial silhouette is enlarged. Streaky and patchy opacity at the lung bases compatible with atelectasis. Irregular density in the right mid lung corresponding to an area of architectural distortion and parenchymal scarring described in the right upper lobe on recent chest CT. No pulmonary edema or substantial pleural effusion. Telemetry leads overlie the chest. IMPRESSION: 1. Streaky and patchy bibasilar opacities compatible with atelectasis. 2. Irregular density in the right mid lung corresponding to an area of architectural distortion and parenchymal scarring described in the right upper lobe on recent chest CT. Electronically Signed   By: Camellia Candle M.D.   On: 08/22/2023 07:53   ECHO INTRAOPERATIVE TEE Result Date: 08/21/2023  *INTRAOPERATIVE TRANSESOPHAGEAL REPORT *  Patient Name:   Ebony Scott   Date of Exam: 08/21/2023 Medical Rec #:  [id number]      Height:       62.0 in Accession #:    [id number]     Weight:       122.4 lb Date of Birth:  10-Jun-1950       BSA:          1.55 m Patient Age:    73 years       BP:           137/72 mmHg Patient Gender: F              HR:           71 bpm. Exam Location:  Anesthesiology Transesophogeal exam was perform intraoperatively during surgical procedure. Patient was closely monitored under general anesthesia during the entirety of examination. Indications:     Lead Extraction Sonographer:     Tinnie Barefoot RDCS Performing Phys: [id number] T TAYLOR Diagnosing Phys: Elsie Needle MD Complications: No known complications during this procedure. POST-OP IMPRESSIONS Overall, there were no significant changes from pre-bypass. The pericerdial effusion  remains trivial and the TR may be slightly increased, but not significantly. PRE-OP FINDINGS  Left Ventricle: The left ventricle has severely reduced systolic function, with an ejection fraction of 20%. The cavity size was moderately dilated. Left ventricular diffuse hypokinesis. Right Ventricle: The right ventricle  has mildly reduced systolic function. The cavity was normal. There is no increase in right ventricular wall thickness. Left Atrium: Left atrial size was not assessed. No left atrial/left atrial appendage thrombus was detected. Right Atrium: Right atrial size was not assessed. Interatrial Septum: No atrial level shunt detected by color flow Doppler. Pericardium: Trivial pericardial effusion is present. The pericardial effusion is localized near the right ventricle. Mitral Valve: The mitral valve is normal in structure. Mitral valve regurgitation is trivial by color flow Doppler. Tricuspid Valve: The tricuspid valve was normal in structure. Tricuspid valve regurgitation is mild by color flow Doppler. Aortic Valve: The aortic valve is tricuspid Aortic valve regurgitation is trivial by color flow Doppler. Pulmonic Valve: The pulmonic valve was normal in structure. Pulmonic valve regurgitation is not visualized by color flow Doppler.  Elsie Needle MD Electronically signed by Elsie Needle MD Signature Date/Time: 08/21/2023/11:21:30 AM    Final    EP PPM/ICD IMPLANT Result Date: 08/21/2023 Conclusion: Successful extraction of a single-chamber ICD in a patient with enterococcal sepsis with no immediate procedural complications. Danelle Birmingham, MD   US  EKG SITE RITE Result Date: 08/14/2023 If [facility] image not attached, placement could not be confirmed due to current cardiac rhythm.  ECHO TEE Result Date: 08/12/2023    TRANSESOPHOGEAL ECHO REPORT   Patient Name:   Ebony Scott Date of Exam: 08/12/2023 Medical Rec #:  [id number]    Height:       62.0 in Accession #:    [id number]   Weight:       122.4 lb Date of Birth:  [birth year]     BSA:          1.551 m Patient Age:    73 years     BP:           146/61 mmHg Patient Gender: F            HR:           70 bpm. Exam Location:  Inpatient Procedure: Transesophageal Echo, Cardiac Doppler and Color Doppler (Both            Spectral and Color Flow Doppler were  utilized during procedure). Indications:    Endocarditis  History:        Patient has prior history of Echocardiogram examinations, most                 recent 08/08/2023. Risk Factors:Hypertension.  Sonographer:    Philomena Daring Referring Phys: [id number] [facility] PROCEDURE: After discussion of the risks and benefits of a TEE, an informed consent was obtained from the patient. The transesophogeal probe was passed without difficulty through the esophogus of the patient. Imaged were obtained with the patient in a left lateral decubitus position. Sedation performed by different physician. Image quality was good. The patient's vital signs; including heart rate, blood pressure, and oxygen  saturation; remained stable throughout the procedure. The patient developed no  complications during the procedure.  IMPRESSIONS  1. Akinesis of the distal anteroseptal wall and apex with overall severe LV dysfunction; no vegetations noted on RV lead or valves.  2. Left ventricular ejection fraction, by estimation, is 25 to 30%. The left ventricle has severely decreased  function. The left ventricle demonstrates regional wall motion abnormalities (see scoring diagram/findings for description). The left ventricular internal cavity size was mildly dilated.  3. Right ventricular systolic function is normal. The right ventricular size is normal.  4. Left atrial size was moderately dilated. No left atrial/left atrial appendage thrombus was detected.  5. The mitral valve is normal in structure. Trivial mitral valve regurgitation.  6. The aortic valve is tricuspid. Aortic valve regurgitation is trivial.  7. There is Moderate (Grade III) plaque involving the descending aorta. Conclusion(s)/Recommendation(s): Normal biventricular function without evidence of hemodynamically significant valvular heart disease. FINDINGS  Left Ventricle: Left ventricular ejection fraction, by estimation, is 25 to 30%. The left ventricle has severely decreased  function. The left ventricle demonstrates regional wall motion abnormalities. The left ventricular internal cavity size was mildly  dilated. Right Ventricle: The right ventricular size is normal. Right ventricular systolic function is normal. Left Atrium: Left atrial size was moderately dilated. No left atrial/left atrial appendage thrombus was detected. Right Atrium: Right atrial size was normal in size. Pericardium: There is no evidence of pericardial effusion. Mitral Valve: The mitral valve is normal in structure. Trivial mitral valve regurgitation. Tricuspid Valve: The tricuspid valve is normal in structure. Tricuspid valve regurgitation is trivial. Aortic Valve: The aortic valve is tricuspid. Aortic valve regurgitation is trivial. Pulmonic Valve: The pulmonic valve was normal in structure. Pulmonic valve regurgitation is not visualized. No evidence of pulmonic stenosis. Aorta: The aortic root is normal in size and structure. There is moderate (Grade III) plaque involving the descending aorta. IAS/Shunts: No atrial level shunt detected by color flow Doppler. Additional Comments: Akinesis of the distal anteroseptal wall and apex with overall severe LV dysfunction; no vegetations noted on RV lead or valves. A device lead is visualized.  AORTA Ao Root diam: 2.50 cm Ao Asc diam:  2.90 cm Redell Shallow MD Electronically signed by Redell Shallow MD Signature Date/Time: 08/12/2023/12:21:12 PM    Final    EP STUDY Result Date: 08/12/2023 See surgical note for result.  ECHOCARDIOGRAM COMPLETE Result Date: 08/08/2023    ECHOCARDIOGRAM REPORT   Patient Name:   Ebony Scott Date of Exam: 08/08/2023 Medical Rec #:  [id number]    Height:       62.0 in Accession #:    [id number]   Weight:       130.1 lb Date of Birth:  05-Jul-1950     BSA:          1.592 m Patient Age:    73 years     BP:           125/67 mmHg Patient Gender: F            HR:           55 bpm. Exam Location:  Inpatient Procedure: 2D Echo, Intracardiac  Opacification Agent, Cardiac Doppler and Color            Doppler (Both Spectral and Color Flow Doppler were utilized during            procedure). Indications:    Endocarditis  History:        Patient has prior history of Echocardiogram examinations, most                 recent 07/17/2022. CHF, CAD, Chronic Kidney Disease; Risk                 Factors:Dyslipidemia.  Sonographer:    Koleen Popper RDCS Referring Phys:  [id number] [facility]  Sonographer Comments: Suboptimal apical window. IMPRESSIONS  1. Left ventricular ejection fraction, by estimation, is 25 to 30%. The left ventricle has severely decreased function. The left ventricle demonstrates regional wall motion abnormalities (see scoring diagram/findings for description). There is moderate asymmetric left ventricular hypertrophy of the basal-septal segment. Left ventricular diastolic parameters are consistent with Grade I diastolic dysfunction (impaired relaxation).  2. Right ventricular systolic function is normal. The right ventricular size is normal. There is normal pulmonary artery systolic pressure.  3. Left atrial size was moderately dilated.  4. The mitral valve is normal in structure. No evidence of mitral valve regurgitation. No evidence of mitral stenosis.  5. The aortic valve is tricuspid. There is mild calcification of the aortic valve. Aortic valve regurgitation is trivial. No aortic stenosis is present.  6. The inferior vena cava is normal in size with greater than 50% respiratory variability, suggesting right atrial pressure of 3 mmHg. FINDINGS  Left Ventricle: Left ventricular ejection fraction, by estimation, is 25 to 30%. The left ventricle has severely decreased function. The left ventricle demonstrates regional wall motion abnormalities. The left ventricular internal cavity size was normal  in size. There is moderate asymmetric left ventricular hypertrophy of the basal-septal segment. Left ventricular diastolic parameters are consistent  with Grade I diastolic dysfunction (impaired relaxation).  LV Wall Scoring: The entire anterior septum, mid and distal inferior wall, mid inferoseptal segment, apical anterior segment, and apex are akinetic. Right Ventricle: The right ventricular size is normal. No increase in right ventricular wall thickness. Right ventricular systolic function is normal. There is normal pulmonary artery systolic pressure. The tricuspid regurgitant velocity is 2.33 m/s, and  with an assumed right atrial pressure of 3 mmHg, the estimated right ventricular systolic pressure is 24.7 mmHg. Left Atrium: Left atrial size was moderately dilated. Right Atrium: Right atrial size was normal in size. Pericardium: There is no evidence of pericardial effusion. Mitral Valve: The mitral valve is normal in structure. No evidence of mitral valve regurgitation. No evidence of mitral valve stenosis. Tricuspid Valve: The tricuspid valve is normal in structure. Tricuspid valve regurgitation is trivial. No evidence of tricuspid stenosis. Aortic Valve: The aortic valve is tricuspid. There is mild calcification of the aortic valve. Aortic valve regurgitation is trivial. Aortic regurgitation PHT measures 460 msec. No aortic stenosis is present. Aortic valve mean gradient measures 12.0 mmHg.  Aortic valve peak gradient measures 20.8 mmHg. Aortic valve area, by VTI measures 0.75 cm. Pulmonic Valve: The pulmonic valve was normal in structure. Pulmonic valve regurgitation is not visualized. No evidence of pulmonic stenosis. Aorta: The aortic root is normal in size and structure. Venous: The inferior vena cava is normal in size with greater than 50% respiratory variability, suggesting right atrial pressure of 3 mmHg. IAS/Shunts: No atrial level shunt detected by color flow Doppler. Additional Comments: A device lead is visualized.  LEFT VENTRICLE PLAX 2D LVIDd:         5.60 cm   Diastology LVIDs:         3.05 cm   LV e' medial:    4.33 cm/s LV PW:         0.70  cm   LV E/e' medial:  18.7 LV IVS:        1.10 cm   LV e' lateral:   4.28 cm/s LVOT diam:     1.50 cm   LV E/e' lateral: 18.9 LV SV:         42 LV  SV Index:   26 LVOT Area:     1.77 cm  RIGHT VENTRICLE            IVC RV S prime:     9.90 cm/s  IVC diam: 1.80 cm LEFT ATRIUM             Index LA diam:        3.30 cm 2.07 cm/m LA Vol (A2C):   47.5 ml 29.83 ml/m LA Vol (A4C):   28.0 ml 17.59 ml/m LA Biplane Vol: 39.5 ml 24.81 ml/m  AORTIC VALVE AV Area (Vmax):    0.76 cm AV Area (Vmean):   0.73 cm AV Area (VTI):     0.75 cm AV Vmax:           228.00 cm/s AV Vmean:          157.000 cm/s AV VTI:            0.557 m AV Peak Grad:      20.8 mmHg AV Mean Grad:      12.0 mmHg LVOT Vmax:         97.50 cm/s LVOT Vmean:        64.500 cm/s LVOT VTI:          0.237 m LVOT/AV VTI ratio: 0.43 AI PHT:            460 msec  AORTA Ao Root diam: 2.70 cm Ao Asc diam:  3.10 cm MITRAL VALVE               TRICUSPID VALVE MV Area (PHT): 3.12 cm    TR Peak grad:   21.7 mmHg MV Decel Time: 243 msec    TR Vmax:        233.00 cm/s MR Peak grad: 64.6 mmHg MR Vmax:      402.00 cm/s  SHUNTS MV E velocity: 81.00 cm/s  Systemic VTI:  0.24 m MV A velocity: 93.80 cm/s  Systemic Diam: 1.50 cm MV E/A ratio:  0.86 Toribio Fuel MD Electronically signed by Toribio Fuel MD Signature Date/Time: 08/08/2023/4:21:57 PM    Final    DG Chest Port 1 View Result Date: 08/06/2023 CLINICAL DATA:  Sepsis. Fatigue and weakness. Altered mental status. Fever. Productive cough. EXAM: PORTABLE CHEST 1 VIEW COMPARISON:  05/18/2023 FINDINGS: AICD noted. Wrist right shoulder arthroplasty. Postoperative findings in the cervical spine. Atherosclerotic calcification of the aortic arch. Heart size within normal limits. 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 Reticulonodular opacity laterally in the right upper lobe, possibly from atelectasis or atypical infectious bronchiolitis. Lateral calcifications in the left breast. No blunting of the  costophrenic angles. IMPRESSION: 1. Reticulonodular opacity laterally in the right upper lobe, possibly from atelectasis or atypical infectious bronchiolitis. 2. AICD. 3. 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 4. Lateral calcifications in the left breast. Electronically Signed   By: Ryan Salvage M.D.   On: 08/06/2023 17:00    Assessment/Plan # CIED associated E faecalis bacteremia - no GI or GU symptoms  - 6/17 UA negative for infection, strep pneumo, Legionella urine antigen negative - 6/19 blood cx NG in 5 days - 6/19 TTE with no vegetations  - 6/23 TEE no vegetation on RV lead or valves - 7/2 PPM extracted   Plan - will extend IV ampicillin  and ceftriaxone  for 6 weeks post PPM extraction. 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 Professional time spent includes the following activities: Preparing to see the patient (review of tests), Obtaining and reviewing separately obtained history (discharge record on 7/3, PPM extraction procedure note, Dr Maurilio note on 7/8), Performing a medically appropriate examination and evaluation , Ordering medications, communicating with SNF staff, Documenting clinical information in the EMR, Independently interpreting results (not separately reported), Communicating results to the patient, Counseling and educating the patient and Care coordination (not separately reported).   Of note, portions of this note may have been created with voice recognition software. While this note has been edited for accuracy, occasional wrong-word or 'sound-a-like'  substitutions may have occurred due to the inherent limitations of voice recognition software.   Annalee Joseph, MD [facility] 09/03/2023, 3:49 PM

## 2023-09-03 NOTE — Telephone Encounter (Signed)
 Received voicemail from patient's daughter requesting call back to discuss today's appointment.   Called [name] (DPR) back, she would like additional information regarding plan of care since she was not able to be on the phone with patient during appointment time. Call transferred to Lela Bohr, CMA to discuss as provider note is pending.   Connelly [place], BSN, RN

## 2023-09-03 NOTE — Telephone Encounter (Signed)
 Per Dr. Dea extended Iv abx to 8/13 and fax over weekly labs. [employer] and gave verbal orders to [place], [place] and faxed over orders as well waiting on confirmation.   Fax # [phone] and [phone]  Faxed orders on 7/16 and received confirmation

## 2023-09-04 DIAGNOSIS — J9811 Atelectasis: Secondary | ICD-10-CM | POA: Diagnosis not present

## 2023-09-04 DIAGNOSIS — T827XXA Infection and inflammatory reaction due to other cardiac and vascular devices, implants and grafts, initial encounter: Secondary | ICD-10-CM | POA: Insufficient documentation

## 2023-09-04 DIAGNOSIS — Z7409 Other reduced mobility: Secondary | ICD-10-CM | POA: Diagnosis not present

## 2023-09-04 DIAGNOSIS — J45909 Unspecified asthma, uncomplicated: Secondary | ICD-10-CM | POA: Diagnosis not present

## 2023-09-04 DIAGNOSIS — Z792 Long term (current) use of antibiotics: Secondary | ICD-10-CM | POA: Diagnosis not present

## 2023-09-04 DIAGNOSIS — R7881 Bacteremia: Secondary | ICD-10-CM | POA: Diagnosis not present

## 2023-09-04 DIAGNOSIS — M6281 Muscle weakness (generalized): Secondary | ICD-10-CM | POA: Diagnosis not present

## 2023-09-04 DIAGNOSIS — J9610 Chronic respiratory failure, unspecified whether with hypoxia or hypercapnia: Secondary | ICD-10-CM | POA: Diagnosis not present

## 2023-09-04 DIAGNOSIS — Z9981 Dependence on supplemental oxygen: Secondary | ICD-10-CM | POA: Diagnosis not present

## 2023-09-05 ENCOUNTER — Ambulatory Visit: Payer: Self-pay | Admitting: Allergy and Immunology

## 2023-09-05 ENCOUNTER — Ambulatory Visit: Attending: Cardiology

## 2023-09-05 ENCOUNTER — Ambulatory Visit

## 2023-09-05 DIAGNOSIS — Z7409 Other reduced mobility: Secondary | ICD-10-CM | POA: Diagnosis not present

## 2023-09-05 DIAGNOSIS — Z9981 Dependence on supplemental oxygen: Secondary | ICD-10-CM | POA: Diagnosis not present

## 2023-09-05 DIAGNOSIS — R7881 Bacteremia: Secondary | ICD-10-CM | POA: Diagnosis not present

## 2023-09-05 DIAGNOSIS — M6281 Muscle weakness (generalized): Secondary | ICD-10-CM | POA: Diagnosis not present

## 2023-09-05 DIAGNOSIS — I5020 Unspecified systolic (congestive) heart failure: Secondary | ICD-10-CM

## 2023-09-05 DIAGNOSIS — J9811 Atelectasis: Secondary | ICD-10-CM | POA: Diagnosis not present

## 2023-09-05 DIAGNOSIS — Z9181 History of falling: Secondary | ICD-10-CM | POA: Diagnosis not present

## 2023-09-05 DIAGNOSIS — R059 Cough, unspecified: Secondary | ICD-10-CM | POA: Diagnosis not present

## 2023-09-05 DIAGNOSIS — J9610 Chronic respiratory failure, unspecified whether with hypoxia or hypercapnia: Secondary | ICD-10-CM | POA: Diagnosis not present

## 2023-09-05 DIAGNOSIS — I5022 Chronic systolic (congestive) heart failure: Secondary | ICD-10-CM

## 2023-09-05 DIAGNOSIS — Z792 Long term (current) use of antibiotics: Secondary | ICD-10-CM | POA: Diagnosis not present

## 2023-09-05 NOTE — Progress Notes (Signed)
 Device clinic follow up for wound check- s/p ICD system extraction on 08/21/23. Wound well healed. No signs/ symptoms of infection at surgical site. Patient currently on antibiotics.

## 2023-09-05 NOTE — Patient Instructions (Signed)
   After Your Pacemaker   Monitor your pacemaker site for redness, swelling, and drainage. Call the device clinic at [phone] if you experience these symptoms or fever/chills.  Your incision was closed with Steri-strips or staples:  You may shower 7 days after your procedure and wash your incision with soap and water. Avoid lotions, ointments, or perfumes over your incision until it is well-healed.  You may use a hot tub or a pool after your wound check appointment if the incision is completely closed.

## 2023-09-09 ENCOUNTER — Telehealth: Payer: Self-pay | Admitting: *Deleted

## 2023-09-09 DIAGNOSIS — Z792 Long term (current) use of antibiotics: Secondary | ICD-10-CM | POA: Diagnosis not present

## 2023-09-09 DIAGNOSIS — Z9181 History of falling: Secondary | ICD-10-CM | POA: Diagnosis not present

## 2023-09-09 DIAGNOSIS — Z7409 Other reduced mobility: Secondary | ICD-10-CM | POA: Diagnosis not present

## 2023-09-09 DIAGNOSIS — M6281 Muscle weakness (generalized): Secondary | ICD-10-CM | POA: Diagnosis not present

## 2023-09-09 DIAGNOSIS — R7881 Bacteremia: Secondary | ICD-10-CM | POA: Diagnosis not present

## 2023-09-09 NOTE — Telephone Encounter (Signed)
-----   Message from [name] sent at 08/28/2023  6:47 AM EDT ----- Please arrange for Ebony Scott's hyperimmune IVIG.

## 2023-09-09 NOTE — Telephone Encounter (Signed)
 L/m for patient regarding change from SCIG to IVIG Asceniv. Also called and advised daughter Charlies also. Orders sent to [facility] for same

## 2023-09-10 ENCOUNTER — Ambulatory Visit (INDEPENDENT_AMBULATORY_CARE_PROVIDER_SITE_OTHER)

## 2023-09-10 DIAGNOSIS — Z888 Allergy status to other drugs, medicaments and biological substances status: Secondary | ICD-10-CM

## 2023-09-10 DIAGNOSIS — Z7952 Long term (current) use of systemic steroids: Secondary | ICD-10-CM

## 2023-09-10 DIAGNOSIS — D84821 Immunodeficiency due to drugs: Secondary | ICD-10-CM

## 2023-09-10 NOTE — Telephone Encounter (Signed)
 Spoke to daughter and advised change to IVIG she has couple SCIG doses to carry her until she gets out of rehab since nursing will not be able to start until she is out of rehab

## 2023-09-10 NOTE — Progress Notes (Signed)
 Patient daughter advised of approval and orders for IVIG Asceniv, she will need to get out of rehab to get infusions at home due to [facility]

## 2023-09-10 NOTE — Progress Notes (Signed)
 Immunotherapy   Patient Details  Name: Ebony Scott MRN: [id number] Date of Birth: [birth year]  09/10/2023  [name] here to use a room for her home health nurse to administer the patients Hizentra  infusion. Patient was in clinic for 2 hours with no problems. Patient was picked up and transported back to the rehab facility.    [name] Shed 09/10/2023, 9:24 AM

## 2023-09-11 DIAGNOSIS — R262 Difficulty in walking, not elsewhere classified: Secondary | ICD-10-CM | POA: Diagnosis not present

## 2023-09-11 DIAGNOSIS — E119 Type 2 diabetes mellitus without complications: Secondary | ICD-10-CM | POA: Diagnosis not present

## 2023-09-11 DIAGNOSIS — R7881 Bacteremia: Secondary | ICD-10-CM | POA: Diagnosis not present

## 2023-09-11 DIAGNOSIS — J189 Pneumonia, unspecified organism: Secondary | ICD-10-CM | POA: Diagnosis not present

## 2023-09-12 DIAGNOSIS — B379 Candidiasis, unspecified: Secondary | ICD-10-CM | POA: Diagnosis not present

## 2023-09-12 DIAGNOSIS — M6281 Muscle weakness (generalized): Secondary | ICD-10-CM | POA: Diagnosis not present

## 2023-09-12 DIAGNOSIS — Z7409 Other reduced mobility: Secondary | ICD-10-CM | POA: Diagnosis not present

## 2023-09-12 DIAGNOSIS — Z792 Long term (current) use of antibiotics: Secondary | ICD-10-CM | POA: Diagnosis not present

## 2023-09-12 DIAGNOSIS — R7881 Bacteremia: Secondary | ICD-10-CM | POA: Diagnosis not present

## 2023-09-13 ENCOUNTER — Encounter: Payer: Self-pay | Admitting: Internal Medicine

## 2023-09-13 ENCOUNTER — Ambulatory Visit: Attending: Internal Medicine | Admitting: Internal Medicine

## 2023-09-13 VITALS — BP 93/55 | HR 80 | Resp 14 | Ht 60.0 in

## 2023-09-13 DIAGNOSIS — M0600 Rheumatoid arthritis without rheumatoid factor, unspecified site: Secondary | ICD-10-CM

## 2023-09-13 DIAGNOSIS — R21 Rash and other nonspecific skin eruption: Secondary | ICD-10-CM | POA: Diagnosis not present

## 2023-09-13 DIAGNOSIS — Z7952 Long term (current) use of systemic steroids: Secondary | ICD-10-CM | POA: Diagnosis not present

## 2023-09-13 DIAGNOSIS — R197 Diarrhea, unspecified: Secondary | ICD-10-CM

## 2023-09-14 DIAGNOSIS — R7881 Bacteremia: Secondary | ICD-10-CM | POA: Diagnosis not present

## 2023-09-14 DIAGNOSIS — B379 Candidiasis, unspecified: Secondary | ICD-10-CM | POA: Diagnosis not present

## 2023-09-14 DIAGNOSIS — Z792 Long term (current) use of antibiotics: Secondary | ICD-10-CM | POA: Diagnosis not present

## 2023-09-14 DIAGNOSIS — J471 Bronchiectasis with (acute) exacerbation: Secondary | ICD-10-CM | POA: Diagnosis not present

## 2023-09-14 DIAGNOSIS — G4736 Sleep related hypoventilation in conditions classified elsewhere: Secondary | ICD-10-CM | POA: Diagnosis not present

## 2023-09-14 DIAGNOSIS — I5032 Chronic diastolic (congestive) heart failure: Secondary | ICD-10-CM | POA: Diagnosis not present

## 2023-09-14 DIAGNOSIS — Z7409 Other reduced mobility: Secondary | ICD-10-CM | POA: Diagnosis not present

## 2023-09-14 DIAGNOSIS — G894 Chronic pain syndrome: Secondary | ICD-10-CM | POA: Diagnosis not present

## 2023-09-14 DIAGNOSIS — M6281 Muscle weakness (generalized): Secondary | ICD-10-CM | POA: Diagnosis not present

## 2023-09-14 DIAGNOSIS — Z79899 Other long term (current) drug therapy: Secondary | ICD-10-CM | POA: Diagnosis not present

## 2023-09-14 DIAGNOSIS — J4540 Moderate persistent asthma, uncomplicated: Secondary | ICD-10-CM | POA: Diagnosis not present

## 2023-09-14 DIAGNOSIS — J45909 Unspecified asthma, uncomplicated: Secondary | ICD-10-CM | POA: Diagnosis not present

## 2023-09-14 DIAGNOSIS — G4733 Obstructive sleep apnea (adult) (pediatric): Secondary | ICD-10-CM | POA: Diagnosis not present

## 2023-09-14 DIAGNOSIS — R0602 Shortness of breath: Secondary | ICD-10-CM | POA: Diagnosis not present

## 2023-09-14 DIAGNOSIS — M0600 Rheumatoid arthritis without rheumatoid factor, unspecified site: Secondary | ICD-10-CM | POA: Diagnosis not present

## 2023-09-17 ENCOUNTER — Ambulatory Visit

## 2023-09-17 DIAGNOSIS — Z9181 History of falling: Secondary | ICD-10-CM | POA: Diagnosis not present

## 2023-09-17 DIAGNOSIS — Z7409 Other reduced mobility: Secondary | ICD-10-CM | POA: Diagnosis not present

## 2023-09-17 DIAGNOSIS — R7881 Bacteremia: Secondary | ICD-10-CM | POA: Diagnosis not present

## 2023-09-17 DIAGNOSIS — M6281 Muscle weakness (generalized): Secondary | ICD-10-CM | POA: Diagnosis not present

## 2023-09-17 DIAGNOSIS — Z792 Long term (current) use of antibiotics: Secondary | ICD-10-CM | POA: Diagnosis not present

## 2023-09-18 ENCOUNTER — Ambulatory Visit (INDEPENDENT_AMBULATORY_CARE_PROVIDER_SITE_OTHER): Admitting: Pulmonary Disease

## 2023-09-18 ENCOUNTER — Encounter: Payer: Self-pay | Admitting: Pulmonary Disease

## 2023-09-18 VITALS — BP 100/38 | HR 79 | Ht 60.0 in | Wt 121.0 lb

## 2023-09-18 DIAGNOSIS — J4551 Severe persistent asthma with (acute) exacerbation: Secondary | ICD-10-CM | POA: Diagnosis not present

## 2023-09-18 DIAGNOSIS — J479 Bronchiectasis, uncomplicated: Secondary | ICD-10-CM

## 2023-09-18 MED ORDER — TRELEGY ELLIPTA 200-62.5-25 MCG/ACT IN AEPB: 1.0000 | INHALATION_SPRAY | Freq: Every morning | RESPIRATORY_TRACT | 3 refills | Status: AC

## 2023-09-18 NOTE — Patient Instructions (Addendum)
 Continue trelegy ellipta  1 puff daily - rinse mouth out after each use  Continue CPAP at night when able  Follow up in 6 months

## 2023-09-18 NOTE — Progress Notes (Unsigned)
 Synopsis: Referred in 2011 for asthma by [name], MD.  Previously a patient of Drs. Brien Standing, McQuaid and Green Level.  Subjective:   PATIENT ID: Ebony Scott GENDER: female DOB: June 01, 1950, MRN: [id number]  HPI  No chief complaint on file.  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   PFTs show mild restrictive defect and mild diffusion defect.   She is having increased cough and wheezing of late. She had a 24 hours GI bug yesterday with diarrhea and fatigue.   OV 03/19/22 She was treated with azithromycin  and prednisone  11/24/21 for bronchitis.   She has increased cough and mucous production over recent weeks. No fevers or chills. She has left sided nose/sinus drainage with pressure under the left eye.   OV 11/15/21 She reports increased cough and wheezing over recent weeks. She was placed on extended steroid taper with some improvement but she reports it has progressed since completing the taper. She denies fevers chills or change in mucous production. She continues to produce yellow/clear sputum.   She received her RSV and influenza vaccines.  OV 03/15/21 She has been doing okay since last visit.  She reports persistent wheezing and cough at night.  She is coughing up thick yellow sputum.  She is currently taking 12.5 mg of prednisone  daily for her rheumatoid arthritis and is being tapered down by Dr. Sherre of rheumatology.  She is receiving IVIG infusions via Dr. Kozlow.  She reports she is having left ear and sinus congestion with some postnasal drainage.  He has been doing okay on Stiolto inhaler.  OV 11/15/20 She reports increased wheezing, cough and dyspnea since last visit. She is having a lot of wheezing at night. She remains on stiolto respimat  daily and is using  albuterol  nebulizer treatments twice daily with flutter valve for mucous clearance. She complains of cough when eating/drinking.  She is being started on IVIG infusions by Dr. 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 She remains on prednisone  12mg  daily for her rheumatoid arthritis.  We discussed her CT chest results from 08/23/20 which showed resolution of her pulmonary nodules with residual areas of scarring.  OV 06/14/20 She has completed two courses of diflucan  per ENT for laryngeal candidiasis and her voice is much improved. We discontinued her inhaled steroid during this period of time as well. She is noticing more shortness of breath during the day.   She was recently transitioned to rituximab  by her rheumatoligist and has been taken off liflunamide. She continues on 15mg  of prednisone  daily.   CT Chest on 4/6 when compared to the CT Chest on 2/4 shows a decrease in the size of the nodular/consolidative opacities of the medial right lung base and left lower lobe. No new nodules or lesions noted.   OV 04/29/20 She underwent bronchoscopy on 04/05/20 and cultures remain unrevealing at this time. The BAL samples have grown candida albicans and we discussed that we typically do not treat this finding as it does not usually cause pathogenic disease.   She continues to experience hoarse voice and cough along with shortness of breath. She denies weight loss, night sweats, loss of appetite.   OV 04/01/20: CT scan 03/25/2020 showed multifocal areas of nodularity and consolidative changes. Findings consistent with atypical infection or possible malignancy. She was started on azithromycin  and  cefpodoxime  by Dr. Duanne. She has completed the azithromycin  and she started the cefpodoxime  on 2/7. She was treated with levaquin  on 03/21/20 for 7 days initially until the CT scan resulted. Blood cultures were negative on 02/23/20 when she presented with AMS.   She reports she is coughing up brown  sputum. At baseline she doe snot cough up sputum or it is clear. The sputum amount has declined she taking antibiotics. She denies fevers, chills or sweats. Her appetite is well.    OV 09/01/19: Ebony Scott is a 73 y/o woman who presents for follow up of bronchiectasis, tracheobronchomalacia, immunosuppression, chronic AC for recurrent VTE. She started prescribed levofloxacin  last evening for Pseudomonas bronchiectasis AE. So far she has not had nausea associated with taking this med, but took zofran  prophylacticaly. She remains SOB with no change so far in her symptoms. She still coughs frequently despite using her inhaler and hypertonic saline nebs + flutter as prescribed. She has follow up with her PCP soon for hyperglycemiaa. She has follow up with her rheumatologist in August and is hoping that her prednisone  will be able to be decreased soon.  Past Medical History:  Diagnosis Date   Anemia    Angio-edema    Breast cancer (HCC) 1998   Left breast, in remission   Bronchiectasis (HCC)    CHF (congestive heart failure) (HCC)    Closed displaced fracture of metatarsal bone of right foot 01/20/2019   Closed fracture of fifth metatarsal bone of right foot, initial encounter 01/15/2018   Clostridium difficile colitis 01/14/2018   Coronary artery disease    Depression    some; don't take anything for it (02/20/2012), pt denies dx as of 06/02/21   Diverticulosis    DVT (deep venous thrombosis) (HCC)    Fibromyalgia 11/2011   Fracture of base of fifth metatarsal bone with routine healing, left    GAVE (gastric antral vascular ectasia)    GERD (gastroesophageal reflux disease)    Headache(784.0)    related to allergies; more at different times during the year (02/20/2012)   Hemorrhoids    Hiatal hernia    back and neck   History of blood transfusion 2023   1 unit, 5 units of Iron    Hx of adenomatous colonic polyps 04/12/2016   Hypercholesteremia    good cholesterol is high   Hypothyroidism     h/o Graves disease   IBS (irritable bowel syndrome)    Moderate persistent asthma    -FeV1 72% 2011, -IgE 102 2011, CT sinus Neg 2011   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 Cindy Setter   Sleep apnea    uses cpap nightly   Tracheobronchomalacia    Vasculitis (HCC) 12/13/2017     Family History  Problem Relation Age of Onset   Allergies Mother    Heart disease Mother    Arthritis Mother    Lung cancer Mother        heavy smoker   Diabetes Mother    Allergies Father    Heart disease Father    Arthritis Father    Stroke Father    Heart disease Brother    Diabetes Maternal Grandfather    Colitis Daughter    Colon cancer Other  Maternal half uncle     Social History   Socioeconomic History   Marital status: Divorced    Spouse name: Not on file   Number of children: 2   Years of education: college   Highest education level: Bachelor's degree (e.g., BA, AB, BS)  Occupational History   Occupation: Disabled    Comment: Retired Dispensing optician: RETIRED  Tobacco Use   Smoking status: Never    Passive exposure: Past   Smokeless tobacco: Never   Tobacco comments:    Parents  Vaping Use   Vaping status: Never Used  Substance and Sexual Activity   Alcohol  use: Not Currently    Alcohol /week: 0.0 standard drinks of alcohol    Drug use: No   Sexual activity: Not Currently    Birth control/protection: Surgical    Comment: Hysterectomy  Other Topics Concern   Not on file  Social History Narrative   Patient lives at home alone. Patient  divorced.    Patient has her BS degree.   Right handed.   Caffeine- sometimes coffee.      [facility]:   Born in [place], [place]. She worked as a Armed forces operational officer. She has no pets  currently. She does have indoor plants. Previously had mold in her home that was remediated. Carpet was removed.          Social Drivers of Corporate investment banker Strain: Low Risk  (03/26/2023)   Overall Financial Resource Strain (CARDIA)    Difficulty of Paying Living Expenses: Not very hard  Food Insecurity: No Food Insecurity (08/07/2023)   Hunger Vital Sign    Worried About Running Out of Food in the Last Year: Never true    Ran Out of Food in the Last Year: Never true  Transportation Needs: No Transportation Needs (08/07/2023)   PRAPARE - Administrator, Civil Service (Medical): No    Lack of Transportation (Non-Medical): No  Physical Activity: Inactive (03/26/2023)   Exercise Vital Sign    Days of Exercise per Week: 0 days    Minutes of Exercise per Session: 30 min  Stress: Stress Concern Present (03/26/2023)   [employer] of Occupational Health - Occupational Stress Questionnaire    Feeling of Stress : To some extent  Social Connections: Socially Isolated (08/07/2023)   Social Connection and Isolation Panel    Frequency of Communication with Friends and Family: Never    Frequency of Social Gatherings with Friends and Family: Never    Attends Religious Services: Never    Database administrator or Organizations: No    Attends Banker Meetings: Never    Marital Status: Divorced  Catering manager Violence: Not At Risk (08/07/2023)   Humiliation, Afraid, Rape, and Kick questionnaire    Fear of Current or Ex-Partner: No    Emotionally Abused: No    Physically Abused: No    Sexually Abused: No     Allergies  Allergen Reactions   Baclofen  Anaphylaxis and Other (See Comments)    Altered mental status requiring a 3-day hospital stay- patient became unresponsive   Put me in a coma for five days, Altered mental status requiring a 3-day hospital stay- patient became unresponsive   Other Shortness Of Breath and Other (See Comments)    Grass and  weeds sneezing; filled sinuses (02/20/2012)   Pork-Derived Products Anaphylaxis   Shellfish Allergy  Anaphylaxis, Shortness Of Breath, Itching and Rash   Shrimp Extract Anaphylaxis and  Other (See Comments)    ALL SHELLFISH   Tetracycline Hcl Nausea And Vomiting   Xolair  [Omalizumab ] Other (See Comments)    Caused Blood clot   Zoledronic  Acid Other (See Comments)    Reclast - Fever, Put in hospital, dr said it was a reaction from a reaction    Fever, inability to walk, Put in hospital  Fever, Put in hospital, dr said it was a reaction from a reaction , Other reaction(s): Unknown   Celecoxib Swelling    Feet swelling   Dilaudid  [Hydromorphone  Hcl] Itching   Hydrocodone -Acetaminophen  Itching   Levofloxacin  Other (See Comments)    GI upset   Molds & Smuts Cough   Morphine  And Codeine Hives and Itching   Oxycodone  Hcl Nausea And Vomiting    GI Intolerance   Paroxetine Nausea And Vomiting and Other (See Comments)    GI Intolerance also   Tetracycline Hcl Other (See Comments)    GI Intolerance   Oxycodone -Acetaminophen  Itching   Diltiazem  Hcl Swelling   Dust Mite Extract Other (See Comments) and Cough    sneezing (02/20/2012) too   Gamunex [Immune Globulin ] Itching and Rash    Tolerates hizentra     Rituximab  Other (See Comments)    Anemia    Rofecoxib Swelling    Vioxx- feet swelling   Tree Extract Itching    tested and told I was allergic to it; never experienced a reaction to it (02/20/2012)     Outpatient Medications Prior to Visit  Medication Sig Dispense Refill   acetaminophen  (TYLENOL ) 500 MG tablet Take 1 tablet (500 mg total) by mouth every 6 (six) hours as needed for moderate pain (pain score 4-6) or mild pain (pain score 1-3).     albuterol  (PROVENTIL ) (2.5 MG/3ML) 0.083% nebulizer solution Take 3 mLs (2.5 mg total) by nebulization every 4 (four) hours as needed for wheezing or shortness of breath. (Patient not taking: Reported on 09/13/2023) 150 mL 1   albuterol   (VENTOLIN  HFA) 108 (90 Base) MCG/ACT inhaler Inhale 2 puffs into the lungs every 6 (six) hours as needed for wheezing or shortness of breath. 18 g 1   allopurinol  (ZYLOPRIM ) 100 MG tablet Take 1 tablet (100 mg total) by mouth daily. 90 tablet 0   Alpha-D-Galactosidase (BEANO PO) Take 2 tablets by mouth in the morning, at noon, and at bedtime.     Alpha-Lipoic Acid 600 MG TABS Take 600 mg by mouth in the morning.     ampicillin  IVPB Inject 2 g into the vein every 8 (eight) hours for 28 days. Indication:  E faecalis bacteremia/ICD infection First Dose: Yes Last Day of Therapy:  09/19/23 Labs - Once weekly:  CBC/D and BMP, Labs - Once weekly: ESR and CRP (Patient not taking: Reported on 09/13/2023) 84 Units 0   atorvastatin  (LIPITOR) 80 MG tablet Take 1 tablet (80 mg total) by mouth every evening. 90 tablet 1   Azelastine  HCl 137 MCG/SPRAY SOLN 2 sprays once per day (Patient not taking: Reported on 09/13/2023) 30 mL 2   Calcium -Magnesium -Vitamin D  (CITRACAL CALCIUM +D) 600-40-500 MG-MG-UNIT TB24 Take 1 tablet by mouth daily.     cefTRIAXone  (ROCEPHIN ) IVPB Inject 2 g into the vein every 12 (twelve) hours for 28 days. Indication:  E faecalis bacteremia/ICD infection First Dose: Yes Last Day of Therapy:  09/19/23 Labs - Once weekly:  CBC/D and BMP, Labs - Once weekly: ESR and CRP (Patient not taking: Reported on 09/13/2023) 56 Units 0   cetirizine  (ZYRTEC ) 10 MG tablet  Take 1 tablet (10 mg total) by mouth daily as needed for allergies (Can take a n extra dose during flare ups.). 180 tablet 1   Cholecalciferol  (VITAMIN D3) 25 MCG (1000 UT) capsule Take 1,000 Units by mouth at bedtime.     denosumab  (PROLIA ) 60 MG/ML SOSY injection Inject 60 mg into the skin every 6 (six) months. (Patient not taking: Reported on 09/13/2023)     dexlansoprazole  (DEXILANT ) 60 MG capsule Take 1 capsule (60 mg total) by mouth in the morning. 90 capsule 1   diclofenac  Sodium (VOLTAREN ) 1 % GEL Apply 1 Application topically as  needed (pain). (Patient not taking: Reported on 09/13/2023)     empagliflozin  (JARDIANCE ) 10 MG TABS tablet Take 1 tablet (10 mg total) by mouth daily. 90 tablet 1   ENTRESTO  24-26 MG TAKE 1 TABLET BY MOUTH TWICE A DAY 180 tablet 0   EPINEPHRINE  0.3 mg/0.3 mL IJ SOAJ injection USE AS DIRECTED BY YOUR PHYSICIAN INTRAMUSCULARLY AS NEEDED FOR ANAPHYLAXIS 2 each 1   famotidine  (PEPCID ) 20 MG tablet TAKE 1 TABLET (20 MG) BY MOUTH DAILY BEFORE LUNCH AND1 TABLET BY MOUTH EACH NIGHT AT BEDTIME 180 tablet 1   fluticasone  (FLONASE ) 50 MCG/ACT nasal spray Place 2 sprays into both nostrils daily. (Patient not taking: Reported on 09/13/2023) 16 g 6   Fluticasone -Umeclidin-Vilant (TRELEGY ELLIPTA ) 200-62.5-25 MCG/ACT AEPB INHALE 1 INHALATION INTO THE LUNGS IN THE MORNING. 120 each 1   folic acid  (FOLVITE ) 1 MG tablet Take 1 tablet (1 mg total) by mouth daily. 90 tablet 3   furosemide  (LASIX ) 40 MG tablet Take 0.5 tablets (20 mg total) by mouth daily as needed for fluid or edema (Weight gain, edema >3lbs). Monday and Thursday     gabapentin  (NEURONTIN ) 300 MG capsule Take 1 capsule (300 mg total) by mouth at bedtime.     guaiFENesin  (MUCINEX ) 600 MG 12 hr tablet Take 600 mg by mouth 2 (two) times daily.     HIZENTRA  10 GM/50ML SOLN Inject 15 g into the skin once a week. 13 g Infusion 300 mL 11   ipratropium-albuterol  (DUONEB) 0.5-2.5 (3) MG/3ML SOLN Take 3 mLs by nebulization every 6 (six) hours as needed. (Patient not taking: Reported on 09/13/2023) 360 mL 6   isosorbide -hydrALAZINE  (BIDIL ) 20-37.5 MG tablet TAKE 1 TABLET BY MOUTH TWICE A DAY 180 tablet 0   Lactase (LACTOSE FAST ACTING RELIEF PO) Take 2 tablets by mouth 3 (three) times daily before meals.     montelukast  (SINGULAIR ) 10 MG tablet Take 1 tablet (10 mg total) by mouth daily after supper. 90 tablet 1   mupirocin  ointment (BACTROBAN ) 2 % Apply 1 Application topically as needed (wounds). (Patient not taking: Reported on 09/13/2023) 30 g 3   nystatin   (MYCOSTATIN ) 100000 UNIT/ML suspension Swish, gargle, and swallow 5 ml after Trelegy use (Patient not taking: Reported on 09/13/2023) 473 mL 0   predniSONE  (DELTASONE ) 5 MG tablet Take 1 tablet (5 mg total) by mouth daily with breakfast. 90 tablet 0   Prenatal Vit-Fe Fumarate-FA (PRENATAL VITAMINS PO) Take 1 tablet by mouth in the morning.     Probiotic Product (ALIGN) 4 MG CAPS Take 4 mg by mouth every evening.     Respiratory Therapy Supplies (NEBULIZER MASK ADULT) MISC 1 Device by Does not apply route as directed. (Patient not taking: Reported on 09/13/2023) 1 each 1   rivaroxaban  (XARELTO ) 20 MG TABS tablet Take 1 tablet (20 mg total) by mouth daily with supper.  sodium bicarbonate  650 MG tablet Take 650 mg by mouth daily.     sodium chloride  HYPERTONIC 3 % nebulizer solution Take 4 mLs by nebulization at bedtime. (Patient not taking: Reported on 09/13/2023)     traZODone  (DESYREL ) 50 MG tablet Take 100 mg by mouth at bedtime as needed.     No facility-administered medications prior to visit.   Review of Systems  Constitutional:  Negative for chills, fever, malaise/fatigue and weight loss.  HENT:  Negative for congestion, ear pain, sinus pain and sore throat.   Eyes: Negative.   Respiratory:  Positive for cough, sputum production, shortness of breath and wheezing. Negative for hemoptysis.   Cardiovascular:  Negative for chest pain, palpitations, orthopnea, claudication and leg swelling.  Gastrointestinal:  Negative for abdominal pain, heartburn, nausea and vomiting.  Genitourinary: Negative.   Musculoskeletal:  Negative for joint pain and myalgias.  Skin:  Negative for rash.  Neurological:  Negative for weakness.  Endo/Heme/Allergies: Negative.   Psychiatric/Behavioral: Negative.      Objective:   There were no vitals filed for this visit.  Physical Exam Constitutional:      General: She is not in acute distress.    Appearance: She is not ill-appearing.  HENT:     Head:  Normocephalic and atraumatic.  Eyes:     General: No scleral icterus.    Conjunctiva/sclera: Conjunctivae normal.  Cardiovascular:     Rate and Rhythm: Normal rate and regular rhythm.     Pulses: Normal pulses.     Heart sounds: Normal heart sounds. No murmur heard. Pulmonary:     Effort: Pulmonary effort is normal.     Breath sounds: Wheezing and rhonchi present. No rales.  Musculoskeletal:     Right lower leg: No edema.     Left lower leg: No edema.  Skin:    General: Skin is warm and dry.  Neurological:     General: No focal deficit present.     Mental Status: She is alert.    CBC    Component Value Date/Time   WBC 8.7 08/21/2023 2020   RBC 2.73 (L) 08/21/2023 2020   HGB 8.6 (L) 08/21/2023 2020   HGB 9.9 (L) 08/20/2022 1044   HCT 27.8 (L) 08/21/2023 2020   HCT 30.2 (L) 08/20/2022 1044   PLT 166 08/21/2023 2020   PLT 207 08/20/2022 1044   MCV 101.8 (H) 08/21/2023 2020   MCV 105 (H) 08/20/2022 1044   MCH 31.5 08/21/2023 2020   MCHC 30.9 08/21/2023 2020   RDW 15.4 08/21/2023 2020   RDW 12.9 08/20/2022 1044   LYMPHSABS 1.9 08/12/2023 0657   LYMPHSABS 0.7 07/24/2022 1218   MONOABS 1.2 (H) 08/12/2023 0657   EOSABS 0.3 08/12/2023 0657   EOSABS 0.1 07/24/2022 1218   BASOSABS 0.0 08/12/2023 0657   BASOSABS 0.1 07/24/2022 1218      Latest Ref Rng & Units 08/22/2023    5:00 AM 08/21/2023    5:00 AM 08/19/2023    4:35 AM  BMP  Glucose 70 - 99 mg/dL 85  88  83   BUN 8 - 23 mg/dL 22  20  18    Creatinine 0.44 - 1.00 mg/dL 8.38  8.54  8.58   Sodium 135 - 145 mmol/L 138  138  138   Potassium 3.5 - 5.1 mmol/L 4.0  4.1  4.1   Chloride 98 - 111 mmol/L 108  105  104   CO2 22 - 32 mmol/L 21  23  22  Calcium  8.9 - 10.3 mg/dL 8.3  8.5  8.5    Chest imaging: Chest Imaging- films reviewed: HRCT Chest 04/16/22 1. Bland appearing scarring of the bilateral lung bases with mild associated tubular bronchiectasis, similar to prior examination. No findings of fibrotic interstitial lung  disease. 2. Fine, clustered centrilobular and tree-in-bud nodularity of the posterior right upper lobe, unchanged and consistent with atypical infection, including atypical mycobacterium. No evidence of ongoing infection. 3. Minimal subpleural radiation fibrosis of the anterior left lung. 4. Postoperative findings of left lumpectomy. 5. Coronary artery disease. 6. Aortic valve calcifications. Correlate for echocardiographic evidence of aortic valve dysfunction  CT Chest wo contrast 08/23/20 - new trace bilateral pleural effusions and septal thickening. Decrease and resolution of pulmonary nodules.   CT Chest wo contrast 05/25/20 - near resolution of the RUL 7mm nodule. Nodular opacity of medial RUL is now 6mm from 13mm. The opacity of the medial RLL is now 1.8x1.0cm from 2.0x1.1 and the LLL opacity is 1.9x1.6cm from 3.7x3.7cm.  CT chest wo contrast 03/25/2020 - multifocal areas of nodularity and consolidative changes. Findings consistent with atypical infection or possible malignancy.  CT high-resolution 10/24/2018-mild bronchiectasis right middle lobe, lingula, right lower lobe, but does taper more appropriately distally.  Persistent nodules in right middle lobe and lingula with mild tree-in-bud opacities.  No inter or intralobular septal thickening or significant groundglass.  Expiratory images with some mosaicism and significant bronchial and tracheal collapse, especially on the right.    HRCT chest 08/30/19: RUL TIB opacities, multilobar bronchiectasis. Tracheobronchomalacia progressed since previous CT scan. Subacute rib fractures- healing. CAD.  PFT:    Latest Ref Rng & Units 06/28/2022    3:37 PM 08/20/2019    9:55 AM 09/18/2018   10:00 AM 06/19/2017    9:48 AM 08/08/2015   10:35 AM 04/29/2015    9:55 AM  PFT Results  FVC-Pre L 1.62  1.72  1.74  P 2.00  2.08  2.24   FVC-Predicted Pre % 65  66  66  P 74  75  81   FVC-Post L 1.84  1.65  1.61  P 1.86  2.17  2.25   FVC-Predicted Post % 73  63  61   P 69  78  81   Pre FEV1/FVC % % 90  90  88  P 90  83  87   Post FEV1/FCV % % 89  90  91  P 93  87  84   FEV1-Pre L 1.46  1.55  1.53  P 1.80  1.73  1.95   FEV1-Predicted Pre % 78  79  76  P 88  82  92   FEV1-Post L 1.64  1.49  1.47  P 1.72  1.89  1.89   DLCO uncorrected ml/min/mmHg 10.70  11.33  13.51  P 13.68  12.36  12.99   DLCO UNC% % 61  64  77  P 66  60  63   DLCO corrected ml/min/mmHg 12.14  11.33    13.76  12.87   DLCO COR %Predicted % 70  64    66  62   DLVA Predicted % 95  83  92  P 85  94  81   TLC L 3.24  3.71  3.52  P 3.95   3.91   TLC % Predicted % 70  80  76  P 85   84   RV % Predicted % 72  76  79  P 86   83  P Preliminary result   2020-no significant obstruction, mild restriction, mild DLCO reduction.  Flow volume loops consistent with restriction.   2021-no obstruction or bronchodilator reversibility.  No significant restriction.  Mild diffusion impairment.  Compared to 2020, mild improvement.   Previous respiratory cultures- all fungal and AFB negative from right middle lobe and lingula during bronchoscopy 08/2016, only positive for normal flora  BAL 04/05/20 - Candida Albicans    Pathology 08/23/2016: BAL lingula-no AFB or fungus   Echocardiogram: Unable to review report from 02/2018.  08/05/2017 demonstrates LVEF 65 to 70%, grade 1 diastolic dysfunction.  Normal LA, RV, RA.  Trivial TR and MR, otherwise normal valves.   Echocardiogram 07/10/2019-LVEF greater than 75% with hyperdynamic function, moderate LVH with grade 1 diastolic dysfunction.  Normal LA, RV, RA.  Trivial AR, otherwise normal valves.  Allergy  panel 04/02/2016-increased IgE for Aspergillus, otherwise negative Alpha-1 antitrypsin 170   Bronchoscopy 12/24/2018 (performed at [facility]) Neutrophils 12% Lymphocytes 1% Macrophages 79% Respiratory viral panel negative Unable to see PJP DNA Respiratory culture-1+ mixed gram-negative rods, normal flora Fungus culture negative AFB negative   08/25/19  sputum- Pan-sensitive Pseudomonas   Barium Swallow 02/15/2015  1. Small type 1 transient hiatal hernia. 2. Incomplete epiglottic inversion during swallowing, with laryngeal penetration of thick liquid to the level of the vocal cords. 3. No stricture or dysmotility identified. 4. 4 mm anterolisthesis at C4-5.  Assessment & Plan:   Severe persistent asthma with acute exacerbation  Bronchiectasis without complication (HCC)  Discussion: Ebony Scott is a 73 year old woman, never smoker with asthma, tracheobronchomalacia on CPAP with oxygen , bronchiectasis and rheumatoid arthritis on prednisone  (previously on leflunamide and rituximab ) who returns to pulmonary clinic for follow up.   She appears to have increase in her asthma symptoms with possible exacerbation. She is to start prolonged steroid taper along with airway clearance regimen of nebulizer treatments, flutter valve and chest vest.    She is to continue budesonide  nebs twice daily along with stiolto inhaler and as needed albuterol .   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  She also has bronchiectasis and tracheobronchomalacia.  Follow up in 4 months  Dorn Chill, MD [facility] Office: [phone]   Current Outpatient Medications:    acetaminophen  (TYLENOL ) 500 MG tablet, Take 1 tablet (500 mg total) by mouth every 6 (six) hours as needed for moderate pain (pain score 4-6) or mild pain (pain score 1-3)., Disp: , Rfl:    albuterol  (PROVENTIL ) (2.5 MG/3ML) 0.083% nebulizer solution, Take 3 mLs (2.5 mg total) by nebulization every 4 (four) hours as needed for wheezing or shortness of breath. (Patient not taking: Reported on 09/13/2023), Disp: 150 mL, Rfl: 1   albuterol  (VENTOLIN  HFA) 108 (90 Base) MCG/ACT inhaler, Inhale 2 puffs into the lungs every 6 (six)  hours as needed for wheezing or shortness of breath., Disp: 18 g, Rfl: 1   allopurinol  (ZYLOPRIM ) 100 MG tablet, Take 1 tablet (100 mg total) by mouth daily., Disp: 90 tablet, Rfl: 0   Alpha-D-Galactosidase (BEANO PO), Take 2 tablets by mouth in the morning, at noon, and at bedtime., Disp: , Rfl:    Alpha-Lipoic Acid 600 MG TABS, Take 600 mg by mouth in the morning., Disp: , Rfl:    ampicillin  IVPB, Inject 2 g into the vein every 8 (eight) hours for 28 days. Indication:  E faecalis bacteremia/ICD infection First Dose: Yes Last Day of Therapy:  09/19/23 Labs - Once weekly:  CBC/D and BMP, Labs - Once weekly: ESR and CRP (Patient not taking: Reported on 09/13/2023), Disp: 84 Units, Rfl: 0   atorvastatin  (LIPITOR) 80 MG tablet, Take 1 tablet (80 mg total) by mouth every evening., Disp: 90 tablet, Rfl: 1   Azelastine  HCl 137 MCG/SPRAY SOLN, 2 sprays once per day (Patient not taking: Reported on 09/13/2023), Disp: 30 mL, Rfl: 2   Calcium -Magnesium -Vitamin D  (CITRACAL CALCIUM +D) 600-40-500 MG-MG-UNIT TB24, Take 1 tablet by mouth daily., Disp: , Rfl:    cefTRIAXone  (ROCEPHIN ) IVPB, Inject 2 g into the vein every 12 (twelve) hours for 28 days. Indication:  E faecalis bacteremia/ICD infection First Dose: Yes Last Day of Therapy:  09/19/23 Labs - Once weekly:  CBC/D and BMP, Labs - Once weekly: ESR and CRP (Patient not taking: Reported on 09/13/2023), Disp: 56 Units, Rfl: 0   cetirizine  (ZYRTEC ) 10 MG tablet, Take 1 tablet (10 mg total) by mouth daily as needed for allergies (Can take a n extra dose during flare ups.)., Disp: 180 tablet, Rfl: 1   Cholecalciferol  (VITAMIN D3) 25 MCG (1000 UT) capsule, Take 1,000 Units by mouth at bedtime., Disp: , Rfl:    denosumab  (PROLIA ) 60 MG/ML SOSY injection, Inject 60 mg into the skin every 6 (six) months. (Patient not taking: Reported on 09/13/2023), Disp: , Rfl:    dexlansoprazole  (DEXILANT ) 60 MG capsule, Take 1 capsule (60 mg total) by mouth in the morning., Disp: 90 capsule,  Rfl: 1   diclofenac  Sodium (VOLTAREN ) 1 % GEL, Apply 1 Application topically as needed (pain). (Patient not taking: Reported on 09/13/2023), Disp: , Rfl:    empagliflozin  (JARDIANCE ) 10 MG TABS tablet, Take 1 tablet (10 mg total) by mouth daily., Disp: 90 tablet, Rfl: 1   ENTRESTO  24-26 MG, TAKE 1 TABLET BY MOUTH TWICE A DAY, Disp: 180 tablet, Rfl: 0   EPINEPHRINE  0.3 mg/0.3 mL IJ SOAJ injection, USE AS DIRECTED BY YOUR PHYSICIAN INTRAMUSCULARLY AS NEEDED FOR ANAPHYLAXIS, Disp: 2 each, Rfl: 1   famotidine  (PEPCID ) 20 MG tablet, TAKE 1 TABLET (20 MG) BY MOUTH DAILY BEFORE LUNCH AND1 TABLET BY MOUTH EACH NIGHT AT BEDTIME, Disp: 180 tablet, Rfl: 1   fluticasone  (FLONASE ) 50 MCG/ACT nasal spray, Place 2 sprays into both nostrils daily. (Patient not taking: Reported on 09/13/2023), Disp: 16 g, Rfl: 6   Fluticasone -Umeclidin-Vilant (TRELEGY ELLIPTA ) 200-62.5-25 MCG/ACT AEPB, INHALE 1 INHALATION INTO THE LUNGS IN THE MORNING., Disp: 120 each, Rfl: 1   folic acid  (FOLVITE ) 1 MG tablet, Take 1 tablet (1 mg total) by mouth daily., Disp: 90 tablet, Rfl: 3   furosemide  (LASIX ) 40 MG tablet, Take 0.5 tablets (20 mg total) by mouth daily as needed for fluid or edema (Weight gain, edema >3lbs). Monday and Thursday, Disp: , Rfl:    gabapentin  (NEURONTIN ) 300 MG capsule, Take 1 capsule (300 mg total) by mouth at bedtime., Disp: , Rfl:    guaiFENesin  (MUCINEX ) 600 MG 12 hr tablet, Take 600 mg by mouth 2 (two) times daily., Disp: , Rfl:    HIZENTRA  10 GM/50ML SOLN, Inject 15 g into the skin once a week. 13 g Infusion, Disp: 300 mL, Rfl: 11   ipratropium-albuterol  (DUONEB) 0.5-2.5 (3) MG/3ML SOLN, Take 3 mLs by nebulization every 6 (six) hours as needed. (Patient not taking: Reported on 09/13/2023), Disp: 360 mL, Rfl: 6   isosorbide -hydrALAZINE  (BIDIL ) 20-37.5 MG tablet, TAKE 1 TABLET BY MOUTH  TWICE A DAY, Disp: 180 tablet, Rfl: 0   Lactase (LACTOSE FAST ACTING RELIEF PO), Take 2 tablets by mouth 3 (three) times daily  before meals., Disp: , Rfl:    montelukast  (SINGULAIR ) 10 MG tablet, Take 1 tablet (10 mg total) by mouth daily after supper., Disp: 90 tablet, Rfl: 1   mupirocin  ointment (BACTROBAN ) 2 %, Apply 1 Application topically as needed (wounds). (Patient not taking: Reported on 09/13/2023), Disp: 30 g, Rfl: 3   nystatin  (MYCOSTATIN ) 100000 UNIT/ML suspension, Swish, gargle, and swallow 5 ml after Trelegy use (Patient not taking: Reported on 09/13/2023), Disp: 473 mL, Rfl: 0   predniSONE  (DELTASONE ) 5 MG tablet, Take 1 tablet (5 mg total) by mouth daily with breakfast., Disp: 90 tablet, Rfl: 0   Prenatal Vit-Fe Fumarate-FA (PRENATAL VITAMINS PO), Take 1 tablet by mouth in the morning., Disp: , Rfl:    Probiotic Product (ALIGN) 4 MG CAPS, Take 4 mg by mouth every evening., Disp: , Rfl:    Respiratory Therapy Supplies (NEBULIZER MASK ADULT) MISC, 1 Device by Does not apply route as directed. (Patient not taking: Reported on 09/13/2023), Disp: 1 each, Rfl: 1   rivaroxaban  (XARELTO ) 20 MG TABS tablet, Take 1 tablet (20 mg total) by mouth daily with supper., Disp: , Rfl:    sodium bicarbonate  650 MG tablet, Take 650 mg by mouth daily., Disp: , Rfl:    sodium chloride  HYPERTONIC 3 % nebulizer solution, Take 4 mLs by nebulization at bedtime. (Patient not taking: Reported on 09/13/2023), Disp: , Rfl:    traZODone  (DESYREL ) 50 MG tablet, Take 100 mg by mouth at bedtime as needed., Disp: , Rfl:

## 2023-09-19 ENCOUNTER — Encounter: Payer: Self-pay | Admitting: Pulmonary Disease

## 2023-09-20 DIAGNOSIS — R7881 Bacteremia: Secondary | ICD-10-CM | POA: Diagnosis not present

## 2023-09-20 DIAGNOSIS — Z7409 Other reduced mobility: Secondary | ICD-10-CM | POA: Diagnosis not present

## 2023-09-20 DIAGNOSIS — M6281 Muscle weakness (generalized): Secondary | ICD-10-CM | POA: Diagnosis not present

## 2023-09-20 DIAGNOSIS — E872 Acidosis, unspecified: Secondary | ICD-10-CM | POA: Diagnosis not present

## 2023-09-20 DIAGNOSIS — Z792 Long term (current) use of antibiotics: Secondary | ICD-10-CM | POA: Diagnosis not present

## 2023-09-23 DIAGNOSIS — M79671 Pain in right foot: Secondary | ICD-10-CM | POA: Diagnosis not present

## 2023-09-23 DIAGNOSIS — E872 Acidosis, unspecified: Secondary | ICD-10-CM | POA: Diagnosis not present

## 2023-09-23 DIAGNOSIS — R7881 Bacteremia: Secondary | ICD-10-CM | POA: Diagnosis not present

## 2023-09-23 DIAGNOSIS — Z7409 Other reduced mobility: Secondary | ICD-10-CM | POA: Diagnosis not present

## 2023-09-23 DIAGNOSIS — Z792 Long term (current) use of antibiotics: Secondary | ICD-10-CM | POA: Diagnosis not present

## 2023-09-23 DIAGNOSIS — M79672 Pain in left foot: Secondary | ICD-10-CM | POA: Diagnosis not present

## 2023-09-23 DIAGNOSIS — M6281 Muscle weakness (generalized): Secondary | ICD-10-CM | POA: Diagnosis not present

## 2023-09-24 DIAGNOSIS — M79671 Pain in right foot: Secondary | ICD-10-CM | POA: Diagnosis not present

## 2023-09-24 DIAGNOSIS — G8929 Other chronic pain: Secondary | ICD-10-CM | POA: Diagnosis not present

## 2023-09-24 DIAGNOSIS — M6281 Muscle weakness (generalized): Secondary | ICD-10-CM | POA: Diagnosis not present

## 2023-09-24 DIAGNOSIS — R7881 Bacteremia: Secondary | ICD-10-CM | POA: Diagnosis not present

## 2023-09-24 DIAGNOSIS — M79672 Pain in left foot: Secondary | ICD-10-CM | POA: Diagnosis not present

## 2023-09-24 DIAGNOSIS — Z792 Long term (current) use of antibiotics: Secondary | ICD-10-CM | POA: Diagnosis not present

## 2023-09-24 DIAGNOSIS — Z7409 Other reduced mobility: Secondary | ICD-10-CM | POA: Diagnosis not present

## 2023-09-25 ENCOUNTER — Encounter: Payer: Self-pay | Admitting: Cardiovascular Disease

## 2023-09-25 ENCOUNTER — Ambulatory Visit: Attending: Cardiology | Admitting: Cardiovascular Disease

## 2023-09-25 VITALS — BP 100/58 | HR 71 | Ht 60.0 in | Wt 131.0 lb

## 2023-09-25 DIAGNOSIS — R262 Difficulty in walking, not elsewhere classified: Secondary | ICD-10-CM | POA: Diagnosis not present

## 2023-09-25 DIAGNOSIS — I5042 Chronic combined systolic (congestive) and diastolic (congestive) heart failure: Secondary | ICD-10-CM | POA: Diagnosis not present

## 2023-09-25 DIAGNOSIS — E119 Type 2 diabetes mellitus without complications: Secondary | ICD-10-CM | POA: Diagnosis not present

## 2023-09-25 DIAGNOSIS — J189 Pneumonia, unspecified organism: Secondary | ICD-10-CM | POA: Diagnosis not present

## 2023-09-25 DIAGNOSIS — I429 Cardiomyopathy, unspecified: Secondary | ICD-10-CM | POA: Diagnosis not present

## 2023-09-25 DIAGNOSIS — R7881 Bacteremia: Secondary | ICD-10-CM | POA: Diagnosis not present

## 2023-09-25 NOTE — Progress Notes (Signed)
  Electrophysiology Office Note:    Date:  09/25/2023   ID:  Ebony Scott, DOB 11/02/1950, MRN [id number]  PCP:  [name], MD   [facility] Providers Cardiologist:  Shelda Bruckner, MD Electrophysiologist:  Eulas FORBES Furbish, MD     Referring MD: [name], MD   History of Present Illness:    Ebony Scott is a 73 y.o. female with a complicated medical history significant for CHF with reduced EF, CAD s/p LAD DES in 12/2020, tracheobronchomalacia, DVT, obstructive sleep apnea referred for ICD.     She was diagnosed with heart failure in July 2022 when hospitalized with acute renal failure and acute heart failure.  Echocardiogram showed on ejection fraction of 35 to 40% with wall motion abnormalities. GDMT was limited due to AKI. She has been on GDMT for greater than 3 months.  She underwent placement of a single-chamber ICD in July 2024.  Unfortunately, approximately a year later she developed enterococcal sepsis and the single-chamber ICD was extracted.  With rheumatoid arthritis on chronic prednisone , she is immunosuppressed, which almost certainly contributed to her bacteremia and ICD infection.       EKGs/Labs/Other Studies Reviewed Today:    Echocardiogram:  07/17/2022 EF 30-35%, with WMA. Moderately dilated LA   Monitors:   Stress testing:    Advanced imaging:   Cardiac catherization    EKG:         Physical Exam:    VS:  BP (!) 100/58 (BP Location: Left Arm, Patient Position: Sitting, Cuff Size: Normal)   Pulse 71   Ht 5' (1.524 m)   Wt 131 lb (59.4 kg)   SpO2 93%   BMI 25.58 kg/m     Wt Readings from Last 3 Encounters:  09/25/23 131 lb (59.4 kg)  09/18/23 121 lb (54.9 kg)  08/09/23 122 lb 5.7 oz (55.5 kg)     GEN: Well nourished, well developed in no acute distress CARDIAC: RRR, no murmurs, rubs, gallops RESPIRATORY:  Normal work of breathing MUSCULOSKELETAL: no edema    ASSESSMENT & PLAN:    CHFrEF EF <  35% despite maximum tolerated GDMT Due to the history of device infection, ongoing steroid use, severe pulmonary disease, I do not think ICD reimplantation is advised.  Patient agrees and states that she would not want ICD reimplantation   Immune deficient We discussed the increased infection risk   CAD S/p PCI of LAD Continue clopidogrel  75, atorvastatin  80  I will discharge her from electrophysiology clinic and see her again if new arrhythmia issues arise.   Signed, Eulas FORBES Furbish, MD  09/25/2023 4:31 PM    [facility]

## 2023-09-25 NOTE — Patient Instructions (Signed)
 Medication Instructions:  Your physician recommends that you continue on your current medications as directed. Please refer to the Current Medication list given to you today.  *If you need a refill on your cardiac medications before your next appointment, please call your pharmacy*  Lab Work: None ordered.  If you have labs (blood work) drawn today and your tests are completely normal, you will receive your results only by: MyChart Message (if you have MyChart) OR A paper copy in the mail If you have any lab test that is abnormal or we need to change your treatment, we will call you to review the results.  Testing/Procedures: None ordered.   Follow-Up: At [facility], you and your health needs are our priority.  As part of our continuing mission to provide you with exceptional heart care, our providers are all part of one team.  This team includes your primary Cardiologist (physician) and Advanced Practice Providers or APPs (Physician Assistants and Nurse Practitioners) who all work together to provide you with the care you need, when you need it.  Your next appointment:   Follow up with Dr Mealor as needed

## 2023-09-26 ENCOUNTER — Ambulatory Visit

## 2023-09-26 DIAGNOSIS — Z792 Long term (current) use of antibiotics: Secondary | ICD-10-CM | POA: Diagnosis not present

## 2023-09-26 DIAGNOSIS — Z7409 Other reduced mobility: Secondary | ICD-10-CM | POA: Diagnosis not present

## 2023-09-26 DIAGNOSIS — R7881 Bacteremia: Secondary | ICD-10-CM | POA: Diagnosis not present

## 2023-09-26 DIAGNOSIS — M6281 Muscle weakness (generalized): Secondary | ICD-10-CM | POA: Diagnosis not present

## 2023-09-29 DIAGNOSIS — Z7409 Other reduced mobility: Secondary | ICD-10-CM | POA: Diagnosis not present

## 2023-09-29 DIAGNOSIS — Z792 Long term (current) use of antibiotics: Secondary | ICD-10-CM | POA: Diagnosis not present

## 2023-09-29 DIAGNOSIS — M6281 Muscle weakness (generalized): Secondary | ICD-10-CM | POA: Diagnosis not present

## 2023-09-29 DIAGNOSIS — R7881 Bacteremia: Secondary | ICD-10-CM | POA: Diagnosis not present

## 2023-09-29 DIAGNOSIS — G8929 Other chronic pain: Secondary | ICD-10-CM | POA: Diagnosis not present

## 2023-09-30 DIAGNOSIS — M79672 Pain in left foot: Secondary | ICD-10-CM | POA: Diagnosis not present

## 2023-09-30 DIAGNOSIS — M6281 Muscle weakness (generalized): Secondary | ICD-10-CM | POA: Diagnosis not present

## 2023-09-30 DIAGNOSIS — Z7409 Other reduced mobility: Secondary | ICD-10-CM | POA: Diagnosis not present

## 2023-09-30 DIAGNOSIS — M79671 Pain in right foot: Secondary | ICD-10-CM | POA: Diagnosis not present

## 2023-10-01 ENCOUNTER — Ambulatory Visit

## 2023-10-01 ENCOUNTER — Ambulatory Visit: Admitting: Infectious Diseases

## 2023-10-01 ENCOUNTER — Encounter: Payer: Self-pay | Admitting: Infectious Diseases

## 2023-10-01 ENCOUNTER — Other Ambulatory Visit: Payer: Self-pay

## 2023-10-01 VITALS — BP 92/50 | HR 76 | Temp 97.6°F

## 2023-10-01 DIAGNOSIS — B952 Enterococcus as the cause of diseases classified elsewhere: Secondary | ICD-10-CM

## 2023-10-01 DIAGNOSIS — Z452 Encounter for adjustment and management of vascular access device: Secondary | ICD-10-CM

## 2023-10-01 DIAGNOSIS — T827XXD Infection and inflammatory reaction due to other cardiac and vascular devices, implants and grafts, subsequent encounter: Secondary | ICD-10-CM | POA: Diagnosis not present

## 2023-10-01 DIAGNOSIS — Z79899 Other long term (current) drug therapy: Secondary | ICD-10-CM

## 2023-10-01 DIAGNOSIS — R7881 Bacteremia: Secondary | ICD-10-CM

## 2023-10-01 NOTE — Progress Notes (Signed)
 Patient Active Problem List   Diagnosis Date Noted   Pacemaker infection (HCC) 09/04/2023   Medication management 09/03/2023   PICC (peripherally inserted central catheter) in place 09/03/2023   Enterococcus faecalis infection 08/08/2023   Enterococcal bacteremia 08/08/2023   Community acquired pneumonia 08/06/2023   Generalized weakness 08/06/2023   AKI (acute kidney injury) (HCC) 08/06/2023   Acute metabolic acidosis 05/20/2023   RLL pneumonia 05/18/2023   Nocturnal hypoxia - 2-3 L/min 05/18/2023   Acute respiratory failure with hypoxia (HCC) 05/18/2023   Current chronic use of systemic steroids - prednisone  7 mg daily for hx of RA 05/18/2023   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   Sepsis without acute organ dysfunction (HCC) 11/30/2022   Fever 09/01/2022   DNR (do not resuscitate) 08/31/2022   Noninfectious otitis externa of left ear 06/08/2021   Iron  deficiency anemia 02/24/2021   GAVE (gastric antral vascular ectasia)    Anticoagulated    Antiplatelet or antithrombotic long-term use    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   Coronary artery calcification seen on CAT scan 01/11/2021   History of DVT (deep vein thrombosis) 09/24/2020   DMII (diabetes mellitus, type 2) (HCC) 09/23/2020   Hoarseness 09/09/2020   S/P reverse total shoulder arthroplasty, right    DOE (dyspnea on exertion) 02/18/2020   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   S/P reverse total shoulder arthroplasty, left 02/16/2020   History of pulmonary embolus (PE) 12/19/2018   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   Rheumatoid arthritis (HCC)    Bilateral impacted cerumen 08/18/2018   Hypothyroidism 01/14/2018   Depression 01/14/2018   Debility    Protein-calorie malnutrition, moderate (HCC) 12/17/2017   Knee pain    History of breast cancer    Chronic pain syndrome    Fibromyalgia    Graves disease    Dysphonia 08/10/2017   Pulmonary nodules    Chronic kidney disease, stage 3b  (HCC) - baseline Scr 1.3-1.5 04/15/2016   Hx of adenomatous colonic polyps 04/12/2016   Opacity of lung on imaging study 11/03/2015   Severe persistent asthma 02/08/2015   Facial pain syndrome 02/08/2015   Insomnia 02/08/2015   Other allergic rhinitis 02/08/2015   LPRD (laryngopharyngeal reflux disease) 02/08/2015   Chronic systolic heart failure (HCC) 02/08/2015   Diastolic dysfunction 02/08/2015   Benign paroxysmal positional vertigo 12/03/2012   Disequilibrium 10/29/2012   Pseudogout 02/24/2012   Irritable bowel syndrome 01/02/2010   Hyperlipidemia 12/23/2009   Hyperthyroidism 11/12/2006   Seasonal and perennial allergic rhinitis 11/12/2006   GERD 11/12/2006   NECK PAIN, CHRONIC 11/12/2006   OSTEOPOROSIS 11/12/2006   BREAST CANCER, HX OF 11/12/2006    Patient's Medications  New Prescriptions   No medications on file  Previous Medications   ACETAMINOPHEN  (TYLENOL ) 500 MG TABLET    Take 1 tablet (500 mg total) by mouth every 6 (six) hours as needed for moderate pain (pain score 4-6) or mild pain (pain score 1-3).   ALBUTEROL  (PROVENTIL ) (2.5 MG/3ML) 0.083% NEBULIZER SOLUTION    Take 3 mLs (2.5 mg total) by nebulization every 4 (four) hours as needed for wheezing or shortness of breath.   ALBUTEROL  (VENTOLIN  HFA) 108 (90 BASE) MCG/ACT INHALER    Inhale 2 puffs into the lungs every 6 (six) hours as needed for wheezing or shortness of breath.   ALLOPURINOL  (ZYLOPRIM ) 100 MG TABLET    Take 1 tablet (100 mg total) by mouth daily.   ALPHA-D-GALACTOSIDASE (BEANO PO)    Take 2 tablets by mouth in the  morning, at noon, and at bedtime.   ALPHA-LIPOIC ACID 600 MG TABS    Take 600 mg by mouth in the morning.   ATORVASTATIN  (LIPITOR) 80 MG TABLET    Take 1 tablet (80 mg total) by mouth every evening.   AZELASTINE  HCL 137 MCG/SPRAY SOLN    2 sprays once per day   CALCIUM -MAGNESIUM -VITAMIN D  (CITRACAL CALCIUM +D) 600-40-500 MG-MG-UNIT TB24    Take 1 tablet by mouth daily.   CETIRIZINE  (ZYRTEC )  10 MG TABLET    Take 1 tablet (10 mg total) by mouth daily as needed for allergies (Can take a n extra dose during flare ups.).   CHOLECALCIFEROL  (VITAMIN D3) 25 MCG (1000 UT) CAPSULE    Take 1,000 Units by mouth at bedtime.   DENOSUMAB  (PROLIA ) 60 MG/ML SOSY INJECTION    Inject 60 mg into the skin every 6 (six) months.   DEXLANSOPRAZOLE  (DEXILANT ) 60 MG CAPSULE    Take 1 capsule (60 mg total) by mouth in the morning.   DICLOFENAC  SODIUM (VOLTAREN ) 1 % GEL    Apply 1 Application topically as needed (pain).   EMPAGLIFLOZIN  (JARDIANCE ) 10 MG TABS TABLET    Take 1 tablet (10 mg total) by mouth daily.   ENTRESTO  24-26 MG    TAKE 1 TABLET BY MOUTH TWICE A DAY   EPINEPHRINE  0.3 MG/0.3 ML IJ SOAJ INJECTION    USE AS DIRECTED BY YOUR PHYSICIAN INTRAMUSCULARLY AS NEEDED FOR ANAPHYLAXIS   FAMOTIDINE  (PEPCID ) 20 MG TABLET    TAKE 1 TABLET (20 MG) BY MOUTH DAILY BEFORE LUNCH AND1 TABLET BY MOUTH EACH NIGHT AT BEDTIME   FLUTICASONE  (FLONASE ) 50 MCG/ACT NASAL SPRAY    Place 2 sprays into both nostrils daily.   FLUTICASONE -UMECLIDIN-VILANT (TRELEGY ELLIPTA ) 200-62.5-25 MCG/ACT AEPB    Inhale 1 Inhalation into the lungs in the morning.   FOLIC ACID  (FOLVITE ) 1 MG TABLET    Take 1 tablet (1 mg total) by mouth daily.   FUROSEMIDE  (LASIX ) 40 MG TABLET    Take 0.5 tablets (20 mg total) by mouth daily as needed for fluid or edema (Weight gain, edema >3lbs). Monday and Thursday   GABAPENTIN  (NEURONTIN ) 300 MG CAPSULE    Take 1 capsule (300 mg total) by mouth at bedtime.   GUAIFENESIN  (MUCINEX ) 600 MG 12 HR TABLET    Take 600 mg by mouth 2 (two) times daily.   HIZENTRA  10 GM/50ML SOLN    Inject 15 g into the skin once a week. 13 g Infusion   IPRATROPIUM-ALBUTEROL  (DUONEB) 0.5-2.5 (3) MG/3ML SOLN    Take 3 mLs by nebulization every 6 (six) hours as needed.   ISOSORBIDE -HYDRALAZINE  (BIDIL ) 20-37.5 MG TABLET    TAKE 1 TABLET BY MOUTH TWICE A DAY   LACTASE (LACTOSE FAST ACTING RELIEF PO)    Take 2 tablets by mouth 3 (three)  times daily before meals.   MONTELUKAST  (SINGULAIR ) 10 MG TABLET    Take 1 tablet (10 mg total) by mouth daily after supper.   MUPIROCIN  OINTMENT (BACTROBAN ) 2 %    Apply 1 Application topically as needed (wounds).   NYSTATIN  (MYCOSTATIN ) 100000 UNIT/ML SUSPENSION    Swish, gargle, and swallow 5 ml after Trelegy use   PREDNISONE  (DELTASONE ) 5 MG TABLET    Take 1 tablet (5 mg total) by mouth daily with breakfast.   PRENATAL VIT-FE FUMARATE-FA (PRENATAL VITAMINS PO)    Take 1 tablet by mouth in the morning.   PROBIOTIC PRODUCT (ALIGN) 4 MG CAPS  Take 4 mg by mouth every evening.   RESPIRATORY THERAPY SUPPLIES (NEBULIZER MASK ADULT) MISC    1 Device by Does not apply route as directed.   RIVAROXABAN  (XARELTO ) 20 MG TABS TABLET    Take 1 tablet (20 mg total) by mouth daily with supper.   SODIUM BICARBONATE  650 MG TABLET    Take 650 mg by mouth daily.   SODIUM CHLORIDE  HYPERTONIC 3 % NEBULIZER SOLUTION    Take 4 mLs by nebulization at bedtime.   TRAZODONE  (DESYREL ) 50 MG TABLET    Take 100 mg by mouth at bedtime as needed.  Modified Medications   No medications on file  Discontinued Medications   No medications on file    Subjective: Discussed the use of AI scribe software for clinical note transcription with the patient, who gave verbal consent to proceed.   73 year old female with prior history of RA on chronic prednisone , breast cancer in remission, history of C. 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 Discharged on 7/3 to complete 6 weeks of IV ampicillin  and ceftriaxone  through 7/31 after removal of PPM on 7/2.    7/15 She is here by   herself from SNF. Mar reviewed which confirms she is on IV ampicillin  and ceftriaxone . No issues with antibiotics or PICC. Prior PPM site sore but no other concerns.    8/12 She is by herself from SNF. MAR reviewed and getting IV ampicillin  and IV ceftriaxone . No concerns with PICC or antibiotics. She is able to walk with walker. Seen by Rheumatology on 7/25, Pulmonary 7/30 and Cardiology 8/6. No complaints today.   Review of Systems: all systems reviewed  with pertinent positives and negatives as listed above   Past Medical History:  Diagnosis Date   Allergy     Anemia    Angio-edema    Breast cancer (HCC) 1998   Left breast, in remission   Bronchiectasis (HCC)    Cataract    CHF (congestive heart failure) (HCC)    Closed displaced fracture of metatarsal bone of right foot 01/20/2019   Closed fracture of fifth metatarsal bone of right foot, initial encounter 01/15/2018   Clostridium difficile colitis 01/14/2018   Coronary artery disease    Depression    some; don't take anything for it (02/20/2012), pt denies dx as of 06/02/21   Diverticulosis    DVT (deep venous thrombosis) (HCC)    Fibromyalgia 11/2011   Fracture of base of fifth metatarsal bone with routine healing, left    GAVE (gastric antral vascular ectasia)    GERD (gastroesophageal reflux disease)    Headache(784.0)    related to allergies; more at different times during the year (02/20/2012)   Hemorrhoids    Hiatal hernia    back and neck   History of blood transfusion 2023   1 unit, 5 units of Iron    Hx of adenomatous colonic polyps 04/12/2016   Hypercholesteremia    good cholesterol is high   Hypothyroidism    h/o Graves disease   IBS (irritable bowel syndrome)    Moderate persistent asthma    -FeV1 72% 2011, -IgE 102 2011, CT sinus Neg 2011   Osteomyelitis of second toe of right  foot (HCC) 09/23/2020   Osteoporosis    on reclast  yearly   Oxygen  deficiency    Pneumonia    Pulmonary embolism (HCC)  07/29/2018   Septic olecranon bursitis of right elbow 09/22/2020   Seronegative rheumatoid arthritis (HCC)    Dr. Cindy Setter   Sleep apnea    uses cpap nightly   Tracheobronchomalacia    Vasculitis (HCC) 12/13/2017   Past Surgical History:  Procedure Laterality Date   ABDOMINAL HYSTERECTOMY N/A    Phreesia 10/21/2019   ANTERIOR AND POSTERIOR REPAIR  1990's   APPENDECTOMY     ARGON LASER APPLICATION  02/05/2021   Procedure: ARGON LASER APPLICATION;  Surgeon: Leigh Elspeth SQUIBB, MD;  Location: [facility];  Service: Gastroenterology;;   BREAST LUMPECTOMY  1998   left   BREAST SURGERY N/A    Phreesia 10/21/2019   BRONCHIAL WASHINGS  04/05/2020   Procedure: BRONCHIAL WASHINGS;  Surgeon: Kara Dorn NOVAK, MD;  Location: [facility];  Service: Pulmonary;;   CAPSULOTOMY Right 08/28/2021   Procedure: CAPSULOTOMY;  Surgeon: Tobie Franky SQUIBB, DPM;  Location: [facility];  Service: Podiatry;  Laterality: Right;   CARPOMETACARPEL (CMC) FUSION OF THUMB WITH AUTOGRAFT FROM RADIUS  ~ 2009   both thumbs (02/20/2012)   CATARACT EXTRACTION W/ INTRAOCULAR LENS  IMPLANT, BILATERAL  2012   CERVICAL DISCECTOMY  10/2001   C5-C6   CERVICAL FUSION  2003   C3-C4   CHOLECYSTECTOMY     COLONOSCOPY     CORONARY STENT INTERVENTION N/A 01/11/2021   Procedure: CORONARY STENT INTERVENTION;  Surgeon: Anner Alm ORN, MD;  Location: [facility] INVASIVE CV LAB;  Service: Cardiovascular;  Laterality: N/A;   DEBRIDEMENT TENNIS ELBOW  ?1970's   right   ESOPHAGOGASTRODUODENOSCOPY     ESOPHAGOGASTRODUODENOSCOPY (EGD) WITH PROPOFOL  N/A 02/05/2021   Procedure: ESOPHAGOGASTRODUODENOSCOPY (EGD) WITH PROPOFOL ;  Surgeon: Leigh Elspeth SQUIBB, MD;  Location: [facility];  Service: Gastroenterology;  Laterality: N/A;   EYE SURGERY N/A    Phreesia 10/21/2019   HAMMER TOE SURGERY Right 08/28/2021   Procedure: HAMMER TOE REPAIR 2ND TOE RIGHT FOOT;  Surgeon: Tobie Franky SQUIBB, DPM;  Location[facility];  Service: Podiatry;  Laterality:  Right;   HYSTERECTOMY     I & D EXTREMITY Right 09/22/2020   Procedure: IRRIGATION AND DEBRIDEMENT RIGHT HAND AND ELBOW;  Surgeon: Josefina Chew, MD;  Location: [facility];  Service: Orthopedics;  Laterality: Right;   ICD GENERATOR REMOVAL N/A 08/21/2023   Procedure: ICD GENERATOR REMOVAL;  Surgeon: Waddell Danelle ORN, MD;  Location: [facility] INVASIVE CV LAB;  Service: Cardiovascular;  Laterality: N/A;   ICD IMPLANT N/A 08/27/2022   Procedure: ICD IMPLANT;  Surgeon: Nancey Eulas BRAVO, MD;  Location: [facility];  Service: Cardiovascular;  Laterality: N/A;   JOINT REPLACEMENT  02/15/2021   KNEE ARTHROPLASTY  ?8009'd   ?right; w/cartilage repair (02/20/2012)   LEAD EXTRACTION N/A 08/21/2023   Procedure: LEAD EXTRACTION;  Surgeon: Waddell Danelle ORN, MD;  [facility];  Service: Cardiovascular;  Laterality: N/A;   MASS EXCISION Left 08/28/2021   Procedure: EXCISION OF SOFT TISSUE  MASS LEFT FOOT;  Surgeon: Tobie Franky SQUIBB, DPM;  Location: [facility];  Service: Podiatry;  Laterality: Left;   NASAL SEPTUM SURGERY  1980's   POSTERIOR CERVICAL FUSION/FORAMINOTOMY  2004   failed initial fusion; rewired  anterior neck (02/20/2012)   REVERSE SHOULDER ARTHROPLASTY Right 02/16/2020   Procedure: REVERSE SHOULDER ARTHROPLASTY;  Surgeon: Josefina Chew, MD;  Location: [facility];  Service:  Orthopedics;  Laterality: Right;   RIGHT/LEFT HEART CATH AND CORONARY ANGIOGRAPHY N/A 01/11/2021   Procedure: RIGHT/LEFT HEART CATH AND CORONARY ANGIOGRAPHY;  Surgeon: Anner Alm ORN, MD;  Location: [facility] INVASIVE CV LAB;  Service: Cardiovascular;  Laterality: N/A;   SPINE SURGERY N/A    Phreesia 10/21/2019   TONSILLECTOMY  ~ 1953   TRANSESOPHAGEAL ECHOCARDIOGRAM (CATH LAB) N/A 08/12/2023   Procedure: TRANSESOPHAGEAL ECHOCARDIOGRAM;  Surgeon: Pietro Redell RAMAN, MD;  Location: [facility] INVASIVE CV LAB;  Service: Cardiovascular;  Laterality: N/A;   VESICOVAGINAL FISTULA CLOSURE W/ TAH  1988   VIDEO BRONCHOSCOPY Bilateral  08/23/2016   Procedure: VIDEO BRONCHOSCOPY WITH FLUORO;  Surgeon: Noreen Tonnie BRAVO, MD;  [facility]: [facility];  Service: Cardiopulmonary;  Laterality: Bilateral;   VIDEO BRONCHOSCOPY N/A 04/05/2020   Procedure: VIDEO BRONCHOSCOPY WITHOUT FLUORO;  Surgeon: Kara Dorn NOVAK, MD;  Location: [facility];  Service: Pulmonary;  Laterality: N/A;     Social History   Tobacco Use   Smoking status: Never    Passive exposure: Past   Smokeless tobacco: Never   Tobacco comments:    Parents  Vaping Use   Vaping status: Never Used  Substance Use Topics   Alcohol  use: Not Currently    Alcohol /week: 0.0 standard drinks of alcohol    Drug use: No    Family History  Problem Relation Age of Onset   Allergies Mother    Heart disease Mother    Arthritis Mother    Lung cancer Mother        heavy smoker   Diabetes Mother    Cancer Mother    Miscarriages / Stillbirths Mother    Obesity Mother    Allergies Father    Heart disease Father    Arthritis Father    Stroke Father    Alcohol  abuse Father    Hypertension Father    Heart disease Brother    Diabetes Maternal Grandfather    Colitis Daughter    Colon cancer Other        Maternal half uncle   Heart disease Brother    Drug abuse Brother    Early death Brother    Drug abuse Brother     Allergies  Allergen Reactions   Baclofen  Anaphylaxis and Other (See Comments)    Altered mental status requiring a 3-day hospital stay- patient became unresponsive   Put me in a coma for five days, Altered mental status requiring a 3-day hospital stay- patient became unresponsive   Other Shortness Of Breath and Other (See Comments)    Grass and weeds sneezing; filled sinuses (02/20/2012)   Pork-Derived Products Anaphylaxis   Shellfish Allergy  Anaphylaxis, Shortness Of Breath, Itching and Rash   Shrimp Extract Anaphylaxis and Other (See Comments)    ALL SHELLFISH   Tetracycline Hcl Nausea And Vomiting   Xolair  [Omalizumab ] Other (See  Comments)    Caused Blood clot   Zoledronic  Acid Other (See Comments)    Reclast - Fever, Put in hospital, dr said it was a reaction from a reaction    Fever, inability to walk, Put in hospital  Fever, Put in hospital, dr said it was a reaction from a reaction , Other reaction(s): Unknown   Celecoxib Swelling    Feet swelling   Dilaudid  [Hydromorphone  Hcl] Itching   Hydrocodone -Acetaminophen  Itching   Levofloxacin  Other (See Comments)    GI upset   Molds & Smuts Cough   Morphine  And Codeine Hives and Itching   Oxycodone  Hcl Nausea And Vomiting  GI Intolerance   Paroxetine Nausea And Vomiting and Other (See Comments)    GI Intolerance also   Tetracycline Hcl Other (See Comments)    GI Intolerance   Oxycodone -Acetaminophen  Itching   Diltiazem  Hcl Swelling   Dust Mite Extract Other (See Comments) and Cough    sneezing (02/20/2012) too   Gamunex [Immune Globulin ] Itching and Rash    Tolerates hizentra     Rituximab  Other (See Comments)    Anemia    Rofecoxib Swelling    Vioxx- feet swelling   Tree Extract Itching    tested and told I was allergic to it; never experienced a reaction to it (02/20/2012)    Health Maintenance  Topic Date Due   FOOT EXAM  Never done   Diabetic kidney evaluation - Urine ACR  Never done   Zoster Vaccines- Shingrix (2 of 2) 11/13/2017   COVID-19 Vaccine (8 - 2024-25 season) 10/21/2022   DTaP/Tdap/Td (5 - Td or Tdap) 08/19/2023   INFLUENZA VACCINE  09/20/2023   Medicare Annual Wellness (AWV)  11/22/2023   HEMOGLOBIN A1C  02/06/2024   MAMMOGRAM  04/17/2024   OPHTHALMOLOGY EXAM  04/17/2024   Diabetic kidney evaluation - eGFR measurement  08/21/2024   Pneumococcal Vaccine: 50+ Years  Completed   Hepatitis B Vaccines  Completed   DEXA SCAN  Completed   Hepatitis C Screening  Completed   HPV VACCINES  Aged Out   Meningococcal B Vaccine  Aged Out   Colonoscopy  Discontinued    Objective: BP (!) 92/50   Pulse 76   Temp 97.6 F (36.4 C)  (Temporal)   SpO2 96%     Physical Exam Constitutional:      Appearance: Normal appearance.  HENT:     Head: Normocephalic and atraumatic.      Mouth: Mucous membranes are moist.  Eyes:    Conjunctiva/sclera: Conjunctivae normal.     Pupils: Pupils are equal, round, and b/l symmetrical    Cardiovascular:     Rate and Rhythm: Normal rate and regular rhythm.     Heart sounds:   Pulmonary:     Effort: Pulmonary effort is normal.     Breath sounds:  Abdominal:     General: Non distended     Palpations:   Musculoskeletal:        General: sitting in the wheelchair  Skin:    General: Skin is warm and dry.     Comments: rt arm PICC Ok with no signs of infection.  Neurological:     General:     Mental Status: awake, alert and oriented to person, place, and time.   Psychiatric:        Mood and Affect: Mood normal.   Lab Results Lab Results  Component Value Date   WBC 8.7 08/21/2023   HGB 8.6 (L) 08/21/2023   HCT 27.8 (L) 08/21/2023   MCV 101.8 (H) 08/21/2023   PLT 166 08/21/2023    Lab Results  Component Value Date   CREATININE 1.61 (H) 08/22/2023   BUN 22 08/22/2023   NA 138 08/22/2023   K 4.0 08/22/2023   CL 108 08/22/2023   CO2 21 (L) 08/22/2023    Lab Results  Component Value Date   ALT 29 08/14/2023   AST 38 08/14/2023   ALKPHOS 36 (L) 08/14/2023   BILITOT 0.6 08/14/2023    Lab Results  Component Value Date   CHOL 123 09/11/2022   HDL 56 09/11/2022   LDLCALC 39 09/11/2022  LDLDIRECT 48 08/07/2022   TRIG 223 (H) 09/11/2022   CHOLHDL 2.2 09/11/2022   No results found for: LABRPR, RPRTITER No results found for: HIV1RNAQUANT, HIV1RNAVL, CD4TABS  Microbiology Results for orders placed or performed during the hospital encounter of 08/06/23  Resp panel by RT-PCR (RSV, Flu A&B, Covid) Anterior Nasal Swab     Status: None   Collection Time: 08/06/23  4:04 PM   Specimen: Anterior Nasal Swab  Result Value Ref Range Status   SARS  Coronavirus 2 by RT PCR NEGATIVE NEGATIVE Final    Comment: (NOTE) SARS-CoV-2 target nucleic acids are NOT DETECTED.  The SARS-CoV-2 RNA is generally detectable in upper respiratory specimens during the acute phase of infection. The lowest concentration of SARS-CoV-2 viral copies this assay can detect is 138 copies/mL. A negative result does not preclude SARS-Cov-2 infection and should not be used as the sole basis for treatment or other patient management decisions. A negative result may occur with  improper specimen collection/handling, submission of specimen other than nasopharyngeal swab, presence of viral mutation(s) within the areas targeted by this assay, and inadequate number of viral copies(<138 copies/mL). A negative result must be combined with clinical observations, patient history, and epidemiological information. The expected result is Negative.  Fact Sheet for Patients:  BloggerCourse.com  Fact Sheet for Healthcare Providers:  SeriousBroker.it  This test is no t yet approved or cleared by the [place]  FDA and  has been authorized for detection and/or diagnosis of SARS-CoV-2 by FDA under an Emergency Use Authorization (EUA). This EUA will remain  in effect (meaning this test can be used) for the duration of the COVID-19 declaration under Section 564(b)(1) of the Act, 21 U.S.C.section 360bbb-3(b)(1), unless the authorization is terminated  or revoked sooner.       Influenza A by PCR NEGATIVE NEGATIVE Final   Influenza B by PCR NEGATIVE NEGATIVE Final    Comment: (NOTE) The Xpert Xpress SARS-CoV-2/FLU/RSV plus assay is intended as an aid in the diagnosis of influenza from Nasopharyngeal swab specimens and should not be used as a sole basis for treatment. Nasal washings and aspirates are unacceptable for Xpert Xpress SARS-CoV-2/FLU/RSV testing.  Fact Sheet for  Patients: BloggerCourse.com  Fact Sheet for Healthcare Providers: SeriousBroker.it  This test is not yet approved or cleared by the [place]  FDA and has been authorized for detection and/or diagnosis of SARS-CoV-2 by FDA under an Emergency Use Authorization (EUA). This EUA will remain in effect (meaning this test can be used) for the duration of the COVID-19 declaration under Section 564(b)(1) of the Act, 21 U.S.C. section 360bbb-3(b)(1), unless the authorization is terminated or revoked.     Resp Syncytial Virus by PCR NEGATIVE NEGATIVE Final    Comment: (NOTE) Fact Sheet for Patients: BloggerCourse.com  Fact Sheet for Healthcare Providers: SeriousBroker.it  This test is not yet approved or cleared by the [place]  FDA and has been authorized for detection and/or diagnosis of SARS-CoV-2 by FDA under an Emergency Use Authorization (EUA). This EUA will remain in effect (meaning this test can be used) for the duration of the COVID-19 declaration under Section 564(b)(1) of the Act, 21 U.S.C. section 360bbb-3(b)(1), unless the authorization is terminated or revoked.  Performed at [facility], [address] [place], [place] [address]   Blood Culture (routine x 2)     Status: Abnormal   Collection Time: 08/06/23  6:41 PM   Specimen: Site Not Specified; Blood  Result Value Ref Range Status   Specimen  Description   Final    SITE NOT SPECIFIED Performed at [facility], [address], [place], [place] [address]    Special Requests   Final    BOTTLES DRAWN AEROBIC ONLY Blood Culture results may not be optimal due to an inadequate volume of blood received in culture bottles Performed at [facility], [address] [place], [place] [address]    Culture  Setup Time   Final    GRAM POSITIVE COCCI IN CHAINS AEROBIC  BOTTLE ONLY CRITICAL RESULT CALLED TO, READ BACK BY AND VERIFIED WITH: PHARMD TERRY GREEN ON 08/07/23 @ 1227 BY DRT Performed at [facility], [address], [place], [place] [address]    Culture ENTEROCOCCUS FAECALIS (A)  Final   Report Status 08/09/2023 FINAL  Final   Organism ID, Bacteria ENTEROCOCCUS FAECALIS  Final      Susceptibility   Enterococcus faecalis - MIC*    AMPICILLIN  <=2 SENSITIVE Sensitive     VANCOMYCIN  1 SENSITIVE Sensitive     GENTAMICIN  SYNERGY SENSITIVE Sensitive     * ENTEROCOCCUS FAECALIS  Blood Culture ID Panel (Reflexed)     Status: Abnormal   Collection Time: 08/06/23  6:41 PM  Result Value Ref Range Status   Enterococcus faecalis DETECTED (A) NOT DETECTED Final    Comment: CRITICAL RESULT CALLED TO, READ BACK BY AND VERIFIED WITH: PHARMD TERRY GREEN ON 08/07/23 @ 1231 BY DRT    Enterococcus Faecium NOT DETECTED NOT DETECTED Final   Listeria monocytogenes NOT DETECTED NOT DETECTED Final   Staphylococcus species NOT DETECTED NOT DETECTED Final   Staphylococcus aureus (BCID) NOT DETECTED NOT DETECTED Final   Staphylococcus epidermidis NOT DETECTED NOT DETECTED Final   Staphylococcus lugdunensis NOT DETECTED NOT DETECTED Final   Streptococcus species NOT DETECTED NOT DETECTED Final   Streptococcus agalactiae NOT DETECTED NOT DETECTED Final   Streptococcus pneumoniae NOT DETECTED NOT DETECTED Final   Streptococcus pyogenes NOT DETECTED NOT DETECTED Final   A.calcoaceticus-baumannii NOT DETECTED NOT DETECTED Final   Bacteroides fragilis NOT DETECTED NOT DETECTED Final   Enterobacterales NOT DETECTED NOT DETECTED Final   Enterobacter cloacae complex NOT DETECTED NOT DETECTED Final   Escherichia coli NOT DETECTED NOT DETECTED Final   Klebsiella aerogenes NOT DETECTED NOT DETECTED Final   Klebsiella oxytoca NOT DETECTED NOT DETECTED Final   Klebsiella pneumoniae NOT DETECTED NOT DETECTED Final   Proteus species NOT DETECTED NOT DETECTED Final    Salmonella species NOT DETECTED NOT DETECTED Final   Serratia marcescens NOT DETECTED NOT DETECTED Final   Haemophilus influenzae NOT DETECTED NOT DETECTED Final   Neisseria meningitidis NOT DETECTED NOT DETECTED Final   Pseudomonas aeruginosa NOT DETECTED NOT DETECTED Final   Stenotrophomonas maltophilia NOT DETECTED NOT DETECTED Final   Candida albicans NOT DETECTED NOT DETECTED Final   Candida auris NOT DETECTED NOT DETECTED Final   Candida glabrata NOT DETECTED NOT DETECTED Final   Candida krusei NOT DETECTED NOT DETECTED Final   Candida parapsilosis NOT DETECTED NOT DETECTED Final   Candida tropicalis NOT DETECTED NOT DETECTED Final   Cryptococcus neoformans/gattii NOT DETECTED NOT DETECTED Final   Vancomycin  resistance NOT DETECTED NOT DETECTED Final    Comment: Performed at [facility], [address], [place], [place] [address]  Respiratory (~20 pathogens) panel by PCR     Status: None   Collection Time: 08/06/23  8:31 PM   Specimen: Nasal Mucosa; Respiratory  Result Value Ref Range Status  Adenovirus NOT DETECTED NOT DETECTED Final   Coronavirus 229E NOT DETECTED NOT DETECTED Final    Comment: (NOTE) The Coronavirus on the Respiratory Panel, DOES NOT test for the novel  Coronavirus (2019 nCoV)    Coronavirus HKU1 NOT DETECTED NOT DETECTED Final   Coronavirus NL63 NOT DETECTED NOT DETECTED Final   Coronavirus OC43 NOT DETECTED NOT DETECTED Final   Metapneumovirus NOT DETECTED NOT DETECTED Final   Rhinovirus / Enterovirus NOT DETECTED NOT DETECTED Final   Influenza A NOT DETECTED NOT DETECTED Final   Influenza B NOT DETECTED NOT DETECTED Final   Parainfluenza Virus 1 NOT DETECTED NOT DETECTED Final   Parainfluenza Virus 2 NOT DETECTED NOT DETECTED Final   Parainfluenza Virus 3 NOT DETECTED NOT DETECTED Final   Parainfluenza Virus 4 NOT DETECTED NOT DETECTED Final   Respiratory Syncytial Virus NOT DETECTED NOT DETECTED Final   Bordetella pertussis NOT DETECTED NOT  DETECTED Final   Bordetella Parapertussis NOT DETECTED NOT DETECTED Final   Chlamydophila pneumoniae NOT DETECTED NOT DETECTED Final   Mycoplasma pneumoniae NOT DETECTED NOT DETECTED Final    Comment: Performed at [facility], [address] [place], [place] [address]  MRSA Next Gen by PCR, Nasal     Status: None   Collection Time: 08/06/23  8:31 PM   Specimen: Nasal Mucosa; Nasal Swab  Result Value Ref Range Status   MRSA by PCR Next Gen NOT DETECTED NOT DETECTED Final    Comment: (NOTE) The GeneXpert MRSA Assay (FDA approved for NASAL specimens only), is one component of a comprehensive MRSA colonization surveillance program. It is not intended to diagnose MRSA infection nor to guide or monitor treatment for MRSA infections. Test performance is not FDA approved in patients less than 43 years old. Performed at [facility], [address], [place], [place] [address]   Blood Culture (routine x 2)     Status: Abnormal   Collection Time: 08/06/23  8:43 PM   Specimen: BLOOD RIGHT HAND  Result Value Ref Range Status   Specimen Description   Final    BLOOD RIGHT HAND Performed at [facility], [address] [place], [place] [address]    Special Requests   Final    BOTTLES DRAWN AEROBIC AND ANAEROBIC Blood Culture results may not be optimal due to an inadequate volume of blood received in culture bottles Performed at [facility], [address] [place], [place] [address]    Culture  Setup Time   Final    GRAM POSITIVE COCCI IN CHAINS ANAEROBIC BOTTLE ONLY CRITICAL VALUE NOTED.  VALUE IS CONSISTENT WITH PREVIOUSLY REPORTED AND CALLED VALUE. GRAM POSITIVE COCCI IN CLUSTERS AEROBIC BOTTLE ONLY Organism ID to follow    Culture (A)  Final    ENTEROCOCCUS FAECALIS SUSCEPTIBILITIES PERFORMED ON PREVIOUS CULTURE WITHIN THE LAST 5 DAYS. STAPHYLOCOCCUS EPIDERMIDIS THE SIGNIFICANCE OF ISOLATING THIS ORGANISM FROM A SINGLE SET OF BLOOD  CULTURES WHEN MULTIPLE SETS ARE DRAWN IS UNCERTAIN. PLEASE NOTIFY THE MICROBIOLOGY DEPARTMENT WITHIN ONE WEEK IF SPECIATION AND SENSITIVITIES ARE REQUIRED. Performed at [facility], [address], [place], [place] [address]    Report Status 08/09/2023 FINAL  Final  Culture, blood (Routine X 2) w Reflex to ID Panel     Status: None   Collection Time: 08/08/23  1:11 PM   Specimen: BLOOD RIGHT HAND  Result Value Ref Range Status   Specimen Description BLOOD RIGHT HAND  Final   Special  Requests   Final    BOTTLES DRAWN AEROBIC ONLY Blood Culture adequate volume   Culture   Final    NO GROWTH 5 DAYS Performed at [facility], [address] [place], [place] [address]    Report Status 08/13/2023 FINAL  Final  Culture, blood (Routine X 2) w Reflex to ID Panel     Status: None   Collection Time: 08/08/23  1:12 PM   Specimen: BLOOD RIGHT HAND  Result Value Ref Range Status   Specimen Description BLOOD RIGHT HAND  Final   Special Requests   Final    BOTTLES DRAWN AEROBIC ONLY Blood Culture adequate volume   Culture   Final    NO GROWTH 5 DAYS Performed at [facility], [address] [place], [place] [address]    Report Status 08/13/2023 FINAL  Final  Surgical pcr screen     Status: None   Collection Time: 08/20/23 12:01 PM   Specimen: Nasal Mucosa; Nasal Swab  Result Value Ref Range Status   MRSA, PCR NEGATIVE NEGATIVE Final   Staphylococcus aureus NEGATIVE NEGATIVE Final    Comment: (NOTE) The Xpert SA Assay (FDA approved for NASAL specimens in patients 38 years of age and older), is one component of a comprehensive surveillance program. It is not intended to diagnose infection nor to guide or monitor treatment. Performed at [facility], [address] [place], [place] [address]    *Note: Due to a large number of results and/or encounters for the requested time period, some results have not been displayed. A complete set of results can be found in  Results Review.   Imaging No results found.   Assessment/Plan # CIED associated E faecalis bacteremia - no GI or GU symptoms  - 6/17 UA negative for infection, strep pneumo, Legionella urine antigen negative - 6/19 blood cx NG in 5 days - 6/19 TTE with no vegetations  - 6/23 TEE no vegetation on RV lead or valves - 7/2 PPM extracted   Plan - complete IV ampicillin  and ceftriaxone  for 6 weeks post PPM extraction. 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 Professional time spent includes the following activities: Preparing to see the patient (review of tests), Obtaining and reviewing separately obtained history (notes from rheumatology, pulmonology and cardiology), Performing a medically appropriate examination and evaluation , Ordering medications, communicating with SNF staff, Documenting clinical information in the EMR, Independently interpreting results (not separately reported), Communicating results to the patient, Counseling and educating the patient and Care coordination (not separately reported).   Of note, portions of this note may have been created with voice recognition software. While this note has been edited for accuracy, occasional wrong-word or 'sound-a-like' substitutions may have occurred due to the inherent limitations of voice recognition software.   Ebony Joseph, MD [facility] 10/01/2023, 10:15 AM

## 2023-10-02 ENCOUNTER — Ambulatory Visit: Payer: Self-pay | Admitting: Infectious Diseases

## 2023-10-02 DIAGNOSIS — Z9981 Dependence on supplemental oxygen: Secondary | ICD-10-CM | POA: Diagnosis not present

## 2023-10-02 DIAGNOSIS — Z7409 Other reduced mobility: Secondary | ICD-10-CM | POA: Diagnosis not present

## 2023-10-02 DIAGNOSIS — J9610 Chronic respiratory failure, unspecified whether with hypoxia or hypercapnia: Secondary | ICD-10-CM | POA: Diagnosis not present

## 2023-10-02 DIAGNOSIS — M6281 Muscle weakness (generalized): Secondary | ICD-10-CM | POA: Diagnosis not present

## 2023-10-02 DIAGNOSIS — G894 Chronic pain syndrome: Secondary | ICD-10-CM | POA: Diagnosis not present

## 2023-10-02 DIAGNOSIS — N183 Chronic kidney disease, stage 3 unspecified: Secondary | ICD-10-CM | POA: Diagnosis not present

## 2023-10-02 DIAGNOSIS — D649 Anemia, unspecified: Secondary | ICD-10-CM | POA: Diagnosis not present

## 2023-10-02 DIAGNOSIS — I509 Heart failure, unspecified: Secondary | ICD-10-CM | POA: Diagnosis not present

## 2023-10-02 DIAGNOSIS — Z9181 History of falling: Secondary | ICD-10-CM | POA: Diagnosis not present

## 2023-10-02 LAB — CBC
HCT: 29.1 % — ABNORMAL LOW (ref 35.0–45.0)
Hemoglobin: 9 g/dL — ABNORMAL LOW (ref 11.7–15.5)
MCH: 30.2 pg (ref 27.0–33.0)
MCHC: 30.9 g/dL — ABNORMAL LOW (ref 32.0–36.0)
MCV: 97.7 fL (ref 80.0–100.0)
MPV: 11.8 fL (ref 7.5–12.5)
Platelets: 209 Thousand/uL (ref 140–400)
RBC: 2.98 Million/uL — ABNORMAL LOW (ref 3.80–5.10)
RDW: 15.2 % — ABNORMAL HIGH (ref 11.0–15.0)
WBC: 6.9 Thousand/uL (ref 3.8–10.8)

## 2023-10-02 LAB — COMPREHENSIVE METABOLIC PANEL WITH GFR
AG Ratio: 1.3 (calc) (ref 1.0–2.5)
ALT: 30 U/L — ABNORMAL HIGH (ref 6–29)
AST: 37 U/L — ABNORMAL HIGH (ref 10–35)
Albumin: 3.5 g/dL — ABNORMAL LOW (ref 3.6–5.1)
Alkaline phosphatase (APISO): 55 U/L (ref 37–153)
BUN/Creatinine Ratio: 16 (calc) (ref 6–22)
BUN: 19 mg/dL (ref 7–25)
CO2: 26 mmol/L (ref 20–32)
Calcium: 9.3 mg/dL (ref 8.6–10.4)
Chloride: 107 mmol/L (ref 98–110)
Creat: 1.17 mg/dL — ABNORMAL HIGH (ref 0.60–1.00)
Globulin: 2.7 g/dL (ref 1.9–3.7)
Glucose, Bld: 136 mg/dL — ABNORMAL HIGH (ref 65–99)
Potassium: 3.9 mmol/L (ref 3.5–5.3)
Sodium: 143 mmol/L (ref 135–146)
Total Bilirubin: 0.3 mg/dL (ref 0.2–1.2)
Total Protein: 6.2 g/dL (ref 6.1–8.1)
eGFR: 49 mL/min/1.73m2 — ABNORMAL LOW (ref >=60)

## 2023-10-03 ENCOUNTER — Other Ambulatory Visit: Payer: Self-pay

## 2023-10-03 ENCOUNTER — Telehealth: Payer: Self-pay | Admitting: Family Medicine

## 2023-10-03 DIAGNOSIS — E782 Mixed hyperlipidemia: Secondary | ICD-10-CM

## 2023-10-03 DIAGNOSIS — Z7409 Other reduced mobility: Secondary | ICD-10-CM | POA: Diagnosis not present

## 2023-10-03 DIAGNOSIS — M6281 Muscle weakness (generalized): Secondary | ICD-10-CM | POA: Diagnosis not present

## 2023-10-03 DIAGNOSIS — R262 Difficulty in walking, not elsewhere classified: Secondary | ICD-10-CM | POA: Diagnosis not present

## 2023-10-03 DIAGNOSIS — E119 Type 2 diabetes mellitus without complications: Secondary | ICD-10-CM | POA: Diagnosis not present

## 2023-10-03 DIAGNOSIS — Z9981 Dependence on supplemental oxygen: Secondary | ICD-10-CM | POA: Diagnosis not present

## 2023-10-03 DIAGNOSIS — J189 Pneumonia, unspecified organism: Secondary | ICD-10-CM | POA: Diagnosis not present

## 2023-10-03 DIAGNOSIS — R7881 Bacteremia: Secondary | ICD-10-CM | POA: Diagnosis not present

## 2023-10-03 MED ORDER — ATORVASTATIN CALCIUM 80 MG PO TABS: 80.0000 mg | ORAL_TABLET | Freq: Every evening | ORAL | 1 refills | Status: AC

## 2023-10-03 NOTE — Telephone Encounter (Signed)
 Prescription Request  10/03/2023  LOV: 06/03/2023  What is the name of the medication or equipment?   atorvastatin  (LIPITOR) 80 MG tablet  **90 day script requested**  Have you contacted your pharmacy to request a refill? Yes   Which pharmacy would you like this sent to?  [id number] [place], [place] - [address] [place] [address] Phone: [phone] Fax: [phone]    Patient notified that their request is being sent to the clinical staff for review and that they should receive a response within 2 business days.   Please advise pharmacist.

## 2023-10-04 ENCOUNTER — Encounter: Payer: Self-pay | Admitting: Internal Medicine

## 2023-10-04 ENCOUNTER — Telehealth: Payer: Self-pay

## 2023-10-04 ENCOUNTER — Telehealth: Payer: Self-pay | Admitting: Allergy and Immunology

## 2023-10-04 NOTE — Telephone Encounter (Signed)
 Ebony Scott called and needs someone from the clinical staff to please call her back regarding her medications. She is unsure about which one she is supposed to be taking.

## 2023-10-04 NOTE — Telephone Encounter (Signed)
 I called the patient back and per Dr. Rowan note patient is to continue to Treat laryngopharyngeal reflux with:                 A. Dexilant  60 mg in the morning             B. famotidine  40 mg daily

## 2023-10-04 NOTE — Transitions of Care (Post Inpatient/ED Visit) (Signed)
 10/04/2023  Name: Ebony Scott MRN: [id number] DOB: November 17, 1950  Today's TOC FU Call Status: Today's TOC FU Call Status:: Successful TOC FU Call Completed TOC FU Call Complete Date: 10/04/23 Patient's Name and Date of Birth confirmed.  Transition Care Management Follow-up Telephone Call Date of Discharge: 10/03/23 Discharge Facility: Other [facility]) Name of Other (Non[facility]) Discharge Facility: [facility] Type of Discharge: Inpatient Admission Primary Inpatient Discharge Diagnosis:: weakness How have you been since you were released from the hospital?: Better Any questions or concerns?: No  Items Reviewed: Did you receive and understand the discharge instructions provided?: Yes Medications obtained,verified, and reconciled?: Yes (Medications Reviewed) Any new allergies since your discharge?: No Dietary orders reviewed?: Yes Do you have support at home?: Yes Name of Support/Comfort Primary Source: caregiver  Medications Reviewed Today: Medications Reviewed Today     Reviewed by Emmitt Pan, LPN (Licensed Practical Nurse) on 10/04/23 at [phone]  Med List Status: <None>   Medication Order Taking? Sig Documenting Provider Last Dose Status Informant  acetaminophen  (TYLENOL ) 500 MG tablet [id number] Yes Take 1 tablet (500 mg total) by mouth every 6 (six) hours as needed for moderate pain (pain score 4-6) or mild pain (pain score 1-3). Caleen Burgess BROCKS, MD  Active   albuterol  (PROVENTIL ) (2.5 MG/3ML) 0.083% nebulizer solution [id number] Yes Take 3 mLs (2.5 mg total) by nebulization every 4 (four) hours as needed for wheezing or shortness of breath. Kozlow, Camellia PARAS, MD  Active Child, Pharmacy Records           Med Note LORNE, CHLOE C   Sat May 18, 2023  2:54 PM)    albuterol  (VENTOLIN  HFA) 108 [phone] Base) MCG/ACT inhaler [id number] Yes Inhale 2 puffs into the lungs every 6 (six) hours as needed for wheezing or shortness of breath. Kozlow, Eric J, MD  Active Child, Pharmacy Records   allopurinol  (ZYLOPRIM ) 100 MG tablet [id number] Yes Take 1 tablet (100 mg total) by mouth daily. [name], MD  Active Child, Pharmacy Records  Alpha-D-Galactosidase [facility] PO) [id number] Yes Take 2 tablets by mouth in the morning, at noon, and at bedtime. [provider]  Active Child, Pharmacy Records  Alpha-Lipoic Acid 600 MG TABS [id number] Yes Take 600 mg by mouth in the morning. [provider]  Active Child, Pharmacy Records  atorvastatin  (LIPITOR) 80 MG tablet [id number] Yes Take 1 tablet (80 mg total) by mouth every evening. [name], MD  Active   Azelastine  HCl 137 MCG/SPRAY SOLN [id number] Yes 2 sprays once per day Aletha Bene, MD  Active Child, Pharmacy Records  Calcium -Magnesium -Vitamin D  (CITRACAL CALCIUM +D) 600-40-500 MG-MG-UNIT TB24 [id number] Yes Take 1 tablet by mouth daily. [provider]  Active Child, Pharmacy Records  cetirizine  (ZYRTEC ) 10 MG tablet [id number] Yes Take 1 tablet (10 mg total) by mouth daily as needed for allergies (Can take a n extra dose during flare ups.). Kozlow, Camellia PARAS, MD  Active Child, Pharmacy Records  Cholecalciferol  (VITAMIN D3) 25 MCG (1000 UT) capsule [id number] Yes Take 1,000 Units by mouth at bedtime. [provider]  Active Child, Pharmacy Records  denosumab  (PROLIA ) 60 MG/ML SOSY injection [id number] Yes Inject 60 mg into the skin every 6 (six) months. [provider]  Active Child, Pharmacy Records           Med Note (CRUTHIS, CHLOE C   Sat May 18, 2023  2:54 PM)    dexlansoprazole  (DEXILANT ) 60 MG capsule [id number] Yes Take 1 capsule (60 mg total) by mouth  in the morning. Kozlow, Camellia PARAS, MD  Active Child, Pharmacy Records  diclofenac  Sodium (VOLTAREN ) 1 % GEL [id number] Yes Apply 1 Application topically as needed (pain). [provider]  Active Child, Pharmacy Records           Med Note [facility] [place], [place] A   Fri Mar 29, 2023  9:04 PM)    empagliflozin  (JARDIANCE ) 10 MG  TABS tablet [id number] Yes Take 1 tablet (10 mg total) by mouth daily. [name], MD  Active Child, Pharmacy Records  ENTRESTO  24-26 [facility] [id number] Yes TAKE 1 TABLET BY MOUTH TWICE A DAY Lonni Slain, MD  Active Child, Pharmacy Records  EPINEPHRINE  0.3 mg/0.3 mL IJ SOAJ injection [id number] Yes USE AS DIRECTED BY YOUR PHYSICIAN INTRAMUSCULARLY AS NEEDED FOR ANAPHYLAXIS Kozlow, Camellia PARAS, MD  Active Child, Pharmacy Records           Med Note LORNE, SHEFFIELD JAYSON [place] Aug 07, 2023 12:32 PM)    famotidine  (PEPCID ) 20 MG tablet [id number] Yes TAKE 1 TABLET (20 MG) BY MOUTH DAILY BEFORE LUNCH AND1 TABLET BY MOUTH EACH NIGHT AT BEDTIME Kozlow, Camellia PARAS, MD  Active Child, Pharmacy Records  fluticasone  (FLONASE ) 50 MCG/ACT nasal spray [id number] Yes Place 2 sprays into both nostrils daily. Aletha Bene, MD  Active Child, Pharmacy Records  Fluticasone -Umeclidin-Vilant (TRELEGY ELLIPTA ) 200-62.5-25 MCG/ACT AEPB [id number] Yes Inhale 1 Inhalation into the lungs in the morning. Kara Dorn NOVAK, MD  Active   folic acid  (FOLVITE ) 1 MG tablet [id number] Yes Take 1 tablet (1 mg total) by mouth daily. [name], MD  Active Child, Pharmacy Records  furosemide  (LASIX ) 40 MG tablet [id number] Yes Take 0.5 tablets (20 mg total) by mouth daily as needed for fluid or edema (Weight gain, edema >3lbs). Monday and Thursday Caleen Burgess JAYSON, MD  Active   gabapentin  (NEURONTIN ) 300 MG capsule [id number] Yes Take 1 capsule (300 mg total) by mouth at bedtime. Patsy Lenis, MD  Active Child, Pharmacy Records           Med Note [facility] [place], [place] A   Fri Mar 29, 2023  9:08 PM)    guaiFENesin  (MUCINEX ) 600 MG 12 hr tablet [id number] Yes Take 600 mg by mouth 2 (two) times daily. [provider]  Active Child, Pharmacy Records  HIZENTRA  10 GM/50ML SOLN [id number] Yes Inject 15 g into the skin once a week. 13 g Infusion Kozlow, Eric J, MD  Active Child, Pharmacy Records  ipratropium-albuterol  (DUONEB) 0.5-2.5  (3) MG/3ML SOLN [id number] Yes Take 3 mLs by nebulization every 6 (six) hours as needed. Kara Dorn NOVAK, MD  Active Child, Pharmacy Records  isosorbide -hydrALAZINE  (BIDIL ) 20-37.5 MG tablet [id number] Yes TAKE 1 TABLET BY MOUTH TWICE A DAY Lonni Slain, MD  Active Child, Pharmacy Records  Lactase (LACTOSE FAST ACTING RELIEF PO) [id number] Yes Take 2 tablets by mouth 3 (three) times daily before meals. [provider]  Active Child, Pharmacy Records  montelukast  (SINGULAIR ) 10 MG tablet [id number] Yes Take 1 tablet (10 mg total) by mouth daily after supper. [name], MD  Active Child, Pharmacy Records  mupirocin  ointment (BACTROBAN ) 2 % [id number] Yes Apply 1 Application topically as needed (wounds). Kozlow, Camellia PARAS, MD  Active Child, Pharmacy Records  nystatin  (MYCOSTATIN ) 100000 UNIT/ML suspension [id number] Yes Swish, gargle, and swallow 5 ml after Trelegy use Perri DELENA Meliton Mickey., MD  Active Child, Pharmacy Records  predniSONE  (DELTASONE ) 5 MG tablet [id number]  Take 1 tablet (5 mg total) by  mouth daily with breakfast.  Patient not taking: Reported on 10/04/2023   Jeannetta Lonni ORN, MD  Active Child, Pharmacy Records  Prenatal Vit-Fe Fumarate-FA (PRENATAL VITAMINS PO) [id number] Yes Take 1 tablet by mouth in the morning. [provider]  Active Child, Pharmacy Records  Probiotic Product (ALIGN) 4 MG CAPS [id number] Yes Take 4 mg by mouth every evening. [provider]  Active Child, Pharmacy Records  Respiratory Therapy Supplies (NEBULIZER MASK ADULT) MISC [id number] Yes 1 Device by Does not apply route as directed. Kozlow, Camellia PARAS, MD  Active Child, Pharmacy Records  rivaroxaban  (XARELTO ) 20 MG TABS tablet [id number] Yes Take 1 tablet (20 mg total) by mouth daily with supper. Caleen Burgess BROCKS, MD  Active   sodium bicarbonate  650 MG tablet [id number] Yes Take 650 mg by mouth daily. [provider]  Active Child, Pharmacy Records  sodium chloride   HYPERTONIC 3 % nebulizer solution [id number] Yes Take 4 mLs by nebulization at bedtime. [provider]  Active Child, Pharmacy Records  traZODone  (DESYREL ) 50 MG tablet [id number] Yes Take 100 mg by mouth at bedtime as needed. [provider]  Active   Med List Note Jeralyn Rockey LABOR, CPhT 01/04/21 1640):              Home Care and Equipment/Supplies: Were Home Health Services Ordered?: Yes Name of Home Health Agency:: unknown Has Agency set up a time to come to your home?: No Any new equipment or medical supplies ordered?: NA  Functional Questionnaire: Do you need assistance with bathing/showering or dressing?: Yes Do you need assistance with meal preparation?: Yes Do you need assistance with eating?: No Do you have difficulty maintaining continence: No Do you need assistance with getting out of bed/getting out of a chair/moving?: No Do you have difficulty managing or taking your medications?: Yes  Follow up appointments reviewed: PCP Follow-up appointment confirmed?: Yes Date of PCP follow-up appointment?: 10/10/23 Follow-up Provider: Edward [place] Follow-up appointment confirmed?: NA Do you need transportation to your follow-up appointment?: No Do you understand care options if your condition(s) worsen?: Yes-patient verbalized understanding    SIGNATURE Julian Lemmings, LPN [facility] Nurse Health Advisor Direct Dial [phone]

## 2023-10-05 DIAGNOSIS — R7881 Bacteremia: Secondary | ICD-10-CM | POA: Diagnosis not present

## 2023-10-05 DIAGNOSIS — M059 Rheumatoid arthritis with rheumatoid factor, unspecified: Secondary | ICD-10-CM | POA: Diagnosis not present

## 2023-10-05 DIAGNOSIS — J4550 Severe persistent asthma, uncomplicated: Secondary | ICD-10-CM | POA: Diagnosis not present

## 2023-10-05 DIAGNOSIS — J9620 Acute and chronic respiratory failure, unspecified whether with hypoxia or hypercapnia: Secondary | ICD-10-CM | POA: Diagnosis not present

## 2023-10-05 DIAGNOSIS — B9689 Other specified bacterial agents as the cause of diseases classified elsewhere: Secondary | ICD-10-CM | POA: Diagnosis not present

## 2023-10-05 DIAGNOSIS — N179 Acute kidney failure, unspecified: Secondary | ICD-10-CM | POA: Diagnosis not present

## 2023-10-05 DIAGNOSIS — J189 Pneumonia, unspecified organism: Secondary | ICD-10-CM | POA: Diagnosis not present

## 2023-10-05 DIAGNOSIS — E1140 Type 2 diabetes mellitus with diabetic neuropathy, unspecified: Secondary | ICD-10-CM | POA: Diagnosis not present

## 2023-10-05 DIAGNOSIS — T827XXD Infection and inflammatory reaction due to other cardiac and vascular devices, implants and grafts, subsequent encounter: Secondary | ICD-10-CM | POA: Diagnosis not present

## 2023-10-09 ENCOUNTER — Telehealth: Payer: Self-pay

## 2023-10-09 DIAGNOSIS — J4550 Severe persistent asthma, uncomplicated: Secondary | ICD-10-CM | POA: Diagnosis not present

## 2023-10-09 DIAGNOSIS — M059 Rheumatoid arthritis with rheumatoid factor, unspecified: Secondary | ICD-10-CM | POA: Diagnosis not present

## 2023-10-09 DIAGNOSIS — E1140 Type 2 diabetes mellitus with diabetic neuropathy, unspecified: Secondary | ICD-10-CM | POA: Diagnosis not present

## 2023-10-09 DIAGNOSIS — B9689 Other specified bacterial agents as the cause of diseases classified elsewhere: Secondary | ICD-10-CM | POA: Diagnosis not present

## 2023-10-09 DIAGNOSIS — N179 Acute kidney failure, unspecified: Secondary | ICD-10-CM | POA: Diagnosis not present

## 2023-10-09 DIAGNOSIS — T827XXD Infection and inflammatory reaction due to other cardiac and vascular devices, implants and grafts, subsequent encounter: Secondary | ICD-10-CM | POA: Diagnosis not present

## 2023-10-09 DIAGNOSIS — J189 Pneumonia, unspecified organism: Secondary | ICD-10-CM | POA: Diagnosis not present

## 2023-10-09 DIAGNOSIS — J9620 Acute and chronic respiratory failure, unspecified whether with hypoxia or hypercapnia: Secondary | ICD-10-CM | POA: Diagnosis not present

## 2023-10-09 DIAGNOSIS — R7881 Bacteremia: Secondary | ICD-10-CM | POA: Diagnosis not present

## 2023-10-09 NOTE — Telephone Encounter (Signed)
 Copied from CRM [id number]. Topic: General - Other >> Oct 09, 2023  1:25 PM Delon DASEN wrote: Reason for CRM: [facility] with [facility] calling about the medications they faxed over, please review with patient and give her a list to take home

## 2023-10-10 ENCOUNTER — Ambulatory Visit (INDEPENDENT_AMBULATORY_CARE_PROVIDER_SITE_OTHER): Admitting: Family Medicine

## 2023-10-10 ENCOUNTER — Telehealth: Payer: Self-pay | Admitting: Family Medicine

## 2023-10-10 ENCOUNTER — Encounter: Payer: Self-pay | Admitting: Family Medicine

## 2023-10-10 VITALS — BP 118/62 | HR 87 | Temp 98.3°F | Ht 60.0 in | Wt 120.4 lb

## 2023-10-10 DIAGNOSIS — I5022 Chronic systolic (congestive) heart failure: Secondary | ICD-10-CM | POA: Diagnosis not present

## 2023-10-10 DIAGNOSIS — D649 Anemia, unspecified: Secondary | ICD-10-CM

## 2023-10-10 DIAGNOSIS — D801 Nonfamilial hypogammaglobulinemia: Secondary | ICD-10-CM

## 2023-10-10 DIAGNOSIS — R7881 Bacteremia: Secondary | ICD-10-CM | POA: Diagnosis not present

## 2023-10-10 DIAGNOSIS — B952 Enterococcus as the cause of diseases classified elsewhere: Secondary | ICD-10-CM

## 2023-10-10 DIAGNOSIS — J449 Chronic obstructive pulmonary disease, unspecified: Secondary | ICD-10-CM | POA: Diagnosis not present

## 2023-10-10 NOTE — Progress Notes (Signed)
 Subjective:    Patient ID: Ebony Scott, female    DOB: [birth year], 73 y.o.   MRN: [id number] Admit date: 08/06/2023 Discharge date: 08/22/2023   Admitted From: Home Disposition: SNF   Recommendations for Outpatient Follow-up:  Follow up with PCP in 1-2 weeks Please obtain BMP/CBC in one week your next doctors visit.  Lasix  20 mg daily as needed for weight gain or edema greater than 3 pounds Ampicillin  and Rocephin  ordered, end of treatment 09/19/2023 Patient is no longer on Plavix .  Restarted Xarelto  on August 26, 2023     Discharge Condition: Stable CODE STATUS: DNR Diet recommendation: Heart healthy   Brief/Interim Summary: Brief Narrative:     73 y.o. 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  She is admitted for CIED associated E faecalis bacteremia, community acquired pneumonia, acute respiratory failure and AKI.  ID following.  ICD extracted on 7/2.  Plan for Rocephin  2 g every 12 hours until 09/19/2023.   Patient is no longer on Plavix .  Xarelto  is to be resumed on August 26, 2023.  Continue antibiotics as mentioned above.  Will need outpatient follow-up with cardiology and PCP.   Assessment & Plan:  Principal Problem:   Community acquired pneumonia Active Problems:   Chronic systolic heart failure (HCC)   Generalized weakness   AKI (acute kidney injury) (HCC)   Enterococcus faecalis infection   Enterococcal bacteremia    CIED associated Enterococcus faecalis bacteremia Seen by ID.  ICD extracted 7/2 - Will need  antibiotics ampicillin  8 g continuous infusion plus Rocephin  2 g every 12 hours until 09/19/2023.   Initial blood culture 6/17-positive for Enterococcus faecalis Repeat blood culture 6/19-NGTD TEE 6/23-no thrombus or vegetation seen     Sepsis due to community-acquired pneumonia/recurrent pneumonitis Currently on antibiotic therapy   Acute kidney injury on stage IIIb chronic kidney disease Baseline creatinine reportedly 1.3-1.5.  Creatinine peaked 2.10, now back to baseline - Continue Jardiance , Entresto    Acute on chronic respiratory failure with hypoxia/bronchiectasis At baseline patient reportedly uses [place] oxygen  2 to 3 L/min at night. Acute respiratory failure has resolved.  May have to do home O2 evaluation at time of discharge.   Ischemic cardiomyopathy/chronic HFrEF/s/p ICD ef 25% Overall appears euvolemic.  Lasix  and BiDil  are currently on hold Continue Entresto  and Jardiance  ICD extracted 7/2   CAD s/p DES to LAD Lipitor..  Plavix  discontinued by cardiology 6/27.   History of PE/DVT Management of Xarelto  as noted above   Hypogammaglobulinemia On IVIG as outpatient. Follows with Dr. Maurilio, Allergist.  Patient has missed a couple doses of the IVIG while hospitalized which she has to get weekly.  As discussed with daughter, has an appointment on Wednesday of next week to see him and can maybe get it at that time.   Seropositive rheumatoid arthritis/chronic steroid use Back on prednisone  5 Mg daily.  Follow-up outpatient rheumatology   Type II DM, controlled A1c 6.5 on 6/18 CBGs well-controlled, discontinued SSI.   Chronic pain syndrome Continue gabapentin    Hypokalemia Replaced.   Generalized weakness/physical deconditioning PT/OT   Chronic cough Reports  that this has been going on for maybe a year and is unchanged. Initiated Tessalon  Perles and Robitussin DM as needed.  Monitor.  Improved cough.  10/10/23 Patient is here today with her daughter.  She is  taking Xarelto  for prevention of pulmonary embolism.  She stopped Plavix .  She has been released by infectious disease having completed ampicillin  and Rocephin .  PICC line was removed on August 13.  She has also seen her cardiologist.  They have decided not to reimplant defibrillator due to the recent infection.  Patient is currently on Entresto , BiDil , and Jardiance  for congestive heart failure.  Her blood pressure is extremely soft but she denies lightheadedness or syncope or near syncope.  Her hemoglobin A1c was excellent prior to admission to the hospital.  Her kidney function on recent lab work August 12 was back to her baseline.  She is using Trelegy daily for COPD but she is not using her incentive spirometer or her flutter valve.  She is no longer having to use tramadol  for pain or take trazodone  to help her sleep.  I discussed this with her daughter.  Due to confusion and age the less medication she better off she would be.  I would like her to avoid centrally acting medication that can make her drowsy or confused.  She has anemia of chronic disease.  Her baseline creatinine has been between 9 and 10 for the last 3 years.  Recently it was 9.  She is not currently taking any iron  or B12 Past Medical History:  Diagnosis Date   Allergy     Anemia    Angio-edema    Breast cancer (HCC) 1998   Left breast, in remission   Bronchiectasis (HCC)    Cataract    CHF (congestive heart failure) (HCC)    Closed displaced fracture of metatarsal bone of right foot 01/20/2019   Closed fracture of fifth metatarsal bone of right foot, initial encounter 01/15/2018   Clostridium difficile colitis 01/14/2018   Coronary artery disease    Depression    some; don't take anything for it (02/20/2012), pt denies dx as of 06/02/21   Diverticulosis    DVT (deep venous thrombosis) (HCC)    Fibromyalgia 11/2011   Fracture of base of fifth metatarsal bone with routine healing, left    GAVE (gastric antral vascular  ectasia)    GERD (gastroesophageal reflux disease)    Headache(784.0)    related to allergies; more at different times during the year (02/20/2012)   Hemorrhoids    Hiatal hernia    back and neck   History of blood transfusion 2023   1 unit, 5 units of Iron    Hx of adenomatous colonic polyps 04/12/2016   Hypercholesteremia    good cholesterol is high   Hypothyroidism    h/o Graves disease   IBS (irritable bowel syndrome)    Moderate persistent asthma    -FeV1 72% 2011, -IgE 102 2011, CT sinus Neg 2011   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 Cindy Setter   Sleep apnea    uses cpap nightly   Tracheobronchomalacia    Vasculitis (HCC) 12/13/2017   Past Surgical History:  Procedure Laterality Date   ABDOMINAL HYSTERECTOMY N/A  Phreesia 10/21/2019   ANTERIOR AND POSTERIOR REPAIR  1990's   APPENDECTOMY     ARGON LASER APPLICATION  02/05/2021   Procedure: ARGON LASER APPLICATION;  Surgeon: Leigh Elspeth SQUIBB, MD;  Location: [facility];  Service: Gastroenterology;;   BREAST LUMPECTOMY  1998   left   BREAST SURGERY N/A    Phreesia 10/21/2019   BRONCHIAL WASHINGS  04/05/2020   Procedure: BRONCHIAL WASHINGS;  Surgeon: Kara Dorn NOVAK, MD;  Location: [facility];  Service: Pulmonary;;   CAPSULOTOMY Right 08/28/2021   Procedure: CAPSULOTOMY;  Surgeon: Tobie Franky SQUIBB, DPM;  Location: [facility];  Service: Podiatry;  Laterality: Right;   CARPOMETACARPEL (CMC) FUSION OF THUMB WITH AUTOGRAFT FROM RADIUS  ~ 2009   both thumbs (02/20/2012)   CATARACT EXTRACTION W/ INTRAOCULAR LENS  IMPLANT, BILATERAL  2012   CERVICAL DISCECTOMY  10/2001   C5-C6   CERVICAL FUSION  2003   C3-C4   CHOLECYSTECTOMY     COLONOSCOPY     CORONARY STENT INTERVENTION N/A  01/11/2021   Procedure: CORONARY STENT INTERVENTION;  Surgeon: Anner Alm ORN, MD;  Location: [facility] INVASIVE CV LAB;  Service: Cardiovascular;  Laterality: N/A;   DEBRIDEMENT TENNIS ELBOW  ?1970's   right   ESOPHAGOGASTRODUODENOSCOPY     ESOPHAGOGASTRODUODENOSCOPY (EGD) WITH PROPOFOL  N/A 02/05/2021   Procedure: ESOPHAGOGASTRODUODENOSCOPY (EGD) WITH PROPOFOL ;  Surgeon: Leigh Elspeth SQUIBB, MD;  Location: [facility];  Service: Gastroenterology;  Laterality: N/A;   EYE SURGERY N/A    Phreesia 10/21/2019   HAMMER TOE SURGERY Right 08/28/2021   Procedure: HAMMER TOE REPAIR 2ND TOE RIGHT FOOT;  Surgeon: Tobie Franky SQUIBB, DPM;  Location: [facility];  Service: Podiatry;  Laterality: Right;   HYSTERECTOMY     I & D EXTREMITY Right 09/22/2020   Procedure: IRRIGATION AND DEBRIDEMENT RIGHT HAND AND ELBOW;  Surgeon: Josefina Chew, MD;  Location: [facility];  Service: Orthopedics;  Laterality: Right;   ICD GENERATOR REMOVAL N/A 08/21/2023   Procedure: ICD GENERATOR REMOVAL;  Surgeon: Waddell Danelle ORN, MD;  Location: [facility] INVASIVE CV LAB;  Service: Cardiovascular;  Laterality: N/A;   ICD IMPLANT N/A 08/27/2022   Procedure: ICD IMPLANT;  Surgeon: Nancey Eulas BRAVO, MD;  Location: [facility] LAB;  Service: Cardiovascular;  Laterality: N/A;   JOINT REPLACEMENT  02/15/2021   KNEE ARTHROPLASTY  ?8009'd   ?right; w/cartilage repair (02/20/2012)   LEAD EXTRACTION N/A 08/21/2023   Procedure: LEAD EXTRACTION;  Surgeon: Waddell Danelle ORN, MD;  [facility]: [facility];  Service: Cardiovascular;  Laterality: N/A;   MASS EXCISION Left 08/28/2021   Procedure: EXCISION OF SOFT TISSUE  MASS LEFT FOOT;  Surgeon: Tobie Franky SQUIBB, DPM;  Location: [facility];  Service: Podiatry;  Laterality: Left;   NASAL SEPTUM SURGERY  1980's   POSTERIOR CERVICAL FUSION/FORAMINOTOMY  2004   failed initial fusion; rewired  anterior neck (02/20/2012)   REVERSE SHOULDER ARTHROPLASTY Right 02/16/2020   Procedure: REVERSE SHOULDER ARTHROPLASTY;   Surgeon: Josefina Chew, MD;  Location: [facility];  Service: Orthopedics;  Laterality: Right;   RIGHT/LEFT HEART CATH AND CORONARY ANGIOGRAPHY N/A 01/11/2021   Procedure: RIGHT/LEFT HEART CATH AND CORONARY ANGIOGRAPHY;  Surgeon: Anner Alm ORN, MD;  Location: [facility] INVASIVE CV LAB;  Service: Cardiovascular;  Laterality: N/A;   SPINE SURGERY N/A    Phreesia 10/21/2019   TONSILLECTOMY  ~ 1953   TRANSESOPHAGEAL ECHOCARDIOGRAM (CATH LAB) N/A 08/12/2023   Procedure: TRANSESOPHAGEAL ECHOCARDIOGRAM;  Surgeon: Pietro Redell RAMAN, MD;  Location: [facility] INVASIVE CV LAB;  Service:  Cardiovascular;  Laterality: N/A;   VESICOVAGINAL FISTULA CLOSURE W/ TAH  1988   VIDEO BRONCHOSCOPY Bilateral 08/23/2016   Procedure: VIDEO BRONCHOSCOPY WITH FLUORO;  Surgeon: Noreen Tonnie BRAVO, MD;  Location: [facility];  Service: Cardiopulmonary;  Laterality: Bilateral;   VIDEO BRONCHOSCOPY N/A 04/05/2020   Procedure: VIDEO BRONCHOSCOPY WITHOUT FLUORO;  Surgeon: Kara Dorn NOVAK, MD;  Location: [facility];  Service: Pulmonary;  Laterality: N/A;   Current Outpatient Medications on File Prior to Visit  Medication Sig Dispense Refill   acetaminophen  (TYLENOL ) 500 MG tablet Take 1 tablet (500 mg total) by mouth every 6 (six) hours as needed for moderate pain (pain score 4-6) or mild pain (pain score 1-3).     albuterol  (PROVENTIL ) (2.5 MG/3ML) 0.083% nebulizer solution Take 3 mLs (2.5 mg total) by nebulization every 4 (four) hours as needed for wheezing or shortness of breath. 150 mL 1   albuterol  (VENTOLIN  HFA) 108 (90 Base) MCG/ACT inhaler Inhale 2 puffs into the lungs every 6 (six) hours as needed for wheezing or shortness of breath. 18 g 1   allopurinol  (ZYLOPRIM ) 100 MG tablet Take 1 tablet (100 mg total) by mouth daily. 90 tablet 0   Alpha-D-Galactosidase (BEANO PO) Take 2 tablets by mouth in the morning, at noon, and at bedtime.     Alpha-Lipoic Acid 600 MG TABS Take 600 mg by mouth in the morning.     atorvastatin  (LIPITOR)  80 MG tablet Take 1 tablet (80 mg total) by mouth every evening. 90 tablet 1   Azelastine  HCl 137 MCG/SPRAY SOLN 2 sprays once per day 30 mL 2   Calcium -Magnesium -Vitamin D  (CITRACAL CALCIUM +D) 600-40-500 MG-MG-UNIT TB24 Take 1 tablet by mouth daily.     cetirizine  (ZYRTEC ) 10 MG tablet Take 1 tablet (10 mg total) by mouth daily as needed for allergies (Can take a n extra dose during flare ups.). 180 tablet 1   Cholecalciferol  (VITAMIN D3) 25 MCG (1000 UT) capsule Take 1,000 Units by mouth at bedtime.     denosumab  (PROLIA ) 60 MG/ML SOSY injection Inject 60 mg into the skin every 6 (six) months.     dexlansoprazole  (DEXILANT ) 60 MG capsule Take 1 capsule (60 mg total) by mouth in the morning. 90 capsule 1   diclofenac  Sodium (VOLTAREN ) 1 % GEL Apply 1 Application topically as needed (pain).     empagliflozin  (JARDIANCE ) 10 MG TABS tablet Take 1 tablet (10 mg total) by mouth daily. 90 tablet 1   ENTRESTO  24-26 MG TAKE 1 TABLET BY MOUTH TWICE A DAY 180 tablet 0   EPINEPHRINE  0.3 mg/0.3 mL IJ SOAJ injection USE AS DIRECTED BY YOUR PHYSICIAN INTRAMUSCULARLY AS NEEDED FOR ANAPHYLAXIS 2 each 1   famotidine  (PEPCID ) 20 MG tablet TAKE 1 TABLET (20 MG) BY MOUTH DAILY BEFORE LUNCH AND1 TABLET BY MOUTH EACH NIGHT AT BEDTIME 180 tablet 1   fluticasone  (FLONASE ) 50 MCG/ACT nasal spray Place 2 sprays into both nostrils daily. 16 g 6   Fluticasone -Umeclidin-Vilant (TRELEGY ELLIPTA ) 200-62.5-25 MCG/ACT AEPB Inhale 1 Inhalation into the lungs in the morning. 120 each 3   folic acid  (FOLVITE ) 1 MG tablet Take 1 tablet (1 mg total) by mouth daily. 90 tablet 3   furosemide  (LASIX ) 40 MG tablet Take 0.5 tablets (20 mg total) by mouth daily as needed for fluid or edema (Weight gain, edema >3lbs). Monday and Thursday     gabapentin  (NEURONTIN ) 300 MG capsule Take 1 capsule (300 mg total) by mouth at bedtime.     guaiFENesin  (  MUCINEX ) 600 MG 12 hr tablet Take 600 mg by mouth 2 (two) times daily.     HIZENTRA  10 GM/50ML  SOLN Inject 15 g into the skin once a week. 13 g Infusion 300 mL 11   ipratropium-albuterol  (DUONEB) 0.5-2.5 (3) MG/3ML SOLN Take 3 mLs by nebulization every 6 (six) hours as needed. 360 mL 6   isosorbide -hydrALAZINE  (BIDIL ) 20-37.5 MG tablet TAKE 1 TABLET BY MOUTH TWICE A DAY 180 tablet 0   Lactase (LACTOSE FAST ACTING RELIEF PO) Take 2 tablets by mouth 3 (three) times daily before meals.     montelukast  (SINGULAIR ) 10 MG tablet Take 1 tablet (10 mg total) by mouth daily after supper. 90 tablet 1   mupirocin  ointment (BACTROBAN ) 2 % Apply 1 Application topically as needed (wounds). 30 g 3   nystatin  (MYCOSTATIN ) 100000 UNIT/ML suspension Swish, gargle, and swallow 5 ml after Trelegy use 473 mL 0   predniSONE  (DELTASONE ) 5 MG tablet Take 1 tablet (5 mg total) by mouth daily with breakfast. (Patient not taking: Reported on 10/04/2023) 90 tablet 0   Prenatal Vit-Fe Fumarate-FA (PRENATAL VITAMINS PO) Take 1 tablet by mouth in the morning.     Probiotic Product (ALIGN) 4 MG CAPS Take 4 mg by mouth every evening.     Respiratory Therapy Supplies (NEBULIZER MASK ADULT) MISC 1 Device by Does not apply route as directed. 1 each 1   rivaroxaban  (XARELTO ) 20 MG TABS tablet Take 1 tablet (20 mg total) by mouth daily with supper.     sodium bicarbonate  650 MG tablet Take 650 mg by mouth daily.     sodium chloride  HYPERTONIC 3 % nebulizer solution Take 4 mLs by nebulization at bedtime.     traZODone  (DESYREL ) 50 MG tablet Take 100 mg by mouth at bedtime as needed.     No current facility-administered medications on file prior to visit.   Allergies  Allergen Reactions   Baclofen  Anaphylaxis and Other (See Comments)    Altered mental status requiring a 3-day hospital stay- patient became unresponsive   Put me in a coma for five days, Altered mental status requiring a 3-day hospital stay- patient became unresponsive   Other Shortness Of Breath and Other (See Comments)    Grass and weeds sneezing; filled  sinuses (02/20/2012)   Pork-Derived Products Anaphylaxis   Shellfish Allergy  Anaphylaxis, Shortness Of Breath, Itching and Rash   Shrimp Extract Anaphylaxis and Other (See Comments)    ALL SHELLFISH   Tetracycline Hcl Nausea And Vomiting   Xolair  [Omalizumab ] Other (See Comments)    Caused Blood clot   Zoledronic  Acid Other (See Comments)    Reclast - Fever, Put in hospital, dr said it was a reaction from a reaction    Fever, inability to walk, Put in hospital  Fever, Put in hospital, dr said it was a reaction from a reaction , Other reaction(s): Unknown   Celecoxib Swelling    Feet swelling   Dilaudid  [Hydromorphone  Hcl] Itching   Hydrocodone -Acetaminophen  Itching   Levofloxacin  Other (See Comments)    GI upset   Molds & Smuts Cough   Morphine  And Codeine Hives and Itching   Oxycodone  Hcl Nausea And Vomiting    GI Intolerance   Paroxetine Nausea And Vomiting and Other (See Comments)    GI Intolerance also   Tetracycline Hcl Other (See Comments)    GI Intolerance   Oxycodone -Acetaminophen  Itching   Diltiazem  Hcl Swelling   Dust Mite Extract Other (See Comments) and Cough  sneezing (02/20/2012) too   Gamunex [Immune Globulin ] Itching and Rash    Tolerates hizentra     Rituximab  Other (See Comments)    Anemia    Rofecoxib Swelling    Vioxx- feet swelling   Tree Extract Itching    tested and told I was allergic to it; never experienced a reaction to it (02/20/2012)   Social History   Socioeconomic History   Marital status: Divorced    Spouse name: Not on file   Number of children: 2   Years of education: college   Highest education level: Bachelor's degree (e.g., BA, AB, BS)  Occupational History   Occupation: Disabled    Comment: Retired Dispensing optician: RETIRED  Tobacco Use   Smoking status: Never    Passive exposure: Past   Smokeless tobacco: Never   Tobacco comments:    Parents  Vaping Use   Vaping status: Never Used  Substance  and Sexual Activity   Alcohol  use: Not Currently    Alcohol /week: 0.0 standard drinks of alcohol    Drug use: No   Sexual activity: Not Currently    Birth control/protection: Surgical    Comment: Hysterectomy  Other Topics Concern   Not on file  Social History Narrative   Patient lives at home alone. Patient  divorced.    Patient has her BS degree.   Right handed.   Caffeine- sometimes coffee.      [place] [facility]:   Born in [place], [place]. She worked as a Armed forces operational officer. She has no pets currently. She does have indoor plants. Previously had mold in her home that was remediated. Carpet was removed.          Social Drivers of Corporate investment banker Strain: Low Risk  (03/26/2023)   Overall Financial Resource Strain (CARDIA)    Difficulty of Paying Living Expenses: Not very hard  Food Insecurity: No Food Insecurity (08/07/2023)   Hunger Vital Sign    Worried About Running Out of Food in the Last Year: Never true    Ran Out of Food in the Last Year: Never true  Transportation Needs: No Transportation Needs (08/07/2023)   PRAPARE - Administrator, Civil Service (Medical): No    Lack of Transportation (Non-Medical): No  Physical Activity: Inactive (03/26/2023)   Exercise Vital Sign    Days of Exercise per Week: 0 days    Minutes of Exercise per Session: 30 min  Stress: Stress Concern Present (03/26/2023)   [employer] of Occupational Health - Occupational Stress Questionnaire    Feeling of Stress : To some extent  Social Connections: Socially Isolated (08/07/2023)   Social Connection and Isolation Panel    Frequency of Communication with Friends and Family: Never    Frequency of Social Gatherings with Friends and Family: Never    Attends Religious Services: Never    Database administrator or Organizations: No    Attends Banker Meetings: Never    Marital Status: Divorced  Catering manager Violence: Not At Risk (08/07/2023)   Humiliation, Afraid,  Rape, and Kick questionnaire    Fear of Current or Ex-Partner: No    Emotionally Abused: No    Physically Abused: No    Sexually Abused: No     Review of Systems     Objective:   Physical Exam Vitals reviewed.  Constitutional:      General: She is not in acute distress.    Appearance: Normal  appearance. She is not ill-appearing or toxic-appearing.  HENT:     Nose: No congestion or rhinorrhea.  Cardiovascular:     Rate and Rhythm: Normal rate and regular rhythm.     Heart sounds: Normal heart sounds. No murmur heard. Pulmonary:     Effort: Pulmonary effort is normal. No respiratory distress.     Breath sounds: No stridor or decreased air movement. No wheezing, rhonchi or rales.  Abdominal:     General: Bowel sounds are normal. There is no distension.     Palpations: Abdomen is soft.     Tenderness: There is no abdominal tenderness. There is no guarding.  Musculoskeletal:     Right lower leg: No edema.     Left lower leg: No edema.  Skin:    Findings: No erythema.  Neurological:     Mental Status: She is alert and oriented to person, place, and time.     Cranial Nerves: No cranial nerve deficit.     Sensory: No sensory deficit.     Motor: No weakness.     Coordination: Coordination normal.        Assessment & Plan:  Chronic systolic CHF (congestive heart failure) (HCC)  Hypogammaglobulinemia (HCC)  Anemia, unspecified type  Chronic obstructive pulmonary disease, unspecified COPD type (HCC)  Enterococcal bacteremia From the standpoint of her Enterococcus faecalis bacteremia, the patient has completed almost 2 months of antibiotics and no longer has a PICC line.  She has been released by infectious disease and her most recent CBC showed a normal white blood cell count.  She denies any fevers or chills.  From the standpoint of her congestive heart failure she has declined reimplantation of the defibrillator.  She is on maximum goal-directed medical therapy.  Her blood  pressure could not tolerate a beta-blocker at this point.  From the standpoint of her COPD she is on triple therapy.  I encouraged her to use incentive spirometer and flutter valve daily to help reduce her risk of pneumonia.  From the standpoint of her anemia she is at her baseline.  She is currently on Xarelto  for prevention of DVT.  I recommend rechecking lab work including a CBC and CMP in 3 months or sooner follow-up if symptoms arise

## 2023-10-10 NOTE — Telephone Encounter (Signed)
 FMLA paperwork will be faxed shortly from [employer]. Patient prepaid form completion fee.   Requesting for completed paperwork to be faxed to [employer] and to patient's home fax number of [phone].  Paperwork will be placed on nurse's desk when received via efax.

## 2023-10-11 ENCOUNTER — Encounter (HOSPITAL_BASED_OUTPATIENT_CLINIC_OR_DEPARTMENT_OTHER): Payer: Self-pay

## 2023-10-14 ENCOUNTER — Telehealth: Payer: Self-pay

## 2023-10-14 ENCOUNTER — Emergency Department (HOSPITAL_COMMUNITY)

## 2023-10-14 ENCOUNTER — Other Ambulatory Visit: Payer: Self-pay

## 2023-10-14 ENCOUNTER — Ambulatory Visit: Payer: Self-pay

## 2023-10-14 ENCOUNTER — Emergency Department (HOSPITAL_COMMUNITY)
Admission: EM | Admit: 2023-10-14 | Discharge: 2023-10-14 | Disposition: A | Discharge status: Home / Self Care | Attending: Emergency Medicine | Admitting: Emergency Medicine

## 2023-10-14 DIAGNOSIS — E039 Hypothyroidism, unspecified: Secondary | ICD-10-CM | POA: Diagnosis not present

## 2023-10-14 DIAGNOSIS — I509 Heart failure, unspecified: Secondary | ICD-10-CM | POA: Diagnosis not present

## 2023-10-14 DIAGNOSIS — S0012XA Contusion of left eyelid and periocular area, initial encounter: Secondary | ICD-10-CM | POA: Diagnosis not present

## 2023-10-14 DIAGNOSIS — S0083XA Contusion of other part of head, initial encounter: Secondary | ICD-10-CM | POA: Diagnosis not present

## 2023-10-14 DIAGNOSIS — B9689 Other specified bacterial agents as the cause of diseases classified elsewhere: Secondary | ICD-10-CM | POA: Diagnosis not present

## 2023-10-14 DIAGNOSIS — I2510 Atherosclerotic heart disease of native coronary artery without angina pectoris: Secondary | ICD-10-CM | POA: Diagnosis not present

## 2023-10-14 DIAGNOSIS — M778 Other enthesopathies, not elsewhere classified: Secondary | ICD-10-CM | POA: Diagnosis not present

## 2023-10-14 DIAGNOSIS — J9620 Acute and chronic respiratory failure, unspecified whether with hypoxia or hypercapnia: Secondary | ICD-10-CM | POA: Diagnosis not present

## 2023-10-14 DIAGNOSIS — Z853 Personal history of malignant neoplasm of breast: Secondary | ICD-10-CM | POA: Diagnosis not present

## 2023-10-14 DIAGNOSIS — M19042 Primary osteoarthritis, left hand: Secondary | ICD-10-CM | POA: Diagnosis not present

## 2023-10-14 DIAGNOSIS — T827XXD Infection and inflammatory reaction due to other cardiac and vascular devices, implants and grafts, subsequent encounter: Secondary | ICD-10-CM | POA: Diagnosis not present

## 2023-10-14 DIAGNOSIS — S60212A Contusion of left wrist, initial encounter: Secondary | ICD-10-CM | POA: Insufficient documentation

## 2023-10-14 DIAGNOSIS — N179 Acute kidney failure, unspecified: Secondary | ICD-10-CM | POA: Diagnosis not present

## 2023-10-14 DIAGNOSIS — S0033XA Contusion of nose, initial encounter: Secondary | ICD-10-CM | POA: Diagnosis not present

## 2023-10-14 DIAGNOSIS — M25519 Pain in unspecified shoulder: Secondary | ICD-10-CM | POA: Diagnosis not present

## 2023-10-14 DIAGNOSIS — J4550 Severe persistent asthma, uncomplicated: Secondary | ICD-10-CM | POA: Diagnosis not present

## 2023-10-14 DIAGNOSIS — H5712 Ocular pain, left eye: Secondary | ICD-10-CM | POA: Diagnosis not present

## 2023-10-14 DIAGNOSIS — S43002A Unspecified subluxation of left shoulder joint, initial encounter: Secondary | ICD-10-CM | POA: Diagnosis not present

## 2023-10-14 DIAGNOSIS — M25532 Pain in left wrist: Secondary | ICD-10-CM | POA: Diagnosis not present

## 2023-10-14 DIAGNOSIS — M25522 Pain in left elbow: Secondary | ICD-10-CM | POA: Diagnosis not present

## 2023-10-14 DIAGNOSIS — Z7902 Long term (current) use of antithrombotics/antiplatelets: Secondary | ICD-10-CM | POA: Insufficient documentation

## 2023-10-14 DIAGNOSIS — Z981 Arthrodesis status: Secondary | ICD-10-CM | POA: Diagnosis not present

## 2023-10-14 DIAGNOSIS — M4312 Spondylolisthesis, cervical region: Secondary | ICD-10-CM | POA: Diagnosis not present

## 2023-10-14 DIAGNOSIS — M059 Rheumatoid arthritis with rheumatoid factor, unspecified: Secondary | ICD-10-CM | POA: Diagnosis not present

## 2023-10-14 DIAGNOSIS — R7881 Bacteremia: Secondary | ICD-10-CM | POA: Diagnosis not present

## 2023-10-14 DIAGNOSIS — W01198A Fall on same level from slipping, tripping and stumbling with subsequent striking against other object, initial encounter: Secondary | ICD-10-CM | POA: Diagnosis not present

## 2023-10-14 DIAGNOSIS — M542 Cervicalgia: Secondary | ICD-10-CM | POA: Diagnosis not present

## 2023-10-14 DIAGNOSIS — M19012 Primary osteoarthritis, left shoulder: Secondary | ICD-10-CM | POA: Diagnosis not present

## 2023-10-14 DIAGNOSIS — J189 Pneumonia, unspecified organism: Secondary | ICD-10-CM | POA: Diagnosis not present

## 2023-10-14 DIAGNOSIS — S0993XA Unspecified injury of face, initial encounter: Secondary | ICD-10-CM | POA: Diagnosis present

## 2023-10-14 DIAGNOSIS — R220 Localized swelling, mass and lump, head: Secondary | ICD-10-CM | POA: Diagnosis not present

## 2023-10-14 DIAGNOSIS — S0990XA Unspecified injury of head, initial encounter: Secondary | ICD-10-CM | POA: Diagnosis not present

## 2023-10-14 DIAGNOSIS — M79643 Pain in unspecified hand: Secondary | ICD-10-CM | POA: Diagnosis not present

## 2023-10-14 DIAGNOSIS — E1140 Type 2 diabetes mellitus with diabetic neuropathy, unspecified: Secondary | ICD-10-CM | POA: Diagnosis not present

## 2023-10-14 NOTE — Telephone Encounter (Signed)
 Verbal orders given for OT.   Copied from CRM #[id number]. Topic: Clinical - Home Health Verbal Orders >> Oct 14, 2023  3:21 PM Fonda T wrote: Caller/Agency: [facility] Callback Number: [phone] Service Requested: Occupational Therapy Frequency: one time a week for 4 weeks, and once a week for every other week for 2 visits Any new concerns about the patient? Yes, patient had a fall, call sent to nurse triage. Has bruising from the fall. Patient reports fell a couple days ago.

## 2023-10-14 NOTE — ED Notes (Signed)
 Patient transported to CT

## 2023-10-14 NOTE — Telephone Encounter (Signed)
 FYI Only or Action Required?: FYI only for provider.  Patient was last seen in primary care on 10/10/2023 by [name], MD.  Called Nurse Triage reporting Triage.  Symptoms began several days ago.  Interventions attempted: Nothing.  Symptoms are: unchanged.  Triage Disposition: Call EMS 911 Now (overriding Go to ED Now (or PCP Triage))  Patient/caregiver understands and will follow disposition?:  Reason for Disposition  Taking Coumadin  (warfarin) or other strong blood thinner, or known bleeding disorder (e.g., thrombocytopenia)  Answer Assessment - Initial Assessment Questions Home health nurse called to report that patient has bruising to the left side of her face and reported to her that she fell 2 days ago.  Patient reports that she just fell and denied syncope or LOC. Denies dizziness, headache or changes in vision. Patient confirmed that she is on Xarelto . Visiting nurse states she has noted no change in mental status.  Patient was advised to be evaluated in the emergency department. As patient does not currently have transportation, EMS was dispatched on her behalf by this RN.  1. MECHANISM: How did the injury happen?      Fall from standing  2. ONSET: When did the injury happen? (e.g., minutes, hours ago)     10/12/23  3. LOCATION: What part of the face is injured?     Left side of body and face  4. APPEARANCE of INJURY: What does the face look like?     Bruised  5. BLEEDING: Is it bleeding now? If Yes, ask: Is it difficult to stop?     Denies  Protocols used: Face Injury-A-AH Copied from CRM [id number]. Topic: Clinical - Red Word Triage >> Oct 14, 2023  3:24 PM Fonda T wrote: [facility] that prompted transfer to Nurse Triage: Received call from home health nurse, Corean, reports patient had a fall, and has bruising on left side of face, and left hand, with pain, from the fall. Patient reports fell a couple days ago.

## 2023-10-14 NOTE — Discharge Instructions (Signed)
 Follow-up with your primary care doctor as needed.  Wear the brace for the next 2 days to help with symptoms of likely of the wrist contusion or sprain.

## 2023-10-14 NOTE — Telephone Encounter (Signed)
 Spoke with patient to advise fax hasn't been received yet; stated she will have her daughter follow up on it.

## 2023-10-14 NOTE — ED Provider Triage Note (Signed)
 Emergency Medicine Provider Triage Evaluation Note  Ebony Scott , a 73 y.o. female  was evaluated in triage.  Pt complains of fall 2 days ago.  Hit face.  Is on anticoagulation..  Review of Systems  Positive: Facial pain and left wrist pain. Negative: Loss consciousness  Physical Exam  BP 121/60 (BP Location: Left Arm)   Pulse 81   Temp 98.5 F (36.9 C) (Oral)   Resp 18   SpO2 96%  Periorbital ecchymosis on left side.  Some swelling of bridge of nose.  No midline cervical spine tenderness but does have tenderness over the left wrist.  Medical Decision Making  Medically screening exam initiated at 6:00 PM.  Appropriate orders placed.  Ebony Scott was informed that the remainder of the evaluation will be completed by another provider, this initial triage assessment does not replace that evaluation, and the importance of remaining in the ED until their evaluation is complete.  Patient following anticoagulation.  Potential for intracranial hemorrhage facial fracture or cervical spine fracture.  Also left wrist tenderness.  Will get imaging.   Ebony Lot, MD 10/14/23 [phone]

## 2023-10-14 NOTE — ED Provider Notes (Signed)
 [place] EMERGENCY DEPARTMENT AT [facility] Provider Note   CSN: [id number] Arrival date & time: 10/14/23  1656     Patient presents with: Ebony Scott Ebony Scott is a 73 y.o. female.    Fall  Patient presents after mechanical fall.  Lost balance 2 days ago and fell and hitting her face.  Now pain in the left wrist.  Also swelling of her face.  Is on anticoagulation no loss conscious.  Has been overall doing well since 2 days ago.    Past Medical History:  Diagnosis Date   Allergy     Anemia    Angio-edema    Breast cancer (HCC) 1998   Left breast, in remission   Bronchiectasis (HCC)    Cataract    CHF (congestive heart failure) (HCC)    Closed displaced fracture of metatarsal bone of right foot 01/20/2019   Closed fracture of fifth metatarsal bone of right foot, initial encounter 01/15/2018   Clostridium difficile colitis 01/14/2018   Coronary artery disease    Depression    some; don't take anything for it (02/20/2012), pt denies dx as of 06/02/21   Diverticulosis    DVT (deep venous thrombosis) (HCC)    Fibromyalgia 11/2011   Fracture of base of fifth metatarsal bone with routine healing, left    GAVE (gastric antral vascular ectasia)    GERD (gastroesophageal reflux disease)    Headache(784.0)    related to allergies; more at different times during the year (02/20/2012)   Hemorrhoids    Hiatal hernia    back and neck   History of blood transfusion 2023   1 unit, 5 units of Iron    Hx of adenomatous colonic polyps 04/12/2016   Hypercholesteremia    good cholesterol is high   Hypothyroidism    h/o Graves disease   IBS (irritable bowel syndrome)    Moderate persistent asthma    -FeV1 72% 2011, -IgE 102 2011, CT sinus Neg 2011   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 Cindy Setter   Sleep apnea    uses cpap nightly   Tracheobronchomalacia    Vasculitis (HCC) 12/13/2017    Prior to Admission medications   Medication Sig Start Date End Date Taking? Authorizing Provider  acetaminophen  (TYLENOL ) 500 MG tablet Take 1 tablet (500 mg total) by mouth every 6 (six) hours as needed for moderate pain (pain score 4-6) or mild pain (pain score 1-3). 08/22/23   Amin, Ankit C, MD  albuterol  (PROVENTIL ) (2.5 MG/3ML) 0.083% nebulizer solution Take 3 mLs (2.5 mg total) by nebulization every 4 (four) hours as needed for wheezing or shortness of breath. 01/01/23   Kozlow, Camellia PARAS, MD  albuterol  (VENTOLIN  HFA) 108 (90 Base) MCG/ACT inhaler Inhale 2 puffs into the lungs every 6 (six) hours as needed for wheezing or shortness of breath. 01/01/23   Kozlow, Camellia PARAS, MD  allopurinol  (ZYLOPRIM ) 100 MG tablet Take 1 tablet (100 mg total) by mouth daily. 03/04/23   [name], MD  Alpha-D-Galactosidase (BEANO PO) Take 2 tablets by mouth in the morning, at noon, and at bedtime.    [provider]  Alpha-Lipoic Acid 600 MG TABS Take 600 mg by mouth in  the morning.    [provider]  atorvastatin  (LIPITOR) 80 MG tablet Take 1 tablet (80 mg total) by mouth every evening. 10/03/23   [name], MD  Azelastine  HCl 137 MCG/SPRAY SOLN 2 sprays once per day 08/02/23   Aletha Bene, MD  Calcium -Magnesium -Vitamin D  (CITRACAL CALCIUM +D) 600-40-500 MG-MG-UNIT TB24 Take 1 tablet by mouth daily.    [provider]  cetirizine  (ZYRTEC ) 10 MG tablet Take 1 tablet (10 mg total) by mouth daily as needed for allergies (Can take a n extra dose during flare ups.). 04/16/23   Kozlow, Camellia PARAS, MD  Cholecalciferol  (VITAMIN D3) 25 MCG (1000 UT) capsule Take 1,000 Units by mouth at bedtime.    [provider]  clindamycin  (CLEOCIN ) 150 MG capsule SMARTSIG:4 Capsule(s) By Mouth Patient not taking: Reported on 10/10/2023  10/03/23   [provider]  clopidogrel  (PLAVIX ) 75 MG tablet  10/04/23   [provider]  denosumab  (PROLIA ) 60 MG/ML SOSY injection Inject 60 mg into the skin every 6 (six) months.    [provider]  dexlansoprazole  (DEXILANT ) 60 MG capsule Take 1 capsule (60 mg total) by mouth in the morning. 04/16/23   Kozlow, Camellia PARAS, MD  diclofenac  Sodium (VOLTAREN ) 1 % GEL Apply 1 Application topically as needed (pain).    [provider]  empagliflozin  (JARDIANCE ) 10 MG TABS tablet Take 1 tablet (10 mg total) by mouth daily. 07/05/23   [name], MD  ENTRESTO  24-26 MG TAKE 1 TABLET BY MOUTH TWICE A DAY 05/07/23   Lonni Slain, MD  EPINEPHRINE  0.3 mg/0.3 mL IJ SOAJ injection USE AS DIRECTED BY YOUR PHYSICIAN INTRAMUSCULARLY AS NEEDED FOR ANAPHYLAXIS 10/09/22   Kozlow, Camellia PARAS, MD  famotidine  (PEPCID ) 20 MG tablet TAKE 1 TABLET (20 MG) BY MOUTH DAILY BEFORE LUNCH AND1 TABLET BY MOUTH EACH NIGHT AT BEDTIME 05/07/23   Kozlow, Camellia PARAS, MD  fluticasone  (FLONASE ) 50 MCG/ACT nasal spray Place 2 sprays into both nostrils daily. 08/02/23   Aletha Bene, MD  Fluticasone -Umeclidin-Vilant (TRELEGY ELLIPTA ) 200-62.5-25 MCG/ACT AEPB Inhale 1 Inhalation into the lungs in the morning. 09/18/23   Kara Dorn NOVAK, MD  folic acid  (FOLVITE ) 1 MG tablet Take 1 tablet (1 mg total) by mouth daily. 03/04/23   [name], MD  furosemide  (LASIX ) 40 MG tablet Take 0.5 tablets (20 mg total) by mouth daily as needed for fluid or edema (Weight gain, edema >3lbs). Monday and Thursday 08/22/23   Caleen Burgess BROCKS, MD  gabapentin  (NEURONTIN ) 300 MG capsule Take 1 capsule (300 mg total) by mouth at bedtime. 05/04/22   Patsy Lenis, MD  guaiFENesin  (MUCINEX ) 600 MG 12 hr tablet Take 600 mg by mouth 2 (two) times daily.    [provider]  HIZENTRA  10 GM/50ML SOLN Inject 15 g into the skin once a week. 13 g Infusion 01/14/23   Kozlow, Camellia PARAS, MD  ipratropium-albuterol  (DUONEB) 0.5-2.5  (3) MG/3ML SOLN Take 3 mLs by nebulization every 6 (six) hours as needed. 07/22/23   Kara Dorn NOVAK, MD  Lactase (LACTOSE FAST ACTING RELIEF PO) Take 2 tablets by mouth 3 (three) times daily before meals.    [provider]  montelukast  (SINGULAIR ) 10 MG tablet Take 1 tablet (10 mg total) by mouth daily after supper. 03/04/23   [name], MD  mupirocin  ointment (BACTROBAN ) 2 % Apply 1 Application topically as needed (wounds). 01/29/23   Kozlow, Camellia PARAS, MD  nystatin  (MYCOSTATIN ) 100000 UNIT/ML suspension Swish, gargle, and swallow  5 ml after Trelegy use 05/24/23   Perri DELENA Meliton Mickey., MD  omeprazole (PRILOSEC) 20 MG capsule Take 20 mg by mouth daily. 10/03/23   [provider]  predniSONE  (DELTASONE ) 5 MG tablet Take 1 tablet (5 mg total) by mouth daily with breakfast. 05/13/23   Rice, Lonni ORN, MD  Prenatal Vit-Fe Fumarate-FA (PRENATAL VITAMINS PO) Take 1 tablet by mouth in the morning.    [provider]  Probiotic Product (ALIGN) 4 MG CAPS Take 4 mg by mouth every evening.    [provider]  Respiratory Therapy Supplies (NEBULIZER MASK ADULT) MISC 1 Device by Does not apply route as directed. 01/01/23   Kozlow, Camellia PARAS, MD  rivaroxaban  (XARELTO ) 20 MG TABS tablet Take 1 tablet (20 mg total) by mouth daily with supper. 08/26/23   Amin, Ankit C, MD  sodium bicarbonate  650 MG tablet Take 650 mg by mouth daily.    [provider]  sodium chloride  HYPERTONIC 3 % nebulizer solution Take 4 mLs by nebulization at bedtime.    [provider]  traZODone  (DESYREL ) 50 MG tablet Take 100 mg by mouth at bedtime as needed. 09/03/23   [provider]    Allergies: Baclofen , Other, Pork-derived products, Shellfish allergy , Shrimp extract, Tetracycline hcl, Xolair  [omalizumab ], Zoledronic  acid, Celecoxib, Dilaudid  [hydromorphone  hcl], Hydrocodone -acetaminophen , Levofloxacin , Molds & smuts, Morphine  and codeine, Oxycodone  hcl, Paroxetine,  Tetracycline hcl, Oxycodone -acetaminophen , Diltiazem  hcl, Dust mite extract, Gamunex [immune globulin ], Rituximab , Rofecoxib, and Tree extract    Review of Systems  Updated Vital Signs BP 121/60 (BP Location: Left Arm)   Pulse 81   Temp 98.5 F (36.9 C) (Oral)   Resp 18   SpO2 96%   Physical Exam Vitals and nursing note reviewed.  HENT:     Head:     Comments: Left-sided periorbital hematoma.  Does have inferior ecchymosis.    Nose:     Comments: Swelling and bruising of bridge of nose. Eyes:     Extraocular Movements: Extraocular movements intact.     Pupils: Pupils are equal, round, and reactive to light.  Cardiovascular:     Rate and Rhythm: Normal rate.  Chest:     Chest wall: No tenderness.  Abdominal:     Tenderness: There is no abdominal tenderness.  Musculoskeletal:     Cervical back: Neck supple.  Neurological:     Mental Status: She is alert.     (all labs ordered are listed, but only abnormal results are displayed) Labs Reviewed - No data to display  EKG: None  Radiology: CT Cervical Spine Wo Contrast Result Date: 10/14/2023 CLINICAL DATA:  Recent fall with neck pain, initial encounter EXAM: CT CERVICAL SPINE WITHOUT CONTRAST TECHNIQUE: Multidetector CT imaging of the cervical spine was performed without intravenous contrast. Multiplanar CT image reconstructions were also generated. RADIATION DOSE REDUCTION: This exam was performed according to the departmental dose-optimization program which includes automated exposure control, adjustment of the mA and/or kV according to patient size and/or use of iterative reconstruction technique. COMPARISON:  None Available. FINDINGS: Alignment: Grade 1 anterolisthesis of C4 on C5 is noted. Skull base and vertebrae: 7 cervical segments are well visualized. Vertebral body height is well maintained. Changes of anterior fusion at C3-4 are noted. Findings of prior fusion at C6-7 are seen. Posterior cerclage wires at C6-7 are  noted as well. Multilevel facet hypertrophic changes are noted worst at C4-5 with the resultant anterolisthesis. No acute fracture or acute facet abnormality is noted. Soft tissues and  spinal canal: Surrounding soft tissue structures are within normal limits. Upper chest: Visualized lung apices are within normal limits. Other: None IMPRESSION: CT of the cervical spine: Multilevel degenerative and postoperative change. Anterolisthesis of C4 on C5 is noted Electronically Signed   By: Oneil Devonshire M.D.   On: 10/14/2023 19:14   CT Maxillofacial Wo Contrast Result Date: 10/14/2023 CLINICAL DATA:  Fall 2 days ago with left eye pain and swelling, initial encounter EXAM: CT MAXILLOFACIAL WITHOUT CONTRAST TECHNIQUE: Multidetector CT imaging of the maxillofacial structures was performed. Multiplanar CT image reconstructions were also generated. RADIATION DOSE REDUCTION: This exam was performed according to the departmental dose-optimization program which includes automated exposure control, adjustment of the mA and/or kV according to patient size and/or use of iterative reconstruction technique. COMPARISON:  None Available. FINDINGS: Osseous: No acute bony abnormality is noted. Orbits: Orbits and their contents are within normal limits. Sinuses: Nasal sinuses are well aerated. Soft tissues: Mild soft tissue swelling is noted in the left supraorbital region consistent with the recent injury. Limited intracranial: Mild atrophic changes are noted. IMPRESSION: Soft tissue swelling in the left supraorbital region consistent with the recent injury. No acute fracture is seen. Electronically Signed   By: Oneil Devonshire M.D.   On: 10/14/2023 19:11   CT HEAD WO CONTRAST Result Date: 10/14/2023 CLINICAL DATA:  Head trauma, minor (Age >= 65y) Fall x couple days ago, bruising to left side of face No hx of stroke/seizure Hx of breast cancer EXAM: CT HEAD WITHOUT CONTRAST TECHNIQUE: Contiguous axial images were obtained from the base of  the skull through the vertex without intravenous contrast. RADIATION DOSE REDUCTION: This exam was performed according to the departmental dose-optimization program which includes automated exposure control, adjustment of the mA and/or kV according to patient size and/or use of iterative reconstruction technique. COMPARISON:  CT head 11/30/2022 FINDINGS: Brain: Cerebral ventricle sizes are concordant with the degree of cerebral volume loss. Patchy and confluent areas of decreased attenuation are noted throughout the deep and periventricular white matter of the cerebral hemispheres bilaterally, compatible with chronic microvascular ischemic disease. No evidence of large-territorial acute infarction. No parenchymal hemorrhage. No mass lesion. No extra-axial collection. No mass effect or midline shift. No hydrocephalus. Basilar cisterns are patent. Vascular: No hyperdense vessel. Skull: No acute fracture or focal lesion. Sinuses/Orbits: Paranasal sinuses and mastoid air cells are clear. The orbits are unremarkable. Other: None. IMPRESSION: No acute intracranial abnormality. Electronically Signed   By: Morgane  Naveau M.D.   On: 10/14/2023 18:11   DG Shoulder Left Result Date: 10/14/2023 CLINICAL DATA:  Pain after fall. EXAM: LEFT SHOULDER - 2+ VIEW COMPARISON:  Shoulder CT 08/14/2022 FINDINGS: There is no evidence of fracture or dislocation. Superior subluxation of the humeral head abutting the undersurface of the acromion. Moderate acromioclavicular degenerative change. Contour deformity of the medial humeral head corresponds to osteochondral defect on prior CT. Soft tissues are unremarkable. IMPRESSION: 1. No acute fracture or dislocation of the left shoulder. 2. Superior subluxation of the humeral head abutting the undersurface of the acromion, consistent with chronic rotator cuff arthropathy. 3. Moderate acromioclavicular degenerative change. Electronically Signed   By: Andrea Gasman M.D.   On: 10/14/2023  17:58   DG Elbow Complete Left Result Date: 10/14/2023 CLINICAL DATA:  Pain after fall. EXAM: LEFT ELBOW - COMPLETE 3+ VIEW COMPARISON:  None Available. FINDINGS: There is no evidence of fracture, dislocation, or joint effusion. Minimal degenerative spurring. Minimal olecranon spur. Soft tissues are unremarkable. IMPRESSION: No fracture or  dislocation of the left elbow. Electronically Signed   By: Andrea Gasman M.D.   On: 10/14/2023 17:56   DG Hand Complete Left Result Date: 10/14/2023 CLINICAL DATA:  Pain after fall.  Hand and wrist pain. EXAM: LEFT HAND - COMPLETE 3+ VIEW; LEFT WRIST - COMPLETE 3+ VIEW COMPARISON:  Hand radiograph 04/27/2022 FINDINGS: Hand: No acute fracture or dislocation. Multifocal chronic arthropathy, primarily involving the metacarpal phalangeal joints and digits, without significant progression from prior exam. The index finger D IP is obscured by overlying monitoring device. No focal soft tissue abnormality. Wrist: Subtle lucency involving the radial styloid may represent a nondisplaced fracture. This is seen only on the PA view. No additional fracture. No dislocation. There is chondrocalcinosis of the triangular fibrocartilage. Mild soft tissue edema. IMPRESSION: 1. Subtle lucency involving the radial styloid may represent a nondisplaced fracture. This is seen only on the PA view of the wrist. Recommend correlation with point tenderness. 2. No additional fracture of the hand or wrist. 3. Multifocal chronic arthropathy, primarily involving the metacarpophalangeal joints and digits, without significant progression from 2024 exam. Electronically Signed   By: Andrea Gasman M.D.   On: 10/14/2023 17:56   DG Wrist Complete Left Result Date: 10/14/2023 CLINICAL DATA:  Pain after fall.  Hand and wrist pain. EXAM: LEFT HAND - COMPLETE 3+ VIEW; LEFT WRIST - COMPLETE 3+ VIEW COMPARISON:  Hand radiograph 04/27/2022 FINDINGS: Hand: No acute fracture or dislocation. Multifocal chronic  arthropathy, primarily involving the metacarpal phalangeal joints and digits, without significant progression from prior exam. The index finger D IP is obscured by overlying monitoring device. No focal soft tissue abnormality. Wrist: Subtle lucency involving the radial styloid may represent a nondisplaced fracture. This is seen only on the PA view. No additional fracture. No dislocation. There is chondrocalcinosis of the triangular fibrocartilage. Mild soft tissue edema. IMPRESSION: 1. Subtle lucency involving the radial styloid may represent a nondisplaced fracture. This is seen only on the PA view of the wrist. Recommend correlation with point tenderness. 2. No additional fracture of the hand or wrist. 3. Multifocal chronic arthropathy, primarily involving the metacarpophalangeal joints and digits, without significant progression from 2024 exam. Electronically Signed   By: Andrea Gasman M.D.   On: 10/14/2023 17:56     Procedures   Medications Ordered in the ED - No data to display                                  Medical Decision Making Amount and/or Complexity of Data Reviewed Radiology: ordered.   Patient with fall.  Left wrist pain.  Minimal tenderness of elbow or shoulder.  Does have swelling face and has been on anticoagulation.  Head CT cervical spine CT and maxillofacial CT overall reassuring.  Left wrist did have potential fracture of the radial styloid, however there is no specific tenderness of the spots I doubt this is an actual finding.  Does have some tenderness at the base of the thumb.  No snuffbox tenderness but for now we will give Velcro thumb spica to follow-up with PCP as needed.  Will discharge home.     Final diagnoses:  Facial hematoma, initial encounter  Contusion of left wrist, initial encounter    ED Discharge Orders     None          Patsey Lot, MD 10/14/23 2113

## 2023-10-14 NOTE — ED Triage Notes (Signed)
 Clemens face first 2 days ago. Bruising to left eye/forehead. Left shoulder/arm/hand. +pms bilaterally. +thinners (xerelto). No LOC.

## 2023-10-14 NOTE — Progress Notes (Signed)
 Orthopedic Tech Progress Note Patient Details:  Ebony Scott 1950/05/25 [id number]  Ortho Devices Type of Ortho Device: Thumb velcro splint Ortho Device/Splint Location: lue Ortho Device/Splint Interventions: Ordered, Application, Adjustment   Post Interventions Patient Tolerated: Well Instructions Provided: Care of device, Adjustment of device  Chandra Dorn PARAS 10/14/2023, 9:21 PM

## 2023-10-15 DIAGNOSIS — S60212A Contusion of left wrist, initial encounter: Secondary | ICD-10-CM | POA: Diagnosis not present

## 2023-10-16 ENCOUNTER — Other Ambulatory Visit: Payer: Self-pay | Admitting: Allergy and Immunology

## 2023-10-16 ENCOUNTER — Other Ambulatory Visit: Payer: Self-pay

## 2023-10-16 ENCOUNTER — Telehealth: Payer: Self-pay

## 2023-10-16 DIAGNOSIS — M810 Age-related osteoporosis without current pathological fracture: Secondary | ICD-10-CM

## 2023-10-16 MED ORDER — DENOSUMAB 60 MG/ML ~~LOC~~ SOSY: 60.0000 mg | PREFILLED_SYRINGE | Freq: Once | SUBCUTANEOUS | Status: AC

## 2023-10-16 NOTE — Telephone Encounter (Signed)
 Prolia VOB initiated via [email or web address]  Next Prolia inj DUE: NOW

## 2023-10-17 DIAGNOSIS — E1140 Type 2 diabetes mellitus with diabetic neuropathy, unspecified: Secondary | ICD-10-CM | POA: Diagnosis not present

## 2023-10-17 DIAGNOSIS — J9620 Acute and chronic respiratory failure, unspecified whether with hypoxia or hypercapnia: Secondary | ICD-10-CM | POA: Diagnosis not present

## 2023-10-17 DIAGNOSIS — M059 Rheumatoid arthritis with rheumatoid factor, unspecified: Secondary | ICD-10-CM | POA: Diagnosis not present

## 2023-10-17 DIAGNOSIS — B9689 Other specified bacterial agents as the cause of diseases classified elsewhere: Secondary | ICD-10-CM | POA: Diagnosis not present

## 2023-10-17 DIAGNOSIS — J4550 Severe persistent asthma, uncomplicated: Secondary | ICD-10-CM | POA: Diagnosis not present

## 2023-10-17 DIAGNOSIS — R7881 Bacteremia: Secondary | ICD-10-CM | POA: Diagnosis not present

## 2023-10-17 DIAGNOSIS — N179 Acute kidney failure, unspecified: Secondary | ICD-10-CM | POA: Diagnosis not present

## 2023-10-17 DIAGNOSIS — J189 Pneumonia, unspecified organism: Secondary | ICD-10-CM | POA: Diagnosis not present

## 2023-10-17 DIAGNOSIS — T827XXD Infection and inflammatory reaction due to other cardiac and vascular devices, implants and grafts, subsequent encounter: Secondary | ICD-10-CM | POA: Diagnosis not present

## 2023-10-17 NOTE — Telephone Encounter (Signed)
 Ebony Scott

## 2023-10-18 ENCOUNTER — Other Ambulatory Visit (HOSPITAL_COMMUNITY): Payer: Self-pay

## 2023-10-18 NOTE — Telephone Encounter (Addendum)
 Buy and [employer] PA submitted via Latent. Key: [id number]   APPROVED. 10/18/23-02/19/24. PA Case: [id number]   PHARMACY BENEFIT: $0

## 2023-10-22 DIAGNOSIS — J189 Pneumonia, unspecified organism: Secondary | ICD-10-CM | POA: Diagnosis not present

## 2023-10-22 DIAGNOSIS — N179 Acute kidney failure, unspecified: Secondary | ICD-10-CM | POA: Diagnosis not present

## 2023-10-22 DIAGNOSIS — B9689 Other specified bacterial agents as the cause of diseases classified elsewhere: Secondary | ICD-10-CM | POA: Diagnosis not present

## 2023-10-22 DIAGNOSIS — J9620 Acute and chronic respiratory failure, unspecified whether with hypoxia or hypercapnia: Secondary | ICD-10-CM | POA: Diagnosis not present

## 2023-10-22 DIAGNOSIS — E1140 Type 2 diabetes mellitus with diabetic neuropathy, unspecified: Secondary | ICD-10-CM | POA: Diagnosis not present

## 2023-10-22 DIAGNOSIS — J4550 Severe persistent asthma, uncomplicated: Secondary | ICD-10-CM | POA: Diagnosis not present

## 2023-10-22 DIAGNOSIS — T827XXD Infection and inflammatory reaction due to other cardiac and vascular devices, implants and grafts, subsequent encounter: Secondary | ICD-10-CM | POA: Diagnosis not present

## 2023-10-22 DIAGNOSIS — M059 Rheumatoid arthritis with rheumatoid factor, unspecified: Secondary | ICD-10-CM | POA: Diagnosis not present

## 2023-10-22 DIAGNOSIS — R7881 Bacteremia: Secondary | ICD-10-CM | POA: Diagnosis not present

## 2023-10-22 NOTE — Telephone Encounter (Signed)
 Pt ready for scheduling for PROLIA  on or after : 10/22/23  Option# 1: Buy/Bill (Office supplied medication)  Out-of-pocket cost due at time of clinic visit: $357  Number of injection/visits approved: 2  Primary: [employer] Prolia  co-insurance: 20% Admin fee co-insurance: 20%  Secondary: --- Prolia  co-insurance:  Admin fee co-insurance:   Medical Benefit Details: Date Benefits were checked: 10/17/23 Deductible: NO/ Coinsurance: 20%/ Admin Fee: 20%  Prior Auth: APPROVED PA# [id number] Expiration Date: 10/18/23-02/19/24  # of doses approved: 2 ----------------------------------------------------------------------- Option# 2- Med Obtained from pharmacy:  Pharmacy benefit: Copay $0 (Paid to pharmacy) Admin Fee: 20% (Pay at clinic)  Prior Auth: N/A PA# Expiration Date:   # of doses approved:   If patient wants fill through the pharmacy benefit please send prescription to: [facility], and include estimated need by date in rx notes. Pharmacy will ship medication directly to the office.  Patient NOT eligible for Prolia  Copay Card. Copay Card can make patient's cost as little as $25. Link to apply: [email or web address]  ** This summary of benefits is an estimation of the patient's out-of-pocket cost. Exact cost may very based on individual plan coverage.

## 2023-10-24 DIAGNOSIS — J189 Pneumonia, unspecified organism: Secondary | ICD-10-CM | POA: Diagnosis not present

## 2023-10-24 DIAGNOSIS — B9689 Other specified bacterial agents as the cause of diseases classified elsewhere: Secondary | ICD-10-CM | POA: Diagnosis not present

## 2023-10-24 DIAGNOSIS — M059 Rheumatoid arthritis with rheumatoid factor, unspecified: Secondary | ICD-10-CM | POA: Diagnosis not present

## 2023-10-24 DIAGNOSIS — T827XXD Infection and inflammatory reaction due to other cardiac and vascular devices, implants and grafts, subsequent encounter: Secondary | ICD-10-CM | POA: Diagnosis not present

## 2023-10-24 DIAGNOSIS — J9620 Acute and chronic respiratory failure, unspecified whether with hypoxia or hypercapnia: Secondary | ICD-10-CM | POA: Diagnosis not present

## 2023-10-24 DIAGNOSIS — R7881 Bacteremia: Secondary | ICD-10-CM | POA: Diagnosis not present

## 2023-10-24 DIAGNOSIS — J4550 Severe persistent asthma, uncomplicated: Secondary | ICD-10-CM | POA: Diagnosis not present

## 2023-10-24 DIAGNOSIS — E1140 Type 2 diabetes mellitus with diabetic neuropathy, unspecified: Secondary | ICD-10-CM | POA: Diagnosis not present

## 2023-10-24 DIAGNOSIS — N179 Acute kidney failure, unspecified: Secondary | ICD-10-CM | POA: Diagnosis not present

## 2023-10-25 DIAGNOSIS — D801 Nonfamilial hypogammaglobulinemia: Secondary | ICD-10-CM | POA: Diagnosis not present

## 2023-10-25 DIAGNOSIS — D84821 Immunodeficiency due to drugs: Secondary | ICD-10-CM | POA: Diagnosis not present

## 2023-10-28 DIAGNOSIS — J9620 Acute and chronic respiratory failure, unspecified whether with hypoxia or hypercapnia: Secondary | ICD-10-CM | POA: Diagnosis not present

## 2023-10-28 DIAGNOSIS — J189 Pneumonia, unspecified organism: Secondary | ICD-10-CM | POA: Diagnosis not present

## 2023-10-28 DIAGNOSIS — B9689 Other specified bacterial agents as the cause of diseases classified elsewhere: Secondary | ICD-10-CM | POA: Diagnosis not present

## 2023-10-28 DIAGNOSIS — J4550 Severe persistent asthma, uncomplicated: Secondary | ICD-10-CM | POA: Diagnosis not present

## 2023-10-28 DIAGNOSIS — N179 Acute kidney failure, unspecified: Secondary | ICD-10-CM | POA: Diagnosis not present

## 2023-10-28 DIAGNOSIS — M059 Rheumatoid arthritis with rheumatoid factor, unspecified: Secondary | ICD-10-CM | POA: Diagnosis not present

## 2023-10-28 DIAGNOSIS — R7881 Bacteremia: Secondary | ICD-10-CM | POA: Diagnosis not present

## 2023-10-28 DIAGNOSIS — T827XXD Infection and inflammatory reaction due to other cardiac and vascular devices, implants and grafts, subsequent encounter: Secondary | ICD-10-CM | POA: Diagnosis not present

## 2023-10-28 DIAGNOSIS — E1140 Type 2 diabetes mellitus with diabetic neuropathy, unspecified: Secondary | ICD-10-CM | POA: Diagnosis not present

## 2023-10-29 DIAGNOSIS — M059 Rheumatoid arthritis with rheumatoid factor, unspecified: Secondary | ICD-10-CM | POA: Diagnosis not present

## 2023-10-29 DIAGNOSIS — J189 Pneumonia, unspecified organism: Secondary | ICD-10-CM | POA: Diagnosis not present

## 2023-10-29 DIAGNOSIS — R7881 Bacteremia: Secondary | ICD-10-CM | POA: Diagnosis not present

## 2023-10-29 DIAGNOSIS — B9689 Other specified bacterial agents as the cause of diseases classified elsewhere: Secondary | ICD-10-CM | POA: Diagnosis not present

## 2023-10-29 DIAGNOSIS — I509 Heart failure, unspecified: Secondary | ICD-10-CM

## 2023-10-29 DIAGNOSIS — J9620 Acute and chronic respiratory failure, unspecified whether with hypoxia or hypercapnia: Secondary | ICD-10-CM | POA: Diagnosis not present

## 2023-10-29 DIAGNOSIS — T827XXD Infection and inflammatory reaction due to other cardiac and vascular devices, implants and grafts, subsequent encounter: Secondary | ICD-10-CM | POA: Diagnosis not present

## 2023-10-29 DIAGNOSIS — N179 Acute kidney failure, unspecified: Secondary | ICD-10-CM | POA: Diagnosis not present

## 2023-10-29 DIAGNOSIS — I255 Ischemic cardiomyopathy: Secondary | ICD-10-CM

## 2023-10-29 DIAGNOSIS — I110 Hypertensive heart disease with heart failure: Secondary | ICD-10-CM

## 2023-10-29 DIAGNOSIS — J4550 Severe persistent asthma, uncomplicated: Secondary | ICD-10-CM | POA: Diagnosis not present

## 2023-10-29 DIAGNOSIS — E1140 Type 2 diabetes mellitus with diabetic neuropathy, unspecified: Secondary | ICD-10-CM | POA: Diagnosis not present

## 2023-10-30 ENCOUNTER — Telehealth: Payer: Self-pay | Admitting: Allergy and Immunology

## 2023-10-30 DIAGNOSIS — E1140 Type 2 diabetes mellitus with diabetic neuropathy, unspecified: Secondary | ICD-10-CM | POA: Diagnosis not present

## 2023-10-30 DIAGNOSIS — J189 Pneumonia, unspecified organism: Secondary | ICD-10-CM | POA: Diagnosis not present

## 2023-10-30 DIAGNOSIS — M059 Rheumatoid arthritis with rheumatoid factor, unspecified: Secondary | ICD-10-CM | POA: Diagnosis not present

## 2023-10-30 DIAGNOSIS — N179 Acute kidney failure, unspecified: Secondary | ICD-10-CM | POA: Diagnosis not present

## 2023-10-30 DIAGNOSIS — J9620 Acute and chronic respiratory failure, unspecified whether with hypoxia or hypercapnia: Secondary | ICD-10-CM | POA: Diagnosis not present

## 2023-10-30 DIAGNOSIS — B9689 Other specified bacterial agents as the cause of diseases classified elsewhere: Secondary | ICD-10-CM | POA: Diagnosis not present

## 2023-10-30 DIAGNOSIS — R7881 Bacteremia: Secondary | ICD-10-CM | POA: Diagnosis not present

## 2023-10-30 DIAGNOSIS — T827XXD Infection and inflammatory reaction due to other cardiac and vascular devices, implants and grafts, subsequent encounter: Secondary | ICD-10-CM | POA: Diagnosis not present

## 2023-10-30 DIAGNOSIS — J4550 Severe persistent asthma, uncomplicated: Secondary | ICD-10-CM | POA: Diagnosis not present

## 2023-10-30 NOTE — Telephone Encounter (Signed)
 PT called to see if Dr Kozlow can work with company to decrease amount of time the infusion takes (currently ~5 hours), since PT has done well with dosage so far. She asked if Kozlow could include PT on convo with infusion provider  I advised that I would get sent back to provider

## 2023-10-30 NOTE — Telephone Encounter (Signed)
 Forwarding message to provider.

## 2023-10-31 NOTE — Telephone Encounter (Signed)
 I have reached out to Anderson at [employer] regarding infusion time will let you know what I find out

## 2023-10-31 NOTE — Telephone Encounter (Signed)
 L/m for patient daughter Charlies that I have reached regarding infusion time and will get back in touch with her

## 2023-11-01 DIAGNOSIS — J9620 Acute and chronic respiratory failure, unspecified whether with hypoxia or hypercapnia: Secondary | ICD-10-CM | POA: Diagnosis not present

## 2023-11-01 DIAGNOSIS — T827XXD Infection and inflammatory reaction due to other cardiac and vascular devices, implants and grafts, subsequent encounter: Secondary | ICD-10-CM | POA: Diagnosis not present

## 2023-11-01 DIAGNOSIS — J4550 Severe persistent asthma, uncomplicated: Secondary | ICD-10-CM | POA: Diagnosis not present

## 2023-11-01 DIAGNOSIS — N179 Acute kidney failure, unspecified: Secondary | ICD-10-CM | POA: Diagnosis not present

## 2023-11-01 DIAGNOSIS — E1140 Type 2 diabetes mellitus with diabetic neuropathy, unspecified: Secondary | ICD-10-CM | POA: Diagnosis not present

## 2023-11-01 DIAGNOSIS — M059 Rheumatoid arthritis with rheumatoid factor, unspecified: Secondary | ICD-10-CM | POA: Diagnosis not present

## 2023-11-01 DIAGNOSIS — B9689 Other specified bacterial agents as the cause of diseases classified elsewhere: Secondary | ICD-10-CM | POA: Diagnosis not present

## 2023-11-01 DIAGNOSIS — R7881 Bacteremia: Secondary | ICD-10-CM | POA: Diagnosis not present

## 2023-11-01 DIAGNOSIS — J189 Pneumonia, unspecified organism: Secondary | ICD-10-CM | POA: Diagnosis not present

## 2023-11-04 NOTE — Telephone Encounter (Signed)
 Ebony Scott called for an update and stated to call her, not her daughter as she is working nights and asleep during the day. Ebony Scott can be reached at [phone]

## 2023-11-05 DIAGNOSIS — J189 Pneumonia, unspecified organism: Secondary | ICD-10-CM | POA: Diagnosis not present

## 2023-11-05 DIAGNOSIS — T827XXD Infection and inflammatory reaction due to other cardiac and vascular devices, implants and grafts, subsequent encounter: Secondary | ICD-10-CM | POA: Diagnosis not present

## 2023-11-05 DIAGNOSIS — M059 Rheumatoid arthritis with rheumatoid factor, unspecified: Secondary | ICD-10-CM | POA: Diagnosis not present

## 2023-11-05 DIAGNOSIS — R7881 Bacteremia: Secondary | ICD-10-CM | POA: Diagnosis not present

## 2023-11-05 DIAGNOSIS — N179 Acute kidney failure, unspecified: Secondary | ICD-10-CM | POA: Diagnosis not present

## 2023-11-05 DIAGNOSIS — E1140 Type 2 diabetes mellitus with diabetic neuropathy, unspecified: Secondary | ICD-10-CM | POA: Diagnosis not present

## 2023-11-05 DIAGNOSIS — J9620 Acute and chronic respiratory failure, unspecified whether with hypoxia or hypercapnia: Secondary | ICD-10-CM | POA: Diagnosis not present

## 2023-11-05 DIAGNOSIS — J4550 Severe persistent asthma, uncomplicated: Secondary | ICD-10-CM | POA: Diagnosis not present

## 2023-11-05 DIAGNOSIS — B9689 Other specified bacterial agents as the cause of diseases classified elsewhere: Secondary | ICD-10-CM | POA: Diagnosis not present

## 2023-11-05 NOTE — Telephone Encounter (Signed)
 Called patient and advised per [facility] can increase to per hour which will cut off an hour and 10 mins for infusion.

## 2023-11-08 DIAGNOSIS — T827XXD Infection and inflammatory reaction due to other cardiac and vascular devices, implants and grafts, subsequent encounter: Secondary | ICD-10-CM | POA: Diagnosis not present

## 2023-11-08 DIAGNOSIS — R7881 Bacteremia: Secondary | ICD-10-CM | POA: Diagnosis not present

## 2023-11-08 DIAGNOSIS — J4550 Severe persistent asthma, uncomplicated: Secondary | ICD-10-CM | POA: Diagnosis not present

## 2023-11-08 DIAGNOSIS — M059 Rheumatoid arthritis with rheumatoid factor, unspecified: Secondary | ICD-10-CM | POA: Diagnosis not present

## 2023-11-08 DIAGNOSIS — N179 Acute kidney failure, unspecified: Secondary | ICD-10-CM | POA: Diagnosis not present

## 2023-11-08 DIAGNOSIS — E1140 Type 2 diabetes mellitus with diabetic neuropathy, unspecified: Secondary | ICD-10-CM | POA: Diagnosis not present

## 2023-11-08 DIAGNOSIS — J9620 Acute and chronic respiratory failure, unspecified whether with hypoxia or hypercapnia: Secondary | ICD-10-CM | POA: Diagnosis not present

## 2023-11-08 DIAGNOSIS — B9689 Other specified bacterial agents as the cause of diseases classified elsewhere: Secondary | ICD-10-CM | POA: Diagnosis not present

## 2023-11-08 DIAGNOSIS — J189 Pneumonia, unspecified organism: Secondary | ICD-10-CM | POA: Diagnosis not present

## 2023-11-12 DIAGNOSIS — M059 Rheumatoid arthritis with rheumatoid factor, unspecified: Secondary | ICD-10-CM | POA: Diagnosis not present

## 2023-11-12 DIAGNOSIS — E1140 Type 2 diabetes mellitus with diabetic neuropathy, unspecified: Secondary | ICD-10-CM | POA: Diagnosis not present

## 2023-11-12 DIAGNOSIS — J189 Pneumonia, unspecified organism: Secondary | ICD-10-CM | POA: Diagnosis not present

## 2023-11-12 DIAGNOSIS — N179 Acute kidney failure, unspecified: Secondary | ICD-10-CM | POA: Diagnosis not present

## 2023-11-12 DIAGNOSIS — B9689 Other specified bacterial agents as the cause of diseases classified elsewhere: Secondary | ICD-10-CM | POA: Diagnosis not present

## 2023-11-12 DIAGNOSIS — J9620 Acute and chronic respiratory failure, unspecified whether with hypoxia or hypercapnia: Secondary | ICD-10-CM | POA: Diagnosis not present

## 2023-11-12 DIAGNOSIS — R7881 Bacteremia: Secondary | ICD-10-CM | POA: Diagnosis not present

## 2023-11-12 DIAGNOSIS — T827XXD Infection and inflammatory reaction due to other cardiac and vascular devices, implants and grafts, subsequent encounter: Secondary | ICD-10-CM | POA: Diagnosis not present

## 2023-11-12 DIAGNOSIS — J4550 Severe persistent asthma, uncomplicated: Secondary | ICD-10-CM | POA: Diagnosis not present

## 2023-11-13 DIAGNOSIS — B9689 Other specified bacterial agents as the cause of diseases classified elsewhere: Secondary | ICD-10-CM | POA: Diagnosis not present

## 2023-11-18 DIAGNOSIS — M059 Rheumatoid arthritis with rheumatoid factor, unspecified: Secondary | ICD-10-CM | POA: Diagnosis not present

## 2023-11-18 DIAGNOSIS — E1140 Type 2 diabetes mellitus with diabetic neuropathy, unspecified: Secondary | ICD-10-CM | POA: Diagnosis not present

## 2023-11-18 DIAGNOSIS — J9620 Acute and chronic respiratory failure, unspecified whether with hypoxia or hypercapnia: Secondary | ICD-10-CM | POA: Diagnosis not present

## 2023-11-18 DIAGNOSIS — J4550 Severe persistent asthma, uncomplicated: Secondary | ICD-10-CM | POA: Diagnosis not present

## 2023-11-18 DIAGNOSIS — N179 Acute kidney failure, unspecified: Secondary | ICD-10-CM | POA: Diagnosis not present

## 2023-11-18 DIAGNOSIS — B9689 Other specified bacterial agents as the cause of diseases classified elsewhere: Secondary | ICD-10-CM | POA: Diagnosis not present

## 2023-11-18 DIAGNOSIS — R7881 Bacteremia: Secondary | ICD-10-CM | POA: Diagnosis not present

## 2023-11-18 DIAGNOSIS — T827XXD Infection and inflammatory reaction due to other cardiac and vascular devices, implants and grafts, subsequent encounter: Secondary | ICD-10-CM | POA: Diagnosis not present

## 2023-11-18 DIAGNOSIS — J189 Pneumonia, unspecified organism: Secondary | ICD-10-CM | POA: Diagnosis not present

## 2023-11-19 DIAGNOSIS — R7881 Bacteremia: Secondary | ICD-10-CM | POA: Diagnosis not present

## 2023-11-19 DIAGNOSIS — J189 Pneumonia, unspecified organism: Secondary | ICD-10-CM | POA: Diagnosis not present

## 2023-11-19 DIAGNOSIS — N179 Acute kidney failure, unspecified: Secondary | ICD-10-CM | POA: Diagnosis not present

## 2023-11-19 DIAGNOSIS — E1140 Type 2 diabetes mellitus with diabetic neuropathy, unspecified: Secondary | ICD-10-CM | POA: Diagnosis not present

## 2023-11-19 DIAGNOSIS — T827XXD Infection and inflammatory reaction due to other cardiac and vascular devices, implants and grafts, subsequent encounter: Secondary | ICD-10-CM | POA: Diagnosis not present

## 2023-11-19 DIAGNOSIS — J9620 Acute and chronic respiratory failure, unspecified whether with hypoxia or hypercapnia: Secondary | ICD-10-CM | POA: Diagnosis not present

## 2023-11-19 DIAGNOSIS — M059 Rheumatoid arthritis with rheumatoid factor, unspecified: Secondary | ICD-10-CM | POA: Diagnosis not present

## 2023-11-19 DIAGNOSIS — J4550 Severe persistent asthma, uncomplicated: Secondary | ICD-10-CM | POA: Diagnosis not present

## 2023-11-19 DIAGNOSIS — B9689 Other specified bacterial agents as the cause of diseases classified elsewhere: Secondary | ICD-10-CM | POA: Diagnosis not present

## 2023-11-19 IMAGING — US US ABDOMEN COMPLETE
1 series · 14 of 25 positions shown · non-contrast
Comparison: CT 10/04/2020

CLINICAL DATA: Abdominal distension, post cholecystectomy

EXAM:
ABDOMEN ULTRASOUND COMPLETE

[Series 1: us abdomen complete · 0.23mm/px · 14 of 61 slices shown]
[im 1/61]
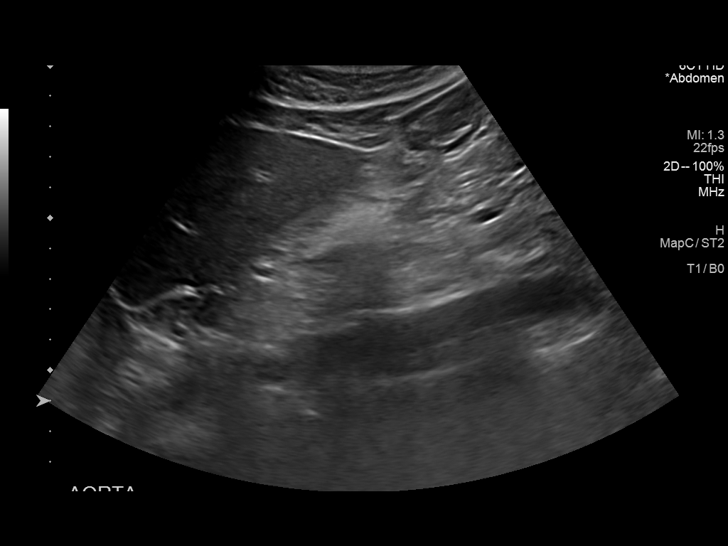
[im 6/61]
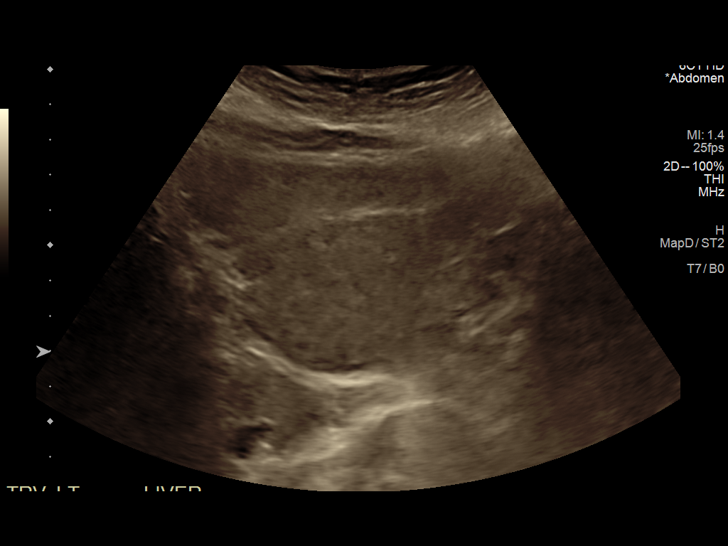
[im 11/61]
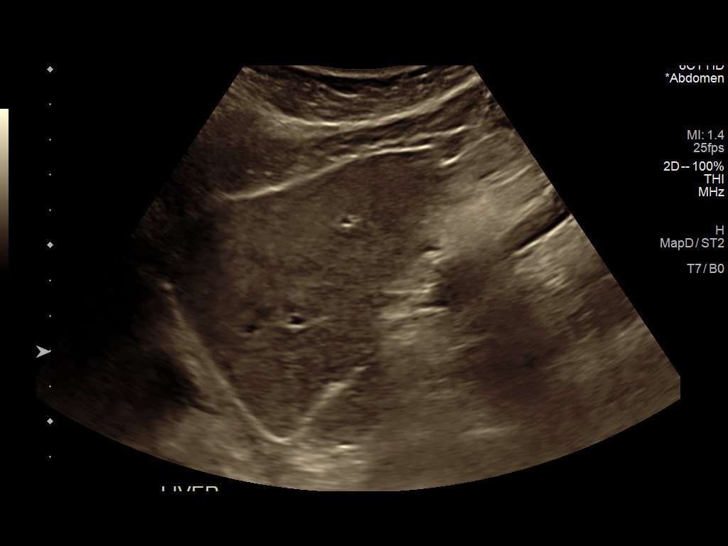
[im 16/61]
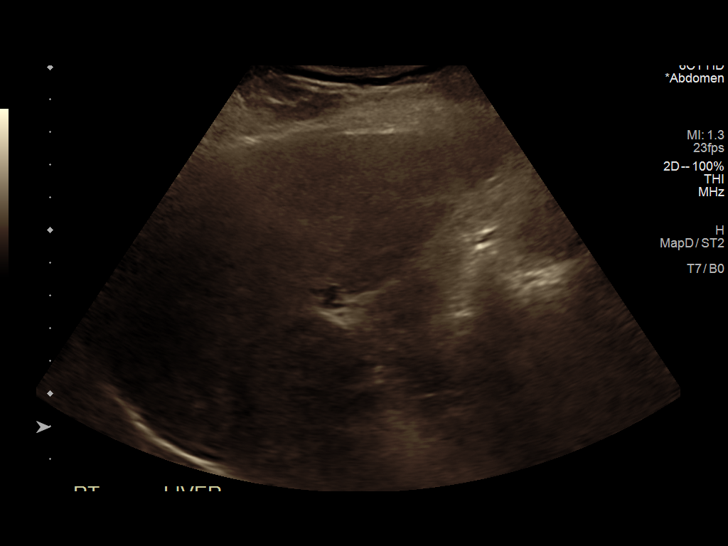
[im 21/61]
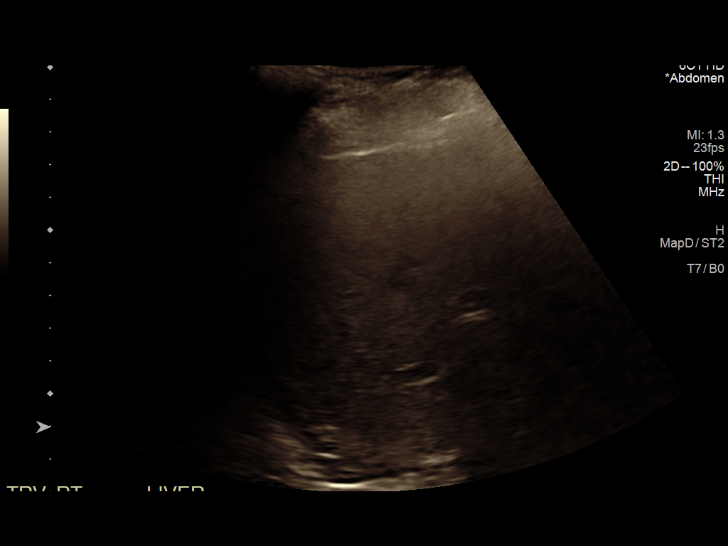
[im 23/61]
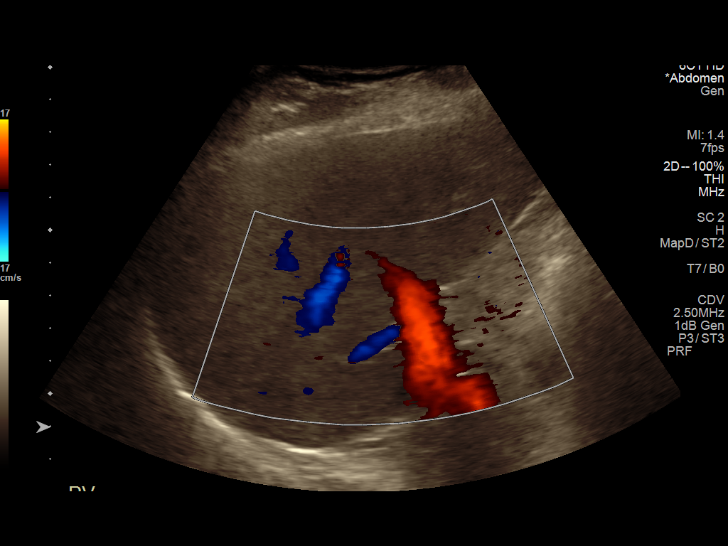
[im 28/61]
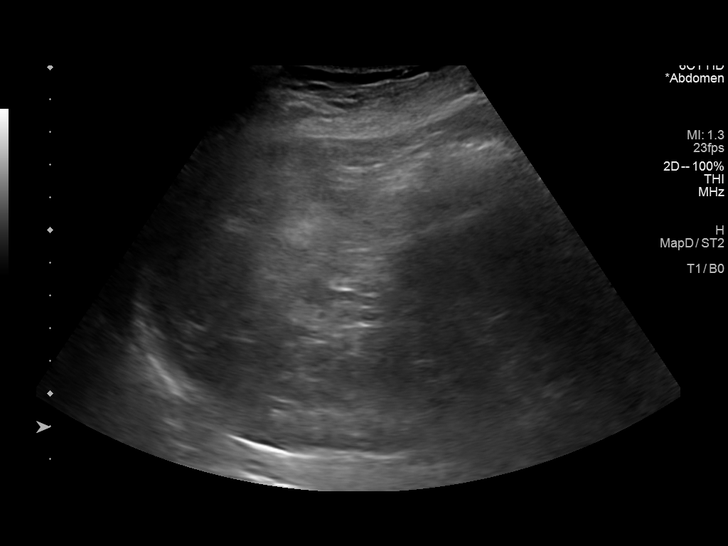
[im 33/61]
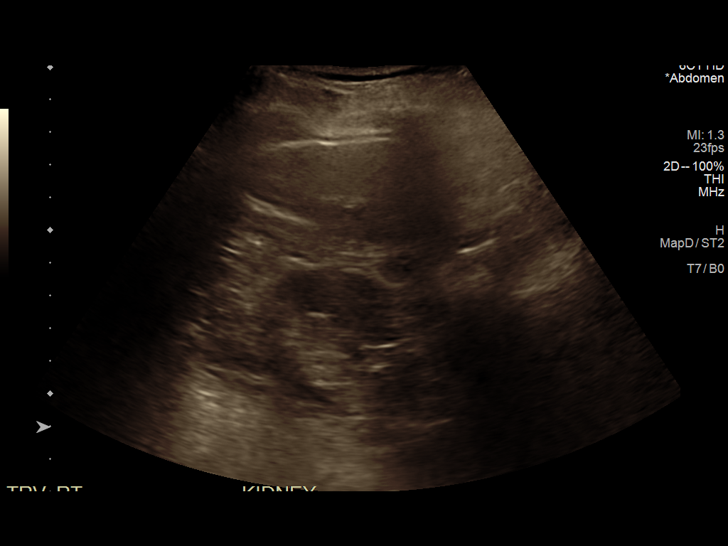
[im 38/61]
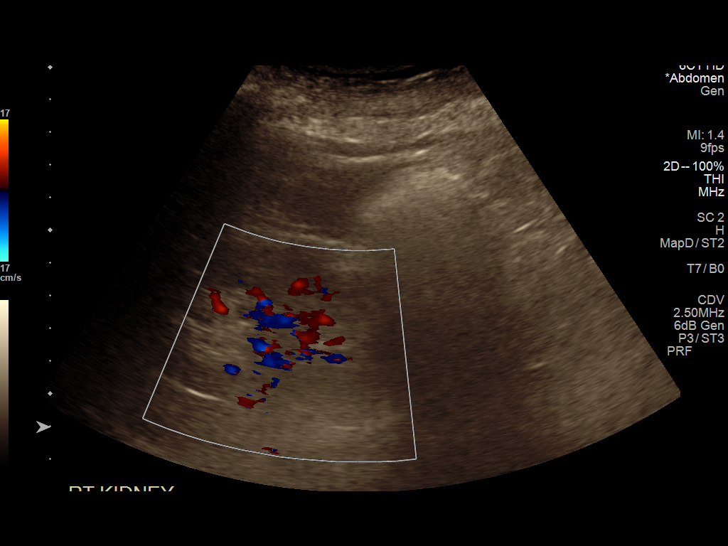
[im 41/61]
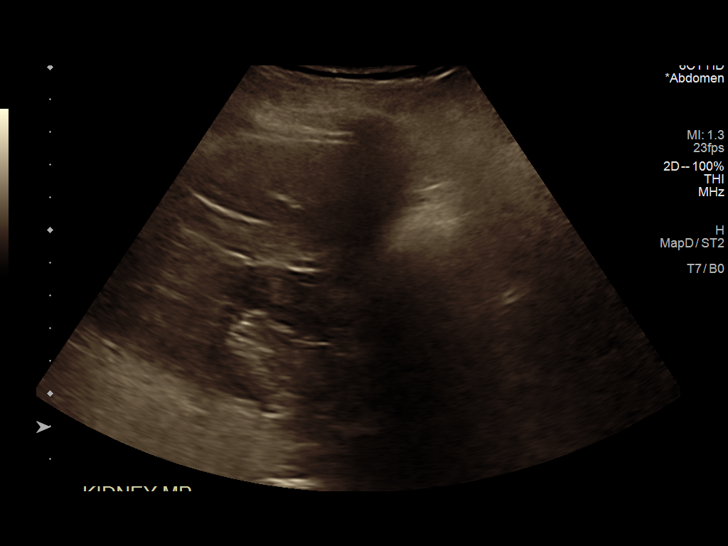
[im 46/61]
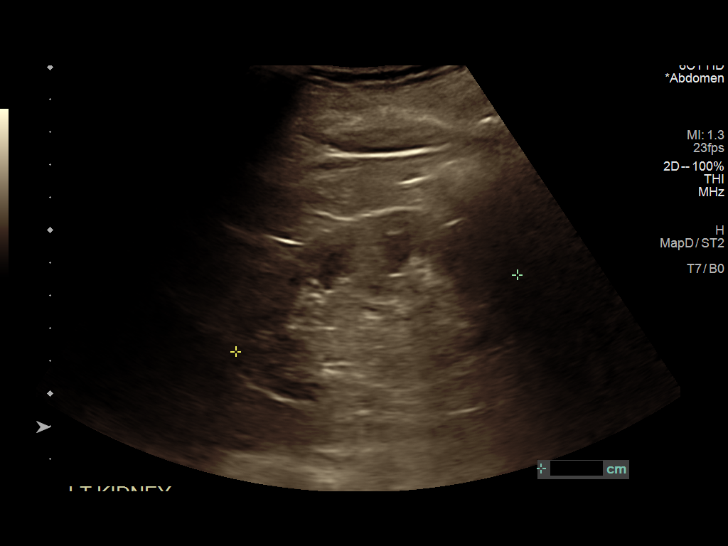
[im 51/61]
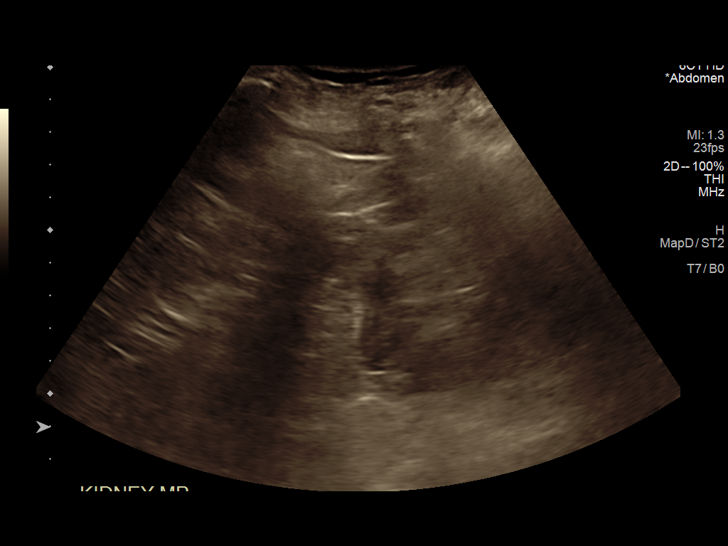
[im 56/61]
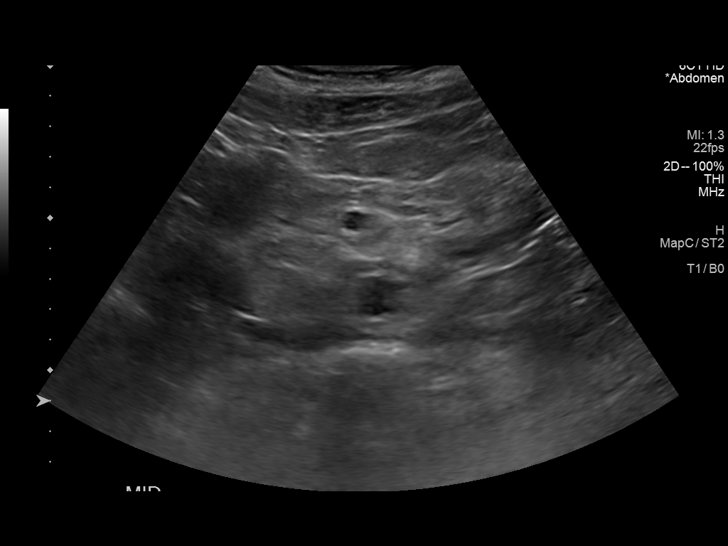
[im 61/61]
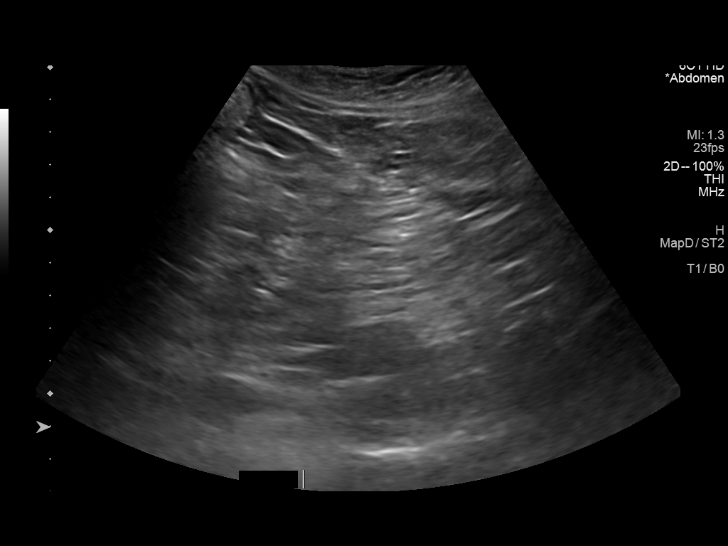

[14 of 25 positions shown; findings below may reference images not displayed]

FINDINGS: Gallbladder: Surgically absent

Common bile duct: Diameter: 4 mm, unremarkable

Liver: No focal lesion identified. Within normal limits in
parenchymal echogenicity. Portal vein is patent on color Doppler
imaging with normal direction of blood flow towards the liver.

IVC: No abnormality visualized.

Pancreas: Visualized segments unremarkable, portions obscured by
overlying bowel gas.

Spleen: Craniocaudal 4.3 cm, appearance within normal limits.

Right Kidney: Length: 8.7. Echogenicity within normal limits. No
mass or hydronephrosis visualized.

Left Kidney: Length: 8.9. Echogenicity within normal limits. No mass
or hydronephrosis visualized.

Abdominal aorta: No aneurysm visualized.

Other findings: Additional images in the right lower quadrant area
of concern demonstrate no mass or other pathology.
IMPRESSION: 1. No acute findings post cholecystectomy.

## 2023-11-20 DIAGNOSIS — R7881 Bacteremia: Secondary | ICD-10-CM | POA: Diagnosis not present

## 2023-11-21 ENCOUNTER — Ambulatory Visit: Admitting: Internal Medicine

## 2023-11-22 DIAGNOSIS — D84821 Immunodeficiency due to drugs: Secondary | ICD-10-CM | POA: Diagnosis not present

## 2023-11-26 DIAGNOSIS — M059 Rheumatoid arthritis with rheumatoid factor, unspecified: Secondary | ICD-10-CM | POA: Diagnosis not present

## 2023-11-26 DIAGNOSIS — J189 Pneumonia, unspecified organism: Secondary | ICD-10-CM | POA: Diagnosis not present

## 2023-11-26 DIAGNOSIS — R7881 Bacteremia: Secondary | ICD-10-CM | POA: Diagnosis not present

## 2023-11-26 DIAGNOSIS — J9620 Acute and chronic respiratory failure, unspecified whether with hypoxia or hypercapnia: Secondary | ICD-10-CM | POA: Diagnosis not present

## 2023-11-26 DIAGNOSIS — E1140 Type 2 diabetes mellitus with diabetic neuropathy, unspecified: Secondary | ICD-10-CM | POA: Diagnosis not present

## 2023-11-26 DIAGNOSIS — B9689 Other specified bacterial agents as the cause of diseases classified elsewhere: Secondary | ICD-10-CM | POA: Diagnosis not present

## 2023-11-26 DIAGNOSIS — J4550 Severe persistent asthma, uncomplicated: Secondary | ICD-10-CM | POA: Diagnosis not present

## 2023-11-26 DIAGNOSIS — N179 Acute kidney failure, unspecified: Secondary | ICD-10-CM | POA: Diagnosis not present

## 2023-11-26 DIAGNOSIS — T827XXD Infection and inflammatory reaction due to other cardiac and vascular devices, implants and grafts, subsequent encounter: Secondary | ICD-10-CM | POA: Diagnosis not present

## 2023-11-28 ENCOUNTER — Ambulatory Visit: Payer: Medicare PPO | Admitting: *Deleted

## 2023-11-28 VITALS — Ht 60.0 in | Wt 116.0 lb

## 2023-11-28 DIAGNOSIS — Z0000 Encounter for general adult medical examination without abnormal findings: Secondary | ICD-10-CM

## 2023-11-28 NOTE — Progress Notes (Signed)
 Subjective:   Ebony Scott is a 73 y.o. female who presents for Medicare Annual (Subsequent) preventive examination.  Visit Complete: Virtual I connected with  Ebony Scott on 11/28/23 by a audio enabled telemedicine application and verified that I am speaking with the correct person using two identifiers.  Patient Location: Home  Provider Location: Home Office  I discussed the limitations of evaluation and management by telemedicine. The patient expressed understanding and agreed to proceed.  Vital Signs: Because this visit was a virtual/telehealth visit, some criteria may be missing or patient reported. Any vitals not documented were not able to be obtained and vitals that have been documented are patient reported.  Patient Medicare AWV questionnaire was completed by the patient on 11-25-2023; I have confirmed that all information answered by patient is correct and no changes since this date.  Cardiac Risk Factors include: advanced age (>50men, >8 women)     Objective:    Today's Vitals   11/28/23 1336  Weight: 116 lb (52.6 kg)  Height: 5' (1.524 m)  PainSc: 5    Body mass index is 22.65 kg/m.     11/28/2023    1:35 PM 10/14/2023    5:01 PM 08/07/2023   11:20 AM 05/18/2023    5:00 PM 03/29/2023    6:40 PM 11/30/2022    2:00 PM 11/29/2022   10:44 PM  Advanced Directives  Does Patient Have a Medical Advance Directive? Yes No Yes Yes No No No  Type of Diplomatic Services operational officer  Living will Healthcare Power of [place];Living will     Does patient want to make changes to medical advance directive?   No - Guardian declined No - Patient declined     Copy of Healthcare Power of Attorney in Chart? Yes - validated most recent copy scanned in chart (See row information)   No - copy requested     Would patient like information on creating a medical advance directive?  No - Guardian declined   No - Patient declined Yes (Inpatient - patient requests chaplain consult  to create a medical advance directive)     Current Medications (verified) Outpatient Encounter Medications as of 11/28/2023  Medication Sig   acetaminophen  (TYLENOL ) 500 MG tablet Take 1 tablet (500 mg total) by mouth every 6 (six) hours as needed for moderate pain (pain score 4-6) or mild pain (pain score 1-3).   albuterol  (PROVENTIL ) (2.5 MG/3ML) 0.083% nebulizer solution Take 3 mLs (2.5 mg total) by nebulization every 4 (four) hours as needed for wheezing or shortness of breath.   albuterol  (VENTOLIN  HFA) 108 (90 Base) MCG/ACT inhaler Inhale 2 puffs into the lungs every 6 (six) hours as needed for wheezing or shortness of breath.   allopurinol  (ZYLOPRIM ) 100 MG tablet Take 1 tablet (100 mg total) by mouth daily.   Alpha-D-Galactosidase (BEANO PO) Take 2 tablets by mouth in the morning, at noon, and at bedtime.   Alpha-Lipoic Acid 600 MG TABS Take 600 mg by mouth in the morning.   atorvastatin  (LIPITOR) 80 MG tablet Take 1 tablet (80 mg total) by mouth every evening.   Azelastine  HCl 137 MCG/SPRAY SOLN 2 sprays once per day   Calcium -Magnesium -Vitamin D  (CITRACAL CALCIUM +D) 600-40-500 MG-MG-UNIT TB24 Take 1 tablet by mouth daily.   cetirizine  (ZYRTEC ) 10 MG tablet TAKE 1 TABLET (10 MG TOTAL) BY MOUTH DAILY AS NEEDED FOR ALLERGIES (CAN TAKE A N EXTRA DOSE DURING FLARE UPS.).   Cholecalciferol  (VITAMIN D3) 25 MCG (  1000 UT) capsule Take 1,000 Units by mouth at bedtime.   denosumab  (PROLIA ) 60 MG/ML SOSY injection Inject 60 mg into the skin every 6 (six) months.   dexlansoprazole  (DEXILANT ) 60 MG capsule Take 1 capsule (60 mg total) by mouth in the morning.   diclofenac  Sodium (VOLTAREN ) 1 % GEL Apply 1 Application topically as needed (pain).   empagliflozin  (JARDIANCE ) 10 MG TABS tablet Take 1 tablet (10 mg total) by mouth daily.   ENTRESTO  24-26 MG TAKE 1 TABLET BY MOUTH TWICE A DAY   EPINEPHRINE  0.3 mg/0.3 mL IJ SOAJ injection USE AS DIRECTED BY YOUR PHYSICIAN INTRAMUSCULARLY AS NEEDED FOR  ANAPHYLAXIS   famotidine  (PEPCID ) 20 MG tablet TAKE 1 TABLET (20 MG) BY MOUTH DAILY BEFORE LUNCH AND1 TABLET BY MOUTH EACH NIGHT AT BEDTIME   fluticasone  (FLONASE ) 50 MCG/ACT nasal spray Place 2 sprays into both nostrils daily.   Fluticasone -Umeclidin-Vilant (TRELEGY ELLIPTA ) 200-62.5-25 MCG/ACT AEPB Inhale 1 Inhalation into the lungs in the morning.   folic acid  (FOLVITE ) 1 MG tablet Take 1 tablet (1 mg total) by mouth daily.   furosemide  (LASIX ) 40 MG tablet Take 0.5 tablets (20 mg total) by mouth daily as needed for fluid or edema (Weight gain, edema >3lbs). Monday and Thursday   gabapentin  (NEURONTIN ) 300 MG capsule Take 1 capsule (300 mg total) by mouth at bedtime.   guaiFENesin  (MUCINEX ) 600 MG 12 hr tablet Take 600 mg by mouth 2 (two) times daily.   HIZENTRA  10 GM/50ML SOLN Inject 15 g into the skin once a week. 13 g Infusion   ipratropium-albuterol  (DUONEB) 0.5-2.5 (3) MG/3ML SOLN Take 3 mLs by nebulization every 6 (six) hours as needed.   Lactase (LACTOSE FAST ACTING RELIEF PO) Take 2 tablets by mouth 3 (three) times daily before meals.   montelukast  (SINGULAIR ) 10 MG tablet Take 1 tablet (10 mg total) by mouth daily after supper.   mupirocin  ointment (BACTROBAN ) 2 % Apply 1 Application topically as needed (wounds).   nystatin  (MYCOSTATIN ) 100000 UNIT/ML suspension Swish, gargle, and swallow 5 ml after Trelegy use   omeprazole (PRILOSEC) 20 MG capsule Take 20 mg by mouth daily.   predniSONE  (DELTASONE ) 5 MG tablet Take 1 tablet (5 mg total) by mouth daily with breakfast.   Prenatal Vit-Fe Fumarate-FA (PRENATAL VITAMINS PO) Take 1 tablet by mouth in the morning.   Probiotic Product (ALIGN) 4 MG CAPS Take 4 mg by mouth every evening.   Respiratory Therapy Supplies (NEBULIZER MASK ADULT) MISC 1 Device by Does not apply route as directed.   rivaroxaban  (XARELTO ) 20 MG TABS tablet Take 1 tablet (20 mg total) by mouth daily with supper.   sodium bicarbonate  650 MG tablet Take 650 mg by mouth  daily.   sodium chloride  HYPERTONIC 3 % nebulizer solution Take 4 mLs by nebulization at bedtime.   traZODone  (DESYREL ) 50 MG tablet Take 100 mg by mouth at bedtime as needed.   Facility-Administered Encounter Medications as of 11/28/2023  Medication   denosumab  (PROLIA ) injection 60 mg    Allergies (verified) Baclofen , Other, Porcine (pork) protein-containing drug products, Shellfish allergy , Shrimp extract, Tetracycline hcl, Xolair  [omalizumab ], Zoledronic  acid, Celecoxib, Dilaudid  [hydromorphone  hcl], Hydrocodone -acetaminophen , Levofloxacin , Molds & smuts, Morphine  and codeine, Oxycodone  hcl, Paroxetine, Tetracycline hcl, Oxycodone -acetaminophen , Diltiazem  hcl, Dust mite extract, Gamunex [immune globulin ], Rituximab , Rofecoxib, and Tree extract   History: Past Medical History:  Diagnosis Date   Allergy     Anemia    Angio-edema    Breast cancer (HCC) 1998   Left breast,  in remission   Bronchiectasis (HCC)    Cataract    CHF (congestive heart failure) (HCC)    Closed displaced fracture of metatarsal bone of right foot 01/20/2019   Closed fracture of fifth metatarsal bone of right foot, initial encounter 01/15/2018   Clostridium difficile colitis 01/14/2018   Coronary artery disease    Depression    some; don't take anything for it (02/20/2012), pt denies dx as of 06/02/21   Diverticulosis    DVT (deep venous thrombosis) (HCC)    Fibromyalgia 11/2011   Fracture of base of fifth metatarsal bone with routine healing, left    GAVE (gastric antral vascular ectasia)    GERD (gastroesophageal reflux disease)    Headache(784.0)    related to allergies; more at different times during the year (02/20/2012)   Hemorrhoids    Hiatal hernia    back and neck   History of blood transfusion 2023   1 unit, 5 units of Iron    Hx of adenomatous colonic polyps 04/12/2016   Hypercholesteremia    good cholesterol is high   Hypothyroidism    h/o Graves disease   IBS (irritable bowel syndrome)     Moderate persistent asthma    -FeV1 72% 2011, -IgE 102 2011, CT sinus Neg 2011   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 Cindy Setter   Sleep apnea    uses cpap nightly   Tracheobronchomalacia    Vasculitis 12/13/2017   Past Surgical History:  Procedure Laterality Date   ABDOMINAL HYSTERECTOMY N/A    Phreesia 10/21/2019   ANTERIOR AND POSTERIOR REPAIR  1990's   APPENDECTOMY     ARGON LASER APPLICATION  02/05/2021   Procedure: ARGON LASER APPLICATION;  Surgeon: Leigh Elspeth SQUIBB, MD;  Location: [facility];  Service: Gastroenterology;;   BREAST LUMPECTOMY  1998   left   BREAST SURGERY N/A    Phreesia 10/21/2019   BRONCHIAL WASHINGS  04/05/2020   Procedure: BRONCHIAL WASHINGS;  Surgeon: Kara Dorn NOVAK, MD;  Location: [facility];  Service: Pulmonary;;   CAPSULOTOMY Right 08/28/2021   Procedure: CAPSULOTOMY;  Surgeon: Tobie Franky SQUIBB, DPM;  Location: [facility];  Service: Podiatry;  Laterality: Right;   CARPOMETACARPEL (CMC) FUSION OF THUMB WITH AUTOGRAFT FROM RADIUS  ~ 2009   both thumbs (02/20/2012)   CATARACT EXTRACTION W/ INTRAOCULAR LENS  IMPLANT, BILATERAL  2012   CERVICAL DISCECTOMY  10/2001   C5-C6   CERVICAL FUSION  2003   C3-C4   CHOLECYSTECTOMY     COLONOSCOPY     CORONARY STENT INTERVENTION N/A 01/11/2021   Procedure: CORONARY STENT INTERVENTION;  Surgeon: Anner Alm ORN, MD;  Location: [facility] INVASIVE CV LAB;  Service: Cardiovascular;  Laterality: N/A;   DEBRIDEMENT TENNIS ELBOW  ?1970's   right   ESOPHAGOGASTRODUODENOSCOPY     ESOPHAGOGASTRODUODENOSCOPY (EGD) WITH PROPOFOL  N/A 02/05/2021   Procedure: ESOPHAGOGASTRODUODENOSCOPY (EGD) WITH PROPOFOL ;  Surgeon: Leigh Elspeth SQUIBB, MD;  Location: [facility];  Service:  Gastroenterology;  Laterality: N/A;   EYE SURGERY N/A    Phreesia 10/21/2019   HAMMER TOE SURGERY Right 08/28/2021   Procedure: HAMMER TOE REPAIR 2ND TOE RIGHT FOOT;  Surgeon: Tobie Franky SQUIBB, DPM;  Location: [facility];  Service: Podiatry;  Laterality: Right;  HYSTERECTOMY     I & D EXTREMITY Right 09/22/2020   Procedure: IRRIGATION AND DEBRIDEMENT RIGHT HAND AND ELBOW;  Surgeon: Josefina Chew, MD;  Location: [facility];  Service: Orthopedics;  Laterality: Right;   ICD GENERATOR REMOVAL N/A 08/21/2023   Procedure: ICD GENERATOR REMOVAL;  Surgeon: Waddell Danelle ORN, MD;  [facility];  Service: Cardiovascular;  Laterality: N/A;   ICD IMPLANT N/A 08/27/2022   Procedure: ICD IMPLANT;  Surgeon: Nancey Eulas BRAVO, MD;  Location: [facility];  Service: Cardiovascular;  Laterality: N/A;   JOINT REPLACEMENT  02/15/2021   KNEE ARTHROPLASTY  ?8009'd   ?right; w/cartilage repair (02/20/2012)   LEAD EXTRACTION N/A 08/21/2023   Procedure: LEAD EXTRACTION;  Surgeon: Waddell Danelle ORN, MD;  [facility]: [facility];  Service: Cardiovascular;  Laterality: N/A;   MASS EXCISION Left 08/28/2021   Procedure: EXCISION OF SOFT TISSUE  MASS LEFT FOOT;  Surgeon: Tobie Franky SQUIBB, DPM;  Location: [facility];  Service: Podiatry;  Laterality: Left;   NASAL SEPTUM SURGERY  1980's   POSTERIOR CERVICAL FUSION/FORAMINOTOMY  2004   failed initial fusion; rewired  anterior neck (02/20/2012)   REVERSE SHOULDER ARTHROPLASTY Right 02/16/2020   Procedure: REVERSE SHOULDER ARTHROPLASTY;  Surgeon: Josefina Chew, MD;  Location: [facility];  Service: Orthopedics;  Laterality: Right;   RIGHT/LEFT HEART CATH AND CORONARY ANGIOGRAPHY N/A 01/11/2021   Procedure: RIGHT/LEFT HEART CATH AND CORONARY ANGIOGRAPHY;  Surgeon: Anner Alm ORN, MD;  Location: [facility] INVASIVE CV LAB;  Service: Cardiovascular;  Laterality: N/A;   SPINE SURGERY N/A    Phreesia 10/21/2019   TONSILLECTOMY  ~ 1953   TRANSESOPHAGEAL ECHOCARDIOGRAM (CATH LAB)  N/A 08/12/2023   Procedure: TRANSESOPHAGEAL ECHOCARDIOGRAM;  Surgeon: Pietro Redell RAMAN, MD;  Location: [facility] INVASIVE CV LAB;  Service: Cardiovascular;  Laterality: N/A;   VESICOVAGINAL FISTULA CLOSURE W/ TAH  1988   VIDEO BRONCHOSCOPY Bilateral 08/23/2016   Procedure: VIDEO BRONCHOSCOPY WITH FLUORO;  Surgeon: Noreen Tonnie BRAVO, MD;  Location: [facility];  Service: Cardiopulmonary;  Laterality: Bilateral;   VIDEO BRONCHOSCOPY N/A 04/05/2020   Procedure: VIDEO BRONCHOSCOPY WITHOUT FLUORO;  Surgeon: Kara Dorn NOVAK, MD;  Location: [facility];  Service: Pulmonary;  Laterality: N/A;   Family History  Problem Relation Age of Onset   Allergies Mother    Heart disease Mother    Arthritis Mother    Lung cancer Mother        heavy smoker   Diabetes Mother    Cancer Mother    Miscarriages / [place] Mother    Obesity Mother    Allergies Father    Heart disease Father    Arthritis Father    Stroke Father    Alcohol  abuse Father    Hypertension Father    Heart disease Brother    Diabetes Maternal Grandfather    Colitis Daughter    Colon cancer Other        Maternal half uncle   Heart disease Brother    Drug abuse Brother    Early death Brother    Drug abuse Brother    Social History   Socioeconomic History   Marital status: Divorced    Spouse name: Not on file   Number of children: 2   Years of education: college   Highest education level: Bachelor's degree (e.g., BA, AB, BS)  Occupational History   Occupation: Disabled    Comment: Retired Dispensing optician: RETIRED  Tobacco Use  Smoking status: Never    Passive exposure: Past   Smokeless tobacco: Never   Tobacco comments:    Parents  Vaping Use   Vaping status: Never Used  Substance and Sexual Activity   Alcohol  use: Not Currently    Alcohol /week: 0.0 standard drinks of alcohol    Drug use: No   Sexual activity: Not Currently    Birth control/protection: Surgical    Comment:  Hysterectomy  Other Topics Concern   Not on file  Social History Narrative   Patient lives at home alone. Patient  divorced.    Patient has her BS degree.   Right handed.   Caffeine- sometimes coffee.      [place]:   Born in [place], [place]. She worked as a Armed forces operational officer. She has no pets currently. She does have indoor plants. Previously had mold in her home that was remediated. Carpet was removed.          Social Drivers of Corporate investment banker Strain: Low Risk  (11/28/2023)   Overall Financial Resource Strain (CARDIA)    Difficulty of Paying Living Expenses: Not very hard  Food Insecurity: No Food Insecurity (11/28/2023)   Hunger Vital Sign    Worried About Running Out of Food in the Last Year: Never true    Ran Out of Food in the Last Year: Never true  Transportation Needs: No Transportation Needs (11/28/2023)   PRAPARE - Administrator, Civil Service (Medical): No    Lack of Transportation (Non-Medical): No  Physical Activity: Inactive (11/28/2023)   Exercise Vital Sign    Days of Exercise per Week: 0 days    Minutes of Exercise per Session: 30 min  Stress: Stress Concern Present (11/28/2023)   [employer] of Occupational Health - Occupational Stress Questionnaire    Feeling of Stress: To some extent  Social Connections: Socially Isolated (11/28/2023)   Social Connection and Isolation Panel    Frequency of Communication with Friends and Family: Never    Frequency of Social Gatherings with Friends and Family: Never    Attends Religious Services: Never    Diplomatic Services operational officer: No    Attends Engineer, structural: Never    Marital Status: Divorced    Tobacco Counseling Counseling given: Not Answered Tobacco comments: Parents   Clinical Intake:  Pre-visit preparation completed: Yes  Pain : 0-10 Pain Score: 5  Pain Type: Chronic pain Pain Location: Foot Pain Orientation: Left, Right Pain Descriptors /  Indicators: Burning, Aching, Dull     Diabetes: No  How often do you need to have someone help you when you read instructions, pamphlets, or other written materials from your doctor or pharmacy?: 1 - Never  Interpreter Needed?: No  Information entered by :: Mliss Graff LPN   Activities of Daily Living    11/28/2023    1:37 PM 11/25/2023    1:01 PM  In your present state of health, do you have any difficulty performing the following activities:  Hearing? 0 0  Vision? 0 0  Difficulty concentrating or making decisions? 0 0  Walking or climbing stairs? 1 1  Dressing or bathing? 1 1  Doing errands, shopping? 1 1  Preparing Food and eating ? N N  Using the Toilet? N N  In the past six months, have you accidently leaked urine? Y Y  Do you have problems with loss of bowel control? N N  Managing your Medications? N N  Managing  your Finances? N N  Housekeeping or managing your Housekeeping? CINDERELLA CINDERELLA    Patient Care Team: [name], MD as PCP - General (Family Medicine) Lonni Slain, MD as PCP - Cardiology (Cardiology) Mealor, Eulas BRAVO, MD as PCP - Electrophysiology (Cardiology) Lonni Slain, MD as Consulting Physician (Cardiology)  Indicate any recent Medical Services you may have received from other than [facility] providers in the past year (date may be approximate).     Assessment:   This is a routine wellness examination for Yan.  Hearing/Vision screen Hearing Screening - Comments:: No trouble hearing Vision Screening - Comments:: Up to date [facility] st. Unsure of name   Goals Addressed             This Visit's Progress    Patient Stated       Taking day by day       Depression Screen    11/28/2023    1:46 PM 10/10/2023    3:01 PM 05/29/2023   11:13 AM 01/21/2023   11:00 AM 11/22/2022    2:22 PM 07/13/2021    3:22 PM 08/05/2020    3:09 PM  PHQ 2/9 Scores  PHQ - 2 Score   0 0 0 0 0  Exception Documentation Patient refusal Patient refusal          Fall Risk    11/28/2023    1:37 PM 11/25/2023    1:01 PM 10/10/2023    3:01 PM 09/03/2023    3:37 PM 01/21/2023   11:00 AM  Fall Risk   Falls in the past year? 1 1 1  0 0  Number falls in past yr:  0 0 0 0  Injury with Fall? 1 1 0 0 0  Risk for fall due to : Impaired balance/gait;Impaired mobility  History of fall(s);Impaired balance/gait;Impaired mobility Impaired mobility No Fall Risks  Follow up Falls evaluation completed;Education provided;Falls prevention discussed  Falls evaluation completed;Education provided Falls evaluation completed Falls prevention discussed    MEDICARE RISK AT HOME: Medicare Risk at Home Any stairs in or around the home?: Yes If so, are there any without handrails?: No Home free of loose throw rugs in walkways, pet beds, electrical cords, etc?: Yes Adequate lighting in your home to reduce risk of falls?: Yes Life alert?: Yes Use of a cane, walker or w/c?: Yes Grab bars in the bathroom?: Yes Shower chair or bench in shower?: Yes Elevated toilet seat or a handicapped toilet?: Yes  TIMED UP AND GO:  Was the test performed?  No    Cognitive Function:        11/28/2023    1:38 PM 11/22/2022    2:23 PM 07/13/2021    3:29 PM  6CIT Screen  What Year? 0 points 0 points 0 points  What month? 0 points 0 points 0 points  What time? 0 points 0 points 0 points  Count back from 20 0 points 0 points 0 points  Months in reverse 0 points 0 points 0 points  Repeat phrase 0 points 0 points 0 points  Total Score 0 points 0 points 0 points    Immunizations Immunization History  Administered Date(s) Administered   DTaP 08/18/2013   Fluad Quad(high Dose 65+) 10/16/2018, 11/20/2019, 11/03/2020   Hepatitis A 09/04/2007, 03/02/2008   Hepatitis B 01/07/1985, 02/06/1985, 08/17/1985   INFLUENZA, HIGH DOSE SEASONAL PF 11/09/2015, 11/27/2017   Influenza Split 11/13/2010, 11/22/2011, 10/20/2012   Influenza Whole 11/14/2009, 11/21/2011   Influenza, Quadrivalent,  Recombinant, Inj, Pf  11/20/2019   Influenza,inj,Quad PF,6+ Mos 11/06/2013, 11/09/2014, 11/06/2016   Influenza-Unspecified 11/06/2021, 10/12/2022   Meningococcal Conjugate 09/04/2007   Moderna Covid-19 Fall Seasonal Vaccine 59yrs & older 01/08/2022   PFIZER(Purple Top)SARS-COV-2 Vaccination 03/12/2019, 04/01/2019, 11/20/2019, 12/16/2019, 06/29/2020   Pfizer Covid-19 Vaccine Bivalent Booster 37yrs & up 03/02/2021   Pneumococcal Conjugate-13 05/18/2013   Pneumococcal Polysaccharide-23 01/05/1994, 11/28/2011, 05/27/2017   Respiratory Syncytial Virus Vaccine,Recomb Aduvanted(Arexvy) 11/06/2021   Td 07/24/1995, 03/16/2005   Tdap 08/18/2013   Zoster Recombinant(Shingrix) 09/18/2017   Zoster, Live 03/02/2008    TDAP status: Due, Education has been provided regarding the importance of this vaccine. Advised may receive this vaccine at local pharmacy or Health Dept. Aware to provide a copy of the vaccination record if obtained from local pharmacy or Health Dept. Verbalized acceptance and understanding.  Flu Vaccine status: Due, Education has been provided regarding the importance of this vaccine. Advised may receive this vaccine at local pharmacy or Health Dept. Aware to provide a copy of the vaccination record if obtained from local pharmacy or Health Dept. Verbalized acceptance and understanding.  Pneumococcal vaccine status: Up to date  Covid-19 vaccine status: Information provided on how to obtain vaccines.   Qualifies for Shingles Vaccine? Yes   Zostavax completed No   Shingrix Completed?: No.    Education has been provided regarding the importance of this vaccine. Patient has been advised to call insurance company to determine out of pocket expense if they have not yet received this vaccine. Advised may also receive vaccine at local pharmacy or Health Dept. Verbalized acceptance and understanding.  Screening Tests Health Maintenance  Topic Date Due   FOOT EXAM  Never done   Diabetic kidney  evaluation - Urine ACR  Never done   Zoster Vaccines- Shingrix (2 of 2) 11/13/2017   DTaP/Tdap/Td (5 - Td or Tdap) 08/19/2023   Influenza Vaccine  09/20/2023   COVID-19 Vaccine (8 - 2025-26 season) 10/21/2023   HEMOGLOBIN A1C  02/06/2024   Mammogram  04/17/2024   OPHTHALMOLOGY EXAM  04/17/2024   Diabetic kidney evaluation - eGFR measurement  09/30/2024   Medicare Annual Wellness (AWV)  11/27/2024   Pneumococcal Vaccine: 50+ Years  Completed   DEXA SCAN  Completed   Hepatitis C Screening  Completed   Meningococcal B Vaccine  Aged Out   Hepatitis B Vaccines 19-59 Average Risk  Discontinued   Colonoscopy  Discontinued    Health Maintenance  Health Maintenance Due  Topic Date Due   FOOT EXAM  Never done   Diabetic kidney evaluation - Urine ACR  Never done   Zoster Vaccines- Shingrix (2 of 2) 11/13/2017   DTaP/Tdap/Td (5 - Td or Tdap) 08/19/2023   Influenza Vaccine  09/20/2023   COVID-19 Vaccine (8 - 2025-26 season) 10/21/2023    Colorectal cancer screening: No longer required.   Mammogram status: Completed  . Repeat every year  Bone Density status: Completed 2024. Results reflect: Bone density results: OSTEOPOROSIS. Repeat every 2 years.  Lung Cancer Screening: (Low Dose CT Chest recommended if Age 71-80 years, 20 pack-year currently smoking OR have quit w/in 15years.) does not qualify.   Lung Cancer Screening Referral:   Additional Screening:  Hepatitis C Screening: does not qualify; Completed 2019  Vision Screening: Recommended annual ophthalmology exams for early detection of glaucoma and other disorders of the eye. Is the patient up to date with their annual eye exam?  Yes  Who is the provider or what is the name of the office in which the  patient attends annual eye exams? Unsure of name.    If pt is not established with a provider, would they like to be referred to a provider to establish care? No .   Dental Screening: Recommended annual dental exams for proper oral  hygiene   Community Resource Referral / Chronic Care Management: CRR required this visit?  No   CCM required this visit?  No     Plan:     I have personally reviewed and noted the following in the patient's chart:   Medical and social history Use of alcohol , tobacco or illicit drugs  Current medications and supplements including opioid prescriptions. Patient is not currently taking opioid prescriptions. Functional ability and status Nutritional status Physical activity Advanced directives List of other physicians Hospitalizations, surgeries, and ER visits in previous 12 months Vitals Screenings to include cognitive, depression, and falls Referrals and appointments  In addition, I have reviewed and discussed with patient certain preventive protocols, quality metrics, and best practice recommendations. A written personalized care plan for preventive services as well as general preventive health recommendations were provided to patient.     Mliss Graff, LPN   [id number]/[id number]   After Visit Summary: (MyChart) Due to this being a telephonic visit, the after visit summary with patients personalized plan was offered to patient via MyChart   Nurse Notes:

## 2023-11-29 DIAGNOSIS — R7881 Bacteremia: Secondary | ICD-10-CM | POA: Diagnosis not present

## 2023-11-29 DIAGNOSIS — M059 Rheumatoid arthritis with rheumatoid factor, unspecified: Secondary | ICD-10-CM | POA: Diagnosis not present

## 2023-11-29 DIAGNOSIS — J9620 Acute and chronic respiratory failure, unspecified whether with hypoxia or hypercapnia: Secondary | ICD-10-CM | POA: Diagnosis not present

## 2023-11-29 DIAGNOSIS — B9689 Other specified bacterial agents as the cause of diseases classified elsewhere: Secondary | ICD-10-CM | POA: Diagnosis not present

## 2023-11-29 DIAGNOSIS — T827XXD Infection and inflammatory reaction due to other cardiac and vascular devices, implants and grafts, subsequent encounter: Secondary | ICD-10-CM | POA: Diagnosis not present

## 2023-11-29 DIAGNOSIS — J189 Pneumonia, unspecified organism: Secondary | ICD-10-CM | POA: Diagnosis not present

## 2023-11-29 DIAGNOSIS — N179 Acute kidney failure, unspecified: Secondary | ICD-10-CM | POA: Diagnosis not present

## 2023-11-29 DIAGNOSIS — E1140 Type 2 diabetes mellitus with diabetic neuropathy, unspecified: Secondary | ICD-10-CM | POA: Diagnosis not present

## 2023-11-29 DIAGNOSIS — J4550 Severe persistent asthma, uncomplicated: Secondary | ICD-10-CM | POA: Diagnosis not present

## 2023-12-03 ENCOUNTER — Ambulatory Visit: Admitting: Allergy and Immunology

## 2023-12-03 VITALS — BP 110/60 | HR 75 | Ht 60.0 in | Wt 123.3 lb

## 2023-12-03 DIAGNOSIS — D801 Nonfamilial hypogammaglobulinemia: Secondary | ICD-10-CM | POA: Diagnosis not present

## 2023-12-03 DIAGNOSIS — B9689 Other specified bacterial agents as the cause of diseases classified elsewhere: Secondary | ICD-10-CM | POA: Diagnosis not present

## 2023-12-03 DIAGNOSIS — J9620 Acute and chronic respiratory failure, unspecified whether with hypoxia or hypercapnia: Secondary | ICD-10-CM | POA: Diagnosis not present

## 2023-12-03 DIAGNOSIS — J189 Pneumonia, unspecified organism: Secondary | ICD-10-CM | POA: Diagnosis not present

## 2023-12-03 DIAGNOSIS — J4550 Severe persistent asthma, uncomplicated: Secondary | ICD-10-CM | POA: Diagnosis not present

## 2023-12-03 DIAGNOSIS — M059 Rheumatoid arthritis with rheumatoid factor, unspecified: Secondary | ICD-10-CM | POA: Diagnosis not present

## 2023-12-03 DIAGNOSIS — R7881 Bacteremia: Secondary | ICD-10-CM | POA: Diagnosis not present

## 2023-12-03 DIAGNOSIS — N179 Acute kidney failure, unspecified: Secondary | ICD-10-CM | POA: Diagnosis not present

## 2023-12-03 DIAGNOSIS — T827XXD Infection and inflammatory reaction due to other cardiac and vascular devices, implants and grafts, subsequent encounter: Secondary | ICD-10-CM | POA: Diagnosis not present

## 2023-12-03 DIAGNOSIS — E1140 Type 2 diabetes mellitus with diabetic neuropathy, unspecified: Secondary | ICD-10-CM | POA: Diagnosis not present

## 2023-12-03 NOTE — Patient Instructions (Addendum)
  1. Complete immunoglobulin infusion - IVIG Asceniv   2. Check IgA/G/M prior to 4th dose of Asceniv. Adjust dose???  3. Return to clinic in 12 weeks or earlier if problem  4. Influenza = Tamiflu . Covid = Paxlovid

## 2023-12-03 NOTE — Progress Notes (Unsigned)
 [place] - [place] - [place] - [place] - [place]   Follow-up Note  Referring Provider: [name], MD Primary Provider: [name], MD Date of Office Visit: 12/03/2023  Subjective:   Ebony Scott (DOB: 29-Nov-1950) is a 73 y.o. 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  I last saw her in this clinic 27 August 2023.  The issue that we see Verdella for at this point in time is her history of hypogammaglobulinemia.  We have giving her a hyperimmune intravenous immunoglobulin infusion because of her history of developing pneumonia and sepsis on standard IVIG.  She has had 2 doses to date and she is receiving her third dose at the end of this month.  She has not had any infections since I have last seen her in this clinic and has not required any antibiotics.  Allergies as of 12/03/2023       Reactions   Baclofen  Anaphylaxis, Other (See Comments)   Altered mental status requiring a 3-day hospital stay- patient became unresponsive Put me in a coma for five days, Altered mental status requiring a 3-day hospital stay- patient became unresponsive   Other Shortness Of Breath, Other (See Comments)   Grass and weeds sneezing; filled sinuses (02/20/2012)   Porcine (pork) Protein-containing Drug Products Anaphylaxis   Shellfish Allergy  Anaphylaxis, Shortness Of Breath, Itching, Rash   Shrimp Extract Anaphylaxis, Other (See Comments)   ALL SHELLFISH   Tetracycline Hcl Nausea And Vomiting   Xolair  [omalizumab ] Other (See Comments)   Caused Blood clot   Zoledronic  Acid Other (See Comments)   Reclast - Fever, Put in hospital, dr said it was a reaction  from a reaction  Fever, inability to walk, Put in hospital Fever, Put in hospital, dr said it was a reaction from a reaction , Other reaction(s): Unknown   Celecoxib Swelling   Feet swelling   Dilaudid  [hydromorphone  Hcl] Itching   Hydrocodone -acetaminophen  Itching   Levofloxacin  Other (See Comments)   GI upset   Molds & Smuts Cough   Morphine  And Codeine Hives, Itching   Oxycodone  Hcl Nausea And Vomiting   GI Intolerance   Paroxetine Nausea And Vomiting, Other (See Comments)   GI Intolerance also   Tetracycline Hcl Other (See Comments)   GI Intolerance   Oxycodone -acetaminophen  Itching   Diltiazem  Hcl Swelling   Dust Mite Extract Other (See Comments), Cough   sneezing (02/20/2012) too   Gamunex [immune Globulin ] Itching, Rash   Tolerates hizentra     Rituximab  Other (See Comments)   Anemia    Rofecoxib Swelling   Vioxx- feet swelling   Tree Extract Itching   tested and told I was allergic to it; never experienced a reaction to it (02/20/2012)        Medication List    acetaminophen  500 MG tablet Commonly known as: TYLENOL  Take 1 tablet (500 mg total) by mouth every 6 (six) hours as needed for moderate pain (pain score 4-6) or mild pain (pain score 1-3).   albuterol  108 (90 Base) MCG/ACT inhaler Commonly known as: VENTOLIN  HFA Inhale 2 puffs into the lungs every 6 (six) hours as needed for wheezing or shortness of breath.   albuterol  (2.5 MG/3ML) 0.083% nebulizer solution Commonly known as: PROVENTIL  Take  3 mLs (2.5 mg total) by nebulization every 4 (four) hours as needed for wheezing or shortness of breath.   Align 4 MG Caps Take 4 mg by mouth every evening.   allopurinol  100 MG tablet Commonly known as: ZYLOPRIM  Take 1 tablet (100 mg total) by mouth daily.   Alpha-Lipoic Acid 600 MG Tabs Take 600 mg by mouth in the morning.   atorvastatin  80 MG tablet Commonly known as: LIPITOR Take 1 tablet (80 mg total) by mouth every evening.   Azelastine  HCl 137  MCG/SPRAY Soln 2 sprays once per day   BEANO PO Take 2 tablets by mouth in the morning, at noon, and at bedtime.   cetirizine  10 MG tablet Commonly known as: ZYRTEC  TAKE 1 TABLET (10 MG TOTAL) BY MOUTH DAILY AS NEEDED FOR ALLERGIES (CAN TAKE A N EXTRA DOSE DURING FLARE UPS.).   CITRACAL CALCIUM +D 600-40-500 MG-MG-UNIT Tb24 Generic drug: Calcium -Magnesium -Vitamin D  Take 1 tablet by mouth daily.   denosumab  60 MG/ML Sosy injection Commonly known as: PROLIA  Inject 60 mg into the skin every 6 (six) months.   dexlansoprazole  60 MG capsule Commonly known as: Dexilant  Take 1 capsule (60 mg total) by mouth in the morning.   empagliflozin  10 MG Tabs tablet Commonly known as: Jardiance  Take 1 tablet (10 mg total) by mouth daily.   Entresto  24-26 MG Generic drug: sacubitril -valsartan  TAKE 1 TABLET BY MOUTH TWICE A DAY   EPINEPHrine  0.3 mg/0.3 mL Soaj injection Commonly known as: EPI-PEN USE AS DIRECTED BY YOUR PHYSICIAN INTRAMUSCULARLY AS NEEDED FOR ANAPHYLAXIS   famotidine  20 MG tablet Commonly known as: PEPCID  TAKE 1 TABLET (20 MG) BY MOUTH DAILY BEFORE LUNCH AND1 TABLET BY MOUTH EACH NIGHT AT BEDTIME   fluticasone  50 MCG/ACT nasal spray Commonly known as: FLONASE  Place 2 sprays into both nostrils daily.   folic acid  1 MG tablet Commonly known as: FOLVITE  Take 1 tablet (1 mg total) by mouth daily.   furosemide  40 MG tablet Commonly known as: LASIX  Take 0.5 tablets (20 mg total) by mouth daily as needed for fluid or edema (Weight gain, edema >3lbs). Monday and Thursday   gabapentin  300 MG capsule Commonly known as: NEURONTIN  Take 1 capsule (300 mg total) by mouth at bedtime.   guaiFENesin  600 MG 12 hr tablet Commonly known as: MUCINEX  Take 600 mg by mouth 2 (two) times daily.   ipratropium-albuterol  0.5-2.5 (3) MG/3ML Soln Commonly known as: DUONEB Take 3 mLs by nebulization every 6 (six) hours as needed.   LACTOSE FAST ACTING RELIEF PO Take 2 tablets by mouth 3  (three) times daily before meals.   montelukast  10 MG tablet Commonly known as: SINGULAIR  Take 1 tablet (10 mg total) by mouth daily after supper.   mupirocin  ointment 2 % Commonly known as: BACTROBAN  Apply 1 Application topically as needed (wounds).   Nebulizer Mask Adult Misc 1 Device by Does not apply route as directed.   nystatin  100000 UNIT/ML suspension Commonly known as: MYCOSTATIN  Swish, gargle, and swallow 5 ml after Trelegy use   omeprazole 20 MG capsule Commonly known as: PRILOSEC Take 20 mg by mouth daily.   predniSONE  5 MG tablet Commonly known as: DELTASONE  Take 1 tablet (5 mg total) by mouth daily with breakfast.   PRENATAL VITAMINS PO Take 1 tablet by mouth in the morning.   rivaroxaban  20 MG Tabs tablet Commonly known as: Xarelto  Take 1 tablet (20 mg total) by mouth daily with supper.   sodium bicarbonate  650 MG tablet Take 650 mg  by mouth daily.   sodium chloride  HYPERTONIC 3 % nebulizer solution Take 4 mLs by nebulization at bedtime.   traZODone  50 MG tablet Commonly known as: DESYREL  Take 100 mg by mouth at bedtime as needed.   Trelegy Ellipta  200-62.5-25 MCG/ACT Aepb Generic drug: Fluticasone -Umeclidin-Vilant Inhale 1 Inhalation into the lungs in the morning.   Vitamin D3 25 MCG (1000 UT) Caps Take 1,000 Units by mouth at bedtime.   Voltaren  1 % Gel Generic drug: diclofenac  Sodium Apply 1 Application topically as needed (pain).    Past Medical History:  Diagnosis Date   Allergy     Anemia    Angio-edema    Breast cancer (HCC) 1998   Left breast, in remission   Bronchiectasis (HCC)    Cataract    CHF (congestive heart failure) (HCC)    Closed displaced fracture of metatarsal bone of right foot 01/20/2019   Closed fracture of fifth metatarsal bone of right foot, initial encounter 01/15/2018   Clostridium difficile colitis 01/14/2018   Coronary artery disease    Depression    some; don't take anything for it (02/20/2012), pt denies  dx as of 06/02/21   Diverticulosis    DVT (deep venous thrombosis) (HCC)    Fibromyalgia 11/2011   Fracture of base of fifth metatarsal bone with routine healing, left    GAVE (gastric antral vascular ectasia)    GERD (gastroesophageal reflux disease)    Headache(784.0)    related to allergies; more at different times during the year (02/20/2012)   Hemorrhoids    Hiatal hernia    back and neck   History of blood transfusion 2023   1 unit, 5 units of Iron    Hx of adenomatous colonic polyps 04/12/2016   Hypercholesteremia    good cholesterol is high   Hypothyroidism    h/o Graves disease   IBS (irritable bowel syndrome)    Moderate persistent asthma    -FeV1 72% 2011, -IgE 102 2011, CT sinus Neg 2011   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 Cindy Setter   Sleep apnea    uses cpap nightly   Tracheobronchomalacia    Vasculitis 12/13/2017    Past Surgical History:  Procedure Laterality Date   ABDOMINAL HYSTERECTOMY N/A    [employer]reesia 10/21/2019   ANTERIOR AND POSTERIOR REPAIR  1990's   APPENDECTOMY     ARGON LASER APPLICATION  02/05/2021   Procedure: ARGON LASER APPLICATION;  Surgeon: Leigh Elspeth SQUIBB, MD;  Location: [facility];  Service: Gastroenterology;;   BREAST LUMPECTOMY  1998   left   BREAST SURGERY N/A    [employer]reesia 10/21/2019   BRONCHIAL WASHINGS  04/05/2020   Procedure: BRONCHIAL WASHINGS;  Surgeon: Kara Dorn NOVAK, MD;  Location: [facility];  Service: Pulmonary;;   CAPSULOTOMY Right 08/28/2021   Procedure: CAPSULOTOMY;  Surgeon: Tobie Franky SQUIBB, DPM;  Location: [facility];  Service: Podiatry;  Laterality: Right;   CARPOMETACARPEL (CMC) FUSION OF THUMB WITH AUTOGRAFT FROM RADIUS  ~ 2009   both thumbs (02/20/2012)   CATARACT  EXTRACTION W/ INTRAOCULAR LENS  IMPLANT, BILATERAL  2012   CERVICAL DISCECTOMY  10/2001   C5-C6   CERVICAL FUSION  2003   C3-C4   CHOLECYSTECTOMY     COLONOSCOPY  CORONARY STENT INTERVENTION N/A 01/11/2021   Procedure: CORONARY STENT INTERVENTION;  Surgeon: Anner Alm ORN, MD;  Location: [facility] INVASIVE CV LAB;  Service: Cardiovascular;  Laterality: N/A;   DEBRIDEMENT TENNIS ELBOW  ?1970's   right   ESOPHAGOGASTRODUODENOSCOPY     ESOPHAGOGASTRODUODENOSCOPY (EGD) WITH PROPOFOL  N/A 02/05/2021   Procedure: ESOPHAGOGASTRODUODENOSCOPY (EGD) WITH PROPOFOL ;  Surgeon: Leigh Elspeth SQUIBB, MD;  Location: [facility];  Service: Gastroenterology;  Laterality: N/A;   EYE SURGERY N/A    Phreesia 10/21/2019   HAMMER TOE SURGERY Right 08/28/2021   Procedure: HAMMER TOE REPAIR 2ND TOE RIGHT FOOT;  Surgeon: Tobie Franky SQUIBB, DPM;  Location: [facility];  Service: Podiatry;  Laterality: Right;   HYSTERECTOMY     I & D EXTREMITY Right 09/22/2020   Procedure: IRRIGATION AND DEBRIDEMENT RIGHT HAND AND ELBOW;  Surgeon: Josefina Chew, MD;  Location: [facility];  Service: Orthopedics;  Laterality: Right;   ICD GENERATOR REMOVAL N/A 08/21/2023   Procedure: ICD GENERATOR REMOVAL;  Surgeon: Waddell Danelle ORN, MD;  Location: [facility] INVAS[facility] CV LAB;  Service: Cardiovascular;  Laterality: N/A;   ICD IMPLANT N/A 08/27/2022   Procedure: ICD IMPLANT;  Surgeon: Nancey Eulas BRAVO, MD;  Location: [facility];  Service: Cardiovascular;  Laterality: N/A;   JOINT REPLACEMENT  02/15/2021   KNEE ARTHROPLASTY  ?8009'd   ?right; w/cartilage repair (02/20/2012)   LEAD EXTRACTION N/A 08/21/2023   Procedure: LEAD EXTRACTION;  Surgeon: Waddell Danelle ORN, MD;  [facility]: [facility];  Service: Cardiovascular;  Laterality: N/A;   MASS EXCISION Left 08/28/2021   Procedure: EXCISION OF SOFT TISSUE  MASS LEFT FOOT;  Surgeon: Tobie Franky SQUIBB, DPM;  Location: [facility];  Service: Podiatry;  Laterality: Left;   NASAL SEPTUM SURGERY  1980's    POSTERIOR CERVICAL FUSION/FORAMINOTOMY  2004   failed initial fusion; rewired  anterior neck (02/20/2012)   REVERSE SHOULDER ARTHROPLASTY Right 02/16/2020   Procedure: REVERSE SHOULDER ARTHROPLASTY;  Surgeon: Josefina Chew, MD;  Location: [facility];  Service: Orthopedics;  Laterality: Right;   RIGHT/LEFT HEART CATH AND CORONARY ANGIOGRAPHY N/A 01/11/2021   Procedure: RIGHT/LEFT HEART CATH AND CORONARY ANGIOGRAPHY;  Surgeon: Anner Alm ORN, MD;  Location[facility]VAS[facility] CV LAB;  Service: Cardiovascular;  Laterality: N/A;   SPINE SURGERY N/A    Phreesia 10/21/2019   TONSILLECTOMY  ~ 1953   TRANSESOPHAGEAL ECHOCARDIOGRAM (CATH LAB) N/A 08/12/2023   Procedure: TRANSESOPHAGEAL ECHOCARDIOGRAM;  Surgeon: Pietro Redell RAMAN, MD;  Location: [facility] INVASIVE CV LAB;  Service: Cardiovascular;  Laterality: N/A;   VESICOVAGINAL FISTULA CLOSURE W/ TAH  1988   VIDEO BRONCHOSCOPY Bilateral 08/23/2016   Procedure: VIDEO BRONCHOSCOPY WITH FLUORO;  Surgeon: Noreen Tonnie BRAVO, MD;  Location: [facility];  Service: Cardiopulmonary;  Laterality: Bilateral;   VIDEO BRONCHOSCOPY N/A 04/05/2020   Procedure: VIDEO BRONCHOSCOPY WITHOUT FLUORO;  Surgeon: Kara Dorn NOVAK, MD;  Location: [facility];  Service: Pulmonary;  Laterality: N/A;    Review of systems negative except as noted in HPI / PMHx or noted below:  Review of Systems  Constitutional: Negative.   HENT: Negative.    Eyes: Negative.   Respiratory: Negative.    Cardiovascular: Negative.   Gastrointestinal: Negative.   Genitourinary: Negative.   Musculoskeletal: Negative.   Skin: Negative.   Neurological: Negative.   Endo/Heme/Allergies: Negative.   Psychiatric/Behavioral: Negative.       Objective:   Vitals:   12/03/23 1605  BP: 110/60  Pulse: 75  SpO2: 95%   Height: 5' (152.4 cm)  Weight: 123 lb 4.8 oz (55.9 kg)   Physical Exam Constitutional:      Appearance: She is not diaphoretic.  HENT:     Head: Normocephalic.     Right Ear:  Tympanic membrane, ear canal and external ear normal.     Left Ear: Tympanic membrane, ear canal and external ear normal.     Nose: Nose normal. No mucosal edema or rhinorrhea.     Mouth/Throat:     Pharynx: Uvula midline. No oropharyngeal exudate.  Eyes:     Conjunctiva/sclera: Conjunctivae normal.  Neck:     Thyroid : No thyromegaly.     Trachea: Trachea normal. No tracheal tenderness or tracheal deviation.  Cardiovascular:     Rate and Rhythm: Normal rate and regular rhythm.     Heart sounds: Normal heart sounds, S1 normal and S2 normal. No murmur heard. Pulmonary:     Effort: No respiratory distress.     Breath sounds: Normal breath sounds. No stridor. No wheezing or rales.  Lymphadenopathy:     Head:     Right side of head: No tonsillar adenopathy.     Left side of head: No tonsillar adenopathy.     Cervical: No cervical adenopathy.  Skin:    Findings: No erythema or rash.     Nails: There is no clubbing.  Neurological:     Mental Status: She is alert.     Diagnostics: none   Assessment and Plan:   1. Hypogammaglobulinemia    1. Complete immunoglobulin infusion - IVIG Asceniv   2. Check IgA/G/M prior to 4th dose of Asceniv. Adjust dose???  3. Return to clinic in 12 weeks or earlier if problem  4. Influenza = Tamiflu . Covid = Paxlovid  Javonda appears to be doing well using her hyperimmune immunoglobulin infusions and we will check her immunoglobulin levels after her fourth dose of Asceniv and adjust the dose depending on her level.  I will see her back in this clinic in 12 weeks or earlier if there is a problem.    Camellia Denis, MD Allergy  / Immunology [facility]

## 2023-12-04 ENCOUNTER — Encounter: Payer: Self-pay | Admitting: Allergy and Immunology

## 2023-12-04 NOTE — Addendum Note (Signed)
 Addended by: OTHA MADELIN HERO on: 12/04/2023 08:26 AM   Modules accepted: Orders

## 2023-12-04 NOTE — Progress Notes (Unsigned)
  Electrophysiology Office Note:   ID:  Ebony Scott, Ebony Scott Jun 26, 1950, MRN [id number]  Primary Cardiologist: Shelda Bruckner, MD Electrophysiologist: Eulas FORBES Furbish, MD  {Click to update primary MD,subspecialty MD or APP then REFRESH:1}    History of Present Illness:   Ebony Scott is a 73 y.o. female with h/o CHF with reduced EF, CAD s/p LAD DES in 12/2020, tracheobronchomalacia, DVT, obstructive sleep  seen today for routine electrophysiology followup.   Underwent extraction 08/21/2023 in the setting of Enterococcal sepsis.  Given device infection, severe pulmonary disease with immune deficiency, and no history of arrhythmia, Shared decision was made to not pursue another ICD.   Since last being seen in our clinic the patient reports doing ***.  she denies chest pain, palpitations, dyspnea, PND, orthopnea, nausea, vomiting, dizziness, syncope, edema, weight gain, or early satiety.   Review of systems complete and found to be negative unless listed in HPI.   EP Information / Studies Reviewed:    EKG is not ordered today. EKG from 09/25/2023 reviewed which showed NSR at 71 bpm        Arrhythmia/Device History MERLIN ICD ST JUDE (DEVICE EXPLANTED 08/21/23)   Physical Exam:   VS:  There were no vitals taken for this visit.   Wt Readings from Last 3 Encounters:  12/03/23 123 lb 4.8 oz (55.9 kg)  11/28/23 116 lb (52.6 kg)  10/10/23 120 lb 6.4 oz (54.6 kg)     GEN: No acute distress *** NECK: No JVD; No carotid bruits CARDIAC: {EPRHYTHM:28826}, no murmurs, rubs, gallops RESPIRATORY:  Clear to auscultation without rales, wheezing or rhonchi  ABDOMEN: Soft, non-tender, non-distended EXTREMITIES:  {EDEMA LEVEL:28147::No} edema; No deformity   ASSESSMENT AND PLAN:    Chronic systolic CHF    euvolemic today EF 25-30% despite GDMT. No h/o ICD therapies No plans to re-implant at this juncture  CAD No s/s of ischemia.      DM2 CKDIII HLD Per PCP.   Disposition:   Follow up  with EP Team AS NEEDED with no plans to re-implant. Chronic follow up with gen cards for CHF and CAD.    Signed, Ozell Prentice Passey, PA-C

## 2023-12-05 ENCOUNTER — Encounter: Payer: Self-pay | Admitting: Student

## 2023-12-05 ENCOUNTER — Ambulatory Visit: Admitting: Internal Medicine

## 2023-12-05 ENCOUNTER — Ambulatory Visit: Attending: Cardiology | Admitting: Student

## 2023-12-05 VITALS — BP 92/48 | HR 67 | Ht 60.0 in | Wt 123.6 lb

## 2023-12-05 DIAGNOSIS — E785 Hyperlipidemia, unspecified: Secondary | ICD-10-CM | POA: Diagnosis not present

## 2023-12-05 DIAGNOSIS — I5020 Unspecified systolic (congestive) heart failure: Secondary | ICD-10-CM | POA: Diagnosis not present

## 2023-12-05 DIAGNOSIS — I25118 Atherosclerotic heart disease of native coronary artery with other forms of angina pectoris: Secondary | ICD-10-CM | POA: Diagnosis not present

## 2023-12-05 DIAGNOSIS — I5042 Chronic combined systolic (congestive) and diastolic (congestive) heart failure: Secondary | ICD-10-CM | POA: Diagnosis not present

## 2023-12-05 DIAGNOSIS — I429 Cardiomyopathy, unspecified: Secondary | ICD-10-CM | POA: Diagnosis not present

## 2023-12-05 NOTE — Patient Instructions (Signed)
 Medication Instructions:  Your physician recommends that you continue on your current medications as directed. Please refer to the Current Medication list given to you today.  *If you need a refill on your cardiac medications before your next appointment, please call your pharmacy*  Lab Work: BMET-TODAY If you have labs (blood work) drawn today and your tests are completely normal, you will receive your results only by: MyChart Message (if you have MyChart) OR A paper copy in the mail If you have any lab test that is abnormal or we need to change your treatment, we will call you to review the results.  Follow-Up: At [facility], you and your health needs are our priority.  As part of our continuing mission to provide you with exceptional heart care, our providers are all part of one team.  This team includes your primary Cardiologist (physician) and Advanced Practice Providers or APPs (Physician Assistants and Nurse Practitioners) who all work together to provide you with the care you need, when you need it.  Your next appointment:   2-3 month(s)  Provider:   Shelda Bruckner, MD or APP  Follow up as needed with EP

## 2023-12-06 ENCOUNTER — Ambulatory Visit: Payer: Self-pay | Admitting: Student

## 2023-12-06 LAB — BASIC METABOLIC PANEL WITH GFR
BUN/Creatinine Ratio: 21 (ref 12–28)
BUN: 30 mg/dL — ABNORMAL HIGH (ref 8–27)
CO2: 18 mmol/L — ABNORMAL LOW (ref 20–29)
Calcium: 10 mg/dL (ref 8.7–10.3)
Chloride: 107 mmol/L — ABNORMAL HIGH (ref 96–106)
Creatinine, Ser: 1.44 mg/dL — ABNORMAL HIGH (ref 0.57–1.00)
Glucose: 176 mg/dL — ABNORMAL HIGH (ref 70–99)
Potassium: 4.7 mmol/L (ref 3.5–5.2)
Sodium: 144 mmol/L (ref 134–144)
eGFR: 38 mL/min/1.73 — ABNORMAL LOW (ref >59)

## 2023-12-13 DIAGNOSIS — M069 Rheumatoid arthritis, unspecified: Secondary | ICD-10-CM | POA: Diagnosis not present

## 2023-12-13 DIAGNOSIS — I429 Cardiomyopathy, unspecified: Secondary | ICD-10-CM | POA: Diagnosis not present

## 2023-12-13 DIAGNOSIS — I130 Hypertensive heart and chronic kidney disease with heart failure and stage 1 through stage 4 chronic kidney disease, or unspecified chronic kidney disease: Secondary | ICD-10-CM | POA: Diagnosis not present

## 2023-12-13 DIAGNOSIS — E785 Hyperlipidemia, unspecified: Secondary | ICD-10-CM | POA: Diagnosis not present

## 2023-12-13 DIAGNOSIS — J479 Bronchiectasis, uncomplicated: Secondary | ICD-10-CM | POA: Diagnosis not present

## 2023-12-13 DIAGNOSIS — I509 Heart failure, unspecified: Secondary | ICD-10-CM | POA: Diagnosis not present

## 2023-12-13 DIAGNOSIS — N1831 Chronic kidney disease, stage 3a: Secondary | ICD-10-CM | POA: Diagnosis not present

## 2023-12-13 DIAGNOSIS — E1140 Type 2 diabetes mellitus with diabetic neuropathy, unspecified: Secondary | ICD-10-CM | POA: Diagnosis not present

## 2023-12-13 DIAGNOSIS — M1990 Unspecified osteoarthritis, unspecified site: Secondary | ICD-10-CM | POA: Diagnosis not present

## 2023-12-20 ENCOUNTER — Other Ambulatory Visit: Payer: Self-pay | Admitting: Allergy and Immunology

## 2023-12-20 DIAGNOSIS — D84821 Immunodeficiency due to drugs: Secondary | ICD-10-CM | POA: Diagnosis not present

## 2023-12-20 DIAGNOSIS — D84822 Immunodeficiency due to external causes: Secondary | ICD-10-CM | POA: Diagnosis not present

## 2023-12-25 ENCOUNTER — Emergency Department (HOSPITAL_COMMUNITY)

## 2023-12-25 ENCOUNTER — Inpatient Hospital Stay (HOSPITAL_COMMUNITY)
Admission: EM | Admit: 2023-12-25 | Discharge: 2023-12-29 | DRG: 871 | Disposition: A | Discharge status: Home / Self Care | Attending: Internal Medicine | Admitting: Internal Medicine

## 2023-12-25 ENCOUNTER — Encounter (HOSPITAL_COMMUNITY): Payer: Self-pay | Admitting: Internal Medicine

## 2023-12-25 DIAGNOSIS — Z955 Presence of coronary angioplasty implant and graft: Secondary | ICD-10-CM

## 2023-12-25 DIAGNOSIS — D631 Anemia in chronic kidney disease: Secondary | ICD-10-CM | POA: Diagnosis present

## 2023-12-25 DIAGNOSIS — B3731 Acute candidiasis of vulva and vagina: Secondary | ICD-10-CM | POA: Diagnosis present

## 2023-12-25 DIAGNOSIS — Z981 Arthrodesis status: Secondary | ICD-10-CM

## 2023-12-25 DIAGNOSIS — I700 Atherosclerosis of aorta: Secondary | ICD-10-CM | POA: Diagnosis not present

## 2023-12-25 DIAGNOSIS — E1140 Type 2 diabetes mellitus with diabetic neuropathy, unspecified: Secondary | ICD-10-CM | POA: Diagnosis present

## 2023-12-25 DIAGNOSIS — M0600 Rheumatoid arthritis without rheumatoid factor, unspecified site: Secondary | ICD-10-CM

## 2023-12-25 DIAGNOSIS — A419 Sepsis, unspecified organism: Secondary | ICD-10-CM | POA: Diagnosis not present

## 2023-12-25 DIAGNOSIS — D696 Thrombocytopenia, unspecified: Secondary | ICD-10-CM | POA: Diagnosis present

## 2023-12-25 DIAGNOSIS — K219 Gastro-esophageal reflux disease without esophagitis: Secondary | ICD-10-CM | POA: Diagnosis present

## 2023-12-25 DIAGNOSIS — G4733 Obstructive sleep apnea (adult) (pediatric): Secondary | ICD-10-CM | POA: Diagnosis present

## 2023-12-25 DIAGNOSIS — Z801 Family history of malignant neoplasm of trachea, bronchus and lung: Secondary | ICD-10-CM

## 2023-12-25 DIAGNOSIS — Z96611 Presence of right artificial shoulder joint: Secondary | ICD-10-CM | POA: Diagnosis present

## 2023-12-25 DIAGNOSIS — N183 Chronic kidney disease, stage 3 unspecified: Secondary | ICD-10-CM | POA: Diagnosis present

## 2023-12-25 DIAGNOSIS — R509 Fever, unspecified: Secondary | ICD-10-CM | POA: Diagnosis not present

## 2023-12-25 DIAGNOSIS — M797 Fibromyalgia: Secondary | ICD-10-CM | POA: Diagnosis present

## 2023-12-25 DIAGNOSIS — Z66 Do not resuscitate: Secondary | ICD-10-CM | POA: Diagnosis present

## 2023-12-25 DIAGNOSIS — M069 Rheumatoid arthritis, unspecified: Secondary | ICD-10-CM | POA: Diagnosis present

## 2023-12-25 DIAGNOSIS — E0590 Thyrotoxicosis, unspecified without thyrotoxic crisis or storm: Secondary | ICD-10-CM | POA: Diagnosis present

## 2023-12-25 DIAGNOSIS — Z823 Family history of stroke: Secondary | ICD-10-CM

## 2023-12-25 DIAGNOSIS — I2510 Atherosclerotic heart disease of native coronary artery without angina pectoris: Secondary | ICD-10-CM | POA: Diagnosis present

## 2023-12-25 DIAGNOSIS — Z8261 Family history of arthritis: Secondary | ICD-10-CM

## 2023-12-25 DIAGNOSIS — Z8249 Family history of ischemic heart disease and other diseases of the circulatory system: Secondary | ICD-10-CM

## 2023-12-25 DIAGNOSIS — Z853 Personal history of malignant neoplasm of breast: Secondary | ICD-10-CM

## 2023-12-25 DIAGNOSIS — Z833 Family history of diabetes mellitus: Secondary | ICD-10-CM

## 2023-12-25 DIAGNOSIS — E872 Acidosis, unspecified: Secondary | ICD-10-CM | POA: Diagnosis present

## 2023-12-25 DIAGNOSIS — R0902 Hypoxemia: Secondary | ICD-10-CM | POA: Diagnosis present

## 2023-12-25 DIAGNOSIS — J690 Pneumonitis due to inhalation of food and vomit: Secondary | ICD-10-CM | POA: Diagnosis present

## 2023-12-25 DIAGNOSIS — E7800 Pure hypercholesterolemia, unspecified: Secondary | ICD-10-CM | POA: Diagnosis present

## 2023-12-25 DIAGNOSIS — Z7952 Long term (current) use of systemic steroids: Secondary | ICD-10-CM

## 2023-12-25 DIAGNOSIS — Z7984 Long term (current) use of oral hypoglycemic drugs: Secondary | ICD-10-CM

## 2023-12-25 DIAGNOSIS — Z90710 Acquired absence of both cervix and uterus: Secondary | ICD-10-CM

## 2023-12-25 DIAGNOSIS — R0989 Other specified symptoms and signs involving the circulatory and respiratory systems: Secondary | ICD-10-CM | POA: Diagnosis not present

## 2023-12-25 DIAGNOSIS — Z7901 Long term (current) use of anticoagulants: Secondary | ICD-10-CM

## 2023-12-25 DIAGNOSIS — Z1152 Encounter for screening for COVID-19: Secondary | ICD-10-CM

## 2023-12-25 DIAGNOSIS — G4734 Idiopathic sleep related nonobstructive alveolar hypoventilation: Secondary | ICD-10-CM | POA: Diagnosis present

## 2023-12-25 DIAGNOSIS — E1169 Type 2 diabetes mellitus with other specified complication: Secondary | ICD-10-CM

## 2023-12-25 DIAGNOSIS — R918 Other nonspecific abnormal finding of lung field: Secondary | ICD-10-CM | POA: Diagnosis not present

## 2023-12-25 DIAGNOSIS — E1122 Type 2 diabetes mellitus with diabetic chronic kidney disease: Secondary | ICD-10-CM | POA: Diagnosis present

## 2023-12-25 DIAGNOSIS — E039 Hypothyroidism, unspecified: Secondary | ICD-10-CM | POA: Diagnosis present

## 2023-12-25 DIAGNOSIS — K5730 Diverticulosis of large intestine without perforation or abscess without bleeding: Secondary | ICD-10-CM | POA: Diagnosis not present

## 2023-12-25 DIAGNOSIS — I420 Dilated cardiomyopathy: Secondary | ICD-10-CM | POA: Diagnosis present

## 2023-12-25 DIAGNOSIS — I517 Cardiomegaly: Secondary | ICD-10-CM | POA: Diagnosis not present

## 2023-12-25 DIAGNOSIS — J189 Pneumonia, unspecified organism: Secondary | ICD-10-CM | POA: Diagnosis not present

## 2023-12-25 DIAGNOSIS — G9341 Metabolic encephalopathy: Secondary | ICD-10-CM | POA: Diagnosis present

## 2023-12-25 DIAGNOSIS — M810 Age-related osteoporosis without current pathological fracture: Secondary | ICD-10-CM | POA: Diagnosis present

## 2023-12-25 DIAGNOSIS — D801 Nonfamilial hypogammaglobulinemia: Secondary | ICD-10-CM | POA: Diagnosis present

## 2023-12-25 DIAGNOSIS — Z86718 Personal history of other venous thrombosis and embolism: Secondary | ICD-10-CM

## 2023-12-25 DIAGNOSIS — R531 Weakness: Secondary | ICD-10-CM | POA: Diagnosis not present

## 2023-12-25 DIAGNOSIS — Z813 Family history of other psychoactive substance abuse and dependence: Secondary | ICD-10-CM

## 2023-12-25 DIAGNOSIS — Z800 Family history of malignant neoplasm of digestive organs: Secondary | ICD-10-CM

## 2023-12-25 DIAGNOSIS — E119 Type 2 diabetes mellitus without complications: Secondary | ICD-10-CM

## 2023-12-25 DIAGNOSIS — I255 Ischemic cardiomyopathy: Secondary | ICD-10-CM | POA: Diagnosis present

## 2023-12-25 DIAGNOSIS — Z811 Family history of alcohol abuse and dependence: Secondary | ICD-10-CM

## 2023-12-25 DIAGNOSIS — G894 Chronic pain syndrome: Secondary | ICD-10-CM | POA: Diagnosis present

## 2023-12-25 DIAGNOSIS — N1832 Chronic kidney disease, stage 3b: Secondary | ICD-10-CM | POA: Diagnosis present

## 2023-12-25 DIAGNOSIS — Z86711 Personal history of pulmonary embolism: Secondary | ICD-10-CM | POA: Diagnosis present

## 2023-12-25 LAB — CBC WITH DIFFERENTIAL/PLATELET
Abs Immature Granulocytes: 0.05 K/uL (ref 0.00–0.07)
Basophils Absolute: 0 K/uL (ref 0.0–0.1)
Basophils Relative: 1 %
Eosinophils Absolute: 0.3 K/uL (ref 0.0–0.5)
Eosinophils Relative: 4 %
HCT: 32.9 % — ABNORMAL LOW (ref 36.0–46.0)
Hemoglobin: 10.2 g/dL — ABNORMAL LOW (ref 12.0–15.0)
Immature Granulocytes: 1 %
Lymphocytes Relative: 8 %
Lymphs Abs: 0.6 K/uL — ABNORMAL LOW (ref 0.7–4.0)
MCH: 33.7 pg (ref 26.0–34.0)
MCHC: 31 g/dL (ref 30.0–36.0)
MCV: 108.6 fL — ABNORMAL HIGH (ref 80.0–100.0)
Monocytes Absolute: 1.2 K/uL — ABNORMAL HIGH (ref 0.1–1.0)
Monocytes Relative: 15 %
Neutro Abs: 5.9 K/uL (ref 1.7–7.7)
Neutrophils Relative %: 71 %
Platelets: 115 K/uL — ABNORMAL LOW (ref 150–400)
RBC: 3.03 MIL/uL — ABNORMAL LOW (ref 3.87–5.11)
RDW: 20.1 % — ABNORMAL HIGH (ref 11.5–15.5)
WBC: 8.1 K/uL (ref 4.0–10.5)
nRBC: 0 % (ref 0.0–0.2)

## 2023-12-25 LAB — COMPREHENSIVE METABOLIC PANEL WITH GFR
ALT: 40 U/L (ref 0–44)
AST: 44 U/L — ABNORMAL HIGH (ref 15–41)
Albumin: 3.1 g/dL — ABNORMAL LOW (ref 3.5–5.0)
Alkaline Phosphatase: 51 U/L (ref 38–126)
Anion gap: 10 (ref 5–15)
BUN: 24 mg/dL — ABNORMAL HIGH (ref 8–23)
CO2: 22 mmol/L (ref 22–32)
Calcium: 9 mg/dL (ref 8.9–10.3)
Chloride: 106 mmol/L (ref 98–111)
Creatinine, Ser: 1.25 mg/dL — ABNORMAL HIGH (ref 0.44–1.00)
GFR, Estimated: 45 mL/min — ABNORMAL LOW (ref >60)
Glucose, Bld: 117 mg/dL — ABNORMAL HIGH (ref 70–99)
Potassium: 4.2 mmol/L (ref 3.5–5.1)
Sodium: 138 mmol/L (ref 135–145)
Total Bilirubin: 0.7 mg/dL (ref 0.0–1.2)
Total Protein: 6.1 g/dL — ABNORMAL LOW (ref 6.5–8.1)

## 2023-12-25 LAB — RESP PANEL BY RT-PCR (RSV, FLU A&B, COVID)  RVPGX2
Influenza A by PCR: NEGATIVE
Influenza B by PCR: NEGATIVE
Resp Syncytial Virus by PCR: NEGATIVE
SARS Coronavirus 2 by RT PCR: NEGATIVE

## 2023-12-25 LAB — URINALYSIS, W/ REFLEX TO CULTURE (INFECTION SUSPECTED)
Bilirubin Urine: NEGATIVE
Glucose, UA: >=500 mg/dL — AB
Hgb urine dipstick: NEGATIVE
Ketones, ur: NEGATIVE mg/dL
Leukocytes,Ua: NEGATIVE
Nitrite: NEGATIVE
Protein, ur: NEGATIVE mg/dL
Specific Gravity, Urine: 1.011 (ref 1.005–1.030)
pH: 7 (ref 5.0–8.0)

## 2023-12-25 LAB — I-STAT CG4 LACTIC ACID, ED
Lactic Acid, Venous: 2.1 mmol/L (ref 0.5–1.9)
Lactic Acid, Venous: 2.3 mmol/L (ref 0.5–1.9)
Lactic Acid, Venous: 1.8 mmol/L (ref 0.5–1.9)

## 2023-12-25 LAB — PROTIME-INR
INR: 1.1 (ref 0.8–1.2)
Prothrombin Time: 14.5 s (ref 11.4–15.2)

## 2023-12-25 MED ORDER — VANCOMYCIN HCL IN DEXTROSE 1-5 GM/200ML-% IV SOLN
1000.0000 mg | Freq: Once | INTRAVENOUS | Status: AC
  Administered 2023-12-25: 1000 mg via INTRAVENOUS
  Filled 2023-12-25: qty 200

## 2023-12-25 MED ORDER — LACTATED RINGERS IV BOLUS (SEPSIS)
1000.0000 mL | Freq: Once | INTRAVENOUS | Status: AC
Start: 2023-12-25 — End: 2023-12-25
  Administered 2023-12-25: 1000 mL via INTRAVENOUS

## 2023-12-25 MED ORDER — SODIUM CHLORIDE 0.9 % IV SOLN
2.0000 g | Freq: Once | INTRAVENOUS | Status: AC
  Administered 2023-12-25: 2 g via INTRAVENOUS
  Filled 2023-12-25: qty 12.5

## 2023-12-25 MED ORDER — LACTATED RINGERS IV BOLUS (SEPSIS)
250.0000 mL | Freq: Once | INTRAVENOUS | Status: AC
  Administered 2023-12-25: 250 mL via INTRAVENOUS

## 2023-12-25 MED ORDER — IOHEXOL 300 MG/ML  SOLN
80.0000 mL | Freq: Once | INTRAMUSCULAR | Status: AC | PRN
Start: 2023-12-25 — End: 2023-12-25
  Administered 2023-12-25: 80 mL via INTRAVENOUS

## 2023-12-25 MED ORDER — LACTATED RINGERS IV SOLN: INTRAVENOUS | Status: DC

## 2023-12-25 MED ORDER — METRONIDAZOLE 500 MG/100ML IV SOLN
500.0000 mg | Freq: Once | INTRAVENOUS | Status: AC
  Administered 2023-12-25: 500 mg via INTRAVENOUS
  Filled 2023-12-25: qty 100

## 2023-12-25 MED ORDER — LACTATED RINGERS IV BOLUS (SEPSIS)
500.0000 mL | Freq: Once | INTRAVENOUS | Status: AC
Start: 2023-12-25 — End: 2023-12-25
  Administered 2023-12-25: 500 mL via INTRAVENOUS

## 2023-12-25 MED ORDER — ASPIRIN 300 MG RE SUPP: 300.0000 mg | Freq: Once | RECTAL | Status: DC

## 2023-12-25 NOTE — H&P (Signed)
 History and Physical    Ebony Scott FMW:[id number] DOB: 1950-03-25 DOA: 12/25/2023  Patient coming from: Home.  Chief Complaint: Lethargic.  HPI: Ebony Scott is a 73 y.o. female with history of CAD status post PCI, ischemic cardiomyopathy, diabetes mellitus type 2, sleep apnea, prior history of DVT, hypogammaglobinemia, rheumatoid arthritis who was admitted in early part of this year for Enterococcus faecalis bacteremia and ICD was removed at the time, was brought to the ER after family noticed that patient was lethargic.  Per patient's daughter who usually checks on the patient remotely was found to be not very active and asked her brother to check on her and found that patient was lethargic and EMS was called and brought to the ER.  Patient received COVID-vaccine yesterday.  And as per the daughter, who is a engineer, civil (consulting), patient usually gets lethargic when she gets fever.  Patient also may have vomited once.  On the way to the ER patient became more alert awake.   ED Course: In ER patient was initially hypotensive with a blood pressure in the 90s.  Lactic acid  was elevated at 2.3 WBC 8.1 CT chest abdomen/pelvis shows features concerning for pneumonia.  COVID and flu test were negative started on empiric antibiotics admitted for further management of pneumonia with possible sepsis.  Review of Systems: As per HPI, rest all negative.   Past Medical History:  Diagnosis Date   Allergy     Anemia    Angio-edema    Breast cancer (HCC) 1998   Left breast, in remission   Bronchiectasis (HCC)    Cataract    CHF (congestive heart failure) (HCC)    Closed displaced fracture of metatarsal bone of right foot 01/20/2019   Closed fracture of fifth metatarsal bone of right foot, initial encounter 01/15/2018   Clostridium difficile colitis 01/14/2018   Coronary artery disease    Depression    some; don't take anything for it (02/20/2012), pt denies dx as of 06/02/21   Diverticulosis    DVT (deep venous  thrombosis) (HCC)    Fibromyalgia 11/2011   Fracture of base of fifth metatarsal bone with routine healing, left    GAVE (gastric antral vascular ectasia)    GERD (gastroesophageal reflux disease)    Headache(784.0)    related to allergies; more at different times during the year (02/20/2012)   Hemorrhoids    Hiatal hernia    back and neck   History of blood transfusion 2023   1 unit, 5 units of Iron    Hx of adenomatous colonic polyps 04/12/2016   Hypercholesteremia    good cholesterol is high   Hypothyroidism    h/o Graves disease   IBS (irritable bowel syndrome)    Moderate persistent asthma    -FeV1 72% 2011, -IgE 102 2011, CT sinus Neg 2011   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 Cindy Setter   Sleep apnea    uses cpap nightly   Tracheobronchomalacia    Vasculitis 12/13/2017    Past Surgical History:  Procedure Laterality Date   ABDOMINAL HYSTERECTOMY N/A    Phreesia 10/21/2019   ANTERIOR AND POSTERIOR REPAIR  1990's   APPENDECTOMY     ARGON LASER APPLICATION  02/05/2021  Procedure: ARGON LASER APPLICATION;  Surgeon: Leigh Elspeth SQUIBB, MD;  Location: [facility];  Service: Gastroenterology;;   BREAST LUMPECTOMY  1998   left   BREAST SURGERY N/A    Phreesia 10/21/2019   BRONCHIAL WASHINGS  04/05/2020   Procedure: BRONCHIAL WASHINGS;  Surgeon: Kara Dorn NOVAK, MD;  Location: [facility];  Service: Pulmonary;;   CAPSULOTOMY Right 08/28/2021   Procedure: CAPSULOTOMY;  Surgeon: Tobie Franky SQUIBB, DPM;  Location: [facility];  Service: Podiatry;  Laterality: Right;   CARPOMETACARPEL (CMC) FUSION OF THUMB WITH AUTOGRAFT FROM RADIUS  ~ 2009   both thumbs (02/20/2012)   CATARACT EXTRACTION W/ INTRAOCULAR LENS  IMPLANT, BILATERAL  2012    CERVICAL DISCECTOMY  10/2001   C5-C6   CERVICAL FUSION  2003   C3-C4   CHOLECYSTECTOMY     COLONOSCOPY     CORONARY STENT INTERVENTION N/A 01/11/2021   Procedure: CORONARY STENT INTERVENTION;  Surgeon: Anner Alm ORN, MD;  Location: [facility] INVASIVE CV LAB;  Service: Cardiovascular;  Laterality: N/A;   DEBRIDEMENT TENNIS ELBOW  ?1970's   right   ESOPHAGOGASTRODUODENOSCOPY     ESOPHAGOGASTRODUODENOSCOPY (EGD) WITH PROPOFOL  N/A 02/05/2021   Procedure: ESOPHAGOGASTRODUODENOSCOPY (EGD) WITH PROPOFOL ;  Surgeon: Leigh Elspeth SQUIBB, MD;  Location: [facility];  Service: Gastroenterology;  Laterality: N/A;   EYE SURGERY N/A    Phreesia 10/21/2019   HAMMER TOE SURGERY Right 08/28/2021   Procedure: HAMMER TOE REPAIR 2ND TOE RIGHT FOOT;  Surgeon: Tobie Franky SQUIBB, DPM;  Location: [facility];  Service: Podiatry;  Laterality: Right;   HYSTERECTOMY     I & D EXTREMITY Right 09/22/2020   Procedure: IRRIGATION AND DEBRIDEMENT RIGHT HAND AND ELBOW;  Surgeon: Josefina Chew, MD;  Location: [facility];  Service: Orthopedics;  Laterality: Right;   ICD GENERATOR REMOVAL N/A 08/21/2023   Procedure: ICD GENERATOR REMOVAL;  Surgeon: Waddell Danelle ORN, MD;  Location: [facility] INVASIVE CV LAB;  Service: Cardiovascular;  Laterality: N/A;   ICD IMPLANT N/A 08/27/2022   Procedure: ICD IMPLANT;  Surgeon: Nancey Eulas BRAVO, MD;  Location: [facility];  Service: Cardiovascular;  Laterality: N/A;   JOINT REPLACEMENT  02/15/2021   KNEE ARTHROPLASTY  ?8009'd   ?right; w/cartilage repair (02/20/2012)   LEAD EXTRACTION N/A 08/21/2023   Procedure: LEAD EXTRACTION;  Surgeon: Waddell Danelle ORN, MD;  Location: [facility];  Service: Cardiovascular;  Laterality: N/A;   MASS EXCISION Left 08/28/2021   Procedure: EXCISION OF SOFT TISSUE  MASS LEFT FOOT;  Surgeon: Tobie Franky SQUIBB, DPM;  Location: [facility];  Service: Podiatry;  Laterality: Left;   NASAL SEPTUM SURGERY  1980's   POSTERIOR CERVICAL FUSION/FORAMINOTOMY  2004   failed  initial fusion; rewired  anterior neck (02/20/2012)   REVERSE SHOULDER ARTHROPLASTY Right 02/16/2020   Procedure: REVERSE SHOULDER ARTHROPLASTY;  Surgeon: Josefina Chew, MD;  Location: [facility];  Service: Orthopedics;  Laterality: Right;   RIGHT/LEFT HEART CATH AND CORONARY ANGIOGRAPHY N/A 01/11/2021   Procedure: RIGHT/LEFT HEART CATH AND CORONARY ANGIOGRAPHY;  Surgeon: Anner Alm ORN, MD;  Location: [facility] INVASIVE CV LAB;  Service: Cardiovascular;  Laterality: N/A;   SPINE SURGERY N/A    Phreesia 10/21/2019   TONSILLECTOMY  ~ 1953   TRANSESOPHAGEAL ECHOCARDIOGRAM (CATH LAB) N/A 08/12/2023   Procedure: TRANSESOPHAGEAL ECHOCARDIOGRAM;  Surgeon: Pietro Redell RAMAN, MD;  Location: [facility] INVASIVE CV LAB;  Service: Cardiovascular;  Laterality: N/A;   VESICOVAGINAL FISTULA CLOSURE W/ TAH  1988   VIDEO BRONCHOSCOPY Bilateral 08/23/2016   Procedure: VIDEO BRONCHOSCOPY  WITH FLUORO;  Surgeon: Noreen Tonnie BRAVO, MD;  Location: [facility];  Service: Cardiopulmonary;  Laterality: Bilateral;   VIDEO BRONCHOSCOPY N/A 04/05/2020   Procedure: VIDEO BRONCHOSCOPY WITHOUT FLUORO;  Surgeon: Kara Dorn NOVAK, MD;  Location: [facility];  Service: Pulmonary;  Laterality: N/A;     reports that she has never smoked. She has been exposed to tobacco smoke. She has never used smokeless tobacco. She reports that she does not currently use alcohol . She reports that she does not use drugs.  Allergies  Allergen Reactions   Baclofen  Anaphylaxis and Other (See Comments)    Altered mental status requiring a 3-day hospital stay- patient became unresponsive   Put me in a coma for five days, Altered mental status requiring a 3-day hospital stay- patient became unresponsive   Other Shortness Of Breath and Other (See Comments)    Grass and weeds sneezing; filled sinuses (02/20/2012)   Porcine (Pork) Protein-Containing Drug Products Anaphylaxis   Shellfish Allergy  Anaphylaxis, Shortness Of Breath, Itching and Rash   Shrimp  Extract Anaphylaxis and Other (See Comments)    ALL SHELLFISH   Tetracycline Hcl Nausea And Vomiting   Xolair  [Omalizumab ] Other (See Comments)    Caused Blood clot   Zoledronic  Acid Other (See Comments)    Reclast - Fever, Put in hospital, dr said it was a reaction from a reaction    Fever, inability to walk, Put in hospital  Fever, Put in hospital, dr said it was a reaction from a reaction , Other reaction(s): Unknown   Celecoxib Swelling and Other (See Comments)    Feet swelling   Dilaudid  [Hydromorphone  Hcl] Itching   Hydrocodone -Acetaminophen  Itching   Levofloxacin  Other (See Comments)    GI upset   Molds & Smuts Cough   Morphine  And Codeine Hives and Itching   Oxycodone  Hcl Nausea And Vomiting and Other (See Comments)    GI Intolerance   Paroxetine Nausea And Vomiting and Other (See Comments)    GI Intolerance also   Tetracycline Hcl Other (See Comments)    GI Intolerance   Oxycodone -Acetaminophen  Itching   Diltiazem  Hcl Swelling   Dust Mite Extract Other (See Comments) and Cough    sneezing (02/20/2012) too   Gamunex [Immune Globulin ] Itching, Rash and Other (See Comments)    Tolerates hizentra     Rituximab  Other (See Comments)    Anemia    Rofecoxib Swelling    Vioxx- feet swelling   Tree Extract Itching    tested and told I was allergic to it; never experienced a reaction to it (02/20/2012)    Family History  Problem Relation Age of Onset   Allergies Mother    Heart disease Mother    Arthritis Mother    Lung cancer Mother        heavy smoker   Diabetes Mother    Cancer Mother    Miscarriages / Stillbirths Mother    Obesity Mother    Allergies Father    Heart disease Father    Arthritis Father    Stroke Father    Alcohol  abuse Father    Hypertension Father    Heart disease Brother    Diabetes Maternal Grandfather    Colitis Daughter    Colon cancer Other        Maternal half uncle   Heart disease Brother    Drug abuse Brother    Early death  Brother    Drug abuse Brother     Prior to Admission medications  Medication Sig Start Date End Date Taking? Authorizing Provider  acetaminophen  (TYLENOL ) 500 MG tablet Take 1 tablet (500 mg total) by mouth every 6 (six) hours as needed for moderate pain (pain score 4-6) or mild pain (pain score 1-3). Patient taking differently: Take 1,000 mg by mouth daily as needed for mild pain (pain score 1-3) (or headaches). 08/22/23  Yes Amin, Ankit C, MD  albuterol  (PROVENTIL ) (2.5 MG/3ML) 0.083% nebulizer solution Take 3 mLs (2.5 mg total) by nebulization every 4 (four) hours as needed for wheezing or shortness of breath. 01/01/23  Yes Kozlow, Camellia PARAS, MD  albuterol  (VENTOLIN  HFA) 108 (90 Base) MCG/ACT inhaler Inhale 2 puffs into the lungs every 6 (six) hours as needed for wheezing or shortness of breath. 01/01/23  Yes Kozlow, Camellia PARAS, MD  allopurinol  (ZYLOPRIM ) 100 MG tablet Take 1 tablet (100 mg total) by mouth daily. Patient taking differently: Take 100 mg by mouth daily with supper. 03/04/23  Yes [name], MD  Alpha-D-Galactosidase (BEANO PO) Take 2 tablets by mouth 3 (three) times daily before meals.   Yes [provider]  Alpha-Lipoic Acid 600 MG TABS Take 600 mg by mouth in the morning.   Yes [provider]  atorvastatin  (LIPITOR) 80 MG tablet Take 1 tablet (80 mg total) by mouth every evening. 10/03/23  Yes [name], MD  Azelastine  HCl 137 MCG/SPRAY SOLN 2 sprays once per day Patient taking differently: Place 1 spray into both nostrils 2 (two) times daily as needed (for allergies or rhinitis). 08/02/23  Yes Aletha Bene, MD  benzonatate  (TESSALON ) 100 MG capsule Take 100 mg by mouth 3 (three) times daily as needed for cough.   Yes [provider]  Calcium  Citrate (CITRACAL PO) Take 1 tablet by mouth See admin instructions. Citracal + Magnesium  600 mg  - Take 1 tablet by mouth at bedtime   Yes [provider]  cetirizine  (ZYRTEC ) 10 MG tablet TAKE  1 TABLET (10 MG TOTAL) BY MOUTH DAILY AS NEEDED FOR ALLERGIES (CAN TAKE A N EXTRA DOSE DURING FLARE UPS.). Patient taking differently: Take 10 mg by mouth at bedtime. 10/16/23  Yes Kozlow, Camellia PARAS, MD  Cholecalciferol  (VITAMIN D3) 25 MCG (1000 UT) capsule Take 1,000 Units by mouth at bedtime.   Yes [provider]  dexlansoprazole  (DEXILANT ) 60 MG capsule TAKE 1 CAPSULE (60 MG TOTAL) BY MOUTH IN THE MORNING 12/20/23  Yes Kozlow, Camellia PARAS, MD  diclofenac  Sodium (VOLTAREN ) 1 % GEL Apply 2 g topically 4 (four) times daily as needed (for pain).   Yes [provider]  empagliflozin  (JARDIANCE ) 10 MG TABS tablet Take 1 tablet (10 mg total) by mouth daily. 07/05/23  Yes [name], MD  ENTRESTO  24-26 MG TAKE 1 TABLET BY MOUTH TWICE A DAY 05/07/23  Yes Lonni Slain, MD  EPINEPHRINE  0.3 mg/0.3 mL IJ SOAJ injection USE AS DIRECTED BY YOUR PHYSICIAN INTRAMUSCULARLY AS NEEDED FOR ANAPHYLAXIS 10/09/22  Yes Kozlow, Camellia PARAS, MD  famotidine  (PEPCID ) 20 MG tablet TAKE 1 TABLET (20 MG) BY MOUTH DAILY BEFORE LUNCH AND1 TABLET BY MOUTH EACH NIGHT AT BEDTIME Patient taking differently: Take 20 mg by mouth at bedtime. 05/07/23  Yes Kozlow, Camellia PARAS, MD  fluticasone  (FLONASE ) 50 MCG/ACT nasal spray Place 2 sprays into both nostrils daily. Patient taking differently: Place 2 sprays into both nostrils daily as needed for allergies or rhinitis. 08/02/23  Yes Aletha Bene, MD  Fluticasone -Umeclidin-Vilant (TRELEGY ELLIPTA ) 200-62.5-25 MCG/ACT AEPB Inhale 1 Inhalation into the  lungs in the morning. Patient taking differently: Inhale 2 puffs into the lungs at bedtime. 09/18/23  Yes Kara Dorn NOVAK, MD  folic acid  (FOLVITE ) 1 MG tablet Take 1 tablet (1 mg total) by mouth daily. 03/04/23  Yes [name], MD  furosemide  (LASIX ) 40 MG tablet Take 0.5 tablets (20 mg total) by mouth daily as needed for fluid or edema (Weight gain, edema >3lbs). Monday and Thursday Patient taking differently: Take 20  mg by mouth daily as needed for fluid or edema (Weight gain, edema >3 pounds as directed). 08/22/23  Yes Amin, Ankit C, MD  gabapentin  (NEURONTIN ) 300 MG capsule Take 1 capsule (300 mg total) by mouth at bedtime. 05/04/22  Yes Patsy Lenis, MD  guaiFENesin  (MUCINEX ) 600 MG 12 hr tablet Take 1,200 mg by mouth in the morning.   Yes [provider]  Immune Globulin , Human,-slra (ASCENIV) 5 GM/50ML SOLN Inject 50 g into the vein every 28 (twenty-eight) days. 12/04/23  Yes Kozlow, Camellia PARAS, MD  ipratropium-albuterol  (DUONEB) 0.5-2.5 (3) MG/3ML SOLN Take 3 mLs by nebulization every 6 (six) hours as needed. Patient taking differently: Take 3 mLs by nebulization every 6 (six) hours as needed (for shortness of breath or wheezing). 07/22/23  Yes Kara Dorn NOVAK, MD  Lactase (LACTOSE FAST ACTING RELIEF PO) Take 2 tablets by mouth 3 (three) times daily before meals.   Yes [provider]  magnesium  oxide (MAG-OX) 400 (240 Mg) MG tablet Take 400 mg by mouth at bedtime.   Yes [provider]  montelukast  (SINGULAIR ) 10 MG tablet Take 1 tablet (10 mg total) by mouth daily after supper. 03/04/23  Yes [name], MD  mupirocin  ointment (BACTROBAN ) 2 % Apply 1 Application topically as needed (wounds). 01/29/23  Yes Kozlow, Camellia PARAS, MD  nystatin  (MYCOSTATIN ) 100000 UNIT/ML suspension Swish, gargle, and swallow 5 ml after Trelegy use Patient taking differently: Use as directed 5 mLs in the mouth or throat See admin instructions. Swish, gargle, and swallow 5 ml's after Trelegy use 05/24/23  Yes Perri DELENA Meliton Mickey., MD  OXYGEN  Inhale 2 L/min into the lungs at bedtime.   Yes [provider]  potassium chloride  (KLOR-CON ) 10 MEQ tablet Take 10 mEq by mouth See admin instructions. Take 10 mEq by mouth once a day only when taking Lasix    Yes [provider]  predniSONE  (DELTASONE ) 5 MG tablet Take 1 tablet (5 mg total) by mouth daily with breakfast. 05/13/23  Yes Rice, Lonni ORN, MD  Prenatal Vit-Fe Fumarate-FA (PRENATAL MULTIVITAMIN) TABS tablet Take 1 tablet by mouth daily at 12 noon.   Yes [provider]  PRESCRIPTION MEDICATION CPAP- At bedtime   Yes [provider]  Probiotic Product (ALIGN) 4 MG CAPS Take 4 mg by mouth every evening.   Yes [provider]  rivaroxaban  (XARELTO ) 20 MG TABS tablet Take 1 tablet (20 mg total) by mouth daily with supper. 08/26/23  Yes Amin, Ankit C, MD  traZODone  (DESYREL ) 50 MG tablet Take 50 mg by mouth at bedtime as needed for sleep. 09/03/23  Yes [provider]  HIZENTRA  10 GM/50ML SOLN Inject 15 g into the skin once a week. 13 g Infusion Patient not taking: Reported on 12/25/2023 01/14/23   Maurilio Camellia PARAS, MD  Respiratory Therapy Supplies (NEBULIZER MASK ADULT) MISC 1 Device by Does not apply route as directed. 01/01/23   Kozlow, Camellia PARAS, MD    Physical Exam: Constitutional: Moderately built and nourished. Vitals:   12/25/23  1930 12/25/23 2100 12/25/23 2130 12/25/23 2326  BP: (!) 132/58  (!) 129/53 (!) 151/71  Pulse: 78  77 78  Resp: 16  18 18   Temp:  98.9 F (37.2 C)  98.7 F (37.1 C)  TempSrc:  Oral    SpO2: 99%  100% 100%   Eyes: Anicteric no pallor. ENMT: No discharge from the ears eyes nose or mouth. Neck: No mass felt.  No neck rigidity. Respiratory: No rhonchi or crepitations. Cardiovascular: S1-S2 heard. Abdomen: Soft nontender bowel sound present. Musculoskeletal: No edema. Skin: No rash. Neurologic: Alert awake oriented to time place and person.  Moves all extremities. Psychiatric: Appears normal.  Normal affect.   Labs on Admission: I have personally reviewed following labs and imaging studies  CBC: Recent Labs  Lab 12/25/23 1901  WBC 8.1  NEUTROABS 5.9  HGB 10.2*  HCT 32.9*  MCV 108.6*  PLT 115*   Basic Metabolic Panel: Recent Labs  Lab 12/25/23 1901  NA 138  K 4.2  CL 106  CO2 22  GLUCOSE 117*  BUN 24*  CREATININE 1.25*  CALCIUM  9.0    GFR: CrCl cannot be calculated (Unknown ideal weight.). Liver Function Tests: Recent Labs  Lab 12/25/23 1901  AST 44*  ALT 40  ALKPHOS 51  BILITOT 0.7  PROT 6.1*  ALBUMIN 3.1*   No results for input(s): LIPASE, AMYLASE in the last 168 hours. No results for input(s): AMMONIA in the last 168 hours. Coagulation Profile: Recent Labs  Lab 12/25/23 1901  INR 1.1   Cardiac Enzymes: No results for input(s): CKTOTAL, CKMB, CKMBINDEX, TROPONINI in the last 168 hours. BNP (last 3 results) No results for input(s): PROBNP in the last 8760 hours. HbA1C: No results for input(s): HGBA1C in the last 72 hours. CBG: No results for input(s): GLUCAP in the last 168 hours. Lipid Profile: No results for input(s): CHOL, HDL, LDLCALC, TRIG, CHOLHDL, LDLDIRECT in the last 72 hours. Thyroid  Function Tests: No results for input(s): TSH, T4TOTAL, FREET4, T3FREE, THYROIDAB in the last 72 hours. Anemia Panel: No results for input(s): VITAMINB12, FOLATE, FERRITIN, TIBC, IRON , RETICCTPCT in the last 72 hours. Urine analysis:    Component Value Date/Time   COLORURINE YELLOW 12/25/2023 1942   APPEARANCEUR CLEAR 12/25/2023 1942   LABSPEC 1.011 12/25/2023 1942   PHURINE 7.0 12/25/2023 1942   GLUCOSEU >=500 (A) 12/25/2023 1942   HGBUR NEGATIVE 12/25/2023 1942   BILIRUBINUR NEGATIVE 12/25/2023 1942   KETONESUR NEGATIVE 12/25/2023 1942   PROTEINUR NEGATIVE 12/25/2023 1942   UROBILINOGEN 0.2 05/17/2014 1113   NITRITE NEGATIVE 12/25/2023 1942   LEUKOCYTESUR NEGATIVE 12/25/2023 1942   Sepsis Labs: @LABRCNTIP (procalcitonin:4,lacticidven:4) ) Recent Results (from the past 240 hours)  Resp panel by RT-PCR (RSV, Flu A&B, Covid)     Status: None   Collection Time: 12/25/23  9:48 PM   Specimen: Nasal Swab  Result Value Ref Range Status   SARS Coronavirus 2 by RT PCR NEGATIVE NEGATIVE Final    Comment: (NOTE) SARS-CoV-2 target nucleic acids are NOT  DETECTED.  The SARS-CoV-2 RNA is generally detectable in upper respiratory specimens during the acute phase of infection. The lowest concentration of SARS-CoV-2 viral copies this assay can detect is 138 copies/mL. A negative result does not preclude SARS-Cov-2 infection and should not be used as the sole basis for treatment or other patient management decisions. A negative result may occur with  improper specimen collection/handling, submission of specimen other than nasopharyngeal swab, presence of viral mutation(s) within the areas targeted by this  assay, and inadequate number of viral copies(<138 copies/mL). A negative result must be combined with clinical observations, patient history, and epidemiological information. The expected result is Negative.  Fact Sheet for Patients:  bloggercourse.com  Fact Sheet for Healthcare Providers:  seriousbroker.it  This test is no t yet approved or cleared by the [place]  FDA and  has been authorized for detection and/or diagnosis of SARS-CoV-2 by FDA under an Emergency Use Authorization (EUA). This EUA will remain  in effect (meaning this test can be used) for the duration of the COVID-19 declaration under Section 564(b)(1) of the Act, 21 U.S.C.section 360bbb-3(b)(1), unless the authorization is terminated  or revoked sooner.       Influenza A by PCR NEGATIVE NEGATIVE Final   Influenza B by PCR NEGATIVE NEGATIVE Final    Comment: (NOTE) The Xpert Xpress SARS-CoV-2/FLU/RSV plus assay is intended as an aid in the diagnosis of influenza from Nasopharyngeal swab specimens and should not be used as a sole basis for treatment. Nasal washings and aspirates are unacceptable for Xpert Xpress SARS-CoV-2/FLU/RSV testing.  Fact Sheet for Patients: bloggercourse.com  Fact Sheet for Healthcare Providers: seriousbroker.it  This test is not yet  approved or cleared by the [place]  FDA and has been authorized for detection and/or diagnosis of SARS-CoV-2 by FDA under an Emergency Use Authorization (EUA). This EUA will remain in effect (meaning this test can be used) for the duration of the COVID-19 declaration under Section 564(b)(1) of the Act, 21 U.S.C. section 360bbb-3(b)(1), unless the authorization is terminated or revoked.     Resp Syncytial Virus by PCR NEGATIVE NEGATIVE Final    Comment: (NOTE) Fact Sheet for Patients: bloggercourse.com  Fact Sheet for Healthcare Providers: seriousbroker.it  This test is not yet approved or cleared by the [place]  FDA and has been authorized for detection and/or diagnosis of SARS-CoV-2 by FDA under an Emergency Use Authorization (EUA). This EUA will remain in effect (meaning this test can be used) for the duration of the COVID-19 declaration under Section 564(b)(1) of the Act, 21 U.S.C. section 360bbb-3(b)(1), unless the authorization is terminated or revoked.  Performed at [facility], [address], [place], [place] [address]      Radiological Exams on Admission: CT CHEST ABDOMEN PELVIS W CONTRAST Result Date: 12/25/2023 CLINICAL DATA:  Sepsis lethargy and vomiting EXAM: CT CHEST, ABDOMEN, AND PELVIS WITH CONTRAST TECHNIQUE: Multidetector CT imaging of the chest, abdomen and pelvis was performed following the standard protocol during bolus administration of intravenous contrast. RADIATION DOSE REDUCTION: This exam was performed according to the departmental dose-optimization program which includes automated exposure control, adjustment of the mA and/or kV according to patient size and/or use of iterative reconstruction technique. CONTRAST:  80mL OMNIPAQUE  IOHEXOL  300 MG/ML  SOLN COMPARISON:  Chest x-ray 12/25/2023, CT 07/31/2023, 03/29/2023 FINDINGS: CT CHEST FINDINGS Cardiovascular: Moderate aortic  atherosclerosis. No aneurysm. Multi vessel coronary vascular calcification. Normal cardiac size. No pericardial effusion Mediastinum/Nodes: Patent trachea. No thyroid  mass. No suspicious lymph nodes. Esophagus within normal limits. Lungs/Pleura: No pleural effusion or pneumothorax. Bandlike scarring in the lower lobes. Mild consolidation and ground-glass disease in the posterior right upper lobe. Musculoskeletal: No acute osseous abnormality. CT ABDOMEN PELVIS FINDINGS Hepatobiliary: Hepatic steatosis. Cholecystectomy. No biliary dilatation Pancreas: Unremarkable. No pancreatic ductal dilatation or surrounding inflammatory changes. Spleen: Normal in size without focal abnormality. Adrenals/Urinary Tract: Normal adrenal glands. Slightly enlarged extrarenal pelvises without hydroureter. No obstructing stone. Moderate urinary bladder distension Stomach/Bowel: The stomach is nonenlarged. No dilated small  bowel. No acute bowel wall thickening. Diverticular disease of the left colon. Vascular/Lymphatic: Moderate aortic atherosclerosis. No aneurysm. No suspicious lymph nodes. Reproductive: Hysterectomy.  No adnexal mass Other: Negative for ascites or free air Musculoskeletal: Postsurgical changes of the left breast. Chronic soft tissue calcifications overlying left hip. No acute osseous abnormality. IMPRESSION: 1. Mild consolidation and ground-glass disease in the posterior right upper lobe, possible pneumonia or aspiration. 2. No CT evidence for acute intra-abdominal or pelvic abnormality. 3. Hepatic steatosis. 4. Diverticular disease of the left colon without acute inflammatory process. 5. Aortic atherosclerosis. Aortic Atherosclerosis (ICD10-I70.0). Electronically Signed   By: Luke Bun M.D.   On: 12/25/2023 21:07   DG Chest Port 1 View Result Date: 12/25/2023 EXAM: 1 VIEW(S) XRAY OF THE CHEST 12/25/2023 06:45:00 PM COMPARISON: 08/22/2023 CLINICAL HISTORY: Questionable sepsis - evaluate for abnormality FINDINGS:  LINES, TUBES AND DEVICES: Right PICC removed. LUNGS AND PLEURA: Low lung volume. No focal pulmonary opacity. No pulmonary edema. No pleural effusion. No pneumothorax. HEART AND MEDIASTINUM: Stable mild cardiomegaly. BONES AND SOFT TISSUES: Left chest wall surgical clips. Right shoulder arthroplasty noted. IMPRESSION: 1. No acute cardiopulmonary process. 2. Stable mild cardiomegaly. Electronically signed by: Luke Bun MD 12/25/2023 07:07 PM EST RP Workstation: [id number]      Assessment/Plan Principal Problem:   CAP (community acquired pneumonia) Active Problems:   Hypogammaglobulinemia   Chronic kidney disease, stage 3b (HCC) - baseline Scr 1.3-1.5   Rheumatoid arthritis (HCC)   History of pulmonary embolus (PE)   DMII (diabetes mellitus, type 2) (HCC)   DNR (do not resuscitate)   Nocturnal hypoxia - 2-3 L/min   Hyperthyroidism   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HCC)     Sepsis from pneumonia -     patient was hypotensive febrile with temperature of 102 F elevated lactic acid  with confusion concerning for sepsis likely from pneumonia possibly aspiration.  Blood pressure and lactic acid  improved with fluids.  Will continue with empiric antibiotics follow cultures. Ischemic cardiomyopathy status post extraction of ICD in July 2025 after patient had Enterococcus faecalis bacteremia.  Last EF measured was in June 2025 showed EF of 25 to 30%.  On Entresto  and Jardiance . CAD status post PCI denies any chest pain.  Will continue on statins aspirin  and Xarelto . Rheumatoid arthritis on chronic prednisone . Chronic kidney disease stage III creatinine at around baseline. Anemia likely from renal disease follow CBC. GERD on PPI and Pepcid . Diabetes mellitus type 2 presently not on any medication.  On sliding scale coverage for now.  Last hemoglobin A1c was 6.5 about 4 months ago. GERD on PPI. Hypogammaglobinemia gets IVIG infusions. History of PE on Xarelto . Chronic pain on gabapentin .   Confirmed on PDMP website.  Medications confirmed with patient's daughter.  Since patient has possible sepsis with pneumonia will need close monitoring further workup and more than 2 midnight stay.   DVT prophylaxis: Xarelto . Code Status: DNR confirmed with patient's daughter. Family Communication: Patient's daughter. Disposition Plan: Medical floor. Consults called: None. Admission status: Observation.

## 2023-12-25 NOTE — ED Notes (Signed)
 I stat lactic Acid  was given to RN.  On coming tech will give to MD.

## 2023-12-25 NOTE — ED Notes (Signed)
 I-stat Lactic Acid  given to MD and Paramedic.   2.07

## 2023-12-25 NOTE — ED Triage Notes (Signed)
 Patient BIB EMS per EMS. Family called to have patient taken to ED to be evaluated. Patient got flu and COVID vaccines yesterday. Patient lethargic. Vomited this morning per EMS. GCS 13 en route and on arrival to ED GCS 15.    Per EMS:  T 102.4 650mg  Tylenol  given 75ml LR Vomiting - 4mg  zofran   20g L AC

## 2023-12-25 NOTE — Sepsis Progress Note (Signed)
 eLink is following this Code Sepsis.

## 2023-12-25 NOTE — ED Provider Notes (Signed)
 [place] EMERGENCY DEPARTMENT AT [facility] Provider Note   CSN: [id number] Arrival date & time: 12/25/23  1641     Patient presents with: No chief complaint on file.   Ebony Scott is a 73 y.o. female.   Pt is a 73 yo female with pmhx sig for asthma, gerd, hypothyroidism, fibromyalgia, breast cancer, DVT/PE (on Xarelto ), RA, sleep apnea, CAD, and IBS.  Pt comes from home with AMS.  Pt is a poor historian.  Pt did get her flu and covid vaccine yesterday and has not felt well since then.  Pt has been confused and has not wanted to get oob.  She did vomit this am.  She was febrile per EMS and was given 650 mg tylenol , 4 mg zofran  iv, and 75 cc LR.  Pt's nausea is improved.  Pt denies any pain.       Prior to Admission medications   Medication Sig Start Date End Date Taking? Authorizing Provider  acetaminophen  (TYLENOL ) 500 MG tablet Take 1 tablet (500 mg total) by mouth every 6 (six) hours as needed for moderate pain (pain score 4-6) or mild pain (pain score 1-3). 08/22/23   Amin, Ankit C, MD  albuterol  (PROVENTIL ) (2.5 MG/3ML) 0.083% nebulizer solution Take 3 mLs (2.5 mg total) by nebulization every 4 (four) hours as needed for wheezing or shortness of breath. Patient not taking: Reported on 12/05/2023 01/01/23   Maurilio Camellia PARAS, MD  albuterol  (VENTOLIN  HFA) 108 [phone] Base) MCG/ACT inhaler Inhale 2 puffs into the lungs every 6 (six) hours as needed for wheezing or shortness of breath. Patient not taking: Reported on 12/05/2023 01/01/23   Kozlow, Eric J, MD  allopurinol  (ZYLOPRIM ) 100 MG tablet Take 1 tablet (100 mg total) by mouth daily. 03/04/23   [name], MD  Alpha-D-Galactosidase (BEANO PO) Take 2 tablets by mouth in the morning, at noon, and at bedtime.    [provider]  Alpha-Lipoic Acid 600 MG TABS Take 600 mg by mouth in the morning.    [provider]  atorvastatin  (LIPITOR) 80 MG tablet Take 1 tablet (80 mg total) by mouth every evening.  10/03/23   [name], MD  Azelastine  HCl 137 MCG/SPRAY SOLN 2 sprays once per day 08/02/23   Aletha Bene, MD  Calcium -Magnesium -Vitamin D  (CITRACAL CALCIUM +D) 600-40-500 MG-MG-UNIT TB24 Take 1 tablet by mouth daily.    [provider]  cetirizine  (ZYRTEC ) 10 MG tablet TAKE 1 TABLET (10 MG TOTAL) BY MOUTH DAILY AS NEEDED FOR ALLERGIES (CAN TAKE A N EXTRA DOSE DURING FLARE UPS.). 10/16/23   Kozlow, Camellia PARAS, MD  Cholecalciferol  (VITAMIN D3) 25 MCG (1000 UT) capsule Take 1,000 Units by mouth at bedtime.    [provider]  denosumab  (PROLIA ) 60 MG/ML SOSY injection Inject 60 mg into the skin every 6 (six) months.    [provider]  dexlansoprazole  (DEXILANT ) 60 MG capsule TAKE 1 CAPSULE (60 MG TOTAL) BY MOUTH IN THE MORNING 12/20/23   Kozlow, Camellia PARAS, MD  diclofenac  Sodium (VOLTAREN ) 1 % GEL Apply 1 Application topically as needed (pain). Patient not taking: Reported on 12/05/2023    [provider]  empagliflozin  (JARDIANCE ) 10 MG TABS tablet Take 1 tablet (10 mg total) by mouth daily. 07/05/23   [name], MD  ENTRESTO  24-26 MG TAKE 1 TABLET BY MOUTH TWICE A DAY 05/07/23   Lonni Slain, MD  EPINEPHRINE  0.3 mg/0.3 mL IJ SOAJ injection USE AS DIRECTED BY YOUR PHYSICIAN  INTRAMUSCULARLY AS NEEDED FOR ANAPHYLAXIS Patient not taking: Reported on 12/05/2023 10/09/22   Maurilio Camellia PARAS, MD  famotidine  (PEPCID ) 20 MG tablet TAKE 1 TABLET (20 MG) BY MOUTH DAILY BEFORE LUNCH AND1 TABLET BY MOUTH EACH NIGHT AT BEDTIME 05/07/23   Kozlow, Camellia PARAS, MD  fluticasone  (FLONASE ) 50 MCG/ACT nasal spray Place 2 sprays into both nostrils daily. 08/02/23   Aletha Bene, MD  Fluticasone -Umeclidin-Vilant (TRELEGY ELLIPTA ) 200-62.5-25 MCG/ACT AEPB Inhale 1 Inhalation into the lungs in the morning. 09/18/23   Kara Dorn NOVAK, MD  folic acid  (FOLVITE ) 1 MG tablet Take 1 tablet (1 mg total) by mouth daily. 03/04/23   [name], MD  furosemide  (LASIX ) 40 MG tablet  Take 0.5 tablets (20 mg total) by mouth daily as needed for fluid or edema (Weight gain, edema >3lbs). Monday and Thursday Patient not taking: Reported on 12/05/2023 08/22/23   Caleen Burgess BROCKS, MD  gabapentin  (NEURONTIN ) 300 MG capsule Take 1 capsule (300 mg total) by mouth at bedtime. 05/04/22   Patsy Lenis, MD  guaiFENesin  (MUCINEX ) 600 MG 12 hr tablet Take 600 mg by mouth 2 (two) times daily.    [provider]  HIZENTRA  10 GM/50ML SOLN Inject 15 g into the skin once a week. 13 g Infusion 01/14/23   Kozlow, Camellia PARAS, MD  Immune Globulin , Human,-slra (ASCENIV) 5 GM/50ML SOLN Inject 50 g into the vein every 28 (twenty-eight) days. 12/04/23   Kozlow, Camellia PARAS, MD  ipratropium-albuterol  (DUONEB) 0.5-2.5 (3) MG/3ML SOLN Take 3 mLs by nebulization every 6 (six) hours as needed. 07/22/23   Kara Dorn NOVAK, MD  Lactase (LACTOSE FAST ACTING RELIEF PO) Take 2 tablets by mouth 3 (three) times daily before meals. Patient not taking: Reported on 12/05/2023    [provider]  montelukast  (SINGULAIR ) 10 MG tablet Take 1 tablet (10 mg total) by mouth daily after supper. 03/04/23   [name], MD  mupirocin  ointment (BACTROBAN ) 2 % Apply 1 Application topically as needed (wounds). Patient not taking: Reported on 12/05/2023 01/29/23   Kozlow, Eric J, MD  nystatin  (MYCOSTATIN ) 100000 UNIT/ML suspension Swish, gargle, and swallow 5 ml after Trelegy use 05/24/23   Perri DELENA Meliton Mickey., MD  omeprazole (PRILOSEC) 20 MG capsule Take 20 mg by mouth daily. 10/03/23   [provider]  predniSONE  (DELTASONE ) 5 MG tablet Take 1 tablet (5 mg total) by mouth daily with breakfast. 05/13/23   Rice, Lonni ORN, MD  Prenatal Vit-Fe Fumarate-FA (PRENATAL VITAMINS PO) Take 1 tablet by mouth in the morning.    [provider]  Probiotic Product (ALIGN) 4 MG CAPS Take 4 mg by mouth every evening.    [provider]  Respiratory Therapy Supplies (NEBULIZER MASK ADULT) MISC 1 Device by  Does not apply route as directed. 01/01/23   Kozlow, Camellia PARAS, MD  rivaroxaban  (XARELTO ) 20 MG TABS tablet Take 1 tablet (20 mg total) by mouth daily with supper. 08/26/23   Amin, Ankit C, MD  sodium bicarbonate  650 MG tablet Take 650 mg by mouth daily.    [provider]  sodium chloride  HYPERTONIC 3 % nebulizer solution Take 4 mLs by nebulization at bedtime.    [provider]  traZODone  (DESYREL ) 50 MG tablet Take 100 mg by mouth at bedtime as needed. 09/03/23   [provider]    Allergies: Baclofen , Other, Porcine (pork) protein-containing drug products, Shellfish allergy , Shrimp extract, Tetracycline hcl, Xolair  [omalizumab ], Zoledronic  acid, Celecoxib, Dilaudid  [hydromorphone  hcl], Hydrocodone -acetaminophen , Levofloxacin , Molds &  smuts, Morphine  and codeine, Oxycodone  hcl, Paroxetine, Tetracycline hcl, Oxycodone -acetaminophen , Diltiazem  hcl, Dust mite extract, Gamunex [immune globulin ], Rituximab , Rofecoxib, and Tree extract    Review of Systems  Constitutional:  Positive for activity change and fever.  Neurological:  Positive for weakness.  All other systems reviewed and are negative.   Updated Vital Signs BP (!) 132/58   Pulse 78   Temp 98.9 F (37.2 C) (Oral)   Resp 16   SpO2 99%   Physical Exam Vitals and nursing note reviewed.  Constitutional:      Appearance: She is ill-appearing.  HENT:     Head: Normocephalic and atraumatic.     Right Ear: External ear normal.     Left Ear: External ear normal.     Nose: Nose normal.     Mouth/Throat:     Mouth: Mucous membranes are dry.  Eyes:     Extraocular Movements: Extraocular movements intact.     Conjunctiva/sclera: Conjunctivae normal.     Pupils: Pupils are equal, round, and reactive to light.  Cardiovascular:     Rate and Rhythm: Normal rate and regular rhythm.     Pulses: Normal pulses.     Heart sounds: Normal heart sounds.  Pulmonary:     Effort: Pulmonary effort is normal.     Breath  sounds: Normal breath sounds.  Abdominal:     General: Abdomen is flat. Bowel sounds are normal.     Palpations: Abdomen is soft.  Musculoskeletal:        General: Normal range of motion.     Cervical back: Normal range of motion and neck supple.  Skin:    General: Skin is warm.     Capillary Refill: Capillary refill takes less than 2 seconds.  Neurological:     General: No focal deficit present.     Mental Status: She is alert and oriented to person, place, and time.  Psychiatric:        Mood and Affect: Mood normal.        Behavior: Behavior normal.     (all labs ordered are listed, but only abnormal results are displayed) Labs Reviewed  COMPREHENSIVE METABOLIC PANEL WITH GFR - Abnormal; Notable for the following components:      Result Value   Glucose, Bld 117 (*)    BUN 24 (*)    Creatinine, Ser 1.25 (*)    Total Protein 6.1 (*)    Albumin 3.1 (*)    AST 44 (*)    GFR, Estimated 45 (*)    All other components within normal limits  CBC WITH DIFFERENTIAL/PLATELET - Abnormal; Notable for the following components:   RBC 3.03 (*)    Hemoglobin 10.2 (*)    HCT 32.9 (*)    MCV 108.6 (*)    RDW 20.1 (*)    Platelets 115 (*)    Lymphs Abs 0.6 (*)    Monocytes Absolute 1.2 (*)    All other components within normal limits  URINALYSIS, W/ REFLEX TO CULTURE (INFECTION SUSPECTED) - Abnormal; Notable for the following components:   Glucose, UA >=500 (*)    Bacteria, UA MANY (*)    All other components within normal limits  I-STAT CG4 LACTIC ACID , ED - Abnormal; Notable for the following components:   Lactic Acid , Venous 2.1 (*)    All other components within normal limits  I-STAT CG4 LACTIC ACID , ED - Abnormal; Notable for the following components:   Lactic Acid , Venous 2.3 (*)  All other components within normal limits  RESP PANEL BY RT-PCR (RSV, FLU A&B, COVID)  RVPGX2  CULTURE, BLOOD (ROUTINE X 2)  CULTURE, BLOOD (ROUTINE X 2)  PROTIME-INR  I-STAT CG4 LACTIC ACID , ED     EKG: None  Radiology: CT CHEST ABDOMEN PELVIS W CONTRAST Result Date: 12/25/2023 CLINICAL DATA:  Sepsis lethargy and vomiting EXAM: CT CHEST, ABDOMEN, AND PELVIS WITH CONTRAST TECHNIQUE: Multidetector CT imaging of the chest, abdomen and pelvis was performed following the standard protocol during bolus administration of intravenous contrast. RADIATION DOSE REDUCTION: This exam was performed according to the departmental dose-optimization program which includes automated exposure control, adjustment of the mA and/or kV according to patient size and/or use of iterative reconstruction technique. CONTRAST:  80mL OMNIPAQUE  IOHEXOL  300 MG/ML  SOLN COMPARISON:  Chest x-ray 12/25/2023, CT 07/31/2023, 03/29/2023 FINDINGS: CT CHEST FINDINGS Cardiovascular: Moderate aortic atherosclerosis. No aneurysm. Multi vessel coronary vascular calcification. Normal cardiac size. No pericardial effusion Mediastinum/Nodes: Patent trachea. No thyroid  mass. No suspicious lymph nodes. Esophagus within normal limits. Lungs/Pleura: No pleural effusion or pneumothorax. Bandlike scarring in the lower lobes. Mild consolidation and ground-glass disease in the posterior right upper lobe. Musculoskeletal: No acute osseous abnormality. CT ABDOMEN PELVIS FINDINGS Hepatobiliary: Hepatic steatosis. Cholecystectomy. No biliary dilatation Pancreas: Unremarkable. No pancreatic ductal dilatation or surrounding inflammatory changes. Spleen: Normal in size without focal abnormality. Adrenals/Urinary Tract: Normal adrenal glands. Slightly enlarged extrarenal pelvises without hydroureter. No obstructing stone. Moderate urinary bladder distension Stomach/Bowel: The stomach is nonenlarged. No dilated small bowel. No acute bowel wall thickening. Diverticular disease of the left colon. Vascular/Lymphatic: Moderate aortic atherosclerosis. No aneurysm. No suspicious lymph nodes. Reproductive: Hysterectomy.  No adnexal mass Other: Negative for ascites or  free air Musculoskeletal: Postsurgical changes of the left breast. Chronic soft tissue calcifications overlying left hip. No acute osseous abnormality. IMPRESSION: 1. Mild consolidation and ground-glass disease in the posterior right upper lobe, possible pneumonia or aspiration. 2. No CT evidence for acute intra-abdominal or pelvic abnormality. 3. Hepatic steatosis. 4. Diverticular disease of the left colon without acute inflammatory process. 5. Aortic atherosclerosis. Aortic Atherosclerosis (ICD10-I70.0). Electronically Signed   By: Luke Bun M.D.   On: 12/25/2023 21:07   DG Chest Port 1 View Result Date: 12/25/2023 EXAM: 1 VIEW(S) XRAY OF THE CHEST 12/25/2023 06:45:00 PM COMPARISON: 08/22/2023 CLINICAL HISTORY: Questionable sepsis - evaluate for abnormality FINDINGS: LINES, TUBES AND DEVICES: Right PICC removed. LUNGS AND PLEURA: Low lung volume. No focal pulmonary opacity. No pulmonary edema. No pleural effusion. No pneumothorax. HEART AND MEDIASTINUM: Stable mild cardiomegaly. BONES AND SOFT TISSUES: Left chest wall surgical clips. Right shoulder arthroplasty noted. IMPRESSION: 1. No acute cardiopulmonary process. 2. Stable mild cardiomegaly. Electronically signed by: Luke Bun MD 12/25/2023 07:07 PM EST RP Workstation: [id number]     Procedures   Medications Ordered in the ED  lactated ringers  infusion ( Intravenous New Bag/Given 12/25/23 1741)  aspirin  suppository 300 mg (300 mg Rectal Not Given 12/25/23 2102)  lactated ringers  bolus 1,000 mL (1,000 mLs Intravenous New Bag/Given 12/25/23 1719)    And  lactated ringers  bolus 500 mL (500 mLs Intravenous New Bag/Given 12/25/23 1829)    And  lactated ringers  bolus 250 mL (250 mLs Intravenous New Bag/Given 12/25/23 1915)  ceFEPIme  (MAXIPIME ) 2 g in sodium chloride  0.9 % 100 mL IVPB (0 g Intravenous Stopped 12/25/23 1909)  metroNIDAZOLE  (FLAGYL ) IVPB 500 mg (500 mg Intravenous New Bag/Given 12/25/23 1802)  vancomycin  (VANCOCIN ) IVPB 1000 mg/200 mL  premix (0 mg Intravenous Stopped 12/25/23 1901)  iohexol  (OMNIPAQUE ) 300 MG/ML solution 80 mL (80 mLs Intravenous Contrast Given 12/25/23 2017)                                    Medical Decision Making Amount and/or Complexity of Data Reviewed Labs: ordered. Radiology: ordered.  Risk OTC drugs. Prescription drug management. Decision regarding hospitalization.   This patient presents to the ED for concern of fever, this involves an extensive number of treatment options, and is a complaint that carries with it a high risk of complications and morbidity.  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  The pertinent results include:  cbc with nl wbc; hgb low at 10.2, plt low at 115 (hgb 9.0 and plt 209 on 8/12); cmp with bun 24 and cr 1.25 (bun 30 and cr 1.44 on 10/16); lactic acid  2.1 initially and went up to 2.3, but now down to 1.8    Imaging Studies ordered:  I ordered imaging studies including cxr, ct chest/abd/pelvis  I independently visualized and interpreted imaging which showed  CXR:  No acute cardiopulmonary process.  2. Stable mild cardiomegaly.  CT chest/abd/pelvis: Mild consolidation and ground-glass disease in the posterior  right upper lobe, possible pneumonia or aspiration.  2. No CT evidence for acute intra-abdominal or pelvic abnormality.  3. Hepatic steatosis.  4. Diverticular disease of the left colon without acute inflammatory  process.  5. Aortic atherosclerosis.    Aortic Atherosclerosis (ICD10-I70.0).   I agree with the radiologist  interpretation   Cardiac Monitoring:  The patient was maintained on a cardiac monitor.  I personally viewed and interpreted the cardiac monitored which showed an underlying rhythm of: nsr   Medicines ordered and prescription drug management:  I ordered medication including ivfs/maxipime /vancomycin /flagyl   for sx  Reevaluation of the patient after these medicines showed that the patient improved I have reviewed the patients home medicines and have made adjustments as needed   Test Considered:  ct   Critical Interventions:  Ivfs/ct   Consultations Obtained:  I requested consultation with the hospitalist (Dr. Franky),  and discussed lab and imaging findings as well as pertinent plan - he will admit   Problem List / ED Course:  Aspiration pna:  iv abx given.  O2 sat did drop to 89% on RA, so she was put on 2L and O2 sat up to normal.  Sepsis:  code sepsis called.  Vitals improving with ivfs.  Lactic acid  now down to normal after ivfs.   Reevaluation:  After the interventions noted above, I reevaluated the patient and found that they have :improved   Social Determinants of Health:  Lives at home   Dispostion:  After consideration of the diagnostic results and the patients response to treatment, I feel that the patent would benefit from admission.  CRITICAL CARE Performed by: Mliss Boyers   Total critical care time: 30 minutes  Critical care time was exclusive of separately billable procedures and treating other patients.  Critical care was necessary to treat or prevent imminent or life-threatening deterioration.  Critical care was time  spent personally by me on the following activities: development of treatment plan with patient and/or surrogate as well as nursing, discussions with consultants, evaluation of patient's response to treatment, examination of patient, obtaining history from patient or surrogate, ordering and performing treatments and  interventions, ordering and review of laboratory studies, ordering and review of radiographic studies, pulse oximetry and re-evaluation of patient's condition.        Final diagnoses:  Sepsis without acute organ dysfunction, due to unspecified organism [facility])  Aspiration pneumonia of right upper lobe due to gastric secretions [facility])    ED Discharge Orders     None          Dean Clarity, MD 12/25/23 2143

## 2023-12-26 DIAGNOSIS — N1832 Chronic kidney disease, stage 3b: Secondary | ICD-10-CM | POA: Diagnosis present

## 2023-12-26 DIAGNOSIS — E0590 Thyrotoxicosis, unspecified without thyrotoxic crisis or storm: Secondary | ICD-10-CM | POA: Diagnosis present

## 2023-12-26 DIAGNOSIS — I420 Dilated cardiomyopathy: Secondary | ICD-10-CM | POA: Diagnosis present

## 2023-12-26 DIAGNOSIS — A419 Sepsis, unspecified organism: Secondary | ICD-10-CM | POA: Diagnosis not present

## 2023-12-26 DIAGNOSIS — E7800 Pure hypercholesterolemia, unspecified: Secondary | ICD-10-CM | POA: Diagnosis present

## 2023-12-26 DIAGNOSIS — Z66 Do not resuscitate: Secondary | ICD-10-CM | POA: Diagnosis present

## 2023-12-26 DIAGNOSIS — J690 Pneumonitis due to inhalation of food and vomit: Secondary | ICD-10-CM | POA: Diagnosis present

## 2023-12-26 DIAGNOSIS — E872 Acidosis, unspecified: Secondary | ICD-10-CM | POA: Diagnosis present

## 2023-12-26 DIAGNOSIS — I255 Ischemic cardiomyopathy: Secondary | ICD-10-CM | POA: Diagnosis present

## 2023-12-26 DIAGNOSIS — D631 Anemia in chronic kidney disease: Secondary | ICD-10-CM | POA: Diagnosis present

## 2023-12-26 DIAGNOSIS — J189 Pneumonia, unspecified organism: Secondary | ICD-10-CM | POA: Diagnosis not present

## 2023-12-26 DIAGNOSIS — Z7984 Long term (current) use of oral hypoglycemic drugs: Secondary | ICD-10-CM | POA: Diagnosis not present

## 2023-12-26 DIAGNOSIS — M797 Fibromyalgia: Secondary | ICD-10-CM | POA: Diagnosis present

## 2023-12-26 DIAGNOSIS — D801 Nonfamilial hypogammaglobulinemia: Secondary | ICD-10-CM | POA: Diagnosis present

## 2023-12-26 DIAGNOSIS — E1122 Type 2 diabetes mellitus with diabetic chronic kidney disease: Secondary | ICD-10-CM | POA: Diagnosis present

## 2023-12-26 DIAGNOSIS — G894 Chronic pain syndrome: Secondary | ICD-10-CM | POA: Diagnosis present

## 2023-12-26 DIAGNOSIS — Z1152 Encounter for screening for COVID-19: Secondary | ICD-10-CM | POA: Diagnosis not present

## 2023-12-26 DIAGNOSIS — E1140 Type 2 diabetes mellitus with diabetic neuropathy, unspecified: Secondary | ICD-10-CM | POA: Diagnosis present

## 2023-12-26 DIAGNOSIS — G9341 Metabolic encephalopathy: Secondary | ICD-10-CM | POA: Diagnosis present

## 2023-12-26 DIAGNOSIS — D696 Thrombocytopenia, unspecified: Secondary | ICD-10-CM | POA: Diagnosis present

## 2023-12-26 DIAGNOSIS — I2510 Atherosclerotic heart disease of native coronary artery without angina pectoris: Secondary | ICD-10-CM | POA: Diagnosis present

## 2023-12-26 DIAGNOSIS — E039 Hypothyroidism, unspecified: Secondary | ICD-10-CM | POA: Diagnosis present

## 2023-12-26 DIAGNOSIS — Z7901 Long term (current) use of anticoagulants: Secondary | ICD-10-CM | POA: Diagnosis not present

## 2023-12-26 DIAGNOSIS — Z8249 Family history of ischemic heart disease and other diseases of the circulatory system: Secondary | ICD-10-CM | POA: Diagnosis not present

## 2023-12-26 DIAGNOSIS — K219 Gastro-esophageal reflux disease without esophagitis: Secondary | ICD-10-CM | POA: Diagnosis present

## 2023-12-26 LAB — BASIC METABOLIC PANEL WITH GFR
Anion gap: 8 (ref 5–15)
BUN: 19 mg/dL (ref 8–23)
CO2: 24 mmol/L (ref 22–32)
Calcium: 9.3 mg/dL (ref 8.9–10.3)
Chloride: 106 mmol/L (ref 98–111)
Creatinine, Ser: 1.2 mg/dL — ABNORMAL HIGH (ref 0.44–1.00)
GFR, Estimated: 47 mL/min — ABNORMAL LOW (ref >60)
Glucose, Bld: 82 mg/dL (ref 70–99)
Potassium: 4.6 mmol/L (ref 3.5–5.1)
Sodium: 138 mmol/L (ref 135–145)

## 2023-12-26 LAB — GLUCOSE, CAPILLARY
Glucose-Capillary: 88 mg/dL (ref 70–99)
Glucose-Capillary: 147 mg/dL — ABNORMAL HIGH (ref 70–99)
Glucose-Capillary: 146 mg/dL — ABNORMAL HIGH (ref 70–99)
Glucose-Capillary: 252 mg/dL — ABNORMAL HIGH (ref 70–99)

## 2023-12-26 LAB — CBC
HCT: 32.7 % — ABNORMAL LOW (ref 36.0–46.0)
Hemoglobin: 10.1 g/dL — ABNORMAL LOW (ref 12.0–15.0)
MCH: 33.6 pg (ref 26.0–34.0)
MCHC: 30.9 g/dL (ref 30.0–36.0)
MCV: 108.6 fL — ABNORMAL HIGH (ref 80.0–100.0)
Platelets: 125 K/uL — ABNORMAL LOW (ref 150–400)
RBC: 3.01 MIL/uL — ABNORMAL LOW (ref 3.87–5.11)
RDW: 20.4 % — ABNORMAL HIGH (ref 11.5–15.5)
WBC: 10.4 K/uL (ref 4.0–10.5)
nRBC: 0 % (ref 0.0–0.2)

## 2023-12-26 MED ORDER — GABAPENTIN 300 MG PO CAPS
300.0000 mg | ORAL_CAPSULE | Freq: Every day | ORAL | Status: DC
  Administered 2023-12-26 – 2023-12-28 (×4): 300 mg via ORAL
  Filled 2023-12-26 (×4): qty 1

## 2023-12-26 MED ORDER — HYDROCORTISONE SOD SUC (PF) 100 MG IJ SOLR
50.0000 mg | Freq: Two times a day (BID) | INTRAMUSCULAR | Status: AC
  Administered 2023-12-26 – 2023-12-27 (×3): 50 mg via INTRAVENOUS
  Filled 2023-12-26 (×3): qty 2

## 2023-12-26 MED ORDER — ACETAMINOPHEN 325 MG PO TABS
650.0000 mg | ORAL_TABLET | Freq: Four times a day (QID) | ORAL | Status: DC | PRN
  Administered 2023-12-27 – 2023-12-28 (×5): 650 mg via ORAL
  Filled 2023-12-26 (×6): qty 2

## 2023-12-26 MED ORDER — EMPAGLIFLOZIN 10 MG PO TABS
10.0000 mg | ORAL_TABLET | Freq: Every day | ORAL | Status: DC
  Filled 2023-12-26: qty 1

## 2023-12-26 MED ORDER — LACTATED RINGERS IV SOLN: INTRAVENOUS | Status: DC

## 2023-12-26 MED ORDER — ACETAMINOPHEN 500 MG PO TABS
1000.0000 mg | ORAL_TABLET | Freq: Once | ORAL | Status: AC
  Administered 2023-12-26: 1000 mg via ORAL
  Filled 2023-12-26: qty 2

## 2023-12-26 MED ORDER — MAGNESIUM OXIDE -MG SUPPLEMENT 400 (240 MG) MG PO TABS
400.0000 mg | ORAL_TABLET | Freq: Every day | ORAL | Status: DC
  Administered 2023-12-26 – 2023-12-28 (×4): 400 mg via ORAL
  Filled 2023-12-26 (×4): qty 1

## 2023-12-26 MED ORDER — SODIUM CHLORIDE 0.9 % IV SOLN
2.0000 g | INTRAVENOUS | Status: DC
  Administered 2023-12-26 – 2023-12-29 (×4): 2 g via INTRAVENOUS
  Filled 2023-12-26 (×4): qty 20

## 2023-12-26 MED ORDER — RIVAROXABAN 20 MG PO TABS
20.0000 mg | ORAL_TABLET | Freq: Every day | ORAL | Status: DC
  Administered 2023-12-26 – 2023-12-28 (×4): 20 mg via ORAL
  Filled 2023-12-26 (×4): qty 1

## 2023-12-26 MED ORDER — PRENATAL MULTIVITAMIN CH
1.0000 | ORAL_TABLET | Freq: Every day | ORAL | Status: DC
  Administered 2023-12-26 – 2023-12-28 (×3): 1 via ORAL
  Filled 2023-12-26 (×4): qty 1

## 2023-12-26 MED ORDER — BUDESON-GLYCOPYRROL-FORMOTEROL 160-9-4.8 MCG/ACT IN AERO
2.0000 | INHALATION_SPRAY | Freq: Two times a day (BID) | RESPIRATORY_TRACT | Status: DC
  Administered 2023-12-26 – 2023-12-29 (×7): 2 via RESPIRATORY_TRACT
  Filled 2023-12-26: qty 5.9

## 2023-12-26 MED ORDER — ACETAMINOPHEN 650 MG RE SUPP: 650.0000 mg | Freq: Four times a day (QID) | RECTAL | Status: DC | PRN

## 2023-12-26 MED ORDER — ATORVASTATIN CALCIUM 40 MG PO TABS
80.0000 mg | ORAL_TABLET | Freq: Every evening | ORAL | Status: DC
  Administered 2023-12-26 – 2023-12-28 (×4): 80 mg via ORAL
  Filled 2023-12-26 (×5): qty 2

## 2023-12-26 MED ORDER — ALLOPURINOL 100 MG PO TABS
100.0000 mg | ORAL_TABLET | Freq: Every day | ORAL | Status: DC
  Administered 2023-12-26 – 2023-12-28 (×4): 100 mg via ORAL
  Filled 2023-12-26 (×4): qty 1

## 2023-12-26 MED ORDER — INSULIN ASPART 100 UNIT/ML IJ SOLN
0.0000 [IU] | Freq: Three times a day (TID) | INTRAMUSCULAR | Status: DC
  Administered 2023-12-27: 3 [IU] via SUBCUTANEOUS
  Administered 2023-12-27: 2 [IU] via SUBCUTANEOUS
  Filled 2023-12-26 (×2): qty 2
  Filled 2023-12-26: qty 3

## 2023-12-26 MED ORDER — PANTOPRAZOLE SODIUM 40 MG PO TBEC
40.0000 mg | DELAYED_RELEASE_TABLET | Freq: Every day | ORAL | Status: DC
  Administered 2023-12-26 – 2023-12-29 (×4): 40 mg via ORAL
  Filled 2023-12-26 (×4): qty 1

## 2023-12-26 MED ORDER — FOLIC ACID 1 MG PO TABS
1.0000 mg | ORAL_TABLET | Freq: Every day | ORAL | Status: DC
  Administered 2023-12-26 – 2023-12-29 (×4): 1 mg via ORAL
  Filled 2023-12-26 (×4): qty 1

## 2023-12-26 MED ORDER — POTASSIUM CHLORIDE CRYS ER 10 MEQ PO TBCR
10.0000 meq | EXTENDED_RELEASE_TABLET | Freq: Every day | ORAL | Status: DC
  Administered 2023-12-26 – 2023-12-29 (×4): 10 meq via ORAL
  Filled 2023-12-26 (×6): qty 1

## 2023-12-26 MED ORDER — SODIUM CHLORIDE 0.9 % IV SOLN
500.0000 mg | INTRAVENOUS | Status: DC
  Administered 2023-12-26 – 2023-12-29 (×4): 500 mg via INTRAVENOUS
  Filled 2023-12-26 (×4): qty 5

## 2023-12-26 MED ORDER — PREDNISONE 5 MG PO TABS
5.0000 mg | ORAL_TABLET | Freq: Every day | ORAL | Status: DC
  Administered 2023-12-26 – 2023-12-29 (×4): 5 mg via ORAL
  Filled 2023-12-26 (×4): qty 1

## 2023-12-26 MED ORDER — SACUBITRIL-VALSARTAN 24-26 MG PO TABS
1.0000 | ORAL_TABLET | Freq: Two times a day (BID) | ORAL | Status: DC
  Administered 2023-12-26: 1 via ORAL
  Filled 2023-12-26 (×2): qty 1

## 2023-12-26 MED ORDER — FAMOTIDINE 20 MG PO TABS
20.0000 mg | ORAL_TABLET | Freq: Every day | ORAL | Status: DC
  Administered 2023-12-26 – 2023-12-28 (×4): 20 mg via ORAL
  Filled 2023-12-26 (×4): qty 1

## 2023-12-26 MED ORDER — LACTATED RINGERS IV BOLUS
250.0000 mL | Freq: Once | INTRAVENOUS | Status: AC
  Administered 2023-12-26: 250 mL via INTRAVENOUS

## 2023-12-26 MED ORDER — MONTELUKAST SODIUM 10 MG PO TABS
10.0000 mg | ORAL_TABLET | Freq: Every day | ORAL | Status: DC
  Administered 2023-12-26 – 2023-12-28 (×3): 10 mg via ORAL
  Filled 2023-12-26 (×4): qty 1

## 2023-12-26 NOTE — Progress Notes (Signed)
   12/26/23 0759  Assess: MEWS Score  Temp (!) 103.2 F (39.6 C)  BP (!) 126/57  MAP (mmHg) 76  Pulse Rate 86  Resp 18  SpO2 92 %  O2 Device Room Air  Assess: MEWS Score  MEWS Temp 2  MEWS Systolic 0  MEWS Pulse 0  MEWS RR 0  MEWS LOC 0  MEWS Score 2  MEWS Score Color Yellow  Assess: if the MEWS score is Yellow or Red  Were vital signs accurate and taken at a resting state? Yes  Notify: Charge Nurse/RN  Name of Charge Nurse/RN Notified Hanah  Provider Notification  Provider Name/Title Madelyne Serum  Date Provider Notified 12/26/23  Time Provider Notified [phone]  Method of Notification Page  Notification Reason Change in status  Provider response See new orders  Date of Provider Response 12/26/23  Time of Provider Response 0815  Assess: SIRS CRITERIA  SIRS Temperature  1  SIRS Respirations  0  SIRS Pulse 0  SIRS WBC 0  SIRS Score Sum  1

## 2023-12-26 NOTE — Progress Notes (Signed)
   12/26/23 2218  BiPAP/CPAP/SIPAP  BiPAP/CPAP/SIPAP Pt Type Adult  BiPAP/CPAP/SIPAP Resmed  Mask Type Full face mask  Dentures removed? Not applicable  Mask Size Small  Flow Rate 2 lpm  Patient Home Machine No  Patient Home Mask No  Patient Home Tubing No  Auto Titrate Yes  Minimum cmH2O 4 cmH2O  Maximum cmH2O 18 cmH2O  CPAP/SIPAP surface wiped down Yes  Device Plugged into RED Power Outlet Yes  BiPAP/CPAP /SiPAP Vitals  Pulse Rate 74  Resp 17  SpO2 96 %  Bilateral Breath Sounds Clear;Diminished  MEWS Score/Color  MEWS Score 0  MEWS Score Color Landy

## 2023-12-26 NOTE — Plan of Care (Signed)
  Problem: Education: Goal: Knowledge of General Education information will improve Description: Including pain rating scale, medication(s)/side effects and non-pharmacologic comfort measures Outcome: Progressing   Problem: Clinical Measurements: Goal: Will remain free from infection Outcome: Progressing Goal: Diagnostic test results will improve Outcome: Progressing Goal: Respiratory complications will improve Outcome: Progressing Goal: Cardiovascular complication will be avoided Outcome: Progressing   Problem: Nutrition: Goal: Adequate nutrition will be maintained Outcome: Progressing   Problem: Safety: Goal: Ability to remain free from injury will improve Outcome: Progressing   Problem: Skin Integrity: Goal: Risk for impaired skin integrity will decrease Outcome: Progressing

## 2023-12-26 NOTE — Progress Notes (Addendum)
 PROGRESS NOTE    Ebony Scott  FMW:[id number] DOB: 07/08/1950 DOA: 12/25/2023 PCP: [name], MD   Brief Narrative: 73 year old with past medical history significant for CAD status post PCI, ischemic cardiomyopathy, diabetes type 2, sleep apnea, history of DVT, hypogammaglobulinemia, rheumatoid arthritis recently treated this year for Enterococcus faecalis bacteremia and ICD was removed at that time, brought by family due to altered mental status, patient noted to be more lethargic.  Evaluation in the ED she was found to be febrile temperature 103, hypotensive blood pressure in the 90s.  Lactic acid  elevated at 2.3. CT chest abdomen and pelvis showed features concerning for pneumonia, aspiration.  COVID and flu test negative. Of note patient received the Covid vaccine  the day prior to admission.   Assessment & Plan:   Principal Problem:   Sepsis (HCC) Active Problems:   Hypogammaglobulinemia   Chronic kidney disease, stage 3b (HCC) - baseline Scr 1.3-1.5   Rheumatoid arthritis (HCC)   History of pulmonary embolus (PE)   DMII (diabetes mellitus, type 2) (HCC)   DNR (do not resuscitate)   Nocturnal hypoxia - 2-3 L/min   Hyperthyroidism   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HCC)    CAP (community acquired pneumonia)   1-Sepsis from aspiration pneumonia -Patient presented with altered mental status, temperature 102, hypotensive, elevated lactic acid  at 2.3.  Tachypnea.  -Daughter reported that patient probably vomited at home. - CT chest: Mild consolidation and ground glass disease in the posterior right upper lobe, possible pneumonia or aspiration. - Continue IV ceftriaxone  and azithromycin  -Follow Blood culture -Continue to spike fever, supportive care. -Start low rate IV fluids -IV bolus SBP in the 90  2-Ischemic cardiomyopathy status post extraction of ICD in July 2025 patient had Enterococcus faecalis bacteremia.  Last ejection fraction was June 2025 which showed  ejection fraction 25 to 30%. - Patient hypotensive and septic will hold Entresto  and Jardiance  - Monitor on  low rate IV fluids  2-CAD status post ICD removal: Continue statin, aspirin  and Xarelto   Rheumatoid arthritis on chronic prednisone .  Will give a stress dose of steroid, times 2 doses ,   Chronic kidney disease stage IIIb: Monitor creatinine Cr Range 1.4---1.2  Anemia likely of renal disease. Monitor hemoglobin  GERD: Continue with PPI  Diabetes type 2: Sliding scale insulin  last hemoglobin A1c 6.5  Hypogammaglobinemia ;gets IVIG infusion  History of PE: Continue with Xarelto   Chronic pain: Continue with gabapentin      Estimated body mass index is 24.14 kg/m as calculated from the following:   Height as of 12/05/23: 5' (1.524 m).   Weight as of 12/05/23: 56.1 kg.   DVT prophylaxis: Xarelto  Code Status: DNR Family Communication: Daughter who was at bedside Disposition Plan:  Status is: Observation The patient remains OBS appropriate and will d/c before 2 midnights.    Consultants:  none  Procedures:  none  Antimicrobials:    Subjective: Patient is very sleepy, open eyes to voice and fall back to sleep.  She is having high fever temperature 103.  Daughter at bedside said that every time she gets fever gets lethargic. Family think patient vomited yesterday they found her clothes dirty with vomit content.   Objective: Vitals:   12/26/23 0822 12/26/23 0845 12/26/23 0953 12/26/23 1106  BP:  (!) 109/55  (!) 98/49  Pulse:  85  73  Resp:  16  16  Temp:   98.9 F (37.2 C) 98.8 F (37.1 C)  TempSrc:   Oral Oral  SpO2:  93% 94%  95%    Intake/Output Summary (Last 24 hours) at 12/26/2023 1136 Last data filed at 12/26/2023 0601 Gross per 24 hour  Intake 1898.81 ml  Output 500 ml  Net 1398.81 ml   There were no vitals filed for this visit.  Examination:  General exam: Appears calm and comfortable  Respiratory system:, Creased breath sounds  respiratory effort normal. Cardiovascular system: S1 & S2 heard, RRR. Gastrointestinal system: Abdomen is nondistended, soft and nontender. No organomegaly or masses felt. Normal bowel sounds heard. Central nervous system; sleepy open eyes to voice, falls back to sleep Extremities: Symmetric 5 x 5 power.     Data Reviewed: I have personally reviewed following labs and imaging studies  CBC: Recent Labs  Lab 12/25/23 1901 12/26/23 0552  WBC 8.1 10.4  NEUTROABS 5.9  --   HGB 10.2* 10.1*  HCT 32.9* 32.7*  MCV 108.6* 108.6*  PLT 115* 125*   Basic Metabolic Panel: Recent Labs  Lab 12/25/23 1901 12/26/23 0552  NA 138 138  K 4.2 4.6  CL 106 106  CO2 22 24  GLUCOSE 117* 82  BUN 24* 19  CREATININE 1.25* 1.20*  CALCIUM  9.0 9.3   GFR: CrCl cannot be calculated (Unknown ideal weight.). Liver Function Tests: Recent Labs  Lab 12/25/23 1901  AST 44*  ALT 40  ALKPHOS 51  BILITOT 0.7  PROT 6.1*  ALBUMIN 3.1*   No results for input(s): LIPASE, AMYLASE in the last 168 hours. No results for input(s): AMMONIA in the last 168 hours. Coagulation Profile: Recent Labs  Lab 12/25/23 1901  INR 1.1   Cardiac Enzymes: No results for input(s): CKTOTAL, CKMB, CKMBINDEX, TROPONINI in the last 168 hours. BNP (last 3 results) No results for input(s): PROBNP in the last 8760 hours. HbA1C: No results for input(s): HGBA1C in the last 72 hours. CBG: Recent Labs  Lab 12/26/23 0801 12/26/23 1129  GLUCAP 88 147*   Lipid Profile: No results for input(s): CHOL, HDL, LDLCALC, TRIG, CHOLHDL, LDLDIRECT in the last 72 hours. Thyroid  Function Tests: No results for input(s): TSH, T4TOTAL, FREET4, T3FREE, THYROIDAB in the last 72 hours. Anemia Panel: No results for input(s): VITAMINB12, FOLATE, FERRITIN, TIBC, IRON , RETICCTPCT in the last 72 hours. Sepsis Labs: Recent Labs  Lab 12/25/23 1726 12/25/23 1901 12/25/23 2126  LATICACIDVEN  2.1* 2.3* 1.8    Recent Results (from the past 240 hours)  Blood Culture (routine x 2)     Status: None (Preliminary result)   Collection Time: 12/25/23  5:18 PM   Specimen: BLOOD  Result Value Ref Range Status   Specimen Description   Final    BLOOD LEFT ANTECUBITAL Performed at [facility], [address] [place], [place] [address]    Special Requests   Final    BOTTLES DRAWN AEROBIC AND ANAEROBIC Blood Culture adequate volume Performed at [facility], [address] [place], [place] [address]    Culture  Setup Time NO ORGANISMS SEEN AEROBIC BOTTLE ONLY   Final   Culture   Final    NO GROWTH < 12 HOURS Performed at [facility], [address] [place], [place] [address]    Report Status PENDING  Incomplete  Blood Culture (routine x 2)     Status: None (Preliminary result)   Collection Time: 12/25/23  5:32 PM   Specimen: BLOOD  Result Value Ref Range Status   Specimen Description   Final    BLOOD BLOOD RIGHT ARM Performed  at [facility], [address], [place], [place] [address]    Special Requests   Final    BOTTLES DRAWN AEROBIC AND ANAEROBIC Blood Culture adequate volume Performed at [facility], [address] [place], [place] [address]    Culture   Final    NO GROWTH < 12 HOURS Performed at [facility], [address] [place], [place] [address]    Report Status PENDING  Incomplete  Resp panel by RT-PCR (RSV, Flu A&B, Covid)     Status: None   Collection Time: 12/25/23  9:48 PM   Specimen: Nasal Swab  Result Value Ref Range Status   SARS Coronavirus 2 by RT PCR NEGATIVE NEGATIVE Final    Comment: (NOTE) SARS-CoV-2 target nucleic acids are NOT DETECTED.  The SARS-CoV-2 RNA is generally detectable in upper respiratory specimens during the acute phase of infection. The lowest concentration of SARS-CoV-2 viral copies this assay can detect is 138 copies/mL. A negative  result does not preclude SARS-Cov-2 infection and should not be used as the sole basis for treatment or other patient management decisions. A negative result may occur with  improper specimen collection/handling, submission of specimen other than nasopharyngeal swab, presence of viral mutation(s) within the areas targeted by this assay, and inadequate number of viral copies(<138 copies/mL). A negative result must be combined with clinical observations, patient history, and epidemiological information. The expected result is Negative.  Fact Sheet for Patients:  bloggercourse.com  Fact Sheet for Healthcare Providers:  seriousbroker.it  This test is no t yet approved or cleared by the [place]  FDA and  has been authorized for detection and/or diagnosis of SARS-CoV-2 by FDA under an Emergency Use Authorization (EUA). This EUA will remain  in effect (meaning this test can be used) for the duration of the COVID-19 declaration under Section 564(b)(1) of the Act, 21 U.S.C.section 360bbb-3(b)(1), unless the authorization is terminated  or revoked sooner.       Influenza A by PCR NEGATIVE NEGATIVE Final   Influenza B by PCR NEGATIVE NEGATIVE Final    Comment: (NOTE) The Xpert Xpress SARS-CoV-2/FLU/RSV plus assay is intended as an aid in the diagnosis of influenza from Nasopharyngeal swab specimens and should not be used as a sole basis for treatment. Nasal washings and aspirates are unacceptable for Xpert Xpress SARS-CoV-2/FLU/RSV testing.  Fact Sheet for Patients: bloggercourse.com  Fact Sheet for Healthcare Providers: seriousbroker.it  This test is not yet approved or cleared by the [place]  FDA and has been authorized for detection and/or diagnosis of SARS-CoV-2 by FDA under an Emergency Use Authorization (EUA). This EUA will remain in effect (meaning this test can be used)  for the duration of the COVID-19 declaration under Section 564(b)(1) of the Act, 21 U.S.C. section 360bbb-3(b)(1), unless the authorization is terminated or revoked.     Resp Syncytial Virus by PCR NEGATIVE NEGATIVE Final    Comment: (NOTE) Fact Sheet for Patients: bloggercourse.com  Fact Sheet for Healthcare Providers: seriousbroker.it  This test is not yet approved or cleared by the [place]  FDA and has been authorized for detection and/or diagnosis of SARS-CoV-2 by FDA under an Emergency Use Authorization (EUA). This EUA will remain in effect (meaning this test can be used) for the duration of the COVID-19 declaration under Section 564(b)(1) of the Act, 21 U.S.C. section 360bbb-3(b)(1), unless the authorization is terminated or revoked.  Performed at [facility], [address], [place], [place] [address]  Radiology Studies: CT CHEST ABDOMEN PELVIS W CONTRAST Result Date: 12/25/2023 CLINICAL DATA:  Sepsis lethargy and vomiting EXAM: CT CHEST, ABDOMEN, AND PELVIS WITH CONTRAST TECHNIQUE: Multidetector CT imaging of the chest, abdomen and pelvis was performed following the standard protocol during bolus administration of intravenous contrast. RADIATION DOSE REDUCTION: This exam was performed according to the departmental dose-optimization program which includes automated exposure control, adjustment of the mA and/or kV according to patient size and/or use of iterative reconstruction technique. CONTRAST:  80mL OMNIPAQUE  IOHEXOL  300 MG/ML  SOLN COMPARISON:  Chest x-ray 12/25/2023, CT 07/31/2023, 03/29/2023 FINDINGS: CT CHEST FINDINGS Cardiovascular: Moderate aortic atherosclerosis. No aneurysm. Multi vessel coronary vascular calcification. Normal cardiac size. No pericardial effusion Mediastinum/Nodes: Patent trachea. No thyroid  mass. No suspicious lymph nodes. Esophagus within normal limits.  Lungs/Pleura: No pleural effusion or pneumothorax. Bandlike scarring in the lower lobes. Mild consolidation and ground-glass disease in the posterior right upper lobe. Musculoskeletal: No acute osseous abnormality. CT ABDOMEN PELVIS FINDINGS Hepatobiliary: Hepatic steatosis. Cholecystectomy. No biliary dilatation Pancreas: Unremarkable. No pancreatic ductal dilatation or surrounding inflammatory changes. Spleen: Normal in size without focal abnormality. Adrenals/Urinary Tract: Normal adrenal glands. Slightly enlarged extrarenal pelvises without hydroureter. No obstructing stone. Moderate urinary bladder distension Stomach/Bowel: The stomach is nonenlarged. No dilated small bowel. No acute bowel wall thickening. Diverticular disease of the left colon. Vascular/Lymphatic: Moderate aortic atherosclerosis. No aneurysm. No suspicious lymph nodes. Reproductive: Hysterectomy.  No adnexal mass Other: Negative for ascites or free air Musculoskeletal: Postsurgical changes of the left breast. Chronic soft tissue calcifications overlying left hip. No acute osseous abnormality. IMPRESSION: 1. Mild consolidation and ground-glass disease in the posterior right upper lobe, possible pneumonia or aspiration. 2. No CT evidence for acute intra-abdominal or pelvic abnormality. 3. Hepatic steatosis. 4. Diverticular disease of the left colon without acute inflammatory process. 5. Aortic atherosclerosis. Aortic Atherosclerosis (ICD10-I70.0). Electronically Signed   By: Luke Bun M.D.   On: 12/25/2023 21:07   DG Chest Port 1 View Result Date: 12/25/2023 EXAM: 1 VIEW(S) XRAY OF THE CHEST 12/25/2023 06:45:00 PM COMPARISON: 08/22/2023 CLINICAL HISTORY: Questionable sepsis - evaluate for abnormality FINDINGS: LINES, TUBES AND DEVICES: Right PICC removed. LUNGS AND PLEURA: Low lung volume. No focal pulmonary opacity. No pulmonary edema. No pleural effusion. No pneumothorax. HEART AND MEDIASTINUM: Stable mild cardiomegaly. BONES AND SOFT  TISSUES: Left chest wall surgical clips. Right shoulder arthroplasty noted. IMPRESSION: 1. No acute cardiopulmonary process. 2. Stable mild cardiomegaly. Electronically signed by: Luke Bun MD 12/25/2023 07:07 PM EST RP Workstation: [id number]        Scheduled Meds:  allopurinol   100 mg Oral Q supper   aspirin   300 mg Rectal Once   atorvastatin   80 mg Oral QPM   budesonide -glycopyrrolate -formoterol   2 puff Inhalation BID   famotidine   20 mg Oral QHS   folic acid   1 mg Oral Daily   gabapentin   300 mg Oral QHS   insulin  aspart  0-6 Units Subcutaneous TID WC   magnesium  oxide  400 mg Oral QHS   montelukast   10 mg Oral QPC supper   pantoprazole   40 mg Oral Daily   potassium chloride   10 mEq Oral Daily   predniSONE   5 mg Oral Q breakfast   prenatal multivitamin  1 tablet Oral Q1200   rivaroxaban   20 mg Oral Q supper   Continuous Infusions:  azithromycin  500 mg (12/26/23 0127)   cefTRIAXone  (ROCEPHIN )  IV 2 g (12/26/23 0601)   lactated ringers  40 mL/hr at 12/26/23 1000  LOS: 0 days    Time spent: 35 Minutes    Howard Bunte A Ayra Hodgdon, MD [facility]   If 7PM-7AM, please contact night-coverage [email or web address]  12/26/2023, 11:36 AM

## 2023-12-26 NOTE — Plan of Care (Signed)
  Problem: Clinical Measurements: Goal: Ability to maintain clinical measurements within normal limits will improve Outcome: Progressing Goal: Will remain free from infection Outcome: Progressing Goal: Respiratory complications will improve Outcome: Progressing   Problem: Pain Managment: Goal: General experience of comfort will improve and/or be controlled Outcome: Progressing   Problem: Metabolic: Goal: Ability to maintain appropriate glucose levels will improve Outcome: Progressing

## 2023-12-27 ENCOUNTER — Other Ambulatory Visit: Payer: Self-pay

## 2023-12-27 DIAGNOSIS — A419 Sepsis, unspecified organism: Secondary | ICD-10-CM | POA: Diagnosis not present

## 2023-12-27 LAB — BASIC METABOLIC PANEL WITH GFR
Anion gap: 8 (ref 5–15)
BUN: 23 mg/dL (ref 8–23)
CO2: 25 mmol/L (ref 22–32)
Calcium: 9 mg/dL (ref 8.9–10.3)
Chloride: 110 mmol/L (ref 98–111)
Creatinine, Ser: 1.36 mg/dL — ABNORMAL HIGH (ref 0.44–1.00)
GFR, Estimated: 40 mL/min — ABNORMAL LOW
Glucose, Bld: 160 mg/dL — ABNORMAL HIGH (ref 70–99)
Potassium: 4.3 mmol/L (ref 3.5–5.1)
Sodium: 143 mmol/L (ref 135–145)

## 2023-12-27 LAB — GLUCOSE, CAPILLARY
Glucose-Capillary: 129 mg/dL — ABNORMAL HIGH (ref 70–99)
Glucose-Capillary: 255 mg/dL — ABNORMAL HIGH (ref 70–99)
Glucose-Capillary: 273 mg/dL — ABNORMAL HIGH (ref 70–99)
Glucose-Capillary: 239 mg/dL — ABNORMAL HIGH (ref 70–99)
Glucose-Capillary: 184 mg/dL — ABNORMAL HIGH (ref 70–99)

## 2023-12-27 LAB — CBC
HCT: 30.3 % — ABNORMAL LOW (ref 36.0–46.0)
Hemoglobin: 9.3 g/dL — ABNORMAL LOW (ref 12.0–15.0)
MCH: 33.8 pg (ref 26.0–34.0)
MCHC: 30.7 g/dL (ref 30.0–36.0)
MCV: 110.2 fL — ABNORMAL HIGH (ref 80.0–100.0)
Platelets: 109 K/uL — ABNORMAL LOW (ref 150–400)
RBC: 2.75 MIL/uL — ABNORMAL LOW (ref 3.87–5.11)
RDW: 20 % — ABNORMAL HIGH (ref 11.5–15.5)
WBC: 10.6 K/uL — ABNORMAL HIGH (ref 4.0–10.5)
nRBC: 0.2 % (ref 0.0–0.2)

## 2023-12-27 MED ORDER — TRAZODONE HCL 50 MG PO TABS
50.0000 mg | ORAL_TABLET | Freq: Every evening | ORAL | Status: DC | PRN
Start: 2023-12-27 — End: 2023-12-29
  Administered 2023-12-28 (×2): 50 mg via ORAL
  Filled 2023-12-27 (×2): qty 1

## 2023-12-27 MED ORDER — LORATADINE 10 MG PO TABS
10.0000 mg | ORAL_TABLET | Freq: Every day | ORAL | Status: DC
  Administered 2023-12-27 – 2023-12-28 (×2): 10 mg via ORAL
  Filled 2023-12-27 (×2): qty 1

## 2023-12-27 MED ORDER — ALBUTEROL SULFATE (2.5 MG/3ML) 0.083% IN NEBU
2.5000 mg | INHALATION_SOLUTION | Freq: Two times a day (BID) | RESPIRATORY_TRACT | Status: DC
  Administered 2023-12-27 – 2023-12-29 (×5): 2.5 mg via RESPIRATORY_TRACT
  Filled 2023-12-27 (×5): qty 3

## 2023-12-27 MED ORDER — SACUBITRIL-VALSARTAN 24-26 MG PO TABS
1.0000 | ORAL_TABLET | Freq: Two times a day (BID) | ORAL | Status: DC
  Administered 2023-12-27 – 2023-12-29 (×5): 1 via ORAL
  Filled 2023-12-27 (×5): qty 1

## 2023-12-27 MED ORDER — GUAIFENESIN ER 600 MG PO TB12
1200.0000 mg | ORAL_TABLET | Freq: Two times a day (BID) | ORAL | Status: DC
  Administered 2023-12-27 – 2023-12-29 (×5): 1200 mg via ORAL
  Filled 2023-12-27 (×5): qty 2

## 2023-12-27 NOTE — Progress Notes (Signed)
 PROGRESS NOTE    Ebony Scott  FMW:[id number] DOB: Oct 03, 1950 DOA: 12/25/2023 PCP: [name], MD   Brief Narrative: 73 year old with past medical history significant for CAD status post PCI, ischemic cardiomyopathy, diabetes type 2, sleep apnea, history of DVT, hypogammaglobulinemia, rheumatoid arthritis recently treated this year for Enterococcus faecalis bacteremia and ICD was removed at that time, brought by family due to altered mental status, patient noted to be more lethargic.    Evaluation in the ED she was found to be febrile, temperature 103, hypotensive blood pressure in the 90s.  Lactic acid  elevated at 2.3. CT chest abdomen and pelvis showed features concerning for pneumonia, aspiration.  COVID and flu test negative.  Of note patient received the Covid vaccine  the day prior to admission.   Assessment & Plan:   Principal Problem:   Sepsis (HCC) Active Problems:   Hypogammaglobulinemia   Chronic kidney disease, stage 3b (HCC) - baseline Scr 1.3-1.5   Rheumatoid arthritis (HCC)   History of pulmonary embolus (PE)   DMII (diabetes mellitus, type 2) (HCC)   DNR (do not resuscitate)   Nocturnal hypoxia - 2-3 L/min   Hyperthyroidism   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HCC)    CAP (community acquired pneumonia)   1-Sepsis from Aspiration Pneumonia: -Patient presented with altered mental status, temperature 102, hypotensive, elevated lactic acid  at 2.3.  tachypnea.  -Daughter reported that patient probably vomited at home. - CT chest: Mild consolidation and ground glass disease in the posterior right upper lobe, possible pneumonia or aspiration. - Continue IV ceftriaxone  and azithromycin  -Blood culture: no growth to date.  -Discontinue IV fluids. Afebrile.  Home tomorrow if remain stable.   2-Ischemic cardiomyopathy status post extraction of ICD in July 2025 patient had Enterococcus faecalis bacteremia.  Last ejection fraction was June 2025 which showed ejection  fraction 25 to 30%. - Patient was hypotensive and septic  Entresto  and Jardiance  held -will resume Entresto .   Acute metabolic encephalopathy: In the setting of fever, sepsis and aspiration pneumonia - Patient gets very lethargic when she is spiking fever. - She is alert and conversant, sitting in recliner today.  Following commands.  She is feeling much better.  CAD status post ICD removal: Continue statin, aspirin  and Xarelto   Rheumatoid arthritis on chronic prednisone .  Received  stress dose of steroid, times 2 doses ,  -Continue with prednisone .   Chronic kidney disease stage IIIb: Monitor creatinine Cr Range 1.4---1.2 Stable.   Anemia likely of renal disease. Monitor hemoglobin  GERD: Continue with PPI  Diabetes type 2: Sliding scale insulin  last hemoglobin A1c 6.5  Hypogammaglobinemia ;gets IVIG infusion  History of PE: Continue with Xarelto   Chronic pain: Continue with gabapentin  Thrombocytopenia: Suspect in the setting of infectious process.  Monitor    Estimated body mass index is 24.14 kg/m as calculated from the following:   Height as of 12/05/23: 5' (1.524 m).   Weight as of 12/05/23: 56.1 kg.   DVT prophylaxis: Xarelto  Code Status: DNR Family Communication: Daughter over the phone while on rounds Disposition Plan:  Status is: Observation The patient remains OBS appropriate and will d/c before 2 midnights.    Consultants:  none  Procedures:  none  Antimicrobials:    Subjective: Patient is alert and conversant today, she is sitting in the recliner.  She denies worsening dyspnea.  Reports mild cough.  Objective: Vitals:   12/26/23 1830 12/26/23 1932 12/26/23 2143 12/26/23 2218  BP: 130/64  124/60   Pulse: 76  77  74  Resp: 20  18 17   Temp: 97.9 F (36.6 C)  98.2 F (36.8 C)   TempSrc: Oral  Oral   SpO2: 94% 94% 97% 96%    Intake/Output Summary (Last 24 hours) at 12/27/2023 0804 Last data filed at 12/27/2023 0600 Gross per 24 hour   Intake 1383.01 ml  Output 325 ml  Net 1058.01 ml   There were no vitals filed for this visit.  Examination:  General exam: NAD Respiratory system:, Bilateral crackles at the bases Cardiovascular system: S1-S2 regular rhythm and rate Gastrointestinal system: Bowel sounds present, soft nontender non distended Central nervous system; alert and conversant, following commands Extremities: No edema     Data Reviewed: I have personally reviewed following labs and imaging studies  CBC: Recent Labs  Lab 12/25/23 1901 12/26/23 0552 12/27/23 0526  WBC 8.1 10.4 10.6*  NEUTROABS 5.9  --   --   HGB 10.2* 10.1* 9.3*  HCT 32.9* 32.7* 30.3*  MCV 108.6* 108.6* 110.2*  PLT 115* 125* 109*   Basic Metabolic Panel: Recent Labs  Lab 12/25/23 1901 12/26/23 0552 12/27/23 0526  NA 138 138 143  K 4.2 4.6 4.3  CL 106 106 110  CO2 22 24 25   GLUCOSE 117* 82 160*  BUN 24* 19 23  CREATININE 1.25* 1.20* 1.36*  CALCIUM  9.0 9.3 9.0   GFR: CrCl cannot be calculated (Unknown ideal weight.). Liver Function Tests: Recent Labs  Lab 12/25/23 1901  AST 44*  ALT 40  ALKPHOS 51  BILITOT 0.7  PROT 6.1*  ALBUMIN 3.1*   No results for input(s): LIPASE, AMYLASE in the last 168 hours. No results for input(s): AMMONIA in the last 168 hours. Coagulation Profile: Recent Labs  Lab 12/25/23 1901  INR 1.1   Cardiac Enzymes: No results for input(s): CKTOTAL, CKMB, CKMBINDEX, TROPONINI in the last 168 hours. BNP (last 3 results) No results for input(s): PROBNP in the last 8760 hours. HbA1C: No results for input(s): HGBA1C in the last 72 hours. CBG: Recent Labs  Lab 12/26/23 0801 12/26/23 1129 12/26/23 1623 12/26/23 2140 12/27/23 0714  GLUCAP 88 147* 146* 252* 129*   Lipid Profile: No results for input(s): CHOL, HDL, LDLCALC, TRIG, CHOLHDL, LDLDIRECT in the last 72 hours. Thyroid  Function Tests: No results for input(s): TSH, T4TOTAL, FREET4,  T3FREE, THYROIDAB in the last 72 hours. Anemia Panel: No results for input(s): VITAMINB12, FOLATE, FERRITIN, TIBC, IRON , RETICCTPCT in the last 72 hours. Sepsis Labs: Recent Labs  Lab 12/25/23 1726 12/25/23 1901 12/25/23 2126  LATICACIDVEN 2.1* 2.3* 1.8    Recent Results (from the past 240 hours)  Blood Culture (routine x 2)     Status: None (Preliminary result)   Collection Time: 12/25/23  5:18 PM   Specimen: BLOOD  Result Value Ref Range Status   Specimen Description   Final    BLOOD LEFT ANTECUBITAL Performed at [facility], [address] [place], [place] [address]    Special Requests   Final    BOTTLES DRAWN AEROBIC AND ANAEROBIC Blood Culture adequate volume Performed at [facility], [address], [place], [place] [address]    Culture  Setup Time NO ORGANISMS SEEN AEROBIC BOTTLE ONLY   Final   Culture   Final    NO GROWTH 2 DAYS Performed at [facility], [address], [place], [place] [address]    Report Status PENDING  Incomplete  Blood Culture (routine x 2)     Status: None (  Preliminary result)   Collection Time: 12/25/23  5:32 PM   Specimen: BLOOD  Result Value Ref Range Status   Specimen Description   Final    BLOOD BLOOD RIGHT ARM Performed at [facility], [address] [place], [place] [address]    Special Requests   Final    BOTTLES DRAWN AEROBIC AND ANAEROBIC Blood Culture adequate volume Performed at [facility], [address], [place], [place] [address]    Culture   Final    NO GROWTH 2 DAYS Performed at [facility], [address], [place], [place] [address]    Report Status PENDING  Incomplete  Resp panel by RT-PCR (RSV, Flu A&B, Covid)     Status: None   Collection Time: 12/25/23  9:48 PM   Specimen: Nasal Swab  Result Value Ref Range Status   SARS Coronavirus 2 by RT PCR NEGATIVE NEGATIVE Final    Comment: (NOTE) SARS-CoV-2  target nucleic acids are NOT DETECTED.  The SARS-CoV-2 RNA is generally detectable in upper respiratory specimens during the acute phase of infection. The lowest concentration of SARS-CoV-2 viral copies this assay can detect is 138 copies/mL. A negative result does not preclude SARS-Cov-2 infection and should not be used as the sole basis for treatment or other patient management decisions. A negative result may occur with  improper specimen collection/handling, submission of specimen other than nasopharyngeal swab, presence of viral mutation(s) within the areas targeted by this assay, and inadequate number of viral copies(<138 copies/mL). A negative result must be combined with clinical observations, patient history, and epidemiological information. The expected result is Negative.  Fact Sheet for Patients:  bloggercourse.com  Fact Sheet for Healthcare Providers:  seriousbroker.it  This test is no t yet approved or cleared by the [place]  FDA and  has been authorized for detection and/or diagnosis of SARS-CoV-2 by FDA under an Emergency Use Authorization (EUA). This EUA will remain  in effect (meaning this test can be used) for the duration of the COVID-19 declaration under Section 564(b)(1) of the Act, 21 U.S.C.section 360bbb-3(b)(1), unless the authorization is terminated  or revoked sooner.       Influenza A by PCR NEGATIVE NEGATIVE Final   Influenza B by PCR NEGATIVE NEGATIVE Final    Comment: (NOTE) The Xpert Xpress SARS-CoV-2/FLU/RSV plus assay is intended as an aid in the diagnosis of influenza from Nasopharyngeal swab specimens and should not be used as a sole basis for treatment. Nasal washings and aspirates are unacceptable for Xpert Xpress SARS-CoV-2/FLU/RSV testing.  Fact Sheet for Patients: bloggercourse.com  Fact Sheet for Healthcare  Providers: seriousbroker.it  This test is not yet approved or cleared by the [place]  FDA and has been authorized for detection and/or diagnosis of SARS-CoV-2 by FDA under an Emergency Use Authorization (EUA). This EUA will remain in effect (meaning this test can be used) for the duration of the COVID-19 declaration under Section 564(b)(1) of the Act, 21 U.S.C. section 360bbb-3(b)(1), unless the authorization is terminated or revoked.     Resp Syncytial Virus by PCR NEGATIVE NEGATIVE Final    Comment: (NOTE) Fact Sheet for Patients: bloggercourse.com  Fact Sheet for Healthcare Providers: seriousbroker.it  This test is not yet approved or cleared by the [place]  FDA and has been authorized for detection and/or diagnosis of SARS-CoV-2 by FDA under an Emergency Use Authorization (EUA). This EUA will remain in effect (meaning this test can be used) for the duration of the COVID-19 declaration  under Section 564(b)(1) of the Act, 21 U.S.C. section 360bbb-3(b)(1), unless the authorization is terminated or revoked.  Performed at [facility], [address] [place], [place] [address]          Radiology Studies: CT CHEST ABDOMEN PELVIS W CONTRAST Result Date: 12/25/2023 CLINICAL DATA:  Sepsis lethargy and vomiting EXAM: CT CHEST, ABDOMEN, AND PELVIS WITH CONTRAST TECHNIQUE: Multidetector CT imaging of the chest, abdomen and pelvis was performed following the standard protocol during bolus administration of intravenous contrast. RADIATION DOSE REDUCTION: This exam was performed according to the departmental dose-optimization program which includes automated exposure control, adjustment of the mA and/or kV according to patient size and/or use of iterative reconstruction technique. CONTRAST:  80mL OMNIPAQUE  IOHEXOL  300 MG/ML  SOLN COMPARISON:  Chest x-ray 12/25/2023, CT 07/31/2023,  03/29/2023 FINDINGS: CT CHEST FINDINGS Cardiovascular: Moderate aortic atherosclerosis. No aneurysm. Multi vessel coronary vascular calcification. Normal cardiac size. No pericardial effusion Mediastinum/Nodes: Patent trachea. No thyroid  mass. No suspicious lymph nodes. Esophagus within normal limits. Lungs/Pleura: No pleural effusion or pneumothorax. Bandlike scarring in the lower lobes. Mild consolidation and ground-glass disease in the posterior right upper lobe. Musculoskeletal: No acute osseous abnormality. CT ABDOMEN PELVIS FINDINGS Hepatobiliary: Hepatic steatosis. Cholecystectomy. No biliary dilatation Pancreas: Unremarkable. No pancreatic ductal dilatation or surrounding inflammatory changes. Spleen: Normal in size without focal abnormality. Adrenals/Urinary Tract: Normal adrenal glands. Slightly enlarged extrarenal pelvises without hydroureter. No obstructing stone. Moderate urinary bladder distension Stomach/Bowel: The stomach is nonenlarged. No dilated small bowel. No acute bowel wall thickening. Diverticular disease of the left colon. Vascular/Lymphatic: Moderate aortic atherosclerosis. No aneurysm. No suspicious lymph nodes. Reproductive: Hysterectomy.  No adnexal mass Other: Negative for ascites or free air Musculoskeletal: Postsurgical changes of the left breast. Chronic soft tissue calcifications overlying left hip. No acute osseous abnormality. IMPRESSION: 1. Mild consolidation and ground-glass disease in the posterior right upper lobe, possible pneumonia or aspiration. 2. No CT evidence for acute intra-abdominal or pelvic abnormality. 3. Hepatic steatosis. 4. Diverticular disease of the left colon without acute inflammatory process. 5. Aortic atherosclerosis. Aortic Atherosclerosis (ICD10-I70.0). Electronically Signed   By: Luke Bun M.D.   On: 12/25/2023 21:07   DG Chest Port 1 View Result Date: 12/25/2023 EXAM: 1 VIEW(S) XRAY OF THE CHEST 12/25/2023 06:45:00 PM COMPARISON: 08/22/2023  CLINICAL HISTORY: Questionable sepsis - evaluate for abnormality FINDINGS: LINES, TUBES AND DEVICES: Right PICC removed. LUNGS AND PLEURA: Low lung volume. No focal pulmonary opacity. No pulmonary edema. No pleural effusion. No pneumothorax. HEART AND MEDIASTINUM: Stable mild cardiomegaly. BONES AND SOFT TISSUES: Left chest wall surgical clips. Right shoulder arthroplasty noted. IMPRESSION: 1. No acute cardiopulmonary process. 2. Stable mild cardiomegaly. Electronically signed by: Luke Bun MD 12/25/2023 07:07 PM EST RP Workstation: [id number]        Scheduled Meds:  allopurinol   100 mg Oral Q supper   aspirin   300 mg Rectal Once   atorvastatin   80 mg Oral QPM   budesonide -glycopyrrolate -formoterol   2 puff Inhalation BID   famotidine   20 mg Oral QHS   folic acid   1 mg Oral Daily   gabapentin   300 mg Oral QHS   hydrocortisone  sod succinate (SOLU-CORTEF ) inj  50 mg Intravenous BID   insulin  aspart  0-6 Units Subcutaneous TID WC   magnesium  oxide  400 mg Oral QHS   montelukast   10 mg Oral QPC supper   pantoprazole   40 mg Oral Daily   potassium chloride   10 mEq Oral Daily   predniSONE   5 mg Oral  Q breakfast   prenatal multivitamin  1 tablet Oral Q1200   rivaroxaban   20 mg Oral Q supper   Continuous Infusions:  azithromycin  500 mg (12/27/23 0017)   cefTRIAXone  (ROCEPHIN )  IV 2 g (12/27/23 0518)   lactated ringers  40 mL/hr at 12/26/23 1000     LOS: 1 day    Time spent: 35 Minutes    Patrena Santalucia A Donaldo Teegarden, MD [facility]   If 7PM-7AM, please contact night-coverage [email or web address]  12/27/2023, 8:04 AM

## 2023-12-27 NOTE — Progress Notes (Signed)
   12/27/23 2312  BiPAP/CPAP/SIPAP  BiPAP/CPAP/SIPAP Pt Type Adult  Reason BIPAP/CPAP not in use Other(comment) (Holding off on CPAP, pt constantly coughing and speaking up mucus)

## 2023-12-27 NOTE — Plan of Care (Signed)
  Problem: Clinical Measurements: Goal: Ability to maintain clinical measurements within normal limits will improve Outcome: Progressing Goal: Diagnostic test results will improve Outcome: Progressing Goal: Respiratory complications will improve Outcome: Progressing Goal: Cardiovascular complication will be avoided Outcome: Progressing   Problem: Safety: Goal: Ability to remain free from injury will improve Outcome: Progressing   Problem: Metabolic: Goal: Ability to maintain appropriate glucose levels will improve Outcome: Progressing

## 2023-12-27 NOTE — Plan of Care (Signed)
  Problem: Education: Goal: Knowledge of General Education information will improve Description: Including pain rating scale, medication(s)/side effects and non-pharmacologic comfort measures Outcome: Progressing   Problem: Clinical Measurements: Goal: Ability to maintain clinical measurements within normal limits will improve Outcome: Progressing Goal: Will remain free from infection Outcome: Progressing Goal: Diagnostic test results will improve Outcome: Progressing Goal: Respiratory complications will improve Outcome: Progressing   Problem: Nutrition: Goal: Adequate nutrition will be maintained Outcome: Progressing   Problem: Safety: Goal: Ability to remain free from injury will improve Outcome: Progressing   Problem: Metabolic: Goal: Ability to maintain appropriate glucose levels will improve Outcome: Progressing

## 2023-12-28 DIAGNOSIS — J189 Pneumonia, unspecified organism: Secondary | ICD-10-CM | POA: Diagnosis not present

## 2023-12-28 DIAGNOSIS — B3731 Acute candidiasis of vulva and vagina: Secondary | ICD-10-CM | POA: Diagnosis not present

## 2023-12-28 DIAGNOSIS — I2510 Atherosclerotic heart disease of native coronary artery without angina pectoris: Secondary | ICD-10-CM

## 2023-12-28 DIAGNOSIS — A419 Sepsis, unspecified organism: Secondary | ICD-10-CM | POA: Diagnosis not present

## 2023-12-28 LAB — GLUCOSE, CAPILLARY
Glucose-Capillary: 94 mg/dL (ref 70–99)
Glucose-Capillary: 141 mg/dL — ABNORMAL HIGH (ref 70–99)
Glucose-Capillary: 144 mg/dL — ABNORMAL HIGH (ref 70–99)
Glucose-Capillary: 102 mg/dL — ABNORMAL HIGH (ref 70–99)

## 2023-12-28 MED ORDER — TRAMADOL HCL 50 MG PO TABS
25.0000 mg | ORAL_TABLET | Freq: Once | ORAL | Status: AC
  Administered 2023-12-28: 25 mg via ORAL
  Filled 2023-12-28: qty 1

## 2023-12-28 MED ORDER — SACCHAROMYCES BOULARDII 250 MG PO CAPS
250.0000 mg | ORAL_CAPSULE | Freq: Two times a day (BID) | ORAL | Status: DC
  Administered 2023-12-28 – 2023-12-29 (×2): 250 mg via ORAL
  Filled 2023-12-28 (×2): qty 1

## 2023-12-28 MED ORDER — FENTANYL CITRATE (PF) 50 MCG/ML IJ SOSY: 12.5000 ug | PREFILLED_SYRINGE | Freq: Once | INTRAMUSCULAR | Status: DC

## 2023-12-28 MED ORDER — BENZONATATE 100 MG PO CAPS
100.0000 mg | ORAL_CAPSULE | Freq: Three times a day (TID) | ORAL | Status: DC | PRN
  Administered 2023-12-28: 100 mg via ORAL
  Filled 2023-12-28: qty 1

## 2023-12-28 MED ORDER — IPRATROPIUM-ALBUTEROL 0.5-2.5 (3) MG/3ML IN SOLN: 3.0000 mL | Freq: Four times a day (QID) | RESPIRATORY_TRACT | Status: DC | PRN

## 2023-12-28 MED ORDER — FLUCONAZOLE 150 MG PO TABS
150.0000 mg | ORAL_TABLET | Freq: Once | ORAL | Status: AC
  Administered 2023-12-28: 150 mg via ORAL
  Filled 2023-12-28: qty 1

## 2023-12-28 MED ORDER — FLUCONAZOLE 100 MG PO TABS
100.0000 mg | ORAL_TABLET | Freq: Once | ORAL | Status: DC
  Filled 2023-12-28: qty 1

## 2023-12-28 MED ORDER — ALIGN 4 MG PO CAPS: 4.0000 mg | ORAL_CAPSULE | Freq: Every evening | ORAL | Status: DC

## 2023-12-28 MED ORDER — LIDOCAINE 5 % EX PTCH
1.0000 | MEDICATED_PATCH | Freq: Once | CUTANEOUS | Status: AC
  Administered 2023-12-28: 1 via TRANSDERMAL
  Filled 2023-12-28: qty 1

## 2023-12-28 MED ORDER — CLOTRIMAZOLE 1 % EX CREA: TOPICAL_CREAM | Freq: Two times a day (BID) | CUTANEOUS | Status: DC

## 2023-12-28 MED ORDER — CLOTRIMAZOLE 1 % VA CREA
1.0000 | TOPICAL_CREAM | Freq: Every day | VAGINAL | Status: DC
Start: 2023-12-28 — End: 2024-01-04
  Administered 2023-12-28: 1 via VAGINAL
  Filled 2023-12-28: qty 45

## 2023-12-28 NOTE — Assessment & Plan Note (Signed)
-  Appears to be stable at this time -Attempt to avoid nephrotoxic medications

## 2023-12-28 NOTE — Assessment & Plan Note (Deleted)
 DNR confirmed at the time of admission Patient will need a gold out of facility DNR form at the time of discharge

## 2023-12-28 NOTE — Assessment & Plan Note (Signed)
 SIRS criteria included fever to 102, tachypnea with evidence of acute organ failure including hypotension, elevated lactic acid , and acute encephalopathy Daughter reported that patient probably vomited at home CT chest: Mild consolidation and ground glass disease in the posterior right upper lobe, possible pneumonia or aspiration Continue IV ceftriaxone  and azithromycin  Blood culture: no growth to date Appears to be stabilizing Still with generalized weakness PT/OT consulted

## 2023-12-28 NOTE — Assessment & Plan Note (Deleted)
 Patient presented with altered mental status, temperature 102, hypotensive, elevated lactic acid  at 2.3.  tachypnea.  -Daughter reported that patient probably vomited at home. - CT chest: Mild consolidation and ground glass disease in the posterior right upper lobe, possible pneumonia or aspiration. - Continue IV ceftriaxone  and azithromycin  -Blood culture: no growth to date.  -Discontinue IV fluids. Afebrile.  Home tomorrow if remain stable.

## 2023-12-28 NOTE — Assessment & Plan Note (Deleted)
 On chronic prednisone  Received  stress dose of steroid, times 2 doses ,  -Continue with prednisone .

## 2023-12-28 NOTE — Assessment & Plan Note (Deleted)
 Continue Xarelto

## 2023-12-28 NOTE — Assessment & Plan Note (Deleted)
-  Appears to be stable at this time -Attempt to avoid nephrotoxic medications

## 2023-12-28 NOTE — Assessment & Plan Note (Deleted)
 Gets IVIG infusions

## 2023-12-28 NOTE — Progress Notes (Addendum)
 Progress Note   Patient: Ebony Scott FMW:[id number] DOB: February 11, 1951 DOA: 12/25/2023     2 DOS: the patient was seen and examined on 12/28/2023   Brief hospital course: 73yo with h/o CAD s/p PCI, ischemic cardiomyopathy, T2DM, OSA, DVT, hypogammaglobulinemia, recent Enterococcus faecalis bacteremia s/p AICD removal, and RA who presented on 11/5 with AMS.  She had sepsis physiology on presentation with lactate 2.3, CT c/w aspiration PNA.   Treated with Ceftriaxone  and Azithromycin .   Assessment & Plan Sepsis due to pneumonia (HCC) Aspiration pneumonia (HCC) SIRS criteria included fever to 102, tachypnea with evidence of acute organ failure including hypotension, elevated lactic acid , and acute encephalopathy Daughter reported that patient probably vomited at home CT chest: Mild consolidation and ground glass disease in the posterior right upper lobe, possible pneumonia or aspiration Continue IV ceftriaxone  and azithromycin  Blood culture: no growth to date Appears to be stabilizing Still with generalized weakness PT/OT consulted Acute metabolic encephalopathy In the setting of fever, sepsis and aspiration pneumonia This appears to have resolved with treatment of the infection Vaginal candidiasis From antibiotics Will give Diflucan  100 mg PO x 1 and topical clotrimazole  Will add probiotics 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HCC)  CAD S/P percutaneous coronary angioplasty S/p extraction of ICD in July 2025 due to Enterococcus faecalis bacteremia Last ejection fraction was June 2025 which showed ejection fraction 25 to 30% Entresto  and Jardiance  held on presentation; Entresto  has since been resumed Continue statin, aspirin  and Xarelto   Hypogammaglobulinemia Gets IVIG infusions  She was developing PNA and sepsis with standard IVIG and so was transitioned to hyperimmune IVIG with good result until now Recommended outpatient f/u with Dr. 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 failure (HCC) Appears to be stable at this time Attempt to avoid nephrotoxic medications Rheumatoid arthritis (HCC) On chronic prednisone  Received  stress dosed steroids x 2 doses Continue prednisone  History of pulmonary embolus (PE) Continue Xarelto   DMII (diabetes mellitus, type 2) (HCC) A1c 6.5, well controlled Cover with moderate-scale SSI Carb modified diet  Reports neuropathy, needs a refill on gabapentin  because she has not been taking this regularly Nocturnal hypoxia - 2-3 L/min Continue oxygen  supplementation  Also has CPAP at bedtime  Continue Trelegy, Duonebs, Singulair , albuterol  Chronic pain syndrome Continue gabapentin  DNR (do not resuscitate) DNR confirmed at the time of admission Patient will need a gold out of facility DNR form at the time of discharge      Consultants: PT OT RT  Procedures: None  Antibiotics: Azithromycin  11/6- Cefepime  x 1 Ceftriaxone  11/6- Vancomycin  x 1  30 Day Unplanned Readmission Risk Score    Flowsheet Row ED to Hosp-Admission (Current) from 12/25/2023 in [facility]  30 Day Unplanned Readmission Risk Score (%) 31.25 Filed at 12/28/2023 1200    This score is the patient's risk of an unplanned readmission within 30 days of being discharged (0 -100%). The score is based on dignosis, age, lab data, medications, orders, and past utilization.   Low:  0-14.9   Medium: 15-21.9   High: 22-29.9   Extreme: 30 and above           Subjective: Feeling better.  Having yeast infection symptoms, requests treatment.  Has not been up with therapy.  Requests 1 more day of hospitalization.   Objective: Vitals:   12/28/23 0815 12/28/23 1327  BP:  134/61  Pulse:  64  Resp:  12  Temp:  98.8 F (37.1 C)  SpO2: 100% 94%    Intake/Output Summary (Last 24 hours) at 12/28/2023 1619 Last data filed at 12/27/2023  1700 Gross per 24 hour  Intake --  Output 500 ml  Net -500 ml   There were no vitals filed for this visit.  Exam:  General:  Appears calm and comfortable and is in NAD, on RA Eyes:  normal lids, iris ENT:  grossly normal hearing, lips & tongue, mmm Cardiovascular:  RRR. No LE edema.  Respiratory:   CTA bilaterally with no wheezes/rales/rhonchi.  Normal respiratory effort. Abdomen:  soft, NT, ND Skin:  no rash or induration seen on limited exam Musculoskeletal:  grossly normal tone BUE/BLE, good ROM, no bony abnormality Psychiatric:  grossly normal mood and affect, speech fluent and appropriate, AOx3 Neurologic:  CN 2-12 grossly intact, moves all extremities in coordinated fashion  Data Reviewed: I have reviewed the patient's lab results since admission.  Pertinent labs for today include:   None today     Family Communication: None present  Mobility: PT/OT Consulted     Code Status: Limited: Do not attempt resuscitation (DNR) -DNR-LIMITED -Do Not Intubate/DNI    Disposition: Status is: Inpatient Remains inpatient appropriate because: ongoing management     Time spent: 50 minutes  Unresulted Labs (From admission, onward)     Start     Ordered   12/29/23 0500  CBC with Differential/Platelet  Tomorrow morning,   R        12/28/23 1619   12/29/23 0500  Basic metabolic panel with GFR  Tomorrow morning,   R        12/28/23 1619             Author: Delon Herald, MD 12/28/2023 4:19 PM  For on call review [email or web address].

## 2023-12-28 NOTE — Assessment & Plan Note (Signed)
 Continue gabapentin.

## 2023-12-28 NOTE — Assessment & Plan Note (Signed)
 Gets IVIG infusions  She was developing PNA and sepsis with standard IVIG and so was transitioned to hyperimmune IVIG with good result until now Recommended outpatient f/u with Dr. Maurilio post-hospitalization

## 2023-12-28 NOTE — Assessment & Plan Note (Signed)
 S/p extraction of ICD in July 2025 due to Enterococcus faecalis bacteremia Last ejection fraction was June 2025 which showed ejection fraction 25 to 30% Entresto  and Jardiance  held on presentation; Entresto  has since been resumed Continue statin, aspirin  and Xarelto 

## 2023-12-28 NOTE — Assessment & Plan Note (Deleted)
 status post extraction of ICD in July 2025 patient had Enterococcus faecalis bacteremia.  Last ejection fraction was June 2025 which showed ejection fraction 25 to 30%. - Patient was hypotensive and septic  Entresto  and Jardiance  held -will resume Entresto .  Continue statin, aspirin  and Xarelto 

## 2023-12-28 NOTE — Assessment & Plan Note (Deleted)
 In the setting of fever, sepsis and aspiration pneumonia - Patient gets very lethargic when she is spiking fever. - She is alert and conversant, sitting in recliner today.  Following commands.  She is feeling much better.

## 2023-12-28 NOTE — Hospital Course (Signed)
 73yo with h/o CAD s/p PCI, ischemic cardiomyopathy, T2DM, OSA, DVT, hypogammaglobulinemia, recent Enterococcus faecalis bacteremia s/p AICD removal, and RA who presented on 11/5 with AMS.  She had sepsis physiology on presentation with lactate 2.3, CT c/w aspiration PNA.   Treated with Ceftriaxone  and Azithromycin .

## 2023-12-28 NOTE — Assessment & Plan Note (Signed)
 From antibiotics Will give Diflucan  100 mg PO x 1 and topical clotrimazole  Will add probiotics

## 2023-12-28 NOTE — Assessment & Plan Note (Signed)
 Continue Xarelto

## 2023-12-28 NOTE — Assessment & Plan Note (Signed)
 Continue oxygen  supplementation  Also has CPAP at bedtime  Continue Trelegy, Duonebs, Singulair , albuterol 

## 2023-12-28 NOTE — Assessment & Plan Note (Deleted)
 Continue oxygen supplementation

## 2023-12-28 NOTE — Assessment & Plan Note (Signed)
 On chronic prednisone  Received  stress dosed steroids x 2 doses Continue prednisone 

## 2023-12-28 NOTE — Assessment & Plan Note (Deleted)
 A1c 6.5, well controlled Cover with moderate-scale SSI Carb modified diet

## 2023-12-28 NOTE — Assessment & Plan Note (Deleted)
 Continue gabapentin.

## 2023-12-28 NOTE — Assessment & Plan Note (Signed)
 In the setting of fever, sepsis and aspiration pneumonia This appears to have resolved with treatment of the infection

## 2023-12-28 NOTE — Progress Notes (Signed)
   12/28/23 2216  BiPAP/CPAP/SIPAP  BiPAP/CPAP/SIPAP Pt Type Adult  BiPAP/CPAP/SIPAP Resmed  Mask Type Full face mask  Dentures removed? Not applicable  Mask Size Small  Flow Rate 2 lpm  Patient Home Machine No  Patient Home Mask No  Patient Home Tubing No  Auto Titrate Yes  Minimum cmH2O 4 cmH2O  Maximum cmH2O 15 cmH2O  CPAP/SIPAP surface wiped down Yes  Device Plugged into RED Power Outlet Yes

## 2023-12-28 NOTE — Assessment & Plan Note (Signed)
 A1c 6.5, well controlled Cover with moderate-scale SSI Carb modified diet  Reports neuropathy, needs a refill on gabapentin  because she has not been taking this regularly

## 2023-12-28 NOTE — Assessment & Plan Note (Signed)
 DNR confirmed at the time of admission Patient will need a gold out of facility DNR form at the time of discharge

## 2023-12-29 ENCOUNTER — Other Ambulatory Visit (HOSPITAL_COMMUNITY): Payer: Self-pay

## 2023-12-29 DIAGNOSIS — J189 Pneumonia, unspecified organism: Secondary | ICD-10-CM | POA: Diagnosis not present

## 2023-12-29 DIAGNOSIS — A419 Sepsis, unspecified organism: Secondary | ICD-10-CM | POA: Diagnosis not present

## 2023-12-29 LAB — CBC WITH DIFFERENTIAL/PLATELET
Abs Immature Granulocytes: 0.04 K/uL (ref 0.00–0.07)
Basophils Absolute: 0 K/uL (ref 0.0–0.1)
Basophils Relative: 0 %
Eosinophils Absolute: 0.5 K/uL (ref 0.0–0.5)
Eosinophils Relative: 7 %
HCT: 28.8 % — ABNORMAL LOW (ref 36.0–46.0)
Hemoglobin: 8.9 g/dL — ABNORMAL LOW (ref 12.0–15.0)
Immature Granulocytes: 1 %
Lymphocytes Relative: 21 %
Lymphs Abs: 1.6 K/uL (ref 0.7–4.0)
MCH: 34.6 pg — ABNORMAL HIGH (ref 26.0–34.0)
MCHC: 30.9 g/dL (ref 30.0–36.0)
MCV: 112.1 fL — ABNORMAL HIGH (ref 80.0–100.0)
Monocytes Absolute: 1 K/uL (ref 0.1–1.0)
Monocytes Relative: 13 %
Neutro Abs: 4.1 K/uL (ref 1.7–7.7)
Neutrophils Relative %: 58 %
Platelets: 115 K/uL — ABNORMAL LOW (ref 150–400)
RBC: 2.57 MIL/uL — ABNORMAL LOW (ref 3.87–5.11)
RDW: 19.9 % — ABNORMAL HIGH (ref 11.5–15.5)
WBC: 7.2 K/uL (ref 4.0–10.5)
nRBC: 0 % (ref 0.0–0.2)

## 2023-12-29 LAB — BASIC METABOLIC PANEL WITH GFR
Anion gap: 7 (ref 5–15)
BUN: 25 mg/dL — ABNORMAL HIGH (ref 8–23)
CO2: 22 mmol/L (ref 22–32)
Calcium: 8.4 mg/dL — ABNORMAL LOW (ref 8.9–10.3)
Chloride: 111 mmol/L (ref 98–111)
Creatinine, Ser: 1.24 mg/dL — ABNORMAL HIGH (ref 0.44–1.00)
GFR, Estimated: 45 mL/min — ABNORMAL LOW (ref >60)
Glucose, Bld: 90 mg/dL (ref 70–99)
Potassium: 4.2 mmol/L (ref 3.5–5.1)
Sodium: 140 mmol/L (ref 135–145)

## 2023-12-29 LAB — GLUCOSE, CAPILLARY
Glucose-Capillary: 85 mg/dL (ref 70–99)
Glucose-Capillary: 107 mg/dL — ABNORMAL HIGH (ref 70–99)

## 2023-12-29 LAB — CULTURE, BLOOD (ROUTINE X 2)

## 2023-12-29 MED ORDER — AMOXICILLIN-POT CLAVULANATE 875-125 MG PO TABS
1.0000 | ORAL_TABLET | Freq: Two times a day (BID) | ORAL | 0 refills | Status: AC
  Filled 2023-12-29: qty 10, 5d supply, fill #0

## 2023-12-29 MED ORDER — PHENOL 1.4 % MT LIQD
1.0000 | OROMUCOSAL | Status: DC | PRN
  Administered 2023-12-29: 1 via OROMUCOSAL
  Filled 2023-12-29: qty 177

## 2023-12-29 MED ORDER — ONDANSETRON HCL 4 MG PO TABS
4.0000 mg | ORAL_TABLET | Freq: Every day | ORAL | 0 refills | Status: AC | PRN
  Filled 2023-12-29: qty 30, 30d supply, fill #0

## 2023-12-29 MED ORDER — GABAPENTIN 300 MG PO CAPS
300.0000 mg | ORAL_CAPSULE | Freq: Every day | ORAL | 0 refills | Status: DC
  Filled 2023-12-29: qty 30, 30d supply, fill #0

## 2023-12-29 MED ORDER — ACETAMINOPHEN 325 MG PO TABS
650.0000 mg | ORAL_TABLET | Freq: Once | ORAL | Status: AC
  Administered 2023-12-29: 650 mg via ORAL
  Filled 2023-12-29: qty 2

## 2023-12-29 MED ORDER — ONDANSETRON HCL 4 MG/2ML IJ SOLN
4.0000 mg | Freq: Four times a day (QID) | INTRAMUSCULAR | Status: DC | PRN
  Administered 2023-12-29: 4 mg via INTRAVENOUS
  Filled 2023-12-29: qty 2

## 2023-12-29 MED ORDER — TRAMADOL HCL 50 MG PO TABS
25.0000 mg | ORAL_TABLET | Freq: Once | ORAL | Status: AC
  Administered 2023-12-29: 25 mg via ORAL
  Filled 2023-12-29: qty 1

## 2023-12-29 NOTE — Assessment & Plan Note (Addendum)
 SIRS criteria included fever to 102, tachypnea with evidence of acute organ failure including hypotension, elevated lactic acid , and acute encephalopathy Daughter reported that patient probably vomited at home CT chest: Mild consolidation and ground glass disease in the posterior right upper lobe, possible pneumonia or aspiration Continue IV ceftriaxone  and azithromycin  Blood culture: no growth to date Appears to be stabilizing PT/OT consulted and she did very well, wants to go home today

## 2023-12-29 NOTE — Assessment & Plan Note (Signed)
 Gets IVIG infusions  She was developing PNA and sepsis with standard IVIG and so was transitioned to hyperimmune IVIG with good result until now Recommended outpatient f/u with Dr. Maurilio post-hospitalization

## 2023-12-29 NOTE — Evaluation (Signed)
 Physical Therapy Evaluation Patient Details Name: Ebony Scott MRN: [id number] DOB: 14-Aug-1950 Today's Date: 12/29/2023  History of Present Illness  Pt is a 73 y/o female presenting with AMS and lethargy s/p recent COVID vaccine. Fever+, sepsis due to aspiration PNA and acute metabolic encephalopathy. PMH: CAD s/p PCI,  ischemic cardiomyopathy, T2DM, OSA, DVT, hypogammaglobulinemia, recent Enterococcus faecalis bacteremia s/p AICD removal, CKD and RA.  Clinical Impression  Patient evaluated by Physical Therapy with no further acute PT needs identified. All education has been completed and the patient has no further questions.  Pt agreeable and motivated to work with PT; c/o a little nausea, just had meds. Pt amb 360' with RW and supervision-CGA. Steady gait, no LOB, denies significant fatigue or dyspnea.  Pt has all DME, no further PT needs at this time, pt is hopeful to d/c home soon  See below for any follow-up Physical Therapy or equipment needs. PT is signing off. Thank you for this referral.         If plan is discharge home, recommend the following: Help with stairs or ramp for entrance;Assist for transportation;Assistance with cooking/housework   Can travel by private vehicle        Equipment Recommendations None recommended by PT  Recommendations for Other Services       Functional Status Assessment Patient has not had a recent decline in their functional status     Precautions / Restrictions Precautions Precautions: Fall Restrictions Weight Bearing Restrictions Per Provider Order: No      Mobility  Bed Mobility               General bed mobility comments: NT pt in recliner and returned to same    Transfers Overall transfer level: Needs assistance Equipment used: Rolling walker (2 wheels) Transfers: Sit to/from Stand Sit to Stand: Supervision, Modified independent (Device/Increase time)                Ambulation/Gait Ambulation/Gait assistance:  Supervision Gait Distance (Feet): 360 Feet Assistive device: Rolling walker (2 wheels) Gait Pattern/deviations: Step-through pattern, Decreased stride length Gait velocity: decr     General Gait Details: pt able to navigate RW through obstacles in hallway. steady with RW support, no LOB  Stairs            Wheelchair Mobility     Tilt Bed    Modified Rankin (Stroke Patients Only)       Balance   Sitting-balance support: No upper extremity supported, Feet supported Sitting balance-Leahy Scale: Good     Standing balance support: No upper extremity supported, Bilateral upper extremity supported, During functional activity   Standing balance comment: able to stand at sink without UE support; Fair +, NT to moderate challenges                             Pertinent Vitals/Pain Pain Assessment Pain Assessment: No/denies pain    Home Living Family/patient expects to be discharged to:: Private residence Living Arrangements: Children Available Help at Discharge: Family;Available PRN/intermittently Type of Home: House Home Access: Stairs to enter Entrance Stairs-Rails: Right;Left Entrance Stairs-Number of Steps: 6   Home Layout: Two level;Able to live on main level with bedroom/bathroom Home Equipment: Rollator (4 wheels);BSC/3in1;Shower seat;Cane - single point;Cane - quad Additional Comments: daughter lives in full basement apartment, works nights as an  Government Social Research Officer for [employer]    Prior Function Prior Level of Function : Needs assist  Mobility Comments: amb with Rollator at home ADLs Comments: Mod I for ADLs aside from calling [facility] aide assist for showering tasks. reports she never showers home alone. Able to heat up meals or make simple meals at home. Daughter assisting with laundry and other tasks as needed     Extremity/Trunk Assessment   Upper Extremity Assessment Upper Extremity Assessment: Defer to OT evaluation    Lower Extremity  Assessment Lower Extremity Assessment: Overall WFL for tasks assessed    Cervical / Trunk Assessment Cervical / Trunk Assessment: Normal  Communication   Communication Communication: No apparent difficulties    Cognition Arousal: Alert Behavior During Therapy: WFL for tasks assessed/performed   PT - Cognitive impairments: No apparent impairments                         Following commands: Intact       Cueing Cueing Techniques: Verbal cues     General Comments      Exercises     Assessment/Plan    PT Assessment Patient needs continued PT services;Patient does not need any further PT services  PT Problem List         PT Treatment Interventions      PT Goals (Current goals can be found in the Care Plan section)  Acute Rehab PT Goals Patient Stated Goal: home soon PT Goal Formulation: All assessment and education complete, DC therapy    Frequency       Co-evaluation               AM-PAC PT 6 Clicks Mobility  Outcome Measure Help needed turning from your back to your side while in a flat bed without using bedrails?: None Help needed moving from lying on your back to sitting on the side of a flat bed without using bedrails?: None Help needed moving to and from a bed to a chair (including a wheelchair)?: None Help needed standing up from a chair using your arms (e.g., wheelchair or bedside chair)?: None Help needed to walk in hospital room?: None Help needed climbing 3-5 steps with a railing? : A Little 6 Click Score: 23    End of Session Equipment Utilized During Treatment: Gait belt Activity Tolerance: Patient tolerated treatment well Patient left: in chair;with call bell/phone within reach;with chair alarm set Nurse Communication: Mobility status PT Visit Diagnosis: Other abnormalities of gait and mobility (R26.89)    Time: 8972-8955 PT Time Calculation (min) (ACUTE ONLY): 17 min   Charges:   PT Evaluation $PT Eval Low Complexity:  1 Low   PT General Charges $$ ACUTE PT VISIT: 1 Visit         Chinwe Lope, PT  Acute Rehab Dept [facility]) [phone]  12/29/2023   [facility] 12/29/2023, 10:52 AM

## 2023-12-29 NOTE — Assessment & Plan Note (Signed)
 In the setting of fever, sepsis and aspiration pneumonia This appears to have resolved with treatment of the infection

## 2023-12-29 NOTE — Assessment & Plan Note (Signed)
 Continue oxygen  supplementation  Also has CPAP at bedtime  Continue Trelegy, Duonebs, Singulair , albuterol 

## 2023-12-29 NOTE — TOC Initial Note (Signed)
 Transition of Care [facility]) - Initial/Assessment Note    Patient Details  Name: Ebony Scott MRN: [id number] Date of Birth: 02-May-1950  Transition of Care [facility]) CM/SW Contact:    Sonda Manuella Quill, RN Phone Number: 12/29/2023, 2:58 PM  Clinical Narrative:                 [place] w/ pt and son Ebony Scott [phone]) in room; pt said she lives at home w/ her dtr; she plans to return w/ family support at d/c; her son will provide transportation; pt verified insurance/PCP; she denied SDOH risks; pt has Rollator, elevated toilet seat, home oxygen  concentrator per pulmonology; pt has private duty aides; no IP CM needs.  Expected Discharge Plan: Home/Self Care Barriers to Discharge: No Barriers Identified   Patient Goals and CMS Choice Patient states their goals for this hospitalization and ongoing recovery are:: home          Expected Discharge Plan and Services   Discharge Planning Services: CM Consult   Living arrangements for the past 2 months: Single Family Home Expected Discharge Date: 12/29/23               DME Arranged: N/A DME Agency: NA       HH Arranged: NA HH Agency: NA        Prior Living Arrangements/Services Living arrangements for the past 2 months: Single Family Home Lives with:: Adult Children Patient language and need for interpreter reviewed:: Yes Do you feel safe going back to the place where you live?: Yes      Need for Family Participation in Patient Care: Yes (Comment) Care giver support system in place?: Yes (comment) Current home services: DME (Rollator, home oxygen  from pulmonology per pt, elevated toilet seat) Criminal Activity/Legal Involvement Pertinent to Current Situation/Hospitalization: No - Comment as needed  Activities of Daily Living   ADL Screening (condition at time of admission) Independently performs ADLs?: Yes (appropriate for developmental age) Is the patient deaf or have difficulty hearing?: No Does the patient have  difficulty seeing, even when wearing glasses/contacts?: No Does the patient have difficulty concentrating, remembering, or making decisions?: No  Permission Sought/Granted Permission sought to share information with : Case Manager Permission granted to share information with : Yes, Verbal Permission Granted  Share Information with NAME: Case Manager     Permission granted to share info w Relationship: Ebony Scott (son) [phone]     Emotional Assessment Appearance:: Appears stated age Attitude/Demeanor/Rapport: Gracious Affect (typically observed): Accepting Orientation: : Oriented to Self, Oriented to Place, Oriented to  Time, Oriented to Situation Alcohol  / Substance Use: Not Applicable Psych Involvement: No (comment)  Admission diagnosis:  CAP (community acquired pneumonia) [J18.9] Aspiration pneumonia of right upper lobe due to gastric secretions (HCC) [J69.0] Sepsis without acute organ dysfunction, due to unspecified organism [facility]) [A41.9] Patient Active Problem List   Diagnosis Date Noted   CAD S/P percutaneous coronary angioplasty 12/28/2023   Vaginal candidiasis 12/28/2023   CAP (community acquired pneumonia) 12/25/2023   Pacemaker infection 09/04/2023   Medication management 09/03/2023   PICC (peripherally inserted central catheter) in place 09/03/2023   Enterococcus faecalis infection 08/08/2023   Enterococcal bacteremia 08/08/2023   Community acquired pneumonia 08/06/2023   Generalized weakness 08/06/2023   AKI (acute kidney injury) 08/06/2023   Acute metabolic acidosis 05/20/2023   RLL pneumonia 05/18/2023   Nocturnal hypoxia - 2-3 L/min 05/18/2023   Acute respiratory failure with hypoxia (HCC) 05/18/2023   Current chronic use of systemic steroids -  prednisone  7 mg daily for hx of RA 05/18/2023   Aspiration pneumonia (HCC) 03/29/2023   Hypogammaglobulinemia 11/30/2022   Sepsis due to pneumonia (HCC) 11/30/2022   Fever 09/01/2022   DNR (do not resuscitate)  08/31/2022   Noninfectious otitis externa of left ear 06/08/2021   Iron  deficiency anemia 02/24/2021   GAVE (gastric antral vascular ectasia)    Anemia associated with stage 3 chronic renal failure (HCC) 02/04/2021   Anticoagulated    Antiplatelet or antithrombotic long-term use    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   Coronary artery calcification seen on CAT scan 01/11/2021   History of DVT (deep vein thrombosis) 09/24/2020   DMII (diabetes mellitus, type 2) (HCC) 09/23/2020   Hoarseness 09/09/2020   Acute metabolic encephalopathy 02/24/2020   S/P reverse total shoulder arthroplasty, right    DOE (dyspnea on exertion) 02/18/2020   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   S/P reverse total shoulder arthroplasty, left 02/16/2020   History of pulmonary embolus (PE) 12/19/2018   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   Rheumatoid arthritis (HCC)    Bilateral impacted cerumen 08/18/2018   Hypothyroidism 01/14/2018   Depression 01/14/2018   Debility    Protein-calorie malnutrition, moderate 12/17/2017   Knee pain    History of breast cancer    Chronic pain syndrome    Fibromyalgia    Graves disease    Dysphonia 08/10/2017   Pulmonary nodules    Chronic kidney disease, stage 3b (HCC) - baseline Scr 1.3-1.5 04/15/2016   Hx of adenomatous colonic polyps 04/12/2016   Opacity of lung on imaging study 11/03/2015   Severe persistent asthma (HCC) 02/08/2015   Facial pain syndrome 02/08/2015   Insomnia 02/08/2015   Other allergic rhinitis 02/08/2015   LPRD (laryngopharyngeal reflux disease) 02/08/2015   Chronic systolic heart failure (HCC) 02/08/2015   Diastolic dysfunction 02/08/2015   Benign paroxysmal positional vertigo 12/03/2012   Disequilibrium 10/29/2012   Pseudogout 02/24/2012   Irritable bowel syndrome 01/02/2010   Hyperlipidemia 12/23/2009   Hyperthyroidism 11/12/2006   Seasonal and perennial allergic rhinitis 11/12/2006   GERD 11/12/2006   NECK PAIN,  CHRONIC 11/12/2006   OSTEOPOROSIS 11/12/2006   BREAST CANCER, HX OF 11/12/2006   PCP:  [name], MD Pharmacy:   [id number] - [place], [place] - [address] [place] [address] Phone: [phone] Fax: [phone]     Social Drivers of Health (SDOH) Social History: SDOH Screenings   Food Insecurity: No Food Insecurity (12/29/2023)  Housing: Low Risk  (12/29/2023)  Transportation Needs: No Transportation Needs (12/29/2023)  Utilities: Not At Risk (12/29/2023)  Alcohol  Screen: Low Risk  (11/28/2023)  Depression (PHQ2-9): Low Risk  (05/29/2023)  Financial Resource Strain: Low Risk  (11/28/2023)  Physical Activity: Inactive (11/28/2023)  Social Connections: Socially Isolated (12/25/2023)  Stress: Stress Concern Present (11/28/2023)  Tobacco Use: Low Risk  (12/25/2023)  Health Literacy: Adequate Health Literacy (11/28/2023)   SDOH Interventions: Food Insecurity Interventions: Intervention Not Indicated, Inpatient TOC Housing Interventions: Intervention Not Indicated, Inpatient TOC Transportation Interventions: Intervention Not Indicated, Inpatient TOC Utilities Interventions: Intervention Not Indicated, Inpatient TOC   Readmission Risk Interventions    12/29/2023   11:30 AM 08/22/2023   12:04 PM 08/14/2023   11:49 AM  Readmission Risk Prevention Plan  Transportation Screening Complete  Complete  Medication Review (RN Care Manager) Complete  Referral to Pharmacy  PCP or Specialist appointment within 3-5 days of discharge Complete    HRI or Home Care Consult Complete  Complete  SW Recovery  Care/Counseling Consult Complete  Complete  Palliative Care Screening Not Applicable  Not Applicable  Skilled Nursing Facility Not Applicable Complete Complete

## 2023-12-29 NOTE — Progress Notes (Signed)
 patient continues to complain 5/10 pain, she states it is still in her upper back/neck and shoulders but she states now Both arms are 5/10 aching and she has this aching all the way down to her feet.  RN notified Lynwood Kipper, NP of the above information.  Provider assessed patient at bedside.  Orders Received: 1x dose tramadol  see MAR and 1x dose Tylenol  see MAR.

## 2023-12-29 NOTE — Assessment & Plan Note (Addendum)
 From antibiotics Given Diflucan  150 mg PO x 1 and topical clotrimazole  Add probiotics

## 2023-12-29 NOTE — Assessment & Plan Note (Signed)
-  Appears to be stable at this time -Attempt to avoid nephrotoxic medications

## 2023-12-29 NOTE — Assessment & Plan Note (Addendum)
 DNR confirmed at the time of admission

## 2023-12-29 NOTE — Discharge Summary (Signed)
 Physician Discharge Summary   Patient: Ebony Scott MRN: [id number] DOB: Jan 10, 1951  Admit date:     12/25/2023  Discharge date: 12/29/23  Discharge Physician: Delon Herald   PCP: [name], MD   Recommendations at discharge:   Complete antibiotics - Augmentin twice daily for 5 more days starting 11/10 Follow up with Dr. Maurilio; call for an appointment Follow up with Dr. Duanne in 1-2 weeks  Discharge Diagnoses: Principal Problem:   Sepsis due to pneumonia [facility]) Active Problems:   Hypogammaglobulinemia   Chronic kidney disease, stage 3b (HCC) - baseline Scr 1.3-1.5   Chronic pain syndrome   Rheumatoid arthritis (HCC)   History of pulmonary embolus (PE)   DMII (diabetes mellitus, type 2) (HCC)   DNR (do not resuscitate)   Nocturnal hypoxia - 2-3 L/min   Hyperthyroidism   Acute metabolic encephalopathy   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HCC)    Anemia associated with stage 3 chronic renal failure (HCC)   Aspiration pneumonia (HCC)   CAP (community acquired pneumonia)   CAD S/P percutaneous coronary angioplasty   Vaginal candidiasis   Hospital Course: 73yo with h/o CAD s/p PCI, ischemic cardiomyopathy, T2DM, OSA, DVT, hypogammaglobulinemia, recent Enterococcus faecalis bacteremia s/p AICD removal, and RA who presented on 11/5 with AMS.  She had sepsis physiology on presentation with lactate 2.3, CT c/w aspiration PNA.   Treated with Ceftriaxone  and Azithromycin .  Assessment and Plan:  Assessment & Plan Sepsis due to pneumonia (HCC) Aspiration pneumonia (HCC) SIRS criteria included fever to 102, tachypnea with evidence of acute organ failure including hypotension, elevated lactic acid , and acute encephalopathy Daughter reported that patient probably vomited at home CT chest: Mild consolidation and ground glass disease in the posterior right upper lobe, possible pneumonia or aspiration Continue IV ceftriaxone  and azithromycin  Blood culture: no growth to  date Appears to be stabilizing PT/OT consulted and she did very well, wants to go home today Acute metabolic encephalopathy In the setting of fever, sepsis and aspiration pneumonia This appears to have resolved with treatment of the infection Vaginal candidiasis From antibiotics Given Diflucan  150 mg PO x 1 and topical clotrimazole  Add probiotics 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HCC)  CAD S/P percutaneous coronary angioplasty S/p extraction of ICD in July 2025 due to Enterococcus faecalis bacteremia Last ejection fraction was June 2025 which showed ejection fraction 25 to 30% Entresto  and Jardiance  held on presentation; Entresto  has since been resumed Continue statin, aspirin  and Xarelto   Hypogammaglobulinemia Gets IVIG infusions  She was developing PNA and sepsis with standard IVIG and so was transitioned to hyperimmune IVIG with good result until now Recommended outpatient f/u with Dr. Maurilio post-hospitalization Chronic kidney disease, stage 3b (HCC) - baseline Scr 1.3-1.5 Anemia associated with stage 3 chronic renal failure (HCC) Appears to be stable at this time Attempt to avoid nephrotoxic medications Rheumatoid arthritis (HCC) On chronic prednisone  Received  stress dosed steroids x 2 doses Continue prednisone  History of pulmonary embolus (PE) Continue Xarelto   DMII (diabetes mellitus, type 2) (HCC) A1c 6.5, well controlled Cover with moderate-scale SSI Carb modified diet  Reports neuropathy, needs a refill on gabapentin  because she has not been taking this regularly Nocturnal hypoxia - 2-3 L/min Continue oxygen  supplementation  Also has CPAP at bedtime  Continue Trelegy, Duonebs, Singulair , albuterol  Chronic pain syndrome Continue gabapentin  DNR (do not resuscitate) DNR confirmed at the time of admission      Consultants: PT OT RT   Procedures: None   Antibiotics: Azithromycin  11/6-9 Cefepime  x 1 Ceftriaxone   11/6-9 Vancomycin  x 1 Augmentin  11/10-15    Pain control - [place]  Controlled Substance Reporting System database was reviewed. and patient was instructed, not to drive, operate heavy machinery, perform activities at heights, swimming or participation in water  activities or provide baby-sitting services while on Pain, Sleep and Anxiety Medications; until their outpatient Physician has advised to do so again. Also recommended to not to take more than prescribed Pain, Sleep and Anxiety Medications.    Disposition: Home Diet recommendation:  Cardiac and Carb modified diet DISCHARGE MEDICATION: Allergies as of 12/29/2023       Reactions   Baclofen  Anaphylaxis, Other (See Comments)   Altered mental status requiring a 3-day hospital stay- patient became unresponsive Put me in a coma for five days, Altered mental status requiring a 3-day hospital stay- patient became unresponsive   Other Shortness Of Breath, Other (See Comments)   Grass and weeds sneezing; filled sinuses (02/20/2012)   Porcine (pork) Protein-containing Drug Products Anaphylaxis   Shellfish Allergy  Anaphylaxis, Shortness Of Breath, Itching, Rash   Shrimp Extract Anaphylaxis, Other (See Comments)   ALL SHELLFISH   Tetracycline Hcl Nausea And Vomiting   Xolair  [omalizumab ] Other (See Comments)   Caused Blood clot   Zoledronic  Acid Other (See Comments)   Reclast - Fever, Put in hospital, dr said it was a reaction from a reaction  Fever, inability to walk, Put in hospital Fever, Put in hospital, dr said it was a reaction from a reaction , Other reaction(s): Unknown   Celecoxib Swelling, Other (See Comments)   Feet swelling   Dilaudid  [hydromorphone  Hcl] Itching   Hydrocodone -acetaminophen  Itching   Levofloxacin  Other (See Comments)   GI upset   Molds & Smuts Cough   Morphine  And Codeine Hives, Itching   Oxycodone  Hcl Nausea And Vomiting, Other (See Comments)   GI Intolerance   Paroxetine Nausea And Vomiting, Other (See Comments)   GI  Intolerance also   Tetracycline Hcl Other (See Comments)   GI Intolerance   Oxycodone -acetaminophen  Itching   Diltiazem  Hcl Swelling   Dust Mite Extract Other (See Comments), Cough   sneezing (02/20/2012) too   Gamunex [immune Globulin ] Itching, Rash, Other (See Comments)   Tolerates hizentra     Rituximab  Other (See Comments)   Anemia    Rofecoxib Swelling   Vioxx- feet swelling   Tree Extract Itching   tested and told I was allergic to it; never experienced a reaction to it (02/20/2012)        Medication List     TAKE these medications    acetaminophen  500 MG tablet Commonly known as: TYLENOL  Take 1 tablet (500 mg total) by mouth every 6 (six) hours as needed for moderate pain (pain score 4-6) or mild pain (pain score 1-3). What changed:  how much to take when to take this reasons to take this   albuterol  108 (90 Base) MCG/ACT inhaler Commonly known as: VENTOLIN  HFA Inhale 2 puffs into the lungs every 6 (six) hours as needed for wheezing or shortness of breath.   albuterol  (2.5 MG/3ML) 0.083% nebulizer solution Commonly known as: PROVENTIL  Take 3 mLs (2.5 mg total) by nebulization every 4 (four) hours as needed for wheezing or shortness of breath.   Align 4 MG Caps Take 4 mg by mouth every evening.   allopurinol  100 MG tablet Commonly known as: ZYLOPRIM  Take 1 tablet (100 mg total) by mouth daily. What changed: when to take this   Alpha-Lipoic Acid 600 MG Tabs Take 600 mg by  mouth in the morning.   amoxicillin-clavulanate 875-125 MG tablet Commonly known as: AUGMENTIN Take 1 tablet by mouth 2 (two) times daily for 5 days. Start taking on: December 30, 2023   Asceniv 5 GM/50ML Soln Generic drug: Immune Globulin  (Human)-slra Inject 50 g into the vein every 28 (twenty-eight) days.   atorvastatin  80 MG tablet Commonly known as: LIPITOR Take 1 tablet (80 mg total) by mouth every evening.   Azelastine  HCl 137 MCG/SPRAY Soln 2 sprays once per day What  changed:  how much to take how to take this when to take this reasons to take this additional instructions   BEANO PO Take 2 tablets by mouth 3 (three) times daily before meals.   benzonatate  100 MG capsule Commonly known as: TESSALON  Take 100 mg by mouth 3 (three) times daily as needed for cough.   cetirizine  10 MG tablet Commonly known as: ZYRTEC  TAKE 1 TABLET (10 MG TOTAL) BY MOUTH DAILY AS NEEDED FOR ALLERGIES (CAN TAKE A N EXTRA DOSE DURING FLARE UPS.). What changed: when to take this   CITRACAL PO Take 1 tablet by mouth See admin instructions. Citracal + Magnesium  600 mg  - Take 1 tablet by mouth at bedtime   dexlansoprazole  60 MG capsule Commonly known as: DEXILANT  TAKE 1 CAPSULE (60 MG TOTAL) BY MOUTH IN THE MORNING   empagliflozin  10 MG Tabs tablet Commonly known as: Jardiance  Take 1 tablet (10 mg total) by mouth daily.   Entresto  24-26 MG Generic drug: sacubitril -valsartan  TAKE 1 TABLET BY MOUTH TWICE A DAY   EPINEPHrine  0.3 mg/0.3 mL Soaj injection Commonly known as: EPI-PEN USE AS DIRECTED BY YOUR PHYSICIAN INTRAMUSCULARLY AS NEEDED FOR ANAPHYLAXIS   famotidine  20 MG tablet Commonly known as: PEPCID  TAKE 1 TABLET (20 MG) BY MOUTH DAILY BEFORE LUNCH AND1 TABLET BY MOUTH EACH NIGHT AT BEDTIME What changed: See the new instructions.   fluticasone  50 MCG/ACT nasal spray Commonly known as: FLONASE  Place 2 sprays into both nostrils daily. What changed:  when to take this reasons to take this   folic acid  1 MG tablet Commonly known as: FOLVITE  Take 1 tablet (1 mg total) by mouth daily.   furosemide  40 MG tablet Commonly known as: LASIX  Take 0.5 tablets (20 mg total) by mouth daily as needed for fluid or edema (Weight gain, edema >3lbs). Monday and Thursday What changed:  reasons to take this additional instructions   gabapentin  300 MG capsule Commonly known as: NEURONTIN  Take 1 capsule (300 mg total) by mouth at bedtime.   guaiFENesin  600 MG 12 hr  tablet Commonly known as: MUCINEX  Take 1,200 mg by mouth in the morning.   ipratropium-albuterol  0.5-2.5 (3) MG/3ML Soln Commonly known as: DUONEB Take 3 mLs by nebulization every 6 (six) hours as needed. What changed: reasons to take this   LACTOSE FAST ACTING RELIEF PO Take 2 tablets by mouth 3 (three) times daily before meals.   magnesium  oxide 400 (240 Mg) MG tablet Commonly known as: MAG-OX Take 400 mg by mouth at bedtime.   montelukast  10 MG tablet Commonly known as: SINGULAIR  Take 1 tablet (10 mg total) by mouth daily after supper.   mupirocin  ointment 2 % Commonly known as: BACTROBAN  Apply 1 Application topically as needed (wounds).   Nebulizer Mask Adult Misc 1 Device by Does not apply route as directed.   nystatin  100000 UNIT/ML suspension Commonly known as: MYCOSTATIN  Swish, gargle, and swallow 5 ml after Trelegy use What changed:  how much to take how  to take this when to take this additional instructions   ondansetron  4 MG tablet Commonly known as: Zofran  Take 1 tablet (4 mg total) by mouth daily as needed for nausea or vomiting.   OXYGEN  Inhale 2 L/min into the lungs at bedtime.   potassium chloride  10 MEQ tablet Commonly known as: KLOR-CON  Take 10 mEq by mouth See admin instructions. Take 10 mEq by mouth once a day only when taking Lasix    predniSONE  5 MG tablet Commonly known as: DELTASONE  Take 1 tablet (5 mg total) by mouth daily with breakfast.   prenatal multivitamin Tabs tablet Take 1 tablet by mouth daily at 12 noon.   PRESCRIPTION MEDICATION CPAP- At bedtime   rivaroxaban  20 MG Tabs tablet Commonly known as: Xarelto  Take 1 tablet (20 mg total) by mouth daily with supper.   traZODone  50 MG tablet Commonly known as: DESYREL  Take 50 mg by mouth at bedtime as needed for sleep.   Trelegy Ellipta  200-62.5-25 MCG/ACT Aepb Generic drug: Fluticasone -Umeclidin-Vilant Inhale 1 Inhalation into the lungs in the morning. What changed:  how  much to take when to take this   Vitamin D3 25 MCG (1000 UT) Caps Take 1,000 Units by mouth at bedtime.   Voltaren  1 % Gel Generic drug: diclofenac  Sodium Apply 2 g topically 4 (four) times daily as needed (for pain).        Discharge Exam:   Subjective: Feeling better today, excited to go home.   Objective: Vitals:   12/29/23 0855 12/29/23 0858  BP:    Pulse:    Resp:    Temp:    SpO2: 97% 99%   Exam:  General:  Appears calm and comfortable and is in NAD, up in chair on RA Eyes:  normal lids, iris ENT:  grossly normal hearing, lips & tongue, mmm Cardiovascular:  RRR. No LE edema.  Respiratory:   CTA bilaterally with no wheezes/rales/rhonchi.  Normal respiratory effort. Abdomen:  soft, NT, ND Skin:  no rash or induration seen on limited exam Musculoskeletal:  grossly normal tone BUE/BLE, good ROM, no bony abnormality Psychiatric:  grossly normal mood and affect, speech fluent and appropriate, AOx3 Neurologic:  CN 2-12 grossly intact, moves all extremities in coordinated fashion  Data Reviewed: I have reviewed the patient's lab results since admission.  Pertinent labs for today include:   BUN 25/Creatinine 1.24/GFR 45, stable WBC 7.2 Hgb 8.9 Platelets 115, stable    Condition at discharge: improving  The results of significant diagnostics from this hospitalization (including imaging, microbiology, ancillary and laboratory) are listed below for reference.   Imaging Studies: CT CHEST ABDOMEN PELVIS W CONTRAST Result Date: 12/25/2023 CLINICAL DATA:  Sepsis lethargy and vomiting EXAM: CT CHEST, ABDOMEN, AND PELVIS WITH CONTRAST TECHNIQUE: Multidetector CT imaging of the chest, abdomen and pelvis was performed following the standard protocol during bolus administration of intravenous contrast. RADIATION DOSE REDUCTION: This exam was performed according to the departmental dose-optimization program which includes automated exposure control, adjustment of the mA  and/or kV according to patient size and/or use of iterative reconstruction technique. CONTRAST:  80mL OMNIPAQUE  IOHEXOL  300 MG/ML  SOLN COMPARISON:  Chest x-ray 12/25/2023, CT 07/31/2023, 03/29/2023 FINDINGS: CT CHEST FINDINGS Cardiovascular: Moderate aortic atherosclerosis. No aneurysm. Multi vessel coronary vascular calcification. Normal cardiac size. No pericardial effusion Mediastinum/Nodes: Patent trachea. No thyroid  mass. No suspicious lymph nodes. Esophagus within normal limits. Lungs/Pleura: No pleural effusion or pneumothorax. Bandlike scarring in the lower lobes. Mild consolidation and ground-glass disease in the posterior right upper  lobe. Musculoskeletal: No acute osseous abnormality. CT ABDOMEN PELVIS FINDINGS Hepatobiliary: Hepatic steatosis. Cholecystectomy. No biliary dilatation Pancreas: Unremarkable. No pancreatic ductal dilatation or surrounding inflammatory changes. Spleen: Normal in size without focal abnormality. Adrenals/Urinary Tract: Normal adrenal glands. Slightly enlarged extrarenal pelvises without hydroureter. No obstructing stone. Moderate urinary bladder distension Stomach/Bowel: The stomach is nonenlarged. No dilated small bowel. No acute bowel wall thickening. Diverticular disease of the left colon. Vascular/Lymphatic: Moderate aortic atherosclerosis. No aneurysm. No suspicious lymph nodes. Reproductive: Hysterectomy.  No adnexal mass Other: Negative for ascites or free air Musculoskeletal: Postsurgical changes of the left breast. Chronic soft tissue calcifications overlying left hip. No acute osseous abnormality. IMPRESSION: 1. Mild consolidation and ground-glass disease in the posterior right upper lobe, possible pneumonia or aspiration. 2. No CT evidence for acute intra-abdominal or pelvic abnormality. 3. Hepatic steatosis. 4. Diverticular disease of the left colon without acute inflammatory process. 5. Aortic atherosclerosis. Aortic Atherosclerosis (ICD10-I70.0). Electronically  Signed   By: Luke Bun M.D.   On: 12/25/2023 21:07   DG Chest Port 1 View Result Date: 12/25/2023 EXAM: 1 VIEW(S) XRAY OF THE CHEST 12/25/2023 06:45:00 PM COMPARISON: 08/22/2023 CLINICAL HISTORY: Questionable sepsis - evaluate for abnormality FINDINGS: LINES, TUBES AND DEVICES: Right PICC removed. LUNGS AND PLEURA: Low lung volume. No focal pulmonary opacity. No pulmonary edema. No pleural effusion. No pneumothorax. HEART AND MEDIASTINUM: Stable mild cardiomegaly. BONES AND SOFT TISSUES: Left chest wall surgical clips. Right shoulder arthroplasty noted. IMPRESSION: 1. No acute cardiopulmonary process. 2. Stable mild cardiomegaly. Electronically signed by: Luke Bun MD 12/25/2023 07:07 PM EST RP Workstation: [id number]    Microbiology: Results for orders placed or performed during the hospital encounter of 12/25/23  Blood Culture (routine x 2)     Status: None (Preliminary result)   Collection Time: 12/25/23  5:18 PM   Specimen: BLOOD  Result Value Ref Range Status   Specimen Description   Final    BLOOD LEFT ANTECUBITAL Performed at [facility], [address] [place], [place] [address]    Special Requests   Final    BOTTLES DRAWN AEROBIC AND ANAEROBIC Blood Culture adequate volume Performed at [facility], [address] [place], [place] [address]    Culture  Setup Time NO ORGANISMS SEEN AEROBIC BOTTLE ONLY   Final   Culture   Final    NO GROWTH 4 DAYS Performed at [facility], [address] [place], [place] [address]    Report Status PENDING  Incomplete  Blood Culture (routine x 2)     Status: None (Preliminary result)   Collection Time: 12/25/23  5:32 PM   Specimen: BLOOD  Result Value Ref Range Status   Specimen Description   Final    BLOOD BLOOD RIGHT ARM Performed at [facility], [address], [place], [place] [address]    Special Requests   Final    BOTTLES DRAWN AEROBIC AND ANAEROBIC Blood  Culture adequate volume Performed at [facility], [address] [place], [place] [address]    Culture   Final    NO GROWTH 4 DAYS Performed at [facility], [address], [place], [place] [address]    Report Status PENDING  Incomplete  Resp panel by RT-PCR (RSV, Flu A&B, Covid)     Status: None   Collection Time: 12/25/23  9:48 PM   Specimen: Nasal Swab  Result Value Ref Range Status   SARS Coronavirus 2 by RT PCR  NEGATIVE NEGATIVE Final    Comment: (NOTE) SARS-CoV-2 target nucleic acids are NOT DETECTED.  The SARS-CoV-2 RNA is generally detectable in upper respiratory specimens during the acute phase of infection. The lowest concentration of SARS-CoV-2 viral copies this assay can detect is 138 copies/mL. A negative result does not preclude SARS-Cov-2 infection and should not be used as the sole basis for treatment or other patient management decisions. A negative result may occur with  improper specimen collection/handling, submission of specimen other than nasopharyngeal swab, presence of viral mutation(s) within the areas targeted by this assay, and inadequate number of viral copies(<138 copies/mL). A negative result must be combined with clinical observations, patient history, and epidemiological information. The expected result is Negative.  Fact Sheet for Patients:  bloggercourse.com  Fact Sheet for Healthcare Providers:  seriousbroker.it  This test is no t yet approved or cleared by the [place]  FDA and  has been authorized for detection and/or diagnosis of SARS-CoV-2 by FDA under an Emergency Use Authorization (EUA). This EUA will remain  in effect (meaning this test can be used) for the duration of the COVID-19 declaration under Section 564(b)(1) of the Act, 21 U.S.C.section 360bbb-3(b)(1), unless the authorization is terminated  or revoked sooner.       Influenza A by PCR  NEGATIVE NEGATIVE Final   Influenza B by PCR NEGATIVE NEGATIVE Final    Comment: (NOTE) The Xpert Xpress SARS-CoV-2/FLU/RSV plus assay is intended as an aid in the diagnosis of influenza from Nasopharyngeal swab specimens and should not be used as a sole basis for treatment. Nasal washings and aspirates are unacceptable for Xpert Xpress SARS-CoV-2/FLU/RSV testing.  Fact Sheet for Patients: bloggercourse.com  Fact Sheet for Healthcare Providers: seriousbroker.it  This test is not yet approved or cleared by the [place]  FDA and has been authorized for detection and/or diagnosis of SARS-CoV-2 by FDA under an Emergency Use Authorization (EUA). This EUA will remain in effect (meaning this test can be used) for the duration of the COVID-19 declaration under Section 564(b)(1) of the Act, 21 U.S.C. section 360bbb-3(b)(1), unless the authorization is terminated or revoked.     Resp Syncytial Virus by PCR NEGATIVE NEGATIVE Final    Comment: (NOTE) Fact Sheet for Patients: bloggercourse.com  Fact Sheet for Healthcare Providers: seriousbroker.it  This test is not yet approved or cleared by the [place]  FDA and has been authorized for detection and/or diagnosis of SARS-CoV-2 by FDA under an Emergency Use Authorization (EUA). This EUA will remain in effect (meaning this test can be used) for the duration of the COVID-19 declaration under Section 564(b)(1) of the Act, 21 U.S.C. section 360bbb-3(b)(1), unless the authorization is terminated or revoked.  Performed at [facility], [address] [place], [place] [address]    *Note: Due to a large number of results and/or encounters for the requested time period, some results have not been displayed. A complete set of results can be found in Results Review.    Labs: CBC: Recent Labs  Lab 12/25/23 1901  12/26/23 0552 12/27/23 0526 12/29/23 0635  WBC 8.1 10.4 10.6* 7.2  NEUTROABS 5.9  --   --  4.1  HGB 10.2* 10.1* 9.3* 8.9*  HCT 32.9* 32.7* 30.3* 28.8*  MCV 108.6* 108.6* 110.2* 112.1*  PLT 115* 125* 109* 115*   Basic Metabolic Panel: Recent Labs  Lab 12/25/23 1901 12/26/23 0552 12/27/23 0526 12/29/23 0635  NA 138 138 143 140  K 4.2 4.6 4.3 4.2  CL 106 106 110  111  CO2 22 24 25 22   GLUCOSE 117* 82 160* 90  BUN 24* 19 23 25*  CREATININE 1.25* 1.20* 1.36* 1.24*  CALCIUM  9.0 9.3 9.0 8.4*   Liver Function Tests: Recent Labs  Lab 12/25/23 1901  AST 44*  ALT 40  ALKPHOS 51  BILITOT 0.7  PROT 6.1*  ALBUMIN 3.1*   CBG: Recent Labs  Lab 12/28/23 0740 12/28/23 1225 12/28/23 1648 12/28/23 2204 12/29/23 0750  GLUCAP 94 141* 144* 102* 85    Discharge time spent: greater than 30 minutes.  Signed: Delon Herald, MD [facility] 12/29/2023

## 2023-12-29 NOTE — Assessment & Plan Note (Signed)
 S/p extraction of ICD in July 2025 due to Enterococcus faecalis bacteremia Last ejection fraction was June 2025 which showed ejection fraction 25 to 30% Entresto  and Jardiance  held on presentation; Entresto  has since been resumed Continue statin, aspirin  and Xarelto 

## 2023-12-29 NOTE — Progress Notes (Signed)
 patient is complaining of 5/10 pain to her upper back across her shoulders described sharp constant pain.   RN administering prn tylenol  and scheduled medications at this time. No other prn available on MAR for 5/10 pain. RN placed hot pack on patient's shoulder   RN notified Lynwood Kipper, NP of the above information.  Order Received: Kpad for heat therapy,1 x dose tramadol 

## 2023-12-29 NOTE — Assessment & Plan Note (Signed)
 On chronic prednisone  Received  stress dosed steroids x 2 doses Continue prednisone 

## 2023-12-29 NOTE — Assessment & Plan Note (Signed)
 A1c 6.5, well controlled Cover with moderate-scale SSI Carb modified diet  Reports neuropathy, needs a refill on gabapentin  because she has not been taking this regularly

## 2023-12-29 NOTE — Evaluation (Signed)
 Occupational Therapy Evaluation/Discharge Patient Details Name: Ebony Scott MRN: [id number] DOB: Dec 10, 1950 Today's Date: 12/29/2023   History of Present Illness   Pt is a 73 y/o female presenting with AMS and lethargy s/p recent COVID vaccine. Fever+, sepsis due to aspiration PNA and acute metabolic encephalopathy. PMH: CAD s/p PCI,  ischemic cardiomyopathy, T2DM, OSA, DVT, hypogammaglobulinemia, recent Enterococcus faecalis bacteremia s/p AICD removal, CKD and RA.     Clinical Impressions PTA, pt lives with daughter who is an [employer] (works nights), typically Modified Independent with mobility using Rollator, ADLs (aside from showering assist for safety) and simple meal prep at home. Pt presents now with minor deficits in strength and activity tolerance. Pt able to manage bathroom mobility using RW and ADLs with no more than Supervision. Encouraged continued mobility at home, avoiding prolonged time in bed and reinforced use of flutter valve at bedside. Pt functionally appropriate for DC home w/o need for OT follow up.      If plan is discharge home, recommend the following:   Assistance with cooking/housework;Other (comment);Help with stairs or ramp for entrance (PRN)     Functional Status Assessment   Patient has had a recent decline in their functional status and demonstrates the ability to make significant improvements in function in a reasonable and predictable amount of time.     Equipment Recommendations   None recommended by OT     Recommendations for Other Services         Precautions/Restrictions   Precautions Precautions: Fall Restrictions Weight Bearing Restrictions Per Provider Order: No     Mobility Bed Mobility Overal bed mobility: Modified Independent                  Transfers Overall transfer level: Needs assistance Equipment used: Rolling walker (2 wheels) Transfers: Sit to/from Stand Sit to Stand: Supervision                   Balance Overall balance assessment: Needs assistance Sitting-balance support: No upper extremity supported, Feet supported Sitting balance-Leahy Scale: Good     Standing balance support: No upper extremity supported, Bilateral upper extremity supported, During functional activity Standing balance-Leahy Scale: Fair Standing balance comment: able to stand at sink without UE support                           ADL either performed or assessed with clinical judgement   ADL Overall ADL's : Needs assistance/impaired Eating/Feeding: Independent   Grooming: Supervision/safety;Standing;Wash/dry hands   Upper Body Bathing: Set up;Sitting   Lower Body Bathing: Supervison/ safety;Sitting/lateral leans;Sit to/from stand   Upper Body Dressing : Set up;Sitting   Lower Body Dressing: Supervision/safety;Sitting/lateral leans;Sit to/from stand   Toilet Transfer: Contact guard assist;Supervision/safety;Ambulation;Regular Toilet;Rolling walker (2 wheels) Toilet Transfer Details (indicate cue type and reason): progressing to Supervision, able to stand from toilet without assist, no LOB Toileting- Clothing Manipulation and Hygiene: Supervision/safety;Sit to/from stand;Sitting/lateral lean       Functional mobility during ADLs: Supervision/safety;Contact guard assist;Rolling walker (2 wheels)       Vision Ability to See in Adequate Light: 0 Adequate Patient Visual Report: No change from baseline Vision Assessment?: No apparent visual deficits     Perception         Praxis         Pertinent Vitals/Pain Pain Assessment Pain Assessment: Faces Faces Pain Scale: Hurts a little bit Pain Location: all over (but improved from overnight) Pain Descriptors /  Indicators: Guarding Pain Intervention(s): Monitored during session, Premedicated before session     Extremity/Trunk Assessment Upper Extremity Assessment Upper Extremity Assessment: Generalized weakness;Right hand dominant    Lower Extremity Assessment Lower Extremity Assessment: Defer to PT evaluation   Cervical / Trunk Assessment Cervical / Trunk Assessment: Normal   Communication Communication Communication: No apparent difficulties   Cognition Arousal: Alert Behavior During Therapy: WFL for tasks assessed/performed Cognition: No apparent impairments                               Following commands: Intact       Cueing  General Comments   Cueing Techniques: Verbal cues      Exercises     Shoulder Instructions      Home Living Family/patient expects to be discharged to:: Private residence Living Arrangements: Children Available Help at Discharge: Family;Available PRN/intermittently (daughter who is a engineer, civil (consulting) in [place] system) Type of Home: House Home Access: Stairs to enter [employer] of Steps: 6 Entrance Stairs-Rails: Right;Left Home Layout: Two level;Able to live on main level with bedroom/bathroom     Bathroom Shower/Tub: Producer, Television/film/video: Handicapped height Bathroom Accessibility: Yes   Home Equipment: Rollator (4 wheels);BSC/3in1;Shower seat;Cane - single point;Cane - quad   Additional Comments: daughter lives in full basement apartment, works nights as an CHARITY [employer] for [employer]      Prior Functioning/Environment Prior Level of Function : Needs assist             Mobility Comments: Rollator at home ADLs Comments: Mod I for ADLs aside from calling [facility] aide assist for showering tasks. reports she never showers home alone. Able to heat up meals or make simple meals at home. Daughter assisting with laundry and other tasks as needed    OT Problem List: Decreased activity tolerance   OT Treatment/Interventions:        OT Goals(Current goals can be found in the care plan section)   Acute Rehab OT Goals Patient Stated Goal: home today OT Goal Formulation: All assessment and education complete, DC therapy   OT Frequency:        Co-evaluation              AM-PAC OT 6 Clicks Daily Activity     Outcome Measure Help from another person eating meals?: None Help from another person taking care of personal grooming?: None Help from another person toileting, which includes using toliet, bedpan, or urinal?: A Little Help from another person bathing (including washing, rinsing, drying)?: A Little Help from another person to put on and taking off regular upper body clothing?: A Little Help from another person to put on and taking off regular lower body clothing?: A Little 6 Click Score: 20   End of Session Equipment Utilized During Treatment: Rolling walker (2 wheels) Nurse Communication: Mobility status  Activity Tolerance: Patient tolerated treatment well Patient left: in chair;with chair alarm set;with call bell/phone within reach  OT Visit Diagnosis: Muscle weakness (generalized) (M62.81)                Time: 9271-9254 OT Time Calculation (min): 17 min Charges:  OT General Charges $OT Visit: 1 Visit OT Evaluation $OT Eval Low Complexity: 1 Low  Ebony Scott, OTR/L Acute Rehab Services Office: [phone]   Ebony Scott 12/29/2023, 7:54 AM

## 2023-12-29 NOTE — Assessment & Plan Note (Signed)
 Continue Xarelto

## 2023-12-29 NOTE — Assessment & Plan Note (Signed)
 Continue gabapentin.

## 2023-12-29 NOTE — Plan of Care (Signed)
  Problem: Clinical Measurements: Goal: Diagnostic test results will improve Outcome: Progressing Goal: Respiratory complications will improve Outcome: Progressing Goal: Cardiovascular complication will be avoided Outcome: Progressing   Problem: Activity: Goal: Risk for activity intolerance will decrease Outcome: Progressing   Problem: Elimination: Goal: Will not experience complications related to urinary retention Outcome: Progressing   Problem: Pain Managment: Goal: General experience of comfort will improve and/or be controlled Outcome: Progressing   Problem: Safety: Goal: Ability to remain free from injury will improve Outcome: Progressing

## 2023-12-30 ENCOUNTER — Telehealth: Payer: Self-pay | Admitting: *Deleted

## 2023-12-30 LAB — CULTURE, BLOOD (ROUTINE X 2)
Culture  Setup Time: NONE SEEN
Culture: NO GROWTH
Report Status: NO GROWTH
Special Requests: ADEQUATE
Special Requests: ADEQUATE

## 2023-12-30 NOTE — Transitions of Care (Post Inpatient/ED Visit) (Signed)
   12/30/2023  Name: Ebony Scott MRN: [id number] DOB: Jul 16, 1950  Today's TOC FU Call Status: Today's TOC FU Call Status:: Unsuccessful Call (1st Attempt) Unsuccessful Call (1st Attempt) Date: 12/30/23  Attempted to reach the patient regarding the most recent Inpatient/ED visit.  Follow Up Plan: Additional outreach attempts will be made to reach the patient to complete the Transitions of Care (Post Inpatient/ED visit) call.   Cathlean Headland BSN RN [facility] Health Care Management Coordinator [email or web address] Direct Dial: [phone]  Fax: [phone] Website: [email or web address]

## 2023-12-31 ENCOUNTER — Telehealth: Payer: Self-pay | Admitting: *Deleted

## 2023-12-31 NOTE — Transitions of Care (Post Inpatient/ED Visit) (Signed)
   12/31/2023  Name: Ebony Scott MRN: [id number] DOB: 1950-11-21  Today's TOC FU Call Status: Today's TOC FU Call Status:: Unsuccessful Call (2nd Attempt) Unsuccessful Call (2nd Attempt) Date: 12/31/23  Attempted to reach the patient regarding the most recent Inpatient/ED visit.  Follow Up Plan: Additional outreach attempts will be made to reach the patient to complete the Transitions of Care (Post Inpatient/ED visit) call.    Mliss Creed [facility], BSN RN Care Manager/ Transition of Care [place]/ [facility] [phone]

## 2024-01-01 ENCOUNTER — Telehealth: Payer: Self-pay | Admitting: *Deleted

## 2024-01-01 NOTE — Transitions of Care (Post Inpatient/ED Visit) (Signed)
   01/01/2024  Name: Ebony Scott MRN: [id number] DOB: 31-Jul-1950  Today's TOC FU Call Status: Today's TOC FU Call Status:: Unsuccessful Call (3rd Attempt) Unsuccessful Call (3rd Attempt) Date: 01/01/24  Attempted to reach the patient regarding the most recent Inpatient/ED visit.  Follow Up Plan: No further outreach attempts will be made at this time. We have been unable to contact the patient.  Mliss Creed [facility], BSN RN Care Manager/ Transition of Care [place]/ [facility] [phone]

## 2024-01-03 ENCOUNTER — Encounter: Payer: Self-pay | Admitting: Family Medicine

## 2024-01-03 ENCOUNTER — Ambulatory Visit: Admitting: Family Medicine

## 2024-01-03 VITALS — BP 120/62 | HR 72 | Temp 97.8°F | Ht 60.0 in | Wt 128.4 lb

## 2024-01-03 DIAGNOSIS — J189 Pneumonia, unspecified organism: Secondary | ICD-10-CM

## 2024-01-03 DIAGNOSIS — E1122 Type 2 diabetes mellitus with diabetic chronic kidney disease: Secondary | ICD-10-CM

## 2024-01-03 DIAGNOSIS — N1832 Chronic kidney disease, stage 3b: Secondary | ICD-10-CM

## 2024-01-03 DIAGNOSIS — A419 Sepsis, unspecified organism: Secondary | ICD-10-CM | POA: Diagnosis not present

## 2024-01-03 DIAGNOSIS — M069 Rheumatoid arthritis, unspecified: Secondary | ICD-10-CM | POA: Diagnosis not present

## 2024-01-03 DIAGNOSIS — Z7984 Long term (current) use of oral hypoglycemic drugs: Secondary | ICD-10-CM | POA: Diagnosis not present

## 2024-01-03 MED ORDER — LEVOFLOXACIN 500 MG PO TABS: 500.0000 mg | ORAL_TABLET | Freq: Every day | ORAL | 0 refills | Status: AC

## 2024-01-03 NOTE — Progress Notes (Signed)
 Subjective:    Patient ID: Ebony Scott, female    DOB: Apr 26, 1950, 73 y.o.   MRN: [id number] Admit date:     12/25/2023  Discharge date: 12/29/23  Discharge Physician: Delon Herald    PCP: [name], MD    Recommendations at discharge:    Complete antibiotics - Augmentin twice daily for 5 more days starting 11/10 Follow up with Dr. Maurilio; call for an appointment Follow up with Dr. Duanne in 1-2 weeks   Discharge Diagnoses: Principal Problem:   Sepsis due to pneumonia [facility]) Active Problems:   Hypogammaglobulinemia   Chronic kidney disease, stage 3b (HCC) - baseline Scr 1.3-1.5   Chronic pain syndrome   Rheumatoid arthritis (HCC)   History of pulmonary embolus (PE)   DMII (diabetes mellitus, type 2) (HCC)   DNR (do not resuscitate)   Nocturnal hypoxia - 2-3 L/min   Hyperthyroidism   Acute metabolic encephalopathy   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HCC)    Anemia associated with stage 3 chronic renal failure (HCC)   Aspiration pneumonia (HCC)   CAP (community acquired pneumonia)   CAD S/P percutaneous coronary angioplasty   Vaginal candidiasis     Hospital Course: 73yo with h/o CAD s/p PCI, ischemic cardiomyopathy, T2DM, OSA, DVT, hypogammaglobulinemia, recent Enterococcus faecalis bacteremia s/p AICD removal, and RA who presented on 11/5 with AMS.  She had sepsis physiology on presentation with lactate 2.3, CT c/w aspiration PNA.   Treated with Ceftriaxone  and Azithromycin .   Assessment and Plan:   Assessment & Plan Sepsis due to pneumonia (HCC) Aspiration pneumonia (HCC) SIRS criteria included fever to 102, tachypnea with evidence of acute organ failure including hypotension, elevated lactic acid , and acute encephalopathy Daughter reported that patient probably vomited at home CT chest: Mild consolidation and ground glass disease in the posterior right upper lobe, possible pneumonia or aspiration Continue IV ceftriaxone  and azithromycin  Blood culture:  no growth to date Appears to be stabilizing PT/OT consulted and she did very well, wants to go home today Acute metabolic encephalopathy In the setting of fever, sepsis and aspiration pneumonia This appears to have resolved with treatment of the infection Vaginal candidiasis From antibiotics Given Diflucan  150 mg PO x 1 and topical clotrimazole  Add probiotics 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HCC)  CAD S/P percutaneous coronary angioplasty S/p extraction of ICD in July 2025 due to Enterococcus faecalis bacteremia Last ejection fraction was June 2025 which showed ejection fraction 25 to 30% Entresto  and Jardiance  held on presentation; Entresto  has since been resumed Continue statin, aspirin  and Xarelto   Hypogammaglobulinemia Gets IVIG infusions  She was developing PNA and sepsis with standard IVIG and so was transitioned to hyperimmune IVIG with good result until now Recommended outpatient f/u with Dr. Maurilio post-hospitalization Chronic kidney disease, stage 3b (HCC) - baseline Scr 1.3-1.5 Anemia associated with stage 3 chronic renal failure (HCC) Appears to be stable at this time Attempt to avoid nephrotoxic medications Rheumatoid arthritis (HCC) On chronic prednisone  Received  stress dosed steroids x 2 doses Continue prednisone  History of pulmonary embolus (PE) Continue Xarelto   DMII (diabetes mellitus, type 2) (HCC) A1c 6.5, well controlled Cover with moderate-scale SSI Carb modified diet  Reports neuropathy, needs a refill on gabapentin  because she has not been taking this regularly Nocturnal hypoxia - 2-3 L/min Continue oxygen  supplementation  Also has CPAP at bedtime  Continue Trelegy, Duonebs, Singulair , albuterol  Chronic pain syndrome Continue gabapentin  DNR (do not resuscitate) DNR confirmed at the time of admission  Consultants: PT OT RT   Procedures: None   Antibiotics: Azithromycin  11/6-9 Cefepime  x 1 Ceftriaxone  11/6-9 Vancomycin  x  1 Augmentin 11/10-15 01/03/24 Patient presents today for hospital follow-up.  Since discharge from the hospital, the patient reports she is getting chills at home.  She denies any fever.  She denies any shortness of breath although she is recovering from a right upper lobe pneumonia.  We reviewed the CAT scan findings together.  Today on exam, her right upper lobe sounds clear.  There are no crackles/Rales or rhonchi.  She is moving good air.  However the breath sounds in her left lower lung are markedly diminished.  She also has left basilar crackles that are prominent and consistent even after I have her cough several times.  I am concerned that she may be developing a left lower lobe pneumonia resistant to Augmentin after being in the hospital.  Certainly Pseudomonas is a potential cause.  She denies any aspiration.  She denies any nausea or vomiting.  She is having some diarrhea on Augmentin Past Medical History:  Diagnosis Date   Allergy     Anemia    Angio-edema    Breast cancer (HCC) 1998   Left breast, in remission   Bronchiectasis (HCC)    Cataract    CHF (congestive heart failure) (HCC)    Closed displaced fracture of metatarsal bone of right foot 01/20/2019   Closed fracture of fifth metatarsal bone of right foot, initial encounter 01/15/2018   Clostridium difficile colitis 01/14/2018   Coronary artery disease    Depression    some; don't take anything for it (02/20/2012), pt denies dx as of 06/02/21   Diverticulosis    DVT (deep venous thrombosis) (HCC)    Fibromyalgia 11/2011   Fracture of base of fifth metatarsal bone with routine healing, left    GAVE (gastric antral vascular ectasia)    GERD (gastroesophageal reflux disease)    Headache(784.0)    related to allergies; more at different times during the year (02/20/2012)   Hemorrhoids    Hiatal hernia    back and neck   History of blood transfusion 2023   1 unit, 5 units of Iron    Hx of adenomatous colonic polyps  04/12/2016   Hypercholesteremia    good cholesterol is high   Hypothyroidism    h/o Graves disease   IBS (irritable bowel syndrome)    Moderate persistent asthma    -FeV1 72% 2011, -IgE 102 2011, CT sinus Neg 2011   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 Cindy Setter   Sleep apnea    uses cpap nightly   Tracheobronchomalacia    Vasculitis 12/13/2017   Past Surgical History:  Procedure Laterality Date   ABDOMINAL HYSTERECTOMY N/A    Phreesia 10/21/2019   ANTERIOR AND POSTERIOR REPAIR  1990's   APPENDECTOMY     ARGON LASER APPLICATION  02/05/2021   Procedure: ARGON LASER APPLICATION;  Surgeon: Leigh Elspeth SQUIBB, MD;  Location: [facility];  Service: Gastroenterology;;   BREAST LUMPECTOMY  1998   left   BREAST SURGERY N/A    Phreesia 10/21/2019   BRONCHIAL WASHINGS  04/05/2020   Procedure: BRONCHIAL WASHINGS;  Surgeon: Kara Dorn NOVAK, MD;  Location: [facility];  Service: Pulmonary;;   CAPSULOTOMY Right 08/28/2021   Procedure: CAPSULOTOMY;  Surgeon: Tobie Franky SQUIBB, DPM;  Location: [facility];  Service: Podiatry;  Laterality: Right;   CARPOMETACARPEL (CMC) FUSION OF THUMB WITH AUTOGRAFT FROM RADIUS  ~ 2009   both thumbs (02/20/2012)   CATARACT EXTRACTION W/ INTRAOCULAR LENS  IMPLANT, BILATERAL  2012   CERVICAL DISCECTOMY  10/2001   C5-C6   CERVICAL FUSION  2003   C3-C4   CHOLECYSTECTOMY     COLONOSCOPY     CORONARY STENT INTERVENTION N/A 01/11/2021   Procedure: CORONARY STENT INTERVENTION;  Surgeon: Anner Alm ORN, MD;  Location: [facility] INVASIVE CV LAB;  Service: Cardiovascular;  Laterality: N/A;   DEBRIDEMENT TENNIS ELBOW  ?1970's   right   ESOPHAGOGASTRODUODENOSCOPY     ESOPHAGOGASTRODUODENOSCOPY (EGD) WITH PROPOFOL  N/A 02/05/2021    Procedure: ESOPHAGOGASTRODUODENOSCOPY (EGD) WITH PROPOFOL ;  Surgeon: Leigh Elspeth SQUIBB, MD;  Location: [facility];  Service: Gastroenterology;  Laterality: N/A;   EYE SURGERY N/A    Phreesia 10/21/2019   HAMMER TOE SURGERY Right 08/28/2021   Procedure: HAMMER TOE REPAIR 2ND TOE RIGHT FOOT;  Surgeon: Tobie Franky SQUIBB, DPM;  Location: [facility];  Service: Podiatry;  Laterality: Right;   HYSTERECTOMY     I & D EXTREMITY Right 09/22/2020   Procedure: IRRIGATION AND DEBRIDEMENT RIGHT HAND AND ELBOW;  Surgeon: Josefina Chew, MD;  Location: [facility];  Service: Orthopedics;  Laterality: Right;   ICD GENERATOR REMOVAL N/A 08/21/2023   Procedure: ICD GENERATOR REMOVAL;  Surgeon: Waddell Danelle ORN, MD;  Location: [facility] INVASIVE CV LAB;  Service: Cardiovascular;  Laterality: N/A;   ICD IMPLANT N/A 08/27/2022   Procedure: ICD IMPLANT;  Surgeon: Nancey Eulas BRAVO, MD;  Location: [facility];  Service: Cardiovascular;  Laterality: N/A;   JOINT REPLACEMENT  02/15/2021   KNEE ARTHROPLASTY  ?8009'd   ?right; w/cartilage repair (02/20/2012)   LEAD EXTRACTION N/A 08/21/2023   Procedure: LEAD EXTRACTION;  Surgeon: Waddell Danelle ORN, MD;  [facility]: [facility];  Service: Cardiovascular;  Laterality: N/A;   MASS EXCISION Left 08/28/2021   Procedure: EXCISION OF SOFT TISSUE  MASS LEFT FOOT;  Surgeon: Tobie Franky SQUIBB, DPM;  Location: [facility];  Service: Podiatry;  Laterality: Left;   NASAL SEPTUM SURGERY  1980's   POSTERIOR CERVICAL FUSION/FORAMINOTOMY  2004   failed initial fusion; rewired  anterior neck (02/20/2012)   REVERSE SHOULDER ARTHROPLASTY Right 02/16/2020   Procedure: REVERSE SHOULDER ARTHROPLASTY;  Surgeon: Josefina Chew, MD;  Location: [facility];  Service: Orthopedics;  Laterality: Right;   RIGHT/LEFT HEART CATH AND CORONARY ANGIOGRAPHY N/A 01/11/2021   Procedure: RIGHT/LEFT HEART CATH AND CORONARY ANGIOGRAPHY;  Surgeon: Anner Alm ORN, MD;  Location: [facility] INVASIVE CV LAB;  Service: Cardiovascular;   Laterality: N/A;   SPINE SURGERY N/A    Phreesia 10/21/2019   TONSILLECTOMY  ~ 1953   TRANSESOPHAGEAL ECHOCARDIOGRAM (CATH LAB) N/A 08/12/2023   Procedure: TRANSESOPHAGEAL ECHOCARDIOGRAM;  Surgeon: Pietro Redell RAMAN, MD;  Location: [facility] INVASIVE CV LAB;  Service: Cardiovascular;  Laterality: N/A;   VESICOVAGINAL FISTULA CLOSURE W/ TAH  1988   VIDEO BRONCHOSCOPY Bilateral 08/23/2016   Procedure: VIDEO BRONCHOSCOPY WITH FLUORO;  Surgeon: Noreen Tonnie BRAVO, MD;  Location: [facility];  Service: Cardiopulmonary;  Laterality: Bilateral;   VIDEO BRONCHOSCOPY N/A 04/05/2020   Procedure: VIDEO BRONCHOSCOPY WITHOUT FLUORO;  Surgeon: Kara Dorn NOVAK, MD;  Location: [facility];  Service: Pulmonary;  Laterality: N/A;   Current Outpatient Medications on File Prior to  Visit  Medication Sig Dispense Refill   acetaminophen  (TYLENOL ) 500 MG tablet Take 1 tablet (500 mg total) by mouth every 6 (six) hours as needed for moderate pain (pain score 4-6) or mild pain (pain score 1-3). (Patient taking differently: Take 1,000 mg by mouth daily as needed for mild pain (pain score 1-3) (or headaches).)     albuterol  (PROVENTIL ) (2.5 MG/3ML) 0.083% nebulizer solution Take 3 mLs (2.5 mg total) by nebulization every 4 (four) hours as needed for wheezing or shortness of breath. 150 mL 1   albuterol  (VENTOLIN  HFA) 108 (90 Base) MCG/ACT inhaler Inhale 2 puffs into the lungs every 6 (six) hours as needed for wheezing or shortness of breath. 18 g 1   allopurinol  (ZYLOPRIM ) 100 MG tablet Take 1 tablet (100 mg total) by mouth daily. (Patient taking differently: Take 100 mg by mouth daily with supper.) 90 tablet 0   Alpha-D-Galactosidase (BEANO PO) Take 2 tablets by mouth 3 (three) times daily before meals.     Alpha-Lipoic Acid 600 MG TABS Take 600 mg by mouth in the morning.     amoxicillin-clavulanate (AUGMENTIN) 875-125 MG tablet Take 1 tablet by mouth 2 (two) times daily for 5 days. 10 tablet 0   atorvastatin  (LIPITOR) 80  MG tablet Take 1 tablet (80 mg total) by mouth every evening. 90 tablet 1   Azelastine  HCl 137 MCG/SPRAY SOLN 2 sprays once per day (Patient taking differently: Place 1 spray into both nostrils 2 (two) times daily as needed (for allergies or rhinitis).) 30 mL 2   benzonatate  (TESSALON ) 100 MG capsule Take 100 mg by mouth 3 (three) times daily as needed for cough.     Calcium  Citrate (CITRACAL PO) Take 1 tablet by mouth See admin instructions. Citracal + Magnesium  600 mg  - Take 1 tablet by mouth at bedtime     cetirizine  (ZYRTEC ) 10 MG tablet TAKE 1 TABLET (10 MG TOTAL) BY MOUTH DAILY AS NEEDED FOR ALLERGIES (CAN TAKE A N EXTRA DOSE DURING FLARE UPS.). (Patient taking differently: Take 10 mg by mouth at bedtime.) 180 tablet 1   Cholecalciferol  (VITAMIN D3) 25 MCG (1000 UT) capsule Take 1,000 Units by mouth at bedtime.     dexlansoprazole  (DEXILANT ) 60 MG capsule TAKE 1 CAPSULE (60 MG TOTAL) BY MOUTH IN THE MORNING 90 capsule 1   diclofenac  Sodium (VOLTAREN ) 1 % GEL Apply 2 g topically 4 (four) times daily as needed (for pain).     empagliflozin  (JARDIANCE ) 10 MG TABS tablet Take 1 tablet (10 mg total) by mouth daily. 90 tablet 1   ENTRESTO  24-26 MG TAKE 1 TABLET BY MOUTH TWICE A DAY 180 tablet 0   EPINEPHRINE  0.3 mg/0.3 mL IJ SOAJ injection USE AS DIRECTED BY YOUR PHYSICIAN INTRAMUSCULARLY AS NEEDED FOR ANAPHYLAXIS 2 each 1   famotidine  (PEPCID ) 20 MG tablet TAKE 1 TABLET (20 MG) BY MOUTH DAILY BEFORE LUNCH AND1 TABLET BY MOUTH EACH NIGHT AT BEDTIME (Patient taking differently: Take 20 mg by mouth at bedtime.) 180 tablet 1   fluticasone  (FLONASE ) 50 MCG/ACT nasal spray Place 2 sprays into both nostrils daily. (Patient taking differently: Place 2 sprays into both nostrils daily as needed for allergies or rhinitis.) 16 g 6   Fluticasone -Umeclidin-Vilant (TRELEGY ELLIPTA ) 200-62.5-25 MCG/ACT AEPB Inhale 1 Inhalation into the lungs in the morning. (Patient taking differently: Inhale 2 puffs into the lungs  at bedtime.) 120 each 3   folic acid  (FOLVITE ) 1 MG tablet Take 1 tablet (1 mg total) by mouth  daily. 90 tablet 3   furosemide  (LASIX ) 40 MG tablet Take 0.5 tablets (20 mg total) by mouth daily as needed for fluid or edema (Weight gain, edema >3lbs). Monday and Thursday (Patient taking differently: Take 20 mg by mouth daily as needed for fluid or edema (Weight gain, edema >3 pounds as directed).)     gabapentin  (NEURONTIN ) 300 MG capsule Take 1 capsule (300 mg total) by mouth at bedtime. 30 capsule 0   guaiFENesin  (MUCINEX ) 600 MG 12 hr tablet Take 1,200 mg by mouth in the morning.     Immune Globulin , Human,-slra (ASCENIV) 5 GM/50ML SOLN Inject 50 g into the vein every 28 (twenty-eight) days.     ipratropium-albuterol  (DUONEB) 0.5-2.5 (3) MG/3ML SOLN Take 3 mLs by nebulization every 6 (six) hours as needed. (Patient taking differently: Take 3 mLs by nebulization every 6 (six) hours as needed (for shortness of breath or wheezing).) 360 mL 6   Lactase (LACTOSE FAST ACTING RELIEF PO) Take 2 tablets by mouth 3 (three) times daily before meals.     magnesium  oxide (MAG-OX) 400 (240 Mg) MG tablet Take 400 mg by mouth at bedtime.     montelukast  (SINGULAIR ) 10 MG tablet Take 1 tablet (10 mg total) by mouth daily after supper. 90 tablet 1   mupirocin  ointment (BACTROBAN ) 2 % Apply 1 Application topically as needed (wounds). 30 g 3   nystatin  (MYCOSTATIN ) 100000 UNIT/ML suspension Swish, gargle, and swallow 5 ml after Trelegy use (Patient taking differently: Use as directed 5 mLs in the mouth or throat See admin instructions. Swish, gargle, and swallow 5 ml's after Trelegy use) 473 mL 0   ondansetron  (ZOFRAN ) 4 MG tablet Take 1 tablet (4 mg total) by mouth daily as needed for nausea or vomiting. 30 tablet 0   OXYGEN  Inhale 2 L/min into the lungs at bedtime.     potassium chloride  (KLOR-CON ) 10 MEQ tablet Take 10 mEq by mouth See admin instructions. Take 10 mEq by mouth once a day only when taking Lasix       predniSONE  (DELTASONE ) 5 MG tablet Take 1 tablet (5 mg total) by mouth daily with breakfast. 90 tablet 0   Prenatal Vit-Fe Fumarate-FA (PRENATAL MULTIVITAMIN) TABS tablet Take 1 tablet by mouth daily at 12 noon.     PRESCRIPTION MEDICATION CPAP- At bedtime     Probiotic Product (ALIGN) 4 MG CAPS Take 4 mg by mouth every evening.     Respiratory Therapy Supplies (NEBULIZER MASK ADULT) MISC 1 Device by Does not apply route as directed. 1 each 1   rivaroxaban  (XARELTO ) 20 MG TABS tablet Take 1 tablet (20 mg total) by mouth daily with supper.     traZODone  (DESYREL ) 50 MG tablet Take 50 mg by mouth at bedtime as needed for sleep.     Current Facility-Administered Medications on File Prior to Visit  Medication Dose Route Frequency Provider Last Rate Last Admin   denosumab  (PROLIA ) injection 60 mg  60 mg Subcutaneous Once Miachel Nardelli T, MD       Allergies  Allergen Reactions   Baclofen  Anaphylaxis and Other (See Comments)    Altered mental status requiring a 3-day hospital stay- patient became unresponsive   Put me in a coma for five days, Altered mental status requiring a 3-day hospital stay- patient became unresponsive   Other Shortness Of Breath and Other (See Comments)    Grass and weeds sneezing; filled sinuses (02/20/2012)   Porcine (Pork) Protein-Containing Drug Products Anaphylaxis   Shellfish Allergy   Anaphylaxis, Shortness Of Breath, Itching and Rash   Shrimp Extract Anaphylaxis and Other (See Comments)    ALL SHELLFISH   Tetracycline Hcl Nausea And Vomiting   Xolair  [Omalizumab ] Other (See Comments)    Caused Blood clot   Zoledronic  Acid Other (See Comments)    Reclast - Fever, Put in hospital, dr said it was a reaction from a reaction    Fever, inability to walk, Put in hospital  Fever, Put in hospital, dr said it was a reaction from a reaction , Other reaction(s): Unknown   Celecoxib Swelling and Other (See Comments)    Feet swelling   Dilaudid  [Hydromorphone  Hcl]  Itching   Hydrocodone -Acetaminophen  Itching   Levofloxacin  Other (See Comments)    GI upset   Molds & Smuts Cough   Morphine  And Codeine Hives and Itching   Oxycodone  Hcl Nausea And Vomiting and Other (See Comments)    GI Intolerance   Paroxetine Nausea And Vomiting and Other (See Comments)    GI Intolerance also   Tetracycline Hcl Other (See Comments)    GI Intolerance   Oxycodone -Acetaminophen  Itching   Diltiazem  Hcl Swelling   Dust Mite Extract Other (See Comments) and Cough    sneezing (02/20/2012) too   Gamunex [Immune Globulin ] Itching, Rash and Other (See Comments)    Tolerates hizentra     Rituximab  Other (See Comments)    Anemia    Rofecoxib Swelling    Vioxx- feet swelling   Tree Extract Itching    tested and told I was allergic to it; never experienced a reaction to it (02/20/2012)   Social History   Socioeconomic History   Marital status: Divorced    Spouse name: Not on file   Number of children: 2   Years of education: college   Highest education level: Bachelor's degree (e.g., BA, AB, BS)  Occupational History   Occupation: Disabled    Comment: Retired Dispensing Optician: RETIRED  Tobacco Use   Smoking status: Never    Passive exposure: Past   Smokeless tobacco: Never   Tobacco comments:    Parents  Vaping Use   Vaping status: Never Used  Substance and Sexual Activity   Alcohol  use: Not Currently    Alcohol /week: 0.0 standard drinks of alcohol    Drug use: No   Sexual activity: Not Currently    Birth control/protection: Surgical    Comment: Hysterectomy  Other Topics Concern   Not on file  Social History Narrative   Patient lives at home alone. Patient  divorced.    Patient has her BS degree.   Right handed.   Caffeine- sometimes coffee.      [facility]:   Born in [place], [place]. She worked as a armed forces operational officer. She has no pets currently. She does have indoor plants. Previously had mold in her home that was  remediated. Carpet was removed.          Social Drivers of Corporate Investment Banker Strain: Low Risk  (11/28/2023)   Overall Financial Resource Strain (CARDIA)    Difficulty of Paying Living Expenses: Not very hard  Food Insecurity: No Food Insecurity (12/29/2023)   Hunger Vital Sign    Worried About Running Out of Food in the Last Year: Never true    Ran Out of Food in the Last Year: Never true  Transportation Needs: No Transportation Needs (12/29/2023)   PRAPARE - Administrator, Civil Service (Medical): No  Lack of Transportation (Non-Medical): No  Physical Activity: Inactive (11/28/2023)   Exercise Vital Sign    Days of Exercise per Week: 0 days    Minutes of Exercise per Session: 30 min  Stress: Stress Concern Present (11/28/2023)   [employer] of Occupational Health - Occupational Stress Questionnaire    Feeling of Stress: To some extent  Social Connections: Socially Isolated (12/25/2023)   Social Connection and Isolation Panel    Frequency of Communication with Friends and Family: Never    Frequency of Social Gatherings with Friends and Family: Never    Attends Religious Services: Never    Database Administrator or Organizations: No    Attends Banker Meetings: Never    Marital Status: Divorced  Catering Manager Violence: Not At Risk (12/29/2023)   Humiliation, Afraid, Rape, and Kick questionnaire    Fear of Current or Ex-Partner: No    Emotionally Abused: No    Physically Abused: No    Sexually Abused: No     Review of Systems     Objective:   Physical Exam Vitals reviewed.  Constitutional:      General: She is not in acute distress.    Appearance: Normal appearance. She is not ill-appearing or toxic-appearing.  HENT:     Nose: No congestion or rhinorrhea.  Cardiovascular:     Rate and Rhythm: Normal rate and regular rhythm.     Heart sounds: Normal heart sounds. No murmur heard. Pulmonary:     Effort: Pulmonary effort is  normal. No respiratory distress.     Breath sounds: No stridor or decreased air movement. Examination of the left-middle field reveals decreased breath sounds and rales. Examination of the left-lower field reveals decreased breath sounds and rales. Decreased breath sounds and rales present. No wheezing or rhonchi.  Abdominal:     General: Bowel sounds are normal. There is no distension.     Palpations: Abdomen is soft.     Tenderness: There is no abdominal tenderness. There is no guarding.  Musculoskeletal:     Right lower leg: No edema.     Left lower leg: No edema.  Skin:    Findings: No erythema.  Neurological:     Mental Status: She is alert and oriented to person, place, and time.     Cranial Nerves: No cranial nerve deficit.     Sensory: No sensory deficit.     Motor: No weakness.     Coordination: Coordination normal.        Assessment & Plan:  Pneumonia of left lower lobe due to infectious organism - Plan: CBC with Differential/Platelet, Comprehensive metabolic panel with GFR Given the persistent chills and cough, and her abnormal findings on lung exam, I am concerned that the patient may be dealing with a left lower lobe pneumonia.  Given the fact that it occurred while in the hospital despite taking Augmentin I am concerned about possible Pseudomonas.  I recommended checking a CBC a CMP and starting Levaquin  500 mg daily for 7 days.  Recheck next week or seek medical attention immediately if worsening.

## 2024-01-04 LAB — CBC WITH DIFFERENTIAL/PLATELET
Absolute Lymphocytes: 1187 {cells}/uL (ref 850–3900)
Absolute Monocytes: 859 {cells}/uL (ref 200–950)
Basophils Absolute: 53 {cells}/uL (ref 0–200)
Basophils Relative: 0.5 %
Eosinophils Absolute: 106 {cells}/uL (ref 15–500)
Eosinophils Relative: 1 %
HCT: 33.6 % — ABNORMAL LOW (ref 35.0–45.0)
Hemoglobin: 10.9 g/dL — ABNORMAL LOW (ref 11.7–15.5)
MCH: 34.4 pg — ABNORMAL HIGH (ref 27.0–33.0)
MCHC: 32.4 g/dL (ref 32.0–36.0)
MCV: 106 fL — ABNORMAL HIGH (ref 80.0–100.0)
MPV: 12.1 fL (ref 7.5–12.5)
Monocytes Relative: 8.1 %
Neutro Abs: 8395 {cells}/uL — ABNORMAL HIGH (ref 1500–7800)
Neutrophils Relative %: 79.2 %
Platelets: 264 Thousand/uL (ref 140–400)
RBC: 3.17 Million/uL — ABNORMAL LOW (ref 3.80–5.10)
RDW: 16.4 % — ABNORMAL HIGH (ref 11.0–15.0)
Total Lymphocyte: 11.2 %
WBC: 10.6 Thousand/uL (ref 3.8–10.8)

## 2024-01-04 LAB — COMPREHENSIVE METABOLIC PANEL WITH GFR
AG Ratio: 1.2 (calc) (ref 1.0–2.5)
ALT: 28 U/L (ref 6–29)
AST: 30 U/L (ref 10–35)
Albumin: 3.8 g/dL (ref 3.6–5.1)
Alkaline phosphatase (APISO): 47 U/L (ref 37–153)
BUN/Creatinine Ratio: 18 (calc) (ref 6–22)
BUN: 23 mg/dL (ref 7–25)
CO2: 23 mmol/L (ref 20–32)
Calcium: 9.2 mg/dL (ref 8.6–10.4)
Chloride: 107 mmol/L (ref 98–110)
Creat: 1.31 mg/dL — ABNORMAL HIGH (ref 0.60–1.00)
Globulin: 3.3 g/dL (ref 1.9–3.7)
Glucose, Bld: 163 mg/dL — ABNORMAL HIGH (ref 65–99)
Potassium: 5 mmol/L (ref 3.5–5.3)
Sodium: 140 mmol/L (ref 135–146)
Total Bilirubin: 0.5 mg/dL (ref 0.2–1.2)
Total Protein: 7.1 g/dL (ref 6.1–8.1)
eGFR: 42 mL/min/1.73m2 — ABNORMAL LOW (ref >=60)

## 2024-01-06 ENCOUNTER — Ambulatory Visit: Payer: Self-pay | Admitting: Family Medicine

## 2024-01-14 ENCOUNTER — Other Ambulatory Visit: Payer: Self-pay

## 2024-01-14 ENCOUNTER — Telehealth: Payer: Self-pay | Admitting: *Deleted

## 2024-01-14 ENCOUNTER — Encounter: Payer: Self-pay | Admitting: Allergy and Immunology

## 2024-01-14 ENCOUNTER — Ambulatory Visit: Admitting: Allergy and Immunology

## 2024-01-14 VITALS — BP 98/52 | HR 74 | Temp 98.2°F | Resp 16

## 2024-01-14 DIAGNOSIS — G4733 Obstructive sleep apnea (adult) (pediatric): Secondary | ICD-10-CM | POA: Diagnosis not present

## 2024-01-14 DIAGNOSIS — J189 Pneumonia, unspecified organism: Secondary | ICD-10-CM | POA: Diagnosis not present

## 2024-01-14 DIAGNOSIS — D801 Nonfamilial hypogammaglobulinemia: Secondary | ICD-10-CM

## 2024-01-14 MED ORDER — EPINEPHRINE 0.3 MG/0.3ML IJ SOAJ: INTRAMUSCULAR | 1 refills | Status: AC

## 2024-01-14 NOTE — Telephone Encounter (Signed)
 Patient was seen in office today with Dr. Kozlow and he ordered an overnight oximetry for her to have done with her CPAP and oxygen . Her CPAP and oxygen  come from [facility], I attempted to call them at [phone] to advise of the order and see what further steps needed to be taken, but since it was after hours the only line available was the emergency line. He also ordered a modified barium swallow to be performed for concerns of aspiration. I have placed this order with [facility], unfortunately it is too late to call their rehabilitation department to look into getting that scheduled with speech language pathology. Can someone please assist with calling [facility] and [facility] to help get these set up for the patient please?

## 2024-01-14 NOTE — Patient Instructions (Addendum)
  1. Complete immunoglobulin infusion - IVIG Asceniv   2. Check IgA/G/M    3. Obtain modified barium swallow for aspiration  4. Obtain nocturnal oximetry study while using CPAP + O2  5. Return to clinic in 12 weeks or earlier if problem  6. Influenza = Tamiflu . Covid = Paxlovid

## 2024-01-14 NOTE — Progress Notes (Unsigned)
 [place] [facility] [place] [facility] [place] [facility] [place] [facility] [place]   Follow-up Note  Referring Provider: Barbarann Nest, MD Primary Provider: [name], MD Date of Office Visit: 01/14/2024  Subjective:   Ebony Scott (DOB: 26-Jul-1950) is a 73 y.o. 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  I last saw her in this clinic 03 December 2023.  She required another hospitalization in November for a pneumonia and survived that event with appropriate antibiotic therapy.  She has had 3 infusions of her IVIG and she has not had a serum antibody level checked today.  Concerning her pneumonia, she thinks that she may have aspirated.  It has been a long time since she has had any type of swallowing study performed.  A barium swallow performed 15 February 2015 identified incomplete epiglottic inversion during swallowing, with laryngeal penetration of thick liquid to the level of the vocal cords.  She is very fatigued throughout the day.  She is using CPAP with oxygen  supplementation but she has not had a nocturnal oximetry study checked in many years while utilizing this device.  Allergies as of 01/14/2024       Reactions   Baclofen  Anaphylaxis, Other (See Comments)   Altered mental status requiring a 3-day hospital stay- patient became unresponsive Put me in a coma for five days, Altered mental status requiring a 3-day hospital stay- patient became unresponsive   Other Shortness Of Breath, Other (See Comments)   Grass and weeds sneezing; filled sinuses (02/20/2012)   Porcine (pork)  Protein-containing Drug Products Anaphylaxis   Shellfish Allergy  Anaphylaxis, Shortness Of Breath, Itching, Rash   Shrimp Extract Anaphylaxis, Other (See Comments)   ALL SHELLFISH   Tetracycline Hcl Nausea And Vomiting   Xolair  [omalizumab ] Other (See Comments)   Caused Blood clot   Zoledronic  Acid Other (See Comments)   Reclast - Fever, Put in hospital, dr said it was a reaction from a reaction  Fever, inability to walk, Put in hospital Fever, Put in hospital, dr said it was a reaction from a reaction , Other reaction(s): Unknown   Celecoxib Swelling, Other (See Comments)   Feet swelling   Dilaudid  [hydromorphone  Hcl] Itching   Hydrocodone -acetaminophen  Itching   Levofloxacin  Other (See Comments)   GI upset   Molds & Smuts Cough   Morphine  And Codeine Hives, Itching   Oxycodone  Hcl Nausea And Vomiting, Other (See Comments)   GI Intolerance   Paroxetine Nausea And Vomiting, Other (See Comments)   GI Intolerance also   Tetracycline Hcl Other (See Comments)   GI Intolerance   Oxycodone -acetaminophen  Itching   Diltiazem  Hcl Swelling   Dust Mite Extract Other (See Comments), Cough   sneezing (02/20/2012) too   Gamunex [immune Globulin ] Itching, Rash, Other (See Comments)   Tolerates hizentra     Rituximab  Other (See Comments)   Anemia    Rofecoxib Swelling   Vioxx- feet swelling   Tree Extract Itching   tested and told I was allergic to it; never experienced a reaction to it (02/20/2012)        Medication List    acetaminophen  500 MG tablet Commonly known as: TYLENOL  Take 1 tablet (500 mg total) by  mouth every 6 (six) hours as needed for moderate pain (pain score 4-6) or mild pain (pain score 1-3).   albuterol  108 (90 Base) MCG/ACT inhaler Commonly known as: VENTOLIN  HFA Inhale 2 puffs into the lungs every 6 (six) hours as needed for wheezing or shortness of breath.   albuterol  (2.5 MG/3ML) 0.083% nebulizer solution Commonly known as: PROVENTIL  Take 3 mLs (2.5 mg  total) by nebulization every 4 (four) hours as needed for wheezing or shortness of breath.   Align 4 MG Caps Take 4 mg by mouth every evening.   allopurinol  100 MG tablet Commonly known as: ZYLOPRIM  Take 1 tablet (100 mg total) by mouth daily.   Alpha-Lipoic Acid 600 MG Tabs Take 600 mg by mouth in the morning.   Asceniv 5 GM/50ML Soln Generic drug: Immune Globulin  (Human)-slra Inject 50 g into the vein every 28 (twenty-eight) days.   atorvastatin  80 MG tablet Commonly known as: LIPITOR Take 1 tablet (80 mg total) by mouth every evening.   Azelastine  HCl 137 MCG/SPRAY Soln 2 sprays once per day   BEANO PO Take 2 tablets by mouth 3 (three) times daily before meals.   benzonatate  100 MG capsule Commonly known as: TESSALON  Take 100 mg by mouth 3 (three) times daily as needed for cough.   cetirizine  10 MG tablet Commonly known as: ZYRTEC  TAKE 1 TABLET (10 MG TOTAL) BY MOUTH DAILY AS NEEDED FOR ALLERGIES (CAN TAKE A N EXTRA DOSE DURING FLARE UPS.).   CITRACAL PO Take 1 tablet by mouth See admin instructions. Citracal + Magnesium  600 mg  - Take 1 tablet by mouth at bedtime   dexlansoprazole  60 MG capsule Commonly known as: DEXILANT  TAKE 1 CAPSULE (60 MG TOTAL) BY MOUTH IN THE MORNING   empagliflozin  10 MG Tabs tablet Commonly known as: Jardiance  Take 1 tablet (10 mg total) by mouth daily.   Entresto  24-26 MG Generic drug: sacubitril -valsartan  TAKE 1 TABLET BY MOUTH TWICE A DAY   EPINEPHrine  0.3 mg/0.3 mL Soaj injection Commonly known as: EPI-PEN USE AS DIRECTED BY YOUR PHYSICIAN INTRAMUSCULARLY AS NEEDED FOR ANAPHYLAXIS   famotidine  20 MG tablet Commonly known as: PEPCID  TAKE 1 TABLET (20 MG) BY MOUTH DAILY BEFORE LUNCH AND1 TABLET BY MOUTH EACH NIGHT AT BEDTIME   fluticasone  50 MCG/ACT nasal spray Commonly known as: FLONASE  Place 2 sprays into both nostrils daily.   folic acid  1 MG tablet Commonly known as: FOLVITE  Take 1 tablet (1 mg total) by mouth daily.    furosemide  40 MG tablet Commonly known as: LASIX  Take 0.5 tablets (20 mg total) by mouth daily as needed for fluid or edema (Weight gain, edema >3lbs). Monday and Thursday   gabapentin  300 MG capsule Commonly known as: NEURONTIN  Take 1 capsule (300 mg total) by mouth at bedtime.   guaiFENesin  600 MG 12 hr tablet Commonly known as: MUCINEX  Take 1,200 mg by mouth in the morning.   ipratropium-albuterol  0.5-2.5 (3) MG/3ML Soln Commonly known as: DUONEB Take 3 mLs by nebulization every 6 (six) hours as needed.   LACTOSE FAST ACTING RELIEF PO Take 2 tablets by mouth 3 (three) times daily before meals.   magnesium  oxide 400 (240 Mg) MG tablet Commonly known as: MAG-OX Take 400 mg by mouth at bedtime.   mNexspike 10 MCG/0.2ML Susy Generic drug: COVID-19 mRNA Vacc (Moderna)   montelukast  10 MG tablet Commonly known as: SINGULAIR  Take 1 tablet (10 mg total) by mouth daily after supper.   mupirocin  ointment 2 % Commonly known as:  BACTROBAN  Apply 1 Application topically as needed (wounds).   Nebulizer Mask Adult Misc 1 Device by Does not apply route as directed.   nystatin  100000 UNIT/ML suspension Commonly known as: MYCOSTATIN  Swish, gargle, and swallow 5 ml after Trelegy use   ondansetron  4 MG tablet Commonly known as: Zofran  Take 1 tablet (4 mg total) by mouth daily as needed for nausea or vomiting.   OXYGEN  Inhale 2 L/min into the lungs at bedtime.   potassium chloride  10 MEQ tablet Commonly known as: KLOR-CON  Take 10 mEq by mouth See admin instructions. Take 10 mEq by mouth once a day only when taking Lasix    predniSONE  5 MG tablet Commonly known as: DELTASONE  Take 1 tablet (5 mg total) by mouth daily with breakfast.   prenatal multivitamin Tabs tablet Take 1 tablet by mouth daily at 12 noon.   PRESCRIPTION MEDICATION CPAP- At bedtime   rivaroxaban  20 MG Tabs tablet Commonly known as: Xarelto  Take 1 tablet (20 mg total) by mouth daily with supper.    traZODone  50 MG tablet Commonly known as: DESYREL  Take 50 mg by mouth at bedtime as needed for sleep.   Trelegy Ellipta  200-62.5-25 MCG/ACT Aepb Generic drug: Fluticasone -Umeclidin-Vilant Inhale 1 Inhalation into the lungs in the morning.   Vitamin D3 25 MCG (1000 UT) Caps Take 1,000 Units by mouth at bedtime.   Voltaren  1 % Gel Generic drug: diclofenac  Sodium Apply 2 g topically 4 (four) times daily as needed (for pain).    Past Medical History:  Diagnosis Date   Allergy     Anemia    Angio-edema    Breast cancer (HCC) 1998   Left breast, in remission   Bronchiectasis (HCC)    Cataract    CHF (congestive heart failure) (HCC)    Closed displaced fracture of metatarsal bone of right foot 01/20/2019   Closed fracture of fifth metatarsal bone of right foot, initial encounter 01/15/2018   Clostridium difficile colitis 01/14/2018   Coronary artery disease    Depression    some; don't take anything for it (02/20/2012), pt denies dx as of 06/02/21   Diverticulosis    DVT (deep venous thrombosis) (HCC)    Fibromyalgia 11/2011   Fracture of base of fifth metatarsal bone with routine healing, left    GAVE (gastric antral vascular ectasia)    GERD (gastroesophageal reflux disease)    Headache(784.0)    related to allergies; more at different times during the year (02/20/2012)   Hemorrhoids    Hiatal hernia    back and neck   History of blood transfusion 2023   1 unit, 5 units of Iron    Hx of adenomatous colonic polyps 04/12/2016   Hypercholesteremia    good cholesterol is high   Hypothyroidism    h/o Graves disease   IBS (irritable bowel syndrome)    Moderate persistent asthma    -FeV1 72% 2011, -IgE 102 2011, CT sinus Neg 2011   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 Cindy Setter   Sleep apnea    uses cpap nightly   Tracheobronchomalacia    Vasculitis 12/13/2017    Past Surgical History:  Procedure Laterality Date   ABDOMINAL HYSTERECTOMY N/A    Phreesia 10/21/2019  ANTERIOR AND POSTERIOR REPAIR  1990's   APPENDECTOMY     ARGON LASER APPLICATION  02/05/2021   Procedure: ARGON LASER APPLICATION;  Surgeon: Leigh Elspeth SQUIBB, MD;  Location: [facility];  Service: Gastroenterology;;   BREAST LUMPECTOMY  1998   left   BREAST SURGERY N/A    Phreesia 10/21/2019   BRONCHIAL WASHINGS  04/05/2020   Procedure: BRONCHIAL WASHINGS;  Surgeon: Kara Dorn NOVAK, MD;  Location: [facility];  Service: Pulmonary;;   CAPSULOTOMY Right 08/28/2021   Procedure: CAPSULOTOMY;  Surgeon: Tobie Franky SQUIBB, DPM;  Location: [facility];  Service: Podiatry;  Laterality: Right;   CARPOMETACARPEL (CMC) FUSION OF THUMB WITH AUTOGRAFT FROM RADIUS  ~ 2009   both thumbs (02/20/2012)   CATARACT EXTRACTION W/ INTRAOCULAR LENS  IMPLANT, BILATERAL  2012   CERVICAL DISCECTOMY  10/2001   C5-C6   CERVICAL FUSION  2003   C3-C4   CHOLECYSTECTOMY     COLONOSCOPY     CORONARY STENT INTERVENTION N/A 01/11/2021   Procedure: CORONARY STENT INTERVENTION;  Surgeon: Anner Alm ORN, MD;  Location: [facility] INVASIVE CV LAB;  Service: Cardiovascular;  Laterality: N/A;   DEBRIDEMENT TENNIS ELBOW  ?1970's   right   ESOPHAGOGASTRODUODENOSCOPY     ESOPHAGOGASTRODUODENOSCOPY (EGD) WITH PROPOFOL  N/A 02/05/2021   Procedure: ESOPHAGOGASTRODUODENOSCOPY (EGD) WITH PROPOFOL ;  Surgeon: Leigh Elspeth SQUIBB, MD;  [facility];  Service: Gastroenterology;  Laterality: N/A;   EYE SURGERY N/A    Phreesia 10/21/2019   HAMMER TOE SURGERY Right 08/28/2021   Procedure: HAMMER TOE REPAIR 2ND TOE RIGHT FOOT;  Surgeon: Tobie Franky SQUIBB, DPM;  [facility];  Service: Podiatry;  Laterality: Right;   HYSTERECTOMY     I & D EXTREMITY Right 09/22/2020   Procedure: IRRIGATION  AND DEBRIDEMENT RIGHT HAND AND ELBOW;  Surgeon: Josefina Chew, MD;  Location: [facility];  Service: Orthopedics;  Laterality: Right;   ICD GENERATOR REMOVAL N/A 08/21/2023   Procedure: ICD GENERATOR REMOVAL;  Surgeon: Waddell Danelle ORN, MD;  Location[facility] INVASIVE CV LAB;  Service: Cardiovascular;  Laterality: N/A;   ICD IMPLANT N/A 08/27/2022   Procedure: ICD IMPLANT;  Surgeon: Nancey Eulas BRAVO, MD;  [facility]: [facility];  Service: Cardiovascular;  Laterality: N/A;   JOINT REPLACEMENT  02/15/2021   KNEE ARTHROPLASTY  ?8009'd   ?right; w/cartilage repair (02/20/2012)   LEAD EXTRACTION N/A 08/21/2023   Procedure: LEAD EXTRACTION;  Surgeon: Waddell Danelle ORN, MD;  [facility];  Service: Cardiovascular;  Laterality: N/A;   MASS EXCISION Left 08/28/2021   Procedure: EXCISION OF SOFT TISSUE  MASS LEFT FOOT;  Surgeon: Tobie Franky SQUIBB, DPM;  Location[facility];  Service: Podiatry;  Laterality: Left;   NASAL SEPTUM SURGERY  1980's   POSTERIOR CERVICAL FUSION/FORAMINOTOMY  2004   failed initial fusion; rewired  anterior neck (02/20/2012)   REVERSE SHOULDER ARTHROPLASTY Right 02/16/2020   Procedure: REVERSE SHOULDER ARTHROPLASTY;  Surgeon: Josefina Chew, MD;  [facility];  Service: Orthopedics;  Laterality: Right;   RIGHT/LEFT HEART CATH AND CORONARY ANGIOGRAPHY N/A 01/11/2021   Procedure: RIGHT/LEFT HEART CATH AND CORONARY ANGIOGRAPHY;  Surgeon: Anner Alm ORN, MD;  Location: [facility] INVASIVE CV LAB;  Service: Cardiovascular;  Laterality: N/A;   SPINE SURGERY N/A    Phreesia 10/21/2019   TONSILLECTOMY  ~ 1953   TRANSESOPHAGEAL ECHOCARDIOGRAM (CATH LAB) N/A 08/12/2023   Procedure: TRANSESOPHAGEAL ECHOCARDIOGRAM;  Surgeon: Pietro Redell RAMAN, MD;  Location[facility] INVASIVE CV LAB;  Service: Cardiovascular;  Laterality: N/A;  VESICOVAGINAL FISTULA CLOSURE W/ TAH  1988   VIDEO BRONCHOSCOPY Bilateral 08/23/2016   Procedure: VIDEO BRONCHOSCOPY WITH FLUORO;  Surgeon: Noreen Tonnie BRAVO, MD;   [facility];  Service: Cardiopulmonary;  Laterality: Bilateral;   VIDEO BRONCHOSCOPY N/A 04/05/2020   Procedure: VIDEO BRONCHOSCOPY WITHOUT FLUORO;  Surgeon: Kara Dorn NOVAK, MD;  [facility];  Service: Pulmonary;  Laterality: N/A;    Review of systems negative except as noted in HPI / PMHx or noted below:  Review of Systems  Constitutional: Negative.   HENT: Negative.    Eyes: Negative.   Respiratory: Negative.    Cardiovascular: Negative.   Gastrointestinal: Negative.   Genitourinary: Negative.   Musculoskeletal: Negative.   Skin: Negative.   Neurological: Negative.   Endo/Heme/Allergies: Negative.   Psychiatric/Behavioral: Negative.       Objective:   Vitals:   01/14/24 1458  BP: (!) 100/46  Pulse: 74  Resp: 16  Temp: 98.2 F (36.8 C)  SpO2: 94%          Physical Exam Constitutional:      Appearance: She is not diaphoretic.  HENT:     Head: Normocephalic.     Right Ear: Tympanic membrane, ear canal and external ear normal.     Left Ear: Tympanic membrane, ear canal and external ear normal.     Nose: Nose normal. No mucosal edema or rhinorrhea.     Mouth/Throat:     Pharynx: Uvula midline. No oropharyngeal exudate.  Eyes:     Conjunctiva/sclera: Conjunctivae normal.  Neck:     Thyroid : No thyromegaly.     Trachea: Trachea normal. No tracheal tenderness or tracheal deviation.  Cardiovascular:     Rate and Rhythm: Normal rate and regular rhythm.     Heart sounds: Normal heart sounds, S1 normal and S2 normal. No murmur heard. Pulmonary:     Effort: No respiratory distress.     Breath sounds: Normal breath sounds. No stridor. No wheezing or rales.  Lymphadenopathy:     Head:     Right side of head: No tonsillar adenopathy.     Left side of head: No tonsillar adenopathy.     Cervical: No cervical adenopathy.  Skin:    Findings: No erythema or rash.     Nails: There is no clubbing.  Neurological:     Mental Status: She is alert.      Diagnostics:    Spirometry was performed and demonstrated an FEV1 of *** at *** % of predicted.  The patient had an Asthma Control Test with the following results:  .    Results of a chest CT scan obtained 25 December 2023 identifies the following:  Cardiovascular: Moderate aortic atherosclerosis. No aneurysm. Multi vessel coronary vascular calcification. Normal cardiac size. No pericardial effusion   Mediastinum/Nodes: Patent trachea. No thyroid  mass. No suspicious lymph nodes. Esophagus within normal limits.   Lungs/Pleura: No pleural effusion or pneumothorax. Bandlike scarring in the lower lobes. Mild consolidation and ground-glass disease in the posterior right upper lobe.  Assessment and Plan:   1. Hypogammaglobulinemia   2. Recurrent pneumonia   3. Other neonatal aspiration with respiratory symptoms   4. Obstructive sleep apnea syndrome    1. Complete immunoglobulin infusion - IVIG Asceniv   2. Check IgA/G/M    3. Obtain modified barium swallow for aspiration  4. Obtain nocturnal oximetry study while using CPAP + O2  5. Return to clinic in 12 weeks or earlier if problem  6. Influenza =  Tamiflu . Covid = Paxlovid  We will check Zaniya's immunoglobulin levels to make sure that she is in a good range as a result of her immunoglobulin infusions.  She has had several pneumonias and we will check a modified barium swallow to look at swallowing dynamics and aspiration.  She has sleep apnea treated with CPAP and oxygen  supplementation and we will make sure that she is getting adequate oxygen  while she sleeps.  I will contact her with the results of these test once they are available for review.  Camellia Denis, MD Allergy  / Immunology [facility]

## 2024-01-15 ENCOUNTER — Encounter: Payer: Self-pay | Admitting: Allergy and Immunology

## 2024-01-15 LAB — IGG, IGA, IGM
IgG (Immunoglobin G), Serum: 922 mg/dL (ref 586–1602)
IgM (Immunoglobulin M), Srm: 124 mg/dL (ref 26–217)
Immunoglobulin A, (IgA) QN, Serum: 126 mg/dL (ref 64–422)

## 2024-01-20 ENCOUNTER — Ambulatory Visit: Payer: Self-pay | Admitting: Allergy and Immunology

## 2024-01-21 ENCOUNTER — Telehealth (HOSPITAL_COMMUNITY): Payer: Self-pay | Admitting: Allergy and Immunology

## 2024-01-21 DIAGNOSIS — G4733 Obstructive sleep apnea (adult) (pediatric): Secondary | ICD-10-CM | POA: Diagnosis not present

## 2024-01-21 DIAGNOSIS — J471 Bronchiectasis with (acute) exacerbation: Secondary | ICD-10-CM | POA: Diagnosis not present

## 2024-01-21 NOTE — Telephone Encounter (Signed)
 Second attempt to reach patient at [phone] to schedule swallow test. Mailbox was full/unable to leave call back request. If patient see's this message, please call us  at [phone]. (AHARRIS)

## 2024-01-27 ENCOUNTER — Telehealth (HOSPITAL_COMMUNITY): Payer: Self-pay | Admitting: *Deleted

## 2024-01-27 NOTE — Telephone Encounter (Signed)
 Attempted to contact patient to schedule OP MBS. Patient's VM @ [phone] continues to be full and unavailable. Left VM on son's # [phone] requesting a call back. RKEEL

## 2024-01-28 NOTE — Telephone Encounter (Signed)
 Will call [facility] and [facility] tomorrow to get these things taken care of for the patient.

## 2024-01-29 NOTE — Telephone Encounter (Signed)
 Called and spoke with [facility], they need the order for the Pulse Oximetry, Overnight to be faxed to [phone] to the attention of [facility]. I have faxed the order over to their office. I called and spoke with Rehab at [facility] and received the phone number for acute rehab at [phone]. I called the provided phone number and left a voicemail asking for a return call to discuss scheduling the patient for the Modified Barium Swallow at [facility].

## 2024-01-30 NOTE — Telephone Encounter (Signed)
 Can you please call [phone] located at [address] to [place],  [place]  [address] to see if they can perform a modified barium swallow for the patient please? If so we can place a referral for the patient to go there to get this done since we are not having much luck hearing back from acute rehab at [facility].

## 2024-01-31 ENCOUNTER — Other Ambulatory Visit (HOSPITAL_COMMUNITY): Payer: Self-pay | Admitting: Allergy and Immunology

## 2024-01-31 ENCOUNTER — Telehealth: Payer: Self-pay

## 2024-01-31 DIAGNOSIS — R059 Cough, unspecified: Secondary | ICD-10-CM

## 2024-01-31 DIAGNOSIS — R1310 Dysphagia, unspecified: Secondary | ICD-10-CM

## 2024-01-31 NOTE — Telephone Encounter (Signed)
 Spoke with patient--DOB verified--informed her of the date/time for modified barium swallow study and gave patient the number to acute rehab if date/time needed to be changed. Verbalized understanding.

## 2024-01-31 NOTE — Telephone Encounter (Signed)
 Called daughter in regards to Modified Barium Swallow. Was able to get her scheduled on 03/04/23 at 1:00p with arrival time of 12:45p at [facility]. No food/drink restrictions, use main entrance and ask for radiology.   Please call [phone] if date and time does not work with patients schedule.   If/when patient calls, please advise of above notation.

## 2024-02-10 NOTE — Telephone Encounter (Signed)
 PT called to inquire about issues with not having access to a laptop - reviewed with Beth who advised PT should reach out to Barium Swallow study.  Provided [phone] call back to PT, she thanked

## 2024-02-11 ENCOUNTER — Telehealth: Payer: Self-pay | Admitting: Allergy and Immunology

## 2024-02-11 NOTE — Telephone Encounter (Signed)
 Ebony Scott called and wants to speak with someone on the clinical staff about the results she got from her oxygen  meter (I believe that is what she said) from last night.

## 2024-02-18 NOTE — Telephone Encounter (Signed)
 I called and spoke with the patient. She stated that she hasn't not received any results yet. She said the name of the equipment was [employer] and the phone number is [phone]. I advised that I would call tomorrow to see what I can figure out and I will call her back with an update. Patient verbalized understanding.

## 2024-02-19 NOTE — Telephone Encounter (Signed)
 I called and spoke with [facility]. They stated that once they receive the equipment back they will be able to process the results and have them sent to our office. When I spoke with the patient yesterday she was in the process of sending back the equipment. I called and informed the patient that once they have the equipment back and review the results they will send them to our office and we will contact her. Patient verbalized understanding.

## 2024-03-03 ENCOUNTER — Encounter (HOSPITAL_COMMUNITY): Payer: Self-pay

## 2024-03-03 ENCOUNTER — Ambulatory Visit (HOSPITAL_COMMUNITY): Admission: RE | Admit: 2024-03-03 | Source: Ambulatory Visit

## 2024-03-03 ENCOUNTER — Ambulatory Visit (HOSPITAL_COMMUNITY)
Admission: RE | Admit: 2024-03-03 | Discharge: 2024-03-03 | Disposition: A | Discharge status: Home / Self Care | Source: Ambulatory Visit | Attending: Family Medicine | Admitting: Family Medicine

## 2024-03-03 DIAGNOSIS — R1310 Dysphagia, unspecified: Secondary | ICD-10-CM

## 2024-03-03 DIAGNOSIS — R059 Cough, unspecified: Secondary | ICD-10-CM

## 2024-03-03 NOTE — Progress Notes (Unsigned)
 "  Office Visit Note  Patient: Ebony Scott             Date of Birth: Feb 11, 1951           MRN: [id number]             PCP: [name], MD Referring: [name], MD Visit Date: 03/16/2024   Subjective:  No chief complaint on file.   History of Present Illness: Ebony Scott is a 74 y.o. female here for follow up with seropositive rheumatoid arthritis and osteoarthritis on prednisone  5 mg daily.   Previous HPI 09/13/2023 Ebony Scott is a 74 y.o. female here for follow up with seropositive rheumatoid arthritis and osteoarthritis on prednisone  5 mg daily.   She experiences persistent pain in her feet and fingers, with constant pain in her feet and significant pain in one finger. She is unable to assess swelling in her feet due to wearing compression socks. She requires surgery on the left shoulder, which has not yet been performed.   She is currently taking 5 mg of prednisone  daily. She has not been using topical medications like Voltaren  recently, as they are not available at her current facility, although she previously used them at home.   She recently underwent a procedure involving the removal or adjustment of a defibrillator and some leads. She is uncertain if further procedures are needed and has not had a follow-up since the procedure earlier this month.   She is on intravenous antibiotics and has a PICC line in place, which imposes restrictions and causes difficulty in performing certain activities.   She needs toenail care, which she typically receives through a manicure and pedicure, but is currently unable to access.         Previous HPI 05/13/2023 Ebony Scott is a 74 year old female with seropositive rheumatoid arthritis and osteoarthritis on prednisone  7 mg daily who presents with joint pains, recent fall, and recent respiratory infections.   She experiences persistent joint pain, particularly in her fingers, with one finger that 'hurts all the time.' An  injection in the finger last year did not provide significant relief. Shoulder pain, previously treated with an injection, was initially helpful but has since returned.   Regarding her rheumatoid arthritis, there have been no major flare-ups or joint swelling recently, with symptoms described as 'just the usual.' She is currently taking 50 mg of tramadol  daily, which she finds helpful, and also takes Tylenol  650 mg, two tablets in the morning and two in the evening.   She has been experiencing recurrent pneumonia, with the most recent episode occurring within the past couple of months. She is currently on a prednisone  dose of seven milligrams, which has raised concerns about her susceptibility to infections.   She experienced a fall recently when she slipped in the mud, but reports no other recent falls. No shooting pain down her arm or finger triggering. She also denies pain in her thumb or other fingers, except for the one that is sore.    Recent labs reviewed 05/02/23 CBC okay CMP eGFR 43 Rest okay       Previous HPI 12/11/2022 Ebony Scott is a 74 y.o. female here for follow up for seropositive RA on prednisone  7 mg daily.  Unfortunate since the last visit she had pneumonia twice with exacerbation of her chronic bronchitis and is currently at facility for rehab after hospitalization.  Steroid injection to the finger at her last  visit did not help much with regards to pain swelling or joint mobility.  She still noticing some redness and sometimes warmth to the joints near the tips of her fingers.  Worst problem currently is left shoulder pain.  She has difficulty reaching overhead also has pain worse at nighttime sometimes disrupting sleep.  Does not have symptoms radiating down the arm.   Previous HPI 09/10/2022 Ebony Scott is a 74 y.o. female here for follow up for seropositive RA on prednisone  7 mg daily.  She had recent hospitalization for fevers and hypotension without clear infectious  source identified but concern of HCAP as this followed shortly after ICD placement on 7/8. She was treated with rocephin  and azithromycin  and transitioned to cefadroxil  and azithromycin  at discharge. She completed antibiotics but still has some diarrhea with frequent, watery stools though without any blood or mucus contents. She thinks the left shoulder steroid injection in April helped, but is currently limited unable to move her shoulder much after the recent ICD placement. Worst joint at present is right 5th PIP joint is very painful with movement and sometimes at rest.   Previous HPI 05/22/2022 Ebony Scott is a 74 y.o. female here for follow up for seropositive RA on prednisone  7 mg daily and we started hydroxychloroquine  200 mg daily.  Unfortunately did not see any significant benefit to joint inflammation with additional medication added.  She did get sick with community-acquired pneumonia resulting in hospitalization on March 6.  Respiratory status improved with treatment but unfortunately had a prolonged course 10 days until discharge.  Then had to go to short-term at rehab facility before returning home last weekend.  She was visited by home health physical therapy once on Easter Sunday but has not had any additional follow-up or treatment.  She feels overall weak and mobility is much worse compared to prior to hospitalization.  Also developed new erythematous skin rash involving abdomen and neck.   Previous HPI 03/07/22 Ebony Scott is a 74 y.o. female here for follow up for seropositive RA on prednisone  7 mg daily. She has increased joint pain in multiple areas especially bilateral hands also left shoulder and right knee. She has seen only a small benefit in symptoms so far after the knee viscosupplementation shots last year. She has pain pretty much all the time both morning and night time. She notices some worsening of lateral deviation in the small finger on her right hand.    Previous  HPI 11/29/21 Ebony Scott is a 74 y.o. female here for follow up for seronegative RA and cushing's syndrome currently on long term prednisone . She is on hizentra  for hypogammaglobulinemia. Since last visit she had a bone marrow biopsy for macrocytosis with benign findings. She is having an exacerbation of asthma with wheezing and coughing increased since early September and did not improve well with a prolonged steroid taper. Subsequently has not been prescribed azithromycin  and higher dose steroid starting about 5 days ago so far still having a lot of coughing and symptoms. She notices increased joint pain in hands and knees during this episode. She completed a series of 3 viscosupplementation injections in the left knee about 2 weeks ago with a partial benefit.   Previous HPI 08/09/2021 Ebony Scott is a 74 y.o. female here for follow up or seronegative RA and cushing's syndrome on prednisone  with gradual tapering but increased back to 7 mg daily after last visit.  She is having some increased joint pain at  multiple areas but worst problem is increased pain in her left knee.  This is severe enough to limit her tolerance for weightbearing and mobility.  She is continuing treatments for hypogammaglobulinemia with the Hizentra  subcu weekly.  She has not had any severe interval infections.  She is scheduled for hammertoe corrective surgery next month.   Previous HPI 04/28/21 Ebony Scott is a 74 y.o. female here for follow up for seronegative RA and cushing's syndrome on prednisone  with gradual tapering from 8 mg in December. She developed worsening iron  deficiency anemia after GI bleed getting venofer  replacement. Joint and skin problems were remaining pretty stable until about 2 weeks ot a month ago. This was after decreasing prednisone  to 6 mg daily dose. Knee pain limits her mobility but hands have the most increase in swelling. Skin rash on the back of her neck and throughout her low back. She tried  topical hydrocortisone  without a large improvement so far.   Previous HPI 01/23/21 Ebony Scott is a 74 y.o. female here for follow up for seronegative RA minimally responsive to multiple DMARD treatments on prednisone  10 mg PO daily with acquired cushing's syndrome. She has noticed some increase in right hand MCP joint swelling but overall joints not dramatically changed with dose reduction. Since our visit she went for coronary artery stent placement after study for evaluating cardiomyopathy found significant stenosis. No anginal symptoms, she is not sure if any different but husband reports noticing some improvement in her dyspnea on exertion. She has had numerous other issues increased upper airway congestion and drainage. Sometimes productive coughing with this. She has had mild nasal bleeding with rhinitis and and sneezing issues.   Previous HPI 11/28/20 Ebony Scott is a 74 y.o. female here for iatrogenic cushing syndrome and history of rheumatoid arthritis with long term prednisone  treatment, previously seen by Dr. Curt at [facility] for this problem. Mostly had been taking treatments after hospitalization with severe joint inflammation in 2020. She has tried taking numerous DMARD treatments for years including actemra , simponi , abatacept, and rituximab  so far without great response or tolerance. Chronic prednisone  required for symptoms throughout this time but has taken these longstanding for asthma disease already. She has had some issue with recurrent infections with pulmonary and septic bursitis and more recently noninfectious allergy  symptoms managed with Dr. Kozlow and Kara. She started on IVIG treatments with Dr. Kozlow for this identified recently, not sure if primary or related to previous medication   DMARD Hx Leflunomide  Hydroxychloroquine  Rituximab  Abatacept Simponi  Actemra  IV   No Rheumatology ROS completed.   PMFS History:  Patient Active Problem List   Diagnosis Date Noted    CAD S/P percutaneous coronary angioplasty 12/28/2023   Vaginal candidiasis 12/28/2023   CAP (community acquired pneumonia) 12/25/2023   Pacemaker infection 09/04/2023   Medication management 09/03/2023   PICC (peripherally inserted central catheter) in place 09/03/2023   Enterococcus faecalis infection 08/08/2023   Enterococcal bacteremia 08/08/2023   Community acquired pneumonia 08/06/2023   Generalized weakness 08/06/2023   AKI (acute kidney injury) 08/06/2023   Acute metabolic acidosis 05/20/2023   RLL pneumonia 05/18/2023   Nocturnal hypoxia - 2-3 L/min 05/18/2023   Acute respiratory failure with hypoxia (HCC) 05/18/2023   Current chronic use of systemic steroids - prednisone  7 mg daily for hx of RA 05/18/2023   Aspiration pneumonia (HCC) 03/29/2023   Hypogammaglobulinemia 11/30/2022   Sepsis due to pneumonia (HCC) 11/30/2022   Fever 09/01/2022   DNR (do not resuscitate) 08/31/2022  Noninfectious otitis externa of left ear 06/08/2021   Iron  deficiency anemia 02/24/2021   GAVE (gastric antral vascular ectasia)    Anemia associated with stage 3 chronic renal failure (HCC) 02/04/2021   Anticoagulated    Antiplatelet or antithrombotic long-term use    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   Coronary artery calcification seen on CAT scan 01/11/2021   History of DVT (deep vein thrombosis) 09/24/2020   DMII (diabetes mellitus, type 2) (HCC) 09/23/2020   Hoarseness 09/09/2020   Acute metabolic encephalopathy 02/24/2020   S/P reverse total shoulder arthroplasty, right    DOE (dyspnea on exertion) 02/18/2020   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   S/P reverse total shoulder arthroplasty, left 02/16/2020   History of pulmonary embolus (PE) 12/19/2018   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   Rheumatoid arthritis (HCC)    Bilateral impacted cerumen 08/18/2018   Hypothyroidism 01/14/2018   Depression 01/14/2018   Debility    Protein-calorie malnutrition,  moderate 12/17/2017   Knee pain    History of breast cancer    Chronic pain syndrome    Fibromyalgia    Graves disease    Dysphonia 08/10/2017   Pulmonary nodules    Chronic kidney disease, stage 3b (HCC) - baseline Scr 1.3-1.5 04/15/2016   Hx of adenomatous colonic polyps 04/12/2016   Opacity of lung on imaging study 11/03/2015   Severe persistent asthma (HCC) 02/08/2015   Facial pain syndrome 02/08/2015   Insomnia 02/08/2015   Other allergic rhinitis 02/08/2015   LPRD (laryngopharyngeal reflux disease) 02/08/2015   Chronic systolic heart failure (HCC) 02/08/2015   Diastolic dysfunction 02/08/2015   Benign paroxysmal positional vertigo 12/03/2012   Disequilibrium 10/29/2012   Pseudogout 02/24/2012   Irritable bowel syndrome 01/02/2010   Hyperlipidemia 12/23/2009   Hyperthyroidism 11/12/2006   Seasonal and perennial allergic rhinitis 11/12/2006   GERD 11/12/2006   NECK PAIN, CHRONIC 11/12/2006   OSTEOPOROSIS 11/12/2006   BREAST CANCER, HX OF 11/12/2006    Past Medical History:  Diagnosis Date   Allergy     Anemia    Angio-edema    Breast cancer (HCC) 1998   Left breast, in remission   Bronchiectasis (HCC)    Cataract    CHF (congestive heart failure) (HCC)    Closed displaced fracture of metatarsal bone of right foot 01/20/2019   Closed fracture of fifth metatarsal bone of right foot, initial encounter 01/15/2018   Clostridium difficile colitis 01/14/2018   Coronary artery disease    Depression    some; don't take anything for it (02/20/2012), pt denies dx as of 06/02/21   Diverticulosis    DVT (deep venous thrombosis) (HCC)    Fibromyalgia 11/2011   Fracture of base of fifth metatarsal bone with routine healing, left    GAVE (gastric antral vascular ectasia)    GERD (gastroesophageal reflux disease)    Headache(784.0)    related to allergies; more at different times during the year (02/20/2012)   Hemorrhoids    Hiatal hernia    back and neck   History of blood  transfusion 2023   1 unit, 5 units of Iron    Hx of adenomatous colonic polyps 04/12/2016   Hypercholesteremia    good cholesterol is high   Hypothyroidism    h/o Graves disease   IBS (irritable bowel syndrome)    Moderate persistent asthma    -FeV1 72% 2011, -IgE 102 2011, CT sinus Neg 2011   Osteomyelitis of second toe of right foot (HCC) 09/23/2020   Osteoporosis  on reclast  yearly   Oxygen  deficiency    Pneumonia    Pulmonary embolism (HCC) 07/29/2018   Septic olecranon bursitis of right elbow 09/22/2020   Seronegative rheumatoid arthritis (HCC)    Dr. Cindy Setter   Sleep apnea    uses cpap nightly   Tracheobronchomalacia    Vasculitis 12/13/2017    Family History  Problem Relation Age of Onset   Allergies Mother    Heart disease Mother    Arthritis Mother    Lung cancer Mother        heavy smoker   Diabetes Mother    Cancer Mother    Miscarriages / Stillbirths Mother    Obesity Mother    Allergies Father    Heart disease Father    Arthritis Father    Stroke Father    Alcohol  abuse Father    Hypertension Father    Heart disease Brother    Diabetes Maternal Grandfather    Colitis Daughter    Colon cancer Other        Maternal half uncle   Heart disease Brother    Drug abuse Brother    Early death Brother    Drug abuse Brother    Past Surgical History:  Procedure Laterality Date   ABDOMINAL HYSTERECTOMY N/A    Phreesia 10/21/2019   ANTERIOR AND POSTERIOR REPAIR  1990's   APPENDECTOMY     ARGON LASER APPLICATION  02/05/2021   Procedure: ARGON LASER APPLICATION;  Surgeon: Leigh Elspeth SQUIBB, MD;  Location: [facility];  Service: Gastroenterology;;   BREAST LUMPECTOMY  1998   left   BREAST SURGERY N/A    Phreesia 10/21/2019   BRONCHIAL WASHINGS  04/05/2020   Procedure: BRONCHIAL WASHINGS;  Surgeon: Kara Dorn NOVAK, MD;  Location: [facility];  Service: Pulmonary;;   CAPSULOTOMY Right 08/28/2021   Procedure: CAPSULOTOMY;  Surgeon: Tobie Franky SQUIBB, DPM;  Location: [facility];  Service: Podiatry;  Laterality: Right;   CARPOMETACARPEL (CMC) FUSION OF THUMB WITH AUTOGRAFT FROM RADIUS  ~ 2009   both thumbs (02/20/2012)   CATARACT EXTRACTION W/ INTRAOCULAR LENS  IMPLANT, BILATERAL  2012   CERVICAL DISCECTOMY  10/2001   C5-C6   CERVICAL FUSION  2003   C3-C4   CHOLECYSTECTOMY     COLONOSCOPY     CORONARY STENT INTERVENTION N/A 01/11/2021   Procedure: CORONARY STENT INTERVENTION;  Surgeon: Anner Alm ORN, MD;  Location[facility] INVASIVE CV LAB;  Service: Cardiovascular;  Laterality: N/A;   DEBRIDEMENT TENNIS ELBOW  ?1970's   right   ESOPHAGOGASTRODUODENOSCOPY     ESOPHAGOGASTRODUODENOSCOPY (EGD) WITH PROPOFOL  N/A 02/05/2021   Procedure: ESOPHAGOGASTRODUODENOSCOPY (EGD) WITH PROPOFOL ;  Surgeon: Leigh Elspeth SQUIBB, MD;  Location: [facility];  Service: Gastroenterology;  Laterality: N/A;   EYE SURGERY N/A    Phreesia 10/21/2019   HAMMER TOE SURGERY Right 08/28/2021   Procedure: HAMMER TOE REPAIR 2ND TOE RIGHT FOOT;  Surgeon: Tobie Franky SQUIBB, DPM;  Location: [facility];  Service: Podiatry;  Laterality: Right;   HYSTERECTOMY     I & D EXTREMITY Right 09/22/2020   Procedure: IRRIGATION AND DEBRIDEMENT RIGHT HAND AND ELBOW;  Surgeon: Josefina Chew, MD;  Location: [facility];  Service: Orthopedics;  Laterality: Right;   ICD GENERATOR REMOVAL N/A 08/21/2023   Procedure: ICD GENERATOR REMOVAL;  Surgeon: Waddell Danelle ORN, MD;  Location[facility] INVAS[facility] CV LAB;  Service: Cardiovascular;  Laterality: N/A;   ICD IMPLANT N/A 08/27/2022   Procedure: ICD IMPLANT;  Surgeon: Nancey Eulas BRAVO, MD;  Location: [facility]AS[facility] CV LAB;  Service: Cardiovascular;  Laterality: N/A;   JOINT REPLACEMENT  02/15/2021   KNEE ARTHROPLASTY  ?8009'd   ?right; w/cartilage repair (02/20/2012)   LEAD EXTRACTION N/A 08/21/2023   Procedure: LEAD EXTRACTION;  Surgeon: Waddell Danelle ORN, MD;  Location: [facility];  Service: Cardiovascular;  Laterality: N/A;   MASS EXCISION Left  08/28/2021   Procedure: EXCISION OF SOFT TISSUE  MASS LEFT FOOT;  Surgeon: Tobie Franky SQUIBB, DPM;  Location: [facility];  Service: Podiatry;  Laterality: Left;   NASAL SEPTUM SURGERY  1980's   POSTERIOR CERVICAL FUSION/FORAMINOTOMY  2004   failed initial fusion; rewired  anterior neck (02/20/2012)   REVERSE SHOULDER ARTHROPLASTY Right 02/16/2020   Procedure: REVERSE SHOULDER ARTHROPLASTY;  Surgeon: Josefina Chew, MD;  Location: [facility];  Service: Orthopedics;  Laterality: Right;   RIGHT/LEFT HEART CATH AND CORONARY ANGIOGRAPHY N/A 01/11/2021   Procedure: RIGHT/LEFT HEART CATH AND CORONARY ANGIOGRAPHY;  Surgeon: Anner Alm ORN, MD;  Location: [facility] INVASIVE CV LAB;  Service: Cardiovascular;  Laterality: N/A;   SPINE SURGERY N/A    Phreesia 10/21/2019   TONSILLECTOMY  ~ 1953   TRANSESOPHAGEAL ECHOCARDIOGRAM (CATH LAB) N/A 08/12/2023   Procedure: TRANSESOPHAGEAL ECHOCARDIOGRAM;  Surgeon: Pietro Redell RAMAN, MD;  Location: [facility] INVASIVE CV LAB;  Service: Cardiovascular;  Laterality: N/A;   VESICOVAGINAL FISTULA CLOSURE W/ TAH  1988   VIDEO BRONCHOSCOPY Bilateral 08/23/2016   Procedure: VIDEO BRONCHOSCOPY WITH FLUORO;  Surgeon: Noreen Tonnie BRAVO, MD;  Location: [facility];  Service: Cardiopulmonary;  Laterality: Bilateral;   VIDEO BRONCHOSCOPY N/A 04/05/2020   Procedure: VIDEO BRONCHOSCOPY WITHOUT FLUORO;  Surgeon: Kara Dorn NOVAK, MD;  Location: [facility];  Service: Pulmonary;  Laterality: N/A;   Social History   Social History Narrative   Patient lives at home alone. Patient  divorced.    Patient has her BS degree.   Right handed.   Caffeine- sometimes coffee.      [facility]:   Born in [place], [place]. She worked as a armed forces operational officer. She has no pets currently. She does have indoor plants. Previously had mold in her home that was remediated. Carpet was removed.          Immunization History  Administered Date(s) Administered   DTaP 08/18/2013   Fluad Quad(high Dose 65+)  10/16/2018, 11/20/2019, 11/03/2020   Hepatitis A 09/04/2007, 03/02/2008   Hepatitis B 01/07/1985, 02/06/1985, 08/17/1985   INFLUENZA, HIGH DOSE SEASONAL PF 11/09/2015, 11/27/2017   Influenza Split 11/13/2010, 11/22/2011, 10/20/2012   Influenza Whole 11/14/2009, 11/21/2011   Influenza, Quadrivalent, Recombinant, Inj, Pf 11/20/2019   Influenza,inj,Quad PF,6+ Mos 11/06/2013, 11/09/2014, 11/06/2016   Influenza-Unspecified 11/06/2021, 10/12/2022   Meningococcal Conjugate 09/04/2007   Moderna Covid-19 Fall Seasonal Vaccine 35yrs & older 01/08/2022   PFIZER(Purple Top)SARS-COV-2 Vaccination 03/12/2019, 04/01/2019, 11/20/2019, 12/16/2019, 06/29/2020   Pfizer Covid-19 Vaccine Bivalent Booster 67yrs & up 03/02/2021   Pneumococcal Conjugate-13 05/18/2013   Pneumococcal Polysaccharide-23 01/05/1994, 11/28/2011, 05/27/2017   Respiratory Syncytial Virus Vaccine,Recomb Aduvanted(Arexvy) 11/06/2021   Td 07/24/1995, 03/16/2005   Tdap 08/18/2013   Zoster Recombinant(Shingrix) 09/18/2017   Zoster, Live 03/02/2008     Objective: Vital Signs: There were no vitals taken for this visit.   Physical Exam   Musculoskeletal Exam: ***   Investigation: No additional findings.  Imaging: No results found.  Recent Labs: Lab Results  Component Value Date   WBC 10.6 01/03/2024   HGB 10.9 (L) 01/03/2024   PLT 264 01/03/2024   NA 140 01/03/2024  K 5.0 01/03/2024   CL 107 01/03/2024   CO2 23 01/03/2024   GLUCOSE 163 (H) 01/03/2024   BUN 23 01/03/2024   CREATININE 1.31 (H) 01/03/2024   BILITOT 0.5 01/03/2024   ALKPHOS 51 12/25/2023   AST 30 01/03/2024   ALT 28 01/03/2024   PROT 7.1 01/03/2024   ALBUMIN 3.1 (L) 12/25/2023   CALCIUM  9.2 01/03/2024   GFRAA 40 (L) 08/25/2020    Speciality Comments: PLQ Eye Exam: 04/18/2023 WNL @[facility] f/u 1 year  Procedures:  No procedures performed Allergies: Baclofen , Other, Porcine (pork) protein-containing drug products, Shellfish allergy , Shrimp  extract, Tetracycline hcl, Xolair  [omalizumab ], Zoledronic  acid, Celecoxib, Dilaudid  [hydromorphone  hcl], Hydrocodone -acetaminophen , Levofloxacin , Molds & smuts, Morphine  and codeine, Oxycodone  hcl, Paroxetine, Tetracycline hcl, Oxycodone -acetaminophen , Diltiazem  hcl, Dust mite extract, Gamunex [immune globulin ], Rituximab , Rofecoxib, and Tree extract   Assessment / Plan:     Visit Diagnoses:  Assessment & Plan Rheumatoid arthritis with negative rheumatoid factor, involving unspecified site (HCC)     Long term (current) use of systemic steroids      ***  Follow-Up Instructions: No follow-ups on file.   Brylan Dec M Khoi Hamberger, CMA  Note - This record has been created using Animal nutritionist.  Chart creation errors have been sought, but may not always  have been located. Such creation errors do not reflect on  the standard of medical care. "

## 2024-03-03 NOTE — Assessment & Plan Note (Signed)
 [name]

## 2024-03-04 ENCOUNTER — Encounter: Payer: Self-pay | Admitting: *Deleted

## 2024-03-06 ENCOUNTER — Ambulatory Visit (HOSPITAL_BASED_OUTPATIENT_CLINIC_OR_DEPARTMENT_OTHER): Admitting: Cardiology

## 2024-03-06 NOTE — Progress Notes (Incomplete)
 " Cardiology Office Note:  .   Date:  03/06/2024  ID:  Ebony Scott, DOB May 10, 1950, MRN [id number] PCP: [name], MD  [facility] Providers Cardiologist:  Shelda Bruckner, MD Electrophysiologist:  Eulas FORBES Furbish, MD {  History of Present Illness: .   Ebony Scott is a 74 y.o. 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 Initial consult was 10/15/17 for incidental coronary artery calcification seen on CT scan.   Today: I have not seen the patient in the office since 10/2021, but she has been followed by EP and the inpatient hospital team intermittently since that time. Unfortunately she had to have ICD extraction 08/21/23 due to enterococcal sepsis, and given this plus comorbidities, decision was made not to reimplant ICD.   ROS: Denies chest pain, shortness of breath at rest or with normal exertion. No PND, orthopnea, LE edema or unexpected weight gain. No syncope or palpitations. ROS otherwise negative except as noted.   Studies Reviewed: [name]    EKG:       Physical Exam:   VS:  There were no vitals taken for this visit.   Wt Readings from Last 3 Encounters:  01/03/24 128 lb 6.4 oz (58.2 kg)  12/29/23 123 lb 10.9 oz (56.1 kg)  12/05/23 123 lb 9.6 oz (56.1 kg)    GEN: Well nourished, well developed in no acute distress HEENT: Normal, moist mucous membranes NECK: No JVD CARDIAC: regular rhythm, normal S1 and S2, no rubs or gallops. No murmur. VASCULAR: Radial and DP pulses 2+ bilaterally. No carotid bruits RESPIRATORY:  Clear to auscultation without rales, wheezing or rhonchi  ABDOMEN: Soft, non-tender, non-distended MUSCULOSKELETAL:  Ambulates independently SKIN: Warm and dry, no edema NEUROLOGIC:  Alert and oriented x 3. No focal neuro deficits  noted. PSYCHIATRIC:  Normal affect    ASSESSMENT AND PLAN: .    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 No plans to reimplant   -counseled on heart failure education -current medications: furosemide  20 mg daily, empagliflozin  10 mg daily -has gone between 80 mg and 40 mg valsartan  based on lability of blood pressure readings. Continue current dose -continue isordil -hydralazine , currently only able to take in the evening -was on bisoprolol  2.5 mg in the past, stopped 2/2 hypotension in 10/2020. Consider retrial in the future if BP/HR allow   CAD s/p stent 01/11/21 -PCI to 95% proximal-mid LAD, but this did not normalize her EF. Otherwise minimal disease -asymptomatic -on clopidogrel , aspirin  for 1 year -continue atorvastatin  80 mg daily   Mixed hyperlipidemia with goal LDL <70 -lipids from 12/21/20 reviewed. LDL 64, TG 210 -continue atorvastatin  80 mg  CV risk counseling and prevention -recommend heart healthy/Mediterranean diet, with whole grains, fruits, vegetable, fish, lean meats, nuts, and olive oil. Limit salt. -recommend moderate walking, 3-5 times/week for 30-50 minutes each session. Aim for at least 150 minutes/week. Goal should be pace of 3 miles/hours, or walking 1.5 miles in 30 minutes -recommend avoidance of tobacco products. Avoid excess alcohol .  Dispo: ***  Signed, Shelda Bruckner, MD  Shelda Bruckner, MD, PhD, [facility]  [facility]  [place]  Heart & Vascular at [facility] at [facility] [address], [address] [place], [place] [address] [phone]   "

## 2024-03-11 ENCOUNTER — Other Ambulatory Visit: Payer: Self-pay | Admitting: Cardiology

## 2024-03-12 ENCOUNTER — Other Ambulatory Visit: Payer: Self-pay

## 2024-03-12 DIAGNOSIS — M0600 Rheumatoid arthritis without rheumatoid factor, unspecified site: Secondary | ICD-10-CM

## 2024-03-12 DIAGNOSIS — M797 Fibromyalgia: Secondary | ICD-10-CM

## 2024-03-12 NOTE — Telephone Encounter (Signed)
 Prescription Request  03/12/2024  LOV: 01/03/24  What is the name of the medication or equipment? Rx #: [id number]  gabapentin  (NEURONTIN ) 300 MG capsule [[id number]]    Have you contacted your pharmacy to request a refill? Yes   Which pharmacy would you like this sent to?  [id number] [place], [place] - [address] [place] [address] Phone: [phone] Fax: [phone]    Patient notified that their request is being sent to the clinical staff for review and that they should receive a response within 2 business days.   Please advise at [facility] [phone]  Prescription Request  03/12/2024  LOV: 01/03/24  What is the name of the medication or equipment? famotidine  (PEPCID ) 20 MG tablet [[id number]]   Have you contacted your pharmacy to request a refill? Yes   Which pharmacy would you like this sent to?  [id number] [address], [place] - [address] [place] [address] Phone: [phone] Fax: [phone]    Patient notified that their request is being sent to the clinical staff for review and that they should receive a response within 2 business days.   Please advise at [facility] [phone]

## 2024-03-12 NOTE — Telephone Encounter (Signed)
 Patient called on cell number, no answer, mailbox full. Pepcid  last refilled by allergy  specialist. Will send to PCP for refill.

## 2024-03-13 NOTE — Telephone Encounter (Signed)
 Requested medications are due for refill today.  yes  Requested medications are on the active medications list.  yes  Last refill. Famotidine  05/07/2023 #180 1 rf, Gabapentin  12/29/2023 #30 0 rf  Future visit scheduled.   no  Notes to clinic.  Both rxs signed by other providers.    Requested Prescriptions  Pending Prescriptions Disp Refills   famotidine  (PEPCID ) 20 MG tablet 180 tablet 1     Gastroenterology:  H2 Antagonists Passed - 03/13/2024 10:25 AM      Passed - Valid encounter within last 12 months    Recent Outpatient Visits           2 months ago Pneumonia of left lower lobe due to infectious organism   [place] [facility] [name], MD   5 months ago Chronic systolic CHF (congestive heart failure) [facility])   [place] [facility] [name], MD   7 months ago Dysfunction of both eustachian tubes   [place] [facility] Aletha Bene, MD   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 [facility]   In 3 months Lonni Slain, MD [facility] Heart & Vascular at [facility] [place] [facility]             gabapentin  (NEURONTIN ) 300 MG capsule 30 capsule 0    Sig: Take 1 capsule (300 mg total) by mouth at bedtime.     Neurology: Anticonvulsants - gabapentin  Failed - 03/13/2024 10:25 AM      Failed - Cr in normal range and within 360 days    Creat  Date Value Ref Range Status  01/03/2024 1.31 (H) 0.60 - 1.00 mg/dL Final   Creatinine, POC  Date Value Ref Range Status  03/07/2023 106.3 mg/dL Final    Comment:     ABSTRACTED BY HIM   Creatinine, Urine  Date Value Ref Range Status  05/31/2014 17.81 (L) >20.0 mg/dL Final         Passed - Completed PHQ-2 or PHQ-9 in the last 360 days      Passed - Valid encounter within last 12 months    Recent Outpatient Visits           2 months ago Pneumonia of left lower lobe due to infectious organism   [place] [facility] [name], MD   5 months ago Chronic systolic CHF (congestive heart failure) [facility])   [place] [facility] [name], MD   7 months ago Dysfunction of both eustachian tubes   [place] [facility] Aletha Bene, MD   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 [facility]   In 3 months Lonni Slain, MD [facility] Heart & Vascular at [facility] [place]

## 2024-03-16 ENCOUNTER — Ambulatory Visit: Admitting: Internal Medicine

## 2024-03-16 DIAGNOSIS — Z7952 Long term (current) use of systemic steroids: Secondary | ICD-10-CM

## 2024-03-16 DIAGNOSIS — M0600 Rheumatoid arthritis without rheumatoid factor, unspecified site: Secondary | ICD-10-CM

## 2024-03-17 ENCOUNTER — Other Ambulatory Visit: Payer: Self-pay | Admitting: Family Medicine

## 2024-03-17 ENCOUNTER — Telehealth: Payer: Self-pay

## 2024-03-17 ENCOUNTER — Other Ambulatory Visit: Payer: Self-pay

## 2024-03-17 DIAGNOSIS — M0600 Rheumatoid arthritis without rheumatoid factor, unspecified site: Secondary | ICD-10-CM

## 2024-03-17 DIAGNOSIS — M797 Fibromyalgia: Secondary | ICD-10-CM

## 2024-03-17 MED ORDER — GABAPENTIN 300 MG PO CAPS: 300.0000 mg | ORAL_CAPSULE | Freq: Every day | ORAL | 3 refills | Status: AC

## 2024-03-17 MED ORDER — SACUBITRIL-VALSARTAN 24-26 MG PO TABS: 1.0000 | ORAL_TABLET | Freq: Two times a day (BID) | ORAL | 0 refills | Status: AC

## 2024-03-17 NOTE — Telephone Encounter (Addendum)
 2cd request: Prescription Request  03/17/2024  LOV: 01/03/24  What is the name of the medication or equipment? Rx #: [id number]  gabapentin  (NEURONTIN ) 300 MG capsule [[id number]]    Have you contacted your pharmacy to request a refill? Yes   Which pharmacy would you like this sent to?  [id number] [place], [place] - [address] [place] [address] Phone: [phone] Fax: [phone]    Patient notified that their request is being sent to the clinical staff for review and that they should receive a response within 2 business days.   Please advise at [facility] [phone]  Prescription Request  03/17/2024  LOV: 01/03/24  What is the name of the medication or equipment? famotidine  (PEPCID ) 20 MG tablet [[id number]]   Have you contacted your pharmacy to request a refill? Yes   Which pharmacy would you like this sent to?  [id number] [address], [place] - [address] [place] [address] Phone: [phone] Fax: [phone]    Patient notified that their request is being sent to the clinical staff for review and that they should receive a response within 2 business days.   Please advise at [facility] [phone]

## 2024-03-19 ENCOUNTER — Other Ambulatory Visit: Payer: Self-pay | Admitting: Internal Medicine

## 2024-03-19 DIAGNOSIS — M0600 Rheumatoid arthritis without rheumatoid factor, unspecified site: Secondary | ICD-10-CM

## 2024-03-19 NOTE — Telephone Encounter (Signed)
 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

## 2024-03-26 ENCOUNTER — Other Ambulatory Visit: Payer: Self-pay

## 2024-03-26 ENCOUNTER — Ambulatory Visit: Payer: Self-pay | Admitting: Family Medicine

## 2024-03-26 ENCOUNTER — Telehealth: Payer: Self-pay | Admitting: Family Medicine

## 2024-03-26 DIAGNOSIS — J4550 Severe persistent asthma, uncomplicated: Secondary | ICD-10-CM

## 2024-03-26 MED ORDER — MONTELUKAST SODIUM 10 MG PO TABS: 10.0000 mg | ORAL_TABLET | Freq: Every day | ORAL | 1 refills | Status: AC

## 2024-03-26 NOTE — Telephone Encounter (Signed)
 Prescription Request  03/26/2024  LOV: 01/03/2024  What is the name of the medication or equipment? montelukast  (SINGULAIR ) 10 MG tablet   Have you contacted your pharmacy to request a refill? Yes   Which pharmacy would you like this sent to?  [id number] [address], [place] - [address] [place] [address] Phone: [phone] Fax: [phone]    Patient notified that their request is being sent to the clinical staff for review and that they should receive a response within 2 business days.   Please advise at [facility] [phone]

## 2024-03-26 NOTE — Telephone Encounter (Signed)
 FYI Only or Action Required?: Action required by provider: medication refill request. Gerhardt's Butt Cream, to be sent to mail order pharmacy [facility] - [place], [place] - [address] as pt doesn't drive  Patient was last seen in primary care on 01/03/2024 by [name], MD.  Called Nurse Triage reporting Hemorrhoids.  Symptoms began today.  Interventions attempted: Nothing.  Symptoms are: unchanged.  Triage Disposition: Call PCP When Office is Open (overriding See Physician Within 24 Hours)  Patient/caregiver understands and will follow disposition?: Yes    Pt with increased hemorrhoid pain today. Occurred after an episode of constipation. Constipation mostly resolved No blood. 3-4/10 pain. Mild itching. No fever. Would like to have Gerhardt's Butt Cream refilled as this resolves her symptoms. Not on pts med list. Forwarding refill request to pts PCP office.     Message from Antwanette L sent at 03/26/2024  4:24 PM EST  Reason for Triage: The patient reports that hemorrhoids are causing discomfort and swelling. The patient is requesting Gerhardt's Butt Cream, 1 application   Reason for Disposition  MODERATE-SEVERE rectal pain (i.e., interferes with school, work, or sleep)  Answer Assessment - Initial Assessment Questions 1. SYMPTOM:  What's the main symptom you're concerned about? (e.g., pain, itching, swelling, rash)     Rectal/hemorrhoid pain   2. ONSET: When did the Rectal/hemorrhoid pain start?     Today  3. RECTAL PAIN: Do you have any pain around your rectum? How bad is the pain?  (Scale 0-10; or none, mild, moderate, severe)     3-4/10 pain  4. RECTAL ITCHING: Do you have any itching in this area? How bad is the itching?  (Scale 0-10; or none, mild, moderate, severe)     Mild itching  5. CONSTIPATION: Do you have constipation? If Yes, ask: How often do you have a bowel movement (BM)?  (Normal range: 3 times a day to  every 3 days)  When was your last BM?       Today. Usually goes daily.  6. CAUSE: What do you think is causing the anus symptoms?     Straining from constipation exacerbating hemorrhoids  7. OTHER SYMPTOMS: Do you have any other symptoms?  (e.g., abdomen pain, fever, rectal bleeding, vomiting)     No  8. PREGNANCY: Is there any chance you are pregnant? When was your last menstrual period?     *No Answer*  Protocols used: Rectal Symptoms-A-AH

## 2024-03-27 ENCOUNTER — Other Ambulatory Visit: Payer: Self-pay

## 2024-03-27 ENCOUNTER — Other Ambulatory Visit: Payer: Self-pay | Admitting: Family Medicine

## 2024-03-27 DIAGNOSIS — B3731 Acute candidiasis of vulva and vagina: Secondary | ICD-10-CM

## 2024-03-27 DIAGNOSIS — E0500 Thyrotoxicosis with diffuse goiter without thyrotoxic crisis or storm: Secondary | ICD-10-CM

## 2024-03-27 MED ORDER — GERHARDT'S BUTT CREAM: 1.0000 | TOPICAL_CREAM | Freq: Every day | CUTANEOUS | 1 refills | Status: AC

## 2024-03-31 ENCOUNTER — Ambulatory Visit: Admitting: Allergy and Immunology

## 2024-04-10 ENCOUNTER — Encounter (HOSPITAL_COMMUNITY)

## 2024-05-01 ENCOUNTER — Ambulatory Visit: Admitting: Internal Medicine

## 2024-06-01 ENCOUNTER — Ambulatory Visit (HOSPITAL_BASED_OUTPATIENT_CLINIC_OR_DEPARTMENT_OTHER): Admitting: Cardiology

## 2024-06-26 ENCOUNTER — Ambulatory Visit (HOSPITAL_BASED_OUTPATIENT_CLINIC_OR_DEPARTMENT_OTHER): Admitting: Cardiology

## 2024-12-03 ENCOUNTER — Ambulatory Visit
